# Patient Record
Sex: Female | Born: 1990 | Race: Black or African American | Hispanic: No | Marital: Single | Smoking: Never smoker
Health system: Southern US, Community
[De-identification: ages and names within clinical notes are randomized; demographics above are authoritative.]

## PROBLEM LIST (undated history)

## (undated) ENCOUNTER — Inpatient Hospital Stay (HOSPITAL_COMMUNITY): Payer: Self-pay

## (undated) DIAGNOSIS — I10 Essential (primary) hypertension: Secondary | ICD-10-CM

## (undated) DIAGNOSIS — N186 End stage renal disease: Secondary | ICD-10-CM

## (undated) DIAGNOSIS — I5020 Unspecified systolic (congestive) heart failure: Secondary | ICD-10-CM

## (undated) DIAGNOSIS — F32A Depression, unspecified: Secondary | ICD-10-CM

## (undated) DIAGNOSIS — I428 Other cardiomyopathies: Secondary | ICD-10-CM

## (undated) DIAGNOSIS — A549 Gonococcal infection, unspecified: Secondary | ICD-10-CM

## (undated) DIAGNOSIS — E1110 Type 2 diabetes mellitus with ketoacidosis without coma: Secondary | ICD-10-CM

## (undated) DIAGNOSIS — R197 Diarrhea, unspecified: Secondary | ICD-10-CM

## (undated) DIAGNOSIS — R569 Unspecified convulsions: Secondary | ICD-10-CM

## (undated) DIAGNOSIS — F319 Bipolar disorder, unspecified: Secondary | ICD-10-CM

## (undated) SURGERY — Surgical Case
Anesthesia: *Unknown

---
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
## 2012-02-07 ENCOUNTER — Encounter (HOSPITAL_COMMUNITY): Payer: Self-pay

## 2012-02-07 ENCOUNTER — Inpatient Hospital Stay (HOSPITAL_COMMUNITY)
Admission: AD | Admit: 2012-02-07 | Discharge: 2012-02-07 | Disposition: A | Discharge status: Home / Self Care | Payer: Self-pay | Source: Ambulatory Visit | Attending: Obstetrics & Gynecology | Admitting: Obstetrics & Gynecology

## 2012-02-07 DIAGNOSIS — Z3202 Encounter for pregnancy test, result negative: Secondary | ICD-10-CM | POA: Insufficient documentation

## 2012-02-07 LAB — POCT PREGNANCY, URINE: Preg Test, Ur: NEGATIVE

## 2012-02-07 NOTE — MAU Note (Signed)
Patient presents for pregnancy test LMP 12/19/11.

## 2012-02-07 NOTE — MAU Provider Note (Signed)
S:  21 y.o. G1P0010 presents to MAU for pregnancy test.  Pt LMP was at the end of August but she did not have a period in September.  She denies any vaginal bleeding, pain, n/v, or fever chills.    O: BP 128/75  Pulse 121  Temp 98.1 F (36.7 C)  Resp 18  Ht 5\' 2"  (1.575 m)  Wt 56.065 kg (123 lb 9.6 oz)  BMI 22.61 kg/m2  Breastfeeding? Unknown  A: Negative pregnancy test  P: Reviewed negative pregnancy test with pt D/C home Return to MAU if abdominal pain, n/v, or fever/chills.   Recommend gyn follow up for irregular menses, discussed gyn clinic with pt today Recommend f/u with primary care for diabetes management, other health care needs  Fatima Blank Certified Nurse-Midwife

## 2012-02-12 NOTE — MAU Provider Note (Signed)
Attestation of Attending Supervision of Advanced Practitioner (CNM/NP): Evaluation and management procedures were performed by the Advanced Practitioner under my supervision and collaboration.  I have reviewed the Advanced Practitioner's note and chart, and I agree with the management and plan.  Airanna Partin, MD, FACOG Attending Obstetrician & Gynecologist Faculty Practice, [facility]  

## 2012-02-18 ENCOUNTER — Emergency Department (HOSPITAL_COMMUNITY): Payer: Self-pay

## 2012-02-18 ENCOUNTER — Inpatient Hospital Stay (HOSPITAL_COMMUNITY)
Admission: EM | Admit: 2012-02-18 | Discharge: 2012-02-20 | DRG: 638 | Disposition: A | Discharge status: Home / Self Care | Payer: MEDICAID | Attending: Internal Medicine | Admitting: Internal Medicine

## 2012-02-18 ENCOUNTER — Encounter (HOSPITAL_COMMUNITY): Payer: Self-pay | Admitting: *Deleted

## 2012-02-18 DIAGNOSIS — Z91199 Patient's noncompliance with other medical treatment and regimen due to unspecified reason: Secondary | ICD-10-CM

## 2012-02-18 DIAGNOSIS — D649 Anemia, unspecified: Secondary | ICD-10-CM | POA: Diagnosis present

## 2012-02-18 DIAGNOSIS — R739 Hyperglycemia, unspecified: Secondary | ICD-10-CM

## 2012-02-18 DIAGNOSIS — N289 Disorder of kidney and ureter, unspecified: Secondary | ICD-10-CM

## 2012-02-18 DIAGNOSIS — E108 Type 1 diabetes mellitus with unspecified complications: Secondary | ICD-10-CM | POA: Diagnosis present

## 2012-02-18 DIAGNOSIS — E872 Acidosis, unspecified: Secondary | ICD-10-CM | POA: Diagnosis present

## 2012-02-18 DIAGNOSIS — E109 Type 1 diabetes mellitus without complications: Secondary | ICD-10-CM

## 2012-02-18 DIAGNOSIS — Z9119 Patient's noncompliance with other medical treatment and regimen: Secondary | ICD-10-CM

## 2012-02-18 DIAGNOSIS — D72829 Elevated white blood cell count, unspecified: Secondary | ICD-10-CM | POA: Diagnosis present

## 2012-02-18 DIAGNOSIS — E1110 Type 2 diabetes mellitus with ketoacidosis without coma: Secondary | ICD-10-CM | POA: Diagnosis present

## 2012-02-18 DIAGNOSIS — E1010 Type 1 diabetes mellitus with ketoacidosis without coma: Principal | ICD-10-CM | POA: Diagnosis present

## 2012-02-18 DIAGNOSIS — E875 Hyperkalemia: Secondary | ICD-10-CM | POA: Diagnosis present

## 2012-02-18 LAB — BASIC METABOLIC PANEL
BUN: 22 mg/dL (ref 6–23)
BUN: 21 mg/dL (ref 6–23)
CO2: <7 mEq/L — CL (ref 19–32)
CO2: <7 mEq/L — CL (ref 19–32)
Calcium: 10 mg/dL (ref 8.4–10.5)
Creatinine, Ser: 0.78 mg/dL (ref 0.50–1.10)
Creatinine, Ser: 0.83 mg/dL (ref 0.50–1.10)
GFR calc Af Amer: >90 mL/min (ref >90)
GFR calc non Af Amer: >90 mL/min (ref >90)
Glucose, Bld: 821 mg/dL (ref 70–99)
Glucose, Bld: 484 mg/dL — ABNORMAL HIGH (ref 70–99)
Potassium: 6.2 mEq/L — ABNORMAL HIGH (ref 3.5–5.1)

## 2012-02-18 LAB — URINALYSIS, ROUTINE W REFLEX MICROSCOPIC
Bilirubin Urine: NEGATIVE
Glucose, UA: >1000 mg/dL — AB
Ketones, ur: >80 mg/dL — AB
Leukocytes, UA: NEGATIVE
Nitrite: NEGATIVE
Specific Gravity, Urine: 1.029 (ref 1.005–1.030)
Urobilinogen, UA: 0.2 mg/dL (ref 0.0–1.0)
pH: 5.5 (ref 5.0–8.0)

## 2012-02-18 LAB — CBC WITH DIFFERENTIAL/PLATELET
Basophils Relative: 1 % (ref 0–1)
Eosinophils Absolute: 0 10*3/uL (ref 0.0–0.7)
Eosinophils Relative: 0 % (ref 0–5)
Hemoglobin: 11.2 g/dL — ABNORMAL LOW (ref 12.0–15.0)
Lymphs Abs: 2.7 10*3/uL (ref 0.7–4.0)
MCH: 25.6 pg — ABNORMAL LOW (ref 26.0–34.0)
MCHC: 33.1 g/dL (ref 30.0–36.0)
Monocytes Absolute: 1.6 10*3/uL — ABNORMAL HIGH (ref 0.1–1.0)
Neutro Abs: 18.4 10*3/uL — ABNORMAL HIGH (ref 1.7–7.7)
Neutrophils Relative %: 80 % — ABNORMAL HIGH (ref 43–77)
RDW: 14.2 % (ref 11.5–15.5)

## 2012-02-18 LAB — BLOOD GAS, ARTERIAL
Bicarbonate: 3.1 mEq/L — ABNORMAL LOW (ref 20.0–24.0)
Drawn by: 257701
O2 Saturation: 95.9 %
Patient temperature: 98.6
TCO2: 3 mmol/L (ref 0–100)
pH, Arterial: 7.071 — CL (ref 7.350–7.450)

## 2012-02-18 LAB — GLUCOSE, CAPILLARY
Glucose-Capillary: 583 mg/dL (ref 70–99)
Glucose-Capillary: 387 mg/dL — ABNORMAL HIGH (ref 70–99)
Glucose-Capillary: 320 mg/dL — ABNORMAL HIGH (ref 70–99)

## 2012-02-18 LAB — RAPID URINE DRUG SCREEN, HOSP PERFORMED
Benzodiazepines: NOT DETECTED
Cocaine: NOT DETECTED
Opiates: POSITIVE — AB

## 2012-02-18 LAB — URINE MICROSCOPIC-ADD ON

## 2012-02-18 LAB — D-DIMER, QUANTITATIVE (NOT AT ARMC): D-Dimer, Quant: <0.27 ug/mL-FEU (ref 0.00–0.48)

## 2012-02-18 MED ORDER — SODIUM CHLORIDE 0.9 % IV BOLUS (SEPSIS)
1000.0000 mL | Freq: Once | INTRAVENOUS | Status: AC
  Administered 2012-02-18: 1000 mL via INTRAVENOUS

## 2012-02-18 MED ORDER — SODIUM CHLORIDE 0.9 % IV SOLN
INTRAVENOUS | Status: DC
  Administered 2012-02-18: 8.8 [IU]/h via INTRAVENOUS
  Administered 2012-02-18: 10.5 [IU]/h via INTRAVENOUS
  Administered 2012-02-18: 5.4 [IU]/h via INTRAVENOUS
  Administered 2012-02-18: 7.5 [IU]/h via INTRAVENOUS
  Administered 2012-02-18: 6.5 [IU]/h via INTRAVENOUS
  Administered 2012-02-19: 4.3 [IU]/h via INTRAVENOUS
  Administered 2012-02-19: 0.6 [IU]/h via INTRAVENOUS
  Administered 2012-02-19: 2.6 [IU]/h via INTRAVENOUS
  Filled 2012-02-18: qty 1

## 2012-02-18 MED ORDER — SODIUM CHLORIDE 0.9 % IJ SOLN: 3.0000 mL | Freq: Two times a day (BID) | INTRAMUSCULAR | Status: DC

## 2012-02-18 MED ORDER — SODIUM CHLORIDE 0.9 % IV SOLN
1000.0000 mL | Freq: Once | INTRAVENOUS | Status: AC
  Administered 2012-02-18: 1000 mL via INTRAVENOUS

## 2012-02-18 MED ORDER — ENOXAPARIN SODIUM 40 MG/0.4ML ~~LOC~~ SOLN
40.0000 mg | Freq: Every day | SUBCUTANEOUS | Status: DC
  Administered 2012-02-19 (×2): 40 mg via SUBCUTANEOUS
  Filled 2012-02-18 (×3): qty 0.4

## 2012-02-18 MED ORDER — SODIUM CHLORIDE 0.9 % IV SOLN
1000.0000 mL | INTRAVENOUS | Status: DC
  Administered 2012-02-18: 125 mL/h via INTRAVENOUS
  Administered 2012-02-19: 1000 mL via INTRAVENOUS

## 2012-02-18 MED ORDER — ONDANSETRON HCL 4 MG/2ML IJ SOLN: 4.0000 mg | Freq: Four times a day (QID) | INTRAMUSCULAR | Status: DC | PRN

## 2012-02-18 MED ORDER — MORPHINE SULFATE 4 MG/ML IJ SOLN
4.0000 mg | Freq: Once | INTRAMUSCULAR | Status: AC
  Administered 2012-02-18: 4 mg via INTRAVENOUS
  Filled 2012-02-18: qty 1

## 2012-02-18 MED ORDER — INFLUENZA VIRUS VACC SPLIT PF IM SUSP
0.5000 mL | INTRAMUSCULAR | Status: AC
  Administered 2012-02-19: 0.5 mL via INTRAMUSCULAR
  Filled 2012-02-18: qty 0.5

## 2012-02-18 MED ORDER — STERILE WATER FOR INJECTION IV SOLN
INTRAVENOUS | Status: DC
  Administered 2012-02-19: via INTRAVENOUS
  Filled 2012-02-18: qty 9.7

## 2012-02-18 MED ORDER — ONDANSETRON HCL 4 MG PO TABS: 4.0000 mg | ORAL_TABLET | Freq: Four times a day (QID) | ORAL | Status: DC | PRN

## 2012-02-18 NOTE — ED Notes (Signed)
[id number] Expected date:<BR> Expected time:<BR> Means of arrival:<BR> Comments:<BR> hyperglycemic

## 2012-02-18 NOTE — ED Notes (Signed)
Took patient to the bathroom for urine sample she stated that she could not use the restroom because she already used it.

## 2012-02-18 NOTE — ED Provider Notes (Signed)
History     CSN: [id number] Arrival date & time 02/18/12  1444 First MD Initiated Contact with Patient 02/18/12 1547      Chief Complaint  Patient presents with  . Hyperglycemia    HPI Pt has history of diabetes.  She ran out her medications and last took her dose yesterday.  Pt started having trouble with nausea and feeling lightheaded.  She has had polyuria and has been feeling real thirsty so she started drinking ginger ale.  She is having pain in her chest now. She feels short of breath and it hurts to breathe.  The pain is sharp in nature.  No fever or cough.  No history of DVT or PE.  No history of heart problems.  She is very tearful and upset.  Pt often feels this way when her blood sugar gets high.  She denies pregnancy at this time.    Past Medical History  Diagnosis Date  . Gonorrhea 08/2011    Not treated  . Diabetes mellitus 2001    Diagnosed at age 39 ; Type I    Past Surgical History  Procedure Date  . No past surgeries     Family History  Problem Relation Age of Onset  . Anesthesia problems Neg Hx   . Asthma Mother   . Gout Father   . Diabetes Paternal Grandmother     History  Substance Use Topics  . Smoking status: Never Smoker   . Smokeless tobacco: Never Used  . Alcohol Use: No    OB History    Grav Para Term Preterm Abortions TAB SAB Ect Mult Living   1 0   1  1   0      Review of Systems  Genitourinary: Negative for vaginal bleeding.  All other systems reviewed and are negative.    Allergies  Penicillins  Home Medications   Current Outpatient Rx  Name Route Sig Dispense Refill  . ACETONE (URINE) TEST VI STRP Does not apply 1 strip by Does not apply route as needed. 25 each 2  . GLUCOSE BLOOD VI STRP  Use as instructed 100 each 12  . INSULIN ASPART 100 UNIT/ML [place] SOLN Subcutaneous Inject 3-10 Units into the skin 3 (three) times daily with meals. Sliding scale    . INSULIN ISOPHANE HUMAN 100 UNIT/ML [place] SUSP Subcutaneous Inject 20  Units into the skin 2 (two) times daily. Inject 20 units before breakfast and dinner      BP 141/98  Pulse 137  Temp 98 F (36.7 C) (Oral)  Resp 20  SpO2 100%  Breastfeeding? Unknown  Physical Exam  Nursing note and vitals reviewed. Constitutional: She appears well-developed and well-nourished. She appears distressed.       tearful  HENT:  Head: Normocephalic and atraumatic.  Right Ear: External ear normal.  Left Ear: External ear normal.  Mouth/Throat: No oropharyngeal exudate (dry mm).  Eyes: Conjunctivae normal are normal. Right eye exhibits no discharge. Left eye exhibits no discharge. No scleral icterus.  Neck: Neck supple. No tracheal deviation present.  Cardiovascular: Regular rhythm and intact distal pulses.  Tachycardia present.   Pulmonary/Chest: Effort normal and breath sounds normal. No stridor. No respiratory distress. She has no wheezes. She has no rales. She exhibits tenderness (right sided).  Abdominal: Soft. Bowel sounds are normal. She exhibits no distension. There is no tenderness. There is no rebound and no guarding.  Musculoskeletal: She exhibits no edema and no tenderness.  Neurological: She is alert.  She has normal strength. No sensory deficit. Cranial nerve deficit: no gross deficits. She exhibits normal muscle tone. She displays no seizure activity. Coordination normal.  Skin: Skin is warm and dry. No rash noted.  Psychiatric: She has a normal mood and affect.    ED Course  Procedures (including critical care time)  Rate: 124  Rhythm: Sinus tachycardia  QRS Axis: normal  Intervals: normal  ST/T Wave abnormalities: normal  Conduction Disutrbances:none  Narrative Interpretation: Rate faster but prior EKG showed a heart rate of 146  Old EKG Reviewed: Rate slower  Medications  insulin aspart (NOVOLOG) 100 UNIT/ML injection (not administered)  insulin NPH (HUMULIN N,NOVOLIN N) 100 UNIT/ML injection (not administered)  0.9 %  sodium chloride infusion (0  mL Intravenous Stopped 02/18/12 1801)    Followed by  0.9 %  sodium chloride infusion (0 mL Intravenous Stopped 02/18/12 1814)    Followed by  0.9 %  sodium chloride infusion (125 mL/hr Intravenous New Bag/Given 02/18/12 1815)  insulin regular (NOVOLIN R,HUMULIN R) 1 Units/mL in sodium chloride 0.9 % 100 mL infusion (not administered)  morphine 4 MG/ML injection 4 mg (4 mg Intravenous Given 02/18/12 1637)   CRITICAL CARE Performed by: Dorie Rank R Total critical care time: 45 Critical care time was exclusive of separately billable procedures and treating other patients. Critical care was necessary to treat or prevent imminent or life-threatening deterioration. Critical care was time spent personally by me on the following activities: development of treatment plan with patient and/or surrogate as well as nursing, discussions with consultants, evaluation of patient's response to treatment, examination of patient, obtaining history from patient or surrogate, ordering and performing treatments and interventions, ordering and review of laboratory studies, ordering and review of radiographic studies, pulse oximetry and re-evaluation of patient's condition.  Labs Reviewed  URINALYSIS, ROUTINE W REFLEX MICROSCOPIC - Abnormal; Notable for the following:    APPearance CLOUDY (*)     Glucose, UA >1000 (*)     Ketones, ur >80 (*)     Protein, ur 30 (*)     All other components within normal limits  GLUCOSE, CAPILLARY - Abnormal; Notable for the following:    Glucose-Capillary >600 (*)     All other components within normal limits  BLOOD GAS, ARTERIAL - Abnormal; Notable for the following:    pH, Arterial 7.071 (*)     pCO2 arterial 11.1 (*)     pO2, Arterial 125.0 (*)     Bicarbonate 3.1 (*)     Acid-base deficit 27.1 (*)     All other components within normal limits  BASIC METABOLIC PANEL - Abnormal; Notable for the following:    Potassium 6.2 (*)  NO VISIBLE HEMOLYSIS   Chloride 94 (*)      CO2 <7 (*)     Glucose, Bld 821 (*)     All other components within normal limits  URINE MICROSCOPIC-ADD ON - Abnormal; Notable for the following:    Squamous Epithelial / LPF MANY (*)     All other components within normal limits  D-DIMER, QUANTITATIVE  TROPONIN I  POCT PREGNANCY, URINE  CBC  COMPREHENSIVE METABOLIC PANEL  CBC WITH DIFFERENTIAL   Dg Chest 2 View  02/18/2012  *RADIOLOGY REPORT*  Clinical Data: Hyperglycemia.  Weakness.  Vomiting.  CHEST - 2 VIEW  Comparison: 10/17/2011.  Findings:  Cardiopericardial silhouette within normal limits. Mediastinal contours normal. Trachea midline.  No airspace disease or effusion.  IMPRESSION: No active cardiopulmonary disease.   Original Report  Authenticated By: Dereck Ligas, M.D.     1. DKA (diabetic ketoacidosis)     MDM  The patient appears to have recurrent diabetic ketoacidosis. Patient has not had her medications in the last day and I wonder if she has had further noncompliance. At this time there doesn't appear to be any evidence of an acute infection precipitating her illness. She has improved somewhat with IV fluids. I have ordered a insulin infusion. She is hyperkalemic and acidotic and I will treat with fluids and insulin infusion. I discussed the case with Dr. Jamal Collin regarding admission to the ICU. He will consult with the hospitalist service regarding who will be the primary admitting service.  7:27 PM and explained all these findings to the patient. She understands.  She remains tachycardic blood pressure remains normal       Kathalene Frames, MD 02/18/12 1927

## 2012-02-18 NOTE — ED Notes (Signed)
Patient reports she ran out of insulin this morning, states she is feeling anxious.

## 2012-02-18 NOTE — ED Notes (Signed)
Patient was getting her blood redrawn and then went to xray will do EKG when she returns

## 2012-02-18 NOTE — ED Notes (Signed)
Lab called to say that specimen is hemolyzed. Phlebotomy at bedsite to redraw.

## 2012-02-18 NOTE — ED Notes (Signed)
Pt in from home by ems. Pt unable to afford insulin shots, received last insulin around 4pm yesterday. Pt c/o nausea, generalized body pain. Pt did report to ems that she is [redacted]wks pregnant.

## 2012-02-19 DIAGNOSIS — E1110 Type 2 diabetes mellitus with ketoacidosis without coma: Secondary | ICD-10-CM | POA: Diagnosis present

## 2012-02-19 DIAGNOSIS — E872 Acidosis, unspecified: Secondary | ICD-10-CM | POA: Diagnosis present

## 2012-02-19 DIAGNOSIS — D72829 Elevated white blood cell count, unspecified: Secondary | ICD-10-CM | POA: Diagnosis present

## 2012-02-19 DIAGNOSIS — E875 Hyperkalemia: Secondary | ICD-10-CM | POA: Diagnosis present

## 2012-02-19 DIAGNOSIS — D649 Anemia, unspecified: Secondary | ICD-10-CM

## 2012-02-19 DIAGNOSIS — E109 Type 1 diabetes mellitus without complications: Secondary | ICD-10-CM

## 2012-02-19 DIAGNOSIS — N289 Disorder of kidney and ureter, unspecified: Secondary | ICD-10-CM

## 2012-02-19 HISTORY — DX: Anemia, unspecified: D64.9

## 2012-02-19 LAB — BASIC METABOLIC PANEL
BUN: 13 mg/dL (ref 6–23)
CO2: 17 mEq/L — ABNORMAL LOW (ref 19–32)
CO2: 16 mEq/L — ABNORMAL LOW (ref 19–32)
Calcium: 8.8 mg/dL (ref 8.4–10.5)
Calcium: 8.4 mg/dL (ref 8.4–10.5)
Chloride: 107 mEq/L (ref 96–112)
Creatinine, Ser: 0.56 mg/dL (ref 0.50–1.10)
Creatinine, Ser: 0.55 mg/dL (ref 0.50–1.10)
Creatinine, Ser: 0.48 mg/dL — ABNORMAL LOW (ref 0.50–1.10)
GFR calc Af Amer: >90 mL/min (ref >90)
GFR calc Af Amer: >90 mL/min (ref >90)
GFR calc Af Amer: >90 mL/min (ref >90)
GFR calc Af Amer: >90 mL/min (ref >90)
GFR calc non Af Amer: >90 mL/min (ref >90)
GFR calc non Af Amer: >90 mL/min (ref >90)
GFR calc non Af Amer: >90 mL/min (ref >90)
GFR calc non Af Amer: >90 mL/min (ref >90)
Glucose, Bld: 155 mg/dL — ABNORMAL HIGH (ref 70–99)
Glucose, Bld: 218 mg/dL — ABNORMAL HIGH (ref 70–99)
Glucose, Bld: 231 mg/dL — ABNORMAL HIGH (ref 70–99)
Potassium: 4.5 mEq/L (ref 3.5–5.1)
Potassium: 3.7 mEq/L (ref 3.5–5.1)
Sodium: 139 mEq/L (ref 135–145)
Sodium: 138 mEq/L (ref 135–145)
Sodium: 136 mEq/L (ref 135–145)
Sodium: 131 mEq/L — ABNORMAL LOW (ref 135–145)

## 2012-02-19 LAB — GLUCOSE, CAPILLARY
Glucose-Capillary: 136 mg/dL — ABNORMAL HIGH (ref 70–99)
Glucose-Capillary: 137 mg/dL — ABNORMAL HIGH (ref 70–99)
Glucose-Capillary: 139 mg/dL — ABNORMAL HIGH (ref 70–99)
Glucose-Capillary: 147 mg/dL — ABNORMAL HIGH (ref 70–99)
Glucose-Capillary: 171 mg/dL — ABNORMAL HIGH (ref 70–99)
Glucose-Capillary: 181 mg/dL — ABNORMAL HIGH (ref 70–99)
Glucose-Capillary: 202 mg/dL — ABNORMAL HIGH (ref 70–99)
Glucose-Capillary: 211 mg/dL — ABNORMAL HIGH (ref 70–99)
Glucose-Capillary: 218 mg/dL — ABNORMAL HIGH (ref 70–99)
Glucose-Capillary: 399 mg/dL — ABNORMAL HIGH (ref 70–99)

## 2012-02-19 MED ORDER — INSULIN NPH (HUMAN) (ISOPHANE) 100 UNIT/ML ~~LOC~~ SUSP
20.0000 [IU] | Freq: Two times a day (BID) | SUBCUTANEOUS | Status: DC
  Administered 2012-02-19 – 2012-02-20 (×3): 20 [IU] via SUBCUTANEOUS
  Filled 2012-02-19: qty 10

## 2012-02-19 MED ORDER — DEXTROSE-NACL 5-0.9 % IV SOLN: INTRAVENOUS | Status: DC

## 2012-02-19 MED ORDER — INSULIN ASPART 100 UNIT/ML ~~LOC~~ SOLN
0.0000 [IU] | Freq: Three times a day (TID) | SUBCUTANEOUS | Status: DC
  Administered 2012-02-19: 9 [IU] via SUBCUTANEOUS
  Administered 2012-02-20: 3 [IU] via SUBCUTANEOUS
  Administered 2012-02-20: 1 [IU] via SUBCUTANEOUS

## 2012-02-19 MED ORDER — SODIUM CHLORIDE 0.9 % IV SOLN
INTRAVENOUS | Status: DC
  Administered 2012-02-19 – 2012-02-20 (×3): via INTRAVENOUS

## 2012-02-19 MED ORDER — CHLORHEXIDINE GLUCONATE 0.12 % MT SOLN: 15.0000 mL | Freq: Two times a day (BID) | OROMUCOSAL | Status: DC

## 2012-02-19 MED ORDER — BIOTENE DRY MOUTH MT LIQD
15.0000 mL | Freq: Two times a day (BID) | OROMUCOSAL | Status: DC
  Administered 2012-02-19: 15 mL via OROMUCOSAL

## 2012-02-19 MED ORDER — DEXTROSE-NACL 5-0.45 % IV SOLN
INTRAVENOUS | Status: DC
  Administered 2012-02-19: 125 mL/h via INTRAVENOUS
  Administered 2012-02-19: 10:00:00 via INTRAVENOUS

## 2012-02-19 NOTE — Progress Notes (Signed)
Attempted to assess patient, but she was bathing.  Will defer until tomorrow.  Maley Venezia S. Aureliano Oshields, RN, CNS, CDE

## 2012-02-19 NOTE — H&P (Signed)
[facility] History and Physical  NOLIE KIMAK [id number] DOB: 01-24-1991 DOA: 02/18/2012  Referring physician: Dr Alva Garnet PCP: Cresenciano Genre, MD  Specialists: None  Chief Complaint: nausea, vomiting, dizziness.  HPI: Ashley Freeman is a 21 y.o. female with prior h/o Insulin Dependent Diabetes, h/o non compliance to medications, h/o repeated admissions for DKA, came in today complaining of nausea, vomiting and dizziness . On arrival to ED she was found to be in DKA, with blood sugar in 99991111, metabolic acidosis, tachycardic. She denies fever, but reports occasional short ness of breath. She denies any chest pain.  She was initially referred to critical care physician, who felt she can be on hospitalist service for management of DKA. She is a patient of IM teaching service at [facility] to Dr Obie Dredge on call for IM teaching service to transfer the patient to [facility], but patient adamantly refused to go to [facility], hence she is being admitted to step down under hospitalist service for management of DKA.    Review of Systems: The patient denies anorexia, fever, weight loss,, vision loss, decreased hearing, hoarseness, chest pain, syncope, dyspnea on exertion, peripheral edema, balance deficits, hemoptysis, abdominal pain, melena, hematochezia, severe indigestion/heartburn, hematuria, incontinence, genital sores, muscle weakness, suspicious skin lesions, transient blindness, difficulty walking, depression, unusual weight change, abnormal bleeding, enlarged lymph nodes, angioedema, and breast masses.    Past Medical History  Diagnosis Date  . Gonorrhea 08/2011    Not treated  . Diabetes mellitus 2001    Diagnosed at age 19 ; Type I   Past Surgical History  Procedure Date  . No past surgeries    Social History:  reports that she has never smoked. She has never used smokeless tobacco. She reports that she does not drink alcohol or use illicit drugs.  where does  patient live--home,  Allergies  Allergen Reactions  . Penicillins Hives    Family History  Problem Relation Age of Onset  . Anesthesia problems Neg Hx   . Asthma Mother   . Gout Father   . Diabetes Paternal Grandmother     Prior to Admission medications   Medication Sig Start Date End Date Taking? Authorizing Provider  acetone, urine, test strip 1 strip by Does not apply route as needed. 11/07/11  Yes Cresenciano Genre, MD  glucose blood (ACCU-CHEK INSTANT GLUCOSE TEST) test strip Use as instructed 11/07/11 11/06/12 Yes Cresenciano Genre, MD  insulin aspart (NOVOLOG) 100 UNIT/ML injection Inject 3-10 Units into the skin 3 (three) times daily with meals. Sliding scale 10/22/11 10/21/12 Yes Ivor Costa, MD  insulin NPH (HUMULIN N,NOVOLIN N) 100 UNIT/ML injection Inject 20 Units into the skin 2 (two) times daily. Inject 20 units before breakfast and dinner 11/06/11  Yes Bartholomew Crews, MD   Physical Exam: Filed Vitals:   02/18/12 2130 02/18/12 2215 02/18/12 2300 02/18/12 2345  BP: 138/89 130/88 125/82 125/71  Pulse: 149 148 131 130  Temp:    99 F (37.2 C)  TempSrc:    Oral  Resp: 31 30 22 28   Height:    5\' 2"  (1.575 m)  Weight:    54.9 kg (121 lb 0.5 oz)  SpO2: 100% 100% 100% 100%    Constitutional: Vital signs reviewed.  Patient is poorly nourished in mild distress and cooperative with exam. Alert and oriented x3.  Head: Normocephalic and atraumatic Mouth: no erythema or exudates, dry MM Eyes: PERRL, EOMI, conjunctivae normal, No scleral icterus.  Neck: Supple, Trachea  midline normal ROM, No JVD, mass, thyromegaly, or carotid bruit present.  Cardiovascular: tachycardic,  S1 normal, S2 normal, no MRG, pulses symmetric and intact bilaterally Pulmonary/Chest: CTAB, no wheezes, rales, or rhonchi Abdominal: Soft. Non-tender, non-distended, bowel sounds are normal, no masses, organomegaly, or guarding present.  Musculoskeletal: No joint deformities, erythema, or stiffness, ROM full and no  nontender Neurological: A&O x3, Strength is normal and symmetric bilaterally, cranial nerve II-XII are grossly intact, no focal motor deficit, sensory intact to light touch bilaterally.  Skin: Warm, dry and intact. No rash, cyanosis, or clubbing.  Psychiatric: Normal mood and affect.   Labs on Admission:  Basic Metabolic Panel:  Lab 0000000 2222 02/18/12 1752  NA 143 135  K 5.4* 6.2*  CL 104 94*  CO2 <7* <7*  GLUCOSE 484* 821*  BUN 21 22  CREATININE 0.83 0.78  CALCIUM 10.0 10.2  MG -- --  PHOS -- --   Liver Function Tests: No results found for this basename: AST:5,ALT:5,ALKPHOS:5,BILITOT:5,PROT:5,ALBUMIN:5 in the last 168 hours No results found for this basename: LIPASE:5,AMYLASE:5 in the last 168 hours No results found for this basename: AMMONIA:5 in the last 168 hours CBC:  Lab 02/18/12 1752  WBC 22.9*  NEUTROABS 18.4*  HGB 11.2*  HCT 43.5  MCV 85.8  PLT 458*   Cardiac Enzymes:  Lab 02/18/12 1752  CKTOTAL --  CKMB --  CKMBINDEX --  TROPONINI <0.30    BNP (last 3 results) No results found for this basename: PROBNP:3 in the last 8760 hours CBG:  Lab 02/18/12 2325 02/18/12 2229 02/18/12 2128 02/18/12 2025 02/18/12 1504  GLUCAP 320* 387* 498* 583* >600*    Radiological Exams on Admission: Dg Chest 2 View  02/18/2012  *RADIOLOGY REPORT*  Clinical Data: Hyperglycemia.  Weakness.  Vomiting.  CHEST - 2 VIEW  Comparison: 10/17/2011.  Findings:  Cardiopericardial silhouette within normal limits. Mediastinal contours normal. Trachea midline.  No airspace disease or effusion.  IMPRESSION: No active cardiopulmonary disease.   Original Report Authenticated By: Dereck Ligas, M.D.     EKG: sinus tachycardia.  Assessment/Plan Active Problems: 1. DKA: - admit to step down - started the patient on insulin gtt. - on IV fluids and IV sodium bicarbonate for metabolic acidosis.  -  Keep K>4. - clear liquid diet - Q8hr BMP  2. Hyperkalemia: hemolysed sample Repeat  BMP in am.   3. Leukocytosis: probably secondary to dehydration vs stress margination. UA negative for infection. CXR negative for pneumonia. No indication for antibiotics.   4. Anemia , normocytic: anemia panel ordered.   5. Thrombocytosis:  - probably reactive.   6. DVT prophylaxis   Code Status: full code Family Communication: none at bedside Disposition Plan: 2 to 3 days.   Time spent: 84 minutes.  [facility] Pager [phone]  If 7PM-7AM, please contact night-coverage [email or web address] Password [email or web address] 02/19/2012, 12:09 AM

## 2012-02-19 NOTE — Progress Notes (Signed)
Pt transferred to 1414 with tech in wheelchair.

## 2012-02-19 NOTE — Progress Notes (Signed)
Patient ID: Ashley Freeman, female   DOB: 23-Jul-1990, 21 y.o.   MRN: [id number]  [facility] PROGRESS NOTE  Chadae Marina [facility] [id number] DOB: 1991-02-01 DOA: 02/18/2012 PCP: Cresenciano Genre, MD  Brief narrative: Pt is 21 y.o. female with prior h/o Insulin Dependent Diabetes, non compliance to medications, repeated admissions for DKA, who presented to [facility] ED with main concern of nausea, vomiting and dizziness . On arrival to ED she was found to be in DKA, with blood sugar in 99991111, metabolic acidosis, tachycardic.  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 - CBG's better controlled - will be able to d/c insulin drip and transition to subq insulin - will also initiate home insulin regimen - diabetic coordinator consult to address noncompliance  Leukocytosis - unclear etiology and I can not find clear infectious etiology and this could be related to stress and demargination - pt denies any shortness of breath or cough, no urinary concerns - will repeat CBC in AM  Hyperkalemia  - in the setting of metabolic acidosis - now resolved - BMP in AM  Anemia - of chronic disease - Hg and Hct are stable and at pt baseline  Consultants:  None  Procedures/Studies: Dg Chest 2 View 02/18/2012   IMPRESSION:   No active cardiopulmonary disease.     Antibiotics:  None  Code Status: Full Family Communication: Pt at bedside Disposition Plan: Home in 1-2 days if medically stable and electrolyte panel stable  HPI/Subjective: No events overnight.   Objective: Filed Vitals:   02/19/12 0800 02/19/12 0900 02/19/12  1000 02/19/12 1100  BP: 85/43 97/53 105/71 111/71  Pulse: 105 99 103 100  Temp: 98.4 F (36.9 C)     TempSrc: Oral     Resp: 19 19 18 19   Height:      Weight:      SpO2: 100% 99% 99% 100%    Intake/Output Summary (Last 24 hours) at 02/19/12 1204 Last data filed at 02/19/12 1100  Gross per 24 hour  Intake 2122.4 ml  Output   1000 ml  Net 1122.4 ml    Exam:   General:  Pt is alert, follows commands appropriately, not in acute distress  Cardiovascular: Regular rhythm, tachycardic S1/S2, no murmurs, no rubs, no gallops  Respiratory: Clear to auscultation bilaterally, no wheezing, no crackles, no rhonchi  Abdomen: Soft, non tender, non distended, bowel sounds present, no guarding  Extremities: No edema, pulses DP and PT palpable bilaterally  Neuro: Grossly nonfocal  Data Reviewed: Basic Metabolic Panel:  Lab [id number] 02/19/12 0625 02/19/12 0215 02/18/12 2222 02/18/12 1752  NA 136 138 139 143 135  K 3.9 3.5 4.5 5.4* 6.2*  CL 104 107 107 104 94*  CO2 16* 17* 13* <7* <7*  GLUCOSE 218* 132* 155* 484* 821*  BUN 10 10 13 21 22   CREATININE 0.55 0.56 0.55 0.83 0.78  CALCIUM 8.8 8.8 8.8 10.0 10.2  MG -- -- -- -- --  PHOS -- -- -- -- --   CBC:  Lab 02/18/12 1752  WBC 22.9*  NEUTROABS 18.4*  HGB 11.2*  HCT 43.5  MCV 85.8  PLT 458*  Cardiac Enzymes:  Lab 02/18/12 1752  CKTOTAL --  CKMB --  CKMBINDEX --  TROPONINI <0.30   CBG:  Lab 02/19/12 1111 02/19/12 0954 02/19/12 0848 02/19/12 0743 02/19/12 0652  GLUCAP 181* 211* 171* 143* 124*    Recent Results (from the past 240 hour(s))  MRSA PCR SCREENING     Status: Normal   Collection Time   02/18/12 11:44 PM      Component Value Range Status Comment   MRSA by PCR NEGATIVE  NEGATIVE Final      Scheduled Meds:   . sodium chloride  1,000 mL Intravenous Once   Followed by  . sodium chloride  1,000 mL Intravenous Once  . antiseptic oral rinse  15 mL Mouth Rinse q12n4p  . enoxaparin (LOVENOX) injection   40 mg Subcutaneous QHS  . influenza  inactive virus vaccine  0.5 mL Intramuscular Tomorrow-1000  . insulin aspart  0-9 Units Subcutaneous TID WC  . insulin NPH  20 Units Subcutaneous BID AC  .  morphine injection  4 mg Intravenous Once  . sodium chloride  1,000 mL Intravenous Once  . sodium chloride  3 mL Intravenous Q12H  . DISCONTD: chlorhexidine  15 mL Mouth Rinse BID   Continuous Infusions:   . insulin (NOVOLIN-R) infusion 0.6 Units/hr (02/19/12 0654)  . DISCONTD: sodium chloride 1,000 mL (02/19/12 0005)  . DISCONTD: dextrose 5 % and 0.45% NaCl 125 mL/hr at 02/19/12 1023  . DISCONTD: dextrose 5 % and 0.9% NaCl    . DISCONTD:  sodium bicarbonate infusion 1/4 NS 1000 mL 50 mL/hr at 02/19/12 0005     Faye Ramsay, MD  [facility] Pager [phone]  If 7PM-7AM, please contact night-coverage [email or web address] Password [email or web address] 02/19/2012, 12:04 PM   LOS: 1 day

## 2012-02-20 DIAGNOSIS — R7309 Other abnormal glucose: Secondary | ICD-10-CM

## 2012-02-20 DIAGNOSIS — E872 Acidosis: Secondary | ICD-10-CM

## 2012-02-20 LAB — BASIC METABOLIC PANEL
BUN: 7 mg/dL (ref 6–23)
CO2: 21 mEq/L (ref 19–32)
Chloride: 106 mEq/L (ref 96–112)
Creatinine, Ser: 0.47 mg/dL — ABNORMAL LOW (ref 0.50–1.10)
GFR calc Af Amer: >90 mL/min (ref >90)
Glucose, Bld: 159 mg/dL — ABNORMAL HIGH (ref 70–99)
Sodium: 137 mEq/L (ref 135–145)

## 2012-02-20 LAB — CBC
HCT: 30.4 % — ABNORMAL LOW (ref 36.0–46.0)
Hemoglobin: 10.5 g/dL — ABNORMAL LOW (ref 12.0–15.0)
MCV: 81.5 fL (ref 78.0–100.0)
Platelets: 281 10*3/uL (ref 150–400)
RBC: 3.73 MIL/uL — ABNORMAL LOW (ref 3.87–5.11)
RDW: 14.1 % (ref 11.5–15.5)
WBC: 10.3 10*3/uL (ref 4.0–10.5)

## 2012-02-20 LAB — HEMOGLOBIN A1C: Hgb A1c MFr Bld: >20 % — ABNORMAL HIGH (ref <5.7)

## 2012-02-20 LAB — GLUCOSE, CAPILLARY
Glucose-Capillary: 122 mg/dL — ABNORMAL HIGH (ref 70–99)
Glucose-Capillary: 205 mg/dL — ABNORMAL HIGH (ref 70–99)

## 2012-02-20 MED ORDER — INSULIN NPH (HUMAN) (ISOPHANE) 100 UNIT/ML ~~LOC~~ SUSP: 20.0000 [IU] | Freq: Two times a day (BID) | SUBCUTANEOUS | Status: DC

## 2012-02-20 MED ORDER — POTASSIUM CHLORIDE CRYS ER 20 MEQ PO TBCR
40.0000 meq | EXTENDED_RELEASE_TABLET | Freq: Once | ORAL | Status: AC
  Administered 2012-02-20: 40 meq via ORAL
  Filled 2012-02-20: qty 2

## 2012-02-20 MED ORDER — INSULIN ASPART 100 UNIT/ML ~~LOC~~ SOLN: 3.0000 [IU] | Freq: Three times a day (TID) | SUBCUTANEOUS | Status: DC

## 2012-02-20 NOTE — Care Management Note (Signed)
    Page 1 of 1   02/20/2012     1:36:10 PM   CARE MANAGEMENT NOTE 02/20/2012  Patient:  Ashley Freeman, Ashley Freeman   Account Number:  [id number]  Date Initiated:  02/20/2012  Documentation initiated by:  Dessa Phi  Subjective/Objective Assessment:   ADMITTED W/DKA     Action/Plan:   FROM HOME   Anticipated DC Date:  02/20/2012   Anticipated DC Plan:  [place]  CM consult  Medication Assistance  [facility]      Choice offered to / List presented to:             Status of service:  Completed, signed off Medicare Important Message given?   (If response is "NO", the following Medicare IM given date fields will be blank) Date Medicare IM given:   Date Additional Medicare IM given:    Discharge Disposition:  HOME/SELF CARE  Per UR Regulation:  Reviewed for med. necessity/level of care/duration of stay  If discussed at [place] of Stay Meetings, dates discussed:    Comments:  02/20/12 Tamer Baughman RN,BSN NCM [phone] PROVIDED W/COMMUNITY RESOURCES(PCP/[facility],[employer] $4 MED LIST)ALSO PROVIDED W/DISCOUNT XRIPT COUPON,FORM FOR PATIENT ASST PROGRAMS,& DISCOUNT CARD FOR NOVOLOG,& NOVOLIN.QUALIFIES FOR INDIGENT FUNDS IF NEEDED.INFORMED HER OF PHARMACY POLICY-3DY 0000000 USE FOR 74YR/NO NARCOTICS.PATIENT STATES SHE HAS INSULIN @ HOME,& CAN PURCHASE MED ON OWN.

## 2012-02-20 NOTE — Progress Notes (Signed)
D/C instructions with scripts & F/U given to pt who verbalizes understanding. Pt denies w/c and ambulates out of dept with friend. dph

## 2012-02-20 NOTE — Discharge Summary (Signed)
Physician Discharge Summary  Ashley Freeman [id number] DOB: 17-Mar-1991 DOA: 02/18/2012  PCP: Cresenciano Genre, MD  Admit date: 02/18/2012 Discharge date: 02/20/2012  Time spent: >30 minutes  Recommendations for Outpatient Follow-up:  1. Follow with PCP in 2 weeks (Close follow up to her diabetes and medication compliance/insulin dose adjustments; will also need BMET to follow on her electrolytes)  Discharge Diagnoses:  Active Problems:  Diabetes mellitus type 1 (uncontrolled)  DKA (diabetic ketoacidoses)  Metabolic acidosis  Leukocytosis  Anemia  Hyperkalemia   Discharge Condition: stable and improved. Patient will follow with PCP in 2 weeks. Advised to be compliant with her medications and diet; also not to skipped any meals or insulin shots.  Diet recommendation: low carbohydrates diet  Filed Weights   02/18/12 2345  Weight: 54.9 kg (121 lb 0.5 oz)    History of present illness:  Ashley Freeman is a 21 y.o. female with prior h/o Insulin Dependent Diabetes, h/o non compliance to medications, h/o repeated admissions for DKA, came in today complaining of nausea, vomiting and dizziness . On arrival to ED she was found to be in DKA, with blood sugar in [id number], metabolic acidosis, tachycardic. She denies fever, but reports occasional short ness of breath. She denies any chest pain. She was initially referred to critical care physician, who felt she can be on hospitalist service for management of DKA. She is a patient of IM teaching service at [facility] to Dr Obie Dredge on call for IM teaching service to transfer the patient to [facility], but patient adamantly refused to go to cone, hence she is being admitted to step down under hospitalist service for management of DKA.    Hospital Course:  DKA (diabetic ketoacidoses)  - secondary to medication noncompliance  - electrolytes and metabolic panel (anion gap and bicarb, WNL at discharge) - Patient w/o N/V or abdominal  discomfort and tolerate full diet. -Will go home with instructions to be compliant with medication and prescription to acquire her insulin at discharge. -Advise to follow with PCP and also outpatient diabetes coordinator  Diabetes mellitus type 1 (uncontrolled)   - CBG's now better using same home regimen  - Patient encourage to be compliant with her medications and low carb diet -Instructed to follow with PCP and outpatient diabetes coordinator.   Leukocytosis  - 2/2 to stress demargination due to DKA -No sings of infection identified. -Once her DKA resolved, her WBC's return to WNL.  Hyperkalemia  - in the setting of metabolic acidosis  - now resolved   Anemia  - Hg and Hct are stable and at pt baseline -Most likely secondary to ongoing menstrual cycles -Advised to take MV with iron  * rest of her medical problems remains stable and at this point the plan is to continue same medication regimen; patient will follow with PCP in 2 weeks for further evaluation and treatment.  Consultations:  none  Discharge Exam: Filed Vitals:   02/19/12 1456 02/19/12 2110 02/20/12 0636 02/20/12 1032  BP: 99/51 100/52 101/57 97/48  Pulse: 103 63 93 43  Temp: 98.7 F (37.1 C) 98.7 F (37.1 C) 97.3 F (36.3 C) 98.7 F (37.1 C)  TempSrc:  Oral Oral Oral  Resp: 20 20 18 18   Height:      Weight:      SpO2: 100% 98% 100% 100%    General: NAD, afebrile Cardiovascular: RRR, no rubs or gallops Respiratory: CTA bilaterally Abdomen: soft, NT, ND, positive BS Extremities: no edema or  cyanosis Neuro: non focal  Discharge Instructions  Discharge Orders    Future Orders Please Complete By Expires   Discharge instructions      Comments:   -Keep yourself well hydrated -Arrange follow up with PCP in 2 weeks -Take medications as prescribed -Do not skip any meal or insulin shots. -Follow a low carbohydrates diet       Medication List     As of 02/20/2012 12:16 PM    TAKE these  medications         acetone (urine) test strip   1 strip by Does not apply route as needed.      glucose blood test strip   Use as instructed      insulin aspart 100 UNIT/ML injection   Commonly known as: novoLOG   Inject 3-10 Units into the skin 3 (three) times daily with meals. Sliding scale      insulin NPH 100 UNIT/ML injection   Commonly known as: HUMULIN N,NOVOLIN N   Inject 20 Units into the skin 2 (two) times daily. Inject 20 units before breakfast and dinner           Follow-up Information    Follow up with Cresenciano Genre, MD. In 2 weeks.   Contact information:   [address] [place] [address] [phone]           The results of significant diagnostics from this hospitalization (including imaging, microbiology, ancillary and laboratory) are listed below for reference.    Significant Diagnostic Studies: Dg Chest 2 View  02/18/2012  *RADIOLOGY REPORT*  Clinical Data: Hyperglycemia.  Weakness.  Vomiting.  CHEST - 2 VIEW  Comparison: 10/17/2011.  Findings:  Cardiopericardial silhouette within normal limits. Mediastinal contours normal. Trachea midline.  No airspace disease or effusion.  IMPRESSION: No active cardiopulmonary disease.   Original Report Authenticated By: Dereck Ligas, M.D.     Microbiology: Recent Results (from the past 240 hour(s))  MRSA PCR SCREENING     Status: Normal   Collection Time   02/18/12 11:44 PM      Component Value Range Status Comment   MRSA by PCR NEGATIVE  NEGATIVE Final      Labs: Basic Metabolic Panel:  Lab [id number] 02/19/12 1359 02/19/12 0936 02/19/12 0625 02/19/12 0215  NA 137 131* 136 138 139  K 3.0* 3.7 3.9 3.5 4.5  CL 106 100 104 107 107  CO2 21 17* 16* 17* 13*  GLUCOSE 159* 231* 218* 132* 155*  BUN 7 8 10 10 13   CREATININE 0.47* 0.48* 0.55 0.56 0.55  CALCIUM 8.5 8.4 8.8 8.8 8.8  MG -- -- -- -- --  PHOS -- -- -- -- --    CBC:  Lab 02/20/12 0431 02/18/12 1752  WBC 10.3 22.9*    NEUTROABS -- 18.4*  HGB 10.5* 11.2*  HCT 30.4* 43.5  MCV 81.5 85.8  PLT 281 458*   Cardiac Enzymes:  Lab 02/18/12 1752  CKTOTAL --  CKMB --  CKMBINDEX --  TROPONINI <0.30   CBG:  Lab 02/20/12 1137 02/20/12 0739 02/19/12 2108 02/19/12 1701 02/19/12 1403  GLUCAP 205* 122* 234* 399* 217*       Signed:  Mrytle Bento  [facility] 02/20/2012, 12:16 PM

## 2012-02-25 ENCOUNTER — Encounter (HOSPITAL_COMMUNITY): Payer: Self-pay | Admitting: Emergency Medicine

## 2012-02-25 ENCOUNTER — Emergency Department (HOSPITAL_COMMUNITY)
Admission: EM | Admit: 2012-02-25 | Discharge: 2012-02-25 | Disposition: A | Discharge status: Home / Self Care | Payer: Self-pay | Attending: Emergency Medicine | Admitting: Emergency Medicine

## 2012-02-25 DIAGNOSIS — N764 Abscess of vulva: Secondary | ICD-10-CM | POA: Insufficient documentation

## 2012-02-25 DIAGNOSIS — L0291 Cutaneous abscess, unspecified: Secondary | ICD-10-CM

## 2012-02-25 DIAGNOSIS — Z794 Long term (current) use of insulin: Secondary | ICD-10-CM | POA: Insufficient documentation

## 2012-02-25 DIAGNOSIS — Z8619 Personal history of other infectious and parasitic diseases: Secondary | ICD-10-CM | POA: Insufficient documentation

## 2012-02-25 DIAGNOSIS — E109 Type 1 diabetes mellitus without complications: Secondary | ICD-10-CM | POA: Insufficient documentation

## 2012-02-25 MED ORDER — CLINDAMYCIN HCL 150 MG PO CAPS
300.0000 mg | ORAL_CAPSULE | Freq: Once | ORAL | Status: AC
  Administered 2012-02-25: 300 mg via ORAL
  Filled 2012-02-25: qty 2

## 2012-02-25 MED ORDER — CLINDAMYCIN HCL 150 MG PO CAPS: 300.0000 mg | ORAL_CAPSULE | Freq: Three times a day (TID) | ORAL | Status: DC

## 2012-02-25 MED ORDER — TRAMADOL HCL 50 MG PO TABS: 50.0000 mg | ORAL_TABLET | Freq: Four times a day (QID) | ORAL | Status: DC | PRN

## 2012-02-25 NOTE — ED Provider Notes (Signed)
History    This chart was scribed for Carmin Muskrat, MD, MD by Rhae Lerner. The patient was seen in room [facility] and the patient's care was started at 12:53PM.   CSN: [id number]  Arrival date & time 02/25/12  1150   First MD Initiated Contact with Patient 02/25/12 1244      Chief Complaint  Patient presents with  . Abscess    (Consider location/radiation/quality/duration/timing/severity/associated sxs/prior treatment) Patient is a 21 y.o. female presenting with abscess. The history is provided by the patient. No language interpreter was used.  Abscess  Pertinent negatives include no vomiting.   Ashley Freeman is a 21 y.o. female who presents to the Emergency Department complaining of moderate boil in groin onset 2 days ago. Pt denies pus and drainage. Denies fever, vomiting, confusion and disorientation. She reports that she has had a cold for the past 3 days. Pt has hx of DM.   Past Medical History  Diagnosis Date  . Gonorrhea 08/2011    Not treated  . Diabetes mellitus 2001    Diagnosed at age 48 ; Type I    Past Surgical History  Procedure Date  . No past surgeries     Family History  Problem Relation Age of Onset  . Anesthesia problems Neg Hx   . Asthma Mother   . Gout Father   . Diabetes Paternal Grandmother     History  Substance Use Topics  . Smoking status: Never Smoker   . Smokeless tobacco: Never Used  . Alcohol Use: No    OB History    Grav Para Term Preterm Abortions TAB SAB Ect Mult Living   1 0   1  1   0      Review of Systems  Constitutional:       Per HPI, otherwise negative  HENT:       Per HPI, otherwise negative  Eyes: Negative.   Respiratory:       Per HPI, otherwise negative  Cardiovascular:       Per HPI, otherwise negative  Gastrointestinal: Negative for vomiting.  Genitourinary: Negative.   Musculoskeletal:       Per HPI, otherwise negative  Skin: Negative.   Neurological: Negative for syncope.    Allergies    Penicillins  Home Medications   Current Outpatient Rx  Name Route Sig Dispense Refill  . INSULIN ASPART 100 UNIT/ML [place] SOLN Subcutaneous Inject 3-10 Units into the skin 3 (three) times daily with meals. Sliding scale    . INSULIN ISOPHANE HUMAN 100 UNIT/ML [place] SUSP Subcutaneous Inject 20 Units into the skin 2 (two) times daily. Inject 20 units before breakfast and dinner      BP 112/69  Pulse 108  Temp 98.1 F (36.7 C) (Oral)  Resp 20  SpO2 96%  LMP 01/19/2012  Physical Exam  Nursing note and vitals reviewed. Constitutional: She is oriented to person, place, and time. She appears well-developed and well-nourished. No distress.  HENT:  Head: Normocephalic and atraumatic.  Eyes: EOM are normal.  Neck: Neck supple. No tracheal deviation present.  Cardiovascular: Normal rate, regular rhythm and normal heart sounds.   Pulmonary/Chest: Effort normal and breath sounds normal. No respiratory distress. She has no wheezes. She has no rales.  Genitourinary:       There is a raised 2x10 cm tender area left external edge of labia majora. Area of fluctuance in middle  No drainage Labia majora otherwise normal with no appreciable medial findings /  edema / discharge.    Musculoskeletal: Normal range of motion.  Neurological: She is alert and oriented to person, place, and time.  Skin: Skin is warm and dry.  Psychiatric: She has a normal mood and affect. Her behavior is normal.    ED Course  INCISION AND DRAINAGE Date/Time: 02/25/2012 1:35 PM Performed by: Carmin Muskrat Authorized by: Carmin Muskrat Consent: Verbal consent obtained. Written consent not obtained. The procedure was performed in an emergent situation. Risks and benefits: risks, benefits and alternatives were discussed Consent given by: patient Patient identity confirmed: verbally with patient Time out: Immediately prior to procedure a "time out" was called to verify the correct patient, procedure, equipment, support  staff and site/side marked as required. Type: abscess Body area: anogenital Location details: vulva Anesthesia: local infiltration Local anesthetic: lidocaine 1% with epinephrine Anesthetic total: 3 ml Patient sedated: no Scalpel size: 11 Incision type: single with marsupialization Complexity: complex Drainage: purulent Drainage amount: copious Wound treatment: wound left open Packing material: none Patient tolerance: Patient tolerated the procedure well with no immediate complications.   (including critical care time)   COORDINATION OF CARE: 12:56 PM Discussed ED treatment with pt     Labs Reviewed - No data to display No results found.   No diagnosis found.    MDM  I personally performed the services described in this documentation, which was scribed in my presence. The recorded information has been reviewed and considered.  This young female presents after a recent hospitalization for DKA now with concerns over a skin lesion near her left labia majora on exam she is in no distress.  The patient is mildly tachycardic, but afebrile.  There is an abscess on the external edge of her left labia majora.  This was incised and drained with marsupialization.  Patient tolerated the procedure well.  She was discharged after a long discussion on return precautions, wound care instructions, the need for wound check in 2 days.  Carmin Muskrat, MD 02/25/12 [phone]

## 2012-02-25 NOTE — ED Notes (Signed)
Pt stated that she has a "boil" on the left side of her groin area.

## 2012-02-25 NOTE — ED Notes (Addendum)
Swollen,red, tender lesion left groin. "I have an abscess".

## 2012-02-27 LAB — CULTURE, ROUTINE-ABSCESS: Gram Stain: NONE SEEN

## 2012-02-28 NOTE — ED Notes (Signed)
Patient treated with I/D and Clindamycin-Chart appended per protocol MD.

## 2012-03-10 ENCOUNTER — Encounter: Payer: Self-pay | Admitting: Internal Medicine

## 2012-03-18 ENCOUNTER — Inpatient Hospital Stay (HOSPITAL_COMMUNITY): Payer: Medicaid Other

## 2012-03-18 ENCOUNTER — Encounter: Payer: Self-pay | Admitting: Internal Medicine

## 2012-03-18 ENCOUNTER — Inpatient Hospital Stay (HOSPITAL_COMMUNITY)
Admission: EM | Admit: 2012-03-18 | Discharge: 2012-03-21 | DRG: 639 | Disposition: A | Discharge status: Home / Self Care | Payer: Medicaid Other | Attending: Internal Medicine | Admitting: Internal Medicine

## 2012-03-18 ENCOUNTER — Encounter (HOSPITAL_COMMUNITY): Payer: Self-pay

## 2012-03-18 DIAGNOSIS — E1110 Type 2 diabetes mellitus with ketoacidosis without coma: Secondary | ICD-10-CM | POA: Diagnosis present

## 2012-03-18 DIAGNOSIS — R739 Hyperglycemia, unspecified: Secondary | ICD-10-CM

## 2012-03-18 DIAGNOSIS — D72829 Elevated white blood cell count, unspecified: Secondary | ICD-10-CM | POA: Diagnosis present

## 2012-03-18 DIAGNOSIS — Z91199 Patient's noncompliance with other medical treatment and regimen due to unspecified reason: Secondary | ICD-10-CM

## 2012-03-18 DIAGNOSIS — E872 Acidosis: Secondary | ICD-10-CM

## 2012-03-18 DIAGNOSIS — Z880 Allergy status to penicillin: Secondary | ICD-10-CM

## 2012-03-18 DIAGNOSIS — E109 Type 1 diabetes mellitus without complications: Secondary | ICD-10-CM

## 2012-03-18 DIAGNOSIS — Z9119 Patient's noncompliance with other medical treatment and regimen: Secondary | ICD-10-CM

## 2012-03-18 DIAGNOSIS — D66 Hereditary factor VIII deficiency: Secondary | ICD-10-CM

## 2012-03-18 DIAGNOSIS — E1010 Type 1 diabetes mellitus with ketoacidosis without coma: Principal | ICD-10-CM | POA: Diagnosis present

## 2012-03-18 DIAGNOSIS — E1065 Type 1 diabetes mellitus with hyperglycemia: Secondary | ICD-10-CM | POA: Diagnosis present

## 2012-03-18 DIAGNOSIS — D649 Anemia, unspecified: Secondary | ICD-10-CM | POA: Diagnosis present

## 2012-03-18 DIAGNOSIS — E875 Hyperkalemia: Secondary | ICD-10-CM | POA: Diagnosis present

## 2012-03-18 DIAGNOSIS — Z794 Long term (current) use of insulin: Secondary | ICD-10-CM

## 2012-03-18 DIAGNOSIS — Z79899 Other long term (current) drug therapy: Secondary | ICD-10-CM

## 2012-03-18 LAB — CBC
HCT: 34.7 % — ABNORMAL LOW (ref 36.0–46.0)
Hemoglobin: 11.6 g/dL — ABNORMAL LOW (ref 12.0–15.0)
RBC: 4.06 MIL/uL (ref 3.87–5.11)
WBC: 13.7 10*3/uL — ABNORMAL HIGH (ref 4.0–10.5)

## 2012-03-18 LAB — BASIC METABOLIC PANEL
BUN: 21 mg/dL (ref 6–23)
BUN: 10 mg/dL (ref 6–23)
CO2: 8 mEq/L — CL (ref 19–32)
CO2: 12 mEq/L — ABNORMAL LOW (ref 19–32)
CO2: 16 mEq/L — ABNORMAL LOW (ref 19–32)
Calcium: 10.8 mg/dL — ABNORMAL HIGH (ref 8.4–10.5)
Calcium: 8.5 mg/dL (ref 8.4–10.5)
Chloride: 88 mEq/L — ABNORMAL LOW (ref 96–112)
Chloride: 107 mEq/L (ref 96–112)
Creatinine, Ser: 0.78 mg/dL (ref 0.50–1.10)
Creatinine, Ser: 0.52 mg/dL (ref 0.50–1.10)
GFR calc Af Amer: >90 mL/min (ref >90)
GFR calc Af Amer: >90 mL/min (ref >90)
GFR calc Af Amer: >90 mL/min (ref >90)
GFR calc non Af Amer: >90 mL/min (ref >90)
GFR calc non Af Amer: >90 mL/min (ref >90)
Glucose, Bld: 869 mg/dL (ref 70–99)
Glucose, Bld: 180 mg/dL — ABNORMAL HIGH (ref 70–99)
Potassium: 4 mEq/L (ref 3.5–5.1)
Potassium: 4 mEq/L (ref 3.5–5.1)
Potassium: 3.5 mEq/L (ref 3.5–5.1)
Sodium: 136 mEq/L (ref 135–145)
Sodium: 133 mEq/L — ABNORMAL LOW (ref 135–145)

## 2012-03-18 LAB — COMPREHENSIVE METABOLIC PANEL
Albumin: 3.5 g/dL (ref 3.5–5.2)
Alkaline Phosphatase: 90 U/L (ref 39–117)
BUN: 14 mg/dL (ref 6–23)
CO2: 11 mEq/L — ABNORMAL LOW (ref 19–32)
Chloride: 100 mEq/L (ref 96–112)
GFR calc Af Amer: >90 mL/min (ref >90)
GFR calc non Af Amer: >90 mL/min (ref >90)
Glucose, Bld: 246 mg/dL — ABNORMAL HIGH (ref 70–99)
Potassium: 4.4 mEq/L (ref 3.5–5.1)
Total Bilirubin: 0.4 mg/dL (ref 0.3–1.2)

## 2012-03-18 LAB — GLUCOSE, CAPILLARY
Glucose-Capillary: 519 mg/dL — ABNORMAL HIGH (ref 70–99)
Glucose-Capillary: 256 mg/dL — ABNORMAL HIGH (ref 70–99)
Glucose-Capillary: 183 mg/dL — ABNORMAL HIGH (ref 70–99)
Glucose-Capillary: 190 mg/dL — ABNORMAL HIGH (ref 70–99)
Glucose-Capillary: 177 mg/dL — ABNORMAL HIGH (ref 70–99)
Glucose-Capillary: 189 mg/dL — ABNORMAL HIGH (ref 70–99)

## 2012-03-18 LAB — CBC WITH DIFFERENTIAL/PLATELET
Basophils Relative: 1 % (ref 0–1)
Eosinophils Absolute: 0 10*3/uL (ref 0.0–0.7)
Lymphocytes Relative: 15 % (ref 12–46)
MCH: 28.7 pg (ref 26.0–34.0)
MCHC: 32.3 g/dL (ref 30.0–36.0)
Monocytes Relative: 3 % (ref 3–12)
Neutro Abs: 8 10*3/uL — ABNORMAL HIGH (ref 1.7–7.7)
Neutrophils Relative %: 82 % — ABNORMAL HIGH (ref 43–77)
Platelets: 350 10*3/uL (ref 150–400)
RDW: 14.2 % (ref 11.5–15.5)

## 2012-03-18 LAB — URINALYSIS, ROUTINE W REFLEX MICROSCOPIC
Bilirubin Urine: NEGATIVE
Glucose, UA: >1000 mg/dL — AB
Ketones, ur: >80 mg/dL — AB
Nitrite: NEGATIVE
Specific Gravity, Urine: 1.031 — ABNORMAL HIGH (ref 1.005–1.030)
Urobilinogen, UA: 0.2 mg/dL (ref 0.0–1.0)
pH: 5 (ref 5.0–8.0)

## 2012-03-18 LAB — KETONES, QUALITATIVE

## 2012-03-18 LAB — BLOOD GAS, VENOUS
Acid-base deficit: 21.6 mmol/L — ABNORMAL HIGH (ref 0.0–2.0)
Bicarbonate: 7.7 mEq/L — ABNORMAL LOW (ref 20.0–24.0)
O2 Saturation: 36.5 %
Patient temperature: 98.6
TCO2: 7.5 mmol/L (ref 0–100)
pO2, Ven: 29.7 mmHg — CL (ref 30.0–45.0)

## 2012-03-18 LAB — URINE MICROSCOPIC-ADD ON

## 2012-03-18 LAB — MRSA PCR SCREENING: MRSA by PCR: NEGATIVE

## 2012-03-18 LAB — PREGNANCY, URINE: Preg Test, Ur: NEGATIVE

## 2012-03-18 MED ORDER — POTASSIUM CHLORIDE CRYS ER 20 MEQ PO TBCR: 40.0000 meq | EXTENDED_RELEASE_TABLET | Freq: Once | ORAL | Status: DC

## 2012-03-18 MED ORDER — DEXTROSE-NACL 5-0.45 % IV SOLN
INTRAVENOUS | Status: DC
  Administered 2012-03-18: 1000 mL via INTRAVENOUS

## 2012-03-18 MED ORDER — DEXTROSE 50 % IV SOLN: 25.0000 mL | INTRAVENOUS | Status: DC | PRN

## 2012-03-18 MED ORDER — SODIUM CHLORIDE 0.9 % IV SOLN
INTRAVENOUS | Status: DC
  Administered 2012-03-19: via INTRAVENOUS
  Administered 2012-03-19: 1000 mL via INTRAVENOUS
  Administered 2012-03-19: 02:00:00 via INTRAVENOUS
  Administered 2012-03-20: 1000 mL via INTRAVENOUS

## 2012-03-18 MED ORDER — SODIUM CHLORIDE 0.9 % IV SOLN: 1000.0000 mL | INTRAVENOUS | Status: DC

## 2012-03-18 MED ORDER — SODIUM CHLORIDE 0.9 % IV SOLN
INTRAVENOUS | Status: AC
  Administered 2012-03-18 (×2): 1000 mL via INTRAVENOUS

## 2012-03-18 MED ORDER — POTASSIUM CHLORIDE CRYS ER 20 MEQ PO TBCR
40.0000 meq | EXTENDED_RELEASE_TABLET | Freq: Once | ORAL | Status: AC
  Administered 2012-03-18: 40 meq via ORAL
  Filled 2012-03-18: qty 2

## 2012-03-18 MED ORDER — ENOXAPARIN SODIUM 40 MG/0.4ML ~~LOC~~ SOLN
40.0000 mg | SUBCUTANEOUS | Status: DC
  Administered 2012-03-18 – 2012-03-20 (×3): 40 mg via SUBCUTANEOUS
  Filled 2012-03-18 (×4): qty 0.4

## 2012-03-18 MED ORDER — SODIUM CHLORIDE 0.9 % IV SOLN
INTRAVENOUS | Status: DC
  Administered 2012-03-18: 1.2 [IU]/h via INTRAVENOUS
  Administered 2012-03-18: 2.6 [IU]/h via INTRAVENOUS
  Administered 2012-03-18: 1.2 [IU]/h via INTRAVENOUS
  Administered 2012-03-18: 1 [IU]/h via INTRAVENOUS
  Administered 2012-03-18: 5.4 [IU]/h via INTRAVENOUS
  Administered 2012-03-18: 1.6 [IU]/h via INTRAVENOUS
  Filled 2012-03-18 (×2): qty 1

## 2012-03-18 MED ORDER — SODIUM CHLORIDE 0.9 % IV SOLN: 1000.0000 mL | Freq: Once | INTRAVENOUS | Status: DC

## 2012-03-18 MED ORDER — SODIUM CHLORIDE 0.9 % IV SOLN
1000.0000 mL | Freq: Once | INTRAVENOUS | Status: AC
  Administered 2012-03-18: 1000 mL via INTRAVENOUS

## 2012-03-18 MED ORDER — INSULIN GLARGINE 100 UNIT/ML ~~LOC~~ SOLN
10.0000 [IU] | Freq: Once | SUBCUTANEOUS | Status: AC
  Administered 2012-03-18: 10 [IU] via SUBCUTANEOUS

## 2012-03-18 MED ORDER — INSULIN ASPART 100 UNIT/ML ~~LOC~~ SOLN
0.0000 [IU] | Freq: Three times a day (TID) | SUBCUTANEOUS | Status: DC
  Administered 2012-03-19: 5 [IU] via SUBCUTANEOUS
  Administered 2012-03-19: 9 [IU] via SUBCUTANEOUS
  Administered 2012-03-19: 3 [IU] via SUBCUTANEOUS
  Administered 2012-03-20: 1 [IU] via SUBCUTANEOUS
  Administered 2012-03-20: 9 [IU] via SUBCUTANEOUS
  Administered 2012-03-21: 7 [IU] via SUBCUTANEOUS

## 2012-03-18 MED ORDER — SODIUM CHLORIDE 0.9 % IV SOLN
INTRAVENOUS | Status: AC
  Administered 2012-03-18: 2.5 [IU]/h via INTRAVENOUS
  Administered 2012-03-18: 0.6 [IU]/h via INTRAVENOUS
  Administered 2012-03-18: 1.6 [IU]/h via INTRAVENOUS
  Administered 2012-03-19: 1 [IU]/h via INTRAVENOUS
  Filled 2012-03-18: qty 1

## 2012-03-18 MED ORDER — SODIUM CHLORIDE 0.9 % IV SOLN
1000.0000 mL | INTRAVENOUS | Status: DC
  Administered 2012-03-18: 1000 mL via INTRAVENOUS

## 2012-03-18 NOTE — ED Notes (Signed)
RT called to run venous blood gas, placed on ice in mini-lab, will monitor.

## 2012-03-18 NOTE — Progress Notes (Addendum)
1410: pt arrived to unit via EMS. No IV, O2 (insulin drip cut off d/t EMS not able to transport pts with that sort of drip)  1428: Paged Annamarie Dawley, DO regarding pt arrival to unit.  Orders were received.  Loleta Dicker, RN

## 2012-03-18 NOTE — Progress Notes (Signed)
Spoke with main pharmacy regarding y-ing in D5-1/2NS with insulin drip. Ok to do so per pharmacy.  Loleta Dicker, RN

## 2012-03-18 NOTE — H&P (Signed)
Date: 03/18/2012               Patient Name:  Ashley Freeman MRN: [id number]  DOB: 12-May-1990 Age / Sex: 21 y.o., female   PCP: Cresenciano Genre              Medical Service: Internal Medicine Teaching Service              Attending Physician: Dr. Murlean Caller    First Contact: Dr. Sissy Hoff Pager: [phone]  Second Contact: Dr. Blaine Hamper Pager: [phone]            After Hours (After 5p/  First Contact Pager: [phone]  weekends / holidays): Second Contact Pager: [phone]    Chief Complaint: Hyperglycemia  History of Present Illness: Patient is a 21 y.o. female with a PMHx of very poorly controlled Type 1 diabetes mellitus (HgbA1c > 20 in 01/2012) with 3 prior DKA admissions in 2013 (last 01/2012) and significant insulin noncompliance who presented to [facility] ED, and subsequently transferred to [facility] for evaluation and treatment of hyperglycemia. The patient indicates that she has been out of her insulin for the last 2 days secondary to being locked out of her house, where the insulin is kept. She otherwise dates that she is feeling well without any nausea, vomiting, confusion, lethargy. She confirms polyuria. The patient also denies recent cough, congestion, dysuria, hematuria, vaginal lesions or discharge. Denies sick contacts. The patient states that she does not use any alcohol or illicit drugs.  During the [facility] long ER course, the patient was noted to have a presenting of greater than 800, as well she had a metabolic acidosis with an anion gap of 34. The patient was initiated on command her protocol and transferred to [facility] for continued care.  Of note, the patient has lapsed her Medicaid eligibility, and her boyfriend is currently helping with medication assistance.    Review of Systems: Constitutional:  denies fever, chills, diaphoresis, appetite change and fatigue.  HEENT: denies photophobia, eye pain, redness, hearing loss, ear pain, congestion, sore throat, rhinorrhea, sneezing, neck  pain, neck stiffness and tinnitus.  Respiratory: denies SOB, DOE, cough, chest tightness, and wheezing.  Cardiovascular: denies chest pain, palpitations and leg swelling.  Gastrointestinal: denies nausea, vomiting, abdominal pain, diarrhea, constipation, blood in stool.  Genitourinary: denies dysuria, urgency, frequency, hematuria, flank pain and difficulty urinating.  Musculoskeletal: denies  myalgias, back pain, joint swelling, arthralgias and gait problem.   Skin: denies pallor, rash and wound.  Neurological: denies dizziness, seizures, syncope, weakness, light-headedness, numbness and headaches.   Hematological: denies adenopathy, easy bruising, personal or family bleeding history.  Psychiatric/ Behavioral: denies suicidal ideation, mood changes, confusion, nervousness, sleep disturbance and agitation.    Current Outpatient Medications: Current Facility-Administered Medications Medication Dose  . [COMPLETED] 0.9 %  sodium chloride infusion  1,000 mL  . [COMPLETED] 0.9 %  sodium chloride infusion  1,000 mL  . 0.9 %  sodium chloride infusion  1,000 mL  . insulin regular (NOVOLIN R,HUMULIN R) 1 Units/mL in sodium chloride 0.9 % 100 mL infusion    . [DISCONTINUED] 0.9 %  sodium chloride infusion  1,000 mL  . [DISCONTINUED] 0.9 %  sodium chloride infusion  1,000 mL    Current Outpatient Prescriptions Medication Sig  . insulin aspart (NOVOLOG) 100 UNIT/ML injection Inject 3-10 Units into the skin 3 (three) times daily with meals. Sliding scale  . insulin NPH (HUMULIN N,NOVOLIN N) 100 UNIT/ML injection Inject 20 Units into the skin  2 (two) times daily. Inject 20 units before breakfast and dinner  . traMADol (ULTRAM) 50 MG tablet Take 1 tablet (50 mg total) by mouth every 6 (six) hours as needed for pain.    Allergies: Allergies  Allergen Reactions  . Penicillins Hives     Past Medical History: Past Medical History  Diagnosis Date  . Gonorrhea 08/2011    Treated in 09/2011  .  Diabetes mellitus 2001    Diagnosed at age 79 ; Type I    Past Surgical History: Past Surgical History  Procedure Date  . No past surgeries     Family History: Family History  Problem Relation Age of Onset  . Anesthesia problems Neg Hx   . Asthma Mother   . Gout Father   . Diabetes Paternal Grandmother     Social History: History   Social History  . Marital Status: Single    Spouse Name: N/A    Number of Children: 0  . Years of Education: 11th grade   Occupational History  . unemployed     has never worked   Social History Main Topics  . Smoking status: Never Smoker   . Smokeless tobacco: Never Used  . Alcohol Use: No  . Drug Use: No  . Sexually Active: Yes    Birth Control/ Protection: None     Comment: As of 11/06/11 1 partner (female) not using protection   Other Topics Concern  . Not on file   Social History Narrative   Patient lives in [place] with boyfriend. Mother lives in [place] and Father lives in [place]. Patient works as a Secretary/administrator. Completed 11 grade. Patient 3 brothers      Vital Signs: Blood pressure 103/71, pulse 94, temperature 98 F (36.7 C), temperature source Oral, resp. rate 18, height 5\' 3"  (1.6 m), weight 124 lb 1.9 oz (56.3 kg), last menstrual period 01/19/2012, SpO2 100.00%, not currently breastfeeding.   Physical Exam: General: Vital signs reviewed and noted. Well-developed, well-nourished, in no acute distress; alert, appropriate and cooperative throughout examination.  Head: Normocephalic, atraumatic.  Eyes: PERRL, EOMI, No signs of anemia or jaundince.  Nose: Mucous membranes moist, not inflammed, nonerythematous.  Throat: Oropharynx nonerythematous with dry mucous membranes of the mouth, no exudate appreciated.   Neck: No deformities, masses, or tenderness noted. Supple, no JVD.  Lungs:  Normal respiratory effort. Clear to auscultation BL without crackles or wheezes.  Heart: RRR. S1 and S2 normal without gallop,  murmur, or rubs.   Abdomen:  BS normoactive. Soft, Nondistended, non-tender.  No masses or organomegaly.  Extremities: No pretibial edema.  Neurologic: A&O X3, CN II - XII are grossly intact. Motor strength is 5/5 in the all 4 extremities, Sensations intact to light touch, Cerebellar signs negative.  Skin: No visible rashes, scars.    Lab results:  CURRENT LABS: CBC    Component Value Date/Time   WBC 13.7* 03/18/2012 1500   HGB 11.6* 03/18/2012 1500   HCT 34.7* 03/18/2012 1500   PLT 341 03/18/2012 1500   MCV 85.5 03/18/2012 1500   NEUTROABS 8.0* 03/18/2012 0855   LYMPHSABS 1.5 03/18/2012 0855   MONOABS 0.3 03/18/2012 0855   EOSABS 0.0 03/18/2012 0855   BASOSABS 0.1 [id number]     Metabolic Panel    Component Value Date/Time   NA 136 03/18/2012 1629   K 4.0 03/18/2012 1629   CL 106 03/18/2012 1629   CO2 12* 03/18/2012 1629   BUN 12 03/18/2012 1629   CREATININE  0.61 03/18/2012 1629   CREATININE 0.59 11/06/2011 1556   GLUCOSE 186* 03/18/2012 1629   CALCIUM 8.5 03/18/2012 1629   AST 30 03/18/2012 1500   ALT 21 03/18/2012 1500   ALKPHOS 90 03/18/2012 1500   BILITOT 0.4 03/18/2012 1500   PROT 7.2 03/18/2012 1500   ALBUMIN 3.5 03/18/2012 1500     Urinalysis  Basename 03/18/12 0714  COLORURINE YELLOW  LABSPEC 1.031*  PHURINE 5.0  GLUCOSEU >1000*  HGBUR NEGATIVE  BILIRUBINUR NEGATIVE  KETONESUR >80*  PROTEINUR NEGATIVE  UROBILINOGEN 0.2  NITRITE NEGATIVE  LEUKOCYTESUR NEGATIVE     Drugs of Abuse     Component Value Date/Time   LABOPIA POSITIVE* 02/18/2012 1732   LABOPIA NEGATIVE 12/18/2009 1453   COCAINSCRNUR NONE DETECTED 02/18/2012 1732   COCAINSCRNUR NEGATIVE 12/18/2009 1453   LABBENZ NONE DETECTED 02/18/2012 1732   LABBENZ NEGATIVE 12/18/2009 1453   AMPHETMU NONE DETECTED 02/18/2012 1732   AMPHETMU NEGATIVE 12/18/2009 1453   THCU NONE DETECTED 02/18/2012 1732   LABBARB NONE DETECTED 02/18/2012 1732     VBG    Component Value Date/Time   PHART  7.071* 02/18/2012 1732   PCO2ART 11.1* 02/18/2012 1732   PO2ART 125.0* 02/18/2012 1732   HCO3 7.7* 03/18/2012 0659   TCO2 7.5 03/18/2012 0659   ACIDBASEDEF 21.6* 03/18/2012 0659   O2SAT 36.5 03/18/2012 0659      HISTORICAL LABS: Lab Results  Component Value Date   HGBA1C >20.0* 02/19/2012     Lab Results  Component Value Date   TSH 2.931 12/23/2009   T4TOTAL 8.3 12/23/2009    Imaging results:   Dg Chest 2 View (03/18/2012) - No acute cardiopulmonary disease.   Original Report Authenticated By: Marin Olp, M.D.      Other results:  EKG (03/18/2012) - pending.   Assessment & Plan:  Pt is a 21 y.o. yo female with a PMHx of very poorly controlled DM1 (last A1c >20 in 01/2012), history of noncompliance with insulin who was admitted on 03/18/2012 with symptoms of nausea, polydipsia, which was determined to be secondary to DKA (with admission AG of 34). Interventions at this time will be focused on treatment of her DKA and better defining reason/ ways to help address frequent medication noncompliance.    1) Diabetic Ketoacidosis - Secondary to medication noncompliance (out of insulin x 2 days and did not call PCP). On admission, there is a severe metabolic acidosis with AG of 34 with delta-delta of 1.3, indicating pure AG metabolic acidosis alone. As well, serum acetone and urine ketones are (+). This is the patient's 4th admission in 2013 for DKA in setting of medication noncompliance. Patient otherwise denies drug abuse such as cocaine. Denies excessive alcohol abuse.   Plan: - Admit to SDU - Initiate glucomander protocol - will transition to subQ insulin once gap closed and CBG < 250 consistently. - Aggressive IVF (has already received 2L NS in [facility] ED), will provide at least additional 2 L. - Will request for our [facility] DM Educator Butch Penny Plyler) to see the patient again to reinforce compliance. - Check serum alcohol level and UDS  - Check TSH. - Will eventually need to have  lipid panel checked (not during admission in setting of false results to be expected during DKA). - Will need ongoing and continued effort towards diabetic education.   2) Metabolic derangements - VBG was not performed today. No fevers, change in mental status to suggest methanol or ethylene glycol toxicity. No recent new medications. No renal  failure and mental status changes to account for uremia. - Will work towards treating #1 and monitor.  - Check UDS and serum alcohol levels.   3) Leukocytosis - likely secondary to stress demargination in setting of acute DKA. UA negative for evidence of UTI.  - Will monitor, and continue to monitor for signs/ symptoms of infection.   4) Hyperkalemia - likely 2/2 #1 and insulin deficiency. As we will be providing insulin gtt, will need to very cautiously monitor K, and replete as appropriate. Expect resolution of this issue with administration of fluids and insulin. - Monitor K as per glucomander protocol.     DVT PPX - low molecular weight heparin  CODE STATUS - FULL  CONSULTS PLACED - None, will ask [place] DM Educator, Butch Penny Plyler to evaluate the patient in the hospital.  DISPO - Disposition is deferred at this time, awaiting improvement of blood sugar control, resolution of DKA.   Anticipated discharge in approximately 3-4 day(s).   The patient does have a current PCP Aundra Dubin, Nino Glow, MD), therefore will be requiring OPC follow-up after discharge.   Patient's need for transportation help to clinic appts will need to be assessed.   SERVICE NEEDED AT [place]         Y = Yes, Blank = No PT:   OT:   RN:   Equipment:   Other: 1. Diabetes educator as an outpatient 2. Outpatient SW to help with Continuation of medicaid application     Signed: Annamarie Dawley, DO  PGY-3, Internal Medicine Resident 03/18/2012, 5:42 PM

## 2012-03-18 NOTE — ED Provider Notes (Addendum)
History     CSN: [id number]  Arrival date & time 03/18/12  [id number]   First MD Initiated Contact with Patient 03/18/12 [phone]      Chief Complaint  Patient presents with  . Hyperglycemia    (Consider location/radiation/quality/duration/timing/severity/associated sxs/prior treatment) HPI Comments: Patient with a history of DKA in the past presents today with fatigue and dizziness. She states she feels like she's going to DKA. She states that she's been out of her house for the last 2 days and has been unable to take her insulin for the last 2 days. She feels a little nauseated but denies any vomiting. She has had some urinary frequency. She's had increased thirst. She denies he fevers cough congestion or recent illnesses. She states she's been feeling worse over last 2 days.   Past Medical History  Diagnosis Date  . Gonorrhea 08/2011    Not treated  . Diabetes mellitus 2001    Diagnosed at age 53 ; Type I    Past Surgical History  Procedure Date  . No past surgeries     Family History  Problem Relation Age of Onset  . Anesthesia problems Neg Hx   . Asthma Mother   . Gout Father   . Diabetes Paternal Grandmother     History  Substance Use Topics  . Smoking status: Never Smoker   . Smokeless tobacco: Never Used  . Alcohol Use: No    OB History    Grav Para Term Preterm Abortions TAB SAB Ect Mult Living   1 0   1  1   0      Review of Systems  Constitutional: Positive for fatigue. Negative for fever, chills and diaphoresis.  HENT: Negative for congestion, rhinorrhea and sneezing.   Eyes: Negative.   Respiratory: Negative for cough, chest tightness and shortness of breath.   Cardiovascular: Negative for chest pain and leg swelling.  Gastrointestinal: Positive for nausea. Negative for vomiting, abdominal pain, diarrhea and blood in stool.  Genitourinary: Negative for frequency, hematuria, flank pain and difficulty urinating.  Musculoskeletal: Negative for back pain and  arthralgias.  Skin: Negative for rash.  Neurological: Positive for light-headedness. Negative for dizziness, speech difficulty, weakness, numbness and headaches.    Allergies  Penicillins  Home Medications   Current Outpatient Rx  Name  Route  Sig  Dispense  Refill  . INSULIN ASPART 100 UNIT/ML [place] SOLN   Subcutaneous   Inject 3-10 Units into the skin 3 (three) times daily with meals. Sliding scale         . INSULIN ISOPHANE HUMAN 100 UNIT/ML [place] SUSP   Subcutaneous   Inject 20 Units into the skin 2 (two) times daily. Inject 20 units before breakfast and dinner         . TRAMADOL HCL 50 MG PO TABS   Oral   Take 1 tablet (50 mg total) by mouth every 6 (six) hours as needed for pain.   15 tablet   0     BP 128/77  Pulse 116  Temp 98.7 F (37.1 C) (Oral)  Resp 21  SpO2 100%  LMP 01/19/2012  Physical Exam  Constitutional: She is oriented to person, place, and time. She appears well-developed and well-nourished.       Smells of ketones  HENT:  Head: Normocephalic and atraumatic.       Dry mucous membranes  Eyes: Pupils are equal, round, and reactive to light.  Neck: Normal range of motion. Neck supple.  Cardiovascular: Normal rate, regular rhythm and normal heart sounds.   Pulmonary/Chest: Effort normal and breath sounds normal. No respiratory distress. She has no wheezes. She has no rales. She exhibits no tenderness.  Abdominal: Soft. Bowel sounds are normal. There is no tenderness. There is no rebound and no guarding.  Musculoskeletal: Normal range of motion. She exhibits no edema.  Lymphadenopathy:    She has no cervical adenopathy.  Neurological: She is alert and oriented to person, place, and time.  Skin: Skin is warm and dry. No rash noted.  Psychiatric: She has a normal mood and affect.    ED Course  Procedures (including critical care time)  Results for orders placed during the hospital encounter of 03/18/12  GLUCOSE, CAPILLARY      Component Value  Range   Glucose-Capillary >600 (*) 70 - 99 mg/dL   Comment 1 Notify RN    BLOOD GAS, VENOUS      Component Value Range   pH, Ven 7.085 (*) 7.250 - 7.300   pCO2, Ven 26.8 (*) 45.0 - 50.0 mmHg   pO2, Ven 29.7 (*) 30.0 - 45.0 mmHg   Bicarbonate 7.7 (*) 20.0 - 24.0 mEq/L   TCO2 7.5  0 - 100 mmol/L   Acid-base deficit 21.6 (*) 0.0 - 2.0 mmol/L   O2 Saturation 36.5     Patient temperature 98.6     Collection site VENOUS     Drawn by COLLECTED BY NURSE     Sample type VENOUS    BASIC METABOLIC PANEL      Component Value Range   Sodium 130 (*) 135 - 145 mEq/L   Potassium 5.4 (*) 3.5 - 5.1 mEq/L   Chloride 88 (*) 96 - 112 mEq/L   CO2 8 (*) 19 - 32 mEq/L   Glucose, Bld 869 (*) 70 - 99 mg/dL   BUN 21  6 - 23 mg/dL   Creatinine, Ser 0.78  0.50 - 1.10 mg/dL   Calcium 10.8 (*) 8.4 - 10.5 mg/dL   GFR calc non Af Amer >90  >90 mL/min   GFR calc Af Amer >90  >90 mL/min  KETONES, QUALITATIVE      Component Value Range   Acetone, Bld MODERATE (*) NEGATIVE  URINALYSIS, ROUTINE W REFLEX MICROSCOPIC      Component Value Range   Color, Urine YELLOW  YELLOW   APPearance CLOUDY (*) CLEAR   Specific Gravity, Urine 1.031 (*) 1.005 - 1.030   pH 5.0  5.0 - 8.0   Glucose, UA >1000 (*) NEGATIVE mg/dL   Hgb urine dipstick NEGATIVE  NEGATIVE   Bilirubin Urine NEGATIVE  NEGATIVE   Ketones, ur >80 (*) NEGATIVE mg/dL   Protein, ur NEGATIVE  NEGATIVE mg/dL   Urobilinogen, UA 0.2  0.0 - 1.0 mg/dL   Nitrite NEGATIVE  NEGATIVE   Leukocytes, UA NEGATIVE  NEGATIVE  PREGNANCY, URINE      Component Value Range   Preg Test, Ur NEGATIVE  NEGATIVE  URINE MICROSCOPIC-ADD ON      Component Value Range   Squamous Epithelial / LPF MANY (*) RARE   WBC, UA 0-2  <3 WBC/hpf   RBC / HPF 0-2  <3 RBC/hpf   Bacteria, UA RARE  RARE  CBC WITH DIFFERENTIAL      Component Value Range   WBC 9.8  4.0 - 10.5 K/uL   RBC 4.43  3.87 - 5.11 MIL/uL   Hemoglobin 12.7  12.0 - 15.0 g/dL   HCT 39.3  36.0 -  46.0 %   MCV 88.7  78.0  - 100.0 fL   MCH 28.7  26.0 - 34.0 pg   MCHC 32.3  30.0 - 36.0 g/dL   RDW 14.2  11.5 - 15.5 %   Platelets 350  150 - 400 K/uL   Neutrophils Relative 82 (*) 43 - 77 %   Neutro Abs 8.0 (*) 1.7 - 7.7 K/uL   Lymphocytes Relative 15  12 - 46 %   Lymphs Abs 1.5  0.7 - 4.0 K/uL   Monocytes Relative 3  3 - 12 %   Monocytes Absolute 0.3  0.1 - 1.0 K/uL   Eosinophils Relative 0  0 - 5 %   Eosinophils Absolute 0.0  0.0 - 0.7 K/uL   Basophils Relative 1  0 - 1 %   Basophils Absolute 0.1  0.0 - 0.1 K/uL  GLUCOSE, CAPILLARY      Component Value Range   Glucose-Capillary 519 (*) 70 - 99 mg/dL   Comment 1 Documented in Chart     Comment 2 Notify RN     Dg Chest 2 View  02/18/2012  *RADIOLOGY REPORT*  Clinical Data: Hyperglycemia.  Weakness.  Vomiting.  CHEST - 2 VIEW  Comparison: 10/17/2011.  Findings:  Cardiopericardial silhouette within normal limits. Mediastinal contours normal. Trachea midline.  No airspace disease or effusion.  IMPRESSION: No active cardiopulmonary disease.   Original Report Authenticated By: Dereck Ligas, M.D.      No results found.   1. DKA (diabetic ketoacidoses)       MDM  Pt given IVFs, start on glucostabilizer.   BP stable.  Still tachycardic.  Pt is markedly acidotic, but is alert.  No mental status changes.  Continuing fluids and insulin.  Discussed with internal medicine teaching service who has accepted pt.  Will transfer to [facility] to step down unit.  CRITICAL CARE Performed by: Chastin Garlitz   Total critical care time: 60  Critical care time was exclusive of separately billable procedures and treating other patients.  Critical care was necessary to treat or prevent imminent or life-threatening deterioration.  Critical care was time spent personally by me on the following activities: development of treatment plan with patient and/or surrogate as well as nursing, discussions with consultants, evaluation of patient's response to treatment, examination of  patient, obtaining history from patient or surrogate, ordering and performing treatments and interventions, ordering and review of laboratory studies, ordering and review of radiographic studies, pulse oximetry and re-evaluation of patient's condition.         Malvin Johns, MD 03/18/12 [place], MD 03/18/12 1029

## 2012-03-18 NOTE — ED Notes (Signed)
Attempted to call report to [phone] at [facility], RN unable to take report, will call back in a few minutes.

## 2012-03-18 NOTE — ED Notes (Signed)
Per pt, she has been out of insulin for 2 days.  Feels as if blood sugar is elevated.  Pt states she is nauseated and has been urinating frequently.  Pt also has had excess thirst.-

## 2012-03-18 NOTE — ED Notes (Signed)
[facility] called, was told that transport would be 2-3 hours, will inform Charge, RN and monitor.

## 2012-03-18 NOTE — ED Notes (Signed)
[facility] called for transport due to [facility] delay per Oneida Arenas, will monitor.

## 2012-03-18 NOTE — ED Notes (Signed)
[facility] arrived to pick pt up, condition stable at time of transfer to [facility], personal belongings given to EMS.

## 2012-03-18 NOTE — ED Notes (Signed)
Peds at [facility] called questioning bed request, request was placed correctly by Santiago Glad, [place] for SDU. [facility] called and correction made for the 3rd time, 1st bed request placed incorrectly for 2W, will monitor.

## 2012-03-19 LAB — BASIC METABOLIC PANEL
BUN: 7 mg/dL (ref 6–23)
BUN: 6 mg/dL (ref 6–23)
BUN: 8 mg/dL (ref 6–23)
CO2: 13 mEq/L — ABNORMAL LOW (ref 19–32)
CO2: 19 mEq/L (ref 19–32)
CO2: 17 mEq/L — ABNORMAL LOW (ref 19–32)
CO2: 19 mEq/L (ref 19–32)
Calcium: 8.6 mg/dL (ref 8.4–10.5)
Calcium: 8.7 mg/dL (ref 8.4–10.5)
Chloride: 102 mEq/L (ref 96–112)
Chloride: 97 mEq/L (ref 96–112)
Chloride: 101 mEq/L (ref 96–112)
Chloride: 101 mEq/L (ref 96–112)
Chloride: 102 mEq/L (ref 96–112)
Chloride: 103 mEq/L (ref 96–112)
Creatinine, Ser: 0.52 mg/dL (ref 0.50–1.10)
GFR calc Af Amer: >90 mL/min (ref >90)
GFR calc Af Amer: >90 mL/min (ref >90)
GFR calc Af Amer: >90 mL/min (ref >90)
GFR calc Af Amer: >90 mL/min (ref >90)
GFR calc Af Amer: >90 mL/min (ref >90)
GFR calc Af Amer: >90 mL/min (ref >90)
GFR calc non Af Amer: >90 mL/min (ref >90)
GFR calc non Af Amer: >90 mL/min (ref >90)
GFR calc non Af Amer: >90 mL/min (ref >90)
Glucose, Bld: 321 mg/dL — ABNORMAL HIGH (ref 70–99)
Glucose, Bld: 237 mg/dL — ABNORMAL HIGH (ref 70–99)
Potassium: 5.9 mEq/L — ABNORMAL HIGH (ref 3.5–5.1)
Potassium: 3.9 mEq/L (ref 3.5–5.1)
Potassium: 3.7 mEq/L (ref 3.5–5.1)
Potassium: 3.7 mEq/L (ref 3.5–5.1)
Potassium: 3.3 mEq/L — ABNORMAL LOW (ref 3.5–5.1)
Sodium: 128 mEq/L — ABNORMAL LOW (ref 135–145)
Sodium: 131 mEq/L — ABNORMAL LOW (ref 135–145)
Sodium: 135 mEq/L (ref 135–145)

## 2012-03-19 LAB — GLUCOSE, CAPILLARY
Glucose-Capillary: 184 mg/dL — ABNORMAL HIGH (ref 70–99)
Glucose-Capillary: 249 mg/dL — ABNORMAL HIGH (ref 70–99)
Glucose-Capillary: 381 mg/dL — ABNORMAL HIGH (ref 70–99)
Glucose-Capillary: 253 mg/dL — ABNORMAL HIGH (ref 70–99)
Glucose-Capillary: 141 mg/dL — ABNORMAL HIGH (ref 70–99)

## 2012-03-19 LAB — TSH: TSH: 0.384 u[IU]/mL (ref 0.350–4.500)

## 2012-03-19 LAB — PHOSPHORUS: Phosphorus: 2.1 mg/dL — ABNORMAL LOW (ref 2.3–4.6)

## 2012-03-19 MED ORDER — INSULIN NPH (HUMAN) (ISOPHANE) 100 UNIT/ML ~~LOC~~ SUSP
16.0000 [IU] | Freq: Two times a day (BID) | SUBCUTANEOUS | Status: DC
  Administered 2012-03-19: 16 [IU] via SUBCUTANEOUS
  Filled 2012-03-19: qty 10

## 2012-03-19 MED ORDER — INSULIN NPH (HUMAN) (ISOPHANE) 100 UNIT/ML ~~LOC~~ SUSP
20.0000 [IU] | Freq: Two times a day (BID) | SUBCUTANEOUS | Status: DC
  Administered 2012-03-19: 20 [IU] via SUBCUTANEOUS
  Filled 2012-03-19: qty 10

## 2012-03-19 MED ORDER — K PHOS MONO-SOD PHOS DI & MONO 155-852-130 MG PO TABS
500.0000 mg | ORAL_TABLET | Freq: Three times a day (TID) | ORAL | Status: AC
  Administered 2012-03-19 (×3): 500 mg via ORAL
  Filled 2012-03-19 (×3): qty 2

## 2012-03-19 NOTE — Progress Notes (Signed)
Subjective:  Ashley Freeman is feeling much better this morning. She denies nausea, vomiting, abd pain, dizziness. She is tolerating PO without difficulty, having a full lunch.  Her boyfriend states that she has not filled her insulin Rx recently and she makes it last for a 'long time'. Her pharmacy reports that the last refill for her NPH was in February of last year.   Objective: Vital signs in last 24 hours: Filed Vitals:   03/19/12 0440 03/19/12 0820 03/19/12 0909 03/19/12 1200  BP:  100/59  104/61  Pulse:  93 99 96  Temp: 97.8 F (36.6 C) 98.1 F (36.7 C)  97.8 F (36.6 C)  TempSrc: Oral Oral    Resp:   25 25  Height:      Weight:      SpO2:  100% 100% 97%   Weight change:   Intake/Output Summary (Last 24 hours) at 03/19/12 1321 Last data filed at 03/19/12 1300  Gross per 24 hour  Intake 5384.73 ml  Output      0 ml  Net 5384.73 ml    Physical Exam Blood pressure 104/61, pulse 96, temperature 97.8 F (36.6 C), temperature source Oral, resp. rate 25, height 5\' 3"  (1.6 m), weight 123 lb 7.3 oz (56 kg), last menstrual period 01/19/2012, SpO2 97.00%, not currently breastfeeding. General:  No acute distress, alert and oriented x 3, well-appearing  HEENT:  PERRL, EOMI, moist mucous membranes Cardiovascular:  Regular rate and rhythm, no murmurs, rubs or gallops Respiratory:  Clear to auscultation bilaterally, no wheezes, rales, or rhonchi Abdomen:  Soft, nondistended, nontender, bowel sounds present Extremities:  Warm and well-perfused, no edema.  Skin: Warm, dry, no rashes Neuro: flat affect  Lab Results: CBC    Component Value Date/Time   WBC 13.7* 03/18/2012 1500   RBC 4.06 03/18/2012 1500   HGB 11.6* 03/18/2012 1500   HCT 34.7* 03/18/2012 1500   PLT 341 03/18/2012 1500   MCV 85.5 03/18/2012 1500   MCH 28.6 03/18/2012 1500   MCHC 33.4 03/18/2012 1500   RDW 14.3 03/18/2012 1500   LYMPHSABS 1.5 03/18/2012 0855   MONOABS 0.3 03/18/2012 0855   EOSABS 0.0  03/18/2012 0855   BASOSABS 0.1 03/18/2012 0855    BMET    Component Value Date/Time   NA 129* 03/19/2012 0855   K 3.9 03/19/2012 0855   CL 97 03/19/2012 0855   CO2 13* 03/19/2012 0855   GLUCOSE 419* 03/19/2012 0855   BUN 7 03/19/2012 0855   CREATININE 0.53 03/19/2012 0855   CREATININE 0.59 11/06/2011 1556   CALCIUM 9.3 03/19/2012 0855   GFRNONAA >90 03/19/2012 0855   GFRAA >90 03/19/2012 0855     Micro Results: Recent Results (from the past 240 hour(s))  MRSA PCR SCREENING     Status: Normal   Collection Time   03/18/12  3:14 PM      Component Value Range Status Comment   MRSA by PCR NEGATIVE  NEGATIVE Final     Studies/Results: Dg Chest 2 View  03/18/2012  *RADIOLOGY REPORT*  Clinical Data: Shortness of breath.  CHEST - 2 VIEW  Comparison: 02/18/2012  Findings: Lungs are clear.  Cardiomediastinal silhouette and remainder of the exam is unchanged.  IMPRESSION: No acute cardiopulmonary disease.   Original Report Authenticated By: Marin Olp, M.D.     Medications: medications reviewed Scheduled Meds:   . enoxaparin  40 mg Subcutaneous Q24H  . insulin aspart  0-9 Units Subcutaneous TID WC  . [COMPLETED] insulin glargine  10 Units Subcutaneous Once  . insulin NPH  16 Units Subcutaneous BID WC  . phosphorus  500 mg Oral TID  . [COMPLETED] potassium chloride  40 mEq Oral Once  . potassium chloride  40 mEq Oral Once   Continuous Infusions:   . [EXPIRED] sodium chloride 1,000 mL (03/18/12 1549)  . sodium chloride 100 mL/hr at 03/19/12 0137  . [EXPIRED] insulin (NOVOLIN-R) infusion 5.7 Units/hr (03/19/12 0045)  . [DISCONTINUED] sodium chloride 1,000 mL (03/18/12 1003)  . [DISCONTINUED] dextrose 5 % and 0.45% NaCl 1,000 mL (03/18/12 1654)  . [DISCONTINUED] insulin (NOVOLIN-R) infusion 2 Units/hr (03/18/12 2008)   PRN Meds:.dextrose  Assessment/Plan: Patient Active Hospital Problem List: No active hospital problems.  Pt is a 21 y.o. yo female with a PMHx of very  poorly controlled DM1 (last A1c >20 in 01/2012), history of noncompliance with insulin who was admitted on 03/18/2012 with symptoms of nausea, polydipsia, which was determined to be secondary to DKA (with admission AG of 34). Interventions at this time will be focused on treatment of her DKA and better defining reason/ ways to help address frequent medication noncompliance.   1) Diabetic Ketoacidosis - resolving. Likely secondary to medication noncompliance (out of insulin x 2 days and did not call PCP). On admission, there was a severe metabolic acidosis with AG of 34 with delta-delta of 1.3, indicating pure AG metabolic acidosis. Serum acetone and urine ketones are (+). This is the patient's 4th admission in 2013 for DKA in setting of medication noncompliance. Patient otherwise denies drug abuse such as cocaine. Denies excessive alcohol abuse. No nausea or vomiting. 11/20: Patient's gap open this morning, AG of 13 then 19, however, the labs were drawn shortly before her home NPH was restarted. 1525 gap was 13. Blood glucose is trending down. Restarted on her home dose of 20 u NPH tonight with dinner. Patient appears euvolemic, eating full meals without difficulty.  -cont to follow BMP q4, restart insulin drip if AG >15 -long discussion by myself and multiple other staff members about insulin nonadherence, cont to encourage and education -will need close f/u in clinic -cont IVF at 100cc/h -follow K and phos and replete as needed  Leukocytosis - likely secondary to stress demargination in setting of acute DKA. UA negative for evidence of UTI.  - Will monitor, and continue to monitor for signs/ symptoms of infection.   Hyperkalemia - resolved. Likely 2/2 #1 and insulin deficiency. As we will be providing insulin gtt, will need to very cautiously monitor K, and replete as appropriate. Expect resolution of this issue with administration of fluids and insulin.  - Monitor K, replete as  needed  DVT -lovenox    LOS: 1 day   Santa Lighter 03/19/2012, 1:21 PM

## 2012-03-19 NOTE — Progress Notes (Signed)
Inpatient Diabetes Program Recommendations  AACE/ADA: New Consensus Statement on Inpatient Glycemic Control (2013)  Target Ranges:  Prepandial:   less than 140 mg/dL      Peak postprandial:   less than 180 mg/dL (1-2 hours)      Critically ill patients:  140 - 180 mg/dL   Reason for Visit: Patient admitted with DKA.  Of note CO2 down and Anion Gap widening this am.  Discussed with resident.  For recheck of BMET 1510.  If Anion Gap is greater than 12, recommend restarting insulin drip.    Spoke to patient at length by herself.  She states that CBG's are often "Hi" on her meter.  She reports increased stress this week with being locked out of her house.  She states she has meter and does check CBG's. Her goal is to sign up for college classes at [employer].  Last A1C was greater than 20.  Needs much follow-up.  Patient would likely qualify for medication assistance through drug manufacturers such as [employer] or [employer].  Will explore these options with patient on 03/20/12.  Spoke to patient regarding her motivation to care for herself.  She was quiet but admitted that she feels very tired when her blood sugars are high.  Discussed long term consequences of diabetes and she states she does not want to "lose limbs". Will see patient on 03/20/12.

## 2012-03-19 NOTE — Progress Notes (Signed)
Clinical Social Work Department BRIEF PSYCHOSOCIAL ASSESSMENT 03/19/2012  Patient:  Ashley Freeman, Ashley Freeman     Account Number:  [id number]     Admit date:  03/18/2012  Clinical Social Worker:  Earlie Server  Date/Time:  03/19/2012 03:30 PM  Referred by:  Physician  Date Referred:  03/19/2012 Referred for  Other - See comment   Other Referral:   Interview type:  Patient Other interview type:    PSYCHOSOCIAL DATA Living Status:  FAMILY Admitted from facility:   Level of care:   Primary support name:  Latisha Primary support relationship to patient:  PARENT Degree of support available:   Adequate    CURRENT CONCERNS Current Concerns  Other - See comment   Other Concerns:   Referral to assist with Medicaid and medication assistance    SOCIAL WORK ASSESSMENT / PLAN CSW received referral to assist with Medicaid and medication assistance. CSW reviewed chart and met with patient at bedside. Patient's significant other was at bedside. Patient agreeable to visitor involvement in assessment.    CSW introduced myself and explained role. Patient reports that she is feeling better but needs assistance with Medicaid. Patient had Medicaid in the past but has not applied lately. CSW gave patient referral to Department of Social Services. Patient is aware of facility and agreeable to follow up to complete application at dc. Patient reports no further needs at this time. CSW received inappropriate referral for medication assistance. CSW made CM aware of needs.    CSW is signing off but available if needed.   Assessment/plan status:  No Further Intervention Required Other assessment/ plan:   Information/referral to community resources:   Referral to Department of Social Services    PATIENT'S/FAMILY'S RESPONSE TO PLAN OF CARE: Patient alert and oriented. Patient agreeable to assessment. Patient engaged throughout session and agreeable to follow up with DSS at dc.

## 2012-03-19 NOTE — Progress Notes (Signed)
Visit to patient today while in hospital. Note she was admitted for DKA.  Patient finishing lunch and was not very talkative. Her female friend, Reita Cliche answered questions for her explaining that she has been making her insulin last longer by skipping it a day or so periodically because she cannot afford it since her Medicaid was stopped. Suggested trying to plan out options for what to do in the future when something like this happens. Since patient was not communicating much at visit, made arrangements to visit patient at a later time to discuss. Was also informed that we do not have her correct phone contact. Encouraged patient to leave her new contact number on CDE voicemail at her convenience.

## 2012-03-19 NOTE — H&P (Signed)
INTERNAL MEDICINE TEACHING SERVICE Attending Admission Note  Date: 03/19/2012  Patient name: Ashley Freeman  Medical record number: [id number]  Date of birth: 12-Sep-1990    I have seen and evaluated Ashley Freeman and discussed their care with the Residency Team.  64 yr. Old [name] w/ pmhx significant for IDDM type 1, DKA x3 in the past, nonadherence, presented due to polyuria and hyperglycemia. The patient presented to [facility] ED and was transferred to [facility] for further treatment. She stated she had been locked out of her home and could not obtain her insulin. I discussed this with her and her fiancee in the room (with the patient's permission) and he states that she does not take her insulin regularly and she was locked out of her house for only one day.  Her pharmacy was called and apparently she has not filled her insulin since February. Her fiancee admits she tries to "make her insulin last". She admits she is having issues with medicaid and would like some assistance. She denies CP, SOB, N/V/D, fever, chills, cough. She admits to polyuria and fatigue.  On admission she was found to have a BG of over 800 on admission and an AG of 34. She has been treated with IVF and insulin with improvement, but still noted to have an AG this morning.  Physical Exam: Blood pressure 104/61, pulse 96, temperature 97.8 F (36.6 C), temperature source Oral, resp. rate 25, height 5\' 3"  (1.6 m), weight 123 lb 7.3 oz (56 kg), last menstrual period 01/19/2012, SpO2 97.00%, not currently breastfeeding.  General: Vital signs reviewed and noted. Well-developed, well-nourished, in no acute distress; alert, appropriate and cooperative throughout examination.  Head: Normocephalic, atraumatic.  Eyes: PERRL, EOMI, No signs of anemia or jaundince. Dry mucous membranes.  Nose: Mucous membranes dry, not inflammed, nonerythematous.  Throat: Oropharynx nonerythematous, no exudate appreciated.   Neck: No deformities, masses, or  tenderness noted.Supple, No carotid Bruits, no JVD.  Lungs:  Normal respiratory effort. Clear to auscultation BL without crackles or wheezes.  Heart: RRR. S1 and S2 normal without gallop, murmur, or rubs.  Abdomen:  BS normoactive. Soft, Nondistended, non-tender.  No masses or organomegaly.  Extremities: No pretibial edema.  Neurologic: A&O X3, CN II - XII are grossly intact. Motor strength is 5/5 in the all 4 extremities, Sensations intact to light touch, Cerebellar signs negative.  Skin: No visible rashes, scars.    Lab results: Results for orders placed during the hospital encounter of 03/18/12 (from the past 24 hour(s))  GLUCOSE, CAPILLARY     Status: Abnormal   Collection Time   03/18/12  2:58 PM      Component Value Range   Glucose-Capillary 222 (*) 70 - 99 mg/dL  CBC     Status: Abnormal   Collection Time   03/18/12  3:00 PM      Component Value Range   WBC 13.7 (*) 4.0 - 10.5 K/uL   RBC 4.06  3.87 - 5.11 MIL/uL   Hemoglobin 11.6 (*) 12.0 - 15.0 g/dL   HCT 34.7 (*) 36.0 - 46.0 %   MCV 85.5  78.0 - 100.0 fL   MCH 28.6  26.0 - 34.0 pg   MCHC 33.4  30.0 - 36.0 g/dL   RDW 14.3  11.5 - 15.5 %   Platelets 341  150 - 400 K/uL  COMPREHENSIVE METABOLIC PANEL     Status: Abnormal   Collection Time   03/18/12  3:00 PM  Component Value Range   Sodium 133 (*) 135 - 145 mEq/L   Potassium 4.4  3.5 - 5.1 mEq/L   Chloride 100  96 - 112 mEq/L   CO2 11 (*) 19 - 32 mEq/L   Glucose, Bld 246 (*) 70 - 99 mg/dL   BUN 14  6 - 23 mg/dL   Creatinine, Ser 0.61  0.50 - 1.10 mg/dL   Calcium 9.3  8.4 - 10.5 mg/dL   Total Protein 7.2  6.0 - 8.3 g/dL   Albumin 3.5  3.5 - 5.2 g/dL   AST 30  0 - 37 U/L   ALT 21  0 - 35 U/L   Alkaline Phosphatase 90  39 - 117 U/L   Total Bilirubin 0.4  0.3 - 1.2 mg/dL   GFR calc non Af Amer >90  >90 mL/min   GFR calc Af Amer >90  >90 mL/min  TSH     Status: Normal   Collection Time   03/18/12  3:01 PM      Component Value Range   TSH 0.384  0.350 - 4.500  uIU/mL  MRSA PCR SCREENING     Status: Normal   Collection Time   03/18/12  3:14 PM      Component Value Range   MRSA by PCR NEGATIVE  NEGATIVE  GLUCOSE, CAPILLARY     Status: Abnormal   Collection Time   03/18/12  4:00 PM      Component Value Range   Glucose-Capillary 179 (*) 70 - 99 mg/dL  BASIC METABOLIC PANEL     Status: Abnormal   Collection Time   03/18/12  4:29 PM      Component Value Range   Sodium 136  135 - 145 mEq/L   Potassium 4.0  3.5 - 5.1 mEq/L   Chloride 106  96 - 112 mEq/L   CO2 12 (*) 19 - 32 mEq/L   Glucose, Bld 186 (*) 70 - 99 mg/dL   BUN 12  6 - 23 mg/dL   Creatinine, Ser 0.61  0.50 - 1.10 mg/dL   Calcium 8.5  8.4 - 10.5 mg/dL   GFR calc non Af Amer >90  >90 mL/min   GFR calc Af Amer >90  >90 mL/min  GLUCOSE, CAPILLARY     Status: Abnormal   Collection Time   03/18/12  5:02 PM      Component Value Range   Glucose-Capillary 162 (*) 70 - 99 mg/dL  GLUCOSE, CAPILLARY     Status: Abnormal   Collection Time   03/18/12  5:58 PM      Component Value Range   Glucose-Capillary 177 (*) 70 - 99 mg/dL  BASIC METABOLIC PANEL     Status: Abnormal   Collection Time   03/18/12  6:34 PM      Component Value Range   Sodium 133 (*) 135 - 145 mEq/L   Potassium 4.0  3.5 - 5.1 mEq/L   Chloride 106  96 - 112 mEq/L   CO2 11 (*) 19 - 32 mEq/L   Glucose, Bld 180 (*) 70 - 99 mg/dL   BUN 10  6 - 23 mg/dL   Creatinine, Ser 0.52  0.50 - 1.10 mg/dL   Calcium 8.4  8.4 - 10.5 mg/dL   GFR calc non Af Amer >90  >90 mL/min   GFR calc Af Amer >90  >90 mL/min  GLUCOSE, CAPILLARY     Status: Abnormal   Collection Time   03/18/12  6:58 PM  Component Value Range   Glucose-Capillary 189 (*) 70 - 99 mg/dL  GLUCOSE, CAPILLARY     Status: Abnormal   Collection Time   03/18/12  8:01 PM      Component Value Range   Glucose-Capillary 161 (*) 70 - 99 mg/dL  BASIC METABOLIC PANEL     Status: Abnormal   Collection Time   03/18/12  9:10 PM      Component Value Range   Sodium 134  (*) 135 - 145 mEq/L   Potassium 3.5  3.5 - 5.1 mEq/L   Chloride 107  96 - 112 mEq/L   CO2 16 (*) 19 - 32 mEq/L   Glucose, Bld 150 (*) 70 - 99 mg/dL   BUN 8  6 - 23 mg/dL   Creatinine, Ser 0.49 (*) 0.50 - 1.10 mg/dL   Calcium 8.6  8.4 - 10.5 mg/dL   GFR calc non Af Amer >90  >90 mL/min   GFR calc Af Amer >90  >90 mL/min  GLUCOSE, CAPILLARY     Status: Abnormal   Collection Time   03/18/12  9:15 PM      Component Value Range   Glucose-Capillary 140 (*) 70 - 99 mg/dL  GLUCOSE, CAPILLARY     Status: Abnormal   Collection Time   03/18/12 10:23 PM      Component Value Range   Glucose-Capillary 120 (*) 70 - 99 mg/dL  GLUCOSE, CAPILLARY     Status: Abnormal   Collection Time   03/18/12 11:34 PM      Component Value Range   Glucose-Capillary 184 (*) 70 - 99 mg/dL  GLUCOSE, CAPILLARY     Status: Abnormal   Collection Time   03/19/12 12:42 AM      Component Value Range   Glucose-Capillary 249 (*) 70 - 99 mg/dL  GLUCOSE, CAPILLARY     Status: Abnormal   Collection Time   03/19/12  1:29 AM      Component Value Range   Glucose-Capillary 214 (*) 70 - 99 mg/dL  BASIC METABOLIC PANEL     Status: Abnormal   Collection Time   03/19/12  4:25 AM      Component Value Range   Sodium 128 (*) 135 - 145 mEq/L   Potassium 5.9 (*) 3.5 - 5.1 mEq/L   Chloride 102  96 - 112 mEq/L   CO2 13 (*) 19 - 32 mEq/L   Glucose, Bld 321 (*) 70 - 99 mg/dL   BUN 8  6 - 23 mg/dL   Creatinine, Ser 0.52  0.50 - 1.10 mg/dL   Calcium 8.4  8.4 - 10.5 mg/dL   GFR calc non Af Amer >90  >90 mL/min   GFR calc Af Amer >90  >90 mL/min  GLUCOSE, CAPILLARY     Status: Abnormal   Collection Time   03/19/12  8:14 AM      Component Value Range   Glucose-Capillary 381 (*) 70 - 99 mg/dL  BASIC METABOLIC PANEL     Status: Abnormal   Collection Time   03/19/12  8:55 AM      Component Value Range   Sodium 129 (*) 135 - 145 mEq/L   Potassium 3.9  3.5 - 5.1 mEq/L   Chloride 97  96 - 112 mEq/L   CO2 13 (*) 19 - 32 mEq/L    Glucose, Bld 419 (*) 70 - 99 mg/dL   BUN 7  6 - 23 mg/dL   Creatinine, Ser 0.53  0.50 -  1.10 mg/dL   Calcium 9.3  8.4 - 10.5 mg/dL   GFR calc non Af Amer >90  >90 mL/min   GFR calc Af Amer >90  >90 mL/min  PHOSPHORUS     Status: Abnormal   Collection Time   03/19/12  8:55 AM      Component Value Range   Phosphorus 2.1 (*) 2.3 - 4.6 mg/dL  GLUCOSE, CAPILLARY     Status: Abnormal   Collection Time   03/19/12 11:38 AM      Component Value Range   Glucose-Capillary 233 (*) 70 - 99 mg/dL   Comment 1 Documented in Chart    BASIC METABOLIC PANEL     Status: Abnormal   Collection Time   03/19/12 12:28 PM      Component Value Range   Sodium 131 (*) 135 - 145 mEq/L   Potassium 3.7  3.5 - 5.1 mEq/L   Chloride 101  96 - 112 mEq/L   CO2 16 (*) 19 - 32 mEq/L   Glucose, Bld 242 (*) 70 - 99 mg/dL   BUN 7  6 - 23 mg/dL   Creatinine, Ser 0.57  0.50 - 1.10 mg/dL   Calcium 8.8  8.4 - 10.5 mg/dL   GFR calc non Af Amer >90  >90 mL/min   GFR calc Af Amer >90  >90 mL/min    Imaging results:  Dg Chest 2 View  03/18/2012  *RADIOLOGY REPORT*  Clinical Data: Shortness of breath.  CHEST - 2 VIEW  Comparison: 02/18/2012  Findings: Lungs are clear.  Cardiomediastinal silhouette and remainder of the exam is unchanged.  IMPRESSION: No acute cardiopulmonary disease.   Original Report Authenticated By: Marin Olp, M.D.      Assessment and Plan: I agree with the formulated Assessment and Plan with the following changes: 21 yr. Old [name] w/ pmhx significant for IDDM type 1, DKA x3 in the past, nonadherence, presented due to polyuria and hyperglycemia, found to be in DKA. 1) DKA: I would continue IVF at this time and follow her electrolytes closely. Pay special attention to K and Phos and replete as necessary. Agree with [place] insulin use, Lantus and Novolog for now. Consult case management to help her with affording insulin and with her medicaid application issues.  Follow her BMP, Phos frequently every 4 hours.  Her leukocytosis is likely secondary to white blood cell demargination, I don't see any clinical evidence of infection. Monitor.   Dominic Pea, DO 11/20/20132:18 PM

## 2012-03-20 LAB — BASIC METABOLIC PANEL
BUN: 7 mg/dL (ref 6–23)
BUN: 6 mg/dL (ref 6–23)
BUN: 8 mg/dL (ref 6–23)
CO2: 19 mEq/L (ref 19–32)
CO2: 22 mEq/L (ref 19–32)
Calcium: 8.3 mg/dL — ABNORMAL LOW (ref 8.4–10.5)
Calcium: 8 mg/dL — ABNORMAL LOW (ref 8.4–10.5)
Calcium: 8.6 mg/dL (ref 8.4–10.5)
Calcium: 9.6 mg/dL (ref 8.4–10.5)
Chloride: 99 mEq/L (ref 96–112)
Creatinine, Ser: 0.44 mg/dL — ABNORMAL LOW (ref 0.50–1.10)
Creatinine, Ser: 0.54 mg/dL (ref 0.50–1.10)
Creatinine, Ser: 0.4 mg/dL — ABNORMAL LOW (ref 0.50–1.10)
GFR calc Af Amer: >90 mL/min (ref >90)
GFR calc Af Amer: >90 mL/min (ref >90)
GFR calc non Af Amer: >90 mL/min (ref >90)
GFR calc non Af Amer: >90 mL/min (ref >90)
GFR calc non Af Amer: >90 mL/min (ref >90)
Glucose, Bld: 99 mg/dL (ref 70–99)
Glucose, Bld: 137 mg/dL — ABNORMAL HIGH (ref 70–99)
Glucose, Bld: 310 mg/dL — ABNORMAL HIGH (ref 70–99)
Glucose, Bld: 111 mg/dL — ABNORMAL HIGH (ref 70–99)
Potassium: 3.8 mEq/L (ref 3.5–5.1)

## 2012-03-20 LAB — GLUCOSE, CAPILLARY
Glucose-Capillary: 132 mg/dL — ABNORMAL HIGH (ref 70–99)
Glucose-Capillary: 144 mg/dL — ABNORMAL HIGH (ref 70–99)
Glucose-Capillary: 300 mg/dL — ABNORMAL HIGH (ref 70–99)
Glucose-Capillary: 64 mg/dL — ABNORMAL LOW (ref 70–99)
Glucose-Capillary: 64 mg/dL — ABNORMAL LOW (ref 70–99)
Glucose-Capillary: 93 mg/dL (ref 70–99)

## 2012-03-20 LAB — PHOSPHORUS
Phosphorus: 3.9 mg/dL (ref 2.3–4.6)
Phosphorus: 3.4 mg/dL (ref 2.3–4.6)
Phosphorus: 3.7 mg/dL (ref 2.3–4.6)

## 2012-03-20 MED ORDER — POTASSIUM CHLORIDE CRYS ER 20 MEQ PO TBCR
40.0000 meq | EXTENDED_RELEASE_TABLET | Freq: Once | ORAL | Status: AC
  Administered 2012-03-20: 40 meq via ORAL
  Filled 2012-03-20: qty 2

## 2012-03-20 MED ORDER — ACETAMINOPHEN 325 MG PO TABS
325.0000 mg | ORAL_TABLET | Freq: Four times a day (QID) | ORAL | Status: DC | PRN
  Administered 2012-03-20: 325 mg via ORAL
  Filled 2012-03-20: qty 2

## 2012-03-20 MED ORDER — INSULIN ASPART PROT & ASPART (70-30 MIX) 100 UNIT/ML ~~LOC~~ SUSP
16.0000 [IU] | Freq: Two times a day (BID) | SUBCUTANEOUS | Status: DC
  Administered 2012-03-20 – 2012-03-21 (×2): 16 [IU] via SUBCUTANEOUS
  Filled 2012-03-20 (×2): qty 3

## 2012-03-20 MED ORDER — POTASSIUM CHLORIDE CRYS ER 20 MEQ PO TBCR
40.0000 meq | EXTENDED_RELEASE_TABLET | ORAL | Status: AC
  Administered 2012-03-20 (×2): 40 meq via ORAL
  Filled 2012-03-20 (×2): qty 2

## 2012-03-20 MED ORDER — POTASSIUM CHLORIDE IN NACL 20-0.9 MEQ/L-% IV SOLN
INTRAVENOUS | Status: DC
  Filled 2012-03-20 (×5): qty 1000

## 2012-03-20 NOTE — Progress Notes (Signed)
INTERNAL MEDICINE TEACHING SERVICE Attending Note  Date: 03/20/2012  Patient name: Ashley Freeman  Medical record number: [id number]  Date of birth: August 16, 1990    This patient has been seen and discussed with the house staff. Please see their note for complete details. I concur with their findings with the following additions/corrections: Noted to have an episode of hypoglycemia in past 24 hrs with shakiness, BG 60's. Denies SOB, CP, N/V/D/C. No cough. Tolerating PO diet.  A/P: 76 yr. Old [name] w/ pmhx significant for IDDM type 1, DKA x3 in the past, nonadherence, presented due to polyuria and hyperglycemia, found to be in DKA.  1) DKA: Resolved. AG closed. Change insulin to 70/30, 16 units twice daily. She will need to discuss with CM affordability. Follow electrolytes and replete as necessary. No evidence of infection, DKA was likely due to nonadherence with therapy. -expect D/C tomorrow. She will need close follow up in clinic and to establish with an endocrinologist.   Dominic Pea, DO  03/20/2012, 1:38 PM

## 2012-03-20 NOTE — Progress Notes (Signed)
Checked pt's CBG this morning and was 64, ginger ale, crackers, and peanut butter given. MD made aware.

## 2012-03-20 NOTE — Progress Notes (Signed)
Subjective:  Ashley Freeman is doing well. She denies nausea, vomiting, abd pain, dizziness. She is tolerating PO without difficulty. She did have an episode of shakiness overnight, BS was 60s, she was given some snacks and it went up appropriately. No complaints   Objective: Vital signs in last 24 hours: Filed Vitals:   03/19/12 2015 03/19/12 2335 03/20/12 0319 03/20/12 0748  BP: 118/78 103/61 96/43 108/64  Pulse: 103 103 99 92  Temp: 97.9 F (36.6 C) 97.6 F (36.4 C) 98.3 F (36.8 C) 98 F (36.7 C)  TempSrc: Oral Oral Oral Oral  Resp: 20 26 22 21   Height:      Weight:  132 lb 4.4 oz (60 kg)    SpO2: 100% 100% 100% 99%   Weight change: 8 lb 2.5 oz (3.7 kg)  Intake/Output Summary (Last 24 hours) at 03/20/12 1147 Last data filed at 03/20/12 1000  Gross per 24 hour  Intake   3500 ml  Output      0 ml  Net   3500 ml    Physical Exam Blood pressure 108/64, pulse 92, temperature 98 F (36.7 C), temperature source Oral, resp. rate 21, height 5\' 3"  (1.6 m), weight 132 lb 4.4 oz (60 kg), last menstrual period 01/19/2012, SpO2 99.00%, not currently breastfeeding. General:  No acute distress, alert and oriented x 3, well-appearing  HEENT:  PERRL, EOMI, moist mucous membranes Cardiovascular:  Regular rate and rhythm, no murmurs, rubs or gallops Respiratory:  Clear to auscultation bilaterally, no wheezes, rales, or rhonchi Abdomen:  Soft, nondistended, nontender, bowel sounds present Extremities:  Warm and well-perfused, no edema.  Skin: Warm, dry, no rashes Neuro: flat affect  Lab Results: CBC    Component Value Date/Time   WBC 13.7* 03/18/2012 1500   RBC 4.06 03/18/2012 1500   HGB 11.6* 03/18/2012 1500   HCT 34.7* 03/18/2012 1500   PLT 341 03/18/2012 1500   MCV 85.5 03/18/2012 1500   MCH 28.6 03/18/2012 1500   MCHC 33.4 03/18/2012 1500   RDW 14.3 03/18/2012 1500   LYMPHSABS 1.5 03/18/2012 0855   MONOABS 0.3 03/18/2012 0855   EOSABS 0.0 03/18/2012 0855   BASOSABS 0.1  03/18/2012 0855    BMET    Component Value Date/Time   NA 138 03/20/2012 0630   K 3.4* 03/20/2012 0630   CL 105 03/20/2012 0630   CO2 18* 03/20/2012 0630   GLUCOSE 137* 03/20/2012 0630   BUN 7 03/20/2012 0630   CREATININE 0.48* 03/20/2012 0630   CREATININE 0.59 11/06/2011 1556   CALCIUM 8.0* 03/20/2012 0630   GFRNONAA >90 03/20/2012 0630   GFRAA >90 03/20/2012 0630     Micro Results: Recent Results (from the past 240 hour(s))  MRSA PCR SCREENING     Status: Normal   Collection Time   03/18/12  3:14 PM      Component Value Range Status Comment   MRSA by PCR NEGATIVE  NEGATIVE Final     Studies/Results: Dg Chest 2 View  03/18/2012  *RADIOLOGY REPORT*  Clinical Data: Shortness of breath.  CHEST - 2 VIEW  Comparison: 02/18/2012  Findings: Lungs are clear.  Cardiomediastinal silhouette and remainder of the exam is unchanged.  IMPRESSION: No acute cardiopulmonary disease.   Original Report Authenticated By: Marin Olp, M.D.     Medications: medications reviewed Scheduled Meds:    . enoxaparin  40 mg Subcutaneous Q24H  . insulin aspart  0-9 Units Subcutaneous TID WC  . insulin aspart protamine-insulin aspart  16 Units  Subcutaneous BID WC  . [COMPLETED] phosphorus  500 mg Oral TID  . [COMPLETED] potassium chloride  40 mEq Oral Q4H  . [DISCONTINUED] insulin NPH  16 Units Subcutaneous BID WC  . [DISCONTINUED] insulin NPH  20 Units Subcutaneous BID WC  . [DISCONTINUED] potassium chloride  40 mEq Oral Once   Continuous Infusions:    . 0.9 % NaCl with KCl 20 mEq / L    . [DISCONTINUED] sodium chloride 1,000 mL (03/20/12 0940)   PRN Meds:.dextrose  Assessment/Plan: Patient Active Hospital Problem List: No active hospital problems.  Pt is a 21 y.o. yo female with a PMHx of very poorly controlled DM1 (last A1c >20 in 01/2012), history of noncompliance with insulin who was admitted on 03/18/2012 with symptoms of nausea, polydipsia, which was determined to be secondary to  DKA (with admission AG of 34). Interventions at this time will be focused on treatment of her DKA and better defining reason/ ways to help address frequent medication noncompliance.   1) Diabetic Ketoacidosis - resolving. Likely secondary to medication noncompliance (out of insulin x 2 days and did not call PCP). On admission, there was a severe metabolic acidosis with AG of 34 with delta-delta of 1.3, indicating pure AG metabolic acidosis. Serum acetone and urine ketones are (+). This is the patient's 4th admission in 2013 for DKA in setting of medication noncompliance. Patient otherwise denies drug abuse such as cocaine. Denies excessive alcohol abuse. No nausea or vomiting. *after calling pharmacy, found out that patient has not filled her insulin Rx since February 2013. Boyfriend states that she "makes her insulin last a long time." Patient admits that finances are a limiting factor 11/20: Patient's gap open this morning, AG of 13 then 19, however, the labs were drawn shortly before her home NPH was restarted. 1525 gap was 13. Blood glucose is trending down. Restarted on her home dose of 20 u NPH tonight with dinner. Patient appears euvolemic, eating full meals without difficulty. 11/21: episode of hypoglycemia, BS ranging from 64-144 in the pas 10 hours. Patient without complaints. Anion gap is still 15 this AM, however, her blood sugars are low. K continues to be low, will replete  -discussed changing insulin regimen to novolog 70/30 16 u BID -monitor CBGs -cont to follow BMP & phos, space to q12 now -long discussion by myself and multiple other staff members about insulin nonadherence, cont to encourage and education -will need close f/u in clinic -cont IVF at 100cc/h -follow K and phos and replete as needed  Leukocytosis - likely secondary to stress demargination in setting of acute DKA. UA negative for evidence of UTI.  - Will monitor, and continue to monitor for signs/ symptoms of  infection.   Hyperkalemia - resolved. Likely 2/2 #1 and insulin deficiency. - K is 3.4 this morning, will replete PO -will add K to IVF  DVT -lovenox  Dispo -patient will need assistance affording medications -she needs to f/u closely in clinic, likely weekly at first to get better control of DM, discussed this with patient and she is willing -she does have transportation needs as she doesn't have a car and her mother works   LOS: 2 days   Santa Lighter 03/20/2012, 11:47 AM

## 2012-03-20 NOTE — Progress Notes (Signed)
Pt arrived to unit from 2600. Pt is alert and oriented. No complaints of pain. VSS. Skin intact. Oriented pt to unit. Call bell within reach. Pt sitting comfortably upright in the chair. Will continue to monitor.

## 2012-03-20 NOTE — Progress Notes (Signed)
Called patient in room 2614. She prefers Ashley Freeman visit her tomorrow rather than today. She says she does not currently have a phone number as hers is not working, but we can call her mother to get in touch with her. Current phone number listed is for her mother.  Suggest and will encourage patient to apply for orange card while waiting for decision on Medicaid to help with cost of medicine and diabetes supplies.

## 2012-03-20 NOTE — Progress Notes (Signed)
Inpatient Diabetes Program Recommendations  AACE/ADA: New Consensus Statement on Inpatient Glycemic Control (2013)  Target Ranges:  Prepandial:   less than 140 mg/dL      Peak postprandial:   less than 180 mg/dL (1-2 hours)      Critically ill patients:  140 - 180 mg/dL   Reason for Visit: Patient admitted with DKA. Note low CBG this morning. Patient transitioned to 70/30 insulin today.  Agree that this may be a simpler regimen for her.  Discussed importance of taking insulin and eating consistently with this regimen.  Also instructed her to check her CBG's 3-4 times daily.  Agree with weekly follow-up with clinic.  Patient was able to teach back information presented to patient.

## 2012-03-20 NOTE — Progress Notes (Signed)
Around 2300 pt feeling jittery and CBG was 78, juice given. Pt again feeling jittery around 0115 and CBG 64, juice, crackers, and peanut butter given. Denies nausea. Last CBG 108. Dr. Pricilla Handler notified and aware.

## 2012-03-21 DIAGNOSIS — D649 Anemia, unspecified: Secondary | ICD-10-CM

## 2012-03-21 LAB — CBC
MCH: 27.9 pg (ref 26.0–34.0)
MCV: 85.5 fL (ref 78.0–100.0)
Platelets: 273 10*3/uL (ref 150–400)
RDW: 14.3 % (ref 11.5–15.5)
WBC: 6.2 10*3/uL (ref 4.0–10.5)

## 2012-03-21 LAB — BASIC METABOLIC PANEL
CO2: 21 mEq/L (ref 19–32)
Calcium: 9.5 mg/dL (ref 8.4–10.5)
Creatinine, Ser: 0.5 mg/dL (ref 0.50–1.10)
GFR calc non Af Amer: >90 mL/min (ref >90)
Glucose, Bld: 343 mg/dL — ABNORMAL HIGH (ref 70–99)
Potassium: 3.7 mEq/L (ref 3.5–5.1)
Sodium: 135 mEq/L (ref 135–145)

## 2012-03-21 LAB — GLUCOSE, CAPILLARY: Glucose-Capillary: 302 mg/dL — ABNORMAL HIGH (ref 70–99)

## 2012-03-21 MED ORDER — INSULIN ASPART PROT & ASPART (70-30 MIX) 100 UNIT/ML ~~LOC~~ SUSP: 18.0000 [IU] | Freq: Two times a day (BID) | SUBCUTANEOUS | Status: DC

## 2012-03-21 MED ORDER — INSULIN ASPART PROT & ASPART (70-30 MIX) 100 UNIT/ML ~~LOC~~ SUSP: 16.0000 [IU] | Freq: Two times a day (BID) | SUBCUTANEOUS | Status: DC

## 2012-03-21 NOTE — Discharge Summary (Signed)
Internal [facility] Discharge Note  Name: Ashley Freeman MRN: [id number] DOB: 08-05-1990 21 y.o.  Date of Admission: 03/18/2012  6:44 AM Date of Discharge: 03/21/2012 Attending Physician: Dominic Pea, DO  Discharge Diagnosis: Principal Problem:  *Diabetes mellitus type 1 (uncontrolled) Active Problems:  DKA (diabetic ketoacidoses)  Leukocytosis  Anemia   Discharge Medications:   Medication List     As of 03/21/2012 12:46 PM    STOP taking these medications         insulin NPH 100 UNIT/ML injection   Commonly known as: HUMULIN N,NOVOLIN N      TAKE these medications         insulin aspart 100 UNIT/ML injection   Commonly known as: novoLOG   Inject 3-10 Units into the skin 3 (three) times daily with meals. Sliding scale      insulin aspart protamine-insulin aspart (70-30) 100 UNIT/ML injection   Commonly known as: NOVOLOG 70/30   Inject 18 Units into the skin 2 (two) times daily with a meal.      traMADol 50 MG tablet   Commonly known as: ULTRAM   Take 1 tablet (50 mg total) by mouth every 6 (six) hours as needed for pain.        Disposition and follow-up:   Ashley Freeman was discharged from [facility] in stable condition.  At the hospital follow up visit please address the following:  -weekly appointments in clinic for close monitoring of DM. Discussed bringing meter and all meds to each apointment -check adherence to 70/30 18u BID regimen as the simplest possible plan for now, she will need to add correction scale and adjust regimen accordingly as outpatient. This is not an ideal regimen but we need a simple routine she can do for now to get her A1c below 20 -info given for lantus assistance program, she said she will apply online for financial assistance to move toward a better regimen -she will need a referral to endocrine for their assistance in managing diabetes -application for  medicaid   Follow-up Appointments:     Follow-up Information    Call to follow up. (To apply for Medicaid)       Follow up with Ashley Genre, MD. (Next Tuesday Nov 26 at 1:15 pm)    Contact information:   [address] [place] [address] [phone]         Discharge Orders    Future Appointments: Provider: Department: Dept Phone: Center:   03/25/2012 1:15 PM Ashley Genre, MD [place] [phone] [facility]     Future Orders Please Complete By Expires   Diet Carb Modified      Scheduling Instructions:   Diabetic diet with eating a light lunch   Increase activity slowly         Consultations:  none  Procedures Performed:  Dg Chest 2 View  03/18/2012  *RADIOLOGY REPORT*  Clinical Data: Shortness of breath.  CHEST - 2 VIEW  Comparison: 02/18/2012  Findings: Lungs are clear.  Cardiomediastinal silhouette and remainder of the exam is unchanged.  IMPRESSION: No acute cardiopulmonary disease.   Original Report Authenticated By: Marin Olp, M.D.     Admission HPI: Patient is a 21 y.o. female with a PMHx of very poorly controlled Type 1 diabetes mellitus (HgbA1c > 20 in 01/2012) with 3 prior DKA admissions in 2013 (last 01/2012) and significant insulin noncompliance who presented to [facility] ED, and  subsequently transferred to [facility] for evaluation and treatment of hyperglycemia. The patient indicates that she has been out of her insulin for the last 2 days secondary to being locked out of her house, where the insulin is kept. She otherwise dates that she is feeling well without any nausea, vomiting, confusion, lethargy. She confirms polyuria. The patient also denies recent cough, congestion, dysuria, hematuria, vaginal lesions or discharge. Denies sick contacts. The patient states that she does not use any alcohol or illicit drugs.   During the [facility] long ER course, the patient was noted to have a presenting of greater than 800, as well  she had a metabolic acidosis with an anion gap of 34. The patient was initiated on command her protocol and transferred to [facility] for continued care.   Of note, the patient has lapsed her Medicaid eligibility, and her boyfriend is currently helping with medication assistance.    Hospital Course by problem list: Principal Problem:  *Diabetes mellitus type 1 (uncontrolled) Active Problems:  DKA (diabetic ketoacidoses)  Leukocytosis  Anemia   DKA in Type 1 DM Patient with type 1 DM came in with metabolic acidosis, anion gap of 34, serum acetone and urine ketones +, with blood sugar of >800. She had not taken insulin 2 days prior to admission. Patient's A1c is >20 and she has had several admissions for DKA in the past year. Patient was given IV fluids, started on gluccomander protocol with insulin drip, and transferred to our care. Insulin drip was continued until blood sugar was stable and anion gap was closed. Her home NPH was started and an hour later the insulin drop was discontinued. Patient's diet was advanced and she appeared euvolemic on exam. After much discussion with the patient, the inpatient diabetes coordinator, and the medicine team, it was decided to change her insulin regimen to 70/30. This is not an ideal regimen for a type one diabetic but it became apparent that patient compliance with a regimen was a major concern and the simplicity of using this insulin BID to initially get her A1c decreased was attractive. Upon calling the pharmacy, the patient had apparently not filled her insulin rx since February of 2013. She admitted that due to financial strain, she was unable to afford her medication. We discussed at length with patient the importance of using insulin and having regular follow up to titrate this regimen, and hopefully switch to lantus with meal coverage when medicaid restarts. She was scheduled for close f/u in [facility] clinic where she will hopefully come to weekly appointments  in order to get her diabetes under control. On the day of discharge, she voiced understanding and agreement with the above plan. She had no complaints and her DKA had resolved. No evidence of infectious trigger.   Discharge Vitals:  BP 114/77  Pulse 93  Temp 98.1 F (36.7 C) (Oral)  Resp 18  Ht 5\' 3"  (1.6 m)  Wt 134 lb 14.7 oz (61.2 kg)  BMI 23.90 kg/m2  SpO2 97%  LMP 01/19/2012  Breastfeeding? No  Discharge Labs:  Results for orders placed during the hospital encounter of 03/18/12 (from the past 24 hour(s))  GLUCOSE, CAPILLARY     Status: Normal   Collection Time   03/20/12  4:46 PM      Component Value Range   Glucose-Capillary 93  70 - 99 mg/dL   Comment 1 Notify RN     Comment 2 Documented in Chart    BASIC METABOLIC PANEL  Status: Abnormal   Collection Time   03/20/12  6:07 PM      Component Value Range   Sodium 133 (*) 135 - 145 mEq/L   Potassium 3.8  3.5 - 5.1 mEq/L   Chloride 99  96 - 112 mEq/L   CO2 22  19 - 32 mEq/L   Glucose, Bld 111 (*) 70 - 99 mg/dL   BUN 8  6 - 23 mg/dL   Creatinine, Ser 0.40 (*) 0.50 - 1.10 mg/dL   Calcium 9.6  8.4 - 10.5 mg/dL   GFR calc non Af Amer >90  >90 mL/min   GFR calc Af Amer >90  >90 mL/min  PHOSPHORUS     Status: Normal   Collection Time   03/20/12  6:07 PM      Component Value Range   Phosphorus 3.7  2.3 - 4.6 mg/dL  GLUCOSE, CAPILLARY     Status: Abnormal   Collection Time   03/20/12  9:00 PM      Component Value Range   Glucose-Capillary 300 (*) 70 - 99 mg/dL  GLUCOSE, CAPILLARY     Status: Abnormal   Collection Time   03/20/12 10:59 PM      Component Value Range   Glucose-Capillary 269 (*) 70 - 99 mg/dL  GLUCOSE, CAPILLARY     Status: Abnormal   Collection Time   03/21/12  7:31 AM      Component Value Range   Glucose-Capillary 302 (*) 70 - 99 mg/dL  BASIC METABOLIC PANEL     Status: Abnormal   Collection Time   03/21/12 10:30 AM      Component Value Range   Sodium 135  135 - 145 mEq/L   Potassium 3.7   3.5 - 5.1 mEq/L   Chloride 98  96 - 112 mEq/L   CO2 21  19 - 32 mEq/L   Glucose, Bld 343 (*) 70 - 99 mg/dL   BUN 7  6 - 23 mg/dL   Creatinine, Ser 0.50  0.50 - 1.10 mg/dL   Calcium 9.5  8.4 - 10.5 mg/dL   GFR calc non Af Amer >90  >90 mL/min   GFR calc Af Amer >90  >90 mL/min  CBC     Status: Abnormal   Collection Time   03/21/12 10:30 AM      Component Value Range   WBC 6.2  4.0 - 10.5 K/uL   RBC 4.01  3.87 - 5.11 MIL/uL   Hemoglobin 11.2 (*) 12.0 - 15.0 g/dL   HCT 34.3 (*) 36.0 - 46.0 %   MCV 85.5  78.0 - 100.0 fL   MCH 27.9  26.0 - 34.0 pg   MCHC 32.7  30.0 - 36.0 g/dL   RDW 14.3  11.5 - 15.5 %   Platelets 273  150 - 400 K/uL  GLUCOSE, CAPILLARY     Status: Normal   Collection Time   03/21/12 11:28 AM      Component Value Range   Glucose-Capillary 70  70 - 99 mg/dL    Signed: Santa Lighter 03/21/2012, 12:46 PM   Time Spent on Discharge: 30 min Services Ordered on Discharge: none Equipment Ordered on Discharge: none

## 2012-03-21 NOTE — Progress Notes (Signed)
Subjective:  Ashley Freeman is doing well this morning. States she is ready to go home. She had some brown diarrhea this morning, which she has at home sometimes intermittently. No fever, chills, abd pain, SOB, dizziness, shakiness. Tolerating PO without problems.  Objective: Vital signs in last 24 hours: Filed Vitals:   03/20/12 1443 03/20/12 1830 03/20/12 2057 03/21/12 0514  BP: 117/84 116/80 121/83 99/65  Pulse: 92 90 89 82  Temp: 98.5 F (36.9 C) 98.4 F (36.9 C) 98.3 F (36.8 C) 98.2 F (36.8 C)  TempSrc: Oral Oral Oral Oral  Resp: 18 18 18 20   Height:      Weight:   134 lb 14.7 oz (61.2 kg)   SpO2: 100% 100% 98% 97%   Weight change: 2 lb 10.3 oz (1.2 kg)  Intake/Output Summary (Last 24 hours) at 03/21/12 0804 Last data filed at 03/21/12 0515  Gross per 24 hour  Intake   2230 ml  Output   1551 ml  Net    679 ml    Physical Exam Blood pressure 99/65, pulse 82, temperature 98.2 F (36.8 C), temperature source Oral, resp. rate 20, height 5\' 3"  (1.6 m), weight 134 lb 14.7 oz (61.2 kg), last menstrual period 01/19/2012, SpO2 97.00%, not currently breastfeeding. General:  No acute distress, alert and oriented x 3, well-appearing  HEENT:  PERRL, EOMI, moist mucous membranes Cardiovascular:  Regular rate and rhythm, no murmurs, rubs or gallops Respiratory:  Clear to auscultation bilaterally, no wheezes, rales, or rhonchi Abdomen:  Soft, nondistended, nontender, bowel sounds present Extremities:  Warm and well-perfused, no edema.  Skin: Warm, dry, no rashes Neuro: wnl  Lab Results: CBC    Component Value Date/Time   WBC 13.7* 03/18/2012 1500   RBC 4.06 03/18/2012 1500   HGB 11.6* 03/18/2012 1500   HCT 34.7* 03/18/2012 1500   PLT 341 03/18/2012 1500   MCV 85.5 03/18/2012 1500   MCH 28.6 03/18/2012 1500   MCHC 33.4 03/18/2012 1500   RDW 14.3 03/18/2012 1500   LYMPHSABS 1.5 03/18/2012 0855   MONOABS 0.3 03/18/2012 0855   EOSABS 0.0 03/18/2012 0855   BASOSABS 0.1  03/18/2012 0855    BMET    Component Value Date/Time   NA 133* 03/20/2012 1807   K 3.8 03/20/2012 1807   CL 99 03/20/2012 1807   CO2 22 03/20/2012 1807   GLUCOSE 111* 03/20/2012 1807   BUN 8 03/20/2012 1807   CREATININE 0.40* 03/20/2012 1807   CREATININE 0.59 11/06/2011 1556   CALCIUM 9.6 03/20/2012 1807   GFRNONAA >90 03/20/2012 1807   GFRAA >90 03/20/2012 1807     Micro Results: Recent Results (from the past 240 hour(s))  MRSA PCR SCREENING     Status: Normal   Collection Time   03/18/12  3:14 PM      Component Value Range Status Comment   MRSA by PCR NEGATIVE  NEGATIVE Final     Studies/Results: No results found.  Medications: medications reviewed Scheduled Meds:    . enoxaparin  40 mg Subcutaneous Q24H  . insulin aspart  0-9 Units Subcutaneous TID WC  . insulin aspart protamine-insulin aspart  16 Units Subcutaneous BID WC  . [COMPLETED] potassium chloride  40 mEq Oral Once  . [DISCONTINUED] insulin NPH  20 Units Subcutaneous BID WC   Continuous Infusions:    . [DISCONTINUED] sodium chloride Stopped (03/20/12 1730)  . [DISCONTINUED] 0.9 % NaCl with KCl 20 mEq / L Stopped (03/20/12 2012)   PRN Meds:.acetaminophen, dextrose  Assessment/Plan:  Pt is a 21 y.o. yo female with a PMHx of very poorly controlled DM1 (last A1c >20 in 01/2012), history of noncompliance with insulin who was admitted on 03/18/2012 with symptoms of nausea, polydipsia, which was determined to be secondary to DKA (with admission AG of 34). Interventions at this time will be focused on treatment of her DKA and better defining reason/ ways to help address frequent medication noncompliance.   1) Diabetic Ketoacidosis - Resolved. Likely secondary to medication noncompliance (out of insulin x 2 days and did not call PCP). On admission, there was a severe metabolic acidosis with AG of 34 with delta-delta of 1.3, indicating pure AG metabolic acidosis. Serum acetone and urine ketones are (+). This is  the patient's 4th admission in 2013 for DKA in setting of medication noncompliance. Patient otherwise denies drug abuse such as cocaine. Denies excessive alcohol abuse. No nausea or vomiting. *after calling pharmacy, found out that patient has not filled her insulin Rx since February 2013. Boyfriend states that she "makes her insulin last a long time." Patient admits that finances are a limiting factor 11/20: Patient's gap open this morning, AG of 13 then 19, however, the labs were drawn shortly before her home NPH was restarted. 1525 gap was 13. Blood glucose is trending down. Restarted on her home dose of 20 u NPH tonight with dinner. Patient appears euvolemic, eating full meals without difficulty. 11/21: episode of hypoglycemia, BS ranging from 64-144 in the pas 10 hours. Patient without complaints. Anion gap is still 15 this AM, however, her blood sugars are low. K continues to be low, will replete  -cont novolog 70/30 16 u BID, see below -monitor CBGs -long discussion by myself and multiple other staff members about insulin nonadherence, cont to encourage and education -will need close f/u in clinic  DM Type 1 Poorly controlled, last A1c >20. Difficult situation as patient is noncompliant with insulin regimens and has trouble affording her medication. We have discussed the benefits of using insulin and encouraged the patient to follow up in clinic weekly to monitor progress and continue to adjust regimen. 11/21: changed regimen to novolog 70/30. 11/22: BS over night ranged from 100-300  -cont 70/30 16u BID -correction scale with lunch and bed time -will need very close f/u in clinic, hopefully patient will show for weekly appointments  Leukocytosis - likely secondary to stress demargination in setting of acute DKA. UA negative for evidence of UTI.  - Will monitor, and continue to monitor for signs/ symptoms of infection.   Hyperkalemia - resolved. Likely 2/2 #1 and insulin  deficiency.  DVT -lovenox  Dispo -patient will need assistance affording medications -she needs to f/u closely in clinic, likely weekly at first to get better control of DM, discussed this with patient and she is willing -she does have transportation needs as she doesn't have a car and her mother works   LOS: 3 days   Ashley Freeman 03/21/2012, 8:04 AM

## 2012-03-21 NOTE — Progress Notes (Signed)
Discussed discharge instructions with pt. Pt showed no barriers to discharge. IV removed. Tele removed. Pt discharged to home with mother. Assessment unchanged from morning.

## 2012-03-21 NOTE — Progress Notes (Signed)
INTERNAL MEDICINE TEACHING SERVICE Attending Note  Date: 03/21/2012  Patient name: Ashley Freeman  Medical record number: [id number]  Date of birth: 06-Jan-1991    This patient has been seen and discussed with the house staff. Please see their note for complete details. I concur with their findings with the following additions/corrections: Some diarrhea this morning, one episode. No CP, SOB, N/V, abdominal pain. She is eating without difficulty. Feels ready to go home. I stressed the importance of insulin use. I advised her to call our clinic or our CM if she has any difficulty obtaining insulin, she verbalized understanding. I explained to her the consequences of uncontrolled DM and the risk of DKA and being critically ill, she verbalized understanding. She states cost was the main issue.  A/P: 43 yr. Old [name] w/ pmhx significant for IDDM type 1, DKA x3 in the past, nonadherence, presented due to polyuria and hyperglycemia, found to be in DKA.  1) DKA: Resolved. AG closed. No evidence of infection as trigger, this was secondary to nonadherence to treatment. 2) IDDM type 1: Agree with D/C on 70/30, 18 units bid for simplicity, cost, and to increase adherence. Ideally she would need Lantus and meal coverage but cost is an issue. She will need to establish with endocrinology. She will need to have CM help her fill out paperwork for orange card and medicaid to put her on a preferred regimen that she can afford. Novolog led to some symptomatic hypoglycemia while inpatient, would avoid Novolog. She can have regular insulin started as an outpatient with close follow up. Stable for D/C home today.  Dominic Pea, DO  03/21/2012, 3:16 PM

## 2012-03-25 ENCOUNTER — Encounter: Payer: Self-pay | Admitting: Internal Medicine

## 2012-03-26 ENCOUNTER — Encounter: Payer: Self-pay | Admitting: Internal Medicine

## 2012-03-26 NOTE — Discharge Summary (Signed)
INTERNAL MEDICINE TEACHING SERVICE Attending Note  Date: 03/26/2012  Patient name: Ashley Freeman  Medical record number: [id number]  Date of birth: 1990-07-19   This patient has been seen and discussed with the house staff. Please see their note for complete details. I concur with their findings and plan.   Dominic Pea, DO  03/26/2012, 5:03 PM

## 2012-04-01 ENCOUNTER — Ambulatory Visit (INDEPENDENT_AMBULATORY_CARE_PROVIDER_SITE_OTHER): Payer: Medicaid Other | Admitting: Internal Medicine

## 2012-04-01 DIAGNOSIS — E109 Type 1 diabetes mellitus without complications: Secondary | ICD-10-CM

## 2012-04-01 NOTE — Progress Notes (Signed)
Opened on accident. Patient checked in an hour early, and then left the clinic prior to being seen at her appointment time.

## 2012-04-04 ENCOUNTER — Ambulatory Visit: Payer: Medicaid Other | Admitting: Internal Medicine

## 2012-04-11 ENCOUNTER — Encounter (INDEPENDENT_AMBULATORY_CARE_PROVIDER_SITE_OTHER): Payer: Medicaid Other | Admitting: Ophthalmology

## 2012-05-06 ENCOUNTER — Encounter (HOSPITAL_COMMUNITY): Payer: Self-pay | Admitting: Emergency Medicine

## 2012-05-06 ENCOUNTER — Emergency Department (HOSPITAL_COMMUNITY): Payer: Medicaid Other

## 2012-05-06 ENCOUNTER — Inpatient Hospital Stay (HOSPITAL_COMMUNITY)
Admission: EM | Admit: 2012-05-06 | Discharge: 2012-05-08 | DRG: 638 | Disposition: A | Discharge status: Home / Self Care | Payer: Medicaid Other | Attending: Internal Medicine | Admitting: Internal Medicine

## 2012-05-06 DIAGNOSIS — Z794 Long term (current) use of insulin: Secondary | ICD-10-CM

## 2012-05-06 DIAGNOSIS — E1110 Type 2 diabetes mellitus with ketoacidosis without coma: Secondary | ICD-10-CM

## 2012-05-06 DIAGNOSIS — R112 Nausea with vomiting, unspecified: Secondary | ICD-10-CM

## 2012-05-06 DIAGNOSIS — B9789 Other viral agents as the cause of diseases classified elsewhere: Secondary | ICD-10-CM | POA: Diagnosis present

## 2012-05-06 DIAGNOSIS — E873 Alkalosis: Secondary | ICD-10-CM | POA: Diagnosis present

## 2012-05-06 DIAGNOSIS — E109 Type 1 diabetes mellitus without complications: Secondary | ICD-10-CM

## 2012-05-06 DIAGNOSIS — E1010 Type 1 diabetes mellitus with ketoacidosis without coma: Principal | ICD-10-CM | POA: Diagnosis present

## 2012-05-06 DIAGNOSIS — E872 Acidosis, unspecified: Secondary | ICD-10-CM | POA: Diagnosis present

## 2012-05-06 DIAGNOSIS — E1065 Type 1 diabetes mellitus with hyperglycemia: Secondary | ICD-10-CM

## 2012-05-06 DIAGNOSIS — E876 Hypokalemia: Secondary | ICD-10-CM | POA: Diagnosis present

## 2012-05-06 DIAGNOSIS — J069 Acute upper respiratory infection, unspecified: Secondary | ICD-10-CM | POA: Diagnosis present

## 2012-05-06 LAB — COMPREHENSIVE METABOLIC PANEL
Alkaline Phosphatase: 110 U/L (ref 39–117)
BUN: 9 mg/dL (ref 6–23)
Calcium: 10.7 mg/dL — ABNORMAL HIGH (ref 8.4–10.5)
Chloride: 82 mEq/L — ABNORMAL LOW (ref 96–112)
Creatinine, Ser: 0.62 mg/dL (ref 0.50–1.10)
GFR calc Af Amer: >90 mL/min (ref >90)
Glucose, Bld: 746 mg/dL (ref 70–99)
Potassium: 4.5 mEq/L (ref 3.5–5.1)
Total Bilirubin: 0.3 mg/dL (ref 0.3–1.2)

## 2012-05-06 LAB — BASIC METABOLIC PANEL
Calcium: 9.2 mg/dL (ref 8.4–10.5)
GFR calc Af Amer: >90 mL/min (ref >90)
GFR calc non Af Amer: >90 mL/min (ref >90)
Glucose, Bld: 564 mg/dL (ref 70–99)
Sodium: 127 mEq/L — ABNORMAL LOW (ref 135–145)

## 2012-05-06 LAB — POCT I-STAT 3, VENOUS BLOOD GAS (G3P V)
Bicarbonate: 20.7 mEq/L (ref 20.0–24.0)
TCO2: 22 mmol/L (ref 0–100)
pH, Ven: 7.39 — ABNORMAL HIGH (ref 7.250–7.300)

## 2012-05-06 LAB — CBC WITH DIFFERENTIAL/PLATELET
Basophils Absolute: 0 10*3/uL (ref 0.0–0.1)
Eosinophils Relative: 1 % (ref 0–5)
Hemoglobin: 13.7 g/dL (ref 12.0–15.0)
Lymphs Abs: 2.2 10*3/uL (ref 0.7–4.0)
Monocytes Relative: 9 % (ref 3–12)
Neutro Abs: 6 10*3/uL (ref 1.7–7.7)
Neutrophils Relative %: 66 % (ref 43–77)
Platelets: 293 10*3/uL (ref 150–400)
RBC: 4.6 MIL/uL (ref 3.87–5.11)
RDW: 13.3 % (ref 11.5–15.5)

## 2012-05-06 LAB — GLUCOSE, CAPILLARY
Glucose-Capillary: 430 mg/dL — ABNORMAL HIGH (ref 70–99)
Glucose-Capillary: 466 mg/dL — ABNORMAL HIGH (ref 70–99)
Glucose-Capillary: >600 mg/dL (ref 70–99)

## 2012-05-06 LAB — POCT PREGNANCY, URINE: Preg Test, Ur: NEGATIVE

## 2012-05-06 MED ORDER — ONDANSETRON HCL 4 MG/2ML IJ SOLN
4.0000 mg | Freq: Once | INTRAMUSCULAR | Status: AC
  Administered 2012-05-06: 4 mg via INTRAVENOUS
  Filled 2012-05-06: qty 2

## 2012-05-06 MED ORDER — SODIUM CHLORIDE 0.9 % IV SOLN: Freq: Once | INTRAVENOUS | Status: DC

## 2012-05-06 MED ORDER — SODIUM CHLORIDE 0.9 % IV BOLUS (SEPSIS)
1500.0000 mL | Freq: Once | INTRAVENOUS | Status: AC
  Administered 2012-05-06: 1500 mL via INTRAVENOUS

## 2012-05-06 MED ORDER — SODIUM CHLORIDE 0.9 % IV SOLN: INTRAVENOUS | Status: DC

## 2012-05-06 MED ORDER — ONDANSETRON HCL 4 MG/2ML IJ SOLN: 4.0000 mg | Freq: Three times a day (TID) | INTRAMUSCULAR | Status: DC | PRN

## 2012-05-06 MED ORDER — DEXTROSE 50 % IV SOLN: 25.0000 mL | INTRAVENOUS | Status: DC | PRN

## 2012-05-06 MED ORDER — SODIUM CHLORIDE 0.9 % IV SOLN
INTRAVENOUS | Status: DC
  Administered 2012-05-06: 23:00:00 via INTRAVENOUS

## 2012-05-06 MED ORDER — SODIUM CHLORIDE 0.9 % IV SOLN
INTRAVENOUS | Status: DC
  Administered 2012-05-07: 3.7 [IU]/h via INTRAVENOUS
  Filled 2012-05-06: qty 1

## 2012-05-06 MED ORDER — SODIUM CHLORIDE 0.9 % IV BOLUS (SEPSIS)
1500.0000 mL | Freq: Once | INTRAVENOUS | Status: AC
  Administered 2012-05-07: 1500 mL via INTRAVENOUS

## 2012-05-06 MED ORDER — INSULIN REGULAR BOLUS VIA INFUSION
0.0000 [IU] | Freq: Three times a day (TID) | INTRAVENOUS | Status: DC
  Filled 2012-05-06: qty 10

## 2012-05-06 NOTE — ED Notes (Signed)
Attempted to draw blood from this patient.  i was unsuccessful with both the left and the right arms.  Sterile technique was used.  Patient tolerated well. Phlebotomy notifed.

## 2012-05-06 NOTE — ED Provider Notes (Signed)
History     CSN: [id number]  Arrival date & time 05/06/12  1738   None     Chief Complaint  Patient presents with  . Emesis  . Fever    (Consider location/radiation/quality/duration/timing/severity/associated sxs/prior treatment) HPI chief complaint: Vomiting. Onset: Yesterday. Not improved or worsened by anything. Severity: Mild. Timing: Positive. Context: Blood sugar is higher than normal at home. For signs and symptoms to review of systems. Regarding social history see nurse's notes. I have reviewed the patient's past medical, past surgical, past social history as well as medications and allergies.  Past Medical History  Diagnosis Date  . Gonorrhea 08/2011    Treated in 09/2011  . Diabetes mellitus 2001    Diagnosed at age 39 ; Type I    Past Surgical History  Procedure Date  . No past surgeries     Family History  Problem Relation Age of Onset  . Anesthesia problems Neg Hx   . Asthma Mother   . Gout Father   . Diabetes Paternal Grandmother     History  Substance Use Topics  . Smoking status: Never Smoker   . Smokeless tobacco: Never Used  . Alcohol Use: No    OB History    Grav Para Term Preterm Abortions TAB SAB Ect Mult Living   1 0   1  1   0      Review of Systems  Constitutional: Negative for fever and chills.  HENT: Positive for sore throat and rhinorrhea. Negative for hearing loss, ear pain, congestion, facial swelling, mouth sores, trouble swallowing, neck pain, neck stiffness, voice change, sinus pressure and ear discharge.   Eyes: Negative for pain, discharge and itching.  Respiratory: Negative for cough, chest tightness and shortness of breath.   Cardiovascular: Negative for chest pain, palpitations and leg swelling.  Gastrointestinal: Positive for nausea, vomiting and abdominal pain. Negative for diarrhea, constipation and blood in stool.  Genitourinary: Negative for dysuria, urgency, frequency, hematuria, flank pain, decreased urine volume,  vaginal bleeding, vaginal discharge, difficulty urinating, vaginal pain, menstrual problem and pelvic pain.  Musculoskeletal: Positive for myalgias. Negative for back pain and joint swelling.  Skin: Negative for rash and wound.  Neurological: Negative for dizziness, tremors, seizures, syncope, facial asymmetry, speech difficulty, weakness, light-headedness, numbness and headaches.  Hematological: Negative for adenopathy. Does not bruise/bleed easily.  Psychiatric/Behavioral: Negative for confusion and decreased concentration.    Allergies  Penicillins  Home Medications   Current Outpatient Rx  Name  Route  Sig  Dispense  Refill  . INSULIN ASPART 100 UNIT/ML [place] SOLN   Subcutaneous   Inject 3-10 Units into the skin 3 (three) times daily with meals. Sliding scale         . INSULIN ASPART PROT & ASPART (70-30) 100 UNIT/ML [place] SUSP   Subcutaneous   Inject 18 Units into the skin 2 (two) times daily with a meal.   10 mL   1     BP 128/86  Pulse 105  Temp 97.5 F (36.4 C) (Oral)  Resp 18  SpO2 100%  LMP 03/23/2012  Breastfeeding? No  Physical Exam  Constitutional: She is oriented to person, place, and time. She appears well-developed and well-nourished. No distress.  HENT:  Head: Normocephalic and atraumatic.  Right Ear: Hearing, tympanic membrane, external ear and ear canal normal.  Left Ear: Hearing, tympanic membrane, external ear and ear canal normal.  Nose: Nose normal.  Mouth/Throat: Uvula is midline, oropharynx is clear and moist and mucous  membranes are normal.  Eyes: Conjunctivae normal are normal. Right eye exhibits no discharge. Left eye exhibits no discharge. No scleral icterus.  Neck: Normal range of motion. Neck supple.  Cardiovascular: Normal rate, regular rhythm, normal heart sounds and intact distal pulses.   No murmur heard. Pulmonary/Chest: Effort normal and breath sounds normal. No respiratory distress. She has no wheezes. She has no rales.  Abdominal:  Soft. Bowel sounds are normal. She exhibits no distension and no mass. There is no hepatosplenomegaly. There is generalized tenderness. There is no rigidity, no rebound, no guarding, no CVA tenderness, no tenderness at McBurney's point and negative Murphy's sign. No hernia. Hernia confirmed negative in the ventral area.  Musculoskeletal: Normal range of motion. She exhibits no edema and no tenderness.  Lymphadenopathy:    She has no cervical adenopathy.  Neurological: She is alert and oriented to person, place, and time.  Skin: Skin is warm and dry. She is not diaphoretic.  Psychiatric: She has a normal mood and affect.    ED Course  Procedures (including critical care time)  Labs Reviewed  GLUCOSE, CAPILLARY - Abnormal; Notable for the following:    Glucose-Capillary >600 (*)     All other components within normal limits  COMPREHENSIVE METABOLIC PANEL - Abnormal; Notable for the following:    Sodium 124 (*)     Chloride 82 (*)     CO2 16 (*)     Glucose, Bld 746 (*)     Calcium 10.7 (*)     Total Protein 8.7 (*)     AST 38 (*)     All other components within normal limits  POCT I-STAT 3, BLOOD GAS (G3P V) - Abnormal; Notable for the following:    pH, Ven 7.390 (*)     pCO2, Ven 34.2 (*)     pO2, Ven 50.0 (*)     Acid-base deficit 4.0 (*)     All other components within normal limits  POCT PREGNANCY, URINE  CBC WITH DIFFERENTIAL  BLOOD GAS, VENOUS  URINALYSIS, ROUTINE W REFLEX MICROSCOPIC  BASIC METABOLIC PANEL  BASIC METABOLIC PANEL  BASIC METABOLIC PANEL  BASIC METABOLIC PANEL  BASIC METABOLIC PANEL  BASIC METABOLIC PANEL  BASIC METABOLIC PANEL  INFLUENZA PANEL BY PCR   Dg Chest 2 View  05/06/2012  *RADIOLOGY REPORT*  Clinical Data: Chest pain, shortness of breath  CHEST - 2 VIEW  Comparison: 03/18/2012  Findings: Lungs are clear. No pleural effusion or pneumothorax.  Cardiomediastinal silhouette is within normal limits.  Visualized osseous structures are within normal  limits.  IMPRESSION: Normal chest radiographs.   Original Report Authenticated By: Julian Hy, M.D.      1. DKA (diabetic ketoacidoses)   2. Nausea & vomiting       MDM  Patient is a well-appearing 22 year old female with a history type 1 diabetes presenting with 24 hours of nausea vomiting. Picture consistent with DKA. DKA order set implemented. Admitted to medicine teaching service.        Charlotte Sanes, MD 05/06/12 2330

## 2012-05-06 NOTE — ED Notes (Signed)
Pt c/o N/V and body aches with fever x 2 days; pt requesting pregnancy test and sts LMP was 2 months ago

## 2012-05-07 ENCOUNTER — Encounter (HOSPITAL_COMMUNITY): Payer: Self-pay | Admitting: *Deleted

## 2012-05-07 DIAGNOSIS — E876 Hypokalemia: Secondary | ICD-10-CM | POA: Diagnosis present

## 2012-05-07 DIAGNOSIS — E873 Alkalosis: Secondary | ICD-10-CM

## 2012-05-07 DIAGNOSIS — E1010 Type 1 diabetes mellitus with ketoacidosis without coma: Principal | ICD-10-CM

## 2012-05-07 DIAGNOSIS — B9789 Other viral agents as the cause of diseases classified elsewhere: Secondary | ICD-10-CM

## 2012-05-07 LAB — BASIC METABOLIC PANEL
BUN: 4 mg/dL — ABNORMAL LOW (ref 6–23)
BUN: 4 mg/dL — ABNORMAL LOW (ref 6–23)
BUN: 6 mg/dL (ref 6–23)
BUN: 6 mg/dL (ref 6–23)
BUN: 8 mg/dL (ref 6–23)
CO2: 15 mEq/L — ABNORMAL LOW (ref 19–32)
CO2: 19 mEq/L (ref 19–32)
CO2: 21 mEq/L (ref 19–32)
CO2: 20 mEq/L (ref 19–32)
CO2: 20 mEq/L (ref 19–32)
CO2: 19 mEq/L (ref 19–32)
CO2: 19 mEq/L (ref 19–32)
CO2: 22 mEq/L (ref 19–32)
Calcium: 8.2 mg/dL — ABNORMAL LOW (ref 8.4–10.5)
Calcium: 8.3 mg/dL — ABNORMAL LOW (ref 8.4–10.5)
Calcium: 8.4 mg/dL (ref 8.4–10.5)
Calcium: 8.5 mg/dL (ref 8.4–10.5)
Calcium: 9 mg/dL (ref 8.4–10.5)
Chloride: 100 mEq/L (ref 96–112)
Chloride: 102 mEq/L (ref 96–112)
Chloride: 102 mEq/L (ref 96–112)
Chloride: 102 mEq/L (ref 96–112)
Chloride: 102 mEq/L (ref 96–112)
Chloride: 95 mEq/L — ABNORMAL LOW (ref 96–112)
Chloride: 97 mEq/L (ref 96–112)
Chloride: 99 mEq/L (ref 96–112)
Creatinine, Ser: 0.52 mg/dL (ref 0.50–1.10)
Creatinine, Ser: 0.45 mg/dL — ABNORMAL LOW (ref 0.50–1.10)
Creatinine, Ser: 0.49 mg/dL — ABNORMAL LOW (ref 0.50–1.10)
GFR calc Af Amer: >90 mL/min (ref >90)
GFR calc Af Amer: >90 mL/min (ref >90)
GFR calc Af Amer: >90 mL/min (ref >90)
GFR calc Af Amer: >90 mL/min (ref >90)
GFR calc Af Amer: >90 mL/min (ref >90)
GFR calc Af Amer: >90 mL/min (ref >90)
GFR calc non Af Amer: >90 mL/min (ref >90)
GFR calc non Af Amer: >90 mL/min (ref >90)
GFR calc non Af Amer: >90 mL/min (ref >90)
GFR calc non Af Amer: >90 mL/min (ref >90)
GFR calc non Af Amer: >90 mL/min (ref >90)
Glucose, Bld: 197 mg/dL — ABNORMAL HIGH (ref 70–99)
Glucose, Bld: 115 mg/dL — ABNORMAL HIGH (ref 70–99)
Glucose, Bld: 215 mg/dL — ABNORMAL HIGH (ref 70–99)
Glucose, Bld: 133 mg/dL — ABNORMAL HIGH (ref 70–99)
Glucose, Bld: 70 mg/dL (ref 70–99)
Potassium: 3 mEq/L — ABNORMAL LOW (ref 3.5–5.1)
Potassium: 3.1 mEq/L — ABNORMAL LOW (ref 3.5–5.1)
Potassium: 3.7 mEq/L (ref 3.5–5.1)
Potassium: 3.9 mEq/L (ref 3.5–5.1)
Potassium: 3.9 mEq/L (ref 3.5–5.1)
Potassium: 3.9 mEq/L (ref 3.5–5.1)
Potassium: 3.9 mEq/L (ref 3.5–5.1)
Potassium: 4 mEq/L (ref 3.5–5.1)
Sodium: 132 mEq/L — ABNORMAL LOW (ref 135–145)
Sodium: 133 mEq/L — ABNORMAL LOW (ref 135–145)
Sodium: 134 mEq/L — ABNORMAL LOW (ref 135–145)
Sodium: 136 mEq/L (ref 135–145)
Sodium: 137 mEq/L (ref 135–145)
Sodium: 137 mEq/L (ref 135–145)
Sodium: 138 mEq/L (ref 135–145)

## 2012-05-07 LAB — CBC
Hemoglobin: 10.4 g/dL — ABNORMAL LOW (ref 12.0–15.0)
MCH: 28.6 pg (ref 26.0–34.0)
MCHC: 33.9 g/dL (ref 30.0–36.0)
MCV: 84.3 fL (ref 78.0–100.0)
Platelets: 232 10*3/uL (ref 150–400)
RBC: 3.64 MIL/uL — ABNORMAL LOW (ref 3.87–5.11)
WBC: 9 10*3/uL (ref 4.0–10.5)

## 2012-05-07 LAB — GLUCOSE, CAPILLARY
Glucose-Capillary: 271 mg/dL — ABNORMAL HIGH (ref 70–99)
Glucose-Capillary: 131 mg/dL — ABNORMAL HIGH (ref 70–99)
Glucose-Capillary: 320 mg/dL — ABNORMAL HIGH (ref 70–99)
Glucose-Capillary: 233 mg/dL — ABNORMAL HIGH (ref 70–99)
Glucose-Capillary: 136 mg/dL — ABNORMAL HIGH (ref 70–99)
Glucose-Capillary: 122 mg/dL — ABNORMAL HIGH (ref 70–99)
Glucose-Capillary: 168 mg/dL — ABNORMAL HIGH (ref 70–99)
Glucose-Capillary: 322 mg/dL — ABNORMAL HIGH (ref 70–99)
Glucose-Capillary: 212 mg/dL — ABNORMAL HIGH (ref 70–99)
Glucose-Capillary: 204 mg/dL — ABNORMAL HIGH (ref 70–99)
Glucose-Capillary: 101 mg/dL — ABNORMAL HIGH (ref 70–99)
Glucose-Capillary: 107 mg/dL — ABNORMAL HIGH (ref 70–99)
Glucose-Capillary: 131 mg/dL — ABNORMAL HIGH (ref 70–99)

## 2012-05-07 LAB — MAGNESIUM: Magnesium: 1.4 mg/dL — ABNORMAL LOW (ref 1.5–2.5)

## 2012-05-07 LAB — INFLUENZA PANEL BY PCR (TYPE A & B): Influenza A By PCR: NEGATIVE

## 2012-05-07 MED ORDER — ONDANSETRON HCL 4 MG/2ML IJ SOLN: 4.0000 mg | Freq: Four times a day (QID) | INTRAMUSCULAR | Status: DC | PRN

## 2012-05-07 MED ORDER — POTASSIUM CHLORIDE 10 MEQ/100ML IV SOLN
10.0000 meq | INTRAVENOUS | Status: AC
  Administered 2012-05-07 (×4): 10 meq via INTRAVENOUS
  Filled 2012-05-07: qty 400

## 2012-05-07 MED ORDER — DIPHENHYDRAMINE HCL 25 MG PO CAPS
25.0000 mg | ORAL_CAPSULE | Freq: Once | ORAL | Status: AC
  Administered 2012-05-08: 25 mg via ORAL
  Filled 2012-05-07: qty 1

## 2012-05-07 MED ORDER — ACETAMINOPHEN 325 MG PO TABS: 650.0000 mg | ORAL_TABLET | Freq: Four times a day (QID) | ORAL | Status: DC | PRN

## 2012-05-07 MED ORDER — GUAIFENESIN-DM 100-10 MG/5ML PO SYRP
5.0000 mL | ORAL_SOLUTION | ORAL | Status: DC | PRN
  Administered 2012-05-08: 5 mL via ORAL
  Filled 2012-05-07 (×2): qty 5

## 2012-05-07 MED ORDER — DEXTROSE 50 % IV SOLN: 25.0000 mL | INTRAVENOUS | Status: DC | PRN

## 2012-05-07 MED ORDER — INSULIN ASPART PROT & ASPART (70-30 MIX) 100 UNIT/ML ~~LOC~~ SUSP
18.0000 [IU] | Freq: Two times a day (BID) | SUBCUTANEOUS | Status: DC
  Filled 2012-05-07: qty 3

## 2012-05-07 MED ORDER — INSULIN GLARGINE 100 UNIT/ML ~~LOC~~ SOLN: 15.0000 [IU] | Freq: Every day | SUBCUTANEOUS | Status: DC

## 2012-05-07 MED ORDER — ENOXAPARIN SODIUM 40 MG/0.4ML ~~LOC~~ SOLN
40.0000 mg | SUBCUTANEOUS | Status: DC
  Filled 2012-05-07 (×2): qty 0.4

## 2012-05-07 MED ORDER — SODIUM CHLORIDE 0.9 % IV SOLN
INTRAVENOUS | Status: DC
  Administered 2012-05-07: 05:00:00 via INTRAVENOUS

## 2012-05-07 MED ORDER — INSULIN ASPART PROT & ASPART (70-30 MIX) 100 UNIT/ML ~~LOC~~ SUSP
18.0000 [IU] | Freq: Two times a day (BID) | SUBCUTANEOUS | Status: DC
  Administered 2012-05-07 – 2012-05-08 (×2): 18 [IU] via SUBCUTANEOUS
  Filled 2012-05-07: qty 3

## 2012-05-07 MED ORDER — POTASSIUM CHLORIDE 10 MEQ/100ML IV SOLN
10.0000 meq | INTRAVENOUS | Status: DC
  Administered 2012-05-07: 10 meq via INTRAVENOUS

## 2012-05-07 MED ORDER — PROMETHAZINE HCL 25 MG/ML IJ SOLN
25.0000 mg | Freq: Four times a day (QID) | INTRAMUSCULAR | Status: DC | PRN
  Filled 2012-05-07: qty 1

## 2012-05-07 MED ORDER — MORPHINE SULFATE 2 MG/ML IJ SOLN: 2.0000 mg | INTRAMUSCULAR | Status: DC | PRN

## 2012-05-07 MED ORDER — WHITE PETROLATUM GEL
Status: AC
  Administered 2012-05-07: 1
  Filled 2012-05-07: qty 5

## 2012-05-07 MED ORDER — DEXTROSE-NACL 5-0.45 % IV SOLN
INTRAVENOUS | Status: DC
  Administered 2012-05-07 – 2012-05-08 (×3): via INTRAVENOUS

## 2012-05-07 MED ORDER — INSULIN ASPART 100 UNIT/ML ~~LOC~~ SOLN: 0.0000 [IU] | Freq: Every day | SUBCUTANEOUS | Status: DC

## 2012-05-07 MED ORDER — ONDANSETRON HCL 4 MG PO TABS: 4.0000 mg | ORAL_TABLET | Freq: Four times a day (QID) | ORAL | Status: DC | PRN

## 2012-05-07 MED ORDER — INSULIN ASPART 100 UNIT/ML ~~LOC~~ SOLN
0.0000 [IU] | Freq: Three times a day (TID) | SUBCUTANEOUS | Status: DC
  Administered 2012-05-08: 5 [IU] via SUBCUTANEOUS

## 2012-05-07 MED ORDER — ACETAMINOPHEN 650 MG RE SUPP: 650.0000 mg | Freq: Four times a day (QID) | RECTAL | Status: DC | PRN

## 2012-05-07 MED ORDER — SODIUM CHLORIDE 0.9 % IV SOLN
INTRAVENOUS | Status: DC
  Administered 2012-05-07: 6.2 [IU]/h via INTRAVENOUS
  Filled 2012-05-07 (×2): qty 1

## 2012-05-07 MED ORDER — POTASSIUM CHLORIDE 10 MEQ/100ML IV SOLN
INTRAVENOUS | Status: AC
  Administered 2012-05-07: 10 meq
  Filled 2012-05-07: qty 100

## 2012-05-07 MED ORDER — MAGNESIUM SULFATE 50 % IJ SOLN
3.0000 g | Freq: Once | INTRAVENOUS | Status: AC
  Administered 2012-05-07: 3 g via INTRAVENOUS
  Filled 2012-05-07: qty 6

## 2012-05-07 MED ORDER — POTASSIUM CHLORIDE CRYS ER 20 MEQ PO TBCR
40.0000 meq | EXTENDED_RELEASE_TABLET | ORAL | Status: AC
  Administered 2012-05-07 (×2): 40 meq via ORAL
  Filled 2012-05-07 (×2): qty 2

## 2012-05-07 MED ORDER — POTASSIUM CHLORIDE 10 MEQ/100ML IV SOLN
INTRAVENOUS | Status: AC
  Filled 2012-05-07: qty 100

## 2012-05-07 MED ORDER — INSULIN GLARGINE 100 UNIT/ML ~~LOC~~ SOLN
15.0000 [IU] | Freq: Once | SUBCUTANEOUS | Status: AC
  Administered 2012-05-07: 15 [IU] via SUBCUTANEOUS

## 2012-05-07 MED ORDER — INSULIN ASPART 100 UNIT/ML ~~LOC~~ SOLN
0.0000 [IU] | SUBCUTANEOUS | Status: AC
  Administered 2012-05-08: 3 [IU] via SUBCUTANEOUS

## 2012-05-07 MED ORDER — POTASSIUM CHLORIDE 10 MEQ/100ML IV SOLN: 10.0000 meq | INTRAVENOUS | Status: DC

## 2012-05-07 MED ORDER — POTASSIUM CHLORIDE 10 MEQ/100ML IV SOLN
10.0000 meq | Freq: Once | INTRAVENOUS | Status: DC
  Administered 2012-05-07: 10 meq via INTRAVENOUS

## 2012-05-07 MED ORDER — SODIUM CHLORIDE 0.9 % IV SOLN
Freq: Once | INTRAVENOUS | Status: AC
  Administered 2012-05-07: 02:00:00 via INTRAVENOUS

## 2012-05-07 MED ORDER — MAGNESIUM OXIDE 400 (241.3 MG) MG PO TABS
400.0000 mg | ORAL_TABLET | Freq: Two times a day (BID) | ORAL | Status: DC
  Administered 2012-05-07: 400 mg via ORAL
  Filled 2012-05-07 (×2): qty 1

## 2012-05-07 NOTE — ED Provider Notes (Signed)
I saw and evaluated the patient, reviewed the resident's note and I agree with the findings and plan. Patient with hyperglycemia nausea vomiting and a mild DKA. Admitted to medicine for further treatment.  Ashley Freeman. Alvino Chapel, MD 05/07/12 1451

## 2012-05-07 NOTE — ED Notes (Signed)
Report given to stepdown rn.  Pt transported via stretcher with  Monitor to floor.  Nad.

## 2012-05-07 NOTE — Progress Notes (Signed)
Utilization review completed.  

## 2012-05-07 NOTE — ED Notes (Signed)
Report given to floor.  Pt states no longer feeling nauseated.

## 2012-05-07 NOTE — H&P (Signed)
Hospital Admission Note Date: 05/07/2012  Patient name: Ashley Freeman Medical record number: [id number] Date of birth: 02-14-1991 Age: 22 y.o. Gender: female PCP: Cresenciano Genre, MD   Service:  Internal Medicine Teaching Service   Attending Physician:  Dr. Gilles Chiquito    Chief Complaint:  Flulike symptoms, nausea, vomiting, hyperglycemia     History of Present Illness:  This is a 22 year old woman with poorly controlled type 1 diabetes mellitus (HgbA1c > 20 in 01/2012) with 4 prior DKA admissions in 2013 (last 02/2012) and significant insulin noncompliance.  She presented to [facility] ED for flulike symptoms, nausea, and vomiting. The last 2-3 days, she has been feeling sick with sore throat, dyspnea, fatigue, weakness, myalgias, headaches, and chills. Then this morning, she began feeling nauseated and vomited several times. For the last 2-3 days, her sugars have been high; running in the 200s to 300s. She reports compliance with her insulin regimen, which includes 18 units of 70/30 next twice a day and 3-10 units of insulin aspart with meals. On review of systems, she denies dizziness and diarrhea.    Review of Systems:   Constitutional: Positive for chills and malaise/fatigue.  HENT: Positive for sore throat. Negative for congestion and neck pain.   Eyes: Negative.   Respiratory: Positive for cough and shortness of breath. Negative for sputum production.   Cardiovascular: Negative.   Gastrointestinal: Positive for nausea and vomiting. Negative for abdominal pain, diarrhea, constipation and blood in stool.  Genitourinary: Negative.  Negative for dysuria and hematuria.  Musculoskeletal: Positive for myalgias. Negative for joint pain.  Skin: Negative.   Neurological: Positive for weakness and headaches. Negative for dizziness.  Endo/Heme/Allergies: Positive for polydipsia.     Medical History: Past Medical History  Diagnosis Date  . Gonorrhea 08/2011    Treated in 09/2011  .  Diabetes mellitus 2001    Diagnosed at age 87 ; Type I    Surgical History: Past Surgical History  Procedure Date  . No past surgeries     Home Medications: Current Outpatient Rx  Name  Route  Sig  Dispense  Refill  . INSULIN ASPART 100 UNIT/ML [place] SOLN   Subcutaneous   Inject 3-10 Units into the skin 3 (three) times daily with meals. Sliding scale         . INSULIN ASPART PROT & ASPART (70-30) 100 UNIT/ML [place] SUSP   Subcutaneous   Inject 18 Units into the skin 2 (two) times daily with a meal.   10 mL   1     Allergies: Allergies as of 05/06/2012 - Review Complete 05/06/2012  Allergen Reaction Noted  . Penicillins Hives 11/11/2010    Family History: Family History  Problem Relation Age of Onset  . Anesthesia problems Neg Hx   . Asthma Mother   . Gout Father   . Diabetes Paternal Grandmother     Social History: Social History  . Marital Status: Single    Spouse Name: N/A    Number of Children: 0  . Years of Education: 11th grade   Occupational History  .  housekeeper     Social History Main Topics  . Smoking status: Never Smoker   . Smokeless tobacco: Never Used  . Alcohol Use: No  . Drug Use: No  . Sexually Active: Yes    Birth Control/ Protection: None     Comment: As of 11/06/11 1 partner (female) not using protection   Social History Narrative   Patient lives in [place]  with boyfriend. Mother lives in [place] and Father lives in [place]. Patient works as a Secretary/administrator. Completed 11 grade. Patient 3 brothers     Physical exam: Filed Vitals:   05/06/12 2215  BP: 125/77  Pulse: 106  Temp: 98.2 F (36.8 C)  Resp: 18   GENERAL: well developed, well nourished; no acute distress HEAD: atraumatic, normocephalic EYES: pupils equal, round and reactive; sclera anicteric; normal conjunctiva EARS: canals patent and TMs normal bilaterally NOSE/THROAT: oropharynx clear, dry mucous membranes, pink gums, normal dentition NECK: supple, no carotid  bruits, thyroid normal in size and without palpable nodules LYMPH: no cervical or supraclavicular lymphadenopathy LUNGS: clear to auscultation bilaterally, normal work of breathing HEART: Tachycardic and regular rhythm; normal S1 and S2 without S3 or S4; no murmurs, rubs, or clicks PULSES: radial and dorsalis pedis 2+ and symmetric ABDOMEN: soft; mild, generalized tenderness; normal bowel sounds; no masses SKIN: warm, dry, intact, normal turgor, no rashes EXTREMITIES: no peripheral edema, clubbing, or cyanosis      Lab results: Basic Metabolic Panel:  Basename 05/07/12 0014 05/06/12 2239  NA 132* 127*  K 3.9 3.6  CL 95* 90*  CO2 17* 17*  GLUCOSE 453* 564*  BUN 8 9  CREATININE 0.49* 0.55  CALCIUM 9.0 9.2  MG -- --  PHOS -- --    Liver Function Tests:  [facility] 05/06/12 2021  AST 38*  ALT 24  ALKPHOS 110  BILITOT 0.3  PROT 8.7*  ALBUMIN 4.1    CBC:  Basename 05/06/12 2021  WBC 9.1  NEUTROABS 6.0  HGB 13.7  HCT 39.7  MCV 86.3  PLT 293    CBG:  Basename 05/07/12 0126 05/06/12 2357 05/06/12 2313 05/06/12 1754  GLUCAP 271* 430* 466* >600*    Urinalysis:  Pending    Imaging results: Dg Chest 2 View 05/06/2012   FINDINGS: Lungs are clear. No pleural effusion or pneumothorax.  Cardiomediastinal silhouette is within normal limits.  Visualized osseous structures are within normal limits.   IMPRESSION: Normal chest radiographs.    Assessment and Plan:  1.   Type 1 diabetes mellitus with DKA:  Precipitated by an acute viral illness rather than medication noncompliance, it seems.  Notably, she does have a history of medication noncompliance secondary to financial difficulties. At presentation, her anion gap was 26 with a bicarbonate of 16. Urinalysis is pending. DKA protocol was initiated with an insulin infusion and aggressive fluid hydration. Delta delta is between 1 and 2, suggesting mixed metabolic acidosis and alkalosis; this is likely from a contraction  alkalosis superimposed on her diabetic ketoacidosis. - Admit to SDU  - Initiate glucomander protocol - will transition to subQ insulin once gap closed and CBG < 250 consistently.  - Aggressive IVF (has already received 3L NS in ED), will provide at least additional 2 L.  - Will request for our [facility] DM Educator Butch Penny Plyler) to see the patient again to reinforce compliance. .  - Will need ongoing and continued effort towards diabetic education.  - 6 total runs of IV potassium chloride, 10 mEq each - 3 g IV magnesium sulfate infusing - Checking phosphorus level  2.   Viral illness:  Concerning for influenza, but could represent any number of viruses with a predilection for the upper respiratory tract. - Influenza PCR panel pending   3.   Hypokalemia:  Secondary to DKA. Treating as above.  4.   Hypomagnesemia:  Secondary to DKA. Treating as above. Checking phosphorus.  5.   Metabolic  alkalosis:  Initial delta-delta was 1.75, indicating a mixed metabolic acidosis and alkalosis. This is likely a contraction metabolic alkalosis from her vomiting and diuresis. After aggressive hydration, her delta-delta began to appropriately decreased.  6.   Prophylaxis:  [place] enoxaparin 40 mg daily   7.   Disposition:  Patient's PCP is Dr. Aundra Dubin and will need [facility] followup. Expected length of stay is greater than 2 days. She has some financial difficulties and will likely need social work's assistance.     Signed by:  Shann Medal. Juleen China, MD PGY-I, Internal Medicine  05/07/2012, 1:56 AM

## 2012-05-07 NOTE — H&P (Signed)
Internal Medicine Teaching Service Attending Note Date: 05/07/2012  Patient name: Ashley Freeman  Medical record number: [id number]  Date of birth: May 31, 1990   I have seen and evaluated [name] and discussed their care with the Residency Team.    [name] is a 22yo woman with Type 1 DM who presented to the hospital with a 2 day history of flu-like symptoms including cough, congestion, fatigue, sore throat, myalgias, headache, chills and nausea.  She reports that during this time she had a decreased appetite, but continued taking her insulin as prescribed.  She subsequently began having increase sugars on the day of admission and was found to be in DKA.  She has also had vomiting and inability to take in food in the last day.  She denies chest pain, change in urinary habits, diarrhea, abdominal pain, dizziness or passing out.  She takes 18 Units 70/30 BID and 3-10 units of insulin aspart with meals.   For further history, meds, allergies and ros, please see resident note.   Physical Exam: Blood pressure 115/75, pulse 93, temperature 97.3 F (36.3 C), temperature source Oral, resp. rate 18, height 5\' 4"  (1.626 m), weight 127 lb 10.3 oz (57.9 kg), last menstrual period 03/23/2012, SpO2 99.00%, not currently breastfeeding. General appearance: alert, cooperative and appears stated age Head: Normocephalic, without obvious abnormality, atraumatic Eyes: EOMI, anicteric scleara Throat: Dry MM improved Lungs: clear to auscultation bilaterally and no wheezing, normal work of breathing Heart: RR, NR, no murmur Abdomen: soft, NT, +BS Extremities: no edema Skin: warm, well perfused, no rash, no wound Neurologic: Grossly normal  Lab results: Results for orders placed during the hospital encounter of 05/06/12 (from the past 24 hour(s))  GLUCOSE, CAPILLARY     Status: Abnormal   Collection Time   05/06/12  5:54 PM      Component Value Range   Glucose-Capillary >600 (*) 70 - 99  mg/dL  POCT PREGNANCY, URINE     Status: Normal   Collection Time   05/06/12  6:17 PM      Component Value Range   Preg Test, Ur NEGATIVE  NEGATIVE  CBC WITH DIFFERENTIAL     Status: Normal   Collection Time   05/06/12  8:21 PM      Component Value Range   WBC 9.1  4.0 - 10.5 K/uL   RBC 4.60  3.87 - 5.11 MIL/uL   Hemoglobin 13.7  12.0 - 15.0 g/dL   HCT 39.7  36.0 - 46.0 %   MCV 86.3  78.0 - 100.0 fL   MCH 29.8  26.0 - 34.0 pg   MCHC 34.5  30.0 - 36.0 g/dL   RDW 13.3  11.5 - 15.5 %   Platelets 293  150 - 400 K/uL   Neutrophils Relative 66  43 - 77 %   Neutro Abs 6.0  1.7 - 7.7 K/uL   Lymphocytes Relative 24  12 - 46 %   Lymphs Abs 2.2  0.7 - 4.0 K/uL   Monocytes Relative 9  3 - 12 %   Monocytes Absolute 0.8  0.1 - 1.0 K/uL   Eosinophils Relative 1  0 - 5 %   Eosinophils Absolute 0.1  0.0 - 0.7 K/uL   Basophils Relative 0  0 - 1 %   Basophils Absolute 0.0  0.0 - 0.1 K/uL  COMPREHENSIVE METABOLIC PANEL     Status: Abnormal   Collection Time   05/06/12  8:21 PM  Component Value Range   Sodium 124 (*) 135 - 145 mEq/L   Potassium 4.5  3.5 - 5.1 mEq/L   Chloride 82 (*) 96 - 112 mEq/L   CO2 16 (*) 19 - 32 mEq/L   Glucose, Bld 746 (*) 70 - 99 mg/dL   BUN 9  6 - 23 mg/dL   Creatinine, Ser 0.62  0.50 - 1.10 mg/dL   Calcium 10.7 (*) 8.4 - 10.5 mg/dL   Total Protein 8.7 (*) 6.0 - 8.3 g/dL   Albumin 4.1  3.5 - 5.2 g/dL   AST 38 (*) 0 - 37 U/L   ALT 24  0 - 35 U/L   Alkaline Phosphatase 110  39 - 117 U/L   Total Bilirubin 0.3  0.3 - 1.2 mg/dL   GFR calc non Af Amer >90  >90 mL/min   GFR calc Af Amer >90  >90 mL/min  BASIC METABOLIC PANEL     Status: Abnormal   Collection Time   05/06/12 10:39 PM      Component Value Range   Sodium 127 (*) 135 - 145 mEq/L   Potassium 3.6  3.5 - 5.1 mEq/L   Chloride 90 (*) 96 - 112 mEq/L   CO2 17 (*) 19 - 32 mEq/L   Glucose, Bld 564 (*) 70 - 99 mg/dL   BUN 9  6 - 23 mg/dL   Creatinine, Ser 0.55  0.50 - 1.10 mg/dL   Calcium 9.2  8.4 - 10.5 mg/dL    GFR calc non Af Amer >90  >90 mL/min   GFR calc Af Amer >90  >90 mL/min  INFLUENZA PANEL BY PCR     Status: Normal   Collection Time   05/06/12 10:46 PM      Component Value Range   Influenza A By PCR NEGATIVE  NEGATIVE   Influenza B By PCR NEGATIVE  NEGATIVE   H1N1 flu by pcr NOT DETECTED  NOT DETECTED  POCT I-STAT 3, BLOOD GAS (G3P V)     Status: Abnormal   Collection Time   05/06/12 10:55 PM      Component Value Range   pH, Ven 7.390 (*) 7.250 - 7.300   pCO2, Ven 34.2 (*) 45.0 - 50.0 mmHg   pO2, Ven 50.0 (*) 30.0 - 45.0 mmHg   Bicarbonate 20.7  20.0 - 24.0 mEq/L   TCO2 22  0 - 100 mmol/L   O2 Saturation 85.0     Acid-base deficit 4.0 (*) 0.0 - 2.0 mmol/L   Sample type VENOUS    GLUCOSE, CAPILLARY     Status: Abnormal   Collection Time   05/06/12 11:13 PM      Component Value Range   Glucose-Capillary 466 (*) 70 - 99 mg/dL  GLUCOSE, CAPILLARY     Status: Abnormal   Collection Time   05/06/12 11:57 PM      Component Value Range   Glucose-Capillary 430 (*) 70 - 99 mg/dL  BASIC METABOLIC PANEL     Status: Abnormal   Collection Time   05/07/12 12:14 AM      Component Value Range   Sodium 132 (*) 135 - 145 mEq/L   Potassium 3.9  3.5 - 5.1 mEq/L   Chloride 95 (*) 96 - 112 mEq/L   CO2 17 (*) 19 - 32 mEq/L   Glucose, Bld 453 (*) 70 - 99 mg/dL   BUN 8  6 - 23 mg/dL   Creatinine, Ser 0.49 (*) 0.50 - 1.10 mg/dL  Calcium 9.0  8.4 - 10.5 mg/dL   GFR calc non Af Amer >90  >90 mL/min   GFR calc Af Amer >90  >90 mL/min  GLUCOSE, CAPILLARY     Status: Abnormal   Collection Time   05/07/12  1:26 AM      Component Value Range   Glucose-Capillary 271 (*) 70 - 99 mg/dL  BASIC METABOLIC PANEL     Status: Abnormal   Collection Time   05/07/12  2:17 AM      Component Value Range   Sodium 137  135 - 145 mEq/L   Potassium 3.1 (*) 3.5 - 5.1 mEq/L   Chloride 99  96 - 112 mEq/L   CO2 15 (*) 19 - 32 mEq/L   Glucose, Bld 197 (*) 70 - 99 mg/dL   BUN 6  6 - 23 mg/dL   Creatinine, Ser 0.52  0.50 -  1.10 mg/dL   Calcium 8.5  8.4 - 10.5 mg/dL   GFR calc non Af Amer >90  >90 mL/min   GFR calc Af Amer >90  >90 mL/min  GLUCOSE, CAPILLARY     Status: Abnormal   Collection Time   05/07/12  2:59 AM      Component Value Range   Glucose-Capillary 131 (*) 70 - 99 mg/dL   Comment 1 Notify RN     Comment 2 Documented in Chart    BASIC METABOLIC PANEL     Status: Abnormal   Collection Time   05/07/12  3:14 AM      Component Value Range   Sodium 138  135 - 145 mEq/L   Potassium 3.0 (*) 3.5 - 5.1 mEq/L   Chloride 102  96 - 112 mEq/L   CO2 19  19 - 32 mEq/L   Glucose, Bld 115 (*) 70 - 99 mg/dL   BUN 6  6 - 23 mg/dL   Creatinine, Ser 0.51  0.50 - 1.10 mg/dL   Calcium 8.4  8.4 - 10.5 mg/dL   GFR calc non Af Amer >90  >90 mL/min   GFR calc Af Amer >90  >90 mL/min  CBC     Status: Abnormal   Collection Time   05/07/12  3:14 AM      Component Value Range   WBC 9.0  4.0 - 10.5 K/uL   RBC 3.64 (*) 3.87 - 5.11 MIL/uL   Hemoglobin 10.4 (*) 12.0 - 15.0 g/dL   HCT 30.7 (*) 36.0 - 46.0 %   MCV 84.3  78.0 - 100.0 fL   MCH 28.6  26.0 - 34.0 pg   MCHC 33.9  30.0 - 36.0 g/dL   RDW 13.1  11.5 - 15.5 %   Platelets 232  150 - 400 K/uL  MAGNESIUM     Status: Abnormal   Collection Time   05/07/12  3:14 AM      Component Value Range   Magnesium 1.4 (*) 1.5 - 2.5 mg/dL  MRSA PCR SCREENING     Status: Normal   Collection Time   05/07/12  3:29 AM      Component Value Range   MRSA by PCR NEGATIVE  NEGATIVE  GLUCOSE, CAPILLARY     Status: Abnormal   Collection Time   05/07/12  4:09 AM      Component Value Range   Glucose-Capillary 162 (*) 70 - 99 mg/dL  BASIC METABOLIC PANEL     Status: Abnormal   Collection Time   05/07/12  5:00 AM  Component Value Range   Sodium 137  135 - 145 mEq/L   Potassium 3.7  3.5 - 5.1 mEq/L   Chloride 102  96 - 112 mEq/L   CO2 21  19 - 32 mEq/L   Glucose, Bld 215 (*) 70 - 99 mg/dL   BUN 6  6 - 23 mg/dL   Creatinine, Ser 0.49 (*) 0.50 - 1.10 mg/dL   Calcium 8.4  8.4 - 10.5  mg/dL   GFR calc non Af Amer >90  >90 mL/min   GFR calc Af Amer >90  >90 mL/min  GLUCOSE, CAPILLARY     Status: Abnormal   Collection Time   05/07/12  5:11 AM      Component Value Range   Glucose-Capillary 320 (*) 70 - 99 mg/dL  GLUCOSE, CAPILLARY     Status: Abnormal   Collection Time   05/07/12  6:14 AM      Component Value Range   Glucose-Capillary 233 (*) 70 - 99 mg/dL  GLUCOSE, CAPILLARY     Status: Abnormal   Collection Time   05/07/12  7:02 AM      Component Value Range   Glucose-Capillary 202 (*) 70 - 99 mg/dL   Comment 1 Notify RN    GLUCOSE, CAPILLARY     Status: Abnormal   Collection Time   05/07/12  7:58 AM      Component Value Range   Glucose-Capillary 156 (*) 70 - 99 mg/dL   Comment 1 Documented in Chart     Comment 2 Notify RN    PHOSPHORUS     Status: Abnormal   Collection Time   05/07/12  8:47 AM      Component Value Range   Phosphorus 1.8 (*) 2.3 - 4.6 mg/dL  BASIC METABOLIC PANEL     Status: Abnormal   Collection Time   05/07/12  8:49 AM      Component Value Range   Sodium 136  135 - 145 mEq/L   Potassium 4.0  3.5 - 5.1 mEq/L   Chloride 102  96 - 112 mEq/L   CO2 20  19 - 32 mEq/L   Glucose, Bld 147 (*) 70 - 99 mg/dL   BUN 5 (*) 6 - 23 mg/dL   Creatinine, Ser 0.46 (*) 0.50 - 1.10 mg/dL   Calcium 8.2 (*) 8.4 - 10.5 mg/dL   GFR calc non Af Amer >90  >90 mL/min   GFR calc Af Amer >90  >90 mL/min  GLUCOSE, CAPILLARY     Status: Abnormal   Collection Time   05/07/12  9:03 AM      Component Value Range   Glucose-Capillary 136 (*) 70 - 99 mg/dL   Comment 1 Notify RN     Comment 2 Documented in Chart    GLUCOSE, CAPILLARY     Status: Abnormal   Collection Time   05/07/12 10:12 AM      Component Value Range   Glucose-Capillary 122 (*) 70 - 99 mg/dL   Comment 1 Documented in Chart     Comment 2 Notify RN    BASIC METABOLIC PANEL     Status: Abnormal   Collection Time   05/07/12 10:48 AM      Component Value Range   Sodium 133 (*) 135 - 145 mEq/L   Potassium 3.9   3.5 - 5.1 mEq/L   Chloride 101  96 - 112 mEq/L   CO2 20  19 - 32 mEq/L   Glucose, Bld 133 (*)  70 - 99 mg/dL   BUN 4 (*) 6 - 23 mg/dL   Creatinine, Ser 0.45 (*) 0.50 - 1.10 mg/dL   Calcium 8.1 (*) 8.4 - 10.5 mg/dL   GFR calc non Af Amer >90  >90 mL/min   GFR calc Af Amer >90  >90 mL/min  GLUCOSE, CAPILLARY     Status: Abnormal   Collection Time   05/07/12 11:21 AM      Component Value Range   Glucose-Capillary 168 (*) 70 - 99 mg/dL   Comment 1 Documented in Chart     Comment 2 Notify RN    GLUCOSE, CAPILLARY     Status: Abnormal   Collection Time   05/07/12 12:27 PM      Component Value Range   Glucose-Capillary 324 (*) 70 - 99 mg/dL   Comment 1 Documented in Chart     Comment 2 Notify RN    GLUCOSE, CAPILLARY     Status: Abnormal   Collection Time   05/07/12  1:30 PM      Component Value Range   Glucose-Capillary 322 (*) 70 - 99 mg/dL   Comment 1 Documented in Chart     Comment 2 Notify RN      Imaging results:  Dg Chest 2 View  05/06/2012  *RADIOLOGY REPORT*  Clinical Data: Chest pain, shortness of breath  CHEST - 2 VIEW  Comparison: 03/18/2012  Findings: Lungs are clear. No pleural effusion or pneumothorax.  Cardiomediastinal silhouette is within normal limits.  Visualized osseous structures are within normal limits.  IMPRESSION: Normal chest radiographs.   Original Report Authenticated By: Julian Hy, M.D.     Assessment and Plan: I agree with the formulated Assessment and Plan with the following changes:   1. DKA, with mixed metabolic acidosis and metabolic alkalosis - AG was initially > 20, improving - IVF with D5 NS, transition to D5 1/2 normal when patient volume even - Insulin drip until gap closes - q2 BMP - q1 CBG - Transition to SubQ insulin when glucose < 200 and gap closed - Diabetes Education - replace K aggressively - low Na likely pseudohyponatremia due to hyperglycemia  2. Viral illness - Check flu panel - Droplet precaution until panel  back  Further issues per resident note.   Sid Falcon, MD 1/8/20141:43 PM

## 2012-05-07 NOTE — Progress Notes (Signed)
Visit to patient while in hospital. Will call after she is discharged to assist with transition of care. 

## 2012-05-07 NOTE — Progress Notes (Signed)
Received referral for this patient.  Patient well known to the Inpatient Diabetes Program.  This DM Coordinator and another DM Coordinator have spoken extensively with this patient in the past about the importance of controlling her glucoses levels to prevent future chronic complications as well as acute complications like DKA (which patient has had several admissions for in the past) (see notes from 10/19/11, 10/22/11, and 03/20/12 from prior admits).  Patient sitting in chair.  Currently on IV insulin drip. Planning to transition off IV insulin drip today.  Asked patient about the events of the last few days.  Patient told me her CBGs have been running "High" for several weeks.  Patient told me she gives the max dose of SSI per her home scale when she gets a "High" reading.  Do not know if she follows up with a repeat reading.  Patient stated she has Medicaid but does not know what her co-pays are.  Can't remember the last time she went to the pharmacy.  Uses vial and syringe at home.   Patient stated she would prefer to use the insulin pens at home.  Would like a Rx for insulin pens and CBG meter strips for d/c.  Patient stated she has no more CBG meter strips at home.  Has used insulin pens in the past.  Noted patient also sees Barry Brunner, CDE for the Internal Medicine clinic as an outpatient.  Will follow. Wyn Quaker RN, MSN, CDE Diabetes Coordinator Inpatient Diabetes Program [phone]

## 2012-05-08 ENCOUNTER — Other Ambulatory Visit: Payer: Self-pay | Admitting: Internal Medicine

## 2012-05-08 LAB — GLUCOSE, CAPILLARY
Glucose-Capillary: 247 mg/dL — ABNORMAL HIGH (ref 70–99)
Glucose-Capillary: 236 mg/dL — ABNORMAL HIGH (ref 70–99)
Glucose-Capillary: 112 mg/dL — ABNORMAL HIGH (ref 70–99)
Glucose-Capillary: 234 mg/dL — ABNORMAL HIGH (ref 70–99)

## 2012-05-08 LAB — BASIC METABOLIC PANEL WITH GFR
BUN: 4 mg/dL — ABNORMAL LOW (ref 6–23)
CO2: 20 meq/L (ref 19–32)
Calcium: 8.5 mg/dL (ref 8.4–10.5)
Chloride: 103 meq/L (ref 96–112)
Creatinine, Ser: 0.41 mg/dL — ABNORMAL LOW (ref 0.50–1.10)
GFR calc Af Amer: >90 mL/min
GFR calc non Af Amer: >90 mL/min
Glucose, Bld: 109 mg/dL — ABNORMAL HIGH (ref 70–99)
Potassium: 4.3 meq/L (ref 3.5–5.1)
Sodium: 132 meq/L — ABNORMAL LOW (ref 135–145)

## 2012-05-08 LAB — BASIC METABOLIC PANEL
CO2: 20 mEq/L (ref 19–32)
Creatinine, Ser: 0.43 mg/dL — ABNORMAL LOW (ref 0.50–1.10)
GFR calc Af Amer: >90 mL/min (ref >90)
Glucose, Bld: 271 mg/dL — ABNORMAL HIGH (ref 70–99)
Potassium: 4 mEq/L (ref 3.5–5.1)
Sodium: 131 mEq/L — ABNORMAL LOW (ref 135–145)

## 2012-05-08 MED ORDER — INSULIN PEN STARTER KIT
1.0000 | Freq: Once | Status: AC
  Administered 2012-05-08: 1
  Filled 2012-05-08 (×2): qty 1

## 2012-05-08 MED ORDER — "PEN NEEDLES 1/2"" 29G X 12MM MISC": 1.0000 | Status: DC | PRN

## 2012-05-08 MED ORDER — BLOOD GLUCOSE TEST VI STRP: 1.0000 | ORAL_STRIP | Freq: Three times a day (TID) | Status: DC

## 2012-05-08 MED ORDER — INSULIN ASPART 100 UNIT/ML ~~LOC~~ SOLN: 2.0000 [IU] | Freq: Three times a day (TID) | SUBCUTANEOUS | Status: DC

## 2012-05-08 MED ORDER — INSULIN ASPART PROT & ASPART (70-30 MIX) 100 UNIT/ML ~~LOC~~ SUSP: 18.0000 [IU] | Freq: Two times a day (BID) | SUBCUTANEOUS | Status: DC

## 2012-05-08 MED ORDER — INSULIN PEN STARTER KIT: 1.0000 | Freq: Once | Status: DC

## 2012-05-08 NOTE — Progress Notes (Signed)
Resident Addendum to Medical Student Note   I have seen and examined the patient, and agree with the the medical student assessment and plan outlined in separate note. Please see my brief note below for additional details.  S: No acute events overnight, denies nausea, vomiting or abdominal pain.  Continues to have cough which is improving.   OBJECTIVE: VS: Reviewed  Meds: Reviewed  Labs: Reviewed  Imaging: Reviewed   Physical Exam: General: Well-developed, well-nourished, in no acute distress; sleeping in bed with  boyfriend at bedside Lungs: Normal respiratory effort.  Abdomen: Nondistended Extremities: No pretibial edema Neurologic: grossly non-focal, alert and oriented x3     ASSESSMENT/ PLAN: Pt is a 22 y.o. yo female with a PMHx of Type 1 Diabetes Mellitus who was admitted on 05/06/2012 with symptoms of nausea, vomiting, and abdominal pain, which was determined to be secondary to Diabetic Ketoacidosis.    DKA: resolved, AG 9 this morning, electrolytes adequately supplemented and currently within normal limits  CBG (last 3)   Basename 05/08/12 0724 05/08/12 0437 05/08/12 0215  GLUCAP 234* 112* 236*    Lab 05/08/12 0500 05/08/12 0027 05/07/12 2022 05/07/12 1621 05/07/12 1316  K 4.3 4.0 3.9 3.9 3.2*    DM Type 1: poorly controlled with HgbA1c >20, both inpatient and outpatient Diabetic Coordinator consulted, pt requesting to convert back to insulin pens  -will switch to Novolog 70/30 Flex Pens and continue at prior dosage 18 Units bid with meals  -instructed on sliding scale dosing    Disposition: pt with Medicaid, states that she can afford the co-pay for her medications  -discharge home today  -f/u [facility] 1/15/ 8:45am  Length of Stay: 2   Dorian Heckle, MD PGY2, Internal Medicine Resident 05/08/2012, 11:26 AM

## 2012-05-08 NOTE — Progress Notes (Signed)
Medical Student Daily Progress Note  Subjective: Patient complains of cough overnight.  Reports cough syrup only helped mildly.  Otherwise, reports doing well.  Feels overall better compared to time of admission and wants to go home.  Denies chest pain, SOB, N/V and fatigue.  Pt also discussed interests in switching to insulin pens.    Objective: Vital signs in last 24 hours: Filed Vitals:   05/07/12 2000 05/08/12 0000 05/08/12 0400 05/08/12 0721  BP: 110/72 114/79 105/60   Pulse: 105 103 98   Temp: 98.2 F (36.8 C) 98.8 F (37.1 C) 97.4 F (36.3 C) 98.1 F (36.7 C)  TempSrc: Oral Oral Oral Oral  Resp: 17 16 20    Height:      Weight:      SpO2: 100% 100% 100%    Weight change:   Intake/Output Summary (Last 24 hours) at 05/08/12 0756 Last data filed at 05/08/12 0600  Gross per 24 hour  Intake 5014.43 ml  Output   2100 ml  Net 2914.43 ml   Physical Exam: Vitals reviewed. General: resting in bed, NAD HEENT: PERRL, EOMI, no scleral icterus Cardiac: RRR, no rubs, murmurs or gallops Pulm: clear to auscultation bilaterally, no wheezes, rales, or rhonchi Abd: soft, nontender, nondistended, BS present Ext: warm and well perfused, no pedal edema Neuro: alert and oriented X3, cranial nerves II-XII grossly intact Lab Results:    01/08 2022  01/09 0027  01/09 0500   Glucose, Bld 70  271  132    Hemoglobin          HCT          Platelets          Sodium  133   131   132     Potassium  3.9   4.0   4.3     Chloride  100   98   103     CO2  22   20   20      BUN  6   5   4      Creatinine  0.50   0.43   0.41     Calcium  8.7   8.4   8.5          01/07 2021    01/08 0314          WBC  9.1   9.0         RBC  4.60   3.64         Hemoglobin  13.7   10.4         HCT  39.7   30.7         Platelets  293   232          Micro Results: Recent Results (from the past 240 hour(s))  MRSA PCR SCREENING     Status: Normal   Collection Time   05/07/12  3:29 AM      Component Value  Range Status Comment   MRSA by PCR NEGATIVE  NEGATIVE Final    Studies/Results: Dg Chest 2 View  05/06/2012  *RADIOLOGY REPORT*  Clinical Data: Chest pain, shortness of breath  CHEST - 2 VIEW  Comparison: 03/18/2012  Findings: Lungs are clear. No pleural effusion or pneumothorax.  Cardiomediastinal silhouette is within normal limits.  Visualized osseous structures are within normal limits.  IMPRESSION: Normal chest radiographs.   Original Report Authenticated By: Julian Hy, M.D.    Medications: I have reviewed the patient's current  medications. Scheduled Meds:   . enoxaparin (LOVENOX) injection  40 mg Subcutaneous Q24H  . insulin aspart  0-15 Units Subcutaneous TID WC  . insulin aspart  0-5 Units Subcutaneous QHS  . insulin aspart protamine-insulin aspart  18 Units Subcutaneous BID WC  . magnesium oxide  400 mg Oral BID   Continuous Infusions:   . sodium chloride 150 mL/hr at 05/07/12 0518  . dextrose 5 % and 0.45% NaCl 50 mL/hr at 05/08/12 0600   PRN Meds:.acetaminophen, acetaminophen, dextrose, guaiFENesin-dextromethorphan, morphine injection, ondansetron (ZOFRAN) IV, ondansetron, promethazine  Assessment/Plan:  Ashley Freeman is a 22 yo woman with poorly-controlled type I DM with four DKA admissions in 2013 who presented on 1/7 with flu-like symptoms, nausea, vomiting, elevated sugars in the 500's, found to have DKA  Type 1 DM with DKA: BG ranging from 70-289 over last 24 hours, gap now closed -insulin drip discontinued O/N, now receiving SQ insulin, qAC BG checks -[facility] DM Educator saw pt yesterday and reinforced compliance  -currently in SDU, will move to floor -given pt's interest in insulin pens, will discuss options with DM Educator and counsel pt accordingly  Viral Illness: likely precipitant of DKA -influenza PCR panel negative -URI symptoms improving, will continue to monitor  Hypokalemia: 2/2 DKA -resolved, will continue to monitor and replete prn  DVT PPX:  lovenox      LOS: 2 days   This is a Careers information officer Note.  The care of the patient was discussed with Dr. Michail Sermon and the assessment and plan formulated with their assistance.  Please see their attached note for official documentation of the daily encounter.  Loni Beckwith T 05/08/2012, 7:56 AM   Dispo: Anticipated discharge is today.  Pt's PCP is Dr. Aundra Dubin and will need [facility] follow-up.  Appointment scheduled with Dr. Sissy Hoff on 05/14/12 at 8:45 AM.      .Services Needed at time of discharge: Y = Yes, Blank = No PT:   OT:   RN:   Equipment:   Other:

## 2012-05-08 NOTE — Progress Notes (Addendum)
Pt to be TX to [id number], VSS, called report

## 2012-05-08 NOTE — Progress Notes (Signed)
Educated patient on insulin pen use at home.  Reviewed contents of insulin flexpen starter kit.  Reviewed all steps of insulin pen including attachment of needle, 2-unit air shot, dialing up dose, giving injection, removing needle, disposal of sharps, storage of unused insulin, disposal of insulin etc.  Patient able to provide successful return demonstration.  Also reviewed troubleshooting with insulin pen.  MD to give patient Rxs for insulin pens and insulin pen needles.  Patient has used insulin pens before.  Per patient, patient last used insulin pens several months ago.  Will follow. Wyn Quaker RN, MSN, CDE Diabetes Coordinator Inpatient Diabetes Program [phone]

## 2012-05-08 NOTE — Care Management Note (Signed)
    Page 1 of 1   05/08/2012     1:59:35 PM   CARE MANAGEMENT NOTE 05/08/2012  Patient:  Ashley Freeman, Ashley Freeman   Account Number:  [id number]  Date Initiated:  05/07/2012  Documentation initiated by:  Marvetta Gibbons  Subjective/Objective Assessment:   Pt admitted with DKA     Action/Plan:   PTA pt lived at home with parents- NCM to follow for d/c needs   Anticipated DC Date:  05/08/2012   Anticipated DC Plan:  [place]  CM consult      Choice offered to / List presented to:             Status of service:  Completed, signed off Medicare Important Message given?   (If response is "NO", the following Medicare IM given date fields will be blank) Date Medicare IM given:   Date Additional Medicare IM given:    Discharge Disposition:  HOME/SELF CARE  Per UR Regulation:  Reviewed for med. necessity/level of care/duration of stay  If discussed at [place] of Stay Meetings, dates discussed:    Comments:  05/08/12 13:56 Tomi Bamberger RN, BSN [phone] patient dc to home.

## 2012-05-08 NOTE — Progress Notes (Signed)
NURSING PROGRESS NOTE  Ashley Freeman [id number] Discharge Data: 05/08/2012 12:53 PM Attending Provider: Sid Falcon, MD [id number], Nino Glow, MD     [name] to be D/C'd Home per MD order.  Discussed with the patient the After Visit Summary and all questions fully answered. All IV's discontinued with no bleeding noted. All belongings returned to patient for patient to take home.   Last Vital Signs:  Blood pressure 111/74, pulse 109, temperature 98 F (36.7 C), temperature source Oral, resp. rate 18, height 5\' 4"  (1.626 m), weight 57.9 kg (127 lb 10.3 oz), last menstrual period 03/23/2012, SpO2 99.00%, not currently breastfeeding.  Discharge Medication List   Medication List     As of 05/08/2012 12:53 PM    TAKE these medications         Flexpen Starter Kit Misc   1 kit by Other route once.      insulin aspart 100 UNIT/ML injection   Commonly known as: novoLOG   Inject 2-10 Units into the skin 3 (three) times daily with meals. Sliding scale      insulin aspart protamine-insulin aspart (70-30) 100 UNIT/ML injection   Commonly known as: NOVOLOG 70/30   Inject 18 Units into the skin 2 (two) times daily with a meal.        [name], RN

## 2012-05-08 NOTE — Discharge Summary (Signed)
[facility] Discharge Note  Name: Ashley Freeman MRN: [id number] DOB: 08-17-1990 22 y.o.  Date of Admission: 05/06/2012  9:37 PM Date of Discharge: 05/08/2012 Attending Physician: Gilles Chiquito, MD  Discharge Diagnosis: Principal Problem:  *DKA (diabetic ketoacidoses) Active Problems:  Diabetes mellitus type 1 (uncontrolled)  Metabolic acidosis  Metabolic alkalosis  Hypokalemia  Hypomagnesemia   Discharge Medications:   Medication List     As of 05/08/2012  4:28 PM    TAKE these medications         BLOOD GLUCOSE TEST STRIPS Strp   1 each by In Vitro route 3 (three) times daily after meals.      Flexpen Starter Kit Misc   1 kit by Other route once.      insulin aspart 100 UNIT/ML injection   Commonly known as: novoLOG   Inject 2-10 Units into the skin 3 (three) times daily with meals. Sliding scale      insulin aspart protamine-insulin aspart (70-30) 100 UNIT/ML injection   Commonly known as: NOVOLOG 70/30   Inject 18 Units into the skin 2 (two) times daily with a meal.      PEN NEEDLES 29GX1/2" 29G X 12MM Misc   1 each by Does not apply route as needed.        Disposition and follow-up:   Ms.Ashley Freeman was discharged from [facility] in stable condition.  At the hospital follow up visit, please address insulin regimen compliance.   Follow-up Appointments:     Follow-up Information    Follow up with Santa Lighter, MD. On 05/14/2012. (8:45)    Contact information:   [facility] [address] [place] [address] [phone]         Discharge Orders    Future Appointments: Provider: Department: Dept Phone: Center:   05/14/2012 8:45 AM Neta Ehlers, MD [facility] [phone] [facility]     Future Orders Please Complete By Expires   Discharge instructions      Comments:   We have changed the insulin to the pens. You should continue to take 18  Units of the 70/30 insulin from the pen twice a day with breakfast and dinner. Check your blood sugar 30 minutes after each meal and take the sliding scale insulin as instructed. Keep your appointment with the clinic May 14, 2012 at 8:45am.  Sugar less than 70-120 --> No (zero) Units Sugar 121-150 --> 2 Units Sugar 151-200 --> 3 Units Sugar 201-250 --> 5 Units Sugar 251-300 --> 8 Units Sugar 301-350 --> 11 Units Sugar 351-400 --> 15 Units Sugar > 400--> call clinic at [phone]      Consultations:   Diabetic Coordinator  Procedures Performed:  Dg Chest 2 View  05/06/2012  *RADIOLOGY REPORT*  Clinical Data: Chest pain, shortness of breath  CHEST - 2 VIEW  Comparison: 03/18/2012  Findings: Lungs are clear. No pleural effusion or pneumothorax.  Cardiomediastinal silhouette is within normal limits.  Visualized osseous structures are within normal limits.  IMPRESSION: Normal chest radiographs.   Original Report Authenticated By: Julian Hy, M.D.     Admission HPI: This is a 22 year old woman with poorly controlled type 1 diabetes mellitus (HgbA1c > 20 in 01/2012) with 4 prior DKA admissions in 2013 (last 02/2012) and significant insulin noncompliance. She presented to [facility] ED for flulike symptoms, nausea, and vomiting. The last 2-3 days, she has been feeling sick with sore throat, dyspnea, fatigue,  weakness, myalgias, headaches, and chills. Then this morning, she began feeling nauseated and vomited several times. For the last 2-3 days, her sugars have been high; running in the 200s to 300s. She reports compliance with her insulin regimen, which includes 18 units of 70/30 next twice a day and 3-10 units of insulin aspart with meals. On review of systems, she denies dizziness and diarrhea.   Hospital Course by problem list:   1. Diabetic Ketoacidosis in Type I Diabetes Mellitus: Patient with poorly-controlled Type 1 DM (HbA1c > 20 on admission and in 01/2012) was admitted for DKA with anion gap  of 26, bicarb of 16 and CBG > 600.  Patient's DKA was likely triggered by an acute viral illness as patient reported flu-like symptoms and nausea/vomiting beginning 2-3 days prior to admission and endorsed compliance with her insulin regimen.  Patient was initially treated with IVF and Glucomander protocol in the Step Down Unit. Developed mild  hypokalemia and hypomagnesemia secondary to DKA and subsequent insulin infusion.  Potassium and magnesium was repleted with IV supplementation. Once the patient's gap closed, she was transitioned to subcutaneous insulin.  A diabetic educator was consulted in hospital and stressed the importance of compliance with her insulin regimen.  At that time, patient voiced interest in using insulin pens, with which she felt she would have more success.  On the day of discharge, her DKA had resolved, and she had no complaints other than a mild cough which had improved since time of admission.  Given patient's interest, she was switched to Novolog 70/30 Flex Pens and continued at her prior dosage of 18 units BID with SSI meal coverage.  Patient was educated on appropriate use of her Flex Pens and instructed on sliding scale dosing. She voiced understanding and agreement with her treatment plan.  She is to follow up in the Internal Medicine Clinic on 05/13/12 at 8:45 am.     2. Upper Respiratory Infection: likely viral, Patient initially presented with sore throat, fatigue, myalgias and chills concerning for viral illness specifically influenza.  She remained afebrile without leukocytosis and received supportive therapy with IV fluids. Influenza panel returned negative.  By day of discharge, the above symptoms had resolved with the exception of mild cough which had improved since time of admission.  Discharge Vitals:  BP 111/74  Pulse 109  Temp 98 F (36.7 C) (Oral)  Resp 18  Ht 5\' 4"  (1.626 m)  Wt 127 lb 10.3 oz (57.9 kg)  BMI 21.91 kg/m2  SpO2 99%  LMP 03/23/2012   Breastfeeding? No  Discharge Labs:  Results for orders placed during the hospital encounter of 05/06/12 (from the past 24 hour(s))  GLUCOSE, CAPILLARY     Status: Abnormal   Collection Time   05/07/12  4:52 PM      Component Value Range   Glucose-Capillary 113 (*) 70 - 99 mg/dL   Comment 1 Documented in Chart     Comment 2 Notify RN    GLUCOSE, CAPILLARY     Status: Abnormal   Collection Time   05/07/12  5:55 PM      Component Value Range   Glucose-Capillary 167 (*) 70 - 99 mg/dL   Comment 1 Notify RN     Comment 2 Documented in Chart    GLUCOSE, CAPILLARY     Status: Abnormal   Collection Time   05/07/12  6:59 PM      Component Value Range   Glucose-Capillary 204 (*) 70 - 99 mg/dL  Comment 1 Documented in Chart     Comment 2 Notify RN    GLUCOSE, CAPILLARY     Status: Abnormal   Collection Time   05/07/12  8:13 PM      Component Value Range   Glucose-Capillary 101 (*) 70 - 99 mg/dL   Comment 1 Notify RN    BASIC METABOLIC PANEL     Status: Abnormal   Collection Time   05/07/12  8:22 PM      Component Value Range   Sodium 133 (*) 135 - 145 mEq/L   Potassium 3.9  3.5 - 5.1 mEq/L   Chloride 100  96 - 112 mEq/L   CO2 22  19 - 32 mEq/L   Glucose, Bld 70  70 - 99 mg/dL   BUN 6  6 - 23 mg/dL   Creatinine, Ser 0.50  0.50 - 1.10 mg/dL   Calcium 8.7  8.4 - 10.5 mg/dL   GFR calc non Af Amer >90  >90 mL/min   GFR calc Af Amer >90  >90 mL/min  GLUCOSE, CAPILLARY     Status: Normal   Collection Time   05/07/12  9:17 PM      Component Value Range   Glucose-Capillary 98  70 - 99 mg/dL   Comment 1 Notify RN    GLUCOSE, CAPILLARY     Status: Abnormal   Collection Time   05/07/12 10:01 PM      Component Value Range   Glucose-Capillary 107 (*) 70 - 99 mg/dL   Comment 1 Notify RN    GLUCOSE, CAPILLARY     Status: Abnormal   Collection Time   05/07/12 11:01 PM      Component Value Range   Glucose-Capillary 131 (*) 70 - 99 mg/dL   Comment 1 Notify RN    GLUCOSE, CAPILLARY     Status:  Abnormal   Collection Time   05/08/12 12:03 AM      Component Value Range   Glucose-Capillary 211 (*) 70 - 99 mg/dL   Comment 1 Notify RN    BASIC METABOLIC PANEL     Status: Abnormal   Collection Time   05/08/12 12:27 AM      Component Value Range   Sodium 131 (*) 135 - 145 mEq/L   Potassium 4.0  3.5 - 5.1 mEq/L   Chloride 98  96 - 112 mEq/L   CO2 20  19 - 32 mEq/L   Glucose, Bld 271 (*) 70 - 99 mg/dL   BUN 5 (*) 6 - 23 mg/dL   Creatinine, Ser 0.43 (*) 0.50 - 1.10 mg/dL   Calcium 8.4  8.4 - 10.5 mg/dL   GFR calc non Af Amer >90  >90 mL/min   GFR calc Af Amer >90  >90 mL/min  GLUCOSE, CAPILLARY     Status: Abnormal   Collection Time   05/08/12  1:02 AM      Component Value Range   Glucose-Capillary 247 (*) 70 - 99 mg/dL   Comment 1 Notify RN    GLUCOSE, CAPILLARY     Status: Abnormal   Collection Time   05/08/12  2:15 AM      Component Value Range   Glucose-Capillary 236 (*) 70 - 99 mg/dL   Comment 1 Notify RN    GLUCOSE, CAPILLARY     Status: Abnormal   Collection Time   05/08/12  4:37 AM      Component Value Range   Glucose-Capillary 112 (*) 70 -  99 mg/dL   Comment 1 Notify RN    BASIC METABOLIC PANEL     Status: Abnormal   Collection Time   05/08/12  5:00 AM      Component Value Range   Sodium 132 (*) 135 - 145 mEq/L   Potassium 4.3  3.5 - 5.1 mEq/L   Chloride 103  96 - 112 mEq/L   CO2 20  19 - 32 mEq/L   Glucose, Bld 109 (*) 70 - 99 mg/dL   BUN 4 (*) 6 - 23 mg/dL   Creatinine, Ser 0.41 (*) 0.50 - 1.10 mg/dL   Calcium 8.5  8.4 - 10.5 mg/dL   GFR calc non Af Amer >90  >90 mL/min   GFR calc Af Amer >90  >90 mL/min  GLUCOSE, CAPILLARY     Status: Abnormal   Collection Time   05/08/12  7:24 AM      Component Value Range   Glucose-Capillary 234 (*) 70 - 99 mg/dL   Comment 1 Notify RN     Comment 2 Documented in Chart      Signed: Drexel Ivey 05/08/2012, 4:28 PM   Time Spent on Discharge: 35 min Services Ordered on Discharge: Outpt diabetic coordinator Equipment  Ordered on Discharge: None

## 2012-05-09 ENCOUNTER — Telehealth: Payer: Self-pay | Admitting: Dietician

## 2012-05-09 NOTE — Telephone Encounter (Signed)
Discharge date:05-08-12 Call date: 05-09-12- left message on 1st attempt to contact pt. Hospital follow up appointment date:   Calling to assist with transition of care from hospital to home.  Discharge medications reviewed:  Able to fill all prescriptions?  Patient aware of hospital follow up appointments.   No problems with transportation.  Other problems/concerns:

## 2012-05-10 NOTE — Progress Notes (Signed)
Resident Addendum to Medical Student Note   I have seen and examined the patient, and agree with the the medical student assessment and plan outlined above. Please see my separate brief note for additional details.   Dorian Heckle, MD

## 2012-05-14 ENCOUNTER — Ambulatory Visit: Payer: Medicaid Other | Admitting: Internal Medicine

## 2012-05-16 NOTE — Telephone Encounter (Signed)
Have left 3 messages with no return call. Note patient missed her hospital follow up

## 2012-08-04 ENCOUNTER — Inpatient Hospital Stay (HOSPITAL_COMMUNITY)
Admission: EM | Admit: 2012-08-04 | Discharge: 2012-08-06 | DRG: 638 | Disposition: A | Discharge status: Home / Self Care | Payer: Medicaid Other | Attending: Internal Medicine | Admitting: Internal Medicine

## 2012-08-04 ENCOUNTER — Encounter (HOSPITAL_COMMUNITY): Payer: Self-pay | Admitting: Emergency Medicine

## 2012-08-04 DIAGNOSIS — R7989 Other specified abnormal findings of blood chemistry: Secondary | ICD-10-CM | POA: Diagnosis present

## 2012-08-04 DIAGNOSIS — IMO0002 Reserved for concepts with insufficient information to code with codable children: Secondary | ICD-10-CM

## 2012-08-04 DIAGNOSIS — R000 Tachycardia, unspecified: Secondary | ICD-10-CM

## 2012-08-04 DIAGNOSIS — R739 Hyperglycemia, unspecified: Secondary | ICD-10-CM

## 2012-08-04 DIAGNOSIS — Z794 Long term (current) use of insulin: Secondary | ICD-10-CM

## 2012-08-04 DIAGNOSIS — E109 Type 1 diabetes mellitus without complications: Secondary | ICD-10-CM

## 2012-08-04 DIAGNOSIS — E1110 Type 2 diabetes mellitus with ketoacidosis without coma: Secondary | ICD-10-CM

## 2012-08-04 DIAGNOSIS — E1065 Type 1 diabetes mellitus with hyperglycemia: Secondary | ICD-10-CM

## 2012-08-04 DIAGNOSIS — E1010 Type 1 diabetes mellitus with ketoacidosis without coma: Principal | ICD-10-CM | POA: Diagnosis present

## 2012-08-04 DIAGNOSIS — Z9119 Patient's noncompliance with other medical treatment and regimen: Secondary | ICD-10-CM

## 2012-08-04 DIAGNOSIS — E872 Acidosis, unspecified: Secondary | ICD-10-CM

## 2012-08-04 DIAGNOSIS — E108 Type 1 diabetes mellitus with unspecified complications: Secondary | ICD-10-CM | POA: Diagnosis present

## 2012-08-04 DIAGNOSIS — E871 Hypo-osmolality and hyponatremia: Secondary | ICD-10-CM

## 2012-08-04 DIAGNOSIS — E876 Hypokalemia: Secondary | ICD-10-CM

## 2012-08-04 DIAGNOSIS — Z91199 Patient's noncompliance with other medical treatment and regimen due to unspecified reason: Secondary | ICD-10-CM

## 2012-08-04 LAB — CBC
HCT: 43 % (ref 36.0–46.0)
HCT: 38.8 % (ref 36.0–46.0)
Hemoglobin: 13.6 g/dL (ref 12.0–15.0)
MCHC: 35.1 g/dL (ref 30.0–36.0)
MCV: 91.3 fL (ref 78.0–100.0)
MCV: 84.3 fL (ref 78.0–100.0)
RBC: 4.71 MIL/uL (ref 3.87–5.11)
RDW: 13 % (ref 11.5–15.5)
WBC: 8.1 10*3/uL (ref 4.0–10.5)
WBC: 9.3 10*3/uL (ref 4.0–10.5)

## 2012-08-04 LAB — URINALYSIS, ROUTINE W REFLEX MICROSCOPIC
Bilirubin Urine: NEGATIVE
Glucose, UA: >1000 mg/dL — AB
Ketones, ur: >80 mg/dL — AB
Leukocytes, UA: NEGATIVE
Protein, ur: NEGATIVE mg/dL
pH: 5 (ref 5.0–8.0)

## 2012-08-04 LAB — URINE MICROSCOPIC-ADD ON

## 2012-08-04 LAB — BASIC METABOLIC PANEL
BUN: 19 mg/dL (ref 6–23)
CO2: 9 mEq/L — CL (ref 19–32)
CO2: 11 mEq/L — ABNORMAL LOW (ref 19–32)
Chloride: 73 mEq/L — ABNORMAL LOW (ref 96–112)
Chloride: 97 mEq/L (ref 96–112)
Creatinine, Ser: 0.59 mg/dL (ref 0.50–1.10)
Creatinine, Ser: 0.58 mg/dL (ref 0.50–1.10)
GFR calc Af Amer: >90 mL/min (ref >90)
GFR calc non Af Amer: >90 mL/min (ref >90)
GFR calc non Af Amer: >90 mL/min (ref >90)
Glucose, Bld: 935 mg/dL (ref 70–99)
Glucose, Bld: 258 mg/dL — ABNORMAL HIGH (ref 70–99)
Potassium: 3.8 mEq/L (ref 3.5–5.1)
Sodium: 119 mEq/L — CL (ref 135–145)
Sodium: 133 mEq/L — ABNORMAL LOW (ref 135–145)

## 2012-08-04 LAB — MRSA PCR SCREENING: MRSA by PCR: NEGATIVE

## 2012-08-04 LAB — GLUCOSE, CAPILLARY
Glucose-Capillary: >600 mg/dL (ref 70–99)
Glucose-Capillary: >600 mg/dL (ref 70–99)
Glucose-Capillary: 561 mg/dL (ref 70–99)
Glucose-Capillary: 317 mg/dL — ABNORMAL HIGH (ref 70–99)
Glucose-Capillary: 232 mg/dL — ABNORMAL HIGH (ref 70–99)

## 2012-08-04 MED ORDER — SODIUM CHLORIDE 0.9 % IV SOLN
INTRAVENOUS | Status: DC
  Administered 2012-08-04: 5.4 [IU]/h via INTRAVENOUS
  Administered 2012-08-04: 10 [IU]/h via INTRAVENOUS
  Filled 2012-08-04: qty 1

## 2012-08-04 MED ORDER — SODIUM CHLORIDE 0.9 % IV SOLN: INTRAVENOUS | Status: DC

## 2012-08-04 MED ORDER — DEXTROSE-NACL 5-0.45 % IV SOLN
INTRAVENOUS | Status: DC
  Administered 2012-08-04: 23:00:00 via INTRAVENOUS

## 2012-08-04 MED ORDER — DEXTROSE-NACL 5-0.45 % IV SOLN: INTRAVENOUS | Status: DC

## 2012-08-04 MED ORDER — ONDANSETRON HCL 4 MG/2ML IJ SOLN: 4.0000 mg | Freq: Three times a day (TID) | INTRAMUSCULAR | Status: DC | PRN

## 2012-08-04 MED ORDER — POTASSIUM CHLORIDE 10 MEQ/100ML IV SOLN: 10.0000 meq | INTRAVENOUS | Status: DC

## 2012-08-04 MED ORDER — SODIUM CHLORIDE 0.9 % IV SOLN
1000.0000 mL | Freq: Once | INTRAVENOUS | Status: AC
  Administered 2012-08-04: 1000 mL via INTRAVENOUS

## 2012-08-04 MED ORDER — INSULIN ASPART 100 UNIT/ML ~~LOC~~ SOLN: 0.0000 [IU] | Freq: Three times a day (TID) | SUBCUTANEOUS | Status: DC

## 2012-08-04 MED ORDER — HEPARIN SODIUM (PORCINE) 5000 UNIT/ML IJ SOLN
5000.0000 [IU] | Freq: Three times a day (TID) | INTRAMUSCULAR | Status: DC
  Filled 2012-08-04 (×7): qty 1

## 2012-08-04 MED ORDER — DEXTROSE 50 % IV SOLN: 25.0000 mL | INTRAVENOUS | Status: DC | PRN

## 2012-08-04 MED ORDER — SODIUM CHLORIDE 0.9 % IV SOLN
1000.0000 mL | INTRAVENOUS | Status: DC
  Administered 2012-08-04: 1000 mL via INTRAVENOUS

## 2012-08-04 MED ORDER — SODIUM CHLORIDE 0.9 % IV SOLN: 1000.0000 mL | INTRAVENOUS | Status: DC

## 2012-08-04 MED ORDER — INSULIN ASPART 100 UNIT/ML ~~LOC~~ SOLN
5.0000 [IU] | Freq: Once | SUBCUTANEOUS | Status: AC
  Administered 2012-08-04: 5 [IU] via INTRAVENOUS
  Filled 2012-08-04: qty 1

## 2012-08-04 NOTE — ED Notes (Signed)
Lab called with critical results: Glucose 935, potassium 4.8; CO2 9 sodium 119. Dr Venora Maples notified

## 2012-08-04 NOTE — ED Notes (Signed)
Pt reports dizziness, light headiness, urinary frequency, and increased thirst. Pt reports she hasn't checked her blood sugar in two days because she ran out of strips. Blood sugar check on arrival exceeds 600.

## 2012-08-04 NOTE — ED Provider Notes (Signed)
History     CSN: [id number]  Arrival date & time 08/04/12  1609   First MD Initiated Contact with Patient 08/04/12 1614      Chief Complaint  Patient presents with  . Hyperglycemia     The history is provided by the patient.   patient reports that she has not taken any insulin in 2 days and ran out of strips.  Today she checked her blood sugar is greater than 600.  She denies nausea and vomiting.  No fevers and chills.  No chest pain or abdominal pain.  No other complaints besides polyuria and polydipsia with some lightheadedness and dizziness.  Her heart rate on arrival was 102.  Her symptoms are moderate in severity.  Past Medical History  Diagnosis Date  . Gonorrhea 08/2011    Treated in 09/2011  . Diabetes mellitus 2001    Diagnosed at age 73 ; Type I    Past Surgical History  Procedure Laterality Date  . No past surgeries      Family History  Problem Relation Age of Onset  . Anesthesia problems Neg Hx   . Asthma Mother   . Gout Father   . Diabetes Paternal Grandmother     History  Substance Use Topics  . Smoking status: Never Smoker   . Smokeless tobacco: Never Used  . Alcohol Use: No    OB History   Grav Para Term Preterm Abortions TAB SAB Ect Mult Living   1 0   1  1   0      Review of Systems  All other systems reviewed and are negative.    Allergies  Penicillins  Home Medications   Current Outpatient Rx  Name  Route  Sig  Dispense  Refill  . insulin aspart (NOVOLOG) 100 UNIT/ML injection   Subcutaneous   Inject 2-10 Units into the skin 3 (three) times daily with meals. Sliding scale   1 pen   6   . insulin aspart protamine-insulin aspart (NOVOLOG 70/30) (70-30) 100 UNIT/ML injection   Subcutaneous   Inject 18 Units into the skin 2 (two) times daily with a meal.   3 mL   6   . Flexpen Starter Kit MISC   Other   1 kit by Other route once.   1 kit   0   . glucose blood (ACCU-CHEK AVIVA PLUS) test strip   Other   100 each by Other  route 4 (four) times daily.   100 strip   11   . Insulin Pen Needle (PEN NEEDLES 29GX1/2") 29G X 12MM MISC   Does not apply   1 each by Does not apply route as needed.   100 each   6     Please provide appropriate size for FlexPen     BP 123/79  Pulse 102  Temp(Src) 98 F (36.7 C) (Oral)  Resp 16  SpO2 100%  LMP 07/22/2012  Physical Exam  Nursing note and vitals reviewed. Constitutional: She is oriented to person, place, and time. She appears well-developed and well-nourished. No distress.  HENT:  Head: Normocephalic and atraumatic.  Eyes: EOM are normal.  Neck: Normal range of motion.  Cardiovascular: Normal rate, regular rhythm and normal heart sounds.   Pulmonary/Chest: Effort normal and breath sounds normal.  Abdominal: Soft. She exhibits no distension. There is no tenderness.  Musculoskeletal: Normal range of motion.  Neurological: She is alert and oriented to person, place, and time.  Skin: Skin is warm  and dry.  Psychiatric: She has a normal mood and affect. Judgment normal.    ED Course  Procedures (including critical care time)  Labs Reviewed  GLUCOSE, CAPILLARY - Abnormal; Notable for the following:    Glucose-Capillary >600 (*)    All other components within normal limits  BASIC METABOLIC PANEL - Abnormal; Notable for the following:    Sodium 119 (*)    Chloride 73 (*)    CO2 9 (*)    Glucose, Bld 935 (*)    All other components within normal limits  GLUCOSE, CAPILLARY - Abnormal; Notable for the following:    Glucose-Capillary 557 (*)    All other components within normal limits  CBC  PREGNANCY, URINE  URINALYSIS, ROUTINE W REFLEX MICROSCOPIC   No results found.   1. DKA (diabetic ketoacidoses)       MDM  Patient appears to be in diabetic ketoacidosis with anion gap of 37 and a bicarbonate of 9.  IV insulin drip now.  2 L fluid bolus.  Admit to hospital.        Hoy Morn, MD 08/04/12 [phone]

## 2012-08-04 NOTE — H&P (Signed)
Hospital Admission Note Date: 08/04/2012  Patient name: Ashley Freeman Medical record number: [id number] Date of birth: 03/12/1991 Age: 22 y.o. Gender: female PCP: Cresenciano Genre, MD  Medical Service: Internal Medicine   Attending physician: Dr. Cathren Laine     1st Contact: Dawna Part [phone] 2nd Contact: Dr. Doug Sou [phone] After 5 pm or weekends: 1st Contact: Pager: [phone] 2nd Contact: Pager: [phone]  Chief Complaint: Hyperglycemia, dizziness, shortness of breath  History of Present Illness: 22 y.o with uncontrolled type 1 DM she presented to [facility] ED with hyperglycemia (fsbs 935), dizziness, and shortness of breath with exertion and rest.  She has been noncompliant with insulin x 2 days.  She states she is taking 70/30 18 units bid but has missed doses due to being stressed out with school.  She also is out of glucose strips. She has been eating salad, spaggetti and drinking diet and regular drinks.  Symptoms that brought her into the ED include dizziness, sob with standing,  Walking, getting in and out of bed, which she states these symptoms happen and she knows she is in DKA.  Upon seeing the patient at [facility] these symptoms had resolved (i.e sob and dizziness).  She did state she has been having increased thirst, increased urination, though she denies polyphagia.    Meds: Medications Prior to Admission  Medication Sig Dispense Refill  . insulin aspart (NOVOLOG) 100 UNIT/ML injection Inject 2-10 Units into the skin 3 (three) times daily with meals. Sliding scale  1 pen  6  . insulin aspart protamine-insulin aspart (NOVOLOG 70/30) (70-30) 100 UNIT/ML injection Inject 18 Units into the skin 2 (two) times daily with a meal.  3 mL  6  . Flexpen Starter Kit MISC 1 kit by Other route once.  1 kit  0  . glucose blood (ACCU-CHEK AVIVA PLUS) test strip 100 each by Other route 4 (four) times daily.  100 strip  11  . Insulin Pen Needle (PEN NEEDLES 29GX1/2") 29G X 12MM MISC 1 each by  Does not apply route as needed.  100 each  6   Allergies: Allergies as of 08/04/2012 - Review Complete 08/04/2012  Allergen Reaction Noted  . Penicillins Hives 11/11/2010   Past Medical History  Diagnosis Date  . Gonorrhea 08/2011    Treated in 09/2011  . Diabetes mellitus 2001    Diagnosed at age 23 ; Type I   Past Surgical History  Procedure Laterality Date  . No past surgeries     Family History  Problem Relation Age of Onset  . Anesthesia problems Neg Hx   . Asthma Mother   . Gout Father   . Diabetes Paternal Grandmother    History   Social History  . Marital Status: Single    Spouse Name: N/A    Number of Children: 0  . Years of Education: 11th grade   Occupational History  . unemployed     has never worked   Social History Main Topics  . Smoking status: Never Smoker   . Smokeless tobacco: Never Used  . Alcohol Use: No  . Drug Use: No  . Sexually Active: Yes    Birth Control/ Protection: None     Comment: As of 11/06/11 1 partner (female) not using protection   Other Topics Concern  . Not on file   Social History Narrative   Patient lives in [place] mother lives in [place].  Unemployed.  Previously worked for a IT consultant.  Completed 11 grade  working on [employer]. Patient 3 brothers     Review of Systems: General: denies fever/chills, +decreased appetite, denies sick contacts  HEENT: denies sore throat or dental problems CV: denies chest pain Lungs: resolved sob at rest and with exertion, denies cough Abdomen/GU: denies abdominal pain, nausea/vomiting, denies dysuria, denies blood in urine or stool  Extremities: denies lower extremity sweeling  Neuro: resolved dizziness  Endocrine: increased thirst, increased urination, denies polyphagia   Physical Exam: HR 107, RA 100%, RR 17 Blood pressure 114/75, pulse 108, temperature 98.2 F (36.8 C), temperature source Oral, resp. rate 15, height 5\' 4"  (1.626 m), weight 123 lb 7.3 oz (56 kg), last  menstrual period 07/22/2012, SpO2 100.00%. General: lying in bed, nad, alert and oriented x 3  HEENT: [place]/at, perrl b/l  CV: slightly tachycardic, no murmurs  Lungs: ctab Abdomen: obese, normal bs, ntnd Extremities: warm, no cyanosis or edema, 2+ pulses b/l  Neuro: alert and oriented x 3, moving all 4 extremities   Lab results: Basic Metabolic Panel:  Recent Labs  08/04/12 1640 08/04/12 2212  NA 119* 133*  K 4.8 3.8  CL 73* 97  CO2 9* 11*  GLUCOSE 935* 258*  BUN 19 12  CREATININE 0.59 0.58  CALCIUM 10.1 9.3   Liver Function Tests: No results found for this basename: AST, ALT, ALKPHOS, BILITOT, PROT, ALBUMIN,  in the last 72 hours No results found for this basename: LIPASE, AMYLASE,  in the last 72 hours No results found for this basename: AMMONIA,  in the last 72 hours CBC:  Recent Labs  08/04/12 1640 08/04/12 2212  WBC 8.1 9.3  HGB 14.0 13.6  HCT 43.0 38.8  MCV 91.3 84.3  PLT 350 358   CBG:  Recent Labs  08/04/12 1623 08/04/12 1843 08/04/12 1949 08/04/12 2040 08/04/12 2144 08/04/12 2243  GLUCAP >600* 557* >600* 561* 317* 232*    Urine Drug Screen: Drugs of Abuse     Component Value Date/Time   LABOPIA POSITIVE* 02/18/2012 1732   LABOPIA NEGATIVE 12/18/2009 1453   COCAINSCRNUR NONE DETECTED 02/18/2012 1732   COCAINSCRNUR NEGATIVE 12/18/2009 1453   LABBENZ NONE DETECTED 02/18/2012 1732   LABBENZ NEGATIVE 12/18/2009 1453   AMPHETMU NONE DETECTED 02/18/2012 1732   AMPHETMU NEGATIVE 12/18/2009 1453   THCU NONE DETECTED 02/18/2012 1732   LABBARB NONE DETECTED 02/18/2012 1732    Urinalysis:  Recent Labs  08/04/12 1805  COLORURINE YELLOW  LABSPEC 1.032*  PHURINE 5.0  GLUCOSEU >1000*  HGBUR NEGATIVE  BILIRUBINUR NEGATIVE  KETONESUR >80*  PROTEINUR NEGATIVE  UROBILINOGEN 0.2  NITRITE NEGATIVE  LEUKOCYTESUR NEGATIVE   Misc. Labs: Magnesium LFTs  Imaging results: none  Other results: EKG: none   Assessment & Plan by Problem: 22 y.o with  multiple admissions for DKA due to uncontrolled DM 1 due to noncompliance with medications.   1. DKA with poorly controlled type 1 diabetes due to medical noncompliance (HA1C >20.0% in 04/2012)  -Glucose 935.  AG 37-->25 Bicarbonate 9-->11.  UA with 1.032 SG, >1000 glucose, >80 ketones -Insulin drip started in the ED at [facility].  Given 2 L IVF  -Will continue insulin gtt with protocol.  Will transition to sq insulin when AG resolves  -BMET q2 -SSI. Monitor cbg  -Consult to DM education and coordinator -Will admit to SDU  2. Sinus tachycardia (HR 102 on presentation to [facility]) -Likely due to volume depletion with polyuria due to hyperglycemia  -Monitor VS   3. F/E/N -D51/2 NS at 125 cc/hr  -  Will monitor and replace electrolytes  -Hyponatremia improving likely pseudo due to hyperglycemia -NPO  4. DVT prophylaxis  -Heparin sq   Dispo: Disposition is deferred at this time, awaiting improvement of current medical problems. Anticipated discharge in approximately 2-3 day(s).   The patient does have a current PCP Aundra Dubin, Nino Glow, MD), therefore will be requiring OPC follow-up after discharge.   The patient does have transportation limitations that hinder transportation to clinic appointments.  SignedCresenciano Genre [id number] 08/04/2012, 11:43 PM

## 2012-08-05 LAB — BASIC METABOLIC PANEL
BUN: 10 mg/dL (ref 6–23)
BUN: 10 mg/dL (ref 6–23)
BUN: 10 mg/dL (ref 6–23)
BUN: 8 mg/dL (ref 6–23)
CO2: 19 mEq/L (ref 19–32)
CO2: 18 mEq/L — ABNORMAL LOW (ref 19–32)
CO2: 21 mEq/L (ref 19–32)
Chloride: 99 mEq/L (ref 96–112)
Chloride: 101 mEq/L (ref 96–112)
Chloride: 102 mEq/L (ref 96–112)
Chloride: 103 mEq/L (ref 96–112)
Creatinine, Ser: 0.53 mg/dL (ref 0.50–1.10)
Creatinine, Ser: 0.5 mg/dL (ref 0.50–1.10)
GFR calc Af Amer: >90 mL/min (ref >90)
GFR calc Af Amer: >90 mL/min (ref >90)
GFR calc non Af Amer: >90 mL/min (ref >90)
GFR calc non Af Amer: >90 mL/min (ref >90)
GFR calc non Af Amer: >90 mL/min (ref >90)
Glucose, Bld: 209 mg/dL — ABNORMAL HIGH (ref 70–99)
Glucose, Bld: 162 mg/dL — ABNORMAL HIGH (ref 70–99)
Potassium: 3.6 mEq/L (ref 3.5–5.1)
Potassium: 3.7 mEq/L (ref 3.5–5.1)
Potassium: 3.5 mEq/L (ref 3.5–5.1)
Potassium: 3.7 mEq/L (ref 3.5–5.1)
Potassium: 4.1 mEq/L (ref 3.5–5.1)
Sodium: 136 mEq/L (ref 135–145)
Sodium: 136 mEq/L (ref 135–145)
Sodium: 130 mEq/L — ABNORMAL LOW (ref 135–145)

## 2012-08-05 LAB — HEPATIC FUNCTION PANEL
AST: 35 U/L (ref 0–37)
Albumin: 3 g/dL — ABNORMAL LOW (ref 3.5–5.2)
Alkaline Phosphatase: 83 U/L (ref 39–117)
Bilirubin, Direct: <0.1 mg/dL (ref 0.0–0.3)
Total Bilirubin: 0.2 mg/dL — ABNORMAL LOW (ref 0.3–1.2)

## 2012-08-05 LAB — GLUCOSE, CAPILLARY
Glucose-Capillary: 137 mg/dL — ABNORMAL HIGH (ref 70–99)
Glucose-Capillary: 162 mg/dL — ABNORMAL HIGH (ref 70–99)
Glucose-Capillary: 105 mg/dL — ABNORMAL HIGH (ref 70–99)
Glucose-Capillary: 194 mg/dL — ABNORMAL HIGH (ref 70–99)
Glucose-Capillary: 154 mg/dL — ABNORMAL HIGH (ref 70–99)
Glucose-Capillary: 121 mg/dL — ABNORMAL HIGH (ref 70–99)
Glucose-Capillary: 167 mg/dL — ABNORMAL HIGH (ref 70–99)
Glucose-Capillary: 303 mg/dL — ABNORMAL HIGH (ref 70–99)

## 2012-08-05 LAB — MAGNESIUM: Magnesium: 1.8 mg/dL (ref 1.5–2.5)

## 2012-08-05 MED ORDER — INSULIN ASPART 100 UNIT/ML ~~LOC~~ SOLN: 0.0000 [IU] | SUBCUTANEOUS | Status: DC | PRN

## 2012-08-05 MED ORDER — INSULIN ASPART PROT & ASPART (70-30 MIX) 100 UNIT/ML ~~LOC~~ SUSP
18.0000 [IU] | Freq: Two times a day (BID) | SUBCUTANEOUS | Status: DC
  Filled 2012-08-05: qty 10

## 2012-08-05 MED ORDER — INSULIN ASPART 100 UNIT/ML ~~LOC~~ SOLN
0.0000 [IU] | Freq: Three times a day (TID) | SUBCUTANEOUS | Status: AC
  Administered 2012-08-05: 3 [IU] via SUBCUTANEOUS

## 2012-08-05 MED ORDER — INSULIN ASPART 100 UNIT/ML ~~LOC~~ SOLN
0.0000 [IU] | SUBCUTANEOUS | Status: DC | PRN
  Administered 2012-08-06: 3 [IU] via SUBCUTANEOUS

## 2012-08-05 MED ORDER — POTASSIUM CHLORIDE 10 MEQ/100ML IV SOLN
10.0000 meq | INTRAVENOUS | Status: DC
  Administered 2012-08-05 (×2): 10 meq via INTRAVENOUS
  Filled 2012-08-05: qty 300

## 2012-08-05 MED ORDER — INSULIN ASPART 100 UNIT/ML ~~LOC~~ SOLN
0.0000 [IU] | Freq: Three times a day (TID) | SUBCUTANEOUS | Status: DC
  Filled 2012-08-05: qty 0.09

## 2012-08-05 MED ORDER — INSULIN ASPART PROT & ASPART (70-30 MIX) 100 UNIT/ML ~~LOC~~ SUSP: 18.0000 [IU] | Freq: Two times a day (BID) | SUBCUTANEOUS | Status: DC

## 2012-08-05 MED ORDER — INSULIN ASPART PROT & ASPART (70-30 MIX) 100 UNIT/ML ~~LOC~~ SUSP
18.0000 [IU] | Freq: Two times a day (BID) | SUBCUTANEOUS | Status: DC
  Administered 2012-08-06: 18 [IU] via SUBCUTANEOUS
  Filled 2012-08-05: qty 10

## 2012-08-05 MED ORDER — INSULIN GLARGINE 100 UNIT/ML ~~LOC~~ SOLN
24.0000 [IU] | Freq: Once | SUBCUTANEOUS | Status: AC
  Administered 2012-08-05: 24 [IU] via SUBCUTANEOUS
  Filled 2012-08-05: qty 0.24

## 2012-08-05 MED ORDER — INSULIN ASPART PROT & ASPART (70-30 MIX) 100 UNIT/ML ~~LOC~~ SUSP
9.0000 [IU] | Freq: Every day | SUBCUTANEOUS | Status: AC
  Administered 2012-08-05: 9 [IU] via SUBCUTANEOUS
  Filled 2012-08-05: qty 10

## 2012-08-05 NOTE — Progress Notes (Signed)
Ashley Freeman [id number] Code Status: full  Admission Data: 08/05/2012 6:24 PM Attending Provider:  garg [id number], Ashley Glow, MD Consults/ Treatment Team:    Ashley Freeman is a 22 y.o. female patient admitted from ED awake, alert - oriented  X 3 - no acute distress noted.  VSS - Blood pressure 108/72, pulse 99, temperature 98.2 F (36.8 C), temperature source Oral, resp. rate 18, height 5\' 4"  (1.626 m), weight 58.423 kg (128 lb 12.8 oz), last menstrual period 07/22/2012, SpO2 97.00%.  no c/o shortness of breath, no c/o chest pain. IV Fluids:  IV in place, occlusive dsg intact without redness, IV cath hand right, condition patent and no redness none.  Allergies:   Allergies  Allergen Reactions  . Penicillins Hives     Past Medical History  Diagnosis Date  . Gonorrhea 08/2011    Treated in 09/2011  . Diabetes mellitus 2001    Diagnosed at age 68 ; Type I   Medications Prior to Admission  Medication Sig Dispense Refill  . insulin aspart (NOVOLOG) 100 UNIT/ML injection Inject 2-10 Units into the skin 3 (three) times daily with meals. Sliding scale  1 pen  6  . insulin aspart protamine-insulin aspart (NOVOLOG 70/30) (70-30) 100 UNIT/ML injection Inject 18 Units into the skin 2 (two) times daily with a meal.  3 mL  6  . Flexpen Starter Kit MISC 1 kit by Other route once.  1 kit  0  . glucose blood (ACCU-CHEK AVIVA PLUS) test strip 100 each by Other route 4 (four) times daily.  100 strip  11  . Insulin Pen Needle (PEN NEEDLES 29GX1/2") 29G X 12MM MISC 1 each by Does not apply route as needed.  100 each  6   History:  obtained from the patient. Tobacco/alcohol: denied social drinker  Orientation to room, and floor completed with information packet given to patient/family.  Patient declined safety video at this time.  Admission INP armband ID verified with patient/family, and in place.   SR up x 2, fall assessment complete, with patient and family able to verbalize understanding  of risk associated with falls, and verbalized understanding to call nsg before up out of bed.  Call light within reach, patient able to voice, and demonstrate understanding.  Skin, clean-dry- intact without evidence of bruising, or skin tears.   No evidence of skin break down noted on exam.     Will cont to eval and treat per MD orders.  Gracy Bruins, RN 08/05/2012 6:24 PM

## 2012-08-05 NOTE — Progress Notes (Signed)
Resident Co-sign Daily Note: I have seen the patient and reviewed the daily progress note by Dawna Part MS 4 and discussed the care of the patient with them.  See below for documentation of my findings, assessment, and plans.  Subjective: The patient is feeling good this morning. She is hungry and would like to eat. She is not having belly pain and is not feeling dizzy or weak. She is not nauseous. She has no complaints. She is stating that her insulin is still at home but her stress level is high and she is trying to get her GED. She is not interested in discussing how to improve her care long term. She has a flat affect and is not moved by talk of possible complications or risks of uncontrolled diabetes long term.   Objective: Vital signs in last 24 hours: Filed Vitals:   08/05/12 0400 08/05/12 0410 08/05/12 0736 08/05/12 1248  BP:  106/61 98/51 116/70  Pulse:  85    Temp: 98.4 F (36.9 C)  97.7 F (36.5 C) 97.8 F (36.6 C)  TempSrc: Oral  Oral Oral  Resp:  17    Height:      Weight:      SpO2:  99%     Physical Exam: General: resting in bed, flat affect HEENT: PERRL, EOMI, no scleral icterus Cardiac: RRR, no rubs, murmurs or gallops Pulm: clear to auscultation bilaterally, moving normal volumes of air Abd: soft, nontender, nondistended, BS present Ext: warm and well perfused, no pedal edema Neuro: alert and oriented X3, cranial nerves II-XII grossly intact  Lab Results: Reviewed and documented in Electronic Record Micro Results: Reviewed and documented in Electronic Record Studies/Results: Reviewed and documented in Electronic Record Medications: I have reviewed the patient's current medications. Scheduled Meds: . heparin  5,000 Units Subcutaneous Q8H  . [START ON 08/06/2012] insulin aspart protamine-insulin aspart  18 Units Subcutaneous BID WC  . insulin aspart protamine-insulin aspart  9 Units Subcutaneous Q supper   Continuous Infusions: . dextrose 5 % and 0.45% NaCl 125  mL/hr at 08/04/12 2249  . insulin (NOVOLIN-R) infusion 1.4 Units/hr (08/05/12 0700)   PRN Meds:.dextrose Assessment/Plan:    DKA (diabetic ketoacidoses) - Resolving, AG 12 and closed this morning. Will transition to [facility] long acting with lantus 24 units this morning and half dose of her regimen tonight. She will get 9 units of novolog 70/30 tonight and resume 18 units BID tomorrow morning. Will follow BMP tonight to check on AG and again in the morning. Will offer diabetic education and counseling. See below for details.  -[facility] insulin as above -Transfer to med-surg bed -counseling and support    Diabetes mellitus type 1 (uncontrolled) - Getting HgA1c and diabetic educator. Tried to communicate with her about possible sources of improvement of her control and she was uninterested. Will continue to offer support and guidance. She is aware of her regimen and is not faithful about taking it.  Flat affect - Likely she has some underlying depression and may benefit from SSRI therapy in terms of her compliance with her DM I regimen. Will discuss with the patient if she is interested in therapy to help her mood.     Hyponatremia - Was pseudo due to high sugars and has resolved.  DVT ppx - heparin 5000 units [place] q 8 h   LOS: 1 day   Vertell Novak 08/05/2012, 2:51 PM

## 2012-08-05 NOTE — Progress Notes (Signed)
Pt had an elevated cbg of 344. Paged MD on call [place]. Pt to get sliding scale novolog ordered.

## 2012-08-05 NOTE — Progress Notes (Signed)
Inpatient Diabetes Program Recommendations  AACE/ADA: New Consensus Statement on Inpatient Glycemic Control (2013)  Target Ranges:  Prepandial:   less than 140 mg/dL      Peak postprandial:   less than 180 mg/dL (1-2 hours)      Critically ill patients:  140 - 180 mg/dL   Reason for Visit: Spoke to patient regarding diabetes management.   Her affect was flat.   She said she had insulin at home but was stressed with school so she did not take it.  She has had diabetes since age 22.  I asked who cared for her diabetes at age 10 and she said "I did".  I asked if she felt overwhelmed by her diabetes and she answered "yes".  She admits to rarely monitoring and when she does the meter reads "Hi".  She is currently out of strips.  She uses a 70/30 insulin pen at home (and she prefers the pen).  A1C was greater than 20.0 on her previous admission.    I asked her what motivated her to care for herself and she answered "nothing really".  Attempted to offer support to patient and asked how we could support her in caring for herself.  She was fairly quiet and did not acknowledge the long term consequences of not caring for herself.  Discussed patient with MD and med student.  Recommend "Depression screening" due to patients flat affect and recent 4 admissions in the past 6 months. She likely will require re-education regarding her diagnosis, long-term complications, and the day to day management of this disease as an outpatient.   Will see patient on 08/06/12.

## 2012-08-05 NOTE — Progress Notes (Signed)
Medical Student Daily Progress Note  Subjective: Patient is doing better this morning. She feels hungry and denies dizziness, shortness of breath, and abdominal pain. Patient was not receptive to diabetic counseling this morning.   Objective: Vital signs in last 24 hours: Filed Vitals:   08/04/12 2308 08/05/12 0400 08/05/12 0410 08/05/12 0736  BP: 114/75  106/61 98/51  Pulse: 108  85   Temp: 98.2 F (36.8 C) 98.4 F (36.9 C)  97.7 F (36.5 C)  TempSrc: Oral Oral  Oral  Resp: 15  17   Height:      Weight:      SpO2: 100%  99%    Weight change:   Intake/Output Summary (Last 24 hours) at 08/05/12 1021 Last data filed at 08/05/12 0737  Gross per 24 hour  Intake 1511.75 ml  Output      0 ml  Net 1511.75 ml   Physical Exam: General: alert, quiet but cooperative, in no apparent distress Lungs: clear to ascultation bilaterally, normal work of respiration, no wheezes, rales, ronchi Heart: tachycardic, regular rate and rhythm, no murmurs, gallops, or rubs Abdomen: soft, non-tender, non-distended Extremities: no cyanosis, clubbing, or edema Neurologic: alert & oriented X3, cranial nerves grossly II-XII intact  Lab Results: Basic Metabolic Panel:  Recent Labs Lab 08/05/12 0152 08/05/12 0455  NA 135 134*  K 3.7 3.5  CL 101 102  CO2 19 18*  GLUCOSE 162* 139*  BUN 10 10  CREATININE 0.56 0.50  CALCIUM 9.3 9.1  MG  --  1.8   Liver Function Tests:  Recent Labs Lab 08/05/12 0455  AST 35  ALT 26  ALKPHOS 83  BILITOT 0.2*  PROT 6.8  ALBUMIN 3.0*   No results found for this basename: LIPASE, AMYLASE,  in the last 168 hours No results found for this basename: AMMONIA,  in the last 168 hours CBC:  Recent Labs Lab 08/04/12 1640 08/04/12 2212  WBC 8.1 9.3  HGB 14.0 13.6  HCT 43.0 38.8  MCV 91.3 84.3  PLT 350 358   CBG:  Recent Labs Lab 08/05/12 0346 08/05/12 0455 08/05/12 0559 08/05/12 0656 08/05/12 0803 08/05/12 0904  GLUCAP 162* 139* 105* 194* 247*  154*   Hemoglobin A1C: 05/07/2012: >20.0 02/18/2013: >20.0 10/14/2011: 15.7  Urine Drug Screen: Drugs of Abuse     Component Value Date/Time   LABOPIA POSITIVE* 02/18/2012 1732   LABOPIA NEGATIVE 12/18/2009 [place] DETECTED 02/18/2012 [place] 12/18/2009 1453   LABBENZ NONE DETECTED 02/18/2012 1732   LABBENZ NEGATIVE 12/18/2009 1453   AMPHETMU NONE DETECTED 02/18/2012 1732   AMPHETMU NEGATIVE 12/18/2009 1453   THCU NONE DETECTED 02/18/2012 1732   LABBARB NONE DETECTED 02/18/2012 1732    Alcohol Level: No results found for this basename: ETH,  in the last 168 hours  Urinalysis:  Recent Labs Lab 08/04/12 1805  COLORURINE YELLOW  LABSPEC 1.032*  PHURINE 5.0  GLUCOSEU >1000*  HGBUR NEGATIVE  BILIRUBINUR NEGATIVE  KETONESUR >80*  PROTEINUR NEGATIVE  UROBILINOGEN 0.2  NITRITE NEGATIVE  LEUKOCYTESUR NEGATIVE   Micro Results: Recent Results (from the past 240 hour(s))  MRSA PCR SCREENING     Status: None   Collection Time    08/04/12  9:21 PM      Result Value Range Status   MRSA by PCR NEGATIVE  NEGATIVE Final   Comment:            The GeneXpert MRSA Assay (FDA     approved  for NASAL specimens     only), is one component of a     comprehensive MRSA colonization     surveillance program. It is not     intended to diagnose MRSA     infection nor to guide or     monitor treatment for     MRSA infections.   Studies/Results: No results found.  Medications: Medication have been reviewed. Scheduled Meds: . heparin  5,000 Units Subcutaneous Q8H   Continuous Infusions: . dextrose 5 % and 0.45% NaCl 125 mL/hr at 08/04/12 2249  . insulin (NOVOLIN-R) infusion 1.4 Units/hr (08/05/12 0700)   PRN Meds:.dextrose  Assessment/Plan: Patient is a 22 yo with multiple admissions (4 in past 6 months) for DKA due to uncontrolled DM 1 due to noncompliance with medications.   1. DKA with poorly controlled DM type 1 due to medical  noncompliance She has been noncompliant with insulin x 2 days, likely leading to this DKA. She states she is taking 70/30 18 units bid at home but has missed doses due to being stressed out with school. Last HA1c >20.0% in 04/2012. -Glucose 935. AG 37-->25-->14 -->12. UA with 1.032 SG, >1000 glucose, >80 ketones  -Lantus 24u SQ with SSI at lunch only -Novalog 18u at dinner only -AG resolved, d/c insulin drip 2hrs after lantus administered -Monitor cbg -BMETs pm & am -Order HgbA1C -Consult to DM education and coordinator    2. Sinus tachycardia, resolved likely due to volume depletion with polyuria due to hyperglycemia  -Monitor VS   3. F/E/N  -d/c IVF as patient is taking po -Will monitor and replace electrolytes, K stable  -Hyponatremia, resolved likely pseudo due to hyperglycemia  -Normal diet  4. DVT prophylaxis  -Heparin sq   Dispo: Disposition is deferred at this time, awaiting improvement of current medical problems. Anticipated discharge in approximately 2-3 day(s).   The patient does have a current PCP Aundra Dubin, Nino Glow, MD), therefore will be requiring OPC follow-up after discharge. Needs follow-up with ophthalmology for diabetic eye exam.  The patient does have transportation limitations that hinder transportation to clinic appointments.   LOS: 1 day   This is a Careers information officer Note.  The care of the patient was discussed with Dr. Doug Sou and the assessment and plan formulated with their assistance.  Please see their attached note for official documentation of the daily encounter.  Dawna Part 08/05/2012, 10:21 AM

## 2012-08-05 NOTE — H&P (Signed)
Internal Medicine Teaching Service Attending Note Date: 08/05/2012  Patient name: Ashley Freeman  Medical record number: [id number]  Date of birth: [birth year]    This patient has been seen and discussed with the house staff. Please see their note for complete details. I concur with their findings with the following additions/corrections:  History of present illness: Patient is a 22 year old female with past medical history most significant for uncontrolled type 1 diabetes who presented to [facility] long ER with hyperglycemia, dizziness and shortness of breath. Patient has been noncompliant with her insulin for past 2 days because of being stressed out at school. She also has been overeating and drinking a lot of sodas. Patient began to feel dizziness which was worse with walking. Patient usually has similar symptoms when she is in DKA and ended up coming to the hospital. The patient has been having increased urination, polyphagia and increased thirst.  Patient denies any chest pain, shortness of breath, cough, abdominal pain, change in the color of urine.  Past medical history, past surgical history, medications, family history and social history was reviewed and is as per resident's note.  BP 116/70  Pulse 85  Temp(Src) 97.8 F (36.6 C) (Oral)  Resp 17  Ht 5\' 4"  (1.626 m)  Wt 123 lb 7.3 oz (56 kg)  BMI 21.18 kg/m2  SpO2 99%  LMP 07/22/2012 Physical Exam: General: Vital signs reviewed and noted. Well-developed, well-nourished, in no acute distress; alert, appropriate and cooperative throughout examination.  Head: Normocephalic, atraumatic.  Eyes: PERRL, EOMI, No signs of anemia or jaundince.  Nose: Mucous membranes moist, not inflammed, nonerythematous.  Throat: Oropharynx nonerythematous, no exudate appreciated.   Neck: No deformities, masses, or tenderness noted.Supple, No carotid Bruits, no JVD.  Lungs:  Normal respiratory effort. Clear to auscultation BL without crackles or  wheezes.  Heart: RRR. S1 and S2 normal without gallop, murmur, or rubs.  Abdomen:  BS normoactive. Soft, Nondistended, non-tender.  No masses or organomegaly.  Extremities: No pretibial edema.  Neurologic: A&O X3, CN II - XII are grossly intact. Motor strength is 5/5 in the all 4 extremities, Sensations intact to light touch, Cerebellar signs negative.  Skin: No visible rashes, scars.   Labs and imaging studies were reviewed.  Remarkable for an initial anion gap of 37. Anion gap at this time is 12.  Assessment and plan: Patient is a 22 year old female with past medical history most significant for uncontrolled type 1 diabetes currently being admitted for DKA most likely secondary to medication noncompliance. Patient's anion gap is already close to 12. Patient has no nausea or vomiting at this time. We will inject 10 units of long-acting insulin and discontinue IV insulin after one hour. I also believe that patient is ready to eat at this time. Decreased the frequency of basic metabolic profile to every 6 hours. Continue IV fluids at 125 cc an hour normal saline. Continue to check CBG every 2 hours. Follow up in the morning. I spent about 20 minutes in counseling the patient and her friend about the importance of not missing any doses of long-acting insulin. Diabetes education regarding the importance of insulin.  Janell Quiet MD Faculty-Internal Medicine Residency Program   [place], Thelma Comp 08/05/2012, 2:32 PM

## 2012-08-05 NOTE — Progress Notes (Signed)
Utilization review completed.  

## 2012-08-05 NOTE — Progress Notes (Signed)
Paged MD for order-clarification regarding IV potassium order. Per MD on-call, K can be help until it drops below 3.5. Will continue to monitor and assess.

## 2012-08-06 LAB — GLUCOSE, CAPILLARY
Glucose-Capillary: 177 mg/dL — ABNORMAL HIGH (ref 70–99)
Glucose-Capillary: 202 mg/dL — ABNORMAL HIGH (ref 70–99)
Glucose-Capillary: 128 mg/dL — ABNORMAL HIGH (ref 70–99)

## 2012-08-06 LAB — BASIC METABOLIC PANEL
Calcium: 9 mg/dL (ref 8.4–10.5)
Creatinine, Ser: 0.47 mg/dL — ABNORMAL LOW (ref 0.50–1.10)
GFR calc Af Amer: >90 mL/min (ref >90)
GFR calc non Af Amer: >90 mL/min (ref >90)
Glucose, Bld: 184 mg/dL — ABNORMAL HIGH (ref 70–99)
Sodium: 135 mEq/L (ref 135–145)

## 2012-08-06 LAB — HEMOGLOBIN A1C: Mean Plasma Glucose: 458 mg/dL — ABNORMAL HIGH (ref <117)

## 2012-08-06 MED ORDER — SERTRALINE HCL 50 MG PO TABS: 50.0000 mg | ORAL_TABLET | Freq: Every day | ORAL | Status: DC

## 2012-08-06 MED ORDER — LIVING WELL WITH DIABETES BOOK
Freq: Once | Status: AC
  Administered 2012-08-06: 13:00:00
  Filled 2012-08-06 (×2): qty 1

## 2012-08-06 MED ORDER — GLUCOSE BLOOD VI STRP: 100.0000 | ORAL_STRIP | Freq: Four times a day (QID) | Status: DC

## 2012-08-06 MED ORDER — "PEN NEEDLES 1/2"" 29G X 12MM MISC": 1.0000 | Status: DC | PRN

## 2012-08-06 NOTE — Progress Notes (Addendum)
Inpatient Diabetes Program Recommendations  AACE/ADA: New Consensus Statement on Inpatient Glycemic Control (2013)  Target Ranges:  Prepandial:   less than 140 mg/dL      Peak postprandial:   less than 180 mg/dL (1-2 hours)      Critically ill patients:  140 - 180 mg/dL   Elevated HgbA1C of 17.6% Pt's weight is 56 kg.  At a minimum, pt's total daily dose should be 0.8 units/kg, for a total of 45 units. Please consider increase to 22 units 70/30 bid and use a sensitive correction scale tidwc and HS scale. (if patient will agree to check cbg's) Will talk with patient today.  Thank you, Rosita Kea, RN, CNS, Diabetes Coordinator [phone])  Ad: Spoke with patient, attempted to engage in a conversation with her regarding what might be of help to this patient to assist her with taking her insulin and avoiding DKA.  When asked if she checks her blood sugars, she stated that she didn't know of anything but that she felt she would "just have to start checking them, I guess."  Asked her if she had ever used a 'correction' or 'sliding scale of insulin', and she replied that 'maybe, she thought might have'.  Finally I asked her why she thought she developed DKA this time and in the past. She stated that she just doesn't get out of bed in time to take her insulin in the morning.  It was terribly difficult to get the patient to talk at all about her diabetes in any way. Asked herif she would be willing to take a correction scale, and she answered "I would if I had to, I guess."  Emphasized how important it was that she take her 70/30 and correction if ordered.  She shook her head okay, but nothing further said. I think this patient would benefit from a psych consult for depression as she is not open in conversation about much of anything. Her eyes remained fixed on her lunch plate, and she continued to add salt to her chicken salad in excess.  She never ate while I was there, but kept her head down and away from  eye contact. Would like to be helpful to this patient, but I could not seem to get through any barriers. Thank you, Rosita Kea, RN, CNS, Diabetes Coordinator [phone])

## 2012-08-06 NOTE — Care Management Note (Signed)
    Page 1 of 1   08/06/2012     12:28:34 PM   CARE MANAGEMENT NOTE 08/06/2012  Patient:  Ashley Freeman, Ashley Freeman   Account Number:  [id number]  Date Initiated:  08/05/2012  Documentation initiated by:  Marvetta Gibbons  Subjective/Objective Assessment:   PT admitted with DKA     Action/Plan:   PTA pt lived at home with mom, NCM to follow for d/c needs   Anticipated DC Date:  07/30/2012   Anticipated DC Plan:  [place]  CM consult      Choice offered to / List presented to:             Status of service:  Completed, signed off Medicare Important Message given?   (If response is "NO", the following Medicare IM given date fields will be blank) Date Medicare IM given:   Date Additional Medicare IM given:    Discharge Disposition:  HOME/SELF CARE  Per UR Regulation:  Reviewed for med. necessity/level of care/duration of stay  If discussed at [place] of Stay Meetings, dates discussed:    Comments:  08/06/12 12:25 Tomi Bamberger RN, BSN [phone] patient for dc today, patient has medication coverage and she has money for the bus, patient is indep.  No needs anticipated.

## 2012-08-06 NOTE — Discharge Summary (Signed)
[facility] Discharge Note  Name: Ashley Freeman MRN: [id number] DOB: 1991-02-11 22 y.o.  Date of Admission: 08/04/2012  4:09 PM Date of Discharge: 08/06/2012 Attending Physician: Janell Quiet, MD  Discharge Diagnosis: Principal Problem:   DKA (diabetic ketoacidoses) Active Problems:   Diabetes mellitus type 1 (uncontrolled)   Hyponatremia Flat Affect  Discharge Medications:   Medication List    STOP taking these medications       insulin aspart 100 UNIT/ML injection  Commonly known as:  novoLOG      TAKE these medications       Flexpen Starter Kit Misc  1 kit by Other route once.     glucose blood test strip  Commonly known as:  ACCU-CHEK AVIVA PLUS  100 each by Other route 4 (four) times daily.     insulin aspart protamine-insulin aspart (70-30) 100 UNIT/ML injection  Commonly known as:  NOVOLOG 70/30  Inject 18 Units into the skin 2 (two) times daily with a meal.     PEN NEEDLES 29GX1/2" 29G X 12MM Misc  1 each by Does not apply route as needed.     sertraline 50 MG tablet  Commonly known as:  ZOLOFT  Take 1 tablet (50 mg total) by mouth daily.        Disposition and follow-up:   Ashley Freeman was discharged from [facility] in [place] condition.  At the hospital follow up visit please address diabetic compliance and if she is taking zoloft for mood.  Follow-up Appointments: Follow-up Information   Follow up with Gae Gallop, MD On 08/12/2012. (10:15AM)    Contact information:   [address] [place] [address] [phone]      Discharge Orders   Future Appointments Provider Department Dept Phone   08/12/2012 10:15 AM Gae Gallop, MD [place] [phone]   Future Orders Complete By Expires     Activity as tolerated - No restrictions  As directed     Ambulatory Referral to DSME/T  As directed     Questions:      Check all special needs  that apply to patient requiring 1 on 1 DSME/T:  Additional training    DSME/T Content:  Monitoring Diabetes    Psychological adjustment    Nutritional management    Medications    Diabetes as disease process    Goal setting, problem solving    Prevent, detect and treat acute complications    Prevent, detect and treat chronic complications    Complications/Comorbidities:      Choose type of training services and number of hours requested:  Follow-up DSME/T:  enter hours in comments    Call MD for:  persistant dizziness or light-headedness  As directed     Call MD for:  persistant nausea and vomiting  As directed     Call MD for:  As directed     Scheduling Instructions:      Blood sugar greater than 400 or if meter reads high.    Diet Carb Modified  As directed     Discharge instructions  As directed     Comments:      Please try to check sugars 2 times per day and be sure to take your insulin.       Consultations:  Diabetic educator  Admission HPI:  22 y.o with uncontrolled type 1 DM she presented to [facility] ED with hyperglycemia (fsbs 935),  dizziness, and shortness of breath with exertion and rest. She has been noncompliant with insulin x 2 days. She states she is taking 70/30 18 units bid but has missed doses due to being stressed out with school. She also is out of glucose strips. She has been eating salad, spaggetti and drinking diet and regular drinks. Symptoms that brought her into the ED include dizziness, sob with standing, Walking, getting in and out of bed, which she states these symptoms happen and she knows she is in DKA. Upon seeing the patient at [facility] these symptoms had resolved (i.e sob and dizziness). She did state she has been having increased thirst, increased urination, though she denies polyphagia.   Hospital Course by problem list:    DKA (diabetic ketoacidoses) - Patient came into the hospital with CBG of 900s and AG 37 with ketones and glucose in urine. She was  put on DKA protocol with insulin drip, potassium repletion, fluids. She was transitioned back to her novolog 70/30 the following day and was maintained on her home regimen without recurrence of anion gap. She was able to tolerate full diet and was discharged home in stable condition. During hospital stay multiple discussions were had with her trying to elucidate the etiology of her non-compliance. She maintained that she was stressed and did not take her insulin and did not have strips to check her sugars. She was educated extensively on the potential complications of her diabetes and seems to be in understanding of these. She did come up the goal of checking her sugars 2 times per day and taking her insulin as prescribed. No signs or source of infectious etiology precipitating this event.   Flat affect- The patient was fairly unaffected by her current health situation and we counseled her about her mood. She was agreeable to starting some medicine to help with her mood. It is the hope of this team that if her mood is better she will be more caring about her own health.   Diabetes mellitus type 1 (uncontrolled) - See above for full details. HgA1c 17.6 during this hospitalization.  Hyponatremia - Was pseudohyponatremia and resolved with lowering of glucose levels.     Discharge Vitals:  BP 124/77  Pulse 86  Temp(Src) 97.8 F (36.6 C) (Oral)  Resp 16  Ht 5\' 4"  (1.626 m)  Wt 128 lb 12.8 oz (58.423 kg)  BMI 22.1 kg/m2  SpO2 94%  LMP 07/22/2012  Discharge Labs:  Results for orders placed during the hospital encounter of 08/04/12 (from the past 24 hour(s))  BASIC METABOLIC PANEL     Status: Abnormal   Collection Time    08/05/12  4:42 PM      Result Value Range   Sodium 130 (*) 135 - 145 mEq/L   Potassium 4.1  3.5 - 5.1 mEq/L   Chloride 97  96 - 112 mEq/L   CO2 24  19 - 32 mEq/L   Glucose, Bld 307 (*) 70 - 99 mg/dL   BUN 7  6 - 23 mg/dL   Creatinine, Ser 0.51  0.50 - 1.10 mg/dL   Calcium  8.9  8.4 - 10.5 mg/dL   GFR calc non Af Amer >90  >90 mL/min   GFR calc Af Amer >90  >90 mL/min  HEMOGLOBIN A1C     Status: Abnormal   Collection Time    08/05/12  4:42 PM      Result Value Range   Hemoglobin A1C 17.6 (*) <5.7 %  Mean Plasma Glucose 458 (*) <117 mg/dL  GLUCOSE, CAPILLARY     Status: Abnormal   Collection Time    08/05/12  4:53 PM      Result Value Range   Glucose-Capillary 303 (*) 70 - 99 mg/dL   Comment 1 Notify RN     Comment 2 Documented in Chart    GLUCOSE, CAPILLARY     Status: Abnormal   Collection Time    08/05/12  9:25 PM      Result Value Range   Glucose-Capillary 344 (*) 70 - 99 mg/dL   Comment 1 Documented in Chart     Comment 2 Notify RN    GLUCOSE, CAPILLARY     Status: Abnormal   Collection Time    08/06/12  2:58 AM      Result Value Range   Glucose-Capillary 177 (*) 70 - 99 mg/dL   Comment 1 Documented in Chart     Comment 2 Notify RN    BASIC METABOLIC PANEL     Status: Abnormal   Collection Time    08/06/12  5:25 AM      Result Value Range   Sodium 135  135 - 145 mEq/L   Potassium 3.9  3.5 - 5.1 mEq/L   Chloride 101  96 - 112 mEq/L   CO2 23  19 - 32 mEq/L   Glucose, Bld 184 (*) 70 - 99 mg/dL   BUN 11  6 - 23 mg/dL   Creatinine, Ser 0.47 (*) 0.50 - 1.10 mg/dL   Calcium 9.0  8.4 - 10.5 mg/dL   GFR calc non Af Amer >90  >90 mL/min   GFR calc Af Amer >90  >90 mL/min  GLUCOSE, CAPILLARY     Status: Abnormal   Collection Time    08/06/12  7:49 AM      Result Value Range   Glucose-Capillary 202 (*) 70 - 99 mg/dL  GLUCOSE, CAPILLARY     Status: Abnormal   Collection Time    08/06/12 11:08 AM      Result Value Range   Glucose-Capillary 189 (*) 70 - 99 mg/dL  GLUCOSE, CAPILLARY     Status: Abnormal   Collection Time    08/06/12 11:45 AM      Result Value Range   Glucose-Capillary 128 (*) 70 - 99 mg/dL    Signed: Vertell Novak 08/06/2012, 1:35 PM   Time Spent on Discharge: 25 minutes Services Ordered on Discharge:  none Equipment Ordered on Discharge: none

## 2012-08-06 NOTE — Progress Notes (Signed)
Pt's CBG  Was rechecked and was down to 177 without any coverage.

## 2012-08-06 NOTE — Progress Notes (Signed)
NURSING PROGRESS NOTE  MAYRIN [place]ASK[place] [id number] Discharge Data: 08/06/2012 1:49 PM Attending Provider: No att. providers found [id number], Nino Glow, MD     [place] to be D/C'd Home per MD order.  Discussed with the patient the After Visit Summary and all questions fully answered. All IV's discontinued with no bleeding noted. All belongings returned to patient for patient to take home.   Last Vital Signs:  Blood pressure 124/77, pulse 86, temperature 97.8 F (36.6 C), temperature source Oral, resp. rate 16, height 5\' 4"  (1.626 m), weight 58.423 kg (128 lb 12.8 oz), last menstrual period 07/22/2012, SpO2 94.00%.  Discharge Medication List   Medication List    STOP taking these medications       insulin aspart 100 UNIT/ML injection  Commonly known as:  novoLOG      TAKE these medications       Flexpen Starter Kit Misc  1 kit by Other route once.     glucose blood test strip  Commonly known as:  ACCU-CHEK AVIVA PLUS  100 each by Other route 4 (four) times daily.     insulin aspart protamine-insulin aspart (70-30) 100 UNIT/ML injection  Commonly known as:  NOVOLOG 70/30  Inject 18 Units into the skin 2 (two) times daily with a meal.     PEN NEEDLES 29GX1/2" 29G X 12MM Misc  1 each by Does not apply route as needed.     sertraline 50 MG tablet  Commonly known as:  ZOLOFT  Take 1 tablet (50 mg total) by mouth daily.

## 2012-08-12 ENCOUNTER — Encounter: Payer: Self-pay | Admitting: Internal Medicine

## 2012-08-12 ENCOUNTER — Ambulatory Visit: Payer: Medicaid Other | Admitting: Radiation Oncology

## 2012-08-21 ENCOUNTER — Emergency Department (HOSPITAL_COMMUNITY)
Admission: EM | Admit: 2012-08-21 | Discharge: 2012-08-21 | Disposition: A | Discharge status: Home / Self Care | Payer: Medicaid Other | Attending: Emergency Medicine | Admitting: Emergency Medicine

## 2012-08-21 ENCOUNTER — Encounter (HOSPITAL_COMMUNITY): Payer: Self-pay | Admitting: Emergency Medicine

## 2012-08-21 DIAGNOSIS — E109 Type 1 diabetes mellitus without complications: Secondary | ICD-10-CM | POA: Insufficient documentation

## 2012-08-21 DIAGNOSIS — N764 Abscess of vulva: Secondary | ICD-10-CM

## 2012-08-21 DIAGNOSIS — Z8619 Personal history of other infectious and parasitic diseases: Secondary | ICD-10-CM | POA: Insufficient documentation

## 2012-08-21 DIAGNOSIS — R1084 Generalized abdominal pain: Secondary | ICD-10-CM | POA: Insufficient documentation

## 2012-08-21 DIAGNOSIS — Z794 Long term (current) use of insulin: Secondary | ICD-10-CM | POA: Insufficient documentation

## 2012-08-21 DIAGNOSIS — Z880 Allergy status to penicillin: Secondary | ICD-10-CM | POA: Insufficient documentation

## 2012-08-21 LAB — PREGNANCY, URINE: Preg Test, Ur: NEGATIVE

## 2012-08-21 MED ORDER — HYDROCODONE-ACETAMINOPHEN 5-325 MG PO TABS: ORAL_TABLET | ORAL | Status: DC

## 2012-08-21 MED ORDER — LIDOCAINE-EPINEPHRINE 2 %-1:100000 IJ SOLN
20.0000 mL | Freq: Once | INTRAMUSCULAR | Status: AC
  Administered 2012-08-21: 20 mL via INTRADERMAL
  Filled 2012-08-21: qty 20

## 2012-08-21 NOTE — ED Provider Notes (Signed)
History     CSN: [id number]  Arrival date & time 08/21/12  1051   First MD Initiated Contact with Patient 08/21/12 1120      Chief Complaint  Patient presents with  . Abscess    (Consider location/radiation/quality/duration/timing/severity/associated sxs/prior treatment) Patient is a 22 y.o. female presenting with abscess. The history is provided by the patient. No language interpreter was used.  Abscess Associated symptoms: no fever, no nausea and no vomiting   Pt is a [place] female with a hx of genital boils.  Pt c/o boil on labia that increased in pain, swelling, and redness for the past 5 days.  Reports boil in same area that required I&D before.  Pt states boils come up after shaving.  Has not tried any pain medications but has tried warm compresses w/o relief.  Does go to urgent care for f/u but does not recall provider's name.  Denies fever, n/v/d.  Pt does report diffuse abdominal pain and requests pregnancy test.    Past Medical History  Diagnosis Date  . Gonorrhea 08/2011    Treated in 09/2011  . Diabetes mellitus 2001    Diagnosed at age 15 ; Type I    Past Surgical History  Procedure Laterality Date  . No past surgeries      Family History  Problem Relation Age of Onset  . Anesthesia problems Neg Hx   . Asthma Mother   . Gout Father   . Diabetes Paternal Grandmother     History  Substance Use Topics  . Smoking status: Never Smoker   . Smokeless tobacco: Never Used  . Alcohol Use: No    OB History   Grav Para Term Preterm Abortions TAB SAB Ect Mult Living   1 0   1  1   0      Review of Systems  Constitutional: Negative for fever and chills.  Gastrointestinal: Positive for abdominal pain ( mild, diffuse). Negative for nausea, vomiting and diarrhea.  Skin: Positive for wound ( boil).    Allergies  Penicillins  Home Medications   Current Outpatient Rx  Name  Route  Sig  Dispense  Refill  . Flexpen Starter Kit MISC   Other   1 kit by Other  route once.   1 kit   0   . glucose blood (ACCU-CHEK AVIVA PLUS) test strip   Other   100 each by Other route 4 (four) times daily.   100 each   3   . insulin aspart protamine- aspart (NOVOLOG MIX 70/30 FLEXPEN) (70-30) 100 UNIT/ML injection   Subcutaneous   Inject 18 Units into the skin 2 (two) times daily with a meal.         . Insulin Pen Needle (PEN NEEDLES 29GX1/2") 29G X 12MM MISC   Does not apply   1 each by Does not apply route as needed.   100 each   6     Please provide appropriate size for FlexPen   . sertraline (ZOLOFT) 50 MG tablet   Oral   Take 1 tablet (50 mg total) by mouth daily.   30 tablet   1     LMP 07/22/2012  Physical Exam  Nursing note and vitals reviewed. Constitutional: She appears well-developed and well-nourished. No distress.  Thin female, laying on exam bed, NAD.   HENT:  Head: Normocephalic and atraumatic.  Eyes: Conjunctivae are normal. No scleral icterus.  Neck: Normal range of motion. Neck supple.  Cardiovascular: Normal rate,  regular rhythm and normal heart sounds.   Pulmonary/Chest: Effort normal and breath sounds normal. No respiratory distress. She has no wheezes. She has no rales. She exhibits no tenderness.  Abdominal: Soft. Bowel sounds are normal. She exhibits no distension. There is tenderness ( diffuse).  Genitourinary: Vagina normal.    There is tenderness and lesion ( 2cm indurated, erythemic lesion) on the left labia. No vaginal discharge found.  Chaperone present.   Musculoskeletal: Normal range of motion.  Neurological: She is alert.  Skin: Skin is warm and dry. She is not diaphoretic. There is erythema ( mild, left labia).    ED Course  INCISION AND DRAINAGE Date/Time: 08/21/2012 4:15 PM Performed by: Noland Fordyce Authorized by: Noland Fordyce Consent: Verbal consent obtained. written consent not obtained. Risks and benefits: risks, benefits and alternatives were discussed Consent given by:  patient Patient understanding: patient states understanding of the procedure being performed Patient consent: the patient's understanding of the procedure matches consent given Procedure consent: procedure consent matches procedure scheduled Patient identity confirmed: verbally with patient Time out: Immediately prior to procedure a "time out" was called to verify the correct patient, procedure, equipment, support staff and site/side marked as required. Type: abscess Body area: anogenital (left labia majora) Anesthesia: local infiltration Local anesthetic: lidocaine 2% with epinephrine Anesthetic total: 2 ml Patient sedated: no Scalpel size: 11 Incision type: single straight Complexity: simple Drainage: serosanguinous and purulent Drainage amount: moderate Wound treatment: wound left open Patient tolerance: Patient tolerated the procedure well with no immediate complications.   (including critical care time)  Labs Reviewed - No data to display No results found.   No diagnosis found.    MDM  Pt c/o boil on labia that started Sunday and has progressively worsened.  Hx of similar problem.  Occurs every time she shaves.  Has had I&D before.  PE: 2-3cm abscess on left labia. No inguinal adenopathy or red streaking.   I&D preformed.  See procedure note.   Pt also requesting pregnancy test.    Urine Preg: neg  Rx: norco.  Pt advised to f/u with PCP or urgent care if signs of infection or abscess is not healing.  May need antibiotics later on.  Advised pt to use clean razors when shaving and use warm soapy water for exfoliating skin to help prevent ingrown hairs and boils from forming.  Vitals: unremarkable. Discharged in stable condition.    Discussed pt with attending during ED encounter.        Noland Fordyce, PA-C 08/21/12 1622

## 2012-08-21 NOTE — ED Notes (Signed)
Pt reports boil to labia that began Sunday after shaving. Pt reports reddness, swelling and pain to area. Reports hx of abscess to same area prior.

## 2012-08-23 NOTE — ED Provider Notes (Signed)
Medical screening examination/treatment/procedure(s) were performed by non-physician practitioner and as supervising physician I was immediately available for consultation/collaboration.   Hoy Morn, MD 08/23/12 1124

## 2012-09-15 ENCOUNTER — Encounter: Payer: Medicaid Other | Admitting: Internal Medicine

## 2012-09-25 ENCOUNTER — Encounter (HOSPITAL_COMMUNITY): Payer: Self-pay | Admitting: Emergency Medicine

## 2012-09-25 ENCOUNTER — Emergency Department (HOSPITAL_COMMUNITY): Payer: Medicaid Other

## 2012-09-25 ENCOUNTER — Emergency Department (HOSPITAL_COMMUNITY)
Admission: EM | Admit: 2012-09-25 | Discharge: 2012-09-25 | Disposition: A | Discharge status: Home / Self Care | Payer: Medicaid Other | Source: Home / Self Care

## 2012-09-25 ENCOUNTER — Inpatient Hospital Stay (HOSPITAL_COMMUNITY)
Admission: EM | Admit: 2012-09-25 | Discharge: 2012-09-26 | DRG: 637 | Discharge status: Against Medical Advice | Payer: Medicaid Other | Attending: Pulmonary Disease | Admitting: Pulmonary Disease

## 2012-09-25 ENCOUNTER — Encounter (HOSPITAL_COMMUNITY): Payer: Self-pay | Admitting: *Deleted

## 2012-09-25 DIAGNOSIS — Z794 Long term (current) use of insulin: Secondary | ICD-10-CM

## 2012-09-25 DIAGNOSIS — Z791 Long term (current) use of non-steroidal anti-inflammatories (NSAID): Secondary | ICD-10-CM

## 2012-09-25 DIAGNOSIS — E1110 Type 2 diabetes mellitus with ketoacidosis without coma: Secondary | ICD-10-CM

## 2012-09-25 DIAGNOSIS — R0609 Other forms of dyspnea: Secondary | ICD-10-CM | POA: Diagnosis present

## 2012-09-25 DIAGNOSIS — E109 Type 1 diabetes mellitus without complications: Secondary | ICD-10-CM

## 2012-09-25 DIAGNOSIS — E875 Hyperkalemia: Secondary | ICD-10-CM

## 2012-09-25 DIAGNOSIS — E878 Other disorders of electrolyte and fluid balance, not elsewhere classified: Secondary | ICD-10-CM | POA: Diagnosis present

## 2012-09-25 DIAGNOSIS — E1010 Type 1 diabetes mellitus with ketoacidosis without coma: Principal | ICD-10-CM | POA: Diagnosis present

## 2012-09-25 DIAGNOSIS — E872 Acidosis, unspecified: Secondary | ICD-10-CM

## 2012-09-25 DIAGNOSIS — R000 Tachycardia, unspecified: Secondary | ICD-10-CM | POA: Diagnosis present

## 2012-09-25 DIAGNOSIS — G9340 Encephalopathy, unspecified: Secondary | ICD-10-CM | POA: Diagnosis present

## 2012-09-25 DIAGNOSIS — D72829 Elevated white blood cell count, unspecified: Secondary | ICD-10-CM

## 2012-09-25 DIAGNOSIS — E876 Hypokalemia: Secondary | ICD-10-CM | POA: Diagnosis present

## 2012-09-25 DIAGNOSIS — R0989 Other specified symptoms and signs involving the circulatory and respiratory systems: Secondary | ICD-10-CM | POA: Diagnosis present

## 2012-09-25 DIAGNOSIS — D649 Anemia, unspecified: Secondary | ICD-10-CM | POA: Diagnosis present

## 2012-09-25 LAB — GLUCOSE, CAPILLARY
Glucose-Capillary: >600 mg/dL (ref 70–99)
Glucose-Capillary: >600 mg/dL (ref 70–99)
Glucose-Capillary: >600 mg/dL (ref 70–99)
Glucose-Capillary: 585 mg/dL (ref 70–99)

## 2012-09-25 LAB — BASIC METABOLIC PANEL
BUN: 19 mg/dL (ref 6–23)
CO2: <7 mEq/L — CL (ref 19–32)
Chloride: 94 mEq/L — ABNORMAL LOW (ref 96–112)
Creatinine, Ser: 0.71 mg/dL (ref 0.50–1.10)
Glucose, Bld: 688 mg/dL (ref 70–99)
Sodium: 135 mEq/L (ref 135–145)

## 2012-09-25 LAB — URINALYSIS, ROUTINE W REFLEX MICROSCOPIC
Bilirubin Urine: NEGATIVE
Ketones, ur: >80 mg/dL — AB
Nitrite: NEGATIVE
Specific Gravity, Urine: 1.032 — ABNORMAL HIGH (ref 1.005–1.030)
pH: 5 (ref 5.0–8.0)

## 2012-09-25 LAB — BLOOD GAS, VENOUS
Bicarbonate: 4.6 mEq/L — ABNORMAL LOW (ref 20.0–24.0)
O2 Saturation: 80.2 %
Patient temperature: 98.6
TCO2: 4.8 mmol/L (ref 0–100)

## 2012-09-25 LAB — CBC
MCH: 29 pg (ref 26.0–34.0)
MCV: 88.7 fL (ref 78.0–100.0)
Platelets: 392 10*3/uL (ref 150–400)
RBC: 5.49 MIL/uL — ABNORMAL HIGH (ref 3.87–5.11)

## 2012-09-25 MED ORDER — SODIUM CHLORIDE 0.9 % IV SOLN: 1000.0000 mL | INTRAVENOUS | Status: DC

## 2012-09-25 MED ORDER — LORAZEPAM 2 MG/ML IJ SOLN
0.5000 mg | Freq: Once | INTRAMUSCULAR | Status: AC
  Administered 2012-09-25: 0.5 mg via INTRAVENOUS
  Filled 2012-09-25: qty 1

## 2012-09-25 MED ORDER — SODIUM CHLORIDE 0.9 % IV SOLN
1000.0000 mL | Freq: Once | INTRAVENOUS | Status: AC
  Administered 2012-09-25: 1000 mL via INTRAVENOUS

## 2012-09-25 MED ORDER — SODIUM BICARBONATE 8.4 % IV SOLN
50.0000 meq | Freq: Once | INTRAVENOUS | Status: AC
  Administered 2012-09-25: 50 meq via INTRAVENOUS
  Filled 2012-09-25: qty 50

## 2012-09-25 MED ORDER — ONDANSETRON HCL 4 MG/2ML IJ SOLN
4.0000 mg | Freq: Once | INTRAMUSCULAR | Status: DC
  Filled 2012-09-25: qty 2

## 2012-09-25 MED ORDER — SODIUM CHLORIDE 0.9 % IV SOLN
INTRAVENOUS | Status: DC
  Administered 2012-09-25: 20:00:00 via INTRAVENOUS
  Filled 2012-09-25 (×2): qty 1

## 2012-09-25 MED ORDER — MORPHINE SULFATE 2 MG/ML IJ SOLN
2.0000 mg | Freq: Once | INTRAMUSCULAR | Status: AC
  Administered 2012-09-25: 2 mg via INTRAVENOUS
  Filled 2012-09-25: qty 1

## 2012-09-25 MED ORDER — ONDANSETRON HCL 4 MG/2ML IJ SOLN
4.0000 mg | Freq: Once | INTRAMUSCULAR | Status: AC
  Administered 2012-09-25: 4 mg via INTRAVENOUS
  Filled 2012-09-25: qty 2

## 2012-09-25 NOTE — ED Notes (Signed)
Pt reports DKA.  Hx of same.  States that she has not taken insulin today because she was nauseated and didn't feel like getting up to take it.  No vomiting noted.  Pt noted to be on cell phone in triage.

## 2012-09-25 NOTE — ED Provider Notes (Addendum)
Medical screening examination/treatment/procedure(s) were conducted as a shared visit with non-physician practitioner(s) and myself.  I personally evaluated the patient during the encounter  Pt seen and examined--labs c/w dka, iv fluids ordered, will admit to medicine  11:39 PM Patient rechecked and is more somnolent at this time but does follow commands. Will require admission to the ICU.  Leota Jacobsen, MD 09/25/12 2230  Leota Jacobsen, MD 09/25/12 [phone]

## 2012-09-25 NOTE — ED Notes (Signed)
IV attempt unsuccessful x3.

## 2012-09-25 NOTE — ED Notes (Signed)
Per patient, here for "DKA", went to [facility] for same symptoms and did not stay because she didn't want to wait

## 2012-09-25 NOTE — ED Provider Notes (Signed)
History     CSN: [id number]  Arrival date & time 09/25/12  1846   First MD Initiated Contact with Patient 09/25/12 1915      Chief Complaint  Patient presents with  . Hyperglycemia    (Consider location/radiation/quality/duration/timing/severity/associated sxs/prior treatment) Patient is a 22 y.o. female presenting with hyperglycemia. The history is provided by the patient and medical records. No language interpreter was used.  Hyperglycemia Blood sugar level PTA:  Unknown as pt does not check it at home Severity:  Moderate Onset quality:  Gradual Duration:  1 day Timing:  Constant Progression:  Worsening Chronicity:  Recurrent Diabetes status:  Controlled with insulin Current diabetic therapy:  Novolog mix 70/30 Time since last antidiabetic medication:  1 day Context: not change in medication, not insulin pump use, not new diabetes diagnosis, not recent change in diet and not recent illness   Relieved by:  None tried Ineffective treatments:  None tried Associated symptoms: dehydration, fatigue, increased thirst, nausea, polyuria, vomiting and weakness   Associated symptoms: no abdominal pain, no altered mental status, no blurred vision, no chest pain, no confusion, no diaphoresis, no dizziness, no dysuria, no fever, no increased appetite, no malaise, no shortness of breath, no syncope and no weight change   Risk factors: hx of DKA   Risk factors: no obesity, no pancreatic disease and no recent steroid use     Ashley Freeman is a 22 y.o. female  with a hx of IDDM, DKA presents to the Emergency Department complaining of gradual, persistent, progressively worsening weakness and nausea with several episodes of vomiting beginning this morning. Patient states that she did not take her insulin today because she felt too tired. She states she sees Dr. Aura Fey as her primary care but missed her last appointment because she forgot.  Patient states she normally takes her  insulin as directed however she does not check her blood sugar every day.  Nothing makes symptoms better or worse. She denies fever, chills, headache, neck pain, chest pain, shortness of breath, abdominal pain, diarrhea, dizziness, syncope, dysuria, hematuria. Patient does endorse bilateral breast pain for several days which is currently her main complaint. She states she is sexually active and her last menstrual period is occurring currently but admits she could be pregnant as she is not using any birth control.  Patient takes NovoLog 70/30 at home for blood sugar control.    Past Medical History  Diagnosis Date  . Gonorrhea 08/2011    Treated in 09/2011  . Diabetes mellitus 2001    Diagnosed at age 77 ; Type I    Past Surgical History  Procedure Laterality Date  . No past surgeries      Family History  Problem Relation Age of Onset  . Anesthesia problems Neg Hx   . Asthma Mother   . Gout Father   . Diabetes Paternal Grandmother     History  Substance Use Topics  . Smoking status: Never Smoker   . Smokeless tobacco: Never Used  . Alcohol Use: No    OB History   Grav Para Term Preterm Abortions TAB SAB Ect Mult Living   1 0   1  1   0      Review of Systems  Constitutional: Positive for fatigue. Negative for fever, diaphoresis, appetite change and unexpected weight change.  HENT: Negative for mouth sores and neck stiffness.   Eyes: Negative for blurred vision and visual disturbance.  Respiratory: Negative for cough, chest tightness,  shortness of breath and wheezing.   Cardiovascular: Negative for chest pain and syncope.  Gastrointestinal: Positive for nausea and vomiting. Negative for abdominal pain, diarrhea and constipation.  Endocrine: Positive for polydipsia and polyuria. Negative for polyphagia.  Genitourinary: Negative for dysuria, urgency, frequency and hematuria.  Musculoskeletal: Negative for back pain.  Skin: Negative for rash.  Allergic/Immunologic: Negative  for immunocompromised state.  Neurological: Negative for dizziness, syncope, light-headedness and headaches.  Hematological: Does not bruise/bleed easily.  Psychiatric/Behavioral: Negative for confusion, sleep disturbance and altered mental status. The patient is not nervous/anxious.     Allergies  Penicillins  Home Medications   Current Outpatient Rx  Name  Route  Sig  Dispense  Refill  . ibuprofen (ADVIL,MOTRIN) 200 MG tablet   Oral   Take 600 mg by mouth every 8 (eight) hours as needed for pain.         [name] insulin aspart protamine- aspart (NOVOLOG MIX 70/30 FLEXPEN) (70-30) 100 UNIT/ML injection   Subcutaneous   Inject 18 Units into the skin 2 (two) times daily with a meal.           BP 130/89  Pulse 140  Temp(Src) 97.8 F (36.6 C) (Oral)  Resp 37  SpO2 100%  LMP 09/25/2012  Physical Exam  Nursing note and vitals reviewed. Constitutional: She is oriented to person, place, and time. She appears well-developed and well-nourished. No distress.  Pt actively vomiting on exam  HENT:  Head: Normocephalic and atraumatic.  Mouth/Throat: Uvula is midline. Mucous membranes are dry. No edematous. No oropharyngeal exudate, posterior oropharyngeal edema, posterior oropharyngeal erythema or tonsillar abscesses.  Eyes: Conjunctivae are normal. Pupils are equal, round, and reactive to light. No scleral icterus.  Neck: Normal range of motion. Neck supple.  Cardiovascular: Regular rhythm, normal heart sounds and intact distal pulses.  Tachycardia present.   No murmur heard. Pulses:      Radial pulses are 2+ on the right side, and 2+ on the left side.       Dorsalis pedis pulses are 2+ on the right side, and 2+ on the left side.       Posterior tibial pulses are 2+ on the right side, and 2+ on the left side.  Pulmonary/Chest: Effort normal and breath sounds normal. No respiratory distress. She has no decreased breath sounds. She has no wheezes. She has no rhonchi. She has no rales.   Abdominal: Soft. Normal appearance and bowel sounds are normal. She exhibits no mass. There is no tenderness. There is no rigidity, no rebound, no guarding and no CVA tenderness.  Genitourinary: There is breast tenderness (bilateral nipple tenderness, no induation, erythema or discharge). No breast swelling, discharge or bleeding.  Musculoskeletal: Normal range of motion. She exhibits no edema and no tenderness.  Lymphadenopathy:    She has no cervical adenopathy.  Neurological: She is alert and oriented to person, place, and time. She exhibits normal muscle tone. Coordination normal.  Speech is clear and goal oriented Moves extremities without ataxia  Skin: Skin is warm and dry. No rash noted. She is not diaphoretic. No erythema.  Psychiatric: She has a normal mood and affect.    ED Course  Procedures (including critical care time)  Labs Reviewed  GLUCOSE, CAPILLARY - Abnormal; Notable for the following:    Glucose-Capillary >600 (*)    All other components within normal limits  CBC - Abnormal; Notable for the following:    WBC 22.8 (*)    RBC 5.49 (*)  Hemoglobin 15.9 (*)    HCT 48.7 (*)    All other components within normal limits  URINALYSIS, ROUTINE W REFLEX MICROSCOPIC - Abnormal; Notable for the following:    Specific Gravity, Urine 1.032 (*)    Glucose, UA >1000 (*)    Hgb urine dipstick SMALL (*)    Ketones, ur >80 (*)    Protein, ur 30 (*)    All other components within normal limits  BASIC METABOLIC PANEL - Abnormal; Notable for the following:    Potassium 5.5 (*)    Chloride 94 (*)    CO2 <7 (*)    Glucose, Bld 688 (*)    Calcium 10.6 (*)    All other components within normal limits  BLOOD GAS, VENOUS - Abnormal; Notable for the following:    pH, Ven 6.864 (*)    pCO2, Ven 26.9 (*)    pO2, Ven 68.8 (*)    Bicarbonate 4.6 (*)    All other components within normal limits  GLUCOSE, CAPILLARY - Abnormal; Notable for the following:    Glucose-Capillary >600  (*)    All other components within normal limits  GLUCOSE, CAPILLARY - Abnormal; Notable for the following:    Glucose-Capillary 585 (*)    All other components within normal limits  BLOOD GAS, ARTERIAL - Abnormal; Notable for the following:    pH, Arterial 6.931 (*)    pCO2 arterial 7.9 (*)    pO2, Arterial 145.0 (*)    Bicarbonate 1.6 (*)    All other components within normal limits  GLUCOSE, CAPILLARY - Abnormal; Notable for the following:    Glucose-Capillary 366 (*)    All other components within normal limits  URINE MICROSCOPIC-ADD ON  URINE RAPID DRUG SCREEN (HOSP PERFORMED)  BASIC METABOLIC PANEL  ETHANOL  POCT PREGNANCY, URINE   Dg Chest Port 1 View  09/25/2012   *RADIOLOGY REPORT*  Clinical Data: Severe shortness of breath.  PORTABLE CHEST - 1 VIEW  Comparison: Chest radiograph performed 05/06/2012  Findings: The lungs are relatively well-aerated and clear.  There is no evidence of focal opacification, pleural effusion or pneumothorax.  The cardiomediastinal silhouette is within normal limits.  No acute osseous abnormalities are seen.  IMPRESSION: No acute cardiopulmonary process seen.   Original Report Authenticated By: Santa Lighter, M.D.    ECG:  Date: 09/25/2012  Rate: 145  Rhythm: sinus tachycardia  QRS Axis: normal  Intervals: normal  ST/T Wave abnormalities: normal  Conduction Disutrbances:none  Narrative Interpretation: nonischemic ECG, tachycardia; compared to Oct 2013  Old EKG Reviewed: changes noted    1. DKA (diabetic ketoacidoses)   2. Hyperkalemia   3. Diabetes mellitus type 1 (uncontrolled)     CRITICAL CARE Performed by: Abigail Butts Total critical care time: 1 hour Critical care time was exclusive of separately billable procedures and treating other patients. Critical care was necessary to treat or prevent imminent or life-threatening deterioration. Critical care was time spent personally by me on the following activities: development  of treatment plan with patient and/or surrogate as well as nursing, discussions with consultants, evaluation of patient's response to treatment, examination of patient, obtaining history from patient or surrogate, ordering and performing treatments and interventions, ordering and review of laboratory studies, ordering and review of radiographic studies, pulse oximetry and re-evaluation of patient's condition.     MDM  [place] presents with elevated glucose and breast pain.  CBG >600 and concern for DKA.  Pt alert, oriented and vomiting on exam.  Hx of DKA and noncompliance of her diabetes medications.    10:56 PM Pt now with decreased mental status, tachypnea and increasing tachycardia.  Obtaining a 2nd IV line. Pt remains normotensive, but pt presentation is worsening.  VBG pending.  Will proceed with admission to critical care.    BP 116/81  Pulse 140  Temp(Src) 97.8 F (36.6 C) (Oral)  Resp 41  SpO2 100%  LMP 09/25/2012   11:09 PM Venous blood gas with pH of 6.864; pCO2 26.9; pO2 68.8; bicarb 4.6; TCO2 4.8  12:37 AM Repeat arterial gas with persistent concerning results and significant uncompensated metabolic acidosis.  Discussed with critical care who recommends repeat lab work and bicarb 65mEq.  Pt with some improved mental status at this time.  She continues to protect her airway and I do not believe she needs to be intubated.    Dr. Zenia Resides was consulted, evaluated this patient with me and agrees with the plan.    1:19 AM Pt resting comfortably with improved mental status.  HR decrease into 130s, normotensive.  Pt repeat labs pending. Critical care will evaluate in the department and admit.    Dr Linton Flemings is aware of the patient and will follow as needed.                 Jarrett Soho Carline Dura, PA-C 09/26/12 0122

## 2012-09-25 NOTE — ED Notes (Signed)
Per EMT Marye Round), patient got up and left because she did not want to wait any longer.  Called patient's cell phone and advised her that she needs medical care for her blood sugar is high.  Patient states that she does not want to wait and will go to [facility] for help.  Again, explained to patient the danger her blood sugar being high.  Patient hung up the phone.

## 2012-09-25 NOTE — ED Notes (Signed)
Pt upset and crying on phone stating "I can't sit here and they won't give me a room" Informed pt we would get her to an exam room as soon as possible.

## 2012-09-25 NOTE — ED Notes (Signed)
Pt seen walking out the ER entrance doors leaving with visitor.

## 2012-09-26 DIAGNOSIS — E1110 Type 2 diabetes mellitus with ketoacidosis without coma: Secondary | ICD-10-CM

## 2012-09-26 DIAGNOSIS — E875 Hyperkalemia: Secondary | ICD-10-CM

## 2012-09-26 DIAGNOSIS — D72829 Elevated white blood cell count, unspecified: Secondary | ICD-10-CM

## 2012-09-26 DIAGNOSIS — E109 Type 1 diabetes mellitus without complications: Secondary | ICD-10-CM

## 2012-09-26 DIAGNOSIS — E872 Acidosis: Secondary | ICD-10-CM

## 2012-09-26 LAB — BASIC METABOLIC PANEL
BUN: 11 mg/dL (ref 6–23)
BUN: 11 mg/dL (ref 6–23)
BUN: 9 mg/dL (ref 6–23)
CO2: 11 mEq/L — ABNORMAL LOW (ref 19–32)
CO2: 15 mEq/L — ABNORMAL LOW (ref 19–32)
CO2: 19 mEq/L (ref 19–32)
Calcium: 7.6 mg/dL — ABNORMAL LOW (ref 8.4–10.5)
Calcium: 7.6 mg/dL — ABNORMAL LOW (ref 8.4–10.5)
Calcium: 8.4 mg/dL (ref 8.4–10.5)
Calcium: 8.2 mg/dL — ABNORMAL LOW (ref 8.4–10.5)
Chloride: 109 mEq/L (ref 96–112)
Chloride: 114 mEq/L — ABNORMAL HIGH (ref 96–112)
Chloride: 105 mEq/L (ref 96–112)
Creatinine, Ser: 0.56 mg/dL (ref 0.50–1.10)
Creatinine, Ser: 0.56 mg/dL (ref 0.50–1.10)
GFR calc Af Amer: >90 mL/min (ref >90)
GFR calc Af Amer: >90 mL/min (ref >90)
GFR calc non Af Amer: >90 mL/min (ref >90)
Glucose, Bld: 115 mg/dL — ABNORMAL HIGH (ref 70–99)
Glucose, Bld: 187 mg/dL — ABNORMAL HIGH (ref 70–99)
Glucose, Bld: 199 mg/dL — ABNORMAL HIGH (ref 70–99)
Potassium: 3.3 mEq/L — ABNORMAL LOW (ref 3.5–5.1)
Potassium: 3 mEq/L — ABNORMAL LOW (ref 3.5–5.1)
Sodium: 135 mEq/L (ref 135–145)

## 2012-09-26 LAB — CBC
HCT: 28.9 % — ABNORMAL LOW (ref 36.0–46.0)
Hemoglobin: 9.6 g/dL — ABNORMAL LOW (ref 12.0–15.0)
MCHC: 33.2 g/dL (ref 30.0–36.0)
Platelets: 262 10*3/uL (ref 150–400)
RDW: 13.1 % (ref 11.5–15.5)
WBC: 20.9 10*3/uL — ABNORMAL HIGH (ref 4.0–10.5)

## 2012-09-26 LAB — RAPID URINE DRUG SCREEN, HOSP PERFORMED
Amphetamines: NOT DETECTED
Benzodiazepines: NOT DETECTED
Opiates: NOT DETECTED
Tetrahydrocannabinol: NOT DETECTED

## 2012-09-26 LAB — MRSA PCR SCREENING: MRSA by PCR: NEGATIVE

## 2012-09-26 LAB — GLUCOSE, CAPILLARY
Glucose-Capillary: 106 mg/dL — ABNORMAL HIGH (ref 70–99)
Glucose-Capillary: 108 mg/dL — ABNORMAL HIGH (ref 70–99)
Glucose-Capillary: 113 mg/dL — ABNORMAL HIGH (ref 70–99)
Glucose-Capillary: 182 mg/dL — ABNORMAL HIGH (ref 70–99)
Glucose-Capillary: 184 mg/dL — ABNORMAL HIGH (ref 70–99)
Glucose-Capillary: 186 mg/dL — ABNORMAL HIGH (ref 70–99)
Glucose-Capillary: 207 mg/dL — ABNORMAL HIGH (ref 70–99)
Glucose-Capillary: 231 mg/dL — ABNORMAL HIGH (ref 70–99)
Glucose-Capillary: 259 mg/dL — ABNORMAL HIGH (ref 70–99)
Glucose-Capillary: 325 mg/dL — ABNORMAL HIGH (ref 70–99)
Glucose-Capillary: 366 mg/dL — ABNORMAL HIGH (ref 70–99)
Glucose-Capillary: 99 mg/dL (ref 70–99)

## 2012-09-26 LAB — BLOOD GAS, ARTERIAL
Acid-Base Excess: 0 mmol/L (ref 0.0–2.0)
Bicarbonate: 1.6 mEq/L — ABNORMAL LOW (ref 20.0–24.0)
O2 Saturation: 96.1 %
TCO2: 1.6 mmol/L (ref 0–100)
pO2, Arterial: 145 mmHg — ABNORMAL HIGH (ref 80.0–100.0)

## 2012-09-26 MED ORDER — POTASSIUM CHLORIDE CRYS ER 20 MEQ PO TBCR
EXTENDED_RELEASE_TABLET | ORAL | Status: AC
  Filled 2012-09-26: qty 2

## 2012-09-26 MED ORDER — SODIUM CHLORIDE 0.9 % IV SOLN: INTRAVENOUS | Status: DC

## 2012-09-26 MED ORDER — PNEUMOCOCCAL VAC POLYVALENT 25 MCG/0.5ML IJ INJ: 0.5000 mL | INJECTION | INTRAMUSCULAR | Status: DC

## 2012-09-26 MED ORDER — DEXTROSE-NACL 5-0.45 % IV SOLN
INTRAVENOUS | Status: DC
  Administered 2012-09-26: 75 mL/h via INTRAVENOUS

## 2012-09-26 MED ORDER — SODIUM CHLORIDE 0.9 % IV SOLN
INTRAVENOUS | Status: AC
  Administered 2012-09-26: 999 mL/h via INTRAVENOUS

## 2012-09-26 MED ORDER — POTASSIUM CHLORIDE CRYS ER 20 MEQ PO TBCR: 40.0000 meq | EXTENDED_RELEASE_TABLET | Freq: Once | ORAL | Status: DC

## 2012-09-26 MED ORDER — HEPARIN SODIUM (PORCINE) 5000 UNIT/ML IJ SOLN
5000.0000 [IU] | Freq: Three times a day (TID) | INTRAMUSCULAR | Status: DC
  Administered 2012-09-26 (×2): 5000 [IU] via SUBCUTANEOUS
  Filled 2012-09-26 (×4): qty 1

## 2012-09-26 MED ORDER — SODIUM BICARBONATE 8.4 % IV SOLN
INTRAVENOUS | Status: DC
  Administered 2012-09-26: 12:00:00 via INTRAVENOUS
  Filled 2012-09-26 (×3): qty 150

## 2012-09-26 MED ORDER — DEXTROSE 50 % IV SOLN: 25.0000 mL | INTRAVENOUS | Status: DC | PRN

## 2012-09-26 MED ORDER — INSULIN GLARGINE 100 UNIT/ML ~~LOC~~ SOLN
15.0000 [IU] | SUBCUTANEOUS | Status: DC
  Filled 2012-09-26: qty 0.15

## 2012-09-26 MED ORDER — POTASSIUM CHLORIDE 10 MEQ/100ML IV SOLN
10.0000 meq | INTRAVENOUS | Status: AC
  Administered 2012-09-26 (×2): 10 meq via INTRAVENOUS
  Filled 2012-09-26: qty 400

## 2012-09-26 MED ORDER — SODIUM CHLORIDE 0.9 % IV SOLN
INTRAVENOUS | Status: DC
  Administered 2012-09-26: 3.3 [IU]/h via INTRAVENOUS
  Filled 2012-09-26: qty 1

## 2012-09-26 MED ORDER — POTASSIUM CHLORIDE 20 MEQ/15ML (10%) PO LIQD
20.0000 meq | Freq: Once | ORAL | Status: AC
  Administered 2012-09-26: 20 meq via ORAL
  Filled 2012-09-26: qty 15

## 2012-09-26 NOTE — ED Notes (Signed)
Attempt to draw labs off of the peripheral line unsuccessfully.  Venipuncture attempted without success.

## 2012-09-26 NOTE — H&P (Addendum)
PULMONARY  / CRITICAL CARE MEDICINE  Name: Ashley Freeman MRN: [id number] DOB: July 16, 1990    ADMISSION DATE:  09/25/2012 CONSULTATION DATE:  09/25/2012  REFERRING MD :  Odette Horns PRIMARY SERVICE: PCCM  CHIEF COMPLAINT:  DKA  BRIEF PATIENT DESCRIPTION: 22 y/o female with DKA, confusion, nausea and vomiting was admitted from the [facility] ED on 09/26/2012.    SIGNIFICANT EVENTS / STUDIES:  5/30 Admission >>  LINES / TUBES:   CULTURES: 5/30 blood >>  ANTIBIOTICS:   HISTORY OF PRESENT ILLNESS:  22 y/o female with DKA, confusion, nausea and vomiting was admitted from the [facility] ED on 09/26/2012.  She stated that she had been feeling well until the morning of 5/29 when she developed nausea, vomiting, and abdominal cramping.  Later in the evening she became short of breath and then came to the [facility] ED.  There she was found to be confused.  She notes that an aunt had been sick with a viral gastroenteritis recently.  She was given IVF and insulin and PCCM was asked to admit.  PAST MEDICAL HISTORY :  Past Medical History  Diagnosis Date  . Gonorrhea 08/2011    Treated in 09/2011  . Diabetes mellitus 2001    Diagnosed at age 56 ; Type I   Past Surgical History  Procedure Laterality Date  . No past surgeries     Prior to Admission medications   Medication Sig Start Date End Date Taking? Authorizing Provider  ibuprofen (ADVIL,MOTRIN) 200 MG tablet Take 600 mg by mouth every 8 (eight) hours as needed for pain.   Yes Historical Provider, MD  insulin aspart protamine- aspart (NOVOLOG MIX 70/30 FLEXPEN) (70-30) 100 UNIT/ML injection Inject 18 Units into the skin 2 (two) times daily with a meal.   Yes Historical Provider, MD   Allergies  Allergen Reactions  . Penicillins Hives    FAMILY HISTORY:  Family History  Problem Relation Age of Onset  . Anesthesia problems Neg Hx   . Asthma Mother   . Gout Father   . Diabetes Paternal Grandmother    SOCIAL HISTORY:  reports that she has never  smoked. She has never used smokeless tobacco. She reports that she does not drink alcohol or use illicit drugs.  REVIEW OF SYSTEMS:   Gen: Denies fever, + chills, weight change, fatigue, night sweats HEENT: Denies blurred vision, double vision, hearing loss, tinnitus, sinus congestion, rhinorrhea, sore throat, neck stiffness, dysphagia PULM: Denies shortness of breath, cough, sputum production, hemoptysis, wheezing CV: Denies chest pain, edema, orthopnea, paroxysmal nocturnal dyspnea, palpitations GI:  Per HPI GU: Denies dysuria, hematuria, polyuria, oliguria, urethral discharge Endocrine: Denies hot or cold intolerance, polyuria, polyphagia or appetite change Derm: Denies rash, dry skin, scaling or peeling skin change Heme: Denies easy bruising, bleeding, bleeding gums Neuro: Denies headache, numbness, weakness, slurred speech, loss of memory or consciousness   SUBJECTIVE:   VITAL SIGNS: Temp:  [97.8 F (36.6 C)] 97.8 F (36.6 C) (05/29 1902) Pulse Rate:  [114-144] 140 (05/29 2234) Resp:  [20-41] 37 (05/29 2330) BP: (116-138)/(70-94) 130/89 mmHg (05/29 2330) SpO2:  [100 %] 100 % (05/29 2330) HEMODYNAMICS:   VENTILATOR SETTINGS:   INTAKE / OUTPUT: Intake/Output     05/29 0701 - 05/30 0700   Urine 850   Total Output 850   Net -850         PHYSICAL EXAMINATION:  General:  Resting comfortably Neuro:  Awake, alert, CN II-XII; strength 5/5 all over HEENT:  NCAT, PERRL  EOMi, MMM Cardiovascular:  Tachy, regular, no mgr Lungs:  CTA B Abdomen:  BS+, soft, nontender Musculoskeletal:  Normal bulk and tone Skin:  Dry, no rash  LABS:  Recent Labs Lab 09/25/12 2135 09/25/12 2320  HGB 15.9*  --   WBC 22.8*  --   PLT 392  --   NA 135  --   K 5.5*  --   CL 94*  --   CO2 <7*  --   GLUCOSE 688*  --   BUN 19  --   CREATININE 0.71  --   CALCIUM 10.6*  --   PHART  --  6.931*  PCO2ART  --  7.9*  PO2ART  --  145.0*    Recent Labs Lab 09/25/12 1750 09/25/12 1901  09/25/12 2131 09/25/12 2242 09/26/12 0003  GLUCAP >600* >600* >600* 585* 366*    CXR: CTA B  ASSESSMENT / PLAN:  PULMONARY A: Respiratory distress from metabolic acidosis > resolved P: -monitor O2 saturation  CARDIOVASCULAR A: Tachycardia from profound volume depletion from DKA > improving P:  -continue IVF -tele  RENAL A:  Hyperkalemia from metabolic acidosis from DKA Anion Gap Metabolic acidosis from DKA P:   -no K in IVF for now -repeat BMET  GASTROINTESTINAL A:  Nausea/vomiting P:   -prn zofran -water OK, advance to clears  HEMATOLOGIC A:  No acute issues P:    INFECTIOUS A:  Leukocytosis, but no clear evidence of infection P:   -check blood cultures -monitor for fever -hold antibiotics unless clear source of infection  ENDOCRINE A:  DKA > trigger for gastroenteritis?   Glucose, HR, RR all improving with IVF and insulin P:   -continue DKA protocol -repeat BMET now -no need for repeat ABG as clinically improving -resume diet later today -diabetic educator later today  NEUROLOGIC A:  Acute encephalopathy in setting of DKA > resolved P:   -continue IVF, supportive care for DKA  TODAY'S SUMMARY:  DKA, improving with usual care; monitor for fever, sign of infection.   Lemmie Evens [facility] and [place] Pager: [phone]  09/26/2012, 3:12 AM

## 2012-09-26 NOTE — Progress Notes (Signed)
Pt tearful, states mom is coming to pick her up and she is leaving the hospital AMA.  Refuses VS checks and pt removed insulin from IV and demanded IV be removed.  Pt refuses care. Pt has been told that she is sick and will be much better if she can stay the night in the hospital.  She doesn't care wants to leave.  Dr. Elsworth Soho is aware and has spoke with pt.

## 2012-09-26 NOTE — Progress Notes (Signed)
AMA form signed and in pt chart 2045. Risk of leaving against medical advice explained at length to patient by myself and Dr Tonye Becket is still sure about leaving.  Pt exited the unit, stating "My ride is outside" after signing [place] form 2045.

## 2012-09-26 NOTE — Progress Notes (Signed)
eLink Physician-Brief Progress Note Patient Name: Ashley Freeman DOB: [birth year] MRN: [id number]  Date of Service  09/26/2012   HPI/Events of Note  Ag resolved   eICU Interventions  Dc bicarb gtt Lantus 15 U, ensure 2h overlap of insulin gtt Ok to resume diet replace K   Intervention Category Intermediate Interventions: Hyperglycemia - evaluation and treatment  Bryelle Spiewak V. 09/26/2012, 7:58 PM

## 2012-09-26 NOTE — Progress Notes (Signed)
[id number] Rosana Hoes, RN, BSN, CCM:  CHART REVIEWED AND UPDATED.  Next chart review due on [id number]. NO DISCHARGE NEEDS PRESENT AT THIS TIME. CASE MANAGEMENT [phone]

## 2012-09-26 NOTE — Progress Notes (Signed)
eLink Physician-Brief Progress Note Patient Name: Ashley Freeman DOB: 04-Oct-1990 MRN: [id number]  Date of Service  09/26/2012   HPI/Events of Note   Pt wants to go home  eICU Interventions  I spoke to her over the camera & explained to her the risks of going home AMA including that of worsening sugars, acidosis & even death. Pt appears withdrawn & unwilling to engage. Have asked Rn to call when mother arrives.   Intervention Category Intermediate Interventions: Communication with other healthcare providers and/or family  ALVA,RAKESH V. 09/26/2012, 8:50 PM

## 2012-09-26 NOTE — Progress Notes (Signed)
PULMONARY  / CRITICAL CARE MEDICINE  Name: Ashley Freeman MRN: [id number] DOB: 03/23/1991    ADMISSION DATE:  09/25/2012 CONSULTATION DATE:  09/25/2012  REFERRING MD :  Odette Horns PRIMARY SERVICE: PCCM  CHIEF COMPLAINT:  DKA  BRIEF PATIENT DESCRIPTION: 22 y/o female with DKA, confusion, nausea and vomiting was admitted from the [facility] ED on 09/26/2012.    SIGNIFICANT EVENTS / STUDIES:  5/30 Admission >>  LINES / TUBES:   CULTURES: 5/30 blood >>  ANTIBIOTICS:  SUBJECTIVE:  Sleepy but stable   VITAL SIGNS: Temp:  [97.8 F (36.6 C)-98.2 F (36.8 C)] 98.2 F (36.8 C) (05/30 0800) Pulse Rate:  [104-144] 104 (05/30 0935) Resp:  [19-41] 20 (05/30 0935) BP: (90-138)/(46-94) 90/50 mmHg (05/30 0935) SpO2:  [99 %-100 %] 99 % (05/30 0935) HEMODYNAMICS: room air   VENTILATOR SETTINGS:   INTAKE / OUTPUT: Intake/Output     05/29 0701 - 05/30 0700 05/30 0701 - 05/31 0700   I.V. 4154.2    Total Intake 4154.2     Urine 2740    Emesis/NG output 2    Total Output 2742     Net +1412.2            PHYSICAL EXAMINATION:  General:  Resting comfortably Neuro:  Awake, alert, CN II-XII; strength 5/5 all over HEENT:  NCAT, PERRL EOMi, MMM Cardiovascular:  Tachy, regular, no mgr Lungs:  CTA B Abdomen:  BS+, soft, nontender Musculoskeletal:  Normal bulk and tone Skin:  Dry, no rash  LABS:  Recent Labs Lab 09/25/12 2135 09/25/12 2320 09/26/12 0640 09/26/12 0912  HGB 15.9*  --  9.6*  --   WBC 22.8*  --  20.9*  --   PLT 392  --  262  --   NA 135  --  135 138  K 5.5*  --  3.3* 3.9  CL 94*  --  109 114*  CO2 <7*  --  11* 10*  GLUCOSE 688*  --  115* 113*  BUN 19  --  11 11  CREATININE 0.71  --  0.53 0.56  CALCIUM 10.6*  --  7.6* 7.6*  PHART  --  6.931*  --   --   PCO2ART  --  7.9*  --   --   PO2ART  --  145.0*  --   --     Recent Labs Lab 09/26/12 0634 09/26/12 0732 09/26/12 0825 09/26/12 0935 09/26/12 1019  GLUCAP 108* 99 134* 106* 113*    CXR: CTA  B  ASSESSMENT / PLAN:  PULMONARY A: Respiratory distress from metabolic acidosis > resolved P: -monitor O2 saturation  CARDIOVASCULAR A: Tachycardia from profound volume depletion from DKA > improving P:  -continue IVF -tele  RENAL A:   Hypokalemia -initially had hyperkalemia, resolved.  Anion Gap Metabolic acidosis from DKA; now has NAG metabolic acid-->suspect d/t Hyperchloremia  P:   -replace K -cont IV insulin -repeat BMET -add bicarb to to IVFs  GASTROINTESTINAL A:  Nausea/vomiting P:   -prn zofran -water OK, advance to clears  HEMATOLOGIC A:   Hemodilution w/ Normocytic/normochromic pseudo-anemia.  Initial reading represented hemoconcentration, hgb drift from 15.9-->9.6 d/t volume resuscitation and doubt bleeding.  P:  Check retic count  Trend cbc  INFECTIOUS A:  Leukocytosis, but no clear evidence of infection P:   -f/u blood cultures -monitor for fever -hold antibiotics unless clear source of infection  ENDOCRINE A:  DKA > trigger for gastroenteritis?   Glucose, HR, RR all improving with  IVF and insulin P:   -continue DKA protocol, d/c gtt after acidosis resolved.  -resume diet later today -diabetic educator later today  NEUROLOGIC A:  Acute encephalopathy in setting of DKA > resolved P:   -continue IVF, supportive care for DKA   TODAY'S SUMMARY:  DKA, improving with usual care; monitor for fever, sign of infection. Now w/ NAG acidosis in setting of hyperchloremia. Will replace bicarb.      STaff note: Saw patient. Agree with NP   Dr. Brand Males, M.D., [facility].C.P Pulmonary and Critical Care Medicine Staff Physician [place] [facility] Pager: [phone], If no answer or between  15:00h - 7:00h: call [phone]  09/26/2012 10:39 AM

## 2012-09-27 ENCOUNTER — Inpatient Hospital Stay (HOSPITAL_COMMUNITY)
Admission: EM | Admit: 2012-09-27 | Discharge: 2012-09-29 | DRG: 638 | Disposition: A | Discharge status: Home / Self Care | Payer: Medicaid Other | Attending: Internal Medicine | Admitting: Internal Medicine

## 2012-09-27 ENCOUNTER — Emergency Department (HOSPITAL_COMMUNITY): Payer: Medicaid Other

## 2012-09-27 ENCOUNTER — Encounter (HOSPITAL_COMMUNITY): Payer: Self-pay | Admitting: *Deleted

## 2012-09-27 DIAGNOSIS — E1110 Type 2 diabetes mellitus with ketoacidosis without coma: Secondary | ICD-10-CM

## 2012-09-27 DIAGNOSIS — Z9119 Patient's noncompliance with other medical treatment and regimen: Secondary | ICD-10-CM

## 2012-09-27 DIAGNOSIS — R739 Hyperglycemia, unspecified: Secondary | ICD-10-CM

## 2012-09-27 DIAGNOSIS — E1010 Type 1 diabetes mellitus with ketoacidosis without coma: Principal | ICD-10-CM | POA: Diagnosis present

## 2012-09-27 DIAGNOSIS — Z91199 Patient's noncompliance with other medical treatment and regimen due to unspecified reason: Secondary | ICD-10-CM

## 2012-09-27 DIAGNOSIS — D72829 Elevated white blood cell count, unspecified: Secondary | ICD-10-CM

## 2012-09-27 DIAGNOSIS — Z794 Long term (current) use of insulin: Secondary | ICD-10-CM

## 2012-09-27 DIAGNOSIS — N289 Disorder of kidney and ureter, unspecified: Secondary | ICD-10-CM

## 2012-09-27 DIAGNOSIS — E871 Hypo-osmolality and hyponatremia: Secondary | ICD-10-CM | POA: Diagnosis present

## 2012-09-27 DIAGNOSIS — E875 Hyperkalemia: Secondary | ICD-10-CM

## 2012-09-27 DIAGNOSIS — E109 Type 1 diabetes mellitus without complications: Secondary | ICD-10-CM

## 2012-09-27 DIAGNOSIS — D638 Anemia in other chronic diseases classified elsewhere: Secondary | ICD-10-CM | POA: Diagnosis present

## 2012-09-27 DIAGNOSIS — E872 Acidosis: Secondary | ICD-10-CM

## 2012-09-27 DIAGNOSIS — E873 Alkalosis: Secondary | ICD-10-CM

## 2012-09-27 DIAGNOSIS — E876 Hypokalemia: Secondary | ICD-10-CM

## 2012-09-27 DIAGNOSIS — S80861S Insect bite (nonvenomous), right lower leg, sequela: Secondary | ICD-10-CM

## 2012-09-27 DIAGNOSIS — D649 Anemia, unspecified: Secondary | ICD-10-CM

## 2012-09-27 LAB — COMPREHENSIVE METABOLIC PANEL
AST: 54 U/L — ABNORMAL HIGH (ref 0–37)
BUN: 13 mg/dL (ref 6–23)
CO2: <7 mEq/L — CL (ref 19–32)
Calcium: 9.9 mg/dL (ref 8.4–10.5)
Chloride: 96 mEq/L (ref 96–112)
Creatinine, Ser: 0.84 mg/dL (ref 0.50–1.10)
GFR calc Af Amer: >90 mL/min (ref >90)
GFR calc non Af Amer: >90 mL/min (ref >90)
Glucose, Bld: 470 mg/dL — ABNORMAL HIGH (ref 70–99)
Total Bilirubin: 0.2 mg/dL — ABNORMAL LOW (ref 0.3–1.2)
Total Protein: 8.5 g/dL — ABNORMAL HIGH (ref 6.0–8.3)

## 2012-09-27 LAB — GLUCOSE, CAPILLARY
Glucose-Capillary: 315 mg/dL — ABNORMAL HIGH (ref 70–99)
Glucose-Capillary: 179 mg/dL — ABNORMAL HIGH (ref 70–99)
Glucose-Capillary: 127 mg/dL — ABNORMAL HIGH (ref 70–99)
Glucose-Capillary: 123 mg/dL — ABNORMAL HIGH (ref 70–99)
Glucose-Capillary: 135 mg/dL — ABNORMAL HIGH (ref 70–99)
Glucose-Capillary: 158 mg/dL — ABNORMAL HIGH (ref 70–99)
Glucose-Capillary: 205 mg/dL — ABNORMAL HIGH (ref 70–99)

## 2012-09-27 LAB — URINALYSIS, ROUTINE W REFLEX MICROSCOPIC
Glucose, UA: >1000 mg/dL — AB
Nitrite: NEGATIVE
Protein, ur: NEGATIVE mg/dL
Specific Gravity, Urine: 1.026 (ref 1.005–1.030)
Urobilinogen, UA: 0.2 mg/dL (ref 0.0–1.0)

## 2012-09-27 LAB — CBC WITH DIFFERENTIAL/PLATELET
Basophils Absolute: 0 10*3/uL (ref 0.0–0.1)
Eosinophils Absolute: 0 10*3/uL (ref 0.0–0.7)
Eosinophils Relative: 0 % (ref 0–5)
HCT: 40.8 % (ref 36.0–46.0)
Hemoglobin: 13.7 g/dL (ref 12.0–15.0)
Lymphocytes Relative: 17 % (ref 12–46)
Lymphs Abs: 2.1 10*3/uL (ref 0.7–4.0)
MCV: 85.4 fL (ref 78.0–100.0)
Monocytes Absolute: 0.4 10*3/uL (ref 0.1–1.0)
Monocytes Relative: 3 % (ref 3–12)
Neutro Abs: 9.3 10*3/uL — ABNORMAL HIGH (ref 1.7–7.7)
RBC: 4.78 MIL/uL (ref 3.87–5.11)
RDW: 13.7 % (ref 11.5–15.5)
WBC: 11.8 10*3/uL — ABNORMAL HIGH (ref 4.0–10.5)

## 2012-09-27 LAB — URINE MICROSCOPIC-ADD ON

## 2012-09-27 LAB — RAPID URINE DRUG SCREEN, HOSP PERFORMED
Amphetamines: NOT DETECTED
Barbiturates: NOT DETECTED
Benzodiazepines: NOT DETECTED
Opiates: NOT DETECTED

## 2012-09-27 LAB — BASIC METABOLIC PANEL
BUN: 8 mg/dL (ref 6–23)
BUN: 7 mg/dL (ref 6–23)
BUN: 5 mg/dL — ABNORMAL LOW (ref 6–23)
BUN: 4 mg/dL — ABNORMAL LOW (ref 6–23)
CO2: 7 mEq/L — CL (ref 19–32)
CO2: 12 mEq/L — ABNORMAL LOW (ref 19–32)
CO2: 9 mEq/L — CL (ref 19–32)
CO2: 11 mEq/L — ABNORMAL LOW (ref 19–32)
CO2: 10 mEq/L — CL (ref 19–32)
Calcium: 7.8 mg/dL — ABNORMAL LOW (ref 8.4–10.5)
Calcium: 8.2 mg/dL — ABNORMAL LOW (ref 8.4–10.5)
Calcium: 9.1 mg/dL (ref 8.4–10.5)
Chloride: 107 mEq/L (ref 96–112)
Chloride: 108 mEq/L (ref 96–112)
Chloride: 104 mEq/L (ref 96–112)
Chloride: 101 mEq/L (ref 96–112)
Creatinine, Ser: 0.58 mg/dL (ref 0.50–1.10)
Creatinine, Ser: 0.54 mg/dL (ref 0.50–1.10)
Creatinine, Ser: 0.49 mg/dL — ABNORMAL LOW (ref 0.50–1.10)
GFR calc Af Amer: >90 mL/min (ref >90)
GFR calc Af Amer: >90 mL/min (ref >90)
GFR calc Af Amer: >90 mL/min (ref >90)
GFR calc non Af Amer: >90 mL/min (ref >90)
Glucose, Bld: 175 mg/dL — ABNORMAL HIGH (ref 70–99)
Glucose, Bld: 164 mg/dL — ABNORMAL HIGH (ref 70–99)
Glucose, Bld: 166 mg/dL — ABNORMAL HIGH (ref 70–99)
Glucose, Bld: 167 mg/dL — ABNORMAL HIGH (ref 70–99)
Glucose, Bld: 198 mg/dL — ABNORMAL HIGH (ref 70–99)
Potassium: 3.9 mEq/L (ref 3.5–5.1)
Potassium: 5.2 mEq/L — ABNORMAL HIGH (ref 3.5–5.1)
Potassium: 3.9 mEq/L (ref 3.5–5.1)
Sodium: 135 mEq/L (ref 135–145)
Sodium: 132 mEq/L — ABNORMAL LOW (ref 135–145)

## 2012-09-27 LAB — CBC
MCH: 28 pg (ref 26.0–34.0)
MCHC: 32.9 g/dL (ref 30.0–36.0)
MCV: 85.1 fL (ref 78.0–100.0)
Platelets: 283 10*3/uL (ref 150–400)
RBC: 3.89 MIL/uL (ref 3.87–5.11)
RDW: 13.7 % (ref 11.5–15.5)

## 2012-09-27 LAB — BLOOD GAS, VENOUS
Bicarbonate: 7.4 mEq/L — ABNORMAL LOW (ref 20.0–24.0)
FIO2: 0.21 %
O2 Saturation: 17.5 %
Patient temperature: 98.6
TCO2: 7.2 mmol/L (ref 0–100)
pH, Ven: 7.19 — CL (ref 7.250–7.300)

## 2012-09-27 LAB — POCT PREGNANCY, URINE: Preg Test, Ur: NEGATIVE

## 2012-09-27 LAB — LACTIC ACID, PLASMA: Lactic Acid, Venous: 1.3 mmol/L (ref 0.5–2.2)

## 2012-09-27 MED ORDER — DEXTROSE 50 % IV SOLN: 25.0000 mL | INTRAVENOUS | Status: DC | PRN

## 2012-09-27 MED ORDER — DEXTROSE-NACL 5-0.45 % IV SOLN
INTRAVENOUS | Status: DC
  Administered 2012-09-27: 23:00:00 via INTRAVENOUS
  Administered 2012-09-28: 1000 mL via INTRAVENOUS
  Administered 2012-09-28: 19:00:00 via INTRAVENOUS

## 2012-09-27 MED ORDER — SODIUM CHLORIDE 0.9 % IV SOLN: INTRAVENOUS | Status: DC

## 2012-09-27 MED ORDER — POTASSIUM CHLORIDE 10 MEQ/100ML IV SOLN
10.0000 meq | INTRAVENOUS | Status: AC
  Administered 2012-09-27 (×4): 10 meq via INTRAVENOUS

## 2012-09-27 MED ORDER — POTASSIUM CHLORIDE 10 MEQ/100ML IV SOLN
INTRAVENOUS | Status: AC
  Filled 2012-09-27: qty 400

## 2012-09-27 MED ORDER — HYDROMORPHONE HCL PF 1 MG/ML IJ SOLN
1.0000 mg | INTRAMUSCULAR | Status: DC | PRN
  Administered 2012-09-27: 1 mg via INTRAVENOUS
  Filled 2012-09-27: qty 1

## 2012-09-27 MED ORDER — SODIUM CHLORIDE 0.9 % IV BOLUS (SEPSIS)
1000.0000 mL | Freq: Once | INTRAVENOUS | Status: AC
  Administered 2012-09-27: 1000 mL via INTRAVENOUS

## 2012-09-27 MED ORDER — DIPHENHYDRAMINE HCL 50 MG/ML IJ SOLN
25.0000 mg | INTRAMUSCULAR | Status: DC | PRN
  Administered 2012-09-27: 25 mg via INTRAVENOUS
  Filled 2012-09-27: qty 1

## 2012-09-27 MED ORDER — SODIUM CHLORIDE 0.9 % IV SOLN
INTRAVENOUS | Status: DC
  Administered 2012-09-27: 100 mL via INTRAVENOUS
  Administered 2012-09-27: 1000 mL via INTRAVENOUS

## 2012-09-27 MED ORDER — HYDROMORPHONE HCL PF 1 MG/ML IJ SOLN
1.0000 mg | INTRAMUSCULAR | Status: DC | PRN
  Administered 2012-09-27 – 2012-09-28 (×5): 1 mg via INTRAVENOUS
  Filled 2012-09-27 (×5): qty 1

## 2012-09-27 MED ORDER — INSULIN REGULAR BOLUS VIA INFUSION
0.0000 [IU] | Freq: Three times a day (TID) | INTRAVENOUS | Status: DC
  Filled 2012-09-27: qty 10

## 2012-09-27 MED ORDER — ONDANSETRON HCL 4 MG/2ML IJ SOLN
4.0000 mg | Freq: Four times a day (QID) | INTRAMUSCULAR | Status: DC | PRN
  Administered 2012-09-27: 4 mg via INTRAVENOUS
  Filled 2012-09-27: qty 2

## 2012-09-27 MED ORDER — DEXTROSE-NACL 5-0.45 % IV SOLN: INTRAVENOUS | Status: DC

## 2012-09-27 MED ORDER — ENOXAPARIN SODIUM 30 MG/0.3ML ~~LOC~~ SOLN
30.0000 mg | SUBCUTANEOUS | Status: DC
  Administered 2012-09-27 – 2012-09-29 (×3): 30 mg via SUBCUTANEOUS
  Filled 2012-09-27 (×3): qty 0.3

## 2012-09-27 MED ORDER — SODIUM CHLORIDE 0.9 % IV SOLN
INTRAVENOUS | Status: DC
  Administered 2012-09-27: 11:00:00 via INTRAVENOUS

## 2012-09-27 MED ORDER — SODIUM CHLORIDE 0.9 % IV SOLN
INTRAVENOUS | Status: DC
  Administered 2012-09-27: 12:00:00 via INTRAVENOUS
  Filled 2012-09-27: qty 1

## 2012-09-27 NOTE — ED Notes (Addendum)
Pt seen yesterday and told she was in DKA, left AMA, today feels worse, hurting in chest area CBG 445, pt with labored rest. Requesting to use bathroom during triage, stable with ambulation.

## 2012-09-27 NOTE — H&P (Signed)
[facility] History and Physical  Ashley Freeman [id number] DOB: 09-20-1990 DOA: 09/27/2012  Referring physician: ED physician PCP: Cresenciano Genre, MD   Chief Complaint: Generalized weakness, nausea  HPI:  Pt is 22 yo female with history of uncontrolled diabetes, insulin dependent, who left against medical advice one day prior to this admission and now comes back with main concern of progressively worsening weakness, malaise, nausea and poor oral intake. She explains she has not taken any insulin once she left the hospital and has not checked her sugar levels. She is rather poor historian and does not offer lots of details. She denies fevers, chills, no specific abdominal or urinary concerns.   In ED, pt with severe DKA, Bicarb 7, TR[facility] asked to admit to SDU for further evaluation.   Assessment and Plan:  DKA  - secondary to medical non compliance - will need to admit to step down unit and start on glucomander protocol, insulin drop  - will provided additional 2 L boluses of NS, obtain BMET Q2 hours to follow up on anion gap - check UDS - follow up on ABG - will need extensive education and counseling of compliance  - supplement potassium via IV - keep NPO  Code Status: Full Family Communication: Pt at bedside Disposition Plan: Admit to stepdown unit    Review of Systems:  Unable to provide, says she is too tired     Past Medical History  Diagnosis Date  . Gonorrhea 08/2011    Treated in 09/2011  . Diabetes mellitus 2001    Diagnosed at age 53 ; Type I    Past Surgical History  Procedure Laterality Date  . No past surgeries      Social History:  reports that she has never smoked. She has never used smokeless tobacco. She reports that she does not drink alcohol or use illicit drugs.  Allergies  Allergen Reactions  . Penicillins Hives    Family History  Problem Relation Age of Onset  . Anesthesia problems Neg Hx   . Asthma Mother   . Gout Father    . Diabetes Paternal Grandmother     Prior to Admission medications   Medication Sig Start Date End Date Taking? Authorizing Provider  insulin aspart protamine- aspart (NOVOLOG MIX 70/30 FLEXPEN) (70-30) 100 UNIT/ML injection Inject 18 Units into the skin 2 (two) times daily with a meal.   Yes Historical Provider, MD    Physical Exam: Filed Vitals:   09/27/12 1012  BP: 131/92  Pulse: 106  Temp: 97.8 F (36.6 C)  TempSrc: Oral  Resp: 31  SpO2: 100%    Physical Exam  Constitutional: Appears somnolent but not in acute distress HENT: Normocephalic. External right and left ear normal. Dry MM Eyes: Conjunctivae and EOM are normal. PERRLA, no scleral icterus.  Neck: Normal ROM. Neck supple. No JVD. No tracheal deviation. No thyromegaly.  CVS: Regular rhythm, tachycardic, S1/S2 +, no murmurs, no gallops, no carotid bruit.  Pulmonary: Effort and breath sounds normal, no stridor, rhonchi, wheezes, rales.  Abdominal: Soft. BS +,  no distension, tenderness, rebound or guarding.  Musculoskeletal: Normal range of motion. No edema and no tenderness.  Lymphadenopathy: No lymphadenopathy noted, cervical, inguinal. Neuro: Alert. Normal reflexes, muscle tone coordination. No cranial nerve deficit. Skin: Skin is warm and dry. No rash noted. Not diaphoretic. No erythema. No pallor.  Psychiatric: Normal mood and affect. Behavior, judgment, thought content normal.   Labs on Admission:  Basic Metabolic Panel:  Recent  Labs Lab 09/25/12 2135 09/26/12 0640 09/26/12 0912 09/26/12 1430 09/26/12 1830  NA 135 135 138 134* 135  K 5.5* 3.3* 3.9 3.4* 3.0*  CL 94* 109 114* 105 104  CO2 <7* 11* 10* 15* 19  GLUCOSE 688* 115* 113* 187* 199*  BUN 19 11 11 10 9   CREATININE 0.71 0.53 0.56 0.56 0.57  CALCIUM 10.6* 7.6* 7.6* 8.4 8.2*   Liver Function Tests: No results found for this basename: AST, ALT, ALKPHOS, BILITOT, PROT, ALBUMIN,  in the last 168 hours No results found for this basename: LIPASE,  AMYLASE,  in the last 168 hours No results found for this basename: AMMONIA,  in the last 168 hours CBC:  Recent Labs Lab 09/25/12 2135 09/26/12 0640 09/27/12 1027  WBC 22.8* 20.9* 11.8*  NEUTROABS  --   --  9.3*  HGB 15.9* 9.6* 13.7  HCT 48.7* 28.9* 40.8  MCV 88.7 83.0 85.4  PLT 392 262 323   CBG:  Recent Labs Lab 09/26/12 1525 09/26/12 1627 09/26/12 1724 09/26/12 1829 09/27/12 0949  GLUCAP 122* 129* 182* 184* 445*    Radiological Exams on Admission: Dg Chest 2 View  09/27/2012   *RADIOLOGY REPORT*  Clinical Data: Chest pain.  Weakness.  CHEST - 2 VIEW  Comparison: 09/25/2012 to  Findings: The heart, mediastinum and hila are normal.  The lungs are clear.  No pleural effusion or pneumothorax.  Mild pectus excavatum deformity.  The bony thorax is otherwise unremarkable.  IMPRESSION: No active disease of the chest.   Original Report Authenticated By: Lajean Manes, M.D.   Dg Chest Port 1 View  09/25/2012   *RADIOLOGY REPORT*  Clinical Data: Severe shortness of breath.  PORTABLE CHEST - 1 VIEW  Comparison: Chest radiograph performed 05/06/2012  Findings: The lungs are relatively well-aerated and clear.  There is no evidence of focal opacification, pleural effusion or pneumothorax.  The cardiomediastinal silhouette is within normal limits.  No acute osseous abnormalities are seen.  IMPRESSION: No acute cardiopulmonary process seen.   Original Report Authenticated By: Santa Lighter, M.D.    EKG: Normal sinus rhythm, no ST/T wave changes  Faye Ramsay, MD  [facility] Pager [phone]  If 7PM-7AM, please contact night-coverage [email or web address] Password [facility] 09/27/2012, 11:49 AM

## 2012-09-27 NOTE — Progress Notes (Signed)
Gershon Mussel, NP called d/t pt lab values.  Pt potassium infusion stopped per orders. Will recheck BMET at scheduled time, and F/U. Dyann Ruddle, RN

## 2012-09-27 NOTE — ED Provider Notes (Signed)
History     CSN: [id number]  Arrival date & time 09/27/12  [id number]   First MD Initiated Contact with Patient 09/27/12 1002      Chief Complaint  Patient presents with  . Hyperglycemia    (Consider location/radiation/quality/duration/timing/severity/associated sxs/prior treatment) Patient is a 22 y.o. female presenting with hyperglycemia. The history is provided by the patient.  Hyperglycemia  patient here after becoming short of breath after finding about the death of a friend. She also notes polyuria and polydipsia. Seen by myself 2 days ago for severe DKA in hospitalized to ICU. According to the old records, patient left AMA last night about 9:00. Seek continued to feel weak and lightheaded. No vomiting or diarrhea. No cough or anginal type chest pain. Symptoms persisted and nothing makes them better worse. No insulin use prior to arrival  Past Medical History  Diagnosis Date  . Gonorrhea 08/2011    Treated in 09/2011  . Diabetes mellitus 2001    Diagnosed at age 19 ; Type I    Past Surgical History  Procedure Laterality Date  . No past surgeries      Family History  Problem Relation Age of Onset  . Anesthesia problems Neg Hx   . Asthma Mother   . Gout Father   . Diabetes Paternal Grandmother     History  Substance Use Topics  . Smoking status: Never Smoker   . Smokeless tobacco: Never Used  . Alcohol Use: No    OB History   Grav Para Term Preterm Abortions TAB SAB Ect Mult Living   1 0   1  1   0      Review of Systems  All other systems reviewed and are negative.    Allergies  Penicillins  Home Medications   Current Outpatient Rx  Name  Route  Sig  Dispense  Refill  . ibuprofen (ADVIL,MOTRIN) 200 MG tablet   Oral   Take 600 mg by mouth every 8 (eight) hours as needed for pain.         [name] insulin aspart protamine- aspart (NOVOLOG MIX 70/30 FLEXPEN) (70-30) 100 UNIT/ML injection   Subcutaneous   Inject 18 Units into the skin 2 (two) times daily  with a meal.           LMP 09/25/2012  Physical Exam  Nursing note and vitals reviewed. Constitutional: She is oriented to person, place, and time. She appears well-developed and well-nourished.  Non-toxic appearance. No distress.  HENT:  Head: Normocephalic and atraumatic.  Eyes: Conjunctivae, EOM and lids are normal. Pupils are equal, round, and reactive to light.  Neck: Normal range of motion. Neck supple. No tracheal deviation present. No mass present.  Cardiovascular: Regular rhythm and normal heart sounds.  Bradycardia present.  Exam reveals no gallop.   No murmur heard. Pulmonary/Chest: Effort normal and breath sounds normal. No stridor. No respiratory distress. She has no decreased breath sounds. She has no wheezes. She has no rhonchi. She has no rales.  Abdominal: Soft. Normal appearance and bowel sounds are normal. She exhibits no distension. There is no tenderness. There is no rebound and no CVA tenderness.  Musculoskeletal: Normal range of motion. She exhibits no edema and no tenderness.  Neurological: She is alert and oriented to person, place, and time. She has normal strength. No cranial nerve deficit or sensory deficit. GCS eye subscore is 4. GCS verbal subscore is 5. GCS motor subscore is 6.  Skin: Skin is warm and dry. No  abrasion and no rash noted.  Psychiatric: She has a normal mood and affect. Her speech is normal and behavior is normal.    ED Course  Procedures (including critical care time)  Labs Reviewed  GLUCOSE, CAPILLARY - Abnormal; Notable for the following:    Glucose-Capillary 445 (*)    All other components within normal limits  CBC WITH DIFFERENTIAL  COMPREHENSIVE METABOLIC PANEL  URINALYSIS, ROUTINE W REFLEX MICROSCOPIC  BLOOD GAS, VENOUS  BLOOD GAS, VENOUS   Dg Chest Port 1 View  09/25/2012   *RADIOLOGY REPORT*  Clinical Data: Severe shortness of breath.  PORTABLE CHEST - 1 VIEW  Comparison: Chest radiograph performed 05/06/2012  Findings: The  lungs are relatively well-aerated and clear.  There is no evidence of focal opacification, pleural effusion or pneumothorax.  The cardiomediastinal silhouette is within normal limits.  No acute osseous abnormalities are seen.  IMPRESSION: No acute cardiopulmonary process seen.   Original Report Authenticated By: Santa Lighter, M.D.     No diagnosis found.    MDM   Date: 09/27/2012  Rate: 105  Rhythm: sinus tachycardia  QRS Axis: normal  Intervals: normal  ST/T Wave abnormalities: normal  Conduction Disutrbances:none  Narrative Interpretation:   Old EKG Reviewed: none available  11:11 AM Patient given iv fluids and started on an insulin drip--old records reviewed from prior admission--pt rechecked multiple times, will be admitted  CRITICAL CARE Performed by: Leota Jacobsen Total critical care time: 40 Critical care time was exclusive of separately billable procedures and treating other patients. Critical care was necessary to treat or prevent imminent or life-threatening deterioration. Critical care was time spent personally by me on the following activities: development of treatment plan with patient and/or surrogate as well as nursing, discussions with consultants, evaluation of patient's response to treatment, examination of patient, obtaining history from patient or surrogate, ordering and performing treatments and interventions, ordering and review of laboratory studies, ordering and review of radiographic studies, pulse oximetry and re-evaluation of patient's condition.         Leota Jacobsen, MD 09/27/12 1113

## 2012-09-28 DIAGNOSIS — D649 Anemia, unspecified: Secondary | ICD-10-CM

## 2012-09-28 DIAGNOSIS — E109 Type 1 diabetes mellitus without complications: Secondary | ICD-10-CM

## 2012-09-28 LAB — BASIC METABOLIC PANEL
BUN: 3 mg/dL — ABNORMAL LOW (ref 6–23)
BUN: 3 mg/dL — ABNORMAL LOW (ref 6–23)
BUN: <3 mg/dL — ABNORMAL LOW (ref 6–23)
BUN: <3 mg/dL — ABNORMAL LOW (ref 6–23)
BUN: <3 mg/dL — ABNORMAL LOW (ref 6–23)
BUN: <3 mg/dL — ABNORMAL LOW (ref 6–23)
BUN: <3 mg/dL — ABNORMAL LOW (ref 6–23)
BUN: <3 mg/dL — ABNORMAL LOW (ref 6–23)
CO2: 12 mEq/L — ABNORMAL LOW (ref 19–32)
CO2: 15 mEq/L — ABNORMAL LOW (ref 19–32)
CO2: 11 mEq/L — ABNORMAL LOW (ref 19–32)
CO2: 14 mEq/L — ABNORMAL LOW (ref 19–32)
CO2: 14 mEq/L — ABNORMAL LOW (ref 19–32)
CO2: 14 mEq/L — ABNORMAL LOW (ref 19–32)
CO2: 14 mEq/L — ABNORMAL LOW (ref 19–32)
CO2: 19 mEq/L (ref 19–32)
CO2: 14 mEq/L — ABNORMAL LOW (ref 19–32)
CO2: 17 mEq/L — ABNORMAL LOW (ref 19–32)
Calcium: 8.5 mg/dL (ref 8.4–10.5)
Calcium: 8.6 mg/dL (ref 8.4–10.5)
Calcium: 8.6 mg/dL (ref 8.4–10.5)
Calcium: 8.7 mg/dL (ref 8.4–10.5)
Calcium: 8.7 mg/dL (ref 8.4–10.5)
Calcium: 9.3 mg/dL (ref 8.4–10.5)
Calcium: 9.1 mg/dL (ref 8.4–10.5)
Chloride: 104 mEq/L (ref 96–112)
Chloride: 104 mEq/L (ref 96–112)
Chloride: 106 mEq/L (ref 96–112)
Chloride: 101 mEq/L (ref 96–112)
Chloride: 101 mEq/L (ref 96–112)
Chloride: 100 mEq/L (ref 96–112)
Chloride: 104 mEq/L (ref 96–112)
Creatinine, Ser: 0.5 mg/dL (ref 0.50–1.10)
Creatinine, Ser: 0.5 mg/dL (ref 0.50–1.10)
Creatinine, Ser: 0.51 mg/dL (ref 0.50–1.10)
Creatinine, Ser: 0.47 mg/dL — ABNORMAL LOW (ref 0.50–1.10)
Creatinine, Ser: 0.45 mg/dL — ABNORMAL LOW (ref 0.50–1.10)
Creatinine, Ser: 0.41 mg/dL — ABNORMAL LOW (ref 0.50–1.10)
Creatinine, Ser: 0.44 mg/dL — ABNORMAL LOW (ref 0.50–1.10)
GFR calc Af Amer: >90 mL/min (ref >90)
GFR calc Af Amer: >90 mL/min (ref >90)
GFR calc Af Amer: >90 mL/min (ref >90)
GFR calc Af Amer: >90 mL/min (ref >90)
GFR calc non Af Amer: >90 mL/min (ref >90)
GFR calc non Af Amer: >90 mL/min (ref >90)
GFR calc non Af Amer: >90 mL/min (ref >90)
GFR calc non Af Amer: >90 mL/min (ref >90)
GFR calc non Af Amer: >90 mL/min (ref >90)
GFR calc non Af Amer: >90 mL/min (ref >90)
Glucose, Bld: 188 mg/dL — ABNORMAL HIGH (ref 70–99)
Glucose, Bld: 96 mg/dL (ref 70–99)
Glucose, Bld: 162 mg/dL — ABNORMAL HIGH (ref 70–99)
Glucose, Bld: 220 mg/dL — ABNORMAL HIGH (ref 70–99)
Glucose, Bld: 146 mg/dL — ABNORMAL HIGH (ref 70–99)
Glucose, Bld: 122 mg/dL — ABNORMAL HIGH (ref 70–99)
Glucose, Bld: 187 mg/dL — ABNORMAL HIGH (ref 70–99)
Glucose, Bld: 146 mg/dL — ABNORMAL HIGH (ref 70–99)
Glucose, Bld: 244 mg/dL — ABNORMAL HIGH (ref 70–99)
Potassium: 3.4 mEq/L — ABNORMAL LOW (ref 3.5–5.1)
Potassium: 3.4 mEq/L — ABNORMAL LOW (ref 3.5–5.1)
Potassium: 3.8 mEq/L (ref 3.5–5.1)
Potassium: 3.9 mEq/L (ref 3.5–5.1)
Potassium: 3.8 mEq/L (ref 3.5–5.1)
Sodium: 133 mEq/L — ABNORMAL LOW (ref 135–145)
Sodium: 133 mEq/L — ABNORMAL LOW (ref 135–145)
Sodium: 130 mEq/L — ABNORMAL LOW (ref 135–145)
Sodium: 130 mEq/L — ABNORMAL LOW (ref 135–145)

## 2012-09-28 LAB — GLUCOSE, CAPILLARY
Glucose-Capillary: 111 mg/dL — ABNORMAL HIGH (ref 70–99)
Glucose-Capillary: 114 mg/dL — ABNORMAL HIGH (ref 70–99)
Glucose-Capillary: 115 mg/dL — ABNORMAL HIGH (ref 70–99)
Glucose-Capillary: 130 mg/dL — ABNORMAL HIGH (ref 70–99)
Glucose-Capillary: 141 mg/dL — ABNORMAL HIGH (ref 70–99)
Glucose-Capillary: 155 mg/dL — ABNORMAL HIGH (ref 70–99)
Glucose-Capillary: 175 mg/dL — ABNORMAL HIGH (ref 70–99)
Glucose-Capillary: 175 mg/dL — ABNORMAL HIGH (ref 70–99)
Glucose-Capillary: 189 mg/dL — ABNORMAL HIGH (ref 70–99)
Glucose-Capillary: 190 mg/dL — ABNORMAL HIGH (ref 70–99)
Glucose-Capillary: 208 mg/dL — ABNORMAL HIGH (ref 70–99)
Glucose-Capillary: 211 mg/dL — ABNORMAL HIGH (ref 70–99)
Glucose-Capillary: 97 mg/dL (ref 70–99)

## 2012-09-28 LAB — TROPONIN I: Troponin I: <0.3 ng/mL (ref <0.30)

## 2012-09-28 MED ORDER — POTASSIUM CHLORIDE 10 MEQ/100ML IV SOLN
10.0000 meq | INTRAVENOUS | Status: AC
  Administered 2012-09-28 (×2): 10 meq via INTRAVENOUS
  Filled 2012-09-28: qty 100

## 2012-09-28 MED ORDER — DEXTROSE-NACL 5-0.45 % IV SOLN: INTRAVENOUS | Status: DC

## 2012-09-28 MED ORDER — BIOTENE DRY MOUTH MT LIQD
15.0000 mL | Freq: Two times a day (BID) | OROMUCOSAL | Status: DC
  Administered 2012-09-28 – 2012-09-29 (×3): 15 mL via OROMUCOSAL

## 2012-09-28 MED ORDER — SODIUM BICARBONATE 8.4 % IV SOLN
INTRAVENOUS | Status: DC
  Administered 2012-09-28: 18:00:00 via INTRAVENOUS
  Filled 2012-09-28: qty 150

## 2012-09-28 MED ORDER — CHLORHEXIDINE GLUCONATE 0.12 % MT SOLN
15.0000 mL | Freq: Two times a day (BID) | OROMUCOSAL | Status: DC
  Administered 2012-09-28 – 2012-09-29 (×3): 15 mL via OROMUCOSAL
  Filled 2012-09-28 (×5): qty 15

## 2012-09-28 MED ORDER — POTASSIUM CHLORIDE 10 MEQ/100ML IV SOLN: 10.0000 meq | INTRAVENOUS | Status: DC

## 2012-09-28 MED ORDER — POTASSIUM CHLORIDE 10 MEQ/100ML IV SOLN
10.0000 meq | INTRAVENOUS | Status: AC
  Administered 2012-09-28 (×4): 10 meq via INTRAVENOUS
  Filled 2012-09-28: qty 400

## 2012-09-28 MED ORDER — HYDROMORPHONE HCL PF 1 MG/ML IJ SOLN
0.5000 mg | INTRAMUSCULAR | Status: DC | PRN
  Administered 2012-09-29: 1 mg via INTRAVENOUS
  Administered 2012-09-29: 0.5 mg via INTRAVENOUS
  Administered 2012-09-29: 1 mg via INTRAVENOUS
  Filled 2012-09-28 (×5): qty 1

## 2012-09-28 MED ORDER — POTASSIUM CHLORIDE 10 MEQ/100ML IV SOLN
INTRAVENOUS | Status: AC
  Filled 2012-09-28: qty 100

## 2012-09-28 MED ORDER — POTASSIUM CHLORIDE 10 MEQ/100ML IV SOLN
10.0000 meq | Freq: Once | INTRAVENOUS | Status: AC
  Administered 2012-09-28: 10 meq via INTRAVENOUS

## 2012-09-28 MED ORDER — POTASSIUM CHLORIDE 10 MEQ/100ML IV SOLN
10.0000 meq | INTRAVENOUS | Status: AC
  Administered 2012-09-28 (×2): 10 meq via INTRAVENOUS
  Filled 2012-09-28 (×2): qty 100

## 2012-09-28 NOTE — Progress Notes (Signed)
Patient ID: Ashley Freeman, female   DOB: 02-02-1991, 22 y.o.   MRN: [id number]  [facility] PROGRESS NOTE  [place] Penick [facility] [id number] DOB: 16-Apr-1991 DOA: 09/27/2012 PCP: Cresenciano Genre, MD  Brief narrative: Pt is 22 yo female with history of uncontrolled diabetes, insulin dependent, who left against medical advice one day prior to this admission and now comes back with main concern of progressively worsening weakness, malaise, nausea and poor oral intake. She explains she has not taken any insulin once she left the hospital and has not checked her sugar levels. She is rather poor historian and does not offer lots of details. She denies fevers, chills, no specific abdominal or urinary concerns.  In ED, pt with severe DKA, Bicarb 7, [facility] asked to admit to SDU for further evaluation.   Assessment and Plan:   DKA  - secondary to medical non compliance  - pt is clinically improving, will continue to monitor in SDU - continue to keep on insulin drip for now, IVF, keep NPO - will check BMP Q2 hours until anion gap closes   - will need extensive education and counseling of compliance  - supplement potassium via IV  Anemia of chronic disease - HG and Hct stable at pt's baseline, CBC in AM Leukocytosis - likely reactive, no signs of infectious etiology evident Hyponatremia - mild and pre renal secondary to dehydration, continue IVF - repeat BMP in AM   Consultants:  None  Procedures/Studies: Dg Chest 2 View 09/27/2012  No active disease of the chest.    Antibiotics:  None  Code Status: Full Family Communication: Pt at bedside Disposition Plan: Home when medically stable  HPI/Subjective: No events overnight.   Objective: Filed Vitals:   09/28/12 0400 09/28/12 0700 09/28/12 0800 09/28/12 1150  BP: 125/73 144/91  121/73  Pulse: 100  101   Temp: 99 F (37.2 C)  99.6 F (37.6 C) 98.2 F (36.8 C)  TempSrc: Oral  Oral Oral  Resp: 23  22   Height:       Weight:      SpO2: 99%  99%     Intake/Output Summary (Last 24 hours) at 09/28/12 1221 Last data filed at 09/28/12 1147  Gross per 24 hour  Intake 6050.24 ml  Output   2450 ml  Net 3600.24 ml    Exam:   General:  Pt is alert, follows commands appropriately, not in acute distress  Cardiovascular: Regular rate and rhythm, S1/S2, no murmurs, no rubs, no gallops  Respiratory: Clear to auscultation bilaterally, no wheezing, no crackles, no rhonchi  Abdomen: Soft, non tender, non distended, bowel sounds present, no guarding  Extremities: No edema, pulses DP and PT palpable bilaterally  Neuro: Grossly nonfocal  Data Reviewed: Basic Metabolic Panel:  Recent Labs Lab 09/28/12 0115 09/28/12 0335 09/28/12 0520 09/28/12 0909 09/28/12 1111  NA 133* 134* 133* 130* 131*  K 3.5 3.4* 3.4* 3.8 3.9  CL 105 107 104 102 104  CO2 12* 15* 11* 14* 14*  GLUCOSE 188* 96 162* 220* 146*  BUN 3* 3* 3* <3* <3*  CREATININE 0.50 0.50 0.49* 0.51 0.49*  CALCIUM 8.5 8.6 8.6 8.7 8.6   Liver Function Tests:  Recent Labs Lab 09/27/12 1027  AST 54*  ALT 32  ALKPHOS 105  BILITOT 0.2*  PROT 8.5*  ALBUMIN 4.0   CBC:  Recent Labs Lab 09/25/12 2135 09/26/12 0640 09/27/12 1027 09/27/12 1354  WBC 22.8* 20.9* 11.8* 12.8*  NEUTROABS  --   --  9.3*  --   HGB 15.9* 9.6* 13.7 10.9*  HCT 48.7* 28.9* 40.8 33.1*  MCV 88.7 83.0 85.4 85.1  PLT 392 262 323 283   Cardiac Enzymes:  Recent Labs Lab 09/27/12 1355 09/27/12 1925 09/28/12 0115  TROPONINI <0.30 <0.30 <0.30   CBG:  Recent Labs Lab 09/28/12 0017 09/28/12 0116 09/28/12 0216 09/28/12 0316 09/28/12 0726  GLUCAP 208* 175* 132* 101* 173*    Recent Results (from the past 240 hour(s))  MRSA PCR SCREENING     Status: None   Collection Time    09/26/12  5:38 AM      Result Value Range Status   MRSA by PCR NEGATIVE  NEGATIVE Final   Comment:            The GeneXpert MRSA Assay (FDA     approved for NASAL specimens      only), is one component of a     comprehensive MRSA colonization     surveillance program. It is not     intended to diagnose MRSA     infection nor to guide or     monitor treatment for     MRSA infections.  CULTURE, BLOOD (ROUTINE X 2)     Status: None   Collection Time    09/26/12  6:15 AM      Result Value Range Status   Specimen Description BLOOD RIGHT HAND   Final   Special Requests BOTTLES DRAWN AEROBIC ONLY 1CC   Final   Culture  Setup Time 09/26/2012 08:15   Final   Culture     Final   Value:        BLOOD CULTURE RECEIVED NO GROWTH TO DATE CULTURE WILL BE HELD FOR 5 DAYS BEFORE ISSUING A FINAL NEGATIVE REPORT   Report Status PENDING   Incomplete  CULTURE, BLOOD (ROUTINE X 2)     Status: None   Collection Time    09/26/12  6:40 AM      Result Value Range Status   Specimen Description BLOOD RIGHT ARM   Final   Special Requests     Final   Value: BOTTLES DRAWN AEROBIC AND ANAEROBIC 2CC ANA 3CC AER   Culture  Setup Time 09/26/2012 08:15   Final   Culture     Final   Value:        BLOOD CULTURE RECEIVED NO GROWTH TO DATE CULTURE WILL BE HELD FOR 5 DAYS BEFORE ISSUING A FINAL NEGATIVE REPORT   Report Status PENDING   Incomplete     Scheduled Meds: . antiseptic oral rinse  15 mL Mouth Rinse q12n4p  . chlorhexidine  15 mL Mouth Rinse BID  . enoxaparin (LOVENOX) injection  30 mg Subcutaneous Q24H  . potassium chloride  10 mEq Intravenous Q1 Hr x 4   Continuous Infusions: . sodium chloride 1,000 mL (09/27/12 1420)  . dextrose 5 % and 0.45% NaCl 1,000 mL (09/28/12 1015)  . insulin (NOVOLIN-R) infusion 1.3 Units/hr (09/28/12 0840)    Faye Ramsay, MD  [facility] Pager [phone]  If 7PM-7AM, please contact night-coverage [email or web address] Password [email or web address] 09/28/2012, 12:21 PM   LOS: 1 day

## 2012-09-28 NOTE — Progress Notes (Signed)
Multiple conversations with Gershon Mussel, NP r/t p/t care during the night.  Pt C02 remains abnormal, MD to F/U.  Dyann Ruddle, RN

## 2012-09-28 NOTE — Progress Notes (Signed)
Spoke with diabetes coordinator about sodium bicarb being added to IVF for DKA.  Coordinator spoke to recommended no bicarb in IVF and continue with D5 1/2 NS at 100 cc/hr and insulin drip.  Paged Dr. Doyle Askew about recommendation at 1825.  To cover carbs when patient eating.  Shaden Higley Cheryln Manly

## 2012-09-28 NOTE — ED Provider Notes (Signed)
Medical screening examination/treatment/procedure(s) were performed by non-physician practitioner and as supervising physician I was immediately available for consultation/collaboration.  Leota Jacobsen, MD 09/28/12 [phone]

## 2012-09-29 LAB — BASIC METABOLIC PANEL
BUN: <3 mg/dL — ABNORMAL LOW (ref 6–23)
BUN: <3 mg/dL — ABNORMAL LOW (ref 6–23)
CO2: 18 mEq/L — ABNORMAL LOW (ref 19–32)
CO2: 17 mEq/L — ABNORMAL LOW (ref 19–32)
CO2: 21 mEq/L (ref 19–32)
Calcium: 9.3 mg/dL (ref 8.4–10.5)
Calcium: 9.1 mg/dL (ref 8.4–10.5)
Chloride: 103 mEq/L (ref 96–112)
Chloride: 101 mEq/L (ref 96–112)
Chloride: 105 mEq/L (ref 96–112)
Chloride: 103 mEq/L (ref 96–112)
Creatinine, Ser: 0.47 mg/dL — ABNORMAL LOW (ref 0.50–1.10)
Creatinine, Ser: 0.43 mg/dL — ABNORMAL LOW (ref 0.50–1.10)
Creatinine, Ser: 0.44 mg/dL — ABNORMAL LOW (ref 0.50–1.10)
GFR calc Af Amer: >90 mL/min (ref >90)
GFR calc Af Amer: >90 mL/min (ref >90)
GFR calc non Af Amer: >90 mL/min (ref >90)
Glucose, Bld: 91 mg/dL (ref 70–99)
Glucose, Bld: 210 mg/dL — ABNORMAL HIGH (ref 70–99)
Glucose, Bld: 270 mg/dL — ABNORMAL HIGH (ref 70–99)
Glucose, Bld: 107 mg/dL — ABNORMAL HIGH (ref 70–99)
Potassium: 3.7 mEq/L (ref 3.5–5.1)
Potassium: 3.5 mEq/L (ref 3.5–5.1)
Potassium: 3.5 mEq/L (ref 3.5–5.1)
Potassium: 3.6 mEq/L (ref 3.5–5.1)
Sodium: 136 mEq/L (ref 135–145)
Sodium: 132 mEq/L — ABNORMAL LOW (ref 135–145)
Sodium: 133 mEq/L — ABNORMAL LOW (ref 135–145)
Sodium: 137 mEq/L (ref 135–145)

## 2012-09-29 LAB — GLUCOSE, CAPILLARY
Glucose-Capillary: 129 mg/dL — ABNORMAL HIGH (ref 70–99)
Glucose-Capillary: 219 mg/dL — ABNORMAL HIGH (ref 70–99)
Glucose-Capillary: 120 mg/dL — ABNORMAL HIGH (ref 70–99)
Glucose-Capillary: 108 mg/dL — ABNORMAL HIGH (ref 70–99)
Glucose-Capillary: 162 mg/dL — ABNORMAL HIGH (ref 70–99)
Glucose-Capillary: 109 mg/dL — ABNORMAL HIGH (ref 70–99)
Glucose-Capillary: 97 mg/dL (ref 70–99)
Glucose-Capillary: 235 mg/dL — ABNORMAL HIGH (ref 70–99)
Glucose-Capillary: 271 mg/dL — ABNORMAL HIGH (ref 70–99)

## 2012-09-29 LAB — CBC
MCH: 27.2 pg (ref 26.0–34.0)
MCHC: 32.6 g/dL (ref 30.0–36.0)
MCV: 83.4 fL (ref 78.0–100.0)
Platelets: 243 10*3/uL (ref 150–400)
RBC: 3.79 MIL/uL — ABNORMAL LOW (ref 3.87–5.11)
RDW: 13.1 % (ref 11.5–15.5)

## 2012-09-29 MED ORDER — INSULIN ASPART 100 UNIT/ML ~~LOC~~ SOLN
0.0000 [IU] | Freq: Three times a day (TID) | SUBCUTANEOUS | Status: DC
  Administered 2012-09-29: 2 [IU] via SUBCUTANEOUS

## 2012-09-29 MED ORDER — INSULIN ASPART 100 UNIT/ML ~~LOC~~ SOLN: 0.0000 [IU] | Freq: Three times a day (TID) | SUBCUTANEOUS | Status: DC

## 2012-09-29 MED ORDER — INSULIN ASPART 100 UNIT/ML ~~LOC~~ SOLN: 0.0000 [IU] | Freq: Every day | SUBCUTANEOUS | Status: DC

## 2012-09-29 MED ORDER — INSULIN GLARGINE 100 UNIT/ML ~~LOC~~ SOLN: 20.0000 [IU] | Freq: Every day | SUBCUTANEOUS | Status: DC

## 2012-09-29 MED ORDER — INSULIN ASPART 100 UNIT/ML ~~LOC~~ SOLN
0.0000 [IU] | Freq: Three times a day (TID) | SUBCUTANEOUS | Status: DC
  Administered 2012-09-29: 5 [IU] via SUBCUTANEOUS

## 2012-09-29 MED ORDER — INSULIN GLARGINE 100 UNIT/ML ~~LOC~~ SOLN
20.0000 [IU] | Freq: Every day | SUBCUTANEOUS | Status: DC
  Administered 2012-09-29: 20 [IU] via SUBCUTANEOUS
  Filled 2012-09-29 (×2): qty 0.2

## 2012-09-29 NOTE — Progress Notes (Signed)
Inpatient Diabetes Program Recommendations  AACE/ADA: New Consensus Statement on Inpatient Glycemic Control (2013)  Target Ranges:  Prepandial:   less than 140 mg/dL      Peak postprandial:   less than 180 mg/dL (1-2 hours)      Critically ill patients:  140 - 180 mg/dL    Results for KASSIA, STAVES (MRN [id number]) as of 09/29/2012 11:46  Ref. Range 09/29/2012 04:23 09/29/2012 05:33 09/29/2012 09:06 09/29/2012 09:50 09/29/2012 11:32  Glucose-Capillary Latest Range: 70-99 mg/dL 281 (H) 165 (H) 235 (H) 271 (H) 143 (H)    Note: Patient was admitted with DKA and was placed on the insulin drip.  Patient has now been transitioned to SQ insulin and received Lantus 20 units at 10:40am.  Noted that currently patient is ordered to receive Lantus 20 units daily, Novolog 0-15 units AC, and Novolog 0-5 units HS for inpatient diabetes management.  May want to consider decreasing Novolog to sensitive correction since the patient has a history of type 1 diabetes and is likely to be more sensitive to insulin.  Will continue to follow.  Thanks, Barnie Alderman, RN, MSN, CCRN Diabetes Coordinator Inpatient Diabetes Program [phone]

## 2012-09-29 NOTE — Progress Notes (Signed)
Patient ID: Ashley Freeman, female   DOB: [birth year], 22 y.o.   MRN: [id number]  [facility] PROGRESS NOTE  Ashley Freeman [facility] [id number] DOB: Feb 15, 1991 DOA: 09/27/2012 PCP: Ashley Genre, MD  Brief narrative:  Pt is 22 yo female with history of uncontrolled diabetes, insulin dependent, who left against medical advice one day prior to this admission and now comes back with main concern of progressively worsening weakness, malaise, nausea and poor oral intake. She explains she has not taken any insulin once she left the hospital and has not checked her sugar levels. She is rather poor historian and does not offer lots of details. She denies fevers, chills, no specific abdominal or urinary concerns.  In ED, pt with severe DKA, Bicarb 7, TRH asked to admit to SDU for further evaluation.   Assessment and Plan:  DKA  - secondary to medical non compliance  - pt is clinically improving, anion gap closed and electrolytes stable and within normal limits  - off insulin drip, will start SSI and Lantus - discussed with pt in detail compliance and she explains she is not responding well to insulin 70/30 but is better on Lantus - will start on Lantus and will adjust the dose as indicated  - provided education and counseling of compliance  Anemia of chronic disease  - Hg and Hct stable at pt's baseline, CBC in AM  Leukocytosis  - likely reactive, no signs of infectious etiology evident  - WBC is within normal limits this AM Hyponatremia  - mild and pre renal secondary to dehydration, transition to regular diet and d/c IVF - repeat BMP in AM  Consultants:  None Procedures/Studies:  Dg Chest 2 View 09/27/2012 No active disease of the chest.  Antibiotics:  None  Code Status: Full  Family Communication: Pt at bedside  Disposition Plan: Home when medically stable  HPI/Subjective: No events overnight.   Objective: Filed Vitals:   09/29/12 0400 09/29/12 0500 09/29/12 0600  09/29/12 0800  BP:    128/85  Pulse:    90  Temp: 98.7 F (37.1 C)   98.1 F (36.7 C)  TempSrc: Oral   Oral  Resp: 19 21 26 22   Height:      Weight:      SpO2:        Intake/Output Summary (Last 24 hours) at 09/29/12 0934 Last data filed at 09/29/12 0900  Gross per 24 hour  Intake 3469.71 ml  Output   4400 ml  Net -930.29 ml    Exam:   General:  Pt is alert, follows commands appropriately, not in acute distress  Cardiovascular: Regular rate and rhythm, S1/S2, no murmurs, no rubs, no gallops  Respiratory: Clear to auscultation bilaterally, no wheezing, no crackles, no rhonchi  Abdomen: Soft, non tender, non distended, bowel sounds present, no guarding  Extremities: No edema, pulses DP and PT palpable bilaterally  Neuro: Grossly nonfocal  Data Reviewed: Basic Metabolic Panel:  Recent Labs Lab 09/29/12 0040 09/29/12 0240 09/29/12 0436 09/29/12 0640 09/29/12 0835  NA 136 132* 133* 137 136  K 3.5 3.7 3.5 3.5 3.6  CL 103 101 101 105 103  CO2 19 18* 17* 21 21  GLUCOSE 91 210* 270* 107* 122*  BUN <3* <3* <3* <3* <3*  CREATININE 0.47* 0.43* 0.45* 0.43* 0.44*  CALCIUM 9.3 8.5 8.8 9.1 9.4   Liver Function Tests:  Recent Labs Lab 09/27/12 1027  AST 54*  ALT 32  ALKPHOS 105  BILITOT 0.2*  PROT 8.5*  ALBUMIN 4.0   CBC:  Recent Labs Lab 09/25/12 2135 09/26/12 0640 09/27/12 1027 09/27/12 1354 09/29/12 0436  WBC 22.8* 20.9* 11.8* 12.8* 7.1  NEUTROABS  --   --  9.3*  --   --   HGB 15.9* 9.6* 13.7 10.9* 10.3*  HCT 48.7* 28.9* 40.8 33.1* 31.6*  MCV 88.7 83.0 85.4 85.1 83.4  PLT 392 262 323 283 243   Cardiac Enzymes:  Recent Labs Lab 09/27/12 1355 09/27/12 1925 09/28/12 0115  TROPONINI <0.30 <0.30 <0.30   CBG:  Recent Labs Lab 09/29/12 0108 09/29/12 0212 09/29/12 0318 09/29/12 0423 09/29/12 0533  GLUCAP 108* 162* 238* 281* 165*    Recent Results (from the past 240 hour(s))  MRSA PCR SCREENING     Status: None   Collection Time     09/26/12  5:38 AM      Result Value Range Status   MRSA by PCR NEGATIVE  NEGATIVE Final   Comment:            The GeneXpert MRSA Assay (FDA     approved for NASAL specimens     only), is one component of a     comprehensive MRSA colonization     surveillance program. It is not     intended to diagnose MRSA     infection nor to guide or     monitor treatment for     MRSA infections.  CULTURE, BLOOD (ROUTINE X 2)     Status: None   Collection Time    09/26/12  6:15 AM      Result Value Range Status   Specimen Description BLOOD RIGHT HAND   Final   Special Requests BOTTLES DRAWN AEROBIC ONLY 1CC   Final   Culture  Setup Time 09/26/2012 08:15   Final   Culture     Final   Value:        BLOOD CULTURE RECEIVED NO GROWTH TO DATE CULTURE WILL BE HELD FOR 5 DAYS BEFORE ISSUING A FINAL NEGATIVE REPORT   Report Status PENDING   Incomplete  CULTURE, BLOOD (ROUTINE X 2)     Status: None   Collection Time    09/26/12  6:40 AM      Result Value Range Status   Specimen Description BLOOD RIGHT ARM   Final   Special Requests     Final   Value: BOTTLES DRAWN AEROBIC AND ANAEROBIC 2CC ANA 3CC AER   Culture  Setup Time 09/26/2012 08:15   Final   Culture     Final   Value:        BLOOD CULTURE RECEIVED NO GROWTH TO DATE CULTURE WILL BE HELD FOR 5 DAYS BEFORE ISSUING A FINAL NEGATIVE REPORT   Report Status PENDING   Incomplete     Scheduled Meds: . antiseptic oral rinse  15 mL Mouth Rinse q12n4p  . chlorhexidine  15 mL Mouth Rinse BID  . enoxaparin (LOVENOX) injection  30 mg Subcutaneous Q24H   Continuous Infusions: . dextrose 5 % and 0.45% NaCl 100 mL/hr at 09/28/12 1845  . dextrose 5 % and 0.45% NaCl    . insulin (NOVOLIN-R) infusion 1.8 Units/hr (09/29/12 0900)    Ashley Ramsay, MD  [facility] Pager [phone]  If 7PM-7AM, please contact night-coverage [email or web address] Password [facility] 09/29/2012, 9:34 AM   LOS: 2 days

## 2012-09-29 NOTE — Progress Notes (Signed)
Patient transferring to room 1315.  Report called to [place], Therapist, sports.  Patient to travel by wheelchair.  Will continue to monitor.

## 2012-09-29 NOTE — Discharge Summary (Signed)
Physician Discharge Summary  DANALI GIGGEY [id number] DOB: Oct 23, 1990 DOA: 09/27/2012  PCP: Cresenciano Genre, MD  Admit date: 09/27/2012 Discharge date: 09/29/2012  Recommendations for Outpatient Follow-up:  1. Pt will need to follow up with PCP in 2-3 weeks post discharge 2. Please obtain BMP to evaluate electrolytes and kidney function 3. Please also check CBC to evaluate Hg and Hct levels 4. Please note that pt explained she is not responding to Insulin 70/30 despite medical compliance and we have discontinued this 5. I have started pt on Lantus long acting insulin and added novolog sliding scale, pt advised to follow up in clinic with myself in next week so that we can readjust the regimen as indicated  Discharge Diagnoses: DKA, secondary to medical non compliance   Discharge Condition: Stable  Diet recommendation: Heart healthy diet discussed in details   Brief narrative:  Pt is 22 yo female with history of uncontrolled diabetes, insulin dependent, who left against medical advice one day prior to this admission and now comes back with main concern of progressively worsening weakness, malaise, nausea and poor oral intake. She explains she has not taken any insulin once she left the hospital and has not checked her sugar levels. She is rather poor historian and does not offer lots of details. She denies fevers, chills, no specific abdominal or urinary concerns.   In ED, pt with severe DKA, Bicarb 7, TRH asked to admit to SDU for further evaluation.   Assessment and Plan:  DKA  - secondary to medical non compliance  - pt is clinically improving, anion gap closed and electrolytes stable and within normal limits  - off insulin drip, started Lantus and Novolog sliding scale  - discussed with pt in detail compliance and she explains she is not responding well to insulin 70/30 but is better on Lantus  - will start on Lantus and will adjust the dose as indicated once she follows up  with me in the clinic  - provided education and counseling of compliance  Anemia of chronic disease  - Hg and Hct stable at pt's baseline Leukocytosis  - likely reactive, no signs of infectious etiology evident  - WBC is within normal limits this AM  Hyponatremia  - mild and pre renal secondary to dehydration, transition to regular diet and d/c IVF   Consultants:  None Procedures/Studies:  Dg Chest 2 View 09/27/2012 No active disease of the chest.  Antibiotics:  None  Code Status: Full  Family Communication: Pt at bedside   Discharge Exam: Filed Vitals:   09/29/12 1500  BP: 110/68  Pulse: 94  Temp: 98.4 F (36.9 C)  Resp: 18   Filed Vitals:   09/29/12 0600 09/29/12 0800 09/29/12 1145 09/29/12 1500  BP:  128/85 131/80 110/68  Pulse:  90  94  Temp:  98.1 F (36.7 C)  98.4 F (36.9 C)  TempSrc:  Oral  Oral  Resp: 26 22 16 18   Height:      Weight:      SpO2:    99%    General: Pt is alert, follows commands appropriately, not in acute distress Cardiovascular: Regular rate and rhythm, S1/S2 +, no murmurs, no rubs, no gallops Respiratory: Clear to auscultation bilaterally, no wheezing, no crackles, no rhonchi Abdominal: Soft, non tender, non distended, bowel sounds +, no guarding Extremities: no edema, no cyanosis, pulses palpable bilaterally DP and PT Neuro: Grossly nonfocal  Discharge Instructions  Discharge Orders   Future Orders Complete By  Expires     Diet - low sodium heart healthy  As directed     Increase activity slowly  As directed         Medication List    STOP taking these medications       NOVOLOG MIX 70/30 FLEXPEN (70-30) 100 UNIT/ML injection  Generic drug:  insulin aspart protamine- aspart      TAKE these medications       insulin aspart 100 UNIT/ML injection  Commonly known as:  novoLOG  Inject 0-9 Units into the skin 3 (three) times daily with meals.     insulin glargine 100 UNIT/ML injection  Commonly known as:  LANTUS  Inject 0.2  mLs (20 Units total) into the skin daily.           Follow-up Information   Follow up with Cresenciano Genre, MD.   Contact information:   [address] [place] [address] [phone]       Follow up with Faye Ramsay, MD In 1 week.   Contact information:   [address] [place] [address] [phone]       The results of significant diagnostics from this hospitalization (including imaging, microbiology, ancillary and laboratory) are listed below for reference.     Microbiology: Recent Results (from the past 240 hour(s))  MRSA PCR SCREENING     Status: None   Collection Time    09/26/12  5:38 AM      Result Value Range Status   MRSA by PCR NEGATIVE  NEGATIVE Final   Comment:            The GeneXpert MRSA Assay (FDA     approved for NASAL specimens     only), is one component of a     comprehensive MRSA colonization     surveillance program. It is not     intended to diagnose MRSA     infection nor to guide or     monitor treatment for     MRSA infections.  CULTURE, BLOOD (ROUTINE X 2)     Status: None   Collection Time    09/26/12  6:15 AM      Result Value Range Status   Specimen Description BLOOD RIGHT HAND   Final   Special Requests BOTTLES DRAWN AEROBIC ONLY 1CC   Final   Culture  Setup Time 09/26/2012 08:15   Final   Culture     Final   Value:        BLOOD CULTURE RECEIVED NO GROWTH TO DATE CULTURE WILL BE HELD FOR 5 DAYS BEFORE ISSUING A FINAL NEGATIVE REPORT   Report Status PENDING   Incomplete  CULTURE, BLOOD (ROUTINE X 2)     Status: None   Collection Time    09/26/12  6:40 AM      Result Value Range Status   Specimen Description BLOOD RIGHT ARM   Final   Special Requests     Final   Value: BOTTLES DRAWN AEROBIC AND ANAEROBIC 2CC ANA 3CC AER   Culture  Setup Time 09/26/2012 08:15   Final   Culture     Final   Value:        BLOOD CULTURE RECEIVED NO GROWTH TO DATE CULTURE WILL BE HELD FOR 5 DAYS  BEFORE ISSUING A FINAL NEGATIVE REPORT   Report Status PENDING   Incomplete     Labs: Basic Metabolic Panel:  Recent Labs Lab 09/29/12 0040  09/29/12 0240 09/29/12 0436 09/29/12 0640 09/29/12 0835  NA 136 132* 133* 137 136  K 3.5 3.7 3.5 3.5 3.6  CL 103 101 101 105 103  CO2 19 18* 17* 21 21  GLUCOSE 91 210* 270* 107* 122*  BUN <3* <3* <3* <3* <3*  CREATININE 0.47* 0.43* 0.45* 0.43* 0.44*  CALCIUM 9.3 8.5 8.8 9.1 9.4   Liver Function Tests:  Recent Labs Lab 09/27/12 1027  AST 54*  ALT 32  ALKPHOS 105  BILITOT 0.2*  PROT 8.5*  ALBUMIN 4.0   No results found for this basename: LIPASE, AMYLASE,  in the last 168 hours No results found for this basename: AMMONIA,  in the last 168 hours CBC:  Recent Labs Lab 09/25/12 2135 09/26/12 0640 09/27/12 1027 09/27/12 1354 09/29/12 0436  WBC 22.8* 20.9* 11.8* 12.8* 7.1  NEUTROABS  --   --  9.3*  --   --   HGB 15.9* 9.6* 13.7 10.9* 10.3*  HCT 48.7* 28.9* 40.8 33.1* 31.6*  MCV 88.7 83.0 85.4 85.1 83.4  PLT 392 262 323 283 243   Cardiac Enzymes:  Recent Labs Lab 09/27/12 1355 09/27/12 1925 09/28/12 0115  TROPONINI <0.30 <0.30 <0.30   BNP: BNP (last 3 results) No results found for this basename: PROBNP,  in the last 8760 hours CBG:  Recent Labs Lab 09/29/12 0639 09/29/12 0801 09/29/12 0906 09/29/12 0950 09/29/12 1132  GLUCAP 109* 97 235* 271* 143*     SIGNED: Time coordinating discharge: Over 30 minutes  Faye Ramsay, MD  [facility] 09/29/2012, 6:58 PM Pager [phone]  If 7PM-7AM, please contact night-coverage [email or web address] Password [email or web address]

## 2012-09-29 NOTE — Progress Notes (Signed)
Nutrition Brief Note  Patient identified on the Malnutrition Screening Tool (MST) Report  Body mass index is 21.03 kg/(m^2). Patient meets criteria for normal weight based on current BMI. Pt reports that her usual weight is 131 lbs.  Diet earlier today was clear liquids and pt did not eat anything because she did not like the food. Pt reports that she did not eat for 1 day PTA but, she was eating well before then and has a good appetite today. Pt states that DKA was caused by a recent change in her medications. Pt reports that she has had diabetes for several years and knows what to eat; pt did request sample menus for diabetes. RD provided two sample menus and encouraged pt to eat a variety of foods and to eat consistent carbohydrates throughout the day. Encouraged pt to decrease intake of regular soda and juice. RD name and contact information provided. Labs and medications reviewed.   No further nutrition interventions warranted at this time. If nutrition issues arise, please consult RD.   Pryor Ochoa RD, LDN Inpatient Clinical Dietitian Pager: [phone] After Hours Pager: [phone]

## 2012-09-29 NOTE — Progress Notes (Signed)
Patient received discharge instructions and verbalized understanding without further questions. Patient prescriptions called into [facility] and follow up appointments discussed. Patient belongings packed. Patient taken downstairs via wheelchair to be discharged home.

## 2012-10-01 ENCOUNTER — Emergency Department (HOSPITAL_COMMUNITY)
Admission: EM | Admit: 2012-10-01 | Discharge: 2012-10-01 | Disposition: A | Discharge status: Home / Self Care | Payer: Medicaid Other | Attending: Emergency Medicine | Admitting: Emergency Medicine

## 2012-10-01 ENCOUNTER — Encounter (HOSPITAL_COMMUNITY): Payer: Self-pay | Admitting: Emergency Medicine

## 2012-10-01 ENCOUNTER — Emergency Department (HOSPITAL_COMMUNITY): Payer: Medicaid Other

## 2012-10-01 DIAGNOSIS — E1069 Type 1 diabetes mellitus with other specified complication: Secondary | ICD-10-CM | POA: Insufficient documentation

## 2012-10-01 DIAGNOSIS — R358 Other polyuria: Secondary | ICD-10-CM | POA: Insufficient documentation

## 2012-10-01 DIAGNOSIS — R3589 Other polyuria: Secondary | ICD-10-CM | POA: Insufficient documentation

## 2012-10-01 DIAGNOSIS — Z8619 Personal history of other infectious and parasitic diseases: Secondary | ICD-10-CM | POA: Insufficient documentation

## 2012-10-01 DIAGNOSIS — R739 Hyperglycemia, unspecified: Secondary | ICD-10-CM

## 2012-10-01 DIAGNOSIS — R079 Chest pain, unspecified: Secondary | ICD-10-CM

## 2012-10-01 DIAGNOSIS — Z880 Allergy status to penicillin: Secondary | ICD-10-CM | POA: Insufficient documentation

## 2012-10-01 DIAGNOSIS — Z794 Long term (current) use of insulin: Secondary | ICD-10-CM | POA: Insufficient documentation

## 2012-10-01 DIAGNOSIS — R631 Polydipsia: Secondary | ICD-10-CM | POA: Insufficient documentation

## 2012-10-01 DIAGNOSIS — R0789 Other chest pain: Secondary | ICD-10-CM | POA: Insufficient documentation

## 2012-10-01 LAB — CBC WITH DIFFERENTIAL/PLATELET
Eosinophils Absolute: 0.1 10*3/uL (ref 0.0–0.7)
HCT: 38 % (ref 36.0–46.0)
Hemoglobin: 12.8 g/dL (ref 12.0–15.0)
Lymphocytes Relative: 37 % (ref 12–46)
Monocytes Absolute: 0.5 10*3/uL (ref 0.1–1.0)
Monocytes Relative: 11 % (ref 3–12)
Neutro Abs: 2.1 10*3/uL (ref 1.7–7.7)
Platelets: 312 10*3/uL (ref 150–400)
WBC: 4.3 10*3/uL (ref 4.0–10.5)

## 2012-10-01 LAB — COMPREHENSIVE METABOLIC PANEL
BUN: 6 mg/dL (ref 6–23)
CO2: 25 mEq/L (ref 19–32)
Chloride: 86 mEq/L — ABNORMAL LOW (ref 96–112)
Creatinine, Ser: 0.49 mg/dL — ABNORMAL LOW (ref 0.50–1.10)
GFR calc non Af Amer: >90 mL/min (ref >90)
Total Bilirubin: 0.4 mg/dL (ref 0.3–1.2)
Total Protein: 7.8 g/dL (ref 6.0–8.3)

## 2012-10-01 LAB — GLUCOSE, CAPILLARY
Glucose-Capillary: 299 mg/dL — ABNORMAL HIGH (ref 70–99)
Glucose-Capillary: >600 mg/dL (ref 70–99)
Glucose-Capillary: 284 mg/dL — ABNORMAL HIGH (ref 70–99)

## 2012-10-01 LAB — URINALYSIS, ROUTINE W REFLEX MICROSCOPIC
Hgb urine dipstick: NEGATIVE
Nitrite: NEGATIVE
Protein, ur: NEGATIVE mg/dL
Urobilinogen, UA: 0.2 mg/dL (ref 0.0–1.0)

## 2012-10-01 LAB — URINE MICROSCOPIC-ADD ON

## 2012-10-01 MED ORDER — SODIUM CHLORIDE 0.9 % IV SOLN: 1000.0000 mL | Freq: Once | INTRAVENOUS | Status: DC

## 2012-10-01 MED ORDER — SODIUM CHLORIDE 0.9 % IV SOLN: 1000.0000 mL | INTRAVENOUS | Status: DC

## 2012-10-01 MED ORDER — INSULIN ASPART 100 UNIT/ML ~~LOC~~ SOLN
10.0000 [IU] | Freq: Once | SUBCUTANEOUS | Status: AC
  Administered 2012-10-01: 10 [IU] via INTRAVENOUS
  Filled 2012-10-01: qty 1

## 2012-10-01 MED ORDER — SODIUM CHLORIDE 0.9 % IV SOLN
INTRAVENOUS | Status: DC
  Filled 2012-10-01: qty 1

## 2012-10-01 MED ORDER — SODIUM CHLORIDE 0.9 % IV BOLUS (SEPSIS)
1500.0000 mL | Freq: Once | INTRAVENOUS | Status: AC
  Administered 2012-10-01: 1500 mL via INTRAVENOUS

## 2012-10-01 MED ORDER — FREESTYLE SYSTEM KIT: 1.0000 | PACK | Status: DC | PRN

## 2012-10-01 MED ORDER — SODIUM CHLORIDE 0.9 % IV BOLUS (SEPSIS): 1000.0000 mL | Freq: Once | INTRAVENOUS | Status: DC

## 2012-10-01 NOTE — ED Notes (Signed)
Patient transported to X-ray 

## 2012-10-01 NOTE — ED Notes (Signed)
Pt states entire chest has been hurting 4-5 days.

## 2012-10-01 NOTE — Progress Notes (Signed)
   CARE MANAGEMENT ED NOTE 10/01/2012  Patient:  Ashley Freeman, Ashley Freeman   Account Number:  [id number]  Date Initiated:  10/01/2012  Documentation initiated by:    Subjective/Objective Assessment:   Patient presented to ED with blood sugar of 783 and chest pain for 4-5 days.     Subjective/Objective Assessment Detail:     Action/Plan:   Action/Plan Detail:   Anticipated DC Date:       Status Recommendation to Physician:   Result of Recommendation:    Other ED Services  Consult Working Plan      Choice offered to / List presented to:            Status of service:    ED Comments:   ED Comments Detail:  Patient was just discharged from [facility] on September 29, 2012.  Patient was admitted then with DKA at that time and presents with similar symptoms this ED visit. EDCM saw that the disbetic coordinator saw her on June 2. EDCM paged diabetic coordinaor and spoke to Gwen Pounds to let her know that this patient was back in the ED.  As per Diabetic coordinator will follow her if admitted.

## 2012-10-01 NOTE — ED Provider Notes (Signed)
History     CSN: [id number]  Arrival date & time 10/01/12  1235   First MD Initiated Contact with Patient 10/01/12 1244      Chief Complaint  Patient presents with  . Chest Pain    (Consider location/radiation/quality/duration/timing/severity/associated sxs/prior treatment) HPI Comments: Pt presents to the ED for generalized chest pain x 4 days.  Described pain as a generalized pressure over her entire chest that is causing her to be SOB at times.  Pt was recently hospitalized for DKA.  States she has been taking her insulin as directed but has not been checking her blood sugar because she needs a new glucometer.  Endorses polyuria and polydipsia.  Denies any palpitations, dizziness, weakness, nausea, vomiting, diarrhea, or dysuria.  No recent sick contacts, cough, fevers, sweats, or chills.  Pt has not taken any meds for her sx.    The history is provided by the patient.    Past Medical History  Diagnosis Date  . Gonorrhea 08/2011    Treated in 09/2011  . Diabetes mellitus 2001    Diagnosed at age 70 ; Type I    Past Surgical History  Procedure Laterality Date  . No past surgeries      Family History  Problem Relation Age of Onset  . Anesthesia problems Neg Hx   . Asthma Mother   . Gout Father   . Diabetes Paternal Grandmother     History  Substance Use Topics  . Smoking status: Never Smoker   . Smokeless tobacco: Never Used  . Alcohol Use: No    OB History   Grav Para Term Preterm Abortions TAB SAB Ect Mult Living   1 0   1  1   0      Review of Systems  Cardiovascular: Positive for chest pain.  All other systems reviewed and are negative.    Allergies  Penicillins  Home Medications   Current Outpatient Rx  Name  Route  Sig  Dispense  Refill  . insulin aspart (NOVOLOG) 100 UNIT/ML injection   Subcutaneous   Inject 0-9 Units into the skin 3 (three) times daily with meals.   1 vial   12   . insulin glargine (LANTUS) 100 UNIT/ML injection  Subcutaneous   Inject 0.2 mLs (20 Units total) into the skin daily.   10 mL   12     BP 142/97  Pulse 97  Temp(Src) 98.1 F (36.7 C) (Oral)  Resp 15  SpO2 99%  LMP 09/25/2012  Physical Exam  Nursing note and vitals reviewed. Constitutional: She is oriented to person, place, and time. She appears well-developed and well-nourished. No distress.  HENT:  Head: Normocephalic and atraumatic.  Mouth/Throat: Oropharynx is clear and moist.  Eyes: Conjunctivae and EOM are normal. Pupils are equal, round, and reactive to light.  Neck: Normal range of motion. Neck supple.  Cardiovascular: Normal rate, regular rhythm and normal heart sounds.   Pulmonary/Chest: Effort normal and breath sounds normal. No respiratory distress. She has no wheezes.  No TTP of chest wall  Abdominal: Soft. Bowel sounds are normal. There is no tenderness. There is no guarding.  Musculoskeletal: Normal range of motion. She exhibits no edema.  Neurological: She is alert and oriented to person, place, and time.  Skin: Skin is warm and dry. She is not diaphoretic.  Psychiatric: She has a normal mood and affect.    ED Course  Procedures (including critical care time)   Date: 10/01/2012  Rate: 86  Rhythm: normal sinus rhythm  QRS Axis: normal  Intervals: normal  ST/T Wave abnormalities: normal  Conduction Disutrbances:none  Narrative Interpretation: NSR, no STEMI  Old EKG Reviewed: unchanged    Labs Reviewed  GLUCOSE, CAPILLARY - Abnormal; Notable for the following:    Glucose-Capillary >600 (*)    All other components within normal limits  COMPREHENSIVE METABOLIC PANEL - Abnormal; Notable for the following:    Sodium 127 (*)    Chloride 86 (*)    Glucose, Bld 783 (*)    Creatinine, Ser 0.49 (*)    All other components within normal limits  URINALYSIS, ROUTINE W REFLEX MICROSCOPIC - Abnormal; Notable for the following:    Specific Gravity, Urine 1.033 (*)    Glucose, UA >1000 (*)    Ketones, ur 15  (*)    All other components within normal limits  GLUCOSE, CAPILLARY - Abnormal; Notable for the following:    Glucose-Capillary >600 (*)    All other components within normal limits  URINE MICROSCOPIC-ADD ON - Abnormal; Notable for the following:    Squamous Epithelial / LPF FEW (*)    All other components within normal limits  GLUCOSE, CAPILLARY - Abnormal; Notable for the following:    Glucose-Capillary 284 (*)    All other components within normal limits  CBC WITH DIFFERENTIAL   Dg Chest 2 View  10/01/2012   *RADIOLOGY REPORT*  Clinical Data: Chest pain, shortness of breath  CHEST - 2 VIEW  Comparison: Chest x-ray of 09/27/2012  Findings: The lungs are clear.  Mediastinal contours appear normal. The heart is within normal limits in size.  No bony abnormality is seen.  IMPRESSION: No active lung disease   Original Report Authenticated By: Ivar Drape, M.D.     1. Chest pain   2. Hyperglycemia       MDM   EKG NSR, no acute ischemic changes.  CXR clear.  CBG >600 but pt does not appear in DKA- electrolyte abnormalities are minimal with anion gap of 16, CO2 WNL, and urine with minimal ketones.  Pt afebrile, non-toxic appearing, NAD, VS stable- ok for d/c.  Pt has previously scheduled FU with her PCP tomorrow.  Given rx for new glucometer- instructed to continue insulin as directed and adjust dosing according to her sliding scale.  Discussed plan with pt, she agreed.  Return precautions advised.        Larene Pickett, PA-C 10/02/12 1325

## 2012-10-02 ENCOUNTER — Ambulatory Visit: Payer: Medicaid Other | Admitting: Internal Medicine

## 2012-10-02 LAB — CULTURE, BLOOD (ROUTINE X 2)
Culture: NO GROWTH
Culture: NO GROWTH

## 2012-10-02 NOTE — ED Provider Notes (Signed)
Medical screening examination/treatment/procedure(s) were performed by non-physician practitioner and as supervising physician I was immediately available for consultation/collaboration.   Hoy Morn, MD 10/02/12 2141

## 2012-10-26 ENCOUNTER — Inpatient Hospital Stay (HOSPITAL_COMMUNITY)
Admission: EM | Admit: 2012-10-26 | Discharge: 2012-10-29 | DRG: 638 | Disposition: A | Discharge status: Home / Self Care | Payer: Medicaid Other | Attending: Pulmonary Disease | Admitting: Pulmonary Disease

## 2012-10-26 ENCOUNTER — Inpatient Hospital Stay (HOSPITAL_COMMUNITY): Payer: Medicaid Other

## 2012-10-26 DIAGNOSIS — E109 Type 1 diabetes mellitus without complications: Secondary | ICD-10-CM

## 2012-10-26 DIAGNOSIS — E1010 Type 1 diabetes mellitus with ketoacidosis without coma: Principal | ICD-10-CM | POA: Diagnosis present

## 2012-10-26 DIAGNOSIS — Z794 Long term (current) use of insulin: Secondary | ICD-10-CM

## 2012-10-26 DIAGNOSIS — R7309 Other abnormal glucose: Secondary | ICD-10-CM

## 2012-10-26 DIAGNOSIS — Z880 Allergy status to penicillin: Secondary | ICD-10-CM

## 2012-10-26 DIAGNOSIS — I498 Other specified cardiac arrhythmias: Secondary | ICD-10-CM | POA: Diagnosis present

## 2012-10-26 DIAGNOSIS — E872 Acidosis, unspecified: Secondary | ICD-10-CM

## 2012-10-26 DIAGNOSIS — E876 Hypokalemia: Secondary | ICD-10-CM

## 2012-10-26 DIAGNOSIS — E1110 Type 2 diabetes mellitus with ketoacidosis without coma: Secondary | ICD-10-CM

## 2012-10-26 DIAGNOSIS — N390 Urinary tract infection, site not specified: Secondary | ICD-10-CM | POA: Diagnosis present

## 2012-10-26 DIAGNOSIS — E871 Hypo-osmolality and hyponatremia: Secondary | ICD-10-CM

## 2012-10-26 DIAGNOSIS — D649 Anemia, unspecified: Secondary | ICD-10-CM

## 2012-10-26 DIAGNOSIS — R739 Hyperglycemia, unspecified: Secondary | ICD-10-CM

## 2012-10-26 DIAGNOSIS — E875 Hyperkalemia: Secondary | ICD-10-CM

## 2012-10-26 DIAGNOSIS — R5381 Other malaise: Secondary | ICD-10-CM | POA: Diagnosis present

## 2012-10-26 DIAGNOSIS — D72829 Elevated white blood cell count, unspecified: Secondary | ICD-10-CM

## 2012-10-26 DIAGNOSIS — B951 Streptococcus, group B, as the cause of diseases classified elsewhere: Secondary | ICD-10-CM | POA: Diagnosis present

## 2012-10-26 DIAGNOSIS — N289 Disorder of kidney and ureter, unspecified: Secondary | ICD-10-CM

## 2012-10-26 DIAGNOSIS — E873 Alkalosis: Secondary | ICD-10-CM

## 2012-10-26 DIAGNOSIS — R0602 Shortness of breath: Secondary | ICD-10-CM | POA: Diagnosis present

## 2012-10-26 LAB — BASIC METABOLIC PANEL
BUN: 21 mg/dL (ref 6–23)
BUN: 20 mg/dL (ref 6–23)
BUN: 18 mg/dL (ref 6–23)
CO2: <7 mEq/L — CL (ref 19–32)
CO2: <7 mEq/L — CL (ref 19–32)
CO2: 9 mEq/L — CL (ref 19–32)
Calcium: 9.8 mg/dL (ref 8.4–10.5)
Calcium: 10 mg/dL (ref 8.4–10.5)
Calcium: 9.9 mg/dL (ref 8.4–10.5)
Chloride: 110 mEq/L (ref 96–112)
Creatinine, Ser: 0.94 mg/dL (ref 0.50–1.10)
Creatinine, Ser: 0.92 mg/dL (ref 0.50–1.10)
Creatinine, Ser: 0.8 mg/dL (ref 0.50–1.10)
GFR calc Af Amer: >90 mL/min (ref >90)
GFR calc non Af Amer: 89 mL/min — ABNORMAL LOW (ref >90)
GFR calc non Af Amer: >90 mL/min (ref >90)
Glucose, Bld: 476 mg/dL — ABNORMAL HIGH (ref 70–99)
Glucose, Bld: 329 mg/dL — ABNORMAL HIGH (ref 70–99)
Glucose, Bld: 250 mg/dL — ABNORMAL HIGH (ref 70–99)
Potassium: 4.2 mEq/L (ref 3.5–5.1)
Sodium: 146 mEq/L — ABNORMAL HIGH (ref 135–145)

## 2012-10-26 LAB — URINALYSIS, ROUTINE W REFLEX MICROSCOPIC
Glucose, UA: >1000 mg/dL — AB
Leukocytes, UA: NEGATIVE
Nitrite: NEGATIVE
Protein, ur: 30 mg/dL — AB
Specific Gravity, Urine: 1.031 — ABNORMAL HIGH (ref 1.005–1.030)
pH: 5 (ref 5.0–8.0)

## 2012-10-26 LAB — CBC WITH DIFFERENTIAL/PLATELET
Basophils Absolute: 0.4 10*3/uL — ABNORMAL HIGH (ref 0.0–0.1)
Basophils Relative: 1 % (ref 0–1)
Eosinophils Absolute: 0.4 10*3/uL (ref 0.0–0.7)
HCT: 43.4 % (ref 36.0–46.0)
Lymphocytes Relative: 18 % (ref 12–46)
Lymphs Abs: 7.1 10*3/uL — ABNORMAL HIGH (ref 0.7–4.0)
MCH: 28.3 pg (ref 26.0–34.0)
MCHC: 31.8 g/dL (ref 30.0–36.0)
MCV: 89.1 fL (ref 78.0–100.0)
Monocytes Absolute: 3.1 10*3/uL — ABNORMAL HIGH (ref 0.1–1.0)
Monocytes Relative: 8 % (ref 3–12)
Neutro Abs: 28.2 10*3/uL — ABNORMAL HIGH (ref 1.7–7.7)
RDW: 13.6 % (ref 11.5–15.5)
WBC: 39.2 10*3/uL — ABNORMAL HIGH (ref 4.0–10.5)

## 2012-10-26 LAB — PREGNANCY, URINE: Preg Test, Ur: NEGATIVE

## 2012-10-26 LAB — URINE MICROSCOPIC-ADD ON

## 2012-10-26 LAB — GLUCOSE, CAPILLARY
Glucose-Capillary: >600 mg/dL (ref 70–99)
Glucose-Capillary: 490 mg/dL — ABNORMAL HIGH (ref 70–99)

## 2012-10-26 LAB — COMPREHENSIVE METABOLIC PANEL
Albumin: 4.3 g/dL (ref 3.5–5.2)
BUN: 25 mg/dL — ABNORMAL HIGH (ref 6–23)
Calcium: 10.9 mg/dL — ABNORMAL HIGH (ref 8.4–10.5)
Chloride: 86 mEq/L — ABNORMAL LOW (ref 96–112)
Creatinine, Ser: 0.99 mg/dL (ref 0.50–1.10)
GFR calc Af Amer: >90 mL/min (ref >90)
Glucose, Bld: 1018 mg/dL (ref 70–99)
Total Protein: 8.6 g/dL — ABNORMAL HIGH (ref 6.0–8.3)

## 2012-10-26 LAB — MRSA PCR SCREENING: MRSA by PCR: NEGATIVE

## 2012-10-26 MED ORDER — INSULIN REGULAR BOLUS VIA INFUSION
0.0000 [IU] | Freq: Three times a day (TID) | INTRAVENOUS | Status: DC
  Filled 2012-10-26: qty 10

## 2012-10-26 MED ORDER — SODIUM CHLORIDE 0.9 % IV SOLN: Freq: Once | INTRAVENOUS | Status: DC

## 2012-10-26 MED ORDER — SODIUM CHLORIDE 0.9 % IV SOLN
1000.0000 mL | INTRAVENOUS | Status: DC
  Administered 2012-10-26: 1000 mL via INTRAVENOUS

## 2012-10-26 MED ORDER — PANTOPRAZOLE SODIUM 40 MG IV SOLR
40.0000 mg | INTRAVENOUS | Status: DC
  Administered 2012-10-26 – 2012-10-28 (×3): 40 mg via INTRAVENOUS
  Filled 2012-10-26 (×3): qty 40

## 2012-10-26 MED ORDER — SODIUM CHLORIDE 0.9 % IV SOLN
INTRAVENOUS | Status: DC
  Administered 2012-10-26: 5.4 [IU]/h via INTRAVENOUS
  Filled 2012-10-26: qty 1

## 2012-10-26 MED ORDER — DEXTROSE-NACL 5-0.45 % IV SOLN
INTRAVENOUS | Status: DC
  Administered 2012-10-26: 75 mL/h via INTRAVENOUS
  Administered 2012-10-26: 75 mL via INTRAVENOUS

## 2012-10-26 MED ORDER — SODIUM CHLORIDE 0.9 % IV SOLN
1000.0000 mL | Freq: Once | INTRAVENOUS | Status: AC
  Administered 2012-10-26: 1000 mL via INTRAVENOUS

## 2012-10-26 MED ORDER — SODIUM CHLORIDE 0.9 % IV SOLN
INTRAVENOUS | Status: DC
  Administered 2012-10-26: 11:00:00 via INTRAVENOUS

## 2012-10-26 MED ORDER — SODIUM CHLORIDE 0.9 % IV SOLN
INTRAVENOUS | Status: DC
  Administered 2012-10-26: 5.6 [IU]/h via INTRAVENOUS
  Administered 2012-10-26: 10.8 [IU]/h via INTRAVENOUS
  Filled 2012-10-26 (×2): qty 1

## 2012-10-26 MED ORDER — SODIUM CHLORIDE 0.9 % IV SOLN: INTRAVENOUS | Status: AC

## 2012-10-26 MED ORDER — SODIUM CHLORIDE 0.9 % IV SOLN: INTRAVENOUS | Status: DC

## 2012-10-26 MED ORDER — DEXTROSE-NACL 5-0.45 % IV SOLN
INTRAVENOUS | Status: DC
  Administered 2012-10-27: 16:00:00 via INTRAVENOUS
  Administered 2012-10-27: 10 mL via INTRAVENOUS
  Administered 2012-10-28: 09:00:00 via INTRAVENOUS
  Administered 2012-10-28: 125 mL via INTRAVENOUS

## 2012-10-26 MED ORDER — DEXTROSE 50 % IV SOLN: 25.0000 mL | INTRAVENOUS | Status: DC | PRN

## 2012-10-26 MED ORDER — HEPARIN SODIUM (PORCINE) 5000 UNIT/ML IJ SOLN
5000.0000 [IU] | Freq: Three times a day (TID) | INTRAMUSCULAR | Status: DC
  Administered 2012-10-26 – 2012-10-29 (×9): 5000 [IU] via SUBCUTANEOUS
  Filled 2012-10-26 (×13): qty 1

## 2012-10-26 NOTE — ED Notes (Addendum)
Dr Rolland Porter notified of abnormal glucose and TC02 lab values.

## 2012-10-26 NOTE — H&P (Signed)
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  No fever, chills, N/V, recent sexual activity, currently on her period, no burning upon urination or rashes to indicate infection.  Patient is unwilling to talk much to was unable to get much history beyond above.  SIGNIFICANT EVENTS / STUDIES:  DKA 6/29>>>  LINES / TUBES: PIV  CULTURES: Blood 6/29>>> Urine 6/29>>>  ANTIBIOTICS: None  PAST MEDICAL HISTORY :  Past Medical History  Diagnosis Date  . Gonorrhea 08/2011    Treated in 09/2011  . Diabetes mellitus 2001    Diagnosed at age 59 ; Type I   Past Surgical History  Procedure Laterality Date  . No past surgeries     Prior to Admission medications   Medication Sig Start Date End Date Taking? Authorizing Provider  insulin aspart (NOVOLOG) 100 UNIT/ML injection Inject 0-10 Units into the skin 2 (two) times daily. SSI: pt bases dose on amount of carbohydrates 09/29/12  Yes Theodis Blaze, MD  insulin glargine (LANTUS) 100 UNIT/ML injection Inject 10 Units into the skin 2 (two) times daily. 09/29/12  Yes Theodis Blaze, MD  glucose monitoring kit (FREESTYLE) monitoring kit 1 each by Does not apply route as needed for other. 10/01/12   Larene Pickett, PA-C   Allergies  Allergen Reactions  . Penicillins Hives   FAMILY HISTORY:  Family History  Problem Relation Age of Onset  . Anesthesia problems Neg Hx   . Asthma Mother   . Gout Father   . Diabetes Paternal Grandmother    SOCIAL HISTORY:  reports that she has never smoked. She has never used smokeless tobacco. She reports that she does not drink alcohol or use illicit drugs.  REVIEW OF SYSTEMS:   Patient not willing to speak much after questions above and was unable to get further history.  SUBJECTIVE: Not talking, feels poorly.  VITAL SIGNS: Temp:  [98.6 F (37 C)] 98.6 F (37 C) (06/29 0703) Pulse Rate:  [137] 137 (06/29 0703) Resp:  [28-40] 40 (06/29 0945) BP: (104-118)/(35-71) 104/65 mmHg (06/29 0945) SpO2:  [97 %-99 %] 99 % (06/29 0758) HEMODYNAMICS:   VENTILATOR SETTINGS:   INTAKE / OUTPUT: Intake/Output   None     PHYSICAL EXAMINATION: General:  Thin female, in clear respiratory distress with high respiratory rate. Neuro:  Awake but not answering questions.  Moving all ext to command. HEENT:  [place]/AT, PERRL, EOM-I and DMM. Cardiovascular:  Tachy, regular, Nl S1/S2, -M/R/G. Lungs:  CTA bilaterally. Abdomen:  Soft, NT, ND and +BS. Musculoskeletal:  -edema and -tenderness. Skin:  Intact, no rashes.  LABS:  Recent Labs Lab 10/26/12 0809 10/26/12 0820  HGB  --  13.8  WBC  --  39.2*  PLT  --  503*  NA  --  133*  K  --  5.5*  CL  --  86*  CO2  --  <7*  GLUCOSE  --  1018*  BUN  --  25*  CREATININE  --  0.99  CALCIUM  --  10.9*  AST  --  22  ALT  --  14  ALKPHOS  --  131*  BILITOT  --  0.2*  PROT  --  8.6*  ALBUMIN  --  4.3  PHART 6.993*  --   PCO2ART 8.0*  --   PO2ART 137.0*  --     Recent Labs Lab 10/26/12 0706  GLUCAP >600*    CXR: Pending  ASSESSMENT / PLAN:  PULMONARY A: No active issues. P:   - Sat monitor. - O2 if needed.  CARDIOVASCULAR A: Sinus tach due to DKA and hypovolemia. P:  - Fluid resuscitate.  RENAL A:  Hyperkalemia. P:   - Hydrate. - Recheck BMET per DKA protocol.  GASTROINTESTINAL A:  No active issues. P:   - NPO til DKA resolves.  HEMATOLOGIC A:  Leukocytosis likely hemoconcentration since Hg and Plat are also elevated as well as stress response. P:  - Hydrate. - Monitor.  INFECTIOUS A:  No evidence of infection. P:   - Pan culture. - Hold off abx for now since no signs of active infection,  if U/A or CXR point to the contrary will treat as indicated.  ENDOCRINE A:  IDDM   P:   - DKA protocol - Will need diabetic educator.  NEUROLOGIC A:  No active issues. P:   - Monitor.  TODAY'S SUMMARY: DKA protocol.  I have personally obtained a history, examined the patient, evaluated laboratory and imaging results, formulated the assessment and plan and placed orders.  Rush Farmer, M.D. Pulmonary and [place] Pager: [phone]  10/26/2012, 10:44 AM

## 2012-10-26 NOTE — ED Notes (Signed)
Dr Eliane Decree aware of pt VS

## 2012-10-26 NOTE — ED Notes (Signed)
Ashley Freeman made aware of pt vital signs and critical high blood glucose.

## 2012-10-26 NOTE — ED Notes (Signed)
[id number] Expected date:<BR> Expected time:<BR> Means of arrival:<BR> Comments:<BR> EMS

## 2012-10-26 NOTE — ED Notes (Signed)
I notify Dr. Tomi Bamberger of critical lab results, including glucose (1018) and total CO2 (3).

## 2012-10-26 NOTE — ED Notes (Signed)
Per EMS, pt was called to home by patient ambulating towards EMS upon arrival. She collapsed when they arrived. Sitting on the ground, stating she is in DKA and she needed to lye down.  Pt refused to follow commands, But conscious A & O CBG- over 600.  VS- BP 136/97 P: 145 RR: 16

## 2012-10-26 NOTE — ED Notes (Signed)
Pt requesting ice. Gave pt ice per Dr Meda Coffee Tomi Bamberger

## 2012-10-26 NOTE — ED Provider Notes (Signed)
History    CSN: [id number] Arrival date & time 10/26/12  0703  First MD Initiated Contact with Patient 10/26/12 0730     Chief Complaint  Patient presents with  . Hyperglycemia  . Abdominal Pain   Level 5 Caveat for altered mental status  (Consider location/radiation/quality/duration/timing/severity/associated sxs/prior Treatment) HPI  Patient does not answer any questions or follow any commands. She appears to be awake and appears to be in DKA. Per EMS they were called to her home and she collapsed walking to the embolus. They report her CBG was over 600. Patient will not answer me if I ask her if she has run out of her insulin. She cries out in pain intermittently however she cannot answer any questions about where her pain is located in what is going on.  PCP Dr Aundra Dubin  Past Medical History  Diagnosis Date  . Gonorrhea 08/2011    Treated in 09/2011  . Diabetes mellitus 2001    Diagnosed at age 29 ; Type I   Past Surgical History  Procedure Laterality Date  . No past surgeries     Family History  Problem Relation Age of Onset  . Anesthesia problems Neg Hx   . Asthma Mother   . Gout Father   . Diabetes Paternal Grandmother    History  Substance Use Topics  . Smoking status: Never Smoker   . Smokeless tobacco: Never Used  . Alcohol Use: No  lives at home   OB History   Grav Para Term Preterm Abortions TAB SAB Ect Mult Living   1 0   1  1   0     Review of Systems  Unable to perform ROS: Mental status change    Allergies  Penicillins  Home Medications   Current Outpatient Rx  Name  Route  Sig  Dispense  Refill  . glucose monitoring kit (FREESTYLE) monitoring kit   Does not apply   1 each by Does not apply route as needed for other.   1 each   0   . insulin aspart (NOVOLOG) 100 UNIT/ML injection   Subcutaneous   Inject 0-10 Units into the skin 2 (two) times daily. SSI: pt bases dose on amount of carbohydrates         . insulin glargine (LANTUS)  100 UNIT/ML injection   Subcutaneous   Inject 10 Units into the skin 2 (two) times daily.          BP 104/71  Pulse 137  Temp(Src) 98.6 F (37 C) (Oral)  Resp 38  SpO2 97%  Vital signs normal   Physical Exam  Nursing note and vitals reviewed. Constitutional: She appears well-developed and well-nourished.  Non-toxic appearance. She does not appear ill. No distress.  Patient appears to be awake however she does not answer any questions, she intermittently throws her arms around and groans.  HENT:  Head: Normocephalic and atraumatic.  Right Ear: External ear normal.  Left Ear: External ear normal.  Nose: Nose normal. No mucosal edema or rhinorrhea.  Mouth/Throat: Mucous membranes are normal. No dental abscesses or edematous.  Dry tongue  Eyes: Conjunctivae and EOM are normal. Pupils are equal, round, and reactive to light.  Neck: Normal range of motion and full passive range of motion without pain. Neck supple.  Cardiovascular: Regular rhythm and normal heart sounds.  Tachycardia present.  Exam reveals no gallop and no friction rub.   No murmur heard. Pulmonary/Chest: Breath sounds normal. Accessory muscle usage present.  Tachypnea noted. She is in respiratory distress. She has no wheezes. She has no rhonchi. She has no rales. She exhibits no tenderness and no crepitus.  Abdominal: Soft. Normal appearance and bowel sounds are normal. She exhibits no distension. There is no tenderness. There is no rebound and no guarding.  Musculoskeletal: Normal range of motion. She exhibits no edema and no tenderness.  Moves all extremities well.   Neurological: She is alert. She has normal strength. No cranial nerve deficit.  Skin: Skin is warm, dry and intact. No rash noted. No erythema. No pallor.  Psychiatric: Her mood appears anxious. She is agitated.    ED Course  Procedures (including critical care time)  Medications  0.9 %  sodium chloride infusion (0 mLs Intravenous Stopped 10/26/12  0855)    Followed by  0.9 %  sodium chloride infusion (0 mLs Intravenous Stopped 10/26/12 0954)    Followed by  0.9 %  sodium chloride infusion (1,000 mLs Intravenous New Bag/Given 10/26/12 0954)  dextrose 5 %-0.45 % sodium chloride infusion (not administered)  insulin regular bolus via infusion 0-10 Units (not administered)  insulin regular (NOVOLIN R,HUMULIN R) 1 Units/mL in sodium chloride 0.9 % 100 mL infusion (5.4 Units/hr Intravenous New Bag/Given 10/26/12 0949)  dextrose 50 % solution 25 mL (not administered)  0.9 %  sodium chloride infusion (not administered)  0.9 %  sodium chloride infusion (not administered)   09:30 pt has received her first 2 bolus of NS and is now on NS drip, her insulin drip is being started.  09:52 Dr Alva Garnet, critical care, will have Dr Nelda Marseille come admit patient.   Results for orders placed during the hospital encounter of 10/26/12  GLUCOSE, CAPILLARY      Result Value Range   Glucose-Capillary >600 (*) 70 - 99 mg/dL   Comment 1 Documented in Chart     Comment 2 Notify RN    CBC WITH DIFFERENTIAL      Result Value Range   WBC 39.2 (*) 4.0 - 10.5 K/uL   RBC 4.87  3.87 - 5.11 MIL/uL   Hemoglobin 13.8  12.0 - 15.0 g/dL   HCT 43.4  36.0 - 46.0 %   MCV 89.1  78.0 - 100.0 fL   MCH 28.3  26.0 - 34.0 pg   MCHC 31.8  30.0 - 36.0 g/dL   RDW 13.6  11.5 - 15.5 %   Platelets 503 (*) 150 - 400 K/uL   Neutrophils Relative % 72  43 - 77 %   Lymphocytes Relative 18  12 - 46 %   Monocytes Relative 8  3 - 12 %   Eosinophils Relative 1  0 - 5 %   Basophils Relative 1  0 - 1 %   Neutro Abs 28.2 (*) 1.7 - 7.7 K/uL   Lymphs Abs 7.1 (*) 0.7 - 4.0 K/uL   Monocytes Absolute 3.1 (*) 0.1 - 1.0 K/uL   Eosinophils Absolute 0.4  0.0 - 0.7 K/uL   Basophils Absolute 0.4 (*) 0.0 - 0.1 K/uL   WBC Morphology       Value: MODERATE LEFT SHIFT (>5% METAS AND MYELOS,OCC PRO NOTED)  COMPREHENSIVE METABOLIC PANEL      Result Value Range   Sodium 133 (*) 135 - 145 mEq/L   Potassium  5.5 (*) 3.5 - 5.1 mEq/L   Chloride 86 (*) 96 - 112 mEq/L   CO2 <7 (*) 19 - 32 mEq/L   Glucose, Bld 1018 (*) 70 - 99 mg/dL  BUN 25 (*) 6 - 23 mg/dL   Creatinine, Ser 0.99  0.50 - 1.10 mg/dL   Calcium 10.9 (*) 8.4 - 10.5 mg/dL   Total Protein 8.6 (*) 6.0 - 8.3 g/dL   Albumin 4.3  3.5 - 5.2 g/dL   AST 22  0 - 37 U/L   ALT 14  0 - 35 U/L   Alkaline Phosphatase 131 (*) 39 - 117 U/L   Total Bilirubin 0.2 (*) 0.3 - 1.2 mg/dL   GFR calc non Af Amer 81 (*) >90 mL/min   GFR calc Af Amer >90  >90 mL/min  BLOOD GAS, ARTERIAL      Result Value Range   O2 Content ROOM AIR     pH, Arterial 6.993 (*) 7.350 - 7.450   pCO2 arterial 8.0 (*) 35.0 - 45.0 mmHg   pO2, Arterial 137.0 (*) 80.0 - 100.0 mmHg   Bicarbonate 1.8 (*) 20.0 - 24.0 mEq/L   TCO2 1.8  0 - 100 mmol/L   Acid-Base Excess PENDING  0.0 - 2.0 mmol/L   O2 Saturation 95.7     Patient temperature 98.6     Collection site RIGHT RADIAL     Drawn by [id number]     Sample type ARTERIAL DRAW     Allens test (pass/fail) PASS  PASS   Laboratory interpretation all normal except leukocytosis, metabolic acidosis, diabetic ketoacidosis  I stat 8 done initially, did not cross over sodium 135, potassium 5.6, chloride 109, bicarbonate less than 5, glucose greater than 700, BUN 26, creatinine 1.2, hemoglobin 16.3, hematocrit 48    Dg Chest 2 View  10/01/2012 .  IMPRESSION: No active lung disease   Original Report Authenticated By: Ivar Drape, M.D.   Dg Chest 2 View  09/27/2012   * IMPRESSION: No active disease of the chest.   Original Report Authenticated By: Lajean Manes, M.D.     Date: 10/26/2012  Rate: 141  Rhythm: sinus tachycardia  QRS Axis: right  Intervals: QT prolonged  ST/T Wave abnormalities: nonspecific ST/T changes  Conduction Disutrbances:none  Narrative Interpretation:   Old EKG Reviewed: none available     1. DKA (diabetic ketoacidoses)      Plan admission   Rolland Porter, MD, FACEP   CRITICAL CARE Performed by:  Rolland Porter L Total critical care time: 36 min  Critical care time was exclusive of separately billable procedures and treating other patients. Critical care was necessary to treat or prevent imminent or life-threatening deterioration. Critical care was time spent personally by me on the following activities: development of treatment plan with patient and/or surrogate as well as nursing, discussions with consultants, evaluation of patient's response to treatment, examination of patient, obtaining history from patient or surrogate, ordering and performing treatments and interventions, ordering and review of laboratory studies, ordering and review of radiographic studies, pulse oximetry and re-evaluation of patient's condition.    MDM    Janice Norrie, MD 10/26/12 [phone]

## 2012-10-26 NOTE — ED Notes (Signed)
Istat values not crossing over. Pt critical results given to Dr. Eliane Decree per Tracey Harries, NT

## 2012-10-26 NOTE — ED Notes (Signed)
Insulin verified with [employer]

## 2012-10-27 ENCOUNTER — Encounter (HOSPITAL_COMMUNITY): Payer: Self-pay

## 2012-10-27 DIAGNOSIS — E872 Acidosis: Secondary | ICD-10-CM

## 2012-10-27 DIAGNOSIS — N289 Disorder of kidney and ureter, unspecified: Secondary | ICD-10-CM

## 2012-10-27 LAB — GLUCOSE, CAPILLARY
Glucose-Capillary: 90 mg/dL (ref 70–99)
Glucose-Capillary: 108 mg/dL — ABNORMAL HIGH (ref 70–99)
Glucose-Capillary: 209 mg/dL — ABNORMAL HIGH (ref 70–99)
Glucose-Capillary: 217 mg/dL — ABNORMAL HIGH (ref 70–99)
Glucose-Capillary: 221 mg/dL — ABNORMAL HIGH (ref 70–99)
Glucose-Capillary: 245 mg/dL — ABNORMAL HIGH (ref 70–99)
Glucose-Capillary: 255 mg/dL — ABNORMAL HIGH (ref 70–99)
Glucose-Capillary: 321 mg/dL — ABNORMAL HIGH (ref 70–99)
Glucose-Capillary: 433 mg/dL — ABNORMAL HIGH (ref 70–99)
Glucose-Capillary: 89 mg/dL (ref 70–99)
Glucose-Capillary: 105 mg/dL — ABNORMAL HIGH (ref 70–99)
Glucose-Capillary: 133 mg/dL — ABNORMAL HIGH (ref 70–99)
Glucose-Capillary: 134 mg/dL — ABNORMAL HIGH (ref 70–99)
Glucose-Capillary: 206 mg/dL — ABNORMAL HIGH (ref 70–99)
Glucose-Capillary: 147 mg/dL — ABNORMAL HIGH (ref 70–99)
Glucose-Capillary: 147 mg/dL — ABNORMAL HIGH (ref 70–99)

## 2012-10-27 LAB — BASIC METABOLIC PANEL
BUN: 14 mg/dL (ref 6–23)
BUN: 10 mg/dL (ref 6–23)
CO2: 14 mEq/L — ABNORMAL LOW (ref 19–32)
Calcium: 10.2 mg/dL (ref 8.4–10.5)
Calcium: 9.6 mg/dL (ref 8.4–10.5)
Calcium: 9.8 mg/dL (ref 8.4–10.5)
Chloride: 114 mEq/L — ABNORMAL HIGH (ref 96–112)
Chloride: 104 mEq/L (ref 96–112)
Creatinine, Ser: 0.55 mg/dL (ref 0.50–1.10)
GFR calc Af Amer: >90 mL/min (ref >90)
GFR calc Af Amer: >90 mL/min (ref >90)
GFR calc Af Amer: >90 mL/min (ref >90)
GFR calc Af Amer: >90 mL/min (ref >90)
GFR calc non Af Amer: >90 mL/min (ref >90)
GFR calc non Af Amer: >90 mL/min (ref >90)
GFR calc non Af Amer: >90 mL/min (ref >90)
GFR calc non Af Amer: >90 mL/min (ref >90)
GFR calc non Af Amer: >90 mL/min (ref >90)
Potassium: 2.9 mEq/L — ABNORMAL LOW (ref 3.5–5.1)
Potassium: 3.6 mEq/L (ref 3.5–5.1)
Potassium: 3.8 mEq/L (ref 3.5–5.1)
Potassium: 3.9 mEq/L (ref 3.5–5.1)
Sodium: 136 mEq/L (ref 135–145)
Sodium: 140 mEq/L (ref 135–145)
Sodium: 141 mEq/L (ref 135–145)
Sodium: 141 mEq/L (ref 135–145)
Sodium: 143 mEq/L (ref 135–145)

## 2012-10-27 LAB — BLOOD GAS, ARTERIAL
Acid-Base Excess: 0 mmol/L (ref 0.0–2.0)
Drawn by: 331471
O2 Saturation: 95.7 %
Patient temperature: 98.6
pO2, Arterial: 137 mmHg — ABNORMAL HIGH (ref 80.0–100.0)

## 2012-10-27 LAB — CBC
HCT: 34.2 % — ABNORMAL LOW (ref 36.0–46.0)
Platelets: 343 10*3/uL (ref 150–400)
RDW: 13.1 % (ref 11.5–15.5)
WBC: 21.7 10*3/uL — ABNORMAL HIGH (ref 4.0–10.5)

## 2012-10-27 LAB — MAGNESIUM: Magnesium: 2 mg/dL (ref 1.5–2.5)

## 2012-10-27 MED ORDER — INSULIN GLARGINE 100 UNIT/ML ~~LOC~~ SOLN
10.0000 [IU] | Freq: Every day | SUBCUTANEOUS | Status: DC
  Administered 2012-10-27: 10 [IU] via SUBCUTANEOUS
  Filled 2012-10-27: qty 0.1

## 2012-10-27 MED ORDER — SODIUM PHOSPHATE 3 MMOLE/ML IV SOLN
20.0000 mmol | Freq: Once | INTRAVENOUS | Status: AC
  Administered 2012-10-27: 20 mmol via INTRAVENOUS
  Filled 2012-10-27: qty 6.67

## 2012-10-27 MED ORDER — POTASSIUM CHLORIDE 10 MEQ/100ML IV SOLN
10.0000 meq | INTRAVENOUS | Status: AC
  Administered 2012-10-27 (×3): 10 meq via INTRAVENOUS
  Filled 2012-10-27: qty 100
  Filled 2012-10-27: qty 200

## 2012-10-27 MED ORDER — SODIUM CHLORIDE 0.9 % IV SOLN
INTRAVENOUS | Status: DC
  Administered 2012-10-27: 1.5 [IU]/h via INTRAVENOUS
  Filled 2012-10-27 (×2): qty 1

## 2012-10-27 MED ORDER — INSULIN ASPART 100 UNIT/ML ~~LOC~~ SOLN
2.0000 [IU] | SUBCUTANEOUS | Status: DC
  Administered 2012-10-27: 2 [IU] via SUBCUTANEOUS

## 2012-10-27 MED ORDER — POTASSIUM CHLORIDE CRYS ER 20 MEQ PO TBCR
40.0000 meq | EXTENDED_RELEASE_TABLET | Freq: Once | ORAL | Status: AC
  Administered 2012-10-27: 40 meq via ORAL
  Filled 2012-10-27: qty 2

## 2012-10-27 NOTE — Progress Notes (Signed)
eLink Physician-Brief Progress Note Patient Name: RYLIEGH SEVCIK DOB: 10/02/1990 MRN: [id number]  Date of Service  10/27/2012   HPI/Events of Note     eICU Interventions  Hypokalemia -repleted    Intervention Category Intermediate Interventions: Electrolyte abnormality - evaluation and management  ALVA,RAKESH V. 10/27/2012, 7:09 PM

## 2012-10-27 NOTE — Progress Notes (Signed)
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 No fever, chills, N/V, recent sexual activity, currently on her period, no burning upon urination or rashes to indicate infection. Patient is unwilling to talk, no eye contact.  RN states pt wants to eat. Inpatient Diabetes team very familiar with pt from multiple admissions. Transitioned off of GlucoStabilizer to SQ Lantus and Novolog.  Results for PRECIOUS, BOCCIO (MRN [id number]) as of 10/27/2012 14:58  Ref. Range 10/27/2012 05:16 10/27/2012 06:05 10/27/2012 07:02 10/27/2012 08:07 10/27/2012 11:35  Glucose-Capillary Latest Range: 70-99 mg/dL 140 (H) 147 (H) 133 (H) 134 (H) 120 (H)  Results for ENSLEY, HENNEBERGER (MRN [id number]) as of 10/27/2012 14:58  Ref. Range 08/05/2012 16:42  Hemoglobin A1C Latest Range: <5.7 % 17.6 (H)  Results for ZELDA, DENK (MRN [id number]) as of 10/27/2012 14:58  Ref. Range 10/27/2012 10:50  Sodium Latest Range: 135-145 mEq/L 140  Potassium Latest Range: 3.5-5.1 mEq/L 3.5  Chloride Latest Range: 96-112 mEq/L 108  CO2 Latest Range: 19-32 mEq/L 17 (L)  BUN Latest Range: 6-23 mg/dL 13  Creatinine Latest Range: 0.50-1.10 mg/dL 0.69  Calcium Latest Range: 8.4-10.5 mg/dL 10.2  GFR calc non Af Amer Latest Range: >90 mL/min >90  GFR calc Af Amer Latest Range: >90 mL/min >90  Glucose Latest Range: 70-99 mg/dL 109 (H)   AG is 15.   Consider restarting GlucoStabilizer until [employer] closed.  Thank you. Lorenda Peck, RD, LDN, CDE Inpatient Diabetes Coordinator [phone]

## 2012-10-27 NOTE — Progress Notes (Signed)
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  No fever, chills, N/V, recent sexual activity, currently on her period, no burning upon urination or rashes to indicate infection.  Patient is unwilling to talk much to was unable to get much history beyond above.  SIGNIFICANT EVENTS / STUDIES:  DKA 6/29   LINES / TUBES: PIV  CULTURES: Blood 6/29>>> Urine 6/29>>>  ANTIBIOTICS: None  SUBJECTIVE:  Transitioned off insulin gtt this am Sleepy, lethargic does wake to voice.   VITAL SIGNS: Temp:  [96.9 F (36.1 C)-98.9 F (37.2 C)] 98.6 F (37 C) (06/30 0800) Pulse Rate:  [101-147] 111 (06/30 0800) Resp:  [19-43] 24 (06/30 0800) BP: (91-133)/(45-89) 105/67 mmHg (06/30 0800) SpO2:  [96 %-100 %] 98 % (06/30 0800) Weight:  [51.3 kg (113 lb 1.5 oz)] 51.3 kg (113 lb 1.5 oz) (06/30 0300) HEMODYNAMICS:   VENTILATOR SETTINGS:   INTAKE / OUTPUT: Intake/Output     06/29 0701 - 06/30 0700 06/30 0701 - 07/01 0700   I.V. (mL/kg) 1447 (28.2)    Total Intake(mL/kg) 1447 (28.2)    Urine (mL/kg/hr) 1525    Total Output 1525     Net -78          Stool Occurrence       PHYSICAL EXAMINATION: General:  Thin female, very sleepy Neuro:  Wakes but not answering questions.  Moving all ext to command. HEENT:  [place]/AT, PERRL, EOM-I and DMM. Cardiovascular:  Tachy, regular, Nl S1/S2, -M/R/G. Lungs:  CTA bilaterally. Abdomen:  Soft, NT, ND and +BS. Musculoskeletal:  -edema and -tenderness. Skin:  Intact, no rashes.  LABS  Recent Labs Lab 10/26/12 1308 10/26/12 1444 10/26/12 1705 10/26/12 2350 10/27/12 0345  NA 144 146* 144 143 141  K 5.0 4.8 4.2 3.8 3.9   CL 110 112 113* 114* 114*  CO2 <7* <7* 9* 17* 14*  GLUCOSE 476* 329* 250* 100* 117*  BUN 21 20 18 14 14   CREATININE 0.94 0.92 0.80 0.61 0.63  CALCIUM 9.8 10.0 9.9 9.8 9.5  MG  --   --   --   --  2.0  PHOS  --   --   --   --  1.8*    Recent Labs Lab 10/26/12 0820 10/27/12 0345  HGB 13.8 11.9*  HCT 43.4 34.2*  WBC 39.2* 21.7*  PLT 503* 343    Recent Labs Lab 10/26/12 0809  PHART 6.993*  PCO2ART 8.0*  PO2ART 137.0*  HCO3 1.8*  TCO2 1.8  O2SAT 95.7     Recent Labs Lab 10/27/12 0306 10/27/12 0410 10/27/12 0516 10/27/12 0605 10/27/12 0702  GLUCAP 105* 103* 140* 147* 133*    CXR: no infiltrates  ASSESSMENT / PLAN:  PULMONARY A: No active issues. P:   - Sat monitor. - O2 if needed.  CARDIOVASCULAR A: Sinus tach due to DKA and hypovolemia, resolved P:  - Fluid resuscitate.  RENAL A:  Hyperkalemia, improved P:   - Hydrate. - follow BMET q6h for now  GASTROINTESTINAL A:  No active issues. P:   - NPO until DKA resolves.  HEMATOLOGIC A:  Leukocytosis, likely hemoconcentration since Hg  and Plat are also elevated as well as stress response. Improving P:  - Hydrate. - follow for signs infxn  INFECTIOUS A:  No evidence of infection. P:   - Pan culture pending - Hold off abx for now since no signs of active infection  ENDOCRINE A:  IDDM with DKA (ran out of insulin) P:   - insulin gtt stopped but concerned that it may need to be restarted with AG 13 and dropping CO2; recheck BMP now and restart gtt if trend continues.  - Will need diabetic educator when stable.   NEUROLOGIC A:  No active issues. P:   - Monitor.  TODAY'S SUMMARY: DKA protocol.  I have personally obtained a history, examined the patient, evaluated laboratory and imaging results, formulated the assessment and plan and placed orders.  Baltazar Apo, MD, PhD 10/27/2012, 9:52 AM [place] [facility] [phone] or if no answer [phone]

## 2012-10-27 NOTE — Progress Notes (Signed)
INITIAL NUTRITION ASSESSMENT  DOCUMENTATION CODES Per approved criteria  -Not Applicable   INTERVENTION: - Will continue to monitor   NUTRITION DIAGNOSIS: Altered nutrient related lab values related to DKA as evidenced by H&P.   Goal: 1. CBGs WNL 2. Pt to consume >90% of meals  Monitor:  Weights, labs, intake, CBGs  Reason for Assessment: Nutrition risk   22 y.o. female  Admitting Dx: DKA  ASSESSMENT:  Pt with insulin dependent diabetes, admitted with DKA. Pt asleep during RD visit, did not speak but just shook head "yes" or "no" to respond to questions. Pt reports not eating well PTA, no response when asked if she has been losing weight. Pt seen by Diabetes Coordinator who noted pt with HbA1c 17.6% in April 2014. Pt with elevated Alk phos, CBGs elevated but trending down.   Height: Ht Readings from Last 1 Encounters:  09/27/12 5\' 4"  (1.626 m)    Weight: Wt Readings from Last 1 Encounters:  10/27/12 113 lb 1.5 oz (51.3 kg)    Ideal Body Weight: 120 lb  % Ideal Body Weight: 94%  Wt Readings from Last 10 Encounters:  10/27/12 113 lb 1.5 oz (51.3 kg)  09/27/12 122 lb 9.2 oz (55.6 kg)  09/26/12 118 lb 13.3 oz (53.9 kg)  08/05/12 128 lb 12.8 oz (58.423 kg)  05/07/12 127 lb 10.3 oz (57.9 kg) (49%*, Z = -0.03)  03/20/12 134 lb 14.7 oz (61.2 kg) (62%*, Z = 0.30)  02/18/12 121 lb 0.5 oz (54.9 kg) (36%*, Z = -0.35)  02/07/12 123 lb 9.6 oz (56.065 kg) (42%*, Z = -0.21)  11/23/11 123 lb (55.792 kg) (41%*, Z = -0.22)  11/06/11 122 lb (55.339 kg) (39%*, Z = -0.27)   * Growth percentiles are based on CDC 2-20 Years data.    Usual Body Weight: 118-134 lb  % Usual Body Weight: 84-96%  BMI:  Body mass index is 19.4 kg/(m^2).  Estimated Nutritional Needs: Kcal: 1550-1750 Protein: 50-60g Fluid: 1.5-1.7L/day  Skin: Intact  Diet Order: Carb Control  EDUCATION NEEDS: -No education needs identified at this time   Intake/Output Summary (Last 24 hours) at 10/27/12  1632 Last data filed at 10/27/12 1500  Gross per 24 hour  Intake 1305.47 ml  Output   1525 ml  Net -219.53 ml    Last BM: PTA  Labs:   Recent Labs Lab 10/26/12 2350 10/27/12 0345 10/27/12 1050  NA 143 141 140  K 3.8 3.9 3.5  CL 114* 114* 108  CO2 17* 14* 17*  BUN 14 14 13   CREATININE 0.61 0.63 0.69  CALCIUM 9.8 9.5 10.2  MG  --  2.0  --   PHOS  --  1.8*  --   GLUCOSE 100* 117* 109*    CBG (last 3)   Recent Labs  10/27/12 1402 10/27/12 1510 10/27/12 1615  GLUCAP 238* 224* 147*    Scheduled Meds: . heparin  5,000 Units Subcutaneous Q8H  . pantoprazole (PROTONIX) IV  40 mg Intravenous Q24H  . sodium phosphate  Dextrose 5% IVPB  20 mmol Intravenous Once    Continuous Infusions: . dextrose 5 % and 0.45% NaCl 125 mL/hr at 10/27/12 1550  . insulin (NOVOLIN-R) infusion      Past Medical History  Diagnosis Date  . Gonorrhea 08/2011    Treated in 09/2011  . Diabetes mellitus 2001    Diagnosed at age 32 ; Type I    Past Surgical History  Procedure Laterality Date  . No past  surgeries       Mikey College MS, [place], [place] Pager [phone] After Hours Pager

## 2012-10-28 ENCOUNTER — Other Ambulatory Visit: Payer: Self-pay

## 2012-10-28 LAB — CBC
MCH: 28.5 pg (ref 26.0–34.0)
MCHC: 34.2 g/dL (ref 30.0–36.0)
Platelets: 278 10*3/uL (ref 150–400)
RBC: 3.62 MIL/uL — ABNORMAL LOW (ref 3.87–5.11)
RDW: 13.7 % (ref 11.5–15.5)
WBC: 12 10*3/uL — ABNORMAL HIGH (ref 4.0–10.5)

## 2012-10-28 LAB — TROPONIN I: Troponin I: <0.3 ng/mL (ref <0.30)

## 2012-10-28 LAB — GLUCOSE, CAPILLARY
Glucose-Capillary: 234 mg/dL — ABNORMAL HIGH (ref 70–99)
Glucose-Capillary: 100 mg/dL — ABNORMAL HIGH (ref 70–99)
Glucose-Capillary: 78 mg/dL (ref 70–99)
Glucose-Capillary: 156 mg/dL — ABNORMAL HIGH (ref 70–99)
Glucose-Capillary: 147 mg/dL — ABNORMAL HIGH (ref 70–99)
Glucose-Capillary: 126 mg/dL — ABNORMAL HIGH (ref 70–99)
Glucose-Capillary: 185 mg/dL — ABNORMAL HIGH (ref 70–99)
Glucose-Capillary: 254 mg/dL — ABNORMAL HIGH (ref 70–99)
Glucose-Capillary: 301 mg/dL — ABNORMAL HIGH (ref 70–99)

## 2012-10-28 LAB — BASIC METABOLIC PANEL
CO2: 18 mEq/L — ABNORMAL LOW (ref 19–32)
Calcium: 8.6 mg/dL (ref 8.4–10.5)
Chloride: 109 mEq/L (ref 96–112)
Creatinine, Ser: 0.64 mg/dL (ref 0.50–1.10)
GFR calc Af Amer: >90 mL/min (ref >90)
Glucose, Bld: 136 mg/dL — ABNORMAL HIGH (ref 70–99)
Sodium: 137 mEq/L (ref 135–145)

## 2012-10-28 LAB — URINE CULTURE

## 2012-10-28 LAB — PATHOLOGIST SMEAR REVIEW

## 2012-10-28 MED ORDER — GI COCKTAIL ~~LOC~~
30.0000 mL | Freq: Two times a day (BID) | ORAL | Status: DC | PRN
  Administered 2012-10-28: 30 mL via ORAL
  Filled 2012-10-28 (×2): qty 30

## 2012-10-28 MED ORDER — PANTOPRAZOLE SODIUM 40 MG PO TBEC
40.0000 mg | DELAYED_RELEASE_TABLET | Freq: Every day | ORAL | Status: DC
  Administered 2012-10-29: 40 mg via ORAL
  Filled 2012-10-28: qty 1

## 2012-10-28 MED ORDER — INSULIN ASPART 100 UNIT/ML ~~LOC~~ SOLN
0.0000 [IU] | Freq: Three times a day (TID) | SUBCUTANEOUS | Status: DC
  Administered 2012-10-28: 5 [IU] via SUBCUTANEOUS
  Administered 2012-10-28: 7 [IU] via SUBCUTANEOUS
  Administered 2012-10-29: 9 [IU] via SUBCUTANEOUS
  Administered 2012-10-29: 3 [IU] via SUBCUTANEOUS

## 2012-10-28 MED ORDER — INSULIN GLARGINE 100 UNIT/ML ~~LOC~~ SOLN
15.0000 [IU] | Freq: Two times a day (BID) | SUBCUTANEOUS | Status: DC
  Administered 2012-10-28 – 2012-10-29 (×3): 15 [IU] via SUBCUTANEOUS
  Filled 2012-10-28 (×4): qty 0.15

## 2012-10-28 MED ORDER — POTASSIUM CHLORIDE CRYS ER 20 MEQ PO TBCR
20.0000 meq | EXTENDED_RELEASE_TABLET | ORAL | Status: AC
  Administered 2012-10-28 (×2): 20 meq via ORAL
  Filled 2012-10-28 (×2): qty 1

## 2012-10-28 MED ORDER — CEPHALEXIN 500 MG PO CAPS
500.0000 mg | ORAL_CAPSULE | Freq: Two times a day (BID) | ORAL | Status: DC
  Administered 2012-10-28 – 2012-10-29 (×3): 500 mg via ORAL
  Filled 2012-10-28 (×4): qty 1

## 2012-10-28 NOTE — Progress Notes (Signed)
No further c/o chest pain after GI cocktail given. Noreene Larsson RN

## 2012-10-28 NOTE — Significant Event (Signed)
Called by staff nurse re: chest pain. Low prob this is cardiac related given age but w/ her DM will cycle one set of CEs and check 12-lead. 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   Marni Griffon ACNP-BC [place] Pager # [phone] OR # [phone] if no answer

## 2012-10-28 NOTE — Progress Notes (Signed)
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  No fever, chills, N/V, recent sexual activity, currently on her period, no burning upon urination or rashes to indicate infection.  Patient is unwilling to talk much to was unable to get much history beyond above.  SIGNIFICANT EVENTS / STUDIES:  DKA 6/29   LINES / TUBES: PIV  CULTURES: Blood 6/29>>> Urine 6/29>>>Group B strep  ANTIBIOTICS: Keflex 7/1>>>  SUBJECTIVE:  Transitioned off insulin gtt this am Sleepy, lethargic does wake to voice.   VITAL SIGNS: Temp:  [98 F (36.7 C)-98.6 F (37 C)] 98.1 F (36.7 C) (07/01 0800) Pulse Rate:  [97-116] 97 (07/01 0400) Resp:  [12-26] 26 (07/01 0400) BP: (98-132)/(59-104) 98/59 mmHg (07/01 0400) SpO2:  [99 %-100 %] 99 % (07/01 0400) Weight:  [55.8 kg (123 lb 0.3 oz)] 55.8 kg (123 lb 0.3 oz) (07/01 0400)  INTAKE / OUTPUT: Intake/Output     06/30 0701 - 07/01 0700 07/01 0701 - 07/02 0700   P.O. 240    I.V. (mL/kg) 2225.3 (39.9)    IV Piggyback 556.7    Total Intake(mL/kg) 3021.9 (54.2)    Urine (mL/kg/hr) 1000 (0.7)    Total Output 1000     Net +2021.9            PHYSICAL EXAMINATION: General:  Thin female, very sleepy Neuro:  Wakes but not answering questions.  Moving all ext to command. HEENT:  [place]/AT, PERRL, EOM-I and DMM. Cardiovascular:  Tachy, regular, Nl S1/S2, -M/R/G. Lungs:  CTA bilaterally. Abdomen:  Soft, NT, ND and +BS. Musculoskeletal:  -edema and -tenderness. Skin:  Intact, no rashes.  LABS  Recent Labs Lab 10/27/12 0345 10/27/12 1050 10/27/12 1615 10/27/12 2231 10/28/12 0405  NA 141 140 141 136 137  K 3.9 3.5 2.9*  3.6 3.3*  CL 114* 108 108 104 109  CO2 14* 17* 18* 18* 18*  GLUCOSE 117* 109* 133* 147* 136*  BUN 14 13 10 9 8   CREATININE 0.63 0.69 0.64 0.55 0.64  CALCIUM 9.5 10.2 9.6 9.2 8.6  MG 2.0  --   --   --   --   PHOS 1.8*  --   --   --   --     Recent Labs Lab 10/26/12 0820 10/27/12 0345 10/28/12 0405  HGB 13.8 11.9* 10.3*  HCT 43.4 34.2* 30.1*  WBC 39.2* 21.7* 12.0*  PLT 503* 343 278    Recent Labs Lab 10/26/12 0809  PHART 6.993*  PCO2ART 8.0*  PO2ART 137.0*  HCO3 1.8*  TCO2 1.8  O2SAT 95.7     Recent Labs Lab 10/28/12 0412 10/28/12 0522 10/28/12 0626 10/28/12 0737 10/28/12 0837  GLUCAP 135* 156* 147* 126* 126*    CXR: no infiltrates  ASSESSMENT / PLAN:  PULMONARY A: No active issues. P:   - Sat monitor. - O2 if needed.  CARDIOVASCULAR A: Sinus tach due to DKA and hypovolemia, resolved P:  - Continue telemetry  RENAL A:  Hyperkalemia, resolved      Hypokalemia P:   - daily BMP - replete potassium as needed   GASTROINTESTINAL A:  No active issues. P:   -  Advance diet to carb controlled  HEMATOLOGIC A:  Leukocytosis, likely hemoconcentration since Hg and Plat are also elevated as well as stress response. Improving P:  - Trend CBC  INFECTIOUS A:  UTI-group B strep P:   - Start 5 day course of Keflex 7/1  ENDOCRINE A:  IDDM with DKA (ran out of insulin) P:   - Transition to home dose glargine - Novolog SSI - Diabetic educator   NEUROLOGIC A:  No active issues. P:   - Monitor.  TODAY'S SUMMARY: [name] has closed her anion gap and is ready to transition from IV to SQ insulin. Has Group B strep in her urine, will cover with a short course of oral antibiotics. Can go to med surg today.   [facility] S-ACNP  Baltazar Apo, MD, PhD 10/28/2012, 11:15 AM [facility] [phone] or if no answer [phone]

## 2012-10-28 NOTE — Progress Notes (Signed)
Pt c/o chest pain. Vitals had just been taken by NT, WNL. This RN auscultated pts Heart Sounds, WNL. Salvadore Dom NP notified, orders received. This RN to continue to monitor. Noreene Larsson RN

## 2012-10-28 NOTE — Progress Notes (Signed)
[facility] ADULT ICU REPLACEMENT PROTOCOL FOR AM LAB REPLACEMENT ONLY  The patient does apply for the [facility] Adult ICU Electrolyte Replacment Protocol based on the criteria listed below:   1. Is GFR >/= 40 ml/min? yes  Patient's GFR today is >90 2. Is urine output >/= 0.5 ml/kg/hr for the last 6 hours? yes Patient's UOP is 3.0 ml/kg/hr 3. Is BUN < 60 mg/dL? yes  Patient's BUN today is 8 4. Abnormal electrolyte(s): K 3.3 5. Ordered repletion with: per protocol 6. If a panic level lab has been reported, has the CCM MD in charge been notified? yes.   Physician:  Dr Jimmy Footman  Orvil Feil, Taejon Irani A 10/28/2012 5:07 AM

## 2012-10-28 NOTE — Progress Notes (Signed)
Inpatient Diabetes Program Recommendations  AACE/ADA: New Consensus Statement on Inpatient Glycemic Control (2013)  Target Ranges:  Prepandial:   less than 140 mg/dL      Peak postprandial:   less than 180 mg/dL (1-2 hours)      Critically ill patients:  140 - 180 mg/dL   Reason for Visit: DKA  Results for [name], [name] (MRN [id number]) as of 10/28/2012 17:02  Ref. Range 10/28/2012 06:26 10/28/2012 07:37 10/28/2012 08:37 10/28/2012 09:32 10/28/2012 11:29  Glucose-Capillary Latest Range: 70-99 mg/dL 147 (H) 126 (H) 126 (H) 185 (H) 254 (H)     Pt did not want to discuss diabetes, insulin, etc.  When asked what we could do to keep her out of the hospital, she stated "I'll keep myself out." Ignored questions from this Probation officer.   GlucoStabilizer discontinued and basal/bolus insulin is ordered.    Will likely need meal coverage insulin to cover CHOs in meal.  Recommend: Novolog 4 units tidwc for meal coverage insulin. Would benefit from psych consult.  Thank you. Lorenda Peck, RD, LDN, CDE Inpatient Diabetes Coordinator [phone]

## 2012-10-28 NOTE — Progress Notes (Signed)
Patient transferring to room 1301.  Report called to Judson Roch, RN.  Patient to travel by wheelchair.  Will continue to monitor.

## 2012-10-29 LAB — CBC
MCH: 28.6 pg (ref 26.0–34.0)
MCV: 83.1 fL (ref 78.0–100.0)
Platelets: 260 10*3/uL (ref 150–400)
RBC: 3.85 MIL/uL — ABNORMAL LOW (ref 3.87–5.11)
RDW: 13.3 % (ref 11.5–15.5)
WBC: 6.3 10*3/uL (ref 4.0–10.5)

## 2012-10-29 LAB — GLUCOSE, CAPILLARY: Glucose-Capillary: 388 mg/dL — ABNORMAL HIGH (ref 70–99)

## 2012-10-29 LAB — BASIC METABOLIC PANEL
BUN: 9 mg/dL (ref 6–23)
Chloride: 105 mEq/L (ref 96–112)
GFR calc Af Amer: >90 mL/min (ref >90)
GFR calc non Af Amer: >90 mL/min (ref >90)
Glucose, Bld: 414 mg/dL — ABNORMAL HIGH (ref 70–99)
Potassium: 3.8 mEq/L (ref 3.5–5.1)

## 2012-10-29 LAB — PHOSPHORUS: Phosphorus: 3.3 mg/dL (ref 2.3–4.6)

## 2012-10-29 MED ORDER — INSULIN GLARGINE 100 UNIT/ML SOLOSTAR PEN: 18.0000 [IU] | PEN_INJECTOR | Freq: Two times a day (BID) | SUBCUTANEOUS | Status: DC

## 2012-10-29 MED ORDER — INSULIN ASPART 100 UNIT/ML FLEXPEN: PEN_INJECTOR | SUBCUTANEOUS | Status: DC

## 2012-10-29 MED ORDER — PANTOPRAZOLE SODIUM 40 MG PO TBEC: 40.0000 mg | DELAYED_RELEASE_TABLET | Freq: Every day | ORAL | Status: DC

## 2012-10-29 MED ORDER — CEPHALEXIN 500 MG PO CAPS: 500.0000 mg | ORAL_CAPSULE | Freq: Two times a day (BID) | ORAL | Status: DC

## 2012-10-29 NOTE — Discharge Summary (Signed)
Physician Discharge Summary     Patient ID: Ashley Freeman MRN: [id number] DOB/AGE: May 17, 1990 22 y.o.  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 No fever, chills, N/V, recent sexual activity, currently on her period, no burning upon urination or rashes to indicate infection. DX eval was constant w/ DKA. She was admitted to ICU, blood and urine cultures sent, IVF resuscitation initiated as well as Insulin gtt. Her anion gap was closed as of 7/1, and she was transitioned to lantus and sliding scale insulin. UC returned demonstrating Group B strep. She was placed of Keflex for this (she has a PCN allergy: Hives), but has tolerated this without difficulty. Since transition off insulin gtt her blood glucoses have risen  CBG (last 3)   Recent Labs  10/28/12 2125 10/29/12 0732 10/29/12 1107  GLUCAP 251* 388* 229*  However her anion gap remained closed and these findings are consistent her A1C which last checked was 17.6. On further discussion she tells me that at discharge she was taking lantus 18 units bid and then sliding scale based on her carb intake. Based on this she at times does not take any of her short acting insulin and this is further complicated by the fact that she does not have a working accucheck machine to monitor her glucose.  At discharge we have provider her with a prescription to provider her with a 20$ machine and have now instructed her to  Change from a carb counting sliding scale to sliding scale based on blood glucoses.     Discharge Plan by diagnoses  Type I DM w/ DKA See discussion above.  Discharge Plan:  -continue CHO mod diet -home on lantus 18 units q 12 -instructed to start sliding scale based on Glucose NOT CHO intake  -will take novolog  as follows: 150-200: 2 units, 201-250: 3 units, 251-300: 5 units, 301-350: 7 units, 351- >/= 400: 9 units.  If glucose remains > 400 units on 2 consecutive events she has been instructed to call Dr Marigene Ehlers  -f/u w/ Dr Marigene Ehlers on 7/10 Group B strep UTI Discharge Plan: Keflex for 4 more days  Significant Hospital tests/ studies/ interventions and procedures  Consults CULTURES:  Blood 6/29>>>  Urine 6/29>>>Group B strep  ANTIBIOTICS:  Keflex 7/1>>>  Discharge Exam: BP 123/90  Pulse 96  Temp(Src) 98.1 F (36.7 C) (Oral)  Resp 16  Ht 5\' 3"  (1.6 m)  Wt 54.477 kg (120 lb 1.6 oz)  BMI 21.28 kg/m2  SpO2 100% PHYSICAL EXAMINATION:  General: Thin female, very sleepy  Neuro: Wakes but not answering questions. Moving all ext to command.  HEENT: [place]/AT, PERRL, EOM-I and DMM.  Cardiovascular: Tachy, regular, Nl S1/S2, -M/R/G.  Lungs: CTA bilaterally.  Abdomen: Soft, NT, ND and +BS.  Musculoskeletal: -edema and -tenderness.  Skin: Intact, no rashes.   Labs at discharge Lab Results  Component Value Date   CREATININE 0.50 10/29/2012   BUN 9 10/29/2012   NA 137 10/29/2012   K 3.8 10/29/2012   CL 105 10/29/2012   CO2 22 10/29/2012   Lab Results  Component Value Date   WBC 6.3 10/29/2012   HGB 11.0* 10/29/2012   HCT 32.0* 10/29/2012   MCV 83.1 10/29/2012   PLT 260 10/29/2012  Lab Results  Component Value Date   ALT 14 10/26/2012   AST 22 10/26/2012   ALKPHOS 131* 10/26/2012   BILITOT 0.2* 10/26/2012   No results found for this basename: INR,  PROTIME    Current radiology studies No results found.  Disposition:  01-Home or Self Care      Discharge Orders   Future Appointments Provider Department Dept Phone   11/06/2012 10:30 AM Cresenciano Genre, MD [place] [phone]   11/06/2012 11:30 AM [place], [place] [phone]   Future Orders Complete By Expires     Diet - low sodium heart healthy  As directed     Comments:       Carb modified    Discharge instructions  As directed     Comments:      Check your glucose three times a day before meals: for Glucose: 150-200: 2 units, 201-250: 3 units, 251-300: 5 units, 301-350: 7 units, 351- >/= 400: 9 units. If glucose remains > 400 units on 2 consecutive events Call Dr Marigene Ehlers    Increase activity slowly  As directed         Medication List    STOP taking these medications       insulin aspart 100 UNIT/ML injection  Commonly known as:  novoLOG  Replaced by:  insulin aspart 100 UNIT/ML Sopn FlexPen     insulin glargine 100 UNIT/ML injection  Commonly known as:  LANTUS  Replaced by:  Insulin Glargine 100 UNIT/ML Sopn      TAKE these medications       cephALEXin 500 MG capsule  Commonly known as:  KEFLEX  Take 1 capsule (500 mg total) by mouth every 12 (twelve) hours.     glucose monitoring kit monitoring kit  1 each by Does not apply route as needed for other.     insulin aspart 100 UNIT/ML Sopn FlexPen  Commonly known as:  NOVOLOG FLEXPEN  Check glucose three times a day before meals: 150-200: 2 units, 201-250: 3 units, 251-300: 5 units, 301-350: 7 units, 351- >/= 400: 9 units. If glucose remains > 400 units on 2 consecutive events she has been     Insulin Glargine 100 UNIT/ML Sopn  Commonly known as:  LANTUS SOLOSTAR  Inject 18 Units into the skin every 12 (twelve) hours.     pantoprazole 40 MG tablet  Commonly known as:  PROTONIX  Take 1 tablet (40 mg total) by mouth daily.       Follow-up Information   Follow up with Cresenciano Genre, MD On 11/06/2012. (1030 am )    Contact information:   [address] [place] [address] [phone]       Discharged Condition: good  Physician Statement:   The Patient was personally examined, the discharge assessment and plan has been personally reviewed and I agree with ACNP Babcock's assessment and plan. > 30 minutes of time have been dedicated to discharge assessment, planning  and discharge instructions.   SignedMarni Griffon 10/29/2012, 11:17 AM  Baltazar Apo, MD, PhD 10/29/2012, 11:45 AM [facility] [phone] or if no answer [phone]

## 2012-10-29 NOTE — Care Management Note (Signed)
Cm spoke with patient concerning discharge planning. MD concerned of patient's compliance with Dm management and need of glucometer. Cm spoke with patient [facility] for glucometer with cost of 19.98 at [facility] outpatient pharmacy. Pt states that cost is the same at [facility], she is able to afford cost at her pharmacy and does not require new rx for glucometer. Patient states able to afford co-pay of insulin and test strips. Pt lives home with parents. No other needs required.   Ashley Lick Kashonda Sarkisyan,RN,BSN [phone]

## 2012-10-30 ENCOUNTER — Encounter: Payer: Medicaid Other | Admitting: Internal Medicine

## 2012-11-02 LAB — CULTURE, BLOOD (ROUTINE X 2)

## 2012-11-06 ENCOUNTER — Other Ambulatory Visit: Payer: Self-pay

## 2012-11-06 ENCOUNTER — Encounter: Payer: Medicaid Other | Admitting: Internal Medicine

## 2012-11-06 ENCOUNTER — Encounter: Payer: Medicaid Other | Admitting: Dietician

## 2012-11-13 ENCOUNTER — Encounter: Payer: Medicaid Other | Admitting: Internal Medicine

## 2012-11-14 ENCOUNTER — Encounter: Payer: Self-pay | Admitting: Dietician

## 2012-11-14 ENCOUNTER — Ambulatory Visit (INDEPENDENT_AMBULATORY_CARE_PROVIDER_SITE_OTHER): Payer: Medicaid Other | Admitting: Internal Medicine

## 2012-11-14 ENCOUNTER — Encounter: Payer: Self-pay | Admitting: Internal Medicine

## 2012-11-14 ENCOUNTER — Ambulatory Visit: Payer: Medicaid Other | Admitting: Dietician

## 2012-11-14 VITALS — BP 115/69 | HR 102 | Temp 97.9°F | Ht 62.0 in | Wt 117.2 lb

## 2012-11-14 DIAGNOSIS — E109 Type 1 diabetes mellitus without complications: Secondary | ICD-10-CM

## 2012-11-14 DIAGNOSIS — E108 Type 1 diabetes mellitus with unspecified complications: Secondary | ICD-10-CM

## 2012-11-14 DIAGNOSIS — R739 Hyperglycemia, unspecified: Secondary | ICD-10-CM

## 2012-11-14 DIAGNOSIS — E1065 Type 1 diabetes mellitus with hyperglycemia: Secondary | ICD-10-CM

## 2012-11-14 DIAGNOSIS — R7309 Other abnormal glucose: Secondary | ICD-10-CM

## 2012-11-14 LAB — POCT GLYCOSYLATED HEMOGLOBIN (HGB A1C): Hemoglobin A1C: >14

## 2012-11-14 MED ORDER — INSULIN ASPART 100 UNIT/ML FLEXPEN: PEN_INJECTOR | SUBCUTANEOUS | Status: DC

## 2012-11-14 NOTE — Patient Instructions (Addendum)
General Instructions: Please follow up in 1 month for your diabetes  Ashley Freeman (diabetes educator will call you) and Ashley Freeman will call you (social worker) Please take medications as instructed  Keep appt with eye MD  Type 1 Diabetes Mellitus, Adult Type 1 diabetes mellitus, often simply referred to as diabetes, is a long-term (chronic) disease. It occurs when the islet cells in the pancreas that make insulin (a hormone) are destroyed and can no longer make insulin. Insulin is needed to move sugars from food into the tissue cells. The tissue cells use the sugars for energy. In people with type 1 diabetes, the sugars build up in the blood instead of going into the tissue cells. As a result, high blood sugar (hyperglycemia) develops. Without insulin, the body breaks down fat cells for the needed energy. This breakdown of fat cells produces acid chemicals (ketones), which increases the acid levels in the body. The effect of either high ketone or sugar (glucose) levels can be life-threatening.  Type 1 diabetes was also previously called juvenile diabetes. It most often occurs before the age of 26, but it can occur at any age. RISK FACTORS A person is predisposed to developing type 1 diabetes if someone in his or her family has the disease and is exposed to certain additional environmental triggers.  SYMPTOMS  Symptoms of type 1 diabetes may develop gradually over days to weeks or suddenly. The symptoms occur due to hyperglycemia. The symptoms can include:   Increased thirst (polydipsia).  Increased urination (polyuria).  Increased urination during the night (nocturia).  Weight loss. This weight loss may be rapid.  Frequent, recurring infections.  Tiredness (fatigue).  Weakness.  Vision changes, such as blurred vision.  Fruity smell to your breath.  Abdominal pain.  Nausea or vomiting. DIAGNOSIS  Type 1 diabetes is diagnosed when symptoms of diabetes are present and when blood glucose  levels are increased. Your blood glucose level may be checked by one or more of the following blood tests:  A fasting blood glucose test. You will not be allowed to eat for at least 8 hours before a blood sample is taken.  A random blood glucose test. Your blood glucose is checked at any time of the day regardless of when you ate.  A hemoglobin A1c blood glucose test. A hemoglobin A1c test provides information about blood glucose control over the previous 3 months. TREATMENT  Although type 1 diabetes cannot be prevented, it can be managed with insulin, diet, and exercise.  You will need to take insulin daily to keep blood glucose in the desired range.  You will need to match insulin dosing with exercise and healthy food choices. The treatment goal is to maintain the before-meal blood sugar (preprandial glucose) level at 70 130 mg/dL.  HOME CARE INSTRUCTIONS   Have your hemoglobin A1c level checked twice a year.  Perform daily blood glucose monitoring as directed by your caregiver.  Monitor urine ketones when you are ill and as directed by your caregiver.  Take your insulin as directed by your caregiver to maintain your blood glucose level in the desired range.  Never run out of insulin. It is needed every day.  Adjust insulin based on your intake of carbohydrates. Carbohydrates can raise blood glucose levels but need to be included in your diet. Carbohydrates provide vitamins, minerals, and fiber, which are an essential part of a healthy diet. Carbohydrates are found in fruits, vegetables, whole grains, dairy products, legumes, and foods containing added sugars.  Eat healthy foods. Alternate 3 meals with 3 snacks.  Maintain a healthy weight.  Carry a medical alert card or wear your medical alert jewelry.  Carry a 15 gram carbohydrate snack with you at all times to treat low blood glucose (hypoglycemia). Some examples of 15 gram carbohydrate snacks include:  Glucose tablets,  3 or 4.   Glucose gel, 15 gram tube.  Raisins, 2 tablespoons (24 grams).  Jelly beans, 6.  Animal crackers, 8.  Fruit juice, regular soda, or low-fat milk, 4 ounces (120 mL).  Gummy treats, 9.    Recognize hypoglycemia. Hypoglycemia occurs with blood glucose levels of 70 mg/dL and below. The risk for hypoglycemia increases when fasting or skipping meals, during or after intense exercise, and during sleep. Hypoglycemia symptoms can include:  Tremors or shakes.  Decreased ability to concentrate.  Sweating.  Increased heart rate.  Headache.  Dry mouth.  Hunger.  Irritability.  Anxiety.  Restless sleep.  Altered speech or coordination.  Confusion.  Treat hypoglycemia promptly. If you are alert and able to safely swallow, follow the 15:15 rule:  Take 15 20 grams of rapid-acting glucose or carbohydrate. Rapid-acting options include glucose gel, glucose tablets, or 4 ounces (120 mL) of fruit juice, regular soda, or low-fat milk.  Check your blood glucose level 15 minutes after taking the glucose.   Take 15 20 grams more of glucose if the repeat blood glucose level is still 70 mg/dL or below.  Eat a meal or snack within 1 hour once blood glucose levels return to normal.  Be alert to polyuria and polydipsia, which are early signs of hyperglycemia. An early awareness of hyperglycemia allows for prompt treatment. Treat hyperglycemia as directed by your caregiver.  Engage in at least 150 minutes of moderate-intensity physical activity a week, spread over at least 3 days of the week or as directed by your caregiver.  Adjust your insulin dosing and food intake as needed if you start a new exercise or sport.  Follow your sick day plan at any time you are unable to eat or drink as usual.   Avoid tobacco use.  Limit alcohol intake to no more than 1 drink per day for nonpregnant women and 2 drinks per day for men. You should drink alcohol only when you are also eating  food. Talk with your caregiver about whether alcohol is safe for you. Tell your caregiver if you drink alcohol several times a week.  Follow up with your caregiver regularly.  Schedule an eye exam within 5 years of diagnosis and then annually.  Perform daily skin and foot care. Examine your skin and feet daily for cuts, bruises, redness, nail problems, bleeding, blisters, or sores. A foot exam by a caregiver should be done annually.  Brush your teeth and gums at least twice a day and floss at least once a day. Follow up with your dentist regularly.  Share your diabetes management plan with your workplace or school.  Stay up-to-date with immunizations.  Learn to manage stress.  Obtain ongoing diabetes education and support as needed.  Participate or seek rehabilitation as needed to maintain or improve independence and quality of life. Request a physical or occupational therapy referral if you are having foot or hand numbness or difficulties with grooming, dressing, eating, or physical activity. SEEK MEDICAL CARE IF:   You are unable to eat food or drink fluids for more than 6 hours.  You have nausea and vomiting for more than 6 hours.  Your  blood glucose level is over 240 mg/dL.  There is a change in mental status.  You develop an additional serious illness.  You have diarrhea for more than 6 hours.  You have been sick or have had a fever for a couple of days and are not getting better.  You have pain during any physical activity. SEEK IMMEDIATE MEDICAL CARE IF:  You have difficulty breathing.  You have moderate to large ketone levels. MAKE SURE YOU:  Understand these instructions.  Will watch your condition.  Will get help right away if you are not doing well or get worse. Document Released: 04/13/2000 Document Revised: 01/09/2012 Document Reviewed: 11/13/2011 [facility] Patient Information 2014 [employer].     Treatment Goals:  Goals (1 Years of Data) as of  11/14/12         As of Today 10/29/12 10/29/12 10/29/12 10/28/12     Blood Pressure    . Blood Pressure < 140/90  115/69 123/90 116/73 113/73 120/86    . Blood Pressure < 140/90  115/69 123/90 116/73 113/73 120/86     Result Component    . HEMOGLOBIN A1C < 7.0          . HEMOGLOBIN A1C < 7.0          . LDL CALC < 100          . LDL CALC < 100            Progress Toward Treatment Goals:  Treatment Goal 11/14/2012  Hemoglobin A1C unable to assess    Self Care Goals & Plans:  Self Care Goal 11/14/2012  Manage my medications take my medicines as prescribed; bring my medications to every visit; refill my medications on time  Monitor my health bring my glucose meter and log to each visit; keep track of my blood glucose  Eat healthy foods drink diet soda or water instead of juice or soda; eat more vegetables; eat foods that are low in salt; eat baked foods instead of fried foods; eat fruit for snacks and desserts; eat smaller portions  Meeting treatment goals maintain the current self-care plan    Home Blood Glucose Monitoring 11/14/2012  Check my blood sugar (No Data)     Care Management & Community Referrals:  Referral 11/14/2012  Referrals made for care management support diabetes educator  Referrals made to community resources (No Data)

## 2012-11-14 NOTE — Progress Notes (Unsigned)
Patient ID: Ashley Freeman, female   DOB: 1990/06/17, 22 y.o.   MRN: [id number] Met with patient briefly at physician visit and provided her with an accu chek nano meter.

## 2012-11-16 ENCOUNTER — Encounter: Payer: Self-pay | Admitting: Internal Medicine

## 2012-11-16 MED ORDER — GLUCOSE BLOOD VI STRP: ORAL_STRIP | Status: DC

## 2012-11-16 NOTE — Progress Notes (Signed)
  Subjective:    Patient ID: Ashley Freeman, female    DOB: December 31, 1990, 22 y.o.   MRN: [id number]  HPI Comments: Patient seen 7/18.  22 y.o Type 1 diabetes uncontrolled (HA1C 17.6-->>14.0 today cbg 381).  She has multiple no shows and multiple admissions for DKA.  She requests a meter today and an appt for the eye doctor.  She also brings in paperwork to try to apply for disability.  She reports taking Lantus 18 units bid and variable compliance with insulin aspart.  BP 115/69, HR 102, 97.9 F 99% room air wt 117 lbs,5'2".    SH: now with Medicaid.  Will start a job at [employer].       Review of Systems  Respiratory: Negative for shortness of breath.   Cardiovascular: Negative for chest pain.  Gastrointestinal: Negative for abdominal pain.       Objective:   Physical Exam  Nursing note and vitals reviewed. Constitutional: She is oriented to person, place, and time. Vital signs are normal. She appears well-developed and well-nourished. She is cooperative. No distress.  HENT:  Head: Normocephalic and atraumatic.  Mouth/Throat: No oropharyngeal exudate.  Eyes: Conjunctivae are normal. Right eye exhibits no discharge. Left eye exhibits no discharge. No scleral icterus.  Cardiovascular: Regular rhythm, S1 normal, S2 normal and normal heart sounds.  Tachycardia present.   No murmur heard. Pulmonary/Chest: Effort normal and breath sounds normal. No respiratory distress. She has no wheezes.  Abdominal: Soft. Bowel sounds are normal. There is no tenderness.  Neurological: She is alert and oriented to person, place, and time. Gait normal.  Skin: Skin is warm, dry and intact. No rash noted. She is not diaphoretic.  Psychiatric: She has a normal mood and affect. Her speech is normal and behavior is normal. Judgment and thought content normal. Cognition and memory are normal.          Assessment & Plan:

## 2012-11-16 NOTE — Assessment & Plan Note (Addendum)
Lab Results  Component Value Date   HGBA1C >14.0 11/14/2012   HGBA1C 17.6* 08/05/2012   HGBA1C >20.0* 05/07/2012     Assessment: Diabetes control: poor control (HgbA1C >9%) Progress toward A1C goal:  unable to assess Comments: unable to assess due to multiple no shows to appts.  but HA1C seems to be down trending  Plan: Medications:  continue current medications; changed aspart range  Home glucose monitoring: Frequency:  (needs a meter rec 3 -4 x per day) Timing: before meals  Instruction/counseling given: reminded to bring blood glucose meter & log to each visitEducational resources provided: brochure Self management tools provided: home glucose logbook  Other plans: referred for eye MD appt; patient met with DM educator today but needs another visit in the future.  DM educatior gave the patient a accucheck nano meter and MD ordered glucose test strips.   f/u 1 month with MD and Butch Penny (DM educator)    Patient brought in disability paperwork but was given information to  see social services

## 2012-11-16 NOTE — Assessment & Plan Note (Signed)
cbg 381 today likely due to medication noncompliance. Patient chronic rns hyperglycemic.    Plan Encouraged compliance

## 2012-11-18 NOTE — Progress Notes (Signed)
Case discussed with Dr. McLean at the time of the visit.  We reviewed the resident's history and exam and pertinent patient test results.  I agree with the assessment, diagnosis, and plan of care documented in the resident's note.     

## 2012-12-12 ENCOUNTER — Encounter (HOSPITAL_COMMUNITY): Payer: Self-pay | Admitting: Emergency Medicine

## 2012-12-12 ENCOUNTER — Emergency Department (INDEPENDENT_AMBULATORY_CARE_PROVIDER_SITE_OTHER)
Admission: EM | Admit: 2012-12-12 | Discharge: 2012-12-12 | Disposition: A | Discharge status: Home / Self Care | Payer: Medicaid Other | Source: Home / Self Care

## 2012-12-12 DIAGNOSIS — E1065 Type 1 diabetes mellitus with hyperglycemia: Secondary | ICD-10-CM

## 2012-12-12 DIAGNOSIS — L0291 Cutaneous abscess, unspecified: Secondary | ICD-10-CM

## 2012-12-12 LAB — GLUCOSE, CAPILLARY: Glucose-Capillary: >600 mg/dL (ref 70–99)

## 2012-12-12 MED ORDER — DOXYCYCLINE HYCLATE 100 MG PO CAPS: 100.0000 mg | ORAL_CAPSULE | Freq: Two times a day (BID) | ORAL | Status: DC

## 2012-12-12 NOTE — ED Notes (Signed)
Patient thinks it was a bug bite.  Painful area to left buttocks, draining per patient.  Patient is a diabetic, sugars have been running high, quotes sugar of "3 hundred something this am"

## 2012-12-12 NOTE — ED Provider Notes (Addendum)
Ashley Freeman is a 22 y.o. female who presents to Urgent Care today for abscess of her left buttock worsening over the last several days. It is quite painful. Patient denies any injury. She's tried some over-the-counter medications with limited somewhat helpful. She denies any change in her stool. Her past medical history significant for type 1 diabetes currently out of control with a hemoglobin A1c greater than 14 one month ago. No nausea vomiting diarrhea fever or chills currently.    PMH reviewed. Type 1 diabetes History  Substance Use Topics  . Smoking status: Never Smoker   . Smokeless tobacco: Never Used  . Alcohol Use: No   ROS as above Medications reviewed. No current facility-administered medications for this encounter.   Current Outpatient Prescriptions  Medication Sig Dispense Refill  . Blood Glucose Monitoring Suppl (ACCU-CHEK NANO SMARTVIEW) W/DEVICE KIT 1 each by Does not apply route 3 (three) times daily. Bring meter to all visits 250.03 insulin requiring      . doxycycline (VIBRAMYCIN) 100 MG capsule Take 1 capsule (100 mg total) by mouth 2 (two) times daily.  20 capsule  0  . glucose blood test strip Use as instructed  100 each  12  . insulin aspart (NOVOLOG FLEXPEN) 100 UNIT/ML SOPN FlexPen Check glucose three times a day before meals: 120-160: 2 units, 161-200: 3 units, 201-240: 4 units, 241-280: 5 units, 281- >/= 320: 7 units 321-360: 9 units 361-400: 11 units. If glucose remains > 400 units on 2 times you check it call the office  5 pen  6  . Insulin Glargine (LANTUS SOLOSTAR) 100 UNIT/ML SOPN Inject 18 Units into the skin every 12 (twelve) hours.  5 pen  6  . pantoprazole (PROTONIX) 40 MG tablet Take 1 tablet (40 mg total) by mouth daily.  30 tablet  6    Exam:  BP 124/80  Pulse 138  Temp(Src) 98.7 F (37.1 C) (Oral)  Resp 18  SpO2 98%  LMP 11/30/2012 Gen: Well NAD SKIN: Left buttocks in the perirectal area about 3 cm from the anus large area of erythema  with induration approximately 4 cm in diameter with area of fluctuance about 2 cm in diameter at the Center. Quite tender to touch. No tracking towards the anus.   Procedure note incision and drainage of abscess: Consent obtained and timeout performed. Skin overlying the abscess was cleaned with alcohol and 4 mL of lidocaine were used to provide anesthesia.  The area of fluctuance was incised and a moderate amount of pus was removed and cultured. A wooden Q-tip was used to break up further loculations and more pus was expressed.  About 6 inches of packing were inserted into the abscess cavity. The wound was dressed.  Patient tolerated procedure well  No results found for this or any previous visit (from the past 24 hour(s)). No results found. Lab Results  Component Value Date   HGBA1C >14.0 11/14/2012     Assessment and Plan: 22 y.o. female with left buttocks abscess incision and drainage complicated by uncontrolled type 1 diabetes.  The abscess was drained and packed and a culture was obtained.  Plan to treat empirically with doxycycline because of patient's relative immunocompromise status with poorly controlled diabetes.  Followup with primary care provider Discussed warning signs or symptoms. Please see discharge instructions. Patient expresses understanding.      Gregor Hams, MD 12/12/12 1554  Addendum: CBG greater than 600 just as patient was being discharged. This is likely a  result of patient's underlying poorly controlled diabetes. Additionally it is probably elevated because of her abscess.  she has switched to a new sliding scale insulin about a month ago.  Patient will followup with her primary care provider for this.  If her sugars remain elevated she will followup-pack-year the emergency room.  We discussed these prior to discharge.   Gregor Hams, MD 12/16/12 [phone]   I called the patient today. Her blood sugars are better controlled in the 200s.  Her wound  culture results came back positive for group B strep.  I called and clindamycin and canceled doxycycline.    Gregor Hams, MD 12/16/12 1110

## 2012-12-12 NOTE — ED Notes (Signed)
Assisted with i/d procedure.

## 2012-12-13 ENCOUNTER — Telehealth (HOSPITAL_COMMUNITY): Payer: Self-pay | Admitting: *Deleted

## 2012-12-13 NOTE — ED Notes (Signed)
Abnormal CBG of >600 shown to Dr. Juventino Slovak.  He said to call pt. and tell her to go to the ED if sugar still elevated.  I called pt. She said she is "good."  I asked her if she checked her sugar today and she said no. I asked her if she is supposed to check it everyday. She said "yes, but she is just waking up now." I asked her to check it now.  Held for 15 min. and pt. did not come back to the phone.  I called back and her "ex" answered the phone. He said she just left. She told him she had to handle some business. I told him I was waiting for her to check her sugar and she never came back to the phone. I told him I was concerned because her sugar was high yesterday. If it is still that high, she needs to go to the ED right away for fluids and insulin.  He said he can get in touch with her. I asked him to call me back when he contacts her. Roselyn Meier 12/13/2012

## 2012-12-14 ENCOUNTER — Telehealth (HOSPITAL_COMMUNITY): Payer: Self-pay | Admitting: *Deleted

## 2012-12-14 NOTE — ED Notes (Signed)
I called pt.'s mother and asked her to contact her about getting her sugar rechecked. If it is still high, she needs to go to the ED for fluids and insulin. Mom voiced understanding and will try to contact her. Roselyn Meier 12/14/2012

## 2012-12-15 LAB — WOUND CULTURE

## 2012-12-16 ENCOUNTER — Telehealth (HOSPITAL_COMMUNITY): Payer: Self-pay | Admitting: Family Medicine

## 2012-12-16 ENCOUNTER — Telehealth (HOSPITAL_COMMUNITY): Payer: Self-pay | Admitting: *Deleted

## 2012-12-16 MED ORDER — CLINDAMYCIN HCL 150 MG PO CAPS: 150.0000 mg | ORAL_CAPSULE | Freq: Four times a day (QID) | ORAL | Status: DC

## 2012-12-16 NOTE — ED Notes (Signed)
Called patient about her wound culture results.  I left a message.  I called in Clindamycin (she is allergic to Penicillin and doxycycline is not effective against strep) Will call patient back.   Gregor Hams, MD 12/16/12 0800

## 2012-12-16 NOTE — ED Notes (Signed)
I called pt. Pt. verified x 2 and given results.  Pt. told to stop Doxycycline and take all of Cleocin.  Pt. said she got Dr. Clovis Riley message and has picked up the medication.  I asked her what her sugar is running. She said it is in the 2's. I said the 200's and she said yes. I told her it was over 600 on 8/15.  I instructed her to check her sugar every day and take her insulin as scheduled, so she does not go into DKA again.  Pt. voiced understanding. Ashley Freeman 12/16/2012

## 2012-12-18 ENCOUNTER — Encounter: Payer: Self-pay | Admitting: Internal Medicine

## 2012-12-18 ENCOUNTER — Encounter: Payer: Medicaid Other | Admitting: Internal Medicine

## 2012-12-18 ENCOUNTER — Ambulatory Visit: Payer: Medicaid Other | Admitting: Dietician

## 2013-04-17 ENCOUNTER — Telehealth: Payer: Self-pay | Admitting: Dietician

## 2013-04-17 NOTE — Telephone Encounter (Signed)
Dr. Aundra Dubin called and left message for patient to call office to schedule an appointment.

## 2013-04-24 ENCOUNTER — Telehealth: Payer: Self-pay | Admitting: Internal Medicine

## 2013-04-24 NOTE — Telephone Encounter (Signed)
  INTERNAL MEDICINE RESIDENCY PROGRAM After-Hours Telephone Call    Reason for call:  I received a call from [name] at 11:11 AM, 04/24/2013 indicating that her glucose meter is reading "high" and she ran out of her insulin yesterday. She can not afford her insulin since her Medicaid ran out too recently. She is requesting for some free samples of Lantus as she has been assisted before from our clinic. Patient current symptoms include polyuria but no dizziness or other symptoms.     Pertinent Data:   She take Lantus insulin 20 units bid.     Assessment / Plan / Recommendations:   I advised the patient to go to the ED since our clinic is closed today. From the ED she can get some labs drawn for acute electrolyte abnormalities. I advised her to contact our clinic on Monday when we are open to see if there are any free lantus samples. She verbalized understanding.   As always, pt is advised that if symptoms worsen or new symptoms arise, they should go to an urgent care facility or to to ER for further evaluation.    Jessee Avers, MD   04/24/2013, 11:11 AM

## 2013-05-16 ENCOUNTER — Inpatient Hospital Stay (HOSPITAL_COMMUNITY)
Admission: EM | Admit: 2013-05-16 | Discharge: 2013-05-19 | DRG: 639 | Disposition: A | Discharge status: Home / Self Care | Payer: Medicaid Other | Attending: Internal Medicine | Admitting: Internal Medicine

## 2013-05-16 ENCOUNTER — Encounter (HOSPITAL_COMMUNITY): Payer: Self-pay | Admitting: Emergency Medicine

## 2013-05-16 DIAGNOSIS — Z833 Family history of diabetes mellitus: Secondary | ICD-10-CM

## 2013-05-16 DIAGNOSIS — E1110 Type 2 diabetes mellitus with ketoacidosis without coma: Secondary | ICD-10-CM | POA: Diagnosis present

## 2013-05-16 DIAGNOSIS — L089 Local infection of the skin and subcutaneous tissue, unspecified: Secondary | ICD-10-CM | POA: Diagnosis not present

## 2013-05-16 DIAGNOSIS — Z880 Allergy status to penicillin: Secondary | ICD-10-CM

## 2013-05-16 DIAGNOSIS — E108 Type 1 diabetes mellitus with unspecified complications: Secondary | ICD-10-CM

## 2013-05-16 DIAGNOSIS — H60392 Other infective otitis externa, left ear: Secondary | ICD-10-CM | POA: Diagnosis present

## 2013-05-16 DIAGNOSIS — E876 Hypokalemia: Secondary | ICD-10-CM | POA: Diagnosis present

## 2013-05-16 DIAGNOSIS — Z23 Encounter for immunization: Secondary | ICD-10-CM

## 2013-05-16 DIAGNOSIS — E860 Dehydration: Secondary | ICD-10-CM | POA: Diagnosis present

## 2013-05-16 DIAGNOSIS — R739 Hyperglycemia, unspecified: Secondary | ICD-10-CM

## 2013-05-16 DIAGNOSIS — E109 Type 1 diabetes mellitus without complications: Secondary | ICD-10-CM

## 2013-05-16 DIAGNOSIS — E1010 Type 1 diabetes mellitus with ketoacidosis without coma: Principal | ICD-10-CM | POA: Diagnosis present

## 2013-05-16 DIAGNOSIS — Z825 Family history of asthma and other chronic lower respiratory diseases: Secondary | ICD-10-CM

## 2013-05-16 DIAGNOSIS — E1065 Type 1 diabetes mellitus with hyperglycemia: Secondary | ICD-10-CM | POA: Diagnosis present

## 2013-05-16 LAB — COMPREHENSIVE METABOLIC PANEL
ALBUMIN: 4.1 g/dL (ref 3.5–5.2)
ALK PHOS: 74 U/L (ref 39–117)
ALT: 9 U/L (ref 0–35)
AST: 12 U/L (ref 0–37)
BUN: 11 mg/dL (ref 6–23)
CHLORIDE: 78 meq/L — AB (ref 96–112)
CO2: 22 meq/L (ref 19–32)
Calcium: 9.7 mg/dL (ref 8.4–10.5)
Creatinine, Ser: 0.66 mg/dL (ref 0.50–1.10)
GFR calc Af Amer: >90 mL/min (ref >90)
Glucose, Bld: 869 mg/dL (ref 70–99)
POTASSIUM: 5 meq/L (ref 3.7–5.3)
Sodium: 120 mEq/L — CL (ref 137–147)
Total Bilirubin: 0.7 mg/dL (ref 0.3–1.2)
Total Protein: 7.6 g/dL (ref 6.0–8.3)

## 2013-05-16 LAB — BLOOD GAS, VENOUS
Acid-base deficit: 1.5 mmol/L (ref 0.0–2.0)
Bicarbonate: 24.1 mEq/L — ABNORMAL HIGH (ref 20.0–24.0)
FIO2: 0.21 %
O2 SAT: 29.7 %
PATIENT TEMPERATURE: 37
TCO2: 21.9 mmol/L (ref 0–100)
pCO2, Ven: 46.3 mmHg (ref 45.0–50.0)
pH, Ven: 7.337 — ABNORMAL HIGH (ref 7.250–7.300)

## 2013-05-16 LAB — BASIC METABOLIC PANEL
BUN: 9 mg/dL (ref 6–23)
BUN: 7 mg/dL (ref 6–23)
CHLORIDE: 98 meq/L (ref 96–112)
CO2: 18 meq/L — AB (ref 19–32)
CO2: 23 meq/L (ref 19–32)
CREATININE: 0.5 mg/dL (ref 0.50–1.10)
Calcium: 8.9 mg/dL (ref 8.4–10.5)
Calcium: 8.5 mg/dL (ref 8.4–10.5)
Chloride: 93 mEq/L — ABNORMAL LOW (ref 96–112)
Creatinine, Ser: 0.52 mg/dL (ref 0.50–1.10)
GFR calc Af Amer: >90 mL/min (ref >90)
GFR calc non Af Amer: >90 mL/min (ref >90)
GFR calc non Af Amer: >90 mL/min (ref >90)
Glucose, Bld: 379 mg/dL — ABNORMAL HIGH (ref 70–99)
Glucose, Bld: 306 mg/dL — ABNORMAL HIGH (ref 70–99)
Potassium: 3.5 mEq/L — ABNORMAL LOW (ref 3.7–5.3)
Potassium: 3.2 mEq/L — ABNORMAL LOW (ref 3.7–5.3)
Sodium: 130 mEq/L — ABNORMAL LOW (ref 137–147)
Sodium: 136 mEq/L — ABNORMAL LOW (ref 137–147)

## 2013-05-16 LAB — URINALYSIS, ROUTINE W REFLEX MICROSCOPIC
Bilirubin Urine: NEGATIVE
Glucose, UA: >1000 mg/dL — AB
HGB URINE DIPSTICK: NEGATIVE
KETONES UR: 15 mg/dL — AB
Leukocytes, UA: NEGATIVE
Nitrite: NEGATIVE
PROTEIN: NEGATIVE mg/dL
Specific Gravity, Urine: 1.031 — ABNORMAL HIGH (ref 1.005–1.030)
UROBILINOGEN UA: 0.2 mg/dL (ref 0.0–1.0)
pH: 6 (ref 5.0–8.0)

## 2013-05-16 LAB — URINE MICROSCOPIC-ADD ON

## 2013-05-16 LAB — GLUCOSE, CAPILLARY
GLUCOSE-CAPILLARY: 239 mg/dL — AB (ref 70–99)
GLUCOSE-CAPILLARY: 283 mg/dL — AB (ref 70–99)
GLUCOSE-CAPILLARY: 298 mg/dL — AB (ref 70–99)
Glucose-Capillary: 497 mg/dL — ABNORMAL HIGH (ref 70–99)
Glucose-Capillary: 368 mg/dL — ABNORMAL HIGH (ref 70–99)
Glucose-Capillary: 266 mg/dL — ABNORMAL HIGH (ref 70–99)

## 2013-05-16 LAB — CBC
HCT: 38.7 % (ref 36.0–46.0)
Hemoglobin: 13.9 g/dL (ref 12.0–15.0)
MCH: 28.2 pg (ref 26.0–34.0)
MCHC: 35.9 g/dL (ref 30.0–36.0)
MCV: 78.5 fL (ref 78.0–100.0)
Platelets: 332 10*3/uL (ref 150–400)
RBC: 4.93 MIL/uL (ref 3.87–5.11)
RDW: 12.5 % (ref 11.5–15.5)
WBC: 6 10*3/uL (ref 4.0–10.5)

## 2013-05-16 LAB — POCT PREGNANCY, URINE: PREG TEST UR: NEGATIVE

## 2013-05-16 MED ORDER — SODIUM CHLORIDE 0.9 % IV BOLUS (SEPSIS)
1000.0000 mL | Freq: Once | INTRAVENOUS | Status: AC
  Administered 2013-05-16: 1000 mL via INTRAVENOUS

## 2013-05-16 MED ORDER — DEXTROSE-NACL 5-0.45 % IV SOLN: INTRAVENOUS | Status: DC

## 2013-05-16 MED ORDER — SODIUM CHLORIDE 0.9 % IV SOLN
INTRAVENOUS | Status: DC
  Administered 2013-05-16: 4.4 [IU]/h via INTRAVENOUS
  Filled 2013-05-16: qty 1

## 2013-05-16 MED ORDER — SODIUM CHLORIDE 0.9 % IV SOLN: INTRAVENOUS | Status: AC

## 2013-05-16 MED ORDER — POTASSIUM CHLORIDE IN NACL 20-0.9 MEQ/L-% IV SOLN
Freq: Once | INTRAVENOUS | Status: DC
  Filled 2013-05-16: qty 1000

## 2013-05-16 MED ORDER — SODIUM CHLORIDE 0.9 % IV SOLN
INTRAVENOUS | Status: DC
  Administered 2013-05-16: 23:00:00 via INTRAVENOUS

## 2013-05-16 MED ORDER — ENOXAPARIN SODIUM 40 MG/0.4ML ~~LOC~~ SOLN
40.0000 mg | SUBCUTANEOUS | Status: DC
  Administered 2013-05-17 (×2): 40 mg via SUBCUTANEOUS
  Filled 2013-05-16 (×4): qty 0.4

## 2013-05-16 MED ORDER — DEXTROSE 50 % IV SOLN: 25.0000 mL | INTRAVENOUS | Status: DC | PRN

## 2013-05-16 MED ORDER — DEXTROSE-NACL 5-0.45 % IV SOLN
INTRAVENOUS | Status: AC
  Administered 2013-05-16: via INTRAVENOUS

## 2013-05-16 MED ORDER — POTASSIUM CHLORIDE 10 MEQ/100ML IV SOLN
10.0000 meq | INTRAVENOUS | Status: AC
  Administered 2013-05-16 – 2013-05-17 (×2): 10 meq via INTRAVENOUS
  Filled 2013-05-16: qty 100

## 2013-05-16 MED ORDER — SODIUM CHLORIDE 0.9 % IV SOLN
INTRAVENOUS | Status: DC
  Filled 2013-05-16: qty 1

## 2013-05-16 NOTE — ED Notes (Signed)
MD would like 2nd blous to infuse before initiation of insulin

## 2013-05-16 NOTE — ED Notes (Signed)
Pt unsure how long her blood sugar has been high. She is out of her long acting insulin and can't afford her medication.

## 2013-05-16 NOTE — ED Notes (Signed)
Patient states that she has been off her period for 3 days. Wokeup this am dizzy. Denies any head trauma. Reports in the past that she has had a similar episode.

## 2013-05-16 NOTE — ED Notes (Signed)
CBG registered [id number]

## 2013-05-16 NOTE — H&P (Signed)
Date: 05/16/2013               Patient Name:  Ashley Freeman MRN: [id number]  DOB: 05/09/1990 Age / Sex: 23 y.o., female   PCP: Cresenciano Genre, MD         Medical Service: Internal Medicine Teaching Service         Attending Physician: Dr. Bartholomew Crews, MD    First Contact: Dr. Ivin Poot Pager: [phone]  Second Contact: Dr. Karlyn Agee Pager: [phone]       After Hours (After 5p/  First Contact Pager: [phone]  weekends / holidays): Second Contact Pager: [phone]   Chief Complaint: dizziness and increased thirst   History of Present Illness:  [name] is a 23 year old woman with DM type 1 (diagnosed at age 84; HgbA1c > 14.0 July 2014) who presented to the [facility] ED with 1 day complaint of dizziness, increased thirst and dry mouth.  These symptoms began last night.  She also has nausea but has not vomited.  She denies abdominal pain, recent URI, chest pain, dyspnea or dysuria.  She had a cold a few weeks ago.  Her insulin regimen is 20 units of Lantus q12h and Novolog SSI TID.  She ran out of Lantus two days ago.  Her last insulin dose was 10 units of Novolog last night. She has not been checking her BG at home.    In the [facility] ED:  Initial vitals:  T 98.66F, RR 16, SpO2 100%, HR 103, BP 111/77; BG 869, AG 20, +ketonuria, venous pH 7.337; she received 3L NSS, started on insulin drip and transferred to [facility].  Meds: Current Facility-Administered Medications  Medication Dose Route Frequency Provider Last Rate Last Dose  . 0.9 %  sodium chloride infusion   Intravenous Continuous Cresenciano Genre, MD      . 0.9 %  sodium chloride infusion   Intravenous Continuous Cresenciano Genre, MD      . 0.9 % NaCl with KCl 20 mEq/ L  infusion   Intravenous Once Ephraim Hamburger, MD      . dextrose 5 %-0.45 % sodium chloride infusion   Intravenous Continuous Cresenciano Genre, MD      . dextrose 50 % solution 25 mL  25 mL Intravenous PRN Cresenciano Genre, MD      . enoxaparin (LOVENOX)  injection 40 mg  40 mg Subcutaneous Q24H Cresenciano Genre, MD      . insulin regular (NOVOLIN R,HUMULIN R) 1 Units/mL in sodium chloride 0.9 % 100 mL infusion   Intravenous Continuous Cresenciano Genre, MD      . potassium chloride 10 mEq in 100 mL IVPB  10 mEq Intravenous Q1H Cresenciano Genre, MD        Allergies: Allergies as of 05/16/2013 - Review Complete 05/16/2013  Allergen Reaction Noted  . Penicillins Hives 11/11/2010   Past Medical History  Diagnosis Date  . Gonorrhea 08/2011    Treated in 09/2011  . Diabetes mellitus 2001    Diagnosed at age 62 ; Type I   Past Surgical History  Procedure Laterality Date  . No past surgeries     Family History  Problem Relation Age of Onset  . Anesthesia problems Neg Hx   . Asthma Mother   . Gout Father   . Diabetes Paternal Grandmother    History   Social History  . Marital Status: Single    Spouse Name:  N/A    Number of Children: 0  . Years of Education: 11th grade   Occupational History  . unemployed     has never worked   Social History Main Topics  . Smoking status: Never Smoker   . Smokeless tobacco: Never Used  . Alcohol Use: No  . Drug Use: No  . Sexual Activity: Yes    Birth Control/ Protection: None     Comment: As of 11/06/11 1 partner (female) not using protection   Other Topics Concern  . Not on file   Social History Narrative   Patient lives in [place] mother lives in [place].  Unemployed.  Previously worked for a IT consultant.  Completed 11 grade working on [employer]. Patient 3 brothers    [facility] May 09, 2013.  Review of Systems: Per HPI.   Denies recent cold symptoms, chest pain, dyspnea, abdominal pain, change in appetite, diarrhea, dysuria.  Physical Exam: Blood pressure 117/74, pulse 100, temperature 98.3 F (36.8 C), temperature source Oral, resp. rate 18, height 5\' 3"  (1.6 m), weight 52.209 kg (115 lb 1.6 oz), SpO2 100.00%. General: resting in bed in NAD HEENT: PERRL, oropharynx clear and  moist Cardiac: RRR, no rubs, murmurs or gallops Pulm: clear to auscultation bilaterally, moving normal volumes of air Abd: soft, nontender, nondistended, BS present Ext: warm and well perfused, no pedal edema Neuro: alert and oriented X3 Skin:  No wounds or ulcers noted  Lab results: Basic Metabolic Panel:  Recent Labs  05/16/13 1530 05/16/13 2000  NA 120* 130*  K 5.0 3.5*  CL 78* 93*  CO2 22 18*  GLUCOSE 869* 379*  BUN 11 9  CREATININE 0.66 0.52  CALCIUM 9.7 8.9   Liver Function Tests:  Recent Labs  05/16/13 1530  AST 12  ALT 9  ALKPHOS 74  BILITOT 0.7  PROT 7.6  ALBUMIN 4.1   CBC:  Recent Labs  05/16/13 1530  WBC 6.0  HGB 13.9  HCT 38.7  MCV 78.5  PLT 332   CBG:  Recent Labs  05/16/13 1447 05/16/13 1815 05/16/13 1935 05/16/13 2056 05/16/13 2157 05/16/13 2259  GLUCAP >600* 497* 368* 283* 266* 298*   Urine Drug Screen: Drugs of Abuse     Component Value Date/Time   LABOPIA NONE DETECTED 09/27/2012 1228   LABOPIA NEGATIVE 12/18/2009 1453   COCAINSCRNUR NONE DETECTED 09/27/2012 1228   COCAINSCRNUR NEGATIVE 12/18/2009 1453   LABBENZ NONE DETECTED 09/27/2012 1228   LABBENZ NEGATIVE 12/18/2009 1453   AMPHETMU NONE DETECTED 09/27/2012 1228   AMPHETMU NEGATIVE 12/18/2009 1453   THCU NONE DETECTED 09/27/2012 1228   LABBARB NONE DETECTED 09/27/2012 1228    Urinalysis:  Recent Labs  05/16/13 1456  COLORURINE YELLOW  LABSPEC 1.031*  PHURINE 6.0  GLUCOSEU >1000*  HGBUR NEGATIVE  BILIRUBINUR NEGATIVE  KETONESUR 15*  PROTEINUR NEGATIVE  UROBILINOGEN 0.2  NITRITE NEGATIVE  LEUKOCYTESUR NEGATIVE   Assessment & Plan by Problem: 23 year old with DM type 1 (diagnosed at age 77; Hgb A1c > 14.0 July 2014) presenting in DKA.  DKA:  BG 869, + ketonuria and AG 20.  No evidence of infection.  DKA was likely precipitated by lack of basal insulin for the past two days.  Her symptoms have resolved after fluid and insulin treatment.  BG 266 on arrival to [facility] - admit to IMTS on telemetry - continue insulin drip and IVF  - NSS --> D5-0.5NSS once BG < 250 - monitor K and replete as necessary -  BMETs q2h x 4 - start [place] insulin once AG closed x 2 BMETs - continue insulin drip for 2 hours after [place] insulin started - EKG - orthostatics - patient had complaint of dizziness  Hypokalemia:  K was 3.50 and K 61mEq IV x1 already given.  K now 3.2. - K Dur 56mEq q4h x 2 doses - BMETs q2h x 4  Inability to afford medication:  Patient will need assistance paying for medication.   - Consult care management prior to discharge.  VTE ppx:  Lovenox  Dispo: Disposition is deferred at this time, awaiting improvement of current medical problems. Anticipated discharge in approximately 2-3 day(s).   The patient does have a current PCP Cresenciano Genre, MD) and does need an [facility] hospital follow-up appointment after discharge.  The patient does not know have transportation limitations that hinder transportation to clinic appointments.  Signed: Duwaine Maxin, DO 05/16/2013, 11:31 PM

## 2013-05-16 NOTE — ED Provider Notes (Signed)
CSN: [id number]     Arrival date & time 05/16/13  1417 History   First MD Initiated Contact with Patient 05/16/13 1502     Chief Complaint  Patient presents with  . Hyperglycemia  . Dizziness   (Consider location/radiation/quality/duration/timing/severity/associated sxs/prior Treatment) HPI Comments: 23 year old female presents with dizziness and nausea similar to when her glucose is benign. She states she's been out of her Lantus for the past 2 days. She still has NovoLog which is trying to take but has no long-acting insulin. She has type 1 diabetes. She has also learned to this when she had high glucoses in the past. She's tried calling her doctor usually can calm for problems closed. She states that she wanted to come in before she was in DKA. Denies headaches, chest pain, shortness of breath, palpitations, abdominal pain or urinary symptoms. She recently got off her period. States her lightheadedness is worse with standing. She has not checked her glucose in several days to she's not know of his running.   Past Medical History  Diagnosis Date  . Gonorrhea 08/2011    Treated in 09/2011  . Diabetes mellitus 2001    Diagnosed at age 32 ; Type I   Past Surgical History  Procedure Laterality Date  . No past surgeries     Family History  Problem Relation Age of Onset  . Anesthesia problems Neg Hx   . Asthma Mother   . Gout Father   . Diabetes Paternal Grandmother    History  Substance Use Topics  . Smoking status: Never Smoker   . Smokeless tobacco: Never Used  . Alcohol Use: No   OB History   Grav Para Term Preterm Abortions TAB SAB Ect Mult Living   1 0   1  1   0     Review of Systems  Constitutional: Negative for fever and chills.  HENT: Negative for congestion.   Eyes: Negative for visual disturbance.  Respiratory: Negative for cough and shortness of breath.   Cardiovascular: Negative for chest pain.  Gastrointestinal: Positive for nausea. Negative for vomiting,  abdominal pain, diarrhea and constipation.  Genitourinary: Negative for dysuria and vaginal bleeding (Just finished her period).  Neurological: Positive for dizziness and light-headedness. Negative for weakness and headaches.  All other systems reviewed and are negative.    Allergies  Penicillins  Home Medications   Current Outpatient Rx  Name  Route  Sig  Dispense  Refill  . Blood Glucose Monitoring Suppl (ACCU-CHEK NANO SMARTVIEW) W/DEVICE KIT   Does not apply   1 each by Does not apply route 3 (three) times daily. Bring meter to all visits 250.03 insulin requiring         . clindamycin (CLEOCIN) 150 MG capsule   Oral   Take 1 capsule (150 mg total) by mouth every 6 (six) hours.   28 capsule   0   . doxycycline (VIBRAMYCIN) 100 MG capsule   Oral   Take 1 capsule (100 mg total) by mouth 2 (two) times daily.   20 capsule   0   . glucose blood test strip      Use as instructed   100 each   12     250.0 accucheck nano strips   . insulin aspart (NOVOLOG FLEXPEN) 100 UNIT/ML SOPN FlexPen      Check glucose three times a day before meals: 120-160: 2 units, 161-200: 3 units, 201-240: 4 units, 241-280: 5 units, 281- >/= 320: 7 units 321-360:  9 units 361-400: 11 units. If glucose remains > 400 units on 2 times you check it call the office   5 pen   6   . Insulin Glargine (LANTUS SOLOSTAR) 100 UNIT/ML SOPN   Subcutaneous   Inject 18 Units into the skin every 12 (twelve) hours.   5 pen   6   . pantoprazole (PROTONIX) 40 MG tablet   Oral   Take 1 tablet (40 mg total) by mouth daily.   30 tablet   6    BP 111/77  Pulse 103  Temp(Src) 98.1 F (36.7 C) (Oral)  Resp 16  SpO2 100% Physical Exam  Nursing note and vitals reviewed. Constitutional: She is oriented to person, place, and time. She appears well-developed and well-nourished.  HENT:  Head: Normocephalic and atraumatic.  Right Ear: External ear normal.  Left Ear: External ear normal.  Nose: Nose normal.   Eyes: EOM are normal. Pupils are equal, round, and reactive to light. Right eye exhibits no discharge. Left eye exhibits no discharge.  Cardiovascular: Regular rhythm and normal heart sounds.  Tachycardia present.   Mild tachycardia just over 100  Pulmonary/Chest: Effort normal and breath sounds normal.  Abdominal: Soft. She exhibits no distension. There is no tenderness.  Neurological: She is alert and oriented to person, place, and time. She has normal strength. No cranial nerve deficit or sensory deficit. GCS eye subscore is 4. GCS verbal subscore is 5. GCS motor subscore is 6.  Skin: Skin is warm and dry.    ED Course  Procedures (including critical care time) Labs Review Labs Reviewed  GLUCOSE, CAPILLARY - Abnormal; Notable for the following:    Glucose-Capillary >600 (*)    All other components within normal limits  COMPREHENSIVE METABOLIC PANEL - Abnormal; Notable for the following:    Sodium 120 (*)    Chloride 78 (*)    Glucose, Bld 869 (*)    All other components within normal limits  URINALYSIS, ROUTINE W REFLEX MICROSCOPIC - Abnormal; Notable for the following:    APPearance CLOUDY (*)    Specific Gravity, Urine 1.031 (*)    Glucose, UA >1000 (*)    Ketones, ur 15 (*)    All other components within normal limits  BLOOD GAS, VENOUS - Abnormal; Notable for the following:    pH, Ven 7.337 (*)    Bicarbonate 24.1 (*)    All other components within normal limits  URINE MICROSCOPIC-ADD ON - Abnormal; Notable for the following:    Squamous Epithelial / LPF FEW (*)    All other components within normal limits  CBC   Imaging Review No results found.  EKG Interpretation   None       MDM   1. DKA (diabetic ketoacidoses)    Patient has stable vitals. Given fluids in ED. I feel her dizziness is related to dehydration from severe hyperglycemia. Her pH is borderline at 7.33, but with her significant hyperglycemia and AG of 20 with ketones in urine, I feel she meets  criteria for DKA and will benefit from rehydration and insulin drip. [facility] to [facility] as she is a patient of Outpatient Clinics. Dr. Harl Bowie is the accepting physician.    Ephraim Hamburger, MD 05/16/13 [phone]

## 2013-05-17 LAB — GLUCOSE, CAPILLARY
GLUCOSE-CAPILLARY: 100 mg/dL — AB (ref 70–99)
GLUCOSE-CAPILLARY: 111 mg/dL — AB (ref 70–99)
GLUCOSE-CAPILLARY: 117 mg/dL — AB (ref 70–99)
GLUCOSE-CAPILLARY: 148 mg/dL — AB (ref 70–99)
GLUCOSE-CAPILLARY: 173 mg/dL — AB (ref 70–99)
GLUCOSE-CAPILLARY: 248 mg/dL — AB (ref 70–99)
Glucose-Capillary: 134 mg/dL — ABNORMAL HIGH (ref 70–99)
Glucose-Capillary: 181 mg/dL — ABNORMAL HIGH (ref 70–99)
Glucose-Capillary: 198 mg/dL — ABNORMAL HIGH (ref 70–99)
Glucose-Capillary: 214 mg/dL — ABNORMAL HIGH (ref 70–99)
Glucose-Capillary: 160 mg/dL — ABNORMAL HIGH (ref 70–99)
Glucose-Capillary: 117 mg/dL — ABNORMAL HIGH (ref 70–99)
Glucose-Capillary: 293 mg/dL — ABNORMAL HIGH (ref 70–99)
Glucose-Capillary: 325 mg/dL — ABNORMAL HIGH (ref 70–99)

## 2013-05-17 LAB — BASIC METABOLIC PANEL
BUN: 7 mg/dL (ref 6–23)
BUN: 7 mg/dL (ref 6–23)
BUN: 6 mg/dL (ref 6–23)
CALCIUM: 7.9 mg/dL — AB (ref 8.4–10.5)
CHLORIDE: 101 meq/L (ref 96–112)
CHLORIDE: 103 meq/L (ref 96–112)
CO2: 20 meq/L (ref 19–32)
CO2: 20 meq/L (ref 19–32)
CO2: 22 meq/L (ref 19–32)
Calcium: 8.3 mg/dL — ABNORMAL LOW (ref 8.4–10.5)
Calcium: 7.9 mg/dL — ABNORMAL LOW (ref 8.4–10.5)
Chloride: 104 mEq/L (ref 96–112)
Creatinine, Ser: 0.48 mg/dL — ABNORMAL LOW (ref 0.50–1.10)
Creatinine, Ser: 0.5 mg/dL (ref 0.50–1.10)
Creatinine, Ser: 0.51 mg/dL (ref 0.50–1.10)
GFR calc Af Amer: >90 mL/min (ref >90)
GFR calc Af Amer: >90 mL/min (ref >90)
GFR calc Af Amer: >90 mL/min (ref >90)
GFR calc non Af Amer: >90 mL/min (ref >90)
GFR calc non Af Amer: >90 mL/min (ref >90)
GFR calc non Af Amer: >90 mL/min (ref >90)
GLUCOSE: 133 mg/dL — AB (ref 70–99)
GLUCOSE: 193 mg/dL — AB (ref 70–99)
Glucose, Bld: 138 mg/dL — ABNORMAL HIGH (ref 70–99)
POTASSIUM: 4 meq/L (ref 3.7–5.3)
Potassium: 3.3 mEq/L — ABNORMAL LOW (ref 3.7–5.3)
Potassium: 3.8 mEq/L (ref 3.7–5.3)
SODIUM: 136 meq/L — AB (ref 137–147)
SODIUM: 138 meq/L (ref 137–147)
Sodium: 136 mEq/L — ABNORMAL LOW (ref 137–147)

## 2013-05-17 LAB — HEMOGLOBIN A1C
HEMOGLOBIN A1C: 17 % — AB (ref <5.7)
MEAN PLASMA GLUCOSE: 441 mg/dL — AB (ref <117)

## 2013-05-17 MED ORDER — INSULIN GLARGINE 100 UNIT/ML ~~LOC~~ SOLN
10.0000 [IU] | Freq: Once | SUBCUTANEOUS | Status: AC
  Administered 2013-05-17: 10 [IU] via SUBCUTANEOUS
  Filled 2013-05-17: qty 0.1

## 2013-05-17 MED ORDER — INFLUENZA VAC SPLIT QUAD 0.5 ML IM SUSP
0.5000 mL | INTRAMUSCULAR | Status: AC
  Administered 2013-05-18: 0.5 mL via INTRAMUSCULAR
  Filled 2013-05-17: qty 0.5

## 2013-05-17 MED ORDER — ACETAMINOPHEN 325 MG PO TABS
650.0000 mg | ORAL_TABLET | Freq: Four times a day (QID) | ORAL | Status: DC | PRN
  Administered 2013-05-17 – 2013-05-19 (×2): 650 mg via ORAL
  Filled 2013-05-17 (×2): qty 2

## 2013-05-17 MED ORDER — SODIUM CHLORIDE 0.9 % IV SOLN
INTRAVENOUS | Status: DC
  Administered 2013-05-17: 12:00:00 via INTRAVENOUS

## 2013-05-17 MED ORDER — POTASSIUM CHLORIDE CRYS ER 20 MEQ PO TBCR
40.0000 meq | EXTENDED_RELEASE_TABLET | ORAL | Status: AC
  Administered 2013-05-17 (×2): 40 meq via ORAL
  Filled 2013-05-17 (×2): qty 2

## 2013-05-17 MED ORDER — INSULIN ASPART 100 UNIT/ML ~~LOC~~ SOLN
0.0000 [IU] | Freq: Three times a day (TID) | SUBCUTANEOUS | Status: DC
  Administered 2013-05-17: 7 [IU] via SUBCUTANEOUS
  Administered 2013-05-17: 11 [IU] via SUBCUTANEOUS
  Administered 2013-05-18: 20 [IU] via SUBCUTANEOUS

## 2013-05-17 MED ORDER — INSULIN GLARGINE 100 UNIT/ML ~~LOC~~ SOLN
10.0000 [IU] | Freq: Every day | SUBCUTANEOUS | Status: DC
  Administered 2013-05-17: 10 [IU] via SUBCUTANEOUS
  Filled 2013-05-17 (×2): qty 0.1

## 2013-05-17 NOTE — Progress Notes (Signed)
Utilization review completed.  

## 2013-05-17 NOTE — H&P (Signed)
  Date: 05/17/2013  Patient name: Ashley Freeman  Medical record number: [id number]  Date of birth: July 07, 1990   I have seen and evaluated Ashley Freeman and discussed their care with the Residency Team. [name] was admitted for DKA after running out of her Lantus. She was Type I DM poorly controlled.   Assessment and Plan: I have seen and evaluated the patient as outlined above. I agree with the formulated Assessment and Plan as detailed in the residents' admission note, with the following changes:   1. DKA - no infectious or other etiology found except for poor compliance with her insulin regimen. She had reported to be out of Lantus on 12/26 and was rec to come to the ED for eval since [facility] was closed but never came. She stated today, she later found some Lantus in her fridge and only ran out 2 days ago although she was cont to use her Novoolg. She was tx with the standard DKA protocol and her gap has closed, her bicarb is nl. She is currently on D5 1/2 NS an insulin gtt pending a repeat BMP.  2. Uncontrolled Type I DM - Her most recent A1C in July 2014 was > 14. She states that she is too busy with work and sleep to manage her DM. She admits that she has testing supplies but rarely checks her CBG. Dr Ashley Freeman has had extensive discussions with her regarding the implications of her decisions.   Bartholomew Crews, MD 1/18/20159:20 AM

## 2013-05-17 NOTE — Progress Notes (Addendum)
Subjective: [name] is doing well this morning, no complaints other than not being able to sleep due to blood draws.  No longer feeling dizzy or nauseous, hungry now.   Objective: Vital signs in last 24 hours: Filed Vitals:   05/17/13 0106 05/17/13 0108 05/17/13 0110 05/17/13 0500  BP: 106/64 114/77 121/75 99/60  Pulse: 95 94 98 88  Temp: 98.4 F (36.9 C) 98.3 F (36.8 C) 97.9 F (36.6 C) 97.6 F (36.4 C)  TempSrc: Oral Oral Oral Oral  Resp: 18 18 18 18   Height:      Weight:      SpO2: 100% 100% 99% 99%   Weight change:  No intake or output data in the 24 hours ending 05/17/13 0747 PEX General: alert, cooperative, NAD HEENT: NCAT, vision grossly intact, oropharynx clear and non-erythematous  Neck: supple, no lymphadenopathy Lungs: clear to ascultation bilaterally, normal work of respiration, no wheezes, rales, ronchi Heart: regular rate and rhythm, no murmurs, gallops, or rubs Abdomen: soft, non-tender, non-distended, normal bowel sounds Extremities: 2+ DP/PT pulses bilaterally, no cyanosis, clubbing, or edema Neurologic: alert & oriented X3, cranial nerves II-XII intact, strength grossly intact, sensation intact to light touch  Lab Results: Basic Metabolic Panel:  Recent Labs Lab 05/17/13 0025 05/17/13 0400  NA 138 136*  K 3.3* 4.0  CL 101 103  CO2 22 20  GLUCOSE 138* 133*  BUN 7 7  CREATININE 0.48* 0.50  CALCIUM 8.3* 7.9*   Liver Function Tests:  Recent Labs Lab 05/16/13 1530  AST 12  ALT 9  ALKPHOS 74  BILITOT 0.7  PROT 7.6  ALBUMIN 4.1   CBC:  Recent Labs Lab 05/16/13 1530  WBC 6.0  HGB 13.9  HCT 38.7  MCV 78.5  PLT 332   CBG:  Recent Labs Lab 05/17/13 0158 05/17/13 0259 05/17/13 0359 05/17/13 0459 05/17/13 0605 05/17/13 0702  GLUCAP 117* 100* 111* 134* 181* 198*   Urinalysis:  Recent Labs Lab 05/16/13 1456  COLORURINE YELLOW  LABSPEC 1.031*  PHURINE 6.0  GLUCOSEU >1000*  HGBUR NEGATIVE  BILIRUBINUR NEGATIVE    KETONESUR 15*  PROTEINUR NEGATIVE  UROBILINOGEN 0.2  NITRITE NEGATIVE  LEUKOCYTESUR NEGATIVE   Medications: I have reviewed the patient's current medications. Scheduled Meds: . 0.9 % NaCl with KCl 20 mEq / L   Intravenous Once  . enoxaparin (LOVENOX) injection  40 mg Subcutaneous Q24H  . [START ON 05/18/2013] influenza vac split quadrivalent PF  0.5 mL Intramuscular Tomorrow-1000   Continuous Infusions: . sodium chloride 125 mL/hr at 05/16/13 2300  . dextrose 5 % and 0.45% NaCl 125 mL/hr at 05/16/13 2358  . insulin (NOVOLIN-R) infusion 2.8 Units/hr (05/17/13 0706)   PRN Meds:.dextrose Assessment/Plan: #DKA in setting of DM1 (A1C >14% in 10/2012): On admission, BG 869 with AG 20 (HCO3 22), + ketonuria. No evidence of infection. DKA was likely precipitated by lack of medications since at least 04/24/13 per chart. Her symptoms of dizziness and nausea have resolved after insulin gtt and IVFs.  AG 19 --> 19 --> 15 --> 13 overnight.  BGs now high 100s. Orthostatics negative (though after fluid resuscitation).    - continue insulin drip until AG closed x 2 (BMP pending, if normal AG again will convert to Lantus 10u subq and order diet with insulin gtt to run for additional 1-2 hours after) - continue D51/2NS at 125 cc/hr for now - BMPs q2h for now - replete K as necessary  - case management consult for help with  medication needs; call Cassandra at [phone] tomorrow - close outpatient follow-up with retinal scan at next visit  Dispo: Disposition is deferred at this time, awaiting improvement of current medical problems.  Anticipated discharge in approximately 1-2 day(s).   The patient does have a current PCP Cresenciano Genre, MD) and does need an [facility] hospital follow-up appointment after discharge. [name]  .Services Needed at time of discharge: Y = Yes, Blank = No PT:   OT:   RN:   Equipment:   Other:     LOS: 1 day   Ivin Poot, MD 05/17/2013, 7:47 AM

## 2013-05-18 LAB — CBC
HEMATOCRIT: 35.7 % — AB (ref 36.0–46.0)
Hemoglobin: 12.4 g/dL (ref 12.0–15.0)
MCH: 27.8 pg (ref 26.0–34.0)
MCHC: 34.7 g/dL (ref 30.0–36.0)
MCV: 80 fL (ref 78.0–100.0)
Platelets: 286 10*3/uL (ref 150–400)
RBC: 4.46 MIL/uL (ref 3.87–5.11)
RDW: 13.2 % (ref 11.5–15.5)
WBC: 5.1 10*3/uL (ref 4.0–10.5)

## 2013-05-18 LAB — GLUCOSE, CAPILLARY
GLUCOSE-CAPILLARY: 110 mg/dL — AB (ref 70–99)
GLUCOSE-CAPILLARY: 121 mg/dL — AB (ref 70–99)
GLUCOSE-CAPILLARY: 179 mg/dL — AB (ref 70–99)
GLUCOSE-CAPILLARY: 239 mg/dL — AB (ref 70–99)
Glucose-Capillary: 359 mg/dL — ABNORMAL HIGH (ref 70–99)
Glucose-Capillary: 120 mg/dL — ABNORMAL HIGH (ref 70–99)
Glucose-Capillary: 176 mg/dL — ABNORMAL HIGH (ref 70–99)
Glucose-Capillary: 235 mg/dL — ABNORMAL HIGH (ref 70–99)

## 2013-05-18 LAB — BASIC METABOLIC PANEL
BUN: 9 mg/dL (ref 6–23)
BUN: 7 mg/dL (ref 6–23)
BUN: 5 mg/dL — AB (ref 6–23)
BUN: 6 mg/dL (ref 6–23)
BUN: 6 mg/dL (ref 6–23)
CALCIUM: 8.5 mg/dL (ref 8.4–10.5)
CHLORIDE: 106 meq/L (ref 96–112)
CHLORIDE: 107 meq/L (ref 96–112)
CHLORIDE: 109 meq/L (ref 96–112)
CHLORIDE: 103 meq/L (ref 96–112)
CO2: 15 mEq/L — ABNORMAL LOW (ref 19–32)
CO2: 20 meq/L (ref 19–32)
CO2: 22 meq/L (ref 19–32)
CO2: 19 mEq/L (ref 19–32)
CO2: 20 meq/L (ref 19–32)
CREATININE: 0.48 mg/dL — AB (ref 0.50–1.10)
CREATININE: 0.46 mg/dL — AB (ref 0.50–1.10)
CREATININE: 0.42 mg/dL — AB (ref 0.50–1.10)
Calcium: 8.5 mg/dL (ref 8.4–10.5)
Calcium: 8.8 mg/dL (ref 8.4–10.5)
Calcium: 8.2 mg/dL — ABNORMAL LOW (ref 8.4–10.5)
Calcium: 8.3 mg/dL — ABNORMAL LOW (ref 8.4–10.5)
Chloride: 103 mEq/L (ref 96–112)
Creatinine, Ser: 0.52 mg/dL (ref 0.50–1.10)
Creatinine, Ser: 0.49 mg/dL — ABNORMAL LOW (ref 0.50–1.10)
GFR calc Af Amer: >90 mL/min (ref >90)
GFR calc Af Amer: >90 mL/min (ref >90)
GFR calc Af Amer: >90 mL/min (ref >90)
GFR calc Af Amer: >90 mL/min (ref >90)
GFR calc non Af Amer: >90 mL/min (ref >90)
GFR calc non Af Amer: >90 mL/min (ref >90)
GFR calc non Af Amer: >90 mL/min (ref >90)
GFR calc non Af Amer: >90 mL/min (ref >90)
GFR calc non Af Amer: >90 mL/min (ref >90)
GLUCOSE: 105 mg/dL — AB (ref 70–99)
GLUCOSE: 114 mg/dL — AB (ref 70–99)
GLUCOSE: 117 mg/dL — AB (ref 70–99)
Glucose, Bld: 276 mg/dL — ABNORMAL HIGH (ref 70–99)
Glucose, Bld: 359 mg/dL — ABNORMAL HIGH (ref 70–99)
POTASSIUM: 4.5 meq/L (ref 3.7–5.3)
POTASSIUM: 4.1 meq/L (ref 3.7–5.3)
Potassium: 4.2 mEq/L (ref 3.7–5.3)
Potassium: 4.6 mEq/L (ref 3.7–5.3)
Potassium: 4.1 mEq/L (ref 3.7–5.3)
SODIUM: 140 meq/L (ref 137–147)
SODIUM: 136 meq/L — AB (ref 137–147)
Sodium: 135 mEq/L — ABNORMAL LOW (ref 137–147)
Sodium: 140 mEq/L (ref 137–147)
Sodium: 138 mEq/L (ref 137–147)

## 2013-05-18 LAB — URINALYSIS W MICROSCOPIC + REFLEX CULTURE
Bilirubin Urine: NEGATIVE
Glucose, UA: >1000 mg/dL — AB
HGB URINE DIPSTICK: NEGATIVE
KETONES UR: 15 mg/dL — AB
Leukocytes, UA: NEGATIVE
Nitrite: NEGATIVE
Protein, ur: NEGATIVE mg/dL
Specific Gravity, Urine: 1.027 (ref 1.005–1.030)
UROBILINOGEN UA: 0.2 mg/dL (ref 0.0–1.0)
pH: 6 (ref 5.0–8.0)

## 2013-05-18 MED ORDER — SODIUM CHLORIDE 0.9 % IV SOLN: INTRAVENOUS | Status: DC

## 2013-05-18 MED ORDER — SODIUM CHLORIDE 0.9 % IV SOLN
INTRAVENOUS | Status: AC
  Administered 2013-05-18: 13:00:00 via INTRAVENOUS

## 2013-05-18 MED ORDER — DEXTROSE 50 % IV SOLN: 25.0000 mL | INTRAVENOUS | Status: DC | PRN

## 2013-05-18 MED ORDER — DEXTROSE-NACL 5-0.45 % IV SOLN
INTRAVENOUS | Status: DC
  Administered 2013-05-18 (×3): via INTRAVENOUS

## 2013-05-18 MED ORDER — INSULIN ASPART 100 UNIT/ML ~~LOC~~ SOLN: 0.0000 [IU] | Freq: Three times a day (TID) | SUBCUTANEOUS | Status: DC

## 2013-05-18 MED ORDER — SODIUM CHLORIDE 0.9 % IV BOLUS (SEPSIS)
1000.0000 mL | Freq: Once | INTRAVENOUS | Status: AC
  Administered 2013-05-18: 1000 mL via INTRAVENOUS

## 2013-05-18 MED ORDER — INSULIN ASPART 100 UNIT/ML ~~LOC~~ SOLN
0.0000 [IU] | Freq: Three times a day (TID) | SUBCUTANEOUS | Status: DC
  Administered 2013-05-19: 2 [IU] via SUBCUTANEOUS
  Administered 2013-05-19: 5 [IU] via SUBCUTANEOUS

## 2013-05-18 MED ORDER — INSULIN GLARGINE 100 UNIT/ML ~~LOC~~ SOLN
10.0000 [IU] | Freq: Once | SUBCUTANEOUS | Status: AC
Start: 2013-05-18 — End: 2013-05-18
  Administered 2013-05-18: 10 [IU] via SUBCUTANEOUS
  Filled 2013-05-18: qty 0.1

## 2013-05-18 MED ORDER — INSULIN GLARGINE 100 UNIT/ML ~~LOC~~ SOLN
10.0000 [IU] | Freq: Every day | SUBCUTANEOUS | Status: DC
  Administered 2013-05-18: 10 [IU] via SUBCUTANEOUS
  Filled 2013-05-18 (×2): qty 0.1

## 2013-05-18 MED ORDER — SODIUM CHLORIDE 0.9 % IV SOLN
INTRAVENOUS | Status: DC
  Administered 2013-05-18: 0.6 [IU]/h via INTRAVENOUS
  Filled 2013-05-18: qty 1

## 2013-05-18 MED ORDER — POTASSIUM CHLORIDE 10 MEQ/100ML IV SOLN
10.0000 meq | INTRAVENOUS | Status: AC
  Administered 2013-05-18 (×2): 10 meq via INTRAVENOUS
  Filled 2013-05-18: qty 100

## 2013-05-18 MED ORDER — SODIUM CHLORIDE 0.9 % IV SOLN
INTRAVENOUS | Status: DC
  Administered 2013-05-18: 16:00:00 via INTRAVENOUS

## 2013-05-18 MED ORDER — INSULIN GLARGINE 100 UNIT/ML ~~LOC~~ SOLN: 12.0000 [IU] | Freq: Once | SUBCUTANEOUS | Status: DC

## 2013-05-18 MED ORDER — INSULIN ASPART 100 UNIT/ML ~~LOC~~ SOLN
0.0000 [IU] | Freq: Every day | SUBCUTANEOUS | Status: DC
  Administered 2013-05-18: 2 [IU] via SUBCUTANEOUS

## 2013-05-18 MED ORDER — ENOXAPARIN SODIUM 40 MG/0.4ML ~~LOC~~ SOLN
40.0000 mg | SUBCUTANEOUS | Status: DC
  Administered 2013-05-18: 40 mg via SUBCUTANEOUS
  Filled 2013-05-18: qty 0.4

## 2013-05-18 NOTE — Care Management Note (Signed)
    Page 1 of 1   05/18/2013     11:00:50 AM   CARE MANAGEMENT NOTE 05/18/2013  Patient:  Ashley Freeman, Ashley Freeman   Account Number:  [id number]  Date Initiated:  05/18/2013  Documentation initiated by:  Magdalen Spatz  Subjective/Objective Assessment:     Action/Plan:   Anticipated DC Date:  05/19/2013   Anticipated DC Plan:  HOME/SELF CARE  In-house referral  [facility]  [facility] Program      Choice offered to / List presented to:             Status of service:   Medicare Important Message given?   (If response is "NO", the following Medicare IM given date fields will be blank) Date Medicare IM given:   Date Additional Medicare IM given:    Discharge Disposition:    Per UR Regulation:    If discussed at Long Length of Stay Meetings, dates discussed:    Comments:  05-18-13 Explained [place] letter to patient voiced understanding and has $3 co pay per prescription. Magdalen Spatz RN BSN

## 2013-05-18 NOTE — Progress Notes (Addendum)
Subjective: [name] is doing well this morning.  Negative ROS.   Patient states when since she started working at [employer] in December, she hasn't had time to renew her Medicaid and thus has not been able to afford her medications.  She will go to renew on day of discharge.   Objective: Vital signs in last 24 hours: Filed Vitals:   05/17/13 1825 05/17/13 2135 05/18/13 0126 05/18/13 0640  BP: 103/65 108/71 104/47 102/64  Pulse: 97 98 93 96  Temp: 98.4 F (36.9 C) 98.4 F (36.9 C) 98.7 F (37.1 C) 97.5 F (36.4 C)  TempSrc: Oral Oral Oral Oral  Resp: 18 18 18 18   Height:      Weight:      SpO2: 100% 100% 100% 100%   Weight change:   Intake/Output Summary (Last 24 hours) at 05/18/13 [id number] Last data filed at 05/17/13 1826  Gross per 24 hour  Intake 2189.28 ml  Output      0 ml  Net 2189.28 ml   PEX General: alert, cooperative, NAD HEENT: NCAT, vision grossly intact, oropharynx clear and non-erythematous  Neck: supple, no lymphadenopathy Lungs: clear to ascultation bilaterally, normal work of respiration, no wheezes, rales, ronchi Heart: regular rate and rhythm, no murmurs, gallops, or rubs Abdomen: soft, non-tender, non-distended, normal bowel sounds Extremities: 2+ DP/PT pulses bilaterally, no cyanosis, clubbing, or edema Neurologic: alert & oriented X3, cranial nerves II-XII intact, strength grossly intact, sensation intact to light touch  Lab Results: Basic Metabolic Panel:  Recent Labs Lab 05/18/13 0015 05/18/13 0550  NA 136* 135*  K 4.2 4.6  CL 103 103  CO2 20 15*  GLUCOSE 276* 359*  BUN 9 7  CREATININE 0.42* 0.46*  CALCIUM 8.5 8.5   Liver Function Tests:  Recent Labs Lab 05/16/13 1530  AST 12  ALT 9  ALKPHOS 74  BILITOT 0.7  PROT 7.6  ALBUMIN 4.1   CBC:  Recent Labs Lab 05/16/13 1530  WBC 6.0  HGB 13.9  HCT 38.7  MCV 78.5  PLT 332   CBG:  Recent Labs Lab 05/17/13 0902 05/17/13 1005 05/17/13 1104 05/17/13 1203  05/17/13 1713 05/17/13 2134  GLUCAP 160* 117* 148* 248* 293* 325*   Urinalysis:  Recent Labs Lab 05/16/13 1456  COLORURINE YELLOW  LABSPEC 1.031*  PHURINE 6.0  GLUCOSEU >1000*  HGBUR NEGATIVE  BILIRUBINUR NEGATIVE  KETONESUR 15*  PROTEINUR NEGATIVE  UROBILINOGEN 0.2  NITRITE NEGATIVE  LEUKOCYTESUR NEGATIVE   Medications: I have reviewed the patient's current medications. Scheduled Meds: . enoxaparin (LOVENOX) injection  40 mg Subcutaneous Q24H  . influenza vac split quadrivalent PF  0.5 mL Intramuscular Tomorrow-1000  . insulin aspart  0-20 Units Subcutaneous TID WC  . insulin glargine  10 Units Subcutaneous QHS   Continuous Infusions: . sodium chloride 75 mL/hr at 05/17/13 1213  . insulin (NOVOLIN-R) infusion Stopped (05/17/13 1222)   PRN Meds:.acetaminophen, dextrose Assessment/Plan: #DKA in setting of DM1 (A1C 17%): On admission, BG 869 with AG 20 (HCO3 22), + ketonuria. No evidence of infection. DKA was likely precipitated by lack of medications since at least 04/24/13 per chart.  Her symptoms of dizziness and nausea on presentation resolved after insulin gtt and IVFs.  AG 19 --> 19 --> 15 --> 13 --> 12 yesterday thus insulin gtt was discontinued, and patient was converted to subq Lantus.  However, AG 17 this morning with bicarb 15, glucose 359.  Repeat UA negative for infection, but glucose >1000 and ketones 15.  Will reinitiate DKA protocol at this time.  Total fluids since admission = 6.1 L (NS 1L bolus x3 = 3L, D5-1/2NS at 125 cc/hr x 12 hr = 1.5L, NS at 75 cc/hr x 22 hr = 1.6L) - DKA protocol with insulin gtt, BMPs q2h  - NS 1L bolus now followed by NS at 250 cc/hr x 4 hours; at that time, will reassess: if CBG still >250, will continue NS at 125 cc/hr or if CBG < 250, will start D5-1/2NS at 100 cc/hr per DKA protocol - monitor K and replete as necessary  - start subq insulin (Lantus 10-12 units) once AG closed x 2; continue insulin gtt x 2 hours after subq insulin  given  - continue telemetry until stabilized - case management consult for help with medication needs; spoke to [place] at [phone] who will deliver Match letter today  - patient will go to [facility] at discharge for retinal scan at next visit - work note at discharge  Dispo: Disposition is deferred at this time, awaiting improvement of current medical problems.  Anticipated discharge tomorrow.   The patient does have a current PCP Cresenciano Genre, MD) and does need an [facility] hospital follow-up appointment after discharge.  Will schedule follow-up appt tomorrow.  [name]  .Services Needed at time of discharge: Y = Yes, Blank = No PT:   OT:   RN:   Equipment:   Other:     LOS: 2 days   Ivin Poot, MD 05/18/2013, 8:21 AM

## 2013-05-18 NOTE — Progress Notes (Signed)
Inpatient Diabetes Program Recommendations  AACE/ADA: New Consensus Statement on Inpatient Glycemic Control (2013)  Target Ranges:  Prepandial:   less than 140 mg/dL      Peak postprandial:   less than 180 mg/dL (1-2 hours)      Critically ill patients:  140 - 180 mg/dL     Results for Ashley Freeman, Ashley Freeman (MRN [id number]) as of 05/18/2013 12:05  Ref. Range 05/18/2013 05:50  Sodium Latest Range: 137-147 mEq/L 135 (L)  Potassium Latest Range: 3.7-5.3 mEq/L 4.6  Chloride Latest Range: 96-112 mEq/L 103  CO2 Latest Range: 19-32 mEq/L 15 (L)  BUN Latest Range: 6-23 mg/dL 7  Creatinine Latest Range: 0.50-1.10 mg/dL 0.46 (L)  Calcium Latest Range: 8.4-10.5 mg/dL 8.5  GFR calc non Af Amer Latest Range: >90 mL/min >90  GFR calc Af Amer Latest Range: >90 mL/min >90  Glucose Latest Range: 70-99 mg/dL 359 (H)    **Noted CO2 only 15 this morning on BMET.  Glucose 359 mg/dl.    **12pm, patient being restarted on IV insulin drip and also being given IVF per orders from IMTS team.  **Attempted to speak with this patient about her home DM regimen.  Patient visibly upset and telling me that the doctors "screwed her up" by not giving her Lantus and Novolog when "they should have".  Per patient, she takes her Lantus and Novolog together at the same time in the morning and together at the same time in the evening.  Patient told me we have one hour and then she is leaving.  Attempted to convince patient to stay to get appropriate medical treatment.  Explained to patient that if she leaves the hospital AMA and does not get her CBGs under control that she will wind up back in the ED with the same problems.  Patient began crying.  I attempted to ascertain why she was so upset, however, patient would not tell me anything further.  **Dr. Stann Mainland in to see patient.  Dr. Stann Mainland convinced patient to stay.  RNs to begin IV insulin drip again at 12 noon.  Per Dr. Stann Mainland, plan is to d/c patient tomorrow and send her  immediately to the IMTS clinic for an eye exam and for an appointment at the [facility] renewal office.  Patient will be given 1 month supply of insulins at d/c through the [facility] program to help her get through until her Medicaid is reinstated.   Will follow. Wyn Quaker RN, MSN, CDE Diabetes Coordinator Inpatient Diabetes Program Team Pager: [phone] (8a-10p)

## 2013-05-19 ENCOUNTER — Other Ambulatory Visit (HOSPITAL_COMMUNITY): Payer: Self-pay | Admitting: Internal Medicine

## 2013-05-19 DIAGNOSIS — H60392 Other infective otitis externa, left ear: Secondary | ICD-10-CM | POA: Diagnosis present

## 2013-05-19 DIAGNOSIS — L089 Local infection of the skin and subcutaneous tissue, unspecified: Secondary | ICD-10-CM

## 2013-05-19 LAB — GLUCOSE, CAPILLARY
GLUCOSE-CAPILLARY: 173 mg/dL — AB (ref 70–99)
Glucose-Capillary: 208 mg/dL — ABNORMAL HIGH (ref 70–99)
Glucose-Capillary: 296 mg/dL — ABNORMAL HIGH (ref 70–99)

## 2013-05-19 LAB — BASIC METABOLIC PANEL
BUN: 5 mg/dL — ABNORMAL LOW (ref 6–23)
BUN: 5 mg/dL — AB (ref 6–23)
CALCIUM: 8.7 mg/dL (ref 8.4–10.5)
CALCIUM: 8.9 mg/dL (ref 8.4–10.5)
CO2: 21 mEq/L (ref 19–32)
CO2: 21 mEq/L (ref 19–32)
CREATININE: 0.37 mg/dL — AB (ref 0.50–1.10)
CREATININE: 0.42 mg/dL — AB (ref 0.50–1.10)
Chloride: 105 mEq/L (ref 96–112)
Chloride: 105 mEq/L (ref 96–112)
GFR calc Af Amer: >90 mL/min (ref >90)
GFR calc non Af Amer: >90 mL/min (ref >90)
Glucose, Bld: 269 mg/dL — ABNORMAL HIGH (ref 70–99)
Glucose, Bld: 255 mg/dL — ABNORMAL HIGH (ref 70–99)
Potassium: 3.8 mEq/L (ref 3.7–5.3)
Potassium: 4.1 mEq/L (ref 3.7–5.3)
SODIUM: 137 meq/L (ref 137–147)
Sodium: 138 mEq/L (ref 137–147)

## 2013-05-19 MED ORDER — INSULIN GLARGINE 100 UNIT/ML ~~LOC~~ SOLN
10.0000 [IU] | Freq: Once | SUBCUTANEOUS | Status: AC
  Administered 2013-05-19: 10 [IU] via SUBCUTANEOUS
  Filled 2013-05-19: qty 0.1

## 2013-05-19 MED ORDER — INSULIN GLARGINE 100 UNIT/ML ~~LOC~~ SOLN: 20.0000 [IU] | Freq: Two times a day (BID) | SUBCUTANEOUS | Status: DC

## 2013-05-19 MED ORDER — CIPROFLOXACIN HCL 500 MG PO TABS: 500.0000 mg | ORAL_TABLET | Freq: Two times a day (BID) | ORAL | Status: DC

## 2013-05-19 NOTE — Discharge Summary (Signed)
Name: Ashley Freeman MRN: [id number] DOB: 02-Apr-1991 23 y.o. PCP: Cresenciano Genre, MD  Date of Admission: 05/16/2013  2:20 PM Date of Discharge: 05/19/2013 Attending Physician: Bartholomew Crews, MD  Discharge Diagnosis: Principal Problem:   DKA (diabetic ketoacidoses) Active Problems:   Diabetes mellitus type 1 (uncontrolled)   Skin of left earlobe with infection  Discharge Medications:   Medication List         ciprofloxacin 500 MG tablet  Commonly known as:  CIPRO  Take 1 tablet (500 mg total) by mouth 2 (two) times daily.     insulin aspart 100 UNIT/ML FlexPen  Commonly known as:  NOVOLOG FLEXPEN  Check glucose three times a day before meals: 120-160: 2 units, 161-200: 3 units, 201-240: 4 units, 241-280: 5 units, 281- >/= 320: 7 units 321-360: 9 units 361-400: 11 units. If glucose remains > 400 units on 2 times you check it call the office     insulin glargine 100 UNIT/ML injection  Commonly known as:  LANTUS  Inject 0.2 mLs (20 Units total) into the skin every 12 (twelve) hours.        Disposition and follow-up:   Ms.Ashley Freeman was discharged from [facility] in Stable condition.  At the hospital follow up visit please address:  1.  Medication compliance with both insulin and antibiotics, diabetes control  2.  Resolution of left outer ear infection   3.  Medicaid renewal status   4.  Retinal scan   5.  Labs / imaging needed at time of follow-up: none  6.  Pending labs/ test needing follow-up: none  Follow-up Appointments: Follow-up Information   Follow up with Clinton Gallant, MD On 05/26/2013. (at 8:45am)    Specialty:  Internal Medicine   Contact information:   [address] [place] [address] [phone]       Discharge Instructions: Discharge Orders   Future Appointments Provider Department Dept Phone   05/26/2013 8:45 AM Clinton Gallant, MD [place] [phone]   Future Orders  Complete By Expires   Call MD for:  persistant nausea and vomiting  As directed    Call MD for:  temperature >100.4  As directed    Diet - low sodium heart healthy  As directed    Increase activity slowly  As directed      Consultations:  diabetes educator  Admission HPI:  [name] is a 23 year old woman with DM type 1 (diagnosed at age 84; HgbA1c > 14.0 July 2014) who presented to the [facility] ED with 1 day complaint of dizziness, increased thirst and dry mouth. These symptoms began last night. She also has nausea but has not vomited. She denies abdominal pain, recent URI, chest pain, dyspnea or dysuria. She had a cold a few weeks ago. Her insulin regimen is 20 units of Lantus q12h and Novolog SSI TID. She ran out of Lantus two days ago. Her last insulin dose was 10 units of Novolog last night. She has not been checking her BG at home.  In the [facility] ED: Initial vitals: T 98.78F, RR 16, SpO2 100%, HR 103, BP 111/77; BG 869, AG 20, +ketonuria, venous pH 7.337; she received 3L NSS, started on insulin drip and transferred to [facility].   Hospital Course by problem list: 1. DKA, now resolved, in setting of uncontrolled DM1 (A1C 17%): On admission, BG 869 with AG 20 (HCO3 22), + ketonuria. No evidence of infection.  DKA was likely precipitated by lack of medications since at least 04/24/13 per chart.  Her symptoms of dizziness and nausea on presentation resolved after insulin gtt and IVFs. AG 19 --> 19 --> 15 --> 13 --> 12 on 1/18 thus insulin gtt was discontinued, and patient was converted to subq Lantus 10 units.  However, AG reopened to 17 with bicarb 15, glucose 359 morning of 1/19.  Repeat UA negative for infection, but + ketonuria. Thus DKA protocol was reinitiated, AG closed x 2 shortly thereafter, patient again converted to subq Lantus 10 units.  AG 12 overnight, 11 morning of discharge with bicarb 21, glucose 255.  Gave additional Lantus 10 units on morning of discharge and instructed patient to  resume home insulin regimen after discharge.  Patient was enrolled in Match program and given prescription for Lantus.  She plans to go directly from the hospital to [facility] office to renew her insurance.  She has a follow-up appointment [facility] next week and will have a retinal scan at that time.   2. Skin infection of left ear crura- On day of discharge, patient mentioned that skin on her upper left ear had been draining yellow discharge for past several days.  On exam, small area on crura of left ear with purulent drainage but no surrounding erythema; no tenderness of ear or posterior to ear.  Patient has been afebrile, no leucocytosis throughout admission.  Ordered wound culture and started ciprofloxacin 500 mg BID x 7 days at discharge (pseudomonal coverage).  Follow-up outpatient next week.        Discharge Vitals:   BP 112/61  Pulse 92  Temp(Src) 97.5 F (36.4 C) (Oral)  Resp 16  Ht 5\' 3"  (1.6 m)  Wt 115 lb 1.6 oz (52.209 kg)  BMI 20.39 kg/m2  SpO2 100%  Discharge Labs:  Results for orders placed during the hospital encounter of 05/16/13 (from the past 24 hour(s))  BASIC METABOLIC PANEL     Status: Abnormal   Collection Time    05/18/13  1:20 PM      Result Value Range   Sodium 140  137 - 147 mEq/L   Potassium 4.1  3.7 - 5.3 mEq/L   Chloride 109  96 - 112 mEq/L   CO2 20  19 - 32 mEq/L   Glucose, Bld 114 (*) 70 - 99 mg/dL   BUN 6  6 - 23 mg/dL   Creatinine, Ser 0.49 (*) 0.50 - 1.10 mg/dL   Calcium 8.2 (*) 8.4 - 10.5 mg/dL   GFR calc non Af Amer >90  >90 mL/min   GFR calc Af Amer >90  >90 mL/min  GLUCOSE, CAPILLARY     Status: Abnormal   Collection Time    05/18/13  1:30 PM      Result Value Range   Glucose-Capillary 110 (*) 70 - 99 mg/dL  BASIC METABOLIC PANEL     Status: Abnormal   Collection Time    05/18/13  2:15 PM      Result Value Range   Sodium 138  137 - 147 mEq/L   Potassium 4.1  3.7 - 5.3 mEq/L   Chloride 107  96 - 112 mEq/L   CO2 19  19 - 32 mEq/L   Glucose,  Bld 117 (*) 70 - 99 mg/dL   BUN 5 (*) 6 - 23 mg/dL   Creatinine, Ser 0.48 (*) 0.50 - 1.10 mg/dL   Calcium 8.3 (*) 8.4 - 10.5 mg/dL   GFR calc non  Af Amer >90  >90 mL/min   GFR calc Af Amer >90  >90 mL/min  GLUCOSE, CAPILLARY     Status: Abnormal   Collection Time    05/18/13  2:36 PM      Result Value Range   Glucose-Capillary 121 (*) 70 - 99 mg/dL  GLUCOSE, CAPILLARY     Status: Abnormal   Collection Time    05/18/13  3:41 PM      Result Value Range   Glucose-Capillary 176 (*) 70 - 99 mg/dL  GLUCOSE, CAPILLARY     Status: Abnormal   Collection Time    05/18/13  4:53 PM      Result Value Range   Glucose-Capillary 179 (*) 70 - 99 mg/dL  GLUCOSE, CAPILLARY     Status: Abnormal   Collection Time    05/18/13  6:31 PM      Result Value Range   Glucose-Capillary 235 (*) 70 - 99 mg/dL  GLUCOSE, CAPILLARY     Status: Abnormal   Collection Time    05/18/13 10:35 PM      Result Value Range   Glucose-Capillary 239 (*) 70 - 99 mg/dL   Comment 1 Notify RN    BASIC METABOLIC PANEL     Status: Abnormal   Collection Time    05/19/13 12:55 AM      Result Value Range   Sodium 138  137 - 147 mEq/L   Potassium 3.8  3.7 - 5.3 mEq/L   Chloride 105  96 - 112 mEq/L   CO2 21  19 - 32 mEq/L   Glucose, Bld 269 (*) 70 - 99 mg/dL   BUN 5 (*) 6 - 23 mg/dL   Creatinine, Ser 0.37 (*) 0.50 - 1.10 mg/dL   Calcium 8.9  8.4 - 10.5 mg/dL   GFR calc non Af Amer >90  >90 mL/min   GFR calc Af Amer >90  >90 mL/min  BASIC METABOLIC PANEL     Status: Abnormal   Collection Time    05/19/13  3:00 AM      Result Value Range   Sodium 137  137 - 147 mEq/L   Potassium 4.1  3.7 - 5.3 mEq/L   Chloride 105  96 - 112 mEq/L   CO2 21  19 - 32 mEq/L   Glucose, Bld 255 (*) 70 - 99 mg/dL   BUN 5 (*) 6 - 23 mg/dL   Creatinine, Ser 0.42 (*) 0.50 - 1.10 mg/dL   Calcium 8.7  8.4 - 10.5 mg/dL   GFR calc non Af Amer >90  >90 mL/min   GFR calc Af Amer >90  >90 mL/min  GLUCOSE, CAPILLARY     Status: Abnormal   Collection  Time    05/19/13  3:13 AM      Result Value Range   Glucose-Capillary 208 (*) 70 - 99 mg/dL   Comment 1 Notify RN    GLUCOSE, CAPILLARY     Status: Abnormal   Collection Time    05/19/13  7:37 AM      Result Value Range   Glucose-Capillary 296 (*) 70 - 99 mg/dL  GLUCOSE, CAPILLARY     Status: Abnormal   Collection Time    05/19/13 11:58 AM      Result Value Range   Glucose-Capillary 173 (*) 70 - 99 mg/dL    Signed: Ivin Poot, MD 05/19/2013, 1:03 PM   Time Spent on Discharge: 40 minutes Services Ordered on Discharge: none Equipment Ordered on  Discharge: none

## 2013-05-19 NOTE — Progress Notes (Addendum)
Subjective: [name] is doing well this morning, ate her whole breakfast.  Now complaining of some yellow discharge from skin on left ear.  Ready to go home.    Objective: Vital signs in last 24 hours: Filed Vitals:   05/18/13 1822 05/18/13 2103 05/19/13 0131 05/19/13 0604  BP: 123/84 109/73 106/56 100/64  Pulse: 88 89 78 97  Temp: 98.3 F (36.8 C) 98.3 F (36.8 C) 98.1 F (36.7 C) 97.7 F (36.5 C)  TempSrc: Oral Oral Oral Oral  Resp: 18 18 18 18   Height:      Weight:      SpO2: 100% 100% 100% 100%   Weight change:   Intake/Output Summary (Last 24 hours) at 05/19/13 [id number] Last data filed at 05/18/13 1739  Gross per 24 hour  Intake 1012.92 ml  Output    400 ml  Net 612.92 ml   PEX General: alert, cooperative, NAD HEENT: small area on crura of left ear with purulent drainage but no surrounding erythema; NCAT, vision grossly intact, oropharynx clear and non-erythematous  Neck: supple, no lymphadenopathy Lungs: clear to ascultation bilaterally, normal work of respiration, no wheezes, rales, ronchi Heart: regular rate and rhythm, no murmurs, gallops, or rubs Abdomen: soft, non-tender, non-distended, normal bowel sounds Extremities: 2+ DP/PT pulses bilaterally, no cyanosis, clubbing, or edema Neurologic: alert & oriented X3, cranial nerves II-XII intact, strength grossly intact, sensation intact to light touch  Lab Results: Basic Metabolic Panel:  Recent Labs Lab 05/19/13 0055 05/19/13 0300  NA 138 137  K 3.8 4.1  CL 105 105  CO2 21 21  GLUCOSE 269* 255*  BUN 5* 5*  CREATININE 0.37* 0.42*  CALCIUM 8.9 8.7   Liver Function Tests:  Recent Labs Lab 05/16/13 1530  AST 12  ALT 9  ALKPHOS 74  BILITOT 0.7  PROT 7.6  ALBUMIN 4.1   CBC:  Recent Labs Lab 05/16/13 1530 05/18/13 1150  WBC 6.0 5.1  HGB 13.9 12.4  HCT 38.7 35.7*  MCV 78.5 80.0  PLT 332 286   CBG:  Recent Labs Lab 05/18/13 1436 05/18/13 1541 05/18/13 1653 05/18/13 1831  05/18/13 2235 05/19/13 0313  GLUCAP 121* 176* 179* 235* 239* 208*   Urinalysis:  Recent Labs Lab 05/16/13 1456 05/18/13 0939  COLORURINE YELLOW YELLOW  LABSPEC 1.031* 1.027  PHURINE 6.0 6.0  GLUCOSEU >1000* >1000*  HGBUR NEGATIVE NEGATIVE  BILIRUBINUR NEGATIVE NEGATIVE  KETONESUR 15* 15*  PROTEINUR NEGATIVE NEGATIVE  UROBILINOGEN 0.2 0.2  NITRITE NEGATIVE NEGATIVE  LEUKOCYTESUR NEGATIVE NEGATIVE   Medications: I have reviewed the patient's current medications. Scheduled Meds: . enoxaparin (LOVENOX) injection  40 mg Subcutaneous Q24H  . insulin aspart  0-5 Units Subcutaneous QHS  . insulin aspart  0-9 Units Subcutaneous TID WC  . insulin glargine  10 Units Subcutaneous QHS   Continuous Infusions: . sodium chloride 125 mL/hr at 05/18/13 1542  . dextrose 5 % and 0.45% NaCl 125 mL/hr at 05/18/13 2305   PRN Meds:.acetaminophen, dextrose Assessment/Plan: #DKA, now resolved, in setting of uncontrolled DM1 (A1C 17%): On admission, BG 869 with AG 20 (HCO3 22), + ketonuria. No evidence of infection. DKA was likely precipitated by lack of medications since at least 04/24/13 per chart.  Her symptoms of dizziness and nausea on presentation resolved after insulin gtt and IVFs.  AG 19 --> 19 --> 15 --> 13 --> 12 on 1/18 thus insulin gtt was discontinued, and patient was converted to subq Lantus.  However, AG reopened to 17 with bicarb  15, glucose 359 yesterday morning.  Repeat UA negative for infection, but glucose >1000 and ketones 15.  Thus DKA protocol was reinitiated, AG closed x 2 by 3pm, again converted to subq Lantus.  Patient had received 6.1L prior to AG reopening, gave additional 2L then IVFs per DKA protocol, NS 125 cc/hr since.  AG 12 overnight, 11 this morning with bicarb 21, glucose 255; CBG 208 this morning.  - Lantus 10 units this AM, instructed patient to resume home regimen at discharge - continue NS at 125 cc/hr through discharge, eating well - wound culture left ear  (crura), start cipro 500 mg BID x 7 days at discharge - work note at discharge, patient to go to [facility] office for renewal upon leaving hospital  - patient to follow-up in [facility] next week and have retinal scan at that time  Dispo: Disposition is deferred at this time, awaiting improvement of current medical problems.  Anticipated discharge today.   The patient does have a current PCP Cresenciano Genre, MD) and does need an [facility] hospital follow-up appointment after discharge.    [name]  .Services Needed at time of discharge: Y = Yes, Blank = No PT:   OT:   RN:   Equipment:   Other:     LOS: 3 days   Ivin Poot, MD 05/19/2013, 7:12 AM

## 2013-05-19 NOTE — Discharge Summary (Signed)
  Date: 05/19/2013  Patient name: Ashley Freeman  Medical record number: [id number]  Date of birth: 1991-03-31   This patient has been seen and the plan of care was discussed with the house staff. Please see their note for complete details. I concur with their findings and plan.  Dominic Pea, DO, [facility] 05/19/2013, 4:06 PM

## 2013-05-19 NOTE — Progress Notes (Signed)
Patient discharged to home with instructions, verbalized understanding. 

## 2013-05-19 NOTE — Discharge Instructions (Signed)
Please take your medications as prescribed.  We are giving you prescriptions for both Lantus and antibiotics (called CIPROFLOXACIN) that you will need to take for one week to treat the infection on your ear.   You should go straight to the Medicaid office when you leave the hospital.  We are giving you a work excuse through the end of the day today so you have time to do this.   Don't forget your follow-up appointment in clinic next week.  You will have your eye scans done at that time after you have renewed your Medicaid.    Diabetic Ketoacidosis Diabetic ketoacidosis (DKA) is a life-threatening complication of type 1 diabetes. It must be quickly recognized and treated. Treatment requires hospitalization. CAUSES  When there is no insulin in the body, glucose (sugar) cannot be used and the body breaks down fat for energy. When fat breaks down, acids (ketones) build up in the blood. Very high levels of glucose and high levels of acids lead to severe loss of body fluids (dehydration) and other dangerous chemical changes. This stresses your vital organs and can cause coma or death. SYMPTOMS   Tiredness (fatigue).  Weight loss.  Excessive thirst.  Ketones in the urine.  Lightheadedness.  Fruity or sweet smell on your breath.  Excessive urination.  Visual changes.  Confusion or irritability.  Feeling sick to your stomach (nauseous) or vomiting.  Rapid breathing.  Stomachache or belly (abdominal) pain. DIAGNOSIS  Your caregiver will diagnose DKA based on your history, physical exam, and blood tests. Your caregiver will check if there is another illness present which caused you to go into DKA. Most of this will be done quickly in an emergency room. TREATMENT   Fluid replacement to correct dehydration.  Insulin.  Correction of electrolytes, such as potassium and sodium.  Medicines (antibiotics) that kill germs for infections. PREVENTION  Always take your insulin. Do not skip  your insulin injections.  If you are ill, treat yourself quickly. Your body often needs more insulin to fight the illness.  Check your blood glucose regularly.  Check urine ketones if your blood glucose is greater than 240 milligrams per deciliter (mg/dl).  Do not used expired or outdated insulin.  If your blood glucose is high, drink plenty of fluids. This helps flush out ketones. HOME CARE INSTRUCTIONS   If you are ill, follow the advice of your caregiver.  To prevent loss of body fluids (dehydration), drink enough water and fluids to keep your urine clear or pale yellow.  If you cannot eat, alternate between drinking fluids with sugar (soda, juices, flavored gelatin) and salty fluids (broth, bouillon).  If you can eat, follow your usual diet and drink sugar-free liquids (water, diet drinks).  Always take your usual dose of insulin. If you cannot eat, or your glucose is getting too low, call your caregiver for further instructions.  Continue to monitor your blood or urine ketones every 3 to 4 hours around the clock. Set your alarm clock or have someone wake you up. If you are too sick, have someone test it for you.  Rest and avoid exercise. SEEK MEDICAL CARE IF:   You have ketones in your urine or your blood glucose is higher than a level your caregiver suggests. You may need extra insulin. Call your caregiver if you need advice on adjusting your insulin.  You cannot drink at least a tablespoon of fluid every 15 to 20 minutes.  You have been throwing up for more than  2 hours.  You have symptoms of DKA:  Fruity smelling breath.  Breathing faster or slower.  Becoming very sleepy. SEEK IMMEDIATE MEDICAL CARE IF:   You have signs of dehydration:  Decreased urination.  Increased thirst.  Dry skin and mouth.  Lightheadedness.  Your blood glucose is very high (as advised by your caregiver) twice in a row.  You or your child has an oral temperature above 102 F (38.9  C), not controlled by medicine.  You pass out.  You have chest pain and/or trouble breathing.  You have a sudden, severe headache.  You have sudden weakness in one arm and/or one leg.  You have sudden difficulty speaking and/or swallowing.  You develop vomiting and/or diarrhea that is getting worse after 3 to 4 hours.  You have abdominal pain. MAKE SURE YOU:   Understand these instructions.  Will watch your condition.  Will get help right away if you are not doing well or get worse. Document Released: 04/13/2000 Document Revised: 07/09/2011 Document Reviewed: 10/20/2008 [facility] Patient Information 2014 [place], [place].

## 2013-05-20 ENCOUNTER — Other Ambulatory Visit: Payer: Self-pay | Admitting: Internal Medicine

## 2013-05-20 DIAGNOSIS — E108 Type 1 diabetes mellitus with unspecified complications: Principal | ICD-10-CM

## 2013-05-20 DIAGNOSIS — E1065 Type 1 diabetes mellitus with hyperglycemia: Secondary | ICD-10-CM

## 2013-05-20 DIAGNOSIS — R739 Hyperglycemia, unspecified: Secondary | ICD-10-CM

## 2013-05-20 DIAGNOSIS — E109 Type 1 diabetes mellitus without complications: Secondary | ICD-10-CM

## 2013-05-20 DIAGNOSIS — IMO0002 Reserved for concepts with insufficient information to code with codable children: Secondary | ICD-10-CM

## 2013-05-20 MED ORDER — INSULIN ASPART 100 UNIT/ML FLEXPEN: PEN_INJECTOR | SUBCUTANEOUS | Status: DC

## 2013-05-20 MED ORDER — DOXYCYCLINE HYCLATE 100 MG PO CAPS: 100.0000 mg | ORAL_CAPSULE | Freq: Two times a day (BID) | ORAL | Status: DC

## 2013-05-21 LAB — WOUND CULTURE

## 2013-05-26 ENCOUNTER — Encounter: Payer: Self-pay | Admitting: Internal Medicine

## 2013-05-26 ENCOUNTER — Ambulatory Visit: Payer: Medicaid Other | Admitting: Internal Medicine

## 2013-06-08 ENCOUNTER — Emergency Department: Payer: Self-pay | Admitting: Emergency Medicine

## 2013-07-05 ENCOUNTER — Inpatient Hospital Stay (HOSPITAL_COMMUNITY)
Admission: EM | Admit: 2013-07-05 | Discharge: 2013-07-07 | DRG: 639 | Disposition: A | Discharge status: Home / Self Care | Payer: Medicaid Other | Attending: Internal Medicine | Admitting: Internal Medicine

## 2013-07-05 ENCOUNTER — Encounter (HOSPITAL_COMMUNITY): Payer: Self-pay | Admitting: Emergency Medicine

## 2013-07-05 DIAGNOSIS — Z794 Long term (current) use of insulin: Secondary | ICD-10-CM

## 2013-07-05 DIAGNOSIS — E1110 Type 2 diabetes mellitus with ketoacidosis without coma: Secondary | ICD-10-CM

## 2013-07-05 DIAGNOSIS — Z825 Family history of asthma and other chronic lower respiratory diseases: Secondary | ICD-10-CM

## 2013-07-05 DIAGNOSIS — Z8349 Family history of other endocrine, nutritional and metabolic diseases: Secondary | ICD-10-CM

## 2013-07-05 DIAGNOSIS — Z833 Family history of diabetes mellitus: Secondary | ICD-10-CM

## 2013-07-05 DIAGNOSIS — R000 Tachycardia, unspecified: Secondary | ICD-10-CM | POA: Diagnosis present

## 2013-07-05 DIAGNOSIS — Z880 Allergy status to penicillin: Secondary | ICD-10-CM

## 2013-07-05 DIAGNOSIS — E1010 Type 1 diabetes mellitus with ketoacidosis without coma: Principal | ICD-10-CM | POA: Diagnosis present

## 2013-07-05 LAB — BASIC METABOLIC PANEL
BUN: 5 mg/dL — AB (ref 6–23)
BUN: 5 mg/dL — ABNORMAL LOW (ref 6–23)
BUN: 6 mg/dL (ref 6–23)
CALCIUM: 8.1 mg/dL — AB (ref 8.4–10.5)
CALCIUM: 8.7 mg/dL (ref 8.4–10.5)
CALCIUM: 8.3 mg/dL — AB (ref 8.4–10.5)
CO2: 23 mEq/L (ref 19–32)
CO2: 21 mEq/L (ref 19–32)
CO2: 23 mEq/L (ref 19–32)
CREATININE: 0.45 mg/dL — AB (ref 0.50–1.10)
CREATININE: 0.46 mg/dL — AB (ref 0.50–1.10)
Chloride: 106 mEq/L (ref 96–112)
Chloride: 103 mEq/L (ref 96–112)
Chloride: 103 mEq/L (ref 96–112)
Creatinine, Ser: 0.51 mg/dL (ref 0.50–1.10)
GFR calc Af Amer: >90 mL/min (ref >90)
GLUCOSE: 250 mg/dL — AB (ref 70–99)
Glucose, Bld: 82 mg/dL (ref 70–99)
Glucose, Bld: 153 mg/dL — ABNORMAL HIGH (ref 70–99)
Potassium: 3.1 mEq/L — ABNORMAL LOW (ref 3.7–5.3)
Potassium: 3.5 mEq/L — ABNORMAL LOW (ref 3.7–5.3)
Potassium: 3.6 mEq/L — ABNORMAL LOW (ref 3.7–5.3)
SODIUM: 139 meq/L (ref 137–147)
SODIUM: 139 meq/L (ref 137–147)
Sodium: 141 mEq/L (ref 137–147)

## 2013-07-05 LAB — URINALYSIS, ROUTINE W REFLEX MICROSCOPIC
BILIRUBIN URINE: NEGATIVE
Glucose, UA: >1000 mg/dL — AB
Hgb urine dipstick: NEGATIVE
Ketones, ur: 40 mg/dL — AB
Leukocytes, UA: NEGATIVE
Nitrite: NEGATIVE
Protein, ur: NEGATIVE mg/dL
Specific Gravity, Urine: 1.038 — ABNORMAL HIGH (ref 1.005–1.030)
Urobilinogen, UA: 0.2 mg/dL (ref 0.0–1.0)
pH: 6 (ref 5.0–8.0)

## 2013-07-05 LAB — I-STAT VENOUS BLOOD GAS, ED
Acid-base deficit: 2 mmol/L (ref 0.0–2.0)
BICARBONATE: 24.1 meq/L — AB (ref 20.0–24.0)
O2 Saturation: 99 %
TCO2: 25 mmol/L (ref 0–100)
pCO2, Ven: 43.3 mmHg — ABNORMAL LOW (ref 45.0–50.0)
pH, Ven: 7.355 — ABNORMAL HIGH (ref 7.250–7.300)
pO2, Ven: 128 mmHg — ABNORMAL HIGH (ref 30.0–45.0)

## 2013-07-05 LAB — CBC WITH DIFFERENTIAL/PLATELET
BASOS ABS: 0 10*3/uL (ref 0.0–0.1)
Basophils Relative: 0 % (ref 0–1)
EOS ABS: 0.2 10*3/uL (ref 0.0–0.7)
Eosinophils Relative: 4 % (ref 0–5)
HCT: 36.6 % (ref 36.0–46.0)
Hemoglobin: 13.2 g/dL (ref 12.0–15.0)
LYMPHS PCT: 40 % (ref 12–46)
Lymphs Abs: 2.4 10*3/uL (ref 0.7–4.0)
MCH: 28.2 pg (ref 26.0–34.0)
MCHC: 36.1 g/dL — AB (ref 30.0–36.0)
MCV: 78.2 fL (ref 78.0–100.0)
Monocytes Absolute: 0.5 10*3/uL (ref 0.1–1.0)
Monocytes Relative: 8 % (ref 3–12)
NEUTROS PCT: 48 % (ref 43–77)
Neutro Abs: 3 10*3/uL (ref 1.7–7.7)
PLATELETS: 173 10*3/uL (ref 150–400)
RBC: 4.68 MIL/uL (ref 3.87–5.11)
RDW: 12.7 % (ref 11.5–15.5)
WBC: 6.1 10*3/uL (ref 4.0–10.5)

## 2013-07-05 LAB — URINE MICROSCOPIC-ADD ON

## 2013-07-05 LAB — CBC
HCT: 33.4 % — ABNORMAL LOW (ref 36.0–46.0)
Hemoglobin: 12.1 g/dL (ref 12.0–15.0)
MCH: 28.4 pg (ref 26.0–34.0)
MCHC: 36.2 g/dL — ABNORMAL HIGH (ref 30.0–36.0)
MCV: 78.4 fL (ref 78.0–100.0)
PLATELETS: 267 10*3/uL (ref 150–400)
RBC: 4.26 MIL/uL (ref 3.87–5.11)
RDW: 12.5 % (ref 11.5–15.5)
WBC: 8.4 10*3/uL (ref 4.0–10.5)

## 2013-07-05 LAB — COMPREHENSIVE METABOLIC PANEL
ALK PHOS: 74 U/L (ref 39–117)
ALT: 14 U/L (ref 0–35)
AST: 18 U/L (ref 0–37)
Albumin: 3.6 g/dL (ref 3.5–5.2)
BILIRUBIN TOTAL: 0.3 mg/dL (ref 0.3–1.2)
BUN: 8 mg/dL (ref 6–23)
CHLORIDE: 83 meq/L — AB (ref 96–112)
CO2: 19 mEq/L (ref 19–32)
CREATININE: 0.48 mg/dL — AB (ref 0.50–1.10)
Calcium: 9.3 mg/dL (ref 8.4–10.5)
GFR calc Af Amer: >90 mL/min (ref >90)
GFR calc non Af Amer: >90 mL/min (ref >90)
Glucose, Bld: 802 mg/dL (ref 70–99)
POTASSIUM: 4 meq/L (ref 3.7–5.3)
Sodium: 124 mEq/L — ABNORMAL LOW (ref 137–147)
Total Protein: 7.1 g/dL (ref 6.0–8.3)

## 2013-07-05 LAB — GLUCOSE, CAPILLARY
GLUCOSE-CAPILLARY: 219 mg/dL — AB (ref 70–99)
GLUCOSE-CAPILLARY: 116 mg/dL — AB (ref 70–99)
GLUCOSE-CAPILLARY: 121 mg/dL — AB (ref 70–99)
GLUCOSE-CAPILLARY: 254 mg/dL — AB (ref 70–99)
Glucose-Capillary: 109 mg/dL — ABNORMAL HIGH (ref 70–99)
Glucose-Capillary: 159 mg/dL — ABNORMAL HIGH (ref 70–99)
Glucose-Capillary: 177 mg/dL — ABNORMAL HIGH (ref 70–99)
Glucose-Capillary: 242 mg/dL — ABNORMAL HIGH (ref 70–99)

## 2013-07-05 LAB — I-STAT CHEM 8, ED
BUN: 7 mg/dL (ref 6–23)
CALCIUM ION: 1.15 mmol/L (ref 1.12–1.23)
Chloride: 90 mEq/L — ABNORMAL LOW (ref 96–112)
Creatinine, Ser: 0.6 mg/dL (ref 0.50–1.10)
Glucose, Bld: >700 mg/dL (ref 70–99)
HCT: 43 % (ref 36.0–46.0)
Hemoglobin: 14.6 g/dL (ref 12.0–15.0)
POTASSIUM: 3.8 meq/L (ref 3.7–5.3)
SODIUM: 126 meq/L — AB (ref 137–147)
TCO2: 23 mmol/L (ref 0–100)

## 2013-07-05 LAB — POC URINE PREG, ED: PREG TEST UR: NEGATIVE

## 2013-07-05 LAB — CBG MONITORING, ED
GLUCOSE-CAPILLARY: 564 mg/dL — AB (ref 70–99)
GLUCOSE-CAPILLARY: 386 mg/dL — AB (ref 70–99)
GLUCOSE-CAPILLARY: 268 mg/dL — AB (ref 70–99)
Glucose-Capillary: >600 mg/dL (ref 70–99)

## 2013-07-05 LAB — MRSA PCR SCREENING: MRSA BY PCR: NEGATIVE

## 2013-07-05 LAB — I-STAT CG4 LACTIC ACID, ED: Lactic Acid, Venous: 1.2 mmol/L (ref 0.5–2.2)

## 2013-07-05 LAB — LIPASE, BLOOD: Lipase: 28 U/L (ref 11–59)

## 2013-07-05 MED ORDER — SODIUM CHLORIDE 0.9 % IV BOLUS (SEPSIS)
1000.0000 mL | Freq: Once | INTRAVENOUS | Status: AC
  Administered 2013-07-05: 1000 mL via INTRAVENOUS

## 2013-07-05 MED ORDER — DEXTROSE-NACL 5-0.45 % IV SOLN
INTRAVENOUS | Status: DC
  Administered 2013-07-05 – 2013-07-06 (×3): via INTRAVENOUS

## 2013-07-05 MED ORDER — POTASSIUM CHLORIDE 10 MEQ/100ML IV SOLN
10.0000 meq | INTRAVENOUS | Status: AC
  Administered 2013-07-05: 10 meq via INTRAVENOUS
  Filled 2013-07-05: qty 100

## 2013-07-05 MED ORDER — ENOXAPARIN SODIUM 40 MG/0.4ML ~~LOC~~ SOLN
40.0000 mg | SUBCUTANEOUS | Status: DC
  Administered 2013-07-06: 40 mg via SUBCUTANEOUS
  Filled 2013-07-05 (×3): qty 0.4

## 2013-07-05 MED ORDER — SODIUM CHLORIDE 0.9 % IV SOLN
INTRAVENOUS | Status: AC
  Administered 2013-07-05: 15:00:00 via INTRAVENOUS

## 2013-07-05 MED ORDER — SODIUM CHLORIDE 0.9 % IV SOLN
INTRAVENOUS | Status: DC
  Administered 2013-07-05: 17:00:00 via INTRAVENOUS

## 2013-07-05 MED ORDER — POTASSIUM CHLORIDE 10 MEQ/100ML IV SOLN
10.0000 meq | INTRAVENOUS | Status: AC
  Administered 2013-07-05 (×2): 10 meq via INTRAVENOUS
  Filled 2013-07-05: qty 100

## 2013-07-05 MED ORDER — ONDANSETRON HCL 4 MG/2ML IJ SOLN
4.0000 mg | Freq: Once | INTRAMUSCULAR | Status: AC
  Administered 2013-07-05: 4 mg via INTRAVENOUS
  Filled 2013-07-05: qty 2

## 2013-07-05 MED ORDER — DEXTROSE 50 % IV SOLN: 25.0000 mL | INTRAVENOUS | Status: DC | PRN

## 2013-07-05 MED ORDER — SODIUM CHLORIDE 0.9 % IV SOLN
INTRAVENOUS | Status: DC
  Administered 2013-07-06: 1 [IU]/h via INTRAVENOUS
  Filled 2013-07-05: qty 1

## 2013-07-05 MED ORDER — ONDANSETRON HCL 4 MG/2ML IJ SOLN: 4.0000 mg | Freq: Four times a day (QID) | INTRAMUSCULAR | Status: DC | PRN

## 2013-07-05 MED ORDER — SODIUM CHLORIDE 0.9 % IV SOLN
INTRAVENOUS | Status: DC
  Administered 2013-07-05: 5 [IU]/h via INTRAVENOUS
  Administered 2013-07-05: 2.1 [IU]/h via INTRAVENOUS
  Filled 2013-07-05: qty 1

## 2013-07-05 NOTE — Progress Notes (Signed)
Pt refused lovenox injection, would like to have a diet, notified md on call and also notified pt due to her dka need to be npo at this moment.

## 2013-07-05 NOTE — ED Notes (Signed)
Phlebotomy at bedside.

## 2013-07-05 NOTE — ED Notes (Signed)
IV team paged.  

## 2013-07-05 NOTE — ED Notes (Signed)
This RN tried twice for IV with no success.  2nd RN to try stick.

## 2013-07-05 NOTE — ED Provider Notes (Signed)
CSN: [id number]     Arrival date & time 07/05/13  1127 History   First MD Initiated Contact with Patient 07/05/13 1144     Chief Complaint  Patient presents with  . Abdominal Pain  . Nausea     (Consider location/radiation/quality/duration/timing/severity/associated sxs/prior Treatment) HPI Comments: Patient is a 23 year old female with a past medical history of diabetes type 1 who presents to the emergency department complaining of nausea x3 days with associated generalized abdominal pain beginning yesterday. Pain described as cramping. No aggravating or alleviating factors. States she forgot to take her insulin with her yesterday and did not take her evening dose. Admits she has not checked her blood sugar and while, and just takes her insulin when she is supposed to. Denies fever, chills, vomiting or diarrhea. States she has been very thirsty and tired, laying around more so than normal.  The history is provided by the patient.    Past Medical History  Diagnosis Date  . Gonorrhea 08/2011    Treated in 09/2011  . Diabetes mellitus 2001    Diagnosed at age 21 ; Type I   Past Surgical History  Procedure Laterality Date  . No past surgeries     Family History  Problem Relation Age of Onset  . Anesthesia problems Neg Hx   . Asthma Mother   . Gout Father   . Diabetes Paternal Grandmother    History  Substance Use Topics  . Smoking status: Never Smoker   . Smokeless tobacco: Never Used  . Alcohol Use: No   OB History   Grav Para Term Preterm Abortions TAB SAB Ect Mult Living   1 0   1  1   0     Review of Systems  Constitutional: Positive for fatigue.  Gastrointestinal: Positive for nausea and abdominal pain.  Endocrine: Positive for polydipsia.  All other systems reviewed and are negative.      Allergies  Penicillins  Home Medications   Current Outpatient Rx  Name  Route  Sig  Dispense  Refill  . insulin aspart (NOVOLOG FLEXPEN) 100 UNIT/ML FlexPen      Check  glucose three times a day before meals: 120-160: 2 units, 161-200: 3 units, 201-240: 4 units, 241-280: 5 units, 281- >/= 320: 7 units 321-360: 9 units 361-400: 11 units. If glucose remains > 400 units on 2 times you check it call the office   5 pen   6    BP 100/64  Pulse 84  Temp(Src) 98.3 F (36.8 C) (Oral)  Resp 18  SpO2 97%  LMP 06/18/2013 Physical Exam  Nursing note and vitals reviewed. Constitutional: She is oriented to person, place, and time. She appears well-developed and well-nourished. No distress.  HENT:  Head: Normocephalic and atraumatic.  Mouth/Throat: Oropharynx is clear and moist. Mucous membranes are dry.  Eyes: Conjunctivae are normal.  Neck: Normal range of motion. Neck supple.  Cardiovascular: Regular rhythm and normal heart sounds.  Tachycardia present.   Pulmonary/Chest: Effort normal and breath sounds normal.  Abdominal: Soft. Normal appearance and bowel sounds are normal. She exhibits no distension. There is generalized tenderness. There is no rigidity, no rebound and no guarding.  No peritoneal signs.  Musculoskeletal: Normal range of motion. She exhibits no edema.  Neurological: She is alert and oriented to person, place, and time.  Skin: Skin is warm and dry. She is not diaphoretic.  Psychiatric: She has a normal mood and affect. Her behavior is normal.    ED  Course  Procedures (including critical care time) Labs Review Labs Reviewed  CBC WITH DIFFERENTIAL - Abnormal; Notable for the following:    MCHC 36.1 (*)    All other components within normal limits  COMPREHENSIVE METABOLIC PANEL - Abnormal; Notable for the following:    Sodium 124 (*)    Chloride 83 (*)    Glucose, Bld 802 (*)    Creatinine, Ser 0.48 (*)    All other components within normal limits  CBG MONITORING, ED - Abnormal; Notable for the following:    Glucose-Capillary >600 (*)    All other components within normal limits  I-STAT VENOUS BLOOD GAS, ED - Abnormal; Notable for the  following:    pH, Ven 7.355 (*)    pCO2, Ven 43.3 (*)    pO2, Ven 128.0 (*)    Bicarbonate 24.1 (*)    All other components within normal limits  I-STAT CHEM 8, ED - Abnormal; Notable for the following:    Sodium 126 (*)    Chloride 90 (*)    Glucose, Bld >700 (*)    All other components within normal limits  CBG MONITORING, ED - Abnormal; Notable for the following:    Glucose-Capillary 564 (*)    All other components within normal limits  LIPASE, BLOOD  URINALYSIS, ROUTINE W REFLEX MICROSCOPIC  POC URINE PREG, ED  I-STAT CG4 LACTIC ACID, ED   Imaging Review No results found.   EKG Interpretation None      MDM   Final diagnoses:  DKA (diabetic ketoacidoses)   Patient presenting with abdominal pain, nausea and hyperglycemia. CBG on arrival greater than 600. Mild tachycardia on exam. Patient possibly and DKA. Labs pending. Will give fluid bolus. 1:49 PM Glucose on CMP 802, potassium 4.0. Insulin drip started. Anion gap 22. Pt stable, remains in NAD, VSS. Patient will be admitted for DKA. Admission accepted by IMTS.  Case discussed with attending Dr. Mingo Amber who also evaluated patient and agrees with plan of care.   Illene Labrador, PA-C 07/05/13 1350

## 2013-07-05 NOTE — ED Notes (Signed)
Critical lab of Glucose = 802 per Donavan Foil

## 2013-07-05 NOTE — ED Notes (Signed)
Pt reports nausea for several days, no vomiting or diarrhea. Having generalized abd pain since yesterday.

## 2013-07-05 NOTE — ED Notes (Signed)
I Stat Lactic Acid and I Stat Chem 8 results shown to [employer] PA

## 2013-07-05 NOTE — Progress Notes (Signed)
MD notified of 1903 blood work. Stated that they wanted two consecutive chem profiles with the anion gap closed. Will continue to monitor.   Lum Babe, RN

## 2013-07-05 NOTE — H&P (Signed)
Date: 07/05/2013               Patient Name:  Ashley Freeman MRN: [id number]  DOB: [birth year] Age / Sex: 23 y.o., female   PCP: Cresenciano Genre, MD         Medical Service: Internal Medicine Teaching Service         Attending Physician: Dr. Axel Filler, MD    First Contact: Dr. Stann Mainland Pager: [phone]  Second Contact: Dr. Algis Liming Pager: [phone]       After Hours (After 5p/  First Contact Pager: [phone]  weekends / holidays): Second Contact Pager: [phone]   Chief Complaint: nausea  History of Present Illness:  [name] is a 23 year old woman with uncontrolled DM1 (diagnosed at age 43; A1C 17% in 04/2013) who presents with nausea x 3 days.  She has not had any episodes of emesis and is no longer feeling nauseated since receiving anti-emetics.  She does have associated crampy abdominal pain, not improved or worsened by anything.  She has also been feeling very thirsty and reports polydipsia with decreased appetite.  Denies fever, chest pain, dyspnea, diarrhea, polyuria, dysuria.  She did not take her insulin yesterday, and she has not been checking her blood sugar at home.  She states her outpatient insulin regimen is currently 70/30 insulin BID according to sliding scale (did not bring SS with her), but she does not think this is working because she feels like her sugars are still running high.  She cannot afford Lantus right now because she has not heard back about whether her Medicaid was renewed.  Last insulin regimen on record was Lantus 20 units q12h and Novolog SSI TID (discharged on 05/19/13 and has not seen PCP since).    In ED, labs consistent with DKA: BMP showed glucose 802, HCO3 19, anion gap 22.  Venous pH 7.35, lactic acid within normal limits. + ketonuria, urine pregnancy test negative. CBC showed WBC 6.1. Patient was given two NS 1 L boluses, started on insulin gtt, and IMTS called for admission.  Most recent CBG 564.    Meds: Current Facility-Administered  Medications  Medication Dose Route Frequency Provider Last Rate Last Dose  . insulin regular (NOVOLIN R,HUMULIN R) 1 Units/mL in sodium chloride 0.9 % 100 mL infusion   Intravenous Continuous Illene Labrador, PA-C 5 mL/hr at 07/05/13 1328 5 Units/hr at 07/05/13 1328   Current Outpatient Prescriptions  Medication Sig Dispense Refill  . insulin aspart (NOVOLOG FLEXPEN) 100 UNIT/ML FlexPen Check glucose three times a day before meals: 120-160: 2 units, 161-200: 3 units, 201-240: 4 units, 241-280: 5 units, 281- >/= 320: 7 units 321-360: 9 units 361-400: 11 units. If glucose remains > 400 units on 2 times you check it call the office  5 pen  6    Allergies: Allergies as of 07/05/2013 - Review Complete 07/05/2013  Allergen Reaction Noted  . Penicillins Hives 11/11/2010   Past Medical History  Diagnosis Date  . Gonorrhea 08/2011    Treated in 09/2011  . Diabetes mellitus 2001    Diagnosed at age 64 ; Type I   Past Surgical History  Procedure Laterality Date  . No past surgeries     Family History  Problem Relation Age of Onset  . Anesthesia problems Neg Hx   . Asthma Mother   . Gout Father   . Diabetes Paternal Grandmother    History   Social History  . Marital Status: Single  Spouse Name: N/A    Number of Children: 0  . Years of Education: 11th grade   Occupational History  . unemployed     has never worked   Social History Main Topics  . Smoking status: Never Smoker   . Smokeless tobacco: Never Used  . Alcohol Use: No  . Drug Use: No  . Sexual Activity: Yes    Birth Control/ Protection: None     Comment: As of 11/06/11 1 partner (female) not using protection   Other Topics Concern  . Not on file   Social History Narrative   Patient lives in [place] mother lives in [place].  Unemployed.  Previously worked for a IT consultant.  Completed 11 grade working on [employer]. Patient 3 brothers   Patient currently lives with her aunt.  She is now working at [employer].    Review of Systems: Review of Systems  Constitutional: Positive for malaise/fatigue. Negative for fever.  Eyes: Negative for blurred vision.  Respiratory: Negative for cough and shortness of breath.   Cardiovascular: Negative for chest pain and leg swelling.  Gastrointestinal: Positive for nausea and abdominal pain. Negative for vomiting.  Genitourinary: Negative for dysuria, frequency and hematuria.  Musculoskeletal: Negative for falls.  Neurological: Negative for dizziness, loss of consciousness, weakness and headaches.    Physical Exam: Blood pressure 100/64, pulse 84, temperature 98.3 F (36.8 C), temperature source Oral, resp. rate 18, last menstrual period 06/18/2013, SpO2 97.00%. General: alert, cooperative, and in no apparent distress HEENT: NCAT, vision grossly intact, oropharynx clear and non-erythematous  Neck: supple, no lymphadenopathy Lungs: clear to ascultation bilaterally, normal work of respiration, no wheezes, rales, ronchi Heart: regular rate and rhythm, no murmurs, gallops, or rubs Abdomen: minimal diffuse TTP, soft, non-distended, normal bowel sounds Extremities: 2+ DP/PT pulses bilaterally, no cyanosis, clubbing, or edema Neurologic: alert & oriented X3, cranial nerves II-XII intact, strength grossly intact, sensation intact to light touch   Lab results: Basic Metabolic Panel:  Recent Labs  07/05/13 1218 07/05/13 1236  NA 124* 126*  K 4.0 3.8  CL 83* 90*  CO2 19  --   GLUCOSE 802* >700*  BUN 8 7  CREATININE 0.48* 0.60  CALCIUM 9.3  --    Liver Function Tests:  Recent Labs  07/05/13 1218  AST 18  ALT 14  ALKPHOS 74  BILITOT 0.3  PROT 7.1  ALBUMIN 3.6    Recent Labs  07/05/13 1218  LIPASE 28   CBC:  Recent Labs  07/05/13 1218 07/05/13 1236  WBC 6.1  --   NEUTROABS 3.0  --   HGB 13.2 14.6  HCT 36.6 43.0  MCV 78.2  --   PLT 173  --    CBG:  Recent Labs  07/05/13 1142 07/05/13 1314  GLUCAP >600* 564*    Other  results: EKG: pending  Assessment & Plan by Problem: 23 year old with uncontrolled DM1 (A1C 17% in 04/2013) presenting in DKA.  #DKA- On admission, glucose 802, HCO3 19, AG 22, + ketonuria.  No evidence of infection, afebrile, no tachycardia or tachypnea, no leucocytosis.  DKA was likely precipitated by lack of insulin for the past two days (or longer).  She is feeling much better since fluids and insulin treatment.  -admit to IMTS step down on telemetry  -DKA protocol (continue insulin gtt, IVFs, NPO)  -NSS --> D5-0.5NSS once BG < 250  -BMETs q2h until AG closed x 2 -monitor K and replete as necessary  -start [place] insulin once  AG closed x 2   -continue insulin drip for 2 hours after  insulin started  -EKG pending -consult to social work, diabetes educator   #DVT PPX- lovenox   Dispo: Disposition is deferred at this time, awaiting improvement of current medical problems. Anticipated discharge in approximately 1-2 day(s).   The patient does have a current PCP Cresenciano Genre, MD) and does need an [facility] hospital follow-up appointment after discharge.   Signed: Ivin Poot, MD 07/05/2013, 1:46 PM

## 2013-07-05 NOTE — ED Provider Notes (Signed)
Medical screening examination/treatment/procedure(s) were conducted as a shared visit with non-physician practitioner(s) and myself.  I personally evaluated the patient during the encounter.   EKG Interpretation None       Patient here with hyperglycemia. Missed her Lantus last night. Mild tachycardia here. No kussmaul respirations, no abdominal pain. Anion gap of 22. Admitted.   Osvaldo Shipper, MD 07/05/13 [phone]

## 2013-07-06 LAB — BASIC METABOLIC PANEL
BUN: 7 mg/dL (ref 6–23)
BUN: 8 mg/dL (ref 6–23)
BUN: 8 mg/dL (ref 6–23)
BUN: 7 mg/dL (ref 6–23)
BUN: 7 mg/dL (ref 6–23)
BUN: 5 mg/dL — AB (ref 6–23)
BUN: 5 mg/dL — ABNORMAL LOW (ref 6–23)
BUN: 6 mg/dL (ref 6–23)
CALCIUM: 8.3 mg/dL — AB (ref 8.4–10.5)
CALCIUM: 8.7 mg/dL (ref 8.4–10.5)
CALCIUM: 8.8 mg/dL (ref 8.4–10.5)
CHLORIDE: 104 meq/L (ref 96–112)
CHLORIDE: 105 meq/L (ref 96–112)
CHLORIDE: 105 meq/L (ref 96–112)
CHLORIDE: 98 meq/L (ref 96–112)
CO2: 23 meq/L (ref 19–32)
CO2: 21 mEq/L (ref 19–32)
CO2: 20 mEq/L (ref 19–32)
CO2: 22 mEq/L (ref 19–32)
CO2: 19 meq/L (ref 19–32)
CO2: 17 meq/L — AB (ref 19–32)
CO2: 24 mEq/L (ref 19–32)
CO2: 24 mEq/L (ref 19–32)
CREATININE: 0.47 mg/dL — AB (ref 0.50–1.10)
CREATININE: 0.43 mg/dL — AB (ref 0.50–1.10)
CREATININE: 0.43 mg/dL — AB (ref 0.50–1.10)
CREATININE: 0.42 mg/dL — AB (ref 0.50–1.10)
CREATININE: 0.42 mg/dL — AB (ref 0.50–1.10)
CREATININE: 0.42 mg/dL — AB (ref 0.50–1.10)
Calcium: 8.5 mg/dL (ref 8.4–10.5)
Calcium: 8.2 mg/dL — ABNORMAL LOW (ref 8.4–10.5)
Calcium: 8.2 mg/dL — ABNORMAL LOW (ref 8.4–10.5)
Calcium: 8.3 mg/dL — ABNORMAL LOW (ref 8.4–10.5)
Calcium: 8.4 mg/dL (ref 8.4–10.5)
Chloride: 105 mEq/L (ref 96–112)
Chloride: 104 mEq/L (ref 96–112)
Chloride: 101 mEq/L (ref 96–112)
Chloride: 99 mEq/L (ref 96–112)
Creatinine, Ser: 0.41 mg/dL — ABNORMAL LOW (ref 0.50–1.10)
Creatinine, Ser: 0.51 mg/dL (ref 0.50–1.10)
GFR calc Af Amer: >90 mL/min (ref >90)
GFR calc Af Amer: >90 mL/min (ref >90)
GFR calc Af Amer: >90 mL/min (ref >90)
GFR calc Af Amer: >90 mL/min (ref >90)
GFR calc Af Amer: >90 mL/min (ref >90)
GFR calc Af Amer: >90 mL/min (ref >90)
GFR calc non Af Amer: >90 mL/min (ref >90)
GFR calc non Af Amer: >90 mL/min (ref >90)
GFR calc non Af Amer: >90 mL/min (ref >90)
GFR calc non Af Amer: >90 mL/min (ref >90)
GFR calc non Af Amer: >90 mL/min (ref >90)
GLUCOSE: 148 mg/dL — AB (ref 70–99)
GLUCOSE: 205 mg/dL — AB (ref 70–99)
GLUCOSE: 186 mg/dL — AB (ref 70–99)
GLUCOSE: 97 mg/dL (ref 70–99)
Glucose, Bld: 122 mg/dL — ABNORMAL HIGH (ref 70–99)
Glucose, Bld: 261 mg/dL — ABNORMAL HIGH (ref 70–99)
Glucose, Bld: 121 mg/dL — ABNORMAL HIGH (ref 70–99)
Glucose, Bld: 204 mg/dL — ABNORMAL HIGH (ref 70–99)
POTASSIUM: 3.3 meq/L — AB (ref 3.7–5.3)
Potassium: 3.2 mEq/L — ABNORMAL LOW (ref 3.7–5.3)
Potassium: 3.7 mEq/L (ref 3.7–5.3)
Potassium: 3.3 mEq/L — ABNORMAL LOW (ref 3.7–5.3)
Potassium: 3.4 mEq/L — ABNORMAL LOW (ref 3.7–5.3)
Potassium: 4.3 mEq/L (ref 3.7–5.3)
Potassium: 3.6 mEq/L — ABNORMAL LOW (ref 3.7–5.3)
Potassium: 3.9 mEq/L (ref 3.7–5.3)
SODIUM: 137 meq/L (ref 137–147)
Sodium: 141 mEq/L (ref 137–147)
Sodium: 138 mEq/L (ref 137–147)
Sodium: 139 mEq/L (ref 137–147)
Sodium: 138 mEq/L (ref 137–147)
Sodium: 133 mEq/L — ABNORMAL LOW (ref 137–147)
Sodium: 135 mEq/L — ABNORMAL LOW (ref 137–147)
Sodium: 133 mEq/L — ABNORMAL LOW (ref 137–147)

## 2013-07-06 LAB — GLUCOSE, CAPILLARY
GLUCOSE-CAPILLARY: 96 mg/dL (ref 70–99)
GLUCOSE-CAPILLARY: 210 mg/dL — AB (ref 70–99)
GLUCOSE-CAPILLARY: 112 mg/dL — AB (ref 70–99)
GLUCOSE-CAPILLARY: 91 mg/dL (ref 70–99)
GLUCOSE-CAPILLARY: 317 mg/dL — AB (ref 70–99)
GLUCOSE-CAPILLARY: 300 mg/dL — AB (ref 70–99)
GLUCOSE-CAPILLARY: 166 mg/dL — AB (ref 70–99)
Glucose-Capillary: 108 mg/dL — ABNORMAL HIGH (ref 70–99)
Glucose-Capillary: 118 mg/dL — ABNORMAL HIGH (ref 70–99)
Glucose-Capillary: 121 mg/dL — ABNORMAL HIGH (ref 70–99)
Glucose-Capillary: 153 mg/dL — ABNORMAL HIGH (ref 70–99)
Glucose-Capillary: 154 mg/dL — ABNORMAL HIGH (ref 70–99)
Glucose-Capillary: 163 mg/dL — ABNORMAL HIGH (ref 70–99)
Glucose-Capillary: 166 mg/dL — ABNORMAL HIGH (ref 70–99)
Glucose-Capillary: 167 mg/dL — ABNORMAL HIGH (ref 70–99)
Glucose-Capillary: 167 mg/dL — ABNORMAL HIGH (ref 70–99)
Glucose-Capillary: 184 mg/dL — ABNORMAL HIGH (ref 70–99)
Glucose-Capillary: 185 mg/dL — ABNORMAL HIGH (ref 70–99)
Glucose-Capillary: 186 mg/dL — ABNORMAL HIGH (ref 70–99)
Glucose-Capillary: 208 mg/dL — ABNORMAL HIGH (ref 70–99)
Glucose-Capillary: 264 mg/dL — ABNORMAL HIGH (ref 70–99)

## 2013-07-06 MED ORDER — INSULIN GLARGINE 100 UNIT/ML ~~LOC~~ SOLN: 10.0000 [IU] | Freq: Every day | SUBCUTANEOUS | Status: DC

## 2013-07-06 MED ORDER — INSULIN GLARGINE 100 UNIT/ML ~~LOC~~ SOLN
5.0000 [IU] | Freq: Every day | SUBCUTANEOUS | Status: DC
  Filled 2013-07-06: qty 0.05

## 2013-07-06 MED ORDER — ACETAMINOPHEN 325 MG PO TABS: 325.0000 mg | ORAL_TABLET | Freq: Four times a day (QID) | ORAL | Status: DC | PRN

## 2013-07-06 MED ORDER — POTASSIUM CHLORIDE 10 MEQ/100ML IV SOLN
10.0000 meq | INTRAVENOUS | Status: AC
  Administered 2013-07-06: 10 meq via INTRAVENOUS
  Filled 2013-07-06: qty 100

## 2013-07-06 MED ORDER — POTASSIUM CHLORIDE 10 MEQ/100ML IV SOLN
10.0000 meq | INTRAVENOUS | Status: AC
  Administered 2013-07-06 (×3): 10 meq via INTRAVENOUS
  Filled 2013-07-06 (×2): qty 100

## 2013-07-06 NOTE — Progress Notes (Signed)
Pt states desire to go home and refuses further care/treatment. Educated and informed of risks of leaving. MD made aware. MD in to speak with patient and agrees to stay if placed on a diet. Continue to monitor. Eustace Moore

## 2013-07-06 NOTE — Progress Notes (Signed)
Inpatient Diabetes Program Recommendations  AACE/ADA: New Consensus Statement on Inpatient Glycemic Control (2013)  Target Ranges:  Prepandial:   less than 140 mg/dL      Peak postprandial:   less than 180 mg/dL (1-2 hours)      Critically ill patients:  140 - 180 mg/dL   Reason for Consult:  Further insulin education  Diabetes history: Type 1 diabetes since age 23 Outpatient Diabetes medications: Unclear.  Will discuss with patient Current orders for Inpatient glycemic control: Insulin drip via GlucoStabilizer  Note:  Diabetes Coordinator consult received.  Will visit patient this morning to complete assessment.  Note that Anion Gap per am labs is up to 14-- patient not ready to transition to subcutaneous insulin yet.  Thank you.  Royal Vandevoort S. Marcelline Mates, RN, CNS, CDE Inpatient Diabetes Program, team pager [phone]

## 2013-07-06 NOTE — Progress Notes (Addendum)
Inpatient Diabetes Program Recommendations  AACE/ADA: New Consensus Statement on Inpatient Glycemic Control (2013)  Target Ranges:  Prepandial:   less than 140 mg/dL      Peak postprandial:   less than 180 mg/dL (1-2 hours)      Critically ill patients:  140 - 180 mg/dL   Reason for Visit: MD Consult-- "further insulin education"  Diabetes history: Type 1 diabetes.  Last hospital admission was January 2015 Outpatient Diabetes medications: Novolog 70/30 Mix "When I eat-- twice a day"  Typically takes 7 or 8 units each time Current orders for Inpatient glycemic control: Insulin drip via GlucoStabilizer   Note:  Patient initially agreeable to my visit.  As my visit progressed she became less verbal.  States she takes 70/30 Mix by pens.  Supposed to take per sliding scale, but doesn't check her blood sugar.  Typically takes 7 to 8 units twice a day at breakfast and supper because "I know my sugar is always high".  Has 3 full pens remaining at home.  Doesn't know her Medicaid status.  Says she calls to check, but never gets an answer.  Not sure if she got her meter through Medicaid or not.  Doesn't know if she has any strips.  When asked what kind of meter she has, she replied "I don't know and I don't care".  Wanting to eat.  Said several times "I'm not doing this.  I'm going home."  States that she hasn't eaten in a day and a half and she is not going to go hungry.  When asked if she has ever seen a counselor about having diabetes, she replied "No" and further stated that "she doesn't like to talk and doesn't want to talk to a counselor".  Asked her directly if she was tired of talking to me.  She said again that she wanted to go home.  Attempted to explain that if she goes home before treatment for DKA is completed, she will have to come back to the hospital.  She said she would not come back.  Assessment: Patient obviously is angry regarding having diabetes.  Doubt she understands full  impact of diabetes on her body-- just knows she doesn't want to check blood sugars, etc to care for it.  States she doesn't miss taking insulin-- but at most takes only 16 units of 70/30 daily which would be about 0.3 units/kg.  Would recommend referral to an OP counselor regarding having chronic disease for better coping skills-- if patient were receptive.  Would also recommend an increase in 70/30 dosage at discharge, but patient may leave AMA.  Thank you.  Brandy Zuba S. Marcelline Mates, RN, CNS, CDE Inpatient Diabetes Program, team pager [phone]   Addendum:  If MD determines that patient can eat while on the insulin gtt, nurse can enter CHO's eaten into GlucoStabilzer and bolus insulin to cover her food so as not to negatively impact DKA treatment. Thank you.  Candance Bohlman S. Marcelline Mates, RN, CNS, CDE Inpatient Diabetes Program, team pager [phone]

## 2013-07-06 NOTE — Progress Notes (Signed)
Pt mother in room with outside food. Pt and family educated on carb modified and mother states food is for her. Mother states will notify if pt eats. Will continue to monitor. Eustace Moore

## 2013-07-06 NOTE — Progress Notes (Signed)
Was called that patient wanting to leave AMA. Pt very aggressive and exhibiting rude behavior (using profanity and pulling at tele and IVs) with RNs, Diabetes coordinator, and some physicians. Pt upset after encounter with diabetes coordinator when counseling was being provided. When interviewing staff that have interacted with the patient there are no immediate threats or concerns that patient's actions are a reflection of self-harming behaviors, pt has not exhibited any signs of delirium or AMS, pt not on any psychotropic drugs or opiates that could be etiologies.    When interviewing the patient she states "I just don't care just want to go home, you can't keep me here. No one can keep me against my will especially when I know what I want to do. " The patient appears to be fully competent able to identify and repeat the consequences and risks of leaving AMA given her condition including death. The pt was very tearful during interview and said that she has insulin at home, would be able to identify if she would clinically worsen and would know to present for direct immediate medical attention, that she had CBG meter and supplies at home for intense home monitoring, and would be in the care of her Aunt.   The pt was willing to try and stay if she was allowed to eat. This was discussed with the nursing staff, diabetes educator and attending physician. It was decided to allow a carb modified diet with ability to cover for number of carbohydrates using glucostabilizer. This is concerning given that the pt's AG still at 81 and this issue was presented to both the patient and staff.   If the patient has decided to leave against medical advice it was determined during this interview that the patient has a normal mental status, is not exhibiting any suicidal or homicidal ideation, and understands the risks of leaving, including permanent disability and/or death, and has had an opportunity to ask questions about her  medical condition. The patient has been informed that she may return for care at any time, and follow up has been arranged.  Clinton Gallant, MD PGY-2 Pgr: [phone]

## 2013-07-06 NOTE — H&P (Signed)
Internal Medicine Attending Admission Note Date: 07/06/2013  Patient name: Ashley Freeman Medical record number: [id number] Date of birth: 08/12/1990 Age: 23 y.o. Gender: female  I saw and evaluated the patient. I reviewed the resident's note and I agree with the resident's findings and plan as documented in the resident's note, with the following additional comments.  Chief Complaint(s): Nausea; crampy abdominal pain   History - key components related to admission: Patient is a 23 year old woman with history of type 1 diabetes mellitus, diabetic ketoacidosis, and other problems as outlined in the medical history admitted with complaint of nausea and crampy abdominal pain.  Patient states that she has been taking 70/30 insulin on a sliding scale at home; it is unclear how much insulin she has taken recently.  She was discharged home in January from the internal medicine service on Lantus insulin and NovoLog Flex pen, but apparently had difficulty affording those medications.   Physical Exam - key components related to admission:  Filed Vitals:   07/06/13 0257 07/06/13 0302 07/06/13 0700 07/06/13 0816  BP: 98/57 104/62  105/69  Pulse: 92 90    Temp: 98.4 F (36.9 C)  98.2 F (36.8 C) 97.9 F (36.6 C)  TempSrc: Oral  Oral Oral  Resp: 20 19    Height:      Weight:      SpO2: 99% 99%      General: Alert, no distress Lungs: Clear Heart: Regular; no extra sounds or murmurs Abdomen: Bowel sounds present, soft, nontender Extremities: No edema   Lab results:   Basic Metabolic Panel:  Recent Labs  07/06/13 0535 07/06/13 0750  NA 137 138  K 3.7 3.3*  CL 105 104  CO2 21 20  GLUCOSE 148* 205*  BUN 8 8  CREATININE 0.43* 0.41*  CALCIUM 8.2* 8.2*    Liver Function Tests:  Recent Labs  07/05/13 1218  AST 18  ALT 14  ALKPHOS 74  BILITOT 0.3  PROT 7.1  ALBUMIN 3.6    Recent Labs  07/05/13 1218  LIPASE 28     CBC:  Recent Labs  07/05/13 1218  07/05/13 1236 07/05/13 1903  WBC 6.1  --  8.4  HGB 13.2 14.6 12.1  HCT 36.6 43.0 33.4*  MCV 78.2  --  78.4  PLT 173  --  267    Recent Labs  07/05/13 1218  NEUTROABS 3.0  LYMPHSABS 2.4  MONOABS 0.5  EOSABS 0.2  BASOSABS 0.0    CBG:  Recent Labs  07/06/13 0356 07/06/13 0459 07/06/13 0556 07/06/13 0655 07/06/13 0811 07/06/13 0918  GLUCAP 96 108* 167* 210* 184* 153*    Hemoglobin A1C  Date Value Ref Range Status  05/16/2013 17.0* <5.7 % Final      Urinalysis    Component Value Date/Time   COLORURINE YELLOW 07/05/2013 1342   APPEARANCEUR CLEAR 07/05/2013 1342   LABSPEC 1.038* 07/05/2013 1342   PHURINE 6.0 07/05/2013 1342   GLUCOSEU >1000* 07/05/2013 1342   HGBUR NEGATIVE 07/05/2013 1342   BILIRUBINUR NEGATIVE 07/05/2013 1342   KETONESUR 40* 07/05/2013 1342   PROTEINUR NEGATIVE 07/05/2013 1342   UROBILINOGEN 0.2 07/05/2013 1342   NITRITE NEGATIVE 07/05/2013 1342   LEUKOCYTESUR NEGATIVE 07/05/2013 1342    Urine microscopic:  Recent Labs  07/05/13 1342  EPIU FEW*  WBCU 0-2  RBCU 0-2  BACTERIA RARE    Lab Results  Component Value Date   PREGTESTUR NEGATIVE 07/05/2013     Assessment & Plan by Problem:  1.  Diabetic ketoacidosis.  Patient has poorly controlled type 1 diabetes mellitus; DKA was likely precipitated by nonadherence to insulin.  Difficulty affording her medications is apparently a contributing factor.  Plan is IV volume replacement; DKA protocol using insulin drip and IV fluid with monitoring of labs and transition to subcutaneous insulin and diet once DKA has corrected.  Diabetes education and care management consult for assistance with medications are needed.  2.  Other problems and plans as per the resident physician's note.

## 2013-07-06 NOTE — Progress Notes (Signed)
Subjective: [name] is doing well this morning.  Hungry, no nausea, resting in bed.   Objective: Vital signs in last 24 hours: Filed Vitals:   07/06/13 0257 07/06/13 0302 07/06/13 0700 07/06/13 0816  BP: 98/57 104/62  105/69  Pulse: 92 90    Temp: 98.4 F (36.9 C)  98.2 F (36.8 C) 97.9 F (36.6 C)  TempSrc: Oral  Oral Oral  Resp: 20 19    Height:      Weight:      SpO2: 99% 99%     Weight change:   Intake/Output Summary (Last 24 hours) at 07/06/13 0858 Last data filed at 07/06/13 0816  Gross per 24 hour  Intake 3039.58 ml  Output      0 ml  Net 3039.58 ml   PEX General: alert, cooperative, and in no apparent distress HEENT: NCAT, vision grossly intact, oropharynx clear and non-erythematous  Neck: supple, no lymphadenopathy Lungs: clear to ascultation bilaterally, normal work of respiration, no wheezes, rales, ronchi Heart: regular rate and rhythm, no murmurs, gallops, or rubs Abdomen: soft, non-tender, non-distended, normal bowel sounds Extremities: 2+ DP/PT pulses bilaterally, no cyanosis, clubbing, or edema Neurologic: alert & oriented X3, cranial nerves II-XII intact, strength grossly intact, sensation intact to light touch  Lab Results: Basic Metabolic Panel:  Recent Labs Lab 07/06/13 0535 07/06/13 0750  NA 137 138  K 3.7 3.3*  CL 105 104  CO2 21 20  GLUCOSE 148* 205*  BUN 8 8  CREATININE 0.43* 0.41*  CALCIUM 8.2* 8.2*   Liver Function Tests:  Recent Labs Lab 07/05/13 1218  AST 18  ALT 14  ALKPHOS 74  BILITOT 0.3  PROT 7.1  ALBUMIN 3.6    Recent Labs Lab 07/05/13 1218  LIPASE 28   CBC:  Recent Labs Lab 07/05/13 1218 07/05/13 1236 07/05/13 1903  WBC 6.1  --  8.4  NEUTROABS 3.0  --   --   HGB 13.2 14.6 12.1  HCT 36.6 43.0 33.4*  MCV 78.2  --  78.4  PLT 173  --  267   CBG:  Recent Labs Lab 07/06/13 0255 07/06/13 0356 07/06/13 0459 07/06/13 0556 07/06/13 0655 07/06/13 0811  GLUCAP 154* 96 108* 167* 210* 184*     Urinalysis:  Recent Labs Lab 07/05/13 1342  COLORURINE YELLOW  LABSPEC 1.038*  PHURINE 6.0  GLUCOSEU >1000*  HGBUR NEGATIVE  BILIRUBINUR NEGATIVE  KETONESUR 40*  PROTEINUR NEGATIVE  UROBILINOGEN 0.2  NITRITE NEGATIVE  LEUKOCYTESUR NEGATIVE    Micro Results: Recent Results (from the past 240 hour(s))  MRSA PCR SCREENING     Status: None   Collection Time    07/05/13  5:30 PM      Result Value Ref Range Status   MRSA by PCR NEGATIVE  NEGATIVE Final   Comment:            The GeneXpert MRSA Assay (FDA     approved for NASAL specimens     only), is one component of a     comprehensive MRSA colonization     surveillance program. It is not     intended to diagnose MRSA     infection nor to guide or     monitor treatment for     MRSA infections.   Medications: I have reviewed the patient's current medications. Scheduled Meds: . enoxaparin (LOVENOX) injection  40 mg Subcutaneous Q24H  . insulin glargine  5 Units Subcutaneous Daily   Continuous Infusions: . sodium chloride  Stopped (07/05/13 1751)  . dextrose 5 % and 0.45% NaCl 125 mL/hr at 07/05/13 1751  . insulin (NOVOLIN-R) infusion 3.7 mL/hr at 07/06/13 0816   PRN Meds:.dextrose, ondansetron (ZOFRAN) IV Assessment/Plan: #DKA- On admission, glucose 802, HCO3 19, AG 22, + ketonuria.  No evidence of infection- afebrile, no tachycardia or tachypnea, no leucocytosis.  DKA likely precipitated by lack of insulin for the past two days (or longer).  Patient is feeling much better since fluids and insulin treatment, asking to eat.  Overnight, AG 12 --> 15 --> 13 --> 14 --> 11 --> 14 (HCO3 20).  K 3.3 this morning, supplemented with potassium IV 10 meq x 3.   -continue DKA protocol (continue insulin gtt, IVFs, NPO)  -D5-0.5NSS given BG < 250  -BMETs q2h until AG closed x 2  -monitor K and replete as necessary  -start [place] insulin (Lantus 10 units) once AG closed x 2  -continue insulin drip for 2 hours after [place] insulin started   -consult to diabetes educator for further insulin education   -consult to social work for [employer] issues  Dispo: Disposition is deferred at this time, awaiting improvement of current medical problems.  Anticipated discharge tomorrow.    The patient does have a current PCP Cresenciano Genre, MD) and does need an [facility] hospital follow-up appointment after discharge.   .Services Needed at time of discharge: Y = Yes, Blank = No PT:   OT:   RN:   Equipment:   Other:     LOS: 1 day   Ivin Poot, MD 07/06/2013, 8:58 AM.c

## 2013-07-07 DIAGNOSIS — E1110 Type 2 diabetes mellitus with ketoacidosis without coma: Secondary | ICD-10-CM

## 2013-07-07 LAB — TSH: TSH: 1.896 u[IU]/mL (ref 0.350–4.500)

## 2013-07-07 LAB — BASIC METABOLIC PANEL
BUN: 4 mg/dL — ABNORMAL LOW (ref 6–23)
BUN: 5 mg/dL — AB (ref 6–23)
BUN: 6 mg/dL (ref 6–23)
CALCIUM: 8.5 mg/dL (ref 8.4–10.5)
CALCIUM: 8.5 mg/dL (ref 8.4–10.5)
CHLORIDE: 104 meq/L (ref 96–112)
CO2: 20 mEq/L (ref 19–32)
CO2: 21 mEq/L (ref 19–32)
CO2: 23 mEq/L (ref 19–32)
CREATININE: 0.41 mg/dL — AB (ref 0.50–1.10)
CREATININE: 0.41 mg/dL — AB (ref 0.50–1.10)
Calcium: 8.4 mg/dL (ref 8.4–10.5)
Chloride: 100 mEq/L (ref 96–112)
Chloride: 103 mEq/L (ref 96–112)
Creatinine, Ser: 0.37 mg/dL — ABNORMAL LOW (ref 0.50–1.10)
GFR calc Af Amer: >90 mL/min (ref >90)
GFR calc Af Amer: >90 mL/min (ref >90)
GLUCOSE: 154 mg/dL — AB (ref 70–99)
Glucose, Bld: 112 mg/dL — ABNORMAL HIGH (ref 70–99)
Glucose, Bld: 287 mg/dL — ABNORMAL HIGH (ref 70–99)
POTASSIUM: 3.8 meq/L (ref 3.7–5.3)
Potassium: 3.6 mEq/L — ABNORMAL LOW (ref 3.7–5.3)
Potassium: 4.3 mEq/L (ref 3.7–5.3)
SODIUM: 135 meq/L — AB (ref 137–147)
SODIUM: 133 meq/L — AB (ref 137–147)
Sodium: 137 mEq/L (ref 137–147)

## 2013-07-07 LAB — GLUCOSE, CAPILLARY
GLUCOSE-CAPILLARY: 135 mg/dL — AB (ref 70–99)
GLUCOSE-CAPILLARY: 150 mg/dL — AB (ref 70–99)
GLUCOSE-CAPILLARY: 276 mg/dL — AB (ref 70–99)
GLUCOSE-CAPILLARY: 94 mg/dL (ref 70–99)
Glucose-Capillary: 233 mg/dL — ABNORMAL HIGH (ref 70–99)

## 2013-07-07 MED ORDER — INSULIN NPH ISOPHANE & REGULAR (70-30) 100 UNIT/ML ~~LOC~~ SUSP: SUBCUTANEOUS | Status: DC

## 2013-07-07 MED ORDER — POTASSIUM CHLORIDE 10 MEQ/100ML IV SOLN
10.0000 meq | Freq: Once | INTRAVENOUS | Status: AC
  Administered 2013-07-07: 10 meq via INTRAVENOUS
  Filled 2013-07-07: qty 100

## 2013-07-07 MED ORDER — INSULIN GLARGINE 100 UNIT/ML ~~LOC~~ SOLN
8.0000 [IU] | Freq: Once | SUBCUTANEOUS | Status: AC
  Administered 2013-07-07: 8 [IU] via SUBCUTANEOUS
  Filled 2013-07-07: qty 0.08

## 2013-07-07 MED ORDER — SODIUM CHLORIDE 0.9 % IV SOLN
INTRAVENOUS | Status: DC
  Administered 2013-07-07: 01:00:00 via INTRAVENOUS

## 2013-07-07 MED ORDER — INSULIN ASPART 100 UNIT/ML ~~LOC~~ SOLN: 0.0000 [IU] | Freq: Three times a day (TID) | SUBCUTANEOUS | Status: DC

## 2013-07-07 MED ORDER — INSULIN ASPART 100 UNIT/ML ~~LOC~~ SOLN: 3.0000 [IU] | Freq: Once | SUBCUTANEOUS | Status: DC

## 2013-07-07 MED ORDER — INSULIN ASPART 100 UNIT/ML ~~LOC~~ SOLN
0.0000 [IU] | Freq: Three times a day (TID) | SUBCUTANEOUS | Status: DC
  Administered 2013-07-07: 5 [IU] via SUBCUTANEOUS
  Administered 2013-07-07: 3 [IU] via SUBCUTANEOUS

## 2013-07-07 NOTE — Progress Notes (Signed)
Subjective: [name] is doing well this morning, minimally conversant, ready to go home.   Overnight, patient converted to subq Lantus 8 units after AG closed x 2.  AM BMPs again show closed AGs of 10, 12.   Objective: Vital signs in last 24 hours: Filed Vitals:   07/06/13 1947 07/06/13 2254 07/07/13 0505 07/07/13 0700  BP: 110/70 106/69 103/58   Pulse: 96 91 75   Temp: 98.3 F (36.8 C) 98.3 F (36.8 C) 98.5 F (36.9 C) 97.8 F (36.6 C)  TempSrc: Oral Oral Oral Oral  Resp: 27 22 18    Height:      Weight:      SpO2: 99% 100% 99%    Weight change:   Intake/Output Summary (Last 24 hours) at 07/07/13 0827 Last data filed at 07/07/13 0600  Gross per 24 hour  Intake 3893.19 ml  Output      0 ml  Net 3893.19 ml   PEX General: alert, cooperative, and in no apparent distress HEENT: NCAT, vision grossly intact, oropharynx clear and non-erythematous  Neck: supple, no lymphadenopathy Lungs: clear to ascultation bilaterally, normal work of respiration, no wheezes, rales, ronchi Heart: regular rate and rhythm, no murmurs, gallops, or rubs Abdomen: soft, non-tender, non-distended, normal bowel sounds Extremities: 2+ DP/PT pulses bilaterally, no cyanosis, clubbing, or edema Neurologic: alert & oriented X3, cranial nerves II-XII intact, strength grossly intact, sensation intact to light touch  Lab Results: Basic Metabolic Panel:  Recent Labs Lab 07/07/13 07/07/13 0334  NA 133* 137  K 3.6* 3.8  CL 100 104  CO2 21 23  GLUCOSE 154* 112*  BUN 6 5*  CREATININE 0.37* 0.41*  CALCIUM 8.5 8.4   Liver Function Tests:  Recent Labs Lab 07/05/13 1218  AST 18  ALT 14  ALKPHOS 74  BILITOT 0.3  PROT 7.1  ALBUMIN 3.6    Recent Labs Lab 07/05/13 1218  LIPASE 28   CBC:  Recent Labs Lab 07/05/13 1218 07/05/13 1236 07/05/13 1903  WBC 6.1  --  8.4  NEUTROABS 3.0  --   --   HGB 13.2 14.6 12.1  HCT 36.6 43.0 33.4*  MCV 78.2  --  78.4  PLT 173  --  267    CBG:  Recent Labs Lab 07/06/13 2058 07/06/13 2152 07/06/13 2252 07/07/13 07/07/13 0103 07/07/13 0205  GLUCAP 186* 264* 166* 150* 135* 94   Urinalysis:  Recent Labs Lab 07/05/13 1342  COLORURINE YELLOW  LABSPEC 1.038*  PHURINE 6.0  GLUCOSEU >1000*  HGBUR NEGATIVE  BILIRUBINUR NEGATIVE  KETONESUR 40*  PROTEINUR NEGATIVE  UROBILINOGEN 0.2  NITRITE NEGATIVE  LEUKOCYTESUR NEGATIVE    Micro Results: Recent Results (from the past 240 hour(s))  MRSA PCR SCREENING     Status: None   Collection Time    07/05/13  5:30 PM      Result Value Ref Range Status   MRSA by PCR NEGATIVE  NEGATIVE Final   Comment:            The GeneXpert MRSA Assay (FDA     approved for NASAL specimens     only), is one component of a     comprehensive MRSA colonization     surveillance program. It is not     intended to diagnose MRSA     infection nor to guide or     monitor treatment for     MRSA infections.   Medications: I have reviewed the patient's current medications. Scheduled Meds: .  enoxaparin (LOVENOX) injection  40 mg Subcutaneous Q24H  . insulin aspart  0-9 Units Subcutaneous TID WC   Continuous Infusions: . sodium chloride 100 mL/hr at 07/07/13 0043   PRN Meds:.dextrose, ondansetron (ZOFRAN) IV Assessment/Plan: #DKA- On admission, glucose 802, HCO3 19, AG 22, + ketonuria.  No evidence of infection- afebrile, no tachycardia or tachypnea, no leucocytosis.  DKA likely precipitated by lack of insulin for the past two days (or longer).  Overnight, AG 12 --> 11 thus patient converted to subq Lantus, insulin gtt discontinued 1 hour later.  Of note, patient required 71 units of insulin in 24 hours prior to conversion to subq.  AM BMPs today with AGs 10, 12. -CBGs AC, qhs; SSI-sensitive -consult to diabetes educator, appreciate recs; will discuss insulin regimen at discharge -consult to social work for Medicaid issues -close outpatient follow-up arranged  #Tachycardia- mild  tachycardia to 100s on monitor during interview and exam this morning -NS at 100 cc/hr through discharge -RN to check orthostatics -TSH, follow-up outpatient  Dispo:  Anticipated discharge today.    The patient does have a current PCP Ashley Genre, MD) and does need an [facility] hospital follow-up appointment after discharge.   .Services Needed at time of discharge: Y = Yes, Blank = No PT:   OT:   RN:   Equipment:   Other:     LOS: 2 days   Ivin Poot, MD 07/07/2013, 8:27 AM.c

## 2013-07-07 NOTE — Progress Notes (Addendum)
Inpatient Diabetes Program Recommendations  AACE/ADA: New Consensus Statement on Inpatient Glycemic Control (2013)  Target Ranges:  Prepandial:   less than 140 mg/dL      Peak postprandial:   less than 180 mg/dL (1-2 hours)      Critically ill patients:  140 - 180 mg/dL   Reason for Visit: Diabetes Coordinator Referral  Diabetes history: Type 1 diabetes since age 23 Outpatient Diabetes medications: Novolog Mix 70/30 via pen-- dosage unknown (Has said typically 5 units twice a day, 7 to 8 units twice a day and 10 units when she feels CBG is "High" Current orders for Inpatient glycemic control: Received Lantus 8 units when transitioned off insulin drip early in AM, Sensitive correction  Note:  Talked with patient at bedside.  Minimally conversant.  Encouraged her to keep the clinic appointment being made for her after discharge.  She replied, "If I don't have my Medicaid they won't see me.  That's what happened last time."  Told her that the physicians probably were unaware of her attempt to be seen and that even if she doesn't have Medicaid, there is another program for which she could apply called the "[employer]".  Encouraged her to see the Barnabas Harries, RD, CDE in the Clinic.  Suggested that she see her on a regular basis to try and improve her diabetes control.  (Patient needs someone constant to develop rapport and trust.  Would also suggest she see a mental health counselor familiar with stressors of chronic disease.)  Tried to discuss hypoglycemia with her.  She says that she has had low blood sugar before.  When I asked how she felt when her blood sugar was low, she said she "felt nothing".  Patient's home 70/30 regimen is unknown.  Whatever her regimen may be, it is not working and likely very inconsistent.  Of concern, as well, is patient's inconsistency with eating and lack of willingness to check her blood sugars at home.  She said that she "eats when she has time".  With pre-mixed  insulin, she will have a high risk for hypoglycemia if she takes her insulin without eating.  Typical post-pubertal Type 1 patients typically require under about 1 unit/kg.  The following are some examples of 70/30 dosages based on weight:  0.8 units/hr = 21 units 70/30 BID  0.7 units/hr = 18 units 70/30 BID  0.6 units/hr = 15 units 70/30 BID Given limitations of pre-mixed insulin, patient's unwillingness to eat consistently, and unwillingness to check CBG's, would recommend either 0.6 or 0.7 unit/kg at discharge with hope that patient will follow-up and establish rapport in clinic and receive some psychosocial support in the form of counseling in the future.  (Question if patient will consider taking as much as 15 or 18 units given her report of taking 10 units when she thinks she is "high".)  Thank you.  Abdulrahman Bracey S. Marcelline Mates, RN, CNS, CDE Inpatient Diabetes Program, team pager [phone]  Addendum: Patient has no meal coverage ordered.  TC to Dr. Stann Mainland and obtained order for Novolog 3 units with lunch.  Will likely start 70/30 regimen at supper.  Thank you.  Marchelle Rinella S. Marcelline Mates, RN, CNS, CDE Inpatient Diabetes Program, team pager [phone]

## 2013-07-07 NOTE — Progress Notes (Signed)
CBG this am 276- no SSI coverage ordered over night when gtt d/c'd. Notified resident-about change to SSI from gtt and the protocol with starting SSI once gtt d/c'd.  SSI with meal coverage now ordered. Will speak with diabetes coordinator also.   Pt VSS, no complains of pain, will continue to monitor.

## 2013-07-07 NOTE — Progress Notes (Signed)
Night Float Progress Note  Patient with AG 12 or less x 2.  Went to check on patient.  She was resting comfortably and w/o complaint.  Says tolerated po that was started earlier in the day.  Denies N/V, abdominal pain or diarrhea.  When asked about her insulin regimen at home she says she uses 70/30 insulin at home.  She does not check her BG and takes, on average, 5 units twice daily.  She takes 10 units if she feels like her BG is higher.    Vitals reviewed. General: resting in bed in NAD Cardiac:  RRR Lungs:  CTA B/L Abdomen:  +BS, soft, NT, ND Neuro:  AAO x 3, responding appropriately, able to move all four extremities voluntarily  23 year old woman with DM type 1 admitted with DKA.  Her AG has remained closed and patient without N/V or abdominal pain. - 8 units of Lantus ordered; primary team to adjust future doses - continue insulin drip for 2 hours after Lantus given - D5-0.5NSS --> NSS - BMP q2h --> q4h - will continue to monitor  Duwaine Maxin DO, PGY1

## 2013-07-07 NOTE — Discharge Instructions (Signed)
You should take 70/30 insulin 15 units twice daily.  Please check your blood sugars with meals and at bedtime.  Do not forget to bring your meter with you to your follow-up visit.  Please call us if your blood sugars are high or low, below is more information on symptoms of low blood sugar.  You should also try to eat meals regularly to avoid low blood sugars.   Don't forget your follow-up appointment with Dr. Aundra Dubin on Thursday morning at 8:15am.    Hypoglycemia (Low Blood Sugar) Hypoglycemia is when the glucose (sugar) in your blood is too low. Hypoglycemia can happen for many reasons. It can happen to people with or without diabetes. Hypoglycemia can develop quickly and can be a medical emergency.  CAUSES  Having hypoglycemia does not mean that you will develop diabetes. Different causes include:  Missed or delayed meals or not enough carbohydrates eaten.  Medication overdose. This could be by accident or deliberate. If by accident, your medication may need to be adjusted or changed.  Exercise or increased activity without adjustments in carbohydrates or medications.  A nerve disorder that affects body functions like your heart rate, blood pressure and digestion (autonomic neuropathy).  A condition where the stomach muscles do not function properly (gastroparesis). Therefore, medications may not absorb properly.  The inability to recognize the signs of hypoglycemia (hypoglycemic unawareness).  Absorption of insulin  may be altered.  Alcohol consumption.  Pregnancy/menstrual cycles/postpartum. This may be due to hormones.  Certain kinds of tumors. This is very rare. SYMPTOMS   Sweating.  Hunger.  Dizziness.  Blurred vision.  Drowsiness.  Weakness.  Headache.  Rapid heart beat.  Shakiness.  Nervousness. DIAGNOSIS  Diagnosis is made by monitoring blood glucose in one or all of the following ways:  Fingerstick blood glucose monitoring.  Laboratory  results. TREATMENT  If you think your blood glucose is low:  Check your blood glucose, if possible. If it is less than 70 mg/dl, take one of the following:  3-4 glucose tablets.   cup juice (prefer clear like apple).   cup "regular" soda pop.  1 cup milk.  -1 tube of glucose gel.  5-6 hard candies.  Do not over treat because your blood glucose (sugar) will only go too high.  Wait 15 minutes and recheck your blood glucose. If it is still less than 70 mg/dl (or below your target range), repeat treatment.  Eat a snack if it is more than one hour until your next meal. Sometimes, your blood glucose may go so low that you are unable to treat yourself. You may need someone to help you. You may even pass out or be unable to swallow. This may require you to get an injection of glucagon, which raises the blood glucose. HOME CARE INSTRUCTIONS  Check blood glucose as recommended by your caregiver.  Take medication as prescribed by your caregiver.  Follow your meal plan. Do not skip meals. Eat on time.  If you are going to drink alcohol, drink it only with meals.  Check your blood glucose before driving.  Check your blood glucose before and after exercise. If you exercise longer or different than usual, be sure to check blood glucose more frequently.  Always carry treatment with you. Glucose tablets are the easiest to carry.  Always wear medical alert jewelry or carry some form of identification that states that you have diabetes. This will alert people that you have diabetes. If you have hypoglycemia, they will have  a better idea on what to do. SEEK MEDICAL CARE IF:   You are having problems keeping your blood sugar at target range.  You are having frequent episodes of hypoglycemia.  You feel you might be having side effects from your medicines.  You have symptoms of an illness that is not improving after 3-4 days.  You notice a change in vision or a new problem with your  vision. SEEK IMMEDIATE MEDICAL CARE IF:   You are a family member or friend of a person whose blood glucose goes below 70 mg/dl and is accompanied by:  Confusion.  A change in mental status.  The inability to swallow.  Passing out. Document Released: 04/16/2005 Document Revised: 07/09/2011 Document Reviewed: 08/13/2011 [facility] Patient Information 2014 [place], [place].   Diabetic Ketoacidosis Diabetic ketoacidosis (DKA) is a life-threatening complication of type 1 diabetes. It must be quickly recognized and treated. Treatment requires hospitalization. CAUSES  When there is no insulin in the body, glucose (sugar) cannot be used and the body breaks down fat for energy. When fat breaks down, acids (ketones) build up in the blood. Very high levels of glucose and high levels of acids lead to severe loss of body fluids (dehydration) and other dangerous chemical changes. This stresses your vital organs and can cause coma or death. SYMPTOMS   Tiredness (fatigue).  Weight loss.  Excessive thirst.  Ketones in the urine.  Lightheadedness.  Fruity or sweet smell on your breath.  Excessive urination.  Visual changes.  Confusion or irritability.  Feeling sick to your stomach (nauseous) or vomiting.  Rapid breathing.  Stomachache or belly (abdominal) pain. DIAGNOSIS  Your caregiver will diagnose DKA based on your history, physical exam, and blood tests. Your caregiver will check if there is another illness present which caused you to go into DKA. Most of this will be done quickly in an emergency room. TREATMENT   Fluid replacement to correct dehydration.  Insulin.  Correction of electrolytes, such as potassium and sodium.  Medicines (antibiotics) that kill germs for infections. PREVENTION  Always take your insulin. Do not skip your insulin injections.  If you are ill, treat yourself quickly. Your body often needs more insulin to fight the illness.  Check your blood  glucose regularly.  Check urine ketones if your blood glucose is greater than 240 milligrams per deciliter (mg/dl).  Do not used expired or outdated insulin.  If your blood glucose is high, drink plenty of fluids. This helps flush out ketones. HOME CARE INSTRUCTIONS   If you are ill, follow the advice of your caregiver.  To prevent loss of body fluids (dehydration), drink enough water and fluids to keep your urine clear or pale yellow.  If you cannot eat, alternate between drinking fluids with sugar (soda, juices, flavored gelatin) and salty fluids (broth, bouillon).  If you can eat, follow your usual diet and drink sugar-free liquids (water, diet drinks).  Always take your usual dose of insulin. If you cannot eat, or your glucose is getting too low, call your caregiver for further instructions.  Continue to monitor your blood or urine ketones every 3 to 4 hours around the clock. Set your alarm clock or have someone wake you up. If you are too sick, have someone test it for you.  Rest and avoid exercise. SEEK MEDICAL CARE IF:   You have ketones in your urine or your blood glucose is higher than a level your caregiver suggests. You may need extra insulin. Call your caregiver if  you need advice on adjusting your insulin.  You cannot drink at least a tablespoon of fluid every 15 to 20 minutes.  You have been throwing up for more than 2 hours.  You have symptoms of DKA:  Fruity smelling breath.  Breathing faster or slower.  Becoming very sleepy. SEEK IMMEDIATE MEDICAL CARE IF:   You have signs of dehydration:  Decreased urination.  Increased thirst.  Dry skin and mouth.  Lightheadedness.  Your blood glucose is very high (as advised by your caregiver) twice in a row.  You or your child has an oral temperature above 102 F (38.9 C), not controlled by medicine.  You pass out.  You have chest pain and/or trouble breathing.  You have a sudden, severe headache.  You  have sudden weakness in one arm and/or one leg.  You have sudden difficulty speaking and/or swallowing.  You develop vomiting and/or diarrhea that is getting worse after 3 to 4 hours.  You have abdominal pain. MAKE SURE YOU:   Understand these instructions.  Will watch your condition.  Will get help right away if you are not doing well or get worse. Document Released: 04/13/2000 Document Revised: 07/09/2011 Document Reviewed: 10/20/2008 [facility] Patient Information 2014 [place], [place].

## 2013-07-07 NOTE — Discharge Summary (Signed)
Name: Ashley Freeman MRN: [id number] DOB: 1991-01-24 23 y.o. PCP: Cresenciano Genre, MD  Date of Admission: 07/05/2013 11:43 AM Date of Discharge: 07/07/2013 Attending Physician: Dr. Marinda Elk  Discharge Diagnosis: Active Problems:   DKA, type 1  Discharge Medications:   Medication List    STOP taking these medications       insulin aspart 100 UNIT/ML FlexPen  Commonly known as:  NOVOLOG FLEXPEN      TAKE these medications       insulin NPH-regular Human (70-30) 100 UNIT/ML injection  Commonly known as:  NOVOLIN 70/30  15 units BID        Disposition and follow-up:   Ashley Freeman was discharged from [facility] in Stable condition.  At the hospital follow up visit please address:  1.  Compliance with insulin   2.  Please schedule Ashley Freeman with Butch Penny Plyler  3.  Ask Ashley Freeman to follow-up with Marlana Latus re: orange card  4.  Labs / imaging needed at time of follow-up: none  5.  Pending labs/ test needing follow-up: TSH  Follow-up Appointments: Follow-up Information   Follow up with Cresenciano Genre, MD On 07/09/2013. (8:15am)    Specialty:  Internal Medicine   Contact information:   [address] [place] [address] [phone]       Discharge Instructions: Discharge Orders   Future Appointments Provider Department Dept Phone   07/09/2013 8:15 AM Cresenciano Genre, MD [place] [phone]   Future Orders Complete By Expires   Call MD for:  persistant dizziness or light-headedness  As directed    Diet - low sodium heart healthy  As directed    Increase activity slowly  As directed       Consultations:  diabetes educator   Admission HPI:  Ashley Freeman is a 23 year old woman with uncontrolled DM1 (diagnosed at age 23; A1C 17% in 04/2013) who presents with nausea x 3 days. She has not had any episodes of emesis and is no longer feeling nauseated since receiving anti-emetics. She does have associated  crampy abdominal pain, not improved or worsened by anything. She has also been feeling very thirsty and reports polydipsia with decreased appetite. Denies fever, chest pain, dyspnea, diarrhea, polyuria, dysuria. She did not take her insulin yesterday, and she has not been checking her blood sugar at home. She states her outpatient insulin regimen is currently 70/30 insulin BID according to sliding scale (did not bring SS with her), but she does not think this is working because she feels like her sugars are still running high. She cannot afford Lantus right now because she has not heard back about whether her Medicaid was renewed. Last insulin regimen on record was Lantus 20 units q12h and Novolog SSI TID (discharged on 05/19/13 and has not seen PCP since).  In ED, labs consistent with DKA: BMP showed glucose 802, HCO3 19, anion gap 22. Venous pH 7.35, lactic acid within normal limits. + ketonuria, urine pregnancy test negative. CBC showed WBC 6.1. Ashley Freeman was given two NS 1 L boluses, started on insulin gtt, and IMTS called for admission. Most recent CBG 564.    Hospital Course by problem list: 1. DKA- Ashley Freeman presented with nausea x 3 days.  On admission, glucose 802, HCO3 19, anion gap 22, + ketonuria, consistent with DKA.  No evidence of infection as Ashley Freeman afebrile, no tachycardia or tachypnea, no leucocytosis.  DKA likely precipitated by lack of  insulin for two days (or longer) prior to admission.  Unclear exactly what her current outpatient insulin regimen is (no longer taking Lantus BID with Novolog as she was prescribed at hospital discharge in 04/2013); Ashley Freeman reports that she is now taking 70/30 insulin anywhere from 5-20 units twice daily based on how high she feels her blood sugar is, refuses to check her blood sugar at home though she has supplies.  While inpatient, Ashley Freeman was placed on DKA protocol, anion gap closed x 2 by evening of 3/9, and Ashley Freeman converted to Lantus subq 8 units.  Of note,  she required 71 units of insulin in 24 hours prior to conversion to subq.  Anion gap remained closed day of discharge.  After discussion with inpatient diabetes educator, decided safest insulin regimen to discharge Ashley Freeman on would be 70/30 insulin 15 units BID (0.6 units/kg).  Also spoke with Debera Lat in [facility] about this case, she will continue to follow and provide assistance.  Case management was looking into Ashley Freeman's Medicaid status though seems unlikely it will be renewed given Ashley Freeman's age, no children, and current employment; will send any updates to PCP via Epic message.  Close PCP follow-up arranged.     Discharge Vitals:   BP 119/90  Pulse 81  Temp(Src) 98.4 F (36.9 C) (Oral)  Resp 18  Ht 5\' 3"  (1.6 m)  Wt 114 lb 10.2 oz (52 kg)  BMI 20.31 kg/m2  SpO2 99%  LMP 06/18/2013  Discharge Labs:  Results for orders placed during the hospital encounter of 07/05/13 (from the past 24 hour(s))  GLUCOSE, CAPILLARY     Status: Abnormal   Collection Time    07/06/13  6:52 PM      Result Value Ref Range   Glucose-Capillary 185 (*) 70 - 99 mg/dL  BASIC METABOLIC PANEL     Status: Abnormal   Collection Time    07/06/13  7:20 PM      Result Value Ref Range   Sodium 135 (*) 137 - 147 mEq/L   Potassium 3.6 (*) 3.7 - 5.3 mEq/L   Chloride 99  96 - 112 mEq/L   CO2 24  19 - 32 mEq/L   Glucose, Bld 121 (*) 70 - 99 mg/dL   BUN 5 (*) 6 - 23 mg/dL   Creatinine, Ser 0.42 (*) 0.50 - 1.10 mg/dL   Calcium 8.7  8.4 - 10.5 mg/dL   GFR calc non Af Amer >90  >90 mL/min   GFR calc Af Amer >90  >90 mL/min  GLUCOSE, CAPILLARY     Status: Abnormal   Collection Time    07/06/13  7:53 PM      Result Value Ref Range   Glucose-Capillary 118 (*) 70 - 99 mg/dL   Comment 1 Documented in Chart     Comment 2 Notify RN    GLUCOSE, CAPILLARY     Status: Abnormal   Collection Time    07/06/13  8:58 PM      Result Value Ref Range   Glucose-Capillary 186 (*) 70 - 99 mg/dL   Comment 1 Documented in Chart      Comment 2 Notify RN    BASIC METABOLIC PANEL     Status: Abnormal   Collection Time    07/06/13  9:25 PM      Result Value Ref Range   Sodium 133 (*) 137 - 147 mEq/L   Potassium 3.9  3.7 - 5.3 mEq/L   Chloride 98  96 -  112 mEq/L   CO2 24  19 - 32 mEq/L   Glucose, Bld 204 (*) 70 - 99 mg/dL   BUN 6  6 - 23 mg/dL   Creatinine, Ser 0.42 (*) 0.50 - 1.10 mg/dL   Calcium 8.8  8.4 - 10.5 mg/dL   GFR calc non Af Amer >90  >90 mL/min   GFR calc Af Amer >90  >90 mL/min  GLUCOSE, CAPILLARY     Status: Abnormal   Collection Time    07/06/13  9:52 PM      Result Value Ref Range   Glucose-Capillary 264 (*) 70 - 99 mg/dL   Comment 1 Documented in Chart     Comment 2 Notify RN    GLUCOSE, CAPILLARY     Status: Abnormal   Collection Time    07/06/13 10:52 PM      Result Value Ref Range   Glucose-Capillary 166 (*) 70 - 99 mg/dL   Comment 1 Documented in Chart     Comment 2 Notify RN    BASIC METABOLIC PANEL     Status: Abnormal   Collection Time    07/07/13 12:00 AM      Result Value Ref Range   Sodium 133 (*) 137 - 147 mEq/L   Potassium 3.6 (*) 3.7 - 5.3 mEq/L   Chloride 100  96 - 112 mEq/L   CO2 21  19 - 32 mEq/L   Glucose, Bld 154 (*) 70 - 99 mg/dL   BUN 6  6 - 23 mg/dL   Creatinine, Ser 0.37 (*) 0.50 - 1.10 mg/dL   Calcium 8.5  8.4 - 10.5 mg/dL   GFR calc non Af Amer >90  >90 mL/min   GFR calc Af Amer >90  >90 mL/min  GLUCOSE, CAPILLARY     Status: Abnormal   Collection Time    07/07/13 12:00 AM      Result Value Ref Range   Glucose-Capillary 150 (*) 70 - 99 mg/dL  GLUCOSE, CAPILLARY     Status: Abnormal   Collection Time    07/07/13  1:03 AM      Result Value Ref Range   Glucose-Capillary 135 (*) 70 - 99 mg/dL  GLUCOSE, CAPILLARY     Status: None   Collection Time    07/07/13  2:05 AM      Result Value Ref Range   Glucose-Capillary 94  70 - 99 mg/dL   Comment 1 Documented in Chart     Comment 2 Notify RN    BASIC METABOLIC PANEL     Status: Abnormal   Collection Time     07/07/13  3:34 AM      Result Value Ref Range   Sodium 137  137 - 147 mEq/L   Potassium 3.8  3.7 - 5.3 mEq/L   Chloride 104  96 - 112 mEq/L   CO2 23  19 - 32 mEq/L   Glucose, Bld 112 (*) 70 - 99 mg/dL   BUN 5 (*) 6 - 23 mg/dL   Creatinine, Ser 0.41 (*) 0.50 - 1.10 mg/dL   Calcium 8.4  8.4 - 10.5 mg/dL   GFR calc non Af Amer >90  >90 mL/min   GFR calc Af Amer >90  >90 mL/min  GLUCOSE, CAPILLARY     Status: Abnormal   Collection Time    07/07/13  7:39 AM      Result Value Ref Range   Glucose-Capillary 276 (*) 70 - 99 mg/dL  BASIC METABOLIC  PANEL     Status: Abnormal   Collection Time    07/07/13  8:25 AM      Result Value Ref Range   Sodium 135 (*) 137 - 147 mEq/L   Potassium 4.3  3.7 - 5.3 mEq/L   Chloride 103  96 - 112 mEq/L   CO2 20  19 - 32 mEq/L   Glucose, Bld 287 (*) 70 - 99 mg/dL   BUN 4 (*) 6 - 23 mg/dL   Creatinine, Ser 0.41 (*) 0.50 - 1.10 mg/dL   Calcium 8.5  8.4 - 10.5 mg/dL   GFR calc non Af Amer >90  >90 mL/min   GFR calc Af Amer >90  >90 mL/min  GLUCOSE, CAPILLARY     Status: Abnormal   Collection Time    07/07/13 12:12 PM      Result Value Ref Range   Glucose-Capillary 233 (*) 70 - 99 mg/dL    Signed: Ivin Poot, MD 07/07/2013, 5:50 PM   Time Spent on Discharge: 40 minutes Services Ordered on Discharge: none Equipment Ordered on Discharge: none

## 2013-07-07 NOTE — Progress Notes (Signed)
Went to give patient provided Dr's note and patient had left the unit without telling anyone.  Unable to provide the note. Will place in the paper chart for medical records to collect.  Discharge instructions were provided prior to request of a dr's note  and 70/30 insulin was called into [facility] on [address] per  Patient request. PIV was removed and VSS stable at time when discharge instructions were being provided.

## 2013-07-07 NOTE — Progress Notes (Signed)
Internal Medicine Attending  Date: 07/07/2013  Patient name: Ashley Freeman Medical record number: [id number] Date of birth: 1991-02-15 Age: 23 y.o. Gender: female  I saw and evaluated the patient, and discussed her care on A.M rounds with housestaff.  I reviewed the resident's note by Dr. Stann Mainland and I agree with the resident's findings and plans as documented in her note.  Patient is unable to afford Lantus and NovoLog insulin, but has been able to afford 70/30 insulin.  There is some uncertainty regarding how much 70/30 insulin she has been taking at home; she initially reported 10 units twice a day, and today said that she had been taking 20 units twice a day.  Based on discussion with diabetes coordinator, the plan is to use 70/30 insulin 15 units twice a day and advise close monitoring of  blood sugars and not skipping meals.  Patient has follow-up appointment scheduled in 2 days in our clinic, and we emphasized the importance of close follow-up.

## 2013-07-09 ENCOUNTER — Ambulatory Visit: Payer: Self-pay | Admitting: Dietician

## 2013-07-09 ENCOUNTER — Encounter: Payer: Medicaid Other | Admitting: Internal Medicine

## 2013-08-07 ENCOUNTER — Encounter (HOSPITAL_COMMUNITY): Payer: Self-pay | Admitting: Emergency Medicine

## 2013-08-07 ENCOUNTER — Inpatient Hospital Stay (HOSPITAL_COMMUNITY)
Admission: EM | Admit: 2013-08-07 | Discharge: 2013-08-11 | DRG: 638 | Disposition: A | Discharge status: Home / Self Care | Payer: Medicaid Other | Attending: Internal Medicine | Admitting: Internal Medicine

## 2013-08-07 ENCOUNTER — Observation Stay (HOSPITAL_COMMUNITY): Payer: Medicaid Other

## 2013-08-07 DIAGNOSIS — Z9119 Patient's noncompliance with other medical treatment and regimen: Secondary | ICD-10-CM

## 2013-08-07 DIAGNOSIS — E1010 Type 1 diabetes mellitus with ketoacidosis without coma: Principal | ICD-10-CM | POA: Diagnosis present

## 2013-08-07 DIAGNOSIS — I472 Ventricular tachycardia, unspecified: Secondary | ICD-10-CM | POA: Diagnosis not present

## 2013-08-07 DIAGNOSIS — Z794 Long term (current) use of insulin: Secondary | ICD-10-CM

## 2013-08-07 DIAGNOSIS — E875 Hyperkalemia: Secondary | ICD-10-CM

## 2013-08-07 DIAGNOSIS — E1065 Type 1 diabetes mellitus with hyperglycemia: Secondary | ICD-10-CM | POA: Diagnosis present

## 2013-08-07 DIAGNOSIS — E876 Hypokalemia: Secondary | ICD-10-CM

## 2013-08-07 DIAGNOSIS — D72829 Elevated white blood cell count, unspecified: Secondary | ICD-10-CM | POA: Diagnosis present

## 2013-08-07 DIAGNOSIS — E109 Type 1 diabetes mellitus without complications: Secondary | ICD-10-CM

## 2013-08-07 DIAGNOSIS — K219 Gastro-esophageal reflux disease without esophagitis: Secondary | ICD-10-CM | POA: Diagnosis present

## 2013-08-07 DIAGNOSIS — Z91199 Patient's noncompliance with other medical treatment and regimen due to unspecified reason: Secondary | ICD-10-CM

## 2013-08-07 DIAGNOSIS — Z880 Allergy status to penicillin: Secondary | ICD-10-CM

## 2013-08-07 DIAGNOSIS — Z833 Family history of diabetes mellitus: Secondary | ICD-10-CM

## 2013-08-07 DIAGNOSIS — E1110 Type 2 diabetes mellitus with ketoacidosis without coma: Secondary | ICD-10-CM | POA: Diagnosis present

## 2013-08-07 DIAGNOSIS — R739 Hyperglycemia, unspecified: Secondary | ICD-10-CM | POA: Diagnosis present

## 2013-08-07 DIAGNOSIS — E108 Type 1 diabetes mellitus with unspecified complications: Secondary | ICD-10-CM

## 2013-08-07 DIAGNOSIS — I4729 Other ventricular tachycardia: Secondary | ICD-10-CM | POA: Diagnosis not present

## 2013-08-07 LAB — GLUCOSE, CAPILLARY
GLUCOSE-CAPILLARY: 580 mg/dL — AB (ref 70–99)
Glucose-Capillary: 504 mg/dL — ABNORMAL HIGH (ref 70–99)
Glucose-Capillary: 368 mg/dL — ABNORMAL HIGH (ref 70–99)
Glucose-Capillary: 253 mg/dL — ABNORMAL HIGH (ref 70–99)

## 2013-08-07 LAB — BASIC METABOLIC PANEL
BUN: 9 mg/dL (ref 6–23)
BUN: 9 mg/dL (ref 6–23)
BUN: 8 mg/dL (ref 6–23)
CHLORIDE: 103 meq/L (ref 96–112)
CHLORIDE: 105 meq/L (ref 96–112)
CO2: 7 meq/L — AB (ref 19–32)
Calcium: 9.8 mg/dL (ref 8.4–10.5)
Calcium: 9.6 mg/dL (ref 8.4–10.5)
Calcium: 9.5 mg/dL (ref 8.4–10.5)
Chloride: 89 mEq/L — ABNORMAL LOW (ref 96–112)
Creatinine, Ser: 0.61 mg/dL (ref 0.50–1.10)
Creatinine, Ser: 0.56 mg/dL (ref 0.50–1.10)
Creatinine, Ser: 0.61 mg/dL (ref 0.50–1.10)
GFR calc Af Amer: >90 mL/min (ref >90)
GFR calc Af Amer: >90 mL/min (ref >90)
GFR calc Af Amer: >90 mL/min (ref >90)
GFR calc non Af Amer: >90 mL/min (ref >90)
GFR calc non Af Amer: >90 mL/min (ref >90)
GLUCOSE: 242 mg/dL — AB (ref 70–99)
Glucose, Bld: 687 mg/dL (ref 70–99)
Glucose, Bld: 408 mg/dL — ABNORMAL HIGH (ref 70–99)
POTASSIUM: 5.5 meq/L — AB (ref 3.7–5.3)
POTASSIUM: 4.5 meq/L (ref 3.7–5.3)
Potassium: 5.4 mEq/L — ABNORMAL HIGH (ref 3.7–5.3)
SODIUM: 140 meq/L (ref 137–147)
Sodium: 131 mEq/L — ABNORMAL LOW (ref 137–147)
Sodium: 143 mEq/L (ref 137–147)

## 2013-08-07 LAB — URINALYSIS, ROUTINE W REFLEX MICROSCOPIC
Bilirubin Urine: NEGATIVE
Glucose, UA: >1000 mg/dL — AB
Hgb urine dipstick: NEGATIVE
Ketones, ur: >80 mg/dL — AB
LEUKOCYTES UA: NEGATIVE
Nitrite: NEGATIVE
PROTEIN: NEGATIVE mg/dL
SPECIFIC GRAVITY, URINE: 1.034 — AB (ref 1.005–1.030)
UROBILINOGEN UA: 0.2 mg/dL (ref 0.0–1.0)
pH: 5 (ref 5.0–8.0)

## 2013-08-07 LAB — CBC
HCT: 41.4 % (ref 36.0–46.0)
HEMATOCRIT: 44.4 % (ref 36.0–46.0)
HEMOGLOBIN: 15.8 g/dL — AB (ref 12.0–15.0)
Hemoglobin: 14.4 g/dL (ref 12.0–15.0)
MCH: 29.7 pg (ref 26.0–34.0)
MCH: 29 pg (ref 26.0–34.0)
MCHC: 35.6 g/dL (ref 30.0–36.0)
MCHC: 34.8 g/dL (ref 30.0–36.0)
MCV: 83.5 fL (ref 78.0–100.0)
MCV: 83.5 fL (ref 78.0–100.0)
Platelets: 369 10*3/uL (ref 150–400)
Platelets: 397 10*3/uL (ref 150–400)
RBC: 5.32 MIL/uL — ABNORMAL HIGH (ref 3.87–5.11)
RBC: 4.96 MIL/uL (ref 3.87–5.11)
RDW: 13.4 % (ref 11.5–15.5)
RDW: 13.6 % (ref 11.5–15.5)
WBC: 15.7 10*3/uL — AB (ref 4.0–10.5)
WBC: 25.3 10*3/uL — ABNORMAL HIGH (ref 4.0–10.5)

## 2013-08-07 LAB — I-STAT VENOUS BLOOD GAS, ED
Acid-base deficit: 15 mmol/L — ABNORMAL HIGH (ref 0.0–2.0)
Bicarbonate: 10.6 mEq/L — ABNORMAL LOW (ref 20.0–24.0)
O2 Saturation: 39 %
PCO2 VEN: 26.7 mmHg — AB (ref 45.0–50.0)
TCO2: 11 mmol/L (ref 0–100)
pH, Ven: 7.206 — ABNORMAL LOW (ref 7.250–7.300)
pO2, Ven: 27 mmHg — CL (ref 30.0–45.0)

## 2013-08-07 LAB — URINE MICROSCOPIC-ADD ON

## 2013-08-07 LAB — COMPREHENSIVE METABOLIC PANEL
ALT: 12 U/L (ref 0–35)
AST: 19 U/L (ref 0–37)
Albumin: 4.5 g/dL (ref 3.5–5.2)
Alkaline Phosphatase: 111 U/L (ref 39–117)
BILIRUBIN TOTAL: 0.2 mg/dL — AB (ref 0.3–1.2)
BUN: 9 mg/dL (ref 6–23)
CHLORIDE: 84 meq/L — AB (ref 96–112)
CO2: 8 mEq/L — CL (ref 19–32)
Calcium: 10.2 mg/dL (ref 8.4–10.5)
Creatinine, Ser: 0.64 mg/dL (ref 0.50–1.10)
GLUCOSE: 773 mg/dL — AB (ref 70–99)
Potassium: 4.8 mEq/L (ref 3.7–5.3)
Sodium: 128 mEq/L — ABNORMAL LOW (ref 137–147)
Total Protein: 9.2 g/dL — ABNORMAL HIGH (ref 6.0–8.3)

## 2013-08-07 LAB — CBG MONITORING, ED: Glucose-Capillary: >600 mg/dL (ref 70–99)

## 2013-08-07 LAB — MRSA PCR SCREENING: MRSA by PCR: NEGATIVE

## 2013-08-07 LAB — TROPONIN I

## 2013-08-07 LAB — POTASSIUM: POTASSIUM: 5.5 meq/L — AB (ref 3.7–5.3)

## 2013-08-07 LAB — POC URINE PREG, ED: PREG TEST UR: NEGATIVE

## 2013-08-07 LAB — I-STAT CG4 LACTIC ACID, ED: Lactic Acid, Venous: 1.88 mmol/L (ref 0.5–2.2)

## 2013-08-07 MED ORDER — SODIUM CHLORIDE 0.9 % IV SOLN
INTRAVENOUS | Status: DC
  Administered 2013-08-07: 21:00:00 via INTRAVENOUS
  Filled 2013-08-07 (×6): qty 1000

## 2013-08-07 MED ORDER — ONDANSETRON HCL 4 MG/2ML IJ SOLN: 4.0000 mg | Freq: Once | INTRAMUSCULAR | Status: DC

## 2013-08-07 MED ORDER — SODIUM CHLORIDE 0.9 % IV SOLN: INTRAVENOUS | Status: AC

## 2013-08-07 MED ORDER — SODIUM CHLORIDE 0.9 % IV BOLUS (SEPSIS)
1000.0000 mL | Freq: Once | INTRAVENOUS | Status: AC
  Administered 2013-08-07: 1000 mL via INTRAVENOUS

## 2013-08-07 MED ORDER — PANTOPRAZOLE SODIUM 40 MG PO TBEC
40.0000 mg | DELAYED_RELEASE_TABLET | Freq: Once | ORAL | Status: DC
Start: 2013-08-07 — End: 2013-08-07

## 2013-08-07 MED ORDER — SODIUM CHLORIDE 0.9 % IV SOLN
INTRAVENOUS | Status: DC
  Administered 2013-08-08: 4.5 [IU]/h via INTRAVENOUS
  Administered 2013-08-08: via INTRAVENOUS
  Administered 2013-08-09: 1.6 [IU]/h via INTRAVENOUS
  Administered 2013-08-09: 5.7 [IU]/h via INTRAVENOUS
  Administered 2013-08-09: 4.2 [IU]/h via INTRAVENOUS
  Administered 2013-08-09: 2.8 [IU]/h via INTRAVENOUS
  Administered 2013-08-09: 4 [IU]/h via INTRAVENOUS
  Administered 2013-08-09: 9 [IU]/h via INTRAVENOUS
  Administered 2013-08-10: 5.2 [IU]/h via INTRAVENOUS
  Administered 2013-08-10: 3.6 [IU]/h via INTRAVENOUS
  Administered 2013-08-10: 1.9 [IU]/h via INTRAVENOUS
  Filled 2013-08-07 (×2): qty 1

## 2013-08-07 MED ORDER — DEXTROSE 50 % IV SOLN: 25.0000 mL | INTRAVENOUS | Status: DC | PRN

## 2013-08-07 MED ORDER — SODIUM BICARBONATE 8.4 % IV SOLN: 50.0000 meq | Freq: Once | INTRAVENOUS | Status: DC

## 2013-08-07 MED ORDER — SODIUM CHLORIDE 0.9 % IV SOLN: 1000.0000 mL | INTRAVENOUS | Status: DC

## 2013-08-07 MED ORDER — SODIUM CHLORIDE 0.9 % IV SOLN: INTRAVENOUS | Status: DC

## 2013-08-07 MED ORDER — INSULIN REGULAR HUMAN 100 UNIT/ML IJ SOLN
INTRAMUSCULAR | Status: DC
  Filled 2013-08-07: qty 1

## 2013-08-07 MED ORDER — HEPARIN SODIUM (PORCINE) 5000 UNIT/ML IJ SOLN
5000.0000 [IU] | Freq: Three times a day (TID) | INTRAMUSCULAR | Status: DC
  Administered 2013-08-08 – 2013-08-11 (×10): 5000 [IU] via SUBCUTANEOUS
  Filled 2013-08-07 (×14): qty 1

## 2013-08-07 MED ORDER — DEXTROSE-NACL 5-0.45 % IV SOLN: INTRAVENOUS | Status: DC

## 2013-08-07 MED ORDER — DEXTROSE-NACL 5-0.45 % IV SOLN
INTRAVENOUS | Status: DC
  Administered 2013-08-07 – 2013-08-08 (×2): via INTRAVENOUS

## 2013-08-07 NOTE — H&P (Signed)
Date: 08/07/2013               Patient Name:  Ashley Freeman MRN: [id number]  DOB: [birth year] Age / Sex: 23 y.o., female   PCP: Annett Gula, MD         Medical Service: Internal Medicine Teaching Service         Attending Physician: Dr. Farley Ly, MD    First Contact: Dr. Vivi Barrack Pager: [phone]  Second Contact: Dr. Charlsie Merles Pager: [phone]       After Hours (After 5p/  First Contact Pager: [phone]  weekends / holidays): Second Contact Pager: [phone]   Chief Complaint: Fatigue  History of Present Illness:  Ashley Freeman is a 23 year-old woman with uncontrolled DM1 (diagnosed at age 5; A1c 17 in 04/2013) who presents with shortness of breath, nausea, and malaise.   She reports that 1 day prior to admission she began feeling short of breath and increasingly thirsty and fatigued. She states that her outpatient insulin regimen is currently Novolin 70/30 15 units BID and she's been taking it as scheduled, but she "doesn't feel like it's working anymore" and is causing her symptoms. She presented in January with a similar presentation due to not being able to afford her medication. She denies AMS, fevers/chills, N/V, chest pain, abdominal pain, or leg swelling.   In the ED labs were consistent with DKA; Glucose - 687, HCO3 - <7, anion gap of 35, venous pH 7.20, lactic acid within normal limits, UA positive for glucosuria and ketonuria. She was started on IV hydration, insulin, and provided zofran once for nausea.    Meds: Current Facility-Administered Medications  Medication Dose Route Frequency Provider Last Rate Last Dose  . 0.9 %  sodium chloride infusion  1,000 mL Intravenous Continuous Shanna Cisco, MD      . dextrose 5 %-0.45 % sodium chloride infusion   Intravenous Continuous Shanna Cisco, MD      . insulin regular (NOVOLIN R,HUMULIN R) 1 Units/mL in sodium chloride 0.9 % 100 mL infusion   Intravenous Continuous Shanna Cisco, MD      .  ondansetron (ZOFRAN) injection 4 mg  4 mg Intravenous Once Shanna Cisco, MD      . pantoprazole (PROTONIX) EC tablet 40 mg  40 mg Oral Once Shanna Cisco, MD      . sodium bicarbonate injection 50 mEq  50 mEq Intravenous Once Genelle Gather, MD        Allergies: Allergies as of 08/07/2013 - Review Complete 08/07/2013  Allergen Reaction Noted  . Penicillins Hives 11/11/2010   Past Medical History  Diagnosis Date  . Gonorrhea 08/2011    Treated in 09/2011  . Diabetes mellitus 2001    Diagnosed at age 65 ; Type I   Past Surgical History  Procedure Laterality Date  . No past surgeries     Family History  Problem Relation Age of Onset  . Anesthesia problems Neg Hx   . Asthma Mother   . Gout Father   . Diabetes Paternal Grandmother    History   Social History  . Marital Status: Single    Spouse Name: N/A    Number of Children: 0  . Years of Education: 11th grade   Occupational History  . unemployed     has never worked   Social History Main Topics  . Smoking status: Never Smoker   . Smokeless tobacco: Never Used  .  Alcohol Use: No  . Drug Use: No  . Sexual Activity: Yes    Birth Control/ Protection: None     Comment: As of 11/06/11 1 partner (female) not using protection   Other Topics Concern  . Not on file   Social History Narrative   Patient lives in [place] mother lives in [place].  Unemployed.  Previously worked for a Customer service manager.  Completed 11 grade working on [employer]. Patient 3 brothers     Review of Systems: Pertinent items are noted in HPI.  Physical Exam: Blood pressure 127/86, pulse 109, temperature 98.2 F (36.8 C), temperature source Oral, resp. rate 25, SpO2 100.00%. Physical Exam  Constitutional: She is oriented to person, place, and time. Vital signs are normal.  In mild distress  HENT:  Head: Normocephalic and atraumatic.  Eyes: Conjunctivae and EOM are normal. Pupils are equal, round, and reactive to light.  Neck: Normal range  of motion. Neck supple.  Cardiovascular: Regular rhythm, normal heart sounds and intact distal pulses.  Tachycardia present.  Exam reveals no gallop and no friction rub.   No murmur heard. Pulmonary/Chest: Breath sounds normal. She has no wheezes. She has no rales. She exhibits no tenderness.  Tachypnea to 25, she does endorse shortness of breath when moving from lying to sitting in bed  Abdominal: Soft. Bowel sounds are normal. She exhibits no distension and no mass. There is no tenderness. There is no rebound and no guarding.  Musculoskeletal: Normal range of motion. She exhibits no edema and no tenderness.  Neurological: She is alert and oriented to person, place, and time. No cranial nerve deficit. GCS score is 15.  Skin: Skin is warm and dry. No rash noted. No erythema. No pallor.  Psychiatric: Mood and affect normal.    Lab results: Basic Metabolic Panel:  Recent Labs  62/13/08 1255 08/07/13 1538  NA 128* 131*  K 4.8 5.5*  5.5*  CL 84* 89*  CO2 8* <7*  GLUCOSE 773* 687*  BUN 9 9  CREATININE 0.64 0.61  CALCIUM 10.2 9.8   Liver Function Tests:  Recent Labs  08/07/13 1255  AST 19  ALT 12  ALKPHOS 111  BILITOT 0.2*  PROT 9.2*  ALBUMIN 4.5   CBC:  Recent Labs  08/07/13 1255  WBC 15.7*  HGB 15.8*  HCT 44.4  MCV 83.5  PLT 369   CBG:  Recent Labs  08/07/13 1253 08/07/13 1516  GLUCAP >600* >600*   Urine Drug Screen: Drugs of Abuse     Component Value Date/Time   LABOPIA NONE DETECTED 09/27/2012 1228   LABOPIA NEGATIVE 12/18/2009 1453   COCAINSCRNUR NONE DETECTED 09/27/2012 1228   COCAINSCRNUR NEGATIVE 12/18/2009 1453   LABBENZ NONE DETECTED 09/27/2012 1228   LABBENZ NEGATIVE 12/18/2009 1453   AMPHETMU NONE DETECTED 09/27/2012 1228   AMPHETMU NEGATIVE 12/18/2009 1453   THCU NONE DETECTED 09/27/2012 1228   LABBARB NONE DETECTED 09/27/2012 1228    Urinalysis:  Recent Labs  08/07/13 1518  COLORURINE YELLOW  LABSPEC 1.034*  PHURINE 5.0  GLUCOSEU >1000*   HGBUR NEGATIVE  BILIRUBINUR NEGATIVE  KETONESUR >80*  PROTEINUR NEGATIVE  UROBILINOGEN 0.2  NITRITE NEGATIVE  LEUKOCYTESUR NEGATIVE    Other results: EKG: Pending.  Assessment & Plan by Problem: Ashley Freeman is a 23 year old with uncontrolled DM1 (A1C 17% in 04/2013) presenting in DKA.   #DKA - Multiple admissions for DKA in the past. On admission, glucose 773, HCO3 8, AG 36, >80 ketonuria. ABG below  shows anion gap metabolic acidosis. She denies infectious symptoms including cough, fever, chills, dysuria, abdominal pain, vomiting, diarrhea. Only complaint is heartburn. She does have a leukocytosis to 15.7. She is afebrile, but tachycardic to the 110s. UA is unremarkable. Lactate wnl. Pregnancy test negative. She claims she has been compliant with her insulin, but prior episodes of DKA (3/8 - 3/10 and 1/17-1/20) were triggered by running out of her insulin. We will do an infectious work up. - Admit to IMTS step down on telemetry  - DKA protocol (continue insulin gtt, IVFs, NPO)  - IVF NS @125cc /hr to be converted to D5-0.5NS once BG < 250  - BMETs q2h until AG closed x 2  - Monitor K and replete as necessary  - Giving 1 amp of bicarbonate IV - Start [place] insulin once AG closed x 2  - Continue insulin drip for 2 hours after [place] insulin started  - EKG now - Trend troponins - Chest x-ray  - Blood cultures x2 - UDS - Hemoglobin A1C - Consult to social work, diabetes educator   ABG    Component Value Date/Time   PHART 6.993* 10/26/2012 0809   PCO2ART 8.0* 10/26/2012 0809   PO2ART 137.0* 10/26/2012 0809   HCO3 10.6* 08/07/2013 1354   TCO2 11 08/07/2013 1354   ACIDBASEDEF 15.0* 08/07/2013 1354   O2SAT 39.0 08/07/2013 1354   #GERD - PPI  #DVT PPX- Heparin subq   Dispo: Disposition is deferred at this time, awaiting improvement of current medical problems. Anticipated discharge in approximately 1-3 day(s).   The patient does have a current PCP Annett Gula, MD) and does  need an [facility] hospital follow-up appointment after discharge.  The patient does not have transportation limitations that hinder transportation to clinic appointments.  Signed: Vivi Barrack, MD 08/07/2013, 5:58 PM   Vivi Barrack, MD  [email or web address] Pager # [phone] After hours and weekends # [phone] Office # [phone]

## 2013-08-07 NOTE — ED Notes (Addendum)
Pt c/o hyperglycemia.  Pt reports feeling thirsty and fatigued.  Denies other symptoms.  Pt states she uses insulin regularly with a sliding scale.  Pt reports loss of appetite over the past few weeks.  Pt also reports she has difficulty falling and staying asleep and requested medication for this.  No acute distress, respirations equal and unlabored, skin warm and dry.

## 2013-08-07 NOTE — ED Provider Notes (Addendum)
CSN: [id number]     Arrival date & time 08/07/13  1236 History   First MD Initiated Contact with Patient 08/07/13 1326     Chief Complaint  Patient presents with  . Blood Sugar Problem     (Consider location/radiation/quality/duration/timing/severity/associated sxs/prior Treatment) Patient is a 23 y.o. female presenting with hyperglycemia. The history is provided by the patient. No language interpreter was used.  Hyperglycemia Blood sugar level PTA:  >600 Severity:  Severe Onset quality:  Gradual Duration:  3 days Timing:  Constant Progression:  Worsening Chronicity:  Recurrent Diabetes status:  Controlled with insulin Current diabetic therapy:  Novolin 70/30 Context: not change in medication, not insulin pump use, not new diabetes diagnosis, not noncompliance, not recent change in diet and not recent illness   Relieved by:  Nothing Ineffective treatments:  Insulin Associated symptoms: fatigue, increased thirst, polyuria and shortness of breath   Associated symptoms: no abdominal pain, no altered mental status, no blurred vision, no chest pain, no confusion, no dehydration, no diaphoresis, no dysuria, no fever, no nausea and no vomiting   Fatigue:    Severity:  Moderate   Duration:  3 days   Timing:  Constant   Progression:  Worsening Shortness of breath:    Severity:  Mild   Onset quality:  Unable to specify   Duration:  3 days   Timing:  Unable to specify   Progression:  Unable to specify Risk factors: hx of DKA     Past Medical History  Diagnosis Date  . Gonorrhea 08/2011    Treated in 09/2011  . Diabetes mellitus 2001    Diagnosed at age 21 ; Type I   Past Surgical History  Procedure Laterality Date  . No past surgeries     Family History  Problem Relation Age of Onset  . Anesthesia problems Neg Hx   . Asthma Mother   . Gout Father   . Diabetes Paternal Grandmother    History  Substance Use Topics  . Smoking status: Never Smoker   . Smokeless tobacco:  Never Used  . Alcohol Use: No   OB History   Grav Para Term Preterm Abortions TAB SAB Ect Mult Living   1 0   1  1   0     Review of Systems  Constitutional: Positive for fatigue. Negative for fever, chills, diaphoresis, activity change and appetite change.  HENT: Negative for congestion, facial swelling, rhinorrhea and sore throat.   Eyes: Negative for blurred vision, photophobia and discharge.  Respiratory: Positive for shortness of breath. Negative for cough and chest tightness.   Cardiovascular: Negative for chest pain, palpitations and leg swelling.  Gastrointestinal: Negative for nausea, vomiting, abdominal pain and diarrhea.  Endocrine: Positive for polydipsia and polyuria.  Genitourinary: Negative for dysuria, frequency, difficulty urinating and pelvic pain.  Musculoskeletal: Negative for arthralgias, back pain, neck pain and neck stiffness.  Skin: Negative for color change and wound.  Allergic/Immunologic: Negative for immunocompromised state.  Neurological: Negative for facial asymmetry, weakness, numbness and headaches.  Hematological: Does not bruise/bleed easily.  Psychiatric/Behavioral: Negative for confusion and agitation.      Allergies  Penicillins  Home Medications   No current outpatient prescriptions on file. BP 108/71  Pulse 86  Temp(Src) 98 F (36.7 C) (Oral)  Resp 17  SpO2 100%  LMP 07/17/2013 Physical Exam  Constitutional: She is oriented to person, place, and time. She appears well-developed and well-nourished. No distress.  HENT:  Head: Normocephalic and atraumatic.  Mouth/Throat: Mucous membranes are dry. No oropharyngeal exudate.  Eyes: Pupils are equal, round, and reactive to light.  Neck: Normal range of motion. Neck supple.  Cardiovascular: Regular rhythm and normal heart sounds.  Tachycardia present.  Exam reveals no gallop and no friction rub.   No murmur heard. Pulmonary/Chest: Breath sounds normal. Tachypnea noted. No respiratory  distress. She has no wheezes. She has no rales.  Abdominal: Soft. Bowel sounds are normal. She exhibits no distension and no mass. There is no tenderness. There is no rebound and no guarding.  Musculoskeletal: Normal range of motion. She exhibits no edema and no tenderness.  Neurological: She is alert and oriented to person, place, and time.  Skin: Skin is warm and dry.  Psychiatric: She has a normal mood and affect.    ED Course  Procedures (including critical care time) Labs Review Labs Reviewed  CBC - Abnormal; Notable for the following:    WBC 15.7 (*)    RBC 5.32 (*)    Hemoglobin 15.8 (*)    All other components within normal limits  COMPREHENSIVE METABOLIC PANEL - Abnormal; Notable for the following:    Sodium 128 (*)    Chloride 84 (*)    CO2 8 (*)    Glucose, Bld 773 (*)    Total Protein 9.2 (*)    Total Bilirubin 0.2 (*)    All other components within normal limits  URINALYSIS, ROUTINE W REFLEX MICROSCOPIC - Abnormal; Notable for the following:    Specific Gravity, Urine 1.034 (*)    Glucose, UA >1000 (*)    Ketones, ur >80 (*)    All other components within normal limits  POTASSIUM - Abnormal; Notable for the following:    Potassium 5.5 (*)    All other components within normal limits  BASIC METABOLIC PANEL - Abnormal; Notable for the following:    Sodium 131 (*)    Potassium 5.5 (*)    Chloride 89 (*)    CO2 <7 (*)    Glucose, Bld 687 (*)    All other components within normal limits  URINE MICROSCOPIC-ADD ON - Abnormal; Notable for the following:    Squamous Epithelial / LPF FEW (*)    All other components within normal limits  BASIC METABOLIC PANEL - Abnormal; Notable for the following:    Potassium 5.4 (*)    CO2 <7 (*)    Glucose, Bld 408 (*)    All other components within normal limits  BASIC METABOLIC PANEL - Abnormal; Notable for the following:    CO2 7 (*)    Glucose, Bld 242 (*)    All other components within normal limits  BASIC METABOLIC PANEL  - Abnormal; Notable for the following:    CO2 10 (*)    Glucose, Bld 180 (*)    Creatinine, Ser 0.47 (*)    All other components within normal limits  CBC - Abnormal; Notable for the following:    WBC 25.3 (*)    All other components within normal limits  HEMOGLOBIN A1C - Abnormal; Notable for the following:    Hemoglobin A1C 19.3 (*)    Mean Plasma Glucose 507 (*)    All other components within normal limits  GLUCOSE, CAPILLARY - Abnormal; Notable for the following:    Glucose-Capillary 580 (*)    All other components within normal limits  GLUCOSE, CAPILLARY - Abnormal; Notable for the following:    Glucose-Capillary 504 (*)    All other components within normal  limits  GLUCOSE, CAPILLARY - Abnormal; Notable for the following:    Glucose-Capillary 368 (*)    All other components within normal limits  PHOSPHORUS - Abnormal; Notable for the following:    Phosphorus 1.1 (*)    All other components within normal limits  GLUCOSE, CAPILLARY - Abnormal; Notable for the following:    Glucose-Capillary 253 (*)    All other components within normal limits  GLUCOSE, CAPILLARY - Abnormal; Notable for the following:    Glucose-Capillary 264 (*)    All other components within normal limits  GLUCOSE, CAPILLARY - Abnormal; Notable for the following:    Glucose-Capillary 192 (*)    All other components within normal limits  GLUCOSE, CAPILLARY - Abnormal; Notable for the following:    Glucose-Capillary 167 (*)    All other components within normal limits  GLUCOSE, CAPILLARY - Abnormal; Notable for the following:    Glucose-Capillary 161 (*)    All other components within normal limits  GLUCOSE, CAPILLARY - Abnormal; Notable for the following:    Glucose-Capillary 105 (*)    All other components within normal limits  BASIC METABOLIC PANEL - Abnormal; Notable for the following:    Potassium 3.6 (*)    CO2 13 (*)    Glucose, Bld 108 (*)    Creatinine, Ser 0.49 (*)    All other components  within normal limits  BASIC METABOLIC PANEL - Abnormal; Notable for the following:    Potassium 3.1 (*)    CO2 13 (*)    Glucose, Bld 202 (*)    Calcium 8.1 (*)    All other components within normal limits  BASIC METABOLIC PANEL - Abnormal; Notable for the following:    Potassium 3.4 (*)    CO2 14 (*)    Glucose, Bld 102 (*)    Creatinine, Ser 0.49 (*)    Calcium 8.2 (*)    All other components within normal limits  BASIC METABOLIC PANEL - Abnormal; Notable for the following:    Sodium 134 (*)    CO2 12 (*)    Glucose, Bld 242 (*)    BUN 4 (*)    Creatinine, Ser 0.40 (*)    All other components within normal limits  BASIC METABOLIC PANEL - Abnormal; Notable for the following:    Sodium 136 (*)    Potassium 3.3 (*)    CO2 13 (*)    Glucose, Bld 235 (*)    BUN 4 (*)    Creatinine, Ser 0.42 (*)    All other components within normal limits  GLUCOSE, CAPILLARY - Abnormal; Notable for the following:    Glucose-Capillary 206 (*)    All other components within normal limits  GLUCOSE, CAPILLARY - Abnormal; Notable for the following:    Glucose-Capillary 230 (*)    All other components within normal limits  GLUCOSE, CAPILLARY - Abnormal; Notable for the following:    Glucose-Capillary 108 (*)    All other components within normal limits  GLUCOSE, CAPILLARY - Abnormal; Notable for the following:    Glucose-Capillary 151 (*)    All other components within normal limits  CBC - Abnormal; Notable for the following:    WBC 13.4 (*)    All other components within normal limits  GLUCOSE, CAPILLARY - Abnormal; Notable for the following:    Glucose-Capillary 114 (*)    All other components within normal limits  GLUCOSE, CAPILLARY - Abnormal; Notable for the following:    Glucose-Capillary 145 (*)  All other components within normal limits  GLUCOSE, CAPILLARY - Abnormal; Notable for the following:    Glucose-Capillary 257 (*)    All other components within normal limits  GLUCOSE,  CAPILLARY - Abnormal; Notable for the following:    Glucose-Capillary 121 (*)    All other components within normal limits  GLUCOSE, CAPILLARY - Abnormal; Notable for the following:    Glucose-Capillary 140 (*)    All other components within normal limits  GLUCOSE, CAPILLARY - Abnormal; Notable for the following:    Glucose-Capillary 271 (*)    All other components within normal limits  GLUCOSE, CAPILLARY - Abnormal; Notable for the following:    Glucose-Capillary 315 (*)    All other components within normal limits  GLUCOSE, CAPILLARY - Abnormal; Notable for the following:    Glucose-Capillary 277 (*)    All other components within normal limits  GLUCOSE, CAPILLARY - Abnormal; Notable for the following:    Glucose-Capillary 213 (*)    All other components within normal limits  GLUCOSE, CAPILLARY - Abnormal; Notable for the following:    Glucose-Capillary 192 (*)    All other components within normal limits  GLUCOSE, CAPILLARY - Abnormal; Notable for the following:    Glucose-Capillary 159 (*)    All other components within normal limits  GLUCOSE, CAPILLARY - Abnormal; Notable for the following:    Glucose-Capillary 118 (*)    All other components within normal limits  BASIC METABOLIC PANEL - Abnormal; Notable for the following:    Sodium 136 (*)    Potassium 2.9 (*)    CO2 16 (*)    Glucose, Bld 112 (*)    BUN 4 (*)    Creatinine, Ser 0.43 (*)    All other components within normal limits  BASIC METABOLIC PANEL - Abnormal; Notable for the following:    Sodium 135 (*)    Potassium 3.0 (*)    CO2 13 (*)    Glucose, Bld 172 (*)    BUN 3 (*)    Creatinine, Ser 0.42 (*)    All other components within normal limits  BASIC METABOLIC PANEL - Abnormal; Notable for the following:    Sodium 134 (*)    CO2 15 (*)    Glucose, Bld 213 (*)    BUN 3 (*)    Creatinine, Ser 0.38 (*)    Calcium 7.8 (*)    All other components within normal limits  BASIC METABOLIC PANEL - Abnormal;  Notable for the following:    Sodium 136 (*)    Potassium 3.4 (*)    CO2 15 (*)    Glucose, Bld 155 (*)    BUN <3 (*)    Creatinine, Ser 0.42 (*)    Calcium 8.3 (*)    All other components within normal limits  GLUCOSE, CAPILLARY - Abnormal; Notable for the following:    Glucose-Capillary 113 (*)    All other components within normal limits  GLUCOSE, CAPILLARY - Abnormal; Notable for the following:    Glucose-Capillary 133 (*)    All other components within normal limits  GLUCOSE, CAPILLARY - Abnormal; Notable for the following:    Glucose-Capillary 166 (*)    All other components within normal limits  GLUCOSE, CAPILLARY - Abnormal; Notable for the following:    Glucose-Capillary 175 (*)    All other components within normal limits  GLUCOSE, CAPILLARY - Abnormal; Notable for the following:    Glucose-Capillary 192 (*)    All other components within  normal limits  GLUCOSE, CAPILLARY - Abnormal; Notable for the following:    Glucose-Capillary 174 (*)    All other components within normal limits  GLUCOSE, CAPILLARY - Abnormal; Notable for the following:    Glucose-Capillary 171 (*)    All other components within normal limits  CBG MONITORING, ED - Abnormal; Notable for the following:    Glucose-Capillary >600 (*)    All other components within normal limits  I-STAT VENOUS BLOOD GAS, ED - Abnormal; Notable for the following:    pH, Ven 7.206 (*)    pCO2, Ven 26.7 (*)    pO2, Ven 27.0 (*)    Bicarbonate 10.6 (*)    Acid-base deficit 15.0 (*)    All other components within normal limits  CBG MONITORING, ED - Abnormal; Notable for the following:    Glucose-Capillary >600 (*)    All other components within normal limits  URINE CULTURE  MRSA PCR SCREENING  CULTURE, BLOOD (ROUTINE X 2)  CULTURE, BLOOD (ROUTINE X 2)  TROPONIN I  TROPONIN I  TROPONIN I  URINE RAPID DRUG SCREEN (HOSP PERFORMED)  GLUCOSE, CAPILLARY  GLUCOSE, CAPILLARY  GLUCOSE, CAPILLARY  BASIC METABOLIC PANEL   POC URINE PREG, ED  I-STAT CG4 LACTIC ACID, ED   Imaging Review Dg Chest Port 1 View  08/07/2013   CLINICAL DATA:  Shortness of breath, respiratory difficulty.  EXAM: PORTABLE CHEST - 1 VIEW  COMPARISON:  DG CHEST 1V PORT dated 10/26/2012  FINDINGS: The heart size and mediastinal contours are within normal limits. Both lungs are clear. The visualized skeletal structures are unremarkable. Gas distended stomach.  IMPRESSION: No acute cardiopulmonary process.   Electronically Signed   By: Elon Alas   On: 08/07/2013 20:54     EKG Interpretation None      CRITICAL CARE Performed by: Neta Ehlers Total critical care time: 30 Critical care time was exclusive of separately billable procedures and treating other patients. Critical care was necessary to treat or prevent imminent or life-threatening deterioration. Critical care was time spent personally by me on the following activities: development of treatment plan with patient and/or surrogate as well as nursing, discussions with consultants, evaluation of patient's response to treatment, examination of patient, obtaining history from patient or surrogate, ordering and performing treatments and interventions, ordering and review of laboratory studies, ordering and review of radiographic studies, pulse oximetry and re-evaluation of patient's condition.   MDM   Final diagnoses:  DKA (diabetic ketoacidoses)    Pt is a 23 y.o. female with Pmhx as above who presents with hyperglycemia for 3 days. She also reports fatigue, polydypsia, polyuria. FSBG >600 at home. On PE, pt is tachypneic, tachycardic, but mentating well.  Lips dry. Abdominal exam benign.  W/U shows DKA with AB 36, Na 128, CO2 8, Cl 84.  WBC 15.7. LA 1.88. 2 L NS given and pt started on insulin gtt. She remains tachcardic 110-120's after IVF, but still mentating well w/ nml BP. Internal medicine service consulted for admission, requested CCM be contacted. CCM feels pt safe  for stepdown. IM service will admit to stepdown.         Neta Ehlers, MD 08/09/13 [place], MD 08/09/13 1039

## 2013-08-07 NOTE — ED Notes (Signed)
Pt 2nd bag placed on IV pump not dripping well.

## 2013-08-07 NOTE — ED Notes (Signed)
Lanelle Bal, RN made aware of abnormal lab test results

## 2013-08-07 NOTE — ED Notes (Addendum)
She states her blood sugar has been high at home this morning and she can not stop vomiting

## 2013-08-07 NOTE — Progress Notes (Signed)
Pt arrived from ED, glucomander/stabilizer initiated with insulin gtt. VSS, Will continue to monitor.

## 2013-08-07 NOTE — ED Notes (Signed)
Admitting MDs at bedside.

## 2013-08-08 LAB — HEMOGLOBIN A1C
Hgb A1c MFr Bld: 19.3 % — ABNORMAL HIGH (ref <5.7)
Mean Plasma Glucose: 507 mg/dL — ABNORMAL HIGH (ref <117)

## 2013-08-08 LAB — BASIC METABOLIC PANEL
BUN: 4 mg/dL — AB (ref 6–23)
BUN: 4 mg/dL — AB (ref 6–23)
BUN: 7 mg/dL (ref 6–23)
BUN: 7 mg/dL (ref 6–23)
BUN: 6 mg/dL (ref 6–23)
BUN: 7 mg/dL (ref 6–23)
CHLORIDE: 112 meq/L (ref 96–112)
CHLORIDE: 103 meq/L (ref 96–112)
CHLORIDE: 103 meq/L (ref 96–112)
CO2: 13 meq/L — AB (ref 19–32)
CO2: 13 meq/L — AB (ref 19–32)
CO2: 13 mEq/L — ABNORMAL LOW (ref 19–32)
CO2: 14 mEq/L — ABNORMAL LOW (ref 19–32)
CO2: 10 meq/L — AB (ref 19–32)
CO2: 12 mEq/L — ABNORMAL LOW (ref 19–32)
Calcium: 8.6 mg/dL (ref 8.4–10.5)
Calcium: 8.4 mg/dL (ref 8.4–10.5)
Calcium: 8.1 mg/dL — ABNORMAL LOW (ref 8.4–10.5)
Calcium: 8.2 mg/dL — ABNORMAL LOW (ref 8.4–10.5)
Calcium: 8.4 mg/dL (ref 8.4–10.5)
Calcium: 8.7 mg/dL (ref 8.4–10.5)
Chloride: 110 mEq/L (ref 96–112)
Chloride: 108 mEq/L (ref 96–112)
Chloride: 108 mEq/L (ref 96–112)
Creatinine, Ser: 0.49 mg/dL — ABNORMAL LOW (ref 0.50–1.10)
Creatinine, Ser: 0.47 mg/dL — ABNORMAL LOW (ref 0.50–1.10)
Creatinine, Ser: 0.56 mg/dL (ref 0.50–1.10)
Creatinine, Ser: 0.49 mg/dL — ABNORMAL LOW (ref 0.50–1.10)
Creatinine, Ser: 0.4 mg/dL — ABNORMAL LOW (ref 0.50–1.10)
Creatinine, Ser: 0.42 mg/dL — ABNORMAL LOW (ref 0.50–1.10)
GFR calc Af Amer: >90 mL/min (ref >90)
GFR calc Af Amer: >90 mL/min (ref >90)
GFR calc Af Amer: >90 mL/min (ref >90)
GFR calc Af Amer: >90 mL/min (ref >90)
GFR calc non Af Amer: >90 mL/min (ref >90)
GFR calc non Af Amer: >90 mL/min (ref >90)
GFR calc non Af Amer: >90 mL/min (ref >90)
GFR calc non Af Amer: >90 mL/min (ref >90)
GFR calc non Af Amer: >90 mL/min (ref >90)
GFR calc non Af Amer: >90 mL/min (ref >90)
GLUCOSE: 102 mg/dL — AB (ref 70–99)
GLUCOSE: 242 mg/dL — AB (ref 70–99)
GLUCOSE: 180 mg/dL — AB (ref 70–99)
Glucose, Bld: 108 mg/dL — ABNORMAL HIGH (ref 70–99)
Glucose, Bld: 202 mg/dL — ABNORMAL HIGH (ref 70–99)
Glucose, Bld: 235 mg/dL — ABNORMAL HIGH (ref 70–99)
POTASSIUM: 3.1 meq/L — AB (ref 3.7–5.3)
POTASSIUM: 3.4 meq/L — AB (ref 3.7–5.3)
POTASSIUM: 3.6 meq/L — AB (ref 3.7–5.3)
POTASSIUM: 3.8 meq/L (ref 3.7–5.3)
POTASSIUM: 3.7 meq/L (ref 3.7–5.3)
Potassium: 3.3 mEq/L — ABNORMAL LOW (ref 3.7–5.3)
SODIUM: 141 meq/L (ref 137–147)
Sodium: 145 mEq/L (ref 137–147)
Sodium: 143 mEq/L (ref 137–147)
Sodium: 140 mEq/L (ref 137–147)
Sodium: 134 mEq/L — ABNORMAL LOW (ref 137–147)
Sodium: 136 mEq/L — ABNORMAL LOW (ref 137–147)

## 2013-08-08 LAB — GLUCOSE, CAPILLARY
GLUCOSE-CAPILLARY: 105 mg/dL — AB (ref 70–99)
GLUCOSE-CAPILLARY: 206 mg/dL — AB (ref 70–99)
GLUCOSE-CAPILLARY: 230 mg/dL — AB (ref 70–99)
GLUCOSE-CAPILLARY: 145 mg/dL — AB (ref 70–99)
GLUCOSE-CAPILLARY: 140 mg/dL — AB (ref 70–99)
GLUCOSE-CAPILLARY: 277 mg/dL — AB (ref 70–99)
Glucose-Capillary: 108 mg/dL — ABNORMAL HIGH (ref 70–99)
Glucose-Capillary: 114 mg/dL — ABNORMAL HIGH (ref 70–99)
Glucose-Capillary: 121 mg/dL — ABNORMAL HIGH (ref 70–99)
Glucose-Capillary: 151 mg/dL — ABNORMAL HIGH (ref 70–99)
Glucose-Capillary: 159 mg/dL — ABNORMAL HIGH (ref 70–99)
Glucose-Capillary: 161 mg/dL — ABNORMAL HIGH (ref 70–99)
Glucose-Capillary: 167 mg/dL — ABNORMAL HIGH (ref 70–99)
Glucose-Capillary: 192 mg/dL — ABNORMAL HIGH (ref 70–99)
Glucose-Capillary: 192 mg/dL — ABNORMAL HIGH (ref 70–99)
Glucose-Capillary: 213 mg/dL — ABNORMAL HIGH (ref 70–99)
Glucose-Capillary: 257 mg/dL — ABNORMAL HIGH (ref 70–99)
Glucose-Capillary: 264 mg/dL — ABNORMAL HIGH (ref 70–99)
Glucose-Capillary: 271 mg/dL — ABNORMAL HIGH (ref 70–99)
Glucose-Capillary: 315 mg/dL — ABNORMAL HIGH (ref 70–99)
Glucose-Capillary: 84 mg/dL (ref 70–99)
Glucose-Capillary: 89 mg/dL (ref 70–99)
Glucose-Capillary: 97 mg/dL (ref 70–99)

## 2013-08-08 LAB — URINE CULTURE

## 2013-08-08 LAB — PHOSPHORUS: PHOSPHORUS: 1.1 mg/dL — AB (ref 2.3–4.6)

## 2013-08-08 LAB — TROPONIN I: Troponin I: <0.3 ng/mL (ref <0.30)

## 2013-08-08 MED ORDER — SODIUM CHLORIDE 0.9 % IV SOLN
INTRAVENOUS | Status: DC
  Filled 2013-08-08 (×2): qty 1000

## 2013-08-08 MED ORDER — SODIUM BICARBONATE 650 MG PO TABS
650.0000 mg | ORAL_TABLET | Freq: Three times a day (TID) | ORAL | Status: DC
  Administered 2013-08-08 – 2013-08-09 (×2): 650 mg via ORAL
  Filled 2013-08-08 (×3): qty 1

## 2013-08-08 MED ORDER — K PHOS MONO-SOD PHOS DI & MONO 155-852-130 MG PO TABS
500.0000 mg | ORAL_TABLET | Freq: Three times a day (TID) | ORAL | Status: AC
  Administered 2013-08-08 – 2013-08-09 (×4): 500 mg via ORAL
  Filled 2013-08-08 (×6): qty 2

## 2013-08-08 MED ORDER — POTASSIUM CHLORIDE 10 MEQ/100ML IV SOLN
10.0000 meq | INTRAVENOUS | Status: AC
  Administered 2013-08-08 (×3): 10 meq via INTRAVENOUS
  Filled 2013-08-08: qty 100

## 2013-08-08 MED ORDER — DEXTROSE-NACL 5-0.9 % IV SOLN
INTRAVENOUS | Status: DC
  Administered 2013-08-08 – 2013-08-10 (×4): via INTRAVENOUS

## 2013-08-08 MED ORDER — SODIUM CHLORIDE 0.9 % IV BOLUS (SEPSIS)
1000.0000 mL | Freq: Once | INTRAVENOUS | Status: AC
  Administered 2013-08-08: 1000 mL via INTRAVENOUS

## 2013-08-08 NOTE — Progress Notes (Signed)
Utilization Review Completed.  

## 2013-08-08 NOTE — Progress Notes (Signed)
Patient stating that she is hungry and will leave AMA if unable to eat. MD notified and at bedside. MD agreed for patient to eat dinner tonight and RN will cover carbs per [employer]. Patient agreeable to plan. Will continue to monitor.

## 2013-08-08 NOTE — Progress Notes (Signed)
Subjective: Patient states that she's feeling much better this morning. She no longer has shortness of breath, nausea, or malaise.  Her glucose levels have also begun stabilizing. She has no complaints this morning and denies headaches, chest pain or abdominal pain.   Objective: Vital signs in last 24 hours: Filed Vitals:   08/07/13 2318 08/08/13 0000 08/08/13 0325 08/08/13 0700  BP: 103/71  106/60 109/63  Pulse: 107  106   Temp:  97.5 F (36.4 C) 98.7 F (37.1 C) 98.1 F (36.7 C)  TempSrc:  Oral Oral Oral  Resp: 21  14   SpO2: 100%  100%    Weight change:   Intake/Output Summary (Last 24 hours) at 08/08/13 0913 Last data filed at 08/08/13 0900  Gross per 24 hour  Intake 1507.5 ml  Output    252 ml  Net 1255.5 ml    Physical Exam:  General appearance: Alert and oriented x3. Patient is lying comfortably and in good spirits.  HEENT: Pupils are equal, round and reactive. Extra occular movements are intact. Conjunctivae clear. Mucous membranes are moist.  Cardiovascular: Tachycardic, normal S1S2, no murmurs/rubs/gallops appreciated.  Pulmonary: Normal work of breathing. Clear to auscultation bilaterally.  Abdomen: Normoactive bowel sounds. Soft, non-tender/non-distended. No organomegaly.  Extremities: Warm and well-perfused. No peripheral edema noted. Pulses 2+ bilaterally.  Skin: Skin color, texture, turgor normal. No rashes or lesions.   Lab Results: BMET    Component Value Date/Time   NA 141 08/08/2013 0700   K 3.1* 08/08/2013 0700   CL 108 08/08/2013 0700   CO2 13* 08/08/2013 0700   GLUCOSE 202* 08/08/2013 0700   BUN 7 08/08/2013 0700   CREATININE 0.56 08/08/2013 0700   CREATININE 0.59 11/06/2011 1556   CALCIUM 8.1* 08/08/2013 0700   GFRNONAA >90 08/08/2013 0700   GFRNONAA >89 11/06/2011 1556   GFRAA >90 08/08/2013 0700   GFRAA >89 11/06/2011 1556   CBC    Component Value Date/Time   WBC 25.3* 08/07/2013 1959   RBC 4.96 08/07/2013 1959   HGB 14.4 08/07/2013 1959   HCT  41.4 08/07/2013 1959   PLT 397 08/07/2013 1959   MCV 83.5 08/07/2013 1959   MCH 29.0 08/07/2013 1959   MCHC 34.8 08/07/2013 1959   RDW 13.6 08/07/2013 1959   LYMPHSABS 2.4 07/05/2013 1218   MONOABS 0.5 07/05/2013 1218   EOSABS 0.2 07/05/2013 1218   BASOSABS 0.0 07/05/2013 1218   CBG (last 3)   Recent Labs  08/08/13 0507 08/08/13 0606 08/08/13 0657  GLUCAP 151* 206* 230*   Troponin (Point of Care Test) No results found for this basename: TROPIPOC,  in the last 72 hours  Hemoglobin A1c - 19.3 Urine Pregnancy - Negative  Micro Results: Recent Results (from the past 240 hour(s))  MRSA PCR SCREENING     Status: None   Collection Time    08/07/13  6:11 PM      Result Value Ref Range Status   MRSA by PCR NEGATIVE  NEGATIVE Final   Comment:            The GeneXpert MRSA Assay (FDA     approved for NASAL specimens     only), is one component of a     comprehensive MRSA colonization     surveillance program. It is not     intended to diagnose MRSA     infection nor to guide or     monitor treatment for     MRSA infections.   Studies/Results:  Dg Chest Port 1 View  08/07/2013   CLINICAL DATA:  Shortness of breath, respiratory difficulty.  EXAM: PORTABLE CHEST - 1 VIEW  COMPARISON:  DG CHEST 1V PORT dated 10/26/2012  FINDINGS: The heart size and mediastinal contours are within normal limits. Both lungs are clear. The visualized skeletal structures are unremarkable. Gas distended stomach.  IMPRESSION: No acute cardiopulmonary process.   Electronically Signed   By: Elon Alas   On: 08/07/2013 20:54   Medications: I have reviewed the patient's current medications. Scheduled Meds: . heparin  5,000 Units Subcutaneous 3 times per day  . phosphorus  500 mg Oral TID  . potassium chloride  10 mEq Intravenous Q1 Hr x 3   Continuous Infusions: . sodium chloride    . dextrose 5 % and 0.45% NaCl 150 mL/hr at 08/08/13 0738  . insulin (NOVOLIN-R) infusion    . sodium chloride 0.9 % 1,000 mL  with sodium bicarbonate 100 mEq infusion 150 mL/hr at 08/07/13 2300   PRN Meds:.dextrose  Assessment/Plan: #DKA - Patient meets criteria for DKA with glucose of 773, pH of 7.20, Bicarb <7, with ketonuria and an AG of 35 on admission.  She's had previous admissions of DKA in the past (March 8, January 17th) because of not taking (or running out of) her insulin, so medical non-compliance is the likely cause.  We're also pursuing an infectious work-up (Blood & Urine Cxs) to rule out infection as the precipitating factor given her white count of 15.7 on admission (however she has been afebrile and denies fever/chills, cough, or dysuria).  Troponins x3 to rule out unlikely MI given epigastric discomfort on presentation.  - Observing on tele - DKA protocol (continue insulin gtt, IVFs, NPO)  - IVF: D5-1/2NS given BG < 250  - Monitor K and replete as necessary - Given 1 amp of bicarbonate IV  - BMETs q2h until AG closed x 2   - Start [place] insulin once AG closed x 2. Continue insulin drip for 2 hours after  [place] insulin started   - Blood cultures x2 & Urine culture - Consult to social work, diabetes educator   #DM1 (uncontrolled) - History of DM1 (diagnosed at age 45; A1c 17 in 04/2013).  Past hospitalizations have been due to medication non-compliance. On Novolin 70/30 15 units BID. Need strict outpatient monitoring.  - On Insulin Gtt   This is a Careers information officer Note.  The care of the patient was discussed with Dr. Lucila Maine and the assessment and plan formulated with their assistance.  Please see their attached note for official documentation of the daily encounter.   LOS: 1 day   Isac Sarna, Med Student 08/08/2013, 9:13 AM

## 2013-08-08 NOTE — Progress Notes (Signed)
I have seen the patient and reviewed the daily progress note by Bess Harvest MS 4 and discussed the care of the patient with them.  See below for documentation of my findings, assessment, and plans.  Subjective: Patient seen and examined at the bedside this morning. She feels better. Denies pain, SOB, fever. She is thirsty.  Objective: Vital signs in last 24 hours: Filed Vitals:   08/07/13 2318 08/08/13 0000 08/08/13 0325 08/08/13 0700  BP: 103/71  106/60 109/63  Pulse: 107  106   Temp:  97.5 F (36.4 C) 98.7 F (37.1 C) 98.1 F (36.7 C)  TempSrc:  Oral Oral Oral  Resp: 21  14   SpO2: 100%  100%    Weight change:   Intake/Output Summary (Last 24 hours) at 08/08/13 1043 Last data filed at 08/08/13 0900  Gross per 24 hour  Intake 1507.5 ml  Output    252 ml  Net 1255.5 ml   Physical Exam  Constitutional: She is oriented to person, place, and time. Vital signs are normal. No distress. HENT:  Head: Normocephalic and atraumatic.  Eyes: Conjunctivae and EOM are normal. Pupils are equal, round, and reactive to light.  Neck: Normal range of motion. Neck supple.  Cardiovascular: Regular rhythm, normal heart sounds and intact distal pulses. Tachycardia present. Exam reveals no gallop and no friction rub.  No murmur heard.  Pulmonary/Chest: Breath sounds normal. She has no wheezes. She has no rales. She exhibits no tenderness.  Abdominal: Soft. Bowel sounds are normal. She exhibits no distension and no mass. There is no tenderness. There is no rebound and no guarding.  Musculoskeletal: Normal range of motion. She exhibits no edema and no tenderness.  Neurological: She is alert and oriented to person, place, and time. No cranial nerve deficit. GCS score is 15.  Skin: Skin is warm and dry. No rash noted. No erythema. No pallor.  Psychiatric: Mood and affect normal.   Lab Results: Reviewed and documented in Electronic Record Micro Results: Reviewed and documented in Electronic  Record Studies/Results: Reviewed and documented in Electronic Record Medications: I have reviewed the patient's current medications. Scheduled Meds: . heparin  5,000 Units Subcutaneous 3 times per day  . phosphorus  500 mg Oral TID   Continuous Infusions: . sodium chloride    . dextrose 5 % and 0.45% NaCl 150 mL/hr at 08/08/13 0738  . insulin (NOVOLIN-R) infusion    . sodium chloride 0.9 % 1,000 mL with sodium bicarbonate 100 mEq infusion 150 mL/hr at 08/07/13 2300   PRN Meds:.dextrose Assessment/Plan: KENAN VANDALE is a 23 year old with uncontrolled DM1 (A1C 17% in 04/2013) presenting in DKA.   #DKA - Multiple admissions for DKA in the past. A1C 19.3. Infectious work up negative so far. CXR unremarkable. AVSS, no fever, she was no longer tachycardic on my assessment. Troponins negative x3. CBGs have improved, but gap remains elevated at 20. - DKA protocol (continue insulin gtt, IVFs, NPO)  - IVF D5-0.5NS given BG < 250  - BMETs q2h until AG closed x 2  - Monitor K and replete as necessary  - Supplementing phos (1.1) - Start [place] insulin once AG closed x 2  - Continue insulin drip for 2 hours after [place] insulin started  - Blood cultures 4/10 > Pending  - UDS pending - Consult to diabetes educator   Glucose-Capillary  Date Value Ref Range Status  08/08/2013 230* 70 - 99 mg/dL Final  08/08/2013 206* 70 - 99 mg/dL  Final  08/08/2013 151* 70 - 99 mg/dL Final  08/08/2013 108* 70 - 99 mg/dL Final  08/08/2013 105* 70 - 99 mg/dL Final  08/08/2013 161* 70 - 99 mg/dL Final  08/08/2013 167* 70 - 99 mg/dL Final  08/07/2013 192* 70 - 99 mg/dL Final  08/07/2013 253* 70 - 99 mg/dL Final  08/07/2013 264* 70 - 99 mg/dL Final    #GERD - PPI.  #DVT PPX- Heparin subq.   Dispo: Disposition is deferred at this time, awaiting improvement of current medical problems.  Anticipated discharge in approximately 1-3 day(s).   The patient does have a current PCP Cresenciano Genre, MD) and does need an [facility]  hospital follow-up appointment after discharge.  The patient does not have transportation limitations that hinder transportation to clinic appointments.  .Services Needed at time of discharge: Y = Yes, Blank = No PT:   OT:   RN:   Equipment:   Other:     LOS: 1 day   Lesly Dukes, MD 08/08/2013, 10:43 AM  Lesly Dukes, MD  [email or web address] Pager # [phone] After hours and weekends # [phone] Office # [phone]

## 2013-08-08 NOTE — H&P (Signed)
Internal Medicine Attending Admission Note Date: 08/08/2013  Patient name: Ashley Freeman Medical record number: [id number] Date of birth: 07/05/1990 Age: 23 y.o. Gender: female  I saw and evaluated the patient. I reviewed the resident's note and I agree with the resident's findings and plan as documented in the resident's note, with the following additional comments.  Chief Complaint(s): Fatigability; malaise; polyuria  History - key components related to admission: Patient is a 23 year old woman with poorly controlled type 1 diabetes mellitus admitted with complaint of fatigability/malaise and polyuria.  She denies chest pain, abdominal pain, vomiting, fever, chills.  She says that she has been compliant with her insulin regimen.   Physical Exam - key components related to admission:  Filed Vitals:   08/07/13 2318 08/08/13 0000 08/08/13 0325 08/08/13 0700  BP: 103/71  106/60 109/63  Pulse: 107  106   Temp:  97.5 F (36.4 C) 98.7 F (37.1 C) 98.1 F (36.7 C)  TempSrc:  Oral Oral Oral  Resp: 21  14   SpO2: 100%  100%    General: Alert, no distress Lungs: Clear Heart: Regular; no extra sounds or murmurs Abdomen: Bowel sounds present, soft, nontender Extremities: No edema   Lab results:   Basic Metabolic Panel:  Recent Labs  08/07/13 0015  08/07/13 2210 08/08/13 0400  NA 143  < > 143 145  K 3.7  < > 4.5 3.6*  CL 110  < > 105 112  CO2 10*  < > 7* 13*  GLUCOSE 180*  < > 242* 108*  BUN 7  < > 8 7  CREATININE 0.47*  < > 0.61 0.49*  CALCIUM 8.4  < > 9.5 8.6  PHOS 1.1*  --   --   --   < > = values in this interval not displayed.   Liver Function Tests:  Recent Labs  08/07/13 1255  AST 19  ALT 12  ALKPHOS 111  BILITOT 0.2*  PROT 9.2*  ALBUMIN 4.5     CBC:  Recent Labs  08/07/13 1255 08/07/13 1959  WBC 15.7* 25.3*  HGB 15.8* 14.4  HCT 44.4 41.4  MCV 83.5 83.5  PLT 369 397     Cardiac Enzymes:  Recent Labs  08/07/13 0015 08/07/13 1959  08/08/13 0400  TROPONINI <0.30 <0.30 <0.30     CBG:  Recent Labs  08/08/13 0141 08/08/13 0248 08/08/13 0358 08/08/13 0507 08/08/13 0606 08/08/13 0657  GLUCAP 161* 105* 108* 151* 206* 230*    Hemoglobin A1C:  Recent Labs  08/07/13 1959  HGBA1C 19.3*      Urinalysis    Component Value Date/Time   COLORURINE YELLOW 08/07/2013 1518   APPEARANCEUR CLEAR 08/07/2013 1518   LABSPEC 1.034* 08/07/2013 1518   PHURINE 5.0 08/07/2013 1518   GLUCOSEU >1000* 08/07/2013 1518   HGBUR NEGATIVE 08/07/2013 1518   BILIRUBINUR NEGATIVE 08/07/2013 1518   KETONESUR >80* 08/07/2013 1518   PROTEINUR NEGATIVE 08/07/2013 1518   UROBILINOGEN 0.2 08/07/2013 1518   NITRITE NEGATIVE 08/07/2013 1518   LEUKOCYTESUR NEGATIVE 08/07/2013 1518    Urine microscopic:  Recent Labs  08/07/13 1518  EPIU FEW*  BACTERIA RARE  OTHERU RARE YEAST    Lab Results  Component Value Date   PREGTESTUR NEGATIVE 08/07/2013    Imaging results:  Dg Chest Port 1 View  08/07/2013   CLINICAL DATA:  Shortness of breath, respiratory difficulty.  EXAM: PORTABLE CHEST - 1 VIEW  COMPARISON:  DG CHEST 1V PORT dated 10/26/2012  FINDINGS: The heart  size and mediastinal contours are within normal limits. Both lungs are clear. The visualized skeletal structures are unremarkable. Gas distended stomach.  IMPRESSION: No acute cardiopulmonary process.   Electronically Signed   By: Elon Alas   On: 08/07/2013 20:54     Assessment & Plan by Problem:  1.  Diabetic ketoacidosis.  Patient has poorly controlled type 1 diabetes mellitus, and presented with DKA.  The precipitating event is not clear; she says that she has been taking her insulin without missing any doses.   Her last 2 episodes of DKA apparently occurred when she ran out of insulin.  She has no signs or symptoms of an acute illness that may have triggered this. The plan is IV insulin per protocol; normal saline volume replacement; follow CBGs, electrolytes, and renal  function; transition to PO diet and subcutaneous insulin once the DKA has corrected.  2.  Poorly controlled type 1 diabetes mellitus.  Once DKA has corrected, plan is to follow CBGs and adjust insulin regimen as indicated; diabetes education.  3.  Leukocytosis.  Likely due to #1; plan is follow for resolution; monitor for any signs of underlying infection.  4.  Other problems and plans as per the resident physician's note.

## 2013-08-09 DIAGNOSIS — D72829 Elevated white blood cell count, unspecified: Secondary | ICD-10-CM

## 2013-08-09 LAB — BASIC METABOLIC PANEL
BUN: 4 mg/dL — AB (ref 6–23)
BUN: 3 mg/dL — ABNORMAL LOW (ref 6–23)
BUN: 3 mg/dL — ABNORMAL LOW (ref 6–23)
BUN: <3 mg/dL — ABNORMAL LOW (ref 6–23)
CALCIUM: 8.6 mg/dL (ref 8.4–10.5)
CHLORIDE: 102 meq/L (ref 96–112)
CHLORIDE: 103 meq/L (ref 96–112)
CO2: 16 meq/L — AB (ref 19–32)
CO2: 13 meq/L — AB (ref 19–32)
CO2: 15 mEq/L — ABNORMAL LOW (ref 19–32)
CO2: 15 mEq/L — ABNORMAL LOW (ref 19–32)
CREATININE: 0.43 mg/dL — AB (ref 0.50–1.10)
CREATININE: 0.42 mg/dL — AB (ref 0.50–1.10)
CREATININE: 0.42 mg/dL — AB (ref 0.50–1.10)
Calcium: 9.1 mg/dL (ref 8.4–10.5)
Calcium: 7.8 mg/dL — ABNORMAL LOW (ref 8.4–10.5)
Calcium: 8.3 mg/dL — ABNORMAL LOW (ref 8.4–10.5)
Chloride: 101 mEq/L (ref 96–112)
Chloride: 103 mEq/L (ref 96–112)
Creatinine, Ser: 0.38 mg/dL — ABNORMAL LOW (ref 0.50–1.10)
GFR calc Af Amer: >90 mL/min (ref >90)
GFR calc Af Amer: >90 mL/min (ref >90)
GFR calc Af Amer: >90 mL/min (ref >90)
GFR calc non Af Amer: >90 mL/min (ref >90)
GFR calc non Af Amer: >90 mL/min (ref >90)
GFR calc non Af Amer: >90 mL/min (ref >90)
GFR calc non Af Amer: >90 mL/min (ref >90)
GLUCOSE: 172 mg/dL — AB (ref 70–99)
Glucose, Bld: 112 mg/dL — ABNORMAL HIGH (ref 70–99)
Glucose, Bld: 213 mg/dL — ABNORMAL HIGH (ref 70–99)
Glucose, Bld: 155 mg/dL — ABNORMAL HIGH (ref 70–99)
Potassium: 2.9 mEq/L — CL (ref 3.7–5.3)
Potassium: 3 mEq/L — ABNORMAL LOW (ref 3.7–5.3)
Potassium: 3.7 mEq/L (ref 3.7–5.3)
Potassium: 3.4 mEq/L — ABNORMAL LOW (ref 3.7–5.3)
Sodium: 136 mEq/L — ABNORMAL LOW (ref 137–147)
Sodium: 135 mEq/L — ABNORMAL LOW (ref 137–147)
Sodium: 134 mEq/L — ABNORMAL LOW (ref 137–147)
Sodium: 136 mEq/L — ABNORMAL LOW (ref 137–147)

## 2013-08-09 LAB — GLUCOSE, CAPILLARY
GLUCOSE-CAPILLARY: 113 mg/dL — AB (ref 70–99)
GLUCOSE-CAPILLARY: 130 mg/dL — AB (ref 70–99)
GLUCOSE-CAPILLARY: 133 mg/dL — AB (ref 70–99)
GLUCOSE-CAPILLARY: 136 mg/dL — AB (ref 70–99)
GLUCOSE-CAPILLARY: 139 mg/dL — AB (ref 70–99)
GLUCOSE-CAPILLARY: 157 mg/dL — AB (ref 70–99)
GLUCOSE-CAPILLARY: 175 mg/dL — AB (ref 70–99)
GLUCOSE-CAPILLARY: 177 mg/dL — AB (ref 70–99)
GLUCOSE-CAPILLARY: 192 mg/dL — AB (ref 70–99)
GLUCOSE-CAPILLARY: 199 mg/dL — AB (ref 70–99)
GLUCOSE-CAPILLARY: 199 mg/dL — AB (ref 70–99)
GLUCOSE-CAPILLARY: 229 mg/dL — AB (ref 70–99)
GLUCOSE-CAPILLARY: 234 mg/dL — AB (ref 70–99)
Glucose-Capillary: 118 mg/dL — ABNORMAL HIGH (ref 70–99)
Glucose-Capillary: 152 mg/dL — ABNORMAL HIGH (ref 70–99)
Glucose-Capillary: 166 mg/dL — ABNORMAL HIGH (ref 70–99)
Glucose-Capillary: 171 mg/dL — ABNORMAL HIGH (ref 70–99)
Glucose-Capillary: 171 mg/dL — ABNORMAL HIGH (ref 70–99)
Glucose-Capillary: 174 mg/dL — ABNORMAL HIGH (ref 70–99)
Glucose-Capillary: 211 mg/dL — ABNORMAL HIGH (ref 70–99)

## 2013-08-09 LAB — CBC
HCT: 36.7 % (ref 36.0–46.0)
Hemoglobin: 13.2 g/dL (ref 12.0–15.0)
MCH: 29.1 pg (ref 26.0–34.0)
MCHC: 36 g/dL (ref 30.0–36.0)
MCV: 81 fL (ref 78.0–100.0)
PLATELETS: 319 10*3/uL (ref 150–400)
RBC: 4.53 MIL/uL (ref 3.87–5.11)
RDW: 13.6 % (ref 11.5–15.5)
WBC: 13.4 10*3/uL — AB (ref 4.0–10.5)

## 2013-08-09 LAB — RAPID URINE DRUG SCREEN, HOSP PERFORMED
Amphetamines: NOT DETECTED
Barbiturates: NOT DETECTED
Benzodiazepines: NOT DETECTED
Cocaine: NOT DETECTED
Opiates: NOT DETECTED
Tetrahydrocannabinol: NOT DETECTED

## 2013-08-09 MED ORDER — POTASSIUM CHLORIDE CRYS ER 20 MEQ PO TBCR
40.0000 meq | EXTENDED_RELEASE_TABLET | Freq: Once | ORAL | Status: AC
  Administered 2013-08-09: 40 meq via ORAL
  Filled 2013-08-09: qty 2

## 2013-08-09 MED ORDER — INSULIN REGULAR BOLUS VIA INFUSION
0.0000 [IU] | Freq: Three times a day (TID) | INTRAVENOUS | Status: DC
  Administered 2013-08-09: 5.7 [IU] via INTRAVENOUS
  Administered 2013-08-10: 3.2 [IU] via INTRAVENOUS
  Filled 2013-08-09 (×2): qty 10

## 2013-08-09 MED ORDER — WHITE PETROLATUM GEL
Status: AC
  Administered 2013-08-09: 0.2
  Filled 2013-08-09: qty 5

## 2013-08-09 MED ORDER — SODIUM BICARBONATE 650 MG PO TABS
650.0000 mg | ORAL_TABLET | Freq: Three times a day (TID) | ORAL | Status: AC
  Administered 2013-08-09 (×2): 650 mg via ORAL
  Filled 2013-08-09 (×2): qty 1

## 2013-08-09 NOTE — Progress Notes (Signed)
Subjective: Pt threatened to leave AMA last night unless she could eat dinner. She promised to stay in the hospital if we would let her eat. She was provided dinner last night and the carbohydrates were covered with the insulin drip by nursing. Her AG continues to close and her bicarb has improved. She is feeling good this morning and denies any SOB, chest pain, N/V, or abdominal pain.   Objective: Vital signs in last 24 hours: Filed Vitals:   08/08/13 2040 08/08/13 2330 08/09/13 0350 08/09/13 0700  BP: 114/76 120/73 110/63 108/71  Pulse: 102 98 90 86  Temp: 97.9 F (36.6 C) 98 F (36.7 C) 97.9 F (36.6 C) 98 F (36.7 C)  TempSrc: Oral Oral Oral Oral  Resp: 24 20 22 17   SpO2: 97% 100% 100%    Weight change:   Intake/Output Summary (Last 24 hours) at 08/09/13 1027 Last data filed at 08/09/13 0700  Gross per 24 hour  Intake 1557.5 ml  Output      0 ml  Net 1557.5 ml   Vitals reviewed. General: Resting in bed, NAD HEENT: PERRL, EOMI Cardiac: RRR, no rubs, murmurs or gallops Pulm: Clear to auscultation bilaterally, no wheezes, rales, or rhonchi Abd: Soft, nontender, nondistended, BS present Ext: Warm and well perfused, no pedal edema Neuro: Alert and oriented X3, cranial nerves II-XII grossly intact, strength and sensation equal in bilateral upper and lower extremities  Lab Results: Basic Metabolic Panel:  Recent Labs Lab 08/07/13 0015  08/09/13 0406 08/09/13 0610  NA 143  < > 135* 134*  K 3.7  < > 3.0* 3.7  CL 110  < > 101 103  CO2 10*  < > 13* 15*  GLUCOSE 180*  < > 172* 213*  BUN 7  < > 3* 3*  CREATININE 0.47*  < > 0.42* 0.38*  CALCIUM 8.4  < > 8.6 7.8*  PHOS 1.1*  --   --   --   < > = values in this interval not displayed. Liver Function Tests:  Recent Labs Lab 08/07/13 1255  AST 19  ALT 12  ALKPHOS 111  BILITOT 0.2*  PROT 9.2*  ALBUMIN 4.5   CBC:  Recent Labs Lab 08/07/13 1959 08/09/13 0205  WBC 25.3* 13.4*  HGB 14.4 13.2  HCT 41.4 36.7    MCV 83.5 81.0  PLT 397 319   Cardiac Enzymes:  Recent Labs Lab 08/07/13 0015 08/07/13 1959 08/08/13 0400  TROPONINI <0.30 <0.30 <0.30   CBG:  Recent Labs Lab 08/09/13 0237 08/09/13 0345 08/09/13 0449 08/09/13 0723 08/09/13 0826 08/09/13 0945  GLUCAP 133* 166* 175* 192* 174* 171*   Hemoglobin A1C:  Recent Labs Lab 08/07/13 1959  HGBA1C 19.3*   Urine Drug Screen: Drugs of Abuse     Component Value Date/Time   LABOPIA NONE DETECTED 08/09/2013 0005   LABOPIA NEGATIVE 12/18/2009 1453   COCAINSCRNUR NONE DETECTED 08/09/2013 0005   COCAINSCRNUR NEGATIVE 12/18/2009 1453   LABBENZ NONE DETECTED 08/09/2013 0005   LABBENZ NEGATIVE 12/18/2009 1453   AMPHETMU NONE DETECTED 08/09/2013 0005   AMPHETMU NEGATIVE 12/18/2009 1453   THCU NONE DETECTED 08/09/2013 0005   LABBARB NONE DETECTED 08/09/2013 0005   Urinalysis:  Recent Labs Lab 08/07/13 1518  COLORURINE YELLOW  LABSPEC 1.034*  PHURINE 5.0  GLUCOSEU >1000*  HGBUR NEGATIVE  BILIRUBINUR NEGATIVE  KETONESUR >80*  PROTEINUR NEGATIVE  UROBILINOGEN 0.2  NITRITE NEGATIVE  LEUKOCYTESUR NEGATIVE    Micro Results: Recent Results (from the past 240  hour(s))  URINE CULTURE     Status: None   Collection Time    08/07/13  3:18 PM      Result Value Ref Range Status   Specimen Description URINE, CLEAN CATCH   Final   Special Requests NONE   Final   Culture  Setup Time     Final   Value: 08/07/2013 16:30     Performed at [facility] Count     Final   Value: 30,000 COLONIES/ML     Performed at [facility]   Culture     Final   Value: YEAST     Performed at [facility]   Report Status 08/08/2013 FINAL   Final  MRSA PCR SCREENING     Status: None   Collection Time    08/07/13  6:11 PM      Result Value Ref Range Status   MRSA by PCR NEGATIVE  NEGATIVE Final   Comment:            The GeneXpert MRSA Assay (FDA     approved for NASAL specimens     only), is one component of a      comprehensive MRSA colonization     surveillance program. It is not     intended to diagnose MRSA     infection nor to guide or     monitor treatment for     MRSA infections.   Studies/Results: Dg Chest Port 1 View  08/07/2013   CLINICAL DATA:  Shortness of breath, respiratory difficulty.  EXAM: PORTABLE CHEST - 1 VIEW  COMPARISON:  DG CHEST 1V PORT dated 10/26/2012  FINDINGS: The heart size and mediastinal contours are within normal limits. Both lungs are clear. The visualized skeletal structures are unremarkable. Gas distended stomach.  IMPRESSION: No acute cardiopulmonary process.   Electronically Signed   By: Elon Alas   On: 08/07/2013 20:54   Medications: I have reviewed the patient's current medications. Scheduled Meds: . heparin  5,000 Units Subcutaneous 3 times per day  . insulin regular  0-10 Units Intravenous TID WC  . phosphorus  500 mg Oral TID  . sodium bicarbonate  650 mg Oral TID   Continuous Infusions: . dextrose 5 % and 0.9% NaCl 150 mL/hr at 08/08/13 2137  . insulin (NOVOLIN-R) infusion 2.2 Units/hr (08/09/13 0947)   PRN Meds:.dextrose  Assessment/Plan: Ashley Freeman is a 23 year old with uncontrolled DM1 (A1C 17% in 04/2013) presenting in DKA.   #DKA: Multiple admissions for DKA in the past. A1C 19.3. Infectious work up negative so far. CXR unremarkable. VSS, afebrile with improving leukocytosis. Troponins negative x3. CBGs have improved, anion gap improved to 16 this morning.  - DKA protocol (continue insulin gtt, IVFs, NPO)  - IVF D5-0.5NS given BG < 250  - BMETs q2h until AG closed x 2  - Monitor K and replete as necessary  - Start [place] insulin once AG closed x 2  - Continue insulin drip for 2 hours after [place] insulin started  - Blood cultures 4/10 > Pending  - UDS pending  - Consult to diabetes educator   #Leukocytosis: White count up to 23.3 this admission, likely reactive over infectious. Improved to 13.4 today.  Afebrile, CXR normal, lungs are  clear, urine culture with some yeast, no bacteria, blood cultures are NGTD. Will continue to monitor. - am CBC  #GERD: PPI.   #DVT PPX: [place] heparin   Dispo: Disposition is  deferred at this time, awaiting improvement of current medical problems.  Anticipated discharge in approximately 1-2 day(s).   The patient does have a current PCP Cresenciano Genre, MD) and does need an [facility] hospital follow-up appointment after discharge.  The patient does have transportation limitations that hinder transportation to clinic appointments.  .Services Needed at time of discharge: Y = Yes, Blank = No PT:   OT:   RN:   Equipment:   Other:     LOS: 2 days   Otho Bellows, MD 08/09/2013, 10:27 AM

## 2013-08-09 NOTE — Progress Notes (Signed)
Inpatient Diabetes Program Recommendations  AACE/ADA: New Consensus Statement on Inpatient Glycemic Control (2013)  Target Ranges:  Prepandial:   less than 140 mg/dL      Peak postprandial:   less than 180 mg/dL (1-2 hours)      Critically ill patients:  140 - 180 mg/dL  Results for NEOSHA, SWITALSKI (MRN [id number]) as of 08/09/2013 09:53  Ref. Range 08/08/2013 21:36 08/08/2013 22:39 08/08/2013 23:39 08/09/2013 00:45 08/09/2013 01:32 08/09/2013 02:37 08/09/2013 03:45 08/09/2013 04:49  Glucose-Capillary Latest Range: 70-99 mg/dL 213 (H) 192 (H) 159 (H) 118 (H) 113 (H) 133 (H) 166 (H) 175 (H)    Diabetes history: DM1 Outpatient Diabetes medications: 70/30 15 units BID Current orders for Inpatient glycemic control: DKA: Novolin R insulin drip via Glucostabilizer  Inpatient Diabetes Program Recommendations Insulin - IV drip/GlucoStabilizer: Currently on Novolin R insulin drip and according to labs patient remains acidotic and is not ready to transition from IV to SQ insulin at this time.  Note: Diabetes Coordinator consult received. Patient has been seen multiple times by Diabetes Coordinator due to frequent DKA admissions. Garnette Czech, RN, CDE, Inpatient Diabetes Coordinator last talked face to face with patient on  07/07/13.  Patient was again admitted with DKA and according to labs at 6:10 am, CO2 is 15 and AG is 16.  Therefore, patient is NOT ready to transition to subcutaneous insulin at this time.  Please continue insulin drip until DKA criteria to transition are met.  IV insulin/GlucoStabilizer must be continued until acidosis is resolved (venous CO2 > 20, normal anion gap) and glucose levels within range for at least 4 consecutive hours.  Will continue to follow.  Thanks, Barnie Alderman, RN, MSN, CCRN Diabetes Coordinator Inpatient Diabetes Program [phone] (Team Pager) [phone] ([facility] office) [phone] [facility] office)

## 2013-08-09 NOTE — Progress Notes (Signed)
Internal Medicine Attending  Date: 08/09/2013  Patient name: Ashley Freeman Medical record number: [id number] Date of birth: Apr 30, 1991 Age: 23 y.o. Gender: female  I saw and evaluated the patient. I reviewed the resident's note by Dr. Eulas Post and I agree with the resident's findings and plans as documented in her note.

## 2013-08-09 NOTE — Progress Notes (Signed)
CRITICAL VALUE ALERT  Critical value received:  K 2.9  Date of notification:  08/09/13  Time of notification:  0256  Critical value read back:yes  Nurse who received alert:  D. Aleene Davidson, RN  MD notified (1st page):  Dr. Stann Mainland  Time of first page:  0320  MD notified (2nd page):  Time of second page:  Responding MD:  Dr. Stann Mainland  Time MD responded:  [phone]  Notified Dr. Stann Mainland that patient's blood sample was hemolyzed per Lab. Continue present orders

## 2013-08-10 LAB — BASIC METABOLIC PANEL
BUN: <3 mg/dL — ABNORMAL LOW (ref 6–23)
BUN: 4 mg/dL — ABNORMAL LOW (ref 6–23)
BUN: 3 mg/dL — ABNORMAL LOW (ref 6–23)
BUN: 3 mg/dL — ABNORMAL LOW (ref 6–23)
CO2: 16 mEq/L — ABNORMAL LOW (ref 19–32)
CO2: 15 meq/L — AB (ref 19–32)
CO2: 19 meq/L (ref 19–32)
CO2: 23 meq/L (ref 19–32)
Calcium: 8.6 mg/dL (ref 8.4–10.5)
Calcium: 8.3 mg/dL — ABNORMAL LOW (ref 8.4–10.5)
Calcium: 8 mg/dL — ABNORMAL LOW (ref 8.4–10.5)
Calcium: 8.3 mg/dL — ABNORMAL LOW (ref 8.4–10.5)
Chloride: 103 mEq/L (ref 96–112)
Chloride: 105 mEq/L (ref 96–112)
Chloride: 103 mEq/L (ref 96–112)
Chloride: 104 mEq/L (ref 96–112)
Creatinine, Ser: 0.4 mg/dL — ABNORMAL LOW (ref 0.50–1.10)
Creatinine, Ser: 0.32 mg/dL — ABNORMAL LOW (ref 0.50–1.10)
Creatinine, Ser: 0.49 mg/dL — ABNORMAL LOW (ref 0.50–1.10)
Creatinine, Ser: 0.45 mg/dL — ABNORMAL LOW (ref 0.50–1.10)
GFR calc Af Amer: >90 mL/min (ref >90)
GFR calc Af Amer: >90 mL/min (ref >90)
GFR calc Af Amer: >90 mL/min (ref >90)
GFR calc Af Amer: >90 mL/min (ref >90)
GFR calc non Af Amer: >90 mL/min (ref >90)
GFR calc non Af Amer: >90 mL/min (ref >90)
GFR calc non Af Amer: >90 mL/min (ref >90)
GLUCOSE: 168 mg/dL — AB (ref 70–99)
Glucose, Bld: 124 mg/dL — ABNORMAL HIGH (ref 70–99)
Glucose, Bld: 158 mg/dL — ABNORMAL HIGH (ref 70–99)
Glucose, Bld: 302 mg/dL — ABNORMAL HIGH (ref 70–99)
POTASSIUM: 3.5 meq/L — AB (ref 3.7–5.3)
POTASSIUM: 4.1 meq/L (ref 3.7–5.3)
Potassium: 4 mEq/L (ref 3.7–5.3)
Potassium: 3.7 mEq/L (ref 3.7–5.3)
SODIUM: 139 meq/L (ref 137–147)
Sodium: 137 mEq/L (ref 137–147)
Sodium: 136 mEq/L — ABNORMAL LOW (ref 137–147)
Sodium: 142 mEq/L (ref 137–147)

## 2013-08-10 LAB — GLUCOSE, CAPILLARY
GLUCOSE-CAPILLARY: 131 mg/dL — AB (ref 70–99)
GLUCOSE-CAPILLARY: 253 mg/dL — AB (ref 70–99)
GLUCOSE-CAPILLARY: 319 mg/dL — AB (ref 70–99)
GLUCOSE-CAPILLARY: 185 mg/dL — AB (ref 70–99)
GLUCOSE-CAPILLARY: 152 mg/dL — AB (ref 70–99)
GLUCOSE-CAPILLARY: 218 mg/dL — AB (ref 70–99)
Glucose-Capillary: 196 mg/dL — ABNORMAL HIGH (ref 70–99)
Glucose-Capillary: 165 mg/dL — ABNORMAL HIGH (ref 70–99)
Glucose-Capillary: 157 mg/dL — ABNORMAL HIGH (ref 70–99)
Glucose-Capillary: 163 mg/dL — ABNORMAL HIGH (ref 70–99)
Glucose-Capillary: 158 mg/dL — ABNORMAL HIGH (ref 70–99)
Glucose-Capillary: 136 mg/dL — ABNORMAL HIGH (ref 70–99)
Glucose-Capillary: 169 mg/dL — ABNORMAL HIGH (ref 70–99)
Glucose-Capillary: 240 mg/dL — ABNORMAL HIGH (ref 70–99)
Glucose-Capillary: 161 mg/dL — ABNORMAL HIGH (ref 70–99)

## 2013-08-10 LAB — CBC
HCT: 33.8 % — ABNORMAL LOW (ref 36.0–46.0)
HEMOGLOBIN: 12.3 g/dL (ref 12.0–15.0)
MCH: 28.9 pg (ref 26.0–34.0)
MCHC: 36.4 g/dL — ABNORMAL HIGH (ref 30.0–36.0)
MCV: 79.5 fL (ref 78.0–100.0)
Platelets: UNDETERMINED 10*3/uL (ref 150–400)
RBC: 4.25 MIL/uL (ref 3.87–5.11)
RDW: 13.7 % (ref 11.5–15.5)
WBC: 9 10*3/uL (ref 4.0–10.5)

## 2013-08-10 MED ORDER — INSULIN GLARGINE 100 UNIT/ML ~~LOC~~ SOLN
20.0000 [IU] | Freq: Every morning | SUBCUTANEOUS | Status: DC
  Administered 2013-08-11: 20 [IU] via SUBCUTANEOUS
  Filled 2013-08-10: qty 0.2

## 2013-08-10 MED ORDER — INSULIN ASPART 100 UNIT/ML ~~LOC~~ SOLN
6.0000 [IU] | Freq: Three times a day (TID) | SUBCUTANEOUS | Status: DC
  Administered 2013-08-10 – 2013-08-11 (×3): 6 [IU] via SUBCUTANEOUS

## 2013-08-10 MED ORDER — INSULIN ASPART PROT & ASPART (70-30 MIX) 100 UNIT/ML ~~LOC~~ SUSP
15.0000 [IU] | Freq: Two times a day (BID) | SUBCUTANEOUS | Status: DC
Start: 2013-08-10 — End: 2013-08-10
  Administered 2013-08-10: 15 [IU] via SUBCUTANEOUS
  Filled 2013-08-10: qty 10

## 2013-08-10 MED ORDER — INSULIN ASPART PROT & ASPART (70-30 MIX) 100 UNIT/ML ~~LOC~~ SUSP: 15.0000 [IU] | Freq: Two times a day (BID) | SUBCUTANEOUS | Status: DC

## 2013-08-10 MED ORDER — SODIUM CHLORIDE 0.45 % IV SOLN
INTRAVENOUS | Status: DC
  Administered 2013-08-10: 21:00:00 via INTRAVENOUS
  Administered 2013-08-10: 100 mL/h via INTRAVENOUS

## 2013-08-10 NOTE — Progress Notes (Signed)
Subjective: Patient is feeling well this morning. She states that she's ready to go home and back to work once we decide that she's clinically stable. She no longer has shortness of breath, nausea, or malaise. Her glucose levels have stabilized. She has no complaints this morning and denies headaches, chest pain or abdominal pain.  Objective: Vital signs in last 24 hours: Filed Vitals:   08/09/13 1935 08/09/13 2350 08/10/13 0315 08/10/13 0747  BP: 130/83 111/71 100/57 108/71  Pulse: 97 104 96   Temp: 98.4 F (36.9 C) 98.5 F (36.9 C) 98.2 F (36.8 C) 97.9 F (36.6 C)  TempSrc: Oral Oral Oral Oral  Resp: 16 29 11 17   SpO2: 100% 99% 99%    Weight change:   Intake/Output Summary (Last 24 hours) at 08/10/13 0813 Last data filed at 08/10/13 0600  Gross per 24 hour  Intake   3360 ml  Output      0 ml  Net   3360 ml    Physical Exam:  General appearance: Alert and oriented x3. Patient is lying comfortably and in good spirits.  HEENT: Pupils are equal, round and reactive. Extra occular movements are intact. Conjunctivae clear. Mucous membranes are moist.  Cardiovascular: RRR, normal S1S2, no murmurs/rubs/gallops appreciated.  Pulmonary: Normal work of breathing. Clear to auscultation bilaterally.  Abdomen: Normoactive bowel sounds. Soft, non-tender/non-distended. No organomegaly.  Extremities: Warm and well-perfused. No peripheral edema noted. Pulses 2+ bilaterally.  Skin: Skin color, texture, turgor normal. No rashes or lesions.   Lab Results: BMET    Component Value Date/Time   NA 136* 08/10/2013 0401   K 3.7 08/10/2013 0401   CL 105 08/10/2013 0401   CO2 15* 08/10/2013 0401   GLUCOSE 158* 08/10/2013 0401   BUN 4* 08/10/2013 0401   CREATININE 0.32* 08/10/2013 0401   CREATININE 0.59 11/06/2011 1556   CALCIUM 8.3* 08/10/2013 0401   GFRNONAA >90 08/10/2013 0401   GFRNONAA >89 11/06/2011 1556   GFRAA >90 08/10/2013 0401   GFRAA >89 11/06/2011 1556   CBC    Component Value Date/Time     WBC 9.0 08/10/2013 0401   RBC 4.25 08/10/2013 0401   HGB 12.3 08/10/2013 0401   HCT 33.8* 08/10/2013 0401   PLT PLATELET CLUMPS NOTED ON SMEAR, UNABLE TO ESTIMATE 08/10/2013 0401   MCV 79.5 08/10/2013 0401   MCH 28.9 08/10/2013 0401   MCHC 36.4* 08/10/2013 0401   RDW 13.7 08/10/2013 0401   LYMPHSABS 2.4 07/05/2013 1218   MONOABS 0.5 07/05/2013 1218   EOSABS 0.2 07/05/2013 1218   BASOSABS 0.0 07/05/2013 1218   CBG (last 3)   Recent Labs  08/10/13 0511 08/10/13 0614 08/10/13 0720  GLUCAP 131* 136* 169*    Anion Gap (Last 2): 16 (4/13 - 04:01) and 18 (4/12 - 19:15)   Micro Results: Recent Results (from the past 240 hour(s))  URINE CULTURE     Status: None   Collection Time    08/07/13  3:18 PM      Result Value Ref Range Status   Specimen Description URINE, CLEAN CATCH   Final   Special Requests NONE   Final   Culture  Setup Time     Final   Value: 08/07/2013 16:30     Performed at [facility] Count     Final   Value: 30,000 COLONIES/ML     Performed at [facility]   Culture     Final   Value: YEAST  Performed at [facility]   Report Status 08/08/2013 FINAL   Final  MRSA PCR SCREENING     Status: None   Collection Time    08/07/13  6:11 PM      Result Value Ref Range Status   MRSA by PCR NEGATIVE  NEGATIVE Final   Comment:            The GeneXpert MRSA Assay (FDA     approved for NASAL specimens     only), is one component of a     comprehensive MRSA colonization     surveillance program. It is not     intended to diagnose MRSA     infection nor to guide or     monitor treatment for     MRSA infections.  CULTURE, BLOOD (ROUTINE X 2)     Status: None   Collection Time    08/07/13  7:59 PM      Result Value Ref Range Status   Specimen Description BLOOD LEFT ARM   Final   Special Requests BOTTLES DRAWN AEROBIC ONLY 10CC   Final   Culture  Setup Time     Final   Value: 08/08/2013 01:01     Performed at [facility]    Culture     Final   Value:        BLOOD CULTURE RECEIVED NO GROWTH TO DATE CULTURE WILL BE HELD FOR 5 DAYS BEFORE ISSUING A FINAL NEGATIVE REPORT     Performed at [facility]   Report Status PENDING   Incomplete  CULTURE, BLOOD (ROUTINE X 2)     Status: None   Collection Time    08/07/13  8:32 PM      Result Value Ref Range Status   Specimen Description BLOOD RIGHT HAND   Final   Special Requests BOTTLES DRAWN AEROBIC ONLY 2CC   Final   Culture  Setup Time     Final   Value: 08/08/2013 01:01     Performed at [facility]   Culture     Final   Value:        BLOOD CULTURE RECEIVED NO GROWTH TO DATE CULTURE WILL BE HELD FOR 5 DAYS BEFORE ISSUING A FINAL NEGATIVE REPORT     Performed at [facility]   Report Status PENDING   Incomplete    Medications: I have reviewed the patient's current medications. Scheduled Meds: . heparin  5,000 Units Subcutaneous 3 times per day  . insulin regular  0-10 Units Intravenous TID WC   Continuous Infusions: . dextrose 5 % and 0.9% NaCl 150 mL/hr at 08/10/13 0719  . insulin (NOVOLIN-R) infusion Stopped (08/10/13 0616)   PRN Meds:.dextrose  Assessment/Plan: #DKA - Patient meets criteria for DKA on admission with glucose of 773, pH of 7.20, Bicarb <7, with ketonuria and an AG of 35. She's had previous admissions of DKA in the past (March 8, January 17th) due to not taking (or running out of) her insulin, so medical non-compliance is the likely cause. We also pursued an infectious work-up (CXR, Blood & Urine Cxs negative) to rule out infection as the precipitating factor given her white count of 15.7 on admission. Troponins x3 negative to rule out unlikely MI given epigastric discomfort on presentation.  - Observing on tele  - DKA protocol (continue insulin gtt, IVFs)  - IVF: D5-1/2NS given BG < 250  - Monitor K and replete as necessary  - BMETs q2h  until AG closed x 2  - Start [place] insulin once AG closed x 2 (continue insulin drip  for 2 hours after [place] insulin started)  - Blood cultures x2 & Urine cultures negative - Consult to social work, diabetes educator    #DM1 (uncontrolled) - History of DM1 (diagnosed at age 23; A1c 17 in 04/2013). Past hospitalizations have been due to medication non-compliance. On Novolin 70/30 15 units BID. Need strict outpatient monitoring.  - On Insulin Gtt   This is a Careers information officer Note.  The care of the patient was discussed with Dr. Doug Sou and the assessment and plan formulated with their assistance.  Please see their attached note for official documentation of the daily encounter.   LOS: 3 days   Isac Sarna, Med Student 08/10/2013, 8:13 AM

## 2013-08-10 NOTE — Discharge Summary (Signed)
Name: Ashley Freeman MRN: [id number] DOB: 1991-02-01 23 y.o. PCP: Cresenciano Genre, MD  Date of Admission: 08/07/2013  1:23 PM Date of Discharge: 08/11/2013 Attending Physician: Axel Filler, MD  Discharge Diagnosis: Active Problems:   Diabetes mellitus type 1 (uncontrolled)   Hyperglycemia   DKA (diabetic ketoacidoses)  Discharge Medications:   Medication List    STOP taking these medications       insulin NPH-regular Human (70-30) 100 UNIT/ML injection  Commonly known as:  NOVOLIN 70/30      TAKE these medications       insulin aspart 100 UNIT/ML FlexPen  Commonly known as:  NOVOLOG  Inject 6 Units into the skin 3 (three) times daily with meals.     insulin glargine 100 unit/mL Sopn  Commonly known as:  LANTUS  Inject 0.2 mLs (20 Units total) into the skin every morning.     Insulin Pen Needle 32G X 4 MM Misc  For use with Lantus and Novolog insulin pens 4 times daily, in the morning and with meals        Disposition and follow-up:   Ashley Freeman was discharged from [facility] in Stable condition.  At the hospital follow up visit please address:  1.  Insulin compliance. CBG control. Living situation.  2.  Labs / imaging needed at time of follow-up: BMP  3.  Pending labs/ test needing follow-up: Blood cultures (NGTD)  Follow-up Appointments: Follow-up Information   Follow up with Cresenciano Genre, MD On 08/24/2013. ('@8' :45am. This is your primary care clinic.)    Specialty:  Internal Medicine   Contact information:   [address] [place] [id number] [phone]       Follow up with Plyler, Butch Penny, RD On 08/24/2013. ('@9' :30am. This is your diabetes educator.)    Specialty:  Financial controller information:   [place] [id number] [phone]       Discharge Instructions: Discharge Orders   Future Appointments Provider Department Dept Phone   08/24/2013 8:45 AM Cresenciano Genre, MD [place] [phone]   08/24/2013 9:30 AM Resa Miner, [name] [facility] [place] [phone]   Future Orders Complete By Expires   Call MD for:  persistant dizziness or light-headedness  As directed    Call MD for:  temperature >100.4  As directed    Diet Carb Modified  As directed    Increase activity slowly  As directed       Consultations:  None  Procedures Performed:  Dg Chest Port 1 View  08/07/2013   CLINICAL DATA:  Shortness of breath, respiratory difficulty.  EXAM: PORTABLE CHEST - 1 VIEW  COMPARISON:  DG CHEST 1V PORT dated 10/26/2012  FINDINGS: The heart size and mediastinal contours are within normal limits. Both lungs are clear. The visualized skeletal structures are unremarkable. Gas distended stomach.  IMPRESSION: No acute cardiopulmonary process.   Electronically Signed   By: Elon Alas   On: 08/07/2013 20:54    Admission HPI:  Ashley Freeman is a 23 year-old woman with uncontrolled DM1 (diagnosed at age 21; A1c 17 in 04/2013) who presents with shortness of breath, nausea, and malaise.   She reports that 1 day prior to admission she began feeling short of breath and increasingly thirsty and fatigued. She states that her outpatient insulin regimen is currently Novolin 70/30 15 units BID and she's been taking it as scheduled, but  she "doesn't feel like it's working anymore" and is causing her symptoms. She presented in January with a similar presentation due to not being able to afford her medication. She denies AMS, fevers/chills, N/V, chest pain, abdominal pain, or leg swelling.   In the ED labs were consistent with DKA; Glucose - 687, HCO3 - <7, anion gap of 35, venous pH 7.20, lactic acid within normal limits, UA positive for glucosuria and ketonuria. She was started on IV hydration, insulin, and provided zofran once for nausea.   Physical Exam:  Blood pressure 127/86, pulse 109, temperature 98.2 F (36.8 C), temperature source  Oral, resp. rate 25, SpO2 100.00%.  Physical Exam  Constitutional: She is oriented to person, place, and time. Vital signs are normal.  In mild distress  HENT:  Head: Normocephalic and atraumatic.  Eyes: Conjunctivae and EOM are normal. Pupils are equal, round, and reactive to light.  Neck: Normal range of motion. Neck supple.  Cardiovascular: Regular rhythm, normal heart sounds and intact distal pulses. Tachycardia present. Exam reveals no gallop and no friction rub.  No murmur heard.  Pulmonary/Chest: Breath sounds normal. She has no wheezes. She has no rales. She exhibits no tenderness.  Tachypnea to 25, she does endorse shortness of breath when moving from lying to sitting in bed  Abdominal: Soft. Bowel sounds are normal. She exhibits no distension and no mass. There is no tenderness. There is no rebound and no guarding.  Musculoskeletal: Normal range of motion. She exhibits no edema and no tenderness.  Neurological: She is alert and oriented to person, place, and time. No cranial nerve deficit. GCS score is 15.  Skin: Skin is warm and dry. No rash noted. No erythema. No pallor.  Psychiatric: Mood and affect normal.    Hospital Course by problem list: Ashley Freeman is a 23 year old with uncontrolled DM1 (A1C 17% in 04/2013) presenting in DKA.   1. DKA - Patient met criteria for DKA on admission with glucose of 773, pH of 7.20, Bicarb <7, with ketonuria and an AG of 35.  She's had previous admissions of DKA in the past (March 8, January 17th) due to not taking (or running out of) her insulin, so medical non-compliance is the likely cause.  We pursued an infectious work-up to rule out infectiongiven WBC of 15.7 on admission and mild tachycardia. CXR, blood cultures x2, and urine culture were all negative. Troponins x3 were also obtained to rule out MI (all normal).  Tachycardia resolved.  During hospitalization, she was observed on telemetry and received IV hydration, bicarb, insulin  gtt, and potassium as needed.  We closely monitored her anion gap and bicarb until they normalized. Once anion gap normalized, she was started on [place] insulin with Lantus 20 units qam and Novolog 6 units TID AC. Diabetes educator was consulted for further patient support. As the patient now has Medicaid, pens are an option for her and she told us she prefers insulin delivery this way. She was discharged with a prescription for Lantus Solostar pens and Novolog pens as well as pen needles. She was encouraged to check her blood sugars 4 times per day. She assured [place] she has a meter and supplies at home. She was told to bring this to her follow up appointment in the [facility] with Dr. Aundra Dubin and Butch Penny.  2. DM1 (uncontrolled) - A1C 19.3 here. History of DM1 (diagnosed at age 29; A1c 17 in 04/2013).  Past hospitalizations have been due to medication non-compliance. On  Novolin 70/30 15 units BID at home but felt this "was not working as well as the pens". Glucose levels stable at discharge. Patient needs strict outpatient monitoring. She was counseled very frankly about what the consequences of poor diabetes control will be for her going forward.  3. Leukocytosis - White count up to 23.3 this admission, likely reactive over infectious. Improved to 9.0 on HOD3. AVSS, patient afebrile, CXR normal, lungs are clear, urine culture with some yeast, no bacteria, blood cultures are NGTD. Will continue to monitor.   4. GERD: Provided PPI.   5. Social issues: On discharge patient revealed she was living in a house with 16 people. Social work was consulted to discuss this with her. She reassured the social worker she will be moving to a new apartment with her mother soon. Psychosocial issues are likely playing a large role in her noncompliance. She does have a job at [employer]. She has a support system in her mother.  6. 19 beat run of VT - Seen on telemetry overnight prior to discharge. K 4.2. Mag 1.9. Asymptomatic. We supplemented  magnesium 2g IV. 12-lead EKG showed no evidence of structural heart disease. She denies a family history of early death. Per UpToDate, no therapy needed.    Discharge Vitals:   BP 122/72  Pulse 95  Temp(Src) 98.6 F (37 C) (Oral)  Resp 19  SpO2 93%  LMP 07/17/2013  Discharge Labs:  Results for orders placed during the hospital encounter of 08/07/13 (from the past 24 hour(s))  GLUCOSE, CAPILLARY     Status: Abnormal   Collection Time    08/10/13 12:47 PM      Result Value Ref Range   Glucose-Capillary 185 (*) 70 - 99 mg/dL  BASIC METABOLIC PANEL     Status: Abnormal   Collection Time    08/10/13  3:55 PM      Result Value Ref Range   Sodium 142  137 - 147 mEq/L   Potassium 4.1  3.7 - 5.3 mEq/L   Chloride 104  96 - 112 mEq/L   CO2 23  19 - 32 mEq/L   Glucose, Bld 168 (*) 70 - 99 mg/dL   BUN 3 (*) 6 - 23 mg/dL   Creatinine, Ser 0.45 (*) 0.50 - 1.10 mg/dL   Calcium 8.3 (*) 8.4 - 10.5 mg/dL   GFR calc non Af Amer >90  >90 mL/min   GFR calc Af Amer >90  >90 mL/min  GLUCOSE, CAPILLARY     Status: Abnormal   Collection Time    08/10/13  4:39 PM      Result Value Ref Range   Glucose-Capillary 152 (*) 70 - 99 mg/dL   Comment 1 Notify RN     Comment 2 Documented in Chart    GLUCOSE, CAPILLARY     Status: Abnormal   Collection Time    08/10/13  9:24 PM      Result Value Ref Range   Glucose-Capillary 218 (*) 70 - 99 mg/dL  BASIC METABOLIC PANEL     Status: Abnormal   Collection Time    08/11/13  5:20 AM      Result Value Ref Range   Sodium 137  137 - 147 mEq/L   Potassium 4.2  3.7 - 5.3 mEq/L   Chloride 100  96 - 112 mEq/L   CO2 20  19 - 32 mEq/L   Glucose, Bld 443 (*) 70 - 99 mg/dL   BUN 9  6 - 23  mg/dL   Creatinine, Ser 0.40 (*) 0.50 - 1.10 mg/dL   Calcium 8.7  8.4 - 10.5 mg/dL   GFR calc non Af Amer >90  >90 mL/min   GFR calc Af Amer >90  >90 mL/min  MAGNESIUM     Status: None   Collection Time    08/11/13  5:20 AM      Result Value Ref Range   Magnesium 1.9  1.5  - 2.5 mg/dL  GLUCOSE, CAPILLARY     Status: Abnormal   Collection Time    08/11/13  7:48 AM      Result Value Ref Range   Glucose-Capillary 447 (*) 70 - 99 mg/dL   Comment 1 Documented in Chart     Comment 2 Notify RN      Signed: Lesly Dukes, MD 08/11/2013, 12:10 PM   Time Spent on Discharge: 35 minutes Services Ordered on Discharge: None Equipment Ordered on Discharge: None

## 2013-08-10 NOTE — Progress Notes (Signed)
  I have seen and examined the patient, and reviewed the daily progress note by Bess Harvest, MS 4 and discussed the care of the patient with them. Please see my progress note from 08/10/2013 for further details regarding assessment and plan.    Signed:  Lesly Dukes, MD 08/10/2013, 9:18 AM

## 2013-08-10 NOTE — Progress Notes (Signed)
Inpatient Diabetes Program Recommendations  AACE/ADA: New Consensus Statement on Inpatient Glycemic Control (2013)  Target Ranges:  Prepandial:   less than 140 mg/dL      Peak postprandial:   less than 180 mg/dL (1-2 hours)      Critically ill patients:  140 - 180 mg/dL   Reason for Visit: Hyperglycemia  Continue insulin drip until DKA criteria until acidosis is resolved (venous CO2 > 20, normal anion gap) and glucose levels within range for at least 4 consecutive hours.  Long discussion with pt regarding her diabetes. Pt states she does not like 70/30 insulin (although she said she did not miss any doses) because it doesn't control her blood sugars as well as Lantus and Novolog. States she has Medicaid now and does not have problems getting her insulin and supplies. Requests to be discharged on Lantus and Novolog pens. States she hasn't been checking blood sugars because "she's tired and doesn't have time to."   Recommend pt to be discharged on Lantus 20 units QAM and Novolog 6 units tidwc for meal coverage insulin, along with sensitive correction.  Check blood sugars at least 4 times/day and take logbook to PCP for adjustments. May benefit from psych consult for continued inability to cope with having Type 1 diabetes.  Thank you. Lorenda Peck, RD, LDN, CDE Inpatient Diabetes Coordinator [phone]

## 2013-08-10 NOTE — Progress Notes (Signed)
Subjective: Patient seen and examined at the bedside this morning. She feels well, back to baseline. Denies shortness of breath, chest pain, abdominal pain, nausea. We had a frank discussion about the problems uncontrolled diabetes will cause her in the future if she doesn't work to get it under control. She is amenable to following up with a PCP regularly.  She threatened to leave AMA over the weekend unless she could eat dinner, so a carb-modified diet was started early.  Objective: Vital signs in last 24 hours: Filed Vitals:   08/09/13 1935 08/09/13 2350 08/10/13 0315 08/10/13 0747  BP: 130/83 111/71 100/57 108/71  Pulse: 97 104 96   Temp: 98.4 F (36.9 C) 98.5 F (36.9 C) 98.2 F (36.8 C) 97.9 F (36.6 C)  TempSrc: Oral Oral Oral Oral  Resp: 16 29 11 17   SpO2: 100% 99% 99%    Weight change:   Intake/Output Summary (Last 24 hours) at 08/10/13 0915 Last data filed at 08/10/13 0600  Gross per 24 hour  Intake   3210 ml  Output      0 ml  Net   3210 ml   Vitals reviewed. General: Resting in bed, NAD HEENT: PERRL, EOMI Cardiac: RRR, no rubs, murmurs or gallops Pulm: Clear to auscultation bilaterally, no wheezes, rales, or rhonchi Abd: Soft, nontender, nondistended, BS present Ext: Warm and well perfused, no pedal edema Neuro: Alert and oriented X3, cranial nerves II-XII grossly intact, strength and sensation equal in bilateral upper and lower extremities  Lab Results: Basic Metabolic Panel:  Recent Labs Lab 08/07/13 0015  08/09/13 1915 08/10/13 0401  NA 143  < > 137 136*  K 3.7  < > 4.0 3.7  CL 110  < > 103 105  CO2 10*  < > 16* 15*  GLUCOSE 180*  < > 124* 158*  BUN 7  < > <3* 4*  CREATININE 0.47*  < > 0.40* 0.32*  CALCIUM 8.4  < > 8.6 8.3*  PHOS 1.1*  --   --   --   < > = values in this interval not displayed. Liver Function Tests:  Recent Labs Lab 08/07/13 1255  AST 19  ALT 12  ALKPHOS 111  BILITOT 0.2*  PROT 9.2*  ALBUMIN 4.5   CBC:  Recent  Labs Lab 08/09/13 0205 08/10/13 0401  WBC 13.4* 9.0  HGB 13.2 12.3  HCT 36.7 33.8*  MCV 81.0 79.5  PLT 319 PLATELET CLUMPS NOTED ON SMEAR, UNABLE TO ESTIMATE   Cardiac Enzymes:  Recent Labs Lab 08/07/13 0015 08/07/13 1959 08/08/13 0400  TROPONINI <0.30 <0.30 <0.30   CBG:  Recent Labs Lab 08/10/13 0211 08/10/13 0315 08/10/13 0402 08/10/13 0511 08/10/13 0614 08/10/13 0720  GLUCAP 157* 163* 158* 131* 136* 169*   Hemoglobin A1C:  Recent Labs Lab 08/07/13 1959  HGBA1C 19.3*   Urine Drug Screen: Drugs of Abuse     Component Value Date/Time   LABOPIA NONE DETECTED 08/09/2013 0005   LABOPIA NEGATIVE 12/18/2009 1453   COCAINSCRNUR NONE DETECTED 08/09/2013 0005   COCAINSCRNUR NEGATIVE 12/18/2009 1453   LABBENZ NONE DETECTED 08/09/2013 0005   LABBENZ NEGATIVE 12/18/2009 1453   AMPHETMU NONE DETECTED 08/09/2013 0005   AMPHETMU NEGATIVE 12/18/2009 1453   THCU NONE DETECTED 08/09/2013 0005   LABBARB NONE DETECTED 08/09/2013 0005   Urinalysis:  Recent Labs Lab 08/07/13 1518  COLORURINE YELLOW  LABSPEC 1.034*  PHURINE 5.0  GLUCOSEU >1000*  HGBUR NEGATIVE  BILIRUBINUR NEGATIVE  KETONESUR >80*  PROTEINUR  NEGATIVE  UROBILINOGEN 0.2  NITRITE NEGATIVE  LEUKOCYTESUR NEGATIVE    Micro Results: Recent Results (from the past 240 hour(s))  URINE CULTURE     Status: None   Collection Time    08/07/13  3:18 PM      Result Value Ref Range Status   Specimen Description URINE, CLEAN CATCH   Final   Special Requests NONE   Final   Culture  Setup Time     Final   Value: 08/07/2013 16:30     Performed at [facility]     Final   Value: 30,000 COLONIES/ML     Performed at [facility]   Culture     Final   Value: YEAST     Performed at [facility]   Report Status 08/08/2013 FINAL   Final  MRSA PCR SCREENING     Status: None   Collection Time    08/07/13  6:11 PM      Result Value Ref Range Status   MRSA by PCR NEGATIVE  NEGATIVE  Final   Comment:            The GeneXpert MRSA Assay (FDA     approved for NASAL specimens     only), is one component of a     comprehensive MRSA colonization     surveillance program. It is not     intended to diagnose MRSA     infection nor to guide or     monitor treatment for     MRSA infections.  CULTURE, BLOOD (ROUTINE X 2)     Status: None   Collection Time    08/07/13  7:59 PM      Result Value Ref Range Status   Specimen Description BLOOD LEFT ARM   Final   Special Requests BOTTLES DRAWN AEROBIC ONLY 10CC   Final   Culture  Setup Time     Final   Value: 08/08/2013 01:01     Performed at [facility]   Culture     Final   Value:        BLOOD CULTURE RECEIVED NO GROWTH TO DATE CULTURE WILL BE HELD FOR 5 DAYS BEFORE ISSUING A FINAL NEGATIVE REPORT     Performed at [facility]   Report Status PENDING   Incomplete  CULTURE, BLOOD (ROUTINE X 2)     Status: None   Collection Time    08/07/13  8:32 PM      Result Value Ref Range Status   Specimen Description BLOOD RIGHT HAND   Final   Special Requests BOTTLES DRAWN AEROBIC ONLY 2CC   Final   Culture  Setup Time     Final   Value: 08/08/2013 01:01     Performed at [facility]   Culture     Final   Value:        BLOOD CULTURE RECEIVED NO GROWTH TO DATE CULTURE WILL BE HELD FOR 5 DAYS BEFORE ISSUING A FINAL NEGATIVE REPORT     Performed at [facility]   Report Status PENDING   Incomplete   Studies/Results: No results found. Medications: I have reviewed the patient's current medications. Scheduled Meds: . heparin  5,000 Units Subcutaneous 3 times per day  . insulin regular  0-10 Units Intravenous TID WC   Continuous Infusions: . dextrose 5 % and 0.9% NaCl 150 mL/hr at 08/10/13 0719  . insulin (NOVOLIN-R) infusion 1.9  Units/hr (08/10/13 [id number])   PRN Meds:.dextrose  Assessment/Plan: Ashley Freeman is a 23 year old with uncontrolled DM1 (A1C 17% in 04/2013) presenting in DKA.    #DKA: Multiple admissions for DKA in the past 2/2 insulin noncompliance. A1C 19.3. Infectious work up negative. CXR unremarkable. VSS, afebrile without leukocytosis. Blood cultures 4/10 are NGTD. Troponins negative x3. UDS negative. CBGs have improved to wnl, anion gap improved to 16 this morning. We are close to converting her. Outpatient insulin regimen was 70/30 insulin 15 units BID (0.6 units/kg). - DKA protocol (continue insulin gtt, IVFs, NPO)  - IVF D5-0.5NS given BG < 250  - BMETs q4h until AG closed x 2  - Monitor K and replete as necessary  - Start [place] insulin once AG closed x 2  - Continue insulin drip for 2 hours after [place] insulin started  - Carb-modified diet (patient threatened to leave AMA unless we started diet), meal coverage with insulin bolus 0-10 units - Appreciate diabetes educator recs  Glucose-Capillary  Date Value Ref Range Status  08/10/2013 169* 70 - 99 mg/dL Final  08/10/2013 136* 70 - 99 mg/dL Final  08/10/2013 131* 70 - 99 mg/dL Final  08/10/2013 158* 70 - 99 mg/dL Final  08/10/2013 163* 70 - 99 mg/dL Final  08/10/2013 157* 70 - 99 mg/dL Final  08/10/2013 165* 70 - 99 mg/dL Final  08/09/2013 139* 70 - 99 mg/dL Final  08/09/2013 211* 70 - 99 mg/dL Final  08/09/2013 229* 70 - 99 mg/dL Final    #Leukocytosis: White count up to 23.3 this admission, likely reactive over infectious. Improved to 9.0 today. AVSS, patient afebrile, CXR normal, lungs are clear, urine culture with some yeast, no bacteria, blood cultures are NGTD. Will continue to monitor.  #GERD: PPI.   #DVT PPX: [place] heparin   Dispo: Disposition is deferred at this time, awaiting improvement of current medical problems.  Anticipated discharge in approximately 1-2 day(s).   The patient does have a current PCP Cresenciano Genre, MD) and does need an [facility] hospital follow-up appointment after discharge.  The patient does have transportation limitations that hinder transportation to clinic  appointments.  .Services Needed at time of discharge: Y = Yes, Blank = No PT:   OT:   RN:   Equipment:   Other:     LOS: 3 days   Lesly Dukes, MD 08/10/2013, 9:15 AM  Lesly Dukes, MD  [email or web address] Pager # [phone] After hours and weekends # [phone] Office # [phone]

## 2013-08-10 NOTE — Progress Notes (Signed)
Internal Medicine Attending  Date: 08/10/2013  Patient name: Ashley Freeman Medical record number: [id number] Date of birth: Mar 04, 1991 Age: 23 y.o. Gender: female  I saw and evaluated the patient. I reviewed the resident's note by Dr. Lucila Maine and I agree with the resident's findings and plans as documented in her note.

## 2013-08-10 NOTE — Clinical Documentation Improvement (Signed)
Possible Clinical Conditions?   hyponatremia       Other Condition  Supporting Information Na+: 134-136 4/12 to 4/13  Normal range: 137-147 Monitor/evaluate  Thank You, Joya Salm ,RN Clinical Documentation Specialist:  [place] [facility]

## 2013-08-11 LAB — BASIC METABOLIC PANEL
BUN: 9 mg/dL (ref 6–23)
CHLORIDE: 100 meq/L (ref 96–112)
CO2: 20 meq/L (ref 19–32)
CREATININE: 0.4 mg/dL — AB (ref 0.50–1.10)
Calcium: 8.7 mg/dL (ref 8.4–10.5)
GFR calc Af Amer: >90 mL/min (ref >90)
GFR calc non Af Amer: >90 mL/min (ref >90)
Glucose, Bld: 443 mg/dL — ABNORMAL HIGH (ref 70–99)
POTASSIUM: 4.2 meq/L (ref 3.7–5.3)
Sodium: 137 mEq/L (ref 137–147)

## 2013-08-11 LAB — GLUCOSE, CAPILLARY
Glucose-Capillary: 447 mg/dL — ABNORMAL HIGH (ref 70–99)
Glucose-Capillary: 310 mg/dL — ABNORMAL HIGH (ref 70–99)

## 2013-08-11 LAB — MAGNESIUM: Magnesium: 1.9 mg/dL (ref 1.5–2.5)

## 2013-08-11 MED ORDER — INSULIN GLARGINE 100 UNITS/ML SOLOSTAR PEN: 20.0000 [IU] | PEN_INJECTOR | Freq: Every morning | SUBCUTANEOUS | Status: DC

## 2013-08-11 MED ORDER — MAGNESIUM SULFATE 40 MG/ML IJ SOLN
2.0000 g | Freq: Once | INTRAMUSCULAR | Status: AC
  Administered 2013-08-11: 2 g via INTRAVENOUS
  Filled 2013-08-11: qty 50

## 2013-08-11 MED ORDER — INSULIN ASPART 100 UNIT/ML FLEXPEN: 6.0000 [IU] | PEN_INJECTOR | Freq: Three times a day (TID) | SUBCUTANEOUS | Status: DC

## 2013-08-11 MED ORDER — INSULIN PEN NEEDLE 32G X 4 MM MISC: Status: DC

## 2013-08-11 NOTE — Progress Notes (Addendum)
DC instructions given to patient and mother. Explained importance of checking BG and taking insulin. Rx called in by physician to pharmacy. Went over patient education, patient seemed uninterested in learning. No questions. VSS. All belongings sent home with patient and family. eICU and CCMT notified of DC. Work note sent home with patient. Insulin given to patient, pt will eat lunch then DC home with mother by bus. PIV DC by Everlena Cooper, RN. Guest services escorted pt by wheelchair to main entrance, mother at bedside.

## 2013-08-11 NOTE — Progress Notes (Signed)
  I have seen and examined the patient, and reviewed the daily progress note by Bess Harvest, MS 4 and discussed the care of the patient with them. Please see my progress note from 08/11/2013 for further details regarding assessment and plan.    Signed:  Lesly Dukes, MD 08/11/2013, 9:17 AM

## 2013-08-11 NOTE — Progress Notes (Signed)
Subjective: Patient continues to feel well this morning.  She has no complaints and denies headaches, chest pain, shortness of breath, or abdominal pain.  Per nursing, she did have a 19 beat run of VT overnight displayed on her telemetry but was asymptomatic and it remitted spontaneously.    She also spoke with a diabetes educator yesterday to further stress the importance of her insulin treatments and diabetes management.  She would prefer to use insulin pens on discharge, and we stressed the importance of regular follow-up with her PCP and strict maintenance of her glucose levels.    Objective: Vital signs in last 24 hours: Filed Vitals:   08/10/13 1920 08/11/13 0005 08/11/13 0350 08/11/13 0700  BP: 114/78 134/83 126/77   Pulse: 108 98 94   Temp: 98.4 F (36.9 C) 98.2 F (36.8 C) 98.6 F (37 C) 98.5 F (36.9 C)  TempSrc: Oral Oral Oral Oral  Resp: 24 18 20    SpO2: 94% 99% 93%    Weight change:   Intake/Output Summary (Last 24 hours) at 08/11/13 [id number] Last data filed at 08/11/13 0600  Gross per 24 hour  Intake 5315.8 ml  Output      0 ml  Net 5315.8 ml    Physical Exam:  General appearance: Alert and oriented x3. Patient is lying comfortably and in good spirits.  HEENT: Pupils are equal, round and reactive. Extra occular movements are intact. Conjunctivae clear. Mucous membranes are moist.  Cardiovascular: RRR, normal S1S2, no murmurs/rubs/gallops appreciated.  Pulmonary: Normal work of breathing. Clear to auscultation bilaterally.  Abdomen: Normoactive bowel sounds. Soft, non-tender/non-distended. No organomegaly.  Extremities: Warm and well-perfused. No peripheral edema noted. Pulses 2+ bilaterally.  Skin: Skin color, texture, turgor normal. No rashes or lesions.   Lab Results: BMET    Component Value Date/Time   NA 137 08/11/2013 0520   K 4.2 08/11/2013 0520   CL 100 08/11/2013 0520   CO2 20 08/11/2013 0520   GLUCOSE 443* 08/11/2013 0520   BUN 9 08/11/2013 0520   CREATININE 0.40* 08/11/2013 0520   CREATININE 0.59 11/06/2011 1556   CALCIUM 8.7 08/11/2013 0520   GFRNONAA >90 08/11/2013 0520   GFRNONAA >89 11/06/2011 1556   GFRAA >90 08/11/2013 0520   GFRAA >89 11/06/2011 1556   CBG (last 3)   Recent Labs  08/10/13 1639 08/10/13 2124 08/11/13 0748  GLUCAP 152* 218* 447*    Anion Gap (Last 2): 17 (4/14 - 05:20) and 15 (4/13 - 15:55)   Micro Results: Recent Results (from the past 240 hour(s))  URINE CULTURE     Status: None   Collection Time    08/07/13  3:18 PM      Result Value Ref Range Status   Specimen Description URINE, CLEAN CATCH   Final   Special Requests NONE   Final   Culture  Setup Time     Final   Value: 08/07/2013 16:30     Performed at [facility]     Final   Value: 30,000 COLONIES/ML     Performed at [facility]   Culture     Final   Value: YEAST     Performed at [facility]   Report Status 08/08/2013 FINAL   Final  MRSA PCR SCREENING     Status: None   Collection Time    08/07/13  6:11 PM      Result Value Ref Range Status   MRSA by PCR NEGATIVE  NEGATIVE Final   Comment:            The GeneXpert MRSA Assay (FDA     approved for NASAL specimens     only), is one component of a     comprehensive MRSA colonization     surveillance program. It is not     intended to diagnose MRSA     infection nor to guide or     monitor treatment for     MRSA infections.  CULTURE, BLOOD (ROUTINE X 2)     Status: None   Collection Time    08/07/13  7:59 PM      Result Value Ref Range Status   Specimen Description BLOOD LEFT ARM   Final   Special Requests BOTTLES DRAWN AEROBIC ONLY 10CC   Final   Culture  Setup Time     Final   Value: 08/08/2013 01:01     Performed at [facility]   Culture     Final   Value:        BLOOD CULTURE RECEIVED NO GROWTH TO DATE CULTURE WILL BE HELD FOR 5 DAYS BEFORE ISSUING A FINAL NEGATIVE REPORT     Performed at [facility]   Report Status  PENDING   Incomplete  CULTURE, BLOOD (ROUTINE X 2)     Status: None   Collection Time    08/07/13  8:32 PM      Result Value Ref Range Status   Specimen Description BLOOD RIGHT HAND   Final   Special Requests BOTTLES DRAWN AEROBIC ONLY 2CC   Final   Culture  Setup Time     Final   Value: 08/08/2013 01:01     Performed at [facility]   Culture     Final   Value:        BLOOD CULTURE RECEIVED NO GROWTH TO DATE CULTURE WILL BE HELD FOR 5 DAYS BEFORE ISSUING A FINAL NEGATIVE REPORT     Performed at [facility]   Report Status PENDING   Incomplete    Medications: I have reviewed the patient's current medications. Scheduled Meds: . heparin  5,000 Units Subcutaneous 3 times per day  . insulin aspart  6 Units Subcutaneous TID WC  . insulin glargine  20 Units Subcutaneous q morning - 10a  . magnesium sulfate 1 - 4 g bolus IVPB  2 g Intravenous Once   Continuous Infusions: . sodium chloride 100 mL/hr at 08/10/13 2123   PRN Meds:.dextrose  Assessment/Plan: #DKA - Patient meets criteria for DKA on admission with glucose of 773, pH of 7.20, Bicarb <7, with ketonuria and an AG of 35. She's had previous admissions of DKA in the past (March 8, January 17th) due to not taking (or running out of) her insulin, so medical non-compliance is the likely cause. We pursued an infectious work-up to rule out infection as the cause (given WBC of 15.7 on admission) and CXR, blood cultures x2, and urine culture were all negative. Troponins x3 were also obtained to rule out MI (all normal).  During hospitalization, she was observed on telemetry and received IV hydration, bicarb, insulin gtt, and potassium as needed.  We closely monitored her anion gap and bicarb until they normalized. Once anion gap normalized, she was started on [place] insulin.  Social work and a diabetes educator were consulted for further patient support and recommendations on future diabetes management. - DKA protocol - IVF: 1/2NS  @ 100cc/her - Lantus  20 units qam, Novolog 6 units TID w/meals - Monitor K and replete as necessary  - BMETs q12h - Diabetes educator: Thank you for recommendations  #DM1 (uncontrolled) - History of DM1 (diagnosed at age 23; A1c 17 in 04/2013). Past hospitalizations have been due to medication non-compliance. On Novolin 70/30 15 units BID at home. Need strict outpatient monitoring. At discharge, she will likely go home with Novolog and Lantus pens with close follow-up with her PCP.   #V.Tach - 19 beat run of V.Tach on telemetry (4/14), patient remained asymptomatic and it resolved spontaneously. K 4.2, Mag 1.9. Will continue to monitor. - Supplementing magnesium 2g IV  - Repeat BMP as outpatient  Dispo: Anticipated discharge in approximately 0-1 day(s).   The patient does have a current PCP Cresenciano Genre, MD) and does need an [facility] hospital follow-up appointment after discharge.   The patient does have transportation limitations that hinder transportation to clinic appointments.   This is a Careers information officer Note.  The care of the patient was discussed with Dr. Lucila Maine and the assessment and plan formulated with their assistance.  Please see their attached note for official documentation of the daily encounter.   LOS: 4 days   Isac Sarna, Med Student 08/11/2013, 8:32 AM

## 2013-08-11 NOTE — Clinical Social Work Note (Signed)
CSW met with patient at bedside to discuss "living situation" as treatment team seems to be concerned about this. CSW inquired about patient's living situation and if she would have a safe place to return at discharge today. Patient stated, "I'm not sure where all the confusion is coming from, I've told everyone that I have a place to go and that my mom and I will be moving into our own place on Friday." Patient states that she plans to return to current home today and states that she is safe there. CSW signing off at this time.  Liz Beach MSW, [place], [place], [phone]

## 2013-08-11 NOTE — Progress Notes (Addendum)
Subjective: Patient seen and examined at the bedside this morning. She feels back to normal. She denies chest pain or shortness of breath or palpitations or abdominal pain. She needs a work note. She would like to talk to a Education officer, museum about her living situation, apparently there are 16 people in the house and she may be moving out to live with friends. She knows how to use insulin pens and would like to be prescribed Lantus and Novolog pens upon discharge, she understands this means sticking herself 4 times per day. We discussed the importance of follow up in the clinic and she is amenable.  Objective: Vital signs in last 24 hours: Filed Vitals:   08/10/13 1920 08/11/13 0005 08/11/13 0350 08/11/13 0700  BP: 114/78 134/83 126/77   Pulse: 108 98 94   Temp: 98.4 F (36.9 C) 98.2 F (36.8 C) 98.6 F (37 C) 98.5 F (36.9 C)  TempSrc: Oral Oral Oral Oral  Resp: '24 18 20   ' SpO2: 94% 99% 93%    Weight change:   Intake/Output Summary (Last 24 hours) at 08/11/13 0748 Last data filed at 08/11/13 0600  Gross per 24 hour  Intake 5465.8 ml  Output      0 ml  Net 5465.8 ml   Vitals reviewed. General: Resting in bed, NAD HEENT: PERRL, EOMI Cardiac: RRR, no rubs, murmurs or gallops Pulm: Clear to auscultation bilaterally, no wheezes, rales, or rhonchi Abd: Soft, nontender, nondistended, BS present Ext: Warm and well perfused, no pedal edema Neuro: Alert and oriented X3, cranial nerves II-XII grossly intact, strength and sensation equal in bilateral upper and lower extremities  Lab Results: Basic Metabolic Panel:  Recent Labs Lab 08/07/13 0015  08/10/13 1555 08/11/13 0520  NA 143  < > 142 137  K 3.7  < > 4.1 4.2  CL 110  < > 104 100  CO2 10*  < > 23 20  GLUCOSE 180*  < > 168* 443*  BUN 7  < > 3* 9  CREATININE 0.47*  < > 0.45* 0.40*  CALCIUM 8.4  < > 8.3* 8.7  MG  --   --   --  1.9  PHOS 1.1*  --   --   --   < > = values in this interval not displayed. Liver Function  Tests:  Recent Labs Lab 08/07/13 1255  AST 19  ALT 12  ALKPHOS 111  BILITOT 0.2*  PROT 9.2*  ALBUMIN 4.5   CBC:  Recent Labs Lab 08/09/13 0205 08/10/13 0401  WBC 13.4* 9.0  HGB 13.2 12.3  HCT 36.7 33.8*  MCV 81.0 79.5  PLT 319 PLATELET CLUMPS NOTED ON SMEAR, UNABLE TO ESTIMATE   Cardiac Enzymes:  Recent Labs Lab 08/07/13 0015 08/07/13 1959 08/08/13 0400  TROPONINI <0.30 <0.30 <0.30   CBG:  Recent Labs Lab 08/10/13 0931 08/10/13 1034 08/10/13 1139 08/10/13 1247 08/10/13 1639 08/10/13 2124  GLUCAP 319* 240* 161* 185* 152* 218*   Hemoglobin A1C:  Recent Labs Lab 08/07/13 1959  HGBA1C 19.3*   Urine Drug Screen: Drugs of Abuse     Component Value Date/Time   LABOPIA NONE DETECTED 08/09/2013 0005   LABOPIA NEGATIVE 12/18/2009 1453   COCAINSCRNUR NONE DETECTED 08/09/2013 0005   COCAINSCRNUR NEGATIVE 12/18/2009 1453   LABBENZ NONE DETECTED 08/09/2013 0005   LABBENZ NEGATIVE 12/18/2009 1453   AMPHETMU NONE DETECTED 08/09/2013 0005   AMPHETMU NEGATIVE 12/18/2009 1453   THCU NONE DETECTED 08/09/2013 0005   LABBARB NONE DETECTED  08/09/2013 0005   Urinalysis:  Recent Labs Lab 08/07/13 1518  COLORURINE YELLOW  LABSPEC 1.034*  PHURINE 5.0  GLUCOSEU >1000*  HGBUR NEGATIVE  BILIRUBINUR NEGATIVE  KETONESUR >80*  PROTEINUR NEGATIVE  UROBILINOGEN 0.2  NITRITE NEGATIVE  LEUKOCYTESUR NEGATIVE    Micro Results: Recent Results (from the past 240 hour(s))  URINE CULTURE     Status: None   Collection Time    08/07/13  3:18 PM      Result Value Ref Range Status   Specimen Description URINE, CLEAN CATCH   Final   Special Requests NONE   Final   Culture  Setup Time     Final   Value: 08/07/2013 16:30     Performed at [facility]     Final   Value: 30,000 COLONIES/ML     Performed at [facility]   Culture     Final   Value: YEAST     Performed at [facility]   Report Status 08/08/2013 FINAL   Final  MRSA PCR  SCREENING     Status: None   Collection Time    08/07/13  6:11 PM      Result Value Ref Range Status   MRSA by PCR NEGATIVE  NEGATIVE Final   Comment:            The GeneXpert MRSA Assay (FDA     approved for NASAL specimens     only), is one component of a     comprehensive MRSA colonization     surveillance program. It is not     intended to diagnose MRSA     infection nor to guide or     monitor treatment for     MRSA infections.  CULTURE, BLOOD (ROUTINE X 2)     Status: None   Collection Time    08/07/13  7:59 PM      Result Value Ref Range Status   Specimen Description BLOOD LEFT ARM   Final   Special Requests BOTTLES DRAWN AEROBIC ONLY 10CC   Final   Culture  Setup Time     Final   Value: 08/08/2013 01:01     Performed at [facility]   Culture     Final   Value:        BLOOD CULTURE RECEIVED NO GROWTH TO DATE CULTURE WILL BE HELD FOR 5 DAYS BEFORE ISSUING A FINAL NEGATIVE REPORT     Performed at [facility]   Report Status PENDING   Incomplete  CULTURE, BLOOD (ROUTINE X 2)     Status: None   Collection Time    08/07/13  8:32 PM      Result Value Ref Range Status   Specimen Description BLOOD RIGHT HAND   Final   Special Requests BOTTLES DRAWN AEROBIC ONLY 2CC   Final   Culture  Setup Time     Final   Value: 08/08/2013 01:01     Performed at [facility]   Culture     Final   Value:        BLOOD CULTURE RECEIVED NO GROWTH TO DATE CULTURE WILL BE HELD FOR 5 DAYS BEFORE ISSUING A FINAL NEGATIVE REPORT     Performed at [facility]   Report Status PENDING   Incomplete   Studies/Results: No results found. Medications: I have reviewed the patient's current medications. Scheduled Meds: . heparin  5,000 Units Subcutaneous 3 times  per day  . insulin aspart  6 Units Subcutaneous TID WC  . insulin glargine  20 Units Subcutaneous q morning - 10a   Continuous Infusions: . sodium chloride 100 mL/hr at 08/10/13 2123   PRN  Meds:.dextrose  Assessment/Plan: ZANAI MALLARI is a 23 year old with uncontrolled DM1 (A1C 17% in 04/2013) presenting in DKA.   #DKA: Multiple admissions for DKA in the past 2/2 insulin noncompliance. A1C 19.3. Infectious work up negative. CXR unremarkable. VSS, afebrile without leukocytosis. Blood cultures 4/10 are NGTD. Troponins negative x3. UDS negative. CBGs have improved, bicarb up to 20. She was converted to subq insulin yesterday. Outpatient regimen was 70/30 insulin 15 units BID (0.6 units/kg). Diabetes coordinator met with her yesterday, and she now has Medicaid and would prefer Lantus and Novolog pens, will order these on discharge. - DKA protocol - IVF 1/2NS '@100cc' /hr - BMETs q12h - Monitor K and replete as necessary  - Lantus 20 units subq qam - Novolog 6 units TID AC - Carb-modified diet - Appreciate diabetes educator recs - Medically stable for discharge home with close PCP and diabetes coordinator follow up  Glucose-Capillary  Date Value Ref Range Status  08/10/2013 218* 70 - 99 mg/dL Final  08/10/2013 152* 70 - 99 mg/dL Final  08/10/2013 185* 70 - 99 mg/dL Final  08/10/2013 161* 70 - 99 mg/dL Final  08/10/2013 240* 70 - 99 mg/dL Final  08/10/2013 319* 70 - 99 mg/dL Final  08/10/2013 253* 70 - 99 mg/dL Final  08/10/2013 169* 70 - 99 mg/dL Final  08/10/2013 136* 70 - 99 mg/dL Final  08/10/2013 131* 70 - 99 mg/dL Final    #Leukocytosis: White count up to 23.3 this admission, likely reactive over infectious. Improved to 9.0 today. AVSS, patient afebrile, CXR normal, lungs are clear, urine culture with some yeast, no bacteria, blood cultures are NGTD. Will continue to monitor.  #19 beat run of VT - Reviewed on telemetry. K 4.2. Mag 1.9. Asymptomatic. - Supplementing magnesium 2g IV - 12-lead EKG - Repeat BMP as outpatient  #Social issues - Patient has multiple admissions for insulin noncompliance and tells me she lives in a house with 16 people.  - Care management  consult for medication needs (make sure Medicaid coverage is official) - Social work consult to discuss her living situation - Will provide a work note for her job at [employer]  #GERD: PPI.   #DVT PPX: [place] heparin.   Dispo: Anticipated discharge in approximately 0-1 day(s).   The patient does have a current PCP Cresenciano Genre, MD) and does need an [facility] hospital follow-up appointment after discharge.  The patient does have transportation limitations that hinder transportation to clinic appointments.  .Services Needed at time of discharge: Y = Yes, Blank = No PT:   OT:   RN:   Equipment:   Other:     LOS: 4 days   Lesly Dukes, MD 08/11/2013, 7:48 AM  Lesly Dukes, MD  [email or web address] Pager # [phone] After hours and weekends # [phone] Office # [phone]

## 2013-08-11 NOTE — Progress Notes (Signed)
Physician notified: Cater At: [id number]  Regarding: Does pt need Rx for insulin pens?  Awaiting return response.   Returned Response at: 1155  Order(s): Send electronically to pharmacy

## 2013-08-11 NOTE — Progress Notes (Signed)
Patient had 19 beat run of VT. Patient without c/o CP or SOB. Dr. Denton Brick notified. Cardiac strip printed and placed in chart. Will continue to monitor.

## 2013-08-11 NOTE — Discharge Instructions (Signed)
It was a pleasure taking care of you. - Please follow up with Dr. Aundra Dubin and Butch Penny (our diabetes educator) as above. They can support you and help you with your diabetes. - We have provided you with a work note. - Please take your insulin as prescribed. We have given you Lantus and Novolog pens as you requested. If you have trouble affording these, please call the clinic at [phone] immediately so we can change the prescription. We are here to help you. - Please check your blood sugars 4 times a day, before meals and before bedtime. Please bring your meter to your hospital follow up appointment. - If you develop fever, chills, shortness of breath, abdominal pain, confusion, please call the clinic or return to the ED.

## 2013-08-11 NOTE — Progress Notes (Addendum)
   CARE MANAGEMENT NOTE 08/11/2013  Patient:  Ashley Freeman, Ashley Freeman   Account Number:  [id number]  Date Initiated:  08/10/2013  Documentation initiated by:  [facility]  Subjective/Objective Assessment:   DKA     Action/Plan:   lives at home with mother   Anticipated DC Date:  08/11/2013   Anticipated DC Plan:  HOME/SELF CARE  In-house referral  Clinical Social Worker      DC Planning Services  CM consult      Choice offered to / List presented to:             Status of service:  Completed, signed off Medicare Important Message given?   (If response is "NO", the following Medicare IM given date fields will be blank) Date Medicare IM given:   Date Additional Medicare IM given:    Discharge Disposition:  HOME/SELF CARE  Per UR Regulation:    If discussed at Long Length of Stay Meetings, dates discussed:    Comments:  08/11/2013 0940 NCM spoke to pt and she does have Medicaid to cover her medications. NCM explained the importance of taking medications as prescribed. Pt states she is having issues with living arrangements. CSW consult.  Jonnie Finner RN CCM Case Mgmt phone [phone]

## 2013-08-12 ENCOUNTER — Encounter: Payer: Self-pay | Admitting: Internal Medicine

## 2013-08-12 DIAGNOSIS — Z9114 Patient's other noncompliance with medication regimen: Secondary | ICD-10-CM | POA: Insufficient documentation

## 2013-08-14 LAB — CULTURE, BLOOD (ROUTINE X 2)
Culture: NO GROWTH
Culture: NO GROWTH

## 2013-08-16 ENCOUNTER — Encounter (HOSPITAL_COMMUNITY): Payer: Self-pay | Admitting: Emergency Medicine

## 2013-08-16 ENCOUNTER — Emergency Department (HOSPITAL_COMMUNITY): Payer: Medicaid Other

## 2013-08-16 ENCOUNTER — Inpatient Hospital Stay (HOSPITAL_COMMUNITY)
Admission: EM | Admit: 2013-08-16 | Discharge: 2013-08-21 | DRG: 871 | Disposition: A | Discharge status: Home Health | Payer: Medicaid Other | Attending: Internal Medicine | Admitting: Internal Medicine

## 2013-08-16 DIAGNOSIS — A419 Sepsis, unspecified organism: Principal | ICD-10-CM | POA: Diagnosis present

## 2013-08-16 DIAGNOSIS — F4320 Adjustment disorder, unspecified: Secondary | ICD-10-CM

## 2013-08-16 DIAGNOSIS — E1010 Type 1 diabetes mellitus with ketoacidosis without coma: Secondary | ICD-10-CM | POA: Diagnosis present

## 2013-08-16 DIAGNOSIS — E1065 Type 1 diabetes mellitus with hyperglycemia: Secondary | ICD-10-CM | POA: Diagnosis present

## 2013-08-16 DIAGNOSIS — E1110 Type 2 diabetes mellitus with ketoacidosis without coma: Secondary | ICD-10-CM

## 2013-08-16 DIAGNOSIS — R739 Hyperglycemia, unspecified: Secondary | ICD-10-CM | POA: Diagnosis present

## 2013-08-16 DIAGNOSIS — R7989 Other specified abnormal findings of blood chemistry: Secondary | ICD-10-CM | POA: Diagnosis present

## 2013-08-16 DIAGNOSIS — Z5987 Material hardship due to limited financial resources, not elsewhere classified: Secondary | ICD-10-CM

## 2013-08-16 DIAGNOSIS — L03317 Cellulitis of buttock: Secondary | ICD-10-CM

## 2013-08-16 DIAGNOSIS — D649 Anemia, unspecified: Secondary | ICD-10-CM | POA: Diagnosis present

## 2013-08-16 DIAGNOSIS — Z91199 Patient's noncompliance with other medical treatment and regimen due to unspecified reason: Secondary | ICD-10-CM

## 2013-08-16 DIAGNOSIS — Z91148 Patient's other noncompliance with medication regimen for other reason: Secondary | ICD-10-CM

## 2013-08-16 DIAGNOSIS — Z8349 Family history of other endocrine, nutritional and metabolic diseases: Secondary | ICD-10-CM

## 2013-08-16 DIAGNOSIS — E108 Type 1 diabetes mellitus with unspecified complications: Secondary | ICD-10-CM

## 2013-08-16 DIAGNOSIS — Z880 Allergy status to penicillin: Secondary | ICD-10-CM

## 2013-08-16 DIAGNOSIS — E109 Type 1 diabetes mellitus without complications: Secondary | ICD-10-CM

## 2013-08-16 DIAGNOSIS — Z9114 Patient's other noncompliance with medication regimen: Secondary | ICD-10-CM

## 2013-08-16 DIAGNOSIS — F4322 Adjustment disorder with anxiety: Secondary | ICD-10-CM | POA: Diagnosis present

## 2013-08-16 DIAGNOSIS — E871 Hypo-osmolality and hyponatremia: Secondary | ICD-10-CM

## 2013-08-16 DIAGNOSIS — D72829 Elevated white blood cell count, unspecified: Secondary | ICD-10-CM | POA: Diagnosis present

## 2013-08-16 DIAGNOSIS — L0231 Cutaneous abscess of buttock: Secondary | ICD-10-CM | POA: Diagnosis present

## 2013-08-16 DIAGNOSIS — Z598 Other problems related to housing and economic circumstances: Secondary | ICD-10-CM

## 2013-08-16 DIAGNOSIS — Z9119 Patient's noncompliance with other medical treatment and regimen: Secondary | ICD-10-CM

## 2013-08-16 DIAGNOSIS — R509 Fever, unspecified: Secondary | ICD-10-CM | POA: Diagnosis not present

## 2013-08-16 DIAGNOSIS — Z825 Family history of asthma and other chronic lower respiratory diseases: Secondary | ICD-10-CM

## 2013-08-16 DIAGNOSIS — Z833 Family history of diabetes mellitus: Secondary | ICD-10-CM

## 2013-08-16 HISTORY — DX: Type 2 diabetes mellitus with ketoacidosis without coma: E11.10

## 2013-08-16 LAB — PREGNANCY, URINE: PREG TEST UR: NEGATIVE

## 2013-08-16 LAB — CBC WITH DIFFERENTIAL/PLATELET
BASOS ABS: 0 10*3/uL (ref 0.0–0.1)
BASOS PCT: 0 % (ref 0–1)
EOS ABS: 0 10*3/uL (ref 0.0–0.7)
Eosinophils Relative: 0 % (ref 0–5)
HCT: 39.1 % (ref 36.0–46.0)
HEMOGLOBIN: 13.2 g/dL (ref 12.0–15.0)
LYMPHS ABS: 2.8 10*3/uL (ref 0.7–4.0)
Lymphocytes Relative: 6 % — ABNORMAL LOW (ref 12–46)
MCH: 27.9 pg (ref 26.0–34.0)
MCHC: 33.8 g/dL (ref 30.0–36.0)
MCV: 82.7 fL (ref 78.0–100.0)
Monocytes Absolute: 4.2 10*3/uL — ABNORMAL HIGH (ref 0.1–1.0)
Monocytes Relative: 9 % (ref 3–12)
NEUTROS ABS: 39.3 10*3/uL — AB (ref 1.7–7.7)
Neutrophils Relative %: 85 % — ABNORMAL HIGH (ref 43–77)
Platelets: 482 10*3/uL — ABNORMAL HIGH (ref 150–400)
RBC: 4.73 MIL/uL (ref 3.87–5.11)
RDW: 13.1 % (ref 11.5–15.5)
WBC: 46.3 10*3/uL — ABNORMAL HIGH (ref 4.0–10.5)

## 2013-08-16 LAB — URINALYSIS, ROUTINE W REFLEX MICROSCOPIC
BILIRUBIN URINE: NEGATIVE
Glucose, UA: >1000 mg/dL — AB
Hgb urine dipstick: NEGATIVE
Ketones, ur: >80 mg/dL — AB
LEUKOCYTES UA: NEGATIVE
Nitrite: NEGATIVE
Protein, ur: 30 mg/dL — AB
SPECIFIC GRAVITY, URINE: 1.029 (ref 1.005–1.030)
UROBILINOGEN UA: 0.2 mg/dL (ref 0.0–1.0)
pH: 5 (ref 5.0–8.0)

## 2013-08-16 LAB — BASIC METABOLIC PANEL
BUN: 18 mg/dL (ref 6–23)
CALCIUM: 11.1 mg/dL — AB (ref 8.4–10.5)
CO2: <7 mEq/L — CL (ref 19–32)
Chloride: 75 mEq/L — ABNORMAL LOW (ref 96–112)
Creatinine, Ser: 0.78 mg/dL (ref 0.50–1.10)
GFR calc Af Amer: >90 mL/min (ref >90)
Glucose, Bld: 961 mg/dL (ref 70–99)
Potassium: 5.8 mEq/L — ABNORMAL HIGH (ref 3.7–5.3)
SODIUM: 121 meq/L — AB (ref 137–147)

## 2013-08-16 LAB — BLOOD GAS, ARTERIAL
ACID-BASE DEFICIT: 26.2 mmol/L — AB (ref 0.0–2.0)
BICARBONATE: 2.5 meq/L — AB (ref 20.0–24.0)
DRAWN BY: 232811
FIO2: 0.21 %
O2 Saturation: 97 %
PO2 ART: 131 mmHg — AB (ref 80.0–100.0)
Patient temperature: 98.6
TCO2: 2.4 mmol/L (ref 0–100)
pCO2 arterial: 7.3 mmHg — CL (ref 35.0–45.0)
pH, Arterial: 7.162 — CL (ref 7.350–7.450)

## 2013-08-16 LAB — I-STAT CG4 LACTIC ACID, ED: Lactic Acid, Venous: 4.2 mmol/L — ABNORMAL HIGH (ref 0.5–2.2)

## 2013-08-16 LAB — URINE MICROSCOPIC-ADD ON

## 2013-08-16 LAB — TROPONIN I: Troponin I: <0.3 ng/mL (ref <0.30)

## 2013-08-16 LAB — CBG MONITORING, ED: Glucose-Capillary: >600 mg/dL (ref 70–99)

## 2013-08-16 MED ORDER — SODIUM CHLORIDE 0.9 % IV SOLN
1000.0000 mL | Freq: Once | INTRAVENOUS | Status: DC
  Administered 2013-08-17: 1000 mL via INTRAVENOUS

## 2013-08-16 MED ORDER — SODIUM CHLORIDE 0.9 % IV SOLN
INTRAVENOUS | Status: DC
  Administered 2013-08-17: 11 [IU]/h via INTRAVENOUS
  Administered 2013-08-17: 5.9 [IU]/h via INTRAVENOUS
  Filled 2013-08-16 (×4): qty 1

## 2013-08-16 MED ORDER — SODIUM CHLORIDE 0.9 % IV BOLUS (SEPSIS)
1000.0000 mL | Freq: Once | INTRAVENOUS | Status: AC
  Administered 2013-08-17: 1000 mL via INTRAVENOUS

## 2013-08-16 MED ORDER — ONDANSETRON HCL 4 MG/2ML IJ SOLN
4.0000 mg | INTRAMUSCULAR | Status: AC
  Administered 2013-08-16: 4 mg via INTRAVENOUS

## 2013-08-16 MED ORDER — SODIUM CHLORIDE 0.9 % IV BOLUS (SEPSIS)
1000.0000 mL | Freq: Once | INTRAVENOUS | Status: AC
  Administered 2013-08-16: 1000 mL via INTRAVENOUS

## 2013-08-16 MED ORDER — SODIUM CHLORIDE 0.9 % IV SOLN
1000.0000 mL | INTRAVENOUS | Status: DC
Start: 2013-08-16 — End: 2013-08-17
  Administered 2013-08-16: 1000 mL via INTRAVENOUS

## 2013-08-16 NOTE — ED Notes (Signed)
Critical Values: Sodium 121, CO2 <7, Glucose 961

## 2013-08-16 NOTE — ED Notes (Signed)
Per EMS pt was combative in the field refusing to come to hospital.  Pt is type 1 DM and refusing to take insulin properly.  Glucose in the field too high to register.

## 2013-08-16 NOTE — ED Notes (Signed)
Bed: WA17 Expected date:  Expected time:  Means of arrival:  Comments: EMS/23 yo non-compliant with Diabetes meds/combative-blood sugar high

## 2013-08-16 NOTE — ED Notes (Signed)
Ashley Freeman, [place] notified of critical Na, CO2 and glucose.

## 2013-08-16 NOTE — ED Provider Notes (Signed)
CSN: [id number]     Arrival date & time 08/16/13  2217 History   First MD Initiated Contact with Patient 08/16/13 2223     Chief Complaint  Patient presents with  . Hyperglycemia    (Consider location/radiation/quality/duration/timing/severity/associated sxs/prior Treatment) HPI Comments: Patient is a 23 year old female with a history of diabetes mellitus who presents to the emergency department for hyperglycemia. Per EMS, patient was combative in the field and refusing to come to the hospital. Patient states her only symptoms have been "feeling sleepy". She states she has been noncompliant with her diabetes regimen and she cannot expand on the reason for her noncompliance. Patient states that she did administer 10 units of NovoLog at lunchtime today. She denies taking her Lantus. Patient denies associated fever, cough, chest pain, shortness of breath, nausea, vomiting, diarrhea, polyuria, polydipsia, dysuria, hematuria, numbness/tingling, weakness, and syncope. Patient was discharged from the hospital on 08/12/2013 after admission for DKA. CBG on arrival >600.  PCP - Dr. Karlyn Agee  Patient is a 23 y.o. female presenting with hyperglycemia. The history is provided by the patient. No language interpreter was used.  Hyperglycemia Associated symptoms: fatigue     Past Medical History  Diagnosis Date  . Gonorrhea 08/2011    Treated in 09/2011  . Diabetes mellitus 2001    Diagnosed at age 53 ; Type I   Past Surgical History  Procedure Laterality Date  . No past surgeries     Family History  Problem Relation Age of Onset  . Anesthesia problems Neg Hx   . Asthma Mother   . Gout Father   . Diabetes Paternal Grandmother    History  Substance Use Topics  . Smoking status: Never Smoker   . Smokeless tobacco: Never Used  . Alcohol Use: No   OB History   Grav Para Term Preterm Abortions TAB SAB Ect Mult Living   1 0   1  1   0      Review of Systems  Constitutional: Positive for  fatigue.       "feeling sleepy"  Neurological: Negative for syncope.  All other systems reviewed and are negative.    Allergies  Penicillins  Home Medications   Prior to Admission medications   Medication Sig Start Date End Date Taking? Authorizing Provider  insulin aspart (NOVOLOG) 100 UNIT/ML FlexPen Inject 6 Units into the skin 3 (three) times daily with meals. 08/11/13   Lesly Dukes, MD  insulin glargine (LANTUS) 100 unit/mL SOPN Inject 0.2 mLs (20 Units total) into the skin every morning. 08/11/13   Lesly Dukes, MD  Insulin Pen Needle 32G X 4 MM MISC For use with Lantus and Novolog insulin pens 4 times daily, in the morning and with meals 08/11/13   Lesly Dukes, MD   BP 140/90  Pulse 137  Temp(Src) 99.3 F (37.4 C) (Rectal)  Resp 32  SpO2 100%  LMP 07/17/2013  Physical Exam  Nursing note and vitals reviewed. Constitutional: She is oriented to person, place, and time. She appears well-developed and well-nourished. No distress.  Patient pale and mildly septic appearing  HENT:  Head: Normocephalic and atraumatic.  Mouth/Throat: No oropharyngeal exudate.  Mucous membranes dry  Eyes: Conjunctivae and EOM are normal. Pupils are equal, round, and reactive to light. No scleral icterus.  Neck: Normal range of motion.  Cardiovascular: Regular rhythm, normal heart sounds and normal pulses.  Tachycardia present.   Pulmonary/Chest: No respiratory distress. She has no wheezes. She has no rales.  Tachypnea  with mild retractions. No respiratory distress. Lungs clear bilaterally.  Abdominal: Soft. She exhibits no distension and no mass. There is no tenderness. There is no rebound and no guarding.  Musculoskeletal: Normal range of motion.  Neurological: She is alert and oriented to person, place, and time. No cranial nerve deficit. Coordination normal. GCS eye subscore is 4. GCS verbal subscore is 5. GCS motor subscore is 6.  GCS 15. Speech is goal oriented. Patient moves extremities  without ataxia. No focal neurologic deficits appreciated.  Skin: Skin is warm and dry. No rash noted. She is not diaphoretic. No erythema. There is pallor.  Psychiatric: She has a normal mood and affect. Her behavior is normal.    ED Course  Procedures (including critical care time) Labs Review Labs Reviewed  URINALYSIS, ROUTINE W REFLEX MICROSCOPIC - Abnormal; Notable for the following:    Glucose, UA >1000 (*)    Ketones, ur >80 (*)    Protein, ur 30 (*)    All other components within normal limits  CBC WITH DIFFERENTIAL - Abnormal; Notable for the following:    WBC 46.3 (*)    Platelets 482 (*)    Neutrophils Relative % 85 (*)    Lymphocytes Relative 6 (*)    Neutro Abs 39.3 (*)    Monocytes Absolute 4.2 (*)    All other components within normal limits  BLOOD GAS, ARTERIAL - Abnormal; Notable for the following:    pH, Arterial 7.162 (*)    pCO2 arterial 7.3 (*)    pO2, Arterial 131.0 (*)    Bicarbonate 2.5 (*)    Acid-base deficit 26.2 (*)    All other components within normal limits  BASIC METABOLIC PANEL - Abnormal; Notable for the following:    Sodium 121 (*)    Potassium 5.8 (*)    Chloride 75 (*)    CO2 <7 (*)    Glucose, Bld 961 (*)    Calcium 11.1 (*)    All other components within normal limits  CBG MONITORING, ED - Abnormal; Notable for the following:    Glucose-Capillary >600 (*)    All other components within normal limits  I-STAT CG4 LACTIC ACID, ED - Abnormal; Notable for the following:    Lactic Acid, Venous 4.20 (*)    All other components within normal limits  CBG MONITORING, ED - Abnormal; Notable for the following:    Glucose-Capillary >600 (*)    All other components within normal limits  CULTURE, BLOOD (ROUTINE X 2)  CULTURE, BLOOD (ROUTINE X 2)  URINE CULTURE  PREGNANCY, URINE  TROPONIN I  URINE MICROSCOPIC-ADD ON  CBG MONITORING, ED  I-STAT CHEM 8, ED   Imaging Review Dg Chest 2 View  08/17/2013   CLINICAL DATA:  Hyperglycemia diabetes  shortness of breath.  EXAM: CHEST  2 VIEW  COMPARISON:  08/07/2013  FINDINGS: The heart size and mediastinal contours are within normal limits. Both lungs are clear. The visualized skeletal structures are unremarkable.  IMPRESSION: No active cardiopulmonary disease.   Electronically Signed   By: Lucienne Capers M.D.   On: 08/17/2013 00:05     EKG Interpretation   Date/Time:  Sunday August 16 2013 22:41:14 EDT Ventricular Rate:  134 PR Interval:  123 QRS Duration: 77 QT Interval:  291 QTC Calculation: 434 R Axis:   68 Text Interpretation:  Sinus tachycardia Probable left atrial enlargement  Confirmed by Kathrynn Humble, MD, Thelma Comp [phone]) on 08/16/2013 11:29:36 PM      CRITICAL CARE Performed  by: Antonietta Breach   Total critical care time: 78  Critical care time was exclusive of separately billable procedures and treating other patients.  Critical care was necessary to treat or prevent imminent or life-threatening deterioration.  Critical care was time spent personally by me on the following activities: development of treatment plan with patient and/or surrogate as well as nursing, discussions with consultants, evaluation of patient's response to treatment, examination of patient, obtaining history from patient or surrogate, ordering and performing treatments and interventions, ordering and review of laboratory studies, ordering and review of radiographic studies, pulse oximetry and re-evaluation of patient's condition.   MDM   Final diagnoses:  DKA (diabetic ketoacidoses)  Sepsis    23 year old female with a history of diabetes mellitus and medication noncompliance presents to the emergency Department in DKA. Blood sugar on arrival 961. Patient acidotic with arterial pH of 7.162. Anion gap 39. Lactate 4.20. Patient tachypneic and tachycardic, but mentating well. Per labs and vitals, patient does meet sepsis criteria. Have consulted with critical care who believe patient can be managed by her  primary care admitting team. He does not see indication to start IV abx; blood cultures are pending. No evidence of infectious etiology on work up.   Patient to be transferred to [facility] for admission to [facility] ICU. EMTALA completed for transfer. Admitting physician Dr. Kandra Nicolas. Patient stable for transfer at this time. 3rd L IVF infusing as well as Glucose stabilizer. Central line in place by Dr. Kathrynn Humble.   Filed Vitals:   08/16/13 2220 08/16/13 2343  BP: 182/98 140/90  Pulse: 153 137  Temp: 98.1 F (36.7 C) 99.3 F (37.4 C)  TempSrc: Oral Rectal  Resp: 26 32  SpO2: 98% 100%       Antonietta Breach, PA-C 08/17/13 0119

## 2013-08-17 DIAGNOSIS — R509 Fever, unspecified: Secondary | ICD-10-CM | POA: Diagnosis not present

## 2013-08-17 DIAGNOSIS — D72829 Elevated white blood cell count, unspecified: Secondary | ICD-10-CM | POA: Diagnosis present

## 2013-08-17 LAB — GLUCOSE, CAPILLARY
GLUCOSE-CAPILLARY: 421 mg/dL — AB (ref 70–99)
GLUCOSE-CAPILLARY: 223 mg/dL — AB (ref 70–99)
GLUCOSE-CAPILLARY: 183 mg/dL — AB (ref 70–99)
GLUCOSE-CAPILLARY: 168 mg/dL — AB (ref 70–99)
GLUCOSE-CAPILLARY: 116 mg/dL — AB (ref 70–99)
GLUCOSE-CAPILLARY: 114 mg/dL — AB (ref 70–99)
GLUCOSE-CAPILLARY: 204 mg/dL — AB (ref 70–99)
GLUCOSE-CAPILLARY: 334 mg/dL — AB (ref 70–99)
GLUCOSE-CAPILLARY: 323 mg/dL — AB (ref 70–99)
GLUCOSE-CAPILLARY: 208 mg/dL — AB (ref 70–99)
Glucose-Capillary: 286 mg/dL — ABNORMAL HIGH (ref 70–99)
Glucose-Capillary: 215 mg/dL — ABNORMAL HIGH (ref 70–99)
Glucose-Capillary: 198 mg/dL — ABNORMAL HIGH (ref 70–99)
Glucose-Capillary: 161 mg/dL — ABNORMAL HIGH (ref 70–99)
Glucose-Capillary: 167 mg/dL — ABNORMAL HIGH (ref 70–99)
Glucose-Capillary: 144 mg/dL — ABNORMAL HIGH (ref 70–99)
Glucose-Capillary: 206 mg/dL — ABNORMAL HIGH (ref 70–99)
Glucose-Capillary: 281 mg/dL — ABNORMAL HIGH (ref 70–99)
Glucose-Capillary: 160 mg/dL — ABNORMAL HIGH (ref 70–99)

## 2013-08-17 LAB — BASIC METABOLIC PANEL
BUN: 14 mg/dL (ref 6–23)
BUN: 11 mg/dL (ref 6–23)
BUN: 9 mg/dL (ref 6–23)
BUN: 7 mg/dL (ref 6–23)
BUN: 7 mg/dL (ref 6–23)
BUN: 6 mg/dL (ref 6–23)
BUN: 6 mg/dL (ref 6–23)
BUN: 5 mg/dL — ABNORMAL LOW (ref 6–23)
BUN: 6 mg/dL (ref 6–23)
CALCIUM: 8.8 mg/dL (ref 8.4–10.5)
CALCIUM: 8.4 mg/dL (ref 8.4–10.5)
CALCIUM: 8.1 mg/dL — AB (ref 8.4–10.5)
CALCIUM: 8.4 mg/dL (ref 8.4–10.5)
CHLORIDE: 106 meq/L (ref 96–112)
CHLORIDE: 100 meq/L (ref 96–112)
CO2: <7 mEq/L — CL (ref 19–32)
CO2: <7 mEq/L — CL (ref 19–32)
CO2: 11 mEq/L — ABNORMAL LOW (ref 19–32)
CO2: 15 mEq/L — ABNORMAL LOW (ref 19–32)
CO2: 19 mEq/L (ref 19–32)
CO2: 18 mEq/L — ABNORMAL LOW (ref 19–32)
CO2: 15 mEq/L — ABNORMAL LOW (ref 19–32)
CO2: 13 mEq/L — ABNORMAL LOW (ref 19–32)
CO2: 14 mEq/L — ABNORMAL LOW (ref 19–32)
CREATININE: 0.59 mg/dL (ref 0.50–1.10)
CREATININE: 0.46 mg/dL — AB (ref 0.50–1.10)
Calcium: 8.2 mg/dL — ABNORMAL LOW (ref 8.4–10.5)
Calcium: 8.3 mg/dL — ABNORMAL LOW (ref 8.4–10.5)
Calcium: 8.3 mg/dL — ABNORMAL LOW (ref 8.4–10.5)
Calcium: 8.1 mg/dL — ABNORMAL LOW (ref 8.4–10.5)
Calcium: 8.2 mg/dL — ABNORMAL LOW (ref 8.4–10.5)
Chloride: 102 mEq/L (ref 96–112)
Chloride: 105 mEq/L (ref 96–112)
Chloride: 107 mEq/L (ref 96–112)
Chloride: 103 mEq/L (ref 96–112)
Chloride: 103 mEq/L (ref 96–112)
Chloride: 100 mEq/L (ref 96–112)
Chloride: 99 mEq/L (ref 96–112)
Creatinine, Ser: 0.49 mg/dL — ABNORMAL LOW (ref 0.50–1.10)
Creatinine, Ser: 0.48 mg/dL — ABNORMAL LOW (ref 0.50–1.10)
Creatinine, Ser: 0.44 mg/dL — ABNORMAL LOW (ref 0.50–1.10)
Creatinine, Ser: 0.42 mg/dL — ABNORMAL LOW (ref 0.50–1.10)
Creatinine, Ser: 0.45 mg/dL — ABNORMAL LOW (ref 0.50–1.10)
Creatinine, Ser: 0.47 mg/dL — ABNORMAL LOW (ref 0.50–1.10)
Creatinine, Ser: 0.5 mg/dL (ref 0.50–1.10)
GFR calc Af Amer: >90 mL/min (ref >90)
GFR calc non Af Amer: >90 mL/min (ref >90)
GFR calc non Af Amer: >90 mL/min (ref >90)
GFR calc non Af Amer: >90 mL/min (ref >90)
GFR calc non Af Amer: >90 mL/min (ref >90)
GFR calc non Af Amer: >90 mL/min (ref >90)
Glucose, Bld: 364 mg/dL — ABNORMAL HIGH (ref 70–99)
Glucose, Bld: 246 mg/dL — ABNORMAL HIGH (ref 70–99)
Glucose, Bld: 211 mg/dL — ABNORMAL HIGH (ref 70–99)
Glucose, Bld: 181 mg/dL — ABNORMAL HIGH (ref 70–99)
Glucose, Bld: 126 mg/dL — ABNORMAL HIGH (ref 70–99)
Glucose, Bld: 133 mg/dL — ABNORMAL HIGH (ref 70–99)
Glucose, Bld: 247 mg/dL — ABNORMAL HIGH (ref 70–99)
Glucose, Bld: 222 mg/dL — ABNORMAL HIGH (ref 70–99)
Glucose, Bld: 240 mg/dL — ABNORMAL HIGH (ref 70–99)
POTASSIUM: 3.7 meq/L (ref 3.7–5.3)
POTASSIUM: 3.1 meq/L — AB (ref 3.7–5.3)
Potassium: 4.3 mEq/L (ref 3.7–5.3)
Potassium: 3.8 mEq/L (ref 3.7–5.3)
Potassium: 3.5 mEq/L — ABNORMAL LOW (ref 3.7–5.3)
Potassium: 3 mEq/L — ABNORMAL LOW (ref 3.7–5.3)
Potassium: 3.6 mEq/L — ABNORMAL LOW (ref 3.7–5.3)
Potassium: 3.3 mEq/L — ABNORMAL LOW (ref 3.7–5.3)
Potassium: 3.6 mEq/L — ABNORMAL LOW (ref 3.7–5.3)
SODIUM: 140 meq/L (ref 137–147)
SODIUM: 139 meq/L (ref 137–147)
SODIUM: 138 meq/L (ref 137–147)
SODIUM: 137 meq/L (ref 137–147)
SODIUM: 133 meq/L — AB (ref 137–147)
SODIUM: 135 meq/L — AB (ref 137–147)
Sodium: 134 mEq/L — ABNORMAL LOW (ref 137–147)
Sodium: 138 mEq/L (ref 137–147)
Sodium: 134 mEq/L — ABNORMAL LOW (ref 137–147)

## 2013-08-17 LAB — URINALYSIS, ROUTINE W REFLEX MICROSCOPIC
Bilirubin Urine: NEGATIVE
Glucose, UA: >1000 mg/dL — AB
Ketones, ur: >80 mg/dL — AB
NITRITE: NEGATIVE
PH: 6 (ref 5.0–8.0)
Protein, ur: 100 mg/dL — AB
SPECIFIC GRAVITY, URINE: 1.025 (ref 1.005–1.030)
UROBILINOGEN UA: 0.2 mg/dL (ref 0.0–1.0)

## 2013-08-17 LAB — CBC WITH DIFFERENTIAL/PLATELET
BASOS ABS: 0 10*3/uL (ref 0.0–0.1)
BASOS ABS: 0 10*3/uL (ref 0.0–0.1)
Basophils Relative: 0 % (ref 0–1)
Basophils Relative: 0 % (ref 0–1)
EOS PCT: 0 % (ref 0–5)
Eosinophils Absolute: 0 10*3/uL (ref 0.0–0.7)
Eosinophils Absolute: 0 10*3/uL (ref 0.0–0.7)
Eosinophils Relative: 0 % (ref 0–5)
HCT: 27.6 % — ABNORMAL LOW (ref 36.0–46.0)
HCT: 26.1 % — ABNORMAL LOW (ref 36.0–46.0)
Hemoglobin: 9.3 g/dL — ABNORMAL LOW (ref 12.0–15.0)
Hemoglobin: 9 g/dL — ABNORMAL LOW (ref 12.0–15.0)
LYMPHS PCT: 11 % — AB (ref 12–46)
LYMPHS PCT: 14 % (ref 12–46)
Lymphs Abs: 3.5 10*3/uL (ref 0.7–4.0)
Lymphs Abs: 3.7 10*3/uL (ref 0.7–4.0)
MCH: 27.5 pg (ref 26.0–34.0)
MCH: 27.7 pg (ref 26.0–34.0)
MCHC: 33.7 g/dL (ref 30.0–36.0)
MCHC: 34.5 g/dL (ref 30.0–36.0)
MCV: 81.7 fL (ref 78.0–100.0)
MCV: 80.3 fL (ref 78.0–100.0)
Monocytes Absolute: 2.9 10*3/uL — ABNORMAL HIGH (ref 0.1–1.0)
Monocytes Absolute: 2.1 10*3/uL — ABNORMAL HIGH (ref 0.1–1.0)
Monocytes Relative: 9 % (ref 3–12)
Monocytes Relative: 8 % (ref 3–12)
NEUTROS PCT: 78 % — AB (ref 43–77)
Neutro Abs: 25.7 10*3/uL — ABNORMAL HIGH (ref 1.7–7.7)
Neutro Abs: 20.5 10*3/uL — ABNORMAL HIGH (ref 1.7–7.7)
Neutrophils Relative %: 80 % — ABNORMAL HIGH (ref 43–77)
Platelets: 331 10*3/uL (ref 150–400)
Platelets: 327 10*3/uL (ref 150–400)
RBC: 3.38 MIL/uL — AB (ref 3.87–5.11)
RBC: 3.25 MIL/uL — ABNORMAL LOW (ref 3.87–5.11)
RDW: 13.2 % (ref 11.5–15.5)
RDW: 12.7 % (ref 11.5–15.5)
WBC: 32.1 10*3/uL — ABNORMAL HIGH (ref 4.0–10.5)
WBC: 26.3 10*3/uL — AB (ref 4.0–10.5)

## 2013-08-17 LAB — COMPREHENSIVE METABOLIC PANEL
ALK PHOS: 142 U/L — AB (ref 39–117)
ALT: 6 U/L (ref 0–35)
AST: 13 U/L (ref 0–37)
Albumin: 1.9 g/dL — ABNORMAL LOW (ref 3.5–5.2)
BUN: 7 mg/dL (ref 6–23)
CHLORIDE: 96 meq/L (ref 96–112)
CO2: 9 meq/L — AB (ref 19–32)
Calcium: 7.9 mg/dL — ABNORMAL LOW (ref 8.4–10.5)
Creatinine, Ser: 0.48 mg/dL — ABNORMAL LOW (ref 0.50–1.10)
GLUCOSE: 353 mg/dL — AB (ref 70–99)
POTASSIUM: 3.2 meq/L — AB (ref 3.7–5.3)
Sodium: 131 mEq/L — ABNORMAL LOW (ref 137–147)
Total Bilirubin: <0.2 mg/dL — ABNORMAL LOW (ref 0.3–1.2)
Total Protein: 6.1 g/dL (ref 6.0–8.3)

## 2013-08-17 LAB — CBC
HCT: 30.5 % — ABNORMAL LOW (ref 36.0–46.0)
HEMOGLOBIN: 10.3 g/dL — AB (ref 12.0–15.0)
MCH: 28.2 pg (ref 26.0–34.0)
MCHC: 33.8 g/dL (ref 30.0–36.0)
MCV: 83.6 fL (ref 78.0–100.0)
Platelets: 391 10*3/uL (ref 150–400)
RBC: 3.65 MIL/uL — AB (ref 3.87–5.11)
RDW: 13.2 % (ref 11.5–15.5)
WBC: 45 10*3/uL — ABNORMAL HIGH (ref 4.0–10.5)

## 2013-08-17 LAB — LACTIC ACID, PLASMA
LACTIC ACID, VENOUS: 1.3 mmol/L (ref 0.5–2.2)
Lactic Acid, Venous: 1.1 mmol/L (ref 0.5–2.2)

## 2013-08-17 LAB — MAGNESIUM
MAGNESIUM: 2.8 mg/dL — AB (ref 1.5–2.5)
Magnesium: 1.5 mg/dL (ref 1.5–2.5)

## 2013-08-17 LAB — I-STAT CHEM 8, ED
BUN: 16 mg/dL (ref 6–23)
Calcium, Ion: 1.25 mmol/L — ABNORMAL HIGH (ref 1.12–1.23)
Chloride: 104 mEq/L (ref 96–112)
Creatinine, Ser: 0.8 mg/dL (ref 0.50–1.10)
Glucose, Bld: >700 mg/dL (ref 70–99)
HEMATOCRIT: 37 % (ref 36.0–46.0)
Hemoglobin: 12.6 g/dL (ref 12.0–15.0)
POTASSIUM: 5.1 meq/L (ref 3.7–5.3)
SODIUM: 131 meq/L — AB (ref 137–147)
TCO2: 6 mmol/L (ref 0–100)

## 2013-08-17 LAB — CBG MONITORING, ED
GLUCOSE-CAPILLARY: 568 mg/dL — AB (ref 70–99)
GLUCOSE-CAPILLARY: 477 mg/dL — AB (ref 70–99)
Glucose-Capillary: >600 mg/dL (ref 70–99)

## 2013-08-17 LAB — URINE MICROSCOPIC-ADD ON

## 2013-08-17 LAB — PHOSPHORUS: PHOSPHORUS: 1.2 mg/dL — AB (ref 2.3–4.6)

## 2013-08-17 MED ORDER — POTASSIUM CHLORIDE 10 MEQ/50ML IV SOLN: 10.0000 meq | INTRAVENOUS | Status: AC

## 2013-08-17 MED ORDER — SODIUM CHLORIDE 0.9 % IV BOLUS (SEPSIS)
2000.0000 mL | Freq: Once | INTRAVENOUS | Status: AC
  Administered 2013-08-17: 2000 mL via INTRAVENOUS

## 2013-08-17 MED ORDER — SODIUM CHLORIDE 0.9 % IV SOLN
INTRAVENOUS | Status: DC
  Administered 2013-08-17: 06:00:00 via INTRAVENOUS

## 2013-08-17 MED ORDER — MAGNESIUM SULFATE 4000MG/100ML IJ SOLN
4.0000 g | Freq: Once | INTRAMUSCULAR | Status: AC
  Administered 2013-08-17: 4 g via INTRAVENOUS
  Filled 2013-08-17: qty 100

## 2013-08-17 MED ORDER — POTASSIUM CHLORIDE 10 MEQ/50ML IV SOLN
10.0000 meq | INTRAVENOUS | Status: DC
  Administered 2013-08-17 (×2): 10 meq via INTRAVENOUS
  Filled 2013-08-17: qty 50

## 2013-08-17 MED ORDER — POTASSIUM CHLORIDE 10 MEQ/50ML IV SOLN
10.0000 meq | INTRAVENOUS | Status: DC
  Administered 2013-08-17: 10 meq via INTRAVENOUS
  Filled 2013-08-17: qty 50

## 2013-08-17 MED ORDER — ACETAMINOPHEN 650 MG RE SUPP
650.0000 mg | Freq: Four times a day (QID) | RECTAL | Status: DC | PRN
  Filled 2013-08-17 (×2): qty 1

## 2013-08-17 MED ORDER — ACETAMINOPHEN 325 MG PO TABS: 650.0000 mg | ORAL_TABLET | Freq: Four times a day (QID) | ORAL | Status: DC | PRN

## 2013-08-17 MED ORDER — SODIUM CHLORIDE 0.9 % IV SOLN
Freq: Once | INTRAVENOUS | Status: AC
  Administered 2013-08-17: 01:00:00 via INTRAVENOUS

## 2013-08-17 MED ORDER — STERILE WATER FOR INJECTION IV SOLN
INTRAVENOUS | Status: DC
  Administered 2013-08-17: 05:00:00 via INTRAVENOUS
  Filled 2013-08-17 (×5): qty 850

## 2013-08-17 MED ORDER — ENOXAPARIN SODIUM 40 MG/0.4ML ~~LOC~~ SOLN
40.0000 mg | SUBCUTANEOUS | Status: DC
  Administered 2013-08-17 – 2013-08-21 (×5): 40 mg via SUBCUTANEOUS
  Filled 2013-08-17 (×5): qty 0.4

## 2013-08-17 MED ORDER — DEXTROSE-NACL 5-0.45 % IV SOLN
INTRAVENOUS | Status: DC
  Administered 2013-08-17: 04:00:00 via INTRAVENOUS

## 2013-08-17 MED ORDER — SODIUM CHLORIDE 0.9 % IV SOLN: INTRAVENOUS | Status: DC

## 2013-08-17 MED ORDER — POTASSIUM PHOSPHATE DIBASIC 3 MMOLE/ML IV SOLN
30.0000 mmol | Freq: Once | INTRAVENOUS | Status: AC
  Administered 2013-08-17: 30 mmol via INTRAVENOUS
  Filled 2013-08-17: qty 10

## 2013-08-17 MED ORDER — POTASSIUM CHLORIDE 10 MEQ/50ML IV SOLN
10.0000 meq | INTRAVENOUS | Status: AC
  Administered 2013-08-17 – 2013-08-18 (×3): 10 meq via INTRAVENOUS
  Filled 2013-08-17: qty 50

## 2013-08-17 MED ORDER — SODIUM CHLORIDE 0.9 % IV SOLN
Freq: Once | INTRAVENOUS | Status: AC
  Administered 2013-08-17: 21:00:00 via INTRAVENOUS

## 2013-08-17 MED ORDER — POTASSIUM CHLORIDE 10 MEQ/100ML IV SOLN: 10.0000 meq | INTRAVENOUS | Status: DC

## 2013-08-17 MED ORDER — VANCOMYCIN HCL 500 MG IV SOLR
500.0000 mg | Freq: Three times a day (TID) | INTRAVENOUS | Status: DC
  Administered 2013-08-17 – 2013-08-19 (×6): 500 mg via INTRAVENOUS
  Filled 2013-08-17 (×8): qty 500

## 2013-08-17 MED ORDER — SODIUM CHLORIDE 0.9 % IV BOLUS (SEPSIS)
1000.0000 mL | Freq: Once | INTRAVENOUS | Status: DC
  Administered 2013-08-17: 1000 mL via INTRAVENOUS

## 2013-08-17 MED ORDER — DEXTROSE 5 % IV SOLN
1.0000 g | Freq: Three times a day (TID) | INTRAVENOUS | Status: DC
  Administered 2013-08-17 – 2013-08-18 (×4): 1 g via INTRAVENOUS
  Filled 2013-08-17 (×6): qty 1

## 2013-08-17 MED ORDER — DEXTROSE 50 % IV SOLN: 25.0000 mL | INTRAVENOUS | Status: DC | PRN

## 2013-08-17 MED ORDER — HEPARIN SODIUM (PORCINE) 5000 UNIT/ML IJ SOLN: 5000.0000 [IU] | Freq: Three times a day (TID) | INTRAMUSCULAR | Status: DC

## 2013-08-17 MED ORDER — SODIUM BICARBONATE 8.4 % IV SOLN
INTRAVENOUS | Status: DC
  Filled 2013-08-17 (×2): qty 1000

## 2013-08-17 NOTE — H&P (Signed)
Date: 08/17/2013               Patient Name:  Ashley Freeman MRN: [id number]  DOB: 10-12-1990 Age / Sex: 22 y.o., female   PCP: Cresenciano Genre, MD         Medical Service: Internal Medicine Teaching Service         Attending Physician: Dr. Dominic Pea, DO    First Contact: Dr. Ronnald Ramp Pager: [phone]  Second Contact: Dr. Eula Fried Pager: [phone]       After Hours (After 5p/  First Contact Pager: [phone]  weekends / holidays): Second Contact Pager: [phone]   Chief Complaint: hyperglycemia  History of Present Illness:  [name] is a 23 year old woman with history of DM1 (A1C 19.3%) and medication noncompliance who presents with DKA.   Per EMS, patient was combative and refusing to come to the hospital. Patient states her only symptoms have been "feeling sleepy." She has been noncompliant with her diabetes regimen stating she doesn't have any insulin at home "because it doesn't work."  She has not taken any Lantus or Novolog since discharge on 08/12/13 (admitted for DKA).  Denies fever, chest pain, shortness of breath, cough, abdominal pain, nausea/vomiting, diarrhea, polyuria, polydipsia, dysuria, hematuria, numbness/tingling, weakness, or syncope.   In ED, labs consistent with DKA: glucose 961, HCO3 <7, AG 39, pH 7.16, lactic acid 4.2, UA positive for glucosuria and ketonuria.  She was started on IV hydration, insulin, admitted to IMTS for further management.   Meds: Current Facility-Administered Medications  Medication Dose Route Frequency Provider Last Rate Last Dose  . 0.9 %  sodium chloride infusion   Intravenous Continuous Jessee Avers, MD      . dextrose 5 %-0.45 % sodium chloride infusion   Intravenous Continuous Jessee Avers, MD      . dextrose 50 % solution 25 mL  25 mL Intravenous PRN Jessee Avers, MD      . enoxaparin (LOVENOX) injection 40 mg  40 mg Subcutaneous Q24H Jessee Avers, MD      . insulin regular (NOVOLIN R,HUMULIN R) 1 Units/mL in  sodium chloride 0.9 % 100 mL infusion   Intravenous Continuous Antonietta Breach, PA-C 3.6 mL/hr at 08/17/13 0300 3.6 Units/hr at 08/17/13 0300  . ondansetron (ZOFRAN) injection 4 mg  4 mg Intravenous STAT Antonietta Breach, PA-C      . sodium bicarbonate 150 mEq in sterile water 1,000 mL infusion   Intravenous Continuous Jessee Avers, MD      . sodium chloride 0.9 % bolus 2,000 mL  2,000 mL Intravenous Once Jessee Avers, MD        Allergies: Allergies as of 08/16/2013 - Review Complete 08/16/2013  Allergen Reaction Noted  . Penicillins Hives 11/11/2010   Past Medical History  Diagnosis Date  . Gonorrhea 08/2011    Treated in 09/2011  . Diabetes mellitus 2001    Diagnosed at age 65 ; Type I   Past Surgical History  Procedure Laterality Date  . No past surgeries     Family History  Problem Relation Age of Onset  . Anesthesia problems Neg Hx   . Asthma Mother   . Gout Father   . Diabetes Paternal Grandmother    History   Social History  . Marital Status: Single    Spouse Name: N/A    Number of Children: 0  . Years of Education: 11th grade   Occupational History  . unemployed     has never worked  Social History Main Topics  . Smoking status: Never Smoker   . Smokeless tobacco: Never Used  . Alcohol Use: No  . Drug Use: No  . Sexual Activity: Yes    Birth Control/ Protection: None     Comment: As of 11/06/11 1 partner (female) not using protection   Other Topics Concern  . Not on file   Social History Narrative   Patient lives in [place] mother lives in [place].  Unemployed.  Previously worked for a IT consultant.  Completed 11 grade working on [employer]. Patient 3 brothers     Review of Systems: Review of Systems  Constitutional: Positive for malaise/fatigue. Negative for fever.  Respiratory: Negative for shortness of breath.   Cardiovascular: Negative for chest pain and leg swelling.  Gastrointestinal: Negative for nausea, vomiting, abdominal pain, diarrhea and  constipation.  Genitourinary: Negative for dysuria and frequency.  Musculoskeletal: Negative for falls.  Neurological: Negative for dizziness, loss of consciousness and headaches.    Physical Exam: Blood pressure 143/91, pulse 140, temperature 99.3 F (37.4 C), temperature source Rectal, resp. rate 30, height 5\' 5"  (1.651 m), weight 120 lb (54.432 kg), last menstrual period 07/17/2013, SpO2 100.00%. HR 110s, BP 130s/90s during exam General: somewhat alert, cooperative, lethargic HEENT: NCAT, vision grossly intact, oropharynx clear and non-erythematous, dry mm Neck: supple, no lymphadenopathy Lungs: tachypneic, clear to ascultation bilaterally, no wheezes, rales, ronchi Heart: tachycardic, regular, no murmurs, gallops, or rubs Abdomen: soft, non-tender, non-distended, normal bowel sounds Extremities: 2+ DP/PT pulses bilaterally, no cyanosis, clubbing, or edema Neurologic: alert & oriented X3, cranial nerves II-XII intact, strength grossly intact, sensation intact to light touch   Lab results: Basic Metabolic Panel:  Recent Labs  08/16/13 2235 08/17/13 0141  NA 121* 131*  K 5.8* 5.1  CL 75* 104  CO2 <7*  --   GLUCOSE 961* >700*  BUN 18 16  CREATININE 0.78 0.80  CALCIUM 11.1*  --    CBC:  Recent Labs  08/16/13 2235 08/17/13 0141  WBC 46.3*  --   NEUTROABS 39.3*  --   HGB 13.2 12.6  HCT 39.1 37.0  MCV 82.7  --   PLT 482*  --    Cardiac Enzymes:  Recent Labs  08/16/13 2235  TROPONINI <0.30   CBG:  Recent Labs  08/16/13 2228 08/17/13 0017 08/17/13 0121 08/17/13 0243  GLUCAP >600* >600* 568* 477*   Urinalysis:  Recent Labs  08/16/13 2339  COLORURINE YELLOW  LABSPEC 1.029  PHURINE 5.0  GLUCOSEU >1000*  HGBUR NEGATIVE  BILIRUBINUR NEGATIVE  KETONESUR >80*  PROTEINUR 30*  UROBILINOGEN 0.2  NITRITE NEGATIVE  LEUKOCYTESUR NEGATIVE    Imaging results:  Dg Chest 2 View  08/17/2013   CLINICAL DATA:  Hyperglycemia diabetes shortness of breath.   EXAM: CHEST  2 VIEW  COMPARISON:  08/07/2013  FINDINGS: The heart size and mediastinal contours are within normal limits. Both lungs are clear. The visualized skeletal structures are unremarkable.  IMPRESSION: No active cardiopulmonary disease.   Electronically Signed   By: Lucienne Capers M.D.   On: 08/17/2013 00:05    Other results: EKG: sinus tachycardia with rate in 130s  Assessment & Plan by Problem: #DKA in setting of uncontrolled DM1- A1C 19.3%. On admission, glucose 961, HCO3 <7, AG 39, + ketonuria.  ABG showed pH 7.16, pCO2 7.3, pO2 131, bicarb 2.5. Lactic acid 4.2.  Troponin x 1 negative.  Leucocytosis to 46.3, likely reactive.  No evidence of infection thus far.  CXR negative, UA  pending.  DKA very likely precipitated by lack of insulin as patient admits to taking no insulin since hospital discharge on 08/12/13 and has long history of noncompliance.  She is s/p NS 4L. She has a right femoral line in place.  Will attempt Foley insertion for fluid management as patient reportedly incontinent to urine in ED. -admit to IMTS step down on telemetry  -DKA protocol (continue insulin gtt, IVFs, NPO)  -additional NS 2L bolus then NS at 250 cc/hr  -bicarb IV 1.5 amps for now -NSS --> D5-0.5NSS once BG < 250  -BMPs q2h until AG closed x 2  -monitor K and replete as necessary  -start [place] insulin once AG closed x 2  -continue insulin drip for 2 hours after [place] insulin started  -UA pending -blood culture x 1 pending -consult to social work, diabetes educator   #DVT PPX- lovenox    Dispo: Disposition is deferred at this time, awaiting improvement of current medical problems. Anticipated discharge in approximately 2-3 day(s).   The patient does have a current PCP Cresenciano Genre, MD) and does need an [facility] hospital follow-up appointment after discharge.   Signed: Ivin Poot, MD 08/17/2013, 3:57 AM

## 2013-08-17 NOTE — Progress Notes (Signed)
Subjective: Patient seen at bedside this AM. Patient very tearful, unwilling to open eyes, generally uncooperative with exam. No fever, chills, nausea or vomiting.   Improved WBC count today, AG decreased to 28, HCO3 15. Lactic acid decreased to 1.3.  Objective: Vital signs in last 24 hours: Filed Vitals:   08/17/13 0900 08/17/13 0906 08/17/13 1000 08/17/13 1100  BP: 119/68  106/68 116/64  Pulse: 118  116 118  Temp:  100 F (37.8 C)    TempSrc:  Oral    Resp: 26  26 25   Height:      Weight:      SpO2: 100%  99% 99%   Weight change:   Intake/Output Summary (Last 24 hours) at 08/17/13 1150 Last data filed at 08/17/13 1100  Gross per 24 hour  Intake 2195.23 ml  Output   2300 ml  Net -104.77 ml   Physical Exam: General: Alert, uncooperative, tearful on exam.  HEENT: Unwilling to open eyes. Moist mucus membranes Neck: Full range of motion without pain, supple, no lymphadenopathy or carotid bruits Lungs: Clear to ascultation bilaterally, normal work of respiration, no wheezes, rales, rhonchi Heart: Tachycardic, regular rhythm, no murmurs, gallops, or rubs Abdomen: Soft, non-tender, non-distended, BS + GU: No active discharge, ulceration or abscess. Patient refused vaginal exam.  Extremities: No cyanosis, clubbing, or edema Neurologic: Alert & oriented X3, cranial nerves II-XII intact, strength grossly intact, sensation intact to light touch  Lab Results: Basic Metabolic Panel:  Recent Labs Lab 08/11/13 0520  08/17/13 0800 08/17/13 1020  NA 137  < > 140 139  K 4.2  < > 3.7 3.5*  CL 100  < > 107 106  CO2 20  < > 11* 15*  GLUCOSE 443*  < > 211* 181*  BUN 9  < > 9 7  CREATININE 0.40*  < > 0.48* 0.44*  CALCIUM 8.7  < > 8.2* 8.3*  MG 1.9  --   --   --   < > = values in this interval not displayed.  CBC:  Recent Labs Lab 08/16/13 2235  08/17/13 0455 08/17/13 0748  WBC 46.3*  --  45.0* 32.1*  NEUTROABS 39.3*  --   --  25.7*  HGB 13.2  < > 10.3* 9.3*  HCT 39.1   < > 30.5* 27.6*  MCV 82.7  --  83.6 81.7  PLT 482*  --  391 331  < > = values in this interval not displayed.  Cardiac Enzymes:  Recent Labs Lab 08/16/13 2235  TROPONINI <0.30   CBG:  Recent Labs Lab 08/17/13 0121 08/17/13 0243 08/17/13 0320 08/17/13 0435 08/17/13 0533 08/17/13 0636  GLUCAP 568* 477* 421* 286* 223* 215*   Urinalysis:  Recent Labs Lab 08/16/13 2339  COLORURINE YELLOW  LABSPEC 1.029  PHURINE 5.0  GLUCOSEU >1000*  HGBUR NEGATIVE  BILIRUBINUR NEGATIVE  KETONESUR >80*  PROTEINUR 30*  UROBILINOGEN 0.2  NITRITE NEGATIVE  LEUKOCYTESUR NEGATIVE    Micro Results: Recent Results (from the past 240 hour(s))  URINE CULTURE     Status: None   Collection Time    08/07/13  3:18 PM      Result Value Ref Range Status   Specimen Description URINE, CLEAN CATCH   Final   Special Requests NONE   Final   Culture  Setup Time     Final   Value: 08/07/2013 16:30     Performed at [place]     Final  Value: 30,000 COLONIES/ML     Performed at [facility]   Culture     Final   Value: YEAST     Performed at [facility]   Report Status 08/08/2013 FINAL   Final  MRSA PCR SCREENING     Status: None   Collection Time    08/07/13  6:11 PM      Result Value Ref Range Status   MRSA by PCR NEGATIVE  NEGATIVE Final   Comment:            The GeneXpert MRSA Assay (FDA     approved for NASAL specimens     only), is one component of a     comprehensive MRSA colonization     surveillance program. It is not     intended to diagnose MRSA     infection nor to guide or     monitor treatment for     MRSA infections.  CULTURE, BLOOD (ROUTINE X 2)     Status: None   Collection Time    08/07/13  7:59 PM      Result Value Ref Range Status   Specimen Description BLOOD LEFT ARM   Final   Special Requests BOTTLES DRAWN AEROBIC ONLY 10CC   Final   Culture  Setup Time     Final   Value: 08/08/2013 01:01     Performed at [facility]   Culture     Final   Value: NO GROWTH 5 DAYS     Performed at [facility]   Report Status 08/14/2013 FINAL   Final  CULTURE, BLOOD (ROUTINE X 2)     Status: None   Collection Time    08/07/13  8:32 PM      Result Value Ref Range Status   Specimen Description BLOOD RIGHT HAND   Final   Special Requests BOTTLES DRAWN AEROBIC ONLY 2CC   Final   Culture  Setup Time     Final   Value: 08/08/2013 01:01     Performed at [facility]   Culture     Final   Value: NO GROWTH 5 DAYS     Performed at [facility]   Report Status 08/14/2013 FINAL   Final  CULTURE, BLOOD (ROUTINE X 2)     Status: None   Collection Time    08/17/13  9:00 AM      Result Value Ref Range Status   Specimen Description BLOOD LEFT HAND   Final   Special Requests BOTTLES DRAWN AEROBIC ONLY [facility]   Final   Culture PENDING   Incomplete   Report Status PENDING   Incomplete   Studies/Results: Dg Chest 2 View  08/17/2013   CLINICAL DATA:  Hyperglycemia diabetes shortness of breath.  EXAM: CHEST  2 VIEW  COMPARISON:  08/07/2013  FINDINGS: The heart size and mediastinal contours are within normal limits. Both lungs are clear. The visualized skeletal structures are unremarkable.  IMPRESSION: No active cardiopulmonary disease.   Electronically Signed   By: Lucienne Capers M.D.   On: 08/17/2013 00:05   Medications: I have reviewed the patient's current medications. Scheduled Meds: . ceFEPime (MAXIPIME) IV  1 g Intravenous Q8H  . enoxaparin (LOVENOX) injection  40 mg Subcutaneous Q24H  . vancomycin  500 mg Intravenous Q8H   Continuous Infusions: . dextrose 5 % and 0.45% NaCl 125 mL/hr at 08/17/13 0345  . insulin (NOVOLIN-R) infusion 5.1 Units/hr (08/17/13 1056)  .  sodium bicarbonate 150 mEq in sterile water 1000 mL infusion 125 mL/hr at 08/17/13 0452   PRN Meds:.dextrose  Assessment/Plan: Ms. SHRESTA KOVACEVIC is a 23 y.o. female w/ PMHx of Type I DM w/ multiple admissions for  severe DKA, admitted for DKA.  Diabetic Ketoacidosis- Severely uncontrolled Type I DM, most recent HbA1c 19.3%. Patient generally unwell at home, brought to the hospital by mother. Mother claims she has not taken ANY insulin since her last admission (discharged on 08/10/13). On Admission, ABG showed pH 7.16, pCO2 7.3, pO2 131, HCO3 2.5. Also found to have a lactic acidosis initially, now resolved. Most recent BMP shows AG of 18, HCO3 of 15, blood glucose of 181. Still tearful on exam, uncooperative. Patient A&O x3.  -Continue Insulin gtt -D/c HCO3  -When appropriate, transition to Lantus 10 units + ISS-S -Continue D5 1/2NS @ 125 cc/hr for now (s/p ~7L) -Continue BMP q2h -Replete K -Zofran prn  Leukocytosis- Significant concern for initial leukocytosis of 46 on admission, now 32 on repeat. Most likely leukemoid reaction in the setting of DKA, however, some concern for infection. Started Vancomycin + Cefepime. Clinically, no sign of infection. -Continue Vanc/Cefepime for now -Repeat CBC in PM, if significant decrease, d/c ABx -GC/Chlamydia probe -Remove femoral line as soon as q2h labs are not needed -Blood cultures pending  DVT/PE PPx- Lovenox [place]  Dispo: Disposition is deferred at this time, awaiting improvement of current medical problems.  Anticipated discharge in approximately 2-3 day(s).   The patient does have a current PCP Cresenciano Genre, MD) and does need an [facility] hospital follow-up appointment after discharge.  The patient does not have transportation limitations that hinder transportation to clinic appointments.  .Services Needed at time of discharge: Y = Yes, Blank = No PT:   OT:   RN:   Equipment:   Other:     LOS: 1 day   Corky Sox, MD 08/17/2013, 11:50 AM

## 2013-08-17 NOTE — Progress Notes (Signed)
Inpatient Diabetes Program Recommendations  AACE/ADA: New Consensus Statement on Inpatient Glycemic Control (2013)  Target Ranges:  Prepandial:   less than 140 mg/dL      Peak postprandial:   less than 180 mg/dL (1-2 hours)      Critically ill patients:  140 - 180 mg/dL   Reason for Assessment:  Note patient has had 4 admissions in the past 4 months.  She was just here last week and per notes took no insulin after she left.  In the past when Diabetes Coordinator's have talked to patient she seems very resistant and seems to have poor coping mechanisms. A1C=19.3%.  Will follow.  Unsure how to help patient.  Concerned that she does not fully understand long term consequences of not properly caring for herself.  Diabetes history: Type 1 diabetes Outpatient Diabetes medications: At discharge last week patient was supposed to be taking Lantus 20 units daily and Novolog 10 units tid with meals.  Current orders for Inpatient glycemic control:  IV insulin/DKA order set    Will follow. Adah Perl, RN, BC-ADM Inpatient Diabetes Coordinator Pager [phone]

## 2013-08-17 NOTE — H&P (Signed)
INTERNAL MEDICINE TEACHING SERVICE Attending Admission Note  Date: 08/17/2013  Patient name: Ashley Freeman  Medical record number: [id number]  Date of birth: 1990/06/06    I have seen and evaluated Ashley Freeman and discussed their care with the Residency Team.  23 yr old woman with hx Type 1 DM presented with hyperglycemia and feeling sleepy. She admits to nonadherence with insulin. Denies fever, chills, CP, SOB, abdominal pain, N/V/D/C, dysuria. She continuously asks to be left alone with questioning. In the ED, she was noted to have significant evidence of DKA (high anion gap, metabolic acidosis). She was started on aggressive IVF resuscitation as well as IV insulin. Of note, she had a WBC of 45k on admission, but no evidence on exam of systemic infection.  Filed Vitals:   08/17/13 1100  BP: 116/64  Pulse: 118  Temp:   Resp: 25  T 100.0 F  GEN: AAOx3, no distress, continuously asking for her mom. HEENT: dry mucosa, EOMI. CV: S1S2, no m/r/g, tachy. PULM: CTA bilat ABD/GI: Soft, NT, +BS, no guarding. LE/UE: 2+ pulses, no c/c/e. NEURO: no focal deficits.  -DKA: Repeat BMP and CBC are encouraging. Her AG is improving (18 this morning) and her WBC is decreasing. BC were obtained. No evidence of infection on exam, but initial WBC was concerning. Of course, this could be a leukemoid reaction. I asked team to give initial broad spectrum coverage. Given quickly decreasing WBC to 32, this is much more encouraging. Would hold off on further IV abx and follow her clinically. Continue volume resuscitation and insulin gtt.  Dominic Pea, DO, [facility] 08/17/2013, 11:29 AM

## 2013-08-17 NOTE — Progress Notes (Signed)
ANTIBIOTIC CONSULT NOTE - INITIAL  Pharmacy Consult for vancomycin + cefepime Indication: empiric for leukocytosis  Allergies  Allergen Reactions  . Penicillins Hives    Patient Measurements: Height: 5\' 5"  (165.1 cm) Weight: 120 lb (54.432 kg) IBW/kg (Calculated) : 57 Adjusted Body Weight:   Vital Signs: Temp: 100 F (37.8 C) (04/20 0906) Temp src: Oral (04/20 0906) BP: 116/70 mmHg (04/20 0600) Pulse Rate: 110 (04/20 0600) Intake/Output from previous day: 04/19 0701 - 04/20 0700 In: 845.2 [I.V.:845.2] Out: 2300 [Urine:2300] Intake/Output from this shift:    Labs:  Recent Labs  08/16/13 2235 08/17/13 0141 08/17/13 0412 08/17/13 0455 08/17/13 0530 08/17/13 0800  WBC 46.3*  --   --  45.0*  --   --   HGB 13.2 12.6  --  10.3*  --   --   PLT 482*  --   --  391  --   --   CREATININE 0.78 0.80 0.59  --  0.49* 0.48*   Estimated Creatinine Clearance: 95.5 ml/min (by C-G formula based on Cr of 0.48). No results found for this basename: Letta Median, VANCORANDOM, GENTTROUGH, GENTPEAK, GENTRANDOM, TOBRATROUGH, TOBRAPEAK, TOBRARND, AMIKACINPEAK, AMIKACINTROU, AMIKACIN,  in the last 72 hours   Microbiology: Recent Results (from the past 720 hour(s))  URINE CULTURE     Status: None   Collection Time    08/07/13  3:18 PM      Result Value Ref Range Status   Specimen Description URINE, CLEAN CATCH   Final   Special Requests NONE   Final   Culture  Setup Time     Final   Value: 08/07/2013 16:30     Performed at [place]     Final   Value: 30,000 COLONIES/ML     Performed at [facility]   Culture     Final   Value: YEAST     Performed at [facility]   Report Status 08/08/2013 FINAL   Final  MRSA PCR SCREENING     Status: None   Collection Time    08/07/13  6:11 PM      Result Value Ref Range Status   MRSA by PCR NEGATIVE  NEGATIVE Final   Comment:            The GeneXpert MRSA Assay (FDA     approved for NASAL  specimens     only), is one component of a     comprehensive MRSA colonization     surveillance program. It is not     intended to diagnose MRSA     infection nor to guide or     monitor treatment for     MRSA infections.  CULTURE, BLOOD (ROUTINE X 2)     Status: None   Collection Time    08/07/13  7:59 PM      Result Value Ref Range Status   Specimen Description BLOOD LEFT ARM   Final   Special Requests BOTTLES DRAWN AEROBIC ONLY 10CC   Final   Culture  Setup Time     Final   Value: 08/08/2013 01:01     Performed at [facility]   Culture     Final   Value: NO GROWTH 5 DAYS     Performed at [facility]   Report Status 08/14/2013 FINAL   Final  CULTURE, BLOOD (ROUTINE X 2)     Status: None   Collection Time    08/07/13  8:32 PM      Result Value Ref Range Status   Specimen Description BLOOD RIGHT HAND   Final   Special Requests BOTTLES DRAWN AEROBIC ONLY 2CC   Final   Culture  Setup Time     Final   Value: 08/08/2013 01:01     Performed at [facility]   Culture     Final   Value: NO GROWTH 5 DAYS     Performed at [facility]   Report Status 08/14/2013 FINAL   Final    Medical History: Past Medical History  Diagnosis Date  . Gonorrhea 08/2011    Treated in 09/2011  . Diabetes mellitus 2001    Diagnosed at age 23 ; Type I    Medications:  Anti-infectives   Start     Dose/Rate Route Frequency Ordered Stop   08/17/13 1000  ceFEPIme (MAXIPIME) 1 g in dextrose 5 % 50 mL IVPB     1 g 100 mL/hr over 30 Minutes Intravenous Every 8 hours 08/17/13 0925     08/17/13 0900  vancomycin (VANCOCIN) 500 mg in sodium chloride 0.9 % 100 mL IVPB     500 mg 100 mL/hr over 60 Minutes Intravenous Every 8 hours 08/17/13 0803       Assessment: 23 yof presented to the hospital with DKA. Pt with impressive leukocytosis, 45. Pt is afebrile. To start empiric vancomycin + cefepime (PCN allergy but has tolerated keflex in the past).   Vanc 4/20>> Cefepime  4/20>>  Goal of Therapy:  Vancomycin trough level 15-20 mcg/ml  Plan:  1. Vancomycin 500mg  IV Q8H 2. Cefepime 1gm IV Q8H 3. F/u renal fxn, C&S, clinical status and trough at [place] 08/17/2013,9:26 AM

## 2013-08-17 NOTE — Progress Notes (Signed)
UR Completed.  Ashley Freeman Ashley Freeman [phone] 08/17/2013  

## 2013-08-17 NOTE — Progress Notes (Signed)
Called admitting MD to ensure NS@200ml /hr was correct. MD verbal order to change NS to @125  along with bicarbonate@125  and D51/2NS@125 .

## 2013-08-17 NOTE — Progress Notes (Signed)
Urinary catheter ordered- patient refused. MD made aware.

## 2013-08-17 NOTE — Discharge Summary (Signed)
Internal Medicine Attending  Date: 08/17/2013  Patient name: Ashley Freeman Medical record number: [id number] Date of birth: April 02, 1991 Age: 23 y.o. Gender: female  Additional Comment:  Given the single run of asymptomatic NSVT, further workup should include a 2D echocardiogram to rule out structural heart disease, and consideration of a stress study.

## 2013-08-17 NOTE — Progress Notes (Signed)
Pt's mom expressed concern that the pt was too incompetent to properly care for herself and was interested in a method of her having a "guardian". The message was passed along to teaching services and the possibility of a psych consult was discussed.

## 2013-08-17 NOTE — ED Provider Notes (Addendum)
Shared service with midlevel provider. I have personally seen and examined the patient, providing direct face to face care, presenting with the chief complaint of elevated blood sugar. Physical exam findings include, no abd pain on exam, tachycardia and tachypnea, but normal cardiopulmonary exam, no rash. Pt's lab shows metabolic acidosis, with profound anion gap. Pt also has leukocytosis with bandemia - which we suspect is due to DKA, and not infection. Blood cultures sent. Pt has hx of non compliance, and this appears to be DKA due to non compliance. Pt only had 1 cline - 22 gauge, so a central line was placed. Because patient is slightly combative, we decided to put a femoral line, so that we can restrain her whilst putting in the central line. Pt tolerated the procedure well. Plan will be initiating DKA protocol, with ivf and insulin and admit to step down icu. I have reviewed the nursing documentation on past medical history, family history, and social history.   CENTRAL LINE Performed by: Vikkie Goeden Consent: The procedure was performed in an emergent situation. Required items: required blood products, implants, devices, and special equipment available Patient identity confirmed: arm band and provided demographic data Time out: Immediately prior to procedure a "time out" was called to verify the correct patient, procedure, equipment, support staff and site/side marked as required. Indications: vascular access Anesthesia: local infiltration Local anesthetic: lidocaine 1% with epinephrine Anesthetic total: 3 ml Patient sedated: no Preparation: skin prepped with 2% chlorhexidine Skin prep agent dried: skin prep agent completely dried prior to procedure Sterile barriers: all five maximum sterile barriers used - cap, mask, sterile gown, sterile gloves, and large sterile sheet Hand hygiene: hand hygiene performed prior to central venous catheter insertion  Location details: left femoral  central line  Catheter type: triple lumen Catheter size: 8 Fr Pre-procedure: landmarks identified Ultrasound guidance: No Successful placement: yes Post-procedure: line sutured and dressing applied Assessment: blood return through all parts, free fluid flow, placement verified by x-ray and no pneumothorax on x-ray Patient tolerance: Patient tolerated the procedure. She had a small hematoma, from accidental arterial entry. Pressure applied for 3 minutes prior to 2nd attempt, which was successful.   CRITICAL CARE Performed by: Pearly Apachito   Total critical care time: 40 minutes  Critical care time was exclusive of separately billable procedures and treating other patients.  Critical care was necessary to treat or prevent imminent or life-threatening deterioration.  Critical care was time spent personally by me on the following activities: development of treatment plan with patient and/or surrogate as well as nursing, discussions with consultants, evaluation of patient's response to treatment, examination of patient, obtaining history from patient or surrogate, ordering and performing treatments and interventions, ordering and review of laboratory studies, ordering and review of radiographic studies, pulse oximetry and re-evaluation of patient's condition.   Varney Biles, MD 08/17/13 [id number]  Varney Biles, MD 08/17/13 [id number]

## 2013-08-18 ENCOUNTER — Encounter (HOSPITAL_COMMUNITY)
Admission: EM | Disposition: A | Discharge status: Home Health | Payer: Self-pay | Source: Home / Self Care | Attending: Internal Medicine

## 2013-08-18 ENCOUNTER — Inpatient Hospital Stay (HOSPITAL_COMMUNITY): Payer: Medicaid Other | Admitting: Anesthesiology

## 2013-08-18 ENCOUNTER — Encounter (HOSPITAL_COMMUNITY): Payer: Self-pay | Admitting: Anesthesiology

## 2013-08-18 ENCOUNTER — Encounter (HOSPITAL_COMMUNITY): Payer: Medicaid Other | Admitting: Anesthesiology

## 2013-08-18 DIAGNOSIS — D72829 Elevated white blood cell count, unspecified: Secondary | ICD-10-CM

## 2013-08-18 DIAGNOSIS — E109 Type 1 diabetes mellitus without complications: Secondary | ICD-10-CM

## 2013-08-18 DIAGNOSIS — F4322 Adjustment disorder with anxiety: Secondary | ICD-10-CM

## 2013-08-18 DIAGNOSIS — L0231 Cutaneous abscess of buttock: Secondary | ICD-10-CM

## 2013-08-18 DIAGNOSIS — E1010 Type 1 diabetes mellitus with ketoacidosis without coma: Secondary | ICD-10-CM

## 2013-08-18 DIAGNOSIS — L03317 Cellulitis of buttock: Secondary | ICD-10-CM

## 2013-08-18 HISTORY — PX: INCISION AND DRAINAGE PERIRECTAL ABSCESS: SHX1804

## 2013-08-18 LAB — GLUCOSE, CAPILLARY
GLUCOSE-CAPILLARY: 111 mg/dL — AB (ref 70–99)
GLUCOSE-CAPILLARY: 169 mg/dL — AB (ref 70–99)
GLUCOSE-CAPILLARY: 93 mg/dL (ref 70–99)
Glucose-Capillary: 113 mg/dL — ABNORMAL HIGH (ref 70–99)
Glucose-Capillary: 72 mg/dL (ref 70–99)
Glucose-Capillary: 128 mg/dL — ABNORMAL HIGH (ref 70–99)
Glucose-Capillary: 162 mg/dL — ABNORMAL HIGH (ref 70–99)
Glucose-Capillary: 164 mg/dL — ABNORMAL HIGH (ref 70–99)
Glucose-Capillary: 148 mg/dL — ABNORMAL HIGH (ref 70–99)
Glucose-Capillary: 173 mg/dL — ABNORMAL HIGH (ref 70–99)
Glucose-Capillary: 159 mg/dL — ABNORMAL HIGH (ref 70–99)
Glucose-Capillary: 175 mg/dL — ABNORMAL HIGH (ref 70–99)
Glucose-Capillary: 176 mg/dL — ABNORMAL HIGH (ref 70–99)
Glucose-Capillary: 156 mg/dL — ABNORMAL HIGH (ref 70–99)
Glucose-Capillary: 129 mg/dL — ABNORMAL HIGH (ref 70–99)
Glucose-Capillary: 255 mg/dL — ABNORMAL HIGH (ref 70–99)
Glucose-Capillary: 400 mg/dL — ABNORMAL HIGH (ref 70–99)
Glucose-Capillary: 320 mg/dL — ABNORMAL HIGH (ref 70–99)
Glucose-Capillary: 242 mg/dL — ABNORMAL HIGH (ref 70–99)
Glucose-Capillary: 127 mg/dL — ABNORMAL HIGH (ref 70–99)

## 2013-08-18 LAB — BASIC METABOLIC PANEL WITH GFR
BUN: 3 mg/dL — ABNORMAL LOW (ref 6–23)
BUN: 5 mg/dL — ABNORMAL LOW (ref 6–23)
BUN: 4 mg/dL — ABNORMAL LOW (ref 6–23)
CO2: 19 meq/L (ref 19–32)
CO2: 14 meq/L — ABNORMAL LOW (ref 19–32)
CO2: 15 meq/L — ABNORMAL LOW (ref 19–32)
Calcium: 8.2 mg/dL — ABNORMAL LOW (ref 8.4–10.5)
Calcium: 8.3 mg/dL — ABNORMAL LOW (ref 8.4–10.5)
Calcium: 8.6 mg/dL (ref 8.4–10.5)
Chloride: 101 meq/L (ref 96–112)
Chloride: 96 meq/L (ref 96–112)
Chloride: 99 meq/L (ref 96–112)
Creatinine, Ser: 0.43 mg/dL — ABNORMAL LOW (ref 0.50–1.10)
Creatinine, Ser: 0.43 mg/dL — ABNORMAL LOW (ref 0.50–1.10)
Creatinine, Ser: 0.46 mg/dL — ABNORMAL LOW (ref 0.50–1.10)
GFR calc Af Amer: >90 mL/min
GFR calc Af Amer: >90 mL/min
GFR calc Af Amer: >90 mL/min
GFR calc non Af Amer: >90 mL/min
GFR calc non Af Amer: >90 mL/min
GFR calc non Af Amer: >90 mL/min
Glucose, Bld: 152 mg/dL — ABNORMAL HIGH (ref 70–99)
Glucose, Bld: 421 mg/dL — ABNORMAL HIGH (ref 70–99)
Glucose, Bld: 255 mg/dL — ABNORMAL HIGH (ref 70–99)
Potassium: 3.3 meq/L — ABNORMAL LOW (ref 3.7–5.3)
Potassium: 4.3 meq/L (ref 3.7–5.3)
Potassium: 3.6 meq/L — ABNORMAL LOW (ref 3.7–5.3)
Sodium: 134 meq/L — ABNORMAL LOW (ref 137–147)
Sodium: 130 meq/L — ABNORMAL LOW (ref 137–147)
Sodium: 134 meq/L — ABNORMAL LOW (ref 137–147)

## 2013-08-18 LAB — BASIC METABOLIC PANEL
BUN: 3 mg/dL — ABNORMAL LOW (ref 6–23)
BUN: 3 mg/dL — ABNORMAL LOW (ref 6–23)
BUN: 4 mg/dL — ABNORMAL LOW (ref 6–23)
BUN: 4 mg/dL — ABNORMAL LOW (ref 6–23)
BUN: 4 mg/dL — ABNORMAL LOW (ref 6–23)
BUN: 4 mg/dL — ABNORMAL LOW (ref 6–23)
BUN: 5 mg/dL — AB (ref 6–23)
BUN: 5 mg/dL — ABNORMAL LOW (ref 6–23)
CALCIUM: 8.1 mg/dL — AB (ref 8.4–10.5)
CALCIUM: 8.2 mg/dL — AB (ref 8.4–10.5)
CALCIUM: 8.3 mg/dL — AB (ref 8.4–10.5)
CALCIUM: 8.1 mg/dL — AB (ref 8.4–10.5)
CHLORIDE: 102 meq/L (ref 96–112)
CO2: 15 mEq/L — ABNORMAL LOW (ref 19–32)
CO2: 18 mEq/L — ABNORMAL LOW (ref 19–32)
CO2: 18 mEq/L — ABNORMAL LOW (ref 19–32)
CO2: 18 mEq/L — ABNORMAL LOW (ref 19–32)
CO2: 18 mEq/L — ABNORMAL LOW (ref 19–32)
CO2: 19 mEq/L (ref 19–32)
CO2: 19 mEq/L (ref 19–32)
CO2: 19 mEq/L (ref 19–32)
CREATININE: 0.44 mg/dL — AB (ref 0.50–1.10)
Calcium: 8.2 mg/dL — ABNORMAL LOW (ref 8.4–10.5)
Calcium: 8.3 mg/dL — ABNORMAL LOW (ref 8.4–10.5)
Calcium: 8.5 mg/dL (ref 8.4–10.5)
Calcium: 8.6 mg/dL (ref 8.4–10.5)
Chloride: 101 mEq/L (ref 96–112)
Chloride: 102 mEq/L (ref 96–112)
Chloride: 102 mEq/L (ref 96–112)
Chloride: 103 mEq/L (ref 96–112)
Chloride: 103 mEq/L (ref 96–112)
Chloride: 103 mEq/L (ref 96–112)
Chloride: 104 mEq/L (ref 96–112)
Creatinine, Ser: 0.37 mg/dL — ABNORMAL LOW (ref 0.50–1.10)
Creatinine, Ser: 0.37 mg/dL — ABNORMAL LOW (ref 0.50–1.10)
Creatinine, Ser: 0.37 mg/dL — ABNORMAL LOW (ref 0.50–1.10)
Creatinine, Ser: 0.44 mg/dL — ABNORMAL LOW (ref 0.50–1.10)
Creatinine, Ser: 0.45 mg/dL — ABNORMAL LOW (ref 0.50–1.10)
Creatinine, Ser: 0.46 mg/dL — ABNORMAL LOW (ref 0.50–1.10)
Creatinine, Ser: 0.48 mg/dL — ABNORMAL LOW (ref 0.50–1.10)
GFR calc Af Amer: >90 mL/min (ref >90)
GFR calc Af Amer: >90 mL/min (ref >90)
GFR calc Af Amer: >90 mL/min (ref >90)
GFR calc non Af Amer: >90 mL/min (ref >90)
GFR calc non Af Amer: >90 mL/min (ref >90)
GFR calc non Af Amer: >90 mL/min (ref >90)
GLUCOSE: 174 mg/dL — AB (ref 70–99)
GLUCOSE: 145 mg/dL — AB (ref 70–99)
Glucose, Bld: 104 mg/dL — ABNORMAL HIGH (ref 70–99)
Glucose, Bld: 151 mg/dL — ABNORMAL HIGH (ref 70–99)
Glucose, Bld: 160 mg/dL — ABNORMAL HIGH (ref 70–99)
Glucose, Bld: 160 mg/dL — ABNORMAL HIGH (ref 70–99)
Glucose, Bld: 170 mg/dL — ABNORMAL HIGH (ref 70–99)
Glucose, Bld: 71 mg/dL (ref 70–99)
POTASSIUM: 3.7 meq/L (ref 3.7–5.3)
POTASSIUM: 3.7 meq/L (ref 3.7–5.3)
POTASSIUM: 3.3 meq/L — AB (ref 3.7–5.3)
POTASSIUM: 3.6 meq/L — AB (ref 3.7–5.3)
Potassium: 3.4 mEq/L — ABNORMAL LOW (ref 3.7–5.3)
Potassium: 3.8 mEq/L (ref 3.7–5.3)
Potassium: 3.3 mEq/L — ABNORMAL LOW (ref 3.7–5.3)
Potassium: 4.1 mEq/L (ref 3.7–5.3)
SODIUM: 136 meq/L — AB (ref 137–147)
SODIUM: 134 meq/L — AB (ref 137–147)
SODIUM: 135 meq/L — AB (ref 137–147)
Sodium: 133 mEq/L — ABNORMAL LOW (ref 137–147)
Sodium: 134 mEq/L — ABNORMAL LOW (ref 137–147)
Sodium: 134 mEq/L — ABNORMAL LOW (ref 137–147)
Sodium: 135 mEq/L — ABNORMAL LOW (ref 137–147)
Sodium: 135 mEq/L — ABNORMAL LOW (ref 137–147)

## 2013-08-18 LAB — URINE CULTURE
COLONY COUNT: NO GROWTH
Culture: NO GROWTH

## 2013-08-18 LAB — MAGNESIUM
Magnesium: 2.5 mg/dL (ref 1.5–2.5)
Magnesium: 2.1 mg/dL (ref 1.5–2.5)

## 2013-08-18 LAB — CBC
HCT: 26 % — ABNORMAL LOW (ref 36.0–46.0)
Hemoglobin: 9.1 g/dL — ABNORMAL LOW (ref 12.0–15.0)
MCH: 27.8 pg (ref 26.0–34.0)
MCHC: 35 g/dL (ref 30.0–36.0)
MCV: 79.5 fL (ref 78.0–100.0)
Platelets: 326 10*3/uL (ref 150–400)
RBC: 3.27 MIL/uL — ABNORMAL LOW (ref 3.87–5.11)
RDW: 12.8 % (ref 11.5–15.5)
WBC: 25.3 10*3/uL — AB (ref 4.0–10.5)

## 2013-08-18 LAB — PHOSPHORUS
Phosphorus: <0.5 mg/dL — CL (ref 2.3–4.6)
Phosphorus: 0.6 mg/dL — CL (ref 2.3–4.6)

## 2013-08-18 LAB — GC/CHLAMYDIA PROBE AMP
CT Probe RNA: NEGATIVE
GC PROBE AMP APTIMA: NEGATIVE

## 2013-08-18 SURGERY — INCISION AND DRAINAGE, ABSCESS, PERIRECTAL
Anesthesia: General | Site: Buttocks | Laterality: Right

## 2013-08-18 MED ORDER — KCL IN DEXTROSE-NACL 20-5-0.45 MEQ/L-%-% IV SOLN
INTRAVENOUS | Status: DC
  Administered 2013-08-18: 13:00:00 via INTRAVENOUS
  Filled 2013-08-18 (×4): qty 1000

## 2013-08-18 MED ORDER — POTASSIUM CHLORIDE 10 MEQ/50ML IV SOLN
10.0000 meq | INTRAVENOUS | Status: AC
  Administered 2013-08-18 (×4): 10 meq via INTRAVENOUS
  Filled 2013-08-18: qty 50

## 2013-08-18 MED ORDER — METRONIDAZOLE IN NACL 5-0.79 MG/ML-% IV SOLN
500.0000 mg | Freq: Three times a day (TID) | INTRAVENOUS | Status: DC
  Administered 2013-08-18 – 2013-08-20 (×5): 500 mg via INTRAVENOUS
  Filled 2013-08-18 (×9): qty 100

## 2013-08-18 MED ORDER — MIDAZOLAM HCL 2 MG/2ML IJ SOLN
INTRAMUSCULAR | Status: AC
  Filled 2013-08-18: qty 2

## 2013-08-18 MED ORDER — FENTANYL CITRATE 0.05 MG/ML IJ SOLN
INTRAMUSCULAR | Status: DC | PRN
  Administered 2013-08-18 (×5): 50 ug via INTRAVENOUS

## 2013-08-18 MED ORDER — OXYCODONE HCL 5 MG PO TABS: 5.0000 mg | ORAL_TABLET | Freq: Once | ORAL | Status: AC | PRN

## 2013-08-18 MED ORDER — PROPOFOL 10 MG/ML IV BOLUS
INTRAVENOUS | Status: AC
  Filled 2013-08-18: qty 20

## 2013-08-18 MED ORDER — POTASSIUM CHLORIDE IN NACL 20-0.9 MEQ/L-% IV SOLN
INTRAVENOUS | Status: DC | PRN
  Administered 2013-08-18: 125 mL/h via INTRAVENOUS

## 2013-08-18 MED ORDER — ONDANSETRON HCL 4 MG/2ML IJ SOLN
INTRAMUSCULAR | Status: DC | PRN
  Administered 2013-08-18: 4 mg via INTRAVENOUS

## 2013-08-18 MED ORDER — PROPOFOL 10 MG/ML IV BOLUS
INTRAVENOUS | Status: DC | PRN
  Administered 2013-08-18: 20 mg via INTRAVENOUS

## 2013-08-18 MED ORDER — POTASSIUM CHLORIDE CRYS ER 20 MEQ PO TBCR
40.0000 meq | EXTENDED_RELEASE_TABLET | Freq: Once | ORAL | Status: AC
  Administered 2013-08-18: 40 meq via ORAL
  Filled 2013-08-18: qty 2

## 2013-08-18 MED ORDER — POTASSIUM CHLORIDE CRYS ER 20 MEQ PO TBCR: 40.0000 meq | EXTENDED_RELEASE_TABLET | Freq: Once | ORAL | Status: DC

## 2013-08-18 MED ORDER — HYDROMORPHONE HCL PF 1 MG/ML IJ SOLN
0.2500 mg | INTRAMUSCULAR | Status: DC | PRN
  Administered 2013-08-18 – 2013-08-19 (×3): 0.5 mg via INTRAVENOUS
  Filled 2013-08-18 (×3): qty 1

## 2013-08-18 MED ORDER — SODIUM CHLORIDE 0.9 % IV SOLN
INTRAVENOUS | Status: DC
  Filled 2013-08-18: qty 1

## 2013-08-18 MED ORDER — CIPROFLOXACIN IN D5W 400 MG/200ML IV SOLN
400.0000 mg | Freq: Two times a day (BID) | INTRAVENOUS | Status: DC
  Administered 2013-08-18 – 2013-08-20 (×4): 400 mg via INTRAVENOUS
  Filled 2013-08-18 (×6): qty 200

## 2013-08-18 MED ORDER — LACTATED RINGERS IV SOLN
INTRAVENOUS | Status: DC | PRN
  Administered 2013-08-18: 19:00:00 via INTRAVENOUS

## 2013-08-18 MED ORDER — DIPHENHYDRAMINE HCL 50 MG/ML IJ SOLN
INTRAMUSCULAR | Status: AC
  Administered 2013-08-18: 12.5 mg via INTRAVENOUS
  Filled 2013-08-18: qty 1

## 2013-08-18 MED ORDER — ARTIFICIAL TEARS OP OINT
TOPICAL_OINTMENT | OPHTHALMIC | Status: DC | PRN
Start: 2013-08-18 — End: 2013-08-18
  Administered 2013-08-18: 1 via OPHTHALMIC

## 2013-08-18 MED ORDER — OXYCODONE HCL 5 MG/5ML PO SOLN: 5.0000 mg | Freq: Once | ORAL | Status: AC | PRN

## 2013-08-18 MED ORDER — INSULIN REGULAR HUMAN 100 UNIT/ML IJ SOLN
100.0000 [IU] | INTRAMUSCULAR | Status: DC | PRN
  Administered 2013-08-18: 5.5 [IU]/h via INTRAVENOUS

## 2013-08-18 MED ORDER — DIPHENHYDRAMINE HCL 50 MG/ML IJ SOLN
12.5000 mg | Freq: Once | INTRAMUSCULAR | Status: AC
  Administered 2013-08-18: 12.5 mg via INTRAVENOUS

## 2013-08-18 MED ORDER — VANCOMYCIN HCL 1000 MG IV SOLR
1000.0000 mg | INTRAVENOUS | Status: DC | PRN
  Administered 2013-08-18: 500 mg via INTRAVENOUS

## 2013-08-18 MED ORDER — 0.9 % SODIUM CHLORIDE (POUR BTL) OPTIME
TOPICAL | Status: DC | PRN
  Administered 2013-08-18: 1000 mL

## 2013-08-18 MED ORDER — PHENYLEPHRINE HCL 10 MG/ML IJ SOLN
INTRAMUSCULAR | Status: DC | PRN
  Administered 2013-08-18: 40 ug via INTRAVENOUS

## 2013-08-18 MED ORDER — FENTANYL CITRATE 0.05 MG/ML IJ SOLN
INTRAMUSCULAR | Status: AC
  Filled 2013-08-18: qty 5

## 2013-08-18 MED ORDER — DEXTROSE 5 % IV SOLN
30.0000 mmol | Freq: Once | INTRAVENOUS | Status: AC
  Administered 2013-08-18: 30 mmol via INTRAVENOUS
  Filled 2013-08-18: qty 10

## 2013-08-18 SURGICAL SUPPLY — 34 items
BLADE 15 SAFETY STRL DISP (BLADE) ×3 IMPLANT
CANISTER SUCTION 2500CC (MISCELLANEOUS) ×3 IMPLANT
COVER MAYO STAND STRL (DRAPES) ×3 IMPLANT
COVER SURGICAL LIGHT HANDLE (MISCELLANEOUS) ×3 IMPLANT
DRAPE UTILITY 15X26 W/TAPE STR (DRAPE) ×12 IMPLANT
DRSG PAD ABDOMINAL 8X10 ST (GAUZE/BANDAGES/DRESSINGS) ×3 IMPLANT
ELECT CAUTERY BLADE 6.4 (BLADE) IMPLANT
ELECT REM PT RETURN 9FT ADLT (ELECTROSURGICAL) ×3
ELECTRODE REM PT RTRN 9FT ADLT (ELECTROSURGICAL) ×1 IMPLANT
GAUZE PACKING IODOFORM 1 (PACKING) ×3 IMPLANT
GLOVE BIO SURGEON STRL SZ7.5 (GLOVE) ×3 IMPLANT
GLOVE BIOGEL PI IND STRL 8 (GLOVE) ×1 IMPLANT
GLOVE BIOGEL PI INDICATOR 8 (GLOVE) ×2
GOWN STRL REUS W/ TWL LRG LVL3 (GOWN DISPOSABLE) ×1 IMPLANT
GOWN STRL REUS W/ TWL XL LVL3 (GOWN DISPOSABLE) ×1 IMPLANT
GOWN STRL REUS W/TWL LRG LVL3 (GOWN DISPOSABLE) ×2
GOWN STRL REUS W/TWL XL LVL3 (GOWN DISPOSABLE) ×2
KIT BASIN OR (CUSTOM PROCEDURE TRAY) ×3 IMPLANT
KIT ROOM TURNOVER OR (KITS) ×3 IMPLANT
NS IRRIG 1000ML POUR BTL (IV SOLUTION) ×3 IMPLANT
PACK LITHOTOMY IV (CUSTOM PROCEDURE TRAY) ×3 IMPLANT
PAD ARMBOARD 7.5X6 YLW CONV (MISCELLANEOUS) ×3 IMPLANT
PENCIL BUTTON HOLSTER BLD 10FT (ELECTRODE) ×3 IMPLANT
SPONGE GAUZE 4X4 12PLY (GAUZE/BANDAGES/DRESSINGS) IMPLANT
SPONGE LAP 18X18 X RAY DECT (DISPOSABLE) ×3 IMPLANT
SWAB COLLECTION DEVICE MRSA (MISCELLANEOUS) IMPLANT
SYR BULB IRRIGATION 50ML (SYRINGE) ×3 IMPLANT
TOWEL OR 17X24 6PK STRL BLUE (TOWEL DISPOSABLE) ×3 IMPLANT
TOWEL OR 17X26 10 PK STRL BLUE (TOWEL DISPOSABLE) ×3 IMPLANT
TUBE ANAEROBIC SPECIMEN COL (MISCELLANEOUS) IMPLANT
TUBE CONNECTING 12'X1/4 (SUCTIONS) ×1
TUBE CONNECTING 12X1/4 (SUCTIONS) ×2 IMPLANT
UNDERPAD 30X30 INCONTINENT (UNDERPADS AND DIAPERS) ×3 IMPLANT
YANKAUER SUCT BULB TIP NO VENT (SUCTIONS) ×3 IMPLANT

## 2013-08-18 NOTE — Op Note (Signed)
08/16/2013 - 08/18/2013  7:43 PM  PATIENT:  Ashley Freeman  23 y.o. female  PRE-OPERATIVE DIAGNOSIS:  Gluteal Abscess  POST-OPERATIVE DIAGNOSIS:  Right Gluteal abscess  PROCEDURE:  Procedure(s): Incision and drainage of GLUTEAL ABSCESS (Right)  SURGEON:  Surgeon(s) and Role:    * Ralene Ok, MD - Primary  PHYSICIAN ASSISTANT:   ASSISTANTS: none   ANESTHESIA:   general  EBL:   5 cc  BLOOD ADMINISTERED:none  DRAINS: none   LOCAL MEDICATIONS USED:  NONE  SPECIMEN:  Source of Specimen:  Microbiology: Right renal abscess  DISPOSITION OF SPECIMEN:  microbiology  COUNTS:  YES  TOURNIQUET:  * No tourniquets in log *  DICTATION: .Dragon Dictation The patient was taken back to the operating room and placed in the lithotomy position bilateral SCDs in place. After appropriate by confirming Timeout was called and all facts were verified.  A 2 x 3 cm elliptical incision was made over the area of greatest fluctuance. A large amount of purulence drained from the right gluteal abscess. Cultures were taken both aerobic and anaerobic. The area was completely drained all loculations were broken up bluntly. The area was then irrigated out with sterile saline. The area was then packed with one-inch iodoform gauze. The wound was then dressed with 4 x 4's, ABDs pads, and mesh panties. The patient was awakened from general anesthesia and was taken to the recovery room in stable condition. The patient tolerated the procedure well.  PLAN OF CARE: already an inpatient  PATIENT DISPOSITION:  PACU - hemodynamically stable.   Delay start of Pharmacological VTE agent (>24hrs) due to surgical blood loss or risk of bleeding: not applicable

## 2013-08-18 NOTE — Progress Notes (Signed)
Inpatient Diabetes Program Recommendations  AACE/ADA: New Consensus Statement on Inpatient Glycemic Control (2013)  Target Ranges:  Prepandial:   less than 140 mg/dL      Peak postprandial:   less than 180 mg/dL (1-2 hours)      Critically ill patients:  140 - 180 mg/dL   Reason for Visit: DKA/Type 1 diabetes. Note patient just discharged on 08/11/13.  Went in to talk to patient.  Asked patient about her diabetes and she began to cry stating "I want to go home", "I'm hungry" and "I want my Mom".  She states that "I do take my insulin".  After discussing with patient further she admits that she did not get her insulin at the last discharge stating that her "Medicaid would not cover it".  Case manager also present in room.  Briefly attempted to explain to patient that when her CBG's are running in the 400-500 range, she does not have energy or feel well.  Patient's behavior childlike and she was unable to discuss any specifics regarding her diabetes care.  Will be glad to speak with Mom when she comes today along with case manger.  Case manager states that she will also have social worker speak with patient.   Adah Perl, RN, BC-ADM Inpatient Diabetes Coordinator Pager [phone]

## 2013-08-18 NOTE — Progress Notes (Signed)
CBG 127. Insulin drip decreased to 2 units/hr per glucostabilizer

## 2013-08-18 NOTE — Consult Note (Signed)
[facility] Face-to-Face Psychiatry Consult   Reason for Consult:  Capacity Referring Physician:  Dr Zoila Shutter is an 23 y.o. female. Total Time spent with patient: 20 minutes  Assessment: AXIS I:  Adjustment Disorder with Anxiety AXIS II:  Deferred AXIS III:   Past Medical History  Diagnosis Date  . Gonorrhea 08/2011    Treated in 09/2011  . Diabetes mellitus 2001    Diagnosed at age 89 ; Type I   AXIS IV:  problems with access to health care services and problems with primary support group AXIS V:  61-70 mild symptoms  Plan:  No evidence of imminent risk to self or others at present.   Patient does not meet criteria for psychiatric inpatient admission. Supportive therapy provided about ongoing stressors. Discussed crisis plan, support from social network, calling 911, coming to the Emergency Department, and calling Suicide Hotline.  Subjective:   Ashley Freeman is a 23 y.o. female patient admitted with hyperglycemia.  HPI:  Patient seen chart reviewed.  Patient was admitted on the medical floor because of noncompliance with her medication and presented in DKA.  Consult was called because patient has been refusing her medication and needed a capacity.  The patient was cooperative but appears tired.  Patient did not agree that she has not taking her medication.  Patient although she does not have insulin at home this was the reason that her sugar went up.  Patient denies any history of depression, mania, psychosis or any hallucination.  Patient denies any depressive thoughts but admitted sometime anxious because of diabetes and chronic health issues.  Patient was relevant in her conversation.  She denies any suicidal thoughts or homicidal thoughts.  She appears anxious but cooperative.  Her thought process is slow but logical and goal-directed.  She admitted there has been a time when she had missed insulin but she wants to get better and want to continue treatment to  control her hyperglycemia.  Patient is currently not on any psychotropic medication.  Patient lives with her mother.  Past Psychiatric History: Past Medical History  Diagnosis Date  . Gonorrhea 08/2011    Treated in 09/2011  . Diabetes mellitus 2001    Diagnosed at age 56 ; Type I    reports that she has never smoked. She has never used smokeless tobacco. She reports that she does not drink alcohol or use illicit drugs. Family History  Problem Relation Age of Onset  . Anesthesia problems Neg Hx   . Asthma Mother   . Gout Father   . Diabetes Paternal Grandmother            Allergies:   Allergies  Allergen Reactions  . Penicillins Hives    ACT Assessment Complete:  No:   Past Psychiatric History: Patient has no past psychiatric history.   Place of Residence:  Lives with her mother Marital Status:  Single Employed/Unemployed:  Unemployed Family Supports:  Yes Objective: Blood pressure 109/72, pulse 108, temperature 99 F (37.2 C), temperature source Oral, resp. rate 26, height _0  (1.651 m), weight 120 lb (54.432 kg), last menstrual period 07/17/2013, SpO2 100.00%.Body mass index is 19.97 kg/(m^2). Results for orders placed during the hospital encounter of 08/16/13 (from the past 72 hour(s))  CBG MONITORING, ED     Status: Abnormal   Collection Time    08/16/13 10:28 PM      Result Value Ref Range   Glucose-Capillary >600 (*) 70 - 99 mg/dL  CBC  WITH DIFFERENTIAL     Status: Abnormal   Collection Time    08/16/13 10:35 PM      Result Value Ref Range   WBC 46.3 (*) 4.0 - 10.5 K/uL   Comment: REPEATED TO VERIFY   RBC 4.73  3.87 - 5.11 MIL/uL   Hemoglobin 13.2  12.0 - 15.0 g/dL   HCT 39.1  36.0 - 46.0 %   MCV 82.7  78.0 - 100.0 fL   MCH 27.9  26.0 - 34.0 pg   MCHC 33.8  30.0 - 36.0 g/dL   Comment: CORRECTED FOR INTERFERING SUBSTANCE   RDW 13.1  11.5 - 15.5 %   Platelets 482 (*) 150 - 400 K/uL   Neutrophils Relative % 85 (*) 43 - 77 %   Lymphocytes Relative 6 (*)  12 - 46 %   Monocytes Relative 9  3 - 12 %   Eosinophils Relative 0  0 - 5 %   Basophils Relative 0  0 - 1 %   Neutro Abs 39.3 (*) 1.7 - 7.7 K/uL   Lymphs Abs 2.8  0.7 - 4.0 K/uL   Monocytes Absolute 4.2 (*) 0.1 - 1.0 K/uL   Eosinophils Absolute 0.0  0.0 - 0.7 K/uL   Basophils Absolute 0.0  0.0 - 0.1 K/uL   RBC Morphology LARGE PLATELETS PRESENT     WBC Morphology MILD LEFT SHIFT (1-5% METAS, OCC MYELO, OCC BANDS)     Comment: TOXIC GRANULATION     VACUOLATED NEUTROPHILS     DOHLE BODIES  BASIC METABOLIC PANEL     Status: Abnormal   Collection Time    08/16/13 10:35 PM      Result Value Ref Range   Sodium 121 (*) 137 - 147 mEq/L   Comment: CRITICAL RESULT CALLED TO, READ BACK BY AND VERIFIED WITH:     MAYS,L RN $RemoveB'@2326'[id number]$  ON 04.19.2015 BY MCREYNOLDS,B   Potassium 5.8 (*) 3.7 - 5.3 mEq/L   Chloride 75 (*) 96 - 112 mEq/L   CO2 <7 (*) 19 - 32 mEq/L   Comment: CRITICAL RESULT CALLED TO, READ BACK BY AND VERIFIED WITH:     MAYS,L RN $RemoveB'@2326'[id number]$  ON 04.19.2015 BY MCREYNOLDS,B   Glucose, Bld 961 (*) 70 - 99 mg/dL   Comment: REPEATED TO VERIFY     CRITICAL RESULT CALLED TO, READ BACK BY AND VERIFIED WITH:     MAYS,L RN $RemoveB'@2326'[id number]$  ON 04.19.2015 BY MCREYNOLDS,B   BUN 18  6 - 23 mg/dL   Creatinine, Ser 0.78  0.50 - 1.10 mg/dL   Calcium 11.1 (*) 8.4 - 10.5 mg/dL   GFR calc non Af Amer >90  >90 mL/min   GFR calc Af Amer >90  >90 mL/min   Comment: (NOTE)     The eGFR has been calculated using the CKD EPI equation.     This calculation has not been validated in all clinical situations.     eGFR's persistently <90 mL/min signify possible Chronic Kidney     Disease.  TROPONIN I     Status: None   Collection Time    08/16/13 10:35 PM      Result Value Ref Range   Troponin I <0.30  <0.30 ng/mL   Comment:            Due to the release kinetics of cTnI,     a negative result within the first hours     of the onset of symptoms does not rule out  myocardial infarction with certainty.     If myocardial  infarction is still suspected,     repeat the test at appropriate intervals.  BLOOD GAS, ARTERIAL     Status: Abnormal   Collection Time    08/16/13 10:45 PM      Result Value Ref Range   FIO2 0.21     Delivery systems ROOM AIR     pH, Arterial 7.162 (*) 7.350 - 7.450   Comment: CRITICAL RESULT CALLED TO, READ BACK BY AND VERIFIED WITH:     KELLY HUMES, PA AT 2252 BY ANNALISSA BAYLOR,RRT,RCP ON 4.     CRITICAL RESULT CALLED TO, READ BACK BY AND VERIFIED WITH:     KELLY HUMES, PA AT 2252 BY ANNALISSA BAYLOR,RRT,RCP ON 08/16/13   pCO2 arterial 7.3 (*) 35.0 - 45.0 mmHg   Comment: CRITICAL RESULT CALLED TO, READ BACK BY AND VERIFIED WITH:     KELLY HUMES, PA AT 2252 BY ANNALISSA BAYLOR,RRT,RCP ON 08/16/13   pO2, Arterial 131.0 (*) 80.0 - 100.0 mmHg   Bicarbonate 2.5 (*) 20.0 - 24.0 mEq/L   TCO2 2.4  0 - 100 mmol/L   Acid-base deficit 26.2 (*) 0.0 - 2.0 mmol/L   O2 Saturation 97.0     Patient temperature 98.6     Collection site RIGHT RADIAL     Drawn by [id number]     Sample type ARTERIAL     Allens test (pass/fail) PASS  PASS  I-STAT CG4 LACTIC ACID, ED     Status: Abnormal   Collection Time    08/16/13 11:10 PM      Result Value Ref Range   Lactic Acid, Venous 4.20 (*) 0.5 - 2.2 mmol/L  URINALYSIS, ROUTINE W REFLEX MICROSCOPIC     Status: Abnormal   Collection Time    08/16/13 11:39 PM      Result Value Ref Range   Color, Urine YELLOW  YELLOW   APPearance CLEAR  CLEAR   Specific Gravity, Urine 1.029  1.005 - 1.030   pH 5.0  5.0 - 8.0   Glucose, UA >1000 (*) NEGATIVE mg/dL   Hgb urine dipstick NEGATIVE  NEGATIVE   Bilirubin Urine NEGATIVE  NEGATIVE   Ketones, ur >80 (*) NEGATIVE mg/dL   Protein, ur 30 (*) NEGATIVE mg/dL   Urobilinogen, UA 0.2  0.0 - 1.0 mg/dL   Nitrite NEGATIVE  NEGATIVE   Leukocytes, UA NEGATIVE  NEGATIVE  PREGNANCY, URINE     Status: None   Collection Time    08/16/13 11:39 PM      Result Value Ref Range   Preg Test, Ur NEGATIVE  NEGATIVE   Comment:             THE SENSITIVITY OF THIS     METHODOLOGY IS >20 mIU/mL.  URINE CULTURE     Status: None   Collection Time    08/16/13 11:39 PM      Result Value Ref Range   Specimen Description URINE, CATHETERIZED     Special Requests NONE     Culture  Setup Time       Value: 08/17/2013 10:14     Performed at [facility]       Value: NO GROWTH     Performed at [facility]   Culture       Value: NO GROWTH     Performed at [facility]   Report Status 08/18/2013 FINAL    URINE  MICROSCOPIC-ADD ON     Status: None   Collection Time    08/16/13 11:39 PM      Result Value Ref Range   Urine-Other AMORPHOUS URATES/PHOSPHATES     Comment: MUCOUS PRESENT  CBG MONITORING, ED     Status: Abnormal   Collection Time    08/17/13 12:17 AM      Result Value Ref Range   Glucose-Capillary >600 (*) 70 - 99 mg/dL  CULTURE, BLOOD (ROUTINE X 2)     Status: None   Collection Time    08/17/13  1:00 AM      Result Value Ref Range   Specimen Description BLOOD LEFT FEMORAL VEIN     Special Requests BOTTLES DRAWN AEROBIC ONLY 6CC     Culture  Setup Time       Value: 08/17/2013 10:09     Performed at [facility]   Culture       Value:        BLOOD CULTURE RECEIVED NO GROWTH TO DATE CULTURE WILL BE HELD FOR 5 DAYS BEFORE ISSUING A FINAL NEGATIVE REPORT     Performed at [facility]   Report Status PENDING    CULTURE, BLOOD (ROUTINE X 2)     Status: None   Collection Time    08/17/13  1:00 AM      Result Value Ref Range   Specimen Description BLOOD LEFT FEMORAL ARTERY     Special Requests BOTTLES DRAWN AEROBIC ONLY 5CC     Culture  Setup Time       Value: 08/17/2013 10:09     Performed at [facility]   Culture       Value:        BLOOD CULTURE RECEIVED NO GROWTH TO DATE CULTURE WILL BE HELD FOR 5 DAYS BEFORE ISSUING A FINAL NEGATIVE REPORT     Performed at [facility]   Report Status PENDING    CBG MONITORING, ED     Status:  Abnormal   Collection Time    08/17/13  1:21 AM      Result Value Ref Range   Glucose-Capillary 568 (*) 70 - 99 mg/dL  I-STAT CHEM 8, ED     Status: Abnormal   Collection Time    08/17/13  1:41 AM      Result Value Ref Range   Sodium 131 (*) 137 - 147 mEq/L   Potassium 5.1  3.7 - 5.3 mEq/L   Chloride 104  96 - 112 mEq/L   BUN 16  6 - 23 mg/dL   Creatinine, Ser 0.80  0.50 - 1.10 mg/dL   Glucose, Bld >700 (*) 70 - 99 mg/dL   Calcium, Ion 1.25 (*) 1.12 - 1.23 mmol/L   TCO2 6  0 - 100 mmol/L   Hemoglobin 12.6  12.0 - 15.0 g/dL   HCT 37.0  36.0 - 46.0 %   Comment NOTIFIED PHYSICIAN    CBG MONITORING, ED     Status: Abnormal   Collection Time    08/17/13  2:43 AM      Result Value Ref Range   Glucose-Capillary 477 (*) 70 - 99 mg/dL  GLUCOSE, CAPILLARY     Status: Abnormal   Collection Time    08/17/13  3:20 AM      Result Value Ref Range   Glucose-Capillary 421 (*) 70 - 99 mg/dL  BASIC METABOLIC PANEL     Status: Abnormal   Collection Time  08/17/13  4:12 AM      Result Value Ref Range   Sodium 134 (*) 137 - 147 mEq/L   Potassium 4.3  3.7 - 5.3 mEq/L   Chloride 102  96 - 112 mEq/L   CO2 <7 (*) 19 - 32 mEq/L   Comment: CRITICAL RESULT CALLED TO, READ BACK BY AND VERIFIED WITH:     HAYES C ,RN 08/17/13 0446 WAYK   Glucose, Bld 364 (*) 70 - 99 mg/dL   BUN 14  6 - 23 mg/dL   Creatinine, Ser 0.59  0.50 - 1.10 mg/dL   Calcium 8.8  8.4 - 10.5 mg/dL   GFR calc non Af Amer >90  >90 mL/min   GFR calc Af Amer >90  >90 mL/min   Comment: (NOTE)     The eGFR has been calculated using the CKD EPI equation.     This calculation has not been validated in all clinical situations.     eGFR's persistently <90 mL/min signify possible Chronic Kidney     Disease.  GLUCOSE, CAPILLARY     Status: Abnormal   Collection Time    08/17/13  4:35 AM      Result Value Ref Range   Glucose-Capillary 286 (*) 70 - 99 mg/dL  CBC     Status: Abnormal   Collection Time    08/17/13  4:55 AM       Result Value Ref Range   WBC 45.0 (*) 4.0 - 10.5 K/uL   RBC 3.65 (*) 3.87 - 5.11 MIL/uL   Hemoglobin 10.3 (*) 12.0 - 15.0 g/dL   HCT 30.5 (*) 36.0 - 46.0 %   MCV 83.6  78.0 - 100.0 fL   MCH 28.2  26.0 - 34.0 pg   MCHC 33.8  30.0 - 36.0 g/dL   RDW 13.2  11.5 - 15.5 %   Platelets 391  150 - 400 K/uL  BASIC METABOLIC PANEL     Status: Abnormal   Collection Time    08/17/13  5:30 AM      Result Value Ref Range   Sodium 138  137 - 147 mEq/L   Potassium 3.8  3.7 - 5.3 mEq/L   Chloride 105  96 - 112 mEq/L   CO2 <7 (*) 19 - 32 mEq/L   Comment: CRITICAL RESULT CALLED TO, READ BACK BY AND VERIFIED WITH:     VARNER L,RN 08/17/13 0707 WAYK   Glucose, Bld 246 (*) 70 - 99 mg/dL   BUN 11  6 - 23 mg/dL   Creatinine, Ser 0.49 (*) 0.50 - 1.10 mg/dL   Calcium 8.4  8.4 - 10.5 mg/dL   GFR calc non Af Amer >90  >90 mL/min   GFR calc Af Amer >90  >90 mL/min   Comment: (NOTE)     The eGFR has been calculated using the CKD EPI equation.     This calculation has not been validated in all clinical situations.     eGFR's persistently <90 mL/min signify possible Chronic Kidney     Disease.  GLUCOSE, CAPILLARY     Status: Abnormal   Collection Time    08/17/13  5:33 AM      Result Value Ref Range   Glucose-Capillary 223 (*) 70 - 99 mg/dL  GLUCOSE, CAPILLARY     Status: Abnormal   Collection Time    08/17/13  6:36 AM      Result Value Ref Range   Glucose-Capillary 215 (*) 70 - 99 mg/dL  CBC WITH DIFFERENTIAL     Status: Abnormal   Collection Time    08/17/13  7:48 AM      Result Value Ref Range   WBC 32.1 (*) 4.0 - 10.5 K/uL   RBC 3.38 (*) 3.87 - 5.11 MIL/uL   Hemoglobin 9.3 (*) 12.0 - 15.0 g/dL   HCT 27.6 (*) 36.0 - 46.0 %   MCV 81.7  78.0 - 100.0 fL   MCH 27.5  26.0 - 34.0 pg   MCHC 33.7  30.0 - 36.0 g/dL   RDW 13.2  11.5 - 15.5 %   Platelets 331  150 - 400 K/uL   Neutrophils Relative % 80 (*) 43 - 77 %   Lymphocytes Relative 11 (*) 12 - 46 %   Monocytes Relative 9  3 - 12 %   Eosinophils  Relative 0  0 - 5 %   Basophils Relative 0  0 - 1 %   Neutro Abs 25.7 (*) 1.7 - 7.7 K/uL   Lymphs Abs 3.5  0.7 - 4.0 K/uL   Monocytes Absolute 2.9 (*) 0.1 - 1.0 K/uL   Eosinophils Absolute 0.0  0.0 - 0.7 K/uL   Basophils Absolute 0.0  0.0 - 0.1 K/uL   WBC Morphology TOXIC GRANULATION     Comment: INCREASED BANDS (>20% BANDS)     MILD LEFT SHIFT (1-5% METAS, OCC MYELO, OCC BANDS)  GLUCOSE, CAPILLARY     Status: Abnormal   Collection Time    08/17/13  7:48 AM      Result Value Ref Range   Glucose-Capillary 198 (*) 70 - 99 mg/dL  BASIC METABOLIC PANEL     Status: Abnormal   Collection Time    08/17/13  8:00 AM      Result Value Ref Range   Sodium 140  137 - 147 mEq/L   Potassium 3.7  3.7 - 5.3 mEq/L   Chloride 107  96 - 112 mEq/L   CO2 11 (*) 19 - 32 mEq/L   Glucose, Bld 211 (*) 70 - 99 mg/dL   BUN 9  6 - 23 mg/dL   Creatinine, Ser 0.48 (*) 0.50 - 1.10 mg/dL   Calcium 8.2 (*) 8.4 - 10.5 mg/dL   GFR calc non Af Amer >90  >90 mL/min   GFR calc Af Amer >90  >90 mL/min   Comment: (NOTE)     The eGFR has been calculated using the CKD EPI equation.     This calculation has not been validated in all clinical situations.     eGFR's persistently <90 mL/min signify possible Chronic Kidney     Disease.  LACTIC ACID, PLASMA     Status: None   Collection Time    08/17/13  8:00 AM      Result Value Ref Range   Lactic Acid, Venous 1.3  0.5 - 2.2 mmol/L  CULTURE, BLOOD (ROUTINE X 2)     Status: None   Collection Time    08/17/13  8:52 AM      Result Value Ref Range   Specimen Description BLOOD LEFT ANTECUBITAL     Special Requests BOTTLES DRAWN AEROBIC ONLY [place]     Culture  Setup Time       Value: 08/17/2013 13:30     Performed at [facility]   Culture       Value:        BLOOD CULTURE RECEIVED NO GROWTH TO DATE CULTURE WILL BE HELD FOR 5 DAYS  BEFORE ISSUING A FINAL NEGATIVE REPORT     Performed at [facility]   Report Status PENDING    GLUCOSE, CAPILLARY      Status: Abnormal   Collection Time    08/17/13  8:52 AM      Result Value Ref Range   Glucose-Capillary 183 (*) 70 - 99 mg/dL  CULTURE, BLOOD (ROUTINE X 2)     Status: None   Collection Time    08/17/13  9:00 AM      Result Value Ref Range   Specimen Description BLOOD LEFT HAND     Special Requests BOTTLES DRAWN AEROBIC ONLY [place]     Culture  Setup Time       Value: 08/17/2013 13:31     Performed at [facility]   Culture       Value:        BLOOD CULTURE RECEIVED NO GROWTH TO DATE CULTURE WILL BE HELD FOR 5 DAYS BEFORE ISSUING A FINAL NEGATIVE REPORT     Performed at [facility]   Report Status PENDING    GLUCOSE, CAPILLARY     Status: Abnormal   Collection Time    08/17/13 10:01 AM      Result Value Ref Range   Glucose-Capillary 168 (*) 70 - 99 mg/dL  BASIC METABOLIC PANEL     Status: Abnormal   Collection Time    08/17/13 10:20 AM      Result Value Ref Range   Sodium 139  137 - 147 mEq/L   Potassium 3.5 (*) 3.7 - 5.3 mEq/L   Chloride 106  96 - 112 mEq/L   CO2 15 (*) 19 - 32 mEq/L   Glucose, Bld 181 (*) 70 - 99 mg/dL   BUN 7  6 - 23 mg/dL   Creatinine, Ser 0.44 (*) 0.50 - 1.10 mg/dL   Calcium 8.3 (*) 8.4 - 10.5 mg/dL   GFR calc non Af Amer >90  >90 mL/min   GFR calc Af Amer >90  >90 mL/min   Comment: (NOTE)     The eGFR has been calculated using the CKD EPI equation.     This calculation has not been validated in all clinical situations.     eGFR's persistently <90 mL/min signify possible Chronic Kidney     Disease.  GLUCOSE, CAPILLARY     Status: Abnormal   Collection Time    08/17/13 10:54 AM      Result Value Ref Range   Glucose-Capillary 161 (*) 70 - 99 mg/dL  GLUCOSE, CAPILLARY     Status: Abnormal   Collection Time    08/17/13 12:01 PM      Result Value Ref Range   Glucose-Capillary 167 (*) 70 - 99 mg/dL  GLUCOSE, CAPILLARY     Status: Abnormal   Collection Time    08/17/13 12:57 PM      Result Value Ref Range   Glucose-Capillary 144 (*)  70 - 99 mg/dL  BASIC METABOLIC PANEL     Status: Abnormal   Collection Time    08/17/13  1:55 PM      Result Value Ref Range   Sodium 138  137 - 147 mEq/L   Potassium 3.0 (*) 3.7 - 5.3 mEq/L   Chloride 103  96 - 112 mEq/L   CO2 19  19 - 32 mEq/L   Glucose, Bld 126 (*) 70 - 99 mg/dL   BUN 7  6 - 23 mg/dL   Creatinine,  Ser 0.42 (*) 0.50 - 1.10 mg/dL   Calcium 8.1 (*) 8.4 - 10.5 mg/dL   GFR calc non Af Amer >90  >90 mL/min   GFR calc Af Amer >90  >90 mL/min   Comment: (NOTE)     The eGFR has been calculated using the CKD EPI equation.     This calculation has not been validated in all clinical situations.     eGFR's persistently <90 mL/min signify possible Chronic Kidney     Disease.  MAGNESIUM     Status: None   Collection Time    08/17/13  1:55 PM      Result Value Ref Range   Magnesium 1.5  1.5 - 2.5 mg/dL  PHOSPHORUS     Status: Abnormal   Collection Time    08/17/13  1:55 PM      Result Value Ref Range   Phosphorus <0.5 (*) 2.3 - 4.6 mg/dL   Comment: RESULT REPEATED AND VERIFIED     CRITICAL RESULT CALLED TO, READ BACK BY AND VERIFIED WITH:     T.HARVEY,RN 1457 08/17/13 CLARK,S  GLUCOSE, CAPILLARY     Status: Abnormal   Collection Time    08/17/13  2:05 PM      Result Value Ref Range   Glucose-Capillary 116 (*) 70 - 99 mg/dL  BASIC METABOLIC PANEL     Status: Abnormal   Collection Time    08/17/13  3:00 PM      Result Value Ref Range   Sodium 137  137 - 147 mEq/L   Potassium 3.1 (*) 3.7 - 5.3 mEq/L   Chloride 103  96 - 112 mEq/L   CO2 18 (*) 19 - 32 mEq/L   Glucose, Bld 133 (*) 70 - 99 mg/dL   BUN 6  6 - 23 mg/dL   Creatinine, Ser 0.45 (*) 0.50 - 1.10 mg/dL   Calcium 8.3 (*) 8.4 - 10.5 mg/dL   GFR calc non Af Amer >90  >90 mL/min   GFR calc Af Amer >90  >90 mL/min   Comment: (NOTE)     The eGFR has been calculated using the CKD EPI equation.     This calculation has not been validated in all clinical situations.     eGFR's persistently <90 mL/min signify  possible Chronic Kidney     Disease.  GLUCOSE, CAPILLARY     Status: Abnormal   Collection Time    08/17/13  3:10 PM      Result Value Ref Range   Glucose-Capillary 206 (*) 70 - 99 mg/dL  GLUCOSE, CAPILLARY     Status: Abnormal   Collection Time    08/17/13  3:55 PM      Result Value Ref Range   Glucose-Capillary 114 (*) 70 - 99 mg/dL  URINALYSIS, ROUTINE W REFLEX MICROSCOPIC     Status: Abnormal   Collection Time    08/17/13  4:05 PM      Result Value Ref Range   Color, Urine YELLOW  YELLOW   APPearance CLOUDY (*) CLEAR   Specific Gravity, Urine 1.025  1.005 - 1.030   pH 6.0  5.0 - 8.0   Glucose, UA >1000 (*) NEGATIVE mg/dL   Hgb urine dipstick TRACE (*) NEGATIVE   Bilirubin Urine NEGATIVE  NEGATIVE   Ketones, ur >80 (*) NEGATIVE mg/dL   Protein, ur 100 (*) NEGATIVE mg/dL   Urobilinogen, UA 0.2  0.0 - 1.0 mg/dL   Nitrite NEGATIVE  NEGATIVE   Leukocytes, UA  SMALL (*) NEGATIVE  GC/CHLAMYDIA PROBE AMP     Status: None   Collection Time    08/17/13  4:05 PM      Result Value Ref Range   CT Probe RNA NEGATIVE  NEGATIVE   GC Probe RNA NEGATIVE  NEGATIVE   Comment: (NOTE)                                                                                               **Normal Reference Range: Negative**          Assay performed using the Gen-Probe APTIMA COMBO2 (R) Assay.     Acceptable specimen types for this assay include APTIMA Swabs (Unisex,     endocervical, urethral, or vaginal), first void urine, and ThinPrep     liquid based cytology samples.     Performed at [facility]     Status: Abnormal   Collection Time    08/17/13  4:05 PM      Result Value Ref Range   Squamous Epithelial / LPF FEW (*) RARE   WBC, UA 7-10  <3 WBC/hpf   RBC / HPF 0-2  <3 RBC/hpf   Bacteria, UA FEW (*) RARE   Casts HYALINE CASTS (*) NEGATIVE   Comment: GRANULAR CAST   Urine-Other AMORPHOUS URATES/PHOSPHATES     Comment: TRICHOMONAS PRESENT  LACTIC ACID,  PLASMA     Status: None   Collection Time    08/17/13  5:00 PM      Result Value Ref Range   Lactic Acid, Venous 1.1  0.5 - 2.2 mmol/L  GLUCOSE, CAPILLARY     Status: Abnormal   Collection Time    08/17/13  5:15 PM      Result Value Ref Range   Glucose-Capillary 204 (*) 70 - 99 mg/dL  CBC WITH DIFFERENTIAL     Status: Abnormal   Collection Time    08/17/13  5:52 PM      Result Value Ref Range   WBC 26.3 (*) 4.0 - 10.5 K/uL   RBC 3.25 (*) 3.87 - 5.11 MIL/uL   Hemoglobin 9.0 (*) 12.0 - 15.0 g/dL   HCT 26.1 (*) 36.0 - 46.0 %   MCV 80.3  78.0 - 100.0 fL   MCH 27.7  26.0 - 34.0 pg   MCHC 34.5  30.0 - 36.0 g/dL   RDW 12.7  11.5 - 15.5 %   Platelets 327  150 - 400 K/uL   Neutrophils Relative % 78 (*) 43 - 77 %   Lymphocytes Relative 14  12 - 46 %   Monocytes Relative 8  3 - 12 %   Eosinophils Relative 0  0 - 5 %   Basophils Relative 0  0 - 1 %   Neutro Abs 20.5 (*) 1.7 - 7.7 K/uL   Lymphs Abs 3.7  0.7 - 4.0 K/uL   Monocytes Absolute 2.1 (*) 0.1 - 1.0 K/uL   Eosinophils Absolute 0.0  0.0 - 0.7 K/uL   Basophils Absolute 0.0  0.0 - 0.1 K/uL   RBC Morphology BURR CELLS  WBC Morphology TOXIC GRANULATION     Comment: MILD LEFT SHIFT (1-5% METAS, OCC MYELO, OCC BANDS)  BASIC METABOLIC PANEL     Status: Abnormal   Collection Time    08/17/13  5:52 PM      Result Value Ref Range   Sodium 134 (*) 137 - 147 mEq/L   Potassium 3.6 (*) 3.7 - 5.3 mEq/L   Chloride 100  96 - 112 mEq/L   CO2 15 (*) 19 - 32 mEq/L   Glucose, Bld 247 (*) 70 - 99 mg/dL   BUN 6  6 - 23 mg/dL   Creatinine, Ser 0.46 (*) 0.50 - 1.10 mg/dL   Calcium 8.1 (*) 8.4 - 10.5 mg/dL   GFR calc non Af Amer >90  >90 mL/min   GFR calc Af Amer >90  >90 mL/min   Comment: (NOTE)     The eGFR has been calculated using the CKD EPI equation.     This calculation has not been validated in all clinical situations.     eGFR's persistently <90 mL/min signify possible Chronic Kidney     Disease.  GLUCOSE, CAPILLARY     Status:  Abnormal   Collection Time    08/17/13  6:54 PM      Result Value Ref Range   Glucose-Capillary 334 (*) 70 - 99 mg/dL  COMPREHENSIVE METABOLIC PANEL     Status: Abnormal   Collection Time    08/17/13  7:55 PM      Result Value Ref Range   Sodium 131 (*) 137 - 147 mEq/L   Potassium 3.2 (*) 3.7 - 5.3 mEq/L   Chloride 96  96 - 112 mEq/L   CO2 9 (*) 19 - 32 mEq/L   Comment: CRITICAL RESULT CALLED TO, READ BACK BY AND VERIFIED WITH:     L VARNER,RN 2030 08/17/13 D BRADLEY   Glucose, Bld 353 (*) 70 - 99 mg/dL   BUN 7  6 - 23 mg/dL   Creatinine, Ser 0.48 (*) 0.50 - 1.10 mg/dL   Calcium 7.9 (*) 8.4 - 10.5 mg/dL   Total Protein 6.1  6.0 - 8.3 g/dL   Albumin 1.9 (*) 3.5 - 5.2 g/dL   AST 13  0 - 37 U/L   ALT 6  0 - 35 U/L   Alkaline Phosphatase 142 (*) 39 - 117 U/L   Total Bilirubin <0.2 (*) 0.3 - 1.2 mg/dL   GFR calc non Af Amer >90  >90 mL/min   GFR calc Af Amer >90  >90 mL/min   Comment: (NOTE)     The eGFR has been calculated using the CKD EPI equation.     This calculation has not been validated in all clinical situations.     eGFR's persistently <90 mL/min signify possible Chronic Kidney     Disease.  GLUCOSE, CAPILLARY     Status: Abnormal   Collection Time    08/17/13  7:55 PM      Result Value Ref Range   Glucose-Capillary 323 (*) 70 - 99 mg/dL  GLUCOSE, CAPILLARY     Status: Abnormal   Collection Time    08/17/13  8:58 PM      Result Value Ref Range   Glucose-Capillary 281 (*) 70 - 99 mg/dL  BASIC METABOLIC PANEL     Status: Abnormal   Collection Time    08/17/13  9:31 PM      Result Value Ref Range   Sodium 135 (*) 137 - 147 mEq/L  Potassium 3.3 (*) 3.7 - 5.3 mEq/L   Chloride 100  96 - 112 mEq/L   CO2 13 (*) 19 - 32 mEq/L   Glucose, Bld 240 (*) 70 - 99 mg/dL   BUN 6  6 - 23 mg/dL   Creatinine, Ser 0.47 (*) 0.50 - 1.10 mg/dL   Calcium 8.4  8.4 - 10.5 mg/dL   GFR calc non Af Amer >90  >90 mL/min   GFR calc Af Amer >90  >90 mL/min   Comment: (NOTE)     The eGFR  has been calculated using the CKD EPI equation.     This calculation has not been validated in all clinical situations.     eGFR's persistently <90 mL/min signify possible Chronic Kidney     Disease.  GLUCOSE, CAPILLARY     Status: Abnormal   Collection Time    08/17/13  9:47 PM      Result Value Ref Range   Glucose-Capillary 208 (*) 70 - 99 mg/dL  BASIC METABOLIC PANEL     Status: Abnormal   Collection Time    08/17/13  9:51 PM      Result Value Ref Range   Sodium 133 (*) 137 - 147 mEq/L   Potassium 3.6 (*) 3.7 - 5.3 mEq/L   Chloride 99  96 - 112 mEq/L   CO2 14 (*) 19 - 32 mEq/L   Glucose, Bld 222 (*) 70 - 99 mg/dL   BUN 5 (*) 6 - 23 mg/dL   Creatinine, Ser 0.50  0.50 - 1.10 mg/dL   Calcium 8.2 (*) 8.4 - 10.5 mg/dL   GFR calc non Af Amer >90  >90 mL/min   GFR calc Af Amer >90  >90 mL/min   Comment: (NOTE)     The eGFR has been calculated using the CKD EPI equation.     This calculation has not been validated in all clinical situations.     eGFR's persistently <90 mL/min signify possible Chronic Kidney     Disease.  MAGNESIUM     Status: Abnormal   Collection Time    08/17/13  9:51 PM      Result Value Ref Range   Magnesium 2.8 (*) 1.5 - 2.5 mg/dL  PHOSPHORUS     Status: Abnormal   Collection Time    08/17/13  9:51 PM      Result Value Ref Range   Phosphorus 1.2 (*) 2.3 - 4.6 mg/dL  GLUCOSE, CAPILLARY     Status: Abnormal   Collection Time    08/17/13 10:53 PM      Result Value Ref Range   Glucose-Capillary 160 (*) 70 - 99 mg/dL  BASIC METABOLIC PANEL     Status: Abnormal   Collection Time    08/18/13 12:00 AM      Result Value Ref Range   Sodium 134 (*) 137 - 147 mEq/L   Potassium 3.7  3.7 - 5.3 mEq/L   Chloride 103  96 - 112 mEq/L   CO2 18 (*) 19 - 32 mEq/L   Glucose, Bld 104 (*) 70 - 99 mg/dL   BUN 4 (*) 6 - 23 mg/dL   Creatinine, Ser 0.44 (*) 0.50 - 1.10 mg/dL   Calcium 8.3 (*) 8.4 - 10.5 mg/dL   GFR calc non Af Amer >90  >90 mL/min   GFR calc Af Amer >90   >90 mL/min   Comment: (NOTE)     The eGFR has been calculated using the CKD EPI equation.  This calculation has not been validated in all clinical situations.     eGFR's persistently <90 mL/min signify possible Chronic Kidney     Disease.  GLUCOSE, CAPILLARY     Status: Abnormal   Collection Time    08/18/13 12:01 AM      Result Value Ref Range   Glucose-Capillary 111 (*) 70 - 99 mg/dL  GLUCOSE, CAPILLARY     Status: Abnormal   Collection Time    08/18/13  1:03 AM      Result Value Ref Range   Glucose-Capillary 113 (*) 70 - 99 mg/dL  GLUCOSE, CAPILLARY     Status: None   Collection Time    08/18/13  1:59 AM      Result Value Ref Range   Glucose-Capillary 72  70 - 99 mg/dL  BASIC METABOLIC PANEL     Status: Abnormal   Collection Time    08/18/13  2:09 AM      Result Value Ref Range   Sodium 135 (*) 137 - 147 mEq/L   Potassium 3.7  3.7 - 5.3 mEq/L   Chloride 103  96 - 112 mEq/L   CO2 18 (*) 19 - 32 mEq/L   Glucose, Bld 71  70 - 99 mg/dL   BUN 5 (*) 6 - 23 mg/dL   Creatinine, Ser 0.45 (*) 0.50 - 1.10 mg/dL   Calcium 8.2 (*) 8.4 - 10.5 mg/dL   GFR calc non Af Amer >90  >90 mL/min   GFR calc Af Amer >90  >90 mL/min   Comment: (NOTE)     The eGFR has been calculated using the CKD EPI equation.     This calculation has not been validated in all clinical situations.     eGFR's persistently <90 mL/min signify possible Chronic Kidney     Disease.  GLUCOSE, CAPILLARY     Status: Abnormal   Collection Time    08/18/13  2:55 AM      Result Value Ref Range   Glucose-Capillary 128 (*) 70 - 99 mg/dL  BASIC METABOLIC PANEL     Status: Abnormal   Collection Time    08/18/13  3:21 AM      Result Value Ref Range   Sodium 135 (*) 137 - 147 mEq/L   Potassium 3.4 (*) 3.7 - 5.3 mEq/L   Chloride 103  96 - 112 mEq/L   CO2 15 (*) 19 - 32 mEq/L   Glucose, Bld 160 (*) 70 - 99 mg/dL   BUN 5 (*) 6 - 23 mg/dL   Creatinine, Ser 0.37 (*) 0.50 - 1.10 mg/dL   Calcium 8.5  8.4 - 10.5 mg/dL    GFR calc non Af Amer >90  >90 mL/min   GFR calc Af Amer >90  >90 mL/min   Comment: (NOTE)     The eGFR has been calculated using the CKD EPI equation.     This calculation has not been validated in all clinical situations.     eGFR's persistently <90 mL/min signify possible Chronic Kidney     Disease.  CBC     Status: Abnormal   Collection Time    08/18/13  3:21 AM      Result Value Ref Range   WBC 25.3 (*) 4.0 - 10.5 K/uL   RBC 3.27 (*) 3.87 - 5.11 MIL/uL   Hemoglobin 9.1 (*) 12.0 - 15.0 g/dL   HCT 26.0 (*) 36.0 - 46.0 %   MCV 79.5  78.0 - 100.0 fL  MCH 27.8  26.0 - 34.0 pg   MCHC 35.0  30.0 - 36.0 g/dL   RDW 12.8  11.5 - 15.5 %   Platelets 326  150 - 400 K/uL  MAGNESIUM     Status: None   Collection Time    08/18/13  3:21 AM      Result Value Ref Range   Magnesium 2.5  1.5 - 2.5 mg/dL  GLUCOSE, CAPILLARY     Status: Abnormal   Collection Time    08/18/13  4:01 AM      Result Value Ref Range   Glucose-Capillary 162 (*) 70 - 99 mg/dL  GLUCOSE, CAPILLARY     Status: Abnormal   Collection Time    08/18/13  5:04 AM      Result Value Ref Range   Glucose-Capillary 164 (*) 70 - 99 mg/dL  BASIC METABOLIC PANEL     Status: Abnormal   Collection Time    08/18/13  5:55 AM      Result Value Ref Range   Sodium 136 (*) 137 - 147 mEq/L   Potassium 3.3 (*) 3.7 - 5.3 mEq/L   Chloride 102  96 - 112 mEq/L   CO2 18 (*) 19 - 32 mEq/L   Glucose, Bld 160 (*) 70 - 99 mg/dL   BUN 4 (*) 6 - 23 mg/dL   Creatinine, Ser 0.48 (*) 0.50 - 1.10 mg/dL   Calcium 8.1 (*) 8.4 - 10.5 mg/dL   GFR calc non Af Amer >90  >90 mL/min   GFR calc Af Amer >90  >90 mL/min   Comment: (NOTE)     The eGFR has been calculated using the CKD EPI equation.     This calculation has not been validated in all clinical situations.     eGFR's persistently <90 mL/min signify possible Chronic Kidney     Disease.  GLUCOSE, CAPILLARY     Status: Abnormal   Collection Time    08/18/13  6:04 AM      Result Value Ref Range    Glucose-Capillary 148 (*) 70 - 99 mg/dL  GLUCOSE, CAPILLARY     Status: Abnormal   Collection Time    08/18/13  7:02 AM      Result Value Ref Range   Glucose-Capillary 169 (*) 70 - 99 mg/dL  GLUCOSE, CAPILLARY     Status: Abnormal   Collection Time    08/18/13  7:37 AM      Result Value Ref Range   Glucose-Capillary 173 (*) 70 - 99 mg/dL  BASIC METABOLIC PANEL     Status: Abnormal   Collection Time    08/18/13  7:55 AM      Result Value Ref Range   Sodium 134 (*) 137 - 147 mEq/L   Potassium 3.8  3.7 - 5.3 mEq/L   Chloride 102  96 - 112 mEq/L   CO2 19  19 - 32 mEq/L   Glucose, Bld 170 (*) 70 - 99 mg/dL   BUN 4 (*) 6 - 23 mg/dL   Creatinine, Ser 0.44 (*) 0.50 - 1.10 mg/dL   Calcium 8.2 (*) 8.4 - 10.5 mg/dL   GFR calc non Af Amer >90  >90 mL/min   GFR calc Af Amer >90  >90 mL/min   Comment: (NOTE)     The eGFR has been calculated using the CKD EPI equation.     This calculation has not been validated in all clinical situations.     eGFR's persistently <90  mL/min signify possible Chronic Kidney     Disease.  GLUCOSE, CAPILLARY     Status: Abnormal   Collection Time    08/18/13  8:55 AM      Result Value Ref Range   Glucose-Capillary 159 (*) 70 - 99 mg/dL  BASIC METABOLIC PANEL     Status: Abnormal   Collection Time    08/18/13  9:55 AM      Result Value Ref Range   Sodium 134 (*) 137 - 147 mEq/L   Potassium 3.6 (*) 3.7 - 5.3 mEq/L   Chloride 101  96 - 112 mEq/L   CO2 18 (*) 19 - 32 mEq/L   Glucose, Bld 174 (*) 70 - 99 mg/dL   BUN 4 (*) 6 - 23 mg/dL   Creatinine, Ser 0.46 (*) 0.50 - 1.10 mg/dL   Calcium 8.3 (*) 8.4 - 10.5 mg/dL   GFR calc non Af Amer >90  >90 mL/min   GFR calc Af Amer >90  >90 mL/min   Comment: (NOTE)     The eGFR has been calculated using the CKD EPI equation.     This calculation has not been validated in all clinical situations.     eGFR's persistently <90 mL/min signify possible Chronic Kidney     Disease.  PHOSPHORUS     Status: Abnormal    Collection Time    08/18/13  9:55 AM      Result Value Ref Range   Phosphorus <0.5 (*) 2.3 - 4.6 mg/dL   Comment: CRITICAL RESULT CALLED TO, READ BACK BY AND VERIFIED WITH:     MOYER,P RN @ 1200 08/18/13 LEONARD,A  GLUCOSE, CAPILLARY     Status: Abnormal   Collection Time    08/18/13 10:05 AM      Result Value Ref Range   Glucose-Capillary 175 (*) 70 - 99 mg/dL  GLUCOSE, CAPILLARY     Status: Abnormal   Collection Time    08/18/13 11:09 AM      Result Value Ref Range   Glucose-Capillary 176 (*) 70 - 99 mg/dL  BASIC METABOLIC PANEL     Status: Abnormal   Collection Time    08/18/13 11:55 AM      Result Value Ref Range   Sodium 134 (*) 137 - 147 mEq/L   Potassium 3.3 (*) 3.7 - 5.3 mEq/L   Chloride 101  96 - 112 mEq/L   CO2 19  19 - 32 mEq/L   Glucose, Bld 152 (*) 70 - 99 mg/dL   BUN 3 (*) 6 - 23 mg/dL   Creatinine, Ser 0.43 (*) 0.50 - 1.10 mg/dL   Calcium 8.2 (*) 8.4 - 10.5 mg/dL   GFR calc non Af Amer >90  >90 mL/min   GFR calc Af Amer >90  >90 mL/min   Comment: (NOTE)     The eGFR has been calculated using the CKD EPI equation.     This calculation has not been validated in all clinical situations.     eGFR's persistently <90 mL/min signify possible Chronic Kidney     Disease.  GLUCOSE, CAPILLARY     Status: Abnormal   Collection Time    08/18/13 12:21 PM      Result Value Ref Range   Glucose-Capillary 156 (*) 70 - 99 mg/dL  BASIC METABOLIC PANEL     Status: Abnormal   Collection Time    08/18/13  1:20 PM      Result Value Ref Range   Sodium  133 (*) 137 - 147 mEq/L   Potassium 3.3 (*) 3.7 - 5.3 mEq/L   Chloride 102  96 - 112 mEq/L   CO2 19  19 - 32 mEq/L   Glucose, Bld 151 (*) 70 - 99 mg/dL   BUN 3 (*) 6 - 23 mg/dL   Creatinine, Ser 0.37 (*) 0.50 - 1.10 mg/dL   Calcium 8.6  8.4 - 10.5 mg/dL   GFR calc non Af Amer >90  >90 mL/min   GFR calc Af Amer >90  >90 mL/min   Comment: (NOTE)     The eGFR has been calculated using the CKD EPI equation.     This calculation  has not been validated in all clinical situations.     eGFR's persistently <90 mL/min signify possible Chronic Kidney     Disease.  MAGNESIUM     Status: None   Collection Time    08/18/13  1:20 PM      Result Value Ref Range   Magnesium 2.1  1.5 - 2.5 mg/dL  PHOSPHORUS     Status: Abnormal   Collection Time    08/18/13  1:20 PM      Result Value Ref Range   Phosphorus 0.6 (*) 2.3 - 4.6 mg/dL   Comment: CRITICAL RESULT CALLED TO, READ BACK BY AND VERIFIED WITH:     Priscella Mann RN AT [phone] 08/18/13 BY ZBEECH.  GLUCOSE, CAPILLARY     Status: Abnormal   Collection Time    08/18/13  1:24 PM      Result Value Ref Range   Glucose-Capillary 129 (*) 70 - 99 mg/dL  GLUCOSE, CAPILLARY     Status: Abnormal   Collection Time    08/18/13  2:29 PM      Result Value Ref Range   Glucose-Capillary 255 (*) 70 - 99 mg/dL  GLUCOSE, CAPILLARY     Status: Abnormal   Collection Time    08/18/13  3:58 PM      Result Value Ref Range   Glucose-Capillary 400 (*) 70 - 99 mg/dL   Labs are reviewed.  Current Facility-Administered Medications  Medication Dose Route Frequency Provider Last Rate Last Dose  . acetaminophen (TYLENOL) suppository 650 mg  650 mg Rectal Q6H PRN Jerene Pitch, MD      . ceFEPIme (MAXIPIME) 1 g in dextrose 5 % 50 mL IVPB  1 g Intravenous Q8H Rande Lawman Rumbarger, RPH   1 g at 08/18/13 1100  . dextrose 5 % and 0.45 % NaCl with KCl 20 mEq/L infusion   Intravenous Continuous Corky Sox, MD 125 mL/hr at 08/18/13 1328    . dextrose 50 % solution 25 mL  25 mL Intravenous PRN Jessee Avers, MD      . enoxaparin (LOVENOX) injection 40 mg  40 mg Subcutaneous Q24H Jessee Avers, MD   40 mg at 08/18/13 1100  . insulin regular (NOVOLIN R,HUMULIN R) 1 Units/mL in sodium chloride 0.9 % 100 mL infusion   Intravenous Continuous Antonietta Breach, PA-C 0.7 mL/hr at 08/18/13 1327    . potassium chloride SA (K-DUR,KLOR-CON) CR tablet 40 mEq  40 mEq Oral Once Corky Sox, MD      . potassium phosphate  30 mmol in dextrose 5 % 500 mL infusion  30 mmol Intravenous Once Corky Sox, MD   30 mmol at 08/18/13 1328  . vancomycin (VANCOCIN) 500 mg in sodium chloride 0.9 % 100 mL IVPB  500 mg Intravenous Q8H Rande Lawman Rumbarger,  RPH   500 mg at 08/18/13 0844    Psychiatric Specialty Exam:     Blood pressure 109/72, pulse 108, temperature 99 F (37.2 C), temperature source Oral, resp. rate 26, height _0  (1.651 m), weight 120 lb (54.432 kg), last menstrual period 07/17/2013, SpO2 100.00%.Body mass index is 19.97 kg/(m^2).  General Appearance: Casual  Eye Contact::  Fair  Speech:  Slow  Volume:  Normal  Mood:  Anxious  Affect:  Congruent  Thought Process:  Intact and Logical  Orientation:  Full (Time, Place, and Person)  Thought Content:  WDL  Suicidal Thoughts:  No  Homicidal Thoughts:  No  Memory:  Immediate;   Fair Recent;   Fair Remote;   Fair  Judgement:  Intact  Insight:  Fair  Psychomotor Activity:  Decreased  Concentration:  Fair  Recall:  [employer] of Knowledge:Fair  Language: Fair  Akathisia:  No  Handed:  Right  AIMS (if indicated):     Assets:  Communication Skills Desire for Improvement Housing Social Support  Sleep:      Musculoskeletal: Strength & Muscle Tone: within normal limits Gait & Station: normal Patient leans: N/A  Treatment Plan Summary: Patient does have capacity to participate in her treatment plan. .  Patient does not need any psychiatric treatment at this time.  We will sign off however if there is any question then please call [phone].  Arlyce Harman Eastin Swing 08/18/2013 4:18 PM

## 2013-08-18 NOTE — Progress Notes (Signed)
  Date: 08/18/2013  Patient name: Ashley Freeman  Medical record number: [id number]  Date of birth: [birth year]   This patient has been seen and the plan of care was discussed with the house staff. Please see their note for complete details. I concur with their findings with the following additions/corrections: Agree with psychiatric consultation to evaluate mental capacity. She is not adequately taking care of herself at home. I'm not sure she has insight into the gravity of her chronic illness. If she does not administer insulin as prescribed to herself due to a lack of understanding of her condition, she will need skilled care since it is not clear her mom or family can help her. She continues to improve. Electrolytes improving. AG normalizing slowly. No clear evidence of infection, but she was noted to have a fever at least twice. BC without growth. It is not unreasonable to continue broad spectrum coverage for now.  Dominic Pea, DO, [facility] 08/18/2013, 11:53 AM

## 2013-08-18 NOTE — Progress Notes (Signed)
Notified Dr. Ronnald Ramp of patient having a red sore located on her right buttocks which is very tender to touch at 1538. Patient states that she did not tell the MD even though her mother told her to inform the doctor of her sore before she arrived to the hospital.Patient stated that she had the sore for a few days. Informed by the mother that her daughter had the sore on her right buttocks for two weeks @ 1615  D. Augustin Coupe, RN

## 2013-08-18 NOTE — Transfer of Care (Signed)
Immediate Anesthesia Transfer of Care Note  Patient: Ashley Freeman  Procedure(s) Performed: Procedure(s): IRRIGATION AND DEBRIDEMENT GLUTEAL ABSCESS (Right)  Patient Location: PACU  Anesthesia Type:General  Level of Consciousness: awake, alert  and oriented  Airway & Oxygen Therapy: Patient Spontanous Breathing and Patient connected to nasal cannula oxygen  Post-op Assessment: Report given to PACU RN and Post -op Vital signs reviewed and stable  Post vital signs: Reviewed and stable  Complications: No apparent anesthesia complications

## 2013-08-18 NOTE — Progress Notes (Signed)
Pt denies pain, only c/o is of itching over her legs. No visible rash or hives, Dr Ola Spurr here and aware. IV Benadryl given with good relief.

## 2013-08-18 NOTE — Progress Notes (Signed)
Per Dr. Ronnald Ramp, insulin dripped held for 2 hours due to potassium being low for 1400-1600, then resume insulin drip D. Augustin Coupe, RN

## 2013-08-18 NOTE — Consult Note (Signed)
Reason for Consult:gluteal abscess Referring Physician: [name] is an 23 y.o. female.  HPI: Pt is a 23 yo/ F who is admitted to the ICU for DKA with poorly controlled DM 1.  Pt states that the gluteal abscess has been bothersome for the last 2 weeks.  She has con't tx for DKA and currently on insulin gtt.  Past Medical History  Diagnosis Date  . Gonorrhea 08/2011    Treated in 09/2011  . Diabetes mellitus 2001    Diagnosed at age 48 ; Type I    Past Surgical History  Procedure Laterality Date  . No past surgeries      Family History  Problem Relation Age of Onset  . Anesthesia problems Neg Hx   . Asthma Mother   . Gout Father   . Diabetes Paternal Grandmother     Social History:  reports that she has never smoked. She has never used smokeless tobacco. She reports that she does not drink alcohol or use illicit drugs.  Allergies:  Allergies  Allergen Reactions  . Penicillins Hives    Medications: I have reviewed the patient's current medications.  Results for orders placed during the hospital encounter of 08/16/13 (from the past 48 hour(s))  CBG MONITORING, ED     Status: Abnormal   Collection Time    08/16/13 10:28 PM      Result Value Ref Range   Glucose-Capillary >600 (*) 70 - 99 mg/dL  CBC WITH DIFFERENTIAL     Status: Abnormal   Collection Time    08/16/13 10:35 PM      Result Value Ref Range   WBC 46.3 (*) 4.0 - 10.5 K/uL   Comment: REPEATED TO VERIFY   RBC 4.73  3.87 - 5.11 MIL/uL   Hemoglobin 13.2  12.0 - 15.0 g/dL   HCT 39.1  36.0 - 46.0 %   MCV 82.7  78.0 - 100.0 fL   MCH 27.9  26.0 - 34.0 pg   MCHC 33.8  30.0 - 36.0 g/dL   Comment: CORRECTED FOR INTERFERING SUBSTANCE   RDW 13.1  11.5 - 15.5 %   Platelets 482 (*) 150 - 400 K/uL   Neutrophils Relative % 85 (*) 43 - 77 %   Lymphocytes Relative 6 (*) 12 - 46 %   Monocytes Relative 9  3 - 12 %   Eosinophils Relative 0  0 - 5 %   Basophils Relative 0  0 - 1 %   Neutro Abs 39.3 (*)  1.7 - 7.7 K/uL   Lymphs Abs 2.8  0.7 - 4.0 K/uL   Monocytes Absolute 4.2 (*) 0.1 - 1.0 K/uL   Eosinophils Absolute 0.0  0.0 - 0.7 K/uL   Basophils Absolute 0.0  0.0 - 0.1 K/uL   RBC Morphology LARGE PLATELETS PRESENT     WBC Morphology MILD LEFT SHIFT (1-5% METAS, OCC MYELO, OCC BANDS)     Comment: TOXIC GRANULATION     VACUOLATED NEUTROPHILS     DOHLE BODIES  BASIC METABOLIC PANEL     Status: Abnormal   Collection Time    08/16/13 10:35 PM      Result Value Ref Range   Sodium 121 (*) 137 - 147 mEq/L   Comment: CRITICAL RESULT CALLED TO, READ BACK BY AND VERIFIED WITH:     MAYS,L RN '@[id number]'  ON 04.19.2015 BY MCREYNOLDS,B   Potassium 5.8 (*) 3.7 - 5.3 mEq/L   Chloride 75 (*) 96 - 112 mEq/L  CO2 <7 (*) 19 - 32 mEq/L   Comment: CRITICAL RESULT CALLED TO, READ BACK BY AND VERIFIED WITH:     MAYS,L RN '@[id number]'  ON 04.19.2015 BY MCREYNOLDS,B   Glucose, Bld 961 (*) 70 - 99 mg/dL   Comment: REPEATED TO VERIFY     CRITICAL RESULT CALLED TO, READ BACK BY AND VERIFIED WITH:     MAYS,L RN '@[id number]'  ON 04.19.2015 BY MCREYNOLDS,B   BUN 18  6 - 23 mg/dL   Creatinine, Ser 0.78  0.50 - 1.10 mg/dL   Calcium 11.1 (*) 8.4 - 10.5 mg/dL   GFR calc non Af Amer >90  >90 mL/min   GFR calc Af Amer >90  >90 mL/min   Comment: (NOTE)     The eGFR has been calculated using the CKD EPI equation.     This calculation has not been validated in all clinical situations.     eGFR's persistently <90 mL/min signify possible Chronic Kidney     Disease.  TROPONIN I     Status: None   Collection Time    08/16/13 10:35 PM      Result Value Ref Range   Troponin I <0.30  <0.30 ng/mL   Comment:            Due to the release kinetics of cTnI,     a negative result within the first hours     of the onset of symptoms does not rule out     myocardial infarction with certainty.     If myocardial infarction is still suspected,     repeat the test at appropriate intervals.  BLOOD GAS, ARTERIAL     Status: Abnormal   Collection  Time    08/16/13 10:45 PM      Result Value Ref Range   FIO2 0.21     Delivery systems ROOM AIR     pH, Arterial 7.162 (*) 7.350 - 7.450   Comment: CRITICAL RESULT CALLED TO, READ BACK BY AND VERIFIED WITH:     KELLY HUMES, PA AT 2252 BY ANNALISSA BAYLOR,RRT,RCP ON 4.     CRITICAL RESULT CALLED TO, READ BACK BY AND VERIFIED WITH:     KELLY HUMES, PA AT 2252 BY ANNALISSA BAYLOR,RRT,RCP ON 08/16/13   pCO2 arterial 7.3 (*) 35.0 - 45.0 mmHg   Comment: CRITICAL RESULT CALLED TO, READ BACK BY AND VERIFIED WITH:     KELLY HUMES, PA AT 2252 BY ANNALISSA BAYLOR,RRT,RCP ON 08/16/13   pO2, Arterial 131.0 (*) 80.0 - 100.0 mmHg   Bicarbonate 2.5 (*) 20.0 - 24.0 mEq/L   TCO2 2.4  0 - 100 mmol/L   Acid-base deficit 26.2 (*) 0.0 - 2.0 mmol/L   O2 Saturation 97.0     Patient temperature 98.6     Collection site RIGHT RADIAL     Drawn by [id number]     Sample type ARTERIAL     Allens test (pass/fail) PASS  PASS  I-STAT CG4 LACTIC ACID, ED     Status: Abnormal   Collection Time    08/16/13 11:10 PM      Result Value Ref Range   Lactic Acid, Venous 4.20 (*) 0.5 - 2.2 mmol/L  URINALYSIS, ROUTINE W REFLEX MICROSCOPIC     Status: Abnormal   Collection Time    08/16/13 11:39 PM      Result Value Ref Range   Color, Urine YELLOW  YELLOW   APPearance CLEAR  CLEAR   Specific Gravity, Urine 1.029  1.005 -  1.030   pH 5.0  5.0 - 8.0   Glucose, UA >1000 (*) NEGATIVE mg/dL   Hgb urine dipstick NEGATIVE  NEGATIVE   Bilirubin Urine NEGATIVE  NEGATIVE   Ketones, ur >80 (*) NEGATIVE mg/dL   Protein, ur 30 (*) NEGATIVE mg/dL   Urobilinogen, UA 0.2  0.0 - 1.0 mg/dL   Nitrite NEGATIVE  NEGATIVE   Leukocytes, UA NEGATIVE  NEGATIVE  PREGNANCY, URINE     Status: None   Collection Time    08/16/13 11:39 PM      Result Value Ref Range   Preg Test, Ur NEGATIVE  NEGATIVE   Comment:            THE SENSITIVITY OF THIS     METHODOLOGY IS >20 mIU/mL.  URINE CULTURE     Status: None   Collection Time    08/16/13 11:39  PM      Result Value Ref Range   Specimen Description URINE, CATHETERIZED     Special Requests NONE     Culture  Setup Time       Value: 08/17/2013 10:14     Performed at [facility]       Value: NO GROWTH     Performed at [facility]   Culture       Value: NO GROWTH     Performed at [facility]   Report Status 08/18/2013 FINAL    URINE MICROSCOPIC-ADD ON     Status: None   Collection Time    08/16/13 11:39 PM      Result Value Ref Range   Urine-Other AMORPHOUS URATES/PHOSPHATES     Comment: MUCOUS PRESENT  CBG MONITORING, ED     Status: Abnormal   Collection Time    08/17/13 12:17 AM      Result Value Ref Range   Glucose-Capillary >600 (*) 70 - 99 mg/dL  CULTURE, BLOOD (ROUTINE X 2)     Status: None   Collection Time    08/17/13  1:00 AM      Result Value Ref Range   Specimen Description BLOOD LEFT FEMORAL VEIN     Special Requests BOTTLES DRAWN AEROBIC ONLY 6CC     Culture  Setup Time       Value: 08/17/2013 10:09     Performed at [facility]   Culture       Value:        BLOOD CULTURE RECEIVED NO GROWTH TO DATE CULTURE WILL BE HELD FOR 5 DAYS BEFORE ISSUING A FINAL NEGATIVE REPORT     Performed at [facility]   Report Status PENDING    CULTURE, BLOOD (ROUTINE X 2)     Status: None   Collection Time    08/17/13  1:00 AM      Result Value Ref Range   Specimen Description BLOOD LEFT FEMORAL ARTERY     Special Requests BOTTLES DRAWN AEROBIC ONLY 5CC     Culture  Setup Time       Value: 08/17/2013 10:09     Performed at [facility]   Culture       Value:        BLOOD CULTURE RECEIVED NO GROWTH TO DATE CULTURE WILL BE HELD FOR 5 DAYS BEFORE ISSUING A FINAL NEGATIVE REPORT     Performed at [facility]   Report Status PENDING    CBG MONITORING, ED     Status:  Abnormal   Collection Time    08/17/13  1:21 AM      Result Value Ref Range   Glucose-Capillary 568 (*) 70 - 99 mg/dL  I-STAT CHEM  8, ED     Status: Abnormal   Collection Time    08/17/13  1:41 AM      Result Value Ref Range   Sodium 131 (*) 137 - 147 mEq/L   Potassium 5.1  3.7 - 5.3 mEq/L   Chloride 104  96 - 112 mEq/L   BUN 16  6 - 23 mg/dL   Creatinine, Ser 0.80  0.50 - 1.10 mg/dL   Glucose, Bld >700 (*) 70 - 99 mg/dL   Calcium, Ion 1.25 (*) 1.12 - 1.23 mmol/L   TCO2 6  0 - 100 mmol/L   Hemoglobin 12.6  12.0 - 15.0 g/dL   HCT 37.0  36.0 - 46.0 %   Comment NOTIFIED PHYSICIAN    CBG MONITORING, ED     Status: Abnormal   Collection Time    08/17/13  2:43 AM      Result Value Ref Range   Glucose-Capillary 477 (*) 70 - 99 mg/dL  GLUCOSE, CAPILLARY     Status: Abnormal   Collection Time    08/17/13  3:20 AM      Result Value Ref Range   Glucose-Capillary 421 (*) 70 - 99 mg/dL  BASIC METABOLIC PANEL     Status: Abnormal   Collection Time    08/17/13  4:12 AM      Result Value Ref Range   Sodium 134 (*) 137 - 147 mEq/L   Potassium 4.3  3.7 - 5.3 mEq/L   Chloride 102  96 - 112 mEq/L   CO2 <7 (*) 19 - 32 mEq/L   Comment: CRITICAL RESULT CALLED TO, READ BACK BY AND VERIFIED WITH:     HAYES C ,RN 08/17/13 0446 WAYK   Glucose, Bld 364 (*) 70 - 99 mg/dL   BUN 14  6 - 23 mg/dL   Creatinine, Ser 0.59  0.50 - 1.10 mg/dL   Calcium 8.8  8.4 - 10.5 mg/dL   GFR calc non Af Amer >90  >90 mL/min   GFR calc Af Amer >90  >90 mL/min   Comment: (NOTE)     The eGFR has been calculated using the CKD EPI equation.     This calculation has not been validated in all clinical situations.     eGFR's persistently <90 mL/min signify possible Chronic Kidney     Disease.  GLUCOSE, CAPILLARY     Status: Abnormal   Collection Time    08/17/13  4:35 AM      Result Value Ref Range   Glucose-Capillary 286 (*) 70 - 99 mg/dL  CBC     Status: Abnormal   Collection Time    08/17/13  4:55 AM      Result Value Ref Range   WBC 45.0 (*) 4.0 - 10.5 K/uL   RBC 3.65 (*) 3.87 - 5.11 MIL/uL   Hemoglobin 10.3 (*) 12.0 - 15.0 g/dL   HCT 30.5  (*) 36.0 - 46.0 %   MCV 83.6  78.0 - 100.0 fL   MCH 28.2  26.0 - 34.0 pg   MCHC 33.8  30.0 - 36.0 g/dL   RDW 13.2  11.5 - 15.5 %   Platelets 391  150 - 400 K/uL  BASIC METABOLIC PANEL     Status: Abnormal   Collection Time  08/17/13  5:30 AM      Result Value Ref Range   Sodium 138  137 - 147 mEq/L   Potassium 3.8  3.7 - 5.3 mEq/L   Chloride 105  96 - 112 mEq/L   CO2 <7 (*) 19 - 32 mEq/L   Comment: CRITICAL RESULT CALLED TO, READ BACK BY AND VERIFIED WITH:     VARNER L,RN 08/17/13 0707 WAYK   Glucose, Bld 246 (*) 70 - 99 mg/dL   BUN 11  6 - 23 mg/dL   Creatinine, Ser 0.49 (*) 0.50 - 1.10 mg/dL   Calcium 8.4  8.4 - 10.5 mg/dL   GFR calc non Af Amer >90  >90 mL/min   GFR calc Af Amer >90  >90 mL/min   Comment: (NOTE)     The eGFR has been calculated using the CKD EPI equation.     This calculation has not been validated in all clinical situations.     eGFR's persistently <90 mL/min signify possible Chronic Kidney     Disease.  GLUCOSE, CAPILLARY     Status: Abnormal   Collection Time    08/17/13  5:33 AM      Result Value Ref Range   Glucose-Capillary 223 (*) 70 - 99 mg/dL  GLUCOSE, CAPILLARY     Status: Abnormal   Collection Time    08/17/13  6:36 AM      Result Value Ref Range   Glucose-Capillary 215 (*) 70 - 99 mg/dL  CBC WITH DIFFERENTIAL     Status: Abnormal   Collection Time    08/17/13  7:48 AM      Result Value Ref Range   WBC 32.1 (*) 4.0 - 10.5 K/uL   RBC 3.38 (*) 3.87 - 5.11 MIL/uL   Hemoglobin 9.3 (*) 12.0 - 15.0 g/dL   HCT 27.6 (*) 36.0 - 46.0 %   MCV 81.7  78.0 - 100.0 fL   MCH 27.5  26.0 - 34.0 pg   MCHC 33.7  30.0 - 36.0 g/dL   RDW 13.2  11.5 - 15.5 %   Platelets 331  150 - 400 K/uL   Neutrophils Relative % 80 (*) 43 - 77 %   Lymphocytes Relative 11 (*) 12 - 46 %   Monocytes Relative 9  3 - 12 %   Eosinophils Relative 0  0 - 5 %   Basophils Relative 0  0 - 1 %   Neutro Abs 25.7 (*) 1.7 - 7.7 K/uL   Lymphs Abs 3.5  0.7 - 4.0 K/uL   Monocytes  Absolute 2.9 (*) 0.1 - 1.0 K/uL   Eosinophils Absolute 0.0  0.0 - 0.7 K/uL   Basophils Absolute 0.0  0.0 - 0.1 K/uL   WBC Morphology TOXIC GRANULATION     Comment: INCREASED BANDS (>20% BANDS)     MILD LEFT SHIFT (1-5% METAS, OCC MYELO, OCC BANDS)  GLUCOSE, CAPILLARY     Status: Abnormal   Collection Time    08/17/13  7:48 AM      Result Value Ref Range   Glucose-Capillary 198 (*) 70 - 99 mg/dL  BASIC METABOLIC PANEL     Status: Abnormal   Collection Time    08/17/13  8:00 AM      Result Value Ref Range   Sodium 140  137 - 147 mEq/L   Potassium 3.7  3.7 - 5.3 mEq/L   Chloride 107  96 - 112 mEq/L   CO2 11 (*) 19 - 32 mEq/L  Glucose, Bld 211 (*) 70 - 99 mg/dL   BUN 9  6 - 23 mg/dL   Creatinine, Ser 0.48 (*) 0.50 - 1.10 mg/dL   Calcium 8.2 (*) 8.4 - 10.5 mg/dL   GFR calc non Af Amer >90  >90 mL/min   GFR calc Af Amer >90  >90 mL/min   Comment: (NOTE)     The eGFR has been calculated using the CKD EPI equation.     This calculation has not been validated in all clinical situations.     eGFR's persistently <90 mL/min signify possible Chronic Kidney     Disease.  LACTIC ACID, PLASMA     Status: None   Collection Time    08/17/13  8:00 AM      Result Value Ref Range   Lactic Acid, Venous 1.3  0.5 - 2.2 mmol/L  CULTURE, BLOOD (ROUTINE X 2)     Status: None   Collection Time    08/17/13  8:52 AM      Result Value Ref Range   Specimen Description BLOOD LEFT ANTECUBITAL     Special Requests BOTTLES DRAWN AEROBIC ONLY [place]     Culture  Setup Time       Value: 08/17/2013 13:30     Performed at [facility]   Culture       Value:        BLOOD CULTURE RECEIVED NO GROWTH TO DATE CULTURE WILL BE HELD FOR 5 DAYS BEFORE ISSUING A FINAL NEGATIVE REPORT     Performed at [facility]   Report Status PENDING    GLUCOSE, CAPILLARY     Status: Abnormal   Collection Time    08/17/13  8:52 AM      Result Value Ref Range   Glucose-Capillary 183 (*) 70 - 99 mg/dL  CULTURE,  BLOOD (ROUTINE X 2)     Status: None   Collection Time    08/17/13  9:00 AM      Result Value Ref Range   Specimen Description BLOOD LEFT HAND     Special Requests BOTTLES DRAWN AEROBIC ONLY [place]     Culture  Setup Time       Value: 08/17/2013 13:31     Performed at [facility]   Culture       Value:        BLOOD CULTURE RECEIVED NO GROWTH TO DATE CULTURE WILL BE HELD FOR 5 DAYS BEFORE ISSUING A FINAL NEGATIVE REPORT     Performed at [facility]   Report Status PENDING    GLUCOSE, CAPILLARY     Status: Abnormal   Collection Time    08/17/13 10:01 AM      Result Value Ref Range   Glucose-Capillary 168 (*) 70 - 99 mg/dL  BASIC METABOLIC PANEL     Status: Abnormal   Collection Time    08/17/13 10:20 AM      Result Value Ref Range   Sodium 139  137 - 147 mEq/L   Potassium 3.5 (*) 3.7 - 5.3 mEq/L   Chloride 106  96 - 112 mEq/L   CO2 15 (*) 19 - 32 mEq/L   Glucose, Bld 181 (*) 70 - 99 mg/dL   BUN 7  6 - 23 mg/dL   Creatinine, Ser 0.44 (*) 0.50 - 1.10 mg/dL   Calcium 8.3 (*) 8.4 - 10.5 mg/dL   GFR calc non Af Amer >90  >90 mL/min   GFR calc Af  Amer >90  >90 mL/min   Comment: (NOTE)     The eGFR has been calculated using the CKD EPI equation.     This calculation has not been validated in all clinical situations.     eGFR's persistently <90 mL/min signify possible Chronic Kidney     Disease.  GLUCOSE, CAPILLARY     Status: Abnormal   Collection Time    08/17/13 10:54 AM      Result Value Ref Range   Glucose-Capillary 161 (*) 70 - 99 mg/dL  GLUCOSE, CAPILLARY     Status: Abnormal   Collection Time    08/17/13 12:01 PM      Result Value Ref Range   Glucose-Capillary 167 (*) 70 - 99 mg/dL  GLUCOSE, CAPILLARY     Status: Abnormal   Collection Time    08/17/13 12:57 PM      Result Value Ref Range   Glucose-Capillary 144 (*) 70 - 99 mg/dL  BASIC METABOLIC PANEL     Status: Abnormal   Collection Time    08/17/13  1:55 PM      Result Value Ref Range   Sodium  138  137 - 147 mEq/L   Potassium 3.0 (*) 3.7 - 5.3 mEq/L   Chloride 103  96 - 112 mEq/L   CO2 19  19 - 32 mEq/L   Glucose, Bld 126 (*) 70 - 99 mg/dL   BUN 7  6 - 23 mg/dL   Creatinine, Ser 0.42 (*) 0.50 - 1.10 mg/dL   Calcium 8.1 (*) 8.4 - 10.5 mg/dL   GFR calc non Af Amer >90  >90 mL/min   GFR calc Af Amer >90  >90 mL/min   Comment: (NOTE)     The eGFR has been calculated using the CKD EPI equation.     This calculation has not been validated in all clinical situations.     eGFR's persistently <90 mL/min signify possible Chronic Kidney     Disease.  MAGNESIUM     Status: None   Collection Time    08/17/13  1:55 PM      Result Value Ref Range   Magnesium 1.5  1.5 - 2.5 mg/dL  PHOSPHORUS     Status: Abnormal   Collection Time    08/17/13  1:55 PM      Result Value Ref Range   Phosphorus <0.5 (*) 2.3 - 4.6 mg/dL   Comment: RESULT REPEATED AND VERIFIED     CRITICAL RESULT CALLED TO, READ BACK BY AND VERIFIED WITH:     T.HARVEY,RN 1457 08/17/13 CLARK,S  GLUCOSE, CAPILLARY     Status: Abnormal   Collection Time    08/17/13  2:05 PM      Result Value Ref Range   Glucose-Capillary 116 (*) 70 - 99 mg/dL  BASIC METABOLIC PANEL     Status: Abnormal   Collection Time    08/17/13  3:00 PM      Result Value Ref Range   Sodium 137  137 - 147 mEq/L   Potassium 3.1 (*) 3.7 - 5.3 mEq/L   Chloride 103  96 - 112 mEq/L   CO2 18 (*) 19 - 32 mEq/L   Glucose, Bld 133 (*) 70 - 99 mg/dL   BUN 6  6 - 23 mg/dL   Creatinine, Ser 0.45 (*) 0.50 - 1.10 mg/dL   Calcium 8.3 (*) 8.4 - 10.5 mg/dL   GFR calc non Af Amer >90  >90 mL/min   GFR calc  Af Amer >90  >90 mL/min   Comment: (NOTE)     The eGFR has been calculated using the CKD EPI equation.     This calculation has not been validated in all clinical situations.     eGFR's persistently <90 mL/min signify possible Chronic Kidney     Disease.  GLUCOSE, CAPILLARY     Status: Abnormal   Collection Time    08/17/13  3:10 PM      Result Value Ref  Range   Glucose-Capillary 206 (*) 70 - 99 mg/dL  GLUCOSE, CAPILLARY     Status: Abnormal   Collection Time    08/17/13  3:55 PM      Result Value Ref Range   Glucose-Capillary 114 (*) 70 - 99 mg/dL  URINALYSIS, ROUTINE W REFLEX MICROSCOPIC     Status: Abnormal   Collection Time    08/17/13  4:05 PM      Result Value Ref Range   Color, Urine YELLOW  YELLOW   APPearance CLOUDY (*) CLEAR   Specific Gravity, Urine 1.025  1.005 - 1.030   pH 6.0  5.0 - 8.0   Glucose, UA >1000 (*) NEGATIVE mg/dL   Hgb urine dipstick TRACE (*) NEGATIVE   Bilirubin Urine NEGATIVE  NEGATIVE   Ketones, ur >80 (*) NEGATIVE mg/dL   Protein, ur 100 (*) NEGATIVE mg/dL   Urobilinogen, UA 0.2  0.0 - 1.0 mg/dL   Nitrite NEGATIVE  NEGATIVE   Leukocytes, UA SMALL (*) NEGATIVE  GC/CHLAMYDIA PROBE AMP     Status: None   Collection Time    08/17/13  4:05 PM      Result Value Ref Range   CT Probe RNA NEGATIVE  NEGATIVE   GC Probe RNA NEGATIVE  NEGATIVE   Comment: (NOTE)                                                                                               **Normal Reference Range: Negative**          Assay performed using the Gen-Probe APTIMA COMBO2 (R) Assay.     Acceptable specimen types for this assay include APTIMA Swabs (Unisex,     endocervical, urethral, or vaginal), first void urine, and ThinPrep     liquid based cytology samples.     Performed at [facility]     Status: Abnormal   Collection Time    08/17/13  4:05 PM      Result Value Ref Range   Squamous Epithelial / LPF FEW (*) RARE   WBC, UA 7-10  <3 WBC/hpf   RBC / HPF 0-2  <3 RBC/hpf   Bacteria, UA FEW (*) RARE   Casts HYALINE CASTS (*) NEGATIVE   Comment: GRANULAR CAST   Urine-Other AMORPHOUS URATES/PHOSPHATES     Comment: TRICHOMONAS PRESENT  LACTIC ACID, PLASMA     Status: None   Collection Time    08/17/13  5:00 PM      Result Value Ref Range   Lactic Acid, Venous 1.1  0.5 - 2.2 mmol/L    GLUCOSE, CAPILLARY  Status: Abnormal   Collection Time    08/17/13  5:15 PM      Result Value Ref Range   Glucose-Capillary 204 (*) 70 - 99 mg/dL  CBC WITH DIFFERENTIAL     Status: Abnormal   Collection Time    08/17/13  5:52 PM      Result Value Ref Range   WBC 26.3 (*) 4.0 - 10.5 K/uL   RBC 3.25 (*) 3.87 - 5.11 MIL/uL   Hemoglobin 9.0 (*) 12.0 - 15.0 g/dL   HCT 26.1 (*) 36.0 - 46.0 %   MCV 80.3  78.0 - 100.0 fL   MCH 27.7  26.0 - 34.0 pg   MCHC 34.5  30.0 - 36.0 g/dL   RDW 12.7  11.5 - 15.5 %   Platelets 327  150 - 400 K/uL   Neutrophils Relative % 78 (*) 43 - 77 %   Lymphocytes Relative 14  12 - 46 %   Monocytes Relative 8  3 - 12 %   Eosinophils Relative 0  0 - 5 %   Basophils Relative 0  0 - 1 %   Neutro Abs 20.5 (*) 1.7 - 7.7 K/uL   Lymphs Abs 3.7  0.7 - 4.0 K/uL   Monocytes Absolute 2.1 (*) 0.1 - 1.0 K/uL   Eosinophils Absolute 0.0  0.0 - 0.7 K/uL   Basophils Absolute 0.0  0.0 - 0.1 K/uL   RBC Morphology BURR CELLS     WBC Morphology TOXIC GRANULATION     Comment: MILD LEFT SHIFT (1-5% METAS, OCC MYELO, OCC BANDS)  BASIC METABOLIC PANEL     Status: Abnormal   Collection Time    08/17/13  5:52 PM      Result Value Ref Range   Sodium 134 (*) 137 - 147 mEq/L   Potassium 3.6 (*) 3.7 - 5.3 mEq/L   Chloride 100  96 - 112 mEq/L   CO2 15 (*) 19 - 32 mEq/L   Glucose, Bld 247 (*) 70 - 99 mg/dL   BUN 6  6 - 23 mg/dL   Creatinine, Ser 0.46 (*) 0.50 - 1.10 mg/dL   Calcium 8.1 (*) 8.4 - 10.5 mg/dL   GFR calc non Af Amer >90  >90 mL/min   GFR calc Af Amer >90  >90 mL/min   Comment: (NOTE)     The eGFR has been calculated using the CKD EPI equation.     This calculation has not been validated in all clinical situations.     eGFR's persistently <90 mL/min signify possible Chronic Kidney     Disease.  GLUCOSE, CAPILLARY     Status: Abnormal   Collection Time    08/17/13  6:54 PM      Result Value Ref Range   Glucose-Capillary 334 (*) 70 - 99 mg/dL  COMPREHENSIVE  METABOLIC PANEL     Status: Abnormal   Collection Time    08/17/13  7:55 PM      Result Value Ref Range   Sodium 131 (*) 137 - 147 mEq/L   Potassium 3.2 (*) 3.7 - 5.3 mEq/L   Chloride 96  96 - 112 mEq/L   CO2 9 (*) 19 - 32 mEq/L   Comment: CRITICAL RESULT CALLED TO, READ BACK BY AND VERIFIED WITH:     L VARNER,RN 2030 08/17/13 D BRADLEY   Glucose, Bld 353 (*) 70 - 99 mg/dL   BUN 7  6 - 23 mg/dL   Creatinine, Ser 0.48 (*) 0.50 -  1.10 mg/dL   Calcium 7.9 (*) 8.4 - 10.5 mg/dL   Total Protein 6.1  6.0 - 8.3 g/dL   Albumin 1.9 (*) 3.5 - 5.2 g/dL   AST 13  0 - 37 U/L   ALT 6  0 - 35 U/L   Alkaline Phosphatase 142 (*) 39 - 117 U/L   Total Bilirubin <0.2 (*) 0.3 - 1.2 mg/dL   GFR calc non Af Amer >90  >90 mL/min   GFR calc Af Amer >90  >90 mL/min   Comment: (NOTE)     The eGFR has been calculated using the CKD EPI equation.     This calculation has not been validated in all clinical situations.     eGFR's persistently <90 mL/min signify possible Chronic Kidney     Disease.  GLUCOSE, CAPILLARY     Status: Abnormal   Collection Time    08/17/13  7:55 PM      Result Value Ref Range   Glucose-Capillary 323 (*) 70 - 99 mg/dL  GLUCOSE, CAPILLARY     Status: Abnormal   Collection Time    08/17/13  8:58 PM      Result Value Ref Range   Glucose-Capillary 281 (*) 70 - 99 mg/dL  BASIC METABOLIC PANEL     Status: Abnormal   Collection Time    08/17/13  9:31 PM      Result Value Ref Range   Sodium 135 (*) 137 - 147 mEq/L   Potassium 3.3 (*) 3.7 - 5.3 mEq/L   Chloride 100  96 - 112 mEq/L   CO2 13 (*) 19 - 32 mEq/L   Glucose, Bld 240 (*) 70 - 99 mg/dL   BUN 6  6 - 23 mg/dL   Creatinine, Ser 0.47 (*) 0.50 - 1.10 mg/dL   Calcium 8.4  8.4 - 10.5 mg/dL   GFR calc non Af Amer >90  >90 mL/min   GFR calc Af Amer >90  >90 mL/min   Comment: (NOTE)     The eGFR has been calculated using the CKD EPI equation.     This calculation has not been validated in all clinical situations.     eGFR's  persistently <90 mL/min signify possible Chronic Kidney     Disease.  GLUCOSE, CAPILLARY     Status: Abnormal   Collection Time    08/17/13  9:47 PM      Result Value Ref Range   Glucose-Capillary 208 (*) 70 - 99 mg/dL  BASIC METABOLIC PANEL     Status: Abnormal   Collection Time    08/17/13  9:51 PM      Result Value Ref Range   Sodium 133 (*) 137 - 147 mEq/L   Potassium 3.6 (*) 3.7 - 5.3 mEq/L   Chloride 99  96 - 112 mEq/L   CO2 14 (*) 19 - 32 mEq/L   Glucose, Bld 222 (*) 70 - 99 mg/dL   BUN 5 (*) 6 - 23 mg/dL   Creatinine, Ser 0.50  0.50 - 1.10 mg/dL   Calcium 8.2 (*) 8.4 - 10.5 mg/dL   GFR calc non Af Amer >90  >90 mL/min   GFR calc Af Amer >90  >90 mL/min   Comment: (NOTE)     The eGFR has been calculated using the CKD EPI equation.     This calculation has not been validated in all clinical situations.     eGFR's persistently <90 mL/min signify possible Chronic Kidney  Disease.  MAGNESIUM     Status: Abnormal   Collection Time    08/17/13  9:51 PM      Result Value Ref Range   Magnesium 2.8 (*) 1.5 - 2.5 mg/dL  PHOSPHORUS     Status: Abnormal   Collection Time    08/17/13  9:51 PM      Result Value Ref Range   Phosphorus 1.2 (*) 2.3 - 4.6 mg/dL  GLUCOSE, CAPILLARY     Status: Abnormal   Collection Time    08/17/13 10:53 PM      Result Value Ref Range   Glucose-Capillary 160 (*) 70 - 99 mg/dL  BASIC METABOLIC PANEL     Status: Abnormal   Collection Time    08/18/13 12:00 AM      Result Value Ref Range   Sodium 134 (*) 137 - 147 mEq/L   Potassium 3.7  3.7 - 5.3 mEq/L   Chloride 103  96 - 112 mEq/L   CO2 18 (*) 19 - 32 mEq/L   Glucose, Bld 104 (*) 70 - 99 mg/dL   BUN 4 (*) 6 - 23 mg/dL   Creatinine, Ser 0.44 (*) 0.50 - 1.10 mg/dL   Calcium 8.3 (*) 8.4 - 10.5 mg/dL   GFR calc non Af Amer >90  >90 mL/min   GFR calc Af Amer >90  >90 mL/min   Comment: (NOTE)     The eGFR has been calculated using the CKD EPI equation.     This calculation has not been  validated in all clinical situations.     eGFR's persistently <90 mL/min signify possible Chronic Kidney     Disease.  GLUCOSE, CAPILLARY     Status: Abnormal   Collection Time    08/18/13 12:01 AM      Result Value Ref Range   Glucose-Capillary 111 (*) 70 - 99 mg/dL  GLUCOSE, CAPILLARY     Status: Abnormal   Collection Time    08/18/13  1:03 AM      Result Value Ref Range   Glucose-Capillary 113 (*) 70 - 99 mg/dL  GLUCOSE, CAPILLARY     Status: None   Collection Time    08/18/13  1:59 AM      Result Value Ref Range   Glucose-Capillary 72  70 - 99 mg/dL  BASIC METABOLIC PANEL     Status: Abnormal   Collection Time    08/18/13  2:09 AM      Result Value Ref Range   Sodium 135 (*) 137 - 147 mEq/L   Potassium 3.7  3.7 - 5.3 mEq/L   Chloride 103  96 - 112 mEq/L   CO2 18 (*) 19 - 32 mEq/L   Glucose, Bld 71  70 - 99 mg/dL   BUN 5 (*) 6 - 23 mg/dL   Creatinine, Ser 0.45 (*) 0.50 - 1.10 mg/dL   Calcium 8.2 (*) 8.4 - 10.5 mg/dL   GFR calc non Af Amer >90  >90 mL/min   GFR calc Af Amer >90  >90 mL/min   Comment: (NOTE)     The eGFR has been calculated using the CKD EPI equation.     This calculation has not been validated in all clinical situations.     eGFR's persistently <90 mL/min signify possible Chronic Kidney     Disease.  GLUCOSE, CAPILLARY     Status: Abnormal   Collection Time    08/18/13  2:55 AM      Result Value  Ref Range   Glucose-Capillary 128 (*) 70 - 99 mg/dL  BASIC METABOLIC PANEL     Status: Abnormal   Collection Time    08/18/13  3:21 AM      Result Value Ref Range   Sodium 135 (*) 137 - 147 mEq/L   Potassium 3.4 (*) 3.7 - 5.3 mEq/L   Chloride 103  96 - 112 mEq/L   CO2 15 (*) 19 - 32 mEq/L   Glucose, Bld 160 (*) 70 - 99 mg/dL   BUN 5 (*) 6 - 23 mg/dL   Creatinine, Ser 0.37 (*) 0.50 - 1.10 mg/dL   Calcium 8.5  8.4 - 10.5 mg/dL   GFR calc non Af Amer >90  >90 mL/min   GFR calc Af Amer >90  >90 mL/min   Comment: (NOTE)     The eGFR has been calculated  using the CKD EPI equation.     This calculation has not been validated in all clinical situations.     eGFR's persistently <90 mL/min signify possible Chronic Kidney     Disease.  CBC     Status: Abnormal   Collection Time    08/18/13  3:21 AM      Result Value Ref Range   WBC 25.3 (*) 4.0 - 10.5 K/uL   RBC 3.27 (*) 3.87 - 5.11 MIL/uL   Hemoglobin 9.1 (*) 12.0 - 15.0 g/dL   HCT 26.0 (*) 36.0 - 46.0 %   MCV 79.5  78.0 - 100.0 fL   MCH 27.8  26.0 - 34.0 pg   MCHC 35.0  30.0 - 36.0 g/dL   RDW 12.8  11.5 - 15.5 %   Platelets 326  150 - 400 K/uL  MAGNESIUM     Status: None   Collection Time    08/18/13  3:21 AM      Result Value Ref Range   Magnesium 2.5  1.5 - 2.5 mg/dL  GLUCOSE, CAPILLARY     Status: Abnormal   Collection Time    08/18/13  4:01 AM      Result Value Ref Range   Glucose-Capillary 162 (*) 70 - 99 mg/dL  GLUCOSE, CAPILLARY     Status: Abnormal   Collection Time    08/18/13  5:04 AM      Result Value Ref Range   Glucose-Capillary 164 (*) 70 - 99 mg/dL  BASIC METABOLIC PANEL     Status: Abnormal   Collection Time    08/18/13  5:55 AM      Result Value Ref Range   Sodium 136 (*) 137 - 147 mEq/L   Potassium 3.3 (*) 3.7 - 5.3 mEq/L   Chloride 102  96 - 112 mEq/L   CO2 18 (*) 19 - 32 mEq/L   Glucose, Bld 160 (*) 70 - 99 mg/dL   BUN 4 (*) 6 - 23 mg/dL   Creatinine, Ser 0.48 (*) 0.50 - 1.10 mg/dL   Calcium 8.1 (*) 8.4 - 10.5 mg/dL   GFR calc non Af Amer >90  >90 mL/min   GFR calc Af Amer >90  >90 mL/min   Comment: (NOTE)     The eGFR has been calculated using the CKD EPI equation.     This calculation has not been validated in all clinical situations.     eGFR's persistently <90 mL/min signify possible Chronic Kidney     Disease.  GLUCOSE, CAPILLARY     Status: Abnormal   Collection Time    08/18/13  6:04 AM      Result Value Ref Range   Glucose-Capillary 148 (*) 70 - 99 mg/dL  GLUCOSE, CAPILLARY     Status: Abnormal   Collection Time    08/18/13  7:02 AM        Result Value Ref Range   Glucose-Capillary 169 (*) 70 - 99 mg/dL  GLUCOSE, CAPILLARY     Status: Abnormal   Collection Time    08/18/13  7:37 AM      Result Value Ref Range   Glucose-Capillary 173 (*) 70 - 99 mg/dL  BASIC METABOLIC PANEL     Status: Abnormal   Collection Time    08/18/13  7:55 AM      Result Value Ref Range   Sodium 134 (*) 137 - 147 mEq/L   Potassium 3.8  3.7 - 5.3 mEq/L   Chloride 102  96 - 112 mEq/L   CO2 19  19 - 32 mEq/L   Glucose, Bld 170 (*) 70 - 99 mg/dL   BUN 4 (*) 6 - 23 mg/dL   Creatinine, Ser 0.44 (*) 0.50 - 1.10 mg/dL   Calcium 8.2 (*) 8.4 - 10.5 mg/dL   GFR calc non Af Amer >90  >90 mL/min   GFR calc Af Amer >90  >90 mL/min   Comment: (NOTE)     The eGFR has been calculated using the CKD EPI equation.     This calculation has not been validated in all clinical situations.     eGFR's persistently <90 mL/min signify possible Chronic Kidney     Disease.  GLUCOSE, CAPILLARY     Status: Abnormal   Collection Time    08/18/13  8:55 AM      Result Value Ref Range   Glucose-Capillary 159 (*) 70 - 99 mg/dL  BASIC METABOLIC PANEL     Status: Abnormal   Collection Time    08/18/13  9:55 AM      Result Value Ref Range   Sodium 134 (*) 137 - 147 mEq/L   Potassium 3.6 (*) 3.7 - 5.3 mEq/L   Chloride 101  96 - 112 mEq/L   CO2 18 (*) 19 - 32 mEq/L   Glucose, Bld 174 (*) 70 - 99 mg/dL   BUN 4 (*) 6 - 23 mg/dL   Creatinine, Ser 0.46 (*) 0.50 - 1.10 mg/dL   Calcium 8.3 (*) 8.4 - 10.5 mg/dL   GFR calc non Af Amer >90  >90 mL/min   GFR calc Af Amer >90  >90 mL/min   Comment: (NOTE)     The eGFR has been calculated using the CKD EPI equation.     This calculation has not been validated in all clinical situations.     eGFR's persistently <90 mL/min signify possible Chronic Kidney     Disease.  PHOSPHORUS     Status: Abnormal   Collection Time    08/18/13  9:55 AM      Result Value Ref Range   Phosphorus <0.5 (*) 2.3 - 4.6 mg/dL   Comment: CRITICAL  RESULT CALLED TO, READ BACK BY AND VERIFIED WITH:     MOYER,P RN @ 1200 08/18/13 LEONARD,A  GLUCOSE, CAPILLARY     Status: Abnormal   Collection Time    08/18/13 10:05 AM      Result Value Ref Range   Glucose-Capillary 175 (*) 70 - 99 mg/dL  GLUCOSE, CAPILLARY     Status: Abnormal   Collection Time    08/18/13 11:09  AM      Result Value Ref Range   Glucose-Capillary 176 (*) 70 - 99 mg/dL  BASIC METABOLIC PANEL     Status: Abnormal   Collection Time    08/18/13 11:55 AM      Result Value Ref Range   Sodium 134 (*) 137 - 147 mEq/L   Potassium 3.3 (*) 3.7 - 5.3 mEq/L   Chloride 101  96 - 112 mEq/L   CO2 19  19 - 32 mEq/L   Glucose, Bld 152 (*) 70 - 99 mg/dL   BUN 3 (*) 6 - 23 mg/dL   Creatinine, Ser 0.43 (*) 0.50 - 1.10 mg/dL   Calcium 8.2 (*) 8.4 - 10.5 mg/dL   GFR calc non Af Amer >90  >90 mL/min   GFR calc Af Amer >90  >90 mL/min   Comment: (NOTE)     The eGFR has been calculated using the CKD EPI equation.     This calculation has not been validated in all clinical situations.     eGFR's persistently <90 mL/min signify possible Chronic Kidney     Disease.  GLUCOSE, CAPILLARY     Status: Abnormal   Collection Time    08/18/13 12:21 PM      Result Value Ref Range   Glucose-Capillary 156 (*) 70 - 99 mg/dL  BASIC METABOLIC PANEL     Status: Abnormal   Collection Time    08/18/13  1:20 PM      Result Value Ref Range   Sodium 133 (*) 137 - 147 mEq/L   Potassium 3.3 (*) 3.7 - 5.3 mEq/L   Chloride 102  96 - 112 mEq/L   CO2 19  19 - 32 mEq/L   Glucose, Bld 151 (*) 70 - 99 mg/dL   BUN 3 (*) 6 - 23 mg/dL   Creatinine, Ser 0.37 (*) 0.50 - 1.10 mg/dL   Calcium 8.6  8.4 - 10.5 mg/dL   GFR calc non Af Amer >90  >90 mL/min   GFR calc Af Amer >90  >90 mL/min   Comment: (NOTE)     The eGFR has been calculated using the CKD EPI equation.     This calculation has not been validated in all clinical situations.     eGFR's persistently <90 mL/min signify possible Chronic Kidney      Disease.  MAGNESIUM     Status: None   Collection Time    08/18/13  1:20 PM      Result Value Ref Range   Magnesium 2.1  1.5 - 2.5 mg/dL  PHOSPHORUS     Status: Abnormal   Collection Time    08/18/13  1:20 PM      Result Value Ref Range   Phosphorus 0.6 (*) 2.3 - 4.6 mg/dL   Comment: CRITICAL RESULT CALLED TO, READ BACK BY AND VERIFIED WITH:     Priscella Mann RN AT [phone] 08/18/13 BY ZBEECH.  GLUCOSE, CAPILLARY     Status: Abnormal   Collection Time    08/18/13  1:24 PM      Result Value Ref Range   Glucose-Capillary 129 (*) 70 - 99 mg/dL  GLUCOSE, CAPILLARY     Status: Abnormal   Collection Time    08/18/13  2:29 PM      Result Value Ref Range   Glucose-Capillary 255 (*) 70 - 99 mg/dL  BASIC METABOLIC PANEL     Status: Abnormal   Collection Time    08/18/13  3:55 PM  Result Value Ref Range   Sodium 130 (*) 137 - 147 mEq/L   Potassium 4.3  3.7 - 5.3 mEq/L   Chloride 96  96 - 112 mEq/L   CO2 14 (*) 19 - 32 mEq/L   Glucose, Bld 421 (*) 70 - 99 mg/dL   BUN 5 (*) 6 - 23 mg/dL   Creatinine, Ser 0.43 (*) 0.50 - 1.10 mg/dL   Calcium 8.3 (*) 8.4 - 10.5 mg/dL   GFR calc non Af Amer >90  >90 mL/min   GFR calc Af Amer >90  >90 mL/min   Comment: (NOTE)     The eGFR has been calculated using the CKD EPI equation.     This calculation has not been validated in all clinical situations.     eGFR's persistently <90 mL/min signify possible Chronic Kidney     Disease.  GLUCOSE, CAPILLARY     Status: Abnormal   Collection Time    08/18/13  3:58 PM      Result Value Ref Range   Glucose-Capillary 400 (*) 70 - 99 mg/dL    Dg Chest 2 View  08/17/2013   CLINICAL DATA:  Hyperglycemia diabetes shortness of breath.  EXAM: CHEST  2 VIEW  COMPARISON:  08/07/2013  FINDINGS: The heart size and mediastinal contours are within normal limits. Both lungs are clear. The visualized skeletal structures are unremarkable.  IMPRESSION: No active cardiopulmonary disease.   Electronically Signed   By: Lucienne Capers M.D.   On: 08/17/2013 00:05    Review of Systems  Constitutional: Positive for fever. Negative for weight loss.  HENT: Negative for ear discharge, ear pain, hearing loss and tinnitus.   Eyes: Negative for blurred vision, double vision, photophobia and pain.  Respiratory: Negative for cough, sputum production and shortness of breath.   Cardiovascular: Negative for chest pain.  Gastrointestinal: Negative.  Negative for nausea, vomiting and abdominal pain.  Genitourinary: Negative for dysuria, urgency, frequency and flank pain.  Musculoskeletal: Negative for back pain, falls, joint pain, myalgias and neck pain.  Neurological: Negative for dizziness, tingling, sensory change, focal weakness, loss of consciousness and headaches.  Endo/Heme/Allergies: Does not bruise/bleed easily.  Psychiatric/Behavioral: Negative for depression, memory loss and substance abuse. The patient is not nervous/anxious.    Blood pressure 107/71, pulse 109, temperature 99 F (37.2 C), temperature source Oral, resp. rate 16, height '5\' 5"'  (1.651 m), weight 120 lb (54.432 kg), last menstrual period 07/17/2013, SpO2 100.00%. Physical Exam  Vitals reviewed. Constitutional: She is oriented to person, place, and time. She appears well-developed and well-nourished. She is cooperative. No distress. Cervical collar and nasal cannula in place.  HENT:  Head: Normocephalic and atraumatic. Head is without raccoon's eyes, without Battle's sign, without abrasion, without contusion and without laceration.  Right Ear: Hearing, tympanic membrane, external ear and ear canal normal. No lacerations. No drainage or tenderness. No foreign bodies. Tympanic membrane is not perforated. No hemotympanum.  Left Ear: Hearing, tympanic membrane, external ear and ear canal normal. No lacerations. No drainage or tenderness. No foreign bodies. Tympanic membrane is not perforated. No hemotympanum.  Nose: Nose normal. No nose lacerations, sinus  tenderness, nasal deformity or nasal septal hematoma. No epistaxis.  Mouth/Throat: Uvula is midline, oropharynx is clear and moist and mucous membranes are normal. No lacerations.  Eyes: Conjunctivae, EOM and lids are normal. Pupils are equal, round, and reactive to light. No scleral icterus.  Neck: Trachea normal. No JVD present. No spinous process tenderness and no muscular tenderness  present. Carotid bruit is not present. No thyromegaly present.  Cardiovascular: Normal rate, regular rhythm, normal heart sounds, intact distal pulses and normal pulses.   Respiratory: Effort normal and breath sounds normal. No respiratory distress. She exhibits no tenderness, no bony tenderness, no laceration and no crepitus.  GI: Normal appearance and bowel sounds are normal. She exhibits no distension. There is no tenderness. There is no rigidity, no rebound, no guarding and no CVA tenderness.  Genitourinary:     Musculoskeletal: Normal range of motion. She exhibits no edema and no tenderness.  Lymphadenopathy:    She has no cervical adenopathy.  Neurological: She is alert and oriented to person, place, and time. She has normal strength. No cranial nerve deficit or sensory deficit. GCS eye subscore is 4. GCS verbal subscore is 5. GCS motor subscore is 6.  Skin: Skin is warm, dry and intact. She is not diaphoretic.  Psychiatric: She has a normal mood and affect. Her speech is normal and behavior is normal.    Assessment/Plan: 23 y/o F with DM1, DKA, and right gluteal abscess.   Abscess is likely worsening her DKA at this time. Will proceed to OR for I&D of gluteal abscess.  Ashley Freeman 08/18/2013, 6:10 PM

## 2013-08-18 NOTE — Anesthesia Procedure Notes (Addendum)
Procedure Name: LMA Insertion Date/Time: 08/18/2013 7:22 PM Performed by: Storm Frisk E Pre-anesthesia Checklist: Patient identified, Timeout performed, Emergency Drugs available, Suction available and Patient being monitored Patient Re-evaluated:Patient Re-evaluated prior to inductionOxygen Delivery Method: Circle system utilized Preoxygenation: Pre-oxygenation with 100% oxygen Intubation Type: IV induction LMA: LMA inserted LMA Size: 4.0 Number of attempts: 1 Tube secured with: Tape Dental Injury: Teeth and Oropharynx as per pre-operative assessment

## 2013-08-18 NOTE — Anesthesia Preprocedure Evaluation (Addendum)
Anesthesia Evaluation  Patient identified by MRN, date of birth, ID band Patient awake    Reviewed: Allergy & Precautions, H&P , NPO status , Patient's Chart, lab work & pertinent test results  Airway Mallampati: I TM Distance: >3 FB Neck ROM: Full    Dental no notable dental hx. (+) Teeth Intact, Dental Advisory Given   Pulmonary neg pulmonary ROS,    Pulmonary exam normal       Cardiovascular negative cardio ROS  Rhythm:Regular Rate:Normal     Neuro/Psych negative neurological ROS  negative psych ROS   GI/Hepatic negative GI ROS, Neg liver ROS,   Endo/Other  diabetes, Poorly Controlled, Insulin Dependent  Renal/GU negative Renal ROS  negative genitourinary   Musculoskeletal   Abdominal   Peds  Hematology negative hematology ROS (+)   Anesthesia Other Findings   Reproductive/Obstetrics negative OB ROS                          Anesthesia Physical Anesthesia Plan  ASA: III  Anesthesia Plan: General   Post-op Pain Management:    Induction: Intravenous, Rapid sequence and Cricoid pressure planned  Airway Management Planned: Oral ETT  Additional Equipment:   Intra-op Plan:   Post-operative Plan: Extubation in OR  Informed Consent: I have reviewed the patients History and Physical, chart, labs and discussed the procedure including the risks, benefits and alternatives for the proposed anesthesia with the patient or authorized representative who has indicated his/her understanding and acceptance.   Dental advisory given  Plan Discussed with: CRNA  Anesthesia Plan Comments:         Anesthesia Quick Evaluation

## 2013-08-18 NOTE — Progress Notes (Signed)
cCRITICAL VALUE ALERT  Critical value received:  Phosphate less than 0.5  Date of notification:  08/18/2013  Time of notification: 1201  Critical value read back:Yes  Nurse who received alert: D. Augustin Coupe  MD notified (1st page):  Dr. Ronnald Ramp  Time of first page:  1205  MD notified (2nd page):  Time of second page:  Responding MD:  Dr. Ronnald Ramp  Time MD responded:  [phone]

## 2013-08-18 NOTE — Progress Notes (Signed)
CRITICAL VALUE ALERT  Critical value received:  Phosphate 0.6  Date of notification:  08/18/2013  Time of notification:  [id number]  Critical value read back:yes  Nurse who received alert:  D. Augustin Coupe, RN  MD notified (1st page):  Dr. Ronnald Ramp  Time of first page:  37  MD notified (2nd page):  Time of second page:  Responding MD:  Dr. Ronnald Ramp  Time MD responded:  1440, md already ordered medication

## 2013-08-18 NOTE — Anesthesia Postprocedure Evaluation (Signed)
  Anesthesia Post-op Note  Patient: Ashley Freeman  Procedure(s) Performed: Procedure(s): IRRIGATION AND DEBRIDEMENT GLUTEAL ABSCESS (Right)  Patient Location: PACU  Anesthesia Type:General  Level of Consciousness: awake and alert   Airway and Oxygen Therapy: Patient Spontanous Breathing  Post-op Pain: none  Post-op Assessment: Post-op Vital signs reviewed, Patient's Cardiovascular Status Stable and Respiratory Function Stable  Post-op Vital Signs: Reviewed  Filed Vitals:   08/18/13 2028  BP:   Pulse: 96  Temp: 36.9 C  Resp: 21    Complications: No apparent anesthesia complications

## 2013-08-18 NOTE — Progress Notes (Signed)
Subjective: Patient seen at bedside this AM. Clinical exam generally unchanged. Patient does not have any complaints. Says she is hungry today. Still somewhat tearful. Denies dysuria, diarrhea, cough, headaches, neck pain, or photophobia.  Objective: Vital signs in last 24 hours: Filed Vitals:   08/18/13 0345 08/18/13 0400 08/18/13 0500 08/18/13 0600  BP:  111/72 127/75 112/68  Pulse:  104 107 103  Temp: 98.4 F (36.9 C)     TempSrc: Oral     Resp:  12 19 32  Height:      Weight:      SpO2:  99% 100% 99%   Weight change:   Intake/Output Summary (Last 24 hours) at 08/18/13 0730 Last data filed at 08/18/13 0400  Gross per 24 hour  Intake 5065.14 ml  Output   1900 ml  Net 3165.14 ml   Physical Exam: General: Alert, cooperative, tearful on exam.  HEENT: Unwilling to open eyes. Moist mucus membranes Neck: Full range of motion without pain, supple, no lymphadenopathy or carotid bruits Lungs: Clear to ascultation bilaterally, normal work of respiration, no wheezes, rales, rhonchi Heart: Tachycardic, regular rhythm, no murmurs, gallops, or rubs Abdomen: Soft, non-tender, non-distended, BS + Extremities: No cyanosis, clubbing, or edema Neurologic: Alert & oriented X3, cranial nerves II-XII intact, strength grossly intact, sensation intact to light touch  Lab Results: Basic Metabolic Panel:  Recent Labs Lab 08/17/13 1355  08/17/13 2151  08/18/13 0321 08/18/13 0555  NA 138  < > 133*  < > 135* 136*  K 3.0*  < > 3.6*  < > 3.4* 3.3*  CL 103  < > 99  < > 103 102  CO2 19  < > 14*  < > 15* 18*  GLUCOSE 126*  < > 222*  < > 160* 160*  BUN 7  < > 5*  < > 5* 4*  CREATININE 0.42*  < > 0.50  < > 0.37* 0.48*  CALCIUM 8.1*  < > 8.2*  < > 8.5 8.1*  MG 1.5  --  2.8*  --  2.5  --   PHOS <0.5*  --  1.2*  --   --   --   < > = values in this interval not displayed.  CBC:  Recent Labs Lab 08/17/13 0748 08/17/13 1752 08/18/13 0321  WBC 32.1* 26.3* 25.3*  NEUTROABS 25.7* 20.5*  --    HGB 9.3* 9.0* 9.1*  HCT 27.6* 26.1* 26.0*  MCV 81.7 80.3 79.5  PLT 331 327 326    Cardiac Enzymes:  Recent Labs Lab 08/16/13 2235  TROPONINI <0.30   CBG:  Recent Labs Lab 08/18/13 0159 08/18/13 0255 08/18/13 0401 08/18/13 0504 08/18/13 0604 08/18/13 0702  GLUCAP 72 128* 162* 164* 148* 169*   Urinalysis:  Recent Labs Lab 08/16/13 2339 08/17/13 1605  COLORURINE YELLOW YELLOW  LABSPEC 1.029 1.025  PHURINE 5.0 6.0  GLUCOSEU >1000* >1000*  HGBUR NEGATIVE TRACE*  BILIRUBINUR NEGATIVE NEGATIVE  KETONESUR >80* >80*  PROTEINUR 30* 100*  UROBILINOGEN 0.2 0.2  NITRITE NEGATIVE NEGATIVE  LEUKOCYTESUR NEGATIVE SMALL*    Micro Results: Recent Results (from the past 240 hour(s))  CULTURE, BLOOD (ROUTINE X 2)     Status: None   Collection Time    08/17/13  9:00 AM      Result Value Ref Range Status   Specimen Description BLOOD LEFT HAND   Final   Special Requests BOTTLES DRAWN AEROBIC ONLY [facility]   Final   Culture PENDING   Incomplete  Report Status PENDING   Incomplete   Studies/Results: Dg Chest 2 View  08/17/2013   CLINICAL DATA:  Hyperglycemia diabetes shortness of breath.  EXAM: CHEST  2 VIEW  COMPARISON:  08/07/2013  FINDINGS: The heart size and mediastinal contours are within normal limits. Both lungs are clear. The visualized skeletal structures are unremarkable.  IMPRESSION: No active cardiopulmonary disease.   Electronically Signed   By: Lucienne Capers M.D.   On: 08/17/2013 00:05   Medications: I have reviewed the patient's current medications. Scheduled Meds: . ceFEPime (MAXIPIME) IV  1 g Intravenous Q8H  . enoxaparin (LOVENOX) injection  40 mg Subcutaneous Q24H  . potassium chloride  10 mEq Intravenous Q1 Hr x 4  . vancomycin  500 mg Intravenous Q8H   Continuous Infusions: . dextrose 5 % and 0.45% NaCl 125 mL/hr at 08/17/13 2150  . insulin (NOVOLIN-R) infusion 2.2 Units/hr (08/18/13 0702)   PRN Meds:.acetaminophen, dextrose  Assessment/Plan: Ms.  Ashley Freeman is a 23 y.o. female w/ PMHx of Type I DM w/ multiple admissions for severe DKA, admitted for DKA.  Diabetic Ketoacidosis- Severely uncontrolled Type I DM, most recent HbA1c 19.3%. Admission, ABG showed pH 7.16, pCO2 7.3, pO2 131, HCO3 2.5. Exam generally unchanged. Patient w/ labile AG overnight 2/2 alterations in Insulin gtt rate d/t potassium and k-phos administration, as well as hypoglycemia despite being on D5 1/2 NS. Most recent BMP shows AG of 14, HCO3 of 19. Discussed non-compliance w/ mother today, patient is completely unwilling to regularly use insulin and does not seem to understand the gravity of her illness despite her mother and her family continuously talking to her about this. It was made very clear to the patient that her non-compliance with insulin could become fatal.  -Continue Insulin gtt -When appropriate, transition to Lantus 10 units + ISS-S -Continue D5 1/2NS @ 125 cc/hr for now (s/p ~7L) -Continue BMP q2h -Replete K as necessary -Zofran prn  Poor insight/judgment- Patient w/ multiple admissions for severe DKA, extremely non-compliant w/ insulin as well as doctor visits. Patient does not seem to understand the severity of her illness and the possible consequence of death if this pattern continues. Discussed at length with her mother today, patient does not seem to have the ability to take care of herself with regards to her DM, despite her being a legal adult. On discharge from the hospital, patient is at continuous risk to herself as she is completely unwilling to use insulin, a situation that could one day lead to her death. Her mother also understands this fact.  -Psych consulted for assessment of capacity -Patient may need discharge to skilled nursing if unable to find other resources to make sure patient receives insulin at regular intervals.    Leukocytosis- WBC's on admission 46, now decreased to 25.3. Most likely 2/2 leukemoid reaction in the setting  of DKA, however, patient w/ fever yesterday evening. No signs of infection, denies symptoms of headache, neck pain or stiffness, dysuria, flank pain, nausea, vomiting, diarrhea, cough, sore throat, or vaginal discomfort/discharge.  -Continue Vanc/Cefepime for now -Repeat CBC in AM -GC/Chlamydia probe pending -Remove femoral line as soon as q2h labs are not needed -Blood/urine cultures pending  DVT/PE PPx- Lovenox [place]  Dispo: Disposition is deferred at this time, awaiting improvement of current medical problems.  Anticipated discharge in approximately 2-3 day(s).   The patient does have a current PCP Cresenciano Genre, MD) and does need an [facility] hospital follow-up appointment after discharge.  The patient  does not have transportation limitations that hinder transportation to clinic appointments.  .Services Needed at time of discharge: Y = Yes, Blank = No PT:   OT:   RN:   Equipment:   Other:     LOS: 2 days   Corky Sox, MD 08/18/2013, 7:30 AM

## 2013-08-19 ENCOUNTER — Encounter (HOSPITAL_COMMUNITY): Payer: Self-pay | Admitting: General Practice

## 2013-08-19 DIAGNOSIS — E1110 Type 2 diabetes mellitus with ketoacidosis without coma: Secondary | ICD-10-CM

## 2013-08-19 HISTORY — DX: Type 2 diabetes mellitus with ketoacidosis without coma: E11.10

## 2013-08-19 LAB — BASIC METABOLIC PANEL
BUN: 5 mg/dL — AB (ref 6–23)
BUN: 3 mg/dL — ABNORMAL LOW (ref 6–23)
BUN: 3 mg/dL — AB (ref 6–23)
CALCIUM: 8.3 mg/dL — AB (ref 8.4–10.5)
CHLORIDE: 97 meq/L (ref 96–112)
CO2: 20 mEq/L (ref 19–32)
CO2: 20 mEq/L (ref 19–32)
CO2: 20 meq/L (ref 19–32)
CO2: 21 mEq/L (ref 19–32)
CO2: 21 meq/L (ref 19–32)
CREATININE: 0.44 mg/dL — AB (ref 0.50–1.10)
CREATININE: 0.5 mg/dL (ref 0.50–1.10)
CREATININE: 0.51 mg/dL (ref 0.50–1.10)
Calcium: 8 mg/dL — ABNORMAL LOW (ref 8.4–10.5)
Calcium: 8.2 mg/dL — ABNORMAL LOW (ref 8.4–10.5)
Calcium: 8.2 mg/dL — ABNORMAL LOW (ref 8.4–10.5)
Calcium: 8.5 mg/dL (ref 8.4–10.5)
Chloride: 100 mEq/L (ref 96–112)
Chloride: 102 mEq/L (ref 96–112)
Chloride: 103 mEq/L (ref 96–112)
Chloride: 103 mEq/L (ref 96–112)
Creatinine, Ser: 0.46 mg/dL — ABNORMAL LOW (ref 0.50–1.10)
Creatinine, Ser: 0.46 mg/dL — ABNORMAL LOW (ref 0.50–1.10)
GFR calc Af Amer: >90 mL/min (ref >90)
GFR calc Af Amer: >90 mL/min (ref >90)
GFR calc Af Amer: >90 mL/min (ref >90)
GFR calc non Af Amer: >90 mL/min (ref >90)
GFR calc non Af Amer: >90 mL/min (ref >90)
GFR calc non Af Amer: >90 mL/min (ref >90)
GFR calc non Af Amer: >90 mL/min (ref >90)
GFR calc non Af Amer: >90 mL/min (ref >90)
GLUCOSE: 162 mg/dL — AB (ref 70–99)
Glucose, Bld: 151 mg/dL — ABNORMAL HIGH (ref 70–99)
Glucose, Bld: 213 mg/dL — ABNORMAL HIGH (ref 70–99)
Glucose, Bld: 241 mg/dL — ABNORMAL HIGH (ref 70–99)
Glucose, Bld: 278 mg/dL — ABNORMAL HIGH (ref 70–99)
POTASSIUM: 4.4 meq/L (ref 3.7–5.3)
POTASSIUM: 4.1 meq/L (ref 3.7–5.3)
Potassium: 3.6 mEq/L — ABNORMAL LOW (ref 3.7–5.3)
Potassium: 3.7 mEq/L (ref 3.7–5.3)
Potassium: 4 mEq/L (ref 3.7–5.3)
SODIUM: 135 meq/L — AB (ref 137–147)
Sodium: 133 mEq/L — ABNORMAL LOW (ref 137–147)
Sodium: 135 mEq/L — ABNORMAL LOW (ref 137–147)
Sodium: 136 mEq/L — ABNORMAL LOW (ref 137–147)
Sodium: 136 mEq/L — ABNORMAL LOW (ref 137–147)

## 2013-08-19 LAB — GLUCOSE, CAPILLARY
GLUCOSE-CAPILLARY: 186 mg/dL — AB (ref 70–99)
GLUCOSE-CAPILLARY: 256 mg/dL — AB (ref 70–99)
Glucose-Capillary: 143 mg/dL — ABNORMAL HIGH (ref 70–99)
Glucose-Capillary: 151 mg/dL — ABNORMAL HIGH (ref 70–99)
Glucose-Capillary: 153 mg/dL — ABNORMAL HIGH (ref 70–99)
Glucose-Capillary: 177 mg/dL — ABNORMAL HIGH (ref 70–99)
Glucose-Capillary: 187 mg/dL — ABNORMAL HIGH (ref 70–99)
Glucose-Capillary: 207 mg/dL — ABNORMAL HIGH (ref 70–99)
Glucose-Capillary: 236 mg/dL — ABNORMAL HIGH (ref 70–99)
Glucose-Capillary: 313 mg/dL — ABNORMAL HIGH (ref 70–99)
Glucose-Capillary: 359 mg/dL — ABNORMAL HIGH (ref 70–99)

## 2013-08-19 LAB — CBC
HCT: 25.1 % — ABNORMAL LOW (ref 36.0–46.0)
HEMOGLOBIN: 8.6 g/dL — AB (ref 12.0–15.0)
MCH: 27.9 pg (ref 26.0–34.0)
MCHC: 34.3 g/dL (ref 30.0–36.0)
MCV: 81.5 fL (ref 78.0–100.0)
PLATELETS: 292 10*3/uL (ref 150–400)
RBC: 3.08 MIL/uL — AB (ref 3.87–5.11)
RDW: 13.4 % (ref 11.5–15.5)
WBC: 19.1 10*3/uL — ABNORMAL HIGH (ref 4.0–10.5)

## 2013-08-19 LAB — PHOSPHORUS
Phosphorus: 1.2 mg/dL — ABNORMAL LOW (ref 2.3–4.6)
Phosphorus: 1.3 mg/dL — ABNORMAL LOW (ref 2.3–4.6)

## 2013-08-19 LAB — MAGNESIUM
Magnesium: 1.8 mg/dL (ref 1.5–2.5)
Magnesium: 1.8 mg/dL (ref 1.5–2.5)

## 2013-08-19 LAB — VANCOMYCIN, TROUGH: Vancomycin Tr: 12.5 ug/mL (ref 10.0–20.0)

## 2013-08-19 MED ORDER — INSULIN ASPART 100 UNIT/ML ~~LOC~~ SOLN
5.0000 [IU] | Freq: Once | SUBCUTANEOUS | Status: AC
  Administered 2013-08-19: 5 [IU] via SUBCUTANEOUS

## 2013-08-19 MED ORDER — INSULIN GLARGINE 100 UNIT/ML ~~LOC~~ SOLN
20.0000 [IU] | Freq: Once | SUBCUTANEOUS | Status: AC
  Administered 2013-08-19: 20 [IU] via SUBCUTANEOUS
  Filled 2013-08-19: qty 0.2

## 2013-08-19 MED ORDER — SODIUM CHLORIDE 0.9 % IV SOLN
INTRAVENOUS | Status: AC
  Administered 2013-08-19: 18:00:00 via INTRAVENOUS

## 2013-08-19 MED ORDER — ACETAMINOPHEN 325 MG PO TABS
650.0000 mg | ORAL_TABLET | ORAL | Status: DC | PRN
  Administered 2013-08-20 (×2): 650 mg via ORAL
  Filled 2013-08-19 (×2): qty 2

## 2013-08-19 MED ORDER — POTASSIUM CHLORIDE CRYS ER 20 MEQ PO TBCR
40.0000 meq | EXTENDED_RELEASE_TABLET | Freq: Once | ORAL | Status: AC
  Administered 2013-08-19: 40 meq via ORAL
  Filled 2013-08-19: qty 2

## 2013-08-19 MED ORDER — INSULIN ASPART 100 UNIT/ML ~~LOC~~ SOLN
0.0000 [IU] | Freq: Three times a day (TID) | SUBCUTANEOUS | Status: DC
  Administered 2013-08-19: 15 [IU] via SUBCUTANEOUS
  Administered 2013-08-19: 11 [IU] via SUBCUTANEOUS
  Administered 2013-08-20 (×3): 5 [IU] via SUBCUTANEOUS

## 2013-08-19 MED ORDER — INSULIN GLARGINE 100 UNIT/ML ~~LOC~~ SOLN: 20.0000 [IU] | Freq: Every day | SUBCUTANEOUS | Status: DC

## 2013-08-19 MED ORDER — IBUPROFEN 600 MG PO TABS
600.0000 mg | ORAL_TABLET | Freq: Three times a day (TID) | ORAL | Status: DC | PRN
  Administered 2013-08-19: 600 mg via ORAL
  Filled 2013-08-19: qty 1

## 2013-08-19 MED ORDER — SODIUM CHLORIDE 0.9 % IV SOLN: INTRAVENOUS | Status: DC

## 2013-08-19 MED ORDER — INSULIN GLARGINE 100 UNIT/ML ~~LOC~~ SOLN
20.0000 [IU] | Freq: Every day | SUBCUTANEOUS | Status: DC
  Filled 2013-08-19: qty 0.2

## 2013-08-19 MED ORDER — INSULIN GLARGINE 100 UNIT/ML ~~LOC~~ SOLN
10.0000 [IU] | Freq: Every day | SUBCUTANEOUS | Status: DC
  Administered 2013-08-19: 10 [IU] via SUBCUTANEOUS
  Filled 2013-08-19: qty 0.1

## 2013-08-19 MED ORDER — INSULIN ASPART 100 UNIT/ML ~~LOC~~ SOLN: 0.0000 [IU] | Freq: Every day | SUBCUTANEOUS | Status: DC

## 2013-08-19 MED ORDER — VANCOMYCIN HCL IN DEXTROSE 750-5 MG/150ML-% IV SOLN
750.0000 mg | Freq: Three times a day (TID) | INTRAVENOUS | Status: DC
  Administered 2013-08-19 – 2013-08-20 (×4): 750 mg via INTRAVENOUS
  Filled 2013-08-19 (×7): qty 150

## 2013-08-19 NOTE — Progress Notes (Signed)
Inpatient Diabetes Program Recommendations  AACE/ADA: New Consensus Statement on Inpatient Glycemic Control (2013)  Target Ranges:  Prepandial:   less than 140 mg/dL      Peak postprandial:   less than 180 mg/dL (1-2 hours)      Critically ill patients:  140 - 180 mg/dL   Reason for Visit:  Patient transitioned off insulin drip this AM.  Note events from yesterday. Talked to patient today.  She seems more alert and reasonable.  Asked her if she has any needs regarding her diabetes.  I explained that her medicaid should cover the Novolog and Lantus.  She needs lots of support.  Discussed with case Freight forwarder.  She states she will talk to mother and also has asked social worker to speak with patient.   Will follow.   Thanks,  Adah Perl, RN, BC-ADM Inpatient Diabetes Coordinator Pager [phone]

## 2013-08-19 NOTE — Progress Notes (Signed)
ANTIBIOTIC CONSULT NOTE - INITIAL  Pharmacy Consult for vancomycin Indication: empiric for leukocytosis/gluteal abscess  Allergies  Allergen Reactions  . Penicillins Hives    Patient Measurements: Height: 5\' 5"  (165.1 cm) Weight: 120 lb (54.432 kg) IBW/kg (Calculated) : 57   Vital Signs: Temp: 100.4 F (38 C) (04/22 0807) Temp src: Oral (04/22 0807) BP: 114/75 mmHg (04/21 2300) Intake/Output from previous day: 04/21 0701 - 04/22 0700 In: 1127.6 [I.V.:627.6; IV Piggyback:500] Out: 3350 [Urine:3350] Intake/Output from this shift:    Labs:  Recent Labs  08/17/13 1752  08/18/13 0321  08/19/13 0252 08/19/13 0355 08/19/13 0835  WBC 26.3*  --  25.3*  --   --  19.1*  --   HGB 9.0*  --  9.1*  --   --  8.6*  --   PLT 327  --  326  --   --  292  --   CREATININE 0.46*  < > 0.37*  < > 0.50 0.51 0.46*  < > = values in this interval not displayed. Estimated Creatinine Clearance: 95.5 ml/min (by C-G formula based on Cr of 0.46).  Recent Labs  08/19/13 0800  VANCOTROUGH 12.5     Microbiology: Recent Results (from the past 720 hour(s))  URINE CULTURE     Status: None   Collection Time    08/07/13  3:18 PM      Result Value Ref Range Status   Specimen Description URINE, CLEAN CATCH   Final   Special Requests NONE   Final   Culture  Setup Time     Final   Value: 08/07/2013 16:30     Performed at [facility]     Final   Value: 30,000 COLONIES/ML     Performed at [facility]   Culture     Final   Value: YEAST     Performed at [facility]   Report Status 08/08/2013 FINAL   Final  MRSA PCR SCREENING     Status: None   Collection Time    08/07/13  6:11 PM      Result Value Ref Range Status   MRSA by PCR NEGATIVE  NEGATIVE Final   Comment:            The GeneXpert MRSA Assay (FDA     approved for NASAL specimens     only), is one component of a     comprehensive MRSA colonization     surveillance program. It is not   intended to diagnose MRSA     infection nor to guide or     monitor treatment for     MRSA infections.  CULTURE, BLOOD (ROUTINE X 2)     Status: None   Collection Time    08/07/13  7:59 PM      Result Value Ref Range Status   Specimen Description BLOOD LEFT ARM   Final   Special Requests BOTTLES DRAWN AEROBIC ONLY 10CC   Final   Culture  Setup Time     Final   Value: 08/08/2013 01:01     Performed at [facility]   Culture     Final   Value: NO GROWTH 5 DAYS     Performed at [facility]   Report Status 08/14/2013 FINAL   Final  CULTURE, BLOOD (ROUTINE X 2)     Status: None   Collection Time    08/07/13  8:32 PM  Result Value Ref Range Status   Specimen Description BLOOD RIGHT HAND   Final   Special Requests BOTTLES DRAWN AEROBIC ONLY 2CC   Final   Culture  Setup Time     Final   Value: 08/08/2013 01:01     Performed at [facility]   Culture     Final   Value: NO GROWTH 5 DAYS     Performed at [facility]   Report Status 08/14/2013 FINAL   Final  URINE CULTURE     Status: None   Collection Time    08/16/13 11:39 PM      Result Value Ref Range Status   Specimen Description URINE, CATHETERIZED   Final   Special Requests NONE   Final   Culture  Setup Time     Final   Value: 08/17/2013 10:14     Performed at [facility]     Final   Value: NO GROWTH     Performed at [facility]   Culture     Final   Value: NO GROWTH     Performed at [facility]   Report Status 08/18/2013 FINAL   Final  CULTURE, BLOOD (ROUTINE X 2)     Status: None   Collection Time    08/17/13  1:00 AM      Result Value Ref Range Status   Specimen Description BLOOD LEFT FEMORAL VEIN   Final   Special Requests BOTTLES DRAWN AEROBIC ONLY 6CC   Final   Culture  Setup Time     Final   Value: 08/17/2013 10:09     Performed at [facility]   Culture     Final   Value:        BLOOD CULTURE RECEIVED NO GROWTH TO DATE  CULTURE WILL BE HELD FOR 5 DAYS BEFORE ISSUING A FINAL NEGATIVE REPORT     Performed at [facility]   Report Status PENDING   Incomplete  CULTURE, BLOOD (ROUTINE X 2)     Status: None   Collection Time    08/17/13  1:00 AM      Result Value Ref Range Status   Specimen Description BLOOD LEFT FEMORAL ARTERY   Final   Special Requests BOTTLES DRAWN AEROBIC ONLY 5CC   Final   Culture  Setup Time     Final   Value: 08/17/2013 10:09     Performed at [facility]   Culture     Final   Value:        BLOOD CULTURE RECEIVED NO GROWTH TO DATE CULTURE WILL BE HELD FOR 5 DAYS BEFORE ISSUING A FINAL NEGATIVE REPORT     Performed at [facility]   Report Status PENDING   Incomplete  CULTURE, BLOOD (ROUTINE X 2)     Status: None   Collection Time    08/17/13  8:52 AM      Result Value Ref Range Status   Specimen Description BLOOD LEFT ANTECUBITAL   Final   Special Requests BOTTLES DRAWN AEROBIC ONLY [facility]   Final   Culture  Setup Time     Final   Value: 08/17/2013 13:30     Performed at [facility]   Culture     Final   Value:        BLOOD CULTURE RECEIVED NO GROWTH TO DATE CULTURE WILL BE HELD FOR 5 DAYS BEFORE ISSUING A FINAL NEGATIVE  REPORT     Performed at [facility]   Report Status PENDING   Incomplete  CULTURE, BLOOD (ROUTINE X 2)     Status: None   Collection Time    08/17/13  9:00 AM      Result Value Ref Range Status   Specimen Description BLOOD LEFT HAND   Final   Special Requests BOTTLES DRAWN AEROBIC ONLY [facility]   Final   Culture  Setup Time     Final   Value: 08/17/2013 13:31     Performed at [facility]   Culture     Final   Value:        BLOOD CULTURE RECEIVED NO GROWTH TO DATE CULTURE WILL BE HELD FOR 5 DAYS BEFORE ISSUING A FINAL NEGATIVE REPORT     Performed at [facility]   Report Status PENDING   Incomplete  GC/CHLAMYDIA PROBE AMP     Status: None   Collection Time    08/17/13  4:05 PM      Result Value Ref  Range Status   CT Probe RNA NEGATIVE  NEGATIVE Final   GC Probe RNA NEGATIVE  NEGATIVE Final   Comment: (NOTE)                                                                                               **Normal Reference Range: Negative**          Assay performed using the Gen-Probe APTIMA COMBO2 (R) Assay.     Acceptable specimen types for this assay include APTIMA Swabs (Unisex,     endocervical, urethral, or vaginal), first void urine, and ThinPrep     liquid based cytology samples.     Performed at [facility]     Status: None   Collection Time    08/18/13  7:35 PM      Result Value Ref Range Status   Specimen Description ABSCESS BUTTOCKS   Final   Special Requests     Final   Value: PATIENT ON FOLLOWING VANCOMYCIN RIGHT GLUTEAL ABSCESS   Gram Stain     Final   Value: MODERATE WBC PRESENT, PREDOMINANTLY PMN     NO SQUAMOUS EPITHELIAL CELLS SEEN     ABUNDANT GRAM POSITIVE COCCI IN PAIRS     ABUNDANT GRAM POSITIVE RODS     ABUNDANT GRAM NEGATIVE RODS   Culture     Final   Value: NO ANAEROBES ISOLATED; CULTURE IN PROGRESS FOR 5 DAYS     Performed at [facility]   Report Status PENDING   Incomplete  CULTURE, ROUTINE-ABSCESS     Status: None   Collection Time    08/18/13  7:35 PM      Result Value Ref Range Status   Specimen Description ABSCESS BUTTOCKS   Final   Special Requests     Final   Value: PATIENT ON FOLLOWING VANCOMYCIN RIGHT GLUTEAL ABSCESS   Gram Stain     Final   Value: MODERATE WBC PRESENT, PREDOMINANTLY PMN     NO SQUAMOUS EPITHELIAL CELLS SEEN  ABUNDANT GRAM POSITIVE COCCI IN PAIRS     ABUNDANT GRAM POSITIVE RODS     ABUNDANT GRAM NEGATIVE RODS   Culture     Final   Value: NO GROWTH     Performed at [facility]   Report Status PENDING   Incomplete    Medical History: Past Medical History  Diagnosis Date  . Gonorrhea 08/2011    Treated in 09/2011  . Diabetes mellitus 2001    Diagnosed at age 78 ; Type  I    Medications:  Anti-infectives   Start     Dose/Rate Route Frequency Ordered Stop   08/19/13 0930  vancomycin (VANCOCIN) IVPB 750 mg/150 ml premix     750 mg 150 mL/hr over 60 Minutes Intravenous Every 8 hours 08/19/13 0908     08/18/13 2000  ciprofloxacin (CIPRO) IVPB 400 mg     400 mg 200 mL/hr over 60 Minutes Intravenous Every 12 hours 08/18/13 1832     08/18/13 2000  metroNIDAZOLE (FLAGYL) IVPB 500 mg     500 mg 100 mL/hr over 60 Minutes Intravenous Every 8 hours 08/18/13 1832     08/17/13 1000  ceFEPIme (MAXIPIME) 1 g in dextrose 5 % 50 mL IVPB  Status:  Discontinued     1 g 100 mL/hr over 30 Minutes Intravenous Every 8 hours 08/17/13 0925 08/18/13 1832   08/17/13 0900  vancomycin (VANCOCIN) 500 mg in sodium chloride 0.9 % 100 mL IVPB  Status:  Discontinued     500 mg 100 mL/hr over 60 Minutes Intravenous Every 8 hours 08/17/13 0803 08/19/13 0908     Assessment: 15 yof presented to the hospital with DKA. Pt with impressive leukocytosis, 47 but afebrile on admit. To start empiric IV abx (PCN allergy but has tolerated keflex in the past). On 4/21, noted gluteal abscess which was I&D. To continue IV abx. Tmax 100, WBC trending down to 19.1, SCr stable. VT today SUBtherapeutic at 12.5, will increase dose today.  Cefepime 4/20>>4/21 Vanc 4/20>> Cipro 4/21>> Flagyl 4/21>>  4/21 gluteal abscess>> 4/20 BCx2>>NGTD 4/20 GC/Chlamydia>>NEG 4/19 UCx>>NEG   Goal of Therapy:  Vancomycin trough level 15-20 mcg/ml  Plan:  1. Increase vancomycin to 750mg  IV Q8H 2. Continue Flagyl and ciprofloxacin per MD 3. F/u renal fxn, C&S, clinical status and trough at [place], PharmD Clinical Pharmacist - Resident Pager: [phone] Pharmacy: [phone] 08/19/2013 10:42 AM

## 2013-08-19 NOTE — Progress Notes (Signed)
1 Day Post-Op  Subjective: Complains of incisional pain  Objective: Vital signs in last 24 hours: Temp:  [98.4 F (36.9 C)-100.4 F (38 C)] 100.4 F (38 C) (04/22 0807) Pulse Rate:  [96-127] 121 (04/21 2100) Resp:  [15-36] 22 (04/21 2300) BP: (92-135)/(64-95) 114/75 mmHg (04/21 2300) SpO2:  [98 %-100 %] 98 % (04/21 2100)    Intake/Output from previous day: 04/21 0701 - 04/22 0700 In: 1127.6 [I.V.:627.6; IV Piggyback:500] Out: 3350 [Urine:3350] Intake/Output this shift:   Packing removed from right buttock abscess.  Wound clean.  repacked  Lab Results:   Recent Labs  08/18/13 0321 08/19/13 0355  WBC 25.3* 19.1*  HGB 9.1* 8.6*  HCT 26.0* 25.1*  PLT 326 292   BMET  Recent Labs  08/19/13 0252 08/19/13 0355  NA 136* 136*  K 3.6* 4.0  CL 103 103  CO2 21 20  GLUCOSE 151* 162*  BUN <3* <3*  CREATININE 0.50 0.51  CALCIUM 8.0* 8.3*   PT/INR No results found for this basename: LABPROT, INR,  in the last 72 hours ABG  Recent Labs  08/16/13 2245  PHART 7.162*  HCO3 2.5*    Studies/Results: No results found.  Anti-infectives: Anti-infectives   Start     Dose/Rate Route Frequency Ordered Stop   08/18/13 2000  ciprofloxacin (CIPRO) IVPB 400 mg     400 mg 200 mL/hr over 60 Minutes Intravenous Every 12 hours 08/18/13 1832     08/18/13 2000  metroNIDAZOLE (FLAGYL) IVPB 500 mg     500 mg 100 mL/hr over 60 Minutes Intravenous Every 8 hours 08/18/13 1832     08/17/13 1000  ceFEPIme (MAXIPIME) 1 g in dextrose 5 % 50 mL IVPB  Status:  Discontinued     1 g 100 mL/hr over 30 Minutes Intravenous Every 8 hours 08/17/13 0925 08/18/13 1832   08/17/13 0900  vancomycin (VANCOCIN) 500 mg in sodium chloride 0.9 % 100 mL IVPB     500 mg 100 mL/hr over 60 Minutes Intravenous Every 8 hours 08/17/13 0803        Assessment/Plan: s/p Procedure(s): IRRIGATION AND DEBRIDEMENT GLUTEAL ABSCESS (Right)  Continue wound care/packing and antibiotics  LOS: 3 days    Harl Bowie 08/19/2013

## 2013-08-19 NOTE — Progress Notes (Signed)
Subjective: Patient seen at bedside this AM. Appears much improved clinically. No longer tearful, energy level increased since yesterday.  Yesterday afternoon, RN discovered redness and small ulcer on right gluteal fold. On exam, significant erythema, tenderness to palpation and significant area of induration and fluctuance. Seen by Dr. Rosendo Gros, performed I&D in PM of 08/18/13, drained large amount of purulent fluid from area.    Says she is feeling significantly better s/p gluteal abscess drainage. Leukocytosis continues to decline, still with mild fever of 100.4 this AM.   Transitioned from Insulin gtt to Lantus 20 units this AM.   Objective: Vital signs in last 24 hours: Filed Vitals:   08/18/13 2300 08/19/13 0023 08/19/13 0439 08/19/13 0807  BP: 114/75     Pulse:      Temp:  99.5 F (37.5 C) 100 F (37.8 C) 100.4 F (38 C)  TempSrc:  Oral Oral Oral  Resp: 22     Height:      Weight:      SpO2:       Weight change:   Intake/Output Summary (Last 24 hours) at 08/19/13 0943 Last data filed at 08/19/13 0427  Gross per 24 hour  Intake  727.6 ml  Output   3350 ml  Net -2622.4 ml   Physical Exam: General: Alert, cooperative, no acute distress.  HEENT: PERRL, EOMI. Moist mucus membranes Neck: Full range of motion without pain, supple, no lymphadenopathy or carotid bruits Lungs: Clear to ascultation bilaterally, normal work of respiration, no wheezes, rales, rhonchi Heart: Tachycardic, regular rhythm, no murmurs, gallops, or rubs Abdomen: Soft, non-tender, non-distended, BS +.  Extremities: No cyanosis, clubbing, or edema. Right gluteal abscess appears clean, dry, and intact, no drainage. Packed w/ gauze, covered w/ abdominal pad and mesh undergarments. Less tender to palpation today. Neurologic: Alert & oriented X3, cranial nerves II-XII intact, strength grossly intact, sensation intact to light touch  Lab Results: Basic Metabolic Panel:  Recent Labs Lab 08/18/13 1320   08/19/13 0355 08/19/13 0835  NA 133*  < > 136* 135*  K 3.3*  < > 4.0 4.4  CL 102  < > 103 102  CO2 19  < > 20 20  GLUCOSE 151*  < > 162* 213*  BUN 3*  < > <3* 3*  CREATININE 0.37*  < > 0.51 0.46*  CALCIUM 8.6  < > 8.3* 8.2*  MG 2.1  --  1.8  --   PHOS 0.6*  --  1.2*  --   < > = values in this interval not displayed.  CBC:  Recent Labs Lab 08/17/13 0748 08/17/13 1752 08/18/13 0321 08/19/13 0355  WBC 32.1* 26.3* 25.3* 19.1*  NEUTROABS 25.7* 20.5*  --   --   HGB 9.3* 9.0* 9.1* 8.6*  HCT 27.6* 26.1* 26.0* 25.1*  MCV 81.7 80.3 79.5 81.5  PLT 331 327 326 292    Cardiac Enzymes:  Recent Labs Lab 08/16/13 2235  TROPONINI <0.30   CBG:  Recent Labs Lab 08/18/13 2348 08/19/13 0051 08/19/13 0200 08/19/13 0259 08/19/13 0413 08/19/13 0739  GLUCAP 236* 207* 151* 143* 177* 186*   Urinalysis:  Recent Labs Lab 08/16/13 2339 08/17/13 1605  COLORURINE YELLOW YELLOW  LABSPEC 1.029 1.025  PHURINE 5.0 6.0  GLUCOSEU >1000* >1000*  HGBUR NEGATIVE TRACE*  BILIRUBINUR NEGATIVE NEGATIVE  KETONESUR >80* >80*  PROTEINUR 30* 100*  UROBILINOGEN 0.2 0.2  NITRITE NEGATIVE NEGATIVE  LEUKOCYTESUR NEGATIVE SMALL*    Micro Results: Recent Results (from the past 240  hour(s))  URINE CULTURE     Status: None   Collection Time    08/16/13 11:39 PM      Result Value Ref Range Status   Specimen Description URINE, CATHETERIZED   Final   Special Requests NONE   Final   Culture  Setup Time     Final   Value: 08/17/2013 10:14     Performed at [facility] Count     Final   Value: NO GROWTH     Performed at [facility]   Culture     Final   Value: NO GROWTH     Performed at [facility]   Report Status 08/18/2013 FINAL   Final  CULTURE, BLOOD (ROUTINE X 2)     Status: None   Collection Time    08/17/13  1:00 AM      Result Value Ref Range Status   Specimen Description BLOOD LEFT FEMORAL VEIN   Final   Special Requests BOTTLES DRAWN AEROBIC  ONLY 6CC   Final   Culture  Setup Time     Final   Value: 08/17/2013 10:09     Performed at [facility]   Culture     Final   Value:        BLOOD CULTURE RECEIVED NO GROWTH TO DATE CULTURE WILL BE HELD FOR 5 DAYS BEFORE ISSUING A FINAL NEGATIVE REPORT     Performed at [facility]   Report Status PENDING   Incomplete  CULTURE, BLOOD (ROUTINE X 2)     Status: None   Collection Time    08/17/13  1:00 AM      Result Value Ref Range Status   Specimen Description BLOOD LEFT FEMORAL ARTERY   Final   Special Requests BOTTLES DRAWN AEROBIC ONLY 5CC   Final   Culture  Setup Time     Final   Value: 08/17/2013 10:09     Performed at [facility]   Culture     Final   Value:        BLOOD CULTURE RECEIVED NO GROWTH TO DATE CULTURE WILL BE HELD FOR 5 DAYS BEFORE ISSUING A FINAL NEGATIVE REPORT     Performed at [facility]   Report Status PENDING   Incomplete  CULTURE, BLOOD (ROUTINE X 2)     Status: None   Collection Time    08/17/13  8:52 AM      Result Value Ref Range Status   Specimen Description BLOOD LEFT ANTECUBITAL   Final   Special Requests BOTTLES DRAWN AEROBIC ONLY [facility]   Final   Culture  Setup Time     Final   Value: 08/17/2013 13:30     Performed at [facility]   Culture     Final   Value:        BLOOD CULTURE RECEIVED NO GROWTH TO DATE CULTURE WILL BE HELD FOR 5 DAYS BEFORE ISSUING A FINAL NEGATIVE REPORT     Performed at [facility]   Report Status PENDING   Incomplete  CULTURE, BLOOD (ROUTINE X 2)     Status: None   Collection Time    08/17/13  9:00 AM      Result Value Ref Range Status   Specimen Description BLOOD LEFT HAND   Final   Special Requests BOTTLES DRAWN AEROBIC ONLY [facility]   Final   Culture  Setup Time     Final  Value: 08/17/2013 13:31     Performed at [facility]   Culture     Final   Value:        BLOOD CULTURE RECEIVED NO GROWTH TO DATE CULTURE WILL BE HELD FOR 5 DAYS BEFORE ISSUING A FINAL NEGATIVE  REPORT     Performed at [facility]   Report Status PENDING   Incomplete  GC/CHLAMYDIA PROBE AMP     Status: None   Collection Time    08/17/13  4:05 PM      Result Value Ref Range Status   CT Probe RNA NEGATIVE  NEGATIVE Final   GC Probe RNA NEGATIVE  NEGATIVE Final   Comment: (NOTE)                                                                                               **Normal Reference Range: Negative**          Assay performed using the Gen-Probe APTIMA COMBO2 (R) Assay.     Acceptable specimen types for this assay include APTIMA Swabs (Unisex,     endocervical, urethral, or vaginal), first void urine, and ThinPrep     liquid based cytology samples.     Performed at [facility]     Status: None   Collection Time    08/18/13  7:35 PM      Result Value Ref Range Status   Specimen Description ABSCESS BUTTOCKS   Final   Special Requests     Final   Value: PATIENT ON FOLLOWING VANCOMYCIN RIGHT GLUTEAL ABSCESS   Gram Stain     Final   Value: MODERATE WBC PRESENT, PREDOMINANTLY PMN     NO SQUAMOUS EPITHELIAL CELLS SEEN     ABUNDANT GRAM POSITIVE COCCI IN PAIRS     ABUNDANT GRAM POSITIVE RODS     ABUNDANT GRAM NEGATIVE RODS   Culture     Final   Value: NO ANAEROBES ISOLATED; CULTURE IN PROGRESS FOR 5 DAYS     Performed at [facility]   Report Status PENDING   Incomplete  CULTURE, ROUTINE-ABSCESS     Status: None   Collection Time    08/18/13  7:35 PM      Result Value Ref Range Status   Specimen Description ABSCESS BUTTOCKS   Final   Special Requests     Final   Value: PATIENT ON FOLLOWING VANCOMYCIN RIGHT GLUTEAL ABSCESS   Gram Stain     Final   Value: MODERATE WBC PRESENT, PREDOMINANTLY PMN     NO SQUAMOUS EPITHELIAL CELLS SEEN     ABUNDANT GRAM POSITIVE COCCI IN PAIRS     ABUNDANT GRAM POSITIVE RODS     ABUNDANT GRAM NEGATIVE RODS   Culture     Final   Value: NO GROWTH     Performed at [facility]   Report  Status PENDING   Incomplete    Medications: I have reviewed the patient's current medications. Scheduled Meds: . ciprofloxacin  400 mg Intravenous Q12H  . enoxaparin (LOVENOX) injection  40 mg Subcutaneous Q24H  .  insulin aspart  0-15 Units Subcutaneous TID WC  . insulin aspart  0-5 Units Subcutaneous QHS  . metronidazole  500 mg Intravenous Q8H  . vancomycin  750 mg Intravenous Q8H   Continuous Infusions: . sodium chloride     PRN Meds:.acetaminophen, dextrose, HYDROmorphone (DILAUDID) injection  Assessment/Plan: [name] is a 23 y.o. female w/ PMHx of Type I DM w/ multiple admissions for severe DKA, admitted for DKA.  Diabetic Ketoacidosis- Severely uncontrolled Type I DM, most recent HbA1c 19.3%. Admission, ABG showed pH 7.16, pCO2 7.3, pO2 131, HCO3 2.5. Most recent AG 13, transitioned to Lantus 20 units this AM. K stable at 4.4. Mildly increasing CBG's, will continue to monitor.  -Continue IVF's; NS @ 125 cc/hr -Continue Lantus 20 units daily  -ISS-M + CBG's AC/HS -Repeat BMP + Mag + Phos in PM -Replete K as necessary -Zofran prn -Transfer to med-surg  Right Gluteal Abscess- Patient w/ newly discovered gluteal abscess. Previously, patient refused full skin and GU exam and denied ulcers or sores when asked on several occasions. RN helped patient to bathroom on afternoon of 08/18/13, noticed area of tenderness and erythema on the right gluteal fold. Patient claimed it had been present for 3-4 days, mother says it had been present for 2 weeks. Significant tenderness, erythema, induration and fluctuance on exam (8 x 10 cm). Seen by Dr. Rosendo Gros yesterday (08/19/13), performed I&D in PM of 08/18/13, drained large amount of purulent fluid from abscess. Patient w/ significant improvement in symptoms. On Vancomycin + Cipro + Flagyl (started by surgery). Leukocytosis improving, still with mild fever this AM. Wound gram stain w/ gram positive cocci, rods, and gram negative rods.    -Discussed w/ ID today, can cover w/ Augmentin alone, s/p I&D. Will continue Vancomycin/Cipro/Flagyl until tomorrow, then switch to Augmentin 875-125 mf po bid.  -Continue to monitor -Repeat CBC in AM -Wound culture pending.  Poor insight/judgment- Patient w/ multiple admissions for severe DKA, extremely non-compliant w/ insulin as well as doctor visits. Patient does not seem to understand the severity of her illness and the possible consequence of death if this pattern continues. Psych consulted for assessment of capacity on 08/18/13, patient does in fact have capacity at this time. -Patient will likely need discharge to skilled nursing,especially given new gluteal abscess requiring close wound care.   DVT/PE PPx- Lovenox [place]  Dispo: Disposition is deferred at this time, awaiting improvement of current medical problems.  Anticipated discharge in approximately 2-3 day(s).   The patient does have a current PCP Cresenciano Genre, MD) and does need an [facility] hospital follow-up appointment after discharge.  The patient does not have transportation limitations that hinder transportation to clinic appointments.  .Services Needed at time of discharge: Y = Yes, Blank = No PT:   OT:   RN:   Equipment:   Other:     LOS: 3 days   Corky Sox, MD 08/19/2013, 9:43 AM

## 2013-08-19 NOTE — Progress Notes (Signed)
  Date: 08/19/2013  Patient name: Ashley Freeman  Medical record number: [id number]  Date of birth: Feb 28, 1991   This patient has been seen and the plan of care was discussed with the house staff. Please see their note for complete details. I concur with their findings with the following additions/corrections: Appreciate GS involvement with drainage of gluteal abscess. Her AG is closed. BG better controlled. Agree with [facility] insulin regimen per resident note. Her WBC is decreasing. For now, given her initial acuity and size of abscess, would continue current IV abx tx until she is afebrile for at least 24 hours. Then, depending on culture data, convert to PO abx (Augmentin would likely be ok).  Dominic Pea, DO, [facility] 08/19/2013, 11:50 AM

## 2013-08-19 NOTE — Progress Notes (Signed)
Patient's dressing to right buttock was removed for changing after becoming soiled with urine. Packing removed. Patient refused to let RN replace packing, and would only allow RN to cover the site. Will continue to monitor.

## 2013-08-19 NOTE — Care Management Note (Signed)
Page 1 of 2   08/21/2013     2:42:43 PM CARE MANAGEMENT NOTE 08/21/2013  Patient:  Ashley Freeman, Ashley Freeman   Account Number:  [id number]  Date Initiated:  08/17/2013  Documentation initiated by:  Ashley Freeman  Subjective/Objective Assessment:   Readmitted with DKA - on insulin drip.     Action/Plan:   patient refuses SNF   Anticipated DC Date:  08/21/2013   Anticipated DC Plan:  [place]  CM consult  [facility]      [facility]  HOME HEALTH   Choice offered to / List presented to:  C-1 Patient        Ashley Freeman arranged  HH-1 RN      [place].   Status of service:  Completed, signed off Medicare Important Message given?   (If response is "NO", the following Medicare IM given date fields will be blank) Date Medicare IM given:   Date Additional Medicare IM given:    Discharge Disposition:  Ashley Freeman  Per UR Regulation:  Reviewed for med. necessity/level of care/duration of stay  If discussed at [place] of Stay Meetings, dates discussed:    Comments:  Contact:  Ashley Freeman,Ashley Freeman [phone]                 Ashley Freeman Relative [phone]  08/21/13 [place], BSN [phone] patient is for dc today, NCM ast patient with levaquin 500mg  4 tabs on [employer] [facility] and the diabetic educator was able to give patient some vouchers for her lantus and novolog insulin.  NCM gave patient reli-on information about meters and strips.  Patient also has an abscess on gluteal area and needs hh services, she chose [facility], referral made to Ashley Freeman for hhrn, Ashley Freeman nontified.  Soc will begin 24-48 hrs post discharge.   08/20/13 [place], BSN [phone] NCM and CSW spoke with patient, patient refused to go to SNF, she states she can go home with [facility] services and her Freeman can help with her wound care, she does not want to go to a SNF for 30 days, which is the only way  medicaid will pay the facility.  NCM informed Resident of this information, he states they will have to look at some other options.  Patient has  Family plan medicaid which does not cover her medications.  NCM spoke with Ashley Freeman with medicine clinic and she states patient has apt on 4/27 and she will need to see [facility] on this day to apply for orange card. Patient has had the Match program 04/2013  and which she does not qualify to receive at this time.  08-19-13 3:50pm Ashley Freeman, RNBSN - [id number] Talked with Mom, Ashley Freeman. Mom starts work at [employer] and usually doesn't get home till 7pm at night.  Wants POA - legal of daughter.  Informed daughter would have to consent - States that will be a TEFL teacher.  This process would have to happen with lawyer - states does not have money - encouraged to call her SW at [employer].  neither daughter or Freeman know name of case worker - states changes all the time. Financial counselor has been working with Ashley Freeman at [employer].  Gave name of worker to mom to contact.  She has number.  Financial counselor also states patient only has Medicaid - family planning which  only covers BCP - Gyn visits, etc.  ?? how is she getting insulin?   Mom also states that she says she has a meter but she has never seen her use it or has she seen one in her belongings.  Informed can get meter at [facility] for 10.00.   Discussed need for dressing changes at home.  Mentioned a SNF if no one able to assist, states daughter wont agree to this would leave. Mom talking that maybe her aunt would be able to care for here or know someone that could assist.  She will make this contact. Discussed with with Ashley Freeman when notified that Mom was here.  08-18-13 [place]  [id number] Pateint very sleepy.  Not opening eyes to answer questions. Mostly just nods head.

## 2013-08-20 ENCOUNTER — Encounter (HOSPITAL_COMMUNITY): Payer: Self-pay | Admitting: General Surgery

## 2013-08-20 DIAGNOSIS — L0231 Cutaneous abscess of buttock: Secondary | ICD-10-CM

## 2013-08-20 DIAGNOSIS — L03317 Cellulitis of buttock: Secondary | ICD-10-CM

## 2013-08-20 LAB — GLUCOSE, CAPILLARY
GLUCOSE-CAPILLARY: 239 mg/dL — AB (ref 70–99)
GLUCOSE-CAPILLARY: 212 mg/dL — AB (ref 70–99)
Glucose-Capillary: 283 mg/dL — ABNORMAL HIGH (ref 70–99)
Glucose-Capillary: 250 mg/dL — ABNORMAL HIGH (ref 70–99)
Glucose-Capillary: 155 mg/dL — ABNORMAL HIGH (ref 70–99)

## 2013-08-20 LAB — CBC WITH DIFFERENTIAL/PLATELET
BASOS ABS: 0.1 10*3/uL (ref 0.0–0.1)
Basophils Relative: 1 % (ref 0–1)
EOS ABS: 0.1 10*3/uL (ref 0.0–0.7)
Eosinophils Relative: 1 % (ref 0–5)
HCT: 25.2 % — ABNORMAL LOW (ref 36.0–46.0)
Hemoglobin: 8.4 g/dL — ABNORMAL LOW (ref 12.0–15.0)
Lymphocytes Relative: 23 % (ref 12–46)
Lymphs Abs: 3.3 10*3/uL (ref 0.7–4.0)
MCH: 27.5 pg (ref 26.0–34.0)
MCHC: 33.3 g/dL (ref 30.0–36.0)
MCV: 82.6 fL (ref 78.0–100.0)
MONO ABS: 1 10*3/uL (ref 0.1–1.0)
Monocytes Relative: 7 % (ref 3–12)
NEUTROS ABS: 9.7 10*3/uL — AB (ref 1.7–7.7)
Neutrophils Relative %: 68 % (ref 43–77)
Platelets: 305 10*3/uL (ref 150–400)
RBC: 3.05 MIL/uL — ABNORMAL LOW (ref 3.87–5.11)
RDW: 13.5 % (ref 11.5–15.5)
Smear Review: ADEQUATE
WBC: 14.2 10*3/uL — ABNORMAL HIGH (ref 4.0–10.5)

## 2013-08-20 LAB — BASIC METABOLIC PANEL
BUN: 5 mg/dL — ABNORMAL LOW (ref 6–23)
BUN: 5 mg/dL — ABNORMAL LOW (ref 6–23)
CALCIUM: 8.3 mg/dL — AB (ref 8.4–10.5)
CHLORIDE: 103 meq/L (ref 96–112)
CO2: 21 mEq/L (ref 19–32)
CO2: 22 meq/L (ref 19–32)
CREATININE: 0.42 mg/dL — AB (ref 0.50–1.10)
Calcium: 8.5 mg/dL (ref 8.4–10.5)
Chloride: 98 mEq/L (ref 96–112)
Creatinine, Ser: 0.4 mg/dL — ABNORMAL LOW (ref 0.50–1.10)
GFR calc Af Amer: >90 mL/min (ref >90)
GFR calc Af Amer: >90 mL/min (ref >90)
GFR calc non Af Amer: >90 mL/min (ref >90)
GLUCOSE: 205 mg/dL — AB (ref 70–99)
Glucose, Bld: 282 mg/dL — ABNORMAL HIGH (ref 70–99)
POTASSIUM: 4.1 meq/L (ref 3.7–5.3)
Potassium: 4 mEq/L (ref 3.7–5.3)
SODIUM: 135 meq/L — AB (ref 137–147)
SODIUM: 136 meq/L — AB (ref 137–147)

## 2013-08-20 LAB — MAGNESIUM: Magnesium: 1.8 mg/dL (ref 1.5–2.5)

## 2013-08-20 LAB — PHOSPHORUS: Phosphorus: 2.7 mg/dL (ref 2.3–4.6)

## 2013-08-20 MED ORDER — MORPHINE SULFATE 2 MG/ML IJ SOLN
INTRAMUSCULAR | Status: AC
  Filled 2013-08-20: qty 1

## 2013-08-20 MED ORDER — INSULIN ASPART 100 UNIT/ML ~~LOC~~ SOLN
0.0000 [IU] | Freq: Three times a day (TID) | SUBCUTANEOUS | Status: DC
  Administered 2013-08-21 (×2): 3 [IU] via SUBCUTANEOUS

## 2013-08-20 MED ORDER — MORPHINE SULFATE 2 MG/ML IJ SOLN
2.0000 mg | Freq: Once | INTRAMUSCULAR | Status: AC
  Administered 2013-08-20: 2 mg via INTRAVENOUS

## 2013-08-20 MED ORDER — INSULIN ASPART 100 UNIT/ML ~~LOC~~ SOLN: 0.0000 [IU] | Freq: Every day | SUBCUTANEOUS | Status: DC

## 2013-08-20 MED ORDER — LEVOFLOXACIN 500 MG PO TABS
500.0000 mg | ORAL_TABLET | Freq: Every day | ORAL | Status: DC
  Administered 2013-08-20 – 2013-08-21 (×2): 500 mg via ORAL
  Filled 2013-08-20 (×2): qty 1

## 2013-08-20 MED ORDER — INSULIN GLARGINE 100 UNIT/ML ~~LOC~~ SOLN
20.0000 [IU] | Freq: Every day | SUBCUTANEOUS | Status: DC
  Administered 2013-08-20 – 2013-08-21 (×2): 20 [IU] via SUBCUTANEOUS
  Filled 2013-08-20 (×2): qty 0.2

## 2013-08-20 MED ORDER — SODIUM CHLORIDE 0.9 % IV SOLN
INTRAVENOUS | Status: AC
  Administered 2013-08-20: 18:00:00 via INTRAVENOUS

## 2013-08-20 MED ORDER — OXYCODONE-ACETAMINOPHEN 5-325 MG PO TABS
1.0000 | ORAL_TABLET | Freq: Two times a day (BID) | ORAL | Status: DC | PRN
  Administered 2013-08-20 – 2013-08-21 (×2): 2 via ORAL
  Filled 2013-08-20 (×2): qty 2

## 2013-08-20 NOTE — Progress Notes (Signed)
2 Days Post-Op  Subjective: Pain with dressing changes, ibuprofen for pain  Objective: Vital signs in last 24 hours: Temp:  [98 F (36.7 C)-99.6 F (37.6 C)] 98 F (36.7 C) (04/23 0600) Pulse Rate:  [95-128] 95 (04/23 0600) Resp:  [17-37] 18 (04/23 0600) BP: (49-119)/(13-86) 104/68 mmHg (04/23 0600) SpO2:  [97 %-100 %] 98 % (04/23 0600) Last BM Date: 08/14/13  Intake/Output from previous day: 04/22 0701 - 04/23 0700 In: 2770 [P.O.:720; I.V.:1000; IV Piggyback:1050] Out: 2150 [Urine:2150] Intake/Output this shift:   PE Incision/Wound: right buttocks, purulent drainage, packing changed.    Lab Results:   Recent Labs  08/19/13 0355 08/20/13 0743  WBC 19.1* 14.2*  HGB 8.6* 8.4*  HCT 25.1* 25.2*  PLT 292 305   BMET  Recent Labs  08/19/13 2231 08/20/13 0743  NA 135* 136*  K 4.0 4.1  CL 98 103  CO2 21 22  GLUCOSE 205* 282*  BUN 5* 5*  CREATININE 0.42* 0.40*  CALCIUM 8.5 8.3*   PT/INR No results found for this basename: LABPROT, INR,  in the last 72 hours ABG No results found for this basename: PHART, PCO2, PO2, HCO3,  in the last 72 hours  Studies/Results: No results found.  Anti-infectives: Anti-infectives   Start     Dose/Rate Route Frequency Ordered Stop   08/19/13 0930  vancomycin (VANCOCIN) IVPB 750 mg/150 ml premix     750 mg 150 mL/hr over 60 Minutes Intravenous Every 8 hours 08/19/13 0908     08/18/13 2000  ciprofloxacin (CIPRO) IVPB 400 mg     400 mg 200 mL/hr over 60 Minutes Intravenous Every 12 hours 08/18/13 1832     08/18/13 2000  metroNIDAZOLE (FLAGYL) IVPB 500 mg     500 mg 100 mL/hr over 60 Minutes Intravenous Every 8 hours 08/18/13 1832     08/17/13 1000  ceFEPIme (MAXIPIME) 1 g in dextrose 5 % 50 mL IVPB  Status:  Discontinued     1 g 100 mL/hr over 30 Minutes Intravenous Every 8 hours 08/17/13 0925 08/18/13 1832   08/17/13 0900  vancomycin (VANCOCIN) 500 mg in sodium chloride 0.9 % 100 mL IVPB  Status:  Discontinued     500  mg 100 mL/hr over 60 Minutes Intravenous Every 8 hours 08/17/13 0803 08/19/13 0908      Assessment/Plan: Gluteal abscess s/p I&D POD#2 -continue with BID wet ot dry dressing changes -needs to be pre-medicated before dressing changes -continue with antibiotics  -will follow   LOS: 4 days    Laiken Nohr ANP-BC 08/20/2013 8:59 AM

## 2013-08-20 NOTE — Progress Notes (Signed)
I have seen and examined the patient and agree with the assessment and plans.  Zaylin Pistilli A. Pollyanna Levay  MD, FACS  

## 2013-08-20 NOTE — Progress Notes (Addendum)
Subjective: Patient seen at bedside this AM. No fever since 8 AM yesterday (100.4 F). Overnight, soaked dressing w/ urine, refused nurse re-dressing appropriately. This AM, wound is not packed, some discharge from wound present. Patient uncooperative this morning, originally refused wound inspection. No other issues overnight. Denies nausea, vomiting, abdominal pain, fever, chills.   Objective: Vital signs in last 24 hours: Filed Vitals:   08/19/13 1500 08/19/13 1608 08/19/13 2123 08/20/13 0600  BP:  93/60 104/70 104/68  Pulse: 115 128 115 95  Temp: 98.7 F (37.1 C) 99.6 F (37.6 C) 99 F (37.2 C) 98 F (36.7 C)  TempSrc: Oral Oral Oral Oral  Resp: 28 18 20 18   Height:      Weight:      SpO2: 100% 98% 98% 98%   Weight change:   Intake/Output Summary (Last 24 hours) at 08/20/13 0719 Last data filed at 08/20/13 0430  Gross per 24 hour  Intake   2770 ml  Output   2150 ml  Net    620 ml   Physical Exam: General: Alert, uncooperative, no acute distress.  HEENT: PERRL, EOMI. Moist mucus membranes Neck: Full range of motion without pain, supple, no lymphadenopathy or carotid bruits Lungs: Clear to ascultation bilaterally, normal work of respiration, no wheezes, rales, rhonchi Heart: RRR, no murmurs, gallops, or rubs Abdomen: Soft, non-tender, non-distended, BS +.  Extremities: No cyanosis, clubbing, or edema. Right gluteal abscess w/dry dressing, unpacked, w/ some mild purulent discharge. Seen after dressing change, appeared clean, dry, and intact.  Neurologic: Alert & oriented X3, cranial nerves II-XII intact, strength grossly intact, sensation intact to light touch  Lab Results: Basic Metabolic Panel:  Recent Labs Lab 08/19/13 0355  08/19/13 1906 08/19/13 2231  NA 136*  < > 135* 135*  K 4.0  < > 4.1 4.0  CL 103  < > 97 98  CO2 20  < > 21 21  GLUCOSE 162*  < > 278* 205*  BUN <3*  < > 5* 5*  CREATININE 0.51  < > 0.46* 0.42*  CALCIUM 8.3*  < > 8.5 8.5  MG 1.8  --   1.8  --   PHOS 1.2*  --  1.3*  --   < > = values in this interval not displayed.  CBC:  Recent Labs Lab 08/17/13 0748 08/17/13 1752 08/18/13 0321 08/19/13 0355  WBC 32.1* 26.3* 25.3* 19.1*  NEUTROABS 25.7* 20.5*  --   --   HGB 9.3* 9.0* 9.1* 8.6*  HCT 27.6* 26.1* 26.0* 25.1*  MCV 81.7 80.3 79.5 81.5  PLT 331 327 326 292    Cardiac Enzymes:  Recent Labs Lab 08/16/13 2235  TROPONINI <0.30   CBG:  Recent Labs Lab 08/19/13 0739 08/19/13 1303 08/19/13 1529 08/19/13 1655 08/19/13 2118 08/20/13 0250  GLUCAP 186* 359* 256* 313* 187* 283*   Urinalysis:  Recent Labs Lab 08/16/13 2339 08/17/13 1605  COLORURINE YELLOW YELLOW  LABSPEC 1.029 1.025  PHURINE 5.0 6.0  GLUCOSEU >1000* >1000*  HGBUR NEGATIVE TRACE*  BILIRUBINUR NEGATIVE NEGATIVE  KETONESUR >80* >80*  PROTEINUR 30* 100*  UROBILINOGEN 0.2 0.2  NITRITE NEGATIVE NEGATIVE  LEUKOCYTESUR NEGATIVE SMALL*    Micro Results: Recent Results (from the past 240 hour(s))  URINE CULTURE     Status: None   Collection Time    08/16/13 11:39 PM      Result Value Ref Range Status   Specimen Description URINE, CATHETERIZED   Final   Special Requests NONE  Final   Culture  Setup Time     Final   Value: 08/17/2013 10:14     Performed at [facility] Count     Final   Value: NO GROWTH     Performed at [facility]   Culture     Final   Value: NO GROWTH     Performed at [facility]   Report Status 08/18/2013 FINAL   Final  CULTURE, BLOOD (ROUTINE X 2)     Status: None   Collection Time    08/17/13  1:00 AM      Result Value Ref Range Status   Specimen Description BLOOD LEFT FEMORAL VEIN   Final   Special Requests BOTTLES DRAWN AEROBIC ONLY 6CC   Final   Culture  Setup Time     Final   Value: 08/17/2013 10:09     Performed at [facility]   Culture     Final   Value:        BLOOD CULTURE RECEIVED NO GROWTH TO DATE CULTURE WILL BE HELD FOR 5 DAYS BEFORE ISSUING A  FINAL NEGATIVE REPORT     Performed at [facility]   Report Status PENDING   Incomplete  CULTURE, BLOOD (ROUTINE X 2)     Status: None   Collection Time    08/17/13  1:00 AM      Result Value Ref Range Status   Specimen Description BLOOD LEFT FEMORAL ARTERY   Final   Special Requests BOTTLES DRAWN AEROBIC ONLY 5CC   Final   Culture  Setup Time     Final   Value: 08/17/2013 10:09     Performed at [facility]   Culture     Final   Value:        BLOOD CULTURE RECEIVED NO GROWTH TO DATE CULTURE WILL BE HELD FOR 5 DAYS BEFORE ISSUING A FINAL NEGATIVE REPORT     Performed at [facility]   Report Status PENDING   Incomplete  CULTURE, BLOOD (ROUTINE X 2)     Status: None   Collection Time    08/17/13  8:52 AM      Result Value Ref Range Status   Specimen Description BLOOD LEFT ANTECUBITAL   Final   Special Requests BOTTLES DRAWN AEROBIC ONLY [facility]   Final   Culture  Setup Time     Final   Value: 08/17/2013 13:30     Performed at [facility]   Culture     Final   Value:        BLOOD CULTURE RECEIVED NO GROWTH TO DATE CULTURE WILL BE HELD FOR 5 DAYS BEFORE ISSUING A FINAL NEGATIVE REPORT     Performed at [facility]   Report Status PENDING   Incomplete  CULTURE, BLOOD (ROUTINE X 2)     Status: None   Collection Time    08/17/13  9:00 AM      Result Value Ref Range Status   Specimen Description BLOOD LEFT HAND   Final   Special Requests BOTTLES DRAWN AEROBIC ONLY [facility]   Final   Culture  Setup Time     Final   Value: 08/17/2013 13:31     Performed at [facility]   Culture     Final   Value:        BLOOD CULTURE RECEIVED NO GROWTH TO DATE CULTURE WILL BE HELD FOR 5 DAYS  BEFORE ISSUING A FINAL NEGATIVE REPORT     Performed at [facility]   Report Status PENDING   Incomplete  GC/CHLAMYDIA PROBE AMP     Status: None   Collection Time    08/17/13  4:05 PM      Result Value Ref Range Status   CT Probe RNA NEGATIVE  NEGATIVE Final    GC Probe RNA NEGATIVE  NEGATIVE Final   Comment: (NOTE)                                                                                               **Normal Reference Range: Negative**          Assay performed using the Gen-Probe APTIMA COMBO2 (R) Assay.     Acceptable specimen types for this assay include APTIMA Swabs (Unisex,     endocervical, urethral, or vaginal), first void urine, and ThinPrep     liquid based cytology samples.     Performed at [place]     Status: None   Collection Time    08/18/13  7:35 PM      Result Value Ref Range Status   Specimen Description ABSCESS BUTTOCKS   Final   Special Requests     Final   Value: PATIENT ON FOLLOWING VANCOMYCIN RIGHT GLUTEAL ABSCESS   Gram Stain     Final   Value: MODERATE WBC PRESENT, PREDOMINANTLY PMN     NO SQUAMOUS EPITHELIAL CELLS SEEN     ABUNDANT GRAM POSITIVE COCCI IN PAIRS     ABUNDANT GRAM POSITIVE RODS     ABUNDANT GRAM NEGATIVE RODS   Culture     Final   Value: NO ANAEROBES ISOLATED; CULTURE IN PROGRESS FOR 5 DAYS     Performed at [facility]   Report Status PENDING   Incomplete  CULTURE, ROUTINE-ABSCESS     Status: None   Collection Time    08/18/13  7:35 PM      Result Value Ref Range Status   Specimen Description ABSCESS BUTTOCKS   Final   Special Requests     Final   Value: PATIENT ON FOLLOWING VANCOMYCIN RIGHT GLUTEAL ABSCESS   Gram Stain     Final   Value: MODERATE WBC PRESENT, PREDOMINANTLY PMN     NO SQUAMOUS EPITHELIAL CELLS SEEN     ABUNDANT GRAM POSITIVE COCCI IN PAIRS     ABUNDANT GRAM POSITIVE RODS     ABUNDANT GRAM NEGATIVE RODS   Culture     Final   Value: NO GROWTH     Performed at [facility]   Report Status PENDING   Incomplete    Medications: I have reviewed the patient's current medications. Scheduled Meds: . ciprofloxacin  400 mg Intravenous Q12H  . enoxaparin (LOVENOX) injection  40 mg Subcutaneous Q24H  . insulin aspart  0-15  Units Subcutaneous TID WC  . insulin glargine  20 Units Subcutaneous Daily  . metronidazole  500 mg Intravenous Q8H  . vancomycin  750 mg Intravenous Q8H   Continuous Infusions: . sodium chloride  PRN Meds:.acetaminophen, dextrose, ibuprofen  Assessment/Plan: [name] is a 23 y.o. female w/ PMHx of Type I DM w/ multiple admissions for severe DKA, admitted for DKA, also found to have right gluteal abscess.  Diabetic Ketoacidosis- Transitioned to Lantus 20 units yesterday. Still w/ elevated CBG's, AG increased to 16 overnight w/ HCO3 of 21. Given extra Lantus 10 units @ ~12 AM. IVF's increased to 200 cc/hr overnight. Denies nausea, vomiting, abdominal pain. This AM, AG of 11, HCO3 22, glucose 282. -Continue IVF's; Decrease NS to 100 cc/hr -Continue Lantus 20 units daily  -Continue ISS-M + CBG's AC/HS. -Repeat BMP + Mag + Phos in AM -Zofran prn  Right Gluteal Abscess- POD #2. Soaked bandage w/ urine overnight, patient refused re-packing by night nursing staff. Wound covered w/ dry dressing, but appeared open, w/ some purulent appearing drainage. Currently on Cipro + Flagyl + Vancomycin. WBC's 14.2 today, decreased from 19.1 yesterday. Discussed w/ ID and microbiology lab, plates growing fecal flora, unable to differentiate at this time. Mostly non-pathogenic gram positive cocci and gram negative rods.  -Change Abx to Levaquin 500 mg po qd for total of 10 days (day 5/10). -Recheck CBC in AM  Poor insight/judgment- Patient w/ extensive history of Insulin non-compliance.  -Discussed SNF placement today, patient agreed.  DVT/PE PPx- Lovenox [place]  Dispo: Disposition is deferred at this time, awaiting improvement of current medical problems. Anticipated discharge in approximately 1-2 day(s). Pending SNF placement.  The patient does have a current PCP Cresenciano Genre, MD) and does need an [facility] hospital follow-up appointment after discharge.  The patient does not have  transportation limitations that hinder transportation to clinic appointments.  .Services Needed at time of discharge: Y = Yes, Blank = No PT:   OT:   RN:   Equipment:   Other:     LOS: 4 days   Corky Sox, MD 08/20/2013, 7:19 AM

## 2013-08-20 NOTE — Progress Notes (Signed)
  Date: 08/20/2013  Patient name: Ashley Freeman  Medical record number: [id number]  Date of birth: 1991-02-18   This patient has been seen and the plan of care was discussed with the house staff. Please see their note for complete details. I concur with their findings with the following additions/corrections: Clinically improved. Appreciate GS help and change of dressing today. She will need bid wet to dry changes.  Agree with stopping IV abx. Start Levaquin 500 mg daily for 6 more days. Will need to follow culture data. Agree with current Lantus dosing. I agree she needs SNF care due to lack of support at home and need for aggressive wound care.   Dominic Pea, DO, [facility] 08/20/2013, 1:36 PM

## 2013-08-20 NOTE — Progress Notes (Signed)
Inpatient Diabetes Program Recommendations  AACE/ADA: New Consensus Statement on Inpatient Glycemic Control (2013)  Target Ranges:  Prepandial:   less than 140 mg/dL      Peak postprandial:   less than 180 mg/dL (1-2 hours)      Critically ill patients:  140 - 180 mg/dL   Reason for Assessment:  Results for REISE, HURLOCK (MRN [id number]) as of 08/20/2013 15:21  Ref. Range 08/19/2013 16:55 08/19/2013 21:18 08/20/2013 02:50 08/20/2013 07:58 08/20/2013 11:50  Glucose-Capillary Latest Range: 70-99 mg/dL 313 (H) 187 (H) 283 (H) 239 (H) 250 (H)    Diabetes history: Type 1 diabetes Outpatient Diabetes medications: Note that patient had Rx. For Lantus/Novolog however she did not get Rx. Filled after last discharge.  She states she was however taking 70/30 15 units bid prior to returning back to hospital 4 days later. Current orders for Inpatient glycemic control: Lantus 20 units daily, Novolog moderate tid with meals.  Consider reducing Novolog to sensitive and adding Novolog meal coverage 3 units tid with meals (to cover CHO intake).  Note plans for patient to discharge to SNF for wound care and insulin administration.  Needs plan also for follow-up counseling regarding diabetes and self-management.  Thanks, Adah Perl, RN, BC-ADM Inpatient Diabetes Coordinator Pager [phone]

## 2013-08-21 LAB — CULTURE, ROUTINE-ABSCESS

## 2013-08-21 LAB — BASIC METABOLIC PANEL
BUN: 3 mg/dL — ABNORMAL LOW (ref 6–23)
CALCIUM: 8.8 mg/dL (ref 8.4–10.5)
CO2: 25 mEq/L (ref 19–32)
Chloride: 101 mEq/L (ref 96–112)
Creatinine, Ser: 0.38 mg/dL — ABNORMAL LOW (ref 0.50–1.10)
GFR calc Af Amer: >90 mL/min (ref >90)
GLUCOSE: 163 mg/dL — AB (ref 70–99)
Potassium: 3.7 mEq/L (ref 3.7–5.3)
SODIUM: 139 meq/L (ref 137–147)

## 2013-08-21 LAB — CBC
HCT: 26 % — ABNORMAL LOW (ref 36.0–46.0)
HEMOGLOBIN: 8.7 g/dL — AB (ref 12.0–15.0)
MCH: 27.6 pg (ref 26.0–34.0)
MCHC: 33.5 g/dL (ref 30.0–36.0)
MCV: 82.5 fL (ref 78.0–100.0)
Platelets: 374 10*3/uL (ref 150–400)
RBC: 3.15 MIL/uL — ABNORMAL LOW (ref 3.87–5.11)
RDW: 13.5 % (ref 11.5–15.5)
WBC: 16 10*3/uL — ABNORMAL HIGH (ref 4.0–10.5)

## 2013-08-21 LAB — GLUCOSE, CAPILLARY
GLUCOSE-CAPILLARY: 151 mg/dL — AB (ref 70–99)
Glucose-Capillary: 173 mg/dL — ABNORMAL HIGH (ref 70–99)

## 2013-08-21 LAB — MAGNESIUM: Magnesium: 1.9 mg/dL (ref 1.5–2.5)

## 2013-08-21 MED ORDER — GLUCOSE BLOOD VI STRP: ORAL_STRIP | Status: DC

## 2013-08-21 MED ORDER — INSULIN LISPRO 100 UNIT/ML (KWIKPEN): 3.0000 [IU] | PEN_INJECTOR | Freq: Three times a day (TID) | SUBCUTANEOUS | Status: DC

## 2013-08-21 MED ORDER — OXYCODONE-ACETAMINOPHEN 5-325 MG PO TABS: 1.0000 | ORAL_TABLET | Freq: Two times a day (BID) | ORAL | Status: DC | PRN

## 2013-08-21 MED ORDER — ACCU-CHEK NANO SMARTVIEW W/DEVICE KIT: 1.0000 | PACK | Freq: Three times a day (TID) | Status: DC

## 2013-08-21 MED ORDER — ACCU-CHEK FASTCLIX LANCETS MISC: 1.0000 | Freq: Three times a day (TID) | Status: DC

## 2013-08-21 MED ORDER — LEVOFLOXACIN 500 MG PO TABS: 500.0000 mg | ORAL_TABLET | Freq: Every day | ORAL | Status: DC

## 2013-08-21 MED ORDER — INSULIN PEN NEEDLE 32G X 4 MM MISC: Status: DC

## 2013-08-21 MED ORDER — INSULIN GLARGINE 100 UNITS/ML SOLOSTAR PEN: 20.0000 [IU] | PEN_INJECTOR | Freq: Every morning | SUBCUTANEOUS | Status: DC

## 2013-08-21 NOTE — Progress Notes (Signed)
  Date: 08/21/2013  Patient name: Ashley Freeman  Medical record number: [id number]  Date of birth: [birth year]   This patient has been seen and the plan of care was discussed with the house staff. Please see their note for complete details. I concur with their findings with the following additions/corrections: Feels well today. Appreciate GS assistance. She will need to continue wound care with BID wet to dry dressing changes. Continue current tx with levaquin to complete 10 days of therapy. Follow cultures.  DKA is resolved. The main issue is making sure she can afford her insulin (need CM assistance) and make sure her mom can help her with dressing changes and insulin administration. If this can be arranged, then she can be D/C home.  Dominic Pea, DO, [facility] 08/21/2013, 11:49 AM

## 2013-08-21 NOTE — Progress Notes (Signed)
3 Days Post-Op  Subjective: Floor has been doing dressing changes dry and not wet  Objective: Vital signs in last 24 hours: Temp:  [98 F (36.7 C)-99 F (37.2 C)] 98 F (36.7 C) (04/24 0502) Pulse Rate:  [92-98] 93 (04/24 0502) Resp:  [16-18] 18 (04/24 0502) BP: (96-110)/(60-71) 96/60 mmHg (04/24 0502) SpO2:  [98 %-99 %] 98 % (04/24 0502) Last BM Date: 08/20/13  Intake/Output from previous day: 04/23 0701 - 04/24 0700 In: 2422 [P.O.:1422; I.V.:1000] Out: 1800 [Urine:1800] Intake/Output this shift:    Wound stable  Lab Results:   Recent Labs  08/20/13 0743 08/21/13 0656  WBC 14.2* 16.0*  HGB 8.4* 8.7*  HCT 25.2* 26.0*  PLT 305 374   BMET  Recent Labs  08/20/13 0743 08/21/13 0656  NA 136* 139  K 4.1 3.7  CL 103 101  CO2 22 25  GLUCOSE 282* 163*  BUN 5* 3*  CREATININE 0.40* 0.38*  CALCIUM 8.3* 8.8   PT/INR No results found for this basename: LABPROT, INR,  in the last 72 hours ABG No results found for this basename: PHART, PCO2, PO2, HCO3,  in the last 72 hours  Studies/Results: No results found.  Anti-infectives: Anti-infectives   Start     Dose/Rate Route Frequency Ordered Stop   08/20/13 1400  levofloxacin (LEVAQUIN) tablet 500 mg     500 mg Oral Daily 08/20/13 1305     08/19/13 0930  vancomycin (VANCOCIN) IVPB 750 mg/150 ml premix  Status:  Discontinued     750 mg 150 mL/hr over 60 Minutes Intravenous Every 8 hours 08/19/13 0908 08/20/13 1305   08/18/13 2000  ciprofloxacin (CIPRO) IVPB 400 mg  Status:  Discontinued     400 mg 200 mL/hr over 60 Minutes Intravenous Every 12 hours 08/18/13 1832 08/20/13 1305   08/18/13 2000  metroNIDAZOLE (FLAGYL) IVPB 500 mg  Status:  Discontinued     500 mg 100 mL/hr over 60 Minutes Intravenous Every 8 hours 08/18/13 1832 08/20/13 1121   08/17/13 1000  ceFEPIme (MAXIPIME) 1 g in dextrose 5 % 50 mL IVPB  Status:  Discontinued     1 g 100 mL/hr over 30 Minutes Intravenous Every 8 hours 08/17/13 0925 08/18/13  1832   08/17/13 0900  vancomycin (VANCOCIN) 500 mg in sodium chloride 0.9 % 100 mL IVPB  Status:  Discontinued     500 mg 100 mL/hr over 60 Minutes Intravenous Every 8 hours 08/17/13 0803 08/19/13 0908      Assessment/Plan: s/p Procedure(s): IRRIGATION AND DEBRIDEMENT GLUTEAL ABSCESS (Right)  Continue wound care, BID wet to dry dressing changes  LOS: 5 days    Ashley Freeman 08/21/2013

## 2013-08-21 NOTE — Progress Notes (Addendum)
Spoke to MD and Case manger regarding insulin vouchers for patient.  She can get a free box of both Lantus and Humalog.  MD will send Rx's for insulin to outpatient pharmacy.  Will also need insulin pen needles (may be able to get samples in office).  Patient will need to get new glucose meter.  Meter at outpatient pharmacy is 10$ however it only comes with 10 strips.  May be better off getting meter from [facility] or [facility] b/c the strips are less expensive.  Left coupons for Lantus/Humalog on shadow chart. Insulin will last patient 2-3 months however will need sustainable way to obtain insulin after these Rx.'s run out.  She has appt at clinic regarding orange card next week (according to case manager).  Also would benefit from counseling regarding chronic disease management and compliance.  Will discuss with patient.    Adah Perl, RN, BC-ADM Inpatient Diabetes Coordinator Pager [phone]  Had long conversation with mother and patient.  Explained consequences of not taking insulin.  Discussed importance of taking insulin.  She states that she "wants to do better" by taking her medicine, checking her blood sugars and going to her dr's appointments.  Discussed possibility of the need for counseling.  Mother states "you need to go and get help like me".  Discussed with Dr. Ronnald Ramp.  Also told Mom to get meter at "[employer]".  Patient's affect very flat.  No further recs. At this time.

## 2013-08-21 NOTE — Discharge Summary (Signed)
Name: Ashley Freeman MRN: [id number] DOB: 1990/05/26 23 y.o. PCP: Cresenciano Genre, MD  Date of Admission: 08/16/2013 10:21 PM Date of Discharge: 08/21/2013 Attending Physician: Murlean Caller  Discharge Diagnosis: 1. DKA 2. Right Gluteal Abscess 3. Poor Insight & Judgment  Discharge Medications:   Medication List    STOP taking these medications       insulin aspart 100 UNIT/ML FlexPen  Commonly known as:  NOVOLOG      TAKE these medications       insulin glargine 100 unit/mL Sopn  Commonly known as:  LANTUS  Inject 0.2 mLs (20 Units total) into the skin every morning.     insulin lispro 100 UNIT/ML KiwkPen  Commonly known as:  HUMALOG KWIKPEN  Inject 0.03 mLs (3 Units total) into the skin 3 (three) times daily.     Insulin Pen Needle 32G X 4 MM Misc  For use with Lantus and Novolog insulin pens 4 times daily, in the morning and with meals     levofloxacin 500 MG tablet  Commonly known as:  LEVAQUIN  Take 1 tablet (500 mg total) by mouth daily.     oxyCODONE-acetaminophen 5-325 MG per tablet  Commonly known as:  PERCOCET/ROXICET  Take 1-2 tablets by mouth every 12 (twelve) hours as needed for severe pain.        Disposition and follow-up:   Ashley Freeman was discharged from [facility] in Good condition.  At the hospital follow up visit please address:  1.  DKA; Patient w/ extensive h/o non-compliance w/ insulin, even though not taking insulin could be a significant threat to her life. Sent home w/ full box Lantus Solostar pens + Humalog short acting pens + pen needles and coupon for new meter, strips, and lancets. Please assess patient for symptoms of DKA (nausea, vomiting, abdominal pain). Need to make it clear that poor sugar control could result in poor healing of gluteal abscess as well.  -Discharged on Lantus 20 units + Short acting insulin 3 units tid. MAY NEED TO INCREASE TO 5 UNITS TID.  Right Gluteal Abscess; Given HHRN to assist w/  dressing changes AND to teach mother to change dressing when [facility] is not able to come. Wound needs to be packed and a wet to dry dressing needs to be applied. Follow up w/ Dr Therese Sarah below. Please inspect wound. Clean? Dry? Drainage? Is patient compliant w/ ABx? Fever or chills?  Poor Insight; Psych consulted during admission, patient found to have capacity, however, still non-compliant w/ life-sustaining medication. Patient needs significant assistance w/ Insulin administration because she does not do it herself. HHRN in place to assist somewhat w/ medication management as well as wound care (as above).  -Discussed psychology therapy sessions w/ mother and patient during admission, are willing to try this. Please discuss w/ SW to schedule outpatient psychology visits.  2.  Labs / imaging needed at time of follow-up: BMP, magnesium, CBC, CBG. HBA1c in 3 months.  3.  Pending labs/ test needing follow-up: none  Follow-up Appointments:     Follow-up Information   Follow up with Cresenciano Genre, MD On 08/24/2013. (8:45 AM)    Specialty:  Internal Medicine   Contact information:   [place] [address] [phone]       Follow up with Plyler, Butch Penny, RD On 08/24/2013. (9:30 AM)    Specialty:  [place] information:   [place] [address]  [phone]       Follow up with Reyes Ivan, MD On 09/16/2013. (arrive by 4:45PM for a 5PM appt For wound re-check)    Specialty:  General Surgery   Contact information:   [id number] [place] [address] [phone]       Follow up with [place]. [facility] for wound management and medication management)    Contact information:   [address] [place] [address] [phone]       Discharge Instructions: Discharge Orders   Future Appointments Provider Department Dept Phone   08/24/2013 8:45 AM Cresenciano Genre, MD [place]  [phone]   08/24/2013 9:30 AM Resa Miner, [place] [facility] [place] [phone]   09/16/2013 5:00 PM Ralene Ok, MD [facility], [place] [phone]   Future Orders Complete By Expires   Call MD for:  extreme fatigue  As directed    Call MD for:  persistant dizziness or light-headedness  As directed    Call MD for:  persistant nausea and vomiting  As directed    Call MD for:  severe uncontrolled pain  As directed       Consultations: Treatment Team:  Md Edison Pace, MD  Procedures Performed:  Dg Chest 2 View  08/17/2013   CLINICAL DATA:  Hyperglycemia diabetes shortness of breath.  EXAM: CHEST  2 VIEW  COMPARISON:  08/07/2013  FINDINGS: The heart size and mediastinal contours are within normal limits. Both lungs are clear. The visualized skeletal structures are unremarkable.  IMPRESSION: No active cardiopulmonary disease.   Electronically Signed   By: Lucienne Capers M.D.   On: 08/17/2013 00:05   Dg Chest Port 1 View  08/07/2013   CLINICAL DATA:  Shortness of breath, respiratory difficulty.  EXAM: PORTABLE CHEST - 1 VIEW  COMPARISON:  DG CHEST 1V PORT dated 10/26/2012  FINDINGS: The heart size and mediastinal contours are within normal limits. Both lungs are clear. The visualized skeletal structures are unremarkable. Gas distended stomach.  IMPRESSION: No acute cardiopulmonary process.   Electronically Signed   By: Elon Alas   On: 08/07/2013 20:54   Admission HPI: Ashley Freeman is a 23 year old woman with history of DM1 (A1C 19.3%) and medication noncompliance who presents with DKA.  Per EMS, patient was combative and refusing to come to the hospital. Patient states her only symptoms have been "feeling sleepy." She has been noncompliant with her diabetes regimen stating she doesn't have any insulin at home "because it doesn't work." She has not taken any Lantus or Novolog since discharge on 08/12/13 (admitted for DKA). Denies fever, chest pain, shortness of  breath, cough, abdominal pain, nausea/vomiting, diarrhea, polyuria, polydipsia, dysuria, hematuria, numbness/tingling, weakness, or syncope. In ED, labs consistent with DKA: glucose 961, HCO3 <7, AG 39, pH 7.16, lactic acid 4.2, UA positive for glucosuria and ketonuria. She was started on IV hydration, insulin, admitted to IMTS for further management.  Hospital Course by problem list:   1. DKA- A1C 19.3%. On admission, glucose 961, HCO3 <7, AG 39, + ketonuria. ABG showed pH 7.16, pCO2 7.3, pO2 131, HCO3 2.5. Lactic acid 4.2, decreased to normal w/in 1 day. Troponin x 1 negative. Leukocytosis of 46.3, thought to be 2/2 leukemoid reaction initially, but likely 2/2 large gluteal abscess (as below). DKA very likely precipitated by lack of insulin as patient admits to taking no insulin since hospital discharge on 08/12/13 and has long history of noncompliance. Further  into admission, patient claimed she was taking insulin, however mother says this was not the case. Right femoral line placed in ED. Started on DKA protocol + HCO3 initially for severely low levels. Patient w/ significant electrolyte abnormalities while on Insulin gtt, most significantly hypokalemia and hypophosphatemia. Insulin gtt had to be stopped intermittently to allow for K supplementation (K < 3.3 on 2-3 occasions). Also supplemented phosphate x2 (Phos <1). Changed to Lantus 20 units daily (08/19/13) w/ ISS-M + HS coverage w/ good control of CBG's. Discharged on Lantus 20 units + short acting insulin 3 units tid wc for compliance (patient not able to properly use a SS at this time). Given box of Lantus + Humalog pens + pen needles on discharge.  2. Right Gluteal Abscess- Admitted w/ significant leukocytosis of 46.3, originally thought to be 2/2 leukemoid reaction, started on Vancomycin + Cefepime on admission. Patient found to have a right gluteal abscess, discovered by nurse. Previously, patient refused full skin and GU exam and denied ulcers or  sores when asked on several occasions. RN helped patient to bathroom on afternoon of 08/18/13, noticed area of tenderness and erythema on the right gluteal fold. Patient claimed it had been present for 3-4 days, mother said it had been present for 2 weeks. Significant tenderness, erythema, induration and fluctuance on exam (8 x 10 cm). Seen by Dr. Rosendo Gros (general surgery), performed I&D in PM of 08/18/13, drained large amount of purulent fluid from abscess. Patient w/ significant improvement in symptoms s/p I&D. Continued on Vancomycin + Cipro + Flagyl (started by surgery). Leukocytosis significantly improved over time. Wound gram stain w/ gram positive cocci, rods, and gram negative rods, but culture found to have multiple organisms, however no staph aureus or group A strep. Changed ABx to Levaquin for total of 10 days. Discharged home w/ [facility] to assist w/ dressing changes.   3. Poor Insight & Judgment- Patient w/ multiple admissions for severe DKA, extremely non-compliant w/ insulin as well as doctor visits. Patient does not seem to understand the severity of her illness and the possible consequence of death if this pattern continues. Discussed at length with patient and her mother during multiple occasions during admission, patient does not seem to have the ability to take care of herself with regards to her DM, despite her being a legal adult. On discharge from the hospital, patient is at continuous risk to herself as she is completely unwilling to use insulin, a situation that could one day lead to her death. Her mother also understands this fact. Psych consulted, patient found to have capacity. Given insulin on discharge (described above) w/ HHRN to assist w/ medication management. Discussed psychology as an outpatient, patient and mother willing to try this.  Discharge Vitals:   BP 98/68  Pulse 138  Temp(Src) 98.6 F (37 C) (Oral)  Resp 18  Ht 5\' 5"  (1.651 m)  Wt 120 lb (54.432 kg)  BMI 19.97 kg/m2   SpO2 99%  LMP 07/17/2013  Discharge Labs:  Results for orders placed during the hospital encounter of 08/16/13 (from the past 24 hour(s))  GLUCOSE, CAPILLARY     Status: Abnormal   Collection Time    08/20/13  5:11 PM      Result Value Ref Range   Glucose-Capillary 212 (*) 70 - 99 mg/dL  GLUCOSE, CAPILLARY     Status: Abnormal   Collection Time    08/20/13  8:42 PM      Result Value Ref Range   Glucose-Capillary  155 (*) 70 - 99 mg/dL   Comment 1 Documented in Chart     Comment 2 Notify RN    CBC     Status: Abnormal   Collection Time    08/21/13  6:56 AM      Result Value Ref Range   WBC 16.0 (*) 4.0 - 10.5 K/uL   RBC 3.15 (*) 3.87 - 5.11 MIL/uL   Hemoglobin 8.7 (*) 12.0 - 15.0 g/dL   HCT 26.0 (*) 36.0 - 46.0 %   MCV 82.5  78.0 - 100.0 fL   MCH 27.6  26.0 - 34.0 pg   MCHC 33.5  30.0 - 36.0 g/dL   RDW 13.5  11.5 - 15.5 %   Platelets 374  150 - 400 K/uL  BASIC METABOLIC PANEL     Status: Abnormal   Collection Time    08/21/13  6:56 AM      Result Value Ref Range   Sodium 139  137 - 147 mEq/L   Potassium 3.7  3.7 - 5.3 mEq/L   Chloride 101  96 - 112 mEq/L   CO2 25  19 - 32 mEq/L   Glucose, Bld 163 (*) 70 - 99 mg/dL   BUN 3 (*) 6 - 23 mg/dL   Creatinine, Ser 0.38 (*) 0.50 - 1.10 mg/dL   Calcium 8.8  8.4 - 10.5 mg/dL   GFR calc non Af Amer >90  >90 mL/min   GFR calc Af Amer >90  >90 mL/min  MAGNESIUM     Status: None   Collection Time    08/21/13  6:56 AM      Result Value Ref Range   Magnesium 1.9  1.5 - 2.5 mg/dL  GLUCOSE, CAPILLARY     Status: Abnormal   Collection Time    08/21/13  7:41 AM      Result Value Ref Range   Glucose-Capillary 151 (*) 70 - 99 mg/dL  GLUCOSE, CAPILLARY     Status: Abnormal   Collection Time    08/21/13 11:52 AM      Result Value Ref Range   Glucose-Capillary 173 (*) 70 - 99 mg/dL    Signed: Corky Sox, MD 08/21/2013, 2:38 PM   Time Spent on Discharge: 60 minutes Services Ordered on Discharge: HHRN Equipment Ordered on  Discharge: none

## 2013-08-21 NOTE — Progress Notes (Signed)
DC home with mom. Both verbally understood DC instructions, [place] set up by case manager and meds supplies giaven to pt.[name]

## 2013-08-21 NOTE — Discharge Instructions (Signed)
1. You have the following appointments scheduled:  Cresenciano Genre  On 08/24/2013 8:45 AM  [place] [address] [phone]  Donna M Plyler, RD  On 08/24/2013 9:30 AM  [address] [place] [address] [phone]  2. Please take all medications as prescribed. Please take Lantus 20 units Daily + Humalog 3 units three units daily with meals. CHECK YOUR BLOOD SUGAR. If blood sugar is <150, do not inject Humalog.  NEVER MISS A DOSE OF LANTUS.  3. If you have worsening of your symptoms or new symptoms arise, please call the clinic [phone]), or go to the ER immediately if symptoms are severe.  You have done a great job in taking all your medications. I appreciate it very much. Please continue doing that.  Diabetic Ketoacidosis Diabetic ketoacidosis (DKA) is a life-threatening complication of type 1 diabetes. It must be quickly recognized and treated. Treatment requires hospitalization. CAUSES  When there is no insulin in the body, glucose (sugar) cannot be used and the body breaks down fat for energy. When fat breaks down, acids (ketones) build up in the blood. Very high levels of glucose and high levels of acids lead to severe loss of body fluids (dehydration) and other dangerous chemical changes. This stresses your vital organs and can cause coma or death. SYMPTOMS   Tiredness (fatigue).  Weight loss.  Excessive thirst.  Ketones in the urine.  Lightheadedness.  Fruity or sweet smell on your breath.  Excessive urination.  Visual changes.  Confusion or irritability.  Feeling sick to your stomach (nauseous) or vomiting.  Rapid breathing.  Stomachache or belly (abdominal) pain. DIAGNOSIS  Your caregiver will diagnose DKA based on your history, physical exam, and blood tests. Your caregiver will check if there is another illness present which caused you to go into DKA. Most of this will be done quickly in an emergency room. TREATMENT   Fluid  replacement to correct dehydration.  Insulin.  Correction of electrolytes, such as potassium and sodium.  Medicines (antibiotics) that kill germs for infections. PREVENTION  Always take your insulin. Do not skip your insulin injections.  If you are ill, treat yourself quickly. Your body often needs more insulin to fight the illness.  Check your blood glucose regularly.  Check urine ketones if your blood glucose is greater than 240 milligrams per deciliter (mg/dl).  Do not used expired or outdated insulin.  If your blood glucose is high, drink plenty of fluids. This helps flush out ketones. HOME CARE INSTRUCTIONS   If you are ill, follow the advice of your caregiver.  To prevent loss of body fluids (dehydration), drink enough water and fluids to keep your urine clear or pale yellow.  If you cannot eat, alternate between drinking fluids with sugar (soda, juices, flavored gelatin) and salty fluids (broth, bouillon).  If you can eat, follow your usual diet and drink sugar-free liquids (water, diet drinks).  Always take your usual dose of insulin. If you cannot eat, or your glucose is getting too low, call your caregiver for further instructions.  Continue to monitor your blood or urine ketones every 3 to 4 hours around the clock. Set your alarm clock or have someone wake you up. If you are too sick, have someone test it for you.  Rest and avoid exercise. SEEK MEDICAL CARE IF:   You have ketones in your urine or your blood glucose is higher than a level your caregiver suggests. You may need extra insulin. Call  your caregiver if you need advice on adjusting your insulin.  You cannot drink at least a tablespoon of fluid every 15 to 20 minutes.  You have been throwing up for more than 2 hours.  You have symptoms of DKA:  Fruity smelling breath.  Breathing faster or slower.  Becoming very sleepy. SEEK IMMEDIATE MEDICAL CARE IF:   You have signs of dehydration:  Decreased  urination.  Increased thirst.  Dry skin and mouth.  Lightheadedness.  Your blood glucose is very high (as advised by your caregiver) twice in a row.  You or your child has an oral temperature above 102 F (38.9 C), not controlled by medicine.  You pass out.  You have chest pain and/or trouble breathing.  You have a sudden, severe headache.  You have sudden weakness in one arm and/or one leg.  You have sudden difficulty speaking and/or swallowing.  You develop vomiting and/or diarrhea that is getting worse after 3 to 4 hours.  You have abdominal pain. MAKE SURE YOU:   Understand these instructions.  Will watch your condition.  Will get help right away if you are not doing well or get worse. Document Released: 04/13/2000 Document Revised: 07/09/2011 Document Reviewed: 10/20/2008 ExitCare Patient Information 2014 [place]  It is important that the wound be kept open.   -Keeping the skin edges apart will allow the wound to gradually heal from the base upwards.   - If the skin edges of the wound close too early, a new fluid pocket can form and infection can occur. -This is the reason to pack deeper wounds with gauze or ribbon -This is why drained wounds cannot be sewed closed right away  A healthy wound should form a lining of bright red "beefy" granulating tissue that will help shrink the wound and help the edges grow new skin into it.   -A little mucus / yellow discharge is normal (the body's natural way to try and form a scab) and should be gently washed off with soap and water with daily dressing changes.  -Green or foul smelling drainage implies bacterial colonization and can slow wound healing - a short course of antibiotic ointment (3-5 days) can help it clear up.  Call the doctor if it does not improve or worsens  -Avoid use of antibiotic ointments for more than a week as they can slow wound healing over time.    -Sometimes other wound care  products will be used to reduce need for dressing changes and/or help clean up dirty wounds -Sometimes the surgeon needs to debride the wound in the office to remove dead or infected tissue out of the wound so it can heal more quickly and safely.    Change the dressing at least once a day -Wash the wound with mild soap and water gently every day.  It is good to shower or bathe the wound to help it clean out. -Use clean 4x4 gauze for medium/large wounds or ribbon plain NU-gauze for smaller wounds (it does not need to be sterile, just clean) -Keep the raw wound moist with a little saline or KY (saline) gel on the gauze.  -A dry wound will take longer to heal.  -Keep the skin dry around the wound to prevent breakdown and irritation. -Pack the wound down to the base -The goal is to keep the skin apart, not overpack the wound -Use a Q-tip or blunt-tipped kabob stick toothpick to push the gauze down to the  base in narrow or deep wounds   -Cover with a clean gauze and tape -paper or Medipore tape tend to be gentle on the skin -rotate the orientation of the tape to avoid repeated stress/trauma on the skin -using an ACE or Coban wrap on wounds on arms or legs can be used instead.  Complete all antibiotics through the entire prescription to help the infection heal and prevent new places of infection   Returning the see the surgeon is helpful to follow the healing process and help the wound close as fast as possible.

## 2013-08-21 NOTE — Progress Notes (Signed)
Subjective: Patient seen at bedside this AM. Seems clinically improved today. No complaints, says she is eating well, only has pain w/ wound care. No fever, chills, nausea, or vomiting.   Objective: Vital signs in last 24 hours: Filed Vitals:   08/20/13 1045 08/20/13 1351 08/20/13 2045 08/21/13 0502  BP: 105/70 110/71 105/70 96/60  Pulse: 92 98 98 93  Temp: 98.2 F (36.8 C) 98.2 F (36.8 C) 99 F (37.2 C) 98 F (36.7 C)  TempSrc: Oral Oral Oral Oral  Resp: 16 16 18 18   Height:      Weight:      SpO2: 99% 99% 99% 98%   Weight change:   Intake/Output Summary (Last 24 hours) at 08/21/13 0931 Last data filed at 08/20/13 2348  Gross per 24 hour  Intake   2182 ml  Output   1800 ml  Net    382 ml   Physical Exam: General: Alert, cooperative, NAD HEENT: PERRL, EOMI. Moist mucus membranes Neck: Full range of motion without pain, supple, no lymphadenopathy or carotid bruits Lungs: Clear to ascultation bilaterally, normal work of respiration, no wheezes, rales, rhonchi Heart: RRR, no murmurs, gallops, or rubs Abdomen: Soft, non-tender, non-distended, BS + Extremities: No cyanosis, clubbing, or edema. Right gluteal abscess w/dry dressing, clean, dry, intact. Neurologic: Alert & oriented X3, cranial nerves II-XII intact, strength grossly intact, sensation intact to light touch  Lab Results: Basic Metabolic Panel:  Recent Labs Lab 08/19/13 1906  08/20/13 0743 08/21/13 0656  NA 135*  < > 136* 139  K 4.1  < > 4.1 3.7  CL 97  < > 103 101  CO2 21  < > 22 25  GLUCOSE 278*  < > 282* 163*  BUN 5*  < > 5* 3*  CREATININE 0.46*  < > 0.40* 0.38*  CALCIUM 8.5  < > 8.3* 8.8  MG 1.8  --  1.8 1.9  PHOS 1.3*  --  2.7  --   < > = values in this interval not displayed.  CBC:  Recent Labs Lab 08/17/13 1752  08/20/13 0743 08/21/13 0656  WBC 26.3*  < > 14.2* 16.0*  NEUTROABS 20.5*  --  9.7*  --   HGB 9.0*  < > 8.4* 8.7*  HCT 26.1*  < > 25.2* 26.0*  MCV 80.3  < > 82.6 82.5  PLT  327  < > 305 374  < > = values in this interval not displayed.  Cardiac Enzymes:  Recent Labs Lab 08/16/13 2235  TROPONINI <0.30   CBG:  Recent Labs Lab 08/20/13 0250 08/20/13 0758 08/20/13 1150 08/20/13 1711 08/20/13 2042 08/21/13 0741  GLUCAP 283* 239* 250* 212* 155* 151*   Urinalysis:  Recent Labs Lab 08/16/13 2339 08/17/13 1605  COLORURINE YELLOW YELLOW  LABSPEC 1.029 1.025  PHURINE 5.0 6.0  GLUCOSEU >1000* >1000*  HGBUR NEGATIVE TRACE*  BILIRUBINUR NEGATIVE NEGATIVE  KETONESUR >80* >80*  PROTEINUR 30* 100*  UROBILINOGEN 0.2 0.2  NITRITE NEGATIVE NEGATIVE  LEUKOCYTESUR NEGATIVE SMALL*    Micro Results: Recent Results (from the past 240 hour(s))  URINE CULTURE     Status: None   Collection Time    08/16/13 11:39 PM      Result Value Ref Range Status   Specimen Description URINE, CATHETERIZED   Final   Special Requests NONE   Final   Culture  Setup Time     Final   Value: 08/17/2013 10:14     Performed at [facility]  Colony Count     Final   Value: NO GROWTH     Performed at [facility]   Culture     Final   Value: NO GROWTH     Performed at [facility]   Report Status 08/18/2013 FINAL   Final  CULTURE, BLOOD (ROUTINE X 2)     Status: None   Collection Time    08/17/13  1:00 AM      Result Value Ref Range Status   Specimen Description BLOOD LEFT FEMORAL VEIN   Final   Special Requests BOTTLES DRAWN AEROBIC ONLY 6CC   Final   Culture  Setup Time     Final   Value: 08/17/2013 10:09     Performed at [facility]   Culture     Final   Value:        BLOOD CULTURE RECEIVED NO GROWTH TO DATE CULTURE WILL BE HELD FOR 5 DAYS BEFORE ISSUING A FINAL NEGATIVE REPORT     Performed at [facility]   Report Status PENDING   Incomplete  CULTURE, BLOOD (ROUTINE X 2)     Status: None   Collection Time    08/17/13  1:00 AM      Result Value Ref Range Status   Specimen Description BLOOD LEFT FEMORAL ARTERY   Final     Special Requests BOTTLES DRAWN AEROBIC ONLY 5CC   Final   Culture  Setup Time     Final   Value: 08/17/2013 10:09     Performed at [facility]   Culture     Final   Value:        BLOOD CULTURE RECEIVED NO GROWTH TO DATE CULTURE WILL BE HELD FOR 5 DAYS BEFORE ISSUING A FINAL NEGATIVE REPORT     Performed at [facility]   Report Status PENDING   Incomplete  CULTURE, BLOOD (ROUTINE X 2)     Status: None   Collection Time    08/17/13  8:52 AM      Result Value Ref Range Status   Specimen Description BLOOD LEFT ANTECUBITAL   Final   Special Requests BOTTLES DRAWN AEROBIC ONLY [facility]   Final   Culture  Setup Time     Final   Value: 08/17/2013 13:30     Performed at [facility]   Culture     Final   Value:        BLOOD CULTURE RECEIVED NO GROWTH TO DATE CULTURE WILL BE HELD FOR 5 DAYS BEFORE ISSUING A FINAL NEGATIVE REPORT     Performed at [facility]   Report Status PENDING   Incomplete  CULTURE, BLOOD (ROUTINE X 2)     Status: None   Collection Time    08/17/13  9:00 AM      Result Value Ref Range Status   Specimen Description BLOOD LEFT HAND   Final   Special Requests BOTTLES DRAWN AEROBIC ONLY [facility]   Final   Culture  Setup Time     Final   Value: 08/17/2013 13:31     Performed at [facility]   Culture     Final   Value:        BLOOD CULTURE RECEIVED NO GROWTH TO DATE CULTURE WILL BE HELD FOR 5 DAYS BEFORE ISSUING A FINAL NEGATIVE REPORT     Performed at [facility]   Report Status PENDING   Incomplete  GC/CHLAMYDIA PROBE AMP  Status: None   Collection Time    08/17/13  4:05 PM      Result Value Ref Range Status   CT Probe RNA NEGATIVE  NEGATIVE Final   GC Probe RNA NEGATIVE  NEGATIVE Final   Comment: (NOTE)                                                                                               **Normal Reference Range: Negative**          Assay performed using the Gen-Probe APTIMA COMBO2 (R) Assay.     Acceptable  specimen types for this assay include APTIMA Swabs (Unisex,     endocervical, urethral, or vaginal), first void urine, and ThinPrep     liquid based cytology samples.     Performed at [facility]     Status: None   Collection Time    08/18/13  7:35 PM      Result Value Ref Range Status   Specimen Description ABSCESS BUTTOCKS   Final   Special Requests     Final   Value: PATIENT ON FOLLOWING VANCOMYCIN RIGHT GLUTEAL ABSCESS   Gram Stain     Final   Value: MODERATE WBC PRESENT, PREDOMINANTLY PMN     NO SQUAMOUS EPITHELIAL CELLS SEEN     ABUNDANT GRAM POSITIVE COCCI IN PAIRS     ABUNDANT GRAM POSITIVE RODS     ABUNDANT GRAM NEGATIVE RODS   Culture     Final   Value: NO ANAEROBES ISOLATED; CULTURE IN PROGRESS FOR 5 DAYS     Performed at [facility]   Report Status PENDING   Incomplete  CULTURE, ROUTINE-ABSCESS     Status: None   Collection Time    08/18/13  7:35 PM      Result Value Ref Range Status   Specimen Description ABSCESS BUTTOCKS   Final   Special Requests     Final   Value: PATIENT ON FOLLOWING VANCOMYCIN RIGHT GLUTEAL ABSCESS   Gram Stain     Final   Value: MODERATE WBC PRESENT, PREDOMINANTLY PMN     NO SQUAMOUS EPITHELIAL CELLS SEEN     ABUNDANT GRAM POSITIVE COCCI IN PAIRS     ABUNDANT GRAM POSITIVE RODS     ABUNDANT GRAM NEGATIVE RODS   Culture     Final   Value: MULTIPLE ORGANISMS PRESENT, NONE PREDOMINANT     Note: NO STAPHYLOCOCCUS AUREUS ISOLATED NO GROUP A STREP (S.PYOGENES) ISOLATED     Performed at [facility]   Report Status 08/21/2013 FINAL   Final    Medications: I have reviewed the patient's current medications. Scheduled Meds: . enoxaparin (LOVENOX) injection  40 mg Subcutaneous Q24H  . insulin aspart  0-15 Units Subcutaneous TID WC  . insulin aspart  0-5 Units Subcutaneous QHS  . insulin glargine  20 Units Subcutaneous Daily  . levofloxacin  500 mg Oral Daily   Continuous Infusions:   PRN  Meds:.acetaminophen, dextrose, ibuprofen, oxyCODONE-acetaminophen  Assessment/Plan: Ms. TAZHANE DIETRICH is a 23 y.o. female w/ PMHx of Type I DM w/  multiple admissions for severe DKA, admitted for DKA, also found to have right gluteal abscess.  Diabetic Ketoacidosis- Blood sugars well controlled, BMP wnl. No complaints per patient.  -Continue Lantus 20 units daily  -Continue ISS-M + CBG's AC/HS. -Zofran prn -Given recent Novolog requirements, will discharge patient w/ Lantus 20 units + Humalog 3 units tid w/ meals. Patient has a follow up appointment on 08/24/13 in clinic where adjustments can be made. May need to increase Humalog to 5 units tid. Diabetes coordinator has coordinated free Lantus Solostar + Humalog Kwikpens for patient to use at home. -Will also arrange for patient to receive new meter, test strips, and lancets.   Right Gluteal Abscess- POD #3. Healing well, only pain w/ dressing changes. Changed to Levaquin 500 mg po qd yesterday. WBC's slightly increased from yesterday.  -Continue Levaquin 500 mg po qd (day 6/10).  -Patient stable for discharge as per surgery w/ HHRN to assist w/ dressing changes.   Poor insight/judgment-  Patient w/ extensive history of Insulin non-compliance. D/w CM yesterday, patient would require 30 days SNF placement w/ Medicaid, patient refused. Discussed situation at length w/ team, patient is stable to discharge home w/ appropriate care involving wound management + Insulin. Even though patient is a harm to herself in that she is non-compliant w/ her life-sustaining medication (insulin), she is legally an adult and has been deemed to have capacity by psychiatry during this admission.  -Arranged for insulin, meter + supplies (specifics above) to give to patient on discharge as during last admission, patient was unable to obtain insulin and supplies 2/2 cost issues and medicaid complications. Arranging for home health nurse to see patient at home, assist  w/ wound care, and medication management.  -Patient w/ close follow up in the [facility] clinic on 08/24/13.  DVT/PE PPx- Lovenox [place]  Dispo: Discharge home today.   The patient does have a current PCP Cresenciano Genre, MD) and does need an [facility] hospital follow-up appointment after discharge.  The patient does not have transportation limitations that hinder transportation to clinic appointments.  .Services Needed at time of discharge: Y = Yes, Blank = No PT:   OT:   RN:   Equipment:   Other:     LOS: 5 days   Corky Sox, MD 08/21/2013, 9:31 AM

## 2013-08-23 LAB — CULTURE, BLOOD (ROUTINE X 2)
Culture: NO GROWTH
Culture: NO GROWTH
Culture: NO GROWTH
Culture: NO GROWTH

## 2013-08-23 LAB — ANAEROBIC CULTURE

## 2013-08-24 ENCOUNTER — Ambulatory Visit (INDEPENDENT_AMBULATORY_CARE_PROVIDER_SITE_OTHER): Payer: Medicaid Other | Admitting: Internal Medicine

## 2013-08-24 ENCOUNTER — Ambulatory Visit (INDEPENDENT_AMBULATORY_CARE_PROVIDER_SITE_OTHER): Payer: Medicaid Other | Admitting: Dietician

## 2013-08-24 ENCOUNTER — Encounter: Payer: Self-pay | Admitting: Dietician

## 2013-08-24 ENCOUNTER — Encounter: Payer: Self-pay | Admitting: Internal Medicine

## 2013-08-24 VITALS — BP 100/68 | HR 117 | Temp 98.2°F | Ht 63.0 in | Wt 114.2 lb

## 2013-08-24 DIAGNOSIS — E109 Type 1 diabetes mellitus without complications: Secondary | ICD-10-CM

## 2013-08-24 DIAGNOSIS — L0231 Cutaneous abscess of buttock: Secondary | ICD-10-CM

## 2013-08-24 DIAGNOSIS — Z0000 Encounter for general adult medical examination without abnormal findings: Secondary | ICD-10-CM

## 2013-08-24 DIAGNOSIS — L03317 Cellulitis of buttock: Secondary | ICD-10-CM

## 2013-08-24 DIAGNOSIS — Z01810 Encounter for preprocedural cardiovascular examination: Secondary | ICD-10-CM | POA: Insufficient documentation

## 2013-08-24 HISTORY — DX: Cutaneous abscess of buttock: L02.31

## 2013-08-24 LAB — PHOSPHORUS: PHOSPHORUS: 3.9 mg/dL (ref 2.3–4.6)

## 2013-08-24 LAB — GLUCOSE, CAPILLARY: GLUCOSE-CAPILLARY: 306 mg/dL — AB (ref 70–99)

## 2013-08-24 LAB — BASIC METABOLIC PANEL WITH GFR
BUN: 9 mg/dL (ref 6–23)
CO2: 26 meq/L (ref 19–32)
Calcium: 9.1 mg/dL (ref 8.4–10.5)
Chloride: 100 mEq/L (ref 96–112)
Creat: 0.51 mg/dL (ref 0.50–1.10)
GFR, Est African American: >89 mL/min
GFR, Est Non African American: >89 mL/min
GLUCOSE: 340 mg/dL — AB (ref 70–99)
POTASSIUM: 4.7 meq/L (ref 3.5–5.3)
SODIUM: 135 meq/L (ref 135–145)

## 2013-08-24 LAB — MAGNESIUM: Magnesium: 1.8 mg/dL (ref 1.5–2.5)

## 2013-08-24 MED ORDER — OXYCODONE-ACETAMINOPHEN 5-325 MG PO TABS: 1.0000 | ORAL_TABLET | Freq: Two times a day (BID) | ORAL | Status: DC | PRN

## 2013-08-24 NOTE — Patient Instructions (Addendum)
General Instructions: Please follow up in 3 months (10/2013) Please take the last 2 pills of your antibiotics Take 20 units of Lantus in the morning and take 3 units of Humalog three times a day (or based on your blood glucose or meals) Thanks for coming today    Treatment Goals:  Goals (1 Years of Data) as of 08/24/13         As of Today 08/21/13 08/21/13 08/21/13 08/20/13     Blood Pressure    . Blood Pressure < 140/90  100/68 98/68 99/66  96/60 105/70    . Blood Pressure < 140/90  100/68 98/68 99/66  96/60 105/70     Result Component    . HEMOGLOBIN A1C < 7.0          . HEMOGLOBIN A1C < 7.0          . LDL CALC < 100          . LDL CALC < 100            Progress Toward Treatment Goals:  Treatment Goal 08/24/2013  Hemoglobin A1C deteriorated    Self Care Goals & Plans:  Self Care Goal 08/24/2013  Manage my medications take my medicines as prescribed; bring my medications to every visit; refill my medications on time  Monitor my health keep track of my blood glucose; bring my glucose meter and log to each visit; check my feet daily; keep track of my blood pressure; bring my blood pressure log to each visit; keep track of my weight  Eat healthy foods drink diet soda or water instead of juice or soda; eat baked foods instead of fried foods; eat fruit for snacks and desserts; eat more vegetables; eat foods that are low in salt; eat smaller portions  Be physically active take a walk every day; find an activity I enjoy  Meeting treatment goals maintain the current self-care plan    Home Blood Glucose Monitoring 08/24/2013  Check my blood sugar 3 times a day  When to check my blood sugar before meals     Care Management & Community Referrals:  Referral 08/24/2013  Referrals made for care management support diabetes educator  Referrals made to community resources none     Type 1 Diabetes Mellitus, Adult Type 1 diabetes mellitus, often simply referred to as diabetes, is a long-term  (chronic) disease. It occurs when the islet cells in the pancreas that make insulin (a hormone) are destroyed and can no longer make insulin. Insulin is needed to move sugars from food into the tissue cells. The tissue cells use the sugars for energy. In people with type 1 diabetes, the sugars build up in the blood instead of going into the tissue cells. As a result, high blood sugar (hyperglycemia) develops. Without insulin, the body breaks down fat cells for the needed energy. This breakdown of fat cells produces acid chemicals (ketones), which increases the acid levels in the body. The effect of either high ketone or sugar (glucose) levels can be life-threatening.  Type 1 diabetes was also previously called juvenile diabetes. It most often occurs before the age of 35, but it can occur at any age. RISK FACTORS A person is predisposed to developing type 1 diabetes if someone in his or her family has the disease and is exposed to certain additional environmental triggers.  SYMPTOMS  Symptoms of type 1 diabetes may develop gradually over days to weeks or suddenly. The symptoms occur due to hyperglycemia. The symptoms can  include:   Increased thirst (polydipsia).  Increased urination (polyuria).  Increased urination during the night (nocturia).  Weight loss. This weight loss may be rapid.  Frequent, recurring infections.  Tiredness (fatigue).  Weakness.  Vision changes, such as blurred vision.  Fruity smell to your breath.  Abdominal pain.  Nausea or vomiting. DIAGNOSIS  Type 1 diabetes is diagnosed when symptoms of diabetes are present and when blood glucose levels are increased. Your blood glucose level may be checked by one or more of the following blood tests:  A fasting blood glucose test. You will not be allowed to eat for at least 8 hours before a blood sample is taken.  A random blood glucose test. Your blood glucose is checked at any time of the day regardless of when you  ate.  A hemoglobin A1c blood glucose test. A hemoglobin A1c test provides information about blood glucose control over the previous 3 months. TREATMENT  Although type 1 diabetes cannot be prevented, it can be managed with insulin, diet, and exercise.  You will need to take insulin daily to keep blood glucose in the desired range.  You will need to match insulin dosing with exercise and healthy food choices. The treatment goal is to maintain the before-meal blood sugar (preprandial glucose) level at 70 130 mg/dL.  HOME CARE INSTRUCTIONS   Have your hemoglobin A1c level checked twice a year.  Perform daily blood glucose monitoring as directed by your caregiver.  Monitor urine ketones when you are ill and as directed by your caregiver.  Take your insulin as directed by your caregiver to maintain your blood glucose level in the desired range.  Never run out of insulin. It is needed every day.  Adjust insulin based on your intake of carbohydrates. Carbohydrates can raise blood glucose levels but need to be included in your diet. Carbohydrates provide vitamins, minerals, and fiber, which are an essential part of a healthy diet. Carbohydrates are found in fruits, vegetables, whole grains, dairy products, legumes, and foods containing added sugars.    Eat healthy foods. Alternate 3 meals with 3 snacks.  Maintain a healthy weight.  Carry a medical alert card or wear your medical alert jewelry.  Carry a 15 gram carbohydrate snack with you at all times to treat low blood glucose (hypoglycemia). Some examples of 15 gram carbohydrate snacks include:  Glucose tablets, 3 or 4.   Glucose gel, 15 gram tube.  Raisins, 2 tablespoons (24 grams).  Jelly beans, 6.  Animal crackers, 8.  Fruit juice, regular soda, or low-fat milk, 4 ounces (120 mL).  Gummy treats, 9.    Recognize hypoglycemia. Hypoglycemia occurs with blood glucose levels of 70 mg/dL and below. The risk for hypoglycemia  increases when fasting or skipping meals, during or after intense exercise, and during sleep. Hypoglycemia symptoms can include:  Tremors or shakes.  Decreased ability to concentrate.  Sweating.  Increased heart rate.  Headache.  Dry mouth.  Hunger.  Irritability.  Anxiety.  Restless sleep.  Altered speech or coordination.  Confusion.  Treat hypoglycemia promptly. If you are alert and able to safely swallow, follow the 15:15 rule:  Take 15 20 grams of rapid-acting glucose or carbohydrate. Rapid-acting options include glucose gel, glucose tablets, or 4 ounces (120 mL) of fruit juice, regular soda, or low-fat milk.  Check your blood glucose level 15 minutes after taking the glucose.   Take 15 20 grams more of glucose if the repeat blood glucose level is still 70 mg/dL  or below.  Eat a meal or snack within 1 hour once blood glucose levels return to normal.  Be alert to polyuria and polydipsia, which are early signs of hyperglycemia. An early awareness of hyperglycemia allows for prompt treatment. Treat hyperglycemia as directed by your caregiver.  Engage in at least 150 minutes of moderate-intensity physical activity a week, spread over at least 3 days of the week or as directed by your caregiver.  Adjust your insulin dosing and food intake as needed if you start a new exercise or sport.  Follow your sick day plan at any time you are unable to eat or drink as usual.   Avoid tobacco use.  Limit alcohol intake to no more than 1 drink per day for nonpregnant women and 2 drinks per day for men. You should drink alcohol only when you are also eating food. Talk with your caregiver about whether alcohol is safe for you. Tell your caregiver if you drink alcohol several times a week.  Follow up with your caregiver regularly.  Schedule an eye exam within 5 years of diagnosis and then annually.  Perform daily skin and foot care. Examine your skin and feet daily for cuts,  bruises, redness, nail problems, bleeding, blisters, or sores. A foot exam by a caregiver should be done annually.  Brush your teeth and gums at least twice a day and floss at least once a day. Follow up with your dentist regularly.  Share your diabetes management plan with your workplace or school.  Stay up-to-date with immunizations.  Learn to manage stress.  Obtain ongoing diabetes education and support as needed.  Participate or seek rehabilitation as needed to maintain or improve independence and quality of life. Request a physical or occupational therapy referral if you are having foot or hand numbness or difficulties with grooming, dressing, eating, or physical activity. SEEK MEDICAL CARE IF:   You are unable to eat food or drink fluids for more than 6 hours.  You have nausea and vomiting for more than 6 hours.  Your blood glucose level is over 240 mg/dL.  There is a change in mental status.  You develop an additional serious illness.  You have diarrhea for more than 6 hours.  You have been sick or have had a fever for a couple of days and are not getting better.  You have pain during any physical activity. SEEK IMMEDIATE MEDICAL CARE IF:  You have difficulty breathing.  You have moderate to large ketone levels. MAKE SURE YOU:  Understand these instructions.  Will watch your condition.  Will get help right away if you are not doing well or get worse. Document Released: 04/13/2000 Document Revised: 01/09/2012 Document Reviewed: 11/13/2011 [facility] Patient Information 2014 [place].   Acetaminophen; Oxycodone tablets What is this medicine? ACETAMINOPHEN; OXYCODONE (a set a MEE noe fen; ox i KOE done) is a pain reliever. It is used to treat mild to moderate pain. This medicine may be used for other purposes; ask your health care provider or pharmacist if you have questions. COMMON BRAND NAME(S): Endocet, Magnacet, Narvox, Percocet, Perloxx, Primalev,  Primlev, Roxicet, Xolox What should I tell my health care provider before I take this medicine? They need to know if you have any of these conditions: -brain tumor -Crohn's disease, inflammatory bowel disease, or ulcerative colitis -drug abuse or addiction -head injury -heart or circulation problems -if you often drink alcohol -kidney disease or problems going to the bathroom -liver disease -lung disease, asthma, or  breathing problems -an unusual or allergic reaction to acetaminophen, oxycodone, other opioid analgesics, other medicines, foods, dyes, or preservatives -pregnant or trying to get pregnant -breast-feeding How should I use this medicine? Take this medicine by mouth with a full glass of water. Follow the directions on the prescription label. Take your medicine at regular intervals. Do not take your medicine more often than directed. Talk to your pediatrician regarding the use of this medicine in children. Special care may be needed. Patients over 52 years old may have a stronger reaction and need a smaller dose. Overdosage: If you think you have taken too much of this medicine contact a poison control center or emergency room at once. NOTE: This medicine is only for you. Do not share this medicine with others. What if I miss a dose? If you miss a dose, take it as soon as you can. If it is almost time for your next dose, take only that dose. Do not take double or extra doses. What may interact with this medicine? -alcohol -antihistamines -barbiturates like amobarbital, butalbital, butabarbital, methohexital, pentobarbital, phenobarbital, thiopental, and secobarbital -benztropine -drugs for bladder problems like solifenacin, trospium, oxybutynin, tolterodine, hyoscyamine, and methscopolamine -drugs for breathing problems like ipratropium and tiotropium -drugs for certain stomach or intestine problems like propantheline, homatropine methylbromide, glycopyrrolate, atropine,  belladonna, and dicyclomine -general anesthetics like etomidate, ketamine, nitrous oxide, propofol, desflurane, enflurane, halothane, isoflurane, and sevoflurane -medicines for depression, anxiety, or psychotic disturbances -medicines for sleep -muscle relaxants -naltrexone -narcotic medicines (opiates) for pain -phenothiazines like perphenazine, thioridazine, chlorpromazine, mesoridazine, fluphenazine, prochlorperazine, promazine, and trifluoperazine -scopolamine -tramadol -trihexyphenidyl This list may not describe all possible interactions. Give your health care provider a list of all the medicines, herbs, non-prescription drugs, or dietary supplements you use. Also tell them if you smoke, drink alcohol, or use illegal drugs. Some items may interact with your medicine. What should I watch for while using this medicine? Tell your doctor or health care professional if your pain does not go away, if it gets worse, or if you have new or a different type of pain. You may develop tolerance to the medicine. Tolerance means that you will need a higher dose of the medication for pain relief. Tolerance is normal and is expected if you take this medicine for a long time. Do not suddenly stop taking your medicine because you may develop a severe reaction. Your body becomes used to the medicine. This does NOT mean you are addicted. Addiction is a behavior related to getting and using a drug for a non-medical reason. If you have pain, you have a medical reason to take pain medicine. Your doctor will tell you how much medicine to take. If your doctor wants you to stop the medicine, the dose will be slowly lowered over time to avoid any side effects. You may get drowsy or dizzy. Do not drive, use machinery, or do anything that needs mental alertness until you know how this medicine affects you. Do not stand or sit up quickly, especially if you are an older patient. This reduces the risk of dizzy or fainting  spells. Alcohol may interfere with the effect of this medicine. Avoid alcoholic drinks. There are different types of narcotic medicines (opiates) for pain. If you take more than one type at the same time, you may have more side effects. Give your health care provider a list of all medicines you use. Your doctor will tell you how much medicine to take. Do not take more medicine than  directed. Call emergency for help if you have problems breathing. The medicine will cause constipation. Try to have a bowel movement at least every 2 to 3 days. If you do not have a bowel movement for 3 days, call your doctor or health care professional. Do not take Tylenol (acetaminophen) or medicines that have acetaminophen with this medicine. Too much acetaminophen can be very dangerous. Many nonprescription medicines contain acetaminophen. Always read the labels carefully to avoid taking more acetaminophen. What side effects may I notice from receiving this medicine? Side effects that you should report to your doctor or health care professional as soon as possible: -allergic reactions like skin rash, itching or hives, swelling of the face, lips, or tongue -breathing difficulties, wheezing -confusion -light headedness or fainting spells -severe stomach pain -unusually weak or tired -yellowing of the skin or the whites of the eyes  Side effects that usually do not require medical attention (report to your doctor or health care professional if they continue or are bothersome): -dizziness -drowsiness -nausea -vomiting This list may not describe all possible side effects. Call your doctor for medical advice about side effects. You may report side effects to FDA at 1-800-FDA-1088. Where should I keep my medicine? Keep out of the reach of children. This medicine can be abused. Keep your medicine in a safe place to protect it from theft. Do not share this medicine with anyone. Selling or giving away this medicine is  dangerous and against the law. Store at room temperature between 20 and 25 degrees C (68 and 77 degrees F). Keep container tightly closed. Protect from light. This medicine may cause accidental overdose and death if it is taken by other adults, children, or pets. Flush any unused medicine down the toilet to reduce the chance of harm. Do not use the medicine after the expiration date. NOTE: This sheet is a summary. It may not cover all possible information. If you have questions about this medicine, talk to your doctor, pharmacist, or health care provider.  2014, [employer] Standard. (2012-12-08 13:17:35)

## 2013-08-24 NOTE — Progress Notes (Signed)
Diabetes Self-Management Education  Visit Number: last visit July 2013. saw patient in room with physician today. She was not very talkative  08/24/2013 Ms. [facility], identified by name and date of birth, is a 23 y.o. female with Type 1 diabetes  .  Other people present during visit: mother      ASSESSMENT  Patient Concerns:     Last menstrual period 07/17/2013. There is no weight on file to calculate BMI.  Lab Results: Hemoglobin A1C  Date Value Ref Range Status  08/07/2013 19.3* <5.7 % Final     (NOTE)                                                                               Family History  Problem Relation Age of Onset  . Anesthesia problems Neg Hx   . Asthma Mother   . Gout Father   . Diabetes Paternal Grandmother    History  Substance Use Topics  . Smoking status: Never Smoker   . Smokeless tobacco: Never Used  . Alcohol Use: No    Support Systems:   mother Special Needs:    no Prior DM Education:  yes- has had since age 81 Daily Foot Exams:  did not ask Patient Belief / Attitude about Diabetes:   did not ask  Assessment comments: agree with counseling. Patient currently using Lantus and Humalog, will need to find long term source of insulin. Patient hesitant to communicate with CDE today. Is keeping pen needles on insulin pen, injects in arm- could be  an IM injection    Individualized Plan for Diabetes Self-Management Training:  Patient not willing to talk today so inappropriate to discuss Self management training plan. Encouraged patient to contact peers with Type 1 diabetes such as diabetes camp.   Education Topics Reviewed with Patient Today:  Topic Points Discussed  Disease State    Nutrition Management    Physical Activity and Exercise    Medications  removing pen needle after injection and not reusing.   Monitoring    Acute Complications    Chronic Complications    Psychosocial Adjustment  agree with counseling to assist her in  coping with chronic disease and supporting herselt  Goal Setting    Preconception Care (if applicable)      PATIENTS GOALS   Learning Objective(s):   past goal from 2013 was to carry meter, insulin and carbs with her at all times. Plan to follow up as appropriate with this goal  Goal The patient agrees to:  Nutrition    Physical Activity    Medications take my medication as prescribed  Monitoring test my blood glucose as discussed 2-4 times a day is reasonable for Type 1 diabetes  Problem Solving    Reducing Risk    Health Coping     Patient Self-Evaluation of Goals (Subsequent Visits)  Goal The patient rates self as meeting goals (% of time)  Nutrition    Physical Activity    Medications    Monitoring    Problem Solving    Reducing Risk    Health Coping       PERSONALIZED PLAN / SUPPORT  Self-Management Support:  [place] office;Family;CDE  visits ______________________________________________________________________  Outcomes  Expected Outcomes:  Demonstrated interest in learning. Expect positive outcomes Self-Care Barriers:  Lack of transportation;Lack of material resources Education material provided: no If problems or questions, patient to contact team via:  Phone and Email Time in: 1000     Time out: 1030  Future DSME appointment: - 2 wks   Chauncey Reading Kelon Easom 08/24/2013 10:00 AM

## 2013-08-24 NOTE — Progress Notes (Signed)
Case discussed with Dr. McLean soon after the resident saw the patient.  We reviewed the resident's history and exam and pertinent patient test results.  I agree with the assessment, diagnosis, and plan of care documented in the resident's note. 

## 2013-08-24 NOTE — Assessment & Plan Note (Addendum)
Lab Results  Component Value Date   HGBA1C 19.3* 08/07/2013   HGBA1C 17.0* 05/16/2013   HGBA1C >14.0 11/14/2012     Assessment: Diabetes control: poor control (HgbA1C >9%) Progress toward A1C goal:  deteriorated Comments: pt is noncompliant with meds but has been more compliant since discharge   Plan: Medications:  continue current medications (lantus 20 units qam), Humalog at least 3 units tid  Home glucose monitoring: Frequency: 3 times a day Timing: before meals Instruction/counseling given: provided printed educational material Educational resources provided: other (see comments) Self management tools provided: other (see comments) Other plans: f/u in 3 months, recently had DKA will check BMET, Mag, phos, CBC today  cbg was low at home this am ed patient doses of medications to take she was taking Lantus 20 units bid instead of qd, Barry Brunner met with patient  Checked urine microalb/cre today  Gave patient information about [facility]-counseling for pt's is chronic illness since patient has poor insight and judgement at times with DM 1 compliance

## 2013-08-24 NOTE — Progress Notes (Signed)
   Subjective:    Patient ID: Ashley Freeman, female    DOB: 04/03/1991, 23 y.o.   MRN: [id number]  HPI Comments: 23 y.o Past Medical History Gonorrhea, Diabetes mellitus Type I (dx'ed age 52 y.o), DKA (diabetic ketoacidoses), right gluteal abscess, anemia, h/o med noncompliance  She presents for: BP 100/68 HR 117  1) HFU for DKA likely 2/2 med noncompliance and right gluteal abscess I&D'ed by Dr. Rosendo Gros.  She c/o pain at gluteal abscess site 7/10 throbbing/stabbing pain which started last night.  She has not taken Percocet b/c she was not given Rx at d/c.  She has taken Tylenol which did not help.   2)DM 1-HA1C 19.3 08/07/13.  She is taking Lantus 20 units bid, Humalog 3-6 units tid.  Her cbg was 72 this am and she felt shaky so she ate 4 graham cracker squares.  cbg currently 306.    HM: will defer pap smear to next visit       Review of Systems  Constitutional: Negative for fever and chills.       Night sweats   Respiratory: Negative for shortness of breath.   Cardiovascular: Negative for palpitations.  Gastrointestinal: Negative for nausea, vomiting and abdominal pain.  Skin: Positive for wound.       Objective:   Physical Exam  Nursing note and vitals reviewed. Constitutional: She is oriented to person, place, and time. She appears well-developed and well-nourished. She is cooperative. No distress.  HENT:  Head: Normocephalic and atraumatic.  Mouth/Throat: Oropharynx is clear and moist and mucous membranes are normal. No oropharyngeal exudate.  Eyes: Conjunctivae are normal. Pupils are equal, round, and reactive to light. Right eye exhibits no discharge. Left eye exhibits no discharge. No scleral icterus.  Cardiovascular: Regular rhythm, S1 normal, S2 normal and normal heart sounds.  Tachycardia present.   No murmur heard. No lower ext edema   Pulmonary/Chest: Effort normal and breath sounds normal. No respiratory distress. She has no wheezes.  Abdominal: Soft. Bowel  sounds are normal. There is no tenderness.  Genitourinary:     Neurological: She is alert and oriented to person, place, and time.  Skin: Skin is warm and dry. No rash noted. She is not diaphoretic.  Psychiatric: She has a normal mood and affect. Her speech is normal and behavior is normal. Judgment and thought content normal. Cognition and memory are normal.          Assessment & Plan:  F/u in 3 months

## 2013-08-24 NOTE — Assessment & Plan Note (Signed)
Still painful but well healing Rx Percocet 5-325 mg q12 hours prn #20 no refills can alternate with Tylenol  Finish course Levaquin  F/u with Dr. Rosendo Gros

## 2013-08-24 NOTE — Discharge Summary (Signed)
  Date: 08/24/2013  Patient name: Ashley Freeman  Medical record number: [id number]  Date of birth: [birth year]   This patient has been seen and the plan of care was discussed with the house staff. Please see their note for complete details. I concur with their findings and plan. Patient met criteria for sepsis on admission, treated with broad spectrum antibiotic therapy and underwent I&D of gluteal abscess.  Dominic Pea, DO, [facility] 08/24/2013, 11:23 AM

## 2013-08-24 NOTE — Assessment & Plan Note (Signed)
Will check pap smear at f/u

## 2013-08-25 LAB — CBC WITH DIFFERENTIAL/PLATELET
Basophils Absolute: 0 10*3/uL (ref 0.0–0.1)
Basophils Relative: 0 % (ref 0–1)
EOS PCT: 1 % (ref 0–5)
Eosinophils Absolute: 0.2 10*3/uL (ref 0.0–0.7)
HEMATOCRIT: 29.3 % — AB (ref 36.0–46.0)
HEMOGLOBIN: 9.2 g/dL — AB (ref 12.0–15.0)
LYMPHS ABS: 2.8 10*3/uL (ref 0.7–4.0)
LYMPHS PCT: 15 % (ref 12–46)
MCH: 27.5 pg (ref 26.0–34.0)
MCHC: 31.4 g/dL (ref 30.0–36.0)
MCV: 87.7 fL (ref 78.0–100.0)
MONOS PCT: 9 % (ref 3–12)
Monocytes Absolute: 1.7 10*3/uL — ABNORMAL HIGH (ref 0.1–1.0)
Neutro Abs: 13.9 10*3/uL — ABNORMAL HIGH (ref 1.7–7.7)
Neutrophils Relative %: 75 % (ref 43–77)
Platelets: 682 10*3/uL — ABNORMAL HIGH (ref 150–400)
RBC: 3.34 MIL/uL — AB (ref 3.87–5.11)
RDW: 14.5 % (ref 11.5–15.5)
WBC: 18.5 10*3/uL — AB (ref 4.0–10.5)

## 2013-08-25 LAB — MICROALBUMIN / CREATININE URINE RATIO
CREATININE, URINE: 107.5 mg/dL
MICROALB/CREAT RATIO: 7.2 mg/g (ref 0.0–30.0)
Microalb, Ur: 0.77 mg/dL (ref 0.00–1.89)

## 2013-08-26 ENCOUNTER — Telehealth: Payer: Self-pay | Admitting: Licensed Clinical Social Worker

## 2013-08-26 ENCOUNTER — Encounter: Payer: Self-pay | Admitting: Internal Medicine

## 2013-08-26 NOTE — Telephone Encounter (Signed)
[name] was referred to CSW for psychology referral in addition, pt will need long term plan for insulin.  Currently, pt has family planning medicaid which does not cover diabetic needs.  Pt will be referred to [facility], [facility].

## 2013-08-31 ENCOUNTER — Telehealth: Payer: Self-pay | Admitting: *Deleted

## 2013-08-31 ENCOUNTER — Telehealth (INDEPENDENT_AMBULATORY_CARE_PROVIDER_SITE_OTHER): Payer: Self-pay

## 2013-08-31 ENCOUNTER — Other Ambulatory Visit: Payer: Self-pay | Admitting: *Deleted

## 2013-08-31 ENCOUNTER — Other Ambulatory Visit: Payer: Self-pay | Admitting: Internal Medicine

## 2013-08-31 DIAGNOSIS — L0231 Cutaneous abscess of buttock: Secondary | ICD-10-CM

## 2013-08-31 MED ORDER — OXYCODONE-ACETAMINOPHEN 5-325 MG PO TABS: 1.0000 | ORAL_TABLET | Freq: Two times a day (BID) | ORAL | Status: DC | PRN

## 2013-08-31 NOTE — Telephone Encounter (Signed)
Ashley Freeman w/[facility] calling in b/c concerned with pt's wound care and being non compliant with her diabetes. The pt is doing daily dressing changes BID to the open wound but pt states there has been green/yellow drainage. The home health nurse states the pt's sugars all over the place with today fasting at 401. The pt is still not taking her insulin on schedule and eating fast food all the time. Today is the last day for home health visit with the pt really being non compliant there is no other service home health can really offer the pt. I did speak to Dr Rosendo Gros and he advised to move the pt's appt up earlier to [facility] but they had nothing earlier. I r/s the appt with Dr Rosendo Gros from 5/20 to 5/15. The home health nurse will notify the pt.

## 2013-08-31 NOTE — Telephone Encounter (Signed)
Call from [facility] with [facility] -  # [phone] Nurse reports this is the first time pt seen.  Fasting BS today was 401, pt checks sugars on and off ranging from 301 - 430.  Nurse states pt is noncompliant with diet and checking sugars.  This is the last visit with pt as she is not interested in there teaching and there services. Wound is draining and HHN called Dr Rosendo Gros and her appointment was moved  from 5/20 to 5/15.  Pt last seen in office for HFU for DKA likely 2/2 med noncompliance and right gluteal abscess.   I&D'ed by Dr. Rosendo Gros

## 2013-09-01 ENCOUNTER — Encounter: Payer: Self-pay | Admitting: Licensed Clinical Social Worker

## 2013-09-01 ENCOUNTER — Telehealth: Payer: Self-pay | Admitting: *Deleted

## 2013-09-01 NOTE — Telephone Encounter (Signed)
Mobile not working, called home ph left message for rtc

## 2013-09-01 NOTE — Telephone Encounter (Signed)
Pt scheduled in clinic for f/u next week.

## 2013-09-01 NOTE — Telephone Encounter (Signed)
Message copied by Gaylyn Rong on Tue Sep 01, 2013  8:44 AM ------      Message from: Cresenciano Genre      Created: Mon Aug 31, 2013  4:51 PM       Please call patient she needs follow up in 1 week            Thanks      TM ------

## 2013-09-01 NOTE — Telephone Encounter (Signed)
CSW placed all to [name] to discuss current needs and resources.  Pt is aware she has family planning medicaid which only covers limited care.  [name] states her plan is to apply for Adult Medicaid.  Pt is currently working with no plans of applying for disability.  CSW informed [name], most likely would not qualify for adult medicaid.  CSW discussed MAP program to help with medication assistance and local community mental health agencies that offer sliding fee scale.  Pt states her next appt at [facility] is not until June.  CSW will mail [name] information, address confirmed.

## 2013-09-03 ENCOUNTER — Telehealth (INDEPENDENT_AMBULATORY_CARE_PROVIDER_SITE_OTHER): Payer: Self-pay

## 2013-09-03 NOTE — Telephone Encounter (Signed)
Juliann Pulse from [facility] called stating they have been waiting on a outstanding physician order signature from Dr Ninfa Linden on pt. Advised her pt surgeon is Dr Rosendo Gros and that Dr Ninfa Linden only saw her while she was in hospital. Advised her to refax  The order and put Dr Rosendo Gros name as MD since he actually did surgery on pt.

## 2013-09-10 ENCOUNTER — Ambulatory Visit: Payer: Self-pay | Admitting: Internal Medicine

## 2013-09-11 ENCOUNTER — Encounter (INDEPENDENT_AMBULATORY_CARE_PROVIDER_SITE_OTHER): Payer: Medicaid Other | Admitting: General Surgery

## 2013-09-15 ENCOUNTER — Emergency Department (HOSPITAL_COMMUNITY)
Admission: EM | Admit: 2013-09-15 | Discharge: 2013-09-15 | Disposition: A | Discharge status: Home / Self Care | Payer: Medicaid Other | Attending: Emergency Medicine | Admitting: Emergency Medicine

## 2013-09-15 ENCOUNTER — Encounter (HOSPITAL_COMMUNITY): Payer: Self-pay | Admitting: Emergency Medicine

## 2013-09-15 DIAGNOSIS — Z794 Long term (current) use of insulin: Secondary | ICD-10-CM | POA: Insufficient documentation

## 2013-09-15 DIAGNOSIS — Z880 Allergy status to penicillin: Secondary | ICD-10-CM | POA: Insufficient documentation

## 2013-09-15 DIAGNOSIS — A599 Trichomoniasis, unspecified: Secondary | ICD-10-CM

## 2013-09-15 DIAGNOSIS — Z9889 Other specified postprocedural states: Secondary | ICD-10-CM | POA: Insufficient documentation

## 2013-09-15 DIAGNOSIS — N739 Female pelvic inflammatory disease, unspecified: Secondary | ICD-10-CM | POA: Insufficient documentation

## 2013-09-15 DIAGNOSIS — E1065 Type 1 diabetes mellitus with hyperglycemia: Secondary | ICD-10-CM | POA: Insufficient documentation

## 2013-09-15 DIAGNOSIS — IMO0002 Reserved for concepts with insufficient information to code with codable children: Secondary | ICD-10-CM | POA: Insufficient documentation

## 2013-09-15 DIAGNOSIS — N760 Acute vaginitis: Secondary | ICD-10-CM

## 2013-09-15 DIAGNOSIS — Z792 Long term (current) use of antibiotics: Secondary | ICD-10-CM | POA: Insufficient documentation

## 2013-09-15 DIAGNOSIS — A5901 Trichomonal vulvovaginitis: Secondary | ICD-10-CM | POA: Insufficient documentation

## 2013-09-15 LAB — WET PREP, GENITAL: Yeast Wet Prep HPF POC: NONE SEEN

## 2013-09-15 MED ORDER — HYDROCODONE-ACETAMINOPHEN 5-325 MG PO TABS: 2.0000 | ORAL_TABLET | ORAL | Status: DC | PRN

## 2013-09-15 MED ORDER — HYDROCODONE-ACETAMINOPHEN 5-325 MG PO TABS
2.0000 | ORAL_TABLET | Freq: Once | ORAL | Status: AC
  Administered 2013-09-15: 2 via ORAL
  Filled 2013-09-15: qty 2

## 2013-09-15 MED ORDER — METRONIDAZOLE 500 MG PO TABS
2000.0000 mg | ORAL_TABLET | Freq: Once | ORAL | Status: AC
  Administered 2013-09-15: 2000 mg via ORAL
  Filled 2013-09-15: qty 4

## 2013-09-15 MED ORDER — ONDANSETRON 4 MG PO TBDP
4.0000 mg | ORAL_TABLET | Freq: Once | ORAL | Status: AC
  Administered 2013-09-15: 4 mg via ORAL
  Filled 2013-09-15: qty 1

## 2013-09-15 NOTE — Discharge Instructions (Signed)
Bacterial Vaginosis Bacterial vaginosis is an infection of the vagina. It happens when too many of certain germs (bacteria) grow in the vagina. HOME CARE  Take your medicine as told by your doctor.  Finish your medicine even if you start to feel better.  Do not have sex until you finish your medicine and are better.  Tell your sex partner that you have an infection. They should see their doctor for treatment.  Practice safe sex. Use condoms. Have only one sex partner. GET HELP IF:  You are not getting better after 3 days of treatment.  You have more grey fluid (discharge) coming from your vagina than before.  You have more pain than before.  You have a fever. MAKE SURE YOU:   Understand these instructions.  Will watch your condition.  Will get help right away if you are not doing well or get worse. Document Released: 01/24/2008 Document Revised: 02/04/2013 Document Reviewed: 11/26/2012 ExitCare Patient Information 2014 [employer].  

## 2013-09-15 NOTE — ED Provider Notes (Signed)
CSN: [id number]     Arrival date & time 09/15/13  1347 History   First MD Initiated Contact with Patient 09/15/13 1506     Chief Complaint  Patient presents with  . Abscess     (Consider location/radiation/quality/duration/timing/severity/associated sxs/prior Treatment) HPI Pt is a 23yo female with hx of type 1 diabetes (uncontrolled) presenting today c/o right sided vaginal abscess that started 2 days ago after pt shaved. Pain is constant, sharp and burning worse with touch, 8/10. States she had surgery for a rectal abscess on 4/21 by Dr. Rosendo Gros, general surgery. Pt has f/u appointment tomorrow at [facility] she was initially on antibiotics after surgery but completed them about 4 days after being discharged from the hospital on 4/24.  Pt states that seems to be healing well. She states it no longer hurts, she continues to change the bandages twice daily and states it is now so small it does not require packing, as packing falls out as soon as it is placed.  Denies fever, n/v/d.   Past Medical History  Diagnosis Date  . Gonorrhea 08/2011    Treated in 09/2011  . Diabetes mellitus 2001    Diagnosed at age 57 ; Type I  . DKA (diabetic ketoacidoses) 08/19/2013   Past Surgical History  Procedure Laterality Date  . No past surgeries    . Incision and drainage perirectal abscess Right 08/18/2013    Procedure: IRRIGATION AND DEBRIDEMENT GLUTEAL ABSCESS;  Surgeon: Ralene Ok, MD;  Location: [place];  Service: General;  Laterality: Right;   Family History  Problem Relation Age of Onset  . Anesthesia problems Neg Hx   . Asthma Mother   . Gout Father   . Diabetes Paternal Grandmother    History  Substance Use Topics  . Smoking status: Never Smoker   . Smokeless tobacco: Never Used  . Alcohol Use: No   OB History   Grav Para Term Preterm Abortions TAB SAB Ect Mult Living   1 0   1  1   0     Review of Systems  Constitutional: Negative for fever and chills.  Genitourinary:  Positive for vaginal bleeding, vaginal discharge, genital sores (right labial abscess) and vaginal pain. Negative for dysuria, hematuria, flank pain, decreased urine volume, menstrual problem and pelvic pain.  Skin: Positive for wound.  All other systems reviewed and are negative.     Allergies  Penicillins  Home Medications   Prior to Admission medications   Medication Sig Start Date End Date Taking? Authorizing Provider  insulin glargine (LANTUS) 100 unit/mL SOPN Inject 0.2 mLs (20 Units total) into the skin every morning. 08/21/13   Corky Sox, MD  insulin lispro (HUMALOG KWIKPEN) 100 UNIT/ML KiwkPen Inject 0.03 mLs (3 Units total) into the skin 3 (three) times daily. 08/21/13   Corky Sox, MD  Insulin Pen Needle 32G X 4 MM MISC For use with Lantus and Novolog insulin pens 4 times daily, in the morning and with meals 08/21/13   Corky Sox, MD  levofloxacin (LEVAQUIN) 500 MG tablet Take 1 tablet (500 mg total) by mouth daily. 08/21/13   Corky Sox, MD  oxyCODONE-acetaminophen (PERCOCET/ROXICET) 5-325 MG per tablet Take 1-2 tablets by mouth every 12 (twelve) hours as needed for severe pain. 08/31/13   Cresenciano Genre, MD   BP 129/89  Pulse 98  Temp(Src) 98.6 F (37 C) (Oral)  Resp 16  Ht 5\' 4"  (1.626 m)  Wt 116 lb (52.617  kg)  BMI 19.90 kg/m2  SpO2 100%  LMP 08/24/2013 Physical Exam  Nursing note and vitals reviewed. Constitutional: She is oriented to person, place, and time. She appears well-developed and well-nourished. No distress.  Pt appears well, non-toxic. NAD  HENT:  Head: Normocephalic and atraumatic.  Eyes: Conjunctivae are normal. No scleral icterus.  Neck: Normal range of motion.  Cardiovascular: Normal rate, regular rhythm and normal heart sounds.   Pulmonary/Chest: Effort normal and breath sounds normal. No respiratory distress. She has no wheezes. She has no rales. She exhibits no tenderness.  Abdominal: Soft. Bowel sounds are normal. She exhibits no  distension and no mass. There is no tenderness. There is no rebound and no guarding.  Soft, non-distended. Non-tender.  Genitourinary:  Chaperoned exam. External-abscess on right labia majora, erythema, edema, and tenderness with induration. No discharge from abscess.  Vaginal discharge-moderate mount, white and thick.     Right buttock: well healing surgical site with granulation tissue present. Small amount of purulent drainage still present.  Not deep enough for packing at this time.  Musculoskeletal: Normal range of motion.  Neurological: She is alert and oriented to person, place, and time.  Skin: Skin is warm and dry. She is not diaphoretic.    ED Course  Procedures   INCISION AND DRAINAGE Performed by: Noland Fordyce Consent: Verbal consent obtained. Risks and benefits: risks, benefits and alternatives were discussed Type: abscess  Body area: right labia majora  Anesthesia: local infiltration  Incision was made with a scalpel.  Local anesthetic: lidocaine 2% without epinephrine  Anesthetic total: 69ml  Complexity: complex Blunt dissection to break up loculations  Drainage: bloody  Drainage amount: scant  Packing material: covered with guaze  Patient tolerance: Patient tolerated the procedure well with no immediate complications.     Labs Review Labs Reviewed  WET PREP, GENITAL - Abnormal; Notable for the following:    Trich, Wet Prep MODERATE (*)    Clue Cells Wet Prep HPF POC FEW (*)    WBC, Wet Prep HPF POC MODERATE (*)    All other components within normal limits  GC/CHLAMYDIA PROBE AMP    Imaging Review No results found.   EKG Interpretation None      MDM   Final diagnoses:  Abscess, vagina  Trichomoniasis    pt presenting with abscess to right labia majora.  I&D performed, see procedure note above.  Wound from rectal abscess surgery on 4/21 appears to be healing well. Bandage changed.  Pt also found to have trichomoniasis.  WIll tx with  flagyl. GC/chlamydia swabs sent.  Advised to f/u as scheduled with her general surgeon tomorrow, 5/20 at [facility] for wound recheck.  Also advised to f/u in 1 week for recheck of trichomoniasis with PCP.  Return precautions provided. Pt verbalized understanding and agreement with tx plan.     Noland Fordyce, PA-C 09/16/13 [phone]

## 2013-09-15 NOTE — ED Notes (Signed)
Reports having vaginal abscess that is causing severe pain. Recently had surgery for rectal abscess.

## 2013-09-16 ENCOUNTER — Encounter (INDEPENDENT_AMBULATORY_CARE_PROVIDER_SITE_OTHER): Payer: Medicaid Other | Admitting: General Surgery

## 2013-09-16 LAB — GC/CHLAMYDIA PROBE AMP
CT Probe RNA: NEGATIVE
GC Probe RNA: NEGATIVE

## 2013-09-16 NOTE — ED Provider Notes (Signed)
Medical screening examination/treatment/procedure(s) were conducted as a shared visit with non-physician practitioner(s) and myself.  I personally evaluated the patient during the encounter.   EKG Interpretation None      I interviewed and examined the patient. Lungs are CTAB. Cardiac exam wnl. Abdomen soft.  Mild erythema and swelling of right labia majora on exam. Right buttock abscess shows continued purulent drainage. Pt has f/u w/ surgeon tomorrow. Will recommend repeat eval by her surgeon.   Blanchard Kelch, MD 09/16/13 2124

## 2013-09-17 ENCOUNTER — Encounter (INDEPENDENT_AMBULATORY_CARE_PROVIDER_SITE_OTHER): Payer: Self-pay | Admitting: General Surgery

## 2013-09-19 ENCOUNTER — Encounter (HOSPITAL_COMMUNITY)
Admission: EM | Disposition: A | Discharge status: Home / Self Care | Payer: Self-pay | Source: Home / Self Care | Attending: Oncology

## 2013-09-19 ENCOUNTER — Encounter (HOSPITAL_COMMUNITY): Payer: Self-pay | Admitting: Emergency Medicine

## 2013-09-19 ENCOUNTER — Inpatient Hospital Stay (HOSPITAL_COMMUNITY)
Admission: EM | Admit: 2013-09-19 | Discharge: 2013-09-29 | DRG: 853 | Disposition: A | Discharge status: Home Health | Payer: Medicaid Other | Attending: Internal Medicine | Admitting: Internal Medicine

## 2013-09-19 DIAGNOSIS — N751 Abscess of Bartholin's gland: Secondary | ICD-10-CM

## 2013-09-19 DIAGNOSIS — A419 Sepsis, unspecified organism: Secondary | ICD-10-CM

## 2013-09-19 DIAGNOSIS — E1010 Type 1 diabetes mellitus with ketoacidosis without coma: Secondary | ICD-10-CM | POA: Diagnosis present

## 2013-09-19 DIAGNOSIS — E1110 Type 2 diabetes mellitus with ketoacidosis without coma: Secondary | ICD-10-CM

## 2013-09-19 DIAGNOSIS — N764 Abscess of vulva: Secondary | ICD-10-CM

## 2013-09-19 DIAGNOSIS — E1065 Type 1 diabetes mellitus with hyperglycemia: Secondary | ICD-10-CM | POA: Diagnosis present

## 2013-09-19 DIAGNOSIS — L02219 Cutaneous abscess of trunk, unspecified: Secondary | ICD-10-CM

## 2013-09-19 DIAGNOSIS — Z9119 Patient's noncompliance with other medical treatment and regimen: Secondary | ICD-10-CM

## 2013-09-19 DIAGNOSIS — Z794 Long term (current) use of insulin: Secondary | ICD-10-CM

## 2013-09-19 DIAGNOSIS — R739 Hyperglycemia, unspecified: Secondary | ICD-10-CM | POA: Diagnosis present

## 2013-09-19 DIAGNOSIS — N750 Cyst of Bartholin's gland: Secondary | ICD-10-CM

## 2013-09-19 DIAGNOSIS — Z880 Allergy status to penicillin: Secondary | ICD-10-CM

## 2013-09-19 DIAGNOSIS — D649 Anemia, unspecified: Secondary | ICD-10-CM

## 2013-09-19 DIAGNOSIS — K612 Anorectal abscess: Secondary | ICD-10-CM | POA: Diagnosis present

## 2013-09-19 DIAGNOSIS — Z833 Family history of diabetes mellitus: Secondary | ICD-10-CM

## 2013-09-19 DIAGNOSIS — N390 Urinary tract infection, site not specified: Secondary | ICD-10-CM | POA: Diagnosis present

## 2013-09-19 DIAGNOSIS — L0231 Cutaneous abscess of buttock: Secondary | ICD-10-CM

## 2013-09-19 DIAGNOSIS — Z91199 Patient's noncompliance with other medical treatment and regimen due to unspecified reason: Secondary | ICD-10-CM

## 2013-09-19 DIAGNOSIS — L03319 Cellulitis of trunk, unspecified: Secondary | ICD-10-CM

## 2013-09-19 DIAGNOSIS — Z91148 Patient's other noncompliance with medication regimen for other reason: Secondary | ICD-10-CM

## 2013-09-19 DIAGNOSIS — E109 Type 1 diabetes mellitus without complications: Secondary | ICD-10-CM

## 2013-09-19 DIAGNOSIS — Z9114 Patient's other noncompliance with medication regimen: Secondary | ICD-10-CM

## 2013-09-19 DIAGNOSIS — E108 Type 1 diabetes mellitus with unspecified complications: Secondary | ICD-10-CM

## 2013-09-19 HISTORY — DX: Abscess of Bartholin's gland: N75.1

## 2013-09-19 HISTORY — DX: Sepsis, unspecified organism: A41.9

## 2013-09-19 HISTORY — PX: INCISION AND DRAINAGE PERIRECTAL ABSCESS: SHX1804

## 2013-09-19 LAB — BASIC METABOLIC PANEL
BUN: 9 mg/dL (ref 6–23)
BUN: 6 mg/dL (ref 6–23)
CO2: 19 mEq/L (ref 19–32)
CO2: 21 meq/L (ref 19–32)
Calcium: 8.5 mg/dL (ref 8.4–10.5)
Calcium: 8.5 mg/dL (ref 8.4–10.5)
Chloride: 90 mEq/L — ABNORMAL LOW (ref 96–112)
Chloride: 97 mEq/L (ref 96–112)
Creatinine, Ser: 0.58 mg/dL (ref 0.50–1.10)
Creatinine, Ser: 0.57 mg/dL (ref 0.50–1.10)
GFR calc Af Amer: >90 mL/min (ref >90)
GFR calc non Af Amer: >90 mL/min (ref >90)
GLUCOSE: 705 mg/dL — AB (ref 70–99)
Glucose, Bld: 324 mg/dL — ABNORMAL HIGH (ref 70–99)
POTASSIUM: 3.7 meq/L (ref 3.7–5.3)
Potassium: 3.9 mEq/L (ref 3.7–5.3)
SODIUM: 132 meq/L — AB (ref 137–147)
Sodium: 128 mEq/L — ABNORMAL LOW (ref 137–147)

## 2013-09-19 LAB — HEPATIC FUNCTION PANEL
ALBUMIN: 2.7 g/dL — AB (ref 3.5–5.2)
ALT: <5 U/L (ref 0–35)
AST: 10 U/L (ref 0–37)
Alkaline Phosphatase: 138 U/L — ABNORMAL HIGH (ref 39–117)
BILIRUBIN TOTAL: 0.2 mg/dL — AB (ref 0.3–1.2)
Bilirubin, Direct: <0.2 mg/dL (ref 0.0–0.3)
Total Protein: 6.6 g/dL (ref 6.0–8.3)

## 2013-09-19 LAB — CBC WITH DIFFERENTIAL/PLATELET
Basophils Absolute: 0 10*3/uL (ref 0.0–0.1)
Basophils Relative: 0 % (ref 0–1)
EOS ABS: 0.1 10*3/uL (ref 0.0–0.7)
EOS PCT: 0 % (ref 0–5)
HCT: 26.6 % — ABNORMAL LOW (ref 36.0–46.0)
HEMOGLOBIN: 9 g/dL — AB (ref 12.0–15.0)
Lymphocytes Relative: 9 % — ABNORMAL LOW (ref 12–46)
Lymphs Abs: 1.8 10*3/uL (ref 0.7–4.0)
MCH: 26.5 pg (ref 26.0–34.0)
MCHC: 33.8 g/dL (ref 30.0–36.0)
MCV: 78.5 fL (ref 78.0–100.0)
MONOS PCT: 6 % (ref 3–12)
Monocytes Absolute: 1.2 10*3/uL — ABNORMAL HIGH (ref 0.1–1.0)
Neutro Abs: 16.2 10*3/uL — ABNORMAL HIGH (ref 1.7–7.7)
Neutrophils Relative %: 85 % — ABNORMAL HIGH (ref 43–77)
Platelets: 488 10*3/uL — ABNORMAL HIGH (ref 150–400)
RBC: 3.39 MIL/uL — ABNORMAL LOW (ref 3.87–5.11)
RDW: 13.6 % (ref 11.5–15.5)
WBC: 19.3 10*3/uL — ABNORMAL HIGH (ref 4.0–10.5)

## 2013-09-19 LAB — URINALYSIS, ROUTINE W REFLEX MICROSCOPIC
Bilirubin Urine: NEGATIVE
Glucose, UA: >1000 mg/dL — AB
Ketones, ur: 15 mg/dL — AB
Nitrite: NEGATIVE
PROTEIN: NEGATIVE mg/dL
Specific Gravity, Urine: 1.036 — ABNORMAL HIGH (ref 1.005–1.030)
Urobilinogen, UA: 0.2 mg/dL (ref 0.0–1.0)
pH: 6 (ref 5.0–8.0)

## 2013-09-19 LAB — URINE MICROSCOPIC-ADD ON

## 2013-09-19 LAB — HCG, SERUM, QUALITATIVE: Preg, Serum: NEGATIVE

## 2013-09-19 LAB — MAGNESIUM: MAGNESIUM: 1.7 mg/dL (ref 1.5–2.5)

## 2013-09-19 LAB — LACTIC ACID, PLASMA: LACTIC ACID, VENOUS: 2.9 mmol/L — AB (ref 0.5–2.2)

## 2013-09-19 LAB — CBG MONITORING, ED
GLUCOSE-CAPILLARY: 414 mg/dL — AB (ref 70–99)
Glucose-Capillary: >600 mg/dL (ref 70–99)
Glucose-Capillary: >600 mg/dL (ref 70–99)

## 2013-09-19 LAB — PHOSPHORUS: Phosphorus: 2.2 mg/dL — ABNORMAL LOW (ref 2.3–4.6)

## 2013-09-19 LAB — MRSA PCR SCREENING: MRSA by PCR: NEGATIVE

## 2013-09-19 SURGERY — INCISION AND DRAINAGE, ABSCESS, PERIRECTAL
Anesthesia: General | Site: Buttocks | Laterality: Right

## 2013-09-19 MED ORDER — ROCURONIUM BROMIDE 50 MG/5ML IV SOLN
INTRAVENOUS | Status: AC
  Filled 2013-09-19: qty 1

## 2013-09-19 MED ORDER — CLINDAMYCIN PHOSPHATE 600 MG/50ML IV SOLN
600.0000 mg | Freq: Once | INTRAVENOUS | Status: AC
  Administered 2013-09-19: 600 mg via INTRAVENOUS
  Filled 2013-09-19: qty 50

## 2013-09-19 MED ORDER — DEXTROSE 5 % IV SOLN
1.0000 g | Freq: Two times a day (BID) | INTRAVENOUS | Status: DC
  Administered 2013-09-19 – 2013-09-28 (×18): 1 g via INTRAVENOUS
  Filled 2013-09-19 (×19): qty 1

## 2013-09-19 MED ORDER — SODIUM CHLORIDE 0.9 % IV BOLUS (SEPSIS)
1000.0000 mL | Freq: Once | INTRAVENOUS | Status: AC
  Administered 2013-09-19: 1000 mL via INTRAVENOUS

## 2013-09-19 MED ORDER — SODIUM CHLORIDE 0.9 % IV SOLN
1000.0000 mL | Freq: Once | INTRAVENOUS | Status: AC
  Administered 2013-09-19: 1000 mL via INTRAVENOUS

## 2013-09-19 MED ORDER — OXYCODONE-ACETAMINOPHEN 5-325 MG PO TABS: 2.0000 | ORAL_TABLET | ORAL | Status: DC | PRN

## 2013-09-19 MED ORDER — DEXTROSE-NACL 5-0.45 % IV SOLN
INTRAVENOUS | Status: DC
  Administered 2013-09-20: 02:00:00 via INTRAVENOUS

## 2013-09-19 MED ORDER — DIPHENHYDRAMINE HCL 50 MG/ML IJ SOLN
25.0000 mg | Freq: Once | INTRAMUSCULAR | Status: AC
  Administered 2013-09-19: 25 mg via INTRAVENOUS
  Filled 2013-09-19: qty 1

## 2013-09-19 MED ORDER — ONDANSETRON HCL 4 MG/2ML IJ SOLN
4.0000 mg | Freq: Four times a day (QID) | INTRAMUSCULAR | Status: DC | PRN
  Filled 2013-09-19: qty 2

## 2013-09-19 MED ORDER — SODIUM CHLORIDE 0.9 % IV BOLUS (SEPSIS)
1000.0000 mL | Freq: Once | INTRAVENOUS | Status: AC
  Administered 2013-09-20: 1000 mL via INTRAVENOUS

## 2013-09-19 MED ORDER — INSULIN ASPART 100 UNIT/ML IV SOLN
10.0000 [IU] | Freq: Once | INTRAVENOUS | Status: AC
  Administered 2013-09-19: 10 [IU] via INTRAVENOUS
  Filled 2013-09-19: qty 0.1

## 2013-09-19 MED ORDER — SODIUM CHLORIDE 0.9 % IJ SOLN: 3.0000 mL | Freq: Two times a day (BID) | INTRAMUSCULAR | Status: DC

## 2013-09-19 MED ORDER — SODIUM CHLORIDE 0.9 % IV SOLN: 250.0000 mL | INTRAVENOUS | Status: DC | PRN

## 2013-09-19 MED ORDER — CLINDAMYCIN HCL 150 MG PO CAPS
150.0000 mg | ORAL_CAPSULE | Freq: Four times a day (QID) | ORAL | Status: DC
Start: 2013-09-19 — End: 2013-12-26

## 2013-09-19 MED ORDER — POTASSIUM CHLORIDE 10 MEQ/100ML IV SOLN
10.0000 meq | INTRAVENOUS | Status: AC
  Administered 2013-09-19: 10 meq via INTRAVENOUS
  Filled 2013-09-19: qty 100

## 2013-09-19 MED ORDER — ONDANSETRON HCL 4 MG/2ML IJ SOLN
4.0000 mg | Freq: Once | INTRAMUSCULAR | Status: AC
  Administered 2013-09-19: 4 mg via INTRAVENOUS
  Filled 2013-09-19: qty 2

## 2013-09-19 MED ORDER — DEXTROSE-NACL 5-0.45 % IV SOLN: INTRAVENOUS | Status: DC

## 2013-09-19 MED ORDER — SODIUM CHLORIDE 0.9 % IV SOLN
INTRAVENOUS | Status: DC
  Administered 2013-09-19: 5.4 [IU]/h via INTRAVENOUS
  Filled 2013-09-19 (×2): qty 1

## 2013-09-19 MED ORDER — SODIUM CHLORIDE 0.9 % IJ SOLN: 3.0000 mL | INTRAMUSCULAR | Status: DC | PRN

## 2013-09-19 MED ORDER — MIDAZOLAM HCL 2 MG/2ML IJ SOLN
INTRAMUSCULAR | Status: AC
  Filled 2013-09-19: qty 2

## 2013-09-19 MED ORDER — MORPHINE SULFATE 4 MG/ML IJ SOLN
4.0000 mg | Freq: Once | INTRAMUSCULAR | Status: AC
  Administered 2013-09-19: 4 mg via INTRAVENOUS
  Filled 2013-09-19: qty 1

## 2013-09-19 MED ORDER — SODIUM CHLORIDE 0.9 % IV SOLN
INTRAVENOUS | Status: AC
  Administered 2013-09-19: 4.5 [IU] via INTRAVENOUS
  Filled 2013-09-19: qty 1

## 2013-09-19 MED ORDER — MORPHINE SULFATE 2 MG/ML IJ SOLN
2.0000 mg | INTRAMUSCULAR | Status: DC | PRN
  Administered 2013-09-21 – 2013-09-28 (×13): 2 mg via INTRAVENOUS
  Filled 2013-09-19 (×14): qty 1

## 2013-09-19 MED ORDER — LIDOCAINE HCL (CARDIAC) 20 MG/ML IV SOLN
INTRAVENOUS | Status: AC
  Filled 2013-09-19: qty 5

## 2013-09-19 MED ORDER — OXYCODONE-ACETAMINOPHEN 5-325 MG PO TABS
1.0000 | ORAL_TABLET | Freq: Once | ORAL | Status: AC
  Administered 2013-09-19: 1 via ORAL
  Filled 2013-09-19: qty 1

## 2013-09-19 MED ORDER — SUCCINYLCHOLINE CHLORIDE 20 MG/ML IJ SOLN
INTRAMUSCULAR | Status: AC
  Filled 2013-09-19: qty 1

## 2013-09-19 MED ORDER — INSULIN GLARGINE 100 UNIT/ML ~~LOC~~ SOLN
20.0000 [IU] | Freq: Once | SUBCUTANEOUS | Status: DC
  Filled 2013-09-19: qty 0.2

## 2013-09-19 MED ORDER — DEXTROSE 50 % IV SOLN: 25.0000 mL | INTRAVENOUS | Status: DC | PRN

## 2013-09-19 MED ORDER — SODIUM CHLORIDE 0.9 % IV BOLUS (SEPSIS): 1000.0000 mL | Freq: Once | INTRAVENOUS | Status: DC

## 2013-09-19 MED ORDER — SODIUM CHLORIDE 0.9 % IV SOLN
1000.0000 mL | INTRAVENOUS | Status: DC
  Administered 2013-09-19: via INTRAVENOUS
  Administered 2013-09-20: 1000 mL via INTRAVENOUS
  Administered 2013-09-20: 01:00:00 via INTRAVENOUS
  Administered 2013-09-20 – 2013-09-24 (×7): 1000 mL via INTRAVENOUS

## 2013-09-19 MED ORDER — PROPOFOL 10 MG/ML IV BOLUS
INTRAVENOUS | Status: AC
  Filled 2013-09-19: qty 20

## 2013-09-19 MED ORDER — SODIUM CHLORIDE 0.9 % IV SOLN
INTRAVENOUS | Status: DC
  Administered 2013-09-19: via INTRAVENOUS

## 2013-09-19 MED ORDER — FENTANYL CITRATE 0.05 MG/ML IJ SOLN
INTRAMUSCULAR | Status: AC
  Filled 2013-09-19: qty 5

## 2013-09-19 MED ORDER — ARTIFICIAL TEARS OP OINT
TOPICAL_OINTMENT | OPHTHALMIC | Status: AC
  Filled 2013-09-19: qty 3.5

## 2013-09-19 MED ORDER — VANCOMYCIN HCL 500 MG IV SOLR
500.0000 mg | Freq: Three times a day (TID) | INTRAVENOUS | Status: DC
  Administered 2013-09-19 – 2013-09-21 (×5): 500 mg via INTRAVENOUS
  Filled 2013-09-19 (×8): qty 500

## 2013-09-19 SURGICAL SUPPLY — 38 items
CANISTER SUCTION 2500CC (MISCELLANEOUS) ×3 IMPLANT
COVER SURGICAL LIGHT HANDLE (MISCELLANEOUS) ×3 IMPLANT
DRAPE UTILITY 15X26 W/TAPE STR (DRAPE) ×6 IMPLANT
DRSG PAD ABDOMINAL 8X10 ST (GAUZE/BANDAGES/DRESSINGS) ×6 IMPLANT
ELECT CAUTERY BLADE 6.4 (BLADE) IMPLANT
ELECT REM PT RETURN 9FT ADLT (ELECTROSURGICAL) ×3
ELECTRODE REM PT RTRN 9FT ADLT (ELECTROSURGICAL) ×1 IMPLANT
GAUZE PACKING IODOFORM 1/2 (PACKING) ×3 IMPLANT
GLOVE BIO SURGEON STRL SZ7.5 (GLOVE) ×3 IMPLANT
GLOVE BIOGEL PI IND STRL 7.5 (GLOVE) ×1 IMPLANT
GLOVE BIOGEL PI IND STRL 8 (GLOVE) ×1 IMPLANT
GLOVE BIOGEL PI INDICATOR 7.5 (GLOVE) ×2
GLOVE BIOGEL PI INDICATOR 8 (GLOVE) ×2
GOWN STRL REUS W/ TWL LRG LVL3 (GOWN DISPOSABLE) ×1 IMPLANT
GOWN STRL REUS W/ TWL XL LVL3 (GOWN DISPOSABLE) ×1 IMPLANT
GOWN STRL REUS W/TWL LRG LVL3 (GOWN DISPOSABLE) ×2
GOWN STRL REUS W/TWL XL LVL3 (GOWN DISPOSABLE) ×2
KIT BASIN OR (CUSTOM PROCEDURE TRAY) ×3 IMPLANT
KIT ROOM TURNOVER OR (KITS) ×3 IMPLANT
NEEDLE HYPO 25GX1X1/2 BEV (NEEDLE) ×3 IMPLANT
NS IRRIG 1000ML POUR BTL (IV SOLUTION) ×3 IMPLANT
PACK GENERAL/GYN (CUSTOM PROCEDURE TRAY) ×3 IMPLANT
PACK LITHOTOMY IV (CUSTOM PROCEDURE TRAY) IMPLANT
PAD ABD 8X10 STRL (GAUZE/BANDAGES/DRESSINGS) ×3 IMPLANT
PAD ARMBOARD 7.5X6 YLW CONV (MISCELLANEOUS) ×3 IMPLANT
PENCIL BUTTON HOLSTER BLD 10FT (ELECTRODE) IMPLANT
SPONGE GAUZE 4X4 12PLY (GAUZE/BANDAGES/DRESSINGS) ×3 IMPLANT
SPONGE GAUZE 4X4 12PLY STER LF (GAUZE/BANDAGES/DRESSINGS) ×3 IMPLANT
SPONGE LAP 18X18 X RAY DECT (DISPOSABLE) IMPLANT
SWAB COLLECTION DEVICE MRSA (MISCELLANEOUS) ×3 IMPLANT
SYR CONTROL 10ML LL (SYRINGE) ×3 IMPLANT
TOWEL OR 17X24 6PK STRL BLUE (TOWEL DISPOSABLE) ×3 IMPLANT
TOWEL OR 17X26 10 PK STRL BLUE (TOWEL DISPOSABLE) ×3 IMPLANT
TUBE ANAEROBIC SPECIMEN COL (MISCELLANEOUS) ×3 IMPLANT
TUBE CONNECTING 12'X1/4 (SUCTIONS)
TUBE CONNECTING 12X1/4 (SUCTIONS) IMPLANT
UNDERPAD 30X30 INCONTINENT (UNDERPADS AND DIAPERS) ×3 IMPLANT
YANKAUER SUCT BULB TIP NO VENT (SUCTIONS) IMPLANT

## 2013-09-19 NOTE — ED Notes (Signed)
healinng perirectal abscess that is draining. Sm. Amts. Of green drainage. There is drainage catheter in place. Also, has developed another abscess on rt. Side of vagina. -

## 2013-09-19 NOTE — H&P (Signed)
Date: 09/19/2013               Patient Name:  Ashley Freeman MRN: [id number]  DOB: [birth year] Age / Sex: 23 y.o., female   PCP: Cresenciano Genre, MD         Medical Service: Internal Medicine Teaching Service         Attending Physician: Dr. Axel Filler, MD    First Contact: Dr. Redmond Pulling Pager: [phone]  Second Contact: Dr. Algis Liming Pager: [phone]       After Hours (After 5p/  First Contact Pager: [phone]  weekends / holidays): Second Contact Pager: [phone]   Chief Complaint: vaginal abscess  History of Present Illness:  [name] is 23 year old woman with history of DM1 (A1C 19.3% in 07/2013) and medication noncompliance who presents with extensive perineal abscesses, DKA.  Patient states she noticed a large bump in her vaginal area 2 days ago after shaving the area and came to ED at that time to have it drained.  However, the bump did not resolve after and has been increasing in size since so she returned to the ED to have it drained again.  The area is quite painful but has not been draining at home.  She has been taking Aleve without relief.  Of note, she is also s/p I&D (multiple organisms, none predominant) by Dr. Rosendo Gros of a right gluteal abscess during 07/2012 admission but feels that this area is healed.  Denies fever.    In terms of her diabetes, patient states she has been much more compliant with her insulin regimen since last hospital discharge.  She has been taking Lantus 20 units BID (though it is only prescribed daily), Humalog 6 units TID with meals (though it is prescribed 3 units daily).  Denies chest pain, abdominal pain, nausea/vomiting, diarrhea, polyuria, polydipsia, dysuria, weakness, or syncope.  She has moved in with her mother which is a much better living arrangement for her than previously; she continues to work at [employer].   In ED, patient afebrile but with leucocytosis to 19 and large right Bartholin gland abscess on exam; now s/p I&D in ED with  placement of Wert catheter.  She was also found to be in DKA with glucose 705, AG 19, bicarb 19, K 3.7, small ketonuria; lactic acid also mildly elevated to 2.9.  She was started on IV clindamycin, DKA protocol, and admitted to IMTS for further management.    Meds: Current Facility-Administered Medications  Medication Dose Route Frequency Provider Last Rate Last Dose  . 0.9 %  sodium chloride infusion  1,000 mL Intravenous Once [employer], PA-C       Followed by  . 0.9 %  sodium chloride infusion  1,000 mL Intravenous Continuous Nicole Pisciotta, PA-C      . dextrose 5 %-0.45 % sodium chloride infusion   Intravenous Continuous Nicole Pisciotta, PA-C      . diphenhydrAMINE (BENADRYL) injection 25 mg  25 mg Intravenous Once Neta Ehlers, MD      . insulin regular (NOVOLIN R,HUMULIN R) 1 Units/mL in sodium chloride 0.9 % 100 mL infusion   Intravenous Continuous Monico Blitz, PA-C       Current Outpatient Prescriptions  Medication Sig Dispense Refill  . insulin glargine (LANTUS) 100 unit/mL SOPN Inject 0.2 mLs (20 Units total) into the skin every morning.  15 mL  11  . insulin lispro (HUMALOG KWIKPEN) 100 UNIT/ML KiwkPen Inject 0.03 mLs (3 Units total) into the  skin 3 (three) times daily.  15 mL  11  . clindamycin (CLEOCIN) 150 MG capsule Take 1 capsule (150 mg total) by mouth every 6 (six) hours.  28 capsule  0  . oxyCODONE-acetaminophen (PERCOCET/ROXICET) 5-325 MG per tablet Take 2 tablets by mouth every 4 (four) hours as needed for severe pain.  15 tablet  0    Allergies: Allergies as of 09/19/2013 - Review Complete 09/19/2013  Allergen Reaction Noted  . Penicillins Hives 11/11/2010   Past Medical History  Diagnosis Date  . Gonorrhea 08/2011    Treated in 09/2011  . Diabetes mellitus 2001    Diagnosed at age 75 ; Type I  . DKA (diabetic ketoacidoses) 08/19/2013   Past Surgical History  Procedure Laterality Date  . No past surgeries    . Incision and drainage perirectal  abscess Right 08/18/2013    Procedure: IRRIGATION AND DEBRIDEMENT GLUTEAL ABSCESS;  Surgeon: Ralene Ok, MD;  Location: [place];  Service: General;  Laterality: Right;   Family History  Problem Relation Age of Onset  . Anesthesia problems Neg Hx   . Asthma Mother   . Gout Father   . Diabetes Paternal Grandmother    History   Social History  . Marital Status: Single    Spouse Name: N/A    Number of Children: 0  . Years of Education: 11th grade   Occupational History  . unemployed     has never worked   Social History Main Topics  . Smoking status: Never Smoker   . Smokeless tobacco: Never Used  . Alcohol Use: No  . Drug Use: No  . Sexual Activity: Yes    Birth Control/ Protection: None     Comment: As of 11/06/11 1 partner (female) not using protection   Other Topics Concern  . Not on file   Social History Narrative   Patient lives in [place] mother lives in [place].  Unemployed.  Previously worked for a IT consultant.  Completed 11 grade working on [employer]. Patient 3 brothers   No smoking, EtOH, illicits.  Review of Systems: Review of Systems  Constitutional: Negative for fever, chills and malaise/fatigue.  Eyes: Negative for blurred vision.  Respiratory: Negative for cough and shortness of breath.   Cardiovascular: Negative for chest pain and leg swelling.  Gastrointestinal: Negative for nausea, vomiting, abdominal pain, diarrhea and constipation.  Genitourinary: Negative for dysuria, frequency and hematuria.  Musculoskeletal: Negative for falls.  Neurological: Negative for dizziness, loss of consciousness, weakness and headaches.    Physical Exam: Blood pressure 126/84, pulse 131, temperature 98.6 F (37 C), temperature source Oral, resp. rate 20, height 5\' 4"  (1.626 m), weight 110 lb (49.896 kg), last menstrual period 08/24/2013, SpO2 100.00%. General: alert, cooperative, and in no apparent distress HEENT: NCAT, vision grossly intact, oropharynx clear and  non-erythematous  Neck: supple, no lymphadenopathy Lungs: clear to ascultation bilaterally, normal work of respiration, no wheezes, rales, ronchi Heart: regular rate and rhythm, no murmurs, gallops, or rubs Abdomen: soft, non-tender, non-distended, normal bowel sounds Extremities: 2+ DP/PT pulses bilaterally, no cyanosis, clubbing, or edema Neurologic: alert & oriented X3, cranial nerves II-XII intact, strength grossly intact, sensation intact to light touch GU: Right Bartholin gland abscess with 14 x 5 cm area of induration; erythematous, Wert catheter in place  Right gluteal/perirectal abscess with18 x 7 cm area of induration, actively draining purulent material   Lab results: Basic Metabolic Panel:  Recent Labs  09/19/13 1830  NA 128*  K 3.7  CL 90*  CO2 19  GLUCOSE 705*  BUN 9  CREATININE 0.58  CALCIUM 8.5   Liver Function Tests:  Recent Labs  09/19/13 1830  AST 10  ALT PENDING  ALKPHOS 138*  BILITOT 0.2*  PROT 6.6  ALBUMIN 2.7*   CBC:  Recent Labs  09/19/13 1830  WBC 19.3*  NEUTROABS 16.2*  HGB 9.0*  HCT 26.6*  MCV 78.5  PLT 488*   CBG:  Recent Labs  09/19/13 1708 09/19/13 1831  GLUCAP >600* >600*   Urinalysis:  Recent Labs  09/19/13 1846  COLORURINE STRAW*  LABSPEC 1.036*  PHURINE 6.0  GLUCOSEU >1000*  HGBUR LARGE*  BILIRUBINUR NEGATIVE  KETONESUR 15*  PROTEINUR NEGATIVE  UROBILINOGEN 0.2  NITRITE NEGATIVE  LEUKOCYTESUR MODERATE*    Other results: EKG: pending  Assessment & Plan by Problem: #Sepsis 2/2 Bartholin gland abscess, persistent perirectal abscess- see HPI, s/p I&D in ED with placement of Wert catheter.  Patient meets 2/4 SIRS criteria with leucocytosis to 19, tachycardia to 120s; afebrile, no tachypnea.  Lactic acid elevated to 2.9 on admission. Patient received one dose of clindamycin in ED.  Of note, blood cultures drawn after initiation of antibiotics.  -admit to IMTS step down  -consult to surgery, Dr. Rosendo Gros -->  patient to OR tonight, no imaging necessary -vancomycin, cefepime per pharmacy (patient has penicillin allergy) -morphine IV 2 mg q4h prn  -wound culture pending -trend lactic acid -CBC in AM   #DKA in setting of uncontrolled DM1- A1C 19.3%. On admission, glucose 705, AG 19, bicarb 19, + ketonuria.  Lactic acid 2.9.  Leucocytosis to 19, DKA very likely precipitated by infection per above. Patient is s/p NS 3L in ED. -DKA protocol (continue insulin gtt, IVFs, NPO)  -additional NS 2L bolus then NS at 150 cc/hr  -K 10 meq x 2 now -NSS --> D5-0.5NSS once BG < 250  -BMPs q2h until AG closed x 2  -monitor K and replete as necessary  -start [place] insulin once AG closed x 2  -continue insulin drip for 2 hours after [place] insulin started  -urine culture pending  -CXR in AM  -Mg, Phos in AM   #DVT PPX- SCDs given surgery    Dispo: Disposition is deferred at this time, awaiting improvement of current medical problems. Anticipated discharge in approximately 2-3 day(s).   The patient does have a current PCP Cresenciano Genre, MD) and does need an [facility] hospital follow-up appointment after discharge.   Signed: Ivin Poot, MD 09/19/2013, 8:34 PM

## 2013-09-19 NOTE — ED Provider Notes (Signed)
Medical screening examination/treatment/procedure(s) were conducted as a shared visit with non-physician practitioner(s) and myself.  I personally evaluated the patient during the encounter.   EKG Interpretation None        Neta Ehlers, MD 09/19/13 [phone]

## 2013-09-19 NOTE — ED Provider Notes (Signed)
CSN: [id number]     Arrival date & time 09/19/13  1423 History  This chart was scribed for Domenic Moras, PA-C, working with Dot Lanes, MD by Maree Erie, ED Scribe. This patient was seen in room TR11C/TR11C and the patient's care was started at 2:32 PM.      Chief Complaint  Patient presents with  . Abscess    Patient is a 23 y.o. female presenting with abscess.  Abscess Associated symptoms: no fever     HPI Comments: BELYNDA Freeman is a 23 y.o. female with a history of DM type I who presents to the Emergency Department complaining of a constant, worsening vaginal abscess that appeared seven days ago after shaving. She was seen for it in the ED on 5/19 and had an I&D performed at that time, but states the pain and swelling have worsened over the past two days. She rates the pain as an 8/10 in severity currently. She has been taking Aleve without relief. She denies vomiting.  She states that she had an abscess surgically removed from her buttocks on 4/21 by Dr. Rosendo Gros. She is up to date on her tetanus vaccination.   Past Medical History  Diagnosis Date  . Gonorrhea 08/2011    Treated in 09/2011  . Diabetes mellitus 2001    Diagnosed at age 57 ; Type I  . DKA (diabetic ketoacidoses) 08/19/2013   Past Surgical History  Procedure Laterality Date  . No past surgeries    . Incision and drainage perirectal abscess Right 08/18/2013    Procedure: IRRIGATION AND DEBRIDEMENT GLUTEAL ABSCESS;  Surgeon: Ralene Ok, MD;  Location: [facility];  Service: General;  Laterality: Right;   Family History  Problem Relation Age of Onset  . Anesthesia problems Neg Hx   . Asthma Mother   . Gout Father   . Diabetes Paternal Grandmother    History  Substance Use Topics  . Smoking status: Never Smoker   . Smokeless tobacco: Never Used  . Alcohol Use: No   OB History   Grav Para Term Preterm Abortions TAB SAB Ect Mult Living   1 0   1  1   0     Review of Systems  Constitutional:  Negative for fever and chills.  Genitourinary: Positive for vaginal pain.  Skin: Positive for wound.      Allergies  Penicillins  Home Medications   Prior to Admission medications   Medication Sig Start Date End Date Taking? Authorizing Provider  HYDROcodone-acetaminophen (NORCO/VICODIN) 5-325 MG per tablet Take 2 tablets by mouth every 4 (four) hours as needed. 09/15/13   Noland Fordyce, PA-C  insulin glargine (LANTUS) 100 unit/mL SOPN Inject 0.2 mLs (20 Units total) into the skin every morning. 08/21/13   Corky Sox, MD  insulin lispro (HUMALOG KWIKPEN) 100 UNIT/ML KiwkPen Inject 0.03 mLs (3 Units total) into the skin 3 (three) times daily. 08/21/13   Corky Sox, MD  Insulin Pen Needle 32G X 4 MM MISC For use with Lantus and Novolog insulin pens 4 times daily, in the morning and with meals 08/21/13   Corky Sox, MD   Triage Vitals: BP 137/82  Pulse 125  Temp(Src) 97.9 F (36.6 C) (Oral)  SpO2 100%  LMP 08/24/2013  Physical Exam  Nursing note and vitals reviewed. Constitutional: She is oriented to person, place, and time. She appears well-developed and well-nourished. No distress.  HENT:  Head: Normocephalic and atraumatic.  Eyes: EOM are normal.  Neck:  Neck supple. No tracheal deviation present.  Cardiovascular: Tachycardia present.   Pulmonary/Chest: Effort normal. No respiratory distress.  Genitourinary:  Right labia majora is markedly edematous, erythematous with exquisite tenderness to palpation and fluctuance. Between labia majora and labia minora is evidence of candida infection.   Musculoskeletal: Normal range of motion.  Neurological: She is alert and oriented to person, place, and time.  Skin: Skin is warm and dry.  Psychiatric: She has a normal mood and affect. Her behavior is normal.    ED Course  Procedures (including critical care time)  DIAGNOSTIC STUDIES: Oxygen Saturation is 100% on room air, normal by my interpretation.    COORDINATION OF  CARE: 2:39 PM -Will perform I&D. Patient verbalizes understanding and agrees with treatment plan.  2:55 PM - Incision and drainage chaperoned by Delbert Phenix.  INCISION AND DRAINAGE PROCEDURE NOTE: Patient identification was confirmed and verbal consent was obtained. This procedure was performed by Domenic Moras, PA-C at 2:56 PM. Site: right labia majora Sterile procedures observed Anesthetic used (type and amt): 5 mL of 2% lidocaine without epi Blade size: 11 Drainage: large amount of purulent drainage Complexity: Complex 3:24 PM- Inserted 4 mL water into word bartholin gland catheter. Site anesthetized, incision made over site, wound drained and explored loculations, rinsed with copious amounts of normal saline, wound packed with sterile gauze, covered with dry, sterile dressing.  Pt tolerated procedure well without complications.  Instructions for care discussed verbally and pt provided with additional written instructions for homecare and f/u.   Labs Review Labs Reviewed - No data to display  Imaging Review No results found.   EKG Interpretation None      MDM   Final diagnoses:  Infected cyst of Bartholin's gland duct    BP 137/82  Pulse 125  Temp(Src) 97.9 F (36.6 C) (Oral)  SpO2 100%  LMP 08/24/2013 Tachycardia 2/2 to pain, however on recheck still tachy, will give IVF and discussed with oncoming provider who will d/c pt once pt feel better and no longer tachycardic.   I personally performed the services described in this documentation, which was scribed in my presence. The recorded information has been reviewed and is accurate.     Domenic Moras, PA-C 09/19/13 1631

## 2013-09-19 NOTE — ED Provider Notes (Signed)
Pt seen for abscess, found to have FSBG >600. Pt refusing labs draw and further w/u. She wants to go home after I&D. I had discussion w/ pt about the seriousness of her glucose and concern for DKA which could lead to coma/death if untreated. She states she doesn't think she is in DKA, wants to take her meds at home & return if she feels worse. I have advised against this, explained she could already be in DKA now and needs IVF, IV insulin and admission. She has declined. She will leave AMA. She has the capacity to make this decision, understands leaving puts her at risk for coma, death.   1. Infected cyst of Bartholin's gland duct   2. Hyperglycemia   3. Noncompliance with diabetes treatment   4. Left against medical advice      Neta Ehlers, MD 09/20/13 1144

## 2013-09-19 NOTE — ED Notes (Signed)
CBG >600 °

## 2013-09-19 NOTE — ED Provider Notes (Signed)
PROGRESS NOTE                                                                                                                 This is a sign-out from PA [place] at shift change: Ashley Freeman is a 23 y.o. female presenting with abscess which has been I&D. Patient remains tachycardic. Plan is to give a liter bolus and rechecked vitals. Please refer to  previous note for full HPI, ROS, PMH and PE.   Patient seen and examined at the bedside, she is resting comfortably, IVF bolusing. Patient says that she missed her morning dose of insulin. States she feels fine and would like to go home. I have advised her that her heart rate is dangerously high this is likely secondary to elevated blood sugar and that we need to both check her blood sugar and hydrate her. Heart rate is tachycardic at 110, lung sounds are clear to auscultation bilaterally, abdominal exam is benign with no tenderness to palpation, guarding or rebound.  5:13 PM patient's fingerstick found to be over 600. I have advised the patient that she will need further blood testing, which patient declines. States she must go pick up her brother because her mother is out of town. I have explained to her that she could be in DKA and this could be a life-threatening her. Patient refuses any testing. Asked to have the IV removed. Attending physician notified.  Monico Blitz, PA-C 09/19/13 1714  5:26 PM attending physician is trying to convince the patient to stay.   5:38 Asked Pt's guest to try to convince her to stay. Guest has been in the room for all prior discussions.   5:47 PM asked RN Rolene Arbour to try to convince Pt to stay.   5:57 patient is to agreed to stay in the ED as long as she can eat. Blood work, clindamycin, glucose stabilizer ordered. Patient will be transferred to pod A for monitored bed.  6:42 Bloodwork pending, UA needs to be collected.   Patient with significant leukocytosis of 19. Patient has anion gap of 19,  urinalysis looks infected however it is contaminated. Urine culture pending. Lactic acid elevated in bicarbonate is normal. Patient with small amount of ketones in her urine. Will need admission for DKA. Patient is admitted to family practice teaching service attending physician is Dr.Joines, d/w resident Dr. Nicoletta Dress.  08:20 patient seen and examined at the bedside, she is resting comfortably, declines pain medication. She is still tachycardic, lung sounds are clear to auscultation, abdominal exam is benign with no tenderness to palpation, guarding or rebound. Patient has Ward catheter in place to right labia. She has right gluteal abscess which is actively draining purulent material.  Pt amenable to admission.   CRITICAL CARE Performed by: Monico Blitz   Total critical care time: 50  Critical care time was exclusive of separately billable procedures and treating other patients.  Critical care was necessary to treat or prevent imminent or life-threatening deterioration.  Critical care was time  spent personally by me on the following activities: development of treatment plan with patient and/or surrogate as well as nursing, discussions with consultants, evaluation of patient's response to treatment, examination of patient, obtaining history from patient or surrogate, ordering and performing treatments and interventions, ordering and review of laboratory studies, ordering and review of radiographic studies, pulse oximetry and re-evaluation of patient's condition.   Filed Vitals:   09/19/13 1611 09/19/13 1856 09/19/13 1927 09/19/13 1931  BP: 134/83 131/81 126/84   Pulse: 118 130 131   Temp: 98.6 F (37 C)  98.6 F (37 C)   TempSrc:   Oral   Resp: 18 14 20    Height:    5\' 4"  (1.626 m)  Weight:    110 lb (49.896 kg)  SpO2: 100% 100% 100%     Medications  dextrose 5 %-0.45 % sodium chloride infusion (not administered)  insulin regular (NOVOLIN R,HUMULIN R) 1 Units/mL in sodium chloride 0.9  % 100 mL infusion (not administered)  0.9 %  sodium chloride infusion (not administered)    Followed by  0.9 %  sodium chloride infusion (1,000 mLs Intravenous New Bag/Given 09/19/13 1802)    Followed by  0.9 %  sodium chloride infusion (not administered)  diphenhydrAMINE (BENADRYL) injection 25 mg (not administered)  oxyCODONE-acetaminophen (PERCOCET/ROXICET) 5-325 MG per tablet 1 tablet (1 tablet Oral Given 09/19/13 1447)  sodium chloride 0.9 % bolus 1,000 mL (0 mLs Intravenous Stopped 09/19/13 1754)  insulin aspart (novoLOG) injection 10 Units (10 Units Intravenous Given 09/19/13 1750)  morphine 4 MG/ML injection 4 mg (4 mg Intravenous Given 09/19/13 1900)  ondansetron (ZOFRAN) injection 4 mg (4 mg Intravenous Given 09/19/13 1900)  clindamycin (CLEOCIN) IVPB 600 mg (600 mg Intravenous New Bag/Given 09/19/13 1906)     Note: Portions of this report may have been transcribed using voice recognition software. Every effort was made to ensure accuracy; however, inadvertent computerized transcription errors may be present   Monico Blitz, PA-C 09/19/13 2034  Monico Blitz, PA-C 09/19/13 2036

## 2013-09-19 NOTE — Consult Note (Addendum)
Reason for Consult:gluteal and labial abscess Referring Physician: Dr. Karren Cobble is an 23 y.o. female.  HPI: The patient is a 23 year old female with a history of uncompliant diabetes. Patient was recently underwent I&D of the right gluteal abscess on 4/21. Patient comes in today with a history of seven-day history of labral abscess as well as purulence coming from her previous gluteal drainage. Patient in again comes in DKA with elevated blood glucose levels.   Past Medical History  Diagnosis Date  . Gonorrhea 08/2011    Treated in 09/2011  . Diabetes mellitus 2001    Diagnosed at age 30 ; Type I  . DKA (diabetic ketoacidoses) 08/19/2013    Past Surgical History  Procedure Laterality Date  . No past surgeries    . Incision and drainage perirectal abscess Right 08/18/2013    Procedure: IRRIGATION AND DEBRIDEMENT GLUTEAL ABSCESS;  Surgeon: Ralene Ok, MD;  Location: [facility];  Service: General;  Laterality: Right;    Family History  Problem Relation Age of Onset  . Anesthesia problems Neg Hx   . Asthma Mother   . Gout Father   . Diabetes Paternal Grandmother     Social History:  reports that she has never smoked. She has never used smokeless tobacco. She reports that she does not drink alcohol or use illicit drugs.  Allergies:  Allergies  Allergen Reactions  . Penicillins Hives    Medications: I have reviewed the patient's current medications.  Results for orders placed during the hospital encounter of 09/19/13 (from the past 48 hour(s))  CBG MONITORING, ED     Status: Abnormal   Collection Time    09/19/13  5:08 PM      Result Value Ref Range   Glucose-Capillary >600 (*) 70 - 99 mg/dL  BASIC METABOLIC PANEL     Status: Abnormal   Collection Time    09/19/13  6:30 PM      Result Value Ref Range   Sodium 128 (*) 137 - 147 mEq/L   Potassium 3.7  3.7 - 5.3 mEq/L   Chloride 90 (*) 96 - 112 mEq/L   CO2 19  19 - 32 mEq/L   Glucose, Bld 705 (*) 70 - 99  mg/dL   Comment: CRITICAL RESULT CALLED TO, READ BACK BY AND VERIFIED WITH:     RN K. COBB 1911 09/19/13 Jonita Albee M.   BUN 9  6 - 23 mg/dL   Creatinine, Ser 0.58  0.50 - 1.10 mg/dL   Calcium 8.5  8.4 - 10.5 mg/dL   GFR calc non Af Amer >90  >90 mL/min   GFR calc Af Amer >90  >90 mL/min   Comment: (NOTE)     The eGFR has been calculated using the CKD EPI equation.     This calculation has not been validated in all clinical situations.     eGFR's persistently <90 mL/min signify possible Chronic Kidney     Disease.  HEPATIC FUNCTION PANEL     Status: Abnormal   Collection Time    09/19/13  6:30 PM      Result Value Ref Range   Total Protein 6.6  6.0 - 8.3 g/dL   Albumin 2.7 (*) 3.5 - 5.2 g/dL   AST 10  0 - 37 U/L   ALT <5  0 - 35 U/L   Alkaline Phosphatase 138 (*) 39 - 117 U/L   Total Bilirubin 0.2 (*) 0.3 - 1.2 mg/dL   Bilirubin, Direct <0.2  0.0 - 0.3 mg/dL   Indirect Bilirubin NOT CALCULATED  0.3 - 0.9 mg/dL  CBC WITH DIFFERENTIAL     Status: Abnormal   Collection Time    09/19/13  6:30 PM      Result Value Ref Range   WBC 19.3 (*) 4.0 - 10.5 K/uL   RBC 3.39 (*) 3.87 - 5.11 MIL/uL   Hemoglobin 9.0 (*) 12.0 - 15.0 g/dL   HCT 26.6 (*) 36.0 - 46.0 %   MCV 78.5  78.0 - 100.0 fL   MCH 26.5  26.0 - 34.0 pg   MCHC 33.8  30.0 - 36.0 g/dL   RDW 13.6  11.5 - 15.5 %   Platelets 488 (*) 150 - 400 K/uL   Neutrophils Relative % 85 (*) 43 - 77 %   Neutro Abs 16.2 (*) 1.7 - 7.7 K/uL   Lymphocytes Relative 9 (*) 12 - 46 %   Lymphs Abs 1.8  0.7 - 4.0 K/uL   Monocytes Relative 6  3 - 12 %   Monocytes Absolute 1.2 (*) 0.1 - 1.0 K/uL   Eosinophils Relative 0  0 - 5 %   Eosinophils Absolute 0.1  0.0 - 0.7 K/uL   Basophils Relative 0  0 - 1 %   Basophils Absolute 0.0  0.0 - 0.1 K/uL  HCG, SERUM, QUALITATIVE     Status: None   Collection Time    09/19/13  6:30 PM      Result Value Ref Range   Preg, Serum NEGATIVE  NEGATIVE   Comment:            THE SENSITIVITY OF THIS     METHODOLOGY IS  >10 mIU/mL.  LACTIC ACID, PLASMA     Status: Abnormal   Collection Time    09/19/13  6:30 PM      Result Value Ref Range   Lactic Acid, Venous 2.9 (*) 0.5 - 2.2 mmol/L  CBG MONITORING, ED     Status: Abnormal   Collection Time    09/19/13  6:31 PM      Result Value Ref Range   Glucose-Capillary >600 (*) 70 - 99 mg/dL  URINALYSIS, ROUTINE W REFLEX MICROSCOPIC     Status: Abnormal   Collection Time    09/19/13  6:46 PM      Result Value Ref Range   Color, Urine STRAW (*) YELLOW   APPearance CLOUDY (*) CLEAR   Specific Gravity, Urine 1.036 (*) 1.005 - 1.030   pH 6.0  5.0 - 8.0   Glucose, UA >1000 (*) NEGATIVE mg/dL   Hgb urine dipstick LARGE (*) NEGATIVE   Bilirubin Urine NEGATIVE  NEGATIVE   Ketones, ur 15 (*) NEGATIVE mg/dL   Protein, ur NEGATIVE  NEGATIVE mg/dL   Urobilinogen, UA 0.2  0.0 - 1.0 mg/dL   Nitrite NEGATIVE  NEGATIVE   Leukocytes, UA MODERATE (*) NEGATIVE  URINE MICROSCOPIC-ADD ON     Status: Abnormal   Collection Time    09/19/13  6:46 PM      Result Value Ref Range   Squamous Epithelial / LPF MANY (*) RARE   WBC, UA 21-50  <3 WBC/hpf   RBC / HPF 7-10  <3 RBC/hpf   Bacteria, UA MANY (*) RARE   Urine-Other FEW YEAST    CBG MONITORING, ED     Status: Abnormal   Collection Time    09/19/13  9:11 PM      Result Value Ref Range   Glucose-Capillary 414 (*)  70 - 99 mg/dL    No results found.  Review of Systems  Constitutional: Negative for weight loss.  HENT: Negative for ear discharge, ear pain, hearing loss and tinnitus.   Eyes: Negative for blurred vision, double vision, photophobia and pain.  Respiratory: Negative for cough, sputum production and shortness of breath.   Cardiovascular: Negative for chest pain.  Gastrointestinal: Negative for nausea, vomiting and abdominal pain.  Genitourinary: Negative for dysuria, urgency, frequency and flank pain.  Musculoskeletal: Negative for back pain, falls, joint pain, myalgias and neck pain.  Neurological:  Negative for dizziness, tingling, sensory change, focal weakness, loss of consciousness and headaches.  Endo/Heme/Allergies: Does not bruise/bleed easily.  Psychiatric/Behavioral: Negative for depression, memory loss and substance abuse. The patient is not nervous/anxious.    Blood pressure 111/64, pulse 124, temperature 98.5 F (36.9 C), temperature source Oral, resp. rate 18, height '5\' 3"'  (1.6 m), weight 114 lb 6.7 oz (51.9 kg), last menstrual period 08/24/2013, SpO2 100.00%. Physical Exam  Vitals reviewed. Constitutional: She is oriented to person, place, and time. She appears well-developed and well-nourished. She is cooperative. No distress. Cervical collar and nasal cannula in place.  HENT:  Head: Normocephalic and atraumatic. Head is without raccoon's eyes, without Battle's sign, without abrasion, without contusion and without laceration.  Right Ear: Hearing, tympanic membrane, external ear and ear canal normal. No lacerations. No drainage or tenderness. No foreign bodies. Tympanic membrane is not perforated. No hemotympanum.  Left Ear: Hearing, tympanic membrane, external ear and ear canal normal. No lacerations. No drainage or tenderness. No foreign bodies. Tympanic membrane is not perforated. No hemotympanum.  Nose: Nose normal. No nose lacerations, sinus tenderness, nasal deformity or nasal septal hematoma. No epistaxis.  Mouth/Throat: Uvula is midline, oropharynx is clear and moist and mucous membranes are normal. No lacerations.  Eyes: Conjunctivae, EOM and lids are normal. Pupils are equal, round, and reactive to light. No scleral icterus.  Neck: Trachea normal. No JVD present. No spinous process tenderness and no muscular tenderness present. Carotid bruit is not present. No thyromegaly present.  Cardiovascular: Normal rate, regular rhythm, normal heart sounds, intact distal pulses and normal pulses.   Respiratory: Effort normal and breath sounds normal. No respiratory distress. She  exhibits no tenderness, no bony tenderness, no laceration and no crepitus.  GI: Soft. Normal appearance. She exhibits no distension. Bowel sounds are decreased. There is no tenderness. There is no rigidity, no rebound, no guarding and no CVA tenderness.  Genitourinary:     Musculoskeletal: Normal range of motion. She exhibits no edema and no tenderness.  Lymphadenopathy:    She has no cervical adenopathy.  Neurological: She is alert and oriented to person, place, and time. She has normal strength. No cranial nerve deficit or sensory deficit. GCS eye subscore is 4. GCS verbal subscore is 5. GCS motor subscore is 6.  Skin: Skin is warm, dry and intact. She is not diaphoretic.  Psychiatric: She has a normal mood and affect. Her speech is normal and behavior is normal.    Assessment/Plan: 23 year old female in DKA, with uncontrolled diabetes, and a right gluteal abscess as well as a right labial abscess. 1. We'll keep n.p.o. 2. Agree with antibiotic therapy.  3. We'll consent the patient and proceed the operating room for formal incision and drainage of her labial and gluteal abscess.   Ralene Ok 09/19/2013, 11:27 PM

## 2013-09-19 NOTE — ED Notes (Signed)
Pt agree to stay after informed the risk for leaving with DKA.

## 2013-09-19 NOTE — Progress Notes (Signed)
ANTIBIOTIC CONSULT NOTE - INITIAL  Pharmacy Consult for vancomycin, cefepime Indication: rule out sepsis  Allergies  Allergen Reactions  . Penicillins Hives    Patient Measurements: Height: 5\' 3"  (160 cm) Weight: 114 lb 6.7 oz (51.9 kg) IBW/kg (Calculated) : 52.4   Vital Signs: Temp: 98.5 F (36.9 C) (05/23 2137) Temp src: Oral (05/23 2137) BP: 111/64 mmHg (05/23 2137) Pulse Rate: 124 (05/23 2137) Intake/Output from previous day:   Intake/Output from this shift:    Labs:  Recent Labs  09/19/13 1830  WBC 19.3*  HGB 9.0*  PLT 488*  CREATININE 0.58   Estimated Creatinine Clearance: 91.1 ml/min (by C-G formula based on Cr of 0.58). No results found for this basename: VANCOTROUGH, VANCOPEAK, VANCORANDOM, GENTTROUGH, GENTPEAK, GENTRANDOM, TOBRATROUGH, TOBRAPEAK, TOBRARND, AMIKACINPEAK, AMIKACINTROU, AMIKACIN,  in the last 72 hours   Microbiology: Recent Results (from the past 720 hour(s))  WET PREP, GENITAL     Status: Abnormal   Collection Time    09/15/13  3:39 PM      Result Value Ref Range Status   Yeast Wet Prep HPF POC NONE SEEN  NONE SEEN Final   Trich, Wet Prep MODERATE (*) NONE SEEN Final   Clue Cells Wet Prep HPF POC FEW (*) NONE SEEN Final   WBC, Wet Prep HPF POC MODERATE (*) NONE SEEN Final  GC/CHLAMYDIA PROBE AMP     Status: None   Collection Time    09/15/13  3:39 PM      Result Value Ref Range Status   CT Probe RNA NEGATIVE  NEGATIVE Final   GC Probe RNA NEGATIVE  NEGATIVE Final   Comment: (NOTE)                                                                                               **Normal Reference Range: Negative**          Assay performed using the Gen-Probe APTIMA COMBO2 (R) Assay.     Acceptable specimen types for this assay include APTIMA Swabs (Unisex,     endocervical, urethral, or vaginal), first void urine, and ThinPrep     liquid based cytology samples.     Performed at [facility] History: Past Medical  History  Diagnosis Date  . Gonorrhea 08/2011    Treated in 09/2011  . Diabetes mellitus 2001    Diagnosed at age 5 ; Type I  . DKA (diabetic ketoacidoses) 08/19/2013    Medications:  Prescriptions prior to admission  Medication Sig Dispense Refill  . insulin glargine (LANTUS) 100 unit/mL SOPN Inject 0.2 mLs (20 Units total) into the skin every morning.  15 mL  11  . insulin lispro (HUMALOG KWIKPEN) 100 UNIT/ML KiwkPen Inject 0.03 mLs (3 Units total) into the skin 3 (three) times daily.  15 mL  11   Assessment: 23 year old female in DKA on insulin gtt, initiating antibiotics for sepsis likely due to perirectal abscess. Pt has leukocytosis to 19, is afebrile but is tachycardic to 120s.    Goal of Therapy:  Vancomycin trough level 15-20 mcg/ml  Plan:  Cefepime 1g/12h Vancomycin 500 mg/8h Check VT at steady state F/u culture, sensitivities Follow fever curve, clinical progression   [name], PharmD, BCPS Clinical Pharmacist Pager: [phone] 09/19/2013 10:27 PM

## 2013-09-19 NOTE — ED Notes (Signed)
Abscess in labia. Recently here for same.

## 2013-09-19 NOTE — ED Notes (Addendum)
Pt expressed that she wants to go home and she doses not care if CBG >600 or in DKA. Pisciotta PA notified.

## 2013-09-20 ENCOUNTER — Inpatient Hospital Stay (HOSPITAL_COMMUNITY): Payer: Self-pay | Admitting: Certified Registered Nurse Anesthetist

## 2013-09-20 ENCOUNTER — Encounter (HOSPITAL_COMMUNITY): Payer: Self-pay | Admitting: Certified Registered Nurse Anesthetist

## 2013-09-20 LAB — BASIC METABOLIC PANEL
BUN: 4 mg/dL — ABNORMAL LOW (ref 6–23)
BUN: 4 mg/dL — ABNORMAL LOW (ref 6–23)
BUN: 3 mg/dL — ABNORMAL LOW (ref 6–23)
BUN: 5 mg/dL — AB (ref 6–23)
CALCIUM: 8.1 mg/dL — AB (ref 8.4–10.5)
CHLORIDE: 102 meq/L (ref 96–112)
CO2: 20 meq/L (ref 19–32)
CO2: 21 meq/L (ref 19–32)
CO2: 19 meq/L (ref 19–32)
CO2: 22 mEq/L (ref 19–32)
CREATININE: 0.57 mg/dL (ref 0.50–1.10)
Calcium: 8 mg/dL — ABNORMAL LOW (ref 8.4–10.5)
Calcium: 8.1 mg/dL — ABNORMAL LOW (ref 8.4–10.5)
Calcium: 8.1 mg/dL — ABNORMAL LOW (ref 8.4–10.5)
Chloride: 103 mEq/L (ref 96–112)
Chloride: 101 mEq/L (ref 96–112)
Chloride: 102 mEq/L (ref 96–112)
Creatinine, Ser: 0.5 mg/dL (ref 0.50–1.10)
Creatinine, Ser: 0.5 mg/dL (ref 0.50–1.10)
Creatinine, Ser: 0.61 mg/dL (ref 0.50–1.10)
GFR calc Af Amer: >90 mL/min (ref >90)
GFR calc Af Amer: >90 mL/min (ref >90)
GFR calc Af Amer: >90 mL/min (ref >90)
GFR calc Af Amer: >90 mL/min (ref >90)
GFR calc non Af Amer: >90 mL/min (ref >90)
GFR calc non Af Amer: >90 mL/min (ref >90)
GFR calc non Af Amer: >90 mL/min (ref >90)
GLUCOSE: 163 mg/dL — AB (ref 70–99)
GLUCOSE: 163 mg/dL — AB (ref 70–99)
GLUCOSE: 298 mg/dL — AB (ref 70–99)
Glucose, Bld: 217 mg/dL — ABNORMAL HIGH (ref 70–99)
POTASSIUM: 4 meq/L (ref 3.7–5.3)
POTASSIUM: 4 meq/L (ref 3.7–5.3)
Potassium: 4 mEq/L (ref 3.7–5.3)
Potassium: 3.6 mEq/L — ABNORMAL LOW (ref 3.7–5.3)
SODIUM: 134 meq/L — AB (ref 137–147)
Sodium: 135 mEq/L — ABNORMAL LOW (ref 137–147)
Sodium: 136 mEq/L — ABNORMAL LOW (ref 137–147)
Sodium: 135 mEq/L — ABNORMAL LOW (ref 137–147)

## 2013-09-20 LAB — CBC
HEMATOCRIT: 30.6 % — AB (ref 36.0–46.0)
HEMOGLOBIN: 10 g/dL — AB (ref 12.0–15.0)
MCH: 26.2 pg (ref 26.0–34.0)
MCHC: 32.7 g/dL (ref 30.0–36.0)
MCV: 80.3 fL (ref 78.0–100.0)
Platelets: 486 10*3/uL — ABNORMAL HIGH (ref 150–400)
RBC: 3.81 MIL/uL — ABNORMAL LOW (ref 3.87–5.11)
RDW: 13.8 % (ref 11.5–15.5)
WBC: 20.4 10*3/uL — ABNORMAL HIGH (ref 4.0–10.5)

## 2013-09-20 LAB — GLUCOSE, CAPILLARY
GLUCOSE-CAPILLARY: 320 mg/dL — AB (ref 70–99)
GLUCOSE-CAPILLARY: 286 mg/dL — AB (ref 70–99)
GLUCOSE-CAPILLARY: 172 mg/dL — AB (ref 70–99)
GLUCOSE-CAPILLARY: 141 mg/dL — AB (ref 70–99)
GLUCOSE-CAPILLARY: 125 mg/dL — AB (ref 70–99)
Glucose-Capillary: 162 mg/dL — ABNORMAL HIGH (ref 70–99)
Glucose-Capillary: 146 mg/dL — ABNORMAL HIGH (ref 70–99)
Glucose-Capillary: 187 mg/dL — ABNORMAL HIGH (ref 70–99)
Glucose-Capillary: 179 mg/dL — ABNORMAL HIGH (ref 70–99)
Glucose-Capillary: 110 mg/dL — ABNORMAL HIGH (ref 70–99)
Glucose-Capillary: 105 mg/dL — ABNORMAL HIGH (ref 70–99)
Glucose-Capillary: 149 mg/dL — ABNORMAL HIGH (ref 70–99)
Glucose-Capillary: 171 mg/dL — ABNORMAL HIGH (ref 70–99)
Glucose-Capillary: 229 mg/dL — ABNORMAL HIGH (ref 70–99)
Glucose-Capillary: 298 mg/dL — ABNORMAL HIGH (ref 70–99)

## 2013-09-20 LAB — URINALYSIS, ROUTINE W REFLEX MICROSCOPIC
Bilirubin Urine: NEGATIVE
Glucose, UA: >1000 mg/dL — AB
Hgb urine dipstick: NEGATIVE
Ketones, ur: NEGATIVE mg/dL
Leukocytes, UA: NEGATIVE
NITRITE: NEGATIVE
Protein, ur: NEGATIVE mg/dL
SPECIFIC GRAVITY, URINE: 1.02 (ref 1.005–1.030)
UROBILINOGEN UA: 0.2 mg/dL (ref 0.0–1.0)
pH: 6 (ref 5.0–8.0)

## 2013-09-20 LAB — URINE MICROSCOPIC-ADD ON

## 2013-09-20 LAB — PHOSPHORUS: PHOSPHORUS: 2.3 mg/dL (ref 2.3–4.6)

## 2013-09-20 LAB — LACTIC ACID, PLASMA: Lactic Acid, Venous: 1.6 mmol/L (ref 0.5–2.2)

## 2013-09-20 MED ORDER — INSULIN ASPART 100 UNIT/ML ~~LOC~~ SOLN
0.0000 [IU] | Freq: Three times a day (TID) | SUBCUTANEOUS | Status: DC
  Administered 2013-09-21: 15 [IU] via SUBCUTANEOUS
  Administered 2013-09-21: 3 [IU] via SUBCUTANEOUS
  Administered 2013-09-21: 5 [IU] via SUBCUTANEOUS
  Administered 2013-09-22: 2 [IU] via SUBCUTANEOUS
  Administered 2013-09-22: 5 [IU] via SUBCUTANEOUS
  Administered 2013-09-22: 8 [IU] via SUBCUTANEOUS
  Administered 2013-09-23: 15 [IU] via SUBCUTANEOUS
  Administered 2013-09-23: 3 [IU] via SUBCUTANEOUS
  Administered 2013-09-23: 5 [IU] via SUBCUTANEOUS
  Administered 2013-09-24: 15 [IU] via SUBCUTANEOUS
  Administered 2013-09-24: 10 [IU] via SUBCUTANEOUS
  Administered 2013-09-25 (×2): 11 [IU] via SUBCUTANEOUS
  Administered 2013-09-25: 5 [IU] via SUBCUTANEOUS
  Administered 2013-09-26: 3 [IU] via SUBCUTANEOUS
  Administered 2013-09-26: 11 [IU] via SUBCUTANEOUS
  Administered 2013-09-26: 3 [IU] via SUBCUTANEOUS
  Administered 2013-09-27: 2 [IU] via SUBCUTANEOUS
  Administered 2013-09-27: 11 [IU] via SUBCUTANEOUS
  Administered 2013-09-27: 5 [IU] via SUBCUTANEOUS
  Administered 2013-09-28 (×2): 8 [IU] via SUBCUTANEOUS
  Administered 2013-09-28: 5 [IU] via SUBCUTANEOUS
  Administered 2013-09-29: 3 [IU] via SUBCUTANEOUS
  Administered 2013-09-29: 15 [IU] via SUBCUTANEOUS

## 2013-09-20 MED ORDER — PROPOFOL 10 MG/ML IV BOLUS
INTRAVENOUS | Status: DC | PRN
  Administered 2013-09-20: 120 mg via INTRAVENOUS

## 2013-09-20 MED ORDER — HYDROMORPHONE HCL PF 1 MG/ML IJ SOLN: 0.2500 mg | INTRAMUSCULAR | Status: DC | PRN

## 2013-09-20 MED ORDER — LIDOCAINE-EPINEPHRINE (PF) 1 %-1:200000 IJ SOLN: INTRAMUSCULAR | Status: DC | PRN

## 2013-09-20 MED ORDER — MORPHINE SULFATE 2 MG/ML IJ SOLN
2.0000 mg | Freq: Once | INTRAMUSCULAR | Status: AC
  Administered 2013-09-20: 2 mg via INTRAVENOUS
  Filled 2013-09-20: qty 1

## 2013-09-20 MED ORDER — INSULIN GLARGINE 100 UNIT/ML ~~LOC~~ SOLN
20.0000 [IU] | Freq: Once | SUBCUTANEOUS | Status: AC
  Administered 2013-09-20: 20 [IU] via SUBCUTANEOUS
  Filled 2013-09-20: qty 0.2

## 2013-09-20 MED ORDER — LIDOCAINE HCL 2 % EX GEL
Freq: Once | CUTANEOUS | Status: AC
  Administered 2013-09-20: 5 via URETHRAL
  Filled 2013-09-20 (×2): qty 5

## 2013-09-20 MED ORDER — FENTANYL CITRATE 0.05 MG/ML IJ SOLN
INTRAMUSCULAR | Status: DC | PRN
  Administered 2013-09-20 (×3): 50 ug via INTRAVENOUS
  Administered 2013-09-20: 100 ug via INTRAVENOUS

## 2013-09-20 MED ORDER — DIPHENHYDRAMINE HCL 25 MG PO CAPS
25.0000 mg | ORAL_CAPSULE | Freq: Four times a day (QID) | ORAL | Status: DC | PRN
  Administered 2013-09-20 – 2013-09-26 (×3): 25 mg via ORAL
  Filled 2013-09-20 (×3): qty 1

## 2013-09-20 MED ORDER — OXYCODONE HCL 5 MG/5ML PO SOLN: 5.0000 mg | Freq: Once | ORAL | Status: DC | PRN

## 2013-09-20 MED ORDER — OXYCODONE-ACETAMINOPHEN 5-325 MG PO TABS
1.0000 | ORAL_TABLET | ORAL | Status: DC | PRN
  Administered 2013-09-20: 2 via ORAL
  Administered 2013-09-20: 1 via ORAL
  Administered 2013-09-21: 2 via ORAL
  Administered 2013-09-21: 1 via ORAL
  Administered 2013-09-21 – 2013-09-26 (×15): 2 via ORAL
  Filled 2013-09-20: qty 1
  Filled 2013-09-20 (×12): qty 2
  Filled 2013-09-20: qty 1
  Filled 2013-09-20 (×5): qty 2

## 2013-09-20 MED ORDER — OXYCODONE HCL 5 MG PO TABS: 5.0000 mg | ORAL_TABLET | Freq: Once | ORAL | Status: DC | PRN

## 2013-09-20 MED ORDER — HYDROMORPHONE HCL PF 1 MG/ML IJ SOLN
1.0000 mg | INTRAMUSCULAR | Status: DC | PRN
  Administered 2013-09-20: 1 mg via INTRAVENOUS
  Filled 2013-09-20: qty 1

## 2013-09-20 MED ORDER — ARTIFICIAL TEARS OP OINT
TOPICAL_OINTMENT | OPHTHALMIC | Status: DC | PRN
  Administered 2013-09-20: 1 via OPHTHALMIC

## 2013-09-20 MED ORDER — INSULIN GLARGINE 100 UNIT/ML ~~LOC~~ SOLN
20.0000 [IU] | Freq: Every day | SUBCUTANEOUS | Status: DC
  Administered 2013-09-21 – 2013-09-22 (×2): 20 [IU] via SUBCUTANEOUS
  Filled 2013-09-20 (×2): qty 0.2

## 2013-09-20 MED ORDER — LIDOCAINE HCL (CARDIAC) 20 MG/ML IV SOLN
INTRAVENOUS | Status: DC | PRN
  Administered 2013-09-20: 40 mg via INTRAVENOUS

## 2013-09-20 MED ORDER — INSULIN ASPART 100 UNIT/ML ~~LOC~~ SOLN
0.0000 [IU] | Freq: Every day | SUBCUTANEOUS | Status: DC
  Administered 2013-09-20 – 2013-09-21 (×2): 3 [IU] via SUBCUTANEOUS
  Administered 2013-09-22 – 2013-09-25 (×2): 2 [IU] via SUBCUTANEOUS
  Administered 2013-09-26: 3 [IU] via SUBCUTANEOUS
  Administered 2013-09-27: 7 [IU] via SUBCUTANEOUS

## 2013-09-20 MED ORDER — 0.9 % SODIUM CHLORIDE (POUR BTL) OPTIME
TOPICAL | Status: DC | PRN
  Administered 2013-09-20: 1000 mL

## 2013-09-20 MED ORDER — ACETAMINOPHEN 500 MG PO TABS
500.0000 mg | ORAL_TABLET | Freq: Four times a day (QID) | ORAL | Status: DC | PRN
  Administered 2013-09-20: 500 mg via ORAL
  Filled 2013-09-20: qty 1

## 2013-09-20 MED ORDER — SUCCINYLCHOLINE CHLORIDE 20 MG/ML IJ SOLN
INTRAMUSCULAR | Status: DC | PRN
  Administered 2013-09-20: 80 mg via INTRAVENOUS

## 2013-09-20 MED ORDER — BUPIVACAINE-EPINEPHRINE (PF) 0.25% -1:200000 IJ SOLN
INTRAMUSCULAR | Status: DC | PRN
  Administered 2013-09-20: 10 mL

## 2013-09-20 MED ORDER — SODIUM CHLORIDE 0.9 % IV SOLN
100.0000 [IU] | INTRAVENOUS | Status: DC | PRN
  Administered 2013-09-19: 4.5 [IU]/h via INTRAVENOUS

## 2013-09-20 MED ORDER — INSULIN ASPART 100 UNIT/ML ~~LOC~~ SOLN
0.0000 [IU] | Freq: Three times a day (TID) | SUBCUTANEOUS | Status: DC
  Administered 2013-09-20: 5 [IU] via SUBCUTANEOUS
  Administered 2013-09-20: 2 [IU] via SUBCUTANEOUS
  Administered 2013-09-20: 3 [IU] via SUBCUTANEOUS

## 2013-09-20 MED ORDER — ONDANSETRON HCL 4 MG/2ML IJ SOLN: 4.0000 mg | Freq: Once | INTRAMUSCULAR | Status: DC | PRN

## 2013-09-20 MED ORDER — BUPIVACAINE-EPINEPHRINE (PF) 0.25% -1:200000 IJ SOLN
INTRAMUSCULAR | Status: AC
  Filled 2013-09-20: qty 30

## 2013-09-20 NOTE — Transfer of Care (Signed)
Immediate Anesthesia Transfer of Care Note  Patient: Ashley Freeman  Procedure(s) Performed: Procedure(s): IRRIGATION AND DEBRIDEMENT RIGHT GLUTEAL AND LABIAL ABSCESSES (Right)  Patient Location: PACU  Anesthesia Type:General  Level of Consciousness: awake and patient cooperative  Airway & Oxygen Therapy: Patient Spontanous Breathing and Patient connected to face mask oxygen  Post-op Assessment: Report given to PACU RN and Post -op Vital signs reviewed and stable  Post vital signs: Reviewed and stable  Complications: No apparent anesthesia complications

## 2013-09-20 NOTE — Progress Notes (Signed)
Subjective: No acute events overnight.  Transitions to [place] Lantus early this AM.   Hungry this AM, planning to eat breakfast.  Gluteal/vaginal pain is well controlled.    Objective: Vital signs in last 24 hours: Filed Vitals:   09/20/13 0136 09/20/13 0154 09/20/13 0455 09/20/13 0730  BP: 113/70 104/76 107/70 107/69  Pulse: 120 130 114 107  Temp: 99.7 F (37.6 C) 99.1 F (37.3 C) 98.5 F (36.9 C) 100.6 F (38.1 C)  TempSrc:  Oral Oral Oral  Resp: 26 12 20 20   Height:      Weight:   51.6 kg (113 lb 12.1 oz)   SpO2: 97% 99% 100% 100%   Weight change:   Intake/Output Summary (Last 24 hours) at 09/20/13 1018 Last data filed at 09/20/13 0912  Gross per 24 hour  Intake   3950 ml  Output    650 ml  Net   3300 ml   General: resting in bed in NAD HEENT: no gross abnormality Cardiac: RRR, no rubs, murmurs or gallops Pulm: decreased BS RLL, otherwise CTA, moving normal volumes of air Abd: soft, nontender, nondistended, BS present Ext: warm and well perfused, no pedal edema Neuro: alert and oriented X3, responding appropriately GU:  ABD pad covering gluteal/labial surgical sites  Lab Results: Basic Metabolic Panel:  Recent Labs Lab 09/19/13 2247 09/20/13 0320  NA 132* 135*  136*  K 3.9 4.0  4.0  CL 97 103  102  CO2 21 20  21   GLUCOSE 324* 163*  163*  BUN 6 4*  4*  CREATININE 0.57 0.50  0.50  CALCIUM 8.5 8.1*  8.0*  MG 1.7  --   PHOS 2.2* 2.3   Liver Function Tests:  Recent Labs Lab 09/19/13 1830  AST 10  ALT <5  ALKPHOS 138*  BILITOT 0.2*  PROT 6.6  ALBUMIN 2.7*   CBC:  Recent Labs Lab 09/19/13 1830 09/20/13 0320  WBC 19.3* 20.4*  NEUTROABS 16.2*  --   HGB 9.0* 10.0*  HCT 26.6* 30.6*  MCV 78.5 80.3  PLT 488* 486*   CBG:  Recent Labs Lab 09/20/13 0228 09/20/13 0350 09/20/13 0454 09/20/13 0554 09/20/13 0702 09/20/13 0806  GLUCAP 146* 187* 179* 141* 110* 105*   Urine Drug Screen: Drugs of Abuse     Component Value Date/Time   LABOPIA NONE DETECTED 08/09/2013 0005   LABOPIA NEGATIVE 12/18/2009 1453   COCAINSCRNUR NONE DETECTED 08/09/2013 0005   COCAINSCRNUR NEGATIVE 12/18/2009 1453   LABBENZ NONE DETECTED 08/09/2013 0005   LABBENZ NEGATIVE 12/18/2009 1453   AMPHETMU NONE DETECTED 08/09/2013 0005   AMPHETMU NEGATIVE 12/18/2009 1453   THCU NONE DETECTED 08/09/2013 0005   LABBARB NONE DETECTED 08/09/2013 0005    Urinalysis:  Recent Labs Lab 09/19/13 1846  COLORURINE STRAW*  LABSPEC 1.036*  PHURINE 6.0  GLUCOSEU >1000*  HGBUR LARGE*  BILIRUBINUR NEGATIVE  KETONESUR 15*  PROTEINUR NEGATIVE  UROBILINOGEN 0.2  NITRITE NEGATIVE  LEUKOCYTESUR MODERATE*    Micro Results: Recent Results (from the past 240 hour(s))  WET PREP, GENITAL     Status: Abnormal   Collection Time    09/15/13  3:39 PM      Result Value Ref Range Status   Yeast Wet Prep HPF POC NONE SEEN  NONE SEEN Final   Trich, Wet Prep MODERATE (*) NONE SEEN Final   Clue Cells Wet Prep HPF POC FEW (*) NONE SEEN Final   WBC, Wet Prep HPF POC MODERATE (*) NONE SEEN Final  GC/CHLAMYDIA PROBE AMP     Status: None   Collection Time    09/15/13  3:39 PM      Result Value Ref Range Status   CT Probe RNA NEGATIVE  NEGATIVE Final   GC Probe RNA NEGATIVE  NEGATIVE Final   Comment: (NOTE)                                                                                               **Normal Reference Range: Negative**          Assay performed using the Gen-Probe APTIMA COMBO2 (R) Assay.     Acceptable specimen types for this assay include APTIMA Swabs (Unisex,     endocervical, urethral, or vaginal), first void urine, and ThinPrep     liquid based cytology samples.     Performed at [facility] PCR SCREENING     Status: None   Collection Time    09/19/13 10:01 PM      Result Value Ref Range Status   MRSA by PCR NEGATIVE  NEGATIVE Final   Comment:            The GeneXpert MRSA Assay (FDA     approved for NASAL specimens     only), is one  component of a     comprehensive MRSA colonization     surveillance program. It is not     intended to diagnose MRSA     infection nor to guide or     monitor treatment for     MRSA infections.   Medications: I have reviewed the patient's current medications. Scheduled Meds: . ceFEPime (MAXIPIME) IV  1 g Intravenous Q12H  . insulin aspart  0-15 Units Subcutaneous TID WC  . vancomycin  500 mg Intravenous Q8H   Continuous Infusions: . sodium chloride 1,000 mL (09/20/13 0912)   PRN Meds:.acetaminophen, dextrose, HYDROmorphone (DILAUDID) injection, morphine injection, ondansetron (ZOFRAN) IV, oxyCODONE-acetaminophen Assessment/Plan: Sepsis 2/2 right labial abscess and persistent gluteal abscess: Patient meets 2/4 SIRS criteria with leucocytosis to 19, tachycardia to 120s; afebrile, no tachypnea. Lactic acid elevated to 2.9, now 1.6.  Surgery consulted and appreciate surgical intervention.  Patient is s/p I&D of both abscesses in OR last night.  Pain is well controlled. - continue vancomycin, cefepime per pharmacy (patient has penicillin allergy)  - morphine IV 2 mg q4h prn  - BID wet-to-dry gauze changes to each wound  - wound culture pending  - monitor CBC and for s&s of infection  DKA in setting of uncontrolled DM1, resolved: A1C 19.3%. On admission, glucose 705-->163, AG 19-->12, bicarb 19, + ketonuria. Lactic acid 2.9-->1.6. Leucocytosis to 19, DKA very likely precipitated by infection per above. Patient is s/p NS 3L in ED.  Off insulin gtt this AM and transitioned to [facility]. - continue Lantus 20 daily and SSI moderate - continue D5-0.5NSS  - monitor CBG - urine culture pending   Diet:  Carb mod VTE ppx: Heparin Martin Code:  Full  Dispo: Disposition is deferred at this time, awaiting improvement of current medical problems.  Anticipated discharge in approximately 2-3 day(s).  The patient does have a current PCP Cresenciano Genre, MD) and does need an [facility] hospital follow-up appointment  after discharge.  The patient does not know have transportation limitations that hinder transportation to clinic appointments.  .Services Needed at time of discharge: Y = Yes, Blank = No PT:   OT:   RN:   Equipment:   Other:     LOS: 1 day   Duwaine Maxin, DO 09/20/2013, 10:18 AM

## 2013-09-20 NOTE — Progress Notes (Signed)
Pt transferred to short stay with RN and NT on monitor. Pt cbg taken before transport. VSS, pt signed consent and receiving RN aware and at bedside.

## 2013-09-20 NOTE — Anesthesia Preprocedure Evaluation (Addendum)
Anesthesia Evaluation  Patient identified by MRN, date of birth, ID band Patient awake    Reviewed: Allergy & Precautions, H&P , NPO status , Patient's Chart, lab work & pertinent test results  Airway Mallampati: II TM Distance: >3 FB Neck ROM: Full    Dental  (+) Teeth Intact, Dental Advisory Given   Pulmonary  breath sounds clear to auscultation        Cardiovascular Rhythm:Regular Rate:Normal     Neuro/Psych    GI/Hepatic   Endo/Other  diabetes  Renal/GU      Musculoskeletal   Abdominal   Peds  Hematology   Anesthesia Other Findings   Reproductive/Obstetrics                          Anesthesia Physical Anesthesia Plan  ASA: III and emergent  Anesthesia Plan: General   Post-op Pain Management:    Induction: Intravenous  Airway Management Planned: Oral ETT  Additional Equipment:   Intra-op Plan:   Post-operative Plan: Extubation in OR  Informed Consent: I have reviewed the patients History and Physical, chart, labs and discussed the procedure including the risks, benefits and alternatives for the proposed anesthesia with the patient or authorized representative who has indicated his/her understanding and acceptance.   Dental advisory given  Plan Discussed with: CRNA and Anesthesiologist  Anesthesia Plan Comments: (Gluteal Abscess Type 1 DM with DKA on insulin drip, last glucose 324, treated with IV hydration and insulin drip  Plan GA with oral ETT and RSI  Roberts Gaudy, MD)      Anesthesia Quick Evaluation

## 2013-09-20 NOTE — Op Note (Signed)
09/19/2013 - 09/20/2013  12:53 AM  PATIENT:  Ashley Freeman  23 y.o. female  PRE-OPERATIVE DIAGNOSIS:  right gluteal and labial abscesses  POST-OPERATIVE DIAGNOSIS:  right gluteal and labial abscesses  PROCEDURE:  Procedure(s): IRRIGATION AND DEBRIDEMENT RIGHT GLUTEAL AND LABIAL ABSCESSES (Right)  SURGEON:  Surgeon(s) and Role:    * Ralene Ok, MD - Primary   ASSISTANTS: none   ANESTHESIA:   local and general  EBL:  Total I/O In: 1750 [I.V.:500; IV Piggyback:1250] Out: -   BLOOD ADMINISTERED:none  DRAINS: Half-inch iodoform drain each abscess cavity   LOCAL MEDICATIONS USED:  BUPIVICAINE   SPECIMEN:  Source of Specimen:  Right labial abscess  DISPOSITION OF SPECIMEN:  Microbiology  COUNTS:  YES  TOURNIQUET:  * No tourniquets in log *  DICTATION: .Dragon Dictation The patient was brought to the operating room and placed in the prone position bilateral SCDs in place. The patient was then prepped and draped in the usual sterile fashion. A timeout was called all fact and antibiotics were verified.  The previous right gluteal abscess was bluntly opened up. There was a large amount of purulence that was expressed from this wound. This wound did track perianally and posteriorly. The area and all pockets were broken up bluntly. This was area that was sterile saline.  The right labial drain that was placed in the ER was removed in the area where was placed was incised. There was minimal amount of purulence. However just lateral to this there was an area drainage of purulence this was incised and a small pocket of pus which drained. Microbiology cultures were taken from this wound.   There is no connection to the right gluteal abscess. This area was also irrigated out with sterile saline. Each abscess cavity incision site was then packed with half-inch iodoform gauze. The area was injected with quarter percent bupivacaine. The area was then dressed with 4 x 4's, ABDs, and  mesh panties.  The patient tolerated the procedure well and was taken to the recovery was stable condition.  PLAN OF CARE: Admit to inpatient   PATIENT DISPOSITION:  PACU - hemodynamically stable.   Delay start of Pharmacological VTE agent (>24hrs) due to surgical blood loss or risk of bleeding: not applicable

## 2013-09-20 NOTE — Anesthesia Postprocedure Evaluation (Signed)
  Anesthesia Post-op Note  Patient: Ashley Freeman  Procedure(s) Performed: Procedure(s): IRRIGATION AND DEBRIDEMENT RIGHT GLUTEAL AND LABIAL ABSCESSES (Right)  Patient Location: PACU  Anesthesia Type:General  Level of Consciousness: awake, oriented and sedated  Airway and Oxygen Therapy: Patient Spontanous Breathing and Patient connected to nasal cannula oxygen  Post-op Pain: mild  Post-op Assessment: Post-op Vital signs reviewed, Patient's Cardiovascular Status Stable, Respiratory Function Stable, Patent Airway and Pain level controlled  Post-op Vital Signs: stable  Last Vitals:  Filed Vitals:   09/20/13 0455  BP: 107/70  Pulse: 114  Temp: 36.9 C  Resp: 20    Complications: No apparent anesthesia complications

## 2013-09-20 NOTE — Progress Notes (Signed)
Pt transferred from PACU with RN. Pt sleepy but arousable, VSS, will continue to monitor.

## 2013-09-20 NOTE — ED Provider Notes (Signed)
Medical screening examination/treatment/procedure(s) were performed by non-physician practitioner and as supervising physician I was immediately available for consultation/collaboration.   Dot Lanes, MD 09/20/13 [phone]

## 2013-09-20 NOTE — H&P (Signed)
Internal Medicine On-Call Attending Admission Note Date: 09/20/2013  Patient name: Ashley Freeman Medical record number: [id number] Date of birth: 1990/09/24 Age: 23 y.o. Gender: female  I saw and evaluated the patient. I reviewed the resident's note and I agree with the resident's findings and plan as documented in the resident's note, with the following additional comments.  Chief Complaint(s): Abscesses in right gluteal and right labial area  History - key components related to admission: Patient is a 23 year old woman with poorly controlled Type 1 diabetes mellitus, history of DKA, status post incision and drainage of right gluteal abscess 08/18/2013, admitted with right labial and right gluteal abscesses and DKA.  She underwent irrigation and debridement of the abscesses last night by Dr. Rosendo Gros.   Physical Exam - key components related to admission:  Filed Vitals:   09/20/13 0136 09/20/13 0154 09/20/13 0455 09/20/13 0730  BP: 113/70 104/76 107/70 107/69  Pulse: 120 130 114 107  Temp: 99.7 F (37.6 C) 99.1 F (37.3 C) 98.5 F (36.9 C) 100.6 F (38.1 C)  TempSrc:  Oral Oral Oral  Resp: 26 12 20 20   Height:      Weight:   113 lb 12.1 oz (51.6 kg)   SpO2: 97% 99% 100% 100%    General: Alert, no distress Lungs: Clear Heart: Regular; no extra sounds or murmurs Abdomen: Bowel sounds present, soft, nontender Extremities: No edema  Lab results:   Basic Metabolic Panel:  Recent Labs  09/19/13 2247 09/20/13 0320  NA 132* 135*  136*  K 3.9 4.0  4.0  CL 97 103  102  CO2 21 20  21   GLUCOSE 324* 163*  163*  BUN 6 4*  4*  CREATININE 0.57 0.50  0.50  CALCIUM 8.5 8.1*  8.0*  MG 1.7  --   PHOS 2.2* 2.3    Liver Function Tests:  Recent Labs  09/19/13 1830  AST 10  ALT <5  ALKPHOS 138*  BILITOT 0.2*  PROT 6.6  ALBUMIN 2.7*    CBC:  Recent Labs  09/19/13 1830 09/20/13 0320  WBC 19.3* 20.4*  HGB 9.0* 10.0*  HCT 26.6* 30.6*  MCV 78.5 80.3   PLT 488* 486*    Recent Labs  09/19/13 1830  NEUTROABS 16.2*  LYMPHSABS 1.8  MONOABS 1.2*  EOSABS 0.1  BASOSABS 0.0      CBG:  Recent Labs  09/20/13 0228 09/20/13 0350 09/20/13 0454 09/20/13 0554 09/20/13 0702 09/20/13 0806  GLUCAP 146* 187* 179* 141* 110* 105*       Urinalysis    Component Value Date/Time   COLORURINE STRAW* 09/19/2013 1846   APPEARANCEUR CLOUDY* 09/19/2013 1846   LABSPEC 1.036* 09/19/2013 1846   PHURINE 6.0 09/19/2013 1846   GLUCOSEU >1000* 09/19/2013 1846   HGBUR LARGE* 09/19/2013 1846   BILIRUBINUR NEGATIVE 09/19/2013 1846   KETONESUR 15* 09/19/2013 1846   PROTEINUR NEGATIVE 09/19/2013 1846   UROBILINOGEN 0.2 09/19/2013 1846   NITRITE NEGATIVE 09/19/2013 1846   LEUKOCYTESUR MODERATE* 09/19/2013 1846    Urine microscopic:  Recent Labs  09/19/13 1846  EPIU MANY*  WBCU 21-50  RBCU 7-10  BACTERIA MANY*  OTHERU FEW YEAST     Assessment & Plan by Problem:  1.  Right gluteal and right labial abscess, status post irrigation and debridement by Dr. Rosendo Gros.  Plan is continue broad-spectrum IV antibiotics pending culture results; wound care as per Dr. Rosendo Gros  2.  DKA.  Patient was initially treated with IV insulin per protocol and normal  saline IV volume replacement; her DKA corrected and she was transitioned to subcutaneous insulin.  She is taking PO without problems.  Plan is followed blood sugars and adjust insulin regimen as indicated.  3.  Other problems and plans as per the resident physician's note.

## 2013-09-20 NOTE — Progress Notes (Signed)
1 Day Post-Op  Subjective: Pt with no acute events overnight  Objective: Vital signs in last 24 hours: Temp:  [97.9 F (36.6 C)-100.7 F (38.2 C)] 98.5 F (36.9 C) (05/24 0455) Pulse Rate:  [114-137] 114 (05/24 0455) Resp:  [12-28] 20 (05/24 0455) BP: (104-137)/(61-89) 107/70 mmHg (05/24 0455) SpO2:  [97 %-100 %] 100 % (05/24 0455) Weight:  [110 lb (49.896 kg)-114 lb 6.7 oz (51.9 kg)] 113 lb 12.1 oz (51.6 kg) (05/24 0455) Last BM Date: 09/18/13  Intake/Output from previous day: 05/23 0701 - 05/24 0700 In: 3775 [I.V.:1425; IV Piggyback:2350] Out: 650 [Urine:600; Blood:50] Intake/Output this shift: Total I/O In: 3775 [I.V.:1425; IV Piggyback:2350] Out: 650 [Urine:600; Blood:50]  General appearance: alert and cooperative Resp: clear to auscultation bilaterally Cardio: regular rate and rhythm, S1, S2 normal, no murmur, click, rub or gallop GI: soft, non-tender; bowel sounds normal; no masses,  no organomegaly gluteal and labial wounds packed  Lab Results:   Recent Labs  09/19/13 1830 09/20/13 0320  WBC 19.3* 20.4*  HGB 9.0* 10.0*  HCT 26.6* 30.6*  PLT 488* 486*   BMET  Recent Labs  09/19/13 2247 09/20/13 0320  NA 132* 135*  136*  K 3.9 4.0  4.0  CL 97 103  102  CO2 21 20  21   GLUCOSE 324* 163*  163*  BUN 6 4*  4*  CREATININE 0.57 0.50  0.50  CALCIUM 8.5 8.1*  8.0*   PT/INR No results found for this basename: LABPROT, INR,  in the last 72 hours ABG No results found for this basename: PHART, PCO2, PO2, HCO3,  in the last 72 hours  Studies/Results: No results found.  Anti-infectives: Anti-infectives   Start     Dose/Rate Route Frequency Ordered Stop   09/19/13 2230  ceFEPIme (MAXIPIME) 1 g in dextrose 5 % 50 mL IVPB     1 g 100 mL/hr over 30 Minutes Intravenous Every 12 hours 09/19/13 2225     09/19/13 2230  vancomycin (VANCOCIN) 500 mg in sodium chloride 0.9 % 100 mL IVPB     500 mg 100 mL/hr over 60 Minutes Intravenous Every 8 hours  09/19/13 2226     09/19/13 1800  clindamycin (CLEOCIN) IVPB 600 mg     600 mg 100 mL/hr over 30 Minutes Intravenous  Once 09/19/13 1755 09/19/13 2054   09/19/13 0000  clindamycin (CLEOCIN) 150 MG capsule     150 mg Oral Every 6 hours 09/19/13 1558        Assessment/Plan: s/p Procedure(s): IRRIGATION AND DEBRIDEMENT RIGHT GLUTEAL AND LABIAL ABSCESSES (Right)  Began BID dressing changes with WTD guaze to each wound con't abx per primary team   LOS: 1 day    Ralene Ok 09/20/2013

## 2013-09-21 DIAGNOSIS — E1110 Type 2 diabetes mellitus with ketoacidosis without coma: Secondary | ICD-10-CM

## 2013-09-21 LAB — GLUCOSE, CAPILLARY
GLUCOSE-CAPILLARY: 351 mg/dL — AB (ref 70–99)
GLUCOSE-CAPILLARY: 182 mg/dL — AB (ref 70–99)
Glucose-Capillary: 252 mg/dL — ABNORMAL HIGH (ref 70–99)
Glucose-Capillary: 206 mg/dL — ABNORMAL HIGH (ref 70–99)
Glucose-Capillary: 270 mg/dL — ABNORMAL HIGH (ref 70–99)

## 2013-09-21 LAB — BASIC METABOLIC PANEL
BUN: 4 mg/dL — ABNORMAL LOW (ref 6–23)
CHLORIDE: 100 meq/L (ref 96–112)
CO2: 20 meq/L (ref 19–32)
CREATININE: 0.57 mg/dL (ref 0.50–1.10)
Calcium: 8.6 mg/dL (ref 8.4–10.5)
GFR calc Af Amer: >90 mL/min (ref >90)
GFR calc non Af Amer: >90 mL/min (ref >90)
Glucose, Bld: 378 mg/dL — ABNORMAL HIGH (ref 70–99)
Potassium: 4.3 mEq/L (ref 3.7–5.3)
Sodium: 133 mEq/L — ABNORMAL LOW (ref 137–147)

## 2013-09-21 LAB — CBC WITH DIFFERENTIAL/PLATELET
Basophils Absolute: 0.1 10*3/uL (ref 0.0–0.1)
Basophils Relative: 0 % (ref 0–1)
Eosinophils Absolute: 0.2 10*3/uL (ref 0.0–0.7)
Eosinophils Relative: 1 % (ref 0–5)
HEMATOCRIT: 30.2 % — AB (ref 36.0–46.0)
HEMOGLOBIN: 10.1 g/dL — AB (ref 12.0–15.0)
LYMPHS ABS: 3.2 10*3/uL (ref 0.7–4.0)
LYMPHS PCT: 14 % (ref 12–46)
MCH: 27.2 pg (ref 26.0–34.0)
MCHC: 33.4 g/dL (ref 30.0–36.0)
MCV: 81.4 fL (ref 78.0–100.0)
MONO ABS: 1.9 10*3/uL — AB (ref 0.1–1.0)
Monocytes Relative: 9 % (ref 3–12)
Neutro Abs: 17.4 10*3/uL — ABNORMAL HIGH (ref 1.7–7.7)
Neutrophils Relative %: 76 % (ref 43–77)
Platelets: 506 10*3/uL — ABNORMAL HIGH (ref 150–400)
RBC: 3.71 MIL/uL — AB (ref 3.87–5.11)
RDW: 13.8 % (ref 11.5–15.5)
WBC: 22.8 10*3/uL — AB (ref 4.0–10.5)

## 2013-09-21 LAB — URINE CULTURE: Colony Count: >=100000

## 2013-09-21 LAB — VANCOMYCIN, TROUGH

## 2013-09-21 MED ORDER — MORPHINE SULFATE 2 MG/ML IJ SOLN
2.0000 mg | Freq: Once | INTRAMUSCULAR | Status: AC
  Administered 2013-09-21: 2 mg via INTRAVENOUS

## 2013-09-21 MED ORDER — VANCOMYCIN HCL IN DEXTROSE 1-5 GM/200ML-% IV SOLN
1000.0000 mg | Freq: Once | INTRAVENOUS | Status: AC
  Administered 2013-09-21: 1000 mg via INTRAVENOUS
  Filled 2013-09-21: qty 200

## 2013-09-21 MED ORDER — VANCOMYCIN HCL IN DEXTROSE 750-5 MG/150ML-% IV SOLN
750.0000 mg | Freq: Three times a day (TID) | INTRAVENOUS | Status: DC
Start: 2013-09-21 — End: 2013-09-22
  Administered 2013-09-21 – 2013-09-22 (×3): 750 mg via INTRAVENOUS
  Filled 2013-09-21 (×5): qty 150

## 2013-09-21 MED ORDER — VANCOMYCIN HCL IN DEXTROSE 750-5 MG/150ML-% IV SOLN
750.0000 mg | Freq: Three times a day (TID) | INTRAVENOUS | Status: DC
  Filled 2013-09-21 (×3): qty 150

## 2013-09-21 NOTE — Progress Notes (Addendum)
Internal Medicine On-Call Attending  Date: 09/21/2013  Patient name: Ashley Freeman Medical record number: [id number] Date of birth: 04-03-1991 Age: 23 y.o. Gender: female  I saw and evaluated the patient. I discussed patient and reviewed the resident's note by Dr. Redmond Pulling, and I agree with the resident's findings and plans as documented in her note.   Dr. Beryle Beams will take over as attending physician tomorrow 09/23/2011.

## 2013-09-21 NOTE — Progress Notes (Signed)
Inpatient Diabetes Program Recommendations  AACE/ADA: New Consensus Statement on Inpatient Glycemic Control (2013)  Target Ranges:  Prepandial:   less than 140 mg/dL      Peak postprandial:   less than 180 mg/dL (1-2 hours)      Critically ill patients:  140 - 180 mg/dL   Reason for Visit: Hyperglycemia  Diabetes history: DM1 Outpatient Diabetes medications: Lantus 20 units QAM and Humalog 3 units tidwc Current orders for Inpatient glycemic control: Lantus 20 units QAM and Novolog moderate tidwc and hs  Results for PARA, ASTI (MRN [id number]) as of 09/21/2013 11:15  Ref. Range 09/20/2013 16:41 09/20/2013 21:50 09/21/2013 00:50 09/21/2013 07:29  Glucose-Capillary Latest Range: 70-99 mg/dL 229 (H) 298 (H) 252 (H) 351 (H)  Results for EZARIA, MELTZER (MRN [id number]) as of 09/21/2013 11:15  Ref. Range 09/21/2013 04:30  Sodium Latest Range: 137-147 mEq/L 133 (L)  Potassium Latest Range: 3.7-5.3 mEq/L 4.3  Chloride Latest Range: 96-112 mEq/L 100  CO2 Latest Range: 19-32 mEq/L 20  BUN Latest Range: 6-23 mg/dL 4 (L)  Creatinine Latest Range: 0.50-1.10 mg/dL 0.57  Calcium Latest Range: 8.4-10.5 mg/dL 8.6  GFR calc non Af Amer Latest Range: >90 mL/min >90  GFR calc Af Amer Latest Range: >90 mL/min >90  Glucose Latest Range: 70-99 mg/dL 378 (H)    Inpatient Diabetes Program Recommendations Insulin - Basal: Increase Lantus to 25 units QAM Insulin - Meal Coverage: Add Novolog 4 units tidwc for meal coverage insulin if pt eats >50% meal HgbA1C: 19.3% - uncontrolled.  Thank you. Lorenda Peck, RD, LDN, CDE Inpatient Diabetes Coordinator [phone]

## 2013-09-21 NOTE — Progress Notes (Addendum)
ANTIBIOTIC CONSULT NOTE - Follow Up  Pharmacy Consult for vancomycin Indication: rule out sepsis  Allergies  Allergen Reactions  . Penicillins Hives    Patient Measurements: Height: 5\' 3"  (160 cm) Weight: 114 lb 10.2 oz (52 kg) IBW/kg (Calculated) : 52.4   Vital Signs: Temp: 99.6 F (37.6 C) (05/25 1200) Temp src: Oral (05/25 1200) BP: 106/64 mmHg (05/25 0733) Pulse Rate: 104 (05/25 0733) Intake/Output from previous day: 05/24 0701 - 05/25 0700 In: 2470 [P.O.:945; I.V.:1375; IV Piggyback:150] Out: 2125 [Urine:2125] Intake/Output from this shift: Total I/O In: 125 [I.V.:125] Out: 3450 [Urine:3450]  Labs:  Recent Labs  09/19/13 1830  09/20/13 0320 09/20/13 1333 09/20/13 2023 09/21/13 0430  WBC 19.3*  --  20.4*  --   --  22.8*  HGB 9.0*  --  10.0*  --   --  10.1*  PLT 488*  --  486*  --   --  506*  CREATININE 0.58  < > 0.50  0.50 0.57 0.61 0.57  < > = values in this interval not displayed. Estimated Creatinine Clearance: 91.3 ml/min (by C-G formula based on Cr of 0.57).  Recent Labs  09/21/13 1400  VANCOTROUGH <5.0*     Microbiology: Recent Results (from the past 720 hour(s))  WET PREP, GENITAL     Status: Abnormal   Collection Time    09/15/13  3:39 PM      Result Value Ref Range Status   Yeast Wet Prep HPF POC NONE SEEN  NONE SEEN Final   Trich, Wet Prep MODERATE (*) NONE SEEN Final   Clue Cells Wet Prep HPF POC FEW (*) NONE SEEN Final   WBC, Wet Prep HPF POC MODERATE (*) NONE SEEN Final  GC/CHLAMYDIA PROBE AMP     Status: None   Collection Time    09/15/13  3:39 PM      Result Value Ref Range Status   CT Probe RNA NEGATIVE  NEGATIVE Final   GC Probe RNA NEGATIVE  NEGATIVE Final   Comment: (NOTE)                                                                                               **Normal Reference Range: Negative**          Assay performed using the Gen-Probe APTIMA COMBO2 (R) Assay.     Acceptable specimen types for this assay  include APTIMA Swabs (Unisex,     endocervical, urethral, or vaginal), first void urine, and ThinPrep     liquid based cytology samples.     Performed at [facility]     Status: None   Collection Time    09/19/13  6:46 PM      Result Value Ref Range Status   Specimen Description URINE, CLEAN CATCH   Final   Special Requests NONE   Final   Culture  Setup Time     Final   Value: 09/20/2013 03:48     Performed at [place]     Final   Value: >=100,000 COLONIES/ML  Performed at [facility]     Final   Value: LACTOBACILLUS SPECIES     Note: Standardized susceptibility testing for this organism is not available.     Performed at [facility]   Report Status 09/21/2013 FINAL   Final  WOUND CULTURE     Status: None   Collection Time    09/19/13  8:50 PM      Result Value Ref Range Status   Specimen Description PERIRECTAL   Final   Special Requests NONE   Final   Gram Stain     Final   Value: RARE WBC PRESENT,BOTH PMN AND MONONUCLEAR     NO SQUAMOUS EPITHELIAL CELLS SEEN     RARE GRAM POSITIVE COCCI     IN CHAINS RARE GRAM NEGATIVE RODS     Performed at [facility]   Culture PENDING   Incomplete   Report Status PENDING   Incomplete  MRSA PCR SCREENING     Status: None   Collection Time    09/19/13 10:01 PM      Result Value Ref Range Status   MRSA by PCR NEGATIVE  NEGATIVE Final   Comment:            The GeneXpert MRSA Assay (FDA     approved for NASAL specimens     only), is one component of a     comprehensive MRSA colonization     surveillance program. It is not     intended to diagnose MRSA     infection nor to guide or     monitor treatment for     MRSA infections.  CULTURE, BLOOD (ROUTINE X 2)     Status: None   Collection Time    09/19/13 10:47 PM      Result Value Ref Range Status   Specimen Description BLOOD RIGHT ANTECUBITAL   Final   Special Requests BOTTLES DRAWN AEROBIC  ONLY 5CC   Final   Culture  Setup Time     Final   Value: 09/20/2013 03:14     Performed at [facility]   Culture     Final   Value:        BLOOD CULTURE RECEIVED NO GROWTH TO DATE CULTURE WILL BE HELD FOR 5 DAYS BEFORE ISSUING A FINAL NEGATIVE REPORT     Performed at [facility]   Report Status PENDING   Incomplete  CULTURE, BLOOD (ROUTINE X 2)     Status: None   Collection Time    09/19/13 10:57 PM      Result Value Ref Range Status   Specimen Description BLOOD RIGHT HAND   Final   Special Requests BOTTLES DRAWN AEROBIC ONLY 5CC   Final   Culture  Setup Time     Final   Value: 09/20/2013 03:14     Performed at [facility]   Culture     Final   Value:        BLOOD CULTURE RECEIVED NO GROWTH TO DATE CULTURE WILL BE HELD FOR 5 DAYS BEFORE ISSUING A FINAL NEGATIVE REPORT     Performed at [facility]   Report Status PENDING   Incomplete  ANAEROBIC CULTURE     Status: None   Collection Time    09/20/13  1:22 AM      Result Value Ref Range Status   Specimen Description ABSCESS RIGHT LABIA   Final   Special Requests  PATIENT ON FOLLOWING VANCOMYCIN,CEFAPIME   Final   Gram Stain     Final   Value: NO WBC SEEN     NO SQUAMOUS EPITHELIAL CELLS SEEN     RARE GRAM POSITIVE COCCI     IN PAIRS     Performed at [facility]   Culture     Final   Value: NO ANAEROBES ISOLATED; CULTURE IN PROGRESS FOR 5 DAYS     Performed at [facility]   Report Status PENDING   Incomplete  CULTURE, ROUTINE-ABSCESS     Status: None   Collection Time    09/20/13  1:22 AM      Result Value Ref Range Status   Specimen Description ABSCESS RIGHT LABIA   Final   Special Requests PATIENT ON FOLLOWING VANCOMYCIN,CEFAPIME   Final   Gram Stain     Final   Value: NO WBC SEEN     NO SQUAMOUS EPITHELIAL CELLS SEEN     RARE GRAM POSITIVE COCCI     IN PAIRS     Performed at [facility]   Culture     Final   Value: Culture reincubated for better growth      Performed at [facility]   Report Status PENDING   Incomplete    Medical History: Past Medical History  Diagnosis Date  . Gonorrhea 08/2011    Treated in 09/2011  . Diabetes mellitus 2001    Diagnosed at age 3 ; Type I  . DKA (diabetic ketoacidoses) 08/19/2013    Medications:  Prescriptions prior to admission  Medication Sig Dispense Refill  . insulin glargine (LANTUS) 100 unit/mL SOPN Inject 0.2 mLs (20 Units total) into the skin every morning.  15 mL  11  . insulin lispro (HUMALOG KWIKPEN) 100 UNIT/ML KiwkPen Inject 0.03 mLs (3 Units total) into the skin 3 (three) times daily.  15 mL  11   Assessment: 23 year old female who continues on vancomycin and cefepime for sepsis likely due to perirectal abscess, s/p I&D. Leukocytosis continues (WBC 22.8), and patient remains afebrile, but tachycardic in the 100s.  Vancomycin trough this afternoon, seemingly drawn appropriately, is subtherapeutic at < 5.   Renal function is stable: SCr 0.57, UOP 1.7, CrCl ~90 mL/min.   5/23 Blood >> ngtd 5/23 wound >> re-incubated 5/23 urine >> lactobacillus  Goal of Therapy:  Vancomycin trough level 15-20 mcg/ml  Plan:  Give vancomycin 1000 mg IV x 1 dose now, followed by 750 mg q8h Check VT at steady state F/u culture, sensitivities Follow fever curve, clinical progression  Bhavesh Vazquez C. Ginevra Tacker, PharmD Clinical Pharmacist-Resident Pager: [phone] Pharmacy: [phone] 09/21/2013 3:46 PM

## 2013-09-21 NOTE — Progress Notes (Signed)
Subjective: No acute events overnight.  Gluteal/vaginal pain is well controlled.  No fever/chills.  Appetite is good.  Objective: Vital signs in last 24 hours: Filed Vitals:   09/20/13 1910 09/21/13 0052 09/21/13 0301 09/21/13 0733  BP: 116/77 104/65 115/76   Pulse: 123 121 104   Temp: 98.2 F (36.8 C) 98.3 F (36.8 C) 98.2 F (36.8 C) 98.6 F (37 C)  TempSrc: Axillary Oral Oral Oral  Resp: 19 22 29    Height:      Weight:   52 kg (114 lb 10.2 oz)   SpO2: 100% 97% 99%    Weight change: 2.104 kg (4 lb 10.2 oz)  Intake/Output Summary (Last 24 hours) at 09/21/13 0808 Last data filed at 09/21/13 [id number]  Gross per 24 hour  Intake   2345 ml  Output   3925 ml  Net  -1580 ml   General: resting in bed in NAD HEENT: no gross abnormality Cardiac: RRR, no rubs, murmurs or gallops Pulm:  CTA B/L, moving normal volumes of air Abd: soft, nontender, nondistended, BS present Ext: warm and well perfused, no pedal edema Neuro: alert and oriented X3, responding appropriately GU:  ABD pad covering gluteal/labial surgical sites  Lab Results: Basic Metabolic Panel:  Recent Labs Lab 09/19/13 2247 09/20/13 0320  09/20/13 2023 09/21/13 0430  NA 132* 135*  136*  < > 135* 133*  K 3.9 4.0  4.0  < > 4.0 4.3  CL 97 103  102  < > 102 100  CO2 21 20  21   < > 22 20  GLUCOSE 324* 163*  163*  < > 298* 378*  BUN 6 4*  4*  < > 5* 4*  CREATININE 0.57 0.50  0.50  < > 0.61 0.57  CALCIUM 8.5 8.1*  8.0*  < > 8.1* 8.6  MG 1.7  --   --   --   --   PHOS 2.2* 2.3  --   --   --   < > = values in this interval not displayed. Liver Function Tests:  Recent Labs Lab 09/19/13 1830  AST 10  ALT <5  ALKPHOS 138*  BILITOT 0.2*  PROT 6.6  ALBUMIN 2.7*   CBC:  Recent Labs Lab 09/19/13 1830 09/20/13 0320 09/21/13 0430  WBC 19.3* 20.4* 22.8*  NEUTROABS 16.2*  --  17.4*  HGB 9.0* 10.0* 10.1*  HCT 26.6* 30.6* 30.2*  MCV 78.5 80.3 81.4  PLT 488* 486* 506*   CBG:  Recent Labs Lab  09/20/13 1019 09/20/13 1141 09/20/13 1641 09/20/13 2150 09/21/13 0050 09/21/13 0729  GLUCAP 149* 171* 229* 298* 252* 351*   Urine Drug Screen: Drugs of Abuse     Component Value Date/Time   LABOPIA NONE DETECTED 08/09/2013 0005   LABOPIA NEGATIVE 12/18/2009 1453   COCAINSCRNUR NONE DETECTED 08/09/2013 0005   COCAINSCRNUR NEGATIVE 12/18/2009 1453   LABBENZ NONE DETECTED 08/09/2013 0005   LABBENZ NEGATIVE 12/18/2009 1453   AMPHETMU NONE DETECTED 08/09/2013 0005   AMPHETMU NEGATIVE 12/18/2009 1453   THCU NONE DETECTED 08/09/2013 0005   LABBARB NONE DETECTED 08/09/2013 0005    Urinalysis:  Recent Labs Lab 09/19/13 1846 09/20/13 1130  COLORURINE STRAW* YELLOW  LABSPEC 1.036* 1.020  PHURINE 6.0 6.0  GLUCOSEU >1000* >1000*  HGBUR LARGE* NEGATIVE  BILIRUBINUR NEGATIVE NEGATIVE  KETONESUR 15* NEGATIVE  PROTEINUR NEGATIVE NEGATIVE  UROBILINOGEN 0.2 0.2  NITRITE NEGATIVE NEGATIVE  LEUKOCYTESUR MODERATE* NEGATIVE    Micro Results: Recent Results (from the past  240 hour(s))  WET PREP, GENITAL     Status: Abnormal   Collection Time    09/15/13  3:39 PM      Result Value Ref Range Status   Yeast Wet Prep HPF POC NONE SEEN  NONE SEEN Final   Trich, Wet Prep MODERATE (*) NONE SEEN Final   Clue Cells Wet Prep HPF POC FEW (*) NONE SEEN Final   WBC, Wet Prep HPF POC MODERATE (*) NONE SEEN Final  GC/CHLAMYDIA PROBE AMP     Status: None   Collection Time    09/15/13  3:39 PM      Result Value Ref Range Status   CT Probe RNA NEGATIVE  NEGATIVE Final   GC Probe RNA NEGATIVE  NEGATIVE Final   Comment: (NOTE)                                                                                               **Normal Reference Range: Negative**          Assay performed using the Gen-Probe APTIMA COMBO2 (R) Assay.     Acceptable specimen types for this assay include APTIMA Swabs (Unisex,     endocervical, urethral, or vaginal), first void urine, and ThinPrep     liquid based cytology samples.      Performed at [facility] PCR SCREENING     Status: None   Collection Time    09/19/13 10:01 PM      Result Value Ref Range Status   MRSA by PCR NEGATIVE  NEGATIVE Final   Comment:            The GeneXpert MRSA Assay (FDA     approved for NASAL specimens     only), is one component of a     comprehensive MRSA colonization     surveillance program. It is not     intended to diagnose MRSA     infection nor to guide or     monitor treatment for     MRSA infections.  CULTURE, BLOOD (ROUTINE X 2)     Status: None   Collection Time    09/19/13 10:47 PM      Result Value Ref Range Status   Specimen Description BLOOD RIGHT ANTECUBITAL   Final   Special Requests BOTTLES DRAWN AEROBIC ONLY 5CC   Final   Culture  Setup Time     Final   Value: 09/20/2013 03:14     Performed at [facility]   Culture     Final   Value:        BLOOD CULTURE RECEIVED NO GROWTH TO DATE CULTURE WILL BE HELD FOR 5 DAYS BEFORE ISSUING A FINAL NEGATIVE REPORT     Performed at [facility]   Report Status PENDING   Incomplete  CULTURE, BLOOD (ROUTINE X 2)     Status: None   Collection Time    09/19/13 10:57 PM      Result Value Ref Range Status   Specimen Description BLOOD RIGHT HAND   Final   Special Requests BOTTLES DRAWN AEROBIC  ONLY 5CC   Final   Culture  Setup Time     Final   Value: 09/20/2013 03:14     Performed at [facility]   Culture     Final   Value:        BLOOD CULTURE RECEIVED NO GROWTH TO DATE CULTURE WILL BE HELD FOR 5 DAYS BEFORE ISSUING A FINAL NEGATIVE REPORT     Performed at [facility]   Report Status PENDING   Incomplete   Medications: I have reviewed the patient's current medications. Scheduled Meds: . ceFEPime (MAXIPIME) IV  1 g Intravenous Q12H  . insulin aspart  0-15 Units Subcutaneous TID WC  . insulin aspart  0-5 Units Subcutaneous QHS  . insulin glargine  20 Units Subcutaneous Daily  . vancomycin  500 mg Intravenous Q8H    Continuous Infusions: . sodium chloride 1,000 mL (09/21/13 0600)   PRN Meds:.acetaminophen, dextrose, diphenhydrAMINE, morphine injection, ondansetron (ZOFRAN) IV, oxyCODONE-acetaminophen Assessment/Plan: Sepsis 2/2 right labial abscess and persistent gluteal abscess: Patient meets 2/4 SIRS criteria with leucocytosis to 19, tachycardia to 120s; afebrile, no tachypnea. Lactic acid elevated to 2.9, now 1.6.  Surgery consulted and appreciate surgical intervention.  Patient is s/p I&D of both abscesses in OR.  Pain is well controlled.  Urine culture + lactobacillus. - continue vancomycin, cefepime per pharmacy (patient has penicillin allergy)  - morphine IV 2 mg q4h prn  - BID wet-to-dry gauze changes to each wound  - wound culture pending - currently with rare gram + cocci and gram - rods - blood culture pending - NGTD - monitor CBC and for s&s of infection  DKA in setting of uncontrolled DM1, resolved: A1C 19.3%. On admission, glucose 705-->163, AG 19-->12, bicarb 19, + ketonuria. Lactic acid 2.9-->1.6. Leucocytosis to 19, DKA very likely precipitated by infection per above. Patient is s/p NS 3L in ED.  Off insulin gtt and transitioned to [facility]. - continue Lantus 20 daily and SSI moderate - continue D5-0.5NSS  - monitor CBG  Diet:  Carb mod VTE ppx: Heparin [place] Code:  Full  Dispo: Disposition is deferred at this time, awaiting improvement of current medical problems.  Anticipated discharge in approximately 2-3 day(s).   The patient does have a current PCP Cresenciano Genre, MD) and does need an [facility] hospital follow-up appointment after discharge.  The patient does not know have transportation limitations that hinder transportation to clinic appointments.  .Services Needed at time of discharge: Y = Yes, Blank = No PT:   OT:   RN:   Equipment:   Other:     LOS: 2 days   Duwaine Maxin, DO 09/21/2013, 8:08 AM

## 2013-09-21 NOTE — Progress Notes (Signed)
2 Days Post-Op  Subjective: Pt doing well. BG still elevated tol dressing changes  Objective: Vital signs in last 24 hours: Temp:  [98.2 F (36.8 C)-98.9 F (37.2 C)] 98.6 F (37 C) (05/25 0733) Pulse Rate:  [103-123] 104 (05/25 0733) Resp:  [19-29] 23 (05/25 0733) BP: (100-116)/(61-77) 106/64 mmHg (05/25 0733) SpO2:  [95 %-100 %] 95 % (05/25 0733) Weight:  [114 lb 10.2 oz (52 kg)] 114 lb 10.2 oz (52 kg) (05/25 0301) Last BM Date: 09/18/13  Intake/Output from previous day: 05/24 0701 - 05/25 0700 In: 2470 [P.O.:945; I.V.:1375; IV Piggyback:150] Out: 2125 [Urine:2125] Intake/Output this shift: Total I/O In: 125 [I.V.:125] Out: 1800 [Urine:1800]  General appearance: alert and cooperative GI: soft, non-tender; bowel sounds normal; no masses,  no organomegaly Incision/Wound: wounds packed  Lab Results:   Recent Labs  09/20/13 0320 09/21/13 0430  WBC 20.4* 22.8*  HGB 10.0* 10.1*  HCT 30.6* 30.2*  PLT 486* 506*   BMET  Recent Labs  09/20/13 2023 09/21/13 0430  NA 135* 133*  K 4.0 4.3  CL 102 100  CO2 22 20  GLUCOSE 298* 378*  BUN 5* 4*  CREATININE 0.61 0.57  CALCIUM 8.1* 8.6    Anti-infectives: Anti-infectives   Start     Dose/Rate Route Frequency Ordered Stop   09/19/13 2230  ceFEPIme (MAXIPIME) 1 g in dextrose 5 % 50 mL IVPB     1 g 100 mL/hr over 30 Minutes Intravenous Every 12 hours 09/19/13 2225     09/19/13 2230  vancomycin (VANCOCIN) 500 mg in sodium chloride 0.9 % 100 mL IVPB     500 mg 100 mL/hr over 60 Minutes Intravenous Every 8 hours 09/19/13 2226     09/19/13 1800  clindamycin (CLEOCIN) IVPB 600 mg     600 mg 100 mL/hr over 30 Minutes Intravenous  Once 09/19/13 1755 09/19/13 2054   09/19/13 0000  clindamycin (CLEOCIN) 150 MG capsule     150 mg Oral Every 6 hours 09/19/13 1558        Assessment/Plan: s/p Procedure(s): IRRIGATION AND DEBRIDEMENT RIGHT GLUTEAL AND LABIAL ABSCESSES (Right) Micro pending- con't abx BID dressing  changes Mobilize   LOS: 2 days    Ralene Ok 09/21/2013

## 2013-09-22 ENCOUNTER — Encounter (HOSPITAL_COMMUNITY): Payer: Self-pay | Admitting: General Surgery

## 2013-09-22 DIAGNOSIS — E1010 Type 1 diabetes mellitus with ketoacidosis without coma: Secondary | ICD-10-CM

## 2013-09-22 DIAGNOSIS — N764 Abscess of vulva: Secondary | ICD-10-CM

## 2013-09-22 DIAGNOSIS — A419 Sepsis, unspecified organism: Secondary | ICD-10-CM

## 2013-09-22 LAB — BASIC METABOLIC PANEL
BUN: 3 mg/dL — ABNORMAL LOW (ref 6–23)
CALCIUM: 8.6 mg/dL (ref 8.4–10.5)
CO2: 24 mEq/L (ref 19–32)
CREATININE: 0.46 mg/dL — AB (ref 0.50–1.10)
Chloride: 102 mEq/L (ref 96–112)
GFR calc Af Amer: >90 mL/min (ref >90)
GFR calc non Af Amer: >90 mL/min (ref >90)
GLUCOSE: 287 mg/dL — AB (ref 70–99)
Potassium: 4 mEq/L (ref 3.7–5.3)
SODIUM: 136 meq/L — AB (ref 137–147)

## 2013-09-22 LAB — CBC WITH DIFFERENTIAL/PLATELET
BASOS ABS: 0 10*3/uL (ref 0.0–0.1)
Basophils Relative: 0 % (ref 0–1)
EOS PCT: 2 % (ref 0–5)
Eosinophils Absolute: 0.5 10*3/uL (ref 0.0–0.7)
HEMATOCRIT: 27.2 % — AB (ref 36.0–46.0)
Hemoglobin: 8.9 g/dL — ABNORMAL LOW (ref 12.0–15.0)
LYMPHS ABS: 3.6 10*3/uL (ref 0.7–4.0)
Lymphocytes Relative: 16 % (ref 12–46)
MCH: 26.4 pg (ref 26.0–34.0)
MCHC: 32.7 g/dL (ref 30.0–36.0)
MCV: 80.7 fL (ref 78.0–100.0)
Monocytes Absolute: 1.6 10*3/uL — ABNORMAL HIGH (ref 0.1–1.0)
Monocytes Relative: 7 % (ref 3–12)
NEUTROS ABS: 17 10*3/uL — AB (ref 1.7–7.7)
Neutrophils Relative %: 75 % (ref 43–77)
PLATELETS: 415 10*3/uL — AB (ref 150–400)
RBC: 3.37 MIL/uL — ABNORMAL LOW (ref 3.87–5.11)
RDW: 13.8 % (ref 11.5–15.5)
WBC: 22.7 10*3/uL — ABNORMAL HIGH (ref 4.0–10.5)

## 2013-09-22 LAB — GLUCOSE, CAPILLARY
GLUCOSE-CAPILLARY: 201 mg/dL — AB (ref 70–99)
Glucose-Capillary: 253 mg/dL — ABNORMAL HIGH (ref 70–99)
Glucose-Capillary: 127 mg/dL — ABNORMAL HIGH (ref 70–99)
Glucose-Capillary: 208 mg/dL — ABNORMAL HIGH (ref 70–99)

## 2013-09-22 LAB — WOUND CULTURE: Culture: NORMAL

## 2013-09-22 LAB — VANCOMYCIN, TROUGH: Vancomycin Tr: 7.7 ug/mL — ABNORMAL LOW (ref 10.0–20.0)

## 2013-09-22 MED ORDER — VANCOMYCIN HCL IN DEXTROSE 1-5 GM/200ML-% IV SOLN
1000.0000 mg | Freq: Four times a day (QID) | INTRAVENOUS | Status: DC
  Administered 2013-09-22 – 2013-09-24 (×7): 1000 mg via INTRAVENOUS
  Filled 2013-09-22 (×11): qty 200

## 2013-09-22 MED ORDER — INSULIN GLARGINE 100 UNIT/ML ~~LOC~~ SOLN
25.0000 [IU] | Freq: Every day | SUBCUTANEOUS | Status: DC
  Administered 2013-09-23 – 2013-09-25 (×3): 25 [IU] via SUBCUTANEOUS
  Filled 2013-09-22 (×6): qty 0.25

## 2013-09-22 NOTE — Progress Notes (Signed)
Subjective: No acute events overnight.  She says she is aggravated but will not give me details.  Gluteal/vaginal pain is well controlled.  Appetite is good.  Objective: Vital signs in last 24 hours: Filed Vitals:   09/21/13 2323 09/22/13 0303 09/22/13 0307 09/22/13 0707  BP: 112/61  116/70 118/69  Pulse: 109  103 119  Temp: 98.7 F (37.1 C)  99.2 F (37.3 C) 98.1 F (36.7 C)  TempSrc: Oral  Oral Oral  Resp: 30  26 23   Height:      Weight:  55.4 kg (122 lb 2.2 oz)    SpO2: 95%  92% 99%   Weight change: 3.4 kg (7 lb 7.9 oz)  Intake/Output Summary (Last 24 hours) at 09/22/13 0943 Last data filed at 09/22/13 0800  Gross per 24 hour  Intake   3205 ml  Output   5100 ml  Net  -1895 ml   General: resting in bed in NAD, eating lunch HEENT: no gross abnormality Cardiac: RRR, no rubs, murmurs or gallops Pulm:  CTA B/L, moving normal volumes of air Abd: soft, nontender, nondistended, BS present Neuro: alert and oriented X3, responding appropriately  Lab Results: Basic Metabolic Panel:  Recent Labs Lab 09/19/13 2247 09/20/13 0320  09/21/13 0430 09/22/13 0333  NA 132* 135*  136*  < > 133* 136*  K 3.9 4.0  4.0  < > 4.3 4.0  CL 97 103  102  < > 100 102  CO2 21 20  21   < > 20 24  GLUCOSE 324* 163*  163*  < > 378* 287*  BUN 6 4*  4*  < > 4* 3*  CREATININE 0.57 0.50  0.50  < > 0.57 0.46*  CALCIUM 8.5 8.1*  8.0*  < > 8.6 8.6  MG 1.7  --   --   --   --   PHOS 2.2* 2.3  --   --   --   < > = values in this interval not displayed. Liver Function Tests:  Recent Labs Lab 09/19/13 1830  AST 10  ALT <5  ALKPHOS 138*  BILITOT 0.2*  PROT 6.6  ALBUMIN 2.7*   CBC:  Recent Labs Lab 09/21/13 0430 09/22/13 0333  WBC 22.8* 22.7*  NEUTROABS 17.4* 17.0*  HGB 10.1* 8.9*  HCT 30.2* 27.2*  MCV 81.4 80.7  PLT 506* 415*   CBG:  Recent Labs Lab 09/21/13 0050 09/21/13 0729 09/21/13 1249 09/21/13 1631 09/21/13 2126 09/22/13 0817  GLUCAP 252* 351* 182* 206*  270* 253*   Urine Drug Screen: Drugs of Abuse     Component Value Date/Time   LABOPIA NONE DETECTED 08/09/2013 0005   LABOPIA NEGATIVE 12/18/2009 1453   COCAINSCRNUR NONE DETECTED 08/09/2013 0005   COCAINSCRNUR NEGATIVE 12/18/2009 1453   LABBENZ NONE DETECTED 08/09/2013 0005   LABBENZ NEGATIVE 12/18/2009 1453   AMPHETMU NONE DETECTED 08/09/2013 0005   AMPHETMU NEGATIVE 12/18/2009 1453   THCU NONE DETECTED 08/09/2013 0005   LABBARB NONE DETECTED 08/09/2013 0005    Urinalysis:  Recent Labs Lab 09/19/13 1846 09/20/13 1130  COLORURINE STRAW* YELLOW  LABSPEC 1.036* 1.020  PHURINE 6.0 6.0  GLUCOSEU >1000* >1000*  HGBUR LARGE* NEGATIVE  BILIRUBINUR NEGATIVE NEGATIVE  KETONESUR 15* NEGATIVE  PROTEINUR NEGATIVE NEGATIVE  UROBILINOGEN 0.2 0.2  NITRITE NEGATIVE NEGATIVE  LEUKOCYTESUR MODERATE* NEGATIVE    Micro Results: Recent Results (from the past 240 hour(s))  WET PREP, GENITAL     Status: Abnormal   Collection Time  09/15/13  3:39 PM      Result Value Ref Range Status   Yeast Wet Prep HPF POC NONE SEEN  NONE SEEN Final   Trich, Wet Prep MODERATE (*) NONE SEEN Final   Clue Cells Wet Prep HPF POC FEW (*) NONE SEEN Final   WBC, Wet Prep HPF POC MODERATE (*) NONE SEEN Final  GC/CHLAMYDIA PROBE AMP     Status: None   Collection Time    09/15/13  3:39 PM      Result Value Ref Range Status   CT Probe RNA NEGATIVE  NEGATIVE Final   GC Probe RNA NEGATIVE  NEGATIVE Final   Comment: (NOTE)                                                                                               **Normal Reference Range: Negative**          Assay performed using the Gen-Probe APTIMA COMBO2 (R) Assay.     Acceptable specimen types for this assay include APTIMA Swabs (Unisex,     endocervical, urethral, or vaginal), first void urine, and ThinPrep     liquid based cytology samples.     Performed at [place]     Status: None   Collection Time    09/19/13  6:46 PM       Result Value Ref Range Status   Specimen Description URINE, CLEAN CATCH   Final   Special Requests NONE   Final   Culture  Setup Time     Final   Value: 09/20/2013 03:48     Performed at [place]     Final   Value: >=100,000 COLONIES/ML     Performed at [facility]   Culture     Final   Value: LACTOBACILLUS SPECIES     Note: Standardized susceptibility testing for this organism is not available.     Performed at [facility]   Report Status 09/21/2013 FINAL   Final  WOUND CULTURE     Status: None   Collection Time    09/19/13  8:50 PM      Result Value Ref Range Status   Specimen Description PERIRECTAL   Final   Special Requests NONE   Final   Gram Stain     Final   Value: RARE WBC PRESENT,BOTH PMN AND MONONUCLEAR     NO SQUAMOUS EPITHELIAL CELLS SEEN     RARE GRAM POSITIVE COCCI     IN CHAINS RARE GRAM NEGATIVE RODS     Performed at [facility]   Culture PENDING   Incomplete   Report Status PENDING   Incomplete  MRSA PCR SCREENING     Status: None   Collection Time    09/19/13 10:01 PM      Result Value Ref Range Status   MRSA by PCR NEGATIVE  NEGATIVE Final   Comment:            The GeneXpert MRSA Assay (FDA     approved for NASAL specimens  only), is one component of a     comprehensive MRSA colonization     surveillance program. It is not     intended to diagnose MRSA     infection nor to guide or     monitor treatment for     MRSA infections.  CULTURE, BLOOD (ROUTINE X 2)     Status: None   Collection Time    09/19/13 10:47 PM      Result Value Ref Range Status   Specimen Description BLOOD RIGHT ANTECUBITAL   Final   Special Requests BOTTLES DRAWN AEROBIC ONLY 5CC   Final   Culture  Setup Time     Final   Value: 09/20/2013 03:14     Performed at [facility]   Culture     Final   Value:        BLOOD CULTURE RECEIVED NO GROWTH TO DATE CULTURE WILL BE HELD FOR 5 DAYS BEFORE ISSUING A FINAL NEGATIVE  REPORT     Performed at [facility]   Report Status PENDING   Incomplete  CULTURE, BLOOD (ROUTINE X 2)     Status: None   Collection Time    09/19/13 10:57 PM      Result Value Ref Range Status   Specimen Description BLOOD RIGHT HAND   Final   Special Requests BOTTLES DRAWN AEROBIC ONLY 5CC   Final   Culture  Setup Time     Final   Value: 09/20/2013 03:14     Performed at [facility]   Culture     Final   Value:        BLOOD CULTURE RECEIVED NO GROWTH TO DATE CULTURE WILL BE HELD FOR 5 DAYS BEFORE ISSUING A FINAL NEGATIVE REPORT     Performed at [facility]   Report Status PENDING   Incomplete  ANAEROBIC CULTURE     Status: None   Collection Time    09/20/13  1:22 AM      Result Value Ref Range Status   Specimen Description ABSCESS RIGHT LABIA   Final   Special Requests PATIENT ON FOLLOWING VANCOMYCIN,CEFAPIME   Final   Gram Stain     Final   Value: NO WBC SEEN     NO SQUAMOUS EPITHELIAL CELLS SEEN     RARE GRAM POSITIVE COCCI     IN PAIRS     Performed at [facility]   Culture     Final   Value: NO ANAEROBES ISOLATED; CULTURE IN PROGRESS FOR 5 DAYS     Performed at [facility]   Report Status PENDING   Incomplete  CULTURE, ROUTINE-ABSCESS     Status: None   Collection Time    09/20/13  1:22 AM      Result Value Ref Range Status   Specimen Description ABSCESS RIGHT LABIA   Final   Special Requests PATIENT ON FOLLOWING VANCOMYCIN,CEFAPIME   Final   Gram Stain     Final   Value: NO WBC SEEN     NO SQUAMOUS EPITHELIAL CELLS SEEN     RARE GRAM POSITIVE COCCI     IN PAIRS     Performed at [facility]   Culture     Final   Value: Culture reincubated for better growth     Performed at [facility]   Report Status PENDING   Incomplete   Medications: I have reviewed the patient's current medications. Scheduled Meds: .  ceFEPime (MAXIPIME) IV  1 g Intravenous Q12H  . insulin aspart  0-15 Units Subcutaneous TID WC    . insulin aspart  0-5 Units Subcutaneous QHS  . insulin glargine  20 Units Subcutaneous Daily  . vancomycin  750 mg Intravenous Q8H   Continuous Infusions: . sodium chloride 1,000 mL (09/22/13 0518)   PRN Meds:.acetaminophen, dextrose, diphenhydrAMINE, morphine injection, ondansetron (ZOFRAN) IV, oxyCODONE-acetaminophen Assessment/Plan: Sepsis 2/2 right labial abscess and persistent gluteal abscess: Patient meets 2/4 SIRS criteria with leucocytosis to 19, tachycardia to 120s; afebrile, no tachypnea. Lactic acid elevated to 2.9, now 1.6.  Surgery consulted and appreciate surgical intervention.  Patient is s/p I&D of both abscesses in OR.  Pain is well controlled.  Urine culture + lactobacillus. - continue vancomycin, cefepime per pharmacy (patient has penicillin allergy)  - morphine IV 2 mg q4h prn  - BID wet-to-dry gauze changes to each wound  - wound culture pending - currently with rare gram + cocci and gram - rods - blood culture pending - NGTD - monitor CBC and for s&s of infection  DKA in setting of uncontrolled DM1, resolved: A1C 19.3%. On admission, glucose 705-->163, AG 19-->12, bicarb 19, + ketonuria. Lactic acid 2.9-->1.6. Leucocytosis to 19, DKA very likely precipitated by infection per above. Patient is s/p NS 3L in ED.  Off insulin gtt and transitioned to [facility]. - transfer SDU --> med-surg - increase Lantus 20 units daily --> 25 units daily; continue SSI moderate - monitor CBG  Diet:  Carb mod VTE ppx: Heparin Superior Code:  Full  Dispo: Disposition is deferred at this time, awaiting improvement of current medical problems.  Anticipated discharge in approximately 2-3 day(s).   The patient does have a current PCP Cresenciano Genre, MD) and does need an [facility] hospital follow-up appointment after discharge.  The patient does not know have transportation limitations that hinder transportation to clinic appointments.  .Services Needed at time of discharge: Y = Yes, Blank = No PT:    OT:   RN:   Equipment:   Other:     LOS: 3 days   Duwaine Maxin, DO 09/22/2013, 9:43 AM

## 2013-09-22 NOTE — Progress Notes (Signed)
ANTIBIOTIC CONSULT NOTE - Follow Up  Pharmacy Consult for vancomycin and cefepime Indication: rule out sepsis  Allergies  Allergen Reactions  . Penicillins Hives    Patient Measurements: Height: 5\' 3"  (160 cm) Weight: 122 lb 2.2 oz (55.4 kg) IBW/kg (Calculated) : 52.4   Vital Signs: Temp: 98.1 F (36.7 C) (05/26 1132) Temp src: Oral (05/26 1132) BP: 120/77 mmHg (05/26 1135) Pulse Rate: 105 (05/26 1135) Intake/Output from previous day: 05/25 0701 - 05/26 0700 In: 3205 [P.O.:480; I.V.:2375; IV Piggyback:350] Out: 6150 [Urine:6150] Intake/Output from this shift: Total I/O In: 1325 [I.V.:1125; IV Piggyback:200] Out: 2650 [Urine:2650]  Labs:  Recent Labs  09/20/13 0320  09/20/13 2023 09/21/13 0430 09/22/13 0333  WBC 20.4*  --   --  22.8* 22.7*  HGB 10.0*  --   --  10.1* 8.9*  PLT 486*  --   --  506* 415*  CREATININE 0.50  0.50  < > 0.61 0.57 0.46*  < > = values in this interval not displayed. Estimated Creatinine Clearance: 92 ml/min (by C-G formula based on Cr of 0.46).  Recent Labs  09/21/13 1400 09/22/13 1330  VANCOTROUGH <5.0* 7.7*     Microbiology: Recent Results (from the past 720 hour(s))  WET PREP, GENITAL     Status: Abnormal   Collection Time    09/15/13  3:39 PM      Result Value Ref Range Status   Yeast Wet Prep HPF POC NONE SEEN  NONE SEEN Final   Trich, Wet Prep MODERATE (*) NONE SEEN Final   Clue Cells Wet Prep HPF POC FEW (*) NONE SEEN Final   WBC, Wet Prep HPF POC MODERATE (*) NONE SEEN Final  GC/CHLAMYDIA PROBE AMP     Status: None   Collection Time    09/15/13  3:39 PM      Result Value Ref Range Status   CT Probe RNA NEGATIVE  NEGATIVE Final   GC Probe RNA NEGATIVE  NEGATIVE Final   Comment: (NOTE)                                                                                               **Normal Reference Range: Negative**          Assay performed using the Gen-Probe APTIMA COMBO2 (R) Assay.     Acceptable specimen types for  this assay include APTIMA Swabs (Unisex,     endocervical, urethral, or vaginal), first void urine, and ThinPrep     liquid based cytology samples.     Performed at [place]     Status: None   Collection Time    09/19/13  6:46 PM      Result Value Ref Range Status   Specimen Description URINE, CLEAN CATCH   Final   Special Requests NONE   Final   Culture  Setup Time     Final   Value: 09/20/2013 03:48     Performed at [place]     Final   Value: >=100,000 COLONIES/ML     Performed at [facility]  Culture     Final   Value: LACTOBACILLUS SPECIES     Note: Standardized susceptibility testing for this organism is not available.     Performed at [facility]   Report Status 09/21/2013 FINAL   Final  WOUND CULTURE     Status: None   Collection Time    09/19/13  8:50 PM      Result Value Ref Range Status   Specimen Description PERIRECTAL   Final   Special Requests NONE   Final   Gram Stain     Final   Value: RARE WBC PRESENT,BOTH PMN AND MONONUCLEAR     NO SQUAMOUS EPITHELIAL CELLS SEEN     RARE GRAM POSITIVE COCCI     IN CHAINS RARE GRAM NEGATIVE RODS     Performed at [facility]   Culture     Final   Value: Multiple Species Consistent With Normal Fecal Flora     Performed at [facility]   Report Status 09/22/2013 FINAL   Final  MRSA PCR SCREENING     Status: None   Collection Time    09/19/13 10:01 PM      Result Value Ref Range Status   MRSA by PCR NEGATIVE  NEGATIVE Final   Comment:            The GeneXpert MRSA Assay (FDA     approved for NASAL specimens     only), is one component of a     comprehensive MRSA colonization     surveillance program. It is not     intended to diagnose MRSA     infection nor to guide or     monitor treatment for     MRSA infections.  CULTURE, BLOOD (ROUTINE X 2)     Status: None   Collection Time    09/19/13 10:47 PM      Result Value Ref Range  Status   Specimen Description BLOOD RIGHT ANTECUBITAL   Final   Special Requests BOTTLES DRAWN AEROBIC ONLY 5CC   Final   Culture  Setup Time     Final   Value: 09/20/2013 03:14     Performed at [facility]   Culture     Final   Value:        BLOOD CULTURE RECEIVED NO GROWTH TO DATE CULTURE WILL BE HELD FOR 5 DAYS BEFORE ISSUING A FINAL NEGATIVE REPORT     Performed at [facility]   Report Status PENDING   Incomplete  CULTURE, BLOOD (ROUTINE X 2)     Status: None   Collection Time    09/19/13 10:57 PM      Result Value Ref Range Status   Specimen Description BLOOD RIGHT HAND   Final   Special Requests BOTTLES DRAWN AEROBIC ONLY 5CC   Final   Culture  Setup Time     Final   Value: 09/20/2013 03:14     Performed at [facility]   Culture     Final   Value:        BLOOD CULTURE RECEIVED NO GROWTH TO DATE CULTURE WILL BE HELD FOR 5 DAYS BEFORE ISSUING A FINAL NEGATIVE REPORT     Performed at [facility]   Report Status PENDING   Incomplete  ANAEROBIC CULTURE     Status: None   Collection Time    09/20/13  1:22 AM      Result Value Ref Range Status  Specimen Description ABSCESS RIGHT LABIA   Final   Special Requests PATIENT ON FOLLOWING VANCOMYCIN,CEFAPIME   Final   Gram Stain     Final   Value: NO WBC SEEN     NO SQUAMOUS EPITHELIAL CELLS SEEN     RARE GRAM POSITIVE COCCI     IN PAIRS     Performed at [facility]   Culture     Final   Value: NO ANAEROBES ISOLATED; CULTURE IN PROGRESS FOR 5 DAYS     Performed at [facility]   Report Status PENDING   Incomplete  CULTURE, ROUTINE-ABSCESS     Status: None   Collection Time    09/20/13  1:22 AM      Result Value Ref Range Status   Specimen Description ABSCESS RIGHT LABIA   Final   Special Requests PATIENT ON FOLLOWING VANCOMYCIN,CEFAPIME   Final   Gram Stain     Final   Value: NO WBC SEEN     NO SQUAMOUS EPITHELIAL CELLS SEEN     RARE GRAM POSITIVE COCCI     IN PAIRS      Performed at [facility]   Culture     Final   Value: RARE CANDIDA ALBICANS     Performed at [facility]   Report Status PENDING   Incomplete    Medical History: Past Medical History  Diagnosis Date  . Gonorrhea 08/2011    Treated in 09/2011  . Diabetes mellitus 2001    Diagnosed at age 7 ; Type I  . DKA (diabetic ketoacidoses) 08/19/2013    Medications:  Prescriptions prior to admission  Medication Sig Dispense Refill  . insulin glargine (LANTUS) 100 unit/mL SOPN Inject 0.2 mLs (20 Units total) into the skin every morning.  15 mL  11  . insulin lispro (HUMALOG KWIKPEN) 100 UNIT/ML KiwkPen Inject 0.03 mLs (3 Units total) into the skin 3 (three) times daily.  15 mL  11   Assessment: 23 year old female who continues on day #3 vancomycin and cefepime for sepsis likely due to bartholin gland abscess/perirectal abscess now s/p I&D on 5/23. Leukocytosis continues (WBC 22.7), and patient remains afebrile, but tachycardic in the 100s.  Vancomycin trough this afternoon, seemingly drawn appropriately, remains SUBtherapeutic at 7.7 after dose increase.   Renal function is stable: SCr 0.46, UOP 4.6, CrCl ~90 mL/min.   5/24 labia abscess>>rare CA, rare GPC in pairs 5/23 Blood >> ngtd 5/23 perirectal wound >> normal fecal flora 5/23 urine >> lactobacillus   Goal of Therapy:  Vancomycin trough level 15-20 mcg/ml  Plan:  - Increase vancomycin to 1000 mg IV q6h - Check VT at steady state - Continue cefepime 1 gm IV q12h - F/u culture, sensitivities - Follow fever curve, clinical progression  Harolyn Rutherford, PharmD Clinical Pharmacist - Resident Pager: [phone] Pharmacy: [phone] 09/22/2013 4:37 PM

## 2013-09-22 NOTE — Progress Notes (Addendum)
[facility]/Ashley Freeman Progress Note 3 Days Post-Op  Subjective: Patient asleep.  Dressing have recently been changed according to the patient.    Objective: Vital signs in last 24 hours: Temp:  [98.1 F (36.7 C)-99.6 F (37.6 C)] 98.1 F (36.7 C) (05/26 0707) Pulse Rate:  [103-119] 119 (05/26 0707) Resp:  [23-34] 23 (05/26 0707) BP: (112-131)/(61-86) 118/69 mmHg (05/26 0707) SpO2:  [92 %-99 %] 99 % (05/26 0707) Weight:  [55.4 kg (122 lb 2.2 oz)] 55.4 kg (122 lb 2.2 oz) (05/26 0303) Last BM Date: 09/18/13  Intake/Output from previous day: 05/25 0701 - 05/26 0700 In: 3205 [P.O.:480; I.V.:2375; IV Piggyback:350] Out: [id number] [Urine:6150] Intake/Output this shift: Total I/O In: -  Out: 750 [Urine:750]  General: No acute distress.  Lungs: Clear  Abd: Soft, benign  Extremities: No changes  Neuro: Intact  Lab Results:  @LABLAST2 (wbc:2,hgb:2,hct:2,plt:2) BMET  Recent Labs  09/21/13 0430 09/22/13 0333  NA 133* 136*  K 4.3 4.0  CL 100 102  CO2 20 24  GLUCOSE 378* 287*  BUN 4* 3*  CREATININE 0.57 0.46*  CALCIUM 8.6 8.6   PT/INR No results found for this basename: LABPROT, INR,  in the last 72 hours ABG No results found for this basename: PHART, PCO2, PO2, HCO3,  in the last 72 hours  Studies/Results: No results found.  Anti-infectives: Anti-infectives   Start     Dose/Rate Route Frequency Ordered Stop   09/21/13 2200  vancomycin (VANCOCIN) IVPB 750 mg/150 ml premix     750 mg 150 mL/hr over 60 Minutes Intravenous Every 8 hours 09/21/13 1553     09/21/13 1600  vancomycin (VANCOCIN) IVPB 750 mg/150 ml premix  Status:  Discontinued     750 mg 150 mL/hr over 60 Minutes Intravenous Every 8 hours 09/21/13 1548 09/21/13 1553   09/21/13 1600  vancomycin (VANCOCIN) IVPB 1000 mg/200 mL premix     1,000 mg 200 mL/hr over 60 Minutes Intravenous  Once 09/21/13 1553 09/21/13 1700   09/19/13 2230  ceFEPIme (MAXIPIME) 1 g in dextrose 5 % 50 mL IVPB     1 g 100 mL/hr over 30 Minutes  Intravenous Every 12 hours 09/19/13 2225     09/19/13 2230  vancomycin (VANCOCIN) 500 mg in sodium chloride 0.9 % 100 mL IVPB  Status:  Discontinued     500 mg 100 mL/hr over 60 Minutes Intravenous Every 8 hours 09/19/13 2226 09/21/13 1548   09/19/13 1800  clindamycin (CLEOCIN) IVPB 600 mg     600 mg 100 mL/hr over 30 Minutes Intravenous  Once 09/19/13 1755 09/19/13 2054   09/19/13 0000  clindamycin (CLEOCIN) 150 MG capsule     150 mg Oral Every 6 hours 09/19/13 1558        Assessment/Plan: s/p Procedure(s): IRRIGATION AND DEBRIDEMENT RIGHT GLUTEAL AND LABIAL ABSCESSES Continue ABX therapy due to Post-op infection Cultures have not grown any specific bacteria yet.   Patient is on contact precautions, but nurse currently is not present to tell me why.  MRSA screen this admission is negative. May be a candidate for hydrotherapy. Glucose is till very high.  LOS: 3 days   Kathryne Eriksson. Dahlia Bailiff, MD, FACS [phone] [facility] 09/22/2013

## 2013-09-22 NOTE — Progress Notes (Signed)
2030 report called to RN on 6E. 2100 pt transferred to 6E04 via bed accomepied by NT. All belongings went with pt.

## 2013-09-22 NOTE — Progress Notes (Signed)
09/22/2013 1600 NCM spoke to pt and gave permission to speak to mother, Mikki Santee # [id number]. Pt states she had [facility] in the past. Requesting [facility] for [facility]. Waiting final recommendations for dc. Notified [facility] of [place] needed. Jonnie Finner RN CCM Case Mgmt phone [phone]

## 2013-09-22 NOTE — Progress Notes (Signed)
Patient ID: Ashley Freeman, female   DOB: 1990/10/27, 23 y.o.   MRN: [id number] Attending Physician Note: Patient examined.  Physicall findings and management plan accurate as recorded in progress note by resident physician Dr Duwaine Maxin. Discussed status with General Surgeon, Dr Hulen Skains. Known type 1 diabetic admitted with uncontrolled diabetes secondary to a right gluteal and labial abscess requiring drainage and debridement on 5/24. She was on an insulin IV infusion. Currently afebrile, on Cefepime & Vancomycin  blood sugars 182-351; anion gap 10;  transitioned back to subcutaneous insulin; will continue to monitor closely.

## 2013-09-22 NOTE — Progress Notes (Signed)
Physical Therapy Wound Treatment Patient Details  Name: Ashley Freeman MRN: [id number] Date of Birth: 1991-02-18  Today's Date: 09/22/2013 Time: 1400-1502 Time Calculation (min): 62 min  Subjective  Subjective: I'm afraid this is going to hurt (tearful). Later, that actually felt OK Patient and Family Stated Goals: to get wounds better Date of Onset: 08/18/13 Prior Treatments: I&D x 2; wet to dry dressing  Pain Score: Pain Score: 2/10 at end of session  Wound Assessment  Wound / Incision (Open or Dehisced) 09/19/13 Perineum labia (Active)  Dressing Type ABD;Gauze (Comment);Mesh briefs 09/22/2013  3:11 PM  Dressing Changed Changed 09/22/2013  3:11 PM  Dressing Status Clean;Dry;Intact 09/22/2013  3:11 PM  Dressing Change Frequency Twice a day 09/22/2013  3:11 PM  Site / Wound Assessment Dusky;Granulation tissue;Yellow 09/22/2013  3:11 PM  % Wound base Red or Granulating 75% 09/22/2013  3:11 PM  % Wound base Yellow 25% 09/22/2013  3:11 PM  % Wound base Black 0% 09/22/2013  3:11 PM  % Wound base Other (Comment) 0% 09/22/2013  3:11 PM  Peri-wound Assessment Maceration;Induration;Edema 09/22/2013  3:11 PM  Wound Length (cm) 1.5 cm 09/22/2013  3:11 PM  Wound Width (cm) 0.1 cm 09/22/2013  3:11 PM  Wound Depth (cm) 0.9 cm 09/22/2013  3:11 PM  Margins Unattached edges (unapproximated) 09/22/2013  3:11 PM  Closure None 09/22/2013  3:11 PM  Drainage Amount Moderate 09/22/2013  3:11 PM  Drainage Description Purulent;Green 09/22/2013  3:11 PM  Non-staged Wound Description Full thickness 09/22/2013  3:11 PM  Treatment Hydrotherapy (Pulse lavage);Packing (Impregnated strip) 09/22/2013  3:11 PM     Wound / Incision (Open or Dehisced) 09/19/13 Buttocks Right (Active)  Dressing Type ABD;Gauze (Comment);Moist to moist;Mesh briefs 09/22/2013  3:11 PM  Dressing Changed Changed 09/22/2013  3:11 PM  Dressing Status Clean;Dry;Intact 09/22/2013  3:11 PM  Dressing Change Frequency Twice a day 09/22/2013  3:11 PM  Site /  Wound Assessment Red;Bleeding 09/22/2013  3:11 PM  % Wound base Red or Granulating 100% 09/22/2013  3:11 PM  % Wound base Yellow 0% 09/22/2013  3:11 PM  % Wound base Black 0% 09/22/2013  3:11 PM  Peri-wound Assessment Edema;Induration 09/22/2013  3:11 PM  Wound Length (cm) 2 cm 09/22/2013  3:11 PM  Wound Width (cm) 2.4 cm 09/22/2013  3:11 PM  Wound Depth (cm) 5.5 cm 09/22/2013  3:11 PM  Tunneling (cm) tunnels to connect to opening in Rt perineum (medial to labia) 09/22/2013  3:11 PM  Margins Unattached edges (unapproximated) 09/22/2013  3:11 PM  Closure None 09/22/2013  3:11 PM  Drainage Amount Moderate 09/22/2013  3:11 PM  Drainage Description Sanguineous 09/22/2013  3:11 PM  Non-staged Wound Description Full thickness 09/22/2013  3:11 PM  Treatment Hydrotherapy (Pulse lavage);Packing (Impregnated strip) 09/22/2013  3:11 PM      Hydrotherapy Pulsed lavage therapy - wound location: Rt buttock (connects to perineum); Rt major labia (lateral) Pulsed Lavage with Suction (psi): 4 psi Pulsed Lavage with Suction - Normal Saline Used: 1000 mL Pulsed Lavage Tip: Tip with splash shield   Wound Assessment and Plan  Wound Therapy - Assess/Plan/Recommendations Wound Therapy - Clinical Statement: Pt s/p I&D x 2 with purulent drainage continuing from Rt major labia wound. Difficult to visualize if necrotic tissue present in buttock to perineum wound due to bloody drainage. Agree will benefit from hydrotherapy to decr bacterial burden, remove necrotic tissue, and promote wound healing. Wound Therapy - Functional Problem List: limited mobility due to pain Factors Delaying/Impairing Wound Healing:  Diabetes Mellitus;Infection - systemic/local;Immobility Hydrotherapy Plan: Debridement;Dressing change;Patient/family education;Pulsatile lavage with suction Wound Therapy - Frequency: 6X / week Wound Therapy - Current Recommendations: Diabetic teaching Wound Therapy - Follow Up Recommendations: Home health RN Wound Plan:  see above  Wound Therapy Goals- Improve the function of patient's integumentary system by progressing the wound(s) through the phases of wound healing (inflammation - proliferation - remodeling) by: Decrease Necrotic Tissue to: <10% Decrease Necrotic Tissue - Progress: Goal set today Increase Granulation Tissue to: >90% Increase Granulation Tissue - Progress: Goal set today Improve Drainage Characteristics: Min;Serous Improve Drainage Characteristics - Progress: Goal set today Goals/treatment plan/discharge plan were made with and agreed upon by patient/family: Yes Time For Goal Achievement: 7 days Wound Therapy - Potential for Goals: Good  Goals will be updated until maximal potential achieved or discharge criteria met.  Discharge criteria: when goals achieved, discharge from hospital, MD decision/surgical intervention, no progress towards goals, refusal/missing three consecutive treatments without notification or medical reason.  GP     [name] Jozef Eisenbeis 09/22/2013, 3:27 PM Pager [phone]

## 2013-09-23 ENCOUNTER — Inpatient Hospital Stay (HOSPITAL_COMMUNITY): Payer: Self-pay

## 2013-09-23 LAB — BASIC METABOLIC PANEL WITH GFR
BUN: 4 mg/dL — ABNORMAL LOW (ref 6–23)
CO2: 24 meq/L (ref 19–32)
Calcium: 9.2 mg/dL (ref 8.4–10.5)
Chloride: 100 meq/L (ref 96–112)
Creatinine, Ser: 0.47 mg/dL — ABNORMAL LOW (ref 0.50–1.10)
GFR calc Af Amer: >90 mL/min
GFR calc non Af Amer: >90 mL/min
Glucose, Bld: 346 mg/dL — ABNORMAL HIGH (ref 70–99)
Potassium: 3.7 meq/L (ref 3.7–5.3)
Sodium: 137 meq/L (ref 137–147)

## 2013-09-23 LAB — CBC WITH DIFFERENTIAL/PLATELET
Basophils Absolute: 0.2 K/uL — ABNORMAL HIGH (ref 0.0–0.1)
Basophils Relative: 1 % (ref 0–1)
Eosinophils Absolute: 0.3 K/uL (ref 0.0–0.7)
Eosinophils Relative: 2 % (ref 0–5)
HCT: 31.6 % — ABNORMAL LOW (ref 36.0–46.0)
Hemoglobin: 10.3 g/dL — ABNORMAL LOW (ref 12.0–15.0)
Lymphocytes Relative: 20 % (ref 12–46)
Lymphs Abs: 3 K/uL (ref 0.7–4.0)
MCH: 26.5 pg (ref 26.0–34.0)
MCHC: 32.6 g/dL (ref 30.0–36.0)
MCV: 81.2 fL (ref 78.0–100.0)
Monocytes Absolute: 1.1 K/uL — ABNORMAL HIGH (ref 0.1–1.0)
Monocytes Relative: 7 % (ref 3–12)
Neutro Abs: 10.5 K/uL — ABNORMAL HIGH (ref 1.7–7.7)
Neutrophils Relative %: 70 % (ref 43–77)
Platelets: 512 K/uL — ABNORMAL HIGH (ref 150–400)
RBC: 3.89 MIL/uL (ref 3.87–5.11)
RDW: 14.1 % (ref 11.5–15.5)
Smear Review: INCREASED
WBC: 15.1 K/uL — ABNORMAL HIGH (ref 4.0–10.5)

## 2013-09-23 LAB — CULTURE, ROUTINE-ABSCESS: Gram Stain: NONE SEEN

## 2013-09-23 LAB — GLUCOSE, CAPILLARY
Glucose-Capillary: 407 mg/dL — ABNORMAL HIGH (ref 70–99)
Glucose-Capillary: 227 mg/dL — ABNORMAL HIGH (ref 70–99)
Glucose-Capillary: 178 mg/dL — ABNORMAL HIGH (ref 70–99)
Glucose-Capillary: 193 mg/dL — ABNORMAL HIGH (ref 70–99)

## 2013-09-23 LAB — TROPONIN I

## 2013-09-23 MED ORDER — DOCUSATE SODIUM 100 MG PO CAPS
100.0000 mg | ORAL_CAPSULE | Freq: Every day | ORAL | Status: DC
  Administered 2013-09-23 – 2013-09-24 (×2): 100 mg via ORAL
  Filled 2013-09-23 (×3): qty 1

## 2013-09-23 MED ORDER — POLYETHYLENE GLYCOL 3350 17 G PO PACK
17.0000 g | PACK | Freq: Every day | ORAL | Status: DC | PRN
  Filled 2013-09-23: qty 1

## 2013-09-23 MED ORDER — ENOXAPARIN SODIUM 40 MG/0.4ML ~~LOC~~ SOLN
40.0000 mg | SUBCUTANEOUS | Status: DC
  Administered 2013-09-23 – 2013-09-26 (×4): 40 mg via SUBCUTANEOUS
  Filled 2013-09-23 (×7): qty 0.4

## 2013-09-23 NOTE — Progress Notes (Signed)
Inpatient Diabetes Program Recommendations  AACE/ADA: New Consensus Statement on Inpatient Glycemic Control (2013)  Target Ranges:  Prepandial:   less than 140 mg/dL      Peak postprandial:   less than 180 mg/dL (1-2 hours)      Critically ill patients:  140 - 180 mg/dL   Reason for Visit: Hyperglycemia  Results for Ashley Freeman, Ashley Freeman (MRN [id number]) as of 09/23/2013 11:00  Ref. Range 09/22/2013 08:17 09/22/2013 11:38 09/22/2013 16:20 09/22/2013 22:14 09/23/2013 07:42  Glucose-Capillary Latest Range: 70-99 mg/dL 253 (H) 201 (H) 127 (H) 208 (H) 407 (H)   Continues with high blood sugars. To OR tomorrow am and will be NPO after MN.  Would benefit from addition of meal coverage insulin. Continue to titrate Lantus until FBS < 180 mg/dL.  Please consider addition of Novolog 4 units tidwc for meal coverage insulin.  When NPO, change Novolog to Q4H. Continue titration of Lantus.  Will continue to follow. Thank you. Lorenda Peck, RD, LDN, CDE Inpatient Diabetes Coordinator [phone]

## 2013-09-23 NOTE — Progress Notes (Signed)
Patient c/o chest pain,rated 8/10,States " hurts when I breathe",VS stable.Dr. Ronnald Ramp notified.Order received.Will continue to monitor. Jeovani Weisenburger Lurline Hare, RN

## 2013-09-23 NOTE — Progress Notes (Signed)
Patient ID: Ashley Freeman, female   DOB: 1990/10/19, 23 y.o.   MRN: [id number]  Subjective: Febrile last night.  WBC pending today.    Objective:  Vital signs:  Filed Vitals:   09/22/13 1610 09/22/13 1907 09/22/13 2157 09/23/13 0506  BP: 124/83 125/78 141/95 131/87  Pulse: 108 110 102 110  Temp: 98.3 F (36.8 C) 98.4 F (36.9 C) 100.5 F (38.1 C) 98.4 F (36.9 C)  TempSrc: Oral Oral Oral Oral  Resp:  '25 20 18  ' Height:      Weight:   123 lb 3.8 oz (55.9 kg)   SpO2: 94% 94% 94% 92%    Last BM Date: 09/20/13  Intake/Output   Yesterday:  05/26 0701 - 05/27 0700 In: 3925.4 [P.O.:540; I.V.:2985.4; IV Piggyback:400] Out: 5250 [Urine:5250] This shift:    I/O last 3 completed shifts: In: 5880.4 [P.O.:1020; I.V.:4110.4; IV Piggyback:750] Out: 2706 [Urine:7950]   Physical Exam: General: Pt awake/alert/oriented x4 in no acute distress Skin: right gluteal abscess with induration, moderate amount of pus, not adequately draining    Problem List:   Principal Problem:   Bartholin's gland abscess Active Problems:   Diabetes mellitus type 1 (uncontrolled)   Hyperglycemia   H/O medication noncompliance   Abscess, gluteal, right   DKA (diabetic ketoacidoses)   Infected cyst of Bartholin's gland duct   Sepsis    Results:   Labs: Results for orders placed during the hospital encounter of 09/19/13 (from the past 48 hour(s))  GLUCOSE, CAPILLARY     Status: Abnormal   Collection Time    09/21/13 12:49 PM      Result Value Ref Range   Glucose-Capillary 182 (*) 70 - 99 mg/dL   Comment 1 Notify RN     Comment 2 Documented in Chart    VANCOMYCIN, TROUGH     Status: Abnormal   Collection Time    09/21/13  2:00 PM      Result Value Ref Range   Vancomycin Tr <5.0 (*) 10.0 - 20.0 ug/mL  GLUCOSE, CAPILLARY     Status: Abnormal   Collection Time    09/21/13  4:31 PM      Result Value Ref Range   Glucose-Capillary 206 (*) 70 - 99 mg/dL   Comment 1 Notify RN     Comment  2 Documented in Chart    GLUCOSE, CAPILLARY     Status: Abnormal   Collection Time    09/21/13  9:26 PM      Result Value Ref Range   Glucose-Capillary 270 (*) 70 - 99 mg/dL   Comment 1 Documented in Chart     Comment 2 Notify RN    CBC WITH DIFFERENTIAL     Status: Abnormal   Collection Time    09/22/13  3:33 AM      Result Value Ref Range   WBC 22.7 (*) 4.0 - 10.5 K/uL   RBC 3.37 (*) 3.87 - 5.11 MIL/uL   Hemoglobin 8.9 (*) 12.0 - 15.0 g/dL   HCT 27.2 (*) 36.0 - 46.0 %   MCV 80.7  78.0 - 100.0 fL   MCH 26.4  26.0 - 34.0 pg   MCHC 32.7  30.0 - 36.0 g/dL   RDW 13.8  11.5 - 15.5 %   Platelets 415 (*) 150 - 400 K/uL   Neutrophils Relative % 75  43 - 77 %   Lymphocytes Relative 16  12 - 46 %   Monocytes Relative 7  3 - 12 %  Eosinophils Relative 2  0 - 5 %   Basophils Relative 0  0 - 1 %   Neutro Abs 17.0 (*) 1.7 - 7.7 K/uL   Lymphs Abs 3.6  0.7 - 4.0 K/uL   Monocytes Absolute 1.6 (*) 0.1 - 1.0 K/uL   Eosinophils Absolute 0.5  0.0 - 0.7 K/uL   Basophils Absolute 0.0  0.0 - 0.1 K/uL  BASIC METABOLIC PANEL     Status: Abnormal   Collection Time    09/22/13  3:33 AM      Result Value Ref Range   Sodium 136 (*) 137 - 147 mEq/L   Potassium 4.0  3.7 - 5.3 mEq/L   Chloride 102  96 - 112 mEq/L   CO2 24  19 - 32 mEq/L   Glucose, Bld 287 (*) 70 - 99 mg/dL   BUN 3 (*) 6 - 23 mg/dL   Creatinine, Ser 0.46 (*) 0.50 - 1.10 mg/dL   Calcium 8.6  8.4 - 10.5 mg/dL   GFR calc non Af Amer >90  >90 mL/min   GFR calc Af Amer >90  >90 mL/min   Comment: (NOTE)     The eGFR has been calculated using the CKD EPI equation.     This calculation has not been validated in all clinical situations.     eGFR's persistently <90 mL/min signify possible Chronic Kidney     Disease.  GLUCOSE, CAPILLARY     Status: Abnormal   Collection Time    09/22/13  8:17 AM      Result Value Ref Range   Glucose-Capillary 253 (*) 70 - 99 mg/dL   Comment 1 Documented in Chart     Comment 2 Notify RN    GLUCOSE,  CAPILLARY     Status: Abnormal   Collection Time    09/22/13 11:38 AM      Result Value Ref Range   Glucose-Capillary 201 (*) 70 - 99 mg/dL  VANCOMYCIN, TROUGH     Status: Abnormal   Collection Time    09/22/13  1:30 PM      Result Value Ref Range   Vancomycin Tr 7.7 (*) 10.0 - 20.0 ug/mL  GLUCOSE, CAPILLARY     Status: Abnormal   Collection Time    09/22/13  4:20 PM      Result Value Ref Range   Glucose-Capillary 127 (*) 70 - 99 mg/dL  GLUCOSE, CAPILLARY     Status: Abnormal   Collection Time    09/22/13 10:14 PM      Result Value Ref Range   Glucose-Capillary 208 (*) 70 - 99 mg/dL  GLUCOSE, CAPILLARY     Status: Abnormal   Collection Time    09/23/13  7:42 AM      Result Value Ref Range   Glucose-Capillary 407 (*) 70 - 99 mg/dL  CBC WITH DIFFERENTIAL     Status: Abnormal (Preliminary result)   Collection Time    09/23/13  9:05 AM      Result Value Ref Range   WBC PENDING  4.0 - 10.5 K/uL   RBC 3.89  3.87 - 5.11 MIL/uL   Hemoglobin 10.3 (*) 12.0 - 15.0 g/dL   Comment: REPEATED TO VERIFY   HCT 31.6 (*) 36.0 - 46.0 %   MCV 81.2  78.0 - 100.0 fL   MCH 26.5  26.0 - 34.0 pg   MCHC 32.6  30.0 - 36.0 g/dL   RDW 14.1  11.5 - 15.5 %   Platelets  512 (*) 150 - 400 K/uL   Comment: REPEATED TO VERIFY   Neutrophils Relative % PENDING  43 - 77 %   Neutro Abs PENDING  1.7 - 7.7 K/uL   Band Neutrophils PENDING  0 - 10 %   Lymphocytes Relative PENDING  12 - 46 %   Lymphs Abs PENDING  0.7 - 4.0 K/uL   Monocytes Relative PENDING  3 - 12 %   Monocytes Absolute PENDING  0.1 - 1.0 K/uL   Eosinophils Relative PENDING  0 - 5 %   Eosinophils Absolute PENDING  0.0 - 0.7 K/uL   Basophils Relative PENDING  0 - 1 %   Basophils Absolute PENDING  0.0 - 0.1 K/uL   WBC Morphology PENDING     RBC Morphology PENDING     Smear Review PENDING     nRBC PENDING  0 /100 WBC   Metamyelocytes Relative PENDING     Myelocytes PENDING     Promyelocytes Absolute PENDING     Blasts PENDING    BASIC  METABOLIC PANEL     Status: Abnormal   Collection Time    09/23/13  9:05 AM      Result Value Ref Range   Sodium 137  137 - 147 mEq/L   Potassium 3.7  3.7 - 5.3 mEq/L   Chloride 100  96 - 112 mEq/L   CO2 24  19 - 32 mEq/L   Glucose, Bld 346 (*) 70 - 99 mg/dL   BUN 4 (*) 6 - 23 mg/dL   Creatinine, Ser 0.47 (*) 0.50 - 1.10 mg/dL   Calcium 9.2  8.4 - 10.5 mg/dL   GFR calc non Af Amer >90  >90 mL/min   GFR calc Af Amer >90  >90 mL/min   Comment: (NOTE)     The eGFR has been calculated using the CKD EPI equation.     This calculation has not been validated in all clinical situations.     eGFR's persistently <90 mL/min signify possible Chronic Kidney     Disease.    Imaging / Studies: No results found.  Scheduled Meds: . ceFEPime (MAXIPIME) IV  1 g Intravenous Q12H  . docusate sodium  100 mg Oral Daily  . enoxaparin (LOVENOX) injection  40 mg Subcutaneous Q24H  . insulin aspart  0-15 Units Subcutaneous TID WC  . insulin aspart  0-5 Units Subcutaneous QHS  . insulin glargine  25 Units Subcutaneous Daily  . vancomycin  1,000 mg Intravenous Q6H   Continuous Infusions: . sodium chloride 1,000 mL (09/23/13 0143)   PRN Meds:.acetaminophen, dextrose, diphenhydrAMINE, morphine injection, ondansetron (ZOFRAN) IV, oxyCODONE-acetaminophen, polyethylene glycol   Antibiotics: Anti-infectives   Start     Dose/Rate Route Frequency Ordered Stop   09/22/13 2100  vancomycin (VANCOCIN) IVPB 1000 mg/200 mL premix     1,000 mg 200 mL/hr over 60 Minutes Intravenous Every 6 hours 09/22/13 1616     09/21/13 2200  vancomycin (VANCOCIN) IVPB 750 mg/150 ml premix  Status:  Discontinued     750 mg 150 mL/hr over 60 Minutes Intravenous Every 8 hours 09/21/13 1553 09/22/13 1616   09/21/13 1600  vancomycin (VANCOCIN) IVPB 750 mg/150 ml premix  Status:  Discontinued     750 mg 150 mL/hr over 60 Minutes Intravenous Every 8 hours 09/21/13 1548 09/21/13 1553   09/21/13 1600  vancomycin (VANCOCIN) IVPB 1000  mg/200 mL premix     1,000 mg 200 mL/hr over 60 Minutes Intravenous  Once 09/21/13 1553  09/21/13 1700   09/19/13 2230  ceFEPIme (MAXIPIME) 1 g in dextrose 5 % 50 mL IVPB     1 g 100 mL/hr over 30 Minutes Intravenous Every 12 hours 09/19/13 2225     09/19/13 2230  vancomycin (VANCOCIN) 500 mg in sodium chloride 0.9 % 100 mL IVPB  Status:  Discontinued     500 mg 100 mL/hr over 60 Minutes Intravenous Every 8 hours 09/19/13 2226 09/21/13 1548   09/19/13 1800  clindamycin (CLEOCIN) IVPB 600 mg     600 mg 100 mL/hr over 30 Minutes Intravenous  Once 09/19/13 1755 09/19/13 2054   09/19/13 0000  clindamycin (CLEOCIN) 150 MG capsule     150 mg Oral Every 6 hours 09/19/13 1558        Assessment/Plan Type I diabetes mellitus, uncontrolled Right gluteal abscess and labial abscesses  -c/w hydrotherapy for the labial abscesses -OR in AM for debridement of gluteal abscess -NPO after midnight -follow cultures -CBGs remain high, needs tighter control  Erby Pian, [employer] [facility] Pager [phone] Office [phone]  09/23/2013 10:35 AM

## 2013-09-23 NOTE — Progress Notes (Addendum)
Subjective: No acute events overnight.  Denies fever/chills.  Pain well controlled.  Appetite is good.  Feels like she has to urinate a lot despite foley in place.  Objective: Vital signs in last 24 hours: Filed Vitals:   09/22/13 1610 09/22/13 1907 09/22/13 2157 09/23/13 0506  BP: 124/83 125/78 141/95 131/87  Pulse: 108 110 102 110  Temp: 98.3 F (36.8 C) 98.4 F (36.9 C) 100.5 F (38.1 C) 98.4 F (36.9 C)  TempSrc: Oral Oral Oral Oral  Resp:  25 20 18   Height:      Weight:   55.9 kg (123 lb 3.8 oz)   SpO2: 94% 94% 94% 92%   Weight change: 0.5 kg (1 lb 1.6 oz)  Intake/Output Summary (Last 24 hours) at 09/23/13 0805 Last data filed at 09/23/13 0653  Gross per 24 hour  Intake 3800.42 ml  Output   4500 ml  Net -699.58 ml   General: resting in bed in NAD HEENT: no gross abnormality Cardiac: tachycardic, regular rhythm, no rubs, murmurs or gallops Pulm:  CTA B/L, moving normal volumes of air Abd: soft, nontender, mild distention, BS present GU:  Foley in place draining clear urine LE:  Trace edema feet Neuro: alert and oriented X3, responding appropriately  Lab Results: Basic Metabolic Panel:  Recent Labs Lab 09/19/13 2247 09/20/13 0320  09/21/13 0430 09/22/13 0333  NA 132* 135*  136*  < > 133* 136*  K 3.9 4.0  4.0  < > 4.3 4.0  CL 97 103  102  < > 100 102  CO2 21 20  21   < > 20 24  GLUCOSE 324* 163*  163*  < > 378* 287*  BUN 6 4*  4*  < > 4* 3*  CREATININE 0.57 0.50  0.50  < > 0.57 0.46*  CALCIUM 8.5 8.1*  8.0*  < > 8.6 8.6  MG 1.7  --   --   --   --   PHOS 2.2* 2.3  --   --   --   < > = values in this interval not displayed. Liver Function Tests:  Recent Labs Lab 09/19/13 1830  AST 10  ALT <5  ALKPHOS 138*  BILITOT 0.2*  PROT 6.6  ALBUMIN 2.7*   CBC:  Recent Labs Lab 09/21/13 0430 09/22/13 0333  WBC 22.8* 22.7*  NEUTROABS 17.4* 17.0*  HGB 10.1* 8.9*  HCT 30.2* 27.2*  MCV 81.4 80.7  PLT 506* 415*   CBG:  Recent Labs Lab  09/21/13 1631 09/21/13 2126 09/22/13 0817 09/22/13 1138 09/22/13 1620 09/22/13 2214  GLUCAP 206* 270* 253* 201* 127* 208*    Urinalysis:  Recent Labs Lab 09/19/13 1846 09/20/13 1130  COLORURINE STRAW* YELLOW  LABSPEC 1.036* 1.020  PHURINE 6.0 6.0  GLUCOSEU >1000* >1000*  HGBUR LARGE* NEGATIVE  BILIRUBINUR NEGATIVE NEGATIVE  KETONESUR 15* NEGATIVE  PROTEINUR NEGATIVE NEGATIVE  UROBILINOGEN 0.2 0.2  NITRITE NEGATIVE NEGATIVE  LEUKOCYTESUR MODERATE* NEGATIVE    Micro Results: Recent Results (from the past 240 hour(s))  WET PREP, GENITAL     Status: Abnormal   Collection Time    09/15/13  3:39 PM      Result Value Ref Range Status   Yeast Wet Prep HPF POC NONE SEEN  NONE SEEN Final   Trich, Wet Prep MODERATE (*) NONE SEEN Final   Clue Cells Wet Prep HPF POC FEW (*) NONE SEEN Final   WBC, Wet Prep HPF POC MODERATE (*) NONE SEEN Final  GC/CHLAMYDIA  PROBE AMP     Status: None   Collection Time    09/15/13  3:39 PM      Result Value Ref Range Status   CT Probe RNA NEGATIVE  NEGATIVE Final   GC Probe RNA NEGATIVE  NEGATIVE Final   Comment: (NOTE)                                                                                               **Normal Reference Range: Negative**          Assay performed using the Gen-Probe APTIMA COMBO2 (R) Assay.     Acceptable specimen types for this assay include APTIMA Swabs (Unisex,     endocervical, urethral, or vaginal), first void urine, and ThinPrep     liquid based cytology samples.     Performed at [place]     Status: None   Collection Time    09/19/13  6:46 PM      Result Value Ref Range Status   Specimen Description URINE, CLEAN CATCH   Final   Special Requests NONE   Final   Culture  Setup Time     Final   Value: 09/20/2013 03:48     Performed at [place]     Final   Value: >=100,000 COLONIES/ML     Performed at [facility]   Culture     Final   Value:  LACTOBACILLUS SPECIES     Note: Standardized susceptibility testing for this organism is not available.     Performed at [facility]   Report Status 09/21/2013 FINAL   Final  WOUND CULTURE     Status: None   Collection Time    09/19/13  8:50 PM      Result Value Ref Range Status   Specimen Description PERIRECTAL   Final   Special Requests NONE   Final   Gram Stain     Final   Value: RARE WBC PRESENT,BOTH PMN AND MONONUCLEAR     NO SQUAMOUS EPITHELIAL CELLS SEEN     RARE GRAM POSITIVE COCCI     IN CHAINS RARE GRAM NEGATIVE RODS     Performed at [facility]   Culture     Final   Value: Multiple Species Consistent With Normal Fecal Flora     Performed at [facility]   Report Status 09/22/2013 FINAL   Final  MRSA PCR SCREENING     Status: None   Collection Time    09/19/13 10:01 PM      Result Value Ref Range Status   MRSA by PCR NEGATIVE  NEGATIVE Final   Comment:            The GeneXpert MRSA Assay (FDA     approved for NASAL specimens     only), is one component of a     comprehensive MRSA colonization     surveillance program. It is not     intended to diagnose MRSA     infection nor to guide or  monitor treatment for     MRSA infections.  CULTURE, BLOOD (ROUTINE X 2)     Status: None   Collection Time    09/19/13 10:47 PM      Result Value Ref Range Status   Specimen Description BLOOD RIGHT ANTECUBITAL   Final   Special Requests BOTTLES DRAWN AEROBIC ONLY 5CC   Final   Culture  Setup Time     Final   Value: 09/20/2013 03:14     Performed at [facility]   Culture     Final   Value:        BLOOD CULTURE RECEIVED NO GROWTH TO DATE CULTURE WILL BE HELD FOR 5 DAYS BEFORE ISSUING A FINAL NEGATIVE REPORT     Performed at [facility]   Report Status PENDING   Incomplete  CULTURE, BLOOD (ROUTINE X 2)     Status: None   Collection Time    09/19/13 10:57 PM      Result Value Ref Range Status   Specimen Description BLOOD RIGHT  HAND   Final   Special Requests BOTTLES DRAWN AEROBIC ONLY 5CC   Final   Culture  Setup Time     Final   Value: 09/20/2013 03:14     Performed at [facility]   Culture     Final   Value:        BLOOD CULTURE RECEIVED NO GROWTH TO DATE CULTURE WILL BE HELD FOR 5 DAYS BEFORE ISSUING A FINAL NEGATIVE REPORT     Performed at [facility]   Report Status PENDING   Incomplete  ANAEROBIC CULTURE     Status: None   Collection Time    09/20/13  1:22 AM      Result Value Ref Range Status   Specimen Description ABSCESS RIGHT LABIA   Final   Special Requests PATIENT ON FOLLOWING VANCOMYCIN,CEFAPIME   Final   Gram Stain     Final   Value: NO WBC SEEN     NO SQUAMOUS EPITHELIAL CELLS SEEN     RARE GRAM POSITIVE COCCI     IN PAIRS     Performed at [facility]   Culture     Final   Value: NO ANAEROBES ISOLATED; CULTURE IN PROGRESS FOR 5 DAYS     Performed at [facility]   Report Status PENDING   Incomplete  CULTURE, ROUTINE-ABSCESS     Status: None   Collection Time    09/20/13  1:22 AM      Result Value Ref Range Status   Specimen Description ABSCESS RIGHT LABIA   Final   Special Requests PATIENT ON FOLLOWING VANCOMYCIN,CEFAPIME   Final   Gram Stain     Final   Value: NO WBC SEEN     NO SQUAMOUS EPITHELIAL CELLS SEEN     RARE GRAM POSITIVE COCCI     IN PAIRS     Performed at [facility]   Culture     Final   Value: RARE CANDIDA ALBICANS     Performed at [facility]   Report Status 09/23/2013 FINAL   Final   Medications: I have reviewed the patient's current medications. Scheduled Meds: . ceFEPime (MAXIPIME) IV  1 g Intravenous Q12H  . insulin aspart  0-15 Units Subcutaneous TID WC  . insulin aspart  0-5 Units Subcutaneous QHS  . insulin glargine  25 Units Subcutaneous Daily  . vancomycin  1,000 mg Intravenous Q6H  Continuous Infusions: . sodium chloride 1,000 mL (09/23/13 0143)   PRN Meds:.acetaminophen, dextrose,  diphenhydrAMINE, morphine injection, ondansetron (ZOFRAN) IV, oxyCODONE-acetaminophen  Assessment/Plan: Sepsis 2/2 right labial abscess and persistent gluteal abscess: Patient meets 2/4 SIRS criteria with leucocytosis to 19, tachycardia to 120s; afebrile, no tachypnea. Lactic acid elevated to 2.9, now 1.6.  Surgery consulted and appreciate surgical intervention.  Patient is s/p I&D of both abscesses in OR.  Pain is well controlled.  She is tachycardic at baseline (looking back at old records). Urine culture + lactobacillus (likely contaminant).  Wound cultures - rare candida (labia abscess), normal flora (perirectal).  WBC stable.  - continue vancomycin, cefepime per pharmacy (patient has penicillin allergy); f/u surgery recomendations - morphine IV 2 mg q4h prn  - BID wet-to-dry gauze changes to each wound  - continue foley to keep wounds clean - blood culture pending - NGTD - monitor CBC and for s&s of infection  DKA in setting of uncontrolled DM1, resolved: A1C 19.3%. On admission, glucose 705-->163, AG 19-->12, bicarb 19, + ketonuria. Lactic acid 2.9-->1.6. Leucocytosis to 19, DKA very likely precipitated by infection per above. Patient is s/p NS 3L in ED.  Off insulin gtt and transitioned to [facility]. - continue Lantus 25 units daily and SSI moderate - monitor CBG  Diet:  Carb mod VTE ppx: Lovenox Code:  Full  Dispo: Disposition is deferred at this time, awaiting improvement of current medical problems.  Anticipated discharge in approximately 2-3 day(s).   The patient does have a current PCP Cresenciano Genre, MD) and does need an [facility] hospital follow-up appointment after discharge.  The patient does not know have transportation limitations that hinder transportation to clinic appointments.  .Services Needed at time of discharge: Y = Yes, Blank = No PT:   OT:   RN:   Equipment:   Other:     LOS: 4 days   Duwaine Maxin, DO 09/23/2013, 8:05 AM Attending physician note: I personally  examined this patient. Physical findings, problem list, and status, accurate as recorded by resident physician Dr. Duwaine Maxin. Insulin dose is being adjusted. She remains afebrile on antibiotics status post incision, drainage, and debridement of labial and gluteal abscess.  Murriel Hopper, MD, [place]  Hematology-Oncology/Internal Medicine

## 2013-09-23 NOTE — Progress Notes (Signed)
Infection is not well controlled.  Needs further I&D for better drainage.  Spoke with the patient's mother who understand, and she will talk with her daughter.  Will schedule for I&D tomorrow.  Kathryne Eriksson. Dahlia Bailiff, MD, [place] [phone] [facility]

## 2013-09-23 NOTE — Progress Notes (Signed)
Hydrotherapy Cancellation Note  Patient Details Name: Ashley Freeman MRN: [id number] DOB: 11-19-1990   Cancelled Treatment:    Reason Treat Not Completed: Spoke with [name], ANP-BC after she inspected the patient's wounds and she stated not to do hydrotherapy today (as pt will be going back to surgery) and for wound to be re-dressed. RN made aware that dressing needs to be replaced and agreed to do so (as hydrotherapy not to be done today).  Will plan to continue hydrotherapy 5/28 unless otherwise indicated.   [name] 09/23/2013, 10:40 AM Pager [phone]

## 2013-09-24 ENCOUNTER — Encounter (HOSPITAL_COMMUNITY): Payer: Self-pay | Admitting: Certified Registered"

## 2013-09-24 ENCOUNTER — Inpatient Hospital Stay (HOSPITAL_COMMUNITY): Payer: Self-pay

## 2013-09-24 ENCOUNTER — Encounter (HOSPITAL_COMMUNITY): Payer: Self-pay | Admitting: Certified Registered Nurse Anesthetist

## 2013-09-24 ENCOUNTER — Inpatient Hospital Stay (HOSPITAL_COMMUNITY): Payer: Medicaid Other | Admitting: Certified Registered"

## 2013-09-24 ENCOUNTER — Encounter (HOSPITAL_COMMUNITY)
Admission: EM | Disposition: A | Discharge status: Home / Self Care | Payer: Self-pay | Source: Home / Self Care | Attending: Oncology

## 2013-09-24 DIAGNOSIS — N926 Irregular menstruation, unspecified: Secondary | ICD-10-CM

## 2013-09-24 DIAGNOSIS — L03317 Cellulitis of buttock: Secondary | ICD-10-CM

## 2013-09-24 DIAGNOSIS — E1065 Type 1 diabetes mellitus with hyperglycemia: Secondary | ICD-10-CM

## 2013-09-24 DIAGNOSIS — IMO0002 Reserved for concepts with insufficient information to code with codable children: Secondary | ICD-10-CM

## 2013-09-24 DIAGNOSIS — K612 Anorectal abscess: Secondary | ICD-10-CM

## 2013-09-24 DIAGNOSIS — L0231 Cutaneous abscess of buttock: Secondary | ICD-10-CM

## 2013-09-24 HISTORY — PX: INCISION AND DRAINAGE PERIRECTAL ABSCESS: SHX1804

## 2013-09-24 LAB — GLUCOSE, CAPILLARY
GLUCOSE-CAPILLARY: 444 mg/dL — AB (ref 70–99)
GLUCOSE-CAPILLARY: 468 mg/dL — AB (ref 70–99)
GLUCOSE-CAPILLARY: 131 mg/dL — AB (ref 70–99)
GLUCOSE-CAPILLARY: 159 mg/dL — AB (ref 70–99)
GLUCOSE-CAPILLARY: 174 mg/dL — AB (ref 70–99)
Glucose-Capillary: 237 mg/dL — ABNORMAL HIGH (ref 70–99)
Glucose-Capillary: 76 mg/dL (ref 70–99)

## 2013-09-24 LAB — CBC WITH DIFFERENTIAL/PLATELET
Basophils Absolute: 0.1 10*3/uL (ref 0.0–0.1)
Basophils Relative: 1 % (ref 0–1)
EOS ABS: 0.4 10*3/uL (ref 0.0–0.7)
EOS PCT: 3 % (ref 0–5)
HCT: 28.5 % — ABNORMAL LOW (ref 36.0–46.0)
Hemoglobin: 9.3 g/dL — ABNORMAL LOW (ref 12.0–15.0)
Lymphocytes Relative: 22 % (ref 12–46)
Lymphs Abs: 2.8 10*3/uL (ref 0.7–4.0)
MCH: 26.4 pg (ref 26.0–34.0)
MCHC: 32.6 g/dL (ref 30.0–36.0)
MCV: 81 fL (ref 78.0–100.0)
MONO ABS: 1.4 10*3/uL — AB (ref 0.1–1.0)
Monocytes Relative: 11 % (ref 3–12)
Neutro Abs: 7.8 10*3/uL — ABNORMAL HIGH (ref 1.7–7.7)
Neutrophils Relative %: 63 % (ref 43–77)
RBC: 3.52 MIL/uL — AB (ref 3.87–5.11)
RDW: 14.7 % (ref 11.5–15.5)
WBC: 12.5 10*3/uL — ABNORMAL HIGH (ref 4.0–10.5)

## 2013-09-24 LAB — BASIC METABOLIC PANEL
BUN: 6 mg/dL (ref 6–23)
CO2: 21 meq/L (ref 19–32)
Calcium: 8.9 mg/dL (ref 8.4–10.5)
Chloride: 99 mEq/L (ref 96–112)
Creatinine, Ser: 0.42 mg/dL — ABNORMAL LOW (ref 0.50–1.10)
GFR calc Af Amer: >90 mL/min (ref >90)
GFR calc non Af Amer: >90 mL/min (ref >90)
GLUCOSE: 478 mg/dL — AB (ref 70–99)
POTASSIUM: 4.8 meq/L (ref 3.7–5.3)
SODIUM: 134 meq/L — AB (ref 137–147)

## 2013-09-24 LAB — VANCOMYCIN, TROUGH: Vancomycin Tr: 21.3 ug/mL — ABNORMAL HIGH (ref 10.0–20.0)

## 2013-09-24 SURGERY — INCISION AND DRAINAGE, ABSCESS, PERIRECTAL
Anesthesia: General | Site: Buttocks | Laterality: Right

## 2013-09-24 MED ORDER — PROPOFOL 10 MG/ML IV BOLUS
INTRAVENOUS | Status: DC | PRN
  Administered 2013-09-24: 30 mg via INTRAVENOUS
  Administered 2013-09-24: 120 mg via INTRAVENOUS

## 2013-09-24 MED ORDER — FENTANYL CITRATE 0.05 MG/ML IJ SOLN
INTRAMUSCULAR | Status: AC
  Filled 2013-09-24: qty 5

## 2013-09-24 MED ORDER — ONDANSETRON HCL 4 MG/2ML IJ SOLN: 4.0000 mg | Freq: Once | INTRAMUSCULAR | Status: DC | PRN

## 2013-09-24 MED ORDER — GLYCOPYRROLATE 0.2 MG/ML IJ SOLN
INTRAMUSCULAR | Status: DC | PRN
  Administered 2013-09-24: 0.4 mg via INTRAVENOUS

## 2013-09-24 MED ORDER — ONDANSETRON HCL 4 MG/2ML IJ SOLN
INTRAMUSCULAR | Status: AC
  Filled 2013-09-24: qty 2

## 2013-09-24 MED ORDER — NEOSTIGMINE METHYLSULFATE 10 MG/10ML IV SOLN
INTRAVENOUS | Status: DC | PRN
  Administered 2013-09-24: 3 mg via INTRAVENOUS

## 2013-09-24 MED ORDER — LIDOCAINE HCL (CARDIAC) 20 MG/ML IV SOLN
INTRAVENOUS | Status: AC
  Filled 2013-09-24: qty 5

## 2013-09-24 MED ORDER — SURGILUBE EX GEL
CUTANEOUS | Status: DC | PRN
  Administered 2013-09-24: 1 via TOPICAL

## 2013-09-24 MED ORDER — GLYCOPYRROLATE 0.2 MG/ML IJ SOLN
INTRAMUSCULAR | Status: AC
  Filled 2013-09-24: qty 2

## 2013-09-24 MED ORDER — OXYCODONE HCL 5 MG/5ML PO SOLN: 5.0000 mg | Freq: Once | ORAL | Status: DC | PRN

## 2013-09-24 MED ORDER — MIDAZOLAM HCL 5 MG/5ML IJ SOLN
INTRAMUSCULAR | Status: DC | PRN
  Administered 2013-09-24: 2 mg via INTRAVENOUS

## 2013-09-24 MED ORDER — HYDROMORPHONE HCL PF 1 MG/ML IJ SOLN
INTRAMUSCULAR | Status: AC
  Filled 2013-09-24: qty 1

## 2013-09-24 MED ORDER — FENTANYL CITRATE 0.05 MG/ML IJ SOLN
INTRAMUSCULAR | Status: DC | PRN
Start: 2013-09-24 — End: 2013-09-24
  Administered 2013-09-24: 50 ug via INTRAVENOUS
  Administered 2013-09-24 (×2): 100 ug via INTRAVENOUS

## 2013-09-24 MED ORDER — HYDROMORPHONE HCL PF 1 MG/ML IJ SOLN
0.2500 mg | INTRAMUSCULAR | Status: DC | PRN
  Administered 2013-09-24: 0.5 mg via INTRAVENOUS
  Administered 2013-09-24 (×2): 0.25 mg via INTRAVENOUS

## 2013-09-24 MED ORDER — 0.9 % SODIUM CHLORIDE (POUR BTL) OPTIME
TOPICAL | Status: DC | PRN
  Administered 2013-09-24: 1000 mL

## 2013-09-24 MED ORDER — ROCURONIUM BROMIDE 100 MG/10ML IV SOLN
INTRAVENOUS | Status: DC | PRN
  Administered 2013-09-24: 25 mg via INTRAVENOUS

## 2013-09-24 MED ORDER — OXYCODONE HCL 5 MG PO TABS: 5.0000 mg | ORAL_TABLET | Freq: Once | ORAL | Status: DC | PRN

## 2013-09-24 MED ORDER — NEOSTIGMINE METHYLSULFATE 10 MG/10ML IV SOLN
INTRAVENOUS | Status: AC
  Filled 2013-09-24: qty 1

## 2013-09-24 MED ORDER — LIDOCAINE HCL (CARDIAC) 20 MG/ML IV SOLN
INTRAVENOUS | Status: DC | PRN
  Administered 2013-09-24: 30 mg via INTRAVENOUS

## 2013-09-24 MED ORDER — VANCOMYCIN HCL IN DEXTROSE 1-5 GM/200ML-% IV SOLN
1000.0000 mg | Freq: Three times a day (TID) | INTRAVENOUS | Status: DC
  Administered 2013-09-24 – 2013-09-25 (×4): 1000 mg via INTRAVENOUS
  Filled 2013-09-24 (×5): qty 200

## 2013-09-24 MED ORDER — ONDANSETRON HCL 4 MG/2ML IJ SOLN
INTRAMUSCULAR | Status: DC | PRN
  Administered 2013-09-24: 4 mg via INTRAVENOUS

## 2013-09-24 MED ORDER — ROCURONIUM BROMIDE 50 MG/5ML IV SOLN
INTRAVENOUS | Status: AC
  Filled 2013-09-24: qty 1

## 2013-09-24 MED ORDER — SUCCINYLCHOLINE CHLORIDE 20 MG/ML IJ SOLN
INTRAMUSCULAR | Status: AC
  Filled 2013-09-24: qty 1

## 2013-09-24 MED ORDER — LACTATED RINGERS IV SOLN
INTRAVENOUS | Status: DC
  Administered 2013-09-24: 13:00:00 via INTRAVENOUS

## 2013-09-24 MED ORDER — PROPOFOL 10 MG/ML IV BOLUS
INTRAVENOUS | Status: AC
  Filled 2013-09-24: qty 20

## 2013-09-24 MED ORDER — MIDAZOLAM HCL 2 MG/2ML IJ SOLN
INTRAMUSCULAR | Status: AC
  Filled 2013-09-24: qty 2

## 2013-09-24 SURGICAL SUPPLY — 44 items
BLADE SURG 10 STRL SS (BLADE) ×2 IMPLANT
BLADE SURG 15 STRL LF DISP TIS (BLADE) ×1 IMPLANT
BLADE SURG 15 STRL SS (BLADE) ×1
CANISTER SUCTION 2500CC (MISCELLANEOUS) ×2 IMPLANT
CLEANER TIP ELECTROSURG 2X2 (MISCELLANEOUS) ×2 IMPLANT
COVER SURGICAL LIGHT HANDLE (MISCELLANEOUS) ×2 IMPLANT
DRAPE PED LAPAROTOMY (DRAPES) ×2 IMPLANT
DRSG PAD ABDOMINAL 8X10 ST (GAUZE/BANDAGES/DRESSINGS) ×2 IMPLANT
ELECT REM PT RETURN 9FT ADLT (ELECTROSURGICAL) ×2
ELECTRODE REM PT RTRN 9FT ADLT (ELECTROSURGICAL) ×1 IMPLANT
GAUZE PACKING IODOFORM 1 (PACKING) IMPLANT
GAUZE PACKING IODOFORM 1/2 (PACKING) ×4 IMPLANT
GAUZE SPONGE 4X4 16PLY XRAY LF (GAUZE/BANDAGES/DRESSINGS) ×4 IMPLANT
GLOVE BIO SURGEON STRL SZ7.5 (GLOVE) ×2 IMPLANT
GLOVE BIOGEL PI IND STRL 6.5 (GLOVE) ×1 IMPLANT
GLOVE BIOGEL PI IND STRL 7.5 (GLOVE) ×1 IMPLANT
GLOVE BIOGEL PI IND STRL 8 (GLOVE) ×1 IMPLANT
GLOVE BIOGEL PI INDICATOR 6.5 (GLOVE) ×1
GLOVE BIOGEL PI INDICATOR 7.5 (GLOVE) ×1
GLOVE BIOGEL PI INDICATOR 8 (GLOVE) ×1
GLOVE ECLIPSE 7.5 STRL STRAW (GLOVE) ×2 IMPLANT
GLOVE SURG SS PI 6.5 STRL IVOR (GLOVE) ×2 IMPLANT
GOWN STRL REUS W/ TWL LRG LVL3 (GOWN DISPOSABLE) ×2 IMPLANT
GOWN STRL REUS W/TWL LRG LVL3 (GOWN DISPOSABLE) ×2
KIT BASIN OR (CUSTOM PROCEDURE TRAY) ×2 IMPLANT
KIT ROOM TURNOVER OR (KITS) ×2 IMPLANT
NS IRRIG 1000ML POUR BTL (IV SOLUTION) ×2 IMPLANT
PACK GENERAL/GYN (CUSTOM PROCEDURE TRAY) ×2 IMPLANT
PACK LITHOTOMY IV (CUSTOM PROCEDURE TRAY) IMPLANT
PAD ARMBOARD 7.5X6 YLW CONV (MISCELLANEOUS) ×2 IMPLANT
PENCIL BUTTON HOLSTER BLD 10FT (ELECTRODE) ×2 IMPLANT
SPONGE GAUZE 4X4 12PLY (GAUZE/BANDAGES/DRESSINGS) ×2 IMPLANT
SPONGE GAUZE 4X4 12PLY STER LF (GAUZE/BANDAGES/DRESSINGS) ×2 IMPLANT
SPONGE LAP 4X18 X RAY DECT (DISPOSABLE) ×2 IMPLANT
SURGILUBE 2OZ TUBE FLIPTOP (MISCELLANEOUS) ×2 IMPLANT
SWAB COLLECTION DEVICE MRSA (MISCELLANEOUS) ×2 IMPLANT
SYR BULB IRRIGATION 50ML (SYRINGE) ×2 IMPLANT
TOWEL OR 17X24 6PK STRL BLUE (TOWEL DISPOSABLE) ×2 IMPLANT
TOWEL OR 17X26 10 PK STRL BLUE (TOWEL DISPOSABLE) ×2 IMPLANT
TUBE ANAEROBIC SPECIMEN COL (MISCELLANEOUS) ×2 IMPLANT
TUBE CONNECTING 12X1/4 (SUCTIONS) ×2 IMPLANT
UNDERPAD 30X30 INCONTINENT (UNDERPADS AND DIAPERS) ×2 IMPLANT
WATER STERILE IRR 1000ML POUR (IV SOLUTION) IMPLANT
YANKAUER SUCT BULB TIP NO VENT (SUCTIONS) ×2 IMPLANT

## 2013-09-24 NOTE — Anesthesia Postprocedure Evaluation (Signed)
  Anesthesia Post-op Note  Patient: Ashley Freeman  Procedure(s) Performed: Procedure(s) (LRB): IRRIGATION AND DEBRIDEMENT PERIRECTAL ABSCESS (Right)  Patient Location: PACU  Anesthesia Type: General  Level of Consciousness: awake and alert   Airway and Oxygen Therapy: Patient Spontanous Breathing  Post-op Pain: mild  Post-op Assessment: Post-op Vital signs reviewed, Patient's Cardiovascular Status Stable, Respiratory Function Stable, Patent Airway and No signs of Nausea or vomiting  Last Vitals:  Filed Vitals:   09/24/13 1451  BP: 134/84  Pulse: 127  Temp:   Resp: 19    Post-op Vital Signs: stable   Complications: No apparent anesthesia complications

## 2013-09-24 NOTE — Progress Notes (Signed)
Hydrotherapy Cancellation Note  Patient Details Name: Ashley Freeman MRN: [id number] DOB: 1990/08/18   Cancelled Treatment:    Reason Eval/Treat Not Completed: Pt to go to surgery today. Please clarify if hydrotherapy to resume 5/29.    [name] 09/24/2013, 8:13 AM Pager [phone]

## 2013-09-24 NOTE — Progress Notes (Signed)
IV LR started at 1205 in right wrist IV and NS infusing into right hand DC'd. Sites unremarkable. LR not scanned due to it not crossing over to [facility] when first attempted to scan.

## 2013-09-24 NOTE — Progress Notes (Signed)
Visit to patient while in hospital. She was sleeping soundly, did not awaken. Will call after patient is discharged to assist with transition of care.

## 2013-09-24 NOTE — Progress Notes (Signed)
Hydrotherapy Discharge Patient Details Name: Ashley Freeman MRN: [id number] DOB: 1990-10-18 Today's Date: 09/24/2013 Time:  -     Patient discharged from hydrotherapy services secondary to surgery. Dr. Hulen Skains discontinued hydrotherapy orders. Will need to re-order PT for hydrotherapy to resume therapy services.  Please see latest hydrotherapy progress note for current status and progress toward goals.    Progress and discharge plan discussed with patient and/or caregiver: MD discontinued hydrotherapy after surgery 5/28  GP     [name] Pager [phone]  09/24/2013, 4:19 PM

## 2013-09-24 NOTE — Transfer of Care (Signed)
Immediate Anesthesia Transfer of Care Note  Patient: Ashley Freeman  Procedure(s) Performed: Procedure(s): IRRIGATION AND DEBRIDEMENT PERIRECTAL ABSCESS (Right)  Patient Location: PACU  Anesthesia Type:General  Level of Consciousness: awake, alert , oriented and patient cooperative  Airway & Oxygen Therapy: Patient Spontanous Breathing and Patient connected to nasal cannula oxygen  Post-op Assessment: Report given to PACU RN, Post -op Vital signs reviewed and stable and Patient moving all extremities X 4  Post vital signs: Reviewed and stable  Complications: No apparent anesthesia complications

## 2013-09-24 NOTE — Progress Notes (Addendum)
Subjective: No acute events overnight.  [name] was seen and examined this AM.  Denies fever/chills.  Pain well controlled.  NPO past MN for OR (I&D) today.  CBG 468 this AM, says she has not eaten.  Objective: Vital signs in last 24 hours: Filed Vitals:   09/23/13 1100 09/23/13 1700 09/24/13 0542 09/24/13 0947  BP: 125/85 130/88 147/90 139/91  Pulse: 100 114 96 103  Temp: 98 F (36.7 C) 98 F (36.7 C) 98.2 F (36.8 C) 98.8 F (37.1 C)  TempSrc: Oral Oral Oral Oral  Resp: 19 21 18 18   Height:      Weight:      SpO2: 95% 99% 94% 94%   Weight change:   Intake/Output Summary (Last 24 hours) at 09/24/13 1146 Last data filed at 09/24/13 0839  Gross per 24 hour  Intake 4383.75 ml  Output   6350 ml  Net -1966.25 ml   General: asleep, easily arousable, in NAD HEENT: no gross abnormality Cardiac: tachycardic, regular rhythm, no rubs, murmurs or gallops Pulm:  CTA anteriorly, moving normal volumes of air Abd: soft, nontender, mild distention, BS present GU:  Foley in place draining clear urine Neuro: alert and oriented X3, responding appropriately  Lab Results: Basic Metabolic Panel:  Recent Labs Lab 09/19/13 2247 09/20/13 0320  09/23/13 0905 09/24/13 0638  NA 132* 135*  136*  < > 137 134*  K 3.9 4.0  4.0  < > 3.7 4.8  CL 97 103  102  < > 100 99  CO2 21 20  21   < > 24 21  GLUCOSE 324* 163*  163*  < > 346* 478*  BUN 6 4*  4*  < > 4* 6  CREATININE 0.57 0.50  0.50  < > 0.47* 0.42*  CALCIUM 8.5 8.1*  8.0*  < > 9.2 8.9  MG 1.7  --   --   --   --   PHOS 2.2* 2.3  --   --   --   < > = values in this interval not displayed. Liver Function Tests:  Recent Labs Lab 09/19/13 1830  AST 10  ALT <5  ALKPHOS 138*  BILITOT 0.2*  PROT 6.6  ALBUMIN 2.7*   CBC:  Recent Labs Lab 09/23/13 0905 09/24/13 0638  WBC 15.1* 12.5*  NEUTROABS 10.5* 7.8*  HGB 10.3* 9.3*  HCT 31.6* 28.5*  MCV 81.2 81.0  PLT 512* PLATELET CLUMPS NOTED ON SMEAR, COUNT APPEARS  INCREASED   CBG:  Recent Labs Lab 09/23/13 0742 09/23/13 1246 09/23/13 1638 09/23/13 2120 09/24/13 0732 09/24/13 0827  GLUCAP 407* 227* 178* 193* 444* 468*    Urinalysis:  Recent Labs Lab 09/19/13 1846 09/20/13 1130  COLORURINE STRAW* YELLOW  LABSPEC 1.036* 1.020  PHURINE 6.0 6.0  GLUCOSEU >1000* >1000*  HGBUR LARGE* NEGATIVE  BILIRUBINUR NEGATIVE NEGATIVE  KETONESUR 15* NEGATIVE  PROTEINUR NEGATIVE NEGATIVE  UROBILINOGEN 0.2 0.2  NITRITE NEGATIVE NEGATIVE  LEUKOCYTESUR MODERATE* NEGATIVE    Micro Results: Recent Results (from the past 240 hour(s))  WET PREP, GENITAL     Status: Abnormal   Collection Time    09/15/13  3:39 PM      Result Value Ref Range Status   Yeast Wet Prep HPF POC NONE SEEN  NONE SEEN Final   Trich, Wet Prep MODERATE (*) NONE SEEN Final   Clue Cells Wet Prep HPF POC FEW (*) NONE SEEN Final   WBC, Wet Prep HPF POC MODERATE (*) NONE SEEN Final  GC/CHLAMYDIA PROBE AMP     Status: None   Collection Time    09/15/13  3:39 PM      Result Value Ref Range Status   CT Probe RNA NEGATIVE  NEGATIVE Final   GC Probe RNA NEGATIVE  NEGATIVE Final   Comment: (NOTE)                                                                                               **Normal Reference Range: Negative**          Assay performed using the Gen-Probe APTIMA COMBO2 (R) Assay.     Acceptable specimen types for this assay include APTIMA Swabs (Unisex,     endocervical, urethral, or vaginal), first void urine, and ThinPrep     liquid based cytology samples.     Performed at [place]     Status: None   Collection Time    09/19/13  6:46 PM      Result Value Ref Range Status   Specimen Description URINE, CLEAN CATCH   Final   Special Requests NONE   Final   Culture  Setup Time     Final   Value: 09/20/2013 03:48     Performed at [place]     Final   Value: >=100,000 COLONIES/ML     Performed at [facility]   Culture     Final   Value: LACTOBACILLUS SPECIES     Note: Standardized susceptibility testing for this organism is not available.     Performed at [facility]   Report Status 09/21/2013 FINAL   Final  WOUND CULTURE     Status: None   Collection Time    09/19/13  8:50 PM      Result Value Ref Range Status   Specimen Description PERIRECTAL   Final   Special Requests NONE   Final   Gram Stain     Final   Value: RARE WBC PRESENT,BOTH PMN AND MONONUCLEAR     NO SQUAMOUS EPITHELIAL CELLS SEEN     RARE GRAM POSITIVE COCCI     IN CHAINS RARE GRAM NEGATIVE RODS     Performed at [facility]   Culture     Final   Value: Multiple Species Consistent With Normal Fecal Flora     Performed at [facility]   Report Status 09/22/2013 FINAL   Final  MRSA PCR SCREENING     Status: None   Collection Time    09/19/13 10:01 PM      Result Value Ref Range Status   MRSA by PCR NEGATIVE  NEGATIVE Final   Comment:            The GeneXpert MRSA Assay (FDA     approved for NASAL specimens     only), is one component of a     comprehensive MRSA colonization     surveillance program. It is not     intended to diagnose MRSA     infection nor to guide or  monitor treatment for     MRSA infections.  CULTURE, BLOOD (ROUTINE X 2)     Status: None   Collection Time    09/19/13 10:47 PM      Result Value Ref Range Status   Specimen Description BLOOD RIGHT ANTECUBITAL   Final   Special Requests BOTTLES DRAWN AEROBIC ONLY 5CC   Final   Culture  Setup Time     Final   Value: 09/20/2013 03:14     Performed at [facility]   Culture     Final   Value:        BLOOD CULTURE RECEIVED NO GROWTH TO DATE CULTURE WILL BE HELD FOR 5 DAYS BEFORE ISSUING A FINAL NEGATIVE REPORT     Performed at [facility]   Report Status PENDING   Incomplete  CULTURE, BLOOD (ROUTINE X 2)     Status: None   Collection Time    09/19/13 10:57 PM      Result Value Ref Range  Status   Specimen Description BLOOD RIGHT HAND   Final   Special Requests BOTTLES DRAWN AEROBIC ONLY 5CC   Final   Culture  Setup Time     Final   Value: 09/20/2013 03:14     Performed at [facility]   Culture     Final   Value:        BLOOD CULTURE RECEIVED NO GROWTH TO DATE CULTURE WILL BE HELD FOR 5 DAYS BEFORE ISSUING A FINAL NEGATIVE REPORT     Performed at [facility]   Report Status PENDING   Incomplete  ANAEROBIC CULTURE     Status: None   Collection Time    09/20/13  1:22 AM      Result Value Ref Range Status   Specimen Description ABSCESS RIGHT LABIA   Final   Special Requests PATIENT ON FOLLOWING VANCOMYCIN,CEFAPIME   Final   Gram Stain     Final   Value: NO WBC SEEN     NO SQUAMOUS EPITHELIAL CELLS SEEN     RARE GRAM POSITIVE COCCI     IN PAIRS     Performed at [facility]   Culture     Final   Value: NO ANAEROBES ISOLATED; CULTURE IN PROGRESS FOR 5 DAYS     Performed at [facility]   Report Status PENDING   Incomplete  CULTURE, ROUTINE-ABSCESS     Status: None   Collection Time    09/20/13  1:22 AM      Result Value Ref Range Status   Specimen Description ABSCESS RIGHT LABIA   Final   Special Requests PATIENT ON FOLLOWING VANCOMYCIN,CEFAPIME   Final   Gram Stain     Final   Value: NO WBC SEEN     NO SQUAMOUS EPITHELIAL CELLS SEEN     RARE GRAM POSITIVE COCCI     IN PAIRS     Performed at [facility]   Culture     Final   Value: RARE CANDIDA ALBICANS     Performed at [facility]   Report Status 09/23/2013 FINAL   Final   Medications: I have reviewed the patient's current medications. Scheduled Meds: . ceFEPime (MAXIPIME) IV  1 g Intravenous Q12H  . docusate sodium  100 mg Oral Daily  . enoxaparin (LOVENOX) injection  40 mg Subcutaneous Q24H  . insulin aspart  0-15 Units Subcutaneous TID WC  . insulin aspart  0-5 Units  Subcutaneous QHS  . insulin glargine  25 Units Subcutaneous Daily  . vancomycin   1,000 mg Intravenous Q8H   Continuous Infusions: . sodium chloride 1,000 mL (09/23/13 2353)   PRN Meds:.acetaminophen, dextrose, diphenhydrAMINE, morphine injection, ondansetron (ZOFRAN) IV, oxyCODONE-acetaminophen, polyethylene glycol  Assessment/Plan: 23 year old woman with DM type 1 and hx of insulin non-compliance who presents in DKA with labial and gluteal abscesses.  Resolved sepsis 2/2 right labial abscess and persistent gluteal abscess: Surgery consulted and appreciate surgical intervention.  Patient is POD#5 s/p I&D of both abscesses in OR.  She is tachycardic at baseline (looking back at old records). Urine culture + lactobacillus (likely contaminant).  Wound cultures - rare candida (labia abscess), normal fecal flora (perirectal abscess).  Surgery feels infection not well controlled --> to OR today for further I&D for better drainage.  WBC improving, 12.5 today.  She is afebrile and pain is well controlled. - continue vancomycin, cefepime per pharmacy (patient has penicillin allergy); f/u surgery recomendations - morphine IV 2 mg q4h prn  - BID wet-to-dry gauze changes to each wound  - continue foley to keep wounds clean - blood culture pending - NGTD - monitor CBC and for s&s of infection  DKA in setting of uncontrolled DM1, resolved: A1C 19.3%. On admission, glucose 705-->163, AG 19-->12, bicarb 19, + ketonuria. Lactic acid 2.9-->1.6. Leucocytosis to 19, DKA very likely precipitated by infection per above.  Off insulin gtt and transitioned to [facility].  CBGs in the 400s this AM. - continue Lantus 25 units daily and SSI moderate --> will likely need to increase once she is eating again after OR - monitor CBG  Diet:  Carb mod VTE ppx: Lovenox Code:  Full  Dispo: Disposition is deferred at this time, awaiting improvement of current medical problems.  Anticipated discharge in approximately 2-3 day(s).   The patient does have a current PCP Cresenciano Genre, MD) and does need an [facility]  hospital follow-up appointment after discharge.  The patient does not know have transportation limitations that hinder transportation to clinic appointments.  .Services Needed at time of discharge: Y = Yes, Blank = No PT:   OT:   RN:   Equipment:   Other:     LOS: 5 days   Duwaine Maxin, DO 09/24/2013, 11:46 AM  Attending physician note: I personally examined this patient today along with resident physician Dr. Duwaine Maxin and I concur with the evaluation and management plan recorded above. She will go back to the operating room this morning for a second debridement of a right gluteal abscess. Blood sugars remain poorly controlled in this type I diabetic and we will continue to make ongoing adjustments in her insulin regimen.  Murriel Hopper, MD, [place]  Hematology-Oncology/Internal Medicine

## 2013-09-24 NOTE — Progress Notes (Signed)
OR today.  Ashley Freeman. Dahlia Bailiff, MD, [place] [phone] [facility] Surgery

## 2013-09-24 NOTE — Progress Notes (Signed)
ANTIBIOTIC CONSULT NOTE - Follow Up  Pharmacy Consult for vancomycin and cefepime Indication: rule out sepsis/labial and gluteal abscess  Allergies  Allergen Reactions  . Penicillins Hives    Patient Measurements: Height: 5\' 3"  (160 cm) Weight: 123 lb 3.8 oz (55.9 kg) IBW/kg (Calculated) : 52.4   Vital Signs: Temp: 98.8 F (37.1 C) (05/28 0947) Temp src: Oral (05/28 0947) BP: 139/91 mmHg (05/28 0947) Pulse Rate: 103 (05/28 0947) Intake/Output from previous day: 05/27 0701 - 05/28 0700 In: 4863.8 [P.O.:1320; I.V.:2993.8; IV Piggyback:550] Out: 6350 [Urine:6350] Intake/Output from this shift: Total I/O In: -  Out: 2500 [Urine:2500]  Labs:  Recent Labs  09/22/13 0333 09/23/13 0905 09/24/13 0638  WBC 22.7* 15.1* 12.5*  HGB 8.9* 10.3* 9.3*  PLT 415* 512* PLATELET CLUMPS NOTED ON SMEAR, COUNT APPEARS INCREASED  CREATININE 0.46* 0.47* 0.42*   Estimated Creatinine Clearance: 92 ml/min (by C-G formula based on Cr of 0.42).  Recent Labs  09/22/13 1330 09/24/13 0845  VANCOTROUGH 7.7* 21.3*     Microbiology: Recent Results (from the past 720 hour(s))  WET PREP, GENITAL     Status: Abnormal   Collection Time    09/15/13  3:39 PM      Result Value Ref Range Status   Yeast Wet Prep HPF POC NONE SEEN  NONE SEEN Final   Trich, Wet Prep MODERATE (*) NONE SEEN Final   Clue Cells Wet Prep HPF POC FEW (*) NONE SEEN Final   WBC, Wet Prep HPF POC MODERATE (*) NONE SEEN Final  GC/CHLAMYDIA PROBE AMP     Status: None   Collection Time    09/15/13  3:39 PM      Result Value Ref Range Status   CT Probe RNA NEGATIVE  NEGATIVE Final   GC Probe RNA NEGATIVE  NEGATIVE Final   Comment: (NOTE)                                                                                               **Normal Reference Range: Negative**          Assay performed using the Gen-Probe APTIMA COMBO2 (R) Assay.     Acceptable specimen types for this assay include APTIMA Swabs (Unisex,   endocervical, urethral, or vaginal), first void urine, and ThinPrep     liquid based cytology samples.     Performed at [place]     Status: None   Collection Time    09/19/13  6:46 PM      Result Value Ref Range Status   Specimen Description URINE, CLEAN CATCH   Final   Special Requests NONE   Final   Culture  Setup Time     Final   Value: 09/20/2013 03:48     Performed at [place]     Final   Value: >=100,000 COLONIES/ML     Performed at [facility]   Culture     Final   Value: LACTOBACILLUS SPECIES     Note: Standardized susceptibility testing for this organism is not available.  Performed at [facility]   Report Status 09/21/2013 FINAL   Final  WOUND CULTURE     Status: None   Collection Time    09/19/13  8:50 PM      Result Value Ref Range Status   Specimen Description PERIRECTAL   Final   Special Requests NONE   Final   Gram Stain     Final   Value: RARE WBC PRESENT,BOTH PMN AND MONONUCLEAR     NO SQUAMOUS EPITHELIAL CELLS SEEN     RARE GRAM POSITIVE COCCI     IN CHAINS RARE GRAM NEGATIVE RODS     Performed at [facility]   Culture     Final   Value: Multiple Species Consistent With Normal Fecal Flora     Performed at [facility]   Report Status 09/22/2013 FINAL   Final  MRSA PCR SCREENING     Status: None   Collection Time    09/19/13 10:01 PM      Result Value Ref Range Status   MRSA by PCR NEGATIVE  NEGATIVE Final   Comment:            The GeneXpert MRSA Assay (FDA     approved for NASAL specimens     only), is one component of a     comprehensive MRSA colonization     surveillance program. It is not     intended to diagnose MRSA     infection nor to guide or     monitor treatment for     MRSA infections.  CULTURE, BLOOD (ROUTINE X 2)     Status: None   Collection Time    09/19/13 10:47 PM      Result Value Ref Range Status   Specimen Description BLOOD RIGHT  ANTECUBITAL   Final   Special Requests BOTTLES DRAWN AEROBIC ONLY 5CC   Final   Culture  Setup Time     Final   Value: 09/20/2013 03:14     Performed at [facility]   Culture     Final   Value:        BLOOD CULTURE RECEIVED NO GROWTH TO DATE CULTURE WILL BE HELD FOR 5 DAYS BEFORE ISSUING A FINAL NEGATIVE REPORT     Performed at [facility]   Report Status PENDING   Incomplete  CULTURE, BLOOD (ROUTINE X 2)     Status: None   Collection Time    09/19/13 10:57 PM      Result Value Ref Range Status   Specimen Description BLOOD RIGHT HAND   Final   Special Requests BOTTLES DRAWN AEROBIC ONLY 5CC   Final   Culture  Setup Time     Final   Value: 09/20/2013 03:14     Performed at [facility]   Culture     Final   Value:        BLOOD CULTURE RECEIVED NO GROWTH TO DATE CULTURE WILL BE HELD FOR 5 DAYS BEFORE ISSUING A FINAL NEGATIVE REPORT     Performed at [facility]   Report Status PENDING   Incomplete  ANAEROBIC CULTURE     Status: None   Collection Time    09/20/13  1:22 AM      Result Value Ref Range Status   Specimen Description ABSCESS RIGHT LABIA   Final   Special Requests PATIENT ON FOLLOWING VANCOMYCIN,CEFAPIME   Final   Gram Stain  Final   Value: NO WBC SEEN     NO SQUAMOUS EPITHELIAL CELLS SEEN     RARE GRAM POSITIVE COCCI     IN PAIRS     Performed at [facility]   Culture     Final   Value: NO ANAEROBES ISOLATED; CULTURE IN PROGRESS FOR 5 DAYS     Performed at [facility]   Report Status PENDING   Incomplete  CULTURE, ROUTINE-ABSCESS     Status: None   Collection Time    09/20/13  1:22 AM      Result Value Ref Range Status   Specimen Description ABSCESS RIGHT LABIA   Final   Special Requests PATIENT ON FOLLOWING VANCOMYCIN,CEFAPIME   Final   Gram Stain     Final   Value: NO WBC SEEN     NO SQUAMOUS EPITHELIAL CELLS SEEN     RARE GRAM POSITIVE COCCI     IN PAIRS     Performed at [facility]   Culture      Final   Value: RARE CANDIDA ALBICANS     Performed at [facility]   Report Status 09/23/2013 FINAL   Final    Medical History: Past Medical History  Diagnosis Date  . Gonorrhea 08/2011    Treated in 09/2011  . Diabetes mellitus 2001    Diagnosed at age 13 ; Type I  . DKA (diabetic ketoacidoses) 08/19/2013    Medications:  Prescriptions prior to admission  Medication Sig Dispense Refill  . insulin glargine (LANTUS) 100 unit/mL SOPN Inject 0.2 mLs (20 Units total) into the skin every morning.  15 mL  11  . insulin lispro (HUMALOG KWIKPEN) 100 UNIT/ML KiwkPen Inject 0.03 mLs (3 Units total) into the skin 3 (three) times daily.  15 mL  11   Assessment: 23 year old female who continues on day #5 vancomycin and cefepime for sepsis likely due to bartholin gland abscess/perirectal abscess s/p I&D on 5/23 and for I&D of gluteal abscess today. Leukocytosis improved (WBC 22.7>>12.5), and patient remains afebrile, but tachycardic in the 100s.  Vancomycin trough this afternoon, seemingly drawn appropriately, now slightly SUPRAtherapeutic at 21.3 after dose increase.   Renal function is stable: SCr 0.42, UOP 4.7, CrCl ~90 mL/min.   5/24 labia abscess>>rare CA 5/23 Blood >> ngtd 5/23 perirectal wound >> normal fecal flora 5/23 urine >> lactobacillus   Goal of Therapy:  Vancomycin trough level 15-20 mcg/ml  Plan:  - Decrease vancomycin to 1000 mg IV q8h - Check VT at steady state - Continue cefepime 1 gm IV q12h - F/u culture, sensitivities - Follow fever curve, clinical progression and LOT after I&D  Harolyn Rutherford, PharmD Clinical Pharmacist - Resident Pager: [phone] Pharmacy: [phone] 09/24/2013 10:43 AM

## 2013-09-24 NOTE — Progress Notes (Signed)
5 Days Post-Op  Subjective: Sleepy and unhappy about not eating till surgery.  I told her they could not put her to sleep if she ate or drank anything.  Objective: Vital signs in last 24 hours: Temp:  [98 F (36.7 C)-98.2 F (36.8 C)] 98.2 F (36.8 C) (05/28 0542) Pulse Rate:  [96-114] 96 (05/28 0542) Resp:  [18-21] 18 (05/28 0542) BP: (125-147)/(85-90) 147/90 mmHg (05/28 0542) SpO2:  [94 %-99 %] 94 % (05/28 0542) Last BM Date: 09/20/13 1320 PO recorded,  NPO now Afebrile yesterday WBC still up 12.5, but trending down CXR yesterday small left pleural effusion, possible pneumonia Intake/Output from previous day: 05/27 0701 - 05/28 0700 In: 4863.8 [P.O.:1320; I.V.:2993.8; IV Piggyback:550] Out: [id number] [Urine:6350] Intake/Output this shift:    General appearance: sleepy and unhappy about being NPO for surgery.  Lab Results:   Recent Labs  09/23/13 0905 09/24/13 0638  WBC 15.1* 12.5*  HGB 10.3* 9.3*  HCT 31.6* 28.5*  PLT 512* PENDING    BMET  Recent Labs  09/22/13 0333 09/23/13 0905  NA 136* 137  K 4.0 3.7  CL 102 100  CO2 24 24  GLUCOSE 287* 346*  BUN 3* 4*  CREATININE 0.46* 0.47*  CALCIUM 8.6 9.2   PT/INR No results found for this basename: LABPROT, INR,  in the last 72 hours   Recent Labs Lab 09/19/13 1830  AST 10  ALT <5  ALKPHOS 138*  BILITOT 0.2*  PROT 6.6  ALBUMIN 2.7*     Lipase     Component Value Date/Time   LIPASE 28 07/05/2013 1218     Studies/Results: Dg Chest Port 1 View  09/24/2013   CLINICAL DATA:  Sudden onset of mid chest pain.  EXAM: PORTABLE CHEST - 1 VIEW  COMPARISON:  Chest radiograph performed 08/16/2013  FINDINGS: The lungs are hypoexpanded. Left basilar airspace opacification raises concern for pneumonia. Vascular congestion is noted; asymmetric interstitial edema might have a similar appearance. A small left pleural effusion is suspected. No pneumothorax is seen.  The cardiomediastinal silhouette is borderline normal in  size. No acute osseous abnormalities are seen.  IMPRESSION: Lungs hypoexpanded. Left basilar airspace opacification raises concern for pneumonia. Suspect small left pleural effusion. Vascular congestion noted; asymmetric interstitial edema might have a similar appearance.   Electronically Signed   By: Garald Balding M.D.   On: 09/24/2013 03:24    Medications: . ceFEPime (MAXIPIME) IV  1 g Intravenous Q12H  . docusate sodium  100 mg Oral Daily  . enoxaparin (LOVENOX) injection  40 mg Subcutaneous Q24H  . insulin aspart  0-15 Units Subcutaneous TID WC  . insulin aspart  0-5 Units Subcutaneous QHS  . insulin glargine  25 Units Subcutaneous Daily  . vancomycin  1,000 mg Intravenous Q6H    Assessment/Plan Right gluteal abscess and labial abscesses  I&D right gluteal abscess 08/18/13 Dr. Ralene Ok Type I diabetes mellitus, uncontrolled (diagnosed age 23)   Plan:  To OR later today for debridement.     LOS: 5 days    Ashley Freeman 09/24/2013

## 2013-09-24 NOTE — Op Note (Signed)
OPERATIVE REPORT  DATE OF OPERATION: 09/19/2013 - 09/24/2013  PATIENT:  Ashley Freeman  23 y.o. female  PRE-OPERATIVE DIAGNOSIS:  Poorly drained perirectal abscess  POST-OPERATIVE DIAGNOSIS:  Poorly drained perirectal abscess  PROCEDURE:  Procedure(s): IRRIGATION AND DEBRIDEMENT PERIRECTAL ABSCESS with packing of two bottle of 1/2 inch iodinated ribbon gauze  SURGEON:  Surgeon(s): Gwenyth Ober, MD  ASSISTANT: None  ANESTHESIA:   general  EBL: <20 ml  BLOOD ADMINISTERED: none  DRAINS: none   SPECIMEN:  Source of Specimen:  Perirectal abscess for microbiology, aerobic and anaerobic  COUNTS CORRECT:  YES  PROCEDURE DETAILS: The patient was taken to the operating room and placed on the table initially in the supine position. After an adequate general endotracheal anesthetic was administered she was flipped into the prone position and prepped and draped in the usual sterile manner.  After a proper timeout was performed identifying the patient and the procedure to be performed, a culture was taken of the drainage from the perirectal area. The patient had marked induration along the gluteal ridge on the right side. Upon pressing in that area pus exuded from the soft tissue and subcutaneous tissue.  There were necrotic pockets superiorly and inferiorly. We opened up this area while in order to provide drainage of the purulent area. After opening up the wound is mildly to the tune of approximately 5 cm we will able to pack 2 bottles of half-inch iodoform ribbon gauze.  Hemostasis was obtained with packing and electrocautery. A sterile dressing was applied. All needle counts, sponge counts, and instrument counts were correct.  PATIENT DISPOSITION:  PACU - hemodynamically stable.   Gwenyth Ober 5/28/20152:09 PM

## 2013-09-24 NOTE — Anesthesia Preprocedure Evaluation (Signed)
Anesthesia Evaluation  Patient identified by MRN, date of birth, ID band Patient awake    Reviewed: Allergy & Precautions, H&P , NPO status , Patient's Chart, lab work & pertinent test results  Airway Mallampati: II TM Distance: >3 FB Neck ROM: Full    Dental  (+) Teeth Intact, Dental Advisory Given   Pulmonary  breath sounds clear to auscultation        Cardiovascular Rhythm:Regular Rate:Normal     Neuro/Psych    GI/Hepatic   Endo/Other  diabetes  Renal/GU      Musculoskeletal   Abdominal   Peds  Hematology   Anesthesia Other Findings   Reproductive/Obstetrics                           Anesthesia Physical Anesthesia Plan  ASA: III  Anesthesia Plan: General   Post-op Pain Management:    Induction: Intravenous  Airway Management Planned: Oral ETT  Additional Equipment:   Intra-op Plan:   Post-operative Plan: Extubation in OR  Informed Consent: I have reviewed the patients History and Physical, chart, labs and discussed the procedure including the risks, benefits and alternatives for the proposed anesthesia with the patient or authorized representative who has indicated his/her understanding and acceptance.   Dental advisory given  Plan Discussed with: CRNA and Anesthesiologist  Anesthesia Plan Comments: ( R. Perirectal and labial abscesses S/P I and D 5/24 Type 1 DM poorly controlled glucose 273 no evidence of DKA at present  Plan GA with oral ETT  Roberts Gaudy )        Anesthesia Quick Evaluation

## 2013-09-24 NOTE — Progress Notes (Signed)
Pt returned from the OR via bed. Pt VS stable. Complained of pain 8 out 10. Dressing dry and intact. Pt tearful complaining of itching. She was given some benadryl and percocet. Will cont to monitor.

## 2013-09-24 NOTE — Anesthesia Procedure Notes (Signed)
Procedure Name: Intubation Date/Time: 09/24/2013 1:29 PM Performed by: Melina Copa, Anyiah Coverdale R Pre-anesthesia Checklist: Patient identified, Emergency Drugs available, Suction available, Patient being monitored and Timeout performed Patient Re-evaluated:Patient Re-evaluated prior to inductionOxygen Delivery Method: Circle system utilized Preoxygenation: Pre-oxygenation with 100% oxygen Intubation Type: IV induction Ventilation: Mask ventilation without difficulty Laryngoscope Size: Mac and 3 Grade View: Grade I Tube type: Oral Tube size: 7.5 mm Number of attempts: 2 Airway Equipment and Method: Stylet Placement Confirmation: ETT inserted through vocal cords under direct vision,  positive ETCO2 and breath sounds checked- equal and bilateral Secured at: 20 cm Tube secured with: Tape Dental Injury: Teeth and Oropharynx as per pre-operative assessment

## 2013-09-25 LAB — ANAEROBIC CULTURE: Gram Stain: NONE SEEN

## 2013-09-25 LAB — CBC WITH DIFFERENTIAL/PLATELET
BASOS ABS: 0.1 10*3/uL (ref 0.0–0.1)
BASOS PCT: 0 % (ref 0–1)
Eosinophils Absolute: 0.2 10*3/uL (ref 0.0–0.7)
Eosinophils Relative: 1 % (ref 0–5)
HCT: 29.9 % — ABNORMAL LOW (ref 36.0–46.0)
Hemoglobin: 9.3 g/dL — ABNORMAL LOW (ref 12.0–15.0)
Lymphocytes Relative: 19 % (ref 12–46)
Lymphs Abs: 3.2 10*3/uL (ref 0.7–4.0)
MCH: 25.8 pg — ABNORMAL LOW (ref 26.0–34.0)
MCHC: 31.1 g/dL (ref 30.0–36.0)
MCV: 83.1 fL (ref 78.0–100.0)
MONOS PCT: 9 % (ref 3–12)
Monocytes Absolute: 1.6 10*3/uL — ABNORMAL HIGH (ref 0.1–1.0)
NEUTROS ABS: 11.7 10*3/uL — AB (ref 1.7–7.7)
NEUTROS PCT: 71 % (ref 43–77)
Platelets: 551 10*3/uL — ABNORMAL HIGH (ref 150–400)
RBC: 3.6 MIL/uL — ABNORMAL LOW (ref 3.87–5.11)
RDW: 14.7 % (ref 11.5–15.5)
WBC: 16.8 10*3/uL — ABNORMAL HIGH (ref 4.0–10.5)

## 2013-09-25 LAB — GLUCOSE, CAPILLARY
Glucose-Capillary: 312 mg/dL — ABNORMAL HIGH (ref 70–99)
Glucose-Capillary: 231 mg/dL — ABNORMAL HIGH (ref 70–99)
Glucose-Capillary: 330 mg/dL — ABNORMAL HIGH (ref 70–99)
Glucose-Capillary: 220 mg/dL — ABNORMAL HIGH (ref 70–99)

## 2013-09-25 LAB — VANCOMYCIN, TROUGH: Vancomycin Tr: 10.7 ug/mL (ref 10.0–20.0)

## 2013-09-25 MED ORDER — INSULIN GLARGINE 100 UNIT/ML ~~LOC~~ SOLN
30.0000 [IU] | Freq: Every day | SUBCUTANEOUS | Status: DC
  Administered 2013-09-26 – 2013-09-27 (×2): 30 [IU] via SUBCUTANEOUS
  Filled 2013-09-25 (×4): qty 0.3

## 2013-09-25 MED ORDER — VANCOMYCIN HCL 10 G IV SOLR
1250.0000 mg | Freq: Three times a day (TID) | INTRAVENOUS | Status: DC
  Administered 2013-09-25 – 2013-09-27 (×5): 1250 mg via INTRAVENOUS
  Filled 2013-09-25 (×6): qty 1250

## 2013-09-25 MED ORDER — SODIUM CHLORIDE 0.9 % IV SOLN
1000.0000 mL | INTRAVENOUS | Status: DC
  Administered 2013-09-25 – 2013-09-28 (×3): 1000 mL via INTRAVENOUS

## 2013-09-25 MED ORDER — DOCUSATE SODIUM 100 MG PO CAPS
100.0000 mg | ORAL_CAPSULE | Freq: Two times a day (BID) | ORAL | Status: DC
  Administered 2013-09-25 – 2013-09-27 (×5): 100 mg via ORAL
  Filled 2013-09-25 (×6): qty 1

## 2013-09-25 NOTE — Progress Notes (Addendum)
ANTIBIOTIC CONSULT NOTE - Follow Up  Pharmacy Consult for vancomycin and cefepime Indication: rule out sepsis/labial and gluteal abscess  Allergies  Allergen Reactions  . Penicillins Hives    Patient Measurements: Height: 5\' 3"  (160 cm) Weight: 123 lb 3.8 oz (55.9 kg) IBW/kg (Calculated) : 52.4   Vital Signs: Temp: 98.4 F (36.9 C) (05/29 0900) Temp src: Oral (05/29 0900) BP: 120/62 mmHg (05/29 0900) Pulse Rate: 90 (05/29 0900) Intake/Output from previous day: 05/28 0701 - 05/29 0700 In: 2734.6 [P.O.:520; I.V.:2014.6; IV Piggyback:200] Out: 5750 [Urine:5750] Intake/Output from this shift: Total I/O In: 240 [P.O.:240] Out: -   Labs:  Recent Labs  09/23/13 0905 09/24/13 0638 09/25/13 0810  WBC 15.1* 12.5* 16.8*  HGB 10.3* 9.3* 9.3*  PLT 512* PLATELET CLUMPS NOTED ON SMEAR, COUNT APPEARS INCREASED 551*  CREATININE 0.47* 0.42*  --    Estimated Creatinine Clearance: 92 ml/min (by C-G formula based on Cr of 0.42).  Recent Labs  09/24/13 0845 09/25/13 1530  VANCOTROUGH 21.3* 10.7     Microbiology: Recent Results (from the past 720 hour(s))  WET PREP, GENITAL     Status: Abnormal   Collection Time    09/15/13  3:39 PM      Result Value Ref Range Status   Yeast Wet Prep HPF POC NONE SEEN  NONE SEEN Final   Trich, Wet Prep MODERATE (*) NONE SEEN Final   Clue Cells Wet Prep HPF POC FEW (*) NONE SEEN Final   WBC, Wet Prep HPF POC MODERATE (*) NONE SEEN Final  GC/CHLAMYDIA PROBE AMP     Status: None   Collection Time    09/15/13  3:39 PM      Result Value Ref Range Status   CT Probe RNA NEGATIVE  NEGATIVE Final   GC Probe RNA NEGATIVE  NEGATIVE Final   Comment: (NOTE)                                                                                               **Normal Reference Range: Negative**          Assay performed using the Gen-Probe APTIMA COMBO2 (R) Assay.     Acceptable specimen types for this assay include APTIMA Swabs (Unisex,     endocervical,  urethral, or vaginal), first void urine, and ThinPrep     liquid based cytology samples.     Performed at [place]     Status: None   Collection Time    09/19/13  6:46 PM      Result Value Ref Range Status   Specimen Description URINE, CLEAN CATCH   Final   Special Requests NONE   Final   Culture  Setup Time     Final   Value: 09/20/2013 03:48     Performed at [place]     Final   Value: >=100,000 COLONIES/ML     Performed at [facility]   Culture     Final   Value: LACTOBACILLUS SPECIES     Note: Standardized susceptibility testing for this organism is  not available.     Performed at [facility]   Report Status 09/21/2013 FINAL   Final  WOUND CULTURE     Status: None   Collection Time    09/19/13  8:50 PM      Result Value Ref Range Status   Specimen Description PERIRECTAL   Final   Special Requests NONE   Final   Gram Stain     Final   Value: RARE WBC PRESENT,BOTH PMN AND MONONUCLEAR     NO SQUAMOUS EPITHELIAL CELLS SEEN     RARE GRAM POSITIVE COCCI     IN CHAINS RARE GRAM NEGATIVE RODS     Performed at [facility]   Culture     Final   Value: Multiple Species Consistent With Normal Fecal Flora     Performed at [facility]   Report Status 09/22/2013 FINAL   Final  MRSA PCR SCREENING     Status: None   Collection Time    09/19/13 10:01 PM      Result Value Ref Range Status   MRSA by PCR NEGATIVE  NEGATIVE Final   Comment:            The GeneXpert MRSA Assay (FDA     approved for NASAL specimens     only), is one component of a     comprehensive MRSA colonization     surveillance program. It is not     intended to diagnose MRSA     infection nor to guide or     monitor treatment for     MRSA infections.  CULTURE, BLOOD (ROUTINE X 2)     Status: None   Collection Time    09/19/13 10:47 PM      Result Value Ref Range Status   Specimen Description BLOOD RIGHT ANTECUBITAL    Final   Special Requests BOTTLES DRAWN AEROBIC ONLY 5CC   Final   Culture  Setup Time     Final   Value: 09/20/2013 03:14     Performed at [facility]   Culture     Final   Value:        BLOOD CULTURE RECEIVED NO GROWTH TO DATE CULTURE WILL BE HELD FOR 5 DAYS BEFORE ISSUING A FINAL NEGATIVE REPORT     Performed at [facility]   Report Status PENDING   Incomplete  CULTURE, BLOOD (ROUTINE X 2)     Status: None   Collection Time    09/19/13 10:57 PM      Result Value Ref Range Status   Specimen Description BLOOD RIGHT HAND   Final   Special Requests BOTTLES DRAWN AEROBIC ONLY 5CC   Final   Culture  Setup Time     Final   Value: 09/20/2013 03:14     Performed at [facility]   Culture     Final   Value:        BLOOD CULTURE RECEIVED NO GROWTH TO DATE CULTURE WILL BE HELD FOR 5 DAYS BEFORE ISSUING A FINAL NEGATIVE REPORT     Performed at [facility]   Report Status PENDING   Incomplete  ANAEROBIC CULTURE     Status: None   Collection Time    09/20/13  1:22 AM      Result Value Ref Range Status   Specimen Description ABSCESS RIGHT LABIA   Final   Special Requests PATIENT ON FOLLOWING VANCOMYCIN,CEFAPIME   Final  Gram Stain     Final   Value: NO WBC SEEN     NO SQUAMOUS EPITHELIAL CELLS SEEN     RARE GRAM POSITIVE COCCI     IN PAIRS     Performed at [facility]   Culture     Final   Value: NO ANAEROBES ISOLATED     Performed at [facility]   Report Status 09/25/2013 FINAL   Final  CULTURE, ROUTINE-ABSCESS     Status: None   Collection Time    09/20/13  1:22 AM      Result Value Ref Range Status   Specimen Description ABSCESS RIGHT LABIA   Final   Special Requests PATIENT ON FOLLOWING VANCOMYCIN,CEFAPIME   Final   Gram Stain     Final   Value: NO WBC SEEN     NO SQUAMOUS EPITHELIAL CELLS SEEN     RARE GRAM POSITIVE COCCI     IN PAIRS     Performed at [facility]   Culture     Final   Value: RARE CANDIDA ALBICANS      Performed at [facility]   Report Status 09/23/2013 FINAL   Final  CULTURE, ROUTINE-ABSCESS     Status: None   Collection Time    09/24/13  1:44 PM      Result Value Ref Range Status   Specimen Description ABSCESS PERIRECTAL   Final   Special Requests PATIENT ON FOLLOWING VANCOMYCIN   Final   Gram Stain     Final   Value: ABUNDANT WBC PRESENT, PREDOMINANTLY PMN     NO SQUAMOUS EPITHELIAL CELLS SEEN     NO ORGANISMS SEEN     Performed at [facility]   Culture     Final   Value: NO GROWTH 1 DAY     Performed at [facility]   Report Status PENDING   Incomplete  ANAEROBIC CULTURE     Status: None   Collection Time    09/24/13  1:44 PM      Result Value Ref Range Status   Specimen Description ABSCESS PERIRECTAL   Final   Special Requests PATIENT ON FOLLOWING VANCOMYCIN   Final   Gram Stain     Final   Value: ABUNDANT WBC PRESENT, PREDOMINANTLY PMN     NO SQUAMOUS EPITHELIAL CELLS SEEN     NO ORGANISMS SEEN     Performed at [facility]   Culture     Final   Value: NO ANAEROBES ISOLATED; CULTURE IN PROGRESS FOR 5 DAYS     Performed at [facility]   Report Status PENDING   Incomplete    Medical History: Past Medical History  Diagnosis Date  . Gonorrhea 08/2011    Treated in 09/2011  . Diabetes mellitus 2001    Diagnosed at age 25 ; Type I  . DKA (diabetic ketoacidoses) 08/19/2013    Medications:  Prescriptions prior to admission  Medication Sig Dispense Refill  . insulin glargine (LANTUS) 100 unit/mL SOPN Inject 0.2 mLs (20 Units total) into the skin every morning.  15 mL  11  . insulin lispro (HUMALOG KWIKPEN) 100 UNIT/ML KiwkPen Inject 0.03 mLs (3 Units total) into the skin 3 (three) times daily.  15 mL  11   Assessment: 23 year old female who continues on day #6 vancomycin and cefepime for sepsis likely due to bartholin gland abscess/perirectal abscess s/p I&D on 5/23 and I&D of gluteal  abscess on 5/28. WBC trend back up  (WBC 12.5>16.8) and patient remains afebrile, but tachycardic in the 100s.  Vancomycin trough this afternoon, seemingly drawn appropriately, now SUBtherapeutic at 10.7 after dose decrease.   Renal function is stable: SCr 0.42, UOP 4.7, CrCl ~90 mL/min.   5/28 perirectal abscess>>NGTD 5/24 labia abscess>>rare CA 5/23 Blood >> ngtd 5/23 perirectal wound >> normal fecal flora 5/23 urine >> lactobacillus   Goal of Therapy:  Vancomycin trough level 15-20 mcg/ml  Plan:  - Increase vancomycin to 1250 mg IV q8h - Check VT at steady state - Continue cefepime 1 gm IV q12h - F/u culture, sensitivities - Follow WBC, fever curve, clinical progression and LOT   Harolyn Rutherford, PharmD Clinical Pharmacist - Resident Pager: [phone] Pharmacy: [phone] 09/25/2013 5:18 PM

## 2013-09-25 NOTE — Progress Notes (Addendum)
Subjective: No acute events overnight.  Patient seen and examined this AM.  Appetite is good and she has eaten breakfast.  Objective: Vital signs in last 24 hours: Filed Vitals:   09/24/13 1600 09/24/13 1958 09/25/13 0524 09/25/13 0900  BP: 118/62 129/82 115/63 120/62  Pulse: 118 109 106 90  Temp: 97.8 F (36.6 C) 99 F (37.2 C) 99 F (37.2 C) 98.4 F (36.9 C)  TempSrc: Oral   Oral  Resp: 16 18 17 18   Height:      Weight:      SpO2: 96% 98% 93%    Weight change:   Intake/Output Summary (Last 24 hours) at 09/25/13 1206 Last data filed at 09/25/13 1035  Gross per 24 hour  Intake 2974.58 ml  Output   3250 ml  Net -275.42 ml   General: in bed in NAD HEENT: no gross abnormality Cardiac: tachycardic, regular rhythm, no rubs, murmurs or gallops Pulm:  CTA B/L, moving normal volumes of air Abd: soft, nontender, nondistended, BS present GU:  Foley in place draining clear urine Neuro: alert and oriented X3, responding appropriately, able to move all extremities  Lab Results: Basic Metabolic Panel:  Recent Labs Lab 09/19/13 2247 09/20/13 0320  09/23/13 0905 09/24/13 0638  NA 132* 135*  136*  < > 137 134*  K 3.9 4.0  4.0  < > 3.7 4.8  CL 97 103  102  < > 100 99  CO2 21 20  21   < > 24 21  GLUCOSE 324* 163*  163*  < > 346* 478*  BUN 6 4*  4*  < > 4* 6  CREATININE 0.57 0.50  0.50  < > 0.47* 0.42*  CALCIUM 8.5 8.1*  8.0*  < > 9.2 8.9  MG 1.7  --   --   --   --   PHOS 2.2* 2.3  --   --   --   < > = values in this interval not displayed. Liver Function Tests:  Recent Labs Lab 09/19/13 1830  AST 10  ALT <5  ALKPHOS 138*  BILITOT 0.2*  PROT 6.6  ALBUMIN 2.7*   CBC:  Recent Labs Lab 09/24/13 0638 09/25/13 0810  WBC 12.5* 16.8*  NEUTROABS 7.8* 11.7*  HGB 9.3* 9.3*  HCT 28.5* 29.9*  MCV 81.0 83.1  PLT PLATELET CLUMPS NOTED ON SMEAR, COUNT APPEARS INCREASED 551*   CBG:  Recent Labs Lab 09/24/13 1205 09/24/13 1427 09/24/13 1558 09/24/13 1822  09/24/13 1955 09/25/13 0745  GLUCAP 237* 131* 76 159* 174* 312*    Urinalysis:  Recent Labs Lab 09/19/13 1846 09/20/13 1130  COLORURINE STRAW* YELLOW  LABSPEC 1.036* 1.020  PHURINE 6.0 6.0  GLUCOSEU >1000* >1000*  HGBUR LARGE* NEGATIVE  BILIRUBINUR NEGATIVE NEGATIVE  KETONESUR 15* NEGATIVE  PROTEINUR NEGATIVE NEGATIVE  UROBILINOGEN 0.2 0.2  NITRITE NEGATIVE NEGATIVE  LEUKOCYTESUR MODERATE* NEGATIVE    Micro Results: Recent Results (from the past 240 hour(s))  WET PREP, GENITAL     Status: Abnormal   Collection Time    09/15/13  3:39 PM      Result Value Ref Range Status   Yeast Wet Prep HPF POC NONE SEEN  NONE SEEN Final   Trich, Wet Prep MODERATE (*) NONE SEEN Final   Clue Cells Wet Prep HPF POC FEW (*) NONE SEEN Final   WBC, Wet Prep HPF POC MODERATE (*) NONE SEEN Final  GC/CHLAMYDIA PROBE AMP     Status: None   Collection Time  09/15/13  3:39 PM      Result Value Ref Range Status   CT Probe RNA NEGATIVE  NEGATIVE Final   GC Probe RNA NEGATIVE  NEGATIVE Final   Comment: (NOTE)                                                                                               **Normal Reference Range: Negative**          Assay performed using the Gen-Probe APTIMA COMBO2 (R) Assay.     Acceptable specimen types for this assay include APTIMA Swabs (Unisex,     endocervical, urethral, or vaginal), first void urine, and ThinPrep     liquid based cytology samples.     Performed at [place]     Status: None   Collection Time    09/19/13  6:46 PM      Result Value Ref Range Status   Specimen Description URINE, CLEAN CATCH   Final   Special Requests NONE   Final   Culture  Setup Time     Final   Value: 09/20/2013 03:48     Performed at [place]     Final   Value: >=100,000 COLONIES/ML     Performed at [facility]   Culture     Final   Value: LACTOBACILLUS SPECIES     Note: Standardized susceptibility  testing for this organism is not available.     Performed at [facility]   Report Status 09/21/2013 FINAL   Final  WOUND CULTURE     Status: None   Collection Time    09/19/13  8:50 PM      Result Value Ref Range Status   Specimen Description PERIRECTAL   Final   Special Requests NONE   Final   Gram Stain     Final   Value: RARE WBC PRESENT,BOTH PMN AND MONONUCLEAR     NO SQUAMOUS EPITHELIAL CELLS SEEN     RARE GRAM POSITIVE COCCI     IN CHAINS RARE GRAM NEGATIVE RODS     Performed at [facility]   Culture     Final   Value: Multiple Species Consistent With Normal Fecal Flora     Performed at [facility]   Report Status 09/22/2013 FINAL   Final  MRSA PCR SCREENING     Status: None   Collection Time    09/19/13 10:01 PM      Result Value Ref Range Status   MRSA by PCR NEGATIVE  NEGATIVE Final   Comment:            The GeneXpert MRSA Assay (FDA     approved for NASAL specimens     only), is one component of a     comprehensive MRSA colonization     surveillance program. It is not     intended to diagnose MRSA     infection nor to guide or     monitor treatment for     MRSA infections.  CULTURE, BLOOD (ROUTINE  X 2)     Status: None   Collection Time    09/19/13 10:47 PM      Result Value Ref Range Status   Specimen Description BLOOD RIGHT ANTECUBITAL   Final   Special Requests BOTTLES DRAWN AEROBIC ONLY 5CC   Final   Culture  Setup Time     Final   Value: 09/20/2013 03:14     Performed at [facility]   Culture     Final   Value:        BLOOD CULTURE RECEIVED NO GROWTH TO DATE CULTURE WILL BE HELD FOR 5 DAYS BEFORE ISSUING A FINAL NEGATIVE REPORT     Performed at [facility]   Report Status PENDING   Incomplete  CULTURE, BLOOD (ROUTINE X 2)     Status: None   Collection Time    09/19/13 10:57 PM      Result Value Ref Range Status   Specimen Description BLOOD RIGHT HAND   Final   Special Requests BOTTLES DRAWN AEROBIC ONLY  5CC   Final   Culture  Setup Time     Final   Value: 09/20/2013 03:14     Performed at [facility]   Culture     Final   Value:        BLOOD CULTURE RECEIVED NO GROWTH TO DATE CULTURE WILL BE HELD FOR 5 DAYS BEFORE ISSUING A FINAL NEGATIVE REPORT     Performed at [facility]   Report Status PENDING   Incomplete  ANAEROBIC CULTURE     Status: None   Collection Time    09/20/13  1:22 AM      Result Value Ref Range Status   Specimen Description ABSCESS RIGHT LABIA   Final   Special Requests PATIENT ON FOLLOWING VANCOMYCIN,CEFAPIME   Final   Gram Stain     Final   Value: NO WBC SEEN     NO SQUAMOUS EPITHELIAL CELLS SEEN     RARE GRAM POSITIVE COCCI     IN PAIRS     Performed at [facility]   Culture     Final   Value: NO ANAEROBES ISOLATED     Performed at [facility]   Report Status 09/25/2013 FINAL   Final  CULTURE, ROUTINE-ABSCESS     Status: None   Collection Time    09/20/13  1:22 AM      Result Value Ref Range Status   Specimen Description ABSCESS RIGHT LABIA   Final   Special Requests PATIENT ON FOLLOWING VANCOMYCIN,CEFAPIME   Final   Gram Stain     Final   Value: NO WBC SEEN     NO SQUAMOUS EPITHELIAL CELLS SEEN     RARE GRAM POSITIVE COCCI     IN PAIRS     Performed at [facility]   Culture     Final   Value: RARE CANDIDA ALBICANS     Performed at [facility]   Report Status 09/23/2013 FINAL   Final  CULTURE, ROUTINE-ABSCESS     Status: None   Collection Time    09/24/13  1:44 PM      Result Value Ref Range Status   Specimen Description ABSCESS PERIRECTAL   Final   Special Requests PATIENT ON FOLLOWING VANCOMYCIN   Final   Gram Stain     Final   Value: ABUNDANT WBC PRESENT, PREDOMINANTLY PMN     NO SQUAMOUS EPITHELIAL CELLS  SEEN     NO ORGANISMS SEEN     Performed at [facility]   Culture     Final   Value: NO GROWTH 1 DAY     Performed at [facility]   Report Status PENDING   Incomplete   ANAEROBIC CULTURE     Status: None   Collection Time    09/24/13  1:44 PM      Result Value Ref Range Status   Specimen Description ABSCESS PERIRECTAL   Final   Special Requests PATIENT ON FOLLOWING VANCOMYCIN   Final   Gram Stain     Final   Value: ABUNDANT WBC PRESENT, PREDOMINANTLY PMN     NO SQUAMOUS EPITHELIAL CELLS SEEN     NO ORGANISMS SEEN     Performed at [facility]   Culture     Final   Value: NO ANAEROBES ISOLATED; CULTURE IN PROGRESS FOR 5 DAYS     Performed at [facility]   Report Status PENDING   Incomplete   Medications: I have reviewed the patient's current medications. Scheduled Meds: . ceFEPime (MAXIPIME) IV  1 g Intravenous Q12H  . docusate sodium  100 mg Oral BID  . enoxaparin (LOVENOX) injection  40 mg Subcutaneous Q24H  . insulin aspart  0-15 Units Subcutaneous TID WC  . insulin aspart  0-5 Units Subcutaneous QHS  . insulin glargine  25 Units Subcutaneous Daily  . vancomycin  1,000 mg Intravenous Q8H   Continuous Infusions: . sodium chloride 1,000 mL (09/24/13 2347)   PRN Meds:.acetaminophen, dextrose, diphenhydrAMINE, morphine injection, ondansetron (ZOFRAN) IV, oxyCODONE-acetaminophen, polyethylene glycol  Assessment/Plan: 23 year old woman with DM type 1 and hx of insulin non-compliance who presents in DKA with labial and gluteal abscesses.  Resolved sepsis 2/2 right labial abscess and persistent gluteal abscess: Surgery consulted and appreciate surgical intervention.  Patient had I&D of both abscesses in OR on 05/24.  She is tachycardic at baseline (looking back at old records).  Urine culture + lactobacillus (likely contaminant).  05/24 wound cultures - rare candida (labia abscess), normal fecal flora (perirectal abscess).  She went back to OR for further debridement on 05/28 and is POD #1.  WBC 16.8.  She is afebrile and getting medication for pain control.  05/28 wound cultures pending - NGTD. - continue vancomycin, cefepime per  pharmacy (patient has penicillin allergy); f/u surgery recomendations - morphine IV 2 mg q4h prn  - change dressing tomorrow morning (05/30), per Surgery - continue foley to keep wounds clean - blood culture pending - NGTD - monitor CBC and for s&s of infection  DKA in setting of uncontrolled DM1, resolved: A1C 19.3%. On admission, glucose 705-->163, AG 19-->12, bicarb 19, + ketonuria. Lactic acid 2.9-->1.6. Leucocytosis to 19, DKA very likely precipitated by infection per above.  Off insulin gtt and transitioned to [facility]  CBGs 70s-300 in the past 24 hours.   - continue Lantus 25 units daily and SSI moderate. - monitor CBG  Diet:  Carb mod VTE ppx: Lovenox Code:  Full  Dispo: Disposition is deferred at this time, awaiting improvement of current medical problems.  Anticipated discharge in approximately 2-3 day(s).   The patient does have a current PCP Cresenciano Genre, MD) and does need an [facility] hospital follow-up appointment after discharge.  The patient does not know have transportation limitations that hinder transportation to clinic appointments.  .Services Needed at time of discharge: Y = Yes, Blank = No PT:  OT:   RN:   Equipment:   Other:     LOS: 6 days   Duwaine Maxin, DO 09/25/2013, 12:06 PM Attending physician note: I personally examined this patient along with resident physician Dr. Duwaine Maxin and I concur with her evaluation and management plan outlined above. The patient had a second abscess evacuation and debridement procedure yesterday and tolerated this well. Murriel Hopper, MD, [place]  Hematology-Oncology/Internal Medicine

## 2013-09-25 NOTE — Progress Notes (Signed)
1 Day Post-Op  Subjective: She has been able to eat, hasn't walked since surgery.  Dressing is intact, some drainage seen thru Abd, but not much.  Objective: Vital signs in last 24 hours: Temp:  [97.8 F (36.6 C)-99 F (37.2 C)] 99 F (37.2 C) (05/29 0524) Pulse Rate:  [90-137] 106 (05/29 0524) Resp:  [16-25] 17 (05/29 0524) BP: (115-146)/(62-91) 115/63 mmHg (05/29 0524) SpO2:  [93 %-99 %] 93 % (05/29 0524) Last BM Date: 09/20/13 520 PO recorded No Bm Afebrile, HR up some but BP stable No labs Glucose 76-312 Intake/Output from previous day: 05/28 0701 - 05/29 0700 In: 2734.6 [P.O.:520; I.V.:2014.6; IV Piggyback:200] Out: 5750 [Urine:5750] Intake/Output this shift:    General appearance: alert, cooperative and no distress dressing intact  Lab Results:   Recent Labs  09/23/13 0905 09/24/13 0638  WBC 15.1* 12.5*  HGB 10.3* 9.3*  HCT 31.6* 28.5*  PLT 512* PLATELET CLUMPS NOTED ON SMEAR, COUNT APPEARS INCREASED    BMET  Recent Labs  09/23/13 0905 09/24/13 0638  NA 137 134*  K 3.7 4.8  CL 100 99  CO2 24 21  GLUCOSE 346* 478*  BUN 4* 6  CREATININE 0.47* 0.42*  CALCIUM 9.2 8.9   PT/INR No results found for this basename: LABPROT, INR,  in the last 72 hours   Recent Labs Lab 09/19/13 1830  AST 10  ALT <5  ALKPHOS 138*  BILITOT 0.2*  PROT 6.6  ALBUMIN 2.7*     Lipase     Component Value Date/Time   LIPASE 28 07/05/2013 1218     Studies/Results: Dg Chest 2 View  09/24/2013   CLINICAL DATA:  Chest pain and cough  EXAM: CHEST  2 VIEW  COMPARISON:  Sep 23, 2013  FINDINGS: There has been significant interval clearing of consolidation from the left lung. There does remain patchy infiltrate in the left mid and lower lung zones, however. The right lung is clear. Heart size and pulmonary vascularity are normal. No adenopathy. No bone lesions.  IMPRESSION: Partial clearing of infiltrate from the left lung. There is still patchy infiltrate in the left mid and  lower lung zone regions. No new opacity. Right lung is clear.   Electronically Signed   By: Lowella Grip M.D.   On: 09/24/2013 08:47   Dg Chest Port 1 View  09/24/2013   CLINICAL DATA:  Sudden onset of mid chest pain.  EXAM: PORTABLE CHEST - 1 VIEW  COMPARISON:  Chest radiograph performed 08/16/2013  FINDINGS: The lungs are hypoexpanded. Left basilar airspace opacification raises concern for pneumonia. Vascular congestion is noted; asymmetric interstitial edema might have a similar appearance. A small left pleural effusion is suspected. No pneumothorax is seen.  The cardiomediastinal silhouette is borderline normal in size. No acute osseous abnormalities are seen.  IMPRESSION: Lungs hypoexpanded. Left basilar airspace opacification raises concern for pneumonia. Suspect small left pleural effusion. Vascular congestion noted; asymmetric interstitial edema might have a similar appearance.   Electronically Signed   By: Garald Balding M.D.   On: 09/24/2013 03:24    Medications: . ceFEPime (MAXIPIME) IV  1 g Intravenous Q12H  . docusate sodium  100 mg Oral Daily  . enoxaparin (LOVENOX) injection  40 mg Subcutaneous Q24H  . insulin aspart  0-15 Units Subcutaneous TID WC  . insulin aspart  0-5 Units Subcutaneous QHS  . insulin glargine  25 Units Subcutaneous Daily  . vancomycin  1,000 mg Intravenous Q8H    Assessment/Plan Poorly drained perirectal  abscess; IRRIGATION AND DEBRIDEMENT PERIRECTAL ABSCESS with packing of two bottle of 1/2 inch iodinated ribbon gauze, 09/24/13, Dr. Judeth Horn Right gluteal abscess and labial abscesses  I&D right gluteal abscess 08/18/13 Dr. Ralene Ok  Type I diabetes mellitus, uncontrolled (diagnosed age 4) SCD/Lovenox for DVT  Plan:  I would get her up and walking some.  Dr. Hulen Skains wants to change the dressing in the Am.     LOS: 6 days    [name] 09/25/2013

## 2013-09-25 NOTE — Progress Notes (Signed)
Inpatient Diabetes Program Recommendations  AACE/ADA: New Consensus Statement on Inpatient Glycemic Control (2013)  Target Ranges:  Prepandial:   less than 140 mg/dL      Peak postprandial:   less than 180 mg/dL (1-2 hours)      Critically ill patients:  140 - 180 mg/dL   Reason for Visit: Hyperglycemia  Results for SHAKARRA, DUNKLE (MRN [id number]) as of 09/25/2013 15:51  Ref. Range 09/25/2013 07:45 09/25/2013 12:24  Glucose-Capillary Latest Range: 70-99 mg/dL 312 (H) 231 (H)   Needs meal coverage insulin. Consider addition of Novolog 4 units tidwc for meal coverage insulin. If CBGs consistently >180 mg/dL, need titration of Lantus.  Will continue to follow for needs. Thank you. Lorenda Peck, RD, LDN, CDE Inpatient Diabetes Coordinator [phone]

## 2013-09-26 LAB — CULTURE, BLOOD (ROUTINE X 2)
CULTURE: NO GROWTH
Culture: NO GROWTH

## 2013-09-26 LAB — BASIC METABOLIC PANEL
BUN: 5 mg/dL — AB (ref 6–23)
CHLORIDE: 100 meq/L (ref 96–112)
CO2: 23 mEq/L (ref 19–32)
Calcium: 9.2 mg/dL (ref 8.4–10.5)
Creatinine, Ser: 0.48 mg/dL — ABNORMAL LOW (ref 0.50–1.10)
Glucose, Bld: 288 mg/dL — ABNORMAL HIGH (ref 70–99)
Potassium: 4 mEq/L (ref 3.7–5.3)
Sodium: 135 mEq/L — ABNORMAL LOW (ref 137–147)

## 2013-09-26 LAB — GLUCOSE, CAPILLARY
GLUCOSE-CAPILLARY: 187 mg/dL — AB (ref 70–99)
Glucose-Capillary: 309 mg/dL — ABNORMAL HIGH (ref 70–99)
Glucose-Capillary: 165 mg/dL — ABNORMAL HIGH (ref 70–99)
Glucose-Capillary: 293 mg/dL — ABNORMAL HIGH (ref 70–99)

## 2013-09-26 LAB — CBC WITH DIFFERENTIAL/PLATELET
BASOS PCT: 1 % (ref 0–1)
Basophils Absolute: 0.1 10*3/uL (ref 0.0–0.1)
EOS ABS: 0.4 10*3/uL (ref 0.0–0.7)
Eosinophils Relative: 3 % (ref 0–5)
HEMATOCRIT: 28.9 % — AB (ref 36.0–46.0)
HEMOGLOBIN: 9.2 g/dL — AB (ref 12.0–15.0)
Lymphocytes Relative: 26 % (ref 12–46)
Lymphs Abs: 2.8 10*3/uL (ref 0.7–4.0)
MCH: 25.9 pg — AB (ref 26.0–34.0)
MCHC: 31.8 g/dL (ref 30.0–36.0)
MCV: 81.4 fL (ref 78.0–100.0)
MONO ABS: 1.3 10*3/uL — AB (ref 0.1–1.0)
MONOS PCT: 12 % (ref 3–12)
Neutro Abs: 6.6 10*3/uL (ref 1.7–7.7)
Neutrophils Relative %: 58 % (ref 43–77)
Platelets: 507 10*3/uL — ABNORMAL HIGH (ref 150–400)
RBC: 3.55 MIL/uL — ABNORMAL LOW (ref 3.87–5.11)
RDW: 14.6 % (ref 11.5–15.5)
WBC: 11.1 10*3/uL — ABNORMAL HIGH (ref 4.0–10.5)

## 2013-09-26 MED ORDER — MORPHINE SULFATE 2 MG/ML IJ SOLN
2.0000 mg | Freq: Once | INTRAMUSCULAR | Status: AC
  Administered 2013-09-26: 2 mg via INTRAVENOUS
  Filled 2013-09-26: qty 1

## 2013-09-26 NOTE — Progress Notes (Signed)
2 Days Post-Op  Subjective: We got the packing out with 4 mg of IV Morphine.  We also cleaned the right labial fold with soap and water.  I had them take the foley out.  We talked about keeping the area clean, and keeping her sugars in a good safe range.  I told her to shower and will let her do it after the morphine effects have resolved.  We don't plan to repack, just clean with shower and dry dressing over the open rectal site.  Objective: Vital signs in last 24 hours: Temp:  [98 F (36.7 C)-98.6 F (37 C)] 98 F (36.7 C) (05/30 0535) Pulse Rate:  [86-96] 90 (05/30 0535) Resp:  [18-19] 19 (05/30 0535) BP: (113-126)/(54-85) 126/85 mmHg (05/30 0535) SpO2:  [92 %-96 %] 96 % (05/30 0535) Weight:  [57.56 kg (126 lb 14.4 oz)] 57.56 kg (126 lb 14.4 oz) (05/29 2308) Last BM Date: 09/20/13  Intake/Output from previous day: 05/29 0701 - 05/30 0700 In: 840 [P.O.:840] Out: 4000 [Urine:4000] Intake/Output this shift:    General appearance: alert, cooperative and no distress Skin: We took out 2 bottles of iodoform gauze, and the site looks OK.  Labail fold on right needs to be cleaned but OK.  Lab Results:   Recent Labs  09/25/13 0810 09/26/13 0540  WBC 16.8* 11.1*  HGB 9.3* 9.2*  HCT 29.9* 28.9*  PLT 551* 507*    BMET  Recent Labs  09/24/13 0638 09/26/13 0540  NA 134* 135*  K 4.8 4.0  CL 99 100  CO2 21 23  GLUCOSE 478* 288*  BUN 6 5*  CREATININE 0.42* 0.48*  CALCIUM 8.9 9.2   PT/INR No results found for this basename: LABPROT, INR,  in the last 72 hours   Recent Labs Lab 09/19/13 1830  AST 10  ALT <5  ALKPHOS 138*  BILITOT 0.2*  PROT 6.6  ALBUMIN 2.7*     Lipase     Component Value Date/Time   LIPASE 28 07/05/2013 1218     Studies/Results: No results found.  Medications: . ceFEPime (MAXIPIME) IV  1 g Intravenous Q12H  . docusate sodium  100 mg Oral BID  . enoxaparin (LOVENOX) injection  40 mg Subcutaneous Q24H  . insulin aspart  0-15 Units  Subcutaneous TID WC  . insulin aspart  0-5 Units Subcutaneous QHS  . insulin glargine  30 Units Subcutaneous Daily  .  morphine injection  2 mg Intravenous Once  . vancomycin  1,250 mg Intravenous Q8H    Assessment/Plan Poorly drained perirectal abscess; IRRIGATION AND DEBRIDEMENT PERIRECTAL ABSCESS with packing of two bottle of 1/2 inch iodinated ribbon gauze, 09/24/13, Dr. Judeth Horn  Right gluteal abscess and labial abscesses  I&D right gluteal abscess 08/18/13 Dr. Ralene Ok  Type I diabetes mellitus, uncontrolled (diagnosed age 82)  SCD/Lovenox for DVT    Plan:  We have taken out the iodoform, start showers, dry dressing for the open buttocks wound.  Remove the foley and start to mobilize  Trichomonas on wet prep. No growth on either culture so far. Urine culture >100K  LACTOBACILLUS SPECIES She has had 7 days of Cefepime 8 days of Vancomycin.  Will need to Discuss antibiotics length and  Choice with everyone.    LOS: 7 days    Ashley Freeman 09/26/2013

## 2013-09-26 NOTE — Progress Notes (Signed)
General Surgery [facility]  Patient seen and examined.  Discussed with family at bedside.  [name], MD, [facility] Office: [phone]

## 2013-09-26 NOTE — Progress Notes (Addendum)
Subjective: No acute events overnight.  Pt POD 2 for re debridement of gluteal abscess. Pt having no pain and tolerating regular diet. Pt tired and wanting to sleep and therefore choosing not to be that interactive during interview.   Objective: Vital signs in last 24 hours: Filed Vitals:   09/25/13 1802 09/25/13 2308 09/26/13 0535 09/26/13 1000  BP: 116/54 113/73 126/85 133/87  Pulse: 96 86 90 84  Temp: 98.6 F (37 C) 98.4 F (36.9 C) 98 F (36.7 C) 98.6 F (37 C)  TempSrc: Oral Oral Oral Oral  Resp: 18 18 19 20   Height:  5\' 3"  (1.6 m)    Weight:  126 lb 14.4 oz (57.56 kg)    SpO2:  92% 96%    Weight change:   Intake/Output Summary (Last 24 hours) at 09/26/13 1159 Last data filed at 09/26/13 0900  Gross per 24 hour  Intake    840 ml  Output   5100 ml  Net  -4260 ml   General: in bed in NAD HEENT: no gross abnormality Cardiac: tachycardic, regular rhythm, no rubs, murmurs or gallops Pulm:  CTA B/L, moving normal volumes of air Abd: soft, nontender, nondistended, BS present GU:  Foley in place draining clear urine, wound covered with gauze no visible pus ro drainage Neuro: alert and oriented X3, responding appropriately, able to move all extremities  Lab Results: Basic Metabolic Panel:  Recent Labs Lab 09/19/13 2247 09/20/13 0320  09/24/13 0638 09/26/13 0540  NA 132* 135*  136*  < > 134* 135*  K 3.9 4.0  4.0  < > 4.8 4.0  CL 97 103  102  < > 99 100  CO2 21 20  21   < > 21 23  GLUCOSE 324* 163*  163*  < > 478* 288*  BUN 6 4*  4*  < > 6 5*  CREATININE 0.57 0.50  0.50  < > 0.42* 0.48*  CALCIUM 8.5 8.1*  8.0*  < > 8.9 9.2  MG 1.7  --   --   --   --   PHOS 2.2* 2.3  --   --   --   < > = values in this interval not displayed. Liver Function Tests:  Recent Labs Lab 09/19/13 1830  AST 10  ALT <5  ALKPHOS 138*  BILITOT 0.2*  PROT 6.6  ALBUMIN 2.7*   CBC:  Recent Labs Lab 09/25/13 0810 09/26/13 0540  WBC 16.8* 11.1*  NEUTROABS 11.7* 6.6  HGB  9.3* 9.2*  HCT 29.9* 28.9*  MCV 83.1 81.4  PLT 551* 507*   CBG:  Recent Labs Lab 09/24/13 1955 09/25/13 0745 09/25/13 1224 09/25/13 1652 09/25/13 2323 09/26/13 0802  GLUCAP 174* 312* 231* 330* 220* 309*    Urinalysis:  Recent Labs Lab 09/19/13 1846 09/20/13 1130  COLORURINE STRAW* YELLOW  LABSPEC 1.036* 1.020  PHURINE 6.0 6.0  GLUCOSEU >1000* >1000*  HGBUR LARGE* NEGATIVE  BILIRUBINUR NEGATIVE NEGATIVE  KETONESUR 15* NEGATIVE  PROTEINUR NEGATIVE NEGATIVE  UROBILINOGEN 0.2 0.2  NITRITE NEGATIVE NEGATIVE  LEUKOCYTESUR MODERATE* NEGATIVE    Micro Results: Recent Results (from the past 240 hour(s))  URINE CULTURE     Status: None   Collection Time    09/19/13  6:46 PM      Result Value Ref Range Status   Specimen Description URINE, CLEAN CATCH   Final   Special Requests NONE   Final   Culture  Setup Time     Final   Value:  09/20/2013 03:48     Performed at [facility]     Final   Value: >=100,000 COLONIES/ML     Performed at [facility]   Culture     Final   Value: LACTOBACILLUS SPECIES     Note: Standardized susceptibility testing for this organism is not available.     Performed at [facility]   Report Status 09/21/2013 FINAL   Final  WOUND CULTURE     Status: None   Collection Time    09/19/13  8:50 PM      Result Value Ref Range Status   Specimen Description PERIRECTAL   Final   Special Requests NONE   Final   Gram Stain     Final   Value: RARE WBC PRESENT,BOTH PMN AND MONONUCLEAR     NO SQUAMOUS EPITHELIAL CELLS SEEN     RARE GRAM POSITIVE COCCI     IN CHAINS RARE GRAM NEGATIVE RODS     Performed at [facility]   Culture     Final   Value: Multiple Species Consistent With Normal Fecal Flora     Performed at [facility]   Report Status 09/22/2013 FINAL   Final  MRSA PCR SCREENING     Status: None   Collection Time    09/19/13 10:01 PM      Result Value Ref Range Status   MRSA by PCR  NEGATIVE  NEGATIVE Final   Comment:            The GeneXpert MRSA Assay (FDA     approved for NASAL specimens     only), is one component of a     comprehensive MRSA colonization     surveillance program. It is not     intended to diagnose MRSA     infection nor to guide or     monitor treatment for     MRSA infections.  CULTURE, BLOOD (ROUTINE X 2)     Status: None   Collection Time    09/19/13 10:47 PM      Result Value Ref Range Status   Specimen Description BLOOD RIGHT ANTECUBITAL   Final   Special Requests BOTTLES DRAWN AEROBIC ONLY 5CC   Final   Culture  Setup Time     Final   Value: 09/20/2013 03:14     Performed at [facility]   Culture     Final   Value:        BLOOD CULTURE RECEIVED NO GROWTH TO DATE CULTURE WILL BE HELD FOR 5 DAYS BEFORE ISSUING A FINAL NEGATIVE REPORT     Performed at [facility]   Report Status PENDING   Incomplete  CULTURE, BLOOD (ROUTINE X 2)     Status: None   Collection Time    09/19/13 10:57 PM      Result Value Ref Range Status   Specimen Description BLOOD RIGHT HAND   Final   Special Requests BOTTLES DRAWN AEROBIC ONLY 5CC   Final   Culture  Setup Time     Final   Value: 09/20/2013 03:14     Performed at [facility]   Culture     Final   Value:        BLOOD CULTURE RECEIVED NO GROWTH TO DATE CULTURE WILL BE HELD FOR 5 DAYS BEFORE ISSUING A FINAL NEGATIVE REPORT     Performed at [facility]  Report Status PENDING   Incomplete  ANAEROBIC CULTURE     Status: None   Collection Time    09/20/13  1:22 AM      Result Value Ref Range Status   Specimen Description ABSCESS RIGHT LABIA   Final   Special Requests PATIENT ON FOLLOWING VANCOMYCIN,CEFAPIME   Final   Gram Stain     Final   Value: NO WBC SEEN     NO SQUAMOUS EPITHELIAL CELLS SEEN     RARE GRAM POSITIVE COCCI     IN PAIRS     Performed at [facility]   Culture     Final   Value: NO ANAEROBES ISOLATED     Performed at [facility]   Report Status 09/25/2013 FINAL   Final  CULTURE, ROUTINE-ABSCESS     Status: None   Collection Time    09/20/13  1:22 AM      Result Value Ref Range Status   Specimen Description ABSCESS RIGHT LABIA   Final   Special Requests PATIENT ON FOLLOWING VANCOMYCIN,CEFAPIME   Final   Gram Stain     Final   Value: NO WBC SEEN     NO SQUAMOUS EPITHELIAL CELLS SEEN     RARE GRAM POSITIVE COCCI     IN PAIRS     Performed at [facility]   Culture     Final   Value: RARE CANDIDA ALBICANS     Performed at [facility]   Report Status 09/23/2013 FINAL   Final  CULTURE, ROUTINE-ABSCESS     Status: None   Collection Time    09/24/13  1:44 PM      Result Value Ref Range Status   Specimen Description ABSCESS PERIRECTAL   Final   Special Requests PATIENT ON FOLLOWING VANCOMYCIN   Final   Gram Stain     Final   Value: ABUNDANT WBC PRESENT, PREDOMINANTLY PMN     NO SQUAMOUS EPITHELIAL CELLS SEEN     NO ORGANISMS SEEN     Performed at [facility]   Culture     Final   Value: NO GROWTH 1 DAY     Performed at [facility]   Report Status PENDING   Incomplete  ANAEROBIC CULTURE     Status: None   Collection Time    09/24/13  1:44 PM      Result Value Ref Range Status   Specimen Description ABSCESS PERIRECTAL   Final   Special Requests PATIENT ON FOLLOWING VANCOMYCIN   Final   Gram Stain     Final   Value: ABUNDANT WBC PRESENT, PREDOMINANTLY PMN     NO SQUAMOUS EPITHELIAL CELLS SEEN     NO ORGANISMS SEEN     Performed at [facility]   Culture     Final   Value: NO ANAEROBES ISOLATED; CULTURE IN PROGRESS FOR 5 DAYS     Performed at [facility]   Report Status PENDING   Incomplete   Medications: I have reviewed the patient's current medications. Scheduled Meds: . ceFEPime (MAXIPIME) IV  1 g Intravenous Q12H  . docusate sodium  100 mg Oral BID  . enoxaparin (LOVENOX) injection  40 mg Subcutaneous Q24H  . insulin aspart  0-15 Units  Subcutaneous TID WC  . insulin aspart  0-5 Units Subcutaneous QHS  . insulin glargine  30 Units Subcutaneous Daily  . vancomycin  1,250 mg Intravenous Q8H   Continuous  Infusions: . sodium chloride 1,000 mL (09/25/13 1552)   PRN Meds:.acetaminophen, dextrose, diphenhydrAMINE, morphine injection, ondansetron (ZOFRAN) IV, oxyCODONE-acetaminophen, polyethylene glycol  Assessment/Plan: 23 year old woman with DM type 1 and hx of insulin non-compliance who presents in DKA with labial and gluteal abscesses.  Resolved sepsis 2/2 right labial abscess and persistent gluteal abscess: Surgery consulted and appreciate surgical intervention.  Patient had I&D of both abscesses in OR on 05/24.  She is tachycardic at baseline (looking back at old records).  Urine culture + lactobacillus (likely contaminant).  05/24 wound cultures - rare candida (labia abscess), normal fecal flora (perirectal abscess).  She went back to OR for further debridement on 05/28 and is POD #1.  WBC 16.8>>11.  She is afebrile and getting medication for pain control.  05/28 wound cultures pending - NGTD. - continue vancomycin, cefepime per pharmacy (patient has penicillin allergy); f/u surgery recomendations - morphine IV 2 mg q4h prn  - change dressing tomorrow morning (05/30), per Surgery -sitz bath and d/c foley per surgery  - blood culture pending - NGTD - monitor CBC and for s&s of infection  DKA in setting of uncontrolled DM1, resolved: A1C 19.3%. On admission, glucose 705-->163, AG 19-->12, bicarb 19, + ketonuria. Lactic acid 2.9-->1.6. Leucocytosis to 19, DKA very likely precipitated by infection per above.  Off insulin gtt and transitioned to [facility].  CBGs 70s-300 in the past 24 hours.   - continue Lantus 25 units daily and SSI moderate. - monitor CBG  Diet:  Carb mod VTE ppx: Lovenox Code:  Full  Dispo: Disposition is deferred at this time, awaiting improvement of current medical problems.  Anticipated discharge in  approximately 2-3 day(s).   The patient does have a current PCP Cresenciano Genre, MD) and does need an [facility] hospital follow-up appointment after discharge.  The patient does not know have transportation limitations that hinder transportation to clinic appointments.  .Services Needed at time of discharge: Y = Yes, Blank = No PT:   OT:   RN:   Equipment:   Other:     LOS: 7 days   Clinton Gallant, MD 09/26/2013, 11:59 AM  attending physician note: I examined this patient together with resident physician Iris a deck and I concur with evaluation and management plans outlined above. We greatly appreciate ongoing surgical attention. When packing has been removed. Foley catheter removed. Blood sugars remain above optimal range and ongoing adjustments in insulin are in progress. Murriel Hopper, MD, [place]  Hematology-Oncology/Internal Medicine

## 2013-09-27 LAB — GLUCOSE, CAPILLARY
GLUCOSE-CAPILLARY: 330 mg/dL — AB (ref 70–99)
GLUCOSE-CAPILLARY: 147 mg/dL — AB (ref 70–99)
Glucose-Capillary: 203 mg/dL — ABNORMAL HIGH (ref 70–99)
Glucose-Capillary: 320 mg/dL — ABNORMAL HIGH (ref 70–99)
Glucose-Capillary: 414 mg/dL — ABNORMAL HIGH (ref 70–99)

## 2013-09-27 LAB — CBC WITH DIFFERENTIAL/PLATELET
BASOS ABS: 0 10*3/uL (ref 0.0–0.1)
BASOS PCT: 0 % (ref 0–1)
EOS ABS: 0.3 10*3/uL (ref 0.0–0.7)
EOS PCT: 3 % (ref 0–5)
HCT: 30.1 % — ABNORMAL LOW (ref 36.0–46.0)
Hemoglobin: 9.4 g/dL — ABNORMAL LOW (ref 12.0–15.0)
LYMPHS PCT: 32 % (ref 12–46)
Lymphs Abs: 3.3 10*3/uL (ref 0.7–4.0)
MCH: 25.7 pg — ABNORMAL LOW (ref 26.0–34.0)
MCHC: 31.2 g/dL (ref 30.0–36.0)
MCV: 82.2 fL (ref 78.0–100.0)
Monocytes Absolute: 1.2 10*3/uL — ABNORMAL HIGH (ref 0.1–1.0)
Monocytes Relative: 11 % (ref 3–12)
Neutro Abs: 5.6 10*3/uL (ref 1.7–7.7)
Neutrophils Relative %: 54 % (ref 43–77)
PLATELETS: 513 10*3/uL — AB (ref 150–400)
RBC: 3.66 MIL/uL — ABNORMAL LOW (ref 3.87–5.11)
RDW: 14.9 % (ref 11.5–15.5)
WBC: 10.5 10*3/uL (ref 4.0–10.5)

## 2013-09-27 LAB — CULTURE, ROUTINE-ABSCESS: CULTURE: NO GROWTH

## 2013-09-27 LAB — VANCOMYCIN, TROUGH: VANCOMYCIN TR: 7.1 ug/mL — AB (ref 10.0–20.0)

## 2013-09-27 MED ORDER — POLYETHYLENE GLYCOL 3350 17 G PO PACK
17.0000 g | PACK | Freq: Two times a day (BID) | ORAL | Status: DC
  Administered 2013-09-27: 17 g via ORAL
  Filled 2013-09-27 (×6): qty 1

## 2013-09-27 MED ORDER — VANCOMYCIN HCL IN DEXTROSE 1-5 GM/200ML-% IV SOLN
1000.0000 mg | Freq: Four times a day (QID) | INTRAVENOUS | Status: DC
  Administered 2013-09-27 – 2013-09-28 (×4): 1000 mg via INTRAVENOUS
  Filled 2013-09-27 (×6): qty 200

## 2013-09-27 MED ORDER — BLISTEX MEDICATED EX OINT
TOPICAL_OINTMENT | Freq: Two times a day (BID) | CUTANEOUS | Status: DC
  Administered 2013-09-27 – 2013-09-29 (×5): via TOPICAL
  Filled 2013-09-27: qty 10

## 2013-09-27 MED ORDER — ACETAMINOPHEN 500 MG PO TABS
1000.0000 mg | ORAL_TABLET | Freq: Three times a day (TID) | ORAL | Status: DC
  Administered 2013-09-27 – 2013-09-29 (×7): 1000 mg via ORAL
  Filled 2013-09-27 (×9): qty 2

## 2013-09-27 MED ORDER — OXYCODONE HCL 5 MG PO TABS
5.0000 mg | ORAL_TABLET | ORAL | Status: DC | PRN
  Administered 2013-09-27 – 2013-09-28 (×3): 10 mg via ORAL
  Administered 2013-09-28: 5 mg via ORAL
  Administered 2013-09-29: 10 mg via ORAL
  Filled 2013-09-27: qty 2
  Filled 2013-09-27: qty 1
  Filled 2013-09-27 (×3): qty 2

## 2013-09-27 MED ORDER — MAGIC MOUTHWASH
15.0000 mL | Freq: Four times a day (QID) | ORAL | Status: DC | PRN
  Filled 2013-09-27: qty 15

## 2013-09-27 MED ORDER — ALUM & MAG HYDROXIDE-SIMETH 200-200-20 MG/5ML PO SUSP: 30.0000 mL | Freq: Four times a day (QID) | ORAL | Status: DC | PRN

## 2013-09-27 MED ORDER — LIP MEDEX EX OINT: 1.0000 "application " | TOPICAL_OINTMENT | Freq: Two times a day (BID) | CUTANEOUS | Status: DC

## 2013-09-27 MED ORDER — MAGNESIUM HYDROXIDE 400 MG/5ML PO SUSP: 30.0000 mL | Freq: Two times a day (BID) | ORAL | Status: DC | PRN

## 2013-09-27 MED ORDER — LACTATED RINGERS IV BOLUS (SEPSIS): 1000.0000 mL | Freq: Three times a day (TID) | INTRAVENOUS | Status: AC | PRN

## 2013-09-27 MED ORDER — INSULIN GLARGINE 100 UNIT/ML ~~LOC~~ SOLN
35.0000 [IU] | Freq: Every day | SUBCUTANEOUS | Status: DC
  Administered 2013-09-28 – 2013-09-29 (×2): 35 [IU] via SUBCUTANEOUS
  Filled 2013-09-27 (×3): qty 0.35

## 2013-09-27 NOTE — Progress Notes (Addendum)
Subjective: No acute events overnight.  POD#3 and pain well controlled.  She is sleeping but easily arousable.  She denies pain and says she plans to eat breakfast when it arrives.  Objective: Vital signs in last 24 hours: Filed Vitals:   09/26/13 1000 09/26/13 1749 09/26/13 2150 09/27/13 0415  BP: 133/87 109/63 127/83 126/86  Pulse: 84 87 93 90  Temp: 98.6 F (37 C) 98.7 F (37.1 C) 98.7 F (37.1 C) 97.9 F (36.6 C)  TempSrc: Oral Oral Oral Oral  Resp: 20 20 20 21   Height:   5\' 3"  (1.6 m)   Weight:   58.59 kg (129 lb 2.7 oz)   SpO2:   97% 96%   Weight change: 1.03 kg (2 lb 4.3 oz)  Intake/Output Summary (Last 24 hours) at 09/27/13 0836 Last data filed at 09/27/13 0830  Gross per 24 hour  Intake   2230 ml  Output   3300 ml  Net  -1070 ml   General: sleeping, arousable, in NAD HEENT: no gross abnormality Cardiac: RRR, no rubs, murmurs or gallops Pulm:  CTA B/L, moving normal volumes of air Abd: soft, nontender, nondistended, BS present GU:  New clean ABD has just been placed on rectal wound Neuro: alert and oriented X3, responding appropriately  Lab Results: Basic Metabolic Panel:  Recent Labs Lab 09/24/13 0638 09/26/13 0540  NA 134* 135*  K 4.8 4.0  CL 99 100  CO2 21 23  GLUCOSE 478* 288*  BUN 6 5*  CREATININE 0.42* 0.48*  CALCIUM 8.9 9.2   CBC:  Recent Labs Lab 09/26/13 0540 09/27/13 0458  WBC 11.1* 10.5  NEUTROABS 6.6 5.6  HGB 9.2* 9.4*  HCT 28.9* 30.1*  MCV 81.4 82.2  PLT 507* 513*   CBG:  Recent Labs Lab 09/25/13 1652 09/25/13 2323 09/26/13 0802 09/26/13 1145 09/26/13 1631 09/26/13 2149  GLUCAP 330* 220* 309* 187* 165* 293*    Micro Results: Recent Results (from the past 240 hour(s))  URINE CULTURE     Status: None   Collection Time    09/19/13  6:46 PM      Result Value Ref Range Status   Specimen Description URINE, CLEAN CATCH   Final   Special Requests NONE   Final   Culture  Setup Time     Final   Value: 09/20/2013 03:48      Performed at [facility]     Final   Value: >=100,000 COLONIES/ML     Performed at [facility]   Culture     Final   Value: LACTOBACILLUS SPECIES     Note: Standardized susceptibility testing for this organism is not available.     Performed at [facility]   Report Status 09/21/2013 FINAL   Final  WOUND CULTURE     Status: None   Collection Time    09/19/13  8:50 PM      Result Value Ref Range Status   Specimen Description PERIRECTAL   Final   Special Requests NONE   Final   Gram Stain     Final   Value: RARE WBC PRESENT,BOTH PMN AND MONONUCLEAR     NO SQUAMOUS EPITHELIAL CELLS SEEN     RARE GRAM POSITIVE COCCI     IN CHAINS RARE GRAM NEGATIVE RODS     Performed at [facility]   Culture     Final   Value: Multiple Species Consistent With Normal Fecal Flora  Performed at [facility]   Report Status 09/22/2013 FINAL   Final  MRSA PCR SCREENING     Status: None   Collection Time    09/19/13 10:01 PM      Result Value Ref Range Status   MRSA by PCR NEGATIVE  NEGATIVE Final   Comment:            The GeneXpert MRSA Assay (FDA     approved for NASAL specimens     only), is one component of a     comprehensive MRSA colonization     surveillance program. It is not     intended to diagnose MRSA     infection nor to guide or     monitor treatment for     MRSA infections.  CULTURE, BLOOD (ROUTINE X 2)     Status: None   Collection Time    09/19/13 10:47 PM      Result Value Ref Range Status   Specimen Description BLOOD RIGHT ANTECUBITAL   Final   Special Requests BOTTLES DRAWN AEROBIC ONLY 5CC   Final   Culture  Setup Time     Final   Value: 09/20/2013 03:14     Performed at [facility]   Culture     Final   Value: NO GROWTH 5 DAYS     Performed at [facility]   Report Status 09/26/2013 FINAL   Final  CULTURE, BLOOD (ROUTINE X 2)     Status: None   Collection Time    09/19/13 10:57 PM       Result Value Ref Range Status   Specimen Description BLOOD RIGHT HAND   Final   Special Requests BOTTLES DRAWN AEROBIC ONLY 5CC   Final   Culture  Setup Time     Final   Value: 09/20/2013 03:14     Performed at [facility]   Culture     Final   Value: NO GROWTH 5 DAYS     Performed at [facility]   Report Status 09/26/2013 FINAL   Final  ANAEROBIC CULTURE     Status: None   Collection Time    09/20/13  1:22 AM      Result Value Ref Range Status   Specimen Description ABSCESS RIGHT LABIA   Final   Special Requests PATIENT ON FOLLOWING VANCOMYCIN,CEFAPIME   Final   Gram Stain     Final   Value: NO WBC SEEN     NO SQUAMOUS EPITHELIAL CELLS SEEN     RARE GRAM POSITIVE COCCI     IN PAIRS     Performed at [facility]   Culture     Final   Value: NO ANAEROBES ISOLATED     Performed at [facility]   Report Status 09/25/2013 FINAL   Final  CULTURE, ROUTINE-ABSCESS     Status: None   Collection Time    09/20/13  1:22 AM      Result Value Ref Range Status   Specimen Description ABSCESS RIGHT LABIA   Final   Special Requests PATIENT ON FOLLOWING VANCOMYCIN,CEFAPIME   Final   Gram Stain     Final   Value: NO WBC SEEN     NO SQUAMOUS EPITHELIAL CELLS SEEN     RARE GRAM POSITIVE COCCI     IN PAIRS     Performed at [facility]   Culture     Final   Value:  RARE CANDIDA ALBICANS     Performed at [facility]   Report Status 09/23/2013 FINAL   Final  CULTURE, ROUTINE-ABSCESS     Status: None   Collection Time    09/24/13  1:44 PM      Result Value Ref Range Status   Specimen Description ABSCESS PERIRECTAL   Final   Special Requests PATIENT ON FOLLOWING VANCOMYCIN   Final   Gram Stain     Final   Value: ABUNDANT WBC PRESENT, PREDOMINANTLY PMN     NO SQUAMOUS EPITHELIAL CELLS SEEN     NO ORGANISMS SEEN     Performed at [facility]   Culture     Final   Value: NO GROWTH 3 DAYS     Performed at [facility]    Report Status 09/27/2013 FINAL   Final  ANAEROBIC CULTURE     Status: None   Collection Time    09/24/13  1:44 PM      Result Value Ref Range Status   Specimen Description ABSCESS PERIRECTAL   Final   Special Requests PATIENT ON FOLLOWING VANCOMYCIN   Final   Gram Stain     Final   Value: ABUNDANT WBC PRESENT, PREDOMINANTLY PMN     NO SQUAMOUS EPITHELIAL CELLS SEEN     NO ORGANISMS SEEN     Performed at [facility]   Culture     Final   Value: NO ANAEROBES ISOLATED; CULTURE IN PROGRESS FOR 5 DAYS     Performed at [facility]   Report Status PENDING   Incomplete   Medications: I have reviewed the patient's current medications. Scheduled Meds: . ceFEPime (MAXIPIME) IV  1 g Intravenous Q12H  . docusate sodium  100 mg Oral BID  . enoxaparin (LOVENOX) injection  40 mg Subcutaneous Q24H  . insulin aspart  0-15 Units Subcutaneous TID WC  . insulin aspart  0-5 Units Subcutaneous QHS  . insulin glargine  30 Units Subcutaneous Daily  . vancomycin  1,250 mg Intravenous Q8H   Continuous Infusions: . sodium chloride 1,000 mL (09/25/13 1552)   PRN Meds:.acetaminophen, dextrose, diphenhydrAMINE, morphine injection, ondansetron (ZOFRAN) IV, oxyCODONE-acetaminophen, polyethylene glycol  Assessment/Plan: 23 year old woman with DM type 1 and hx of insulin non-compliance who presents in DKA with labial and gluteal abscesses.  Resolved sepsis 2/2 right labial abscess and persistent gluteal abscess: Surgery consulted and appreciate surgical intervention.  Patient had I&D of both abscesses in OR on 05/24.  She is tachycardic at baseline (looking back at old records).  Urine culture + lactobacillus (likely contaminant).  05/24 wound cultures - rare candida (labia abscess), normal fecal flora (perirectal abscess).  She went back to OR for further debridement on 05/28 and is POD #3.  WBC 16.8>>10.5.  She is afebrile and getting medication for pain control.  05/28 wound cultures - NGTD. -  continue vancomycin and cefepime (patient has penicillin allergy); f/u surgery recomendations - continue morphine IV 2 mg q4h prn  - continue dry dressing changes, change TID and prn - blood culture pending - NGTD - monitor CBC and for s&s of infection  DKA in setting of uncontrolled DM1, resolved: A1C 19.3%. On admission, glucose 705-->163, AG 19-->12, bicarb 19, + ketonuria. Lactic acid 2.9-->1.6. Leucocytosis to 19, DKA very likely precipitated by infection per above.  Off insulin gtt and transitioned to [facility].  CBGs slightly improved in past 24 hours. - continue Lantus 30 units daily and SSI moderate -  monitor CBG; increase basal as needed for tighter control and improved wound healing potential  Diet:  Carb mod VTE ppx: Lovenox Code:  Full  Dispo: Disposition is deferred at this time, awaiting improvement of current medical problems.  Anticipated discharge in approximately 2-3 day(s).   The patient does have a current PCP Cresenciano Genre, MD) and does need an [facility] hospital follow-up appointment after discharge.  The patient does not know have transportation limitations that hinder transportation to clinic appointments.  .Services Needed at time of discharge: Y = Yes, Blank = No PT:   OT:   RN:   Equipment:   Other:     LOS: 8 days   Duwaine Maxin, DO 09/27/2013, 8:36 AM  Attending physician note: Patient examined. Status and management plan accurate as recorded above by resident physician Dr. Duwaine Maxin Blood sugars remain poorly controlled and additional adjustments in insulin are in progress.. I discussed her situation with general surgery, Dr. Johney Maine, although an antibiotic with better anaerobic coverage might be optimal, she is making progress and her white blood count is steadily falling. She has now received 8 days of parenteral antibiotics. We might be able to transition to an oral agent soon.  Murriel Hopper, MD, [place]  Hematology-Oncology/Internal Medicine

## 2013-09-27 NOTE — Progress Notes (Signed)
[place], [place], [address] [place], [place] [address] Phone: [phone] FAX: Marvin [phone] 03-22-1991  CARE TEAM:  PCP: Cresenciano Genre, MD  Outpatient Care Team: Patient Care Team: Cresenciano Genre, MD as PCP - General (Internal Medicine)  Inpatient Treatment Team: Treatment Team: Attending Provider: Annia Belt, MD; Rounding Team: Imts - Orene Desanctis (Rounding), MD; Consulting Physician: Md Edison Pace, MD; Registered Nurse: Manya Silvas, RN; Respiratory Therapist: Gonzella Lex, RRT   Subjective:  Sore Lying in bed Minimally interactive  Objective:  Vital signs:  Filed Vitals:   09/26/13 1749 09/26/13 2150 09/27/13 0415 09/27/13 0912  BP: 109/63 127/83 126/86 118/75  Pulse: 87 93 90 90  Temp: 98.7 F (37.1 C) 98.7 F (37.1 C) 97.9 F (36.6 C) 98.4 F (36.9 C)  TempSrc: Oral Oral Oral Oral  Resp: '20 20 21 20  ' Height:  '5\' 3"'  (1.6 m)    Weight:  129 lb 2.7 oz (58.59 kg)    SpO2:  97% 96% 97%    Last BM Date: 09/20/13  Intake/Output   Yesterday:  05/30 0701 - 05/31 0700 In: 5462 [P.O.:840; I.V.:900] Out: 3300 [Urine:3300] This shift:  Total I/O In: 490 [P.O.:240; IV Piggyback:250] Out: -   Bowel function:  Flatus: y  BM: ?  Drain: n/a  Physical Exam:  General: Pt awakens groggy at first but eventually alert & oriented x4 in no acute distress Eyes: PERRL, normal EOM.  Sclera clear.  No icterus Neuro: CN II-XII intact w/o focal sensory/motor deficits. Lymph: No head/neck/groin lymphadenopathy Psych:  No delerium/psychosis/paranoia.  Seems quite depressed HENT: Normocephalic, Mucus membranes moist.  No thrush Neck: Supple, No tracheal deviation Chest: No chest wall pain w good excursion CV:  Pulses intact.  Regular rhythm MS: Normal AROM mjr joints.  No obvious deformity Abdomen: Soft.  Nondistended.  No evidence of peritonitis.  No  incarcerated hernias.  GU:  Moderate induration bilateral buttocks with open wound.  No purulence  Ext:  SCDs BLE.  No mjr edema.  No cyanosis Skin: No petechiae / purpura   Problem List:   Principal Problem:   Bartholin's gland abscess Active Problems:   Diabetes mellitus type 1 (uncontrolled)   Hyperglycemia   H/O medication noncompliance   Abscess, gluteal, right   DKA (diabetic ketoacidoses)   Infected cyst of Bartholin's gland duct   Sepsis   Assessment  Ashley Freeman  23 y.o. female  3 Days Post-Op  Procedure(s): IRRIGATION AND DEBRIDEMENT PERIRECTAL ABSCESS 5/24 & 5/28  Significant induration but WBC improving a hopeful sign  Plan:  -continue ABx.  Usually Zosyn for DM soft tissue infections.  GNR on initial GS.  Awaiting Cx's  -aggressive DM control - infection will not improve until glc <180.  Big challenge in this pt  -if induration worsening may need to resume packing - hold off w lower WBC & less pain but follow closely  -VTE prophylaxis- SCDs, etc -mobilize as tolerated to help recovery  Adin Hector, M.D., F.A.C.S. Gastrointestinal and Minimally Invasive Surgery [facility] [address] [place], [place] [address] [phone] Main / Paging   09/27/2013   Results:   Labs: Results for orders placed during the hospital encounter of 09/19/13 (from the past 48 hour(s))  GLUCOSE, CAPILLARY     Status: Abnormal   Collection Time    09/25/13 12:24  PM      Result Value Ref Range   Glucose-Capillary 231 (*) 70 - 99 mg/dL   Comment 1 Notify RN     Comment 2 Documented in Chart    VANCOMYCIN, TROUGH     Status: None   Collection Time    09/25/13  3:30 PM      Result Value Ref Range   Vancomycin Tr 10.7  10.0 - 20.0 ug/mL  GLUCOSE, CAPILLARY     Status: Abnormal   Collection Time    09/25/13  4:52 PM      Result Value Ref Range   Glucose-Capillary 330 (*) 70 - 99 mg/dL   Comment 1 Notify RN      Comment 2 Documented in Chart    GLUCOSE, CAPILLARY     Status: Abnormal   Collection Time    09/25/13 11:23 PM      Result Value Ref Range   Glucose-Capillary 220 (*) 70 - 99 mg/dL  CBC WITH DIFFERENTIAL     Status: Abnormal   Collection Time    09/26/13  5:40 AM      Result Value Ref Range   WBC 11.1 (*) 4.0 - 10.5 K/uL   RBC 3.55 (*) 3.87 - 5.11 MIL/uL   Hemoglobin 9.2 (*) 12.0 - 15.0 g/dL   HCT 28.9 (*) 36.0 - 46.0 %   MCV 81.4  78.0 - 100.0 fL   MCH 25.9 (*) 26.0 - 34.0 pg   MCHC 31.8  30.0 - 36.0 g/dL   RDW 14.6  11.5 - 15.5 %   Platelets 507 (*) 150 - 400 K/uL   Neutrophils Relative % 58  43 - 77 %   Neutro Abs 6.6  1.7 - 7.7 K/uL   Lymphocytes Relative 26  12 - 46 %   Lymphs Abs 2.8  0.7 - 4.0 K/uL   Monocytes Relative 12  3 - 12 %   Monocytes Absolute 1.3 (*) 0.1 - 1.0 K/uL   Eosinophils Relative 3  0 - 5 %   Eosinophils Absolute 0.4  0.0 - 0.7 K/uL   Basophils Relative 1  0 - 1 %   Basophils Absolute 0.1  0.0 - 0.1 K/uL  BASIC METABOLIC PANEL     Status: Abnormal   Collection Time    09/26/13  5:40 AM      Result Value Ref Range   Sodium 135 (*) 137 - 147 mEq/L   Potassium 4.0  3.7 - 5.3 mEq/L   Chloride 100  96 - 112 mEq/L   CO2 23  19 - 32 mEq/L   Glucose, Bld 288 (*) 70 - 99 mg/dL   BUN 5 (*) 6 - 23 mg/dL   Creatinine, Ser 0.48 (*) 0.50 - 1.10 mg/dL   Calcium 9.2  8.4 - 10.5 mg/dL   GFR calc non Af Amer >90  >90 mL/min   GFR calc Af Amer >90  >90 mL/min   Comment: (NOTE)     The eGFR has been calculated using the CKD EPI equation.     This calculation has not been validated in all clinical situations.     eGFR's persistently <90 mL/min signify possible Chronic Kidney     Disease.  GLUCOSE, CAPILLARY     Status: Abnormal   Collection Time    09/26/13  8:02 AM      Result Value Ref Range   Glucose-Capillary 309 (*) 70 - 99 mg/dL   Comment 1 Notify RN  Comment 2 Documented in Chart    GLUCOSE, CAPILLARY     Status: Abnormal   Collection Time     09/26/13 11:45 AM      Result Value Ref Range   Glucose-Capillary 187 (*) 70 - 99 mg/dL   Comment 1 Notify RN     Comment 2 Documented in Chart    GLUCOSE, CAPILLARY     Status: Abnormal   Collection Time    09/26/13  4:31 PM      Result Value Ref Range   Glucose-Capillary 165 (*) 70 - 99 mg/dL   Comment 1 Notify RN     Comment 2 Documented in Chart    GLUCOSE, CAPILLARY     Status: Abnormal   Collection Time    09/26/13  9:49 PM      Result Value Ref Range   Glucose-Capillary 293 (*) 70 - 99 mg/dL  CBC WITH DIFFERENTIAL     Status: Abnormal   Collection Time    09/27/13  4:58 AM      Result Value Ref Range   WBC 10.5  4.0 - 10.5 K/uL   RBC 3.66 (*) 3.87 - 5.11 MIL/uL   Hemoglobin 9.4 (*) 12.0 - 15.0 g/dL   HCT 30.1 (*) 36.0 - 46.0 %   MCV 82.2  78.0 - 100.0 fL   MCH 25.7 (*) 26.0 - 34.0 pg   MCHC 31.2  30.0 - 36.0 g/dL   RDW 14.9  11.5 - 15.5 %   Platelets 513 (*) 150 - 400 K/uL   Neutrophils Relative % 54  43 - 77 %   Neutro Abs 5.6  1.7 - 7.7 K/uL   Lymphocytes Relative 32  12 - 46 %   Lymphs Abs 3.3  0.7 - 4.0 K/uL   Monocytes Relative 11  3 - 12 %   Monocytes Absolute 1.2 (*) 0.1 - 1.0 K/uL   Eosinophils Relative 3  0 - 5 %   Eosinophils Absolute 0.3  0.0 - 0.7 K/uL   Basophils Relative 0  0 - 1 %   Basophils Absolute 0.0  0.0 - 0.1 K/uL  VANCOMYCIN, TROUGH     Status: Abnormal   Collection Time    09/27/13  8:25 AM      Result Value Ref Range   Vancomycin Tr 7.1 (*) 10.0 - 20.0 ug/mL    Imaging / Studies: No results found.  Medications / Allergies: per chart  Antibiotics: Anti-infectives   Start     Dose/Rate Route Frequency Ordered Stop   09/26/13 0030  vancomycin (VANCOCIN) 1,250 mg in sodium chloride 0.9 % 250 mL IVPB     1,250 mg 166.7 mL/hr over 90 Minutes Intravenous Every 8 hours 09/25/13 1718     09/24/13 1630  vancomycin (VANCOCIN) IVPB 1000 mg/200 mL premix  Status:  Discontinued     1,000 mg 200 mL/hr over 60 Minutes Intravenous Every 8  hours 09/24/13 1031 09/25/13 1718   09/22/13 2100  vancomycin (VANCOCIN) IVPB 1000 mg/200 mL premix  Status:  Discontinued     1,000 mg 200 mL/hr over 60 Minutes Intravenous Every 6 hours 09/22/13 1616 09/24/13 1031   09/21/13 2200  vancomycin (VANCOCIN) IVPB 750 mg/150 ml premix  Status:  Discontinued     750 mg 150 mL/hr over 60 Minutes Intravenous Every 8 hours 09/21/13 1553 09/22/13 1616   09/21/13 1600  vancomycin (VANCOCIN) IVPB 750 mg/150 ml premix  Status:  Discontinued     750 mg  150 mL/hr over 60 Minutes Intravenous Every 8 hours 09/21/13 1548 09/21/13 1553   09/21/13 1600  vancomycin (VANCOCIN) IVPB 1000 mg/200 mL premix     1,000 mg 200 mL/hr over 60 Minutes Intravenous  Once 09/21/13 1553 09/21/13 1700   09/19/13 2230  ceFEPIme (MAXIPIME) 1 g in dextrose 5 % 50 mL IVPB     1 g 100 mL/hr over 30 Minutes Intravenous Every 12 hours 09/19/13 2225     09/19/13 2230  vancomycin (VANCOCIN) 500 mg in sodium chloride 0.9 % 100 mL IVPB  Status:  Discontinued     500 mg 100 mL/hr over 60 Minutes Intravenous Every 8 hours 09/19/13 2226 09/21/13 1548   09/19/13 1800  clindamycin (CLEOCIN) IVPB 600 mg     600 mg 100 mL/hr over 30 Minutes Intravenous  Once 09/19/13 1755 09/19/13 2054   09/19/13 0000  clindamycin (CLEOCIN) 150 MG capsule     150 mg Oral Every 6 hours 09/19/13 1558         Note: This dictation was prepared with Dragon/digital dictation along with [employer]. Any transcriptional errors that result from this process are unintentional.

## 2013-09-27 NOTE — Progress Notes (Signed)
ANTIBIOTIC CONSULT NOTE - Follow Up  Pharmacy Consult for vancomycin and cefepime Indication: rule out sepsis/labial and gluteal abscess  Allergies  Allergen Reactions  . Penicillins Hives    Patient Measurements: Height: 5\' 3"  (160 cm) Weight: 129 lb 2.7 oz (58.59 kg) IBW/kg (Calculated) : 52.4   Vital Signs: Temp: 98.4 F (36.9 C) (05/31 0912) Temp src: Oral (05/31 0912) BP: 118/75 mmHg (05/31 0912) Pulse Rate: 90 (05/31 0912) Intake/Output from previous day: 05/30 0701 - 05/31 0700 In: 1740 [P.O.:840; I.V.:900] Out: 3300 [Urine:3300] Intake/Output from this shift: Total I/O In: 780 [P.O.:480; IV Piggyback:300] Out: -   Labs:  Recent Labs  09/25/13 0810 09/26/13 0540 09/27/13 0458  WBC 16.8* 11.1* 10.5  HGB 9.3* 9.2* 9.4*  PLT 551* 507* 513*  CREATININE  --  0.48*  --    Estimated Creatinine Clearance: 92 ml/min (by C-G formula based on Cr of 0.48).  Recent Labs  09/25/13 1530 09/27/13 0825  VANCOTROUGH 10.7 7.1*     Microbiology: Recent Results (from the past 720 hour(s))  WET PREP, GENITAL     Status: Abnormal   Collection Time    09/15/13  3:39 PM      Result Value Ref Range Status   Yeast Wet Prep HPF POC NONE SEEN  NONE SEEN Final   Trich, Wet Prep MODERATE (*) NONE SEEN Final   Clue Cells Wet Prep HPF POC FEW (*) NONE SEEN Final   WBC, Wet Prep HPF POC MODERATE (*) NONE SEEN Final  GC/CHLAMYDIA PROBE AMP     Status: None   Collection Time    09/15/13  3:39 PM      Result Value Ref Range Status   CT Probe RNA NEGATIVE  NEGATIVE Final   GC Probe RNA NEGATIVE  NEGATIVE Final   Comment: (NOTE)                                                                                               **Normal Reference Range: Negative**          Assay performed using the Gen-Probe APTIMA COMBO2 (R) Assay.     Acceptable specimen types for this assay include APTIMA Swabs (Unisex,     endocervical, urethral, or vaginal), first void urine, and ThinPrep      liquid based cytology samples.     Performed at [facility]     Status: None   Collection Time    09/19/13  6:46 PM      Result Value Ref Range Status   Specimen Description URINE, CLEAN CATCH   Final   Special Requests NONE   Final   Culture  Setup Time     Final   Value: 09/20/2013 03:48     Performed at [facility]     Final   Value: >=100,000 COLONIES/ML     Performed at [facility]   Culture     Final   Value: LACTOBACILLUS SPECIES     Note: Standardized susceptibility testing for this organism is not available.  Performed at [facility]   Report Status 09/21/2013 FINAL   Final  WOUND CULTURE     Status: None   Collection Time    09/19/13  8:50 PM      Result Value Ref Range Status   Specimen Description PERIRECTAL   Final   Special Requests NONE   Final   Gram Stain     Final   Value: RARE WBC PRESENT,BOTH PMN AND MONONUCLEAR     NO SQUAMOUS EPITHELIAL CELLS SEEN     RARE GRAM POSITIVE COCCI     IN CHAINS RARE GRAM NEGATIVE RODS     Performed at [facility]   Culture     Final   Value: Multiple Species Consistent With Normal Fecal Flora     Performed at [facility]   Report Status 09/22/2013 FINAL   Final  MRSA PCR SCREENING     Status: None   Collection Time    09/19/13 10:01 PM      Result Value Ref Range Status   MRSA by PCR NEGATIVE  NEGATIVE Final   Comment:            The GeneXpert MRSA Assay (FDA     approved for NASAL specimens     only), is one component of a     comprehensive MRSA colonization     surveillance program. It is not     intended to diagnose MRSA     infection nor to guide or     monitor treatment for     MRSA infections.  CULTURE, BLOOD (ROUTINE X 2)     Status: None   Collection Time    09/19/13 10:47 PM      Result Value Ref Range Status   Specimen Description BLOOD RIGHT ANTECUBITAL   Final   Special Requests BOTTLES DRAWN AEROBIC ONLY 5CC    Final   Culture  Setup Time     Final   Value: 09/20/2013 03:14     Performed at [facility]   Culture     Final   Value: NO GROWTH 5 DAYS     Performed at [facility]   Report Status 09/26/2013 FINAL   Final  CULTURE, BLOOD (ROUTINE X 2)     Status: None   Collection Time    09/19/13 10:57 PM      Result Value Ref Range Status   Specimen Description BLOOD RIGHT HAND   Final   Special Requests BOTTLES DRAWN AEROBIC ONLY 5CC   Final   Culture  Setup Time     Final   Value: 09/20/2013 03:14     Performed at [facility]   Culture     Final   Value: NO GROWTH 5 DAYS     Performed at [facility]   Report Status 09/26/2013 FINAL   Final  ANAEROBIC CULTURE     Status: None   Collection Time    09/20/13  1:22 AM      Result Value Ref Range Status   Specimen Description ABSCESS RIGHT LABIA   Final   Special Requests PATIENT ON FOLLOWING VANCOMYCIN,CEFAPIME   Final   Gram Stain     Final   Value: NO WBC SEEN     NO SQUAMOUS EPITHELIAL CELLS SEEN     RARE GRAM POSITIVE COCCI     IN PAIRS     Performed at [facility]  Final   Value: NO ANAEROBES ISOLATED     Performed at [facility]   Report Status 09/25/2013 FINAL   Final  CULTURE, ROUTINE-ABSCESS     Status: None   Collection Time    09/20/13  1:22 AM      Result Value Ref Range Status   Specimen Description ABSCESS RIGHT LABIA   Final   Special Requests PATIENT ON FOLLOWING VANCOMYCIN,CEFAPIME   Final   Gram Stain     Final   Value: NO WBC SEEN     NO SQUAMOUS EPITHELIAL CELLS SEEN     RARE GRAM POSITIVE COCCI     IN PAIRS     Performed at [facility]   Culture     Final   Value: RARE CANDIDA ALBICANS     Performed at [facility]   Report Status 09/23/2013 FINAL   Final  CULTURE, ROUTINE-ABSCESS     Status: None   Collection Time    09/24/13  1:44 PM      Result Value Ref Range Status   Specimen Description ABSCESS PERIRECTAL   Final    Special Requests PATIENT ON FOLLOWING VANCOMYCIN   Final   Gram Stain     Final   Value: ABUNDANT WBC PRESENT, PREDOMINANTLY PMN     NO SQUAMOUS EPITHELIAL CELLS SEEN     NO ORGANISMS SEEN     Performed at [facility]   Culture     Final   Value: NO GROWTH 3 DAYS     Performed at [facility]   Report Status 09/27/2013 FINAL   Final  ANAEROBIC CULTURE     Status: None   Collection Time    09/24/13  1:44 PM      Result Value Ref Range Status   Specimen Description ABSCESS PERIRECTAL   Final   Special Requests PATIENT ON FOLLOWING VANCOMYCIN   Final   Gram Stain     Final   Value: ABUNDANT WBC PRESENT, PREDOMINANTLY PMN     NO SQUAMOUS EPITHELIAL CELLS SEEN     NO ORGANISMS SEEN     Performed at [facility]   Culture     Final   Value: NO ANAEROBES ISOLATED; CULTURE IN PROGRESS FOR 5 DAYS     Performed at [facility]   Report Status PENDING   Incomplete    Medical History: Past Medical History  Diagnosis Date  . Gonorrhea 08/2011    Treated in 09/2011  . Diabetes mellitus 2001    Diagnosed at age 23 ; Type I  . DKA (diabetic ketoacidoses) 08/19/2013    Medications:  Prescriptions prior to admission  Medication Sig Dispense Refill  . insulin glargine (LANTUS) 100 unit/mL SOPN Inject 0.2 mLs (20 Units total) into the skin every morning.  15 mL  11  . insulin lispro (HUMALOG KWIKPEN) 100 UNIT/ML KiwkPen Inject 0.03 mLs (3 Units total) into the skin 3 (three) times daily.  15 mL  11   Assessment: 23 year old female who continues on vancomycin and cefepime for sepsis likely due to bartholin gland abscess/perirectal abscess s/p I&D on 5/23 and I&D of gluteal abscess on 5/28. WBC trending down again and patient remains afebrile.  Renal function remains stable.   Vancomycin trough this morning, drawn appropriately per nurse, remains SUBtherapeutic at 7.1 after dose increase.  Patient previously slightly supratherapeutic (21.3) on vanc 1gm q6hr,  but dose just prior to level was given late (  level may have been appropriate?).  Regardless, at this time, a slightly supratherapeutic trough is more appropriate than under-dosing the patient.  Will redose at 1 gram Q6hr and check a level after 6 doses to ensure at steady state.    Goal of Therapy:  Vancomycin trough level 15-20 mcg/ml  Plan:  - Increase vancomycin to 1000 mg IV q6h - Check VT Tuesday - Continue cefepime 1 gm IV q12h - F/u culture, sensitivities - Follow WBC, fever curve, clinical progression and LOT    Thank you, Vivia Ewing, PharmD Clinical Pharmacist - Resident Pager: [phone] Pharmacy: [phone] 09/27/2013 11:58 AM

## 2013-09-28 ENCOUNTER — Encounter (HOSPITAL_COMMUNITY): Payer: Self-pay | Admitting: General Surgery

## 2013-09-28 LAB — CBC WITH DIFFERENTIAL/PLATELET
Basophils Absolute: 0 K/uL (ref 0.0–0.1)
Basophils Relative: 0 % (ref 0–1)
Eosinophils Absolute: 0.4 K/uL (ref 0.0–0.7)
Eosinophils Relative: 4 % (ref 0–5)
HCT: 26.8 % — ABNORMAL LOW (ref 36.0–46.0)
Hemoglobin: 8.4 g/dL — ABNORMAL LOW (ref 12.0–15.0)
Lymphocytes Relative: 30 % (ref 12–46)
Lymphs Abs: 2.7 K/uL (ref 0.7–4.0)
MCH: 25.8 pg — ABNORMAL LOW (ref 26.0–34.0)
MCHC: 31.3 g/dL (ref 30.0–36.0)
MCV: 82.5 fL (ref 78.0–100.0)
Monocytes Absolute: 0.8 K/uL (ref 0.1–1.0)
Monocytes Relative: 9 % (ref 3–12)
Neutro Abs: 5.1 K/uL (ref 1.7–7.7)
Neutrophils Relative %: 57 % (ref 43–77)
Platelets: 498 K/uL — ABNORMAL HIGH (ref 150–400)
RBC: 3.25 MIL/uL — ABNORMAL LOW (ref 3.87–5.11)
RDW: 14.9 % (ref 11.5–15.5)
WBC: 9 K/uL (ref 4.0–10.5)

## 2013-09-28 LAB — BASIC METABOLIC PANEL
BUN: 7 mg/dL (ref 6–23)
CO2: 23 mEq/L (ref 19–32)
CREATININE: 0.41 mg/dL — AB (ref 0.50–1.10)
Calcium: 9.2 mg/dL (ref 8.4–10.5)
Chloride: 101 mEq/L (ref 96–112)
GLUCOSE: 265 mg/dL — AB (ref 70–99)
Potassium: 4 mEq/L (ref 3.7–5.3)
Sodium: 137 mEq/L (ref 137–147)

## 2013-09-28 LAB — GLUCOSE, CAPILLARY
GLUCOSE-CAPILLARY: 238 mg/dL — AB (ref 70–99)
GLUCOSE-CAPILLARY: 166 mg/dL — AB (ref 70–99)
Glucose-Capillary: 298 mg/dL — ABNORMAL HIGH (ref 70–99)
Glucose-Capillary: 281 mg/dL — ABNORMAL HIGH (ref 70–99)

## 2013-09-28 MED ORDER — INSULIN ASPART 100 UNIT/ML ~~LOC~~ SOLN
4.0000 [IU] | Freq: Three times a day (TID) | SUBCUTANEOUS | Status: DC
  Administered 2013-09-28 – 2013-09-29 (×2): 4 [IU] via SUBCUTANEOUS

## 2013-09-28 MED ORDER — DOXYCYCLINE HYCLATE 100 MG PO TABS
100.0000 mg | ORAL_TABLET | Freq: Two times a day (BID) | ORAL | Status: DC
  Filled 2013-09-28 (×2): qty 1

## 2013-09-28 MED ORDER — SULFAMETHOXAZOLE-TMP DS 800-160 MG PO TABS
1.0000 | ORAL_TABLET | Freq: Two times a day (BID) | ORAL | Status: DC
  Administered 2013-09-28 – 2013-09-29 (×3): 1 via ORAL
  Filled 2013-09-28 (×4): qty 1

## 2013-09-28 MED ORDER — SENNOSIDES-DOCUSATE SODIUM 8.6-50 MG PO TABS: 1.0000 | ORAL_TABLET | Freq: Once | ORAL | Status: DC

## 2013-09-28 MED ORDER — LEVOFLOXACIN 750 MG PO TABS
750.0000 mg | ORAL_TABLET | Freq: Every day | ORAL | Status: DC
  Filled 2013-09-28: qty 1

## 2013-09-28 NOTE — Progress Notes (Signed)
I have seen and examined the patient and agree with the assessment and plans.  Ashley Freeman A. Ashley Henshaw  MD, FACS  

## 2013-09-28 NOTE — Progress Notes (Addendum)
Subjective: Patient without specific complaints today other than that she wants to go home. Patient notes the labial abscess has significantly improved. She has some residual pain to her R buttock abscess. No chills, N/V. She had a bowel movement yesterday per the nurse.  Objective: Vital signs in last 24 hours: Filed Vitals:   09/27/13 1726 09/27/13 2213 09/28/13 0504 09/28/13 1016  BP: 122/64 131/86 120/75 129/85  Pulse: 86 86 90   Temp: 98.1 F (36.7 C) 98.6 F (37 C) 97.5 F (36.4 C) 98.2 F (36.8 C)  TempSrc: Oral Oral Oral Oral  Resp: 20 20 18 18   Height:  5\' 3"  (1.6 m)    Weight:  124 lb 6.4 oz (56.427 kg)    SpO2: 98% 96% 100% 100%   Weight change: -4 lb 12.3 oz (-2.163 kg)  Intake/Output Summary (Last 24 hours) at 09/28/13 1220 Last data filed at 09/27/13 1900  Gross per 24 hour  Intake    120 ml  Output   1000 ml  Net   -880 ml   Physical Exam: General: lying on her side in bed, mostly cooperative with exam HEENT: [place]/AT, MMM Cardiac: RRR, no rubs, murmurs or gallops Pulm:  CTAB, normal WOB Abd: soft, nontender, nondistended, BS normoactive GU:  Some induration surrounding the open area on R buttock, no redness, minimal yellow/clear drainage Neuro: alert and oriented X3, responding appropriately  Lab Results: Basic Metabolic Panel:  Recent Labs Lab 09/26/13 0540 09/28/13 0620  NA 135* 137  K 4.0 4.0  CL 100 101  CO2 23 23  GLUCOSE 288* 265*  BUN 5* 7  CREATININE 0.48* 0.41*  CALCIUM 9.2 9.2   CBC:  Recent Labs Lab 09/27/13 0458 09/28/13 0620  WBC 10.5 9.0  NEUTROABS 5.6 5.1  HGB 9.4* 8.4*  HCT 30.1* 26.8*  MCV 82.2 82.5  PLT 513* 498*   CBG:  Recent Labs Lab 09/27/13 0803 09/27/13 1155 09/27/13 1621 09/27/13 2212 09/27/13 2341 09/28/13 0750  GLUCAP 330* 147* 203* 414* 320* 298*    Micro Results: Recent Results (from the past 240 hour(s))  URINE CULTURE     Status: None   Collection Time    09/19/13  6:46 PM      Result  Value Ref Range Status   Specimen Description URINE, CLEAN CATCH   Final   Special Requests NONE   Final   Culture  Setup Time     Final   Value: 09/20/2013 03:48     Performed at [facility]     Final   Value: >=100,000 COLONIES/ML     Performed at [facility]   Culture     Final   Value: LACTOBACILLUS SPECIES     Note: Standardized susceptibility testing for this organism is not available.     Performed at [facility]   Report Status 09/21/2013 FINAL   Final  WOUND CULTURE     Status: None   Collection Time    09/19/13  8:50 PM      Result Value Ref Range Status   Specimen Description PERIRECTAL   Final   Special Requests NONE   Final   Gram Stain     Final   Value: RARE WBC PRESENT,BOTH PMN AND MONONUCLEAR     NO SQUAMOUS EPITHELIAL CELLS SEEN     RARE GRAM POSITIVE COCCI     IN CHAINS RARE GRAM NEGATIVE RODS     Performed at [facility]   Culture     Final   Value: Multiple Species Consistent With Normal Fecal Flora     Performed at [facility]   Report Status 09/22/2013 FINAL   Final  MRSA PCR SCREENING     Status: None   Collection Time    09/19/13 10:01 PM      Result Value Ref Range Status   MRSA by PCR NEGATIVE  NEGATIVE Final   Comment:            The GeneXpert MRSA Assay (FDA     approved for NASAL specimens     only), is one component of a     comprehensive MRSA colonization     surveillance program. It is not     intended to diagnose MRSA     infection nor to guide or     monitor treatment for     MRSA infections.  CULTURE, BLOOD (ROUTINE X 2)     Status: None   Collection Time    09/19/13 10:47 PM      Result Value Ref Range Status   Specimen Description BLOOD RIGHT ANTECUBITAL   Final   Special Requests BOTTLES DRAWN AEROBIC ONLY 5CC   Final   Culture  Setup Time     Final   Value: 09/20/2013 03:14     Performed at [facility]   Culture     Final   Value: NO GROWTH 5 DAYS      Performed at [facility]   Report Status 09/26/2013 FINAL   Final  CULTURE, BLOOD (ROUTINE X 2)     Status: None   Collection Time    09/19/13 10:57 PM      Result Value Ref Range Status   Specimen Description BLOOD RIGHT HAND   Final   Special Requests BOTTLES DRAWN AEROBIC ONLY 5CC   Final   Culture  Setup Time     Final   Value: 09/20/2013 03:14     Performed at [facility]   Culture     Final   Value: NO GROWTH 5 DAYS     Performed at [facility]   Report Status 09/26/2013 FINAL   Final  ANAEROBIC CULTURE     Status: None   Collection Time    09/20/13  1:22 AM      Result Value Ref Range Status   Specimen Description ABSCESS RIGHT LABIA   Final   Special Requests PATIENT ON FOLLOWING VANCOMYCIN,CEFAPIME   Final   Gram Stain     Final   Value: NO WBC SEEN     NO SQUAMOUS EPITHELIAL CELLS SEEN     RARE GRAM POSITIVE COCCI     IN PAIRS     Performed at [facility]   Culture     Final   Value: NO ANAEROBES ISOLATED     Performed at [facility]   Report Status 09/25/2013 FINAL   Final  CULTURE, ROUTINE-ABSCESS     Status: None   Collection Time    09/20/13  1:22 AM      Result Value Ref Range Status   Specimen Description ABSCESS RIGHT LABIA   Final   Special Requests PATIENT ON FOLLOWING VANCOMYCIN,CEFAPIME   Final   Gram Stain     Final   Value: NO WBC SEEN     NO SQUAMOUS EPITHELIAL CELLS SEEN     RARE GRAM POSITIVE COCCI  IN PAIRS     Performed at [facility]   Culture     Final   Value: RARE CANDIDA ALBICANS     Performed at [facility]   Report Status 09/23/2013 FINAL   Final  CULTURE, ROUTINE-ABSCESS     Status: None   Collection Time    09/24/13  1:44 PM      Result Value Ref Range Status   Specimen Description ABSCESS PERIRECTAL   Final   Special Requests PATIENT ON FOLLOWING VANCOMYCIN   Final   Gram Stain     Final   Value: ABUNDANT WBC PRESENT, PREDOMINANTLY PMN     NO SQUAMOUS  EPITHELIAL CELLS SEEN     NO ORGANISMS SEEN     Performed at [facility]   Culture     Final   Value: NO GROWTH 3 DAYS     Performed at [facility]   Report Status 09/27/2013 FINAL   Final  ANAEROBIC CULTURE     Status: None   Collection Time    09/24/13  1:44 PM      Result Value Ref Range Status   Specimen Description ABSCESS PERIRECTAL   Final   Special Requests PATIENT ON FOLLOWING VANCOMYCIN   Final   Gram Stain     Final   Value: ABUNDANT WBC PRESENT, PREDOMINANTLY PMN     NO SQUAMOUS EPITHELIAL CELLS SEEN     NO ORGANISMS SEEN     Performed at [facility]   Culture     Final   Value: NO ANAEROBES ISOLATED; CULTURE IN PROGRESS FOR 5 DAYS     Performed at [facility]   Report Status PENDING   Incomplete   Medications: I have reviewed the patient's current medications. Scheduled Meds: . acetaminophen  1,000 mg Oral TID  . ceFEPime (MAXIPIME) IV  1 g Intravenous Q12H  . enoxaparin (LOVENOX) injection  40 mg Subcutaneous Q24H  . insulin aspart  0-15 Units Subcutaneous TID WC  . insulin aspart  0-5 Units Subcutaneous QHS  . insulin glargine  35 Units Subcutaneous Daily  . lip balm   Topical BID  . polyethylene glycol  17 g Oral BID  . vancomycin  1,000 mg Intravenous Q6H   Continuous Infusions: . sodium chloride 1,000 mL (09/28/13 0841)   PRN Meds:.acetaminophen, alum & mag hydroxide-simeth, dextrose, diphenhydrAMINE, lactated ringers, magic mouthwash, morphine injection, ondansetron (ZOFRAN) IV, oxyCODONE  Assessment/Plan:  Sepsis 2/2 right labial abscess and persistent gluteal abscess: Sepsis resolved. Pt s/p I&D x 2 by general surgery, POD4 since last surgical intervention (5/24, 5/28). Still with some induration to area around the opened R buttock abscess, though no erythema. VSS and WBC count continues to improve (22-->10-->9 today). As this was my first day evaluating patient, it is hard to know whether this is improved from  yesterday. However, surgery seems to note some improement. Pt to go for hydrotherapy today. I spoke with surgical PA today and they agree with plan to transition to oral abx (moxifloxacin and doxycycline) today with plan to discharge to home tomorrow. Surgery notes the main issue his keeping the area clean and to continue sitz baths even at home. Wound culture unrevealing so far. - stop vancomycin and cefepime (received for total of 9 days) and switch to oral abx 6/1 (per ID, will give bactrim DS 800-160mg  BID for 1 more week--END on 6/7) - likely d/c home tomorrow - will need to follow up as  outpatient with surgery - continue morphine IV 2 mg q4h prn  - sitz baths - continue dry dressing changes, change TID and prn - blood cultures x 2- NG (final)  Uncontrolled DM1: Pt admitted with DKA, though this is now resolved. Last A1C 19.3% on 07/2013. She is on lantus 20U qHS and lispro 3U TID with meals at home. Uncontrolled diabetes definitely a contributor to recurrent infection. Need better control of blood sugar, though this will be difficult given patient's hx of noncompliance. CBGs up to 414 last night. Lantus increased 6/1 from 30U to 35U.  - continue Lantus 35 units daily and SSI moderate--will increase as needed to obtain better CBG control. - monitor CBG (goal is <180)  Diet:  Carb mod VTE ppx: Lovenox Code:  Full  Dispo: Disposition is deferred at this time, awaiting improvement of current medical problems.  Anticipated discharge in approximately 1-2 day(s).   The patient does have a current PCP Cresenciano Genre, MD) and does need an [facility] hospital follow-up appointment after discharge.  The patient does not know have transportation limitations that hinder transportation to clinic appointments.  .Services Needed at time of discharge: Y = Yes, Blank = No PT:   OT:   RN:   Equipment:   Other:     LOS: 9 days   Rebecca Eaton, MD 09/28/2013, 12:20 PM

## 2013-09-28 NOTE — Progress Notes (Signed)
Patient seen and examiend with Dr. Mechele Claude. States she wants to go home. Also complains of mild pain over gluteal abscess site. No fevers  Physical exam; Cardio- RRR, normal heart sounds Lungs- CTA b/l Ext - no pedal edema Abd- soft, non tender, non distended, BS + Gen- AAO*3 GU - mild induration around gluteal abscess with minimal drainage  Assessment and Plan:  Sepsis secondary to labial/gluteal abscesses - s/p 9 days of vanco and cefepime. Now on PO bactrim for 1 more week to complete abx course - Cultures negative till date - c/w pain control prn - surgery recommendations appreciated - c/w sitz baths and dressings  Uncontrolled DM - Lantus increased to 35 units today - Will consider premeal novolog after assessing sugars today  Case d/w patient and residents in detail

## 2013-09-28 NOTE — Progress Notes (Signed)
4 Days Post-Op  Subjective: She is tearful, and wants to go home.  She is going to get hydrotherapy this AM.  She says she did get up in the shower with this.  She also reports the labial area is better.  Objective: Vital signs in last 24 hours: Temp:  [97.5 F (36.4 C)-98.6 F (37 C)] 97.5 F (36.4 C) (06/01 0504) Pulse Rate:  [86-90] 90 (06/01 0504) Resp:  [18-20] 18 (06/01 0504) BP: (120-131)/(64-86) 120/75 mmHg (06/01 0504) SpO2:  [96 %-100 %] 100 % (06/01 0504) Weight:  [56.427 kg (124 lb 6.4 oz)] 56.427 kg (124 lb 6.4 oz) (05/31 2213) Last BM Date: 09/27/13 Afebrile, VSS Glucose 200-400 range, WBC is 9.0, H/H is down Intake/Output from previous day: 05/31 0701 - 06/01 0700 In: 900 [P.O.:600; IV Piggyback:300] Out: 1000 [Urine:1000] Intake/Output this shift:    General appearance: alert, cooperative, no distress and tearful Skin:  Labial swelling is much better.  Rectal swelling is better.  I can probe to 4 cm in the rectal wound.  It's clean, somewhat indurated.  Hydrotherapy is going to work on it now.  Lab Results:   Recent Labs  09/27/13 0458 09/28/13 0620  WBC 10.5 9.0  HGB 9.4* 8.4*  HCT 30.1* 26.8*  PLT 513* 498*    BMET  Recent Labs  09/26/13 0540 09/28/13 0620  NA 135* 137  K 4.0 4.0  CL 100 101  CO2 23 23  GLUCOSE 288* 265*  BUN 5* 7  CREATININE 0.48* 0.41*  CALCIUM 9.2 9.2   PT/INR No results found for this basename: LABPROT, INR,  in the last 72 hours  No results found for this basename: AST, ALT, ALKPHOS, BILITOT, PROT, ALBUMIN,  in the last 168 hours   Lipase     Component Value Date/Time   LIPASE 28 07/05/2013 1218     Studies/Results: No results found.  Medications: . acetaminophen  1,000 mg Oral TID  . ceFEPime (MAXIPIME) IV  1 g Intravenous Q12H  . enoxaparin (LOVENOX) injection  40 mg Subcutaneous Q24H  . insulin aspart  0-15 Units Subcutaneous TID WC  . insulin aspart  0-5 Units Subcutaneous QHS  . insulin glargine  35  Units Subcutaneous Daily  . lip balm   Topical BID  . polyethylene glycol  17 g Oral BID  . vancomycin  1,000 mg Intravenous Q6H    Assessment/Plan Poorly drained perirectal abscess; IRRIGATION AND DEBRIDEMENT PERIRECTAL ABSCESS with packing of two bottle of 1/2 inch iodinated ribbon gauze, 09/24/13, Dr. Judeth Horn  Right gluteal abscess and labial abscesses  I&D right gluteal abscess 08/18/13 Dr. Ralene Ok  Type I diabetes mellitus, uncontrolled (diagnosed age 89)  SCD/Lovenox for DVT UTI   Plan: Hydrotherapy, I encouraged her to shower or use Sitz bath 3-4 times per day.  No growth from wound culture after 3 days. She has had 9 days of Cefepime.  9 days of vancomycin. Will defer to Medicine.  LOS: 9 days    [name] 09/28/2013

## 2013-09-28 NOTE — Progress Notes (Signed)
Physical Therapy Wound Treatment Patient Details  Name: Ashley Freeman MRN: [id number] Date of Birth: 04/12/1991  Today's Date: 09/28/2013 Time: 4917-9150 Time Calculation (min): 33 min  Subjective  Subjective: Asking about going home. Patient and Family Stated Goals: to get wounds better Date of Onset: 08/18/13 Prior Treatments: I&D x 3; wet to dry dressing; recently shower and dry dressing  Pain Score: Pain Score: Pre-medicated for pain prior to session; only painful with depth measurement  Wound Assessment  Wound / Incision (Open or Dehisced) 09/19/13 Buttocks Right (Active)  Dressing Type ABD;Gauze (Comment);Moist to moist;Mesh briefs 09/28/2013 10:30 AM  Dressing Changed Changed 09/28/2013 10:30 AM  Dressing Status Clean;Dry;Intact 09/28/2013 10:30 AM  Dressing Change Frequency Other (Comment) 09/28/2013 10:30 AM  Site / Wound Assessment Bleeding;Pink 09/28/2013 10:30 AM  % Wound base Red or Granulating 100% 09/28/2013 10:30 AM  % Wound base Yellow 0% 09/28/2013 10:30 AM  % Wound base Black 0% 09/28/2013 10:30 AM  Peri-wound Assessment Edema;Induration 09/28/2013 10:30 AM  Wound Length (cm) 3 cm 09/28/2013 10:30 AM  Wound Width (cm) 3.2 cm 09/28/2013 10:30 AM  Wound Depth (cm) 4.8 cm 09/28/2013 10:30 AM  Tunneling (cm) tunnels to connect to opening in Rt perineum (medial to labia) 09/22/2013  3:11 PM  Margins Epibole (rolled edges) 09/28/2013 10:30 AM  Closure None 09/28/2013 10:30 AM  Drainage Amount Minimal 09/28/2013 10:30 AM  Drainage Description Serosanguineous 09/28/2013 10:30 AM  Non-staged Wound Description Full thickness 09/28/2013 10:30 AM  Treatment Hydrotherapy (Pulse lavage);Packing (Saline gauze) 09/28/2013 10:30 AM   Hydrotherapy Pulsed lavage therapy - wound location: Rt buttock (connects to perineum); Rt major labia (lateral) Pulsed Lavage with Suction (psi): 4 psi Pulsed Lavage with Suction - Normal Saline Used: 1000 mL Pulsed Lavage Tip: Tip with splash shield   Wound Assessment and  Plan  Wound Therapy - Assess/Plan/Recommendations Wound Therapy - Clinical Statement: Pt s/p I&D x 3 with wound currently 100% pink and no signs of necrotic tissue. Noted pt continues with elevated blood sugars and very signicant induration (although improved from prior to last I&D). Feel pt may benefit from ultrasonic hydrotherapy for it's bactericidal effects. Will initiate 09/29/13. Wound Therapy - Functional Problem List: limited mobility due to pain Factors Delaying/Impairing Wound Healing: Diabetes Mellitus;Infection - systemic/local;Immobility Hydrotherapy Plan: Dressing change;Patient/family education;Pulsatile lavage with suction;Ultrasonic wound therapy '@35'  KHz (+/- 3 KHz) Wound Therapy - Frequency: 6X / week Wound Therapy - Current Recommendations: Diabetic teaching Wound Therapy - Follow Up Recommendations: Home health RN Wound Plan: see above  Wound Therapy Goals- Improve the function of patient's integumentary system by progressing the wound(s) through the phases of wound healing (inflammation - proliferation - remodeling) by: Decrease Length/Width/Depth by (cm): .2/.2/.5 Decrease Length/Width/Depth - Progress: Goal set today Improve Drainage Characteristics: Min;Serous Improve Drainage Characteristics - Progress: Goal set today Patient/Family will be able to : verbalize "massage" techniques to areas of induration while showering (to encourage drainage) Patient/Family Instruction Goal - Progress: Goal set today Goals/treatment plan/discharge plan were made with and agreed upon by patient/family: Yes Time For Goal Achievement: 7 days Wound Therapy - Potential for Goals: Good  Goals will be updated until maximal potential achieved or discharge criteria met.  Discharge criteria: when goals achieved, discharge from hospital, MD decision/surgical intervention, no progress towards goals, refusal/missing three consecutive treatments without notification or medical reason.  GP      Jeanie Cooks Corky Blumstein 09/28/2013, 10:48 AM Pager [phone]

## 2013-09-28 NOTE — Discharge Instructions (Signed)
Please continue to apply warm compress to affected area twice daily.  Your word catheter can be in place for 2 additional week and then please follow up with your doctor to have it remove.  Take antibiotic and pain medication as prescribed.  Return if you have any concerns.   Bartholin's Cyst or Abscess Bartholin's glands are small glands located within the folds of skin (labia) along the sides of the lower opening of the vagina (birth canal). A cyst may develop when the duct of the gland becomes blocked. When this happens, fluid that accumulates within the cyst can become infected. This is known as an abscess. The Bartholin gland produces a mucous fluid to lubricate the outside of the vagina during sexual intercourse. SYMPTOMS   Patients with a small cyst may not have any symptoms.  Mild discomfort to severe pain depending on the size of the cyst and if it is infected (abscess).  Pain, redness, and swelling around the lower opening of the vagina.  Painful intercourse.  Pressure in the perineal area.  Swelling of the lips of the vagina (labia).  The cyst or abscess can be on one side or both sides of the vagina. DIAGNOSIS   A large swelling is seen in the lower vagina area by your caregiver.  Painful to touch.  Redness and pain, if it is an abscess. TREATMENT   Sometimes the cyst will go away on its own.  Apply warm wet compresses to the area or take hot sitz baths several times a day.  An incision to drain the cyst or abscess with local anesthesia.  Culture the pus, if it is an abscess.  Antibiotic treatment, if it is an abscess.  Cut open the gland and suture the edges to make the opening of the gland bigger (marsupialization).  Remove the whole gland if the cyst or abscess returns. PREVENTION   Practice good hygiene.  Clean the vaginal area with a mild soap and soft cloth when bathing.  Do not rub hard in the vaginal area when bathing.  Protect the crotch area with  a padded cushion if you take long bike rides or ride horses.  Be sure you are well lubricated when you have sexual intercourse. HOME CARE INSTRUCTIONS   If your cyst or abscess was opened, a small piece of gauze, or a drain, may have been placed in the wound to allow drainage. Do not remove this gauze or drain unless directed by your caregiver.  Wear feminine pads, not tampons, as needed for any drainage or bleeding.  If antibiotics were prescribed, take them exactly as directed. Finish the entire course.  Only take over-the-counter or prescription medicines for pain, discomfort, or fever as directed by your caregiver. SEEK IMMEDIATE MEDICAL CARE IF:   You have an increase in pain, redness, swelling, or drainage.  You have bleeding from the wound which results in the use of more than the number of pads suggested by your caregiver in 24 hours.  You have chills.  You have a fever.  You develop any new problems (symptoms) or aggravation of your existing condition. MAKE SURE YOU:   Understand these instructions.  Will watch your condition.  Will get help right away if you are not doing well or get worse. Document Released: 04/16/2005 Document Revised: 07/09/2011 Document Reviewed: 12/03/2007 [facility] Patient Information 2014 [place].  Peri-Rectal Abscess Your caregiver has diagnosed you as having a peri-rectal abscess. This is an infected area near the rectum that is  filled with pus. If the abscess is near the surface of the skin, your caregiver may open (incise) the area and drain the pus. HOME CARE INSTRUCTIONS   If your abscess was opened up and drained. A small piece of gauze may be placed in the opening so that it can drain. Do not remove the gauze unless directed by your caregiver.  A loose dressing may be placed over the abscess site. Change the dressing as often as necessary to keep it clean and dry.  After the drain is removed, the area may be washed with a  gentle antiseptic (soap) four times per day.  A warm sitz bath, warm packs or heating pad may be used for pain relief, taking care not to burn yourself.  Return for a wound check in 1 day or as directed.  An "inflatable doughnut" may be used for sitting with added comfort. These can be purchased at a drugstore or medical supply house.  To reduce pain and straining with bowel movements, eat a high fiber diet with plenty of fruits and vegetables. Use stool softeners as recommended by your caregiver. This is especially important if narcotic type pain medications were prescribed as these may cause marked constipation.  Only take over-the-counter or prescription medicines for pain, discomfort, or fever as directed by your caregiver. SEEK IMMEDIATE MEDICAL CARE IF:   You have increasing pain that is not controlled by medication.  There is increased inflammation (redness), swelling, bleeding, or drainage from the area.  An oral temperature above 102 F (38.9 C) develops.  You develop chills or generalized malaise (feel lethargic or feel "washed out").  You develop any new symptoms (problems) you feel may be related to your present problem. Document Released: 04/13/2000 Document Revised: 07/09/2011 Document Reviewed: 04/13/2008 [facility] Patient Information 2014 [place].  You can shower and use a hand held shower to clean and irrigate this area.  Do it 2-3 times per day and after each Bowel movement to be sure area is clean.  Keep a dry dressing over it to protect your clothing.

## 2013-09-28 NOTE — Progress Notes (Addendum)
Inpatient Diabetes Program Recommendations  AACE/ADA: New Consensus Statement on Inpatient Glycemic Control (2013)  Target Ranges:  Prepandial:   less than 140 mg/dL      Peak postprandial:   less than 180 mg/dL (1-2 hours)      Critically ill patients:  140 - 180 mg/dL   Results for Ashley Freeman, Ashley Freeman (MRN [id number]) as of 09/28/2013 11:06  Ref. Range 09/27/2013 08:03 09/27/2013 11:55 09/27/2013 16:21 09/27/2013 22:12 09/27/2013 23:41 09/28/2013 07:50  Glucose-Capillary Latest Range: 70-99 mg/dL 330 (H) 147 (H) 203 (H) 414 (H) 320 (H) 298 (H)   Diabetes history: DM1 Outpatient Diabetes medications: Lantus 20 units QAM, Humalog 3 units TID with meals Current orders for Inpatient glycemic control: Lantus 35 units daily (increased this morning from 30 units daily), Novolog 0-15 units AC, Novolog 0-5 units HS  Inpatient Diabetes Program Recommendations Insulin - Meal Coverage: Noted post prandial glucose consistently elevated. Please consider adding Novolog 4 units tidwc for meal coverage insulin if pt eats >50% meal  Thanks, Barnie Alderman, RN, MSN, CCRN Diabetes Coordinator Inpatient Diabetes Program [phone] (Team Pager) [phone] ([facility] office) [phone] [facility] office)

## 2013-09-29 LAB — GLUCOSE, CAPILLARY
GLUCOSE-CAPILLARY: 152 mg/dL — AB (ref 70–99)
Glucose-Capillary: 363 mg/dL — ABNORMAL HIGH (ref 70–99)

## 2013-09-29 LAB — ANAEROBIC CULTURE

## 2013-09-29 LAB — HIV ANTIBODY (ROUTINE TESTING W REFLEX): HIV 1&2 Ab, 4th Generation: NONREACTIVE

## 2013-09-29 MED ORDER — MORPHINE SULFATE 2 MG/ML IJ SOLN
2.0000 mg | Freq: Once | INTRAMUSCULAR | Status: AC
  Administered 2013-09-29: 2 mg via INTRAVENOUS

## 2013-09-29 MED ORDER — INSULIN ASPART 100 UNIT/ML ~~LOC~~ SOLN: 6.0000 [IU] | Freq: Three times a day (TID) | SUBCUTANEOUS | Status: DC

## 2013-09-29 MED ORDER — INSULIN GLARGINE 100 UNIT/ML ~~LOC~~ SOLN: 35.0000 [IU] | Freq: Every day | SUBCUTANEOUS | Status: DC

## 2013-09-29 MED ORDER — MORPHINE SULFATE 2 MG/ML IJ SOLN
INTRAMUSCULAR | Status: AC
  Administered 2013-09-29: 2 mg via INTRAVENOUS
  Filled 2013-09-29: qty 1

## 2013-09-29 MED ORDER — SULFAMETHOXAZOLE-TMP DS 800-160 MG PO TABS: 1.0000 | ORAL_TABLET | Freq: Two times a day (BID) | ORAL | Status: DC

## 2013-09-29 MED ORDER — POLYETHYLENE GLYCOL 3350 17 G PO PACK: 17.0000 g | PACK | Freq: Two times a day (BID) | ORAL | Status: DC

## 2013-09-29 NOTE — Progress Notes (Signed)
Patient seen and examiend with Dr. Mechele Claude. She feels well today and wants to go home. No fevers, no discharge  Physical exam;  Cardio- RRR, normal heart sounds  Lungs- CTA b/l  Ext - no pedal edema  Abd- soft, non tender, non distended, BS +  Gen- AAO*3  GU - wound healing well. Minimal discharge with surrounding induration  Assessment and Plan:   Sepsis secondary to labial/gluteal abscesses  - s/p 9 days of vanco and cefepime. Now on PO bactrim day 2/7 to complete abx course  - Cultures negative till date  - c/w pain control prn  - surgery recommendations appreciated - c/w sitz baths and dressings. Stable for d/c home per surgery   Uncontrolled DM  - Follow up in clinic. May need titration of lantus - Diabetic Coordinator recommendations appreciated - Will increase premeal novolog to 6 units today  Pt stable for d/c home today Case d/w patient and residents in detail

## 2013-09-29 NOTE — Progress Notes (Signed)
Physical Therapy Wound Treatment Patient Details  Name: Ashley Freeman MRN: [id number] Date of Birth: Nov 26, 1990  Today's Date: 09/29/2013 Time: 2919-1660 Time Calculation (min): 25 min  Subjective  Subjective: "Maybe I didn't need that," pt stated after insisting on IV pain meds prior to hydrotherapy.  Pain Score: Pain Score: Pt reports not too bad after premedicated.  Wound Assessment  Wound / Incision (Open or Dehisced) 09/19/13 Buttocks Right (Active)  Dressing Type ABD;Gauze (Comment);Moist to moist;Mesh briefs 09/29/2013 12:33 PM  Dressing Changed Changed 09/29/2013 12:33 PM  Dressing Status Clean;Dry;Intact 09/29/2013 12:33 PM  Dressing Change Frequency Other (Comment) 09/28/2013 10:30 AM  Site / Wound Assessment Red 09/29/2013 12:33 PM  % Wound base Red or Granulating 100% 09/29/2013 12:33 PM  % Wound base Yellow 0% 09/29/2013 12:33 PM  % Wound base Black 0% 09/29/2013 12:33 PM  % Wound base Other (Comment) 0% 09/29/2013 12:33 PM  Peri-wound Assessment Edema;Induration 09/29/2013 12:33 PM  Wound Length (cm) 3 cm 09/28/2013 10:30 AM  Wound Width (cm) 3.2 cm 09/28/2013 10:30 AM  Wound Depth (cm) 4.8 cm 09/28/2013 10:30 AM  Tunneling (cm) tunnels to connect to opening in Rt perineum (medial to labia) 09/22/2013  3:11 PM  Margins Epibole (rolled edges) 09/29/2013 12:33 PM  Closure None 09/29/2013 12:33 PM  Drainage Amount Minimal 09/29/2013 12:33 PM  Drainage Description Serosanguineous 09/29/2013 12:33 PM  Non-staged Wound Description Full thickness 09/28/2013 10:30 AM  Treatment Hydrotherapy (Pulse lavage);Packing (Saline gauze) 09/29/2013 12:33 PM   Hydrotherapy Pulsed lavage therapy - wound location: Rt buttock (connects to perineum); Rt major labia (lateral) Pulsed Lavage with Suction (psi): 4 psi Pulsed Lavage with Suction - Normal Saline Used: 1000 mL Pulsed Lavage Tip: Tip with splash shield   Wound Assessment and Plan  Wound Therapy - Assess/Plan/Recommendations Wound Therapy - Clinical  Statement: Wound bed remains clean. Pt for dc home today. Hydrotherapy Plan: Dressing change;Patient/family education;Pulsatile lavage with suction Wound Therapy - Frequency: 6X / week Wound Therapy - Follow Up Recommendations: Home health RN Wound Plan: see above  Wound Therapy Goals- Improve the function of patient's integumentary system by progressing the wound(s) through the phases of wound healing (inflammation - proliferation - remodeling) by: Decrease Length/Width/Depth by (cm): .2/.2/.5 Decrease Length/Width/Depth - Progress: Progressing toward goal Improve Drainage Characteristics: Min;Serous Improve Drainage Characteristics - Progress: Progressing toward goal Patient/Family will be able to : verbalize "massage" techniques to areas of induration while showering (to encourage drainage) Patient/Family Instruction Goal - Progress: Progressing toward goal  Goals will be updated until maximal potential achieved or discharge criteria met.  Discharge criteria: when goals achieved, discharge from hospital, MD decision/surgical intervention, no progress towards goals, refusal/missing three consecutive treatments without notification or medical reason.  GP     Shary Decamp Kewon Statler 09/29/2013, 12:38 PM  [employer] PT [phone]

## 2013-09-29 NOTE — Care Management Note (Signed)
CARE MANAGEMENT NOTE 09/29/2013  Patient:  TALER, BOUTELL   Account Number:  [id number]  Date Initiated:  09/22/2013  Documentation initiated by:  [facility]  Subjective/Objective Assessment:   sepsis, DKA     Action/Plan:   lives with mother  09/23/2013 [facility] notified of order for [facility], however wound care plan not clear at this time.  09/29/13 D/c to home with sitz bath and HHRN for wound management   Anticipated DC Date:  09/29/2013   Anticipated DC Plan:  [place]  CM consult      [facility] Choice  HOME HEALTH   Choice offered to / List presented to:  C-1 Patient        [place] arranged  HH-1 RN      [place].   Status of service:  Completed, signed off Medicare Important Message given?   (If response is "NO", the following Medicare IM given date fields will be blank) Date Medicare IM given:   Date Additional Medicare IM given:    Discharge Disposition:  [place]  Per UR Regulation:    If discussed at Long Length of Stay Meetings, dates discussed:    Comments:  09/29/2013 [place], [place] CM referral: [place] / Butch Penny callled with referral, plan for discharge today  09/22/2013 1600 NCM spoke to pt and gave permission to speak to mother, [place] # [id number]. Pt states she had [facility] in the past. Requesting [facility] for [facility]. Waiting final recommendations for dc. Notified [facility] of [place] needed. Jonnie Finner RN CCM Case Mgmt phone [phone]

## 2013-09-29 NOTE — Progress Notes (Signed)
Inpatient Diabetes Program Recommendations  AACE/ADA: New Consensus Statement on Inpatient Glycemic Control (2013)  Target Ranges:  Prepandial:   less than 140 mg/dL      Peak postprandial:   less than 180 mg/dL (1-2 hours)      Critically ill patients:  140 - 180 mg/dL   Results for NIGEL, YONG (MRN [id number]) as of 09/29/2013 09:05  Ref. Range 09/28/2013 07:50 09/28/2013 12:32 09/28/2013 17:21 09/28/2013 21:10 09/29/2013 07:32  Glucose-Capillary Latest Range: 70-99 mg/dL 298 (H) 238 (H) 281 (H) 166 (H) 363 (H)   Diabetes history: DM1  Outpatient Diabetes medications: Lantus 20 units QAM, Humalog 3 units TID with meals  Current orders for Inpatient glycemic control: Lantus 35 units daily, Novolog 0-15 units AC, Novolog 0-5 units HS, Novolog 4 units TID with meals (meal coverage)  Inpatient Diabetes Program Recommendations Insulin - Basal: Please consider increasing Lantus to 38 units daily. Insulin - Meal Coverage: Please consider increasing Novlog meal coverage to 8 units TID with meals.  Thanks, Barnie Alderman, RN, MSN, CCRN Diabetes Coordinator Inpatient Diabetes Program [phone] (Team Pager) [phone] ([facility] office) [phone] [facility] office)

## 2013-09-29 NOTE — Progress Notes (Signed)
Pt discharged home per MD. NSL out with catheter intact. Discharge instructions provided. Prescriptions provided for clindamyacin and oxyconone/apap. Other prescriptions called in to [facility] on [address]. Pt escorted out via wheelchair and nurse tech. Manya Silvas, RN

## 2013-09-29 NOTE — Progress Notes (Signed)
Subjective: Patient feels as though her pain is improving. No chills. Overall no specific complaints. She has been doing hydrotherapy daily and she thinks this is helping. Pt wishes to go home today, surgery agrees with discharge as well.   Objective: Vital signs in last 24 hours: Filed Vitals:   09/28/13 1016 09/28/13 2103 09/29/13 0445 09/29/13 0946  BP: 129/85 124/83 116/76 114/76  Pulse:  95 90 105  Temp: 98.2 F (36.8 C) 98.3 F (36.8 C) 98.1 F (36.7 C) 98.4 F (36.9 C)  TempSrc: Oral   Oral  Resp: 18 18 18 18   Height:      Weight:      SpO2: 100% 96% 99% 98%   Weight change:   Intake/Output Summary (Last 24 hours) at 09/29/13 1212 Last data filed at 09/29/13 0857  Gross per 24 hour  Intake    480 ml  Output      2 ml  Net    478 ml   Physical Exam: General: lying on her side in bed, not very talkative HEENT: [place]/AT, MMM Cardiac: RRR Pulm:  CTAB, normal WOB Abd: soft, nontender, nondistended, BS normoactive GU:  Some induration surrounding the open area on R buttock, no surrounding erythema, minimal yellow/clear drainage Neuro: alert and oriented X3, responding appropriately  Lab Results: Basic Metabolic Panel:  Recent Labs Lab 09/26/13 0540 09/28/13 0620  NA 135* 137  K 4.0 4.0  CL 100 101  CO2 23 23  GLUCOSE 288* 265*  BUN 5* 7  CREATININE 0.48* 0.41*  CALCIUM 9.2 9.2   CBC:  Recent Labs Lab 09/27/13 0458 09/28/13 0620  WBC 10.5 9.0  NEUTROABS 5.6 5.1  HGB 9.4* 8.4*  HCT 30.1* 26.8*  MCV 82.2 82.5  PLT 513* 498*   CBG:  Recent Labs Lab 09/27/13 2341 09/28/13 0750 09/28/13 1232 09/28/13 1721 09/28/13 2110 09/29/13 0732  GLUCAP 320* 298* 238* 281* 166* 363*    Micro Results: Recent Results (from the past 240 hour(s))  URINE CULTURE     Status: None   Collection Time    09/19/13  6:46 PM      Result Value Ref Range Status   Specimen Description URINE, CLEAN CATCH   Final   Special Requests NONE   Final   Culture  Setup Time      Final   Value: 09/20/2013 03:48     Performed at [facility]     Final   Value: >=100,000 COLONIES/ML     Performed at [facility]   Culture     Final   Value: LACTOBACILLUS SPECIES     Note: Standardized susceptibility testing for this organism is not available.     Performed at [facility]   Report Status 09/21/2013 FINAL   Final  WOUND CULTURE     Status: None   Collection Time    09/19/13  8:50 PM      Result Value Ref Range Status   Specimen Description PERIRECTAL   Final   Special Requests NONE   Final   Gram Stain     Final   Value: RARE WBC PRESENT,BOTH PMN AND MONONUCLEAR     NO SQUAMOUS EPITHELIAL CELLS SEEN     RARE GRAM POSITIVE COCCI     IN CHAINS RARE GRAM NEGATIVE RODS     Performed at [facility]   Culture     Final   Value: Multiple Species Consistent With Normal  Fecal Flora     Performed at [facility]   Report Status 09/22/2013 FINAL   Final  MRSA PCR SCREENING     Status: None   Collection Time    09/19/13 10:01 PM      Result Value Ref Range Status   MRSA by PCR NEGATIVE  NEGATIVE Final   Comment:            The GeneXpert MRSA Assay (FDA     approved for NASAL specimens     only), is one component of a     comprehensive MRSA colonization     surveillance program. It is not     intended to diagnose MRSA     infection nor to guide or     monitor treatment for     MRSA infections.  CULTURE, BLOOD (ROUTINE X 2)     Status: None   Collection Time    09/19/13 10:47 PM      Result Value Ref Range Status   Specimen Description BLOOD RIGHT ANTECUBITAL   Final   Special Requests BOTTLES DRAWN AEROBIC ONLY 5CC   Final   Culture  Setup Time     Final   Value: 09/20/2013 03:14     Performed at [facility]   Culture     Final   Value: NO GROWTH 5 DAYS     Performed at [facility]   Report Status 09/26/2013 FINAL   Final  CULTURE, BLOOD (ROUTINE X 2)     Status: None    Collection Time    09/19/13 10:57 PM      Result Value Ref Range Status   Specimen Description BLOOD RIGHT HAND   Final   Special Requests BOTTLES DRAWN AEROBIC ONLY 5CC   Final   Culture  Setup Time     Final   Value: 09/20/2013 03:14     Performed at [facility]   Culture     Final   Value: NO GROWTH 5 DAYS     Performed at [facility]   Report Status 09/26/2013 FINAL   Final  ANAEROBIC CULTURE     Status: None   Collection Time    09/20/13  1:22 AM      Result Value Ref Range Status   Specimen Description ABSCESS RIGHT LABIA   Final   Special Requests PATIENT ON FOLLOWING VANCOMYCIN,CEFAPIME   Final   Gram Stain     Final   Value: NO WBC SEEN     NO SQUAMOUS EPITHELIAL CELLS SEEN     RARE GRAM POSITIVE COCCI     IN PAIRS     Performed at [facility]   Culture     Final   Value: NO ANAEROBES ISOLATED     Performed at [facility]   Report Status 09/25/2013 FINAL   Final  CULTURE, ROUTINE-ABSCESS     Status: None   Collection Time    09/20/13  1:22 AM      Result Value Ref Range Status   Specimen Description ABSCESS RIGHT LABIA   Final   Special Requests PATIENT ON FOLLOWING VANCOMYCIN,CEFAPIME   Final   Gram Stain     Final   Value: NO WBC SEEN     NO SQUAMOUS EPITHELIAL CELLS SEEN     RARE GRAM POSITIVE COCCI     IN PAIRS     Performed at [facility]  Final   Value: RARE CANDIDA ALBICANS     Performed at [facility]   Report Status 09/23/2013 FINAL   Final  CULTURE, ROUTINE-ABSCESS     Status: None   Collection Time    09/24/13  1:44 PM      Result Value Ref Range Status   Specimen Description ABSCESS PERIRECTAL   Final   Special Requests PATIENT ON FOLLOWING VANCOMYCIN   Final   Gram Stain     Final   Value: ABUNDANT WBC PRESENT, PREDOMINANTLY PMN     NO SQUAMOUS EPITHELIAL CELLS SEEN     NO ORGANISMS SEEN     Performed at [facility]   Culture     Final   Value: NO GROWTH 3 DAYS      Performed at [facility]   Report Status 09/27/2013 FINAL   Final  ANAEROBIC CULTURE     Status: None   Collection Time    09/24/13  1:44 PM      Result Value Ref Range Status   Specimen Description ABSCESS PERIRECTAL   Final   Special Requests PATIENT ON FOLLOWING VANCOMYCIN   Final   Gram Stain     Final   Value: ABUNDANT WBC PRESENT, PREDOMINANTLY PMN     NO SQUAMOUS EPITHELIAL CELLS SEEN     NO ORGANISMS SEEN     Performed at [facility]   Culture     Final   Value: NO ANAEROBES ISOLATED; CULTURE IN PROGRESS FOR 5 DAYS     Performed at [facility]   Report Status PENDING   Incomplete   Medications: I have reviewed the patient's current medications. Scheduled Meds: . acetaminophen  1,000 mg Oral TID  . enoxaparin (LOVENOX) injection  40 mg Subcutaneous Q24H  . insulin aspart  0-15 Units Subcutaneous TID WC  . insulin aspart  0-5 Units Subcutaneous QHS  . insulin aspart  4 Units Subcutaneous TID WC  . insulin glargine  35 Units Subcutaneous Daily  . lip balm   Topical BID  . polyethylene glycol  17 g Oral BID  . sulfamethoxazole-trimethoprim  1 tablet Oral Q12H   Continuous Infusions:   PRN Meds:.acetaminophen, alum & mag hydroxide-simeth, dextrose, diphenhydrAMINE, magic mouthwash, ondansetron (ZOFRAN) IV, oxyCODONE  Assessment/Plan:  Sepsis 2/2 right labial abscess and persistent gluteal abscess: VS remain stable, pt clinically continues to improve. [facility] RN for wound care has been set up. Stress to patient the importance of insulin and antibiotic compliance as well as frequent sitz baths/cleaning of her wounds at home to insure the infection resolves. She was given follow up in our clinic later this week to assess compliance and continued clinical improvement. -discharge home with bactrim DS 800-160mg  BID x 6 more days -Wound CX NGTD -see insulin therapy below -sitz baths 3-4 times daily -HH RN (wound care) -pt instructed to call surgery to  schedule herself a follow up appointment   Uncontrolled DM1: Pt admitted with DKA, though this is now resolved. Last A1C 19.3% on 07/2013. She is on lantus 20U qHS and lispro 3U TID with meals at home. Uncontrolled diabetes definitely a contributor to recurrent infection. Need better control of blood sugar, though this will be difficult given patient's hx of noncompliance. Better control of CBGs overnight with increased dose of lantus and addition of premeal novolog yesterday. - continue Lantus 35 units daily at discharge - increase novolog to 6U TID WC at discharge - monitor CBG (goal is <180)  Diet:  Carb mod VTE ppx: Lovenox Code:  Full  Dispo: Discharge today.  The patient does have a current PCP Cresenciano Genre, MD) and does need an [facility] hospital follow-up appointment after discharge.  The patient does not know have transportation limitations that hinder transportation to clinic appointments.  .Services Needed at time of discharge: Y = Yes, Blank = No PT:   OT:   RN:   Equipment:   Other:     LOS: 10 days   Rebecca Eaton, MD 09/29/2013, 12:12 PM

## 2013-09-29 NOTE — Progress Notes (Signed)
I have seen and examined the patient and agree with the assessment and plans.  Mirissa Lopresti A. Taran Hable  MD, FACS  

## 2013-09-29 NOTE — Progress Notes (Signed)
Patient ID: Ashley Freeman, female   DOB: [birth year], 23 y.o.   MRN: [id number] 5 Days Post-Op  Subjective: Pt feels ok.  No c/o  Objective: Vital signs in last 24 hours: Temp:  [98.1 F (36.7 C)-98.3 F (36.8 C)] 98.1 F (36.7 C) (06/02 0445) Pulse Rate:  [90-95] 90 (06/02 0445) Resp:  [18] 18 (06/02 0445) BP: (116-129)/(76-85) 116/76 mmHg (06/02 0445) SpO2:  [96 %-100 %] 99 % (06/02 0445) Last BM Date: 09/28/13  Intake/Output from previous day: 06/01 0701 - 06/02 0700 In: 840 [P.O.:840] Out: 452 [Urine:452] Intake/Output this shift:    PE: Skin: buttock wound is clean and packed.  Some surrounding induration noted, but no further erythema or purulent drainage  Lab Results:   Recent Labs  09/27/13 0458 09/28/13 0620  WBC 10.5 9.0  HGB 9.4* 8.4*  HCT 30.1* 26.8*  PLT 513* 498*   BMET  Recent Labs  09/28/13 0620  NA 137  K 4.0  CL 101  CO2 23  GLUCOSE 265*  BUN 7  CREATININE 0.41*  CALCIUM 9.2   PT/INR No results found for this basename: LABPROT, INR,  in the last 72 hours CMP     Component Value Date/Time   NA 137 09/28/2013 0620   K 4.0 09/28/2013 0620   CL 101 09/28/2013 0620   CO2 23 09/28/2013 0620   GLUCOSE 265* 09/28/2013 0620   BUN 7 09/28/2013 0620   CREATININE 0.41* 09/28/2013 0620   CREATININE 0.51 08/24/2013 0957   CALCIUM 9.2 09/28/2013 0620   PROT 6.6 09/19/2013 1830   ALBUMIN 2.7* 09/19/2013 1830   AST 10 09/19/2013 1830   ALT <5 09/19/2013 1830   ALKPHOS 138* 09/19/2013 1830   BILITOT 0.2* 09/19/2013 1830   GFRNONAA >90 09/28/2013 0620   GFRNONAA >89 08/24/2013 0957   GFRAA >90 09/28/2013 0620   GFRAA >89 08/24/2013 0957   Lipase     Component Value Date/Time   LIPASE 28 07/05/2013 1218       Studies/Results: No results found.  Anti-infectives: Anti-infectives   Start     Dose/Rate Route Frequency Ordered Stop   09/28/13 1400  levofloxacin (LEVAQUIN) tablet 750 mg  Status:  Discontinued     750 mg Oral Daily 09/28/13 1247 09/28/13 1258    09/28/13 1400  doxycycline (VIBRA-TABS) tablet 100 mg  Status:  Discontinued     100 mg Oral Every 12 hours 09/28/13 1247 09/28/13 1258   09/28/13 1400  sulfamethoxazole-trimethoprim (BACTRIM DS) 800-160 MG per tablet 1 tablet     1 tablet Oral Every 12 hours 09/28/13 1258     09/27/13 1419  vancomycin (VANCOCIN) IVPB 1000 mg/200 mL premix  Status:  Discontinued     1,000 mg 200 mL/hr over 60 Minutes Intravenous Every 6 hours 09/27/13 1200 09/28/13 1244   09/26/13 0030  vancomycin (VANCOCIN) 1,250 mg in sodium chloride 0.9 % 250 mL IVPB  Status:  Discontinued     1,250 mg 166.7 mL/hr over 90 Minutes Intravenous Every 8 hours 09/25/13 1718 09/27/13 1200   09/24/13 1630  vancomycin (VANCOCIN) IVPB 1000 mg/200 mL premix  Status:  Discontinued     1,000 mg 200 mL/hr over 60 Minutes Intravenous Every 8 hours 09/24/13 1031 09/25/13 1718   09/22/13 2100  vancomycin (VANCOCIN) IVPB 1000 mg/200 mL premix  Status:  Discontinued     1,000 mg 200 mL/hr over 60 Minutes Intravenous Every 6 hours 09/22/13 1616 09/24/13 1031   09/21/13 2200  vancomycin (VANCOCIN)  IVPB 750 mg/150 ml premix  Status:  Discontinued     750 mg 150 mL/hr over 60 Minutes Intravenous Every 8 hours 09/21/13 1553 09/22/13 1616   09/21/13 1600  vancomycin (VANCOCIN) IVPB 750 mg/150 ml premix  Status:  Discontinued     750 mg 150 mL/hr over 60 Minutes Intravenous Every 8 hours 09/21/13 1548 09/21/13 1553   09/21/13 1600  vancomycin (VANCOCIN) IVPB 1000 mg/200 mL premix     1,000 mg 200 mL/hr over 60 Minutes Intravenous  Once 09/21/13 1553 09/21/13 1700   09/19/13 2230  ceFEPIme (MAXIPIME) 1 g in dextrose 5 % 50 mL IVPB  Status:  Discontinued     1 g 100 mL/hr over 30 Minutes Intravenous Every 12 hours 09/19/13 2225 09/28/13 1244   09/19/13 2230  vancomycin (VANCOCIN) 500 mg in sodium chloride 0.9 % 100 mL IVPB  Status:  Discontinued     500 mg 100 mL/hr over 60 Minutes Intravenous Every 8 hours 09/19/13 2226 09/21/13 1548    09/19/13 1800  clindamycin (CLEOCIN) IVPB 600 mg     600 mg 100 mL/hr over 30 Minutes Intravenous  Once 09/19/13 1755 09/19/13 2054   09/19/13 0000  clindamycin (CLEOCIN) 150 MG capsule     150 mg Oral Every 6 hours 09/19/13 1558         Assessment/Plan  1.  POD 5, s/p I&D of perirectal abscess  Plan: 1. Patient stable for dc home from our standpoint. 2. Would give a total of 14 days of abx therapy.  She has had 10 so far.  Her original culture reveal some yeast.  Will defer to medicine if they would like to give anything to help cover that.  Otherwise some augmentin is likely fine at home to complete her full course.  LOS: 10 days    Henreitta Cea 09/29/2013, 8:32 AM Pager: [phone]

## 2013-09-30 NOTE — Discharge Summary (Signed)
Name: Ashley Freeman MRN: [id number] DOB: 12-02-1990 23 y.o. PCP: Cresenciano Genre, MD  Date of Admission: 09/19/2013  2:25 PM Date of Discharge: 09/29/2013 Attending Physician: Aldine Contes, MD  Discharge Diagnosis:   Sepsis secondary to right gluteal and labial abscesses   Diabetic ketoacidosis   Diabetes mellitus type 1, uncontrolled  Discharge Medications:   Medication List    STOP taking these medications       insulin glargine 100 unit/mL Sopn  Commonly known as:  LANTUS  Replaced by:  insulin glargine 100 UNIT/ML injection     insulin lispro 100 UNIT/ML KiwkPen  Commonly known as:  HUMALOG KWIKPEN      TAKE these medications                insulin aspart 100 UNIT/ML injection  Commonly known as:  NOVOLOG  Inject 6 Units into the skin 3 (three) times daily before meals.     insulin glargine 100 UNIT/ML injection  Commonly known as:  LANTUS  Inject 0.35 mLs (35 Units total) into the skin daily.     oxyCODONE-acetaminophen 5-325 MG per tablet  Commonly known as:  PERCOCET/ROXICET  Take 2 tablets by mouth every 4 (four) hours as needed for severe pain.     polyethylene glycol packet  Commonly known as:  MIRALAX / GLYCOLAX  Take 17 g by mouth 2 (two) times daily.     sulfamethoxazole-trimethoprim 800-160 MG per tablet  Commonly known as:  BACTRIM DS  Take 1 tablet by mouth every 12 (twelve) hours.        Disposition and follow-up:   23 was discharged from [facility] in Stable condition.  At the hospital follow up visit please address:  1.  Blood glucose control on current regimen.  This may need to be increased. Please emphasize importance of good blood sugar control for wound healing.  2. Please ensure that the patient is taking Bactrim DS and NOT clindamycin.  It appears that clindamycin was initially prescribed from the ED and it inadvertently crossed over onto her hospital discharge AVS.    3. Please  ensure that she has scheduled follow-up with [facility]?  4.  Has an RN been coming to her home to change dressing?  5.  Labs / imaging needed at time of follow-up: CBG  6.  Pending labs/ test needing follow-up: none  Follow-up Appointments: Follow-up Information   Follow up with Karlyn Agee In 1 week. (for further management)    Contact information:   [phone]      Follow up with WYATT, Kathryne Eriksson, MD. Schedule an appointment as soon as possible for a visit in 2 weeks. (Call for a folow up appointment in 2-3 weeks. Sooner if you have a problem.)    Specialty:  General Surgery   Contact information:   [place] [place], [place] [address] [phone]       Follow up with Juluis Mire, MD On 10/01/2013. (3:15pm)    Specialty:  Internal Medicine   Contact information:   [place] [address] [phone]       Follow up with [facility] (home health RN visits)    Contact information:   [place] [address] [phone]       Discharge Instructions: Discharge Instructions   Call MD for:  persistant nausea and vomiting    Complete  by:  As directed      Call MD for:  redness, tenderness, or signs of infection (pain, swelling, redness, odor or green/yellow discharge around incision site)    Complete by:  As directed      Call MD for:  severe uncontrolled pain    Complete by:  As directed      Call MD for:  temperature >100.4    Complete by:  As directed      Diet - low sodium heart healthy    Complete by:  As directed      Increase activity slowly    Complete by:  As directed            Consultations:  General Surgery  Procedures Performed:  Dg Chest 2 View  09/24/2013   CLINICAL DATA:  Chest pain and cough  EXAM: CHEST  2 VIEW  COMPARISON:  Sep 23, 2013  FINDINGS: There has been significant interval clearing of consolidation from the left lung. There  does remain patchy infiltrate in the left mid and lower lung zones, however. The right lung is clear. Heart size and pulmonary vascularity are normal. No adenopathy. No bone lesions.  IMPRESSION: Partial clearing of infiltrate from the left lung. There is still patchy infiltrate in the left mid and lower lung zone regions. No new opacity. Right lung is clear.   Electronically Signed   By: Lowella Grip M.D.   On: 09/24/2013 08:47   Dg Chest Port 1 View  09/24/2013   CLINICAL DATA:  Sudden onset of mid chest pain.  EXAM: PORTABLE CHEST - 1 VIEW  COMPARISON:  Chest radiograph performed 08/16/2013  FINDINGS: The lungs are hypoexpanded. Left basilar airspace opacification raises concern for pneumonia. Vascular congestion is noted; asymmetric interstitial edema might have a similar appearance. A small left pleural effusion is suspected. No pneumothorax is seen.  The cardiomediastinal silhouette is borderline normal in size. No acute osseous abnormalities are seen.  IMPRESSION: Lungs hypoexpanded. Left basilar airspace opacification raises concern for pneumonia. Suspect small left pleural effusion. Vascular congestion noted; asymmetric interstitial edema might have a similar appearance.   Electronically Signed   By: Garald Balding M.D.   On: 09/24/2013 03:24   Admission HPI:  Ashley Freeman is 23 year old woman with history of DM1 (A1C 19.3% in 07/2013) and medication noncompliance who presents with extensive perineal abscesses, DKA.  Patient states she noticed a large bump in her vaginal area 2 days ago after shaving the area and came to ED at that time to have it drained. However, the bump did not resolve after and has been increasing in size since so she returned to the ED to have it drained again. The area is quite painful but has not been draining at home. She has been taking Aleve without relief. Of note, she is also s/p I&D (multiple organisms, none predominant) by Dr. Rosendo Gros of a right gluteal abscess  during 07/2012 admission but feels that this area is healed. Denies fever.  In terms of her diabetes, patient states she has been much more compliant with her insulin regimen since last hospital discharge. She has been taking Lantus 20 units BID (though it is only prescribed daily), Humalog 6 units TID with meals (though it is prescribed 3 units daily). Denies chest pain, abdominal pain, nausea/vomiting, diarrhea, polyuria, polydipsia, dysuria, weakness, or syncope. She has moved in with her mother which is a much better living arrangement for her than previously; she continues to work  at [place].  In ED, patient afebrile but with leucocytosis to 19 and large right Bartholin gland abscess on exam; now s/p I&D in ED with placement of Wert catheter. She was also found to be in DKA with glucose 705, AG 19, bicarb 19, K 3.7, small ketonuria; lactic acid also mildly elevated to 2.9. She was started on IV clindamycin, DKA protocol, and admitted to IMTS for further management.   Hospital Course by problem list:  Sepsis secondary to right gluteal and labial abscess: The patient presented in DKA which was most likely precipitated by a persistent right gluteal abscess and a new labial abscess.  She met sepsis criteria at admission and was given fluids and started on broad spectrum IV antibiotics.  She was taken to the OR for I&D on the night of admission.  DKA was treated as below.  Antibiotics were continued, the patient received frequent dressing changes and pain was controlled.  Surgery felt she needed further debridement so she was taken to the OR again on 09/24/13.  She continued to improve and was switched to oral Bactrim. Wound cultures x 2 both negative. BCx negative. She took sitz baths and dressings were changed per surgery recommendations.  She was discharged in stable condition on Bactrim DS for another 5 days of treatment to complete the course.  She was instructed to continue sitz baths at home and schedule  Surgery follow-up with [facility].  Emphasized importance of antibiotic compliance and good diabetic control.      Diabetic ketoacidosis in the setting of uncontrolled DM type 1: The patient presented with glucose 705, AG 19 and ketonuria consistent with DKA.  She was started on an insulin drip and electrolytes and AG monitored every two hours until resolution of DKA.  She was then started on a diet and transitioned to subcutaneous insulin 20 units and SSI moderate.  Insulin was titrated up to Lantus 35 units daily and Novolog 6 units TID by day of discharge.  Hospital follow-up has been arranged in [facility] on 10/01/2013.  She may require further up-titration of her insulin at that visit.     Discharge Vitals:   BP 114/76  Pulse 105  Temp(Src) 98.4 F (36.9 C) (Oral)  Resp 18  Ht '5\' 3"'  (1.6 m)  Wt 124 lb 6.4 oz (56.427 kg)  BMI 22.04 kg/m2  SpO2 98%  LMP 08/24/2013  Discharge Labs:  Results for orders placed during the hospital encounter of 09/19/13 (from the past 24 hour(s))  GLUCOSE, CAPILLARY     Status: Abnormal   Collection Time    09/29/13 11:57 AM      Result Value Ref Range   Glucose-Capillary 152 (*) 70 - 99 mg/dL    Signed: Rebecca Eaton, MD 09/30/2013, 10:33 AM   Time Spent on Discharge: 35 minutes Services Ordered on Discharge: home health RN Equipment Ordered on Discharge: none

## 2013-10-01 ENCOUNTER — Telehealth: Payer: Self-pay | Admitting: Internal Medicine

## 2013-10-01 ENCOUNTER — Ambulatory Visit: Payer: Self-pay | Admitting: Internal Medicine

## 2013-10-01 NOTE — Telephone Encounter (Signed)
Pt did not show for her hospital follow-up appointment today. I called [facility] to schedule a hospital follow-up appointment. Due to missing her previous appointment and no current insurance, she will have to pay a $81 co-pay or possibly waived if surgeon approves. Pt will be called by [facility] if fee is to be waived on 6/5. I called pt and updated her regarding this. She reports doing well after her hospital discharge and is taking Bactrim DS which she was instructed to take for 5 days. Pt also stated she is having frequent dressing changes per her mother and taking sitz baths. She has also been taking Lantus 35 U daily and Novolog 6 U TID with meals. She was instructed to make another clinic visit appointment as soon as possible which she stated she would do.

## 2013-10-01 NOTE — Discharge Summary (Signed)
INTERNAL MEDICINE ATTENDING DISCHARGE COSIGN   I evaluated the patient on the day of discharge and discussed the discharge plan with my resident team. I agree with the discharge documentation and disposition.   Ashley Freeman 10/01/2013, 3:02 PM

## 2013-11-10 ENCOUNTER — Inpatient Hospital Stay: Payer: Self-pay | Admitting: Internal Medicine

## 2013-11-10 LAB — CBC WITH DIFFERENTIAL/PLATELET
BASOS ABS: 0.1 10*3/uL (ref 0.0–0.1)
Basophil %: 1.1 %
EOS ABS: 0.1 10*3/uL (ref 0.0–0.7)
EOS PCT: 0.8 %
HCT: 43.9 % (ref 35.0–47.0)
HGB: 13.8 g/dL (ref 12.0–16.0)
LYMPHS PCT: 36.8 %
Lymphocyte #: 3.3 10*3/uL (ref 1.0–3.6)
MCH: 25.3 pg — ABNORMAL LOW (ref 26.0–34.0)
MCHC: 31.3 g/dL — AB (ref 32.0–36.0)
MCV: 81 fL (ref 80–100)
MONOS PCT: 4.5 %
Monocyte #: 0.4 x10 3/mm (ref 0.2–0.9)
NEUTROS PCT: 56.8 %
Neutrophil #: 5 10*3/uL (ref 1.4–6.5)
Platelet: 287 10*3/uL (ref 150–440)
RBC: 5.43 10*6/uL — ABNORMAL HIGH (ref 3.80–5.20)
RDW: 15.1 % — ABNORMAL HIGH (ref 11.5–14.5)
WBC: 8.9 10*3/uL (ref 3.6–11.0)

## 2013-11-10 LAB — COMPREHENSIVE METABOLIC PANEL
Albumin: 3.9 g/dL (ref 3.4–5.0)
Alkaline Phosphatase: 77 U/L
Anion Gap: 15 (ref 7–16)
BUN: 13 mg/dL (ref 7–18)
Bilirubin,Total: 0.5 mg/dL (ref 0.2–1.0)
CHLORIDE: 95 mmol/L — AB (ref 98–107)
Calcium, Total: 9.4 mg/dL (ref 8.5–10.1)
Co2: 20 mmol/L — ABNORMAL LOW (ref 21–32)
Creatinine: 0.71 mg/dL (ref 0.60–1.30)
EGFR (African American): >60
EGFR (Non-African Amer.): >60
Glucose: 604 mg/dL (ref 65–99)
OSMOLALITY: 289 (ref 275–301)
Potassium: 4.1 mmol/L (ref 3.5–5.1)
SGOT(AST): 12 U/L — ABNORMAL LOW (ref 15–37)
SGPT (ALT): 10 U/L — ABNORMAL LOW (ref 12–78)
SODIUM: 130 mmol/L — AB (ref 136–145)
Total Protein: 8 g/dL (ref 6.4–8.2)

## 2013-11-10 LAB — URINALYSIS, COMPLETE
BILIRUBIN, UR: NEGATIVE
BLOOD: NEGATIVE
Glucose,UR: >=500 mg/dL (ref 0–75)
Nitrite: NEGATIVE
PROTEIN: NEGATIVE
Ph: 5 (ref 4.5–8.0)
RBC,UR: 9 /HPF (ref 0–5)
SPECIFIC GRAVITY: 1.031 (ref 1.003–1.030)

## 2013-11-10 LAB — BETA-HYDROXYBUTYRIC ACID

## 2013-11-11 LAB — BASIC METABOLIC PANEL
Anion Gap: 9 (ref 7–16)
BUN: 10 mg/dL (ref 7–18)
CALCIUM: 8.2 mg/dL — AB (ref 8.5–10.1)
CHLORIDE: 109 mmol/L — AB (ref 98–107)
CO2: 20 mmol/L — AB (ref 21–32)
CREATININE: 0.7 mg/dL (ref 0.60–1.30)
EGFR (African American): >60
Glucose: 97 mg/dL (ref 65–99)
Osmolality: 275 (ref 275–301)
POTASSIUM: 3.5 mmol/L (ref 3.5–5.1)
Sodium: 138 mmol/L (ref 136–145)

## 2013-11-11 LAB — CBC WITH DIFFERENTIAL/PLATELET
BASOS ABS: 0.1 10*3/uL (ref 0.0–0.1)
Basophil %: 1.2 %
Eosinophil #: 0.1 10*3/uL (ref 0.0–0.7)
Eosinophil %: 1.9 %
HCT: 40.2 % (ref 35.0–47.0)
HGB: 12.8 g/dL (ref 12.0–16.0)
Lymphocyte #: 3.7 10*3/uL — ABNORMAL HIGH (ref 1.0–3.6)
Lymphocyte %: 51.5 %
MCH: 25.4 pg — ABNORMAL LOW (ref 26.0–34.0)
MCHC: 31.9 g/dL — ABNORMAL LOW (ref 32.0–36.0)
MCV: 80 fL (ref 80–100)
MONO ABS: 0.5 x10 3/mm (ref 0.2–0.9)
Monocyte %: 6.8 %
Neutrophil #: 2.8 10*3/uL (ref 1.4–6.5)
Neutrophil %: 38.6 %
Platelet: 272 10*3/uL (ref 150–440)
RBC: 5.04 10*6/uL (ref 3.80–5.20)
RDW: 14.8 % — ABNORMAL HIGH (ref 11.5–14.5)
WBC: 7.2 10*3/uL (ref 3.6–11.0)

## 2013-11-11 LAB — HEMOGLOBIN A1C: Hemoglobin A1C: 14.6 % — ABNORMAL HIGH (ref 4.2–6.3)

## 2013-11-11 LAB — MAGNESIUM: MAGNESIUM: 1.5 mg/dL — AB

## 2013-11-12 ENCOUNTER — Encounter: Payer: Self-pay | Admitting: Internal Medicine

## 2013-11-12 LAB — BASIC METABOLIC PANEL
ANION GAP: 7 (ref 7–16)
BUN: 7 mg/dL (ref 7–18)
CALCIUM: 8.2 mg/dL — AB (ref 8.5–10.1)
CO2: 23 mmol/L (ref 21–32)
CREATININE: 0.59 mg/dL — AB (ref 0.60–1.30)
Chloride: 107 mmol/L (ref 98–107)
EGFR (African American): >60
EGFR (Non-African Amer.): >60
Glucose: 339 mg/dL — ABNORMAL HIGH (ref 65–99)
Osmolality: 285 (ref 275–301)
Potassium: 4.2 mmol/L (ref 3.5–5.1)
SODIUM: 137 mmol/L (ref 136–145)

## 2013-11-12 LAB — URINE CULTURE

## 2013-11-18 ENCOUNTER — Encounter (HOSPITAL_COMMUNITY): Payer: Self-pay | Admitting: Emergency Medicine

## 2013-11-18 ENCOUNTER — Emergency Department (HOSPITAL_COMMUNITY)
Admission: EM | Admit: 2013-11-18 | Discharge: 2013-11-19 | Discharge status: Against Medical Advice | Payer: Medicaid Other | Attending: Emergency Medicine | Admitting: Emergency Medicine

## 2013-11-18 DIAGNOSIS — Y838 Other surgical procedures as the cause of abnormal reaction of the patient, or of later complication, without mention of misadventure at the time of the procedure: Secondary | ICD-10-CM | POA: Insufficient documentation

## 2013-11-18 DIAGNOSIS — T819XXA Unspecified complication of procedure, initial encounter: Secondary | ICD-10-CM | POA: Insufficient documentation

## 2013-11-18 DIAGNOSIS — IMO0002 Reserved for concepts with insufficient information to code with codable children: Secondary | ICD-10-CM | POA: Insufficient documentation

## 2013-11-18 DIAGNOSIS — E119 Type 2 diabetes mellitus without complications: Secondary | ICD-10-CM | POA: Insufficient documentation

## 2013-11-18 NOTE — ED Notes (Signed)
No answer x2 

## 2013-11-18 NOTE — ED Notes (Signed)
No answer; pager and BP cuff found in waiting room.

## 2013-11-18 NOTE — ED Notes (Signed)
Pt in stating she had a rectal abscess drained during surgery two months ago, today she noted bleeding from the area, denies drainage or pain- states the blood was noted on a tissue, denies fever.

## 2013-11-20 NOTE — ED Provider Notes (Signed)
Patient left before being seen I was not able to examine this patient or perform any history or physical  Claudean Severance, MD 11/20/13 0249  PT not seen by MD.  Varney Biles, MD 11/20/13 [phone]

## 2013-11-21 ENCOUNTER — Emergency Department (HOSPITAL_COMMUNITY)
Admission: EM | Admit: 2013-11-21 | Discharge: 2013-11-21 | Discharge status: Against Medical Advice | Payer: Medicaid Other | Attending: Emergency Medicine | Admitting: Emergency Medicine

## 2013-11-21 ENCOUNTER — Emergency Department (HOSPITAL_COMMUNITY): Payer: Medicaid Other

## 2013-11-21 ENCOUNTER — Encounter (HOSPITAL_COMMUNITY): Payer: Self-pay | Admitting: Emergency Medicine

## 2013-11-21 DIAGNOSIS — Z8619 Personal history of other infectious and parasitic diseases: Secondary | ICD-10-CM | POA: Insufficient documentation

## 2013-11-21 DIAGNOSIS — Z880 Allergy status to penicillin: Secondary | ICD-10-CM | POA: Insufficient documentation

## 2013-11-21 DIAGNOSIS — Z792 Long term (current) use of antibiotics: Secondary | ICD-10-CM | POA: Insufficient documentation

## 2013-11-21 DIAGNOSIS — Z3202 Encounter for pregnancy test, result negative: Secondary | ICD-10-CM | POA: Insufficient documentation

## 2013-11-21 DIAGNOSIS — M5430 Sciatica, unspecified side: Secondary | ICD-10-CM | POA: Insufficient documentation

## 2013-11-21 DIAGNOSIS — Z79899 Other long term (current) drug therapy: Secondary | ICD-10-CM | POA: Insufficient documentation

## 2013-11-21 DIAGNOSIS — M545 Low back pain, unspecified: Secondary | ICD-10-CM | POA: Insufficient documentation

## 2013-11-21 DIAGNOSIS — M5441 Lumbago with sciatica, right side: Secondary | ICD-10-CM

## 2013-11-21 DIAGNOSIS — E1110 Type 2 diabetes mellitus with ketoacidosis without coma: Secondary | ICD-10-CM | POA: Insufficient documentation

## 2013-11-21 DIAGNOSIS — Z794 Long term (current) use of insulin: Secondary | ICD-10-CM | POA: Insufficient documentation

## 2013-11-21 LAB — URINALYSIS, ROUTINE W REFLEX MICROSCOPIC
Bilirubin Urine: NEGATIVE
Hgb urine dipstick: NEGATIVE
Ketones, ur: NEGATIVE mg/dL
LEUKOCYTES UA: NEGATIVE
NITRITE: NEGATIVE
Protein, ur: NEGATIVE mg/dL
SPECIFIC GRAVITY, URINE: 1.04 — AB (ref 1.005–1.030)
Urobilinogen, UA: 0.2 mg/dL (ref 0.0–1.0)
pH: 6 (ref 5.0–8.0)

## 2013-11-21 LAB — POC URINE PREG, ED: Preg Test, Ur: NEGATIVE

## 2013-11-21 LAB — URINE MICROSCOPIC-ADD ON

## 2013-11-21 MED ORDER — NAPROXEN 500 MG PO TABS: 500.0000 mg | ORAL_TABLET | Freq: Two times a day (BID) | ORAL | Status: DC

## 2013-11-21 MED ORDER — TRAMADOL HCL 50 MG PO TABS: 50.0000 mg | ORAL_TABLET | Freq: Four times a day (QID) | ORAL | Status: DC | PRN

## 2013-11-21 MED ORDER — SODIUM CHLORIDE 0.9 % IV BOLUS (SEPSIS): 1000.0000 mL | Freq: Once | INTRAVENOUS | Status: DC

## 2013-11-21 NOTE — ED Provider Notes (Signed)
Medical screening examination/treatment/procedure(s) were performed by non-physician practitioner and as supervising physician I was immediately available for consultation/collaboration.   EKG Interpretation None        Malvin Johns, MD 11/21/13 2345

## 2013-11-21 NOTE — ED Notes (Signed)
PT to BR to collect urine sample.

## 2013-11-21 NOTE — Discharge Instructions (Signed)
Take naprosyn for pain and inflammation. Take ultram for severe pain. Try heating pad. Try stretching and exercises. Follow up with your primary care doctor for recheck. Return if fever, numbness or weakness in your leg or foot, if have no control of your bladder or bowels.    Back Pain, Adult Back pain is very common. The pain often gets better over time. The cause of back pain is usually not dangerous. Most people can learn to manage their back pain on their own.  HOME CARE   Stay active. Start with short walks on flat ground if you can. Try to walk farther each day.  Do not sit, drive, or stand in one place for more than 30 minutes. Do not stay in bed.  Do not avoid exercise or work. Activity can help your back heal faster.  Be careful when you bend or lift an object. Bend at your knees, keep the object close to you, and do not twist.  Sleep on a firm mattress. Lie on your side, and bend your knees. If you lie on your back, put a pillow under your knees.  Only take medicines as told by your doctor.  Put ice on the injured area.  Put ice in a plastic bag.  Place a towel between your skin and the bag.  Leave the ice on for 15-20 minutes, 03-04 times a day for the first 2 to 3 days. After that, you can switch between ice and heat packs.  Ask your doctor about back exercises or massage.  Avoid feeling anxious or stressed. Find good ways to deal with stress, such as exercise. GET HELP RIGHT AWAY IF:   Your pain does not go away with rest or medicine.  Your pain does not go away in 1 week.  You have new problems.  You do not feel well.  The pain spreads into your legs.  You cannot control when you poop (bowel movement) or pee (urinate).  Your arms or legs feel weak or lose feeling (numbness).  You feel sick to your stomach (nauseous) or throw up (vomit).  You have belly (abdominal) pain.  You feel like you may pass out (faint). MAKE SURE YOU:   Understand these  instructions.  Will watch your condition.  Will get help right away if you are not doing well or get worse. Document Released: 10/03/2007 Document Revised: 07/09/2011 Document Reviewed: 08/18/2013 [facility] Patient Information 2015 [place], [place]. This information is not intended to replace advice given to you by your health care provider. Make sure you discuss any questions you have with your health care provider.   Back Exercises Back exercises help treat and prevent back injuries. The goal of back exercises is to increase the strength of your abdominal and back muscles and the flexibility of your back. These exercises should be started when you no longer have back pain. Back exercises include:  Pelvic Tilt. Lie on your back with your knees bent. Tilt your pelvis until the lower part of your back is against the floor. Hold this position 5 to 10 sec and repeat 5 to 10 times.  Knee to Chest. Pull first 1 knee up against your chest and hold for 20 to 30 seconds, repeat this with the other knee, and then both knees. This may be done with the other leg straight or bent, whichever feels better.  Sit-Ups or Curl-Ups. Bend your knees 90 degrees. Start with tilting your pelvis, and do a partial, slow sit-up, lifting your  trunk only 30 to 45 degrees off the floor. Take at least 2 to 3 seconds for each sit-up. Do not do sit-ups with your knees out straight. If partial sit-ups are difficult, simply do the above but with only tightening your abdominal muscles and holding it as directed.  Hip-Lift. Lie on your back with your knees flexed 90 degrees. Push down with your feet and shoulders as you raise your hips a couple inches off the floor; hold for 10 seconds, repeat 5 to 10 times.  Back arches. Lie on your stomach, propping yourself up on bent elbows. Slowly press on your hands, causing an arch in your low back. Repeat 3 to 5 times. Any initial stiffness and discomfort should lessen with repetition over  time.  Shoulder-Lifts. Lie face down with arms beside your body. Keep hips and torso pressed to floor as you slowly lift your head and shoulders off the floor. Do not overdo your exercises, especially in the beginning. Exercises may cause you some mild back discomfort which lasts for a few minutes; however, if the pain is more severe, or lasts for more than 15 minutes, do not continue exercises until you see your caregiver. Improvement with exercise therapy for back problems is slow.  See your caregivers for assistance with developing a proper back exercise program. Document Released: 05/24/2004 Document Revised: 07/09/2011 Document Reviewed: 02/15/2011 [facility] Patient Information 2015 [place], [place]. This information is not intended to replace advice given to you by your health care provider. Make sure you discuss any questions you have with your health care provider.

## 2013-11-21 NOTE — ED Notes (Signed)
Pt reports lower back pain x 2 days that radiates down right leg. Denies injury to back or urinary symptoms. Ambulatory at triage.

## 2013-11-21 NOTE — ED Provider Notes (Signed)
CSN: [id number]     Arrival date & time 11/21/13  1617 History  This chart was scribed for non-physician practitioner working with Malvin Johns, MD by Mercy Moore, ED Scribe. This patient was seen in room TR11C/TR11C and the patient's care was started at 5:16 PM.   Chief Complaint  Patient presents with  . Back Pain  . Leg Pain     The history is provided by the patient. No language interpreter was used.   HPI Comments: Ashley Freeman is a 23 y.o. female who presents to the Emergency Department complaining of constant lower back pain with radiation down her right leg, ongoing for three days. Patient reports that her pain initially presented two weeks ago, but resolved naturally. She reports recurrence of the pain for the last few days. Patient reports exacerbated pain with extended sitting and states that her pain is its worst when laying down. She reports intermittent tingling in her right leg, but denies weakness, bladder or bowel incontinence, or urinary symptoms. Patient denies known causative trauma or injury. Patient is ambulatory.  Patient denies possible pregnancy. Patient shares recent bladder infection for which just finished a course of antibiotics.   Patient is diabetic. States that her glucose level this morning was 78. She has not checked it since.   Past Medical History  Diagnosis Date  . Gonorrhea 08/2011    Treated in 09/2011  . Diabetes mellitus 2001    Diagnosed at age 15 ; Type I  . DKA (diabetic ketoacidoses) 08/19/2013   Past Surgical History  Procedure Laterality Date  . No past surgeries    . Incision and drainage perirectal abscess Right 08/18/2013    Procedure: IRRIGATION AND DEBRIDEMENT GLUTEAL ABSCESS;  Surgeon: Ralene Ok, MD;  Location: [place];  Service: General;  Laterality: Right;  . Incision and drainage perirectal abscess Right 09/19/2013    Procedure: IRRIGATION AND DEBRIDEMENT RIGHT GLUTEAL AND LABIAL ABSCESSES;  Surgeon: Ralene Ok,  MD;  Location: [place] [facility];  Service: General;  Laterality: Right;  . Incision and drainage perirectal abscess Right 09/24/2013    Procedure: IRRIGATION AND DEBRIDEMENT PERIRECTAL ABSCESS;  Surgeon: Gwenyth Ober, MD;  Location: [place];  Service: General;  Laterality: Right;   Family History  Problem Relation Age of Onset  . Anesthesia problems Neg Hx   . Asthma Mother   . Gout Father   . Diabetes Paternal Grandmother    History  Substance Use Topics  . Smoking status: Never Smoker   . Smokeless tobacco: Never Used  . Alcohol Use: No   OB History   Grav Para Term Preterm Abortions TAB SAB Ect Mult Living   1 0   1  1   0     Review of Systems  Constitutional: Negative for fever and chills.  Gastrointestinal: Negative for nausea, vomiting and abdominal pain.  Genitourinary: Negative for dysuria.  Musculoskeletal: Positive for arthralgias and back pain.  Neurological: Negative for weakness and numbness.      Allergies  Penicillins  Home Medications   Prior to Admission medications   Medication Sig Start Date End Date Taking? Authorizing Provider  clindamycin (CLEOCIN) 150 MG capsule Take 1 capsule (150 mg total) by mouth every 6 (six) hours. 09/19/13   Domenic Moras, PA-C  insulin aspart (NOVOLOG) 100 UNIT/ML injection Inject 6 Units into the skin 3 (three) times daily before meals. 09/29/13   Rebecca Eaton, MD  insulin glargine (LANTUS) 100 UNIT/ML injection Inject 0.35 mLs (35 Units total)  into the skin daily. 09/29/13   Rebecca Eaton, MD  oxyCODONE-acetaminophen (PERCOCET/ROXICET) 5-325 MG per tablet Take 2 tablets by mouth every 4 (four) hours as needed for severe pain. 09/19/13   Domenic Moras, PA-C  polyethylene glycol (MIRALAX / GLYCOLAX) packet Take 17 g by mouth 2 (two) times daily. 09/29/13   Rebecca Eaton, MD  sulfamethoxazole-trimethoprim (BACTRIM DS) 800-160 MG per tablet Take 1 tablet by mouth every 12 (twelve) hours. 09/29/13   Rebecca Eaton, MD   Triage Vitals: BP  122/78  Pulse 63  Temp(Src) 98 F (36.7 C) (Oral)  Resp 18  SpO2 98%  LMP 11/01/2013 Physical Exam  Nursing note and vitals reviewed. Constitutional: She is oriented to person, place, and time. She appears well-developed and well-nourished. No distress.  HENT:  Head: Normocephalic and atraumatic.  Eyes: EOM are normal.  Neck: Neck supple.  Cardiovascular: Normal rate.   Pulmonary/Chest: Effort normal. No respiratory distress.  Abdominal: There is no tenderness.  Lower right CVA tenderness.   Musculoskeletal: Normal range of motion.  No midline lumbar spine tenderness. Tender over right paraspinal muscles of the lower back. Pain with right straight leg raise.  Neurological: She is alert and oriented to person, place, and time.  5/5 and equal lower extremity strength. 2+ and equal patellar reflexes bilaterally. Pt able to dorsiflex bilateral toes and feet with good strength against resistance. Equal sensation bilaterally over thighs and lower legs.    Skin: Skin is warm and dry.  Psychiatric: She has a normal mood and affect. Her behavior is normal.    ED Course  Procedures (including critical care time) COORDINATION OF CARE: 5:16 PM- Discussed treatment plan with patient at bedside and patient agreed to plan.   Labs Review Labs Reviewed  URINALYSIS, ROUTINE W REFLEX MICROSCOPIC - Abnormal; Notable for the following:    APPearance CLOUDY (*)    Specific Gravity, Urine 1.040 (*)    Glucose, UA >1000 (*)    All other components within normal limits  URINE MICROSCOPIC-ADD ON - Abnormal; Notable for the following:    Squamous Epithelial / LPF FEW (*)    All other components within normal limits  URINE CULTURE  CBC WITH DIFFERENTIAL  BASIC METABOLIC PANEL  POC URINE PREG, ED  CBG MONITORING, ED    Imaging Review Dg Lumbar Spine Complete  11/21/2013   CLINICAL DATA:  Low back pain with radiation into the right leg for the past 2 weeks.  EXAM: LUMBAR SPINE - COMPLETE 4+  VIEW  COMPARISON:  No priors.  FINDINGS: There is no evidence of lumbar spine fracture. Alignment is normal. Intervertebral disc spaces are maintained.  IMPRESSION: Negative.   Electronically Signed   By: Vinnie Langton M.D.   On: 11/21/2013 18:01     EKG Interpretation None      MDM   Final diagnoses:  Right-sided low back pain with right-sided sciatica    Patient with right-sided back pain that intermittently radiates down right leg. No signs of cauda equina on today's exam. She is neurovascularly intact. She is ambulatory. She did have some CVA tenderness on the right side, recent UTI. Will check urine. Also will do a lumbar spine films given radicular symptoms.   Urine analysis unremarkable other than elevated glucose which appears to be a baseline for this patient given her poorly controlled diabetes. At this time today she is does not appear to be in DKA, she has normal vital signs, no ketones in urine. No other complaints other  than back pain. She's afebrile. Urinalysis does not show any signs of infection. X-rays negative. Will discharge home on Ultram and ibuprofen, with close followup with primary care Dr. Discussed symptoms that should bring her back to emergency department  Filed Vitals:   11/21/13 1636  BP: 122/78  Pulse: 63  Temp: 98 F (36.7 C)  Resp: 18    7:02 PM Pt's VS repeated. HR now in 120s. Pt stats she still feels "fine." States she is "ready to go home." Pt states she is due for her insulin now. Pt's VS are normal otherwise, she is afebrile. Ordered fluids and labs on PT, however, pt refused. Her urine did not show ketones, dont think she is in DKA. Pt stated that "I got mad at someone on the phone that's why my heart rate is high." I spent several minutes trying to talk her into labs however she refused understanding that her condition may worsen. She left ama.   Filed Vitals:   11/21/13 1636 11/21/13 1840 11/21/13 1854  BP: 122/78 120/89   Pulse: 63 127  123  Temp: 98 F (36.7 C) 98.8 F (37.1 C)   TempSrc: Oral Oral   Resp: 18 16   SpO2: 98% 99%     I personally performed the services described in this documentation, which was scribed in my presence. The recorded information has been reviewed and is accurate.    Renold Genta, PA-C 11/21/13 1829  Renold Genta, PA-C 11/21/13 1904

## 2013-11-21 NOTE — ED Notes (Signed)
Pt refusing IV and blood work.  CBG 425.  PA in room, pt wanting to go home.  Pt to sign AMA and verbalizes understanding to go home take meds and if worse come back or call 911.

## 2013-11-23 LAB — URINE CULTURE

## 2013-11-23 LAB — CBG MONITORING, ED: Glucose-Capillary: 425 mg/dL — ABNORMAL HIGH (ref 70–99)

## 2013-11-25 ENCOUNTER — Ambulatory Visit: Payer: Self-pay | Admitting: Internal Medicine

## 2013-12-26 ENCOUNTER — Encounter (HOSPITAL_COMMUNITY): Payer: Self-pay | Admitting: Emergency Medicine

## 2013-12-26 ENCOUNTER — Inpatient Hospital Stay (HOSPITAL_COMMUNITY)
Admission: EM | Admit: 2013-12-26 | Discharge: 2013-12-28 | DRG: 639 | Disposition: A | Discharge status: Home / Self Care | Payer: Medicaid Other | Attending: Internal Medicine | Admitting: Internal Medicine

## 2013-12-26 ENCOUNTER — Emergency Department (HOSPITAL_COMMUNITY): Payer: Medicaid Other

## 2013-12-26 DIAGNOSIS — E108 Type 1 diabetes mellitus with unspecified complications: Secondary | ICD-10-CM

## 2013-12-26 DIAGNOSIS — Z9114 Patient's other noncompliance with medication regimen: Secondary | ICD-10-CM

## 2013-12-26 DIAGNOSIS — E1065 Type 1 diabetes mellitus with hyperglycemia: Secondary | ICD-10-CM | POA: Diagnosis present

## 2013-12-26 DIAGNOSIS — R05 Cough: Secondary | ICD-10-CM

## 2013-12-26 DIAGNOSIS — R739 Hyperglycemia, unspecified: Secondary | ICD-10-CM

## 2013-12-26 DIAGNOSIS — Z794 Long term (current) use of insulin: Secondary | ICD-10-CM

## 2013-12-26 DIAGNOSIS — E1010 Type 1 diabetes mellitus with ketoacidosis without coma: Principal | ICD-10-CM | POA: Diagnosis present

## 2013-12-26 DIAGNOSIS — R059 Cough, unspecified: Secondary | ICD-10-CM

## 2013-12-26 DIAGNOSIS — E860 Dehydration: Secondary | ICD-10-CM | POA: Diagnosis present

## 2013-12-26 DIAGNOSIS — N390 Urinary tract infection, site not specified: Secondary | ICD-10-CM

## 2013-12-26 DIAGNOSIS — R197 Diarrhea, unspecified: Secondary | ICD-10-CM | POA: Diagnosis present

## 2013-12-26 DIAGNOSIS — J069 Acute upper respiratory infection, unspecified: Secondary | ICD-10-CM | POA: Diagnosis present

## 2013-12-26 LAB — I-STAT VENOUS BLOOD GAS, ED
Acid-base deficit: 2 mmol/L (ref 0.0–2.0)
Bicarbonate: 23.9 mEq/L (ref 20.0–24.0)
O2 Saturation: 35 %
PCO2 VEN: 45.6 mmHg (ref 45.0–50.0)
PO2 VEN: 23 mmHg — AB (ref 30.0–45.0)
Patient temperature: 98.2
TCO2: 25 mmol/L (ref 0–100)
pH, Ven: 7.326 — ABNORMAL HIGH (ref 7.250–7.300)

## 2013-12-26 LAB — CBC WITH DIFFERENTIAL/PLATELET
Basophils Absolute: 0 10*3/uL (ref 0.0–0.1)
Basophils Relative: 1 % (ref 0–1)
EOS PCT: 2 % (ref 0–5)
Eosinophils Absolute: 0.1 10*3/uL (ref 0.0–0.7)
HCT: 38.3 % (ref 36.0–46.0)
Hemoglobin: 13.6 g/dL (ref 12.0–15.0)
LYMPHS ABS: 2.8 10*3/uL (ref 0.7–4.0)
LYMPHS PCT: 44 % (ref 12–46)
MCH: 26.1 pg (ref 26.0–34.0)
MCHC: 35.5 g/dL (ref 30.0–36.0)
MCV: 73.5 fL — AB (ref 78.0–100.0)
MONO ABS: 0.5 10*3/uL (ref 0.1–1.0)
Monocytes Relative: 8 % (ref 3–12)
Neutro Abs: 2.8 10*3/uL (ref 1.7–7.7)
Neutrophils Relative %: 45 % (ref 43–77)
Platelets: 337 10*3/uL (ref 150–400)
RBC: 5.21 MIL/uL — AB (ref 3.87–5.11)
RDW: 15.2 % (ref 11.5–15.5)
WBC: 6.2 10*3/uL (ref 4.0–10.5)

## 2013-12-26 LAB — BASIC METABOLIC PANEL
ANION GAP: 12 (ref 5–15)
Anion gap: 18 — ABNORMAL HIGH (ref 5–15)
Anion gap: 13 (ref 5–15)
BUN: 13 mg/dL (ref 6–23)
BUN: 11 mg/dL (ref 6–23)
BUN: 9 mg/dL (ref 6–23)
CALCIUM: 9.6 mg/dL (ref 8.4–10.5)
CALCIUM: 8.5 mg/dL (ref 8.4–10.5)
CALCIUM: 8.1 mg/dL — AB (ref 8.4–10.5)
CHLORIDE: 102 meq/L (ref 96–112)
CHLORIDE: 103 meq/L (ref 96–112)
CO2: 18 meq/L — AB (ref 19–32)
CO2: 21 mEq/L (ref 19–32)
CO2: 20 meq/L (ref 19–32)
Chloride: 94 mEq/L — ABNORMAL LOW (ref 96–112)
Creatinine, Ser: 0.44 mg/dL — ABNORMAL LOW (ref 0.50–1.10)
Creatinine, Ser: 0.44 mg/dL — ABNORMAL LOW (ref 0.50–1.10)
Creatinine, Ser: 0.39 mg/dL — ABNORMAL LOW (ref 0.50–1.10)
GFR calc Af Amer: >90 mL/min (ref >90)
GFR calc non Af Amer: >90 mL/min (ref >90)
GFR calc non Af Amer: >90 mL/min (ref >90)
GLUCOSE: 317 mg/dL — AB (ref 70–99)
Glucose, Bld: 180 mg/dL — ABNORMAL HIGH (ref 70–99)
Glucose, Bld: 188 mg/dL — ABNORMAL HIGH (ref 70–99)
Potassium: 4.2 mEq/L (ref 3.7–5.3)
Potassium: 3.6 mEq/L — ABNORMAL LOW (ref 3.7–5.3)
Potassium: 3.7 mEq/L (ref 3.7–5.3)
SODIUM: 136 meq/L — AB (ref 137–147)
Sodium: 130 mEq/L — ABNORMAL LOW (ref 137–147)
Sodium: 135 mEq/L — ABNORMAL LOW (ref 137–147)

## 2013-12-26 LAB — URINALYSIS, ROUTINE W REFLEX MICROSCOPIC
BILIRUBIN URINE: NEGATIVE
HGB URINE DIPSTICK: NEGATIVE
KETONES UR: 15 mg/dL — AB
Leukocytes, UA: NEGATIVE
Nitrite: NEGATIVE
Protein, ur: 30 mg/dL — AB
Specific Gravity, Urine: 1.045 — ABNORMAL HIGH (ref 1.005–1.030)
Urobilinogen, UA: 0.2 mg/dL (ref 0.0–1.0)
pH: 6 (ref 5.0–8.0)

## 2013-12-26 LAB — MRSA PCR SCREENING: MRSA by PCR: NEGATIVE

## 2013-12-26 LAB — URINE MICROSCOPIC-ADD ON

## 2013-12-26 LAB — CBG MONITORING, ED
Glucose-Capillary: 363 mg/dL — ABNORMAL HIGH (ref 70–99)
Glucose-Capillary: 226 mg/dL — ABNORMAL HIGH (ref 70–99)

## 2013-12-26 LAB — PREGNANCY, URINE: Preg Test, Ur: NEGATIVE

## 2013-12-26 LAB — GLUCOSE, CAPILLARY
GLUCOSE-CAPILLARY: 190 mg/dL — AB (ref 70–99)
Glucose-Capillary: 133 mg/dL — ABNORMAL HIGH (ref 70–99)
Glucose-Capillary: 92 mg/dL (ref 70–99)
Glucose-Capillary: 242 mg/dL — ABNORMAL HIGH (ref 70–99)

## 2013-12-26 MED ORDER — DEXTROSE-NACL 5-0.45 % IV SOLN
INTRAVENOUS | Status: DC
  Administered 2013-12-26: 17:00:00 via INTRAVENOUS

## 2013-12-26 MED ORDER — INSULIN ASPART 100 UNIT/ML ~~LOC~~ SOLN: 10.0000 [IU] | Freq: Once | SUBCUTANEOUS | Status: DC

## 2013-12-26 MED ORDER — SODIUM CHLORIDE 0.9 % IV SOLN: INTRAVENOUS | Status: DC

## 2013-12-26 MED ORDER — DEXTROSE 50 % IV SOLN: 25.0000 mL | INTRAVENOUS | Status: DC | PRN

## 2013-12-26 MED ORDER — SODIUM CHLORIDE 0.9 % IV SOLN
INTRAVENOUS | Status: AC
  Administered 2013-12-26: 1000 mL via INTRAVENOUS
  Administered 2013-12-27: 07:00:00 via INTRAVENOUS

## 2013-12-26 MED ORDER — SODIUM CHLORIDE 0.9 % IV SOLN
INTRAVENOUS | Status: AC
  Administered 2013-12-26: 18:00:00 via INTRAVENOUS

## 2013-12-26 MED ORDER — POTASSIUM CHLORIDE CRYS ER 20 MEQ PO TBCR
40.0000 meq | EXTENDED_RELEASE_TABLET | Freq: Two times a day (BID) | ORAL | Status: DC
  Administered 2013-12-26 – 2013-12-28 (×4): 40 meq via ORAL
  Filled 2013-12-26 (×7): qty 2

## 2013-12-26 MED ORDER — INSULIN ASPART 100 UNIT/ML ~~LOC~~ SOLN: 0.0000 [IU] | Freq: Three times a day (TID) | SUBCUTANEOUS | Status: DC

## 2013-12-26 MED ORDER — INSULIN DETEMIR 100 UNIT/ML ~~LOC~~ SOLN
20.0000 [IU] | SUBCUTANEOUS | Status: DC
  Administered 2013-12-26 – 2013-12-27 (×2): 20 [IU] via SUBCUTANEOUS
  Filled 2013-12-26 (×4): qty 0.2

## 2013-12-26 MED ORDER — INSULIN REGULAR HUMAN 100 UNIT/ML IJ SOLN
INTRAMUSCULAR | Status: DC
  Administered 2013-12-26: 1.7 [IU]/h via INTRAVENOUS
  Filled 2013-12-26: qty 2.5

## 2013-12-26 MED ORDER — DEXTROSE-NACL 5-0.45 % IV SOLN
INTRAVENOUS | Status: DC
  Administered 2013-12-26: 999 mL via INTRAVENOUS

## 2013-12-26 MED ORDER — POTASSIUM CHLORIDE 10 MEQ/100ML IV SOLN
10.0000 meq | INTRAVENOUS | Status: AC
  Administered 2013-12-26: 10 meq via INTRAVENOUS
  Filled 2013-12-26: qty 100

## 2013-12-26 MED ORDER — HEPARIN SODIUM (PORCINE) 5000 UNIT/ML IJ SOLN
5000.0000 [IU] | Freq: Three times a day (TID) | INTRAMUSCULAR | Status: DC
  Administered 2013-12-27 – 2013-12-28 (×3): 5000 [IU] via SUBCUTANEOUS
  Filled 2013-12-26 (×9): qty 1

## 2013-12-26 MED ORDER — SODIUM CHLORIDE 0.9 % IV BOLUS (SEPSIS)
1000.0000 mL | Freq: Once | INTRAVENOUS | Status: AC
  Administered 2013-12-26: 1000 mL via INTRAVENOUS

## 2013-12-26 MED ORDER — SODIUM CHLORIDE 0.9 % IV SOLN
INTRAVENOUS | Status: DC
  Filled 2013-12-26: qty 2.5

## 2013-12-26 MED ORDER — DEXTROSE 5 % IV SOLN
1.0000 g | Freq: Once | INTRAVENOUS | Status: AC
  Administered 2013-12-26: 1 g via INTRAVENOUS
  Filled 2013-12-26: qty 10

## 2013-12-26 MED ORDER — INSULIN ASPART 100 UNIT/ML ~~LOC~~ SOLN
0.0000 [IU] | SUBCUTANEOUS | Status: DC
  Administered 2013-12-26 – 2013-12-27 (×2): 2 [IU] via SUBCUTANEOUS
  Administered 2013-12-27: 1 [IU] via SUBCUTANEOUS
  Administered 2013-12-27: 3 [IU] via SUBCUTANEOUS
  Administered 2013-12-28: 5 [IU] via SUBCUTANEOUS
  Administered 2013-12-28: 3 [IU] via SUBCUTANEOUS

## 2013-12-26 NOTE — H&P (Signed)
Date: 12/26/2013               Patient Name:  Ashley Freeman MRN: [id number]  DOB: Sep 02, 1990 Age / Sex: 23 y.o., female   PCP: Cresenciano Genre, MD              Medical Service: Internal Medicine Teaching Service              Attending Physician: Dr. Bartholomew Crews, MD    First Contact: Darcus Austin, [place] [address] Pager: [phone]  Second Contact: Dr. Karle Starch Moding Pager: [phone]  Third Contact Dr. Jessee Avers Pager: [phone]       After Hours (After 5p/  First Contact Pager: [phone]  weekends / holidays): Second Contact Pager: [phone]   Chief Complaint: "I'm not feeling well and had diarrhea yesterday."  History of Present Illness: Ashley Freeman is a 23 YO woman with a PMH of DM1 who presents to the ED with 2 days of dry cough and fever. She had 1 episode of diarrhea yesterday and has been unable to eat anything all day. She denies any nausea, vomiting, fatigue, or lightheadedness. She denies rhinorrhea, chills, headache, abdominal pain, vaginal discharge, hematuria, dysuria, and hematochezia. This is her 5th presentation to the ED for DKA in the past 6 months. She states that she doesn't feel as bad today as she did with her previous episodes of DKA.  Patient was diagnosed with DKA at age 71 and sees Dr. Aundra Dubin as her PCP. Her home insulin regimen is levemir 35 units in the morning and aspart 6 U approx 2x daily when she eats or "feels like she's having a sugar high." Patient reports that she checks her glucose level at home.  Meds: Current Facility-Administered Medications  Medication Dose Route Frequency Provider Last Rate Last Dose  . dextrose 5 %-0.45 % sodium chloride infusion   Intravenous Continuous Jennifer L Piepenbrink, PA-C      . insulin regular (NOVOLIN R,HUMULIN R) 250 Units in sodium chloride 0.9 % 250 mL (1 Units/mL) infusion   Intravenous Continuous Stephani Police Piepenbrink, PA-C       Current Outpatient Prescriptions  Medication Sig Dispense Refill  .  insulin aspart (NOVOLOG FLEXPEN) 100 UNIT/ML FlexPen Inject 6 Units into the skin 2 (two) times daily.      . insulin detemir (LEVEMIR) 100 UNIT/ML injection Inject 35 Units into the skin every morning.        Allergies: Allergies as of 12/26/2013 - Review Complete 12/26/2013  Allergen Reaction Noted  . Penicillins Hives 11/11/2010   Past Medical History  Diagnosis Date  . Gonorrhea 08/2011    Treated in 09/2011  . Diabetes mellitus 2001    Diagnosed at age 64 ; Type I  . DKA (diabetic ketoacidoses) 08/19/2013   Past Surgical History  Procedure Laterality Date  . No past surgeries    . Incision and drainage perirectal abscess Right 08/18/2013    Procedure: IRRIGATION AND DEBRIDEMENT GLUTEAL ABSCESS;  Surgeon: Ralene Ok, MD;  Location: [facility];  Service: General;  Laterality: Right;  . Incision and drainage perirectal abscess Right 09/19/2013    Procedure: IRRIGATION AND DEBRIDEMENT RIGHT GLUTEAL AND LABIAL ABSCESSES;  Surgeon: Ralene Ok, MD;  Location: [facility];  Service: General;  Laterality: Right;  . Incision and drainage perirectal abscess Right 09/24/2013    Procedure: IRRIGATION AND DEBRIDEMENT PERIRECTAL ABSCESS;  Surgeon: Gwenyth Ober, MD;  Location: [facility];  Service: General;  Laterality: Right;   Family History  Problem Relation Age of Onset  . Anesthesia problems Neg Hx   . Asthma Mother   . Gout Father   . Diabetes Paternal Grandmother    History   Social History  . Marital Status: Single    Spouse Name: N/A    Number of Children: 0  . Years of Education: 11th grade   Occupational History  . unemployed     has never worked   Social History Main Topics  . Smoking status: Never Smoker   . Smokeless tobacco: Never Used  . Alcohol Use: No  . Drug Use: No  . Sexual Activity: Yes    Birth Control/ Protection: None     Comment: As of 11/06/11 1 partner (female) not using protection   Other Topics Concern  . Not on file   Social History Narrative   Patient  lives in [place] mother lives in [place].  Unemployed.  Previously worked for a IT consultant.  Completed 11 grade working on [employer]. Patient 3 brothers     Review of Systems: Constitutional: positive for fevers and malaise, negative for chills and fatigue Eyes: negative for visual changes Respiratory: negative for dyspnea on exertion, pleurisy/chest pain and sputum Cardiovascular: negative for chest pain, dyspnea, fatigue and syncope Gastrointestinal: positive for 1 episode of diarrhea, negative for abdominal pain, melena, nausea and vomiting Genitourinary: negative for vaginal discharge, dysuria, frequency and hematuria Musculoskeletal: negative for muscle weakness and myalgias  Physical Exam: Blood pressure 119/82, pulse 95, temperature 98.2 F (36.8 C), temperature source Oral, resp. rate 18, height 5\' 4"  (1.626 m), weight 56.246 kg (124 lb), last menstrual period 11/30/2013, SpO2 100.00%. General appearance: alert, cooperative, appears stated age and no distress Head: Normocephalic, without obvious abnormality, atraumatic Back: symmetric, no curvature. ROM normal. No CVA tenderness. Lungs: clear to auscultation bilaterally Heart: regular rate and rhythm, S1, S2 normal, no murmur, click, rub or gallop Abdomen: soft, non-tender; bowel sounds normal; no masses,  no organomegaly Extremities: extremities normal, atraumatic, no cyanosis or edema Pulses: 2+ and symmetric Neurologic: Grossly normal  Lab results: Basic Metabolic Panel:  Recent Labs  12/26/13 1346  NA 130*  K 4.2  CL 94*  CO2 18*  GLUCOSE 317*  BUN 13  CREATININE 0.44*  CALCIUM 9.6   CBC:  Recent Labs  12/26/13 1346  WBC 6.2  NEUTROABS 2.8  HGB 13.6  HCT 38.3  MCV 73.5*  PLT 337   CBG:  Recent Labs  12/26/13 1340 12/26/13 1558  GLUCAP 363* 226*   Urine Drug Screen: Drugs of Abuse     Component Value Date/Time   LABOPIA NONE DETECTED 08/09/2013 0005   LABOPIA NEGATIVE 12/18/2009 1453    COCAINSCRNUR NONE DETECTED 08/09/2013 0005   COCAINSCRNUR NEGATIVE 12/18/2009 1453   LABBENZ NONE DETECTED 08/09/2013 0005   LABBENZ NEGATIVE 12/18/2009 1453   AMPHETMU NONE DETECTED 08/09/2013 0005   AMPHETMU NEGATIVE 12/18/2009 1453   THCU NONE DETECTED 08/09/2013 0005   LABBARB NONE DETECTED 08/09/2013 0005    Urinalysis:  Recent Labs  12/26/13 1547  COLORURINE YELLOW  LABSPEC 1.045*  PHURINE 6.0  GLUCOSEU >1000*  HGBUR NEGATIVE  BILIRUBINUR NEGATIVE  KETONESUR 15*  PROTEINUR 30*  UROBILINOGEN 0.2  NITRITE NEGATIVE  LEUKOCYTESUR NEGATIVE   Misc. Labs: UPT negative  Imaging results:  Dg Chest 2 View  12/26/2013   CLINICAL DATA:  Cough  EXAM: CHEST  2 VIEW  COMPARISON:  09/24/2013  FINDINGS: The heart size and mediastinal contours are within normal limits. Both  lungs are clear. The visualized skeletal structures are unremarkable.  IMPRESSION: No active cardiopulmonary disease.   Electronically Signed   By: Inez Catalina M.D.   On: 12/26/2013 14:49    Assessment & Plan by Problem: Active Problems:   Diabetes mellitus type 1 (uncontrolled)   DKA (diabetic ketoacidoses)  DKA in uncontrolled type 1 DM --AG 18 on admission with bicarb of 18. ABG at 1642 with pH 7.33, pCO2 45.6, pO2 23.0, Bicarb 23.9. Reports that she hasn't been feeling well for the past 2 days and her appetite has greatly decreased, affecting her use of insulin. She states that she uses her aspart occasionally when she eats or when she feels "a sugar high." Last HbA1C was 19.3 in April 2015. HbA1c levels have been greater than 15 for the past 2 years. She denies peripheral neuropathy or vision changes. Patient reports that her PCP is Dr. Aundra Dubin - needs better outpatient follow up.  -admit to SDU  -Received 3L NS bolus -D5 at 125/hr continuous infusion once CBG <250 -bmet's q2h, replace electrolytes as needed  -CBG monitoring  -Insulin regular in NaCl 0.9% 250 mL IV infusion -Dextrose 50% 25 mL IV prn for CBG  <70 -transition to [place] insulin when AG closed  -KCl 10 mEq in 100 mL IV per hour for 2 hours -NPO, look to advance diet once AG closed -Consult diabetic educator to speak with patient before discharge  UA results: Patient's urine culture was positive for ketones and glucose. No dysuria, urinary frequency, hematuria or vaginal discharge reported. -Patient was given ceftriaxone 1 g in D 5% 50 mL IV in the ED -Discontinued after 1 dose   History of incision and drainage of perirectal abscess: I&D of perirectal abscess in April and May of 2015. Patient reports that abscess healed well and has no acute complaints.  This is a Careers information officer Note.  The care of the patient was discussed with Dr. Trudee Kuster and the assessment and plan was formulated with their assistance.  Please see their note for official documentation of the patient encounter.   Signed: Darcus Austin, Med Student 12/26/2013, 5:10 PM

## 2013-12-26 NOTE — ED Notes (Signed)
Reports 2 days of dry cough and fever, diarrhea starting last night, glucometer at home read "high" last night, not checked today. Feeling weak and lightheaded. Denies N/V.

## 2013-12-26 NOTE — Progress Notes (Deleted)
Date: 12/26/2013               Patient Name:  Ashley Freeman MRN: [id number]  DOB: Jan 26, 1991 Age / Sex: 23 y.o., female   PCP: Annett Gula, MD              Medical Service: Internal Medicine Teaching Service              Attending Physician: Dr. Burns Spain, MD    First Contact: Dr. Mitzie Na Drina Jobst Pager: [phone]  Second Contact: Dr. Dow Adolph Pager: [phone]            After Hours (After 5p/  First Contact Pager: [phone]  weekends / holidays): Second Contact Pager: [phone]   Chief Complaint:  Hyperglycemia  History of Present Illness: Ashley Freeman is a 23 year old woman with PMH of type 1 diabetes mellitus presenting with hyperglycemia.  She reports that her glucose levels have been rising over the last three days to the 400s to not readable on her monitor despite taking her insulin.  She has had a non-productive cough for the last two days, and reports one episode of non-bloody diarrhea yesterday with increased fatigue and lightheadedness.  She has been unable to eat anything all day.  She was last admitted for DKA on 09/19/13.  She denies fevers, chills, nausea, or emesis.  Review of Systems: Review of Systems  Constitutional: Positive for malaise/fatigue. Negative for fever, chills and diaphoresis.  HENT: Negative for congestion.   Eyes: Negative for blurred vision.  Respiratory: Positive for cough. Negative for hemoptysis, sputum production, shortness of breath and wheezing.   Cardiovascular: Negative for chest pain, palpitations and leg swelling.  Gastrointestinal: Positive for diarrhea. Negative for vomiting, abdominal pain, constipation and blood in stool.  Genitourinary: Negative for dysuria, urgency, frequency and flank pain.  Musculoskeletal: Negative for joint pain and myalgias.  Neurological: Negative for dizziness, sensory change, focal weakness and headaches.  Psychiatric/Behavioral: Negative for depression and suicidal ideas.     Meds: Medications Prior to Admission  Medication Sig Dispense Refill  . insulin aspart (NOVOLOG FLEXPEN) 100 UNIT/ML FlexPen Inject 6 Units into the skin 2 (two) times daily.      . insulin detemir (LEVEMIR) 100 UNIT/ML injection Inject 35 Units into the skin every morning.       Current Facility-Administered Medications  Medication Dose Route Frequency Provider Last Rate Last Dose  . 0.9 %  sodium chloride infusion   Intravenous Continuous Dow Adolph, MD 999 mL/hr at 12/26/13 1822    . 0.9 %  sodium chloride infusion   Intravenous Continuous Dow Adolph, MD      . dextrose 5 %-0.45 % sodium chloride infusion   Intravenous Continuous Dow Adolph, MD 125 mL/hr at 12/26/13 1754 999 mL at 12/26/13 1754  . dextrose 50 % solution 25 mL  25 mL Intravenous PRN Dow Adolph, MD      . heparin injection 5,000 Units  5,000 Units Subcutaneous 3 times per day Dow Adolph, MD      . insulin regular (NOVOLIN R,HUMULIN R) 250 Units in sodium chloride 0.9 % 250 mL (1 Units/mL) infusion   Intravenous Continuous Dow Adolph, MD 1.7 mL/hr at 12/26/13 1805 1.7 Units/hr at 12/26/13 1805  . potassium chloride 10 mEq in 100 mL IVPB  10 mEq Intravenous Q1H Dow Adolph, MD   10 mEq at 12/26/13 1828    Allergies: Allergies as of 12/26/2013 - Review Complete 12/26/2013  Allergen Reaction  Noted  . Penicillins Hives 11/11/2010   Past Medical History  Diagnosis Date  . Gonorrhea 08/2011    Treated in 09/2011  . Diabetes mellitus 2001    Diagnosed at age 68 ; Type I  . DKA (diabetic ketoacidoses) 08/19/2013   Past Surgical History  Procedure Laterality Date  . No past surgeries    . Incision and drainage perirectal abscess Right 08/18/2013    Procedure: IRRIGATION AND DEBRIDEMENT GLUTEAL ABSCESS;  Surgeon: Axel Filler, MD;  Location: [facility] OR;  Service: General;  Laterality: Right;  . Incision and drainage perirectal abscess Right 09/19/2013    Procedure: IRRIGATION AND DEBRIDEMENT  RIGHT GLUTEAL AND LABIAL ABSCESSES;  Surgeon: Axel Filler, MD;  Location: [facility] OR;  Service: General;  Laterality: Right;  . Incision and drainage perirectal abscess Right 09/24/2013    Procedure: IRRIGATION AND DEBRIDEMENT PERIRECTAL ABSCESS;  Surgeon: Cherylynn Ridges, MD;  Location: [facility] OR;  Service: General;  Laterality: Right;   Family History  Problem Relation Age of Onset  . Anesthesia problems Neg Hx   . Asthma Mother   . Gout Father   . Diabetes Paternal Grandmother    History   Social History  . Marital Status: Single    Spouse Name: N/A    Number of Children: 0  . Years of Education: 11th grade   Occupational History  . unemployed     has never worked   Social History Main Topics  . Smoking status: Never Smoker   . Smokeless tobacco: Never Used  . Alcohol Use: No  . Drug Use: No  . Sexual Activity: Yes    Birth Control/ Protection: None     Comment: As of 11/06/11 1 partner (female) not using protection   Other Topics Concern  . Not on file   Social History Narrative   Patient lives in [place] mother lives in [place].  Unemployed.  Previously worked for a Customer service manager.  Completed 11 grade working on [employer]. Patient 3 brothers     Physical Exam: Filed Vitals:   12/26/13 1800  BP: 139/95  Pulse: 99  Temp:   Resp: 23   Physical Exam  Constitutional: She is oriented to person, place, and time and well-developed, well-nourished, and in no distress. No distress.  HENT:  Head: Normocephalic and atraumatic.  Eyes: Conjunctivae and EOM are normal. Pupils are equal, round, and reactive to light. No scleral icterus.  Neck: Normal range of motion.  Cardiovascular: Normal rate and regular rhythm.   Pulmonary/Chest: Effort normal and breath sounds normal.  Abdominal: Soft. Bowel sounds are normal. She exhibits no distension. There is no tenderness.  Musculoskeletal: Normal range of motion. She exhibits no edema.  Neurological: She is alert and oriented to  person, place, and time. No cranial nerve deficit.  Skin: Skin is warm and dry. She is not diaphoretic.  Psychiatric: Affect normal.    Lab results: Basic Metabolic Panel:  Recent Labs  16/10/96 1346 12/26/13 1703  NA 130* 136*  K 4.2 3.6*  CL 94* 102  CO2 18* 21  GLUCOSE 317* 180*  BUN 13 11  CREATININE 0.44* 0.44*  CALCIUM 9.6 8.5   CBC:  Recent Labs  12/26/13 1346  WBC 6.2  NEUTROABS 2.8  HGB 13.6  HCT 38.3  MCV 73.5*  PLT 337   CBG:  Recent Labs  12/26/13 1340 12/26/13 1558 12/26/13 1804  GLUCAP 363* 226* 133*   Urine Drug Screen: Drugs of Abuse     Component  Value Date/Time   LABOPIA NONE DETECTED 08/09/2013 0005   LABOPIA NEGATIVE 12/18/2009 1453   COCAINSCRNUR NONE DETECTED 08/09/2013 0005   COCAINSCRNUR NEGATIVE 12/18/2009 1453   LABBENZ NONE DETECTED 08/09/2013 0005   LABBENZ NEGATIVE 12/18/2009 1453   AMPHETMU NONE DETECTED 08/09/2013 0005   AMPHETMU NEGATIVE 12/18/2009 1453   THCU NONE DETECTED 08/09/2013 0005   LABBARB NONE DETECTED 08/09/2013 0005    Urinalysis:  Recent Labs  12/26/13 1547  COLORURINE YELLOW  LABSPEC 1.045*  PHURINE 6.0  GLUCOSEU >1000*  HGBUR NEGATIVE  BILIRUBINUR NEGATIVE  KETONESUR 15*  PROTEINUR 30*  UROBILINOGEN 0.2  NITRITE NEGATIVE  LEUKOCYTESUR NEGATIVE   ABG    Component Value Date/Time   PHART 7.162* 08/16/2013 2245   PCO2ART 7.3* 08/16/2013 2245   PO2ART 131.0* 08/16/2013 2245   HCO3 23.9 12/26/2013 1642   TCO2 25 12/26/2013 1642   ACIDBASEDEF 2.0 12/26/2013 1642   O2SAT 35.0 12/26/2013 1642    Imaging results:  Dg Chest 2 View  12/26/2013   CLINICAL DATA:  Cough  EXAM: CHEST  2 VIEW  COMPARISON:  09/24/2013  FINDINGS: The heart size and mediastinal contours are within normal limits. Both lungs are clear. The visualized skeletal structures are unremarkable.  IMPRESSION: No active cardiopulmonary disease.   Electronically Signed   By: Alcide Clever M.D.   On: 12/26/2013 14:49   Assessment & Plan by  Problem: Principal Problem:   DKA (diabetic ketoacidoses) Active Problems:   Diabetes mellitus type 1 (uncontrolled)   #Type 1 diabetes mellitus with DKA Anion gap of 18 on admission with bicarb of 19, pH 7.33.  Symptoms consistent with URI, possible viral diarrhea though only one episode.  Reports good compliance, but does not check her sugars and takes Novolog when she thinks her sugar is high, which she reports is approximately two times per day.  Last A1c 19.3 in 07/2013.  This episode is likely due to noncompliance with a possible contribution of viral infection.  She was given ceftriaxone for UTI in the ED, but she has not symptoms and her urinalysis is not consistent with UTI.  Received 2 L NS in ER.  Last CBG 133. -Admit to step down unit. -Insulin gtt. -Repeat BMP now the q2h. -Switch to D5 1/2 NS at 125 ml/hr. -10 mEq KCl in 100 ml x2 -NPO until anion gap closed. -Diabetes coordinator consult.  #Cough Most consistent with URI.  Chest x-ray unremarkable. -Supportive care.  #Diarrhea One episode yesterday.  Previously received antibiotics in 08/2012 for perirectal abscess that is now healed. Possibly viral.  Only one episode, so unlikely to be cdiff.  Will have low threshold to order testing due to antibiotic use and hyperglycemia. -Rehydration as above.  #DVT prophylaxis -Heparin.  Dispo: Disposition is deferred at this time, awaiting improvement of current medical problems. Anticipated discharge in approximately 1-2 day(s).   The patient does have a current PCP Annett Gula, MD), therefore is require OPC follow-up after discharge.   The patient does not have transportation limitations that hinder transportation to clinic appointments.   Signed:  Luisa Dago, MD, PhD PGY-1 Internal Medicine Teaching Service Pager: [phone] 12/26/2013, 6:54 PM

## 2013-12-26 NOTE — ED Notes (Signed)
Brought pt back to room; pt undressed, in gown, on continuous pulse oximetry and blood pressure cuff; warm blankets given and Sharyn Lull, RN aware of pt

## 2013-12-26 NOTE — ED Notes (Signed)
Patient returned from X-ray 

## 2013-12-26 NOTE — ED Notes (Signed)
Patient transported to X-ray 

## 2013-12-26 NOTE — ED Provider Notes (Signed)
CSN: [id number]     Arrival date & time 12/26/13  1325 History   First MD Initiated Contact with Patient 12/26/13 1329     Chief Complaint  Patient presents with  . Hyperglycemia     (Consider location/radiation/quality/duration/timing/severity/associated sxs/prior Treatment) HPI Comments: Patient is a 23 yo F PMHx significant for type 1 DM presenting to the ED for hyperglycemia. Patient states the last three days her glucose levels have been rising from 400s to too high on her monitor despite taking her insulin as prescribed. She states she has had non-productive cough for two days and last evening developed multiple episodes of non-bloody diarrhea with increased fatigue and lightheadedness. Denies any fevers, chills, emesis. Last admission for DKA May 2015.   Patient is a 23 y.o. female presenting with hyperglycemia.  Hyperglycemia Associated symptoms: chest pain and fatigue   Associated symptoms: no abdominal pain, no nausea, no shortness of breath and no vomiting     Past Medical History  Diagnosis Date  . Gonorrhea 08/2011    Treated in 09/2011  . Diabetes mellitus 2001    Diagnosed at age 72 ; Type I  . DKA (diabetic ketoacidoses) 08/19/2013   Past Surgical History  Procedure Laterality Date  . No past surgeries    . Incision and drainage perirectal abscess Right 08/18/2013    Procedure: IRRIGATION AND DEBRIDEMENT GLUTEAL ABSCESS;  Surgeon: Ralene Ok, MD;  Location: [facility];  Service: General;  Laterality: Right;  . Incision and drainage perirectal abscess Right 09/19/2013    Procedure: IRRIGATION AND DEBRIDEMENT RIGHT GLUTEAL AND LABIAL ABSCESSES;  Surgeon: Ralene Ok, MD;  Location: [facility];  Service: General;  Laterality: Right;  . Incision and drainage perirectal abscess Right 09/24/2013    Procedure: IRRIGATION AND DEBRIDEMENT PERIRECTAL ABSCESS;  Surgeon: Gwenyth Ober, MD;  Location: [place] [facility];  Service: General;  Laterality: Right;   Family History  Problem Relation  Age of Onset  . Anesthesia problems Neg Hx   . Asthma Mother   . Gout Father   . Diabetes Paternal Grandmother    History  Substance Use Topics  . Smoking status: Never Smoker   . Smokeless tobacco: Never Used  . Alcohol Use: No   OB History   Grav Para Term Preterm Abortions TAB SAB Ect Mult Living   1 0   1  1   0     Review of Systems  Constitutional: Positive for fatigue.  Respiratory: Positive for cough. Negative for shortness of breath.   Cardiovascular: Positive for chest pain.  Gastrointestinal: Positive for diarrhea. Negative for nausea, vomiting and abdominal pain.  Neurological: Positive for light-headedness. Negative for syncope.  All other systems reviewed and are negative.     Allergies  Penicillins  Home Medications   Prior to Admission medications   Medication Sig Start Date End Date Taking? Authorizing Provider  insulin aspart (NOVOLOG FLEXPEN) 100 UNIT/ML FlexPen Inject 6 Units into the skin 2 (two) times daily.   Yes Historical Provider, MD  insulin detemir (LEVEMIR) 100 UNIT/ML injection Inject 35 Units into the skin every morning.   Yes Historical Provider, MD   BP 119/82  Pulse 95  Temp(Src) 98.2 F (36.8 C) (Oral)  Resp 18  Ht 5\' 4"  (1.626 m)  Wt 124 lb (56.246 kg)  BMI 21.27 kg/m2  SpO2 100%  LMP 11/30/2013 Physical Exam  Nursing note and vitals reviewed. Constitutional: She is oriented to person, place, and time. She appears well-developed and well-nourished. No distress.  HENT:  Head: Normocephalic and atraumatic.  Right Ear: External ear normal.  Left Ear: External ear normal.  Nose: Nose normal.  Mouth/Throat: Oropharynx is clear and moist. No oropharyngeal exudate.  Eyes: Conjunctivae are normal.  Neck: Normal range of motion. Neck supple.  Cardiovascular: Normal rate, regular rhythm and normal heart sounds.   Pulmonary/Chest: Effort normal and breath sounds normal.  Abdominal: Soft. Normal appearance and bowel sounds are  normal. There is no tenderness. There is no rigidity, no rebound, no guarding and no CVA tenderness.  Musculoskeletal: Normal range of motion.  Neurological: She is alert and oriented to person, place, and time.  Skin: Skin is warm and dry. She is not diaphoretic.  Psychiatric: She has a normal mood and affect.    ED Course  Procedures (including critical care time) Medications  insulin regular (NOVOLIN R,HUMULIN R) 250 Units in sodium chloride 0.9 % 250 mL (1 Units/mL) infusion (not administered)  dextrose 5 %-0.45 % sodium chloride infusion (not administered)  sodium chloride 0.9 % bolus 1,000 mL (0 mLs Intravenous Stopped 12/26/13 1550)  sodium chloride 0.9 % bolus 1,000 mL (1,000 mLs Intravenous New Bag/Given 12/26/13 1603)    Labs Review Labs Reviewed  CBC WITH DIFFERENTIAL - Abnormal; Notable for the following:    RBC 5.21 (*)    MCV 73.5 (*)    All other components within normal limits  BASIC METABOLIC PANEL - Abnormal; Notable for the following:    Sodium 130 (*)    Chloride 94 (*)    CO2 18 (*)    Glucose, Bld 317 (*)    Creatinine, Ser 0.44 (*)    Anion gap 18 (*)    All other components within normal limits  URINALYSIS, ROUTINE W REFLEX MICROSCOPIC - Abnormal; Notable for the following:    APPearance CLOUDY (*)    Specific Gravity, Urine 1.045 (*)    Glucose, UA >1000 (*)    Ketones, ur 15 (*)    Protein, ur 30 (*)    All other components within normal limits  URINE MICROSCOPIC-ADD ON - Abnormal; Notable for the following:    Squamous Epithelial / LPF FEW (*)    Bacteria, UA MANY (*)    All other components within normal limits  CBG MONITORING, ED - Abnormal; Notable for the following:    Glucose-Capillary 363 (*)    All other components within normal limits  CBG MONITORING, ED - Abnormal; Notable for the following:    Glucose-Capillary 226 (*)    All other components within normal limits  PREGNANCY, URINE  BLOOD GAS, VENOUS    Imaging Review Dg Chest 2  View  12/26/2013   CLINICAL DATA:  Cough  EXAM: CHEST  2 VIEW  COMPARISON:  09/24/2013  FINDINGS: The heart size and mediastinal contours are within normal limits. Both lungs are clear. The visualized skeletal structures are unremarkable.  IMPRESSION: No active cardiopulmonary disease.   Electronically Signed   By: Inez Catalina M.D.   On: 12/26/2013 14:49     EKG Interpretation None      MDM   Final diagnoses:  Diabetic ketoacidosis without coma associated with type 1 diabetes mellitus    Filed Vitals:   12/26/13 1518  BP: 119/82  Pulse: 95  Temp:   Resp: 18   Afebrile, NAD, non-toxic appearing, AAOx4.   Patient initially tachycardic on arrival, improved with IVF. Glucose level elevated. Anion gap 18 with low bicarbonate levels and ketones in urine. Will start glucose stabilizer  order set for DKA. Pt has been diagnosed with a UTI. Pt is afebrile, no CVA tenderness, normotensive. Pt to be dc home with antibiotics and instructions to follow up with PCP if symptoms persist. Internal medicine residents consult is for admission. Patient d/w with Dr. Colin Rhein, agrees with plan.      Harlow Mares, PA-C 12/26/13 1637

## 2013-12-26 NOTE — H&P (Signed)
Date: 12/26/2013               Patient Name:  Ashley Freeman MRN: [id number]  DOB: Jan 26, 1991 Age / Sex: 23 y.o., female   PCP: Annett Gula, MD              Medical Service: Internal Medicine Teaching Service              Attending Physician: Dr. Burns Spain, MD    First Contact: Dr. Mitzie Na Drina Jobst Pager: [phone]  Second Contact: Dr. Dow Adolph Pager: [phone]            After Hours (After 5p/  First Contact Pager: [phone]  weekends / holidays): Second Contact Pager: [phone]   Chief Complaint:  Hyperglycemia  History of Present Illness: Ashley Freeman is a 23 year old woman with PMH of type 1 diabetes mellitus presenting with hyperglycemia.  She reports that her glucose levels have been rising over the last three days to the 400s to not readable on her monitor despite taking her insulin.  She has had a non-productive cough for the last two days, and reports one episode of non-bloody diarrhea yesterday with increased fatigue and lightheadedness.  She has been unable to eat anything all day.  She was last admitted for DKA on 09/19/13.  She denies fevers, chills, nausea, or emesis.  Review of Systems: Review of Systems  Constitutional: Positive for malaise/fatigue. Negative for fever, chills and diaphoresis.  HENT: Negative for congestion.   Eyes: Negative for blurred vision.  Respiratory: Positive for cough. Negative for hemoptysis, sputum production, shortness of breath and wheezing.   Cardiovascular: Negative for chest pain, palpitations and leg swelling.  Gastrointestinal: Positive for diarrhea. Negative for vomiting, abdominal pain, constipation and blood in stool.  Genitourinary: Negative for dysuria, urgency, frequency and flank pain.  Musculoskeletal: Negative for joint pain and myalgias.  Neurological: Negative for dizziness, sensory change, focal weakness and headaches.  Psychiatric/Behavioral: Negative for depression and suicidal ideas.     Meds: Medications Prior to Admission  Medication Sig Dispense Refill  . insulin aspart (NOVOLOG FLEXPEN) 100 UNIT/ML FlexPen Inject 6 Units into the skin 2 (two) times daily.      . insulin detemir (LEVEMIR) 100 UNIT/ML injection Inject 35 Units into the skin every morning.       Current Facility-Administered Medications  Medication Dose Route Frequency Provider Last Rate Last Dose  . 0.9 %  sodium chloride infusion   Intravenous Continuous Dow Adolph, MD 999 mL/hr at 12/26/13 1822    . 0.9 %  sodium chloride infusion   Intravenous Continuous Dow Adolph, MD      . dextrose 5 %-0.45 % sodium chloride infusion   Intravenous Continuous Dow Adolph, MD 125 mL/hr at 12/26/13 1754 999 mL at 12/26/13 1754  . dextrose 50 % solution 25 mL  25 mL Intravenous PRN Dow Adolph, MD      . heparin injection 5,000 Units  5,000 Units Subcutaneous 3 times per day Dow Adolph, MD      . insulin regular (NOVOLIN R,HUMULIN R) 250 Units in sodium chloride 0.9 % 250 mL (1 Units/mL) infusion   Intravenous Continuous Dow Adolph, MD 1.7 mL/hr at 12/26/13 1805 1.7 Units/hr at 12/26/13 1805  . potassium chloride 10 mEq in 100 mL IVPB  10 mEq Intravenous Q1H Dow Adolph, MD   10 mEq at 12/26/13 1828    Allergies: Allergies as of 12/26/2013 - Review Complete 12/26/2013  Allergen Reaction  Noted  . Penicillins Hives 11/11/2010   Past Medical History  Diagnosis Date  . Gonorrhea 08/2011    Treated in 09/2011  . Diabetes mellitus 2001    Diagnosed at age 68 ; Type I  . DKA (diabetic ketoacidoses) 08/19/2013   Past Surgical History  Procedure Laterality Date  . No past surgeries    . Incision and drainage perirectal abscess Right 08/18/2013    Procedure: IRRIGATION AND DEBRIDEMENT GLUTEAL ABSCESS;  Surgeon: Axel Filler, MD;  Location: [facility] OR;  Service: General;  Laterality: Right;  . Incision and drainage perirectal abscess Right 09/19/2013    Procedure: IRRIGATION AND DEBRIDEMENT  RIGHT GLUTEAL AND LABIAL ABSCESSES;  Surgeon: Axel Filler, MD;  Location: [facility] OR;  Service: General;  Laterality: Right;  . Incision and drainage perirectal abscess Right 09/24/2013    Procedure: IRRIGATION AND DEBRIDEMENT PERIRECTAL ABSCESS;  Surgeon: Cherylynn Ridges, MD;  Location: [facility] OR;  Service: General;  Laterality: Right;   Family History  Problem Relation Age of Onset  . Anesthesia problems Neg Hx   . Asthma Mother   . Gout Father   . Diabetes Paternal Grandmother    History   Social History  . Marital Status: Single    Spouse Name: N/A    Number of Children: 0  . Years of Education: 11th grade   Occupational History  . unemployed     has never worked   Social History Main Topics  . Smoking status: Never Smoker   . Smokeless tobacco: Never Used  . Alcohol Use: No  . Drug Use: No  . Sexual Activity: Yes    Birth Control/ Protection: None     Comment: As of 11/06/11 1 partner (female) not using protection   Other Topics Concern  . Not on file   Social History Narrative   Patient lives in [place] mother lives in [place].  Unemployed.  Previously worked for a Customer service manager.  Completed 11 grade working on [employer]. Patient 3 brothers     Physical Exam: Filed Vitals:   12/26/13 1800  BP: 139/95  Pulse: 99  Temp:   Resp: 23   Physical Exam  Constitutional: She is oriented to person, place, and time and well-developed, well-nourished, and in no distress. No distress.  HENT:  Head: Normocephalic and atraumatic.  Eyes: Conjunctivae and EOM are normal. Pupils are equal, round, and reactive to light. No scleral icterus.  Neck: Normal range of motion.  Cardiovascular: Normal rate and regular rhythm.   Pulmonary/Chest: Effort normal and breath sounds normal.  Abdominal: Soft. Bowel sounds are normal. She exhibits no distension. There is no tenderness.  Musculoskeletal: Normal range of motion. She exhibits no edema.  Neurological: She is alert and oriented to  person, place, and time. No cranial nerve deficit.  Skin: Skin is warm and dry. She is not diaphoretic.  Psychiatric: Affect normal.    Lab results: Basic Metabolic Panel:  Recent Labs  16/10/96 1346 12/26/13 1703  NA 130* 136*  K 4.2 3.6*  CL 94* 102  CO2 18* 21  GLUCOSE 317* 180*  BUN 13 11  CREATININE 0.44* 0.44*  CALCIUM 9.6 8.5   CBC:  Recent Labs  12/26/13 1346  WBC 6.2  NEUTROABS 2.8  HGB 13.6  HCT 38.3  MCV 73.5*  PLT 337   CBG:  Recent Labs  12/26/13 1340 12/26/13 1558 12/26/13 1804  GLUCAP 363* 226* 133*   Urine Drug Screen: Drugs of Abuse     Component  Value Date/Time   LABOPIA NONE DETECTED 08/09/2013 0005   LABOPIA NEGATIVE 12/18/2009 1453   COCAINSCRNUR NONE DETECTED 08/09/2013 0005   COCAINSCRNUR NEGATIVE 12/18/2009 1453   LABBENZ NONE DETECTED 08/09/2013 0005   LABBENZ NEGATIVE 12/18/2009 1453   AMPHETMU NONE DETECTED 08/09/2013 0005   AMPHETMU NEGATIVE 12/18/2009 1453   THCU NONE DETECTED 08/09/2013 0005   LABBARB NONE DETECTED 08/09/2013 0005    Urinalysis:  Recent Labs  12/26/13 1547  COLORURINE YELLOW  LABSPEC 1.045*  PHURINE 6.0  GLUCOSEU >1000*  HGBUR NEGATIVE  BILIRUBINUR NEGATIVE  KETONESUR 15*  PROTEINUR 30*  UROBILINOGEN 0.2  NITRITE NEGATIVE  LEUKOCYTESUR NEGATIVE   ABG    Component Value Date/Time   PHART 7.162* 08/16/2013 2245   PCO2ART 7.3* 08/16/2013 2245   PO2ART 131.0* 08/16/2013 2245   HCO3 23.9 12/26/2013 1642   TCO2 25 12/26/2013 1642   ACIDBASEDEF 2.0 12/26/2013 1642   O2SAT 35.0 12/26/2013 1642    Imaging results:  Dg Chest 2 View  12/26/2013   CLINICAL DATA:  Cough  EXAM: CHEST  2 VIEW  COMPARISON:  09/24/2013  FINDINGS: The heart size and mediastinal contours are within normal limits. Both lungs are clear. The visualized skeletal structures are unremarkable.  IMPRESSION: No active cardiopulmonary disease.   Electronically Signed   By: Alcide Clever M.D.   On: 12/26/2013 14:49   Assessment & Plan by  Problem: Principal Problem:   DKA (diabetic ketoacidoses) Active Problems:   Diabetes mellitus type 1 (uncontrolled)   #Type 1 diabetes mellitus with DKA Anion gap of 18 on admission with bicarb of 19, pH 7.33.  Symptoms consistent with URI, possible viral diarrhea though only one episode.  Reports good compliance, but does not check her sugars and takes Novolog when she thinks her sugar is high, which she reports is approximately two times per day.  Last A1c 19.3 in 07/2013.  This episode is likely due to noncompliance with a possible contribution of viral infection.  She was given ceftriaxone for UTI in the ED, but she has not symptoms and her urinalysis is not consistent with UTI.  Received 2 L NS in ER.  Last CBG 133. -Admit to step down unit. -Insulin gtt. -Repeat BMP now the q2h. -Switch to D5 1/2 NS at 125 ml/hr. -10 mEq KCl in 100 ml x2 -NPO until anion gap closed. -Diabetes coordinator consult.  #Cough Most consistent with URI.  Chest x-ray unremarkable. -Supportive care.  #Diarrhea One episode yesterday.  Previously received antibiotics in 08/2012 for perirectal abscess that is now healed. Possibly viral.  Only one episode, so unlikely to be cdiff.  Will have low threshold to order testing due to antibiotic use and hyperglycemia. -Rehydration as above.  #DVT prophylaxis -Heparin.  Dispo: Disposition is deferred at this time, awaiting improvement of current medical problems. Anticipated discharge in approximately 1-2 day(s).   The patient does have a current PCP Annett Gula, MD), therefore is require OPC follow-up after discharge.   The patient does not have transportation limitations that hinder transportation to clinic appointments.   Signed:  Luisa Dago, MD, PhD PGY-1 Internal Medicine Teaching Service Pager: [phone] 12/26/2013, 6:54 PM  is likely due to noncompliance with a possible contribution of viral infection. She was given ceftriaxone for UTI in the ED, but she has not symptoms and her urinalysis is not consistent with UTI. Received 2 L NS in ER. Last CBG 133.  -Admit to step down unit.  -Insulin gtt.  -Repeat BMP now the q2h.  -Switch to D5 1/2 NS at 125 ml/hr.  -10 mEq KCl in 100 ml x2  -NPO until anion gap closed.  -Diabetes coordinator consult.   #Cough  Most consistent with URI. Chest x-ray unremarkable.  -Supportive care.   #Diarrhea  One episode yesterday. Previously received antibiotics in 08/2012 for perirectal abscess that is now healed. Possibly viral. Only one episode, so unlikely to be cdiff. Will have low threshold to order testing due to antibiotic use and hyperglycemia.  -Rehydration as above.   #DVT prophylaxis  -Heparin.   Dispo: Disposition is deferred at this time, awaiting improvement of current medical problems. Anticipated discharge in approximately 1-2 day(s).  The patient does have a current PCP Annett Gula, MD), therefore is require OPC follow-up after discharge.  The patient does not have transportation limitations that hinder transportation to  clinic appointments.   Signed:  Luisa Dago, MD, PhD  PGY-1 Internal Medicine Teaching Service  Pager: [phone]  12/26/2013, 6:54 PM

## 2013-12-26 NOTE — H&P (Signed)
I have reviewed and discussed the plan with Darcus Austin, MS3.  Please see my separate note.  Arman Filter, MD, PhD Internal Medicine Intern Pager: [phone] 12/26/2013,8:07 PM

## 2013-12-27 LAB — BASIC METABOLIC PANEL
ANION GAP: 15 (ref 5–15)
ANION GAP: 13 (ref 5–15)
Anion gap: 15 (ref 5–15)
BUN: 6 mg/dL (ref 6–23)
BUN: 5 mg/dL — ABNORMAL LOW (ref 6–23)
BUN: 4 mg/dL — ABNORMAL LOW (ref 6–23)
CALCIUM: 8.8 mg/dL (ref 8.4–10.5)
CALCIUM: 8.1 mg/dL — AB (ref 8.4–10.5)
CHLORIDE: 103 meq/L (ref 96–112)
CHLORIDE: 103 meq/L (ref 96–112)
CHLORIDE: 106 meq/L (ref 96–112)
CO2: 19 meq/L (ref 19–32)
CO2: 19 meq/L (ref 19–32)
CO2: 17 mEq/L — ABNORMAL LOW (ref 19–32)
Calcium: 8.7 mg/dL (ref 8.4–10.5)
Creatinine, Ser: 0.44 mg/dL — ABNORMAL LOW (ref 0.50–1.10)
Creatinine, Ser: 0.4 mg/dL — ABNORMAL LOW (ref 0.50–1.10)
Creatinine, Ser: 0.39 mg/dL — ABNORMAL LOW (ref 0.50–1.10)
GFR calc Af Amer: >90 mL/min (ref >90)
GFR calc Af Amer: >90 mL/min (ref >90)
GFR calc Af Amer: >90 mL/min (ref >90)
GFR calc non Af Amer: >90 mL/min (ref >90)
GFR calc non Af Amer: >90 mL/min (ref >90)
GLUCOSE: 157 mg/dL — AB (ref 70–99)
Glucose, Bld: 85 mg/dL (ref 70–99)
Glucose, Bld: 100 mg/dL — ABNORMAL HIGH (ref 70–99)
POTASSIUM: 4.3 meq/L (ref 3.7–5.3)
POTASSIUM: 4.5 meq/L (ref 3.7–5.3)
Potassium: 3.7 mEq/L (ref 3.7–5.3)
SODIUM: 137 meq/L (ref 137–147)
SODIUM: 136 meq/L — AB (ref 137–147)
Sodium: 137 mEq/L (ref 137–147)

## 2013-12-27 LAB — GLUCOSE, CAPILLARY
GLUCOSE-CAPILLARY: 55 mg/dL — AB (ref 70–99)
GLUCOSE-CAPILLARY: 98 mg/dL (ref 70–99)
GLUCOSE-CAPILLARY: 85 mg/dL (ref 70–99)
GLUCOSE-CAPILLARY: 192 mg/dL — AB (ref 70–99)
GLUCOSE-CAPILLARY: 134 mg/dL — AB (ref 70–99)
GLUCOSE-CAPILLARY: 139 mg/dL — AB (ref 70–99)
GLUCOSE-CAPILLARY: 187 mg/dL — AB (ref 70–99)
Glucose-Capillary: 219 mg/dL — ABNORMAL HIGH (ref 70–99)
Glucose-Capillary: 241 mg/dL — ABNORMAL HIGH (ref 70–99)
Glucose-Capillary: 73 mg/dL (ref 70–99)

## 2013-12-27 MED ORDER — SODIUM CHLORIDE 0.9 % IV SOLN
INTRAVENOUS | Status: AC
  Administered 2013-12-27: 23:00:00 via INTRAVENOUS
  Administered 2013-12-27: 999 mL via INTRAVENOUS

## 2013-12-27 MED ORDER — SODIUM CHLORIDE 0.9 % IV BOLUS (SEPSIS)
1000.0000 mL | Freq: Once | INTRAVENOUS | Status: AC
  Administered 2013-12-27: 1000 mL via INTRAVENOUS

## 2013-12-27 NOTE — ED Provider Notes (Signed)
Medical screening examination/treatment/procedure(s) were conducted as a shared visit with non-physician practitioner(s) and myself.  I personally evaluated the patient during the encounter.   EKG Interpretation None      I performed an examination on the patient including cardiac, pulmonary, and gi systems which were unremarkable  Briefly, the pt is a 23 y.o. female who presented with hyperglycemia.  This is likely due to dietary noncompliance as when I examined the pt she had a sugary drink in the room that she had been drinking.  Labs with DKA.  Consulted for admission.   Debby Freiberg, MD 12/27/13 [phone]

## 2013-12-27 NOTE — Progress Notes (Signed)
Patient resting in bed. A&Ox4. Needs met. Call light within reach. NS at 150cc/hour. Telemetry leads on. BS checked.

## 2013-12-27 NOTE — Progress Notes (Addendum)
Subjective:    Currently, the patient reports feeling better this morning.  She was able to eat dinner last night and breakfast this morning without issues.  She denies any further episodes of diarrhea and cough has resolved.  She denies light-headedness, SOB, chest tightness, and nausea.  Interval Events: -Transitioned to subcutaneous insulin (Levemir 20 units) and carb modified diet last night. -AG 15 with bicarb of 19 this morning and stable on recheck. -CBG dropped to 55 last night and resolved with orange juice and crackers, otherwise CBG stable. -VS stable.   Objective:    Vital Signs:   Temp:  [97.7 F (36.5 C)-98.3 F (36.8 C)] 97.7 F (36.5 C) (08/30 0700) Pulse Rate:  [88-114] 91 (08/30 0630) Resp:  [15-23] 16 (08/30 0630) BP: (105-139)/(66-95) 122/76 mmHg (08/30 0630) SpO2:  [97 %-100 %] 99 % (08/30 0630) Weight:  [103 lb 9.9 oz (47 kg)-124 lb (56.246 kg)] 103 lb 9.9 oz (47 kg) (08/29 1755)    24-hour weight change: Weight change:   Intake/Output:   Intake/Output Summary (Last 24 hours) at 12/27/13 1101 Last data filed at 12/27/13 1000  Gross per 24 hour  Intake 2236.25 ml  Output      0 ml  Net 2236.25 ml      Physical Exam: General: Vital signs reviewed and noted. Well-developed, well-nourished, in no acute distress; alert, appropriate and cooperative throughout examination.  Lungs:  Normal respiratory effort. Clear to auscultation BL without crackles or wheezes.  Heart: RRR. S1 and S2 normal without gallop, murmur, or rubs.  Abdomen:  BS normoactive. Soft, Nondistended, non-tender.  No masses or organomegaly.  Extremities: No pretibial edema.     Labs:  Basic Metabolic Panel:  Recent Labs Lab 12/26/13 1346 12/26/13 1703 12/26/13 2037 12/27/13 0322 12/27/13 0945  NA 130* 136* 135* 137 137  K 4.2 3.6* 3.7 3.7 4.3  CL 94* 102 103 103 103  CO2 18* 21 20 19 19   GLUCOSE 317* 180* 188* 85 157*  BUN 13 11 9 6  5*  CREATININE 0.44* 0.44* 0.39*  0.44* 0.40*  CALCIUM 9.6 8.5 8.1* 8.8 8.1*   CBC:  Recent Labs Lab 12/26/13 1346  WBC 6.2  NEUTROABS 2.8  HGB 13.6  HCT 38.3  MCV 73.5*  PLT 337   CBG:  Recent Labs Lab 12/27/13 0207 12/27/13 0354 12/27/13 0647 12/27/13 0824 12/27/13 1010  GLUCAP 55* 98 73 71 192*    Microbiology: Results for orders placed during the hospital encounter of 12/26/13  MRSA PCR SCREENING     Status: None   Collection Time    12/26/13  6:12 PM      Result Value Ref Range Status   MRSA by PCR NEGATIVE  NEGATIVE Final   Comment:            The GeneXpert MRSA Assay (FDA     approved for NASAL specimens     only), is one component of a     comprehensive MRSA colonization     surveillance program. It is not     intended to diagnose MRSA     infection nor to guide or     monitor treatment for     MRSA infections.    Coagulation Studies: No results found for this basename: LABPROT, INR,  in the last 72 hours   Imaging: Dg Chest 2 View  12/26/2013   CLINICAL DATA:  Cough  EXAM: CHEST  2 VIEW  COMPARISON:  09/24/2013  FINDINGS: The  heart size and mediastinal contours are within normal limits. Both lungs are clear. The visualized skeletal structures are unremarkable.  IMPRESSION: No active cardiopulmonary disease.   Electronically Signed   By: Inez Catalina M.D.   On: 12/26/2013 14:49       Medications:    Infusions: . sodium chloride 150 mL/hr at 12/27/13 0701    Scheduled Medications: . heparin  5,000 Units Subcutaneous 3 times per day  . insulin aspart  0-9 Units Subcutaneous Q4H  . insulin detemir  20 Units Subcutaneous Q24H  . potassium chloride  40 mEq Oral BID    PRN Medications: dextrose   Assessment/ Plan:    Principal Problem:   DKA (diabetic ketoacidoses) Active Problems:   Diabetes mellitus type 1 (uncontrolled)  #Type 1 diabetes mellitus with DKA  Anion gap and acidosis resolved and stable this morning.  She tolerating PO and feeling better. Compliance  will be an issue for her, and she may benefit from switching her PCP to the [facility] if they are able to provide her with insulin.  She says that she has testing supplies and insulin at home that can hold her over for the next two weeks.  She will follow up in the [facility] this week, they have been sent a message to schedule her an appointment tomorrow. -Likely discharge today. -Will plan to discharge on Levemir 25 units in the mornings with Novolog 6 units with meals. -Continue NS at 150 ml/h this morning and discontinue prior to discharge. -Carb modified diet. -Diabetes coordinator not available on weekend.  #Cough  Resolved, likely a URI.  #Diarrhea  No further episodes.  Etiology unclear, but nor further treatment warranted at this time.   DVT PPX - heparin  CODE STATUS - Full code.  CONSULTS PLACED - Diabetes coordinator.  DISPO - Probable discharge today.    The patient does have a current PCP Aundra Dubin, Nino Glow, MD) and does need an [facility] hospital follow-up appointment after discharge.    Is the [facility] hospital follow-up appointment a one-time only appointment? no.  Does the patient have transportation limitations that hinder transportation to clinic appointments? no   SERVICE NEEDED AT [place]         Y = Yes, Blank = No PT:   OT:   RN:   Equipment:   Other:      Length of Stay: 1 day(s)   Signed: Arman Filter, MD  PGY-1, Internal Medicine Resident Pager: [phone] (7AM-5PM) 12/27/2013, 11:01 AM     --Addendum-- Arman Filter, MD, PhD Internal Medicine Intern Pager: [phone] 12/27/2013,2:49 PM  AG improved to 13 this afternoon, but bicarb low at 58. -Will keep overnight for continued hydration and monitoring. -Transfer to med-surg. -Diabetic coordinator can see tomorrow.

## 2013-12-27 NOTE — Progress Notes (Signed)
Subjective: [name] is a 23 YO woman with a PMH of uncontrolled DM1 who presented to the ED yesterday with DKA after 2 days of malaise and 1 episode of diarrhea. This morning, the patient reports that she slept well and is tolerating a normal diet. She has no acute complaints. She denies headache, nausea, vomiting, abdominal pain, or additional episodes of diarrhea. Patient reports that she had Medicaid, but she recently became uninsured and has had trouble affording her insulin. Patient is willing to speak with diabetic counselor and was also open to establishing care with [facility].  Objective: Vital signs in last 24 hours: Filed Vitals:   12/27/13 0200 12/27/13 0355 12/27/13 0630 12/27/13 0700  BP: 119/66 107/76 122/76   Pulse: 96 91 91   Temp: 97.9 F (36.6 C) 98 F (36.7 C)  97.7 F (36.5 C)  TempSrc: Oral Oral  Axillary  Resp: 19 20 16    Height:      Weight:      SpO2: 98% 99% 99%    Weight change:   Intake/Output Summary (Last 24 hours) at 12/27/13 0858 Last data filed at 12/27/13 0640  Gross per 24 hour  Intake 1479.58 ml  Output      0 ml  Net 1479.58 ml   BP 122/76  Pulse 91  Temp(Src) 97.7 F (36.5 C) (Axillary)  Resp 16  Ht 5\' 4"  (1.626 m)  Wt 47 kg (103 lb 9.9 oz)  BMI 17.78 kg/m2  SpO2 99%  LMP 11/30/2013 General appearance: alert, cooperative and no distress Head: Normocephalic, without obvious abnormality, atraumatic Lungs: clear to auscultation bilaterally Heart: regular rate and rhythm, S1, S2 normal, no murmur, click, rub or gallop Abdomen: soft, non-tender; bowel sounds normal; no masses,  no organomegaly Extremities: extremities normal, atraumatic, no cyanosis or edema Lab Results: Basic Metabolic Panel:  Recent Labs Lab 12/26/13 2037 12/27/13 0322  NA 135* 137  K 3.7 3.7  CL 103 103  CO2 20 19  GLUCOSE 188* 85  BUN 9 6  CREATININE 0.39* 0.44*  CALCIUM 8.1* 8.8   CBC:  Recent Labs Lab  12/26/13 1346  WBC 6.2  NEUTROABS 2.8  HGB 13.6  HCT 38.3  MCV 73.5*  PLT 337   CBG:  Recent Labs Lab 12/26/13 2153 12/26/13 2358 12/27/13 0207 12/27/13 0354 12/27/13 0647 12/27/13 0824  GLUCAP 242* 190* 55* 98 73 85    Micro Results: Recent Results (from the past 240 hour(s))  MRSA PCR SCREENING     Status: None   Collection Time    12/26/13  6:12 PM      Result Value Ref Range Status   MRSA by PCR NEGATIVE  NEGATIVE Final   Comment:            The GeneXpert MRSA Assay (FDA     approved for NASAL specimens     only), is one component of a     comprehensive MRSA colonization     surveillance program. It is not     intended to diagnose MRSA     infection nor to guide or     monitor treatment for     MRSA infections.   Studies/Results: Dg Chest 2 View  12/26/2013   CLINICAL DATA:  Cough  EXAM: CHEST  2 VIEW  COMPARISON:  09/24/2013  FINDINGS: The heart size and mediastinal contours are within normal limits. Both lungs are clear. The visualized skeletal structures are unremarkable.  IMPRESSION:  No active cardiopulmonary disease.   Electronically Signed   By: Inez Catalina M.D.   On: 12/26/2013 14:49   Medications: I have reviewed the patient's current medications. Scheduled Meds: . heparin  5,000 Units Subcutaneous 3 times per day  . insulin aspart  0-9 Units Subcutaneous Q4H  . insulin detemir  20 Units Subcutaneous Q24H  . potassium chloride  40 mEq Oral BID   Continuous Infusions: . sodium chloride 150 mL/hr at 12/27/13 0701   PRN Meds:.dextrose Assessment/Plan: Principal Problem:   DKA (diabetic ketoacidoses) Active Problems:   Diabetes mellitus type 1 (uncontrolled)  DKA:  Most recent AG 15, bicarb of 19 at [place]. Currently, patient is on NS at 150 mL/hour. Most recent CBG was 85 at 0715. In the past 12 hours, CBG levels ranged from 55-242. Patient had asymptomatic hypoglycemic episode to 55 overnight, was given orange juice and crackers and glucose  increased to 98 at next check. Patient received 20 units levemir at 2030 and is on sliding scale aspart for meals.   AG has been closed x3, but last BMET at [facility] was 25. Next BMET is scheduled for 1000 - will assess to ensure that gap stays closed. Bump in gap likely due to dehydration. If BMET shows AG is improving and patient is still tolerating a normal diet, plan to discharge this afternoon.  Uncontrolled DM1: Last HbA1C was 19.3 in 07/2013.Patient states that she has trouble getting her medications at home because she does not currently have insurance. Will consult with diabetes educator while inpatient and look to schedule follow up with [facility] to better control her average glucose levels.   DVT Ppx: Heparin 5000 U subcut q8h   Dispo: Plan to discharge today, pending BMET results.  The patient does have a current PCP (Dr. Aundra Dubin) and does need an [facility] hospital follow-up appointment after discharge.   The patient does not have transportation limitations that hinder transportation to clinic appointments.   This is a Careers information officer Note.  The care of the patient was discussed with Dr. Trudee Kuster and the assessment and plan formulated with their assistance.  Please see their attached note for official documentation of the daily encounter.   LOS: 1 day   Darcus Austin, Med Student 12/27/2013, 8:58 AM

## 2013-12-27 NOTE — Progress Notes (Signed)
I have reviewed and discussed the plan with Darcus Austin, MS3.  Please see my separate note.  Arman Filter, MD, PhD Internal Medicine Intern Pager: [phone] 12/27/2013,12:43 PM

## 2013-12-27 NOTE — Progress Notes (Signed)
CBG 55 Orange juice and graham crackers with peanut butter provided. Pt asymptomatic. Will recheck CBG in 30 minutes.

## 2013-12-27 NOTE — Discharge Instructions (Addendum)
·   Thank you for allowing [place] to be involved in your healthcare while you were hospitalized at [facility].   Please note that there have been changes to your home medications.  --> PLEASE LOOK AT YOUR DISCHARGE MEDICATION LIST FOR DETAILS.   Please call your PCP if you have any questions or concerns, or any difficulty getting any of your medications.  Please return to the ER if you have worsening of your symptoms or new severe symptoms arise.  Decrease your dose of Levemir to 25 units in the morning.  Take your next dose tomorrow (Tuesday) morning.  You are scheduled to see Dr. Marigene Ehlers in clinic on Thursday afternoon.  [facility] will contact you if they have openings in their clinic.  This may be a good place for you to follow because they can give you cheaper insulin.

## 2013-12-27 NOTE — Care Management Note (Unsigned)
    Page 1 of 1   12/27/2013     10:45:36 AM CARE MANAGEMENT NOTE 12/27/2013  Patient:  Ashley Freeman, Ashley Freeman   Account Number:  [id number]  Date Initiated:  12/27/2013  Documentation initiated by:  [facility]  Subjective/Objective Assessment:   adm: DKA after 2 days of malaise and 1 episode of diarrhea     Action/Plan:   discharge planning   Anticipated DC Date:  12/28/2013   Anticipated DC Plan:        Reform  CM consult  Medication [facility]      Choice offered to / List presented to:             Status of service:  In process, will continue to follow Medicare Important Message given?   (If response is "NO", the following Medicare IM given date fields will be blank) Date Medicare IM given:   Medicare IM given by:   Date Additional Medicare IM given:   Additional Medicare IM given by:    Discharge Disposition:    Per UR Regulation:    If discussed at Long Length of Stay Meetings, dates discussed:    Comments:  12/27/13 10:30 CM met with pt in room to explain to pt MATCH was not available as a rewource as pt has already used it 4/24-5/1 and did not follow up at the [facility] to secure a PCP or insurance with navigator.  CM gave pt another [facility] pamphlet  and explained to her CM will follow tomorrow and hopefully secure an appt and med assistance with the [facility]. CM also spoke with the Diabetic Counselor who was very familiar with pt and stated pt has  been counseled several times but pt remains reluctant to manage her medical issues.  Diabetic Counselor states she will follow her tomorrow (8/31) prior to discharge.  CM made RN aware of aforementioned.  Mariane Masters, BSN, CM [phone].

## 2013-12-27 NOTE — Progress Notes (Signed)
Utilization Review Completed.   Sherre Wooton, RN, BSN Nurse Case Manager  

## 2013-12-27 NOTE — H&P (Signed)
  Date: 12/27/2013  Patient name: Ashley Freeman  Medical record number: [id number]  Date of birth: 1991-02-27   I have seen and evaluated Ashley Freeman and discussed their care with the Residency Team. [name] is a 23 yo Type I poorly controlled diabetic who was admitted for DKA. EPIC lists her as having medicaid but per pt, she lost it a long time ago. She has reapplied but doesn't know the outcome. She has had to buy her insulin (levemir and novolog) out of pocket and doesn't have the resources to do so. She states she has insulin at home but has been taking less to try to stretch it out. She is a pt at [facility]. She has had a few days of not feeling well, a non productive cough, and one episode of D. This is her 5th admission in the past 6 months and she has had three other ER visits. She denies any sign / sxs infxn.   She was tx with standard DKA protocol. Her bicarb was 18 at admit, now 19 down from 21. Her gap was 18 on admit, now 15 up from 12. A1C April 2015 was 19.  Assessment and Plan: I have seen and evaluated the patient as outlined above. I agree with the formulated Assessment and Plan as detailed in the residents' admission note, with the following changes:   1. DKA - Only known trigger is decreased (? No) insulin use. concerning that her gap is increasing and her bicarb falling. Dr Alice Rieger has increased her IVF rate. She is now on lower dose levemir with SSI.   2. Poorly controlled type I Dm - She needs an affordable DM regimen - meds and testing. [facility] is unable to provide freq / monthly samples and we have no testing supplies. Will ask care mgmt to provide advice. She may need to change care to [facility].   Repeat labs today and if bicarb and gap normalize and pt has way to get insulin and testing supplies, may D/C home.   Bartholomew Crews, MD 8/30/20159:13 AM

## 2013-12-28 LAB — BASIC METABOLIC PANEL
ANION GAP: 11 (ref 5–15)
BUN: 7 mg/dL (ref 6–23)
CALCIUM: 9 mg/dL (ref 8.4–10.5)
CO2: 23 mEq/L (ref 19–32)
CREATININE: 0.42 mg/dL — AB (ref 0.50–1.10)
Chloride: 104 mEq/L (ref 96–112)
GFR calc non Af Amer: >90 mL/min (ref >90)
Glucose, Bld: 87 mg/dL (ref 70–99)
Potassium: 4.1 mEq/L (ref 3.7–5.3)
Sodium: 138 mEq/L (ref 137–147)

## 2013-12-28 LAB — GLUCOSE, CAPILLARY
Glucose-Capillary: 102 mg/dL — ABNORMAL HIGH (ref 70–99)
Glucose-Capillary: 103 mg/dL — ABNORMAL HIGH (ref 70–99)
Glucose-Capillary: 275 mg/dL — ABNORMAL HIGH (ref 70–99)

## 2013-12-28 MED ORDER — INSULIN DETEMIR 100 UNIT/ML ~~LOC~~ SOLN: 25.0000 [IU] | SUBCUTANEOUS | Status: DC

## 2013-12-28 MED ORDER — INSULIN ASPART 100 UNIT/ML FLEXPEN: 6.0000 [IU] | PEN_INJECTOR | Freq: Three times a day (TID) | SUBCUTANEOUS | Status: DC

## 2013-12-28 MED ORDER — INSULIN DETEMIR 100 UNIT/ML ~~LOC~~ SOLN
5.0000 [IU] | Freq: Once | SUBCUTANEOUS | Status: AC
  Administered 2013-12-28: 5 [IU] via SUBCUTANEOUS
  Filled 2013-12-28: qty 0.05

## 2013-12-28 MED ORDER — INSULIN DETEMIR 100 UNIT/ML ~~LOC~~ SOLN: 25.0000 [IU] | Freq: Every morning | SUBCUTANEOUS | Status: DC

## 2013-12-28 NOTE — Progress Notes (Signed)
I have reviewed and discussed the plan with Darcus Austin, MS3.  Please see my separate note.  Arman Filter, MD, PhD Internal Medicine Intern Pager: [phone] 12/28/2013,3:17 PM

## 2013-12-28 NOTE — Progress Notes (Addendum)
Inpatient Diabetes Program Recommendations  AACE/ADA: New Consensus Statement on Inpatient Glycemic Control (2013)  Target Ranges:  Prepandial:   less than 140 mg/dL      Peak postprandial:   less than 180 mg/dL (1-2 hours)      Critically ill patients:  140 - 180 mg/dL   Consider changing insulin regimen to a more affordable insulin such as ReliOn 70/30 which can be purchased out of pocket for approximately $25 a vial at [employer].   ADDENDUM:  This coordinator spoke with patient at bedside concerning admission with DKA.  When patient was interviewed and asked what may have led to this admission she said  "I just didn't take my medicine".  When asked why she replied "because I am lazy".  Pt reports that she does have Levemir and Novolog at home and it is not expired. She also says she has a meter and strips.  When asked if she has trouble getting her insulin she said "no".  This coordinator offered to recommend a more affordable regimen with Novolin 70/30 and she replied she would just keep what she has now and follow-up at the [facility].  Reminded patient that she did not show up at her last appointment at the [facility] and she should not do that again.  She reports that she did not have transportation that day.  Encouraged patient to notify the [facility] if she sees that she will not have transportation to her next appointment instead of just not showing up.  Patient has a personal cell phone that is currently at her bedside.  Patient has a flat affect during interview so she was questioned about being depressed or sad.  She states that she is not depressed.  When asked what she does when she is not at work she states she watches TV or plays with her cell phone.  When asked what her long-term goals are she said she does not have any.  Encouraged patient to set at least one goal and steps to get there.  Encouraged patient to manage her diabetes and offered outpatient referral for classes at the [facility] but patient said she is not ready to go.  No further questions/concerns at the end of our visit.  Thank you  Raoul Pitch BSN, RN,CDE Inpatient Diabetes Coordinator [phone] (team pager)

## 2013-12-28 NOTE — Progress Notes (Signed)
Subjective:    Ashley Freeman continues to feel well this morning.  She ate all of her meals yesterday without issue.  She denies cough, diarrhea, light-headedness, SOB, chest tightness, or nausea.  Interval Events: -Unable to be seen by diabetes coordinator yesterday because it was the weekend. -Gap closed to 13 yesterday afternoon, but bicarb down to 17, so kept overnight. -AG 11 this morning with CO2 of 23. -VS stable.   Objective:    Vital Signs:   Temp:  [97.9 F (36.6 C)-98.5 F (36.9 C)] 97.9 F (36.6 C) (08/31 0355) Pulse Rate:  [90-101] 101 (08/31 0355) Resp:  [18-26] 18 (08/31 0355) BP: (116-119)/(72-85) 119/76 mmHg (08/31 0355) SpO2:  [97 %-100 %] 97 % (08/31 0355) Weight:  [113 lb (51.256 kg)] 113 lb (51.256 kg) (08/30 2038) Last BM Date: 12/25/13  24-hour weight change: Weight change: -11 lb (-4.99 kg)  Intake/Output:   Intake/Output Summary (Last 24 hours) at 12/28/13 0834 Last data filed at 12/28/13 0615  Gross per 24 hour  Intake 2864.17 ml  Output      0 ml  Net 2864.17 ml      Physical Exam: General: Vital signs reviewed and noted. Well-developed, well-nourished, in no acute distress; alert, appropriate and cooperative throughout examination.  Lungs:  Normal respiratory effort. Clear to auscultation BL without crackles or wheezes.  Heart: RRR. S1 and S2 normal without gallop, murmur, or rubs.  Abdomen:  BS normoactive. Soft, Nondistended, non-tender.  No masses or organomegaly.  Extremities: No pretibial edema.     Labs:  Basic Metabolic Panel:  Recent Labs Lab 12/26/13 2037 12/27/13 0322 12/27/13 0945 12/27/13 1350 12/28/13 0430  NA 135* 137 137 136* 138  K 3.7 3.7 4.3 4.5 4.1  CL 103 103 103 106 104  CO2 20 19 19  17* 23  GLUCOSE 188* 85 157* 100* 87  BUN 9 6 5* 4* 7  CREATININE 0.39* 0.44* 0.40* 0.39* 0.42*  CALCIUM 8.1* 8.8 8.1* 8.7 9.0   CBC:  Recent Labs Lab 12/26/13 1346  WBC 6.2  NEUTROABS 2.8  HGB 13.6  HCT 38.3   MCV 73.5*  PLT 337   CBG:  Recent Labs Lab 12/27/13 1633 12/27/13 2034 12/27/13 2353 12/28/13 0354 12/28/13 0738  GLUCAP 187* 241* 219* 102* 103*    Microbiology: Results for orders placed during the hospital encounter of 12/26/13  MRSA PCR SCREENING     Status: None   Collection Time    12/26/13  6:12 PM      Result Value Ref Range Status   MRSA by PCR NEGATIVE  NEGATIVE Final   Comment:            The GeneXpert MRSA Assay (FDA     approved for NASAL specimens     only), is one component of a     comprehensive MRSA colonization     surveillance program. It is not     intended to diagnose MRSA     infection nor to guide or     monitor treatment for     MRSA infections.    Coagulation Studies: No results found for this basename: LABPROT, INR,  in the last 72 hours   Imaging: Dg Chest 2 View  12/26/2013   CLINICAL DATA:  Cough  EXAM: CHEST  2 VIEW  COMPARISON:  09/24/2013  FINDINGS: The heart size and mediastinal contours are within normal limits. Both lungs are clear. The visualized skeletal structures are unremarkable.  IMPRESSION: No active  cardiopulmonary disease.   Electronically Signed   By: Inez Catalina M.D.   On: 12/26/2013 14:49       Medications:    Infusions:    Scheduled Medications: . heparin  5,000 Units Subcutaneous 3 times per day  . insulin aspart  0-9 Units Subcutaneous Q4H  . insulin detemir  20 Units Subcutaneous Q24H  . potassium chloride  40 mEq Oral BID    PRN Medications: dextrose   Assessment/ Plan:    Principal Problem:   DKA (diabetic ketoacidoses) Active Problems:   Diabetes mellitus type 1 (uncontrolled)  #Type 1 diabetes mellitus with DKA  Anion gap and acidosis now completely resolved, and she is feeling well.  Decided to keep overnight yesterday so she could see the diabetes coordinator, and we can address her medication needs.  Will work on setting her up to get insulin today and arrange an appointment at the  [facility] and [facility] [facility].  Received Detemir 20 units and Novolog 3 units via sliding scale yesterday despite eating, so will need to reduce home regimen on discharge (reports 35 units in mornings with 6 units with meals). -Discharge today. -Will plan to discharge on Levemir 25 units in the mornings with Novolog 6 units with meals. -Stop IV fluids this morning. -Carb modified diet. -Meet with diabetes coordinator. -Arrange appointment with [facility] and [facility].   DVT PPX - heparin  CODE STATUS - Full code.  CONSULTS PLACED - Diabetes coordinator.  DISPO - Probable discharge today.    The patient does have a current PCP Aundra Dubin, Nino Glow, MD) and does need an [facility] hospital follow-up appointment after discharge.    Is the [facility] hospital follow-up appointment a one-time only appointment? no.  Does the patient have transportation limitations that hinder transportation to clinic appointments? no   SERVICE NEEDED AT [place]         Y = Yes, Blank = No PT:   OT:   RN:   Equipment:   Other:      Length of Stay: 2 day(s)   Signed: Arman Filter, MD  PGY-1, Internal Medicine Resident Pager: [phone] (7AM-5PM) 12/28/2013, 8:34 AM

## 2013-12-28 NOTE — Progress Notes (Signed)
Subjective: [name] is a 23 YO woman with a PMH of uncontrolled DM1 who presented to the ED two days ago with DKA after 2 days of malaise and 1 episode of diarrhea. This morning, the patient is sleepy and states that she didn't sleep well last night. She has no acute complaints. She denies headache, nausea, vomiting, abdominal pain, or additional episodes of diarrhea. I told the patient that the diabetes educator would stop by today before she is discharged.  Objective: Vital signs in last 24 hours: Filed Vitals:   12/27/13 1500 12/27/13 1726 12/27/13 2038 12/28/13 0355  BP:  118/85 116/72 119/76  Pulse: 92 90 100 101  Temp: 98.5 F (36.9 C) 98.3 F (36.8 C) 98.3 F (36.8 C) 97.9 F (36.6 C)  TempSrc: Oral Oral    Resp: 26 23 19 18   Height:      Weight:   51.256 kg (113 lb)   SpO2: 100% 100% 100% 97%   Weight change: -4.99 kg (-11 lb)  Intake/Output Summary (Last 24 hours) at 12/28/13 0852 Last data filed at 12/28/13 0615  Gross per 24 hour  Intake 2864.17 ml  Output      0 ml  Net 2864.17 ml   BP 119/76  Pulse 101  Temp(Src) 97.9 F (36.6 C) (Oral)  Resp 18  Ht 5\' 4"  (1.626 m)  Wt 51.256 kg (113 lb)  BMI 19.39 kg/m2  SpO2 97%  LMP 11/30/2013 General appearance: cooperative, no distress and sleepy Lungs: clear to auscultation bilaterally Heart: regular rate and rhythm, S1, S2 normal, no murmur, click, rub or gallop Abdomen: soft, non-tender; bowel sounds normal; no masses,  no organomegaly Extremities: extremities normal, atraumatic, no cyanosis or edema  Lab Results: Basic Metabolic Panel:  Recent Labs Lab 12/27/13 1350 12/28/13 0430  NA 136* 138  K 4.5 4.1  CL 106 104  CO2 17* 23  GLUCOSE 100* 87  BUN 4* 7  CREATININE 0.39* 0.42*  CALCIUM 8.7 9.0   CBC:  Recent Labs Lab 12/26/13 1346  WBC 6.2  NEUTROABS 2.8  HGB 13.6  HCT 38.3  MCV 73.5*  PLT 337   CBG:  Recent Labs Lab 12/27/13 1417 12/27/13 1633 12/27/13 2034 12/27/13 2353  12/28/13 0354 12/28/13 0738  GLUCAP 139* 187* 241* 219* 102* 103*   Studies/Results: Dg Chest 2 View  12/26/2013   CLINICAL DATA:  Cough  EXAM: CHEST  2 VIEW  COMPARISON:  09/24/2013  FINDINGS: The heart size and mediastinal contours are within normal limits. Both lungs are clear. The visualized skeletal structures are unremarkable.  IMPRESSION: No active cardiopulmonary disease.   Electronically Signed   By: Inez Catalina M.D.   On: 12/26/2013 14:49   Medications: I have reviewed the patient's current medications. Scheduled Meds: . heparin  5,000 Units Subcutaneous 3 times per day  . insulin aspart  0-9 Units Subcutaneous Q4H  . insulin detemir  20 Units Subcutaneous Q24H  . potassium chloride  40 mEq Oral BID   Continuous Infusions:  PRN Meds:.dextrose Assessment/Plan: Principal Problem:   DKA (diabetic ketoacidoses) Active Problems:   Diabetes mellitus type 1 (uncontrolled)  DKA:  Most recent AG 11, bicarb of 23 at 0430. Currently, patient is not receiving fluids. Most recent CBG was 103 at 0738. In the past 12 hours, CBG levels ranged from 102-241. Patient received 20 units levemir at 2041 and is on sliding scale aspart for meals.  AG has been closed x6, but bicarb has ranged from 17-20 since  the gap was closed. This morning, the bicarb had improved to 23. Plan to discharge today after speaking with diabetic counselor.  Uncontrolled DM1:  Last HbA1C was 19.3 in 07/2013.Patient states that she has trouble getting her medications at home because she does not currently have insurance. Diabetic educator will consult with Ashley Freeman before discharge today. Plan to schedule follow up with [place] to better control her average glucose levels.   DVT Ppx: Heparin 5000 U subcut q8h   Dispo: Plan to discharge today.  The patient does have a current PCP (Dr. Aundra Dubin) and does need an [facility] hospital follow-up appointment after discharge.   The  patient does not have transportation limitations that hinder transportation to clinic appointments.  This is a Careers information officer Note.  The care of the patient was discussed with Dr. Trudee Kuster and the assessment and plan formulated with their assistance.  Please see their attached note for official documentation of the daily encounter.   LOS: 2 days   Darcus Austin, Med Student 12/28/2013, 8:52 AM

## 2013-12-28 NOTE — Discharge Summary (Signed)
Name: Ashley Freeman MRN: [id number] DOB: 11/29/1990 23 y.o. PCP: Cresenciano Genre, MD  Date of Admission: 12/26/2013  1:26 PM Date of Discharge: 12/28/2013 Attending Physician: Bartholomew Crews, MD  Discharge Diagnosis:  Principal Problem:   DKA (diabetic ketoacidoses) Active Problems:   Diabetes mellitus type 1 (uncontrolled)   Discharge Medications:   Medication List         insulin aspart 100 UNIT/ML FlexPen  Commonly known as:  NOVOLOG FLEXPEN  Inject 6 Units into the skin 3 (three) times daily with meals. Do not use if blood sugar <100 or you eat <50% of your meal.     insulin detemir 100 UNIT/ML injection  Commonly known as:  LEVEMIR  Inject 0.25 mLs (25 Units total) into the skin every morning.        Disposition and follow-up:   Ashley Freeman was discharged from [facility] in Good condition.  At the hospital follow up visit please address:  1.  Access to medications, assess insulin regimen, discuss establishing at the [facility] and [facility] for primary care because they can provide insulin at decreased cost.  2.  Labs / imaging needed at time of follow-up: CBG, BMP.  3.  Pending labs/ test needing follow-up: None.  Follow-up Appointments:     Follow-up Information   Follow up with Cresenciano Genre, MD In 3 days. (2:45 pm)    Specialty:  Internal Medicine   Contact information:   [address] [place] [address] [phone]       Follow up with [facility]    . Schedule an appointment as soon as possible for a visit in 2 weeks. (They will call you tomorrow with available appointments - try and schedule an appointment with them within the next 2 weeks.)    Contact information:   [place]  [id number] [phone]      Discharge Instructions:  Thank you for allowing [place] to be involved in your healthcare while you were hospitalized at [facility].   Please note that there have been changes to your home medications.  --> PLEASE LOOK AT YOUR DISCHARGE MEDICATION LIST FOR DETAILS.   Please call your PCP if you have any questions or concerns, or any difficulty getting any of your medications.  Please return to the ER if you have worsening of your symptoms or new severe symptoms arise.  Decrease your dose of Levemir to 25 units in the morning.  Take your next dose tomorrow (Tuesday) morning.  You are scheduled to see Dr. Marigene Ehlers in clinic on Thursday afternoon.  [facility] will contact you if they have openings in their clinic.  This may be a good place for you to follow because they can give you cheaper insulin. Discharge Instructions   Call MD for:  difficulty breathing, headache or visual disturbances    Complete by:  As directed      Call MD for:  extreme fatigue    Complete by:  As directed      Call MD for:  persistant dizziness or light-headedness    Complete by:  As directed      Call MD for:  persistant nausea and vomiting    Complete by:  As directed      Diet - low sodium heart healthy    Complete by:  As directed      Increase  activity slowly    Complete by:  As directed            Consultations: Diabetes coordinator.  Procedures Performed:  Dg Chest 2 View  12/26/2013   CLINICAL DATA:  Cough  EXAM: CHEST  2 VIEW  COMPARISON:  09/24/2013  FINDINGS: The heart size and mediastinal contours are within normal limits. Both lungs are clear. The visualized skeletal structures are unremarkable.  IMPRESSION: No active cardiopulmonary disease.   Electronically Signed   By: Inez Catalina M.D.   On: 12/26/2013 14:49   Admission HPI:  Ashley Freeman is a 23 year old woman with PMH of type 1 diabetes mellitus presenting with hyperglycemia.   She reports that her glucose levels have been rising over the last three days to the 400s to not readable on her monitor despite taking  her insulin. She has had a non-productive cough for the last two days, and reports one episode of non-bloody diarrhea yesterday with increased fatigue and lightheadedness. She has been unable to eat anything all day. She was last admitted for DKA on 09/19/13. She denies fevers, chills, nausea, or emesis.   Hospital Course by problem list: Principal Problem:   DKA (diabetic ketoacidoses) Active Problems:   Diabetes mellitus type 1 (uncontrolled)   #Uncontrolled type 1 diabetes with diabetic ketoacidosis Ashley Freeman presented after 2 days of malaise and 1 episode of diarrhea. Her appetite was greatly reduced and she wasn't using her insulin regularly. Anion gap of 18 on admission with bicarb of 18. She was given normal saline boluses and started on an insulin drip. When her blood sugar improved to <250, she was transitioned to D5 1/2 normal saline with potassium supplementation as needed. Her gap was slowly closed over 12 hours, and she was transitioned to subcutaneous insulin. Her diet was advanced as tolerated, and she was discharged home on Levemir 25 units in the morning (reduced from 35 units preadmission) with Novolog 6 units meal time coverage.  The diabetes coordinator was consulted and discussed insulin compliance with Ashley Freeman.  Ashley Freeman reported medication cost as a barrier to her compliance, so she may benefit from establishing at [facility] where they can help her get access to insulin at a reduced price.  Discharge Vitals:   BP 123/88  Pulse 102  Temp(Src) 98.5 F (36.9 C) (Oral)  Resp 20  Ht 5\' 4"  (1.626 m)  Wt 113 lb (51.256 kg)  BMI 19.39 kg/m2  SpO2 100%  LMP 11/30/2013  Discharge Labs:  Results for orders placed during the hospital encounter of 12/26/13 (from the past 24 hour(s))  GLUCOSE, CAPILLARY     Status: Abnormal   Collection Time    12/27/13 11:51 AM      Result Value Ref Range   Glucose-Capillary 134 (*) 70 - 99 mg/dL    BASIC METABOLIC PANEL     Status: Abnormal   Collection Time    12/27/13  1:50 PM      Result Value Ref Range   Sodium 136 (*) 137 - 147 mEq/L   Potassium 4.5  3.7 - 5.3 mEq/L   Chloride 106  96 - 112 mEq/L   CO2 17 (*) 19 - 32 mEq/L   Glucose, Bld 100 (*) 70 - 99 mg/dL   BUN 4 (*) 6 - 23 mg/dL   Creatinine, Ser 0.39 (*) 0.50 - 1.10 mg/dL   Calcium 8.7  8.4 - 10.5 mg/dL   GFR calc non Af  Amer >90  >90 mL/min   GFR calc Af Amer >90  >90 mL/min   Anion gap 13  5 - 15  GLUCOSE, CAPILLARY     Status: Abnormal   Collection Time    12/27/13  2:17 PM      Result Value Ref Range   Glucose-Capillary 139 (*) 70 - 99 mg/dL  GLUCOSE, CAPILLARY     Status: Abnormal   Collection Time    12/27/13  4:33 PM      Result Value Ref Range   Glucose-Capillary 187 (*) 70 - 99 mg/dL  GLUCOSE, CAPILLARY     Status: Abnormal   Collection Time    12/27/13  8:34 PM      Result Value Ref Range   Glucose-Capillary 241 (*) 70 - 99 mg/dL  GLUCOSE, CAPILLARY     Status: Abnormal   Collection Time    12/27/13 11:53 PM      Result Value Ref Range   Glucose-Capillary 219 (*) 70 - 99 mg/dL  GLUCOSE, CAPILLARY     Status: Abnormal   Collection Time    12/28/13  3:54 AM      Result Value Ref Range   Glucose-Capillary 102 (*) 70 - 99 mg/dL  BASIC METABOLIC PANEL     Status: Abnormal   Collection Time    12/28/13  4:30 AM      Result Value Ref Range   Sodium 138  137 - 147 mEq/L   Potassium 4.1  3.7 - 5.3 mEq/L   Chloride 104  96 - 112 mEq/L   CO2 23  19 - 32 mEq/L   Glucose, Bld 87  70 - 99 mg/dL   BUN 7  6 - 23 mg/dL   Creatinine, Ser 0.42 (*) 0.50 - 1.10 mg/dL   Calcium 9.0  8.4 - 10.5 mg/dL   GFR calc non Af Amer >90  >90 mL/min   GFR calc Af Amer >90  >90 mL/min   Anion gap 11  5 - 15  GLUCOSE, CAPILLARY     Status: Abnormal   Collection Time    12/28/13  7:38 AM      Result Value Ref Range   Glucose-Capillary 103 (*) 70 - 99 mg/dL  GLUCOSE, CAPILLARY     Status: Abnormal   Collection Time     12/28/13 11:38 AM      Result Value Ref Range   Glucose-Capillary 275 (*) 70 - 99 mg/dL   Signed: Arman Filter, MD 12/28/2013, 11:46 AM   Services Ordered on Discharge: None. Equipment Ordered on Discharge: None.

## 2013-12-28 NOTE — Progress Notes (Signed)
Info given  For follow up appt and importance of seeing MD  And keeping  appts   in both clinics.Patient verbalized  Understanding.

## 2013-12-31 ENCOUNTER — Encounter: Payer: Self-pay | Admitting: Internal Medicine

## 2014-02-20 ENCOUNTER — Emergency Department (HOSPITAL_COMMUNITY)
Admission: EM | Admit: 2014-02-20 | Discharge: 2014-02-20 | Disposition: A | Discharge status: Home / Self Care | Payer: Medicaid Other | Attending: Emergency Medicine | Admitting: Emergency Medicine

## 2014-02-20 ENCOUNTER — Encounter (HOSPITAL_COMMUNITY): Payer: Self-pay | Admitting: Emergency Medicine

## 2014-02-20 DIAGNOSIS — Y929 Unspecified place or not applicable: Secondary | ICD-10-CM | POA: Insufficient documentation

## 2014-02-20 DIAGNOSIS — L0231 Cutaneous abscess of buttock: Secondary | ICD-10-CM | POA: Insufficient documentation

## 2014-02-20 DIAGNOSIS — Z794 Long term (current) use of insulin: Secondary | ICD-10-CM | POA: Insufficient documentation

## 2014-02-20 DIAGNOSIS — L0291 Cutaneous abscess, unspecified: Secondary | ICD-10-CM

## 2014-02-20 DIAGNOSIS — Y939 Activity, unspecified: Secondary | ICD-10-CM | POA: Insufficient documentation

## 2014-02-20 DIAGNOSIS — Z880 Allergy status to penicillin: Secondary | ICD-10-CM | POA: Insufficient documentation

## 2014-02-20 DIAGNOSIS — W57XXXA Bitten or stung by nonvenomous insect and other nonvenomous arthropods, initial encounter: Secondary | ICD-10-CM | POA: Insufficient documentation

## 2014-02-20 DIAGNOSIS — E119 Type 2 diabetes mellitus without complications: Secondary | ICD-10-CM | POA: Insufficient documentation

## 2014-02-20 DIAGNOSIS — Z8619 Personal history of other infectious and parasitic diseases: Secondary | ICD-10-CM | POA: Insufficient documentation

## 2014-02-20 NOTE — ED Notes (Signed)
She states 'it looks like theres a spider bite on my butt." c/o painful sore to L buttock onset yesterday.

## 2014-02-20 NOTE — ED Notes (Addendum)
She refuses CBG, states that is not why she is here and does not want to be here any longer than she needs to

## 2014-02-20 NOTE — ED Provider Notes (Signed)
CSN: [id number]     Arrival date & time 02/20/14  1334 History   First MD Initiated Contact with Patient 02/20/14 1607     Chief Complaint  Patient presents with  . Wound Infection     (Consider location/radiation/quality/duration/timing/severity/associated sxs/prior Treatment) HPI Prt is a 23yo female presenting to ED with c/o "spide bite" on her left buttock that she noticed yesterday.  States area is itching and sore, 8/10, constant, no medication PTA. Denies fever, n/v/d. No hx of same. No other lesions.   Past Medical History  Diagnosis Date  . Gonorrhea 08/2011    Treated in 09/2011  . Diabetes mellitus 2001    Diagnosed at age 66 ; Type I  . DKA (diabetic ketoacidoses) 08/19/2013   Past Surgical History  Procedure Laterality Date  . No past surgeries    . Incision and drainage perirectal abscess Right 08/18/2013    Procedure: IRRIGATION AND DEBRIDEMENT GLUTEAL ABSCESS;  Surgeon: Ralene Ok, MD;  Location: [place];  Service: General;  Laterality: Right;  . Incision and drainage perirectal abscess Right 09/19/2013    Procedure: IRRIGATION AND DEBRIDEMENT RIGHT GLUTEAL AND LABIAL ABSCESSES;  Surgeon: Ralene Ok, MD;  Location: [place];  Service: General;  Laterality: Right;  . Incision and drainage perirectal abscess Right 09/24/2013    Procedure: IRRIGATION AND DEBRIDEMENT PERIRECTAL ABSCESS;  Surgeon: Gwenyth Ober, MD;  Location: [place];  Service: General;  Laterality: Right;   Family History  Problem Relation Age of Onset  . Anesthesia problems Neg Hx   . Asthma Mother   . Gout Father   . Diabetes Paternal Grandmother    History  Substance Use Topics  . Smoking status: Never Smoker   . Smokeless tobacco: Never Used  . Alcohol Use: No   OB History   Grav Para Term Preterm Abortions TAB SAB Ect Mult Living   1 0   1  1   0     Review of Systems  Constitutional: Negative for fever and chills.  Gastrointestinal: Negative for nausea, vomiting and abdominal pain.   Skin: Positive for wound. Negative for color change and rash.  All other systems reviewed and are negative.     Allergies  Penicillins  Home Medications   Prior to Admission medications   Medication Sig Start Date End Date Taking? Authorizing Provider  insulin aspart (NOVOLOG) 100 UNIT/ML injection Inject 2-10 Units into the skin 2 (two) times daily as needed for high blood sugar (CBG over 100). Per sliding scale   Yes Historical Provider, MD  Insulin Glargine (LANTUS SOLOSTAR) 100 UNIT/ML Solostar Pen Inject 20 Units into the skin 2 (two) times daily. Breakfast and dinner   Yes Historical Provider, MD   BP 129/89  Pulse 115  Temp(Src) 97.4 F (36.3 C) (Oral)  Resp 16  Ht 5\' 3"  (1.6 m)  Wt 114 lb (51.71 kg)  BMI 20.20 kg/m2  SpO2 99% Physical Exam  Nursing note and vitals reviewed. Constitutional: She is oriented to person, place, and time. She appears well-developed and well-nourished.  HENT:  Head: Normocephalic and atraumatic.  Eyes: EOM are normal.  Neck: Normal range of motion.  Cardiovascular: Normal rate.   Pulmonary/Chest: Effort normal.  Musculoskeletal: Normal range of motion.  Neurological: She is alert and oriented to person, place, and time.  Skin: Skin is warm and dry. There is erythema.  Left buttock: 0.5cm circular bullae minimal fluctuance mild erythema. Tender to touch. No induration. No active discharge  Psychiatric: She has a normal mood and affect. Her behavior is normal.    ED Course  Procedures   INCISION AND DRAINAGE Performed by: Noland Fordyce A. Consent: Verbal consent obtained. Risks and benefits: risks, benefits and alternatives were discussed Type: abscess  Body area: left buttock  Anesthesia: none  Incision was made with 18gtt needle  Local anesthetic: none  Complexity:simple  Drainage: purulent  Drainage amount:  scant  Packing material: none  Patient tolerance: Patient tolerated the procedure well with no  immediate complications.     Labs Review Labs Reviewed - No data to display  Imaging Review No results found.   EKG Interpretation None      MDM   Final diagnoses:  Abscess   Small simple abscess on left buttock, area I&D while in ED w/o immediate complication. Home care instructions provided. Return precautions provided. Pt verbalized understanding and agreement with tx plan.    Noland Fordyce, PA-C 02/20/14 [phone]

## 2014-02-21 NOTE — ED Provider Notes (Signed)
Medical screening examination/treatment/procedure(s) were performed by non-physician practitioner and as supervising physician I was immediately available for consultation/collaboration.   EKG Interpretation None        Pamella Pert, MD 02/21/14 1131

## 2014-02-23 ENCOUNTER — Encounter (HOSPITAL_COMMUNITY): Payer: Self-pay | Admitting: Emergency Medicine

## 2014-02-23 ENCOUNTER — Emergency Department (HOSPITAL_COMMUNITY)
Admission: EM | Admit: 2014-02-23 | Discharge: 2014-02-24 | Disposition: A | Discharge status: Home / Self Care | Payer: Medicaid Other | Attending: Emergency Medicine | Admitting: Emergency Medicine

## 2014-02-23 DIAGNOSIS — E1010 Type 1 diabetes mellitus with ketoacidosis without coma: Secondary | ICD-10-CM | POA: Insufficient documentation

## 2014-02-23 DIAGNOSIS — R739 Hyperglycemia, unspecified: Secondary | ICD-10-CM

## 2014-02-23 DIAGNOSIS — Z8619 Personal history of other infectious and parasitic diseases: Secondary | ICD-10-CM | POA: Insufficient documentation

## 2014-02-23 DIAGNOSIS — Z794 Long term (current) use of insulin: Secondary | ICD-10-CM | POA: Insufficient documentation

## 2014-02-23 DIAGNOSIS — Z880 Allergy status to penicillin: Secondary | ICD-10-CM | POA: Insufficient documentation

## 2014-02-23 DIAGNOSIS — L0231 Cutaneous abscess of buttock: Secondary | ICD-10-CM

## 2014-02-23 LAB — COMPREHENSIVE METABOLIC PANEL
ALT: 13 U/L (ref 0–35)
AST: 18 U/L (ref 0–37)
Albumin: 3.5 g/dL (ref 3.5–5.2)
Alkaline Phosphatase: 72 U/L (ref 39–117)
Anion gap: 21 — ABNORMAL HIGH (ref 5–15)
BUN: 17 mg/dL (ref 6–23)
CHLORIDE: 88 meq/L — AB (ref 96–112)
CO2: 20 meq/L (ref 19–32)
CREATININE: 0.7 mg/dL (ref 0.50–1.10)
Calcium: 9.2 mg/dL (ref 8.4–10.5)
GLUCOSE: 555 mg/dL — AB (ref 70–99)
Potassium: 4 mEq/L (ref 3.7–5.3)
Sodium: 129 mEq/L — ABNORMAL LOW (ref 137–147)
Total Bilirubin: 0.2 mg/dL — ABNORMAL LOW (ref 0.3–1.2)
Total Protein: 6.9 g/dL (ref 6.0–8.3)

## 2014-02-23 LAB — CBC
HCT: 35.6 % — ABNORMAL LOW (ref 36.0–46.0)
HEMOGLOBIN: 12.5 g/dL (ref 12.0–15.0)
MCH: 27.7 pg (ref 26.0–34.0)
MCHC: 35.1 g/dL (ref 30.0–36.0)
MCV: 78.9 fL (ref 78.0–100.0)
PLATELETS: 307 10*3/uL (ref 150–400)
RBC: 4.51 MIL/uL (ref 3.87–5.11)
RDW: 14.3 % (ref 11.5–15.5)
WBC: 9.7 10*3/uL (ref 4.0–10.5)

## 2014-02-23 LAB — CBG MONITORING, ED
GLUCOSE-CAPILLARY: 432 mg/dL — AB (ref 70–99)
Glucose-Capillary: 569 mg/dL (ref 70–99)

## 2014-02-23 MED ORDER — INSULIN ASPART 100 UNIT/ML ~~LOC~~ SOLN
10.0000 [IU] | Freq: Once | SUBCUTANEOUS | Status: AC
  Administered 2014-02-23: 10 [IU] via INTRAVENOUS
  Filled 2014-02-23: qty 1

## 2014-02-23 MED ORDER — OXYCODONE-ACETAMINOPHEN 5-325 MG PO TABS
1.0000 | ORAL_TABLET | Freq: Once | ORAL | Status: AC
  Administered 2014-02-23: 1 via ORAL
  Filled 2014-02-23: qty 1

## 2014-02-23 MED ORDER — LIDOCAINE HCL (PF) 1 % IJ SOLN
5.0000 mL | Freq: Once | INTRAMUSCULAR | Status: AC
  Administered 2014-02-23: 5 mL
  Filled 2014-02-23: qty 5

## 2014-02-23 MED ORDER — SODIUM CHLORIDE 0.9 % IV BOLUS (SEPSIS)
1000.0000 mL | Freq: Once | INTRAVENOUS | Status: AC
  Administered 2014-02-23: 1000 mL via INTRAVENOUS

## 2014-02-23 MED ORDER — SULFAMETHOXAZOLE-TRIMETHOPRIM 800-160 MG PO TABS: 1.0000 | ORAL_TABLET | Freq: Two times a day (BID) | ORAL | Status: AC

## 2014-02-23 NOTE — ED Provider Notes (Signed)
CSN: [id number]     Arrival date & time 02/23/14  1825 History  This chart was scribed for non-physician practitioner Etta Quill, NP working with Dot Lanes, MD by Lora Havens, ED Scribe. This patient was seen in TR11C/TR11C and the patient's care was started at 8:11 PM.     Chief Complaint  Patient presents with  . Abscess   Patient is a 23 y.o. female presenting with abscess. The history is provided by the patient. No language interpreter was used.  Abscess Location:  Ano-genital Ano-genital abscess location:  L buttock Size:  1 by 1 cm Abscess quality: painful and redness   Duration:  4 days Progression:  Worsening Pain details:    Quality:  Sharp   Severity:  Mild   Duration:  4 days   Progression:  Worsening Chronicity:  New Context: diabetes   Relieved by:  Nothing Worsened by:  Nothing tried Ineffective treatments:  None tried  HPI Comments: Ashley Freeman is a 23 y.o. female with a history of DM who presents to the Emergency Department complaining of of painful bump on her left buttock. She notes coming in 4 days ago for what she thought was a spider bite but she was told it was a hair bump. Pt says the area has grown larger and increased with pain. She is unsure if there is any drainage. She takes Novolog twice a dayfor her DM. Her PCP is Dr. Algernon Huxley. Past Medical History  Diagnosis Date  . Gonorrhea 08/2011    Treated in 09/2011  . Diabetes mellitus 2001    Diagnosed at age 53 ; Type I  . DKA (diabetic ketoacidoses) 08/19/2013   Past Surgical History  Procedure Laterality Date  . No past surgeries    . Incision and drainage perirectal abscess Right 08/18/2013    Procedure: IRRIGATION AND DEBRIDEMENT GLUTEAL ABSCESS;  Surgeon: Ralene Ok, MD;  Location: [employer];  Service: General;  Laterality: Right;  . Incision and drainage perirectal abscess Right 09/19/2013    Procedure: IRRIGATION AND DEBRIDEMENT RIGHT GLUTEAL AND LABIAL ABSCESSES;  Surgeon:  Ralene Ok, MD;  Location: [place];  Service: General;  Laterality: Right;  . Incision and drainage perirectal abscess Right 09/24/2013    Procedure: IRRIGATION AND DEBRIDEMENT PERIRECTAL ABSCESS;  Surgeon: Gwenyth Ober, MD;  Location: [place];  Service: General;  Laterality: Right;   Family History  Problem Relation Age of Onset  . Anesthesia problems Neg Hx   . Asthma Mother   . Gout Father   . Diabetes Paternal Grandmother    History  Substance Use Topics  . Smoking status: Never Smoker   . Smokeless tobacco: Never Used  . Alcohol Use: No   OB History   Grav Para Term Preterm Abortions TAB SAB Ect Mult Living   1 0   1  1   0     Review of Systems  Skin: Positive for color change and wound.  All other systems reviewed and are negative.     Allergies  Penicillins  Home Medications   Prior to Admission medications   Medication Sig Start Date End Date Taking? Authorizing Provider  insulin aspart (NOVOLOG) 100 UNIT/ML injection Inject 2-10 Units into the skin 2 (two) times daily as needed for high blood sugar (CBG over 100). Per sliding scale    Historical Provider, MD  Insulin Glargine (LANTUS SOLOSTAR) 100 UNIT/ML Solostar Pen Inject 20 Units into the skin 2 (two) times daily. Breakfast and dinner  Historical Provider, MD   BP 131/83  Pulse 120  Temp(Src) 98.3 F (36.8 C) (Oral)  Resp 18  SpO2 100%  LMP 02/02/2014 Physical Exam  Nursing note and vitals reviewed. Constitutional: She is oriented to person, place, and time. She appears well-developed and well-nourished. No distress.  HENT:  Head: Normocephalic and atraumatic.  Eyes: EOM are normal.  Neck: Normal range of motion.  Cardiovascular: Normal rate.   Pulmonary/Chest: Effort normal.  Neurological: She is alert and oriented to person, place, and time.  Skin: Skin is warm and dry.  On the left buttock is a 1 by 1 cm erythematous lesion   Psychiatric: She has a normal mood and affect. Her behavior is  normal.    ED Course  Procedures   INCISION AND DRAINAGE Performed by: Norman Herrlich Consent: Verbal consent obtained. Risks and benefits: risks, benefits and alternatives were discussed Type: abscess  Body area: left buttock  Anesthesia: local infiltration  Incision was made with a scalpel.  Local anesthetic: lidocaine 1%  Anesthetic total: 3 ml  Complexity: complex  Blunt dissection to break up loculations  Drainage: purulent  Drainage amount: scant  Patient tolerance: Patient tolerated the procedure well with no immediate complications.   DIAGNOSTIC STUDIES: Oxygen Saturation is 100% on room air, normal by my interpretation.    COORDINATION OF CARE: 8:15 PM Discussed treatment plan with pt at bedside and pt agreed to plan.  Labs Review Labs Reviewed - No data to display  Imaging Review No results found.   EKG Interpretation None     History of poorly controlled diabetes. Checked CBG at request of patient--found to by hyperglycemic at 569, confirmed by lab testing, with anion gap of 21. Patient received two liters of NS and 10 units of insulin IV, with blood sugar improving to 211.  Will repeat BMP to reassess gap--if improved, will likely discharge home.  Patient discussed with Dr. Stevie Kern. MDM   Final diagnoses:  None  I personally performed the services described in this documentation, which was scribed in my presence. The recorded information has been reviewed and is accurate.    Abscess left buttock. Hyperglycemia    Norman Herrlich, NP 02/24/14 [phone]

## 2014-02-23 NOTE — ED Notes (Signed)
Pt reports "bump" to left upper thigh x 4 days. States she was seen here for same and told it was a "hair bump." Pt reports area has grown larger with increased pain. Unsure if she has had drainage.

## 2014-02-23 NOTE — Discharge Instructions (Signed)

## 2014-02-24 LAB — BASIC METABOLIC PANEL
Anion gap: 15 (ref 5–15)
BUN: 12 mg/dL (ref 6–23)
CHLORIDE: 102 meq/L (ref 96–112)
CO2: 20 mEq/L (ref 19–32)
CREATININE: 0.53 mg/dL (ref 0.50–1.10)
Calcium: 8.6 mg/dL (ref 8.4–10.5)
GFR calc Af Amer: >90 mL/min (ref >90)
GFR calc non Af Amer: >90 mL/min (ref >90)
Glucose, Bld: 167 mg/dL — ABNORMAL HIGH (ref 70–99)
Potassium: 3 mEq/L — ABNORMAL LOW (ref 3.7–5.3)
Sodium: 137 mEq/L (ref 137–147)

## 2014-02-24 LAB — CBG MONITORING, ED: Glucose-Capillary: 211 mg/dL — ABNORMAL HIGH (ref 70–99)

## 2014-02-24 MED ORDER — HYDROCODONE-ACETAMINOPHEN 5-325 MG PO TABS: 1.0000 | ORAL_TABLET | ORAL | Status: DC | PRN

## 2014-02-24 NOTE — ED Notes (Signed)
Dc iv left ac 22g, cath intact

## 2014-02-25 NOTE — ED Provider Notes (Signed)
Medical screening examination/treatment/procedure(s) were performed by non-physician practitioner and as supervising physician I was immediately available for consultation/collaboration.   Dot Lanes, MD 02/25/14 1350

## 2014-03-01 ENCOUNTER — Encounter (HOSPITAL_COMMUNITY): Payer: Self-pay | Admitting: Emergency Medicine

## 2014-03-09 ENCOUNTER — Encounter: Payer: Self-pay | Admitting: *Deleted

## 2014-04-06 ENCOUNTER — Inpatient Hospital Stay (HOSPITAL_COMMUNITY)
Admission: EM | Admit: 2014-04-06 | Discharge: 2014-04-08 | DRG: 758 | Disposition: A | Discharge status: Home / Self Care | Payer: Self-pay | Attending: Obstetrics and Gynecology | Admitting: Obstetrics and Gynecology

## 2014-04-06 ENCOUNTER — Encounter (HOSPITAL_COMMUNITY): Payer: Self-pay

## 2014-04-06 ENCOUNTER — Other Ambulatory Visit: Payer: Self-pay | Admitting: Obstetrics and Gynecology

## 2014-04-06 ENCOUNTER — Emergency Department (HOSPITAL_COMMUNITY): Payer: Medicaid Other

## 2014-04-06 DIAGNOSIS — R000 Tachycardia, unspecified: Secondary | ICD-10-CM

## 2014-04-06 DIAGNOSIS — E108 Type 1 diabetes mellitus with unspecified complications: Secondary | ICD-10-CM

## 2014-04-06 DIAGNOSIS — Z880 Allergy status to penicillin: Secondary | ICD-10-CM

## 2014-04-06 DIAGNOSIS — N390 Urinary tract infection, site not specified: Secondary | ICD-10-CM | POA: Diagnosis present

## 2014-04-06 DIAGNOSIS — O2341 Unspecified infection of urinary tract in pregnancy, first trimester: Secondary | ICD-10-CM | POA: Clinically undetermined

## 2014-04-06 DIAGNOSIS — E1065 Type 1 diabetes mellitus with hyperglycemia: Secondary | ICD-10-CM | POA: Diagnosis present

## 2014-04-06 DIAGNOSIS — N730 Acute parametritis and pelvic cellulitis: Principal | ICD-10-CM | POA: Diagnosis present

## 2014-04-06 DIAGNOSIS — IMO0002 Reserved for concepts with insufficient information to code with codable children: Secondary | ICD-10-CM

## 2014-04-06 DIAGNOSIS — Z794 Long term (current) use of insulin: Secondary | ICD-10-CM

## 2014-04-06 DIAGNOSIS — E109 Type 1 diabetes mellitus without complications: Secondary | ICD-10-CM | POA: Diagnosis present

## 2014-04-06 LAB — COMPREHENSIVE METABOLIC PANEL
ALT: 10 U/L (ref 0–35)
ANION GAP: 15 (ref 5–15)
AST: 15 U/L (ref 0–37)
Albumin: 3.6 g/dL (ref 3.5–5.2)
Alkaline Phosphatase: 73 U/L (ref 39–117)
BUN: 11 mg/dL (ref 6–23)
CALCIUM: 10 mg/dL (ref 8.4–10.5)
CO2: 23 mEq/L (ref 19–32)
CREATININE: 0.53 mg/dL (ref 0.50–1.10)
Chloride: 97 mEq/L (ref 96–112)
GFR calc non Af Amer: >90 mL/min (ref >90)
GLUCOSE: 304 mg/dL — AB (ref 70–99)
Potassium: 3.7 mEq/L (ref 3.7–5.3)
Sodium: 135 mEq/L — ABNORMAL LOW (ref 137–147)
TOTAL PROTEIN: 8 g/dL (ref 6.0–8.3)
Total Bilirubin: <0.2 mg/dL — ABNORMAL LOW (ref 0.3–1.2)

## 2014-04-06 LAB — CBC WITH DIFFERENTIAL/PLATELET
Basophils Absolute: 0 K/uL (ref 0.0–0.1)
Basophils Relative: 0 % (ref 0–1)
Eosinophils Absolute: 0.2 K/uL (ref 0.0–0.7)
Eosinophils Relative: 2 % (ref 0–5)
HCT: 40.6 % (ref 36.0–46.0)
Hemoglobin: 14.5 g/dL (ref 12.0–15.0)
Lymphocytes Relative: 41 % (ref 12–46)
Lymphs Abs: 2.5 K/uL (ref 0.7–4.0)
MCH: 28.8 pg (ref 26.0–34.0)
MCHC: 35.7 g/dL (ref 30.0–36.0)
MCV: 80.6 fL (ref 78.0–100.0)
Monocytes Absolute: 0.5 K/uL (ref 0.1–1.0)
Monocytes Relative: 8 % (ref 3–12)
Neutro Abs: 3 K/uL (ref 1.7–7.7)
Neutrophils Relative %: 49 % (ref 43–77)
Platelets: 332 K/uL (ref 150–400)
RBC: 5.04 MIL/uL (ref 3.87–5.11)
RDW: 12.2 % (ref 11.5–15.5)
WBC: 6.2 K/uL (ref 4.0–10.5)

## 2014-04-06 LAB — GLUCOSE, CAPILLARY
GLUCOSE-CAPILLARY: 338 mg/dL — AB (ref 70–99)
Glucose-Capillary: 374 mg/dL — ABNORMAL HIGH (ref 70–99)

## 2014-04-06 LAB — BASIC METABOLIC PANEL
Anion gap: 16 — ABNORMAL HIGH (ref 5–15)
BUN: 10 mg/dL (ref 6–23)
CO2: 22 mEq/L (ref 19–32)
Calcium: 9.7 mg/dL (ref 8.4–10.5)
Chloride: 96 mEq/L (ref 96–112)
Creatinine, Ser: 0.5 mg/dL (ref 0.50–1.10)
GFR calc non Af Amer: >90 mL/min (ref >90)
Glucose, Bld: 265 mg/dL — ABNORMAL HIGH (ref 70–99)
POTASSIUM: 3.7 meq/L (ref 3.7–5.3)
SODIUM: 134 meq/L — AB (ref 137–147)

## 2014-04-06 LAB — URINALYSIS, ROUTINE W REFLEX MICROSCOPIC
Bilirubin Urine: NEGATIVE
Glucose, UA: >1000 mg/dL — AB
Ketones, ur: NEGATIVE mg/dL
Nitrite: NEGATIVE
Protein, ur: 30 mg/dL — AB
Specific Gravity, Urine: 1.027 (ref 1.005–1.030)
Urobilinogen, UA: 0.2 mg/dL (ref 0.0–1.0)
pH: 5.5 (ref 5.0–8.0)

## 2014-04-06 LAB — POC URINE PREG, ED: Preg Test, Ur: NEGATIVE

## 2014-04-06 LAB — KETONES, QUALITATIVE: ACETONE BLD: NEGATIVE

## 2014-04-06 LAB — WET PREP, GENITAL
TRICH WET PREP: NONE SEEN
Yeast Wet Prep HPF POC: NONE SEEN

## 2014-04-06 LAB — LIPASE, BLOOD: Lipase: 12 U/L (ref 11–59)

## 2014-04-06 LAB — CBG MONITORING, ED
Glucose-Capillary: 297 mg/dL — ABNORMAL HIGH (ref 70–99)
Glucose-Capillary: 232 mg/dL — ABNORMAL HIGH (ref 70–99)

## 2014-04-06 LAB — D-DIMER, QUANTITATIVE (NOT AT ARMC)

## 2014-04-06 LAB — RPR

## 2014-04-06 LAB — URINE MICROSCOPIC-ADD ON

## 2014-04-06 LAB — TROPONIN I: Troponin I: <0.3 ng/mL (ref <0.30)

## 2014-04-06 LAB — HIV ANTIBODY (ROUTINE TESTING W REFLEX): HIV: NONREACTIVE

## 2014-04-06 MED ORDER — SODIUM CHLORIDE 0.9 % IV SOLN
INTRAVENOUS | Status: DC
Start: 2014-04-06 — End: 2014-04-08
  Administered 2014-04-06 – 2014-04-08 (×3): via INTRAVENOUS

## 2014-04-06 MED ORDER — IOHEXOL 300 MG/ML  SOLN
80.0000 mL | Freq: Once | INTRAMUSCULAR | Status: AC | PRN
  Administered 2014-04-06: 80 mL via INTRAVENOUS

## 2014-04-06 MED ORDER — ONDANSETRON HCL 4 MG/2ML IJ SOLN: 4.0000 mg | Freq: Four times a day (QID) | INTRAMUSCULAR | Status: DC | PRN

## 2014-04-06 MED ORDER — INSULIN GLARGINE 100 UNIT/ML ~~LOC~~ SOLN
20.0000 [IU] | Freq: Two times a day (BID) | SUBCUTANEOUS | Status: DC
  Administered 2014-04-06: 20 [IU] via SUBCUTANEOUS
  Filled 2014-04-06 (×2): qty 0.2

## 2014-04-06 MED ORDER — CLINDAMYCIN PHOSPHATE 900 MG/50ML IV SOLN: 900.0000 mg | Freq: Three times a day (TID) | INTRAVENOUS | Status: DC

## 2014-04-06 MED ORDER — IBUPROFEN 600 MG PO TABS
600.0000 mg | ORAL_TABLET | Freq: Four times a day (QID) | ORAL | Status: DC | PRN
  Administered 2014-04-06 – 2014-04-08 (×4): 600 mg via ORAL
  Filled 2014-04-06 (×4): qty 1

## 2014-04-06 MED ORDER — SODIUM CHLORIDE 0.9 % IV BOLUS (SEPSIS)
500.0000 mL | Freq: Once | INTRAVENOUS | Status: AC
  Administered 2014-04-06: 500 mL via INTRAVENOUS

## 2014-04-06 MED ORDER — GENTAMICIN SULFATE 40 MG/ML IJ SOLN
Freq: Three times a day (TID) | INTRAVENOUS | Status: DC
  Administered 2014-04-06 – 2014-04-08 (×5): via INTRAVENOUS
  Filled 2014-04-06 (×7): qty 2.75

## 2014-04-06 MED ORDER — OXYCODONE-ACETAMINOPHEN 5-325 MG PO TABS
1.0000 | ORAL_TABLET | ORAL | Status: DC | PRN
  Filled 2014-04-06: qty 1

## 2014-04-06 MED ORDER — INSULIN ASPART 100 UNIT/ML ~~LOC~~ SOLN
1.0000 [IU] | Freq: Three times a day (TID) | SUBCUTANEOUS | Status: DC
  Administered 2014-04-06: 9 [IU] via SUBCUTANEOUS
  Administered 2014-04-07: 5 [IU] via SUBCUTANEOUS
  Administered 2014-04-07: 9 [IU] via SUBCUTANEOUS
  Administered 2014-04-07: 15:00:00 via SUBCUTANEOUS

## 2014-04-06 MED ORDER — INSULIN ASPART 100 UNIT/ML ~~LOC~~ SOLN
10.0000 [IU] | Freq: Once | SUBCUTANEOUS | Status: AC
  Administered 2014-04-07: 10 [IU] via SUBCUTANEOUS

## 2014-04-06 MED ORDER — IOHEXOL 300 MG/ML  SOLN
50.0000 mL | Freq: Once | INTRAMUSCULAR | Status: AC | PRN
  Administered 2014-04-06: 50 mL via ORAL

## 2014-04-06 MED ORDER — PRENATAL MULTIVITAMIN CH
1.0000 | ORAL_TABLET | Freq: Every day | ORAL | Status: DC
  Administered 2014-04-07: 1 via ORAL
  Filled 2014-04-06: qty 1

## 2014-04-06 MED ORDER — CLINDAMYCIN PHOSPHATE 600 MG/50ML IV SOLN
600.0000 mg | Freq: Once | INTRAVENOUS | Status: AC
  Administered 2014-04-06: 600 mg via INTRAVENOUS
  Filled 2014-04-06: qty 50

## 2014-04-06 MED ORDER — ONDANSETRON HCL 4 MG PO TABS: 4.0000 mg | ORAL_TABLET | Freq: Four times a day (QID) | ORAL | Status: DC | PRN

## 2014-04-06 NOTE — ED Notes (Signed)
[facility] here to transport pt to [facility].

## 2014-04-06 NOTE — H&P (Signed)
Ashley Freeman is an 23 y.o. female G1P0010 presented to ED with complaints of a 1-week history of lower back pain and a 4-day history of worsening abdominal pain. Patient describes the pain as achy in nature localized to the right side of her lower abdomen. She reports some nausea and fevers as high to 101 at home.Patient is sexually active using condoms seldomly. She is currently in a same-sex relationship and denies sharing sex toys.  Pertinent Gynecological History: Menses: flow is light Bleeding: monthly Contraception: none DES exposure: denies Blood transfusions: none Sexually transmitted diseases: past history: gonorrhea Previous GYN Procedures: DNC  Last mammogram: n/a OB History: G1, P0010   Menstrual History: Patient's last menstrual period was 03/19/2014 (approximate).    Past Medical History  Diagnosis Date  . Gonorrhea 08/2011    Treated in 09/2011  . Diabetes mellitus 2001    Diagnosed at age 56 ; Type I  . DKA (diabetic ketoacidoses) 08/19/2013    Past Surgical History  Procedure Laterality Date  . No past surgeries    . Incision and drainage perirectal abscess Right 08/18/2013    Procedure: IRRIGATION AND DEBRIDEMENT GLUTEAL ABSCESS;  Surgeon: Ralene Ok, MD;  Location: [place]  Service: General;  Laterality: Right;  . Incision and drainage perirectal abscess Right 09/19/2013    Procedure: IRRIGATION AND DEBRIDEMENT RIGHT GLUTEAL AND LABIAL ABSCESSES;  Surgeon: Ralene Ok, MD;  Location: [place];  Service: General;  Laterality: Right;  . Incision and drainage perirectal abscess Right 09/24/2013    Procedure: IRRIGATION AND DEBRIDEMENT PERIRECTAL ABSCESS;  Surgeon: Gwenyth Ober, MD;  Location: [place];  Service: General;  Laterality: Right;    Family History  Problem Relation Age of Onset  . Anesthesia problems Neg Hx   . Asthma Mother   . Gout Father   . Diabetes Paternal Grandmother     Social History:  reports that she has never smoked. She has  never used smokeless tobacco. She reports that she drinks alcohol. She reports that she does not use illicit drugs.  Allergies:  Allergies  Allergen Reactions  . Penicillins Hives    Prescriptions prior to admission  Medication Sig Dispense Refill Last Dose  . insulin aspart (NOVOLOG) 100 UNIT/ML injection Inject 2-10 Units into the skin 2 (two) times daily as needed for high blood sugar (CBG over 100). Per sliding scale   04/06/2014 at Unknown time  . Insulin Glargine (LANTUS SOLOSTAR) 100 UNIT/ML Solostar Pen Inject 20 Units into the skin 2 (two) times daily. Breakfast and dinner   04/06/2014 at Unknown time  . HYDROcodone-acetaminophen (NORCO) 5-325 MG per tablet Take 1 tablet by mouth every 4 (four) hours as needed for moderate pain or severe pain. (Patient not taking: Reported on 04/06/2014) 10 tablet 0     Review of Systems  All other systems reviewed and are negative.   Blood pressure 135/92, pulse 102, temperature 98.2 F (36.8 C), temperature source Oral, resp. rate 23, height 5\' 3"  (1.6 m), weight 110 lb (49.896 kg), last menstrual period 03/19/2014, SpO2 99 %. Physical Exam  GENERAL: Well-developed, well-nourished female in no acute distress.  HEENT: Normocephalic, atraumatic. Sclerae anicteric.  NECK: Supple. Normal thyroid.  LUNGS: Clear to auscultation bilaterally.  HEART: Regular rate and rhythm. ABDOMEN: Soft, tenderness in RLQ, no rebound, no guarding PELVIC: Normal external female genitalia. Vagina is pink and rugated.  Uterus is normal in size. Positive bilateral adnexal tenderness. EXTREMITIES: No cyanosis, clubbing, or edema, 2+ distal pulses.  Results for orders placed or performed during the hospital encounter of 04/06/14 (from the past 24 hour(s))  CBG monitoring, ED     Status: Abnormal   Collection Time: 04/06/14 11:48 AM  Result Value Ref Range   Glucose-Capillary 297 (H) 70 - 99 mg/dL  CBC with Differential     Status: None   Collection Time: 04/06/14   1:09 PM  Result Value Ref Range   WBC 6.2 4.0 - 10.5 K/uL   RBC 5.04 3.87 - 5.11 MIL/uL   Hemoglobin 14.5 12.0 - 15.0 g/dL   HCT 40.6 36.0 - 46.0 %   MCV 80.6 78.0 - 100.0 fL   MCH 28.8 26.0 - 34.0 pg   MCHC 35.7 30.0 - 36.0 g/dL   RDW 12.2 11.5 - 15.5 %   Platelets 332 150 - 400 K/uL   Neutrophils Relative % 49 43 - 77 %   Neutro Abs 3.0 1.7 - 7.7 K/uL   Lymphocytes Relative 41 12 - 46 %   Lymphs Abs 2.5 0.7 - 4.0 K/uL   Monocytes Relative 8 3 - 12 %   Monocytes Absolute 0.5 0.1 - 1.0 K/uL   Eosinophils Relative 2 0 - 5 %   Eosinophils Absolute 0.2 0.0 - 0.7 K/uL   Basophils Relative 0 0 - 1 %   Basophils Absolute 0.0 0.0 - 0.1 K/uL  Comprehensive metabolic panel     Status: Abnormal   Collection Time: 04/06/14  1:09 PM  Result Value Ref Range   Sodium 135 (L) 137 - 147 mEq/L   Potassium 3.7 3.7 - 5.3 mEq/L   Chloride 97 96 - 112 mEq/L   CO2 23 19 - 32 mEq/L   Glucose, Bld 304 (H) 70 - 99 mg/dL   BUN 11 6 - 23 mg/dL   Creatinine, Ser 0.53 0.50 - 1.10 mg/dL   Calcium 10.0 8.4 - 10.5 mg/dL   Total Protein 8.0 6.0 - 8.3 g/dL   Albumin 3.6 3.5 - 5.2 g/dL   AST 15 0 - 37 U/L   ALT 10 0 - 35 U/L   Alkaline Phosphatase 73 39 - 117 U/L   Total Bilirubin <0.2 (L) 0.3 - 1.2 mg/dL   GFR calc non Af Amer >90 >90 mL/min   GFR calc Af Amer >90 >90 mL/min   Anion gap 15 5 - 15  Lipase, blood     Status: None   Collection Time: 04/06/14  1:09 PM  Result Value Ref Range   Lipase 12 11 - 59 U/L  Troponin I     Status: None   Collection Time: 04/06/14  1:09 PM  Result Value Ref Range   Troponin I <0.30 <0.30 ng/mL  Urinalysis, Routine w reflex microscopic     Status: Abnormal   Collection Time: 04/06/14  1:52 PM  Result Value Ref Range   Color, Urine YELLOW YELLOW   APPearance TURBID (A) CLEAR   Specific Gravity, Urine 1.027 1.005 - 1.030   pH 5.5 5.0 - 8.0   Glucose, UA >1000 (A) NEGATIVE mg/dL   Hgb urine dipstick TRACE (A) NEGATIVE   Bilirubin Urine NEGATIVE NEGATIVE    Ketones, ur NEGATIVE NEGATIVE mg/dL   Protein, ur 30 (A) NEGATIVE mg/dL   Urobilinogen, UA 0.2 0.0 - 1.0 mg/dL   Nitrite NEGATIVE NEGATIVE   Leukocytes, UA SMALL (A) NEGATIVE  Urine microscopic-add on     Status: Abnormal   Collection Time: 04/06/14  1:52 PM  Result Value Ref Range  Squamous Epithelial / LPF MANY (A) RARE   WBC, UA 11-20 <3 WBC/hpf   RBC / HPF 0-2 <3 RBC/hpf   Bacteria, UA MANY (A) RARE  POC urine preg, ED (not at [facility])     Status: None   Collection Time: 04/06/14  1:58 PM  Result Value Ref Range   Preg Test, Ur NEGATIVE NEGATIVE  Ketones, qualitative     Status: None   Collection Time: 04/06/14  2:40 PM  Result Value Ref Range   Acetone, Bld NEGATIVE NEGATIVE  D-dimer, quantitative     Status: None   Collection Time: 04/06/14  2:40 PM  Result Value Ref Range   D-Dimer, Quant <0.27 0.00 - 0.48 ug/mL-FEU  Basic metabolic panel     Status: Abnormal   Collection Time: 04/06/14  4:47 PM  Result Value Ref Range   Sodium 134 (L) 137 - 147 mEq/L   Potassium 3.7 3.7 - 5.3 mEq/L   Chloride 96 96 - 112 mEq/L   CO2 22 19 - 32 mEq/L   Glucose, Bld 265 (H) 70 - 99 mg/dL   BUN 10 6 - 23 mg/dL   Creatinine, Ser 0.50 0.50 - 1.10 mg/dL   Calcium 9.7 8.4 - 10.5 mg/dL   GFR calc non Af Amer >90 >90 mL/min   GFR calc Af Amer >90 >90 mL/min   Anion gap 16 (H) 5 - 15  CBG monitoring, ED     Status: Abnormal   Collection Time: 04/06/14  4:48 PM  Result Value Ref Range   Glucose-Capillary 232 (H) 70 - 99 mg/dL  Wet prep, genital     Status: Abnormal   Collection Time: 04/06/14  5:09 PM  Result Value Ref Range   Yeast Wet Prep HPF POC NONE SEEN NONE SEEN   Trich, Wet Prep NONE SEEN NONE SEEN   Clue Cells Wet Prep HPF POC MODERATE (A) NONE SEEN   WBC, Wet Prep HPF POC FEW (A) NONE SEEN  Glucose, capillary     Status: Abnormal   Collection Time: 04/06/14  8:24 PM  Result Value Ref Range   Glucose-Capillary 374 (H) 70 - 99 mg/dL   Comment 1 Documented in Chart     Ct  Abdomen Pelvis W Contrast  04/06/2014   ADDENDUM REPORT: 04/06/2014 18:27  ADDENDUM: There is a normal caliber appendix in the right lower quadrant without periappendiceal inflammatory changes.   Electronically Signed   By: Kathreen Devoid   On: 04/06/2014 18:27   04/06/2014   CLINICAL DATA:  INCREASING GENERALIZED NECK PAIN AND UPPER ABDOMINAL PAIN FOR 1 WEEK  EXAM: CT ABDOMEN AND PELVIS WITH CONTRAST  TECHNIQUE: Multidetector CT imaging of the abdomen and pelvis was performed using the standard protocol following bolus administration of intravenous contrast.  CONTRAST:  85mL OMNIPAQUE IOHEXOL 300 MG/ML SOLN, 24mL OMNIPAQUE IOHEXOL 300 MG/ML SOLN  COMPARISON:  None.  FINDINGS: The lung bases are clear.  The liver demonstrates no focal abnormality. There is no intrahepatic or extrahepatic biliary ductal dilatation. The gallbladder is normal. The spleen demonstrates no focal abnormality. The kidneys, adrenal glands and pancreas are normal. The bladder is unremarkable.  The stomach, duodenum, small intestine, and large intestine demonstrate no contrast extravasation or dilatation. There is no pneumoperitoneum, pneumatosis, or portal venous gas. There is a small amount of pelvic free fluid. There is no lymphadenopathy.  There is high-density fluid present within the endometrial cavity to the level of the cervix.  The abdominal aorta is normal in  caliber .  There are no lytic or sclerotic osseous lesions.  IMPRESSION: 1. High density fluid within the endometrial cavity extending to the level of the cervix. This may be secondary to cervical stenosis or possible obstructing mass versus scarring. Recommend correlation with physical examination.  Electronically Signed: By: Kathreen Devoid On: 04/06/2014 15:57    Assessment/Plan: 23 yo admitted with suspected PID - Will admit for IV antibiotics given her nausea - Pain management as needed - continue diabetic management as per home meds   Linetta Regner 04/06/2014, 9:08  PM

## 2014-04-06 NOTE — Progress Notes (Signed)
ANTIBIOTIC CONSULT NOTE - INITIAL  Pharmacy Consult for Gentamicin Indication: PID  Allergies  Allergen Reactions  . Penicillins Hives    Patient Measurements: Height: 5\' 3"  (160 cm) Weight: 110 lb (49.896 kg) IBW/kg (Calculated) : 52.4   Vital Signs: Temp: 98.2 F (36.8 C) (12/08 1916) Temp Source: Oral (12/08 1916) BP: 135/92 mmHg (12/08 1812) Pulse Rate: 102 (12/08 1812) Intake/Output from previous day:   Intake/Output from this shift:    Labs:  Recent Labs  04/06/14 1309 04/06/14 1647  WBC 6.2  --   HGB 14.5  --   PLT 332  --   CREATININE 0.53 0.50   Estimated Creatinine Clearance: 87.6 mL/min (by C-G formula based on Cr of 0.5).   Microbiology: Recent Results (from the past 720 hour(s))  Wet prep, genital     Status: Abnormal   Collection Time: 04/06/14  5:09 PM  Result Value Ref Range Status   Yeast Wet Prep HPF POC NONE SEEN NONE SEEN Final   Trich, Wet Prep NONE SEEN NONE SEEN Final   Clue Cells Wet Prep HPF POC MODERATE (A) NONE SEEN Final   WBC, Wet Prep HPF POC FEW (A) NONE SEEN Final    Medical History: Past Medical History  Diagnosis Date  . Gonorrhea 08/2011    Treated in 09/2011  . Diabetes mellitus 2001    Diagnosed at age 29 ; Type I  . DKA (diabetic ketoacidoses) 08/19/2013    Medications:  Clindamycin 900mg  IV q8h Assessment: 23 yo F with + h/o gonorrhea admitted with abdominal and back pain x 7 days. Transferred to [facility] for PID treatment.  Goal of Therapy:  Gentamicin peaks 6-35mcg/ml and trough < 86mcg/ml  Plan:  1. Gentamicin 110mg  IV q8h. 2. Will continue to follow and draw Gentamicin levels as clinically indicated. Thanks!  Vernie Ammons 04/06/2014,9:03 PM

## 2014-04-06 NOTE — ED Provider Notes (Signed)
CSN: [id number]     Arrival date & time 04/06/14  1130 History   First MD Initiated Contact with Patient 04/06/14 1250     Chief Complaint  Patient presents with  . Abdominal Pain  . Back Pain     (Consider location/radiation/quality/duration/timing/severity/associated sxs/prior Treatment) The history is provided by the patient. No language interpreter was used.  Ashley Freeman is a 23 year old female with past medical history of gonorrhea, type 1 diabetes with frequent DKA episodes presenting to the ED with abdominal pain and back pain. Patient reported that the back pain is localized to lower back described as an aching sensation that is constant without radiation that has been ongoing for approximately one week with worsening symptoms over the course of 4 days. Patient reported that she started to have abdominal pain approximately 2 days ago localized to the entire abdomen described as a stabbing pain that is constant without radiation. Stated that eating and drinking makes the pain worse. Stated that she has not been taking any pain medications at home. Reported that she's been taking her scheduled medications as prescribed by physicians. stated that she is sexually active and at times uses protection. Denied vomiting, diarrhea, melena, hematochezia, hematuria, dysuria, fever, chills, neck pain, neck stiffness, diaphoresis, chest pain, shortness of breath, difficulty breathing, vaginal pain, vaginal discharge, abnormal vaginal bleeding. PCP Dr. Aundra Dubin  Past Medical History  Diagnosis Date  . Gonorrhea 08/2011    Treated in 09/2011  . Diabetes mellitus 2001    Diagnosed at age 35 ; Type I  . DKA (diabetic ketoacidoses) 08/19/2013   Past Surgical History  Procedure Laterality Date  . No past surgeries    . Incision and drainage perirectal abscess Right 08/18/2013    Procedure: IRRIGATION AND DEBRIDEMENT GLUTEAL ABSCESS;  Surgeon: Ralene Ok, MD;  Location: [place];  Service:  General;  Laterality: Right;  . Incision and drainage perirectal abscess Right 09/19/2013    Procedure: IRRIGATION AND DEBRIDEMENT RIGHT GLUTEAL AND LABIAL ABSCESSES;  Surgeon: Ralene Ok, MD;  Location: [place];  Service: General;  Laterality: Right;  . Incision and drainage perirectal abscess Right 09/24/2013    Procedure: IRRIGATION AND DEBRIDEMENT PERIRECTAL ABSCESS;  Surgeon: Gwenyth Ober, MD;  Location: [place];  Service: General;  Laterality: Right;   Family History  Problem Relation Age of Onset  . Anesthesia problems Neg Hx   . Asthma Mother   . Gout Father   . Diabetes Paternal Grandmother    History  Substance Use Topics  . Smoking status: Never Smoker   . Smokeless tobacco: Never Used  . Alcohol Use: Yes     Comment: occ   OB History    Gravida Para Term Preterm AB TAB SAB Ectopic Multiple Living   1 0   1  1   0     Review of Systems  Constitutional: Negative for fever and chills.  Eyes: Negative for visual disturbance.  Respiratory: Negative for chest tightness and shortness of breath.   Cardiovascular: Negative for chest pain.  Gastrointestinal: Positive for nausea and abdominal pain. Negative for vomiting, diarrhea, constipation, blood in stool and anal bleeding.  Genitourinary: Negative for dysuria, decreased urine volume, vaginal bleeding, vaginal discharge, vaginal pain and pelvic pain.  Musculoskeletal: Positive for back pain. Negative for neck pain and neck stiffness.  Neurological: Negative for dizziness, weakness, numbness and headaches.      Allergies  Penicillins  Home Medications   Prior to Admission medications   Medication  Sig Start Date End Date Taking? Authorizing Provider  insulin aspart (NOVOLOG) 100 UNIT/ML injection Inject 2-10 Units into the skin 2 (two) times daily as needed for high blood sugar (CBG over 100). Per sliding scale   Yes Historical Provider, MD  Insulin Glargine (LANTUS SOLOSTAR) 100 UNIT/ML Solostar Pen Inject 20 Units  into the skin 2 (two) times daily. Breakfast and dinner   Yes Historical Provider, MD  HYDROcodone-acetaminophen (NORCO) 5-325 MG per tablet Take 1 tablet by mouth every 4 (four) hours as needed for moderate pain or severe pain. Patient not taking: Reported on 04/06/2014 02/24/14   Babette Relic, MD   BP 135/92 mmHg  Pulse 102  Temp(Src) 97.5 F (36.4 C) (Oral)  Resp 23  SpO2 99%  LMP 03/19/2014 (Approximate) Physical Exam  Constitutional: She is oriented to person, place, and time. She appears well-developed and well-nourished. No distress.  HENT:  Head: Normocephalic and atraumatic.  Mouth/Throat: Oropharynx is clear and moist. No oropharyngeal exudate.  Eyes: Conjunctivae and EOM are normal. Pupils are equal, round, and reactive to light. Right eye exhibits no discharge. Left eye exhibits no discharge.  Neck: Normal range of motion. Neck supple. No tracheal deviation present.  Cardiovascular: Normal rate, regular rhythm and normal heart sounds.  Exam reveals no friction rub.   No murmur heard. Pulmonary/Chest: Effort normal and breath sounds normal. No respiratory distress. She has no wheezes. She has no rales.  Abdominal: Soft. Bowel sounds are normal. She exhibits no distension. There is tenderness. There is no rebound and no guarding.  Negative abdominal distention Bowel sounds normal active in all 4 quadrants Abdomen soft upon palpation Diffuse tenderness upon palpation to the abdomen Positive guarding during exam-voluntary  Genitourinary:  Pelvic Exam: Negative swelling, erythema, inflammation, lesions, sores, deformities, abscess identified to the external region. Negative active bleeding noted - negative blood in the vaginal vault. Thick white discharge noted. Negative friability of the cervix noted. CMT positive. Negative adnexal tenderness bilaterally. Exam chaperoned with tech, Lurline Idol  Musculoskeletal: Normal range of motion.  Full ROM to upper and lower extremities  without difficulty noted, negative ataxia noted.  Lymphadenopathy:    She has no cervical adenopathy.  Neurological: She is alert and oriented to person, place, and time. No cranial nerve deficit. She exhibits normal muscle tone. Coordination normal.  Skin: Skin is warm and dry. No rash noted. She is not diaphoretic. No erythema.  Psychiatric: She has a normal mood and affect. Her behavior is normal. Thought content normal.  Nursing note and vitals reviewed.   ED Course  Procedures (including critical care time)  Results for orders placed or performed during the hospital encounter of 04/06/14  Wet prep, genital  Result Value Ref Range   Yeast Wet Prep HPF POC NONE SEEN NONE SEEN   Trich, Wet Prep NONE SEEN NONE SEEN   Clue Cells Wet Prep HPF POC MODERATE (A) NONE SEEN   WBC, Wet Prep HPF POC FEW (A) NONE SEEN  CBC with Differential  Result Value Ref Range   WBC 6.2 4.0 - 10.5 K/uL   RBC 5.04 3.87 - 5.11 MIL/uL   Hemoglobin 14.5 12.0 - 15.0 g/dL   HCT 40.6 36.0 - 46.0 %   MCV 80.6 78.0 - 100.0 fL   MCH 28.8 26.0 - 34.0 pg   MCHC 35.7 30.0 - 36.0 g/dL   RDW 12.2 11.5 - 15.5 %   Platelets 332 150 - 400 K/uL   Neutrophils Relative % 49 43 -  77 %   Neutro Abs 3.0 1.7 - 7.7 K/uL   Lymphocytes Relative 41 12 - 46 %   Lymphs Abs 2.5 0.7 - 4.0 K/uL   Monocytes Relative 8 3 - 12 %   Monocytes Absolute 0.5 0.1 - 1.0 K/uL   Eosinophils Relative 2 0 - 5 %   Eosinophils Absolute 0.2 0.0 - 0.7 K/uL   Basophils Relative 0 0 - 1 %   Basophils Absolute 0.0 0.0 - 0.1 K/uL  Comprehensive metabolic panel  Result Value Ref Range   Sodium 135 (L) 137 - 147 mEq/L   Potassium 3.7 3.7 - 5.3 mEq/L   Chloride 97 96 - 112 mEq/L   CO2 23 19 - 32 mEq/L   Glucose, Bld 304 (H) 70 - 99 mg/dL   BUN 11 6 - 23 mg/dL   Creatinine, Ser 0.53 0.50 - 1.10 mg/dL   Calcium 10.0 8.4 - 10.5 mg/dL   Total Protein 8.0 6.0 - 8.3 g/dL   Albumin 3.6 3.5 - 5.2 g/dL   AST 15 0 - 37 U/L   ALT 10 0 - 35 U/L    Alkaline Phosphatase 73 39 - 117 U/L   Total Bilirubin <0.2 (L) 0.3 - 1.2 mg/dL   GFR calc non Af Amer >90 >90 mL/min   GFR calc Af Amer >90 >90 mL/min   Anion gap 15 5 - 15  Lipase, blood  Result Value Ref Range   Lipase 12 11 - 59 U/L  Urinalysis, Routine w reflex microscopic  Result Value Ref Range   Color, Urine YELLOW YELLOW   APPearance TURBID (A) CLEAR   Specific Gravity, Urine 1.027 1.005 - 1.030   pH 5.5 5.0 - 8.0   Glucose, UA >1000 (A) NEGATIVE mg/dL   Hgb urine dipstick TRACE (A) NEGATIVE   Bilirubin Urine NEGATIVE NEGATIVE   Ketones, ur NEGATIVE NEGATIVE mg/dL   Protein, ur 30 (A) NEGATIVE mg/dL   Urobilinogen, UA 0.2 0.0 - 1.0 mg/dL   Nitrite NEGATIVE NEGATIVE   Leukocytes, UA SMALL (A) NEGATIVE  Troponin I  Result Value Ref Range   Troponin I <0.30 <0.30 ng/mL  Ketones, qualitative  Result Value Ref Range   Acetone, Bld NEGATIVE NEGATIVE  D-dimer, quantitative  Result Value Ref Range   D-Dimer, Quant <0.27 0.00 - 0.48 ug/mL-FEU  Urine microscopic-add on  Result Value Ref Range   Squamous Epithelial / LPF MANY (A) RARE   WBC, UA 11-20 <3 WBC/hpf   RBC / HPF 0-2 <3 RBC/hpf   Bacteria, UA MANY (A) RARE  Basic metabolic panel  Result Value Ref Range   Sodium 134 (L) 137 - 147 mEq/L   Potassium 3.7 3.7 - 5.3 mEq/L   Chloride 96 96 - 112 mEq/L   CO2 22 19 - 32 mEq/L   Glucose, Bld 265 (H) 70 - 99 mg/dL   BUN 10 6 - 23 mg/dL   Creatinine, Ser 0.50 0.50 - 1.10 mg/dL   Calcium 9.7 8.4 - 10.5 mg/dL   GFR calc non Af Amer >90 >90 mL/min   GFR calc Af Amer >90 >90 mL/min   Anion gap 16 (H) 5 - 15  CBG monitoring, ED  Result Value Ref Range   Glucose-Capillary 297 (H) 70 - 99 mg/dL  POC urine preg, ED (not at [facility])  Result Value Ref Range   Preg Test, Ur NEGATIVE NEGATIVE  CBG monitoring, ED  Result Value Ref Range   Glucose-Capillary 232 (H) 70 - 99 mg/dL  Labs Review Labs Reviewed  WET PREP, GENITAL - Abnormal; Notable for the following:    Clue  Cells Wet Prep HPF POC MODERATE (*)    WBC, Wet Prep HPF POC FEW (*)    All other components within normal limits  COMPREHENSIVE METABOLIC PANEL - Abnormal; Notable for the following:    Sodium 135 (*)    Glucose, Bld 304 (*)    Total Bilirubin <0.2 (*)    All other components within normal limits  URINALYSIS, ROUTINE W REFLEX MICROSCOPIC - Abnormal; Notable for the following:    APPearance TURBID (*)    Glucose, UA >1000 (*)    Hgb urine dipstick TRACE (*)    Protein, ur 30 (*)    Leukocytes, UA SMALL (*)    All other components within normal limits  URINE MICROSCOPIC-ADD ON - Abnormal; Notable for the following:    Squamous Epithelial / LPF MANY (*)    Bacteria, UA MANY (*)    All other components within normal limits  BASIC METABOLIC PANEL - Abnormal; Notable for the following:    Sodium 134 (*)    Glucose, Bld 265 (*)    Anion gap 16 (*)    All other components within normal limits  CBG MONITORING, ED - Abnormal; Notable for the following:    Glucose-Capillary 297 (*)    All other components within normal limits  CBG MONITORING, ED - Abnormal; Notable for the following:    Glucose-Capillary 232 (*)    All other components within normal limits  URINE CULTURE  GC/CHLAMYDIA PROBE AMP  CBC WITH DIFFERENTIAL  LIPASE, BLOOD  TROPONIN I  KETONES, QUALITATIVE  D-DIMER, QUANTITATIVE  RPR  HIV ANTIBODY (ROUTINE TESTING)  POC URINE PREG, ED    Imaging Review Ct Abdomen Pelvis W Contrast  04/06/2014   ADDENDUM REPORT: 04/06/2014 18:27  ADDENDUM: There is a normal caliber appendix in the right lower quadrant without periappendiceal inflammatory changes.   Electronically Signed   By: Kathreen Devoid   On: 04/06/2014 18:27   04/06/2014   CLINICAL DATA:  INCREASING GENERALIZED NECK PAIN AND UPPER ABDOMINAL PAIN FOR 1 WEEK  EXAM: CT ABDOMEN AND PELVIS WITH CONTRAST  TECHNIQUE: Multidetector CT imaging of the abdomen and pelvis was performed using the standard protocol following bolus  administration of intravenous contrast.  CONTRAST:  22mL OMNIPAQUE IOHEXOL 300 MG/ML SOLN, 82mL OMNIPAQUE IOHEXOL 300 MG/ML SOLN  COMPARISON:  None.  FINDINGS: The lung bases are clear.  The liver demonstrates no focal abnormality. There is no intrahepatic or extrahepatic biliary ductal dilatation. The gallbladder is normal. The spleen demonstrates no focal abnormality. The kidneys, adrenal glands and pancreas are normal. The bladder is unremarkable.  The stomach, duodenum, small intestine, and large intestine demonstrate no contrast extravasation or dilatation. There is no pneumoperitoneum, pneumatosis, or portal venous gas. There is a small amount of pelvic free fluid. There is no lymphadenopathy.  There is high-density fluid present within the endometrial cavity to the level of the cervix.  The abdominal aorta is normal in caliber .  There are no lytic or sclerotic osseous lesions.  IMPRESSION: 1. High density fluid within the endometrial cavity extending to the level of the cervix. This may be secondary to cervical stenosis or possible obstructing mass versus scarring. Recommend correlation with physical examination.  Electronically Signed: By: Kathreen Devoid On: 04/06/2014 15:57     EKG Interpretation   Date/Time:  Tuesday April 06 2014 13:58:37 EST Ventricular Rate:  91 PR  Interval:  121 QRS Duration: 70 QT Interval:  353 QTC Calculation: 434 R Axis:   68 Text Interpretation:  Sinus rhythm Left atrial enlargement Borderline T  abnormalities, anterior leads No significant change since last tracing  Confirmed by Canary Brim  MD, MARTHA [phone]) on 04/06/2014 2:52:39 PM       6:03 PM This provider spoke with Dr. Elly Modena, on call physician for OBGYN. Discussed case, labs, imaging, physical exam in great detail with physician. As per physician agreed to antibiotics. Recommended transfer to women's unit.   MDM   Final diagnoses:  PID (acute pelvic inflammatory disease)  Tachycardia  Diabetes  type 1, uncontrolled    Medications  sodium chloride 0.9 % bolus 500 mL (0 mLs Intravenous Stopped 04/06/14 1357)  iohexol (OMNIPAQUE) 300 MG/ML solution 50 mL (50 mLs Oral Contrast Given 04/06/14 1540)  iohexol (OMNIPAQUE) 300 MG/ML solution 80 mL (80 mLs Intravenous Contrast Given 04/06/14 1540)  sodium chloride 0.9 % bolus 500 mL (0 mLs Intravenous Stopped 04/06/14 1758)  clindamycin (CLEOCIN) IVPB 600 mg (600 mg Intravenous New Bag/Given 04/06/14 1758)    Filed Vitals:   04/06/14 1530 04/06/14 1632 04/06/14 1730 04/06/14 1812  BP:  120/78 131/92 135/92  Pulse: 92 116  102  Temp:      TempSrc:      Resp: 25 18 24 23   SpO2: 99% 100%  99%   This provider reviewed the patient's chart. Patient has been seen and assessed in ED setting numerous times. Patient has history of DKA and poor diabetic maintenance. EKG noted normal sinus rhythm with heart rate of 91 bpm with left atrial enlargement, borderline T abnormalities-no significant change since last tracing. Troponin negative elevation. D-dimer negative elevation. CBC negative elevated white blood cell count-equal to 14.5, hematocrit 40.6. CMP unremarkable. Glucose 304, bicarbonate 23, negative elevated anion gap-15.0 mg/L. Lipase negative elevation. Ketones negative elevation. Urine pregnancy negative. Urinalysis noted trace hemoglobin with negative nitrites and small amount of leukocytes with white blood cell count 11-20, many bacteria many squamous cells identified-urine culture pending. CT abdomen pelvis with contrast noted high density fluid within the endometrial cavity extending to the level of cervix - secondary to cervical stenosis or possible obstructing mass versus scarring. Doubt DKA. Doubt PE. Doubt pancreatitis. Doubt appendicitis. Urinalysis noted moderate white blood cells measuring 11-20 with many swallows cells-urine culture pending. CT noted high density fluid within the endometrial cavity, on exam positive CMT - suspicion high for  PID. Patient does have history of gonorrhea/chlamydia. Patient continues to be tachycardic while in the ED setting even after fluids given. Patient started on IV antibiotics. Discussed case with OBGYN - patient to be transferred for PID. Discussed plan with patient. Patient agreed to plan of care. Patient stable for transfer.   Jamse Mead, PA-C 04/06/14 [place], PA-C 04/06/14 [place], MD 04/08/14 [phone]

## 2014-04-06 NOTE — ED Notes (Signed)
Patient is unable to urinate at this time 

## 2014-04-06 NOTE — ED Notes (Signed)
CBG was notified to nurse.

## 2014-04-06 NOTE — ED Notes (Addendum)
Per EMS, Pt, from home, c/o increasing generalized back pain and upper abdominal pain x 1 week.  Pain score 8/10.  Denies n/v/d.  Pt is supposed to start menstrual cycle this week.  Hx of DM.  Pt has not taken anything for pain.

## 2014-04-06 NOTE — Progress Notes (Signed)
Pt seen by [facility] staff Staff  Following appointment information entered in EPIC for pt at d/c  Follow-up With Details Why Contact Info Cresenciano Genre, MD On 04/21/2014 Your appointment is Wednesday April 21 2014 at 2:30 pm with Dr Aundra Dubin at [facility] internal medicine [place] [id number] [phone]

## 2014-04-06 NOTE — ED Notes (Signed)
Bed: WA21 Expected date:  Expected time:  Means of arrival:  Comments: TR 1

## 2014-04-07 ENCOUNTER — Encounter (HOSPITAL_COMMUNITY): Payer: Self-pay | Admitting: *Deleted

## 2014-04-07 LAB — GC/CHLAMYDIA PROBE AMP
CT PROBE, AMP APTIMA: NEGATIVE
GC Probe RNA: NEGATIVE

## 2014-04-07 MED ORDER — INSULIN GLARGINE 100 UNIT/ML ~~LOC~~ SOLN
20.0000 [IU] | Freq: Two times a day (BID) | SUBCUTANEOUS | Status: DC
  Administered 2014-04-07 – 2014-04-08 (×3): 20 [IU] via SUBCUTANEOUS
  Filled 2014-04-07 (×5): qty 0.2

## 2014-04-07 MED ORDER — INFLUENZA VAC SPLIT QUAD 0.5 ML IM SUSY: 0.5000 mL | PREFILLED_SYRINGE | INTRAMUSCULAR | Status: DC

## 2014-04-07 MED ORDER — METRONIDAZOLE 500 MG PO TABS
500.0000 mg | ORAL_TABLET | Freq: Two times a day (BID) | ORAL | Status: DC
  Administered 2014-04-07 – 2014-04-08 (×3): 500 mg via ORAL
  Filled 2014-04-07 (×5): qty 1

## 2014-04-07 NOTE — Progress Notes (Signed)
Inpatient Diabetes Program Recommendations  AACE/ADA: New Consensus Statement on Inpatient Glycemic Control (2013)  Target Ranges:  Prepandial:   less than 140 mg/dL      Peak postprandial:   less than 180 mg/dL (1-2 hours)      Critically ill patients:  140 - 180 mg/dL   Results for SKYELER, ACOFF (MRN [id number]) as of 04/07/2014 08:09  Ref. Range 04/06/2014 11:48 04/06/2014 16:48 04/06/2014 20:24 04/06/2014 23:39  Glucose-Capillary Latest Range: 70-99 mg/dL 297 (H) 232 (H) 374 (H) 338 (H)  Results for MELDA, SIEBELS (MRN [id number]) as of 04/07/2014 08:09  Ref. Range 08/07/2013 19:59  Hgb A1c MFr Bld Latest Range: <5.7 % 19.3 (H)   Diabetes history: DM1 Outpatient Diabetes medications: Lantus 20 units BID, Novolog 2-10 units BID Current orders for Inpatient glycemic control: Lantus 20 units BID, Novolog 1-10 units TID with meals  Inpatient Diabetes Program Recommendations Insulin - Meal Coverage: Patient has Type 1 diabetes and will require carbohydrate coverage. Please consider ordering Novolog 4 units TID with meals (in addition to Novolog correction scale). HgbA1C: Last A1C in the chart was 19.3% on 08/07/13. Please consider ordering an A1C to evaluate glycemic control over the past 2-3 months.  Thanks, Barnie Alderman, RN, MSN, CCRN, CDE Diabetes Coordinator Inpatient Diabetes Program [phone] (Team Pager) [phone] ([facility] office) [phone] [facility] office)

## 2014-04-07 NOTE — Progress Notes (Addendum)
Pt received 9 units of Novolog @ 2129 and 20 units Lantus @ 2212 as ordered. Pt ate meal. Pt 2 hour post prandial CBG 338. Dr. Elly Modena notified. One time Novolog order 10units, no follow up CBG unless symptomatic. Discussed with patient signs of hypo/hyperglycemia.  Will continue to monitor.

## 2014-04-07 NOTE — Progress Notes (Signed)
Patient ID: Ashley Freeman, female   DOB: 03-04-1991, 23 y.o.   MRN: [id number] Patient reports improvement in her abdominal pain this morning. She tolerated a regular diet without nausea or emesis.   GENERAL: Well-developed, well-nourished female in no acute distress.  ABDOMEN: Soft, nontender, nondistended.  EXTREMITIES: No cyanosis, clubbing, or edema, 2+ distal pulses.  A/P 23 yo admitted for parenteral treatment of PID - Patient afebrile and hemodynamically stable since admission - Will continue IV antibiotics this morning - Consider discharge this evening or in the am

## 2014-04-07 NOTE — Plan of Care (Signed)
Problem: Phase II Progression Outcomes Goal: Vital signs remain stable Outcome: Completed/Met Date Met:  04/07/14 Goal: Obtain order to discontinue catheter if appropriate Outcome: Not Applicable Date Met:  01/65/80  Problem: Phase III Progression Outcomes Goal: Pain controlled on oral analgesia Outcome: Completed/Met Date Met:  04/07/14 Goal: Voiding independently Outcome: Completed/Met Date Met:  04/07/14 Goal: Foley discontinued Outcome: Not Applicable Date Met:  06/34/94  Problem: Discharge Progression Outcomes Goal: Pain controlled with appropriate interventions Outcome: Completed/Met Date Met:  04/07/14

## 2014-04-07 NOTE — Plan of Care (Signed)
Problem: Consults Goal: General Medical Patient Education See Patient Education Module for specific education.  Outcome: Completed/Met Date Met:  04/07/14 Goal: Diabetes Guidelines if Diabetic/Glucose > 140 If diabetic or lab glucose is > 140 mg/dl - Initiate Diabetes/Hyperglycemia Guidelines & Document Interventions  Outcome: Completed/Met Date Met:  04/07/14  Problem: Phase I Progression Outcomes Goal: Pain controlled with appropriate interventions Outcome: Completed/Met Date Met:  04/07/14 Goal: OOB as tolerated unless otherwise ordered Outcome: Completed/Met Date Met:  04/07/14 Goal: Initial discharge plan identified Outcome: Completed/Met Date Met:  04/07/14 Goal: Voiding-avoid urinary catheter unless indicated Outcome: Completed/Met Date Met:  04/07/14 Goal: Hemodynamically stable Outcome: Completed/Met Date Met:  04/07/14 Goal: Other Phase I Outcomes/Goals Outcome: Completed/Met Date Met:  04/07/14  Problem: Phase II Progression Outcomes Goal: Progress activity as tolerated unless otherwise ordered Outcome: Completed/Met Date Met:  04/07/14

## 2014-04-08 DIAGNOSIS — N390 Urinary tract infection, site not specified: Secondary | ICD-10-CM | POA: Clinically undetermined

## 2014-04-08 DIAGNOSIS — O2341 Unspecified infection of urinary tract in pregnancy, first trimester: Secondary | ICD-10-CM | POA: Clinically undetermined

## 2014-04-08 DIAGNOSIS — N730 Acute parametritis and pelvic cellulitis: Principal | ICD-10-CM

## 2014-04-08 DIAGNOSIS — B951 Streptococcus, group B, as the cause of diseases classified elsewhere: Secondary | ICD-10-CM | POA: Clinically undetermined

## 2014-04-08 LAB — GLUCOSE, CAPILLARY
Glucose-Capillary: 287 mg/dL — ABNORMAL HIGH (ref 70–99)
Glucose-Capillary: 297 mg/dL — ABNORMAL HIGH (ref 70–99)
Glucose-Capillary: 388 mg/dL — ABNORMAL HIGH (ref 70–99)

## 2014-04-08 MED ORDER — OXYCODONE-ACETAMINOPHEN 5-325 MG PO TABS: 1.0000 | ORAL_TABLET | ORAL | Status: DC | PRN

## 2014-04-08 MED ORDER — METRONIDAZOLE 500 MG PO TABS: 500.0000 mg | ORAL_TABLET | Freq: Two times a day (BID) | ORAL | Status: DC

## 2014-04-08 MED ORDER — IBUPROFEN 600 MG PO TABS: 600.0000 mg | ORAL_TABLET | Freq: Four times a day (QID) | ORAL | Status: DC | PRN

## 2014-04-08 MED ORDER — CIPROFLOXACIN HCL 500 MG PO TABS: 500.0000 mg | ORAL_TABLET | Freq: Two times a day (BID) | ORAL | Status: DC

## 2014-04-08 NOTE — Discharge Instructions (Signed)
Pelvic Inflammatory Disease Pelvic inflammatory disease (PID) refers to an infection in some or all of the female organs. The infection can be in the uterus, ovaries, fallopian tubes, or the surrounding tissues in the pelvis. PID can cause abdominal or pelvic pain that comes on suddenly (acute pelvic pain). PID is a serious infection because it can lead to lasting (chronic) pelvic pain or the inability to have children (infertile).  CAUSES  The infection is often caused by the normal bacteria found in the vaginal tissues. PID may also be caused by an infection that is spread during sexual contact. PID can also occur following:   The birth of a baby.   A miscarriage.   An abortion.   Major pelvic surgery.   The use of an intrauterine device (IUD).   A sexual assault.  RISK FACTORS Certain factors can put a person at higher risk for PID, such as:  Being younger than 25 years.  Being sexually active at [place] age.  Usingnonbarrier contraception.  Havingmultiple sexual partners.  Having sex with someone who has symptoms of a genital infection.  Using oral contraception. Other times, certain behaviors can increase the possibility of getting PID, such as:  Having sex during your period.  Using a vaginal douche.  Having an intrauterine device (IUD) in place. SYMPTOMS   Abdominal or pelvic pain.   Fever.   Chills.   Abnormal vaginal discharge.  Abnormal uterine bleeding.   Unusual pain shortly after finishing your period. DIAGNOSIS  Your caregiver will choose some of the following methods to make a diagnosis, such as:   Performinga physical exam and history. A pelvic exam typically reveals a very tender uterus and surrounding pelvis.   Ordering laboratory tests including a pregnancy test, blood tests, and urine test.  Orderingcultures of the vagina and cervix to check for a sexually transmitted infection (STI).  Performing an ultrasound.    Performing a laparoscopic procedure to look inside the pelvis.  TREATMENT   Antibiotic medicines may be prescribed and taken by mouth.   Sexual partners may be treated when the infection is caused by a sexually transmitted disease (STD).   Hospitalization may be needed to give antibiotics intravenously.  Surgery may be needed, but this is rare. It may take weeks until you are completely well. If you are diagnosed with PID, you should also be checked for human immunodeficiency virus (HIV). HOME CARE INSTRUCTIONS   If given, take your antibiotics as directed. Finish the medicine even if you start to feel better.   Only take over-the-counter or prescription medicines for pain, discomfort, or fever as directed by your caregiver.   Do not have sexual intercourse until treatment is completed or as directed by your caregiver. If PID is confirmed, your recent sexual partner(s) will need treatment.   Keep your follow-up appointments. SEEK MEDICAL CARE IF:   You have increased or abnormal vaginal discharge.   You need prescription medicine for your pain.   You vomit.   You cannot take your medicines.   Your partner has an STD.  SEEK IMMEDIATE MEDICAL CARE IF:   You have a fever.   You have increased abdominal or pelvic pain.   You have chills.   You have pain when you urinate.   You are not better after 72 hours following treatment.  MAKE SURE YOU:   Understand these instructions.  Will watch your condition.  Will get help right away if you are not doing well or get worse.  Document Released: 04/16/2005 Document Revised: 08/11/2012 Document Reviewed: 04/12/2011 [facility] Patient Information 2015 [place], [place]. This information is not intended to replace advice given to you by your health care provider. Make sure you discuss any questions you have with your health care provider.

## 2014-04-08 NOTE — Discharge Summary (Signed)
Physician Discharge Summary  Patient ID: Ashley Freeman MRN: [id number] DOB/AGE: 21-30-1992 21 y.o.  Admit date: 04/06/2014 Discharge date: 04/08/2014  Admission Diagnoses:  Discharge Diagnoses:  Principal Problem:   PID (acute pelvic inflammatory disease) Active Problems:   Diabetes mellitus type 1 (uncontrolled)   Infection of urinary tract  Discharged Condition: Stable  Hospital Course: Admitted for lower abdominal pain and nausea and subjective fevers; thought to have PID.  Admitted for IV antibiotics and pain improved.  On day of discharge, patient was noted to have a positive urine culture with >100 K E.coli, sensitivities pending.  Sent home on 2 week course of Ciprofloxacin and Metronidazole to cover presumptive PID and complicated UTI.  Of note, patient;s DM was still not very controlled during admission and she will follow up with her internist for further management.   Consults: None  Significant Diagnostic Studies: Results for orders placed or performed during the hospital encounter of 04/06/14 (from the past 72 hour(s))  CBG monitoring, ED     Status: Abnormal   Collection Time: 04/06/14 11:48 AM  Result Value Ref Range   Glucose-Capillary 297 (H) 70 - 99 mg/dL  CBC with Differential     Status: None   Collection Time: 04/06/14  1:09 PM  Result Value Ref Range   WBC 6.2 4.0 - 10.5 K/uL   RBC 5.04 3.87 - 5.11 MIL/uL   Hemoglobin 14.5 12.0 - 15.0 g/dL   HCT 40.6 36.0 - 46.0 %   MCV 80.6 78.0 - 100.0 fL   MCH 28.8 26.0 - 34.0 pg   MCHC 35.7 30.0 - 36.0 g/dL   RDW 12.2 11.5 - 15.5 %   Platelets 332 150 - 400 K/uL   Neutrophils Relative % 49 43 - 77 %   Neutro Abs 3.0 1.7 - 7.7 K/uL   Lymphocytes Relative 41 12 - 46 %   Lymphs Abs 2.5 0.7 - 4.0 K/uL   Monocytes Relative 8 3 - 12 %   Monocytes Absolute 0.5 0.1 - 1.0 K/uL   Eosinophils Relative 2 0 - 5 %   Eosinophils Absolute 0.2 0.0 - 0.7 K/uL   Basophils Relative 0 0 - 1 %   Basophils Absolute 0.0 0.0 -  0.1 K/uL  Comprehensive metabolic panel     Status: Abnormal   Collection Time: 04/06/14  1:09 PM  Result Value Ref Range   Sodium 135 (L) 137 - 147 mEq/L   Potassium 3.7 3.7 - 5.3 mEq/L   Chloride 97 96 - 112 mEq/L   CO2 23 19 - 32 mEq/L   Glucose, Bld 304 (H) 70 - 99 mg/dL   BUN 11 6 - 23 mg/dL   Creatinine, Ser 0.53 0.50 - 1.10 mg/dL   Calcium 10.0 8.4 - 10.5 mg/dL   Total Protein 8.0 6.0 - 8.3 g/dL   Albumin 3.6 3.5 - 5.2 g/dL   AST 15 0 - 37 U/L   ALT 10 0 - 35 U/L   Alkaline Phosphatase 73 39 - 117 U/L   Total Bilirubin <0.2 (L) 0.3 - 1.2 mg/dL   GFR calc non Af Amer >90 >90 mL/min   GFR calc Af Amer >90 >90 mL/min    Comment: (NOTE) The eGFR has been calculated using the CKD EPI equation. This calculation has not been validated in all clinical situations. eGFR's persistently <90 mL/min signify possible Chronic Kidney Disease.    Anion gap 15 5 - 15  Lipase, blood     Status: None  Collection Time: 04/06/14  1:09 PM  Result Value Ref Range   Lipase 12 11 - 59 U/L  Troponin I     Status: None   Collection Time: 04/06/14  1:09 PM  Result Value Ref Range   Troponin I <0.30 <0.30 ng/mL    Comment:        Due to the release kinetics of cTnI, a negative result within the first hours of the onset of symptoms does not rule out myocardial infarction with certainty. If myocardial infarction is still suspected, repeat the test at appropriate intervals.   Urinalysis, Routine w reflex microscopic     Status: Abnormal   Collection Time: 04/06/14  1:52 PM  Result Value Ref Range   Color, Urine YELLOW YELLOW   APPearance TURBID (A) CLEAR   Specific Gravity, Urine 1.027 1.005 - 1.030   pH 5.5 5.0 - 8.0   Glucose, UA >1000 (A) NEGATIVE mg/dL   Hgb urine dipstick TRACE (A) NEGATIVE   Bilirubin Urine NEGATIVE NEGATIVE   Ketones, ur NEGATIVE NEGATIVE mg/dL   Protein, ur 30 (A) NEGATIVE mg/dL   Urobilinogen, UA 0.2 0.0 - 1.0 mg/dL   Nitrite NEGATIVE NEGATIVE   Leukocytes, UA  SMALL (A) NEGATIVE  Urine microscopic-add on     Status: Abnormal   Collection Time: 04/06/14  1:52 PM  Result Value Ref Range   Squamous Epithelial / LPF MANY (A) RARE   WBC, UA 11-20 <3 WBC/hpf   RBC / HPF 0-2 <3 RBC/hpf   Bacteria, UA MANY (A) RARE  POC urine preg, ED (not at [facility])     Status: None   Collection Time: 04/06/14  1:58 PM  Result Value Ref Range   Preg Test, Ur NEGATIVE NEGATIVE    Comment:        THE SENSITIVITY OF THIS METHODOLOGY IS >24 mIU/mL   Ketones, qualitative     Status: None   Collection Time: 04/06/14  2:40 PM  Result Value Ref Range   Acetone, Bld NEGATIVE NEGATIVE  D-dimer, quantitative     Status: None   Collection Time: 04/06/14  2:40 PM  Result Value Ref Range   D-Dimer, Quant <0.27 0.00 - 0.48 ug/mL-FEU    Comment:        AT THE INHOUSE ESTABLISHED CUTOFF VALUE OF 0.48 ug/mL FEU, THIS ASSAY HAS BEEN DOCUMENTED IN THE LITERATURE TO HAVE A SENSITIVITY AND NEGATIVE PREDICTIVE VALUE OF AT LEAST 98 TO 99%.  THE TEST RESULT SHOULD BE CORRELATED WITH AN ASSESSMENT OF THE CLINICAL PROBABILITY OF DVT / VTE.   Urine culture     Status: None (Preliminary result)   Collection Time: 04/06/14  3:58 PM  Result Value Ref Range   Specimen Description URINE, CLEAN CATCH    Special Requests Normal    Culture  Setup Time      04/06/2014 22:52 Performed at [facility]      >=100,000 COLONIES/ML Performed at [place] Performed at [facility]    Report Status PENDING   RPR     Status: None   Collection Time: 04/06/14  4:47 PM  Result Value Ref Range   RPR NON REAC NON REAC    Comment: Performed at [facility]  HIV antibody     Status: None   Collection Time: 04/06/14  4:47 PM  Result Value Ref Range   HIV 1&2 Ab,  4th Generation NONREACTIVE NONREACTIVE    Comment: (NOTE) A NONREACTIVE HIV Ag/Ab result does not exclude HIV infection since the time frame for  seroconversion is variable. If acute HIV infection is suspected, a HIV-1 RNA Qualitative TMA test is recommended. HIV-1/2 Antibody Diff         Not indicated. HIV-1 RNA, Qual TMA           Not indicated. PLEASE NOTE: This information has been disclosed to you from records whose confidentiality may be protected by state law. If your state requires such protection, then the state law prohibits you from making any further disclosure of the information without the specific written consent of the person to whom it pertains, or as otherwise permitted by law. A general authorization for the release of medical or other information is NOT sufficient for this purpose. The performance of this assay has not been clinically validated in patients less than 51 years old. Performed at [facility] metabolic panel     Status: Abnormal   Collection Time: 04/06/14  4:47 PM  Result Value Ref Range   Sodium 134 (L) 137 - 147 mEq/L   Potassium 3.7 3.7 - 5.3 mEq/L   Chloride 96 96 - 112 mEq/L   CO2 22 19 - 32 mEq/L   Glucose, Bld 265 (H) 70 - 99 mg/dL   BUN 10 6 - 23 mg/dL   Creatinine, Ser 0.50 0.50 - 1.10 mg/dL   Calcium 9.7 8.4 - 10.5 mg/dL   GFR calc non Af Amer >90 >90 mL/min   GFR calc Af Amer >90 >90 mL/min    Comment: (NOTE) The eGFR has been calculated using the CKD EPI equation. This calculation has not been validated in all clinical situations. eGFR's persistently <90 mL/min signify possible Chronic Kidney Disease.    Anion gap 16 (H) 5 - 15  CBG monitoring, ED     Status: Abnormal   Collection Time: 04/06/14  4:48 PM  Result Value Ref Range   Glucose-Capillary 232 (H) 70 - 99 mg/dL  Wet prep, genital     Status: Abnormal   Collection Time: 04/06/14  5:09 PM  Result Value Ref Range   Yeast Wet Prep HPF POC NONE SEEN NONE SEEN   Trich, Wet Prep NONE SEEN NONE SEEN   Clue Cells Wet Prep HPF POC MODERATE (A) NONE SEEN   WBC, Wet Prep HPF POC FEW (A) NONE SEEN   GC/Chlamydia Probe Amp     Status: None   Collection Time: 04/06/14  5:09 PM  Result Value Ref Range   CT Probe RNA NEGATIVE NEGATIVE   GC Probe RNA NEGATIVE NEGATIVE    Comment: (NOTE)                                                                                       **Normal Reference Range: Negative**      Assay performed using the Gen-Probe APTIMA COMBO2 (R) Assay. Acceptable specimen types for this assay include APTIMA Swabs (Unisex, endocervical, urethral, or vaginal), first void urine, and ThinPrep liquid based cytology samples. Performed at [facility]   Glucose, capillary  Status: Abnormal   Collection Time: 04/06/14  8:24 PM  Result Value Ref Range   Glucose-Capillary 374 (H) 70 - 99 mg/dL   Comment 1 Documented in Chart   Glucose, capillary     Status: Abnormal   Collection Time: 04/06/14 11:39 PM  Result Value Ref Range   Glucose-Capillary 338 (H) 70 - 99 mg/dL  Glucose, capillary     Status: Abnormal   Collection Time: 04/07/14 11:20 AM  Result Value Ref Range   Glucose-Capillary 287 (H) 70 - 99 mg/dL  Glucose, capillary     Status: Abnormal   Collection Time: 04/07/14  2:48 PM  Result Value Ref Range   Glucose-Capillary 297 (H) 70 - 99 mg/dL  Glucose, capillary     Status: Abnormal   Collection Time: 04/07/14  7:59 PM  Result Value Ref Range   Glucose-Capillary 388 (H) 70 - 99 mg/dL   Comment 1 Notify RN     04/06/2014   CT ABDOMEN AND PELVIS WITH CONTRAST CLINICAL DATA:  INCREASING GENERALIZED NECK PAIN AND UPPER ABDOMINAL PAIN FOR 1 WEEK   TECHNIQUE: Multidetector CT imaging of the abdomen and pelvis was performed using the standard protocol following bolus administration of intravenous contrast.  CONTRAST:  22m OMNIPAQUE IOHEXOL 300 MG/ML SOLN, 575mOMNIPAQUE IOHEXOL 300 MG/ML SOLN  COMPARISON:  None.  FINDINGS: The lung bases are clear.  The liver demonstrates no focal abnormality. There is no intrahepatic or extrahepatic biliary ductal  dilatation. The gallbladder is normal. The spleen demonstrates no focal abnormality. The kidneys, adrenal glands and pancreas are normal. The bladder is unremarkable.  The stomach, duodenum, small intestine, and large intestine demonstrate no contrast extravasation or dilatation. There is no pneumoperitoneum, pneumatosis, or portal venous gas. There is a small amount of pelvic free fluid. There is no lymphadenopathy.  There is high-density fluid present within the endometrial cavity to the level of the cervix.  The abdominal aorta is normal in caliber .  There are no lytic or sclerotic osseous lesions.  IMPRESSION: 1. High density fluid within the endometrial cavity extending to the level of the cervix. This may be secondary to cervical stenosis or possible obstructing mass versus scarring. Recommend correlation with physical examination.  Electronically Signed: By: HeKathreen Devoidn: 04/06/2014 15:57   04/06/2014   ADDENDUM REPORT: 04/06/2014 18:27  ADDENDUM: There is a normal caliber appendix in the right lower quadrant without periappendiceal inflammatory changes.   Electronically Signed   By: HeKathreen Devoid On: 04/06/2014 18:27   Treatments: Antibiotics: IV gentamycin and metronidazole transitioned to oral ciprofloxacin and metronidazole at discharge  Discharge Exam: Blood pressure 110/68, pulse 97, temperature 98 F (36.7 C), temperature source Oral, resp. rate 18, height '5\' 3"'  (1.6 m), weight 110 lb (49.896 kg), last menstrual period 03/19/2014, SpO2 99 %. General appearance: alert and no distress GI: soft, mild lower abdomen tenderness; bowel sounds normal; no masses, no organomegaly Pelvic: deferred Extremities: extremities normal, atraumatic, no cyanosis or edema and Homans sign is negative, no sign of DVT  Disposition: 01-Home or Self Care     Medication List    STOP taking these medications        HYDROcodone-acetaminophen 5-325 MG per tablet  Commonly known as:  NORCO      TAKE  these medications        ciprofloxacin 500 MG tablet  Commonly known as:  CIPRO  Take 1 tablet (500 mg total) by mouth 2 (two) times daily.  ibuprofen 600 MG tablet  Commonly known as:  ADVIL,MOTRIN  Take 1 tablet (600 mg total) by mouth every 6 (six) hours as needed.     insulin aspart 100 UNIT/ML injection  Commonly known as:  novoLOG  Inject 2-10 Units into the skin 2 (two) times daily as needed for high blood sugar (CBG over 100). Per sliding scale     LANTUS SOLOSTAR 100 UNIT/ML Solostar Pen  Generic drug:  Insulin Glargine  Inject 20 Units into the skin 2 (two) times daily. Breakfast and dinner     metroNIDAZOLE 500 MG tablet  Commonly known as:  FLAGYL  Take 1 tablet (500 mg total) by mouth 2 (two) times daily.     oxyCODONE-acetaminophen 5-325 MG per tablet  Commonly known as:  PERCOCET/ROXICET  Take 1-2 tablets by mouth every 3 (three) hours as needed (moderate to severe pain (when tolerating fluids)).       Follow-up Information    Follow up with Cresenciano Genre, MD On 04/21/2014.   Specialty:  Internal Medicine   Why:  Your appointment is Wednesday April 21 2014 at 2:30 pm with Dr Aundra Dubin at [facility] internal medicine    Contact information:   [place] [address] [phone]       Follow up with [facility] On 05/10/2014.   Why:  1:15 pm for followup. Call clinic/come to [facility] for any concerning issues   Contact information:   [place] [phone]      Signed: Osborne Oman, MD 04/08/2014, 7:52 AM

## 2014-04-08 NOTE — Progress Notes (Signed)
Pt. Is discharged in the care of self. Downstairs per ambulatory to travel home by bus pass,with R.N. Judd Lien. Stable. Denies any pain or discomfort. Discharge instructions with Rx were given to pt. States she understands all instructions well.

## 2014-04-08 NOTE — Progress Notes (Signed)
Inpatient Diabetes Program Recommendations  AACE/ADA: New Consensus Statement on Inpatient Glycemic Control (2013)  Target Ranges:  Prepandial:   less than 140 mg/dL      Peak postprandial:   less than 180 mg/dL (1-2 hours)      Critically ill patients:  140 - 180 mg/dL   Results for ARLEDA, LAHIFF (MRN [id number]) as of 04/08/2014 07:37  Ref. Range 04/06/2014 20:24 04/06/2014 23:39 04/07/2014 11:20 04/07/2014 14:48 04/07/2014 19:59  Glucose-Capillary Latest Range: 70-99 mg/dL 374 (H) 338 (H) 287 (H) 297 (H) 388 (H)   Diabetes history: DM1 Outpatient Diabetes medications: Lantus 20 units BID, Novolog 2-10 units BID Current orders for Inpatient glycemic control: Lantus 20 units BID, Novolog 1-10 units TID with meals   Inpatient Diabetes Program Recommendations Insulin - Meal Coverage: Patient has Type 1 diabetes and will require carbohydrate coverage. Please consider ordering Novolog 5 units TID with meals (in addition to Novolog correction scale). HgbA1C: Last A1C in the chart was 19.3% on 08/07/13. Please consider ordering an A1C to evaluate glycemic control over the past 2-3 months. Insulin-Basal: Please consider increasing Lantus to 22 units BID. Insulin-Correction: Please consider increasing Novolog correction to moderate scale ACHS.  Thanks, Barnie Alderman, RN, MSN, CCRN, CDE Diabetes Coordinator Inpatient Diabetes Program [phone] (Team Pager) [phone] ([place] office) [phone] [facility] office)

## 2014-04-09 LAB — URINE CULTURE
Colony Count: >=100000
Special Requests: NORMAL

## 2014-04-21 ENCOUNTER — Ambulatory Visit: Payer: Self-pay | Admitting: Internal Medicine

## 2014-05-10 ENCOUNTER — Encounter: Payer: Self-pay | Admitting: Nurse Practitioner

## 2014-05-31 IMAGING — CR DG CHEST 2V
2 series · 2 of 2 positions shown · non-contrast
Comparison: 08/07/2013

CLINICAL DATA: Hyperglycemia diabetes shortness of breath.

EXAM:
CHEST  2 VIEW

[w chest lat]
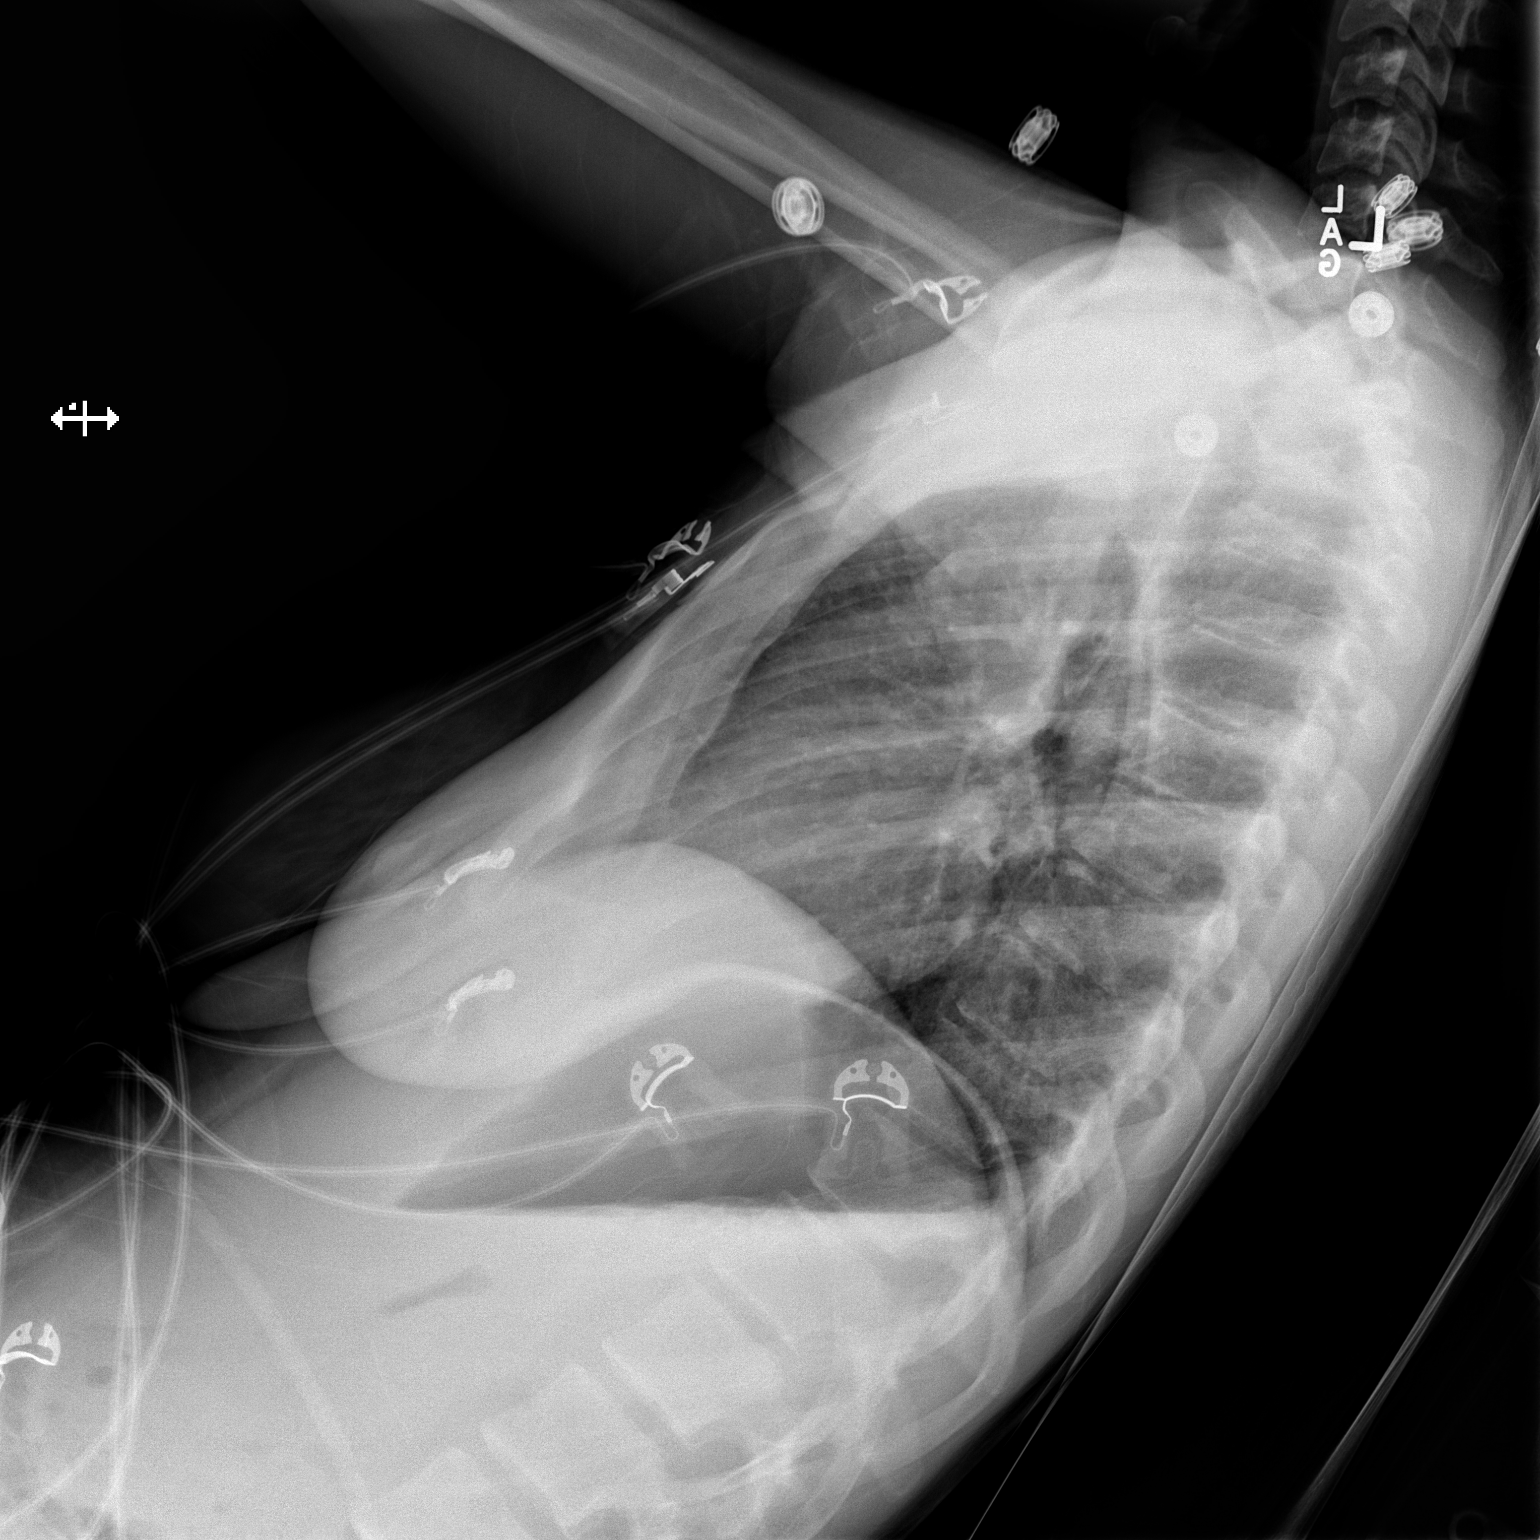

[x chest ap]
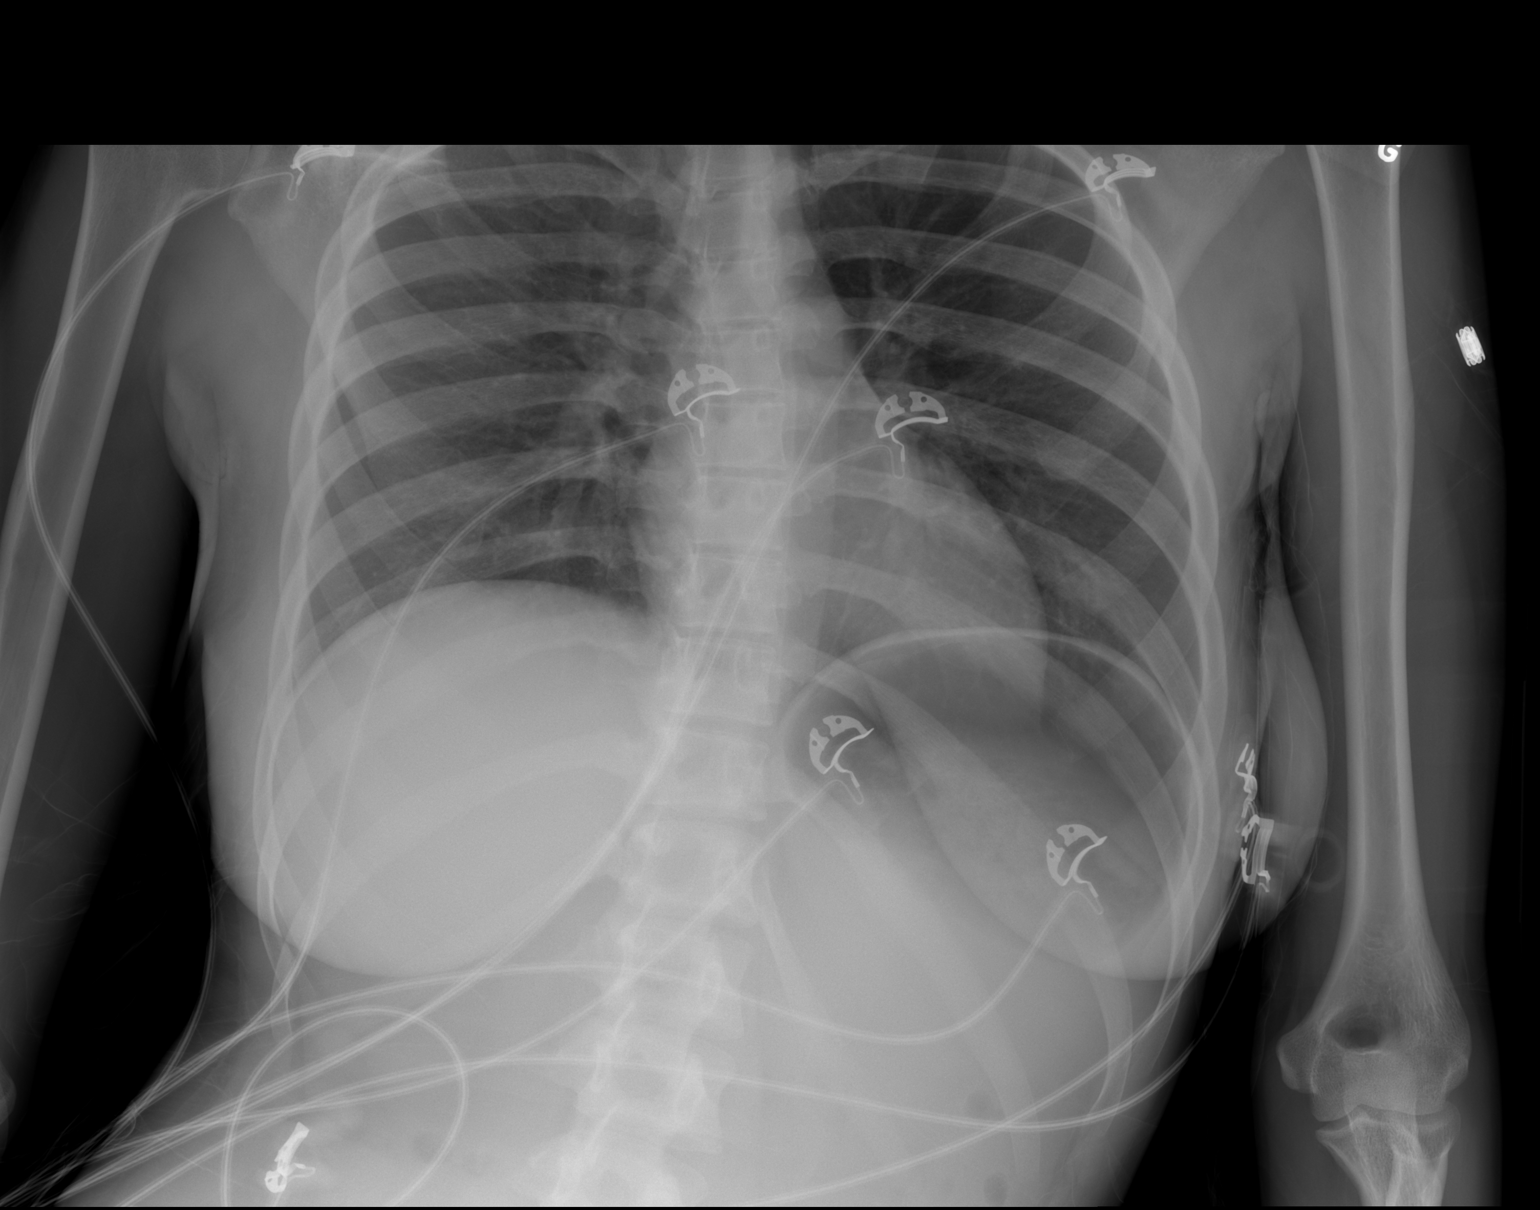

[2 of 2 positions shown; findings below may reference images not displayed]

FINDINGS: The heart size and mediastinal contours are within normal limits.
Both lungs are clear. The visualized skeletal structures are
unremarkable.
IMPRESSION: No active cardiopulmonary disease.

## 2014-07-08 ENCOUNTER — Encounter: Payer: Self-pay | Admitting: *Deleted

## 2014-07-26 ENCOUNTER — Observation Stay (HOSPITAL_COMMUNITY)
Admission: EM | Admit: 2014-07-26 | Discharge: 2014-07-27 | Disposition: A | Discharge status: Home / Self Care | Payer: Self-pay | Attending: Internal Medicine | Admitting: Internal Medicine

## 2014-07-26 ENCOUNTER — Encounter (HOSPITAL_COMMUNITY): Payer: Self-pay

## 2014-07-26 ENCOUNTER — Other Ambulatory Visit (HOSPITAL_COMMUNITY): Payer: Self-pay

## 2014-07-26 ENCOUNTER — Other Ambulatory Visit: Payer: Self-pay

## 2014-07-26 ENCOUNTER — Emergency Department (HOSPITAL_COMMUNITY): Payer: Self-pay

## 2014-07-26 DIAGNOSIS — N179 Acute kidney failure, unspecified: Secondary | ICD-10-CM | POA: Diagnosis present

## 2014-07-26 DIAGNOSIS — E1065 Type 1 diabetes mellitus with hyperglycemia: Secondary | ICD-10-CM | POA: Insufficient documentation

## 2014-07-26 DIAGNOSIS — Z794 Long term (current) use of insulin: Secondary | ICD-10-CM | POA: Insufficient documentation

## 2014-07-26 DIAGNOSIS — R079 Chest pain, unspecified: Principal | ICD-10-CM | POA: Insufficient documentation

## 2014-07-26 DIAGNOSIS — F1099 Alcohol use, unspecified with unspecified alcohol-induced disorder: Secondary | ICD-10-CM | POA: Insufficient documentation

## 2014-07-26 DIAGNOSIS — E108 Type 1 diabetes mellitus with unspecified complications: Secondary | ICD-10-CM

## 2014-07-26 DIAGNOSIS — R739 Hyperglycemia, unspecified: Secondary | ICD-10-CM | POA: Diagnosis present

## 2014-07-26 DIAGNOSIS — Z880 Allergy status to penicillin: Secondary | ICD-10-CM | POA: Insufficient documentation

## 2014-07-26 HISTORY — DX: Acute kidney failure, unspecified: N17.9

## 2014-07-26 LAB — BASIC METABOLIC PANEL
ANION GAP: 11 (ref 5–15)
BUN: 6 mg/dL (ref 6–23)
CALCIUM: 9.2 mg/dL (ref 8.4–10.5)
CHLORIDE: 94 mmol/L — AB (ref 96–112)
CO2: 22 mmol/L (ref 19–32)
CREATININE: 0.94 mg/dL (ref 0.50–1.10)
GFR calc Af Amer: >90 mL/min (ref >90)
GFR calc non Af Amer: 85 mL/min — ABNORMAL LOW (ref >90)
Glucose, Bld: 708 mg/dL (ref 70–99)
Potassium: 3.9 mmol/L (ref 3.5–5.1)
Sodium: 127 mmol/L — ABNORMAL LOW (ref 135–145)

## 2014-07-26 LAB — COMPREHENSIVE METABOLIC PANEL
ALBUMIN: 3.1 g/dL — AB (ref 3.5–5.2)
ALT: 13 U/L (ref 0–35)
ANION GAP: 7 (ref 5–15)
AST: 22 U/L (ref 0–37)
Alkaline Phosphatase: 52 U/L (ref 39–117)
BILIRUBIN TOTAL: 0.6 mg/dL (ref 0.3–1.2)
BUN: 6 mg/dL (ref 6–23)
CALCIUM: 8 mg/dL — AB (ref 8.4–10.5)
CO2: 22 mmol/L (ref 19–32)
Chloride: 107 mmol/L (ref 96–112)
Creatinine, Ser: 0.65 mg/dL (ref 0.50–1.10)
GFR calc Af Amer: >90 mL/min (ref >90)
GFR calc non Af Amer: >90 mL/min (ref >90)
Glucose, Bld: 279 mg/dL — ABNORMAL HIGH (ref 70–99)
Potassium: 3.4 mmol/L — ABNORMAL LOW (ref 3.5–5.1)
SODIUM: 136 mmol/L (ref 135–145)
Total Protein: 6.1 g/dL (ref 6.0–8.3)

## 2014-07-26 LAB — URINALYSIS, ROUTINE W REFLEX MICROSCOPIC
BILIRUBIN URINE: NEGATIVE
Glucose, UA: >1000 mg/dL — AB
Hgb urine dipstick: NEGATIVE
KETONES UR: 15 mg/dL — AB
Leukocytes, UA: NEGATIVE
Nitrite: NEGATIVE
PROTEIN: NEGATIVE mg/dL
Specific Gravity, Urine: 1.04 — ABNORMAL HIGH (ref 1.005–1.030)
UROBILINOGEN UA: 0.2 mg/dL (ref 0.0–1.0)
pH: 6 (ref 5.0–8.0)

## 2014-07-26 LAB — CBG MONITORING, ED
GLUCOSE-CAPILLARY: 317 mg/dL — AB (ref 70–99)
GLUCOSE-CAPILLARY: 203 mg/dL — AB (ref 70–99)
GLUCOSE-CAPILLARY: 196 mg/dL — AB (ref 70–99)

## 2014-07-26 LAB — CBC
HCT: 38.6 % (ref 36.0–46.0)
Hemoglobin: 13.4 g/dL (ref 12.0–15.0)
MCH: 27.7 pg (ref 26.0–34.0)
MCHC: 34.7 g/dL (ref 30.0–36.0)
MCV: 79.9 fL (ref 78.0–100.0)
PLATELETS: 296 10*3/uL (ref 150–400)
RBC: 4.83 MIL/uL (ref 3.87–5.11)
RDW: 12.4 % (ref 11.5–15.5)
WBC: 5.7 10*3/uL (ref 4.0–10.5)

## 2014-07-26 LAB — POC URINE PREG, ED: PREG TEST UR: NEGATIVE

## 2014-07-26 LAB — URINE MICROSCOPIC-ADD ON

## 2014-07-26 LAB — I-STAT TROPONIN, ED: TROPONIN I, POC: 0 ng/mL (ref 0.00–0.08)

## 2014-07-26 LAB — GLUCOSE, CAPILLARY: Glucose-Capillary: 305 mg/dL — ABNORMAL HIGH (ref 70–99)

## 2014-07-26 MED ORDER — SODIUM CHLORIDE 0.9 % IV SOLN
1000.0000 mL | INTRAVENOUS | Status: DC
  Administered 2014-07-26 – 2014-07-27 (×4): 1000 mL via INTRAVENOUS

## 2014-07-26 MED ORDER — DEXTROSE-NACL 5-0.45 % IV SOLN: INTRAVENOUS | Status: DC

## 2014-07-26 MED ORDER — INSULIN GLARGINE 100 UNIT/ML ~~LOC~~ SOLN
15.0000 [IU] | Freq: Two times a day (BID) | SUBCUTANEOUS | Status: DC
  Administered 2014-07-26 – 2014-07-27 (×2): 15 [IU] via SUBCUTANEOUS
  Filled 2014-07-26 (×5): qty 0.15

## 2014-07-26 MED ORDER — SODIUM CHLORIDE 0.9 % IV SOLN
1000.0000 mL | Freq: Once | INTRAVENOUS | Status: AC
  Administered 2014-07-26: 1000 mL via INTRAVENOUS

## 2014-07-26 MED ORDER — GI COCKTAIL ~~LOC~~
30.0000 mL | Freq: Once | ORAL | Status: AC
  Administered 2014-07-26: 30 mL via ORAL
  Filled 2014-07-26: qty 30

## 2014-07-26 MED ORDER — ONDANSETRON HCL 4 MG PO TABS: 4.0000 mg | ORAL_TABLET | Freq: Four times a day (QID) | ORAL | Status: DC | PRN

## 2014-07-26 MED ORDER — INSULIN ASPART 100 UNIT/ML ~~LOC~~ SOLN
0.0000 [IU] | Freq: Three times a day (TID) | SUBCUTANEOUS | Status: DC
  Administered 2014-07-27: 3 [IU] via SUBCUTANEOUS

## 2014-07-26 MED ORDER — ACETAMINOPHEN 650 MG RE SUPP
650.0000 mg | Freq: Four times a day (QID) | RECTAL | Status: DC | PRN
Start: 2014-07-26 — End: 2014-07-27

## 2014-07-26 MED ORDER — ONDANSETRON HCL 4 MG/2ML IJ SOLN: 4.0000 mg | Freq: Four times a day (QID) | INTRAMUSCULAR | Status: DC | PRN

## 2014-07-26 MED ORDER — INSULIN ASPART 100 UNIT/ML ~~LOC~~ SOLN
0.0000 [IU] | Freq: Every day | SUBCUTANEOUS | Status: DC
  Administered 2014-07-26: 5 [IU] via SUBCUTANEOUS

## 2014-07-26 MED ORDER — PNEUMOCOCCAL VAC POLYVALENT 25 MCG/0.5ML IJ INJ
0.5000 mL | INJECTION | INTRAMUSCULAR | Status: AC
  Administered 2014-07-27: 0.5 mL via INTRAMUSCULAR

## 2014-07-26 MED ORDER — ACETAMINOPHEN 325 MG PO TABS
650.0000 mg | ORAL_TABLET | Freq: Four times a day (QID) | ORAL | Status: DC | PRN
  Administered 2014-07-26: 650 mg via ORAL
  Filled 2014-07-26: qty 2

## 2014-07-26 MED ORDER — HEPARIN SODIUM (PORCINE) 5000 UNIT/ML IJ SOLN
5000.0000 [IU] | Freq: Three times a day (TID) | INTRAMUSCULAR | Status: DC
  Filled 2014-07-26: qty 1

## 2014-07-26 MED ORDER — POTASSIUM CHLORIDE CRYS ER 20 MEQ PO TBCR
40.0000 meq | EXTENDED_RELEASE_TABLET | Freq: Once | ORAL | Status: AC
  Administered 2014-07-26: 40 meq via ORAL
  Filled 2014-07-26: qty 2

## 2014-07-26 MED ORDER — SODIUM CHLORIDE 0.9 % IV SOLN
INTRAVENOUS | Status: DC
  Administered 2014-07-26: 6.5 [IU]/h via INTRAVENOUS
  Filled 2014-07-26: qty 2.5

## 2014-07-26 MED ORDER — INSULIN ASPART 100 UNIT/ML ~~LOC~~ SOLN
3.0000 [IU] | Freq: Three times a day (TID) | SUBCUTANEOUS | Status: DC
  Administered 2014-07-27: 3 [IU] via SUBCUTANEOUS

## 2014-07-26 NOTE — ED Notes (Signed)
Pt here for cp and sob, and abscess to left buttock

## 2014-07-26 NOTE — ED Provider Notes (Signed)
CSN: [id number]     Arrival date & time 07/26/14  1300 History   First MD Initiated Contact with Patient 07/26/14 1428     Chief Complaint  Patient presents with  . Chest Pain  . Abscess     (Consider location/radiation/quality/duration/timing/severity/associated sxs/prior Treatment) HPI Comments: Patient with past medical history of type 1 diabetes which is controlled with insulin. Patient is very poorly controlled. She states that her blood sugars have been running in the 600s for the past several weeks. She has been admitted in the past for DKA. She states that today she has had some chest pain and epigastric pain. She reports associated shortness breath. She reports associated polydipsia, but denies any polyuria. Additionally, she complains of a small bump on her buttock. She is concerned that she may have an abscess, she has had a prior abscess near this area. There are no aggravating or alleviating factors.  The history is provided by the patient. No language interpreter was used.    Past Medical History  Diagnosis Date  . Gonorrhea 08/2011    Treated in 09/2011  . Diabetes mellitus 2001    Diagnosed at age 89 ; Type I  . DKA (diabetic ketoacidoses) 08/19/2013   Past Surgical History  Procedure Laterality Date  . No past surgeries    . Incision and drainage perirectal abscess Right 08/18/2013    Procedure: IRRIGATION AND DEBRIDEMENT GLUTEAL ABSCESS;  Surgeon: Ralene Ok, MD;  Location: [place];  Service: General;  Laterality: Right;  . Incision and drainage perirectal abscess Right 09/19/2013    Procedure: IRRIGATION AND DEBRIDEMENT RIGHT GLUTEAL AND LABIAL ABSCESSES;  Surgeon: Ralene Ok, MD;  Location: [place];  Service: General;  Laterality: Right;  . Incision and drainage perirectal abscess Right 09/24/2013    Procedure: IRRIGATION AND DEBRIDEMENT PERIRECTAL ABSCESS;  Surgeon: Gwenyth Ober, MD;  Location: [place];  Service: General;  Laterality: Right;   Family History   Problem Relation Age of Onset  . Anesthesia problems Neg Hx   . Asthma Mother   . Gout Father   . Diabetes Paternal Grandmother    History  Substance Use Topics  . Smoking status: Never Smoker   . Smokeless tobacco: Never Used  . Alcohol Use: Yes     Comment: occ   OB History    Gravida Para Term Preterm AB TAB SAB Ectopic Multiple Living   1 0   1  1   0     Review of Systems  Constitutional: Negative for fever and chills.  Respiratory: Positive for shortness of breath.   Cardiovascular: Positive for chest pain.  Gastrointestinal: Negative for nausea, vomiting, diarrhea and constipation.  Endocrine: Positive for polydipsia.  Genitourinary: Negative for dysuria.  All other systems reviewed and are negative.     Allergies  Penicillins  Home Medications   Prior to Admission medications   Medication Sig Start Date End Date Taking? Authorizing Provider  insulin aspart (NOVOLOG) 100 UNIT/ML injection Inject 2-10 Units into the skin 2 (two) times daily as needed for high blood sugar (CBG over 100). Per sliding scale   Yes Historical Provider, MD  Insulin Glargine (LANTUS SOLOSTAR) 100 UNIT/ML Solostar Pen Inject 20 Units into the skin 2 (two) times daily. Breakfast and dinner   Yes Historical Provider, MD  ciprofloxacin (CIPRO) 500 MG tablet Take 1 tablet (500 mg total) by mouth 2 (two) times daily. Patient not taking: Reported on 07/26/2014 04/08/14   Osborne Oman, MD  ibuprofen (ADVIL,MOTRIN) 600 MG tablet Take 1 tablet (600 mg total) by mouth every 6 (six) hours as needed. Patient not taking: Reported on 07/26/2014 04/08/14   Osborne Oman, MD  metroNIDAZOLE (FLAGYL) 500 MG tablet Take 1 tablet (500 mg total) by mouth 2 (two) times daily. Patient not taking: Reported on 07/26/2014 04/08/14   Osborne Oman, MD  oxyCODONE-acetaminophen (PERCOCET/ROXICET) 5-325 MG per tablet Take 1-2 tablets by mouth every 3 (three) hours as needed (moderate to severe pain (when  tolerating fluids)). Patient not taking: Reported on 07/26/2014 04/08/14   Osborne Oman, MD   BP 106/70 mmHg  Pulse 120  Temp(Src) 98.3 F (36.8 C) (Oral)  Ht 5\' 4"  (1.626 m)  Wt 110 lb (49.896 kg)  BMI 18.87 kg/m2  SpO2 97%  LMP 07/13/2014 Physical Exam  Constitutional: She is oriented to person, place, and time. She appears well-developed and well-nourished.  HENT:  Head: Normocephalic and atraumatic.  Eyes: Conjunctivae and EOM are normal. Pupils are equal, round, and reactive to light.  Neck: Normal range of motion. Neck supple.  Cardiovascular: Regular rhythm.  Exam reveals no gallop and no friction rub.   No murmur heard. Tachycardia  Pulmonary/Chest: Effort normal and breath sounds normal. No respiratory distress. She has no wheezes. She has no rales. She exhibits no tenderness.  Abdominal: Soft. Bowel sounds are normal. She exhibits no distension and no mass. There is no tenderness. There is no rebound and no guarding.  No focal abdominal tenderness, no RLQ tenderness or pain at McBurney's point, no RUQ tenderness or Murphy's sign, no left-sided abdominal tenderness, no fluid wave, or signs of peritonitis   Genitourinary:  Chaperone present for exam, very small bump on left buttock, no appreciable abscess to drain at this time, no surrounding erythema or cellulitis  Musculoskeletal: Normal range of motion. She exhibits no edema or tenderness.  Neurological: She is alert and oriented to person, place, and time.  Skin: Skin is warm and dry.  Psychiatric: She has a normal mood and affect. Her behavior is normal. Judgment and thought content normal.  Nursing note and vitals reviewed.   ED Course  Procedures (including critical care time) Results for orders placed or performed during the hospital encounter of 07/26/14  CBC  Result Value Ref Range   WBC 5.7 4.0 - 10.5 K/uL   RBC 4.83 3.87 - 5.11 MIL/uL   Hemoglobin 13.4 12.0 - 15.0 g/dL   HCT 38.6 36.0 - 46.0 %   MCV  79.9 78.0 - 100.0 fL   MCH 27.7 26.0 - 34.0 pg   MCHC 34.7 30.0 - 36.0 g/dL   RDW 12.4 11.5 - 15.5 %   Platelets 296 150 - 400 K/uL  Basic metabolic panel  Result Value Ref Range   Sodium 127 (L) 135 - 145 mmol/L   Potassium 3.9 3.5 - 5.1 mmol/L   Chloride 94 (L) 96 - 112 mmol/L   CO2 22 19 - 32 mmol/L   Glucose, Bld 708 (HH) 70 - 99 mg/dL   BUN 6 6 - 23 mg/dL   Creatinine, Ser 0.94 0.50 - 1.10 mg/dL   Calcium 9.2 8.4 - 10.5 mg/dL   GFR calc non Af Amer 85 (L) >90 mL/min   GFR calc Af Amer >90 >90 mL/min   Anion gap 11 5 - 15  I-stat troponin, ED (not at [facility])  Result Value Ref Range   Troponin i, poc 0.00 0.00 - 0.08 ng/mL   Comment 3  POC Urine Pregnancy, ED (pre-menopausal females) - do not order at [facility]  Result Value Ref Range   Preg Test, Ur NEGATIVE NEGATIVE   Dg Chest 2 View  07/26/2014   CLINICAL DATA:  Cough and chest pain for 1 week  EXAM: CHEST  2 VIEW  COMPARISON:  December 26, 2013  FINDINGS: Lungs are clear. Heart size and pulmonary vascularity are normal. No adenopathy. No pneumothorax. No bone lesions.  IMPRESSION: No edema or consolidation.   Electronically Signed   By: Lowella Grip III M.D.   On: 07/26/2014 13:47     Imaging Review Dg Chest 2 View  07/26/2014   CLINICAL DATA:  Cough and chest pain for 1 week  EXAM: CHEST  2 VIEW  COMPARISON:  December 26, 2013  FINDINGS: Lungs are clear. Heart size and pulmonary vascularity are normal. No adenopathy. No pneumothorax. No bone lesions.  IMPRESSION: No edema or consolidation.   Electronically Signed   By: Lowella Grip III M.D.   On: 07/26/2014 13:47     EKG Interpretation None      MDM   Final diagnoses:  Hyperglycemia    Patient with hyperglycemia, and some chest pain and shortness breath. Suspect symptoms are related to hyperglycemia. She states that her blood sugars poorly controlled and that she normally runs in the 600s. There is a small bump on her left buttock, but I do not appreciate  anything that would need to be drained at this time. Patient discussed with Dr. Rogene Houston, who recommends admission for glucose control and possibly EKG, though anion gap is normal now. Urinalysis is still pending.  Patient discussed with internal medicine teaching service residents, who will admit the patient.    Montine Circle, PA-C 07/26/14 [place], MD 07/27/14 [phone]

## 2014-07-26 NOTE — ED Notes (Signed)
Report attempted X1 
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
## 2014-07-26 NOTE — ED Notes (Addendum)
Call received from lab that GLUCOSE was critical high of 708.  Reported to Lilia Pro, RN and sent patient straight back to room.  Acuity changed. Also gave information to [place], [place] - taking patient from triage.
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
## 2014-07-27 DIAGNOSIS — E1065 Type 1 diabetes mellitus with hyperglycemia: Secondary | ICD-10-CM

## 2014-07-27 DIAGNOSIS — R079 Chest pain, unspecified: Secondary | ICD-10-CM

## 2014-07-27 DIAGNOSIS — N179 Acute kidney failure, unspecified: Secondary | ICD-10-CM

## 2014-07-27 LAB — CBC
HCT: 33.1 % — ABNORMAL LOW (ref 36.0–46.0)
Hemoglobin: 11.3 g/dL — ABNORMAL LOW (ref 12.0–15.0)
MCH: 27.4 pg (ref 26.0–34.0)
MCHC: 34.1 g/dL (ref 30.0–36.0)
MCV: 80.1 fL (ref 78.0–100.0)
Platelets: 265 10*3/uL (ref 150–400)
RBC: 4.13 MIL/uL (ref 3.87–5.11)
RDW: 12.7 % (ref 11.5–15.5)
WBC: 6.3 10*3/uL (ref 4.0–10.5)

## 2014-07-27 LAB — BASIC METABOLIC PANEL
Anion gap: 5 (ref 5–15)
BUN: 5 mg/dL — AB (ref 6–23)
CALCIUM: 8.4 mg/dL (ref 8.4–10.5)
CO2: 22 mmol/L (ref 19–32)
Chloride: 108 mmol/L (ref 96–112)
Creatinine, Ser: 0.45 mg/dL — ABNORMAL LOW (ref 0.50–1.10)
GFR calc non Af Amer: >90 mL/min (ref >90)
Glucose, Bld: 179 mg/dL — ABNORMAL HIGH (ref 70–99)
Potassium: 3.6 mmol/L (ref 3.5–5.1)
Sodium: 135 mmol/L (ref 135–145)

## 2014-07-27 LAB — GLUCOSE, CAPILLARY
GLUCOSE-CAPILLARY: 192 mg/dL — AB (ref 70–99)
Glucose-Capillary: 224 mg/dL — ABNORMAL HIGH (ref 70–99)

## 2014-07-27 MED ORDER — OMEPRAZOLE 20 MG PO CPDR: 20.0000 mg | DELAYED_RELEASE_CAPSULE | Freq: Every day | ORAL | Status: DC

## 2014-07-27 MED ORDER — ACETAMINOPHEN 325 MG PO TABS: 325.0000 mg | ORAL_TABLET | Freq: Four times a day (QID) | ORAL | Status: DC | PRN

## 2014-07-27 NOTE — Progress Notes (Signed)
Discharge paperwork given to patient. No questions verbalized,patient is ready for discharge.

## 2014-07-27 NOTE — Progress Notes (Signed)
Inpatient Diabetes Program Recommendations  AACE/ADA: New Consensus Statement on Inpatient Glycemic Control (2013)  Target Ranges:  Prepandial:   less than 140 mg/dL      Peak postprandial:   less than 180 mg/dL (1-2 hours)      Critically ill patients:  140 - 180 mg/dL   Results for ALIEGHA, HOCKENBERRY (MRN [id number]) as of 07/27/2014 11:21  Ref. Range 07/26/2014 17:10 07/26/2014 18:10 07/26/2014 18:51 07/26/2014 21:58 07/27/2014 08:05  Glucose-Capillary Latest Range: 70-99 mg/dL 317 (H) 203 (H) 196 (H) 305 (H) 224 (H)   Diabetes history: DM1 Outpatient Diabetes medications: Lantus 20 units BID, Novolog 2-10 units BID with meals Current orders for Inpatient glycemic control: Lantus 15 units BID, Novolog 0-9 units TID with meals, Novolog 0-5 units HS, Novolog 3 units TID with meals for meal coverage  Inpatient Diabetes Program Recommendations Insulin - Basal: Please consider increasing Lantus to 17 units BID. Insulin - Meal Coverage: Please consider increasing meal coverage to Novolog 5 units TID with meals.  Thanks, Barnie Alderman, RN, MSN, CCRN, CDE Diabetes Coordinator Inpatient Diabetes Program [phone] (Team Pager from [place] to [place]) [phone] ([facility] office) [phone] [facility] office)

## 2014-07-27 NOTE — Progress Notes (Signed)
UR completed 

## 2014-07-27 NOTE — Discharge Instructions (Signed)
Please monitor daily blood glucose levels over the next week and bring results to follow-up appointment on 08/03/2014 at 10:45am. Take Prilosec one time daily for the next 28 days to help prevent return of chest pain. If pain continues despite Prilosec, follow-up with a physician at the end of your prescription.

## 2014-07-27 NOTE — Progress Notes (Signed)
Subjective:  Patient states that she has not had any recurrent episodes of chest pain, nausea, or headache. She states that the GI cocktail from yesterday had helped with some of her symptoms. She is otherwise not reporting any complaints this morning.   Objective: Vital signs in last 24 hours: Filed Vitals:   07/26/14 1715 07/26/14 1730 07/26/14 2100 07/27/14 0453  BP: 124/87 119/78 124/83 118/75  Pulse: 105 93 99 96  Temp:   97.8 F (36.6 C) 97.8 F (36.6 C)  TempSrc:   Oral Oral  Resp:   17 17  Height:   5' 4.02" (1.626 m)   Weight:   106 lb 0.7 oz (48.1 kg)   SpO2: 100% 100% 100% 100%   Weight change:   Intake/Output Summary (Last 24 hours) at 07/27/14 0907 Last data filed at 07/27/14 0610  Gross per 24 hour  Intake   1824 ml  Output      0 ml  Net   1824 ml    General: resting in bed HEENT: PERRL, EOMI, no scleral icterus Cardiac: RRR, no rubs, murmurs or gallops Pulm: clear to auscultation bilaterally, moving normal volumes of air Abd: soft, nontender, nondistended, BS present Ext: warm and well perfused, no pedal edema Neuro: alert and oriented X3, cranial nerves II-XII grossly intact Skin: no rashes or lesions noted Psych: appropriate affect  Lab Results: Basic Metabolic Panel:  Recent Labs Lab 07/26/14 1741 07/27/14 0435  NA 136 135  K 3.4* 3.6  CL 107 108  CO2 22 22  GLUCOSE 279* 179*  BUN 6 5*  CREATININE 0.65 0.45*  CALCIUM 8.0* 8.4   Liver Function Tests:  Recent Labs Lab 07/26/14 1741  AST 22  ALT 13  ALKPHOS 52  BILITOT 0.6  PROT 6.1  ALBUMIN 3.1*   No results for input(s): LIPASE, AMYLASE in the last 168 hours. No results for input(s): AMMONIA in the last 168 hours. CBC:  Recent Labs Lab 07/26/14 1312 07/27/14 0435  WBC 5.7 6.3  HGB 13.4 11.3*  HCT 38.6 33.1*  MCV 79.9 80.1  PLT 296 265   Cardiac Enzymes: No results for input(s): CKTOTAL, CKMB, CKMBINDEX, TROPONINI in the last 168 hours. BNP: No results for  input(s): PROBNP in the last 168 hours. D-Dimer: No results for input(s): DDIMER in the last 168 hours. CBG:  Recent Labs Lab 07/26/14 1710 07/26/14 1810 07/26/14 1851 07/26/14 2158 07/27/14 0805  GLUCAP 317* 203* 196* 305* 224*   Hemoglobin A1C: No results for input(s): HGBA1C in the last 168 hours. Fasting Lipid Panel: No results for input(s): CHOL, HDL, LDLCALC, TRIG, CHOLHDL, LDLDIRECT in the last 168 hours. Thyroid Function Tests: No results for input(s): TSH, T4TOTAL, FREET4, T3FREE, THYROIDAB in the last 168 hours. Coagulation: No results for input(s): LABPROT, INR in the last 168 hours. Anemia Panel: No results for input(s): VITAMINB12, FOLATE, FERRITIN, TIBC, IRON, RETICCTPCT in the last 168 hours. Urine Drug Screen: Drugs of Abuse     Component Value Date/Time   LABOPIA NONE DETECTED 08/09/2013 0005   LABOPIA NEGATIVE 12/18/2009 1453   COCAINSCRNUR NONE DETECTED 08/09/2013 0005   COCAINSCRNUR NEGATIVE 12/18/2009 1453   LABBENZ NONE DETECTED 08/09/2013 0005   LABBENZ NEGATIVE 12/18/2009 1453   AMPHETMU NONE DETECTED 08/09/2013 0005   AMPHETMU NEGATIVE 12/18/2009 1453   THCU NONE DETECTED 08/09/2013 0005   LABBARB NONE DETECTED 08/09/2013 0005    Alcohol Level: No results for input(s): ETH in the last 168 hours. Urinalysis:  Recent Labs  Lab 07/26/14 1616  COLORURINE YELLOW  LABSPEC 1.040*  PHURINE 6.0  GLUCOSEU >1000*  HGBUR NEGATIVE  BILIRUBINUR NEGATIVE  KETONESUR 15*  PROTEINUR NEGATIVE  UROBILINOGEN 0.2  NITRITE NEGATIVE  LEUKOCYTESUR NEGATIVE    Micro Results: No results found for this or any previous visit (from the past 240 hour(s)). Studies/Results: Dg Chest 2 View  07/26/2014   CLINICAL DATA:  Cough and chest pain for 1 week  EXAM: CHEST  2 VIEW  COMPARISON:  December 26, 2013  FINDINGS: Lungs are clear. Heart size and pulmonary vascularity are normal. No adenopathy. No pneumothorax. No bone lesions.  IMPRESSION: No edema or consolidation.    Electronically Signed   By: Lowella Grip III M.D.   On: 07/26/2014 13:47   Medications: I have reviewed the patient's current medications. Scheduled Meds: . heparin  5,000 Units Subcutaneous 3 times per day  . insulin aspart  0-5 Units Subcutaneous QHS  . insulin aspart  0-9 Units Subcutaneous TID WC  . insulin aspart  3 Units Subcutaneous TID WC  . insulin glargine  15 Units Subcutaneous BID  . pneumococcal 23 valent vaccine  0.5 mL Intramuscular Tomorrow-1000   Continuous Infusions: . sodium chloride 1,000 mL (07/27/14 0730)   PRN Meds:.acetaminophen **OR** acetaminophen, ondansetron **OR** ondansetron (ZOFRAN) IV Assessment/Plan: Active Problems:   Diabetes mellitus type 1 (uncontrolled)   Hyperglycemia   AKI (acute kidney injury)  Hyperosmolar hyperglycemic state in the setting of type 1 diabetes: Resolved. Patient's glucose this morning was 179 status post fluid resuscitation and insulin. -Patient stable for discharge today. -We'll resume home regimen of medications at home.  Chest pain: Patient denies any recurrent episodes of chest pain. She did report that GI cocktail may have helped her symptoms. -We will trial a PPI for discharge.  Diet: Carb modified Prophylaxis: heparin 5000 u Q8H Code: Full  Dispo: Disposition is deferred at this time, awaiting improvement of current medical problems. Anticipated discharge in approximately 0 day(s).   The patient does have a current PCP Cresenciano Genre, MD) and does need an [facility] hospital follow-up appointment after discharge.  The patient does not have transportation limitations that hinder transportation to clinic appointments.     Services Needed at time of discharge: Y = Yes, Blank = No PT:   OT:   RN:   Equipment:   Other:    Luan Moore, MD 07/27/2014, 9:07 AM

## 2014-07-27 NOTE — Progress Notes (Signed)
Subjective: Patient was alert and lying in bed. She reported that the GI cocktail helped with her pain. She denied any chest pain, headaches, nausea or vomiting since admission. There were no acute events overnight.   Objective: Vital signs in last 24 hours: Filed Vitals:   07/26/14 1715 07/26/14 1730 07/26/14 2100 07/27/14 0453  BP: 124/87 119/78 124/83 118/75  Pulse: 105 93 99 96  Temp:   97.8 F (36.6 C) 97.8 F (36.6 C)  TempSrc:   Oral Oral  Resp:   17 17  Height:   5' 4.02" (1.626 m)   Weight:   48.1 kg (106 lb 0.7 oz)   SpO2: 100% 100% 100% 100%   Weight change:   Intake/Output Summary (Last 24 hours) at 07/27/14 1025 Last data filed at 07/27/14 0610  Gross per 24 hour  Intake   1824 ml  Output      0 ml  Net   1824 ml   BP 118/75 mmHg  Pulse 96  Temp(Src) 97.8 F (36.6 C) (Oral)  Resp 17  Ht 5' 4.02" (1.626 m)  Wt 48.1 kg (106 lb 0.7 oz)  BMI 18.19 kg/m2  SpO2 100%  LMP 07/13/2014 General appearance: alert, lying in bed resting Eyes: EOMI; no scleral icterus  Chest: non-tender to palpation Lungs: CTAB; no wheezing or rales Heart: RRR; nl S1/S2; no murmurs, rubs or gallops Abdomen: NABS; soft, non-tender to palpation Neurologic: Grossly normal   Lab Results: BMP Latest Ref Rng 07/27/2014 07/26/2014 07/26/2014  Glucose 70 - 99 mg/dL 179(H) 279(H) 708(HH)  BUN 6 - 23 mg/dL 5(L) 6 6  Creatinine 0.50 - 1.10 mg/dL 0.45(L) 0.65 0.94  Sodium 135 - 145 mmol/L 135 136 127(L)  Potassium 3.5 - 5.1 mmol/L 3.6 3.4(L) 3.9  Chloride 96 - 112 mmol/L 108 107 94(L)  CO2 19 - 32 mmol/L 22 22 22   Calcium 8.4 - 10.5 mg/dL 8.4 8.0(L) 9.2   CBC Latest Ref Rng 07/27/2014 07/26/2014 04/06/2014  WBC 4.0 - 10.5 K/uL 6.3 5.7 6.2  Hemoglobin 12.0 - 15.0 g/dL 11.3(L) 13.4 14.5  Hematocrit 36.0 - 46.0 % 33.1(L) 38.6 40.6  Platelets 150 - 400 K/uL 265 296 332   Studies/Results: Dg Chest 2 View  07/26/2014   CLINICAL DATA:  Cough and chest pain for 1 week  EXAM: CHEST  2 VIEW   COMPARISON:  December 26, 2013  FINDINGS: Lungs are clear. Heart size and pulmonary vascularity are normal. No adenopathy. No pneumothorax. No bone lesions.  IMPRESSION: No edema or consolidation.   Electronically Signed   By: Lowella Grip III M.D.   On: 07/26/2014 13:47   Medications: I have reviewed the patient's current medications. Scheduled Meds: . heparin  5,000 Units Subcutaneous 3 times per day  . insulin aspart  0-5 Units Subcutaneous QHS  . insulin aspart  0-9 Units Subcutaneous TID WC  . insulin aspart  3 Units Subcutaneous TID WC  . insulin glargine  15 Units Subcutaneous BID  . pneumococcal 23 valent vaccine  0.5 mL Intramuscular Tomorrow-1000   Continuous Infusions: . sodium chloride 1,000 mL (07/27/14 0730)   PRN Meds:.acetaminophen **OR** acetaminophen, ondansetron **OR** ondansetron (ZOFRAN) IV Assessment/Plan: Active Problems:   Diabetes mellitus type 1 (uncontrolled)   Hyperglycemia   AKI (acute kidney injury) Ashley Freeman is a 24 year old female with a history of poorly controlled type I diabetes mellitus who presented with a one-week history of chest pain who is currently being treated for poor glycemic control and GERD-like symptoms.  Chest Pain: The patient's chest pain improved after the GI cocktail suggesting the pain was more consistent with a gastrointestinal source as opposed to cardiac origin. - Start PPI on discharge  Poorly controlled diabetes mellitus type I: Patient's serum glucose has decreased to 179 on morning labs from 708 on admission. The importance of monitoring her blood glucose and decreasing it in the future was stressed upon the patient. - Continue 170mL/hr continuous infusion until discharge - Start sliding scale insulin, novolog - Start Lantus 15 units BID - Resume outpatient medications on discharge  Acute Kidney Injury: AKI seems to have resolved. SCr on BMET this morning was down to 0.45 from 0.94 on admission  FEN: Patient  became hypokalemic down to 3.4 yesterday likely due to volume repletion and insulin treatments. K-dur was ordered and patient's potassium was 3.6 on morning BMP.  Diet: Carb modified Prophylaxis: Heparin  Dispo: Disposition considered for today.   This is a Careers information officer Note.  The care of the patient was discussed with Dr. Raelene Bott and the assessment and plan formulated with their assistance.  Please see their attached note for official documentation of the daily encounter.     Gery Pray, Med Student 07/27/2014, 10:25 AM

## 2014-07-28 LAB — HEMOGLOBIN A1C
HEMOGLOBIN A1C: 16.4 % — AB (ref 4.8–5.6)
Hgb A1c MFr Bld: 16.2 % — ABNORMAL HIGH (ref 4.8–5.6)
Mean Plasma Glucose: 424 mg/dL
Mean Plasma Glucose: 418 mg/dL
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
## 2014-08-02 ENCOUNTER — Telehealth: Payer: Self-pay | Admitting: Internal Medicine

## 2014-08-02 ENCOUNTER — Telehealth: Payer: Self-pay | Admitting: General Practice

## 2014-08-02 NOTE — Telephone Encounter (Signed)
error 

## 2014-08-02 NOTE — Telephone Encounter (Signed)
Call to patient to confirm appointment for 08/03/14 at 10:45 lmtcb

## 2014-08-03 ENCOUNTER — Encounter: Payer: Self-pay | Admitting: Internal Medicine

## 2014-08-03 ENCOUNTER — Ambulatory Visit: Payer: Self-pay | Admitting: Internal Medicine

## 2014-08-21 NOTE — Consult Note (Signed)
Brief Consult Note: Diagnosis: dka.   Patient was seen by consultant.   Recommend further assessment or treatment.   Discussed with Attending MD.   Comments: No obvious recurrent perirectal abscess but with pain and acidosis, may warrant EUA once electrolytes corrected. will follow.  Electronic Signatures: Florene Glen (MD)  (Signed 14-Jul-15 23:15)  Authored: Brief Consult Note   Last Updated: 14-Jul-15 23:15 by Florene Glen (MD)

## 2014-08-21 NOTE — H&P (Signed)
PATIENT NAME:  Ashley Freeman, SAFFLE MR#:  [id number] DATE OF BIRTH:  1990-12-10  DATE OF ADMISSION:  11/10/2013  REFERRING PHYSICIAN: Dr. Dorothea Glassman.  PRIMARY CARE PHYSICIAN: Dr. Fransico Michael, endocrinology in [place].   CHIEF COMPLAINT: Tenderness at previous site of perirectal abscess and hyperglycemia.   HISTORY OF PRESENT ILLNESS: This is a 24 year old female with known history of diabetes, who presents with complaints of tenderness at the site of previous perirectal abscess. The patient was operated x 2 in [facility] for drainage of her abscess site. She presents with tenderness at site. She denies any fever or any chills. She did not have any leukocytosis. The patient was found to have hyperglycemia with anion gap of 15 and a bicarbonate level of 20, and pH level of 7.25. As well, patient was found to have uncontrolled glucose at 604. The patient had tenderness at the site of perirectal abscess, which was examined. She had tenderness at site but no fluid could be appreciated beneath that. Hospitalist service requested to admit the patient for further treatment of her hyperglycemia and as well, she was noticed to have UTI. As well, for further evaluation of her tenderness at perirectal abscess site. The patient denies any nausea, vomiting, chest pain, shortness of breath, coffee-ground emesis, diarrhea, or constipation.   PAST MEDICAL HISTORY:  1. Type 1 diabetes since the age of 82.  2. History of hypertension.   ALLERGIES: AUGMENTIN, LISINOPRIL, PENICILLIN, VICODIN, BUT SHE DOES TOLERATE PERCOCET.   HOME MEDICATIONS: Lantus 35 units subcutaneous daily. NovoLog 6 units 3 times a day before meals.  PAST SURGICAL HISTORY:  1. Tonsillectomy and adenoidectomy.   2. Recent I and D for perirectal abscess x 2.   SOCIAL HISTORY: The patient lives with her mother. No smoking. No alcohol. No drug use.   FAMILY HISTORY: Significant for diabetes in her grandmother.   REVIEW OF SYSTEMS:   CONSTITUTIONAL: The patient denies fever, chills, complains of fatigue, weakness.  EYES: Denies blurry vision, double vision, inflammation. EARS, NOSE, AND THROAT: Denies tinnitus, ear pain, hearing loss, epistaxis. RESPIRATORY: Denies cough, wheezing, hemoptysis.  CARDIOVASCULAR: Denies chest pain, syncope, presyncopal palpitations.  GASTROINTESTINAL: Reports nausea, vomiting, abdominal pain.  GENITOURINARY: Denies dysuria, hematuria, renal colic.  ENDOCRINE: Denies polyuria, polydipsia, heat or cold intolerance.  HEMATOLOGY: Denies anemia, easy bruising, bleeding diathesis.  INTEGUMENT: Denies any new skin rash, itching. Reports tenderness and pain at site of previous perirectal abscess drainage.  MUSCULOSKELETAL: No gout, no arthritis, no cramps.  NEUROLOGIC: Denies any focal deficits, tingling, numbness, headache, dizziness, lightheadedness.  PSYCHIATRIC: Denies anxiety, insomnia, or depression.   PHYSICAL EXAMINATION:  VITAL SIGNS: Temperature 98.6, pulse 103, respiratory rate 19, blood pressure 137/92, saturating 100% on room air. GENERAL: A well-nourished female, looks comfortable in bed. In no apparent distress. HEENT: Head atraumatic, normocephalic. Pupils are equal and reactive to light. Pink conjunctivae. Anicteric sclerae. Dry oral mucosa.  NECK: Supple. No thyromegaly. No JVD.  CHEST: Good air entry bilaterally. No wheezing, rales, or rhonchi.  CARDIOVASCULAR: S1, S2 heard. No rubs, murmurs, gallops.  ABDOMEN: Soft, nontender, nondistended. Bowel sounds present.  EXTREMITIES: No edema. No clubbing. No cyanosis. Pedal and radial pulses felt bilaterally.  PSYCHIATRIC: Appropriate affect. Awake, alert x 3. Intact judgment and insight.  NEUROLOGIC: Cranial nerves grossly intact. Motor 5/5. No focal deficits.  SKIN: Normal skin turgor. Warm and dry. Patient had examination of the tenderness at the perirectal abscess where she had tenderness to palpation with no drainage  scarred and  no oozing at the medial gluteal inferior surface, has scar formation at previous surgical site. MUSCULOSKELETAL: No joint effusion or erythema.   PERTINENT LABORATORY DATA: Glucose 604, BUN 13, creatinine 0.71, sodium 130, potassium 4.1, chloride 95, CO2 of 20, ALT 10, AST 12, alkaline phosphatase 77, white blood cell 8.9, hemoglobin 13.8, hematocrit 43.9, platelets 287,000.   Urinalysis: +1 leukocyte esterase, 14 white blood cells. ABG showing pH of 7.25.   ASSESSMENT AND PLAN:  1. Uncontrolled diabetes with hyperglycemia and mild diabetic ketoacidosis. As patient presents with metabolic acidosis and low bicarbonate and borderline anion gap, the patient will be started on insulin drip. We will continue on aggressive IV fluid hydration. We will monitor electrolytes and replace frequently.  2. Urinary tract infection. The patient will be started on IV antibiotics.  3. History of abscess, perirectal area with tenderness at site. Surgical consult appreciated. Discussed with surgery. Will start the patient on IV Cefepime and Flagyl for coverage pending further workup.  4. Episode of hyponatremia within normal limits once corrected.  5. Deep vein thrombosis prophylaxis. Subcutaneous heparin   CODE STATUS: Full code.   TOTAL TIME SPENT ON ADMISSION AND PATIENT CARE: 55 minutes.    ____________________________ Starleen Arms, MD dse:lt D: 11/11/2013 00:32:57 ET T: 11/11/2013 01:52:16 ET JOB#: [id number]  cc: Starleen Arms, MD, <Dictator> Anae Hams Teena Irani MD ELECTRONICALLY SIGNED 11/14/2013 15:45

## 2014-08-21 NOTE — H&P (Signed)
PATIENT NAME:  Ashley Freeman, Ashley Freeman MR#:  [id number] DATE OF BIRTH:  06/05/1990  DATE OF ADMISSION:  11/10/2013  ADDENDUM:  Patient examination and history was obtained in the presence of female chaperone, a CNA, Derl Barrow.    ____________________________ Albertine Patricia, MD dse:lt D: 11/11/2013 01:35:33 ET T: 11/11/2013 04:41:19 ET JOB#: [id number]  cc: Albertine Patricia, MD, <Dictator> Adriauna Campton Graciela Husbands MD ELECTRONICALLY SIGNED 11/14/2013 15:45

## 2014-08-21 NOTE — Discharge Summary (Signed)
PATIENT NAME:  Ashley Freeman, Ashley Freeman MR#:  [id number] DATE OF BIRTH:  1990/09/08  DATE OF ADMISSION:  11/10/2013 DATE OF DISCHARGE:  11/12/2013  ADMITTING DIAGNOSIS: Diabetic ketoacidosis.   DISCHARGE DIAGNOSES:  1. Diabetic ketoacidosis, diabetes mellitus type 1, poorly controlled, with hemoglobin A1c of 14.6.  2. Urinary tract infection. 3. Dysuria. 4. Hypomagnesemia.  5. History of hypertension.   DISCHARGE CONDITION: Stable.   DISCHARGE MEDICATIONS: The patient is to continue NovoLog 6 units 3 times daily before meals, insulin Lantus 45  units subcutaneously at bedtime, hydrocortisone 25 mg rectal suppository 4 times daily as needed, phenazopyridine 100 mg three  times daily for 2 more days, Levaquin 500 mg p.o. daily for 6 more days.   HOME OXYGEN: None.   DIET: 2-gram salt, low-fat, low-cholesterol, carbohydrate-controlled diet, regular consistency.   ACTIVITY LIMITATIONS: As tolerated.    FOLLOWUP APPOINTMENTS:  Dr. Tobe Sos in 2-3 days .  CONSULTANTS: care manager, Social Work, Drs. Johnston Ebbs, Dietary.  RADIOLOGIC STUDIES: None.  HISTORY OF PRESENT ILLNESS: The patient is a 24 year old African American female with history of diabetes mellitus type 1, who presents to the hospital on 11/10/2013 with complaints of perirectal pain. Please refer to Dr. Graciela Husbands admission note on 11/10/2013.   On arrival to the hospital, the patient's temperature was about 98.6, pulse 103, respiration rate was 19, blood pressure 137/92, saturation was 100% on room air.   Physical examination was unremarkable including perirectal examination. Rectal examination revealed a scar formation with no oozing and no other abnormalities.   The patient's laboratory data done on arrival to the Emergency Room showed elevated glucose to the level of 604, sodium 130, otherwise BMP was unremarkable. The patient's bicarbonate level was low at 20. The patient's beta hydroxybutyrate level was more than 46.  Liver enzymes were normal. The patient's white blood cell count was normal at 8.9, hemoglobin was 13.8, platelet count 287,000. Absolute neutrophil count was normal. Urine culture did not show any growth, but the white blood cell count was elevated to 14 and red blood cell count was 9; 1+ leukocyte esterase was noted. The patient's pH was 7.25 and pCO2 was 42 on venous blood.   The patient was admitted to the hospital with a diagnosis of a urinary tract infection, as well as DKA. She was initiated on an insulin IV drip and her acidosis resolved. She was also given plenty of IV fluids and her hyponatremia resolved, as well. Her hemoglobin A1c was checked and was found to be markedly abnormal at 14.6.   The patient was consulted by [facility], and it appeared that, in fact, however, she is not using her insulin appropriately since she does not have a physician. The hospital will arrange [facility] followup for this patient and she is going to be referred to an endocrinologist, or if she wants to, she can return back to Dr. Margot Ables, endocrinologist at [facility].   In regards to the urinary tract infection, although the patient's urine cultures came back negative, the patient became much more comfortable with IV antibiotics. She is being discharged home with antibiotics for 6 more days. She is also to continue medications for dysuria, as well as hydrocortisone suppositories. It is unclear why she was having discomfort in the perirectal area; however, it was felt that the dysuria could have given her that discomfort. The patient was evaluated by Surgery and they felt that the patient has no recurrent  abscess.   On the day  of discharge, the patient felt satisfactory, did not complain of any significant discomfort. She is being discharged home with the above-mentioned medications and follow-up.   On the day of discharge, her vital signs, temperature was 98.7, pulse was 94, respiration  rate was 18, blood pressure 122/78, saturation was 98% on room air at rest.   TIME SPENT: 40 minutes.    ____________________________ Theodoro Grist, MD rv:jr D: 11/12/2013 18:47:27 ET T: 11/12/2013 20:01:08 ET JOB#: [id number]  cc: Theodoro Grist, MD, <Dictator> Lysle Morales, MD Theodoro Grist MD ELECTRONICALLY SIGNED 11/25/2013 14:43

## 2014-08-22 NOTE — Discharge Summary (Signed)
PATIENT NAME:  Ashley Freeman, Ashley Freeman MR#:  [id number] DATE OF BIRTH:  06/20/1990  DATE OF ADMISSION:  09/10/2011 DATE OF DISCHARGE:  09/13/2011  ADMITTING DIAGNOSIS: Not feeling well.   DISCHARGE DIAGNOSES:  1. Diabetic ketoacidosis due to medication noncompliance.  2. Diabetes type 1, very poorly controlled.  3. History of hypertension and hypotensive during hospitalization and will be off antihypertensives.  4. Acute renal failure due to dehydration, now resolved.  5. Leukocytosis, reactive in nature, likely due to diabetic ketoacidosis, now normalized. The patient has been afebrile.  6. Medication noncompliance. 7. Anemia, likely anemia of chronic disease. The patient needs outpatient followup.   PERTINENT LABS AND EVALUATIONS: Admitting glucose 772, BUN 26, creatinine 1.44, sodium 132, potassium 5.6, chloride 95, and CO2 7. Anion gap 30. Lipase 55. LFTs: Total protein 9.4, albumin 4.6, bilirubin total 0.6, alkaline phosphatase 122, AST 40, and ALT 26. WBC 29.5, hemoglobin 15.1, and platelet count 352.   Urinalysis was nitrite negative, leukocytes negative.   Pregnancy test was negative   ABG: pH 6.92.   Pregnancy test was negative. Random cortisol level was 9.5. Most recent WBC count on 09/13/2011 with WBC 7.0, hemoglobin 10.2, and platelet count 212.  Hemoglobin A1c was sent to the lab, unable to calculate.   HOSPITAL COURSE: Please see the history and physical done by the admitting physician. The patient is a 24 year old African American female with poorly controlled diabetes. Her last hemoglobin A1c was 15.5, in May 2012. The patient does not follow with her endocrinologist and has missed about five appointments. The patient had not been using her insulin for the last few days because she was in the process of moving to [place]. In the ED, she was noted to have blood sugar of 772 and anion gap of 35. We were asked to admit the patient for DKA. The patient was noted to have  leukocytosis and was very volume depleted. The patient was started on insulin drip with close monitoring of her blood sugars and electrolytes. The patient was given IV hydration as well. When her anion gap closed she was switched over to Lantus. Her sugars have improved significantly with the current regimen. She also had leukocytosis without having any fevers. Her urinalysis was negative. Her chest x-ray was negative for any acute infection. It was felt likely reactive in nature. She was also hypotensive so she was given IV fluids. At this time she is doing much better and is stable for discharge.   DISCHARGE MEDICATIONS:  1. All-Day Allegra 10 mg daily.  2. Lantus 20 units subcutaneous at bedtime.  3. NovoLog 4 units subcutaneous before meals.   DISCHARGE ACTIVITY: As tolerated.   DISCHARGE DIET: ADA 1800 calorie diet.   DISCHARGE INSTRUCTIONS/FOLLOWUP: The patient has been arranged to follow up with the local clinic here. She is encouraged to follow-up and take her medications.  TIME SPENT: 35 minutes. ____________________________ Lafonda Mosses Posey Pronto, MD shp:slb D: 09/13/2011 13:39:13 ET T: 09/13/2011 14:49:14 ET JOB#: [id number]  cc: Shaneil Yazdi H. Posey Pronto, MD, <Dictator> Alric Seton MD ELECTRONICALLY SIGNED 09/14/2011 14:45

## 2014-08-22 NOTE — H&P (Signed)
PATIENT NAME:  Ashley Freeman, Ashley Freeman MR#:  [id number] DATE OF BIRTH:  [birth year]  DATE OF ADMISSION:  09/10/2011  ADDENDUM: The patient has hyperkalemia secondary to her acidotic state at this time. It should correct with insulin and aggressive IV hydration. We are going to repeat a BMP in 5 to 6 hours on the insulin drip. She is also pseudo-hyponatremic and also in acute renal failure most likely secondary to her dehydrated state at this time.  ____________________________ Mena Pauls, MD ag:slb D: 09/10/2011 17:28:58 ET T: 09/10/2011 17:33:21 ET JOB#: [id number]  cc: Mena Pauls, MD, <Dictator> Mena Pauls MD ELECTRONICALLY SIGNED 10/06/2011 15:22

## 2014-08-22 NOTE — H&P (Signed)
PATIENT NAME:  Ashley Freeman, Ashley Freeman MR#:  [id number] DATE OF BIRTH:  05/16/1990  DATE OF ADMISSION:  09/10/2011  PRIMARY CARE PHYSICIAN: Dr. Tobe Sos, Endocrinologist in [place].   CHIEF COMPLAINT: I did not feel good today.   HISTORY OF PRESENT ILLNESS: The patient is a 24 year old female with poorly controlled diabetes. Her last hemoglobin A1c was 15.4 in May of 2012. She does not know any recent hemoglobin A1c, does not follow with an endocrinologist regularly. Today she comes in because she is not feeling good since morning. She was vomiting. She was nauseated. She said she takes Lantus 20 units twice a day along with NovoLog sliding scale. She has not been using that for the last two or three days because she is in the process of moving to [place]. In the Emergency Room, her sugar was found to be greater than 600, and her BMP shows that her sugar was 772 with a bicarbonate of 7 and a gap of 35. So the patient is in diabetic ketoacidosis. She denies any fever, any cough, any shortness of breath, any abdominal pain. She denies any dysuria; but she says she was peeing a lot, and she is very dry and thirsty, asking for fluids. She already received 2 liters of normal saline bolus in the Emergency Room; and she is getting a second liter of normal saline bolus right now, and she has been started on insulin drip.   PAST MEDICAL HISTORY:  1. Type 1 diabetes since the age of 48. Her last admission with diabetic ketoacidosis was in May of 2012 when her hemoglobin A1c was found to be 15.4.  2. History of hypertension.   ALLERGIES: She is allergic to Augmentin and lisinopril.   HOME MEDICATIONS:   1. She takes Lantus 20 units subcutaneous in the morning and 20 units at bedtime.  2. NovoLog sliding scale.  3. She says she takes lisinopril, but it also listed as an allergy.   PAST SURGICAL HISTORY: Tonsillectomy and adenoidectomy.    SOCIAL HISTORY: She lives with her mother. She denies any  smoking, alcohol or drug use.   FAMILY HISTORY: She says her great grandmother had diabetes. No other medical problems in the family.   REVIEW OF SYSTEMS: CONSTITUTIONAL: She is complaining of weakness. No fever. No acute change in vision. No dizziness. No headache. No cough. No dyspnea. CARDIOVASCULAR: No chest pain. GASTROINTESTINAL: She is complaining of nausea, vomiting, but no diarrhea. No abdominal pain. GENITOURINARY: No dysuria, frequency, but complaining of polyuria. ENDOCRINE: No thyroid problems. HEMATOLOGIC: No anemia. INTEGUMENT: No rash. MUSCULOSKELETAL: No joint pains or swelling. NEUROLOGIC: No focal numbness or weakness. No anxiety.   PHYSICAL EXAMINATION:  VITAL SIGNS: When she presented to the Emergency Room, temperature was 97, heart rate 130, respiratory rate 22, blood pressure 110/76, saturating 99% on room air.   GENERAL: This is a young [place] American female. She is very dry and thirsty at this time.   HEENT: Bilateral pupils are equal. Extraocular muscles are intact. No scleral icterus. No conjunctivitis. Oral mucosa is completely dry.   NECK: No thyroid tenderness, enlargement or nodule. Neck is supple. No masses, nontender. No adenopathy. No JVD. No carotid bruits.   CHEST: Bilateral breath sounds are clear. No wheeze. Normal effort. No respiratory distress, but she is tachypneic.    HEART: Heart sounds are regular, tachycardic. No murmur. Good peripheral pulses. No lower extremity edema.   ABDOMEN: Soft, nontender. Normal bowel sounds. No hepatomegaly. No bruit. No masses.  RECTAL: Exam is deferred.   NEUROLOGIC: She is awake, alert. She is oriented to time, place, and person. Cranial nerves are intact. Moving all extremities against gravity.   EXTREMITIES: No cyanosis. No clubbing.   SKIN: No rash. No lesions. Skin is dry.  LABORATORY, DIAGNOSTIC AND RADIOLOGICAL DATA:  White count of 29.5, hemoglobin 15.1, platelet count of 352,000. Her baseline  hemoglobin was probably around 11.2 on 09/23/2010.  BMP: Sodium 132, potassium 5.6, BUN 26, creatinine 1.44, serum bicarbonate of 7, gap of 30. LFTs are normal. AST is slightly elevated at 40. Lipase is 55. Glucose is 772.   Arterial blood gas shows pH of 6.92.   IMPRESSION:  1. Diabetic ketoacidosis with type 1 diabetes.  2. Leukocytosis most likely secondary to dehydration and diabetic ketoacidosis.  3. Acute renal failure, most likely prerenal with dehydration, also hemoconcentrated.  4. Mild hypokalemia secondary to diabetic ketoacidosis.  5. History of hypertension, noncompliant.   PLAN: A 24 year old female who has history of poorly-controlled diabetes, also history of hypertension. Her last admission for diabetic ketoacidosis was in May of 2012. The patient again presented with not feeling well. She has not  taken her insulin for the last three days. She was found to have sugars of greater than 700. She is extremely dehydrated at this time. Her bicarbonate is 7, and a gap is 30. She got 2 liters of normal saline bolus in the Emergency Room. I am going to start her on normal saline at 200 mL/hr and will start her on insulin drip per DKA protocol. We will also check an HbA1c on her. A urinalysis is pending at this time. Her leukocytosis is most likely secondary to dehydration and diabetic ketoacidosis, but we will also get a chest x-ray on her. She says she has not seen her endocrinologist for months now. I advised her to be compliant with her medications. Her urinalysis and urine pregnancy test are pending at this time. The patient will be admitted to the Intensive Care Unit for insulin drip.  TIME SPENT:   Time spent with admission and coordination of care was 45 minutes.    ____________________________ Mena Pauls, MD ag:cbb D: 09/10/2011 17:25:05 ET T: 09/10/2011 18:02:06 ET JOB#: [id number]  cc: Mena Pauls, MD, <Dictator> Dr. Tobe Sos in [place], [place]  MD ELECTRONICALLY SIGNED 10/06/2011 15:22

## 2014-09-10 ENCOUNTER — Encounter (HOSPITAL_COMMUNITY): Payer: Self-pay | Admitting: Family Medicine

## 2014-09-10 ENCOUNTER — Emergency Department (HOSPITAL_COMMUNITY)
Admission: EM | Admit: 2014-09-10 | Discharge: 2014-09-10 | Disposition: A | Discharge status: Home / Self Care | Payer: Medicaid Other | Attending: Emergency Medicine | Admitting: Emergency Medicine

## 2014-09-10 DIAGNOSIS — Z79899 Other long term (current) drug therapy: Secondary | ICD-10-CM | POA: Insufficient documentation

## 2014-09-10 DIAGNOSIS — L989 Disorder of the skin and subcutaneous tissue, unspecified: Secondary | ICD-10-CM

## 2014-09-10 DIAGNOSIS — Z794 Long term (current) use of insulin: Secondary | ICD-10-CM | POA: Insufficient documentation

## 2014-09-10 DIAGNOSIS — Z8619 Personal history of other infectious and parasitic diseases: Secondary | ICD-10-CM | POA: Insufficient documentation

## 2014-09-10 DIAGNOSIS — Z880 Allergy status to penicillin: Secondary | ICD-10-CM | POA: Insufficient documentation

## 2014-09-10 DIAGNOSIS — E119 Type 2 diabetes mellitus without complications: Secondary | ICD-10-CM | POA: Insufficient documentation

## 2014-09-10 MED ORDER — DIPHENHYDRAMINE HCL 25 MG PO TABS: 25.0000 mg | ORAL_TABLET | Freq: Four times a day (QID) | ORAL | Status: DC

## 2014-09-10 MED ORDER — PYRITHIONE ZINC 1 % EX SHAM
MEDICATED_SHAMPOO | Freq: Every day | CUTANEOUS | Status: DC | PRN
Start: 2014-09-10 — End: 2015-01-03

## 2014-09-10 NOTE — ED Notes (Signed)
Pt here for sores on her scalp x 1 week. sts they itch and painful.

## 2014-09-10 NOTE — ED Provider Notes (Signed)
CSN: [id number]     Arrival date & time 09/10/14  1220 History  This chart was scribed for non-physician practitioner, Al Corpus, working with Orpah Greek, MD by Molli Posey, ED Scribe. This patient was seen in room TR07C/TR07C and the patient's care was started at 12:56 PM.   Chief Complaint  Patient presents with  . Wound Check   The history is provided by the patient. No language interpreter was used.   HPI Comments: Ashley Freeman is a 24 y.o. female with a history of DM and DKA who presents to the Emergency Department complaining of sores on her scalp for the last week. Pt states that the areas are itchy and painful. She states that she has been scratching the areas some. Pt states that doing her hair worsens her pain.  Pt states that she had her hair braided after the sores presented and states she took her braids out last night. She reports no similar prior episodes. Pt reports no rash in any other locations. She reports no new soaps, detergents or any other new exposures. She reports that she is allergic to penicillins. She denies any discharge, fevers, chills, difficulty swallowing, CP, nausea or vomiting.    Past Medical History  Diagnosis Date  . Gonorrhea 08/2011    Treated in 09/2011  . Diabetes mellitus 2001    Diagnosed at age 7 ; Type I  . DKA (diabetic ketoacidoses) 08/19/2013   Past Surgical History  Procedure Laterality Date  . No past surgeries    . Incision and drainage perirectal abscess Right 08/18/2013    Procedure: IRRIGATION AND DEBRIDEMENT GLUTEAL ABSCESS;  Surgeon: Ralene Ok, MD;  Location: [facility];  Service: General;  Laterality: Right;  . Incision and drainage perirectal abscess Right 09/19/2013    Procedure: IRRIGATION AND DEBRIDEMENT RIGHT GLUTEAL AND LABIAL ABSCESSES;  Surgeon: Ralene Ok, MD;  Location: [facility];  Service: General;  Laterality: Right;  . Incision and drainage perirectal abscess Right 09/24/2013    Procedure:  IRRIGATION AND DEBRIDEMENT PERIRECTAL ABSCESS;  Surgeon: Gwenyth Ober, MD;  Location: [facility];  Service: General;  Laterality: Right;   Family History  Problem Relation Age of Onset  . Anesthesia problems Neg Hx   . Asthma Mother   . Gout Father   . Diabetes Paternal Grandmother    History  Substance Use Topics  . Smoking status: Never Smoker   . Smokeless tobacco: Never Used  . Alcohol Use: Yes     Comment: occ   OB History    Gravida Para Term Preterm AB TAB SAB Ectopic Multiple Living   1 0   1  1   0     Review of Systems  Constitutional: Negative for fever and chills.  Cardiovascular: Negative for chest pain.  Gastrointestinal: Negative for nausea and vomiting.  Skin: Positive for rash.   Allergies  Penicillins  Home Medications   Prior to Admission medications   Medication Sig Start Date End Date Taking? Authorizing Provider  acetaminophen (TYLENOL) 325 MG tablet Take 1 tablet (325 mg total) by mouth every 6 (six) hours as needed for mild pain or moderate pain. 07/27/14   Luan Moore, MD  insulin aspart (NOVOLOG) 100 UNIT/ML injection Inject 2-10 Units into the skin 2 (two) times daily as needed for high blood sugar (CBG over 100). Per sliding scale    Historical Provider, MD  Insulin Glargine (LANTUS SOLOSTAR) 100 UNIT/ML Solostar Pen Inject 20 Units into the skin 2 (  two) times daily. Breakfast and dinner    Historical Provider, MD  omeprazole (PRILOSEC) 20 MG capsule Take 1 capsule (20 mg total) by mouth daily. 07/27/14 08/24/14  Luan Moore, MD   BP 129/83 mmHg  Pulse 95  Temp(Src) 97.9 F (36.6 C)  Resp 18  SpO2 100%  LMP 08/19/2014 Physical Exam  Constitutional: She is oriented to person, place, and time. She appears well-developed and well-nourished. No distress.  HENT:  Head: Normocephalic and atraumatic.  Eyes: Conjunctivae are normal. Right eye exhibits no discharge. Left eye exhibits no discharge.  Neck: Neck supple. No tracheal deviation present.   Pulmonary/Chest: Effort normal. No respiratory distress.  Abdominal: She exhibits no distension.  Neurological: She is alert and oriented to person, place, and time. Coordination normal.  Skin: She is not diaphoretic.  Multiple 0.5cm scabs to right temporal scalp with diffuse dandruff. No erythema, redness, fluctuance or mass. No rash elsewhere  Psychiatric: She has a normal mood and affect. Her behavior is normal.  Nursing note and vitals reviewed.   ED Course  Procedures   DIAGNOSTIC STUDIES: Oxygen Saturation is 100% on RA, normal by my interpretation.    COORDINATION OF CARE: 1:01 PM Discussed treatment plan with pt at bedside and pt agreed to plan.   Labs Review Labs Reviewed - No data to display  Imaging Review No results found.   EKG Interpretation None      MDM   Final diagnoses:  Skin lesion of scalp   Scalp lesions do not resemble infection, exact date, abscess. Patient with diffuse dandruff and lesions likely due to excoriations. No systemic symptoms or diffuse rash. Patient to use antihistamines for the itching and start using Selsun Blue. Follow-up with PCP for persistent symptoms.  Discussed return precautions with patient. Patient verbalizes understanding and agrees with plan.  I personally performed the services described in this documentation, which was scribed in my presence. The recorded information has been reviewed and is accurate.    Al Corpus, PA-C 09/10/14 [place], MD 09/11/14 1224

## 2014-09-10 NOTE — Discharge Instructions (Signed)
Return to the emergency room with worsening of symptoms, new symptoms or with symptoms that are concerning, especially fevers, difficulty breathing, shortness of breath, nausea vomiting, rash elsewhere on body. Start taking either Allegra, Zyrtec, or Claritin. Benadryl at night. Wash hair with selsun blue. Follow up with primary doctor for persistent symptoms. Read below information and follow recommendations. Selenium Sulfide shampoo What is this medicine? SELENIUM SULFIDE (se LEE nee um suhl fahyd) shampoo is used to treat dandruff, fungus infections on the scalp and skin and seborrhea of the scalp. This medicine may be used for other purposes; ask your health care provider or pharmacist if you have questions. COMMON BRAND NAME(S): Anti-Dandruff, Dandrex, Selenos, SelRx, Selseb, Selsun, Selsun Blue What should I tell my health care provider before I take this medicine? They need to know if you have any of these conditions: -any open or inflamed areas of the skin -an unusual or allergic reaction to selenium sulfide, other medicines, foods, dyes, or preservatives -pregnant or trying to get pregnant -breast-feeding How should I use this medicine? This medicine is for external use only. Do not take by mouth. Shake well before using. Follow the directions on the prescription label. Wash your hands before and after use. Do not get this medicine in the eyes. If you do, rinse the eyes out with plenty of cool tap water. Avoid use of this medicine in the genital area. Do not use on inflamed or broken skin. Do not use your medicine more often than directed or for a longer period of time than ordered by your doctor or health care professional. To do so may increase the chance of side effects. Talk to your pediatrician regarding the use of this medicine in children. Special care may be needed. Overdosage: If you think you have taken too much of this medicine contact a poison control center or emergency room  at once. NOTE: This medicine is only for you. Do not share this medicine with others. What if I miss a dose? If you miss a dose, use it as soon as you can. If it is almost time for your next dose, use only that dose. Do not use double or extra doses. What may interact with this medicine? Interactions are not expected. Do not use any other skin products on the affected area without telling your doctor or health care professional. This list may not describe all possible interactions. Give your health care provider a list of all the medicines, herbs, non-prescription drugs, or dietary supplements you use. Also tell them if you smoke, drink alcohol, or use illegal drugs. Some items may interact with your medicine. What should I watch for while using this medicine? Tell your doctor or health care professional if your symptoms do not improve after two weeks. If used on blond, bleached, tinted, grey or permed hair, rinse for at least 5 minutes to minimize the chance for hair discoloration. This medicine should not be used within 48 hours of applying hair color or permanent wave solutions. This medicine may damage jewelry. Remove any jewelry before use. What side effects may I notice from receiving this medicine? Side effects that you should report to your doctor or health care professional as soon as possible: -lack of healing of the skin condition -severe redness or irritation of the skin after using this medicine Side effects that usually do not require medical attention (report to your doctor or health care professional if they continue or are bothersome): -minor skin irritation This list may  not describe all possible side effects. Call your doctor for medical advice about side effects. You may report side effects to FDA at 1-800-FDA-1088. Where should I keep my medicine? Keep out of the reach of children. Store at room temperature between 15 and 30 degrees C (59 and 86 degrees F). Keep tightly  closed. Throw away any unused medicine after the expiration date. NOTE: This sheet is a summary. It may not cover all possible information. If you have questions about this medicine, talk to your doctor, pharmacist, or health care provider.  2015, Elsevier/Gold Standard. (2007-12-17 15:28:51)

## 2014-09-15 ENCOUNTER — Encounter: Payer: Self-pay | Admitting: *Deleted

## 2015-01-03 ENCOUNTER — Emergency Department (HOSPITAL_COMMUNITY): Payer: Self-pay

## 2015-01-03 ENCOUNTER — Encounter (HOSPITAL_COMMUNITY): Payer: Self-pay | Admitting: Emergency Medicine

## 2015-01-03 ENCOUNTER — Emergency Department (HOSPITAL_COMMUNITY)
Admission: EM | Admit: 2015-01-03 | Discharge: 2015-01-03 | Disposition: A | Discharge status: Home / Self Care | Payer: Self-pay | Attending: Emergency Medicine | Admitting: Emergency Medicine

## 2015-01-03 DIAGNOSIS — Z794 Long term (current) use of insulin: Secondary | ICD-10-CM | POA: Insufficient documentation

## 2015-01-03 DIAGNOSIS — E119 Type 2 diabetes mellitus without complications: Secondary | ICD-10-CM | POA: Insufficient documentation

## 2015-01-03 DIAGNOSIS — Z880 Allergy status to penicillin: Secondary | ICD-10-CM | POA: Insufficient documentation

## 2015-01-03 DIAGNOSIS — R1011 Right upper quadrant pain: Secondary | ICD-10-CM | POA: Insufficient documentation

## 2015-01-03 DIAGNOSIS — Z3202 Encounter for pregnancy test, result negative: Secondary | ICD-10-CM | POA: Insufficient documentation

## 2015-01-03 DIAGNOSIS — R110 Nausea: Secondary | ICD-10-CM | POA: Insufficient documentation

## 2015-01-03 DIAGNOSIS — Z8619 Personal history of other infectious and parasitic diseases: Secondary | ICD-10-CM | POA: Insufficient documentation

## 2015-01-03 DIAGNOSIS — R109 Unspecified abdominal pain: Secondary | ICD-10-CM

## 2015-01-03 DIAGNOSIS — R300 Dysuria: Secondary | ICD-10-CM | POA: Insufficient documentation

## 2015-01-03 LAB — COMPREHENSIVE METABOLIC PANEL
ALT: 13 U/L — ABNORMAL LOW (ref 14–54)
AST: 37 U/L (ref 15–41)
Albumin: 3.6 g/dL (ref 3.5–5.0)
Alkaline Phosphatase: 56 U/L (ref 38–126)
Anion gap: 11 (ref 5–15)
BUN: 8 mg/dL (ref 6–20)
CO2: 19 mmol/L — ABNORMAL LOW (ref 22–32)
CREATININE: 0.63 mg/dL (ref 0.44–1.00)
Calcium: 9.6 mg/dL (ref 8.9–10.3)
Chloride: 104 mmol/L (ref 101–111)
GFR calc Af Amer: >60 mL/min (ref >60)
GFR calc non Af Amer: >60 mL/min (ref >60)
Glucose, Bld: 278 mg/dL — ABNORMAL HIGH (ref 65–99)
POTASSIUM: 5 mmol/L (ref 3.5–5.1)
Sodium: 134 mmol/L — ABNORMAL LOW (ref 135–145)
TOTAL PROTEIN: 6.6 g/dL (ref 6.5–8.1)
Total Bilirubin: 0.7 mg/dL (ref 0.3–1.2)

## 2015-01-03 LAB — URINALYSIS, ROUTINE W REFLEX MICROSCOPIC
BILIRUBIN URINE: NEGATIVE
Glucose, UA: >1000 mg/dL — AB
KETONES UR: 15 mg/dL — AB
NITRITE: NEGATIVE
PROTEIN: 30 mg/dL — AB
Specific Gravity, Urine: 1.043 — ABNORMAL HIGH (ref 1.005–1.030)
Urobilinogen, UA: 1 mg/dL (ref 0.0–1.0)
pH: 6 (ref 5.0–8.0)

## 2015-01-03 LAB — LIPASE, BLOOD: Lipase: 16 U/L — ABNORMAL LOW (ref 22–51)

## 2015-01-03 LAB — CBC
HEMATOCRIT: 42.1 % (ref 36.0–46.0)
Hemoglobin: 14.2 g/dL (ref 12.0–15.0)
MCH: 28.1 pg (ref 26.0–34.0)
MCHC: 33.7 g/dL (ref 30.0–36.0)
MCV: 83.4 fL (ref 78.0–100.0)
PLATELETS: ADEQUATE 10*3/uL (ref 150–400)
RBC: 5.05 MIL/uL (ref 3.87–5.11)
RDW: 13 % (ref 11.5–15.5)
WBC: 7.7 10*3/uL (ref 4.0–10.5)

## 2015-01-03 LAB — I-STAT BETA HCG BLOOD, ED (MC, WL, AP ONLY)

## 2015-01-03 LAB — URINE MICROSCOPIC-ADD ON

## 2015-01-03 MED ORDER — MORPHINE SULFATE (PF) 2 MG/ML IV SOLN
2.0000 mg | Freq: Once | INTRAVENOUS | Status: AC
  Administered 2015-01-03: 2 mg via INTRAVENOUS
  Filled 2015-01-03: qty 1

## 2015-01-03 MED ORDER — FAMOTIDINE 20 MG PO TABS: 20.0000 mg | ORAL_TABLET | Freq: Two times a day (BID) | ORAL | Status: DC

## 2015-01-03 NOTE — ED Notes (Signed)
Pt c/o abdominal pain x 3 days along with decreased appetite. Pt felt a knot in her abdomen last night.

## 2015-01-03 NOTE — ED Notes (Signed)
Attempted x2 to start IV with no success.

## 2015-01-03 NOTE — Discharge Instructions (Signed)

## 2015-01-03 NOTE — ED Notes (Signed)
Patient transported to Ultrasound 

## 2015-01-03 NOTE — ED Provider Notes (Signed)
CSN: [id number]     Arrival date & time 01/03/15  1019 History   First MD Initiated Contact with Patient 01/03/15 1022     Chief Complaint  Patient presents with  . Abdominal Pain     (Consider location/radiation/quality/duration/timing/severity/associated sxs/prior Treatment) HPI Comments: Patient with a history diabetes mellitus presents with abdominal pain. She reports a three-day history of worsening pain in her right upper abdomen. It is nonradiating. She feels like it might be associated with eating. Initially it was intermittent but since yesterday seems to be more constant. She's had some decreased appetite and some mild nausea but no vomiting. She denies any fevers. She reports little bit of burning when she Pees. She denies any lower abdominal pain. There is no vaginal bleeding or discharge. She just recently finished her period. She denies any change in bowel habits. She denies any past history of gallbladder disease. She denies any abdominal surgeries.  Patient is a 24 y.o. female presenting with abdominal pain.  Abdominal Pain Associated symptoms: dysuria and nausea   Associated symptoms: no chest pain, no chills, no cough, no diarrhea, no fatigue, no fever, no hematuria, no shortness of breath and no vomiting     Past Medical History  Diagnosis Date  . Gonorrhea 08/2011    Treated in 09/2011  . Diabetes mellitus 2001    Diagnosed at age 89 ; Type I  . DKA (diabetic ketoacidoses) 08/19/2013   Past Surgical History  Procedure Laterality Date  . No past surgeries    . Incision and drainage perirectal abscess Right 08/18/2013    Procedure: IRRIGATION AND DEBRIDEMENT GLUTEAL ABSCESS;  Surgeon: Ralene Ok, MD;  Location: [facility];  Service: General;  Laterality: Right;  . Incision and drainage perirectal abscess Right 09/19/2013    Procedure: IRRIGATION AND DEBRIDEMENT RIGHT GLUTEAL AND LABIAL ABSCESSES;  Surgeon: Ralene Ok, MD;  Location: [facility];  Service: General;   Laterality: Right;  . Incision and drainage perirectal abscess Right 09/24/2013    Procedure: IRRIGATION AND DEBRIDEMENT PERIRECTAL ABSCESS;  Surgeon: Gwenyth Ober, MD;  Location: [facility];  Service: General;  Laterality: Right;   Family History  Problem Relation Age of Onset  . Anesthesia problems Neg Hx   . Asthma Mother   . Gout Father   . Diabetes Paternal Grandmother    Social History  Substance Use Topics  . Smoking status: Never Smoker   . Smokeless tobacco: Never Used  . Alcohol Use: Yes     Comment: occ   OB History    Gravida Para Term Preterm AB TAB SAB Ectopic Multiple Living   1 0   1  1   0     Review of Systems  Constitutional: Positive for appetite change. Negative for fever, chills, diaphoresis and fatigue.  HENT: Negative for congestion, rhinorrhea and sneezing.   Eyes: Negative.   Respiratory: Negative for cough, chest tightness and shortness of breath.   Cardiovascular: Negative for chest pain and leg swelling.  Gastrointestinal: Positive for nausea and abdominal pain. Negative for vomiting, diarrhea and blood in stool.  Genitourinary: Positive for dysuria. Negative for frequency, hematuria, flank pain and difficulty urinating.  Musculoskeletal: Negative for back pain and arthralgias.  Skin: Negative for rash.  Neurological: Negative for dizziness, speech difficulty, weakness, numbness and headaches.      Allergies  Penicillins  Home Medications   Prior to Admission medications   Medication Sig Start Date End Date Taking? Authorizing Provider  acetaminophen (TYLENOL) 325 MG  tablet Take 1 tablet (325 mg total) by mouth every 6 (six) hours as needed for mild pain or moderate pain. 07/27/14  Yes Luan Moore, MD  insulin aspart (NOVOLOG) 100 UNIT/ML injection Inject 2-10 Units into the skin 2 (two) times daily as needed for high blood sugar (CBG over 100). Per sliding scale   Yes Historical Provider, MD  Insulin Glargine (LANTUS SOLOSTAR) 100 UNIT/ML  Solostar Pen Inject 20 Units into the skin 2 (two) times daily. Breakfast and dinner   Yes Historical Provider, MD  famotidine (PEPCID) 20 MG tablet Take 1 tablet (20 mg total) by mouth 2 (two) times daily. 01/03/15   Malvin Johns, MD   BP 110/69 mmHg  Pulse 92  Temp(Src) 97.8 F (36.6 C) (Oral)  Resp 18  Ht 5\' 3"  (1.6 m)  Wt 104 lb (47.174 kg)  BMI 18.43 kg/m2  SpO2 100%  LMP 01/02/2015 Physical Exam  Constitutional: She is oriented to person, place, and time. She appears well-developed and well-nourished.  HENT:  Head: Normocephalic and atraumatic.  Eyes: Pupils are equal, round, and reactive to light.  Neck: Normal range of motion. Neck supple.  Cardiovascular: Normal rate, regular rhythm and normal heart sounds.   Pulmonary/Chest: Effort normal and breath sounds normal. No respiratory distress. She has no wheezes. She has no rales. She exhibits no tenderness.  Abdominal: Soft. Bowel sounds are normal. There is tenderness (Moderate tenderness to the right upper abdomen). There is no rebound and no guarding.  Musculoskeletal: Normal range of motion. She exhibits no edema.  Lymphadenopathy:    She has no cervical adenopathy.  Neurological: She is alert and oriented to person, place, and time.  Skin: Skin is warm and dry. No rash noted.  Psychiatric: She has a normal mood and affect.    ED Course  Procedures (including critical care time) Labs Review Labs Reviewed  LIPASE, BLOOD - Abnormal; Notable for the following:    Lipase 16 (*)    All other components within normal limits  COMPREHENSIVE METABOLIC PANEL - Abnormal; Notable for the following:    Sodium 134 (*)    CO2 19 (*)    Glucose, Bld 278 (*)    ALT 13 (*)    All other components within normal limits  URINALYSIS, ROUTINE W REFLEX MICROSCOPIC (NOT AT [facility]) - Abnormal; Notable for the following:    Color, Urine RED (*)    APPearance TURBID (*)    Specific Gravity, Urine 1.043 (*)    Glucose, UA >1000 (*)    Hgb  urine dipstick LARGE (*)    Ketones, ur 15 (*)    Protein, ur 30 (*)    Leukocytes, UA SMALL (*)    All other components within normal limits  URINE MICROSCOPIC-ADD ON - Abnormal; Notable for the following:    Squamous Epithelial / LPF MANY (*)    Bacteria, UA MANY (*)    All other components within normal limits  URINE CULTURE  CBC  I-STAT BETA HCG BLOOD, ED ([facility], [facility], [facility] ONLY)    Imaging Review US Abdomen Limited Ruq  01/03/2015   CLINICAL DATA:  Abdominal pain  EXAM: US ABDOMEN LIMITED - RIGHT UPPER QUADRANT  COMPARISON:  None.  FINDINGS: Gallbladder:  No gallstones or wall thickening visualized. No sonographic Murphy sign noted.  Common bile duct:  Diameter: 2.6 mm  Liver:  No focal lesion identified. Within normal limits in parenchymal echogenicity.  IMPRESSION: Normal right upper quadrant ultrasound.   Electronically Signed  By: Kathreen Devoid   On: 01/03/2015 12:29   I have personally reviewed and evaluated these images and lab results as part of my medical decision-making.   EKG Interpretation None      MDM   Final diagnoses:  Abdominal pain    Patient presents with right upper quadrant and epigastric pain. There is no evidence of gallbladder disease. Her labs are unremarkable. I feel this is likely a gastritis. She was given instructions to use a bland diet. She was started on Pepcid. She was encouraged to follow-up with her PCP if her symptoms continue or return here as needed she has worsening symptoms. Her urine had blood in it but she is just coming off of her menstrual cycle. It did have bacteria but also a large amount of squamous cells. I feel that it's more consistent with a dirty specimen. She doesn't have any symptoms of a UTI. I will send for culture but not treated at this point.    Malvin Johns, MD 01/03/15 [phone]

## 2015-01-05 ENCOUNTER — Emergency Department (HOSPITAL_COMMUNITY)
Admission: EM | Admit: 2015-01-05 | Discharge: 2015-01-05 | Disposition: A | Discharge status: Home / Self Care | Payer: Self-pay | Attending: Emergency Medicine | Admitting: Emergency Medicine

## 2015-01-05 ENCOUNTER — Encounter (HOSPITAL_COMMUNITY): Payer: Self-pay | Admitting: Family Medicine

## 2015-01-05 DIAGNOSIS — Z3202 Encounter for pregnancy test, result negative: Secondary | ICD-10-CM | POA: Insufficient documentation

## 2015-01-05 DIAGNOSIS — Z8619 Personal history of other infectious and parasitic diseases: Secondary | ICD-10-CM | POA: Insufficient documentation

## 2015-01-05 DIAGNOSIS — Z794 Long term (current) use of insulin: Secondary | ICD-10-CM | POA: Insufficient documentation

## 2015-01-05 DIAGNOSIS — Z880 Allergy status to penicillin: Secondary | ICD-10-CM | POA: Insufficient documentation

## 2015-01-05 DIAGNOSIS — R1011 Right upper quadrant pain: Secondary | ICD-10-CM

## 2015-01-05 DIAGNOSIS — Y998 Other external cause status: Secondary | ICD-10-CM | POA: Insufficient documentation

## 2015-01-05 DIAGNOSIS — Y9389 Activity, other specified: Secondary | ICD-10-CM | POA: Insufficient documentation

## 2015-01-05 DIAGNOSIS — S3991XA Unspecified injury of abdomen, initial encounter: Secondary | ICD-10-CM | POA: Insufficient documentation

## 2015-01-05 DIAGNOSIS — X58XXXA Exposure to other specified factors, initial encounter: Secondary | ICD-10-CM | POA: Insufficient documentation

## 2015-01-05 DIAGNOSIS — Y9289 Other specified places as the place of occurrence of the external cause: Secondary | ICD-10-CM | POA: Insufficient documentation

## 2015-01-05 DIAGNOSIS — R739 Hyperglycemia, unspecified: Secondary | ICD-10-CM

## 2015-01-05 DIAGNOSIS — E1165 Type 2 diabetes mellitus with hyperglycemia: Secondary | ICD-10-CM | POA: Insufficient documentation

## 2015-01-05 DIAGNOSIS — S39012A Strain of muscle, fascia and tendon of lower back, initial encounter: Secondary | ICD-10-CM | POA: Insufficient documentation

## 2015-01-05 LAB — URINALYSIS, ROUTINE W REFLEX MICROSCOPIC
Bilirubin Urine: NEGATIVE
Hgb urine dipstick: NEGATIVE
Ketones, ur: NEGATIVE mg/dL
LEUKOCYTES UA: NEGATIVE
NITRITE: NEGATIVE
Protein, ur: NEGATIVE mg/dL
SPECIFIC GRAVITY, URINE: 1.041 — AB (ref 1.005–1.030)
Urobilinogen, UA: 0.2 mg/dL (ref 0.0–1.0)
pH: 6 (ref 5.0–8.0)

## 2015-01-05 LAB — I-STAT BETA HCG BLOOD, ED (MC, WL, AP ONLY)

## 2015-01-05 LAB — COMPREHENSIVE METABOLIC PANEL
ALBUMIN: 3.5 g/dL (ref 3.5–5.0)
ALK PHOS: 55 U/L (ref 38–126)
ALT: 13 U/L — AB (ref 14–54)
AST: 20 U/L (ref 15–41)
Anion gap: 8 (ref 5–15)
BILIRUBIN TOTAL: 0.5 mg/dL (ref 0.3–1.2)
BUN: 7 mg/dL (ref 6–20)
CALCIUM: 9.1 mg/dL (ref 8.9–10.3)
CO2: 23 mmol/L (ref 22–32)
CREATININE: 0.66 mg/dL (ref 0.44–1.00)
Chloride: 100 mmol/L — ABNORMAL LOW (ref 101–111)
GFR calc Af Amer: >60 mL/min (ref >60)
GFR calc non Af Amer: >60 mL/min (ref >60)
GLUCOSE: 567 mg/dL — AB (ref 65–99)
Potassium: 4.2 mmol/L (ref 3.5–5.1)
Sodium: 131 mmol/L — ABNORMAL LOW (ref 135–145)
TOTAL PROTEIN: 6.9 g/dL (ref 6.5–8.1)

## 2015-01-05 LAB — CBC
HCT: 38.2 % (ref 36.0–46.0)
Hemoglobin: 12.8 g/dL (ref 12.0–15.0)
MCH: 27.5 pg (ref 26.0–34.0)
MCHC: 33.5 g/dL (ref 30.0–36.0)
MCV: 82 fL (ref 78.0–100.0)
PLATELETS: 353 10*3/uL (ref 150–400)
RBC: 4.66 MIL/uL (ref 3.87–5.11)
RDW: 12.8 % (ref 11.5–15.5)
WBC: 7.2 10*3/uL (ref 4.0–10.5)

## 2015-01-05 LAB — URINE CULTURE

## 2015-01-05 LAB — URINE MICROSCOPIC-ADD ON

## 2015-01-05 LAB — CBG MONITORING, ED: Glucose-Capillary: 253 mg/dL — ABNORMAL HIGH (ref 65–99)

## 2015-01-05 MED ORDER — CYCLOBENZAPRINE HCL 10 MG PO TABS
5.0000 mg | ORAL_TABLET | Freq: Once | ORAL | Status: AC
  Administered 2015-01-05: 5 mg via ORAL
  Filled 2015-01-05: qty 1

## 2015-01-05 MED ORDER — SODIUM CHLORIDE 0.9 % IV SOLN
INTRAVENOUS | Status: DC
  Administered 2015-01-05: 19:00:00 via INTRAVENOUS

## 2015-01-05 MED ORDER — CYCLOBENZAPRINE HCL 5 MG PO TABS: 5.0000 mg | ORAL_TABLET | Freq: Three times a day (TID) | ORAL | Status: DC | PRN

## 2015-01-05 MED ORDER — SODIUM CHLORIDE 0.9 % IV BOLUS (SEPSIS)
1000.0000 mL | Freq: Once | INTRAVENOUS | Status: AC
  Administered 2015-01-05: 1000 mL via INTRAVENOUS

## 2015-01-05 MED ORDER — FAMOTIDINE 20 MG PO TABS: 20.0000 mg | ORAL_TABLET | Freq: Two times a day (BID) | ORAL | Status: DC

## 2015-01-05 MED ORDER — FAMOTIDINE IN NACL 20-0.9 MG/50ML-% IV SOLN
20.0000 mg | Freq: Once | INTRAVENOUS | Status: AC
  Administered 2015-01-05: 20 mg via INTRAVENOUS
  Filled 2015-01-05: qty 50

## 2015-01-05 NOTE — ED Notes (Signed)
Pt states its past time when shes supposed to take her insulin, states she drank a soda right before she came here and is planning on going home and taking her insulin when she gets home.

## 2015-01-05 NOTE — ED Notes (Signed)
Per pt she is having right side pain and back pain. Denies any N,V,D,

## 2015-01-05 NOTE — ED Provider Notes (Signed)
CSN: [id number]     Arrival date & time 01/05/15  1655 History  This chart was scribed for Ashley Baller, NP working with Quintella Reichert, MD by Randa Evens, ED Scribe. This patient was seen in room TR03C/TR03C and the patient's care was started at 5:25 PM.    Chief Complaint  Patient presents with  . Flank Pain  . Back Pain   Patient is a 24 y.o. female presenting with flank pain. The history is provided by the patient. No language interpreter was used.  Flank Pain This is a recurrent problem. The current episode started more than 2 days ago. The problem occurs rarely. The problem has been gradually worsening. Associated symptoms include abdominal pain. Exacerbated by: movement  Nothing relieves the symptoms. She has tried nothing for the symptoms.   HPI Comments: Ashley Freeman is a 24 y.o. female who presents to the Emergency Department complaining of right flank pain onset 6 days prior. Pt rates the severity of her pain 7.5/10. Pt states that the pain begin in her right abdomen and has now radiated around to her back. Pt reports decreased appetite due to pain. She states that she was in the ED 2 days prior for similar pain. She states that she received a prescription for Pepcid but didn't not take the medication. Pt reports having a RUQ ultrasound that showed no abnormalities. She states that movement makes the pain worse. Denies nausea, vomiting, fever, chills or dysuria.   Past Medical History  Diagnosis Date  . Gonorrhea 08/2011    Treated in 09/2011  . Diabetes mellitus 2001    Diagnosed at age 57 ; Type I  . DKA (diabetic ketoacidoses) 08/19/2013   Past Surgical History  Procedure Laterality Date  . No past surgeries    . Incision and drainage perirectal abscess Right 08/18/2013    Procedure: IRRIGATION AND DEBRIDEMENT GLUTEAL ABSCESS;  Surgeon: Ralene Ok, MD;  Location: [place];  Service: General;  Laterality: Right;  . Incision and drainage perirectal abscess Right  09/19/2013    Procedure: IRRIGATION AND DEBRIDEMENT RIGHT GLUTEAL AND LABIAL ABSCESSES;  Surgeon: Ralene Ok, MD;  Location: [place];  Service: General;  Laterality: Right;  . Incision and drainage perirectal abscess Right 09/24/2013    Procedure: IRRIGATION AND DEBRIDEMENT PERIRECTAL ABSCESS;  Surgeon: Gwenyth Ober, MD;  Location: [place];  Service: General;  Laterality: Right;   Family History  Problem Relation Age of Onset  . Anesthesia problems Neg Hx   . Asthma Mother   . Gout Father   . Diabetes Paternal Grandmother    Social History  Substance Use Topics  . Smoking status: Never Smoker   . Smokeless tobacco: Never Used  . Alcohol Use: Yes     Comment: occ   OB History    Gravida Para Term Preterm AB TAB SAB Ectopic Multiple Living   1 0   1  1   0     Review of Systems  Constitutional: Negative for fever and chills.  Gastrointestinal: Positive for abdominal pain. Negative for nausea and vomiting.  Genitourinary: Positive for flank pain. Negative for dysuria.  Musculoskeletal: Positive for back pain.  All other systems reviewed and are negative.     Allergies  Penicillins  Home Medications   Prior to Admission medications   Medication Sig Start Date End Date Taking? Authorizing Provider  acetaminophen (TYLENOL) 325 MG tablet Take 1 tablet (325 mg total) by mouth every 6 (six) hours as needed for  mild pain or moderate pain. 07/27/14   Luan Moore, MD  cyclobenzaprine (FLEXERIL) 5 MG tablet Take 1 tablet (5 mg total) by mouth 3 (three) times daily as needed for muscle spasms. 01/05/15   Ezariah Nace Bunnie Pion, NP  famotidine (PEPCID) 20 MG tablet Take 1 tablet (20 mg total) by mouth 2 (two) times daily. 01/05/15   Joaopedro Eschbach Bunnie Pion, NP  insulin aspart (NOVOLOG) 100 UNIT/ML injection Inject 2-10 Units into the skin 2 (two) times daily as needed for high blood sugar (CBG over 100). Per sliding scale    Historical Provider, MD  Insulin Glargine (LANTUS SOLOSTAR) 100 UNIT/ML Solostar Pen  Inject 20 Units into the skin 2 (two) times daily. Breakfast and dinner    Historical Provider, MD   BP 132/82 mmHg  Pulse 84  Temp(Src) 98.1 F (36.7 C) (Oral)  Resp 20  Ht 5\' 3"  (1.6 m)  Wt 110 lb (49.896 kg)  BMI 19.49 kg/m2  SpO2 100%  LMP 01/02/2015   Physical Exam  Constitutional: She is oriented to person, place, and time. She appears well-developed and well-nourished. No distress.  HENT:  Head: Normocephalic and atraumatic.  Eyes: Conjunctivae and EOM are normal.  Neck: Neck supple. No tracheal deviation present.  Cardiovascular: Normal rate, regular rhythm and normal heart sounds.   Pulmonary/Chest: Effort normal. No respiratory distress.  Abdominal: Soft. Bowel sounds are normal. There is tenderness. There is no rebound and no guarding.  RUQ tenderness with palpation.    Musculoskeletal: Normal range of motion.  Right lumbar tenderness with palpation and ROM.  Neurological: She is alert and oriented to person, place, and time.  Skin: Skin is warm and dry.  Psychiatric: She has a normal mood and affect. Her behavior is normal.  Nursing note and vitals reviewed.   ED Course  Procedures (including critical care time) DIAGNOSTIC STUDIES: Oxygen Saturation is 99% on RA, normal by my interpretation.    COORDINATION OF CARE: 5:39 PM-Discussed treatment plan with pt at bedside and pt agreed to plan.  6:37 PM- Pt states that it is passed due for her insulin. Pt glucose level is 567 but she states she just had a soda PTA and plans to give herself her insulin injection as soon as she gets home.  Discussed with the patient that we will give NSS IV and her insulin dose. Will also give Pepcid 20 mg IV and Flexeril 5 mg PO.  Patient reports feeling much better after mediations. CBG after fluids down to 253  Labs Review Results for orders placed or performed during the hospital encounter of 01/05/15 (from the past 24 hour(s))  Comprehensive metabolic panel     Status: Abnormal    Collection Time: 01/05/15  5:19 PM  Result Value Ref Range   Sodium 131 (L) 135 - 145 mmol/L   Potassium 4.2 3.5 - 5.1 mmol/L   Chloride 100 (L) 101 - 111 mmol/L   CO2 23 22 - 32 mmol/L   Glucose, Bld 567 (HH) 65 - 99 mg/dL   BUN 7 6 - 20 mg/dL   Creatinine, Ser 0.66 0.44 - 1.00 mg/dL   Calcium 9.1 8.9 - 10.3 mg/dL   Total Protein 6.9 6.5 - 8.1 g/dL   Albumin 3.5 3.5 - 5.0 g/dL   AST 20 15 - 41 U/L   ALT 13 (L) 14 - 54 U/L   Alkaline Phosphatase 55 38 - 126 U/L   Total Bilirubin 0.5 0.3 - 1.2 mg/dL   GFR calc non  Af Amer >60 >60 mL/min   GFR calc Af Amer >60 >60 mL/min   Anion gap 8 5 - 15  CBC     Status: None   Collection Time: 01/05/15  5:19 PM  Result Value Ref Range   WBC 7.2 4.0 - 10.5 K/uL   RBC 4.66 3.87 - 5.11 MIL/uL   Hemoglobin 12.8 12.0 - 15.0 g/dL   HCT 38.2 36.0 - 46.0 %   MCV 82.0 78.0 - 100.0 fL   MCH 27.5 26.0 - 34.0 pg   MCHC 33.5 30.0 - 36.0 g/dL   RDW 12.8 11.5 - 15.5 %   Platelets 353 150 - 400 K/uL  I-Stat beta hCG blood, ED ([facility], [facility], [facility] only)     Status: None   Collection Time: 01/05/15  5:31 PM  Result Value Ref Range   I-stat hCG, quantitative <5.0 <5 mIU/mL   Comment 3          Urinalysis, Routine w reflex microscopic (not at [facility])     Status: Abnormal   Collection Time: 01/05/15  5:48 PM  Result Value Ref Range   Color, Urine YELLOW YELLOW   APPearance CLEAR CLEAR   Specific Gravity, Urine 1.041 (H) 1.005 - 1.030   pH 6.0 5.0 - 8.0   Glucose, UA >1000 (A) NEGATIVE mg/dL   Hgb urine dipstick NEGATIVE NEGATIVE   Bilirubin Urine NEGATIVE NEGATIVE   Ketones, ur NEGATIVE NEGATIVE mg/dL   Protein, ur NEGATIVE NEGATIVE mg/dL   Urobilinogen, UA 0.2 0.0 - 1.0 mg/dL   Nitrite NEGATIVE NEGATIVE   Leukocytes, UA NEGATIVE NEGATIVE  Urine microscopic-add on     Status: None   Collection Time: 01/05/15  5:48 PM  Result Value Ref Range   Squamous Epithelial / LPF RARE RARE   WBC, UA 0-2 <3 WBC/hpf   Bacteria, UA RARE RARE  CBG monitoring, ED      Status: Abnormal   Collection Time: 01/05/15  8:19 PM  Result Value Ref Range   Glucose-Capillary 253 (H) 65 - 99 mg/dL     MDM  24 y.o. female with continued back and right side pain since ED visit 2 days ago. She has not filled her Rx that was given at that visit. Will give Flexeril for muscle spasm. Stable for d/c with improved symptoms after treatment. Patient agrees t fill her Rx and follow up with her doctor.   Final diagnoses:  Hyperglycemia  Lumbar strain, initial encounter  Right upper quadrant abdominal pain   I personally performed the services described in this documentation, which was scribed in my presence. The recorded information has been reviewed and is accurate.      [place], NP 01/05/15 [place], MD 01/06/15 [phone]

## 2015-01-05 NOTE — ED Notes (Signed)
Critical glucose report read to  [facility].

## 2015-01-05 NOTE — ED Notes (Signed)
Per patient, if her CBG is that high, shes supopsed to take 10units of novolog and 20 units of lantus.

## 2015-01-05 NOTE — Discharge Instructions (Signed)
Take the medication as directed. Follow up with your doctor. Check your blood sugar regularly.

## 2015-01-14 ENCOUNTER — Encounter (HOSPITAL_COMMUNITY): Payer: Self-pay | Admitting: *Deleted

## 2015-01-14 ENCOUNTER — Emergency Department (HOSPITAL_COMMUNITY)
Admission: EM | Admit: 2015-01-14 | Discharge: 2015-01-15 | Disposition: A | Discharge status: Home / Self Care | Payer: Medicaid Other | Attending: Emergency Medicine | Admitting: Emergency Medicine

## 2015-01-14 DIAGNOSIS — Z3202 Encounter for pregnancy test, result negative: Secondary | ICD-10-CM | POA: Insufficient documentation

## 2015-01-14 DIAGNOSIS — Z8619 Personal history of other infectious and parasitic diseases: Secondary | ICD-10-CM | POA: Insufficient documentation

## 2015-01-14 DIAGNOSIS — L981 Factitial dermatitis: Secondary | ICD-10-CM | POA: Insufficient documentation

## 2015-01-14 DIAGNOSIS — E1310 Other specified diabetes mellitus with ketoacidosis without coma: Secondary | ICD-10-CM | POA: Insufficient documentation

## 2015-01-14 DIAGNOSIS — R110 Nausea: Secondary | ICD-10-CM

## 2015-01-14 DIAGNOSIS — Z880 Allergy status to penicillin: Secondary | ICD-10-CM | POA: Insufficient documentation

## 2015-01-14 DIAGNOSIS — T07XXXA Unspecified multiple injuries, initial encounter: Secondary | ICD-10-CM

## 2015-01-14 DIAGNOSIS — Z794 Long term (current) use of insulin: Secondary | ICD-10-CM | POA: Insufficient documentation

## 2015-01-14 NOTE — ED Notes (Signed)
The pt was bitten by something 7 days ago and she thins she was bitten by a spider.  Lesion rt thigh.  lmp  Last month

## 2015-01-15 LAB — POC URINE PREG, ED: Preg Test, Ur: NEGATIVE

## 2015-01-15 MED ORDER — BACITRACIN ZINC 500 UNIT/GM EX OINT
1.0000 "application " | TOPICAL_OINTMENT | Freq: Two times a day (BID) | CUTANEOUS | Status: DC
  Administered 2015-01-15: 1 via TOPICAL

## 2015-01-15 MED ORDER — BACITRACIN ZINC 500 UNIT/GM EX OINT: 1.0000 "application " | TOPICAL_OINTMENT | Freq: Two times a day (BID) | CUTANEOUS | Status: DC

## 2015-01-15 NOTE — ED Provider Notes (Signed)
CSN: [id number]     Arrival date & time 01/14/15  2336 History   First MD Initiated Contact with Patient 01/14/15 2351     Chief Complaint  Patient presents with  . Insect Bite     (Consider location/radiation/quality/duration/timing/severity/associated sxs/prior Treatment) The history is provided by the patient and medical records. No language interpreter was used.    Ashley Freeman is a 24 y.o. female  with a hx of insulin-dependent diabetes presents to the Emergency Department complaining of lesion to the left inner thigh.  Patient reports she believes she was bitten by something approximately 7 days ago and has been scratching it since then. Patient reports her mother told her it might be a spider and she became afraid this came to the emergency room to have it checked. She denies fever, chills, vomiting, diarrhea.  No treatment prior to arrival. No aggravating or alleviating factors.  Patient reports normal blood sugars at home.  Patient also requests a pregnancy test. She reports she has one sexual partner and they regularly use condoms how for approximately one week ago the condom broke. Last menstrual cycle was 11/15/2014.  Patient denies vaginal bleeding, abdominal pain, vomiting.  She does endorse several days of nausea.   Past Medical History  Diagnosis Date  . Gonorrhea 08/2011    Treated in 09/2011  . Diabetes mellitus 2001    Diagnosed at age 57 ; Type I  . DKA (diabetic ketoacidoses) 08/19/2013   Past Surgical History  Procedure Laterality Date  . No past surgeries    . Incision and drainage perirectal abscess Right 08/18/2013    Procedure: IRRIGATION AND DEBRIDEMENT GLUTEAL ABSCESS;  Surgeon: Ralene Ok, MD;  Location: [place];  Service: General;  Laterality: Right;  . Incision and drainage perirectal abscess Right 09/19/2013    Procedure: IRRIGATION AND DEBRIDEMENT RIGHT GLUTEAL AND LABIAL ABSCESSES;  Surgeon: Ralene Ok, MD;  Location: [place];  Service:  General;  Laterality: Right;  . Incision and drainage perirectal abscess Right 09/24/2013    Procedure: IRRIGATION AND DEBRIDEMENT PERIRECTAL ABSCESS;  Surgeon: Gwenyth Ober, MD;  Location: [place];  Service: General;  Laterality: Right;   Family History  Problem Relation Age of Onset  . Anesthesia problems Neg Hx   . Asthma Mother   . Gout Father   . Diabetes Paternal Grandmother    Social History  Substance Use Topics  . Smoking status: Never Smoker   . Smokeless tobacco: Never Used  . Alcohol Use: Yes     Comment: occ   OB History    Gravida Para Term Preterm AB TAB SAB Ectopic Multiple Living   1 0   1  1   0     Review of Systems  Constitutional: Negative for fever and chills.  Gastrointestinal: Positive for nausea. Negative for vomiting.  Endocrine: Negative for polydipsia, polyphagia and polyuria.  Skin: Positive for wound.  Allergic/Immunologic: Negative for immunocompromised state.  Hematological: Does not bruise/bleed easily.  Psychiatric/Behavioral: The patient is not nervous/anxious.       Allergies  Penicillins  Home Medications   Prior to Admission medications   Medication Sig Start Date End Date Taking? Authorizing Provider  acetaminophen (TYLENOL) 325 MG tablet Take 1 tablet (325 mg total) by mouth every 6 (six) hours as needed for mild pain or moderate pain. 07/27/14   Luan Moore, MD  bacitracin ointment Apply 1 application topically 2 (two) times daily. 01/15/15   Sonam Wandel, PA-C  cyclobenzaprine (FLEXERIL)  5 MG tablet Take 1 tablet (5 mg total) by mouth 3 (three) times daily as needed for muscle spasms. 01/05/15   Hope Bunnie Pion, NP  famotidine (PEPCID) 20 MG tablet Take 1 tablet (20 mg total) by mouth 2 (two) times daily. 01/05/15   Hope Bunnie Pion, NP  insulin aspart (NOVOLOG) 100 UNIT/ML injection Inject 2-10 Units into the skin 2 (two) times daily as needed for high blood sugar (CBG over 100). Per sliding scale    Historical Provider, MD  Insulin  Glargine (LANTUS SOLOSTAR) 100 UNIT/ML Solostar Pen Inject 20 Units into the skin 2 (two) times daily. Breakfast and dinner    Historical Provider, MD   BP 118/90 mmHg  Pulse 113  Temp(Src) 98.1 F (36.7 C)  Resp 16  Ht 5\' 3"  (1.6 m)  Wt 110 lb 6 oz (50.066 kg)  BMI 19.56 kg/m2  SpO2 100%  LMP 01/02/2015 Physical Exam  Constitutional: She is oriented to person, place, and time. She appears well-developed and well-nourished. No distress.  HENT:  Head: Normocephalic and atraumatic.  Eyes: Conjunctivae are normal. No scleral icterus.  Neck: Normal range of motion.  Cardiovascular: Normal rate, regular rhythm, normal heart sounds and intact distal pulses.   No murmur heard. Pulmonary/Chest: Effort normal and breath sounds normal.  Abdominal: Soft. She exhibits no distension. There is no tenderness.  Lymphadenopathy:    She has no cervical adenopathy.  Neurological: She is alert and oriented to person, place, and time.  Skin: Skin is warm and dry. She is not diaphoretic. No erythema.  Small excoriated lesion to the left inner thigh without erythema, induration, increased warmth or purulent drainage  Psychiatric: She has a normal mood and affect.  Nursing note and vitals reviewed.   ED Course  Procedures (including critical care time) Labs Review Labs Reviewed  POC URINE PREG, ED    Imaging Review No results found. I have personally reviewed and evaluated these images and lab results as part of my medical decision-making.   EKG Interpretation None      MDM   Final diagnoses:  Multiple excoriations  Nausea  Negative pregnancy test   Ashley Freeman presents with "bug bite" to the left inner thigh.  Excoriations noted without evidence of infection.  Pt also wants a pregnancy test.  Patient reports normal blood sugars at home.  1:15 AM Pregnancy test negative.  Pt with be d/c with wound care and bacitracin.    BP 118/90 mmHg  Pulse 113  Temp(Src) 98.1 F (36.7  C)  Resp 16  Ht 5\' 3"  (1.6 m)  Wt 110 lb 6 oz (50.066 kg)  BMI 19.56 kg/m2  SpO2 100%  LMP 01/02/2015     Jarrett Soho Lucius Wise, PA-C 01/15/15 0121  Elnora Morrison, MD 01/20/15 2230

## 2015-01-15 NOTE — Discharge Instructions (Signed)
1. Medications: usual home medications, bacitracin 2. Treatment: rest, drink plenty of fluids,  3. Follow Up: Please followup with your primary doctor in 2-3 days for wound check and discussion of your diagnoses and further evaluation after today's visit; if you do not have a primary care doctor use the resource guide provided to find one; Please return to the ER for worsening symptoms    Wound Care Wound care helps prevent pain and infection.  You may need a tetanus shot if:  You cannot remember when you had your last tetanus shot.  You have never had a tetanus shot.  The injury broke your skin. If you need a tetanus shot and you choose not to have one, you may get tetanus. Sickness from tetanus can be serious. HOME CARE   Only take medicine as told by your doctor.  Clean the wound daily with mild soap and water.  Change any bandages (dressings) as told by your doctor.  Put medicated cream and a bandage on the wound as told by your doctor.  Change the bandage if it gets wet, dirty, or starts to smell.  Take showers. Do not take baths, swim, or do anything that puts your wound under water.  Rest and raise (elevate) the wound until the pain and puffiness (swelling) are better.  Keep all doctor visits as told. GET HELP RIGHT AWAY IF:   Yellowish-white fluid (pus) comes from the wound.  Medicine does not lessen your pain.  There is a red streak going away from the wound.  You have a fever. MAKE SURE YOU:   Understand these instructions.  Will watch your condition.  Will get help right away if you are not doing well or get worse. Document Released: 01/24/2008 Document Revised: 07/09/2011 Document Reviewed: 08/20/2010 [facility] Patient Information 2015 [place], [place]. This information is not intended to replace advice given to you by your health care provider. Make sure you discuss any questions you have with your health care provider.

## 2015-01-31 ENCOUNTER — Ambulatory Visit: Payer: Self-pay | Admitting: Internal Medicine

## 2015-02-01 ENCOUNTER — Encounter: Payer: Self-pay | Admitting: Internal Medicine

## 2015-03-12 ENCOUNTER — Encounter (HOSPITAL_COMMUNITY): Payer: Self-pay

## 2015-03-12 ENCOUNTER — Emergency Department (HOSPITAL_COMMUNITY)
Admission: EM | Admit: 2015-03-12 | Discharge: 2015-03-13 | Disposition: A | Discharge status: Home / Self Care | Payer: Medicaid Other | Attending: Emergency Medicine | Admitting: Emergency Medicine

## 2015-03-12 DIAGNOSIS — E1010 Type 1 diabetes mellitus with ketoacidosis without coma: Secondary | ICD-10-CM

## 2015-03-12 DIAGNOSIS — Z3202 Encounter for pregnancy test, result negative: Secondary | ICD-10-CM | POA: Insufficient documentation

## 2015-03-12 DIAGNOSIS — B379 Candidiasis, unspecified: Secondary | ICD-10-CM

## 2015-03-12 DIAGNOSIS — L02214 Cutaneous abscess of groin: Secondary | ICD-10-CM | POA: Insufficient documentation

## 2015-03-12 DIAGNOSIS — Z8619 Personal history of other infectious and parasitic diseases: Secondary | ICD-10-CM | POA: Insufficient documentation

## 2015-03-12 DIAGNOSIS — Z880 Allergy status to penicillin: Secondary | ICD-10-CM | POA: Insufficient documentation

## 2015-03-12 DIAGNOSIS — Z794 Long term (current) use of insulin: Secondary | ICD-10-CM | POA: Insufficient documentation

## 2015-03-12 DIAGNOSIS — L0291 Cutaneous abscess, unspecified: Secondary | ICD-10-CM

## 2015-03-12 LAB — URINE MICROSCOPIC-ADD ON

## 2015-03-12 LAB — BASIC METABOLIC PANEL
Anion gap: 9 (ref 5–15)
BUN: 8 mg/dL (ref 6–20)
CHLORIDE: 92 mmol/L — AB (ref 101–111)
CO2: 25 mmol/L (ref 22–32)
Calcium: 8.8 mg/dL — ABNORMAL LOW (ref 8.9–10.3)
Creatinine, Ser: 0.83 mg/dL (ref 0.44–1.00)
GFR calc Af Amer: >60 mL/min (ref >60)
GFR calc non Af Amer: >60 mL/min (ref >60)
Glucose, Bld: 822 mg/dL (ref 65–99)
POTASSIUM: 3.8 mmol/L (ref 3.5–5.1)
SODIUM: 126 mmol/L — AB (ref 135–145)

## 2015-03-12 LAB — CBC
HCT: 37.4 % (ref 36.0–46.0)
Hemoglobin: 12.6 g/dL (ref 12.0–15.0)
MCH: 27.9 pg (ref 26.0–34.0)
MCHC: 33.7 g/dL (ref 30.0–36.0)
MCV: 82.9 fL (ref 78.0–100.0)
Platelets: 315 10*3/uL (ref 150–400)
RBC: 4.51 MIL/uL (ref 3.87–5.11)
RDW: 12.6 % (ref 11.5–15.5)
WBC: 7.5 10*3/uL (ref 4.0–10.5)

## 2015-03-12 LAB — CBG MONITORING, ED
Glucose-Capillary: 330 mg/dL — ABNORMAL HIGH (ref 65–99)
Glucose-Capillary: 352 mg/dL — ABNORMAL HIGH (ref 65–99)
Glucose-Capillary: >600 mg/dL (ref 65–99)

## 2015-03-12 LAB — URINALYSIS, ROUTINE W REFLEX MICROSCOPIC
Bilirubin Urine: NEGATIVE
HGB URINE DIPSTICK: NEGATIVE
Ketones, ur: 15 mg/dL — AB
Leukocytes, UA: NEGATIVE
NITRITE: NEGATIVE
PH: 6 (ref 5.0–8.0)
Protein, ur: NEGATIVE mg/dL
SPECIFIC GRAVITY, URINE: 1.038 — AB (ref 1.005–1.030)
Urobilinogen, UA: 1 mg/dL (ref 0.0–1.0)

## 2015-03-12 LAB — POC URINE PREG, ED: PREG TEST UR: NEGATIVE

## 2015-03-12 MED ORDER — INSULIN ASPART 100 UNIT/ML ~~LOC~~ SOLN
10.0000 [IU] | Freq: Once | SUBCUTANEOUS | Status: AC
  Administered 2015-03-12: 10 [IU] via INTRAVENOUS
  Filled 2015-03-12: qty 1

## 2015-03-12 MED ORDER — SODIUM CHLORIDE 0.9 % IV BOLUS (SEPSIS)
1000.0000 mL | Freq: Once | INTRAVENOUS | Status: AC
  Administered 2015-03-12: 1000 mL via INTRAVENOUS

## 2015-03-12 MED ORDER — ONDANSETRON 4 MG PO TBDP
4.0000 mg | ORAL_TABLET | Freq: Once | ORAL | Status: AC
  Administered 2015-03-12: 4 mg via ORAL
  Filled 2015-03-12: qty 1

## 2015-03-12 MED ORDER — OXYCODONE-ACETAMINOPHEN 5-325 MG PO TABS
1.0000 | ORAL_TABLET | Freq: Once | ORAL | Status: AC
  Administered 2015-03-12: 1 via ORAL
  Filled 2015-03-12: qty 1

## 2015-03-12 MED ORDER — FLUCONAZOLE 100 MG PO TABS
200.0000 mg | ORAL_TABLET | Freq: Once | ORAL | Status: AC
  Administered 2015-03-12: 200 mg via ORAL
  Filled 2015-03-12: qty 2

## 2015-03-12 MED ORDER — LIDOCAINE-EPINEPHRINE (PF) 2 %-1:200000 IJ SOLN
10.0000 mL | Freq: Once | INTRAMUSCULAR | Status: AC
  Administered 2015-03-12: 10 mL
  Filled 2015-03-12: qty 20

## 2015-03-12 MED ORDER — SULFAMETHOXAZOLE-TRIMETHOPRIM 800-160 MG PO TABS: 1.0000 | ORAL_TABLET | Freq: Two times a day (BID) | ORAL | Status: AC

## 2015-03-12 MED ORDER — FLUCONAZOLE 200 MG PO TABS: 200.0000 mg | ORAL_TABLET | Freq: Every day | ORAL | Status: AC

## 2015-03-12 NOTE — ED Provider Notes (Signed)
CSN: Ashley Freeman     Arrival date & time 03/12/15  2024 History   None    Chief Complaint  Patient presents with  . Abscess  . Hyperglycemia     (Consider location/radiation/quality/duration/timing/severity/associated sxs/prior Treatment) HPI   PCP: Jule Ser, DO PMH: gonorrhea, diabetes (insulin dependent and uncontrolled/poor compliance), hx of DKA,  Ashley Freeman is a 24 y.o.  female  CHIEF COMPLAINT: abscess to right inguinal region  Pt reports having surgery in the past for abscesses and being told not to shave her inguinal regions. She did 7 days ago and a few days after developed abscess to her right inguinal region. It decreased in size and then today started to get bigger and more painful again.  In triage she was noted to have a glucose of greater than 600. She reports just "taking her insulins but not checking her glucose hardly ever". She also reports drinking alcohol last night and not taking any of her medications today. She denies having any symptoms of  diaphoresis, fever, headache, weakness (general or focal), confusion, change of vision,  dysphagia, aphagia, shortness of breath,  abdominal pains, nausea, vomiting, diarrhea, lower extremity swelling, rash, neck pain, chest pain   Past Medical History  Diagnosis Date  . Gonorrhea 08/2011    Treated in 09/2011  . Diabetes mellitus 2001    Diagnosed at age 65 ; Type I  . DKA (diabetic ketoacidoses) ([place]) 08/19/2013   Past Surgical History  Procedure Laterality Date  . No past surgeries    . Incision and drainage perirectal abscess Right 08/18/2013    Procedure: IRRIGATION AND DEBRIDEMENT GLUTEAL ABSCESS;  Surgeon: Ralene Ok, MD;  Location: [place];  Service: General;  Laterality: Right;  . Incision and drainage perirectal abscess Right 09/19/2013    Procedure: IRRIGATION AND DEBRIDEMENT RIGHT GLUTEAL AND LABIAL ABSCESSES;  Surgeon: Ralene Ok, MD;  Location: [facility];  Service: General;  Laterality:  Right;  . Incision and drainage perirectal abscess Right 09/24/2013    Procedure: IRRIGATION AND DEBRIDEMENT PERIRECTAL ABSCESS;  Surgeon: Gwenyth Ober, MD;  Location: [facility];  Service: General;  Laterality: Right;   Family History  Problem Relation Age of Onset  . Anesthesia problems Neg Hx   . Asthma Mother   . Gout Father   . Diabetes Paternal Grandmother    Social History  Substance Use Topics  . Smoking status: Never Smoker   . Smokeless tobacco: Never Used  . Alcohol Use: Yes     Comment: occ   OB History    Gravida Para Term Preterm AB TAB SAB Ectopic Multiple Living   1 0   1  1   0     Review of Systems  ROS: See HPI Constitutional: no fever  Eyes: no drainage  ENT: no runny nose  Cardiovascular: no chest pain  Resp: no SOB  GI: no vomiting GU: no dysuria Integumentary: no rash  Allergy: no hives  Musculoskeletal: no leg swelling  Neurological: no slurred speech ROS otherwise negative  Allergies  Penicillins  Home Medications   Prior to Admission medications   Medication Sig Start Date End Date Taking? Authorizing Provider  insulin aspart (NOVOLOG) 100 UNIT/ML injection Inject 5-10 Units into the skin 2 (two) times daily. Per sliding scale   Yes Historical Provider, MD  insulin glargine (LANTUS) 100 UNIT/ML injection Inject 20 Units into the skin 2 (two) times daily with a meal.   Yes Historical Provider, MD  acetaminophen (TYLENOL) 325  MG tablet Take 1 tablet (325 mg total) by mouth every 6 (six) hours as needed for mild pain or moderate pain. Patient not taking: Reported on 03/12/2015 07/27/14   Luan Moore, MD  bacitracin ointment Apply 1 application topically 2 (two) times daily. Patient not taking: Reported on 03/12/2015 01/15/15   Jarrett Soho Muthersbaugh, PA-C  cyclobenzaprine (FLEXERIL) 5 MG tablet Take 1 tablet (5 mg total) by mouth 3 (three) times daily as needed for muscle spasms. Patient not taking: Reported on 03/12/2015 01/05/15   Ashley Murrain, NP  famotidine (PEPCID) 20 MG tablet Take 1 tablet (20 mg total) by mouth 2 (two) times daily. Patient not taking: Reported on 03/12/2015 01/05/15   Ashley Murrain, NP   BP 99/82 mmHg  Pulse 107  Temp(Src) 98 F (36.7 C) (Oral)  Resp 16  Ht 5\' 3"  (1.6 m)  Wt 110 lb (49.896 kg)  BMI 19.49 kg/m2  SpO2 99% Physical Exam  Constitutional: She appears well-developed and well-nourished. No distress.  HENT:  Head: Normocephalic and atraumatic.  Eyes: Pupils are equal, round, and reactive to light.  Neck: Normal range of motion. Neck supple.  Cardiovascular: Normal rate and regular rhythm.   Pulmonary/Chest: Effort normal.  Abdominal: Soft.  Genitourinary:  Small 1 x 4 cm area of fluctuance, tender to touch to right inguinal canal. Does not involve the labia, gluteal fold or rectum.   Neurological: She is alert.  Skin: Skin is warm and dry.  Nursing note and vitals reviewed.   ED Course  Procedures (including critical care time) Labs Review Labs Reviewed  BASIC METABOLIC PANEL - Abnormal; Notable for the following:    Sodium 126 (*)    Chloride 92 (*)    Glucose, Bld 822 (*)    Calcium 8.8 (*)    All other components within normal limits  URINALYSIS, ROUTINE W REFLEX MICROSCOPIC (NOT AT [facility]) - Abnormal; Notable for the following:    Color, Urine STRAW (*)    Specific Gravity, Urine 1.038 (*)    Glucose, UA >1000 (*)    Ketones, ur 15 (*)    All other components within normal limits  URINE MICROSCOPIC-ADD ON - Abnormal; Notable for the following:    Squamous Epithelial / LPF MANY (*)    Bacteria, UA FEW (*)    All other components within normal limits  CBG MONITORING, ED - Abnormal; Notable for the following:    Glucose-Capillary >600 (*)    All other components within normal limits  CBG MONITORING, ED - Abnormal; Notable for the following:    Glucose-Capillary 330 (*)    All other components within normal limits  CBC  POC URINE PREG, ED    Imaging Review No  results found. I have personally reviewed and evaluated these images and lab results as part of my medical decision-making.   EKG Interpretation None      MDM   Final diagnoses:  Type 1 diabetes mellitus with ketoacidosis without coma (HCC)  Abscess  Yeast infection    2L NS and 10 units insulin given.  Labs back and pt currently does not have an anion gap but glucose is at 822, with normal potassium and CO2.    INCISION AND DRAINAGE Performed by: Linus Mako Consent: Verbal consent obtained. Risks and benefits: risks, benefits and alternatives were discussed Type: abscess  Body area: right inguinal region  Anesthesia: local infiltration  Incision was made with a scalpel.  Local anesthetic: lidocaine 2% with epinephrine  Anesthetic total: 2 ml  Complexity: complex Blunt dissection to break up loculations  Drainage: purulent  Drainage amount: large  Packing material: None  Patient tolerance: Patient tolerated the procedure well with no immediate complications.   10: 58 pm - glucose is 3:30 after 1.5 liters and 10 units regular insulin. Will monitor closely for signs of hypoglycemia. Recheck glucose in 30 minutes.  + yeast in urine, given 200 mg of Oral Diflucan, will also need abx for abscess with mild cellulitis.   At end of shift, patient sign out to Charlann Lange, PA-C    Delos Haring, PA-C 03/12/15 [place], MD 03/12/15 [id number]

## 2015-03-12 NOTE — ED Notes (Signed)
PA at bedside.

## 2015-03-12 NOTE — ED Notes (Signed)
Pt here for right inner groin abscess, onset yesterday and states it is getting bigger and nothing she has done has helped decrease it.

## 2015-03-12 NOTE — Discharge Instructions (Signed)
Abscess An abscess is an infected area that contains a collection of pus and debris.It can occur in almost any part of the body. An abscess is also known as a furuncle or boil. CAUSES  An abscess occurs when tissue gets infected. This can occur from blockage of oil or sweat glands, infection of hair follicles, or a minor injury to the skin. As the body tries to fight the infection, pus collects in the area and creates pressure under the skin. This pressure causes pain. People with weakened immune systems have difficulty fighting infections and get certain abscesses more often.  SYMPTOMS Usually an abscess develops on the skin and becomes a painful mass that is red, warm, and tender. If the abscess forms under the skin, you may feel a moveable soft area under the skin. Some abscesses break open (rupture) on their own, but most will continue to get worse without care. The infection can spread deeper into the body and eventually into the bloodstream, causing you to feel ill.  DIAGNOSIS  Your caregiver will take your medical history and perform a physical exam. A sample of fluid may also be taken from the abscess to determine what is causing your infection. TREATMENT  Your caregiver may prescribe antibiotic medicines to fight the infection. However, taking antibiotics alone usually does not cure an abscess. Your caregiver may need to make a small cut (incision) in the abscess to drain the pus. In some cases, gauze is packed into the abscess to reduce pain and to continue draining the area. HOME CARE INSTRUCTIONS   Only take over-the-counter or prescription medicines for pain, discomfort, or fever as directed by your caregiver.  If you were prescribed antibiotics, take them as directed. Finish them even if you start to feel better.  If gauze is used, follow your caregiver's directions for changing the gauze.  To avoid spreading the infection:  Keep your draining abscess covered with a  bandage.  Wash your hands well.  Do not share personal care items, towels, or whirlpools with others.  Avoid skin contact with others.  Keep your skin and clothes clean around the abscess.  Keep all follow-up appointments as directed by your caregiver. SEEK MEDICAL CARE IF:   You have increased pain, swelling, redness, fluid drainage, or bleeding.  You have muscle aches, chills, or a general ill feeling.  You have a fever. MAKE SURE YOU:   Understand these instructions.  Will watch your condition.  Will get help right away if you are not doing well or get worse.   This information is not intended to replace advice given to you by your health care provider. Make sure you discuss any questions you have with your health care provider.   Document Released: 01/24/2005 Document Revised: 10/16/2011 Document Reviewed: 06/29/2011 Elsevier Interactive Patient Education 2016 [employer].  Your health care provider has decided you need to take insulin regularly. You have been given a correction scale (also called a sliding scale) in case you need extra insulin when your blood sugar is too high (hyperglycemia). The following instructions will assist you in how to use that correction scale.  WHAT IS A CORRECTION SCALE?  When you check your blood sugar, sometimes it will be higher than your health care provider has told you it should be. You may need an extra dose of insulin to bring your blood sugar to the recommended level (also known as your goal, target, or normal level). The correction scale is prescribed by your health  care provider based on your specific needs.  Your correction scale has two parts:   The first shows you a blood sugar range.   The second part tells you how much extra insulin to give yourself if your blood sugar falls within this range. You will not need an extra dose of insulin if your blood glucose is in the desired range. You should simply give yourself the  normal amount of insulin that your health care provider has ordered for you.  WHY IS IT IMPORTANT TO KEEP YOUR BLOOD SUGAR LEVELS AT YOUR DESIRED LEVEL?  Keeping your blood sugar at the desired level helps to prevent long-term complications of diabetes, such as eye disease, kidney failure, nerve damage, and other serious complications. WHAT TYPE OF INSULIN WILL YOU USE?  To help bring down blood sugar levels that are too high, your health care provider will prescribe a short-acting or a rapid-acting insulin. An example of a short-acting insulin would be regular insulin. Remember, you may also have a longer-acting insulin prescribed for you.  WHAT DO YOU NEED TO DO?   Check your blood sugar with your home blood glucose meter as recommended by your health care provider.   Using your correction scale, find the range that your blood sugar lies in.   Look for the units of insulin that match that blood sugar range. Give yourself the dose of correction insulin your health care provider has prescribed. Always make sure you are using the right type of insulin.   Prior to the injection, make sure you have food available that you can eat in the next 15-30 minutes.   If your correction insulin is rapid acting, start eating your meal within 15 minutes after you have given yourself the insulin injection. If you wait longer than 15 minutes to eat, your blood sugar might get too low.   If your correction insulin is short acting(regular), start eating your meal within 30 minutes after you have given yourself the insulin injection. If you wait longer than 30 minutes to eat, your blood sugar might get too low. Symptoms of low blood sugar (hypoglycemia) may include feeling shaky or weak, sweating, feeling confused, difficulty seeing, agitation, crankiness, or numbness of the lips or tongue. Check your blood sugar immediately and treat your results as directed by your health care provider.   Keep a log of  your blood sugar results with the time you took the test and the amount of insulin that you injected. This information will help your health care provider manage your medicines.   Note on your log anything that may affect your blood sugar level, such as:   Changes in normal exercise or activity.   Changes in your normal schedule, such as staying up late, going on vacation, changing your diet, or holidays.   New medicines. This includes prescription and over-the-counter medicines. Some medicines may cause high blood sugar.   Sickness, stress, or anxiety.   Changes in the time you took your medicine.   Changes in your meals, such as skipping a meal, having a late meal, or dining out.   Eating things that may affect blood glucose, such as snacks, meal portions that are larger than normal, drinks with sugar, or eating less than usual.   Ask your health care provider any questions you have.  Be aware of "stacking" your insulin doses. This happens when you correct a high blood sugar level by giving yourself extra insulin too soon after a previous correction  dose or mealtime dose. You may then have too much insulin still active in your body and may be at risk for hypoglycemia. WHY DO YOU NEED A CORRECTION SCALE IF YOU HAVE NEVER BEEN DIAGNOSED WITH DIABETES?   Keeping your blood glucose in the target range is important for your overall health.   You may have been prescribed medicines that cause your blood glucose to be higher than normal. WHEN SHOULD YOU SEEK MEDICAL CARE? Contact your health care provider if:   You have experienced hypoglycemia that you are unable to treat with your usual routine.   You have a high blood sugar level that is not coming down with the correction dose.  Your blood sugar is often too low or does not come up even if you eat a fast-acting carbohydrate. Someone who lives with you should seek immediate medical care if you become unresponsive.   This  information is not intended to replace advice given to you by your health care provider. Make sure you discuss any questions you have with your health care provider.   Document Released: 09/07/2010 Document Revised: 12/17/2012 Document Reviewed: 09/26/2012 Elsevier Interactive Patient Education 2016 Elsevier Inc[employer] Monilial Vaginitis Vaginitis in a soreness, swelling and redness (inflammation) of the vagina and vulva. Monilial vaginitis is not a sexually transmitted infection. CAUSES  Yeast vaginitis is caused by yeast (candida) that is normally found in your vagina. With a yeast infection, the candida has overgrown in number to a point that upsets the chemical balance. SYMPTOMS   White, thick vaginal discharge.  Swelling, itching, redness and irritation of the vagina and possibly the lips of the vagina (vulva).  Burning or painful urination.  Painful intercourse. DIAGNOSIS  Things that may contribute to monilial vaginitis are:  Postmenopausal and virginal states.  Pregnancy.  Infections.  Being tired, sick or stressed, especially if you had monilial vaginitis in the past.  Diabetes. Good control will help lower the chance.  Birth control pills.  Tight fitting garments.  Using bubble bath, feminine sprays, douches or deodorant tampons.  Taking certain medications that kill germs (antibiotics).  Sporadic recurrence can occur if you become ill. TREATMENT  Your caregiver will give you medication.  There are several kinds of anti monilial vaginal creams and suppositories specific for monilial vaginitis. For recurrent yeast infections, use a suppository or cream in the vagina 2 times a week, or as directed.  Anti-monilial or steroid cream for the itching or irritation of the vulva may also be used. Get your caregiver's permission.  Painting the vagina with methylene blue solution may help if the monilial cream does not work.  Eating yogurt may help prevent monilial  vaginitis. HOME CARE INSTRUCTIONS   Finish all medication as prescribed.  Do not have sex until treatment is completed or after your caregiver tells you it is okay.  Take warm sitz baths.  Do not douche.  Do not use tampons, especially scented ones.  Wear cotton underwear.  Avoid tight pants and panty hose.  Tell your sexual partner that you have a yeast infection. They should go to their caregiver if they have symptoms such as mild rash or itching.  Your sexual partner should be treated as well if your infection is difficult to eliminate.  Practice safer sex. Use condoms.  Some vaginal medications cause latex condoms to fail. Vaginal medications that harm condoms are:  Cleocin cream.  Butoconazole (Femstat).  Terconazole (Terazol) vaginal suppository.  Miconazole (Monistat) (may be purchased over the counter). SEEK  MEDICAL CARE IF:   You have a temperature by mouth above 102 F (38.9 C).  The infection is getting worse after 2 days of treatment.  The infection is not getting better after 3 days of treatment.  You develop blisters in or around your vagina.  You develop vaginal bleeding, and it is not your menstrual period.  You have pain when you urinate.  You develop intestinal problems.  You have pain with sexual intercourse.   This information is not intended to replace advice given to you by your health care provider. Make sure you discuss any questions you have with your health care provider.   Document Released: 01/24/2005 Document Revised: 07/09/2011 Document Reviewed: 10/18/2014 Elsevier Interactive Patient Education [employer].

## 2015-03-12 NOTE — ED Notes (Signed)
CBG read "HI" on CBG machine.

## 2015-03-12 NOTE — ED Notes (Signed)
PA notified of CBG

## 2015-03-28 ENCOUNTER — Emergency Department (HOSPITAL_COMMUNITY)
Admission: EM | Admit: 2015-03-28 | Discharge: 2015-03-28 | Disposition: A | Discharge status: Home / Self Care | Payer: Medicaid Other | Attending: Emergency Medicine | Admitting: Emergency Medicine

## 2015-03-28 ENCOUNTER — Encounter (HOSPITAL_COMMUNITY): Payer: Self-pay | Admitting: Family Medicine

## 2015-03-28 DIAGNOSIS — Z9119 Patient's noncompliance with other medical treatment and regimen: Secondary | ICD-10-CM | POA: Insufficient documentation

## 2015-03-28 DIAGNOSIS — Z880 Allergy status to penicillin: Secondary | ICD-10-CM | POA: Insufficient documentation

## 2015-03-28 DIAGNOSIS — E1065 Type 1 diabetes mellitus with hyperglycemia: Secondary | ICD-10-CM | POA: Insufficient documentation

## 2015-03-28 DIAGNOSIS — Z3202 Encounter for pregnancy test, result negative: Secondary | ICD-10-CM | POA: Insufficient documentation

## 2015-03-28 DIAGNOSIS — R3589 Other polyuria: Secondary | ICD-10-CM

## 2015-03-28 DIAGNOSIS — R52 Pain, unspecified: Secondary | ICD-10-CM | POA: Insufficient documentation

## 2015-03-28 DIAGNOSIS — E860 Dehydration: Secondary | ICD-10-CM | POA: Insufficient documentation

## 2015-03-28 DIAGNOSIS — Z8619 Personal history of other infectious and parasitic diseases: Secondary | ICD-10-CM | POA: Insufficient documentation

## 2015-03-28 DIAGNOSIS — R000 Tachycardia, unspecified: Secondary | ICD-10-CM | POA: Insufficient documentation

## 2015-03-28 DIAGNOSIS — R109 Unspecified abdominal pain: Secondary | ICD-10-CM

## 2015-03-28 DIAGNOSIS — R42 Dizziness and giddiness: Secondary | ICD-10-CM

## 2015-03-28 DIAGNOSIS — R631 Polydipsia: Secondary | ICD-10-CM

## 2015-03-28 DIAGNOSIS — Z91199 Patient's noncompliance with other medical treatment and regimen due to unspecified reason: Secondary | ICD-10-CM

## 2015-03-28 DIAGNOSIS — R739 Hyperglycemia, unspecified: Secondary | ICD-10-CM

## 2015-03-28 DIAGNOSIS — Z794 Long term (current) use of insulin: Secondary | ICD-10-CM | POA: Insufficient documentation

## 2015-03-28 DIAGNOSIS — D849 Immunodeficiency, unspecified: Secondary | ICD-10-CM | POA: Insufficient documentation

## 2015-03-28 DIAGNOSIS — R358 Other polyuria: Secondary | ICD-10-CM

## 2015-03-28 LAB — CBG MONITORING, ED
GLUCOSE-CAPILLARY: 327 mg/dL — AB (ref 65–99)
Glucose-Capillary: 289 mg/dL — ABNORMAL HIGH (ref 65–99)

## 2015-03-28 LAB — I-STAT VENOUS BLOOD GAS, ED
Acid-base deficit: 6 mmol/L — ABNORMAL HIGH (ref 0.0–2.0)
Bicarbonate: 17.9 mEq/L — ABNORMAL LOW (ref 20.0–24.0)
O2 SAT: 67 %
TCO2: 19 mmol/L (ref 0–100)
pCO2, Ven: 30.6 mmHg — ABNORMAL LOW (ref 45.0–50.0)
pH, Ven: 7.375 — ABNORMAL HIGH (ref 7.250–7.300)
pO2, Ven: 35 mmHg (ref 30.0–45.0)

## 2015-03-28 LAB — CBC
HEMATOCRIT: 45.5 % (ref 36.0–46.0)
HEMOGLOBIN: 16.1 g/dL — AB (ref 12.0–15.0)
MCH: 28.4 pg (ref 26.0–34.0)
MCHC: 35.4 g/dL (ref 30.0–36.0)
MCV: 80.4 fL (ref 78.0–100.0)
Platelets: 327 10*3/uL (ref 150–400)
RBC: 5.66 MIL/uL — ABNORMAL HIGH (ref 3.87–5.11)
RDW: 12.7 % (ref 11.5–15.5)
WBC: 6.7 10*3/uL (ref 4.0–10.5)

## 2015-03-28 LAB — URINE MICROSCOPIC-ADD ON

## 2015-03-28 LAB — URINALYSIS, ROUTINE W REFLEX MICROSCOPIC
Bilirubin Urine: NEGATIVE
Glucose, UA: >1000 mg/dL — AB
Hgb urine dipstick: NEGATIVE
Ketones, ur: 40 mg/dL — AB
LEUKOCYTES UA: NEGATIVE
Nitrite: NEGATIVE
PH: 6 (ref 5.0–8.0)
Protein, ur: NEGATIVE mg/dL
SPECIFIC GRAVITY, URINE: 1.041 — AB (ref 1.005–1.030)

## 2015-03-28 LAB — POC URINE PREG, ED: Preg Test, Ur: NEGATIVE

## 2015-03-28 LAB — BASIC METABOLIC PANEL
ANION GAP: 13 (ref 5–15)
BUN: 9 mg/dL (ref 6–20)
CALCIUM: 9.7 mg/dL (ref 8.9–10.3)
CO2: 20 mmol/L — ABNORMAL LOW (ref 22–32)
Chloride: 89 mmol/L — ABNORMAL LOW (ref 101–111)
Creatinine, Ser: 0.91 mg/dL (ref 0.44–1.00)
GFR calc Af Amer: >60 mL/min (ref >60)
Glucose, Bld: 763 mg/dL (ref 65–99)
POTASSIUM: 4.3 mmol/L (ref 3.5–5.1)
SODIUM: 122 mmol/L — AB (ref 135–145)

## 2015-03-28 LAB — MAGNESIUM: MAGNESIUM: 2.1 mg/dL (ref 1.7–2.4)

## 2015-03-28 LAB — PHOSPHORUS: Phosphorus: 4.2 mg/dL (ref 2.5–4.6)

## 2015-03-28 MED ORDER — INSULIN ASPART 100 UNIT/ML ~~LOC~~ SOLN
10.0000 [IU] | Freq: Once | SUBCUTANEOUS | Status: AC
  Administered 2015-03-28: 10 [IU] via SUBCUTANEOUS
  Filled 2015-03-28: qty 1

## 2015-03-28 MED ORDER — INSULIN ASPART 100 UNIT/ML ~~LOC~~ SOLN: 5.0000 [IU] | Freq: Two times a day (BID) | SUBCUTANEOUS | Status: DC

## 2015-03-28 MED ORDER — SODIUM CHLORIDE 0.9 % IV BOLUS (SEPSIS)
2000.0000 mL | Freq: Once | INTRAVENOUS | Status: AC
  Administered 2015-03-28: 2000 mL via INTRAVENOUS

## 2015-03-28 NOTE — ED Provider Notes (Signed)
CSN: [id number]     Arrival date & time 03/28/15  1050 History   First MD Initiated Contact with Patient 03/28/15 1120     Chief Complaint  Patient presents with  . Hyperglycemia     (Consider location/radiation/quality/duration/timing/severity/associated sxs/prior Treatment) HPI Comments: Ashley Freeman is a 24 y.o. female with a PMHx of type 1-IDDM (poorly controlled, noncompliant) and prior DKA, who presents to the ED with complaints of hyperglycemia. Patient states that she ran out of her NovoLog insulin 4 days ago and has had multiple "high" CBG readings at home. She continues to take her Lantus 20 units twice a day, but she has not taken NovoLog in 4 days. Symptoms include polyuria, polydipsia, generalized fatigue, and lightheadedness with standing. She also reports 4 days of bilateral side pain which she denies is abdominal pain but points to the lateral aspect of her abdomen/abdominal wall. She describes as pain is 7/10 constant sore pain which is nonradiating, worse with position changes, and with no treatments tried prior to arrival.  She denies any fevers, chills, URI symptoms, chest pain, shortness breath, nausea, vomiting, diarrhea, constipation, melena, hematochezia, dysuria, hematuria, vaginal bleeding or discharge, numbness, tingling, weakness, headache, or vision changes. She does not have a PCP according to her, but her PCP on file is Jule Ser, DO.  Patient is a 24 y.o. female presenting with hyperglycemia. The history is provided by the patient and medical records. No language interpreter was used.  Hyperglycemia Blood sugar level PTA:  "high" Severity:  Severe Onset quality:  Gradual Duration:  4 days Timing:  Constant Progression:  Unchanged Chronicity:  Recurrent Diabetes status:  Controlled with insulin Current diabetic therapy:  Lantus 20U BID, novolog 5-10U BID sliding scale (ran out of Novolog 4 days ago) Time since last antidiabetic medication:  1  day Context: noncompliance   Relieved by:  None tried Ineffective treatments:  None tried Associated symptoms: abdominal pain ("side pain"), dehydration, fatigue, increased thirst and polyuria   Associated symptoms: no blurred vision, no chest pain, no confusion, no dysuria, no fever, no nausea, no shortness of breath, no vomiting and no weakness   Risk factors: hx of DKA     Past Medical History  Diagnosis Date  . Gonorrhea 08/2011    Treated in 09/2011  . Diabetes mellitus 2001    Diagnosed at age 47 ; Type I  . DKA (diabetic ketoacidoses) ([place]) 08/19/2013   Past Surgical History  Procedure Laterality Date  . No past surgeries    . Incision and drainage perirectal abscess Right 08/18/2013    Procedure: IRRIGATION AND DEBRIDEMENT GLUTEAL ABSCESS;  Surgeon: Ralene Ok, MD;  Location: [facility];  Service: General;  Laterality: Right;  . Incision and drainage perirectal abscess Right 09/19/2013    Procedure: IRRIGATION AND DEBRIDEMENT RIGHT GLUTEAL AND LABIAL ABSCESSES;  Surgeon: Ralene Ok, MD;  Location: [facility];  Service: General;  Laterality: Right;  . Incision and drainage perirectal abscess Right 09/24/2013    Procedure: IRRIGATION AND DEBRIDEMENT PERIRECTAL ABSCESS;  Surgeon: Gwenyth Ober, MD;  Location: [facility];  Service: General;  Laterality: Right;   Family History  Problem Relation Age of Onset  . Anesthesia problems Neg Hx   . Asthma Mother   . Gout Father   . Diabetes Paternal Grandmother    Social History  Substance Use Topics  . Smoking status: Never Smoker   . Smokeless tobacco: Never Used  . Alcohol Use: Yes     Comment: occ  OB History    Gravida Para Term Preterm AB TAB SAB Ectopic Multiple Living   1 0   1  1   0     Review of Systems  Constitutional: Positive for fatigue. Negative for fever and chills.  Eyes: Negative for blurred vision and visual disturbance.  Respiratory: Negative for shortness of breath.   Cardiovascular: Negative for chest pain.   Gastrointestinal: Positive for abdominal pain ("side pain"). Negative for nausea, vomiting, diarrhea, constipation and blood in stool.  Endocrine: Positive for polydipsia and polyuria.  Genitourinary: Negative for dysuria, hematuria, vaginal bleeding and vaginal discharge.  Musculoskeletal: Negative for myalgias and arthralgias.  Skin: Negative for color change.  Allergic/Immunologic: Positive for immunocompromised state (diabetic).  Neurological: Positive for light-headedness (with standing). Negative for weakness, numbness and headaches.  Psychiatric/Behavioral: Negative for confusion.   10 Systems reviewed and are negative for acute change except as noted in the HPI.    Allergies  Penicillins  Home Medications   Prior to Admission medications   Medication Sig Start Date End Date Taking? Authorizing Provider  acetaminophen (TYLENOL) 325 MG tablet Take 1 tablet (325 mg total) by mouth every 6 (six) hours as needed for mild pain or moderate pain. Patient not taking: Reported on 03/12/2015 07/27/14   Luan Moore, MD  bacitracin ointment Apply 1 application topically 2 (two) times daily. Patient not taking: Reported on 03/12/2015 01/15/15   Jarrett Soho Muthersbaugh, PA-C  cyclobenzaprine (FLEXERIL) 5 MG tablet Take 1 tablet (5 mg total) by mouth 3 (three) times daily as needed for muscle spasms. Patient not taking: Reported on 03/12/2015 01/05/15   Ashley Murrain, NP  famotidine (PEPCID) 20 MG tablet Take 1 tablet (20 mg total) by mouth 2 (two) times daily. Patient not taking: Reported on 03/12/2015 01/05/15   Ashley Murrain, NP  insulin aspart (NOVOLOG) 100 UNIT/ML injection Inject 5-10 Units into the skin 2 (two) times daily. Per sliding scale    Historical Provider, MD  insulin glargine (LANTUS) 100 UNIT/ML injection Inject 20 Units into the skin 2 (two) times daily with a meal.    Historical Provider, MD   BP 108/83 mmHg  Pulse 121  Temp(Src) 97.8 F (36.6 C) (Oral)  Resp 18  SpO2  99% Physical Exam  Constitutional: She is oriented to person, place, and time. Vital signs are normal. She appears well-developed and well-nourished.  Non-toxic appearance. No distress.  Afebrile, nontoxic, NAD  HENT:  Head: Normocephalic and atraumatic.  Mouth/Throat: Mucous membranes are dry.  Dry mucous membranes  Eyes: Conjunctivae and EOM are normal. Pupils are equal, round, and reactive to light. Right eye exhibits no discharge. Left eye exhibits no discharge.  Neck: Normal range of motion. Neck supple.  Cardiovascular: Regular rhythm, normal heart sounds and intact distal pulses.  Tachycardia present.  Exam reveals no gallop and no friction rub.   No murmur heard. Mildly tachycardic, likely related to dehydration  Pulmonary/Chest: Effort normal and breath sounds normal. No respiratory distress. She has no decreased breath sounds. She has no wheezes. She has no rhonchi. She has no rales.  Abdominal: Soft. Normal appearance and bowel sounds are normal. She exhibits no distension. There is no tenderness. There is no rigidity, no rebound, no guarding, no CVA tenderness, no tenderness at McBurney's point and negative Murphy's sign.  Soft, NTND, +BS throughout, no r/g/r, neg murphy's, neg mcburney's, no CVA TTP. Unable to reproduce any of her "side pain"  Musculoskeletal: Normal range of motion.  Ashley x4 Strength  and sensation grossly intact Distal pulses intact  Neurological: She is alert and oriented to person, place, and time. She has normal strength. No sensory deficit.  Skin: Skin is warm, dry and intact. No rash noted.  Psychiatric: She has a normal mood and affect.  Nursing note and vitals reviewed.   ED Course  Procedures (including critical care time) Labs Review Labs Reviewed  BASIC METABOLIC PANEL - Abnormal; Notable for the following:    Sodium 122 (*)    Chloride 89 (*)    CO2 20 (*)    Glucose, Bld 763 (*)    All other components within normal limits  CBC - Abnormal;  Notable for the following:    RBC 5.66 (*)    Hemoglobin 16.1 (*)    All other components within normal limits  URINALYSIS, ROUTINE W REFLEX MICROSCOPIC (NOT AT [facility]) - Abnormal; Notable for the following:    APPearance CLOUDY (*)    Specific Gravity, Urine 1.041 (*)    Glucose, UA >1000 (*)    Ketones, ur 40 (*)    All other components within normal limits  URINE MICROSCOPIC-ADD ON - Abnormal; Notable for the following:    Squamous Epithelial / LPF 6-30 (*)    Bacteria, UA FEW (*)    All other components within normal limits  CBG MONITORING, ED - Abnormal; Notable for the following:    Glucose-Capillary >600 (*)    All other components within normal limits  CBG MONITORING, ED - Abnormal; Notable for the following:    Glucose-Capillary 327 (*)    All other components within normal limits  I-STAT VENOUS BLOOD GAS, ED - Abnormal; Notable for the following:    pH, Ven 7.375 (*)    pCO2, Ven 30.6 (*)    Bicarbonate 17.9 (*)    Acid-base deficit 6.0 (*)    All other components within normal limits  CBG MONITORING, ED - Abnormal; Notable for the following:    Glucose-Capillary 289 (*)    All other components within normal limits  MAGNESIUM  PHOSPHORUS  BLOOD GAS, VENOUS  POC URINE PREG, ED    Imaging Review No results found. I have personally reviewed and evaluated these images and lab results as part of my medical decision-making.   EKG Interpretation   Date/Time:  Monday March 28 2015 11:54:31 EST Ventricular Rate:  100 PR Interval:  118 QRS Duration: 74 QT Interval:  335 QTC Calculation: 432 R Axis:   56 Text Interpretation:  Sinus tachycardia Probable left atrial enlargement  RSR' in V1 or V2, probably normal variant Confirmed by Jeneen Rinks  MD, [place]  [phone]) on 03/28/2015 1:00:04 PM      MDM   Final diagnoses:  Hyperglycemia  Noncompliance with diabetes treatment  Polyuria  Polydipsia  Orthostatic lightheadedness  Side pain  Tachycardia  Dehydration     24 y.o. female here with hyperglycemia. She ran out of her NovoLog 4 days ago, has been taking her Lantus as prescribed, but her CBGs have been running "high" at home. She complains of polydipsia and polyuria, lightheadedness with standing, and fatigue. Also complains of bilateral side pain but denies that this is abdominal pain. On exam, dry mucous membranes, patient appears dehydrated, no abdominal tenderness. CBG in triage >600. Will obtain labs including VBG, Mg, Phos, and get U/A and Upreg. Will get EKG due to her lightheadedness complaint, but doubt need for troponin or other work up, likely related to dehydration/hyperglycemia. Will give fluids 2L bolus and 10U  subQ novolog (home dose), will hold off on glucostabilizer unless she is in fact in DKA. Will reassess shortly.   12:59 PM VBG with no acidosis, slightly low bicarb but BMP with no anion gap and bicarb 20 therefore doubt DKA at this time. BMP with NA 122 which corrects to 138, potassium WNL at 4.3, gluc 763, no anion gap. CBC with clear hemoconcentration, no concerning findings. Mg and Phos WNL. EKG with sinus tachy at 100 but no ischemic changes, HR improving with fluids, still has another 1L bolus to go. Still awaiting U/A and Upreg. Once these result, and all fluids finished, will recheck CBG. Doubt need for admission at this time given her lab findings not being concerning for DKA, but will monitor closely.   2:32 PM Upreg neg. U/A with few ketones but this has been present nearly every time she's seen here, no evidence of UTI, appears somewhat contaminated. CBG after 1.5L boluses and 10U subQ novolog down to 289, and symptoms/VS improving with fluids. Given no acidosis, no anion gap, doubt DKA or need for admission. Discussed that she needs to take novolog, pt was seen by care management and instructed on how to get these [facility]. Also instructed on f/up with PCP on 12/9. Discussed diet modifications to help avoid hyperglycemia.  I explained the diagnosis and have given explicit precautions to return to the ER including for any other new or worsening symptoms. The patient understands and accepts the medical plan as it's been dictated and I have answered their questions. Discharge instructions concerning home care and prescriptions have been given. The patient is STABLE and is discharged to home in good condition.   BP 104/72 mmHg  Pulse 93  Temp(Src) 97.8 F (36.6 C) (Oral)  Resp 15  SpO2 99%  Meds ordered this encounter  Medications  . sodium chloride 0.9 % bolus 2,000 mL    Sig:   . insulin aspart (novoLOG) injection 10 Units    Sig:   . insulin aspart (NOVOLOG) 100 UNIT/ML injection    Sig: Inject 5-10 Units into the skin 2 (two) times daily with a meal. Per sliding scale    Dispense:  10 mL    Refill:  2    Order Specific Question:  Supervising Provider    Answer:  Jenny Reichmann Camprubi-Soms, PA-C 03/28/15 1437  Tanna Furry, MD 04/06/15 [phone]

## 2015-03-28 NOTE — ED Notes (Signed)
EDP [facility] notified of critical lab value (Blood glucose 763).

## 2015-03-28 NOTE — Discharge Instructions (Signed)
Your blood sugar is going up to a dangerously high level, you must take your insulin Lantus and Novolog as directed. Stay well hydrated with plenty of water. Avoid high carbohydrate foods, avoid sugary foods/drinks. Use tylenol or motrin as needed for pain. Follow up with [facility] for pharmacy needs and the [facility] listed above for primary care needs, as instructed by the case manager you spoke with today. Return to the ER for changes or worsening symptoms.    Hyperglycemia High blood sugar (hyperglycemia) means that the level of sugar in your blood is higher than it should be. Signs of high blood sugar include:  Feeling thirsty.  Frequent peeing (urinating).  Feeling tired or sleepy.  Dry mouth.  Vision changes.  Feeling weak.  Feeling hungry but losing weight.  Numbness and tingling in your hands or feet.  Headache. When you ignore these signs, your blood sugar may keep going up. These problems may get worse, and other problems may begin. HOME CARE  Check your blood sugars as told by your doctor. Write down the numbers with the date and time.  Take the right amount of insulin or diabetes pills at the right time. Write down the dose with date and time.  Refill your insulin or diabetes pills before running out.  Watch what you eat. Follow your meal plan.  Drink liquids without sugar, such as water. Check with your doctor if you have kidney or heart disease.  Follow your doctor's orders for exercise. Exercise at the same time of day.  Keep your doctor's appointments. GET HELP RIGHT AWAY IF:   You have trouble thinking or are confused.  You have fast breathing with fruity smelling breath.  You pass out (faint).  You have 2 to 3 days of high blood sugars and you do not know why.  You have chest pain.  You are feeling sick to your stomach (nauseous) or throwing up (vomiting).  You have sudden vision changes. MAKE SURE YOU:   Understand  these instructions.  Will watch your condition.  Will get help right away if you are not doing well or get worse.   This information is not intended to replace advice given to you by your health care provider. Make sure you discuss any questions you have with your health care provider.   Document Released: 02/11/2009 Document Revised: 05/07/2014 Document Reviewed: 12/21/2014 Elsevier Interactive Patient Education [employer].

## 2015-03-28 NOTE — Care Management Note (Signed)
Case Management Note  Patient Details  Name: KHANDI KERNES MRN: [id number] Date of Birth: [birth year]  Subjective/Objective:                  24 yo female in ER with hyperglycemia r/t not being able to afford insulin.//Home alone.  Action/Plan: Follow for disposition needs.   Expected Discharge Date:       03/28/15           Expected Discharge Plan:  Home/Self Care  In-House Referral:  PCP / [facility]  Discharge planning Services  CM Consult, Follow-up appt scheduled  Post Acute Care Choice:  NA Choice offered to:  Patient  DME Arranged:  N/A DME Agency:  NA  HH Arranged:  NA HH Agency:  NA  Status of Service:  Completed, signed off  Medicare Important Message Given:    Date Medicare IM Given:    Medicare IM give by:    Date Additional Medicare IM Given:    Additional Medicare Important Message give by:     If discussed at [place] of Stay Meetings, dates discussed:    Additional Comments: Hermena Swint J. Clydene Laming, RN, BSN, [place] [phone] ED CM consulted regarding PCP establishment and insurance enrollment. Pt presented to [facility] ED today with hypergylcemia. NCM met with pt at bedside; pt confirms not having access to f/u care with PCP or insurance coverage. Discussed with patient importance and benefits of establishing PCP, and not utilizing the ED for primary care needs. Pt verbalized understanding and is in agreement. Discussed other options, provided list of local  affordable PCPs.  Pt voiced interest in the [facility] and [employer].  NCM advised that [facility]  Internal Medicine providers are seeing pts at [facility]. Pt verbalized understanding. NCM set up appointment with Cammie Sickle, NP 12/9 at 0900.  Fuller Mandril, RN 03/28/2015, 1:49 PM

## 2015-03-28 NOTE — ED Notes (Signed)
Pt here for hyperglycemia. sts that she hasn't had any of her meds since Thanksgiving. sts sweet taste in mouth, weakness, fatigue.

## 2015-04-08 ENCOUNTER — Ambulatory Visit: Payer: Medicaid Other | Admitting: Family Medicine

## 2015-04-27 ENCOUNTER — Encounter (HOSPITAL_COMMUNITY): Payer: Self-pay | Admitting: Emergency Medicine

## 2015-04-27 ENCOUNTER — Emergency Department (HOSPITAL_COMMUNITY)
Admission: EM | Admit: 2015-04-27 | Discharge: 2015-04-28 | Discharge status: Against Medical Advice | Payer: Medicaid Other | Attending: Emergency Medicine | Admitting: Emergency Medicine

## 2015-04-27 DIAGNOSIS — E871 Hypo-osmolality and hyponatremia: Secondary | ICD-10-CM | POA: Insufficient documentation

## 2015-04-27 DIAGNOSIS — B9689 Other specified bacterial agents as the cause of diseases classified elsewhere: Secondary | ICD-10-CM

## 2015-04-27 DIAGNOSIS — N898 Other specified noninflammatory disorders of vagina: Secondary | ICD-10-CM

## 2015-04-27 DIAGNOSIS — E875 Hyperkalemia: Secondary | ICD-10-CM | POA: Insufficient documentation

## 2015-04-27 DIAGNOSIS — A5901 Trichomonal vulvovaginitis: Secondary | ICD-10-CM

## 2015-04-27 DIAGNOSIS — Z794 Long term (current) use of insulin: Secondary | ICD-10-CM | POA: Insufficient documentation

## 2015-04-27 DIAGNOSIS — Z9114 Patient's other noncompliance with medication regimen: Secondary | ICD-10-CM | POA: Insufficient documentation

## 2015-04-27 DIAGNOSIS — B373 Candidiasis of vulva and vagina: Secondary | ICD-10-CM

## 2015-04-27 DIAGNOSIS — Z880 Allergy status to penicillin: Secondary | ICD-10-CM | POA: Insufficient documentation

## 2015-04-27 DIAGNOSIS — Z91199 Patient's noncompliance with other medical treatment and regimen due to unspecified reason: Secondary | ICD-10-CM

## 2015-04-27 DIAGNOSIS — R739 Hyperglycemia, unspecified: Secondary | ICD-10-CM

## 2015-04-27 DIAGNOSIS — B379 Candidiasis, unspecified: Secondary | ICD-10-CM | POA: Insufficient documentation

## 2015-04-27 DIAGNOSIS — B3731 Acute candidiasis of vulva and vagina: Secondary | ICD-10-CM

## 2015-04-27 DIAGNOSIS — N760 Acute vaginitis: Secondary | ICD-10-CM | POA: Insufficient documentation

## 2015-04-27 DIAGNOSIS — E1065 Type 1 diabetes mellitus with hyperglycemia: Secondary | ICD-10-CM | POA: Insufficient documentation

## 2015-04-27 DIAGNOSIS — R000 Tachycardia, unspecified: Secondary | ICD-10-CM | POA: Insufficient documentation

## 2015-04-27 DIAGNOSIS — Z9119 Patient's noncompliance with other medical treatment and regimen: Secondary | ICD-10-CM

## 2015-04-27 DIAGNOSIS — Z8619 Personal history of other infectious and parasitic diseases: Secondary | ICD-10-CM | POA: Insufficient documentation

## 2015-04-27 LAB — URINE MICROSCOPIC-ADD ON

## 2015-04-27 LAB — URINALYSIS, ROUTINE W REFLEX MICROSCOPIC
Bilirubin Urine: NEGATIVE
Glucose, UA: >1000 mg/dL — AB
Hgb urine dipstick: NEGATIVE
Ketones, ur: NEGATIVE mg/dL
NITRITE: NEGATIVE
Protein, ur: NEGATIVE mg/dL
Specific Gravity, Urine: 1.005 (ref 1.005–1.030)
pH: 6 (ref 5.0–8.0)

## 2015-04-27 LAB — CBC WITH DIFFERENTIAL/PLATELET
BASOS ABS: 0 10*3/uL (ref 0.0–0.1)
BASOS PCT: 0 %
EOS ABS: 0.6 10*3/uL (ref 0.0–0.7)
Eosinophils Relative: 6 %
HCT: 40.9 % (ref 36.0–46.0)
Hemoglobin: 13.4 g/dL (ref 12.0–15.0)
Lymphocytes Relative: 35 %
Lymphs Abs: 3.1 10*3/uL (ref 0.7–4.0)
MCH: 27.7 pg (ref 26.0–34.0)
MCHC: 32.8 g/dL (ref 30.0–36.0)
MCV: 84.7 fL (ref 78.0–100.0)
MONO ABS: 0.5 10*3/uL (ref 0.1–1.0)
Monocytes Relative: 5 %
NEUTROS ABS: 4.8 10*3/uL (ref 1.7–7.7)
Neutrophils Relative %: 54 %
PLATELETS: 369 10*3/uL (ref 150–400)
RBC: 4.83 MIL/uL (ref 3.87–5.11)
RDW: 12.9 % (ref 11.5–15.5)
WBC: 9 10*3/uL (ref 4.0–10.5)

## 2015-04-27 LAB — BASIC METABOLIC PANEL
ANION GAP: 10 (ref 5–15)
BUN: 10 mg/dL (ref 6–20)
CALCIUM: 9.1 mg/dL (ref 8.9–10.3)
CO2: 23 mmol/L (ref 22–32)
Chloride: 90 mmol/L — ABNORMAL LOW (ref 101–111)
Creatinine, Ser: 0.81 mg/dL (ref 0.44–1.00)
Glucose, Bld: 949 mg/dL (ref 65–99)
Potassium: 5.3 mmol/L — ABNORMAL HIGH (ref 3.5–5.1)
SODIUM: 123 mmol/L — AB (ref 135–145)

## 2015-04-27 LAB — WET PREP, GENITAL
SPERM: NONE SEEN
Yeast Wet Prep HPF POC: NONE SEEN

## 2015-04-27 LAB — POC URINE PREG, ED: PREG TEST UR: NEGATIVE

## 2015-04-27 MED ORDER — CEFTRIAXONE SODIUM 250 MG IJ SOLR
250.0000 mg | Freq: Once | INTRAMUSCULAR | Status: AC
  Administered 2015-04-28: 250 mg via INTRAMUSCULAR
  Filled 2015-04-27: qty 250

## 2015-04-27 MED ORDER — FLUCONAZOLE 100 MG PO TABS
150.0000 mg | ORAL_TABLET | Freq: Once | ORAL | Status: AC
  Administered 2015-04-28: 150 mg via ORAL
  Filled 2015-04-27: qty 2

## 2015-04-27 MED ORDER — METRONIDAZOLE 500 MG PO TABS
2000.0000 mg | ORAL_TABLET | Freq: Once | ORAL | Status: AC
  Administered 2015-04-28: 2000 mg via ORAL
  Filled 2015-04-27: qty 4

## 2015-04-27 MED ORDER — METRONIDAZOLE 500 MG PO TABS: 500.0000 mg | ORAL_TABLET | Freq: Two times a day (BID) | ORAL | Status: DC

## 2015-04-27 MED ORDER — AZITHROMYCIN 250 MG PO TABS
1000.0000 mg | ORAL_TABLET | Freq: Once | ORAL | Status: AC
  Administered 2015-04-28: 1000 mg via ORAL
  Filled 2015-04-27: qty 4

## 2015-04-27 NOTE — ED Notes (Signed)
This RN explained importance of getting an IV and initiating IV fluids to decrease her glucose level. Patient refuses IV at this time, stating that it is not why she came in and would take her medicine when she gets home. MD made aware.

## 2015-04-27 NOTE — ED Notes (Signed)
Patient ambulatory to restroom and back with steady gait. Appears to be in NAD at this time.

## 2015-04-27 NOTE — ED Notes (Signed)
Pt. reports vaginal itching , low abdominal cramping and malodorous vaginal discharge onset this week , denies fever or dysuria .

## 2015-04-27 NOTE — ED Notes (Signed)
PA at bedside.

## 2015-04-27 NOTE — Discharge Instructions (Signed)
DO NOT ENGAGE IN UNPROTECTED SEXUAL INTERCOURSE UNTIL YOUR PARTNERS HAVE BEEN TESTED AND TREATED! You were found to have trichomonas which is a sexually transmitted infection, you were treated for this here but if you have sex again before your partners are treated then you could catch it again from them. Follow up with [facility] STD clinic for future STD concerns or screenings. This is the recommendation by the CDC for people with multiple sexual partners or hx of STDs. You have been treated for gonorrhea and chlamydia in the ER but the hospital will call you if lab is positive. You were tested for HIV and Syphilis, and the hospital will call you if the lab is positive. You were treated for a yeast infection today. You also have bacterial vaginosis, take flagyl as directed. You must take your diabetes medications appropriately in order to help lower your blood sugar, you have refused treatment for your elevated blood sugar here but it is very important that you take your home medications. If you do not lower your blood sugar, you will likely continue getting vaginal yeast infections. You must call your regular doctor in the morning for ongoing monitoring of your medical conditions and re-evaluation of your high blood sugar. Return to the ER for changes or worsening symptoms.   Bacterial Vaginosis Bacterial vaginosis is a vaginal infection that occurs when the normal balance of bacteria in the vagina is disrupted. It results from an overgrowth of certain bacteria. This is the most common vaginal infection in women of childbearing age. Treatment is important to prevent complications, especially in pregnant women, as it can cause a premature delivery. CAUSES  Bacterial vaginosis is caused by an increase in harmful bacteria that are normally present in smaller amounts in the vagina. Several different kinds of bacteria can cause bacterial vaginosis. However, the reason that the condition  develops is not fully understood. RISK FACTORS Certain activities or behaviors can put you at an increased risk of developing bacterial vaginosis, including:  Having a new sex partner or multiple sex partners.  Douching.  Using an intrauterine device (IUD) for contraception. Women do not get bacterial vaginosis from toilet seats, bedding, swimming pools, or contact with objects around them. SIGNS AND SYMPTOMS  Some women with bacterial vaginosis have no signs or symptoms. Common symptoms include:  Grey vaginal discharge.  A fishlike odor with discharge, especially after sexual intercourse.  Itching or burning of the vagina and vulva.  Burning or pain with urination. DIAGNOSIS  Your health care provider will take a medical history and examine the vagina for signs of bacterial vaginosis. A sample of vaginal fluid may be taken. Your health care provider will look at this sample under a microscope to check for bacteria and abnormal cells. A vaginal pH test may also be done.  TREATMENT  Bacterial vaginosis may be treated with antibiotic medicines. These may be given in the form of a pill or a vaginal cream. A second round of antibiotics may be prescribed if the condition comes back after treatment. Because bacterial vaginosis increases your risk for sexually transmitted diseases, getting treated can help reduce your risk for chlamydia, gonorrhea, HIV, and herpes. HOME CARE INSTRUCTIONS   Only take over-the-counter or prescription medicines as directed by your health care provider.  If antibiotic medicine was prescribed, take it as directed. Make sure you finish it even if you start to feel better.  Tell all sexual partners that you have a vaginal infection. They should  see their health care provider and be treated if they have problems, such as a mild rash or itching.  During treatment, it is important that you follow these instructions:  Avoid sexual activity or use condoms  correctly.  Do not douche.  Avoid alcohol as directed by your health care provider.  Avoid breastfeeding as directed by your health care provider. SEEK MEDICAL CARE IF:   Your symptoms are not improving after 3 days of treatment.  You have increased discharge or pain.  You have a fever. MAKE SURE YOU:   Understand these instructions.  Will watch your condition.  Will get help right away if you are not doing well or get worse. FOR MORE INFORMATION  Centers for Disease Control and Prevention, Division of STD Prevention: [employer] American Sexual Health Association (ASHA): www.ashastd.org    This information is not intended to replace advice given to you by your health care provider. Make sure you discuss any questions you have with your health care provider.   Document Released: 04/16/2005 Document Revised: 05/07/2014 Document Reviewed: 11/26/2012 Elsevier Interactive Patient Education 2016 [employer].  Sexually Transmitted Disease A sexually transmitted disease (STD) is a disease or infection that may be passed (transmitted) from person to person, usually during sexual activity. This may happen by way of saliva, semen, blood, vaginal mucus, or urine. Common STDs include:  Gonorrhea.  Chlamydia.  Syphilis.  HIV and AIDS.  Genital herpes.  Hepatitis B and C.  Trichomonas.  Human papillomavirus (HPV).  Pubic lice.  Scabies.  Mites.  Bacterial vaginosis. WHAT ARE CAUSES OF STDs? An STD may be caused by bacteria, a virus, or parasites. STDs are often transmitted during sexual activity if one person is infected. However, they may also be transmitted through nonsexual means. STDs may be transmitted after:   Sexual intercourse with an infected person.  Sharing sex toys with an infected person.  Sharing needles with an infected person or using unclean piercing or tattoo needles.  Having intimate contact with the genitals, mouth, or rectal areas of an  infected person.  Exposure to infected fluids during birth. WHAT ARE THE SIGNS AND SYMPTOMS OF STDs? Different STDs have different symptoms. Some people may not have any symptoms. If symptoms are present, they may include:  Painful or bloody urination.  Pain in the pelvis, abdomen, vagina, anus, throat, or eyes.  A skin rash, itching, or irritation.  Growths, ulcerations, blisters, or sores in the genital and anal areas.  Abnormal vaginal discharge with or without bad odor.  Penile discharge in men.  Fever.  Pain or bleeding during sexual intercourse.  Swollen glands in the groin area.  Yellow skin and eyes (jaundice). This is seen with hepatitis.  Swollen testicles.  Infertility.  Sores and blisters in the mouth. HOW ARE STDs DIAGNOSED? To make a diagnosis, your health care provider may:  Take a medical history.  Perform a physical exam.  Take a sample of any discharge to examine.  Swab the throat, cervix, opening to the penis, rectum, or vagina for testing.  Test a sample of your first morning urine.  Perform blood tests.  Perform a Pap test, if this applies.  Perform a colposcopy.  Perform a laparoscopy. HOW ARE STDs TREATED? Treatment depends on the STD. Some STDs may be treated but not cured.  Chlamydia, gonorrhea, trichomonas, and syphilis can be cured with antibiotic medicine.  Genital herpes, hepatitis, and HIV can be treated, but not cured, with prescribed medicines. The medicines lessen symptoms.  Genital warts from HPV can be treated with medicine or by freezing, burning (electrocautery), or surgery. Warts may come back.  HPV cannot be cured with medicine or surgery. However, abnormal areas may be removed from the cervix, vagina, or vulva.  If your diagnosis is confirmed, your recent sexual partners need treatment. This is true even if they are symptom-free or have a negative culture or evaluation. They should not have sex until their health  care providers say it is okay.  Your health care provider may test you for infection again 3 months after treatment. HOW CAN I REDUCE MY RISK OF GETTING AN STD? Take these steps to reduce your risk of getting an STD:  Use latex condoms, dental dams, and water-soluble lubricants during sexual activity. Do not use petroleum jelly or oils.  Avoid having multiple sex partners.  Do not have sex with someone who has other sex partners  Do not have sex with anyone you do not know or who is at high risk for an STD.  Avoid risky sex practices that can break your skin.  Do not have sex if you have open sores on your mouth or skin.  Avoid drinking too much alcohol or taking illegal drugs. Alcohol and drugs can affect your judgment and put you in a vulnerable position.  Avoid engaging in oral and anal sex acts.  Get vaccinated for HPV and hepatitis. If you have not received these vaccines in the past, talk to your health care provider about whether one or both might be right for you.  If you are at risk of being infected with HIV, it is recommended that you take a prescription medicine daily to prevent HIV infection. This is called pre-exposure prophylaxis (PrEP). You are considered at risk if:  You are a man who has sex with other men (MSM).  You are a heterosexual man or woman and are sexually active with more than one partner.  You take drugs by injection.  You are sexually active with a partner who has HIV.  Talk with your health care provider about whether you are at high risk of being infected with HIV. If you choose to begin PrEP, you should first be tested for HIV. You should then be tested every 3 months for as long as you are taking PrEP. WHAT SHOULD I DO IF I THINK I HAVE AN STD?  See your health care provider.  Tell your sexual partner(s). They should be tested and treated for any STDs.  Do not have sex until your health care provider says it is okay. WHEN SHOULD I GET  IMMEDIATE MEDICAL CARE? Contact your health care provider right away if:   You have severe abdominal pain.  You are a man and notice swelling or pain in your testicles.  You are a woman and notice swelling or pain in your vagina.   This information is not intended to replace advice given to you by your health care provider. Make sure you discuss any questions you have with your health care provider.   Document Released: 07/07/2002 Document Revised: 05/07/2014 Document Reviewed: 11/04/2012 Elsevier Interactive Patient Education 2016 [employer].  Trichomoniasis Trichomoniasis is an infection caused by an organism called Trichomonas. The infection can affect both women and men. In women, the outer female genitalia and the vagina are affected. In men, the penis is mainly affected, but the prostate and other reproductive organs can also be involved. Trichomoniasis is a sexually transmitted infection (STI) and is most  often passed to another person through sexual contact.  RISK FACTORS  Having unprotected sexual intercourse.  Having sexual intercourse with an infected partner. SIGNS AND SYMPTOMS  Symptoms of trichomoniasis in women include:  Abnormal gray-green frothy vaginal discharge.  Itching and irritation of the vagina.  Itching and irritation of the area outside the vagina. Symptoms of trichomoniasis in men include:   Penile discharge with or without pain.  Pain during urination. This results from inflammation of the urethra. DIAGNOSIS  Trichomoniasis may be found during a Pap test or physical exam. Your health care provider may use one of the following methods to help diagnose this infection:  Testing the pH of the vagina with a test tape.  Using a vaginal swab test that checks for the Trichomonas organism. A test is available that provides results within a few minutes.  Examining a urine sample.  Testing vaginal secretions. Your health care provider may test you for  other STIs, including HIV. TREATMENT   You may be given medicine to fight the infection. Women should inform their health care provider if they could be or are pregnant. Some medicines used to treat the infection should not be taken during pregnancy.  Your health care provider may recommend over-the-counter medicines or creams to decrease itching or irritation.  Your sexual partner will need to be treated if infected.  Your health care provider may test you for infection again 3 months after treatment. HOME CARE INSTRUCTIONS   Take medicines only as directed by your health care provider.  Take over-the-counter medicine for itching or irritation as directed by your health care provider.  Do not have sexual intercourse while you have the infection.  Women should not douche or wear tampons while they have the infection.  Discuss your infection with your partner. Your partner may have gotten the infection from you, or you may have gotten it from your partner.  Have your sex partner get examined and treated if necessary.  Practice safe, informed, and protected sex.  See your health care provider for other STI testing. SEEK MEDICAL CARE IF:   You still have symptoms after you finish your medicine.  You develop abdominal pain.  You have pain when you urinate.  You have bleeding after sexual intercourse.  You develop a rash.  Your medicine makes you sick or makes you throw up (vomit). MAKE SURE YOU:  Understand these instructions.  Will watch your condition.  Will get help right away if you are not doing well or get worse.   This information is not intended to replace advice given to you by your health care provider. Make sure you discuss any questions you have with your health care provider.   Document Released: 10/10/2000 Document Revised: 05/07/2014 Document Reviewed: 01/26/2013 Elsevier Interactive Patient Education 2016 Else[employer] Inc.  Monilial Vaginitis Vaginitis  in a soreness, swelling and redness (inflammation) of the vagina and vulva. Monilial vaginitis is not a sexually transmitted infection. CAUSES  Yeast vaginitis is caused by yeast (candida) that is normally found in your vagina. With a yeast infection, the candida has overgrown in number to a point that upsets the chemical balance. SYMPTOMS   White, thick vaginal discharge.  Swelling, itching, redness and irritation of the vagina and possibly the lips of the vagina (vulva).  Burning or painful urination.  Painful intercourse. DIAGNOSIS  Things that may contribute to monilial vaginitis are:  Postmenopausal and virginal states.  Pregnancy.  Infections.  Being tired, sick or stressed, especially if  you had monilial vaginitis in the past.  Diabetes. Good control will help lower the chance.  Birth control pills.  Tight fitting garments.  Using bubble bath, feminine sprays, douches or deodorant tampons.  Taking certain medications that kill germs (antibiotics).  Sporadic recurrence can occur if you become ill. TREATMENT  Your caregiver will give you medication.  There are several kinds of anti monilial vaginal creams and suppositories specific for monilial vaginitis. For recurrent yeast infections, use a suppository or cream in the vagina 2 times a week, or as directed.  Anti-monilial or steroid cream for the itching or irritation of the vulva may also be used. Get your caregiver's permission.  Painting the vagina with methylene blue solution may help if the monilial cream does not work.  Eating yogurt may help prevent monilial vaginitis. HOME CARE INSTRUCTIONS   Finish all medication as prescribed.  Do not have sex until treatment is completed or after your caregiver tells you it is okay.  Take warm sitz baths.  Do not douche.  Do not use tampons, especially scented ones.  Wear cotton underwear.  Avoid tight pants and panty hose.  Tell your sexual partner that  you have a yeast infection. They should go to their caregiver if they have symptoms such as mild rash or itching.  Your sexual partner should be treated as well if your infection is difficult to eliminate.  Practice safer sex. Use condoms.  Some vaginal medications cause latex condoms to fail. Vaginal medications that harm condoms are:  Cleocin cream.  Butoconazole (Femstat).  Terconazole (Terazol) vaginal suppository.  Miconazole (Monistat) (may be purchased over the counter). SEEK MEDICAL CARE IF:   You have a temperature by mouth above 102 F (38.9 C).  The infection is getting worse after 2 days of treatment.  The infection is not getting better after 3 days of treatment.  You develop blisters in or around your vagina.  You develop vaginal bleeding, and it is not your menstrual period.  You have pain when you urinate.  You develop intestinal problems.  You have pain with sexual intercourse.   This information is not intended to replace advice given to you by your health care provider. Make sure you discuss any questions you have with your health care provider.   Document Released: 01/24/2005 Document Revised: 07/09/2011 Document Reviewed: 10/18/2014 Elsevier Interactive Patient Education 2016 [employer].  Vaginitis Vaginitis is an inflammation of the vagina. It can happen when the normal bacteria and yeast in the vagina grow too much. There are different types. Treatment will depend on the type you have. HOME CARE  Take all medicines as told by your doctor.  Keep your vagina area clean and dry. Avoid soap. Rinse the area with water.  Avoid washing and cleaning out the vagina (douching).  Do not use tampons or have sex (intercourse) until your treatment is done.  Wipe from front to back after going to the restroom.  Wear cotton underwear.  Avoid wearing underwear while you sleep until your vaginitis is gone.  Avoid tight pants. Avoid underwear or  nylons without a cotton panel.  Take off wet clothing (such as a bathing suit) as soon as you can.  Use mild, unscented products. Avoid fabric softeners and scented:  Feminine sprays.  Laundry detergents.  Tampons.  Soaps or bubble baths.  Practice safe sex and use condoms. GET HELP RIGHT AWAY IF:   You have belly (abdominal) pain.  You have a fever or lasting symptoms  for more than 2-3 days.  You have a fever and your symptoms suddenly get worse. MAKE SURE YOU:   Understand these instructions.  Will watch this condition.  Will get help right away if you are not doing well or get worse.   This information is not intended to replace advice given to you by your health care provider. Make sure you discuss any questions you have with your health care provider.   Document Released: 07/13/2008 Document Revised: 01/09/2012 Document Reviewed: 09/27/2011 Elsevier Interactive Patient Education 2016 [employer].  Hyperglycemia High blood sugar (hyperglycemia) means that the level of sugar in your blood is higher than it should be. Signs of high blood sugar include:  Feeling thirsty.  Frequent peeing (urinating).  Feeling tired or sleepy.  Dry mouth.  Vision changes.  Feeling weak.  Feeling hungry but losing weight.  Numbness and tingling in your hands or feet.  Headache. When you ignore these signs, your blood sugar may keep going up. These problems may get worse, and other problems may begin. HOME CARE  Check your blood sugars as told by your doctor. Write down the numbers with the date and time.  Take the right amount of insulin or diabetes pills at the right time. Write down the dose with date and time.  Refill your insulin or diabetes pills before running out.  Watch what you eat. Follow your meal plan.  Drink liquids without sugar, such as water. Check with your doctor if you have kidney or heart disease.  Follow your doctor's orders for exercise.  Exercise at the same time of day.  Keep your doctor's appointments. GET HELP RIGHT AWAY IF:   You have trouble thinking or are confused.  You have fast breathing with fruity smelling breath.  You pass out (faint).  You have 2 to 3 days of high blood sugars and you do not know why.  You have chest pain.  You are feeling sick to your stomach (nauseous) or throwing up (vomiting).  You have sudden vision changes. MAKE SURE YOU:   Understand these instructions.  Will watch your condition.  Will get help right away if you are not doing well or get worse.   This information is not intended to replace advice given to you by your health care provider. Make sure you discuss any questions you have with your health care provider.   Document Released: 02/11/2009 Document Revised: 05/07/2014 Document Reviewed: 12/21/2014 Elsevier Interactive Patient Education [employer].

## 2015-04-27 NOTE — ED Provider Notes (Signed)
CSN: [id number]     Arrival date & time 04/27/15  1952 History   First MD Initiated Contact with Patient 04/27/15 2319     Chief Complaint  Patient presents with  . Vaginal Itching  . Vaginal Discharge     (Consider location/radiation/quality/duration/timing/severity/associated sxs/prior Treatment) HPI Comments: Ashley Freeman is a 24 y.o. female with a PMHx of type 1-IDDM (poorly controlled, noncompliant) and prior DKA, who presents to the ED with complaints of 2 days of vaginal itching and white thick discharge. Additionally she reports approximately 1 week of intermittent lower abdominal pain which has completely resolved at this time, but was previously described as 8/10 aching in the lower abdomen, intermittent, nonradiating, worse with "holding my urine" and improved with Advil. She reiterates that this pain is completely resolved at this time. She is sexually active with one female partner, unprotected. LMP 11/20. She denies any fevers, chills, chest pain, shortness breath, ongoing abdominal pain, n/v/d/c, melena, hematochezia, dysuria, hematuria, urinary frequency or urgency, genital lesions, vaginal bleeding, numbness, tingling, or weakness.  Of note, patient states that she took her Lantus 20 units this morning as well as NovoLog 10 units but has not taken any other diabetes medications since early this morning. Upon arrival she was found to have a blood sugar of 949, but is refusing to have any IVs placed or to have any treatment for her hyperglycemia. She states she will take her NovoLog when she gets home.  Patient is a 24 y.o. female presenting with vaginal itching and vaginal discharge. The history is provided by the patient and medical records. No language interpreter was used.  Vaginal Itching This is a new problem. The current episode started in the past 7 days. The problem occurs constantly. The problem has been unchanged. Associated symptoms include abdominal pain  (intermittent, currently resolved). Pertinent negatives include no arthralgias, chest pain, chills, fever, myalgias, nausea, numbness, urinary symptoms, vomiting or weakness. Nothing aggravates the symptoms. She has tried nothing for the symptoms. The treatment provided no relief.  Vaginal Discharge Associated symptoms: abdominal pain (intermittent, currently resolved) and vaginal itching   Associated symptoms: no dysuria, no fever, no nausea and no vomiting     Past Medical History  Diagnosis Date  . Gonorrhea 08/2011    Treated in 09/2011  . Diabetes mellitus 2001    Diagnosed at age 59 ; Type I  . DKA (diabetic ketoacidoses) ([place]) 08/19/2013   Past Surgical History  Procedure Laterality Date  . No past surgeries    . Incision and drainage perirectal abscess Right 08/18/2013    Procedure: IRRIGATION AND DEBRIDEMENT GLUTEAL ABSCESS;  Surgeon: Ralene Ok, MD;  Location: [place];  Service: General;  Laterality: Right;  . Incision and drainage perirectal abscess Right 09/19/2013    Procedure: IRRIGATION AND DEBRIDEMENT RIGHT GLUTEAL AND LABIAL ABSCESSES;  Surgeon: Ralene Ok, MD;  Location: [place];  Service: General;  Laterality: Right;  . Incision and drainage perirectal abscess Right 09/24/2013    Procedure: IRRIGATION AND DEBRIDEMENT PERIRECTAL ABSCESS;  Surgeon: Gwenyth Ober, MD;  Location: [place];  Service: General;  Laterality: Right;   Family History  Problem Relation Age of Onset  . Anesthesia problems Neg Hx   . Asthma Mother   . Gout Father   . Diabetes Paternal Grandmother    Social History  Substance Use Topics  . Smoking status: Never Smoker   . Smokeless tobacco: Never Used  . Alcohol Use: Yes     Comment: occ  OB History    Gravida Para Term Preterm AB TAB SAB Ectopic Multiple Living   1 0   1  1   0     Review of Systems  Constitutional: Negative for fever and chills.  Respiratory: Negative for shortness of breath.   Cardiovascular: Negative for chest  pain.  Gastrointestinal: Positive for abdominal pain (intermittent, currently resolved). Negative for nausea, vomiting, diarrhea, constipation and blood in stool.  Genitourinary: Positive for vaginal discharge and vaginal pain (itching). Negative for dysuria, frequency, hematuria, vaginal bleeding and genital sores.  Musculoskeletal: Negative for myalgias and arthralgias.  Skin: Negative for color change.  Allergic/Immunologic: Positive for immunocompromised state (diabetic).  Neurological: Negative for weakness and numbness.  Psychiatric/Behavioral: Negative for confusion.   10 Systems reviewed and are negative for acute change except as noted in the HPI.    Allergies  Penicillins  Home Medications   Prior to Admission medications   Medication Sig Start Date End Date Taking? Authorizing Provider  acetaminophen (TYLENOL) 325 MG tablet Take 1 tablet (325 mg total) by mouth every 6 (six) hours as needed for mild pain or moderate pain. Patient not taking: Reported on 03/12/2015 07/27/14   Luan Moore, MD  cyclobenzaprine (FLEXERIL) 5 MG tablet Take 1 tablet (5 mg total) by mouth 3 (three) times daily as needed for muscle spasms. Patient not taking: Reported on 03/12/2015 01/05/15   Ashley Murrain, NP  famotidine (PEPCID) 20 MG tablet Take 1 tablet (20 mg total) by mouth 2 (two) times daily. Patient not taking: Reported on 03/12/2015 01/05/15   Ashley Murrain, NP  insulin aspart (NOVOLOG) 100 UNIT/ML injection Inject 5-10 Units into the skin 2 (two) times daily. Per sliding scale    Historical Provider, MD  insulin aspart (NOVOLOG) 100 UNIT/ML injection Inject 5-10 Units into the skin 2 (two) times daily with a meal. Per sliding scale 03/28/15   Elesha Thedford Camprubi-Soms, PA-C  insulin glargine (LANTUS) 100 UNIT/ML injection Inject 20 Units into the skin 2 (two) times daily with a meal.    Historical Provider, MD   BP 123/71 mmHg  Pulse 106  Temp(Src) 98.2 F (36.8 C) (Oral)  Resp 22  Ht 5\' 3"   (1.6 m)  Wt 50.803 kg  BMI 19.84 kg/m2  SpO2 100%  LMP 03/20/2015 (Approximate) Physical Exam  Constitutional: She is oriented to person, place, and time. Vital signs are normal. She appears well-developed and well-nourished.  Non-toxic appearance. No distress.  Afebrile, nontoxic, NAD  HENT:  Head: Normocephalic and atraumatic.  Mouth/Throat: Oropharynx is clear and moist. Mucous membranes are dry.  Mildly dry mucous membranes  Eyes: Conjunctivae and EOM are normal. Right eye exhibits no discharge. Left eye exhibits no discharge.  Neck: Normal range of motion. Neck supple.  Cardiovascular: Regular rhythm, normal heart sounds and intact distal pulses.  Tachycardia present.  Exam reveals no gallop and no friction rub.   No murmur heard. Mildly tachycardic in the low 100s  Pulmonary/Chest: Effort normal and breath sounds normal. No respiratory distress. She has no decreased breath sounds. She has no wheezes. She has no rhonchi. She has no rales.  Abdominal: Soft. Normal appearance and bowel sounds are normal. She exhibits no distension. There is no tenderness. There is no rigidity, no rebound, no guarding, no CVA tenderness, no tenderness at McBurney's point and negative Murphy's sign.  Soft, NTND, +BS throughout, no r/g/r, neg murphy's, neg mcburney's, no CVA TTP   Genitourinary: Uterus normal. Pelvic exam was performed with patient  supine. There is no rash, tenderness or lesion on the right labia. There is no rash, tenderness or lesion on the left labia. Cervix exhibits discharge and friability. Cervix exhibits no motion tenderness. Right adnexum displays no mass, no tenderness and no fullness. Left adnexum displays no mass, no tenderness and no fullness. There is erythema and tenderness in the vagina. Vaginal discharge found.  Chaperone present for exam. No rashes, lesions, or tenderness to external genitalia. Minimal erythema and tenderness to vaginal mucosa without injury to vaginal mucosa.  +White thick vaginal discharge without bleeding within vaginal vault. No adnexal masses, tenderness, or fullness. No CMT, +cervical friability and thick white discharge from cervical os. Uterus non-deviated, mobile, nonTTP, and without enlargement.    Musculoskeletal: Normal range of motion.  Neurological: She is alert and oriented to person, place, and time. She has normal strength. No sensory deficit.  A&O x4  Skin: Skin is warm, dry and intact. No rash noted.  Psychiatric: She has a normal mood and affect.  Nursing note and vitals reviewed.   ED Course  Procedures (including critical care time) Labs Review Labs Reviewed  WET PREP, GENITAL - Abnormal; Notable for the following:    Trich, Wet Prep PRESENT (*)    Clue Cells Wet Prep HPF POC PRESENT (*)    WBC, Wet Prep HPF POC MANY (*)    All other components within normal limits  BASIC METABOLIC PANEL - Abnormal; Notable for the following:    Sodium 123 (*)    Potassium 5.3 (*)    Chloride 90 (*)    Glucose, Bld 949 (*)    All other components within normal limits  URINALYSIS, ROUTINE W REFLEX MICROSCOPIC (NOT AT [facility]) - Abnormal; Notable for the following:    Color, Urine STRAW (*)    Glucose, UA >1000 (*)    Leukocytes, UA SMALL (*)    All other components within normal limits  URINE MICROSCOPIC-ADD ON - Abnormal; Notable for the following:    Squamous Epithelial / LPF 6-30 (*)    Bacteria, UA RARE (*)    All other components within normal limits  CBC WITH DIFFERENTIAL/PLATELET  RPR  HIV ANTIBODY (ROUTINE TESTING)  POC URINE PREG, ED  GC/CHLAMYDIA PROBE AMP ([facility]) NOT AT [facility]    Imaging Review No results found. I have personally reviewed and evaluated these images and lab results as part of my medical decision-making.   EKG Interpretation None      MDM   Final diagnoses:  Vaginal discharge  Yeast vaginitis  Trichomonas vaginitis  Hyperglycemia  Noncompliance with diabetes treatment  Hyponatremia   Hyperkalemia  Bacterial vaginosis    24 y.o. female here with vaginal itching and discharge x2 days, intermittent abd pain for 1 week which is currently resolved. No abd tenderness. Pelvic exam reveals thick white discharge and erythema of vaginal vault which is likely due to candidiasis. No CMT or pelvic tenderness. +Cervical friability and discharge. U/A with +trich. Will treat for GC/CT/trich and empirically for yeast. CBC w/diff unremarkable. BMP showing pseudohyponatremia down to 123 which corrects due to hyperglycemia. Also with pseudohyperkalemia due to hyperglycemia, K 5.3. Gluc 949. Pt mentating appropriately and refusing to be treated for hyperglycemia, states she'll take her novolog when she gets home. Had length discussion that this glucose is dangerously high and could cause multiple problems, she states she understands this but refuses treatment for this. No anion gap, no change in bicarb, no ketones in urine, doubt DKA or HHS.  Upreg neg. U/A without evidence of infection aside from trichomonas. Will await wet prep, will get remaining STD tests, and then likely d/c home with instructions to f/up with PCP in the morning.   12:00 AM Wet prep reveals +clue cells, will treat for BV. +Trich again. No yeast but given clinical symptoms, will treat here with one dose of diflucan as previously discussed. Will send home with flagyl 500mg  BID x7 days for BV. Discussed f/up in the morning with PCP for ongoing management of her symptoms and recheck of hyperglycemia/labs. Pt understands risks of untreated hyperglycemia, and wants no treatment. Pt stable at this time and will be leaving AMA since she is refusing tx for her other conditions. Pt stable at discharge.  BP 123/71 mmHg  Pulse 106  Temp(Src) 98.2 F (36.8 C) (Oral)  Resp 22  Ht 5\' 3"  (1.6 m)  Wt 50.803 kg  BMI 19.84 kg/m2  SpO2 100%  LMP 03/20/2015 (Approximate)  Meds ordered this encounter  Medications  . azithromycin (ZITHROMAX)  tablet 1,000 mg    Sig:    And  . cefTRIAXone (ROCEPHIN) injection 250 mg    Sig:     Order Specific Question:  Antibiotic Indication:    Answer:  STD   And  . metroNIDAZOLE (FLAGYL) tablet 2,000 mg    Sig:   . fluconazole (DIFLUCAN) tablet 150 mg    Sig:   . metroNIDAZOLE (FLAGYL) 500 MG tablet    Sig: Take 1 tablet (500 mg total) by mouth 2 (two) times daily. One po bid x 7 days    Dispense:  14 tablet    Refill:  0    Order Specific Question:  Supervising Provider    Answer:  Noemi Chapel [[id number]]      Ashden Sonnenberg Camprubi-Soms, PA-C 04/28/15 0003  Veryl Speak, MD 04/28/15 [phone]

## 2015-04-28 LAB — RPR: RPR Ser Ql: NONREACTIVE

## 2015-04-28 LAB — HIV ANTIBODY (ROUTINE TESTING W REFLEX): HIV Screen 4th Generation wRfx: NONREACTIVE

## 2015-04-28 MED ORDER — LIDOCAINE HCL (PF) 1 % IJ SOLN
INTRAMUSCULAR | Status: AC
  Administered 2015-04-28: 5 mL
  Filled 2015-04-28: qty 5

## 2015-04-28 NOTE — ED Notes (Addendum)
Patient verbalized understanding of discharge instructions and leaving against medical advice; patient aware of risks of her high blood sugar and states she will take her medicine when she gets home. Patient denies any further needs or questions at this time. VS stable. Patient ambulatory with steady gait.

## 2015-04-30 LAB — GC/CHLAMYDIA PROBE AMP (~~LOC~~) NOT AT ARMC
Chlamydia: NEGATIVE
Neisseria Gonorrhea: NEGATIVE

## 2015-05-03 ENCOUNTER — Encounter: Payer: Self-pay | Admitting: Internal Medicine

## 2015-05-22 ENCOUNTER — Encounter (HOSPITAL_COMMUNITY): Payer: Self-pay | Admitting: Emergency Medicine

## 2015-05-22 ENCOUNTER — Emergency Department (HOSPITAL_COMMUNITY)
Admission: EM | Admit: 2015-05-22 | Discharge: 2015-05-22 | Discharge status: Against Medical Advice | Payer: Medicaid Other | Attending: Emergency Medicine | Admitting: Emergency Medicine

## 2015-05-22 DIAGNOSIS — E109 Type 1 diabetes mellitus without complications: Secondary | ICD-10-CM | POA: Insufficient documentation

## 2015-05-22 DIAGNOSIS — Z8619 Personal history of other infectious and parasitic diseases: Secondary | ICD-10-CM | POA: Insufficient documentation

## 2015-05-22 DIAGNOSIS — Z880 Allergy status to penicillin: Secondary | ICD-10-CM | POA: Insufficient documentation

## 2015-05-22 DIAGNOSIS — Z794 Long term (current) use of insulin: Secondary | ICD-10-CM | POA: Insufficient documentation

## 2015-05-22 DIAGNOSIS — H1012 Acute atopic conjunctivitis, left eye: Secondary | ICD-10-CM | POA: Insufficient documentation

## 2015-05-22 MED ORDER — FLUORESCEIN SODIUM 1 MG OP STRP
1.0000 | ORAL_STRIP | Freq: Once | OPHTHALMIC | Status: AC
  Administered 2015-05-22: 1 via OPHTHALMIC
  Filled 2015-05-22: qty 1

## 2015-05-22 MED ORDER — TETRACAINE HCL 0.5 % OP SOLN
2.0000 [drp] | Freq: Once | OPHTHALMIC | Status: AC
  Administered 2015-05-22: 2 [drp] via OPHTHALMIC
  Filled 2015-05-22: qty 4

## 2015-05-22 MED ORDER — NAPHAZOLINE-PHENIRAMINE 0.025-0.3 % OP SOLN: 2.0000 [drp] | Freq: Four times a day (QID) | OPHTHALMIC | Status: DC | PRN

## 2015-05-22 NOTE — ED Notes (Addendum)
Lt eye pain since yesterday, red, painful, minimal amount of blurred vision. Denies any fevers, no drainage. No one else at home has this. Has not taken anything. Light makes it worse, nothing makes it better. Believes that it started after lying on someones pillow

## 2015-05-22 NOTE — ED Provider Notes (Signed)
CSN: [id number]     Arrival date & time 05/22/15  1146 History  By signing my name below, I, Ashley Freeman, attest that this documentation has been prepared under the direction and in the presence of Ashley Freeman, [place]. Electronically Signed: Hansel Freeman, ED Scribe. 05/22/2015. 12:18 PM.    Chief Complaint  Patient presents with  . Eye Pain   The history is provided by the patient. No language interpreter was used.    HPI Comments: Ashley Freeman is a 25 y.o. female with h/o DM who presents to the Emergency Department complaining of moderate left eye pain and itching onset yesterday with associated photophobia, slightly increased blurry vision from baseline. She also describes a slight foreign body sensation in the corner of her eye. Pt notes that her symptoms began after visiting a friends house and lying on a pillow. No other new soaps, lotions, detergents, foods, animals, plants, medications. Pt states she is supposed to have glasses, but has not gotten them due to a lapse in [place]. No contact use. States that she thinks her left eye vision is only minimally worse than baseline. She denies discharge, increased tearing, eyelid swelling, rash.   Past Medical History  Diagnosis Date  . Gonorrhea 08/2011    Treated in 09/2011  . Diabetes mellitus 2001    Diagnosed at age 18 ; Type I  . DKA (diabetic ketoacidoses) ([place]) 08/19/2013   Past Surgical History  Procedure Laterality Date  . No past surgeries    . Incision and drainage perirectal abscess Right 08/18/2013    Procedure: IRRIGATION AND DEBRIDEMENT GLUTEAL ABSCESS;  Surgeon: Ralene Ok, MD;  Location: [place];  Service: General;  Laterality: Right;  . Incision and drainage perirectal abscess Right 09/19/2013    Procedure: IRRIGATION AND DEBRIDEMENT RIGHT GLUTEAL AND LABIAL ABSCESSES;  Surgeon: Ralene Ok, MD;  Location: [place];  Service: General;  Laterality: Right;  . Incision and drainage perirectal abscess Right 09/24/2013    Procedure: IRRIGATION AND DEBRIDEMENT PERIRECTAL ABSCESS;  Surgeon: Gwenyth Ober, MD;  Location: [place];  Service: General;  Laterality: Right;   Family History  Problem Relation Age of Onset  . Anesthesia problems Neg Hx   . Asthma Mother   . Gout Father   . Diabetes Paternal Grandmother    Social History  Substance Use Topics  . Smoking status: Never Smoker   . Smokeless tobacco: Never Used  . Alcohol Use: Yes     Comment: occ   OB History    Gravida Para Term Preterm AB TAB SAB Ectopic Multiple Living   1 0   1  1   0     Review of Systems  Eyes: Positive for photophobia, pain, itching and visual disturbance. Negative for discharge.  Skin: Negative for rash.  All other systems reviewed and are negative.  Allergies  Penicillins  Home Medications   Prior to Admission medications   Medication Sig Start Date End Date Taking? Authorizing Provider  acetaminophen (TYLENOL) 325 MG tablet Take 1 tablet (325 mg total) by mouth every 6 (six) hours as needed for mild pain or moderate pain. Patient not taking: Reported on 03/12/2015 07/27/14   Ashley Moore, MD  cyclobenzaprine (FLEXERIL) 5 MG tablet Take 1 tablet (5 mg total) by mouth 3 (three) times daily as needed for muscle spasms. Patient not taking: Reported on 03/12/2015 01/05/15   Ashley Murrain, NP  famotidine (PEPCID) 20 MG tablet Take 1 tablet (20 mg total) by  mouth 2 (two) times daily. Patient not taking: Reported on 03/12/2015 01/05/15   Ashley Murrain, NP  insulin aspart (NOVOLOG) 100 UNIT/ML injection Inject 5-10 Units into the skin 2 (two) times daily. Per sliding scale    Historical Provider, MD  insulin aspart (NOVOLOG) 100 UNIT/ML injection Inject 5-10 Units into the skin 2 (two) times daily with a meal. Per sliding scale 03/28/15   Ashley Camprubi-Soms, PA-C  insulin glargine (LANTUS) 100 UNIT/ML injection Inject 20 Units into the skin 2 (two) times daily with a meal.    Historical Provider, MD  metroNIDAZOLE (FLAGYL) 500  MG tablet Take 1 tablet (500 mg total) by mouth 2 (two) times daily. One po bid x 7 days 04/27/15   Ashley Camprubi-Soms, PA-C   BP 103/69 mmHg  Pulse 79  Temp(Src) 98.4 F (36.9 C) (Oral)  Resp 20  SpO2 99%  LMP 03/20/2015 (Approximate) Physical Exam  Constitutional: She is oriented to person, place, and time. She appears well-developed and well-nourished.  HENT:  Head: Normocephalic and atraumatic.  Eyes: EOM are normal. Pupils are equal, round, and reactive to light. Right eye exhibits no discharge. Left eye exhibits no discharge.  Left eye conjunctiva mildly injected with mild tearing. No foreign bodies visualized. EOM intact. External eyelid mildly edematous. No fluorescein uptake on exam. No dendritic lesions, corneal abrasion, or corneal ulcer. No hyphema or hypopyon. No periorbital edema or erythema.    Visual Acuity  Right Eye Distance: 20/200 Left Eye Distance: 20/200 Bilateral Distance: 20/100  Neck: Normal range of motion. Neck supple.  Cardiovascular: Normal rate.   Pulmonary/Chest: Effort normal. No respiratory distress.  Abdominal: She exhibits no distension.  Musculoskeletal: Normal range of motion.  Neurological: She is alert and oriented to person, place, and time.  Skin: Skin is warm and dry.  Psychiatric: She has a normal mood and affect. Her behavior is normal.  Nursing note and vitals reviewed.   ED Course  Procedures (including critical care time) DIAGNOSTIC STUDIES: Oxygen Saturation is 99% on RA, normal by my interpretation.    COORDINATION OF CARE: 12:07 PM Discussed treatment plan with pt at bedside and pt agreed to plan.   MDM   Final diagnoses:  Allergic conjunctivitis, left    Pt with left eye pain, itching, and conjunctival injection since yesterday after sleeping at a friend's house. She has no periorbital erythema or edema, no pain with EOM to suggest periorbital cellulitis. PERRL with no marked change in visual acuity from baseline,  no lesions on wood's lamp exam. Low suspicion for optic neuritis, infectious keratitis, or iritis. Rx given for naphcon-A as I suspect allergic/irritant etiology. Ophtho info given for f/u. ER return precautions given. Pt verbalized understanding.   I personally performed the services described in this documentation, which was scribed in my presence. The recorded information has been reviewed and is accurate.   Anne Ng, PA-C 05/22/15 1249  As pt being discharged, found to have HR of 130 on vitals re-check. Pt states her heart rate jumps up when she is anxious and she had coca cola this AM. States she is also irritated at this time. She denies chest pain, SOB, feeling faint or dizzy. She states she wants to go home. RN and I discussed with pt that if she leaves at this time I will now consider it AMA as I cannot safely discharge her home with such degree of tachycardia. I discussed risks of leaving now including deterioration or death. Offered fluids and re-assessment.  Pt declines. She wants to leave AMA.   Anne Ng, PA-C 05/22/15 [place], MD 05/22/15 [phone]

## 2015-05-22 NOTE — Discharge Instructions (Signed)
Your eye symptoms are likely due to an allergic reaction. I will give you a prescription for eye drops that should help your symptoms. If your symptoms do not improve or get worse return to the ER or follow up with opthalamology.    Allergic Conjunctivitis Allergic conjunctivitis is inflammation of the clear membrane that covers the white part of your eye and the inner surface of your eyelid (conjunctiva), and it is caused by allergies. The blood vessels in the conjunctiva become inflamed, and this causes the eye to become red or pink, and it often causes itchiness in the eye. Allergic conjunctivitis cannot be spread by one person to another person (noncontagious). CAUSES This condition is caused by an allergic reaction. Common causes of an allergic reaction (allergens) include:  Dust.  Pollen.  Mold.  Animal dander or secretions. RISK FACTORS This condition is more likely to develop if you are exposed to high levels of allergens that cause the allergic reaction. This might include being outdoors when air pollen levels are high or being around animals that you are allergic to. SYMPTOMS Symptoms of this condition may include:  Eye redness.  Tearing of the eyes.  Watery eyes.  Itchy eyes.  Burning feeling in the eyes.  Clear drainage from the eyes.  Swollen eyelids. DIAGNOSIS This condition may be diagnosed by medical history and physical exam. If you have drainage from your eyes, it may be tested to rule out other causes of conjunctivitis. TREATMENT Treatment for this condition often includes medicines. These may be eye drops, ointments, or oral medicines. They may be prescription medicines or over-the-counter medicines. HOME CARE INSTRUCTIONS  Take or apply medicines only as directed by your health care provider.  Do not touch or rub your eyes.  Do not wear contact lenses until the inflammation is gone. Wear glasses instead.  Do not wear eye makeup until the inflammation  is gone.  Apply a cool, clean washcloth to your eye for 10-20 minutes, 3-4 times a day.  Try to avoid whatever allergen is causing the allergic reaction. SEEK MEDICAL CARE IF:  Your symptoms get worse.  You have pus draining from your eye.  You have new symptoms.  You have a fever.   This information is not intended to replace advice given to you by your health care provider. Make sure you discuss any questions you have with your health care provider.   Document Released: 07/07/2002 Document Revised: 05/07/2014 Document Reviewed: 01/26/2014 Elsevier Interactive Patient Education [employer].

## 2015-06-20 ENCOUNTER — Emergency Department (HOSPITAL_COMMUNITY)
Admission: EM | Admit: 2015-06-20 | Discharge: 2015-06-21 | Discharge status: Against Medical Advice | Payer: Medicaid Other | Attending: Emergency Medicine | Admitting: Emergency Medicine

## 2015-06-20 ENCOUNTER — Encounter (HOSPITAL_COMMUNITY): Payer: Self-pay | Admitting: *Deleted

## 2015-06-20 DIAGNOSIS — E1065 Type 1 diabetes mellitus with hyperglycemia: Secondary | ICD-10-CM | POA: Insufficient documentation

## 2015-06-20 DIAGNOSIS — Z794 Long term (current) use of insulin: Secondary | ICD-10-CM | POA: Insufficient documentation

## 2015-06-20 DIAGNOSIS — R739 Hyperglycemia, unspecified: Secondary | ICD-10-CM

## 2015-06-20 DIAGNOSIS — Z8619 Personal history of other infectious and parasitic diseases: Secondary | ICD-10-CM | POA: Insufficient documentation

## 2015-06-20 DIAGNOSIS — Z880 Allergy status to penicillin: Secondary | ICD-10-CM | POA: Insufficient documentation

## 2015-06-20 DIAGNOSIS — Z792 Long term (current) use of antibiotics: Secondary | ICD-10-CM | POA: Insufficient documentation

## 2015-06-20 DIAGNOSIS — D849 Immunodeficiency, unspecified: Secondary | ICD-10-CM | POA: Insufficient documentation

## 2015-06-20 DIAGNOSIS — L02214 Cutaneous abscess of groin: Secondary | ICD-10-CM | POA: Insufficient documentation

## 2015-06-20 LAB — BASIC METABOLIC PANEL
Anion gap: 12 (ref 5–15)
BUN: 12 mg/dL (ref 6–20)
CALCIUM: 9.5 mg/dL (ref 8.9–10.3)
CO2: 21 mmol/L — ABNORMAL LOW (ref 22–32)
CREATININE: 0.95 mg/dL (ref 0.44–1.00)
Chloride: 96 mmol/L — ABNORMAL LOW (ref 101–111)
GFR calc Af Amer: >60 mL/min (ref >60)
GFR calc non Af Amer: >60 mL/min (ref >60)
GLUCOSE: 650 mg/dL — AB (ref 65–99)
Potassium: 5.3 mmol/L — ABNORMAL HIGH (ref 3.5–5.1)
Sodium: 129 mmol/L — ABNORMAL LOW (ref 135–145)

## 2015-06-20 LAB — I-STAT CHEM 8, ED
BUN: 15 mg/dL (ref 6–20)
CALCIUM ION: 1.17 mmol/L (ref 1.12–1.23)
CHLORIDE: 90 mmol/L — AB (ref 101–111)
CREATININE: 0.8 mg/dL (ref 0.44–1.00)
Glucose, Bld: >700 mg/dL (ref 65–99)
HCT: 45 % (ref 36.0–46.0)
Hemoglobin: 15.3 g/dL — ABNORMAL HIGH (ref 12.0–15.0)
Potassium: 4.2 mmol/L (ref 3.5–5.1)
Sodium: 130 mmol/L — ABNORMAL LOW (ref 135–145)
TCO2: 27 mmol/L (ref 0–100)

## 2015-06-20 LAB — CBG MONITORING, ED: Glucose-Capillary: 416 mg/dL — ABNORMAL HIGH (ref 65–99)

## 2015-06-20 LAB — CBC WITH DIFFERENTIAL/PLATELET
BASOS PCT: 0 %
Basophils Absolute: 0 10*3/uL (ref 0.0–0.1)
Eosinophils Absolute: 0.3 10*3/uL (ref 0.0–0.7)
Eosinophils Relative: 2 %
HEMATOCRIT: 39.6 % (ref 36.0–46.0)
HEMOGLOBIN: 13.6 g/dL (ref 12.0–15.0)
Lymphocytes Relative: 25 %
Lymphs Abs: 2.9 10*3/uL (ref 0.7–4.0)
MCH: 28.3 pg (ref 26.0–34.0)
MCHC: 34.3 g/dL (ref 30.0–36.0)
MCV: 82.5 fL (ref 78.0–100.0)
MONO ABS: 0.9 10*3/uL (ref 0.1–1.0)
MONOS PCT: 8 %
NEUTROS ABS: 7.5 10*3/uL (ref 1.7–7.7)
Neutrophils Relative %: 65 %
Platelets: 211 10*3/uL (ref 150–400)
RBC: 4.8 MIL/uL (ref 3.87–5.11)
RDW: 12.8 % (ref 11.5–15.5)
WBC: 11.5 10*3/uL — ABNORMAL HIGH (ref 4.0–10.5)

## 2015-06-20 MED ORDER — SODIUM CHLORIDE 0.9 % IV BOLUS (SEPSIS)
1000.0000 mL | Freq: Once | INTRAVENOUS | Status: AC
  Administered 2015-06-20: 1000 mL via INTRAVENOUS

## 2015-06-20 MED ORDER — INSULIN GLARGINE 100 UNIT/ML ~~LOC~~ SOLN: 20.0000 [IU] | Freq: Two times a day (BID) | SUBCUTANEOUS | Status: DC

## 2015-06-20 MED ORDER — LIDOCAINE-EPINEPHRINE (PF) 2 %-1:200000 IJ SOLN
10.0000 mL | Freq: Once | INTRAMUSCULAR | Status: AC
  Administered 2015-06-20: 10 mL

## 2015-06-20 MED ORDER — INSULIN ASPART 100 UNIT/ML ~~LOC~~ SOLN
10.0000 [IU] | Freq: Once | SUBCUTANEOUS | Status: AC
  Administered 2015-06-20: 10 [IU] via SUBCUTANEOUS
  Filled 2015-06-20: qty 1

## 2015-06-20 MED ORDER — SULFAMETHOXAZOLE-TRIMETHOPRIM 800-160 MG PO TABS: 1.0000 | ORAL_TABLET | Freq: Two times a day (BID) | ORAL | Status: AC

## 2015-06-20 MED ORDER — INSULIN ASPART 100 UNIT/ML ~~LOC~~ SOLN: 5.0000 [IU] | Freq: Two times a day (BID) | SUBCUTANEOUS | Status: DC

## 2015-06-20 MED ORDER — INSULIN SYRINGES (DISPOSABLE) U-100 0.3 ML MISC: 1.0000 | Freq: Three times a day (TID) | Status: DC

## 2015-06-20 MED ORDER — SODIUM CHLORIDE 0.9 % IV BOLUS (SEPSIS): 1000.0000 mL | Freq: Once | INTRAVENOUS | Status: DC

## 2015-06-20 NOTE — Discharge Instructions (Signed)
Read the information below.  Use the prescribed medication as directed.  Please discuss all new medications with your pharmacist.  You may return to the Emergency Department at any time for worsening condition or any new symptoms that concern you.  If you develop increased redness, swelling, pus draining from the wound, or fevers greater than 100.4, return to the ER immediately for a recheck.      Abscess An abscess is an infected area that contains a collection of pus and debris.It can occur in almost any part of the body. An abscess is also known as a furuncle or boil. CAUSES  An abscess occurs when tissue gets infected. This can occur from blockage of oil or sweat glands, infection of hair follicles, or a minor injury to the skin. As the body tries to fight the infection, pus collects in the area and creates pressure under the skin. This pressure causes pain. People with weakened immune systems have difficulty fighting infections and get certain abscesses more often.  SYMPTOMS Usually an abscess develops on the skin and becomes a painful mass that is red, warm, and tender. If the abscess forms under the skin, you may feel a moveable soft area under the skin. Some abscesses break open (rupture) on their own, but most will continue to get worse without care. The infection can spread deeper into the body and eventually into the bloodstream, causing you to feel ill.  DIAGNOSIS  Your caregiver will take your medical history and perform a physical exam. A sample of fluid may also be taken from the abscess to determine what is causing your infection. TREATMENT  Your caregiver may prescribe antibiotic medicines to fight the infection. However, taking antibiotics alone usually does not cure an abscess. Your caregiver may need to make a small cut (incision) in the abscess to drain the pus. In some cases, gauze is packed into the abscess to reduce pain and to continue draining the area. HOME CARE INSTRUCTIONS    Only take over-the-counter or prescription medicines for pain, discomfort, or fever as directed by your caregiver.  If you were prescribed antibiotics, take them as directed. Finish them even if you start to feel better.  If gauze is used, follow your caregiver's directions for changing the gauze.  To avoid spreading the infection:  Keep your draining abscess covered with a bandage.  Wash your hands well.  Do not share personal care items, towels, or whirlpools with others.  Avoid skin contact with others.  Keep your skin and clothes clean around the abscess.  Keep all follow-up appointments as directed by your caregiver. SEEK MEDICAL CARE IF:   You have increased pain, swelling, redness, fluid drainage, or bleeding.  You have muscle aches, chills, or a general ill feeling.  You have a fever. MAKE SURE YOU:   Understand these instructions.  Will watch your condition.  Will get help right away if you are not doing well or get worse.   This information is not intended to replace advice given to you by your health care provider. Make sure you discuss any questions you have with your health care provider.   Document Released: 01/24/2005 Document Revised: 10/16/2011 Document Reviewed: 06/29/2011 Elsevier Interactive Patient Education 2016 [place].  Hyperglycemia Hyperglycemia occurs when the glucose (sugar) in your blood is too high. Hyperglycemia can happen for many reasons, but it most often happens to people who do not know they have diabetes or are not managing their diabetes properly.  CAUSES  Whether you have diabetes or not, there are other causes of hyperglycemia. Hyperglycemia can occur when you have diabetes, but it can also occur in other situations that you might not be as aware of, such as: Diabetes  If you have diabetes and are having problems controlling your blood glucose, hyperglycemia could occur because of some of the following reasons:  Not  following your meal plan.  Not taking your diabetes medications or not taking it properly.  Exercising less or doing less activity than you normally do.  Being sick. Pre-diabetes  This cannot be ignored. Before people develop Type 2 diabetes, they almost always have "pre-diabetes." This is when your blood glucose levels are higher than normal, but not yet high enough to be diagnosed as diabetes. Research has shown that some long-term damage to the body, especially the heart and circulatory system, may already be occurring during pre-diabetes. If you take action to manage your blood glucose when you have pre-diabetes, you may delay or prevent Type 2 diabetes from developing. Stress  If you have diabetes, you may be "diet" controlled or on oral medications or insulin to control your diabetes. However, you may find that your blood glucose is higher than usual in the hospital whether you have diabetes or not. This is often referred to as "stress hyperglycemia." Stress can elevate your blood glucose. This happens because of hormones put out by the body during times of stress. If stress has been the cause of your high blood glucose, it can be followed regularly by your caregiver. That way he/she can make sure your hyperglycemia does not continue to get worse or progress to diabetes. Steroids  Steroids are medications that act on the infection fighting system (immune system) to block inflammation or infection. One side effect can be a rise in blood glucose. Most people can produce enough extra insulin to allow for this rise, but for those who cannot, steroids make blood glucose levels go even higher. It is not unusual for steroid treatments to "uncover" diabetes that is developing. It is not always possible to determine if the hyperglycemia will go away after the steroids are stopped. A special blood test called an A1c is sometimes done to determine if your blood glucose was elevated before the steroids were  started. SYMPTOMS  Thirsty.  Frequent urination.  Dry mouth.  Blurred vision.  Tired or fatigue.  Weakness.  Sleepy.  Tingling in feet or leg. DIAGNOSIS  Diagnosis is made by monitoring blood glucose in one or all of the following ways:  A1c test. This is a chemical found in your blood.  Fingerstick blood glucose monitoring.  Laboratory results. TREATMENT  First, knowing the cause of the hyperglycemia is important before the hyperglycemia can be treated. Treatment may include, but is not be limited to:  Education.  Change or adjustment in medications.  Change or adjustment in meal plan.  Treatment for an illness, infection, etc.  More frequent blood glucose monitoring.  Change in exercise plan.  Decreasing or stopping steroids.  Lifestyle changes. HOME CARE INSTRUCTIONS   Test your blood glucose as directed.  Exercise regularly. Your caregiver will give you instructions about exercise. Pre-diabetes or diabetes which comes on with stress is helped by exercising.  Eat wholesome, balanced meals. Eat often and at regular, fixed times. Your caregiver or nutritionist will give you a meal plan to guide your sugar intake.  Being at an ideal weight is important. If needed, losing as little as 10 to 15 pounds  may help improve blood glucose levels. SEEK MEDICAL CARE IF:   You have questions about medicine, activity, or diet.  You continue to have symptoms (problems such as increased thirst, urination, or weight gain). SEEK IMMEDIATE MEDICAL CARE IF:   You are vomiting or have diarrhea.  Your breath smells fruity.  You are breathing faster or slower.  You are very sleepy or incoherent.  You have numbness, tingling, or pain in your feet or hands.  You have chest pain.  Your symptoms get worse even though you have been following your caregiver's orders.  If you have any other questions or concerns.   This information is not intended to replace advice  given to you by your health care provider. Make sure you discuss any questions you have with your health care provider.   Document Released: 10/10/2000 Document Revised: 07/09/2011 Document Reviewed: 12/21/2014 Elsevier Interactive Patient Education [employer].

## 2015-06-20 NOTE — Progress Notes (Signed)
EDCM spoke to [place] regarding patient need for follow up and medication cost assistance.  Patient noted to have been seen in the ED 8 times within the last six months.  Patient listed without insurance or pcp.  EDPA reports patient is strtching her insulin.  Patient noted to be on novalog insulin and Lantus.  Patient to be discharged with rx for Bactrim.  EDCM will be able to place patient in [facility] program for novalog insulin.  EDPA to write patient rx for syringes.  EDCM will send message to [facility] CM in attempts to obtain an appointment.  Patient would also benefit from speaking to a financial counselor at the clinic.  [facility] faxed [place] letter, community resources and contact information to [facility] to [facility] ED [phone] with confirmation of receipt.  No further EDCM needs at this time.

## 2015-06-20 NOTE — ED Notes (Signed)
Pt in c/o abscess to her right thigh, states it opened up today and started draining, history of same

## 2015-06-20 NOTE — ED Notes (Signed)
Called for triage, no answer in lobby.

## 2015-06-20 NOTE — ED Provider Notes (Signed)
CSN: [id number]     Arrival date & time 06/20/15  1829 History  By signing my name below, I, [name], attest that this documentation has been prepared under the direction and in the presence of non-physician practitioner, Clayton Bibles, PA-C. Electronically Signed: [name], Scribe. 06/20/2015. 7:29 PM.    Chief Complaint  Patient presents with  . Abscess    The history is provided by the patient. No language interpreter was used.     HPI Comments: Ashley Freeman is a 25 y.o. female with h/o uncontrolled type 1 DM who presents to the Emergency Department complaining of right inner thigh abscess that she noticed last night. Pt reports that she went to the bathroom last night and noticed a large abscess on her right inner thigh; she reports associated bleeding and pus-like drainage. Pt states that she had an incision and drainage performed last year for an abscess in the same spot.  She denies fever, chills, body aches, abdominal pain, vomiting, weakness/numbness in her right leg, or any other associated symptoms at this time. Pt states her blood sugar today was in the 200s.  Denies any polydipsia, polyuria.     Past Medical History  Diagnosis Date  . Gonorrhea 08/2011    Treated in 09/2011  . Diabetes mellitus 2001    Diagnosed at age 51 ; Type I  . DKA (diabetic ketoacidoses) ([place]) 08/19/2013   Past Surgical History  Procedure Laterality Date  . No past surgeries    . Incision and drainage perirectal abscess Right 08/18/2013    Procedure: IRRIGATION AND DEBRIDEMENT GLUTEAL ABSCESS;  Surgeon: Ralene Ok, MD;  Location: [place];  Service: General;  Laterality: Right;  . Incision and drainage perirectal abscess Right 09/19/2013    Procedure: IRRIGATION AND DEBRIDEMENT RIGHT GLUTEAL AND LABIAL ABSCESSES;  Surgeon: Ralene Ok, MD;  Location: [place];  Service: General;  Laterality: Right;  . Incision and drainage perirectal abscess Right 09/24/2013    Procedure: IRRIGATION  AND DEBRIDEMENT PERIRECTAL ABSCESS;  Surgeon: Gwenyth Ober, MD;  Location: [place];  Service: General;  Laterality: Right;   Family History  Problem Relation Age of Onset  . Anesthesia problems Neg Hx   . Asthma Mother   . Gout Father   . Diabetes Paternal Grandmother    Social History  Substance Use Topics  . Smoking status: Never Smoker   . Smokeless tobacco: Never Used  . Alcohol Use: Yes     Comment: occ   OB History    Gravida Para Term Preterm AB TAB SAB Ectopic Multiple Living   1 0   1  1   0     Review of Systems  Constitutional: Negative for fever and chills.  Gastrointestinal: Negative for vomiting.  Musculoskeletal: Negative for myalgias.  Skin: Positive for color change.  Allergic/Immunologic: Positive for immunocompromised state.  Neurological: Negative for weakness and numbness.  All other systems reviewed and are negative.     Allergies  Penicillins  Home Medications   Prior to Admission medications   Medication Sig Start Date End Date Taking? Authorizing Provider  acetaminophen (TYLENOL) 325 MG tablet Take 1 tablet (325 mg total) by mouth every 6 (six) hours as needed for mild pain or moderate pain. Patient not taking: Reported on 03/12/2015 07/27/14   Luan Moore, MD  cyclobenzaprine (FLEXERIL) 5 MG tablet Take 1 tablet (5 mg total) by mouth 3 (three) times daily as needed for muscle spasms. Patient not taking: Reported on 03/12/2015  01/05/15   Hope Bunnie Pion, NP  famotidine (PEPCID) 20 MG tablet Take 1 tablet (20 mg total) by mouth 2 (two) times daily. Patient not taking: Reported on 03/12/2015 01/05/15   Ashley Murrain, NP  insulin aspart (NOVOLOG) 100 UNIT/ML injection Inject 5-10 Units into the skin 2 (two) times daily. Per sliding scale    Historical Provider, MD  insulin aspart (NOVOLOG) 100 UNIT/ML injection Inject 5-10 Units into the skin 2 (two) times daily with a meal. Per sliding scale 03/28/15   Mercedes Camprubi-Soms, PA-C  insulin glargine  (LANTUS) 100 UNIT/ML injection Inject 20 Units into the skin 2 (two) times daily with a meal.    Historical Provider, MD  metroNIDAZOLE (FLAGYL) 500 MG tablet Take 1 tablet (500 mg total) by mouth 2 (two) times daily. One po bid x 7 days 04/27/15   Mercedes Camprubi-Soms, PA-C  naphazoline-pheniramine (NAPHCON-A) 0.025-0.3 % ophthalmic solution Place 2 drops into the left eye 4 (four) times daily as needed for irritation. 05/22/15   Olivia Canter Sam, PA-C   BP 124/81 mmHg  Pulse 104  Temp(Src) 98.3 F (36.8 C) (Oral)  Resp 20  SpO2 100%  LMP 06/09/2015 Physical Exam  Constitutional: She appears well-developed and well-nourished. No distress.  HENT:  Head: Normocephalic and atraumatic.  Neck: Neck supple.  Cardiovascular: Normal rate and regular rhythm.   Pulmonary/Chest: Effort normal and breath sounds normal. No respiratory distress. She has no wheezes. She has no rales.  Abdominal: Soft. She exhibits no distension. There is no tenderness. There is no rebound and no guarding.  Genitourinary:  Right inguinal crease with area of fluctuance Overlying erythema Small opening with active drainage of thin cloudy fluid Tender to palpation  Neurological: She is alert. She exhibits normal muscle tone.  Skin: She is not diaphoretic.  Nursing note and vitals reviewed.   ED Course  Procedures (including critical care time)  DIAGNOSTIC STUDIES: Oxygen Saturation is 100% on RA, normal by my interpretation.    COORDINATION OF CARE: 7:29 PM Will re-check vital signs. Discussed treatment plan with pt at bedside and pt agreed to plan.   Labs Review Labs Reviewed - No data to display  Imaging Review No results found. I have personally reviewed and evaluated these images and lab results as part of my medical decision-making.   EKG Interpretation None       INCISION AND DRAINAGE   Performed by: Clayton Bibles Consent: Verbal consent obtained. Risks and benefits: risks, benefits and  alternatives were discussed Type: abscess  Body area: right inguinal crease  No incision made.  Abscess readily draining from opening approximately 1cm in length.  Local anesthetic: NONE  Drainage: purulent  Drainage amount: large  Packing material: none  Flushed with normal saline.   Patient tolerance: Patient tolerated the procedure well with no immediate complications.     8:43 PM Patient admits she has been stretching out her insulin as she is running out.     MDM   Final diagnoses:  Hyperglycemia  Abscess of right groin    Afebrile nontoxic patient with hx type 1 diabetes that has historically been poorly controlled with typical extreme elevation in the ED (>600).  Does have hx DKA.  Has recurrent inguinal abscesses and presents with draining right inguinal abscess today.  Slight erythema overlying.  Given small opening of drainage, I recommended extending the opening to fully drain the fluctuant abscess.  She was initially very resistant to the idea of blood work, IVF, our  checking her blood sugar at all but after discussion agreed to plan.  Chem 8 reveals glucose >700 with gap 17.   IVF given with improvement.  Pt agreed to further blood work and IVF, case manager called for medication needs.  Upon setting up for I&D patient requested that I not place anesthetic nor incise the are but rather get out what I could through the opening that was already draining.  I was able to fully drain the area through the opening and flushed it well.  Labs demonstrated mild leukocytosis, likely due to inguinal abscess. Pt denies other sick symptoms.  Hyperglycemia, glucose 650, but anion gap of 12.  No DKA.  Pt reports feeling well.  She declined admission to the hospital for treatment of hyperglycemia but agreed to stay in ED for further treatment.  Pt signed out at change of shift to [place], PA-C, pending continued monitoring and treatment of hyperglycemia.  Anticipate d/c home with  refill of her home insulin ([employer] letter and resources provided to patient via fax by case manager Amy Christen Bame) and bactrim.  Recheck in ED or with PCP in two days, return for worsening symptom.     I personally performed the services described in this documentation, which was scribed in my presence. The recorded information has been reviewed and is accurate.      Clayton Bibles, PA-C 06/20/15 [place], PA-C 06/20/15 2313  Daleen Bo, MD 06/21/15 [phone]

## 2015-06-20 NOTE — Care Management Note (Signed)
Case Management Note  Patient Details  Name: Ashley Freeman MRN: [id number] Date of Birth: 07-Jan-1991  Subjective/Objective:   Patient presents to the Ed with abscess.  Multiple ED visits for elevated blood sugar and abscesses.                 Action/Plan: Place referral to [facility] to obtain appointment.  [facility] for assistance in cost of novolog insulin.   Expected Discharge Date:   06/20/2015               Expected Discharge Plan:  Home/Self Care  In-House Referral:     Discharge planning Services  CM Consult, Medication Assistance, [place], [facility]  Post Acute Care Choice:    Choice offered to:     DME Arranged:    DME Agency:     HH Arranged:    HH Agency:     Status of Service:  Completed, signed off  Medicare Important Message Given:    Date Medicare IM Given:    Medicare IM give by:    Date Additional Medicare IM Given:    Additional Medicare Important Message give by:     If discussed at [place] of Stay Meetings, dates discussed:    Additional CommentsLivia Snellen, RN 06/20/2015, 9:45 PM

## 2015-06-21 ENCOUNTER — Telehealth: Payer: Self-pay

## 2015-06-21 NOTE — ED Provider Notes (Signed)
Pt signed out to me at shift change. Initial blood sugar was >700. Pt given IV fluids and insulin. No sign of DKA. Pt refusing admission. I rechecked blood sugar, now 416. Plan to give additional liter of IV fluids and recheck BS.   Pt refusing to stay for additional treatment. Pt signed out AMA. Pt given prescription for home insulin and antibiotic for abscess. Pt expresses understanding of risks of discharge and elevated blood sugar.   Dondra Spry [place], PA-C [id number]  Delora Fuel, MD [id number]

## 2015-06-21 NOTE — ED Notes (Signed)
Upon entering room pt refusing to have further IV fluids, states she needs to leave now or she wont have a ride home, encouraged to stay and receive more fluids, pt refused and requested IVs to be removed.

## 2015-06-21 NOTE — Telephone Encounter (Signed)
Message received from Livia Snellen, RN CM requesting a hospital follow up appointment for the patient. This CM spoke to the patient earlier in the day and she said that she was " feeling better" and was  Interested in scheduling a hospital follow up appointment.  As per, [facility] scheduler there were no appointments available. Informed the patient that this CM would call her back if an appointment opened up later in the day. The patient was appreciative of the call.  She said that she still needed to have her prescriptions filled.   An appointment became available and the patient has been  scheduled to see Dr Jarold Song at [facility] on 07/06/15 @ 0930. Attempted to contact the patient to inform her of the appointment. Call placed to [phone] (H) and a HIPAA compliant voice mail message was left requesting a call back to # [phone] or [phone].  Update provided to A. Christen Bame, RN CM

## 2015-06-22 ENCOUNTER — Telehealth: Payer: Self-pay

## 2015-06-22 NOTE — Telephone Encounter (Signed)
Attempted to contact the patient to inform her of the scheduled appointment, 07/06/15 @ 0930. Call placed to [phone] (H) and a HIPAA compliant voice mail message was left requesting a call back to # [phone] or [phone].

## 2015-06-23 ENCOUNTER — Telehealth: Payer: Self-pay

## 2015-06-23 NOTE — Telephone Encounter (Signed)
This Case Manager placed call to patient to inform her of scheduled appointment on 07/06/15 at 0930 with Dr. Jarold Song. Patient appreciative of call and she indicated she would be at appointment. No additional needs identified.

## 2015-07-06 ENCOUNTER — Inpatient Hospital Stay: Payer: Medicaid Other | Admitting: Family Medicine

## 2015-07-21 ENCOUNTER — Inpatient Hospital Stay: Payer: Medicaid Other

## 2015-07-30 ENCOUNTER — Emergency Department (HOSPITAL_COMMUNITY)
Admission: EM | Admit: 2015-07-30 | Discharge: 2015-07-30 | Disposition: A | Discharge status: Home / Self Care | Payer: Medicaid Other | Attending: Emergency Medicine | Admitting: Emergency Medicine

## 2015-07-30 ENCOUNTER — Encounter (HOSPITAL_COMMUNITY): Payer: Self-pay | Admitting: Physical Medicine and Rehabilitation

## 2015-07-30 DIAGNOSIS — E119 Type 2 diabetes mellitus without complications: Secondary | ICD-10-CM | POA: Insufficient documentation

## 2015-07-30 DIAGNOSIS — Z8619 Personal history of other infectious and parasitic diseases: Secondary | ICD-10-CM | POA: Insufficient documentation

## 2015-07-30 DIAGNOSIS — R21 Rash and other nonspecific skin eruption: Secondary | ICD-10-CM

## 2015-07-30 DIAGNOSIS — Z794 Long term (current) use of insulin: Secondary | ICD-10-CM | POA: Insufficient documentation

## 2015-07-30 DIAGNOSIS — Z880 Allergy status to penicillin: Secondary | ICD-10-CM | POA: Insufficient documentation

## 2015-07-30 MED ORDER — HYDROXYZINE HCL 25 MG PO TABS: 25.0000 mg | ORAL_TABLET | Freq: Four times a day (QID) | ORAL | Status: DC

## 2015-07-30 MED ORDER — HYDROCORTISONE 1 % EX CREA: TOPICAL_CREAM | CUTANEOUS | Status: DC

## 2015-07-30 NOTE — ED Provider Notes (Signed)
CSN: [id number]     Arrival date & time 07/30/15  0808 History  By signing my name below, I, Rohini Rajnarayanan, attest that this documentation has been prepared under the direction and in the presence of Donnald Garre PA-C Electronically Signed: Evonnie Dawes, ED Scribe 07/30/2015 at 9:12 AM.  Chief Complaint  Patient presents with  . Rash   The history is provided by the patient. No language interpreter was used.    HPI Comments: Ashley Freeman is a 25 y.o. female with a pmhx of  DM, who presents to the Emergency Department complaining of a rash, appearing as three black dots as per pt's mom, to the upper back with associated itching and discomfort onset "a few days" ago. She did not notice the rash until onset of itchiness at that time. She states that pressure and rubbing to the area alleviates the sx. She states no exacerbating factors. Pt states that the rash may be caused by a spider bite or bed bugs. However, she denies seeing anything bite her. Pt denies having taken any medications PTA. Pt denies using any new lotions, soaps, or detergents.   Pt has hx of uncontrolled DM.  Pt checked her own blood sugar this morning for a reading of 98. She endorses compliance with DM medications.    Past Medical History  Diagnosis Date  . Gonorrhea 08/2011    Treated in 09/2011  . Diabetes mellitus 2001    Diagnosed at age 70 ; Type I  . DKA (diabetic ketoacidoses) ([place]) 08/19/2013   Past Surgical History  Procedure Laterality Date  . No past surgeries    . Incision and drainage perirectal abscess Right 08/18/2013    Procedure: IRRIGATION AND DEBRIDEMENT GLUTEAL ABSCESS;  Surgeon: Ralene Ok, MD;  Location: [facility];  Service: General;  Laterality: Right;  . Incision and drainage perirectal abscess Right 09/19/2013    Procedure: IRRIGATION AND DEBRIDEMENT RIGHT GLUTEAL AND LABIAL ABSCESSES;  Surgeon: Ralene Ok, MD;  Location: [facility];  Service: General;  Laterality: Right;  .  Incision and drainage perirectal abscess Right 09/24/2013    Procedure: IRRIGATION AND DEBRIDEMENT PERIRECTAL ABSCESS;  Surgeon: Gwenyth Ober, MD;  Location: [facility];  Service: General;  Laterality: Right;   Family History  Problem Relation Age of Onset  . Anesthesia problems Neg Hx   . Asthma Mother   . Gout Father   . Diabetes Paternal Grandmother    Social History  Substance Use Topics  . Smoking status: Never Smoker   . Smokeless tobacco: Never Used  . Alcohol Use: Yes     Comment: occ   OB History    Gravida Para Term Preterm AB TAB SAB Ectopic Multiple Living   1 0   1  1   0     Review of Systems  All other systems reviewed and are negative. 10 Systems reviewed and all are negative for acute change except as noted in the HPI.  Allergies  Penicillins  Home Medications   Prior to Admission medications   Medication Sig Start Date End Date Taking? Authorizing Provider  acetaminophen (TYLENOL) 325 MG tablet Take 1 tablet (325 mg total) by mouth every 6 (six) hours as needed for mild pain or moderate pain. Patient not taking: Reported on 03/12/2015 07/27/14   Luan Moore, MD  cyclobenzaprine (FLEXERIL) 5 MG tablet Take 1 tablet (5 mg total) by mouth 3 (three) times daily as needed for muscle spasms. Patient not taking: Reported on 03/12/2015  01/05/15   Hope Bunnie Pion, NP  famotidine (PEPCID) 20 MG tablet Take 1 tablet (20 mg total) by mouth 2 (two) times daily. Patient not taking: Reported on 03/12/2015 01/05/15   Ashley Murrain, NP  insulin aspart (NOVOLOG) 100 UNIT/ML injection Inject 5-10 Units into the skin 2 (two) times daily. Per sliding scale    Historical Provider, MD  insulin aspart (NOVOLOG) 100 UNIT/ML injection Inject 5-10 Units into the skin 2 (two) times daily with a meal. Per sliding scale 06/20/15   Clayton Bibles, PA-C  insulin glargine (LANTUS) 100 UNIT/ML injection Inject 0.2 mLs (20 Units total) into the skin 2 (two) times daily with a meal. 06/20/15   Clayton Bibles, PA-C   Insulin Syringes, Disposable, U-100 0.3 ML MISC 1 Syringe by Does not apply route 3 (three) times daily. 06/20/15   Clayton Bibles, PA-C  metroNIDAZOLE (FLAGYL) 500 MG tablet Take 1 tablet (500 mg total) by mouth 2 (two) times daily. One po bid x 7 days 04/27/15   Mercedes Camprubi-Soms, PA-C  naphazoline-pheniramine (NAPHCON-A) 0.025-0.3 % ophthalmic solution Place 2 drops into the left eye 4 (four) times daily as needed for irritation. 05/22/15   Olivia Canter Sam, PA-C   BP 115/91 mmHg  Pulse 100  Temp(Src) 97.9 F (36.6 C) (Oral)  Resp 18  Ht 5\' 3"  (1.6 m)  Wt 114 lb (51.71 kg)  BMI 20.20 kg/m2  SpO2 99% Physical Exam  Constitutional: She is oriented to person, place, and time. She appears well-developed and well-nourished. No distress.  HENT:  Head: Normocephalic and atraumatic.  Eyes: Conjunctivae are normal. Right eye exhibits no discharge. Left eye exhibits no discharge. No scleral icterus.  Cardiovascular: Normal rate.   Pulmonary/Chest: Effort normal.  Neurological: She is alert and oriented to person, place, and time. Coordination normal.  Skin: Skin is warm and dry. Rash noted. She is not diaphoretic. No erythema. No pallor.  Small area of excoriations in middle upper back. No erythema, no drainage, non-vesicular. No surrounding cellulitis.   Psychiatric: She has a normal mood and affect. Her behavior is normal.  Nursing note and vitals reviewed.   ED Course  Procedures  DIAGNOSTIC STUDIES: Oxygen Saturation is 99% on RA, normal by my interpretation.    COORDINATION OF CARE: 9:03 AM-Discussed treatment plan which includes hydrocortisone cream and hydrOXYzine tablet  with pt at bedside and pt agreed to plan.   Labs Review Labs Reviewed - No data to display  Imaging Review No results found. I have personally reviewed and evaluated these images and lab results as part of my medical decision-making.   EKG Interpretation None      MDM   Final diagnoses:  Rash of back    Patient presents with rash to upper back. There are 3-4 excoriated lesions that have scabbed over. No surrounding cellulitis. The lesions are in the same stage of healing and are nonvesicular. Doubt zoster. Nondermatomal pattern. Recommend Atarax and hydrocortisone cream to relieve the itch. Patient may follow up with her primary care doctor is symptoms do not improve.   I personally performed the services described in this documentation, which was scribed in my presence. The recorded information has been reviewed and is accurate.       Dondra Spry [place], PA-C 07/30/15 1030  Leonard Schwartz, MD 08/04/15 [phone]

## 2015-07-30 NOTE — ED Notes (Signed)
Pt reports rash to upper back. States "I think this is a spider bite or bed bugs." reports itching and discomfort.

## 2015-07-30 NOTE — Discharge Instructions (Signed)
Apply cortisone cream to rash daily to relieve the itch. Avoid scratching this area to prevent potential infection. Take Atarax as needed for itch. Return to the ER if you experience severe worsening of her symptoms, spreading of this rash, redness or swelling around the wound, fever, chills. Otherwise follow-up with your primary care doctor for further evaluation.

## 2015-07-30 NOTE — ED Notes (Signed)
Declined W/C at D/C and was escorted to lobby by RN. 

## 2015-09-06 ENCOUNTER — Inpatient Hospital Stay: Payer: Medicaid Other | Admitting: Internal Medicine

## 2015-09-09 ENCOUNTER — Inpatient Hospital Stay: Payer: Medicaid Other

## 2015-11-13 ENCOUNTER — Emergency Department (HOSPITAL_COMMUNITY)
Admission: EM | Admit: 2015-11-13 | Discharge: 2015-11-13 | Disposition: A | Discharge status: Home / Self Care | Payer: Medicaid Other | Attending: Emergency Medicine | Admitting: Emergency Medicine

## 2015-11-13 ENCOUNTER — Encounter (HOSPITAL_COMMUNITY): Payer: Self-pay

## 2015-11-13 DIAGNOSIS — E1010 Type 1 diabetes mellitus with ketoacidosis without coma: Secondary | ICD-10-CM | POA: Insufficient documentation

## 2015-11-13 DIAGNOSIS — E1065 Type 1 diabetes mellitus with hyperglycemia: Secondary | ICD-10-CM | POA: Insufficient documentation

## 2015-11-13 LAB — BASIC METABOLIC PANEL
Anion gap: 11 (ref 5–15)
BUN: 9 mg/dL (ref 6–20)
CO2: 22 mmol/L (ref 22–32)
Calcium: 8.7 mg/dL — ABNORMAL LOW (ref 8.9–10.3)
Chloride: 89 mmol/L — ABNORMAL LOW (ref 101–111)
Creatinine, Ser: 0.75 mg/dL (ref 0.44–1.00)
GFR calc Af Amer: >60 mL/min (ref >60)
GFR calc non Af Amer: >60 mL/min (ref >60)
Glucose, Bld: 893 mg/dL (ref 65–99)
Potassium: 5 mmol/L (ref 3.5–5.1)
Sodium: 122 mmol/L — ABNORMAL LOW (ref 135–145)

## 2015-11-13 LAB — CBC
Hemoglobin: 12.9 g/dL (ref 12.0–15.0)
MCH: 27.6 pg (ref 26.0–34.0)
PLATELETS: 253 10*3/uL (ref 150–400)
RBC: 4.67 MIL/uL (ref 3.87–5.11)
WBC: 6.2 10*3/uL (ref 4.0–10.5)

## 2015-11-13 LAB — I-STAT BETA HCG BLOOD, ED (MC, WL, AP ONLY): I-stat hCG, quantitative: <5 m[IU]/mL (ref <5)

## 2015-11-13 LAB — CBG MONITORING, ED
GLUCOSE-CAPILLARY: 417 mg/dL — AB (ref 65–99)
Glucose-Capillary: >600 mg/dL (ref 65–99)

## 2015-11-13 MED ORDER — SODIUM CHLORIDE 0.9 % IV BOLUS (SEPSIS)
500.0000 mL | Freq: Once | INTRAVENOUS | Status: AC
  Administered 2015-11-13: 500 mL via INTRAVENOUS

## 2015-11-13 MED ORDER — INSULIN ASPART 100 UNIT/ML ~~LOC~~ SOLN
10.0000 [IU] | Freq: Once | SUBCUTANEOUS | Status: AC
  Administered 2015-11-13: 10 [IU] via INTRAVENOUS
  Filled 2015-11-13: qty 1

## 2015-11-13 MED ORDER — INSULIN GLARGINE 100 UNIT/ML ~~LOC~~ SOLN: 20.0000 [IU] | Freq: Two times a day (BID) | SUBCUTANEOUS | Status: DC

## 2015-11-13 NOTE — Discharge Instructions (Signed)
As we discussed, though your blood sugar is running high, it is not dangerous at this time.  Making adjustments in the Emergency Department (ED) and possibly causing your glucose level to drop too low is more dangerous than continuing your cu Blood Glucose Monitoring, Adult Monitoring your blood glucose (also know as blood sugar) helps you to manage your diabetes. It also helps you and your health care provider monitor your diabetes and determine how well your treatment plan is working. WHY SHOULD YOU MONITOR YOUR BLOOD GLUCOSE?  It can help you understand how food, exercise, and medicine affect your blood glucose.  It allows you to know what your blood glucose is at any given moment. You can quickly tell if you are having low blood glucose (hypoglycemia) or high blood glucose (hyperglycemia).  It can help you and your health care provider know how to adjust your medicines.  It can help you understand how to manage an illness or adjust medicine for exercise. WHEN SHOULD YOU TEST? Your health care provider will help you decide how often you should check your blood glucose. This may depend on the type of diabetes you have, your diabetes control, or the types of medicines you are taking. Be sure to write down all of your blood glucose readings so that this information can be reviewed with your health care provider. See below for examples of testing times that your health care provider may suggest. Type 1 Diabetes  Test at least 2 times per day if your diabetes is well controlled, if you are using an insulin pump, or if you perform multiple daily injections.  If your diabetes is not well controlled or if you are sick, you may need to test more often.  It is a good idea to also test:  Before every insulin injection.  Before and after exercise.  Between meals and 2 hours after a meal.  Occasionally between 2:00 a.m. and 3:00 a.m. Type 2 Diabetes  If you are taking insulin, test at least 2  times per day. However, it is best to test before every insulin injection.  If you take medicines by mouth (orally), test 2 times a day.  If you are on a controlled diet, test once a day.  If your diabetes is not well controlled or if you are sick, you may need to monitor more often. HOW TO MONITOR YOUR BLOOD GLUCOSE Supplies Needed  Blood glucose meter.  Test strips for your meter. Each meter has its own strips. You must use the strips that go with your own meter.  A pricking needle (lancet).  A device that holds the lancet (lancing device).  A journal or log book to write down your results. Procedure  Wash your hands with soap and water. Alcohol is not preferred.  Prick the side of your finger (not the tip) with the lancet.  Gently milk the finger until a small drop of blood appears.  Follow the instructions that come with your meter for inserting the test strip, applying blood to the strip, and using your blood glucose meter. Other Areas to Get Blood for Testing Some meters allow you to use other areas of your body (other than your finger) to test your blood. These areas are called alternative sites. The most common alternative sites are:  The forearm.  The thigh.  The back area of the lower leg.  The palm of the hand. The blood flow in these areas is slower. Therefore, the blood glucose values you  get may be delayed, and the numbers are different from what you would get from your fingers. Do not use alternative sites if you think you are having hypoglycemia. Your reading will not be accurate. Always use a finger if you are having hypoglycemia. Also, if you cannot feel your lows (hypoglycemia unawareness), always use your fingers for your blood glucose checks. ADDITIONAL TIPS FOR GLUCOSE MONITORING  Do not reuse lancets.  Always carry your supplies with you.  All blood glucose meters have a 24-hour "hotline" number to call if you have questions or need help.  Adjust  (calibrate) your blood glucose meter with a control solution after finishing a few boxes of strips. BLOOD GLUCOSE RECORD KEEPING It is a good idea to keep a daily record or log of your blood glucose readings. Most glucose meters, if not all, keep your glucose records stored in the meter. Some meters come with the ability to download your records to your home computer. Keeping a record of your blood glucose readings is especially helpful if you are wanting to look for patterns. Make notes to go along with the blood glucose readings because you might forget what happened at that exact time. Keeping good records helps you and your health care provider to work together to achieve good diabetes management.    This information is not intended to replace advice given to you by your health care provider. Make sure you discuss any questions you have with your health care provider.   Document Released: 04/19/2003 Document Revised: 05/07/2014 Document Reviewed: 09/08/2012 Elsevier Interactive Patient Education [employer]. rrent medications at this time until you can follow up with your clinic doctor.  Please continue your medications and follow up with your regular doctor as recommended in these documents.  If you develop new or worsening symptoms that concern you, please return to the Emergency Department.

## 2015-11-13 NOTE — ED Provider Notes (Signed)
Emergency Department Provider Note  Time seen: Approximately 3:52 PM  I have reviewed the triage vital signs and the nursing notes.   HISTORY  Chief Complaint Hyperglycemia   HPI Ashley Freeman is a 25 y.o. female with PMH of IDDM and prior episodes of DKA presents to the emergency department for evaluation of persistently elevated blood sugars at home. She reports that she believes her blood sugars are elevated because her "medication is old." She typically takes Lantus 20 U BID and Novolog sliding scale with reported good control. She notes that her NovoLog is new but her Lantus may be outdated. She has not checked the expiration date but feels this may be the case. She does have an endocrinologist but has not seen them because of scheduling conflicts with working third shift. She reports a follow-up appointment in late July or early August.   Over the past several days she's been feeling more fatigued mild headache and polyuria. She denies any associated abdominal pain, nausea, vomiting. She reports this does not feel like prior episodes of DKA. She denies any associated fevers or shaking chills. No dysuria.    Past Medical History  Diagnosis Date  . Gonorrhea 08/2011    Treated in 09/2011  . Diabetes mellitus 2001    Diagnosed at age 37 ; Type I  . DKA (diabetic ketoacidoses) ([facility]) 08/19/2013    Patient Active Problem List   Diagnosis Date Noted  . AKI (acute kidney injury) ([place]) 07/26/2014  . Infection of urinary tract 04/08/2014  . PID (acute pelvic inflammatory disease) 04/06/2014  . DKA (diabetic ketoacidoses) ([place]) 09/19/2013  . Infected cyst of Bartholin's gland duct 09/19/2013  . Bartholin's gland abscess 09/19/2013  . Sepsis ([place]) 09/19/2013  . Abscess, gluteal, right 08/24/2013  . Health care maintenance 08/24/2013  . Hypophosphatemia 08/18/2013  . Hypomagnesemia 08/18/2013  . Leukocytosis, unspecified 08/17/2013  . Fever, unspecified 08/17/2013  . H/O  medication noncompliance 08/12/2013  . Hyponatremia 08/04/2012  . Anemia 02/19/2012  . Hyperglycemia 11/06/2011  . Diabetes mellitus type 1 (uncontrolled) 10/13/1997    Past Surgical History  Procedure Laterality Date  . No past surgeries    . Incision and drainage perirectal abscess Right 08/18/2013    Procedure: IRRIGATION AND DEBRIDEMENT GLUTEAL ABSCESS;  Surgeon: Ralene Ok, MD;  Location: [facility];  Service: General;  Laterality: Right;  . Incision and drainage perirectal abscess Right 09/19/2013    Procedure: IRRIGATION AND DEBRIDEMENT RIGHT GLUTEAL AND LABIAL ABSCESSES;  Surgeon: Ralene Ok, MD;  Location: [facility];  Service: General;  Laterality: Right;  . Incision and drainage perirectal abscess Right 09/24/2013    Procedure: IRRIGATION AND DEBRIDEMENT PERIRECTAL ABSCESS;  Surgeon: Gwenyth Ober, MD;  Location: [facility];  Service: General;  Laterality: Right;    Current Outpatient Rx  Name  Route  Sig  Dispense  Refill  . acetaminophen (TYLENOL) 325 MG tablet   Oral   Take 1 tablet (325 mg total) by mouth every 6 (six) hours as needed for mild pain or moderate pain. Patient not taking: Reported on 03/12/2015   20 tablet   0   . cyclobenzaprine (FLEXERIL) 5 MG tablet   Oral   Take 1 tablet (5 mg total) by mouth 3 (three) times daily as needed for muscle spasms. Patient not taking: Reported on 03/12/2015   30 tablet   0   . famotidine (PEPCID) 20 MG tablet   Oral   Take 1 tablet (20 mg total) by mouth  2 (two) times daily. Patient not taking: Reported on 03/12/2015   30 tablet   0   . hydrocortisone cream 1 %      Apply to affected area 2 times daily   15 g   0   . hydrOXYzine (ATARAX/VISTARIL) 25 MG tablet   Oral   Take 1 tablet (25 mg total) by mouth every 6 (six) hours.   12 tablet   0   . insulin aspart (NOVOLOG) 100 UNIT/ML injection   Subcutaneous   Inject 5-10 Units into the skin 2 (two) times daily. Per sliding scale         . insulin aspart  (NOVOLOG) 100 UNIT/ML injection   Subcutaneous   Inject 5-10 Units into the skin 2 (two) times daily with a meal. Per sliding scale   10 mL   2   . insulin glargine (LANTUS) 100 UNIT/ML injection   Subcutaneous   Inject 0.2 mLs (20 Units total) into the skin 2 (two) times daily with a meal.   10 mL   0   . Insulin Syringes, Disposable, U-100 0.3 ML MISC   Does not apply   1 Syringe by Does not apply route 3 (three) times daily.   90 each   0   . metroNIDAZOLE (FLAGYL) 500 MG tablet   Oral   Take 1 tablet (500 mg total) by mouth 2 (two) times daily. One po bid x 7 days   14 tablet   0   . naphazoline-pheniramine (NAPHCON-A) 0.025-0.3 % ophthalmic solution   Left Eye   Place 2 drops into the left eye 4 (four) times daily as needed for irritation.   5 mL   0     Allergies Penicillins  Family History  Problem Relation Age of Onset  . Anesthesia problems Neg Hx   . Asthma Mother   . Gout Father   . Diabetes Paternal Grandmother     Social History Social History  Substance Use Topics  . Smoking status: Never Smoker   . Smokeless tobacco: Never Used  . Alcohol Use: Yes     Comment: occ    Review of Systems  Constitutional: No fever/chills. Positive fatigue and elevated blood glucose.  Eyes: No visual changes. ENT: No sore throat. Cardiovascular: Denies chest pain. Respiratory: Denies shortness of breath. Gastrointestinal: No abdominal pain.  No nausea, no vomiting.  No diarrhea.  No constipation. Genitourinary: Negative for dysuria. Positive polyuria.  Musculoskeletal: Negative for back pain. Skin: Negative for rash. Neurological: Negative for focal weakness or numbness. Positive mild HA.   10-point ROS otherwise negative.  ____________________________________________   PHYSICAL EXAM:  VITAL SIGNS: ED Triage Vitals  Enc Vitals Group     BP 11/13/15 1506 116/85 mmHg     Pulse Rate 11/13/15 1506 103     Resp 11/13/15 1506 20     Temp 11/13/15 1506  98.3 F (36.8 C)     Temp Source 11/13/15 1506 Oral     SpO2 11/13/15 1506 98 %     Weight 11/13/15 1506 114 lb (51.71 kg)     Height 11/13/15 1506 5\' 3"  (1.6 m)   Constitutional: Alert and oriented. Well appearing and in no acute distress. Thin build.  Eyes: Conjunctivae are normal. PERRL. EOMI. Head: Atraumatic. Nose: No congestion/rhinnorhea. Mouth/Throat: Mucous membranes are moist.  Oropharynx non-erythematous. Neck: No stridor.  No meningeal signs.   Cardiovascular: Tachycardia. Good peripheral circulation. Grossly normal heart sounds.   Respiratory: Normal respiratory effort.  No retractions. Lungs CTAB. Gastrointestinal: Soft and nontender. No distention.  Musculoskeletal: No lower extremity tenderness nor edema. No gross deformities of extremities. Neurologic:  Normal speech and language. No gross focal neurologic deficits are appreciated.  Skin:  Skin is warm, dry and intact. No rash noted. Psychiatric: Mood and affect are normal. Speech and behavior are normal.  ____________________________________________   LABS (all labs ordered are listed, but only abnormal results are displayed)  Labs Reviewed  BASIC METABOLIC PANEL - Abnormal; Notable for the following:    Sodium 122 (*)    Chloride 89 (*)    Glucose, Bld 893 (*)    Calcium 8.7 (*)    All other components within normal limits  CBG MONITORING, ED - Abnormal; Notable for the following:    Glucose-Capillary >600 (*)    All other components within normal limits  CBG MONITORING, ED - Abnormal; Notable for the following:    Glucose-Capillary 417 (*)    All other components within normal limits  CBC  URINALYSIS, ROUTINE W REFLEX MICROSCOPIC (NOT AT [facility])  I-STAT BETA HCG BLOOD, ED ([facility], [facility], [facility] ONLY)    PROCEDURES  Procedure(s) performed:   Procedures  None ____________________________________________   INITIAL IMPRESSION / ASSESSMENT AND PLAN / ED COURSE  Pertinent labs & imaging results that were  available during my care of the patient were reviewed by me and considered in my medical decision making (see chart for details).  Patient resents to the emergency department for evaluation of hyperglycemia. On my exam she is overall well-appearing with mild tachycardia. Dry mucous membranes. Blood glucose in triage is greater than 600. Chemistry panel pending to evaluate for underlying DKA. Will give oral rehydration at this time pending chemistry results.  04:32 PM Blood glucose 893 on the BMP with pseudohyponatremia. Potassium normal. Plan for IV fluids and IV short-acting insulin. Patient has a CO2 of 22 and no anion gap. DKA very unlikely in this situation. Patient is overall well-appearing and tolerating PO. Plan for treatment of hyperglycemia and re-check blood glucose. If down-trending will refill patient's home insulin Rx. She is following with her endocrinologist in the [place] 1[place]2 weeks.   05:37 PM Repeat blood sugar 417. Will discharge home with Lantus Rx and instructions to check blood sugars every 2-3 hours at home. Patient denies needing testing supplies or additional assistance to manage hyperglycemia at home. Discussed return precautions in detail.  ____________________________________________  FINAL CLINICAL IMPRESSION(S) / ED DIAGNOSES  Final diagnoses:  Hyperglycemia due to type 1 diabetes mellitus (HCC)     MEDICATIONS GIVEN DURING THIS VISIT:  Medications  insulin aspart (novoLOG) injection 10 Units (10 Units Intravenous Given 11/13/15 1617)  sodium chloride 0.9 % bolus 500 mL (0 mLs Intravenous Stopped 11/13/15 1740)     NEW OUTPATIENT MEDICATIONS STARTED DURING THIS VISIT:  Medication refill.    Note:  This document was prepared using Dragon voice recognition software and may include unintentional dictation errors.  Nanda Quinton, MD Emergency Medicine  Margette Fast, MD 11/13/15 [phone]

## 2015-11-13 NOTE — ED Notes (Signed)
Patient here with fatigue, hyperglycemia and urinary frequency x 2 days. denies pain. Alert and oriented

## 2015-11-20 ENCOUNTER — Emergency Department (HOSPITAL_COMMUNITY): Payer: Self-pay

## 2015-11-20 ENCOUNTER — Emergency Department (HOSPITAL_COMMUNITY)
Admission: EM | Admit: 2015-11-20 | Discharge: 2015-11-20 | Disposition: A | Discharge status: Home / Self Care | Payer: Self-pay | Attending: Physician Assistant | Admitting: Physician Assistant

## 2015-11-20 DIAGNOSIS — R51 Headache: Secondary | ICD-10-CM | POA: Insufficient documentation

## 2015-11-20 DIAGNOSIS — W19XXXA Unspecified fall, initial encounter: Secondary | ICD-10-CM

## 2015-11-20 DIAGNOSIS — Y999 Unspecified external cause status: Secondary | ICD-10-CM | POA: Insufficient documentation

## 2015-11-20 DIAGNOSIS — N760 Acute vaginitis: Secondary | ICD-10-CM | POA: Insufficient documentation

## 2015-11-20 DIAGNOSIS — Y92009 Unspecified place in unspecified non-institutional (private) residence as the place of occurrence of the external cause: Secondary | ICD-10-CM

## 2015-11-20 DIAGNOSIS — W228XXA Striking against or struck by other objects, initial encounter: Secondary | ICD-10-CM | POA: Insufficient documentation

## 2015-11-20 DIAGNOSIS — E1010 Type 1 diabetes mellitus with ketoacidosis without coma: Secondary | ICD-10-CM | POA: Insufficient documentation

## 2015-11-20 DIAGNOSIS — Y929 Unspecified place or not applicable: Secondary | ICD-10-CM | POA: Insufficient documentation

## 2015-11-20 DIAGNOSIS — B9689 Other specified bacterial agents as the cause of diseases classified elsewhere: Secondary | ICD-10-CM | POA: Insufficient documentation

## 2015-11-20 DIAGNOSIS — Y939 Activity, unspecified: Secondary | ICD-10-CM | POA: Insufficient documentation

## 2015-11-20 LAB — URINALYSIS, ROUTINE W REFLEX MICROSCOPIC
Bilirubin Urine: NEGATIVE
Glucose, UA: >1000 mg/dL — AB
HGB URINE DIPSTICK: NEGATIVE
Ketones, ur: 40 mg/dL — AB
LEUKOCYTES UA: NEGATIVE
Nitrite: NEGATIVE
PH: 5.5 (ref 5.0–8.0)
PROTEIN: NEGATIVE mg/dL
SPECIFIC GRAVITY, URINE: 1.038 — AB (ref 1.005–1.030)

## 2015-11-20 LAB — CBG MONITORING, ED: Glucose-Capillary: 487 mg/dL — ABNORMAL HIGH (ref 65–99)

## 2015-11-20 LAB — WET PREP, GENITAL
Sperm: NONE SEEN
Trich, Wet Prep: NONE SEEN
Yeast Wet Prep HPF POC: NONE SEEN

## 2015-11-20 LAB — URINE MICROSCOPIC-ADD ON
BACTERIA UA: NONE SEEN
RBC / HPF: NONE SEEN RBC/hpf (ref 0–5)

## 2015-11-20 LAB — PREGNANCY, URINE: Preg Test, Ur: NEGATIVE

## 2015-11-20 MED ORDER — ACETAMINOPHEN 325 MG PO TABS
650.0000 mg | ORAL_TABLET | Freq: Once | ORAL | Status: AC
  Administered 2015-11-20: 650 mg via ORAL
  Filled 2015-11-20: qty 2

## 2015-11-20 MED ORDER — METRONIDAZOLE 500 MG PO TABS: 500.0000 mg | ORAL_TABLET | Freq: Two times a day (BID) | ORAL | 0 refills | Status: DC

## 2015-11-20 MED ORDER — INSULIN GLARGINE 100 UNIT/ML ~~LOC~~ SOLN
20.0000 [IU] | Freq: Once | SUBCUTANEOUS | Status: AC
  Administered 2015-11-20: 20 [IU] via SUBCUTANEOUS
  Filled 2015-11-20: qty 0.2

## 2015-11-20 MED ORDER — METRONIDAZOLE 500 MG PO TABS
500.0000 mg | ORAL_TABLET | Freq: Once | ORAL | Status: AC
  Administered 2015-11-20: 500 mg via ORAL
  Filled 2015-11-20: qty 1

## 2015-11-20 NOTE — ED Notes (Signed)
Pt came into ED during [facility] downtime-- see attached paper chart.

## 2015-11-20 NOTE — ED Notes (Signed)
[place] sandwich and water given to patient.

## 2015-11-20 NOTE — ED Provider Notes (Signed)
[facility] DEPT Provider Note   CSN: [id number] Arrival date & time: 11/20/15  0140  First Provider Contact:  None       History   Chief Complaint Chief Complaint  Patient presents with  . Fall    HPI Ashley Freeman is a 25 y.o. female.    HPI   Patient is a 25 year old female presenting after fall. Patient fell at 10 PM again out of the shower. She says she struck her right side and head. Patient has had dizziness and increasing headache at home so came to the emergency department.  Patient has had mild nausea. No vomiting. No neurologic changes otherwise. Patient presents also with concern over urinary tract infection she is burning while she urinates. She also STD check at this time. Patient has no vaginal discharge. Last menstrual cycle was 2 months ago.  Past Medical History:  Diagnosis Date  . Diabetes mellitus 2001   Diagnosed at age 31 ; Type I  . DKA (diabetic ketoacidoses) ([place]) 08/19/2013  . Gonorrhea 08/2011   Treated in 09/2011    Patient Active Problem List   Diagnosis Date Noted  . AKI (acute kidney injury) ([place]) 07/26/2014  . Infection of urinary tract 04/08/2014  . PID (acute pelvic inflammatory disease) 04/06/2014  . DKA (diabetic ketoacidoses) ([place]) 09/19/2013  . Infected cyst of Bartholin's gland duct 09/19/2013  . Bartholin's gland abscess 09/19/2013  . Sepsis ([place]) 09/19/2013  . Abscess, gluteal, right 08/24/2013  . Health care maintenance 08/24/2013  . Hypophosphatemia 08/18/2013  . Hypomagnesemia 08/18/2013  . Leukocytosis, unspecified 08/17/2013  . Fever, unspecified 08/17/2013  . H/O medication noncompliance 08/12/2013  . Hyponatremia 08/04/2012  . Anemia 02/19/2012  . Hyperglycemia 11/06/2011  . Diabetes mellitus type 1 (uncontrolled) 10/13/1997    Past Surgical History:  Procedure Laterality Date  . INCISION AND DRAINAGE PERIRECTAL ABSCESS Right 08/18/2013   Procedure: IRRIGATION AND DEBRIDEMENT GLUTEAL ABSCESS;  Surgeon:  Ralene Ok, MD;  Location: [place];  Service: General;  Laterality: Right;  . INCISION AND DRAINAGE PERIRECTAL ABSCESS Right 09/19/2013   Procedure: IRRIGATION AND DEBRIDEMENT RIGHT GLUTEAL AND LABIAL ABSCESSES;  Surgeon: Ralene Ok, MD;  Location: [facility];  Service: General;  Laterality: Right;  . INCISION AND DRAINAGE PERIRECTAL ABSCESS Right 09/24/2013   Procedure: IRRIGATION AND DEBRIDEMENT PERIRECTAL ABSCESS;  Surgeon: Gwenyth Ober, MD;  Location: [facility];  Service: General;  Laterality: Right;  . NO PAST SURGERIES      OB History    Gravida Para Term Preterm AB Living   1 0     1 0   SAB TAB Ectopic Multiple Live Births   1               Home Medications    Prior to Admission medications   Medication Sig Start Date End Date Taking? Authorizing Provider  acetaminophen (TYLENOL) 325 MG tablet Take 1 tablet (325 mg total) by mouth every 6 (six) hours as needed for mild pain or moderate pain. Patient not taking: Reported on 03/12/2015 07/27/14   Luan Moore, MD  cyclobenzaprine (FLEXERIL) 5 MG tablet Take 1 tablet (5 mg total) by mouth 3 (three) times daily as needed for muscle spasms. Patient not taking: Reported on 03/12/2015 01/05/15   Ashley Murrain, NP  famotidine (PEPCID) 20 MG tablet Take 1 tablet (20 mg total) by mouth 2 (two) times daily. Patient not taking: Reported on 03/12/2015 01/05/15   Ashley Murrain, NP  hydrocortisone cream 1 % Apply  to affected area 2 times daily 07/30/15   Samantha Tripp Dowless, PA-C  hydrOXYzine (ATARAX/VISTARIL) 25 MG tablet Take 1 tablet (25 mg total) by mouth every 6 (six) hours. 07/30/15   Samantha Tripp Dowless, PA-C  insulin aspart (NOVOLOG) 100 UNIT/ML injection Inject 5-10 Units into the skin 2 (two) times daily. Per sliding scale    Historical Provider, MD  insulin aspart (NOVOLOG) 100 UNIT/ML injection Inject 5-10 Units into the skin 2 (two) times daily with a meal. Per sliding scale 06/20/15   Clayton Bibles, PA-C  insulin glargine (LANTUS) 100  UNIT/ML injection Inject 0.2 mLs (20 Units total) into the skin 2 (two) times daily with a meal. 11/13/15   Margette Fast, MD  Insulin Syringes, Disposable, U-100 0.3 ML MISC 1 Syringe by Does not apply route 3 (three) times daily. 06/20/15   Clayton Bibles, PA-C  metroNIDAZOLE (FLAGYL) 500 MG tablet Take 1 tablet (500 mg total) by mouth 2 (two) times daily. One po bid x 7 days 04/27/15   Mercedes Camprubi-Soms, PA-C  naphazoline-pheniramine (NAPHCON-A) 0.025-0.3 % ophthalmic solution Place 2 drops into the left eye 4 (four) times daily as needed for irritation. 05/22/15   Anne Ng, PA-C    Family History Family History  Problem Relation Age of Onset  . Anesthesia problems Neg Hx   . Asthma Mother   . Gout Father   . Diabetes Paternal Grandmother     Social History Social History  Substance Use Topics  . Smoking status: Never Smoker  . Smokeless tobacco: Never Used  . Alcohol use Yes     Comment: occ     Allergies   Penicillins   Review of Systems Review of Systems  Constitutional: Negative for chills and fever.  HENT: Negative for ear pain and sore throat.   Eyes: Negative for pain and visual disturbance.  Respiratory: Negative for cough and shortness of breath.   Cardiovascular: Negative for chest pain and palpitations.  Gastrointestinal: Negative for abdominal pain and vomiting.  Genitourinary: Positive for dysuria. Negative for hematuria.  Musculoskeletal: Negative for arthralgias and back pain.  Skin: Negative for color change and rash.  Neurological: Positive for headaches. Negative for seizures and syncope.  All other systems reviewed and are negative.    Physical Exam Updated Vital Signs BP 114/81 (BP Location: Right Arm)   Pulse 94   SpO2 100%   Physical Exam  Constitutional: She appears well-developed and well-nourished. No distress.  HENT:  Head: Normocephalic and atraumatic.  Eyes: Conjunctivae are normal.  Neck: Neck supple.  Cardiovascular: Normal  rate and regular rhythm.   No murmur heard. Pulmonary/Chest: Effort normal and breath sounds normal. No respiratory distress.  Abdominal: Soft. There is no tenderness.  Genitourinary: Vagina normal.  Genitourinary Comments: White discharge  Musculoskeletal: She exhibits no edema.  Neurological: She is alert.  Equal strength bilaterally upper and lower extremities negative pronator drift. Normal sensation bilaterally. Speech comprehensible, no slurring. Facial nerve tested and appears grossly normal. Alert and oriented 3.   Skin: Skin is warm and dry.  Psychiatric: She has a normal mood and affect.  Nursing note and vitals reviewed.    ED Treatments / Results  Labs (all labs ordered are listed, but only abnormal results are displayed) Labs Reviewed  URINE CULTURE  URINALYSIS, ROUTINE W REFLEX MICROSCOPIC (NOT AT [facility])  PREGNANCY, URINE  GC/CHLAMYDIA PROBE AMP ([place]) NOT AT [facility]    EKG  EKG Interpretation None  Radiology No results found.  Procedures Procedures (including critical care time)  Medications Ordered in ED Medications  acetaminophen (TYLENOL) tablet 650 mg (not administered)     Initial Impression / Assessment and Plan / ED Course  I have reviewed the triage vital signs and the nursing notes.  Pertinent labs & imaging results that were available during my care of the patient were reviewed by me and considered in my medical decision making (see chart for details).  Clinical Course  Comment By Time  9:42 AM CBG elevated, no symptoms.  Will encourage fluids and ask patietn if she took home meds Rider Ermis Julio Alm, MD 07/23 0942  10:53 AM Patietn taking PO. Elevated sugar, gave home insulin.  Gave patietn option of IV vs PO fluids, she would like PO.    Feleica Fulmore Julio Alm, MD 07/23 1053    Patient is a 25 year old female presenting after a fall. Patient struck her head and right side along her flank. Patient has no trouble urinating.  Patient has no hematuria. Patient has mild dizziness and residual headache. We had patient centered discussion about CT versus not. She is concerned about injuring her head. We will get CT. Patient also concerned about STD check at this time. We'll do vaginal exam, send cultures, get UA for urinary tract infection.  Ct neg. Vag exam shows white discharge, + clue cells. Will treat for BV.    Final Clinical Impressions(s) / ED Diagnoses   Final diagnoses:  None    New Prescriptions New Prescriptions   No medications on file     Myrian Botello Julio Alm, MD 11/20/15 1523

## 2015-11-20 NOTE — ED Notes (Signed)
Pt c/o pain on right side-- states she fell out getting out of shower last night at 10pm

## 2015-11-20 NOTE — Discharge Instructions (Signed)
Take metronidazole to help.  Please rest and drink plenty of fluids.  Check your sugars at home!

## 2015-11-20 NOTE — ED Notes (Signed)
Pt also c/o UTI symptoms-- "hurts a little with urination, no vag discharge"

## 2015-11-21 LAB — URINE CULTURE

## 2015-11-21 LAB — CERVICOVAGINAL ANCILLARY ONLY
CHLAMYDIA, DNA PROBE: NEGATIVE
Neisseria Gonorrhea: NEGATIVE

## 2015-11-21 LAB — GC/CHLAMYDIA PROBE AMP (~~LOC~~) NOT AT ARMC
Chlamydia: NEGATIVE
Neisseria Gonorrhea: NEGATIVE

## 2015-11-22 NOTE — Progress Notes (Signed)
ED Antimicrobial Stewardship Positive Culture Follow Up   Ashley Freeman is an 25 y.o. female who presented to [facility] on 11/20/2015 with a chief complaint of  Chief Complaint  Patient presents with  . Fall    Recent Results (from the past 44 hour(s))  Urine culture     Status: Abnormal   Collection Time: 11/20/15  9:05 AM  Result Value Ref Range Status   Specimen Description URINE, RANDOM  Final   Special Requests NONE  Final   Culture (A)  Final    80,000 COLONIES/mL GROUP B STREP(S.AGALACTIAE)ISOLATED TESTING AGAINST S. AGALACTIAE NOT ROUTINELY PERFORMED DUE TO PREDICTABILITY OF AMP/PEN/VAN SUSCEPTIBILITY.    Report Status 11/21/2015 FINAL  Final  Wet prep, genital     Status: Abnormal   Collection Time: 11/20/15 10:20 AM  Result Value Ref Range Status   Yeast Wet Prep HPF POC NONE SEEN NONE SEEN Final   Trich, Wet Prep NONE SEEN NONE SEEN Final   Clue Cells Wet Prep HPF POC PRESENT (A) NONE SEEN Final   WBC, Wet Prep HPF POC FEW (A) NONE SEEN Final   Sperm NONE SEEN  Final    Patient is being treated for BV with metronidazole.  80K colonies of Group B Strep is considered a colonizer and no treatment is recommended for this organism.  Norva Riffle 11/22/2015, 8:39 AM Infectious Diseases Pharmacist Phone# [phone]

## 2015-11-24 ENCOUNTER — Encounter (HOSPITAL_COMMUNITY): Payer: Self-pay | Admitting: *Deleted

## 2015-11-24 ENCOUNTER — Ambulatory Visit: Payer: Self-pay | Attending: Physician Assistant | Admitting: Physician Assistant

## 2015-11-24 ENCOUNTER — Inpatient Hospital Stay (HOSPITAL_COMMUNITY)
Admission: EM | Admit: 2015-11-24 | Discharge: 2015-11-27 | DRG: 638 | Disposition: A | Discharge status: Home / Self Care | Payer: Medicaid Other | Attending: Internal Medicine | Admitting: Internal Medicine

## 2015-11-24 DIAGNOSIS — R739 Hyperglycemia, unspecified: Secondary | ICD-10-CM | POA: Diagnosis present

## 2015-11-24 DIAGNOSIS — E860 Dehydration: Secondary | ICD-10-CM | POA: Diagnosis present

## 2015-11-24 DIAGNOSIS — E1065 Type 1 diabetes mellitus with hyperglycemia: Secondary | ICD-10-CM | POA: Diagnosis present

## 2015-11-24 DIAGNOSIS — R000 Tachycardia, unspecified: Secondary | ICD-10-CM | POA: Diagnosis present

## 2015-11-24 DIAGNOSIS — E108 Type 1 diabetes mellitus with unspecified complications: Secondary | ICD-10-CM

## 2015-11-24 DIAGNOSIS — B9689 Other specified bacterial agents as the cause of diseases classified elsewhere: Secondary | ICD-10-CM | POA: Diagnosis present

## 2015-11-24 DIAGNOSIS — Z833 Family history of diabetes mellitus: Secondary | ICD-10-CM

## 2015-11-24 DIAGNOSIS — Z794 Long term (current) use of insulin: Secondary | ICD-10-CM

## 2015-11-24 DIAGNOSIS — Z825 Family history of asthma and other chronic lower respiratory diseases: Secondary | ICD-10-CM

## 2015-11-24 DIAGNOSIS — Z9114 Patient's other noncompliance with medication regimen: Secondary | ICD-10-CM

## 2015-11-24 DIAGNOSIS — N760 Acute vaginitis: Secondary | ICD-10-CM

## 2015-11-24 DIAGNOSIS — T383X6A Underdosing of insulin and oral hypoglycemic [antidiabetic] drugs, initial encounter: Secondary | ICD-10-CM | POA: Diagnosis present

## 2015-11-24 DIAGNOSIS — N179 Acute kidney failure, unspecified: Secondary | ICD-10-CM | POA: Diagnosis present

## 2015-11-24 DIAGNOSIS — E1010 Type 1 diabetes mellitus with ketoacidosis without coma: Secondary | ICD-10-CM

## 2015-11-24 DIAGNOSIS — R0789 Other chest pain: Secondary | ICD-10-CM | POA: Diagnosis present

## 2015-11-24 DIAGNOSIS — E871 Hypo-osmolality and hyponatremia: Secondary | ICD-10-CM | POA: Diagnosis present

## 2015-11-24 DIAGNOSIS — Z91120 Patient's intentional underdosing of medication regimen due to financial hardship: Secondary | ICD-10-CM

## 2015-11-24 DIAGNOSIS — R079 Chest pain, unspecified: Secondary | ICD-10-CM | POA: Diagnosis present

## 2015-11-24 DIAGNOSIS — R0782 Intercostal pain: Secondary | ICD-10-CM

## 2015-11-24 DIAGNOSIS — R7989 Other specified abnormal findings of blood chemistry: Secondary | ICD-10-CM | POA: Diagnosis present

## 2015-11-24 HISTORY — DX: Other specified bacterial agents as the cause of diseases classified elsewhere: B96.89

## 2015-11-24 HISTORY — DX: Acute vaginitis: N76.0

## 2015-11-24 LAB — MRSA PCR SCREENING: MRSA by PCR: NEGATIVE

## 2015-11-24 LAB — I-STAT VENOUS BLOOD GAS, ED
Acid-base deficit: 14 mmol/L — ABNORMAL HIGH (ref 0.0–2.0)
Bicarbonate: 12.5 mEq/L — ABNORMAL LOW (ref 20.0–24.0)
O2 Saturation: 77 %
PCO2 VEN: 31.2 mmHg — AB (ref 45.0–50.0)
PH VEN: 7.212 — AB (ref 7.250–7.300)
TCO2: 13 mmol/L (ref 0–100)
pO2, Ven: 50 mmHg — ABNORMAL HIGH (ref 31.0–45.0)

## 2015-11-24 LAB — CBC
HEMATOCRIT: 39.2 % (ref 36.0–46.0)
Hemoglobin: 12.2 g/dL (ref 12.0–15.0)
MCH: 28 pg (ref 26.0–34.0)
MCHC: 31.1 g/dL (ref 30.0–36.0)
MCV: 90.1 fL (ref 78.0–100.0)
Platelets: 281 10*3/uL (ref 150–400)
RBC: 4.35 MIL/uL (ref 3.87–5.11)
RDW: 13.6 % (ref 11.5–15.5)
WBC: 6 10*3/uL (ref 4.0–10.5)

## 2015-11-24 LAB — BASIC METABOLIC PANEL
Anion gap: 19 — ABNORMAL HIGH (ref 5–15)
Anion gap: 5 (ref 5–15)
BUN: 10 mg/dL (ref 6–20)
BUN: 6 mg/dL (ref 6–20)
CALCIUM: 8.5 mg/dL — AB (ref 8.9–10.3)
CHLORIDE: 88 mmol/L — AB (ref 101–111)
CO2: 12 mmol/L — ABNORMAL LOW (ref 22–32)
CO2: 19 mmol/L — ABNORMAL LOW (ref 22–32)
Calcium: 8.5 mg/dL — ABNORMAL LOW (ref 8.9–10.3)
Chloride: 112 mmol/L — ABNORMAL HIGH (ref 101–111)
Creatinine, Ser: 1.14 mg/dL — ABNORMAL HIGH (ref 0.44–1.00)
Creatinine, Ser: 0.66 mg/dL (ref 0.44–1.00)
GFR calc Af Amer: >60 mL/min (ref >60)
GFR calc non Af Amer: >60 mL/min (ref >60)
Glucose, Bld: 1062 mg/dL (ref 65–99)
Glucose, Bld: 213 mg/dL — ABNORMAL HIGH (ref 65–99)
Potassium: 4.4 mmol/L (ref 3.5–5.1)
Potassium: 3.7 mmol/L (ref 3.5–5.1)
Sodium: 119 mmol/L — CL (ref 135–145)
Sodium: 136 mmol/L (ref 135–145)

## 2015-11-24 LAB — TROPONIN I: Troponin I: <0.03 ng/mL (ref <0.03)

## 2015-11-24 LAB — URINE MICROSCOPIC-ADD ON: RBC / HPF: NONE SEEN RBC/hpf (ref 0–5)

## 2015-11-24 LAB — GLUCOSE, CAPILLARY
GLUCOSE-CAPILLARY: 457 mg/dL — AB (ref 65–99)
Glucose-Capillary: 277 mg/dL — ABNORMAL HIGH (ref 65–99)
Glucose-Capillary: 343 mg/dL — ABNORMAL HIGH (ref 65–99)
Glucose-Capillary: 190 mg/dL — ABNORMAL HIGH (ref 65–99)
Glucose-Capillary: 184 mg/dL — ABNORMAL HIGH (ref 65–99)

## 2015-11-24 LAB — POCT URINALYSIS DIPSTICK
Bilirubin, UA: NEGATIVE
Glucose, UA: 500
Ketones, UA: 80
Leukocytes, UA: NEGATIVE
Nitrite, UA: NEGATIVE
PH UA: 5.5
Protein, UA: NEGATIVE
Spec Grav, UA: <=1.005
Urobilinogen, UA: 0.2

## 2015-11-24 LAB — URINALYSIS, ROUTINE W REFLEX MICROSCOPIC
Bilirubin Urine: NEGATIVE
Hgb urine dipstick: NEGATIVE
Ketones, ur: >80 mg/dL — AB
LEUKOCYTES UA: NEGATIVE
Nitrite: NEGATIVE
PH: 5.5 (ref 5.0–8.0)
Protein, ur: NEGATIVE mg/dL
SPECIFIC GRAVITY, URINE: 1.033 — AB (ref 1.005–1.030)

## 2015-11-24 LAB — GLUCOSE, POCT (MANUAL RESULT ENTRY)

## 2015-11-24 LAB — CBG MONITORING, ED: Glucose-Capillary: >600 mg/dL (ref 65–99)

## 2015-11-24 LAB — I-STAT BETA HCG BLOOD, ED (MC, WL, AP ONLY)

## 2015-11-24 LAB — POCT GLYCOSYLATED HEMOGLOBIN (HGB A1C)

## 2015-11-24 MED ORDER — DEXTROSE-NACL 5-0.45 % IV SOLN: INTRAVENOUS | Status: DC

## 2015-11-24 MED ORDER — DEXTROSE-NACL 5-0.45 % IV SOLN
INTRAVENOUS | Status: DC
  Administered 2015-11-24: 1000 mL via INTRAVENOUS

## 2015-11-24 MED ORDER — SODIUM CHLORIDE 0.9 % IV BOLUS (SEPSIS): 1000.0000 mL | Freq: Once | INTRAVENOUS | Status: DC

## 2015-11-24 MED ORDER — SODIUM CHLORIDE 0.9 % IV SOLN: INTRAVENOUS | Status: DC

## 2015-11-24 MED ORDER — SODIUM CHLORIDE 0.9 % IV SOLN
INTRAVENOUS | Status: DC
  Administered 2015-11-24: 5.4 [IU]/h via INTRAVENOUS
  Filled 2015-11-24: qty 2.5

## 2015-11-24 MED ORDER — SODIUM CHLORIDE 0.9 % IV SOLN
INTRAVENOUS | Status: DC
  Administered 2015-11-24: 15:00:00 via INTRAVENOUS

## 2015-11-24 MED ORDER — SODIUM CHLORIDE 0.9 % IV BOLUS (SEPSIS)
1000.0000 mL | Freq: Once | INTRAVENOUS | Status: AC
  Administered 2015-11-24: 1000 mL via INTRAVENOUS

## 2015-11-24 MED ORDER — ACETAMINOPHEN 325 MG PO TABS: 650.0000 mg | ORAL_TABLET | Freq: Four times a day (QID) | ORAL | Status: DC | PRN

## 2015-11-24 MED ORDER — SODIUM CHLORIDE 0.9 % IV SOLN
INTRAVENOUS | Status: DC
  Filled 2015-11-24: qty 2.5

## 2015-11-24 MED ORDER — POTASSIUM CHLORIDE 10 MEQ/100ML IV SOLN
10.0000 meq | INTRAVENOUS | Status: AC
  Administered 2015-11-24: 10 meq via INTRAVENOUS
  Filled 2015-11-24: qty 100

## 2015-11-24 MED ORDER — TRAMADOL HCL 50 MG PO TABS
50.0000 mg | ORAL_TABLET | Freq: Four times a day (QID) | ORAL | Status: DC | PRN
  Administered 2015-11-24: 50 mg via ORAL
  Filled 2015-11-24: qty 1

## 2015-11-24 MED ORDER — METRONIDAZOLE IN NACL 5-0.79 MG/ML-% IV SOLN
500.0000 mg | Freq: Three times a day (TID) | INTRAVENOUS | Status: DC
  Administered 2015-11-24 – 2015-11-26 (×4): 500 mg via INTRAVENOUS
  Filled 2015-11-24 (×5): qty 100

## 2015-11-24 MED ORDER — POTASSIUM CHLORIDE CRYS ER 20 MEQ PO TBCR
20.0000 meq | EXTENDED_RELEASE_TABLET | Freq: Once | ORAL | Status: AC
Start: 2015-11-24 — End: 2015-11-24
  Administered 2015-11-24: 20 meq via ORAL
  Filled 2015-11-24: qty 1

## 2015-11-24 MED ORDER — ENOXAPARIN SODIUM 40 MG/0.4ML ~~LOC~~ SOLN
40.0000 mg | SUBCUTANEOUS | Status: DC
  Filled 2015-11-24 (×2): qty 0.4

## 2015-11-24 NOTE — ED Notes (Signed)
Lab called for critical lab, Sodium 119 glucose 1062, dr Regenia Skeeter notified

## 2015-11-24 NOTE — H&P (Signed)
History and Physical    Ashley Freeman [id number] DOB: 22-Sep-1990 DOA: 11/24/2015  PCP: No primary care provider on file. Patient coming from: home/clinic  Chief Complaint: hyperglycemia  HPI: Ashley Freeman is a pleasant 25 y.o. female with medical history significant for type 1 diabetes presents to the emergency Department chief complaint of hyperglycemia. Initial evaluation reveals DKA  Information is obtained from the patient and chart. She went to [facility] for her scheduled appointment was sent to the ED due to hyperglycemia as well as ketones in her urine and hyponatremia. She was in the emergency department 4 days ago for a fall and diagnosed with BV at that time. She reports not having the money to fill metronidazole. She reports feeling very thirsty and frequent urination over the last several days. She also reports a continued itch and vaginal discharge. She denies dysuria hematuria. She denies any abdominal pain nausea vomiting. She denies headache visual disturbances syncope or near-syncope. She reports she has tried increased oral intake. She reports running out of her long acting insulin as she was trying to "make it last". He states she's been taken her short acting as prescribed. She denies chest pain palpitation shortness of breath.    ED Course: In the emergency department she is provided with 2 L of normal saline. She is afebrile hemodynamically stable and not hypoxic.  Review of Systems: As per HPI otherwise 10 point review of systems negative.   Ambulatory Status: She amylase independently with a steady gait is independent with ADLs.  Past Medical History:  Diagnosis Date  . Diabetes mellitus 2001   Diagnosed at age 16 ; Type I  . DKA (diabetic ketoacidoses) ([place]) 08/19/2013  . Gonorrhea 08/2011   Treated in 09/2011    Past Surgical History:  Procedure Laterality Date  . INCISION AND DRAINAGE PERIRECTAL ABSCESS Right 08/18/2013   Procedure: IRRIGATION AND DEBRIDEMENT GLUTEAL ABSCESS;  Surgeon: Ralene Ok, MD;  Location: [facility];  Service: General;  Laterality: Right;  . INCISION AND DRAINAGE PERIRECTAL ABSCESS Right 09/19/2013   Procedure: IRRIGATION AND DEBRIDEMENT RIGHT GLUTEAL AND LABIAL ABSCESSES;  Surgeon: Ralene Ok, MD;  Location: [facility];  Service: General;  Laterality: Right;  . INCISION AND DRAINAGE PERIRECTAL ABSCESS Right 09/24/2013   Procedure: IRRIGATION AND DEBRIDEMENT PERIRECTAL ABSCESS;  Surgeon: Gwenyth Ober, MD;  Location: [facility];  Service: General;  Laterality: Right;  . NO PAST SURGERIES      Social History   Social History  . Marital status: Single    Spouse name: N/A  . Number of children: 0  . Years of education: 11th grade   Occupational History  . unemployed     has never worked   Social History Main Topics  . Smoking status: Never Smoker  . Smokeless tobacco: Never Used  . Alcohol use Yes     Comment: occ  . Drug use: No  . Sexual activity: Yes    Birth control/ protection: None     Comment: As of 11/06/11 1 partner (female) not using protection   Other Topics Concern  . Not on file   Social History Narrative   Patient lives in [place] mother lives in [place].  Unemployed.  Previously worked for a IT consultant.  Completed 11 grade working on [employer]. Patient 3 brothers     Allergies  Allergen Reactions  . Penicillins Hives    Childhood allergic reaction Has patient had a PCN reaction causing immediate rash, facial/tongue/throat  swelling, SOB or lightheadedness with hypotension: Yes Has patient had a PCN reaction causing severe rash involving mucus membranes or skin necrosis: No Has patient had a PCN reaction that required hospitalization: unknown Has patient had a PCN reaction occurring within the last 10 years: No If all of the above answers are "NO", then may proceed with Cephalosporin use.    Family History  Problem Relation Age of Onset  . Asthma Mother     . Gout Father   . Diabetes Paternal Grandmother   . Anesthesia problems Neg Hx     Prior to Admission medications   Medication Sig Start Date End Date Taking? Authorizing Provider  ibuprofen (ADVIL,MOTRIN) 600 MG tablet Take 600 mg by mouth every 6 (six) hours as needed (pain).   Yes Historical Provider, MD  insulin aspart (NOVOLOG) 100 UNIT/ML injection Inject 5-10 Units into the skin 2 (two) times daily with a meal. Per sliding scale 06/20/15  Yes Clayton Bibles, PA-C  insulin glargine (LANTUS) 100 UNIT/ML injection Inject 0.2 mLs (20 Units total) into the skin 2 (two) times daily with a meal. 11/13/15  Yes Margette Fast, MD  Insulin Syringes, Disposable, U-100 0.3 ML MISC 1 Syringe by Does not apply route 3 (three) times daily. 06/20/15  Yes Clayton Bibles, PA-C  metroNIDAZOLE (FLAGYL) 500 MG tablet Take 1 tablet (500 mg total) by mouth 2 (two) times daily. Patient not taking: Reported on 11/24/2015 11/20/15   Courteney Lyn Mackuen, MD    Physical Exam: Vitals:   11/24/15 1253 11/24/15 1302 11/24/15 1348 11/24/15 1419  BP: 105/73 113/74 113/77 118/80  Pulse: 120 112 102 107  Resp: 18 16 16 16   Temp: 98.1 F (36.7 C) 98.2 F (36.8 C)    TempSrc: Oral Oral    SpO2: 100%  100% 100%     General:  Appears calm and comfortable, no acute distress Eyes:  PERRL, EOMI, normal lids, iris ENT:  grossly normal hearing, lips & tongue, mucous membranes of her mouth are pink but dry Neck:  no LAD, masses or thyromegaly Cardiovascular:  Tachycardic but regular no m/r/g. No LE edema.  Respiratory:  CTA bilaterally, no w/r/r. Normal respiratory effort. Abdomen:  soft, ntnd, bowel sounds very sluggish no guarding or rebounding Skin:  no rash or induration seen on limited exam Musculoskeletal:  grossly normal tone BUE/BLE, good ROM, no bony abnormality Psychiatric:  grossly normal mood and affect, speech fluent and appropriate, AOx3 Neurologic:  CN 2-12 grossly intact, moves all extremities in coordinated  fashion, sensation intact  Labs on Admission: I have personally reviewed following labs and imaging studies  CBC:  Recent Labs Lab 11/24/15 1250  WBC 6.0  HGB 12.2  HCT 39.2  MCV 90.1  PLT [id number]   Basic Metabolic Panel:  Recent Labs Lab 11/24/15 1250  NA 119*  K 4.4  CL 88*  CO2 12*  GLUCOSE 1,062*  BUN 10  CREATININE 1.14*  CALCIUM 8.5*   GFR: Estimated Creatinine Clearance: 62.6 mL/min (by C-G formula based on SCr of 1.14 mg/dL). Liver Function Tests: No results for input(s): AST, ALT, ALKPHOS, BILITOT, PROT, ALBUMIN in the last 168 hours. No results for input(s): LIPASE, AMYLASE in the last 168 hours. No results for input(s): AMMONIA in the last 168 hours. Coagulation Profile: No results for input(s): INR, PROTIME in the last 168 hours. Cardiac Enzymes: No results for input(s): CKTOTAL, CKMB, CKMBINDEX, TROPONINI in the last 168 hours. BNP (last 3 results) No results for input(s): PROBNP in  the last 8760 hours. HbA1C: No results for input(s): HGBA1C in the last 72 hours. CBG:  Recent Labs Lab 11/20/15 0905 11/24/15 1253  GLUCAP 487* >600*   Lipid Profile: No results for input(s): CHOL, HDL, LDLCALC, TRIG, CHOLHDL, LDLDIRECT in the last 72 hours. Thyroid Function Tests: No results for input(s): TSH, T4TOTAL, FREET4, T3FREE, THYROIDAB in the last 72 hours. Anemia Panel: No results for input(s): VITAMINB12, FOLATE, FERRITIN, TIBC, IRON, RETICCTPCT in the last 72 hours. Urine analysis:    Component Value Date/Time   COLORURINE YELLOW 11/24/2015 1415   APPEARANCEUR CLOUDY (A) 11/24/2015 1415   APPEARANCEUR Hazy 11/10/2013 2043   LABSPEC 1.033 (H) 11/24/2015 1415   LABSPEC 1.031 11/10/2013 2043   PHURINE 5.5 11/24/2015 1415   GLUCOSEU >1000 (A) 11/24/2015 1415   GLUCOSEU >=500 11/10/2013 2043   HGBUR NEGATIVE 11/24/2015 1415   BILIRUBINUR Negative 11/24/2015 1443   BILIRUBINUR Negative 11/10/2013 2043   KETONESUR >80 (A) 11/24/2015 1415   PROTEINUR  Negative 11/24/2015 1443   PROTEINUR NEGATIVE 11/24/2015 1415   UROBILINOGEN 0.2 11/24/2015 1443   UROBILINOGEN 1.0 03/12/2015 2039   NITRITE Negative 11/24/2015 1443   NITRITE NEGATIVE 11/24/2015 1415   LEUKOCYTESUR Negative 11/24/2015 1443   LEUKOCYTESUR 1+ 11/10/2013 2043    Creatinine Clearance: Estimated Creatinine Clearance: 62.6 mL/min (by C-G formula based on SCr of 1.14 mg/dL).  Sepsis Labs: @LABRCNTIP (procalcitonin:4,lacticidven:4) ) Recent Results (from the past 240 hour(s))  Urine culture     Status: Abnormal   Collection Time: 11/20/15  9:05 AM  Result Value Ref Range Status   Specimen Description URINE, RANDOM  Final   Special Requests NONE  Final   Culture (A)  Final    80,000 COLONIES/mL GROUP B STREP(S.AGALACTIAE)ISOLATED TESTING AGAINST S. AGALACTIAE NOT ROUTINELY PERFORMED DUE TO PREDICTABILITY OF AMP/PEN/VAN SUSCEPTIBILITY.    Report Status 11/21/2015 FINAL  Final  Wet prep, genital     Status: Abnormal   Collection Time: 11/20/15 10:20 AM  Result Value Ref Range Status   Yeast Wet Prep HPF POC NONE SEEN NONE SEEN Final   Trich, Wet Prep NONE SEEN NONE SEEN Final   Clue Cells Wet Prep HPF POC PRESENT (A) NONE SEEN Final   WBC, Wet Prep HPF POC FEW (A) NONE SEEN Final   Sperm NONE SEEN  Final     Radiological Exams on Admission: No results found.  EKG:   Assessment/Plan Principal Problem:   DKA, type 1 (HCC) Active Problems:   Diabetes mellitus type 1 (uncontrolled)   Sinus tachycardia (HCC)   Hyperglycemia   Hyponatremia   DKA (diabetic ketoacidoses) (H[facility])   AKI (acute kidney injury) ([place])   BV (bacterial vaginosis)   Chest pain   #1. DKA. Likely related to noncompliance secondary to financial constraints swell as recently diagnosed BV. Serum glucose 1062 on admission. Gap 19. Sodium 119, chloride 88 CO2 12. She is provided with 2 L normal saline. Home regimen includes Lantus 20 units twice a day swell as sliding scale twice a day with  meals. -Admit to step down -bolus 1 more liter NS -Insulin drip per glucomander protocol -10 mEq of IV potassium x2 per protocol -BMET q 4 hours x5 -npo for now -Advance diet once serum glucose controlled -transition fluids as indicated -transition to Lantus per protocol -diabetes coordinator  #2. Acute kidney injury. Mild. Likely related to dehydration in setting of #1. Creatinine 1.14 on admission -Vigorous IV fluids -Hold nephrotoxins -Monitor urine output -Recheck in the morning  #3. Hyponatremia. Related  to #1. Will likely correct itself with hydration and correction of hyperglycemia -Bemet as noted above  #4. BV. Diagnosed in the emergency department 4 days ago. Unable to fill prescription. Symptoms continue -flagyl IV for now given nothing by mouth status secondary to #1  5. Sinus tachycardia. Likely related to dehydration in setting to #1. Fluids as noted above.  Improving at the point of admission -Monitor -EKG  #6. Chest pain. Developed  late in her stay in the emergency department. Reproducible. Likely musculoskeletal -Obtain an EKG -Cycle troponins    DVT prophylaxis: scd Code Status: full  Family Communication: none  Disposition Plan: home  Consults called: none  Admission status: inpatient    Radene Gunning MD [facility]  If 7PM-7AM, please contact night-coverage [email or web address] Password [email or web address]  11/24/2015, 3:02 PM

## 2015-11-24 NOTE — ED Provider Notes (Signed)
[facility] DEPT Provider Note   CSN: [id number] Arrival date & time: 11/24/15  1245  First Provider Contact:  First MD Initiated Contact with Patient 11/24/15 1319        History   Chief Complaint Chief Complaint  Patient presents with  . Hyperglycemia    HPI Ashley Freeman is a 25 y.o. female presenting with hyperglycemia. Patient is a type I diabetic. She went to the [facility] for an appointment today and was sent here because her glucose was high and there were ketones in her urine and her sodium was low. Patient was here 4 days ago for a fall and diagnosed with BV, but has not had the money to fill her metronidazole. Since then she has continued to have itching and discharge in her vagina but no dysuria. Mild lower abdominal pain. Over the last 2 days has been feeling thirsty, increased oral intake, and increased urination. Does not feel as bad as she has been when she was in DKA in the past. Has been taking her short-acting insulin, most recently at 2 AM, but has been out of her long-acting insulin for at least 2 days.  HPI  Past Medical History:  Diagnosis Date  . Diabetes mellitus 2001   Diagnosed at age 65 ; Type I  . DKA (diabetic ketoacidoses) ([place]) 08/19/2013  . Gonorrhea 08/2011   Treated in 09/2011    Patient Active Problem List   Diagnosis Date Noted  . DKA, type 1 ([place]) 11/24/2015  . AKI (acute kidney injury) ([place]) 07/26/2014  . Infection of urinary tract 04/08/2014  . PID (acute pelvic inflammatory disease) 04/06/2014  . DKA (diabetic ketoacidoses) ([place]) 09/19/2013  . Infected cyst of Bartholin's gland duct 09/19/2013  . Bartholin's gland abscess 09/19/2013  . Sepsis ([place]) 09/19/2013  . Abscess, gluteal, right 08/24/2013  . Health care maintenance 08/24/2013  . Hypophosphatemia 08/18/2013  . Hypomagnesemia 08/18/2013  . Leukocytosis, unspecified 08/17/2013  . Fever, unspecified 08/17/2013  . H/O medication noncompliance  08/12/2013  . Hyponatremia 08/04/2012  . Anemia 02/19/2012  . Hyperglycemia 11/06/2011  . Diabetes mellitus type 1 (uncontrolled) 10/13/1997    Past Surgical History:  Procedure Laterality Date  . INCISION AND DRAINAGE PERIRECTAL ABSCESS Right 08/18/2013   Procedure: IRRIGATION AND DEBRIDEMENT GLUTEAL ABSCESS;  Surgeon: Ralene Ok, MD;  Location: [facility];  Service: General;  Laterality: Right;  . INCISION AND DRAINAGE PERIRECTAL ABSCESS Right 09/19/2013   Procedure: IRRIGATION AND DEBRIDEMENT RIGHT GLUTEAL AND LABIAL ABSCESSES;  Surgeon: Ralene Ok, MD;  Location: [place] [facility];  Service: General;  Laterality: Right;  . INCISION AND DRAINAGE PERIRECTAL ABSCESS Right 09/24/2013   Procedure: IRRIGATION AND DEBRIDEMENT PERIRECTAL ABSCESS;  Surgeon: Gwenyth Ober, MD;  Location: [facility];  Service: General;  Laterality: Right;  . NO PAST SURGERIES      OB History    Gravida Para Term Preterm AB Living   1 0     1 0   SAB TAB Ectopic Multiple Live Births   1               Home Medications    Prior to Admission medications   Medication Sig Start Date End Date Taking? Authorizing Provider  ibuprofen (ADVIL,MOTRIN) 600 MG tablet Take 600 mg by mouth every 6 (six) hours as needed (pain).   Yes Historical Provider, MD  insulin aspart (NOVOLOG) 100 UNIT/ML injection Inject 5-10 Units into the skin 2 (two) times daily with a meal. Per sliding  scale 06/20/15  Yes Clayton Bibles, PA-C  insulin glargine (LANTUS) 100 UNIT/ML injection Inject 0.2 mLs (20 Units total) into the skin 2 (two) times daily with a meal. 11/13/15  Yes Margette Fast, MD  Insulin Syringes, Disposable, U-100 0.3 ML MISC 1 Syringe by Does not apply route 3 (three) times daily. 06/20/15  Yes Clayton Bibles, PA-C  metroNIDAZOLE (FLAGYL) 500 MG tablet Take 1 tablet (500 mg total) by mouth 2 (two) times daily. Patient not taking: Reported on 11/24/2015 11/20/15   Courteney Lyn Mackuen, MD    Family History Family History  Problem Relation Age  of Onset  . Asthma Mother   . Gout Father   . Diabetes Paternal Grandmother   . Anesthesia problems Neg Hx     Social History Social History  Substance Use Topics  . Smoking status: Never Smoker  . Smokeless tobacco: Never Used  . Alcohol use Yes     Comment: occ     Allergies   Penicillins   Review of Systems Review of Systems  Gastrointestinal: Positive for abdominal pain. Negative for vomiting.  Endocrine: Positive for polydipsia and polyuria.  Genitourinary: Positive for vaginal discharge. Negative for dysuria.  All other systems reviewed and are negative.    Physical Exam Updated Vital Signs BP 118/80   Pulse 107   Temp 98.2 F (36.8 C) (Oral)   Resp 16   SpO2 100%   Physical Exam  Constitutional: She is oriented to person, place, and time. She appears well-developed and well-nourished. No distress.  HENT:  Head: Normocephalic and atraumatic.  Right Ear: External ear normal.  Left Ear: External ear normal.  Nose: Nose normal.  Mouth/Throat: Mucous membranes are dry.  Eyes: Right eye exhibits no discharge. Left eye exhibits no discharge.  Cardiovascular: Regular rhythm and normal heart sounds.  Tachycardia present.   Pulmonary/Chest: Effort normal and breath sounds normal.  Abdominal: Soft. She exhibits no distension. There is no tenderness.  Neurological: She is alert and oriented to person, place, and time.  Skin: Skin is warm and dry. She is not diaphoretic.  Nursing note and vitals reviewed.    ED Treatments / Results  Labs (all labs ordered are listed, but only abnormal results are displayed) Labs Reviewed  BASIC METABOLIC PANEL - Abnormal; Notable for the following:       Result Value   Sodium 119 (*)    Chloride 88 (*)    CO2 12 (*)    Glucose, Bld 1,062 (*)    Creatinine, Ser 1.14 (*)    Calcium 8.5 (*)    Anion gap 19 (*)    All other components within normal limits  CBG MONITORING, ED - Abnormal; Notable for the following:     Glucose-Capillary >600 (*)    All other components within normal limits  I-STAT VENOUS BLOOD GAS, ED - Abnormal; Notable for the following:    pH, Ven 7.212 (*)    pCO2, Ven 31.2 (*)    pO2, Ven 50.0 (*)    Bicarbonate 12.5 (*)    Acid-base deficit 14.0 (*)    All other components within normal limits  CBC  URINALYSIS, ROUTINE W REFLEX MICROSCOPIC (NOT AT [facility])  BASIC METABOLIC PANEL  BASIC METABOLIC PANEL  BASIC METABOLIC PANEL  BASIC METABOLIC PANEL  CBC  URINE RAPID DRUG SCREEN, HOSP PERFORMED  I-STAT BETA HCG BLOOD, ED ([facility], [facility], [facility] ONLY)    EKG  EKG Interpretation None       Radiology  No results found.  Procedures Procedures (including critical care time)  Medications Ordered in ED Medications  sodium chloride 0.9 % bolus 1,000 mL (1,000 mLs Intravenous New Bag/Given 11/24/15 1349)    And  sodium chloride 0.9 % bolus 1,000 mL (not administered)    And  0.9 %  sodium chloride infusion (not administered)  dextrose 5 %-0.45 % sodium chloride infusion (not administered)  insulin regular (NOVOLIN R,HUMULIN R) 250 Units in sodium chloride 0.9 % 250 mL (1 Units/mL) infusion (not administered)  0.9 %  sodium chloride infusion (not administered)  dextrose 5 %-0.45 % sodium chloride infusion (not administered)  enoxaparin (LOVENOX) injection 40 mg (not administered)  potassium chloride 10 mEq in 100 mL IVPB (not administered)  metroNIDAZOLE (FLAGYL) IVPB 500 mg (not administered)  sodium chloride 0.9 % bolus 1,000 mL (not administered)     Initial Impression / Assessment and Plan / ED Course  I have reviewed the triage vital signs and the nursing notes.  Pertinent labs & imaging results that were available during my care of the patient were reviewed by me and considered in my medical decision making (see chart for details).  Clinical Course  Comment By Time  CBG over 600, VBG shows metabolic acidosis. Will give IV fluids, appears to be in DKA but not ill appearing or  distressed. Will wait on BMP before starting insulin. Sherwood Gambler, MD 07/27 1319  Labs c/w DKA. D/w Dyanne Carrel, will admit to stepdown. Will order insulin and give after fluids. Sherwood Gambler, MD 07/27 1411    Final Clinical Impressions(s) / ED Diagnoses   Final diagnoses:  Diabetic ketoacidosis without coma associated with type 1 diabetes mellitus ([place])    New Prescriptions New Prescriptions   No medications on file     Sherwood Gambler, MD 11/24/15 1446

## 2015-11-24 NOTE — ED Triage Notes (Signed)
[facility]. Reports hyperglycemia and possible DKA, has been out of her long acting insulin for several days. Pt reports fatigue and freq urination.

## 2015-11-24 NOTE — Addendum Note (Signed)
Addended by: Jackelyn Knife on: 11/24/2015 02:49 PM   Modules accepted: Orders

## 2015-11-24 NOTE — Progress Notes (Signed)
Patient ID: Ashley Freeman, female   DOB: 1990/06/01, 25 y.o.   MRN: [id number] S/O:Recently seen in ED for a fall.  Type 1 diabetes.  Sent here for f/up.  Pre-assessment-CBG too high to read on glucometer.  A1C also unreadable. 80 ketones in urine  C/o fatigue, polydipsia/polyuria and fatigue. NAD.  Not toxic-appearing. Pulse 96 R 16   A/P:  Type 1 Diabetes-uncontrolled-electrolyte imbalance on recent labs. Likely DKA.  To ED now for E& for DKA.

## 2015-11-24 NOTE — Patient Instructions (Signed)
To ED now 

## 2015-11-25 DIAGNOSIS — N179 Acute kidney failure, unspecified: Secondary | ICD-10-CM

## 2015-11-25 DIAGNOSIS — N760 Acute vaginitis: Secondary | ICD-10-CM

## 2015-11-25 DIAGNOSIS — R000 Tachycardia, unspecified: Secondary | ICD-10-CM

## 2015-11-25 DIAGNOSIS — E871 Hypo-osmolality and hyponatremia: Secondary | ICD-10-CM

## 2015-11-25 DIAGNOSIS — A499 Bacterial infection, unspecified: Secondary | ICD-10-CM

## 2015-11-25 DIAGNOSIS — E108 Type 1 diabetes mellitus with unspecified complications: Secondary | ICD-10-CM

## 2015-11-25 DIAGNOSIS — E1065 Type 1 diabetes mellitus with hyperglycemia: Secondary | ICD-10-CM

## 2015-11-25 DIAGNOSIS — E1010 Type 1 diabetes mellitus with ketoacidosis without coma: Principal | ICD-10-CM

## 2015-11-25 LAB — BASIC METABOLIC PANEL
Anion gap: 9 (ref 5–15)
Anion gap: 4 — ABNORMAL LOW (ref 5–15)
Anion gap: 4 — ABNORMAL LOW (ref 5–15)
Anion gap: 9 (ref 5–15)
Anion gap: 5 (ref 5–15)
Anion gap: 7 (ref 5–15)
BUN: <5 mg/dL — ABNORMAL LOW (ref 6–20)
BUN: <5 mg/dL — ABNORMAL LOW (ref 6–20)
BUN: <5 mg/dL — ABNORMAL LOW (ref 6–20)
CALCIUM: 8 mg/dL — AB (ref 8.9–10.3)
CALCIUM: 8.2 mg/dL — AB (ref 8.9–10.3)
CALCIUM: 8.7 mg/dL — AB (ref 8.9–10.3)
CALCIUM: 8.3 mg/dL — AB (ref 8.9–10.3)
CALCIUM: 8 mg/dL — AB (ref 8.9–10.3)
CHLORIDE: 105 mmol/L (ref 101–111)
CHLORIDE: 112 mmol/L — AB (ref 101–111)
CHLORIDE: 106 mmol/L (ref 101–111)
CHLORIDE: 109 mmol/L (ref 101–111)
CO2: 17 mmol/L — ABNORMAL LOW (ref 22–32)
CO2: 19 mmol/L — AB (ref 22–32)
CO2: 19 mmol/L — AB (ref 22–32)
CO2: 18 mmol/L — AB (ref 22–32)
CO2: 19 mmol/L — AB (ref 22–32)
CO2: 18 mmol/L — AB (ref 22–32)
CREATININE: 0.43 mg/dL — AB (ref 0.44–1.00)
CREATININE: 0.61 mg/dL (ref 0.44–1.00)
CREATININE: 0.54 mg/dL (ref 0.44–1.00)
CREATININE: 0.68 mg/dL (ref 0.44–1.00)
Calcium: 8.2 mg/dL — ABNORMAL LOW (ref 8.9–10.3)
Chloride: 112 mmol/L — ABNORMAL HIGH (ref 101–111)
Chloride: 106 mmol/L (ref 101–111)
Creatinine, Ser: 0.56 mg/dL (ref 0.44–1.00)
Creatinine, Ser: 0.45 mg/dL (ref 0.44–1.00)
GFR calc Af Amer: >60 mL/min (ref >60)
GFR calc Af Amer: >60 mL/min (ref >60)
GFR calc Af Amer: >60 mL/min (ref >60)
GFR calc Af Amer: >60 mL/min (ref >60)
GFR calc non Af Amer: >60 mL/min (ref >60)
GFR calc non Af Amer: >60 mL/min (ref >60)
GFR calc non Af Amer: >60 mL/min (ref >60)
GFR calc non Af Amer: >60 mL/min (ref >60)
GFR calc non Af Amer: >60 mL/min (ref >60)
GFR calc non Af Amer: >60 mL/min (ref >60)
GLUCOSE: 77 mg/dL (ref 65–99)
GLUCOSE: 199 mg/dL — AB (ref 65–99)
GLUCOSE: 192 mg/dL — AB (ref 65–99)
GLUCOSE: 305 mg/dL — AB (ref 65–99)
Glucose, Bld: 240 mg/dL — ABNORMAL HIGH (ref 65–99)
Glucose, Bld: 110 mg/dL — ABNORMAL HIGH (ref 65–99)
POTASSIUM: 4.1 mmol/L (ref 3.5–5.1)
Potassium: 3.9 mmol/L (ref 3.5–5.1)
Potassium: 3.6 mmol/L (ref 3.5–5.1)
Potassium: 3.8 mmol/L (ref 3.5–5.1)
Potassium: 3.4 mmol/L — ABNORMAL LOW (ref 3.5–5.1)
Potassium: 3.6 mmol/L (ref 3.5–5.1)
Sodium: 131 mmol/L — ABNORMAL LOW (ref 135–145)
Sodium: 135 mmol/L (ref 135–145)
Sodium: 135 mmol/L (ref 135–145)
Sodium: 133 mmol/L — ABNORMAL LOW (ref 135–145)
Sodium: 133 mmol/L — ABNORMAL LOW (ref 135–145)
Sodium: 131 mmol/L — ABNORMAL LOW (ref 135–145)

## 2015-11-25 LAB — GLUCOSE, CAPILLARY
GLUCOSE-CAPILLARY: 205 mg/dL — AB (ref 65–99)
GLUCOSE-CAPILLARY: 222 mg/dL — AB (ref 65–99)
GLUCOSE-CAPILLARY: 202 mg/dL — AB (ref 65–99)
GLUCOSE-CAPILLARY: 117 mg/dL — AB (ref 65–99)
GLUCOSE-CAPILLARY: 104 mg/dL — AB (ref 65–99)
GLUCOSE-CAPILLARY: 220 mg/dL — AB (ref 65–99)
GLUCOSE-CAPILLARY: 264 mg/dL — AB (ref 65–99)
Glucose-Capillary: 106 mg/dL — ABNORMAL HIGH (ref 65–99)
Glucose-Capillary: 132 mg/dL — ABNORMAL HIGH (ref 65–99)
Glucose-Capillary: 135 mg/dL — ABNORMAL HIGH (ref 65–99)
Glucose-Capillary: 75 mg/dL (ref 65–99)
Glucose-Capillary: 135 mg/dL — ABNORMAL HIGH (ref 65–99)
Glucose-Capillary: 185 mg/dL — ABNORMAL HIGH (ref 65–99)
Glucose-Capillary: 214 mg/dL — ABNORMAL HIGH (ref 65–99)
Glucose-Capillary: 190 mg/dL — ABNORMAL HIGH (ref 65–99)
Glucose-Capillary: 128 mg/dL — ABNORMAL HIGH (ref 65–99)
Glucose-Capillary: 103 mg/dL — ABNORMAL HIGH (ref 65–99)
Glucose-Capillary: 199 mg/dL — ABNORMAL HIGH (ref 65–99)
Glucose-Capillary: 206 mg/dL — ABNORMAL HIGH (ref 65–99)

## 2015-11-25 LAB — RAPID URINE DRUG SCREEN, HOSP PERFORMED
Amphetamines: NOT DETECTED
Barbiturates: NOT DETECTED
Benzodiazepines: NOT DETECTED
Cocaine: NOT DETECTED
Opiates: NOT DETECTED
Tetrahydrocannabinol: NOT DETECTED

## 2015-11-25 LAB — CBC
HEMATOCRIT: 33.6 % — AB (ref 36.0–46.0)
Hemoglobin: 11 g/dL — ABNORMAL LOW (ref 12.0–15.0)
MCH: 26.8 pg (ref 26.0–34.0)
MCHC: 32.7 g/dL (ref 30.0–36.0)
MCV: 82 fL (ref 78.0–100.0)
Platelets: 278 10*3/uL (ref 150–400)
RBC: 4.1 MIL/uL (ref 3.87–5.11)
RDW: 13.2 % (ref 11.5–15.5)
WBC: 8.3 10*3/uL (ref 4.0–10.5)

## 2015-11-25 LAB — TROPONIN I: Troponin I: <0.03 ng/mL (ref <0.03)

## 2015-11-25 MED ORDER — SODIUM CHLORIDE 0.9 % IV BOLUS (SEPSIS): 500.0000 mL | Freq: Once | INTRAVENOUS | Status: DC

## 2015-11-25 MED ORDER — INSULIN ASPART 100 UNIT/ML FLEXPEN: 5.0000 [IU] | PEN_INJECTOR | Freq: Three times a day (TID) | SUBCUTANEOUS | 11 refills | Status: DC

## 2015-11-25 MED ORDER — DEXTROSE 50 % IV SOLN
25.0000 mL | Freq: Once | INTRAVENOUS | Status: AC
  Administered 2015-11-25: 25 mL via INTRAVENOUS
  Filled 2015-11-25: qty 50

## 2015-11-25 MED ORDER — INSULIN ASPART 100 UNIT/ML ~~LOC~~ SOLN
0.0000 [IU] | SUBCUTANEOUS | Status: DC
  Administered 2015-11-25: 11 [IU] via SUBCUTANEOUS
  Administered 2015-11-26: 7 [IU] via SUBCUTANEOUS
  Administered 2015-11-26: 11 [IU] via SUBCUTANEOUS
  Administered 2015-11-26: 7 [IU] via SUBCUTANEOUS
  Administered 2015-11-26 (×2): 3 [IU] via SUBCUTANEOUS
  Administered 2015-11-27: 11 [IU] via SUBCUTANEOUS
  Administered 2015-11-27: 3 [IU] via SUBCUTANEOUS

## 2015-11-25 MED ORDER — POTASSIUM CHLORIDE CRYS ER 20 MEQ PO TBCR
20.0000 meq | EXTENDED_RELEASE_TABLET | Freq: Once | ORAL | Status: AC
  Administered 2015-11-25: 20 meq via ORAL
  Filled 2015-11-25: qty 1

## 2015-11-25 MED ORDER — INSULIN PEN NEEDLE 30G X 8 MM MISC: 20.0000 | Status: DC | PRN

## 2015-11-25 MED ORDER — INSULIN GLARGINE 100 UNIT/ML SOLOSTAR PEN: 20.0000 [IU] | PEN_INJECTOR | Freq: Two times a day (BID) | SUBCUTANEOUS | 11 refills | Status: DC

## 2015-11-25 MED ORDER — INSULIN GLARGINE 100 UNIT/ML ~~LOC~~ SOLN
10.0000 [IU] | Freq: Every day | SUBCUTANEOUS | Status: DC
  Administered 2015-11-25 – 2015-11-26 (×2): 10 [IU] via SUBCUTANEOUS
  Filled 2015-11-25 (×2): qty 0.1

## 2015-11-25 MED ORDER — INSULIN ASPART 100 UNIT/ML ~~LOC~~ SOLN: 0.0000 [IU] | Freq: Three times a day (TID) | SUBCUTANEOUS | Status: DC

## 2015-11-25 MED ORDER — SODIUM CHLORIDE 0.9 % IV BOLUS (SEPSIS)
1000.0000 mL | Freq: Once | INTRAVENOUS | Status: AC
  Administered 2015-11-25: 1000 mL via INTRAVENOUS

## 2015-11-25 MED ORDER — INSULIN GLARGINE 100 UNIT/ML ~~LOC~~ SOLN
5.0000 [IU] | Freq: Once | SUBCUTANEOUS | Status: DC
  Filled 2015-11-25: qty 0.05

## 2015-11-25 NOTE — Progress Notes (Addendum)
PROGRESS NOTE    Ashley Freeman  ZOX:[id number] DOB: May 31, 1990 DOA: 11/24/2015 PCP: No primary care provider on file.   Brief Narrative:  Ashley Freeman is a pleasant 25 y.o. BF PMHX Type 1 DM uncontrolled with complications, Noncompliance with medication presents to the emergency Department chief complaint of hyperglycemia. Initial evaluation reveals DKA  Information is obtained from the patient and chart. [facility] was sent to the ED due to hyperglycemia as well as ketones in her urine and hyponatremia. She was in the emergency department 4 days ago for a fall and diagnosed with BV at that time. She reports not having the money to fill metronidazole. She reports feeling very thirsty and frequent urination over the last several days. She also reports a continued itch and vaginal discharge. She denies dysuria hematuria. She denies any abdominal pain nausea vomiting. She denies headache visual disturbances syncope or near-syncope. She reports she has tried increased oral intake. She reports running out of her long acting insulin as she was trying to "make it last". He states she's been taken her short acting as prescribed. She denies chest pain palpitation shortness of breath.   Subjective: 7/28 A/O 4, CLUELESS to the severity of her disease process.     Assessment & Plan:   Principal Problem:   DKA, type 1 (HCC) Active Problems:   Diabetes mellitus type 1, uncontrolled, with complications (HCC)   Sinus tachycardia (HCC)   Hyperglycemia   Hyponatremia   Diabetic ketoacidosis without coma associated with type 1 diabetes mellitus (HCC)   AKI (acute kidney injury) (HCC)   BV (bacterial vaginosis)   Chest pain   DKA/diabetes type 1 uncontrolled with comfort complications -07/26/2014 Hemoglobin A1c= 16.2:   7/27 A1c pending -. Likely related to noncompliance secondary to financial constraints swell as recently  diagnosed BV. Serum glucose 1062 on admission. Gap 19. Sodium 119, chloride 88 CO2 12. She is provided with 2 L normal saline.  -Home regimen includes Lantus 20 units BID, but per patient ran out -Lantus 10 units -Resistant SSI --Diabetes coordinator: Intimately familiar with patient and her noncompliance. Spoke at length and agreed upon plan of care. Patient be seen at [facility]. NCM Deborah obtain Lantus and NovoLog in anticipation that patient will be discharged in next 48 hours. -Diabetic Nutrition consult pending  Acute kidney injury. - Creatinine 1.14 on admission Lab Results  Component Value Date   CREATININE 0.68 11/25/2015   CREATININE 0.54 11/25/2015   CREATININE 0.61 11/25/2015  -Resolved with resolution of DKA   Hyponatremia.  -Improved  BV.  -Diagnosed in the emergency department 4 days ago. Unable to fill prescription.  -flagyl IV for now given nothing by mouth status secondary to #1  Sinus tachycardia. -Resolved  Chest pain. -Developed  late in her stay in the emergency department. Reproducible. Likely musculoskeletal -Obtain an EKG -Troponin 2 negative    DVT prophylaxis: SCD Code Status: Full Family Communication: None Disposition Plan: Resolution DKA   Consultants:    Procedures/Significant Events:    Cultures   Antimicrobials: Metronidazole 7/27>>   Devices    LINES / TUBES:      Continuous Infusions: . sodium chloride    . sodium chloride 125 mL/hr at 11/24/15 1430  . dextrose 5 % and 0.45% NaCl 150 mL (11/25/15 0637)  . insulin (NOVOLIN-R) infusion 0.2 Units/hr (11/25/15 0552)     Objective: Vitals:   11/25/15 2007 11/25/15 2328  11/26/15 0330 11/26/15 0807  BP: 120/84 116/83 111/79 109/81  Pulse:  95 (!) 102 100  Resp: 18 19 20 20   Temp: 98.3 F (36.8 C) 98.1 F (36.7 C) 98 F (36.7 C) 98.2 F (36.8 C)  TempSrc: Oral Oral Oral Oral  SpO2: 100% 100% 100% 98%  Weight:      Height:         Intake/Output Summary (Last 24 hours) at 11/26/15 0944 Last data filed at 11/26/15 0500  Gross per 24 hour  Intake          2917.92 ml  Output                0 ml  Net          2917.92 ml   Filed Weights   11/24/15 1545  Weight: 53.8 kg (118 lb 9.6 oz)    Examination:  General: A/O 4, NAD, No acute respiratory distress Eyes: negative scleral hemorrhage, negative anisocoria, negative icterus ENT: Negative Runny nose, negative gingival bleeding, Neck:  Negative scars, masses, torticollis, lymphadenopathy, JVD Lungs: Clear to auscultation bilaterally without wheezes or crackles Cardiovascular: Regular rate and rhythm without murmur gallop or rub normal S1 and S2 Abdomen: negative abdominal pain, nondistended, positive soft, bowel sounds, no rebound, no ascites, no appreciable mass Extremities: No significant cyanosis, clubbing, or edema bilateral lower extremities Skin: Negative rashes, lesions, ulcers Psychiatric:  Negative depression, negative anxiety, negative fatigue, negative mania  Central nervous system:  Cranial nerves II through XII intact, tongue/uvula midline, all extremities muscle strength 5/5,  negative dysarthria, negative expressive aphasia, negative receptive aphasia.  .     Data Reviewed: Care during the described time interval was provided by me .  I have reviewed this patient's available data, including medical history, events of note, physical examination, and all test results as part of my evaluation. I have personally reviewed and interpreted all radiology studies.  CBC:  Recent Labs Lab 11/24/15 1250 11/25/15 0213  WBC 6.0 8.3  HGB 12.2 11.0*  HCT 39.2 33.6*  MCV 90.1 82.0  PLT 281 278   Basic Metabolic Panel:  Recent Labs Lab 11/25/15 0539 11/25/15 1137 11/25/15 1538 11/25/15 1917 11/25/15 2309 11/26/15 0425  NA 135 135 133* 133* 131*  --   K 3.9 3.6 3.8 3.4* 3.6  --   CL 112* 112* 106 109 106  --   CO2 19* 19* 18* 19* 18*  --    GLUCOSE 77 110* 199* 192* 305*  --   BUN <5* <5* <5* <5* <5*  --   CREATININE 0.45 0.43* 0.61 0.54 0.68  --   CALCIUM 8.0* 8.2* 8.7* 8.3* 8.0*  --   MG  --   --   --   --   --  1.8   GFR: Estimated Creatinine Clearance: 90.5 mL/min (by C-G formula based on SCr of 0.8 mg/dL). Liver Function Tests: No results for input(s): AST, ALT, ALKPHOS, BILITOT, PROT, ALBUMIN in the last 168 hours. No results for input(s): LIPASE, AMYLASE in the last 168 hours. No results for input(s): AMMONIA in the last 168 hours. Coagulation Profile: No results for input(s): INR, PROTIME in the last 168 hours. Cardiac Enzymes:  Recent Labs Lab 11/24/15 2045 11/25/15 0213  TROPONINI <0.03 <0.03   BNP (last 3 results) No results for input(s): PROBNP in the last 8760 hours. HbA1C: No results for input(s): HGBA1C in the last 72 hours. CBG:  Recent Labs Lab 11/25/15 2035 11/25/15 2326 11/26/15 0049  11/26/15 0328 11/26/15 0758  GLUCAP 264* 304* 233* 109* 280*   Lipid Profile: No results for input(s): CHOL, HDL, LDLCALC, TRIG, CHOLHDL, LDLDIRECT in the last 72 hours. Thyroid Function Tests: No results for input(s): TSH, T4TOTAL, FREET4, T3FREE, THYROIDAB in the last 72 hours. Anemia Panel: No results for input(s): VITAMINB12, FOLATE, FERRITIN, TIBC, IRON, RETICCTPCT in the last 72 hours. Urine analysis:    Component Value Date/Time   COLORURINE YELLOW 11/24/2015 1415   APPEARANCEUR CLOUDY (A) 11/24/2015 1415   APPEARANCEUR Hazy 11/10/2013 2043   LABSPEC 1.033 (H) 11/24/2015 1415   LABSPEC 1.031 11/10/2013 2043   PHURINE 5.5 11/24/2015 1415   GLUCOSEU >1000 (A) 11/24/2015 1415   GLUCOSEU >=500 11/10/2013 2043   HGBUR NEGATIVE 11/24/2015 1415   BILIRUBINUR Negative 11/24/2015 1443   BILIRUBINUR Negative 11/10/2013 2043   KETONESUR >80 (A) 11/24/2015 1415   PROTEINUR Negative 11/24/2015 1443   PROTEINUR NEGATIVE 11/24/2015 1415   UROBILINOGEN 0.2 11/24/2015 1443   UROBILINOGEN 1.0 03/12/2015  2039   NITRITE Negative 11/24/2015 1443   NITRITE NEGATIVE 11/24/2015 1415   LEUKOCYTESUR Negative 11/24/2015 1443   LEUKOCYTESUR 1+ 11/10/2013 2043   Sepsis Labs: @LABRCNTIP (procalcitonin:4,lacticidven:4)  ) Recent Results (from the past 240 hour(s))  Urine culture     Status: Abnormal   Collection Time: 11/20/15  9:05 AM  Result Value Ref Range Status   Specimen Description URINE, RANDOM  Final   Special Requests NONE  Final   Culture (A)  Final    80,000 COLONIES/mL GROUP B STREP(S.AGALACTIAE)ISOLATED TESTING AGAINST S. AGALACTIAE NOT ROUTINELY PERFORMED DUE TO PREDICTABILITY OF AMP/PEN/VAN SUSCEPTIBILITY.    Report Status 11/21/2015 FINAL  Final  Wet prep, genital     Status: Abnormal   Collection Time: 11/20/15 10:20 AM  Result Value Ref Range Status   Yeast Wet Prep HPF POC NONE SEEN NONE SEEN Final   Trich, Wet Prep NONE SEEN NONE SEEN Final   Clue Cells Wet Prep HPF POC PRESENT (A) NONE SEEN Final   WBC, Wet Prep HPF POC FEW (A) NONE SEEN Final   Sperm NONE SEEN  Final  MRSA PCR Screening     Status: None   Collection Time: 11/24/15  4:50 PM  Result Value Ref Range Status   MRSA by PCR NEGATIVE NEGATIVE Final    Comment:        The GeneXpert MRSA Assay (FDA approved for NASAL specimens only), is one component of a comprehensive MRSA colonization surveillance program. It is not intended to diagnose MRSA infection nor to guide or monitor treatment for MRSA infections.          Radiology Studies: No results found.      Scheduled Meds: . enoxaparin (LOVENOX) injection  40 mg Subcutaneous Q24H  . insulin aspart  0-20 Units Subcutaneous Q4H  . insulin glargine  10 Units Subcutaneous Daily  . metronidazole  500 mg Intravenous Q8H   Continuous Infusions: . sodium chloride    . sodium chloride 125 mL/hr at 11/24/15 1430  . dextrose 5 % and 0.45% NaCl 150 mL (11/25/15 0637)  . insulin (NOVOLIN-R) infusion 0.2 Units/hr (11/25/15 0552)     LOS: 2 days     Time spent: 40 minutes    Ashley Freeman, Roselind Messier, MD [facility] Pager [phone]   If 7PM-7AM, please contact night-coverage [email or web address] Password [facility] 11/26/2015, 9:44 AM

## 2015-11-25 NOTE — Care Management Note (Signed)
Case Management Note  Patient Details  Name: Ashley Freeman MRN: [id number] Date of Birth: 05/01/1990  Subjective/Objective:   Patient  Lives with parents, she does not have insurance, or PCP, NCM scheduled apt with [facility] on Monday, NCM informed patient that it is very important for her to make those apts .  Patient states she does not have funds to get her meds for insulin, [facility] will fill for patient and put on her bill.  Nurse Tech will pick up meds from [facility] clinic for patient, so she will be able to dc over the weekend.                 Action/Plan:   Expected Discharge Date:  11/26/15               Expected Discharge Plan:  Home/Self Care  In-House Referral:     Discharge planning Services  CM Consult, Follow-up appt scheduled, [facility], Medication Assistance  Post Acute Care Choice:    Choice offered to:     DME Arranged:    DME Agency:     HH Arranged:    HH Agency:     Status of Service:  Completed, signed off  If discussed at [employer], dates discussed:    Additional Comments:  Zenon Mayo, RN 11/25/2015, 4:27 PM

## 2015-11-25 NOTE — Clinical Social Work Note (Signed)
CSW acknowledges consult for "Type 1 DM with no insurance unable to afford medication." RNCM aware.  CSW signing off. Consult again if any social work needs arise.  Dayton Scrape, [place]

## 2015-11-25 NOTE — Progress Notes (Signed)
Inpatient Diabetes Program Recommendations  AACE/ADA: New Consensus Statement on Inpatient Glycemic Control (2015)  Target Ranges:  Prepandial:   less than 140 mg/dL      Peak postprandial:   less than 180 mg/dL (1-2 hours)      Critically ill patients:  140 - 180 mg/dL   Lab Results  Component Value Date   GLUCAP 104 (H) 11/25/2015   HGBA1C  11/24/2015     Comment:     Pt sugar was to high to read     Review of Glycemic Control  Diabetes history: DM 1 since age 25 years old Outpatient Diabetes medications: Lantus 20 units bid and 5-10 units bid Current orders for Inpatient glycemic control: IV insulin per glucostabilizer.  Transitioning to lantus 10 units and sensitive correction tidwc.  Inpatient Diabetes Program Recommendations:    Consult received requesting evaluating management of diabetes per patient.  Spoke with Dr. Sherral Hammers after talking with patient. Patient states that she has been unable to get her insulin prescriptions due to not having a PCP. Patient however did go to her appointment  At [facility] yesterday and was sent to the ED at [facility].  Patient has had multiple ED visits with hyperglycemia and DKA status. Last HgbA1C was in March of 2016 with value of 16.2%. Patient has had another drawn but is pending. May not have results before Monday as they are not tested over the weekend.   Present orders are for lantus 10 units and sensitive correction tidwc.  For discharge orders, according to patient's weight of approximately 119 lbs, (54 kg), her basal needs (lantus) are approximately at 27 units and her bolus needs (novolog) would average at least 3 units tidwc meal coverage in addition to sensitive correction tidwc.(Pt states she has been taking lantus 20 units bid but appears that she may be trying to cover more than her basal insulin needs)  Although patient will again follow up with [facility] per appoiontment made by care manager, patient will need her insulins at discharge over the  weekend before her next appt at [facility]. Care manager Mathis Bud has asked MD to print or fax the prescriptions for both novolog and lantus to the [facility] pharmacy. She states she will go to the OP pharmacy and pick them up herself. Patient is aware that she cannot live without taking insulin.  Thank you Rosita Kea, RN, MSN, CDE  Diabetes Inpatient Program Office: [phone] Pager: [phone] 8:00 am to 5:00 pm

## 2015-11-26 LAB — GLUCOSE, CAPILLARY
GLUCOSE-CAPILLARY: 130 mg/dL — AB (ref 65–99)
GLUCOSE-CAPILLARY: 233 mg/dL — AB (ref 65–99)
GLUCOSE-CAPILLARY: 280 mg/dL — AB (ref 65–99)
Glucose-Capillary: 304 mg/dL — ABNORMAL HIGH (ref 65–99)
Glucose-Capillary: 109 mg/dL — ABNORMAL HIGH (ref 65–99)
Glucose-Capillary: 205 mg/dL — ABNORMAL HIGH (ref 65–99)
Glucose-Capillary: 125 mg/dL — ABNORMAL HIGH (ref 65–99)

## 2015-11-26 LAB — MAGNESIUM: Magnesium: 1.8 mg/dL (ref 1.7–2.4)

## 2015-11-26 MED ORDER — SODIUM CHLORIDE 0.9 % IV SOLN
INTRAVENOUS | Status: DC
  Administered 2015-11-26 (×2): via INTRAVENOUS
  Filled 2015-11-26 (×5): qty 150

## 2015-11-26 MED ORDER — INSULIN GLARGINE 100 UNIT/ML ~~LOC~~ SOLN
20.0000 [IU] | Freq: Every day | SUBCUTANEOUS | Status: DC
Start: 2015-11-27 — End: 2015-11-27
  Administered 2015-11-27: 20 [IU] via SUBCUTANEOUS
  Filled 2015-11-26: qty 0.2

## 2015-11-26 MED ORDER — METRONIDAZOLE 500 MG PO TABS
500.0000 mg | ORAL_TABLET | Freq: Three times a day (TID) | ORAL | Status: DC
  Administered 2015-11-26 – 2015-11-27 (×4): 500 mg via ORAL
  Filled 2015-11-26 (×4): qty 1

## 2015-11-26 MED ORDER — INSULIN GLARGINE 100 UNIT/ML ~~LOC~~ SOLN
10.0000 [IU] | Freq: Once | SUBCUTANEOUS | Status: AC
Start: 2015-11-26 — End: 2015-11-26
  Administered 2015-11-26: 10 [IU] via SUBCUTANEOUS
  Filled 2015-11-26: qty 0.1

## 2015-11-26 NOTE — Progress Notes (Signed)
PROGRESS NOTE    Ashley Freeman  UUV:[id number] DOB: 25-Jan-1991 DOA: 11/24/2015 PCP: No primary care provider on file.   Brief Narrative:  Ashley Freeman is a pleasant 25 y.o. BF PMHX Type 1 DM uncontrolled with complications, Noncompliance with medication presents to the emergency Department chief complaint of hyperglycemia. Initial evaluation reveals DKA  Information is obtained from the patient and chart. She went to [facility] for her scheduled appointment was sent to the ED due to hyperglycemia as well as ketones in her urine and hyponatremia. She was in the emergency department 4 days ago for a fall and diagnosed with BV at that time. She reports not having the money to fill metronidazole. She reports feeling very thirsty and frequent urination over the last several days. She also reports a continued itch and vaginal discharge. She denies dysuria hematuria. She denies any abdominal pain nausea vomiting. She denies headache visual disturbances syncope or near-syncope. She reports she has tried increased oral intake. She reports running out of her long acting insulin as she was trying to "make it last". He states she's been taken her short acting as prescribed. She denies chest pain palpitation shortness of breath.   Subjective: 7/29 A/O 4, CLUELESS to the severity of her disease process. States mother has taken the insulin home provided by the [facility] wellness clinic yesterday. Would like her mother as her HCPOA    Assessment & Plan:   Principal Problem:   DKA, type 1 (HCC) Active Problems:   Diabetes mellitus type 1, uncontrolled, with complications (HCC)   Sinus tachycardia (HCC)   Hyperglycemia   Hyponatremia   Diabetic ketoacidosis without coma associated with type 1 diabetes mellitus (HCC)   AKI (acute kidney injury) (HCC)   BV (bacterial vaginosis)   Chest pain   DKA/diabetes type 1 uncontrolled with comfort  complications/Hyperglycemia@@@ -07/26/2014 Hemoglobin A1c= 16.2:   7/27 A1c pending -. Likely related to noncompliance secondary to financial constraints swell as recently diagnosed BV. Serum glucose 1062 on admission. Gap 19. Sodium 119, chloride 88 CO2 12. She is provided with 2 L normal saline.  -Home regimen includes Lantus 20 units BID, but per patient ran out -Increase Lantus 20 units daily -Resistant SSI --Diabetes coordinator: Intimately familiar with patient and her noncompliance. Spoke at length and agreed upon plan of care. Patient be seen at [facility]. NCM Deborah obtain Lantus and NovoLog in anticipation that patient will be discharged in next 48 hours. -Diabetic Nutrition consult pending  Acute kidney injury. - Creatinine 1.14 on admission Lab Results  Component Value Date   CREATININE 0.68 11/25/2015   CREATININE 0.54 11/25/2015   CREATININE 0.61 11/25/2015  -Resolved with resolution of DKA  Hyponatremia. -Sodium bicarbonate 106ml/hr  BV.  -Diagnosed in the emergency department 4 days ago. Unable to fill prescription.  -Continue Metronidazole  Sinus tachycardia. -Resolved  Chest pain. -Developed  late in her stay in the emergency department. Reproducible. Likely musculoskeletal -Troponin 2 negative    DVT prophylaxis: SCD Code Status: Full Family Communication: None Disposition Plan: Resolution DKA   Consultants:    Procedures/Significant Events:    Cultures   Antimicrobials: Metronidazole 7/27>>   Devices    LINES / TUBES:      Continuous Infusions: . sodium chloride    . sodium chloride 125 mL/hr at 11/24/15 1430  . dextrose 5 % and 0.45% NaCl 150 mL (11/25/15 0637)  . insulin (NOVOLIN-R) infusion 0.2 Units/hr (11/25/15 0552)  Objective: Vitals:   11/25/15 2007 11/25/15 2328 11/26/15 0330 11/26/15 0807  BP: 120/84 116/83 111/79 109/81  Pulse:  95 (!) 102 100  Resp: 18 19 20 20   Temp: 98.3 F (36.8 C) 98.1 F  (36.7 C) 98 F (36.7 C) 98.2 F (36.8 C)  TempSrc: Oral Oral Oral Oral  SpO2: 100% 100% 100% 98%  Weight:      Height:        Intake/Output Summary (Last 24 hours) at 11/26/15 0945 Last data filed at 11/26/15 0500  Gross per 24 hour  Intake          2917.92 ml  Output                0 ml  Net          2917.92 ml   Filed Weights   11/24/15 1545  Weight: 53.8 kg (118 lb 9.6 oz)    Examination:  General: A/O 4, NAD, No acute respiratory distress Eyes: negative scleral hemorrhage, negative anisocoria, negative icterus ENT: Negative Runny nose, negative gingival bleeding, Neck:  Negative scars, masses, torticollis, lymphadenopathy, JVD Lungs: Clear to auscultation bilaterally without wheezes or crackles Cardiovascular: Regular rate and rhythm without murmur gallop or rub normal S1 and S2 Abdomen: negative abdominal pain, nondistended, positive soft, bowel sounds, no rebound, no ascites, no appreciable mass Extremities: No significant cyanosis, clubbing, or edema bilateral lower extremities Skin: Negative rashes, lesions, ulcers Psychiatric:  Negative depression, negative anxiety, negative fatigue, negative mania  Central nervous system:  Cranial nerves II through XII intact, tongue/uvula midline, all extremities muscle strength 5/5,  negative dysarthria, negative expressive aphasia, negative receptive aphasia.  .     Data Reviewed: Care during the described time interval was provided by me .  I have reviewed this patient's available data, including medical history, events of note, physical examination, and all test results as part of my evaluation. I have personally reviewed and interpreted all radiology studies.  CBC:  Recent Labs Lab 11/24/15 1250 11/25/15 0213  WBC 6.0 8.3  HGB 12.2 11.0*  HCT 39.2 33.6*  MCV 90.1 82.0  PLT 281 278   Basic Metabolic Panel:  Recent Labs Lab 11/25/15 0539 11/25/15 1137 11/25/15 1538 11/25/15 1917 11/25/15 2309 11/26/15 0425   NA 135 135 133* 133* 131*  --   K 3.9 3.6 3.8 3.4* 3.6  --   CL 112* 112* 106 109 106  --   CO2 19* 19* 18* 19* 18*  --   GLUCOSE 77 110* 199* 192* 305*  --   BUN <5* <5* <5* <5* <5*  --   CREATININE 0.45 0.43* 0.61 0.54 0.68  --   CALCIUM 8.0* 8.2* 8.7* 8.3* 8.0*  --   MG  --   --   --   --   --  1.8   GFR: Estimated Creatinine Clearance: 90.5 mL/min (by C-G formula based on SCr of 0.8 mg/dL). Liver Function Tests: No results for input(s): AST, ALT, ALKPHOS, BILITOT, PROT, ALBUMIN in the last 168 hours. No results for input(s): LIPASE, AMYLASE in the last 168 hours. No results for input(s): AMMONIA in the last 168 hours. Coagulation Profile: No results for input(s): INR, PROTIME in the last 168 hours. Cardiac Enzymes:  Recent Labs Lab 11/24/15 2045 11/25/15 0213  TROPONINI <0.03 <0.03   BNP (last 3 results) No results for input(s): PROBNP in the last 8760 hours. HbA1C: No results for input(s): HGBA1C in the last 72 hours. CBG:  Recent  Labs Lab 11/25/15 2035 11/25/15 2326 11/26/15 0049 11/26/15 0328 11/26/15 0758  GLUCAP 264* 304* 233* 109* 280*   Lipid Profile: No results for input(s): CHOL, HDL, LDLCALC, TRIG, CHOLHDL, LDLDIRECT in the last 72 hours. Thyroid Function Tests: No results for input(s): TSH, T4TOTAL, FREET4, T3FREE, THYROIDAB in the last 72 hours. Anemia Panel: No results for input(s): VITAMINB12, FOLATE, FERRITIN, TIBC, IRON, RETICCTPCT in the last 72 hours. Urine analysis:    Component Value Date/Time   COLORURINE YELLOW 11/24/2015 1415   APPEARANCEUR CLOUDY (A) 11/24/2015 1415   APPEARANCEUR Hazy 11/10/2013 2043   LABSPEC 1.033 (H) 11/24/2015 1415   LABSPEC 1.031 11/10/2013 2043   PHURINE 5.5 11/24/2015 1415   GLUCOSEU >1000 (A) 11/24/2015 1415   GLUCOSEU >=500 11/10/2013 2043   HGBUR NEGATIVE 11/24/2015 1415   BILIRUBINUR Negative 11/24/2015 1443   BILIRUBINUR Negative 11/10/2013 2043   KETONESUR >80 (A) 11/24/2015 1415   PROTEINUR  Negative 11/24/2015 1443   PROTEINUR NEGATIVE 11/24/2015 1415   UROBILINOGEN 0.2 11/24/2015 1443   UROBILINOGEN 1.0 03/12/2015 2039   NITRITE Negative 11/24/2015 1443   NITRITE NEGATIVE 11/24/2015 1415   LEUKOCYTESUR Negative 11/24/2015 1443   LEUKOCYTESUR 1+ 11/10/2013 2043   Sepsis Labs: @LABRCNTIP (procalcitonin:4,lacticidven:4)  ) Recent Results (from the past 240 hour(s))  Urine culture     Status: Abnormal   Collection Time: 11/20/15  9:05 AM  Result Value Ref Range Status   Specimen Description URINE, RANDOM  Final   Special Requests NONE  Final   Culture (A)  Final    80,000 COLONIES/mL GROUP B STREP(S.AGALACTIAE)ISOLATED TESTING AGAINST S. AGALACTIAE NOT ROUTINELY PERFORMED DUE TO PREDICTABILITY OF AMP/PEN/VAN SUSCEPTIBILITY.    Report Status 11/21/2015 FINAL  Final  Wet prep, genital     Status: Abnormal   Collection Time: 11/20/15 10:20 AM  Result Value Ref Range Status   Yeast Wet Prep HPF POC NONE SEEN NONE SEEN Final   Trich, Wet Prep NONE SEEN NONE SEEN Final   Clue Cells Wet Prep HPF POC PRESENT (A) NONE SEEN Final   WBC, Wet Prep HPF POC FEW (A) NONE SEEN Final   Sperm NONE SEEN  Final  MRSA PCR Screening     Status: None   Collection Time: 11/24/15  4:50 PM  Result Value Ref Range Status   MRSA by PCR NEGATIVE NEGATIVE Final    Comment:        The GeneXpert MRSA Assay (FDA approved for NASAL specimens only), is one component of a comprehensive MRSA colonization surveillance program. It is not intended to diagnose MRSA infection nor to guide or monitor treatment for MRSA infections.          Radiology Studies: No results found.      Scheduled Meds: . enoxaparin (LOVENOX) injection  40 mg Subcutaneous Q24H  . insulin aspart  0-20 Units Subcutaneous Q4H  . insulin glargine  10 Units Subcutaneous Daily  . metronidazole  500 mg Intravenous Q8H   Continuous Infusions: . sodium chloride    . sodium chloride 125 mL/hr at 11/24/15 1430  .  dextrose 5 % and 0.45% NaCl 150 mL (11/25/15 0637)  . insulin (NOVOLIN-R) infusion 0.2 Units/hr (11/25/15 0552)     LOS: 2 days    Time spent: 40 minutes    Najia Hurlbutt, Roselind Messier, MD [facility] Pager [phone]   If 7PM-7AM, please contact night-coverage [email or web address] Password [facility] 11/26/2015, 9:45 AM

## 2015-11-26 NOTE — Plan of Care (Signed)
Problem: Food- and Nutrition-Related Knowledge Deficit (NB-1.1) Goal: Nutrition education Formal process to instruct or train a patient/client in a skill or to impart knowledge to help patients/clients voluntarily manage or modify food choices and eating behavior to maintain or improve health. Outcome: Completed/Met Date Met: 11/26/15  RD consulted for nutrition education regarding diabetes.  Pt is a type 1 diabetic who has been noncompliant with her insulin due to financial issues.  Reviewed the DM disease process and how high blood sugars affect the body/organs.  Discussed importance of taking insulin. Gave information and handouts on carb counting and electronic databases/mobile apps to help with carb counting. Pt receptive but with no questions.  Teach back method used.  Lab Results  Component Value Date   HGBA1C  11/24/2015     Comment:     Pt sugar was to high to read     Expect fair compliance.  Body mass index is 21.01 kg/m.   Labs and medications reviewed. No further nutrition interventions warranted at this time. RD contact information provided. If additional nutrition issues arise, please re-consult RD.  [place], [place], [place] Pager [phone] After Hours Pager

## 2015-11-27 LAB — GLUCOSE, CAPILLARY
GLUCOSE-CAPILLARY: 126 mg/dL — AB (ref 65–99)
GLUCOSE-CAPILLARY: 54 mg/dL — AB (ref 65–99)
GLUCOSE-CAPILLARY: 282 mg/dL — AB (ref 65–99)
GLUCOSE-CAPILLARY: 97 mg/dL (ref 65–99)
Glucose-Capillary: 101 mg/dL — ABNORMAL HIGH (ref 65–99)
Glucose-Capillary: 121 mg/dL — ABNORMAL HIGH (ref 65–99)

## 2015-11-27 LAB — BASIC METABOLIC PANEL
Anion gap: 7 (ref 5–15)
CALCIUM: 8.6 mg/dL — AB (ref 8.9–10.3)
CO2: 22 mmol/L (ref 22–32)
CREATININE: 0.49 mg/dL (ref 0.44–1.00)
Chloride: 107 mmol/L (ref 101–111)
GFR calc Af Amer: >60 mL/min (ref >60)
Glucose, Bld: 256 mg/dL — ABNORMAL HIGH (ref 65–99)
Potassium: 3.7 mmol/L (ref 3.5–5.1)
SODIUM: 136 mmol/L (ref 135–145)

## 2015-11-27 LAB — MAGNESIUM: Magnesium: 1.7 mg/dL (ref 1.7–2.4)

## 2015-11-27 MED ORDER — MAGNESIUM OXIDE 400 (241.3 MG) MG PO TABS
400.0000 mg | ORAL_TABLET | Freq: Once | ORAL | Status: AC
  Administered 2015-11-27: 400 mg via ORAL
  Filled 2015-11-27: qty 1

## 2015-11-27 MED ORDER — METRONIDAZOLE 500 MG PO TABS: 500.0000 mg | ORAL_TABLET | Freq: Two times a day (BID) | ORAL | 0 refills | Status: DC

## 2015-11-27 MED ORDER — INSULIN ASPART 100 UNIT/ML FLEXPEN: 6.0000 [IU] | PEN_INJECTOR | Freq: Three times a day (TID) | SUBCUTANEOUS | 11 refills | Status: DC

## 2015-11-27 MED ORDER — INSULIN ASPART 100 UNIT/ML ~~LOC~~ SOLN: 6.0000 [IU] | Freq: Three times a day (TID) | SUBCUTANEOUS | Status: DC

## 2015-11-27 MED ORDER — METRONIDAZOLE 500 MG PO TABS: 500.0000 mg | ORAL_TABLET | Freq: Two times a day (BID) | ORAL | Status: DC

## 2015-11-27 MED ORDER — INSULIN GLARGINE 100 UNIT/ML SOLOSTAR PEN: 20.0000 [IU] | PEN_INJECTOR | Freq: Every day | SUBCUTANEOUS | 11 refills | Status: DC

## 2015-11-27 NOTE — Progress Notes (Signed)
CBG 52 this am. Pt fully A&O. Sprite and graham crackers given.  0820- Re-check CBG 126. Pt resting comfortably in bed, and denies any complaints at this time. Will continue to monitor pt. Call bell/phone is within reach.

## 2015-11-27 NOTE — Discharge Summary (Signed)
Physician Discharge Summary  Ashley Freeman [id number] DOB: 03-03-1991 DOA: 11/24/2015  PCP: No primary care provider on file.  Admit date: 11/24/2015 Discharge date: 11/27/2015  Time spent:35 minutes  Recommendations for Outpatient Follow-up:  DKA/diabetes type 1 uncontrolled with complications/Hyperglycemia -07/26/2014 Hemoglobin A1c= 16.2:   7/28 Hemoglobin A1c= 11 -Noncompliance with medication  -Prior to admission Home regimen includes Lantus 20 units BID, but per patient ran out --Diabetes coordinator: Intimately familiar with patient and her noncompliance. Spoke at length and agreed upon plan of care. Patient be seen at [facility] on Monday 7/31@0  900.  -Diabetic Nutrition: Patient received education. -Discharge on Lantus 20 units daily -NovoLog 6 units QAC   Acute kidney injury. - Creatinine 1.14 on admission Recent Labs       Lab Results  Component Value Date   CREATININE 0.68 11/25/2015   CREATININE 0.54 11/25/2015   CREATININE 0.61 11/25/2015    -Resolved with resolution of DKA  Hyponatremia. -Resolved  BV.  -Diagnosed in the emergency department 4 days ago. Unable to fill prescription.  -Metronidazole 500 mg BID x 7 days   Sinus tachycardia. -Resolved  Chest pain. -Developed late in her stay in the emergency department. Reproducible. Likely musculoskeletal -Troponin 2 negative  -Resolved       Discharge Diagnoses:  Principal Problem:   DKA, type 1 ([place]) Active Problems:   Diabetes mellitus type 1, uncontrolled, with complications (HCC)   Sinus tachycardia (HCC)   Hyperglycemia   Hyponatremia   Diabetic ketoacidosis without coma associated with type 1 diabetes mellitus (HCC)   AKI (acute kidney injury) ([place])   BV (bacterial vaginosis)   Chest pain   Discharge Condition: Stable  Diet recommendation: American diabetic Association  Filed Weights   11/24/15 1545  Weight: 53.8 kg (118 lb 9.6 oz)    History of  present illness:  Ashley Freeman a pleasant 25 y.o.BF PMHX Type 1 DM uncontrolled with complications, Noncompliance with medication presents to the emergency Department chief complaint of hyperglycemia. Initial evaluation reveals DKA  Information is obtained from the patient and chart. She went to [place] for her scheduled appointment was sent to the ED due to hyperglycemia as well as ketones in her urine and hyponatremia. She was in the emergency department 4 days ago for a fall and diagnosed with BV at that time. She reports not having the money to fill metronidazole. She reports feeling very thirsty and frequent urination over the last several days. She also reports a continued itch and vaginal discharge. She denies dysuria hematuria. She denies any abdominal pain nausea vomiting. She denies headache visual disturbances syncope or near-syncope. She reports she has tried increased oral intake. She reports running out of her long acting insulin as she was trying to "make it last". She states she's been taken her short acting as prescribed. She denies chest pain palpitation shortness of breath. During his hospitalization patient was treated for DKA/DM type I uncontrolled with noncompliance with medication. Patient received counseling by diabetic coordinator and myself on the absolute necessity of obtaining control of her diabetes. Patient was provided with follow-up appointment at [facility], and medication to cover her until she is seen appointment. Patient's insulin medication was taken home by her mother on Friday 11/25/2015 and consisted of Lantus insulin pen and NovoLog insulin pen.   Antimicrobials: Metronidazole 7/27>>     Discharge Exam: Vitals:   11/26/15 1501 11/26/15 2214 11/27/15 0500 11/27/15 1404  BP: 122/82 115/79  115/77 120/80  Pulse: (!) 104 100 94 (!) 101  Resp:  16 16 16   Temp: 98.1 F (36.7 C) 98.3 F (36.8 C) 98.4 F (36.9 C) 98  F (36.7 C)  TempSrc: Oral Oral Oral Oral  SpO2: 100% 99% 99% 100%  Weight:      Height:        General: A/O 4, NAD, No acute respiratory distress Eyes: negative scleral hemorrhage, negative anisocoria, negative icterus ENT: Negative Runny nose, negative gingival bleeding, Neck:  Negative scars, masses, torticollis, lymphadenopathy, JVD Lungs: Clear to auscultation bilaterally without wheezes or crackles Cardiovascular: Regular rate and rhythm without murmur gallop or rub normal S1 and S2 Abdomen: negative abdominal pain, nondistended, positive soft, bowel sounds, no rebound, no ascites, no appreciable mass   Discharge Instructions     Medication List    STOP taking these medications   ibuprofen 600 MG tablet Commonly known as:  ADVIL,MOTRIN   insulin aspart 100 UNIT/ML injection Commonly known as:  NOVOLOG Replaced by:  insulin aspart 100 UNIT/ML FlexPen   insulin glargine 100 UNIT/ML injection Commonly known as:  LANTUS Replaced by:  Insulin Glargine 100 UNIT/ML Solostar Pen   Insulin Syringes (Disposable) U-100 0.3 ML Misc     TAKE these medications   insulin aspart 100 UNIT/ML FlexPen Commonly known as:  NOVOLOG FLEXPEN Inject 6 Units into the skin 3 (three) times daily with meals. Replaces:  insulin aspart 100 UNIT/ML injection   Insulin Glargine 100 UNIT/ML Solostar Pen Commonly known as:  LANTUS Inject 20 Units into the skin at bedtime. Replaces:  insulin glargine 100 UNIT/ML injection   metroNIDAZOLE 500 MG tablet Commonly known as:  FLAGYL Take 1 tablet (500 mg total) by mouth every 12 (twelve) hours. What changed:  when to take this      Allergies  Allergen Reactions  . Penicillins Hives    Childhood allergic reaction Has patient had a PCN reaction causing immediate rash, facial/tongue/throat swelling, SOB or lightheadedness with hypotension: Yes Has patient had a PCN reaction causing severe rash involving mucus membranes or skin necrosis:  No Has patient had a PCN reaction that required hospitalization: unknown Has patient had a PCN reaction occurring within the last 10 years: No If all of the above answers are "NO", then may proceed with Cephalosporin use.   Follow-up Information    Ashley. Go on 11/28/2015.   Why:  9 am for hospital follow up Contact information: [address] [place] [phone]           The results of significant diagnostics from this hospitalization (including imaging, microbiology, ancillary and laboratory) are listed below for reference.    Significant Diagnostic Studies: Ct Head Wo Contrast  Result Date: 11/20/2015 CLINICAL DATA:  Status post fall.  Headache since fall.  Nausea. EXAM: CT HEAD WITHOUT CONTRAST TECHNIQUE: Contiguous axial images were obtained from the base of the skull through the vertex without intravenous contrast. COMPARISON:  March 30, 2010 FINDINGS: Brain: No evidence of acute infarction, hemorrhage, extra-axial collection, ventriculomegaly, or mass effect. Vascular: No hyperdense vessel or unexpected calcification. Skull: Negative for fracture or focal lesion. Sinuses/Orbits: No acute findings. Other: None. IMPRESSION: Normal Electronically Signed   By: Dorise Bullion III M.D   On: 11/20/2015 09:56   Microbiology: Recent Results (from the past 240 hour(s))  Urine culture     Status: Abnormal   Collection Time: 11/20/15  9:05 AM  Result Value Ref Range  Status   Specimen Description URINE, RANDOM  Final   Special Requests NONE  Final   Culture (A)  Final    80,000 COLONIES/mL GROUP B STREP(S.AGALACTIAE)ISOLATED TESTING AGAINST S. AGALACTIAE NOT ROUTINELY PERFORMED DUE TO PREDICTABILITY OF AMP/PEN/VAN SUSCEPTIBILITY.    Report Status 11/21/2015 FINAL  Final  Wet prep, genital     Status: Abnormal   Collection Time: 11/20/15 10:20 AM  Result Value Ref Range Status   Yeast Wet Prep HPF POC NONE  SEEN NONE SEEN Final   Trich, Wet Prep NONE SEEN NONE SEEN Final   Clue Cells Wet Prep HPF POC PRESENT (A) NONE SEEN Final   WBC, Wet Prep HPF POC FEW (A) NONE SEEN Final   Sperm NONE SEEN  Final  MRSA PCR Screening     Status: None   Collection Time: 11/24/15  4:50 PM  Result Value Ref Range Status   MRSA by PCR NEGATIVE NEGATIVE Final    Comment:        The GeneXpert MRSA Assay (FDA approved for NASAL specimens only), is one component of a comprehensive MRSA colonization surveillance program. It is not intended to diagnose MRSA infection nor to guide or monitor treatment for MRSA infections.      Labs: Basic Metabolic Panel:  Recent Labs Lab 11/25/15 1137 11/25/15 1538 11/25/15 1917 11/25/15 2309 11/26/15 0425 11/27/15 0549 11/27/15 1327  NA 135 133* 133* 131*  --   --  136  K 3.6 3.8 3.4* 3.6  --   --  3.7  CL 112* 106 109 106  --   --  107  CO2 19* 18* 19* 18*  --   --  22  GLUCOSE 110* 199* 192* 305*  --   --  256*  BUN <5* <5* <5* <5*  --   --  <5*  CREATININE 0.43* 0.61 0.54 0.68  --   --  0.49  CALCIUM 8.2* 8.7* 8.3* 8.0*  --   --  8.6*  MG  --   --   --   --  1.8 1.7  --    Liver Function Tests: No results for input(s): AST, ALT, ALKPHOS, BILITOT, PROT, ALBUMIN in the last 168 hours. No results for input(s): LIPASE, AMYLASE in the last 168 hours. No results for input(s): AMMONIA in the last 168 hours. CBC:  Recent Labs Lab 11/24/15 1250 11/25/15 0213  WBC 6.0 8.3  HGB 12.2 11.0*  HCT 39.2 33.6*  MCV 90.1 82.0  PLT 281 278   Cardiac Enzymes:  Recent Labs Lab 11/24/15 2045 11/25/15 0213  TROPONINI <0.03 <0.03   BNP: BNP (last 3 results) No results for input(s): BNP in the last 8760 hours.  ProBNP (last 3 results) No results for input(s): PROBNP in the last 8760 hours.  CBG:  Recent Labs Lab 11/27/15 0010 11/27/15 0457 11/27/15 0758 11/27/15 0829 11/27/15 1212  GLUCAP 101* 121* 54* 126* 282*       Signed:  Dia Crawford,  MD [facility] [phone] pager

## 2015-11-28 ENCOUNTER — Ambulatory Visit: Payer: Self-pay | Attending: Internal Medicine | Admitting: Physician Assistant

## 2015-11-28 VITALS — BP 128/90 | HR 114 | Temp 98.3°F | Resp 16 | Ht 63.0 in | Wt 127.4 lb

## 2015-11-28 DIAGNOSIS — E108 Type 1 diabetes mellitus with unspecified complications: Secondary | ICD-10-CM

## 2015-11-28 DIAGNOSIS — IMO0002 Reserved for concepts with insufficient information to code with codable children: Secondary | ICD-10-CM

## 2015-11-28 DIAGNOSIS — E1065 Type 1 diabetes mellitus with hyperglycemia: Secondary | ICD-10-CM

## 2015-11-28 LAB — GLUCOSE, POCT (MANUAL RESULT ENTRY): POC GLUCOSE: 318 mg/dL — AB (ref 70–99)

## 2015-11-28 NOTE — Patient Instructions (Signed)
Please keep a log of your blood sugars 3 times per day and bring to next appointment Drink plenty of water

## 2015-11-28 NOTE — Progress Notes (Signed)
Pt here for hospital F/U for diabetes. Pt denies pain today. Pt has not taken medications today. Pt has eaten today. CBG is 318.

## 2015-11-28 NOTE — Addendum Note (Signed)
Addended by: Trecia Rogers on: 11/28/2015 10:27 AM   Modules accepted: Orders

## 2015-11-28 NOTE — Progress Notes (Addendum)
[place]  [id number]  [id number]  DOB - 28-Apr-1991  Chief Complaint  Patient presents with  . Hospitalization Follow-up       Subjective:   Ashley Freeman is a 25 y.o. female here today for hospitalization followup. She was hospitalized [facility] 11/27/2015 after we sent her there when noting hyperglycemia with ketones in her urine. Her hemoglobin A1c was 11%. She states she had been out of medications for quite sometimes. Her hospital course was complicated by acute kidney injury and tachycardia. Both of these issues resolved with hydration. She also was diagnosed with bacterial vaginosis and treated with metronidazole.She was sent home with Lantus 20 units daily. She also received NovoLog 6 units before meals per a sliding scale. She states that she did get the medications filled. However, this morning she did not take her NovoLog prior to coming to this appointment. She has had breakfast. Her blood glucose is 318 presently. Sometimes she has a little blurry vision. Sometimes her heart races a little. No chest pain. No shortness of breath. No dizziness. No polydipsia. No polyuria. She states that overall she feels better.   ROS: GEN: denies fever or chills, denies change in weight Skin: denies lesions or rashes HEENT: denies headache, earache, epistaxis, sore throat, or neck pain +blurred vision LUNGS: denies SHOB, dyspnea, PND, orthopnea CV: denies CP or palpitations ABD: denies abd pain, N or V EXT: denies muscle spasms or swelling; no pain in lower ext, no weakness NEURO: denies numbness or tingling, denies sz, stroke or TIA  ALLERGIES: Allergies  Allergen Reactions  . Penicillins Hives    Childhood allergic reaction Has patient had a PCN reaction causing immediate rash, facial/tongue/throat swelling, SOB or lightheadedness with hypotension: Yes Has patient had a PCN reaction causing severe rash involving mucus membranes or skin necrosis: No Has patient had  a PCN reaction that required hospitalization: unknown Has patient had a PCN reaction occurring within the last 10 years: No If all of the above answers are "NO", then may proceed with Cephalosporin use.    PAST MEDICAL HISTORY: Past Medical History:  Diagnosis Date  . Diabetes mellitus 2001   Diagnosed at age 54 ; Type I  . DKA (diabetic ketoacidoses) ([place]) 08/19/2013  . Gonorrhea 08/2011   Treated in 09/2011    PAST SURGICAL HISTORY: Past Surgical History:  Procedure Laterality Date  . INCISION AND DRAINAGE PERIRECTAL ABSCESS Right 08/18/2013   Procedure: IRRIGATION AND DEBRIDEMENT GLUTEAL ABSCESS;  Surgeon: Ralene Ok, MD;  Location: [facility];  Service: General;  Laterality: Right;  . INCISION AND DRAINAGE PERIRECTAL ABSCESS Right 09/19/2013   Procedure: IRRIGATION AND DEBRIDEMENT RIGHT GLUTEAL AND LABIAL ABSCESSES;  Surgeon: Ralene Ok, MD;  Location: [place] [facility];  Service: General;  Laterality: Right;  . INCISION AND DRAINAGE PERIRECTAL ABSCESS Right 09/24/2013   Procedure: IRRIGATION AND DEBRIDEMENT PERIRECTAL ABSCESS;  Surgeon: Gwenyth Ober, MD;  Location: [facility];  Service: General;  Laterality: Right;  . NO PAST SURGERIES      MEDICATIONS AT HOME: Prior to Admission medications   Medication Sig Start Date End Date Taking? Authorizing Provider  insulin aspart (NOVOLOG FLEXPEN) 100 UNIT/ML FlexPen Inject 6 Units into the skin 3 (three) times daily with meals. 11/27/15  Yes Allie Bossier, MD  Insulin Glargine (LANTUS) 100 UNIT/ML Solostar Pen Inject 20 Units into the skin at bedtime. 11/27/15  Yes Allie Bossier, MD  metroNIDAZOLE (FLAGYL) 500 MG tablet Take 1 tablet (500 mg total) by mouth every 12 (  twelve) hours. 11/27/15  Yes Allie Bossier, MD     Objective:   Vitals:   11/28/15 0914  BP: 128/90  Pulse: (!) 114  Resp: 16  Temp: 98.3 F (36.8 C)  TempSrc: Oral  SpO2: 98%  Weight: 127 lb 6.4 oz (57.8 kg)  Height: 5\' 3"  (1.6 m)    Exam General appearance : Awake,  alert, not in any distress. Speech Clear. Not toxic looking HEENT: Atraumatic and Normocephalic, pupils equally reactive to light and accomodation Neck: supple, no JVD. No cervical lymphadenopathy.  Chest:Good air entry bilaterally, no added sounds  CVS: S1 S2 regular, no murmurs.  Abdomen: Bowel sounds present, Non tender and not distended with no gaurding, rigidity or rebound. Extremities: B/L Lower Ext shows no edema, both legs are warm to touch Neurology: Awake alert, and oriented X 3, CN II-XII intact, Non focal Skin:No Rash Wounds:N/A  Data Review Lab Results  Component Value Date   HGBA1C  11/24/2015     Comment:     Pt sugar was to high to read    HGBA1C 16.2 (H) 07/26/2014   HGBA1C 16.4 (H) 07/26/2014    Assessment & Plan  1. Type 1 DM with hyperglycemia  -log and bring to next appt  -encouraged better compliance  -appt with Stacy for diabetes education  -I did not change her regimen, she was just d/c'd  Yesterday  ophthalmology referral 2. Tachycardia  -adequate hydration   -better BS control 3. BV  -continue Flagyl until complete    Return in about 1 week (around 12/05/2015).  The patient was given clear instructions to go to ER or return to medical center if symptoms don't improve, worsen or new problems develop. The patient verbalized understanding. The patient was told to call to get lab results if they haven't heard anything in the next week.   This note has been created with Surveyor, quantity. Any transcriptional errors are unintentional.    Zettie Pho, PA-C [facility] [place], [place]   11/28/2015, 9:38 AM

## 2015-11-30 ENCOUNTER — Ambulatory Visit: Payer: Self-pay | Admitting: Pharmacist

## 2015-12-12 ENCOUNTER — Ambulatory Visit: Payer: Self-pay | Admitting: Internal Medicine

## 2015-12-17 ENCOUNTER — Encounter (HOSPITAL_COMMUNITY): Payer: Self-pay

## 2015-12-17 ENCOUNTER — Emergency Department (HOSPITAL_COMMUNITY)
Admission: EM | Admit: 2015-12-17 | Discharge: 2015-12-17 | Disposition: A | Discharge status: Home / Self Care | Payer: Medicaid Other | Attending: Emergency Medicine | Admitting: Emergency Medicine

## 2015-12-17 ENCOUNTER — Emergency Department (HOSPITAL_COMMUNITY): Payer: Medicaid Other

## 2015-12-17 DIAGNOSIS — Z794 Long term (current) use of insulin: Secondary | ICD-10-CM | POA: Insufficient documentation

## 2015-12-17 DIAGNOSIS — E109 Type 1 diabetes mellitus without complications: Secondary | ICD-10-CM | POA: Insufficient documentation

## 2015-12-17 DIAGNOSIS — J069 Acute upper respiratory infection, unspecified: Secondary | ICD-10-CM | POA: Insufficient documentation

## 2015-12-17 DIAGNOSIS — B349 Viral infection, unspecified: Secondary | ICD-10-CM | POA: Insufficient documentation

## 2015-12-17 LAB — URINALYSIS, ROUTINE W REFLEX MICROSCOPIC
BILIRUBIN URINE: NEGATIVE
Glucose, UA: >1000 mg/dL — AB
Hgb urine dipstick: NEGATIVE
Ketones, ur: NEGATIVE mg/dL
NITRITE: NEGATIVE
PH: 6 (ref 5.0–8.0)
Protein, ur: NEGATIVE mg/dL
SPECIFIC GRAVITY, URINE: 1.028 (ref 1.005–1.030)

## 2015-12-17 LAB — URINE MICROSCOPIC-ADD ON: RBC / HPF: NONE SEEN RBC/hpf (ref 0–5)

## 2015-12-17 LAB — RAPID STREP SCREEN (MED CTR MEBANE ONLY): Streptococcus, Group A Screen (Direct): NEGATIVE

## 2015-12-17 LAB — CBG MONITORING, ED: Glucose-Capillary: 213 mg/dL — ABNORMAL HIGH (ref 65–99)

## 2015-12-17 NOTE — ED Provider Notes (Signed)
PROGRESS NOTE                                                                                                                 This is a sign-out from [place] at shift change: Ashley Freeman is a 25 y.o. female presenting with vague URI-like symptoms, patient is type I diabetic with poor compliance, chest x-ray negative, patient is afebrile overall well-appearing, blood sugar is 200. Patient with no urinary symptoms however will check a urinalysis. lease refer to previous note for full HPI, ROS, PMH and PE.   Urinalysis without signs of infection. Patient given work note and advised to rest and push fluids.     Monico Blitz, PA-C 12/17/15 [place], DO 12/18/15 0001

## 2015-12-17 NOTE — ED Triage Notes (Signed)
Onset today body aches and sweating.  Yesterday had sore throat, none today.

## 2015-12-17 NOTE — Discharge Instructions (Signed)
Return to the emergency room for any worsening or concerning symptoms including fast breathing, heart racing, confusion, vomiting.  Rest, cover your mouth when you cough and wash your hands frequently.   Push fluids: Drink water or Gatorade, do not drink any soda, juice or caffeinated beverages.  For fever and pain control you can take Motrin (ibuprofen) as follows: 400 mg (this is normally 2 over the counter pills) every 4 hours with food.  Do not return to work until a day after your fever breaks.

## 2015-12-17 NOTE — ED Provider Notes (Signed)
[facility] DEPT Provider Note   CSN: [id number] Arrival date & time: 12/17/15  1251  By signing my name below, I, Ashley Freeman, attest that this documentation has been prepared under the direction and in the presence of  Delsa Grana, PA-C. Electronically Signed: Royce Freeman, ED Scribe. 12/17/15. 3:01 PM.   History   Chief Complaint Chief Complaint  Patient presents with  . Generalized Body Aches   The history is provided by the patient. No language interpreter was used.    HPI Comments:  Ashley Freeman is a 25 y.o. female who presents to the Emergency Department complaining of generalized body aches and sweating beginning a few days ago.  She states has been feeling hot then breaks into sweats.  She also notes associated nausea, loss of appetite, fatigue, and headache. She had a sore throat yesterday, but reports that it was gone today.  She has a h/o type 1 diabetes.  She checked her sugar this morning and it was 75, which is low for her.  Her friend was sick and had similar symptoms.  She deneies congestion and rhinorrhea.  No alleviating factors noted.  She denies SOB, congestion, rhinorrhea, ear pain, hematuria, dysuria, and back pain.  Past Medical History:  Diagnosis Date  . Diabetes mellitus 2001   Diagnosed at age 36 ; Type I  . DKA (diabetic ketoacidoses) ([place]) 08/19/2013  . Gonorrhea 08/2011   Treated in 09/2011    Patient Active Problem List   Diagnosis Date Noted  . DKA, type 1 ([place]) 11/24/2015  . BV (bacterial vaginosis) 11/24/2015  . Chest pain 11/24/2015  . AKI (acute kidney injury) ([place]) 07/26/2014  . Infection of urinary tract 04/08/2014  . PID (acute pelvic inflammatory disease) 04/06/2014  . Diabetic ketoacidosis without coma associated with type 1 diabetes mellitus ([place]) 09/19/2013  . Infected cyst of Bartholin's gland duct 09/19/2013  . Bartholin's gland abscess 09/19/2013  . Sepsis ([place]) 09/19/2013  . Abscess, gluteal, right  08/24/2013  . Health care maintenance 08/24/2013  . Hypophosphatemia 08/18/2013  . Hypomagnesemia 08/18/2013  . Leukocytosis, unspecified 08/17/2013  . Fever, unspecified 08/17/2013  . H/O medication noncompliance 08/12/2013  . Hyponatremia 08/04/2012  . Anemia 02/19/2012  . Hyperglycemia 11/06/2011  . Sinus tachycardia ([place]) 10/17/2011  . Diabetes mellitus type 1, uncontrolled, with complications ([facility]) 123XX123    Past Surgical History:  Procedure Laterality Date  . INCISION AND DRAINAGE PERIRECTAL ABSCESS Right 08/18/2013   Procedure: IRRIGATION AND DEBRIDEMENT GLUTEAL ABSCESS;  Surgeon: Ralene Ok, MD;  Location: [place];  Service: General;  Laterality: Right;  . INCISION AND DRAINAGE PERIRECTAL ABSCESS Right 09/19/2013   Procedure: IRRIGATION AND DEBRIDEMENT RIGHT GLUTEAL AND LABIAL ABSCESSES;  Surgeon: Ralene Ok, MD;  Location: [facility];  Service: General;  Laterality: Right;  . INCISION AND DRAINAGE PERIRECTAL ABSCESS Right 09/24/2013   Procedure: IRRIGATION AND DEBRIDEMENT PERIRECTAL ABSCESS;  Surgeon: Gwenyth Ober, MD;  Location: [place] [facility];  Service: General;  Laterality: Right;  . NO PAST SURGERIES      OB History    Gravida Para Term Preterm AB Living   1 0     1 0   SAB TAB Ectopic Multiple Live Births   1               Home Medications    Prior to Admission medications   Medication Sig Start Date End Date Taking? Authorizing Provider  insulin aspart (NOVOLOG FLEXPEN) 100 UNIT/ML FlexPen Inject 6 Units into the skin 3 (  three) times daily with meals. 11/27/15   Allie Bossier, MD  Insulin Glargine (LANTUS) 100 UNIT/ML Solostar Pen Inject 20 Units into the skin at bedtime. 11/27/15   Allie Bossier, MD  metroNIDAZOLE (FLAGYL) 500 MG tablet Take 1 tablet (500 mg total) by mouth every 12 (twelve) hours. 11/27/15   Allie Bossier, MD    Family History Family History  Problem Relation Age of Onset  . Asthma Mother   . Gout Father   . Diabetes Paternal Grandmother     . Anesthesia problems Neg Hx     Social History Social History  Substance Use Topics  . Smoking status: Never Smoker  . Smokeless tobacco: Never Used  . Alcohol use No     Comment: occ     Allergies   Penicillins   Review of Systems Review of Systems  All other systems reviewed and are negative.    Physical Exam Updated Vital Signs BP 124/85 (BP Location: Left Arm)   Pulse 88   Temp 98.8 F (37.1 C) (Oral)   Resp 18   Ht 5\' 3"  (1.6 m)   Wt 117 lb 14.4 oz (53.5 kg)   LMP 12/13/2015   SpO2 99%   BMI 20.89 kg/m   Physical Exam  Constitutional: She appears well-developed and well-nourished.  HENT:  Head: Normocephalic.  Eyes: Conjunctivae are normal.  Cardiovascular: Normal rate.   Pulmonary/Chest: Effort normal. No respiratory distress.  Abdominal: She exhibits no distension.  Musculoskeletal: Normal range of motion.  Neurological: She is alert.  Skin: Skin is warm and dry.  Psychiatric: She has a normal mood and affect. Her behavior is normal.  Nursing note and vitals reviewed.    ED Treatments / Results   DIAGNOSTIC STUDIES:  Oxygen Saturation is 99% on RA, nml by my interpretation.    COORDINATION OF CARE:  3:01 PM Discussed treatment plan with pt at bedside and pt agreed to plan.   Labs (all labs ordered are listed, but only abnormal results are displayed) Labs Reviewed  CBG MONITORING, ED - Abnormal; Notable for the following:       Result Value   Glucose-Capillary 213 (*)    All other components within normal limits  RAPID STREP SCREEN (NOT AT [facility])    EKG  EKG Interpretation None       Radiology No results found.  Procedures Procedures (including critical care time)  Medications Ordered in ED Medications - No data to display   Initial Impression / Assessment and Plan / ED Course  I have reviewed the triage vital signs and the nursing notes.  Pertinent labs & imaging results that were available during my care of the  patient were reviewed by me and considered in my medical decision making (see chart for details).  Clinical Course    PT with T1DM, young female, complains of generalized body aches and sweats, no focal sx, + sick contacts.  cbg monitoring, cxr, ua and rapid strep ordered.  All negative, likely viral syndrome.  VSS.  Final Clinical Impressions(s) / ED Diagnoses   Final diagnoses:  Viral syndrome  URI (upper respiratory infection)    New Prescriptions Discharge Medication List as of 12/17/2015  5:27 PM     I personally performed the services described in this documentation, which was scribed in my presence. The recorded information has been reviewed and is accurate.       Delsa Grana, PA-C 01/05/16 [place], MD 01/11/16 [phone]

## 2015-12-17 NOTE — ED Notes (Signed)
Declined W/C at D/C and was escorted to lobby by RN. 

## 2015-12-19 LAB — CULTURE, GROUP A STREP (THRC)

## 2015-12-23 ENCOUNTER — Emergency Department (HOSPITAL_COMMUNITY)
Admission: EM | Admit: 2015-12-23 | Discharge: 2015-12-23 | Disposition: A | Discharge status: Home / Self Care | Payer: Medicaid Other | Attending: Emergency Medicine | Admitting: Emergency Medicine

## 2015-12-23 ENCOUNTER — Encounter (HOSPITAL_COMMUNITY): Payer: Self-pay | Admitting: Emergency Medicine

## 2015-12-23 DIAGNOSIS — E1065 Type 1 diabetes mellitus with hyperglycemia: Secondary | ICD-10-CM | POA: Insufficient documentation

## 2015-12-23 DIAGNOSIS — B373 Candidiasis of vulva and vagina: Secondary | ICD-10-CM | POA: Insufficient documentation

## 2015-12-23 DIAGNOSIS — B3731 Acute candidiasis of vulva and vagina: Secondary | ICD-10-CM

## 2015-12-23 DIAGNOSIS — R102 Pelvic and perineal pain: Secondary | ICD-10-CM | POA: Insufficient documentation

## 2015-12-23 DIAGNOSIS — R739 Hyperglycemia, unspecified: Secondary | ICD-10-CM

## 2015-12-23 LAB — CBC
HCT: 40.1 % (ref 36.0–46.0)
Hemoglobin: 13.3 g/dL (ref 12.0–15.0)
MCH: 27.7 pg (ref 26.0–34.0)
MCHC: 33.2 g/dL (ref 30.0–36.0)
MCV: 83.4 fL (ref 78.0–100.0)
Platelets: 308 10*3/uL (ref 150–400)
RBC: 4.81 MIL/uL (ref 3.87–5.11)
RDW: 13 % (ref 11.5–15.5)
WBC: 5.1 10*3/uL (ref 4.0–10.5)

## 2015-12-23 LAB — URINE MICROSCOPIC-ADD ON: RBC / HPF: NONE SEEN RBC/hpf (ref 0–5)

## 2015-12-23 LAB — URINALYSIS, ROUTINE W REFLEX MICROSCOPIC
BILIRUBIN URINE: NEGATIVE
Glucose, UA: >1000 mg/dL — AB
Hgb urine dipstick: NEGATIVE
KETONES UR: NEGATIVE mg/dL
Leukocytes, UA: NEGATIVE
Nitrite: NEGATIVE
Protein, ur: NEGATIVE mg/dL
Specific Gravity, Urine: 1.037 — ABNORMAL HIGH (ref 1.005–1.030)
pH: 6.5 (ref 5.0–8.0)

## 2015-12-23 LAB — COMPREHENSIVE METABOLIC PANEL
ALK PHOS: 63 U/L (ref 38–126)
ALT: 12 U/L — ABNORMAL LOW (ref 14–54)
ANION GAP: 7 (ref 5–15)
AST: 21 U/L (ref 15–41)
Albumin: 3.8 g/dL (ref 3.5–5.0)
BILIRUBIN TOTAL: 0.5 mg/dL (ref 0.3–1.2)
BUN: 9 mg/dL (ref 6–20)
CALCIUM: 9.4 mg/dL (ref 8.9–10.3)
CO2: 23 mmol/L (ref 22–32)
Chloride: 104 mmol/L (ref 101–111)
Creatinine, Ser: 0.57 mg/dL (ref 0.44–1.00)
GFR calc Af Amer: >60 mL/min (ref >60)
GLUCOSE: 400 mg/dL — AB (ref 65–99)
Potassium: 4.2 mmol/L (ref 3.5–5.1)
Sodium: 134 mmol/L — ABNORMAL LOW (ref 135–145)
TOTAL PROTEIN: 7.1 g/dL (ref 6.5–8.1)

## 2015-12-23 LAB — WET PREP, GENITAL
Sperm: NONE SEEN
Trich, Wet Prep: NONE SEEN
Yeast Wet Prep HPF POC: NONE SEEN

## 2015-12-23 LAB — CBG MONITORING, ED: GLUCOSE-CAPILLARY: 261 mg/dL — AB (ref 65–99)

## 2015-12-23 LAB — PREGNANCY, URINE: PREG TEST UR: NEGATIVE

## 2015-12-23 LAB — LIPASE, BLOOD: Lipase: 19 U/L (ref 11–51)

## 2015-12-23 MED ORDER — HYDROCODONE-ACETAMINOPHEN 5-325 MG PO TABS
1.0000 | ORAL_TABLET | Freq: Once | ORAL | Status: AC
  Administered 2015-12-23: 1 via ORAL
  Filled 2015-12-23: qty 1

## 2015-12-23 MED ORDER — TRAMADOL HCL 50 MG PO TABS: 50.0000 mg | ORAL_TABLET | Freq: Four times a day (QID) | ORAL | 0 refills | Status: DC | PRN

## 2015-12-23 MED ORDER — FLUCONAZOLE 100 MG PO TABS
150.0000 mg | ORAL_TABLET | Freq: Once | ORAL | Status: AC
  Administered 2015-12-23: 150 mg via ORAL
  Filled 2015-12-23: qty 2

## 2015-12-23 MED ORDER — FLUCONAZOLE 150 MG PO TABS: 150.0000 mg | ORAL_TABLET | Freq: Every day | ORAL | 0 refills | Status: AC

## 2015-12-23 MED ORDER — IBUPROFEN 400 MG PO TABS: 400.0000 mg | ORAL_TABLET | Freq: Four times a day (QID) | ORAL | 0 refills | Status: DC | PRN

## 2015-12-23 MED ORDER — INSULIN ASPART 100 UNIT/ML ~~LOC~~ SOLN
8.0000 [IU] | Freq: Once | SUBCUTANEOUS | Status: AC
  Administered 2015-12-23: 8 [IU] via SUBCUTANEOUS
  Filled 2015-12-23: qty 1

## 2015-12-23 NOTE — ED Provider Notes (Signed)
[facility] DEPT Provider Note   CSN: [id number] Arrival date & time: 12/23/15  1324     History   Chief Complaint Chief Complaint  Patient presents with  . Abdominal Pain  . Back Pain  . Vaginal Itching    HPI Ashley Freeman is a 25 y.o. female.  HPI Ashley Freeman is a 25 y.o. female with type 1 diabetes, history of STDs, presents to emergency department complaining of lower abdominal pain and lower back pain. Patient states she has had pain in her abdomen and back for approximately a week. It has worsened in the last few days. Denies any nausea or vomiting. Denies any fever or chills. States she has been taking ibuprofen but it has not been helping her. She states that she believes that she may have STDs, states that her partner told her that he cheated on her. She is unsure of her pregnancy status. Last menstrual cycle was 10 days ago and was normal. Reports some vaginal itching, denies any discharge or bleeding at this time. She has no other complaints.  Past Medical History:  Diagnosis Date  . Diabetes mellitus 2001   Diagnosed at age 79 ; Type I  . DKA (diabetic ketoacidoses) ([place]) 08/19/2013  . Gonorrhea 08/2011   Treated in 09/2011    Patient Active Problem List   Diagnosis Date Noted  . DKA, type 1 ([place]) 11/24/2015  . BV (bacterial vaginosis) 11/24/2015  . Chest pain 11/24/2015  . AKI (acute kidney injury) ([place]) 07/26/2014  . Infection of urinary tract 04/08/2014  . PID (acute pelvic inflammatory disease) 04/06/2014  . Diabetic ketoacidosis without coma associated with type 1 diabetes mellitus ([place]) 09/19/2013  . Infected cyst of Bartholin's gland duct 09/19/2013  . Bartholin's gland abscess 09/19/2013  . Sepsis ([place]) 09/19/2013  . Abscess, gluteal, right 08/24/2013  . Health care maintenance 08/24/2013  . Hypophosphatemia 08/18/2013  . Hypomagnesemia 08/18/2013  . Leukocytosis, unspecified 08/17/2013  . Fever, unspecified 08/17/2013  . H/O  medication noncompliance 08/12/2013  . Hyponatremia 08/04/2012  . Anemia 02/19/2012  . Hyperglycemia 11/06/2011  . Sinus tachycardia ([place]) 10/17/2011  . Diabetes mellitus type 1, uncontrolled, with complications ([place]) 123XX123    Past Surgical History:  Procedure Laterality Date  . INCISION AND DRAINAGE PERIRECTAL ABSCESS Right 08/18/2013   Procedure: IRRIGATION AND DEBRIDEMENT GLUTEAL ABSCESS;  Surgeon: Ralene Ok, MD;  Location: [place];  Service: General;  Laterality: Right;  . INCISION AND DRAINAGE PERIRECTAL ABSCESS Right 09/19/2013   Procedure: IRRIGATION AND DEBRIDEMENT RIGHT GLUTEAL AND LABIAL ABSCESSES;  Surgeon: Ralene Ok, MD;  Location: [place];  Service: General;  Laterality: Right;  . INCISION AND DRAINAGE PERIRECTAL ABSCESS Right 09/24/2013   Procedure: IRRIGATION AND DEBRIDEMENT PERIRECTAL ABSCESS;  Surgeon: Gwenyth Ober, MD;  Location: [facility] [place];  Service: General;  Laterality: Right;  . NO PAST SURGERIES      OB History    Gravida Para Term Preterm AB Living   1 0     1 0   SAB TAB Ectopic Multiple Live Births   1               Home Medications    Prior to Admission medications   Medication Sig Start Date End Date Taking? Authorizing Provider  insulin aspart (NOVOLOG FLEXPEN) 100 UNIT/ML FlexPen Inject 6 Units into the skin 3 (three) times daily with meals. 11/27/15  Yes Allie Bossier, MD  Insulin Glargine (LANTUS) 100 UNIT/ML Solostar Pen Inject 20 Units into  the skin at bedtime. Patient taking differently: Inject 20 Units into the skin every morning.  11/27/15  Yes Allie Bossier, MD  metroNIDAZOLE (FLAGYL) 500 MG tablet Take 1 tablet (500 mg total) by mouth every 12 (twelve) hours. Patient not taking: Reported on 12/23/2015 11/27/15   Allie Bossier, MD    Family History Family History  Problem Relation Age of Onset  . Asthma Mother   . Gout Father   . Diabetes Paternal Grandmother   . Anesthesia problems Neg Hx     Social History Social History    Substance Use Topics  . Smoking status: Never Smoker  . Smokeless tobacco: Never Used  . Alcohol use No     Comment: occ     Allergies   Penicillins   Review of Systems Review of Systems  Constitutional: Negative for chills and fever.  Respiratory: Negative for cough, chest tightness and shortness of breath.   Cardiovascular: Negative for chest pain, palpitations and leg swelling.  Gastrointestinal: Positive for abdominal pain. Negative for diarrhea, nausea and vomiting.  Genitourinary: Positive for pelvic pain. Negative for dysuria, flank pain, vaginal bleeding, vaginal discharge and vaginal pain.  Musculoskeletal: Positive for back pain. Negative for arthralgias, myalgias, neck pain and neck stiffness.  Skin: Negative for rash.  Neurological: Negative for dizziness, weakness and headaches.  All other systems reviewed and are negative.    Physical Exam Updated Vital Signs BP 136/91 (BP Location: Right Arm)   Pulse 93   Temp 98.1 F (36.7 C) (Oral)   Resp 16   LMP 12/13/2015 (Exact Date)   SpO2 100%   Physical Exam  Constitutional: She appears well-developed and well-nourished. No distress.  HENT:  Head: Normocephalic.  Eyes: Conjunctivae are normal.  Neck: Neck supple.  Cardiovascular: Normal rate, regular rhythm and normal heart sounds.   Pulmonary/Chest: Effort normal and breath sounds normal. No respiratory distress. She has no wheezes. She has no rales.  Abdominal: Soft. Bowel sounds are normal. She exhibits no distension. There is tenderness. There is no rebound.  Suprapubic tenderness. No bilateral CVA tenderness  Genitourinary:  Genitourinary Comments: Normal external genitalia. Mild vaginal irritation noted. [place] cheese like discharge present. No cervical motion tenderness. No uterine or adnexal tenderness.  Musculoskeletal: She exhibits no edema.  Neurological: She is alert.  Skin: Skin is warm and dry. No pallor.  Psychiatric: She has a normal  mood and affect. Her behavior is normal.  Nursing note and vitals reviewed.    ED Treatments / Results  Labs (all labs ordered are listed, but only abnormal results are displayed) Labs Reviewed  COMPREHENSIVE METABOLIC PANEL - Abnormal; Notable for the following:       Result Value   Sodium 134 (*)    Glucose, Bld 400 (*)    ALT 12 (*)    All other components within normal limits  URINALYSIS, ROUTINE W REFLEX MICROSCOPIC (NOT AT [facility]) - Abnormal; Notable for the following:    APPearance CLOUDY (*)    Specific Gravity, Urine 1.037 (*)    Glucose, UA >1000 (*)    All other components within normal limits  URINE MICROSCOPIC-ADD ON - Abnormal; Notable for the following:    Squamous Epithelial / LPF 6-30 (*)    Bacteria, UA FEW (*)    All other components within normal limits  WET PREP, GENITAL  LIPASE, BLOOD  CBC  I-STAT BETA HCG BLOOD, ED ([facility], [facility], [facility] ONLY)  GC/CHLAMYDIA PROBE AMP ([place]) NOT AT  [facility]    EKG  EKG Interpretation None       Radiology No results found.  Procedures Procedures (including critical care time)  Medications Ordered in ED Medications  insulin aspart (novoLOG) injection 8 Units (not administered)  HYDROcodone-acetaminophen (NORCO/VICODIN) 5-325 MG per tablet 1 tablet (not administered)     Initial Impression / Assessment and Plan / ED Course  I have reviewed the triage vital signs and the nursing notes.  Pertinent labs & imaging results that were available during my care of the patient were reviewed by me and considered in my medical decision making (see chart for details).  Clinical Course  Comment By Time  Patient seen and examined. Patient has been in the waiting room for 7 hours up until now. Here with lower abdominal pain and lower back pain. She does not appear to be in any distress on exam. Vital signs are normal. She does have lower abdominal tenderness. Will perform pelvic exam. Her blood sugars 400, insulin ordered. Does not  appear to be DKA based on labs, no ketones in urine, anion gap of 7. Patient has no pregnancy test ordered. Will add that as well. Jeannett Senior, PA-C 08/25 2031  Pelvic exam not suggestive of PID or cervicitis. With no cervical motion tenderness. Her exam is consistent with high yeast infection. Will treat with Diflucan. He's noted in urine as well. Pregnancy test is negative. Blood sugar is down to 261 after 8 units of insulin. Patient is requesting to be discharged home. We'll treat her pain with ibuprofen and tramadol. Will follow-up with primary care doctor. Jeannett Senior, PA-C 08/25 2213      Final Clinical Impressions(s) / ED Diagnoses   Final diagnoses:  Hyperglycemia  Vagina, candidiasis  Pelvic pain in female    New Prescriptions New Prescriptions   FLUCONAZOLE (DIFLUCAN) 150 MG TABLET    Take 1 tablet (150 mg total) by mouth daily.   IBUPROFEN (ADVIL,MOTRIN) 400 MG TABLET    Take 1 tablet (400 mg total) by mouth every 6 (six) hours as needed.   TRAMADOL (ULTRAM) 50 MG TABLET    Take 1 tablet (50 mg total) by mouth every 6 (six) hours as needed.     Jeannett Senior, PA-C 12/23/15 2218    Milton Ferguson, MD 12/24/15 1228

## 2015-12-23 NOTE — ED Notes (Signed)
K[place] PA at the bedside.

## 2015-12-23 NOTE — Discharge Instructions (Signed)
Make sure to keep close eye on your blood sugars. Follow up with your doctor for recheck. Take another diflucan in 1-2 days. Follow up with primary care doctor. Your gonorrhea and chlamydia cultures are pending and if return abnormal you will be contacted.

## 2015-12-23 NOTE — ED Triage Notes (Signed)
Pt from home with c/o generalized abdominal and bilateral back pain x 3 days.  Hx of the same with UTI.  Pt additionally reports itching with urination.  Pt in NAD, A&O, ambulatory.

## 2015-12-23 NOTE — ED Notes (Signed)
Pt c/o vaginal itching. Denies vaginal discharge or bleeding at this time.

## 2015-12-26 LAB — GC/CHLAMYDIA PROBE AMP (~~LOC~~) NOT AT ARMC
Chlamydia: NEGATIVE
NEISSERIA GONORRHEA: NEGATIVE

## 2016-01-16 ENCOUNTER — Ambulatory Visit: Payer: Self-pay | Attending: Family Medicine | Admitting: Family Medicine

## 2016-01-16 ENCOUNTER — Encounter: Payer: Self-pay | Admitting: Family Medicine

## 2016-01-16 VITALS — BP 112/76 | HR 109 | Temp 98.8°F | Resp 18 | Ht 64.0 in | Wt 116.8 lb

## 2016-01-16 DIAGNOSIS — E108 Type 1 diabetes mellitus with unspecified complications: Secondary | ICD-10-CM

## 2016-01-16 DIAGNOSIS — Z794 Long term (current) use of insulin: Secondary | ICD-10-CM | POA: Insufficient documentation

## 2016-01-16 DIAGNOSIS — Z8619 Personal history of other infectious and parasitic diseases: Secondary | ICD-10-CM | POA: Insufficient documentation

## 2016-01-16 DIAGNOSIS — Z23 Encounter for immunization: Secondary | ICD-10-CM | POA: Insufficient documentation

## 2016-01-16 DIAGNOSIS — E1010 Type 1 diabetes mellitus with ketoacidosis without coma: Secondary | ICD-10-CM | POA: Insufficient documentation

## 2016-01-16 DIAGNOSIS — E1049 Type 1 diabetes mellitus with other diabetic neurological complication: Secondary | ICD-10-CM

## 2016-01-16 DIAGNOSIS — E1140 Type 2 diabetes mellitus with diabetic neuropathy, unspecified: Secondary | ICD-10-CM | POA: Insufficient documentation

## 2016-01-16 DIAGNOSIS — IMO0002 Reserved for concepts with insufficient information to code with codable children: Secondary | ICD-10-CM

## 2016-01-16 DIAGNOSIS — E1065 Type 1 diabetes mellitus with hyperglycemia: Secondary | ICD-10-CM

## 2016-01-16 DIAGNOSIS — Z0000 Encounter for general adult medical examination without abnormal findings: Secondary | ICD-10-CM

## 2016-01-16 LAB — POCT GLYCOSYLATED HEMOGLOBIN (HGB A1C)

## 2016-01-16 LAB — GLUCOSE, POCT (MANUAL RESULT ENTRY): POC GLUCOSE: 345 mg/dL — AB (ref 70–99)

## 2016-01-16 MED ORDER — TRUEPLUS LANCETS 28G MISC: 1.0000 | Freq: Three times a day (TID) | 12 refills | Status: DC

## 2016-01-16 MED ORDER — INSULIN GLARGINE 100 UNIT/ML SOLOSTAR PEN: 20.0000 [IU] | PEN_INJECTOR | Freq: Every day | SUBCUTANEOUS | 11 refills | Status: DC

## 2016-01-16 MED ORDER — INSULIN ASPART 100 UNIT/ML FLEXPEN: 0.0000 [IU] | PEN_INJECTOR | Freq: Three times a day (TID) | SUBCUTANEOUS | 11 refills | Status: DC

## 2016-01-16 MED ORDER — GABAPENTIN 300 MG PO CAPS: 300.0000 mg | ORAL_CAPSULE | Freq: Two times a day (BID) | ORAL | 3 refills | Status: DC

## 2016-01-16 MED ORDER — TRUE METRIX METER DEVI: 1.0000 | Freq: Three times a day (TID) | 0 refills | Status: DC

## 2016-01-16 MED ORDER — GLUCOSE BLOOD VI STRP: ORAL_STRIP | 12 refills | Status: DC

## 2016-01-16 NOTE — Progress Notes (Signed)
Subjective:  Patient ID: Ashley Freeman, female    DOB: 1991-04-17  Age: 25 y.o. MRN: [id number]  CC: Diabetes   HPI Ashley Freeman is a 25 year old female with a history of type 1 diabetes mellitus and noncompliance who comes into the clinic to establish care.  She does not have her blood sugar log and informs me her glucometer is broken and she has been unable to check her sugars. Her blood sugar in the clinic is elevated at 345 and she informs me she took her 20 units of Lantus last night but this morning felt her blood sugar was low even though she did not check it and so she skipped her NovoLog dose but then went on to eat his breakfast. She tells me "I can feel my blood sugar when it is high or low".  Complains of pain and numbness in her legs and feet for several weeks but denies numbness in her hands.   Past Medical History:  Diagnosis Date  . Diabetes mellitus 2001   Diagnosed at age 55 ; Type I  . DKA (diabetic ketoacidoses) ([place]) 08/19/2013  . Gonorrhea 08/2011   Treated in 09/2011    Past Surgical History:  Procedure Laterality Date  . INCISION AND DRAINAGE PERIRECTAL ABSCESS Right 08/18/2013   Procedure: IRRIGATION AND DEBRIDEMENT GLUTEAL ABSCESS;  Surgeon: Ralene Ok, MD;  Location: [facility];  Service: General;  Laterality: Right;  . INCISION AND DRAINAGE PERIRECTAL ABSCESS Right 09/19/2013   Procedure: IRRIGATION AND DEBRIDEMENT RIGHT GLUTEAL AND LABIAL ABSCESSES;  Surgeon: Ralene Ok, MD;  Location: [facility];  Service: General;  Laterality: Right;  . INCISION AND DRAINAGE PERIRECTAL ABSCESS Right 09/24/2013   Procedure: IRRIGATION AND DEBRIDEMENT PERIRECTAL ABSCESS;  Surgeon: Gwenyth Ober, MD;  Location: [facility];  Service: General;  Laterality: Right;  . NO PAST SURGERIES       Outpatient Medications Prior to Visit  Medication Sig Dispense Refill  . ibuprofen (ADVIL,MOTRIN) 400 MG tablet Take 1 tablet (400 mg total) by mouth every 6 (six) hours as  needed. 20 tablet 0  . insulin aspart (NOVOLOG FLEXPEN) 100 UNIT/ML FlexPen Inject 6 Units into the skin 3 (three) times daily with meals. 15 mL 11  . Insulin Glargine (LANTUS) 100 UNIT/ML Solostar Pen Inject 20 Units into the skin at bedtime. (Patient taking differently: Inject 20 Units into the skin every morning. ) 15 mL 11  . metroNIDAZOLE (FLAGYL) 500 MG tablet Take 1 tablet (500 mg total) by mouth every 12 (twelve) hours. 8 tablet 0  . traMADol (ULTRAM) 50 MG tablet Take 1 tablet (50 mg total) by mouth every 6 (six) hours as needed. (Patient not taking: Reported on 01/16/2016) 15 tablet 0   Facility-Administered Medications Prior to Visit  Medication Dose Route Frequency Provider Last Rate Last Dose  . Insulin Pen Needle (NOVOFINE) 200 each  20 packet Subcutaneous PRN Allie Bossier, MD        ROS Review of Systems  Constitutional: Negative for activity change, appetite change and fatigue.  HENT: Negative for congestion, sinus pressure and sore throat.   Eyes: Negative for visual disturbance.  Respiratory: Negative for cough, chest tightness, shortness of breath and wheezing.   Cardiovascular: Negative for chest pain and palpitations.  Gastrointestinal: Negative for abdominal distention, abdominal pain and constipation.  Endocrine: Negative for polydipsia.  Genitourinary: Negative for dysuria and frequency.  Musculoskeletal: Negative for arthralgias and back pain.  Skin: Negative for rash.  Neurological: Positive for numbness.  Negative for tremors and light-headedness.  Hematological: Does not bruise/bleed easily.  Psychiatric/Behavioral: Negative for agitation and behavioral problems.    Objective:  BP 112/76 (BP Location: Left Arm, Patient Position: Sitting, Cuff Size: Normal)   Pulse (!) 109   Temp 98.8 F (37.1 C) (Oral)   Resp 18   Ht 5\' 4"  (1.626 m)   Wt 116 lb 12.8 oz (53 kg)   LMP 01/03/2016   SpO2 99%   BMI 20.05 kg/m   BP/Weight 01/16/2016 12/23/2015 4/78/2956    Systolic BP 213 086 578  Diastolic BP 76 93 88  Wt. (Lbs) 116.8 - 117.9  BMI 20.05 - 20.89      Physical Exam  Constitutional: She is oriented to person, place, and time. She appears well-developed and well-nourished.  Cardiovascular: Normal rate, normal heart sounds and intact distal pulses.   No murmur heard. Pulmonary/Chest: Effort normal and breath sounds normal. She has no wheezes. She has no rales. She exhibits no tenderness.  Abdominal: Soft. Bowel sounds are normal. She exhibits no distension and no mass. There is no tenderness.  Musculoskeletal: Normal range of motion.  Neurological: She is alert and oriented to person, place, and time.     Assessment & Plan:   1. Diabetes mellitus type 1, uncontrolled, with complications ([place]) Unable to read A1c on meter; last A1c was 16.2 in 06/2014 Switched from fixed NovoLog regimen to NovoLog sliding scale Continue Lantus and will review blood sugar log for next visit - POCT A1C - Glucose (CBG) - Insulin Glargine (LANTUS) 100 UNIT/ML Solostar Pen; Inject 20 Units into the skin at bedtime.  Dispense: 15 mL; Refill: 11 - Blood Glucose Monitoring Suppl (TRUE METRIX METER) DEVI; 1 each by Does not apply route 3 (three) times daily before meals.  Dispense: 1 Device; Refill: 0 - TRUEPLUS LANCETS 28G MISC; 1 each by Does not apply route 3 (three) times daily before meals.  Dispense: 100 each; Refill: 12 - glucose blood (TRUE METRIX BLOOD GLUCOSE TEST) test strip; Use 3 times daily before meals  Dispense: 100 each; Refill: 12 - Hemoglobin A1c  2. Healthcare maintenance - Flu Vaccine QUAD 36+ mos PF IM (Fluarix & Fluzone Quad PF)  3. 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 Insulin Glargine (LANTUS) 100 UNIT/ML Solostar Pen    Sig: Inject 20 Units into the skin at bedtime.    Dispense:  15  mL    Refill:  11  . Blood Glucose Monitoring Suppl (TRUE METRIX METER) DEVI    Sig: 1 each by Does not apply route 3 (three) times daily before meals.    Dispense:  1 Device    Refill:  0  . TRUEPLUS LANCETS 28G MISC    Sig: 1 each by Does not apply route 3 (three) times daily before meals.    Dispense:  100 each    Refill:  12  . glucose blood (TRUE METRIX BLOOD GLUCOSE TEST) test strip    Sig: Use 3 times daily before meals    Dispense:  100 each    Refill:  12  . insulin aspart (NOVOLOG FLEXPEN) 100 UNIT/ML FlexPen    Sig: Inject 0-12 Units into the skin 3 (three) times daily with meals. As per sliding scale    Dispense:  15 mL    Refill:  11  . gabapentin (NEURONTIN) 300 MG capsule  Sig: Take 1 capsule (300 mg total) by mouth 2 (two) times daily.    Dispense:  60 capsule    Refill:  3    Follow-up: Return in about 2 weeks (around 01/30/2016) for follow up on Diabetes mellitus.   This note has been created with Surveyor, quantity. Any transcriptional errors are unintentional.    Arnoldo Morale MD

## 2016-01-16 NOTE — Progress Notes (Signed)
Patient is here for DM FU  Patient denies pain at this time.  Patient ate eggs, pork sausage and V8 today.  Patient has not taken medication today.  Patient denies any suicidal ideations at this time.  Patient tolerated flu vaccine well today.

## 2016-01-16 NOTE — Patient Instructions (Signed)
Diabetic Neuropathy Diabetic neuropathy is a nerve disease or nerve damage that is caused by diabetes mellitus. About half of all people with diabetes mellitus have some form of nerve damage. Nerve damage is more common in those who have had diabetes mellitus for many years and who generally have not had good control of their blood sugar (glucose) level. Diabetic neuropathy is a common complication of diabetes mellitus. There are three common types of diabetic neuropathy and a fourth type that is less common and less understood:   Peripheral neuropathy--This is the most common type of diabetic neuropathy. It causes damage to the nerves of the feet and legs first and then eventually the hands and arms.The damage affects the ability to sense touch.  Autonomic neuropathy--This type causes damage to the autonomic nervous system, which controls the following functions:  Heartbeat.  Body temperature.  Blood pressure.  Urination.  Digestion.  Sweating.  Sexual function.  Focal neuropathy--Focal neuropathy can be painful and unpredictable and occurs most often in older adults with diabetes mellitus. It involves a specific nerve or one area and often comes on suddenly. It usually does not cause long-term problems.  Radiculoplexus neuropathy-- Sometimes called lumbosacral radiculoplexus neuropathy, radiculoplexus neuropathy affects the nerves of the thighs, hips, buttocks, or legs. It is more common in people with type 2 diabetes mellitus and in older men. It is characterized by debilitating pain, weakness, and atrophy, usually in the thigh muscles. CAUSES  The cause of peripheral, autonomic, and focal neuropathies is diabetes mellitus that is uncontrolled and high glucose levels. The cause of radiculoplexus neuropathy is unknown. However, it is thought to be caused by inflammation related to uncontrolled glucose levels. SIGNS AND SYMPTOMS  Peripheral Neuropathy Peripheral neuropathy develops  slowly over time. When the nerves of the feet and legs no longer work there may be:   Burning, stabbing, or aching pain in the legs or feet.  Inability to feel pressure or pain in your feet. This can lead to:  Thick calluses over pressure areas.  Pressure sores.  Ulcers.  Foot deformities.  Reduced ability to feel temperature changes.  Muscle weakness. Autonomic Neuropathy The symptoms of autonomic neuropathy vary depending on which nerves are affected. Symptoms may include:  Problems with digestion, such as:  Feeling sick to your stomach (nausea).  Vomiting.  Bloating.  Constipation.  Diarrhea.  Abdominal pain.  Difficulty with urination. This occurs if you lose your ability to sense when your bladder is full. Problems include:  Urine leakage (incontinence).  Inability to empty your bladder completely (retention).  Rapid or irregular heartbeat (palpitations).  Blood pressure drops when you stand up (orthostatic hypotension). When you stand up you may feel:  Dizzy.  Weak.  Faint.  In men, inability to attain and maintain an erection.  In women, vaginal dryness and problems with decreased sexual desire and arousal.  Problems with body temperature regulation.  Increased or decreased sweating. Focal Neuropathy  Abnormal eye movements or abnormal alignment of both eyes.  Weakness in the wrist.  Foot drop. This results in an inability to lift the foot properly and abnormal walking or foot movement.  Paralysis on one side of your face (Bell palsy).  Chest or abdominal pain. Radiculoplexus Neuropathy  Sudden, severe pain in your hip, thigh, or buttocks.  Weakness and wasting of thigh muscles.  Difficulty rising from a seated position.  Abdominal swelling.  Unexplained weight loss (usually more than 10 lb [4.5 kg]). DIAGNOSIS  Peripheral Neuropathy Your senses may be   tested. Sensory function testing can be done with:  A light touch using a  monofilament.  A vibration with tuning fork.  A sharp sensation with a pin prick. Other tests that can help diagnose neuropathy are:  Nerve conduction velocity. This test checks the transmission of an electrical current through a nerve.  Electromyography. This shows how muscles respond to electrical signals transmitted by nearby nerves.  Quantitative sensory testing. This is used to assess how your nerves respond to vibrations and changes in temperature. Autonomic Neuropathy Diagnosis is often based on reported symptoms. Tell your health care provider if you experience:   Dizziness.   Constipation.   Diarrhea.   Inappropriate urination or inability to urinate.   Inability to get or maintain an erection.  Tests that may be done include:   Electrocardiography or Holter monitor. These are tests that can help show problems with the heart rate or heart rhythm.   An X-ray exam may be done. Focal Neuropathy Diagnosis is made based on your symptoms and what your health care provider finds during your exam. Other tests may be done. They may include:  Nerve conduction velocities. This checks the transmission of electrical current through a nerve.  Electromyography. This shows how muscles respond to electrical signals transmitted by nearby nerves.  Quantitative sensory testing. This test is used to assess how your nerves respond to vibration and changes in temperature. Radiculoplexus Neuropathy  Often the first thing is to eliminate any other issue or problems that might be the cause, as there is no stick test for diagnosis.  X-ray exam of your spine and lumbar region.  Spinal tap to rule out cancer.  MRI to rule out other lesions. TREATMENT  Once nerve damage occurs, it cannot be reversed. The goal of treatment is to keep the disease or nerve damage from getting worse and affecting more nerve fibers. Controlling your blood glucose level is the key. Most people with  radiculoplexus neuropathy see at least a partial improvement over time. You will need to keep your blood glucose and HbA1c levels in the target range determined by your health care provider. Things that help control blood glucose levels include:   Blood glucose monitoring.   Meal planning.   Physical activity.   Diabetes medicine.  Over time, maintaining lower blood glucose levels helps lessen symptoms. Sometimes, prescription pain medicine is needed. HOME CARE INSTRUCTIONS:  Do not smoke.  Keep your blood glucose level in the range that you and your health care provider have determined acceptable for you.  Keep your blood pressure level in the range that you and your health care provider have determined acceptable for you.  Eat a well-balanced diet.  Be physically active every day. Include strength training and balance exercises.  Protect your feet.  Check your feet every day for sores, cuts, blisters, or signs of infection.  Wear padded socks and supportive shoes. Use orthotic inserts, if necessary.  Regularly check the insides of your shoes for worn spots. Make sure there are no rocks or other items inside your shoes before you put them on. SEEK MEDICAL CARE IF:   You have burning, stabbing, or aching pain in the legs or feet.  You are unable to feel pressure or pain in your feet.  You develop problems with digestion such as:  Nausea.  Vomiting.  Bloating.  Constipation.  Diarrhea.  Abdominal pain.  You have difficulty with urination, such as:  Incontinence.  Retention.  You have palpitations.  You   develop orthostatic hypotension. When you stand up you may feel:  Dizzy.  Weak.  Faint.  You cannot attain and maintain an erection (in men).  You have vaginal dryness and problems with decreased sexual desire and arousal (in women).  You have severe pain in your thighs, legs, or buttocks.  You have unexplained weight loss.   This information  is not intended to replace advice given to you by your health care provider. Make sure you discuss any questions you have with your health care provider.   Document Released: 06/25/2001 Document Revised: 05/07/2014 Document Reviewed: 09/25/2012 Elsevier Interactive Patient Education 2016 [employer]  

## 2016-01-17 LAB — HEMOGLOBIN A1C: Hgb A1c MFr Bld: >14 % — ABNORMAL HIGH (ref <5.7)

## 2016-01-18 ENCOUNTER — Telehealth: Payer: Self-pay

## 2016-01-18 NOTE — Telephone Encounter (Signed)
Patient hipaa verif per guidelines.  RN advised patient of recent HgbA1C.  Patient verbalized understanding and no further questions at this time.

## 2016-01-18 NOTE — Telephone Encounter (Signed)
-----   Message from Arnoldo Morale, MD sent at 01/17/2016  2:30 PM EDT ----- Hba1c is 14.0

## 2016-02-13 ENCOUNTER — Emergency Department (HOSPITAL_COMMUNITY): Payer: Medicaid Other

## 2016-02-13 ENCOUNTER — Emergency Department (HOSPITAL_COMMUNITY)
Admission: EM | Admit: 2016-02-13 | Discharge: 2016-02-13 | Discharge status: Against Medical Advice | Payer: Medicaid Other | Attending: Emergency Medicine | Admitting: Emergency Medicine

## 2016-02-13 ENCOUNTER — Encounter (HOSPITAL_COMMUNITY): Payer: Self-pay | Admitting: *Deleted

## 2016-02-13 DIAGNOSIS — N760 Acute vaginitis: Secondary | ICD-10-CM

## 2016-02-13 DIAGNOSIS — R1033 Periumbilical pain: Secondary | ICD-10-CM

## 2016-02-13 DIAGNOSIS — Z794 Long term (current) use of insulin: Secondary | ICD-10-CM | POA: Insufficient documentation

## 2016-02-13 DIAGNOSIS — E1040 Type 1 diabetes mellitus with diabetic neuropathy, unspecified: Secondary | ICD-10-CM | POA: Insufficient documentation

## 2016-02-13 DIAGNOSIS — Z79899 Other long term (current) drug therapy: Secondary | ICD-10-CM | POA: Insufficient documentation

## 2016-02-13 DIAGNOSIS — R1030 Lower abdominal pain, unspecified: Secondary | ICD-10-CM | POA: Insufficient documentation

## 2016-02-13 DIAGNOSIS — N939 Abnormal uterine and vaginal bleeding, unspecified: Secondary | ICD-10-CM

## 2016-02-13 DIAGNOSIS — R739 Hyperglycemia, unspecified: Secondary | ICD-10-CM

## 2016-02-13 DIAGNOSIS — E1065 Type 1 diabetes mellitus with hyperglycemia: Secondary | ICD-10-CM | POA: Insufficient documentation

## 2016-02-13 DIAGNOSIS — B9689 Other specified bacterial agents as the cause of diseases classified elsewhere: Secondary | ICD-10-CM | POA: Insufficient documentation

## 2016-02-13 LAB — CBC
HCT: 41.4 % (ref 36.0–46.0)
Hemoglobin: 14.5 g/dL (ref 12.0–15.0)
MCH: 27.9 pg (ref 26.0–34.0)
MCHC: 35 g/dL (ref 30.0–36.0)
MCV: 79.6 fL (ref 78.0–100.0)
Platelets: 314 10*3/uL (ref 150–400)
RBC: 5.2 MIL/uL — AB (ref 3.87–5.11)
RDW: 12.2 % (ref 11.5–15.5)
WBC: 6 10*3/uL (ref 4.0–10.5)

## 2016-02-13 LAB — HEPATIC FUNCTION PANEL
ALBUMIN: 3.6 g/dL (ref 3.5–5.0)
ALT: 13 U/L — AB (ref 14–54)
AST: 17 U/L (ref 15–41)
Alkaline Phosphatase: 48 U/L (ref 38–126)
Bilirubin, Direct: 0.1 mg/dL (ref 0.1–0.5)
Indirect Bilirubin: 0.3 mg/dL (ref 0.3–0.9)
Total Bilirubin: 0.4 mg/dL (ref 0.3–1.2)
Total Protein: 6.4 g/dL — ABNORMAL LOW (ref 6.5–8.1)

## 2016-02-13 LAB — URINALYSIS, ROUTINE W REFLEX MICROSCOPIC
Bilirubin Urine: NEGATIVE
Ketones, ur: NEGATIVE mg/dL
Leukocytes, UA: NEGATIVE
Nitrite: NEGATIVE
PH: 6.5 (ref 5.0–8.0)
Protein, ur: NEGATIVE mg/dL
SPECIFIC GRAVITY, URINE: 1.043 — AB (ref 1.005–1.030)

## 2016-02-13 LAB — I-STAT VENOUS BLOOD GAS, ED
Acid-base deficit: 1 mmol/L (ref 0.0–2.0)
Bicarbonate: 26 mmol/L (ref 20.0–28.0)
O2 Saturation: 41 %
PCO2 VEN: 48.9 mmHg (ref 44.0–60.0)
PH VEN: 7.333 (ref 7.250–7.430)
TCO2: 27 mmol/L (ref 0–100)
pO2, Ven: 25 mmHg — CL (ref 32.0–45.0)

## 2016-02-13 LAB — WET PREP, GENITAL
SPERM: NONE SEEN
Trich, Wet Prep: NONE SEEN
YEAST WET PREP: NONE SEEN

## 2016-02-13 LAB — I-STAT CHEM 8, ED
BUN: 7 mg/dL (ref 6–20)
Calcium, Ion: 1.1 mmol/L — ABNORMAL LOW (ref 1.15–1.40)
Chloride: 100 mmol/L — ABNORMAL LOW (ref 101–111)
Creatinine, Ser: 0.5 mg/dL (ref 0.44–1.00)
Glucose, Bld: 393 mg/dL — ABNORMAL HIGH (ref 65–99)
HCT: 42 % (ref 36.0–46.0)
Hemoglobin: 14.3 g/dL (ref 12.0–15.0)
POTASSIUM: 4 mmol/L (ref 3.5–5.1)
Sodium: 134 mmol/L — ABNORMAL LOW (ref 135–145)
TCO2: 26 mmol/L (ref 0–100)

## 2016-02-13 LAB — LACTIC ACID, PLASMA
LACTIC ACID, VENOUS: 1.6 mmol/L (ref 0.5–1.9)
Lactic Acid, Venous: 1.5 mmol/L (ref 0.5–1.9)

## 2016-02-13 LAB — BASIC METABOLIC PANEL
Anion gap: 11 (ref 5–15)
BUN: 8 mg/dL (ref 6–20)
CHLORIDE: 101 mmol/L (ref 101–111)
CO2: 20 mmol/L — AB (ref 22–32)
CREATININE: 0.72 mg/dL (ref 0.44–1.00)
Calcium: 9.5 mg/dL (ref 8.9–10.3)
GFR calc Af Amer: >60 mL/min (ref >60)
GFR calc non Af Amer: >60 mL/min (ref >60)
Glucose, Bld: 488 mg/dL — ABNORMAL HIGH (ref 65–99)
Potassium: 4.4 mmol/L (ref 3.5–5.1)
Sodium: 132 mmol/L — ABNORMAL LOW (ref 135–145)

## 2016-02-13 LAB — CBG MONITORING, ED
GLUCOSE-CAPILLARY: 511 mg/dL — AB (ref 65–99)
GLUCOSE-CAPILLARY: 392 mg/dL — AB (ref 65–99)
Glucose-Capillary: 445 mg/dL — ABNORMAL HIGH (ref 65–99)
Glucose-Capillary: 246 mg/dL — ABNORMAL HIGH (ref 65–99)
Glucose-Capillary: 194 mg/dL — ABNORMAL HIGH (ref 65–99)

## 2016-02-13 LAB — I-STAT BETA HCG BLOOD, ED (MC, WL, AP ONLY): I-stat hCG, quantitative: <5 m[IU]/mL (ref <5)

## 2016-02-13 LAB — URINE MICROSCOPIC-ADD ON

## 2016-02-13 LAB — BETA-HYDROXYBUTYRIC ACID: BETA-HYDROXYBUTYRIC ACID: 0.15 mmol/L (ref 0.05–0.27)

## 2016-02-13 MED ORDER — IOPAMIDOL (ISOVUE-300) INJECTION 61%
INTRAVENOUS | Status: AC
  Filled 2016-02-13: qty 30

## 2016-02-13 MED ORDER — METRONIDAZOLE 500 MG PO TABS: 500.0000 mg | ORAL_TABLET | Freq: Two times a day (BID) | ORAL | 0 refills | Status: DC

## 2016-02-13 MED ORDER — FENTANYL CITRATE (PF) 100 MCG/2ML IJ SOLN
100.0000 ug | Freq: Once | INTRAMUSCULAR | Status: AC
  Administered 2016-02-13: 100 ug via INTRAVENOUS
  Filled 2016-02-13: qty 2

## 2016-02-13 MED ORDER — SODIUM CHLORIDE 0.9 % IV BOLUS (SEPSIS)
1000.0000 mL | Freq: Once | INTRAVENOUS | Status: AC
  Administered 2016-02-13: 1000 mL via INTRAVENOUS

## 2016-02-13 MED ORDER — INSULIN REGULAR HUMAN 100 UNIT/ML IJ SOLN
INTRAMUSCULAR | Status: DC
  Administered 2016-02-13: 3.3 [IU]/h via INTRAVENOUS
  Filled 2016-02-13: qty 2.5

## 2016-02-13 MED ORDER — INSULIN REGULAR BOLUS VIA INFUSION
0.0000 [IU] | Freq: Three times a day (TID) | INTRAVENOUS | Status: DC | PRN
  Filled 2016-02-13: qty 10

## 2016-02-13 MED ORDER — ONDANSETRON HCL 4 MG/2ML IJ SOLN
4.0000 mg | Freq: Once | INTRAMUSCULAR | Status: AC
  Administered 2016-02-13: 4 mg via INTRAVENOUS
  Filled 2016-02-13: qty 2

## 2016-02-13 MED ORDER — INSULIN REGULAR BOLUS VIA INFUSION
0.0000 [IU] | Freq: Three times a day (TID) | INTRAVENOUS | Status: DC
  Filled 2016-02-13: qty 10

## 2016-02-13 MED ORDER — SODIUM CHLORIDE 0.9 % IV SOLN: INTRAVENOUS | Status: DC

## 2016-02-13 MED ORDER — SODIUM CHLORIDE 0.9 % IV SOLN
INTRAVENOUS | Status: DC | PRN
  Filled 2016-02-13: qty 2.5

## 2016-02-13 MED ORDER — DEXTROSE 50 % IV SOLN: 25.0000 mL | INTRAVENOUS | Status: DC | PRN

## 2016-02-13 MED ORDER — IOPAMIDOL (ISOVUE-300) INJECTION 61%
INTRAVENOUS | Status: AC
Start: 2016-02-13 — End: 2016-02-13
  Administered 2016-02-13: 100 mL
  Filled 2016-02-13: qty 100

## 2016-02-13 MED ORDER — DEXTROSE-NACL 5-0.45 % IV SOLN
INTRAVENOUS | Status: DC
  Administered 2016-02-13: 18:00:00 via INTRAVENOUS

## 2016-02-13 NOTE — ED Notes (Signed)
Dr. Freda Munro notified of abnormal VBG results

## 2016-02-13 NOTE — ED Notes (Signed)
Pt transported to CT ?

## 2016-02-13 NOTE — ED Notes (Signed)
cbg was 392

## 2016-02-13 NOTE — ED Provider Notes (Signed)
[facility] DEPT Provider Note  CSN: [id number] Arrival Date & Time: 02/13/16 @ 51  History    Chief Complaint Chief Complaint  Patient presents with  . Vaginal Bleeding    NOT PREGNANT    HPI Ashley Freeman is a 25 y.o. female.  Patient presents to the ED via private vehicle for assessment of vaginal bleeding and abdominal pain. Duration has been since this AM around 3 AM. Quality described as vaginal spotting that developed into clots this AM, has only gone through 2 pads since last night. Modifying factors were worse w/ palpation and movement and better w/ rest. Abdominal pain is endorsed in the umbilical region and does not radiate.  Context of last initial period in early August and per the patient she has not had period for 2 months. Patient had last unprotected sex 2 weeks prior. Has been treated for sexual transmitted infection in the past however has received full courses of the appropriate medications. Patient endorses she has insulin-dependent diabetes and takes NovoLog sliding scale, which she is unable to recall the exact dosage due to recent change in dose correction factor and 20 units of Lantus twice a day. When asked how traumatized patient has had DKA patient states she is unable to recall the exact number as "I cannot even begin to count the number of times."  Associated symptoms of chest pain back pain fevers chills near-syncope syncope abnormal sensation or weakness headache neck pain vaginal discharge and urinary symptoms are absent.   Patient endorses that she has had previous miscarriage in 2010 and states this was due to infection that she incurred after stepping on nail. Patient dorsal is that she has not had any recent positive pregnancy test however has not taken any pregnancy tests recently.  Past Medical & Surgical History    Past Medical History:  Diagnosis Date  . Diabetes mellitus 2001   Diagnosed at age 66 ; Type I  . DKA (diabetic  ketoacidoses) ([place]) 08/19/2013  . Gonorrhea 08/2011   Treated in 09/2011   Patient Active Problem List   Diagnosis Date Noted  . Diabetic neuropathy ([place]) 01/16/2016  . DKA, type 1 ([facility]) 11/24/2015  . BV (bacterial vaginosis) 11/24/2015  . Chest pain 11/24/2015  . AKI (acute kidney injury) ([place]) 07/26/2014  . Infection of urinary tract 04/08/2014  . PID (acute pelvic inflammatory disease) 04/06/2014  . Diabetic ketoacidosis without coma associated with type 1 diabetes mellitus ([place]) 09/19/2013  . Infected cyst of Bartholin's gland duct 09/19/2013  . Bartholin's gland abscess 09/19/2013  . Sepsis ([place]) 09/19/2013  . Abscess, gluteal, right 08/24/2013  . Health care maintenance 08/24/2013  . Hypophosphatemia 08/18/2013  . Hypomagnesemia 08/18/2013  . Leukocytosis, unspecified 08/17/2013  . Fever, unspecified 08/17/2013  . H/O medication noncompliance 08/12/2013  . Hyponatremia 08/04/2012  . Anemia 02/19/2012  . Hyperglycemia 11/06/2011  . Sinus tachycardia ([place]) 10/17/2011  . Diabetes mellitus type 1, uncontrolled, with complications ([place]) 96/22/2979   Past Surgical History:  Procedure Laterality Date  . INCISION AND DRAINAGE PERIRECTAL ABSCESS Right 08/18/2013   Procedure: IRRIGATION AND DEBRIDEMENT GLUTEAL ABSCESS;  Surgeon: Ralene Ok, MD;  Location: [facility];  Service: General;  Laterality: Right;  . INCISION AND DRAINAGE PERIRECTAL ABSCESS Right 09/19/2013   Procedure: IRRIGATION AND DEBRIDEMENT RIGHT GLUTEAL AND LABIAL ABSCESSES;  Surgeon: Ralene Ok, MD;  Location: [facility];  Service: General;  Laterality: Right;  . INCISION AND DRAINAGE PERIRECTAL ABSCESS Right 09/24/2013   Procedure: IRRIGATION AND DEBRIDEMENT PERIRECTAL ABSCESS;  Surgeon: Gwenyth Ober, MD;  Location: [facility];  Service: General;  Laterality: Right;  . NO PAST SURGERIES      Family & Social History    Family History  Problem Relation Age of Onset  . Asthma Mother   . Gout Father   . Diabetes  Paternal Grandmother   . Anesthesia problems Neg Hx    Social History  Substance Use Topics  . Smoking status: Never Smoker  . Smokeless tobacco: Never Used  . Alcohol use No     Comment: occ    Home Medications    Prior to Admission medications   Medication Sig Start Date End Date Taking? Authorizing Provider  Blood Glucose Monitoring Suppl (TRUE METRIX METER) DEVI 1 each by Does not apply route 3 (three) times daily before meals. 01/16/16   Arnoldo Morale, MD  gabapentin (NEURONTIN) 300 MG capsule Take 1 capsule (300 mg total) by mouth 2 (two) times daily. 01/16/16   Arnoldo Morale, MD  glucose blood (TRUE METRIX BLOOD GLUCOSE TEST) test strip Use 3 times daily before meals 01/16/16   Arnoldo Morale, MD  ibuprofen (ADVIL,MOTRIN) 400 MG tablet Take 1 tablet (400 mg total) by mouth every 6 (six) hours as needed. 12/23/15   Tatyana Kirichenko, PA-C  insulin aspart (NOVOLOG FLEXPEN) 100 UNIT/ML FlexPen Inject 0-12 Units into the skin 3 (three) times daily with meals. As per sliding scale 01/16/16   Arnoldo Morale, MD  Insulin Glargine (LANTUS) 100 UNIT/ML Solostar Pen Inject 20 Units into the skin at bedtime. 01/16/16   Arnoldo Morale, MD  metroNIDAZOLE (FLAGYL) 500 MG tablet Take 1 tablet (500 mg total) by mouth 2 (two) times daily. Do not take with any alcohol ever. 02/13/16   Voncille Lo, MD  traMADol (ULTRAM) 50 MG tablet Take 1 tablet (50 mg total) by mouth every 6 (six) hours as needed. Patient not taking: Reported on 01/16/2016 12/23/15   Jeannett Senior, PA-C  TRUEPLUS LANCETS 28G MISC 1 each by Does not apply route 3 (three) times daily before meals. 01/16/16   Arnoldo Morale, MD    Allergies    Penicillins  I reviewed & agree with nursing's documentation on the patient's past medical, surgical, social & family histories as well as their allergies.  Review of Systems  Complete ROS obtained, and is negative except as stated in HPI.  Physical Exam  Updated Vital Signs BP 103/74   Pulse  90   Temp 97.7 F (36.5 C) (Oral)   Resp 12   LMP 12/03/2015   SpO2 100%  I have reviewed the triage vital signs and the nursing notes. Physical Exam CONST: Patient oriented to person, place and time, dehydrated, in mild to moderate distress.  EYES: PERRLA. EOMI. Conjunctiva w/o d/c. Lids AT w/o swelling.  ENMT: External Nares & Ears AT w/o swelling. Oropharynx patent. MM dry.  NECK: ROM full w/o rigidity. Trachea midline. JVD absent.  CVS: +S1/S2 w/o obvious murmur. Lower extremities w/o pitting edema.  RESP: Respiratory effort unlabored w/o retractions & accessory muscle use. BS clear bilaterally.  GI: Soft & ND. +BS x 4. TTP present per umbilicus. Hernia absent. Guarding present & Rebound absent. Pelvic & GU Exam: (Nursing supervision present) Labia & External Vagina w/o lesion or rash. Vaginal vault w/ blood & clots. POC absent. Discharge bloody. Cervix appears normal. Os closed. CMT absent. Adnexa w/o TTP. Masses absent bilaterally.   BACK: CVA TTP absent bilaterally.  SKIN: Skin warm & dry. Turgor good. No rash.  PSYCH: Alert. Oriented. Affect and mood appropriate.  NEURO: CN II-XII grossly intact. Motor exam symmetric w/ upper & lower extremities 5/5 bilaterally. Sensation grossly intact.  MSK: Joints located & stable, w/o obvious dislocation & obvious deformity or crepitus absent w/ Cap refill < 2 sec. Peripheral pulses 2+ & equal in all extremities.   ED Treatments & Results   Labs (only abnormal results are displayed) Labs Reviewed  WET PREP, GENITAL - Abnormal; Notable for the following:       Result Value   Clue Cells Wet Prep HPF POC PRESENT (*)    WBC, Wet Prep HPF POC MODERATE (*)    All other components within normal limits  BASIC METABOLIC PANEL - Abnormal; Notable for the following:    Sodium 132 (*)    CO2 20 (*)    Glucose, Bld 488 (*)    All other components within normal limits  CBC - Abnormal; Notable for the following:    RBC 5.20 (*)    All other  components within normal limits  URINALYSIS, ROUTINE W REFLEX MICROSCOPIC (NOT AT [facility]) - Abnormal; Notable for the following:    Specific Gravity, Urine 1.043 (*)    Glucose, UA >1000 (*)    Hgb urine dipstick LARGE (*)    All other components within normal limits  HEPATIC FUNCTION PANEL - Abnormal; Notable for the following:    Total Protein 6.4 (*)    ALT 13 (*)    All other components within normal limits  URINE MICROSCOPIC-ADD ON - Abnormal; Notable for the following:    Squamous Epithelial / LPF 6-30 (*)    Bacteria, UA FEW (*)    All other components within normal limits  CBG MONITORING, ED - Abnormal; Notable for the following:    Glucose-Capillary 511 (*)    All other components within normal limits  CBG MONITORING, ED - Abnormal; Notable for the following:    Glucose-Capillary 445 (*)    All other components within normal limits  I-STAT CHEM 8, ED - Abnormal; Notable for the following:    Sodium 134 (*)    Chloride 100 (*)    Glucose, Bld 393 (*)    Calcium, Ion 1.10 (*)    All other components within normal limits  CBG MONITORING, ED - Abnormal; Notable for the following:    Glucose-Capillary 392 (*)    All other components within normal limits  I-STAT VENOUS BLOOD GAS, ED - Abnormal; Notable for the following:    pO2, Ven 25.0 (*)    All other components within normal limits  CBG MONITORING, ED - Abnormal; Notable for the following:    Glucose-Capillary 246 (*)    All other components within normal limits  CBG MONITORING, ED - Abnormal; Notable for the following:    Glucose-Capillary 194 (*)    All other components within normal limits  URINE CULTURE  LACTIC ACID, PLASMA  BETA-HYDROXYBUTYRIC ACID  LACTIC ACID, PLASMA  BLOOD GAS, VENOUS  I-STAT BETA HCG BLOOD, ED ([facility], [facility], [facility] ONLY)  GC/CHLAMYDIA PROBE AMP ([place]) NOT AT [facility]    EKG    EKG Interpretation  Date/Time:    Ventricular Rate:    PR Interval:    QRS Duration:   QT Interval:    QTC  Calculation:   R Axis:     Text Interpretation:         Radiology Ct Abdomen Pelvis W Contrast  Result Date: 02/13/2016 CLINICAL DATA:  Acute onset of  generalized abdominal pain. Initial encounter. EXAM: CT ABDOMEN AND PELVIS WITH CONTRAST TECHNIQUE: Multidetector CT imaging of the abdomen and pelvis was performed using the standard protocol following bolus administration of intravenous contrast. CONTRAST:  154mL ISOVUE-300 IOPAMIDOL (ISOVUE-300) INJECTION 61% COMPARISON:  CT of the abdomen and pelvis from 04/06/2014, and right upper quadrant ultrasound performed 01/03/2015 FINDINGS: Lower chest: The visualized lung bases are grossly clear. The visualized portions of the mediastinum are unremarkable. Hepatobiliary: The liver is unremarkable in appearance. The gallbladder is unremarkable in appearance. The common bile duct remains normal in caliber. Pancreas: The pancreas is within normal limits. Spleen: The spleen is unremarkable in appearance. Adrenals/Urinary Tract: The adrenal glands are unremarkable in appearance. The kidneys are within normal limits. There is no evidence of hydronephrosis. No renal or ureteral stones are identified. No perinephric stranding is seen. Stomach/Bowel: The stomach is unremarkable in appearance. The small bowel is within normal limits. The appendix is difficult to fully characterize, but there is no evidence for appendicitis. The colon is unremarkable in appearance. Vascular/Lymphatic: The abdominal aorta is unremarkable in appearance. The inferior vena cava is grossly unremarkable. No retroperitoneal lymphadenopathy is seen. No pelvic sidewall lymphadenopathy is identified. Reproductive: The bladder is mildly distended and within normal limits. The uterus is grossly unremarkable in appearance. The ovaries are relatively symmetric. No suspicious adnexal masses are seen. Other: No additional soft tissue abnormalities are seen. Musculoskeletal: No acute osseous abnormalities  are identified. The visualized musculature is unremarkable in appearance. IMPRESSION: Unremarkable contrast-enhanced CT of the abdomen and pelvis. Appendix difficult to fully characterize, but no evidence of appendicitis. Electronically Signed   By: Garald Balding M.D.   On: 02/13/2016 19:33    Pertinent labs & imaging results that were available during my care of the patient were independently visualized by me and considered in my medical decision making, please see chart for details. Formal interpretation provided by Radiology.  Procedures (including critical care time) Procedures  Procedure note: Ultrasound Guided Peripheral IV Ultrasound guided peripheral 1.88 inch angiocath IV placement performed by me.   Indications: Nursing unable to place IV.   Details: The antecubital fossa and upper arm were evaluated with a multifrequency linear probe. Patent brachial veins were noted.   1 attempt was made to cannulate a vein under realtime US guidance with successful cannulation of the vein and catheter placement. There is return of non-pulsatile dark red blood. The patient tolerated the procedure well without complications.   Images archived electronically. CPT codes: [phone] and [phone]  Medications Ordered in ED Medications  iopamidol (ISOVUE-300) 61 % injection (not administered)  dextrose 5 %-0.45 % sodium chloride infusion ( Intravenous New Bag/Given 02/13/16 1822)  insulin regular bolus via infusion 0-10 Units (not administered)  insulin regular (NOVOLIN R,HUMULIN R) 250 Units in sodium chloride 0.9 % 250 mL (1 Units/mL) infusion (1.9 Units/hr Intravenous Rate/Dose Change 02/13/16 1822)  dextrose 50 % solution 25 mL (not administered)  0.9 %  sodium chloride infusion (not administered)  fentaNYL (SUBLIMAZE) injection 100 mcg (100 mcg Intravenous Given 02/13/16 1505)  ondansetron (ZOFRAN) injection 4 mg (4 mg Intravenous Given 02/13/16 1505)  sodium chloride 0.9 % bolus 1,000 mL (0 mLs  Intravenous Stopped 02/13/16 1820)  iopamidol (ISOVUE-300) 61 % injection (100 mLs  Contrast Given 02/13/16 1846)    Initial Impression & Plan / ED Course & Results / Final Disposition   Initial Impression & Plan Due to patient's endorsement's consideration for pregnancy had this time therefore beta hCG serum sent off  and upon review I appreciate patient is not pregnant at this time. Given this do not have concern for ectopic pregnancy however consideration still includes other bleeding diathesis, dysfunctional uterine bleeding, anovulatory cycle and adenomyosis.  Due to patient's abdominal pain and physical exam findings patient was given intravenous fentanyl and intravenous Zofran for symptoms. Patient versus moderate symptomatic relief however still does have pain therefore we will obtain CT abdomen pelvis with contrast to further evaluate for intra-abdominal process and I considered appendicitis or perforated abdominal viscus. Upon review of CTA and pelvis with IV and by mouth contrast appreciate no acute intraabdominal findings.  Due to patient's significantly elevated blood glucose at 992 metabolic labs were obtained and concern at this time given patient's CO2 of 20 the patient is presenting early prior to the onset of DKA. I obtained venous blood gas and appreciate patient not severely acidotic.  Pelvic exam obtained and I sent wet prep and GC chlamydia and did not appreciate any concerns of cervical motion tenderness or focal masses. Wet prep consistent with clue cells without trichomonads. GC chlamydia has not resulted at this time. Patient will receive treatment for one week for bacterial vaginosis with Flagyl. Medication precautions and side effects discussed with patient and she states understanding that she is not to have with alcohol.  Patient's hematologic labs reveal no concerning coagulopathy and thrombocytopenia or degree of anemia.  ED Course & Results Patient's pain and nausea  markedly improved following intravenous antiemetics and pain control. Per review patient's laboratory work clinical picture and the patient being able to ambulate without CP or near syncopal symptoms do not have concern for hemodynamically significant bleed at this time.  Final Disposition Reassessment of the patient reveals patient still with significantly elevated BG however patient notified myself & the ED staff, along w/ the attending physician, that they have decided to leave without further evaluation or treatment of their hyperglycemia.  I therefore immediately went to bedside & attempted to understand the patient's concerns & reasoning for their desire to leave at this time. 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  All resulted Lab & Imaging studies at this time were reviewed with the patient & I took time to explain the potential implications. Discussion was free of medical terminology & was appropriate for the patient's level of medical exposure & education. I explicitly stated to the patient that I am concerned they may be actively be experiencing a life-threatening surgical or medical emergency now, including but not limited to DKA and other diabetic complications.  We discussed the nature & purpose, risks & benefits, as well as, the alternatives of staying for treatment of hyperglycemia and observation of vaginal bleeding. I also explicitly stated that refusal of this could lead to, but was not limited to, death, permanent disability, severe uncontrollable pain or lifelong impairment.  Prior to refusing, I determined the patient has the capacity to make their decision & understood the consequences of that decision, & they were able to state back to me what we discussed. Time was given to the patient to allow the opportunity to ask questions & consider their options & after our discussion. The patient states they have no questions or concerns regarding our  discussion.  As the patient continues to refuse the offerred treatment & evaluation, I asked them to emergently & immediately return to the ED as soon as possible to complete their workup. I stated they would be welcomed back should  they return or change their mind at [facility]. They also stated understanding of this.  Prior to their leaving the ED, both myself & then again, the nursing staff took every reasonable opportunity to attempt to treat them to the best of our ability during the time & methods they allowed.  Prior to leaving, I provided the patient with a prescription of twice a day Flagyl for one week for bacterial vaginosis.. They were agreeable to this. They did take their discharge paperwork.  Final Clinical Impression & ED Diagnoses   1. Vaginal bleeding   2. Abnormal uterine bleeding   3. Hyperglycemia   4. Umbilical pain   5. Bacterial vaginosis    Patient care discussed with the attending physician, Dr. Laverta Baltimore, who oversaw their evaluation & treatment & voiced agreement.  Note: This document was prepared using Dragon voice recognition software and may include unintentional dictation errors.  House Officer: Voncille Lo, MD, Emergency Medicine Resident.   Voncille Lo, MD 02/13/16 2016    Margette Fast, MD 02/14/16 [phone]

## 2016-02-13 NOTE — ED Notes (Signed)
Failed IV attempt x2 RN

## 2016-02-13 NOTE — ED Notes (Signed)
Pt advising she does not want admitted tonight but will come back tomorrow after work- Long MD notified of same

## 2016-02-13 NOTE — ED Notes (Signed)
cbg was 445

## 2016-02-13 NOTE — ED Triage Notes (Signed)
Pt states LMP was in August and she missed September .  Pt states vaginal bleeding when she wipes and complains of lower abdominal pain and lower back pain.  Pt appears uncomfortable. IDDM.

## 2016-02-14 ENCOUNTER — Encounter (HOSPITAL_COMMUNITY): Payer: Self-pay

## 2016-02-14 ENCOUNTER — Emergency Department (HOSPITAL_COMMUNITY)
Admission: EM | Admit: 2016-02-14 | Discharge: 2016-02-14 | Disposition: A | Discharge status: Home / Self Care | Payer: Medicaid Other | Attending: Emergency Medicine | Admitting: Emergency Medicine

## 2016-02-14 DIAGNOSIS — R10817 Generalized abdominal tenderness: Secondary | ICD-10-CM | POA: Insufficient documentation

## 2016-02-14 DIAGNOSIS — R739 Hyperglycemia, unspecified: Secondary | ICD-10-CM

## 2016-02-14 DIAGNOSIS — E1010 Type 1 diabetes mellitus with ketoacidosis without coma: Secondary | ICD-10-CM | POA: Insufficient documentation

## 2016-02-14 DIAGNOSIS — E1040 Type 1 diabetes mellitus with diabetic neuropathy, unspecified: Secondary | ICD-10-CM | POA: Insufficient documentation

## 2016-02-14 DIAGNOSIS — E1065 Type 1 diabetes mellitus with hyperglycemia: Secondary | ICD-10-CM | POA: Insufficient documentation

## 2016-02-14 LAB — CBC
HEMATOCRIT: 39.3 % (ref 36.0–46.0)
Hemoglobin: 13.2 g/dL (ref 12.0–15.0)
MCH: 27.1 pg (ref 26.0–34.0)
MCHC: 33.6 g/dL (ref 30.0–36.0)
MCV: 80.7 fL (ref 78.0–100.0)
Platelets: 284 10*3/uL (ref 150–400)
RBC: 4.87 MIL/uL (ref 3.87–5.11)
RDW: 12.4 % (ref 11.5–15.5)
WBC: 4.5 10*3/uL (ref 4.0–10.5)

## 2016-02-14 LAB — BASIC METABOLIC PANEL
ANION GAP: 8 (ref 5–15)
BUN: <5 mg/dL — ABNORMAL LOW (ref 6–20)
CO2: 24 mmol/L (ref 22–32)
Calcium: 9 mg/dL (ref 8.9–10.3)
Chloride: 100 mmol/L — ABNORMAL LOW (ref 101–111)
Creatinine, Ser: 0.69 mg/dL (ref 0.44–1.00)
GFR calc Af Amer: >60 mL/min (ref >60)
GLUCOSE: 541 mg/dL — AB (ref 65–99)
POTASSIUM: 4.5 mmol/L (ref 3.5–5.1)
SODIUM: 132 mmol/L — AB (ref 135–145)

## 2016-02-14 LAB — BETA-HYDROXYBUTYRIC ACID: BETA-HYDROXYBUTYRIC ACID: 0.11 mmol/L (ref 0.05–0.27)

## 2016-02-14 LAB — GC/CHLAMYDIA PROBE AMP (~~LOC~~) NOT AT ARMC
CHLAMYDIA, DNA PROBE: NEGATIVE
Neisseria Gonorrhea: NEGATIVE

## 2016-02-14 LAB — CBG MONITORING, ED: Glucose-Capillary: 522 mg/dL (ref 65–99)

## 2016-02-14 MED ORDER — SODIUM CHLORIDE 0.9 % IV SOLN
INTRAVENOUS | Status: DC
  Filled 2016-02-14: qty 2.5

## 2016-02-14 MED ORDER — SODIUM CHLORIDE 0.9 % IV SOLN: INTRAVENOUS | Status: DC

## 2016-02-14 MED ORDER — POTASSIUM CHLORIDE CRYS ER 20 MEQ PO TBCR
40.0000 meq | EXTENDED_RELEASE_TABLET | Freq: Once | ORAL | Status: AC
  Administered 2016-02-14: 40 meq via ORAL
  Filled 2016-02-14: qty 2

## 2016-02-14 MED ORDER — INSULIN ASPART 100 UNIT/ML ~~LOC~~ SOLN
9.0000 [IU] | Freq: Once | SUBCUTANEOUS | Status: AC
  Administered 2016-02-14: 9 [IU] via SUBCUTANEOUS
  Filled 2016-02-14: qty 1

## 2016-02-14 MED ORDER — SODIUM CHLORIDE 0.9 % IV BOLUS (SEPSIS): 1000.0000 mL | Freq: Once | INTRAVENOUS | Status: DC

## 2016-02-14 NOTE — ED Triage Notes (Signed)
Patient here yesterday with vaginal bleeding and blood sugar running high on her meter. Complains of increased fatigue. States that she was running high yesterday. Alert and oriented, denies pain

## 2016-02-14 NOTE — Discharge Instructions (Signed)
Please make sure to monitor blood sugar closely. Follow-up with your primary care doctor to have that rechecked this week

## 2016-02-14 NOTE — ED Notes (Signed)
Attempted IV x2 without success  

## 2016-02-14 NOTE — ED Provider Notes (Signed)
[facility] DEPT Provider Note   CSN: [id number] Arrival date & time: 02/14/16  1517     History   Chief Complaint Chief Complaint  Patient presents with  . Fatigue/hyperglycemia    HPI Ashley Freeman is a 25 y.o. female.  HPI Pt presents to the ED for re evaluation of hyperglycemia.  She was seen in the ED yesterday for vaginal bleeding and abdominal pain.  Had a negative pregnancy test and CT scan. Noted to have high blood sugars with borderline acid base.  Admission suggested but pt wanted to go home and go to work.  She comes back today to get rechecked.  No nausea and vomiting today.  No fevers.  Sugars have still been running high.  Pt took her medicines this am, novolog  5 units and lantus 20 units.  No excess sugar intake.  She was able to eat normally today.  Her abdomen is still sore.  Cramping periumbilical.  No vomiting.  No diarrhea.  No urinary issues.  Irregular menses Past Medical History:  Diagnosis Date  . Diabetes mellitus 2001   Diagnosed at age 69 ; Type I  . DKA (diabetic ketoacidoses) ([place]) 08/19/2013  . Gonorrhea 08/2011   Treated in 09/2011    Patient Active Problem List   Diagnosis Date Noted  . Diabetic neuropathy ([place]) 01/16/2016  . DKA, type 1 ([place]) 11/24/2015  . BV (bacterial vaginosis) 11/24/2015  . Chest pain 11/24/2015  . AKI (acute kidney injury) ([place]) 07/26/2014  . Infection of urinary tract 04/08/2014  . PID (acute pelvic inflammatory disease) 04/06/2014  . Diabetic ketoacidosis without coma associated with type 1 diabetes mellitus ([place]) 09/19/2013  . Infected cyst of Bartholin's gland duct 09/19/2013  . Bartholin's gland abscess 09/19/2013  . Sepsis ([place]) 09/19/2013  . Abscess, gluteal, right 08/24/2013  . Health care maintenance 08/24/2013  . Hypophosphatemia 08/18/2013  . Hypomagnesemia 08/18/2013  . Leukocytosis, unspecified 08/17/2013  . Fever, unspecified 08/17/2013  . H/O medication noncompliance 08/12/2013  .  Hyponatremia 08/04/2012  . Anemia 02/19/2012  . Hyperglycemia 11/06/2011  . Sinus tachycardia ([place]) 10/17/2011  . Diabetes mellitus type 1, uncontrolled, with complications ([place]) 56/43/3295    Past Surgical History:  Procedure Laterality Date  . INCISION AND DRAINAGE PERIRECTAL ABSCESS Right 08/18/2013   Procedure: IRRIGATION AND DEBRIDEMENT GLUTEAL ABSCESS;  Surgeon: Ralene Ok, MD;  Location: [facility];  Service: General;  Laterality: Right;  . INCISION AND DRAINAGE PERIRECTAL ABSCESS Right 09/19/2013   Procedure: IRRIGATION AND DEBRIDEMENT RIGHT GLUTEAL AND LABIAL ABSCESSES;  Surgeon: Ralene Ok, MD;  Location: [facility];  Service: General;  Laterality: Right;  . INCISION AND DRAINAGE PERIRECTAL ABSCESS Right 09/24/2013   Procedure: IRRIGATION AND DEBRIDEMENT PERIRECTAL ABSCESS;  Surgeon: Gwenyth Ober, MD;  Location: [facility];  Service: General;  Laterality: Right;  . NO PAST SURGERIES      OB History    Gravida Para Term Preterm AB Living   1 0     1 0   SAB TAB Ectopic Multiple Live Births   1               Home Medications    Prior to Admission medications   Medication Sig Start Date End Date Taking? Authorizing Provider  gabapentin (NEURONTIN) 300 MG capsule Take 1 capsule (300 mg total) by mouth 2 (two) times daily. 01/16/16  Yes Arnoldo Morale, MD  insulin aspart (NOVOLOG FLEXPEN) 100 UNIT/ML FlexPen Inject 0-12 Units into the skin 3 (three) times daily  with meals. As per sliding scale 01/16/16  Yes Arnoldo Morale, MD  Insulin Glargine (LANTUS) 100 UNIT/ML Solostar Pen Inject 20 Units into the skin at bedtime. 01/16/16  Yes Arnoldo Morale, MD  glucose blood (TRUE METRIX BLOOD GLUCOSE TEST) test strip Use 3 times daily before meals 01/16/16   Arnoldo Morale, MD  ibuprofen (ADVIL,MOTRIN) 400 MG tablet Take 1 tablet (400 mg total) by mouth every 6 (six) hours as needed. Patient not taking: Reported on 02/14/2016 12/23/15   Tatyana Kirichenko, PA-C  metroNIDAZOLE (FLAGYL) 500 MG tablet Take  1 tablet (500 mg total) by mouth 2 (two) times daily. Do not take with any alcohol ever. 02/13/16   Voncille Lo, MD  traMADol (ULTRAM) 50 MG tablet Take 1 tablet (50 mg total) by mouth every 6 (six) hours as needed. Patient not taking: Reported on 02/14/2016 12/23/15   Jeannett Senior, PA-C    Family History Family History  Problem Relation Age of Onset  . Asthma Mother   . Gout Father   . Diabetes Paternal Grandmother   . Anesthesia problems Neg Hx     Social History Social History  Substance Use Topics  . Smoking status: Never Smoker  . Smokeless tobacco: Never Used  . Alcohol use No     Comment: occ     Allergies   Penicillins   Review of Systems Review of Systems  All other systems reviewed and are negative.    Physical Exam Updated Vital Signs BP 116/87 (BP Location: Right Arm)   Pulse 83   Temp 97.9 F (36.6 C)   Resp 20   LMP 12/03/2015   SpO2 100%   Physical Exam  Constitutional: She appears well-developed and well-nourished. No distress.  HENT:  Head: Normocephalic and atraumatic.  Right Ear: External ear normal.  Left Ear: External ear normal.  Eyes: Conjunctivae are normal. Right eye exhibits no discharge. Left eye exhibits no discharge. No scleral icterus.  Neck: Neck supple. No tracheal deviation present.  Cardiovascular: Normal rate, regular rhythm and intact distal pulses.   Pulmonary/Chest: Effort normal and breath sounds normal. No stridor. No respiratory distress. She has no wheezes. She has no rales.  Abdominal: Soft. Bowel sounds are normal. She exhibits no distension. There is tenderness (generalized). There is no rebound and no guarding.  Musculoskeletal: She exhibits no edema or tenderness.  Neurological: She is alert. She has normal strength. No cranial nerve deficit (no facial droop, extraocular movements intact, no slurred speech) or sensory deficit. She exhibits normal muscle tone. She displays no seizure activity. Coordination  normal.  Skin: Skin is warm and dry. No rash noted.  Psychiatric: She has a normal mood and affect.  Nursing note and vitals reviewed.    ED Treatments / Results  Labs (all labs ordered are listed, but only abnormal results are displayed) Labs Reviewed  BASIC METABOLIC PANEL - Abnormal; Notable for the following:       Result Value   Sodium 132 (*)    Chloride 100 (*)    Glucose, Bld 541 (*)    BUN <5 (*)    All other components within normal limits  CBG MONITORING, ED - Abnormal; Notable for the following:    Glucose-Capillary 522 (*)    All other components within normal limits  CBC  BETA-HYDROXYBUTYRIC ACID  BLOOD GAS, VENOUS  beta hcg test negative yesterday    Radiology Ct Abdomen Pelvis W Contrast  Result Date: 02/13/2016 CLINICAL DATA:  Acute onset of generalized abdominal  pain. Initial encounter. EXAM: CT ABDOMEN AND PELVIS WITH CONTRAST TECHNIQUE: Multidetector CT imaging of the abdomen and pelvis was performed using the standard protocol following bolus administration of intravenous contrast. CONTRAST:  136mL ISOVUE-300 IOPAMIDOL (ISOVUE-300) INJECTION 61% COMPARISON:  CT of the abdomen and pelvis from 04/06/2014, and right upper quadrant ultrasound performed 01/03/2015 FINDINGS: Lower chest: The visualized lung bases are grossly clear. The visualized portions of the mediastinum are unremarkable. Hepatobiliary: The liver is unremarkable in appearance. The gallbladder is unremarkable in appearance. The common bile duct remains normal in caliber. Pancreas: The pancreas is within normal limits. Spleen: The spleen is unremarkable in appearance. Adrenals/Urinary Tract: The adrenal glands are unremarkable in appearance. The kidneys are within normal limits. There is no evidence of hydronephrosis. No renal or ureteral stones are identified. No perinephric stranding is seen. Stomach/Bowel: The stomach is unremarkable in appearance. The small bowel is within normal limits. The  appendix is difficult to fully characterize, but there is no evidence for appendicitis. The colon is unremarkable in appearance. Vascular/Lymphatic: The abdominal aorta is unremarkable in appearance. The inferior vena cava is grossly unremarkable. No retroperitoneal lymphadenopathy is seen. No pelvic sidewall lymphadenopathy is identified. Reproductive: The bladder is mildly distended and within normal limits. The uterus is grossly unremarkable in appearance. The ovaries are relatively symmetric. No suspicious adnexal masses are seen. Other: No additional soft tissue abnormalities are seen. Musculoskeletal: No acute osseous abnormalities are identified. The visualized musculature is unremarkable in appearance. IMPRESSION: Unremarkable contrast-enhanced CT of the abdomen and pelvis. Appendix difficult to fully characterize, but no evidence of appendicitis. Electronically Signed   By: Garald Balding M.D.   On: 02/13/2016 19:33    Procedures Procedures (including critical care time)  Medications Ordered in ED Medications  sodium chloride 0.9 % bolus 1,000 mL (0 mLs Intravenous Hold 02/14/16 1901)    And  sodium chloride 0.9 % bolus 1,000 mL (0 mLs Intravenous Hold 02/14/16 1901)    And  0.9 %  sodium chloride infusion ( Intravenous Hold 02/14/16 1901)  insulin regular (NOVOLIN R,HUMULIN R) 250 Units in sodium chloride 0.9 % 250 mL (1 Units/mL) infusion (0 Units/hr Intravenous Hold 02/14/16 1901)  potassium chloride SA (K-DUR,KLOR-CON) CR tablet 40 mEq (40 mEq Oral Given 02/14/16 1910)  insulin aspart (novoLOG) injection 9 Units (9 Units Subcutaneous Given 02/14/16 1909)     Initial Impression / Assessment and Plan / ED Course  I have reviewed the triage vital signs and the nursing notes.  Pertinent labs & imaging results that were available during my care of the patient were reviewed by me and considered in my medical decision making (see chart for details).  Clinical Course  Comment By Time    CBG is elevated.  Normal bicarb.  VBG pending but doubt dka.  WIll give dose of potassium and start insulin for her blood sugar. Dorie Rank, MD 10/17 [phone]  I updated the patient on her status.  Her IV has not been started.  VBG was not drawn yet. Dorie Rank, MD 10/17 1827  Pt wants to go home and manage her sugar.  She has a history of high blood sugars.  Not currently in DKA.  She is tolerating PO and feels well.  Will give dose of subq insulin Dorie Rank, MD 10/17 [phone]  The patient states she is ready to go home right now. She does not want to wait until her blood sugars lower. The patient is not in DKA. She does not appear ill.  She did have an extensive workup yesterday for her abdominal pain.   Dorie Rank, MD 10/17 [place]     Final Clinical Impressions(s) / ED Diagnoses   Final diagnoses:  Hyperglycemia    New Prescriptions New Prescriptions   No medications on file     Dorie Rank, MD 02/14/16 1924

## 2016-02-14 NOTE — ED Notes (Signed)
Spoke to Dr. Ashok Cordia - holding all IV meds. Patient wishes not to have IV. Patient is difficult stick. Will hold all IV now and do 9 units SQ insulin.

## 2016-02-15 LAB — URINE CULTURE: Culture: >=100000 — AB

## 2016-02-16 ENCOUNTER — Telehealth (HOSPITAL_BASED_OUTPATIENT_CLINIC_OR_DEPARTMENT_OTHER): Payer: Self-pay | Admitting: Emergency Medicine

## 2016-02-16 NOTE — Telephone Encounter (Signed)
Post ED Visit - Positive Culture Follow-up  Culture report reviewed by antimicrobial stewardship pharmacist:  []  [name], Pharm.D. []  [name], Pharm.D., BCPS []  [name], Pharm.D. []  [name], Pharm.D., BCPS []  [place], Pharm.D., BCPS, AAHIVP []  [name], Pharm.D., BCPS, AAHIVP []  Milus Glazier, Pharm.D. []  Stephens November, [place].D. Gwenlyn Perking PharmD  Positive urine culture Treated with metronidazole,likely contaminant,no further patient follow-up is required at this time.  Hazle Nordmann 02/16/2016, 10:02 AM

## 2016-02-21 ENCOUNTER — Encounter (HOSPITAL_COMMUNITY): Payer: Self-pay | Admitting: Emergency Medicine

## 2016-02-21 ENCOUNTER — Emergency Department (HOSPITAL_COMMUNITY)
Admission: EM | Admit: 2016-02-21 | Discharge: 2016-02-21 | Disposition: A | Discharge status: Home / Self Care | Payer: Medicaid Other | Attending: Emergency Medicine | Admitting: Emergency Medicine

## 2016-02-21 DIAGNOSIS — E1040 Type 1 diabetes mellitus with diabetic neuropathy, unspecified: Secondary | ICD-10-CM | POA: Insufficient documentation

## 2016-02-21 DIAGNOSIS — J029 Acute pharyngitis, unspecified: Secondary | ICD-10-CM

## 2016-02-21 DIAGNOSIS — R739 Hyperglycemia, unspecified: Secondary | ICD-10-CM

## 2016-02-21 DIAGNOSIS — E1065 Type 1 diabetes mellitus with hyperglycemia: Secondary | ICD-10-CM | POA: Insufficient documentation

## 2016-02-21 LAB — CBC WITH DIFFERENTIAL/PLATELET
Basophils Absolute: 0 10*3/uL (ref 0.0–0.1)
Basophils Relative: 0 %
Eosinophils Absolute: 0.5 10*3/uL (ref 0.0–0.7)
Eosinophils Relative: 8 %
HEMATOCRIT: 40.4 % (ref 36.0–46.0)
HEMOGLOBIN: 14.2 g/dL (ref 12.0–15.0)
Lymphocytes Relative: 33 %
Lymphs Abs: 2.1 10*3/uL (ref 0.7–4.0)
MCH: 28.2 pg (ref 26.0–34.0)
MCHC: 35.1 g/dL (ref 30.0–36.0)
MCV: 80.3 fL (ref 78.0–100.0)
MONOS PCT: 7 %
Monocytes Absolute: 0.5 10*3/uL (ref 0.1–1.0)
NEUTROS ABS: 3.3 10*3/uL (ref 1.7–7.7)
NEUTROS PCT: 52 %
Platelets: 282 10*3/uL (ref 150–400)
RBC: 5.03 MIL/uL (ref 3.87–5.11)
RDW: 12.3 % (ref 11.5–15.5)
WBC: 6.3 10*3/uL (ref 4.0–10.5)

## 2016-02-21 LAB — COMPREHENSIVE METABOLIC PANEL
ALK PHOS: 60 U/L (ref 38–126)
ALT: 20 U/L (ref 14–54)
AST: 18 U/L (ref 15–41)
Albumin: 3.9 g/dL (ref 3.5–5.0)
Anion gap: 12 (ref 5–15)
BILIRUBIN TOTAL: 0.8 mg/dL (ref 0.3–1.2)
BUN: 6 mg/dL (ref 6–20)
CALCIUM: 9.7 mg/dL (ref 8.9–10.3)
CO2: 21 mmol/L — ABNORMAL LOW (ref 22–32)
Chloride: 91 mmol/L — ABNORMAL LOW (ref 101–111)
Creatinine, Ser: 0.94 mg/dL (ref 0.44–1.00)
Glucose, Bld: 921 mg/dL (ref 65–99)
POTASSIUM: 4.5 mmol/L (ref 3.5–5.1)
Sodium: 124 mmol/L — ABNORMAL LOW (ref 135–145)
TOTAL PROTEIN: 7.3 g/dL (ref 6.5–8.1)

## 2016-02-21 LAB — RAPID STREP SCREEN (MED CTR MEBANE ONLY): Streptococcus, Group A Screen (Direct): NEGATIVE

## 2016-02-21 LAB — URINE MICROSCOPIC-ADD ON

## 2016-02-21 LAB — CBG MONITORING, ED
GLUCOSE-CAPILLARY: 97 mg/dL (ref 65–99)
Glucose-Capillary: >600 mg/dL (ref 65–99)
Glucose-Capillary: >600 mg/dL (ref 65–99)
Glucose-Capillary: 137 mg/dL — ABNORMAL HIGH (ref 65–99)
Glucose-Capillary: 85 mg/dL (ref 65–99)
Glucose-Capillary: 175 mg/dL — ABNORMAL HIGH (ref 65–99)

## 2016-02-21 LAB — URINALYSIS, ROUTINE W REFLEX MICROSCOPIC
Bilirubin Urine: NEGATIVE
Hgb urine dipstick: NEGATIVE
KETONES UR: NEGATIVE mg/dL
LEUKOCYTES UA: NEGATIVE
NITRITE: NEGATIVE
PROTEIN: NEGATIVE mg/dL
Specific Gravity, Urine: 1.041 — ABNORMAL HIGH (ref 1.005–1.030)
pH: 6 (ref 5.0–8.0)

## 2016-02-21 LAB — I-STAT CG4 LACTIC ACID, ED
LACTIC ACID, VENOUS: 2.77 mmol/L — AB (ref 0.5–1.9)
LACTIC ACID, VENOUS: 2.43 mmol/L — AB (ref 0.5–1.9)

## 2016-02-21 LAB — I-STAT BETA HCG BLOOD, ED (MC, WL, AP ONLY)

## 2016-02-21 MED ORDER — INSULIN ASPART 100 UNIT/ML ~~LOC~~ SOLN
15.0000 [IU] | Freq: Once | SUBCUTANEOUS | Status: AC
  Administered 2016-02-21: 15 [IU] via SUBCUTANEOUS
  Filled 2016-02-21: qty 1

## 2016-02-21 MED ORDER — SODIUM CHLORIDE 0.9 % IV BOLUS (SEPSIS)
1000.0000 mL | Freq: Once | INTRAVENOUS | Status: AC
  Administered 2016-02-21: 1000 mL via INTRAVENOUS

## 2016-02-21 MED ORDER — INSULIN REGULAR HUMAN 100 UNIT/ML IJ SOLN
INTRAMUSCULAR | Status: DC
  Administered 2016-02-21: 5.4 [IU]/h via INTRAVENOUS
  Filled 2016-02-21: qty 2.5

## 2016-02-21 NOTE — ED Provider Notes (Signed)
The patient is a 25 year old female well-known to the emergency department for recurrent hyperglycemia. The patient readily states that she does not take her insulin as she should stating that sometime she does not take it, sometimes she takes a half dose, it does not appear that she truly understands exactly how to take her medication or at least doesn't care about how to take it. She presents with hyperglycemia and a sore throat. On exam she is not tachycardic and does not have any signs of erythema exudate asymmetry or hypertrophy of the throat. Abdomen is soft, she does appear dehydrated clinically with dry mucous members. I personally placed an IV in her left forearm on the volar surface, 22-gauge. She will get hydration, she will get antinausea medications as needed. She does not appear to have strep throat. She is not in DKA based on labs  Angiocath insertion Performed by: Johnna Acosta  Consent: Verbal consent obtained. Risks and benefits: risks, benefits and alternatives were discussed Time out: Immediately prior to procedure a "time out" was called to verify the correct patient, procedure, equipment, support staff and site/side marked as required.  Preparation: Patient was prepped and draped in the usual sterile fashion.  Vein Location: L forearm   Not Ultrasound Guided  Gauge: 22  Normal blood return and flush without difficulty Patient tolerance: Patient tolerated the procedure well with no immediate complications.     Medical screening examination/treatment/procedure(s) were conducted as a shared visit with non-physician practitioner(s) and myself.  I personally evaluated the patient during the encounter.  Clinical Impression:   Final diagnoses:  Viral pharyngitis  Hyperglycemia         Noemi Chapel, MD 02/21/16 2228

## 2016-02-21 NOTE — ED Notes (Signed)
cbg rechecked, CBG 97

## 2016-02-21 NOTE — ED Provider Notes (Signed)
[facility] DEPT Provider Note   CSN: [id number] Arrival date & time: 02/21/16  1215     History   Chief Complaint Chief Complaint  Patient presents with  . Sore Throat    HPI Ashley Freeman is a 25 y.o. female the past like a history of diabetes, noncompliance who presents the ED today complaining of sore throat. Patient states that her brother was recently diagnosed with strep and she is concerned she may have the same. She woke up yesterday morning with a sore throat and painful swallowing. No associated fevers, chills, cough, otalgia, vomiting. Pt is still able to tolerate PO.   Of note, pt states that she has not been checking her sugars at home. Last dose of insulin was yesterday.  HPI  Past Medical History:  Diagnosis Date  . Diabetes mellitus 2001   Diagnosed at age 64 ; Type I  . DKA (diabetic ketoacidoses) ([place]) 08/19/2013  . Gonorrhea 08/2011   Treated in 09/2011    Patient Active Problem List   Diagnosis Date Noted  . Diabetic neuropathy ([place]) 01/16/2016  . DKA, type 1 ([place]) 11/24/2015  . BV (bacterial vaginosis) 11/24/2015  . Chest pain 11/24/2015  . AKI (acute kidney injury) ([place]) 07/26/2014  . Infection of urinary tract 04/08/2014  . PID (acute pelvic inflammatory disease) 04/06/2014  . Diabetic ketoacidosis without coma associated with type 1 diabetes mellitus ([place]) 09/19/2013  . Infected cyst of Bartholin's gland duct 09/19/2013  . Bartholin's gland abscess 09/19/2013  . Sepsis ([place]) 09/19/2013  . Abscess, gluteal, right 08/24/2013  . Health care maintenance 08/24/2013  . Hypophosphatemia 08/18/2013  . Hypomagnesemia 08/18/2013  . Leukocytosis, unspecified 08/17/2013  . Fever, unspecified 08/17/2013  . H/O medication noncompliance 08/12/2013  . Hyponatremia 08/04/2012  . Anemia 02/19/2012  . Hyperglycemia 11/06/2011  . Sinus tachycardia ([place]) 10/17/2011  . Diabetes mellitus type 1, uncontrolled, with complications ([place]) 15/40/0867     Past Surgical History:  Procedure Laterality Date  . INCISION AND DRAINAGE PERIRECTAL ABSCESS Right 08/18/2013   Procedure: IRRIGATION AND DEBRIDEMENT GLUTEAL ABSCESS;  Surgeon: Ralene Ok, MD;  Location: [facility];  Service: General;  Laterality: Right;  . INCISION AND DRAINAGE PERIRECTAL ABSCESS Right 09/19/2013   Procedure: IRRIGATION AND DEBRIDEMENT RIGHT GLUTEAL AND LABIAL ABSCESSES;  Surgeon: Ralene Ok, MD;  Location: [facility];  Service: General;  Laterality: Right;  . INCISION AND DRAINAGE PERIRECTAL ABSCESS Right 09/24/2013   Procedure: IRRIGATION AND DEBRIDEMENT PERIRECTAL ABSCESS;  Surgeon: Gwenyth Ober, MD;  Location: [facility];  Service: General;  Laterality: Right;  . NO PAST SURGERIES      OB History    Gravida Para Term Preterm AB Living   1 0     1 0   SAB TAB Ectopic Multiple Live Births   1               Home Medications    Prior to Admission medications   Medication Sig Start Date End Date Taking? Authorizing Provider  gabapentin (NEURONTIN) 300 MG capsule Take 1 capsule (300 mg total) by mouth 2 (two) times daily. 01/16/16  Yes Arnoldo Morale, MD  glucose blood (TRUE METRIX BLOOD GLUCOSE TEST) test strip Use 3 times daily before meals 01/16/16  Yes Enobong Amao, MD  insulin aspart (NOVOLOG FLEXPEN) 100 UNIT/ML FlexPen Inject 0-12 Units into the skin 3 (three) times daily with meals. As per sliding scale 01/16/16  Yes Arnoldo Morale, MD  Insulin Glargine (LANTUS) 100 UNIT/ML Solostar Pen Inject 20  Units into the skin at bedtime. 01/16/16  Yes Arnoldo Morale, MD  ibuprofen (ADVIL,MOTRIN) 400 MG tablet Take 1 tablet (400 mg total) by mouth every 6 (six) hours as needed. Patient not taking: Reported on 02/21/2016 12/23/15   Tatyana Kirichenko, PA-C  metroNIDAZOLE (FLAGYL) 500 MG tablet Take 1 tablet (500 mg total) by mouth 2 (two) times daily. Do not take with any alcohol ever. Patient not taking: Reported on 02/21/2016 02/13/16   Voncille Lo, MD  traMADol (ULTRAM) 50 MG  tablet Take 1 tablet (50 mg total) by mouth every 6 (six) hours as needed. Patient not taking: Reported on 02/21/2016 12/23/15   Jeannett Senior, PA-C    Family History Family History  Problem Relation Age of Onset  . Asthma Mother   . Gout Father   . Diabetes Paternal Grandmother   . Anesthesia problems Neg Hx     Social History Social History  Substance Use Topics  . Smoking status: Never Smoker  . Smokeless tobacco: Never Used  . Alcohol use No     Comment: occ     Allergies   Penicillins   Review of Systems Review of Systems  All other systems reviewed and are negative.    Physical Exam Updated Vital Signs BP 111/84 (BP Location: Right Arm)   Pulse 120   Temp 97.8 F (36.6 C) (Oral)   Resp 18   LMP 12/03/2015   SpO2 98%   Physical Exam  Constitutional: She is oriented to person, place, and time. She appears well-developed and well-nourished. No distress.  HENT:  Head: Normocephalic and atraumatic.  Mouth/Throat: Oropharynx is clear and moist. No oropharyngeal exudate.  Eyes: Conjunctivae and EOM are normal. Pupils are equal, round, and reactive to light. Right eye exhibits no discharge. Left eye exhibits no discharge. No scleral icterus.  Neck: Neck supple.  Cardiovascular: Normal rate, regular rhythm, normal heart sounds and intact distal pulses.  Exam reveals no gallop and no friction rub.   No murmur heard. Pulmonary/Chest: Effort normal and breath sounds normal. No respiratory distress. She has no wheezes. She has no rales. She exhibits no tenderness.  Abdominal: Soft. She exhibits no distension. There is no tenderness. There is no guarding.  Musculoskeletal: Normal range of motion. She exhibits no edema.  Lymphadenopathy:    She has no cervical adenopathy.  Neurological: She is alert and oriented to person, place, and time.  Skin: Skin is warm and dry. No rash noted. She is not diaphoretic. No erythema. No pallor.  Psychiatric: She has a normal  mood and affect. Her behavior is normal.  Nursing note and vitals reviewed.    ED Treatments / Results  Labs (all labs ordered are listed, but only abnormal results are displayed) Labs Reviewed  COMPREHENSIVE METABOLIC PANEL - Abnormal; Notable for the following:       Result Value   Sodium 124 (*)    Chloride 91 (*)    CO2 21 (*)    Glucose, Bld 921 (*)    All other components within normal limits  URINALYSIS, ROUTINE W REFLEX MICROSCOPIC (NOT AT [facility]) - Abnormal; Notable for the following:    Specific Gravity, Urine 1.041 (*)    Glucose, UA >1000 (*)    All other components within normal limits  URINE MICROSCOPIC-ADD ON - Abnormal; Notable for the following:    Squamous Epithelial / LPF 6-30 (*)    Bacteria, UA FEW (*)    All other components within normal limits  CBG MONITORING,  ED - Abnormal; Notable for the following:    Glucose-Capillary >600 (*)    All other components within normal limits  I-STAT CG4 LACTIC ACID, ED - Abnormal; Notable for the following:    Lactic Acid, Venous 2.77 (*)    All other components within normal limits  CBG MONITORING, ED - Abnormal; Notable for the following:    Glucose-Capillary >600 (*)    All other components within normal limits  RAPID STREP SCREEN (NOT AT [facility])  CULTURE, GROUP A STREP ([place])  CBC WITH DIFFERENTIAL/PLATELET  I-STAT BETA HCG BLOOD, ED ([facility], [facility], [facility] ONLY)    EKG  EKG Interpretation None       Radiology No results found.  Procedures Procedures (including critical care time)  Medications Ordered in ED Medications  insulin regular (NOVOLIN R,HUMULIN R) 250 Units in sodium chloride 0.9 % 250 mL (1 Units/mL) infusion (not administered)  sodium chloride 0.9 % bolus 1,000 mL (1,000 mLs Intravenous New Bag/Given 02/21/16 1407)  insulin aspart (novoLOG) injection 15 Units (15 Units Subcutaneous Given 02/21/16 1328)     Initial Impression / Assessment and Plan / ED Course  I have reviewed the triage vital signs  and the nursing notes.  Pertinent labs & imaging results that were available during my care of the patient were reviewed by me and considered in my medical decision making (see chart for details).  Clinical Course    25 year old female noncompliant diabetic presents the ED complaining of sore throat onset yesterday. Patient is afebrile. There is nonerythematous, no tonsillar exudate. Rapid strep negative. Likely viral pharyngitis. On presentation to ED, patient's CBG was too high to read. Labs obtained which revealed blood glucose at 921. No sign of DKA. No anion gap or ketonuria. Patient initially given 15 units of insulin subcutaneous as IV access was difficult to obtain. However, as once IV was placed patient was placed on glucose stabilizing and given 2 boluses of fluid. Patient blood sugar dropped quickly to 137. Glucose stabilizer discontinued. Will monitor CBG for next 1-2 hours to ensure no hypoglycemia occurs then plan to d/c home. Pt signed out to [employer] PA-C at shift change.  Patient was discussed with and seen by Dr. Sabra Heck who agrees with the treatment plan.    Final Clinical Impressions(s) / ED Diagnoses   Final diagnoses:  Viral pharyngitis  Hyperglycemia    New Prescriptions New Prescriptions   No medications on file     [name] Levering, PA-C 02/21/16 1629    Noemi Chapel, MD 02/21/16 2228

## 2016-02-21 NOTE — ED Triage Notes (Signed)
Pt here for sore throat and body aches x 2 days; pt sts brother recently had strep

## 2016-02-21 NOTE — Discharge Instructions (Signed)
You do not have strep throat. You may take tylenol or ibuprofen as needed. Please take your diabetic medications as prescribed. Follow up with PCP.

## 2016-02-21 NOTE — ED Notes (Signed)
Patient transported back from CT 

## 2016-02-21 NOTE — ED Notes (Signed)
PA Gekas made aware of patient's heart rate of 110, PA advised this RN it is still okay to discharge patient at this time.

## 2016-02-21 NOTE — ED Notes (Signed)
CBG 85

## 2016-02-21 NOTE — ED Provider Notes (Signed)
Patient signed out to me by S Dowless PA-C. Patient presents with sore throat and hyperglycemia. Strep test negative. Glucose is 921 however anion gap is 12. CBC unremarkable. 2L IVF given with insulin. CBG checked and has decreased to 137. D/c pending stabilization of glucose.  4:15 PM: CBG 97.  5:00 PM: CBG 85. Patient feels shaky. Sandwich and OJ given.  6:00 PM: CBG 175. D/c with PCP follow up and return precautions.   Recardo Evangelist, PA-C 02/21/16 [place], DO 02/21/16 [phone]

## 2016-02-21 NOTE — ED Notes (Signed)
Patient reports feeling "shaky" patient given meal after verifying it was okay with MD.

## 2016-02-21 NOTE — ED Notes (Signed)
Pt's CBG is "HI:  Exceeds 600."  Informed Annabelle Harman, RN.

## 2016-02-21 NOTE — ED Notes (Signed)
CRITICAL VALUE ALERT  Critical value received:  Glucose  Date of notification:  10/24  Time of notification:  7183  Critical value read back:Yes.    Nurse who received alert:  Jake Bathe, RN  MD notified (1st page):  Sabra Heck  Time of first page:  1323    Responding MD: Sabra Heck  Time MD responded:  [id number]

## 2016-02-23 LAB — CULTURE, GROUP A STREP (THRC)

## 2016-03-20 ENCOUNTER — Ambulatory Visit: Payer: Self-pay | Attending: Family Medicine | Admitting: Family Medicine

## 2016-03-20 ENCOUNTER — Encounter: Payer: Self-pay | Admitting: Family Medicine

## 2016-03-20 VITALS — BP 121/80 | HR 116 | Temp 97.7°F | Ht 63.0 in | Wt 120.0 lb

## 2016-03-20 DIAGNOSIS — E1040 Type 1 diabetes mellitus with diabetic neuropathy, unspecified: Secondary | ICD-10-CM | POA: Insufficient documentation

## 2016-03-20 DIAGNOSIS — R634 Abnormal weight loss: Secondary | ICD-10-CM

## 2016-03-20 DIAGNOSIS — Z880 Allergy status to penicillin: Secondary | ICD-10-CM | POA: Insufficient documentation

## 2016-03-20 DIAGNOSIS — Z794 Long term (current) use of insulin: Secondary | ICD-10-CM | POA: Insufficient documentation

## 2016-03-20 DIAGNOSIS — E1065 Type 1 diabetes mellitus with hyperglycemia: Secondary | ICD-10-CM

## 2016-03-20 DIAGNOSIS — IMO0002 Reserved for concepts with insufficient information to code with codable children: Secondary | ICD-10-CM

## 2016-03-20 DIAGNOSIS — E108 Type 1 diabetes mellitus with unspecified complications: Secondary | ICD-10-CM

## 2016-03-20 DIAGNOSIS — Z6821 Body mass index (BMI) 21.0-21.9, adult: Secondary | ICD-10-CM | POA: Insufficient documentation

## 2016-03-20 LAB — GLUCOSE, POCT (MANUAL RESULT ENTRY): POC GLUCOSE: 476 mg/dL — AB (ref 70–99)

## 2016-03-20 MED ORDER — INSULIN ASPART 100 UNIT/ML ~~LOC~~ SOLN
10.0000 [IU] | Freq: Once | SUBCUTANEOUS | Status: AC
  Administered 2016-03-20: 10 [IU] via SUBCUTANEOUS

## 2016-03-20 MED ORDER — INSULIN GLARGINE 100 UNIT/ML SOLOSTAR PEN: 25.0000 [IU] | PEN_INJECTOR | Freq: Every day | SUBCUTANEOUS | 11 refills | Status: DC

## 2016-03-20 NOTE — Progress Notes (Signed)
Subjective:  Patient ID: Ashley Freeman, female    DOB: 1990-08-19  Age: 25 y.o. MRN: [id number]  CC: Diabetes   HPI ANESHIA JACQUET is a 25 year old female with a history of type 1 diabetes mellitus (A1c>14.0), diabetic neuropathy who presents today for follow-up of diabetes and her blood sugar is 476. She just had a meal 1 hour ago. She endorses compliance with 20 units of Lantus and NovoLog sliding scale; she took 6 units of NovoLog this afternoon and informs me that she ate a Taco salad. Fasting sugars have been in the upper 200s and she denies hypoglycemia. Denies abdominal pain, visual symptoms or urinary symptoms.  She was commenced on gabapentin at her last office visit for neuropathy but she complained she felt like a zombie on waking up early in the morning and so she discontinued it.  Also complains of inability to take a normal weight; she has steadily lost weight over the last 4 years but also endorses that her diabetes has been uncontrolled in that timeframe as well.    Past Medical History:  Diagnosis Date  . Diabetes mellitus 2001   Diagnosed at age 25 ; Type I  . DKA (diabetic ketoacidoses) ([place]) 08/19/2013  . Gonorrhea 08/2011   Treated in 09/2011    Past Surgical History:  Procedure Laterality Date  . INCISION AND DRAINAGE PERIRECTAL ABSCESS Right 08/18/2013   Procedure: IRRIGATION AND DEBRIDEMENT GLUTEAL ABSCESS;  Surgeon: Ralene Ok, MD;  Location: [facility];  Service: General;  Laterality: Right;  . INCISION AND DRAINAGE PERIRECTAL ABSCESS Right 09/19/2013   Procedure: IRRIGATION AND DEBRIDEMENT RIGHT GLUTEAL AND LABIAL ABSCESSES;  Surgeon: Ralene Ok, MD;  Location: [facility];  Service: General;  Laterality: Right;  . INCISION AND DRAINAGE PERIRECTAL ABSCESS Right 09/24/2013   Procedure: IRRIGATION AND DEBRIDEMENT PERIRECTAL ABSCESS;  Surgeon: Gwenyth Ober, MD;  Location: [facility];  Service: General;  Laterality: Right;  . NO PAST SURGERIES       Allergies  Allergen Reactions  . Penicillins Hives    Childhood allergic reaction Has patient had a PCN reaction causing immediate rash, facial/tongue/throat swelling, SOB or lightheadedness with hypotension: Yes Has patient had a PCN reaction causing severe rash involving mucus membranes or skin necrosis: No Has patient had a PCN reaction that required hospitalization: unknown Has patient had a PCN reaction occurring within the last 10 years: No If all of the above answers are "NO", then may proceed with Cephalosporin use.     Outpatient Medications Prior to Visit  Medication Sig Dispense Refill  . glucose blood (TRUE METRIX BLOOD GLUCOSE TEST) test strip Use 3 times daily before meals 100 each 12  . insulin aspart (NOVOLOG FLEXPEN) 100 UNIT/ML FlexPen Inject 0-12 Units into the skin 3 (three) times daily with meals. As per sliding scale 15 mL 11  . Insulin Glargine (LANTUS) 100 UNIT/ML Solostar Pen Inject 20 Units into the skin at bedtime. 15 mL 11  . gabapentin (NEURONTIN) 300 MG capsule Take 1 capsule (300 mg total) by mouth 2 (two) times daily. (Patient not taking: Reported on 03/20/2016) 60 capsule 3   Facility-Administered Medications Prior to Visit  Medication Dose Route Frequency Provider Last Rate Last Dose  . Insulin Pen Needle (NOVOFINE) 200 each  20 packet Subcutaneous PRN Allie Bossier, MD        ROS Review of Systems  Constitutional: Negative for activity change, appetite change and fatigue.  HENT: Negative for congestion, sinus pressure and  sore throat.   Eyes: Negative for visual disturbance.  Respiratory: Negative for cough, chest tightness, shortness of breath and wheezing.   Cardiovascular: Negative for chest pain and palpitations.  Gastrointestinal: Negative for abdominal distention, abdominal pain and constipation.  Endocrine: Negative for polydipsia.  Genitourinary: Negative for dysuria and frequency.  Musculoskeletal: Negative for arthralgias and back  pain.  Skin: Negative for rash.  Neurological: Negative for tremors, light-headedness and numbness.  Hematological: Does not bruise/bleed easily.  Psychiatric/Behavioral: Negative for agitation and behavioral problems.    Objective:  BP 121/80 (BP Location: Right Arm, Patient Position: Sitting, Cuff Size: Small)   Pulse (!) 116   Temp 97.7 F (36.5 C) (Oral)   Ht 5\' 3"  (1.6 m)   Wt 120 lb (54.4 kg)   LMP 03/19/2016 (Exact Date)   SpO2 99%   BMI 21.26 kg/m   BP/Weight 03/20/2016 02/21/2016 70/62/3762  Systolic BP 831 517 616  Diastolic BP 80 68 89  Wt. (Lbs) 120 - -  BMI 21.26 - -      Physical Exam  Constitutional: She is oriented to person, place, and time. She appears well-developed and well-nourished.  Cardiovascular: Normal rate, normal heart sounds and intact distal pulses.   No murmur heard. Pulmonary/Chest: Effort normal and breath sounds normal. She has no wheezes. She has no rales. She exhibits no tenderness.  Abdominal: Soft. Bowel sounds are normal. She exhibits no distension and no mass. There is no tenderness.  Musculoskeletal: Normal range of motion.  Neurological: She is alert and oriented to person, place, and time.  Skin: Skin is warm and dry.  Psychiatric: She has a normal mood and affect.    Lab Results  Component Value Date   HGBA1C >14.0 (H) 01/16/2016    Assessment & Plan:   1. Diabetes mellitus type 1, uncontrolled, with complications ([place]) Uncontrolled with A1c of greater than 14.0 NovoLog 10 units administered due to CBG of 476 (she is 1 hour postprandial) Increase Lantus from 20 units to 25 units at bedtime Review blood sugar log at next visit Keep blood sugar logs with fasting goals of 80-120 mg/dl, random of less than 180 and in the event of sugars less than 60 mg/dl or greater than 400 mg/dl please notify the clinic ASAP. It is recommended that you undergo annual eye exams and annual foot exams. Pneumovax is recommended every 5 years  before the age of 12 and once for a lifetime at or after the age of 66. - Glucose (CBG) - Lipid panel; Future - Microalbumin / creatinine urine ratio; Future - Insulin Glargine (LANTUS) 100 UNIT/ML Solostar Pen; Inject 25 Units into the skin at bedtime.  Dispense: 15 mL; Refill: 11  2. Loss of weight Could be due to catabolic effects of uncontrolled diabetes mellitus - TSH; Future  3. Diabetic neuropathy Improved Patient stopped gabapentin due to intolerance  Meds ordered this encounter  Medications  . Insulin Glargine (LANTUS) 100 UNIT/ML Solostar Pen    Sig: Inject 25 Units into the skin at bedtime.    Dispense:  15 mL    Refill:  11    Discontinue previous dose    Total time spent in encounter: 45 minutes and greater than half of the time spent monitoring patient after administration of insulin, discussing the diagnosis, lifestyle modifications and counseling about Diabetes. Follow-up: Return in about 1 month (around 04/19/2016) for Follow-up on diabetes mellitus.   Arnoldo Morale MD

## 2016-03-20 NOTE — Patient Instructions (Signed)
Diabetes Mellitus and Food It is important for you to manage your blood sugar (glucose) level. Your blood glucose level can be greatly affected by what you eat. Eating healthier foods in the appropriate amounts throughout the day at about the same time each day will help you control your blood glucose level. It can also help slow or prevent worsening of your diabetes mellitus. Healthy eating may even help you improve the level of your blood pressure and reach or maintain a healthy weight. General recommendations for healthful eating and cooking habits include:  Eating meals and snacks regularly. Avoid going long periods of time without eating to lose weight.  Eating a diet that consists mainly of plant-based foods, such as fruits, vegetables, nuts, legumes, and whole grains.  Using low-heat cooking methods, such as baking, instead of high-heat cooking methods, such as deep frying.  Work with your dietitian to make sure you understand how to use the Nutrition Facts information on food labels. How can food affect me? Carbohydrates Carbohydrates affect your blood glucose level more than any other type of food. Your dietitian will help you determine how many carbohydrates to eat at each meal and teach you how to count carbohydrates. Counting carbohydrates is important to keep your blood glucose at a healthy level, especially if you are using insulin or taking certain medicines for diabetes mellitus. Alcohol Alcohol can cause sudden decreases in blood glucose (hypoglycemia), especially if you use insulin or take certain medicines for diabetes mellitus. Hypoglycemia can be a life-threatening condition. Symptoms of hypoglycemia (sleepiness, dizziness, and disorientation) are similar to symptoms of having too much alcohol. If your health care provider has given you approval to drink alcohol, do so in moderation and use the following guidelines:  Women should not have more than one drink per day, and men  should not have more than two drinks per day. One drink is equal to: ? 12 oz of beer. ? 5 oz of wine. ? 1 oz of hard liquor.  Do not drink on an empty stomach.  Keep yourself hydrated. Have water, diet soda, or unsweetened iced tea.  Regular soda, juice, and other mixers might contain a lot of carbohydrates and should be counted.  What foods are not recommended? As you make food choices, it is important to remember that all foods are not the same. Some foods have fewer nutrients per serving than other foods, even though they might have the same number of calories or carbohydrates. It is difficult to get your body what it needs when you eat foods with fewer nutrients. Examples of foods that you should avoid that are high in calories and carbohydrates but low in nutrients include:  Trans fats (most processed foods list trans fats on the Nutrition Facts label).  Regular soda.  Juice.  Candy.  Sweets, such as cake, pie, doughnuts, and cookies.  Fried foods.  What foods can I eat? Eat nutrient-rich foods, which will nourish your body and keep you healthy. The food you should eat also will depend on several factors, including:  The calories you need.  The medicines you take.  Your weight.  Your blood glucose level.  Your blood pressure level.  Your cholesterol level.  You should eat a variety of foods, including:  Protein. ? Lean cuts of meat. ? Proteins low in saturated fats, such as fish, egg whites, and beans. Avoid processed meats.  Fruits and vegetables. ? Fruits and vegetables that may help control blood glucose levels, such as apples,   mangoes, and yams.  Dairy products. ? Choose fat-free or low-fat dairy products, such as milk, yogurt, and cheese.  Grains, bread, pasta, and rice. ? Choose whole grain products, such as multigrain bread, whole oats, and brown rice. These foods may help control blood pressure.  Fats. ? Foods containing healthful fats, such as  nuts, avocado, olive oil, canola oil, and fish.  Does everyone with diabetes mellitus have the same meal plan? Because every person with diabetes mellitus is different, there is not one meal plan that works for everyone. It is very important that you meet with a dietitian who will help you create a meal plan that is just right for you. This information is not intended to replace advice given to you by your health care provider. Make sure you discuss any questions you have with your health care provider. Document Released: 01/11/2005 Document Revised: 09/22/2015 Document Reviewed: 03/13/2013 Elsevier Interactive Patient Education  2017 Elsevier Inc.  

## 2016-03-20 NOTE — Progress Notes (Signed)
DM f/u

## 2016-03-21 ENCOUNTER — Encounter: Payer: Self-pay | Admitting: Family Medicine

## 2016-03-21 LAB — GLUCOSE, POCT (MANUAL RESULT ENTRY)
POC GLUCOSE: 390 mg/dL — AB (ref 70–99)
POC Glucose: 510 mg/dl — AB (ref 70–99)

## 2016-03-27 ENCOUNTER — Ambulatory Visit: Payer: Medicaid Other | Attending: Family Medicine

## 2016-03-27 DIAGNOSIS — E1065 Type 1 diabetes mellitus with hyperglycemia: Secondary | ICD-10-CM | POA: Insufficient documentation

## 2016-03-27 DIAGNOSIS — IMO0002 Reserved for concepts with insufficient information to code with codable children: Secondary | ICD-10-CM

## 2016-03-27 DIAGNOSIS — R634 Abnormal weight loss: Secondary | ICD-10-CM

## 2016-03-27 DIAGNOSIS — E108 Type 1 diabetes mellitus with unspecified complications: Secondary | ICD-10-CM

## 2016-03-27 LAB — LIPID PANEL
CHOL/HDL RATIO: 5.8 ratio — AB (ref <5.0)
CHOLESTEROL: 272 mg/dL — AB (ref <200)
HDL: 47 mg/dL — AB (ref >50)
LDL Cholesterol: 192 mg/dL — ABNORMAL HIGH (ref <100)
Triglycerides: 163 mg/dL — ABNORMAL HIGH (ref <150)
VLDL: 33 mg/dL — ABNORMAL HIGH (ref <30)

## 2016-03-27 LAB — TSH: TSH: 2.93 mIU/L

## 2016-03-27 NOTE — Progress Notes (Signed)
Patient here for lab visit only 

## 2016-03-28 ENCOUNTER — Other Ambulatory Visit: Payer: Self-pay | Admitting: Family Medicine

## 2016-03-28 DIAGNOSIS — E7800 Pure hypercholesterolemia, unspecified: Secondary | ICD-10-CM

## 2016-03-28 DIAGNOSIS — E785 Hyperlipidemia, unspecified: Secondary | ICD-10-CM

## 2016-03-28 HISTORY — DX: Hyperlipidemia, unspecified: E78.5

## 2016-03-28 LAB — MICROALBUMIN / CREATININE URINE RATIO
Creatinine, Urine: 150 mg/dL (ref 20–320)
Microalb Creat Ratio: 48 mcg/mg creat — ABNORMAL HIGH (ref <30)
Microalb, Ur: 7.2 mg/dL

## 2016-03-28 MED ORDER — LISINOPRIL 2.5 MG PO TABS: 2.5000 mg | ORAL_TABLET | Freq: Every day | ORAL | 2 refills | Status: DC

## 2016-03-28 MED ORDER — ATORVASTATIN CALCIUM 20 MG PO TABS
20.0000 mg | ORAL_TABLET | Freq: Every day | ORAL | 3 refills | Status: DC
Start: 2016-03-28 — End: 2016-06-25

## 2016-04-03 ENCOUNTER — Telehealth: Payer: Self-pay

## 2016-04-03 NOTE — Telephone Encounter (Signed)
Writer LVM requesting patient to call back to discuss lab results.

## 2016-04-03 NOTE — Telephone Encounter (Signed)
-----   Message from Arnoldo Morale, MD sent at 03/28/2016  1:50 PM EST ----- Cholesterol is elevated and I have sent a prescription for atorvastatin to the pharmacy. She does have some microalbuminuria which is a reflection of uncontrolled diabetes on her kidneys. I have placed her on low-dose lisinopril.

## 2016-04-04 ENCOUNTER — Telehealth: Payer: Self-pay | Admitting: Family Medicine

## 2016-04-04 NOTE — Telephone Encounter (Signed)
Patient called and is aware of lab results. Patient understood.

## 2016-04-04 NOTE — Telephone Encounter (Signed)
Patient aware of lab results.

## 2016-04-07 ENCOUNTER — Emergency Department (HOSPITAL_COMMUNITY)
Admission: EM | Admit: 2016-04-07 | Discharge: 2016-04-07 | Disposition: A | Discharge status: Home / Self Care | Payer: Medicaid Other | Attending: Emergency Medicine | Admitting: Emergency Medicine

## 2016-04-07 ENCOUNTER — Encounter (HOSPITAL_COMMUNITY): Payer: Self-pay | Admitting: Emergency Medicine

## 2016-04-07 DIAGNOSIS — E1040 Type 1 diabetes mellitus with diabetic neuropathy, unspecified: Secondary | ICD-10-CM | POA: Insufficient documentation

## 2016-04-07 DIAGNOSIS — M436 Torticollis: Secondary | ICD-10-CM | POA: Insufficient documentation

## 2016-04-07 MED ORDER — DIAZEPAM 5 MG PO TABS: 5.0000 mg | ORAL_TABLET | Freq: Four times a day (QID) | ORAL | 0 refills | Status: DC | PRN

## 2016-04-07 MED ORDER — DIAZEPAM 5 MG PO TABS
5.0000 mg | ORAL_TABLET | Freq: Once | ORAL | Status: AC
  Administered 2016-04-07: 5 mg via ORAL
  Filled 2016-04-07: qty 1

## 2016-04-07 MED ORDER — TRAMADOL HCL 50 MG PO TABS: 50.0000 mg | ORAL_TABLET | Freq: Four times a day (QID) | ORAL | 0 refills | Status: DC | PRN

## 2016-04-07 MED ORDER — KETOROLAC TROMETHAMINE 60 MG/2ML IM SOLN
30.0000 mg | Freq: Once | INTRAMUSCULAR | Status: AC
  Administered 2016-04-07: 30 mg via INTRAMUSCULAR
  Filled 2016-04-07: qty 2

## 2016-04-07 NOTE — ED Triage Notes (Addendum)
Pt states she started to have pains in her neck that progressively got worse. Pt advised she has a knot on the right side of her neck as well. Pt advised she took ibuprofen but it only helped for an hour. Pt denies any accident or injury.

## 2016-04-07 NOTE — ED Provider Notes (Signed)
[facility] DEPT Provider Note   CSN: [id number] Arrival date & time: 04/07/16  4098     History   Chief Complaint Chief Complaint  Patient presents with  . Neck Pain    HPI Ashley Freeman is a 25 y.o. female.  Patient presents with complaints of neck pain. Patient reports that she has had progressively worsening neck pain for 2 days. Pain is on the right side of her neck. Pain is now become severe, causing her to be unable to turn her head to the left. She denies any injury. She took ibuprofen earlier today with some relief, but now pain is returning and worsening.      Past Medical History:  Diagnosis Date  . Diabetes mellitus 2001   Diagnosed at age 72 ; Type I  . DKA (diabetic ketoacidoses) ([facility]) 08/19/2013  . Gonorrhea 08/2011   Treated in 09/2011    Patient Active Problem List   Diagnosis Date Noted  . Hyperlipidemia 03/28/2016  . Diabetic neuropathy ([place]) 01/16/2016  . DKA, type 1 ([place]) 11/24/2015  . BV (bacterial vaginosis) 11/24/2015  . Chest pain 11/24/2015  . AKI (acute kidney injury) ([place]) 07/26/2014  . Infection of urinary tract 04/08/2014  . PID (acute pelvic inflammatory disease) 04/06/2014  . Diabetic ketoacidosis without coma associated with type 1 diabetes mellitus ([place]) 09/19/2013  . Infected cyst of Bartholin's gland duct 09/19/2013  . Bartholin's gland abscess 09/19/2013  . Sepsis ([place]) 09/19/2013  . Abscess, gluteal, right 08/24/2013  . Health care maintenance 08/24/2013  . Hypophosphatemia 08/18/2013  . Hypomagnesemia 08/18/2013  . Leukocytosis, unspecified 08/17/2013  . Fever, unspecified 08/17/2013  . H/O medication noncompliance 08/12/2013  . Hyponatremia 08/04/2012  . Anemia 02/19/2012  . Hyperglycemia 11/06/2011  . Sinus tachycardia ([place]) 10/17/2011  . Diabetes mellitus type 1, uncontrolled, with complications ([place]) 11/91/4782    Past Surgical History:  Procedure Laterality Date  . INCISION AND DRAINAGE PERIRECTAL  ABSCESS Right 08/18/2013   Procedure: IRRIGATION AND DEBRIDEMENT GLUTEAL ABSCESS;  Surgeon: Ralene Ok, MD;  Location: [facility];  Service: General;  Laterality: Right;  . INCISION AND DRAINAGE PERIRECTAL ABSCESS Right 09/19/2013   Procedure: IRRIGATION AND DEBRIDEMENT RIGHT GLUTEAL AND LABIAL ABSCESSES;  Surgeon: Ralene Ok, MD;  Location: [facility];  Service: General;  Laterality: Right;  . INCISION AND DRAINAGE PERIRECTAL ABSCESS Right 09/24/2013   Procedure: IRRIGATION AND DEBRIDEMENT PERIRECTAL ABSCESS;  Surgeon: Gwenyth Ober, MD;  Location: [facility];  Service: General;  Laterality: Right;  . NO PAST SURGERIES      OB History    Gravida Para Term Preterm AB Living   1 0     1 0   SAB TAB Ectopic Multiple Live Births   1               Home Medications    Prior to Admission medications   Medication Sig Start Date End Date Taking? Authorizing Provider  atorvastatin (LIPITOR) 20 MG tablet Take 1 tablet (20 mg total) by mouth daily. 03/28/16   Arnoldo Morale, MD  diazepam (VALIUM) 5 MG tablet Take 1 tablet (5 mg total) by mouth every 6 (six) hours as needed for muscle spasms. 04/07/16   Orpah Greek, MD  gabapentin (NEURONTIN) 300 MG capsule Take 1 capsule (300 mg total) by mouth 2 (two) times daily. Patient not taking: Reported on 03/20/2016 01/16/16   Arnoldo Morale, MD  glucose blood (TRUE METRIX BLOOD GLUCOSE TEST) test strip Use 3 times daily before meals 01/16/16  Arnoldo Morale, MD  insulin aspart (NOVOLOG FLEXPEN) 100 UNIT/ML FlexPen Inject 0-12 Units into the skin 3 (three) times daily with meals. As per sliding scale 01/16/16   Arnoldo Morale, MD  Insulin Glargine (LANTUS) 100 UNIT/ML Solostar Pen Inject 25 Units into the skin at bedtime. 03/20/16   Arnoldo Morale, MD  lisinopril (PRINIVIL,ZESTRIL) 2.5 MG tablet Take 1 tablet (2.5 mg total) by mouth daily. 03/28/16   Arnoldo Morale, MD  traMADol (ULTRAM) 50 MG tablet Take 1 tablet (50 mg total) by mouth every 6 (six) hours as needed.  04/07/16   Orpah Greek, MD    Family History Family History  Problem Relation Age of Onset  . Asthma Mother   . Gout Father   . Diabetes Paternal Grandmother   . Anesthesia problems Neg Hx     Social History Social History  Substance Use Topics  . Smoking status: Never Smoker  . Smokeless tobacco: Never Used  . Alcohol use No     Comment: occ     Allergies   Penicillins   Review of Systems Review of Systems  Musculoskeletal: Positive for neck pain.  All other systems reviewed and are negative.    Physical Exam Updated Vital Signs BP 128/96 Comment: Simultaneous filing. User may not have seen previous data.  Pulse 111 Comment: Simultaneous filing. User may not have seen previous data.  Temp 98.6 F (37 C) (Oral)   Resp 16   Ht 5\' 3"  (1.6 m)   Wt 120 lb (54.4 kg)   LMP 03/19/2016 (Exact Date)   SpO2 100% Comment: Simultaneous filing. User may not have seen previous data.  BMI 21.26 kg/m   Physical Exam  Constitutional: She is oriented to person, place, and time. She appears well-developed and well-nourished. No distress.  HENT:  Head: Normocephalic and atraumatic.  Right Ear: Hearing normal.  Left Ear: Hearing normal.  Nose: Nose normal.  Mouth/Throat: Oropharynx is clear and moist and mucous membranes are normal.  Eyes: Conjunctivae and EOM are normal. Pupils are equal, round, and reactive to light.  Neck: Normal range of motion. Neck supple. Spinous process tenderness (right side with spasm - head held turned to the right) present.  Cardiovascular: Regular rhythm, S1 normal and S2 normal.  Exam reveals no gallop and no friction rub.   No murmur heard. Pulmonary/Chest: Effort normal and breath sounds normal. No respiratory distress. She exhibits no tenderness.  Abdominal: Soft. Normal appearance and bowel sounds are normal. There is no hepatosplenomegaly. There is no tenderness. There is no rebound, no guarding, no tenderness at McBurney's point and  negative Murphy's sign. No hernia.  Musculoskeletal: Normal range of motion.  Neurological: She is alert and oriented to person, place, and time. She has normal strength. No cranial nerve deficit or sensory deficit. Coordination normal. GCS eye subscore is 4. GCS verbal subscore is 5. GCS motor subscore is 6.  Skin: Skin is warm, dry and intact. No rash noted. No cyanosis.  Psychiatric: She has a normal mood and affect. Her speech is normal and behavior is normal. Thought content normal.  Nursing note and vitals reviewed.    ED Treatments / Results  Labs (all labs ordered are listed, but only abnormal results are displayed) Labs Reviewed - No data to display  EKG  EKG Interpretation None       Radiology No results found.  Procedures Procedures (including critical care time)  Medications Ordered in ED Medications  ketorolac (TORADOL) injection 30 mg (30 mg  Intramuscular Given 04/07/16 0412)  diazepam (VALIUM) tablet 5 mg (5 mg Oral Given 04/07/16 0410)     Initial Impression / Assessment and Plan / ED Course  I have reviewed the triage vital signs and the nursing notes.  Pertinent labs & imaging results that were available during my care of the patient were reviewed by me and considered in my medical decision making (see chart for details).  Clinical Course    Patient presents with progressively worsening right-sided neck pain with acute spasm of the paraspinal muscles. Presentation and examination is consistent with torticollis. No infectious symptoms.  Final Clinical Impressions(s) / ED Diagnoses   Final diagnoses:  Torticollis, acute    New Prescriptions New Prescriptions   DIAZEPAM (VALIUM) 5 MG TABLET    Take 1 tablet (5 mg total) by mouth every 6 (six) hours as needed for muscle spasms.   TRAMADOL (ULTRAM) 50 MG TABLET    Take 1 tablet (50 mg total) by mouth every 6 (six) hours as needed.     Orpah Greek, MD 04/07/16 [phone]

## 2016-04-29 ENCOUNTER — Encounter (HOSPITAL_COMMUNITY): Payer: Self-pay

## 2016-04-29 ENCOUNTER — Emergency Department (HOSPITAL_COMMUNITY)
Admission: EM | Admit: 2016-04-29 | Discharge: 2016-04-29 | Disposition: A | Discharge status: Home / Self Care | Payer: Medicaid Other | Attending: Emergency Medicine | Admitting: Emergency Medicine

## 2016-04-29 DIAGNOSIS — Z9114 Patient's other noncompliance with medication regimen: Secondary | ICD-10-CM

## 2016-04-29 DIAGNOSIS — Z9119 Patient's noncompliance with other medical treatment and regimen: Secondary | ICD-10-CM | POA: Insufficient documentation

## 2016-04-29 DIAGNOSIS — E1065 Type 1 diabetes mellitus with hyperglycemia: Secondary | ICD-10-CM | POA: Insufficient documentation

## 2016-04-29 DIAGNOSIS — Z79899 Other long term (current) drug therapy: Secondary | ICD-10-CM | POA: Insufficient documentation

## 2016-04-29 DIAGNOSIS — E1040 Type 1 diabetes mellitus with diabetic neuropathy, unspecified: Secondary | ICD-10-CM | POA: Insufficient documentation

## 2016-04-29 DIAGNOSIS — R739 Hyperglycemia, unspecified: Secondary | ICD-10-CM

## 2016-04-29 LAB — CBG MONITORING, ED
Glucose-Capillary: >600 mg/dL (ref 65–99)
Glucose-Capillary: 432 mg/dL — ABNORMAL HIGH (ref 65–99)
Glucose-Capillary: 282 mg/dL — ABNORMAL HIGH (ref 65–99)

## 2016-04-29 LAB — URINALYSIS, ROUTINE W REFLEX MICROSCOPIC
BILIRUBIN URINE: NEGATIVE
Hgb urine dipstick: NEGATIVE
KETONES UR: 5 mg/dL — AB
LEUKOCYTES UA: NEGATIVE
NITRITE: NEGATIVE
PROTEIN: NEGATIVE mg/dL
Specific Gravity, Urine: 1.03 (ref 1.005–1.030)
pH: 6 (ref 5.0–8.0)

## 2016-04-29 LAB — BASIC METABOLIC PANEL
Anion gap: 13 (ref 5–15)
BUN: 6 mg/dL (ref 6–20)
CO2: 27 mmol/L (ref 22–32)
CREATININE: 0.84 mg/dL (ref 0.44–1.00)
Calcium: 10 mg/dL (ref 8.9–10.3)
Chloride: 83 mmol/L — ABNORMAL LOW (ref 101–111)
GFR calc non Af Amer: >60 mL/min (ref >60)
Glucose, Bld: 777 mg/dL (ref 65–99)
POTASSIUM: 4 mmol/L (ref 3.5–5.1)
SODIUM: 123 mmol/L — AB (ref 135–145)

## 2016-04-29 LAB — I-STAT VENOUS BLOOD GAS, ED
ACID-BASE EXCESS: 3 mmol/L — AB (ref 0.0–2.0)
BICARBONATE: 28.8 mmol/L — AB (ref 20.0–28.0)
O2 SAT: 44 %
PCO2 VEN: 45.3 mmHg (ref 44.0–60.0)
PO2 VEN: 24 mmHg — AB (ref 32.0–45.0)
TCO2: 30 mmol/L (ref 0–100)
pH, Ven: 7.412 (ref 7.250–7.430)

## 2016-04-29 LAB — I-STAT CHEM 8, ED
BUN: 7 mg/dL (ref 6–20)
Calcium, Ion: 1.14 mmol/L — ABNORMAL LOW (ref 1.15–1.40)
Chloride: 84 mmol/L — ABNORMAL LOW (ref 101–111)
Creatinine, Ser: 0.6 mg/dL (ref 0.44–1.00)
HCT: 48 % — ABNORMAL HIGH (ref 36.0–46.0)
Hemoglobin: 16.3 g/dL — ABNORMAL HIGH (ref 12.0–15.0)
POTASSIUM: 4 mmol/L (ref 3.5–5.1)
Sodium: 124 mmol/L — ABNORMAL LOW (ref 135–145)
TCO2: 27 mmol/L (ref 0–100)

## 2016-04-29 LAB — CBC
HCT: 42.1 % (ref 36.0–46.0)
Hemoglobin: 14.5 g/dL (ref 12.0–15.0)
MCH: 27.7 pg (ref 26.0–34.0)
MCHC: 34.4 g/dL (ref 30.0–36.0)
MCV: 80.5 fL (ref 78.0–100.0)
PLATELETS: 339 10*3/uL (ref 150–400)
RBC: 5.23 MIL/uL — AB (ref 3.87–5.11)
RDW: 12.5 % (ref 11.5–15.5)
WBC: 7.2 10*3/uL (ref 4.0–10.5)

## 2016-04-29 LAB — I-STAT BETA HCG BLOOD, ED (MC, WL, AP ONLY): I-stat hCG, quantitative: <5 m[IU]/mL (ref <5)

## 2016-04-29 MED ORDER — SODIUM CHLORIDE 0.9 % IV BOLUS (SEPSIS)
2000.0000 mL | Freq: Once | INTRAVENOUS | Status: AC
  Administered 2016-04-29: 2000 mL via INTRAVENOUS

## 2016-04-29 MED ORDER — INSULIN ASPART 100 UNIT/ML ~~LOC~~ SOLN
10.0000 [IU] | Freq: Once | SUBCUTANEOUS | Status: AC
  Administered 2016-04-29: 10 [IU] via INTRAVENOUS
  Filled 2016-04-29: qty 1

## 2016-04-29 NOTE — ED Triage Notes (Addendum)
BS has been "high".  Pt just wanting to sleep, no other s/s noted.  No recent illness.

## 2016-04-29 NOTE — ED Provider Notes (Signed)
[facility] DEPT Provider Note   CSN: [id number] Arrival date & time: 04/29/16  1114     History   Chief Complaint Chief Complaint  Patient presents with  . Hyperglycemia   HPI   Blood pressure 97/81, pulse (!) 129, temperature 97.7 F (36.5 C), temperature source Oral, resp. rate 16, height 5\' 3"  (1.6 m), weight 49.3 kg, last menstrual period 03/14/2016, SpO2 100 %.  Ashley Freeman is a 25 y.o. female complaining of low sugars reading high over the last couple of days, she endorses generalized fatigue with polyuria and polydipsia. Been compliant with her insulin over the last 2 days but states she was not using it regularly over Christmas. She been in her regular state of health with no fever, chills, cough, pain. She notes that her last period was November 15, this is not abnormal for her as her periods are very irregular. No other complaints  Past Medical History:  Diagnosis Date  . Diabetes mellitus 2001   Diagnosed at age 89 ; Type I  . DKA (diabetic ketoacidoses) ([place]) 08/19/2013  . Gonorrhea 08/2011   Treated in 09/2011    Patient Active Problem List   Diagnosis Date Noted  . Hyperlipidemia 03/28/2016  . Diabetic neuropathy ([place]) 01/16/2016  . DKA, type 1 ([place]) 11/24/2015  . BV (bacterial vaginosis) 11/24/2015  . Chest pain 11/24/2015  . AKI (acute kidney injury) ([place]) 07/26/2014  . Infection of urinary tract 04/08/2014  . PID (acute pelvic inflammatory disease) 04/06/2014  . Diabetic ketoacidosis without coma associated with type 1 diabetes mellitus ([place]) 09/19/2013  . Infected cyst of Bartholin's gland duct 09/19/2013  . Bartholin's gland abscess 09/19/2013  . Sepsis ([place]) 09/19/2013  . Abscess, gluteal, right 08/24/2013  . Health care maintenance 08/24/2013  . Hypophosphatemia 08/18/2013  . Hypomagnesemia 08/18/2013  . Leukocytosis, unspecified 08/17/2013  . Fever, unspecified 08/17/2013  . H/O medication noncompliance 08/12/2013  . Hyponatremia  08/04/2012  . Anemia 02/19/2012  . Hyperglycemia 11/06/2011  . Sinus tachycardia ([place]) 10/17/2011  . Diabetes mellitus type 1, uncontrolled, with complications ([place]) 99/83/3825    Past Surgical History:  Procedure Laterality Date  . INCISION AND DRAINAGE PERIRECTAL ABSCESS Right 08/18/2013   Procedure: IRRIGATION AND DEBRIDEMENT GLUTEAL ABSCESS;  Surgeon: Ralene Ok, MD;  Location: [facility];  Service: General;  Laterality: Right;  . INCISION AND DRAINAGE PERIRECTAL ABSCESS Right 09/19/2013   Procedure: IRRIGATION AND DEBRIDEMENT RIGHT GLUTEAL AND LABIAL ABSCESSES;  Surgeon: Ralene Ok, MD;  Location: [facility];  Service: General;  Laterality: Right;  . INCISION AND DRAINAGE PERIRECTAL ABSCESS Right 09/24/2013   Procedure: IRRIGATION AND DEBRIDEMENT PERIRECTAL ABSCESS;  Surgeon: Gwenyth Ober, MD;  Location: [facility];  Service: General;  Laterality: Right;  . NO PAST SURGERIES      OB History    Gravida Para Term Preterm AB Living   1 0     1 0   SAB TAB Ectopic Multiple Live Births   1               Home Medications    Prior to Admission medications   Medication Sig Start Date End Date Taking? Authorizing Provider  atorvastatin (LIPITOR) 20 MG tablet Take 1 tablet (20 mg total) by mouth daily. 03/28/16   Arnoldo Morale, MD  diazepam (VALIUM) 5 MG tablet Take 1 tablet (5 mg total) by mouth every 6 (six) hours as needed for muscle spasms. 04/07/16   Orpah Greek, MD  gabapentin (NEURONTIN) 300 MG capsule  Take 1 capsule (300 mg total) by mouth 2 (two) times daily. Patient not taking: Reported on 03/20/2016 01/16/16   Arnoldo Morale, MD  glucose blood (TRUE METRIX BLOOD GLUCOSE TEST) test strip Use 3 times daily before meals 01/16/16   Arnoldo Morale, MD  insulin aspart (NOVOLOG FLEXPEN) 100 UNIT/ML FlexPen Inject 0-12 Units into the skin 3 (three) times daily with meals. As per sliding scale 01/16/16   Arnoldo Morale, MD  Insulin Glargine (LANTUS) 100 UNIT/ML Solostar Pen Inject 25 Units  into the skin at bedtime. 03/20/16   Arnoldo Morale, MD  lisinopril (PRINIVIL,ZESTRIL) 2.5 MG tablet Take 1 tablet (2.5 mg total) by mouth daily. 03/28/16   Arnoldo Morale, MD  traMADol (ULTRAM) 50 MG tablet Take 1 tablet (50 mg total) by mouth every 6 (six) hours as needed. 04/07/16   Orpah Greek, MD    Family History Family History  Problem Relation Age of Onset  . Asthma Mother   . Gout Father   . Diabetes Paternal Grandmother   . Anesthesia problems Neg Hx     Social History Social History  Substance Use Topics  . Smoking status: Never Smoker  . Smokeless tobacco: Never Used  . Alcohol use No     Comment: occ     Allergies   Penicillins   Review of Systems Review of Systems  10 systems reviewed and found to be negative, except as noted in the HPI.   Physical Exam Updated Vital Signs BP 110/70   Pulse 100   Temp 97.7 F (36.5 C) (Oral)   Resp 17   Ht 5\' 3"  (1.6 m)   Wt 49.3 kg   LMP 03/14/2016   SpO2 100%   BMI 19.24 kg/m   Physical Exam  Constitutional: She is oriented to person, place, and time. She appears well-developed and well-nourished. No distress.  Nontoxic appearing, alert  HENT:  Head: Normocephalic and atraumatic.  Mouth/Throat: Oropharynx is clear and moist.  Eyes: Conjunctivae and EOM are normal. Pupils are equal, round, and reactive to light.  Neck: Normal range of motion.  Cardiovascular: Regular rhythm and intact distal pulses.   Tachycardic in the 120s regular  Pulmonary/Chest: Effort normal and breath sounds normal. No respiratory distress. She has no wheezes. She has no rales. She exhibits no tenderness.  Abdominal: Soft. She exhibits no distension and no mass. There is no tenderness. There is no rebound and no guarding. No hernia.  Musculoskeletal: Normal range of motion.  Neurological: She is alert and oriented to person, place, and time.  Skin: She is not diaphoretic.  Psychiatric: She has a normal mood and affect.    Nursing note and vitals reviewed.    ED Treatments / Results  Labs (all labs ordered are listed, but only abnormal results are displayed) Labs Reviewed  BASIC METABOLIC PANEL - Abnormal; Notable for the following:       Result Value   Sodium 123 (*)    Chloride 83 (*)    Glucose, Bld 777 (*)    All other components within normal limits  CBC - Abnormal; Notable for the following:    RBC 5.23 (*)    All other components within normal limits  URINALYSIS, ROUTINE W REFLEX MICROSCOPIC - Abnormal; Notable for the following:    Color, Urine STRAW (*)    Glucose, UA >=500 (*)    Ketones, ur 5 (*)    Bacteria, UA RARE (*)    Squamous Epithelial / LPF 0-5 (*)  All other components within normal limits  CBG MONITORING, ED - Abnormal; Notable for the following:    Glucose-Capillary >600 (*)    All other components within normal limits  I-STAT CHEM 8, ED - Abnormal; Notable for the following:    Sodium 124 (*)    Chloride 84 (*)    Glucose, Bld >700 (*)    Calcium, Ion 1.14 (*)    Hemoglobin 16.3 (*)    HCT 48.0 (*)    All other components within normal limits  I-STAT VENOUS BLOOD GAS, ED - Abnormal; Notable for the following:    pO2, Ven 24.0 (*)    Bicarbonate 28.8 (*)    Acid-Base Excess 3.0 (*)    All other components within normal limits  CBG MONITORING, ED - Abnormal; Notable for the following:    Glucose-Capillary 432 (*)    All other components within normal limits  CBG MONITORING, ED - Abnormal; Notable for the following:    Glucose-Capillary 282 (*)    All other components within normal limits  BLOOD GAS, VENOUS  I-STAT BETA HCG BLOOD, ED ([facility], [facility], [facility] ONLY)    EKG  EKG Interpretation None       Radiology No results found.  Procedures Procedures (including critical care time)  Medications Ordered in ED Medications  sodium chloride 0.9 % bolus 2,000 mL (0 mLs Intravenous Stopped 04/29/16 1347)  insulin aspart (novoLOG) injection 10 Units (10 Units  Intravenous Given 04/29/16 1347)  sodium chloride 0.9 % bolus 2,000 mL (2,000 mLs Intravenous New Bag/Given 04/29/16 1432)     Initial Impression / Assessment and Plan / ED Course  I have reviewed the triage vital signs and the nursing notes.  Pertinent labs & imaging results that were available during my care of the patient were reviewed by me and considered in my medical decision making (see chart for details).  Clinical Course    Vitals:   04/29/16 1130 04/29/16 1134 04/29/16 1415 04/29/16 1500  BP: 97/81  108/73 110/70  Pulse: (!) 129  109 100  Resp: 16  18 17   Temp: 97.7 F (36.5 C)     TempSrc: Oral     SpO2: 100%  100% 100%  Weight:  49.3 kg    Height:  5\' 3"  (1.6 m)      Medications  sodium chloride 0.9 % bolus 2,000 mL (0 mLs Intravenous Stopped 04/29/16 1347)  insulin aspart (novoLOG) injection 10 Units (10 Units Intravenous Given 04/29/16 1347)  sodium chloride 0.9 % bolus 2,000 mL (2,000 mLs Intravenous New Bag/Given 04/29/16 1432)    Ashley Freeman is 25 y.o. female presenting with elevated blood sugar over the last several days. No inciting incidents other than noncompliance with her medication over the holidays. She states that she is tired but she is not lethargic, overall well appearing, mild tachycardia. CBG over 600, will ensure she is not in DKA, aggressive fluid hydration pending further blood work results.  Patient has 5 ketones in the urine but normal anion gap and normal pH, this is not DKA. Patient's blood sugars normalized after aggressive hydration and some IV insulin. I have had an extensive discussion with her on the importance of medication compliance. She verbalized her understanding, she states she will follow-up with her primary care doctor at the [facility].  Evaluation does not show pathology that would require ongoing emergent intervention or inpatient treatment. Pt is hemodynamically stable and mentating appropriately. Discussed  findings and plan with patient/guardian, who  agrees with care plan. All questions answered. Return precautions discussed and outpatient follow up given.      Final Clinical Impressions(s) / ED Diagnoses   Final diagnoses:  Hyperglycemia without ketosis  Non compliance w medication regimen    New Prescriptions New Prescriptions   No medications on file     Monico Blitz, PA-C 04/29/16 [place], MD 04/29/16 [phone]

## 2016-04-29 NOTE — ED Notes (Signed)
Pt sts she cannot urinate at this time 

## 2016-04-29 NOTE — Discharge Instructions (Signed)
It is critically important that you take your insulin as directed.  Please follow with your primary care doctor for recheck in the next week, do not hesitate to return to the emergency department for any new or worsening symptoms.

## 2016-04-29 NOTE — ED Notes (Signed)
Attempted lab draw x 1, unsuccessful.

## 2016-04-29 NOTE — ED Notes (Signed)
Patient verbalized understanding of discharge instructions and denies any further needs or questions at this time. VS stable. Patient ambulatory with steady gait.  

## 2016-04-29 NOTE — ED Notes (Signed)
Pt. Ambulated unassisted to restroom at this time.

## 2016-05-14 ENCOUNTER — Emergency Department (HOSPITAL_COMMUNITY)
Admission: EM | Admit: 2016-05-14 | Discharge: 2016-05-14 | Disposition: A | Discharge status: Home / Self Care | Payer: Medicaid Other | Attending: Emergency Medicine | Admitting: Emergency Medicine

## 2016-05-14 DIAGNOSIS — E1040 Type 1 diabetes mellitus with diabetic neuropathy, unspecified: Secondary | ICD-10-CM | POA: Insufficient documentation

## 2016-05-14 DIAGNOSIS — A599 Trichomoniasis, unspecified: Secondary | ICD-10-CM

## 2016-05-14 DIAGNOSIS — Z79899 Other long term (current) drug therapy: Secondary | ICD-10-CM | POA: Insufficient documentation

## 2016-05-14 LAB — URINALYSIS, ROUTINE W REFLEX MICROSCOPIC
BILIRUBIN URINE: NEGATIVE
Bacteria, UA: NONE SEEN
Glucose, UA: >=500 mg/dL — AB
HGB URINE DIPSTICK: NEGATIVE
KETONES UR: NEGATIVE mg/dL
NITRITE: NEGATIVE
Protein, ur: NEGATIVE mg/dL
Specific Gravity, Urine: 1.023 (ref 1.005–1.030)
pH: 7 (ref 5.0–8.0)

## 2016-05-14 LAB — WET PREP, GENITAL
Sperm: NONE SEEN
YEAST WET PREP: NONE SEEN

## 2016-05-14 MED ORDER — AZITHROMYCIN 1 G PO PACK
1.0000 g | PACK | Freq: Once | ORAL | Status: AC
  Administered 2016-05-14: 1 g via ORAL
  Filled 2016-05-14: qty 1

## 2016-05-14 MED ORDER — METRONIDAZOLE 500 MG PO TABS
500.0000 mg | ORAL_TABLET | Freq: Once | ORAL | Status: AC
  Administered 2016-05-14: 500 mg via ORAL
  Filled 2016-05-14: qty 1

## 2016-05-14 MED ORDER — METHOCARBAMOL 500 MG PO TABS: 500.0000 mg | ORAL_TABLET | Freq: Three times a day (TID) | ORAL | 0 refills | Status: DC | PRN

## 2016-05-14 MED ORDER — METHOCARBAMOL 500 MG PO TABS
500.0000 mg | ORAL_TABLET | Freq: Once | ORAL | Status: AC
Start: 2016-05-14 — End: 2016-05-14
  Administered 2016-05-14: 500 mg via ORAL
  Filled 2016-05-14: qty 1

## 2016-05-14 MED ORDER — METRONIDAZOLE 500 MG PO TABS: 500.0000 mg | ORAL_TABLET | Freq: Two times a day (BID) | ORAL | 0 refills | Status: AC

## 2016-05-14 MED ORDER — CEFTRIAXONE SODIUM 250 MG IJ SOLR
250.0000 mg | Freq: Once | INTRAMUSCULAR | Status: AC
  Administered 2016-05-14: 250 mg via INTRAMUSCULAR
  Filled 2016-05-14: qty 250

## 2016-05-14 NOTE — ED Triage Notes (Signed)
Hurts to void x 2 days  Has a vag d/c, lmp  Was12/14 also has a spot on her neck she wants looked at

## 2016-05-14 NOTE — ED Notes (Addendum)
Pt refused second Stick for blood draw and states she will have it done at her MD office. Phlebotomist tried X1.
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
## 2016-05-15 LAB — GC/CHLAMYDIA PROBE AMP (~~LOC~~) NOT AT ARMC
Chlamydia: NEGATIVE
NEISSERIA GONORRHEA: NEGATIVE

## 2016-05-16 LAB — URINE CULTURE

## 2016-05-17 ENCOUNTER — Telehealth (HOSPITAL_BASED_OUTPATIENT_CLINIC_OR_DEPARTMENT_OTHER): Payer: Self-pay

## 2016-05-17 NOTE — Telephone Encounter (Signed)
Post ED Visit - Positive Culture Follow-up  Culture report reviewed by antimicrobial stewardship pharmacist:  []  [name], Pharm.D. []  [name], Pharm.D., BCPS []  [name], Pharm.D. []  [name], Pharm.D., BCPS []  [place], [place].D., BCPS, AAHIVP []  [name], Pharm.D., BCPS, AAHIVP []  Milus Glazier, Pharm.D. []  [place], [place].DWandalee Ferdinand Arminger, Pharm.D.  Positive urine culture, >/= 100,000 colonies ->staph. epidermidis  Chart reviewed by Clayton Bibles PA-C Contaminant culture "no further treatment needed"  Dortha Kern 05/17/2016, 11:38 AM

## 2016-05-30 ENCOUNTER — Emergency Department (HOSPITAL_COMMUNITY): Payer: Self-pay

## 2016-05-30 ENCOUNTER — Inpatient Hospital Stay (HOSPITAL_COMMUNITY)
Admission: EM | Admit: 2016-05-30 | Discharge: 2016-06-03 | DRG: 639 | Disposition: A | Discharge status: Home / Self Care | Payer: Self-pay | Attending: Family Medicine | Admitting: Family Medicine

## 2016-05-30 ENCOUNTER — Encounter (HOSPITAL_COMMUNITY): Payer: Self-pay | Admitting: Emergency Medicine

## 2016-05-30 DIAGNOSIS — E108 Type 1 diabetes mellitus with unspecified complications: Secondary | ICD-10-CM

## 2016-05-30 DIAGNOSIS — E1110 Type 2 diabetes mellitus with ketoacidosis without coma: Secondary | ICD-10-CM | POA: Diagnosis present

## 2016-05-30 DIAGNOSIS — R197 Diarrhea, unspecified: Secondary | ICD-10-CM | POA: Diagnosis present

## 2016-05-30 DIAGNOSIS — Z825 Family history of asthma and other chronic lower respiratory diseases: Secondary | ICD-10-CM

## 2016-05-30 DIAGNOSIS — E1065 Type 1 diabetes mellitus with hyperglycemia: Secondary | ICD-10-CM

## 2016-05-30 DIAGNOSIS — Z9889 Other specified postprocedural states: Secondary | ICD-10-CM

## 2016-05-30 DIAGNOSIS — E876 Hypokalemia: Secondary | ICD-10-CM | POA: Diagnosis present

## 2016-05-30 DIAGNOSIS — Z880 Allergy status to penicillin: Secondary | ICD-10-CM

## 2016-05-30 DIAGNOSIS — Z794 Long term (current) use of insulin: Secondary | ICD-10-CM

## 2016-05-30 DIAGNOSIS — E1010 Type 1 diabetes mellitus with ketoacidosis without coma: Principal | ICD-10-CM | POA: Diagnosis present

## 2016-05-30 DIAGNOSIS — Z833 Family history of diabetes mellitus: Secondary | ICD-10-CM

## 2016-05-30 DIAGNOSIS — Z79899 Other long term (current) drug therapy: Secondary | ICD-10-CM

## 2016-05-30 DIAGNOSIS — Z3200 Encounter for pregnancy test, result unknown: Secondary | ICD-10-CM

## 2016-05-30 DIAGNOSIS — Z8619 Personal history of other infectious and parasitic diseases: Secondary | ICD-10-CM | POA: Diagnosis not present

## 2016-05-30 DIAGNOSIS — IMO0002 Reserved for concepts with insufficient information to code with codable children: Secondary | ICD-10-CM

## 2016-05-30 HISTORY — DX: Diarrhea, unspecified: R19.7

## 2016-05-30 LAB — URINALYSIS, ROUTINE W REFLEX MICROSCOPIC
Bacteria, UA: NONE SEEN
Bilirubin Urine: NEGATIVE
HGB URINE DIPSTICK: NEGATIVE
KETONES UR: 20 mg/dL — AB
Leukocytes, UA: NEGATIVE
Nitrite: NEGATIVE
Protein, ur: NEGATIVE mg/dL
Specific Gravity, Urine: 1.026 (ref 1.005–1.030)
pH: 5 (ref 5.0–8.0)

## 2016-05-30 LAB — I-STAT VENOUS BLOOD GAS, ED
Acid-base deficit: 12 mmol/L — ABNORMAL HIGH (ref 0.0–2.0)
Bicarbonate: 13.9 mmol/L — ABNORMAL LOW (ref 20.0–28.0)
O2 Saturation: 65 %
PCO2 VEN: 32.2 mmHg — AB (ref 44.0–60.0)
TCO2: 15 mmol/L (ref 0–100)
pH, Ven: 7.244 — ABNORMAL LOW (ref 7.250–7.430)
pO2, Ven: 39 mmHg (ref 32.0–45.0)

## 2016-05-30 LAB — I-STAT BETA HCG BLOOD, ED (MC, WL, AP ONLY): I-stat hCG, quantitative: 641.2 m[IU]/mL — ABNORMAL HIGH (ref <5)

## 2016-05-30 LAB — CBC
HCT: 37.2 % (ref 36.0–46.0)
HEMOGLOBIN: 12.3 g/dL (ref 12.0–15.0)
MCH: 27.3 pg (ref 26.0–34.0)
MCHC: 33.1 g/dL (ref 30.0–36.0)
MCV: 82.5 fL (ref 78.0–100.0)
Platelets: 293 10*3/uL (ref 150–400)
RBC: 4.51 MIL/uL (ref 3.87–5.11)
RDW: 12.7 % (ref 11.5–15.5)
WBC: 9.5 10*3/uL (ref 4.0–10.5)

## 2016-05-30 LAB — COMPREHENSIVE METABOLIC PANEL
ALT: 17 U/L (ref 14–54)
AST: 19 U/L (ref 15–41)
Albumin: 3.8 g/dL (ref 3.5–5.0)
Alkaline Phosphatase: 50 U/L (ref 38–126)
Anion gap: 16 — ABNORMAL HIGH (ref 5–15)
BUN: 15 mg/dL (ref 6–20)
CALCIUM: 9.3 mg/dL (ref 8.9–10.3)
CO2: 14 mmol/L — AB (ref 22–32)
CREATININE: 0.97 mg/dL (ref 0.44–1.00)
Chloride: 91 mmol/L — ABNORMAL LOW (ref 101–111)
GFR calc non Af Amer: >60 mL/min (ref >60)
Glucose, Bld: 781 mg/dL (ref 65–99)
Potassium: 4.3 mmol/L (ref 3.5–5.1)
SODIUM: 121 mmol/L — AB (ref 135–145)
Total Bilirubin: 1.5 mg/dL — ABNORMAL HIGH (ref 0.3–1.2)
Total Protein: 7.1 g/dL (ref 6.5–8.1)

## 2016-05-30 LAB — LIPASE, BLOOD: Lipase: 24 U/L (ref 11–51)

## 2016-05-30 LAB — WET PREP, GENITAL
Sperm: NONE SEEN
Trich, Wet Prep: NONE SEEN
Yeast Wet Prep HPF POC: NONE SEEN

## 2016-05-30 LAB — CBG MONITORING, ED
GLUCOSE-CAPILLARY: 452 mg/dL — AB (ref 65–99)
GLUCOSE-CAPILLARY: 477 mg/dL — AB (ref 65–99)
Glucose-Capillary: 469 mg/dL — ABNORMAL HIGH (ref 65–99)

## 2016-05-30 MED ORDER — INSULIN REGULAR BOLUS VIA INFUSION
0.0000 [IU] | Freq: Three times a day (TID) | INTRAVENOUS | Status: DC
  Administered 2016-05-31: 6 [IU] via INTRAVENOUS
  Administered 2016-06-01: 3 [IU] via INTRAVENOUS
  Filled 2016-05-30: qty 10

## 2016-05-30 MED ORDER — ENOXAPARIN SODIUM 40 MG/0.4ML ~~LOC~~ SOLN: 40.0000 mg | SUBCUTANEOUS | Status: DC

## 2016-05-30 MED ORDER — SODIUM CHLORIDE 0.9 % IV SOLN
INTRAVENOUS | Status: DC
  Administered 2016-05-30: 4.1 [IU]/h via INTRAVENOUS
  Filled 2016-05-30: qty 2.5

## 2016-05-30 MED ORDER — SODIUM CHLORIDE 0.9 % IV SOLN
INTRAVENOUS | Status: DC
  Administered 2016-05-31: 01:00:00 via INTRAVENOUS

## 2016-05-30 MED ORDER — SODIUM CHLORIDE 0.9 % IV BOLUS (SEPSIS)
1000.0000 mL | Freq: Once | INTRAVENOUS | Status: AC
  Administered 2016-05-30: 1000 mL via INTRAVENOUS

## 2016-05-30 MED ORDER — SODIUM CHLORIDE 0.9 % IV SOLN
INTRAVENOUS | Status: DC
  Filled 2016-05-30: qty 2.5

## 2016-05-30 MED ORDER — ONDANSETRON HCL 4 MG PO TABS: 4.0000 mg | ORAL_TABLET | Freq: Four times a day (QID) | ORAL | Status: DC | PRN

## 2016-05-30 MED ORDER — DEXTROSE-NACL 5-0.45 % IV SOLN
INTRAVENOUS | Status: DC
  Administered 2016-05-31 – 2016-06-01 (×5): via INTRAVENOUS

## 2016-05-30 MED ORDER — ALBUTEROL SULFATE (2.5 MG/3ML) 0.083% IN NEBU: 2.5000 mg | INHALATION_SOLUTION | RESPIRATORY_TRACT | Status: DC | PRN

## 2016-05-30 MED ORDER — SODIUM CHLORIDE 0.9 % IV SOLN
INTRAVENOUS | Status: DC
  Administered 2016-05-30: 23:00:00 via INTRAVENOUS

## 2016-05-30 MED ORDER — ACETAMINOPHEN 325 MG PO TABS
650.0000 mg | ORAL_TABLET | Freq: Four times a day (QID) | ORAL | Status: DC | PRN
  Administered 2016-05-31 (×2): 650 mg via ORAL
  Filled 2016-05-30 (×2): qty 2

## 2016-05-30 MED ORDER — SODIUM CHLORIDE 0.9% FLUSH
3.0000 mL | Freq: Two times a day (BID) | INTRAVENOUS | Status: DC
  Administered 2016-05-31 – 2016-06-02 (×5): 3 mL via INTRAVENOUS

## 2016-05-30 MED ORDER — ONDANSETRON HCL 4 MG/2ML IJ SOLN: 4.0000 mg | Freq: Four times a day (QID) | INTRAMUSCULAR | Status: DC | PRN

## 2016-05-30 MED ORDER — DEXTROSE 50 % IV SOLN: 25.0000 mL | INTRAVENOUS | Status: DC | PRN

## 2016-05-30 MED ORDER — ACETAMINOPHEN 650 MG RE SUPP: 650.0000 mg | Freq: Four times a day (QID) | RECTAL | Status: DC | PRN

## 2016-05-30 MED ORDER — DEXTROSE-NACL 5-0.45 % IV SOLN: INTRAVENOUS | Status: DC

## 2016-05-30 MED ORDER — HEPARIN SODIUM (PORCINE) 5000 UNIT/ML IJ SOLN
5000.0000 [IU] | Freq: Three times a day (TID) | INTRAMUSCULAR | Status: DC
  Administered 2016-06-01 – 2016-06-03 (×5): 5000 [IU] via SUBCUTANEOUS
  Filled 2016-05-30 (×5): qty 1

## 2016-05-30 NOTE — ED Notes (Signed)
To us

## 2016-05-30 NOTE — ED Provider Notes (Signed)
[facility] DEPT Provider Note   CSN: [id number] Arrival date & time: 05/30/16  1557     History   Chief Complaint Chief Complaint  Patient presents with  . Abdominal Pain  . Diarrhea    HPI Ashley Freeman is a 26 y.o. female.  The history is provided by the patient. No language interpreter was used.   Ashley Freeman is a 26 y.o. female who presents to the Emergency Department complaining of abdominal pain, diarrhea.  She reports 1 week of generalized abdominal pain described as cramping pain with associated profuse diarrhea. She has diarrhea with every meal. Diarrhea also wakes her at night. She has associated nausea but no fevers or vomiting. Her mother has been sick with similar symptoms. She was recently treated with Flagyl for Trichomonas. LMP was in November. Past Medical History:  Diagnosis Date  . Diabetes mellitus 2001   Diagnosed at age 77 ; Type I  . DKA (diabetic ketoacidoses) ([place]) 08/19/2013  . Gonorrhea 08/2011   Treated in 09/2011    Patient Active Problem List   Diagnosis Date Noted  . DKA (diabetic ketoacidoses) ([place]) 05/30/2016  . Hyperlipidemia 03/28/2016  . Diabetic neuropathy ([place]) 01/16/2016  . DKA, type 1 ([place]) 11/24/2015  . BV (bacterial vaginosis) 11/24/2015  . Chest pain 11/24/2015  . AKI (acute kidney injury) ([place]) 07/26/2014  . Infection of urinary tract 04/08/2014  . PID (acute pelvic inflammatory disease) 04/06/2014  . Diabetic ketoacidosis without coma associated with type 1 diabetes mellitus ([place]) 09/19/2013  . Infected cyst of Bartholin's gland duct 09/19/2013  . Bartholin's gland abscess 09/19/2013  . Sepsis ([place]) 09/19/2013  . Abscess, gluteal, right 08/24/2013  . Health care maintenance 08/24/2013  . Hypophosphatemia 08/18/2013  . Hypomagnesemia 08/18/2013  . Leukocytosis, unspecified 08/17/2013  . Fever, unspecified 08/17/2013  . H/O medication noncompliance 08/12/2013  . Hyponatremia 08/04/2012  . Anemia  02/19/2012  . Hyperglycemia 11/06/2011  . Sinus tachycardia ([place]) 10/17/2011  . Diabetes mellitus type 1, uncontrolled, with complications ([place]) 39/76/7341    Past Surgical History:  Procedure Laterality Date  . INCISION AND DRAINAGE PERIRECTAL ABSCESS Right 08/18/2013   Procedure: IRRIGATION AND DEBRIDEMENT GLUTEAL ABSCESS;  Surgeon: Ralene Ok, MD;  Location: [facility];  Service: General;  Laterality: Right;  . INCISION AND DRAINAGE PERIRECTAL ABSCESS Right 09/19/2013   Procedure: IRRIGATION AND DEBRIDEMENT RIGHT GLUTEAL AND LABIAL ABSCESSES;  Surgeon: Ralene Ok, MD;  Location: [facility];  Service: General;  Laterality: Right;  . INCISION AND DRAINAGE PERIRECTAL ABSCESS Right 09/24/2013   Procedure: IRRIGATION AND DEBRIDEMENT PERIRECTAL ABSCESS;  Surgeon: Gwenyth Ober, MD;  Location: [facility];  Service: General;  Laterality: Right;  . NO PAST SURGERIES      OB History    Gravida Para Term Preterm AB Living   1 0     1 0   SAB TAB Ectopic Multiple Live Births   1               Home Medications    Prior to Admission medications   Medication Sig Start Date End Date Taking? Authorizing Provider  gabapentin (NEURONTIN) 300 MG capsule Take 1 capsule (300 mg total) by mouth 2 (two) times daily. 01/16/16  Yes Arnoldo Morale, MD  glucose blood (TRUE METRIX BLOOD GLUCOSE TEST) test strip Use 3 times daily before meals 01/16/16  Yes Enobong Amao, MD  insulin aspart (NOVOLOG FLEXPEN) 100 UNIT/ML FlexPen Inject 0-12 Units into the skin 3 (three) times daily with meals. As  per sliding scale 01/16/16  Yes Arnoldo Morale, MD  Insulin Glargine (LANTUS) 100 UNIT/ML Solostar Pen Inject 25 Units into the skin at bedtime. Patient taking differently: Inject 25 Units into the skin 2 (two) times daily before a meal.  03/20/16  Yes Arnoldo Morale, MD  traMADol (ULTRAM) 50 MG tablet Take 1 tablet (50 mg total) by mouth every 6 (six) hours as needed. Patient taking differently: Take 50 mg by mouth every 6 (six)  hours as needed (for pain).  04/07/16  Yes Orpah Greek, MD  atorvastatin (LIPITOR) 20 MG tablet Take 1 tablet (20 mg total) by mouth daily. Patient not taking: Reported on 05/30/2016 03/28/16   Arnoldo Morale, MD  diazepam (VALIUM) 5 MG tablet Take 1 tablet (5 mg total) by mouth every 6 (six) hours as needed for muscle spasms. Patient not taking: Reported on 05/30/2016 04/07/16   Orpah Greek, MD  lisinopril (PRINIVIL,ZESTRIL) 2.5 MG tablet Take 1 tablet (2.5 mg total) by mouth daily. Patient not taking: Reported on 05/30/2016 03/28/16   Arnoldo Morale, MD  methocarbamol (ROBAXIN) 500 MG tablet Take 1 tablet (500 mg total) by mouth every 8 (eight) hours as needed for muscle spasms. Patient not taking: Reported on 05/30/2016 05/14/16   Merrily Pew, MD    Family History Family History  Problem Relation Age of Onset  . Asthma Mother   . Gout Father   . Diabetes Paternal Grandmother   . Anesthesia problems Neg Hx     Social History Social History  Substance Use Topics  . Smoking status: Never Smoker  . Smokeless tobacco: Never Used  . Alcohol use No     Comment: occ     Allergies   Penicillins and Benadryl [diphenhydramine]   Review of Systems Review of Systems  All other systems reviewed and are negative.    Physical Exam Updated Vital Signs BP 112/72 (BP Location: Left Arm)   Pulse (!) 123   Temp 99.8 F (37.7 C) (Oral)   Resp 16   Ht 5\' 3"  (1.6 m)   Wt 117 lb 11.6 oz (53.4 kg)   LMP 03/14/2016   SpO2 98%   BMI 20.85 kg/m   Physical Exam  Constitutional: She is oriented to person, place, and time. She appears well-developed and well-nourished.  HENT:  Head: Normocephalic and atraumatic.  Cardiovascular: Regular rhythm.   No murmur heard. tachycardic  Pulmonary/Chest: Effort normal and breath sounds normal. No respiratory distress.  Abdominal: Soft. There is no rebound and no guarding.  Mild diffuse abdominal tenderness  Genitourinary:    Genitourinary Comments: Scant vaginal discharge, no CMT  Musculoskeletal: She exhibits no edema or tenderness.  Neurological: She is alert and oriented to person, place, and time.  Skin: Skin is warm and dry.  Psychiatric: She has a normal mood and affect. Her behavior is normal.  Nursing note and vitals reviewed.    ED Treatments / Results  Labs (all labs ordered are listed, but only abnormal results are displayed) Labs Reviewed  WET PREP, GENITAL - Abnormal; Notable for the following:       Result Value   Clue Cells Wet Prep HPF POC PRESENT (*)    WBC, Wet Prep HPF POC FEW (*)    All other components within normal limits  COMPREHENSIVE METABOLIC PANEL - Abnormal; Notable for the following:    Sodium 121 (*)    Chloride 91 (*)    CO2 14 (*)    Glucose, Bld 781 (*)  Total Bilirubin 1.5 (*)    Anion gap 16 (*)    All other components within normal limits  URINALYSIS, ROUTINE W REFLEX MICROSCOPIC - Abnormal; Notable for the following:    Color, Urine STRAW (*)    Glucose, UA >=500 (*)    Ketones, ur 20 (*)    Squamous Epithelial / LPF 0-5 (*)    All other components within normal limits  BASIC METABOLIC PANEL - Abnormal; Notable for the following:    Sodium 130 (*)    CO2 11 (*)    Glucose, Bld 507 (*)    Calcium 8.6 (*)    Anion gap 16 (*)    All other components within normal limits  GLUCOSE, CAPILLARY - Abnormal; Notable for the following:    Glucose-Capillary 360 (*)    All other components within normal limits  GLUCOSE, CAPILLARY - Abnormal; Notable for the following:    Glucose-Capillary 233 (*)    All other components within normal limits  I-STAT BETA HCG BLOOD, ED ([facility], [facility], [facility] ONLY) - Abnormal; Notable for the following:    I-stat hCG, quantitative 641.2 (*)    All other components within normal limits  CBG MONITORING, ED - Abnormal; Notable for the following:    Glucose-Capillary 477 (*)    All other components within normal limits  I-STAT VENOUS BLOOD  GAS, ED - Abnormal; Notable for the following:    pH, Ven 7.244 (*)    pCO2, Ven 32.2 (*)    Bicarbonate 13.9 (*)    Acid-base deficit 12.0 (*)    All other components within normal limits  CBG MONITORING, ED - Abnormal; Notable for the following:    Glucose-Capillary 469 (*)    All other components within normal limits  CBG MONITORING, ED - Abnormal; Notable for the following:    Glucose-Capillary 452 (*)    All other components within normal limits  C DIFFICILE QUICK SCREEN W PCR REFLEX  GASTROINTESTINAL PANEL BY PCR, STOOL (REPLACES STOOL CULTURE)  MRSA PCR SCREENING  LIPASE, BLOOD  CBC  RPR  HIV ANTIBODY (ROUTINE TESTING)  CBC  BASIC METABOLIC PANEL  HCG, QUANTITATIVE, PREGNANCY  ABO/RH  GC/CHLAMYDIA PROBE AMP ([place]) NOT AT [facility]    EKG  EKG Interpretation None       Radiology US Ob Comp < 14 Wks  Result Date: 05/30/2016 CLINICAL DATA:  26 year old pregnant female with pelvic pain. Estimated gestational age of [redacted] weeks by LMP. Quantitative beta HCG of 641. EXAM: OBSTETRIC <14 WK [place] AND TRANSVAGINAL OB US TECHNIQUE: Both transabdominal and transvaginal ultrasound examinations were performed for complete evaluation of the gestation as well as the maternal uterus, adnexal regions, and pelvic cul-de-sac. Transvaginal technique was performed to assess early pregnancy. COMPARISON:  None. FINDINGS: Intrauterine gestational sac: None Yolk sac:  Not Visualized. Embryo:  Not Visualized. Subchorionic hemorrhage:  None visualized. Maternal uterus/adnexae: No suspicious ovarian abnormalities noted. No adnexal masses. A small amount of free pelvic fluid is noted. IMPRESSION: No intrauterine gestational sac, yolk sac, fetal pole, or cardiac activity visualized. Differential considerations include intrauterine gestation too early to be sonographically visualized, spontaneous abortion, or ectopic pregnancy. Consider follow-up ultrasound in 10 days and serial quantitative beta HCG  follow-up. Small amount of free pelvic fluid which is nonspecific. Electronically Signed   By: Margarette Canada M.D.   On: 05/30/2016 21:13   US Ob Transvaginal  Result Date: 05/30/2016 CLINICAL DATA:  26 year old pregnant female with pelvic pain. Estimated gestational age of [redacted]  weeks by LMP. Quantitative beta HCG of 641. EXAM: OBSTETRIC <14 WK [place] AND TRANSVAGINAL OB US TECHNIQUE: Both transabdominal and transvaginal ultrasound examinations were performed for complete evaluation of the gestation as well as the maternal uterus, adnexal regions, and pelvic cul-de-sac. Transvaginal technique was performed to assess early pregnancy. COMPARISON:  None. FINDINGS: Intrauterine gestational sac: None Yolk sac:  Not Visualized. Embryo:  Not Visualized. Subchorionic hemorrhage:  None visualized. Maternal uterus/adnexae: No suspicious ovarian abnormalities noted. No adnexal masses. A small amount of free pelvic fluid is noted. IMPRESSION: No intrauterine gestational sac, yolk sac, fetal pole, or cardiac activity visualized. Differential considerations include intrauterine gestation too early to be sonographically visualized, spontaneous abortion, or ectopic pregnancy. Consider follow-up ultrasound in 10 days and serial quantitative beta HCG follow-up. Small amount of free pelvic fluid which is nonspecific. Electronically Signed   By: Margarette Canada M.D.   On: 05/30/2016 21:13    Procedures Procedures (including critical care time)  Medications Ordered in ED Medications  dextrose 5 %-0.45 % sodium chloride infusion ( Intravenous New Bag/Given 05/31/16 0156)  insulin regular bolus via infusion 0-10 Units (not administered)  insulin regular (NOVOLIN R,HUMULIN R) 250 Units in sodium chloride 0.9 % 250 mL (1 Units/mL) infusion (3.5 Units/hr Intravenous Rate/Dose Change 05/31/16 0155)  dextrose 50 % solution 25 mL (not administered)  sodium chloride flush (NS) 0.9 % injection 3 mL (not administered)  ondansetron (ZOFRAN) tablet  4 mg (not administered)    Or  ondansetron (ZOFRAN) injection 4 mg (not administered)  acetaminophen (TYLENOL) tablet 650 mg (not administered)    Or  acetaminophen (TYLENOL) suppository 650 mg (not administered)  albuterol (PROVENTIL) (2.5 MG/3ML) 0.083% nebulizer solution 2.5 mg (not administered)  0.9 %  sodium chloride infusion (not administered)  heparin injection 5,000 Units (5,000 Units Subcutaneous Not Given 05/30/16 2345)  sodium chloride 0.9 % bolus 1,000 mL (0 mLs Intravenous Stopped 05/30/16 1946)  sodium chloride 0.9 % bolus 1,000 mL (0 mLs Intravenous Stopped 05/30/16 2159)     Initial Impression / Assessment and Plan / ED Course  I have reviewed the triage vital signs and the nursing notes.  Pertinent labs & imaging results that were available during my care of the patient were reviewed by me and considered in my medical decision making (see chart for details).     Patient with history of diabetes here with generalized abdominal pain, diarrhea. Labs are consistent with DKA. Her hCG is positive and she is sexually active. Pelvic examination is benign and not consistent with PID. Pelvic ultrasound with no definite pregnancy, question if it is too early to identify IUP given her recent negative pregnancy test. Plan to admit to the hospitalist service for DKA given her symptoms. Patient updated findings studies recommendation of admission for further treatment. Also discuss positive pregnancy test and indeterminate pelvic ultrasound.  Final Clinical Impressions(s) / ED Diagnoses   Final diagnoses:  Diabetic ketoacidosis without coma associated with type 1 diabetes mellitus [facility])    New Prescriptions Current Discharge Medication List       Quintella Reichert, MD 05/31/16 0201

## 2016-05-30 NOTE — ED Notes (Signed)
Patient transported to Ultrasound 

## 2016-05-30 NOTE — ED Notes (Signed)
The pt has had  1000 ml of nss 2nd liter started.  The edp wants the pt to get the second  Liter of nss before we start the glucose stabilizer

## 2016-05-30 NOTE — ED Notes (Signed)
Returned from  US  Pelvic performed by the edp  Pt requesting food given

## 2016-05-30 NOTE — ED Triage Notes (Signed)
Pt c/o watery diarrhea and abd cramping for a week-- has been around others that have had the same thing--pt has not taken any OTC meds, is able to eat/drink, but has diarrhea after.

## 2016-05-30 NOTE — ED Notes (Signed)
One unsuccessful attempt to start iv  Iv team  called

## 2016-05-30 NOTE — H&P (Signed)
History and Physical    Ashley Freeman GYK:[id number] DOB: [birth year] DOA: 05/30/2016  Referring MD/NP/PA: Dr. Ayesha Rumpf PCP: Arnoldo Morale, MD  Patient coming from: Home   Chief Complaint: Abdominal pain    HPI: Ashley Freeman is a  26 y.o. female with medical history significant of type 1 diabetes, abdominal pain and diarrhea over the last 1 week. Patient reports generalized stabbing abdominal pain. Patient reports mild improvement of symptoms after having bowel movement. Patient reports having greater than 5 loose bowel movements per day. Associated symptoms include generalized fatigue, cold chills, nausea. Denies any urinary frequency, vaginal discharge, vomiting, or headache. She reports that her blood sugars have been elevated. Last menstrual cycle was sometime in November. Patient reports being sexually active without use of condoms. Just recently completed a week long course of metronidazole for Trichomonas infection.   ED Course: Upon admission patient was seen to be afebrile, pulse up to 117, and all vitals within normal limits. Laboratory revealed sodium 121, potassium 4.3, chloride 91, CO2 14, BUN 15, creatinine 0.97, glucose 781,AG 16, and beta-hCG 641.2. Urinalysis was negative for signs of infection. Wet prep showed few clue cells. Ultrasound did not show any signs of gestational sac.   Review of Systems: As per HPI otherwise 10 point review of systems negative.   Past Medical History:  Diagnosis Date  . Diabetes mellitus 2001   Diagnosed at age 56 ; Type I  . DKA (diabetic ketoacidoses) ([place]) 08/19/2013  . Gonorrhea 08/2011   Treated in 09/2011    Past Surgical History:  Procedure Laterality Date  . INCISION AND DRAINAGE PERIRECTAL ABSCESS Right 08/18/2013   Procedure: IRRIGATION AND DEBRIDEMENT GLUTEAL ABSCESS;  Surgeon: Ralene Ok, MD;  Location: [facility];  Service: General;  Laterality: Right;  . INCISION AND DRAINAGE PERIRECTAL ABSCESS Right 09/19/2013   Procedure: IRRIGATION AND DEBRIDEMENT RIGHT GLUTEAL AND LABIAL ABSCESSES;  Surgeon: Ralene Ok, MD;  Location: [facility];  Service: General;  Laterality: Right;  . INCISION AND DRAINAGE PERIRECTAL ABSCESS Right 09/24/2013   Procedure: IRRIGATION AND DEBRIDEMENT PERIRECTAL ABSCESS;  Surgeon: Gwenyth Ober, MD;  Location: [facility];  Service: General;  Laterality: Right;  . NO PAST SURGERIES       reports that she has never smoked. She has never used smokeless tobacco. She reports that she does not drink alcohol or use drugs.  Allergies  Allergen Reactions  . Penicillins Hives and Rash    Has patient had a PCN reaction causing immediate rash, facial/tongue/throat swelling, SOB or lightheadedness with hypotension: Yes Has patient had a PCN reaction causing severe rash involving mucus membranes or skin necrosis: No Has patient had a PCN reaction that required hospitalization: Yes Has patient had a PCN reaction occurring within the last 10 years: No If all of the above answers are "NO", then may proceed with Cephalosporin use.  . Benadryl [Diphenhydramine] Itching    Family History  Problem Relation Age of Onset  . Asthma Mother   . Gout Father   . Diabetes Paternal Grandmother   . Anesthesia problems Neg Hx     Prior to Admission medications   Medication Sig Start Date End Date Taking? Authorizing Provider  gabapentin (NEURONTIN) 300 MG capsule Take 1 capsule (300 mg total) by mouth 2 (two) times daily. 01/16/16  Yes Arnoldo Morale, MD  glucose blood (TRUE METRIX BLOOD GLUCOSE TEST) test strip Use 3 times daily before meals 01/16/16  Yes Enobong Amao, MD  insulin aspart (NOVOLOG FLEXPEN) 100 UNIT/ML  FlexPen Inject 0-12 Units into the skin 3 (three) times daily with meals. As per sliding scale 01/16/16  Yes Arnoldo Morale, MD  Insulin Glargine (LANTUS) 100 UNIT/ML Solostar Pen Inject 25 Units into the skin at bedtime. Patient taking differently: Inject 25 Units into the skin 2 (two) times daily  before a meal.  03/20/16  Yes Arnoldo Morale, MD  traMADol (ULTRAM) 50 MG tablet Take 1 tablet (50 mg total) by mouth every 6 (six) hours as needed. Patient taking differently: Take 50 mg by mouth every 6 (six) hours as needed (for pain).  04/07/16  Yes Orpah Greek, MD  atorvastatin (LIPITOR) 20 MG tablet Take 1 tablet (20 mg total) by mouth daily. Patient not taking: Reported on 05/30/2016 03/28/16   Arnoldo Morale, MD  diazepam (VALIUM) 5 MG tablet Take 1 tablet (5 mg total) by mouth every 6 (six) hours as needed for muscle spasms. Patient not taking: Reported on 05/30/2016 04/07/16   Orpah Greek, MD  lisinopril (PRINIVIL,ZESTRIL) 2.5 MG tablet Take 1 tablet (2.5 mg total) by mouth daily. Patient not taking: Reported on 05/30/2016 03/28/16   Arnoldo Morale, MD  methocarbamol (ROBAXIN) 500 MG tablet Take 1 tablet (500 mg total) by mouth every 8 (eight) hours as needed for muscle spasms. Patient not taking: Reported on 05/30/2016 05/14/16   Merrily Pew, MD    Physical Exam:   Constitutional: Thin female who appears acutely sick, but nontoxic.  Vitals:   05/30/16 1646 05/30/16 1755 05/30/16 1800 05/30/16 2000  BP:  108/62 121/85 109/73  Pulse:  105 106 115  Resp:  18    Temp:  98.6 F (37 C)    TempSrc:  Oral    SpO2:  100% 99% 100%  Weight: 49 kg (108 lb)      Eyes: PERRL, lids and conjunctivae normal ENMT: Mucous membranes are moist. Posterior pharynx clear of any exudate or lesions.Normal dentition.  Neck: normal, supple, no masses, no thyromegaly Respiratory: clear to auscultation bilaterally, no wheezing, no crackles. Normal respiratory effort. No accessory muscle use.  Cardiovascular: Regular rate and rhythm, no murmurs / rubs / gallops. No extremity edema. 2+ pedal pulses. No carotid bruits.  Abdomen: Generalized tenderness to palpation, no masses palpated. No hepatosplenomegaly. Bowel sounds positive.  Musculoskeletal: no clubbing / cyanosis. No joint deformity upper  and lower extremities. Good ROM, no contractures. Normal muscle tone.  Skin: no rashes, lesions, ulcers. No induration Neurologic: CN 2-12 grossly intact. Sensation intact, DTR normal. Strength 5/5 in all 4.  Psychiatric: Normal judgment and insight. Alert and oriented x 3. Normal mood.     Labs on Admission: I have personally reviewed following labs and imaging studies  CBC:  Recent Labs Lab 05/30/16 1717  WBC 9.5  HGB 12.3  HCT 37.2  MCV 82.5  PLT 258   Basic Metabolic Panel:  Recent Labs Lab 05/30/16 1717  NA 121*  K 4.3  CL 91*  CO2 14*  GLUCOSE 781*  BUN 15  CREATININE 0.97  CALCIUM 9.3   GFR: Estimated Creatinine Clearance: 69.8 mL/min (by C-G formula based on SCr of 0.97 mg/dL). Liver Function Tests:  Recent Labs Lab 05/30/16 1717  AST 19  ALT 17  ALKPHOS 50  BILITOT 1.5*  PROT 7.1  ALBUMIN 3.8    Recent Labs Lab 05/30/16 1717  LIPASE 24   No results for input(s): AMMONIA in the last 168 hours. Coagulation Profile: No results for input(s): INR, PROTIME in the last 168 hours. Cardiac  Enzymes: No results for input(s): CKTOTAL, CKMB, CKMBINDEX, TROPONINI in the last 168 hours. BNP (last 3 results) No results for input(s): PROBNP in the last 8760 hours. HbA1C: No results for input(s): HGBA1C in the last 72 hours. CBG:  Recent Labs Lab 05/30/16 2009 05/30/16 2141  GLUCAP 477* 469*   Lipid Profile: No results for input(s): CHOL, HDL, LDLCALC, TRIG, CHOLHDL, LDLDIRECT in the last 72 hours. Thyroid Function Tests: No results for input(s): TSH, T4TOTAL, FREET4, T3FREE, THYROIDAB in the last 72 hours. Anemia Panel: No results for input(s): VITAMINB12, FOLATE, FERRITIN, TIBC, IRON, RETICCTPCT in the last 72 hours. Urine analysis:    Component Value Date/Time   COLORURINE STRAW (A) 05/30/2016 1654   APPEARANCEUR CLEAR 05/30/2016 1654   APPEARANCEUR Hazy 11/10/2013 2043   LABSPEC 1.026 05/30/2016 1654   LABSPEC 1.031 11/10/2013 2043    PHURINE 5.0 05/30/2016 1654   GLUCOSEU >=500 (A) 05/30/2016 1654   GLUCOSEU >=500 11/10/2013 2043   HGBUR NEGATIVE 05/30/2016 1654   BILIRUBINUR NEGATIVE 05/30/2016 1654   BILIRUBINUR Negative 11/24/2015 1443   BILIRUBINUR Negative 11/10/2013 2043   KETONESUR 20 (A) 05/30/2016 1654   PROTEINUR NEGATIVE 05/30/2016 1654   UROBILINOGEN 0.2 11/24/2015 1443   UROBILINOGEN 1.0 03/12/2015 2039   NITRITE NEGATIVE 05/30/2016 1654   LEUKOCYTESUR NEGATIVE 05/30/2016 1654   LEUKOCYTESUR 1+ 11/10/2013 2043   Sepsis Labs: Recent Results (from the past 240 hour(s))  Wet prep, genital     Status: Abnormal   Collection Time: 05/30/16 10:02 PM  Result Value Ref Range Status   Yeast Wet Prep HPF POC NONE SEEN NONE SEEN Final   Trich, Wet Prep NONE SEEN NONE SEEN Final   Clue Cells Wet Prep HPF POC PRESENT (A) NONE SEEN Final   WBC, Wet Prep HPF POC FEW (A) NONE SEEN Final   Sperm NONE SEEN  Final     Radiological Exams on Admission: US Ob Comp < 14 Wks  Result Date: 05/30/2016 CLINICAL DATA:  26 year old pregnant female with pelvic pain. Estimated gestational age of [redacted] weeks by LMP. Quantitative beta HCG of 641. EXAM: OBSTETRIC <14 WK [place] AND TRANSVAGINAL OB US TECHNIQUE: Both transabdominal and transvaginal ultrasound examinations were performed for complete evaluation of the gestation as well as the maternal uterus, adnexal regions, and pelvic cul-de-sac. Transvaginal technique was performed to assess early pregnancy. COMPARISON:  None. FINDINGS: Intrauterine gestational sac: None Yolk sac:  Not Visualized. Embryo:  Not Visualized. Subchorionic hemorrhage:  None visualized. Maternal uterus/adnexae: No suspicious ovarian abnormalities noted. No adnexal masses. A small amount of free pelvic fluid is noted. IMPRESSION: No intrauterine gestational sac, yolk sac, fetal pole, or cardiac activity visualized. Differential considerations include intrauterine gestation too early to be sonographically visualized,  spontaneous abortion, or ectopic pregnancy. Consider follow-up ultrasound in 10 days and serial quantitative beta HCG follow-up. Small amount of free pelvic fluid which is nonspecific. Electronically Signed   By: Margarette Canada M.D.   On: 05/30/2016 21:13   US Ob Transvaginal  Result Date: 05/30/2016 CLINICAL DATA:  26 year old pregnant female with pelvic pain. Estimated gestational age of [redacted] weeks by LMP. Quantitative beta HCG of 641. EXAM: OBSTETRIC <14 WK [place] AND TRANSVAGINAL OB US TECHNIQUE: Both transabdominal and transvaginal ultrasound examinations were performed for complete evaluation of the gestation as well as the maternal uterus, adnexal regions, and pelvic cul-de-sac. Transvaginal technique was performed to assess early pregnancy. COMPARISON:  None. FINDINGS: Intrauterine gestational sac: None Yolk sac:  Not Visualized. Embryo:  Not Visualized. Subchorionic  hemorrhage:  None visualized. Maternal uterus/adnexae: No suspicious ovarian abnormalities noted. No adnexal masses. A small amount of free pelvic fluid is noted. IMPRESSION: No intrauterine gestational sac, yolk sac, fetal pole, or cardiac activity visualized. Differential considerations include intrauterine gestation too early to be sonographically visualized, spontaneous abortion, or ectopic pregnancy. Consider follow-up ultrasound in 10 days and serial quantitative beta HCG follow-up. Small amount of free pelvic fluid which is nonspecific. Electronically Signed   By: Margarette Canada M.D.   On: 05/30/2016 21:13      Assessment/Plan DKA, type I : Acute. Patient found have a blood sugar 781 on admission with an anion gap of 16. UA with positive ketones. - Admit to stepdown unit  - Glucose stabilizer protocol initiated  - Serial BMPs, hemoglobin A1c in a.m.  - Correct electrolytes as needed - Monitoring for AG closure and will transition to subcutaneous insulin once able - Diabetes education   Diarrhea: Persistent diarrhea over the last 1  week.  - Check C. difficile and GI panel  - Monitor ins and outs - May warrant further imaging studies, but held off due to possible pregnancy   Elevated beta hCG : Acute. Patient notes last menstrual cycle was sometime in November. Presents with elevated beta-hCG level of 600, but transvaginal ultrasound negative for signs of  pregnancy.  - Recheck beta-hCG in a.m. - Consider need of consultation show a week on an inpatient setting/follow-up with OB/GYN in outpatient setting  History of trichomonas infection: Patient just recently finished course of metronidazole within the last week. - Question need of continuation metronidazole  DVT prophylaxis xis: Heparin  Code Status: Full  Family Communication: No family present at bedside  Disposition Plan: Likely discharge home in 2 days Consults called:  None Admission status: Inpatient  Norval Morton MD [facility] Pager [phone]  If 7PM-7AM, please contact night-coverage [email or web address] Password [facility]  05/30/2016, 10:58 PM

## 2016-05-30 NOTE — ED Notes (Addendum)
No diarrhea since 0400 am today

## 2016-05-30 NOTE — ED Notes (Signed)
The pt has been ill for one week with generalized abd pain  And diarrhea.  She has been waking up at night for the past few with sweating  And she has had chills in the past.  lmp was November  She reports that she has irregular periods

## 2016-05-30 NOTE — ED Notes (Signed)
Patient transported to CT 

## 2016-05-31 ENCOUNTER — Encounter (HOSPITAL_COMMUNITY): Payer: Self-pay | Admitting: General Practice

## 2016-05-31 DIAGNOSIS — Z8619 Personal history of other infectious and parasitic diseases: Secondary | ICD-10-CM

## 2016-05-31 DIAGNOSIS — Z3200 Encounter for pregnancy test, result unknown: Secondary | ICD-10-CM

## 2016-05-31 DIAGNOSIS — R197 Diarrhea, unspecified: Secondary | ICD-10-CM | POA: Diagnosis present

## 2016-05-31 DIAGNOSIS — E1010 Type 1 diabetes mellitus with ketoacidosis without coma: Principal | ICD-10-CM

## 2016-05-31 HISTORY — DX: Personal history of other infectious and parasitic diseases: Z86.19

## 2016-05-31 LAB — BASIC METABOLIC PANEL
ANION GAP: 10 (ref 5–15)
Anion gap: 10 (ref 5–15)
Anion gap: 12 (ref 5–15)
Anion gap: 16 — ABNORMAL HIGH (ref 5–15)
Anion gap: 6 (ref 5–15)
Anion gap: 8 (ref 5–15)
Anion gap: 9 (ref 5–15)
BUN: 9 mg/dL (ref 6–20)
BUN: 9 mg/dL (ref 6–20)
BUN: 6 mg/dL (ref 6–20)
BUN: 7 mg/dL (ref 6–20)
BUN: 8 mg/dL (ref 6–20)
BUN: 11 mg/dL (ref 6–20)
BUN: 8 mg/dL (ref 6–20)
CALCIUM: 8.4 mg/dL — AB (ref 8.9–10.3)
CALCIUM: 8.4 mg/dL — AB (ref 8.9–10.3)
CALCIUM: 8.2 mg/dL — AB (ref 8.9–10.3)
CHLORIDE: 109 mmol/L (ref 101–111)
CHLORIDE: 104 mmol/L (ref 101–111)
CHLORIDE: 107 mmol/L (ref 101–111)
CHLORIDE: 109 mmol/L (ref 101–111)
CHLORIDE: 103 mmol/L (ref 101–111)
CO2: 17 mmol/L — AB (ref 22–32)
CO2: 17 mmol/L — ABNORMAL LOW (ref 22–32)
CO2: 17 mmol/L — AB (ref 22–32)
CO2: 19 mmol/L — AB (ref 22–32)
CO2: 19 mmol/L — ABNORMAL LOW (ref 22–32)
CO2: 19 mmol/L — ABNORMAL LOW (ref 22–32)
CO2: 11 mmol/L — AB (ref 22–32)
CREATININE: 0.52 mg/dL (ref 0.44–1.00)
CREATININE: 0.46 mg/dL (ref 0.44–1.00)
CREATININE: 0.46 mg/dL (ref 0.44–1.00)
CREATININE: 0.59 mg/dL (ref 0.44–1.00)
Calcium: 8.3 mg/dL — ABNORMAL LOW (ref 8.9–10.3)
Calcium: 8.4 mg/dL — ABNORMAL LOW (ref 8.9–10.3)
Calcium: 8.6 mg/dL — ABNORMAL LOW (ref 8.9–10.3)
Calcium: 8.8 mg/dL — ABNORMAL LOW (ref 8.9–10.3)
Chloride: 109 mmol/L (ref 101–111)
Chloride: 106 mmol/L (ref 101–111)
Creatinine, Ser: 0.58 mg/dL (ref 0.44–1.00)
Creatinine, Ser: 0.67 mg/dL (ref 0.44–1.00)
Creatinine, Ser: 0.95 mg/dL (ref 0.44–1.00)
GFR calc Af Amer: >60 mL/min (ref >60)
GFR calc Af Amer: >60 mL/min (ref >60)
GFR calc Af Amer: >60 mL/min (ref >60)
GFR calc non Af Amer: >60 mL/min (ref >60)
GFR calc non Af Amer: >60 mL/min (ref >60)
GFR calc non Af Amer: >60 mL/min (ref >60)
GFR calc non Af Amer: >60 mL/min (ref >60)
GFR calc non Af Amer: >60 mL/min (ref >60)
GFR calc non Af Amer: >60 mL/min (ref >60)
GFR calc non Af Amer: >60 mL/min (ref >60)
Glucose, Bld: 123 mg/dL — ABNORMAL HIGH (ref 65–99)
Glucose, Bld: 153 mg/dL — ABNORMAL HIGH (ref 65–99)
Glucose, Bld: 177 mg/dL — ABNORMAL HIGH (ref 65–99)
Glucose, Bld: 190 mg/dL — ABNORMAL HIGH (ref 65–99)
Glucose, Bld: 200 mg/dL — ABNORMAL HIGH (ref 65–99)
Glucose, Bld: 241 mg/dL — ABNORMAL HIGH (ref 65–99)
Glucose, Bld: 507 mg/dL (ref 65–99)
Potassium: 3.5 mmol/L (ref 3.5–5.1)
Potassium: 3.6 mmol/L (ref 3.5–5.1)
Potassium: 3.6 mmol/L (ref 3.5–5.1)
Potassium: 3.7 mmol/L (ref 3.5–5.1)
Potassium: 3.8 mmol/L (ref 3.5–5.1)
Potassium: 3.8 mmol/L (ref 3.5–5.1)
Potassium: 3.9 mmol/L (ref 3.5–5.1)
Sodium: 130 mmol/L — ABNORMAL LOW (ref 135–145)
Sodium: 133 mmol/L — ABNORMAL LOW (ref 135–145)
Sodium: 134 mmol/L — ABNORMAL LOW (ref 135–145)
Sodium: 134 mmol/L — ABNORMAL LOW (ref 135–145)
Sodium: 135 mmol/L (ref 135–145)
Sodium: 135 mmol/L (ref 135–145)
Sodium: 136 mmol/L (ref 135–145)

## 2016-05-31 LAB — CBC
HEMATOCRIT: 34.8 % — AB (ref 36.0–46.0)
HEMOGLOBIN: 11.7 g/dL — AB (ref 12.0–15.0)
MCH: 27.1 pg (ref 26.0–34.0)
MCHC: 33.6 g/dL (ref 30.0–36.0)
MCV: 80.6 fL (ref 78.0–100.0)
Platelets: 280 10*3/uL (ref 150–400)
RBC: 4.32 MIL/uL (ref 3.87–5.11)
RDW: 12.8 % (ref 11.5–15.5)
WBC: 10.2 10*3/uL (ref 4.0–10.5)

## 2016-05-31 LAB — ABO/RH: ABO/RH(D): A POS

## 2016-05-31 LAB — GLUCOSE, CAPILLARY
GLUCOSE-CAPILLARY: 213 mg/dL — AB (ref 65–99)
GLUCOSE-CAPILLARY: 84 mg/dL (ref 65–99)
GLUCOSE-CAPILLARY: 190 mg/dL — AB (ref 65–99)
GLUCOSE-CAPILLARY: 198 mg/dL — AB (ref 65–99)
GLUCOSE-CAPILLARY: 142 mg/dL — AB (ref 65–99)
GLUCOSE-CAPILLARY: 194 mg/dL — AB (ref 65–99)
GLUCOSE-CAPILLARY: 241 mg/dL — AB (ref 65–99)
Glucose-Capillary: 130 mg/dL — ABNORMAL HIGH (ref 65–99)
Glucose-Capillary: 139 mg/dL — ABNORMAL HIGH (ref 65–99)
Glucose-Capillary: 147 mg/dL — ABNORMAL HIGH (ref 65–99)
Glucose-Capillary: 166 mg/dL — ABNORMAL HIGH (ref 65–99)
Glucose-Capillary: 167 mg/dL — ABNORMAL HIGH (ref 65–99)
Glucose-Capillary: 167 mg/dL — ABNORMAL HIGH (ref 65–99)
Glucose-Capillary: 168 mg/dL — ABNORMAL HIGH (ref 65–99)
Glucose-Capillary: 226 mg/dL — ABNORMAL HIGH (ref 65–99)
Glucose-Capillary: 233 mg/dL — ABNORMAL HIGH (ref 65–99)
Glucose-Capillary: 233 mg/dL — ABNORMAL HIGH (ref 65–99)
Glucose-Capillary: 360 mg/dL — ABNORMAL HIGH (ref 65–99)
Glucose-Capillary: 91 mg/dL (ref 65–99)

## 2016-05-31 LAB — HCG, QUANTITATIVE, PREGNANCY: hCG, Beta Chain, Quant, S: 658 m[IU]/mL — ABNORMAL HIGH (ref <5)

## 2016-05-31 LAB — GC/CHLAMYDIA PROBE AMP (~~LOC~~) NOT AT ARMC
Chlamydia: NEGATIVE
NEISSERIA GONORRHEA: NEGATIVE

## 2016-05-31 LAB — RPR: RPR: NONREACTIVE

## 2016-05-31 LAB — MRSA PCR SCREENING: MRSA by PCR: NEGATIVE

## 2016-05-31 LAB — HIV ANTIBODY (ROUTINE TESTING W REFLEX): HIV Screen 4th Generation wRfx: NONREACTIVE

## 2016-05-31 NOTE — Progress Notes (Signed)
CBG 130 stabilizer advices 0.0 cc/hr on insulin drip.  Insulin infusion left to infuse at current rate  0.3cc/hr

## 2016-05-31 NOTE — Progress Notes (Signed)
Inpatient Diabetes Program Recommendations  AACE/ADA: New Consensus Statement on Inpatient Glycemic Control (2015)  Target Ranges:  Prepandial:   less than 140 mg/dL      Peak postprandial:   less than 180 mg/dL (1-2 hours)      Critically ill patients:  140 - 180 mg/dL   Lab Results  Component Value Date   GLUCAP 226 (H) 05/31/2016   HGBA1C >14.0 (H) 01/16/2016    Review of Glycemic Control Results for Ashley Freeman, Ashley Freeman (MRN [id number]) as of 05/31/2016 11:37  Ref. Range 05/31/2016 04:03 05/31/2016 05:08 05/31/2016 06:12 05/31/2016 07:27 05/31/2016 09:37  Glucose-Capillary Latest Ref Range: 65 - 99 mg/dL 167 (H) 84 91 130 (H) 226 (H)   Diabetes history: DM1 Outpatient Diabetes medications: Lantus 25 units bid + Novolog 0-12 units tid Current orders for Inpatient glycemic control: IV insulin drip per glucostabilizer  Inpatient Diabetes Program Recommendations:  Spoke with patient @ bedside. Patient states she missed her appt @ the [facility] in December and ran out of insulin 2 days ago. When ran out of insulin, called for prescription and waited 48 hrs for prescription to be filled. Reviewed with patient importance of keeping appts and rescheduling appts. If she happens to miss and call office ahead of time prior to needing insulin. Patient states she now has Lantus and Novolog insulin @ home to use @ discharge home. Reviewed with patient concerning insulin regimen. Patient states she has tried to learn carbohydrate counting and take her insulin according. Patient states "I don't want to count carbs". Reviewed insulin needs based on nutrition but patient does not show interest in learning @ this time.  When patient meets criteria for D/C of insulin drip, please give Lantus 2 hrs prior to drip discontinued and cover CBG @ time of discontinuation of drip. Last seen by Diabetes Coordinator 11/25/15. For transition from IV insulin, please consider: Lantus 20 units bid Novolog 2-4 units  tid meal coverage if eats 50% Novolog correction 0-9 units tid + 0-5 units hs  Thank you, Bethena Roys E. Jaxtyn Linville, RN, MSN, CDE Inpatient Glycemic Control Team Team Pager [phone] (8am-5pm) 05/31/2016 11:52 AM

## 2016-05-31 NOTE — ED Notes (Signed)
Report given to rn [email or web address] 

## 2016-05-31 NOTE — Progress Notes (Signed)
PROGRESS NOTE    HALEY FUERSTENBERG  CBU:[id number] DOB: [birth year] DOA: 05/30/2016 PCP: Arnoldo Morale, MD   Brief Narrative:  26 y.o. female with medical history significant of type 1 diabetes who presented in DKA. Was complaining of abdominal discomfort.  Assessment & Plan:   Principal Problem:   DKA (diabetic ketoacidoses) ([place]) - Once acidosis resolves will place on home SQ insulin regimen with SSI and night coverage  Active Problems:   Possible pregnancy - HCG up but transvaginal U/S reports: No intrauterine gestational sac, yolk sac, fetal pole, or cardiac activity visualized    History of trichomoniasis - treated per h and p prior to admission    Diarrhea - resolved  DVT prophylaxis: Heparin Code Status: Full Family Communication: d/c patient directly Disposition Plan: stepdown while on the drip   Consultants:   None   Procedures: None   Antimicrobials: None   Subjective: Pt has no new complaints.  Objective: Vitals:   05/31/16 0500 05/31/16 0700 05/31/16 0800 05/31/16 1154  BP: 99/68 95/63 105/75 109/75  Pulse: (!) 102 (!) 101 98 (!) 115  Resp: 16 14 18 13   Temp: 98.6 F (37 C)   98.3 F (36.8 C)  TempSrc: Oral   Oral  SpO2: 100% 99% 100% 99%  Weight:      Height:        Intake/Output Summary (Last 24 hours) at 05/31/16 1446 Last data filed at 05/31/16 0800  Gross per 24 hour  Intake          3027.14 ml  Output             1950 ml  Net          1077.14 ml   Filed Weights   05/30/16 1646 05/31/16 0050  Weight: 49 kg (108 lb) 53.4 kg (117 lb 11.6 oz)    Examination:  General exam: Appears calm and comfortable, in nad. Respiratory system: Clear to auscultation. Respiratory effort normal. Equal chest rise. Cardiovascular system: S1 & S2 heard, RRR. No JVD, murmurs, rubs, gallops or clicks. No pedal edema. Gastrointestinal system: Abdomen is nondistended, soft and nontender. No organomegaly or masses felt. Normal bowel sounds  heard. Central nervous system: Alert and oriented. No focal neurological deficits. Extremities: Symmetric 5 x 5 power,  Skin: No rashes, lesions or ulcers, on limited exam. Psychiatry: Judgement and insight appear normal. Mood & affect appropriate.   Data Reviewed: I have personally reviewed following labs and imaging studies  CBC:  Recent Labs Lab 05/30/16 1717 05/31/16 0319  WBC 9.5 10.2  HGB 12.3 11.7*  HCT 37.2 34.8*  MCV 82.5 80.6  PLT 293 032   Basic Metabolic Panel:  Recent Labs Lab 05/30/16 2352 05/31/16 0319 05/31/16 0708 05/31/16 1131 05/31/16 1327  NA 130* 136 135 134* 133*  K 3.9 3.6 3.8 3.7 3.8  CL 103 109 109 107 104  CO2 11* 17* 17* 19* 19*  GLUCOSE 507* 190* 123* 153* 177*  BUN 11 9 9 8 7   CREATININE 0.95 0.67 0.58 0.52 0.46  CALCIUM 8.6* 8.8* 8.4* 8.4* 8.4*   GFR: Estimated Creatinine Clearance: 90.5 mL/min (by C-G formula based on SCr of 0.46 mg/dL). Liver Function Tests:  Recent Labs Lab 05/30/16 1717  AST 19  ALT 17  ALKPHOS 50  BILITOT 1.5*  PROT 7.1  ALBUMIN 3.8    Recent Labs Lab 05/30/16 1717  LIPASE 24   No results for input(s): AMMONIA in the last 168 hours. Coagulation Profile: No  results for input(s): INR, PROTIME in the last 168 hours. Cardiac Enzymes: No results for input(s): CKTOTAL, CKMB, CKMBINDEX, TROPONINI in the last 168 hours. BNP (last 3 results) No results for input(s): PROBNP in the last 8760 hours. HbA1C: No results for input(s): HGBA1C in the last 72 hours. CBG:  Recent Labs Lab 05/31/16 0937 05/31/16 1044 05/31/16 1152 05/31/16 1312 05/31/16 1418  GLUCAP 226* 190* 166* 168* 198*   Lipid Profile: No results for input(s): CHOL, HDL, LDLCALC, TRIG, CHOLHDL, LDLDIRECT in the last 72 hours. Thyroid Function Tests: No results for input(s): TSH, T4TOTAL, FREET4, T3FREE, THYROIDAB in the last 72 hours. Anemia Panel: No results for input(s): VITAMINB12, FOLATE, FERRITIN, TIBC, IRON, RETICCTPCT in the  last 72 hours. Sepsis Labs: No results for input(s): PROCALCITON, LATICACIDVEN in the last 168 hours.  Recent Results (from the past 240 hour(s))  Wet prep, genital     Status: Abnormal   Collection Time: 05/30/16 10:02 PM  Result Value Ref Range Status   Yeast Wet Prep HPF POC NONE SEEN NONE SEEN Final   Trich, Wet Prep NONE SEEN NONE SEEN Final   Clue Cells Wet Prep HPF POC PRESENT (A) NONE SEEN Final   WBC, Wet Prep HPF POC FEW (A) NONE SEEN Final   Sperm NONE SEEN  Final  MRSA PCR Screening     Status: None   Collection Time: 05/31/16 12:51 AM  Result Value Ref Range Status   MRSA by PCR NEGATIVE NEGATIVE Final    Comment:        The GeneXpert MRSA Assay (FDA approved for NASAL specimens only), is one component of a comprehensive MRSA colonization surveillance program. It is not intended to diagnose MRSA infection nor to guide or monitor treatment for MRSA infections.      Radiology Studies: US Ob Comp < 14 Wks  Result Date: 05/30/2016 CLINICAL DATA:  26 year old pregnant female with pelvic pain. Estimated gestational age of [redacted] weeks by LMP. Quantitative beta HCG of 641. EXAM: OBSTETRIC <14 WK [place] AND TRANSVAGINAL OB US TECHNIQUE: Both transabdominal and transvaginal ultrasound examinations were performed for complete evaluation of the gestation as well as the maternal uterus, adnexal regions, and pelvic cul-de-sac. Transvaginal technique was performed to assess early pregnancy. COMPARISON:  None. FINDINGS: Intrauterine gestational sac: None Yolk sac:  Not Visualized. Embryo:  Not Visualized. Subchorionic hemorrhage:  None visualized. Maternal uterus/adnexae: No suspicious ovarian abnormalities noted. No adnexal masses. A small amount of free pelvic fluid is noted. IMPRESSION: No intrauterine gestational sac, yolk sac, fetal pole, or cardiac activity visualized. Differential considerations include intrauterine gestation too early to be sonographically visualized, spontaneous  abortion, or ectopic pregnancy. Consider follow-up ultrasound in 10 days and serial quantitative beta HCG follow-up. Small amount of free pelvic fluid which is nonspecific. Electronically Signed   By: Margarette Canada M.D.   On: 05/30/2016 21:13   US Ob Transvaginal  Result Date: 05/30/2016 CLINICAL DATA:  26 year old pregnant female with pelvic pain. Estimated gestational age of [redacted] weeks by LMP. Quantitative beta HCG of 641. EXAM: OBSTETRIC <14 WK [place] AND TRANSVAGINAL OB US TECHNIQUE: Both transabdominal and transvaginal ultrasound examinations were performed for complete evaluation of the gestation as well as the maternal uterus, adnexal regions, and pelvic cul-de-sac. Transvaginal technique was performed to assess early pregnancy. COMPARISON:  None. FINDINGS: Intrauterine gestational sac: None Yolk sac:  Not Visualized. Embryo:  Not Visualized. Subchorionic hemorrhage:  None visualized. Maternal uterus/adnexae: No suspicious ovarian abnormalities noted. No adnexal masses. A small amount  of free pelvic fluid is noted. IMPRESSION: No intrauterine gestational sac, yolk sac, fetal pole, or cardiac activity visualized. Differential considerations include intrauterine gestation too early to be sonographically visualized, spontaneous abortion, or ectopic pregnancy. Consider follow-up ultrasound in 10 days and serial quantitative beta HCG follow-up. Small amount of free pelvic fluid which is nonspecific. Electronically Signed   By: Margarette Canada M.D.   On: 05/30/2016 21:13    Scheduled Meds: . heparin  5,000 Units Subcutaneous Q8H  . insulin regular  0-10 Units Intravenous TID WC  . sodium chloride flush  3 mL Intravenous Q12H   Continuous Infusions: . sodium chloride Stopped (05/31/16 0152)  . dextrose 5 % and 0.45% NaCl 125 mL/hr at 05/31/16 0939  . insulin (NOVOLIN-R) infusion 4.1 Units/hr (05/31/16 1427)     LOS: 1 day    Time spent: > 35 minutes  Velvet Bathe, MD [facility] Pager [phone]  If 7PM-7AM, please contact night-coverage [email or web address] Password [email or web address] 05/31/2016, 2:46 PM

## 2016-05-31 NOTE — Progress Notes (Signed)
Lamar Blinks, NP notified of anion Gap of 10 and CBG's within target range. Per NP will await normalization of CO2 before discontinuing insulin gtt. Orders placed for another BMET. Will continue to monitor.

## 2016-06-01 DIAGNOSIS — E876 Hypokalemia: Secondary | ICD-10-CM

## 2016-06-01 DIAGNOSIS — Z8619 Personal history of other infectious and parasitic diseases: Secondary | ICD-10-CM

## 2016-06-01 LAB — GASTROINTESTINAL PANEL BY PCR, STOOL (REPLACES STOOL CULTURE)
ADENOVIRUS F40/41: NOT DETECTED
Astrovirus: NOT DETECTED
CRYPTOSPORIDIUM: NOT DETECTED
CYCLOSPORA CAYETANENSIS: NOT DETECTED
Campylobacter species: NOT DETECTED
Entamoeba histolytica: NOT DETECTED
Enteroaggregative E coli (EAEC): NOT DETECTED
Enteropathogenic E coli (EPEC): NOT DETECTED
Enterotoxigenic E coli (ETEC): NOT DETECTED
Giardia lamblia: NOT DETECTED
Norovirus GI/GII: NOT DETECTED
Plesimonas shigelloides: NOT DETECTED
Rotavirus A: NOT DETECTED
SAPOVIRUS (I, II, IV, AND V): NOT DETECTED
SHIGA LIKE TOXIN PRODUCING E COLI (STEC): NOT DETECTED
Salmonella species: NOT DETECTED
Shigella/Enteroinvasive E coli (EIEC): NOT DETECTED
VIBRIO CHOLERAE: NOT DETECTED
VIBRIO SPECIES: NOT DETECTED
YERSINIA ENTEROCOLITICA: NOT DETECTED

## 2016-06-01 LAB — BASIC METABOLIC PANEL
ANION GAP: 4 — AB (ref 5–15)
ANION GAP: 11 (ref 5–15)
ANION GAP: 11 (ref 5–15)
ANION GAP: 8 (ref 5–15)
Anion gap: 8 (ref 5–15)
BUN: 7 mg/dL (ref 6–20)
BUN: <5 mg/dL — ABNORMAL LOW (ref 6–20)
BUN: 6 mg/dL (ref 6–20)
BUN: 7 mg/dL (ref 6–20)
CALCIUM: 8.2 mg/dL — AB (ref 8.9–10.3)
CALCIUM: 8.1 mg/dL — AB (ref 8.9–10.3)
CHLORIDE: 103 mmol/L (ref 101–111)
CO2: 18 mmol/L — AB (ref 22–32)
CO2: 20 mmol/L — ABNORMAL LOW (ref 22–32)
CO2: 19 mmol/L — ABNORMAL LOW (ref 22–32)
CO2: 18 mmol/L — ABNORMAL LOW (ref 22–32)
CO2: 21 mmol/L — ABNORMAL LOW (ref 22–32)
CREATININE: 0.56 mg/dL (ref 0.44–1.00)
Calcium: 8.6 mg/dL — ABNORMAL LOW (ref 8.9–10.3)
Calcium: 8.7 mg/dL — ABNORMAL LOW (ref 8.9–10.3)
Calcium: 8.9 mg/dL (ref 8.9–10.3)
Chloride: 109 mmol/L (ref 101–111)
Chloride: 110 mmol/L (ref 101–111)
Chloride: 106 mmol/L (ref 101–111)
Chloride: 105 mmol/L (ref 101–111)
Creatinine, Ser: 0.56 mg/dL (ref 0.44–1.00)
Creatinine, Ser: 0.5 mg/dL (ref 0.44–1.00)
Creatinine, Ser: 0.71 mg/dL (ref 0.44–1.00)
Creatinine, Ser: 0.67 mg/dL (ref 0.44–1.00)
GFR calc Af Amer: >60 mL/min (ref >60)
GFR calc Af Amer: >60 mL/min (ref >60)
GFR calc Af Amer: >60 mL/min (ref >60)
GFR calc Af Amer: >60 mL/min (ref >60)
GFR calc Af Amer: >60 mL/min (ref >60)
GFR calc non Af Amer: >60 mL/min (ref >60)
GFR calc non Af Amer: >60 mL/min (ref >60)
GFR calc non Af Amer: >60 mL/min (ref >60)
GFR calc non Af Amer: >60 mL/min (ref >60)
GFR calc non Af Amer: >60 mL/min (ref >60)
GLUCOSE: 240 mg/dL — AB (ref 65–99)
GLUCOSE: 164 mg/dL — AB (ref 65–99)
GLUCOSE: 240 mg/dL — AB (ref 65–99)
GLUCOSE: 364 mg/dL — AB (ref 65–99)
Glucose, Bld: 146 mg/dL — ABNORMAL HIGH (ref 65–99)
POTASSIUM: 3.7 mmol/L (ref 3.5–5.1)
POTASSIUM: 4 mmol/L (ref 3.5–5.1)
POTASSIUM: 4.1 mmol/L (ref 3.5–5.1)
POTASSIUM: 4.7 mmol/L (ref 3.5–5.1)
Potassium: 3.4 mmol/L — ABNORMAL LOW (ref 3.5–5.1)
Sodium: 135 mmol/L (ref 135–145)
Sodium: 134 mmol/L — ABNORMAL LOW (ref 135–145)
Sodium: 136 mmol/L (ref 135–145)
Sodium: 134 mmol/L — ABNORMAL LOW (ref 135–145)
Sodium: 132 mmol/L — ABNORMAL LOW (ref 135–145)

## 2016-06-01 LAB — GLUCOSE, CAPILLARY
GLUCOSE-CAPILLARY: 136 mg/dL — AB (ref 65–99)
GLUCOSE-CAPILLARY: 138 mg/dL — AB (ref 65–99)
GLUCOSE-CAPILLARY: 162 mg/dL — AB (ref 65–99)
GLUCOSE-CAPILLARY: 170 mg/dL — AB (ref 65–99)
GLUCOSE-CAPILLARY: 213 mg/dL — AB (ref 65–99)
GLUCOSE-CAPILLARY: 235 mg/dL — AB (ref 65–99)
GLUCOSE-CAPILLARY: 245 mg/dL — AB (ref 65–99)
Glucose-Capillary: 107 mg/dL — ABNORMAL HIGH (ref 65–99)
Glucose-Capillary: 139 mg/dL — ABNORMAL HIGH (ref 65–99)
Glucose-Capillary: 148 mg/dL — ABNORMAL HIGH (ref 65–99)
Glucose-Capillary: 174 mg/dL — ABNORMAL HIGH (ref 65–99)
Glucose-Capillary: 214 mg/dL — ABNORMAL HIGH (ref 65–99)
Glucose-Capillary: 400 mg/dL — ABNORMAL HIGH (ref 65–99)

## 2016-06-01 LAB — C DIFFICILE QUICK SCREEN W PCR REFLEX
C DIFFICILE (CDIFF) INTERP: NOT DETECTED
C DIFFICILE (CDIFF) TOXIN: NEGATIVE
C DIFFICLE (CDIFF) ANTIGEN: NEGATIVE

## 2016-06-01 MED ORDER — SODIUM CHLORIDE 0.9 % IV SOLN
30.0000 meq | Freq: Once | INTRAVENOUS | Status: AC
  Administered 2016-06-01: 30 meq via INTRAVENOUS
  Filled 2016-06-01: qty 15

## 2016-06-01 MED ORDER — INSULIN ASPART 100 UNIT/ML ~~LOC~~ SOLN
0.0000 [IU] | Freq: Three times a day (TID) | SUBCUTANEOUS | Status: DC
  Administered 2016-06-01 (×2): 3 [IU] via SUBCUTANEOUS
  Administered 2016-06-02: 5 [IU] via SUBCUTANEOUS
  Administered 2016-06-02: 9 [IU] via SUBCUTANEOUS
  Administered 2016-06-02: 3 [IU] via SUBCUTANEOUS
  Administered 2016-06-03 (×2): 2 [IU] via SUBCUTANEOUS

## 2016-06-01 MED ORDER — INSULIN ASPART 100 UNIT/ML ~~LOC~~ SOLN
0.0000 [IU] | Freq: Every day | SUBCUTANEOUS | Status: DC
  Administered 2016-06-01: 5 [IU] via SUBCUTANEOUS

## 2016-06-01 MED ORDER — INSULIN GLARGINE 100 UNIT/ML ~~LOC~~ SOLN
25.0000 [IU] | Freq: Every day | SUBCUTANEOUS | Status: DC
  Administered 2016-06-01 – 2016-06-02 (×2): 25 [IU] via SUBCUTANEOUS
  Filled 2016-06-01 (×2): qty 0.25

## 2016-06-01 NOTE — Progress Notes (Signed)
PROGRESS NOTE    Ashley Freeman  OJJ:[id number] DOB: 03-01-1991 DOA: 05/30/2016 PCP: Arnoldo Morale, MD   Brief Narrative:  26 y.o. female with medical history significant of type 1 diabetes who presented in DKA. Was complaining of abdominal discomfort.  Assessment & Plan:   Principal Problem:   DKA (diabetic ketoacidoses) ([place]) - Will transition to SQ insulin. Patient denies any nausea as such will try to transition to SQ insulin. Patient agreeable   - diabetic diet - transfer to floor with improvement - continue BMP for 2 more draws - Once patient is off insulin gtt discontinue d5 infusion (discussed with nursing)  Active Problems:   Possible pregnancy - HCG up but transvaginal U/S reports: No intrauterine gestational sac, yolk sac, fetal pole, or cardiac activity visualized    History of trichomoniasis - treated per h and p prior to admission    Diarrhea - resolved  Hypokalemia - Replace IV  DVT prophylaxis: Heparin Code Status: Full Family Communication: d/c patient directly Disposition Plan: stepdown while on the drip but if successfully transitioned off will transfer to floor   Consultants:   None   Procedures: None   Antimicrobials: None   Subjective: Pt denies any nausea  Objective: Vitals:   05/31/16 2013 05/31/16 2332 06/01/16 0433 06/01/16 0809  BP: 121/77 116/83 113/76 120/84  Pulse: (!) 115 (!) 111 (!) 109 (!) 112  Resp: (!) 24 19 15  (!) 22  Temp: 99.2 F (37.3 C) 99 F (37.2 C) 98.9 F (37.2 C) 98.2 F (36.8 C)  TempSrc: Oral Oral Oral Oral  SpO2: 99% 98% 99% 100%  Weight:      Height:        Intake/Output Summary (Last 24 hours) at 06/01/16 1014 Last data filed at 06/01/16 9371  Gross per 24 hour  Intake             2485 ml  Output              400 ml  Net             2085 ml   Filed Weights   05/30/16 1646 05/31/16 0050  Weight: 49 kg (108 lb) 53.4 kg (117 lb 11.6 oz)    Examination:  General exam: Appears calm  and comfortable, in nad. Respiratory system: Clear to auscultation. Respiratory effort normal. Equal chest rise. Cardiovascular system: S1 & S2 heard, RRR. No JVD, murmurs, rubs, gallops or clicks. No pedal edema. Gastrointestinal system: Abdomen is nondistended, soft and nontender. No organomegaly or masses felt. Normal bowel sounds heard. Central nervous system: Alert and oriented. No focal neurological deficits. Extremities: Symmetric 5 x 5 power,  Skin: No rashes, lesions or ulcers, on limited exam. Psychiatry: Judgement and insight appear normal. Mood & affect appropriate.   Data Reviewed: I have personally reviewed following labs and imaging studies  CBC:  Recent Labs Lab 05/30/16 1717 05/31/16 0319  WBC 9.5 10.2  HGB 12.3 11.7*  HCT 37.2 34.8*  MCV 82.5 80.6  PLT 293 696   Basic Metabolic Panel:  Recent Labs Lab 05/31/16 1131 05/31/16 1327 05/31/16 1814 05/31/16 2214 06/01/16 0230  NA 134* 133* 134* 135 135  K 3.7 3.8 3.6 3.5 3.4*  CL 107 104 109 106 109  CO2 19* 19* 19* 17* 18*  GLUCOSE 153* 177* 200* 241* 146*  BUN 8 7 6 8 7   CREATININE 0.52 0.46 0.46 0.59 0.56  CALCIUM 8.4* 8.4* 8.2* 8.3* 8.2*   GFR: Estimated Creatinine  Clearance: 90.5 mL/min (by C-G formula based on SCr of 0.56 mg/dL). Liver Function Tests:  Recent Labs Lab 05/30/16 1717  AST 19  ALT 17  ALKPHOS 50  BILITOT 1.5*  PROT 7.1  ALBUMIN 3.8    Recent Labs Lab 05/30/16 1717  LIPASE 24   No results for input(s): AMMONIA in the last 168 hours. Coagulation Profile: No results for input(s): INR, PROTIME in the last 168 hours. Cardiac Enzymes: No results for input(s): CKTOTAL, CKMB, CKMBINDEX, TROPONINI in the last 168 hours. BNP (last 3 results) No results for input(s): PROBNP in the last 8760 hours. HbA1C: No results for input(s): HGBA1C in the last 72 hours. CBG:  Recent Labs Lab 06/01/16 0440 06/01/16 0549 06/01/16 0658 06/01/16 0806 06/01/16 0918  GLUCAP 174* 214*  138* 162* 170*   Lipid Profile: No results for input(s): CHOL, HDL, LDLCALC, TRIG, CHOLHDL, LDLDIRECT in the last 72 hours. Thyroid Function Tests: No results for input(s): TSH, T4TOTAL, FREET4, T3FREE, THYROIDAB in the last 72 hours. Anemia Panel: No results for input(s): VITAMINB12, FOLATE, FERRITIN, TIBC, IRON, RETICCTPCT in the last 72 hours. Sepsis Labs: No results for input(s): PROCALCITON, LATICACIDVEN in the last 168 hours.  Recent Results (from the past 240 hour(s))  Wet prep, genital     Status: Abnormal   Collection Time: 05/30/16 10:02 PM  Result Value Ref Range Status   Yeast Wet Prep HPF POC NONE SEEN NONE SEEN Final   Trich, Wet Prep NONE SEEN NONE SEEN Final   Clue Cells Wet Prep HPF POC PRESENT (A) NONE SEEN Final   WBC, Wet Prep HPF POC FEW (A) NONE SEEN Final   Sperm NONE SEEN  Final  MRSA PCR Screening     Status: None   Collection Time: 05/31/16 12:51 AM  Result Value Ref Range Status   MRSA by PCR NEGATIVE NEGATIVE Final    Comment:        The GeneXpert MRSA Assay (FDA approved for NASAL specimens only), is one component of a comprehensive MRSA colonization surveillance program. It is not intended to diagnose MRSA infection nor to guide or monitor treatment for MRSA infections.   C difficile quick scan w PCR reflex     Status: None   Collection Time: 06/01/16  6:07 AM  Result Value Ref Range Status   C Diff antigen NEGATIVE NEGATIVE Final   C Diff toxin NEGATIVE NEGATIVE Final   C Diff interpretation No C. difficile detected.  Final     Radiology Studies: US Ob Comp < 14 Wks  Result Date: 05/30/2016 CLINICAL DATA:  26 year old pregnant female with pelvic pain. Estimated gestational age of [redacted] weeks by LMP. Quantitative beta HCG of 641. EXAM: OBSTETRIC <14 WK [place] AND TRANSVAGINAL OB US TECHNIQUE: Both transabdominal and transvaginal ultrasound examinations were performed for complete evaluation of the gestation as well as the maternal uterus, adnexal  regions, and pelvic cul-de-sac. Transvaginal technique was performed to assess early pregnancy. COMPARISON:  None. FINDINGS: Intrauterine gestational sac: None Yolk sac:  Not Visualized. Embryo:  Not Visualized. Subchorionic hemorrhage:  None visualized. Maternal uterus/adnexae: No suspicious ovarian abnormalities noted. No adnexal masses. A small amount of free pelvic fluid is noted. IMPRESSION: No intrauterine gestational sac, yolk sac, fetal pole, or cardiac activity visualized. Differential considerations include intrauterine gestation too early to be sonographically visualized, spontaneous abortion, or ectopic pregnancy. Consider follow-up ultrasound in 10 days and serial quantitative beta HCG follow-up. Small amount of free pelvic fluid which is nonspecific. Electronically Signed  By: Margarette Canada M.D.   On: 05/30/2016 21:13   US Ob Transvaginal  Result Date: 05/30/2016 CLINICAL DATA:  26 year old pregnant female with pelvic pain. Estimated gestational age of [redacted] weeks by LMP. Quantitative beta HCG of 641. EXAM: OBSTETRIC <14 WK [place] AND TRANSVAGINAL OB US TECHNIQUE: Both transabdominal and transvaginal ultrasound examinations were performed for complete evaluation of the gestation as well as the maternal uterus, adnexal regions, and pelvic cul-de-sac. Transvaginal technique was performed to assess early pregnancy. COMPARISON:  None. FINDINGS: Intrauterine gestational sac: None Yolk sac:  Not Visualized. Embryo:  Not Visualized. Subchorionic hemorrhage:  None visualized. Maternal uterus/adnexae: No suspicious ovarian abnormalities noted. No adnexal masses. A small amount of free pelvic fluid is noted. IMPRESSION: No intrauterine gestational sac, yolk sac, fetal pole, or cardiac activity visualized. Differential considerations include intrauterine gestation too early to be sonographically visualized, spontaneous abortion, or ectopic pregnancy. Consider follow-up ultrasound in 10 days and serial quantitative  beta HCG follow-up. Small amount of free pelvic fluid which is nonspecific. Electronically Signed   By: Margarette Canada M.D.   On: 05/30/2016 21:13    Scheduled Meds: . heparin  5,000 Units Subcutaneous Q8H  . insulin aspart  0-5 Units Subcutaneous QHS  . insulin aspart  0-9 Units Subcutaneous TID WC  . insulin glargine  25 Units Subcutaneous Daily  . insulin regular  0-10 Units Intravenous TID WC  . potassium chloride (KCL MULTIRUN) 30 mEq in 265 mL IVPB  30 mEq Intravenous Once  . sodium chloride flush  3 mL Intravenous Q12H   Continuous Infusions: . sodium chloride Stopped (05/31/16 0152)  . dextrose 5 % and 0.45% NaCl 125 mL/hr at 06/01/16 0921     LOS: 2 days    Time spent: > 35 minutes  Velvet Bathe, MD [facility] Pager [phone]  If 7PM-7AM, please contact night-coverage [email or web address] Password [email or web address] 06/01/2016, 10:14 AM

## 2016-06-02 LAB — BASIC METABOLIC PANEL
Anion gap: 8 (ref 5–15)
BUN: 7 mg/dL (ref 6–20)
CO2: 21 mmol/L — ABNORMAL LOW (ref 22–32)
Calcium: 9 mg/dL (ref 8.9–10.3)
Chloride: 105 mmol/L (ref 101–111)
Creatinine, Ser: 0.58 mg/dL (ref 0.44–1.00)
GFR calc Af Amer: >60 mL/min (ref >60)
GFR calc non Af Amer: >60 mL/min (ref >60)
Glucose, Bld: 245 mg/dL — ABNORMAL HIGH (ref 65–99)
Potassium: 3.7 mmol/L (ref 3.5–5.1)
Sodium: 134 mmol/L — ABNORMAL LOW (ref 135–145)

## 2016-06-02 LAB — GLUCOSE, CAPILLARY
GLUCOSE-CAPILLARY: 203 mg/dL — AB (ref 65–99)
Glucose-Capillary: 394 mg/dL — ABNORMAL HIGH (ref 65–99)
Glucose-Capillary: 273 mg/dL — ABNORMAL HIGH (ref 65–99)
Glucose-Capillary: 154 mg/dL — ABNORMAL HIGH (ref 65–99)

## 2016-06-02 MED ORDER — SODIUM CHLORIDE 0.9 % IV BOLUS (SEPSIS)
1000.0000 mL | Freq: Once | INTRAVENOUS | Status: AC
  Administered 2016-06-02: 1000 mL via INTRAVENOUS

## 2016-06-02 MED ORDER — INSULIN GLARGINE 100 UNIT/ML ~~LOC~~ SOLN
7.0000 [IU] | Freq: Once | SUBCUTANEOUS | Status: AC
  Administered 2016-06-02: 7 [IU] via SUBCUTANEOUS
  Filled 2016-06-02 (×2): qty 0.07

## 2016-06-02 MED ORDER — INSULIN GLARGINE 100 UNIT/ML ~~LOC~~ SOLN
32.0000 [IU] | Freq: Every day | SUBCUTANEOUS | Status: DC
  Administered 2016-06-03: 32 [IU] via SUBCUTANEOUS
  Filled 2016-06-02: qty 0.32

## 2016-06-02 NOTE — Progress Notes (Signed)
PROGRESS NOTE    Ashley Freeman  FTD:[id number] DOB: 08-21-1990 DOA: 05/30/2016 PCP: Arnoldo Morale, MD   Brief Narrative:  26 y.o. female with medical history significant of type 1 diabetes who presented in DKA. Was complaining of abdominal discomfort.  Assessment & Plan:   Principal Problem:   DKA (diabetic ketoacidoses) ([place]) - Transfer to floor. Patient ate [employer] baconator fries last night. Will place dietitian consult for diet education. - increase lantus to 32 units sq daily (have requested that nurse administer another 7 units today)  Tachycardia - Will continue to monitor on floor. I suspect this is secondary to decreased intravascular volume. Will administer 1 liter fluid bolus.  Active Problems:   Possible pregnancy - HCG up but transvaginal U/S reports: No intrauterine gestational sac, yolk sac, fetal pole, or cardiac activity visualized    History of trichomoniasis - treated per h and p prior to admission    Diarrhea - resolved  Hypokalemia - Replace IV  DVT prophylaxis: Heparin Code Status: Full Family Communication: d/c patient directly Disposition Plan: transfer to floor. Blood sugar still not well controlled and patient tachycardic with HR 120-140 while I was in room.   Consultants:   None   Procedures: None   Antimicrobials: None   Subjective: Pt has no new complaints. Reports eating baconator fries last night.  Objective: Vitals:   06/01/16 2317 06/02/16 0400 06/02/16 0825 06/02/16 0845  BP: 121/80 114/72 114/78   Pulse: (!) 109 97 (!) 121   Resp: (!) 22 18 15    Temp: 98.8 F (37.1 C) 98.4 F (36.9 C) 98.8 F (37.1 C)   TempSrc: Oral Oral Oral   SpO2: 100% 100% 98% 100%  Weight:      Height:        Intake/Output Summary (Last 24 hours) at 06/02/16 1215 Last data filed at 06/01/16 2223  Gross per 24 hour  Intake              243 ml  Output              600 ml  Net             -357 ml   Filed Weights   05/30/16 1646  05/31/16 0050  Weight: 49 kg (108 lb) 53.4 kg (117 lb 11.6 oz)    Examination:  General exam: Appears calm and comfortable, in nad. Dry mucous membranes Respiratory system: Clear to auscultation. Respiratory effort normal. Equal chest rise. Cardiovascular system: S1 & S2 heard, RRR. No JVD, murmurs, rubs, gallops or clicks. No pedal edema. Gastrointestinal system: Abdomen is nondistended, soft and nontender. No organomegaly or masses felt. Normal bowel sounds heard. Central nervous system: Alert and oriented. No focal neurological deficits. Extremities: Symmetric 5 x 5 power,  Skin: No rashes, lesions or ulcers, on limited exam. Psychiatry: Judgement and insight appear normal. Mood & affect appropriate.   Data Reviewed: I have personally reviewed following labs and imaging studies  CBC:  Recent Labs Lab 05/30/16 1717 05/31/16 0319  WBC 9.5 10.2  HGB 12.3 11.7*  HCT 37.2 34.8*  MCV 82.5 80.6  PLT 293 062   Basic Metabolic Panel:  Recent Labs Lab 06/01/16 1015 06/01/16 1424 06/01/16 1818 06/01/16 2103 06/02/16 0125  NA 134* 136 134* 132* 134*  K 3.7 4.0 4.1 4.7 3.7  CL 110 106 105 103 105  CO2 20* 19* 18* 21* 21*  GLUCOSE 240* 164* 240* 364* 245*  BUN <5* <5* 6 7 7  CREATININE 0.56 0.50 0.71 0.67 0.58  CALCIUM 8.1* 8.6* 8.7* 8.9 9.0   GFR: Estimated Creatinine Clearance: 90.5 mL/min (by C-G formula based on SCr of 0.58 mg/dL). Liver Function Tests:  Recent Labs Lab 05/30/16 1717  AST 19  ALT 17  ALKPHOS 50  BILITOT 1.5*  PROT 7.1  ALBUMIN 3.8    Recent Labs Lab 05/30/16 1717  LIPASE 24   No results for input(s): AMMONIA in the last 168 hours. Coagulation Profile: No results for input(s): INR, PROTIME in the last 168 hours. Cardiac Enzymes: No results for input(s): CKTOTAL, CKMB, CKMBINDEX, TROPONINI in the last 168 hours. BNP (last 3 results) No results for input(s): PROBNP in the last 8760 hours. HbA1C: No results for input(s): HGBA1C in the  last 72 hours. CBG:  Recent Labs Lab 06/01/16 1033 06/01/16 1244 06/01/16 1644 06/01/16 2156 06/02/16 0824  GLUCAP 213* 235* 245* 400* 394*   Lipid Profile: No results for input(s): CHOL, HDL, LDLCALC, TRIG, CHOLHDL, LDLDIRECT in the last 72 hours. Thyroid Function Tests: No results for input(s): TSH, T4TOTAL, FREET4, T3FREE, THYROIDAB in the last 72 hours. Anemia Panel: No results for input(s): VITAMINB12, FOLATE, FERRITIN, TIBC, IRON, RETICCTPCT in the last 72 hours. Sepsis Labs: No results for input(s): PROCALCITON, LATICACIDVEN in the last 168 hours.  Recent Results (from the past 240 hour(s))  Wet prep, genital     Status: Abnormal   Collection Time: 05/30/16 10:02 PM  Result Value Ref Range Status   Yeast Wet Prep HPF POC NONE SEEN NONE SEEN Final   Trich, Wet Prep NONE SEEN NONE SEEN Final   Clue Cells Wet Prep HPF POC PRESENT (A) NONE SEEN Final   WBC, Wet Prep HPF POC FEW (A) NONE SEEN Final   Sperm NONE SEEN  Final  MRSA PCR Screening     Status: None   Collection Time: 05/31/16 12:51 AM  Result Value Ref Range Status   MRSA by PCR NEGATIVE NEGATIVE Final    Comment:        The GeneXpert MRSA Assay (FDA approved for NASAL specimens only), is one component of a comprehensive MRSA colonization surveillance program. It is not intended to diagnose MRSA infection nor to guide or monitor treatment for MRSA infections.   C difficile quick scan w PCR reflex     Status: None   Collection Time: 06/01/16  6:07 AM  Result Value Ref Range Status   C Diff antigen NEGATIVE NEGATIVE Final   C Diff toxin NEGATIVE NEGATIVE Final   C Diff interpretation No C. difficile detected.  Final  Gastrointestinal Panel by PCR , Stool     Status: None   Collection Time: 06/01/16  6:07 AM  Result Value Ref Range Status   Campylobacter species NOT DETECTED NOT DETECTED Final   Plesimonas shigelloides NOT DETECTED NOT DETECTED Final   Salmonella species NOT DETECTED NOT DETECTED  Final   Yersinia enterocolitica NOT DETECTED NOT DETECTED Final   Vibrio species NOT DETECTED NOT DETECTED Final   Vibrio cholerae NOT DETECTED NOT DETECTED Final   Enteroaggregative E coli (EAEC) NOT DETECTED NOT DETECTED Final   Enteropathogenic E coli (EPEC) NOT DETECTED NOT DETECTED Final   Enterotoxigenic E coli (ETEC) NOT DETECTED NOT DETECTED Final   Shiga like toxin producing E coli (STEC) NOT DETECTED NOT DETECTED Final   Shigella/Enteroinvasive E coli (EIEC) NOT DETECTED NOT DETECTED Final   Cryptosporidium NOT DETECTED NOT DETECTED Final   Cyclospora cayetanensis NOT DETECTED NOT DETECTED Final  Entamoeba histolytica NOT DETECTED NOT DETECTED Final   Giardia lamblia NOT DETECTED NOT DETECTED Final   Adenovirus F40/41 NOT DETECTED NOT DETECTED Final   Astrovirus NOT DETECTED NOT DETECTED Final   Norovirus GI/GII NOT DETECTED NOT DETECTED Final   Rotavirus A NOT DETECTED NOT DETECTED Final   Sapovirus (I, II, IV, and V) NOT DETECTED NOT DETECTED Final     Radiology Studies: No results found.  Scheduled Meds: . heparin  5,000 Units Subcutaneous Q8H  . insulin aspart  0-5 Units Subcutaneous QHS  . insulin aspart  0-9 Units Subcutaneous TID WC  . [START ON 06/03/2016] insulin glargine  32 Units Subcutaneous Daily  . sodium chloride  1,000 mL Intravenous Once  . sodium chloride flush  3 mL Intravenous Q12H   Continuous Infusions:    LOS: 3 days    Time spent: > 35 minutes  Velvet Bathe, MD [facility] Pager [phone]  If 7PM-7AM, please contact night-coverage [email or web address] Password [email or web address] 06/02/2016, 12:15 PM

## 2016-06-02 NOTE — Progress Notes (Signed)
Report called to [place] on [address].

## 2016-06-03 LAB — GLUCOSE, CAPILLARY
Glucose-Capillary: 175 mg/dL — ABNORMAL HIGH (ref 65–99)
Glucose-Capillary: 235 mg/dL — ABNORMAL HIGH (ref 65–99)

## 2016-06-03 MED ORDER — INSULIN GLARGINE 100 UNIT/ML SOLOSTAR PEN: 32.0000 [IU] | PEN_INJECTOR | Freq: Every day | SUBCUTANEOUS | 0 refills | Status: DC

## 2016-06-03 MED ORDER — INSULIN GLARGINE 100 UNIT/ML ~~LOC~~ SOLN: 32.0000 [IU] | Freq: Every day | SUBCUTANEOUS | 11 refills | Status: DC

## 2016-06-03 NOTE — Progress Notes (Signed)
[name] to be D/C'd home per MD order.  Discussed with the patient and all questions fully answered.  VSS, Skin clean, dry and intact without evidence of skin break down, no evidence of skin tears noted. IV catheter discontinued intact. Site without signs and symptoms of complications. Dressing and pressure applied.  An After Visit Summary was printed and given to the patient. Patient received prescription.  D/c education completed with patient/family including follow up instructions, medication list, d/c activities limitations if indicated, with other d/c instructions including diabetic diet, as indicated by MD - patient able to verbalize understanding, all questions fully answered.   Patient instructed to return to ED, call 911, or call MD for any changes in condition.   Patient escorted via [place], and D/C home via private auto.  Milas Hock 06/03/2016 2:48 PM

## 2016-06-03 NOTE — Discharge Summary (Addendum)
Physician Discharge Summary  Ashley Freeman OMV:[id number] DOB: May 25, 1990 DOA: 05/30/2016  PCP: Arnoldo Morale, MD  Admit date: 05/30/2016 Discharge date: 06/03/2016  Time spent: > 35 minutes  Recommendations for Outpatient Follow-up:  1. Monitor blood sugars and adjust hypoglycemic agents accordingly   Discharge Diagnoses:  Principal Problem:   DKA (diabetic ketoacidoses) ([place]) Active Problems:   Possible pregnancy   History of trichomoniasis   Diarrhea   Discharge Condition: stable  Diet recommendation: carb modified diet  Filed Weights   05/30/16 1646 05/31/16 0050  Weight: 49 kg (108 lb) 53.4 kg (117 lb 11.6 oz)    History of present illness:  26 y/o with history of type 1 DM who presented in [place] Hospital Course:  DKA - suspect lack of adherence to diabetic diet and possibly hypoglycemic agents - improved control of blood sugars with Lantus 32 units SQ daily  Otherwise for known medical conditions listed above patient to continue medication regimen listed below.  Procedures:  None  Consultations:  none  Discharge Exam: Vitals:   06/02/16 2127 06/03/16 0448  BP: 117/65 120/69  Pulse: (!) 105 100  Resp: 19 20  Temp: 98.6 F (37 C) 98.1 F (36.7 C)    General: Pt in nad, alert and awake Cardiovascular: rrr, no rubs Respiratory: no increased wob. No wheezes  Discharge Instructions   Discharge Instructions    Call MD for:  severe uncontrolled pain    Complete by:  As directed    Diet - low sodium heart healthy    Complete by:  As directed    Discharge instructions    Complete by:  As directed    Please adhere to a diabetic diet. Follow up with your primary care physician in 1-2 weeks or sooner.   Increase activity slowly    Complete by:  As directed      Current Discharge Medication List    CONTINUE these medications which have CHANGED   Details  Insulin Glargine (LANTUS) 100 UNIT/ML Solostar Pen Inject 32 Units into the skin at  bedtime. Qty: 15 mL, Refills: 0   Associated Diagnoses: Diabetes mellitus type 1, uncontrolled, with complications ([place])      CONTINUE these medications which have NOT CHANGED   Details  gabapentin (NEURONTIN) 300 MG capsule Take 1 capsule (300 mg total) by mouth 2 (two) times daily. Qty: 60 capsule, Refills: 3    glucose blood (TRUE METRIX BLOOD GLUCOSE TEST) test strip Use 3 times daily before meals Qty: 100 each, Refills: 12   Associated Diagnoses: Diabetes mellitus type 1, uncontrolled, with complications ([facility])    insulin aspart (NOVOLOG FLEXPEN) 100 UNIT/ML FlexPen Inject 0-12 Units into the skin 3 (three) times daily with meals. As per sliding scale Qty: 15 mL, Refills: 11    traMADol (ULTRAM) 50 MG tablet Take 1 tablet (50 mg total) by mouth every 6 (six) hours as needed. Qty: 15 tablet, Refills: 0    atorvastatin (LIPITOR) 20 MG tablet Take 1 tablet (20 mg total) by mouth daily. Qty: 30 tablet, Refills: 3   Associated Diagnoses: Pure hypercholesterolemia    lisinopril (PRINIVIL,ZESTRIL) 2.5 MG tablet Take 1 tablet (2.5 mg total) by mouth daily. Qty: 30 tablet, Refills: 2      STOP taking these medications     diazepam (VALIUM) 5 MG tablet      methocarbamol (ROBAXIN) 500 MG tablet        Allergies  Allergen Reactions  . Penicillins Hives and Rash  Has patient had a PCN reaction causing immediate rash, facial/tongue/throat swelling, SOB or lightheadedness with hypotension: Yes Has patient had a PCN reaction causing severe rash involving mucus membranes or skin necrosis: No Has patient had a PCN reaction that required hospitalization: Yes Has patient had a PCN reaction occurring within the last 10 years: No If all of the above answers are "NO", then may proceed with Cephalosporin use.  . Benadryl [Diphenhydramine] Itching      The results of significant diagnostics from this hospitalization (including imaging, microbiology, ancillary and laboratory) are listed  below for reference.    Significant Diagnostic Studies: US Ob Comp < 14 Wks  Result Date: 05/30/2016 CLINICAL DATA:  26 year old pregnant female with pelvic pain. Estimated gestational age of [redacted] weeks by LMP. Quantitative beta HCG of 641. EXAM: OBSTETRIC <14 WK [place] AND TRANSVAGINAL OB US TECHNIQUE: Both transabdominal and transvaginal ultrasound examinations were performed for complete evaluation of the gestation as well as the maternal uterus, adnexal regions, and pelvic cul-de-sac. Transvaginal technique was performed to assess early pregnancy. COMPARISON:  None. FINDINGS: Intrauterine gestational sac: None Yolk sac:  Not Visualized. Embryo:  Not Visualized. Subchorionic hemorrhage:  None visualized. Maternal uterus/adnexae: No suspicious ovarian abnormalities noted. No adnexal masses. A small amount of free pelvic fluid is noted. IMPRESSION: No intrauterine gestational sac, yolk sac, fetal pole, or cardiac activity visualized. Differential considerations include intrauterine gestation too early to be sonographically visualized, spontaneous abortion, or ectopic pregnancy. Consider follow-up ultrasound in 10 days and serial quantitative beta HCG follow-up. Small amount of free pelvic fluid which is nonspecific. Electronically Signed   By: Margarette Canada M.D.   On: 05/30/2016 21:13   US Ob Transvaginal  Result Date: 05/30/2016 CLINICAL DATA:  26 year old pregnant female with pelvic pain. Estimated gestational age of [redacted] weeks by LMP. Quantitative beta HCG of 641. EXAM: OBSTETRIC <14 WK [place] AND TRANSVAGINAL OB US TECHNIQUE: Both transabdominal and transvaginal ultrasound examinations were performed for complete evaluation of the gestation as well as the maternal uterus, adnexal regions, and pelvic cul-de-sac. Transvaginal technique was performed to assess early pregnancy. COMPARISON:  None. FINDINGS: Intrauterine gestational sac: None Yolk sac:  Not Visualized. Embryo:  Not Visualized. Subchorionic hemorrhage:   None visualized. Maternal uterus/adnexae: No suspicious ovarian abnormalities noted. No adnexal masses. A small amount of free pelvic fluid is noted. IMPRESSION: No intrauterine gestational sac, yolk sac, fetal pole, or cardiac activity visualized. Differential considerations include intrauterine gestation too early to be sonographically visualized, spontaneous abortion, or ectopic pregnancy. Consider follow-up ultrasound in 10 days and serial quantitative beta HCG follow-up. Small amount of free pelvic fluid which is nonspecific. Electronically Signed   By: Margarette Canada M.D.   On: 05/30/2016 21:13    Microbiology: Recent Results (from the past 240 hour(s))  Wet prep, genital     Status: Abnormal   Collection Time: 05/30/16 10:02 PM  Result Value Ref Range Status   Yeast Wet Prep HPF POC NONE SEEN NONE SEEN Final   Trich, Wet Prep NONE SEEN NONE SEEN Final   Clue Cells Wet Prep HPF POC PRESENT (A) NONE SEEN Final   WBC, Wet Prep HPF POC FEW (A) NONE SEEN Final   Sperm NONE SEEN  Final  MRSA PCR Screening     Status: None   Collection Time: 05/31/16 12:51 AM  Result Value Ref Range Status   MRSA by PCR NEGATIVE NEGATIVE Final    Comment:        The GeneXpert MRSA Assay (  FDA approved for NASAL specimens only), is one component of a comprehensive MRSA colonization surveillance program. It is not intended to diagnose MRSA infection nor to guide or monitor treatment for MRSA infections.   C difficile quick scan w PCR reflex     Status: None   Collection Time: 06/01/16  6:07 AM  Result Value Ref Range Status   C Diff antigen NEGATIVE NEGATIVE Final   C Diff toxin NEGATIVE NEGATIVE Final   C Diff interpretation No C. difficile detected.  Final  Gastrointestinal Panel by PCR , Stool     Status: None   Collection Time: 06/01/16  6:07 AM  Result Value Ref Range Status   Campylobacter species NOT DETECTED NOT DETECTED Final   Plesimonas shigelloides NOT DETECTED NOT DETECTED Final    Salmonella species NOT DETECTED NOT DETECTED Final   Yersinia enterocolitica NOT DETECTED NOT DETECTED Final   Vibrio species NOT DETECTED NOT DETECTED Final   Vibrio cholerae NOT DETECTED NOT DETECTED Final   Enteroaggregative E coli (EAEC) NOT DETECTED NOT DETECTED Final   Enteropathogenic E coli (EPEC) NOT DETECTED NOT DETECTED Final   Enterotoxigenic E coli (ETEC) NOT DETECTED NOT DETECTED Final   Shiga like toxin producing E coli (STEC) NOT DETECTED NOT DETECTED Final   Shigella/Enteroinvasive E coli (EIEC) NOT DETECTED NOT DETECTED Final   Cryptosporidium NOT DETECTED NOT DETECTED Final   Cyclospora cayetanensis NOT DETECTED NOT DETECTED Final   Entamoeba histolytica NOT DETECTED NOT DETECTED Final   Giardia lamblia NOT DETECTED NOT DETECTED Final   Adenovirus F40/41 NOT DETECTED NOT DETECTED Final   Astrovirus NOT DETECTED NOT DETECTED Final   Norovirus GI/GII NOT DETECTED NOT DETECTED Final   Rotavirus A NOT DETECTED NOT DETECTED Final   Sapovirus (I, II, IV, and V) NOT DETECTED NOT DETECTED Final     Labs: Basic Metabolic Panel:  Recent Labs Lab 06/01/16 1015 06/01/16 1424 06/01/16 1818 06/01/16 2103 06/02/16 0125  NA 134* 136 134* 132* 134*  K 3.7 4.0 4.1 4.7 3.7  CL 110 106 105 103 105  CO2 20* 19* 18* 21* 21*  GLUCOSE 240* 164* 240* 364* 245*  BUN <5* <5* 6 7 7   CREATININE 0.56 0.50 0.71 0.67 0.58  CALCIUM 8.1* 8.6* 8.7* 8.9 9.0   Liver Function Tests:  Recent Labs Lab 05/30/16 1717  AST 19  ALT 17  ALKPHOS 50  BILITOT 1.5*  PROT 7.1  ALBUMIN 3.8    Recent Labs Lab 05/30/16 1717  LIPASE 24   No results for input(s): AMMONIA in the last 168 hours. CBC:  Recent Labs Lab 05/30/16 1717 05/31/16 0319  WBC 9.5 10.2  HGB 12.3 11.7*  HCT 37.2 34.8*  MCV 82.5 80.6  PLT 293 280   Cardiac Enzymes: No results for input(s): CKTOTAL, CKMB, CKMBINDEX, TROPONINI in the last 168 hours. BNP: BNP (last 3 results) No results for input(s): BNP in the  last 8760 hours.  ProBNP (last 3 results) No results for input(s): PROBNP in the last 8760 hours.  CBG:  Recent Labs Lab 06/02/16 1239 06/02/16 1638 06/02/16 2035 06/03/16 0809 06/03/16 1148  GLUCAP 203* 273* 154* 175* 235*    Signed:  Velvet Bathe MD.  [facility] 06/03/2016, 12:56 PM  ]

## 2016-06-25 ENCOUNTER — Inpatient Hospital Stay (HOSPITAL_COMMUNITY): Payer: Self-pay

## 2016-06-25 ENCOUNTER — Inpatient Hospital Stay (HOSPITAL_COMMUNITY)
Admission: AD | Admit: 2016-06-25 | Discharge: 2016-06-25 | Disposition: A | Discharge status: Home / Self Care | Payer: Self-pay | Source: Ambulatory Visit | Attending: Obstetrics and Gynecology | Admitting: Obstetrics and Gynecology

## 2016-06-25 ENCOUNTER — Encounter (HOSPITAL_COMMUNITY): Payer: Self-pay | Admitting: *Deleted

## 2016-06-25 DIAGNOSIS — O3680X0 Pregnancy with inconclusive fetal viability, not applicable or unspecified: Secondary | ICD-10-CM

## 2016-06-25 DIAGNOSIS — Z880 Allergy status to penicillin: Secondary | ICD-10-CM | POA: Insufficient documentation

## 2016-06-25 DIAGNOSIS — O24012 Pre-existing diabetes mellitus, type 1, in pregnancy, second trimester: Secondary | ICD-10-CM | POA: Insufficient documentation

## 2016-06-25 DIAGNOSIS — Z3200 Encounter for pregnancy test, result unknown: Secondary | ICD-10-CM

## 2016-06-25 DIAGNOSIS — R109 Unspecified abdominal pain: Secondary | ICD-10-CM | POA: Insufficient documentation

## 2016-06-25 DIAGNOSIS — Z3A14 14 weeks gestation of pregnancy: Secondary | ICD-10-CM | POA: Insufficient documentation

## 2016-06-25 DIAGNOSIS — Z794 Long term (current) use of insulin: Secondary | ICD-10-CM | POA: Insufficient documentation

## 2016-06-25 DIAGNOSIS — O200 Threatened abortion: Secondary | ICD-10-CM

## 2016-06-25 DIAGNOSIS — E109 Type 1 diabetes mellitus without complications: Secondary | ICD-10-CM | POA: Insufficient documentation

## 2016-06-25 DIAGNOSIS — O26899 Other specified pregnancy related conditions, unspecified trimester: Secondary | ICD-10-CM | POA: Diagnosis present

## 2016-06-25 DIAGNOSIS — O26891 Other specified pregnancy related conditions, first trimester: Secondary | ICD-10-CM | POA: Insufficient documentation

## 2016-06-25 LAB — POCT PREGNANCY, URINE: Preg Test, Ur: POSITIVE — AB

## 2016-06-25 NOTE — Discharge Instructions (Signed)
Abdominal Pain During Pregnancy Belly (abdominal) pain is common during pregnancy. Most of the time, it is not a serious problem. Other times, it can be a sign that something is wrong with the pregnancy. Always tell your doctor if you have belly pain. Follow these instructions at home: Monitor your belly pain for any changes. The following actions may help you feel better:  Do not have sex (intercourse) or put anything in your vagina until you feel better.  Rest until your pain stops.  Drink clear fluids if you feel sick to your stomach (nauseous). Do not eat solid food until you feel better.  Only take medicine as told by your doctor.  Keep all doctor visits as told. Get help right away if:  You are bleeding, leaking fluid, or pieces of tissue come out of your vagina.  You have more pain or cramping.  You keep throwing up (vomiting).  You have pain when you pee (urinate) or have blood in your pee.  You have a fever.  You do not feel your baby moving as much.  You feel very weak or feel like passing out.  You have trouble breathing, with or without belly pain.  You have a very bad headache and belly pain.  You have fluid leaking from your vagina and belly pain.  You keep having watery poop (diarrhea).  Your belly pain does not go away after resting, or the pain gets worse. This information is not intended to replace advice given to you by your health care provider. Make sure you discuss any questions you have with your health care provider. Document Released: 04/04/2009 Document Revised: 11/23/2015 Document Reviewed: 11/13/2012 Elsevier Interactive Patient Education  2017 [employer].

## 2016-06-25 NOTE — MAU Note (Signed)
Urine in lab 

## 2016-06-25 NOTE — MAU Note (Signed)
Was told she was preg on 2/1. Works and was unable to get to followup.  Wants to know if she still is.  Has had some cramping in lower abd. Had spotting once last week.

## 2016-06-25 NOTE — MAU Provider Note (Signed)
History     CSN: [id number]  Arrival date and time: 06/25/16 1301   First Provider Initiated Contact with Patient 06/25/16 1737      Chief Complaint  Patient presents with  . Possible Pregnancy   HPI   Ms.Ashley Freeman is a 26 y.o. female G2P0010 @ [redacted]w[redacted]d here in MAU for pregnancy confirmation. She has a history of Diabetes Mellitus diagnosed at age 53. She was admitted to the hospital this month with DKA and found out she was pregnant. She was told she needed to follow up with OB and the patient was not able to. She has had off and on abdominal pain. No bleeding. She rates her pain 0/10, however did have some pain while waiting in the waiting room. The pain is located on both sides of her abdomen when it comes. The pain comes and goes and when it comes it is very minor.    OB History    Gravida Para Term Preterm AB Living   2 0     1 0   SAB TAB Ectopic Multiple Live Births   1              Past Medical History:  Diagnosis Date  . Diabetes mellitus 2001   Diagnosed at age 58 ; Type I  . Diarrhea 05/30/2016  . DKA (diabetic ketoacidoses) ([place]) 08/19/2013  . DKA (diabetic ketoacidoses) ([place]) 05/30/2016  . Gonorrhea 08/2011   Treated in 09/2011    Past Surgical History:  Procedure Laterality Date  . INCISION AND DRAINAGE PERIRECTAL ABSCESS Right 08/18/2013   Procedure: IRRIGATION AND DEBRIDEMENT GLUTEAL ABSCESS;  Surgeon: Ralene Ok, MD;  Location: [place];  Service: General;  Laterality: Right;  . INCISION AND DRAINAGE PERIRECTAL ABSCESS Right 09/19/2013   Procedure: IRRIGATION AND DEBRIDEMENT RIGHT GLUTEAL AND LABIAL ABSCESSES;  Surgeon: Ralene Ok, MD;  Location: [facility];  Service: General;  Laterality: Right;  . INCISION AND DRAINAGE PERIRECTAL ABSCESS Right 09/24/2013   Procedure: IRRIGATION AND DEBRIDEMENT PERIRECTAL ABSCESS;  Surgeon: Gwenyth Ober, MD;  Location: [place];  Service: General;  Laterality: Right;  . NO PAST SURGERIES      Family History  Problem  Relation Age of Onset  . Asthma Mother   . Gout Father   . Diabetes Paternal Grandmother   . Anesthesia problems Neg Hx     Social History  Substance Use Topics  . Smoking status: Never Smoker  . Smokeless tobacco: Never Used  . Alcohol use No     Comment: occ    Allergies:  Allergies  Allergen Reactions  . Penicillins Hives and Rash    Has patient had a PCN reaction causing immediate rash, facial/tongue/throat swelling, SOB or lightheadedness with hypotension: Yes Has patient had a PCN reaction causing severe rash involving mucus membranes or skin necrosis: No Has patient had a PCN reaction that required hospitalization: Yes Has patient had a PCN reaction occurring within the last 10 years: No If all of the above answers are "NO", then may proceed with Cephalosporin use.  . Benadryl [Diphenhydramine] Itching    Facility-Administered Medications Prior to Admission  Medication Dose Route Frequency Provider Last Rate Last Dose  . Insulin Pen Needle (NOVOFINE) 200 each  20 packet Subcutaneous PRN Allie Bossier, MD       Prescriptions Prior to Admission  Medication Sig Dispense Refill Last Dose  . insulin aspart (NOVOLOG FLEXPEN) 100 UNIT/ML FlexPen Inject 0-12 Units into the skin 3 (three) times daily  with meals. As per sliding scale 15 mL 11 06/25/2016 at Unknown time  . Insulin Glargine (LANTUS) 100 UNIT/ML Solostar Pen Inject 32 Units into the skin at bedtime. 15 mL 0 06/25/2016 at Unknown time  . atorvastatin (LIPITOR) 20 MG tablet Take 1 tablet (20 mg total) by mouth daily. (Patient not taking: Reported on 05/30/2016) 30 tablet 3 Not Taking at Unknown time  . gabapentin (NEURONTIN) 300 MG capsule Take 1 capsule (300 mg total) by mouth 2 (two) times daily. (Patient not taking: Reported on 06/25/2016) 60 capsule 3 Not Taking at Unknown time  . glucose blood (TRUE METRIX BLOOD GLUCOSE TEST) test strip Use 3 times daily before meals 100 each 12 Taking  . lisinopril (PRINIVIL,ZESTRIL)  2.5 MG tablet Take 1 tablet (2.5 mg total) by mouth daily. (Patient not taking: Reported on 05/30/2016) 30 tablet 2 Not Taking at Unknown time  . traMADol (ULTRAM) 50 MG tablet Take 1 tablet (50 mg total) by mouth every 6 (six) hours as needed. (Patient not taking: Reported on 06/25/2016) 15 tablet 0 Not Taking at Unknown time   Results for orders placed or performed during the hospital encounter of 06/25/16 (from the past 48 hour(s))  Pregnancy, urine POC     Status: Abnormal   Collection Time: 06/25/16  1:33 PM  Result Value Ref Range   Preg Test, Ur POSITIVE (A) NEGATIVE    Comment:        THE SENSITIVITY OF THIS METHODOLOGY IS >24 mIU/mL    US Ob Comp Less 14 Wks  Result Date: 06/25/2016 CLINICAL DATA:  Cramping and pain with known pregnancy EXAM: OBSTETRIC <14 WK [place] AND TRANSVAGINAL OB US TECHNIQUE: Both transabdominal and transvaginal ultrasound examinations were performed for complete evaluation of the gestation as well as the maternal uterus, adnexal regions, and pelvic cul-de-sac. Transvaginal technique was performed to assess early pregnancy. COMPARISON:  None. FINDINGS: Intrauterine gestational sac: Single. Yolk sac:  Visualized. Embryo:  Visualized. Cardiac Activity: Visualized. Heart Rate: 72  bpm CRL:  5  mm   6 w   1 d                  [place] EDC: 02/17/2017 Subchorionic hemorrhage:  None visualized. Maternal uterus/adnexae: Maternal ovaries unremarkable. The sonographer reports trace intraperitoneal free fluid although this is not well demonstrated on static 2D images. IMPRESSION: 1. Single living intrauterine gestation at estimated 6 week 1 day gestational age by crown-rump length. Electronically Signed   By: Misty Stanley M.D.   On: 06/25/2016 18:56   Review of Systems  Constitutional: Negative for fever.  Genitourinary: Negative for dysuria.   Physical Exam   Blood pressure 130/88, pulse 97, temperature 98.6 F (37 C), temperature source Oral, resp. rate 18, weight 123 lb 12 oz  (56.1 kg), last menstrual period 03/14/2016, SpO2 100 %.  Physical Exam  Constitutional: She is oriented to person, place, and time. She appears well-developed and well-nourished. No distress.  HENT:  Head: Normocephalic.  Eyes: Pupils are equal, round, and reactive to light.  Respiratory: Effort normal.  GI: Soft. She exhibits no distension and no mass. There is no tenderness. There is no rebound and no guarding.  Musculoskeletal: Normal range of motion.  Neurological: She is alert and oriented to person, place, and time.  Skin: Skin is warm. She is not diaphoretic.  Psychiatric: Her behavior is normal.    MAU Course  Procedures  None  MDM  Pregnancy test positive  Unable to doppler fetal  heart tones.  Quant on 1/31: 641, US transvaginal ordered today Discussed [place] in detail with the patient. Size < dates. Fetal heart rate low (70's), although could be normal for 6 week fetus. Recommended repeat US in one week, patient agreeable to plan of care.   Assessment and Plan   A:  1. Possible pregnancy   2. Abdominal pain in pregnancy, antepartum     P:  Discharge home in stable condition  IUP confirmed  [place] ordered placed for follow up US in 1 week.  Strict return precautions Patient to follow up in the [place] following [place]   Jennifer I Rasch, NP 06/25/2016 8:01 PM

## 2016-06-26 ENCOUNTER — Ambulatory Visit (HOSPITAL_COMMUNITY): Admit: 2016-06-26 | Payer: Self-pay

## 2016-06-27 ENCOUNTER — Ambulatory Visit: Payer: Self-pay | Admitting: Family Medicine

## 2016-06-27 ENCOUNTER — Inpatient Hospital Stay (HOSPITAL_COMMUNITY)
Admission: AD | Admit: 2016-06-27 | Discharge: 2016-06-27 | Disposition: A | Discharge status: Home / Self Care | Payer: Self-pay | Source: Ambulatory Visit | Attending: Obstetrics & Gynecology | Admitting: Obstetrics & Gynecology

## 2016-06-27 ENCOUNTER — Inpatient Hospital Stay (HOSPITAL_COMMUNITY): Payer: Self-pay

## 2016-06-27 DIAGNOSIS — Z794 Long term (current) use of insulin: Secondary | ICD-10-CM | POA: Insufficient documentation

## 2016-06-27 DIAGNOSIS — O24011 Pre-existing diabetes mellitus, type 1, in pregnancy, first trimester: Secondary | ICD-10-CM | POA: Insufficient documentation

## 2016-06-27 DIAGNOSIS — O200 Threatened abortion: Secondary | ICD-10-CM | POA: Insufficient documentation

## 2016-06-27 DIAGNOSIS — E1010 Type 1 diabetes mellitus with ketoacidosis without coma: Secondary | ICD-10-CM | POA: Insufficient documentation

## 2016-06-27 DIAGNOSIS — O209 Hemorrhage in early pregnancy, unspecified: Secondary | ICD-10-CM

## 2016-06-27 DIAGNOSIS — Z880 Allergy status to penicillin: Secondary | ICD-10-CM | POA: Insufficient documentation

## 2016-06-27 DIAGNOSIS — Z3A01 Less than 8 weeks gestation of pregnancy: Secondary | ICD-10-CM | POA: Insufficient documentation

## 2016-06-27 LAB — CBC
HCT: 36.2 % (ref 36.0–46.0)
HEMOGLOBIN: 12.7 g/dL (ref 12.0–15.0)
MCH: 28.3 pg (ref 26.0–34.0)
MCHC: 35.1 g/dL (ref 30.0–36.0)
MCV: 80.6 fL (ref 78.0–100.0)
Platelets: 315 10*3/uL (ref 150–400)
RBC: 4.49 MIL/uL (ref 3.87–5.11)
RDW: 13.2 % (ref 11.5–15.5)
WBC: 8.8 10*3/uL (ref 4.0–10.5)

## 2016-06-27 LAB — URINALYSIS, ROUTINE W REFLEX MICROSCOPIC
Bilirubin Urine: NEGATIVE
Ketones, ur: NEGATIVE mg/dL
Leukocytes, UA: NEGATIVE
Nitrite: NEGATIVE
PROTEIN: NEGATIVE mg/dL
Specific Gravity, Urine: <1.005 — ABNORMAL LOW (ref 1.005–1.030)
pH: 6 (ref 5.0–8.0)

## 2016-06-27 LAB — URINALYSIS, MICROSCOPIC (REFLEX)

## 2016-06-27 LAB — WET PREP, GENITAL
SPERM: NONE SEEN
TRICH WET PREP: NONE SEEN
Yeast Wet Prep HPF POC: NONE SEEN

## 2016-06-27 NOTE — MAU Provider Note (Signed)
History     CSN: [id number]  Arrival date and time: 06/27/16 0258   First Provider Initiated Contact with Patient 06/27/16 2227      Chief Complaint  Patient presents with  . Vaginal Bleeding   Vaginal Bleeding  The patient's primary symptoms include pelvic pain and vaginal bleeding. This is a new problem. The current episode started today. The problem occurs constantly. The problem has been gradually worsening. Pain severity now: 6/10. The problem affects both sides. She is pregnant. Associated symptoms include constipation. Pertinent negatives include no diarrhea, dysuria, fever, frequency, nausea, urgency or vomiting. The vaginal bleeding is typical of menses. She has been passing clots. She has been passing tissue. Nothing aggravates the symptoms. She has tried NSAIDs for the symptoms. The treatment provided moderate relief. Menstrual history: LMP 03/14/16     Past Medical History:  Diagnosis Date  . Diabetes mellitus 2001   Diagnosed at age 52 ; Type I  . Diarrhea 05/30/2016  . DKA (diabetic ketoacidoses) ([place]) 08/19/2013  . DKA (diabetic ketoacidoses) ([place]) 05/30/2016  . Gonorrhea 08/2011   Treated in 09/2011    Past Surgical History:  Procedure Laterality Date  . INCISION AND DRAINAGE PERIRECTAL ABSCESS Right 08/18/2013   Procedure: IRRIGATION AND DEBRIDEMENT GLUTEAL ABSCESS;  Surgeon: Ralene Ok, MD;  Location: [facility];  Service: General;  Laterality: Right;  . INCISION AND DRAINAGE PERIRECTAL ABSCESS Right 09/19/2013   Procedure: IRRIGATION AND DEBRIDEMENT RIGHT GLUTEAL AND LABIAL ABSCESSES;  Surgeon: Ralene Ok, MD;  Location: [facility];  Service: General;  Laterality: Right;  . INCISION AND DRAINAGE PERIRECTAL ABSCESS Right 09/24/2013   Procedure: IRRIGATION AND DEBRIDEMENT PERIRECTAL ABSCESS;  Surgeon: Gwenyth Ober, MD;  Location: [facility];  Service: General;  Laterality: Right;  . NO PAST SURGERIES      Family History  Problem Relation Age of Onset  . Asthma Mother    . Gout Father   . Diabetes Paternal Grandmother   . Anesthesia problems Neg Hx     Social History  Substance Use Topics  . Smoking status: Never Smoker  . Smokeless tobacco: Never Used  . Alcohol use No     Comment: occ    Allergies:  Allergies  Allergen Reactions  . Penicillins Hives and Rash    Has patient had a PCN reaction causing immediate rash, facial/tongue/throat swelling, SOB or lightheadedness with hypotension: Yes Has patient had a PCN reaction causing severe rash involving mucus membranes or skin necrosis: No Has patient had a PCN reaction that required hospitalization: Yes Has patient had a PCN reaction occurring within the last 10 years: No If all of the above answers are "NO", then may proceed with Cephalosporin use.  . Benadryl [Diphenhydramine] Itching    Facility-Administered Medications Prior to Admission  Medication Dose Route Frequency Provider Last Rate Last Dose  . Insulin Pen Needle (NOVOFINE) 200 each  20 packet Subcutaneous PRN Allie Bossier, MD       Prescriptions Prior to Admission  Medication Sig Dispense Refill Last Dose  . insulin aspart (NOVOLOG FLEXPEN) 100 UNIT/ML FlexPen Inject 0-12 Units into the skin 3 (three) times daily with meals. As per sliding scale 15 mL 11 06/27/2016 at Unknown time  . Insulin Glargine (LANTUS) 100 UNIT/ML Solostar Pen Inject 32 Units into the skin at bedtime. 15 mL 0 06/27/2016 at Unknown time  . gabapentin (NEURONTIN) 300 MG capsule Take 1 capsule (300 mg total) by mouth 2 (two) times daily. (Patient not taking: Reported on 06/25/2016)  60 capsule 3 Not Taking at Unknown time  . glucose blood (TRUE METRIX BLOOD GLUCOSE TEST) test strip Use 3 times daily before meals 100 each 12 Taking    Review of Systems  Constitutional: Negative for fever.  Gastrointestinal: Positive for constipation. Negative for diarrhea, nausea and vomiting.  Genitourinary: Positive for pelvic pain and vaginal bleeding. Negative for dysuria,  frequency and urgency.   Physical Exam   Blood pressure 120/82, pulse 101, temperature 98.5 F (36.9 C), temperature source Oral, resp. rate 18, height 5\' 3"  (1.6 m), weight 126 lb (57.2 kg), last menstrual period 03/14/2016, SpO2 100 %.  Physical Exam  Nursing note and vitals reviewed. Constitutional: She is oriented to person, place, and time. She appears well-developed and well-nourished. No distress.  HENT:  Head: Normocephalic.  Cardiovascular: Normal rate.   Respiratory: Effort normal.  GI: Soft. There is no tenderness. There is no rebound.  Neurological: She is alert and oriented to person, place, and time.  Skin: Skin is warm and dry.  Psychiatric: She has a normal mood and affect.   Results for orders placed or performed during the hospital encounter of 06/27/16 (from the past 24 hour(s))  Urinalysis, Routine w reflex microscopic     Status: Abnormal   Collection Time: 06/27/16  8:16 PM  Result Value Ref Range   Color, Urine YELLOW YELLOW   APPearance CLEAR CLEAR   Specific Gravity, Urine <1.005 (L) 1.005 - 1.030   pH 6.0 5.0 - 8.0   Glucose, UA >=500 (A) NEGATIVE mg/dL   Hgb urine dipstick LARGE (A) NEGATIVE   Bilirubin Urine NEGATIVE NEGATIVE   Ketones, ur NEGATIVE NEGATIVE mg/dL   Protein, ur NEGATIVE NEGATIVE mg/dL   Nitrite NEGATIVE NEGATIVE   Leukocytes, UA NEGATIVE NEGATIVE  Urinalysis, Microscopic (reflex)     Status: Abnormal   Collection Time: 06/27/16  8:16 PM  Result Value Ref Range   RBC / HPF TOO NUMEROUS TO COUNT 0 - 5 RBC/hpf   WBC, UA 0-5 0 - 5 WBC/hpf   Bacteria, UA FEW (A) NONE SEEN   Squamous Epithelial / LPF 0-5 (A) NONE SEEN  Wet prep, genital     Status: Abnormal   Collection Time: 06/27/16 10:31 PM  Result Value Ref Range   Yeast Wet Prep HPF POC NONE SEEN NONE SEEN   Trich, Wet Prep NONE SEEN NONE SEEN   Clue Cells Wet Prep HPF POC PRESENT (A) NONE SEEN   WBC, Wet Prep HPF POC FEW (A) NONE SEEN   Sperm NONE SEEN   CBC     Status: None    Collection Time: 06/27/16 10:39 PM  Result Value Ref Range   WBC 8.8 4.0 - 10.5 K/uL   RBC 4.49 3.87 - 5.11 MIL/uL   Hemoglobin 12.7 12.0 - 15.0 g/dL   HCT 36.2 36.0 - 46.0 %   MCV 80.6 78.0 - 100.0 fL   MCH 28.3 26.0 - 34.0 pg   MCHC 35.1 30.0 - 36.0 g/dL   RDW 13.2 11.5 - 15.5 %   Platelets 315 150 - 400 K/uL   US Ob Transvaginal  Result Date: 06/27/2016 CLINICAL DATA:  Bleeding in the first trimester pregnancy EXAM: TRANSVAGINAL OB ULTRASOUND TECHNIQUE: Transvaginal ultrasound was performed for complete evaluation of the gestation as well as the maternal uterus, adnexal regions, and pelvic cul-de-sac. COMPARISON:  06/25/2016 ultrasound FINDINGS: Intrauterine gestational sac: Single Yolk sac:  Visualized. Embryo:  Visualized. Cardiac Activity: Visualized. Heart Rate: 38-45 bpm CRL: 3.4 mm  5 w 6 d [place] EDC: 02/17/2017 based on prior ultrasound and 02/21/2017 based on this study. Subchorionic hemorrhage:  None visualized. Maternal uterus/adnexae: Unremarkable IMPRESSION: Single intrauterine gestation without significant progression in size. Low gestational heart rate of 38 to 45 beats per minute, concerning for future viability of the gestation. Electronically Signed   By: Ashley Royalty M.D.   On: 06/27/2016 23:13    MAU Course  Procedures  MDM This is a very desired pregnancy. Discussed ultrasound results with the patient at length. She desires to continue waiting and watching at this time. Discussed high likelihood for a miscarriage, and reviewed bleeding precautions at length. Patient has FU appointment scheduled for 07/02/16. Encouraged patient to keep this appointment so that we could follow up at this time.   Assessment and Plan   1. Threatened abortion in first trimester   2. Vaginal bleeding in pregnancy, first trimester    DC home Comfort measures reviewed  Bleeding precautions RX: none  Return to MAU as needed FU with OB as planned  [place] for  [facility] Follow up.   Specialty:  Obstetrics and Gynecology Contact information: [place] [phone]           Mathis Bud 06/27/2016, 10:28 PM

## 2016-06-27 NOTE — Discharge Instructions (Signed)
Miscarriage °A miscarriage is the loss of an unborn baby (fetus) before the 20th week of pregnancy. The cause is often unknown. °Follow these instructions at home: °· You may need to stay in bed (bed rest), or you may be able to do light activity. Go about activity as told by your doctor. °· Have help at home. °· Write down how many pads you use each day. Write down how soaked they are. °· Do not use tampons. Do not wash out your vagina (douche) or have sex (intercourse) until your doctor approves. °· Only take medicine as told by your doctor. °· Do not take aspirin. °· Keep all doctor visits as told. °· If you or your partner have problems with grieving, talk to your doctor. You can also try counseling. Give yourself time to grieve before trying to get pregnant again. °Get help right away if: °· You have bad cramps or pain in your back or belly (abdomen). °· You have a fever. °· You pass large clumps of blood (clots) from your vagina that are walnut-sized or larger. Save the clumps for your doctor to see. °· You pass large amounts of tissue from your vagina. Save the tissue for your doctor to see. °· You have more bleeding. °· You have thick, bad-smelling fluid (discharge) coming from the vagina. °· You get lightheaded, weak, or you pass out (faint). °· You have chills. °This information is not intended to replace advice given to you by your health care provider. Make sure you discuss any questions you have with your health care provider. °Document Released: 07/09/2011 Document Revised: 09/22/2015 Document Reviewed: 05/17/2011 °Elsevier Interactive Patient Education © 2017 [employer]vier Inc[employer] ° °

## 2016-06-27 NOTE — MAU Note (Addendum)
Pt states that she started having some vaginal spotting around 5pm. At 730pm pt states that it was in her underwear. States this is the first time this has happened. Having some lower abdominal cramping-rates 6/10. Has not taken anything. States this has been an ongoing problem. Pt denies intercourse or anything in vagina recently.  Denies urinary s/s.

## 2016-07-02 ENCOUNTER — Ambulatory Visit (HOSPITAL_COMMUNITY): Payer: Self-pay | Attending: Obstetrics and Gynecology

## 2016-07-03 ENCOUNTER — Ambulatory Visit: Payer: Self-pay

## 2016-09-05 ENCOUNTER — Encounter: Payer: Self-pay | Admitting: Emergency Medicine

## 2016-09-05 ENCOUNTER — Emergency Department (HOSPITAL_COMMUNITY): Payer: Self-pay

## 2016-09-05 ENCOUNTER — Emergency Department (HOSPITAL_COMMUNITY)
Admission: EM | Admit: 2016-09-05 | Discharge: 2016-09-05 | Disposition: A | Discharge status: Home / Self Care | Payer: Self-pay | Attending: Physician Assistant | Admitting: Physician Assistant

## 2016-09-05 DIAGNOSIS — J069 Acute upper respiratory infection, unspecified: Secondary | ICD-10-CM | POA: Insufficient documentation

## 2016-09-05 DIAGNOSIS — R739 Hyperglycemia, unspecified: Secondary | ICD-10-CM

## 2016-09-05 DIAGNOSIS — E1065 Type 1 diabetes mellitus with hyperglycemia: Secondary | ICD-10-CM | POA: Insufficient documentation

## 2016-09-05 DIAGNOSIS — E1040 Type 1 diabetes mellitus with diabetic neuropathy, unspecified: Secondary | ICD-10-CM | POA: Insufficient documentation

## 2016-09-05 LAB — URINALYSIS, ROUTINE W REFLEX MICROSCOPIC
Bilirubin Urine: NEGATIVE
Glucose, UA: >=500 mg/dL — AB
KETONES UR: 20 mg/dL — AB
Nitrite: NEGATIVE
Protein, ur: NEGATIVE mg/dL
Specific Gravity, Urine: 1.031 — ABNORMAL HIGH (ref 1.005–1.030)
pH: 6 (ref 5.0–8.0)

## 2016-09-05 LAB — I-STAT CHEM 8, ED
BUN: 8 mg/dL (ref 6–20)
CALCIUM ION: 1.12 mmol/L — AB (ref 1.15–1.40)
CREATININE: 0.5 mg/dL (ref 0.44–1.00)
Chloride: 94 mmol/L — ABNORMAL LOW (ref 101–111)
Glucose, Bld: >700 mg/dL (ref 65–99)
HCT: 40 % (ref 36.0–46.0)
Hemoglobin: 13.6 g/dL (ref 12.0–15.0)
Potassium: 4.3 mmol/L (ref 3.5–5.1)
Sodium: 129 mmol/L — ABNORMAL LOW (ref 135–145)
TCO2: 24 mmol/L (ref 0–100)

## 2016-09-05 LAB — CBC WITH DIFFERENTIAL/PLATELET
BASOS PCT: 0 %
Basophils Absolute: 0 10*3/uL (ref 0.0–0.1)
Eosinophils Absolute: 0.4 10*3/uL (ref 0.0–0.7)
Eosinophils Relative: 8 %
HCT: 38 % (ref 36.0–46.0)
Hemoglobin: 13.1 g/dL (ref 12.0–15.0)
LYMPHS PCT: 42 %
Lymphs Abs: 2.3 10*3/uL (ref 0.7–4.0)
MCH: 27.8 pg (ref 26.0–34.0)
MCHC: 34.5 g/dL (ref 30.0–36.0)
MCV: 80.7 fL (ref 78.0–100.0)
MONO ABS: 0.4 10*3/uL (ref 0.1–1.0)
Monocytes Relative: 7 %
Neutro Abs: 2.4 10*3/uL (ref 1.7–7.7)
Neutrophils Relative %: 43 %
Platelets: 256 10*3/uL (ref 150–400)
RBC: 4.71 MIL/uL (ref 3.87–5.11)
RDW: 12.6 % (ref 11.5–15.5)
WBC: 5.5 10*3/uL (ref 4.0–10.5)

## 2016-09-05 LAB — CBG MONITORING, ED: GLUCOSE-CAPILLARY: 381 mg/dL — AB (ref 65–99)

## 2016-09-05 LAB — BASIC METABOLIC PANEL
Anion gap: 10 (ref 5–15)
BUN: 9 mg/dL (ref 6–20)
CALCIUM: 9.4 mg/dL (ref 8.9–10.3)
CO2: 27 mmol/L (ref 22–32)
Chloride: 93 mmol/L — ABNORMAL LOW (ref 101–111)
Creatinine, Ser: 0.71 mg/dL (ref 0.44–1.00)
GFR calc Af Amer: >60 mL/min (ref >60)
Glucose, Bld: 577 mg/dL (ref 65–99)
POTASSIUM: 4.6 mmol/L (ref 3.5–5.1)
Sodium: 130 mmol/L — ABNORMAL LOW (ref 135–145)

## 2016-09-05 LAB — BLOOD GAS, VENOUS
Acid-Base Excess: 1.9 mmol/L (ref 0.0–2.0)
Bicarbonate: 28.7 mmol/L — ABNORMAL HIGH (ref 20.0–28.0)
O2 Saturation: 31.2 %
PATIENT TEMPERATURE: 98.6
PCO2 VEN: 56.2 mmHg (ref 44.0–60.0)
PH VEN: 7.328 (ref 7.250–7.430)

## 2016-09-05 MED ORDER — INSULIN ASPART PROT & ASPART (70-30 MIX) 100 UNIT/ML ~~LOC~~ SUSP: 10.0000 [IU] | Freq: Once | SUBCUTANEOUS | Status: DC

## 2016-09-05 MED ORDER — SODIUM CHLORIDE 0.9 % IV BOLUS (SEPSIS): 1000.0000 mL | Freq: Once | INTRAVENOUS | Status: DC

## 2016-09-05 MED ORDER — INSULIN GLARGINE 100 UNIT/ML ~~LOC~~ SOLN
20.0000 [IU] | Freq: Once | SUBCUTANEOUS | Status: AC
  Administered 2016-09-05: 20 [IU] via SUBCUTANEOUS
  Filled 2016-09-05: qty 0.2

## 2016-09-05 MED ORDER — SODIUM CHLORIDE 0.9 % IV BOLUS (SEPSIS)
1000.0000 mL | Freq: Once | INTRAVENOUS | Status: AC
  Administered 2016-09-05: 1000 mL via INTRAVENOUS

## 2016-09-05 MED ORDER — INSULIN ASPART 100 UNIT/ML ~~LOC~~ SOLN
10.0000 [IU] | Freq: Once | SUBCUTANEOUS | Status: AC
  Administered 2016-09-05: 10 [IU] via SUBCUTANEOUS
  Filled 2016-09-05: qty 1

## 2016-09-05 NOTE — ED Notes (Signed)
Patient unable to give a urine sample at this time.

## 2016-09-05 NOTE — ED Notes (Signed)
During blood draw, pt reported "If my sugar is high I don't want to have to stay. I know it will be high I had a gingerale this morning. I just want to be able to go home."

## 2016-09-05 NOTE — ED Notes (Signed)
PA made aware of abnormal blood sugar

## 2016-09-05 NOTE — ED Provider Notes (Signed)
[facility] DEPT Provider Note   CSN: [id number] Arrival date & time: 09/05/16  0818  By signing my name below, I, Reola Mosher, attest that this documentation has been prepared under the direction and in the presence of [facility], PA-C.  Electronically Signed: Reola Mosher, ED Scribe. 09/05/16. 11:33 AM.  History   Chief Complaint Chief Complaint  Patient presents with  . Cough   The history is provided by the patient. No language interpreter was used.   HPI Comments: Ashley Freeman is a 26 y.o. female with a h/o DM1 w/ prior DKA, who presents to the Emergency Department complaining of persistent non-productive cough beginning four days ago. She notes associated sore throat, sneezing, and increased fatigue, chills, rhinorrhea, congestion. She also reports some polydipsia and has been attempting to remedy this by drinking increased orange juice. Pt has been taking OTC Cold/Flu medications at home w/o significant relief of her symptoms. Her CBG has been running in the 300s to 400s at home. She has been compliant w/ her insulin injections recently and she denies her symptoms feeling similar to her prior bouts of DKA. Pt does have a h/o seasonal allergies, but she is not currently medicated for this. Pt has recently been around her cousin with similar URI-like symptoms. Pt did receive her yearly influenza vaccination this year. She denies fever, shortness of breath, ear pain, diarrhea, sinus pressure/pain, or any other associated symptoms. LNMP: 08/28/16.   Past Medical History:  Diagnosis Date  . Diabetes mellitus 2001   Diagnosed at age 9 ; Type I  . Diarrhea 05/30/2016  . DKA (diabetic ketoacidoses) ([place] [facility]) 08/19/2013  . DKA (diabetic ketoacidoses) ([place]) 05/30/2016  . Gonorrhea 08/2011   Treated in 09/2011   Patient Active Problem List   Diagnosis Date Noted  . Pregnancy of unknown anatomic location 06/25/2016  . Abdominal pain in pregnancy, antepartum 06/25/2016   . Possible pregnancy 05/31/2016  . History of trichomoniasis 05/31/2016  . Diarrhea 05/31/2016  . DKA (diabetic ketoacidoses) ([place]) 05/30/2016  . Hyperlipidemia 03/28/2016  . Diabetic neuropathy ([place]) 01/16/2016  . DKA, type 1 ([facility]) 11/24/2015  . BV (bacterial vaginosis) 11/24/2015  . Chest pain 11/24/2015  . AKI (acute kidney injury) ([facility]) 07/26/2014  . Infection of urinary tract 04/08/2014  . PID (acute pelvic inflammatory disease) 04/06/2014  . Diabetic ketoacidosis without coma associated with type 1 diabetes mellitus ([facility]) 09/19/2013  . Infected cyst of Bartholin's gland duct 09/19/2013  . Bartholin's gland abscess 09/19/2013  . Sepsis ([place]) 09/19/2013  . Abscess, gluteal, right 08/24/2013  . Health care maintenance 08/24/2013  . Hypophosphatemia 08/18/2013  . Hypomagnesemia 08/18/2013  . Leukocytosis, unspecified 08/17/2013  . Fever, unspecified 08/17/2013  . H/O medication noncompliance 08/12/2013  . Hyponatremia 08/04/2012  . Anemia 02/19/2012  . Hyperglycemia 11/06/2011  . Sinus tachycardia ([place]) 10/17/2011  . Diabetes mellitus type 1, uncontrolled, with complications ([place]) 43/32/9518   Past Surgical History:  Procedure Laterality Date  . INCISION AND DRAINAGE PERIRECTAL ABSCESS Right 08/18/2013   Procedure: IRRIGATION AND DEBRIDEMENT GLUTEAL ABSCESS;  Surgeon: Ralene Ok, MD;  Location: [facility];  Service: General;  Laterality: Right;  . INCISION AND DRAINAGE PERIRECTAL ABSCESS Right 09/19/2013   Procedure: IRRIGATION AND DEBRIDEMENT RIGHT GLUTEAL AND LABIAL ABSCESSES;  Surgeon: Ralene Ok, MD;  Location: [facility];  Service: General;  Laterality: Right;  . INCISION AND DRAINAGE PERIRECTAL ABSCESS Right 09/24/2013   Procedure: IRRIGATION AND DEBRIDEMENT PERIRECTAL ABSCESS;  Surgeon: Gwenyth Ober, MD;  Location: [facility];  Service: General;  Laterality: Right;  . NO PAST SURGERIES     OB History    Gravida Para Term Preterm AB Living   2 0     1 0   SAB TAB Ectopic  Multiple Live Births   1             Home Medications    Prior to Admission medications   Medication Sig Start Date End Date Taking? Authorizing Provider  glucose blood (TRUE METRIX BLOOD GLUCOSE TEST) test strip Use 3 times daily before meals 01/16/16   Arnoldo Morale, MD  insulin aspart (NOVOLOG FLEXPEN) 100 UNIT/ML FlexPen Inject 0-12 Units into the skin 3 (three) times daily with meals. As per sliding scale 01/16/16   Arnoldo Morale, MD  Insulin Glargine (LANTUS) 100 UNIT/ML Solostar Pen Inject 32 Units into the skin at bedtime. 06/03/16   Velvet Bathe, MD   Family History Family History  Problem Relation Age of Onset  . Asthma Mother   . Gout Father   . Diabetes Paternal Grandmother   . Anesthesia problems Neg Hx    Social History Social History  Substance Use Topics  . Smoking status: Never Smoker  . Smokeless tobacco: Never Used  . Alcohol use No     Comment: occ   Allergies   Penicillins and Benadryl [diphenhydramine]  Review of Systems Review of Systems  Constitutional: Positive for chills and fatigue. Negative for fever.  HENT: Positive for congestion, rhinorrhea, sneezing and sore throat. Negative for ear pain, sinus pain and sinus pressure.   Respiratory: Positive for cough.   Cardiovascular: Negative for leg swelling.  Gastrointestinal: Negative for diarrhea.  Endocrine: Positive for polydipsia.   Physical Exam Updated Vital Signs BP 107/83 (BP Location: Left Arm)   Pulse (!) 110   Temp 98.1 F (36.7 C) (Oral)   Resp 16   Ht 5\' 3"  (1.6 m)   Wt 53.1 kg   LMP 08/28/2016   SpO2 94%   Breastfeeding? No   BMI 20.73 kg/m   Physical Exam  Constitutional: She appears well-developed and well-nourished. No distress.  HENT:  Head: Normocephalic and atraumatic.  Mouth/Throat: Uvula is midline, oropharynx is clear and moist and mucous membranes are normal. No oropharyngeal exudate, posterior oropharyngeal edema, posterior oropharyngeal erythema or tonsillar  abscesses.  Eyes: Conjunctivae are normal.  Neck: Normal range of motion. Neck supple.  Cardiovascular: Normal rate and regular rhythm.   Pulmonary/Chest: Effort normal and breath sounds normal. No stridor. No respiratory distress. She has no wheezes. She has no rales.  Frequent cough on exam.   Abdominal: Soft. She exhibits no distension. There is no tenderness.  Lymphadenopathy:    She has no cervical adenopathy.  Neurological: She is alert.  Skin: She is not diaphoretic.  Nursing note and vitals reviewed.  ED Treatments / Results  DIAGNOSTIC STUDIES: Oxygen Saturation is 94% on RA, adequate by my interpretation.   COORDINATION OF CARE: 10:42 AM-Discussed next steps with pt. Pt verbalized understanding and is agreeable with the plan.   Labs (all labs ordered are listed, but only abnormal results are displayed) Labs Reviewed  I-STAT CHEM 8, ED - Abnormal; Notable for the following:       Result Value   Sodium 129 (*)    Chloride 94 (*)    Glucose, Bld >700 (*)    Calcium, Ion 1.12 (*)    All other components within normal limits  BASIC METABOLIC PANEL  CBC WITH DIFFERENTIAL/PLATELET  URINALYSIS, ROUTINE  W REFLEX MICROSCOPIC  BLOOD GAS, VENOUS    EKG  EKG Interpretation None      Radiology Dg Chest 2 View  Result Date: 09/05/2016 CLINICAL DATA:  Worsening cough.  Congestion of the last 3-4 days. EXAM: CHEST  2 VIEW COMPARISON:  12/17/2015 FINDINGS: Normal heart size and mediastinal contours. No acute infiltrate or edema. No effusion or pneumothorax. No acute osseous findings. IMPRESSION: Negative chest. Electronically Signed   By: Monte Fantasia M.D.   On: 09/05/2016 10:56    Procedures Procedures   Medications Ordered in ED Medications  sodium chloride 0.9 % bolus 1,000 mL (not administered)  sodium chloride 0.9 % bolus 1,000 mL (not administered)    Initial Impression / Assessment and Plan / ED Course  I have reviewed the triage vital signs and the  nursing notes.  Pertinent labs & imaging results that were available during my care of the patient were reviewed by me and considered in my medical decision making (see chart for details).     Pt with type 1 diabetes with 4 days of cough/cold symptoms.  Has been drinking orange juice, did not take insulin today.  Blood sugar >700.  Pt agrees to move to main ED for further evaluation and treatment.  IVF, labs ordered.    Final Clinical Impressions(s) / ED Diagnoses   Final diagnoses:  Hyperglycemia  Upper respiratory tract infection, unspecified type   New Prescriptions New Prescriptions   No medications on file   ,I personally performed the services described in this documentation, which was scribed in my presence. The recorded information has been reviewed and is accurate.     Emergency Ultrasound:  US Guidance for needle guidance  Performed by Dr. Thomasene Lot Indication: IV access Linear probe used in real-time to visualize location of needle entry through skin. Interpretation: IV inserted into peripheral vein. Images not archived electronically.    Well-appearing 26 year old female with history of diabetes presenting with head cold. Patient reports that she's not been drinking a lot of fluid, she wishes. She otherwise feels well the nausea vomiting or diarrhea. We'll treat her with fluids, labs. Make sure the patient's not in DKA and is able to take by mouth.  2:26 PM Patient able to take by mouth. Patient not in DKA no anion gap. PH normal. Patient is given her home insulin. Patient wished to leave before finishing her fluids. I believe that this is safe. Patient's current glucose is 350.  Clayton Bibles, PA-C 09/05/16 1135    Macarthur Critchley, MD 09/05/16 1427

## 2016-09-05 NOTE — ED Notes (Signed)
ED Provider at bedside. 

## 2016-09-05 NOTE — ED Triage Notes (Signed)
Pt c/o gradual onset non-productive cough, clear rhinorrhea, tiredness, intermittent sore throat with hoarse voice. Some diarrhea last night, none today. No abdominal pain.

## 2016-09-05 NOTE — ED Notes (Signed)
IV infiltrated.  Minimal amount.  Per MD, stop fluids.  Will treat with SQ insulin instead.

## 2016-09-10 ENCOUNTER — Encounter (HOSPITAL_COMMUNITY): Payer: Self-pay | Admitting: Nurse Practitioner

## 2016-09-10 ENCOUNTER — Emergency Department (HOSPITAL_COMMUNITY)
Admission: EM | Admit: 2016-09-10 | Discharge: 2016-09-10 | Disposition: A | Discharge status: Home / Self Care | Payer: Self-pay | Attending: Emergency Medicine | Admitting: Emergency Medicine

## 2016-09-10 DIAGNOSIS — Z794 Long term (current) use of insulin: Secondary | ICD-10-CM | POA: Insufficient documentation

## 2016-09-10 DIAGNOSIS — J069 Acute upper respiratory infection, unspecified: Secondary | ICD-10-CM | POA: Insufficient documentation

## 2016-09-10 DIAGNOSIS — B9789 Other viral agents as the cause of diseases classified elsewhere: Secondary | ICD-10-CM

## 2016-09-10 DIAGNOSIS — E119 Type 2 diabetes mellitus without complications: Secondary | ICD-10-CM | POA: Insufficient documentation

## 2016-09-10 DIAGNOSIS — Z79899 Other long term (current) drug therapy: Secondary | ICD-10-CM | POA: Insufficient documentation

## 2016-09-10 MED ORDER — BENZONATATE 100 MG PO CAPS: 100.0000 mg | ORAL_CAPSULE | Freq: Three times a day (TID) | ORAL | 0 refills | Status: DC | PRN

## 2016-09-10 NOTE — Discharge Instructions (Signed)
It was my pleasure taking care of you today!   Your symptoms are likely due to a viral upper respiratory infection. Fortunately, we did not see evidence of serious infection and can treat your symptoms. Zyrtec and flonase nasal spray (over-the-counter) for nasal congestion, tessalon as needed for cough. Alternate between Tylenol and ibuprofen as needed for body aches / fevers.   Rest, drink plenty of fluids to be sure you are staying hydrated.   Please follow up with your primary doctor for discussion of your diagnoses and further evaluation after today's visit if symptoms have persisted longer than 14 days; Return to the ER for high fevers, difficulty breathing or other concerning symptoms

## 2016-09-10 NOTE — ED Triage Notes (Signed)
Pt presents with c/o URI. Her symptoms began about a week and half ago. She reports fevers, chills, headaches, dry cough, feeling ill. She was seen at [facility] last week and diagnosed with virus but says that she is not feeling better. shes tried to increase hydration, liquid cold and flu medicine with no relief

## 2016-09-10 NOTE — ED Provider Notes (Signed)
[facility] DEPT Provider Note   CSN: [id number] Arrival date & time: 09/10/16  1546  By signing my name below, I, [name] and Alexia Perkins-Maupin, attest that this documentation has been prepared under the direction and in the presence of  [facility], PA-C. Electronically Signed: Catarina Hartshorn, ED Scribe. 09/10/16. 6:12 PM.    History   Chief Complaint Chief Complaint  Patient presents with  . URI    HPI Ashley Freeman is a 26 y.o. female with h/o DM who presents to the Emergency Department complaining of persistent dry cough with associated post-tussive emesis, fatigue, sore throat, congestion, rhinorrhea, and chills that began a week ago. Pt was seen at [facility] 5 days ago for the same complaints and had negative CXR at that time. Pt states she took cold and flu medication and cough drops that did not alleviate the symptoms. She is tolerating food and fluids without difficulty. Pt admits to consuming orange juice, but states her CBGs have been stable in the 100s-200s.  She denies fever, nausea or abdominal pain.    The history is provided by the patient. No language interpreter was used.    Past Medical History:  Diagnosis Date  . Diabetes mellitus 2001   Diagnosed at age 70 ; Type I  . Diarrhea 05/30/2016  . DKA (diabetic ketoacidoses) ([place]) 08/19/2013  . DKA (diabetic ketoacidoses) ([place]) 05/30/2016  . Gonorrhea 08/2011   Treated in 09/2011    Patient Active Problem List   Diagnosis Date Noted  . Pregnancy of unknown anatomic location 06/25/2016  . Abdominal pain in pregnancy, antepartum 06/25/2016  . Possible pregnancy 05/31/2016  . History of trichomoniasis 05/31/2016  . Diarrhea 05/31/2016  . DKA (diabetic ketoacidoses) ([place]) 05/30/2016  . Hyperlipidemia 03/28/2016  . Diabetic neuropathy ([place]) 01/16/2016  . DKA, type 1 ([place]) 11/24/2015  . BV (bacterial vaginosis) 11/24/2015  . Chest pain 11/24/2015  . AKI (acute kidney injury)  ([place]) 07/26/2014  . Infection of urinary tract 04/08/2014  . PID (acute pelvic inflammatory disease) 04/06/2014  . Diabetic ketoacidosis without coma associated with type 1 diabetes mellitus ([place]) 09/19/2013  . Infected cyst of Bartholin's gland duct 09/19/2013  . Bartholin's gland abscess 09/19/2013  . Sepsis ([place]) 09/19/2013  . Abscess, gluteal, right 08/24/2013  . Health care maintenance 08/24/2013  . Hypophosphatemia 08/18/2013  . Hypomagnesemia 08/18/2013  . Leukocytosis, unspecified 08/17/2013  . Fever, unspecified 08/17/2013  . H/O medication noncompliance 08/12/2013  . Hyponatremia 08/04/2012  . Anemia 02/19/2012  . Hyperglycemia 11/06/2011  . Sinus tachycardia ([place]) 10/17/2011  . Diabetes mellitus type 1, uncontrolled, with complications ([place]) 35/36/1443    Past Surgical History:  Procedure Laterality Date  . INCISION AND DRAINAGE PERIRECTAL ABSCESS Right 08/18/2013   Procedure: IRRIGATION AND DEBRIDEMENT GLUTEAL ABSCESS;  Surgeon: Ralene Ok, MD;  Location: [facility];  Service: General;  Laterality: Right;  . INCISION AND DRAINAGE PERIRECTAL ABSCESS Right 09/19/2013   Procedure: IRRIGATION AND DEBRIDEMENT RIGHT GLUTEAL AND LABIAL ABSCESSES;  Surgeon: Ralene Ok, MD;  Location: [facility];  Service: General;  Laterality: Right;  . INCISION AND DRAINAGE PERIRECTAL ABSCESS Right 09/24/2013   Procedure: IRRIGATION AND DEBRIDEMENT PERIRECTAL ABSCESS;  Surgeon: Gwenyth Ober, MD;  Location: [facility];  Service: General;  Laterality: Right;  . NO PAST SURGERIES      OB History    Gravida Para Term Preterm AB Living   2 0     1 0   SAB TAB Ectopic Multiple Live Births  1               Home Medications    Prior to Admission medications   Medication Sig Start Date End Date Taking? Authorizing Provider  benzonatate (TESSALON) 100 MG capsule Take 1 capsule (100 mg total) by mouth 3 (three) times daily as needed for cough. 09/10/16   Ward, Ozella Almond, PA-C    DM-Doxylamine-Acetaminophen (NIGHTTIME COLD/FLU RELIEF) 15-6.25-325 MG/15ML LIQD Take by mouth 2 (two) times daily as needed (for cold).    [provider]  glucose blood (TRUE METRIX BLOOD GLUCOSE TEST) test strip Use 3 times daily before meals 01/16/16   Arnoldo Morale, MD  insulin aspart (NOVOLOG FLEXPEN) 100 UNIT/ML FlexPen Inject 0-12 Units into the skin 3 (three) times daily with meals. As per sliding scale 01/16/16   Arnoldo Morale, MD  Insulin Glargine (LANTUS) 100 UNIT/ML Solostar Pen Inject 32 Units into the skin at bedtime. Patient taking differently: Inject 20 Units into the skin at bedtime.  06/03/16   Velvet Bathe, MD    Family History Family History  Problem Relation Age of Onset  . Asthma Mother   . Gout Father   . Diabetes Paternal Grandmother   . Anesthesia problems Neg Hx     Social History Social History  Substance Use Topics  . Smoking status: Never Smoker  . Smokeless tobacco: Never Used  . Alcohol use No     Comment: occ     Allergies   Penicillins and Benadryl [diphenhydramine]   Review of Systems Review of Systems  Constitutional: Positive for chills and fatigue. Negative for fever.  HENT: Positive for congestion and rhinorrhea.   Respiratory: Positive for cough.   Gastrointestinal: Positive for vomiting (post-tussive only). Negative for nausea.     Physical Exam Updated Vital Signs BP 108/73 (BP Location: Right Arm)   Pulse (!) 108   Temp 98.9 F (37.2 C) (Oral)   Resp 18   LMP 08/28/2016   SpO2 100%   Physical Exam  Constitutional: She appears well-developed and well-nourished. No distress.  HENT:  Head: Normocephalic and atraumatic.  OP with no erythema, exudates or tonsillar hypertrophy. + PND. No focal areas of sinus tenderness.  Neck: Neck supple.  Cardiovascular: Normal rate, regular rhythm and normal heart sounds.   No murmur heard. Pulmonary/Chest: Effort normal and breath sounds normal. No respiratory distress.  Lungs  clear to auscultation bilaterally. No w/r/r.  Musculoskeletal: Normal range of motion.  Neurological: She is alert.  Skin: Skin is warm and dry.  Nursing note and vitals reviewed.    ED Treatments / Results  DIAGNOSTIC STUDIES: Oxygen Saturation is 100% on RA, normal by my interpretation.   COORDINATION OF CARE: 6:09 PM-Discussed next steps with pt. Pt verbalized understanding and is agreeable with the plan. Will rx Tessalon.   Labs (all labs ordered are listed, but only abnormal results are displayed) Labs Reviewed - No data to display  EKG  EKG Interpretation None       Radiology No results found.  Procedures Procedures (including critical care time)  Medications Ordered in ED Medications - No data to display   Initial Impression / Assessment and Plan / ED Course  I have reviewed the triage vital signs and the nursing notes.  Pertinent labs & imaging results that were available during my care of the patient were reviewed by me and considered in my medical decision making (see chart for details).    Zonie Crutcher [place] is a 26 y.o.  female who presents to ED for persistent cough and sore throat x 1 week.  On exam, patient is afebrile, non-toxic appearing with a clear lung exam. OP with no erythema, exudates or tonsillar hypertrophy.   Sxs today likely due to viral URI.Symptomatic home care instructions discussed. Rx for Tessalon given. Encouraged flonase and zyrtec. PCP follow up strongly encouraged if symptoms persist. Reasons to return to ER discussed. All questions answered.   Blood pressure 110/78, pulse 74, temperature 98.9 F (37.2 C), temperature source Oral, resp. rate 17, last menstrual period 08/28/2016, SpO2 98 %.   Final Clinical Impressions(s) / ED Diagnoses   Final diagnoses:  Viral URI with cough    New Prescriptions New Prescriptions   BENZONATATE (TESSALON) 100 MG CAPSULE    Take 1 capsule (100 mg total) by mouth 3 (three) times daily as  needed for cough.    I personally performed the services described in this documentation, which was scribed in my presence. The recorded information has been reviewed and is accurate.     Ward, Ozella Almond, PA-C 09/10/16 1901    Varney Biles, MD 09/10/16 2134

## 2016-10-14 ENCOUNTER — Encounter (HOSPITAL_COMMUNITY): Payer: Self-pay | Admitting: Emergency Medicine

## 2016-10-14 ENCOUNTER — Inpatient Hospital Stay (HOSPITAL_COMMUNITY)
Admission: EM | Admit: 2016-10-14 | Discharge: 2016-10-16 | DRG: 638 | Disposition: A | Discharge status: Home / Self Care | Payer: Self-pay | Attending: Internal Medicine | Admitting: Internal Medicine

## 2016-10-14 ENCOUNTER — Observation Stay (HOSPITAL_COMMUNITY): Payer: Self-pay

## 2016-10-14 DIAGNOSIS — T383X6A Underdosing of insulin and oral hypoglycemic [antidiabetic] drugs, initial encounter: Secondary | ICD-10-CM | POA: Diagnosis present

## 2016-10-14 DIAGNOSIS — E871 Hypo-osmolality and hyponatremia: Secondary | ICD-10-CM

## 2016-10-14 DIAGNOSIS — Z9114 Patient's other noncompliance with medication regimen: Secondary | ICD-10-CM

## 2016-10-14 DIAGNOSIS — E1065 Type 1 diabetes mellitus with hyperglycemia: Secondary | ICD-10-CM | POA: Diagnosis present

## 2016-10-14 DIAGNOSIS — Z833 Family history of diabetes mellitus: Secondary | ICD-10-CM

## 2016-10-14 DIAGNOSIS — E861 Hypovolemia: Secondary | ICD-10-CM | POA: Diagnosis present

## 2016-10-14 DIAGNOSIS — E1010 Type 1 diabetes mellitus with ketoacidosis without coma: Principal | ICD-10-CM | POA: Diagnosis present

## 2016-10-14 DIAGNOSIS — IMO0002 Reserved for concepts with insufficient information to code with codable children: Secondary | ICD-10-CM

## 2016-10-14 DIAGNOSIS — R059 Cough, unspecified: Secondary | ICD-10-CM

## 2016-10-14 DIAGNOSIS — R197 Diarrhea, unspecified: Secondary | ICD-10-CM | POA: Diagnosis present

## 2016-10-14 DIAGNOSIS — K529 Noninfective gastroenteritis and colitis, unspecified: Secondary | ICD-10-CM | POA: Diagnosis present

## 2016-10-14 DIAGNOSIS — E860 Dehydration: Secondary | ICD-10-CM | POA: Diagnosis present

## 2016-10-14 DIAGNOSIS — R739 Hyperglycemia, unspecified: Secondary | ICD-10-CM | POA: Diagnosis present

## 2016-10-14 DIAGNOSIS — Z880 Allergy status to penicillin: Secondary | ICD-10-CM

## 2016-10-14 DIAGNOSIS — Z888 Allergy status to other drugs, medicaments and biological substances status: Secondary | ICD-10-CM

## 2016-10-14 DIAGNOSIS — Y92009 Unspecified place in unspecified non-institutional (private) residence as the place of occurrence of the external cause: Secondary | ICD-10-CM

## 2016-10-14 DIAGNOSIS — Z794 Long term (current) use of insulin: Secondary | ICD-10-CM

## 2016-10-14 DIAGNOSIS — E108 Type 1 diabetes mellitus with unspecified complications: Secondary | ICD-10-CM

## 2016-10-14 DIAGNOSIS — R05 Cough: Secondary | ICD-10-CM

## 2016-10-14 DIAGNOSIS — E1110 Type 2 diabetes mellitus with ketoacidosis without coma: Secondary | ICD-10-CM | POA: Diagnosis present

## 2016-10-14 DIAGNOSIS — N179 Acute kidney failure, unspecified: Secondary | ICD-10-CM | POA: Diagnosis present

## 2016-10-14 LAB — GLUCOSE, CAPILLARY
GLUCOSE-CAPILLARY: 133 mg/dL — AB (ref 65–99)
GLUCOSE-CAPILLARY: 206 mg/dL — AB (ref 65–99)
Glucose-Capillary: 101 mg/dL — ABNORMAL HIGH (ref 65–99)
Glucose-Capillary: 122 mg/dL — ABNORMAL HIGH (ref 65–99)
Glucose-Capillary: 137 mg/dL — ABNORMAL HIGH (ref 65–99)
Glucose-Capillary: 223 mg/dL — ABNORMAL HIGH (ref 65–99)

## 2016-10-14 LAB — CBC WITH DIFFERENTIAL/PLATELET
Basophils Absolute: 0 10*3/uL (ref 0.0–0.1)
Basophils Relative: 0 %
Eosinophils Absolute: 0.1 10*3/uL (ref 0.0–0.7)
Eosinophils Relative: 1 %
HCT: 40 % (ref 36.0–46.0)
Hemoglobin: 13.1 g/dL (ref 12.0–15.0)
LYMPHS ABS: 3 10*3/uL (ref 0.7–4.0)
LYMPHS PCT: 19 %
MCH: 27.6 pg (ref 26.0–34.0)
MCHC: 32.8 g/dL (ref 30.0–36.0)
MCV: 84.2 fL (ref 78.0–100.0)
Monocytes Absolute: 1.1 10*3/uL — ABNORMAL HIGH (ref 0.1–1.0)
Monocytes Relative: 7 %
Neutro Abs: 11.5 10*3/uL — ABNORMAL HIGH (ref 1.7–7.7)
Neutrophils Relative %: 73 %
Platelets: 345 10*3/uL (ref 150–400)
RBC: 4.75 MIL/uL (ref 3.87–5.11)
RDW: 13.2 % (ref 11.5–15.5)
WBC: 15.9 10*3/uL — AB (ref 4.0–10.5)

## 2016-10-14 LAB — CBG MONITORING, ED
GLUCOSE-CAPILLARY: 279 mg/dL — AB (ref 65–99)
Glucose-Capillary: >600 mg/dL (ref 65–99)
Glucose-Capillary: 497 mg/dL — ABNORMAL HIGH (ref 65–99)
Glucose-Capillary: 308 mg/dL — ABNORMAL HIGH (ref 65–99)
Glucose-Capillary: 179 mg/dL — ABNORMAL HIGH (ref 65–99)
Glucose-Capillary: 209 mg/dL — ABNORMAL HIGH (ref 65–99)
Glucose-Capillary: 170 mg/dL — ABNORMAL HIGH (ref 65–99)

## 2016-10-14 LAB — I-STAT VENOUS BLOOD GAS, ED
ACID-BASE DEFICIT: 20 mmol/L — AB (ref 0.0–2.0)
Bicarbonate: 6.2 mmol/L — ABNORMAL LOW (ref 20.0–28.0)
O2 Saturation: 92 %
PO2 VEN: 78 mmHg — AB (ref 32.0–45.0)
TCO2: 7 mmol/L (ref 0–100)
pCO2, Ven: 17.6 mmHg — CL (ref 44.0–60.0)
pH, Ven: 7.157 — CL (ref 7.250–7.430)

## 2016-10-14 LAB — BASIC METABOLIC PANEL
ANION GAP: 5 (ref 5–15)
ANION GAP: 9 (ref 5–15)
Anion gap: 16 — ABNORMAL HIGH (ref 5–15)
BUN: 19 mg/dL (ref 6–20)
BUN: 18 mg/dL (ref 6–20)
BUN: 13 mg/dL (ref 6–20)
BUN: 12 mg/dL (ref 6–20)
CALCIUM: 8.3 mg/dL — AB (ref 8.9–10.3)
CHLORIDE: 95 mmol/L — AB (ref 101–111)
CHLORIDE: 111 mmol/L (ref 101–111)
CHLORIDE: 114 mmol/L — AB (ref 101–111)
CO2: 10 mmol/L — ABNORMAL LOW (ref 22–32)
CO2: 17 mmol/L — ABNORMAL LOW (ref 22–32)
CO2: 14 mmol/L — ABNORMAL LOW (ref 22–32)
CREATININE: 1.62 mg/dL — AB (ref 0.44–1.00)
CREATININE: 1.37 mg/dL — AB (ref 0.44–1.00)
CREATININE: 0.89 mg/dL (ref 0.44–1.00)
CREATININE: 0.88 mg/dL (ref 0.44–1.00)
Calcium: 10 mg/dL (ref 8.9–10.3)
Calcium: 9 mg/dL (ref 8.9–10.3)
Calcium: 8.7 mg/dL — ABNORMAL LOW (ref 8.9–10.3)
Chloride: 110 mmol/L (ref 101–111)
GFR calc Af Amer: 51 mL/min — ABNORMAL LOW (ref >60)
GFR calc Af Amer: >60 mL/min (ref >60)
GFR calc non Af Amer: 44 mL/min — ABNORMAL LOW (ref >60)
GFR calc non Af Amer: 53 mL/min — ABNORMAL LOW (ref >60)
GFR calc non Af Amer: >60 mL/min (ref >60)
GFR calc non Af Amer: >60 mL/min (ref >60)
Glucose, Bld: 781 mg/dL (ref 65–99)
Glucose, Bld: 312 mg/dL — ABNORMAL HIGH (ref 65–99)
Glucose, Bld: 131 mg/dL — ABNORMAL HIGH (ref 65–99)
Glucose, Bld: 206 mg/dL — ABNORMAL HIGH (ref 65–99)
POTASSIUM: 4.4 mmol/L (ref 3.5–5.1)
Potassium: 5 mmol/L (ref 3.5–5.1)
Potassium: 3.7 mmol/L (ref 3.5–5.1)
Potassium: 4.1 mmol/L (ref 3.5–5.1)
SODIUM: 136 mmol/L (ref 135–145)
SODIUM: 133 mmol/L — AB (ref 135–145)
Sodium: 128 mmol/L — ABNORMAL LOW (ref 135–145)
Sodium: 137 mmol/L (ref 135–145)

## 2016-10-14 LAB — HEPATIC FUNCTION PANEL
ALT: 28 U/L (ref 14–54)
AST: 28 U/L (ref 15–41)
Albumin: 4.2 g/dL (ref 3.5–5.0)
Alkaline Phosphatase: 77 U/L (ref 38–126)
BILIRUBIN DIRECT: 0.1 mg/dL (ref 0.1–0.5)
Indirect Bilirubin: 1.6 mg/dL — ABNORMAL HIGH (ref 0.3–0.9)
TOTAL PROTEIN: 8.1 g/dL (ref 6.5–8.1)
Total Bilirubin: 1.7 mg/dL — ABNORMAL HIGH (ref 0.3–1.2)

## 2016-10-14 LAB — HCG, QUANTITATIVE, PREGNANCY: hCG, Beta Chain, Quant, S: <1 m[IU]/mL (ref <5)

## 2016-10-14 LAB — MRSA PCR SCREENING: MRSA by PCR: NEGATIVE

## 2016-10-14 LAB — I-STAT BETA HCG BLOOD, ED (MC, WL, AP ONLY): I-stat hCG, quantitative: 7.8 m[IU]/mL — ABNORMAL HIGH (ref <5)

## 2016-10-14 MED ORDER — SODIUM CHLORIDE 0.9% FLUSH
3.0000 mL | Freq: Two times a day (BID) | INTRAVENOUS | Status: DC
  Administered 2016-10-14 – 2016-10-16 (×3): 3 mL via INTRAVENOUS

## 2016-10-14 MED ORDER — ONDANSETRON HCL 4 MG/2ML IJ SOLN
4.0000 mg | Freq: Three times a day (TID) | INTRAMUSCULAR | Status: DC | PRN
  Administered 2016-10-14 – 2016-10-15 (×2): 4 mg via INTRAVENOUS
  Filled 2016-10-14 (×2): qty 2

## 2016-10-14 MED ORDER — ONDANSETRON HCL 4 MG/2ML IJ SOLN
4.0000 mg | Freq: Once | INTRAMUSCULAR | Status: AC
  Administered 2016-10-14: 4 mg via INTRAVENOUS
  Filled 2016-10-14: qty 2

## 2016-10-14 MED ORDER — MORPHINE SULFATE (PF) 4 MG/ML IV SOLN
4.0000 mg | Freq: Once | INTRAVENOUS | Status: AC
  Administered 2016-10-14: 4 mg via INTRAVENOUS
  Filled 2016-10-14: qty 1

## 2016-10-14 MED ORDER — SODIUM CHLORIDE 0.9 % IV SOLN: INTRAVENOUS | Status: DC

## 2016-10-14 MED ORDER — PNEUMOCOCCAL VAC POLYVALENT 25 MCG/0.5ML IJ INJ: 0.5000 mL | INJECTION | INTRAMUSCULAR | Status: DC

## 2016-10-14 MED ORDER — SODIUM CHLORIDE 0.9 % IV BOLUS (SEPSIS)
2000.0000 mL | Freq: Once | INTRAVENOUS | Status: AC
  Administered 2016-10-14: 2000 mL via INTRAVENOUS

## 2016-10-14 MED ORDER — INSULIN REGULAR BOLUS VIA INFUSION
0.0000 [IU] | Freq: Three times a day (TID) | INTRAVENOUS | Status: DC
  Filled 2016-10-14: qty 10

## 2016-10-14 MED ORDER — DEXTROSE 50 % IV SOLN: 25.0000 mL | INTRAVENOUS | Status: DC | PRN

## 2016-10-14 MED ORDER — ENOXAPARIN SODIUM 30 MG/0.3ML ~~LOC~~ SOLN
30.0000 mg | SUBCUTANEOUS | Status: DC
  Filled 2016-10-14 (×2): qty 0.3

## 2016-10-14 MED ORDER — DEXTROSE-NACL 5-0.45 % IV SOLN
INTRAVENOUS | Status: DC
  Administered 2016-10-14: 15:00:00 via INTRAVENOUS

## 2016-10-14 MED ORDER — SODIUM CHLORIDE 0.9 % IV SOLN
INTRAVENOUS | Status: DC
  Administered 2016-10-14: 08:00:00 via INTRAVENOUS
  Filled 2016-10-14: qty 1

## 2016-10-14 MED ORDER — ACETAMINOPHEN 325 MG PO TABS
650.0000 mg | ORAL_TABLET | Freq: Four times a day (QID) | ORAL | Status: DC | PRN
  Administered 2016-10-14: 650 mg via ORAL
  Filled 2016-10-14: qty 2

## 2016-10-14 MED ORDER — SODIUM CHLORIDE 0.9 % IV BOLUS (SEPSIS)
1000.0000 mL | Freq: Once | INTRAVENOUS | Status: AC
  Administered 2016-10-14: 1000 mL via INTRAVENOUS

## 2016-10-14 NOTE — ED Triage Notes (Signed)
Per EMS, pt is a type 1 diabetic who believes she is in DKA.  Her CBG is over 600.  Pt complains of weakness and nausea for three days.  Is not compliant w/ medications.

## 2016-10-14 NOTE — ED Provider Notes (Signed)
[facility] DEPT Provider Note   CSN: [id number] Arrival date & time: 10/14/16  7262     History   Chief Complaint Chief Complaint  Patient presents with  . Hyperglycemia    HPI    Blood pressure (!) 146/97, pulse (!) 131, temperature 98 F (36.7 C), temperature source Oral, resp. rate 18, last menstrual period 03/14/2016, SpO2 100 %.  Ashley Freeman is a 26 y.o. female type I diabetic brought in by EMS for possible DKA. States that she has been nauseous and started vomiting this morning sugars have been reading high since yesterday. She states that she is compliant with her Lantus and NovoLog. States she's been in her normal state of health eating and drinking normally with no fever, cough, chills or any other inciting incident. She states that she aches all over her body. She states that she's been sleeping for the last 24 hours. Reduced urinary output. Feels like prior episodes of DKA. She has no focal abdominal or chest pain.   Past Medical History:  Diagnosis Date  . Diabetes mellitus 2001   Diagnosed at age 65 ; Type I  . Diarrhea 05/30/2016  . DKA (diabetic ketoacidoses) ([place]) 08/19/2013  . DKA (diabetic ketoacidoses) ([place]) 05/30/2016  . Gonorrhea 08/2011   Treated in 09/2011    Patient Active Problem List   Diagnosis Date Noted  . Pregnancy of unknown anatomic location 06/25/2016  . Abdominal pain in pregnancy, antepartum 06/25/2016  . Possible pregnancy 05/31/2016  . History of trichomoniasis 05/31/2016  . Diarrhea 05/31/2016  . DKA (diabetic ketoacidoses) ([place]) 05/30/2016  . Hyperlipidemia 03/28/2016  . Diabetic neuropathy ([place]) 01/16/2016  . DKA, type 1 ([place]) 11/24/2015  . BV (bacterial vaginosis) 11/24/2015  . Chest pain 11/24/2015  . AKI (acute kidney injury) ([place]) 07/26/2014  . Infection of urinary tract 04/08/2014  . PID (acute pelvic inflammatory disease) 04/06/2014  . Diabetic ketoacidosis without coma associated with type 1 diabetes mellitus  ([place]) 09/19/2013  . Infected cyst of Bartholin's gland duct 09/19/2013  . Bartholin's gland abscess 09/19/2013  . Sepsis ([place]) 09/19/2013  . Abscess, gluteal, right 08/24/2013  . Health care maintenance 08/24/2013  . Hypophosphatemia 08/18/2013  . Hypomagnesemia 08/18/2013  . Leukocytosis, unspecified 08/17/2013  . Fever, unspecified 08/17/2013  . H/O medication noncompliance 08/12/2013  . Hyponatremia 08/04/2012  . Anemia 02/19/2012  . Hyperglycemia 11/06/2011  . Sinus tachycardia ([place]) 10/17/2011  . Diabetes mellitus type 1, uncontrolled, with complications ([place]) 03/55/9741    Past Surgical History:  Procedure Laterality Date  . INCISION AND DRAINAGE PERIRECTAL ABSCESS Right 08/18/2013   Procedure: IRRIGATION AND DEBRIDEMENT GLUTEAL ABSCESS;  Surgeon: Ralene Ok, MD;  Location: [facility];  Service: General;  Laterality: Right;  . INCISION AND DRAINAGE PERIRECTAL ABSCESS Right 09/19/2013   Procedure: IRRIGATION AND DEBRIDEMENT RIGHT GLUTEAL AND LABIAL ABSCESSES;  Surgeon: Ralene Ok, MD;  Location: [facility];  Service: General;  Laterality: Right;  . INCISION AND DRAINAGE PERIRECTAL ABSCESS Right 09/24/2013   Procedure: IRRIGATION AND DEBRIDEMENT PERIRECTAL ABSCESS;  Surgeon: Gwenyth Ober, MD;  Location: [facility];  Service: General;  Laterality: Right;  . NO PAST SURGERIES      OB History    Gravida Para Term Preterm AB Living   2 0     1 0   SAB TAB Ectopic Multiple Live Births   1               Home Medications    Prior to Admission medications  Medication Sig Start Date End Date Taking? Authorizing Provider  insulin aspart (NOVOLOG FLEXPEN) 100 UNIT/ML FlexPen Inject 0-12 Units into the skin 3 (three) times daily with meals. As per sliding scale 01/16/16  Yes Amao, Charlane Ferretti, MD  Insulin Glargine (LANTUS) 100 UNIT/ML Solostar Pen Inject 32 Units into the skin at bedtime. Patient taking differently: Inject 20 Units into the skin at bedtime.  06/03/16  Yes Velvet Bathe, MD    benzonatate (TESSALON) 100 MG capsule Take 1 capsule (100 mg total) by mouth 3 (three) times daily as needed for cough. Patient not taking: Reported on 10/14/2016 09/10/16   Ward, Ozella Almond, PA-C  glucose blood (TRUE METRIX BLOOD GLUCOSE TEST) test strip Use 3 times daily before meals 01/16/16   Arnoldo Morale, MD    Family History Family History  Problem Relation Age of Onset  . Asthma Mother   . Gout Father   . Diabetes Paternal Grandmother   . Anesthesia problems Neg Hx     Social History Social History  Substance Use Topics  . Smoking status: Never Smoker  . Smokeless tobacco: Never Used  . Alcohol use No     Comment: occ     Allergies   Penicillins and Benadryl [diphenhydramine]   Review of Systems Review of Systems  A complete review of systems was obtained and all systems are negative except as noted in the HPI and PMH.    Physical Exam Updated Vital Signs BP 103/65   Pulse (!) 118   Temp 98 F (36.7 C) (Oral)   Resp 18   LMP 09/28/2016 (Approximate)   SpO2 99%   Physical Exam  Constitutional: She is oriented to person, place, and time. She appears well-developed and well-nourished. No distress.  HENT:  Head: Normocephalic and atraumatic.  Mouth/Throat: Oropharynx is clear and moist.  Eyes: Conjunctivae and EOM are normal. Pupils are equal, round, and reactive to light.  Neck: Normal range of motion.  Cardiovascular: Normal rate, regular rhythm and intact distal pulses.   Tachycardic, regular  Pulmonary/Chest: Breath sounds normal.  Positive Kussmaul breathing  Abdominal: Soft. There is no tenderness.  No focal tenderness  Musculoskeletal: Normal range of motion.  Neurological: She is alert and oriented to person, place, and time.  Skin: She is not diaphoretic.  Psychiatric: She has a normal mood and affect.  Nursing note and vitals reviewed.    ED Treatments / Results  Labs (all labs ordered are listed, but only abnormal results are  displayed) Labs Reviewed  BASIC METABOLIC PANEL - Abnormal; Notable for the following:       Result Value   Sodium 128 (*)    Chloride 95 (*)    CO2 <7 (*)    Glucose, Bld 781 (*)    Creatinine, Ser 1.62 (*)    GFR calc non Af Amer 44 (*)    GFR calc Af Amer 51 (*)    All other components within normal limits  CBC WITH DIFFERENTIAL/PLATELET - Abnormal; Notable for the following:    WBC 15.9 (*)    Neutro Abs 11.5 (*)    Monocytes Absolute 1.1 (*)    All other components within normal limits  HEPATIC FUNCTION PANEL - Abnormal; Notable for the following:    Total Bilirubin 1.7 (*)    Indirect Bilirubin 1.6 (*)    All other components within normal limits  CBG MONITORING, ED - Abnormal; Notable for the following:    Glucose-Capillary >600 (*)    All other  components within normal limits  I-STAT BETA HCG BLOOD, ED ([facility], [facility], [facility] ONLY) - Abnormal; Notable for the following:    I-stat hCG, quantitative 7.8 (*)    All other components within normal limits  I-STAT VENOUS BLOOD GAS, ED - Abnormal; Notable for the following:    pH, Ven 7.157 (*)    pCO2, Ven 17.6 (*)    pO2, Ven 78.0 (*)    Bicarbonate 6.2 (*)    Acid-base deficit 20.0 (*)    All other components within normal limits  CBG MONITORING, ED - Abnormal; Notable for the following:    Glucose-Capillary 497 (*)    All other components within normal limits  HCG, QUANTITATIVE, PREGNANCY  URINALYSIS, ROUTINE W REFLEX MICROSCOPIC  BLOOD GAS, VENOUS  BASIC METABOLIC PANEL  BASIC METABOLIC PANEL  BASIC METABOLIC PANEL  BASIC METABOLIC PANEL  HEMOGLOBIN A1C    EKG  EKG Interpretation None       Radiology No results found.  Procedures Procedures (including critical care time)  Medications Ordered in ED Medications  sodium chloride 0.9 % bolus 1,000 mL (not administered)  dextrose 5 %-0.45 % sodium chloride infusion (not administered)  insulin regular bolus via infusion 0-10 Units (not administered)  insulin  regular (NOVOLIN R,HUMULIN R) 100 Units in sodium chloride 0.9 % 100 mL (1 Units/mL) infusion (not administered)  dextrose 50 % solution 25 mL (not administered)  enoxaparin (LOVENOX) injection 30 mg (not administered)  sodium chloride flush (NS) 0.9 % injection 3 mL (not administered)  0.9 %  sodium chloride infusion (not administered)  ondansetron (ZOFRAN) injection 4 mg (not administered)  sodium chloride 0.9 % bolus 2,000 mL (2,000 mLs Intravenous New Bag/Given 10/14/16 0754)  ondansetron (ZOFRAN) injection 4 mg (4 mg Intravenous Given 10/14/16 0812)  morphine 4 MG/ML injection 4 mg (4 mg Intravenous Given 10/14/16 9629)     Initial Impression / Assessment and Plan / ED Course  I have reviewed the triage vital signs and the nursing notes.  Pertinent labs & imaging results that were available during my care of the patient were reviewed by me and considered in my medical decision making (see chart for details).     Vitals:   10/14/16 0900 10/14/16 0915 10/14/16 1000 10/14/16 1015  BP: (!) 118/56 115/60 105/64 103/65  Pulse: (!) 122 (!) 121 (!) 116 (!) 118  Resp:      Temp:      TempSrc:      SpO2: 99% 99% 99% 99%    Medications  sodium chloride 0.9 % bolus 1,000 mL (not administered)  dextrose 5 %-0.45 % sodium chloride infusion (not administered)  insulin regular bolus via infusion 0-10 Units (not administered)  insulin regular (NOVOLIN R,HUMULIN R) 100 Units in sodium chloride 0.9 % 100 mL (1 Units/mL) infusion (not administered)  dextrose 50 % solution 25 mL (not administered)  enoxaparin (LOVENOX) injection 30 mg (not administered)  sodium chloride flush (NS) 0.9 % injection 3 mL (not administered)  0.9 %  sodium chloride infusion (not administered)  ondansetron (ZOFRAN) injection 4 mg (not administered)  sodium chloride 0.9 % bolus 2,000 mL (2,000 mLs Intravenous New Bag/Given 10/14/16 0754)  ondansetron (ZOFRAN) injection 4 mg (4 mg Intravenous Given 10/14/16 0812)    morphine 4 MG/ML injection 4 mg (4 mg Intravenous Given 10/14/16 0812)    Claudette Laws Freeman is 26 y.o. female presenting with Elevated blood sugar, significantly tachycardic with 2 small bleeding breathing. Patient likely a. She states that she's  been compliant with her insulin, there was no inciting illness. VBG with a pH of 7.1. Glucose stabilizer initiated.  This case with Dr. Daleen Bo, this is an unassigned admission to [facility] hospitalist    Final Clinical Impressions(s) / ED Diagnoses   Final diagnoses:  Diabetic ketoacidosis without coma associated with type 1 diabetes mellitus [facility])    New Prescriptions New Prescriptions   No medications on file     Waynetta Pean 10/14/16 [place], MD 10/14/16 2058

## 2016-10-14 NOTE — ED Notes (Signed)
Attempted to call report x 1  

## 2016-10-14 NOTE — H&P (Signed)
[facility] History and Physical  Ashley Freeman GNF:[id number] DOB: [birth year] DOA: 10/14/2016  Referring physician:  PCP: Arnoldo Morale, MD  Specialists:   Chief Complaint: diffuse muscle pain, fatigue   HPI: Ashley Freeman is a 26 y.o. female with PMH of DM, h/o DKAs, ? compliance presented with generalized fatigue, weakness, poor appetites associated with mild nausea, vomiting, abdominal discomfort. She denies acute abdominal pains, no hematemesis. She states that she has chronic diarrhea with 2-3 bowel movement per day. No blood in stool. She also reports mild non productive cough, no fever, no wheezing, no acute chest pains or SOB. She reports recent h/o upper respiratory infection.  -ED: patient is found to be in DKA.   Review of Systems: The patient denies anorexia, fever, weight loss,, vision loss, decreased hearing, hoarseness, chest pain, syncope, dyspnea on exertion, peripheral edema, balance deficits, hemoptysis, abdominal pain, melena, hematochezia, severe indigestion/heartburn, hematuria, incontinence, genital sores, muscle weakness, suspicious skin lesions, transient blindness, difficulty walking, depression, unusual weight change, abnormal bleeding, enlarged lymph nodes, angioedema, and breast masses.    Past Medical History:  Diagnosis Date  . Diabetes mellitus 2001   Diagnosed at age 57 ; Type I  . Diarrhea 05/30/2016  . DKA (diabetic ketoacidoses) ([place]) 08/19/2013  . DKA (diabetic ketoacidoses) ([place]) 05/30/2016  . Gonorrhea 08/2011   Treated in 09/2011   Past Surgical History:  Procedure Laterality Date  . INCISION AND DRAINAGE PERIRECTAL ABSCESS Right 08/18/2013   Procedure: IRRIGATION AND DEBRIDEMENT GLUTEAL ABSCESS;  Surgeon: Ralene Ok, MD;  Location: [place];  Service: General;  Laterality: Right;  . INCISION AND DRAINAGE PERIRECTAL ABSCESS Right 09/19/2013   Procedure: IRRIGATION AND DEBRIDEMENT RIGHT GLUTEAL AND LABIAL ABSCESSES;  Surgeon:  Ralene Ok, MD;  Location: [place];  Service: General;  Laterality: Right;  . INCISION AND DRAINAGE PERIRECTAL ABSCESS Right 09/24/2013   Procedure: IRRIGATION AND DEBRIDEMENT PERIRECTAL ABSCESS;  Surgeon: Gwenyth Ober, MD;  Location: [place];  Service: General;  Laterality: Right;  . NO PAST SURGERIES     Social History:  reports that she has never smoked. She has never used smokeless tobacco. She reports that she does not drink alcohol or use drugs. Home;  where does patient live--home, ALF, SNF? and with whom if at home? Yes;  Can patient participate in ADLs?  Allergies  Allergen Reactions  . Penicillins Hives and Rash    Has patient had a PCN reaction causing immediate rash, facial/tongue/throat swelling, SOB or lightheadedness with hypotension: Yes Has patient had a PCN reaction causing severe rash involving mucus membranes or skin necrosis: No Has patient had a PCN reaction that required hospitalization: Yes Has patient had a PCN reaction occurring within the last 10 years: No If all of the above answers are "NO", then may proceed with Cephalosporin use.  . Benadryl [Diphenhydramine] Itching    Family History  Problem Relation Age of Onset  . Asthma Mother   . Gout Father   . Diabetes Paternal Grandmother   . Anesthesia problems Neg Hx     (be sure to complete)  Prior to Admission medications   Medication Sig Start Date End Date Taking? Authorizing Provider  insulin aspart (NOVOLOG FLEXPEN) 100 UNIT/ML FlexPen Inject 0-12 Units into the skin 3 (three) times daily with meals. As per sliding scale 01/16/16  Yes Amao, Charlane Ferretti, MD  Insulin Glargine (LANTUS) 100 UNIT/ML Solostar Pen Inject 32 Units into the skin at bedtime. Patient taking differently: Inject 20 Units into the skin at  bedtime.  06/03/16  Yes Velvet Bathe, MD  benzonatate (TESSALON) 100 MG capsule Take 1 capsule (100 mg total) by mouth 3 (three) times daily as needed for cough. Patient not taking: Reported on  10/14/2016 09/10/16   Ward, Ozella Almond, PA-C  glucose blood (TRUE METRIX BLOOD GLUCOSE TEST) test strip Use 3 times daily before meals 01/16/16   Arnoldo Morale, MD   Physical Exam: Vitals:   10/14/16 1000 10/14/16 1015  BP: 105/64 103/65  Pulse: (!) 116 (!) 118  Resp:    Temp:       General:  Alert, oriented. Mild tachycardia   Eyes: eom-I, perrla   ENT: no oral ulcers   Neck: supple   Cardiovascular: s1,s2 tachycardia   Respiratory: CTA BL  Abdomen: soft, nt, nd   Skin: no rash   Musculoskeletal: no leg edema   Psychiatric: no hallucinations   Neurologic: CN 2-12 intact. Motor 5/5 BL symmetric   Labs on Admission:  Basic Metabolic Panel:  Recent Labs Lab 10/14/16 0723  NA 128*  K 5.0  CL 95*  CO2 <7*  GLUCOSE 781*  BUN 19  CREATININE 1.62*  CALCIUM 10.0   Liver Function Tests:  Recent Labs Lab 10/14/16 0723  AST 28  ALT 28  ALKPHOS 77  BILITOT 1.7*  PROT 8.1  ALBUMIN 4.2   No results for input(s): LIPASE, AMYLASE in the last 168 hours. No results for input(s): AMMONIA in the last 168 hours. CBC:  Recent Labs Lab 10/14/16 0723  WBC 15.9*  NEUTROABS 11.5*  HGB 13.1  HCT 40.0  MCV 84.2  PLT 345   Cardiac Enzymes: No results for input(s): CKTOTAL, CKMB, CKMBINDEX, TROPONINI in the last 168 hours.  BNP (last 3 results) No results for input(s): BNP in the last 8760 hours.  ProBNP (last 3 results) No results for input(s): PROBNP in the last 8760 hours.  CBG:  Recent Labs Lab 10/14/16 0702 10/14/16 0946  GLUCAP >600* 497*    Radiological Exams on Admission: No results found.  EKG: Independently reviewed.   Assessment/Plan Active Problems:   Diabetes mellitus type 1, uncontrolled, with complications (HCC)   Hyperglycemia   Diarrhea  26 y.o. female with PMH of DM, h/o DKAs, ? compliance presented with generalized fatigue, tiredness with gi symptoms. Found to have in DKA.   DKA. Probable non adherence to DM management.  Tachycardia, dehydration, electrolytes abnormalities, anion gap met acidosis, hyponatremia, hypovolemia on admission  -cont iv fluid resuscitation, started on iv insulin protocol, monitor urine output, labs closely  DM. Uncontrolled with DKA. As above, check ha1c. Will transition to sq regimen per protocol   AKI likely due to dehydration, hypovolemia. Cont iv fluids, monitor renal function, I/o Leukocytosis. Likely reactive. Afebrile. No s/s of systemic infection. will check UA. CXR due to recent URI.   Consult CM. Med assistance  None;  if consultant consulted, please document name and whether formally or informally consulted  Code Status: full (must indicate code status--if unknown or must be presumed, indicate so) Family Communication: d/w patient, ED (indicate person spoken with, if applicable, with phone number if by telephone) Disposition Plan: home 1-2 days (indicate anticipated LOS)  Time spent: >45 minutes   Kinnie Feil [facility] Pager [phone]  If 7PM-7AM, please contact night-coverage [email or web address] Password [facility] 10/14/2016, 10:37 AM

## 2016-10-14 NOTE — ED Notes (Addendum)
Pt CBG reads High on the meter. RN Anderson Malta, RN Vicente Males informed. NP Pisciotta in room while CBG being taken.

## 2016-10-15 DIAGNOSIS — E1010 Type 1 diabetes mellitus with ketoacidosis without coma: Principal | ICD-10-CM

## 2016-10-15 DIAGNOSIS — E108 Type 1 diabetes mellitus with unspecified complications: Secondary | ICD-10-CM

## 2016-10-15 DIAGNOSIS — E1065 Type 1 diabetes mellitus with hyperglycemia: Secondary | ICD-10-CM

## 2016-10-15 LAB — BASIC METABOLIC PANEL
ANION GAP: 7 (ref 5–15)
Anion gap: 6 (ref 5–15)
Anion gap: 9 (ref 5–15)
Anion gap: 8 (ref 5–15)
BUN: 11 mg/dL (ref 6–20)
BUN: 11 mg/dL (ref 6–20)
BUN: 6 mg/dL (ref 6–20)
BUN: 7 mg/dL (ref 6–20)
CALCIUM: 8 mg/dL — AB (ref 8.9–10.3)
CHLORIDE: 109 mmol/L (ref 101–111)
CHLORIDE: 105 mmol/L (ref 101–111)
CO2: 20 mmol/L — ABNORMAL LOW (ref 22–32)
CO2: 16 mmol/L — ABNORMAL LOW (ref 22–32)
CO2: 17 mmol/L — ABNORMAL LOW (ref 22–32)
CO2: 18 mmol/L — ABNORMAL LOW (ref 22–32)
CREATININE: 0.75 mg/dL (ref 0.44–1.00)
Calcium: 8.6 mg/dL — ABNORMAL LOW (ref 8.9–10.3)
Calcium: 8.2 mg/dL — ABNORMAL LOW (ref 8.9–10.3)
Calcium: 8.6 mg/dL — ABNORMAL LOW (ref 8.9–10.3)
Chloride: 112 mmol/L — ABNORMAL HIGH (ref 101–111)
Chloride: 111 mmol/L (ref 101–111)
Creatinine, Ser: 0.83 mg/dL (ref 0.44–1.00)
Creatinine, Ser: 0.74 mg/dL (ref 0.44–1.00)
Creatinine, Ser: 0.66 mg/dL (ref 0.44–1.00)
GFR calc Af Amer: >60 mL/min (ref >60)
GFR calc Af Amer: >60 mL/min (ref >60)
GFR calc non Af Amer: >60 mL/min (ref >60)
GFR calc non Af Amer: >60 mL/min (ref >60)
Glucose, Bld: 110 mg/dL — ABNORMAL HIGH (ref 65–99)
Glucose, Bld: 193 mg/dL — ABNORMAL HIGH (ref 65–99)
Glucose, Bld: 173 mg/dL — ABNORMAL HIGH (ref 65–99)
Glucose, Bld: 438 mg/dL — ABNORMAL HIGH (ref 65–99)
POTASSIUM: 3.1 mmol/L — AB (ref 3.5–5.1)
POTASSIUM: 3.6 mmol/L (ref 3.5–5.1)
Potassium: 3.6 mmol/L (ref 3.5–5.1)
Potassium: 4.1 mmol/L (ref 3.5–5.1)
SODIUM: 134 mmol/L — AB (ref 135–145)
SODIUM: 135 mmol/L (ref 135–145)
SODIUM: 131 mmol/L — AB (ref 135–145)
Sodium: 138 mmol/L (ref 135–145)

## 2016-10-15 LAB — CBC
HCT: 34 % — ABNORMAL LOW (ref 36.0–46.0)
Hemoglobin: 11.2 g/dL — ABNORMAL LOW (ref 12.0–15.0)
MCH: 26.9 pg (ref 26.0–34.0)
MCHC: 32.9 g/dL (ref 30.0–36.0)
MCV: 81.5 fL (ref 78.0–100.0)
Platelets: 317 10*3/uL (ref 150–400)
RBC: 4.17 MIL/uL (ref 3.87–5.11)
RDW: 13.4 % (ref 11.5–15.5)
WBC: 14.7 10*3/uL — ABNORMAL HIGH (ref 4.0–10.5)

## 2016-10-15 LAB — GLUCOSE, CAPILLARY
GLUCOSE-CAPILLARY: 140 mg/dL — AB (ref 65–99)
GLUCOSE-CAPILLARY: 99 mg/dL (ref 65–99)
GLUCOSE-CAPILLARY: 102 mg/dL — AB (ref 65–99)
GLUCOSE-CAPILLARY: 164 mg/dL — AB (ref 65–99)
Glucose-Capillary: 102 mg/dL — ABNORMAL HIGH (ref 65–99)
Glucose-Capillary: 123 mg/dL — ABNORMAL HIGH (ref 65–99)
Glucose-Capillary: 180 mg/dL — ABNORMAL HIGH (ref 65–99)
Glucose-Capillary: 187 mg/dL — ABNORMAL HIGH (ref 65–99)
Glucose-Capillary: 189 mg/dL — ABNORMAL HIGH (ref 65–99)
Glucose-Capillary: 191 mg/dL — ABNORMAL HIGH (ref 65–99)
Glucose-Capillary: 197 mg/dL — ABNORMAL HIGH (ref 65–99)
Glucose-Capillary: 426 mg/dL — ABNORMAL HIGH (ref 65–99)

## 2016-10-15 LAB — URINALYSIS, ROUTINE W REFLEX MICROSCOPIC
Bilirubin Urine: NEGATIVE
Glucose, UA: >=500 mg/dL — AB
HGB URINE DIPSTICK: NEGATIVE
Ketones, ur: 80 mg/dL — AB
Nitrite: NEGATIVE
Protein, ur: NEGATIVE mg/dL
Specific Gravity, Urine: 1.018 (ref 1.005–1.030)
pH: 5 (ref 5.0–8.0)

## 2016-10-15 LAB — HEMOGLOBIN A1C: Mean Plasma Glucose: >398 mg/dL

## 2016-10-15 LAB — PHOSPHORUS: Phosphorus: 1.9 mg/dL — ABNORMAL LOW (ref 2.5–4.6)

## 2016-10-15 LAB — MAGNESIUM: Magnesium: 1.6 mg/dL — ABNORMAL LOW (ref 1.7–2.4)

## 2016-10-15 MED ORDER — INSULIN ASPART 100 UNIT/ML ~~LOC~~ SOLN
0.0000 [IU] | Freq: Three times a day (TID) | SUBCUTANEOUS | Status: DC
  Administered 2016-10-15 – 2016-10-16 (×2): 3 [IU] via SUBCUTANEOUS
  Administered 2016-10-16: 8 [IU] via SUBCUTANEOUS

## 2016-10-15 MED ORDER — POTASSIUM CHLORIDE 10 MEQ/100ML IV SOLN
10.0000 meq | INTRAVENOUS | Status: AC
  Administered 2016-10-15 (×4): 10 meq via INTRAVENOUS
  Filled 2016-10-15 (×2): qty 100

## 2016-10-15 MED ORDER — SODIUM CHLORIDE 0.9 % IV BOLUS (SEPSIS)
1000.0000 mL | Freq: Once | INTRAVENOUS | Status: AC
  Administered 2016-10-15: 1000 mL via INTRAVENOUS

## 2016-10-15 MED ORDER — SODIUM CHLORIDE 0.9 % IV SOLN
INTRAVENOUS | Status: AC
  Administered 2016-10-15: 12:00:00 via INTRAVENOUS

## 2016-10-15 MED ORDER — INSULIN ASPART 100 UNIT/ML ~~LOC~~ SOLN
5.0000 [IU] | SUBCUTANEOUS | Status: AC
  Administered 2016-10-15: 5 [IU] via SUBCUTANEOUS

## 2016-10-15 MED ORDER — INSULIN GLARGINE 100 UNIT/ML ~~LOC~~ SOLN
25.0000 [IU] | Freq: Every day | SUBCUTANEOUS | Status: DC
  Administered 2016-10-15: 25 [IU] via SUBCUTANEOUS
  Filled 2016-10-15 (×2): qty 0.25

## 2016-10-15 NOTE — Progress Notes (Signed)
Inpatient Diabetes Program Recommendations  AACE/ADA: New Consensus Statement on Inpatient Glycemic Control (2015)  Target Ranges:  Prepandial:   less than 140 mg/dL      Peak postprandial:   less than 180 mg/dL (1-2 hours)      Critically ill patients:  140 - 180 mg/dL   Lab Results  Component Value Date   GLUCAP 197 (H) 10/15/2016   HGBA1C >14.0 (H) 01/16/2016    Review of Glycemic Control  Inpatient Diabetes Program Recommendations:    Please consider an order for HgbA1C-last result >14 in 2017  Thank you Rosita Kea, RN, MSN, CDE  Diabetes Inpatient Program Office: [phone] Pager: [phone] 8:00 am to 5:00 pm

## 2016-10-15 NOTE — Progress Notes (Signed)
Inpatient Diabetes Program Recommendations  AACE/ADA: New Consensus Statement on Inpatient Glycemic Control (2015)  Target Ranges:  Prepandial:   less than 140 mg/dL      Peak postprandial:   less than 180 mg/dL (1-2 hours)      Critically ill patients:  140 - 180 mg/dL   Lab Results  Component Value Date   GLUCAP 197 (H) 10/15/2016   HGBA1C >15.5 (H) 10/14/2016    Review of Glycemic Control  Spoke with patient as to potential cause(s) of DKA. Patient states she was not aware she was sick until it finally hit her that she couldn't do anything but call the EMS. She states she has not problem with obtaining her insulins and is in the process of getting her pay stubs to [facility] to obtain her meds there. Noted care management has seen and entered as a medicaid potential. Discussed her regimen at home. She states she gives her lantus daily and her novolog taking (she thinks) 1 unit per 10 grams carbohydrate and a correction dose that begins with 1 unit at 150 mg/dl and increases by 1 unit for every 50 increase in cbg. She states she just doesn't like to check her cbg's but she tries. She states her blood sugars are always high and makes her feel poorly and has had hypoglycemia only occasionally. I emphasized to patient the absolute need to check cbg's and correct. She states she does know how to carbohydrate count.  For home regimen, I would recommend a change in her home correction scale to the sensitive correction tidwc as used here and the HS scale rather than the 1 unit at 150 mg/dL.  She could continue her meal coverage of 1 unit per 10 grams carbohydrate. Lantus at 25 units as presently ordered.  Thank you Rosita Kea, RN, MSN, CDE  Diabetes Inpatient Program Office: [phone] Pager: [phone] 8:00 am to 5:00 pm

## 2016-10-15 NOTE — Progress Notes (Signed)
PROGRESS NOTE    [name]  KGM:[id number] DOB: May 22, 1990 DOA: 10/14/2016 PCP: Arnoldo Morale, MD  Brief Narrative:Ashley Freeman is a 26 y.o. female with PMH of DM, h/o DKAs, ? compliance presented with generalized fatigue, weakness, poor appetites associated with mild nausea, vomiting, abdominal discomfort. Admitted with DKA and AKI  Assessment & Plan: DKA -suspect poor compliance related -improved with Insulin gtt, IVF -bicarb still low but anion gap closed -will start lantus and SSI -start diet -FU Hba1c  AKI -due to dehydration and hypovolemia -resolved, Stop IVF today  Hypomagnesemia -replace  Code Status: full Family Communication: None at bedside Disposition Plan: Tx to floor, home tomorrow    Subjective: Feels better, denies missing meds  Objective: Vitals:   10/15/16 0200 10/15/16 0407 10/15/16 0830 10/15/16 0900  BP: (!) 97/59 (!) 98/52 104/62 115/72  Pulse: (!) 101 (!) 107 (!) 101 (!) 102  Resp: 14 14 15 18   Temp:  98.1 F (36.7 C) 98.2 F (36.8 C)   TempSrc:  Oral Oral   SpO2: 98% 96% 98% 99%  Weight:      Height:        Intake/Output Summary (Last 24 hours) at 10/15/16 1131 Last data filed at 10/15/16 0900  Gross per 24 hour  Intake          2991.96 ml  Output              500 ml  Net          2491.96 ml   Filed Weights   10/14/16 1821  Weight: 54.2 kg (119 lb 7.8 oz)    Examination:  General exam: Appears calm and comfortable  Respiratory system: Clear to auscultation. Respiratory effort normal. Cardiovascular system: S1 & S2 heard, RRR. No JVD, murmurs, rubs, gallops or clicks. No pedal edema. Gastrointestinal system: Abdomen is nondistended, soft and nontender.  Normal bowel sounds heard. Central nervous system: Alert and oriented. No focal neurological deficits. Extremities: Symmetric 5 x 5 power. Skin: No rashes, lesions or ulcers Psychiatry: Judgement and insight appear normal. Mood & affect appropriate.      Data Reviewed:   CBC:  Recent Labs Lab 10/14/16 0723 10/15/16 0246  WBC 15.9* 14.7*  NEUTROABS 11.5*  --   HGB 13.1 11.2*  HCT 40.0 34.0*  MCV 84.2 81.5  PLT 345 644   Basic Metabolic Panel:  Recent Labs Lab 10/14/16 1259 10/14/16 1828 10/14/16 2246 10/15/16 0246 10/15/16 0739  NA 137 136 133* 138 134*  K 4.4 3.7 4.1 3.1* 3.6  CL 111 114* 110 112* 111  CO2 10* 17* 14* 20* 16*  GLUCOSE 312* 131* 206* 110* 193*  BUN 18 13 12 11 11   CREATININE 1.37* 0.89 0.88 0.83 0.74  CALCIUM 9.0 8.7* 8.3* 8.6* 8.0*  MG  --   --   --   --  1.6*  PHOS  --   --   --   --  1.9*   GFR: Estimated Creatinine Clearance: 89.7 mL/min (by C-G formula based on SCr of 0.74 mg/dL). Liver Function Tests:  Recent Labs Lab 10/14/16 0723  AST 28  ALT 28  ALKPHOS 77  BILITOT 1.7*  PROT 8.1  ALBUMIN 4.2   No results for input(s): LIPASE, AMYLASE in the last 168 hours. No results for input(s): AMMONIA in the last 168 hours. Coagulation Profile: No results for input(s): INR, PROTIME in the last 168 hours. Cardiac Enzymes: No results for input(s): CKTOTAL, CKMB, CKMBINDEX, TROPONINI in  the last 168 hours. BNP (last 3 results) No results for input(s): PROBNP in the last 8760 hours. HbA1C:  Recent Labs  10/14/16 1259  HGBA1C >15.5*   CBG:  Recent Labs Lab 10/15/16 0512 10/15/16 0617 10/15/16 0724 10/15/16 0836 10/15/16 0914  GLUCAP 123* 191* 180* 187* 197*   Lipid Profile: No results for input(s): CHOL, HDL, LDLCALC, TRIG, CHOLHDL, LDLDIRECT in the last 72 hours. Thyroid Function Tests: No results for input(s): TSH, T4TOTAL, FREET4, T3FREE, THYROIDAB in the last 72 hours. Anemia Panel: No results for input(s): VITAMINB12, FOLATE, FERRITIN, TIBC, IRON, RETICCTPCT in the last 72 hours. Urine analysis:    Component Value Date/Time   COLORURINE YELLOW 10/15/2016 0220   APPEARANCEUR HAZY (A) 10/15/2016 0220   APPEARANCEUR Hazy 11/10/2013 2043   LABSPEC 1.018 10/15/2016  0220   LABSPEC 1.031 11/10/2013 2043   PHURINE 5.0 10/15/2016 0220   GLUCOSEU >=500 (A) 10/15/2016 0220   GLUCOSEU >=500 11/10/2013 2043   HGBUR NEGATIVE 10/15/2016 0220   BILIRUBINUR NEGATIVE 10/15/2016 0220   BILIRUBINUR Negative 11/24/2015 1443   BILIRUBINUR Negative 11/10/2013 2043   KETONESUR 80 (A) 10/15/2016 0220   PROTEINUR NEGATIVE 10/15/2016 0220   UROBILINOGEN 0.2 11/24/2015 1443   UROBILINOGEN 1.0 03/12/2015 2039   NITRITE NEGATIVE 10/15/2016 0220   LEUKOCYTESUR TRACE (A) 10/15/2016 0220   LEUKOCYTESUR 1+ 11/10/2013 2043   Sepsis Labs: @LABRCNTIP (procalcitonin:4,lacticidven:4)  ) Recent Results (from the past 240 hour(s))  MRSA PCR Screening     Status: None   Collection Time: 10/14/16  7:45 PM  Result Value Ref Range Status   MRSA by PCR NEGATIVE NEGATIVE Final    Comment:        The GeneXpert MRSA Assay (FDA approved for NASAL specimens only), is one component of a comprehensive MRSA colonization surveillance program. It is not intended to diagnose MRSA infection nor to guide or monitor treatment for MRSA infections.          Radiology Studies: Dg Chest Port 1 View  Result Date: 10/14/2016 CLINICAL DATA:  Chest pain. EXAM: PORTABLE CHEST 1 VIEW COMPARISON:  Sep 05, 2016 FINDINGS: The heart size and mediastinal contours are within normal limits. Both lungs are clear. The visualized skeletal structures are unremarkable. IMPRESSION: No active disease. Electronically Signed   By: Dorise Bullion III M.D   On: 10/14/2016 11:50        Scheduled Meds: . enoxaparin (LOVENOX) injection  30 mg Subcutaneous Q24H  . insulin aspart  0-15 Units Subcutaneous TID WC  . insulin glargine  25 Units Subcutaneous Daily  . insulin regular  0-10 Units Intravenous TID WC  . pneumococcal 23 valent vaccine  0.5 mL Intramuscular Tomorrow-1000  . sodium chloride flush  3 mL Intravenous Q12H   Continuous Infusions: . sodium chloride Stopped (10/14/16 1454)  . dextrose 5  % and 0.45% NaCl 125 mL/hr at 10/15/16 0700  . insulin (NOVOLIN-R) infusion 1.3 Units/hr (10/15/16 0631)     LOS: 0 days    Time spent: 67min    Domenic Polite, MD [facility] Pager [phone]  If 7PM-7AM, please contact night-coverage [email or web address] Password [facility] 10/15/2016, 11:31 AM

## 2016-10-16 LAB — BASIC METABOLIC PANEL
Anion gap: 8 (ref 5–15)
BUN: 5 mg/dL — AB (ref 6–20)
CHLORIDE: 109 mmol/L (ref 101–111)
CO2: 19 mmol/L — ABNORMAL LOW (ref 22–32)
CREATININE: 0.67 mg/dL (ref 0.44–1.00)
Calcium: 8.4 mg/dL — ABNORMAL LOW (ref 8.9–10.3)
GFR calc Af Amer: >60 mL/min (ref >60)
GFR calc non Af Amer: >60 mL/min (ref >60)
GLUCOSE: 272 mg/dL — AB (ref 65–99)
Potassium: 3.5 mmol/L (ref 3.5–5.1)
Sodium: 136 mmol/L (ref 135–145)

## 2016-10-16 LAB — CBC
HEMATOCRIT: 33.4 % — AB (ref 36.0–46.0)
HEMOGLOBIN: 10.9 g/dL — AB (ref 12.0–15.0)
MCH: 26.6 pg (ref 26.0–34.0)
MCHC: 32.6 g/dL (ref 30.0–36.0)
MCV: 81.5 fL (ref 78.0–100.0)
Platelets: 288 10*3/uL (ref 150–400)
RBC: 4.1 MIL/uL (ref 3.87–5.11)
RDW: 13.5 % (ref 11.5–15.5)
WBC: 6.5 10*3/uL (ref 4.0–10.5)

## 2016-10-16 LAB — GLUCOSE, CAPILLARY
Glucose-Capillary: 171 mg/dL — ABNORMAL HIGH (ref 65–99)
Glucose-Capillary: 264 mg/dL — ABNORMAL HIGH (ref 65–99)
Glucose-Capillary: 181 mg/dL — ABNORMAL HIGH (ref 65–99)

## 2016-10-16 MED ORDER — INSULIN GLARGINE 100 UNIT/ML ~~LOC~~ SOLN
30.0000 [IU] | Freq: Every day | SUBCUTANEOUS | Status: DC
  Administered 2016-10-16: 30 [IU] via SUBCUTANEOUS
  Filled 2016-10-16: qty 0.3

## 2016-10-16 MED ORDER — INSULIN GLARGINE 100 UNIT/ML SOLOSTAR PEN: 30.0000 [IU] | PEN_INJECTOR | Freq: Every morning | SUBCUTANEOUS | 0 refills | Status: DC

## 2016-10-16 NOTE — Care Management Note (Addendum)
Case Management Note  Patient Details  Name: Ashley Freeman MRN: [id number] Date of Birth: June 13, 1990  Subjective/Objective:   Pt admitted with DKA                  Action/Plan:   PTA from home - pt states she no longer stays with her parents however wouldn't elaborate on living arrangements - denied barriers to returning home.  Pt is already active with [facility] and obtains prescriptions from clinic pharmacy (pharmacy contacted and verified that all AVS prescriptions are available for pick up).  Pt has post discharge follow up appt with [facility] for 6/20 at 2pm.     Expected Discharge Date:  10/16/16               Expected Discharge Plan:  Home/Self Care  In-House Referral:     Discharge planning Services  CM Consult, [facility]  Post Acute Care Choice:    Choice offered to:     DME Arranged:    DME Agency:     HH Arranged:    HH Agency:     Status of Service:     If discussed at [employer], dates discussed:    Additional Comments: Pt alert and oriented during assessement.  Pt states she doesn't have barriers to getting medications at [facility] - when asked why she felt like she is missing her medications, pt simply said "I just get behind sometimes".  CM provided education to pt regarding importance of DM management Maryclare Labrador, RN 10/16/2016, 8:54 AM

## 2016-10-16 NOTE — Progress Notes (Addendum)
Inpatient Diabetes Program Recommendations  AACE/ADA: New Consensus Statement on Inpatient Glycemic Control (2015)  Target Ranges:  Prepandial:   less than 140 mg/dL      Peak postprandial:   less than 180 mg/dL (1-2 hours)      Critically ill patients:  140 - 180 mg/dL   Results for MIZANI, DILDAY (MRN [id number]) as of 10/16/2016 09:35  Ref. Range 10/15/2016 08:36 10/15/2016 09:14 10/15/2016 11:43 10/15/2016 16:49 10/15/2016 22:17 10/16/2016 02:24 10/16/2016 07:41  Glucose-Capillary Latest Ref Range: 65 - 99 mg/dL 187 (H) 197 (H) 102 (H) 164 (H) 426 (H) 171 (H) 264 (H)   Review of Glycemic Control  Diabetes history: DM1 Outpatient Diabetes medications: Lantus 20 units QHS, Novolog 0-12 units TID with meals Current orders for Inpatient glycemic control: Lantus 30 units daily, Novolog 0-15 units TID with meals  Inpatient Diabetes Program Recommendations: Correction (SSI): Please consider decreasing Novolog correction to sensitive scale (0-9 units) TID with meals and adding Novolog 0-5 units QHS for bedtime correction. Insulin - Meal Coverage: Patient has DM1 and makes no insulin at all. Patient will require meal coverage insulin. Please consider ordering Novolog 5 units TID with meals for meal coverage if patient eats at least 50% of meals.  A1C:  A1C > 15.5% on 10/14/16. Patient will need to follow up with MD at Clinic for DM control.  Thanks, Barnie Alderman, RN, MSN, CDE Diabetes Coordinator Inpatient Diabetes Program [phone] (Team Pager from 8am to 5pm)

## 2016-10-17 ENCOUNTER — Inpatient Hospital Stay: Payer: Self-pay

## 2016-10-23 ENCOUNTER — Ambulatory Visit: Payer: Self-pay | Attending: Family Medicine | Admitting: Physician Assistant

## 2016-10-23 ENCOUNTER — Encounter: Payer: Self-pay | Admitting: Physician Assistant

## 2016-10-23 VITALS — BP 104/77 | HR 117 | Temp 98.3°F | Resp 18 | Ht 63.0 in | Wt 119.0 lb

## 2016-10-23 DIAGNOSIS — E109 Type 1 diabetes mellitus without complications: Secondary | ICD-10-CM | POA: Insufficient documentation

## 2016-10-23 DIAGNOSIS — E1065 Type 1 diabetes mellitus with hyperglycemia: Secondary | ICD-10-CM

## 2016-10-23 DIAGNOSIS — Z888 Allergy status to other drugs, medicaments and biological substances status: Secondary | ICD-10-CM | POA: Insufficient documentation

## 2016-10-23 DIAGNOSIS — IMO0002 Reserved for concepts with insufficient information to code with codable children: Secondary | ICD-10-CM

## 2016-10-23 DIAGNOSIS — Z880 Allergy status to penicillin: Secondary | ICD-10-CM | POA: Insufficient documentation

## 2016-10-23 DIAGNOSIS — Z794 Long term (current) use of insulin: Secondary | ICD-10-CM | POA: Insufficient documentation

## 2016-10-23 DIAGNOSIS — E108 Type 1 diabetes mellitus with unspecified complications: Secondary | ICD-10-CM

## 2016-10-23 LAB — GLUCOSE, POCT (MANUAL RESULT ENTRY): POC Glucose: 398 mg/dl — AB (ref 70–99)

## 2016-10-23 MED ORDER — INSULIN ASPART 100 UNIT/ML ~~LOC~~ SOLN
20.0000 [IU] | Freq: Once | SUBCUTANEOUS | Status: AC
  Administered 2016-10-23: 20 [IU] via SUBCUTANEOUS

## 2016-10-23 MED ORDER — INSULIN GLARGINE 100 UNIT/ML SOLOSTAR PEN: 40.0000 [IU] | PEN_INJECTOR | Freq: Every morning | SUBCUTANEOUS | 0 refills | Status: DC

## 2016-10-23 NOTE — Patient Instructions (Signed)
Check blood sugars fasting and at meal times and record and bring to next visit.

## 2016-10-23 NOTE — Progress Notes (Signed)
Patient ID: Ashley Freeman, female   DOB: 1991/01/19, 26 y.o.   MRN: [id number]    Ashley Freeman, is a 26 y.o. female  GYF:[id number]  FFM:[id number]  DOB - 26-Jan-1991  Subjective:  Chief Complaint and HPI: Ashley Freeman is a 26 y.o. female here for a follow up visit  After recent hospital admission 10/14/2016-10/16/2016 for DKA. No discharge summary is available at the time I am seeing the patient.  She says she is compliant with her meds.  She does not have a blood sugar log and A1C.15.5.  Quant Hcg consistently going down and almost at zero(7.8) since spontaneous Ab.  Today she presents feeling tired but otherwise ok.  She took the last of her insulin last night and is picking up her prescriptions at our pharmacy today.  Patient denies being pregnant but admits recent miscarriage.  Denies abdominal pain  ED/Hospital notes reviewed.    ROS:   Constitutional:  No f/c, No night sweats, No unexplained weight loss. EENT:  No vision changes, No blurry vision, No hearing changes. No mouth, throat, or ear problems.  Respiratory: No cough, No SOB Cardiac: No CP, no palpitations GI:  No abd pain, No N/V/D. GU: No Urinary s/sx Musculoskeletal: No joint pain Neuro: No headache, no dizziness, no motor weakness.  Skin: No rash Endocrine:  No polydipsia. No polyuria.  Psych: Denies SI/HI  Problem  Pregnancy of Unknown Anatomic Location (Resolved)  Abdominal Pain in Pregnancy, Antepartum (Resolved)  Possible Pregnancy (Resolved)    ALLERGIES: Allergies  Allergen Reactions  . Penicillins Hives and Rash    Has patient had a PCN reaction causing immediate rash, facial/tongue/throat swelling, SOB or lightheadedness with hypotension: Yes Has patient had a PCN reaction causing severe rash involving mucus membranes or skin necrosis: No Has patient had a PCN reaction that required hospitalization: Yes Has patient had a PCN reaction occurring within the last 10 years: No If all of  the above answers are "NO", then may proceed with Cephalosporin use.  . Benadryl [Diphenhydramine] Itching    PAST MEDICAL HISTORY: Past Medical History:  Diagnosis Date  . Diabetes mellitus 2001   Diagnosed at age 75 ; Type I  . Diarrhea 05/30/2016  . DKA (diabetic ketoacidoses) ([place]) 08/19/2013  . DKA (diabetic ketoacidoses) ([place]) 05/30/2016  . Gonorrhea 08/2011   Treated in 09/2011    MEDICATIONS AT HOME: Prior to Admission medications   Medication Sig Start Date End Date Taking? Authorizing Provider  glucose blood (TRUE METRIX BLOOD GLUCOSE TEST) test strip Use 3 times daily before meals 01/16/16  Yes Amao, Enobong, MD  insulin aspart (NOVOLOG FLEXPEN) 100 UNIT/ML FlexPen Inject 0-12 Units into the skin 3 (three) times daily with meals. As per sliding scale 01/16/16  Yes Arnoldo Morale, MD  Insulin Glargine (LANTUS) 100 UNIT/ML Solostar Pen Inject 40 Units into the skin every morning. 10/23/16  Yes McClung, Dionne Bucy, PA-C     Objective:  EXAM:   Vitals:   10/23/16 1418  BP: 104/77  Pulse: (!) 117  Resp: 18  Temp: 98.3 F (36.8 C)  TempSrc: Oral  SpO2: 98%  Weight: 119 lb (54 kg)  Height: 5\' 3"  (1.6 m)    General appearance : A&OX3. NAD. Non-toxic-appearing HEENT: Atraumatic and Normocephalic.  PERRLA. EOM intact.   Neck: supple, no JVD. No cervical lymphadenopathy. No thyromegaly Chest/Lungs:  Breathing-non-labored, Good air entry bilaterally, breath sounds normal without rales, rhonchi, or wheezing  CVS: S1 S2 regular, no murmurs, gallops, rubs  Extremities:  Bilateral Lower Ext shows no edema, both legs are warm to touch with = pulse throughout Neurology:  CN II-XII grossly intact, Non focal.   Psych:  TP linear. J/I WNL. Normal speech. Appropriate eye contact and affect.  Skin:  No Rash  Data Review Lab Results  Component Value Date   HGBA1C >15.5 (H) 10/14/2016   HGBA1C >14.0 (H) 01/16/2016   HGBA1C #106 01/16/2016     Assessment & Plan   1. Diabetes  mellitus type 1, uncontrolled, with complications ([place]) Check blood sugars fasting and AC and record and bring to next visit.   - POCT glucose (manual entry) - Comprehensive metabolic panel - insulin aspart (novoLOG) injection 20 Units; Inject 0.2 mLs (20 Units total) into the skin once. - Insulin Glargine (LANTUS) 100 UNIT/ML Solostar Pen; Inject 40 Units into the skin every morning.  Dispense: 15 mL; Refill: 0  Patient have been counseled extensively about nutrition and exercise  Return in about 2 weeks (around 11/06/2016) for Dr Calvert Cantor f/up.To ED if any s/sx DKA recur or blood sugars >400.   The patient was given clear instructions to go to ER or return to medical center if symptoms don't improve, worsen or new problems develop. The patient verbalized understanding. The patient was told to call to get lab results if they haven't heard anything in the next week.     [name], PA-C [facility] and [place], [place]   10/23/2016, 4:18 PM

## 2016-10-29 ENCOUNTER — Other Ambulatory Visit: Payer: Self-pay | Admitting: *Deleted

## 2016-10-29 DIAGNOSIS — E1065 Type 1 diabetes mellitus with hyperglycemia: Secondary | ICD-10-CM

## 2016-10-29 DIAGNOSIS — IMO0002 Reserved for concepts with insufficient information to code with codable children: Secondary | ICD-10-CM

## 2016-10-29 DIAGNOSIS — E108 Type 1 diabetes mellitus with unspecified complications: Principal | ICD-10-CM

## 2016-10-29 MED ORDER — INSULIN LISPRO 100 UNIT/ML ~~LOC~~ SOLN: 0.0000 [IU] | Freq: Three times a day (TID) | SUBCUTANEOUS | 3 refills | Status: DC

## 2016-10-29 MED ORDER — INSULIN GLARGINE 100 UNIT/ML SOLOSTAR PEN: 32.0000 [IU] | PEN_INJECTOR | Freq: Every morning | SUBCUTANEOUS | 3 refills | Status: DC

## 2016-10-29 NOTE — Telephone Encounter (Signed)
PRINTED FOR PASS PROGRAM 

## 2016-11-09 NOTE — Discharge Summary (Signed)
Physician Discharge Summary  Ashley Freeman BZJ:[id number] DOB: Nov 10, 1990 DOA: 10/14/2016  PCP: Arnoldo Morale, MD  Admit date: 10/14/2016 Discharge date: 10/16/2016  Time spent: 35 minutes  Recommendations for Outpatient Follow-up:  1. PCP in 1 week 2. Endocrinology in 71month   Discharge Diagnoses:  Active Problems:   Diabetes mellitus type 1, uncontrolled, with complications ([place])   Hyperglycemia   DKA (diabetic ketoacidoses) ([place])   Diarrhea   Discharge Condition:stable  Diet recommendation: Diabetic  Filed Weights   10/14/16 1821  Weight: 54.2 kg (119 lb 7.8 oz)    History of present illness:  Ashley S [place] a 26 y.o.femalewith PMH of DM, h/o DKAs, ? compliance presented with generalized fatigue, weakness, poor appetites associated with mild nausea, vomiting, abdominal discomfort. Admitted with DKA and AKI  Hospital Course:  DKA -suspect poor compliance related -improved quickly with Insulin gtt, IVF -resumed lantus and novolog,  -Hba1c was 15.5 indicating very poor long term control -advised close FU with PCP and Endocrine Referral  AKI -due to dehydration and hypovolemia -resolved with hydration  Hypomagnesemia -replaced  Discharge Exam: Vitals:   10/16/16 0737 10/16/16 1153  BP: 119/84 (!) 128/92  Pulse: (!) 106 (!) 109  Resp: (!) 24 18  Temp: 98.6 F (37 C) 98.2 F (36.8 C)    General: AAOx3 Cardiovascular: S1S2/RRR Respiratory: CTAB  Discharge Instructions   Discharge Instructions    Diet Carb Modified    Complete by:  As directed    Increase activity slowly    Complete by:  As directed      Discharge Medication List as of 10/16/2016  1:48 PM    CONTINUE these medications which have CHANGED   Details  Insulin Glargine (LANTUS) 100 UNIT/ML Solostar Pen Inject 30 Units into the skin every morning., Starting Tue 10/16/2016, No Print      CONTINUE these medications which have NOT CHANGED   Details  insulin aspart  (NOVOLOG FLEXPEN) 100 UNIT/ML FlexPen Inject 0-12 Units into the skin 3 (three) times daily with meals. As per sliding scale, Starting Mon 01/16/2016, No Print    glucose blood (TRUE METRIX BLOOD GLUCOSE TEST) test strip Use 3 times daily before meals, Normal      STOP taking these medications     benzonatate (TESSALON) 100 MG capsule        Allergies  Allergen Reactions  . Penicillins Hives and Rash    Has patient had a PCN reaction causing immediate rash, facial/tongue/throat swelling, SOB or lightheadedness with hypotension: Yes Has patient had a PCN reaction causing severe rash involving mucus membranes or skin necrosis: No Has patient had a PCN reaction that required hospitalization: Yes Has patient had a PCN reaction occurring within the last 10 years: No If all of the above answers are "NO", then may proceed with Cephalosporin use.  . Benadryl [Diphenhydramine] Itching   Follow-up Information    Arnoldo Morale, MD. Schedule an appointment as soon as possible for a visit in 24 week(s).   Specialty:  Family Medicine Contact information: [address] [place] [address] [phone]            The results of significant diagnostics from this hospitalization (including imaging, microbiology, ancillary and laboratory) are listed below for reference.    Significant Diagnostic Studies: Dg Chest Port 1 View  Result Date: 10/14/2016 CLINICAL DATA:  Chest pain. EXAM: PORTABLE CHEST 1 VIEW COMPARISON:  Sep 05, 2016 FINDINGS: The heart size and mediastinal contours are within normal limits.  Both lungs are clear. The visualized skeletal structures are unremarkable. IMPRESSION: No active disease. Electronically Signed   By: Dorise Bullion III M.D   On: 10/14/2016 11:50    Microbiology: No results found for this or any previous visit (from the past 240 hour(s)).   Labs: Basic Metabolic Panel: No results for input(s): NA, K, CL, CO2, GLUCOSE, BUN, CREATININE, CALCIUM,  MG, PHOS in the last 168 hours. Liver Function Tests: No results for input(s): AST, ALT, ALKPHOS, BILITOT, PROT, ALBUMIN in the last 168 hours. No results for input(s): LIPASE, AMYLASE in the last 168 hours. No results for input(s): AMMONIA in the last 168 hours. CBC: No results for input(s): WBC, NEUTROABS, HGB, HCT, MCV, PLT in the last 168 hours. Cardiac Enzymes: No results for input(s): CKTOTAL, CKMB, CKMBINDEX, TROPONINI in the last 168 hours. BNP: BNP (last 3 results) No results for input(s): BNP in the last 8760 hours.  ProBNP (last 3 results) No results for input(s): PROBNP in the last 8760 hours.  CBG: No results for input(s): GLUCAP in the last 168 hours.     SignedDomenic Polite MD.  [facility] 11/09/2016, 2:28 PM

## 2016-11-12 ENCOUNTER — Ambulatory Visit: Payer: Self-pay | Admitting: Family Medicine

## 2016-12-08 ENCOUNTER — Emergency Department (HOSPITAL_COMMUNITY)
Admission: EM | Admit: 2016-12-08 | Discharge: 2016-12-08 | Disposition: A | Discharge status: Home / Self Care | Payer: 59 | Attending: Emergency Medicine | Admitting: Emergency Medicine

## 2016-12-08 ENCOUNTER — Encounter (HOSPITAL_COMMUNITY): Payer: Self-pay

## 2016-12-08 DIAGNOSIS — E1065 Type 1 diabetes mellitus with hyperglycemia: Secondary | ICD-10-CM | POA: Insufficient documentation

## 2016-12-08 DIAGNOSIS — M79605 Pain in left leg: Secondary | ICD-10-CM | POA: Insufficient documentation

## 2016-12-08 DIAGNOSIS — R739 Hyperglycemia, unspecified: Secondary | ICD-10-CM

## 2016-12-08 DIAGNOSIS — M79604 Pain in right leg: Secondary | ICD-10-CM | POA: Diagnosis not present

## 2016-12-08 DIAGNOSIS — Z794 Long term (current) use of insulin: Secondary | ICD-10-CM | POA: Diagnosis not present

## 2016-12-08 LAB — URINALYSIS, ROUTINE W REFLEX MICROSCOPIC
Bacteria, UA: NONE SEEN
Bilirubin Urine: NEGATIVE
Glucose, UA: >=500 mg/dL — AB
Hgb urine dipstick: NEGATIVE
KETONES UR: NEGATIVE mg/dL
Nitrite: NEGATIVE
PROTEIN: NEGATIVE mg/dL
Specific Gravity, Urine: 1.031 — ABNORMAL HIGH (ref 1.005–1.030)
pH: 6 (ref 5.0–8.0)

## 2016-12-08 LAB — COMPREHENSIVE METABOLIC PANEL
ALK PHOS: 67 U/L (ref 38–126)
ALT: 15 U/L (ref 14–54)
AST: 20 U/L (ref 15–41)
Albumin: 3.5 g/dL (ref 3.5–5.0)
Anion gap: 10 (ref 5–15)
BILIRUBIN TOTAL: 0.5 mg/dL (ref 0.3–1.2)
BUN: 14 mg/dL (ref 6–20)
CALCIUM: 9 mg/dL (ref 8.9–10.3)
CO2: 21 mmol/L — AB (ref 22–32)
Chloride: 98 mmol/L — ABNORMAL LOW (ref 101–111)
Creatinine, Ser: 0.72 mg/dL (ref 0.44–1.00)
GFR calc non Af Amer: >60 mL/min (ref >60)
Glucose, Bld: 683 mg/dL (ref 65–99)
Potassium: 4.1 mmol/L (ref 3.5–5.1)
SODIUM: 129 mmol/L — AB (ref 135–145)
Total Protein: 6.8 g/dL (ref 6.5–8.1)

## 2016-12-08 LAB — CBC
HCT: 39.4 % (ref 36.0–46.0)
Hemoglobin: 12.9 g/dL (ref 12.0–15.0)
MCH: 27.3 pg (ref 26.0–34.0)
MCHC: 32.7 g/dL (ref 30.0–36.0)
MCV: 83.5 fL (ref 78.0–100.0)
Platelets: 293 10*3/uL (ref 150–400)
RBC: 4.72 MIL/uL (ref 3.87–5.11)
RDW: 13.1 % (ref 11.5–15.5)
WBC: 9.2 10*3/uL (ref 4.0–10.5)

## 2016-12-08 LAB — I-STAT BETA HCG BLOOD, ED (MC, WL, AP ONLY): I-stat hCG, quantitative: <5 m[IU]/mL (ref <5)

## 2016-12-08 LAB — LIPASE, BLOOD: Lipase: 33 U/L (ref 11–51)

## 2016-12-08 LAB — CBG MONITORING, ED: Glucose-Capillary: 157 mg/dL — ABNORMAL HIGH (ref 65–99)

## 2016-12-08 MED ORDER — SODIUM CHLORIDE 0.9 % IV BOLUS (SEPSIS)
1000.0000 mL | Freq: Once | INTRAVENOUS | Status: AC
  Administered 2016-12-08: 1000 mL via INTRAVENOUS

## 2016-12-08 MED ORDER — CEPHALEXIN 250 MG PO CAPS
500.0000 mg | ORAL_CAPSULE | Freq: Once | ORAL | Status: AC
  Administered 2016-12-08: 500 mg via ORAL
  Filled 2016-12-08: qty 2

## 2016-12-08 MED ORDER — TRAMADOL HCL 50 MG PO TABS
50.0000 mg | ORAL_TABLET | Freq: Once | ORAL | Status: AC
  Administered 2016-12-08: 50 mg via ORAL
  Filled 2016-12-08: qty 1

## 2016-12-08 MED ORDER — CEPHALEXIN 500 MG PO CAPS: 500.0000 mg | ORAL_CAPSULE | Freq: Three times a day (TID) | ORAL | 0 refills | Status: DC

## 2016-12-08 MED ORDER — INSULIN ASPART 100 UNIT/ML ~~LOC~~ SOLN
16.0000 [IU] | Freq: Once | SUBCUTANEOUS | Status: AC
  Administered 2016-12-08: 16 [IU] via SUBCUTANEOUS
  Filled 2016-12-08: qty 1

## 2016-12-08 NOTE — ED Provider Notes (Signed)
[facility] DEPT Provider Note   CSN: [id number] Arrival date & time: 12/08/16  1440     History   Chief Complaint Chief Complaint  Patient presents with  . Abdominal Pain  . Leg Pain    HPI Ashley Freeman is a 26 y.o. female.  Patient w hx iddm, c/o bilateral leg/foot pain for past week. Denies injury or strain. No swelling. No hx ddd. Denies back pain or radicular pain. Pain constant, dull, moderate. States ibuprofen didn't help much. Also thinks sugars may be high. States is compliant w meds, and has adequate at home. No vomiting. +polyuria. Mild suprapubic pain. No back/flank pain. No fever or chills. Normal appetite. Denies cough, sob, sore throat or uri c/o. No chest pain.    The history is provided by the patient.  Abdominal Pain   Pertinent negatives include fever, dysuria and headaches.  Leg Pain      Past Medical History:  Diagnosis Date  . Diabetes mellitus 2001   Diagnosed at age 77 ; Type I  . Diarrhea 05/30/2016  . DKA (diabetic ketoacidoses) ([place]) 08/19/2013  . DKA (diabetic ketoacidoses) ([place]) 05/30/2016  . Gonorrhea 08/2011   Treated in 09/2011    Patient Active Problem List   Diagnosis Date Noted  . History of trichomoniasis 05/31/2016  . Diarrhea 05/31/2016  . DKA (diabetic ketoacidoses) ([place]) 05/30/2016  . Hyperlipidemia 03/28/2016  . Diabetic neuropathy ([place]) 01/16/2016  . DKA, type 1 ([place]) 11/24/2015  . BV (bacterial vaginosis) 11/24/2015  . Chest pain 11/24/2015  . AKI (acute kidney injury) ([place]) 07/26/2014  . Infection of urinary tract 04/08/2014  . PID (acute pelvic inflammatory disease) 04/06/2014  . Diabetic ketoacidosis without coma associated with type 1 diabetes mellitus ([place]) 09/19/2013  . Infected cyst of Bartholin's gland duct 09/19/2013  . Bartholin's gland abscess 09/19/2013  . Sepsis ([place]) 09/19/2013  . Abscess, gluteal, right 08/24/2013  . Health care maintenance 08/24/2013  . Hypophosphatemia 08/18/2013  .  Hypomagnesemia 08/18/2013  . Leukocytosis, unspecified 08/17/2013  . Fever, unspecified 08/17/2013  . H/O medication noncompliance 08/12/2013  . Hyponatremia 08/04/2012  . Anemia 02/19/2012  . Hyperglycemia 11/06/2011  . Sinus tachycardia ([place]) 10/17/2011  . Diabetes mellitus type 1, uncontrolled, with complications ([place]) 71/09/2692    Past Surgical History:  Procedure Laterality Date  . INCISION AND DRAINAGE PERIRECTAL ABSCESS Right 08/18/2013   Procedure: IRRIGATION AND DEBRIDEMENT GLUTEAL ABSCESS;  Surgeon: Ralene Ok, MD;  Location: [facility];  Service: General;  Laterality: Right;  . INCISION AND DRAINAGE PERIRECTAL ABSCESS Right 09/19/2013   Procedure: IRRIGATION AND DEBRIDEMENT RIGHT GLUTEAL AND LABIAL ABSCESSES;  Surgeon: Ralene Ok, MD;  Location: [facility];  Service: General;  Laterality: Right;  . INCISION AND DRAINAGE PERIRECTAL ABSCESS Right 09/24/2013   Procedure: IRRIGATION AND DEBRIDEMENT PERIRECTAL ABSCESS;  Surgeon: Gwenyth Ober, MD;  Location: [facility];  Service: General;  Laterality: Right;  . NO PAST SURGERIES      OB History    Gravida Para Term Preterm AB Living   2 0     1 0   SAB TAB Ectopic Multiple Live Births   1               Home Medications    Prior to Admission medications   Medication Sig Start Date End Date Taking? Authorizing Provider  glucose blood (TRUE METRIX BLOOD GLUCOSE TEST) test strip Use 3 times daily before meals 01/16/16   Arnoldo Morale, MD  insulin aspart (NOVOLOG FLEXPEN) 100 UNIT/ML  FlexPen Inject 0-12 Units into the skin 3 (three) times daily with meals. As per sliding scale 01/16/16   Arnoldo Morale, MD  Insulin Glargine (LANTUS) 100 UNIT/ML Solostar Pen Inject 32 Units into the skin every morning. 10/29/16   Arnoldo Morale, MD  insulin lispro (HUMALOG) 100 UNIT/ML injection Inject 0-0.12 mLs (0-12 Units total) into the skin 3 (three) times daily with meals. 10/29/16   Arnoldo Morale, MD    Family History Family History  Problem  Relation Age of Onset  . Asthma Mother   . Gout Father   . Diabetes Paternal Grandmother   . Anesthesia problems Neg Hx     Social History Social History  Substance Use Topics  . Smoking status: Never Smoker  . Smokeless tobacco: Never Used  . Alcohol use No     Comment: occ     Allergies   Penicillins and Benadryl [diphenhydramine]   Review of Systems Review of Systems  Constitutional: Negative for chills and fever.  HENT: Negative for sore throat.   Eyes: Negative for redness.  Respiratory: Negative for shortness of breath.   Cardiovascular: Negative for chest pain.  Gastrointestinal: Positive for abdominal pain.  Endocrine: Positive for polyuria.  Genitourinary: Negative for dysuria and flank pain.  Musculoskeletal: Negative for back pain.  Skin: Negative for rash.  Neurological: Negative for headaches.  Hematological: Does not bruise/bleed easily.  Psychiatric/Behavioral: Negative for confusion.     Physical Exam Updated Vital Signs BP 129/87 (BP Location: Right Arm)   Pulse (!) 112   Temp 97.7 F (36.5 C) (Oral)   Resp 18   Wt 57.7 kg (127 lb 4 oz)   LMP 11/16/2016   SpO2 100%   BMI 22.54 kg/m   Physical Exam  Constitutional: She appears well-developed and well-nourished. No distress.  HENT:  Mouth/Throat: Oropharynx is clear and moist.  Eyes: Conjunctivae are normal. No scleral icterus.  Neck: Neck supple. No tracheal deviation present.  Cardiovascular: Normal rate, regular rhythm, normal heart sounds and intact distal pulses.  Exam reveals no gallop and no friction rub.   No murmur heard. Pulmonary/Chest: Effort normal and breath sounds normal. No respiratory distress.  Abdominal: Soft. Normal appearance and bowel sounds are normal. She exhibits no distension and no mass. There is no tenderness. There is no rebound and no guarding. No hernia.  Genitourinary:  Genitourinary Comments: No cva tenderness  Musculoskeletal: She exhibits no edema.  TLS  spine non tender, aligned. No leg swelling or tenderness. Distal pulses palp bil.   Neurological: She is alert.  Motor intact bil legs, stre 5/5. sens grossly intact.   Skin: Skin is warm and dry. No rash noted. She is not diaphoretic.  Psychiatric: She has a normal mood and affect.  Nursing note and vitals reviewed.    ED Treatments / Results  Labs (all labs ordered are listed, but only abnormal results are displayed) Results for orders placed or performed during the hospital encounter of 12/08/16  Lipase, blood  Result Value Ref Range   Lipase 33 11 - 51 U/L  Comprehensive metabolic panel  Result Value Ref Range   Sodium 129 (L) 135 - 145 mmol/L   Potassium 4.1 3.5 - 5.1 mmol/L   Chloride 98 (L) 101 - 111 mmol/L   CO2 21 (L) 22 - 32 mmol/L   Glucose, Bld 683 (HH) 65 - 99 mg/dL   BUN 14 6 - 20 mg/dL   Creatinine, Ser 0.72 0.44 - 1.00 mg/dL   Calcium 9.0  8.9 - 10.3 mg/dL   Total Protein 6.8 6.5 - 8.1 g/dL   Albumin 3.5 3.5 - 5.0 g/dL   AST 20 15 - 41 U/L   ALT 15 14 - 54 U/L   Alkaline Phosphatase 67 38 - 126 U/L   Total Bilirubin 0.5 0.3 - 1.2 mg/dL   GFR calc non Af Amer >60 >60 mL/min   GFR calc Af Amer >60 >60 mL/min   Anion gap 10 5 - 15  CBC  Result Value Ref Range   WBC 9.2 4.0 - 10.5 K/uL   RBC 4.72 3.87 - 5.11 MIL/uL   Hemoglobin 12.9 12.0 - 15.0 g/dL   HCT 39.4 36.0 - 46.0 %   MCV 83.5 78.0 - 100.0 fL   MCH 27.3 26.0 - 34.0 pg   MCHC 32.7 30.0 - 36.0 g/dL   RDW 13.1 11.5 - 15.5 %   Platelets 293 150 - 400 K/uL  Urinalysis, Routine w reflex microscopic  Result Value Ref Range   Color, Urine STRAW (A) YELLOW   APPearance HAZY (A) CLEAR   Specific Gravity, Urine 1.031 (H) 1.005 - 1.030   pH 6.0 5.0 - 8.0   Glucose, UA >=500 (A) NEGATIVE mg/dL   Hgb urine dipstick NEGATIVE NEGATIVE   Bilirubin Urine NEGATIVE NEGATIVE   Ketones, ur NEGATIVE NEGATIVE mg/dL   Protein, ur NEGATIVE NEGATIVE mg/dL   Nitrite NEGATIVE NEGATIVE   Leukocytes, UA MODERATE (A)  NEGATIVE   RBC / HPF 0-5 0 - 5 RBC/hpf   WBC, UA TOO NUMEROUS TO COUNT 0 - 5 WBC/hpf   Bacteria, UA NONE SEEN NONE SEEN   Squamous Epithelial / LPF 6-30 (A) NONE SEEN  I-Stat beta hCG blood, ED  Result Value Ref Range   I-stat hCG, quantitative <5.0 <5 mIU/mL   Comment 3          CBG monitoring, ED  Result Value Ref Range   Glucose-Capillary 157 (H) 65 - 99 mg/dL   EKG  EKG Interpretation None       Radiology No results found.  Procedures Procedures (including critical care time)  Medications Ordered in ED Medications  insulin aspart (novoLOG) injection 16 Units (not administered)  sodium chloride 0.9 % bolus 1,000 mL (not administered)  traMADol (ULTRAM) tablet 50 mg (not administered)     Initial Impression / Assessment and Plan / ED Course  I have reviewed the triage vital signs and the nursing notes.  Pertinent labs & imaging results that were available during my care of the patient were reviewed by me and considered in my medical decision making (see chart for details).  Labs sent.  Reviewed nursing notes and prior charts for additional history.   Glucose very high. Iv ns bolus. novolog sq.  Additional ivf.   Possible uti on labs. Keflex po.   Glucose improved.  Pt given po fluids, snack.  Pt currently appears stable for d/c.     Final Clinical Impressions(s) / ED Diagnoses   Final diagnoses:  None    New Prescriptions New Prescriptions   No medications on file     Lajean Saver, MD 12/08/16 1901

## 2016-12-08 NOTE — ED Notes (Signed)
Pt c/o pain in her hand rt  It dies not look swollen  But she reports that it is painful  Wants it  Removed  Removed  Iv team called

## 2016-12-08 NOTE — ED Notes (Signed)
Patient left at this time with all belongings. 

## 2016-12-08 NOTE — ED Notes (Signed)
ED Provider at bedside. 

## 2016-12-08 NOTE — Discharge Instructions (Addendum)
It was our pleasure to provide your ER care today - we hope that you feel better.  Drink adequate fluids.  Eat meal tonight, and recheck your sugars every 2 hours tonight.   Check your blood sugars 4x/day and record values.  Take your medication as prescribed.    For pain, you may take acetaminophen and/or ibuprofen as need.  The lab tests show a possible urine infection - take antibiotic as prescribed.   Follow up with your doctor this Monday for recheck.  Return to ER if worse, new symptoms, fevers, persistent vomiting, leg numbness/weakness, other concern.

## 2016-12-08 NOTE — ED Notes (Signed)
Iv catheter is not completely threaded  Pt does not want me to stick her again  Iv nss running in  Slowly very

## 2016-12-08 NOTE — ED Triage Notes (Signed)
Onset 1 week bilateral thighs down to feet painful, and worsening.  Onset several days lower abd pain.  No N/V/D.

## 2016-12-08 NOTE — ED Notes (Signed)
The pt is c/o bi-lateral leg pain for 2 weeks  No known iinjury  She is a diabetic  And she reports that she did not take her insulin at all yesterday  She took her insulin at 1200n today last time    She does not want the iv ordered by dr Ashok Cordia  He is aware

## 2016-12-08 NOTE — ED Notes (Signed)
She walked to the br without difficulty

## 2016-12-25 ENCOUNTER — Ambulatory Visit: Payer: 59 | Admitting: Family Medicine

## 2016-12-29 ENCOUNTER — Encounter (HOSPITAL_COMMUNITY): Payer: Self-pay | Admitting: *Deleted

## 2016-12-29 ENCOUNTER — Inpatient Hospital Stay (HOSPITAL_COMMUNITY)
Admission: EM | Admit: 2016-12-29 | Discharge: 2016-12-31 | DRG: 757 | Disposition: A | Discharge status: Home / Self Care | Payer: 59 | Attending: Family Medicine | Admitting: Family Medicine

## 2016-12-29 DIAGNOSIS — R000 Tachycardia, unspecified: Secondary | ICD-10-CM | POA: Diagnosis not present

## 2016-12-29 DIAGNOSIS — N179 Acute kidney failure, unspecified: Secondary | ICD-10-CM | POA: Diagnosis present

## 2016-12-29 DIAGNOSIS — L02214 Cutaneous abscess of groin: Secondary | ICD-10-CM | POA: Diagnosis present

## 2016-12-29 DIAGNOSIS — E1010 Type 1 diabetes mellitus with ketoacidosis without coma: Secondary | ICD-10-CM | POA: Diagnosis present

## 2016-12-29 DIAGNOSIS — E1110 Type 2 diabetes mellitus with ketoacidosis without coma: Secondary | ICD-10-CM | POA: Diagnosis present

## 2016-12-29 DIAGNOSIS — E1040 Type 1 diabetes mellitus with diabetic neuropathy, unspecified: Secondary | ICD-10-CM | POA: Diagnosis present

## 2016-12-29 DIAGNOSIS — E108 Type 1 diabetes mellitus with unspecified complications: Secondary | ICD-10-CM

## 2016-12-29 DIAGNOSIS — N764 Abscess of vulva: Secondary | ICD-10-CM | POA: Diagnosis present

## 2016-12-29 DIAGNOSIS — IMO0002 Reserved for concepts with insufficient information to code with codable children: Secondary | ICD-10-CM

## 2016-12-29 DIAGNOSIS — E1065 Type 1 diabetes mellitus with hyperglycemia: Secondary | ICD-10-CM

## 2016-12-29 LAB — URINALYSIS, ROUTINE W REFLEX MICROSCOPIC
Bilirubin Urine: NEGATIVE
Glucose, UA: >=500 mg/dL — AB
Ketones, ur: 80 mg/dL — AB
Nitrite: NEGATIVE
PROTEIN: NEGATIVE mg/dL
Specific Gravity, Urine: 1.027 (ref 1.005–1.030)
pH: 5 (ref 5.0–8.0)

## 2016-12-29 LAB — I-STAT VENOUS BLOOD GAS, ED
Acid-base deficit: 13 mmol/L — ABNORMAL HIGH (ref 0.0–2.0)
BICARBONATE: 13.6 mmol/L — AB (ref 20.0–28.0)
O2 SAT: 34 %
TCO2: 15 mmol/L — ABNORMAL LOW (ref 22–32)
pCO2, Ven: 33.3 mmHg — ABNORMAL LOW (ref 44.0–60.0)
pH, Ven: 7.217 — ABNORMAL LOW (ref 7.250–7.430)
pO2, Ven: 25 mmHg — CL (ref 32.0–45.0)

## 2016-12-29 LAB — GLUCOSE, CAPILLARY
GLUCOSE-CAPILLARY: 342 mg/dL — AB (ref 65–99)
Glucose-Capillary: 300 mg/dL — ABNORMAL HIGH (ref 65–99)
Glucose-Capillary: 208 mg/dL — ABNORMAL HIGH (ref 65–99)

## 2016-12-29 LAB — BASIC METABOLIC PANEL
Anion gap: 17 — ABNORMAL HIGH (ref 5–15)
Anion gap: 11 (ref 5–15)
BUN: 14 mg/dL (ref 6–20)
BUN: 9 mg/dL (ref 6–20)
CO2: 12 mmol/L — AB (ref 22–32)
CO2: 18 mmol/L — ABNORMAL LOW (ref 22–32)
Calcium: 9.2 mg/dL (ref 8.9–10.3)
Calcium: 9 mg/dL (ref 8.9–10.3)
Chloride: 96 mmol/L — ABNORMAL LOW (ref 101–111)
Chloride: 101 mmol/L (ref 101–111)
Creatinine, Ser: 1.34 mg/dL — ABNORMAL HIGH (ref 0.44–1.00)
Creatinine, Ser: 0.88 mg/dL (ref 0.44–1.00)
GFR calc Af Amer: >60 mL/min (ref >60)
GFR calc Af Amer: >60 mL/min (ref >60)
GFR calc non Af Amer: 55 mL/min — ABNORMAL LOW (ref >60)
GLUCOSE: 748 mg/dL — AB (ref 65–99)
GLUCOSE: 322 mg/dL — AB (ref 65–99)
POTASSIUM: 3.6 mmol/L (ref 3.5–5.1)
Potassium: 4.4 mmol/L (ref 3.5–5.1)
Sodium: 125 mmol/L — ABNORMAL LOW (ref 135–145)
Sodium: 130 mmol/L — ABNORMAL LOW (ref 135–145)

## 2016-12-29 LAB — I-STAT CHEM 8, ED
BUN: 14 mg/dL (ref 6–20)
CALCIUM ION: 1.18 mmol/L (ref 1.15–1.40)
CHLORIDE: 96 mmol/L — AB (ref 101–111)
CREATININE: 0.7 mg/dL (ref 0.44–1.00)
HCT: 43 % (ref 36.0–46.0)
Hemoglobin: 14.6 g/dL (ref 12.0–15.0)
Potassium: 4.7 mmol/L (ref 3.5–5.1)
Sodium: 125 mmol/L — ABNORMAL LOW (ref 135–145)
TCO2: 13 mmol/L — AB (ref 22–32)

## 2016-12-29 LAB — I-STAT BETA HCG BLOOD, ED (MC, WL, AP ONLY)

## 2016-12-29 LAB — CBG MONITORING, ED
Glucose-Capillary: 480 mg/dL — ABNORMAL HIGH (ref 65–99)
Glucose-Capillary: 360 mg/dL — ABNORMAL HIGH (ref 65–99)

## 2016-12-29 LAB — CBC
HEMATOCRIT: 37.9 % (ref 36.0–46.0)
Hemoglobin: 12.9 g/dL (ref 12.0–15.0)
MCH: 27.7 pg (ref 26.0–34.0)
MCHC: 34 g/dL (ref 30.0–36.0)
MCV: 81.5 fL (ref 78.0–100.0)
Platelets: 344 10*3/uL (ref 150–400)
RBC: 4.65 MIL/uL (ref 3.87–5.11)
RDW: 12.8 % (ref 11.5–15.5)
WBC: 15.2 10*3/uL — ABNORMAL HIGH (ref 4.0–10.5)

## 2016-12-29 LAB — MRSA PCR SCREENING: MRSA by PCR: NEGATIVE

## 2016-12-29 MED ORDER — INSULIN REGULAR HUMAN 100 UNIT/ML IJ SOLN
INTRAMUSCULAR | Status: DC
  Administered 2016-12-29: 5.4 [IU]/h via INTRAVENOUS
  Filled 2016-12-29: qty 1

## 2016-12-29 MED ORDER — SODIUM CHLORIDE 0.9 % IV BOLUS (SEPSIS): 1000.0000 mL | Freq: Once | INTRAVENOUS | Status: DC

## 2016-12-29 MED ORDER — SODIUM CHLORIDE 0.9 % IV SOLN
INTRAVENOUS | Status: DC
  Administered 2016-12-29 – 2016-12-31 (×4): via INTRAVENOUS
  Administered 2016-12-31: 1000 mL via INTRAVENOUS

## 2016-12-29 MED ORDER — ENOXAPARIN SODIUM 40 MG/0.4ML ~~LOC~~ SOLN
40.0000 mg | SUBCUTANEOUS | Status: DC
Start: 2016-12-29 — End: 2016-12-31

## 2016-12-29 MED ORDER — DEXTROSE-NACL 5-0.45 % IV SOLN: INTRAVENOUS | Status: DC

## 2016-12-29 MED ORDER — ACETAMINOPHEN 325 MG PO TABS
650.0000 mg | ORAL_TABLET | Freq: Four times a day (QID) | ORAL | Status: DC | PRN
  Administered 2016-12-29: 650 mg via ORAL
  Filled 2016-12-29: qty 2

## 2016-12-29 MED ORDER — CLINDAMYCIN PHOSPHATE 900 MG/50ML IV SOLN
900.0000 mg | Freq: Three times a day (TID) | INTRAVENOUS | Status: DC
  Administered 2016-12-30 (×2): 900 mg via INTRAVENOUS
  Filled 2016-12-29 (×3): qty 50

## 2016-12-29 MED ORDER — LACTATED RINGERS IV BOLUS (SEPSIS)
2000.0000 mL | Freq: Once | INTRAVENOUS | Status: AC
  Administered 2016-12-29: 2000 mL via INTRAVENOUS

## 2016-12-29 MED ORDER — POTASSIUM CHLORIDE 10 MEQ/100ML IV SOLN
10.0000 meq | INTRAVENOUS | Status: AC
  Administered 2016-12-29 (×2): 10 meq via INTRAVENOUS
  Filled 2016-12-29 (×2): qty 100

## 2016-12-29 MED ORDER — CLINDAMYCIN PHOSPHATE 900 MG/50ML IV SOLN
900.0000 mg | Freq: Once | INTRAVENOUS | Status: AC
  Administered 2016-12-29: 900 mg via INTRAVENOUS
  Filled 2016-12-29: qty 50

## 2016-12-29 MED ORDER — DEXTROSE-NACL 5-0.45 % IV SOLN
INTRAVENOUS | Status: DC
  Administered 2016-12-30: 125 mL via INTRAVENOUS

## 2016-12-29 NOTE — ED Notes (Signed)
Pt ambulatory to restroom

## 2016-12-29 NOTE — ED Notes (Signed)
Attempted report x1. 

## 2016-12-29 NOTE — ED Notes (Signed)
CBG >600; RN aware

## 2016-12-29 NOTE — ED Provider Notes (Signed)
[facility] DEPT Provider Note   CSN: [id number] Arrival date & time: 12/29/16  1432     History   Chief Complaint Chief Complaint  Patient presents with  . Abscess  . Hyperglycemia    HPI Ashley Freeman is a 26 y.o. female.  26 yo F with a cc of abscess going on for past couple of days.  Getting mildly worsening.  Some subjective fevers and chills. Has been out of her novalog for past couple half week.  Denies n/v.  Feeling generally unwell.    The history is provided by the patient.  Abscess  Location:  Pelvis Pelvic abscess location:  Groin Size:  2 Abscess quality: draining, fluctuance and induration   Red streaking: no   Duration:  2 days Progression:  Worsening Chronicity:  New Context: diabetes   Relieved by:  Nothing Worsened by:  Nothing Ineffective treatments:  None tried Associated symptoms: fever (subjective)   Associated symptoms: no headaches, no nausea and no vomiting   Hyperglycemia  Associated symptoms: fever (subjective)   Associated symptoms: no chest pain, no dizziness, no dysuria, no nausea, no shortness of breath and no vomiting     Past Medical History:  Diagnosis Date  . Diabetes mellitus 2001   Diagnosed at age 89 ; Type I  . Diarrhea 05/30/2016  . DKA (diabetic ketoacidoses) ([place]) 08/19/2013  . DKA (diabetic ketoacidoses) ([place]) 05/30/2016  . Gonorrhea 08/2011   Treated in 09/2011    Patient Active Problem List   Diagnosis Date Noted  . History of trichomoniasis 05/31/2016  . Diarrhea 05/31/2016  . DKA (diabetic ketoacidoses) ([place]) 05/30/2016  . Hyperlipidemia 03/28/2016  . Diabetic neuropathy ([place]) 01/16/2016  . DKA, type 1 ([place]) 11/24/2015  . BV (bacterial vaginosis) 11/24/2015  . Chest pain 11/24/2015  . AKI (acute kidney injury) ([place]) 07/26/2014  . Infection of urinary tract 04/08/2014  . PID (acute pelvic inflammatory disease) 04/06/2014  . Diabetic ketoacidosis without coma associated with type 1 diabetes mellitus  ([place]) 09/19/2013  . Infected cyst of Bartholin's gland duct 09/19/2013  . Bartholin's gland abscess 09/19/2013  . Sepsis ([place]) 09/19/2013  . Abscess, gluteal, right 08/24/2013  . Health care maintenance 08/24/2013  . Hypophosphatemia 08/18/2013  . Hypomagnesemia 08/18/2013  . Leukocytosis, unspecified 08/17/2013  . Fever, unspecified 08/17/2013  . H/O medication noncompliance 08/12/2013  . Hyponatremia 08/04/2012  . Anemia 02/19/2012  . Hyperglycemia 11/06/2011  . Sinus tachycardia ([place]) 10/17/2011  . Diabetes mellitus type 1, uncontrolled, with complications ([place]) 29/93/7169    Past Surgical History:  Procedure Laterality Date  . INCISION AND DRAINAGE PERIRECTAL ABSCESS Right 08/18/2013   Procedure: IRRIGATION AND DEBRIDEMENT GLUTEAL ABSCESS;  Surgeon: Ralene Ok, MD;  Location: [facility];  Service: General;  Laterality: Right;  . INCISION AND DRAINAGE PERIRECTAL ABSCESS Right 09/19/2013   Procedure: IRRIGATION AND DEBRIDEMENT RIGHT GLUTEAL AND LABIAL ABSCESSES;  Surgeon: Ralene Ok, MD;  Location: [facility];  Service: General;  Laterality: Right;  . INCISION AND DRAINAGE PERIRECTAL ABSCESS Right 09/24/2013   Procedure: IRRIGATION AND DEBRIDEMENT PERIRECTAL ABSCESS;  Surgeon: Gwenyth Ober, MD;  Location: [facility];  Service: General;  Laterality: Right;  . NO PAST SURGERIES      OB History    Gravida Para Term Preterm AB Living   2 0     1 0   SAB TAB Ectopic Multiple Live Births   1               Home Medications  Prior to Admission medications   Medication Sig Start Date End Date Taking? Authorizing Provider  cephALEXin (KEFLEX) 500 MG capsule Take 1 capsule (500 mg total) by mouth 3 (three) times daily. 12/08/16   Lajean Saver, MD  glucose blood (TRUE METRIX BLOOD GLUCOSE TEST) test strip Use 3 times daily before meals 01/16/16   Arnoldo Morale, MD  insulin aspart (NOVOLOG FLEXPEN) 100 UNIT/ML FlexPen Inject 0-12 Units into the skin 3 (three) times daily with meals. As per  sliding scale 01/16/16   Arnoldo Morale, MD  Insulin Glargine (LANTUS) 100 UNIT/ML Solostar Pen Inject 32 Units into the skin every morning. 10/29/16   Arnoldo Morale, MD  insulin lispro (HUMALOG) 100 UNIT/ML injection Inject 0-0.12 mLs (0-12 Units total) into the skin 3 (three) times daily with meals. 10/29/16   Arnoldo Morale, MD    Family History Family History  Problem Relation Age of Onset  . Asthma Mother   . Gout Father   . Diabetes Paternal Grandmother   . Anesthesia problems Neg Hx     Social History Social History  Substance Use Topics  . Smoking status: Never Smoker  . Smokeless tobacco: Never Used  . Alcohol use No     Comment: occ     Allergies   Penicillins and Benadryl [diphenhydramine]   Review of Systems Review of Systems  Constitutional: Positive for chills and fever (subjective).  HENT: Negative for congestion and rhinorrhea.   Eyes: Negative for redness and visual disturbance.  Respiratory: Negative for shortness of breath and wheezing.   Cardiovascular: Negative for chest pain and palpitations.  Gastrointestinal: Negative for nausea and vomiting.  Genitourinary: Negative for dysuria and urgency.  Musculoskeletal: Negative for arthralgias and myalgias.  Skin: Positive for color change and wound. Negative for pallor.  Neurological: Negative for dizziness and headaches.     Physical Exam Updated Vital Signs BP 130/85 (BP Location: Right Arm)   Pulse (!) 117   Temp 98.1 F (36.7 C) (Oral)   Resp 18   Ht 5\' 3"  (1.6 m)   Wt 57.6 kg (127 lb)   LMP 03/14/2016   SpO2 100%   BMI 22.50 kg/m   Physical Exam  Constitutional: She is oriented to person, place, and time. She appears well-developed and well-nourished. No distress.  HENT:  Head: Normocephalic and atraumatic.  Eyes: Pupils are equal, round, and reactive to light. EOM are normal.  Neck: Normal range of motion. Neck supple.  Cardiovascular: Regular rhythm.  Tachycardia present.  Exam reveals no  gallop and no friction rub.   No murmur heard. Pulmonary/Chest: Effort normal. She has no wheezes. She has no rales.  Abdominal: Soft. She exhibits no distension and no mass. There is no tenderness. There is no guarding.  Genitourinary:     Musculoskeletal: She exhibits no edema or tenderness.  Neurological: She is alert and oriented to person, place, and time.  Skin: Skin is warm and dry. She is not diaphoretic.  Psychiatric: She has a normal mood and affect. Her behavior is normal.  Nursing note and vitals reviewed.    ED Treatments / Results  Labs (all labs ordered are listed, but only abnormal results are displayed) Labs Reviewed  BASIC METABOLIC PANEL - Abnormal; Notable for the following:       Result Value   Sodium 125 (*)    Chloride 96 (*)    CO2 12 (*)    Glucose, Bld 748 (*)    Creatinine, Ser 1.34 (*)  GFR calc non Af Amer 55 (*)    Anion gap 17 (*)    All other components within normal limits  CBC - Abnormal; Notable for the following:    WBC 15.2 (*)    All other components within normal limits  CBG MONITORING, ED - Abnormal; Notable for the following:    Glucose-Capillary >600 (*)    All other components within normal limits  CBG MONITORING, ED - Abnormal; Notable for the following:    Glucose-Capillary >600 (*)    All other components within normal limits  I-STAT CHEM 8, ED - Abnormal; Notable for the following:    Sodium 125 (*)    Chloride 96 (*)    Glucose, Bld >700 (*)    TCO2 13 (*)    All other components within normal limits  I-STAT VENOUS BLOOD GAS, ED - Abnormal; Notable for the following:    pH, Ven 7.217 (*)    pCO2, Ven 33.3 (*)    pO2, Ven 25.0 (*)    Bicarbonate 13.6 (*)    TCO2 15 (*)    Acid-base deficit 13.0 (*)    All other components within normal limits  URINALYSIS, ROUTINE W REFLEX MICROSCOPIC  I-STAT BETA HCG BLOOD, ED ([facility], [facility], [facility] ONLY)    EKG  EKG Interpretation None       Radiology No results  found.  Procedures Procedures (including critical care time)  Medications Ordered in ED Medications  lactated ringers bolus 2,000 mL (2,000 mLs Intravenous New Bag/Given 12/29/16 1617)  dextrose 5 %-0.45 % sodium chloride infusion (not administered)  insulin regular (NOVOLIN R,HUMULIN R) 100 Units in sodium chloride 0.9 % 100 mL (1 Units/mL) infusion (not administered)  potassium chloride 10 mEq in 100 mL IVPB (not administered)  clindamycin (CLEOCIN) IVPB 900 mg (900 mg Intravenous New Bag/Given 12/29/16 1634)     Initial Impression / Assessment and Plan / ED Course  I have reviewed the triage vital signs and the nursing notes.  Pertinent labs & imaging results that were available during my care of the patient were reviewed by me and considered in my medical decision making (see chart for details).     26 yo F with a cc of an abscess.  Also has been off her home medications.  Found to be in DKA, started on insulin gtt.   CRITICAL CARE Performed by: Cecilio Asper   Total critical care time: 35 minutes  Critical care time was exclusive of separately billable procedures and treating other patients.  Critical care was necessary to treat or prevent imminent or life-threatening deterioration.  Critical care was time spent personally by me on the following activities: development of treatment plan with patient and/or surrogate as well as nursing, discussions with consultants, evaluation of patient's response to treatment, examination of patient, obtaining history from patient or surrogate, ordering and performing treatments and interventions, ordering and review of laboratory studies, ordering and review of radiographic studies, pulse oximetry and re-evaluation of patient's condition.  The patients results and plan were reviewed and discussed.   Any x-rays performed were independently reviewed by myself.   Differential diagnosis were considered with the presenting  HPI.  Medications  lactated ringers bolus 2,000 mL (2,000 mLs Intravenous New Bag/Given 12/29/16 1617)  dextrose 5 %-0.45 % sodium chloride infusion (not administered)  insulin regular (NOVOLIN R,HUMULIN R) 100 Units in sodium chloride 0.9 % 100 mL (1 Units/mL) infusion (not administered)  potassium chloride 10 mEq in 100 mL IVPB (  not administered)  clindamycin (CLEOCIN) IVPB 900 mg (900 mg Intravenous New Bag/Given 12/29/16 1634)    Vitals:   12/29/16 1436 12/29/16 1539  BP: 112/70 130/85  Pulse: (!) 124 (!) 117  Resp: 16 18  Temp: 98.1 F (36.7 C)   TempSrc: Oral   SpO2: 100% 100%  Weight: 57.6 kg (127 lb)   Height: 5\' 3"  (1.6 m)     Final diagnoses:  Diabetic ketoacidosis without coma associated with type 1 diabetes mellitus ([place])  Inguinal abscess    Admission/ observation were discussed with the admitting physician, patient and/or family and they are comfortable with the plan.   Final Clinical Impressions(s) / ED Diagnoses   Final diagnoses:  Diabetic ketoacidosis without coma associated with type 1 diabetes mellitus ([place])  Inguinal abscess    New Prescriptions New Prescriptions   No medications on file     Deno Etienne, DO 12/29/16 1640

## 2016-12-29 NOTE — ED Notes (Signed)
Pt states unable to obtain urine sample at triage.

## 2016-12-29 NOTE — ED Triage Notes (Signed)
Pt reports recent abscess to her groin area. Reports feeling like her sugar is high but has not checked it. cbg HIGH at triage. Airway intact, no resp distress is noted.

## 2016-12-29 NOTE — H&P (Signed)
[facility] Admission History and Physical Service Pager: [phone]  Patient name: Ashley Freeman Medical record number: [id number] Date of birth: 1990/11/06 Age: 26 y.o. Gender: female  Primary Care Provider: Arnoldo Morale, MD Consultants: none Code Status:  FULL  Chief Complaint: abscess, hyperglycemia  Assessment and Plan: Ashley Freeman is a 26 y.o. female presenting with L inguinal crease abscess, hyperglycemia, found to be in DKA. PMH is significant for T1DM, tachycardia, recurrent abscesses around genital region.   DKA: patient presents with CBG >600, states she has been out of Novolog for 2-3 days. She has been compliant with her Lantus 40U BID. She also developed a L inguinal crease abscess, which is spontaneously draining. Infection likely etiology for DKA. AG 17, VBG with pH 7.2, pCO2 33, pO2 25, bicarb 13. UA with ketones and large glucose. No respiratory or mental status compromise.  -admit to stepdown, Dr. Gwendlyn Deutscher attending -IV insulin until gap closed x 2 -s/p NS bolus, NS fluids until CBG <250, then start D51/2NS -65meQ K IV run every hour -BMP q4hrs  -CBGs q1hr  Left inguinal crease abscess: patient with recurrent genital abscesses, this appeared 2 days ago. Spontaneously draining on my exam purulent fluid.  -clindamycin 900mg  q8h for cellulitis with MRSA coverage -warm compresses -tylenol PRN pain  AKI: Cr 1.34, baseline 0.72. Likely due to osmotic stress with DKA.  -IVF as above with DKA Protocol -consider FeNa if not resolving  Need for birth control: patient with negative beta Hcg on admission. [id number], with most recent SAB 05/2016. Patient not on birth control, states she wants to be and has a follow up appointment in 1 week to discuss this.  -keep outpatient appt  FEN/GI: NPO on insulin drip Prophylaxis: lovenox  Disposition: admit to stepdown  History of Present Illness:  Ashley Freeman is a 26 y.o. female  presenting with hyperglycemia found to be in DKA and L inguinal crease abscess. Patient reports her home insulin is 40U lantus BID and a new sliding scale with Novolog that she can't recall. States she ran out of novolog 2 days ago. She also developed a new L inguinal crease abscess 2 days ago. She has an extensive history of similar boils per her report. She is unfamiliar with the term hydradenitis. Does report needing surgical treatment of a buttock abscess last year. No fevers at home, does endorse chills last night. States that her hyperglycemia was unintentional, does not want to hurt herself, understands the importance of her insulin. States that she normally gets sleep when in DKA, if severe she vomits. Denies N/V/D today.   In the ED, VBG as above, IV insulin and 2 bag method started. Clindamycin started.   Review Of Systems: Per HPI with the following additions: denies CP, SOB, abd pain, dysuria, vaginal discharge, changes in BMs, rash, weakness.   ROS  Patient Active Problem List   Diagnosis Date Noted  . History of trichomoniasis 05/31/2016  . Diarrhea 05/31/2016  . DKA (diabetic ketoacidoses) ([place]) 05/30/2016  . Hyperlipidemia 03/28/2016  . Diabetic neuropathy ([place]) 01/16/2016  . DKA, type 1 ([place]) 11/24/2015  . BV (bacterial vaginosis) 11/24/2015  . Chest pain 11/24/2015  . AKI (acute kidney injury) ([place]) 07/26/2014  . Infection of urinary tract 04/08/2014  . PID (acute pelvic inflammatory disease) 04/06/2014  . Diabetic ketoacidosis without coma associated with type 1 diabetes mellitus ([place]) 09/19/2013  . Infected cyst of Bartholin's gland duct 09/19/2013  . Bartholin's gland abscess 09/19/2013  .  Sepsis ([place]) 09/19/2013  . Abscess, gluteal, right 08/24/2013  . Health care maintenance 08/24/2013  . Hypophosphatemia 08/18/2013  . Hypomagnesemia 08/18/2013  . Leukocytosis, unspecified 08/17/2013  . Fever, unspecified 08/17/2013  . H/O medication noncompliance 08/12/2013  .  Hyponatremia 08/04/2012  . Anemia 02/19/2012  . Hyperglycemia 11/06/2011  . Sinus tachycardia ([place]) 10/17/2011  . Diabetes mellitus type 1, uncontrolled, with complications ([place]) 38/18/2993    Past Medical History: Past Medical History:  Diagnosis Date  . Diabetes mellitus 2001   Diagnosed at age 9 ; Type I  . Diarrhea 05/30/2016  . DKA (diabetic ketoacidoses) ([place]) 08/19/2013  . DKA (diabetic ketoacidoses) ([place]) 05/30/2016  . Gonorrhea 08/2011   Treated in 09/2011    Past Surgical History: Past Surgical History:  Procedure Laterality Date  . INCISION AND DRAINAGE PERIRECTAL ABSCESS Right 08/18/2013   Procedure: IRRIGATION AND DEBRIDEMENT GLUTEAL ABSCESS;  Surgeon: Ralene Ok, MD;  Location: [facility];  Service: General;  Laterality: Right;  . INCISION AND DRAINAGE PERIRECTAL ABSCESS Right 09/19/2013   Procedure: IRRIGATION AND DEBRIDEMENT RIGHT GLUTEAL AND LABIAL ABSCESSES;  Surgeon: Ralene Ok, MD;  Location: [facility];  Service: General;  Laterality: Right;  . INCISION AND DRAINAGE PERIRECTAL ABSCESS Right 09/24/2013   Procedure: IRRIGATION AND DEBRIDEMENT PERIRECTAL ABSCESS;  Surgeon: Gwenyth Ober, MD;  Location: [facility];  Service: General;  Laterality: Right;  . NO PAST SURGERIES      Social History: Social History  Substance Use Topics  . Smoking status: Never Smoker  . Smokeless tobacco: Never Used  . Alcohol use No     Comment: occ   Additional social history: none Please also refer to relevant sections of EMR.  Family History: Family History  Problem Relation Age of Onset  . Asthma Mother   . Gout Father   . Diabetes Paternal Grandmother   . Anesthesia problems Neg Hx     Allergies and Medications: Allergies  Allergen Reactions  . Penicillins Hives and Rash    Has patient had a PCN reaction causing immediate rash, facial/tongue/throat swelling, SOB or lightheadedness with hypotension: Yes Has patient had a PCN reaction causing severe rash involving mucus  membranes or skin necrosis: No Has patient had a PCN reaction that required hospitalization: Yes Has patient had a PCN reaction occurring within the last 10 years: No If all of the above answers are "NO", then may proceed with Cephalosporin use.  . Benadryl [Diphenhydramine] Itching   Current Facility-Administered Medications on File Prior to Encounter  Medication Dose Route Frequency Provider Last Rate Last Dose  . Insulin Pen Needle (NOVOFINE) 200 each  20 packet Subcutaneous PRN Allie Bossier, MD       Current Outpatient Prescriptions on File Prior to Encounter  Medication Sig Dispense Refill  . cephALEXin (KEFLEX) 500 MG capsule Take 1 capsule (500 mg total) by mouth 3 (three) times daily. 15 capsule 0  . glucose blood (TRUE METRIX BLOOD GLUCOSE TEST) test strip Use 3 times daily before meals 100 each 12  . insulin aspart (NOVOLOG FLEXPEN) 100 UNIT/ML FlexPen Inject 0-12 Units into the skin 3 (three) times daily with meals. As per sliding scale 15 mL 11  . Insulin Glargine (LANTUS) 100 UNIT/ML Solostar Pen Inject 32 Units into the skin every morning. 30 mL 3  . insulin lispro (HUMALOG) 100 UNIT/ML injection Inject 0-0.12 mLs (0-12 Units total) into the skin 3 (three) times daily with meals. 30 mL 3    Objective: BP 130/85 (BP Location: Right  Arm)   Pulse (!) 117   Temp 98.1 F (36.7 C) (Oral)   Resp 18   Ht 5\' 3"  (1.6 m)   Wt 127 lb (57.6 kg)   LMP 03/14/2016   SpO2 100%   BMI 22.50 kg/m  Exam: General: thin female resting in bed in NAD Eyes: EOMI, PERRLA ENTM: MMM, normal dentition Neck: supple, no JVD Cardiovascular: tachycardic, regular rhythm Respiratory: CTAB, easy WOB, no wheezing or crackles Gastrointestinal: SNTND, +BS MSK: normal muscle tone and bulk Derm: 3 cm indurated and erythematous linear abscess around L inguinal crease, spontaneously draining purulent fluid Neuro: CN II-XII intact, strength and sensation normal in all extremities Psych: mood and affect  appropriate  Labs and Imaging: CBC BMET   Recent Labs Lab 12/29/16 1457 12/29/16 1613  WBC 15.2*  --   HGB 12.9 14.6  HCT 37.9 43.0  PLT 344  --     Recent Labs Lab 12/29/16 1457 12/29/16 1613  NA 125* 125*  K 4.4 4.7  CL 96* 96*  CO2 12*  --   BUN 14 14  CREATININE 1.34* 0.70  GLUCOSE 748* >700*  CALCIUM 9.2  --        Sela Hilding, MD 12/29/2016, 4:37 PM PGY-2, [place] Intern pager: [phone], text pages welcome

## 2016-12-30 LAB — BASIC METABOLIC PANEL
Anion gap: 6 (ref 5–15)
Anion gap: 8 (ref 5–15)
Anion gap: 9 (ref 5–15)
BUN: 7 mg/dL (ref 6–20)
BUN: 7 mg/dL (ref 6–20)
BUN: 8 mg/dL (ref 6–20)
CALCIUM: 8.6 mg/dL — AB (ref 8.9–10.3)
CALCIUM: 8.3 mg/dL — AB (ref 8.9–10.3)
CHLORIDE: 105 mmol/L (ref 101–111)
CO2: 20 mmol/L — ABNORMAL LOW (ref 22–32)
CO2: 20 mmol/L — AB (ref 22–32)
CO2: 20 mmol/L — AB (ref 22–32)
CREATININE: 0.7 mg/dL (ref 0.44–1.00)
CREATININE: 0.59 mg/dL (ref 0.44–1.00)
Calcium: 8.8 mg/dL — ABNORMAL LOW (ref 8.9–10.3)
Chloride: 107 mmol/L (ref 101–111)
Chloride: 107 mmol/L (ref 101–111)
Creatinine, Ser: 0.7 mg/dL (ref 0.44–1.00)
GFR calc Af Amer: >60 mL/min (ref >60)
GFR calc Af Amer: >60 mL/min (ref >60)
GFR calc Af Amer: >60 mL/min (ref >60)
GLUCOSE: 155 mg/dL — AB (ref 65–99)
GLUCOSE: 190 mg/dL — AB (ref 65–99)
GLUCOSE: 195 mg/dL — AB (ref 65–99)
POTASSIUM: 3.6 mmol/L (ref 3.5–5.1)
Potassium: 3.5 mmol/L (ref 3.5–5.1)
Potassium: 3.3 mmol/L — ABNORMAL LOW (ref 3.5–5.1)
Sodium: 133 mmol/L — ABNORMAL LOW (ref 135–145)
Sodium: 133 mmol/L — ABNORMAL LOW (ref 135–145)
Sodium: 136 mmol/L (ref 135–145)

## 2016-12-30 LAB — GLUCOSE, CAPILLARY
GLUCOSE-CAPILLARY: 134 mg/dL — AB (ref 65–99)
GLUCOSE-CAPILLARY: 136 mg/dL — AB (ref 65–99)
GLUCOSE-CAPILLARY: 167 mg/dL — AB (ref 65–99)
GLUCOSE-CAPILLARY: 253 mg/dL — AB (ref 65–99)
Glucose-Capillary: 137 mg/dL — ABNORMAL HIGH (ref 65–99)
Glucose-Capillary: 145 mg/dL — ABNORMAL HIGH (ref 65–99)
Glucose-Capillary: 154 mg/dL — ABNORMAL HIGH (ref 65–99)
Glucose-Capillary: 159 mg/dL — ABNORMAL HIGH (ref 65–99)
Glucose-Capillary: 176 mg/dL — ABNORMAL HIGH (ref 65–99)
Glucose-Capillary: 186 mg/dL — ABNORMAL HIGH (ref 65–99)
Glucose-Capillary: 193 mg/dL — ABNORMAL HIGH (ref 65–99)
Glucose-Capillary: 288 mg/dL — ABNORMAL HIGH (ref 65–99)
Glucose-Capillary: 449 mg/dL — ABNORMAL HIGH (ref 65–99)

## 2016-12-30 MED ORDER — INSULIN GLARGINE 100 UNIT/ML ~~LOC~~ SOLN
20.0000 [IU] | Freq: Every day | SUBCUTANEOUS | Status: DC
  Administered 2016-12-30: 20 [IU] via SUBCUTANEOUS
  Filled 2016-12-30 (×2): qty 0.2

## 2016-12-30 MED ORDER — INSULIN ASPART 100 UNIT/ML ~~LOC~~ SOLN
15.0000 [IU] | Freq: Once | SUBCUTANEOUS | Status: AC
  Administered 2016-12-30: 15 [IU] via SUBCUTANEOUS

## 2016-12-30 MED ORDER — INSULIN GLARGINE 100 UNIT/ML ~~LOC~~ SOLN
40.0000 [IU] | Freq: Every day | SUBCUTANEOUS | Status: DC
  Administered 2016-12-30 – 2016-12-31 (×2): 40 [IU] via SUBCUTANEOUS
  Filled 2016-12-30 (×2): qty 0.4

## 2016-12-30 MED ORDER — INSULIN ASPART 100 UNIT/ML ~~LOC~~ SOLN
0.0000 [IU] | Freq: Three times a day (TID) | SUBCUTANEOUS | Status: DC
  Administered 2016-12-30: 5 [IU] via SUBCUTANEOUS
  Administered 2016-12-30 (×2): 2 [IU] via SUBCUTANEOUS

## 2016-12-30 MED ORDER — INSULIN ASPART 100 UNIT/ML ~~LOC~~ SOLN: 0.0000 [IU] | Freq: Three times a day (TID) | SUBCUTANEOUS | Status: DC

## 2016-12-30 MED ORDER — CLINDAMYCIN HCL 300 MG PO CAPS
300.0000 mg | ORAL_CAPSULE | Freq: Four times a day (QID) | ORAL | Status: DC
  Administered 2016-12-30 – 2016-12-31 (×5): 300 mg via ORAL
  Filled 2016-12-30 (×6): qty 1

## 2016-12-30 NOTE — Progress Notes (Signed)
Family Medicine Teaching Service Daily Progress Note Intern Pager: [phone]  Patient name: Ashley Freeman [facility] Medical record number: [id number] Date of birth: 05/13/1990 Age: 26 y.o. Gender: female  Primary Care Provider: Arnoldo Morale, MD Consultants: none Code Status: FULL  Pt Overview and Major Events to Date:  9/1 admitted with DKA and L inguinal crease abscess 9/2 gap closed, transitioned to subq insulin  Assessment and Plan: MYCALA WARSHAWSKY is a 26 y.o. female presenting with L inguinal crease abscess, hyperglycemia, found to be in DKA. PMH is significant for T1DM, tachycardia, recurrent abscesses around genital region.   DKA, resolved: Infection likely etiology for DKA. AG 17, VBG with pH 7.2, pCO2 33, pO2 25, bicarb 13. BMP with anion gap closed x 2, will transition to subq insulin this morning.  -Lantus 40units qAM, 20units nightly -stop insulin drip at 0900 -carb modified diet -sensitive sliding scale -BMP daily -transfer to floor  Left inguinal crease abscess: improving. patient with recurrent genital abscesses, this appeared 2 days prior to admission. Spontaneously draining on my exam purulent fluid.  -clindamycin 300mg  PO q6h for cellulitis with MRSA coverage -warm compresses -tylenol PRN pain  AKI, resolved: Cr 1.34, baseline 0.72. Likely due to osmotic stress with DKA. CR normalized with aggressive IV hydration overnight. -BMP daily  Need for birth control: patient with negative beta Hcg on admission. [id number], with most recent SAB 05/2016. Patient not on birth control, states she wants to be and has a follow up appointment in 1 week to discuss this.  -keep outpatient appt  FEN/GI: NPO on insulin drip Prophylaxis: lovenox  Disposition: continued inpatient management of DKA  Subjective:  Patient feels well, is hungry. Denies any recent sexual activity, feels she doesn't need STI testing. Reports again that her home lantus is 40u BID. Cannot recall her  sliding scale.   Objective: Temp:  [98.1 F (36.7 C)] 98.1 F (36.7 C) (09/02 0030) Pulse Rate:  [106-124] 119 (09/02 0030) Resp:  [11-27] 27 (09/02 0030) BP: (112-147)/(70-96) 117/75 (09/02 0030) SpO2:  [100 %] 100 % (09/02 0030) Weight:  [127 lb (57.6 kg)] 127 lb (57.6 kg) (09/01 1436) Physical Exam: General: well appearing female thin lying in bed in NAD Cardiovascular: tachycardic, regular rhythm, no murmur Respiratory: CTAB, easy WOB Abdomen: SNTND, +BS Extremities: no edema, warm  Laboratory:  Recent Labs Lab 12/29/16 1457 12/29/16 1613  WBC 15.2*  --   HGB 12.9 14.6  HCT 37.9 43.0  PLT 344  --     Recent Labs Lab 12/29/16 2109 12/30/16 0010 12/30/16 0353  NA 130* 133* 136  K 3.6 3.6 3.5  CL 101 105 107  CO2 18* 20* 20*  BUN 9 8 7   CREATININE 0.88 0.70 0.70  CALCIUM 9.0 8.8* 8.6*  GLUCOSE 322* 190* 195*    Imaging/Diagnostic Tests: No new imaging studies.  Sela Hilding, MD 12/30/2016, 6:48 AM PGY-2, [place] Intern pager: [phone], text pages welcome

## 2016-12-31 LAB — GLUCOSE, CAPILLARY
GLUCOSE-CAPILLARY: 108 mg/dL — AB (ref 65–99)
GLUCOSE-CAPILLARY: 95 mg/dL (ref 65–99)
GLUCOSE-CAPILLARY: 175 mg/dL — AB (ref 65–99)
GLUCOSE-CAPILLARY: 189 mg/dL — AB (ref 65–99)
GLUCOSE-CAPILLARY: 174 mg/dL — AB (ref 65–99)
Glucose-Capillary: 125 mg/dL — ABNORMAL HIGH (ref 65–99)

## 2016-12-31 LAB — CBC
HCT: 31.3 % — ABNORMAL LOW (ref 36.0–46.0)
HEMOGLOBIN: 10.5 g/dL — AB (ref 12.0–15.0)
MCH: 26.7 pg (ref 26.0–34.0)
MCHC: 33.5 g/dL (ref 30.0–36.0)
MCV: 79.6 fL (ref 78.0–100.0)
Platelets: 306 10*3/uL (ref 150–400)
RBC: 3.93 MIL/uL (ref 3.87–5.11)
RDW: 12.5 % (ref 11.5–15.5)
WBC: 6.9 10*3/uL (ref 4.0–10.5)

## 2016-12-31 LAB — BASIC METABOLIC PANEL
Anion gap: 6 (ref 5–15)
BUN: 6 mg/dL (ref 6–20)
CALCIUM: 8.4 mg/dL — AB (ref 8.9–10.3)
CO2: 23 mmol/L (ref 22–32)
Chloride: 110 mmol/L (ref 101–111)
Creatinine, Ser: 0.5 mg/dL (ref 0.44–1.00)
GFR calc Af Amer: >60 mL/min (ref >60)
Glucose, Bld: 119 mg/dL — ABNORMAL HIGH (ref 65–99)
Potassium: 3.3 mmol/L — ABNORMAL LOW (ref 3.5–5.1)
Sodium: 139 mmol/L (ref 135–145)

## 2016-12-31 MED ORDER — INSULIN ASPART 100 UNIT/ML FLEXPEN: PEN_INJECTOR | SUBCUTANEOUS | 11 refills | Status: DC

## 2016-12-31 MED ORDER — INSULIN GLARGINE 100 UNIT/ML ~~LOC~~ SOLN: 20.0000 [IU] | Freq: Every day | SUBCUTANEOUS | 11 refills | Status: DC

## 2016-12-31 MED ORDER — POTASSIUM CHLORIDE CRYS ER 20 MEQ PO TBCR
40.0000 meq | EXTENDED_RELEASE_TABLET | Freq: Once | ORAL | Status: AC
  Administered 2016-12-31: 40 meq via ORAL
  Filled 2016-12-31: qty 2

## 2016-12-31 MED ORDER — INSULIN GLARGINE 100 UNIT/ML ~~LOC~~ SOLN: 40.0000 [IU] | Freq: Every day | SUBCUTANEOUS | 11 refills | Status: DC

## 2016-12-31 MED ORDER — CLINDAMYCIN HCL 300 MG PO CAPS: 300.0000 mg | ORAL_CAPSULE | Freq: Four times a day (QID) | ORAL | 0 refills | Status: AC

## 2016-12-31 MED ORDER — INSULIN ASPART 100 UNIT/ML ~~LOC~~ SOLN
0.0000 [IU] | Freq: Three times a day (TID) | SUBCUTANEOUS | Status: DC
  Administered 2016-12-31 (×2): 2 [IU] via SUBCUTANEOUS

## 2016-12-31 MED ORDER — CLINDAMYCIN HCL 300 MG PO CAPS: 300.0000 mg | ORAL_CAPSULE | Freq: Four times a day (QID) | ORAL | 0 refills | Status: DC

## 2016-12-31 NOTE — Progress Notes (Signed)
Pt blood sugar 449, no orders for HS coverage. MD paged. MD ordered novolog 15 units once and recheck blood sugar in 1 hr. Will continue to monitor.

## 2016-12-31 NOTE — Discharge Summary (Signed)
[facility] Discharge Summary  Patient name: Ashley Freeman Medical record number: [id number] Date of birth: [birth year] Age: 26 y.o. Gender: female Date of Admission: 12/29/2016  Date of Discharge: 12/31/2016 Admitting Physician: Kinnie Feil, MD  Primary Care Provider: Arnoldo Morale, MD Consultants: None  Indication for Hospitalization: DKA, L inguinal abscess  Discharge Diagnoses/Problem List:  DKA, resolved L inguinal crease abscess, improving AKI, resolved Need for birth control  Disposition: Home  Discharge Condition: Improved  Discharge Exam:  General: well appearing female thin lying in bed in NAD Cardiovascular: regular rate and rhythm, no murmur Respiratory: CTAB, normal WOB Abdomen: SNTND, +BS Extremities: no edema, warm Genitalia: no purulence/drainage/bleeding when palpating L inguinal wound.  Tender to palpation.  Brief Hospital Course:  26yo F with PMH of DM1, recurrent genital abscesses presented to the hospital with high blood glucose unrecordable on home glucose meter, and L vulva swelling with spontaneously purulent drainage.  Per patient, had been compliant with insulin regimen but recently ran out of Novolog.  Infection coupled with need for medication refill likely precipitating event for DKA. VBG on admission notable for pH 7.2, AG 17, pCO2 33, pO2 25, bicarb 13. UA with ketones and large glucose.  She received NS bolus in the ED and remained on insulin drip until gap was closed x2. Once AG closed and ketones clear, she transitioned to subcutaneous insulin and is being discharged home with Lantus 40u in the morning, 20u at night, and Novolog with previous sliding scale regimen.  Patient also being discharged with Clindamycin for 7 day total course.  Issues for Follow Up:  1. Ensure toleration and compliance of antibiotic regimen. 2. Ensure compliance of insulin regimen.  Encourage carb counting and carb coverage with sliding  scale insulin as patient reports she has not being doing this at home. 3. Assess need for contraception. - Patient with negative Hcg on admission. [id number] with most recent SAB 05/2016. Patient expressed interest in contraceptive management while admitted and encouraged to follow up for this at her outpatient appointment.  Significant Procedures: None  Significant Labs and Imaging:   Recent Labs Lab 12/29/16 1457 12/29/16 1613 12/31/16 0429  WBC 15.2*  --  6.9  HGB 12.9 14.6 10.5*  HCT 37.9 43.0 31.3*  PLT 344  --  306    Recent Labs Lab 12/29/16 2109 12/30/16 0010 12/30/16 0353 12/30/16 0817 12/31/16 0429  NA 130* 133* 136 133* 139  K 3.6 3.6 3.5 3.3* 3.3*  CL 101 105 107 107 110  CO2 18* 20* 20* 20* 23  GLUCOSE 322* 190* 195* 155* 119*  BUN 9 8 7 7 6   CREATININE 0.88 0.70 0.70 0.59 0.50  CALCIUM 9.0 8.8* 8.6* 8.3* 8.4*    Results/Tests Pending at Time of Discharge: None  Discharge Medications:  Allergies as of 12/31/2016      Reactions   Penicillins Hives, Rash   Has patient had a PCN reaction causing immediate rash, facial/tongue/throat swelling, SOB or lightheadedness with hypotension: Yes Has patient had a PCN reaction causing severe rash involving mucus membranes or skin necrosis: No Has patient had a PCN reaction that required hospitalization: Yes Has patient had a PCN reaction occurring within the last 10 years: No If all of the above answers are "NO", then may proceed with Cephalosporin use.   Benadryl [diphenhydramine] Itching      Medication List    STOP taking these medications   cephALEXin 500 MG capsule Commonly known as:  KEFLEX  Insulin Glargine 100 UNIT/ML Solostar Pen Commonly known as:  LANTUS Replaced by:  insulin glargine 100 UNIT/ML injection   insulin lispro 100 UNIT/ML injection Commonly known as:  HUMALOG     TAKE these medications   clindamycin 300 MG capsule Commonly known as:  CLEOCIN Take 1 capsule (300 mg total) by mouth  every 6 (six) hours.   insulin aspart 100 UNIT/ML FlexPen Commonly known as:  NOVOLOG FLEXPEN Inject 0-12 Units into the skin 3 (three) times daily with meals. As per sliding scale What changed:  Another medication with the same name was added. Make sure you understand how and when to take each.   insulin aspart 100 UNIT/ML FlexPen Commonly known as:  NOVOLOG FLEXPEN Follow previous sliding scale at home. What changed:  You were already taking a medication with the same name, and this prescription was added. Make sure you understand how and when to take each.   insulin glargine 100 UNIT/ML injection Commonly known as:  LANTUS Inject 0.2 mLs (20 Units total) into the skin at bedtime.   insulin glargine 100 UNIT/ML injection Commonly known as:  LANTUS Inject 0.4 mLs (40 Units total) into the skin daily. Replaces:  Insulin Glargine 100 UNIT/ML Solostar Pen            Discharge Care Instructions        Start     Ordered   01/01/17 0000  insulin glargine (LANTUS) 100 UNIT/ML injection  Daily     12/31/16 1457   12/31/16 0000  insulin glargine (LANTUS) 100 UNIT/ML injection  Daily at bedtime     12/31/16 1457   12/31/16 0000  Increase activity slowly     12/31/16 1457   12/31/16 0000  Diet - low sodium heart healthy     12/31/16 1457   12/31/16 0000  clindamycin (CLEOCIN) 300 MG capsule  Every 6 hours     12/31/16 1520   12/31/16 0000  insulin aspart (NOVOLOG FLEXPEN) 100 UNIT/ML FlexPen     12/31/16 1520      Discharge Instructions: Please refer to Patient Instructions section of EMR for full details.  Patient was counseled important signs and symptoms that should prompt return to medical care, changes in medications, dietary instructions, activity restrictions, and follow up appointments.   Follow-Up Appointments: Follow up with PCP in 1-2 days.    01/09/2017 with PCP at 2:45pm.  Rory Percy, DO 12/31/2016, 3:44 PM PGY-1, [place]

## 2016-12-31 NOTE — Progress Notes (Signed)
Family Medicine Teaching Service Daily Progress Note Intern Pager: [phone]  Patient name: Ashley Freeman [facility] Medical record number: [id number] Date of birth: 08/14/1990 Age: 26 y.o. Gender: female  Primary Care Provider: Arnoldo Morale, MD Consultants: none Code Status: FULL  Pt Overview and Major Events to Date:  9/1 admitted with DKA and L inguinal crease abscess 9/2 gap closed, transitioned to subq insulin  Assessment and Plan: Ashley Freeman is a 26 y.o. female presenting with L inguinal crease abscess, hyperglycemia, found to be in DKA. PMH is significant for T1DM, tachycardia, recurrent abscesses around genital region.   DKA, resolved: Infection likely etiology for DKA. AG 17, VBG with pH 7.2, pCO2 33, pO2 25, bicarb 13. BMP with anion gap closed x 2. S/p transition to subq insulin yesterday. CBG 95-253 overnight. -Lantus 40units qAM, 20units nightly -insulin drip d/c'ed 12/30/16 -carb modified diet -sensitive sliding scale -BMP daily  Left inguinal crease abscess: improving. patient with recurrent genital abscesses, this appeared 2 days prior to admission.   -clindamycin 300mg  PO q6h for cellulitis with MRSA coverage -warm compresses -tylenol PRN pain  AKI, resolved: Cr 1.34, baseline 0.72. Likely due to osmotic stress with DKA. CR normalized with aggressive IV hydration. Most recent Cr 0.5. -BMP daily  Need for birth control: patient with negative beta Hcg on admission. [id number], with most recent SAB 05/2016. Patient not on birth control, states she wants to be and has a follow up appointment in 1 week to discuss this.  -keep outpatient appt  FEN/GI: Carb modified diet with SS insulin regimen and Lantus Prophylaxis: lovenox  Disposition: continued inpatient management of DKA  Subjective:  Patient feels well. Reports again that her home lantus is 40u BID. Cannot recall her sliding scale but states she doesn't take more than 10 units of Novolog at a time. She  does not count carbs nor provide any carb coverage.  She only gives herself Novolog based on her CBG which she checks 3 times daily at meals.  She states she only sometimes eats lunch. Her CBG usually runs high in the 200s.   She is not bothered by her wound this morning but states her leg is still sensitive.  Objective: Temp:  [97.8 F (36.6 C)-98.5 F (36.9 C)] 98.1 F (36.7 C) (09/03 0315) Pulse Rate:  [95-96] 95 (09/03 0315) Resp:  [18-20] 20 (09/02 1716) BP: (107-133)/(74-95) 112/80 (09/03 0315) SpO2:  [95 %-100 %] 100 % (09/03 0315) Physical Exam: General: well appearing female thin lying in bed in NAD Cardiovascular: regular rate and rhythm, no murmur Respiratory: CTAB, normal WOB Abdomen: SNTND, +BS Extremities: no edema, warm Genitalia: no purulence/drainage/bleeding when palpating L inguinal wound.  Tender to palpation.  Laboratory:  Recent Labs Lab 12/29/16 1457 12/29/16 1613 12/31/16 0429  WBC 15.2*  --  6.9  HGB 12.9 14.6 10.5*  HCT 37.9 43.0 31.3*  PLT 344  --  306    Recent Labs Lab 12/30/16 0353 12/30/16 0817 12/31/16 0429  NA 136 133* 139  K 3.5 3.3* 3.3*  CL 107 107 110  CO2 20* 20* 23  BUN 7 7 6   CREATININE 0.70 0.59 0.50  CALCIUM 8.6* 8.3* 8.4*  GLUCOSE 195* 155* 119*    Imaging/Diagnostic Tests: No new imaging studies.  Rory Percy, DO 12/31/2016, 6:52 AM PGY-1, [place]

## 2016-12-31 NOTE — Care Management Note (Signed)
Case Management Note  Patient Details  Name: Ashley Freeman MRN: [id number] Date of Birth: 17-Aug-1990  Subjective/Objective:                    Action/Plan: Pt discharging home with self care. Pt gets medications filled at [facility]. The pharmacy is closed today. Pt states she has some Lantus at home but does not have any Novolog or Clindamycin. MD asked to print prescriptions so she can get them filled today.  CM provided her with coupon and medication card to assist with the cost of the Novolog.  Pt states she has transportation home.   Expected Discharge Date:  12/31/16               Expected Discharge Plan:  Home/Self Care  In-House Referral:     Discharge planning Services  CM Consult, Medication Assistance  Post Acute Care Choice:    Choice offered to:     DME Arranged:    DME Agency:     HH Arranged:    HH Agency:     Status of Service:  Completed, signed off  If discussed at [employer] of Stay Meetings, dates discussed:    Additional Comments:  Pollie Friar, RN 12/31/2016, 3:31 PM

## 2016-12-31 NOTE — Progress Notes (Signed)
Explained and discussed discharge instructions RX and follow up visits given to patient.  Patient going home with friends via wheelchair with belongings.

## 2017-01-09 ENCOUNTER — Ambulatory Visit: Payer: 59 | Admitting: Family Medicine

## 2017-01-21 ENCOUNTER — Other Ambulatory Visit: Payer: Self-pay | Admitting: Family Medicine

## 2017-02-24 ENCOUNTER — Encounter (HOSPITAL_COMMUNITY): Payer: Self-pay | Admitting: Emergency Medicine

## 2017-02-24 ENCOUNTER — Emergency Department (HOSPITAL_COMMUNITY)
Admission: EM | Admit: 2017-02-24 | Discharge: 2017-02-24 | Disposition: A | Discharge status: Home / Self Care | Payer: 59 | Attending: Emergency Medicine | Admitting: Emergency Medicine

## 2017-02-24 DIAGNOSIS — E109 Type 1 diabetes mellitus without complications: Secondary | ICD-10-CM | POA: Insufficient documentation

## 2017-02-24 DIAGNOSIS — R591 Generalized enlarged lymph nodes: Secondary | ICD-10-CM | POA: Insufficient documentation

## 2017-02-24 DIAGNOSIS — Y929 Unspecified place or not applicable: Secondary | ICD-10-CM | POA: Insufficient documentation

## 2017-02-24 DIAGNOSIS — Y999 Unspecified external cause status: Secondary | ICD-10-CM | POA: Insufficient documentation

## 2017-02-24 DIAGNOSIS — X58XXXA Exposure to other specified factors, initial encounter: Secondary | ICD-10-CM | POA: Insufficient documentation

## 2017-02-24 DIAGNOSIS — Y939 Activity, unspecified: Secondary | ICD-10-CM | POA: Insufficient documentation

## 2017-02-24 DIAGNOSIS — Z794 Long term (current) use of insulin: Secondary | ICD-10-CM | POA: Insufficient documentation

## 2017-02-24 DIAGNOSIS — S0100XA Unspecified open wound of scalp, initial encounter: Secondary | ICD-10-CM | POA: Insufficient documentation

## 2017-02-24 MED ORDER — DOXYCYCLINE HYCLATE 100 MG PO CAPS: 100.0000 mg | ORAL_CAPSULE | Freq: Two times a day (BID) | ORAL | 0 refills | Status: DC

## 2017-02-24 NOTE — ED Provider Notes (Signed)
[facility] DEPT Provider Note   CSN: [id number] Arrival date & time: 02/24/17  1412     History   Chief Complaint Chief Complaint  Patient presents with  . knots on head and neck    HPI Ashley Freeman is a 26 y.o. female with a PMHx of DM1, HLD, tachycardia, recurrent abscesses, and other conditions listed below, who presents to the ED with complaints of 2 kn on her head that have been present for about 2 weeks. She states that she had braids put in several weeks ago, she took them out 2 weeks ago and noticed 2 separate knots in her scalp. One is painful, one is not. She scrubs the pain is 4/10 intermittent sore nonradiating scalp pain in the area of the knot on the crown of her head, worse with brushing her hair or putting her hair up in a ponytail, and mildly improved with ibuprofen. She reports that the areas have been decreasing in size, but are intermittently itchy as well. She has not changed any of her hair products except for using a new brand of braids, and thinks that this could have caused a problem, or it was just placed too tight. She denies any increasing swelling, warmth, drainage, red streaking, rashes elsewhere, fevers, chills, CP, SOB, abd pain, N/V/D/C, hematuria, dysuria, myalgias, arthralgias, numbness, tingling, focal weakness, or any other complaints at this time. She has not been having any difficulty controlling her blood sugars at home. Denies possibility of pregnancy, she is not sexually active with males.    The history is provided by the patient and medical records. No language interpreter was used.    Past Medical History:  Diagnosis Date  . Diabetes mellitus 2001   Diagnosed at age 76 ; Type I  . Diarrhea 05/30/2016  . DKA (diabetic ketoacidoses) ([place]) 08/19/2013  . DKA (diabetic ketoacidoses) ([place]) 05/30/2016  . Gonorrhea 08/2011   Treated in 09/2011    Patient Active Problem List   Diagnosis Date Noted  . DKA  (diabetic ketoacidosis) ([place]) 12/29/2016  . Inguinal abscess   . History of trichomoniasis 05/31/2016  . Diarrhea 05/31/2016  . DKA (diabetic ketoacidoses) ([place]) 05/30/2016  . Hyperlipidemia 03/28/2016  . Diabetic neuropathy ([place]) 01/16/2016  . DKA, type 1 ([place]) 11/24/2015  . BV (bacterial vaginosis) 11/24/2015  . Chest pain 11/24/2015  . AKI (acute kidney injury) ([place]) 07/26/2014  . Infection of urinary tract 04/08/2014  . PID (acute pelvic inflammatory disease) 04/06/2014  . Diabetic ketoacidosis without coma associated with type 1 diabetes mellitus ([place]) 09/19/2013  . Infected cyst of Bartholin's gland duct 09/19/2013  . Bartholin's gland abscess 09/19/2013  . Sepsis ([place]) 09/19/2013  . Abscess, gluteal, right 08/24/2013  . Health care maintenance 08/24/2013  . Hypophosphatemia 08/18/2013  . Hypomagnesemia 08/18/2013  . Leukocytosis, unspecified 08/17/2013  . Fever, unspecified 08/17/2013  . H/O medication noncompliance 08/12/2013  . Hyponatremia 08/04/2012  . Anemia 02/19/2012  . Hyperglycemia 11/06/2011  . Tachycardia 10/17/2011  . Diabetes mellitus type 1, uncontrolled, with complications ([place]) 51/88/4166    Past Surgical History:  Procedure Laterality Date  . INCISION AND DRAINAGE PERIRECTAL ABSCESS Right 08/18/2013   Procedure: IRRIGATION AND DEBRIDEMENT GLUTEAL ABSCESS;  Surgeon: Ralene Ok, MD;  Location: [facility];  Service: General;  Laterality: Right;  . INCISION AND DRAINAGE PERIRECTAL ABSCESS Right 09/19/2013   Procedure: IRRIGATION AND DEBRIDEMENT RIGHT GLUTEAL AND LABIAL ABSCESSES;  Surgeon: Ralene Ok, MD;  Location: [facility];  Service: General;  Laterality:  Right;  [name] INCISION AND DRAINAGE PERIRECTAL ABSCESS Right 09/24/2013   Procedure: IRRIGATION AND DEBRIDEMENT PERIRECTAL ABSCESS;  Surgeon: Gwenyth Ober, MD;  Location: [facility];  Service: General;  Laterality: Right;  . NO PAST SURGERIES      OB History    Gravida Para Term Preterm AB Living   2 0     1  0   SAB TAB Ectopic Multiple Live Births   1               Home Medications    Prior to Admission medications   Medication Sig Start Date End Date Taking? Authorizing Provider  insulin aspart (NOVOLOG FLEXPEN) 100 UNIT/ML FlexPen Inject 0-12 Units into the skin 3 (three) times daily with meals. As per sliding scale 01/16/16   Arnoldo Morale, MD  insulin aspart (NOVOLOG FLEXPEN) 100 UNIT/ML FlexPen Follow previous sliding scale at home. 12/31/16   Rory Percy, DO  insulin glargine (LANTUS) 100 UNIT/ML injection Inject 0.4 mLs (40 Units total) into the skin daily. 01/01/17   Rory Percy, DO  insulin glargine (LANTUS) 100 UNIT/ML injection Inject 0.2 mLs (20 Units total) into the skin at bedtime. 12/31/16   Rory Percy, DO    Family History Family History  Problem Relation Age of Onset  . Asthma Mother   . Gout Father   . Diabetes Paternal Grandmother   . Anesthesia problems Neg Hx     Social History Social History  Substance Use Topics  . Smoking status: Never Smoker  . Smokeless tobacco: Never Used  . Alcohol use No     Comment: occ     Allergies   Penicillins and Benadryl [diphenhydramine]   Review of Systems Review of Systems  Constitutional: Negative for chills and fever.  Respiratory: Negative for shortness of breath.   Cardiovascular: Negative for chest pain.  Gastrointestinal: Negative for abdominal pain, constipation, diarrhea, nausea and vomiting.  Genitourinary: Negative for dysuria and hematuria.  Musculoskeletal: Negative for arthralgias and myalgias.  Skin: Positive for wound. Negative for color change.  Allergic/Immunologic: Positive for immunocompromised state (DM1).  Neurological: Negative for weakness and numbness.  Psychiatric/Behavioral: Negative for confusion.   All other systems reviewed and are negative for acute change except as noted in the HPI.    Physical Exam Updated Vital Signs BP 108/84 (BP Location: Right Arm)   Pulse (!) 113    Temp 97.7 F (36.5 C) (Oral)   Resp 13   Ht 5\' 3"  (1.6 m)   Wt 56.4 kg (124 lb 6 oz)   LMP 01/18/2017   SpO2 100%   BMI 22.03 kg/m  REPEAT VS: BP (!) 128/94 (BP Location: Right Arm)   Pulse 98   Temp 97.7 F (36.5 C) (Oral)   Resp 16   Ht 5\' 3"  (1.6 m)   Wt 56.4 kg (124 lb 6 oz)   LMP 01/18/2017   SpO2 100%   BMI 22.03 kg/m    Physical Exam  Constitutional: She is oriented to person, place, and time. Vital signs are normal. She appears well-developed and well-nourished.  Non-toxic appearance. No distress.  Afebrile, nontoxic, NAD  HENT:  Head: Normocephalic. Head is with laceration (scab).    Mouth/Throat: Mucous membranes are normal.  Small scab to left crown area of the scalp, mildly tender, overlying an enlarged lymph node vs just skin induration, no fluctuance or drainage, no crusting around the scab, no macerated skin, no erythema or warmth, mildly TTP.  Separate enlarged lymph node  to the posterior right scalp, which has no overlying skin changes, and is nontender.   Eyes: Conjunctivae and EOM are normal. Right eye exhibits no discharge. Left eye exhibits no discharge.  Neck: Normal range of motion. Neck supple.  Cardiovascular: Intact distal pulses.  Tachycardia present.   Initially tachycardic in the 100-110s, similar to prior visits, which resolved on exam (HR 90s during exam)  Pulmonary/Chest: Effort normal. No respiratory distress.  Abdominal: Normal appearance. She exhibits no distension.  Musculoskeletal: Normal range of motion.  Neurological: She is alert and oriented to person, place, and time. She has normal strength. No sensory deficit.  Skin: Skin is warm, dry and intact. Lesion noted. No rash noted.  See HENT exam  Psychiatric: She has a normal mood and affect. Her behavior is normal.  Nursing note and vitals reviewed.    ED Treatments / Results  Labs (all labs ordered are listed, but only abnormal results are displayed) Labs Reviewed - No data to  display  EKG  EKG Interpretation None       Radiology No results found.  Procedures Procedures (including critical care time)  Medications Ordered in ED Medications - No data to display   Initial Impression / Assessment and Plan / ED Course  I have reviewed the triage vital signs and the nursing notes.  Pertinent labs & imaging results that were available during my care of the patient were reviewed by me and considered in my medical decision making (see chart for details).     26 y.o. female here with a knot on her scalp that is tender, and she noticed it after she took her braids out two weeks ago. Has another knot that's not painful, further down her scalp. On exam, the painful knot appears to be a small scab with underlying induration which could just be a lymph node, no erythema or warmth, no drainage or weeping, no fluctuance. The other knot on the right posterior scalp is just a swollen lymph node, no overlying skin changes. Likely just from scalp irritation from the braids being tight, however given pt has hx of DM1 and is predisposed to infection, will empirically cover for bacterial infection. Doesn't look like tinea, doubt need for antifungals. Advised letting her hair be free of braids and just leaving it loose, keep scalp clean, use tylenol/motrin for pain, and f/up with PCP in 5-7 days for recheck. Of note, HR elevated, however this seems to be her normal state; doubt need for further emergent work up of that at this time. I explained the diagnosis and have given explicit precautions to return to the ER including for any other new or worsening symptoms. The patient understands and accepts the medical plan as it's been dictated and I have answered their questions. Discharge instructions concerning home care and prescriptions have been given. The patient is STABLE and is discharged to home in good condition.    Final Clinical Impressions(s) / ED Diagnoses   Final diagnoses:   Open wound of scalp, unspecified open wound type, initial encounter  Lymphadenopathy of head and neck    New Prescriptions New Prescriptions   DOXYCYCLINE (VIBRAMYCIN) 100 MG CAPSULE    Take 1 capsule (100 mg total) by mouth 2 (two) times daily. One po bid x 7 days     [address] [place], [place] 02/24/17 1649    Julianne Rice, MD 02/24/17 [phone]

## 2017-02-24 NOTE — ED Triage Notes (Signed)
Patient reports knot on head and neck that been there about 2 weeks. dont know if from having braids so tight. Patient reports scabbing and painful. Patient reports that when she combs her hair it will bleed.

## 2017-02-24 NOTE — Discharge Instructions (Signed)
Keep hair clean and dry, do not use any new hair products, do not braid your hair until the area resolves. Apply warm compresses to affected area throughout the day. Take antibiotic until it is finished. Alternate between tylenol and motrin, as needed for pain. Follow-up with your Primary Care doctor in 5-7 days for wound recheck. Monitor area for signs of infection to include, but not limited to: increasing pain, spreading redness, drainage/pus, worsening swelling, or fevers. Return to emergency department for emergent changing or worsening symptoms.

## 2017-05-25 ENCOUNTER — Encounter (HOSPITAL_COMMUNITY): Payer: Self-pay | Admitting: Emergency Medicine

## 2017-05-25 ENCOUNTER — Emergency Department (HOSPITAL_COMMUNITY)
Admission: EM | Admit: 2017-05-25 | Discharge: 2017-05-25 | Disposition: A | Discharge status: Home / Self Care | Payer: 59 | Attending: Emergency Medicine | Admitting: Emergency Medicine

## 2017-05-25 DIAGNOSIS — E1065 Type 1 diabetes mellitus with hyperglycemia: Secondary | ICD-10-CM

## 2017-05-25 DIAGNOSIS — E109 Type 1 diabetes mellitus without complications: Secondary | ICD-10-CM | POA: Insufficient documentation

## 2017-05-25 DIAGNOSIS — N764 Abscess of vulva: Secondary | ICD-10-CM | POA: Insufficient documentation

## 2017-05-25 DIAGNOSIS — L0291 Cutaneous abscess, unspecified: Secondary | ICD-10-CM

## 2017-05-25 LAB — BASIC METABOLIC PANEL
ANION GAP: 13 (ref 5–15)
BUN: 17 mg/dL (ref 6–20)
CO2: 19 mmol/L — AB (ref 22–32)
Calcium: 9.3 mg/dL (ref 8.9–10.3)
Chloride: 91 mmol/L — ABNORMAL LOW (ref 101–111)
Creatinine, Ser: 0.92 mg/dL (ref 0.44–1.00)
GFR calc non Af Amer: >60 mL/min (ref >60)
Glucose, Bld: 806 mg/dL (ref 65–99)
Potassium: 4.3 mmol/L (ref 3.5–5.1)
Sodium: 123 mmol/L — ABNORMAL LOW (ref 135–145)

## 2017-05-25 LAB — CBG MONITORING, ED
Glucose-Capillary: >600 mg/dL (ref 65–99)
Glucose-Capillary: 542 mg/dL (ref 65–99)
Glucose-Capillary: 349 mg/dL — ABNORMAL HIGH (ref 65–99)
Glucose-Capillary: 381 mg/dL — ABNORMAL HIGH (ref 65–99)

## 2017-05-25 LAB — CBC
HCT: 36.8 % (ref 36.0–46.0)
HEMOGLOBIN: 12.6 g/dL (ref 12.0–15.0)
MCH: 27.7 pg (ref 26.0–34.0)
MCHC: 34.2 g/dL (ref 30.0–36.0)
MCV: 80.9 fL (ref 78.0–100.0)
Platelets: 280 10*3/uL (ref 150–400)
RBC: 4.55 MIL/uL (ref 3.87–5.11)
RDW: 13 % (ref 11.5–15.5)
WBC: 12.4 10*3/uL — ABNORMAL HIGH (ref 4.0–10.5)

## 2017-05-25 LAB — URINALYSIS, ROUTINE W REFLEX MICROSCOPIC
BILIRUBIN URINE: NEGATIVE
KETONES UR: NEGATIVE mg/dL
Nitrite: NEGATIVE
Protein, ur: NEGATIVE mg/dL
Specific Gravity, Urine: 1.027 (ref 1.005–1.030)
pH: 6 (ref 5.0–8.0)

## 2017-05-25 LAB — I-STAT BETA HCG BLOOD, ED (MC, WL, AP ONLY): I-stat hCG, quantitative: <5 m[IU]/mL (ref <5)

## 2017-05-25 MED ORDER — SODIUM CHLORIDE 0.9 % IV BOLUS (SEPSIS)
1000.0000 mL | Freq: Once | INTRAVENOUS | Status: AC
  Administered 2017-05-25: 1000 mL via INTRAVENOUS

## 2017-05-25 MED ORDER — SODIUM CHLORIDE 0.9 % IV SOLN
INTRAVENOUS | Status: DC
  Administered 2017-05-25: 4.8 [IU]/h via INTRAVENOUS
  Filled 2017-05-25: qty 1

## 2017-05-25 MED ORDER — BLOOD GLUCOSE MONITOR KIT: PACK | 0 refills | Status: DC

## 2017-05-25 MED ORDER — ONDANSETRON HCL 4 MG/2ML IJ SOLN: 4.0000 mg | Freq: Once | INTRAMUSCULAR | Status: DC

## 2017-05-25 MED ORDER — POTASSIUM CHLORIDE 10 MEQ/100ML IV SOLN
10.0000 meq | INTRAVENOUS | Status: AC
  Administered 2017-05-25 (×2): 10 meq via INTRAVENOUS
  Filled 2017-05-25: qty 100

## 2017-05-25 MED ORDER — SULFAMETHOXAZOLE-TRIMETHOPRIM 800-160 MG PO TABS
1.0000 | ORAL_TABLET | Freq: Once | ORAL | Status: AC
  Administered 2017-05-25: 1 via ORAL
  Filled 2017-05-25: qty 1

## 2017-05-25 MED ORDER — DEXTROSE-NACL 5-0.45 % IV SOLN: INTRAVENOUS | Status: DC

## 2017-05-25 MED ORDER — HYDROCODONE-ACETAMINOPHEN 5-325 MG PO TABS: 1.0000 | ORAL_TABLET | ORAL | 0 refills | Status: DC | PRN

## 2017-05-25 MED ORDER — MORPHINE SULFATE (PF) 4 MG/ML IV SOLN: 4.0000 mg | Freq: Once | INTRAVENOUS | Status: DC

## 2017-05-25 MED ORDER — LIDOCAINE-EPINEPHRINE (PF) 2 %-1:200000 IJ SOLN
10.0000 mL | Freq: Once | INTRAMUSCULAR | Status: AC
  Administered 2017-05-25: 10 mL
  Filled 2017-05-25: qty 20

## 2017-05-25 MED ORDER — HYDROCODONE-ACETAMINOPHEN 5-325 MG PO TABS
1.0000 | ORAL_TABLET | Freq: Once | ORAL | Status: AC
  Administered 2017-05-25: 1 via ORAL
  Filled 2017-05-25: qty 1

## 2017-05-25 MED ORDER — SULFAMETHOXAZOLE-TRIMETHOPRIM 800-160 MG PO TABS: 1.0000 | ORAL_TABLET | Freq: Two times a day (BID) | ORAL | 0 refills | Status: AC

## 2017-05-25 NOTE — ED Provider Notes (Signed)
[facility] DEPT Provider Note   CSN: [id number] Arrival date & time: 05/25/17  1418     History   Chief Complaint Chief Complaint  Patient presents with  . Hypergylcemia  . Abscess    HPI Ashley Freeman is a 27 y.o. female.  Pt presents to the ED today with an abscess to her left groin area.  She is a diabetic and has not checked her blood sugar in a few months b/c her glucometer is broken.  The pt did take her normal lantus today.  The pt said she's been urinating a lot.  Pt does admit to not taking her insulin as directed.      Past Medical History:  Diagnosis Date  . Diabetes mellitus 2001   Diagnosed at age 46 ; Type I  . Diarrhea 05/30/2016  . DKA (diabetic ketoacidoses) ([place]) 08/19/2013  . DKA (diabetic ketoacidoses) ([place]) 05/30/2016  . Gonorrhea 08/2011   Treated in 09/2011    Patient Active Problem List   Diagnosis Date Noted  . DKA (diabetic ketoacidosis) ([place]) 12/29/2016  . Inguinal abscess   . History of trichomoniasis 05/31/2016  . Diarrhea 05/31/2016  . DKA (diabetic ketoacidoses) ([place]) 05/30/2016  . Hyperlipidemia 03/28/2016  . Diabetic neuropathy ([place]) 01/16/2016  . DKA, type 1 ([place]) 11/24/2015  . BV (bacterial vaginosis) 11/24/2015  . Chest pain 11/24/2015  . AKI (acute kidney injury) ([place]) 07/26/2014  . Infection of urinary tract 04/08/2014  . PID (acute pelvic inflammatory disease) 04/06/2014  . Diabetic ketoacidosis without coma associated with type 1 diabetes mellitus ([place]) 09/19/2013  . Infected cyst of Bartholin's gland duct 09/19/2013  . Bartholin's gland abscess 09/19/2013  . Sepsis ([place]) 09/19/2013  . Abscess, gluteal, right 08/24/2013  . Health care maintenance 08/24/2013  . Hypophosphatemia 08/18/2013  . Hypomagnesemia 08/18/2013  . Leukocytosis, unspecified 08/17/2013  . Fever, unspecified 08/17/2013  . H/O medication noncompliance 08/12/2013  . Hyponatremia 08/04/2012  . Anemia 02/19/2012    . Hyperglycemia 11/06/2011  . Tachycardia 10/17/2011  . Diabetes mellitus type 1, uncontrolled, with complications ([place]) 31/43/8887    Past Surgical History:  Procedure Laterality Date  . INCISION AND DRAINAGE PERIRECTAL ABSCESS Right 08/18/2013   Procedure: IRRIGATION AND DEBRIDEMENT GLUTEAL ABSCESS;  Surgeon: Ralene Ok, MD;  Location: [facility];  Service: General;  Laterality: Right;  . INCISION AND DRAINAGE PERIRECTAL ABSCESS Right 09/19/2013   Procedure: IRRIGATION AND DEBRIDEMENT RIGHT GLUTEAL AND LABIAL ABSCESSES;  Surgeon: Ralene Ok, MD;  Location: [facility];  Service: General;  Laterality: Right;  . INCISION AND DRAINAGE PERIRECTAL ABSCESS Right 09/24/2013   Procedure: IRRIGATION AND DEBRIDEMENT PERIRECTAL ABSCESS;  Surgeon: Gwenyth Ober, MD;  Location: [facility];  Service: General;  Laterality: Right;  . NO PAST SURGERIES      OB History    Gravida Para Term Preterm AB Living   2 0     1 0   SAB TAB Ectopic Multiple Live Births   1               Home Medications    Prior to Admission medications   Medication Sig Start Date End Date Taking? Authorizing Provider  insulin aspart (NOVOLOG FLEXPEN) 100 UNIT/ML FlexPen Follow previous sliding scale at home. 12/31/16  Yes Rory Percy, DO  insulin glargine (LANTUS) 100 UNIT/ML injection Inject 0.4 mLs (40 Units total) into the skin daily. 01/01/17  Yes Rory Percy, DO  blood glucose meter kit and supplies KIT Dispense based on patient  and insurance preference. Use up to four times daily as directed. (FOR ICD-9 250.00, 250.01). 05/25/17   Isla Pence, MD  doxycycline (VIBRAMYCIN) 100 MG capsule Take 1 capsule (100 mg total) by mouth 2 (two) times daily. One po bid x 7 days Patient not taking: Reported on 05/25/2017 02/24/17   Street, [place], PA-C  HYDROcodone-acetaminophen (NORCO/VICODIN) 5-325 MG tablet Take 1 tablet by mouth every 4 (four) hours as needed. 05/25/17   Isla Pence, MD  insulin aspart (NOVOLOG FLEXPEN)  100 UNIT/ML FlexPen Inject 0-12 Units into the skin 3 (three) times daily with meals. As per sliding scale Patient not taking: Reported on 05/25/2017 01/16/16   Charlott Rakes, MD  sulfamethoxazole-trimethoprim (BACTRIM DS,SEPTRA DS) 800-160 MG tablet Take 1 tablet by mouth 2 (two) times daily for 7 days. 05/25/17 06/01/17  Isla Pence, MD    Family History Family History  Problem Relation Age of Onset  . Asthma Mother   . Gout Father   . Diabetes Paternal Grandmother   . Anesthesia problems Neg Hx     Social History Social History   Tobacco Use  . Smoking status: Never Smoker  . Smokeless tobacco: Never Used  Substance Use Topics  . Alcohol use: No    Comment: occ  . Drug use: No     Allergies   Penicillins and Benadryl [diphenhydramine]   Review of Systems Review of Systems  Endocrine: Positive for polyuria.  Skin:       Abscess left groin  All other systems reviewed and are negative.    Physical Exam Updated Vital Signs BP (!) 150/105 (BP Location: Right Arm)   Pulse 89   Temp 98 F (36.7 C) (Oral)   Resp 10   Ht _0  (1.626 m)   Wt 54.4 kg (120 lb)   SpO2 99%   BMI 20.60 kg/m   Physical Exam  Constitutional: She is oriented to person, place, and time. She appears well-developed and well-nourished.  HENT:  Head: Normocephalic and atraumatic.  Right Ear: External ear normal.  Left Ear: External ear normal.  Nose: Nose normal.  Mouth/Throat: Mucous membranes are dry.  Eyes: Conjunctivae and EOM are normal. Pupils are equal, round, and reactive to light.  Neck: Normal range of motion. Neck supple.  Cardiovascular: Regular rhythm, normal heart sounds and intact distal pulses. Tachycardia present.  Pulmonary/Chest: Effort normal and breath sounds normal.  Abdominal: Soft. Bowel sounds are normal.  Genitourinary:  Genitourinary Comments: Small abscess left groin crease  Musculoskeletal: Normal range of motion.  Neurological: She is alert and oriented  to person, place, and time.  Skin: Skin is warm. Capillary refill takes less than 2 seconds.  Psychiatric: She has a normal mood and affect. Her behavior is normal. Judgment and thought content normal.  Nursing note and vitals reviewed.    ED Treatments / Results  Labs (all labs ordered are listed, but only abnormal results are displayed) Labs Reviewed  BASIC METABOLIC PANEL - Abnormal; Notable for the following components:      Result Value   Sodium 123 (*)    Chloride 91 (*)    CO2 19 (*)    Glucose, Bld 806 (*)    All other components within normal limits  CBC - Abnormal; Notable for the following components:   WBC 12.4 (*)    All other components within normal limits  URINALYSIS, ROUTINE W REFLEX MICROSCOPIC - Abnormal; Notable for the following components:   APPearance HAZY (*)    Glucose,  UA >=500 (*)    Hgb urine dipstick LARGE (*)    Leukocytes, UA SMALL (*)    Bacteria, UA RARE (*)    Squamous Epithelial / LPF 6-30 (*)    All other components within normal limits  BLOOD GAS, VENOUS - Abnormal; Notable for the following components:   pCO2, Ven 42.3 (*)    Acid-base deficit 2.5 (*)    All other components within normal limits  CBG MONITORING, ED - Abnormal; Notable for the following components:   Glucose-Capillary >600 (*)    All other components within normal limits  CBG MONITORING, ED - Abnormal; Notable for the following components:   Glucose-Capillary 542 (*)    All other components within normal limits  CBG MONITORING, ED - Abnormal; Notable for the following components:   Glucose-Capillary 381 (*)    All other components within normal limits  CBG MONITORING, ED - Abnormal; Notable for the following components:   Glucose-Capillary 349 (*)    All other components within normal limits  I-STAT BETA HCG BLOOD, ED ([facility], [facility], [facility] ONLY)  CBG MONITORING, ED  CBG MONITORING, ED  CBG MONITORING, ED    EKG  EKG Interpretation None       Radiology No results  found.  Procedures .[place] KitchenIncision and Drainage Date/Time: 05/25/2017 4:25 PM Performed by: Isla Pence, MD Authorized by: Isla Pence, MD   Consent:    Consent obtained:  Verbal   Consent given by:  Patient   Risks discussed:  Bleeding, incomplete drainage and pain   Alternatives discussed:  No treatment Location:    Type:  Abscess   Location:  Anogenital   Anogenital location:  [place] Pre-procedure details:    Skin preparation:  Betadine Anesthesia (see MAR for exact dosages):    Anesthesia method:  Local infiltration   Local anesthetic:  Lidocaine 2% WITH epi Procedure type:    Complexity:  Simple Procedure details:    Incision types:  Cruciate   Scalpel blade:  11   Wound management:  Probed and deloculated   Drainage:  Purulent   Drainage amount:  Moderate   Wound treatment:  Wound left open   Packing materials:  None Post-procedure details:    Patient tolerance of procedure:  Tolerated well, no immediate complications   (including critical care time)  Medications Ordered in ED Medications  insulin regular (NOVOLIN R,HUMULIN R) 100 Units in sodium chloride 0.9 % 100 mL (1 Units/mL) infusion (3.1 Units/hr Intravenous Rate/Dose Change 05/25/17 1842)  potassium chloride 10 mEq in 100 mL IVPB (10 mEq Intravenous New Bag/Given 05/25/17 1842)  dextrose 5 %-0.45 % sodium chloride infusion ( Intravenous Hold 05/25/17 1640)  sodium chloride 0.9 % bolus 1,000 mL (0 mLs Intravenous Stopped 05/25/17 1641)  lidocaine-EPINEPHrine (XYLOCAINE W/EPI) 2 %-1:200000 (PF) injection 10 mL (10 mLs Infiltration Given 05/25/17 1541)  sulfamethoxazole-trimethoprim (BACTRIM DS,SEPTRA DS) 800-160 MG per tablet 1 tablet (1 tablet Oral Given 05/25/17 1713)  HYDROcodone-acetaminophen (NORCO/VICODIN) 5-325 MG per tablet 1 tablet (1 tablet Oral Given 05/25/17 1755)  sodium chloride 0.9 % bolus 1,000 mL (1,000 mLs Intravenous New Bag/Given 05/25/17 1841)     Initial Impression / Assessment and Plan /  ED Course  I have reviewed the triage vital signs and the nursing notes.  Pertinent labs & imaging results that were available during my care of the patient were reviewed by me and considered in my medical decision making (see chart for details).    CRITICAL CARE Performed by: Isla Pence  Total critical care time: 30 minutes  Critical care time was exclusive of separately billable procedures and treating other patients.  Critical care was necessary to treat or prevent imminent or life-threatening deterioration.  Critical care was time spent personally by me on the following activities: development of treatment plan with patient and/or surrogate as well as nursing, discussions with consultants, evaluation of patient's response to treatment, examination of patient, obtaining history from patient or surrogate, ordering and performing treatments and interventions, ordering and review of laboratory studies, ordering and review of radiographic studies, pulse oximetry and re-evaluation of patient's condition.  Pt has cleared ketones and is no longer acidotic after IVFs and insulin.  Pt is feeling much better.  She is given rx for glucometer and strips as well.  She is encouraged to take her insulin as her doctor rx'd.  She knows to return if worse. Final Clinical Impressions(s) / ED Diagnoses   Final diagnoses:  Abscess  Poorly controlled type 1 diabetes mellitus [facility])    ED Discharge Orders        Ordered    sulfamethoxazole-trimethoprim (BACTRIM DS,SEPTRA DS) 800-160 MG tablet  2 times daily     05/25/17 1935    HYDROcodone-acetaminophen (NORCO/VICODIN) 5-325 MG tablet  Every 4 hours PRN     05/25/17 1935    blood glucose meter kit and supplies KIT     05/25/17 Roanna Banning, MD 05/25/17 [phone]

## 2017-05-25 NOTE — ED Triage Notes (Signed)
Pt comes in with complaints of an abscess on her left panty line that she first noticed yesterday.  Pt also states her glucometer has been broken so she has been unable to take her sugar.  Reads "high" on meter in ED.  Took her normal 40 of Lantus and 10 U of novolog for meal coverage.  Pt does have a primary care doctor.

## 2017-05-25 NOTE — Discharge Instructions (Signed)
Take your insulin as directed ?

## 2017-05-30 LAB — BLOOD GAS, VENOUS
ACID-BASE DEFICIT: 2.5 mmol/L — AB (ref 0.0–2.0)
Bicarbonate: 22.5 mmol/L (ref 20.0–28.0)
O2 Saturation: 52.9 %
Patient temperature: 98.6
pCO2, Ven: 42.3 mmHg — ABNORMAL LOW (ref 44.0–60.0)
pH, Ven: 7.347 (ref 7.250–7.430)

## 2017-06-19 ENCOUNTER — Ambulatory Visit: Payer: Self-pay | Attending: Family Medicine | Admitting: Family Medicine

## 2017-06-19 ENCOUNTER — Encounter: Payer: Self-pay | Admitting: Family Medicine

## 2017-06-19 VITALS — BP 118/78 | HR 104 | Temp 98.1°F | Ht 63.0 in | Wt 133.4 lb

## 2017-06-19 DIAGNOSIS — IMO0002 Reserved for concepts with insufficient information to code with codable children: Secondary | ICD-10-CM

## 2017-06-19 DIAGNOSIS — E108 Type 1 diabetes mellitus with unspecified complications: Secondary | ICD-10-CM

## 2017-06-19 DIAGNOSIS — Z888 Allergy status to other drugs, medicaments and biological substances status: Secondary | ICD-10-CM | POA: Insufficient documentation

## 2017-06-19 DIAGNOSIS — Z880 Allergy status to penicillin: Secondary | ICD-10-CM | POA: Insufficient documentation

## 2017-06-19 DIAGNOSIS — E1065 Type 1 diabetes mellitus with hyperglycemia: Secondary | ICD-10-CM | POA: Insufficient documentation

## 2017-06-19 DIAGNOSIS — Z794 Long term (current) use of insulin: Secondary | ICD-10-CM | POA: Insufficient documentation

## 2017-06-19 DIAGNOSIS — E1040 Type 1 diabetes mellitus with diabetic neuropathy, unspecified: Secondary | ICD-10-CM | POA: Insufficient documentation

## 2017-06-19 LAB — GLUCOSE, POCT (MANUAL RESULT ENTRY): POC Glucose: 213 mg/dl — AB (ref 70–99)

## 2017-06-19 MED ORDER — HUMALOG KWIKPEN 100 UNIT/ML ~~LOC~~ SOPN: PEN_INJECTOR | SUBCUTANEOUS | 3 refills | Status: DC

## 2017-06-19 MED ORDER — INSULIN GLARGINE 100 UNIT/ML ~~LOC~~ SOLN: 48.0000 [IU] | Freq: Every day | SUBCUTANEOUS | 11 refills | Status: DC

## 2017-06-19 NOTE — Patient Instructions (Signed)
Diabetes Mellitus and Nutrition When you have diabetes (diabetes mellitus), it is very important to have healthy eating habits because your blood sugar (glucose) levels are greatly affected by what you eat and drink. Eating healthy foods in the appropriate amounts, at about the same times every day, can help you:  Control your blood glucose.  Lower your risk of heart disease.  Improve your blood pressure.  Reach or maintain a healthy weight.  Every person with diabetes is different, and each person has different needs for a meal plan. Your health care provider may recommend that you work with a diet and nutrition specialist (dietitian) to make a meal plan that is best for you. Your meal plan may vary depending on factors such as:  The calories you need.  The medicines you take.  Your weight.  Your blood glucose, blood pressure, and cholesterol levels.  Your activity level.  Other health conditions you have, such as heart or kidney disease.  How do carbohydrates affect me? Carbohydrates affect your blood glucose level more than any other type of food. Eating carbohydrates naturally increases the amount of glucose in your blood. Carbohydrate counting is a method for keeping track of how many carbohydrates you eat. Counting carbohydrates is important to keep your blood glucose at a healthy level, especially if you use insulin or take certain oral diabetes medicines. It is important to know how many carbohydrates you can safely have in each meal. This is different for every person. Your dietitian can help you calculate how many carbohydrates you should have at each meal and for snack. Foods that contain carbohydrates include:  Bread, cereal, rice, pasta, and crackers.  Potatoes and corn.  Peas, beans, and lentils.  Milk and yogurt.  Fruit and juice.  Desserts, such as cakes, cookies, ice cream, and candy.  How does alcohol affect me? Alcohol can cause a sudden decrease in blood  glucose (hypoglycemia), especially if you use insulin or take certain oral diabetes medicines. Hypoglycemia can be a life-threatening condition. Symptoms of hypoglycemia (sleepiness, dizziness, and confusion) are similar to symptoms of having too much alcohol. If your health care provider says that alcohol is safe for you, follow these guidelines:  Limit alcohol intake to no more than 1 drink per day for nonpregnant women and 2 drinks per day for men. One drink equals 12 oz of beer, 5 oz of wine, or 1 oz of hard liquor.  Do not drink on an empty stomach.  Keep yourself hydrated with water, diet soda, or unsweetened iced tea.  Keep in mind that regular soda, juice, and other mixers may contain a lot of sugar and must be counted as carbohydrates.  What are tips for following this plan? Reading food labels  Start by checking the serving size on the label. The amount of calories, carbohydrates, fats, and other nutrients listed on the label are based on one serving of the food. Many foods contain more than one serving per package.  Check the total grams (g) of carbohydrates in one serving. You can calculate the number of servings of carbohydrates in one serving by dividing the total carbohydrates by 15. For example, if a food has 30 g of total carbohydrates, it would be equal to 2 servings of carbohydrates.  Check the number of grams (g) of saturated and trans fats in one serving. Choose foods that have low or no amount of these fats.  Check the number of milligrams (mg) of sodium in one serving. Most people   should limit total sodium intake to less than 2,300 mg per day.  Always check the nutrition information of foods labeled as "low-fat" or "nonfat". These foods may be higher in added sugar or refined carbohydrates and should be avoided.  Talk to your dietitian to identify your daily goals for nutrients listed on the label. Shopping  Avoid buying canned, premade, or processed foods. These  foods tend to be high in fat, sodium, and added sugar.  Shop around the outside edge of the grocery store. This includes fresh fruits and vegetables, bulk grains, fresh meats, and fresh dairy. Cooking  Use low-heat cooking methods, such as baking, instead of high-heat cooking methods like deep frying.  Cook using healthy oils, such as olive, canola, or sunflower oil.  Avoid cooking with butter, cream, or high-fat meats. Meal planning  Eat meals and snacks regularly, preferably at the same times every day. Avoid going long periods of time without eating.  Eat foods high in fiber, such as fresh fruits, vegetables, beans, and whole grains. Talk to your dietitian about how many servings of carbohydrates you can eat at each meal.  Eat 4-6 ounces of lean protein each day, such as lean meat, chicken, fish, eggs, or tofu. 1 ounce is equal to 1 ounce of meat, chicken, or fish, 1 egg, or 1/4 cup of tofu.  Eat some foods each day that contain healthy fats, such as avocado, nuts, seeds, and fish. Lifestyle   Check your blood glucose regularly.  Exercise at least 30 minutes 5 or more days each week, or as told by your health care provider.  Take medicines as told by your health care provider.  Do not use any products that contain nicotine or tobacco, such as cigarettes and e-cigarettes. If you need help quitting, ask your health care provider.  Work with a counselor or diabetes educator to identify strategies to manage stress and any emotional and social challenges. What are some questions to ask my health care provider?  Do I need to meet with a diabetes educator?  Do I need to meet with a dietitian?  What number can I call if I have questions?  When are the best times to check my blood glucose? Where to find more information:  American Diabetes Association: diabetes.org/food-and-fitness/food  Academy of Nutrition and Dietetics:  www.eatright.org/resources/health/diseases-and-conditions/diabetes  National Institute of Diabetes and Digestive and Kidney Diseases (NIH): www.niddk.nih.gov/health-information/diabetes/overview/diet-eating-physical-activity Summary  A healthy meal plan will help you control your blood glucose and maintain a healthy lifestyle.  Working with a diet and nutrition specialist (dietitian) can help you make a meal plan that is best for you.  Keep in mind that carbohydrates and alcohol have immediate effects on your blood glucose levels. It is important to count carbohydrates and to use alcohol carefully. This information is not intended to replace advice given to you by your health care provider. Make sure you discuss any questions you have with your health care provider. Document Released: 01/11/2005 Document Revised: 05/21/2016 Document Reviewed: 05/21/2016 Elsevier Interactive Patient Education  2018 [employer]  

## 2017-06-20 LAB — HEMOGLOBIN A1C
ESTIMATED AVERAGE GLUCOSE: 398 mg/dL
HEMOGLOBIN A1C: 15.5 % — AB (ref 4.8–5.6)

## 2017-06-20 NOTE — Progress Notes (Signed)
Subjective:  Patient ID: Ashley Freeman, female    DOB: 07-May-1990  Age: 27 y.o. MRN: [id number]  CC: Diabetes   HPI Ashley Freeman s a 27 year old female with a history of type 1 diabetes mellitus (A1c>15.5), diabetic neuropathy presents today for follow-up visit. She had a visit to the ED at [facility] 3 weeks ago for management of a vulvar abscess at which time she underwent I&D and was placed on Bactrim.  She also received IV fluids and NovoLog for hyperglycemia.  Today she reports resolution of the abscess. Her blood sugar log reveals labile sugars in 300-570 range and on one occasion a blood sugar of 71 at 6 PM.  She endorses compliance with 40 units of Lantus and Humalog 5 units 3 times daily.  She has not been too compliant with a diabetic diet. She has no additional concerns today.  Past Medical History:  Diagnosis Date  . Diabetes mellitus 2001   Diagnosed at age 39 ; Type I  . Diarrhea 05/30/2016  . DKA (diabetic ketoacidoses) ([place]) 08/19/2013  . DKA (diabetic ketoacidoses) ([place]) 05/30/2016  . Gonorrhea 08/2011   Treated in 09/2011    Past Surgical History:  Procedure Laterality Date  . INCISION AND DRAINAGE PERIRECTAL ABSCESS Right 08/18/2013   Procedure: IRRIGATION AND DEBRIDEMENT GLUTEAL ABSCESS;  Surgeon: Ralene Ok, MD;  Location: [place];  Service: General;  Laterality: Right;  . INCISION AND DRAINAGE PERIRECTAL ABSCESS Right 09/19/2013   Procedure: IRRIGATION AND DEBRIDEMENT RIGHT GLUTEAL AND LABIAL ABSCESSES;  Surgeon: Ralene Ok, MD;  Location: [place];  Service: General;  Laterality: Right;  . INCISION AND DRAINAGE PERIRECTAL ABSCESS Right 09/24/2013   Procedure: IRRIGATION AND DEBRIDEMENT PERIRECTAL ABSCESS;  Surgeon: Gwenyth Ober, MD;  Location: [place];  Service: General;  Laterality: Right;  . NO PAST SURGERIES      Allergies  Allergen Reactions  . Penicillins Hives and Rash    Has patient had a PCN reaction causing immediate rash,  facial/tongue/throat swelling, SOB or lightheadedness with hypotension: Yes Has patient had a PCN reaction causing severe rash involving mucus membranes or skin necrosis: No Has patient had a PCN reaction that required hospitalization: Yes Has patient had a PCN reaction occurring within the last 10 years: No If all of the above answers are "NO", then may proceed with Cephalosporin use.  . Benadryl [Diphenhydramine] Itching      Outpatient Medications Prior to Visit  Medication Sig Dispense Refill  . HUMALOG KWIKPEN 100 UNIT/ML KiwkPen Inject 12 Units into the skin 3 (three) times daily.  3  . insulin glargine (LANTUS) 100 UNIT/ML injection Inject 0.4 mLs (40 Units total) into the skin daily. 10 mL 11  . blood glucose meter kit and supplies KIT Dispense based on patient and insurance preference. Use up to four times daily as directed. (FOR ICD-9 250.00, 250.01). (Patient not taking: Reported on 06/19/2017) 1 each 0  . doxycycline (VIBRAMYCIN) 100 MG capsule Take 1 capsule (100 mg total) by mouth 2 (two) times daily. One po bid x 7 days (Patient not taking: Reported on 05/25/2017) 14 capsule 0  . HYDROcodone-acetaminophen (NORCO/VICODIN) 5-325 MG tablet Take 1 tablet by mouth every 4 (four) hours as needed. (Patient not taking: Reported on 06/19/2017) 10 tablet 0  . insulin aspart (NOVOLOG FLEXPEN) 100 UNIT/ML FlexPen Inject 0-12 Units into the skin 3 (three) times daily with meals. As per sliding scale (Patient not taking: Reported on 05/25/2017) 15 mL 11  .  insulin aspart (NOVOLOG FLEXPEN) 100 UNIT/ML FlexPen Follow previous sliding scale at home. (Patient not taking: Reported on 06/19/2017) 15 mL 11   Facility-Administered Medications Prior to Visit  Medication Dose Route Frequency Provider Last Rate Last Dose  . Insulin Pen Needle (NOVOFINE) 200 each  20 packet Subcutaneous PRN Allie Bossier, MD        ROS Review of Systems  Constitutional: Negative for activity change, appetite change and  fatigue.  HENT: Negative for congestion, sinus pressure and sore throat.   Eyes: Negative for visual disturbance.  Respiratory: Negative for cough, chest tightness, shortness of breath and wheezing.   Cardiovascular: Negative for chest pain and palpitations.  Gastrointestinal: Negative for abdominal distention, abdominal pain and constipation.  Endocrine: Negative for polydipsia.  Genitourinary: Negative for dysuria and frequency.  Musculoskeletal: Negative for arthralgias and back pain.  Skin: Negative for rash.  Neurological: Negative for tremors, light-headedness and numbness.  Hematological: Does not bruise/bleed easily.  Psychiatric/Behavioral: Negative for agitation and behavioral problems.    Objective:  BP 118/78   Pulse (!) 104   Temp 98.1 F (36.7 C) (Oral)   Ht '5\' 3"'  (1.6 m)   Wt 133 lb 6.4 oz (60.5 kg)   SpO2 100%   BMI 23.63 kg/m   BP/Weight 06/19/2017 05/25/2017 19/50/9326  Systolic BP 712 458 099  Diastolic BP 78 84 94  Wt. (Lbs) 133.4 120 124.38  BMI 23.63 20.6 22.03     Physical Exam  Constitutional: She is oriented to person, place, and time. She appears well-developed and well-nourished.  Cardiovascular: Normal rate, normal heart sounds and intact distal pulses.  No murmur heard. Pulmonary/Chest: Effort normal and breath sounds normal. She has no wheezes. She has no rales. She exhibits no tenderness.  Abdominal: Soft. Bowel sounds are normal. She exhibits no distension and no mass. There is no tenderness.  Musculoskeletal: Normal range of motion.  Neurological: She is alert and oriented to person, place, and time.  Skin: Skin is warm and dry.  Psychiatric: She has a normal mood and affect.     Lab Results  Component Value Date   HGBA1C >15.5 (H) 10/14/2016    Assessment & Plan:   1. Diabetes mellitus type 1, uncontrolled, with complications (HCC) Uncontrolled with A1c of greater than 15.5 Blood sugar log reveals hypoglycemia with one  hypoglycemic episode Increase Lantus to 48 units and switch from fixed regimen of short acting insulin to sliding scale Counseled on Diabetic diet, my plate method, 833 minutes of moderate intensity exercise/week Keep blood sugar logs with fasting goals of 80-120 mg/dl, random of less than 180 and in the event of sugars less than 60 mg/dl or greater than 400 mg/dl please notify the clinic ASAP. It is recommended that you undergo annual eye exams and annual foot exams. Pneumonia vaccine is recommended. - POCT glucose (manual entry) - insulin glargine (LANTUS) 100 UNIT/ML injection; Inject 0.48 mLs (48 Units total) into the skin daily.  Dispense: 30 mL; Refill: 11 - HUMALOG KWIKPEN 100 UNIT/ML KiwkPen; 0-12 units subcutaneously 3 times daily before meals as per sliding scale  Dispense: 15 mL; Refill: 3 - Hemoglobin A1c   Meds ordered this encounter  Medications  . insulin glargine (LANTUS) 100 UNIT/ML injection    Sig: Inject 0.48 mLs (48 Units total) into the skin daily.    Dispense:  30 mL    Refill:  11    Discontinue previous dose  . HUMALOG KWIKPEN 100 UNIT/ML KiwkPen  Sig: 0-12 units subcutaneously 3 times daily before meals as per sliding scale    Dispense:  15 mL    Refill:  3    Follow-up: Return in about 3 months (around 09/16/2017) for follow up on diabetes mellitus.   Charlott Rakes MD

## 2017-06-28 ENCOUNTER — Telehealth: Payer: Self-pay

## 2017-06-28 NOTE — Telephone Encounter (Signed)
Patient was called and informed of lab results. 

## 2017-07-27 ENCOUNTER — Encounter (HOSPITAL_COMMUNITY): Payer: Self-pay | Admitting: Emergency Medicine

## 2017-07-27 ENCOUNTER — Emergency Department (HOSPITAL_COMMUNITY)
Admission: EM | Admit: 2017-07-27 | Discharge: 2017-07-28 | Disposition: A | Discharge status: Home / Self Care | Payer: Self-pay | Attending: Emergency Medicine | Admitting: Emergency Medicine

## 2017-07-27 DIAGNOSIS — R739 Hyperglycemia, unspecified: Secondary | ICD-10-CM

## 2017-07-27 DIAGNOSIS — E1065 Type 1 diabetes mellitus with hyperglycemia: Secondary | ICD-10-CM | POA: Insufficient documentation

## 2017-07-27 DIAGNOSIS — Z794 Long term (current) use of insulin: Secondary | ICD-10-CM | POA: Insufficient documentation

## 2017-07-27 LAB — BASIC METABOLIC PANEL
ANION GAP: 13 (ref 5–15)
BUN: 16 mg/dL (ref 6–20)
CO2: 20 mmol/L — AB (ref 22–32)
Calcium: 9.1 mg/dL (ref 8.9–10.3)
Chloride: 92 mmol/L — ABNORMAL LOW (ref 101–111)
Creatinine, Ser: 0.93 mg/dL (ref 0.44–1.00)
GFR calc Af Amer: >60 mL/min (ref >60)
GFR calc non Af Amer: >60 mL/min (ref >60)
GLUCOSE: 769 mg/dL — AB (ref 65–99)
POTASSIUM: 4.3 mmol/L (ref 3.5–5.1)
Sodium: 125 mmol/L — ABNORMAL LOW (ref 135–145)

## 2017-07-27 LAB — CBG MONITORING, ED: Glucose-Capillary: >600 mg/dL (ref 65–99)

## 2017-07-27 LAB — I-STAT BETA HCG BLOOD, ED (MC, WL, AP ONLY): I-stat hCG, quantitative: <5 m[IU]/mL (ref <5)

## 2017-07-27 MED ORDER — SODIUM CHLORIDE 0.9 % IV SOLN
INTRAVENOUS | Status: DC
  Administered 2017-07-27: 5.4 [IU]/h via INTRAVENOUS
  Filled 2017-07-27: qty 1

## 2017-07-27 MED ORDER — DEXTROSE-NACL 5-0.45 % IV SOLN
INTRAVENOUS | Status: DC
  Administered 2017-07-28: 03:00:00 via INTRAVENOUS

## 2017-07-27 NOTE — ED Notes (Signed)
Patient very difficult stick, stuck patient twice. Lab called and said the lavender tube was not enough. RN notified.

## 2017-07-27 NOTE — ED Notes (Signed)
Pt has critical glucose of 769

## 2017-07-27 NOTE — ED Provider Notes (Signed)
[facility] DEPT Provider Note   CSN: [id number] Arrival date & time: 07/27/17  1704     History   Chief Complaint Chief Complaint  Patient presents with  . Dizziness  . Hyperglycemia    HPI Ashley Freeman is a 27 y.o. female with a history of type 1 diabetes.  Patient is here for hyperglycemia.  She states that she had a really busy day and forgot to take her insulin.  She noticed tonight when she got home that her sugars were unreadable on her monitor.  She complains of weakness, dizziness, polyuria, polydipsia.  Patient states that she is also menstruating and feels irritable and is having some mild cramping.  She denies fevers, chills, urinary symptoms other than the polyuria.  HPI  Past Medical History:  Diagnosis Date  . Diabetes mellitus 2001   Diagnosed at age 70 ; Type I  . Diarrhea 05/30/2016  . DKA (diabetic ketoacidoses) ([place]) 08/19/2013  . DKA (diabetic ketoacidoses) ([place]) 05/30/2016  . Gonorrhea 08/2011   Treated in 09/2011    Patient Active Problem List   Diagnosis Date Noted  . DKA (diabetic ketoacidosis) ([place]) 12/29/2016  . Inguinal abscess   . History of trichomoniasis 05/31/2016  . Diarrhea 05/31/2016  . DKA (diabetic ketoacidoses) ([place]) 05/30/2016  . Hyperlipidemia 03/28/2016  . Diabetic neuropathy ([employer]) 01/16/2016  . DKA, type 1 ([place]) 11/24/2015  . BV (bacterial vaginosis) 11/24/2015  . Chest pain 11/24/2015  . AKI (acute kidney injury) ([place]) 07/26/2014  . Infection of urinary tract 04/08/2014  . PID (acute pelvic inflammatory disease) 04/06/2014  . Diabetic ketoacidosis without coma associated with type 1 diabetes mellitus ([place]) 09/19/2013  . Infected cyst of Bartholin's gland duct 09/19/2013  . Bartholin's gland abscess 09/19/2013  . Sepsis ([place]) 09/19/2013  . Abscess, gluteal, right 08/24/2013  . Health care maintenance 08/24/2013  . Hypophosphatemia 08/18/2013  . Hypomagnesemia 08/18/2013  .  Leukocytosis, unspecified 08/17/2013  . Fever, unspecified 08/17/2013  . H/O medication noncompliance 08/12/2013  . Hyponatremia 08/04/2012  . Anemia 02/19/2012  . Hyperglycemia 11/06/2011  . Tachycardia 10/17/2011  . Diabetes mellitus type 1, uncontrolled, with complications ([place]) 99/83/3825    Past Surgical History:  Procedure Laterality Date  . INCISION AND DRAINAGE PERIRECTAL ABSCESS Right 08/18/2013   Procedure: IRRIGATION AND DEBRIDEMENT GLUTEAL ABSCESS;  Surgeon: Ralene Ok, MD;  Location: [place];  Service: General;  Laterality: Right;  . INCISION AND DRAINAGE PERIRECTAL ABSCESS Right 09/19/2013   Procedure: IRRIGATION AND DEBRIDEMENT RIGHT GLUTEAL AND LABIAL ABSCESSES;  Surgeon: Ralene Ok, MD;  Location: [place];  Service: General;  Laterality: Right;  . INCISION AND DRAINAGE PERIRECTAL ABSCESS Right 09/24/2013   Procedure: IRRIGATION AND DEBRIDEMENT PERIRECTAL ABSCESS;  Surgeon: Gwenyth Ober, MD;  Location: [place];  Service: General;  Laterality: Right;  . NO PAST SURGERIES       OB History    Gravida  2   Para  0   Term      Preterm      AB  1   Living  0     SAB  1   TAB      Ectopic      Multiple      Live Births               Home Medications    Prior to Admission medications   Medication Sig Start Date End Date Taking? Authorizing Provider  blood glucose meter kit and supplies KIT Dispense  based on patient and insurance preference. Use up to four times daily as directed. (FOR ICD-9 250.00, 250.01). Patient not taking: Reported on 06/19/2017 05/25/17   Isla Pence, MD  doxycycline (VIBRAMYCIN) 100 MG capsule Take 1 capsule (100 mg total) by mouth 2 (two) times daily. One po bid x 7 days Patient not taking: Reported on 05/25/2017 02/24/17   Street, [place], PA-C  HUMALOG KWIKPEN 100 UNIT/ML KiwkPen 0-12 units subcutaneously 3 times daily before meals as per sliding scale 06/19/17   Charlott Rakes, MD  HYDROcodone-acetaminophen  (NORCO/VICODIN) 5-325 MG tablet Take 1 tablet by mouth every 4 (four) hours as needed. Patient not taking: Reported on 06/19/2017 05/25/17   Isla Pence, MD  insulin glargine (LANTUS) 100 UNIT/ML injection Inject 0.48 mLs (48 Units total) into the skin daily. 06/19/17   Charlott Rakes, MD    Family History Family History  Problem Relation Age of Onset  . Asthma Mother   . Gout Father   . Diabetes Paternal Grandmother   . Anesthesia problems Neg Hx     Social History Social History   Tobacco Use  . Smoking status: Never Smoker  . Smokeless tobacco: Never Used  Substance Use Topics  . Alcohol use: No    Comment: occ  . Drug use: No     Allergies   Penicillins and Benadryl [diphenhydramine]   Review of Systems Review of Systems Ten systems reviewed and are negative for acute change, except as noted in the HPI.    Physical Exam Updated Vital Signs BP 124/85 (BP Location: Right Arm)   Pulse (!) 117   Temp 98 F (36.7 C) (Oral)   Resp 18   Ht _0  (1.626 m)   LMP 07/27/2017   SpO2 97%   BMI 22.90 kg/m   Physical Exam  Constitutional: She is oriented to person, place, and time. She appears well-developed and well-nourished. No distress.  HENT:  Head: Normocephalic and atraumatic.  Eyes: Conjunctivae are normal. No scleral icterus.  Neck: Normal range of motion.  Cardiovascular: Normal rate, regular rhythm and normal heart sounds. Exam reveals no gallop and no friction rub.  No murmur heard. Pulmonary/Chest: Effort normal and breath sounds normal. No respiratory distress.  Abdominal: Soft. Bowel sounds are normal. She exhibits no distension and no mass. There is no tenderness. There is no guarding.  Neurological: She is alert and oriented to person, place, and time.  Skin: Skin is warm and dry. She is not diaphoretic.  Psychiatric: Her behavior is normal.  Nursing note and vitals reviewed.    ED Treatments / Results  Labs (all labs ordered are listed,  but only abnormal results are displayed) Labs Reviewed  BASIC METABOLIC PANEL - Abnormal; Notable for the following components:      Result Value   Sodium 125 (*)    Chloride 92 (*)    CO2 20 (*)    Glucose, Bld 769 (*)    All other components within normal limits  CBG MONITORING, ED - Abnormal; Notable for the following components:   Glucose-Capillary >600 (*)    All other components within normal limits  URINALYSIS, ROUTINE W REFLEX MICROSCOPIC  CBC  I-STAT BETA HCG BLOOD, ED ([facility], [facility], [facility] ONLY)    EKG None  Radiology No results found.  Procedures Procedures (including critical care time)  Medications Ordered in ED Medications  dextrose 5 %-0.45 % sodium chloride infusion (has no administration in time range)  insulin regular (NOVOLIN R,HUMULIN R) 100 Units in  sodium chloride 0.9 % 100 mL (1 Units/mL) infusion (has no administration in time range)     Initial Impression / Assessment and Plan / ED Course  I have reviewed the triage vital signs and the nursing notes.  Pertinent labs & imaging results that were available during my care of the patient were reviewed by me and considered in my medical decision making (see chart for details).     Patient is receiving insulin drip. Will monitor here in the emergency department. Likely secondary to the fact that she did not take her medication today.  I doubt infectious etiology. Patient given in sign out to PA Sanders at shift change with expectant discharge.  Final Clinical Impressions(s) / ED Diagnoses   Final diagnoses:  None    ED Discharge Orders    None       Margarita Mail, PA-C 07/27/17 2358    Pixie Casino, MD 07/28/17 [phone]

## 2017-07-27 NOTE — ED Notes (Signed)
Bed: WA01 Expected date:  Expected time:  Means of arrival:  Comments: 

## 2017-07-27 NOTE — ED Triage Notes (Signed)
Pt reports that her diabetes has been 'acting up" and gap was 16 back in Feb when at PCP. Reports CBGs been reading high even taking her insulin. Reports dizziness, sob when ambulating and laying down.

## 2017-07-27 NOTE — ED Notes (Signed)
Pt. Still in the shower.

## 2017-07-28 LAB — BLOOD GAS, VENOUS
Acid-base deficit: 4.4 mmol/L — ABNORMAL HIGH (ref 0.0–2.0)
BICARBONATE: 20 mmol/L (ref 20.0–28.0)
FIO2: 21
O2 SAT: 93.1 %
PCO2 VEN: 36.4 mmHg — AB (ref 44.0–60.0)
PH VEN: 7.359 (ref 7.250–7.430)
Patient temperature: 98.6
pO2, Ven: 72.5 mmHg — ABNORMAL HIGH (ref 32.0–45.0)

## 2017-07-28 LAB — CBG MONITORING, ED
GLUCOSE-CAPILLARY: 594 mg/dL — AB (ref 65–99)
Glucose-Capillary: 330 mg/dL — ABNORMAL HIGH (ref 65–99)
Glucose-Capillary: 212 mg/dL — ABNORMAL HIGH (ref 65–99)

## 2017-07-28 NOTE — ED Provider Notes (Signed)
Assumed care from PA Harris at shift change.  See prior notes for full H&P.  Briefly, 27 year old female here with hyperglycemia.  Apparently did not take her medication [place] blood sugar in the 700s here with normal anion gap.  Plan: Patient started on glucose stabilizer and given IV fluids.  We will plan for glucose control and can likely discharge.  Results for orders placed or performed during the hospital encounter of 24/46/28  Basic metabolic panel  Result Value Ref Range   Sodium 125 (L) 135 - 145 mmol/L   Potassium 4.3 3.5 - 5.1 mmol/L   Chloride 92 (L) 101 - 111 mmol/L   CO2 20 (L) 22 - 32 mmol/L   Glucose, Bld 769 (HH) 65 - 99 mg/dL   BUN 16 6 - 20 mg/dL   Creatinine, Ser 0.93 0.44 - 1.00 mg/dL   Calcium 9.1 8.9 - 10.3 mg/dL   GFR calc non Af Amer >60 >60 mL/min   GFR calc Af Amer >60 >60 mL/min   Anion gap 13 5 - 15  Blood gas, venous  Result Value Ref Range   FIO2 21.00    pH, Ven 7.359 7.250 - 7.430   pCO2, Ven 36.4 (L) 44.0 - 60.0 mmHg   pO2, Ven 72.5 (H) 32.0 - 45.0 mmHg   Bicarbonate 20.0 20.0 - 28.0 mmol/L   Acid-base deficit 4.4 (H) 0.0 - 2.0 mmol/L   O2 Saturation 93.1 %   Patient temperature 98.6    Collection site VEIN    Drawn by COLLECTED BY NURSE    Sample type VENOUS   CBG monitoring, ED  Result Value Ref Range   Glucose-Capillary >600 (HH) 65 - 99 mg/dL  I-Stat beta hCG blood, ED  Result Value Ref Range   I-stat hCG, quantitative <5.0 <5 mIU/mL   Comment 3          POC CBG, ED  Result Value Ref Range   Glucose-Capillary 594 (HH) 65 - 99 mg/dL   Comment 1 Notify RN   POC CBG, ED  Result Value Ref Range   Glucose-Capillary 330 (H) 65 - 99 mg/dL  CBG monitoring, ED  Result Value Ref Range   Glucose-Capillary 212 (H) 65 - 99 mg/dL   No results found.  2:39 AM Patient has been on glucostabilizer for a few hours, CBG Improved to 212.  Suspect hyperglycemia secondary to not taking her meds today.  Labs  without elevated anion gap, pH is normal.  Feel she is stable for d/c home.  Discussed imprortance of medication compliance.  She understands to return here for any new/acute changes.   Larene Pickett, PA-C 07/28/17 0343    Shanon Rosser, MD 07/28/17 [phone]

## 2017-07-28 NOTE — Discharge Instructions (Signed)
Make sure to take your medications regularly.  Watch your diet. Follow-up with your primary care doctor. Return to the ED for new or worsening symptoms.

## 2017-07-29 IMAGING — DX DG CHEST 1V PORT
1 series · 1 of 1 positions shown · non-contrast
Comparison: September 05, 2016

CLINICAL DATA: Chest pain.

EXAM:
PORTABLE CHEST 1 VIEW

[chest ap]
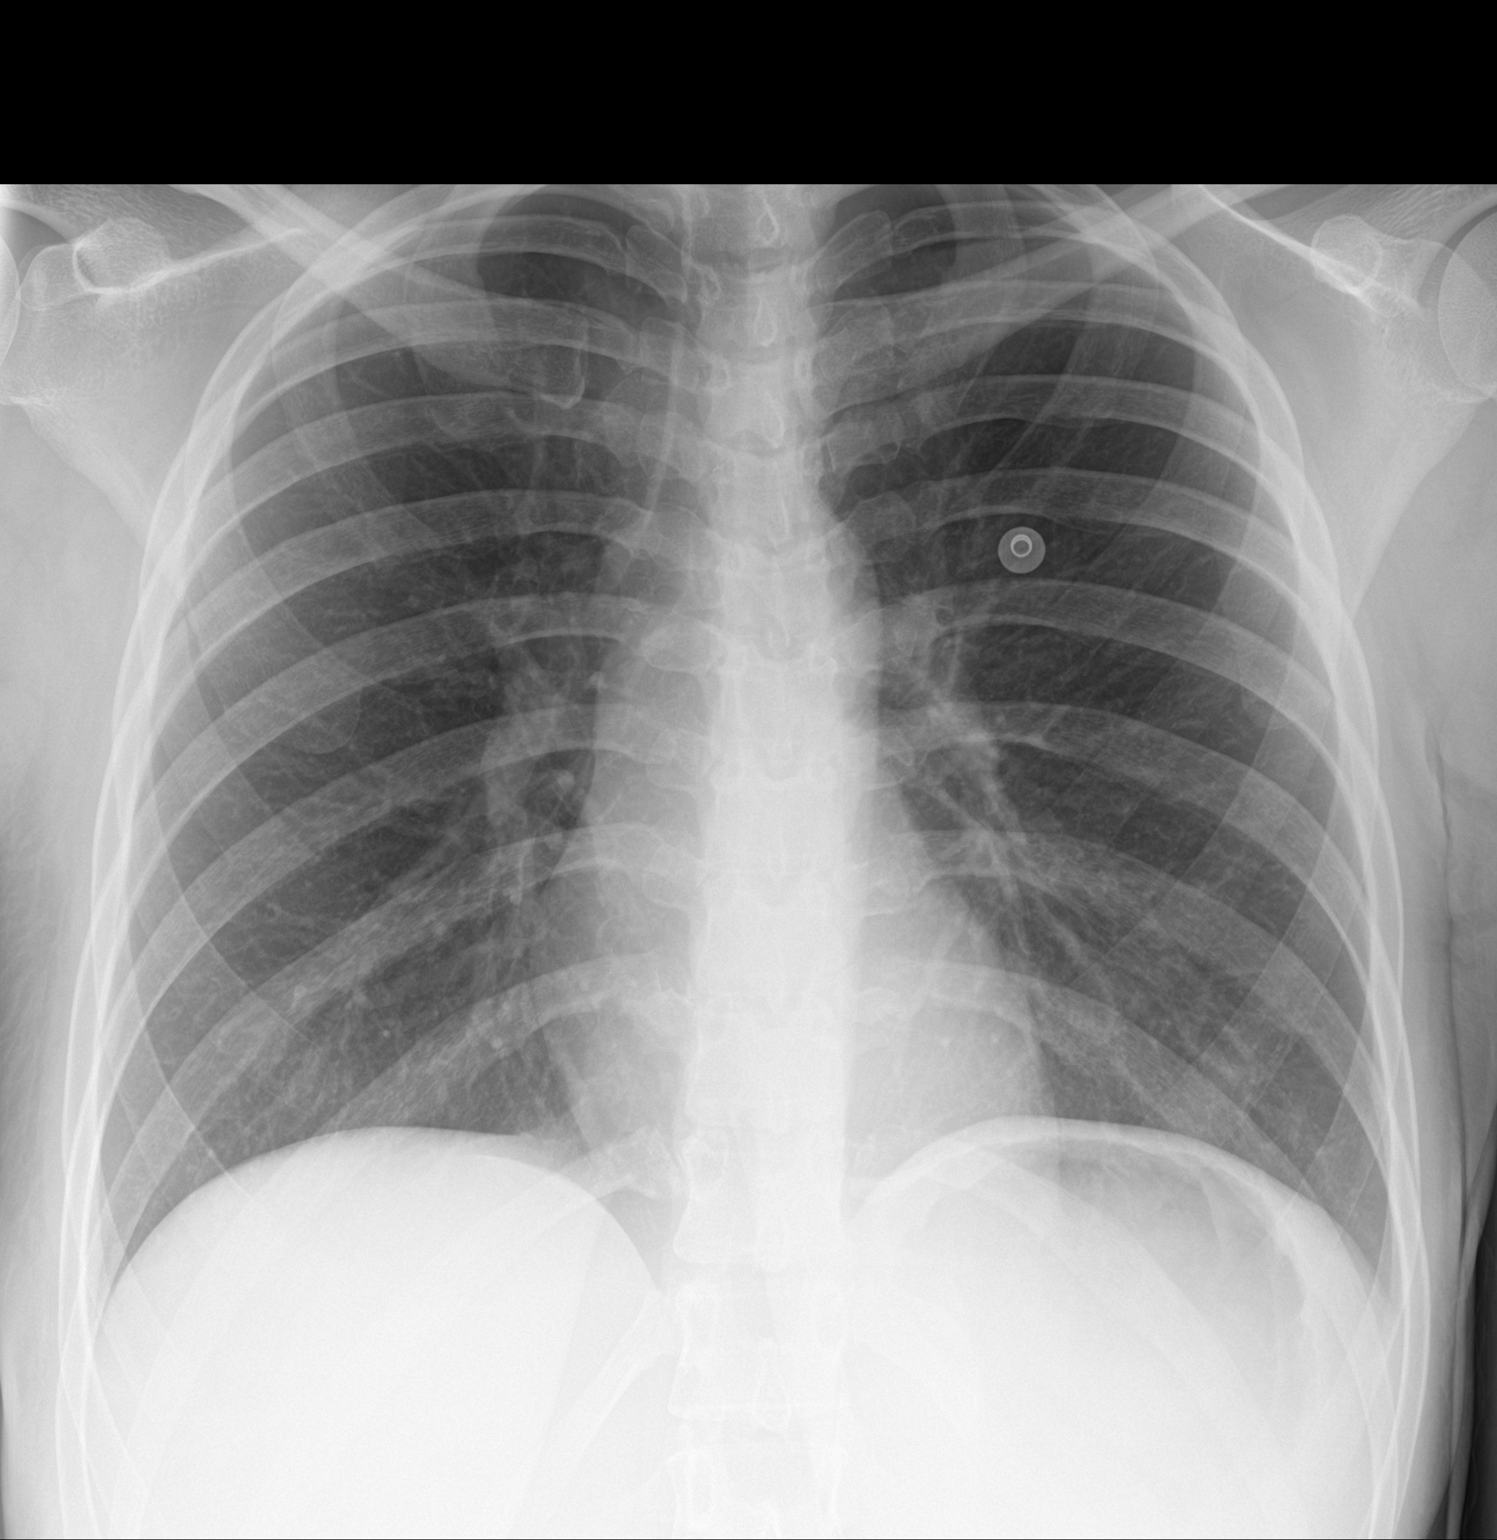

[1 of 1 positions shown; findings below may reference images not displayed]

FINDINGS: The heart size and mediastinal contours are within normal limits.
Both lungs are clear. The visualized skeletal structures are
unremarkable.
IMPRESSION: No active disease.

## 2017-08-12 ENCOUNTER — Other Ambulatory Visit: Payer: Self-pay

## 2017-08-12 MED ORDER — INSULIN GLARGINE 100 UNIT/ML SOLOSTAR PEN: 48.0000 [IU] | PEN_INJECTOR | Freq: Every day | SUBCUTANEOUS | 11 refills | Status: DC

## 2017-08-16 ENCOUNTER — Encounter (HOSPITAL_COMMUNITY): Payer: Self-pay | Admitting: Emergency Medicine

## 2017-08-16 ENCOUNTER — Emergency Department (HOSPITAL_COMMUNITY)
Admission: EM | Admit: 2017-08-16 | Discharge: 2017-08-16 | Disposition: A | Discharge status: Home / Self Care | Payer: Self-pay | Attending: Emergency Medicine | Admitting: Emergency Medicine

## 2017-08-16 DIAGNOSIS — Z794 Long term (current) use of insulin: Secondary | ICD-10-CM | POA: Insufficient documentation

## 2017-08-16 DIAGNOSIS — R197 Diarrhea, unspecified: Secondary | ICD-10-CM | POA: Insufficient documentation

## 2017-08-16 DIAGNOSIS — E108 Type 1 diabetes mellitus with unspecified complications: Secondary | ICD-10-CM

## 2017-08-16 DIAGNOSIS — E1065 Type 1 diabetes mellitus with hyperglycemia: Secondary | ICD-10-CM

## 2017-08-16 DIAGNOSIS — E109 Type 1 diabetes mellitus without complications: Secondary | ICD-10-CM | POA: Insufficient documentation

## 2017-08-16 DIAGNOSIS — IMO0002 Reserved for concepts with insufficient information to code with codable children: Secondary | ICD-10-CM

## 2017-08-16 DIAGNOSIS — R112 Nausea with vomiting, unspecified: Secondary | ICD-10-CM | POA: Insufficient documentation

## 2017-08-16 LAB — CBC
HCT: 40.2 % (ref 36.0–46.0)
Hemoglobin: 13.7 g/dL (ref 12.0–15.0)
MCH: 27.8 pg (ref 26.0–34.0)
MCHC: 34.1 g/dL (ref 30.0–36.0)
MCV: 81.5 fL (ref 78.0–100.0)
Platelets: 372 10*3/uL (ref 150–400)
RBC: 4.93 MIL/uL (ref 3.87–5.11)
RDW: 12.7 % (ref 11.5–15.5)
WBC: 7 10*3/uL (ref 4.0–10.5)

## 2017-08-16 LAB — COMPREHENSIVE METABOLIC PANEL
ALK PHOS: 73 U/L (ref 38–126)
ALT: 15 U/L (ref 14–54)
ANION GAP: 12 (ref 5–15)
AST: 17 U/L (ref 15–41)
Albumin: 4 g/dL (ref 3.5–5.0)
BILIRUBIN TOTAL: 0.7 mg/dL (ref 0.3–1.2)
BUN: 9 mg/dL (ref 6–20)
CALCIUM: 10.3 mg/dL (ref 8.9–10.3)
CO2: 26 mmol/L (ref 22–32)
Chloride: 101 mmol/L (ref 101–111)
Creatinine, Ser: 0.66 mg/dL (ref 0.44–1.00)
GFR calc Af Amer: >60 mL/min (ref >60)
GFR calc non Af Amer: >60 mL/min (ref >60)
Glucose, Bld: 124 mg/dL — ABNORMAL HIGH (ref 65–99)
Potassium: 3.5 mmol/L (ref 3.5–5.1)
Sodium: 139 mmol/L (ref 135–145)
TOTAL PROTEIN: 8.7 g/dL — AB (ref 6.5–8.1)

## 2017-08-16 LAB — URINALYSIS, ROUTINE W REFLEX MICROSCOPIC
Bilirubin Urine: NEGATIVE
Glucose, UA: >=500 mg/dL — AB
Hgb urine dipstick: NEGATIVE
Ketones, ur: NEGATIVE mg/dL
NITRITE: NEGATIVE
Protein, ur: NEGATIVE mg/dL
SPECIFIC GRAVITY, URINE: 1.028 (ref 1.005–1.030)
pH: 6 (ref 5.0–8.0)

## 2017-08-16 LAB — I-STAT CG4 LACTIC ACID, ED
LACTIC ACID, VENOUS: 2.4 mmol/L — AB (ref 0.5–1.9)
Lactic Acid, Venous: 0.89 mmol/L (ref 0.5–1.9)
Lactic Acid, Venous: 2.24 mmol/L (ref 0.5–1.9)

## 2017-08-16 LAB — LIPASE, BLOOD: Lipase: 36 U/L (ref 11–51)

## 2017-08-16 LAB — CBG MONITORING, ED
Glucose-Capillary: 108 mg/dL — ABNORMAL HIGH (ref 65–99)
Glucose-Capillary: 232 mg/dL — ABNORMAL HIGH (ref 65–99)

## 2017-08-16 LAB — I-STAT BETA HCG BLOOD, ED (MC, WL, AP ONLY): I-stat hCG, quantitative: <5 m[IU]/mL (ref <5)

## 2017-08-16 MED ORDER — SODIUM CHLORIDE 0.9 % IV BOLUS
1000.0000 mL | Freq: Once | INTRAVENOUS | Status: AC
  Administered 2017-08-16: 1000 mL via INTRAVENOUS

## 2017-08-16 MED ORDER — ONDANSETRON 4 MG PO TBDP: 4.0000 mg | ORAL_TABLET | Freq: Three times a day (TID) | ORAL | 0 refills | Status: DC | PRN

## 2017-08-16 NOTE — ED Notes (Signed)
Labs shown to [employer]. PCA

## 2017-08-16 NOTE — ED Provider Notes (Signed)
[facility] DEPT Provider Note   CSN: [id number] Arrival date & time: 08/16/17  1611     History   Chief Complaint Chief Complaint  Patient presents with  . Emesis  . Diarrhea    HPI Ashley Freeman is a 27 y.o. female with a history of DM type I, noncompliance, who presents today for evaluation of nausea vomiting and diarrhea.  She reports that she ate at [employer] on Sunday and felt like the meat tasted funny.  She reports the next day that she again having stomach pains which was quickly followed by vomiting and diarrhea.  She reports that she has not checked her sugar in approximately a month and a half, stating that she lost her glucometer during the move.  She reports that she is still taking her insulin.  Patient reports she has not been able to eat or drink anything in the past 24 hours.    Patient is here with her girl friend, when she left the room patient tells me that she is concerned she may be pregnant   HPI  Past Medical History:  Diagnosis Date  . Diabetes mellitus 2001   Diagnosed at age 27 ; Type I  . Diarrhea 05/30/2016  . DKA (diabetic ketoacidoses) ([place]) 08/19/2013  . DKA (diabetic ketoacidoses) ([place]) 05/30/2016  . Gonorrhea 08/2011   Treated in 09/2011    Patient Active Problem List   Diagnosis Date Noted  . DKA (diabetic ketoacidosis) ([place]) 12/29/2016  . Inguinal abscess   . History of trichomoniasis 05/31/2016  . Diarrhea 05/31/2016  . DKA (diabetic ketoacidoses) ([place]) 05/30/2016  . Hyperlipidemia 03/28/2016  . Diabetic neuropathy ([place]) 01/16/2016  . DKA, type 1 ([place]) 11/24/2015  . BV (bacterial vaginosis) 11/24/2015  . Chest pain 11/24/2015  . AKI (acute kidney injury) ([place]) 07/26/2014  . Infection of urinary tract 04/08/2014  . PID (acute pelvic inflammatory disease) 04/06/2014  . Diabetic ketoacidosis without coma associated with type 1 diabetes mellitus ([place]) 09/19/2013  . Infected cyst of Bartholin's  gland duct 09/19/2013  . Bartholin's gland abscess 09/19/2013  . Sepsis ([place]) 09/19/2013  . Abscess, gluteal, right 08/24/2013  . Health care maintenance 08/24/2013  . Hypophosphatemia 08/18/2013  . Hypomagnesemia 08/18/2013  . Leukocytosis, unspecified 08/17/2013  . Fever, unspecified 08/17/2013  . H/O medication noncompliance 08/12/2013  . Hyponatremia 08/04/2012  . Anemia 02/19/2012  . Hyperglycemia 11/06/2011  . Tachycardia 10/17/2011  . Diabetes mellitus type 1, uncontrolled, with complications ([place]) 93/71/6967    Past Surgical History:  Procedure Laterality Date  . INCISION AND DRAINAGE PERIRECTAL ABSCESS Right 08/18/2013   Procedure: IRRIGATION AND DEBRIDEMENT GLUTEAL ABSCESS;  Surgeon: Ralene Ok, MD;  Location: [facility];  Service: General;  Laterality: Right;  . INCISION AND DRAINAGE PERIRECTAL ABSCESS Right 09/19/2013   Procedure: IRRIGATION AND DEBRIDEMENT RIGHT GLUTEAL AND LABIAL ABSCESSES;  Surgeon: Ralene Ok, MD;  Location: [facility];  Service: General;  Laterality: Right;  . INCISION AND DRAINAGE PERIRECTAL ABSCESS Right 09/24/2013   Procedure: IRRIGATION AND DEBRIDEMENT PERIRECTAL ABSCESS;  Surgeon: Gwenyth Ober, MD;  Location: [facility];  Service: General;  Laterality: Right;  . NO PAST SURGERIES       OB History    Gravida  2   Para  0   Term      Preterm      AB  1   Living  0     SAB  1   TAB      Ectopic  Multiple      Live Births               Home Medications    Prior to Admission medications   Medication Sig Start Date End Date Taking? Authorizing Provider  HUMALOG KWIKPEN 100 UNIT/ML KiwkPen 0-12 units subcutaneously 3 times daily before meals as per sliding scale 06/19/17  Yes Newlin, Enobong, MD  Insulin Glargine (LANTUS) 100 UNIT/ML Solostar Pen Inject 48 Units into the skin daily at 10 pm. 08/12/17  Yes Charlott Rakes, MD  blood glucose meter kit and supplies KIT Dispense based on patient and insurance preference. Use up to  four times daily as directed. (FOR ICD-9 250.00, 250.01). 05/25/17   Isla Pence, MD  doxycycline (VIBRAMYCIN) 100 MG capsule Take 1 capsule (100 mg total) by mouth 2 (two) times daily. One po bid x 7 days Patient not taking: Reported on 05/25/2017 02/24/17   Street, [place], PA-C  HYDROcodone-acetaminophen (NORCO/VICODIN) 5-325 MG tablet Take 1 tablet by mouth every 4 (four) hours as needed. Patient not taking: Reported on 06/19/2017 05/25/17   Isla Pence, MD  ondansetron (ZOFRAN ODT) 4 MG disintegrating tablet Take 1 tablet (4 mg total) by mouth every 8 (eight) hours as needed for nausea or vomiting. 08/16/17   Lorin Glass, PA-C    Family History Family History  Problem Relation Age of Onset  . Asthma Mother   . Gout Father   . Diabetes Paternal Grandmother   . Anesthesia problems Neg Hx     Social History Social History   Tobacco Use  . Smoking status: Never Smoker  . Smokeless tobacco: Never Used  Substance Use Topics  . Alcohol use: No    Comment: occ  . Drug use: No     Allergies   Penicillins and Benadryl [diphenhydramine]   Review of Systems Review of Systems  Constitutional: Positive for appetite change and fatigue. Negative for chills and fever.  HENT: Negative for congestion.   Respiratory: Negative for cough and shortness of breath.   Gastrointestinal: Positive for diarrhea, nausea and vomiting. Negative for abdominal pain (Has resolved).  Endocrine: Negative for polydipsia and polyuria.  All other systems reviewed and are negative.    Physical Exam Updated Vital Signs BP (!) 128/96   Pulse 87   Temp 97.7 F (36.5 C) (Oral)   Resp 16   LMP 07/27/2017   SpO2 100%   Physical Exam  Constitutional: She is oriented to person, place, and time. She appears well-developed and well-nourished. No distress.  HENT:  Head: Normocephalic and atraumatic.  Mouth/Throat: Oropharynx is clear and moist.  Eyes: Conjunctivae are normal.  Neck: Normal  range of motion. Neck supple.  Cardiovascular: Regular rhythm, normal heart sounds and intact distal pulses. Tachycardia present.  No murmur heard. Pulmonary/Chest: Effort normal and breath sounds normal. No respiratory distress.  Abdominal: Soft. Bowel sounds are normal. She exhibits no distension and no mass. There is no tenderness. There is no guarding.  Musculoskeletal: She exhibits no edema.  Neurological: She is alert and oriented to person, place, and time.  Skin: Skin is warm and dry.  Psychiatric: She has a normal mood and affect.  Nursing note and vitals reviewed.    ED Treatments / Results  Labs (all labs ordered are listed, but only abnormal results are displayed) Labs Reviewed  COMPREHENSIVE METABOLIC PANEL - Abnormal; Notable for the following components:      Result Value   Glucose, Bld 124 (*)    Total  Protein 8.7 (*)    All other components within normal limits  URINALYSIS, ROUTINE W REFLEX MICROSCOPIC - Abnormal; Notable for the following components:   APPearance CLOUDY (*)    Glucose, UA >=500 (*)    Leukocytes, UA MODERATE (*)    Bacteria, UA FEW (*)    Squamous Epithelial / LPF TOO NUMEROUS TO COUNT (*)    All other components within normal limits  I-STAT CG4 LACTIC ACID, ED - Abnormal; Notable for the following components:   Lactic Acid, Venous 2.40 (*)    All other components within normal limits  CBG MONITORING, ED - Abnormal; Notable for the following components:   Glucose-Capillary 108 (*)    All other components within normal limits  I-STAT CG4 LACTIC ACID, ED - Abnormal; Notable for the following components:   Lactic Acid, Venous 2.24 (*)    All other components within normal limits  CBG MONITORING, ED - Abnormal; Notable for the following components:   Glucose-Capillary 232 (*)    All other components within normal limits  LIPASE, BLOOD  CBC  I-STAT BETA HCG BLOOD, ED ([facility], [facility], [facility] ONLY)  I-STAT CG4 LACTIC ACID, ED     EKG None  Radiology No results found.  Procedures Procedures (including critical care time)  Medications Ordered in ED Medications  sodium chloride 0.9 % bolus 1,000 mL (0 mLs Intravenous Stopped 08/16/17 2112)  sodium chloride 0.9 % bolus 1,000 mL (0 mLs Intravenous Stopped 08/16/17 1954)  sodium chloride 0.9 % bolus 1,000 mL (0 mLs Intravenous Stopped 08/16/17 2300)     Initial Impression / Assessment and Plan / ED Course  I have reviewed the triage vital signs and the nursing notes.  Pertinent labs & imaging results that were available during my care of the patient were reviewed by me and considered in my medical decision making (see chart for details).  Clinical Course as of Aug 16 2352  Fri Aug 16, 2017  1710 Blood sugar 108   [EH]  1734 500 glucose, no ketones, too many to count squams consistent with heavy contamination.   Urinalysis, Routine w reflex microscopic(!) [EH]  [id number] Patient reevaluated, she has  one 22-gauge in her left AC, is flowing very very slow, she has not had more than 100 cc of fluid from this IV.  RN will attempt ultrasound IV to obtain larger IV as patient needs at least 2 L of fluids.   [EH]  2149 Patient remains tachycardic, will order third liter of fluid and repeat lactic acid after third liter    [EH]    Clinical Course User Index [EH] Lorin Glass, PA-C   Patient is a 27 year old woman with a history of type 1 diabetes, noncompliant, who presents today for evaluation of tachycardia, nausea, vomiting, and diarrhea.  Initial glucose was not elevated.  Urine with no ketones, over 500 glucose, negative nitrite with moderate leukocytes and too many to count squamous epithelial cells, along with putting yeast, patient does not have urinary symptoms, so this is most likely representative of contamination.  Initial lactic acid was mildly elevated.  CBC without significant leukocytosis or anemia, lipase not elevated.  LFTs normal.  Patient was  treated with 2 L of IV fluid, after which her tachycardia had improved and her lactic acid decreased, however heart rate and lactic acid were both elevated.  Patient was given a third L of IV fluid which decreased her lactic acid to normal and resolved her tachycardia.  She did not  have any episodes of vomiting while in the department.  Compliance with diabetes treatment was discussed with the patient.  She was given a prescription for Zofran to help with oral rehydration.  Her abdomen was soft, nontender, nondistended and she reports no pain at this time.  CT scan not indicated.  Patient discharged home, PCP follow-up.  Final Clinical Impressions(s) / ED Diagnoses   Final diagnoses:  Nausea vomiting and diarrhea  Diabetes mellitus type 1, uncontrolled, with complications [facility])    ED Discharge Orders        Ordered    ondansetron (ZOFRAN ODT) 4 MG disintegrating tablet  Every 8 hours PRN     08/16/17 2343       Lorin Glass, PA-C 08/16/17 2354    Drenda Freeze, MD 08/17/17 [phone]

## 2017-08-16 NOTE — ED Notes (Signed)
Patient unable to give urine sample at this time

## 2017-08-16 NOTE — ED Notes (Signed)
Patient has a someone with her and does not want her results giving back with them in the room

## 2017-08-16 NOTE — ED Triage Notes (Signed)
Pt reports she ate at [facility] on Sunday and had v/d ever since.

## 2017-08-16 NOTE — Discharge Instructions (Addendum)
Please get a new glucometer.  You need to be checking your sugar multiple times a day.  You should be able to get a glucometer for lower cost at [employer].  According to the website you may be able to get a glucometer and 50 strips for under 20$.

## 2017-08-16 NOTE — ED Notes (Signed)
Lactic 2.40

## 2017-09-17 ENCOUNTER — Encounter (HOSPITAL_COMMUNITY): Payer: Self-pay | Admitting: Student

## 2017-09-17 ENCOUNTER — Ambulatory Visit: Payer: Self-pay | Admitting: Family Medicine

## 2017-09-17 ENCOUNTER — Other Ambulatory Visit: Payer: Self-pay

## 2017-09-17 ENCOUNTER — Inpatient Hospital Stay (HOSPITAL_COMMUNITY)
Admission: AD | Admit: 2017-09-17 | Discharge: 2017-09-17 | Disposition: A | Discharge status: Home / Self Care | Payer: Medicaid Other | Source: Ambulatory Visit | Attending: Obstetrics and Gynecology | Admitting: Obstetrics and Gynecology

## 2017-09-17 DIAGNOSIS — R739 Hyperglycemia, unspecified: Secondary | ICD-10-CM

## 2017-09-17 DIAGNOSIS — R109 Unspecified abdominal pain: Secondary | ICD-10-CM | POA: Insufficient documentation

## 2017-09-17 DIAGNOSIS — E1065 Type 1 diabetes mellitus with hyperglycemia: Secondary | ICD-10-CM | POA: Insufficient documentation

## 2017-09-17 DIAGNOSIS — R197 Diarrhea, unspecified: Secondary | ICD-10-CM | POA: Insufficient documentation

## 2017-09-17 DIAGNOSIS — A549 Gonococcal infection, unspecified: Secondary | ICD-10-CM | POA: Insufficient documentation

## 2017-09-17 DIAGNOSIS — E108 Type 1 diabetes mellitus with unspecified complications: Secondary | ICD-10-CM

## 2017-09-17 DIAGNOSIS — Z3202 Encounter for pregnancy test, result negative: Secondary | ICD-10-CM | POA: Insufficient documentation

## 2017-09-17 DIAGNOSIS — E1010 Type 1 diabetes mellitus with ketoacidosis without coma: Secondary | ICD-10-CM | POA: Insufficient documentation

## 2017-09-17 DIAGNOSIS — IMO0002 Reserved for concepts with insufficient information to code with codable children: Secondary | ICD-10-CM

## 2017-09-17 DIAGNOSIS — Z9119 Patient's noncompliance with other medical treatment and regimen: Secondary | ICD-10-CM | POA: Insufficient documentation

## 2017-09-17 DIAGNOSIS — Z794 Long term (current) use of insulin: Secondary | ICD-10-CM | POA: Insufficient documentation

## 2017-09-17 DIAGNOSIS — Z91199 Patient's noncompliance with other medical treatment and regimen due to unspecified reason: Secondary | ICD-10-CM

## 2017-09-17 LAB — COMPREHENSIVE METABOLIC PANEL
ALT: 14 U/L (ref 14–54)
AST: 17 U/L (ref 15–41)
Albumin: 3.7 g/dL (ref 3.5–5.0)
Alkaline Phosphatase: 61 U/L (ref 38–126)
Anion gap: 9 (ref 5–15)
BUN: 14 mg/dL (ref 6–20)
CHLORIDE: 97 mmol/L — AB (ref 101–111)
CO2: 23 mmol/L (ref 22–32)
Calcium: 9.4 mg/dL (ref 8.9–10.3)
Creatinine, Ser: 0.61 mg/dL (ref 0.44–1.00)
GFR calc Af Amer: >60 mL/min (ref >60)
GFR calc non Af Amer: >60 mL/min (ref >60)
GLUCOSE: 405 mg/dL — AB (ref 65–99)
Potassium: 4.5 mmol/L (ref 3.5–5.1)
SODIUM: 129 mmol/L — AB (ref 135–145)
Total Bilirubin: 0.5 mg/dL (ref 0.3–1.2)
Total Protein: 7.3 g/dL (ref 6.5–8.1)

## 2017-09-17 LAB — HCG, QUANTITATIVE, PREGNANCY: hCG, Beta Chain, Quant, S: <1 m[IU]/mL (ref <5)

## 2017-09-17 LAB — CBC
HCT: 35.5 % — ABNORMAL LOW (ref 36.0–46.0)
Hemoglobin: 11.7 g/dL — ABNORMAL LOW (ref 12.0–15.0)
MCH: 27.1 pg (ref 26.0–34.0)
MCHC: 33 g/dL (ref 30.0–36.0)
MCV: 82.4 fL (ref 78.0–100.0)
PLATELETS: 350 10*3/uL (ref 150–400)
RBC: 4.31 MIL/uL (ref 3.87–5.11)
RDW: 12.8 % (ref 11.5–15.5)
WBC: 6.4 10*3/uL (ref 4.0–10.5)

## 2017-09-17 LAB — URINALYSIS, ROUTINE W REFLEX MICROSCOPIC
BILIRUBIN URINE: NEGATIVE
Bacteria, UA: NONE SEEN
HGB URINE DIPSTICK: NEGATIVE
Ketones, ur: NEGATIVE mg/dL
Leukocytes, UA: NEGATIVE
NITRITE: NEGATIVE
Protein, ur: NEGATIVE mg/dL
Specific Gravity, Urine: 1.028 (ref 1.005–1.030)
pH: 6 (ref 5.0–8.0)

## 2017-09-17 LAB — GLUCOSE, CAPILLARY
GLUCOSE-CAPILLARY: 442 mg/dL — AB (ref 65–99)
Glucose-Capillary: 280 mg/dL — ABNORMAL HIGH (ref 65–99)
Glucose-Capillary: 328 mg/dL — ABNORMAL HIGH (ref 65–99)

## 2017-09-17 LAB — POCT PREGNANCY, URINE: Preg Test, Ur: NEGATIVE

## 2017-09-17 MED ORDER — INSULIN ASPART PROT & ASPART (70-30 MIX) 100 UNIT/ML ~~LOC~~ SUSP: 17.0000 [IU] | Freq: Once | SUBCUTANEOUS | Status: DC

## 2017-09-17 MED ORDER — INSULIN ASPART 100 UNIT/ML ~~LOC~~ SOLN: 17.0000 [IU] | Freq: Once | SUBCUTANEOUS | Status: DC

## 2017-09-17 NOTE — MAU Provider Note (Signed)
History     CSN: [id number]  Arrival date and time: 09/17/17 1120   First Provider Initiated Contact with Patient 09/17/17 1216      Chief Complaint  Patient presents with  . Abdominal Pain  . Diarrhea   HPI  Ashley Freeman is a 27 y.o. [id number] female patient who presents to MAU with abdominal pain, diarrhea, and possible pregnancy. LMP was 08/28/17. States she thinks she is pregnant because they've been "trying to get pregnant" but has not had a positive pregnancy test at home.  Patient is type 1 DM with history of multiple admissions for DKA per patient. She goes to [facility]. States she was last seen a few months ago and that she had an appt this morning but cancelled due to court. States she doesn't know her last A1C. Has not checked her BS today but when she checked it a few days ago it was "ok" and in the 200s. Has not taken any insulin this morning because she was "running late". Has not eaten today but has had soda this morning.  Reports generalized abdominal pain for the last week. Pain is intermittent. Currently rates pain 1/10. Endorses diarrhea. Reports 4-5 episodes of brown watery stool for the last week; small amounts at a time. Vomited twice the first day but no n/v since then.  Denies fever/chills, or vaginal bleeding.    Past Medical History:  Diagnosis Date  . Diabetes mellitus 2001   Diagnosed at age 72 ; Type I  . Diarrhea 05/30/2016  . DKA (diabetic ketoacidoses) ([place]) 08/19/2013  . DKA (diabetic ketoacidoses) ([place]) 05/30/2016  . Gonorrhea 08/2011   Treated in 09/2011    Past Surgical History:  Procedure Laterality Date  . INCISION AND DRAINAGE PERIRECTAL ABSCESS Right 08/18/2013   Procedure: IRRIGATION AND DEBRIDEMENT GLUTEAL ABSCESS;  Surgeon: Ralene Ok, MD;  Location: [facility];  Service: General;  Laterality: Right;  . INCISION AND DRAINAGE PERIRECTAL ABSCESS Right 09/19/2013   Procedure: IRRIGATION AND DEBRIDEMENT RIGHT  GLUTEAL AND LABIAL ABSCESSES;  Surgeon: Ralene Ok, MD;  Location: [facility];  Service: General;  Laterality: Right;  . INCISION AND DRAINAGE PERIRECTAL ABSCESS Right 09/24/2013   Procedure: IRRIGATION AND DEBRIDEMENT PERIRECTAL ABSCESS;  Surgeon: Gwenyth Ober, MD;  Location: [facility];  Service: General;  Laterality: Right;    Family History  Problem Relation Age of Onset  . Asthma Mother   . Gout Father   . Diabetes Paternal Grandmother   . Anesthesia problems Neg Hx     Social History   Tobacco Use  . Smoking status: Never Smoker  . Smokeless tobacco: Never Used  Substance Use Topics  . Alcohol use: No    Comment: occ  . Drug use: No    Allergies:  Allergies  Allergen Reactions  . Penicillins Hives and Rash    Has patient had a PCN reaction causing immediate rash, facial/tongue/throat swelling, SOB or lightheadedness with hypotension: Yes Has patient had a PCN reaction causing severe rash involving mucus membranes or skin necrosis: No Has patient had a PCN reaction that required hospitalization: Yes Has patient had a PCN reaction occurring within the last 10 years: No If all of the above answers are "NO", then may proceed with Cephalosporin use.  . Benadryl [Diphenhydramine] Itching    Facility-Administered Medications Prior to Admission  Medication Dose Route Frequency Provider Last Rate Last Dose  . Insulin Pen Needle (NOVOFINE) 200 each  20 packet Subcutaneous PRN Sherral Hammers,  Geraldo Docker, MD       Medications Prior to Admission  Medication Sig Dispense Refill Last Dose  . ibuprofen (ADVIL,MOTRIN) 800 MG tablet Take 800 mg by mouth every 8 (eight) hours as needed for cramping.   09/16/2017 at Unknown time  . blood glucose meter kit and supplies KIT Dispense based on patient and insurance preference. Use up to four times daily as directed. (FOR ICD-9 250.00, 250.01). 1 each 0 Not Taking  . doxycycline (VIBRAMYCIN) 100 MG capsule Take 1 capsule (100 mg total) by mouth 2 (two) times  daily. One po bid x 7 days (Patient not taking: Reported on 05/25/2017) 14 capsule 0 Completed Course at Unknown time  . HUMALOG KWIKPEN 100 UNIT/ML KiwkPen 0-12 units subcutaneously 3 times daily before meals as per sliding scale 15 mL 3 08/16/2017 at Unknown time  . HYDROcodone-acetaminophen (NORCO/VICODIN) 5-325 MG tablet Take 1 tablet by mouth every 4 (four) hours as needed. (Patient not taking: Reported on 06/19/2017) 10 tablet 0 Completed Course at Unknown time  . Insulin Glargine (LANTUS) 100 UNIT/ML Solostar Pen Inject 48 Units into the skin daily at 10 pm. 15 mL 11 08/15/2017 at Unknown time  . ondansetron (ZOFRAN ODT) 4 MG disintegrating tablet Take 1 tablet (4 mg total) by mouth every 8 (eight) hours as needed for nausea or vomiting. 10 tablet 0     Review of Systems  Constitutional: Negative.   Gastrointestinal: Positive for abdominal pain and diarrhea. Negative for anal bleeding, blood in stool, constipation, nausea and vomiting.  Genitourinary: Negative.    Physical Exam   Blood pressure 112/81, pulse 103, temperature 98.5 F (36.9 C), temperature source Oral, resp. rate 18, weight 127 lb 4 oz (57.7 kg), last menstrual period 08/28/2017, SpO2 100 %.  Physical Exam  Nursing note and vitals reviewed. Constitutional: She is oriented to person, place, and time. She appears well-developed and well-nourished. No distress.  HENT:  Head: Normocephalic and atraumatic.  Eyes: Conjunctivae are normal. Right eye exhibits no discharge. Left eye exhibits no discharge. No scleral icterus.  Neck: Normal range of motion.  Cardiovascular: Normal rate, regular rhythm and normal heart sounds.  No murmur heard. Respiratory: Effort normal and breath sounds normal. No respiratory distress. She has no wheezes.  GI: Soft. Bowel sounds are normal. She exhibits no distension. There is no tenderness. There is no rebound and no guarding.  Neurological: She is alert and oriented to person, place, and time.   Skin: Skin is warm and dry. She is not diaphoretic.  Psychiatric: She has a normal mood and affect. Her behavior is normal. Judgment and thought content normal.    MAU Course  Procedures Results for orders placed or performed during the hospital encounter of 09/17/17 (from the past 24 hour(s))  Urinalysis, Routine w reflex microscopic     Status: Abnormal   Collection Time: 09/17/17 11:37 AM  Result Value Ref Range   Color, Urine YELLOW YELLOW   APPearance CLOUDY (A) CLEAR   Specific Gravity, Urine 1.028 1.005 - 1.030   pH 6.0 5.0 - 8.0   Glucose, UA >=500 (A) NEGATIVE mg/dL   Hgb urine dipstick NEGATIVE NEGATIVE   Bilirubin Urine NEGATIVE NEGATIVE   Ketones, ur NEGATIVE NEGATIVE mg/dL   Protein, ur NEGATIVE NEGATIVE mg/dL   Nitrite NEGATIVE NEGATIVE   Leukocytes, UA NEGATIVE NEGATIVE   RBC / HPF 0-5 0 - 5 RBC/hpf   WBC, UA 11-20 0 - 5 WBC/hpf   Bacteria, UA NONE SEEN NONE SEEN  Squamous Epithelial / LPF 21-50 0 - 5  Pregnancy, urine POC     Status: None   Collection Time: 09/17/17 11:48 AM  Result Value Ref Range   Preg Test, Ur NEGATIVE NEGATIVE  Glucose, capillary     Status: Abnormal   Collection Time: 09/17/17 12:11 PM  Result Value Ref Range   Glucose-Capillary 442 (H) 65 - 99 mg/dL  CBC     Status: Abnormal   Collection Time: 09/17/17  1:05 PM  Result Value Ref Range   WBC 6.4 4.0 - 10.5 K/uL   RBC 4.31 3.87 - 5.11 MIL/uL   Hemoglobin 11.7 (L) 12.0 - 15.0 g/dL   HCT 35.5 (L) 36.0 - 46.0 %   MCV 82.4 78.0 - 100.0 fL   MCH 27.1 26.0 - 34.0 pg   MCHC 33.0 30.0 - 36.0 g/dL   RDW 12.8 11.5 - 15.5 %   Platelets 350 150 - 400 K/uL  Comprehensive metabolic panel     Status: Abnormal   Collection Time: 09/17/17  1:05 PM  Result Value Ref Range   Sodium 129 (L) 135 - 145 mmol/L   Potassium 4.5 3.5 - 5.1 mmol/L   Chloride 97 (L) 101 - 111 mmol/L   CO2 23 22 - 32 mmol/L   Glucose, Bld 405 (H) 65 - 99 mg/dL   BUN 14 6 - 20 mg/dL   Creatinine, Ser 0.61 0.44 - 1.00  mg/dL   Calcium 9.4 8.9 - 10.3 mg/dL   Total Protein 7.3 6.5 - 8.1 g/dL   Albumin 3.7 3.5 - 5.0 g/dL   AST 17 15 - 41 U/L   ALT 14 14 - 54 U/L   Alkaline Phosphatase 61 38 - 126 U/L   Total Bilirubin 0.5 0.3 - 1.2 mg/dL   GFR calc non Af Amer >60 >60 mL/min   GFR calc Af Amer >60 >60 mL/min   Anion gap 9 5 - 15  hCG, quantitative, pregnancy     Status: None   Collection Time: 09/17/17  1:05 PM  Result Value Ref Range   hCG, Beta Chain, Quant, S <1 <5 mIU/mL  Glucose, capillary     Status: Abnormal   Collection Time: 09/17/17  1:34 PM  Result Value Ref Range   Glucose-Capillary 328 (H) 65 - 99 mg/dL  Glucose, capillary     Status: Abnormal   Collection Time: 09/17/17  2:06 PM  Result Value Ref Range   Glucose-Capillary 280 (H) 65 - 99 mg/dL    MDM UPT negative  CBG 442. Novolog insulin 17 units ordered (pt given 17 units of regular insulin in error -- SZP filled out per RN). F/u CBG 328.   CBC, CMP, & HCG ordered. Anion gap 9.   CBG down to 280 prior to discharge. States BS at home range 200-500.   Assessment and Plan  A: 1. Hyperglycemia   2. Pregnancy examination or test, negative result   3. Noncompliance with diabetes treatment   4. Diabetes mellitus type 1, uncontrolled, with complications ([place])    P: Discharge home Stressed importance of BS control especially when considering future pregnancy If symptoms worsen pt to go to ED Recheck BS later this afternoon and treat accordingly Take insulin as prescribed Reschedule f/u with PCP  Jorje Guild 09/17/2017, 12:24 PM

## 2017-09-17 NOTE — Discharge Instructions (Signed)
How to Avoid Diabetes Mellitus Problems You can take action to prevent or slow down problems that are caused by diabetes (diabetes mellitus). Following your diabetes plan and taking care of yourself can reduce your risk of serious or life-threatening complications. Manage your diabetes  Follow instructions from your health care providers about managing your diabetes. Your diabetes may be managed by a team of health care providers who can teach you how to care for yourself and can answer questions that you have.  Educate yourself about your condition so you can make healthy choices about eating and physical activity.  Check your blood sugar (glucose) levels as often as directed. Your health care provider will help you decide how often to check your blood glucose level depending on your treatment goals and how well you are meeting them.  Ask your health care provider if you should take low-dose aspirin daily and what dose is recommended for you. Taking low-dose aspirin daily is recommended to help prevent cardiovascular disease. Do not use nicotine or tobacco Do not use any products that contain nicotine or tobacco, such as cigarettes and e-cigarettes. If you need help quitting, ask your health care provider. Nicotine raises your risk for diabetes problems. If you quit using nicotine:  You will lower your risk for heart attack, stroke, nerve disease, and kidney disease.  Your cholesterol and blood pressure may improve.  Your blood circulation will improve.  Keep your blood pressure under control To control your blood pressure:  Follow instructions from your health care provider about meal planning, exercise, and medicines.  Make sure your health care provider checks your blood pressure at every medical visit.  A blood pressure reading consists of two numbers. Generally, the goal is to keep your top number (systolic pressure) at or below 130, and your bottom number (diastolic pressure) at or  below 80. Your health care provider may recommend a lower target blood pressure. Your individualized target blood pressure is determined based on:  Your age.  Your medicines.  How long you have had diabetes.  Any other medical conditions you have.  Keep your cholesterol under control To control your cholesterol:  Follow instructions from your health care provider about meal planning, exercise, and medicines.  Have your cholesterol checked at least once a year.  You may be prescribed medicine to lower cholesterol (statin). If you are not taking a statin, ask your health care provider if you should be.  Controlling your cholesterol may:  Help prevent heart disease and stroke. These are the most common health problems for people with diabetes.  Improve your blood flow.  Schedule and keep yearly physical exams and eye exams Your health care provider will tell you how often you need medical visits depending on your diabetes management plan. Keep all follow-up visits as directed. This is important so possible problems can be identified early and complications can be avoided or treated.  Every visit with your health care provider should include measuring your: ? Weight. ? Blood pressure. ? Blood glucose control.  Your A1c (hemoglobin A1c) level should be checked: ? At least 2 times a year, if you are meeting your treatment goals. ? 4 times a year, if you are not meeting treatment goals or if your treatment goals have changed.  Your blood lipids (lipid profile) should be checked yearly. You should also be checked yearly for protein in your urine (urine microalbumin).  If you have type 1 diabetes, get an eye exam 3-5 years after you  are diagnosed, and then once a year after your first exam.  If you have type 2 diabetes, get an eye exam as soon as you are diagnosed, and then once a year after your first exam.  Keep your vaccines current It is recommended that you receive:  A flu  (influenza) vaccine every year.  A pneumonia (pneumococcal) vaccine and a hepatitis B vaccine. If you are age 30 or older, you may get the pneumonia vaccine as a series of two separate shots.  Ask your health care provider which other vaccines may be recommended. Take care of your feet Diabetes may cause you to have poor blood circulation to your legs and feet. Because of this, taking care of your feet is very important. Diabetes can cause:  The skin on the feet to get thinner, break more easily, and heal more slowly.  Nerve damage in your legs and feet, which results in decreased feeling. You may not notice minor injuries that could lead to serious problems.  To avoid foot problems:  Check your skin and feet every day for cuts, bruises, redness, blisters, or sores.  Schedule a foot exam with your health care provider once every year. This exam includes: ? Inspecting of the structure and skin of your feet. ? Checking the pulses and sensation in your feet.  Make sure that your health care provider performs a visual foot exam at every medical visit.  Take care of your teeth People with poorly controlled diabetes are more likely to have gum (periodontal) disease. Diabetes can make periodontal diseases harder to control. If not treated, periodontal diseases can lead to tooth loss. To prevent this:  Brush your teeth twice a day.  Floss at least once a day.  Visit your dentist 2 times a year.  Drink responsibly Limit alcohol intake to no more than 1 drink a day for nonpregnant women and 2 drinks a day for men. One drink equals 12 oz of beer, 5 oz of wine, or 1 oz of hard liquor. It is important to eat food when you drink alcohol to avoid low blood glucose (hypoglycemia). Avoid alcohol if you:  Have a history of alcohol abuse or dependence.  Are pregnant.  Have liver disease, pancreatitis, advanced neuropathy, or severe hypertriglyceridemia.  Lessen stress Living with diabetes can  be stressful. When you are experiencing stress, your blood glucose may be affected in two ways:  Stress hormones may cause your blood glucose to rise.  You may be distracted from taking good care of yourself.  Be aware of your stress level and make changes to help you manage challenging situations. To lower your stress levels:  Consider joining a support group.  Do planned relaxation or meditation.  Do a hobby that you enjoy.  Maintain healthy relationships.  Exercise regularly.  Work with your health care provider or a mental health professional.  Summary  You can take action to prevent or slow down problems that are caused by diabetes (diabetes mellitus). Following your diabetes plan and taking care of yourself can reduce your risk of serious or life-threatening complications.  Follow instructions from your health care providers about managing your diabetes. Your diabetes may be managed by a team of health care providers who can teach you how to care for yourself and can answer questions that you have.  Your health care provider will tell you how often you need medical visits depending on your diabetes management plan. Keep all follow-up visits as directed. This is important  so possible problems can be identified early and complications can be avoided or treated. This information is not intended to replace advice given to you by your health care provider. Make sure you discuss any questions you have with your health care provider. Document Released: 01/02/2011 Document Revised: 01/14/2016 Document Reviewed: 01/14/2016 Elsevier Interactive Patient Education  [employer].

## 2017-09-17 NOTE — MAU Note (Addendum)
Cramping from below ribs to pelvis.  Off and on for a wk.  Also been having diarrhea, started this same abt of time.  Watery~ 3/a day. Last period  Was lighter than usual, stopped for a day then had some spotting.  Neg HPT on Sat.  With last preg, her home test was neg also.

## 2017-09-17 NOTE — Progress Notes (Signed)
Regular insulin 17u given subq left arm.

## 2017-09-17 NOTE — MAU Note (Signed)
Pt here for abdominal pain x 1 week. Concerned about pregnancy. LMP 08/29/17.

## 2017-10-02 ENCOUNTER — Emergency Department (HOSPITAL_COMMUNITY): Payer: Self-pay

## 2017-10-02 ENCOUNTER — Emergency Department (HOSPITAL_COMMUNITY)
Admission: EM | Admit: 2017-10-02 | Discharge: 2017-10-02 | Disposition: A | Discharge status: Home / Self Care | Payer: Self-pay | Attending: Emergency Medicine | Admitting: Emergency Medicine

## 2017-10-02 ENCOUNTER — Encounter (HOSPITAL_COMMUNITY): Payer: Self-pay | Admitting: Emergency Medicine

## 2017-10-02 DIAGNOSIS — Z5321 Procedure and treatment not carried out due to patient leaving prior to being seen by health care provider: Secondary | ICD-10-CM | POA: Insufficient documentation

## 2017-10-02 DIAGNOSIS — R079 Chest pain, unspecified: Secondary | ICD-10-CM | POA: Insufficient documentation

## 2017-10-02 NOTE — ED Triage Notes (Signed)
Patient here from home with complaints of chest pain and SOB that started 3 days ago. Diabetic.

## 2017-10-02 NOTE — ED Notes (Signed)
Called for triage x1.

## 2017-10-03 NOTE — ED Notes (Signed)
Follow up call made  States is returning to ed after work   10/03/17 1455  s Gola Bribiesca rn

## 2017-10-21 ENCOUNTER — Ambulatory Visit: Payer: Self-pay | Admitting: Family Medicine

## 2017-10-27 ENCOUNTER — Encounter (HOSPITAL_COMMUNITY): Payer: Self-pay

## 2017-10-27 ENCOUNTER — Emergency Department (HOSPITAL_COMMUNITY): Payer: Self-pay

## 2017-10-27 ENCOUNTER — Emergency Department (HOSPITAL_COMMUNITY)
Admission: EM | Admit: 2017-10-27 | Discharge: 2017-10-27 | Disposition: A | Discharge status: Home / Self Care | Payer: Self-pay | Attending: Emergency Medicine | Admitting: Emergency Medicine

## 2017-10-27 DIAGNOSIS — Z79899 Other long term (current) drug therapy: Secondary | ICD-10-CM | POA: Insufficient documentation

## 2017-10-27 DIAGNOSIS — Z794 Long term (current) use of insulin: Secondary | ICD-10-CM | POA: Insufficient documentation

## 2017-10-27 DIAGNOSIS — J069 Acute upper respiratory infection, unspecified: Secondary | ICD-10-CM | POA: Insufficient documentation

## 2017-10-27 DIAGNOSIS — E109 Type 1 diabetes mellitus without complications: Secondary | ICD-10-CM | POA: Insufficient documentation

## 2017-10-27 LAB — URINALYSIS, ROUTINE W REFLEX MICROSCOPIC
BILIRUBIN URINE: NEGATIVE
Ketones, ur: NEGATIVE mg/dL
Nitrite: NEGATIVE
Protein, ur: NEGATIVE mg/dL
SPECIFIC GRAVITY, URINE: 1.012 (ref 1.005–1.030)
pH: 5 (ref 5.0–8.0)

## 2017-10-27 LAB — CBC
HCT: 39 % (ref 36.0–46.0)
Hemoglobin: 12.5 g/dL (ref 12.0–15.0)
MCH: 26.4 pg (ref 26.0–34.0)
MCHC: 32.1 g/dL (ref 30.0–36.0)
MCV: 82.5 fL (ref 78.0–100.0)
Platelets: 305 10*3/uL (ref 150–400)
RBC: 4.73 MIL/uL (ref 3.87–5.11)
RDW: 12.6 % (ref 11.5–15.5)
WBC: 6.8 10*3/uL (ref 4.0–10.5)

## 2017-10-27 LAB — BASIC METABOLIC PANEL
Anion gap: 11 (ref 5–15)
BUN: 14 mg/dL (ref 6–20)
CHLORIDE: 101 mmol/L (ref 98–111)
CO2: 21 mmol/L — ABNORMAL LOW (ref 22–32)
Calcium: 9.1 mg/dL (ref 8.9–10.3)
Creatinine, Ser: 0.77 mg/dL (ref 0.44–1.00)
GFR calc Af Amer: >60 mL/min (ref >60)
GFR calc non Af Amer: >60 mL/min (ref >60)
Glucose, Bld: 307 mg/dL — ABNORMAL HIGH (ref 70–99)
POTASSIUM: 3.9 mmol/L (ref 3.5–5.1)
SODIUM: 133 mmol/L — AB (ref 135–145)

## 2017-10-27 LAB — I-STAT BETA HCG BLOOD, ED (MC, WL, AP ONLY): I-stat hCG, quantitative: <5 m[IU]/mL

## 2017-10-27 LAB — CBG MONITORING, ED: Glucose-Capillary: 276 mg/dL — ABNORMAL HIGH (ref 70–99)

## 2017-10-27 MED ORDER — ALBUTEROL SULFATE HFA 108 (90 BASE) MCG/ACT IN AERS
2.0000 | INHALATION_SPRAY | Freq: Once | RESPIRATORY_TRACT | Status: AC
  Administered 2017-10-27: 2 via RESPIRATORY_TRACT
  Filled 2017-10-27: qty 6.7

## 2017-10-27 MED ORDER — DOXYCYCLINE HYCLATE 100 MG PO CAPS: 100.0000 mg | ORAL_CAPSULE | Freq: Two times a day (BID) | ORAL | 0 refills | Status: AC

## 2017-10-27 MED ORDER — SODIUM CHLORIDE 0.9 % IV BOLUS
1000.0000 mL | Freq: Once | INTRAVENOUS | Status: AC
  Administered 2017-10-27: 1000 mL via INTRAVENOUS

## 2017-10-27 MED ORDER — ONDANSETRON 4 MG PO TBDP: 4.0000 mg | ORAL_TABLET | Freq: Three times a day (TID) | ORAL | 0 refills | Status: DC | PRN

## 2017-10-27 NOTE — ED Notes (Signed)
ED Provider at bedside. 

## 2017-10-27 NOTE — ED Provider Notes (Signed)
[facility] EMERGENCY DEPARTMENT Provider Note   CSN: [id number] Arrival date & time: 10/27/17  9628     History   Chief Complaint Chief Complaint  Patient presents with  . Hyperglycemia    HPI Ashley Freeman is a 27 y.o. female.  HPI 27 year old female with history of type 1 diabetes here with cough, general fatigue, and headache.  The patient states that for the last week, she is had a mild, dry cough and runny nose, general fatigue.  She states her blood sugar has been progressively increasing and has been reading as high.  She has not had any significant shortness of breath, only when she is coughing.  She said no sputum production.  No fevers.  She said some mild nausea, but no vomiting.  She states she has not had abdominal cramping like she normally does when she is in DKA.  She is been taking her insulin as prescribed.  Denies any vaginal bleeding or discharge.  She had a mild headache which is not unusual for her.  No neck stiffness.  No photophobia.  Past Medical History:  Diagnosis Date  . Diabetes mellitus 2001   Diagnosed at age 69 ; Type I  . Diarrhea 05/30/2016  . DKA (diabetic ketoacidoses) ([place]) 08/19/2013  . DKA (diabetic ketoacidoses) ([place]) 05/30/2016  . Gonorrhea 08/2011   Treated in 09/2011    Patient Active Problem List   Diagnosis Date Noted  . DKA (diabetic ketoacidosis) ([place]) 12/29/2016  . Inguinal abscess   . History of trichomoniasis 05/31/2016  . Diarrhea 05/31/2016  . DKA (diabetic ketoacidoses) ([place]) 05/30/2016  . Hyperlipidemia 03/28/2016  . Diabetic neuropathy ([place]) 01/16/2016  . DKA, type 1 ([place]) 11/24/2015  . BV (bacterial vaginosis) 11/24/2015  . Chest pain 11/24/2015  . AKI (acute kidney injury) ([place]) 07/26/2014  . Infection of urinary tract 04/08/2014  . PID (acute pelvic inflammatory disease) 04/06/2014  . Diabetic ketoacidosis without coma associated with type 1 diabetes mellitus ([place]) 09/19/2013  . Infected  cyst of Bartholin's gland duct 09/19/2013  . Bartholin's gland abscess 09/19/2013  . Sepsis ([place]) 09/19/2013  . Abscess, gluteal, right 08/24/2013  . Health care maintenance 08/24/2013  . Hypophosphatemia 08/18/2013  . Hypomagnesemia 08/18/2013  . Leukocytosis, unspecified 08/17/2013  . Fever, unspecified 08/17/2013  . H/O medication noncompliance 08/12/2013  . Hyponatremia 08/04/2012  . Anemia 02/19/2012  . Hyperglycemia 11/06/2011  . Tachycardia 10/17/2011  . Diabetes mellitus type 1, uncontrolled, with complications ([place]) 36/62/9476    Past Surgical History:  Procedure Laterality Date  . INCISION AND DRAINAGE PERIRECTAL ABSCESS Right 08/18/2013   Procedure: IRRIGATION AND DEBRIDEMENT GLUTEAL ABSCESS;  Surgeon: Ralene Ok, MD;  Location: [facility];  Service: General;  Laterality: Right;  . INCISION AND DRAINAGE PERIRECTAL ABSCESS Right 09/19/2013   Procedure: IRRIGATION AND DEBRIDEMENT RIGHT GLUTEAL AND LABIAL ABSCESSES;  Surgeon: Ralene Ok, MD;  Location: [facility];  Service: General;  Laterality: Right;  . INCISION AND DRAINAGE PERIRECTAL ABSCESS Right 09/24/2013   Procedure: IRRIGATION AND DEBRIDEMENT PERIRECTAL ABSCESS;  Surgeon: Gwenyth Ober, MD;  Location: [facility];  Service: General;  Laterality: Right;     OB History    Gravida  1   Para  0   Term      Preterm      AB  1   Living  0     SAB  1   TAB      Ectopic      Multiple  Live Births               Home Medications    Prior to Admission medications   Medication Sig Start Date End Date Taking? Authorizing Provider  blood glucose meter kit and supplies KIT Dispense based on patient and insurance preference. Use up to four times daily as directed. (FOR ICD-9 250.00, 250.01). 05/25/17   Isla Pence, MD  doxycycline (VIBRAMYCIN) 100 MG capsule Take 1 capsule (100 mg total) by mouth 2 (two) times daily for 7 days. 10/27/17 11/03/17  Duffy Bruce, MD  HUMALOG KWIKPEN 100 UNIT/ML KiwkPen 0-12  units subcutaneously 3 times daily before meals as per sliding scale 06/19/17   Charlott Rakes, MD  ibuprofen (ADVIL,MOTRIN) 800 MG tablet Take 800 mg by mouth every 8 (eight) hours as needed for cramping.    [provider]  Insulin Glargine (LANTUS) 100 UNIT/ML Solostar Pen Inject 48 Units into the skin daily at 10 pm. 08/12/17   Charlott Rakes, MD  ondansetron (ZOFRAN ODT) 4 MG disintegrating tablet Take 1 tablet (4 mg total) by mouth every 8 (eight) hours as needed for nausea or vomiting. 10/27/17   Duffy Bruce, MD    Family History Family History  Problem Relation Age of Onset  . Asthma Mother   . Gout Father   . Diabetes Paternal Grandmother   . Anesthesia problems Neg Hx     Social History Social History   Tobacco Use  . Smoking status: Never Smoker  . Smokeless tobacco: Never Used  Substance Use Topics  . Alcohol use: No    Comment: occ  . Drug use: No     Allergies   Penicillins and Benadryl [diphenhydramine]   Review of Systems Review of Systems  Constitutional: Positive for fatigue. Negative for chills and fever.  HENT: Negative for congestion and rhinorrhea.   Eyes: Negative for visual disturbance.  Respiratory: Positive for cough and wheezing. Negative for shortness of breath.   Cardiovascular: Negative for chest pain and leg swelling.  Gastrointestinal: Negative for abdominal pain, diarrhea, nausea and vomiting.  Genitourinary: Negative for dysuria and flank pain.  Musculoskeletal: Negative for neck pain and neck stiffness.  Skin: Negative for rash and wound.  Allergic/Immunologic: Negative for immunocompromised state.  Neurological: Positive for weakness. Negative for syncope and headaches.  All other systems reviewed and are negative.    Physical Exam Updated Vital Signs BP (!) 122/91 (BP Location: Right Arm)   Pulse 99   Temp 98.3 F (36.8 C) (Oral)   Resp 16   LMP 10/20/2017 (Approximate)   SpO2 99%   Physical Exam    Constitutional: She is oriented to person, place, and time. She appears well-developed and well-nourished. No distress.  HENT:  Head: Normocephalic and atraumatic.  Moderate nasal congestion and posterior pharyngeal erythema.  No tonsillar exudate.  Eyes: Conjunctivae are normal.  Neck: Neck supple.  Cardiovascular: Normal rate, regular rhythm and normal heart sounds. Exam reveals no friction rub.  No murmur heard. Pulmonary/Chest: Effort normal. No respiratory distress. She has wheezes. She has no rales.  Abdominal: She exhibits no distension.  Musculoskeletal: She exhibits no edema.  Neurological: She is alert and oriented to person, place, and time. She exhibits normal muscle tone.  Skin: Skin is warm. Capillary refill takes less than 2 seconds.  Psychiatric: She has a normal mood and affect.  Nursing note and vitals reviewed.    ED Treatments / Results  Labs (all labs ordered are listed, but only abnormal results  are displayed) Labs Reviewed  BASIC METABOLIC PANEL - Abnormal; Notable for the following components:      Result Value   Sodium 133 (*)    CO2 21 (*)    Glucose, Bld 307 (*)    All other components within normal limits  URINALYSIS, ROUTINE W REFLEX MICROSCOPIC - Abnormal; Notable for the following components:   APPearance HAZY (*)    Glucose, UA >=500 (*)    Hgb urine dipstick SMALL (*)    Leukocytes, UA MODERATE (*)    Bacteria, UA RARE (*)    All other components within normal limits  CBG MONITORING, ED - Abnormal; Notable for the following components:   Glucose-Capillary 276 (*)    All other components within normal limits  URINE CULTURE  CBC  I-STAT BETA HCG BLOOD, ED ([facility], [facility], [facility] ONLY)    EKG EKG Interpretation  Date/Time:  'Sunday October 27 2017 14:59:08 EDT Ventricular Rate:  99 PR Interval:  116 QRS Duration: 72 QT Interval:  354 QTC Calculation: 454 R Axis:   69 Text Interpretation:  Normal sinus rhythm Normal ECG No significant change since  last tracing Confirmed by ,  ([id number]) on 10/27/2017 3:18:39 PM   Radiology Dg Chest 2 View  Result Date: 10/27/2017 CLINICAL DATA:  Chest pain and shortness of breath EXAM: CHEST - 2 VIEW COMPARISON:  Chest radiograph 10/02/2017 FINDINGS: Stable cardiac and mediastinal contours. No consolidative pulmonary opacities. No pleural effusion or pneumothorax. Regional skeleton is unremarkable. IMPRESSION: No acute cardiopulmonary process. Electronically Signed   By: Drew  Davis M.D.   On: 10/27/2017 12:11    Procedures Procedures (including critical care time)  Medications Ordered in ED Medications  sodium chloride 0.9 % bolus 1,000 mL (0 mLs Intravenous Stopped 10/27/17 1230)  sodium chloride 0.9 % bolus 1,000 mL (0 mLs Intravenous Stopped 10/27/17 1230)  albuterol (PROVENTIL HFA;VENTOLIN HFA) 108 (90 Base) MCG/ACT inhaler 2 puff (2 puffs Inhalation Given 10/27/17 1129)     Initial Impression / Assessment and Plan / ED Course  I have reviewed the triage vital signs and the nursing notes.  Pertinent labs & imaging results that were available during my care of the patient were reviewed by me and considered in my medical decision making (see chart for details).     27'  year old female here with cough and shortness of breath as well as hyperglycemia.  Lab work shows no evidence of DKA with a normal anion gap and normal bicarb.  I suspect patient likely has viral URI and does have known sick contacts.  Chest x-ray without pneumonia.  Urinalysis does show some pyuria, but this appears to be chronic and she strongly denies any urinary symptoms.  No vaginal bleeding or other symptoms.  Abdomen is soft.  Will treat for likely viral URI with bronchitis.  She does have a history of requiring breathing treatments in the past and responded well to albuterol here.  Given her sputum production and comorbidities, will cover with doxycycline, start albuterol, and refer for outpatient follow-up.  Good  return precautions given.  She was given IV fluids with significant improvement in her symptoms and she is been amatory without difficulty.  She will monitor her blood sugars closely.  Final Clinical Impressions(s) / ED Diagnoses   Final diagnoses:  Upper respiratory tract infection, unspecified type    ED Discharge Orders        Ordered    doxycycline (VIBRAMYCIN) 100 MG capsule  2 times daily  10/27/17 1432    ondansetron (ZOFRAN ODT) 4 MG disintegrating tablet  Every 8 hours PRN     10/27/17 1432       Duffy Bruce, MD 10/27/17 1526

## 2017-10-27 NOTE — ED Notes (Signed)
Patient transported to X-ray 

## 2017-10-27 NOTE — ED Triage Notes (Signed)
Pt presents for evaluation of hyperglycemia x 1 week. States BG meter reads HI, has been taking insulin. Pt reports some ongoing sob, seen for the same in early June with negative work up.

## 2017-10-27 NOTE — ED Notes (Signed)
Patient is resting with family at bedside

## 2017-10-27 NOTE — ED Notes (Signed)
Patient verbalizes understanding of discharge instructions. Opportunity for questioning and answers were provided. Armband removed by staff, pt discharged from ED ambulatory with female companion.

## 2017-10-28 LAB — URINE CULTURE

## 2017-10-29 ENCOUNTER — Telehealth: Payer: Self-pay | Admitting: Emergency Medicine

## 2017-10-29 NOTE — Telephone Encounter (Signed)
Post ED Visit - Positive Culture Follow-up  Culture report reviewed by antimicrobial stewardship pharmacist:  []  [name], Pharm.D. []  [name], Pharm.D., BCPS AQ-ID []  [name], Pharm.D., BCPS []  [name], Pharm.D., BCPS []  [name], Pharm.D., BCPS, AAHIVP []  [name], Pharm.D., BCPS, AAHIVP [x]  [name], PharmD, BCPS []  [name], PharmD, BCPS []  [name], PharmD, BCPS []  [name], PharmD  Positive urine culture Treated with doxycycline, organism sensitive to the same and no further patient follow-up is required at this time.  Ashley Freeman 10/29/2017, 9:55 AM

## 2017-11-04 ENCOUNTER — Ambulatory Visit: Payer: Self-pay | Admitting: Family Medicine

## 2017-12-10 ENCOUNTER — Other Ambulatory Visit: Payer: Self-pay

## 2017-12-10 ENCOUNTER — Emergency Department (HOSPITAL_COMMUNITY)
Admission: EM | Admit: 2017-12-10 | Discharge: 2017-12-10 | Disposition: A | Discharge status: Home / Self Care | Payer: Medicaid Other | Attending: Emergency Medicine | Admitting: Emergency Medicine

## 2017-12-10 ENCOUNTER — Encounter (HOSPITAL_COMMUNITY): Payer: Self-pay | Admitting: *Deleted

## 2017-12-10 DIAGNOSIS — E1040 Type 1 diabetes mellitus with diabetic neuropathy, unspecified: Secondary | ICD-10-CM | POA: Insufficient documentation

## 2017-12-10 DIAGNOSIS — E1065 Type 1 diabetes mellitus with hyperglycemia: Secondary | ICD-10-CM | POA: Insufficient documentation

## 2017-12-10 DIAGNOSIS — R739 Hyperglycemia, unspecified: Secondary | ICD-10-CM

## 2017-12-10 DIAGNOSIS — N390 Urinary tract infection, site not specified: Secondary | ICD-10-CM | POA: Insufficient documentation

## 2017-12-10 LAB — URINALYSIS, ROUTINE W REFLEX MICROSCOPIC
Bilirubin Urine: NEGATIVE
Glucose, UA: >=500 mg/dL — AB
Ketones, ur: 5 mg/dL — AB
Nitrite: POSITIVE — AB
PH: 6 (ref 5.0–8.0)
Protein, ur: NEGATIVE mg/dL
SPECIFIC GRAVITY, URINE: 1.026 (ref 1.005–1.030)
WBC, UA: >50 WBC/hpf — ABNORMAL HIGH (ref 0–5)

## 2017-12-10 LAB — CBG MONITORING, ED
GLUCOSE-CAPILLARY: 362 mg/dL — AB (ref 70–99)
GLUCOSE-CAPILLARY: 365 mg/dL — AB (ref 70–99)
GLUCOSE-CAPILLARY: 301 mg/dL — AB (ref 70–99)

## 2017-12-10 LAB — BASIC METABOLIC PANEL
Anion gap: 11 (ref 5–15)
BUN: 15 mg/dL (ref 6–20)
CO2: 23 mmol/L (ref 22–32)
CREATININE: 0.98 mg/dL (ref 0.44–1.00)
Calcium: 9.3 mg/dL (ref 8.9–10.3)
Chloride: 98 mmol/L (ref 98–111)
GFR calc Af Amer: >60 mL/min (ref >60)
GFR calc non Af Amer: >60 mL/min (ref >60)
GLUCOSE: 384 mg/dL — AB (ref 70–99)
Potassium: 3.9 mmol/L (ref 3.5–5.1)
SODIUM: 132 mmol/L — AB (ref 135–145)

## 2017-12-10 LAB — CBC
HCT: 39.4 % (ref 36.0–46.0)
Hemoglobin: 12.9 g/dL (ref 12.0–15.0)
MCH: 26.3 pg (ref 26.0–34.0)
MCHC: 32.7 g/dL (ref 30.0–36.0)
MCV: 80.2 fL (ref 78.0–100.0)
PLATELETS: 330 10*3/uL (ref 150–400)
RBC: 4.91 MIL/uL (ref 3.87–5.11)
RDW: 12.9 % (ref 11.5–15.5)
WBC: 7.2 10*3/uL (ref 4.0–10.5)

## 2017-12-10 LAB — I-STAT BETA HCG BLOOD, ED (MC, WL, AP ONLY)

## 2017-12-10 MED ORDER — SULFAMETHOXAZOLE-TRIMETHOPRIM 800-160 MG PO TABS
1.0000 | ORAL_TABLET | Freq: Once | ORAL | Status: AC
  Administered 2017-12-10: 1 via ORAL
  Filled 2017-12-10: qty 1

## 2017-12-10 MED ORDER — INSULIN ASPART 100 UNIT/ML ~~LOC~~ SOLN
10.0000 [IU] | Freq: Once | SUBCUTANEOUS | Status: AC
  Administered 2017-12-10: 10 [IU] via SUBCUTANEOUS
  Filled 2017-12-10: qty 1

## 2017-12-10 MED ORDER — SULFAMETHOXAZOLE-TRIMETHOPRIM 800-160 MG PO TABS: 1.0000 | ORAL_TABLET | Freq: Two times a day (BID) | ORAL | 0 refills | Status: AC

## 2017-12-10 MED ORDER — SODIUM CHLORIDE 0.9 % IV BOLUS
1000.0000 mL | Freq: Once | INTRAVENOUS | Status: AC
  Administered 2017-12-10: 1000 mL via INTRAVENOUS

## 2017-12-10 NOTE — ED Notes (Signed)
Unsuccessful IV attempt x2 by this RN, IV team consulted.

## 2017-12-10 NOTE — ED Provider Notes (Signed)
[facility] EMERGENCY DEPARTMENT Provider Note   CSN: [id number] Arrival date & time: 12/10/17  0725     History   Chief Complaint Chief Complaint  Patient presents with  . Hyperglycemia    HPI Ashley Freeman is a 27 y.o. female.  HPI  The patient is a 27 year old type I diabetic, currently takes insulin both long-acting and short-acting, states there is been a death in the family and that she has not been taking any of her medications for a day or so however yesterday because her blood sugar readings kept showing that it was too high to read or in the 400 range she did take her insulin yesterday morning and again took her short acting insulin last night.  That being said she wakes up today with a blood sugar over 400 and was concerned because of this.  She denies any other symptoms including abdominal pain coughing fever swelling dysuria diarrhea rectal bleeding rashes or any other complaints.  She states that she is usually compliant with her medications, that being said it does appear that she has had multiple emergency department evaluations and admissions to the hospital in the past for diabetic ketoacidosis.   Past Medical History:  Diagnosis Date  . Diabetes mellitus 2001   Diagnosed at age 27 ; Type I  . Diarrhea 05/30/2016  . DKA (diabetic ketoacidoses) ([place]) 08/19/2013  . DKA (diabetic ketoacidoses) ([place]) 05/30/2016  . Gonorrhea 08/2011   Treated in 09/2011    Patient Active Problem List   Diagnosis Date Noted  . DKA (diabetic ketoacidosis) ([place]) 12/29/2016  . Inguinal abscess   . History of trichomoniasis 05/31/2016  . Diarrhea 05/31/2016  . DKA (diabetic ketoacidoses) ([place]) 05/30/2016  . Hyperlipidemia 03/28/2016  . Diabetic neuropathy ([place]) 01/16/2016  . DKA, type 1 ([place]) 11/24/2015  . BV (bacterial vaginosis) 11/24/2015  . Chest pain 11/24/2015  . AKI (acute kidney injury) ([place]) 07/26/2014  . Infection of urinary tract 04/08/2014  .  PID (acute pelvic inflammatory disease) 04/06/2014  . Diabetic ketoacidosis without coma associated with type 1 diabetes mellitus ([place]) 09/19/2013  . Infected cyst of Bartholin's gland duct 09/19/2013  . Bartholin's gland abscess 09/19/2013  . Sepsis ([place]) 09/19/2013  . Abscess, gluteal, right 08/24/2013  . Health care maintenance 08/24/2013  . Hypophosphatemia 08/18/2013  . Hypomagnesemia 08/18/2013  . Leukocytosis, unspecified 08/17/2013  . Fever, unspecified 08/17/2013  . H/O medication noncompliance 08/12/2013  . Hyponatremia 08/04/2012  . Anemia 02/19/2012  . Hyperglycemia 11/06/2011  . Tachycardia 10/17/2011  . Diabetes mellitus type 1, uncontrolled, with complications ([place]) 97/05/6376    Past Surgical History:  Procedure Laterality Date  . INCISION AND DRAINAGE PERIRECTAL ABSCESS Right 08/18/2013   Procedure: IRRIGATION AND DEBRIDEMENT GLUTEAL ABSCESS;  Surgeon: Ralene Ok, MD;  Location: [facility];  Service: General;  Laterality: Right;  . INCISION AND DRAINAGE PERIRECTAL ABSCESS Right 09/19/2013   Procedure: IRRIGATION AND DEBRIDEMENT RIGHT GLUTEAL AND LABIAL ABSCESSES;  Surgeon: Ralene Ok, MD;  Location: [facility];  Service: General;  Laterality: Right;  . INCISION AND DRAINAGE PERIRECTAL ABSCESS Right 09/24/2013   Procedure: IRRIGATION AND DEBRIDEMENT PERIRECTAL ABSCESS;  Surgeon: Gwenyth Ober, MD;  Location: [facility];  Service: General;  Laterality: Right;     OB History    Gravida  1   Para  0   Term      Preterm      AB  1   Living  0     SAB  1   TAB      Ectopic      Multiple      Live Births               Home Medications    Prior to Admission medications   Medication Sig Start Date End Date Taking? Authorizing Provider  HUMALOG KWIKPEN 100 UNIT/ML KiwkPen 0-12 units subcutaneously 3 times daily before meals as per sliding scale Patient taking differently: Inject 0-10 Units into the skin 3 (three) times daily with meals. per sliding  scale. 06/19/17  Yes Charlott Rakes, MD  ibuprofen (ADVIL,MOTRIN) 800 MG tablet Take 800 mg by mouth every 8 (eight) hours as needed for cramping.   Yes [provider]  Insulin Glargine (LANTUS) 100 UNIT/ML Solostar Pen Inject 48 Units into the skin daily at 10 pm. Patient taking differently: Inject 48 Units into the skin 2 (two) times daily.  08/12/17  Yes Charlott Rakes, MD  blood glucose meter kit and supplies KIT Dispense based on patient and insurance preference. Use up to four times daily as directed. (FOR ICD-9 250.00, 250.01). 05/25/17   Isla Pence, MD  sulfamethoxazole-trimethoprim (BACTRIM DS,SEPTRA DS) 800-160 MG tablet Take 1 tablet by mouth 2 (two) times daily for 7 days. 12/10/17 12/17/17  Noemi Chapel, MD    Family History Family History  Problem Relation Age of Onset  . Asthma Mother   . Gout Father   . Diabetes Paternal Grandmother   . Anesthesia problems Neg Hx     Social History Social History   Tobacco Use  . Smoking status: Never Smoker  . Smokeless tobacco: Never Used  Substance Use Topics  . Alcohol use: No    Comment: occ  . Drug use: No     Allergies   Penicillins; Benadryl [diphenhydramine]; and Doxycycline   Review of Systems Review of Systems  All other systems reviewed and are negative.    Physical Exam Updated Vital Signs BP 112/72   Pulse (!) 107   Temp 98.7 F (37.1 C) (Oral)   Resp 18   Ht '5\' 2"'  (1.575 m)   SpO2 100%   BMI 23.27 kg/m   Physical Exam  Constitutional: She appears well-developed and well-nourished. No distress.  HENT:  Head: Normocephalic and atraumatic.  Mouth/Throat: No oropharyngeal exudate.  Mucous membranes slightly dehydrated  Eyes: Pupils are equal, round, and reactive to light. Conjunctivae and EOM are normal. Right eye exhibits no discharge. Left eye exhibits no discharge. No scleral icterus.  Neck: Normal range of motion. Neck supple. No JVD present. No thyromegaly present.    Cardiovascular: Regular rhythm, normal heart sounds and intact distal pulses. Exam reveals no gallop and no friction rub.  No murmur heard. Heart rate of 105  Pulmonary/Chest: Effort normal and breath sounds normal. No respiratory distress. She has no wheezes. She has no rales.  Peaks in full sentences, no respiratory distress, no hypoxia, no tachypnea, normal lung sounds.  Abdominal: Soft. Bowel sounds are normal. She exhibits no distension and no mass. There is no tenderness.  Soft and nontender abdomen  Musculoskeletal: Normal range of motion. She exhibits no edema or tenderness.  Lymphadenopathy:    She has no cervical adenopathy.  Neurological: She is alert. Coordination normal.  Skin: Skin is warm and dry. No rash noted. No erythema.  Psychiatric: She has a normal mood and affect. Her behavior is normal.  Nursing note and vitals reviewed.    ED Treatments / Results  Labs (all labs  ordered are listed, but only abnormal results are displayed) Labs Reviewed  BASIC METABOLIC PANEL - Abnormal; Notable for the following components:      Result Value   Sodium 132 (*)    Glucose, Bld 384 (*)    All other components within normal limits  URINALYSIS, ROUTINE W REFLEX MICROSCOPIC - Abnormal; Notable for the following components:   APPearance CLOUDY (*)    Glucose, UA >=500 (*)    Hgb urine dipstick SMALL (*)    Ketones, ur 5 (*)    Nitrite POSITIVE (*)    Leukocytes, UA MODERATE (*)    WBC, UA >50 (*)    Bacteria, UA MANY (*)    All other components within normal limits  CBG MONITORING, ED - Abnormal; Notable for the following components:   Glucose-Capillary 362 (*)    All other components within normal limits  CBG MONITORING, ED - Abnormal; Notable for the following components:   Glucose-Capillary 365 (*)    All other components within normal limits  CBG MONITORING, ED - Abnormal; Notable for the following components:   Glucose-Capillary 301 (*)    All other components  within normal limits  URINE CULTURE  CBC  I-STAT BETA HCG BLOOD, ED ([facility], [facility], [facility] ONLY)    EKG None  Radiology No results found.  Procedures Procedures (including critical care time)  Medications Ordered in ED Medications  sodium chloride 0.9 % bolus 1,000 mL (1,000 mLs Intravenous New Bag/Given 12/10/17 1023)  insulin aspart (novoLOG) injection 10 Units (10 Units Subcutaneous Given 12/10/17 1024)  sulfamethoxazole-trimethoprim (BACTRIM DS,SEPTRA DS) 800-160 MG per tablet 1 tablet (1 tablet Oral Given 12/10/17 1028)     Initial Impression / Assessment and Plan / ED Course  I have reviewed the triage vital signs and the nursing notes.  Pertinent labs & imaging results that were available during my care of the patient were reviewed by me and considered in my medical decision making (see chart for details).  Clinical Course as of Dec 10 1121  Tue Dec 10, 2017  1003 Urinary tract infection shows positive nitrite, greater than 50 white blood cells and many bacteria.  A urinary culture will be ordered and Bactriom will be given - PCN allergy   [BM]    Clinical Course User Index [BM] Noemi Chapel, MD    Blood sugar just under 400, will need to have IV fluids, some insulin, metabolic panel shows a sodium of 132, no increased anion gap, CO2 of 23.  This is not consistent with diabetic ketoacidosis.  The patient was reexamined after IV fluids, she feels better, her blood sugar is down to 300, she is willing to continue her treatment at home.  She has been given Bactrim for her urinary tract infection and a culture was ordered and added on.  She is in agreement to return should her symptoms worsen, her heart rate is 100 on my exam, she has strong pulses and no abdominal tenderness and no nausea.  Final Clinical Impressions(s) / ED Diagnoses   Final diagnoses:  Lower urinary tract infectious disease  Hyperglycemia    ED Discharge Orders         Ordered     sulfamethoxazole-trimethoprim (BACTRIM DS,SEPTRA DS) 800-160 MG tablet  2 times daily     12/10/17 1122           Noemi Chapel, MD 12/10/17 1123

## 2017-12-10 NOTE — ED Triage Notes (Addendum)
Pt in c/o hyperglycemia with BS running in the 400s, pt reports last taking 10 units of short acting insulin last night, denies taking insulin this morning, denies pain, denies n/v/d, A&O x4

## 2017-12-10 NOTE — Discharge Instructions (Signed)
Please take Bactrim, 1 tablet by mouth twice a day for 7 days.  This will treat your urinary infection.  Additionally please make sure that you are taking your insulin exactly as prescribed, eat a diabetic healthy diet low in carbohydrates or sugars.  See your doctor within 2 or 3 days for a recheck to make sure that things are improving however you should return to the emergency department for severe or worsening pain, vomiting, fever or difficulty breathing.

## 2017-12-12 LAB — URINE CULTURE: Culture: >=100000 — AB

## 2017-12-13 ENCOUNTER — Telehealth: Payer: Self-pay | Admitting: *Deleted

## 2017-12-13 NOTE — Telephone Encounter (Signed)
Post ED Visit - Positive Culture Follow-up  Culture report reviewed by antimicrobial stewardship pharmacist:  []  [name], Pharm.D. []  [name], Pharm.D., BCPS AQ-ID []  [name], Pharm.D., BCPS []  [name], Pharm.D., BCPS []  [place], Pharm.D., BCPS, AAHIVP []  [name], Pharm.D., BCPS, AAHIVP []  [name], PharmD, BCPS []  [name], PharmD, BCPS []  [name], PharmD, BCPS []  [name], PharmD Jackson Latino, PharmD  Positive urine culture Treated with Sulfamethoxazole-Trimethoprim, organism sensitive to the same and no further patient follow-up is required at this time.  Harlon Flor [name] 12/13/2017, 9:32 AM

## 2018-01-09 ENCOUNTER — Other Ambulatory Visit: Payer: Self-pay

## 2018-01-09 DIAGNOSIS — IMO0002 Reserved for concepts with insufficient information to code with codable children: Secondary | ICD-10-CM

## 2018-01-09 DIAGNOSIS — E1065 Type 1 diabetes mellitus with hyperglycemia: Secondary | ICD-10-CM

## 2018-01-09 DIAGNOSIS — E108 Type 1 diabetes mellitus with unspecified complications: Principal | ICD-10-CM

## 2018-01-09 MED ORDER — HUMALOG KWIKPEN 100 UNIT/ML ~~LOC~~ SOPN: PEN_INJECTOR | SUBCUTANEOUS | 0 refills | Status: DC

## 2018-02-04 ENCOUNTER — Encounter (HOSPITAL_COMMUNITY): Payer: Self-pay

## 2018-02-04 ENCOUNTER — Emergency Department (HOSPITAL_COMMUNITY)
Admission: EM | Admit: 2018-02-04 | Discharge: 2018-02-04 | Disposition: A | Discharge status: Home / Self Care | Payer: Self-pay | Attending: Emergency Medicine | Admitting: Emergency Medicine

## 2018-02-04 ENCOUNTER — Emergency Department (HOSPITAL_COMMUNITY): Payer: Self-pay

## 2018-02-04 DIAGNOSIS — Z794 Long term (current) use of insulin: Secondary | ICD-10-CM | POA: Insufficient documentation

## 2018-02-04 DIAGNOSIS — E1040 Type 1 diabetes mellitus with diabetic neuropathy, unspecified: Secondary | ICD-10-CM | POA: Insufficient documentation

## 2018-02-04 DIAGNOSIS — B9689 Other specified bacterial agents as the cause of diseases classified elsewhere: Secondary | ICD-10-CM | POA: Insufficient documentation

## 2018-02-04 DIAGNOSIS — R1013 Epigastric pain: Secondary | ICD-10-CM | POA: Insufficient documentation

## 2018-02-04 DIAGNOSIS — A599 Trichomoniasis, unspecified: Secondary | ICD-10-CM | POA: Insufficient documentation

## 2018-02-04 DIAGNOSIS — R109 Unspecified abdominal pain: Secondary | ICD-10-CM

## 2018-02-04 DIAGNOSIS — R1011 Right upper quadrant pain: Secondary | ICD-10-CM | POA: Insufficient documentation

## 2018-02-04 DIAGNOSIS — N760 Acute vaginitis: Secondary | ICD-10-CM | POA: Insufficient documentation

## 2018-02-04 LAB — LIPASE, BLOOD: Lipase: 32 U/L (ref 11–51)

## 2018-02-04 LAB — URINALYSIS, ROUTINE W REFLEX MICROSCOPIC
BILIRUBIN URINE: NEGATIVE
Glucose, UA: >=500 mg/dL — AB
HGB URINE DIPSTICK: NEGATIVE
Ketones, ur: NEGATIVE mg/dL
LEUKOCYTES UA: NEGATIVE
Nitrite: NEGATIVE
PH: 6 (ref 5.0–8.0)
Protein, ur: NEGATIVE mg/dL
SPECIFIC GRAVITY, URINE: 1.024 (ref 1.005–1.030)

## 2018-02-04 LAB — CBG MONITORING, ED
GLUCOSE-CAPILLARY: 230 mg/dL — AB (ref 70–99)
Glucose-Capillary: >600 mg/dL (ref 70–99)

## 2018-02-04 LAB — COMPREHENSIVE METABOLIC PANEL
ALBUMIN: 3.5 g/dL (ref 3.5–5.0)
ALK PHOS: 56 U/L (ref 38–126)
ALT: 18 U/L (ref 0–44)
AST: 19 U/L (ref 15–41)
Anion gap: 11 (ref 5–15)
BUN: 16 mg/dL (ref 6–20)
CALCIUM: 9.3 mg/dL (ref 8.9–10.3)
CO2: 19 mmol/L — AB (ref 22–32)
Chloride: 101 mmol/L (ref 98–111)
Creatinine, Ser: 0.72 mg/dL (ref 0.44–1.00)
GFR calc Af Amer: >60 mL/min (ref >60)
GFR calc non Af Amer: >60 mL/min (ref >60)
GLUCOSE: 610 mg/dL — AB (ref 70–99)
Potassium: 4.6 mmol/L (ref 3.5–5.1)
Sodium: 131 mmol/L — ABNORMAL LOW (ref 135–145)
Total Bilirubin: 0.6 mg/dL (ref 0.3–1.2)
Total Protein: 6.8 g/dL (ref 6.5–8.1)

## 2018-02-04 LAB — CBC
HCT: 38.3 % (ref 36.0–46.0)
Hemoglobin: 11.8 g/dL — ABNORMAL LOW (ref 12.0–15.0)
MCH: 25.8 pg — AB (ref 26.0–34.0)
MCHC: 30.8 g/dL (ref 30.0–36.0)
MCV: 83.8 fL (ref 80.0–100.0)
NRBC: 0 % (ref 0.0–0.2)
PLATELETS: 316 10*3/uL (ref 150–400)
RBC: 4.57 MIL/uL (ref 3.87–5.11)
RDW: 13.1 % (ref 11.5–15.5)
WBC: 5.9 10*3/uL (ref 4.0–10.5)

## 2018-02-04 LAB — WET PREP, GENITAL
Sperm: NONE SEEN
Yeast Wet Prep HPF POC: NONE SEEN

## 2018-02-04 LAB — I-STAT VENOUS BLOOD GAS, ED
Acid-base deficit: 2 mmol/L (ref 0.0–2.0)
Bicarbonate: 23.7 mmol/L (ref 20.0–28.0)
O2 Saturation: 80 %
PCO2 VEN: 42.4 mmHg — AB (ref 44.0–60.0)
TCO2: 25 mmol/L (ref 22–32)
pH, Ven: 7.355 (ref 7.250–7.430)
pO2, Ven: 46 mmHg — ABNORMAL HIGH (ref 32.0–45.0)

## 2018-02-04 LAB — I-STAT BETA HCG BLOOD, ED (MC, WL, AP ONLY): I-stat hCG, quantitative: <5 m[IU]/mL (ref <5)

## 2018-02-04 MED ORDER — GI COCKTAIL ~~LOC~~
30.0000 mL | Freq: Once | ORAL | Status: AC
  Administered 2018-02-04: 30 mL via ORAL
  Filled 2018-02-04: qty 30

## 2018-02-04 MED ORDER — LACTATED RINGERS IV BOLUS
2000.0000 mL | Freq: Once | INTRAVENOUS | Status: AC
  Administered 2018-02-04: 2000 mL via INTRAVENOUS

## 2018-02-04 MED ORDER — METOCLOPRAMIDE HCL 10 MG PO TABS: 10.0000 mg | ORAL_TABLET | Freq: Four times a day (QID) | ORAL | 0 refills | Status: DC

## 2018-02-04 MED ORDER — INSULIN ASPART 100 UNIT/ML ~~LOC~~ SOLN
6.0000 [IU] | Freq: Once | SUBCUTANEOUS | Status: DC
  Filled 2018-02-04: qty 1

## 2018-02-04 MED ORDER — INSULIN ASPART 100 UNIT/ML ~~LOC~~ SOLN
10.0000 [IU] | Freq: Once | SUBCUTANEOUS | Status: AC
  Administered 2018-02-04: 10 [IU] via SUBCUTANEOUS
  Filled 2018-02-04: qty 1

## 2018-02-04 MED ORDER — LACTATED RINGERS IV BOLUS: 1000.0000 mL | Freq: Once | INTRAVENOUS | Status: DC

## 2018-02-04 MED ORDER — METRONIDAZOLE 500 MG PO TABS: 500.0000 mg | ORAL_TABLET | Freq: Two times a day (BID) | ORAL | 0 refills | Status: AC

## 2018-02-04 NOTE — ED Triage Notes (Signed)
Patient complains of cold sympto ms and knots in hr scalp for several days. Complains of generalized abdominal pain with nausea

## 2018-02-04 NOTE — ED Provider Notes (Signed)
[facility] EMERGENCY DEPARTMENT Provider Note   CSN: [id number] Arrival date & time: 02/04/18  1343     History   Chief Complaint Chief Complaint  Patient presents with  . cough/ abd. pain    HPI Ashley Freeman is a 27 y.o. female.  HPI  27 year old female [place] for T1DM who presents with chief complaint of abdominal pain.  Patient states for last to 3 days she is epigastric/right upper quadrant abdominal pain, colicky, made worse when she has to urinate, nothing makes it better, 8/10 severity, unclear if associated with meals.  Patient endorses mild anorexia, denies fevers.  Patient states she had similar feeling abdominal pain with prior UTI.  Patient denies burning on urination or urinary frequency or foul odor to urine.  Endorses intermittent back pain.  Denies chest pain shortness of breath.  Endorses intermittent bolus sensation described as feeling like food is "stuck" in her midesophagus.  Patient able to tolerate liquids and solids without difficulty.  Patient without choking episode.  Patient denies history of similar feeling in the past.  Denies nausea or emesis.  Denies vaginal symptoms.  Last BM was yesterday and normal.  Patient has not been checking her blood sugars for several days due to not having her diabetic supplies.  Denies fevers  Past Medical History:  Diagnosis Date  . Diabetes mellitus 2001   Diagnosed at age 81 ; Type I  . Diarrhea 05/30/2016  . DKA (diabetic ketoacidoses) ([place]) 08/19/2013  . DKA (diabetic ketoacidoses) ([name]) 05/30/2016  . Gonorrhea 08/2011   Treated in 09/2011    Patient Active Problem List   Diagnosis Date Noted  . DKA (diabetic ketoacidosis) ([place]) 12/29/2016  . Inguinal abscess   . History of trichomoniasis 05/31/2016  . Diarrhea 05/31/2016  . DKA (diabetic ketoacidoses) ([place]) 05/30/2016  . Hyperlipidemia 03/28/2016  . Diabetic neuropathy ([place]) 01/16/2016  . DKA, type 1 ([place]) 11/24/2015  . BV  (bacterial vaginosis) 11/24/2015  . Chest pain 11/24/2015  . AKI (acute kidney injury) ([place]) 07/26/2014  . Infection of urinary tract 04/08/2014  . PID (acute pelvic inflammatory disease) 04/06/2014  . Diabetic ketoacidosis without coma associated with type 1 diabetes mellitus ([place]) 09/19/2013  . Infected cyst of Bartholin's gland duct 09/19/2013  . Bartholin's gland abscess 09/19/2013  . Sepsis ([place]) 09/19/2013  . Abscess, gluteal, right 08/24/2013  . Health care maintenance 08/24/2013  . Hypophosphatemia 08/18/2013  . Hypomagnesemia 08/18/2013  . Leukocytosis, unspecified 08/17/2013  . Fever, unspecified 08/17/2013  . H/O medication noncompliance 08/12/2013  . Hyponatremia 08/04/2012  . Anemia 02/19/2012  . Hyperglycemia 11/06/2011  . Tachycardia 10/17/2011  . Diabetes mellitus type 1, uncontrolled, with complications ([place]) 35/00/9381    Past Surgical History:  Procedure Laterality Date  . INCISION AND DRAINAGE PERIRECTAL ABSCESS Right 08/18/2013   Procedure: IRRIGATION AND DEBRIDEMENT GLUTEAL ABSCESS;  Surgeon: Ralene Ok, MD;  Location: [facility];  Service: General;  Laterality: Right;  . INCISION AND DRAINAGE PERIRECTAL ABSCESS Right 09/19/2013   Procedure: IRRIGATION AND DEBRIDEMENT RIGHT GLUTEAL AND LABIAL ABSCESSES;  Surgeon: Ralene Ok, MD;  Location: [facility];  Service: General;  Laterality: Right;  . INCISION AND DRAINAGE PERIRECTAL ABSCESS Right 09/24/2013   Procedure: IRRIGATION AND DEBRIDEMENT PERIRECTAL ABSCESS;  Surgeon: Gwenyth Ober, MD;  Location: [facility];  Service: General;  Laterality: Right;     OB History    Gravida  1   Para  0   Term      Preterm  AB  1   Living  0     SAB  1   TAB      Ectopic      Multiple      Live Births               Home Medications    Prior to Admission medications   Medication Sig Start Date End Date Taking? Authorizing Provider  insulin detemir (LEVEMIR) 100 UNIT/ML injection Inject 1-10 Units into  the skin as needed. Sliding scale depends on what she eats   Yes [provider]  Insulin Glargine (LANTUS) 100 UNIT/ML Solostar Pen Inject 48 Units into the skin daily at 10 pm. Patient taking differently: Inject 40 Units into the skin 2 (two) times daily.  08/12/17  Yes Charlott Rakes, MD  blood glucose meter kit and supplies KIT Dispense based on patient and insurance preference. Use up to four times daily as directed. (FOR ICD-9 250.00, 250.01). 05/25/17   Isla Pence, MD  HUMALOG KWIKPEN 100 UNIT/ML KiwkPen 0-12 units subcutaneously 3 times daily before meals as per sliding scale. MUST MAKE APPT FOR FURTHER REFILLS Patient not taking: Reported on 02/04/2018 01/09/18   Charlott Rakes, MD  metroNIDAZOLE (FLAGYL) 500 MG tablet Take 1 tablet (500 mg total) by mouth 2 (two) times daily for 7 days. 02/04/18 02/11/18  Keenan Bachelor, MD    Family History Family History  Problem Relation Age of Onset  . Asthma Mother   . Gout Father   . Diabetes Paternal Grandmother   . Anesthesia problems Neg Hx     Social History Social History   Tobacco Use  . Smoking status: Never Smoker  . Smokeless tobacco: Never Used  Substance Use Topics  . Alcohol use: No    Comment: occ  . Drug use: No     Allergies   Penicillins; Benadryl [diphenhydramine]; and Doxycycline   Review of Systems Review of Systems  Constitutional: Negative for chills and fever.  HENT: Negative for ear pain and sore throat.   Eyes: Negative for pain and visual disturbance.  Respiratory: Negative for cough and shortness of breath.   Cardiovascular: Negative for chest pain and palpitations.  Gastrointestinal: Positive for abdominal pain. Negative for vomiting.  Genitourinary: Positive for flank pain. Negative for decreased urine volume, dysuria, frequency, hematuria, menstrual problem, urgency, vaginal bleeding and vaginal discharge.  Musculoskeletal: Negative for arthralgias and back pain.  Skin: Negative for  color change and rash.  Neurological: Negative for seizures and syncope.  All other systems reviewed and are negative.    Physical Exam Updated Vital Signs BP (!) 143/99   Pulse 87   Temp 98 F (36.7 C) (Oral)   Resp 18   SpO2 96%   Physical Exam  Constitutional: She appears well-developed and well-nourished. No distress.  HENT:  Head: Normocephalic and atraumatic.  Eyes: Conjunctivae are normal.  Neck: Neck supple.  Cardiovascular: Normal rate and regular rhythm.  No murmur heard. Pulmonary/Chest: Effort normal and breath sounds normal. No respiratory distress.  Abdominal: Soft. There is tenderness in the right upper quadrant and suprapubic area. There is CVA tenderness (Right) and positive Murphy's sign. There is no rigidity, no rebound, no guarding and no tenderness at McBurney's point.  Musculoskeletal: She exhibits no edema.  Neurological: She is alert.  Skin: Skin is warm and dry.  Psychiatric: She has a normal mood and affect.  Nursing note and vitals reviewed.    ED Treatments / Results  Labs (all labs  ordered are listed, but only abnormal results are displayed) Labs Reviewed  WET PREP, GENITAL - Abnormal; Notable for the following components:      Result Value   Trich, Wet Prep PRESENT (*)    Clue Cells Wet Prep HPF POC PRESENT (*)    WBC, Wet Prep HPF POC MANY (*)    All other components within normal limits  COMPREHENSIVE METABOLIC PANEL - Abnormal; Notable for the following components:   Sodium 131 (*)    CO2 19 (*)    Glucose, Bld 610 (*)    All other components within normal limits  CBC - Abnormal; Notable for the following components:   Hemoglobin 11.8 (*)    MCH 25.8 (*)    All other components within normal limits  URINALYSIS, ROUTINE W REFLEX MICROSCOPIC - Abnormal; Notable for the following components:   Color, Urine STRAW (*)    APPearance HAZY (*)    Glucose, UA >=500 (*)    Bacteria, UA RARE (*)    Trichomonas, UA PRESENT (*)    All  other components within normal limits  CBG MONITORING, ED - Abnormal; Notable for the following components:   Glucose-Capillary >600 (*)    All other components within normal limits  I-STAT VENOUS BLOOD GAS, ED - Abnormal; Notable for the following components:   pCO2, Ven 42.4 (*)    pO2, Ven 46.0 (*)    All other components within normal limits  CBG MONITORING, ED - Abnormal; Notable for the following components:   Glucose-Capillary 230 (*)    All other components within normal limits  LIPASE, BLOOD  RPR  HIV ANTIBODY (ROUTINE TESTING W REFLEX)  I-STAT BETA HCG BLOOD, ED ([facility], [facility], [facility] ONLY)  GC/CHLAMYDIA PROBE AMP ([place]) NOT AT [facility]    EKG None  Radiology US Abdomen Limited Ruq  Result Date: 02/04/2018 CLINICAL DATA:  Abdominal pain EXAM: ULTRASOUND ABDOMEN LIMITED RIGHT UPPER QUADRANT COMPARISON:  None. FINDINGS: Gallbladder: No gallstones or wall thickening visualized. No sonographic Murphy sign noted by sonographer. Common bile duct: Diameter: 3 mm Liver: No focal lesion identified. Within normal limits in parenchymal echogenicity. Portal vein is patent on color Doppler imaging with normal direction of blood flow towards the liver. IMPRESSION: Negative right upper quadrant ultrasound. Electronically Signed   By: Monte Fantasia M.D.   On: 02/04/2018 17:43    Procedures Procedures (including critical care time)  Medications Ordered in ED Medications  lactated ringers bolus 2,000 mL (2,000 mLs Intravenous New Bag/Given 02/04/18 1657)  gi cocktail (Maalox,Lidocaine,Donnatal) (30 mLs Oral Given 02/04/18 1758)  insulin aspart (novoLOG) injection 10 Units (10 Units Subcutaneous Given 02/04/18 1807)     Initial Impression / Assessment and Plan / ED Course  I have reviewed the triage vital signs and the nursing notes.  Pertinent labs & imaging results that were available during my care of the patient were reviewed by me and considered in my medical decision making (see chart  for details).     27 year old female [place] for T1DM who presents with chief complaint of abdominal pain.  History as above.  DDX includes DKA, biliary colic, cholecystitis, cholelithiasis, UTI, pyelonephritis.  Labs and imaging performed.  hCG negative, UA negative for infection, negative for ketones, UA positive for trichomoniasis blood glucose 610, CO2 19, AG 11 sodium 131 yet corrects to 139 for glucose.  Urine and wet prep positive for trichomoniasis and BV.  STI screening sent and pending.  Will not return during today's hospitalization.  Patient will be notified of results at future date.  [place] negative for biliary pathology.  Patient given 2 L LR, 10 units of subcu insulin. Replete blood glucose 230.  Patient prescribed Flagyl for BV and trichomoniasis.  Advised outpatient follow-up with PCP for STI testing results.  Patient advised to seek out a blood glucose monitor so that she can test her sugars.  Patient states understanding and agreement plan.  Patient ready and stable for discharge.  Patient seen contact with my attending Dr. Ayesha Rumpf agrees with plan of disposition.    Final Clinical Impressions(s) / ED Diagnoses   Final diagnoses:  Abdominal pain  Trichomoniasis  Bacterial vaginosis    ED Discharge Orders         Ordered    metroNIDAZOLE (FLAGYL) 500 MG tablet  2 times daily     02/04/18 1943           Keenan Bachelor, MD 02/04/18 1945    Quintella Reichert, MD 02/05/18 1526

## 2018-02-04 NOTE — ED Notes (Signed)
PT states understanding of care given, follow up care, and medication prescribed. PT ambulated from ED to car with a steady gait. 

## 2018-02-04 NOTE — ED Notes (Signed)
Patient transported to Ultrasound 

## 2018-02-04 NOTE — ED Notes (Signed)
Pelvic setup at bedside.

## 2018-02-04 NOTE — ED Notes (Signed)
CBG collected. Result "230." RN, Minna Merritts, notified.

## 2018-02-05 LAB — HIV ANTIBODY (ROUTINE TESTING W REFLEX): HIV Screen 4th Generation wRfx: NONREACTIVE

## 2018-02-05 LAB — GC/CHLAMYDIA PROBE AMP (~~LOC~~) NOT AT ARMC
Chlamydia: NEGATIVE
NEISSERIA GONORRHEA: NEGATIVE

## 2018-02-05 LAB — RPR: RPR Ser Ql: NONREACTIVE

## 2018-02-25 ENCOUNTER — Emergency Department (HOSPITAL_COMMUNITY)
Admission: EM | Admit: 2018-02-25 | Discharge: 2018-02-25 | Discharge status: Against Medical Advice | Payer: Medicaid Other | Attending: Emergency Medicine | Admitting: Emergency Medicine

## 2018-02-25 ENCOUNTER — Encounter (HOSPITAL_COMMUNITY): Payer: Self-pay

## 2018-02-25 DIAGNOSIS — E1065 Type 1 diabetes mellitus with hyperglycemia: Secondary | ICD-10-CM | POA: Insufficient documentation

## 2018-02-25 DIAGNOSIS — E876 Hypokalemia: Secondary | ICD-10-CM

## 2018-02-25 DIAGNOSIS — R739 Hyperglycemia, unspecified: Secondary | ICD-10-CM

## 2018-02-25 LAB — URINALYSIS, ROUTINE W REFLEX MICROSCOPIC
Bilirubin Urine: NEGATIVE
KETONES UR: NEGATIVE mg/dL
NITRITE: NEGATIVE
PROTEIN: NEGATIVE mg/dL
RBC / HPF: >50 RBC/hpf — ABNORMAL HIGH (ref 0–5)
Specific Gravity, Urine: 1.01 (ref 1.005–1.030)
pH: 6 (ref 5.0–8.0)

## 2018-02-25 LAB — CBC
HEMATOCRIT: 38 % (ref 36.0–46.0)
Hemoglobin: 12.2 g/dL (ref 12.0–15.0)
MCH: 27 pg (ref 26.0–34.0)
MCHC: 32.1 g/dL (ref 30.0–36.0)
MCV: 84.1 fL (ref 80.0–100.0)
Platelets: 343 10*3/uL (ref 150–400)
RBC: 4.52 MIL/uL (ref 3.87–5.11)
RDW: 13.1 % (ref 11.5–15.5)
WBC: 6.2 10*3/uL (ref 4.0–10.5)
nRBC: 0 % (ref 0.0–0.2)

## 2018-02-25 LAB — BASIC METABOLIC PANEL
Anion gap: 10 (ref 5–15)
BUN: 19 mg/dL (ref 6–20)
CHLORIDE: 104 mmol/L (ref 98–111)
CO2: 23 mmol/L (ref 22–32)
CREATININE: 0.93 mg/dL (ref 0.44–1.00)
Calcium: 9.1 mg/dL (ref 8.9–10.3)
GFR calc Af Amer: >60 mL/min (ref >60)
GFR calc non Af Amer: >60 mL/min (ref >60)
Glucose, Bld: 241 mg/dL — ABNORMAL HIGH (ref 70–99)
POTASSIUM: 3.1 mmol/L — AB (ref 3.5–5.1)
Sodium: 137 mmol/L (ref 135–145)

## 2018-02-25 LAB — CBG MONITORING, ED
GLUCOSE-CAPILLARY: 362 mg/dL — AB (ref 70–99)
GLUCOSE-CAPILLARY: 170 mg/dL — AB (ref 70–99)
Glucose-Capillary: 115 mg/dL — ABNORMAL HIGH (ref 70–99)

## 2018-02-25 LAB — I-STAT BETA HCG BLOOD, ED (MC, WL, AP ONLY): I-stat hCG, quantitative: <5 m[IU]/mL (ref <5)

## 2018-02-25 MED ORDER — MAGNESIUM SULFATE 2 GM/50ML IV SOLN
2.0000 g | Freq: Once | INTRAVENOUS | Status: AC
  Administered 2018-02-25: 2 g via INTRAVENOUS
  Filled 2018-02-25: qty 50

## 2018-02-25 MED ORDER — LACTATED RINGERS IV BOLUS
2000.0000 mL | Freq: Once | INTRAVENOUS | Status: AC
  Administered 2018-02-25: 2000 mL via INTRAVENOUS

## 2018-02-25 MED ORDER — INSULIN ASPART 100 UNIT/ML ~~LOC~~ SOLN
10.0000 [IU] | Freq: Once | SUBCUTANEOUS | Status: AC
  Administered 2018-02-25: 10 [IU] via INTRAVENOUS
  Filled 2018-02-25: qty 1

## 2018-02-25 MED ORDER — POTASSIUM CHLORIDE CRYS ER 20 MEQ PO TBCR
40.0000 meq | EXTENDED_RELEASE_TABLET | Freq: Once | ORAL | Status: AC
  Administered 2018-02-25: 40 meq via ORAL
  Filled 2018-02-25: qty 2

## 2018-02-25 MED ORDER — POTASSIUM CHLORIDE 10 MEQ/100ML IV SOLN
10.0000 meq | Freq: Once | INTRAVENOUS | Status: AC
  Administered 2018-02-25: 10 meq via INTRAVENOUS
  Filled 2018-02-25: qty 100

## 2018-02-25 NOTE — ED Triage Notes (Signed)
Patient arrived via POV.  -Hx. Diabetes   -c/o dizziness X2 weeks. Unable to stand for long periods of time. Patient finds herself falling over after getting up or standing for too long.   -C/o weakness and drowsiness for X2 weeks.   -Patient states she usually has this problem when her diabetes is not controlled.

## 2018-02-25 NOTE — ED Provider Notes (Signed)
  Physical Exam  BP 119/89   Pulse (!) 115   Temp 98.5 F (36.9 C) (Oral)   Resp 18   Ht 5\' 3"  (1.6 m)   LMP 02/25/2018   SpO2 100%   BMI 22.54 kg/m   Physical Exam  ED Course/Procedures     Procedures  MDM  Care assumed at 4 pm. Patient here with hyperglycemia, hypokalemia. Sign out pending IVF, potassium IV and recheck CBG.   9:11 PM Patient was a difficult stick and I placed [place] IV. Given potassium run, Glucose down to 115. No AG on CMP. Stable for discharge. Patient left before receiving DC paperwork    Drenda Freeze, MD 02/25/18 2112

## 2018-02-25 NOTE — Discharge Instructions (Signed)
Stay hydrated.   See your doctor.   Eat foods high in potassium   Return to ER if you have severe abdominal pain, glucose > 500, vomiting, fever

## 2018-02-25 NOTE — ED Notes (Signed)
Attempted IV start x2. Unsuccessful.  2nd RN attempted IV start, also unsuccessful.

## 2018-02-25 NOTE — ED Notes (Signed)
Told pt provider would be in shortly to discuss any follow up that is necessary prior to providing her with discharge papers. Pt left without receiving discharge instructions and without signing out.

## 2018-02-25 NOTE — ED Provider Notes (Signed)
Emergency Department Provider Note   I have reviewed the triage vital signs and the nursing notes.   HISTORY  Chief Complaint Dizziness and Weakness   HPI Ashley Freeman is a 27 y.o. female with history of diabetes and DKA the presents the emergency department today secondary to hyperglycemia, polyuria and polydipsia, weakness dizziness that is been progressively worsening for the last couple weeks.  She has been checking her blood sugar secondary to being out of supplies but has been taking insulin just not sure she is doing a short acting correctly.  Her blood sugars have been above 300 the last few times she has checked. No other associated symptoms.  No other associated or modifying symptoms.    Past Medical History:  Diagnosis Date  . Diabetes mellitus 2001   Diagnosed at age 61 ; Type I  . Diarrhea 05/30/2016  . DKA (diabetic ketoacidoses) ([place]) 08/19/2013  . DKA (diabetic ketoacidoses) ([place]) 05/30/2016  . Gonorrhea 08/2011   Treated in 09/2011    Patient Active Problem List   Diagnosis Date Noted  . DKA (diabetic ketoacidosis) ([place]) 12/29/2016  . Inguinal abscess   . History of trichomoniasis 05/31/2016  . Diarrhea 05/31/2016  . DKA (diabetic ketoacidoses) ([place]) 05/30/2016  . Hyperlipidemia 03/28/2016  . Diabetic neuropathy ([place]) 01/16/2016  . DKA, type 1 ([place]) 11/24/2015  . BV (bacterial vaginosis) 11/24/2015  . Chest pain 11/24/2015  . AKI (acute kidney injury) ([place]) 07/26/2014  . Infection of urinary tract 04/08/2014  . PID (acute pelvic inflammatory disease) 04/06/2014  . Diabetic ketoacidosis without coma associated with type 1 diabetes mellitus ([place]) 09/19/2013  . Infected cyst of Bartholin's gland duct 09/19/2013  . Bartholin's gland abscess 09/19/2013  . Sepsis ([place]) 09/19/2013  . Abscess, gluteal, right 08/24/2013  . Health care maintenance 08/24/2013  . Hypophosphatemia 08/18/2013  . Hypomagnesemia 08/18/2013  . Leukocytosis, unspecified  08/17/2013  . Fever, unspecified 08/17/2013  . H/O medication noncompliance 08/12/2013  . Hyponatremia 08/04/2012  . Anemia 02/19/2012  . Hyperglycemia 11/06/2011  . Tachycardia 10/17/2011  . Diabetes mellitus type 1, uncontrolled, with complications ([place]) 61/95/0932    Past Surgical History:  Procedure Laterality Date  . INCISION AND DRAINAGE PERIRECTAL ABSCESS Right 08/18/2013   Procedure: IRRIGATION AND DEBRIDEMENT GLUTEAL ABSCESS;  Surgeon: Ralene Ok, MD;  Location: [facility];  Service: General;  Laterality: Right;  . INCISION AND DRAINAGE PERIRECTAL ABSCESS Right 09/19/2013   Procedure: IRRIGATION AND DEBRIDEMENT RIGHT GLUTEAL AND LABIAL ABSCESSES;  Surgeon: Ralene Ok, MD;  Location: [facility];  Service: General;  Laterality: Right;  . INCISION AND DRAINAGE PERIRECTAL ABSCESS Right 09/24/2013   Procedure: IRRIGATION AND DEBRIDEMENT PERIRECTAL ABSCESS;  Surgeon: Gwenyth Ober, MD;  Location: [facility];  Service: General;  Laterality: Right;    Current Outpatient Rx  . Order #: [id number] Class: Normal  . Order #: [id number] Class: Historical Med  . Order #: [id number] Class: Normal  . Order #: [id number] Class: Print  . Order #: [id number] Class: Print    Allergies Penicillins; Benadryl [diphenhydramine]; and Doxycycline  Family History  Problem Relation Age of Onset  . Asthma Mother   . Gout Father   . Diabetes Paternal Grandmother   . Anesthesia problems Neg Hx     Social History Social History   Tobacco Use  . Smoking status: Never Smoker  . Smokeless tobacco: Never Used  Substance Use Topics  . Alcohol use: No    Comment: occ  . Drug use: No    Review of Systems  All other systems negative except as documented in the HPI. All pertinent positives and negatives as reviewed in the HPI. ____________________________________________   PHYSICAL EXAM:  VITAL SIGNS: ED Triage Vitals  Enc Vitals Group     BP 02/25/18 1318 97/72     Pulse Rate 02/25/18 1318 (!) 115      Resp 02/25/18 1318 13     Temp 02/25/18 1318 98.5 F (36.9 C)     Temp Source 02/25/18 1318 Oral     SpO2 02/25/18 1318 95 %     Weight --      Height 02/25/18 1318 5\' 3"  (1.6 m)     Head Circumference --      Peak Flow --      Pain Score 02/25/18 1320 0     Pain Loc --      Pain Edu? --      Excl. in [place]? --    Constitutional: Alert and oriented. Well appearing and in no acute distress. Eyes: Conjunctivae are normal. PERRL. EOMI. Head: Atraumatic. Nose: No congestion/rhinnorhea. Mouth/Throat: Mucous membranes are dry.  Oropharynx non-erythematous. Neck: No stridor.  No meningeal signs.   Cardiovascular: tachycardic rate, regular rhythm. Good peripheral circulation. Grossly normal heart sounds.   Respiratory: Normal respiratory effort.  No retractions. Lungs CTAB. Gastrointestinal: Soft and nontender. No distention.  Musculoskeletal: No lower extremity tenderness nor edema. No gross deformities of extremities. Neurologic:  Normal speech and language. No gross focal neurologic deficits are appreciated.  Skin:  Skin is warm, dry and intact. Eczematous rash on hands noted.  ____________________________________________   LABS (all labs ordered are listed, but only abnormal results are displayed)  Labs Reviewed  BASIC METABOLIC PANEL - Abnormal; Notable for the following components:      Result Value   Potassium 3.1 (*)    Glucose, Bld 241 (*)    All other components within normal limits  CBG MONITORING, ED - Abnormal; Notable for the following components:   Glucose-Capillary 362 (*)    All other components within normal limits  CBC  URINALYSIS, ROUTINE W REFLEX MICROSCOPIC  I-STAT BETA HCG BLOOD, ED ([facility], [facility], [facility] ONLY)   ____________________________________________  EKG   EKG Interpretation  Date/Time:  Tuesday February 25 2018 13:14:37 EDT Ventricular Rate:  109 PR Interval:    QRS Duration: 65 QT Interval:  376 QTC Calculation: 507 R Axis:   71 Text  Interpretation:  Sinus tachycardia Probable left atrial enlargement Borderline T abnormalities, diffuse leads Prolonged QT interval faster rate, less prominent T waves, otherwise no significant changes from june 2019 Confirmed by Merrily Pew [id number]) on 02/25/2018 2:13:40 PM       ____________________________________________  RADIOLOGY  No results found.  ____________________________________________   PROCEDURES  Procedure(s) performed:   Procedures   ____________________________________________   INITIAL IMPRESSION / ASSESSMENT AND PLAN / ED COURSE  Hyperglycemia without DKA.  Also is hypokalemic.  Will give fluids, potassium and magnesium.  Patient can be discharged after that if vital signs remain improved.     Pertinent labs & imaging results that were available during my care of the patient were reviewed by me and considered in my medical decision making (see chart for details).  ____________________________________________  FINAL CLINICAL IMPRESSION(S) / ED DIAGNOSES  Final diagnoses:  None     MEDICATIONS GIVEN DURING THIS VISIT:  Medications  lactated ringers bolus 2,000 mL (has no administration in time range)  potassium chloride 10 mEq in 100 mL IVPB (has no administration in  time range)  potassium chloride SA (K-DUR,KLOR-CON) CR tablet 40 mEq (has no administration in time range)  magnesium sulfate IVPB 2 g 50 mL (has no administration in time range)  insulin aspart (novoLOG) injection 10 Units (10 Units Intravenous Given 02/25/18 1413)     NEW OUTPATIENT MEDICATIONS STARTED DURING THIS VISIT:  New Prescriptions   No medications on file    Note:  This note was prepared with assistance of Dragon voice recognition software. Occasional wrong-word or sound-a-like substitutions may have occurred due to the inherent limitations of voice recognition software.   Merrily Pew, MD 02/25/18 [phone]

## 2018-02-25 NOTE — ED Notes (Signed)
PT INFORMED UA IS NEEDED!

## 2018-04-12 ENCOUNTER — Encounter (HOSPITAL_COMMUNITY): Payer: Self-pay

## 2018-04-12 ENCOUNTER — Other Ambulatory Visit: Payer: Self-pay

## 2018-04-12 ENCOUNTER — Emergency Department (HOSPITAL_COMMUNITY)
Admission: EM | Admit: 2018-04-12 | Discharge: 2018-04-12 | Disposition: A | Discharge status: Home / Self Care | Payer: Medicaid Other | Attending: Emergency Medicine | Admitting: Emergency Medicine

## 2018-04-12 DIAGNOSIS — R197 Diarrhea, unspecified: Secondary | ICD-10-CM | POA: Insufficient documentation

## 2018-04-12 DIAGNOSIS — N390 Urinary tract infection, site not specified: Secondary | ICD-10-CM

## 2018-04-12 DIAGNOSIS — Z794 Long term (current) use of insulin: Secondary | ICD-10-CM | POA: Insufficient documentation

## 2018-04-12 DIAGNOSIS — Z79899 Other long term (current) drug therapy: Secondary | ICD-10-CM | POA: Insufficient documentation

## 2018-04-12 DIAGNOSIS — E109 Type 1 diabetes mellitus without complications: Secondary | ICD-10-CM | POA: Insufficient documentation

## 2018-04-12 LAB — URINALYSIS, ROUTINE W REFLEX MICROSCOPIC
BACTERIA UA: NONE SEEN
BILIRUBIN URINE: NEGATIVE
Glucose, UA: >=500 mg/dL — AB
KETONES UR: NEGATIVE mg/dL
Nitrite: NEGATIVE
Protein, ur: NEGATIVE mg/dL
SPECIFIC GRAVITY, URINE: 1.022 (ref 1.005–1.030)
pH: 6 (ref 5.0–8.0)

## 2018-04-12 LAB — COMPREHENSIVE METABOLIC PANEL
ALBUMIN: 3.8 g/dL (ref 3.5–5.0)
ALT: 36 U/L (ref 0–44)
AST: 67 U/L — AB (ref 15–41)
Alkaline Phosphatase: 66 U/L (ref 38–126)
Anion gap: 10 (ref 5–15)
BUN: 11 mg/dL (ref 6–20)
CHLORIDE: 103 mmol/L (ref 98–111)
CO2: 23 mmol/L (ref 22–32)
Calcium: 9.3 mg/dL (ref 8.9–10.3)
Creatinine, Ser: 0.68 mg/dL (ref 0.44–1.00)
GFR calc Af Amer: >60 mL/min (ref >60)
GFR calc non Af Amer: >60 mL/min (ref >60)
Glucose, Bld: 169 mg/dL — ABNORMAL HIGH (ref 70–99)
Potassium: 3.5 mmol/L (ref 3.5–5.1)
Sodium: 136 mmol/L (ref 135–145)
Total Bilirubin: 0.5 mg/dL (ref 0.3–1.2)
Total Protein: 7.1 g/dL (ref 6.5–8.1)

## 2018-04-12 LAB — CBC
HEMATOCRIT: 39.2 % (ref 36.0–46.0)
Hemoglobin: 12.8 g/dL (ref 12.0–15.0)
MCH: 27.6 pg (ref 26.0–34.0)
MCHC: 32.7 g/dL (ref 30.0–36.0)
MCV: 84.5 fL (ref 80.0–100.0)
Platelets: 324 10*3/uL (ref 150–400)
RBC: 4.64 MIL/uL (ref 3.87–5.11)
RDW: 12.8 % (ref 11.5–15.5)
WBC: 8.2 10*3/uL (ref 4.0–10.5)
nRBC: 0 % (ref 0.0–0.2)

## 2018-04-12 LAB — I-STAT BETA HCG BLOOD, ED (MC, WL, AP ONLY)

## 2018-04-12 LAB — LIPASE, BLOOD: LIPASE: 22 U/L (ref 11–51)

## 2018-04-12 MED ORDER — NITROFURANTOIN MONOHYD MACRO 100 MG PO CAPS: 100.0000 mg | ORAL_CAPSULE | Freq: Two times a day (BID) | ORAL | 0 refills | Status: DC

## 2018-04-12 MED ORDER — SODIUM CHLORIDE 0.9 % IV SOLN
1.0000 g | Freq: Once | INTRAVENOUS | Status: AC
  Administered 2018-04-12: 1 g via INTRAVENOUS
  Filled 2018-04-12: qty 10

## 2018-04-12 MED ORDER — ONDANSETRON HCL 4 MG/2ML IJ SOLN
4.0000 mg | Freq: Once | INTRAMUSCULAR | Status: AC
  Administered 2018-04-12: 4 mg via INTRAVENOUS
  Filled 2018-04-12: qty 2

## 2018-04-12 MED ORDER — FLUCONAZOLE 150 MG PO TABS
150.0000 mg | ORAL_TABLET | Freq: Once | ORAL | Status: AC
  Administered 2018-04-12: 150 mg via ORAL
  Filled 2018-04-12: qty 1

## 2018-04-12 MED ORDER — SODIUM CHLORIDE 0.9 % IV BOLUS
1000.0000 mL | Freq: Once | INTRAVENOUS | Status: AC
  Administered 2018-04-12: 1000 mL via INTRAVENOUS

## 2018-04-12 MED ORDER — MORPHINE SULFATE (PF) 4 MG/ML IV SOLN
4.0000 mg | Freq: Once | INTRAVENOUS | Status: AC
  Administered 2018-04-12: 4 mg via INTRAVENOUS
  Filled 2018-04-12: qty 1

## 2018-04-12 NOTE — ED Triage Notes (Signed)
Patient c/o generalized abdominal pain, nausea x 1 week and diarrhea x 3 weeks.

## 2018-04-12 NOTE — ED Provider Notes (Signed)
[facility] DEPT Provider Note   CSN: [id number] Arrival date & time: 04/12/18  1017     History   Chief Complaint Chief Complaint  Patient presents with  . Abdominal Pain  . Nausea  . Diarrhea    HPI Ashley Freeman is a 27 y.o. female.  The history is provided by the patient. No language interpreter was used.  Abdominal Pain   Associated symptoms include diarrhea.  Diarrhea   Associated symptoms include abdominal pain.     27 year old female with type 1 insulin-dependent diabetes presenting to the ED with complaints of abdominal discomfort.  Patient report for the past 3 weeks she has had generalized abdominal discomfort.  She describes a crampy achy sensation and also endorsed having nausea and persistent diarrhea.  States recently her diarrhea has increased in frequency, multiple episodes throughout the day of nonbloody bloody non-mucousy diarrhea.  She denies any associated vomiting.  She does not normally monitor her blood sugar as she should but does take her medication.  She denies any fever or chills, no chest pain shortness of breath productive cough or URI symptoms.  She denies any dysuria.  She does recall taking an antibiotic a month ago for STI.  She took it for 1 week.  She denies any recent sick contact or recent travel.  Past Medical History:  Diagnosis Date  . Diabetes mellitus 2001   Diagnosed at age 46 ; Type I  . Diarrhea 05/30/2016  . DKA (diabetic ketoacidoses) ([place]) 08/19/2013  . DKA (diabetic ketoacidoses) ([place]) 05/30/2016  . Gonorrhea 08/2011   Treated in 09/2011    Patient Active Problem List   Diagnosis Date Noted  . DKA (diabetic ketoacidosis) ([place]) 12/29/2016  . Inguinal abscess   . History of trichomoniasis 05/31/2016  . Diarrhea 05/31/2016  . DKA (diabetic ketoacidoses) ([place]) 05/30/2016  . Hyperlipidemia 03/28/2016  . Diabetic neuropathy ([place]) 01/16/2016  . DKA, type 1 ([place]) 11/24/2015  . BV (bacterial  vaginosis) 11/24/2015  . Chest pain 11/24/2015  . AKI (acute kidney injury) ([place]) 07/26/2014  . Infection of urinary tract 04/08/2014  . PID (acute pelvic inflammatory disease) 04/06/2014  . Diabetic ketoacidosis without coma associated with type 1 diabetes mellitus ([place]) 09/19/2013  . Infected cyst of Bartholin's gland duct 09/19/2013  . Bartholin's gland abscess 09/19/2013  . Sepsis ([place]) 09/19/2013  . Abscess, gluteal, right 08/24/2013  . Health care maintenance 08/24/2013  . Hypophosphatemia 08/18/2013  . Hypomagnesemia 08/18/2013  . Leukocytosis, unspecified 08/17/2013  . Fever, unspecified 08/17/2013  . H/O medication noncompliance 08/12/2013  . Hyponatremia 08/04/2012  . Anemia 02/19/2012  . Hyperglycemia 11/06/2011  . Tachycardia 10/17/2011  . Diabetes mellitus type 1, uncontrolled, with complications ([place]) 51/05/5850    Past Surgical History:  Procedure Laterality Date  . INCISION AND DRAINAGE PERIRECTAL ABSCESS Right 08/18/2013   Procedure: IRRIGATION AND DEBRIDEMENT GLUTEAL ABSCESS;  Surgeon: Ralene Ok, MD;  Location: [facility];  Service: General;  Laterality: Right;  . INCISION AND DRAINAGE PERIRECTAL ABSCESS Right 09/19/2013   Procedure: IRRIGATION AND DEBRIDEMENT RIGHT GLUTEAL AND LABIAL ABSCESSES;  Surgeon: Ralene Ok, MD;  Location: [facility];  Service: General;  Laterality: Right;  . INCISION AND DRAINAGE PERIRECTAL ABSCESS Right 09/24/2013   Procedure: IRRIGATION AND DEBRIDEMENT PERIRECTAL ABSCESS;  Surgeon: Gwenyth Ober, MD;  Location: [facility];  Service: General;  Laterality: Right;     OB History    Gravida  1   Para  0   Term  Preterm      AB  1   Living  0     SAB  1   TAB      Ectopic      Multiple      Live Births               Home Medications    Prior to Admission medications   Medication Sig Start Date End Date Taking? Authorizing Provider  blood glucose meter kit and supplies KIT Dispense based on patient and insurance  preference. Use up to four times daily as directed. (FOR ICD-9 250.00, 250.01). 05/25/17   Isla Pence, MD  HUMALOG KWIKPEN 100 UNIT/ML KiwkPen 0-12 units subcutaneously 3 times daily before meals as per sliding scale. MUST MAKE APPT FOR FURTHER REFILLS 01/09/18   Charlott Rakes, MD  ibuprofen (ADVIL,MOTRIN) 200 MG tablet Take 600 mg by mouth every 6 (six) hours as needed for headache or mild pain.    [provider]  Insulin Glargine (LANTUS) 100 UNIT/ML Solostar Pen Inject 48 Units into the skin daily at 10 pm. Patient taking differently: Inject 40 Units into the skin 2 (two) times daily.  08/12/17   Charlott Rakes, MD  metoCLOPramide (REGLAN) 10 MG tablet Take 1 tablet (10 mg total) by mouth every 6 (six) hours. 02/04/18   Keenan Bachelor, MD    Family History Family History  Problem Relation Age of Onset  . Asthma Mother   . Gout Father   . Diabetes Paternal Grandmother   . Anesthesia problems Neg Hx     Social History Social History   Tobacco Use  . Smoking status: Never Smoker  . Smokeless tobacco: Never Used  Substance Use Topics  . Alcohol use: Not Currently  . Drug use: No     Allergies   Penicillins; Benadryl [diphenhydramine]; and Doxycycline   Review of Systems Review of Systems  Gastrointestinal: Positive for abdominal pain and diarrhea.  All other systems reviewed and are negative.    Physical Exam Updated Vital Signs BP 93/67 (BP Location: Left Arm)   Pulse (!) 108   Temp (!) 97.5 F (36.4 C) (Oral)   Resp 17   Ht _0  (1.6 m)   Wt 53.5 kg   LMP 03/25/2018   SpO2 100%   BMI 20.90 kg/m   Physical Exam Vitals signs and nursing note reviewed.  Constitutional:      General: She is not in acute distress.    Appearance: She is well-developed.  HENT:     Head: Atraumatic.     Mouth/Throat:     Mouth: Mucous membranes are moist.  Eyes:     Conjunctiva/sclera: Conjunctivae normal.  Neck:     Musculoskeletal: Neck supple.    Cardiovascular:     Rate and Rhythm: Tachycardia present.     Heart sounds: Normal heart sounds.  Pulmonary:     Effort: Pulmonary effort is normal.     Breath sounds: Normal breath sounds.  Abdominal:     General: Abdomen is flat.     Tenderness: There is generalized abdominal tenderness.     Hernia: No hernia is present.  Skin:    Findings: No rash.  Neurological:     Mental Status: She is alert and oriented to person, place, and time.  Psychiatric:        Mood and Affect: Mood normal.      ED Treatments / Results  Labs (all labs ordered are listed, but only abnormal results  are displayed) Labs Reviewed  COMPREHENSIVE METABOLIC PANEL - Abnormal; Notable for the following components:      Result Value   Glucose, Bld 169 (*)    AST 67 (*)    All other components within normal limits  URINALYSIS, ROUTINE W REFLEX MICROSCOPIC - Abnormal; Notable for the following components:   APPearance CLOUDY (*)    Glucose, UA >=500 (*)    Hgb urine dipstick SMALL (*)    Leukocytes, UA TRACE (*)    All other components within normal limits  C DIFFICILE QUICK SCREEN W PCR REFLEX  LIPASE, BLOOD  CBC  I-STAT BETA HCG BLOOD, ED ([facility], [facility], [facility] ONLY)  CBG MONITORING, ED    EKG None  Radiology No results found.  Procedures Procedures (including critical care time)  Medications Ordered in ED Medications  sodium chloride 0.9 % bolus 1,000 mL (0 mLs Intravenous Stopped 04/12/18 1402)  ondansetron (ZOFRAN) injection 4 mg (4 mg Intravenous Given 04/12/18 1132)  morphine 4 MG/ML injection 4 mg (4 mg Intravenous Given 04/12/18 1236)  cefTRIAXone (ROCEPHIN) 1 g in sodium chloride 0.9 % 100 mL IVPB (0 g Intravenous Stopped 04/12/18 1402)     Initial Impression / Assessment and Plan / ED Course  I have reviewed the triage vital signs and the nursing notes.  Pertinent labs & imaging results that were available during my care of the patient were reviewed by me and considered in my  medical decision making (see chart for details).     BP 118/75   Pulse 95   Temp (!) 97.5 F (36.4 C) (Oral)   Resp 15   Ht _0  (1.6 m)   Wt 53.5 kg   LMP 03/25/2018   SpO2 99%   BMI 20.90 kg/m    Final Clinical Impressions(s) / ED Diagnoses   Final diagnoses:  Diarrhea, unspecified type  Lower urinary tract infectious disease    ED Discharge Orders         Ordered    nitrofurantoin, macrocrystal-monohydrate, (MACROBID) 100 MG capsule  2 times daily     04/12/18 1452         10:47 AM Patient with history of type 1 diabetes here with generalized abdominal discomfort as well as having persistent diarrhea ongoing for the past 3 weeks.  She also recently was on antibiotic a month ago for a week.  She report copious amount of diarrhea per day and along with abdominal pain, will check for C. difficile.  CBG ordered, work-up initiated, IV fluid given.  Patient otherwise well-appearing.  2:45 PM Pregnancy test is negative, normal lipase, electrolyte panels are reassuring, normal WBC, normal H&H, UA shows trace leukocyte esterase as well as 11-20 WBC.  Cloudy urine.  Budding yeast.  Initial concern was for seeing different cell however during this ED visit, patient did not have any bowel movement.  At this time, I felt the patient is stable for discharge.  No evidence of DKA.  Patient should return if her condition worsen.  I will prescribe Keflex antibiotic for UTI.  She will also receive 1 Diflucan 150 mg for her budding yeast finding in UA.     Domenic Moras, PA-C 04/12/18 1457    Pattricia Boss, MD 04/24/18 [phone]

## 2018-04-12 NOTE — Discharge Instructions (Signed)
Please follow up with your doctor for further evaluation of your recurrent diarrhea. Take macrobid as prescribed for urinary tract infection.  Return if you have any concerns.

## 2018-05-13 ENCOUNTER — Emergency Department (HOSPITAL_COMMUNITY)
Admission: EM | Admit: 2018-05-13 | Discharge: 2018-05-13 | Disposition: A | Discharge status: Home / Self Care | Payer: BLUE CROSS/BLUE SHIELD | Attending: Emergency Medicine | Admitting: Emergency Medicine

## 2018-05-13 ENCOUNTER — Encounter (HOSPITAL_COMMUNITY): Payer: Self-pay

## 2018-05-13 DIAGNOSIS — Z79899 Other long term (current) drug therapy: Secondary | ICD-10-CM | POA: Diagnosis not present

## 2018-05-13 DIAGNOSIS — E1040 Type 1 diabetes mellitus with diabetic neuropathy, unspecified: Secondary | ICD-10-CM | POA: Insufficient documentation

## 2018-05-13 DIAGNOSIS — E1065 Type 1 diabetes mellitus with hyperglycemia: Secondary | ICD-10-CM | POA: Diagnosis not present

## 2018-05-13 DIAGNOSIS — N3000 Acute cystitis without hematuria: Secondary | ICD-10-CM | POA: Insufficient documentation

## 2018-05-13 DIAGNOSIS — R739 Hyperglycemia, unspecified: Secondary | ICD-10-CM

## 2018-05-13 LAB — CBG MONITORING, ED
GLUCOSE-CAPILLARY: 495 mg/dL — AB (ref 70–99)
Glucose-Capillary: 538 mg/dL (ref 70–99)
Glucose-Capillary: 315 mg/dL — ABNORMAL HIGH (ref 70–99)
Glucose-Capillary: 88 mg/dL (ref 70–99)

## 2018-05-13 LAB — CBC
HEMATOCRIT: 43.2 % (ref 36.0–46.0)
HEMOGLOBIN: 13.8 g/dL (ref 12.0–15.0)
MCH: 26.2 pg (ref 26.0–34.0)
MCHC: 31.9 g/dL (ref 30.0–36.0)
MCV: 82.1 fL (ref 80.0–100.0)
Platelets: 361 10*3/uL (ref 150–400)
RBC: 5.26 MIL/uL — ABNORMAL HIGH (ref 3.87–5.11)
RDW: 12.9 % (ref 11.5–15.5)
WBC: 7.4 10*3/uL (ref 4.0–10.5)
nRBC: 0 % (ref 0.0–0.2)

## 2018-05-13 LAB — URINALYSIS, ROUTINE W REFLEX MICROSCOPIC
Bilirubin Urine: NEGATIVE
Glucose, UA: >=500 mg/dL — AB
Hgb urine dipstick: NEGATIVE
Ketones, ur: 20 mg/dL — AB
NITRITE: NEGATIVE
Protein, ur: NEGATIVE mg/dL
SPECIFIC GRAVITY, URINE: 1.028 (ref 1.005–1.030)
pH: 6 (ref 5.0–8.0)

## 2018-05-13 LAB — BASIC METABOLIC PANEL
Anion gap: 18 — ABNORMAL HIGH (ref 5–15)
Anion gap: 7 (ref 5–15)
BUN: 17 mg/dL (ref 6–20)
BUN: 13 mg/dL (ref 6–20)
CHLORIDE: 92 mmol/L — AB (ref 98–111)
CO2: 18 mmol/L — ABNORMAL LOW (ref 22–32)
CO2: 19 mmol/L — ABNORMAL LOW (ref 22–32)
CREATININE: 1.04 mg/dL — AB (ref 0.44–1.00)
Calcium: 9.6 mg/dL (ref 8.9–10.3)
Calcium: 7.8 mg/dL — ABNORMAL LOW (ref 8.9–10.3)
Chloride: 110 mmol/L (ref 98–111)
Creatinine, Ser: 0.7 mg/dL (ref 0.44–1.00)
GFR calc Af Amer: >60 mL/min (ref >60)
GFR calc non Af Amer: >60 mL/min (ref >60)
Glucose, Bld: 564 mg/dL (ref 70–99)
Glucose, Bld: 217 mg/dL — ABNORMAL HIGH (ref 70–99)
POTASSIUM: 4.2 mmol/L (ref 3.5–5.1)
Potassium: 3.5 mmol/L (ref 3.5–5.1)
SODIUM: 128 mmol/L — AB (ref 135–145)
Sodium: 136 mmol/L (ref 135–145)

## 2018-05-13 LAB — I-STAT BETA HCG BLOOD, ED (MC, WL, AP ONLY)

## 2018-05-13 MED ORDER — SODIUM CHLORIDE 0.9 % IV BOLUS
3000.0000 mL | Freq: Once | INTRAVENOUS | Status: AC
  Administered 2018-05-13: 3000 mL via INTRAVENOUS

## 2018-05-13 MED ORDER — INSULIN GLARGINE 100 UNIT/ML ~~LOC~~ SOLN
20.0000 [IU] | Freq: Once | SUBCUTANEOUS | Status: AC
  Administered 2018-05-13: 20 [IU] via SUBCUTANEOUS
  Filled 2018-05-13: qty 0.2

## 2018-05-13 MED ORDER — INSULIN ASPART 100 UNIT/ML ~~LOC~~ SOLN
10.0000 [IU] | Freq: Once | SUBCUTANEOUS | Status: AC
  Administered 2018-05-13: 10 [IU] via SUBCUTANEOUS
  Filled 2018-05-13: qty 1

## 2018-05-13 MED ORDER — SODIUM CHLORIDE 0.9 % IV SOLN: INTRAVENOUS | Status: DC

## 2018-05-13 MED ORDER — INSULIN ASPART 100 UNIT/ML ~~LOC~~ SOLN
3.0000 [IU] | Freq: Once | SUBCUTANEOUS | Status: AC
  Administered 2018-05-13: 3 [IU] via SUBCUTANEOUS
  Filled 2018-05-13: qty 1

## 2018-05-13 MED ORDER — NITROFURANTOIN MONOHYD MACRO 100 MG PO CAPS: 100.0000 mg | ORAL_CAPSULE | Freq: Two times a day (BID) | ORAL | 0 refills | Status: AC

## 2018-05-13 NOTE — ED Provider Notes (Signed)
28 year old female here with generalized fatigue and signs of very early, mild DKA initially on lab work.  Per Dr. Zenia Resides, decision made to give fluids, IV insulin here, and recheck.  On recheck of lab work, patient's anion gap is now 7.  Her bicarb is improving.  She has a blood sugar of 217.  She had only 20 ketones on her urinalysis, and I suspect this is also partially contributed to by dehydration.  She has been given multiple bags of fluid.  She feels markedly improved.  I had a long discussion with the patient regarding further management.  She currently works and would like to attempt outpatient management.  Given that her anion gap is completely closed she is well-appearing, and otherwise stable, I think this is reasonable, although I discussed that she will need strict adherence with her insulin regimen as well as absolutely no missed doses and good adherence with diabetic diet.  Based on shared decision making, she was given a dose of Lantus here, carb coverage for a small meal to help continue metabolism of her ketones, and she will follow-up with her PCP.  Return precautions were given in detail.   Duffy Bruce, MD 05/13/18 2040

## 2018-05-13 NOTE — ED Triage Notes (Signed)
Pt presents with c/o hyperglycemia. Pt reports she has been reading "high" on her monitor for the past 3 days. Pt reports she feels sleepy.

## 2018-05-13 NOTE — ED Notes (Signed)
Date and time results received: 05/13/18 1:49 PM  (use smartphrase ".now" to insert current time)  Test: Glucose Critical Value: 564  Name of Provider Notified: Zenia Resides  Orders Received? Or Actions Taken?: awaiting orders

## 2018-05-13 NOTE — ED Provider Notes (Signed)
[facility] DEPT Provider Note   CSN: [id number] Arrival date & time: 05/13/18  1150     History   Chief Complaint Chief Complaint  Patient presents with  . Hyperglycemia    HPI Ashley Freeman is a 28 y.o. female.  28 year old female with history of type 1 diabetes presents with polyuria and polydipsia along with increased blood sugars at home.  Sugars at home have been over 500.  History of DKA and this is similar.  States that she has been noncompliant with a diabetic diet.  Has been compliant with her insulin at times but states recently she has been taking it later than she should.  Denies any recent history of illness.  No fever or chills.  Has nausea with some emesis.  The pain makes her symptoms better.     Past Medical History:  Diagnosis Date  . Diabetes mellitus 2001   Diagnosed at age 87 ; Type I  . Diarrhea 05/30/2016  . DKA (diabetic ketoacidoses) ([place]) 08/19/2013  . DKA (diabetic ketoacidoses) ([place]) 05/30/2016  . Gonorrhea 08/2011   Treated in 09/2011    Patient Active Problem List   Diagnosis Date Noted  . DKA (diabetic ketoacidosis) ([place]) 12/29/2016  . Inguinal abscess   . History of trichomoniasis 05/31/2016  . Diarrhea 05/31/2016  . DKA (diabetic ketoacidoses) ([place]) 05/30/2016  . Hyperlipidemia 03/28/2016  . Diabetic neuropathy ([place]) 01/16/2016  . DKA, type 1 ([place]) 11/24/2015  . BV (bacterial vaginosis) 11/24/2015  . Chest pain 11/24/2015  . AKI (acute kidney injury) ([place]) 07/26/2014  . Infection of urinary tract 04/08/2014  . PID (acute pelvic inflammatory disease) 04/06/2014  . Diabetic ketoacidosis without coma associated with type 1 diabetes mellitus ([place]) 09/19/2013  . Infected cyst of Bartholin's gland duct 09/19/2013  . Bartholin's gland abscess 09/19/2013  . Sepsis ([place]) 09/19/2013  . Abscess, gluteal, right 08/24/2013  . Health care maintenance 08/24/2013  . Hypophosphatemia 08/18/2013  .  Hypomagnesemia 08/18/2013  . Leukocytosis, unspecified 08/17/2013  . Fever, unspecified 08/17/2013  . H/O medication noncompliance 08/12/2013  . Hyponatremia 08/04/2012  . Anemia 02/19/2012  . Hyperglycemia 11/06/2011  . Tachycardia 10/17/2011  . Diabetes mellitus type 1, uncontrolled, with complications ([place]) 69/62/9528    Past Surgical History:  Procedure Laterality Date  . INCISION AND DRAINAGE PERIRECTAL ABSCESS Right 08/18/2013   Procedure: IRRIGATION AND DEBRIDEMENT GLUTEAL ABSCESS;  Surgeon: Ralene Ok, MD;  Location: [facility];  Service: General;  Laterality: Right;  . INCISION AND DRAINAGE PERIRECTAL ABSCESS Right 09/19/2013   Procedure: IRRIGATION AND DEBRIDEMENT RIGHT GLUTEAL AND LABIAL ABSCESSES;  Surgeon: Ralene Ok, MD;  Location: [facility];  Service: General;  Laterality: Right;  . INCISION AND DRAINAGE PERIRECTAL ABSCESS Right 09/24/2013   Procedure: IRRIGATION AND DEBRIDEMENT PERIRECTAL ABSCESS;  Surgeon: Gwenyth Ober, MD;  Location: [facility];  Service: General;  Laterality: Right;     OB History    Gravida  1   Para  0   Term      Preterm      AB  1   Living  0     SAB  1   TAB      Ectopic      Multiple      Live Births               Home Medications    Prior to Admission medications   Medication Sig Start Date End Date Taking? Authorizing Provider  HUMALOG KWIKPEN 100 UNIT/ML  KiwkPen 0-12 units subcutaneously 3 times daily before meals as per sliding scale. MUST MAKE APPT FOR FURTHER REFILLS Patient taking differently: Inject 0-12 Units into the skin 3 (three) times daily before meals. Per sliding scale 01/09/18  Yes Charlott Rakes, MD  ibuprofen (ADVIL,MOTRIN) 200 MG tablet Take 400 mg by mouth daily as needed (pain).   Yes [provider]  Insulin Glargine (LANTUS) 100 UNIT/ML Solostar Pen Inject 48 Units into the skin daily at 10 pm. Patient taking differently: Inject 40 Units into the skin 2 (two) times daily.  08/12/17  Yes  Charlott Rakes, MD  blood glucose meter kit and supplies KIT Dispense based on patient and insurance preference. Use up to four times daily as directed. (FOR ICD-9 250.00, 250.01). 05/25/17   Isla Pence, MD  metoCLOPramide (REGLAN) 10 MG tablet Take 1 tablet (10 mg total) by mouth every 6 (six) hours. Patient not taking: Reported on 04/12/2018 02/04/18   Keenan Bachelor, MD  nitrofurantoin, macrocrystal-monohydrate, (MACROBID) 100 MG capsule Take 1 capsule (100 mg total) by mouth 2 (two) times daily. X 7 days Patient not taking: Reported on 05/13/2018 04/12/18   Domenic Moras, PA-C    Family History Family History  Problem Relation Age of Onset  . Asthma Mother   . Gout Father   . Diabetes Paternal Grandmother   . Anesthesia problems Neg Hx     Social History Social History   Tobacco Use  . Smoking status: Never Smoker  . Smokeless tobacco: Never Used  Substance Use Topics  . Alcohol use: Not Currently  . Drug use: No     Allergies   Penicillins; Benadryl [diphenhydramine]; and Doxycycline   Review of Systems Review of Systems  All other systems reviewed and are negative.    Physical Exam Updated Vital Signs BP 120/81   Pulse (!) 103   Temp (!) 97.5 F (36.4 C) (Oral)   Resp 18   LMP 04/28/2018 (Approximate)   SpO2 98%   Physical Exam Vitals signs and nursing note reviewed.  Constitutional:      General: She is not in acute distress.    Appearance: Normal appearance. She is well-developed. She is not toxic-appearing.  HENT:     Head: Normocephalic and atraumatic.  Eyes:     General: Lids are normal.     Conjunctiva/sclera: Conjunctivae normal.     Pupils: Pupils are equal, round, and reactive to light.  Neck:     Musculoskeletal: Normal range of motion and neck supple.     Thyroid: No thyroid mass.     Trachea: No tracheal deviation.  Cardiovascular:     Rate and Rhythm: Regular rhythm. Tachycardia present.     Heart sounds: Normal heart sounds. No  murmur. No gallop.   Pulmonary:     Effort: Pulmonary effort is normal. No respiratory distress.     Breath sounds: Normal breath sounds. No stridor. No decreased breath sounds, wheezing, rhonchi or rales.  Abdominal:     General: Bowel sounds are normal. There is no distension.     Palpations: Abdomen is soft.     Tenderness: There is no abdominal tenderness. There is no rebound.  Musculoskeletal: Normal range of motion.        General: No tenderness.  Skin:    General: Skin is warm and dry.     Findings: No abrasion or rash.  Neurological:     Mental Status: She is alert and oriented to person, place, and time.  GCS: GCS eye subscore is 4. GCS verbal subscore is 5. GCS motor subscore is 6.     Cranial Nerves: No cranial nerve deficit.     Sensory: No sensory deficit.  Psychiatric:        Attention and Perception: Attention normal.        Speech: Speech normal.        Behavior: Behavior normal.      ED Treatments / Results  Labs (all labs ordered are listed, but only abnormal results are displayed) Labs Reviewed  CBG MONITORING, ED - Abnormal; Notable for the following components:      Result Value   Glucose-Capillary 495 (*)    All other components within normal limits  CBG MONITORING, ED - Abnormal; Notable for the following components:   Glucose-Capillary 538 (*)    All other components within normal limits  BASIC METABOLIC PANEL  CBC  URINALYSIS, ROUTINE W REFLEX MICROSCOPIC  BLOOD GAS, VENOUS  I-STAT BETA HCG BLOOD, ED ([facility], [facility], [facility] ONLY)  I-STAT BETA HCG BLOOD, ED ([facility], [facility], [facility] ONLY)    EKG EKG Interpretation  Date/Time:  Tuesday May 13 2018 12:44:51 EST Ventricular Rate:  101 PR Interval:    QRS Duration: 75 QT Interval:  347 QTC Calculation: 450 R Axis:   67 Text Interpretation:  Sinus tachycardia Probable left atrial enlargement Confirmed by Lacretia Leigh ([id number]) on 05/13/2018 12:55:59 PM   Radiology No results found.  Procedures Procedures  (including critical care time)  Medications Ordered in ED Medications  sodium chloride 0.9 % bolus 3,000 mL (has no administration in time range)  0.9 %  sodium chloride infusion (has no administration in time range)     Initial Impression / Assessment and Plan / ED Course  I have reviewed the triage vital signs and the nursing notes.  Pertinent labs & imaging results that were available during my care of the patient were reviewed by me and considered in my medical decision making (see chart for details).    Patient has blood sugar noted as well as her anion gap. Patient given 3 L of saline here and blood sugar has improved.  Patient also given insulin 2 units subcu.  Patient to have repeat bmet and if Anion gap is continue to improve will be discharged.  Final Clinical Impressions(s) / ED Diagnoses   Final diagnoses:  None    ED Discharge Orders    None       Lacretia Leigh, MD 05/13/18 1542

## 2018-06-04 ENCOUNTER — Encounter: Payer: Self-pay | Admitting: Family Medicine

## 2018-06-04 ENCOUNTER — Other Ambulatory Visit (HOSPITAL_COMMUNITY)
Admission: RE | Admit: 2018-06-04 | Discharge: 2018-06-04 | Disposition: A | Discharge status: Home / Self Care | Payer: BLUE CROSS/BLUE SHIELD | Source: Ambulatory Visit | Attending: Family Medicine | Admitting: Family Medicine

## 2018-06-04 ENCOUNTER — Ambulatory Visit (HOSPITAL_BASED_OUTPATIENT_CLINIC_OR_DEPARTMENT_OTHER): Payer: BLUE CROSS/BLUE SHIELD | Admitting: Family Medicine

## 2018-06-04 VITALS — BP 136/92 | HR 109 | Temp 98.3°F | Resp 16 | Wt 135.6 lb

## 2018-06-04 DIAGNOSIS — R197 Diarrhea, unspecified: Secondary | ICD-10-CM | POA: Diagnosis not present

## 2018-06-04 DIAGNOSIS — E108 Type 1 diabetes mellitus with unspecified complications: Secondary | ICD-10-CM | POA: Diagnosis present

## 2018-06-04 DIAGNOSIS — M545 Low back pain, unspecified: Secondary | ICD-10-CM

## 2018-06-04 DIAGNOSIS — IMO0002 Reserved for concepts with insufficient information to code with codable children: Secondary | ICD-10-CM

## 2018-06-04 DIAGNOSIS — E1065 Type 1 diabetes mellitus with hyperglycemia: Secondary | ICD-10-CM

## 2018-06-04 DIAGNOSIS — Z113 Encounter for screening for infections with a predominantly sexual mode of transmission: Secondary | ICD-10-CM

## 2018-06-04 LAB — POCT URINALYSIS DIP (CLINITEK)
Bilirubin, UA: NEGATIVE
Glucose, UA: 500 mg/dL — AB
Ketones, POC UA: NEGATIVE mg/dL
Leukocytes, UA: NEGATIVE
Nitrite, UA: NEGATIVE
POC PROTEIN,UA: 30 — AB
Spec Grav, UA: 1.025 (ref 1.010–1.025)
Urobilinogen, UA: 0.2 E.U./dL
pH, UA: 6 (ref 5.0–8.0)

## 2018-06-04 LAB — GLUCOSE, POCT (MANUAL RESULT ENTRY): POC Glucose: 240 mg/dl — AB (ref 70–99)

## 2018-06-04 LAB — POCT GLYCOSYLATED HEMOGLOBIN (HGB A1C)

## 2018-06-04 MED ORDER — MELOXICAM 7.5 MG PO TABS: 7.5000 mg | ORAL_TABLET | Freq: Every day | ORAL | 1 refills | Status: DC

## 2018-06-04 MED ORDER — INSULIN GLARGINE 100 UNIT/ML SOLOSTAR PEN: 25.0000 [IU] | PEN_INJECTOR | Freq: Two times a day (BID) | SUBCUTANEOUS | 6 refills | Status: DC

## 2018-06-04 MED ORDER — CYCLOBENZAPRINE HCL 10 MG PO TABS: 10.0000 mg | ORAL_TABLET | Freq: Three times a day (TID) | ORAL | 0 refills | Status: DC | PRN

## 2018-06-04 MED ORDER — METOCLOPRAMIDE HCL 10 MG PO TABS: 10.0000 mg | ORAL_TABLET | Freq: Three times a day (TID) | ORAL | 1 refills | Status: DC

## 2018-06-04 NOTE — Patient Instructions (Signed)
Acute Back Pain, Adult  Acute back pain is sudden and usually short-lived. It is often caused by an injury to the muscles and tissues in the back. The injury may result from:   A muscle or ligament getting overstretched or torn (strained). Ligaments are tissues that connect bones to each other. Lifting something improperly can cause a back strain.   Wear and tear (degeneration) of the spinal disks. Spinal disks are circular tissue that provides cushioning between the bones of the spine (vertebrae).   Twisting motions, such as while playing sports or doing yard work.   A hit to the back.   Arthritis.  You may have a physical exam, lab tests, and imaging tests to find the cause of your pain. Acute back pain usually goes away with rest and home care.  Follow these instructions at home:  Managing pain, stiffness, and swelling   Take over-the-counter and prescription medicines only as told by your health care provider.   Your health care provider may recommend applying ice during the first 24-48 hours after your pain starts. To do this:  ? Put ice in a plastic bag.  ? Place a towel between your skin and the bag.  ? Leave the ice on for 20 minutes, 2-3 times a day.   If directed, apply heat to the affected area as often as told by your health care provider. Use the heat source that your health care provider recommends, such as a moist heat pack or a heating pad.  ? Place a towel between your skin and the heat source.  ? Leave the heat on for 20-30 minutes.  ? Remove the heat if your skin turns bright red. This is especially important if you are unable to feel pain, heat, or cold. You have a greater risk of getting burned.  Activity     Do not stay in bed. Staying in bed for more than 1-2 days can delay your recovery.   Sit up and stand up straight. Avoid leaning forward when you sit, or hunching over when you stand.  ? If you work at a desk, sit close to it so you do not need to lean over. Keep your chin tucked  in. Keep your neck drawn back, and keep your elbows bent at a right angle. Your arms should look like the letter "L."  ? Sit high and close to the steering wheel when you drive. Add lower back (lumbar) support to your car seat, if needed.   Take short walks on even surfaces as soon as you are able. Try to increase the length of time you walk each day.   Do not sit, drive, or stand in one place for more than 30 minutes at a time. Sitting or standing for long periods of time can put stress on your back.   Do not drive or use heavy machinery while taking prescription pain medicine.   Use proper lifting techniques. When you bend and lift, use positions that put less stress on your back:  ? Bend your knees.  ? Keep the load close to your body.  ? Avoid twisting.   Exercise regularly as told by your health care provider. Exercising helps your back heal faster and helps prevent back injuries by keeping muscles strong and flexible.   Work with a physical therapist to make a safe exercise program, as recommended by your health care provider. Do any exercises as told by your physical therapist.  Lifestyle   Maintain   a healthy weight. Extra weight puts stress on your back and makes it difficult to have good posture.   Avoid activities or situations that make you feel anxious or stressed. Stress and anxiety increase muscle tension and can make back pain worse. Learn ways to manage anxiety and stress, such as through exercise.  General instructions   Sleep on a firm mattress in a comfortable position. Try lying on your side with your knees slightly bent. If you lie on your back, put a pillow under your knees.   Follow your treatment plan as told by your health care provider. This may include:  ? Cognitive or behavioral therapy.  ? Acupuncture or massage therapy.  ? Meditation or yoga.  Contact a health care provider if:   You have pain that is not relieved with rest or medicine.   You have increasing pain going down  into your legs or buttocks.   Your pain does not improve after 2 weeks.   You have pain at night.   You lose weight without trying.   You have a fever or chills.  Get help right away if:   You develop new bowel or bladder control problems.   You have unusual weakness or numbness in your arms or legs.   You develop nausea or vomiting.   You develop abdominal pain.   You feel faint.  Summary   Acute back pain is sudden and usually short-lived.   Use proper lifting techniques. When you bend and lift, use positions that put less stress on your back.   Take over-the-counter and prescription medicines and apply heat or ice as directed by your health care provider.  This information is not intended to replace advice given to you by your health care provider. Make sure you discuss any questions you have with your health care provider.  Document Released: 04/16/2005 Document Revised: 11/21/2017 Document Reviewed: 11/28/2016  Elsevier Interactive Patient Education  2019 [employer] Inc[employer]

## 2018-06-04 NOTE — Progress Notes (Signed)
Pt states she is having lower back pain

## 2018-06-05 LAB — URINE CYTOLOGY ANCILLARY ONLY
Chlamydia: POSITIVE — AB
Neisseria Gonorrhea: NEGATIVE
Trichomonas: NEGATIVE

## 2018-06-05 LAB — MICROALBUMIN / CREATININE URINE RATIO
Creatinine, Urine: 81 mg/dL
Microalb/Creat Ratio: 101 mg/g creat — ABNORMAL HIGH (ref 0–29)
Microalbumin, Urine: 81.6 ug/mL

## 2018-06-05 MED ORDER — LISINOPRIL 2.5 MG PO TABS: 2.5000 mg | ORAL_TABLET | Freq: Every day | ORAL | 3 refills | Status: DC

## 2018-06-05 NOTE — Progress Notes (Signed)
Subjective:  Patient ID: Ashley Freeman, female    DOB: 24-Aug-1990  Age: 28 y.o. MRN: [id number]  CC: Diabetes   HPI Ashley Freeman is a 28 year old female with a history of type 1 diabetes mellitus (A1c>15.5), diabetic neuropathy presents today for follow-up visit. She has been administering 22 units of Lantus twice daily rather than 48 units once daily at night due to hypoglycemia.  She does have cataracts which were discovered at her office visit with ophthalmologisy-Dr. Gershon Crane last month but was told her glycemic control will need to improve prior to surgery.  Denies numbness in extremities. Logs reveal random sugars anywhere from 159-456. She endorses diarrhea which occurs right after she eats and this has been present for a while.  Does have intermittent abdominal cramping but denies burning, reflux. She complains of low back pain for the last 2 weeks which is midline and does not radiate, described as moderate and there are no relieving or aggravating factors.  Denies paresthesias in lower extremities, falls, loss of sphincteric functions.  Past Medical History:  Diagnosis Date  . Diabetes mellitus 2001   Diagnosed at age 71 ; Type I  . Diarrhea 05/30/2016  . DKA (diabetic ketoacidoses) ([place]) 08/19/2013  . DKA (diabetic ketoacidoses) ([place]) 05/30/2016  . Gonorrhea 08/2011   Treated in 09/2011    Past Surgical History:  Procedure Laterality Date  . INCISION AND DRAINAGE PERIRECTAL ABSCESS Right 08/18/2013   Procedure: IRRIGATION AND DEBRIDEMENT GLUTEAL ABSCESS;  Surgeon: Ralene Ok, MD;  Location: [facility];  Service: General;  Laterality: Right;  . INCISION AND DRAINAGE PERIRECTAL ABSCESS Right 09/19/2013   Procedure: IRRIGATION AND DEBRIDEMENT RIGHT GLUTEAL AND LABIAL ABSCESSES;  Surgeon: Ralene Ok, MD;  Location: [facility];  Service: General;  Laterality: Right;  . INCISION AND DRAINAGE PERIRECTAL ABSCESS Right 09/24/2013   Procedure: IRRIGATION AND DEBRIDEMENT  PERIRECTAL ABSCESS;  Surgeon: Gwenyth Ober, MD;  Location: [facility];  Service: General;  Laterality: Right;    Family History  Problem Relation Age of Onset  . Asthma Mother   . Gout Father   . Diabetes Paternal Grandmother   . Anesthesia problems Neg Hx     Allergies  Allergen Reactions  . Penicillins Hives and Rash    Has patient had a PCN reaction causing immediate rash, facial/tongue/throat swelling, SOB or lightheadedness with hypotension: Yes Has patient had a PCN reaction causing severe rash involving mucus membranes or skin necrosis: No Has patient had a PCN reaction that required hospitalization: Yes Has patient had a PCN reaction occurring within the last 10 years: No If all of the above answers are "NO", then may proceed with Cephalosporin use.  . Benadryl [Diphenhydramine] Itching  . Doxycycline Itching    Outpatient Medications Prior to Visit  Medication Sig Dispense Refill  . blood glucose meter kit and supplies KIT Dispense based on patient and insurance preference. Use up to four times daily as directed. (FOR ICD-9 250.00, 250.01). 1 each 0  . HUMALOG KWIKPEN 100 UNIT/ML KiwkPen 0-12 units subcutaneously 3 times daily before meals as per sliding scale. MUST MAKE APPT FOR FURTHER REFILLS (Patient taking differently: Inject 0-12 Units into the skin 3 (three) times daily before meals. Per sliding scale) 15 mL 0  . ibuprofen (ADVIL,MOTRIN) 200 MG tablet Take 400 mg by mouth daily as needed (pain).    . Insulin Glargine (LANTUS) 100 UNIT/ML Solostar Pen Inject 48 Units into the skin daily at 10 pm. (Patient taking differently: Inject  40 Units into the skin 2 (two) times daily. ) 15 mL 11  . metoCLOPramide (REGLAN) 10 MG tablet Take 1 tablet (10 mg total) by mouth every 6 (six) hours. (Patient not taking: Reported on 04/12/2018) 30 tablet 0   Facility-Administered Medications Prior to Visit  Medication Dose Route Frequency Provider Last Rate Last Dose  . Insulin Pen Needle  (NOVOFINE) 200 each  20 packet Subcutaneous PRN Allie Bossier, MD         ROS Review of Systems General: negative for fever, weight loss, appetite change Eyes: no visual symptoms. ENT: no ear symptoms, no sinus tenderness, no nasal congestion or sore throat. Neck: no pain  Respiratory: no wheezing, shortness of breath, cough Cardiovascular: no chest pain, no dyspnea on exertion, no pedal edema, no orthopnea. Gastrointestinal: no abdominal pain, +diarrhea, no constipation Genito-Urinary: no urinary frequency, no dysuria, no polyuria. Hematologic: no bruising Endocrine: no cold or heat intolerance Neurological: no headaches, no seizures, no tremors Musculoskeletal: no joint pains, no joint swelling, + backpain Skin: no pruritus, no rash. Psychological: no depression, no anxiety,    Objective:  BP (!) 136/92   Pulse (!) 109   Temp 98.3 F (36.8 C) (Oral)   Resp 16   Wt 135 lb 9.6 oz (61.5 kg)   SpO2 98%   BMI 24.02 kg/m   BP/Weight 06/04/2018 05/13/2018 41/74/0814  Systolic BP 481 856 314  Diastolic BP 92 76 72  Wt. (Lbs) 135.6 - 118  BMI 24.02 - 20.9      Physical Exam Constitutional: normal appearing,  Eyes: PERRLA HEENT: Head is atraumatic, normal sinuses, normal oropharynx, normal appearing tonsils and palate, tympanic membrane is normal bilaterally. Neck: normal range of motion, no thyromegaly, no JVD Cardiovascular: tachycardicrate and rhythm, normal heart sounds, no murmurs, rub or gallop, no pedal edema Respiratory: Normal breath sounds, clear to auscultation bilaterally, no wheezes, no rales, no rhonchi Abdomen: soft, not tender to palpation, normal bowel sounds, no enlarged organs Musculoskeletal: Full ROM, no tenderness in joints Skin: warm and dry, no lesions. Neurological: alert, oriented x3, cranial nerves I-XII grossly intact , normal motor strength, normal sensation. Psychological: normal mood. Sensory exam of the foot is normal, tested with the  monofilament. Good pulses, no lesions or ulcers, good peripheral pulses.  CMP Latest Ref Rng & Units 05/13/2018 05/13/2018 04/12/2018  Glucose 70 - 99 mg/dL 217(H) 564(HH) 169(H)  BUN 6 - 20 mg/dL _0 Creatinine 0.44 - 1.00 mg/dL 0.70 1.04(H) 0.68  Sodium 135 - 145 mmol/L 136 128(L) 136  Potassium 3.5 - 5.1 mmol/L 3.5 4.2 3.5  Chloride 98 - 111 mmol/L 110 92(L) 103  CO2 22 - 32 mmol/L 19(L) 18(L) 23  Calcium 8.9 - 10.3 mg/dL 7.8(L) 9.6 9.3  Total Protein 6.5 - 8.1 g/dL - - 7.1  Total Bilirubin 0.3 - 1.2 mg/dL - - 0.5  Alkaline Phos 38 - 126 U/L - - 66  AST 15 - 41 U/L - - 67(H)  ALT 0 - 44 U/L - - 36    Lipid Panel     Component Value Date/Time   CHOL 272 (H) 03/27/2016 0917   TRIG 163 (H) 03/27/2016 0917   HDL 47 (L) 03/27/2016 0917   CHOLHDL 5.8 (H) 03/27/2016 0917   VLDL 33 (H) 03/27/2016 0917   LDLCALC 192 (H) 03/27/2016 0917    CBC    Component Value Date/Time   WBC 7.4 05/13/2018 1304   RBC 5.26 (H) 05/13/2018 1304  HGB 13.8 05/13/2018 1304   HGB 12.8 11/11/2013 0431   HCT 43.2 05/13/2018 1304   HCT 40.2 11/11/2013 0431   PLT 361 05/13/2018 1304   PLT 272 11/11/2013 0431   MCV 82.1 05/13/2018 1304   MCV 80 11/11/2013 0431   MCH 26.2 05/13/2018 1304   MCHC 31.9 05/13/2018 1304   RDW 12.9 05/13/2018 1304   RDW 14.8 (H) 11/11/2013 0431   LYMPHSABS 3.0 10/14/2016 0723   LYMPHSABS 3.7 (H) 11/11/2013 0431   MONOABS 1.1 (H) 10/14/2016 0723   MONOABS 0.5 11/11/2013 0431   EOSABS 0.1 10/14/2016 0723   EOSABS 0.1 11/11/2013 0431   BASOSABS 0.0 10/14/2016 0723   BASOSABS 0.1 11/11/2013 0431    Lab Results  Component Value Date   HGBA1C  06/04/2018     Comment:     >15    Assessment & Plan:   1. Diabetes mellitus type 1, uncontrolled, with complications ([place]) Uncontrolled with A1c of greater than 15 She has been self adjusting insulin due to hypoglycemia We will split Lantus into twice daily dosing Continue short acting insulin with meals Counseled  on Diabetic diet, my plate method, 117 minutes of moderate intensity exercise/week Keep blood sugar logs with fasting goals of 80-120 mg/dl, random of less than 180 and in the event of sugars less than 60 mg/dl or greater than 400 mg/dl please notify the clinic ASAP. It is recommended that you undergo annual eye exams and annual foot exams. Pneumonia vaccine is recommended. - POCT glucose (manual entry) - POCT glycosylated hemoglobin (Hb A1C) - Microalbumin/Creatinine Ratio, Urine - Insulin Glargine (LANTUS) 100 UNIT/ML Solostar Pen; Inject 25 Units into the skin 2 (two) times daily.  Dispense: 30 mL; Refill: 6 - metoCLOPramide (REGLAN) 10 MG tablet; Take 1 tablet (10 mg total) by mouth 3 (three) times daily before meals.  Dispense: 60 tablet; Refill: 1 - Ambulatory referral to Endocrinology - POCT URINALYSIS DIP (CLINITEK)  2. Acute midline low back pain without sciatica For muscle spasm Apply heat - cyclobenzaprine (FLEXERIL) 10 MG tablet; Take 1 tablet (10 mg total) by mouth 3 (three) times daily as needed for muscle spasms.  Dispense: 30 tablet; Refill: 0 - meloxicam (MOBIC) 7.5 MG tablet; Take 1 tablet (7.5 mg total) by mouth daily.  Dispense: 30 tablet; Refill: 1  3. Screening for STD (sexually transmitted disease) Safe sexual practices - Urine cytology ancillary only  4. Diarrhea, unspecified type To exclude pancreatic insufficiency - Pancreatic Elastase, Fecal; Future   Meds ordered this encounter  Medications  . Insulin Glargine (LANTUS) 100 UNIT/ML Solostar Pen    Sig: Inject 25 Units into the skin 2 (two) times daily.    Dispense:  30 mL    Refill:  6  . metoCLOPramide (REGLAN) 10 MG tablet    Sig: Take 1 tablet (10 mg total) by mouth 3 (three) times daily before meals.    Dispense:  60 tablet    Refill:  1  . cyclobenzaprine (FLEXERIL) 10 MG tablet    Sig: Take 1 tablet (10 mg total) by mouth 3 (three) times daily as needed for muscle spasms.    Dispense:  30 tablet     Refill:  0  . meloxicam (MOBIC) 7.5 MG tablet    Sig: Take 1 tablet (7.5 mg total) by mouth daily.    Dispense:  30 tablet    Refill:  1    Follow-up: Return in about 1 month (around 07/03/2018) for Complete physical exam.  Charlott Rakes MD    Charlott Rakes, MD, FAAFP. [facility] [place], [place]   06/05/2018, 11:00 AM

## 2018-06-06 ENCOUNTER — Telehealth: Payer: Self-pay

## 2018-06-06 ENCOUNTER — Other Ambulatory Visit: Payer: Self-pay | Admitting: Family Medicine

## 2018-06-06 LAB — URINE CYTOLOGY ANCILLARY ONLY: Candida vaginitis: NEGATIVE

## 2018-06-06 MED ORDER — AZITHROMYCIN 250 MG PO TABS: ORAL_TABLET | ORAL | 0 refills | Status: DC

## 2018-06-06 NOTE — Telephone Encounter (Signed)
-----   Message from Charlott Rakes, MD sent at 06/06/2018  2:59 PM EST ----- She tested positive for Chlamydia which is an std and I have sent azithromycin to her pharmacy. She will need a retest in 3 months. Her partner needs to be treated as well. Please complete reporting form for the health dept. Thanks

## 2018-06-06 NOTE — Telephone Encounter (Signed)
Patient was called and informed of lab results and medication being sent to pharmacy. Paperwork has been sent to health department.

## 2018-06-09 ENCOUNTER — Other Ambulatory Visit: Payer: Self-pay | Admitting: Family Medicine

## 2018-06-09 MED ORDER — METRONIDAZOLE 0.75 % VA GEL: 1.0000 | Freq: Every day | VAGINAL | 0 refills | Status: DC

## 2018-06-20 LAB — BLOOD GAS, VENOUS
Acid-Base Excess: 5.7 mmol/L — ABNORMAL HIGH (ref 0.0–2.0)
Bicarbonate: 20.4 mmol/L (ref 20.0–28.0)
O2 Saturation: 64.2 %
PCO2 VEN: 43.9 mmHg — AB (ref 44.0–60.0)
pH, Ven: 7.289 (ref 7.250–7.430)
pO2, Ven: 34.6 mmHg (ref 32.0–45.0)

## 2018-06-24 ENCOUNTER — Ambulatory Visit: Payer: BLUE CROSS/BLUE SHIELD | Admitting: Internal Medicine

## 2018-06-27 ENCOUNTER — Encounter: Payer: Self-pay | Admitting: Internal Medicine

## 2018-06-27 ENCOUNTER — Ambulatory Visit: Payer: BLUE CROSS/BLUE SHIELD | Admitting: Internal Medicine

## 2018-06-27 VITALS — BP 118/86 | HR 118 | Ht 63.0 in | Wt 139.0 lb

## 2018-06-27 DIAGNOSIS — R000 Tachycardia, unspecified: Secondary | ICD-10-CM | POA: Diagnosis not present

## 2018-06-27 DIAGNOSIS — E1065 Type 1 diabetes mellitus with hyperglycemia: Secondary | ICD-10-CM

## 2018-06-27 MED ORDER — HUMALOG KWIKPEN 100 UNIT/ML ~~LOC~~ SOPN: 6.0000 [IU] | PEN_INJECTOR | Freq: Three times a day (TID) | SUBCUTANEOUS | 0 refills | Status: DC

## 2018-06-27 MED ORDER — INSULIN GLARGINE 100 UNIT/ML SOLOSTAR PEN: 18.0000 [IU] | PEN_INJECTOR | Freq: Every day | SUBCUTANEOUS | 6 refills | Status: DC

## 2018-06-27 NOTE — Patient Instructions (Addendum)
-   Decrease Lantus to 18 units daily  - Start Humalog at 6 units with Each meal - If your before meal sugar is over 200 , add 2 more units of humalog to that meal    - Check sugar before you eats, give your self humalog then eat - Continue checking sugar at bedtime     Choose healthy, lower carb lower calorie snacks: toss salad, cooked vegetables, cottage cheese, peanut butter, low fat cheese / string cheese, lower sodium deli meat, tuna salad or chicken salad     HOW TO TREAT LOW BLOOD SUGARS (Blood sugar LESS THAN 70 MG/DL)  Please follow the RULE OF 15 for the treatment of hypoglycemia treatment (when your (blood sugars are less than 70 mg/dL)    STEP 1: Take 15 grams of carbohydrates when your blood sugar is low, which includes:   3-4 GLUCOSE TABS  OR  3-4 OZ OF JUICE OR REGULAR SODA OR  ONE TUBE OF GLUCOSE GEL     STEP 2: RECHECK blood sugar in 15 MINUTES STEP 3: If your blood sugar is still low at the 15 minute recheck --> then, go back to STEP 1 and treat AGAIN with another 15 grams of carbohydrates.

## 2018-06-27 NOTE — Progress Notes (Signed)
Name: Ashley Freeman  MRN/ DOB: [id number], [birth year]   Age/ Sex: 28 y.o., female    PCP: Charlott Rakes, MD   Reason for Endocrinology Evaluation: Type 1 Diabetes Mellitus     Date of Initial Endocrinology Visit: 06/27/2018     PATIENT IDENTIFIER: Ashley Freeman is a 28 y.o. female with a past medical history of T1DM, . The patient presented for initial endocrinology clinic visit on 06/27/2018 for consultative assistance with her diabetes management.    HPI: Ashley Freeman was    Diagnosed with T1DM age 88 Prior Medications tried/Intolerance: n/a Currently checking blood sugars 3-4 x / day,  Before and after you eat  Hypoglycemia episodes : yes             Symptoms: sweating, shaking               Frequency: 2-3/ week  Hemoglobin A1c has ranged from 11.3% in 2011, peaking at 19.3% in 2015. 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 Diabetes mellitus 2001   Diagnosed at age 59 ; Type I  . Diarrhea 05/30/2016  . DKA (diabetic ketoacidoses) ([place]) 08/19/2013  . DKA (diabetic ketoacidoses) ([place]) 05/30/2016  . Gonorrhea 08/2011   Treated in 09/2011   Past Surgical History:  Past Surgical History:  Procedure Laterality Date  . INCISION AND DRAINAGE PERIRECTAL ABSCESS  Right 08/18/2013   Procedure: IRRIGATION AND DEBRIDEMENT GLUTEAL ABSCESS;  Surgeon: Ralene Ok, MD;  Location: [facility];  Service: General;  Laterality: Right;  . INCISION AND DRAINAGE PERIRECTAL ABSCESS Right 09/19/2013   Procedure: IRRIGATION AND DEBRIDEMENT RIGHT GLUTEAL AND LABIAL ABSCESSES;  Surgeon: Ralene Ok, MD;  Location: [facility];  Service: General;  Laterality: Right;  . INCISION AND DRAINAGE PERIRECTAL ABSCESS Right 09/24/2013   Procedure: IRRIGATION AND DEBRIDEMENT PERIRECTAL ABSCESS;  Surgeon: Gwenyth Ober, MD;  Location: [facility];  Service: General;  Laterality: Right;      Social History:  reports that she has never smoked. She has never used smokeless tobacco. She reports previous alcohol use. She reports that she does not use drugs. Family History:  Family History  Problem Relation Age of Onset  . Asthma Mother   . Gout Father   . Diabetes Paternal Grandmother   . Anesthesia problems Neg Hx      HOME MEDICATIONS: Allergies as of 06/27/2018      Reactions   Penicillins Hives, Rash   Has patient had a PCN reaction causing immediate rash, facial/tongue/throat swelling, SOB or lightheadedness with hypotension: Yes Has patient had a PCN reaction causing severe rash involving mucus membranes or skin necrosis: No Has patient had a PCN reaction that required hospitalization: Yes Has patient had a PCN reaction occurring within the last 10 years: No  If all of the above answers are "NO", then may proceed with Cephalosporin use.   Benadryl [diphenhydramine] Itching   Doxycycline Itching      Medication List       Accurate as of June 27, 2018  8:36 AM. Always use your most recent med list.        azithromycin 250 MG tablet Commonly known as:  ZITHROMAX Take 4 tabs orally once   blood glucose meter kit and supplies Kit Dispense based on patient and insurance preference. Use up to four times daily as directed. (FOR ICD-9 250.00, 250.01).   cyclobenzaprine 10 MG  tablet Commonly known as:  FLEXERIL Take 1 tablet (10 mg total) by mouth 3 (three) times daily as needed for muscle spasms.   HUMALOG KWIKPEN 100 UNIT/ML KwikPen Generic drug:  insulin lispro 0-12 units subcutaneously 3 times daily before meals as per sliding scale. MUST MAKE APPT FOR FURTHER REFILLS   ibuprofen 200 MG tablet Commonly known as:  ADVIL,MOTRIN Take 400 mg by mouth daily as needed (pain).   Insulin Glargine 100 UNIT/ML Solostar Pen Commonly known as:  LANTUS Inject 25 Units into the skin 2 (two) times daily.   lisinopril 2.5 MG tablet Commonly known as:  PRINIVIL,ZESTRIL Take 1 tablet (2.5 mg total) by mouth daily.   meloxicam 7.5 MG tablet Commonly known as:  MOBIC Take 1 tablet (7.5 mg total) by mouth daily.   metoCLOPramide 10 MG tablet Commonly known as:  REGLAN Take 1 tablet (10 mg total) by mouth 3 (three) times daily before meals.   metroNIDAZOLE 0.75 % vaginal gel Commonly known as:  METROGEL VAGINAL Place 1 Applicatorful vaginally at bedtime.        ALLERGIES: Allergies  Allergen Reactions  . Penicillins Hives and Rash    Has patient had a PCN reaction causing immediate rash, facial/tongue/throat swelling, SOB or lightheadedness with hypotension: Yes Has patient had a PCN reaction causing severe rash involving mucus membranes or skin necrosis: No Has patient had a PCN reaction that required hospitalization: Yes Has patient had a PCN reaction occurring within the last 10 years: No If all of the above answers are "NO", then may proceed with Cephalosporin use.  . Benadryl [Diphenhydramine] Itching  . Doxycycline Itching     REVIEW OF SYSTEMS: A comprehensive ROS was conducted with the patient and is negative except as per HPI and below:  Review of Systems  Constitutional: Negative for weight loss.  HENT: Negative for congestion and sore throat.   Eyes: Negative for blurred vision and pain.  Respiratory: Negative for cough and shortness of  breath.   Cardiovascular: Negative for chest pain and palpitations.  Gastrointestinal: Negative for diarrhea and nausea.  Genitourinary: Positive for frequency.  Skin: Negative.   Neurological: Negative for tingling and tremors.  Endo/Heme/Allergies: Positive for polydipsia.  Psychiatric/Behavioral: Negative for depression. The patient is not nervous/anxious.       OBJECTIVE:   VITAL SIGNS: BP 118/86 (BP Location: Left Arm, Patient Position: Sitting, Cuff Size: Normal)   Pulse (!) 118   Ht '5\' 3"'  (1.6 m)   Wt 139 lb (63 kg)   SpO2 97%   BMI 24.62 kg/m    PHYSICAL EXAM:  General: Pt appears well and is in NAD  Hydration: Well-hydrated with moist mucous membranes and good skin turgor  HEENT: Head: Unremarkable with good dentition. Oropharynx clear without exudate.  Eyes: External eye exam normal without stare, lid lag or exophthalmos.  EOM intact.  PERRL.  Neck: General: Supple without adenopathy or carotid bruits. Thyroid: Thyroid size normal.  No goiter or nodules appreciated. No thyroid bruit.  Lungs: Clear with good BS bilat with no rales, rhonchi, or wheezes  Heart: RRR with normal S1 and S2 and no gallops; no murmurs; no rub  Abdomen: Normoactive bowel sounds, soft, nontender, without masses or organomegaly palpable  Extremities:  Lower extremities - No pretibial edema. No lesions.  Skin: Normal texture and temperature to palpation. No rash noted. No Acanthosis nigricans/skin tags. No lipohypertrophy.  Neuro: MS is good with appropriate affect, pt is alert and Ox3    DM foot exam: 06/07/2018 The skin of the feet is intact without sores or ulcerations. The pedal pulses are 2+ on right and 2+ on left. The sensation is intact to a screening 5.07, 10 gram monofilament bilaterally   DATA REVIEWED:  Lab Results  Component Value Date   HGBA1C  06/04/2018     Comment:     >15   HGBA1C 15.5 (H) 06/19/2017   HGBA1C >15.5 (H) 10/14/2016           Results for  Ashley Freeman, Ashley Freeman (MRN [id number]) as of 06/27/2018 09:22  Ref. Range 06/04/2018 11:18  Microalbumin, Urine Latest Ref Range: Not Estab. ug/mL 81.6  MICROALB/CREAT RATIO Latest Ref Range: 0 - 29 mg/g creat 101 (H)  Creatinine, Urine Latest Ref Range: Not Estab. mg/dL 81.0   ASSESSMENT / PLAN / RECOMMENDATIONS:   1) Type 1 Diabetes Mellitus, Poorly controlled, Without complications - Most recent A1c of >15.0 %. Goal A1c < 7.0 %.    Plan: GENERAL:  Her historically poorly controlled diabetes is due to suboptimal medical management, and due to dietary indiscretions, which is mainly due to the lack of education to the patient. I have discussed with the patient the pathophysiology of diabetes. We went over the natural progression of the disease. We talked about both insulin resistance and insulin deficiency. We stressed the importance of lifestyle changes including diet and exercise. I explained the complications associated with diabetes including retinopathy, nephropathy, neuropathy as well as increased risk of cardiovascular disease. We went over the benefit seen with glycemic control.   I explained to the patient that diabetic patients are at higher than normal risk for amputations. The patient was informed that diabetes is the number one cause of non-traumatic amputations in [place].   Ashley Freeman has done a pretty good job in checking her sugars at home and documented her glucose readings.  Discussed pharmacokinetics of basal/bolus insulin and the importance of taking prandial insulin with meals.   We also discussed avoiding sugar-sweetened beverages and snacks, when possible.   Discussed the impotance of avoiding pregnancy at this time and the importance of planning any future pregnancies, she was advised it is recommended if she decides to conceive in the future to have an a prepregnancy A1c of 7.0%, we did discuss A1c goal of 6 to 6.5% during pregnancy.  Patient has been noted to eat  cake icing and does drink sugar sweetened beverages, she will be referred to our CDE as the patient states she does not know what carbohydrates are.   MEDICATIONS:  Decrease Lantus to 18 units daily  Start Humalog at 6 units 3 times daily with meals  If pre-meal BG is 200 or over, she will add 2 additional units of Humalog to her meal.  EDUCATION / INSTRUCTIONS:  BG monitoring instructions: Patient is instructed to check her blood sugars 4 times a day,  before meals and bedtime.  Call [facility] if: BG persistently < 70 or > 300. . I reviewed the Rule of 15 for the treatment of hypoglycemia in detail with the patient. Literature supplied.   2) Diabetic complications:   Eye: Does not have known diabetic retinopathy.   Neuro/ Feet: Does not have known diabetic peripheral neuropathy.  Renal: Patient does not have known baseline CKD. She is not on an ACEI/ARB at present.   3) Lipids: Patient is not on a statin.  Statins are not indicated at this age.   4) Tachycardia :   -Patient states she has chronic tachycardia, given her history of type 1 diabetes, will consider checking thyroid functions. - Will check TFT's    Follow-up in 2 weeks   Signed electronically by: Mack Guise, MD  [facility]  [facility] [place],  [address] Phone: [phone] FAX: [phone]   CC: Charlott Rakes, [place] [address] Phone: [phone]  Fax: [phone]    Return to Endocrinology clinic as below: Future Appointments  Date Time Provider [place]  07/09/2018 10:10 AM Charlott Rakes, MD [id number] None

## 2018-07-09 ENCOUNTER — Encounter: Payer: BLUE CROSS/BLUE SHIELD | Admitting: Family Medicine

## 2018-07-11 ENCOUNTER — Ambulatory Visit: Payer: BLUE CROSS/BLUE SHIELD | Admitting: Internal Medicine

## 2018-07-11 NOTE — Progress Notes (Deleted)
Name: Ashley Freeman  Age/ Sex: 28 y.o., female   MRN/ DOB: [id number], [birth year]     PCP: Charlott Rakes, MD   Reason for Endocrinology Evaluation: Type 1 Diabetes Mellitus  Initial Endocrine Consultative Visit: 06/27/2018    PATIENT IDENTIFIER: Ashley Freeman is a 28 y.o. female with a past medical history of T1DM. The patient has followed with Endocrinology clinic since *** for consultative assistance with management of her diabetes.  DIABETIC HISTORY:  Ashley Freeman was diagnosed with T1DM at age 59. Her hemoglobin A1c has ranged from 11.3% in 2011, peaking at 19.3% in 2015.  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   Today (07/11/2018): Ashley Freeman is here for a 2 week follow up. She checks her blood sugars *** times daily, preprandial to breakfast and ***. The patient has *** had hypoglycemic episodes since the last clinic visit, which typically occur *** x / - most often occuring ***. The patient is *** symptomatic with these episodes, with symptoms of {symptoms; hypoglycemia:[id number]}. Otherwise, the patient has not required any recent emergency interventions for hypoglycemia and has not had recent hospitalizations secondary to hyper or hypoglycemic episodes.    ROS: As per HPI and as detailed below: ROS    HOME DIABETES REGIMEN:  Lantus 18 units daily Humalog 6 units TIDQAC Correctional insulin: add 2 units of humalog if BG >200 mg/dL      METER DOWNLOAD SUMMARY: Date range evaluated: *** Fingerstick Blood Glucose Tests = *** Average Number Tests/Day = *** Overall Mean FS Glucose = *** Standard Deviation = ***  BG Ranges: Low = *** High = ***   Hypoglycemic Events/30 Days: BG < 50 = *** Episodes of symptomatic severe  hypoglycemia = ***     HISTORY:  Past Medical History:  Past Medical History:  Diagnosis Date  . Diabetes mellitus 2001   Diagnosed at age 32 ; Type I  . Diarrhea 05/30/2016  . DKA (diabetic ketoacidoses) ([place]) 08/19/2013  . DKA (diabetic ketoacidoses) ([place]) 05/30/2016  . Gonorrhea 08/2011   Treated in 09/2011    Past Surgical History:  Past Surgical History:  Procedure Laterality Date  . INCISION AND DRAINAGE PERIRECTAL ABSCESS Right 08/18/2013   Procedure: IRRIGATION AND DEBRIDEMENT GLUTEAL ABSCESS;  Surgeon: Ralene Ok, MD;  Location: [facility];  Service: General;  Laterality: Right;  . INCISION AND DRAINAGE PERIRECTAL ABSCESS Right 09/19/2013   Procedure: IRRIGATION AND DEBRIDEMENT RIGHT GLUTEAL AND LABIAL ABSCESSES;  Surgeon: Ralene Ok, MD;  Location: [facility];  Service: General;  Laterality: Right;  . INCISION AND DRAINAGE PERIRECTAL ABSCESS Right 09/24/2013   Procedure: IRRIGATION AND DEBRIDEMENT PERIRECTAL ABSCESS;  Surgeon: Gwenyth Ober, MD;  Location: [facility];  Service: General;  Laterality: Right;     Social History:  reports that she has never smoked. She has never used smokeless tobacco. She reports previous alcohol use. She reports that she does not use drugs. Family History:  Family History  Problem Relation Age of Onset  . Asthma Mother   . Gout Father   . Diabetes Paternal Grandmother   . Anesthesia problems Neg Hx       HOME MEDICATIONS: Allergies as of 07/11/2018      Reactions   Penicillins Hives, Rash   Has patient had a PCN reaction causing immediate rash, facial/tongue/throat swelling, SOB or lightheadedness with hypotension:  Yes Has patient had a PCN reaction causing severe rash involving mucus membranes or skin necrosis: No Has patient had a PCN reaction that required hospitalization: Yes Has patient had a PCN reaction occurring within the last 10 years: No If all of the above answers are "NO", then may proceed with Cephalosporin use.   Benadryl  [diphenhydramine] Itching   Doxycycline Itching      Medication List       Accurate as of July 11, 2018  7:51 AM. Always use your most recent med list.        azithromycin 250 MG tablet Commonly known as:  ZITHROMAX Take 4 tabs orally once   blood glucose meter kit and supplies Kit Dispense based on patient and insurance preference. Use up to four times daily as directed. (FOR ICD-9 250.00, 250.01).   cyclobenzaprine 10 MG tablet Commonly known as:  FLEXERIL Take 1 tablet (10 mg total) by mouth 3 (three) times daily as needed for muscle spasms.   HumaLOG KwikPen 100 UNIT/ML KwikPen Generic drug:  insulin lispro Inject 0.06 mLs (6 Units total) into the skin 3 (three) times daily. Max daily dose 30 units daily   ibuprofen 200 MG tablet Commonly known as:  ADVIL,MOTRIN Take 400 mg by mouth daily as needed (pain).   Insulin Glargine 100 UNIT/ML Solostar Pen Commonly known as:  LANTUS Inject 18 Units into the skin daily.   lisinopril 2.5 MG tablet Commonly known as:  PRINIVIL,ZESTRIL Take 1 tablet (2.5 mg total) by mouth daily.   meloxicam 7.5 MG tablet Commonly known as:  MOBIC Take 1 tablet (7.5 mg total) by mouth daily.   metoCLOPramide 10 MG tablet Commonly known as:  REGLAN Take 1 tablet (10 mg total) by mouth 3 (three) times daily before meals.   metroNIDAZOLE 0.75 % vaginal gel Commonly known as:  METROGEL VAGINAL Place 1 Applicatorful vaginally at bedtime.        OBJECTIVE:   Vital Signs: There were no vitals taken for this visit.  Wt Readings from Last 3 Encounters:  06/27/18 139 lb (63 kg)  06/04/18 135 lb 9.6 oz (61.5 kg)  04/12/18 118 lb (53.5 kg)     Exam: General: Pt appears well and is in NAD  Hydration: Well-hydrated with moist mucous membranes and good skin turgor  HEENT: Head: Unremarkable with good dentition. Oropharynx clear without exudate.  Eyes: External eye exam normal without stare, lid lag or exophthalmos.  EOM intact.  PERRL.   Neck: General: Supple without adenopathy. Thyroid: Thyroid size normal.  No goiter or nodules appreciated. No thyroid bruit.  Lungs: Clear with good BS bilat with no rales, rhonchi, or wheezes  Heart: RRR with normal S1 and S2 and no gallops; no murmurs; no rub  Abdomen: Normoactive bowel sounds, soft, nontender, without masses or organomegaly palpable  Extremities: No pretibial edema. No tremor. Normal strength and motion throughout. See detailed diabetic foot exam below.  Skin: Normal texture and temperature to palpation. No rash noted. No Acanthosis nigricans/skin tags. No lipohypertrophy.  Neuro: MS is good with appropriate affect, pt is alert and Ox3    DM foot exam: 06/07/2018 The skin of the feet is intact without sores or ulcerations. The pedal pulses are 2+ on right and 2+ on left. The sensation is intact to a screening 5.07, 10 gram monofilament bilaterally           DATA REVIEWED:  Lab Results  Component Value Date   HGBA1C  06/04/2018  Comment:     >15   HGBA1C 15.5 (H) 06/19/2017   HGBA1C >15.5 (H) 10/14/2016   Lab Results  Component Value Date   MICROALBUR 7.2 03/27/2016   LDLCALC 192 (H) 03/27/2016   CREATININE 0.70 05/13/2018   Lab Results  Component Value Date   MICRALBCREAT 48 (H) 03/27/2016    No results found for: FRUCTOSAMINE   Lab Results  Component Value Date   CHOL 272 (H) 03/27/2016   HDL 47 (L) 03/27/2016   LDLCALC 192 (H) 03/27/2016   TRIG 163 (H) 03/27/2016   CHOLHDL 5.8 (H) 03/27/2016         ASSESSMENT / PLAN / RECOMMENDATIONS:   1) Type 1 Diabetes Mellitus, poorly controlled, With out complications - Most recent A1c of >15.0%  %. Goal A1c < 7.0 %.    Plan: MEDICATIONS:  ***  EDUCATION / INSTRUCTIONS:  BG monitoring instructions: Patient is instructed to check her blood sugars *** times a day, ***.  Call [facility] if: BG persistently < 70 or > 300. . I reviewed the Rule of 15 for the treatment of  hypoglycemia in detail with the patient. Literature supplied.    F/U in ***    Signed electronically by: Mack Guise, MD  [facility]  [facility] [address] [place],  [address] Phone: [phone] FAX: [phone]   CC: Charlott Rakes, [place] [address] Phone: [phone]  Fax: [phone]  Return to Endocrinology clinic as below: Future Appointments  Date Time Provider [place]  07/11/2018  9:30 AM Shamleffer, Melanie Crazier, MD [id number] None  07/18/2018  9:30 AM Anne Shutter, RD [place] [facility]  08/25/2018 10:10 AM Charlott Rakes, MD [id number] None

## 2018-07-18 ENCOUNTER — Ambulatory Visit: Payer: BLUE CROSS/BLUE SHIELD | Admitting: *Deleted

## 2018-07-28 ENCOUNTER — Other Ambulatory Visit: Payer: Self-pay

## 2018-07-28 ENCOUNTER — Emergency Department (HOSPITAL_COMMUNITY)
Admission: EM | Admit: 2018-07-28 | Discharge: 2018-07-28 | Disposition: A | Discharge status: Home / Self Care | Payer: BLUE CROSS/BLUE SHIELD | Attending: Emergency Medicine | Admitting: Emergency Medicine

## 2018-07-28 DIAGNOSIS — E109 Type 1 diabetes mellitus without complications: Secondary | ICD-10-CM | POA: Diagnosis not present

## 2018-07-28 DIAGNOSIS — N898 Other specified noninflammatory disorders of vagina: Secondary | ICD-10-CM | POA: Diagnosis not present

## 2018-07-28 DIAGNOSIS — Z79899 Other long term (current) drug therapy: Secondary | ICD-10-CM | POA: Diagnosis not present

## 2018-07-28 LAB — WET PREP, GENITAL
Sperm: NONE SEEN
Trich, Wet Prep: NONE SEEN
Yeast Wet Prep HPF POC: NONE SEEN

## 2018-07-28 LAB — POC URINE PREG, ED: Preg Test, Ur: NEGATIVE

## 2018-07-28 MED ORDER — METRONIDAZOLE 500 MG PO TABS: 500.0000 mg | ORAL_TABLET | Freq: Two times a day (BID) | ORAL | 0 refills | Status: DC

## 2018-07-28 NOTE — ED Provider Notes (Signed)
[facility] EMERGENCY DEPARTMENT Provider Note   CSN: [id number] Arrival date & time: 07/28/18  1510    History   Chief Complaint Chief Complaint  Patient presents with  . Vaginal Itching    HPI Ashley Freeman is a 28 y.o. female with a past medical history of DM 1, recently tested positive for chlamydia, who presents today for evaluation of vaginal itching.  She reports that she completed her azithromycin course after being diagnosed with chlamydia.  She returned to her primary care doctor she has had vaginal itching.  She was given a prescription for metronidazole gel which she says she finished last Monday.  She reports compliance with both of the antibiotics.  She denies any dysuria, increased frequency or urgency.  She does state that she switched her body soap from the Gerber powder sent to one that has more fragrance during this time.  She orts that she has not had any sexual contact since she was treated for the chlamydia.  She denies any other complaints or concerns today.  She says she is usually tachycardic at the doctors office and denies any chest pain or shortness of breath.       HPI  Past Medical History:  Diagnosis Date  . Diabetes mellitus 2001   Diagnosed at age 24 ; Type I  . Diarrhea 05/30/2016  . DKA (diabetic ketoacidoses) ([place]) 08/19/2013  . DKA (diabetic ketoacidoses) ([place]) 05/30/2016  . Gonorrhea 08/2011   Treated in 09/2011    Patient Active Problem List   Diagnosis Date Noted  . DKA (diabetic ketoacidosis) ([place]) 12/29/2016  . Inguinal abscess   . History of trichomoniasis 05/31/2016  . Diarrhea 05/31/2016  . DKA (diabetic ketoacidoses) ([place]) 05/30/2016  . Hyperlipidemia 03/28/2016  . Diabetic neuropathy ([place]) 01/16/2016  . DKA, type 1 ([place]) 11/24/2015  . BV (bacterial vaginosis) 11/24/2015  . Chest pain 11/24/2015  . AKI (acute kidney injury) ([place]) 07/26/2014  . Infection of urinary tract 04/08/2014  . PID (acute pelvic  inflammatory disease) 04/06/2014  . Diabetic ketoacidosis without coma associated with type 1 diabetes mellitus ([place]) 09/19/2013  . Infected cyst of Bartholin's gland duct 09/19/2013  . Bartholin's gland abscess 09/19/2013  . Sepsis ([place]) 09/19/2013  . Abscess, gluteal, right 08/24/2013  . Health care maintenance 08/24/2013  . Hypophosphatemia 08/18/2013  . Hypomagnesemia 08/18/2013  . Leukocytosis, unspecified 08/17/2013  . Fever, unspecified 08/17/2013  . H/O medication noncompliance 08/12/2013  . Hyponatremia 08/04/2012  . Anemia 02/19/2012  . Hyperglycemia 11/06/2011  . Tachycardia 10/17/2011  . Diabetes mellitus type 1, uncontrolled, with complications ([place]) 81/04/7508    Past Surgical History:  Procedure Laterality Date  . INCISION AND DRAINAGE PERIRECTAL ABSCESS Right 08/18/2013   Procedure: IRRIGATION AND DEBRIDEMENT GLUTEAL ABSCESS;  Surgeon: Ralene Ok, MD;  Location: [facility];  Service: General;  Laterality: Right;  . INCISION AND DRAINAGE PERIRECTAL ABSCESS Right 09/19/2013   Procedure: IRRIGATION AND DEBRIDEMENT RIGHT GLUTEAL AND LABIAL ABSCESSES;  Surgeon: Ralene Ok, MD;  Location: [facility];  Service: General;  Laterality: Right;  . INCISION AND DRAINAGE PERIRECTAL ABSCESS Right 09/24/2013   Procedure: IRRIGATION AND DEBRIDEMENT PERIRECTAL ABSCESS;  Surgeon: Gwenyth Ober, MD;  Location: [facility];  Service: General;  Laterality: Right;     OB History    Gravida  1   Para  0   Term      Preterm      AB  1   Living  0  SAB  1   TAB      Ectopic      Multiple      Live Births               Home Medications    Prior to Admission medications   Medication Sig Start Date End Date Taking? Authorizing Provider  azithromycin (ZITHROMAX) 250 MG tablet Take 4 tabs orally once Patient not taking: Reported on 06/27/2018 06/06/18   Charlott Rakes, MD  blood glucose meter kit and supplies KIT Dispense based on patient and insurance preference. Use up to  four times daily as directed. (FOR ICD-9 250.00, 250.01). 05/25/17   Isla Pence, MD  cyclobenzaprine (FLEXERIL) 10 MG tablet Take 1 tablet (10 mg total) by mouth 3 (three) times daily as needed for muscle spasms. 06/04/18   Charlott Rakes, MD  HUMALOG KWIKPEN 100 UNIT/ML KwikPen Inject 0.06 mLs (6 Units total) into the skin 3 (three) times daily. Max daily dose 30 units daily 06/27/18   Shamleffer, Melanie Crazier, MD  ibuprofen (ADVIL,MOTRIN) 200 MG tablet Take 400 mg by mouth daily as needed (pain).    [provider]  Insulin Glargine (LANTUS) 100 UNIT/ML Solostar Pen Inject 18 Units into the skin daily. 06/27/18   Shamleffer, Melanie Crazier, MD  lisinopril (PRINIVIL,ZESTRIL) 2.5 MG tablet Take 1 tablet (2.5 mg total) by mouth daily. 06/05/18   Charlott Rakes, MD  meloxicam (MOBIC) 7.5 MG tablet Take 1 tablet (7.5 mg total) by mouth daily. 06/04/18   Charlott Rakes, MD  metoCLOPramide (REGLAN) 10 MG tablet Take 1 tablet (10 mg total) by mouth 3 (three) times daily before meals. 06/04/18   Charlott Rakes, MD  metroNIDAZOLE (FLAGYL) 500 MG tablet Take 1 tablet (500 mg total) by mouth 2 (two) times daily. 07/28/18   Lorin Glass, PA-C  metroNIDAZOLE (METROGEL VAGINAL) 0.75 % vaginal gel Place 1 Applicatorful vaginally at bedtime. 06/09/18   Charlott Rakes, MD    Family History Family History  Problem Relation Age of Onset  . Asthma Mother   . Gout Father   . Diabetes Paternal Grandmother   . Anesthesia problems Neg Hx     Social History Social History   Tobacco Use  . Smoking status: Never Smoker  . Smokeless tobacco: Never Used  Substance Use Topics  . Alcohol use: Not Currently  . Drug use: No     Allergies   Penicillins; Benadryl [diphenhydramine]; and Doxycycline   Review of Systems Review of Systems  Constitutional: Negative for chills and fever.  Respiratory: Negative for chest tightness and shortness of breath.   Cardiovascular: Negative for chest pain  and palpitations.  Gastrointestinal: Negative for abdominal pain, diarrhea, nausea and vomiting.  Genitourinary: Negative for dysuria, frequency, hematuria, pelvic pain, urgency, vaginal bleeding, vaginal discharge and vaginal pain.       Vaginal itching  Musculoskeletal: Negative for back pain and neck pain.  All other systems reviewed and are negative.    Physical Exam Updated Vital Signs BP (!) 156/107 (BP Location: Right Arm)   Pulse (!) 120   Temp 98.1 F (36.7 C) (Oral)   Ht '5\' 3"'  (1.6 m)   Wt 60.8 kg   LMP 06/30/2018   SpO2 99%   BMI 23.74 kg/m   Physical Exam Vitals signs and nursing note reviewed. Exam conducted with a chaperone present (Female).  Constitutional:      General: She is not in acute distress.    Appearance: She is well-developed. She is not  diaphoretic.  HENT:     Head: Normocephalic and atraumatic.  Eyes:     General: No scleral icterus.       Right eye: No discharge.        Left eye: No discharge.     Conjunctiva/sclera: Conjunctivae normal.  Neck:     Musculoskeletal: Normal range of motion.  Cardiovascular:     Rate and Rhythm: Normal rate and regular rhythm.  Pulmonary:     Effort: Pulmonary effort is normal. No respiratory distress.     Breath sounds: No stridor.  Abdominal:     General: There is no distension.  Genitourinary:    Labia:        Right: No lesion.        Left: No lesion.      Vagina: No foreign body. Vaginal discharge present. No erythema, tenderness or bleeding.     Cervix: No cervical motion tenderness or discharge.     Uterus: Not tender.      Adnexa:        Right: No mass, tenderness or fullness.         Left: No mass, tenderness or fullness.    Musculoskeletal:        General: No deformity.  Skin:    General: Skin is warm and dry.  Neurological:     General: No focal deficit present.     Mental Status: She is alert.     Motor: No abnormal muscle tone.  Psychiatric:        Behavior: Behavior normal.       ED Treatments / Results  Labs (all labs ordered are listed, but only abnormal results are displayed) Labs Reviewed  WET PREP, GENITAL - Abnormal; Notable for the following components:      Result Value   Clue Cells Wet Prep HPF POC PRESENT (*)    WBC, Wet Prep HPF POC MANY (*)    All other components within normal limits  POC URINE PREG, ED  GC/CHLAMYDIA PROBE AMP ([place]) NOT AT [facility]    EKG None  Radiology No results found.  Procedures Procedures (including critical care time)  Medications Ordered in ED Medications - No data to display   Initial Impression / Assessment and Plan / ED Course  I have reviewed the triage vital signs and the nursing notes.  Pertinent labs & imaging results that were available during my care of the patient were reviewed by me and considered in my medical decision making (see chart for details).       Patient presents today for evaluation of vaginal itching.  She has recently tested positive for chlamydia, for which she took azithromycin, and for BV for which she used Flagyl gel.  She has clue cells on her wet prep, will treat with p.o. Flagyl.  She also has recently changed soaps, recommended changing back to normal body soap.  We discussed the importance of maintaining strict glycemic control.  She is not pregnant.  She denies any urinary symptoms therefore UA was not obtained.  She is tachycardic here however she reports that that is normal for her and chart review supports this.  She was informed that her blood pressure is elevated and needs to follow-up with primary care doctor.  She denies having any sexual contact since being treated for gonorrhea and chlamydia, and therefore with her allergies well weight for results rather than empirically treating.  She is advised not to drink alcohol while taking Flagyl.  Return precautions were discussed with patient who states their understanding.  At the time of discharge patient denied any  unaddressed complaints or concerns.  Patient is agreeable for discharge home.   Final Clinical Impressions(s) / ED Diagnoses   Final diagnoses:  Vaginal itching    ED Discharge Orders         Ordered    metroNIDAZOLE (FLAGYL) 500 MG tablet  2 times daily     07/28/18 1603           Lorin Glass, [place] 07/28/18 [place], [place], DO 07/28/18 [phone]

## 2018-07-28 NOTE — ED Triage Notes (Signed)
Pt has had vaginal itching for 2 weeks. Saw pcp 3 weeks ago but didn pick up medication until 1 week after appt. Has been using metronidazole gell for 1 week and itching has not imporoved

## 2018-07-28 NOTE — Discharge Instructions (Signed)
You have test for gonorrhea and chlamydia pending.  If these are positive you should be notified in the next 3 to 4 days.  As we discussed please switch back to the soap that you were using.    Today your diagnosed with bacterial vaginosis and received a prescription for metronidazole also known as Flagyl. It is very important that you do not consume any alcohol while taking this medication as it will cause you to become violently ill.  Do not have any sexual contact until you follow up with your primary care doctor or OB/GYN.

## 2018-07-29 LAB — GC/CHLAMYDIA PROBE AMP (~~LOC~~) NOT AT ARMC
Chlamydia: POSITIVE — AB
Neisseria Gonorrhea: NEGATIVE

## 2018-08-01 ENCOUNTER — Telehealth: Payer: Self-pay | Admitting: Family Medicine

## 2018-08-01 NOTE — Telephone Encounter (Signed)
Patient wants to be taken out of work due to her Dm and the covid 19

## 2018-08-04 NOTE — Telephone Encounter (Signed)
Letter is ready for pick-up.

## 2018-08-04 NOTE — Telephone Encounter (Signed)
Patient has been called and a voicemail has been left informing patient that letter is being mailed to address on file.

## 2018-08-05 ENCOUNTER — Ambulatory Visit: Payer: BLUE CROSS/BLUE SHIELD | Admitting: Internal Medicine

## 2018-08-05 ENCOUNTER — Other Ambulatory Visit: Payer: Self-pay

## 2018-08-05 NOTE — Progress Notes (Deleted)
Virtual Visit via Video Note  I connected with Ashley Freeman on 08/05/18 at 10:30 AM EDT by a video enabled telemedicine application and verified that I am speaking with the correct person using two identifiers.   I discussed the limitations of evaluation and management by telemedicine and the availability of in person appointments. The patient expressed understanding and agreed to proceed.   -Location of the patient : -Location of the provider : Office  -The names of all persons participating in the telemedicine service :      PATIENT IDENTIFIER: [name] is a 28 y.o. female with a past medical history of T1DM. The patient has followed with Endocrinology clinic since 06/27/2018 for consultative assistance with management of her diabetes.  DIABETIC HISTORY:  [name] was diagnosed with T1DM at age 45, and started insulin therapy at her diagnosis. Her hemoglobin A1c has ranged from 11.3% in 2011, peaking at 19.3% in 2015.  SUBJECTIVE:   During the last visit (***): ***  Today (08/05/2018): [name] is here for a virtual check-in on diabetes management, she had missed her 2 week follow up back in March. She checks her blood sugars *** times daily, preprandial to breakfast and ***. The patient has *** had hypoglycemic episodes since the last clinic visit, which typically occur *** x / - most often occuring ***. The patient is *** symptomatic with these episodes, with symptoms of {symptoms; hypoglycemia:9084048}. Otherwise, the patient {HAS/HAS NOT:522402} required any recent emergency interventions for hypoglycemia and {HAS/HAS NOT:522402} had recent hospitalizations secondary to hyper or hypoglycemic episodes.    ROS: As per HPI and as detailed below: ROS    HOME DIABETES REGIMEN:    Statin: *** ACE-I/ARB: *** Prior Diabetic Education: ***   METER DOWNLOAD SUMMARY: Date range evaluated: *** Fingerstick Blood Glucose Tests = *** Average Number  Tests/Day = *** Overall Mean FS Glucose = *** Standard Deviation = ***  BG Ranges: Low = *** High = ***   Hypoglycemic Events/30 Days: BG < 50 = *** Episodes of symptomatic severe hypoglycemia = ***    DIABETIC COMPLICATIONS: Microvascular complications:   ***  Denies:   Last Eye Exam: Completed   Macrovascular complications:   ***  Denies: CAD, CVA, PVD   HISTORY:  Past Medical History:  Past Medical History:  Diagnosis Date  . Diabetes mellitus 2001   Diagnosed at age 87 ; Type I  . Diarrhea 05/30/2016  . DKA (diabetic ketoacidoses) ([place]) 08/19/2013  . DKA (diabetic ketoacidoses) ([place]) 05/30/2016  . Gonorrhea 08/2011   Treated in 09/2011    Past Surgical History:  Past Surgical History:  Procedure Laterality Date  . INCISION AND DRAINAGE PERIRECTAL ABSCESS Right 08/18/2013   Procedure: IRRIGATION AND DEBRIDEMENT GLUTEAL ABSCESS;  Surgeon: Ralene Ok, MD;  Location: [facility];  Service: General;  Laterality: Right;  . INCISION AND DRAINAGE PERIRECTAL ABSCESS Right 09/19/2013   Procedure: IRRIGATION AND DEBRIDEMENT RIGHT GLUTEAL AND LABIAL ABSCESSES;  Surgeon: Ralene Ok, MD;  Location: [facility];  Service: General;  Laterality: Right;  . INCISION AND DRAINAGE PERIRECTAL ABSCESS Right 09/24/2013   Procedure: IRRIGATION AND DEBRIDEMENT PERIRECTAL ABSCESS;  Surgeon: Gwenyth Ober, MD;  Location: [facility];  Service: General;  Laterality: Right;     Social History:  reports that she has never smoked. She has never used smokeless tobacco. She reports previous alcohol use. She reports that she does not use drugs. Family History:  Family History  Problem Relation Age of Onset  .  Asthma Mother   . Gout Father   . Diabetes Paternal Grandmother   . Anesthesia problems Neg Hx       HOME MEDICATIONS: Allergies as of 08/05/2018      Reactions   Penicillins Hives, Rash   Has patient had a PCN reaction causing immediate rash, facial/tongue/throat swelling, SOB or  lightheadedness with hypotension: Yes Has patient had a PCN reaction causing severe rash involving mucus membranes or skin necrosis: No Has patient had a PCN reaction that required hospitalization: Yes Has patient had a PCN reaction occurring within the last 10 years: No If all of the above answers are "NO", then may proceed with Cephalosporin use.   Benadryl [diphenhydramine] Itching   Doxycycline Itching      Medication List       Accurate as of August 05, 2018  7:54 AM. Always use your most recent med list.        azithromycin 250 MG tablet Commonly known as:  ZITHROMAX Take 4 tabs orally once   blood glucose meter kit and supplies Kit Dispense based on patient and insurance preference. Use up to four times daily as directed. (FOR ICD-9 250.00, 250.01).   cyclobenzaprine 10 MG tablet Commonly known as:  FLEXERIL Take 1 tablet (10 mg total) by mouth 3 (three) times daily as needed for muscle spasms.   HumaLOG KwikPen 100 UNIT/ML KwikPen Generic drug:  insulin lispro Inject 0.06 mLs (6 Units total) into the skin 3 (three) times daily. Max daily dose 30 units daily   ibuprofen 200 MG tablet Commonly known as:  ADVIL,MOTRIN Take 400 mg by mouth daily as needed (pain).   Insulin Glargine 100 UNIT/ML Solostar Pen Commonly known as:  LANTUS Inject 18 Units into the skin daily.   lisinopril 2.5 MG tablet Commonly known as:  PRINIVIL,ZESTRIL Take 1 tablet (2.5 mg total) by mouth daily.   meloxicam 7.5 MG tablet Commonly known as:  MOBIC Take 1 tablet (7.5 mg total) by mouth daily.   metoCLOPramide 10 MG tablet Commonly known as:  REGLAN Take 1 tablet (10 mg total) by mouth 3 (three) times daily before meals.   metroNIDAZOLE 0.75 % vaginal gel Commonly known as:  METROGEL VAGINAL Place 1 Applicatorful vaginally at bedtime.   metroNIDAZOLE 500 MG tablet Commonly known as:  FLAGYL Take 1 tablet (500 mg total) by mouth 2 (two) times daily.         DATA REVIEWED:   Lab Results  Component Value Date   HGBA1C  06/04/2018     Comment:     >15   HGBA1C 15.5 (H) 06/19/2017   HGBA1C >15.5 (H) 10/14/2016   Lab Results  Component Value Date   MICROALBUR 7.2 03/27/2016   LDLCALC 192 (H) 03/27/2016   CREATININE 0.70 05/13/2018   Lab Results  Component Value Date   MICRALBCREAT 48 (H) 03/27/2016     Lab Results  Component Value Date   CHOL 272 (H) 03/27/2016   HDL 47 (L) 03/27/2016   LDLCALC 192 (H) 03/27/2016   TRIG 163 (H) 03/27/2016   CHOLHDL 5.8 (H) 03/27/2016         ASSESSMENT / PLAN / RECOMMENDATIONS:   1) Type {NUMBERS 1 OR 2:522190} Diabetes Mellitus, ***controlled, With *** complications - Most recent A1c of *** %. Goal A1c < *** %.  ***  Plan: MEDICATIONS:  ***  EDUCATION / INSTRUCTIONS:  BG monitoring instructions: Patient is instructed to check her blood sugars *** times a day, ***.  Call [facility] if: BG persistently < 70 or > 300. . I reviewed the Rule of 15 for the treatment of hypoglycemia in detail with the patient. Literature supplied.  REFERRALS:  ***.   2) Diabetic complications:   Eye: Does *** have known diabetic retinopathy.   Neuro/ Feet: Does *** have known diabetic peripheral neuropathy .   Renal: Patient does *** have known baseline CKD. She   is *** on an ACEI/ARB at present.   3) Lipids: Patient is *** on a statin.  4) Hypertension: *** at goal of < 140/90 mmHg.     I discussed the assessment and treatment plan with the patient. The patient was provided an opportunity to ask questions and all were answered. The patient agreed with the plan and demonstrated an understanding of the instructions.   The patient was advised to call back or seek an in-person evaluation if the symptoms worsen or if the condition fails to improve as anticipated.  I provided *** minutes of non-face-to-face time during this encounter.   F/U in ***    Signed electronically by: Mack Guise, MD  [facility] End[facility]  [facility] [address] [place], [place] [address] Phone: [phone] FAX: [phone]   CC: Charlott Rakes, [place] [address] Phone: [phone]  Fax: [phone]  Return to Endocrinology clinic as below: Future Appointments  Date Time Provider [place]  08/05/2018 10:30 AM Shamleffer, Melanie Crazier, MD [id number] None  08/25/2018 10:10 AM Charlott Rakes, MD [facility] None

## 2018-08-19 ENCOUNTER — Telehealth: Payer: Self-pay | Admitting: Family Medicine

## 2018-08-19 NOTE — Telephone Encounter (Signed)
Patient dropped off FMLA  Paperwork and states that she only has 10 days to turn it back in. Please follow up.

## 2018-08-21 NOTE — Telephone Encounter (Signed)
Patient was called and a voicemail was left informing patient to return phone call. 

## 2018-08-25 ENCOUNTER — Encounter: Payer: BLUE CROSS/BLUE SHIELD | Admitting: Family Medicine

## 2018-08-27 NOTE — Telephone Encounter (Signed)
Patient was called and she informed nurse of need for FMLA paperwork. Patient was informed that paperwork will be faxed over once completed.

## 2018-08-27 NOTE — Telephone Encounter (Signed)
Pt called in wanting to follow up on FMLA paper work for employer please follow up

## 2018-08-30 ENCOUNTER — Inpatient Hospital Stay (HOSPITAL_COMMUNITY)
Admission: AD | Admit: 2018-08-30 | Discharge: 2018-08-30 | Disposition: A | Discharge status: Home / Self Care | Payer: BLUE CROSS/BLUE SHIELD | Attending: Obstetrics and Gynecology | Admitting: Obstetrics and Gynecology

## 2018-08-30 ENCOUNTER — Other Ambulatory Visit: Payer: Self-pay

## 2018-08-30 DIAGNOSIS — R109 Unspecified abdominal pain: Secondary | ICD-10-CM | POA: Insufficient documentation

## 2018-08-30 DIAGNOSIS — Z3202 Encounter for pregnancy test, result negative: Secondary | ICD-10-CM | POA: Diagnosis not present

## 2018-08-30 DIAGNOSIS — N939 Abnormal uterine and vaginal bleeding, unspecified: Secondary | ICD-10-CM | POA: Diagnosis not present

## 2018-08-30 DIAGNOSIS — R5383 Other fatigue: Secondary | ICD-10-CM | POA: Diagnosis not present

## 2018-08-30 DIAGNOSIS — R110 Nausea: Secondary | ICD-10-CM | POA: Diagnosis not present

## 2018-08-30 LAB — POCT PREGNANCY, URINE: Preg Test, Ur: NEGATIVE

## 2018-08-30 NOTE — MAU Note (Signed)
Pt was discharged out of triage by [place] NP

## 2018-08-30 NOTE — MAU Provider Note (Signed)
First Provider Initiated Contact with Patient 08/30/18 1118      S [name] is a 28 y.o. G1P0010 non-pregnant female who presents to MAU today with no complaints. Pt states she is only here for a pregnancy test, though pt reported to the nurse that she was having cramping.   O BP 107/70 (BP Location: Right Arm)   Pulse (!) 128   Temp 98.5 F (36.9 C) (Oral)   Resp 18   Ht 5\' 3"  (1.6 m)   Wt 60 kg   LMP 07/18/2018   SpO2 100%   BMI 23.42 kg/m  Physical Exam  Constitutional: She is oriented to person, place, and time. She appears well-developed and well-nourished. No distress.  HENT:  Head: Normocephalic and atraumatic.  Respiratory: Effort normal.  Neurological: She is alert and oriented to person, place, and time.  Skin: Skin is warm and dry. She is not diaphoretic.  Psychiatric: She has a normal mood and affect. Her behavior is normal. Judgment and thought content normal.  Pt reports her pulse is typically elevated. Upon chart review over ED visits in the last 44mo, pt pulse ranges from 107-128.  A Non pregnant female Medical screening exam complete  P Discharge from MAU in stable condition Patient given the option of transfer to [facility] for further evaluation or seek care in outpatient facility of choice List of options for follow-up given  Warning signs for worsening condition that would warrant emergency follow-up discussed Patient may return to MAU as needed for pregnancy related complaints  Grason Brailsford, Gerrie Nordmann, NP 08/30/2018 11:18 AM

## 2018-08-30 NOTE — MAU Note (Signed)
Ashley Freeman is a 28 y.o. here in MAU reporting: states she thinks she is pregnant, states she has not done a test home because "those things never work for me". States she has been having some nausea and tiredness. Had some bleeding at the beginning of April but none since then. Reports she has been having some cramping, last time she had cramping was last night.  LMP: 07/18/18  Pain score: 0/10  Vitals:   08/30/18 1102  BP: 107/70  Pulse: (!) 128  Resp: 18  Temp: 98.5 F (36.9 C)  SpO2: 100%      Lab orders placed from triage: UPT

## 2018-09-02 ENCOUNTER — Other Ambulatory Visit: Payer: Self-pay

## 2018-09-02 ENCOUNTER — Ambulatory Visit: Payer: BLUE CROSS/BLUE SHIELD | Attending: Family Medicine | Admitting: Family Medicine

## 2018-09-02 DIAGNOSIS — E1065 Type 1 diabetes mellitus with hyperglycemia: Secondary | ICD-10-CM

## 2018-09-02 NOTE — Progress Notes (Signed)
Patient did not answer for phone visit.

## 2018-10-21 ENCOUNTER — Other Ambulatory Visit: Payer: Self-pay | Admitting: Family Medicine

## 2018-10-21 DIAGNOSIS — E1065 Type 1 diabetes mellitus with hyperglycemia: Secondary | ICD-10-CM

## 2018-10-23 ENCOUNTER — Other Ambulatory Visit: Payer: Self-pay

## 2018-10-23 ENCOUNTER — Encounter (HOSPITAL_COMMUNITY): Payer: Self-pay | Admitting: Obstetrics and Gynecology

## 2018-10-23 ENCOUNTER — Observation Stay (HOSPITAL_COMMUNITY)
Admission: EM | Admit: 2018-10-23 | Discharge: 2018-10-24 | Disposition: A | Discharge status: Home / Self Care | Payer: BLUE CROSS/BLUE SHIELD | Attending: Internal Medicine | Admitting: Internal Medicine

## 2018-10-23 DIAGNOSIS — E1010 Type 1 diabetes mellitus with ketoacidosis without coma: Principal | ICD-10-CM

## 2018-10-23 DIAGNOSIS — Z881 Allergy status to other antibiotic agents status: Secondary | ICD-10-CM | POA: Insufficient documentation

## 2018-10-23 DIAGNOSIS — N179 Acute kidney failure, unspecified: Secondary | ICD-10-CM | POA: Diagnosis not present

## 2018-10-23 DIAGNOSIS — Z880 Allergy status to penicillin: Secondary | ICD-10-CM | POA: Diagnosis not present

## 2018-10-23 DIAGNOSIS — Z9114 Patient's other noncompliance with medication regimen: Secondary | ICD-10-CM | POA: Diagnosis not present

## 2018-10-23 DIAGNOSIS — I10 Essential (primary) hypertension: Secondary | ICD-10-CM | POA: Diagnosis not present

## 2018-10-23 DIAGNOSIS — Z79899 Other long term (current) drug therapy: Secondary | ICD-10-CM | POA: Insufficient documentation

## 2018-10-23 DIAGNOSIS — N390 Urinary tract infection, site not specified: Secondary | ICD-10-CM

## 2018-10-23 DIAGNOSIS — E871 Hypo-osmolality and hyponatremia: Secondary | ICD-10-CM | POA: Diagnosis not present

## 2018-10-23 DIAGNOSIS — E1065 Type 1 diabetes mellitus with hyperglycemia: Secondary | ICD-10-CM

## 2018-10-23 DIAGNOSIS — Z1159 Encounter for screening for other viral diseases: Secondary | ICD-10-CM | POA: Insufficient documentation

## 2018-10-23 DIAGNOSIS — Z833 Family history of diabetes mellitus: Secondary | ICD-10-CM | POA: Diagnosis not present

## 2018-10-23 DIAGNOSIS — Z794 Long term (current) use of insulin: Secondary | ICD-10-CM | POA: Insufficient documentation

## 2018-10-23 DIAGNOSIS — E876 Hypokalemia: Secondary | ICD-10-CM | POA: Diagnosis not present

## 2018-10-23 DIAGNOSIS — E1040 Type 1 diabetes mellitus with diabetic neuropathy, unspecified: Secondary | ICD-10-CM | POA: Insufficient documentation

## 2018-10-23 DIAGNOSIS — E1110 Type 2 diabetes mellitus with ketoacidosis without coma: Secondary | ICD-10-CM | POA: Diagnosis present

## 2018-10-23 DIAGNOSIS — Z888 Allergy status to other drugs, medicaments and biological substances status: Secondary | ICD-10-CM | POA: Diagnosis not present

## 2018-10-23 DIAGNOSIS — R7989 Other specified abnormal findings of blood chemistry: Secondary | ICD-10-CM | POA: Diagnosis present

## 2018-10-23 LAB — COMPREHENSIVE METABOLIC PANEL
ALT: 20 U/L (ref 0–44)
AST: 22 U/L (ref 15–41)
Albumin: 3.8 g/dL (ref 3.5–5.0)
Alkaline Phosphatase: 81 U/L (ref 38–126)
Anion gap: 22 — ABNORMAL HIGH (ref 5–15)
BUN: 24 mg/dL — ABNORMAL HIGH (ref 6–20)
CO2: 11 mmol/L — ABNORMAL LOW (ref 22–32)
Calcium: 9.4 mg/dL (ref 8.9–10.3)
Chloride: 94 mmol/L — ABNORMAL LOW (ref 98–111)
Creatinine, Ser: 1.3 mg/dL — ABNORMAL HIGH (ref 0.44–1.00)
GFR calc Af Amer: >60 mL/min (ref >60)
GFR calc non Af Amer: 57 mL/min — ABNORMAL LOW (ref >60)
Glucose, Bld: 783 mg/dL (ref 70–99)
Potassium: 4.4 mmol/L (ref 3.5–5.1)
Sodium: 127 mmol/L — ABNORMAL LOW (ref 135–145)
Total Bilirubin: 1.2 mg/dL (ref 0.3–1.2)
Total Protein: 7.9 g/dL (ref 6.5–8.1)

## 2018-10-23 LAB — BASIC METABOLIC PANEL
Anion gap: 19 — ABNORMAL HIGH (ref 5–15)
Anion gap: 10 (ref 5–15)
Anion gap: 7 (ref 5–15)
BUN: 24 mg/dL — ABNORMAL HIGH (ref 6–20)
BUN: 20 mg/dL (ref 6–20)
BUN: 18 mg/dL (ref 6–20)
CO2: 12 mmol/L — ABNORMAL LOW (ref 22–32)
CO2: 19 mmol/L — ABNORMAL LOW (ref 22–32)
CO2: 22 mmol/L (ref 22–32)
Calcium: 9.3 mg/dL (ref 8.9–10.3)
Calcium: 9.1 mg/dL (ref 8.9–10.3)
Calcium: 8.8 mg/dL — ABNORMAL LOW (ref 8.9–10.3)
Chloride: 103 mmol/L (ref 98–111)
Chloride: 109 mmol/L (ref 98–111)
Chloride: 109 mmol/L (ref 98–111)
Creatinine, Ser: 1.21 mg/dL — ABNORMAL HIGH (ref 0.44–1.00)
Creatinine, Ser: 0.92 mg/dL (ref 0.44–1.00)
Creatinine, Ser: 0.85 mg/dL (ref 0.44–1.00)
GFR calc Af Amer: >60 mL/min (ref >60)
GFR calc Af Amer: >60 mL/min (ref >60)
GFR calc Af Amer: >60 mL/min (ref >60)
GFR calc non Af Amer: >60 mL/min (ref >60)
GFR calc non Af Amer: >60 mL/min (ref >60)
GFR calc non Af Amer: >60 mL/min (ref >60)
Glucose, Bld: 529 mg/dL (ref 70–99)
Glucose, Bld: 230 mg/dL — ABNORMAL HIGH (ref 70–99)
Glucose, Bld: 128 mg/dL — ABNORMAL HIGH (ref 70–99)
Potassium: 3.8 mmol/L (ref 3.5–5.1)
Potassium: 3.7 mmol/L (ref 3.5–5.1)
Potassium: 3.6 mmol/L (ref 3.5–5.1)
Sodium: 134 mmol/L — ABNORMAL LOW (ref 135–145)
Sodium: 138 mmol/L (ref 135–145)
Sodium: 138 mmol/L (ref 135–145)

## 2018-10-23 LAB — URINALYSIS, ROUTINE W REFLEX MICROSCOPIC
Bilirubin Urine: NEGATIVE
Glucose, UA: >=500 mg/dL — AB
Ketones, ur: 20 mg/dL — AB
Nitrite: NEGATIVE
Protein, ur: NEGATIVE mg/dL
Specific Gravity, Urine: 1.026 (ref 1.005–1.030)
pH: 5 (ref 5.0–8.0)

## 2018-10-23 LAB — BLOOD GAS, ARTERIAL
Acid-base deficit: 14.7 mmol/L — ABNORMAL HIGH (ref 0.0–2.0)
Bicarbonate: 10.8 mmol/L — ABNORMAL LOW (ref 20.0–28.0)
Drawn by: 295031
FIO2: 21
O2 Saturation: 97.2 %
Patient temperature: 98.6
pCO2 arterial: 25 mmHg — ABNORMAL LOW (ref 32.0–48.0)
pH, Arterial: 7.258 — ABNORMAL LOW (ref 7.350–7.450)
pO2, Arterial: 108 mmHg (ref 83.0–108.0)

## 2018-10-23 LAB — CBC WITH DIFFERENTIAL/PLATELET
Abs Immature Granulocytes: 0.08 10*3/uL — ABNORMAL HIGH (ref 0.00–0.07)
Abs Immature Granulocytes: 0.15 10*3/uL — ABNORMAL HIGH (ref 0.00–0.07)
Basophils Absolute: 0.1 10*3/uL (ref 0.0–0.1)
Basophils Absolute: 0 10*3/uL (ref 0.0–0.1)
Basophils Relative: 1 %
Basophils Relative: 0 %
Eosinophils Absolute: 0.3 10*3/uL (ref 0.0–0.5)
Eosinophils Absolute: 0.1 10*3/uL (ref 0.0–0.5)
Eosinophils Relative: 3 %
Eosinophils Relative: 1 %
HCT: 40.2 % (ref 36.0–46.0)
HCT: 35.3 % — ABNORMAL LOW (ref 36.0–46.0)
Hemoglobin: 12.6 g/dL (ref 12.0–15.0)
Hemoglobin: 11.4 g/dL — ABNORMAL LOW (ref 12.0–15.0)
Immature Granulocytes: 1 %
Immature Granulocytes: 1 %
Lymphocytes Relative: 24 %
Lymphocytes Relative: 15 %
Lymphs Abs: 2.5 10*3/uL (ref 0.7–4.0)
Lymphs Abs: 1.8 10*3/uL (ref 0.7–4.0)
MCH: 27.2 pg (ref 26.0–34.0)
MCH: 27.3 pg (ref 26.0–34.0)
MCHC: 31.3 g/dL (ref 30.0–36.0)
MCHC: 32.3 g/dL (ref 30.0–36.0)
MCV: 86.6 fL (ref 80.0–100.0)
MCV: 84.4 fL (ref 80.0–100.0)
Monocytes Absolute: 0.5 10*3/uL (ref 0.1–1.0)
Monocytes Absolute: 0.5 10*3/uL (ref 0.1–1.0)
Monocytes Relative: 4 %
Monocytes Relative: 4 %
Neutro Abs: 7 10*3/uL (ref 1.7–7.7)
Neutro Abs: 10 10*3/uL — ABNORMAL HIGH (ref 1.7–7.7)
Neutrophils Relative %: 67 %
Neutrophils Relative %: 79 %
Platelets: 326 10*3/uL (ref 150–400)
Platelets: 324 10*3/uL (ref 150–400)
RBC: 4.64 MIL/uL (ref 3.87–5.11)
RBC: 4.18 MIL/uL (ref 3.87–5.11)
RDW: 13.4 % (ref 11.5–15.5)
RDW: 13.3 % (ref 11.5–15.5)
WBC: 10.4 10*3/uL (ref 4.0–10.5)
WBC: 12.5 10*3/uL — ABNORMAL HIGH (ref 4.0–10.5)
nRBC: 0 % (ref 0.0–0.2)
nRBC: 0 % (ref 0.0–0.2)

## 2018-10-23 LAB — GLUCOSE, CAPILLARY
Glucose-Capillary: >600 mg/dL (ref 70–99)
Glucose-Capillary: >600 mg/dL (ref 70–99)
Glucose-Capillary: 492 mg/dL — ABNORMAL HIGH (ref 70–99)
Glucose-Capillary: 426 mg/dL — ABNORMAL HIGH (ref 70–99)
Glucose-Capillary: 394 mg/dL — ABNORMAL HIGH (ref 70–99)
Glucose-Capillary: 276 mg/dL — ABNORMAL HIGH (ref 70–99)
Glucose-Capillary: 213 mg/dL — ABNORMAL HIGH (ref 70–99)
Glucose-Capillary: 196 mg/dL — ABNORMAL HIGH (ref 70–99)
Glucose-Capillary: 145 mg/dL — ABNORMAL HIGH (ref 70–99)
Glucose-Capillary: 114 mg/dL — ABNORMAL HIGH (ref 70–99)
Glucose-Capillary: 93 mg/dL (ref 70–99)

## 2018-10-23 LAB — MAGNESIUM
Magnesium: 2.6 mg/dL — ABNORMAL HIGH (ref 1.7–2.4)
Magnesium: 2.2 mg/dL (ref 1.7–2.4)
Magnesium: 2 mg/dL (ref 1.7–2.4)
Magnesium: 2 mg/dL (ref 1.7–2.4)

## 2018-10-23 LAB — PREGNANCY, URINE: Preg Test, Ur: NEGATIVE

## 2018-10-23 LAB — MRSA PCR SCREENING: MRSA by PCR: NEGATIVE

## 2018-10-23 MED ORDER — SODIUM CHLORIDE 0.9 % IV SOLN: INTRAVENOUS | Status: DC

## 2018-10-23 MED ORDER — LISINOPRIL 2.5 MG PO TABS
2.5000 mg | ORAL_TABLET | Freq: Every day | ORAL | Status: DC
  Administered 2018-10-23: 2.5 mg via ORAL
  Filled 2018-10-23: qty 1

## 2018-10-23 MED ORDER — LACTATED RINGERS IV BOLUS
2000.0000 mL | Freq: Once | INTRAVENOUS | Status: AC
  Administered 2018-10-23: 10:00:00 2000 mL via INTRAVENOUS

## 2018-10-23 MED ORDER — INSULIN REGULAR(HUMAN) IN NACL 100-0.9 UT/100ML-% IV SOLN: INTRAVENOUS | Status: DC

## 2018-10-23 MED ORDER — SODIUM CHLORIDE 0.9 % IV SOLN
INTRAVENOUS | Status: AC
  Administered 2018-10-23: 13:00:00 via INTRAVENOUS

## 2018-10-23 MED ORDER — DEXTROSE-NACL 5-0.45 % IV SOLN: INTRAVENOUS | Status: DC

## 2018-10-23 MED ORDER — CYCLOBENZAPRINE HCL 10 MG PO TABS: 10.0000 mg | ORAL_TABLET | Freq: Three times a day (TID) | ORAL | Status: DC | PRN

## 2018-10-23 MED ORDER — ACETAMINOPHEN 325 MG PO TABS
650.0000 mg | ORAL_TABLET | Freq: Four times a day (QID) | ORAL | Status: DC | PRN
  Administered 2018-10-23: 650 mg via ORAL
  Filled 2018-10-23: qty 2

## 2018-10-23 MED ORDER — METOCLOPRAMIDE HCL 10 MG PO TABS
10.0000 mg | ORAL_TABLET | Freq: Three times a day (TID) | ORAL | Status: DC
  Administered 2018-10-24 (×2): 10 mg via ORAL
  Filled 2018-10-23 (×2): qty 1

## 2018-10-23 MED ORDER — POTASSIUM CHLORIDE 10 MEQ/100ML IV SOLN
10.0000 meq | INTRAVENOUS | Status: AC
  Administered 2018-10-23 (×2): 10 meq via INTRAVENOUS
  Filled 2018-10-23: qty 100

## 2018-10-23 MED ORDER — ONDANSETRON HCL 4 MG/2ML IJ SOLN
4.0000 mg | Freq: Once | INTRAMUSCULAR | Status: AC
  Administered 2018-10-23: 10:00:00 4 mg via INTRAVENOUS
  Filled 2018-10-23: qty 2

## 2018-10-23 MED ORDER — INSULIN ASPART 100 UNIT/ML ~~LOC~~ SOLN: 0.0000 [IU] | Freq: Every day | SUBCUTANEOUS | Status: DC

## 2018-10-23 MED ORDER — POTASSIUM CHLORIDE 10 MEQ/100ML IV SOLN
10.0000 meq | INTRAVENOUS | Status: AC
  Filled 2018-10-23: qty 100

## 2018-10-23 MED ORDER — CIPROFLOXACIN IN D5W 400 MG/200ML IV SOLN
400.0000 mg | Freq: Once | INTRAVENOUS | Status: AC
  Administered 2018-10-23: 400 mg via INTRAVENOUS
  Filled 2018-10-23: qty 200

## 2018-10-23 MED ORDER — INSULIN GLARGINE 100 UNIT/ML ~~LOC~~ SOLN
22.0000 [IU] | Freq: Every day | SUBCUTANEOUS | Status: DC
  Administered 2018-10-23 – 2018-10-24 (×2): 22 [IU] via SUBCUTANEOUS
  Filled 2018-10-23 (×2): qty 0.22

## 2018-10-23 MED ORDER — INSULIN REGULAR(HUMAN) IN NACL 100-0.9 UT/100ML-% IV SOLN
INTRAVENOUS | Status: DC
  Administered 2018-10-23: 5.4 [IU]/h via INTRAVENOUS
  Filled 2018-10-23: qty 100

## 2018-10-23 MED ORDER — ENOXAPARIN SODIUM 40 MG/0.4ML ~~LOC~~ SOLN
40.0000 mg | SUBCUTANEOUS | Status: DC
  Administered 2018-10-23: 40 mg via SUBCUTANEOUS
  Filled 2018-10-23: qty 0.4

## 2018-10-23 MED ORDER — DEXTROSE-NACL 5-0.45 % IV SOLN
INTRAVENOUS | Status: DC
  Administered 2018-10-23: 16:00:00 via INTRAVENOUS

## 2018-10-23 MED ORDER — INSULIN ASPART 100 UNIT/ML ~~LOC~~ SOLN
0.0000 [IU] | Freq: Three times a day (TID) | SUBCUTANEOUS | Status: DC
  Administered 2018-10-24: 2 [IU] via SUBCUTANEOUS

## 2018-10-23 MED ORDER — LACTATED RINGERS IV SOLN
INTRAVENOUS | Status: DC
  Administered 2018-10-23: 21:00:00 100 mL/h via INTRAVENOUS

## 2018-10-23 NOTE — ED Notes (Signed)
Patient was assisted to bathroom by NT. When pt returned to the room, pt reported she had to "use the bathroom again" Pt reports she feels like she constantly had to go and requested to be placed on a bedpan as she "felt she could not walk again".

## 2018-10-23 NOTE — Progress Notes (Signed)
Inpatient Diabetes Program Recommendations  AACE/ADA: New Consensus Statement on Inpatient Glycemic Control (2015)  Target Ranges:  Prepandial:   less than 140 mg/dL      Peak postprandial:   less than 180 mg/dL (1-2 hours)      Critically ill patients:  140 - 180 mg/dL   Lab Results  Component Value Date   GLUCAP 213 (H) 10/23/2018   HGBA1C  06/04/2018     Comment:     >15    Review of Glycemic Control  Diabetes history: DM1 Outpatient Diabetes medications: Lantus 22 units QHS, Humalog 0-12 units tidwc Current orders for Inpatient glycemic control: IV insulin per DKA protocol  HgbA1C > 15.5% on 06/04/18 PCP - [facility] Will see pt in am. Not ready to talk about diabetes today. AG19  CO2 12 - improved to AG10  CO2 19. Lab glucose 230 mg/dL.  Inpatient Diabetes Program Recommendations:     Continue with insulin drip until criteria met for discontinuation. Give Lantus 1-2 hours prior to discontinuation of drip.  Lantus 12 units bid Novolog 0-9 units tidwc and 0-5 units QHS Novolog 5 units tidwc for meal coverage insulin. (Pt makes no insulin since DM1. Will require meal coverage. Update HgbA1C  Will need to f/u at [facility] for glycemic control.  Thank you. Lorenda Peck, RD, LDN, CDE Inpatient Diabetes Coordinator [phone]

## 2018-10-23 NOTE — ED Provider Notes (Signed)
[facility] DEPT Provider Note   CSN: [id number] Arrival date & time: 10/23/18  0093     History   Chief Complaint Chief Complaint  Patient presents with  . Hyperglycemia    HPI Ashley Freeman is a 28 y.o. female.     HPI   28 year old female presenting with she suspects that she may be in diabetic ketoacidosis.  She says that she has generally not felt well for the past week.  Acutely worsening symptoms yesterday.  Very nauseated.  Fatigued.  Polyuria and polydipsia.  She is a type I diabetic.  She reports only intermittent compliance with her medications.  No fevers.  No dysuria.  No cough or dyspnea.  Past Medical History:  Diagnosis Date  . Diabetes mellitus 2001   Diagnosed at age 39 ; Type I  . Diarrhea 05/30/2016  . DKA (diabetic ketoacidoses) ([place]) 08/19/2013  . DKA (diabetic ketoacidoses) ([place]) 05/30/2016  . Gonorrhea 08/2011   Treated in 09/2011    Patient Active Problem List   Diagnosis Date Noted  . DKA (diabetic ketoacidosis) ([place]) 12/29/2016  . Inguinal abscess   . History of trichomoniasis 05/31/2016  . Diarrhea 05/31/2016  . DKA (diabetic ketoacidoses) ([place]) 05/30/2016  . Hyperlipidemia 03/28/2016  . Diabetic neuropathy ([place]) 01/16/2016  . DKA, type 1 ([place]) 11/24/2015  . BV (bacterial vaginosis) 11/24/2015  . Chest pain 11/24/2015  . AKI (acute kidney injury) ([place]) 07/26/2014  . Infection of urinary tract 04/08/2014  . PID (acute pelvic inflammatory disease) 04/06/2014  . Diabetic ketoacidosis without coma associated with type 1 diabetes mellitus ([place]) 09/19/2013  . Infected cyst of Bartholin's gland duct 09/19/2013  . Bartholin's gland abscess 09/19/2013  . Sepsis ([place]) 09/19/2013  . Abscess, gluteal, right 08/24/2013  . Health care maintenance 08/24/2013  . Hypophosphatemia 08/18/2013  . Hypomagnesemia 08/18/2013  . Leukocytosis, unspecified 08/17/2013  . Fever, unspecified 08/17/2013  . H/O medication  noncompliance 08/12/2013  . Hyponatremia 08/04/2012  . Anemia 02/19/2012  . Hyperglycemia 11/06/2011  . Tachycardia 10/17/2011  . Diabetes mellitus type 1, uncontrolled, with complications ([place]) 81/82/9937    Past Surgical History:  Procedure Laterality Date  . INCISION AND DRAINAGE PERIRECTAL ABSCESS Right 08/18/2013   Procedure: IRRIGATION AND DEBRIDEMENT GLUTEAL ABSCESS;  Surgeon: Ralene Ok, MD;  Location: [facility];  Service: General;  Laterality: Right;  . INCISION AND DRAINAGE PERIRECTAL ABSCESS Right 09/19/2013   Procedure: IRRIGATION AND DEBRIDEMENT RIGHT GLUTEAL AND LABIAL ABSCESSES;  Surgeon: Ralene Ok, MD;  Location: [facility];  Service: General;  Laterality: Right;  . INCISION AND DRAINAGE PERIRECTAL ABSCESS Right 09/24/2013   Procedure: IRRIGATION AND DEBRIDEMENT PERIRECTAL ABSCESS;  Surgeon: Gwenyth Ober, MD;  Location: [facility];  Service: General;  Laterality: Right;     OB History    Gravida  1   Para  0   Term      Preterm      AB  1   Living  0     SAB  1   TAB      Ectopic      Multiple      Live Births               Home Medications    Prior to Admission medications   Medication Sig Start Date End Date Taking? Authorizing Provider  azithromycin (ZITHROMAX) 250 MG tablet Take 4 tabs orally once Patient not taking: Reported on 06/27/2018 06/06/18   Charlott Rakes, MD  blood glucose meter  kit and supplies KIT Dispense based on patient and insurance preference. Use up to four times daily as directed. (FOR ICD-9 250.00, 250.01). 05/25/17   Isla Pence, MD  cyclobenzaprine (FLEXERIL) 10 MG tablet Take 1 tablet (10 mg total) by mouth 3 (three) times daily as needed for muscle spasms. 06/04/18   Charlott Rakes, MD  HUMALOG KWIKPEN 100 UNIT/ML KwikPen INJECT 0-12 UNITS SUBCUTANEOUSLY THREE TIMES DAILY BEFORE MEALS AS PER SLIDING SCALE *MUST MAKE APPT FOR FURTHER REFILLS* 10/22/18   Charlott Rakes, MD  ibuprofen (ADVIL,MOTRIN) 200 MG tablet Take 400  mg by mouth daily as needed (pain).    [provider]  Insulin Glargine (LANTUS) 100 UNIT/ML Solostar Pen Inject 18 Units into the skin daily. 06/27/18   Shamleffer, Melanie Crazier, MD  lisinopril (PRINIVIL,ZESTRIL) 2.5 MG tablet Take 1 tablet (2.5 mg total) by mouth daily. 06/05/18   Charlott Rakes, MD  meloxicam (MOBIC) 7.5 MG tablet Take 1 tablet (7.5 mg total) by mouth daily. 06/04/18   Charlott Rakes, MD  metoCLOPramide (REGLAN) 10 MG tablet Take 1 tablet (10 mg total) by mouth 3 (three) times daily before meals. 06/04/18   Charlott Rakes, MD  metroNIDAZOLE (FLAGYL) 500 MG tablet Take 1 tablet (500 mg total) by mouth 2 (two) times daily. 07/28/18   Lorin Glass, PA-C  metroNIDAZOLE (METROGEL VAGINAL) 0.75 % vaginal gel Place 1 Applicatorful vaginally at bedtime. 06/09/18   Charlott Rakes, MD    Family History Family History  Problem Relation Age of Onset  . Asthma Mother   . Gout Father   . Diabetes Paternal Grandmother   . Anesthesia problems Neg Hx     Social History Social History   Tobacco Use  . Smoking status: Never Smoker  . Smokeless tobacco: Never Used  Substance Use Topics  . Alcohol use: Not Currently  . Drug use: No     Allergies   Penicillins, Benadryl [diphenhydramine], and Doxycycline   Review of Systems Review of Systems  All systems reviewed and negative, other than as noted in HPI.  Physical Exam Updated Vital Signs BP 94/66   Pulse (!) 111   Temp 98.5 F (36.9 C) (Oral)   Resp 16   LMP 09/30/2018   SpO2 98%   Physical Exam Vitals signs and nursing note reviewed.  Constitutional:      Appearance: She is well-developed.     Comments: Appears uncomfortable, but not toxic.  HENT:     Head: Normocephalic and atraumatic.  Eyes:     General:        Right eye: No discharge.        Left eye: No discharge.     Conjunctiva/sclera: Conjunctivae normal.  Neck:     Musculoskeletal: Neck supple.  Cardiovascular:     Rate and  Rhythm: Regular rhythm. Tachycardia present.     Heart sounds: Normal heart sounds. No murmur. No friction rub. No gallop.   Pulmonary:     Effort: Pulmonary effort is normal. No respiratory distress.     Breath sounds: Normal breath sounds.  Abdominal:     General: There is no distension.     Palpations: Abdomen is soft.     Tenderness: There is no abdominal tenderness.  Musculoskeletal:        General: No tenderness.  Skin:    General: Skin is warm and dry.  Neurological:     General: No focal deficit present.     Mental Status: She is alert.  Psychiatric:  Behavior: Behavior normal.        Thought Content: Thought content normal.      ED Treatments / Results  Labs (all labs ordered are listed, but only abnormal results are displayed) Labs Reviewed  CBC WITH DIFFERENTIAL/PLATELET - Abnormal; Notable for the following components:      Result Value   Abs Immature Granulocytes 0.08 (*)    All other components within normal limits  COMPREHENSIVE METABOLIC PANEL - Abnormal; Notable for the following components:   Sodium 127 (*)    Chloride 94 (*)    CO2 11 (*)    Glucose, Bld 783 (*)    BUN 24 (*)    Creatinine, Ser 1.30 (*)    GFR calc non Af Amer 57 (*)    Anion gap 22 (*)    All other components within normal limits  URINALYSIS, ROUTINE W REFLEX MICROSCOPIC - Abnormal; Notable for the following components:   APPearance HAZY (*)    Glucose, UA >=500 (*)    Hgb urine dipstick SMALL (*)    Ketones, ur 20 (*)    Leukocytes,Ua LARGE (*)    Bacteria, UA MANY (*)    All other components within normal limits  BLOOD GAS, ARTERIAL - Abnormal; Notable for the following components:   pH, Arterial 7.258 (*)    pCO2 arterial 25.0 (*)    Bicarbonate 10.8 (*)    Acid-base deficit 14.7 (*)    All other components within normal limits  MAGNESIUM - Abnormal; Notable for the following components:   Magnesium 2.6 (*)    All other components within normal limits  NOVEL  CORONAVIRUS, NAA (HOSPITAL ORDER, SEND-OUT TO REF LAB)  URINE CULTURE  PREGNANCY, URINE    EKG EKG Interpretation  Date/Time:  Thursday October 23 2018 09:18:39 EDT Ventricular Rate:  108 PR Interval:    QRS Duration: 80 QT Interval:  337 QTC Calculation: 452 R Axis:   64 Text Interpretation:  Sinus tachycardia Confirmed by Virgel Manifold [phone]) on 10/23/2018 10:10:36 AM   Radiology No results found.  Procedures Procedures (including critical care time)  CRITICAL CARE Performed by: Virgel Manifold Total critical care time: 35 minutes Critical care time was exclusive of separately billable procedures and treating other patients. Critical care was necessary to treat or prevent imminent or life-threatening deterioration. Critical care was time spent personally by me on the following activities: development of treatment plan with patient and/or surrogate as well as nursing, discussions with consultants, evaluation of patient's response to treatment, examination of patient, obtaining history from patient or surrogate, ordering and performing treatments and interventions, ordering and review of laboratory studies, ordering and review of radiographic studies, pulse oximetry and re-evaluation of patient's condition.   Medications Ordered in ED Medications  lactated ringers bolus 2,000 mL (has no administration in time range)  ondansetron (ZOFRAN) injection 4 mg (has no administration in time range)     Initial Impression / Assessment and Plan / ED Course  I have reviewed the triage vital signs and the nursing notes.  Pertinent labs & imaging results that were available during my care of the patient were reviewed by me and considered in my medical decision making (see chart for details).       28 year old female with symptomatic hyperglycemia, possible diabetic ketoacidosis.  She reports intermittent compliance with her medications which is the most likely explanation for her  hyperglycemia.  She is afebrile.  She appears somewhat uncomfortable but not toxic.  She is not  encephalopathic.  IV established.  We will start with IV fluid boluses.  Symptomatic treatment.  Labs including ABG and urinalysis.  May start insulin drip depending on lab results.  Electrolyte management and reassessment.  Labs confirm DKA. Additional IVF. Insulin gtt. K supplementation. UA with probably UTI. Urine culture. Abx. Admit.   Final Clinical Impressions(s) / ED Diagnoses   Final diagnoses:  DKA, type 1, not at goal [facility])    ED Discharge Orders    None       Virgel Manifold, MD 10/23/18 1048

## 2018-10-23 NOTE — ED Triage Notes (Signed)
Per EMS: Pt is coming in from home with a complaint of DKA. Pt's sugar with EMS was 589. PT has a hx of DKA.  PT is alert and oriented at this time.

## 2018-10-23 NOTE — ED Notes (Signed)
RN stuck pt x2 in attempt to obtain lab work, IV established but would not pull back blood.

## 2018-10-23 NOTE — H&P (Addendum)
History and Physical    Ashley Freeman CBU:[id number] DOB: 1991/04/02 DOA: 10/23/2018  PCP: Ashley Rakes, MD  Patient coming from: Home  I have personally briefly reviewed patient's old medical records in [place]  Chief Complaint: Nausea/vomiting  HPI: Ashley Freeman is a 28 y.o. female with medical history significant of type 1 diabetes mellitus with complications and noncompliance as well as hypertension presented to emergency department with a complaint of nausea/vomiting, polyuria and polydipsia since about last 2 days.  Patient admits that she has not been as compliant with her Lantus as she should be and has been skipping doses.  She also has associated chills.  She denies any fever, sweating, chest pain, shortness of breath, cough, any problem with bowel movement or any abdominal pain.  ED Course: Upon arrival to the emergency department, she was hemodynamically stable however further lab work revealed significant hyperglycemia and a diagnosis of DKA.  She also has mild AKI.  She was given IV fluids and started on insulin drip and hospital service was consulted to admit the patient for further management.  Review of Systems: As per HPI otherwise 10 point review of systems negative.    Past Medical History:  Diagnosis Date  . Diabetes mellitus 2001   Diagnosed at age 54 ; Type I  . Diarrhea 05/30/2016  . DKA (diabetic ketoacidoses) ([place]) 08/19/2013  . DKA (diabetic ketoacidoses) ([place]) 05/30/2016  . Gonorrhea 08/2011   Treated in 09/2011    Past Surgical History:  Procedure Laterality Date  . INCISION AND DRAINAGE PERIRECTAL ABSCESS Right 08/18/2013   Procedure: IRRIGATION AND DEBRIDEMENT GLUTEAL ABSCESS;  Surgeon: Ashley Ok, MD;  Location: [facility];  Service: General;  Laterality: Right;  . INCISION AND DRAINAGE PERIRECTAL ABSCESS Right 09/19/2013   Procedure: IRRIGATION AND DEBRIDEMENT RIGHT GLUTEAL AND LABIAL ABSCESSES;  Surgeon: Ashley Ok, MD;   Location: [facility];  Service: General;  Laterality: Right;  . INCISION AND DRAINAGE PERIRECTAL ABSCESS Right 09/24/2013   Procedure: IRRIGATION AND DEBRIDEMENT PERIRECTAL ABSCESS;  Surgeon: Ashley Ober, MD;  Location: [facility];  Service: General;  Laterality: Right;     reports that she has never smoked. She has never used smokeless tobacco. She reports previous alcohol use. She reports that she does not use drugs.  Allergies  Allergen Reactions  . Penicillins Hives and Rash    Has patient had a PCN reaction causing immediate rash, facial/tongue/throat swelling, SOB or lightheadedness with hypotension: Yes Has patient had a PCN reaction causing severe rash involving mucus membranes or skin necrosis: No Has patient had a PCN reaction that required hospitalization: Yes Has patient had a PCN reaction occurring within the last 10 years: No If all of the above answers are "NO", then may proceed with Cephalosporin use.  . Benadryl [Diphenhydramine] Itching  . Doxycycline Itching    Family History  Problem Relation Age of Onset  . Asthma Mother   . Gout Father   . Diabetes Paternal Grandmother   . Anesthesia problems Neg Hx     Prior to Admission medications   Medication Sig Start Date End Date Taking? Authorizing Provider  HUMALOG KWIKPEN 100 UNIT/ML KwikPen INJECT 0-12 UNITS SUBCUTANEOUSLY THREE TIMES DAILY BEFORE MEALS AS PER SLIDING SCALE MUST MAKE APPT FOR FURTHER REFILLS Patient taking differently: Inject 0-12 Units into the skin 3 (three) times daily before meals. Per sliding scale 10/22/18  Yes [place]lin, Charlane Ferretti, MD  Insulin Glargine (LANTUS) 100 UNIT/ML Solostar Pen Inject 18 Units into the  skin daily. Patient taking differently: Inject 22 Units into the skin at bedtime.  06/27/18  Yes Ashley Freeman Crazier, MD  lisinopril (PRINIVIL,ZESTRIL) 2.5 MG tablet Take 1 tablet (2.5 mg total) by mouth daily. 06/05/18  Yes Ashley Rakes, MD  azithromycin (ZITHROMAX) 250 MG tablet Take 4 tabs  orally once Patient not taking: Reported on 06/27/2018 06/06/18   Ashley Rakes, MD  blood glucose meter kit and supplies KIT Dispense based on patient and insurance preference. Use up to four times daily as directed. (FOR ICD-9 250.00, 250.01). 05/25/17   Ashley Pence, MD  cyclobenzaprine (FLEXERIL) 10 MG tablet Take 1 tablet (10 mg total) by mouth 3 (three) times daily as needed for muscle spasms. Patient not taking: Reported on 10/23/2018 06/04/18   Ashley Rakes, MD  meloxicam (MOBIC) 7.5 MG tablet Take 1 tablet (7.5 mg total) by mouth daily. Patient not taking: Reported on 10/23/2018 06/04/18   Ashley Rakes, MD  metoCLOPramide (REGLAN) 10 MG tablet Take 1 tablet (10 mg total) by mouth 3 (three) times daily before meals. Patient not taking: Reported on 10/23/2018 06/04/18   Ashley Rakes, MD  metroNIDAZOLE (FLAGYL) 500 MG tablet Take 1 tablet (500 mg total) by mouth 2 (two) times daily. Patient not taking: Reported on 10/23/2018 07/28/18   Ashley Glass, PA-C  metroNIDAZOLE (METROGEL VAGINAL) 0.75 % vaginal gel Place 1 Applicatorful vaginally at bedtime. Patient not taking: Reported on 10/23/2018 06/09/18   Ashley Rakes, MD    Physical Exam: Vitals:   10/23/18 0912 10/23/18 1012 10/23/18 1030 10/23/18 1100  BP: 94/66 113/77 108/63 117/66  Pulse: (!) 111 (!) 110    Resp: 16 19 (!) 25   Temp: 98.5 F (36.9 C)     TempSrc: Oral     SpO2: 98% 99%      Constitutional: NAD, calm, comfortable Vitals:   10/23/18 0912 10/23/18 1012 10/23/18 1030 10/23/18 1100  BP: 94/66 113/77 108/63 117/66  Pulse: (!) 111 (!) 110    Resp: 16 19 (!) 25   Temp: 98.5 F (36.9 C)     TempSrc: Oral     SpO2: 98% 99%     Eyes: PERRL, lids and conjunctivae normal, sick appearing ENMT: Mucous membranes are dry. Posterior pharynx clear of any exudate or lesions.Normal dentition.  Neck: normal, supple, no masses, no thyromegaly Respiratory: clear to auscultation bilaterally, no wheezing, no crackles.  Normal respiratory effort. No accessory muscle use.  Cardiovascular: Regular rate and rhythm, no murmurs / rubs / gallops. No extremity edema. 2+ pedal pulses. No carotid bruits.  Abdomen: Mild generalized tenderness, no masses palpated. No hepatosplenomegaly. Bowel sounds positive.  Musculoskeletal: no clubbing / cyanosis. No joint deformity upper and lower extremities. Good ROM, no contractures. Normal muscle tone.  Skin: no rashes, lesions, ulcers. No induration Neurologic: CN 2-12 grossly intact. Sensation intact, DTR normal. Strength 5/5 in all 4.  Psychiatric: Poor judgment and insight. Alert and oriented x 3. Normal mood.    Labs on Admission: I have personally reviewed following labs and imaging studies  CBC: Recent Labs  Lab 10/23/18 0941  WBC 10.4  NEUTROABS 7.0  HGB 12.6  HCT 40.2  MCV 86.6  PLT 696   Basic Metabolic Panel: Recent Labs  Lab 10/23/18 0941  NA 127*  K 4.4  CL 94*  CO2 11*  GLUCOSE 783*  BUN 24*  CREATININE 1.30*  CALCIUM 9.4  MG 2.6*   GFR: CrCl cannot be calculated (Unknown ideal weight.). Liver Function Tests: Recent Labs  Lab 10/23/18 0941  AST 22  ALT 20  ALKPHOS 81  BILITOT 1.2  PROT 7.9  ALBUMIN 3.8   No results for input(s): LIPASE, AMYLASE in the last 168 hours. No results for input(s): AMMONIA in the last 168 hours. Coagulation Profile: No results for input(s): INR, PROTIME in the last 168 hours. Cardiac Enzymes: No results for input(s): CKTOTAL, CKMB, CKMBINDEX, TROPONINI in the last 168 hours. BNP (last 3 results) No results for input(s): PROBNP in the last 8760 hours. HbA1C: No results for input(s): HGBA1C in the last 72 hours. CBG: No results for input(s): GLUCAP in the last 168 hours. Lipid Profile: No results for input(s): CHOL, HDL, LDLCALC, TRIG, CHOLHDL, LDLDIRECT in the last 72 hours. Thyroid Function Tests: No results for input(s): TSH, T4TOTAL, FREET4, T3FREE, THYROIDAB in the last 72 hours. Anemia Panel:  No results for input(s): VITAMINB12, FOLATE, FERRITIN, TIBC, IRON, RETICCTPCT in the last 72 hours. Urine analysis:    Component Value Date/Time   COLORURINE YELLOW 10/23/2018 0940   APPEARANCEUR HAZY (A) 10/23/2018 0940   APPEARANCEUR Hazy 11/10/2013 2043   LABSPEC 1.026 10/23/2018 0940   LABSPEC 1.031 11/10/2013 2043   PHURINE 5.0 10/23/2018 0940   GLUCOSEU >=500 (A) 10/23/2018 0940   GLUCOSEU >=500 11/10/2013 2043   HGBUR SMALL (A) 10/23/2018 0940   BILIRUBINUR NEGATIVE 10/23/2018 0940   BILIRUBINUR negative 06/04/2018 1035   BILIRUBINUR Negative 11/24/2015 1443   BILIRUBINUR Negative 11/10/2013 2043   KETONESUR 20 (A) 10/23/2018 0940   PROTEINUR NEGATIVE 10/23/2018 0940   UROBILINOGEN 0.2 06/04/2018 1035   UROBILINOGEN 1.0 03/12/2015 2039   NITRITE NEGATIVE 10/23/2018 0940   LEUKOCYTESUR LARGE (A) 10/23/2018 0940   LEUKOCYTESUR 1+ 11/10/2013 2043    Radiological Exams on Admission: No results found.  EKG: Independently reviewed.  Sinus tachycardia at 108 bpm.  No acute ST-T wave changes.  Assessment/Plan Active Problems:   Hyponatremia   Diabetic ketoacidosis without coma associated with type 1 diabetes mellitus ([place])    DKA and type 1 diabetes mellitus/noncompliance: At this point in time, we are going to admit the patient to stepdown unit and continue her on normal saline at 200 cc/h until her blood sugar gets below 250 and then she will be started on dextrose IV fluids.  She will be also started on insulin drip with DKA protocol.  Continue to check her BMP and magnesium every 4 hours.  Once her gap is closed, she will be started on diet and long-acting insulin with sliding scale insulin.  Hypertension?:  She seems to be on lisinopril at home however there is no mention of hypertension anywhere in the history.  I will resume her medication monitor closely.  AKI: Very mild elevation in creatinine likely secondary to dehydration due to DKA.  Hopefully this will resolve  with hydration.  Repeat labs in the morning.  Pseudohyponatremia: Currently 127 but this is likely due to hyperglycemia.  Corrected sodium is within normal limits.  Asymptomatic bacteriuria: Patient has polyuria and polydipsia but no dysuria.  She was diagnosed with UTI in the emergency department based on her symptoms and positive leukoesterase and was given a dose of antibiotics.  She has negative nitrites.  I do not think she has real UTI and instead her symptoms seem to be secondary to hyperglycemia so I will forego any antibiotics for now.  DVT prophylaxis: Lovenox Code Status: Full code Family Communication: None present at bedside.  Discussed in length with patient. Disposition Plan: Potential discharge in next  24 to 48 hours. Consults called: None Admission status: Observation   Darliss Cheney MD [facility] Pager [phone]  If 7PM-7AM, please contact night-coverage [email or web address] Password [facility]  10/23/2018, 11:38 AM

## 2018-10-24 ENCOUNTER — Telehealth: Payer: Self-pay | Admitting: Family Medicine

## 2018-10-24 DIAGNOSIS — E1010 Type 1 diabetes mellitus with ketoacidosis without coma: Secondary | ICD-10-CM | POA: Diagnosis not present

## 2018-10-24 DIAGNOSIS — N390 Urinary tract infection, site not specified: Secondary | ICD-10-CM | POA: Diagnosis not present

## 2018-10-24 DIAGNOSIS — N179 Acute kidney failure, unspecified: Secondary | ICD-10-CM | POA: Diagnosis not present

## 2018-10-24 DIAGNOSIS — E876 Hypokalemia: Secondary | ICD-10-CM

## 2018-10-24 LAB — BASIC METABOLIC PANEL
Anion gap: 10 (ref 5–15)
Anion gap: 8 (ref 5–15)
BUN: 16 mg/dL (ref 6–20)
BUN: 19 mg/dL (ref 6–20)
CO2: 18 mmol/L — ABNORMAL LOW (ref 22–32)
CO2: 21 mmol/L — ABNORMAL LOW (ref 22–32)
Calcium: 8.5 mg/dL — ABNORMAL LOW (ref 8.9–10.3)
Calcium: 8.6 mg/dL — ABNORMAL LOW (ref 8.9–10.3)
Chloride: 104 mmol/L (ref 98–111)
Chloride: 107 mmol/L (ref 98–111)
Creatinine, Ser: 0.79 mg/dL (ref 0.44–1.00)
Creatinine, Ser: 0.96 mg/dL (ref 0.44–1.00)
GFR calc Af Amer: >60 mL/min (ref >60)
GFR calc Af Amer: >60 mL/min (ref >60)
GFR calc non Af Amer: >60 mL/min (ref >60)
GFR calc non Af Amer: >60 mL/min (ref >60)
Glucose, Bld: 410 mg/dL — ABNORMAL HIGH (ref 70–99)
Glucose, Bld: 96 mg/dL (ref 70–99)
Potassium: 3.3 mmol/L — ABNORMAL LOW (ref 3.5–5.1)
Potassium: 4.5 mmol/L (ref 3.5–5.1)
Sodium: 132 mmol/L — ABNORMAL LOW (ref 135–145)
Sodium: 136 mmol/L (ref 135–145)

## 2018-10-24 LAB — CBC WITH DIFFERENTIAL/PLATELET
Abs Immature Granulocytes: 0.06 10*3/uL (ref 0.00–0.07)
Basophils Absolute: 0 10*3/uL (ref 0.0–0.1)
Basophils Relative: 0 %
Eosinophils Absolute: 0.4 10*3/uL (ref 0.0–0.5)
Eosinophils Relative: 3 %
HCT: 33.1 % — ABNORMAL LOW (ref 36.0–46.0)
Hemoglobin: 10.9 g/dL — ABNORMAL LOW (ref 12.0–15.0)
Immature Granulocytes: 1 %
Lymphocytes Relative: 29 %
Lymphs Abs: 3.6 10*3/uL (ref 0.7–4.0)
MCH: 27.5 pg (ref 26.0–34.0)
MCHC: 32.9 g/dL (ref 30.0–36.0)
MCV: 83.6 fL (ref 80.0–100.0)
Monocytes Absolute: 0.9 10*3/uL (ref 0.1–1.0)
Monocytes Relative: 7 %
Neutro Abs: 7.5 10*3/uL (ref 1.7–7.7)
Neutrophils Relative %: 60 %
Platelets: 305 10*3/uL (ref 150–400)
RBC: 3.96 MIL/uL (ref 3.87–5.11)
RDW: 13.5 % (ref 11.5–15.5)
WBC: 12.4 10*3/uL — ABNORMAL HIGH (ref 4.0–10.5)
nRBC: 0 % (ref 0.0–0.2)

## 2018-10-24 LAB — HEMOGLOBIN A1C
Hgb A1c MFr Bld: 16.8 % — ABNORMAL HIGH (ref 4.8–5.6)
Mean Plasma Glucose: 435.46 mg/dL

## 2018-10-24 LAB — URINE CULTURE: Culture: >=100000 — AB

## 2018-10-24 LAB — GLUCOSE, CAPILLARY
Glucose-Capillary: 308 mg/dL — ABNORMAL HIGH (ref 70–99)
Glucose-Capillary: 166 mg/dL — ABNORMAL HIGH (ref 70–99)
Glucose-Capillary: 68 mg/dL — ABNORMAL LOW (ref 70–99)

## 2018-10-24 LAB — NOVEL CORONAVIRUS, NAA (HOSP ORDER, SEND-OUT TO REF LAB; TAT 18-24 HRS): SARS-CoV-2, NAA: NOT DETECTED

## 2018-10-24 LAB — MAGNESIUM
Magnesium: 2 mg/dL (ref 1.7–2.4)
Magnesium: 2 mg/dL (ref 1.7–2.4)
Magnesium: 2.2 mg/dL (ref 1.7–2.4)
Magnesium: 2.3 mg/dL (ref 1.7–2.4)

## 2018-10-24 MED ORDER — CEPHALEXIN 500 MG PO CAPS: 500.0000 mg | ORAL_CAPSULE | Freq: Two times a day (BID) | ORAL | 0 refills | Status: AC

## 2018-10-24 MED ORDER — ALUM & MAG HYDROXIDE-SIMETH 200-200-20 MG/5ML PO SUSP
30.0000 mL | ORAL | Status: DC | PRN
  Administered 2018-10-24: 30 mL via ORAL
  Filled 2018-10-24: qty 30

## 2018-10-24 MED ORDER — SODIUM CHLORIDE 0.9 % IV SOLN
INTRAVENOUS | Status: DC
  Administered 2018-10-24: 05:00:00 via INTRAVENOUS

## 2018-10-24 MED ORDER — LACTATED RINGERS IV BOLUS
1000.0000 mL | Freq: Once | INTRAVENOUS | Status: AC
  Administered 2018-10-24: 11:00:00 1000 mL via INTRAVENOUS

## 2018-10-24 MED ORDER — INSULIN GLARGINE 100 UNIT/ML SOLOSTAR PEN: 15.0000 [IU] | PEN_INJECTOR | Freq: Two times a day (BID) | SUBCUTANEOUS | 0 refills | Status: DC

## 2018-10-24 MED ORDER — CEPHALEXIN 500 MG PO CAPS
500.0000 mg | ORAL_CAPSULE | Freq: Two times a day (BID) | ORAL | Status: DC
  Administered 2018-10-24: 500 mg via ORAL
  Filled 2018-10-24: qty 1

## 2018-10-24 MED ORDER — INSULIN ASPART 100 UNIT/ML ~~LOC~~ SOLN
7.0000 [IU] | Freq: Once | SUBCUTANEOUS | Status: AC
  Administered 2018-10-24: 7 [IU] via SUBCUTANEOUS

## 2018-10-24 MED ORDER — POTASSIUM CHLORIDE CRYS ER 20 MEQ PO TBCR
40.0000 meq | EXTENDED_RELEASE_TABLET | Freq: Once | ORAL | Status: AC
  Administered 2018-10-24: 40 meq via ORAL
  Filled 2018-10-24: qty 2

## 2018-10-24 NOTE — Telephone Encounter (Signed)
Patient needs to be seen on Monday is being relised today and needs fu labs

## 2018-10-24 NOTE — Discharge Summary (Addendum)
Discharge Summary  Ashley Freeman FBX:[id number] DOB: [birth year]  PCP: Charlott Rakes, MD  Admit date: 10/23/2018 Discharge date: 10/24/2018  Time spent: 88mns  Recommendations for Outpatient Follow-up:  1. F/u with PCP  in three days for hospital discharge follow up, repeat cbc/bmp at follow up. pcp to follow up on final urine culture result. 2. F/u with endocrinology, patient is advised to check blood glucose three times a day and bring in record for her doctor to review. 3. New meds: keflex  Discharge Diagnoses:  Active Hospital Problems   Diagnosis Date Noted  . DKA (diabetic ketoacidoses) ([place] 05/30/2016  . AKI (acute kidney injury) ([facility] 07/26/2014  . Diabetic ketoacidosis without coma associated with type 1 diabetes mellitus ([facility] 09/19/2013  . Hyponatremia 08/04/2012    Resolved Hospital Problems  No resolved problems to display.    Discharge Condition: stable  Diet recommendation: carb modified  Filed Weights   10/23/18 1423  Weight: 55.6 kg    History of present illness: ( per admitting MD Dr PDoristine Bosworth PCP: NCharlott Rakes MD  Patient coming from: Home  I have personally briefly reviewed patient's old medical records in [facility] Chief Complaint: Nausea/vomiting  HPI: Ashley DOSCHERis a 28y.o. female with medical history significant of type 1 diabetes mellitus with complications and noncompliance as well as hypertension presented to emergency department with a complaint of nausea/vomiting, polyuria and polydipsia since about last 2 days.  Patient admits that she has not been as compliant with her Lantus as she should be and has been skipping doses.  She also has associated chills.  She denies any fever, sweating, chest pain, shortness of breath, cough, any problem with bowel movement or any abdominal pain.  ED Course: Upon arrival to the emergency department, she was hemodynamically stable however further lab work revealed significant  hyperglycemia and a diagnosis of DKA.  She also has mild AKI.  She was given IV fluids and started on insulin drip and hospital service was consulted to admit the patient for further management.  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, uncontrolled -noncompliant with meds -a1c16.8 -diabetes education provided -she reports she gets hypoglycemia in the early morning if she takes lantus 30units at night, so in the last three months she has been doing lantus 20unit one time at night -advise start lantus 15units bid to avoid lows and achieve better control of blood glucose, patient to follow up with pcp and endocrinology for diabetes control  -she states her endocrinology has discussed with her about continuous blood glucose monitoring -she is continued on home meds lisinopril 2.532mdaily for renal protection   Hypokalemia: -replaced.  AKI: Cr 1.3 on presentation, cr normalized with hydration  Possible UTI with urinary frequency and persistent leukocytosis after hydration Will treat empirically with keflex for three days Urine culture in process F/u with pcp  Procedures:  none  Consultations:  Diabetes RN  Discharge Exam: BP 136/87   Pulse (!) 110  Temp 97.6 F (36.4 C) (Oral)   Resp 13   Ht '5\' 2"'  (1.575 m)   Wt 55.6 kg   LMP 09/30/2018   SpO2 100%   BMI 22.42 kg/m   General: NAD Cardiovascular: RRR Respiratory: CTABL  Discharge Instructions You were cared for  by a hospitalist during your hospital stay. If you have any questions about your discharge medications or the care you received while you were in the hospital after you are discharged, you can call the unit and asked to speak with the hospitalist on call if the hospitalist that took care of you is not available. Once you are discharged, your primary care physician will handle any further medical issues. Please note that NO REFILLS for any discharge medications will be authorized once you are discharged, as it is imperative that you return to your primary care physician (or establish a relationship with a primary care physician if you do not have one) for your aftercare needs so that they can reassess your need for medications and monitor your lab values.  Discharge Instructions    Diet Carb Modified   Complete by: As directed    Please ensure adequate hydration   Increase activity slowly   Complete by: As directed      Allergies as of 10/24/2018      Reactions   Penicillins Hives, Rash   Has patient had a PCN reaction causing immediate rash, facial/tongue/throat swelling, SOB or lightheadedness with hypotension: Yes Has patient had a PCN reaction causing severe rash involving mucus membranes or skin necrosis: No Has patient had a PCN reaction that required hospitalization: Yes Has patient had a PCN reaction occurring within the last 10 years: No If all of the above answers are "NO", then may proceed with Cephalosporin use.   Benadryl [diphenhydramine] Itching   Doxycycline Itching      Medication List    STOP taking these medications   azithromycin 250 MG tablet Commonly known as: ZITHROMAX   cyclobenzaprine 10 MG tablet Commonly known as: FLEXERIL   meloxicam 7.5 MG tablet Commonly known as: MOBIC   metoCLOPramide 10 MG tablet Commonly known as: REGLAN   metroNIDAZOLE 0.75 % vaginal gel Commonly known as: METROGEL VAGINAL   metroNIDAZOLE 500 MG tablet Commonly known as:  FLAGYL     TAKE these medications   blood glucose meter kit and supplies Kit Dispense based on patient and insurance preference. Use up to four times daily as directed. (FOR ICD-9 250.00, 250.01).   cephALEXin 500 MG capsule Commonly known as: KEFLEX Take 1 capsule (500 mg total) by mouth every 12 (twelve) hours for 3 days.   HumaLOG KwikPen 100 UNIT/ML KwikPen Generic drug: insulin lispro INJECT 0-12 UNITS SUBCUTANEOUSLY THREE TIMES DAILY BEFORE MEALS AS PER SLIDING SCALE , MUST MAKE APPT FOR FURTHER REFILLS What changed: See the new instructions.   Insulin Glargine 100 UNIT/ML Solostar Pen Commonly known as: LANTUS Inject 15 Units into the skin 2 (two) times daily. What changed:   how much to take  when to take this   lisinopril 2.5 MG tablet Commonly known as: ZESTRIL Take 1 tablet (2.5 mg total) by mouth daily.      Allergies  Allergen Reactions  . Penicillins Hives and Rash    Has patient had a PCN reaction causing immediate rash, facial/tongue/throat swelling, SOB or lightheadedness with hypotension: Yes Has patient had a PCN reaction causing severe rash involving mucus membranes or skin necrosis: No Has patient had a PCN  reaction that required hospitalization: Yes Has patient had a PCN reaction occurring within the last 10 years: No If all of the above answers are "NO", then may proceed with Cephalosporin use.  . Benadryl [Diphenhydramine] Itching  . Doxycycline Itching   Follow-up Information    Charlott Rakes, MD Follow up on 10/27/2018.   Specialty: Family Medicine Why: hospital discharge follow up, repeat cbc/bmp at follow up, pcp to follow up on final urine cuture result. please check your blood glucose three times a day and bring in records for your doctor to review, goal of a1c is less than 7%. Contact information: [place] [address] [phone]        please follow up with endocrinology as well Follow up.             The results of significant diagnostics from this hospitalization (including imaging, microbiology, ancillary and laboratory) are listed below for reference.    Significant Diagnostic Studies: No results found.  Microbiology: Recent Results (from the past 240 hour(s))  Urine culture     Status: Abnormal   Collection Time: 10/23/18  9:04 AM   Specimen: Urine, Random  Result Value Ref Range Status   Specimen Description   Final    URINE, RANDOM Performed at [facility] [address], [place], [place] [address]    Special Requests   Final    NONE Performed at [facility], [place] [address] [place], [place] [address]    Culture (A)  Final    >=100,000 COLONIES/mL GROUP B STREP(S.AGALACTIAE)ISOLATED TESTING AGAINST S. AGALACTIAE NOT ROUTINELY PERFORMED DUE TO PREDICTABILITY OF AMP/PEN/VAN SUSCEPTIBILITY. Performed at [facility], [place] [address] [place], [place] [address]    Report Status 10/24/2018 FINAL  Final  Novel Coronavirus,NAA,(SEND-OUT TO REF LAB - TAT 24-48 hrs); Hosp Order     Status: None   Collection Time: 10/23/18  9:24 AM   Specimen: Nasopharyngeal Swab; Respiratory  Result Value Ref Range Status   SARS-CoV-2, NAA NOT DETECTED NOT DETECTED Final    Comment: (NOTE) This test was developed and its performance characteristics determined by [employer]. This test has not been FDA cleared or approved. This test has been authorized by FDA under an Emergency Use Authorization (EUA). This test is only authorized for the duration of time the declaration that circumstances exist justifying the authorization of the emergency use of in vitro diagnostic tests for detection of SARS-CoV-2 virus and/or diagnosis of COVID-19 infection under section 564(b)(1) of the Act, 21 U.S.C. 063KZS-0(F)(0), unless the authorization is terminated or revoked sooner. When diagnostic testing is negative, the possibility of a false negative  result should be considered in the context of a patient's recent exposures and the presence of clinical signs and symptoms consistent with COVID-19. An individual without symptoms of COVID-19 and who is not shedding SARS-CoV-2 virus would expect to have a negative (not detected) result in this assay. Performed  At: [facility] [place], [place] [id number] Rush Farmer MD KG:[id number]    [place]  Final    Comment: Performed at [facility] [address], [place], [place] [address]  MRSA PCR Screening     Status: None   Collection Time: 10/23/18 12:34 PM   Specimen: Nasal Mucosa; Nasopharyngeal  Result Value Ref Range Status   MRSA by PCR NEGATIVE NEGATIVE Final    Comment:        The GeneXpert MRSA Assay (  FDA approved for NASAL specimens only), is one component of a comprehensive MRSA colonization surveillance program. It is not intended to diagnose MRSA infection nor to guide or monitor treatment for MRSA infections. Performed at [facility], [place] [address] [place], [place] [address]      Labs: Basic Metabolic Panel: Recent Labs  Lab 10/23/18 1248 10/23/18 1630 10/23/18 1940 10/24/18 0018 10/24/18 0510 10/24/18 0900 10/24/18 1213  NA 134* 138 138 132*  --  136  --   K 3.8 3.7 3.6 4.5  --  3.3*  --   CL 103 109 109 104  --  107  --   CO2 12* 19* 22 18*  --  21*  --   GLUCOSE 529* 230* 128* 410*  --  96  --   BUN 24* '20 18 19  ' --  16  --   CREATININE 1.21* 0.92 0.85 0.96  --  0.79  --   CALCIUM 9.3 9.1 8.8* 8.5*  --  8.6*  --   MG 2.2 2.0 2.0 2.0 2.2 2.3 2.0   Liver Function Tests: Recent Labs  Lab 10/23/18 0941  AST 22  ALT 20  ALKPHOS 81  BILITOT 1.2  PROT 7.9  ALBUMIN 3.8   No results for input(s): LIPASE, AMYLASE in the last 168 hours. No results for input(s): AMMONIA in the last 168 hours. CBC: Recent Labs  Lab 10/23/18 0941 10/23/18 1248 10/24/18 0900   WBC 10.4 12.5* 12.4*  NEUTROABS 7.0 10.0* 7.5  HGB 12.6 11.4* 10.9*  HCT 40.2 35.3* 33.1*  MCV 86.6 84.4 83.6  PLT 326 324 305   Cardiac Enzymes: No results for input(s): CKTOTAL, CKMB, CKMBINDEX, TROPONINI in the last 168 hours. BNP: BNP (last 3 results) No results for input(s): BNP in the last 8760 hours.  ProBNP (last 3 results) No results for input(s): PROBNP in the last 8760 hours.  CBG: Recent Labs  Lab 10/23/18 1935 10/23/18 2041 10/24/18 0518 10/24/18 0739 10/24/18 1156  GLUCAP 114* 93 308* 166* 68*       Signed:  Florencia Reasons MD, PhD  [facility] 10/24/2018, 3:41 PM

## 2018-10-27 ENCOUNTER — Other Ambulatory Visit: Payer: Self-pay

## 2018-10-27 ENCOUNTER — Ambulatory Visit: Payer: BLUE CROSS/BLUE SHIELD | Attending: Family Medicine | Admitting: Family Medicine

## 2018-10-27 ENCOUNTER — Encounter: Payer: Self-pay | Admitting: Family Medicine

## 2018-10-27 VITALS — BP 101/68 | HR 114 | Temp 98.3°F | Ht 63.0 in | Wt 134.0 lb

## 2018-10-27 DIAGNOSIS — E876 Hypokalemia: Secondary | ICD-10-CM | POA: Diagnosis not present

## 2018-10-27 DIAGNOSIS — E1065 Type 1 diabetes mellitus with hyperglycemia: Secondary | ICD-10-CM | POA: Diagnosis not present

## 2018-10-27 DIAGNOSIS — D649 Anemia, unspecified: Secondary | ICD-10-CM

## 2018-10-27 DIAGNOSIS — R000 Tachycardia, unspecified: Secondary | ICD-10-CM | POA: Diagnosis not present

## 2018-10-27 DIAGNOSIS — IMO0002 Reserved for concepts with insufficient information to code with codable children: Secondary | ICD-10-CM

## 2018-10-27 LAB — GLUCOSE, POCT (MANUAL RESULT ENTRY): POC Glucose: 55 mg/dl — AB (ref 70–99)

## 2018-10-27 MED ORDER — LISINOPRIL 2.5 MG PO TABS: 2.5000 mg | ORAL_TABLET | Freq: Every day | ORAL | 3 refills | Status: DC

## 2018-10-27 NOTE — Progress Notes (Signed)
Subjective:  Patient ID: Ashley Freeman, female    DOB: 08-Jan-1991  Age: 28 y.o. MRN: [id number]  CC: Hospitalization Follow-up   HPI Ashley Freeman  is a 28 year old female with a history of type 1 diabetes mellitus (A1c 16.8), diabetic neuropathy presents today for follow-up visit after hospitalization at [facility] from 10/23/2018 through 10/24/2018 for DKA. She had admitted noncompliance to medications ever since her grandmother passed away and was also found to have a UTI on admission for which she was commenced on Keflex. Her Lantus regimen was switched from 20 units daily to 50 units 2 times daily.  She presents today with a blood sugar log which reveals random sugars in the 242 290 range and fasting sugars between 70 and 130 and she endorses compliance with her medications.  [facility]. She has had knots in her neck for years and is wondering what this is.  Past Medical History:  Diagnosis Date  . Diabetes mellitus 2001   Diagnosed at age 76 ; Type I  . Diarrhea 05/30/2016  . DKA (diabetic ketoacidoses) ([place]) 08/19/2013  . DKA (diabetic ketoacidoses) ([place]) 05/30/2016  . Gonorrhea 08/2011   Treated in 09/2011    Past Surgical History:  Procedure Laterality Date  . INCISION AND DRAINAGE PERIRECTAL ABSCESS Right 08/18/2013   Procedure: IRRIGATION AND DEBRIDEMENT GLUTEAL ABSCESS;  Surgeon: Ralene Ok, MD;  Location: [facility];  Service: General;  Laterality: Right;  . INCISION AND DRAINAGE PERIRECTAL ABSCESS Right 09/19/2013   Procedure: IRRIGATION AND DEBRIDEMENT RIGHT GLUTEAL AND LABIAL ABSCESSES;  Surgeon: Ralene Ok, MD;  Location: [facility];  Service: General;  Laterality: Right;  . INCISION AND DRAINAGE PERIRECTAL ABSCESS Right 09/24/2013   Procedure: IRRIGATION AND DEBRIDEMENT PERIRECTAL ABSCESS;  Surgeon: Gwenyth Ober, MD;  Location: [facility];  Service: General;  Laterality: Right;    Family History  Problem Relation Age of  Onset  . Asthma Mother   . Gout Father   . Diabetes Paternal Grandmother   . Anesthesia problems Neg Hx       ROS Review of Systems  Constitutional: Negative for activity change, appetite change and fatigue.  HENT: Negative for congestion, sinus pressure and sore throat.   Eyes: Negative for visual disturbance.  Respiratory: Negative for cough, chest tightness, shortness of breath and wheezing.   Cardiovascular: Negative for chest pain and palpitations.  Gastrointestinal: Negative for abdominal distention, abdominal pain and constipation.  Endocrine: Negative for polydipsia.  Genitourinary: Negative for dysuria and frequency.  Musculoskeletal: Negative for arthralgias and back pain.  Skin: Negative for rash.  Neurological: Negative for tremors, light-headedness and numbness.  Hematological: Does not bruise/bleed easily.  Psychiatric/Behavioral: Negative for agitation and behavioral problems.    Objective:  BP 101/68   Pulse (!) 114   Temp 98.3 F (36.8 C) (Oral)   Ht 5\' 3"  (1.6 m)   Wt 134 lb (60.8 kg)   LMP 09/30/2018   SpO2 100%   BMI 23.74 kg/m   BP/Weight 10/27/2018 10/24/2018 4/40/1027  Systolic BP 253 664 -  Diastolic BP 68 87 -  Wt. (Lbs) 134 - 122.58  BMI 23.74 - 22.42      Physical Exam Constitutional:      Appearance: She is well-developed.  Cardiovascular:     Rate and Rhythm: Tachycardia present.     Heart sounds: Normal heart sounds. No murmur.  Pulmonary:     Effort: Pulmonary effort is normal.  Breath sounds: Normal breath sounds. No wheezing or rales.  Chest:     Chest wall: No tenderness.  Abdominal:     General: Bowel sounds are normal. There is no distension.     Palpations: Abdomen is soft. There is no mass.     Tenderness: There is no abdominal tenderness.  Musculoskeletal: Normal range of motion.  Lymphadenopathy:     Cervical: Cervical adenopathy (left posterior; right post auricular) present.  Neurological:     Mental Status:  She is alert and oriented to person, place, and time.  Psychiatric:        Mood and Affect: Mood normal.     CMP Latest Ref Rng & Units 10/24/2018 10/24/2018 10/23/2018  Glucose 70 - 99 mg/dL 96 410(H) 128(H)  BUN 6 - 20 mg/dL 16 19 18   Creatinine 0.44 - 1.00 mg/dL 0.79 0.96 0.85  Sodium 135 - 145 mmol/L 136 132(L) 138  Potassium 3.5 - 5.1 mmol/L 3.3(L) 4.5 3.6  Chloride 98 - 111 mmol/L 107 104 109  CO2 22 - 32 mmol/L 21(L) 18(L) 22  Calcium 8.9 - 10.3 mg/dL 8.6(L) 8.5(L) 8.8(L)  Total Protein 6.5 - 8.1 g/dL - - -  Total Bilirubin 0.3 - 1.2 mg/dL - - -  Alkaline Phos 38 - 126 U/L - - -  AST 15 - 41 U/L - - -  ALT 0 - 44 U/L - - -    Lipid Panel     Component Value Date/Time   CHOL 272 (H) 03/27/2016 0917   TRIG 163 (H) 03/27/2016 0917   HDL 47 (L) 03/27/2016 0917   CHOLHDL 5.8 (H) 03/27/2016 0917   VLDL 33 (H) 03/27/2016 0917   LDLCALC 192 (H) 03/27/2016 0917    CBC    Component Value Date/Time   WBC 12.4 (H) 10/24/2018 0900   RBC 3.96 10/24/2018 0900   HGB 10.9 (L) 10/24/2018 0900   HGB 12.8 11/11/2013 0431   HCT 33.1 (L) 10/24/2018 0900   HCT 40.2 11/11/2013 0431   PLT 305 10/24/2018 0900   PLT 272 11/11/2013 0431   MCV 83.6 10/24/2018 0900   MCV 80 11/11/2013 0431   MCH 27.5 10/24/2018 0900   MCHC 32.9 10/24/2018 0900   RDW 13.5 10/24/2018 0900   RDW 14.8 (H) 11/11/2013 0431   LYMPHSABS 3.6 10/24/2018 0900   LYMPHSABS 3.7 (H) 11/11/2013 0431   MONOABS 0.9 10/24/2018 0900   MONOABS 0.5 11/11/2013 0431   EOSABS 0.4 10/24/2018 0900   EOSABS 0.1 11/11/2013 0431   BASOSABS 0.0 10/24/2018 0900   BASOSABS 0.1 11/11/2013 0431    Lab Results  Component Value Date   HGBA1C 16.8 (H) 10/24/2018    Assessment & Plan:   1. Diabetes mellitus type 1, uncontrolled, with complications ([place]) Uncontrolled with A1c of 16.8 Poor control due to noncompliance as a result her recent bereavement Review of blood sugars indicate improvement Followed by [facility]  and encouraged to schedule an appointment - POCT glucose (manual entry)  2. Anemia, unspecified type - Iron, TIBC and Ferritin Panel  3. Hypokalemia - Basic Metabolic Panel  4. Tachycardia She has chronic tachycardia We will need to evaluate with thyroid labs - T4, free - TSH  She does have right preauricular lymphadenopathy and left posterior cervical lymphadenopathy but there are no other significantly enlarged lymph nodes.  Counseled regarding possible etiologies and we will be monitoring this for any change in size or presence of additional lymphadenopathy.  Meds ordered this encounter  Medications  . lisinopril (ZESTRIL) 2.5 MG tablet    Sig: Take 1 tablet (2.5 mg total) by mouth daily.    Dispense:  30 tablet    Refill:  3    Follow-up: Return in about 3 weeks (around 11/17/2018) for PAP smear.       Charlott Rakes, MD, FAAFP. [facility] [place], [place]   10/27/2018, 3:22 PM

## 2018-10-28 LAB — IRON,TIBC AND FERRITIN PANEL
Ferritin: 67 ng/mL (ref 15–150)
Iron Saturation: 20 % (ref 15–55)
Iron: 51 ug/dL (ref 27–159)
Total Iron Binding Capacity: 250 ug/dL (ref 250–450)
UIBC: 199 ug/dL (ref 131–425)

## 2018-10-28 LAB — BASIC METABOLIC PANEL
BUN/Creatinine Ratio: 23 (ref 9–23)
BUN: 18 mg/dL (ref 6–20)
CO2: 19 mmol/L — ABNORMAL LOW (ref 20–29)
Calcium: 9.1 mg/dL (ref 8.7–10.2)
Chloride: 105 mmol/L (ref 96–106)
Creatinine, Ser: 0.79 mg/dL (ref 0.57–1.00)
GFR calc Af Amer: 119 mL/min/{1.73_m2} (ref >59)
GFR calc non Af Amer: 104 mL/min/{1.73_m2} (ref >59)
Glucose: 116 mg/dL — ABNORMAL HIGH (ref 65–99)
Potassium: 4.4 mmol/L (ref 3.5–5.2)
Sodium: 137 mmol/L (ref 134–144)

## 2018-10-28 LAB — T4, FREE: Free T4: 0.98 ng/dL (ref 0.82–1.77)

## 2018-10-28 LAB — TSH: TSH: 1.98 u[IU]/mL (ref 0.450–4.500)

## 2018-11-04 ENCOUNTER — Emergency Department (HOSPITAL_COMMUNITY)
Admission: EM | Admit: 2018-11-04 | Discharge: 2018-11-04 | Discharge status: Against Medical Advice | Payer: BLUE CROSS/BLUE SHIELD | Attending: Emergency Medicine | Admitting: Emergency Medicine

## 2018-11-04 ENCOUNTER — Encounter (HOSPITAL_COMMUNITY): Payer: Self-pay | Admitting: Emergency Medicine

## 2018-11-04 ENCOUNTER — Other Ambulatory Visit: Payer: Self-pay

## 2018-11-04 DIAGNOSIS — R51 Headache: Secondary | ICD-10-CM | POA: Diagnosis present

## 2018-11-04 DIAGNOSIS — Z5321 Procedure and treatment not carried out due to patient leaving prior to being seen by health care provider: Secondary | ICD-10-CM | POA: Diagnosis not present

## 2018-11-04 NOTE — ED Triage Notes (Signed)
Pt c/o headache x 2 weeks. A&O x 4, ambulatory without difficulty, no weakness noted. Hx diabetes.

## 2018-11-04 NOTE — ED Notes (Signed)
Pt came to desk and states she can not wait any longer.

## 2018-11-06 ENCOUNTER — Ambulatory Visit: Payer: BLUE CROSS/BLUE SHIELD | Admitting: Internal Medicine

## 2018-11-25 ENCOUNTER — Ambulatory Visit: Payer: BLUE CROSS/BLUE SHIELD | Admitting: Family Medicine

## 2018-11-25 ENCOUNTER — Other Ambulatory Visit: Payer: Self-pay

## 2018-11-26 NOTE — Progress Notes (Signed)
Name: Ashley Freeman  Age/ Sex: 28 y.o., female   MRN/ DOB: [id number], 1990/10/15     PCP: Hoy Register, MD   Reason for Endocrinology Evaluation: Type 2 Diabetes Mellitus  Initial Endocrine Consultative Visit: 06/27/18    PATIENT IDENTIFIER: [name] is a 28 y.o. female with a past medical history of T1DM with multiple DKA's. The patient has followed with Endocrinology clinic since 06/27/2018 for consultative assistance with management of her diabetes.  DIABETIC HISTORY:  Ashley Freeman was diagnosed with T1DM at age 7. Hx of multiple DKA's due to non-compliance. Her hemoglobin A1c has ranged from  in 11.3% in 2011, peaking at 19.3% in 2015.   Works at [employer]  and senior living    SUBJECTIVE:   During the last visit (06/27/2018): A1c > 15.5%. We gave her a standing dose of humalog and changed lantus  Dose.   Today (11/27/2018): Ashley Freeman is here for a follow up on diabetes management. She has not been here in 5 months, She was recently admitted for a DKA.  She has been doing better in the past few weeks. She checks her blood sugars 3-4 times daily, preprandial. The patient has had occasional hypoglycemic episodes since the last clinic visit, which typically occur in the morning. The patient is symptomatic with these episodes.   ROS: As per HPI and as detailed below: Review of Systems  Constitutional: Negative for chills and fever.  HENT: Negative for congestion and sore throat.   Gastrointestinal: Negative for diarrhea and nausea.  Neurological: Positive for dizziness and headaches. Negative for tingling and tremors.      HOME DIABETES REGIMEN:   Lantus to 18 units daily- takes 15 units BID  Humalog at 6 units 3 times daily with meals- used sliding scale, if BG < 150 she doesn't take any prandial      GLUCOSE LOG:  Date  Breakfast  Lunch  Supper Bedtime  11/25/18 157 125 230 151  7/27 137 133 220 109  7/26 85 124 121   7/25 127 175  106 163  7/24 183 184 163   7/23 220 156 106 175  7/22 193 180 73 111  7/21 220 89 208 198  7/20 326 72 119 176     HISTORY:  Past Medical History:  Past Medical History:  Diagnosis Date  . Diabetes mellitus 2001   Diagnosed at age 55 ; Type I  . Diarrhea 05/30/2016  . DKA (diabetic ketoacidoses) (HCC) 08/19/2013  . DKA (diabetic ketoacidoses) (HCC) 05/30/2016  . Gonorrhea 08/2011   Treated in 09/2011   Past Surgical History:  Past Surgical History:  Procedure Laterality Date  . INCISION AND DRAINAGE PERIRECTAL ABSCESS Right 08/18/2013   Procedure: IRRIGATION AND DEBRIDEMENT GLUTEAL ABSCESS;  Surgeon: Axel Filler, MD;  Location: [facility] OR;  Service: General;  Laterality: Right;  . INCISION AND DRAINAGE PERIRECTAL ABSCESS Right 09/19/2013   Procedure: IRRIGATION AND DEBRIDEMENT RIGHT GLUTEAL AND LABIAL ABSCESSES;  Surgeon: Axel Filler, MD;  Location: [facility] OR;  Service: General;  Laterality: Right;  . INCISION AND DRAINAGE PERIRECTAL ABSCESS Right 09/24/2013   Procedure: IRRIGATION AND DEBRIDEMENT PERIRECTAL ABSCESS;  Surgeon: Cherylynn Ridges, MD;  Location: [facility] OR;  Service: General;  Laterality: Right;    Social History:  reports that she has never smoked. She has never used smokeless tobacco. She reports previous alcohol use. She reports that she does not use drugs. Family History:  Family History  Problem Relation Age of  Onset  . Asthma Mother   . Gout Father   . Diabetes Paternal Grandmother   . Anesthesia problems Neg Hx      HOME MEDICATIONS: Allergies as of 11/27/2018      Reactions   Penicillins Hives, Rash   Has patient had a PCN reaction causing immediate rash, facial/tongue/throat swelling, SOB or lightheadedness with hypotension: Yes Has patient had a PCN reaction causing severe rash involving mucus membranes or skin necrosis: No Has patient had a PCN reaction that required hospitalization: Yes Has patient had a PCN reaction occurring within the last 10 years: No  If all of the above answers are "NO", then may proceed with Cephalosporin use.   Benadryl [diphenhydramine] Itching   Doxycycline Itching      Medication List       Accurate as of November 27, 2018  1:44 PM. If you have any questions, ask your nurse or doctor.        STOP taking these medications   HumaLOG KwikPen 100 UNIT/ML KwikPen Generic drug: insulin lispro Replaced by: insulin lispro 100 UNIT/ML cartridge Stopped by: Scarlette Shorts, MD     TAKE these medications   blood glucose meter kit and supplies Kit Dispense based on patient and insurance preference. Use up to four times daily as directed. (FOR ICD-9 250.00, 250.01).   Dexcom G6 Receiver Devi 1 Device by Does not apply route as directed. Started by: Scarlette Shorts, MD   Dexcom G6 Sensor Misc 1 Device by Does not apply route as directed. Started by: Scarlette Shorts, MD   Dexcom G6 Transmitter Misc 1 Device by Does not apply route as directed. Started by: Scarlette Shorts, MD   InPen 100-Pink-Lilly Devi Generic drug: injection device for insulin 1 Device by Other route once for 1 dose. Started by: Scarlette Shorts, MD   Insulin Glargine 100 UNIT/ML Solostar Pen Commonly known as: LANTUS Inject 24 Units into the skin daily. What changed:   how much to take  when to take this Changed by: Scarlette Shorts, MD   insulin lispro 100 UNIT/ML cartridge Commonly known as: HUMALOG Inject 0.06 mLs (6 Units total) into the skin 3 (three) times daily with meals. Replaces: HumaLOG KwikPen 100 UNIT/ML KwikPen Started by: Scarlette Shorts, MD   lisinopril 2.5 MG tablet Commonly known as: ZESTRIL Take 1 tablet (2.5 mg total) by mouth daily.        OBJECTIVE:   Vital Signs: BP 116/82 (BP Location: Left Arm, Patient Position: Sitting, Cuff Size: Normal)   Pulse 62   Temp 98.8 F (37.1 C) (Oral)   Ht 5\' 3"  (1.6 m)   Wt 133 lb 9.6 oz (60.6 kg)   SpO2 96%   BMI 23.67 kg/m    Wt Readings from Last 3 Encounters:  11/27/18 133 lb 9.6 oz (60.6 kg)  10/27/18 134 lb (60.8 kg)  10/23/18 122 lb 9.2 oz (55.6 kg)     Exam: General: Pt appears well and is in NAD  Neck: General: Supple without adenopathy. Thyroid: Thyroid size normal.  No goiter or nodules appreciated. No thyroid bruit.  Lungs: Clear with good BS bilat with no rales, rhonchi, or wheezes  Heart: RRR with normal S1 and S2 and no gallops; no murmurs; no rub  Abdomen: Normoactive bowel sounds, soft, nontender, without masses or organomegaly palpable  Extremities: No pretibial edema. No tremor.  Skin: Normal texture and temperature to palpation. No rash noted.  Neuro: MS is good  with appropriate affect, pt is alert and Ox3   DM foot exam: 06/07/2018 The skin of the feet is intact without sores or ulcerations. The pedal pulses are 2+ on right and 2+ on left. The sensation is intact to a screening 5.07, 10 gram monofilament bilaterally     DATA REVIEWED:  Lab Results  Component Value Date   HGBA1C 16.8 (H) 10/24/2018   HGBA1C  06/04/2018     Comment:     >15   HGBA1C 15.5 (H) 06/19/2017   Lab Results  Component Value Date   MICROALBUR 7.2 03/27/2016   LDLCALC 192 (H) 03/27/2016   CREATININE 0.79 10/27/2018   Lab Results  Component Value Date   MICRALBCREAT 48 (H) 03/27/2016     Lab Results  Component Value Date   CHOL 272 (H) 03/27/2016   HDL 47 (L) 03/27/2016   LDLCALC 192 (H) 03/27/2016   TRIG 163 (H) 03/27/2016   CHOLHDL 5.8 (H) 03/27/2016         ASSESSMENT / PLAN / RECOMMENDATIONS:   1) Type 1 Diabetes Mellitus, Poorly controlled, Without complications - Most recent A1c of 16.8 %. Goal A1c < 7.0 %.    Plan:  - Pt with continued hyperglycemia due to medication conc-compliance, she has been doing better the past few weeks.  - Her BG's in the past week have been acceptable, she does states early morning hypoglycemia - Pt is requiring minimal amount of prandial at this time  and this is due to being on way too much basal insulin, pt is riding on her basal insulin which put her at risk for hypoglycemia when she doesn;t eat as much or skips meals.  - She was advised to take Lantus once a day as its a 24-hr medicine and no need to divide the dose with small doses.  - She was provided with a prescription for Dexcom  - She is interested in the in-pen   MEDICATIONS:  Decrease Lantus to 24 units ONCE daily   Humalog 6 units with each meal ( can use 4 units for small meal and 8 units for a large meal)  EDUCATION / INSTRUCTIONS:  BG monitoring instructions: Patient is instructed to check her blood sugars 4 times a day, before meals and bedtime.  Call [facility] if: BG persistently < 70 or > 300. . I reviewed the Rule of 15 for the treatment of hypoglycemia in detail with the patient. Literature supplied.     F/U in 3 months    Signed electronically by: Lyndle Herrlich, MD  [facility]  [facility] [address] [place], [place] [address] Phone: [phone] FAX: [phone]   CC: Hoy Register, MD [address] [place] [address] Phone: [phone]  Fax: [phone]  Return to Endocrinology clinic as below: Future Appointments  Date Time Provider Department Center  12/10/2018 10:30 AM Hoy Register, MD [id number] None  03/04/2019 10:50 AM Allicia Culley, Konrad Dolores, MD [id number] None

## 2018-11-27 ENCOUNTER — Encounter: Payer: Self-pay | Admitting: Internal Medicine

## 2018-11-27 ENCOUNTER — Other Ambulatory Visit: Payer: Self-pay

## 2018-11-27 ENCOUNTER — Ambulatory Visit: Payer: BLUE CROSS/BLUE SHIELD | Admitting: Internal Medicine

## 2018-11-27 ENCOUNTER — Telehealth: Payer: Self-pay | Admitting: Internal Medicine

## 2018-11-27 DIAGNOSIS — E1065 Type 1 diabetes mellitus with hyperglycemia: Secondary | ICD-10-CM | POA: Diagnosis not present

## 2018-11-27 MED ORDER — INSULIN LISPRO 100 UNIT/ML CARTRIDGE: 6.0000 [IU] | Freq: Three times a day (TID) | SUBCUTANEOUS | 11 refills | Status: DC

## 2018-11-27 MED ORDER — INPEN 100-PINK-LILLY DEVI: 1.0000 | Freq: Once | 0 refills | Status: AC

## 2018-11-27 MED ORDER — DEXCOM G6 RECEIVER DEVI: 1.0000 | 0 refills | Status: DC

## 2018-11-27 MED ORDER — INSULIN GLARGINE 100 UNIT/ML SOLOSTAR PEN: 24.0000 [IU] | PEN_INJECTOR | Freq: Every day | SUBCUTANEOUS | 0 refills | Status: DC

## 2018-11-27 MED ORDER — DEXCOM G6 TRANSMITTER MISC: 1.0000 | 3 refills | Status: DC

## 2018-11-27 MED ORDER — INSULIN LISPRO (1 UNIT DIAL) 100 UNIT/ML (KWIKPEN): 6.0000 [IU] | PEN_INJECTOR | Freq: Three times a day (TID) | SUBCUTANEOUS | 0 refills | Status: DC

## 2018-11-27 MED ORDER — DEXCOM G6 SENSOR MISC: 1.0000 | 6 refills | Status: DC

## 2018-11-27 NOTE — Patient Instructions (Addendum)
-   Decrease Lantus to 24 units ONCE a day  - Humalog 6 units with an average meal                    4 units with a small meal                    8 units with a large meals      Choose healthy, lower carb lower calorie snacks: toss salad, cooked vegetables, cottage cheese, peanut butter, low fat cheese / string cheese, lower sodium deli meat, tuna salad or chicken salad     HOW TO TREAT LOW BLOOD SUGARS (Blood sugar LESS THAN 70 MG/DL)  Please follow the RULE OF 15 for the treatment of hypoglycemia treatment (when your (blood sugars are less than 70 mg/dL)    STEP 1: Take 15 grams of carbohydrates when your blood sugar is low, which includes:   3-4 GLUCOSE TABS  OR  3-4 OZ OF JUICE OR REGULAR SODA OR  ONE TUBE OF GLUCOSE GEL     STEP 2: RECHECK blood sugar in 15 MINUTES STEP 3: If your blood sugar is still low at the 15 minute recheck --> then, go back to STEP 1 and treat AGAIN with another 15 grams of carbohydrates.

## 2018-11-27 NOTE — Telephone Encounter (Signed)
Questions about Humalog RX  Call [facility].  Pharmacy call back number is [phone]

## 2018-11-28 ENCOUNTER — Other Ambulatory Visit: Payer: Self-pay

## 2018-11-28 ENCOUNTER — Emergency Department (HOSPITAL_COMMUNITY): Payer: BLUE CROSS/BLUE SHIELD

## 2018-11-28 ENCOUNTER — Encounter (HOSPITAL_COMMUNITY): Payer: Self-pay | Admitting: Emergency Medicine

## 2018-11-28 ENCOUNTER — Emergency Department (HOSPITAL_COMMUNITY)
Admission: EM | Admit: 2018-11-28 | Discharge: 2018-11-28 | Disposition: A | Discharge status: Home / Self Care | Payer: BLUE CROSS/BLUE SHIELD | Attending: Emergency Medicine | Admitting: Emergency Medicine

## 2018-11-28 DIAGNOSIS — Z794 Long term (current) use of insulin: Secondary | ICD-10-CM | POA: Insufficient documentation

## 2018-11-28 DIAGNOSIS — Z79899 Other long term (current) drug therapy: Secondary | ICD-10-CM | POA: Insufficient documentation

## 2018-11-28 DIAGNOSIS — E109 Type 1 diabetes mellitus without complications: Secondary | ICD-10-CM | POA: Insufficient documentation

## 2018-11-28 DIAGNOSIS — R519 Headache, unspecified: Secondary | ICD-10-CM

## 2018-11-28 DIAGNOSIS — N739 Female pelvic inflammatory disease, unspecified: Secondary | ICD-10-CM | POA: Insufficient documentation

## 2018-11-28 DIAGNOSIS — R51 Headache: Secondary | ICD-10-CM | POA: Diagnosis not present

## 2018-11-28 DIAGNOSIS — N898 Other specified noninflammatory disorders of vagina: Secondary | ICD-10-CM | POA: Diagnosis not present

## 2018-11-28 DIAGNOSIS — R10815 Periumbilic abdominal tenderness: Secondary | ICD-10-CM | POA: Diagnosis not present

## 2018-11-28 DIAGNOSIS — N730 Acute parametritis and pelvic cellulitis: Secondary | ICD-10-CM

## 2018-11-28 DIAGNOSIS — R102 Pelvic and perineal pain: Secondary | ICD-10-CM | POA: Diagnosis present

## 2018-11-28 LAB — CBC WITH DIFFERENTIAL/PLATELET
Abs Immature Granulocytes: 0.03 10*3/uL (ref 0.00–0.07)
Basophils Absolute: 0.1 10*3/uL (ref 0.0–0.1)
Basophils Relative: 1 %
Eosinophils Absolute: 0.4 10*3/uL (ref 0.0–0.5)
Eosinophils Relative: 7 %
HCT: 34.7 % — ABNORMAL LOW (ref 36.0–46.0)
Hemoglobin: 10.7 g/dL — ABNORMAL LOW (ref 12.0–15.0)
Immature Granulocytes: 1 %
Lymphocytes Relative: 37 %
Lymphs Abs: 2.1 10*3/uL (ref 0.7–4.0)
MCH: 26.6 pg (ref 26.0–34.0)
MCHC: 30.8 g/dL (ref 30.0–36.0)
MCV: 86.3 fL (ref 80.0–100.0)
Monocytes Absolute: 0.6 10*3/uL (ref 0.1–1.0)
Monocytes Relative: 10 %
Neutro Abs: 2.6 10*3/uL (ref 1.7–7.7)
Neutrophils Relative %: 44 %
Platelets: 343 10*3/uL (ref 150–400)
RBC: 4.02 MIL/uL (ref 3.87–5.11)
RDW: 13.4 % (ref 11.5–15.5)
WBC: 5.8 10*3/uL (ref 4.0–10.5)
nRBC: 0 % (ref 0.0–0.2)

## 2018-11-28 LAB — URINALYSIS, ROUTINE W REFLEX MICROSCOPIC
Bilirubin Urine: NEGATIVE
Glucose, UA: NEGATIVE mg/dL
Ketones, ur: NEGATIVE mg/dL
Nitrite: NEGATIVE
Protein, ur: 100 mg/dL — AB
Specific Gravity, Urine: 1.017 (ref 1.005–1.030)
pH: 5 (ref 5.0–8.0)

## 2018-11-28 LAB — COMPREHENSIVE METABOLIC PANEL
ALT: 22 U/L (ref 0–44)
AST: 41 U/L (ref 15–41)
Albumin: 3.3 g/dL — ABNORMAL LOW (ref 3.5–5.0)
Alkaline Phosphatase: 51 U/L (ref 38–126)
Anion gap: 5 (ref 5–15)
BUN: 17 mg/dL (ref 6–20)
CO2: 18 mmol/L — ABNORMAL LOW (ref 22–32)
Calcium: 9 mg/dL (ref 8.9–10.3)
Chloride: 112 mmol/L — ABNORMAL HIGH (ref 98–111)
Creatinine, Ser: 0.8 mg/dL (ref 0.44–1.00)
GFR calc Af Amer: >60 mL/min (ref >60)
GFR calc non Af Amer: >60 mL/min (ref >60)
Glucose, Bld: 154 mg/dL — ABNORMAL HIGH (ref 70–99)
Potassium: 4.6 mmol/L (ref 3.5–5.1)
Sodium: 135 mmol/L (ref 135–145)
Total Bilirubin: 0.4 mg/dL (ref 0.3–1.2)
Total Protein: 6.6 g/dL (ref 6.5–8.1)

## 2018-11-28 LAB — CBG MONITORING, ED: Glucose-Capillary: 201 mg/dL — ABNORMAL HIGH (ref 70–99)

## 2018-11-28 LAB — WET PREP, GENITAL
Sperm: NONE SEEN
Trich, Wet Prep: NONE SEEN
Yeast Wet Prep HPF POC: NONE SEEN

## 2018-11-28 LAB — I-STAT BETA HCG BLOOD, ED (MC, WL, AP ONLY): I-stat hCG, quantitative: <5 m[IU]/mL (ref <5)

## 2018-11-28 MED ORDER — DOXYCYCLINE HYCLATE 100 MG PO TABS: 100.0000 mg | ORAL_TABLET | Freq: Once | ORAL | Status: DC

## 2018-11-28 MED ORDER — SODIUM CHLORIDE 0.9 % IV BOLUS
1000.0000 mL | Freq: Once | INTRAVENOUS | Status: AC
  Administered 2018-11-28: 1000 mL via INTRAVENOUS

## 2018-11-28 MED ORDER — METRONIDAZOLE 500 MG PO TABS: 500.0000 mg | ORAL_TABLET | Freq: Two times a day (BID) | ORAL | 0 refills | Status: DC

## 2018-11-28 MED ORDER — NAPROXEN 500 MG PO TABS: 500.0000 mg | ORAL_TABLET | Freq: Two times a day (BID) | ORAL | 0 refills | Status: DC

## 2018-11-28 MED ORDER — CEFTRIAXONE SODIUM 250 MG IJ SOLR
250.0000 mg | Freq: Once | INTRAMUSCULAR | Status: AC
  Administered 2018-11-28: 250 mg via INTRAMUSCULAR
  Filled 2018-11-28: qty 250

## 2018-11-28 MED ORDER — KETOROLAC TROMETHAMINE 15 MG/ML IJ SOLN
15.0000 mg | Freq: Once | INTRAMUSCULAR | Status: AC
  Administered 2018-11-28: 15 mg via INTRAVENOUS
  Filled 2018-11-28: qty 1

## 2018-11-28 MED ORDER — DOXYCYCLINE HYCLATE 100 MG PO CAPS: 100.0000 mg | ORAL_CAPSULE | Freq: Two times a day (BID) | ORAL | 0 refills | Status: DC

## 2018-11-28 MED ORDER — PROCHLORPERAZINE EDISYLATE 10 MG/2ML IJ SOLN
10.0000 mg | Freq: Once | INTRAMUSCULAR | Status: AC
  Administered 2018-11-28: 10 mg via INTRAVENOUS
  Filled 2018-11-28: qty 2

## 2018-11-28 MED ORDER — INSULIN ASPART 100 UNIT/ML CARTRIDGE (PENFILL): 6.0000 [IU] | Freq: Three times a day (TID) | SUBCUTANEOUS | 11 refills | Status: DC

## 2018-11-28 NOTE — ED Notes (Signed)
Patient transported to Ultrasound 

## 2018-11-28 NOTE — Telephone Encounter (Signed)
Spoke to pharmacy and she stated that she was not sure if rx needed a PA but she would call me back.

## 2018-11-28 NOTE — ED Notes (Signed)
Patient verbalizes understanding of discharge instructions. Opportunity for questioning and answers were provided. Armband removed by staff, pt discharged from ED ambulatory to home.  

## 2018-11-28 NOTE — Discharge Instructions (Addendum)
You were seen in the emergency department today for headache and abdominal pain.  Your work-up was overall reassuring.  Your your labs showed that your blood sugar was mildly high.  Your pregnancy test was negative.  Your urine had some blood in it, this rechecked by primary care.  Your wet prep showed bacterial vaginosis which is not sexually transmitted.  I tested you for gonorrhea, chlamydia, HIV, and syphilis, we will call you if results are positive, if positive you will need to inform all sexual partners.  Ultrasound showed ovarian cyst.  Your CT of your head showed some changes of your sinuses but no acute concerning findings of your brain.  We are covering you for pelvic inflammatory disease with antibiotics.  We are sending you home with Flagyl and doxycycline.  Do not drink alcohol when taking Flagyl as it extremely dangerous.  As discussed with your previous reactions to doxycycline be sure to monitor your symptoms very closely.  Should you experience rash, trouble breathing, feeling of throat closing, facial swelling, passing out, nausea, vomiting, or any other concerns please return to the ER immediately for the symptoms.  Please abstain from intercourse until you have completed antibiotics and for 7 days following this.  Also sending her with naproxen to help with pelvic pain and your headache.  - Naproxen is a nonsteroidal anti-inflammatory medication that will help with pain and swelling. Be sure to take this medication as prescribed with food, 1 pill every 12 hours,  It should be taken with food, as it can cause stomach upset, and more seriously, stomach bleeding. Do not take other nonsteroidal anti-inflammatory medications with this such as Advil, Motrin, Aleve, Mobic, Goodie Powder, or Motrin.    You make take Tylenol per over the counter dosing with these medications.   We have prescribed you new medication(s) today. Discuss the medications prescribed today with your pharmacist as  they can have adverse effects and interactions with your other medicines including over the counter and prescribed medications. Seek medical evaluation if you start to experience new or abnormal symptoms after taking one of these medicines, seek care immediately if you start to experience difficulty breathing, feeling of your throat closing, facial swelling, or rash as these could be indications of a more serious allergic reaction     Follow-up with OB/GYN within 3 days.  Return to the ER for new or worsening symptoms including but not limited to worsening pain, fever, passing out, inability to keep fluids down, any of the allergic reaction symptoms discussed above, or any other concerns.

## 2018-11-28 NOTE — ED Provider Notes (Signed)
[facility] DEPART[facility] Provider Note   CSN: [id number] Arrival date & time: 11/28/18  1101     History   Chief Complaint Chief Complaint  Patient presents with   Headache   Abdominal Pain   Back Pain    HPI Ashley Freeman is a 28 y.o. female with a hx of T1DM, prior medication non compliance, PID, & prior abscesses of multiple locations who presents to the ED w/ complaints of headache x 1 month & R sided pelvic & back pain x 1 week.   Patient reports that she has had a fairly constant headache x 1 month, progressively worsening, waxes/wanes in severity, but she does not feel this has ever really resolved. Pain is primarily to the R side of the head but can be generalized. She gets a bit dizzy/lightheaded with this at times, but no syncope has occurred. Current pain s a 6/10, has taken OTC migraine medicine w/ some improvement at times. Denies hx of similar headaches. Denies blurry vision, double vision, numbness, weakness, syncope, fever, neck stiffness, or recent head trauma.  Patient also with complaint of fairly constant R sided pelvic & lower back pain, better if she lays on that side, no alleviating factors. Has taken ibuprofen without much change. Reports associated vaginal discharge that is white with similar onset, initially pruritic but itching has resolved. She has had some urinary urgency, but no dysuria. Denies fever, chills, nausea, vomiting, diarrhea, hematuria, or vaginal bleeding. LMP 10/21/18.  Has had 2 sexual partners in the past 6 months, has used protection some of the time, but not consistently.      HPI  Past Medical History:  Diagnosis Date   Diabetes mellitus 2001   Diagnosed at age 52 ; Type I   Diarrhea 05/30/2016   DKA (diabetic ketoacidoses) ([place]) 08/19/2013   DKA (diabetic ketoacidoses) ([place] [facility]) 05/30/2016   Gonorrhea 08/2011   Treated in 09/2011    Patient Active Problem List   Diagnosis Date Noted   Acute  lower UTI    DKA (diabetic ketoacidosis) ([place]) 12/29/2016   Inguinal abscess    History of trichomoniasis 05/31/2016   Diarrhea 05/31/2016   DKA (diabetic ketoacidoses) ([place]) 05/30/2016   Hyperlipidemia 03/28/2016   Diabetic neuropathy ([place]) 01/16/2016   DKA, type 1 ([place]) 11/24/2015   BV (bacterial vaginosis) 11/24/2015   Chest pain 11/24/2015   AKI (acute kidney injury) ([place]) 07/26/2014   Infection of urinary tract 04/08/2014   PID (acute pelvic inflammatory disease) 04/06/2014   Diabetic ketoacidosis without coma associated with type 1 diabetes mellitus ([place]) 09/19/2013   Infected cyst of Bartholin's gland duct 09/19/2013   Bartholin's gland abscess 09/19/2013   Sepsis ([place]) 09/19/2013   Abscess, gluteal, right 08/24/2013   Health care maintenance 08/24/2013   Hypophosphatemia 08/18/2013   Hypomagnesemia 08/18/2013   Leukocytosis, unspecified 08/17/2013   Fever, unspecified 08/17/2013   H/O medication noncompliance 08/12/2013   Hyponatremia 08/04/2012   Hypokalemia 05/07/2012   Anemia 02/19/2012   Hyperglycemia 11/06/2011   Tachycardia 10/17/2011   Diabetes mellitus type 1, uncontrolled, with complications ([place]) 05/39/7673    Past Surgical History:  Procedure Laterality Date   INCISION AND DRAINAGE PERIRECTAL ABSCESS Right 08/18/2013   Procedure: IRRIGATION AND DEBRIDEMENT GLUTEAL ABSCESS;  Surgeon: Ralene Ok, MD;  Location: [facility];  Service: General;  Laterality: Right;   INCISION AND DRAINAGE PERIRECTAL ABSCESS Right 09/19/2013   Procedure: IRRIGATION AND DEBRIDEMENT RIGHT GLUTEAL AND LABIAL ABSCESSES;  Surgeon: Ralene Ok, MD;  Location:  [facility];  Service: General;  Laterality: Right;   INCISION AND DRAINAGE PERIRECTAL ABSCESS Right 09/24/2013   Procedure: IRRIGATION AND DEBRIDEMENT PERIRECTAL ABSCESS;  Surgeon: Gwenyth Ober, MD;  Location: [facility];  Service: General;  Laterality: Right;     OB History    Gravida  1   Para  0    Term      Preterm      AB  1   Living  0     SAB  1   TAB      Ectopic      Multiple      Live Births               Home Medications    Prior to Admission medications   Medication Sig Start Date End Date Taking? Authorizing Provider  blood glucose meter kit and supplies KIT Dispense based on patient and insurance preference. Use up to four times daily as directed. (FOR ICD-9 250.00, 250.01). 05/25/17   Isla Pence, MD  Continuous Blood Gluc Receiver (DEXCOM G6 RECEIVER) DEVI 1 Device by Does not apply route as directed. 11/27/18   Shamleffer, Melanie Crazier, MD  Continuous Blood Gluc Sensor (DEXCOM G6 SENSOR) MISC 1 Device by Does not apply route as directed. 11/27/18   Shamleffer, Melanie Crazier, MD  Continuous Blood Gluc Transmit (DEXCOM G6 TRANSMITTER) MISC 1 Device by Does not apply route as directed. 11/27/18   Shamleffer, Melanie Crazier, MD  insulin aspart (NOVOLOG) cartridge Inject 6 Units into the skin 3 (three) times daily with meals. 11/28/18   Shamleffer, Melanie Crazier, MD  Insulin Glargine (LANTUS) 100 UNIT/ML Solostar Pen Inject 24 Units into the skin daily. 11/27/18   Shamleffer, Melanie Crazier, MD  insulin lispro (HUMALOG) 100 UNIT/ML cartridge Inject 0.06 mLs (6 Units total) into the skin 3 (three) times daily with meals. 11/27/18   Shamleffer, Melanie Crazier, MD  lisinopril (ZESTRIL) 2.5 MG tablet Take 1 tablet (2.5 mg total) by mouth daily. 10/27/18   Charlott Rakes, MD    Family History Family History  Problem Relation Age of Onset   Asthma Mother    Gout Father    Diabetes Paternal Grandmother    Anesthesia problems Neg Hx     Social History Social History   Tobacco Use   Smoking status: Never Smoker   Smokeless tobacco: Never Used  Substance Use Topics   Alcohol use: Not Currently   Drug use: No     Allergies   Penicillins, Benadryl [diphenhydramine], and Doxycycline   Review of Systems Review of Systems    Constitutional: Negative for chills and fever.  Eyes: Negative for visual disturbance.  Respiratory: Negative for shortness of breath.   Cardiovascular: Negative for chest pain.  Gastrointestinal: Positive for abdominal pain. Negative for blood in stool, constipation, diarrhea, nausea and vomiting.  Genitourinary: Positive for pelvic pain, urgency and vaginal discharge. Negative for dysuria, frequency, genital sores, hematuria and vaginal bleeding.  Musculoskeletal: Positive for back pain.  Neurological: Positive for dizziness, light-headedness and headaches. Negative for tremors, seizures, syncope, facial asymmetry, speech difficulty, weakness and numbness.       Negative for incontinence.   All other systems reviewed and are negative.    Physical Exam Updated Vital Signs BP 117/82 (BP Location: Right Arm)    Pulse (!) 112    Temp 99.3 F (37.4 C) (Oral)    Resp 16    LMP 10/21/2018    SpO2 95%   Physical Exam Vitals  signs and nursing note reviewed. Exam conducted with a chaperone present.  Constitutional:      General: She is not in acute distress.    Appearance: She is well-developed. She is not toxic-appearing.  HENT:     Head: Normocephalic and atraumatic.  Eyes:     General:        Right eye: No discharge.        Left eye: No discharge.     Conjunctiva/sclera: Conjunctivae normal.  Neck:     Musculoskeletal: Neck supple.  Cardiovascular:     Rate and Rhythm: Regular rhythm. Tachycardia present.  Pulmonary:     Effort: Pulmonary effort is normal. No respiratory distress.     Breath sounds: Normal breath sounds. No wheezing, rhonchi or rales.  Abdominal:     General: There is no distension.     Palpations: Abdomen is soft.     Tenderness: There is abdominal tenderness (right suprabuic area). There is no right CVA tenderness, left CVA tenderness, guarding or rebound. Negative signs include Murphy's sign, Rovsing's sign, McBurney's sign, psoas sign and obturator sign.   Genitourinary:    Exam position: Supine.     Labia:        Right: No lesion.        Left: No lesion.      Vagina: Vaginal discharge (white to yellow, slightly blood tinged appearing) present.     Cervix: No cervical motion tenderness or friability.     Adnexa:        Right: Tenderness present. No mass.         Left: No mass or tenderness.       Comments: EDT present as chaperone.  Musculoskeletal:     Comments: No obvious deformity, rashes, or areas of erythema.  Back: no midline tenderness. R lumbar paraspinal muscle tenderness.   Skin:    General: Skin is warm and dry.     Findings: No rash.  Neurological:     Mental Status: She is alert.     Comments: Alert. Clear speech. No facial droop. CNIII-XII grossly intact. Bilateral upper and lower extremities' sensation grossly intact. 5/5 symmetric strength with grip strength and with plantar and dorsi flexion bilaterally.Normal finger to nose bilaterally. Negative pronator drift. Negative Romberg sign. Gait is steady and intact.    Psychiatric:        Behavior: Behavior normal.    ED Treatments / Results  Labs (all labs ordered are listed, but only abnormal results are displayed) Labs Reviewed  WET PREP, GENITAL - Abnormal; Notable for the following components:      Result Value   Clue Cells Wet Prep HPF POC PRESENT (*)    WBC, Wet Prep HPF POC MANY (*)    All other components within normal limits  URINALYSIS, ROUTINE W REFLEX MICROSCOPIC - Abnormal; Notable for the following components:   Hgb urine dipstick MODERATE (*)    Protein, ur 100 (*)    Leukocytes,Ua SMALL (*)    Bacteria, UA RARE (*)    All other components within normal limits  COMPREHENSIVE METABOLIC PANEL - Abnormal; Notable for the following components:   Chloride 112 (*)    CO2 18 (*)    Glucose, Bld 154 (*)    Albumin 3.3 (*)    All other components within normal limits  CBC WITH DIFFERENTIAL/PLATELET - Abnormal; Notable for the following components:    Hemoglobin 10.7 (*)    HCT 34.7 (*)    All other  components within normal limits  CBG MONITORING, ED - Abnormal; Notable for the following components:   Glucose-Capillary 201 (*)    All other components within normal limits  RPR  HIV ANTIBODY (ROUTINE TESTING W REFLEX)  I-STAT BETA HCG BLOOD, ED ([facility], [facility], [facility] ONLY)  GC/CHLAMYDIA PROBE AMP ([place]) NOT AT [facility]    EKG None  Radiology Ct Head Wo Contrast  Result Date: 11/28/2018 CLINICAL DATA:  Headache for 1 month EXAM: CT HEAD WITHOUT CONTRAST TECHNIQUE: Contiguous axial images were obtained from the base of the skull through the vertex without intravenous contrast. COMPARISON:  November 20, 2015 FINDINGS: Brain: The ventricles are normal in size and configuration. There is no intracranial mass, hemorrhage, extra-axial fluid collection, or midline shift. The brain parenchyma appears unremarkable. No evident acute infarct. Vascular: There is no hyperdense vessel. There is no appreciable vascular calcification. Skull: The bony calvarium appears intact. Sinuses/Orbits: There is slight mucosal thickening in several ethmoid air cells. Other visualized paranasal sinuses are clear. Orbits appear symmetric bilaterally. Other: Mastoid air cells are clear. IMPRESSION: Slight mucosal thickening in several ethmoid air cells. Study otherwise unremarkable. Electronically Signed   By: Lowella Grip III M.D.   On: 11/28/2018 14:20   US Transvaginal Non-ob  Result Date: 11/28/2018 CLINICAL DATA:  Right pelvic pain assess for torsion EXAM: TRANSABDOMINAL AND TRANSVAGINAL ULTRASOUND OF PELVIS DOPPLER ULTRASOUND OF OVARIES TECHNIQUE: Both transabdominal and transvaginal ultrasound examinations of the pelvis were performed. Transabdominal technique was performed for global imaging of the pelvis including uterus, ovaries, adnexal regions, and pelvic cul-de-sac. It was necessary to proceed with endovaginal exam following the transabdominal exam to visualize the  endometrium and bilateral ovaries. Color and duplex Doppler ultrasound was utilized to evaluate blood flow to the ovaries. COMPARISON:  None. FINDINGS: Uterus Measurements: 7.9.4.4 x 6.5 cm = volume: 117 mL. No fibroids or other mass visualized. Endometrium Thickness: 8.4 mm.  No focal abnormality visualized. Right ovary Measurements: 4.3 x 2.1 x 2.2 cm = volume: 9.9 mL. There is a 2 x 1.2 x 1.6 cm complex cyst in the right ovary. Left ovary Measurements: 4.1 x 2.2 x 3.9 cm = volume: 18.8 mL. There is a 2.3 x 1.3 x 2 cm complex cyst in the left ovary. Pulsed Doppler evaluation of both ovaries demonstrates normal low-resistance arterial and venous waveforms. Other findings Small amount of free fluid is identified. IMPRESSION: No evidence of ovarian torsion bilaterally. Small complex cysts in bilateral ovaries. Nonspecific. They may represent hemorrhagic cysts. Electronically Signed   By: Abelardo Diesel M.D.   On: 11/28/2018 15:05   US Pelvis Complete  Result Date: 11/28/2018 CLINICAL DATA:  Right pelvic pain assess for torsion EXAM: TRANSABDOMINAL AND TRANSVAGINAL ULTRASOUND OF PELVIS DOPPLER ULTRASOUND OF OVARIES TECHNIQUE: Both transabdominal and transvaginal ultrasound examinations of the pelvis were performed. Transabdominal technique was performed for global imaging of the pelvis including uterus, ovaries, adnexal regions, and pelvic cul-de-sac. It was necessary to proceed with endovaginal exam following the transabdominal exam to visualize the endometrium and bilateral ovaries. Color and duplex Doppler ultrasound was utilized to evaluate blood flow to the ovaries. COMPARISON:  None. FINDINGS: Uterus Measurements: 7.9.4.4 x 6.5 cm = volume: 117 mL. No fibroids or other mass visualized. Endometrium Thickness: 8.4 mm.  No focal abnormality visualized. Right ovary Measurements: 4.3 x 2.1 x 2.2 cm = volume: 9.9 mL. There is a 2 x 1.2 x 1.6 cm complex cyst in the right ovary. Left ovary Measurements: 4.1 x 2.2 x  3.9 cm = volume: 18.8 mL. There is a 2.3 x 1.3 x 2 cm complex cyst in the left ovary. Pulsed Doppler evaluation of both ovaries demonstrates normal low-resistance arterial and venous waveforms. Other findings Small amount of free fluid is identified. IMPRESSION: No evidence of ovarian torsion bilaterally. Small complex cysts in bilateral ovaries. Nonspecific. They may represent hemorrhagic cysts. Electronically Signed   By: Abelardo Diesel M.D.   On: 11/28/2018 15:05   [place] Art/ven Flow Abd Pelv Doppler  Result Date: 11/28/2018 CLINICAL DATA:  Right pelvic pain assess for torsion EXAM: TRANSABDOMINAL AND TRANSVAGINAL ULTRASOUND OF PELVIS DOPPLER ULTRASOUND OF OVARIES TECHNIQUE: Both transabdominal and transvaginal ultrasound examinations of the pelvis were performed. Transabdominal technique was performed for global imaging of the pelvis including uterus, ovaries, adnexal regions, and pelvic cul-de-sac. It was necessary to proceed with endovaginal exam following the transabdominal exam to visualize the endometrium and bilateral ovaries. Color and duplex Doppler ultrasound was utilized to evaluate blood flow to the ovaries. COMPARISON:  None. FINDINGS: Uterus Measurements: 7.9.4.4 x 6.5 cm = volume: 117 mL. No fibroids or other mass visualized. Endometrium Thickness: 8.4 mm.  No focal abnormality visualized. Right ovary Measurements: 4.3 x 2.1 x 2.2 cm = volume: 9.9 mL. There is a 2 x 1.2 x 1.6 cm complex cyst in the right ovary. Left ovary Measurements: 4.1 x 2.2 x 3.9 cm = volume: 18.8 mL. There is a 2.3 x 1.3 x 2 cm complex cyst in the left ovary. Pulsed Doppler evaluation of both ovaries demonstrates normal low-resistance arterial and venous waveforms. Other findings Small amount of free fluid is identified. IMPRESSION: No evidence of ovarian torsion bilaterally. Small complex cysts in bilateral ovaries. Nonspecific. They may represent hemorrhagic cysts. Electronically Signed   By: Abelardo Diesel M.D.   On:  11/28/2018 15:05    Procedures Procedures (including critical care time)  Medications Ordered in ED Medications  sodium chloride 0.9 % bolus 1,000 mL (1,000 mLs Intravenous New Bag/Given 11/28/18 1307)  prochlorperazine (COMPAZINE) injection 10 mg (10 mg Intravenous Given 11/28/18 1308)  ketorolac (TORADOL) 15 MG/ML injection 15 mg (15 mg Intravenous Given 11/28/18 1530)  cefTRIAXone (ROCEPHIN) injection 250 mg (250 mg Intramuscular Given 11/28/18 1529)     Initial Impression / Assessment and Plan / ED Course  I have reviewed the triage vital signs and the nursing notes.  Pertinent labs & imaging results that were available during my care of the patient were reviewed by me and considered in my medical decision making (see chart for details).   Patient presents to the ED w/ complaints of R sided pelvic/back pain w/ vaginal discharge x 1 week & headache x 1 month. Patient is nontoxic appearing, resting comfortably, vitals signs notable for tachycardia.   Labs per triage reviewed thus far:  CBG: 201 CBC: No leukocytosis. Anemia similar to prior.  CMP: Mild hyperglycemia @ 154, bicarb mildly low @ 18- no anion gap elevation. Renal function @ baseline.  Preg test: Negative.   Pelvic/back pain: Exam w/ right sided lumbar paraspinal muscle tenderness as well as R suprapubic tenderness & R adnexal tenderness. No peritoneal signs. Suspicion for PID, considering TOA, also considering ovarian cyst or torsion. Appendicitis seems unlikely w/o mcburneys point tenderness, additionally negative psoas, obturator & rovsings. Pelvic exam w/ what appeared to be blood tinged discharge, suspect this as source of hematuria on UA- not classic for UTI, no dysuria. Wet prep with BV, no trich/yeast. Will proceed w/ ultrasound for further assessment.  [place]:  No evidence of ovarian torsion bilaterally. Small complex cysts in bilateral ovaries. Nonspecific. They may represent hemorrhagic cysts. No TOA.   --> Will cover  for PID, she is not toxic or septic appearing, afebrile, no leukocytosis do not feel she requires admission. Will tx w/ ceftriaxone in the ED & prescriptions for flagyl & doxycycline. OBGYN follow up. Patient has reported allergy to doxycycline in the electronic medical records- we discussed this, she states she was itchy, no rash, facial swelling, trouble breathing, or throat swelling sensation. Discussed w/ pharmacy who recommends trial of this medicine which I am in agreement with, we initially planned to give first done in the ER but patient refused to stay, she states she feels comfortable taking this at home and would like to go home now. We discussed very strict return precautions regarding allergic reaction & return precautions in general at length.   Headache: Gradual onset with steady progression in severity, CT head obtained given no hx of similar headaches slight mucosal thickening in several ethmoid air cells. Study otherwise unremarkable, normal neuro exam, no fever, no nuchal rigidity, no visual disturbance- non concerning for [facility], ICH, ischemic CVA, dural venous sinus thrombosis, acute glaucoma, giant cell arteritis, mass, or meningitis.   Patient symptomatically improved in the ED. She is requesting discharge home. Will DC with abx & naproxen w/ PCP & OBGYN follow up. I discussed results, treatment plan, need for follow-up, and return precautions with the patient. Provided opportunity for questions, patient confirmed understanding and is in agreement with plan.   Findings and plan of care discussed with supervising physician Dr. Venora Maples who is in agreement.    Final Clinical Impressions(s) / ED Diagnoses   Final diagnoses:  PID (acute pelvic inflammatory disease)  Nonintractable headache, unspecified chronicity pattern, unspecified headache type    ED Discharge Orders         Ordered    doxycycline (VIBRAMYCIN) 100 MG capsule  2 times daily     11/28/18 1624    metroNIDAZOLE  (FLAGYL) 500 MG tablet  2 times daily     11/28/18 1624    naproxen (NAPROSYN) 500 MG tablet  2 times daily     11/28/18 [address], Glynda Jaeger, PA-C 11/28/18 1629    Jola Schmidt, MD 11/29/18 2240

## 2018-11-28 NOTE — Telephone Encounter (Signed)
Humalog is not covered by insurance so switched to [employer]

## 2018-11-28 NOTE — ED Triage Notes (Addendum)
Pt reports intermittent headaches since June with lower abdominal pain, lower back pain to all over and some dysuria. Recent admission for DKA.

## 2018-11-29 LAB — RPR: RPR Ser Ql: NONREACTIVE

## 2018-11-29 LAB — GC/CHLAMYDIA PROBE AMP (~~LOC~~) NOT AT ARMC
Chlamydia: POSITIVE — AB
Neisseria Gonorrhea: NEGATIVE

## 2018-11-29 LAB — HIV ANTIBODY (ROUTINE TESTING W REFLEX): HIV Screen 4th Generation wRfx: NONREACTIVE

## 2018-12-10 ENCOUNTER — Other Ambulatory Visit: Payer: Self-pay

## 2018-12-10 ENCOUNTER — Ambulatory Visit (HOSPITAL_BASED_OUTPATIENT_CLINIC_OR_DEPARTMENT_OTHER): Payer: BLUE CROSS/BLUE SHIELD | Admitting: Family Medicine

## 2018-12-10 ENCOUNTER — Encounter: Payer: Self-pay | Admitting: Family Medicine

## 2018-12-10 ENCOUNTER — Other Ambulatory Visit (HOSPITAL_COMMUNITY)
Admission: RE | Admit: 2018-12-10 | Discharge: 2018-12-10 | Disposition: A | Discharge status: Home / Self Care | Payer: BLUE CROSS/BLUE SHIELD | Source: Ambulatory Visit | Attending: Family Medicine | Admitting: Family Medicine

## 2018-12-10 VITALS — BP 106/69 | HR 119 | Temp 98.0°F | Ht 63.0 in | Wt 135.2 lb

## 2018-12-10 DIAGNOSIS — Z124 Encounter for screening for malignant neoplasm of cervix: Secondary | ICD-10-CM | POA: Diagnosis not present

## 2018-12-10 DIAGNOSIS — B373 Candidiasis of vulva and vagina: Secondary | ICD-10-CM

## 2018-12-10 DIAGNOSIS — Z23 Encounter for immunization: Secondary | ICD-10-CM | POA: Diagnosis not present

## 2018-12-10 DIAGNOSIS — B3731 Acute candidiasis of vulva and vagina: Secondary | ICD-10-CM

## 2018-12-10 MED ORDER — FLUCONAZOLE 150 MG PO TABS: 150.0000 mg | ORAL_TABLET | Freq: Once | ORAL | 0 refills | Status: AC

## 2018-12-10 NOTE — Progress Notes (Signed)
Subjective:  Patient ID: Ashley Freeman, female    DOB: 1991-03-03  Age: 28 y.o. MRN: [id number]  CC: Gynecologic Exam   HPI Ashley Freeman is a 28 year old female with a history of type 1 diabetes mellitus (A1c 16.8), diabetic neuropathy here for a Pap smear. She has no concerns today. Her diabetes is currently managed by endocrine.  Past Medical History:  Diagnosis Date  . Diabetes mellitus 2001   Diagnosed at age 60 ; Type I  . Diarrhea 05/30/2016  . DKA (diabetic ketoacidoses) ([place]) 08/19/2013  . DKA (diabetic ketoacidoses) ([place]) 05/30/2016  . Gonorrhea 08/2011   Treated in 09/2011    Past Surgical History:  Procedure Laterality Date  . INCISION AND DRAINAGE PERIRECTAL ABSCESS Right 08/18/2013   Procedure: IRRIGATION AND DEBRIDEMENT GLUTEAL ABSCESS;  Surgeon: Ralene Ok, MD;  Location: [facility];  Service: General;  Laterality: Right;  . INCISION AND DRAINAGE PERIRECTAL ABSCESS Right 09/19/2013   Procedure: IRRIGATION AND DEBRIDEMENT RIGHT GLUTEAL AND LABIAL ABSCESSES;  Surgeon: Ralene Ok, MD;  Location: [place] [facility];  Service: General;  Laterality: Right;  . INCISION AND DRAINAGE PERIRECTAL ABSCESS Right 09/24/2013   Procedure: IRRIGATION AND DEBRIDEMENT PERIRECTAL ABSCESS;  Surgeon: Gwenyth Ober, MD;  Location: [facility];  Service: General;  Laterality: Right;    Family History  Problem Relation Age of Onset  . Asthma Mother   . Gout Father   . Diabetes Paternal Grandmother   . Anesthesia problems Neg Hx     Allergies  Allergen Reactions  . Penicillins Hives and Rash    Has patient had a PCN reaction causing immediate rash, facial/tongue/throat swelling, SOB or lightheadedness with hypotension: Yes Has patient had a PCN reaction causing severe rash involving mucus membranes or skin necrosis: No Has patient had a PCN reaction that required hospitalization: Yes Has patient had a PCN reaction occurring within the last 10 years: No If all of the above answers are  "NO", then may proceed with Cephalosporin use.  . Benadryl [Diphenhydramine] Itching  . Doxycycline Itching    Outpatient Medications Prior to Visit  Medication Sig Dispense Refill  . blood glucose meter kit and supplies KIT Dispense based on patient and insurance preference. Use up to four times daily as directed. (FOR ICD-9 250.00, 250.01). 1 each 0  . Continuous Blood Gluc Receiver (DEXCOM G6 RECEIVER) DEVI 1 Device by Does not apply route as directed. 1 Device 0  . Continuous Blood Gluc Sensor (DEXCOM G6 SENSOR) MISC 1 Device by Does not apply route as directed. 3 each 6  . Continuous Blood Gluc Transmit (DEXCOM G6 TRANSMITTER) MISC 1 Device by Does not apply route as directed. 1 each 3  . doxycycline (VIBRAMYCIN) 100 MG capsule Take 1 capsule (100 mg total) by mouth 2 (two) times daily. 28 capsule 0  . ibuprofen (ADVIL) 200 MG tablet Take 400 mg by mouth every 6 (six) hours as needed for moderate pain or cramping.    . Insulin Glargine (LANTUS) 100 UNIT/ML Solostar Pen Inject 24 Units into the skin daily. (Patient taking differently: Inject 20 Units into the skin daily. ) 15 mL 0  . insulin lispro (HUMALOG) 100 UNIT/ML cartridge Inject 0.06 mLs (6 Units total) into the skin 3 (three) times daily with meals. (Patient taking differently: Inject 6 Units into the skin 3 (three) times daily with meals. As needed) 18 mL 11  . lisinopril (ZESTRIL) 2.5 MG tablet Take 1 tablet (2.5 mg total) by mouth daily. [place]  tablet 3  . naproxen (NAPROSYN) 500 MG tablet Take 1 tablet (500 mg total) by mouth 2 (two) times daily. 10 tablet 0  . insulin aspart (NOVOLOG) cartridge Inject 6 Units into the skin 3 (three) times daily with meals. (Patient not taking: Reported on 11/28/2018) 15 mL 11  . metroNIDAZOLE (FLAGYL) 500 MG tablet Take 1 tablet (500 mg total) by mouth 2 (two) times daily. (Patient not taking: Reported on 12/10/2018) 28 tablet 0   Facility-Administered Medications Prior to Visit  Medication Dose  Route Frequency Provider Last Rate Last Dose  . Insulin Pen Needle (NOVOFINE) 200 each  20 packet Subcutaneous PRN Allie Bossier, MD         ROS Review of Systems  Constitutional: Negative for activity change, appetite change and fatigue.  HENT: Negative for congestion, sinus pressure and sore throat.   Eyes: Negative for visual disturbance.  Respiratory: Negative for cough, chest tightness, shortness of breath and wheezing.   Cardiovascular: Negative for chest pain and palpitations.  Gastrointestinal: Negative for abdominal distention, abdominal pain and constipation.  Endocrine: Negative for polydipsia.  Genitourinary: Negative for dysuria and frequency.  Musculoskeletal: Negative for arthralgias and back pain.  Skin: Negative for rash.  Neurological: Negative for tremors, light-headedness and numbness.  Hematological: Does not bruise/bleed easily.  Psychiatric/Behavioral: Negative for agitation and behavioral problems.    Objective:  BP 106/69   Pulse (!) 119   Temp 98 F (36.7 C) (Oral)   Ht '5\' 3"'  (1.6 m)   Wt 135 lb 3.2 oz (61.3 kg)   LMP 12/01/2018   SpO2 100%   BMI 23.95 kg/m   BP/Weight 12/10/2018 11/28/2018 08/13/8307  Systolic BP 407 680 881  Diastolic BP 69 97 82  Wt. (Lbs) 135.2 - 133.6  BMI 23.95 - 23.67      Physical Exam Constitutional:      Appearance: She is well-developed.  Cardiovascular:     Rate and Rhythm: Normal rate.     Heart sounds: Normal heart sounds. No murmur.  Pulmonary:     Effort: Pulmonary effort is normal.     Breath sounds: Normal breath sounds. No wheezing or rales.  Chest:     Chest wall: No tenderness.  Abdominal:     General: Bowel sounds are normal. There is no distension.     Palpations: Abdomen is soft. There is no mass.     Tenderness: There is no abdominal tenderness.  Musculoskeletal: Normal range of motion.  Neurological:     Mental Status: She is alert and oriented to person, place, and time.     CMP Latest  Ref Rng & Units 11/28/2018 10/27/2018 10/24/2018  Glucose 70 - 99 mg/dL 154(H) 116(H) 96  BUN 6 - 20 mg/dL '17 18 16  ' Creatinine 0.44 - 1.00 mg/dL 0.80 0.79 0.79  Sodium 135 - 145 mmol/L 135 137 136  Potassium 3.5 - 5.1 mmol/L 4.6 4.4 3.3(L)  Chloride 98 - 111 mmol/L 112(H) 105 107  CO2 22 - 32 mmol/L 18(L) 19(L) 21(L)  Calcium 8.9 - 10.3 mg/dL 9.0 9.1 8.6(L)  Total Protein 6.5 - 8.1 g/dL 6.6 - -  Total Bilirubin 0.3 - 1.2 mg/dL 0.4 - -  Alkaline Phos 38 - 126 U/L 51 - -  AST 15 - 41 U/L 41 - -  ALT 0 - 44 U/L 22 - -    Lipid Panel     Component Value Date/Time   CHOL 272 (H) 03/27/2016 0917   TRIG 163 (  H) 03/27/2016 0917   HDL 47 (L) 03/27/2016 0917   CHOLHDL 5.8 (H) 03/27/2016 0917   VLDL 33 (H) 03/27/2016 0917   LDLCALC 192 (H) 03/27/2016 0917    CBC    Component Value Date/Time   WBC 5.8 11/28/2018 1118   RBC 4.02 11/28/2018 1118   HGB 10.7 (L) 11/28/2018 1118   HGB 12.8 11/11/2013 0431   HCT 34.7 (L) 11/28/2018 1118   HCT 40.2 11/11/2013 0431   PLT 343 11/28/2018 1118   PLT 272 11/11/2013 0431   MCV 86.3 11/28/2018 1118   MCV 80 11/11/2013 0431   MCH 26.6 11/28/2018 1118   MCHC 30.8 11/28/2018 1118   RDW 13.4 11/28/2018 1118   RDW 14.8 (H) 11/11/2013 0431   LYMPHSABS 2.1 11/28/2018 1118   LYMPHSABS 3.7 (H) 11/11/2013 0431   MONOABS 0.6 11/28/2018 1118   MONOABS 0.5 11/11/2013 0431   EOSABS 0.4 11/28/2018 1118   EOSABS 0.1 11/11/2013 0431   BASOSABS 0.1 11/28/2018 1118   BASOSABS 0.1 11/11/2013 0431    Lab Results  Component Value Date   HGBA1C 16.8 (H) 10/24/2018    Assessment & Plan:   1. Screening for cervical cancer - Cytology - PAP([facility]) - Cervicovaginal ancillary only - HSV Type I/II IgG, IgMw/ reflex  2. Vaginal candidiasis - fluconazole (DIFLUCAN) 150 MG tablet; Take 1 tablet (150 mg total) by mouth once for 1 dose.  Dispense: 1 tablet; Refill: 0  3. Need for Tdap vaccination - Tdap vaccine greater than or equal to 7yo IM     Meds ordered this encounter  Medications  . fluconazole (DIFLUCAN) 150 MG tablet    Sig: Take 1 tablet (150 mg total) by mouth once for 1 dose.    Dispense:  1 tablet    Refill:  0    Follow-up: Return in about 1 year (around 12/10/2019) for complete physical.       Charlott Rakes, MD, FAAFP. [facility] [place], [place]   12/10/2018, 11:08 AM

## 2018-12-11 LAB — CYTOLOGY - PAP: Diagnosis: NEGATIVE

## 2018-12-12 LAB — HSV TYPE I/II IGG, IGMW/ REFLEX
HSV 1 Glycoprotein G Ab, IgG: 30 index — ABNORMAL HIGH (ref 0.00–0.90)
HSV 1 IgM: <1:10 {titer}
HSV 2 IgG, Type Spec: <0.91 index (ref 0.00–0.90)
HSV 2 IgM: <1:10 {titer}

## 2018-12-12 LAB — CERVICOVAGINAL ANCILLARY ONLY
Bacterial vaginitis: POSITIVE — AB
Chlamydia: NEGATIVE
Neisseria Gonorrhea: NEGATIVE
Trichomonas: NEGATIVE

## 2018-12-12 LAB — HM DIABETES EYE EXAM

## 2018-12-15 ENCOUNTER — Other Ambulatory Visit: Payer: Self-pay | Admitting: Family Medicine

## 2018-12-15 MED ORDER — METRONIDAZOLE 0.75 % VA GEL: 1.0000 | Freq: Every day | VAGINAL | 0 refills | Status: DC

## 2018-12-18 ENCOUNTER — Telehealth: Payer: Self-pay | Admitting: Family Medicine

## 2018-12-18 NOTE — Telephone Encounter (Signed)
Pt would like to know if metroNIDAZOLE (METROGEL VAGINAL) 0.75 % vaginal gel [id number] can be switched out for a pill instead..please follow up

## 2018-12-19 NOTE — Telephone Encounter (Signed)
Will route to PCP for review. 

## 2018-12-22 MED ORDER — METRONIDAZOLE 500 MG PO TABS: 500.0000 mg | ORAL_TABLET | Freq: Two times a day (BID) | ORAL | 0 refills | Status: DC

## 2018-12-22 NOTE — Telephone Encounter (Signed)
Done

## 2018-12-23 NOTE — Telephone Encounter (Signed)
Patient was called and informed of medication being sent to pharmacy. 

## 2019-02-06 ENCOUNTER — Ambulatory Visit: Payer: Self-pay

## 2019-03-02 ENCOUNTER — Other Ambulatory Visit: Payer: Self-pay

## 2019-03-03 NOTE — Progress Notes (Deleted)
Name: Ashley Freeman  Age/ Sex: 28 y.o., female   MRN/ DOB: [id number], April 03, 1991     PCP: Charlott Rakes, MD   Reason for Endocrinology Evaluation: Type 2 Diabetes Mellitus  Initial Endocrine Consultative Visit: 06/27/18    PATIENT IDENTIFIER: Ashley Freeman is a 28 y.o. female with a past medical history of T1DM with multiple DKA's. The patient has followed with Endocrinology clinic since 06/27/2018 for consultative assistance with management of her diabetes.  DIABETIC HISTORY:  Ashley Freeman was diagnosed with T1DM at age 44. Hx of multiple DKA's due to non-compliance. Her hemoglobin A1c has ranged from  in 11.3% in 2011, peaking at 19.3% in 2015.   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 We gave her a standing dose of humalog and changed lantus  Dose.   Today (03/03/2019): Ashley Freeman is here for a follow up on diabetes management.She checks her blood sugars 3-4 times daily, preprandial. The patient has had occasional hypoglycemic episodes since the last clinic visit, which typically occur in the morning. The patient is symptomatic with these episodes.   ROS: As per HPI and as detailed below: Review of Systems  Constitutional: Negative for chills and fever.  HENT: Negative for congestion and sore throat.   Gastrointestinal: Negative for diarrhea and nausea.  Neurological: Positive for dizziness and headaches. Negative for tingling and tremors.      HOME DIABETES REGIMEN:   Lantus 24 units daily  Humalog at 6 units 3 times daily with meal     GLUCOSE LOG:  Date  Breakfast  Lunch  Supper Bedtime  11/25/18 157 125 230 151  7/27 137 133 220 109  7/26 85 124 121   7/25 127 175 106 163  7/24 183 184 163   7/23 220 156 106 175  7/22 193 180 73 111  7/21 220 89 208 198  7/20 326 72 119 176     HISTORY:  Past Medical History:  Past Medical History:  Diagnosis Date  . Diabetes  mellitus 2001   Diagnosed at age 46 ; Type I  . Diarrhea 05/30/2016  . DKA (diabetic ketoacidoses) ([place]) 08/19/2013  . DKA (diabetic ketoacidoses) ([place]) 05/30/2016  . Gonorrhea 08/2011   Treated in 09/2011   Past Surgical History:  Past Surgical History:  Procedure Laterality Date  . INCISION AND DRAINAGE PERIRECTAL ABSCESS Right 08/18/2013   Procedure: IRRIGATION AND DEBRIDEMENT GLUTEAL ABSCESS;  Surgeon: Ralene Ok, MD;  Location: [facility];  Service: General;  Laterality: Right;  . INCISION AND DRAINAGE PERIRECTAL ABSCESS Right 09/19/2013   Procedure: IRRIGATION AND DEBRIDEMENT RIGHT GLUTEAL AND LABIAL ABSCESSES;  Surgeon: Ralene Ok, MD;  Location: [facility];  Service: General;  Laterality: Right;  . INCISION AND DRAINAGE PERIRECTAL ABSCESS Right 09/24/2013   Procedure: IRRIGATION AND DEBRIDEMENT PERIRECTAL ABSCESS;  Surgeon: Gwenyth Ober, MD;  Location: [facility];  Service: General;  Laterality: Right;    Social History:  reports that she has never smoked. She has never used smokeless tobacco. She reports previous alcohol use. She reports that she does not use drugs. Family History:  Family History  Problem Relation Age of Onset  . Asthma Mother   . Gout Father   . Diabetes Paternal Grandmother   . Anesthesia problems Neg Hx      HOME MEDICATIONS: Allergies as of 03/04/2019      Reactions   Penicillins Hives, Rash  Has patient had a PCN reaction causing immediate rash, facial/tongue/throat swelling, SOB or lightheadedness with hypotension: Yes Has patient had a PCN reaction causing severe rash involving mucus membranes or skin necrosis: No Has patient had a PCN reaction that required hospitalization: Yes Has patient had a PCN reaction occurring within the last 10 years: No If all of the above answers are "NO", then may proceed with Cephalosporin use.   Benadryl [diphenhydramine] Itching   Doxycycline Itching      Medication List       Accurate as of March 03, 2019  4:25  PM. If you have any questions, ask your nurse or doctor.        blood glucose meter kit and supplies Kit Dispense based on patient and insurance preference. Use up to four times daily as directed. (FOR ICD-9 250.00, 250.01).   Dexcom G6 Receiver Devi 1 Device by Does not apply route as directed.   Dexcom G6 Sensor Misc 1 Device by Does not apply route as directed.   Dexcom G6 Transmitter Misc 1 Device by Does not apply route as directed.   doxycycline 100 MG capsule Commonly known as: VIBRAMYCIN Take 1 capsule (100 mg total) by mouth 2 (two) times daily.   ibuprofen 200 MG tablet Commonly known as: ADVIL Take 400 mg by mouth every 6 (six) hours as needed for moderate pain or cramping.   insulin aspart cartridge Commonly known as: NOVOLOG Inject 6 Units into the skin 3 (three) times daily with meals.   Insulin Glargine 100 UNIT/ML Solostar Pen Commonly known as: LANTUS Inject 24 Units into the skin daily. What changed: how much to take   insulin lispro 100 UNIT/ML cartridge Commonly known as: HUMALOG Inject 0.06 mLs (6 Units total) into the skin 3 (three) times daily with meals. What changed: additional instructions   lisinopril 2.5 MG tablet Commonly known as: ZESTRIL Take 1 tablet (2.5 mg total) by mouth daily.   metroNIDAZOLE 0.75 % vaginal gel Commonly known as: METROGEL VAGINAL Place 1 Applicatorful vaginally at bedtime.   metroNIDAZOLE 500 MG tablet Commonly known as: FLAGYL Take 1 tablet (500 mg total) by mouth 2 (two) times daily.   metroNIDAZOLE 500 MG tablet Commonly known as: FLAGYL Take 1 tablet (500 mg total) by mouth 2 (two) times daily.   naproxen 500 MG tablet Commonly known as: NAPROSYN Take 1 tablet (500 mg total) by mouth 2 (two) times daily.        OBJECTIVE:   Vital Signs: There were no vitals taken for this visit.  Wt Readings from Last 3 Encounters:  12/10/18 135 lb 3.2 oz (61.3 kg)  11/27/18 133 lb 9.6 oz (60.6 kg)  10/27/18  134 lb (60.8 kg)     Exam: General: Pt appears well and is in NAD  Neck: General: Supple without adenopathy. Thyroid: Thyroid size normal.  No goiter or nodules appreciated. No thyroid bruit.  Lungs: Clear with good BS bilat with no rales, rhonchi, or wheezes  Heart: RRR with normal S1 and S2 and no gallops; no murmurs; no rub  Abdomen: Normoactive bowel sounds, soft, nontender, without masses or organomegaly palpable  Extremities: No pretibial edema. No tremor.  Skin: Normal texture and temperature to palpation. No rash noted.  Neuro: MS is good with appropriate affect, pt is alert and Ox3   DM foot exam: 06/07/2018 The skin of the feet is intact without sores or ulcerations. The pedal pulses are 2+ on right and 2+ on left. The sensation  is intact to a screening 5.07, 10 gram monofilament bilaterally     DATA REVIEWED:  Lab Results  Component Value Date   HGBA1C 16.8 (H) 10/24/2018   HGBA1C  06/04/2018     Comment:     >15   HGBA1C 15.5 (H) 06/19/2017   Lab Results  Component Value Date   MICROALBUR 7.2 03/27/2016   LDLCALC 192 (H) 03/27/2016   CREATININE 0.80 11/28/2018   Lab Results  Component Value Date   MICRALBCREAT 48 (H) 03/27/2016     Lab Results  Component Value Date   CHOL 272 (H) 03/27/2016   HDL 47 (L) 03/27/2016   LDLCALC 192 (H) 03/27/2016   TRIG 163 (H) 03/27/2016   CHOLHDL 5.8 (H) 03/27/2016         ASSESSMENT / PLAN / RECOMMENDATIONS:   1) Type 1 Diabetes Mellitus, Poorly controlled, Without complications - Most recent A1c of 16.8 %. Goal A1c < 7.0 %.    Plan:  - Pt with continued hyperglycemia due to medication conc-compliance, she has been doing better the past few weeks.  - Her BG's in the past week have been acceptable, she does states early morning hypoglycemia - Pt is requiring minimal amount of prandial at this time and this is due to being on way too much basal insulin, pt is riding on her basal insulin which put her at risk for  hypoglycemia when she doesn;t eat as much or skips meals.  - She was advised to take Lantus once a day as its a 24-hr medicine and no need to divide the dose with small doses.  - She was provided with a prescription for Dexcom  - She is interested in the in-pen   MEDICATIONS:  Decrease Lantus to 24 units ONCE daily   Humalog 6 units with each meal ( can use 4 units for small meal and 8 units for a large meal)  EDUCATION / INSTRUCTIONS:  BG monitoring instructions: Patient is instructed to check her blood sugars 4 times a day, before meals and bedtime.  Call [facility] if: BG persistently < 70 or > 300. . I reviewed the Rule of 15 for the treatment of hypoglycemia in detail with the patient. Literature supplied.     F/U in 3 months    Signed electronically by: Mack Guise, MD  [facility] End[facility]  [facility] [address] [place], [place] [address] Phone: [phone] FAX: [phone]   CC: Charlott Rakes, [place] [address] Phone: [phone]  Fax: [phone]  Return to Endocrinology clinic as below: Future Appointments  Date Time Provider [place]  03/04/2019 10:50 AM , Melanie Crazier, MD [id number] None

## 2019-03-04 ENCOUNTER — Ambulatory Visit: Payer: BLUE CROSS/BLUE SHIELD | Admitting: Internal Medicine

## 2019-03-11 NOTE — Progress Notes (Deleted)
Name: Ashley Freeman  Age/ Sex: 28 y.o., female   MRN/ DOB: [id number], Apr 24, 1991     PCP: Hoy Register, MD   Reason for Endocrinology Evaluation: Type 2 Diabetes Mellitus  Initial Endocrine Consultative Visit: 06/27/18    PATIENT IDENTIFIER: Ashley Freeman is a 28 y.o. female with a past medical history of T1DM with multiple DKA's. The patient has followed with Endocrinology clinic since 06/27/2018 for consultative assistance with management of her diabetes.  DIABETIC HISTORY:  Ashley Freeman was diagnosed with T1DM at age 34. Hx of multiple DKA's due to non-compliance. Her hemoglobin A1c has ranged from  in 11.3% in 2011, peaking at 19.3% in 2015.   Works at [employer]  and senior living    SUBJECTIVE:   During the last visit (12/10/2018): A1c > 15.5%. We gave her a standing dose of humalog and changed lantus  Dose.   Today (03/11/2019): Ashley Freeman is here for a follow up on diabetes management.She checks her blood sugars 3-4 times daily, preprandial. The patient has had occasional hypoglycemic episodes since the last clinic visit, which typically occur in the morning. The patient is symptomatic with these episodes.   ROS: As per HPI and as detailed below: Review of Systems  Constitutional: Negative for chills and fever.  HENT: Negative for congestion and sore throat.   Gastrointestinal: Negative for diarrhea and nausea.  Neurological: Positive for dizziness and headaches. Negative for tingling and tremors.      HOME DIABETES REGIMEN:   Lantus 24 units daily  Humalog at 6 units 3 times daily with meal     GLUCOSE LOG:  Date  Breakfast  Lunch  Supper Bedtime  11/25/18 157 125 230 151  7/27 137 133 220 109  7/26 85 124 121   7/25 127 175 106 163  7/24 183 184 163   7/23 220 156 106 175  7/22 193 180 73 111  7/21 220 89 208 198  7/20 326 72 119 176     HISTORY:  Past Medical History:  Past Medical History:  Diagnosis Date  . Diabetes  mellitus 2001   Diagnosed at age 88 ; Type I  . Diarrhea 05/30/2016  . DKA (diabetic ketoacidoses) (HCC) 08/19/2013  . DKA (diabetic ketoacidoses) (HCC) 05/30/2016  . Gonorrhea 08/2011   Treated in 09/2011   Past Surgical History:  Past Surgical History:  Procedure Laterality Date  . INCISION AND DRAINAGE PERIRECTAL ABSCESS Right 08/18/2013   Procedure: IRRIGATION AND DEBRIDEMENT GLUTEAL ABSCESS;  Surgeon: Axel Filler, MD;  Location: [facility] OR;  Service: General;  Laterality: Right;  . INCISION AND DRAINAGE PERIRECTAL ABSCESS Right 09/19/2013   Procedure: IRRIGATION AND DEBRIDEMENT RIGHT GLUTEAL AND LABIAL ABSCESSES;  Surgeon: Axel Filler, MD;  Location: [facility] OR;  Service: General;  Laterality: Right;  . INCISION AND DRAINAGE PERIRECTAL ABSCESS Right 09/24/2013   Procedure: IRRIGATION AND DEBRIDEMENT PERIRECTAL ABSCESS;  Surgeon: Cherylynn Ridges, MD;  Location: [facility] OR;  Service: General;  Laterality: Right;    Social History:  reports that she has never smoked. She has never used smokeless tobacco. She reports previous alcohol use. She reports that she does not use drugs. Family History:  Family History  Problem Relation Age of Onset  . Asthma Mother   . Gout Father   . Diabetes Paternal Grandmother   . Anesthesia problems Neg Hx      HOME MEDICATIONS: Allergies as of 03/12/2019      Reactions   Penicillins Hives, Rash  Has patient had a PCN reaction causing immediate rash, facial/tongue/throat swelling, SOB or lightheadedness with hypotension: Yes Has patient had a PCN reaction causing severe rash involving mucus membranes or skin necrosis: No Has patient had a PCN reaction that required hospitalization: Yes Has patient had a PCN reaction occurring within the last 10 years: No If all of the above answers are "NO", then may proceed with Cephalosporin use.   Benadryl [diphenhydramine] Itching   Doxycycline Itching      Medication List       Accurate as of March 11, 2019   9:21 AM. If you have any questions, ask your nurse or doctor.        blood glucose meter kit and supplies Kit Dispense based on patient and insurance preference. Use up to four times daily as directed. (FOR ICD-9 250.00, 250.01).   Dexcom G6 Receiver Devi 1 Device by Does not apply route as directed.   Dexcom G6 Sensor Misc 1 Device by Does not apply route as directed.   Dexcom G6 Transmitter Misc 1 Device by Does not apply route as directed.   doxycycline 100 MG capsule Commonly known as: VIBRAMYCIN Take 1 capsule (100 mg total) by mouth 2 (two) times daily.   ibuprofen 200 MG tablet Commonly known as: ADVIL Take 400 mg by mouth every 6 (six) hours as needed for moderate pain or cramping.   insulin aspart cartridge Commonly known as: NOVOLOG Inject 6 Units into the skin 3 (three) times daily with meals.   Insulin Glargine 100 UNIT/ML Solostar Pen Commonly known as: LANTUS Inject 24 Units into the skin daily. What changed: how much to take   insulin lispro 100 UNIT/ML cartridge Commonly known as: HUMALOG Inject 0.06 mLs (6 Units total) into the skin 3 (three) times daily with meals. What changed: additional instructions   lisinopril 2.5 MG tablet Commonly known as: ZESTRIL Take 1 tablet (2.5 mg total) by mouth daily.   metroNIDAZOLE 0.75 % vaginal gel Commonly known as: METROGEL VAGINAL Place 1 Applicatorful vaginally at bedtime.   metroNIDAZOLE 500 MG tablet Commonly known as: FLAGYL Take 1 tablet (500 mg total) by mouth 2 (two) times daily.   metroNIDAZOLE 500 MG tablet Commonly known as: FLAGYL Take 1 tablet (500 mg total) by mouth 2 (two) times daily.   naproxen 500 MG tablet Commonly known as: NAPROSYN Take 1 tablet (500 mg total) by mouth 2 (two) times daily.        OBJECTIVE:   Vital Signs: There were no vitals taken for this visit.  Wt Readings from Last 3 Encounters:  12/10/18 135 lb 3.2 oz (61.3 kg)  11/27/18 133 lb 9.6 oz (60.6 kg)   10/27/18 134 lb (60.8 kg)     Exam: General: Pt appears well and is in NAD  Neck: General: Supple without adenopathy. Thyroid: Thyroid size normal.  No goiter or nodules appreciated. No thyroid bruit.  Lungs: Clear with good BS bilat with no rales, rhonchi, or wheezes  Heart: RRR with normal S1 and S2 and no gallops; no murmurs; no rub  Abdomen: Normoactive bowel sounds, soft, nontender, without masses or organomegaly palpable  Extremities: No pretibial edema. No tremor.  Skin: Normal texture and temperature to palpation. No rash noted.  Neuro: MS is good with appropriate affect, pt is alert and Ox3   DM foot exam: 06/07/2018 The skin of the feet is intact without sores or ulcerations. The pedal pulses are 2+ on right and 2+ on left. The sensation  is intact to a screening 5.07, 10 gram monofilament bilaterally     DATA REVIEWED:  Lab Results  Component Value Date   HGBA1C 16.8 (H) 10/24/2018   HGBA1C  06/04/2018     Comment:     >15   HGBA1C 15.5 (H) 06/19/2017   Lab Results  Component Value Date   MICROALBUR 7.2 03/27/2016   LDLCALC 192 (H) 03/27/2016   CREATININE 0.80 11/28/2018   Lab Results  Component Value Date   MICRALBCREAT 48 (H) 03/27/2016     Lab Results  Component Value Date   CHOL 272 (H) 03/27/2016   HDL 47 (L) 03/27/2016   LDLCALC 192 (H) 03/27/2016   TRIG 163 (H) 03/27/2016   CHOLHDL 5.8 (H) 03/27/2016         ASSESSMENT / PLAN / RECOMMENDATIONS:   1) Type 1 Diabetes Mellitus, Poorly controlled, Without complications - Most recent A1c of 16.8 %. Goal A1c < 7.0 %.    Plan:  - Pt with continued hyperglycemia due to medication conc-compliance, she has been doing better the past few weeks.  - Her BG's in the past week have been acceptable, she does states early morning hypoglycemia - Pt is requiring minimal amount of prandial at this time and this is due to being on way too much basal insulin, pt is riding on her basal insulin which put her at  risk for hypoglycemia when she doesn;t eat as much or skips meals.  - She was advised to take Lantus once a day as its a 24-hr medicine and no need to divide the dose with small doses.  - She was provided with a prescription for Dexcom  - She is interested in the in-pen   MEDICATIONS:  Decrease Lantus to 24 units ONCE daily   Humalog 6 units with each meal ( can use 4 units for small meal and 8 units for a large meal)  EDUCATION / INSTRUCTIONS:  BG monitoring instructions: Patient is instructed to check her blood sugars 4 times a day, before meals and bedtime.  Call [facility] [place] [facility] [facility] clinic if: BG persistently < 70 or > 300. . I reviewed the Rule of 15 for the treatment of hypoglycemia in detail with the patient. Literature supplied.     F/U in 3 months    Signed electronically by: Lyndle Herrlich, MD  [facility] Endocrinology  [facility] [address] Laure[address] [place], [place] [address] Phone: [phone] FAX: [phone]   CC: Hoy Register, MD [address] [place] [address] Phone: [phone]  Fax: [phone]  Return to Endocrinology clinic as below: Future Appointments  Date Time Provider Department Center  03/12/2019  9:50 AM Jameyah Fennewald, Konrad Dolores, MD [id number] None

## 2019-03-12 ENCOUNTER — Ambulatory Visit: Payer: BLUE CROSS/BLUE SHIELD | Admitting: Internal Medicine

## 2019-03-26 ENCOUNTER — Other Ambulatory Visit: Payer: Self-pay

## 2019-03-26 ENCOUNTER — Inpatient Hospital Stay (HOSPITAL_COMMUNITY)
Admission: AD | Admit: 2019-03-26 | Discharge: 2019-03-26 | Disposition: A | Discharge status: Home / Self Care | Payer: BLUE CROSS/BLUE SHIELD | Attending: Obstetrics and Gynecology | Admitting: Obstetrics and Gynecology

## 2019-03-26 ENCOUNTER — Inpatient Hospital Stay (HOSPITAL_COMMUNITY): Payer: BLUE CROSS/BLUE SHIELD

## 2019-03-26 ENCOUNTER — Encounter (HOSPITAL_COMMUNITY): Payer: Self-pay | Admitting: *Deleted

## 2019-03-26 DIAGNOSIS — O99891 Other specified diseases and conditions complicating pregnancy: Secondary | ICD-10-CM | POA: Diagnosis not present

## 2019-03-26 DIAGNOSIS — E1010 Type 1 diabetes mellitus with ketoacidosis without coma: Secondary | ICD-10-CM | POA: Insufficient documentation

## 2019-03-26 DIAGNOSIS — O3680X0 Pregnancy with inconclusive fetal viability, not applicable or unspecified: Secondary | ICD-10-CM | POA: Insufficient documentation

## 2019-03-26 DIAGNOSIS — O161 Unspecified maternal hypertension, first trimester: Secondary | ICD-10-CM | POA: Diagnosis not present

## 2019-03-26 DIAGNOSIS — O26891 Other specified pregnancy related conditions, first trimester: Secondary | ICD-10-CM

## 2019-03-26 DIAGNOSIS — O24011 Pre-existing diabetes mellitus, type 1, in pregnancy, first trimester: Secondary | ICD-10-CM | POA: Diagnosis not present

## 2019-03-26 DIAGNOSIS — Z794 Long term (current) use of insulin: Secondary | ICD-10-CM | POA: Insufficient documentation

## 2019-03-26 DIAGNOSIS — Z79899 Other long term (current) drug therapy: Secondary | ICD-10-CM | POA: Diagnosis not present

## 2019-03-26 DIAGNOSIS — Z888 Allergy status to other drugs, medicaments and biological substances status: Secondary | ICD-10-CM | POA: Diagnosis not present

## 2019-03-26 DIAGNOSIS — Z881 Allergy status to other antibiotic agents status: Secondary | ICD-10-CM | POA: Diagnosis not present

## 2019-03-26 DIAGNOSIS — Z791 Long term (current) use of non-steroidal anti-inflammatories (NSAID): Secondary | ICD-10-CM | POA: Diagnosis not present

## 2019-03-26 DIAGNOSIS — Z880 Allergy status to penicillin: Secondary | ICD-10-CM | POA: Diagnosis not present

## 2019-03-26 DIAGNOSIS — R12 Heartburn: Secondary | ICD-10-CM | POA: Diagnosis not present

## 2019-03-26 DIAGNOSIS — Z792 Long term (current) use of antibiotics: Secondary | ICD-10-CM | POA: Insufficient documentation

## 2019-03-26 DIAGNOSIS — R109 Unspecified abdominal pain: Secondary | ICD-10-CM

## 2019-03-26 LAB — COMPREHENSIVE METABOLIC PANEL
ALT: 13 U/L (ref 0–44)
AST: 17 U/L (ref 15–41)
Albumin: 3.5 g/dL (ref 3.5–5.0)
Alkaline Phosphatase: 35 U/L — ABNORMAL LOW (ref 38–126)
Anion gap: 11 (ref 5–15)
BUN: 22 mg/dL — ABNORMAL HIGH (ref 6–20)
CO2: 19 mmol/L — ABNORMAL LOW (ref 22–32)
Calcium: 8.6 mg/dL — ABNORMAL LOW (ref 8.9–10.3)
Chloride: 105 mmol/L (ref 98–111)
Creatinine, Ser: 0.99 mg/dL (ref 0.44–1.00)
GFR calc Af Amer: >60 mL/min (ref >60)
GFR calc non Af Amer: >60 mL/min (ref >60)
Glucose, Bld: 222 mg/dL — ABNORMAL HIGH (ref 70–99)
Potassium: 4.1 mmol/L (ref 3.5–5.1)
Sodium: 135 mmol/L (ref 135–145)
Total Bilirubin: 0.7 mg/dL (ref 0.3–1.2)
Total Protein: 6.7 g/dL (ref 6.5–8.1)

## 2019-03-26 LAB — WET PREP, GENITAL
Sperm: NONE SEEN
Trich, Wet Prep: NONE SEEN

## 2019-03-26 LAB — HCG, QUANTITATIVE, PREGNANCY: hCG, Beta Chain, Quant, S: 2168 m[IU]/mL — ABNORMAL HIGH (ref <5)

## 2019-03-26 LAB — POCT PREGNANCY, URINE: Preg Test, Ur: POSITIVE — AB

## 2019-03-26 LAB — CBC
HCT: 30.8 % — ABNORMAL LOW (ref 36.0–46.0)
Hemoglobin: 10.3 g/dL — ABNORMAL LOW (ref 12.0–15.0)
MCH: 27.4 pg (ref 26.0–34.0)
MCHC: 33.4 g/dL (ref 30.0–36.0)
MCV: 81.9 fL (ref 80.0–100.0)
Platelets: 299 10*3/uL (ref 150–400)
RBC: 3.76 MIL/uL — ABNORMAL LOW (ref 3.87–5.11)
RDW: 13.1 % (ref 11.5–15.5)
WBC: 8 10*3/uL (ref 4.0–10.5)
nRBC: 0 % (ref 0.0–0.2)

## 2019-03-26 LAB — URINALYSIS, ROUTINE W REFLEX MICROSCOPIC
Bilirubin Urine: NEGATIVE
Glucose, UA: NEGATIVE mg/dL
Ketones, ur: NEGATIVE mg/dL
Nitrite: NEGATIVE
Protein, ur: 30 mg/dL — AB
Specific Gravity, Urine: 1.01 (ref 1.005–1.030)
pH: 6 (ref 5.0–8.0)

## 2019-03-26 LAB — HIV ANTIBODY (ROUTINE TESTING W REFLEX): HIV Screen 4th Generation wRfx: NONREACTIVE

## 2019-03-26 MED ORDER — FAMOTIDINE 20 MG PO TABS: 20.0000 mg | ORAL_TABLET | Freq: Two times a day (BID) | ORAL | 2 refills | Status: DC

## 2019-03-26 NOTE — Progress Notes (Signed)
Fatima Blank CNM in earlier to discuss test result and d/c plan. Written and verbal d/c instructions given and understanding voiced

## 2019-03-26 NOTE — MAU Provider Note (Signed)
Chief Complaint: Possible Pregnancy   None     SUBJECTIVE HPI: Ashley Freeman is a 28 y.o. G2P0010 at Unknown by LMP who presents to maternity admissions reporting positive pregnancy test at home last night, nausea x 1 week, and onset of abdominal cramping today.  She reports the pain is low in her abdomen, more to the right side, cramping intermittent pain that does not radiate. She also reports daily heartburn.  She has not tried any treatments. There are no other associated symptoms.    HPI  Past Medical History:  Diagnosis Date  . Diabetes mellitus 2001   Diagnosed at age 31 ; Type I  . Diarrhea 05/30/2016  . DKA (diabetic ketoacidoses) ([place]) 08/19/2013  . DKA (diabetic ketoacidoses) ([place]) 05/30/2016  . Gonorrhea 08/2011   Treated in 09/2011   Past Surgical History:  Procedure Laterality Date  . INCISION AND DRAINAGE PERIRECTAL ABSCESS Right 08/18/2013   Procedure: IRRIGATION AND DEBRIDEMENT GLUTEAL ABSCESS;  Surgeon: Ralene Ok, MD;  Location: [facility];  Service: General;  Laterality: Right;  . INCISION AND DRAINAGE PERIRECTAL ABSCESS Right 09/19/2013   Procedure: IRRIGATION AND DEBRIDEMENT RIGHT GLUTEAL AND LABIAL ABSCESSES;  Surgeon: Ralene Ok, MD;  Location: [facility];  Service: General;  Laterality: Right;  . INCISION AND DRAINAGE PERIRECTAL ABSCESS Right 09/24/2013   Procedure: IRRIGATION AND DEBRIDEMENT PERIRECTAL ABSCESS;  Surgeon: Gwenyth Ober, MD;  Location: [facility];  Service: General;  Laterality: Right;   Social History   Socioeconomic History  . Marital status: Single    Spouse name: Not on file  . Number of children: 0  . Years of education: 11th grade  . Highest education level: Not on file  Occupational History  . 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 Financial resource strain: Not on file  . Food insecurity    Worry: Not on file    Inability: Not on file  . Transportation needs    Medical: Not on file     Non-medical: Not on file  Tobacco Use  . Smoking status: Never Smoker  . Smokeless tobacco: Never Used  Substance and Sexual Activity  . Alcohol use: Not Currently  . Drug use: No  . Sexual activity: Yes    Birth control/protection: None  Lifestyle  . Physical activity    Days per week: Not on file    Minutes per session: Not on file  . Stress: Not on file  Relationships  . Social Herbalist on phone: Not on file    Gets together: Not on file    Attends religious service: Not on file    Active member of club or organization: Not on file    Attends meetings of clubs or organizations: Not on file    Relationship status: Not on file  . Intimate partner violence    Fear of current or ex partner: Not on file    Emotionally abused: Not on file    Physically abused: Not on file    Forced sexual activity: Not on file  Other Topics Concern  . Not on file  Social History Narrative   Patient lives in [place] mother lives in [place].  Unemployed.  Previously worked for a IT consultant.  Completed 11 grade working on [employer]. Patient 3 brothers    No current facility-administered medications on file prior to encounter.    Current Outpatient Medications on File Prior to Encounter  Medication Sig Dispense  Refill  . insulin aspart (NOVOLOG) cartridge Inject 6 Units into the skin 3 (three) times daily with meals. 15 mL 11  . blood glucose meter kit and supplies KIT Dispense based on patient and insurance preference. Use up to four times daily as directed. (FOR ICD-9 250.00, 250.01). 1 each 0  . Continuous Blood Gluc Receiver (DEXCOM G6 RECEIVER) DEVI 1 Device by Does not apply route as directed. 1 Device 0  . Continuous Blood Gluc Sensor (DEXCOM G6 SENSOR) MISC 1 Device by Does not apply route as directed. 3 each 6  . Continuous Blood Gluc Transmit (DEXCOM G6 TRANSMITTER) MISC 1 Device by Does not apply route as directed. 1 each 3  . doxycycline (VIBRAMYCIN) 100 MG capsule  Take 1 capsule (100 mg total) by mouth 2 (two) times daily. 28 capsule 0  . ibuprofen (ADVIL) 200 MG tablet Take 400 mg by mouth every 6 (six) hours as needed for moderate pain or cramping.    . Insulin Glargine (LANTUS) 100 UNIT/ML Solostar Pen Inject 24 Units into the skin daily. (Patient taking differently: Inject 26 Units into the skin daily. ) 15 mL 0  . insulin lispro (HUMALOG) 100 UNIT/ML cartridge Inject 0.06 mLs (6 Units total) into the skin 3 (three) times daily with meals. (Patient taking differently: Inject 6 Units into the skin 3 (three) times daily with meals. As needed) 18 mL 11  . lisinopril (ZESTRIL) 2.5 MG tablet Take 1 tablet (2.5 mg total) by mouth daily. 30 tablet 3  . metroNIDAZOLE (FLAGYL) 500 MG tablet Take 1 tablet (500 mg total) by mouth 2 (two) times daily. (Patient not taking: Reported on 12/10/2018) 28 tablet 0  . metroNIDAZOLE (FLAGYL) 500 MG tablet Take 1 tablet (500 mg total) by mouth 2 (two) times daily. 14 tablet 0  . metroNIDAZOLE (METROGEL VAGINAL) 0.75 % vaginal gel Place 1 Applicatorful vaginally at bedtime. 70 g 0  . naproxen (NAPROSYN) 500 MG tablet Take 1 tablet (500 mg total) by mouth 2 (two) times daily. 10 tablet 0   Allergies  Allergen Reactions  . Penicillins Hives and Rash    Has patient had a PCN reaction causing immediate rash, facial/tongue/throat swelling, SOB or lightheadedness with hypotension: Yes Has patient had a PCN reaction causing severe rash involving mucus membranes or skin necrosis: No Has patient had a PCN reaction that required hospitalization: Yes Has patient had a PCN reaction occurring within the last 10 years: No If all of the above answers are "NO", then may proceed with Cephalosporin use.  . Benadryl [Diphenhydramine] Itching  . Doxycycline Itching    ROS:  Review of Systems  Constitutional: Negative for chills, fatigue and fever.  Respiratory: Negative for shortness of breath.   Cardiovascular: Negative for chest pain.   Gastrointestinal: Positive for abdominal pain and nausea. Negative for vomiting.  Genitourinary: Negative for difficulty urinating, dysuria, flank pain, pelvic pain, vaginal bleeding, vaginal discharge and vaginal pain.  Neurological: Negative for dizziness and headaches.  Psychiatric/Behavioral: Negative.      I have reviewed patient's Past Medical Hx, Surgical Hx, Family Hx, Social Hx, medications and allergies.   Physical Exam   Patient Vitals for the past 24 hrs:  BP Temp Pulse Resp SpO2  03/26/19 0113 (!) 144/91 - (!) 114 - -  03/26/19 0038 (!) 143/90 98.2 F (36.8 C) (!) 116 18 100 %   Constitutional: Well-developed, well-nourished female in no acute distress.  Cardiovascular: normal rate Respiratory: normal effort GI: Abd soft, non-tender. Pos BS  x 4 MS: Extremities nontender, no edema, normal ROM Neurologic: Alert and oriented x 4.  GU: Neg CVAT.  PELVIC EXAM: Wet prep and GCC collected by blind swab   LAB RESULTS Results for orders placed or performed during the hospital encounter of 03/26/19 (from the past 24 hour(s))  Urinalysis, Routine w reflex microscopic     Status: Abnormal   Collection Time: 03/26/19 12:45 AM  Result Value Ref Range   Color, Urine YELLOW YELLOW   APPearance HAZY (A) CLEAR   Specific Gravity, Urine 1.010 1.005 - 1.030   pH 6.0 5.0 - 8.0   Glucose, UA NEGATIVE NEGATIVE mg/dL   Hgb urine dipstick SMALL (A) NEGATIVE   Bilirubin Urine NEGATIVE NEGATIVE   Ketones, ur NEGATIVE NEGATIVE mg/dL   Protein, ur 30 (A) NEGATIVE mg/dL   Nitrite NEGATIVE NEGATIVE   Leukocytes,Ua SMALL (A) NEGATIVE   RBC / HPF 0-5 0 - 5 RBC/hpf   WBC, UA 21-50 0 - 5 WBC/hpf   Bacteria, UA RARE (A) NONE SEEN   Squamous Epithelial / LPF 11-20 0 - 5   Mucus PRESENT   Pregnancy, urine POC     Status: Abnormal   Collection Time: 03/26/19  1:00 AM  Result Value Ref Range   Preg Test, Ur POSITIVE (A) NEGATIVE  Wet prep, genital     Status: Abnormal   Collection Time:  03/26/19  1:20 AM   Specimen: PATH Cytology Cervicovaginal Ancillary Only  Result Value Ref Range   Yeast Wet Prep HPF POC PRESENT (A) NONE SEEN   Trich, Wet Prep NONE SEEN NONE SEEN   Clue Cells Wet Prep HPF POC PRESENT (A) NONE SEEN   WBC, Wet Prep HPF POC MANY (A) NONE SEEN   Sperm NONE SEEN   CBC     Status: Abnormal   Collection Time: 03/26/19  1:41 AM  Result Value Ref Range   WBC 8.0 4.0 - 10.5 K/uL   RBC 3.76 (L) 3.87 - 5.11 MIL/uL   Hemoglobin 10.3 (L) 12.0 - 15.0 g/dL   HCT 30.8 (L) 36.0 - 46.0 %   MCV 81.9 80.0 - 100.0 fL   MCH 27.4 26.0 - 34.0 pg   MCHC 33.4 30.0 - 36.0 g/dL   RDW 13.1 11.5 - 15.5 %   Platelets 299 150 - 400 K/uL   nRBC 0.0 0.0 - 0.2 %  Comprehensive metabolic panel     Status: Abnormal   Collection Time: 03/26/19  1:41 AM  Result Value Ref Range   Sodium 135 135 - 145 mmol/L   Potassium 4.1 3.5 - 5.1 mmol/L   Chloride 105 98 - 111 mmol/L   CO2 19 (L) 22 - 32 mmol/L   Glucose, Bld 222 (H) 70 - 99 mg/dL   BUN 22 (H) 6 - 20 mg/dL   Creatinine, Ser 0.99 0.44 - 1.00 mg/dL   Calcium 8.6 (L) 8.9 - 10.3 mg/dL   Total Protein 6.7 6.5 - 8.1 g/dL   Albumin 3.5 3.5 - 5.0 g/dL   AST 17 15 - 41 U/L   ALT 13 0 - 44 U/L   Alkaline Phosphatase 35 (L) 38 - 126 U/L   Total Bilirubin 0.7 0.3 - 1.2 mg/dL   GFR calc non Af Amer >60 >60 mL/min   GFR calc Af Amer >60 >60 mL/min   Anion gap 11 5 - 15       IMAGING US Ob Less Than 14 Weeks With Ob Transvaginal  Result Date: 03/26/2019  CLINICAL DATA:  Pelvic pain with history of positive pregnancy test EXAM: OBSTETRIC <14 WK [place] AND TRANSVAGINAL OB US TECHNIQUE: Both transabdominal and transvaginal ultrasound examinations were performed for complete evaluation of the gestation as well as the maternal uterus, adnexal regions, and pelvic cul-de-sac. Transvaginal technique was performed to assess early pregnancy. COMPARISON:  None. FINDINGS: Intrauterine gestational sac: Present Yolk sac:  Absent Embryo:  Absent MSD:  3.4 mm Dates: 5 weeks 0 days Subchorionic hemorrhage:  None visualized. Maternal uterus/adnexae: Ovaries are well visualized bilaterally. Small corpus luteum cyst is noted within the right ovary. No evidence of ovarian torsion is seen. IMPRESSION: Probable early intrauterine gestational sac, but no yolk sac, fetal pole, or cardiac activity yet visualized. Recommend follow-up quantitative B-HCG levels and follow-up US in 14 days to assess viability. This recommendation follows SRU consensus guidelines: Diagnostic Criteria for Nonviable Pregnancy Early in the First Trimester. [place] Med 2013; 997:7414-23. Electronically Signed   By: Inez Catalina M.D.   On: 03/26/2019 02:21    MAU Management/MDM: Orders Placed This Encounter  Procedures  . Wet prep, genital  . US OB LESS THAN 14 WEEKS WITH OB TRANSVAGINAL  . Urinalysis, Routine w reflex microscopic  . CBC  . hCG, quantitative, pregnancy  . HIV antibody  . Comprehensive metabolic panel  . Pregnancy, urine POC  . Discharge patient    Meds ordered this encounter  Medications  . famotidine (PEPCID) 20 MG tablet    Sig: Take 1 tablet (20 mg total) by mouth 2 (two) times daily.    Dispense:  30 tablet    Refill:  2    Order Specific Question:   Supervising Provider    Answer:   Sloan Leiter [[id number]]    Findings today could represent a normal early pregnancy, spontaneous abortion or ectopic pregnancy which can be life-threatening.  Ectopic precautions were given to the patient with plan to return in 48 hours for repeat quant hcg to evaluate pregnancy development. Pt with HTN, borderline today 140s/90s. Pt not taking BP meds at this time.  Will reevaluate at early prenatal care.  Pepcid 20 mg BID for heartburn Pt discharged with strict ectopic precautions.  ASSESSMENT 1. Pregnancy of unknown anatomic location   2. Abdominal pain during pregnancy in first trimester   3. Hypertension affecting pregnancy in first trimester   4. Pre-existing  type 1 diabetes mellitus during pregnancy in first trimester   5. Heartburn during pregnancy in first trimester     PLAN Discharge home Allergies as of 03/26/2019      Reactions   Penicillins Hives, Rash   Has patient had a PCN reaction causing immediate rash, facial/tongue/throat swelling, SOB or lightheadedness with hypotension: Yes Has patient had a PCN reaction causing severe rash involving mucus membranes or skin necrosis: No Has patient had a PCN reaction that required hospitalization: Yes Has patient had a PCN reaction occurring within the last 10 years: No If all of the above answers are "NO", then may proceed with Cephalosporin use.   Benadryl [diphenhydramine] Itching   Doxycycline Itching      Medication List    STOP taking these medications   doxycycline 100 MG capsule Commonly known as: VIBRAMYCIN   ibuprofen 200 MG tablet Commonly known as: ADVIL   lisinopril 2.5 MG tablet Commonly known as: ZESTRIL   metroNIDAZOLE 0.75 % vaginal gel Commonly known as: METROGEL VAGINAL   metroNIDAZOLE 500 MG tablet Commonly known as: FLAGYL   naproxen 500 MG  tablet Commonly known as: NAPROSYN     TAKE these medications   blood glucose meter kit and supplies Kit Dispense based on patient and insurance preference. Use up to four times daily as directed. (FOR ICD-9 250.00, 250.01).   Dexcom G6 Receiver Devi 1 Device by Does not apply route as directed.   Dexcom G6 Sensor Misc 1 Device by Does not apply route as directed.   Dexcom G6 Transmitter Misc 1 Device by Does not apply route as directed.   famotidine 20 MG tablet Commonly known as: PEPCID Take 1 tablet (20 mg total) by mouth 2 (two) times daily.   insulin aspart cartridge Commonly known as: NOVOLOG Inject 6 Units into the skin 3 (three) times daily with meals.   Insulin Glargine 100 UNIT/ML Solostar Pen Commonly known as: LANTUS Inject 24 Units into the skin daily. What changed: how much to take    insulin lispro 100 UNIT/ML cartridge Commonly known as: HUMALOG Inject 0.06 mLs (6 Units total) into the skin 3 (three) times daily with meals. What changed: additional instructions      Follow-up Information    [facility] Follow up.   Why: Return to MAU on Friday night, 03/27/19, or Saturday, 03/28/19, in the morning for repeat labwork. Return sooner as needed for emergencies.  Contact information: [place] [phone] [place] Certified Nurse-Midwife 03/26/2019  2:50 AM

## 2019-03-26 NOTE — MAU Note (Signed)
Had two positive upts. I have been having lower abd pain for an hour. Was really bad at first but easing some now. Denies vag bleeding or d/c. Denies dysuria but states it is "harder to pee". Last BM was 2 days ago

## 2019-03-27 ENCOUNTER — Other Ambulatory Visit: Payer: Self-pay

## 2019-03-27 ENCOUNTER — Inpatient Hospital Stay (HOSPITAL_COMMUNITY)
Admission: AD | Admit: 2019-03-27 | Discharge: 2019-03-27 | Disposition: A | Discharge status: Home / Self Care | Payer: BLUE CROSS/BLUE SHIELD | Attending: Obstetrics and Gynecology | Admitting: Obstetrics and Gynecology

## 2019-03-27 DIAGNOSIS — O26891 Other specified pregnancy related conditions, first trimester: Secondary | ICD-10-CM | POA: Insufficient documentation

## 2019-03-27 DIAGNOSIS — R109 Unspecified abdominal pain: Secondary | ICD-10-CM | POA: Diagnosis not present

## 2019-03-27 DIAGNOSIS — Z3A00 Weeks of gestation of pregnancy not specified: Secondary | ICD-10-CM | POA: Insufficient documentation

## 2019-03-27 DIAGNOSIS — O020 Blighted ovum and nonhydatidiform mole: Secondary | ICD-10-CM

## 2019-03-27 DIAGNOSIS — O3680X0 Pregnancy with inconclusive fetal viability, not applicable or unspecified: Secondary | ICD-10-CM

## 2019-03-27 LAB — HCG, QUANTITATIVE, PREGNANCY: hCG, Beta Chain, Quant, S: 3752 m[IU]/mL — ABNORMAL HIGH (ref <5)

## 2019-03-27 LAB — GC/CHLAMYDIA PROBE AMP (~~LOC~~) NOT AT ARMC
Chlamydia: NEGATIVE
Comment: NEGATIVE
Comment: NORMAL
Neisseria Gonorrhea: NEGATIVE

## 2019-03-27 NOTE — MAU Note (Signed)
Pt is here for repeat HCG.Has some abdominal cramping - rating 5/10. She reports no bleeding. Has some nausea.

## 2019-03-27 NOTE — Discharge Instructions (Signed)
First Trimester of Pregnancy The first trimester of pregnancy is from week 1 until the end of week 13 (months 1 through 3). A week after a sperm fertilizes an egg, the egg will implant on the wall of the uterus. This embryo will begin to develop into a baby. Genes from you and your partner will form the baby. The female genes will determine whether the baby will be a boy or a girl. At 6-8 weeks, the eyes and face will be formed, and the heartbeat can be seen on ultrasound. At the end of 12 weeks, all the baby's organs will be formed. Now that you are pregnant, you will want to do everything you can to have a healthy baby. Two of the most important things are to get good prenatal care and to follow your health care provider's instructions. Prenatal care is all the medical care you receive before the baby's birth. This care will help prevent, find, and treat any problems during the pregnancy and childbirth. Body changes during your first trimester Your body goes through many changes during pregnancy. The changes vary from woman to woman.  You may gain or lose a couple of pounds at first.  You may feel sick to your stomach (nauseous) and you may throw up (vomit). If the vomiting is uncontrollable, call your health care provider.  You may tire easily.  You may develop headaches that can be relieved by medicines. All medicines should be approved by your health care provider.  You may urinate more often. Painful urination may mean you have a bladder infection.  You may develop heartburn as a result of your pregnancy.  You may develop constipation because certain hormones are causing the muscles that push stool through your intestines to slow down.  You may develop hemorrhoids or swollen veins (varicose veins).  Your breasts may begin to grow larger and become tender. Your nipples may stick out more, and the tissue that surrounds them (areola) may become darker.  Your gums may bleed and may be  sensitive to brushing and flossing.  Dark spots or blotches (chloasma, mask of pregnancy) may develop on your face. This will likely fade after the baby is born.  Your menstrual periods will stop.  You may have a loss of appetite.  You may develop cravings for certain kinds of food.  You may have changes in your emotions from day to day, such as being excited to be pregnant or being concerned that something may go wrong with the pregnancy and baby.  You may have more vivid and strange dreams.  You may have changes in your hair. These can include thickening of your hair, rapid growth, and changes in texture. Some women also have hair loss during or after pregnancy, or hair that feels dry or thin. Your hair will most likely return to normal after your baby is born. What to expect at prenatal visits During a routine prenatal visit:  You will be weighed to make sure you and the baby are growing normally.  Your blood pressure will be taken.  Your abdomen will be measured to track your baby's growth.  The fetal heartbeat will be listened to between weeks 10 and 14 of your pregnancy.  Test results from any previous visits will be discussed. Your health care provider may ask you:  How you are feeling.  If you are feeling the baby move.  If you have had any abnormal symptoms, such as leaking fluid, bleeding, severe headaches, or abdominal   cramping.  If you are using any tobacco products, including cigarettes, chewing tobacco, and electronic cigarettes.  If you have any questions. Other tests that may be performed during your first trimester include:  Blood tests to find your blood type and to check for the presence of any previous infections. The tests will also be used to check for low iron levels (anemia) and protein on red blood cells (Rh antibodies). Depending on your risk factors, or if you previously had diabetes during pregnancy, you may have tests to check for high blood sugar  that affects pregnant women (gestational diabetes).  Urine tests to check for infections, diabetes, or protein in the urine.  An ultrasound to confirm the proper growth and development of the baby.  Fetal screens for spinal cord problems (spina bifida) and Down syndrome.  HIV (human immunodeficiency virus) testing. Routine prenatal testing includes screening for HIV, unless you choose not to have this test.  You may need other tests to make sure you and the baby are doing well. Follow these instructions at home: Medicines  Follow your health care provider's instructions regarding medicine use. Specific medicines may be either safe or unsafe to take during pregnancy.  Take a prenatal vitamin that contains at least 600 micrograms (mcg) of folic acid.  If you develop constipation, try taking a stool softener if your health care provider approves. Eating and drinking   Eat a balanced diet that includes fresh fruits and vegetables, whole grains, good sources of protein such as meat, eggs, or tofu, and low-fat dairy. Your health care provider will help you determine the amount of weight gain that is right for you.  Avoid raw meat and uncooked cheese. These carry germs that can cause birth defects in the baby.  Eating four or five small meals rather than three large meals a day may help relieve nausea and vomiting. If you start to feel nauseous, eating a few soda crackers can be helpful. Drinking liquids between meals, instead of during meals, also seems to help ease nausea and vomiting.  Limit foods that are high in fat and processed sugars, such as fried and sweet foods.  To prevent constipation: ? Eat foods that are high in fiber, such as fresh fruits and vegetables, whole grains, and beans. ? Drink enough fluid to keep your urine clear or pale yellow. Activity  Exercise only as directed by your health care provider. Most women can continue their usual exercise routine during  pregnancy. Try to exercise for 30 minutes at least 5 days a week. Exercising will help you: ? Control your weight. ? Stay in shape. ? Be prepared for labor and delivery.  Experiencing pain or cramping in the lower abdomen or lower back is a good sign that you should stop exercising. Check with your health care provider before continuing with normal exercises.  Try to avoid standing for long periods of time. Move your legs often if you must stand in one place for a long time.  Avoid heavy lifting.  Wear low-heeled shoes and practice good posture.  You may continue to have sex unless your health care provider tells you not to. Relieving pain and discomfort  Wear a good support bra to relieve breast tenderness.  Take warm sitz baths to soothe any pain or discomfort caused by hemorrhoids. Use hemorrhoid cream if your health care provider approves.  Rest with your legs elevated if you have leg cramps or low back pain.  If you develop varicose veins in   your legs, wear support hose. Elevate your feet for 15 minutes, 3-4 times a day. Limit salt in your diet. Prenatal care  Schedule your prenatal visits by the twelfth week of pregnancy. They are usually scheduled monthly at first, then more often in the last 2 months before delivery.  Write down your questions. Take them to your prenatal visits.  Keep all your prenatal visits as told by your health care provider. This is important. Safety  Wear your seat belt at all times when driving.  Make a list of emergency phone numbers, including numbers for family, friends, the hospital, and police and fire departments. General instructions  Ask your health care provider for a referral to a local prenatal education class. Begin classes no later than the beginning of month 6 of your pregnancy.  Ask for help if you have counseling or nutritional needs during pregnancy. Your health care provider can offer advice or refer you to specialists for help  with various needs.  Do not use hot tubs, steam rooms, or saunas.  Do not douche or use tampons or scented sanitary pads.  Do not cross your legs for long periods of time.  Avoid cat litter boxes and soil used by cats. These carry germs that can cause birth defects in the baby and possibly loss of the fetus by miscarriage or stillbirth.  Avoid all smoking, herbs, alcohol, and medicines not prescribed by your health care provider. Chemicals in these products affect the formation and growth of the baby.  Do not use any products that contain nicotine or tobacco, such as cigarettes and e-cigarettes. If you need help quitting, ask your health care provider. You may receive counseling support and other resources to help you quit.  Schedule a dentist appointment. At home, brush your teeth with a soft toothbrush and be gentle when you floss. Contact a health care provider if:  You have dizziness.  You have mild pelvic cramps, pelvic pressure, or nagging pain in the abdominal area.  You have persistent nausea, vomiting, or diarrhea.  You have a bad smelling vaginal discharge.  You have pain when you urinate.  You notice increased swelling in your face, hands, legs, or ankles.  You are exposed to fifth disease or chickenpox.  You are exposed to German measles (rubella) and have never had it. Get help right away if:  You have a fever.  You are leaking fluid from your vagina.  You have spotting or bleeding from your vagina.  You have severe abdominal cramping or pain.  You have rapid weight gain or loss.  You vomit blood or material that looks like coffee grounds.  You develop a severe headache.  You have shortness of breath.  You have any kind of trauma, such as from a fall or a car accident. Summary  The first trimester of pregnancy is from week 1 until the end of week 13 (months 1 through 3).  Your body goes through many changes during pregnancy. The changes vary from  woman to woman.  You will have routine prenatal visits. During those visits, your health care provider will examine you, discuss any test results you may have, and talk with you about how you are feeling. This information is not intended to replace advice given to you by your health care provider. Make sure you discuss any questions you have with your health care provider. Document Released: 04/10/2001 Document Revised: 03/29/2017 Document Reviewed: 03/28/2016 Elsevier Patient Education  2020 [employer]  

## 2019-03-27 NOTE — MAU Provider Note (Signed)
None     S Ms. MARTINI BRASSELL is a 28 y.o. G2P0010 early pregnant female who presents to MAU today for follow up labwork. Patient does report some intermittent left side abdominal cramping, but states it is manageable.  She denies vaginal bleeding.   O BP 126/83 (BP Location: Right Arm)   Pulse (!) 116   Temp 98.1 F (36.7 C) (Oral)   Resp 20   LMP  (LMP Unknown)   SpO2 100% Comment: room air Physical Exam  Constitutional: She is oriented to person, place, and time. She appears well-developed and well-nourished. No distress.  HENT:  Head: Normocephalic and atraumatic.  Eyes: Conjunctivae are normal.  Neck: Normal range of motion.  Cardiovascular: Normal rate.  Respiratory: Effort normal.  Musculoskeletal: Normal range of motion.  Neurological: She is alert and oriented to person, place, and time.  Psychiatric: She has a normal mood and affect. Her behavior is normal.   Results for orders placed or performed during the hospital encounter of 03/27/19 (from the past 24 hour(s))  hCG, quantitative, pregnancy     Status: Abnormal   Collection Time: 03/27/19 12:15 PM  Result Value Ref Range   hCG, Beta Chain, Quant, S 3,752 (H) <5 mIU/mL     A Early pregnant female Repeat Quant Medical screening exam complete   P Repeat quant with appropriate rise in levels. Discussed need for follow up ultrasound in 7-10 days. Informed radiology will contact to schedule appt Reviewed usage of tylenol for cramping Bleeding Precautions Given Discharge from MAU in stable condition Warning signs for worsening condition that would warrant emergency follow-up discussed Patient may return to MAU as needed for pregnancy related complaints  Gavin Pound, CNM 03/27/2019 1:55 PM

## 2019-03-30 ENCOUNTER — Telehealth: Payer: Self-pay

## 2019-03-30 NOTE — Telephone Encounter (Signed)
Patient called in stating she is now pregnant [redacted] weeks. And says her sugar levels keep dropping 55 -65. She wants to know if she needs to chmage insulin or make an appt to come in     Please advise

## 2019-03-30 NOTE — Telephone Encounter (Signed)
Pt was last seen in July, please call and schedule appt, thank you

## 2019-04-01 ENCOUNTER — Inpatient Hospital Stay (HOSPITAL_COMMUNITY)
Admission: AD | Admit: 2019-04-01 | Discharge: 2019-04-01 | Disposition: A | Discharge status: Home / Self Care | Payer: BLUE CROSS/BLUE SHIELD | Attending: Obstetrics & Gynecology | Admitting: Obstetrics & Gynecology

## 2019-04-01 ENCOUNTER — Other Ambulatory Visit: Payer: Self-pay

## 2019-04-01 ENCOUNTER — Encounter (HOSPITAL_COMMUNITY): Payer: Self-pay

## 2019-04-01 DIAGNOSIS — O2341 Unspecified infection of urinary tract in pregnancy, first trimester: Secondary | ICD-10-CM | POA: Insufficient documentation

## 2019-04-01 DIAGNOSIS — Z3A00 Weeks of gestation of pregnancy not specified: Secondary | ICD-10-CM | POA: Diagnosis not present

## 2019-04-01 DIAGNOSIS — Z881 Allergy status to other antibiotic agents status: Secondary | ICD-10-CM | POA: Diagnosis not present

## 2019-04-01 DIAGNOSIS — O26891 Other specified pregnancy related conditions, first trimester: Secondary | ICD-10-CM | POA: Diagnosis present

## 2019-04-01 DIAGNOSIS — Z794 Long term (current) use of insulin: Secondary | ICD-10-CM | POA: Diagnosis not present

## 2019-04-01 DIAGNOSIS — N390 Urinary tract infection, site not specified: Secondary | ICD-10-CM

## 2019-04-01 DIAGNOSIS — R110 Nausea: Secondary | ICD-10-CM | POA: Diagnosis not present

## 2019-04-01 DIAGNOSIS — R197 Diarrhea, unspecified: Secondary | ICD-10-CM | POA: Diagnosis not present

## 2019-04-01 DIAGNOSIS — K219 Gastro-esophageal reflux disease without esophagitis: Secondary | ICD-10-CM | POA: Insufficient documentation

## 2019-04-01 DIAGNOSIS — Z880 Allergy status to penicillin: Secondary | ICD-10-CM | POA: Diagnosis not present

## 2019-04-01 DIAGNOSIS — O26899 Other specified pregnancy related conditions, unspecified trimester: Secondary | ICD-10-CM

## 2019-04-01 DIAGNOSIS — Z3A01 Less than 8 weeks gestation of pregnancy: Secondary | ICD-10-CM | POA: Insufficient documentation

## 2019-04-01 LAB — CBC WITH DIFFERENTIAL/PLATELET
Abs Immature Granulocytes: 0.03 10*3/uL (ref 0.00–0.07)
Basophils Absolute: 0 10*3/uL (ref 0.0–0.1)
Basophils Relative: 0 %
Eosinophils Absolute: 0.2 10*3/uL (ref 0.0–0.5)
Eosinophils Relative: 3 %
HCT: 31.8 % — ABNORMAL LOW (ref 36.0–46.0)
Hemoglobin: 10.7 g/dL — ABNORMAL LOW (ref 12.0–15.0)
Immature Granulocytes: 0 %
Lymphocytes Relative: 28 %
Lymphs Abs: 2.6 10*3/uL (ref 0.7–4.0)
MCH: 27.6 pg (ref 26.0–34.0)
MCHC: 33.6 g/dL (ref 30.0–36.0)
MCV: 82.2 fL (ref 80.0–100.0)
Monocytes Absolute: 0.5 10*3/uL (ref 0.1–1.0)
Monocytes Relative: 6 %
Neutro Abs: 5.9 10*3/uL (ref 1.7–7.7)
Neutrophils Relative %: 63 %
Platelets: 325 10*3/uL (ref 150–400)
RBC: 3.87 MIL/uL (ref 3.87–5.11)
RDW: 13.2 % (ref 11.5–15.5)
WBC: 9.3 10*3/uL (ref 4.0–10.5)
nRBC: 0 % (ref 0.0–0.2)

## 2019-04-01 LAB — COMPREHENSIVE METABOLIC PANEL
ALT: 14 U/L (ref 0–44)
AST: 15 U/L (ref 15–41)
Albumin: 3.3 g/dL — ABNORMAL LOW (ref 3.5–5.0)
Alkaline Phosphatase: 39 U/L (ref 38–126)
Anion gap: 6 (ref 5–15)
BUN: 23 mg/dL — ABNORMAL HIGH (ref 6–20)
CO2: 19 mmol/L — ABNORMAL LOW (ref 22–32)
Calcium: 8.9 mg/dL (ref 8.9–10.3)
Chloride: 108 mmol/L (ref 98–111)
Creatinine, Ser: 1.08 mg/dL — ABNORMAL HIGH (ref 0.44–1.00)
GFR calc Af Amer: >60 mL/min (ref >60)
GFR calc non Af Amer: >60 mL/min (ref >60)
Glucose, Bld: 275 mg/dL — ABNORMAL HIGH (ref 70–99)
Potassium: 4.9 mmol/L (ref 3.5–5.1)
Sodium: 133 mmol/L — ABNORMAL LOW (ref 135–145)
Total Bilirubin: 0.2 mg/dL — ABNORMAL LOW (ref 0.3–1.2)
Total Protein: 7 g/dL (ref 6.5–8.1)

## 2019-04-01 LAB — URINALYSIS, ROUTINE W REFLEX MICROSCOPIC
Bilirubin Urine: NEGATIVE
Glucose, UA: 50 mg/dL — AB
Hgb urine dipstick: NEGATIVE
Ketones, ur: NEGATIVE mg/dL
Nitrite: NEGATIVE
Protein, ur: 100 mg/dL — AB
Specific Gravity, Urine: 1.015 (ref 1.005–1.030)
pH: 5 (ref 5.0–8.0)

## 2019-04-01 LAB — LIPASE, BLOOD: Lipase: 26 U/L (ref 11–51)

## 2019-04-01 MED ORDER — LOPERAMIDE HCL 2 MG PO TABS: 2.0000 mg | ORAL_TABLET | Freq: Four times a day (QID) | ORAL | 0 refills | Status: DC | PRN

## 2019-04-01 MED ORDER — ONDANSETRON 4 MG PO TBDP: 4.0000 mg | ORAL_TABLET | Freq: Three times a day (TID) | ORAL | 0 refills | Status: DC | PRN

## 2019-04-01 MED ORDER — CEPHALEXIN 500 MG PO CAPS: 500.0000 mg | ORAL_CAPSULE | Freq: Four times a day (QID) | ORAL | 0 refills | Status: DC

## 2019-04-01 NOTE — MAU Note (Signed)
Having diarrhea the last 3 days, watery stools (has gone 3 times today).  No one at home has been sick. Denies pain or vag bleeding. Denies vomiting

## 2019-04-01 NOTE — MAU Provider Note (Signed)
History     CSN: [id number]  Arrival date and time: 04/01/19 1654   First Provider Initiated Contact with Patient 04/01/19 1819      Chief Complaint  Patient presents with  . Diarrhea   HPI   Ashley Freeman is a 28 y.o. female G2P0010 @ 19w6dhere with diarrhea. Has no reason for the diarrhea.  No recent antibiotic use. Only new medications she has started are for GERD only. Says she had these same symptoms back in September and took medication to alleviate diarrhea and the diarrhea stopped. In the last 24 hours she has had 8 episodes of diarrhea. She lives with her mom who does not have any symptoms or illness. She denies abdominal pain or vaginal bleeding.   Her diet today included: Sausage for breakfast, pizza for lunch and chips about 30 minutes ago. Drinks water and sprite 0 during the day.   OB History    Gravida  2   Para  0   Term      Preterm      AB  1   Living  0     SAB  1   TAB      Ectopic      Multiple      Live Births              Past Medical History:  Diagnosis Date  . Diabetes mellitus 2001   Diagnosed at age 28; Type I  . Diarrhea 05/30/2016  . DKA (diabetic [place]etoacidoses) [place] 08/19/2013  . DKA (diabetic ketoacidoses) [place] 05/30/2016  . Gonorrhea 08/2011   Treated in 09/2011    Past Surgical History:  Procedure Laterality Date  . INCISION AND DRAINAGE PERIRECTAL ABSCESS Right 08/18/2013   Procedure: IRRIGATION AND DEBRIDEMENT GLUTEAL ABSCESS;  Surgeon: ARalene Ok MD;  [facility] [facility]  Service: General;  Laterality: Right;  . INCISION AND DRAINAGE PERIRECTAL ABSCESS Right 09/19/2013   Procedure: IRRIGATION AND DEBRIDEMENT RIGHT GLUTEAL AND LABIAL ABSCESSES;  Surgeon: ARalene Ok MD;  [facility]  Service: General;  Laterality: Right;  . INCISION AND DRAINAGE PERIRECTAL ABSCESS Right 09/24/2013   Procedure: IRRIGATION AND DEBRIDEMENT PERIRECTAL ABSCESS;  Surgeon: JGwenyth Ober MD;  [facility] [facility]  Service:  General;  Laterality: Right;    Family History  Problem Relation Age of Onset  . Asthma Mother   . Gout Father   . Diabetes Paternal Grandmother   . Anesthesia problems Neg Hx     Social History   Tobacco Use  . Smoking status: Never Smoker  . Smokeless tobacco: Never Used  Substance Use Topics  . Alcohol use: Not Currently  . Drug use: No    Allergies:  Allergies  Allergen Reactions  . Penicillins Hives and Rash    Has patient had a PCN reaction causing immediate rash, facial/tongue/throat swelling, SOB or lightheadedness with hypotension: Yes Has patient had a PCN reaction causing severe rash involving mucus membranes or skin necrosis: No Has patient had a PCN reaction that required hospitalization: Yes Has patient had a PCN reaction occurring within the last 10 years: No If all of the above answers are "NO", then may proceed with Cephalosporin use.  . Benadryl [Diphenhydramine] Itching  . Doxycycline Itching    Facility-Administered Medications Prior to Admission  Medication Dose Route Frequency Provider Last Rate Last Dose  . Insulin Pen Needle (NOVOFINE) 200 each  20 packet Subcutaneous PRN WAllie Bossier MD  Medications Prior to Admission  Medication Sig Dispense Refill Last Dose  . blood glucose meter kit and supplies KIT Dispense based on patient and insurance preference. Use up to four times daily as directed. (FOR ICD-9 250.00, 250.01). 1 each 0   . Continuous Blood Gluc Receiver (DEXCOM G6 RECEIVER) DEVI 1 Device by Does not apply route as directed. 1 Device 0   . Continuous Blood Gluc Sensor (DEXCOM G6 SENSOR) MISC 1 Device by Does not apply route as directed. 3 each 6   . Continuous Blood Gluc Transmit (DEXCOM G6 TRANSMITTER) MISC 1 Device by Does not apply route as directed. 1 each 3   . famotidine (PEPCID) 20 MG tablet Take 1 tablet (20 mg total) by mouth 2 (two) times daily. 30 tablet 2   . insulin aspart (NOVOLOG) cartridge Inject 6 Units into the  skin 3 (three) times daily with meals. 15 mL 11   . Insulin Glargine (LANTUS) 100 UNIT/ML Solostar Pen Inject 24 Units into the skin daily. (Patient taking differently: Inject 26 Units into the skin daily. ) 15 mL 0   . insulin lispro (HUMALOG) 100 UNIT/ML cartridge Inject 0.06 mLs (6 Units total) into the skin 3 (three) times daily with meals. (Patient taking differently: Inject 6 Units into the skin 3 (three) times daily with meals. As needed) 18 mL 11    Results for orders placed or performed during the hospital encounter of 04/01/19 (from the past 48 hour(s))  Urinalysis, Routine w reflex microscopic     Status: Abnormal   Collection Time: 04/01/19  5:30 PM  Result Value Ref Range   Color, Urine YELLOW YELLOW   APPearance HAZY (A) CLEAR   Specific Gravity, Urine 1.015 1.005 - 1.030   pH 5.0 5.0 - 8.0   Glucose, UA 50 (A) NEGATIVE mg/dL   Hgb urine dipstick NEGATIVE NEGATIVE   Bilirubin Urine NEGATIVE NEGATIVE   Ketones, ur NEGATIVE NEGATIVE mg/dL   Protein, ur 100 (A) NEGATIVE mg/dL   Nitrite NEGATIVE NEGATIVE   Leukocytes,Ua MODERATE (A) NEGATIVE   RBC / HPF 0-5 0 - 5 RBC/hpf   WBC, UA 6-10 0 - 5 WBC/hpf   Bacteria, UA MANY (A) NONE SEEN   Squamous Epithelial / LPF 0-5 0 - 5   Hyaline Casts, UA PRESENT     Comment: Performed at [facility], [address], [place], [place] [address]  Lipase, blood     Status: None   Collection Time: 04/01/19  6:42 PM  Result Value Ref Range   Lipase 26 11 - 51 U/L    Comment: Performed at [facility] [address] [place], [place] [address]  CBC with Differential/Platelet     Status: Abnormal   Collection Time: 04/01/19  6:42 PM  Result Value Ref Range   WBC 9.3 4.0 - 10.5 K/uL   RBC 3.87 3.87 - 5.11 MIL/uL   Hemoglobin 10.7 (L) 12.0 - 15.0 g/dL   HCT 31.8 (L) 36.0 - 46.0 %   MCV 82.2 80.0 - 100.0 fL   MCH 27.6 26.0 - 34.0 pg   MCHC 33.6 30.0 - 36.0 g/dL   RDW 13.2 11.5 - 15.5 %   Platelets 325 150 - 400 K/uL   nRBC 0.0  0.0 - 0.2 %   Neutrophils Relative % 63 %   Neutro Abs 5.9 1.7 - 7.7 K/uL   Lymphocytes Relative 28 %   Lymphs Abs 2.6 0.7 - 4.0 K/uL   Monocytes  Relative 6 %   Monocytes Absolute 0.5 0.1 - 1.0 K/uL   Eosinophils Relative 3 %   Eosinophils Absolute 0.2 0.0 - 0.5 K/uL   Basophils Relative 0 %   Basophils Absolute 0.0 0.0 - 0.1 K/uL   Immature Granulocytes 0 %   Abs Immature Granulocytes 0.03 0.00 - 0.07 K/uL    Comment: Performed at [facility], [place] [address] [place], [place] [address]  Comprehensive metabolic panel     Status: Abnormal   Collection Time: 04/01/19  6:42 PM  Result Value Ref Range   Sodium 133 (L) 135 - 145 mmol/L   Potassium 4.9 3.5 - 5.1 mmol/L   Chloride 108 98 - 111 mmol/L   CO2 19 (L) 22 - 32 mmol/L   Glucose, Bld 275 (H) 70 - 99 mg/dL   BUN 23 (H) 6 - 20 mg/dL   Creatinine, Ser 1.08 (H) 0.44 - 1.00 mg/dL   Calcium 8.9 8.9 - 10.3 mg/dL   Total Protein 7.0 6.5 - 8.1 g/dL   Albumin 3.3 (L) 3.5 - 5.0 g/dL   AST 15 15 - 41 U/L   ALT 14 0 - 44 U/L   Alkaline Phosphatase 39 38 - 126 U/L   Total Bilirubin 0.2 (L) 0.3 - 1.2 mg/dL   GFR calc non Af Amer >60 >60 mL/min   GFR calc Af Amer >60 >60 mL/min   Anion gap 6 5 - 15    Comment: Performed at [facility], [place] [address], [place], [place] [address]   Review of Systems  Constitutional: Negative for fever.  Gastrointestinal: Positive for abdominal pain (Some GERD, upper abdominal pain only) and nausea. Negative for vomiting.  Genitourinary: Positive for urgency. Negative for dysuria.   Physical Exam   Blood pressure 110/74, pulse (!) 117, temperature 98.3 F (36.8 C), temperature source Oral, resp. rate 16, height '5\' 3"'  (1.6 m), weight 59.2 kg, SpO2 100 %.  Physical Exam  Constitutional: She is oriented to person, place, and time. She appears well-developed and well-nourished. No distress.  HENT:  Head: Normocephalic.  GI: Soft. Bowel sounds are increased. There is no abdominal tenderness.  There is no rigidity, no rebound and no guarding.  Musculoskeletal: Normal range of motion.  Neurological: She is alert and oriented to person, place, and time.  Skin: Skin is warm. She is not diaphoretic.  Psychiatric: Her behavior is normal.   MAU Course  Procedures  None  MDM  Attempted to collect stool sample today in MAU. Patient is unable to give sample CBC & CMP with lipase.  No acute findings.   Assessment and Plan   A:  1. Diarrhea during pregnancy   2. Acute lower UTI   3. Nausea   4. [redacted] weeks gestation of pregnancy     P:  Discharge home in stable condition F/u with OB, may need GI consult if continues Rx: Very short course of Zofran, Imodium, Keflex Urine culture pending. If symptoms worsen, go to [facility] ED BRAT diet discussed.  No fried foods.   Noni Saupe I, NP 04/01/2019 8:30 PM

## 2019-04-01 NOTE — Discharge Instructions (Signed)
Bland Diet A bland diet consists of foods that are often soft and do not have a lot of fat, fiber, or extra seasonings. Foods without fat, fiber, or seasoning are easier for the body to digest. They are also less likely to irritate your mouth, throat, stomach, and other parts of your digestive system. A bland diet is sometimes called a BRAT diet. What is my plan? Your health care provider or food and nutrition specialist (dietitian) may recommend specific changes to your diet to prevent symptoms or to treat your symptoms. These changes may include:  Eating small meals often.  Cooking food until it is soft enough to chew easily.  Chewing your food well.  Drinking fluids slowly.  Not eating foods that are very spicy, sour, or fatty.  Not eating citrus fruits, such as oranges and grapefruit. What do I need to know about this diet?  Eat a variety of foods from the bland diet food list.  Do not follow a bland diet longer than needed.  Ask your health care provider whether you should take vitamins or supplements. What foods can I eat? Grains  Hot cereals, such as cream of wheat. Rice. Bread, crackers, or tortillas made from refined white flour. Vegetables Canned or cooked vegetables. Mashed or boiled potatoes. Fruits  Bananas. Applesauce. Other types of cooked or canned fruit with the skin and seeds removed, such as canned peaches or pears. Meats and other proteins  Scrambled eggs. Creamy peanut butter or other nut butters. Lean, well-cooked meats, such as chicken or fish. Tofu. Soups or broths. Dairy Low-fat dairy products, such as milk, cottage cheese, or yogurt. Beverages  Water. Herbal tea. Apple juice. Fats and oils Mild salad dressings. Canola or olive oil. Sweets and desserts Pudding. Custard. Fruit gelatin. Ice cream. The items listed above may not be a complete list of recommended foods and beverages. Contact a dietitian for more options. What foods are not  recommended? Grains Whole grain breads and cereals. Vegetables Raw vegetables. Fruits Raw fruits, especially citrus, berries, or dried fruits. Dairy Whole fat dairy foods. Beverages Caffeinated drinks. Alcohol. Seasonings and condiments Strongly flavored seasonings or condiments. Hot sauce. Salsa. Other foods Spicy foods. Fried foods. Sour foods, such as pickled or fermented foods. Foods with high sugar content. Foods high in fiber. The items listed above may not be a complete list of foods and beverages to avoid. Contact a dietitian for more information. Summary  A bland diet consists of foods that are often soft and do not have a lot of fat, fiber, or extra seasonings.  Foods without fat, fiber, or seasoning are easier for the body to digest.  Check with your health care provider to see how long you should follow this diet plan. It is not meant to be followed for long periods. This information is not intended to replace advice given to you by your health care provider. Make sure you discuss any questions you have with your health care provider. Document Released: 08/08/2015 Document Revised: 05/15/2017 Document Reviewed: 05/15/2017 Elsevier Patient Education  2020 [place] to Help Relieve Diarrhea, Adult When you have diarrhea, the foods you eat and your eating habits are very important. Choosing the right foods and drinks can help:  Relieve diarrhea.  Replace lost fluids and nutrients.  Prevent dehydration. What general guidelines should I follow?  Relieving diarrhea  Choose foods with less than 2 g or .07 oz. of fiber per serving.  Limit fats to less than 8 tsp (  38 g or 1.34 oz.) a day.  Avoid the following: ? Foods and beverages sweetened with high-fructose corn syrup, honey, or sugar alcohols such as xylitol, sorbitol, and mannitol. ? Foods that contain a lot of fat or sugar. ? Fried, greasy, or spicy foods. ? High-fiber grains, breads, and  cereals. ? Raw fruits and vegetables.  Eat foods that are rich in probiotics. These foods include dairy products such as yogurt and fermented milk products. They help increase healthy bacteria in the stomach and intestines (gastrointestinal tract, or GI tract).  If you have lactose intolerance, avoid dairy products. These may make your diarrhea worse.  Take medicine to help stop diarrhea (antidiarrheal medicine) only as told by your health care provider. Replacing nutrients  Eat small meals or snacks every 3-4 hours.  Eat bland foods, such as white rice, toast, or baked potato, until your diarrhea starts to get better. Gradually reintroduce nutrient-rich foods as tolerated or as told by your health care provider. This includes: ? Well-cooked protein foods. ? Peeled, seeded, and soft-cooked fruits and vegetables. ? Low-fat dairy products.  Take vitamin and mineral supplements as told by your health care provider. Preventing dehydration  Start by sipping water or a special solution to prevent dehydration (oral rehydration solution, ORS). Urine that is clear or pale yellow means that you are getting enough fluid.  Try to drink at least 8-10 cups of fluid each day to help replace lost fluids.  You may add other liquids in addition to water, such as clear juice or decaffeinated sports drinks, as tolerated or as told by your health care provider.  Avoid drinks with caffeine, such as coffee, tea, or soft drinks.  Avoid alcohol. What foods are recommended?     The items listed may not be a complete list. Talk with your health care provider about what dietary choices are best for you. Grains White rice. White, [place], or pita breads (fresh or toasted), including plain rolls, buns, or bagels. White pasta. Saltine, soda, or graham crackers. Pretzels. Low-fiber cereal. Cooked cereals made with water (such as cornmeal, farina, or cream cereals). Plain muffins. Matzo. Melba toast.  Zwieback. Vegetables Potatoes (without the skin). Most well-cooked and canned vegetables without skins or seeds. Tender lettuce. Fruits Apple sauce. Fruits canned in juice. Cooked apricots, cherries, grapefruit, peaches, pears, or plums. Fresh bananas and cantaloupe. Meats and other protein foods Baked or boiled chicken. Eggs. Tofu. Fish. Seafood. Smooth nut butters. Ground or well-cooked tender beef, ham, veal, lamb, pork, or poultry. Dairy Plain yogurt, kefir, and unsweetened liquid yogurt. Lactose-free milk, buttermilk, skim milk, or soy milk. Low-fat or nonfat hard cheese. Beverages Water. Low-calorie sports drinks. Fruit juices without pulp. Strained tomato and vegetable juices. Decaffeinated teas. Sugar-free beverages not sweetened with sugar alcohols. Oral rehydration solutions, if approved by your health care provider. Seasoning and other foods Bouillon, broth, or soups made from recommended foods. What foods are not recommended? The items listed may not be a complete list. Talk with your health care provider about what dietary choices are best for you. Grains Whole grain, whole wheat, bran, or rye breads, rolls, pastas, and crackers. Wild or brown rice. Whole grain or bran cereals. Barley. Oats and oatmeal. Corn tortillas or taco shells. Granola. Popcorn. Vegetables Raw vegetables. Fried vegetables. Cabbage, broccoli, Brussels sprouts, artichokes, baked beans, beet greens, corn, kale, legumes, peas, sweet potatoes, and yams. Potato skins. Cooked spinach and cabbage. Fruits Dried fruit, including raisins and dates. Raw fruits. Stewed or dried prunes. Canned  fruits with syrup. Meat and other protein foods Fried or fatty meats. Deli meats. Chunky nut butters. Nuts and seeds. Beans and lentils. Berniece Salines. Hot dogs. Sausage. Dairy High-fat cheeses. Whole milk, chocolate milk, and beverages made with milk, such as milk shakes. Half-and-half. Cream. sour cream. Ice  cream. Beverages Caffeinated beverages (such as coffee, tea, soda, or energy drinks). Alcoholic beverages. Fruit juices with pulp. Prune juice. Soft drinks sweetened with high-fructose corn syrup or sugar alcohols. High-calorie sports drinks. Fats and oils Butter. Cream sauces. Margarine. Salad oils. Plain salad dressings. Olives. Avocados. Mayonnaise. Sweets and desserts Sweet rolls, doughnuts, and sweet breads. Sugar-free desserts sweetened with sugar alcohols such as xylitol and sorbitol. Seasoning and other foods Honey. Hot sauce. Chili powder. Gravy. Cream-based or milk-based soups. Pancakes and waffles. Summary  When you have diarrhea, the foods you eat and your eating habits are very important.  Make sure you get at least 8-10 cups of fluid each day, or enough to keep your urine clear or pale yellow.  Eat bland foods and gradually reintroduce healthy, nutrient-rich foods as tolerated, or as told by your health care provider.  Avoid high-fiber, fried, greasy, or spicy foods. This information is not intended to replace advice given to you by your health care provider. Make sure you discuss any questions you have with your health care provider. Document Released: 07/07/2003 Document Revised: 08/07/2018 Document Reviewed: 04/13/2016 Elsevier Patient Education  2020 [employer].

## 2019-04-03 LAB — CULTURE, OB URINE
Culture: >=100000 — AB
Special Requests: NORMAL

## 2019-04-04 ENCOUNTER — Encounter: Payer: Self-pay | Admitting: Advanced Practice Midwife

## 2019-04-06 ENCOUNTER — Other Ambulatory Visit: Payer: Self-pay

## 2019-04-08 ENCOUNTER — Encounter: Payer: Self-pay | Admitting: Internal Medicine

## 2019-04-08 ENCOUNTER — Ambulatory Visit (INDEPENDENT_AMBULATORY_CARE_PROVIDER_SITE_OTHER): Payer: BLUE CROSS/BLUE SHIELD | Admitting: Internal Medicine

## 2019-04-08 ENCOUNTER — Other Ambulatory Visit: Payer: Self-pay

## 2019-04-08 VITALS — BP 114/72 | HR 111 | Temp 98.5°F | Ht 63.0 in | Wt 135.8 lb

## 2019-04-08 DIAGNOSIS — E1065 Type 1 diabetes mellitus with hyperglycemia: Secondary | ICD-10-CM

## 2019-04-08 DIAGNOSIS — Z3A01 Less than 8 weeks gestation of pregnancy: Secondary | ICD-10-CM

## 2019-04-08 LAB — MICROALBUMIN / CREATININE URINE RATIO
Creatinine,U: 65.9 mg/dL
Microalb Creat Ratio: 115.3 mg/g — ABNORMAL HIGH (ref 0.0–30.0)
Microalb, Ur: 76 mg/dL — ABNORMAL HIGH (ref 0.0–1.9)

## 2019-04-08 LAB — POCT GLYCOSYLATED HEMOGLOBIN (HGB A1C): Hemoglobin A1C: 8.3 % — AB (ref 4.0–5.6)

## 2019-04-08 MED ORDER — INSULIN GLARGINE 100 UNIT/ML SOLOSTAR PEN: 24.0000 [IU] | PEN_INJECTOR | Freq: Every day | SUBCUTANEOUS | 3 refills | Status: DC

## 2019-04-08 NOTE — Patient Instructions (Signed)
-   Decrease Lantus to 24 units daily  - Novolog 6 units with Breakfast and Lunch , 8 units with Supper - Check sugars before meals and 2 hours after meals   TIMING  BLOOD SUGAR GOALS  FASTING (before breakfast)  60 - 95  BEFORE MEALS LESS THAN 100  2 hours AFTER MEAL LESS THAN 120      HOW TO TREAT LOW BLOOD SUGARS (Blood sugar LESS THAN 60 MG/DL)  Please follow the RULE OF 15 for the treatment of hypoglycemia treatment (when your (blood sugars are less than 60 mg/dL)    STEP 1: Take 15 grams of carbohydrates when your blood sugar is low, which includes:   3-4 GLUCOSE TABS  OR  3-4 OZ OF JUICE OR REGULAR SODA OR  ONE TUBE OF GLUCOSE GEL     STEP 2: RECHECK blood sugar in 15 MINUTES STEP 3: If your blood sugar is still low at the 15 minute recheck --> then, go back to STEP 1 and treat AGAIN with another 15 grams of carbohydrates.

## 2019-04-08 NOTE — Progress Notes (Signed)
Name: Ashley Freeman  Age/ Sex: 28 y.o., female   MRN/ DOB: [id number], 07/05/1990     PCP: Charlott Rakes, MD   Reason for Endocrinology Evaluation: Type 2 Diabetes Mellitus  Initial Endocrine Consultative Visit: 06/27/18    PATIENT IDENTIFIER: Ashley Freeman is a 28 y.o. female with a past medical history of T1DM with multiple DKA's. The patient has followed with Endocrinology clinic since 06/27/2018 for consultative assistance with management of her diabetes.  DIABETIC HISTORY:  Ashley Freeman was diagnosed with T1DM at age 55. Hx of multiple DKA's due to non-compliance. Her hemoglobin A1c has ranged from  in 11.3% in 2011, peaking at 19.3% in 2015.   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 We gave her a standing dose of humalog and changed lantus  Dose.   Today (04/08/2019): Ashley Freeman is here for a follow up on diabetes management. She has missed multiple appointments since her last visit here. She is currently pregnant at [redacted] Weeks and 6 days. She  checks her blood sugars 3-4 times daily, through the dexcom . The patient has had occasional hypoglycemic episodes since the last clinic visit, which typically occur in the morning. The patient is symptomatic with these episodes.  [facility] eye exam 12/12/2018    ROS: As per HPI and as detailed below: Review of Systems  Constitutional: Negative for chills and fever.  HENT: Negative for congestion and sore throat.   Cardiovascular: Negative for chest pain and palpitations.  Gastrointestinal: Negative for diarrhea and nausea.  Neurological: Negative for tingling and tremors.      HOME DIABETES REGIMEN:   Lantus 24 units daily- taking 26 units   Novolog (Inpen) 6 units 3 times daily with meal- inpen 0 units for breakfast , 6 units with lunch and 8 units with supper      CONTINUOUS GLUCOSE MONITORING RECORD INTERPRETATION    Dates of Recording:  11/26-12/12/2018  Sensor description: Dexcom  Results statistics:   CGM use % of time 86  Average and SD 188/72  Time in range   48     %  % Time Above 180 33  % Time above 250 17  % Time Below target 1     Glycemic patterns summary: hyperglycemic most of the day   Hyperglycemic episodes  Mainly postprandial   Hypoglycemic episodes occurred mainly at night   Overnight periods: decrease           HISTORY:  Past Medical History:  Past Medical History:  Diagnosis Date  . Diabetes mellitus 2001   Diagnosed at age 73 ; Type I  . Diarrhea 05/30/2016  . DKA (diabetic ketoacidoses) ([place]) 08/19/2013  . DKA (diabetic ketoacidoses) ([place]) 05/30/2016  . Gonorrhea 08/2011   Treated in 09/2011   Past Surgical History:  Past Surgical History:  Procedure Laterality Date  . INCISION AND DRAINAGE PERIRECTAL ABSCESS Right 08/18/2013   Procedure: IRRIGATION AND DEBRIDEMENT GLUTEAL ABSCESS;  Surgeon: Ralene Ok, MD;  Location: [place];  Service: General;  Laterality: Right;  . INCISION AND DRAINAGE PERIRECTAL ABSCESS Right 09/19/2013   Procedure: IRRIGATION AND DEBRIDEMENT RIGHT GLUTEAL AND LABIAL ABSCESSES;  Surgeon: Ralene Ok, MD;  Location: [place];  Service: General;  Laterality: Right;  . INCISION AND DRAINAGE PERIRECTAL ABSCESS Right 09/24/2013   Procedure: IRRIGATION AND DEBRIDEMENT PERIRECTAL ABSCESS;  Surgeon: Gwenyth Ober, MD;  Location: [place];  Service: General;  Laterality: Right;    Social History:  reports that she has never smoked. She has never used smokeless tobacco. She reports previous alcohol use. She reports that she does not use drugs. Family History:  Family History  Problem Relation Age of Onset  . Asthma Mother   . Gout Father   . Diabetes Paternal Grandmother   . Anesthesia problems Neg Hx      HOME MEDICATIONS: Allergies as of 04/08/2019      Reactions   Penicillins Hives, Rash   Has patient had a PCN reaction causing immediate rash,  facial/tongue/throat swelling, SOB or lightheadedness with hypotension: Yes Has patient had a PCN reaction causing severe rash involving mucus membranes or skin necrosis: No Has patient had a PCN reaction that required hospitalization: Yes Has patient had a PCN reaction occurring within the last 10 years: No If all of the above answers are "NO", then may proceed with Cephalosporin use.   Benadryl [diphenhydramine] Itching   Doxycycline Itching      Medication List       Accurate as of April 08, 2019  1:09 PM. If you have any questions, ask your nurse or doctor.        blood glucose meter kit and supplies Kit Dispense based on patient and insurance preference. Use up to four times daily as directed. (FOR ICD-9 250.00, 250.01).   cephALEXin 500 MG capsule Commonly known as: Keflex Take 1 capsule (500 mg total) by mouth 4 (four) times daily.   Dexcom G6 Receiver Devi 1 Device by Does not apply route as directed.   Dexcom G6 Sensor Misc 1 Device by Does not apply route as directed.   Dexcom G6 Transmitter Misc 1 Device by Does not apply route as directed.   famotidine 20 MG tablet Commonly known as: PEPCID Take 1 tablet (20 mg total) by mouth 2 (two) times daily.   insulin aspart cartridge Commonly known as: NOVOLOG Inject 6 Units into the skin 3 (three) times daily with meals.   Insulin Glargine 100 UNIT/ML Solostar Pen Commonly known as: LANTUS Inject 24 Units into the skin daily. What changed: how much to take   insulin lispro 100 UNIT/ML cartridge Commonly known as: HUMALOG Inject 0.06 mLs (6 Units total) into the skin 3 (three) times daily with meals. What changed: additional instructions   loperamide 2 MG tablet Commonly known as: Imodium A-D Take 1 tablet (2 mg total) by mouth 4 (four) times daily as needed for diarrhea or loose stools.   ondansetron 4 MG disintegrating tablet Commonly known as: Zofran ODT Take 1 tablet (4 mg total) by mouth every 8  (eight) hours as needed for nausea or vomiting.        OBJECTIVE:   Vital Signs: BP 114/72 (BP Location: Right Arm, Patient Position: Sitting, Cuff Size: Normal)   Pulse (!) 111   Temp 98.5 F (36.9 C)   Ht '5\' 3"'  (1.6 m)   Wt 135 lb 12.8 oz (61.6 kg)   LMP  (LMP Unknown)   SpO2 99%   BMI 24.06 kg/m   Wt Readings from Last 3 Encounters:  04/08/19 135 lb 12.8 oz (61.6 kg)  04/01/19 130 lb 9.6 oz (59.2 kg)  12/10/18 135 lb 3.2 oz (61.3 kg)     Exam: General: Pt appears well and is in NAD  Neck: General: Supple without adenopathy. Thyroid: Thyroid size normal.  No goiter or nodules appreciated. No thyroid bruit.  Lungs: Clear with good BS bilat with  no rales, rhonchi, or wheezes  Heart: RRR with normal S1 and S2 and no gallops; no murmurs; no rub  Abdomen: Normoactive bowel sounds, soft, nontender, without masses or organomegaly palpable  Extremities: No pretibial edema.  Skin: Normal texture and temperature to palpation. No rash noted.  Neuro: MS is good with appropriate affect, pt is alert and Ox3   DM foot exam: 04/08/2019 The skin of the feet is intact without sores or ulcerations. The pedal pulses are 2+ on right and 2+ on left. The sensation is intact to a screening 5.07, 10 gram monofilament bilaterally     DATA REVIEWED:  Lab Results  Component Value Date   HGBA1C 8.3 (A) 04/08/2019   HGBA1C 16.8 (H) 10/24/2018   HGBA1C  06/04/2018     Comment:     >15    Lab Results  Component Value Date   MICRALBCREAT 101 (H) 06/04/2018   Results for FAYTH, TREFRY (MRN [id number]) as of 04/08/2019 09:44  Ref. Range 04/01/2019 18:42  Sodium Latest Ref Range: 135 - 145 mmol/L 133 (L)  Potassium Latest Ref Range: 3.5 - 5.1 mmol/L 4.9  Chloride Latest Ref Range: 98 - 111 mmol/L 108  CO2 Latest Ref Range: 22 - 32 mmol/L 19 (L)  Glucose Latest Ref Range: 70 - 99 mg/dL 275 (H)  BUN Latest Ref Range: 6 - 20 mg/dL 23 (H)  Creatinine Latest Ref Range: 0.44 - 1.00  mg/dL 1.08 (H)  Calcium Latest Ref Range: 8.9 - 10.3 mg/dL 8.9  Anion gap Latest Ref Range: 5 - 15  6  Alkaline Phosphatase Latest Ref Range: 38 - 126 U/L 39  Albumin Latest Ref Range: 3.5 - 5.0 g/dL 3.3 (L)  Lipase Latest Ref Range: 11 - 51 U/L 26  AST Latest Ref Range: 15 - 41 U/L 15  ALT Latest Ref Range: 0 - 44 U/L 14  Total Protein Latest Ref Range: 6.5 - 8.1 g/dL 7.0  Total Bilirubin Latest Ref Range: 0.3 - 1.2 mg/dL 0.2 (L)  GFR, Est Non African American Latest Ref Range: >60 mL/min >60  GFR, Est African American Latest Ref Range: >60 mL/min >60    Results for BURNADETTE, BASKETT (MRN [id number]) as of 04/08/2019 16:35  Ref. Range 04/08/2019 10:20  MICROALB/CREAT RATIO Latest Ref Range: 0.0 - 30.0 mg/g 115.3 (H)    ASSESSMENT / PLAN / RECOMMENDATIONS:   1) Type 1 Diabetes Mellitus, Poorly controlled, With microalbuminuria, During First trimester - Most recent A1c of 8.3%. Goal A1c < 6.5%.    - I have praised her for the dramatic improvement in her glucose control since her last visit but we also discussed A1c goal for pregnancy is 6-6.5%  - She is happy with the Dexcom and the inpen but somehow they are not linked together. She was advised to contact the Inpen representative to help her through this. She also has not been receiving any prandial insulin for breakfast (listed at 0 units on the inpen) and even though she has 6 units of novolog for lunch and 8 units for supper, she has not been getting any of it, she basically has only been receiving correction insulin . Pt instructed to enter not only the BG but also when she is getting ready to eat in to her inpen setting. She again was advised to contact the Inpen representative for further training  We also reviewed glucose pregnancy goals.  - She was advised to have an eye exam every trimester during pregnancy  MEDICATIONS:  Decrease Lantus to 24 units ONCE daily   Humalog 6 units with Breakfast and Lunch, 8 units with  Supper  CF: Novolog (BG-100/50)   EDUCATION / INSTRUCTIONS:  BG monitoring instructions: Patient is instructed to check her blood sugars 7 times a day, before meals, 2 hr post-prandial and bedtime.  Call [facility] clinic if: BG persistently < 70 or > 300. . I reviewed the Rule of 15 for the treatment of hypoglycemia in detail with the patient. Literature supplied.     F/U in 3 months    Signed electronically by: Mack Guise, MD  [facility] End[facility]  [address] [place], [place] [address] Phone: [phone] FAX: [phone]   CC: Charlott Rakes, [place] [address] Phone: [phone]  Fax: [phone]  Return to Endocrinology clinic as below: Future Appointments  Date Time Provider [place]  04/13/2019 10:00 AM [facility] [place] 1 [facility]  04/13/2019 10:40 AM [facility]  05/06/2019 11:10 AM , Melanie Crazier, MD [facility] None

## 2019-04-13 ENCOUNTER — Ambulatory Visit: Payer: BLUE CROSS/BLUE SHIELD

## 2019-04-13 ENCOUNTER — Ambulatory Visit (HOSPITAL_COMMUNITY): Admission: RE | Admit: 2019-04-13 | Payer: BLUE CROSS/BLUE SHIELD | Source: Ambulatory Visit

## 2019-04-21 ENCOUNTER — Ambulatory Visit (INDEPENDENT_AMBULATORY_CARE_PROVIDER_SITE_OTHER): Payer: BLUE CROSS/BLUE SHIELD | Admitting: General Practice

## 2019-04-21 ENCOUNTER — Ambulatory Visit (HOSPITAL_COMMUNITY)
Admission: RE | Admit: 2019-04-21 | Discharge: 2019-04-21 | Disposition: A | Discharge status: Home / Self Care | Payer: BLUE CROSS/BLUE SHIELD | Source: Ambulatory Visit

## 2019-04-21 ENCOUNTER — Other Ambulatory Visit: Payer: Self-pay

## 2019-04-21 DIAGNOSIS — O3680X0 Pregnancy with inconclusive fetal viability, not applicable or unspecified: Secondary | ICD-10-CM | POA: Insufficient documentation

## 2019-04-21 DIAGNOSIS — Z712 Person consulting for explanation of examination or test findings: Secondary | ICD-10-CM

## 2019-04-21 NOTE — Progress Notes (Signed)
Patient presents to office today for viability ultrasound results. Reviewed results with Ashley Freeman who finds single living [facility]UP- patient should begin prenatal care.   Informed patient of results & reviewed dating. Patient reports taking PNV. Encouraged her to begin OB care.   Koren Bound RN BSN 04/21/19

## 2019-04-21 NOTE — Progress Notes (Signed)
Patient seen and assessed by nursing staff during this encounter. I have reviewed the chart and agree with the documentation and plan.  Marcille Buffy DNP, CNM  04/21/19  4:50 PM

## 2019-04-29 ENCOUNTER — Other Ambulatory Visit: Payer: Self-pay

## 2019-04-29 ENCOUNTER — Telehealth: Payer: Self-pay | Admitting: General Practice

## 2019-04-29 ENCOUNTER — Ambulatory Visit (INDEPENDENT_AMBULATORY_CARE_PROVIDER_SITE_OTHER): Payer: BLUE CROSS/BLUE SHIELD | Admitting: General Practice

## 2019-04-29 ENCOUNTER — Encounter: Payer: BLUE CROSS/BLUE SHIELD | Attending: Obstetrics & Gynecology | Admitting: *Deleted

## 2019-04-29 ENCOUNTER — Ambulatory Visit: Payer: BLUE CROSS/BLUE SHIELD | Admitting: *Deleted

## 2019-04-29 DIAGNOSIS — O24019 Pre-existing diabetes mellitus, type 1, in pregnancy, unspecified trimester: Secondary | ICD-10-CM | POA: Insufficient documentation

## 2019-04-29 DIAGNOSIS — E109 Type 1 diabetes mellitus without complications: Secondary | ICD-10-CM | POA: Insufficient documentation

## 2019-04-29 DIAGNOSIS — O26891 Other specified pregnancy related conditions, first trimester: Secondary | ICD-10-CM

## 2019-04-29 DIAGNOSIS — Z3A09 9 weeks gestation of pregnancy: Secondary | ICD-10-CM

## 2019-04-29 DIAGNOSIS — O09891 Supervision of other high risk pregnancies, first trimester: Secondary | ICD-10-CM

## 2019-04-29 DIAGNOSIS — Z3A00 Weeks of gestation of pregnancy not specified: Secondary | ICD-10-CM | POA: Insufficient documentation

## 2019-04-29 DIAGNOSIS — M545 Low back pain: Secondary | ICD-10-CM

## 2019-04-29 DIAGNOSIS — Z713 Dietary counseling and surveillance: Secondary | ICD-10-CM | POA: Diagnosis not present

## 2019-04-29 DIAGNOSIS — Z87440 Personal history of urinary (tract) infections: Secondary | ICD-10-CM

## 2019-04-29 LAB — POCT URINALYSIS DIP (DEVICE)
Bilirubin Urine: NEGATIVE
Glucose, UA: NEGATIVE mg/dL
Ketones, ur: NEGATIVE mg/dL
Leukocytes,Ua: NEGATIVE
Nitrite: NEGATIVE
Protein, ur: >=300 mg/dL — AB
Specific Gravity, Urine: 1.025 (ref 1.005–1.030)
Urobilinogen, UA: 0.2 mg/dL (ref 0.0–1.0)
pH: 7 (ref 5.0–8.0)

## 2019-04-29 NOTE — Telephone Encounter (Signed)
Patient called and left message on nurse voicemail line reporting pain and requesting a call back.   Called patient and she reports lower back & hip pain. She wasn't sure if this was normal or not. Asked patient about difficulty urinating or difficulty with bowel movements. Patient denies dysuria but states she has to sit on the toilet for a while before anything comes out. She also reports incomplete emptying. Per chart review, patient was treated for GBS UTI 12/2. Offered appt today for UA at 1:30 to rule out continued UTI/bladder infection as potential source of pain. Patient verbalized understanding & had no questions.

## 2019-04-29 NOTE — Progress Notes (Signed)
Patient presents to office today reporting lower back/hip pain, difficulty urinating & incomplete emptying for several days. Per chart review, patient was treated for UTI on 12/2. UA is negative for infection today. Discussed with Dr Hulan Fray who recommends patient follow up with diabetes education since newly pregnant with type 1 diabetes. Added patient to diabetes education schedule for today.   Informed patient of UA results. Recommended extra strength Tylenol, walking, gentle stretches or heating pad on low for back/hip pain. Discussed meeting with diabetes education today & scheduling new OB appt upon leaving office. Patient verbalized understanding.  [employer] RN BSN 04/29/19

## 2019-04-29 NOTE — Progress Notes (Signed)
Patient was seen on 04/29/2019 for type 1 Diabetes and pregnancy self-management. EDD 11/26/2019. Patient states history of this diabetes for 19 years.  Diet history obtained. Patient eats good variety of all food groups and beverages include water and diet soda.  She states some training on Carbohydrate Counting in her past but with Dexcom and the InPen she doesn't feel she needs to use it as much. She expresses willingness to learn a different method of Carb Counting that may be simpler today. Patient is currently on Novolog InPen and Lantus insulins as her diabetes medications. She is using Dexcom CGM successfully so she is not using a meter. The following learning objectives were met by the patient :   States the definition of type 1 Diabetes and pregnancy   States why dietary management is important in controlling blood glucose  Describes the effects of carbohydrates on blood glucose levels  Demonstrates ability to create a balanced meal plan  Demonstrates carbohydrate counting   States when to check blood glucose levels  Demonstrates proper blood glucose monitoring techniques but she is using Dexcom CGM  States the effect of stress and exercise on blood glucose levels  States the importance of limiting caffeine and abstaining from alcohol and smoking  Plan:   Consider use of Carb Counting by [employer] to provide carbohydrate information to your InPen  Continue reading food labels for Total Carbohydrate of foods  Consider  increasing your activity level by walking or other activity daily as tolerated  Continue checking BG with Dexcom before breakfast and 2 hours after first bite of breakfast, lunch and dinner as directed by MD   Patient is being followed by Dr. Letta Kocher, a [place] Endocrinologist so she will be making insulin decisions based on Dexcom glucose results. I do not feel patient needs to enter BG into [employer].   Take medication as directed by MD. Patient  is taking Lantus in the AM and Novolog @ about 6 units per meal based on carbohydrate intake and blood sugar numbers. I introduced her to [employer] as an alternate way to deliver insulin with the basal rate being able to be customized to help with variable blood sugars at different times of day. She plans to discuss with Dr. Kelton Pillar to see if this might be a good option for her during her pregnancy as Medicaid covers the cost.   Patient is using Dexcom CGM so is not using a meter at this time.  Patient instructed to monitor glucose levels: FBS: 60 - 95 mg/dl 2 hour: <120 mg/dl  Patient received the following handouts:  Nutrition Diabetes and Pregnancy  Carbohydrate Counting List  Patient will be seen for follow-up as needed.

## 2019-04-30 ENCOUNTER — Other Ambulatory Visit: Payer: Self-pay | Admitting: *Deleted

## 2019-04-30 DIAGNOSIS — O24019 Pre-existing diabetes mellitus, type 1, in pregnancy, unspecified trimester: Secondary | ICD-10-CM

## 2019-05-04 ENCOUNTER — Emergency Department (HOSPITAL_COMMUNITY)
Admission: EM | Admit: 2019-05-04 | Discharge: 2019-05-04 | Disposition: A | Discharge status: Home / Self Care | Payer: BLUE CROSS/BLUE SHIELD | Attending: Emergency Medicine | Admitting: Emergency Medicine

## 2019-05-04 ENCOUNTER — Encounter (HOSPITAL_COMMUNITY): Payer: Self-pay | Admitting: Emergency Medicine

## 2019-05-04 DIAGNOSIS — Z3A10 10 weeks gestation of pregnancy: Secondary | ICD-10-CM | POA: Insufficient documentation

## 2019-05-04 DIAGNOSIS — E11649 Type 2 diabetes mellitus with hypoglycemia without coma: Secondary | ICD-10-CM

## 2019-05-04 DIAGNOSIS — Z794 Long term (current) use of insulin: Secondary | ICD-10-CM | POA: Diagnosis not present

## 2019-05-04 DIAGNOSIS — O219 Vomiting of pregnancy, unspecified: Secondary | ICD-10-CM | POA: Diagnosis not present

## 2019-05-04 DIAGNOSIS — Z79899 Other long term (current) drug therapy: Secondary | ICD-10-CM | POA: Insufficient documentation

## 2019-05-04 DIAGNOSIS — O26891 Other specified pregnancy related conditions, first trimester: Secondary | ICD-10-CM | POA: Diagnosis present

## 2019-05-04 DIAGNOSIS — E10649 Type 1 diabetes mellitus with hypoglycemia without coma: Secondary | ICD-10-CM | POA: Diagnosis not present

## 2019-05-04 DIAGNOSIS — R569 Unspecified convulsions: Secondary | ICD-10-CM | POA: Insufficient documentation

## 2019-05-04 DIAGNOSIS — O2311 Infections of bladder in pregnancy, first trimester: Secondary | ICD-10-CM | POA: Insufficient documentation

## 2019-05-04 DIAGNOSIS — N390 Urinary tract infection, site not specified: Secondary | ICD-10-CM

## 2019-05-04 LAB — COMPREHENSIVE METABOLIC PANEL
ALT: 14 U/L (ref 0–44)
AST: 23 U/L (ref 15–41)
Albumin: 2.5 g/dL — ABNORMAL LOW (ref 3.5–5.0)
Alkaline Phosphatase: 28 U/L — ABNORMAL LOW (ref 38–126)
Anion gap: 8 (ref 5–15)
BUN: 14 mg/dL (ref 6–20)
CO2: 20 mmol/L — ABNORMAL LOW (ref 22–32)
Calcium: 8.8 mg/dL — ABNORMAL LOW (ref 8.9–10.3)
Chloride: 107 mmol/L (ref 98–111)
Creatinine, Ser: 1 mg/dL (ref 0.44–1.00)
GFR calc Af Amer: >60 mL/min (ref >60)
GFR calc non Af Amer: >60 mL/min (ref >60)
Glucose, Bld: 551 mg/dL (ref 70–99)
Potassium: 3.6 mmol/L (ref 3.5–5.1)
Sodium: 135 mmol/L (ref 135–145)
Total Bilirubin: 0.3 mg/dL (ref 0.3–1.2)
Total Protein: 5.8 g/dL — ABNORMAL LOW (ref 6.5–8.1)

## 2019-05-04 LAB — CBC
HCT: 30.9 % — ABNORMAL LOW (ref 36.0–46.0)
Hemoglobin: 9.9 g/dL — ABNORMAL LOW (ref 12.0–15.0)
MCH: 27.4 pg (ref 26.0–34.0)
MCHC: 32 g/dL (ref 30.0–36.0)
MCV: 85.6 fL (ref 80.0–100.0)
Platelets: 281 10*3/uL (ref 150–400)
RBC: 3.61 MIL/uL — ABNORMAL LOW (ref 3.87–5.11)
RDW: 13.6 % (ref 11.5–15.5)
WBC: 12.1 10*3/uL — ABNORMAL HIGH (ref 4.0–10.5)
nRBC: 0 % (ref 0.0–0.2)

## 2019-05-04 LAB — URINALYSIS, ROUTINE W REFLEX MICROSCOPIC
Bilirubin Urine: NEGATIVE
Glucose, UA: >=500 mg/dL — AB
Hgb urine dipstick: NEGATIVE
Ketones, ur: NEGATIVE mg/dL
Nitrite: NEGATIVE
Protein, ur: >=300 mg/dL — AB
Specific Gravity, Urine: 1.016 (ref 1.005–1.030)
pH: 7 (ref 5.0–8.0)

## 2019-05-04 LAB — CBG MONITORING, ED
Glucose-Capillary: 191 mg/dL — ABNORMAL HIGH (ref 70–99)
Glucose-Capillary: 55 mg/dL — ABNORMAL LOW (ref 70–99)
Glucose-Capillary: 104 mg/dL — ABNORMAL HIGH (ref 70–99)
Glucose-Capillary: 41 mg/dL — CL (ref 70–99)
Glucose-Capillary: 151 mg/dL — ABNORMAL HIGH (ref 70–99)
Glucose-Capillary: 71 mg/dL (ref 70–99)
Glucose-Capillary: 228 mg/dL — ABNORMAL HIGH (ref 70–99)
Glucose-Capillary: 267 mg/dL — ABNORMAL HIGH (ref 70–99)
Glucose-Capillary: 384 mg/dL — ABNORMAL HIGH (ref 70–99)

## 2019-05-04 MED ORDER — DEXTROSE 50 % IV SOLN
25.0000 g | Freq: Once | INTRAVENOUS | Status: AC
  Administered 2019-05-04: 25 g via INTRAVENOUS

## 2019-05-04 MED ORDER — DOXYLAMINE SUCCINATE (SLEEP) 25 MG PO TABS
25.0000 mg | ORAL_TABLET | Freq: Once | ORAL | Status: AC
  Administered 2019-05-04: 25 mg via ORAL
  Filled 2019-05-04: qty 1

## 2019-05-04 MED ORDER — DEXTROSE 50 % IV SOLN
INTRAVENOUS | Status: AC
  Filled 2019-05-04: qty 50

## 2019-05-04 MED ORDER — DEXTROSE 50 % IV SOLN: 25.0000 g | Freq: Once | INTRAVENOUS | Status: DC

## 2019-05-04 MED ORDER — METOCLOPRAMIDE HCL 5 MG/ML IJ SOLN
10.0000 mg | Freq: Once | INTRAMUSCULAR | Status: AC
  Administered 2019-05-04: 10 mg via INTRAVENOUS
  Filled 2019-05-04: qty 2

## 2019-05-04 MED ORDER — CEPHALEXIN 250 MG PO CAPS
500.0000 mg | ORAL_CAPSULE | Freq: Once | ORAL | Status: AC
  Administered 2019-05-04: 500 mg via ORAL
  Filled 2019-05-04: qty 2

## 2019-05-04 MED ORDER — ACETAMINOPHEN 325 MG PO TABS
650.0000 mg | ORAL_TABLET | Freq: Once | ORAL | Status: AC
  Administered 2019-05-04: 650 mg via ORAL
  Filled 2019-05-04: qty 2

## 2019-05-04 MED ORDER — VITAMIN B-6 25 MG PO TABS
25.0000 mg | ORAL_TABLET | Freq: Once | ORAL | Status: AC
  Administered 2019-05-04: 25 mg via ORAL
  Filled 2019-05-04: qty 1

## 2019-05-04 MED ORDER — DEXTROSE-NACL 5-0.9 % IV SOLN: INTRAVENOUS | Status: DC

## 2019-05-04 MED ORDER — CEPHALEXIN 500 MG PO CAPS: 500.0000 mg | ORAL_CAPSULE | Freq: Four times a day (QID) | ORAL | 0 refills | Status: DC

## 2019-05-04 NOTE — ED Notes (Signed)
MD Ashok Cordia made aware pt is vomiting.

## 2019-05-04 NOTE — ED Notes (Signed)
Pt aware she needs to eat more, sprite sandwich and more graham crackers provided.

## 2019-05-04 NOTE — ED Provider Notes (Signed)
Patient care assumed at 1500.  Pt w/ T1DM, approx [redacted] weeks pregnant here for evaluation of seizure secondary to hypoglycemia.  She has had poor oral intake recently due to nausea and poor appetite.  She is awake and alert at bedside, eating graham crackers.  She does complain of headache - will treat with tylenol.  Plan to obs for recurrent hypoglycemia.   1540 patient with recurrent episode of hypoglycemia to 41 after eating graham crackers and drinking juice. Blood sugar did improve to 71 prior to administration of additional medications. She reports that she takes NovoLog based off of a sliding scale. She took seven units of her NovoLog this morning and ate waffles. She denies any vomiting at home. She is unable to state what her usual NovoLog dose is. Denies any fevers, abdominal pain, dysuria.  Will continue to monitor.    On repeat assessment patient is feeling improved. D5 drip was stopped and patient maintained normal to elevated glucose levels. Urinalysis equivocal for UTI in setting of multiple leukocytes. Will treat empirically given her symptoms. Will send a culture. Discussed with patient importance of follow-up with her endocrinologist as well as OB/GYN.   Quintella Reichert, MD 05/05/19 Shelah Lewandowsky

## 2019-05-04 NOTE — ED Triage Notes (Signed)
Pt BIB [facility], is [redacted] weeks pregnant (receiving prenatal care, 1st baby), friend reports pt ate breakfast then took a nap on the couch, pt woke up when she heard her glucometer alarming but then had a witnessed seizure on the couch (no trauma), when EMS arrived pt was post-ictal but then had a grand mal seizure and bit tongue (no other trauma). EMS reports glucometer dropped from 150 to 21 right before 2nd seizure.  Pt given D10 and glucagon which brought CBG up to 92. Pt currently A&O x2.  Mother reports pt has no hx of a seizure from hypoglycemia.

## 2019-05-04 NOTE — ED Provider Notes (Addendum)
[facility] Provider Note   CSN: [id number] Arrival date & time: 05/04/19  1320     History Chief Complaint  Patient presents with  . Seizures  . Hypoglycemia    Ashley Freeman is a 29 y.o. female.  Patient with hx diabetes, presents with seizure at home via EMS. Patient was lying on couch, her glucose monitor alarmed that it was low, and patient was noted to have generalized seizure lasting 1-2 minutes. EMS noted that patient was postictal. No hx seizures. Patient postictal - level 5 caveat. Unclear when patient last ate or drank. No report of nvd or fevers. No reported trauma or fall, as pt on couch, lying down during episode. EMS did give d10 and reports glucose improved.   The history is provided by the patient and the EMS personnel. The history is limited by the condition of the patient.  Seizures Hypoglycemia Associated symptoms: seizures        Past Medical History:  Diagnosis Date  . Diabetes mellitus 2001   Diagnosed at age 40 ; Type I  . Diarrhea 05/30/2016  . DKA (diabetic ketoacidoses) ([place]) 08/19/2013  . DKA (diabetic ketoacidoses) ([place]) 05/30/2016  . Gonorrhea 08/2011   Treated in 09/2011    Patient Active Problem List   Diagnosis Date Noted  . Type 1 diabetes mellitus with hyperglycemia ([place]) 04/08/2019  . Acute lower UTI   . DKA (diabetic ketoacidosis) ([place]) 12/29/2016  . History of trichomoniasis 05/31/2016  . Diarrhea 05/31/2016  . DKA (diabetic ketoacidoses) ([place]) 05/30/2016  . Hyperlipidemia 03/28/2016  . Diabetic neuropathy ([place]) 01/16/2016  . DKA, type 1 ([place]) 11/24/2015  . BV (bacterial vaginosis) 11/24/2015  . Chest pain 11/24/2015  . AKI (acute kidney injury) ([place]) 07/26/2014  . GBS (group b Streptococcus) UTI complicating pregnancy, first trimester 04/08/2014  . Diabetic ketoacidosis without coma associated with type 1 diabetes mellitus ([place]) 09/19/2013  . Bartholin's gland abscess 09/19/2013  .  Sepsis ([place]) 09/19/2013  . Abscess, gluteal, right 08/24/2013  . Health care maintenance 08/24/2013  . Hypophosphatemia 08/18/2013  . Hypomagnesemia 08/18/2013  . Leukocytosis, unspecified 08/17/2013  . Fever, unspecified 08/17/2013  . H/O medication noncompliance 08/12/2013  . Hyponatremia 08/04/2012  . Hypokalemia 05/07/2012  . Anemia 02/19/2012  . Hyperglycemia 11/06/2011  . Tachycardia 10/17/2011  . Diabetes mellitus type 1, uncontrolled, with complications ([place]) 57/26/2035    Past Surgical History:  Procedure Laterality Date  . INCISION AND DRAINAGE PERIRECTAL ABSCESS Right 08/18/2013   Procedure: IRRIGATION AND DEBRIDEMENT GLUTEAL ABSCESS;  Surgeon: Ralene Ok, MD;  Location: [facility];  Service: General;  Laterality: Right;  . INCISION AND DRAINAGE PERIRECTAL ABSCESS Right 09/19/2013   Procedure: IRRIGATION AND DEBRIDEMENT RIGHT GLUTEAL AND LABIAL ABSCESSES;  Surgeon: Ralene Ok, MD;  Location: [facility];  Service: General;  Laterality: Right;  . INCISION AND DRAINAGE PERIRECTAL ABSCESS Right 09/24/2013   Procedure: IRRIGATION AND DEBRIDEMENT PERIRECTAL ABSCESS;  Surgeon: Gwenyth Ober, MD;  Location: [facility];  Service: General;  Laterality: Right;     OB History    Gravida  2   Para  0   Term      Preterm      AB  1   Living  0     SAB  1   TAB      Ectopic      Multiple      Live Births              Family  History  Problem Relation Age of Onset  . Asthma Mother   . Gout Father   . Diabetes Paternal Grandmother   . Anesthesia problems Neg Hx     Social History   Tobacco Use  . Smoking status: Never Smoker  . Smokeless tobacco: Never Used  Substance Use Topics  . Alcohol use: Not Currently  . Drug use: No    Home Medications Prior to Admission medications   Medication Sig Start Date End Date Taking? Authorizing Provider  blood glucose meter kit and supplies KIT Dispense based on patient and insurance preference. Use up to four times  daily as directed. (FOR ICD-9 250.00, 250.01). 05/25/17   Isla Pence, MD  cephALEXin (KEFLEX) 500 MG capsule Take 1 capsule (500 mg total) by mouth 4 (four) times daily. 04/01/19   Rasch, Artist Pais, NP  Continuous Blood Gluc Receiver (DEXCOM G6 RECEIVER) DEVI 1 Device by Does not apply route as directed. 11/27/18   Shamleffer, Melanie Crazier, MD  Continuous Blood Gluc Sensor (DEXCOM G6 SENSOR) MISC 1 Device by Does not apply route as directed. 11/27/18   Shamleffer, Melanie Crazier, MD  Continuous Blood Gluc Transmit (DEXCOM G6 TRANSMITTER) MISC 1 Device by Does not apply route as directed. 11/27/18   Shamleffer, Melanie Crazier, MD  famotidine (PEPCID) 20 MG tablet Take 1 tablet (20 mg total) by mouth 2 (two) times daily. 03/26/19   Leftwich-Kirby, Kathie Dike, CNM  insulin aspart (NOVOLOG) cartridge Inject 6 Units into the skin 3 (three) times daily with meals. 11/28/18   Shamleffer, Melanie Crazier, MD  Insulin Glargine (LANTUS) 100 UNIT/ML Solostar Pen Inject 24 Units into the skin daily. 04/08/19   Shamleffer, Melanie Crazier, MD  insulin lispro (HUMALOG) 100 UNIT/ML cartridge Inject 0.06 mLs (6 Units total) into the skin 3 (three) times daily with meals. Patient taking differently: Inject 6 Units into the skin 3 (three) times daily with meals. As needed 11/27/18   Shamleffer, Melanie Crazier, MD  loperamide (IMODIUM A-D) 2 MG tablet Take 1 tablet (2 mg total) by mouth 4 (four) times daily as needed for diarrhea or loose stools. 04/01/19   Rasch, Anderson Malta I, NP  ondansetron (ZOFRAN-ODT) 4 MG disintegrating tablet Take 1 tablet (4 mg total) by mouth every 8 (eight) hours as needed for nausea or vomiting. 04/01/19   Rasch, Anderson Malta I, NP    Allergies    Penicillins, Benadryl [diphenhydramine], and Doxycycline  Review of Systems   Review of Systems  Unable to perform ROS: Mental status change  Constitutional: Negative for fever.  Neurological: Positive for seizures.  level 5 caveat, seizure, post  ictal.     Physical Exam Updated Vital Signs LMP  (LMP Unknown)   Physical Exam Vitals and nursing note reviewed.  Constitutional:      Appearance: Normal appearance. She is well-developed.  HENT:     Head: Atraumatic.     Nose: Nose normal.     Mouth/Throat:     Mouth: Mucous membranes are moist.  Eyes:     General: No scleral icterus.    Conjunctiva/sclera: Conjunctivae normal.     Pupils: Pupils are equal, round, and reactive to light.  Neck:     Trachea: No tracheal deviation.     Comments: No stiffness or rigidity. Thyroid not grossly enlarged or tender. Trachea midline.  Cardiovascular:     Rate and Rhythm: Normal rate and regular rhythm.     Pulses: Normal pulses.     Heart sounds: Normal heart sounds. No  murmur. No friction rub. No gallop.   Pulmonary:     Effort: Pulmonary effort is normal. No respiratory distress.     Breath sounds: Normal breath sounds.  Abdominal:     General: Bowel sounds are normal. There is no distension.     Palpations: Abdomen is soft.     Tenderness: There is no abdominal tenderness. There is no guarding.  Genitourinary:    Comments: No cva tenderness.  Musculoskeletal:        General: No swelling or tenderness.     Cervical back: Normal range of motion and neck supple. No rigidity. No muscular tenderness.     Comments: CTLS spine, non tender, aligned, no step off. Good rom bil extremities without pain or focal bony tenderness.   Skin:    General: Skin is warm and dry.     Findings: No rash.  Neurological:     Mental Status: She is alert.     Comments: Patient awake, lying on side. Altered/confused c/w post ictal state.   Psychiatric:     Comments: Post ictal/confused.      ED Results / Procedures / Treatments   Labs (all labs ordered are listed, but only abnormal results are displayed) Results for orders placed or performed during the hospital encounter of 05/04/19  CBC  Result Value Ref Range   WBC 12.1 (H) 4.0 - 10.5  K/uL   RBC 3.61 (L) 3.87 - 5.11 MIL/uL   Hemoglobin 9.9 (L) 12.0 - 15.0 g/dL   HCT 30.9 (L) 36.0 - 46.0 %   MCV 85.6 80.0 - 100.0 fL   MCH 27.4 26.0 - 34.0 pg   MCHC 32.0 30.0 - 36.0 g/dL   RDW 13.6 11.5 - 15.5 %   Platelets 281 150 - 400 K/uL   nRBC 0.0 0.0 - 0.2 %  POC CBG, ED  Result Value Ref Range   Glucose-Capillary 55 (L) 70 - 99 mg/dL  CBG monitoring, ED  Result Value Ref Range   Glucose-Capillary 191 (H) 70 - 99 mg/dL  CBG monitoring, ED  Result Value Ref Range   Glucose-Capillary 104 (H) 70 - 99 mg/dL   US OB Transvaginal  Result Date: 04/21/2019 CLINICAL DATA:  Pregnant, fetal viability assessment EXAM: TRANSVAGINAL OB ULTRASOUND TECHNIQUE: Transvaginal ultrasound was performed for complete evaluation of the gestation as well as the maternal uterus, adnexal regions, and pelvic cul-de-sac. COMPARISON:  03/26/2019 FINDINGS: Intrauterine gestational sac: Present, single Yolk sac:  Present Embryo:  Present Cardiac Activity: Present Heart Rate: 169 bpm CRL:   19.3 mm   8 w 3 d                  [place] EDC: 11/28/2019 Subchorionic hemorrhage: Small subchorionic hemorrhage visualized. Maternal uterus/adnexae: RIGHT ovary normal size and morphology 4.2 x 2.2 x 1.8 cm. LEFT ovary normal size and morphology 3.0 x 1.8 x 1.9 cm. Trace free pelvic fluid. No adnexal masses. IMPRESSION: Single live intrauterine gestation at 8 weeks 3 days EGA by crown-rump length. Small subchronic hemorrhage. Electronically Signed   By: Lavonia Dana M.D.   On: 04/21/2019 15:48    EKG None  Radiology No results found.  Procedures Procedures (including critical care time)  Medications Ordered in ED Medications  dextrose 50 % solution (has no administration in time range)  dextrose 50 % solution 25 g (has no administration in time range)    ED Course  I have reviewed the triage vital signs and the nursing notes.  Pertinent labs & imaging results that were available during my care of the patient were  reviewed by me and considered in my medical decision making (see chart for details).    MDM Rules/Calculators/A&P                      CBG.   Glucose 55, low. D50 iv. Po fluids/food.   Reviewed nursing notes and prior charts for additional history.   Continuous pulse ox and monitor. Seizure precautions. Labs sent.   Recheck glucose improved. Patient still not eating/drinking. Patient is full awake, alert, oriented. Notes problems with intermittent nausea/vomiting in early pregnancy, and very little po intake today. No bloody or bilious emesis. No abd or pelvic pain. No vaginal discharge or bleeding. Denies dysuria or gu c/o. Patient denies pain. No headache. No cough or uri symptoms. No sob. Chest cta bil. abd soft nt on recheck.   Food provided patient. Prior to eating, reports n/v. abd is soft nt. As actively vomiting, reglan iv. Iv d5 ns bolus. Doxylamine/pyridoxine po when able.   Repeat cbg in 53s. D50. d5 ns gtt/bolus at 250 cc/hr. Encourage po fluids/food.   CRITICAL CARE RE: IDDM with recurrent hypoglycemia, hypoglycemic seizure, pregnant.  Performed by: Mirna Mires Total critical care time: 40 minutes Critical care time was exclusive of separately billable procedures and treating other patients. Critical care was necessary to treat or prevent imminent or life-threatening deterioration. Critical care was time spent personally by me on the following activities: development of treatment plan with patient and/or surrogate as well as nursing, discussions with consultants, evaluation of patient's response to treatment, examination of patient, obtaining history from patient or surrogate, ordering and performing treatments and interventions, ordering and review of laboratory studies, ordering and review of radiographic studies, pulse oximetry and re-evaluation of patient's condition.   1528: signed out to Dr Ralene Bathe to check labs, chem, ua when back, recheck patient, monitor cbgs, and  dispo appropropriately.      Final Clinical Impression(s) / ED Diagnoses Final diagnoses:  None    Rx / DC Orders ED Discharge Orders    None           Lajean Saver, MD 05/18/19 1338

## 2019-05-04 NOTE — ED Notes (Signed)
Mom Kristeen Miss: [phone]

## 2019-05-04 NOTE — ED Notes (Addendum)
MD Ralene Bathe made aware of hypoglycemic episodes and how frequent pt "drops". Pt arousable, MD Ralene Bathe advised this RN to hold off on giving D 50 amp since CBG is now up to 71, will continue to monitor and recheck CBG's. Pt given more crackers, sprite and OJ.

## 2019-05-04 NOTE — ED Notes (Signed)
This RN updated mother on phone, pt spoke to her on speaker phone.

## 2019-05-04 NOTE — ED Notes (Signed)
Patient verbalizes understanding of discharge instructions. Opportunity for questioning and answers were provided. Armband removed by staff, pt discharged from ED.  

## 2019-05-04 NOTE — ED Notes (Signed)
Pt given OJ 

## 2019-05-05 ENCOUNTER — Other Ambulatory Visit: Payer: Self-pay

## 2019-05-05 LAB — URINE CULTURE: Culture: <10000 — AB

## 2019-05-06 ENCOUNTER — Ambulatory Visit: Payer: BLUE CROSS/BLUE SHIELD | Admitting: Internal Medicine

## 2019-05-06 ENCOUNTER — Other Ambulatory Visit: Payer: Self-pay

## 2019-05-06 ENCOUNTER — Ambulatory Visit (INDEPENDENT_AMBULATORY_CARE_PROVIDER_SITE_OTHER): Payer: BLUE CROSS/BLUE SHIELD | Admitting: *Deleted

## 2019-05-06 DIAGNOSIS — E1065 Type 1 diabetes mellitus with hyperglycemia: Secondary | ICD-10-CM | POA: Insufficient documentation

## 2019-05-06 DIAGNOSIS — O24019 Pre-existing diabetes mellitus, type 1, in pregnancy, unspecified trimester: Secondary | ICD-10-CM

## 2019-05-06 DIAGNOSIS — O24013 Pre-existing diabetes mellitus, type 1, in pregnancy, third trimester: Secondary | ICD-10-CM | POA: Insufficient documentation

## 2019-05-06 DIAGNOSIS — O0990 Supervision of high risk pregnancy, unspecified, unspecified trimester: Secondary | ICD-10-CM

## 2019-05-06 MED ORDER — BLOOD PRESSURE KIT DEVI: 1.0000 | 0 refills | Status: DC | PRN

## 2019-05-06 NOTE — Progress Notes (Signed)
I connected with  Marry Springfield-Baldwin on 05/06/19 at  3:30 PM EST by telephone and verified that I am speaking with the correct person using two identifiers.   I discussed the limitations, risks, security and privacy concerns of performing an evaluation and management service by telephone and the availability of in person appointments. I also discussed with the patient that there may be a patient responsible charge related to this service. The patient expressed understanding and agreed to proceed. Explained I am completing her New OB Intake today. We discussed Her EDD and that it is based on  Early [place] . I reviewed her allergies, meds, OB History, Medical /Surgical history, and appropriate screenings. I explained I will send her the [employer] app- app sent to her while on phone.  I explained we will send a blood pressure cuff to [facility] that will fill that prescription and they  will call her to verify her information. She and I discussed she would prefer the blood pressure cuff be delivered to our office and then we will give it to  Her at her  first ob appointment - and we will  show her how to use it. Explained  then we will have her take her blood pressure weekly and enter into the app. Explained she will have some visits in office and some virtually. She already has Community education officer. I reviewed her new ob  appointment date/ time with her , our location and to wear mask, no visitors.  I explained she will have a pelvic exam, ob bloodwork,  genetic testing if desired,- she does want a panorama. I scheduled an [place] at 19 weeks and gave her the appointment. She voices understanding.  I did call [facility] and they will deliver the blood pressure cuff to our office .  Jamilett Ferrante,RN 05/06/2019  3:29 PM

## 2019-05-06 NOTE — Patient Instructions (Signed)
  At [facility], we work as an integrated team, providing care to address both physical and emotional health. Your medical provider may refer you to see our Behavioral Health Clinician (BHC) on the same day you see your medical provider, as availability permits.  Our BHC is available to all patients, visits generally last between 20-30 minutes, but can be longer or shorter, depending on patient need. The BHC offers help with stress management, coping with symptoms of depression and anxiety, major life changes , sleep issues, changing risky behavior, grief and loss, life stress, working on personal life goals, and  behavioral health issues, as these all affect your overall health and wellness.  The BHC is NOT available for the following: court-ordered evaluations, specialty assessments (custody or disability), letters to employers, or obtaining certification for an emotional support animal. The BHC does not provide long-term therapeutic services. You have the right to refuse integrated behavioral health services, or to reschedule to see the BHC at a later date.  Exception: If you are having thoughts of suicide, we require that you either see the BHC for further assessment, or contract for safety with your medical provider prior to checking out.  Confidentiality exception: If it is suspected that a child or disabled adult is being abused or neglected, we are required by law to report that to either Child Protective Services or Adult Protective Services.  If you have a diagnosis of Bipolar affective disorder, Schizophrenia, or recurrent Major depressive disorder, we will recommend that you establish care with a psychiatrist, as these are lifelong, chronic conditions, and we want your overall emotional health and medications to be more closely monitored. If you anticipate needing extended maternity leave due to mental illness, it it recommended that you find a psychiatrist as soon as possible.  Neither the medical provider, nor the BHC, can recommend an extended maternity leave for mental health issues. Your medical provider or BHC may refer you to a therapist for ongoing, traditional therapy, or to a psychiatrist, for medication management, if it would benefit your overall health. Depending on your insurance, you may have a copay to see the BHC. If you are uninsured, it is recommended that you apply for financial assistance. (Forms may be requested at the front desk for in-person visits, via MyChart, or request a form during a virtual visit).  If you see the BHC more than 6 times, you will have to complete a comprehensive clinical assessment interview with the BHC to resume integrated services.  Any questions?  

## 2019-05-07 NOTE — Progress Notes (Signed)
Patient was assessed by nursing staff during this encounter. I have reviewed the chart and agree with the documentation and plan.  Verita Schneiders, MD 05/07/2019 12:15 PM

## 2019-05-08 NOTE — Progress Notes (Signed)
I have reviewed this chart and agree with the RN/CMA assessment and management.    Ezinne Yogi C Selig Wampole, MD, FACOG Attending Physician, [facility]  

## 2019-05-11 ENCOUNTER — Telehealth: Payer: Self-pay | Admitting: General Practice

## 2019-05-11 NOTE — Telephone Encounter (Signed)
Patient called into front office asking what she could take for a cold. She reports a lot of sneezing, coughing & bad sore throat. Advised patient a list was available in babyscripts for approved OTC medications and walked her through how to access it. Also advised she consider being tested for COVID and provided testing information. Patient verbalized understanding.

## 2019-05-12 ENCOUNTER — Other Ambulatory Visit: Payer: Self-pay

## 2019-05-12 ENCOUNTER — Encounter: Payer: Self-pay | Admitting: Internal Medicine

## 2019-05-12 ENCOUNTER — Ambulatory Visit (INDEPENDENT_AMBULATORY_CARE_PROVIDER_SITE_OTHER): Payer: BLUE CROSS/BLUE SHIELD | Admitting: Internal Medicine

## 2019-05-12 VITALS — BP 118/78 | HR 115 | Temp 98.9°F | Ht 63.0 in | Wt 137.0 lb

## 2019-05-12 DIAGNOSIS — E1065 Type 1 diabetes mellitus with hyperglycemia: Secondary | ICD-10-CM | POA: Diagnosis not present

## 2019-05-12 DIAGNOSIS — O24011 Pre-existing diabetes mellitus, type 1, in pregnancy, first trimester: Secondary | ICD-10-CM | POA: Diagnosis not present

## 2019-05-12 MED ORDER — INSULIN LISPRO 100 UNIT/ML CARTRIDGE: SUBCUTANEOUS | 11 refills | Status: DC

## 2019-05-12 MED ORDER — INSULIN GLARGINE 100 UNIT/ML SOLOSTAR PEN: 20.0000 [IU] | PEN_INJECTOR | Freq: Every day | SUBCUTANEOUS | 3 refills | Status: DC

## 2019-05-12 MED ORDER — GLUCAGON HCL 1 MG IJ SOLR: 1.0000 mg | INTRAMUSCULAR | 1 refills | Status: DC

## 2019-05-12 NOTE — Progress Notes (Signed)
Name: Ashley Freeman  Age/ Sex: 29 y.o., female   MRN/ DOB: [id number], 10/31/1990     PCP: Hoy Register, MD   Reason for Endocrinology Evaluation: Type 2 Diabetes Mellitus  Initial Endocrine Consultative Visit: 06/27/18    PATIENT IDENTIFIER: Ashley Freeman is a 29 y.o. female with a past medical history of T1DM with multiple DKA's. The patient has followed with Endocrinology clinic since 06/27/2018 for consultative assistance with management of her diabetes.  DIABETIC HISTORY:  [name] was diagnosed with T1DM at age 32. Hx of multiple DKA's due to non-compliance. Her hemoglobin A1c has ranged from  in 11.3% in 2011, peaking at 19.3% in 2015.   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  We decreased Lantus and continued Humalog.    Today (05/12/2019): [name] is here for a follow up on diabetes management during pregnancy. She is currently pregnant at [redacted] Weeks and 3 days. She  checks her blood sugars 3-4 times daily, through the dexcom . The patient has had  hypoglycemic episodes since the last clinic visit, which typically occur in the morning. The patient is symptomatic with these episodes. Pt recently was taken to the ED for seizures secondary to hypoglycemia BG 41 mg/dL per ED records   During her visit today she was vomiting most of the time, pt attributes this to leaving the house.  Las eye exam 12/12/2018    ROS: As per HPI and as detailed below: Review of Systems  Constitutional: Negative for chills and fever.  HENT: Negative for congestion and sore throat.   Cardiovascular: Negative for chest pain and palpitations.  Gastrointestinal: Negative for diarrhea and nausea.  Neurological: Negative for tingling and tremors.      HOME DIABETES REGIMEN:   Lantus 24 units daily  Novolog (Inpen) 6 units with Breakfast and lunch, 8 units with supper   CF :  (BG-100/50)      CONTINUOUS GLUCOSE MONITORING RECORD INTERPRETATION    Dates of Recording: 12/30-1/03/2020  Sensor description: Dexcom  Results statistics:   CGM use % of time 100  Average and SD 182/72  Time in range   54    %  % Time Above 180 27  % Time above 250 17  % Time Below target 1     Glycemic patterns summary: Glucose within goal overnight, hyperglycemic most times  of the day   Hyperglycemic episodes  Mainly postprandial   Hypoglycemic episodes occurred mainly over night   Overnight periods: decrease      HISTORY:  Past Medical History:  Past Medical History:  Diagnosis Date  . Diabetes mellitus 2001   Diagnosed at age 34 ; Type I  . Diarrhea 05/30/2016  . DKA (diabetic ketoacidoses) (HCC) 08/19/2013  . DKA (diabetic ketoacidoses) (HCC) 05/30/2016  . Gonorrhea 08/2011   Treated in 09/2011   Past Surgical History:  Past Surgical History:  Procedure Laterality Date  . INCISION AND DRAINAGE PERIRECTAL ABSCESS Right 08/18/2013   Procedure: IRRIGATION AND DEBRIDEMENT GLUTEAL ABSCESS;  Surgeon: Axel Filler, MD;  Location: [facility] OR;  Service: General;  Laterality: Right;  . INCISION AND DRAINAGE PERIRECTAL ABSCESS Right 09/19/2013   Procedure: IRRIGATION AND DEBRIDEMENT RIGHT GLUTEAL AND LABIAL ABSCESSES;  Surgeon: Axel Filler, MD;  Location: [facility] OR;  Service: General;  Laterality: Right;  . INCISION AND DRAINAGE PERIRECTAL ABSCESS Right 09/24/2013   Procedure: IRRIGATION AND DEBRIDEMENT PERIRECTAL ABSCESS;  Surgeon:  Cherylynn Ridges, MD;  Location[facility];  Service: General;  Laterality: Right;    Social History:  reports that she has never smoked. She has never used smokeless tobacco. She reports previous alcohol use. She reports that she does not use drugs. Family History:  Family History  Problem Relation Age of Onset  . Asthma Mother   . Carpal tunnel syndrome Mother   . Gout Father   . Diabetes Paternal Grandmother   . Anesthesia problems Neg Hx       HOME MEDICATIONS: Allergies as of 05/12/2019      Reactions   Penicillins Hives, Rash   Has patient had a PCN reaction causing immediate rash, facial/tongue/throat swelling, SOB or lightheadedness with hypotension: Yes Has patient had a PCN reaction causing severe rash involving mucus membranes or skin necrosis: No Has patient had a PCN reaction that required hospitalization: Yes Has patient had a PCN reaction occurring within the last 10 years: No If all of the above answers are "NO", then may proceed with Cephalosporin use.   Benadryl [diphenhydramine] Itching   Doxycycline Itching      Medication List       Accurate as of May 12, 2019  2:58 PM. If you have any questions, ask your nurse or doctor.        blood glucose meter kit and supplies Kit Dispense based on patient and insurance preference. Use up to four times daily as directed. (FOR ICD-9 250.00, 250.01).   Blood Pressure Kit Devi 1 Device by Does not apply route as needed.   cephALEXin 500 MG capsule Commonly known as: Keflex Take 1 capsule (500 mg total) by mouth 4 (four) times daily.   cephALEXin 500 MG capsule Commonly known as: KEFLEX Take 1 capsule (500 mg total) by mouth 4 (four) times daily.   Dexcom G6 Receiver Devi 1 Device by Does not apply route as directed.   Dexcom G6 Sensor Misc 1 Device by Does not apply route as directed.   Dexcom G6 Transmitter Misc 1 Device by Does not apply route as directed.   famotidine 20 MG tablet Commonly known as: PEPCID Take 1 tablet (20 mg total) by mouth 2 (two) times daily.   Glucagon HCl 1 MG Solr Inject 1 mg as directed as directed. Started by: Scarlette Shorts, MD   insulin aspart cartridge Commonly known as: NOVOLOG Inject 6 Units into the skin 3 (three) times daily with meals.   Insulin Glargine 100 UNIT/ML Solostar Pen Commonly known as: LANTUS Inject 20 Units into the skin daily. What changed: how much to take Changed by: Scarlette Shorts, MD   insulin lispro 100 UNIT/ML cartridge Commonly known as: HUMALOG Inject 0.06 mLs (6 Units total) into the skin daily with breakfast AND 0.06 mLs (6 Units total) daily with lunch AND 0.08 mLs (8 Units total) daily with supper. What changed: See the new instructions. Changed by: Scarlette Shorts, MD   loperamide 2 MG tablet Commonly known as: Imodium A-D Take 1 tablet (2 mg total) by mouth 4 (four) times daily as needed for diarrhea or loose stools.   ondansetron 4 MG disintegrating tablet Commonly known as: Zofran ODT Take 1 tablet (4 mg total) by mouth every 8 (eight) hours as needed for nausea or vomiting.        OBJECTIVE:   Vital Signs: BP 118/78 (BP Location: Left Arm, Patient Position: Sitting, Cuff Size: Normal)   Pulse (!) 115   Temp 98.9 F (  37.2 C)   Ht 5\' 3"  (1.6 m)   Wt 137 lb (62.1 kg)   LMP  (LMP Unknown)   SpO2 98%   BMI 24.27 kg/m   Wt Readings from Last 3 Encounters:  05/12/19 137 lb (62.1 kg)  04/08/19 135 lb 12.8 oz (61.6 kg)  04/01/19 130 lb 9.6 oz (59.2 kg)     Exam: General: Pt appears well and is in NAD  Neck: General: Supple without adenopathy. Thyroid: Thyroid size normal.  No goiter or nodules appreciated. No thyroid bruit.  Lungs: Clear with good BS bilat with no rales, rhonchi, or wheezes  Heart: RRR with normal S1 and S2 and no gallops; no murmurs; no rub  Abdomen: Normoactive bowel sounds, soft, nontender, without masses or organomegaly palpable  Extremities: No pretibial edema.  Skin: Normal texture and temperature to palpation. No rash noted.  Neuro: MS is good with appropriate affect, pt is alert and Ox3   DM foot exam: 04/08/2019 The skin of the feet is intact without sores or ulcerations. The pedal pulses are 2+ on right and 2+ on left. The sensation is intact to a screening 5.07, 10 gram monofilament bilaterally     DATA REVIEWED:  Lab Results  Component Value Date   HGBA1C 8.3 (A) 04/08/2019   HGBA1C  16.8 (H) 10/24/2018   HGBA1C  06/04/2018     Comment:     >15    Lab Results  Component Value Date   MICRALBCREAT 115.3 (H) 04/08/2019   Results for ZEBA, CERIO (MRN [id number]) as of 05/12/2019 14:50  Ref. Range 05/04/2019 13:46  Sodium Latest Ref Range: 135 - 145 mmol/L 135  Potassium Latest Ref Range: 3.5 - 5.1 mmol/L 3.6  Chloride Latest Ref Range: 98 - 111 mmol/L 107  CO2 Latest Ref Range: 22 - 32 mmol/L 20 (L)  Glucose Latest Ref Range: 70 - 99 mg/dL 272 (HH)  BUN Latest Ref Range: 6 - 20 mg/dL 14  Creatinine Latest Ref Range: 0.44 - 1.00 mg/dL 5.36  Calcium Latest Ref Range: 8.9 - 10.3 mg/dL 8.8 (L)  Anion gap Latest Ref Range: 5 - 15  8  Alkaline Phosphatase Latest Ref Range: 38 - 126 U/L 28 (L)  Albumin Latest Ref Range: 3.5 - 5.0 g/dL 2.5 (L)  AST Latest Ref Range: 15 - 41 U/L 23  ALT Latest Ref Range: 0 - 44 U/L 14  Total Protein Latest Ref Range: 6.5 - 8.1 g/dL 5.8 (L)  Total Bilirubin Latest Ref Range: 0.3 - 1.2 mg/dL 0.3  GFR, Est Non African American Latest Ref Range: >60 mL/min >60  GFR, Est African American Latest Ref Range: >60 mL/min >60    Results for RHELDA, HEITZMANN (MRN [id number]) as of 04/08/2019 16:35  Ref. Range 04/08/2019 10:20  MICROALB/CREAT RATIO Latest Ref Range: 0.0 - 30.0 mg/g 115.3 (H)    ASSESSMENT / PLAN / RECOMMENDATIONS:   1) Type 1 Diabetes Mellitus, Poorly controlled, With microalbuminuria, During First trimester - Most recent A1c of 8.3%. Goal A1c < 6.5%.    - Pt continues to use the Dexcom but unfortunately we are unable to download her inpen which would give [place] valuable information. I have asked her on the last visit and again today, to reach out to the inpen rep to help with pairing the inpen with the dexcom.  - She tends to have hypoglycemic episodes mainly overnight, will reduce her lantus dose as below - Unclear to me what caused her hypoglycemia on the  1/4th. Pt could not remember. - Pt tends to bolus for meals  after her meals rather then before, I explained to her this will increase her risk of hypoglycemia during the day , I have advised her to take it right before a meal.    MEDICATIONS:  Decrease Lantus to 20 units ONCE daily   Humalog 6 units with Breakfast and Lunch, 8 units with Supper  CF: Novolog (BG-100/50)   EDUCATION / INSTRUCTIONS:  BG monitoring instructions: Patient is instructed to check her blood sugars 7 times a day, before meals, 2 hr post-prandial and bedtime.  Call [place] [facility] if: BG persistently < 70 or > 300. . I reviewed the Rule of 15 for the treatment of hypoglycemia in detail with the patient. Literature supplied.     F/U in 1 month   Signed electronically by: Lyndle Herrlich, MD  [facility] Endocrinology  [facility] [address] [place], [place] [address] Phone: [phone] FAX: [phone]   CC: Hoy Register, MD [address] [place] [address] Phone: [phone]  Fax: [phone]  Return to Endocrinology clinic as below: Future Appointments  Date Time Provider Department Center  05/20/2019  8:35 AM Anyanwu, Jethro Bastos, MD [facility]  06/10/2019  1:20 PM Oday Ridings, Konrad Dolores, MD [facility] None  07/10/2019  9:00 AM [facility] NURSE [facility]  07/10/2019  9:00 AM [facility] [place] 3 [facility]

## 2019-05-12 NOTE — Patient Instructions (Signed)
-   Decrease Lantus to 20 units daily  - Continue Novolog 6 units with Breakfast, Lunch and 8 units with Supper       HOW TO TREAT LOW BLOOD SUGARS (Blood sugar LESS THAN 70 MG/DL)  Please follow the RULE OF 15 for the treatment of hypoglycemia treatment (when your (blood sugars are less than 70 mg/dL)    STEP 1: Take 15 grams of carbohydrates when your blood sugar is low, which includes:   3-4 GLUCOSE TABS  OR  3-4 OZ OF JUICE OR REGULAR SODA OR  ONE TUBE OF GLUCOSE GEL     STEP 2: RECHECK blood sugar in 15 MINUTES STEP 3: If your blood sugar is still low at the 15 minute recheck --> then, go back to STEP 1 and treat AGAIN with another 15 grams of carbohydrates.

## 2019-05-13 ENCOUNTER — Other Ambulatory Visit: Payer: Self-pay | Admitting: Internal Medicine

## 2019-05-13 MED ORDER — INSULIN ASPART 100 UNIT/ML CARTRIDGE (PENFILL): SUBCUTANEOUS | 11 refills | Status: DC

## 2019-05-20 ENCOUNTER — Encounter: Payer: Self-pay | Admitting: Obstetrics & Gynecology

## 2019-05-20 ENCOUNTER — Ambulatory Visit (INDEPENDENT_AMBULATORY_CARE_PROVIDER_SITE_OTHER): Payer: BLUE CROSS/BLUE SHIELD | Admitting: Obstetrics & Gynecology

## 2019-05-20 ENCOUNTER — Other Ambulatory Visit: Payer: Self-pay

## 2019-05-20 ENCOUNTER — Inpatient Hospital Stay (HOSPITAL_COMMUNITY): Admit: 2019-05-20 | Payer: BLUE CROSS/BLUE SHIELD

## 2019-05-20 VITALS — BP 119/76 | HR 120 | Wt 137.1 lb

## 2019-05-20 DIAGNOSIS — O219 Vomiting of pregnancy, unspecified: Secondary | ICD-10-CM | POA: Diagnosis not present

## 2019-05-20 DIAGNOSIS — O2341 Unspecified infection of urinary tract in pregnancy, first trimester: Secondary | ICD-10-CM | POA: Diagnosis not present

## 2019-05-20 DIAGNOSIS — O0991 Supervision of high risk pregnancy, unspecified, first trimester: Secondary | ICD-10-CM

## 2019-05-20 DIAGNOSIS — Z3A12 12 weeks gestation of pregnancy: Secondary | ICD-10-CM | POA: Diagnosis not present

## 2019-05-20 DIAGNOSIS — O24011 Pre-existing diabetes mellitus, type 1, in pregnancy, first trimester: Secondary | ICD-10-CM

## 2019-05-20 DIAGNOSIS — B951 Streptococcus, group B, as the cause of diseases classified elsewhere: Secondary | ICD-10-CM

## 2019-05-20 DIAGNOSIS — Z23 Encounter for immunization: Secondary | ICD-10-CM

## 2019-05-20 DIAGNOSIS — R809 Proteinuria, unspecified: Secondary | ICD-10-CM

## 2019-05-20 DIAGNOSIS — E1029 Type 1 diabetes mellitus with other diabetic kidney complication: Secondary | ICD-10-CM

## 2019-05-20 DIAGNOSIS — E1065 Type 1 diabetes mellitus with hyperglycemia: Secondary | ICD-10-CM

## 2019-05-20 DIAGNOSIS — O0990 Supervision of high risk pregnancy, unspecified, unspecified trimester: Secondary | ICD-10-CM

## 2019-05-20 MED ORDER — ASPIRIN EC 81 MG PO TBEC: 81.0000 mg | DELAYED_RELEASE_TABLET | Freq: Every day | ORAL | 2 refills | Status: DC

## 2019-05-20 MED ORDER — ONDANSETRON 4 MG PO TBDP: 4.0000 mg | ORAL_TABLET | Freq: Four times a day (QID) | ORAL | 0 refills | Status: DC | PRN

## 2019-05-20 MED ORDER — PROMETHAZINE HCL 25 MG PO TABS: 25.0000 mg | ORAL_TABLET | Freq: Four times a day (QID) | ORAL | 2 refills | Status: DC | PRN

## 2019-05-20 NOTE — Progress Notes (Signed)
Medicaid home form completed.   Apolonio Schneiders RN 05/20/19

## 2019-05-20 NOTE — Patient Instructions (Signed)
First Trimester of Pregnancy The first trimester of pregnancy is from week 1 until the end of week 13 (months 1 through 3). A week after a sperm fertilizes an egg, the egg will implant on the wall of the uterus. This embryo will begin to develop into a baby. Genes from you and your partner will form the baby. The female genes will determine whether the baby will be a boy or a girl. At 6-8 weeks, the eyes and face will be formed, and the heartbeat can be seen on ultrasound. At the end of 12 weeks, all the baby's organs will be formed. Now that you are pregnant, you will want to do everything you can to have a healthy baby. Two of the most important things are to get good prenatal care and to follow your health care provider's instructions. Prenatal care is all the medical care you receive before the baby's birth. This care will help prevent, find, and treat any problems during the pregnancy and childbirth. Body changes during your first trimester Your body goes through many changes during pregnancy. The changes vary from woman to woman.  You may gain or lose a couple of pounds at first.  You may feel sick to your stomach (nauseous) and you may throw up (vomit). If the vomiting is uncontrollable, call your health care provider.  You may tire easily.  You may develop headaches that can be relieved by medicines. All medicines should be approved by your health care provider.  You may urinate more often. Painful urination may mean you have a bladder infection.  You may develop heartburn as a result of your pregnancy.  You may develop constipation because certain hormones are causing the muscles that push stool through your intestines to slow down.  You may develop hemorrhoids or swollen veins (varicose veins).  Your breasts may begin to grow larger and become tender. Your nipples may stick out more, and the tissue that surrounds them (areola) may become darker.  Your gums may bleed and may be  sensitive to brushing and flossing.  Dark spots or blotches (chloasma, mask of pregnancy) may develop on your face. This will likely fade after the baby is born.  Your menstrual periods will stop.  You may have a loss of appetite.  You may develop cravings for certain kinds of food.  You may have changes in your emotions from day to day, such as being excited to be pregnant or being concerned that something may go wrong with the pregnancy and baby.  You may have more vivid and strange dreams.  You may have changes in your hair. These can include thickening of your hair, rapid growth, and changes in texture. Some women also have hair loss during or after pregnancy, or hair that feels dry or thin. Your hair will most likely return to normal after your baby is born. What to expect at prenatal visits During a routine prenatal visit:  You will be weighed to make sure you and the baby are growing normally.  Your blood pressure will be taken.  Your abdomen will be measured to track your baby's growth.  The fetal heartbeat will be listened to between weeks 10 and 14 of your pregnancy.  Test results from any previous visits will be discussed. Your health care provider may ask you:  How you are feeling.  If you are feeling the baby move.  If you have had any abnormal symptoms, such as leaking fluid, bleeding, severe headaches, or abdominal   cramping.  If you are using any tobacco products, including cigarettes, chewing tobacco, and electronic cigarettes.  If you have any questions. Other tests that may be performed during your first trimester include:  Blood tests to find your blood type and to check for the presence of any previous infections. The tests will also be used to check for low iron levels (anemia) and protein on red blood cells (Rh antibodies). Depending on your risk factors, or if you previously had diabetes during pregnancy, you may have tests to check for high blood sugar  that affects pregnant women (gestational diabetes).  Urine tests to check for infections, diabetes, or protein in the urine.  An ultrasound to confirm the proper growth and development of the baby.  Fetal screens for spinal cord problems (spina bifida) and Down syndrome.  HIV (human immunodeficiency virus) testing. Routine prenatal testing includes screening for HIV, unless you choose not to have this test.  You may need other tests to make sure you and the baby are doing well. Follow these instructions at home: Medicines  Follow your health care provider's instructions regarding medicine use. Specific medicines may be either safe or unsafe to take during pregnancy.  Take a prenatal vitamin that contains at least 600 micrograms (mcg) of folic acid.  If you develop constipation, try taking a stool softener if your health care provider approves. Eating and drinking   Eat a balanced diet that includes fresh fruits and vegetables, whole grains, good sources of protein such as meat, eggs, or tofu, and low-fat dairy. Your health care provider will help you determine the amount of weight gain that is right for you.  Avoid raw meat and uncooked cheese. These carry germs that can cause birth defects in the baby.  Eating four or five small meals rather than three large meals a day may help relieve nausea and vomiting. If you start to feel nauseous, eating a few soda crackers can be helpful. Drinking liquids between meals, instead of during meals, also seems to help ease nausea and vomiting.  Limit foods that are high in fat and processed sugars, such as fried and sweet foods.  To prevent constipation: ? Eat foods that are high in fiber, such as fresh fruits and vegetables, whole grains, and beans. ? Drink enough fluid to keep your urine clear or pale yellow. Activity  Exercise only as directed by your health care provider. Most women can continue their usual exercise routine during  pregnancy. Try to exercise for 30 minutes at least 5 days a week. Exercising will help you: ? Control your weight. ? Stay in shape. ? Be prepared for labor and delivery.  Experiencing pain or cramping in the lower abdomen or lower back is a good sign that you should stop exercising. Check with your health care provider before continuing with normal exercises.  Try to avoid standing for long periods of time. Move your legs often if you must stand in one place for a long time.  Avoid heavy lifting.  Wear low-heeled shoes and practice good posture.  You may continue to have sex unless your health care provider tells you not to. Relieving pain and discomfort  Wear a good support bra to relieve breast tenderness.  Take warm sitz baths to soothe any pain or discomfort caused by hemorrhoids. Use hemorrhoid cream if your health care provider approves.  Rest with your legs elevated if you have leg cramps or low back pain.  If you develop varicose veins in   your legs, wear support hose. Elevate your feet for 15 minutes, 3-4 times a day. Limit salt in your diet. Prenatal care  Schedule your prenatal visits by the twelfth week of pregnancy. They are usually scheduled monthly at first, then more often in the last 2 months before delivery.  Write down your questions. Take them to your prenatal visits.  Keep all your prenatal visits as told by your health care provider. This is important. Safety  Wear your seat belt at all times when driving.  Make a list of emergency phone numbers, including numbers for family, friends, the hospital, and police and fire departments. General instructions  Ask your health care provider for a referral to a local prenatal education class. Begin classes no later than the beginning of month 6 of your pregnancy.  Ask for help if you have counseling or nutritional needs during pregnancy. Your health care provider can offer advice or refer you to specialists for help  with various needs.  Do not use hot tubs, steam rooms, or saunas.  Do not douche or use tampons or scented sanitary pads.  Do not cross your legs for long periods of time.  Avoid cat litter boxes and soil used by cats. These carry germs that can cause birth defects in the baby and possibly loss of the fetus by miscarriage or stillbirth.  Avoid all smoking, herbs, alcohol, and medicines not prescribed by your health care provider. Chemicals in these products affect the formation and growth of the baby.  Do not use any products that contain nicotine or tobacco, such as cigarettes and e-cigarettes. If you need help quitting, ask your health care provider. You may receive counseling support and other resources to help you quit.  Schedule a dentist appointment. At home, brush your teeth with a soft toothbrush and be gentle when you floss. Contact a health care provider if:  You have dizziness.  You have mild pelvic cramps, pelvic pressure, or nagging pain in the abdominal area.  You have persistent nausea, vomiting, or diarrhea.  You have a bad smelling vaginal discharge.  You have pain when you urinate.  You notice increased swelling in your face, hands, legs, or ankles.  You are exposed to fifth disease or chickenpox.  You are exposed to German measles (rubella) and have never had it. Get help right away if:  You have a fever.  You are leaking fluid from your vagina.  You have spotting or bleeding from your vagina.  You have severe abdominal cramping or pain.  You have rapid weight gain or loss.  You vomit blood or material that looks like coffee grounds.  You develop a severe headache.  You have shortness of breath.  You have any kind of trauma, such as from a fall or a car accident. Summary  The first trimester of pregnancy is from week 1 until the end of week 13 (months 1 through 3).  Your body goes through many changes during pregnancy. The changes vary from  woman to woman.  You will have routine prenatal visits. During those visits, your health care provider will examine you, discuss any test results you may have, and talk with you about how you are feeling. This information is not intended to replace advice given to you by your health care provider. Make sure you discuss any questions you have with your health care provider. Document Revised: 03/29/2017 Document Reviewed: 03/28/2016 Elsevier Patient Education  2020 [employer]  

## 2019-05-20 NOTE — Progress Notes (Signed)
History:   Ashley Freeman is a 29 y.o. G2P0010 at 53w4dby early ultrasound being seen today for her first obstetrical visit.  Her obstetrical history is significant for: Patient Active Problem List   Diagnosis Date Noted  . Supervision of high risk pregnancy, antepartum 05/06/2019  . Pre-existing type 1 diabetes mellitus during pregnancy in first trimester ([place] 05/06/2019  . Diabetic neuropathy ([place] 01/16/2016  . GBS (group b Streptococcus) UTI complicating pregnancy, first trimester 04/08/2014  . Diabetic ketoacidosis without coma associated with type 1 diabetes mellitus (HF[place] 09/19/2013  . Diabetes mellitus type 1, uncontrolled, with complications ([place] 007/03/1974 Recent DKA was in 09/2018, and has hyperglycemia/hypoglycemia events.  Has neuropathy, denies retinopathy.  Also has some kidney dysfunction, recent Cr was 1.00 (baseline).  Had early 6 week SAB in 2018.   This is a desired pregnancy, patient has a girlfriend and her mom who are supportive. [facility] during pregnancy.  Patient reports nausea and desired medication.      HISTORY: OB History  Gravida Para Term Preterm AB Living  2 0 0 0 1 0  SAB TAB Ectopic Multiple Live Births  1 0 0 0 0    # Outcome Date GA Lbr Len/2nd Weight Sex Delivery Anes PTL Lv  2 Current           1 SAB 08/23/16 681w0d          Birth Comments: 1st trimester    Last pap smear was done 04/2018 and was normal  Past Medical History:  Diagnosis Date  . Abscess, gluteal, right 08/24/2013  . AKI (acute kidney injury) (HCHar[place]3/28/2016  . Anemia 02/19/2012  . Bartholin's gland abscess 09/19/2013  . BV (bacterial vaginosis) 11/24/2015  . Diabetes mellitus 2001   Diagnosed at age [age] Type I  . Diarrhea 05/30/2016  . DKA (diabetic ketoacidoses) (HCSand[place]4/22/2015  . DKA (diabetic ketoacidoses) (HCBax[place]01/31/2018  . Gonorrhea 08/2011   Treated in 09/2011  . History of trichomoniasis 05/31/2016  .  Hyperlipidemia 03/28/2016  . Sepsis (HCMcLean5/23/2015   Past Surgical History:  Procedure Laterality Date  . INCISION AND DRAINAGE PERIRECTAL ABSCESS Right 08/18/2013   Procedure: IRRIGATION AND DEBRIDEMENT GLUTEAL ABSCESS;  Surgeon: ArRalene OkMD;  [facility] Service: General;  Laterality: Right;  . INCISION AND DRAINAGE PERIRECTAL ABSCESS Right 09/19/2013   Procedure: IRRIGATION AND DEBRIDEMENT RIGHT GLUTEAL AND LABIAL ABSCESSES;  Surgeon: ArRalene OkMD;  [facility]: General;  Laterality: Right;  . INCISION AND DRAINAGE PERIRECTAL ABSCESS Right 09/24/2013   Procedure: IRRIGATION AND DEBRIDEMENT PERIRECTAL ABSCESS;  Surgeon: JaGwenyth OberMD;  [facility] Service: General;  Laterality: Right;   Family History  Problem Relation Age of Onset  . Asthma Mother   . Carpal tunnel syndrome Mother   . Gout Father   . Diabetes Paternal Grandmother   . Anesthesia problems Neg Hx    Social History   Tobacco Use  . Smoking status: Never Smoker  . Smokeless tobacco: Never Used  Substance Use Topics  . Alcohol use: Not Currently  . Drug use: No   Allergies  Allergen Reactions  . Penicillins Hives and Rash    Has patient had a PCN reaction causing immediate rash, facial/tongue/throat swelling, SOB or lightheadedness with hypotension: Yes Has patient had a PCN reaction causing severe rash involving mucus membranes or skin necrosis: No Has patient had a PCN reaction that  required hospitalization: Yes Has patient had a PCN reaction occurring within the last 10 years: No If all of the above answers are "NO", then may proceed with Cephalosporin use.  . Benadryl [Diphenhydramine] Itching  . Doxycycline Itching   Current Outpatient Medications on File Prior to Visit  Medication Sig Dispense Refill  . blood glucose meter kit and supplies KIT Dispense based on patient and insurance preference. Use up to four times daily as directed. (FOR ICD-9 250.00, 250.01). 1  each 0  . Blood Pressure Monitoring (BLOOD PRESSURE KIT) DEVI 1 Device by Does not apply route as needed. 1 each 0  . cephALEXin (KEFLEX) 500 MG capsule Take 1 capsule (500 mg total) by mouth 4 (four) times daily. 28 capsule 0  . Continuous Blood Gluc Receiver (DEXCOM G6 RECEIVER) DEVI 1 Device by Does not apply route as directed. 1 Device 0  . Continuous Blood Gluc Sensor (DEXCOM G6 SENSOR) MISC 1 Device by Does not apply route as directed. 3 each 6  . Continuous Blood Gluc Transmit (DEXCOM G6 TRANSMITTER) MISC 1 Device by Does not apply route as directed. 1 each 3  . famotidine (PEPCID) 20 MG tablet Take 1 tablet (20 mg total) by mouth 2 (two) times daily. 30 tablet 2  . Glucagon HCl 1 MG SOLR Inject 1 mg as directed as directed. 1 each 1  . insulin aspart (NOVOLOG) cartridge Inject 6 Units into the skin daily with breakfast AND 6 Units daily with lunch AND 8 Units daily with supper. 15 mL 11  . Insulin Glargine (LANTUS) 100 UNIT/ML Solostar Pen Inject 20 Units into the skin daily. 15 mL 3  . insulin lispro (HUMALOG) 100 UNIT/ML cartridge Inject 0.06 mLs (6 Units total) into the skin daily with breakfast AND 0.06 mLs (6 Units total) daily with lunch AND 0.08 mLs (8 Units total) daily with supper. 18 mL 11  . loperamide (IMODIUM A-D) 2 MG tablet Take 1 tablet (2 mg total) by mouth 4 (four) times daily as needed for diarrhea or loose stools. 30 tablet 0  . Prenatal MV & Min w/FA-DHA (PRENATAL ADULT GUMMY/DHA/FA PO) Take by mouth.     Current Facility-Administered Medications on File Prior to Visit  Medication Dose Route Frequency Provider Last Rate Last Admin  . Insulin Pen Needle (NOVOFINE) 200 each  20 packet Subcutaneous PRN Allie Bossier, MD        Review of Systems Pertinent items noted in HPI and remainder of comprehensive ROS otherwise negative. Physical Exam:   Vitals:   05/20/19 0849  BP: 119/76  Pulse: (!) 120  Weight: 137 lb 1.6 oz (62.2 kg)   Fetal Heart Rate (bpm):  159 General: well-developed, well-nourished female in no acute distress  Breasts:  normal appearance, no masses or tenderness bilaterally  Skin: normal coloration and turgor, no rashes  Neurologic: oriented, normal, negative, normal mood  Extremities: normal strength, tone, and muscle mass, ROM of all joints is normal  HEENT PERRLA, extraocular movement intact and sclera clear, anicteric  Mouth/Teeth mucous membranes moist, pharynx normal without lesions and dental hygiene good  Neck supple and no masses  Cardiovascular: regular rate and rhythm  Respiratory:  no respiratory distress, normal breath sounds  Abdomen: soft, non-tender; bowel sounds normal; no masses,  no organomegaly     Assessment:    Pregnancy: G2P0010 Patient Active Problem List   Diagnosis Date Noted  . Supervision of high risk pregnancy, antepartum 05/06/2019  . Pre-existing type 1 diabetes mellitus during  pregnancy in first trimester ([place]) 05/06/2019  . Diabetic neuropathy ([place]) 01/16/2016  . GBS (group b Streptococcus) UTI complicating pregnancy, first trimester 04/08/2014  . Diabetic ketoacidosis without coma associated with type 1 diabetes mellitus ([place]) 09/19/2013  . Diabetes mellitus type 1, uncontrolled, with complications ([place]) 07/86/7544     Plan:    1. Pre-existing type 1 diabetes mellitus during pregnancy in first trimester [facility]) Will co-manage with Endocrinology. Emphasized importance of glycemic control, risks of T1DM for mother and fetus discussed in detail. MFM consult done.  Emphasized need for frequent scans. ASA also prescribed  - Prenatal MV & Min w/FA-DHA (PRENATAL ADULT GUMMY/DHA/FA PO); Take by mouth. - Comprehensive metabolic panel - Hemoglobin A1c - Protein / creatinine ratio, urine - TSH - US Fetal Echocardiography; Future - aspirin EC 81 MG tablet; Take 1 tablet (81 mg total) by mouth daily. Take after 12 weeks for prevention of preeclampsia later in pregnancy  Dispense: 300 tablet;  Refill: 2 - AMB referral to maternal fetal medicine - [place] MFM Fetal Nuchal Translucency; Future  2. GBS (group b Streptococcus) UTI complicating pregnancy, first trimester Treated, with need intrapartum prophylaxis. Repeat culture done today. - Culture, OB Urine  3. Nausea and vomiting during pregnancy prior to [redacted] weeks gestation Antiemetics ordered - promethazine (PHENERGAN) 25 MG tablet; Take 1 tablet (25 mg total) by mouth every 6 (six) hours as needed for nausea or vomiting.  Dispense: 30 tablet; Refill: 2 - ondansetron (ZOFRAN ODT) 4 MG disintegrating tablet; Take 1 tablet (4 mg total) by mouth every 6 (six) hours as needed for nausea.  Dispense: 20 tablet; Refill: 0  4. Supervision of high risk pregnancy, antepartum - Flu Vaccine QUAD 36+ mos IM - GC/Chlamydia probe amp ([place])not at Gerty - Obstetric Panel, Including HIV - [place] MFM Fetal Nuchal Translucency; Future   Initial labs drawn. Continue prenatal vitamins. Genetic Screening discussed,NT scan and NIPS: ordered. Ultrasound discussed; fetal anatomic survey: ordered. Problem list reviewed and updated. The nature of [place] with multiple MDs and other Advanced Practice Providers was explained to patient; also emphasized that residents, students are part of our team. Routine obstetric precautions reviewed. Return in about 4 weeks (around 06/17/2019) for Virtual OB Visit.     Verita Schneiders, MD, [place] for [employer], [place]

## 2019-05-21 ENCOUNTER — Encounter: Payer: Self-pay | Admitting: *Deleted

## 2019-05-21 ENCOUNTER — Telehealth: Payer: Self-pay | Admitting: General Practice

## 2019-05-21 DIAGNOSIS — O1210 Gestational proteinuria, unspecified trimester: Secondary | ICD-10-CM | POA: Insufficient documentation

## 2019-05-21 DIAGNOSIS — E1029 Type 1 diabetes mellitus with other diabetic kidney complication: Secondary | ICD-10-CM | POA: Insufficient documentation

## 2019-05-21 LAB — COMPREHENSIVE METABOLIC PANEL
ALT: 18 IU/L (ref 0–32)
AST: 18 IU/L (ref 0–40)
Albumin/Globulin Ratio: 1.1 — ABNORMAL LOW (ref 1.2–2.2)
Albumin: 3.2 g/dL — ABNORMAL LOW (ref 3.9–5.0)
Alkaline Phosphatase: 46 IU/L (ref 39–117)
BUN/Creatinine Ratio: 14 (ref 9–23)
BUN: 15 mg/dL (ref 6–20)
Bilirubin Total: <0.2 mg/dL (ref 0.0–1.2)
CO2: 16 mmol/L — ABNORMAL LOW (ref 20–29)
Calcium: 9.1 mg/dL (ref 8.7–10.2)
Chloride: 103 mmol/L (ref 96–106)
Creatinine, Ser: 1.08 mg/dL — ABNORMAL HIGH (ref 0.57–1.00)
GFR calc Af Amer: 82 mL/min/{1.73_m2} (ref >59)
GFR calc non Af Amer: 71 mL/min/{1.73_m2} (ref >59)
Globulin, Total: 2.8 g/dL (ref 1.5–4.5)
Glucose: 178 mg/dL — ABNORMAL HIGH (ref 65–99)
Potassium: 4.3 mmol/L (ref 3.5–5.2)
Sodium: 135 mmol/L (ref 134–144)
Total Protein: 6 g/dL (ref 6.0–8.5)

## 2019-05-21 LAB — GC/CHLAMYDIA PROBE AMP (~~LOC~~) NOT AT ARMC
Chlamydia: NEGATIVE
Comment: NEGATIVE
Comment: NORMAL
Neisseria Gonorrhea: NEGATIVE

## 2019-05-21 LAB — OBSTETRIC PANEL, INCLUDING HIV
Antibody Screen: NEGATIVE
Basophils Absolute: 0 10*3/uL (ref 0.0–0.2)
Basos: 0 %
EOS (ABSOLUTE): 0.4 10*3/uL (ref 0.0–0.4)
Eos: 5 %
HIV Screen 4th Generation wRfx: NONREACTIVE
Hematocrit: 30.4 % — ABNORMAL LOW (ref 34.0–46.6)
Hemoglobin: 10.3 g/dL — ABNORMAL LOW (ref 11.1–15.9)
Hepatitis B Surface Ag: NEGATIVE
Immature Grans (Abs): 0.1 10*3/uL (ref 0.0–0.1)
Immature Granulocytes: 1 %
Lymphocytes Absolute: 2 10*3/uL (ref 0.7–3.1)
Lymphs: 22 %
MCH: 27.3 pg (ref 26.6–33.0)
MCHC: 33.9 g/dL (ref 31.5–35.7)
MCV: 81 fL (ref 79–97)
Monocytes Absolute: 0.6 10*3/uL (ref 0.1–0.9)
Monocytes: 7 %
Neutrophils Absolute: 6.1 10*3/uL (ref 1.4–7.0)
Neutrophils: 65 %
Platelets: 289 10*3/uL (ref 150–450)
RBC: 3.77 x10E6/uL (ref 3.77–5.28)
RDW: 14.7 % (ref 11.7–15.4)
RPR Ser Ql: NONREACTIVE
Rh Factor: POSITIVE
Rubella Antibodies, IGG: 7.23 index (ref >0.99)
WBC: 9.2 10*3/uL (ref 3.4–10.8)

## 2019-05-21 LAB — PROTEIN / CREATININE RATIO, URINE
Creatinine, Urine: 122.5 mg/dL
Protein, Ur: 631.5 mg/dL
Protein/Creat Ratio: 5155 mg/g creat — ABNORMAL HIGH (ref 0–200)

## 2019-05-21 LAB — HEMOGLOBIN A1C
Est. average glucose Bld gHb Est-mCnc: 177 mg/dL
Hgb A1c MFr Bld: 7.8 % — ABNORMAL HIGH (ref 4.8–5.6)

## 2019-05-21 LAB — TSH: TSH: 4.47 u[IU]/mL (ref 0.450–4.500)

## 2019-05-21 NOTE — Telephone Encounter (Signed)
Received phone call from [employer] patient had elevated BP of 143/99 with recheck 127/80. Per chart review patient had OB visit yesterday and BP was 119/76. Reviewed with Dr Roselie Awkward who states BP is fine for now, but patient should monitor closely.   Called patient and reviewed BP with her. Discussed checking her BP weekly and rechecking 5 minutes later if BP is elevated. Also discussed checking BP if she develops a headache with dizziness or blurry vision. Patient verbalized understanding & had no questions.

## 2019-05-22 LAB — URINE CULTURE, OB REFLEX: Organism ID, Bacteria: NO GROWTH

## 2019-05-22 LAB — CULTURE, OB URINE

## 2019-06-01 ENCOUNTER — Other Ambulatory Visit: Payer: Self-pay | Admitting: *Deleted

## 2019-06-01 ENCOUNTER — Telehealth: Payer: Self-pay | Admitting: *Deleted

## 2019-06-01 DIAGNOSIS — E1029 Type 1 diabetes mellitus with other diabetic kidney complication: Secondary | ICD-10-CM

## 2019-06-01 DIAGNOSIS — O0990 Supervision of high risk pregnancy, unspecified, unspecified trimester: Secondary | ICD-10-CM

## 2019-06-01 DIAGNOSIS — O169 Unspecified maternal hypertension, unspecified trimester: Secondary | ICD-10-CM

## 2019-06-01 DIAGNOSIS — O99012 Anemia complicating pregnancy, second trimester: Secondary | ICD-10-CM

## 2019-06-01 MED ORDER — FUSION PLUS PO CAPS: 1.0000 | ORAL_CAPSULE | Freq: Every day | ORAL | 1 refills | Status: DC

## 2019-06-01 NOTE — Telephone Encounter (Signed)
Received [employer] alert bp on 05/29/19 of 138/90. Per [employer] no further bp's logged in and per chart no visits / calls since then. I called Ashley Freeman and asked how she is. She states had a cold with cough and sneezing. She reports she has already been covid testing since symptoms began and is negative. Advised is symptoms worsen to notify us. We discussed her bp alert and I asked her to take bp again. It was 128/87. She c/o mild headaches at times including today but states not bad enough to take tylenol. Denies edema. Informed her I would discuss with MD.    Reviewed labs drawn 05/20/19 , history significant for DM1, etc with Dr. Ilda Basset. Orders for patient to do 24 hour urine asap before next ob visit 06/17/19. Orders to change PNV to Fusion plus which has iron because of anemia. I explained this plan to [facility] and she agreed to lab appt tomorrow to pick up 24 hour urine kit and  Instructions. I reviewed with her to repeat bp anytime 140/ 90 or higher and to call us. She voices understanding.  Ashley Upshur,RN

## 2019-06-02 ENCOUNTER — Telehealth: Payer: Self-pay | Admitting: *Deleted

## 2019-06-02 ENCOUNTER — Encounter: Payer: Self-pay | Admitting: *Deleted

## 2019-06-02 ENCOUNTER — Other Ambulatory Visit: Payer: BLUE CROSS/BLUE SHIELD

## 2019-06-02 ENCOUNTER — Other Ambulatory Visit: Payer: Self-pay

## 2019-06-02 DIAGNOSIS — O169 Unspecified maternal hypertension, unspecified trimester: Secondary | ICD-10-CM

## 2019-06-02 DIAGNOSIS — E1029 Type 1 diabetes mellitus with other diabetic kidney complication: Secondary | ICD-10-CM

## 2019-06-02 DIAGNOSIS — O0990 Supervision of high risk pregnancy, unspecified, unspecified trimester: Secondary | ICD-10-CM

## 2019-06-02 NOTE — Telephone Encounter (Signed)
Ashley Freeman in office for lab draw. Asked for results of panorama because she is  Having a gender review. I retrieved her results which did not show gender but showed high risk due to fetal DNA fraction. I reviewed with Gavin Pound, CNM and then discussed with [facility] recommendations per Janett Billow. I explained her panorama results came back and they showed insufficient fetal DNA  And that an additional analysis was performed. I explained she has a choice to redraw panorama or proceed with genetic counseling. She elects to redraw panorama and it was redrawn today. She voices understanding. Henning Ehle,RN

## 2019-06-03 ENCOUNTER — Telehealth: Payer: Self-pay | Admitting: Obstetrics and Gynecology

## 2019-06-03 NOTE — Telephone Encounter (Signed)
Patient stated she has concerns of the genetic testing results. She stated the test posted in mychart displays negative however she was told at her appointment yesterday that something was wrong with her baby. She would like a call back to discuss.

## 2019-06-03 NOTE — Telephone Encounter (Addendum)
Called pt and pt informed me that she was told that she had abnormal genetic results but when she looked in MyChart she saw normal results.  I explained to the pt that she may have seen the [facility] results which are normal however if she looks for Panorama results it will show what the nurse reviewed with yesterday with.  Pt stated okay and did have any other questions.    Mel Almond, RN 06/03/19

## 2019-06-05 LAB — PROTEIN, URINE, 24 HOUR
Protein, 24H Urine: 2526 mg/24 hr — ABNORMAL HIGH (ref 30–150)
Protein, Ur: 126.3 mg/dL

## 2019-06-09 LAB — CREATININE CLEARANCE, URINE, 24 HOUR
Creatinine, 24H Ur: 940 mg/24 hr (ref 800–1800)
Creatinine, Urine: 47 mg/dL

## 2019-06-10 ENCOUNTER — Other Ambulatory Visit: Payer: Self-pay | Admitting: Family Medicine

## 2019-06-10 ENCOUNTER — Other Ambulatory Visit: Payer: Self-pay

## 2019-06-10 ENCOUNTER — Other Ambulatory Visit: Payer: Self-pay | Admitting: General Practice

## 2019-06-10 ENCOUNTER — Telehealth: Payer: Self-pay | Admitting: Family Medicine

## 2019-06-10 ENCOUNTER — Encounter: Payer: BLUE CROSS/BLUE SHIELD | Admitting: Internal Medicine

## 2019-06-10 ENCOUNTER — Telehealth (INDEPENDENT_AMBULATORY_CARE_PROVIDER_SITE_OTHER): Payer: BLUE CROSS/BLUE SHIELD | Admitting: General Practice

## 2019-06-10 DIAGNOSIS — R898 Other abnormal findings in specimens from other organs, systems and tissues: Secondary | ICD-10-CM

## 2019-06-10 DIAGNOSIS — E1065 Type 1 diabetes mellitus with hyperglycemia: Secondary | ICD-10-CM

## 2019-06-10 DIAGNOSIS — O0990 Supervision of high risk pregnancy, unspecified, unspecified trimester: Secondary | ICD-10-CM

## 2019-06-10 NOTE — Progress Notes (Signed)
Name: Ashley Freeman  Age/ Sex: 29 y.o., female   MRN/ DOB: [id number], 1990-05-14     PCP: Charlott Rakes, MD   Reason for Endocrinology Evaluation: Type 2 Diabetes Mellitus  Initial Endocrine Consultative Visit: 06/27/18    PATIENT IDENTIFIER: [name] is a 29 y.o. female with a past medical history of T1DM with multiple DKA's. The patient has followed with Endocrinology clinic since 06/27/2018 for consultative assistance with management of her diabetes.  DIABETIC HISTORY:  [name] was diagnosed with T1DM at age 45. Hx of multiple DKA's due to non-compliance. Her hemoglobin A1c has ranged from  in 11.3% in 2011, peaking at 19.3% in 2015.   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  We decreased Lantus and continued Humalog.    Today (06/10/2019): [name] is here for a follow up on diabetes management during pregnancy. She is currently pregnant at [redacted] Weeks and 3 days. She  checks her blood sugars 3-4 times daily, through the dexcom . The patient has had  hypoglycemic episodes since the last clinic visit, which typically occur in the morning. The patient is symptomatic with these episodes. Pt recently was taken to the ED for seizures secondary to hypoglycemia BG 41 mg/dL per ED records   During her visit today she was vomiting most of the time, pt attributes this to leaving the house.  [facility] eye exam 12/12/2018    ROS: As per HPI and as detailed below: Review of Systems  Constitutional: Negative for chills and fever.  HENT: Negative for congestion and sore throat.   Cardiovascular: Negative for chest pain and palpitations.  Gastrointestinal: Negative for diarrhea and nausea.  Neurological: Negative for tingling and tremors.       HOME DIABETES REGIMEN:   Lantus 24 units daily  Novolog ([place]) 6 units with Breakfast and lunch, 8 units with supper   CF  : (BG-100/50)      CONTINUOUS GLUCOSE MONITORING RECORD INTERPRETATION    Dates of Recording: 12/30-1/03/2020  Sensor description: Dexcom  Results statistics:   CGM use % of time 100  Average and SD 182/72  Time in range   54    %  % Time Above 180 27  % Time above 250 17  % Time Below target 1     Glycemic patterns summary: Glucose within goal overnight, hyperglycemic most times  of the day   Hyperglycemic episodes  Mainly postprandial   Hypoglycemic episodes occurred mainly over night   Overnight periods: decrease   In-Pen report (14 day) :  Average glucose : 180 mg/dL Average and SD 180/60  Time in range   55  %  % Time Above 180 33  % Time above 250 12   Missed Doses : 38 doses  TTD 34.6 units   HISTORY:  Past Medical History:  Past Medical History:  Diagnosis Date  . Abscess, gluteal, right 08/24/2013  . AKI (acute kidney injury) ([place]) 07/26/2014  . Anemia 02/19/2012  . Bartholin's gland abscess 09/19/2013  . BV (bacterial vaginosis) 11/24/2015  . Diabetes mellitus 2001   Diagnosed at age 27 ; Type I  . Diarrhea 05/30/2016  . DKA (diabetic ketoacidoses) ([place]) 08/19/2013  . DKA (diabetic ketoacidoses) ([place]) 05/30/2016  . Gonorrhea 08/2011   Treated in 09/2011  . History of trichomoniasis 05/31/2016  . Hyperlipidemia 03/28/2016  . Sepsis ([place]) 09/19/2013   Past Surgical History:  Past  Name: Ashley Freeman  Age/ Sex: 29 y.o., female   MRN/ DOB: [id number], 1990-05-14     PCP: Charlott Rakes, MD   Reason for Endocrinology Evaluation: Type 2 Diabetes Mellitus  Initial Endocrine Consultative Visit: 06/27/18    PATIENT IDENTIFIER: [name] is a 29 y.o. female with a past medical history of T1DM with multiple DKA's. The patient has followed with Endocrinology clinic since 06/27/2018 for consultative assistance with management of her diabetes.  DIABETIC HISTORY:  [name] was diagnosed with T1DM at age 45. Hx of multiple DKA's due to non-compliance. Her hemoglobin A1c has ranged from  in 11.3% in 2011, peaking at 19.3% in 2015.   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  We decreased Lantus and continued Humalog.    Today (06/10/2019): [name] is here for a follow up on diabetes management during pregnancy. She is currently pregnant at [redacted] Weeks and 3 days. She  checks her blood sugars 3-4 times daily, through the dexcom . The patient has had  hypoglycemic episodes since the last clinic visit, which typically occur in the morning. The patient is symptomatic with these episodes. Pt recently was taken to the ED for seizures secondary to hypoglycemia BG 41 mg/dL per ED records   During her visit today she was vomiting most of the time, pt attributes this to leaving the house.  [facility] eye exam 12/12/2018    ROS: As per HPI and as detailed below: Review of Systems  Constitutional: Negative for chills and fever.  HENT: Negative for congestion and sore throat.   Cardiovascular: Negative for chest pain and palpitations.  Gastrointestinal: Negative for diarrhea and nausea.  Neurological: Negative for tingling and tremors.       HOME DIABETES REGIMEN:   Lantus 24 units daily  Novolog ([place]) 6 units with Breakfast and lunch, 8 units with supper   CF  : (BG-100/50)      CONTINUOUS GLUCOSE MONITORING RECORD INTERPRETATION    Dates of Recording: 12/30-1/03/2020  Sensor description: Dexcom  Results statistics:   CGM use % of time 100  Average and SD 182/72  Time in range   54    %  % Time Above 180 27  % Time above 250 17  % Time Below target 1     Glycemic patterns summary: Glucose within goal overnight, hyperglycemic most times  of the day   Hyperglycemic episodes  Mainly postprandial   Hypoglycemic episodes occurred mainly over night   Overnight periods: decrease   In-Pen report (14 day) :  Average glucose : 180 mg/dL Average and SD 180/60  Time in range   55  %  % Time Above 180 33  % Time above 250 12   Missed Doses : 38 doses  TTD 34.6 units   HISTORY:  Past Medical History:  Past Medical History:  Diagnosis Date  . Abscess, gluteal, right 08/24/2013  . AKI (acute kidney injury) ([place]) 07/26/2014  . Anemia 02/19/2012  . Bartholin's gland abscess 09/19/2013  . BV (bacterial vaginosis) 11/24/2015  . Diabetes mellitus 2001   Diagnosed at age 27 ; Type I  . Diarrhea 05/30/2016  . DKA (diabetic ketoacidoses) ([place]) 08/19/2013  . DKA (diabetic ketoacidoses) ([place]) 05/30/2016  . Gonorrhea 08/2011   Treated in 09/2011  . History of trichomoniasis 05/31/2016  . Hyperlipidemia 03/28/2016  . Sepsis ([place]) 09/19/2013   Past Surgical History:  Past  Name: Ashley Freeman  Age/ Sex: 29 y.o., female   MRN/ DOB: [id number], 1990-05-14     PCP: Charlott Rakes, MD   Reason for Endocrinology Evaluation: Type 2 Diabetes Mellitus  Initial Endocrine Consultative Visit: 06/27/18    PATIENT IDENTIFIER: [name] is a 29 y.o. female with a past medical history of T1DM with multiple DKA's. The patient has followed with Endocrinology clinic since 06/27/2018 for consultative assistance with management of her diabetes.  DIABETIC HISTORY:  [name] was diagnosed with T1DM at age 45. Hx of multiple DKA's due to non-compliance. Her hemoglobin A1c has ranged from  in 11.3% in 2011, peaking at 19.3% in 2015.   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  We decreased Lantus and continued Humalog.    Today (06/10/2019): [name] is here for a follow up on diabetes management during pregnancy. She is currently pregnant at [redacted] Weeks and 3 days. She  checks her blood sugars 3-4 times daily, through the dexcom . The patient has had  hypoglycemic episodes since the last clinic visit, which typically occur in the morning. The patient is symptomatic with these episodes. Pt recently was taken to the ED for seizures secondary to hypoglycemia BG 41 mg/dL per ED records   During her visit today she was vomiting most of the time, pt attributes this to leaving the house.  Las eye exam 12/12/2018    ROS: As per HPI and as detailed below: Review of Systems  Constitutional: Negative for chills and fever.  HENT: Negative for congestion and sore throat.   Cardiovascular: Negative for chest pain and palpitations.  Gastrointestinal: Negative for diarrhea and nausea.  Neurological: Negative for tingling and tremors.       HOME DIABETES REGIMEN:   Lantus 24 units daily  Novolog ([place]) 6 units with Breakfast and lunch, 8 units with supper   CF  : (BG-100/50)      CONTINUOUS GLUCOSE MONITORING RECORD INTERPRETATION    Dates of Recording: 12/30-1/03/2020  Sensor description: Dexcom  Results statistics:   CGM use % of time 100  Average and SD 182/72  Time in range   54    %  % Time Above 180 27  % Time above 250 17  % Time Below target 1     Glycemic patterns summary: Glucose within goal overnight, hyperglycemic most times  of the day   Hyperglycemic episodes  Mainly postprandial   Hypoglycemic episodes occurred mainly over night   Overnight periods: decrease   In-Pen report (14 day) :  Average glucose : 180 mg/dL Average and SD 180/60  Time in range   55  %  % Time Above 180 33  % Time above 250 12   Missed Doses : 38 doses  TTD 34.6 units   HISTORY:  Past Medical History:  Past Medical History:  Diagnosis Date  . Abscess, gluteal, right 08/24/2013  . AKI (acute kidney injury) ([place]) 07/26/2014  . Anemia 02/19/2012  . Bartholin's gland abscess 09/19/2013  . BV (bacterial vaginosis) 11/24/2015  . Diabetes mellitus 2001   Diagnosed at age 27 ; Type I  . Diarrhea 05/30/2016  . DKA (diabetic ketoacidoses) ([place]) 08/19/2013  . DKA (diabetic ketoacidoses) ([place]) 05/30/2016  . Gonorrhea 08/2011   Treated in 09/2011  . History of trichomoniasis 05/31/2016  . Hyperlipidemia 03/28/2016  . Sepsis ([place]) 09/19/2013   Past Surgical History:  Past  Name: Ashley Freeman  Age/ Sex: 29 y.o., female   MRN/ DOB: [id number], 1990-05-14     PCP: Charlott Rakes, MD   Reason for Endocrinology Evaluation: Type 2 Diabetes Mellitus  Initial Endocrine Consultative Visit: 06/27/18    PATIENT IDENTIFIER: [name] is a 29 y.o. female with a past medical history of T1DM with multiple DKA's. The patient has followed with Endocrinology clinic since 06/27/2018 for consultative assistance with management of her diabetes.  DIABETIC HISTORY:  [name] was diagnosed with T1DM at age 45. Hx of multiple DKA's due to non-compliance. Her hemoglobin A1c has ranged from  in 11.3% in 2011, peaking at 19.3% in 2015.   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  We decreased Lantus and continued Humalog.    Today (06/10/2019): [name] is here for a follow up on diabetes management during pregnancy. She is currently pregnant at [redacted] Weeks and 3 days. She  checks her blood sugars 3-4 times daily, through the dexcom . The patient has had  hypoglycemic episodes since the last clinic visit, which typically occur in the morning. The patient is symptomatic with these episodes. Pt recently was taken to the ED for seizures secondary to hypoglycemia BG 41 mg/dL per ED records   During her visit today she was vomiting most of the time, pt attributes this to leaving the house.  [facility] eye exam 12/12/2018    ROS: As per HPI and as detailed below: Review of Systems  Constitutional: Negative for chills and fever.  HENT: Negative for congestion and sore throat.   Cardiovascular: Negative for chest pain and palpitations.  Gastrointestinal: Negative for diarrhea and nausea.  Neurological: Negative for tingling and tremors.       HOME DIABETES REGIMEN:   Lantus 24 units daily  Novolog ([place]) 6 units with Breakfast and lunch, 8 units with supper   CF  : (BG-100/50)      CONTINUOUS GLUCOSE MONITORING RECORD INTERPRETATION    Dates of Recording: 12/30-1/03/2020  Sensor description: Dexcom  Results statistics:   CGM use % of time 100  Average and SD 182/72  Time in range   54    %  % Time Above 180 27  % Time above 250 17  % Time Below target 1     Glycemic patterns summary: Glucose within goal overnight, hyperglycemic most times  of the day   Hyperglycemic episodes  Mainly postprandial   Hypoglycemic episodes occurred mainly over night   Overnight periods: decrease   In-Pen report (14 day) :  Average glucose : 180 mg/dL Average and SD 180/60  Time in range   55  %  % Time Above 180 33  % Time above 250 12   Missed Doses : 38 doses  TTD 34.6 units   HISTORY:  Past Medical History:  Past Medical History:  Diagnosis Date  . Abscess, gluteal, right 08/24/2013  . AKI (acute kidney injury) ([place]) 07/26/2014  . Anemia 02/19/2012  . Bartholin's gland abscess 09/19/2013  . BV (bacterial vaginosis) 11/24/2015  . Diabetes mellitus 2001   Diagnosed at age 27 ; Type I  . Diarrhea 05/30/2016  . DKA (diabetic ketoacidoses) ([place]) 08/19/2013  . DKA (diabetic ketoacidoses) ([place]) 05/30/2016  . Gonorrhea 08/2011   Treated in 09/2011  . History of trichomoniasis 05/31/2016  . Hyperlipidemia 03/28/2016  . Sepsis ([place]) 09/19/2013   Past Surgical History:  Past

## 2019-06-10 NOTE — Telephone Encounter (Signed)
Pt called back and wanted to know the sex of the baby. Reviewed with her that the Genetic Screening was not able to be completed as was discussed earlier and will not know sex until the anatomy US is completed. Pt voiced understanding.

## 2019-06-10 NOTE — Telephone Encounter (Signed)
Patient state she is returning a nurse call

## 2019-06-10 NOTE — Telephone Encounter (Signed)
Per Dr Harolyn Rutherford, patient's panorama is still showing high risk for low fetal fraction and needs referral to genetic counselor. Scheduled appt 2/17 @ 9am. Called and informed patient of results and appt information. Patient verbalized understanding & had no questions.

## 2019-06-12 ENCOUNTER — Ambulatory Visit (INDEPENDENT_AMBULATORY_CARE_PROVIDER_SITE_OTHER): Payer: BLUE CROSS/BLUE SHIELD | Admitting: Internal Medicine

## 2019-06-12 ENCOUNTER — Other Ambulatory Visit: Payer: Self-pay

## 2019-06-12 ENCOUNTER — Encounter: Payer: Self-pay | Admitting: Internal Medicine

## 2019-06-12 VITALS — BP 114/68 | HR 111 | Temp 98.3°F | Ht 63.0 in | Wt 133.2 lb

## 2019-06-12 DIAGNOSIS — E1065 Type 1 diabetes mellitus with hyperglycemia: Secondary | ICD-10-CM | POA: Diagnosis not present

## 2019-06-12 LAB — BASIC METABOLIC PANEL
BUN: 8 mg/dL (ref 6–23)
CO2: 18 mEq/L — ABNORMAL LOW (ref 19–32)
Calcium: 8.3 mg/dL — ABNORMAL LOW (ref 8.4–10.5)
Chloride: 108 mEq/L (ref 96–112)
Creatinine, Ser: 0.83 mg/dL (ref 0.40–1.20)
GFR: 99.76 mL/min (ref >60.00)
Glucose, Bld: 206 mg/dL — ABNORMAL HIGH (ref 70–99)
Potassium: 4 mEq/L (ref 3.5–5.1)
Sodium: 133 mEq/L — ABNORMAL LOW (ref 135–145)

## 2019-06-12 MED ORDER — INSULIN GLARGINE 100 UNIT/ML SOLOSTAR PEN: 20.0000 [IU] | PEN_INJECTOR | Freq: Every day | SUBCUTANEOUS | 3 refills | Status: DC

## 2019-06-12 NOTE — Patient Instructions (Addendum)
-   Decrease Lantus to 18 units daily  - Decrease Novolog 5 units with Breakfast, Lunch and Supper        HOW TO TREAT LOW BLOOD SUGARS (Blood sugar LESS THAN 70 MG/DL)  Please follow the RULE OF 15 for the treatment of hypoglycemia treatment (when your (blood sugars are less than 70 mg/dL)    STEP 1: Take 15 grams of carbohydrates when your blood sugar is low, which includes:   3-4 GLUCOSE TABS  OR  3-4 OZ OF JUICE OR REGULAR SODA OR  ONE TUBE OF GLUCOSE GEL     STEP 2: RECHECK blood sugar in 15 MINUTES STEP 3: If your blood sugar is still low at the 15 minute recheck --> then, go back to STEP 1 and treat AGAIN with another 15 grams of carbohydrates.

## 2019-06-12 NOTE — Progress Notes (Signed)
Name: Ashley Freeman  Age/ Sex: 29 y.o., female   MRN/ DOB: [id number], 06/29/1990     PCP: Ashley Register, MD   Reason for Endocrinology Evaluation: Type 2 Diabetes Mellitus  Initial Endocrine Consultative Visit: 06/27/18    PATIENT IDENTIFIER: [name] is a 29 y.o. female with a past medical history of T1DM with multiple DKA's. The patient has followed with Endocrinology clinic since 06/27/2018 for consultative assistance with management of her diabetes.  DIABETIC HISTORY:  [name] was diagnosed with T1DM at age 82. Hx of multiple DKA's due to non-compliance. Her hemoglobin A1c has ranged from  in 11.3% in 2011, peaking at 19.3% in 2015.   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  We decreased Lantus and continued Humalog.    Today (06/12/2019): [name] is here for a follow up on diabetes management during pregnancy. She is currently pregnant at 15.6 Weeks of gestation. She  checks her blood sugars 3-4 times daily, through the dexcom . The states she  has had hypoglycemic episodes since the last clinic visit, which typically occur in the morning.  Las eye exam 12/12/2018  Has an orange soda at 9 AM,  Repeat 219   ROS: As per HPI and as detailed below: Review of Systems  Constitutional: Negative for chills and fever.  HENT: Negative for congestion and sore throat.   Cardiovascular: Negative for chest pain and palpitations.  Gastrointestinal: Negative for diarrhea and nausea.  Neurological: Negative for tingling and tremors.       HOME DIABETES REGIMEN:   Lantus 20  units daily  Novolog (Inpen) 6 units with Breakfast and lunch, 8 units with supper - does not take   CF : (BG-100/50)      CONTINUOUS GLUCOSE MONITORING RECORD INTERPRETATION    Dates of Recording: 1/30-2/03/2020  Sensor description: Dexcom  Results statistics:   CGM use % of  time 93  Average and SD 173/58  Time in range   58  %  % Time Above 180 32  % Time above 250 9  % Time Below target 1     Glycemic patterns summary: hyperglycemia during the day and night  Hyperglycemic episodes  Mainly postprandial   Hypoglycemic episodes occurred mainly during the day after a bolus  Overnight periods: steady  In-Pen report (14 day) :  Average glucose : 180 mg/dL Average and SD 782/95  Time in range   55  %  % Time Above 180 33  % Time above 250 12   Missed Doses : 38 doses  TTD 34.6 units             HISTORY:  Past Medical History:  Past Medical History:  Diagnosis Date  . Abscess, gluteal, right 08/24/2013  . AKI (acute kidney injury) (HCC) 07/26/2014  . Anemia 02/19/2012  . Bartholin's gland abscess 09/19/2013  . BV (bacterial vaginosis) 11/24/2015  . Diabetes mellitus 2001   Diagnosed at age 29 ; Type I  . Diarrhea 05/30/2016  . DKA (diabetic ketoacidoses) (HCC) 08/19/2013  . DKA (diabetic ketoacidoses) (HCC) 05/30/2016  . Gonorrhea 08/2011   Treated in 09/2011  . History of trichomoniasis 05/31/2016  . Hyperlipidemia 03/28/2016  . Sepsis (HCC) 09/19/2013   Past Surgical History:  Past Surgical History:  Procedure Laterality Date  . INCISION AND DRAINAGE PERIRECTAL ABSCESS Right 08/18/2013   Procedure: IRRIGATION AND DEBRIDEMENT GLUTEAL ABSCESS;  Surgeon: Jed Limerick  Derrell Lolling, MD;  Location: [facility] OR;  Service: General;  Laterality: Right;  . INCISION AND DRAINAGE PERIRECTAL ABSCESS Right 09/19/2013   Procedure: IRRIGATION AND DEBRIDEMENT RIGHT GLUTEAL AND LABIAL ABSCESSES;  Surgeon: Axel Filler, MD;  Location: [facility] OR;  Service: General;  Laterality: Right;  . INCISION AND DRAINAGE PERIRECTAL ABSCESS Right 09/24/2013   Procedure: IRRIGATION AND DEBRIDEMENT PERIRECTAL ABSCESS;  Surgeon: Cherylynn Ridges, MD;  Location: [facility] OR;  Service: General;  Laterality: Right;    Social History:  reports that she has never smoked. She has never used smokeless  tobacco. She reports previous alcohol use. She reports that she does not use drugs. Family History:  Family History  Problem Relation Age of Onset  . Asthma Mother   . Carpal tunnel syndrome Mother   . Gout Father   . Diabetes Paternal Grandmother   . Anesthesia problems Neg Hx      HOME MEDICATIONS: Allergies as of 06/12/2019      Reactions   Penicillins Hives, Rash   Has patient had a PCN reaction causing immediate rash, facial/tongue/throat swelling, SOB or lightheadedness with hypotension: Yes Has patient had a PCN reaction causing severe rash involving mucus membranes or skin necrosis: No Has patient had a PCN reaction that required hospitalization: Yes Has patient had a PCN reaction occurring within the last 10 years: No If all of the above answers are "NO", then may proceed with Cephalosporin use.   Benadryl [diphenhydramine] Itching   Doxycycline Itching      Medication List       Accurate as of June 12, 2019  4:08 PM. If you have any questions, ask your nurse or doctor.        aspirin EC 81 MG tablet Take 1 tablet (81 mg total) by mouth daily. Take after 12 weeks for prevention of preeclampsia later in pregnancy   blood glucose meter kit and supplies Kit Dispense based on patient and insurance preference. Use up to four times daily as directed. (FOR ICD-9 250.00, 250.01).   Blood Pressure Kit Devi 1 Device by Does not apply route as needed.   cephALEXin 500 MG capsule Commonly known as: KEFLEX Take 1 capsule (500 mg total) by mouth 4 (four) times daily.   Dexcom G6 Receiver Devi 1 Device by Does not apply route as directed.   Dexcom G6 Sensor Misc 1 Device by Does not apply route as directed.   Dexcom G6 Transmitter Misc 1 Device by Does not apply route as directed.   famotidine 20 MG tablet Commonly known as: PEPCID Take 1 tablet (20 mg total) by mouth 2 (two) times daily.   Fusion Plus Caps Take 1 tablet by mouth daily.   Glucagon HCl 1 MG  Solr Inject 1 mg as directed as directed.   insulin aspart cartridge Commonly known as: NOVOLOG Inject 6 Units into the skin daily with breakfast AND 6 Units daily with lunch AND 8 Units daily with supper.   Insulin Glargine 100 UNIT/ML Solostar Pen Commonly known as: LANTUS Inject 20 Units into the skin daily.   insulin lispro 100 UNIT/ML cartridge Commonly known as: HUMALOG Inject 0.06 mLs (6 Units total) into the skin daily with breakfast AND 0.06 mLs (6 Units total) daily with lunch AND 0.08 mLs (8 Units total) daily with supper.   loperamide 2 MG tablet Commonly known as: Imodium A-D Take 1 tablet (2 mg total) by mouth 4 (four) times daily as needed for diarrhea or loose stools.   ondansetron  4 MG disintegrating tablet Commonly known as: Zofran ODT Take 1 tablet (4 mg total) by mouth every 6 (six) hours as needed for nausea.   PRENATAL ADULT GUMMY/DHA/FA PO Take by mouth.   promethazine 25 MG tablet Commonly known as: PHENERGAN Take 1 tablet (25 mg total) by mouth every 6 (six) hours as needed for nausea or vomiting.        OBJECTIVE:    DM foot exam: 04/08/2019 The skin of the feet is intact without sores or ulcerations. The pedal pulses are 2+ on right and 2+ on left. The sensation is intact to a screening 5.07, 10 gram monofilament bilaterally     DATA REVIEWED:  Lab Results  Component Value Date   HGBA1C 7.8 (H) 05/20/2019   HGBA1C 8.3 (A) 04/08/2019   HGBA1C 16.8 (H) 10/24/2018    Lab Results  Component Value Date   MICRALBCREAT 115.3 (H) 04/08/2019    Results for RUSTIE, GILL (MRN [id number]) as of 04/08/2019 16:35  Ref. Range 04/08/2019 10:20  MICROALB/CREAT RATIO Latest Ref Range: 0.0 - 30.0 mg/g 115.3 (H)    ASSESSMENT / PLAN / RECOMMENDATIONS:   1) Type 1 Diabetes Mellitus, Poorly controlled, With microalbuminuria, During Second trimester - Most recent A1c of 7.8 %. Goal A1c < 6.5%.    -Patient with improved glycemic control, but  has had frequent DKA's during this pregnancy.  Today we had a long discussion about the importance of glycemic control during pregnancy and how acidosis can be harmful to the mother and baby, we discussed that acidosis is due to hyperglycemia and underdosing of prandial insulin, acidosis could also be caused due to lack of basal insulin but the patient is compliant with her basal insulin -She has severe fear of hypoglycemia and tends to continuously keep glucose close to 200. -Today we reviewed her Dexcom downloads as well as her implant downloads, this morning for example she stated that she had a low BG, upon further questioning her BG was 90 mg/dL (which I explained to her is at goal for pregnancy) the patient drank orange soda with repeat BG in the office is 219 mg/dL .  We use this as an educational moment, we also reviewed the daily trends over the past 2 weeks, the patient came to realize that despite a small amount of sugar sweetened beverages her sugars have been going up to 200s+ -We discussed the importance of getting into a compromise where she is not having hypoglycemic episode but also she is not underdosing her prandial insulin without a good reason. -In review of the Ambien download the patient has mainly been using it from correction rather than giving herself a set dose of breakfast lunch dinner.  I will reduce her prandial dose so she is not scared of giving herself the 2nd dose of prandial, I am also I am also going to adjust her sensitivity factor as below -We did also discuss an insulin pump as an option Per OB recommendation, I explained to the patient that she will need to enter the amount of carbohydrates she eats to receive appropriate amount of prandial insulin otherwise she is still at risk for DKA due to insufficient insulin replacement.  Patient opted to continue on the Dexcom and the) regimen at this time. -Repeat BMP today shows a low bicarb but her anion gap is  9.  MEDICATIONS:  Decrease Lantus 18 units ONCE daily   Humalog 5 units 3 times daily before every meal   CF:  Novolog (BG-100/60)   EDUCATION / INSTRUCTIONS:  BG monitoring instructions: Patient is instructed to check her blood sugars 7 times a day, before meals, 2 hr post-prandial and bedtime.  Call [facility] if: BG persistently < 70 or > 300. . I reviewed the Rule of 15 for the treatment of hypoglycemia in detail with the patient. Literature supplied.     F/U in 2 weeks  30 minutes were spent with the patient and reviewing the above   Addendum: discussed BMP with the pt on 06/12/2019 at 1630 . Pt advised that Bicarb is low and despite a normal AG , she is at risk for going to DKA. She was advised to check glucose every 3 hours, until her glucose is consistently below 150 mg/dL.  She was again encouraged to bolus for every meal and not just to give herself a correction factor.  Signed electronically by: Lyndle Herrlich, MD  [facility] Endocr[facility]  [facility] [address], [address] [place], [place] [address] Phone: [phone] FAX: [phone]   CC: Ashley Register, MD [address] [place] [address] Phone: [phone]  Fax: [phone]  Return to Endocrinology clinic as below: Future Appointments  Date Time Provider Department Center  06/17/2019  9:00 AM WH-MFC GENETIC COUNSELING RM WH-MFC MFC-US  06/17/2019  3:55 PM Anyanwu, Jethro Bastos, MD WOC-WOCA WOC  06/26/2019 10:50 AM Erico Stan, Konrad Dolores, MD LBPC-LBENDO None  07/10/2019  9:00 AM WH-MFC NURSE WH-MFC MFC-US  07/10/2019  9:00 AM WH-MFC [place] 3 WH-MFCUS MFC-US  07/15/2019 11:15 AM Anyanwu, Jethro Bastos, MD [facility] WOC

## 2019-06-15 ENCOUNTER — Other Ambulatory Visit: Payer: Self-pay | Admitting: General Practice

## 2019-06-15 MED ORDER — IRON 325 (65 FE) MG PO TABS: 325.0000 mg | ORAL_TABLET | Freq: Every day | ORAL | 1 refills | Status: DC

## 2019-06-16 ENCOUNTER — Telehealth: Payer: Self-pay

## 2019-06-16 ENCOUNTER — Telehealth (HOSPITAL_COMMUNITY): Payer: Self-pay | Admitting: Genetic Counselor

## 2019-06-16 NOTE — Telephone Encounter (Signed)
Call received from [employer] this AM at 0810 reporting BP of 122/93 on 06/15/19.   Called pt at 1107; pt's BP is 142/97 at this time. Pt denies any s/s of htn and reports she was just very upset. Explained to pt I can call back in 30 minutes to recheck BP, pt agreeable to plan.   Called pt at 1140; BP is 120/82, HR 111 at this time. Encouraged pt to continue checking BP weekly and anytime she experiences headache, blurry vision, or dizziness.

## 2019-06-16 NOTE — Telephone Encounter (Signed)
I called Ashley Freeman as she was scheduled to see me for a genetic counseling consultation tomorrow morning (2/17). Ashley Freeman was referred for genetic counseling as her Panorama noninvasive prenatal screening (NIPS) result demonstrated a 1 in 5 (~6%) risk for triploidy, trisomy 29, or trisomy 38 in the current pregnancy due to an insufficient fetal fraction of 2.9%. Ashley Freeman elected to have a sample redrawn for Panorama NIPS with her OBGYN provider on 2/2. This new sample had a sufficient fetal fraction of 3.4% and was low-risk for fetal aneuploidies. These results showed a less than 1 in 10,000 risk for trisomies 21, 18 and 13, and monosomy X (Turner syndrome).  In addition, the risk for triploidy and sex chromosome trisomies (47,XXX and 47,XXY) was also low. Ashley Freeman elected to have analysis for 22q11.2 deletion syndrome, which was also low risk (1 in 2900). While this testing is not diagnostic, it significantly decreases the risk for fetal aneuploidies including triploidy, trisomy 17, and trisomy 52 in the current fetus. Ashley Freeman was reassured by this low-risk result and opted to cancel her genetic counseling appointment for tomorrow. I provided her with my contact information and encouraged her to contact me if she ever has any future questions or concerns. She confirmed that she had no questions at this time.  Buelah Manis, MS Genetic Counselor

## 2019-06-17 ENCOUNTER — Other Ambulatory Visit: Payer: Self-pay

## 2019-06-17 ENCOUNTER — Ambulatory Visit (HOSPITAL_COMMUNITY): Payer: BLUE CROSS/BLUE SHIELD

## 2019-06-17 ENCOUNTER — Telehealth (INDEPENDENT_AMBULATORY_CARE_PROVIDER_SITE_OTHER): Payer: BLUE CROSS/BLUE SHIELD | Admitting: Family Medicine

## 2019-06-17 VITALS — BP 125/86

## 2019-06-17 DIAGNOSIS — Z3A16 16 weeks gestation of pregnancy: Secondary | ICD-10-CM

## 2019-06-17 DIAGNOSIS — O24012 Pre-existing diabetes mellitus, type 1, in pregnancy, second trimester: Secondary | ICD-10-CM

## 2019-06-17 DIAGNOSIS — O2341 Unspecified infection of urinary tract in pregnancy, first trimester: Secondary | ICD-10-CM

## 2019-06-17 DIAGNOSIS — O99019 Anemia complicating pregnancy, unspecified trimester: Secondary | ICD-10-CM

## 2019-06-17 DIAGNOSIS — E1065 Type 1 diabetes mellitus with hyperglycemia: Secondary | ICD-10-CM

## 2019-06-17 DIAGNOSIS — B951 Streptococcus, group B, as the cause of diseases classified elsewhere: Secondary | ICD-10-CM

## 2019-06-17 DIAGNOSIS — O0992 Supervision of high risk pregnancy, unspecified, second trimester: Secondary | ICD-10-CM

## 2019-06-17 DIAGNOSIS — O24011 Pre-existing diabetes mellitus, type 1, in pregnancy, first trimester: Secondary | ICD-10-CM

## 2019-06-17 DIAGNOSIS — O2342 Unspecified infection of urinary tract in pregnancy, second trimester: Secondary | ICD-10-CM

## 2019-06-17 DIAGNOSIS — O99012 Anemia complicating pregnancy, second trimester: Secondary | ICD-10-CM

## 2019-06-17 DIAGNOSIS — O0990 Supervision of high risk pregnancy, unspecified, unspecified trimester: Secondary | ICD-10-CM

## 2019-06-17 MED ORDER — IRON 325 (65 FE) MG PO TABS: 325.0000 mg | ORAL_TABLET | ORAL | 1 refills | Status: DC

## 2019-06-17 NOTE — Progress Notes (Addendum)
TELEHEALTH VIRTUAL OBSTETRICS VISIT ENCOUNTER NOTE  I connected with Ashley Freeman on 06/17/19 at  3:55 PM EST by telephone at home and verified that I am speaking with the correct person using two identifiers.   I discussed the limitations, risks, security and privacy concerns of performing an evaluation and management service by telephone and the availability of in person appointments. I also discussed with the patient that there may be a patient responsible charge related to this service. The patient expressed understanding and agreed to proceed.  Subjective:  Ashley Freeman is a 29 y.o. G2P0010 at [redacted]w[redacted]d being followed for ongoing prenatal care.  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  Patient reports no complaints. Reports fetal movement. Denies any contractions, bleeding or leaking of fluid.   The following portions of the patient's history were reviewed and updated as appropriate: allergies, current medications, past family history, past medical history, past social history, past surgical history and problem list.   Objective:   Vitals:   06/17/19 1639 06/17/19 1658  BP: (!) 149/98 125/86   BP Readings from Last 4 Encounters:  06/17/19 125/86  06/12/19 114/68  05/20/19 119/76  05/12/19 118/78   General:  Alert, oriented and cooperative.   Mental Status: Normal mood and affect perceived. Normal judgment and thought content.    Rest of physical exam deferred due to type of encounter  Assessment and Plan:  Pregnancy: G2P0010 at [redacted]w[redacted]d 1. Supervision of high risk pregnancy, antepartum - T1DM; see management below - RTC in 4 weeks; in-person (patient requests [place] or [facility] if possible-discussed nature of practice and how this could be unlikely but will try)  - Mild anemia of pregnancy; PO iron q48h  2. Pre-existing type 1 diabetes mellitus with hyperglycemia during pregnancy in first trimester [facility]) - Managed by Endo; patient has visits every 2 weeks; reports insulin levels recently decreased due to two seizures early Jan due to hypoglycemia  - Patient has Dexcom and the In-pen; pump was discussed but patient and Endo decided against this - Hgb A1c 7.8 - BMP with normalized Cr and glucose 206 on 2/12 - Fetal echo ordered; Pam will send message to coordinate  - Proteinuria present, TSH WNL - Discussed DKA; patient has been in DKA outside of pregnancy; of course would like to minimize DKA episodes during pregnancy  - Glucose values have been running around 160's lately   3. GBS (group b Streptococcus) UTI complicating pregnancy, first trimester - s/p treatment with Keflex; Ur Cx 1/20 with no growht - needs intrapartum ppx   Preterm labor symptoms and general obstetric precautions including but not limited to vaginal bleeding, contractions, leaking of fluid and fetal movement were reviewed in detail with the patient.  I discussed the assessment and treatment plan with the patient. The patient was provided an opportunity to ask questions and all were answered. The patient agreed with the plan and demonstrated an understanding of the instructions. The patient was advised to call back or seek an in-person office evaluation/go to [facility] at [facility] for any urgent or concerning  symptoms. Please refer to After Visit Summary for other counseling recommendations.   I provided 20 minutes of non-face-to-face  time during this encounter.  Return in about 4 weeks (around 07/15/2019) for [facility]; in-person per patient request .  Future Appointments  Date Time Provider [place]  06/18/2019  1:50 PM [facility]CHWW [facility] CHW-CHWW None  06/26/2019 10:50 AM Shamleffer, Melanie Crazier, MD [facility]-LBENDO None  07/10/2019  9:00 AM WH-MFC NURSE WH-MFC MFC-US  07/10/2019  9:00 AM WH-MFC [place] 3 WH-MFCUS MFC-US  07/14/2019 10:30 AM Aletha Halim, MD [facility]  07/15/2019  2:15 PM Aletha Halim, MD [place], MD Center for [facility] [place]

## 2019-06-17 NOTE — Progress Notes (Signed)
4:15p- Called pt for My Chart visit, no answer, left VM that will retry in 10 to 15 minutes.   4:32P:  I connected with  Mirta Springfield-Baldwin on 06/17/19 at  3:55 PM EST by telephone and verified that I am speaking with the correct person using two identifiers.   I discussed the limitations, risks, security and privacy concerns of performing an evaluation and management service by telephone and the availability of in person appointments. I also discussed with the patient that there may be a patient responsible charge related to this service. The patient expressed understanding and agreed to proceed.  Bethanne Ginger, CMA 06/17/2019  4:32 PM

## 2019-06-18 ENCOUNTER — Other Ambulatory Visit: Payer: Self-pay

## 2019-06-18 ENCOUNTER — Ambulatory Visit: Payer: BLUE CROSS/BLUE SHIELD | Attending: Family Medicine | Admitting: Physician Assistant

## 2019-06-18 DIAGNOSIS — R21 Rash and other nonspecific skin eruption: Secondary | ICD-10-CM | POA: Diagnosis not present

## 2019-06-18 DIAGNOSIS — E108 Type 1 diabetes mellitus with unspecified complications: Secondary | ICD-10-CM

## 2019-06-18 DIAGNOSIS — E1065 Type 1 diabetes mellitus with hyperglycemia: Secondary | ICD-10-CM | POA: Diagnosis not present

## 2019-06-18 DIAGNOSIS — IMO0002 Reserved for concepts with insufficient information to code with codable children: Secondary | ICD-10-CM

## 2019-06-18 DIAGNOSIS — Z3A16 16 weeks gestation of pregnancy: Secondary | ICD-10-CM | POA: Diagnosis not present

## 2019-06-18 MED ORDER — CETIRIZINE HCL 10 MG PO TABS: 10.0000 mg | ORAL_TABLET | Freq: Every day | ORAL | 11 refills | Status: DC

## 2019-06-18 MED ORDER — TRIAMCINOLONE ACETONIDE 0.1 % EX CREA: 1.0000 "application " | TOPICAL_CREAM | Freq: Two times a day (BID) | CUTANEOUS | 0 refills | Status: DC

## 2019-06-18 NOTE — Progress Notes (Signed)
Virtual Visit via Telephone Note  I connected with [name] on 06/18/19 at  1:50 PM EST by telephone and verified that I am speaking with the correct person using two identifiers.   I discussed the limitations, risks, security and privacy concerns of performing an evaluation and management service by telephone and the availability of in person appointments. I also discussed with the patient that there may be a patient responsible charge related to this service. The patient expressed understanding and agreed to proceed.  PATIENT visit by telephone virtually in the context of Covid-19 pandemic. Patient location:  home My Location:  [place] office Persons on the call:  Me and the patient     History of Present Illness: Patient is [redacted] weeks pregnant and so far all is going well with her pregnancy.  She is having a rash on the inside of her arm and her lower legs that itches slightly.  No new soaps or detergents.  She finished a course of cephalexin about 2 week s ago.  Blood sugars Freeman 90-110.  No hypoglycemia.  no fevers  Observations/Objective:  NAD.  A&Ox3   Assessment and Plan: 1. Rash Unsure etiology-f/up ob/gyn next week(sees Dr Steele Berg) - cetirizine (ZYRTEC) 10 MG tablet; Take 1 tablet (10 mg total) by mouth daily.  Dispense: 30 tablet; Refill: 11 - triamcinolone cream (KENALOG) 0.1 %; Apply 1 application topically 2 (two) times daily. Use sparingly prn itching  Dispense: 30 g; Refill: 0  2. Diabetes mellitus type 1, uncontrolled, with complications (HCC) Good numbers currently-continue current regimen  3. [redacted] weeks gestation of pregnancy Keep f/up with OB/gyn    Follow Up Instructions: See PCP 4-6 weeks   I discussed the assessment and treatment plan with the patient. The patient was provided an opportunity to ask questions and all were answered. The patient agreed with the plan and demonstrated an understanding of the instructions.   The patient was advised  to call back or seek an in-person evaluation if the symptoms worsen or if the condition fails to improve as anticipated.  I provided 12 minutes of non-face-to-face time during this encounter.   [name], PA-C  Patient ID: Ashley Freeman, female   DOB: [birth year], 29 y.o.   MRN: [id number]

## 2019-06-22 ENCOUNTER — Emergency Department (HOSPITAL_COMMUNITY)
Admission: EM | Admit: 2019-06-22 | Discharge: 2019-06-22 | Disposition: A | Discharge status: Home / Self Care | Payer: BLUE CROSS/BLUE SHIELD | Attending: Emergency Medicine | Admitting: Emergency Medicine

## 2019-06-22 ENCOUNTER — Encounter: Payer: Self-pay | Admitting: *Deleted

## 2019-06-22 ENCOUNTER — Encounter (HOSPITAL_COMMUNITY): Payer: Self-pay | Admitting: Emergency Medicine

## 2019-06-22 ENCOUNTER — Other Ambulatory Visit: Payer: Self-pay

## 2019-06-22 DIAGNOSIS — Z79899 Other long term (current) drug therapy: Secondary | ICD-10-CM | POA: Diagnosis not present

## 2019-06-22 DIAGNOSIS — E109 Type 1 diabetes mellitus without complications: Secondary | ICD-10-CM

## 2019-06-22 DIAGNOSIS — O219 Vomiting of pregnancy, unspecified: Secondary | ICD-10-CM | POA: Diagnosis present

## 2019-06-22 DIAGNOSIS — O24012 Pre-existing diabetes mellitus, type 1, in pregnancy, second trimester: Secondary | ICD-10-CM | POA: Diagnosis not present

## 2019-06-22 DIAGNOSIS — Z3A17 17 weeks gestation of pregnancy: Secondary | ICD-10-CM

## 2019-06-22 LAB — CBC
HCT: 29 % — ABNORMAL LOW (ref 36.0–46.0)
Hemoglobin: 9.8 g/dL — ABNORMAL LOW (ref 12.0–15.0)
MCH: 28.7 pg (ref 26.0–34.0)
MCHC: 33.8 g/dL (ref 30.0–36.0)
MCV: 84.8 fL (ref 80.0–100.0)
Platelets: 298 10*3/uL (ref 150–400)
RBC: 3.42 MIL/uL — ABNORMAL LOW (ref 3.87–5.11)
RDW: 14.4 % (ref 11.5–15.5)
WBC: 7.9 10*3/uL (ref 4.0–10.5)
nRBC: 0 % (ref 0.0–0.2)

## 2019-06-22 LAB — URINALYSIS, ROUTINE W REFLEX MICROSCOPIC
Bilirubin Urine: NEGATIVE
Glucose, UA: NEGATIVE mg/dL
Hgb urine dipstick: NEGATIVE
Ketones, ur: NEGATIVE mg/dL
Leukocytes,Ua: NEGATIVE
Nitrite: NEGATIVE
Protein, ur: 100 mg/dL — AB
Specific Gravity, Urine: 1.011 (ref 1.005–1.030)
pH: 6 (ref 5.0–8.0)

## 2019-06-22 LAB — BLOOD GAS, VENOUS
Acid-base deficit: 2.9 mmol/L — ABNORMAL HIGH (ref 0.0–2.0)
Bicarbonate: 22.9 mmol/L (ref 20.0–28.0)
O2 Saturation: 49 %
Patient temperature: 98.6
pCO2, Ven: 47.3 mmHg (ref 44.0–60.0)
pH, Ven: 7.306 (ref 7.250–7.430)

## 2019-06-22 LAB — COMPREHENSIVE METABOLIC PANEL
ALT: 29 U/L (ref 0–44)
AST: 28 U/L (ref 15–41)
Albumin: 2.8 g/dL — ABNORMAL LOW (ref 3.5–5.0)
Alkaline Phosphatase: 51 U/L (ref 38–126)
Anion gap: 8 (ref 5–15)
BUN: 8 mg/dL (ref 6–20)
CO2: 20 mmol/L — ABNORMAL LOW (ref 22–32)
Calcium: 8.8 mg/dL — ABNORMAL LOW (ref 8.9–10.3)
Chloride: 108 mmol/L (ref 98–111)
Creatinine, Ser: 0.93 mg/dL (ref 0.44–1.00)
GFR calc Af Amer: >60 mL/min (ref >60)
GFR calc non Af Amer: >60 mL/min (ref >60)
Glucose, Bld: 240 mg/dL — ABNORMAL HIGH (ref 70–99)
Potassium: 3.9 mmol/L (ref 3.5–5.1)
Sodium: 136 mmol/L (ref 135–145)
Total Bilirubin: 0.2 mg/dL — ABNORMAL LOW (ref 0.3–1.2)
Total Protein: 6.5 g/dL (ref 6.5–8.1)

## 2019-06-22 LAB — CBG MONITORING, ED: Glucose-Capillary: 202 mg/dL — ABNORMAL HIGH (ref 70–99)

## 2019-06-22 LAB — HCG, QUANTITATIVE, PREGNANCY: hCG, Beta Chain, Quant, S: 25501 m[IU]/mL — ABNORMAL HIGH (ref <5)

## 2019-06-22 LAB — LIPASE, BLOOD: Lipase: 16 U/L (ref 11–51)

## 2019-06-22 MED ORDER — SODIUM CHLORIDE 0.9 % IV BOLUS
1000.0000 mL | Freq: Once | INTRAVENOUS | Status: AC
  Administered 2019-06-22: 1000 mL via INTRAVENOUS

## 2019-06-22 NOTE — ED Triage Notes (Addendum)
Pt reports that she is diabetic and is pregnant. Reports yesterday had vomiting but just nausea today. Her doctor told her that if she continued to vomit that she could in DKA. Pt wanted to be checked out to make sure she isnt. Last sugar was 178.

## 2019-06-22 NOTE — Addendum Note (Signed)
Addended by: Bethanne Ginger on: 06/22/2019 04:41 PM   Modules accepted: Orders

## 2019-06-22 NOTE — ED Provider Notes (Signed)
[facility] DE[facility] Provider Note   CSN: [id number] Arrival date & time: 06/22/19  1625     History Chief Complaint  Patient presents with  . Nausea  . Emesis During Pregnancy  . Blood Sugar Problem    Ashley Freeman is a 29 y.o. female.  Presents to emergency department with concern for elevated sugars, nausea.  Over the past few days has had intermittent episodes of nausea, has had nausea intermittently throughout pregnancy.  Yesterday had 1 episode of vomiting, no vomiting today.  Sugars have been running high but not higher than 300.  Mostly 100s to 200s.  No vaginal bleeding, no vaginal discharge, no gush of fluids.  No abdominal pain no lower abdomen, no pelvic cramping.  [redacted] weeks pregnant, followed by OB and internal medicine closely in outpatient.  HPI     Past Medical History:  Diagnosis Date  . Abscess, gluteal, right 08/24/2013  . AKI (acute kidney injury) ([place]) 07/26/2014  . Anemia 02/19/2012  . Bartholin's gland abscess 09/19/2013  . BV (bacterial vaginosis) 11/24/2015  . Diabetes mellitus 2001   Diagnosed at age 29 ; Type I  . Diarrhea 05/30/2016  . DKA (diabetic ketoacidoses) ([place]) 08/19/2013  . DKA (diabetic ketoacidoses) ([place]) 05/30/2016  . Gonorrhea 08/2011   Treated in 09/2011  . History of trichomoniasis 05/31/2016  . Hyperlipidemia 03/28/2016  . Sepsis ([place]) 09/19/2013    Patient Active Problem List   Diagnosis Date Noted  . Proteinuria due to type 1 diabetes mellitus ([place]) 05/21/2019  . Proteinuria complicating pregnancy 07/86/7544  . Supervision of high risk pregnancy, antepartum 05/06/2019  . Pre-existing type 1 diabetes mellitus during pregnancy in first trimester ([place]) 05/06/2019  . Diabetic neuropathy ([place]) 01/16/2016  . GBS (group b Streptococcus) UTI complicating pregnancy, first trimester 04/08/2014  . Diabetic ketoacidosis without coma associated with type 1 diabetes mellitus ([place]) 09/19/2013  . Diabetes  mellitus type 1, uncontrolled, with complications ([place]) 92/04/69    Past Surgical History:  Procedure Laterality Date  . INCISION AND DRAINAGE PERIRECTAL ABSCESS Right 08/18/2013   Procedure: IRRIGATION AND DEBRIDEMENT GLUTEAL ABSCESS;  Surgeon: Ralene Ok, MD;  Location: [facility];  Service: General;  Laterality: Right;  . INCISION AND DRAINAGE PERIRECTAL ABSCESS Right 09/19/2013   Procedure: IRRIGATION AND DEBRIDEMENT RIGHT GLUTEAL AND LABIAL ABSCESSES;  Surgeon: Ralene Ok, MD;  Location: [facility];  Service: General;  Laterality: Right;  . INCISION AND DRAINAGE PERIRECTAL ABSCESS Right 09/24/2013   Procedure: IRRIGATION AND DEBRIDEMENT PERIRECTAL ABSCESS;  Surgeon: Gwenyth Ober, MD;  Location: [facility];  Service: General;  Laterality: Right;     OB History    Gravida  2   Para  0   Term      Preterm      AB  1   Living  0     SAB  1   TAB      Ectopic      Multiple      Live Births              Family History  Problem Relation Age of Onset  . Asthma Mother   . Carpal tunnel syndrome Mother   . Gout Father   . Diabetes Paternal Grandmother   . Anesthesia problems Neg Hx     Social History   Tobacco Use  . Smoking status: Never Smoker  . Smokeless tobacco: Never Used  Substance Use Topics  . Alcohol use: Not Currently  . Drug use: No  Home Medications Prior to Admission medications   Medication Sig Start Date End Date Taking? Authorizing Provider  aspirin EC 81 MG tablet Take 1 tablet (81 mg total) by mouth daily. Take after 12 weeks for prevention of preeclampsia later in pregnancy 05/20/19  Yes Anyanwu, Sallyanne Havers, MD  cetirizine (ZYRTEC) 10 MG tablet Take 1 tablet (10 mg total) by mouth daily. 06/18/19  Yes Argentina Donovan, PA-C  Glucagon HCl 1 MG SOLR Inject 1 mg as directed as directed. 05/12/19  Yes Shamleffer, Melanie Crazier, MD  insulin aspart (NOVOLOG) cartridge Inject 6 Units into the skin daily with breakfast AND 6 Units daily with lunch  AND 8 Units daily with supper. 05/13/19  Yes Shamleffer, Melanie Crazier, MD  Insulin Glargine (LANTUS) 100 UNIT/ML Solostar Pen Inject 20 Units into the skin daily. Patient taking differently: Inject 18 Units into the skin daily.  06/12/19  Yes Shamleffer, Melanie Crazier, MD  loperamide (IMODIUM A-D) 2 MG tablet Take 1 tablet (2 mg total) by mouth 4 (four) times daily as needed for diarrhea or loose stools. 04/01/19  Yes Rasch, Artist Pais, NP  Prenatal MV & Min w/FA-DHA (PRENATAL ADULT GUMMY/DHA/FA PO) Take 1 tablet by mouth.    Yes [provider]  blood glucose meter kit and supplies KIT Dispense based on patient and insurance preference. Use up to four times daily as directed. (FOR ICD-9 250.00, 250.01). 05/25/17   Isla Pence, MD  Blood Pressure Monitoring (BLOOD PRESSURE KIT) DEVI 1 Device by Does not apply route as needed. 05/06/19   Anyanwu, Sallyanne Havers, MD  Continuous Blood Gluc Receiver (DEXCOM G6 RECEIVER) DEVI 1 Device by Does not apply route as directed. 11/27/18   Shamleffer, Melanie Crazier, MD  Continuous Blood Gluc Sensor (DEXCOM G6 SENSOR) MISC 1 Device by Does not apply route as directed. 11/27/18   Shamleffer, Melanie Crazier, MD  Continuous Blood Gluc Transmit (DEXCOM G6 TRANSMITTER) MISC 1 Device by Does not apply route as directed. 11/27/18   Shamleffer, Melanie Crazier, MD  famotidine (PEPCID) 20 MG tablet Take 1 tablet (20 mg total) by mouth 2 (two) times daily. Patient not taking: Reported on 06/17/2019 03/26/19   Elvera Maria, CNM  Ferrous Sulfate (IRON) 325 (65 Fe) MG TABS Take 1 tablet (325 mg total) by mouth every other day. 06/17/19   Fair, Marin Shutter, MD  insulin lispro (HUMALOG) 100 UNIT/ML cartridge Inject 0.06 mLs (6 Units total) into the skin daily with breakfast AND 0.06 mLs (6 Units total) daily with lunch AND 0.08 mLs (8 Units total) daily with supper. Patient not taking: Reported on 06/10/2019 05/12/19   Shamleffer, Melanie Crazier, MD  ondansetron  (ZOFRAN ODT) 4 MG disintegrating tablet Take 1 tablet (4 mg total) by mouth every 6 (six) hours as needed for nausea. Patient not taking: Reported on 06/17/2019 05/20/19   Osborne Oman, MD  promethazine (PHENERGAN) 25 MG tablet Take 1 tablet (25 mg total) by mouth every 6 (six) hours as needed for nausea or vomiting. Patient not taking: Reported on 06/17/2019 05/20/19   Osborne Oman, MD  triamcinolone cream (KENALOG) 0.1 % Apply 1 application topically 2 (two) times daily. Use sparingly prn itching 06/18/19   Argentina Donovan, PA-C    Allergies    Penicillins, Benadryl [diphenhydramine], and Doxycycline  Review of Systems   Review of Systems  Constitutional: Negative for chills and fever.  HENT: Negative for ear pain and sore throat.   Eyes: Negative for pain and visual disturbance.  Respiratory: Negative for cough and shortness of breath.   Cardiovascular: Negative for chest pain and palpitations.  Gastrointestinal: Positive for nausea and vomiting. Negative for abdominal pain.  Genitourinary: Negative for dysuria and hematuria.  Musculoskeletal: Negative for arthralgias and back pain.  Skin: Negative for color change and rash.  Neurological: Negative for seizures and syncope.  All other systems reviewed and are negative.   Physical Exam Updated Vital Signs BP 129/82 (BP Location: Left Leg)   Pulse (!) 105   Temp 98.6 F (37 C) (Oral)   Resp 18   Wt 62.1 kg   LMP  (LMP Unknown)   SpO2 98%   BMI 24.23 kg/m   Physical Exam Vitals and nursing note reviewed.  Constitutional:      General: She is not in acute distress.    Appearance: She is well-developed.  HENT:     Head: Normocephalic and atraumatic.  Eyes:     Conjunctiva/sclera: Conjunctivae normal.  Cardiovascular:     Rate and Rhythm: Normal rate and regular rhythm.     Heart sounds: No murmur.  Pulmonary:     Effort: Pulmonary effort is normal. No respiratory distress.     Breath sounds: Normal breath  sounds.  Abdominal:     Palpations: Abdomen is soft.     Tenderness: There is no abdominal tenderness.  Musculoskeletal:        General: No deformity or signs of injury.     Cervical back: Neck supple.  Skin:    General: Skin is warm and dry.     Capillary Refill: Capillary refill takes less than 2 seconds.  Neurological:     General: No focal deficit present.     Mental Status: She is alert and oriented to person, place, and time.  Psychiatric:        Mood and Affect: Mood normal.        Behavior: Behavior normal.     ED Results / Procedures / Treatments   Labs (all labs ordered are listed, but only abnormal results are displayed) Labs Reviewed  COMPREHENSIVE METABOLIC PANEL - Abnormal; Notable for the following components:      Result Value   CO2 20 (*)    Glucose, Bld 240 (*)    Calcium 8.8 (*)    Albumin 2.8 (*)    Total Bilirubin 0.2 (*)    All other components within normal limits  CBC - Abnormal; Notable for the following components:   RBC 3.42 (*)    Hemoglobin 9.8 (*)    HCT 29.0 (*)    All other components within normal limits  URINALYSIS, ROUTINE W REFLEX MICROSCOPIC - Abnormal; Notable for the following components:   Protein, ur 100 (*)    Bacteria, UA RARE (*)    All other components within normal limits  HCG, QUANTITATIVE, PREGNANCY - Abnormal; Notable for the following components:   hCG, Beta Chain, Quant, S 25,501 (*)    All other components within normal limits  BLOOD GAS, VENOUS - Abnormal; Notable for the following components:   Acid-base deficit 2.9 (*)    All other components within normal limits  CBG MONITORING, ED - Abnormal; Notable for the following components:   Glucose-Capillary 202 (*)    All other components within normal limits  LIPASE, BLOOD    EKG None  Radiology No results found.  Procedures Ultrasound ED OB Pelvic  Date/Time: 06/23/2019 1:06 AM Performed by: Lucrezia Starch, MD Authorized by: Lucrezia Starch, MD  Procedure details:    Indications: pregnant with abdominal pain     Assess:  Intrauterine pregnancy and fetal viability   Technique:  Transabdominal obstetric (HCG+) exam   Images: archived    Uterine findings:    Intrauterine pregnancy: identified     Single gestation: identified     Fetal heart rate: identified (154 bpm)      Left ovary findings:    Adnexal mass: not identified   Cysts: not identified   Right ovary findings:     Adnexal mass: not identified   Cysts: not identified   Comments:     Active fetal movement, single intrauterine pregnancy, fetal heart tones 154 bpm   (including critical care time)  Medications Ordered in ED Medications  sodium chloride 0.9 % bolus 1,000 mL (0 mLs Intravenous Stopped 06/22/19 1931)    ED Course  I have reviewed the triage vital signs and the nursing notes.  Pertinent labs & imaging results that were available during my care of the patient were reviewed by me and considered in my medical decision making (see chart for details).    MDM Rules/Calculators/A&P                      29 year old female with DM G2P1 at 17 weeks presented to ER with nausea, elevated sugars.  Here, patient very well-appearing, no ongoing symptoms, no ongoing vomiting.  Abdomen entirely soft.  Blood work confirmed patient is not in DKA.  Brief bedside ultrasound confirms active fetal movement, fetal heart tones 154bpm. Recommend close follow up with primary doctor, OBGYN regarding OB management and DM management. Reviewed return precautions, will dc home.     After the discussed management above, the patient was determined to be safe for discharge.  The patient was in agreement with this plan and all questions regarding their care were answered.  ED return precautions were discussed and the patient will return to the ED with any significant worsening of condition.   Final Clinical Impression(s) / ED Diagnoses Final diagnoses:  [redacted] weeks gestation of  pregnancy  Type 1 diabetes mellitus without complication [facility])    Rx / DC Orders ED Discharge Orders    None       Lucrezia Starch, MD 06/23/19 [phone]

## 2019-06-22 NOTE — Discharge Instructions (Signed)
Recommend following up with both your primary doctor and your OB/GYN.  If you have worsening nausea and vomiting, worsening elevated blood sugars, recommend return to ER for recheck.  If you develop vaginal bleeding, pelvic cramping, generalized abdominal pain, this also could be a reason to come back to the emergency department.

## 2019-06-22 NOTE — ED Notes (Signed)
First contact with patient. Pt is a/o and in no acute distress. Pt states she feels better after her fluid bolus.

## 2019-06-22 NOTE — Progress Notes (Unsigned)
Ambulatory Referral ordered for a Fetal Echo with Dr. Filbert Schilder, # [phone], Fax# [phone]. Drs Office wants Faxed; Pt name, referring providers name, which is Dr. Julieanne Manson last clinic notes, Pts demographics please.   Thanks,  Pam Anaika Santillano,CMA

## 2019-06-24 ENCOUNTER — Other Ambulatory Visit: Payer: Self-pay

## 2019-06-26 ENCOUNTER — Ambulatory Visit: Payer: BLUE CROSS/BLUE SHIELD | Admitting: Internal Medicine

## 2019-06-26 NOTE — Progress Notes (Deleted)
Name: Ashley Freeman  Age/ Sex: 29 y.o., female   MRN/ DOB: [id number], 04-24-1991     PCP: Hoy Register, MD   Reason for Endocrinology Evaluation: Type 2 Diabetes Mellitus  Initial Endocrine Consultative Visit: 06/27/18    PATIENT IDENTIFIER: [name] is a 29 y.o. female with a past medical history of T1DM with multiple DKA's. The patient has followed with Endocrinology clinic since 06/27/2018 for consultative assistance with management of her diabetes.  DIABETIC HISTORY:  [name] was diagnosed with T1DM at age 10. Hx of multiple DKA's due to non-compliance. Her hemoglobin A1c has ranged from  in 11.3% in 2011, peaking at 19.3% in 2015.   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  We decreased Lantus and continued Humalog.    Today (06/26/2019): [name] is here for a follow up on diabetes management during pregnancy. She is currently pregnant at 15.6 Weeks of gestation. She  checks her blood sugars 3-4 times daily, through the dexcom . The states she  has had hypoglycemic episodes since the last clinic visit, which typically occur in the morning.  Presented to the ED on 06/22/2019 with nausea and vomiting , Bicarb  low but improved from prior, BG 240 mg/dL , normal AG.   Las eye exam 12/12/2018    ROS: As per HPI and as detailed below: Review of Systems  Constitutional: Negative for chills and fever.  HENT: Negative for congestion and sore throat.   Cardiovascular: Negative for chest pain and palpitations.  Gastrointestinal: Negative for diarrhea and nausea.  Neurological: Negative for tingling and tremors.       HOME DIABETES REGIMEN:   Lantus 20  units daily  Novolog (Inpen) 6 units with Breakfast and lunch, 8 units with supper - does not take   CF : (BG-100/50)      CONTINUOUS GLUCOSE MONITORING RECORD INTERPRETATION    Dates of  Recording: 1/30-2/03/2020  Sensor description: Dexcom  Results statistics:   CGM use % of time 93  Average and SD 173/58  Time in range   58  %  % Time Above 180 32  % Time above 250 9  % Time Below target 1     Glycemic patterns summary: hyperglycemia during the day and night  Hyperglycemic episodes  Mainly postprandial   Hypoglycemic episodes occurred mainly during the day after a bolus  Overnight periods: steady  In-Pen report (14 day) :  Average glucose : 180 mg/dL Average and SD 696/29  Time in range   55  %  % Time Above 180 33  % Time above 250 12   Missed Doses : 38 doses  TTD 34.6 units             HISTORY:  Past Medical History:  Past Medical History:  Diagnosis Date  . Abscess, gluteal, right 08/24/2013  . AKI (acute kidney injury) (HCC) 07/26/2014  . Anemia 02/19/2012  . Bartholin's gland abscess 09/19/2013  . BV (bacterial vaginosis) 11/24/2015  . Diabetes mellitus 2001   Diagnosed at age 33 ; Type I  . Diarrhea 05/30/2016  . DKA (diabetic ketoacidoses) (HCC) 08/19/2013  . DKA (diabetic ketoacidoses) (HCC) 05/30/2016  . Gonorrhea 08/2011   Treated in 09/2011  . History of trichomoniasis 05/31/2016  . Hyperlipidemia 03/28/2016  . Sepsis (HCC) 09/19/2013   Past Surgical History:  Past Surgical History:  Procedure Laterality Date  . INCISION AND  DRAINAGE PERIRECTAL ABSCESS Right 08/18/2013   Procedure: IRRIGATION AND DEBRIDEMENT GLUTEAL ABSCESS;  Surgeon: Axel Filler, MD;  Location: [facility] OR;  Service: General;  Laterality: Right;  . INCISION AND DRAINAGE PERIRECTAL ABSCESS Right 09/19/2013   Procedure: IRRIGATION AND DEBRIDEMENT RIGHT GLUTEAL AND LABIAL ABSCESSES;  Surgeon: Axel Filler, MD;  Location: [facility] OR;  Service: General;  Laterality: Right;  . INCISION AND DRAINAGE PERIRECTAL ABSCESS Right 09/24/2013   Procedure: IRRIGATION AND DEBRIDEMENT PERIRECTAL ABSCESS;  Surgeon: Cherylynn Ridges, MD;  Location[facility] OR;  Service: General;  Laterality:  Right;    Social History:  reports that she has never smoked. She has never used smokeless tobacco. She reports previous alcohol use. She reports that she does not use drugs. Family History:  Family History  Problem Relation Age of Onset  . Asthma Mother   . Carpal tunnel syndrome Mother   . Gout Father   . Diabetes Paternal Grandmother   . Anesthesia problems Neg Hx      HOME MEDICATIONS: Allergies as of 06/26/2019      Reactions   Penicillins Hives, Rash   Has patient had a PCN reaction causing immediate rash, facial/tongue/throat swelling, SOB or lightheadedness with hypotension: Yes Has patient had a PCN reaction causing severe rash involving mucus membranes or skin necrosis: No Has patient had a PCN reaction that required hospitalization: Yes Has patient had a PCN reaction occurring within the last 10 years: No If all of the above answers are "NO", then may proceed with Cephalosporin use.   Benadryl [diphenhydramine] Itching   Doxycycline Itching      Medication List       Accurate as of June 26, 2019  7:56 AM. If you have any questions, ask your nurse or doctor.        aspirin EC 81 MG tablet Take 1 tablet (81 mg total) by mouth daily. Take after 12 weeks for prevention of preeclampsia later in pregnancy   blood glucose meter kit and supplies Kit Dispense based on patient and insurance preference. Use up to four times daily as directed. (FOR ICD-9 250.00, 250.01).   Blood Pressure Kit Devi 1 Device by Does not apply route as needed.   cetirizine 10 MG tablet Commonly known as: ZYRTEC Take 1 tablet (10 mg total) by mouth daily.   Dexcom G6 Receiver Devi 1 Device by Does not apply route as directed.   Dexcom G6 Sensor Misc 1 Device by Does not apply route as directed.   Dexcom G6 Transmitter Misc 1 Device by Does not apply route as directed.   famotidine 20 MG tablet Commonly known as: PEPCID Take 1 tablet (20 mg total) by mouth 2 (two) times daily.    Glucagon HCl 1 MG Solr Inject 1 mg as directed as directed.   insulin aspart cartridge Commonly known as: NOVOLOG Inject 6 Units into the skin daily with breakfast AND 6 Units daily with lunch AND 8 Units daily with supper.   Insulin Glargine 100 UNIT/ML Solostar Pen Commonly known as: LANTUS Inject 20 Units into the skin daily. What changed: how much to take   insulin lispro 100 UNIT/ML cartridge Commonly known as: HUMALOG Inject 0.06 mLs (6 Units total) into the skin daily with breakfast AND 0.06 mLs (6 Units total) daily with lunch AND 0.08 mLs (8 Units total) daily with supper.   Iron 325 (65 Fe) MG Tabs Take 1 tablet (325 mg total) by mouth every other day.   loperamide 2 MG tablet  Commonly known as: Imodium A-D Take 1 tablet (2 mg total) by mouth 4 (four) times daily as needed for diarrhea or loose stools.   ondansetron 4 MG disintegrating tablet Commonly known as: Zofran ODT Take 1 tablet (4 mg total) by mouth every 6 (six) hours as needed for nausea.   PRENATAL ADULT GUMMY/DHA/FA PO Take 1 tablet by mouth.   promethazine 25 MG tablet Commonly known as: PHENERGAN Take 1 tablet (25 mg total) by mouth every 6 (six) hours as needed for nausea or vomiting.   triamcinolone cream 0.1 % Commonly known as: KENALOG Apply 1 application topically 2 (two) times daily. Use sparingly prn itching        OBJECTIVE:    DM foot exam: 04/08/2019 The skin of the feet is intact without sores or ulcerations. The pedal pulses are 2+ on right and 2+ on left. The sensation is intact to a screening 5.07, 10 gram monofilament bilaterally     DATA REVIEWED:  Lab Results  Component Value Date   HGBA1C 7.8 (H) 05/20/2019   HGBA1C 8.3 (A) 04/08/2019   HGBA1C 16.8 (H) 10/24/2018    Lab Results  Component Value Date   MICRALBCREAT 115.3 (H) 04/08/2019    Results for Ashley Freeman, Ashley Freeman (MRN [id number]) as of 04/08/2019 16:35  Ref. Range 04/08/2019 10:20  MICROALB/CREAT  RATIO Latest Ref Range: 0.0 - 30.0 mg/g 115.3 (H)    ASSESSMENT / PLAN / RECOMMENDATIONS:   1) Type 1 Diabetes Mellitus, Poorly controlled, With microalbuminuria, During Second trimester - Most recent A1c of 7.8 %. Goal A1c < 6.5%.    -Patient with improved glycemic control, but has had frequent DKA's during this pregnancy.  Today we had a long discussion about the importance of glycemic control during pregnancy and how acidosis can be harmful to the mother and baby, we discussed that acidosis is due to hyperglycemia and underdosing of prandial insulin, acidosis could also be caused due to lack of basal insulin but the patient is compliant with her basal insulin -She has severe fear of hypoglycemia and tends to continuously keep glucose close to 200. -Today we reviewed her Dexcom downloads as well as her implant downloads, this morning for example she stated that she had a low BG, upon further questioning her BG was 90 mg/dL (which I explained to her is at goal for pregnancy) the patient drank orange soda with repeat BG in the office is 219 mg/dL .  We use this as an educational moment, we also reviewed the daily trends over the past 2 weeks, the patient came to realize that despite a small amount of sugar sweetened beverages her sugars have been going up to 200s+ -We discussed the importance of getting into a compromise where she is not having hypoglycemic episode but also she is not underdosing her prandial insulin without a good reason. -In review of the Ambien download the patient has mainly been using it from correction rather than giving herself a set dose of breakfast lunch dinner.  I will reduce her prandial dose so she is not scared of giving herself the 2nd dose of prandial, I am also I am also going to adjust her sensitivity factor as below -We did also discuss an insulin pump as an option Per OB recommendation, I explained to the patient that she will need to enter the amount of carbohydrates  she eats to receive appropriate amount of prandial insulin otherwise she is still at risk for DKA due to insufficient insulin  replacement.  Patient opted to continue on the Dexcom and the) regimen at this time. -Repeat BMP today shows a low bicarb but her anion gap is 9.  MEDICATIONS:  Decrease Lantus 18 units ONCE daily   Humalog 5 units 3 times daily before every meal   CF: Novolog (BG-100/60)   EDUCATION / INSTRUCTIONS:  BG monitoring instructions: Patient is instructed to check her blood sugars 7 times a day, before meals, 2 hr post-prandial and bedtime.  Call [place] [facility] if: BG persistently < 70 or > 300. . I reviewed the Rule of 15 for the treatment of hypoglycemia in detail with the patient. Literature supplied.     F/U in 2 weeks  30 minutes were spent with the patient and reviewing the above   Addendum: discussed BMP with the pt on 06/12/2019 at 1630 . Pt advised that Bicarb is low and despite a normal AG , she is at risk for going to DKA. She was advised to check glucose every 3 hours, until her glucose is consistently below 150 mg/dL.  She was again encouraged to bolus for every meal and not just to give herself a correction factor.  Signed electronically by: Lyndle Herrlich, MD  [facility] Endocrinology  [facility] [address] [place], [place] [address] Phone: [phone] FAX: [phone]   CC: Hoy Register, MD [address] [place] [address] Phone: [phone]  Fax: [phone]  Return to Endocrinology clinic as below: Future Appointments  Date Time Provider Department Center  06/26/2019 10:50 AM Kaysie Michelini, Konrad Dolores, MD [facility] None  07/10/2019  9:00 AM [facility] NURSE [facility]  07/10/2019  9:00 AM [facility] [place] 3 [facility]  07/14/2019 10:30 AM [place] Bing, MD [facility]  07/15/2019  2:15 PM [place] Bing, MD [facility]

## 2019-06-30 ENCOUNTER — Telehealth: Payer: Self-pay

## 2019-06-30 ENCOUNTER — Ambulatory Visit: Payer: BLUE CROSS/BLUE SHIELD | Admitting: Internal Medicine

## 2019-06-30 MED ORDER — PRENATAL/IRON PO TABS: 1.0000 | ORAL_TABLET | Freq: Every day | ORAL | 6 refills | Status: DC

## 2019-06-30 NOTE — Telephone Encounter (Signed)
Per Dr. Kennon Rounds, pt needs to be followed up on putting her CBG's into [employer].  Called pt and pt informed me that she has been so busy that she just has not been putting the CBG values into [employer].  Pt states that she can get them faxed to the office because she has a device that automatically checks her sugar.  Pt sent via MyChart the office fax number because she was driving.  Pt stated that she will get her CBG levels faxed to [place].  Mel Almond, RN

## 2019-07-01 ENCOUNTER — Encounter: Payer: Self-pay | Admitting: Radiology

## 2019-07-02 ENCOUNTER — Encounter: Payer: Self-pay | Admitting: Obstetrics & Gynecology

## 2019-07-02 DIAGNOSIS — E11319 Type 2 diabetes mellitus with unspecified diabetic retinopathy without macular edema: Secondary | ICD-10-CM | POA: Insufficient documentation

## 2019-07-03 ENCOUNTER — Telehealth: Payer: Self-pay

## 2019-07-03 ENCOUNTER — Other Ambulatory Visit: Payer: Self-pay | Admitting: Internal Medicine

## 2019-07-03 ENCOUNTER — Telehealth: Payer: Self-pay | Admitting: Internal Medicine

## 2019-07-03 ENCOUNTER — Other Ambulatory Visit: Payer: Self-pay

## 2019-07-03 MED ORDER — INPEN 100-BLUE-NOVO DEVI: 1.0000 | Freq: Once | 0 refills | Status: AC

## 2019-07-03 MED ORDER — "INSULIN SYRINGE 29G X 1/2"" 0.5 ML MISC": 1.0000 | Freq: Three times a day (TID) | 3 refills | Status: DC

## 2019-07-03 NOTE — Telephone Encounter (Signed)
Late entry: Returned patient call cell phone went to mailbox x 2.

## 2019-07-03 NOTE — Telephone Encounter (Signed)
-----   Message from Allena Earing, NT sent at 07/02/2019 10:54 AM EST ----- Regarding: pt call Patient requested a call

## 2019-07-03 NOTE — Telephone Encounter (Signed)
Pt informed and she stated that she will wait until visit on Monday so that you can discuss pump option with her but she said that will need syringes to get her thru the weekend.

## 2019-07-03 NOTE — Telephone Encounter (Signed)
MEDICATION: N-Pen  PHARMACY:  [facility] [place] :   IS PATIENT OUT OF MEDICATION: YES  IF NOT; HOW MUCH IS LEFT:   LAST APPOINTMENT DATE: @2 /03/2020  NEXT APPOINTMENT DATE:@3 /11/2019  DO WE HAVE YOUR PERMISSION TO LEAVE A DETAILED MESSAGE:  OTHER COMMENTS:  She is scheduled for Monday 07/06/2019  **Let patient know to contact pharmacy at the end of the day to make sure medication is ready. **  ** Please notify patient to allow 48-72 hours to process**  **Encourage patient to contact the pharmacy for refills or they can request refills through [facility]**

## 2019-07-03 NOTE — Telephone Encounter (Signed)
Spoke to pt and she stated that he InPen was not giving her insulin and the pharmacy said that she need a new rx for a new one. I encouraged pt to reach out to [facility] to see if she may be able to get a replacement and also reiterated how important keeping her appt's are. Ok to send new rx?

## 2019-07-06 ENCOUNTER — Ambulatory Visit (INDEPENDENT_AMBULATORY_CARE_PROVIDER_SITE_OTHER): Payer: BLUE CROSS/BLUE SHIELD | Admitting: Internal Medicine

## 2019-07-06 ENCOUNTER — Other Ambulatory Visit: Payer: Self-pay

## 2019-07-06 ENCOUNTER — Encounter: Payer: Self-pay | Admitting: Internal Medicine

## 2019-07-06 VITALS — BP 124/78 | HR 109 | Temp 98.9°F | Ht 63.0 in | Wt 137.2 lb

## 2019-07-06 DIAGNOSIS — E1065 Type 1 diabetes mellitus with hyperglycemia: Secondary | ICD-10-CM | POA: Diagnosis not present

## 2019-07-06 DIAGNOSIS — E108 Type 1 diabetes mellitus with unspecified complications: Secondary | ICD-10-CM | POA: Diagnosis not present

## 2019-07-06 DIAGNOSIS — IMO0002 Reserved for concepts with insufficient information to code with codable children: Secondary | ICD-10-CM

## 2019-07-06 LAB — BASIC METABOLIC PANEL
BUN: 12 mg/dL (ref 6–23)
CO2: 22 mEq/L (ref 19–32)
Calcium: 9.3 mg/dL (ref 8.4–10.5)
Chloride: 106 mEq/L (ref 96–112)
Creatinine, Ser: 0.99 mg/dL (ref 0.40–1.20)
GFR: 81.36 mL/min (ref >60.00)
Glucose, Bld: 128 mg/dL — ABNORMAL HIGH (ref 70–99)
Potassium: 4.1 mEq/L (ref 3.5–5.1)
Sodium: 135 mEq/L (ref 135–145)

## 2019-07-06 MED ORDER — INSULIN ASPART 100 UNIT/ML CARTRIDGE (PENFILL): SUBCUTANEOUS | 11 refills | Status: DC

## 2019-07-06 NOTE — Patient Instructions (Addendum)
-    Lantus 18 units daily  -  Novolog 5 units with Breakfast,6 units with  Lunch and 5 units with Supper  - Novolog 2 units with a snack        HOW TO TREAT LOW BLOOD SUGARS (Blood sugar LESS THAN 70 MG/DL)  Please follow the RULE OF 15 for the treatment of hypoglycemia treatment (when your (blood sugars are less than 70 mg/dL)    STEP 1: Take 15 grams of carbohydrates when your blood sugar is low, which includes:   3-4 GLUCOSE TABS  OR  3-4 OZ OF JUICE OR REGULAR SODA OR  ONE TUBE OF GLUCOSE GEL     STEP 2: RECHECK blood sugar in 15 MINUTES STEP 3: If your blood sugar is still low at the 15 minute recheck --> then, go back to STEP 1 and treat AGAIN with another 15 grams of carbohydrates.

## 2019-07-06 NOTE — Progress Notes (Signed)
Name: Ashley Freeman  Age/ Sex: 29 y.o., female   MRN/ DOB: [id number], March 21, 1991     PCP: Hoy Register, MD   Reason for Endocrinology Evaluation: Type 2 Diabetes Mellitus  Initial Endocrine Consultative Visit: 06/27/18    PATIENT IDENTIFIER: [name] is a 29 y.o. female with a past medical history of T1DM with multiple DKA's. The patient has followed with Endocrinology clinic since 06/27/2018 for consultative assistance with management of her diabetes.  DIABETIC HISTORY:  [name] was diagnosed with T1DM at age 5. Hx of multiple DKA's due to non-compliance. Her hemoglobin A1c has ranged from  in 11.3% in 2011, peaking at 19.3% in 2015.   SUBJECTIVE:   During the last visit (12/10/2018): A1c 8.3% .  We decreased Lantus and continued Humalog.    Today (07/06/2019): [name] is here for a follow up on diabetes management during pregnancy. She is currently pregnant at 69.2 Weeks of gestation. She  checks her blood sugars 3-4 times daily, through the dexcom . The is much more comfortable with the current regimen but continues to underdose    Las eye exam 12/12/2018    ROS: As per HPI and as detailed below: Review of Systems  Constitutional: Negative for chills and fever.  HENT: Negative for congestion and sore throat.   Cardiovascular: Negative for chest pain and palpitations.  Gastrointestinal: Negative for diarrhea and nausea.  Neurological: Negative for tingling and tremors.       HOME DIABETES REGIMEN:   Lantus 18  units daily  Novolog ([place]) 5 units TIDQAC  CF : (BG-100/60)      CONTINUOUS GLUCOSE MONITORING RECORD INTERPRETATION    Dates of Recording: 2/20-07/03/2019 Sensor description: Dexcom  Results statistics:   CGM use % of time 14  Average and SD 155/42  Time in range   71  %  % Time Above 180 28  % Time above 250 <1  % Time Below target 0     Glycemic patterns summary:  hyperglycemia noted mostly in the late afteroon  Hypoglycemic episodes occurred none   Overnight periods: steady  In-Pen report (14 day) :  Average glucose : 157 mg/dL Missed Doses : 33 doses  TTD 29.2 units        HISTORY:  Past Medical History:  Past Medical History:  Diagnosis Date  . Abscess, gluteal, right 08/24/2013  . AKI (acute kidney injury) (HCC) 07/26/2014  . Anemia 02/19/2012  . Bartholin's gland abscess 09/19/2013  . BV (bacterial vaginosis) 11/24/2015  . Diabetes mellitus 2001   Diagnosed at age 73 ; Type I  . Diarrhea 05/30/2016  . DKA (diabetic ketoacidoses) (HCC) 08/19/2013  . DKA (diabetic ketoacidoses) (HCC) 05/30/2016  . Gonorrhea 08/2011   Treated in 09/2011  . History of trichomoniasis 05/31/2016  . Hyperlipidemia 03/28/2016  . Sepsis (HCC) 09/19/2013   Past Surgical History:  Past Surgical History:  Procedure Laterality Date  . INCISION AND DRAINAGE PERIRECTAL ABSCESS Right 08/18/2013   Procedure: IRRIGATION AND DEBRIDEMENT GLUTEAL ABSCESS;  Surgeon: Axel Filler, MD;  Location: [facility] OR;  Service: General;  Laterality: Right;  . INCISION AND DRAINAGE PERIRECTAL ABSCESS Right 09/19/2013   Procedure: IRRIGATION AND DEBRIDEMENT RIGHT GLUTEAL AND LABIAL ABSCESSES;  Surgeon: Axel Filler, MD;  Location: [facility] OR;  Service: General;  Laterality: Right;  . INCISION AND DRAINAGE PERIRECTAL ABSCESS Right 09/24/2013   Procedure: IRRIGATION AND DEBRIDEMENT PERIRECTAL ABSCESS;  Surgeon: Cherylynn Ridges, MD;  Location: [facility] OR;  Service: General;  Laterality:  Right;    Social History:  reports that she has never smoked. She has never used smokeless tobacco. She reports previous alcohol use. She reports that she does not use drugs. Family History:  Family History  Problem Relation Age of Onset  . Asthma Mother   . Carpal tunnel syndrome Mother   . Gout Father   . Diabetes Paternal Grandmother   . Anesthesia problems Neg Hx      HOME MEDICATIONS: Allergies as of  07/06/2019      Reactions   Penicillins Hives, Rash   Has patient had a PCN reaction causing immediate rash, facial/tongue/throat swelling, SOB or lightheadedness with hypotension: Yes Has patient had a PCN reaction causing severe rash involving mucus membranes or skin necrosis: No Has patient had a PCN reaction that required hospitalization: Yes Has patient had a PCN reaction occurring within the last 10 years: No If all of the above answers are "NO", then may proceed with Cephalosporin use.   Benadryl [diphenhydramine] Itching   Doxycycline Itching      Medication List       Accurate as of July 06, 2019  1:20 PM. If you have any questions, ask your nurse or doctor.        aspirin EC 81 MG tablet Take 1 tablet (81 mg total) by mouth daily. Take after 12 weeks for prevention of preeclampsia later in pregnancy   blood glucose meter kit and supplies Kit Dispense based on patient and insurance preference. Use up to four times daily as directed. (FOR ICD-9 250.00, 250.01).   Blood Pressure Kit Devi 1 Device by Does not apply route as needed.   cetirizine 10 MG tablet Commonly known as: ZYRTEC Take 1 tablet (10 mg total) by mouth daily.   Dexcom G6 Receiver Devi 1 Device by Does not apply route as directed.   Dexcom G6 Sensor Misc 1 Device by Does not apply route as directed.   Dexcom G6 Transmitter Misc 1 Device by Does not apply route as directed.   famotidine 20 MG tablet Commonly known as: PEPCID Take 1 tablet (20 mg total) by mouth 2 (two) times daily.   Glucagon HCl 1 MG Solr Inject 1 mg as directed as directed.   insulin aspart cartridge Commonly known as: NOVOLOG Inject 6 Units into the skin daily with breakfast AND 6 Units daily with lunch AND 8 Units daily with supper.   insulin glargine 100 UNIT/ML Solostar Pen Commonly known as: LANTUS Inject 20 Units into the skin daily. What changed: how much to take   INSULIN SYRINGE .5CC/29G 29G X 1/2" 0.5 ML  Misc Commonly known as: Safety Insulin Syringes 1 Device by Does not apply route 3 (three) times daily.   Iron 325 (65 Fe) MG Tabs Take 1 tablet (325 mg total) by mouth every other day.   loperamide 2 MG tablet Commonly known as: Imodium A-D Take 1 tablet (2 mg total) by mouth 4 (four) times daily as needed for diarrhea or loose stools.   ondansetron 4 MG disintegrating tablet Commonly known as: Zofran ODT Take 1 tablet (4 mg total) by mouth every 6 (six) hours as needed for nausea.   PRENATAL ADULT GUMMY/DHA/FA PO Take 1 tablet by mouth.   Prenatal/Iron Tabs Take 1 tablet by mouth daily.   promethazine 25 MG tablet Commonly known as: PHENERGAN Take 1 tablet (25 mg total) by mouth every 6 (six) hours as needed for nausea or vomiting.   triamcinolone cream 0.1 % Commonly  known as: KENALOG Apply 1 application topically 2 (two) times daily. Use sparingly prn itching        OBJECTIVE:   PHYSICAL EXAM: VS: BP 124/78 (BP Location: Right Arm, Patient Position: Sitting, Cuff Size: Normal)   Pulse (!) 109   Temp 98.9 F (37.2 C)   Ht 5\' 3"  (1.6 m)   Wt 137 lb 3.2 oz (62.2 kg)   LMP  (LMP Unknown)   SpO2 99%   BMI 24.30 kg/m    EXAM: General: Pt appears well and is in NAD  Neck: General: Supple without adenopathy. Thyroid: Thyroid size normal.  No goiter or nodules appreciated. No thyroid bruit.  Lungs: Clear with good BS bilat with no rales, rhonchi, or wheezes  Heart: Auscultation: RRR with normal S1 and S2, no gallops or murmurs Carotid arteries: no bruits Periph. circulation: no peripheral edema  Abdomen: Gravid uterus   Extremities:  BL LE: no pretibial edema normal ROM and strength, no joint enlargement or tenderness  Mental Status: Judgment, insight: intact Orientation: oriented to time, place, and person Mood and affect: no depression, anxiety, or agitation    DM foot exam: 04/08/2019 The skin of the feet is intact without sores or ulcerations. The pedal  pulses are 2+ on right and 2+ on left. The sensation is intact to a screening 5.07, 10 gram monofilament bilaterally     DATA REVIEWED:  Lab Results  Component Value Date   HGBA1C 7.8 (H) 05/20/2019   HGBA1C 8.3 (A) 04/08/2019   HGBA1C 16.8 (H) 10/24/2018    Lab Results  Component Value Date   MICRALBCREAT 115.3 (H) 04/08/2019    Results for JEWELLE, ROWBERRY (MRN [id number]) as of 04/08/2019 16:35  Ref. Range 04/08/2019 10:20  MICROALB/CREAT RATIO Latest Ref Range: 0.0 - 30.0 mg/g 115.3 (H)    ASSESSMENT / PLAN / RECOMMENDATIONS:   1) Type 1 Diabetes Mellitus, Poorly controlled, With microalbuminuria, During Second trimester - Most recent A1c of 7.8 %. Goal A1c < 6.5%.     -[name] has been taking better control of her glycemic control, her average glucose is now averaging around 155 mg/dL versus 409 mg/dL a few weeks ago. -She has severe fear of hypoglycemia, unfortunately this makes her underdose on her insulin, we have finally got to the point where she is comfortable with her Lantus dose, but she continues to underdosing her prandial insulin. -She does take 2 units of NovoLog with her snacks -We again discussed an insulin pump, patient seems to be much more acceptable to this at this time.  Will start training and application of Omni pod.  Our CDE is aware -I have praised her on the recent effort -Repeat BMP today shows normal AG and bicarb      MEDICATIONS:  Lantus 18 units ONCE daily   Novolog 5 units with breakfast, 6 units with lunch, 5 units with supper   CF: Novolog (BG-100/60)   EDUCATION / INSTRUCTIONS:  BG monitoring instructions: Patient is instructed to check her blood sugars 7 times a day, before meals, 2 hr post-prandial and bedtime.  Call  Endocrinology clinic if: BG persistently < 70 or > 300. . I reviewed the Rule of 15 for the treatment of hypoglycemia in detail with the patient. Literature supplied.     F/U in 2  weeks     Signed electronically by: Lyndle Herrlich, MD  [facility]  [facility] [address] [place], [place] [address] Phone:  [phone] FAX: [phone]   CC: Hoy Register, MD [address] [place] [address] Phone: [phone]  Fax: [phone]  Return to Endocrinology clinic as below: Future Appointments  Date Time Provider Department Center  07/06/2019  1:40 PM Kassim Guertin, Konrad Dolores, MD [facility] None  07/10/2019  9:00 AM [facility] NURSE [facility]  07/10/2019  9:00 AM [facility] [place] 3 [facility]  07/14/2019 10:30 AM Fayette Bing, MD [facility]

## 2019-07-07 ENCOUNTER — Telehealth: Payer: Self-pay | Admitting: Nutrition

## 2019-07-07 MED ORDER — OMNIPOD DASH PODS (GEN 4) MISC: 1.0000 | 3 refills | Status: DC

## 2019-07-07 NOTE — Telephone Encounter (Signed)
Per Dr. Quin Hoop request, paperwork and insurance information was faxed to OmniPod insulin pump.

## 2019-07-07 NOTE — Telephone Encounter (Signed)
PA for omnipod started on cover my meds [facility] bal Key: [id number] - Rx #: [id number]

## 2019-07-10 ENCOUNTER — Encounter (HOSPITAL_COMMUNITY): Payer: BLUE CROSS/BLUE SHIELD

## 2019-07-10 ENCOUNTER — Other Ambulatory Visit: Payer: Self-pay

## 2019-07-10 ENCOUNTER — Other Ambulatory Visit (HOSPITAL_COMMUNITY): Payer: Self-pay | Admitting: *Deleted

## 2019-07-10 ENCOUNTER — Ambulatory Visit (HOSPITAL_COMMUNITY)
Admission: RE | Admit: 2019-07-10 | Discharge: 2019-07-10 | Disposition: A | Discharge status: Home / Self Care | Payer: BLUE CROSS/BLUE SHIELD | Source: Ambulatory Visit | Attending: Obstetrics and Gynecology | Admitting: Obstetrics and Gynecology

## 2019-07-10 ENCOUNTER — Encounter (HOSPITAL_COMMUNITY): Payer: Self-pay

## 2019-07-10 ENCOUNTER — Ambulatory Visit (HOSPITAL_COMMUNITY): Payer: BLUE CROSS/BLUE SHIELD | Admitting: *Deleted

## 2019-07-10 VITALS — BP 130/88 | HR 104 | Temp 97.5°F

## 2019-07-10 DIAGNOSIS — O2690 Pregnancy related conditions, unspecified, unspecified trimester: Secondary | ICD-10-CM

## 2019-07-10 DIAGNOSIS — E1065 Type 1 diabetes mellitus with hyperglycemia: Secondary | ICD-10-CM

## 2019-07-10 DIAGNOSIS — O0990 Supervision of high risk pregnancy, unspecified, unspecified trimester: Secondary | ICD-10-CM

## 2019-07-10 DIAGNOSIS — O24019 Pre-existing diabetes mellitus, type 1, in pregnancy, unspecified trimester: Secondary | ICD-10-CM | POA: Insufficient documentation

## 2019-07-10 DIAGNOSIS — O24319 Unspecified pre-existing diabetes mellitus in pregnancy, unspecified trimester: Secondary | ICD-10-CM

## 2019-07-10 DIAGNOSIS — Z363 Encounter for antenatal screening for malformations: Secondary | ICD-10-CM

## 2019-07-10 DIAGNOSIS — Z3A19 19 weeks gestation of pregnancy: Secondary | ICD-10-CM

## 2019-07-10 DIAGNOSIS — O24011 Pre-existing diabetes mellitus, type 1, in pregnancy, first trimester: Secondary | ICD-10-CM | POA: Diagnosis present

## 2019-07-10 DIAGNOSIS — Z794 Long term (current) use of insulin: Secondary | ICD-10-CM | POA: Insufficient documentation

## 2019-07-10 DIAGNOSIS — O24312 Unspecified pre-existing diabetes mellitus in pregnancy, second trimester: Secondary | ICD-10-CM

## 2019-07-10 NOTE — Telephone Encounter (Signed)
[facility] [name] - Rx #: [id number] Outcome Approvedtoday Effective from 07/07/2019 through 07/05/2020.

## 2019-07-13 ENCOUNTER — Telehealth: Payer: Self-pay | Admitting: Nutrition

## 2019-07-13 NOTE — Telephone Encounter (Signed)
Patient was told that a PDM and pods will be coming in the mail.  She will need to give me a call to schedule training for the pump when she gets them.  Telephone number given.  She reported good understanding of this.

## 2019-07-14 ENCOUNTER — Encounter: Payer: Self-pay | Admitting: Obstetrics and Gynecology

## 2019-07-14 ENCOUNTER — Other Ambulatory Visit: Payer: Self-pay

## 2019-07-14 ENCOUNTER — Ambulatory Visit (INDEPENDENT_AMBULATORY_CARE_PROVIDER_SITE_OTHER): Payer: BLUE CROSS/BLUE SHIELD | Admitting: Obstetrics and Gynecology

## 2019-07-14 VITALS — BP 134/86 | HR 111 | Wt 138.0 lb

## 2019-07-14 DIAGNOSIS — Z3A20 20 weeks gestation of pregnancy: Secondary | ICD-10-CM

## 2019-07-14 DIAGNOSIS — O0990 Supervision of high risk pregnancy, unspecified, unspecified trimester: Secondary | ICD-10-CM

## 2019-07-14 DIAGNOSIS — O24012 Pre-existing diabetes mellitus, type 1, in pregnancy, second trimester: Secondary | ICD-10-CM

## 2019-07-14 DIAGNOSIS — O10919 Unspecified pre-existing hypertension complicating pregnancy, unspecified trimester: Secondary | ICD-10-CM

## 2019-07-14 DIAGNOSIS — O0992 Supervision of high risk pregnancy, unspecified, second trimester: Secondary | ICD-10-CM

## 2019-07-14 DIAGNOSIS — E1029 Type 1 diabetes mellitus with other diabetic kidney complication: Secondary | ICD-10-CM

## 2019-07-14 DIAGNOSIS — O139 Gestational [pregnancy-induced] hypertension without significant proteinuria, unspecified trimester: Secondary | ICD-10-CM

## 2019-07-14 DIAGNOSIS — E13319 Other specified diabetes mellitus with unspecified diabetic retinopathy without macular edema: Secondary | ICD-10-CM

## 2019-07-14 DIAGNOSIS — E1065 Type 1 diabetes mellitus with hyperglycemia: Secondary | ICD-10-CM

## 2019-07-14 DIAGNOSIS — O132 Gestational [pregnancy-induced] hypertension without significant proteinuria, second trimester: Secondary | ICD-10-CM

## 2019-07-14 MED ORDER — FAMOTIDINE 20 MG PO TABS: 20.0000 mg | ORAL_TABLET | Freq: Two times a day (BID) | ORAL | 2 refills | Status: DC

## 2019-07-14 NOTE — Progress Notes (Signed)
Seeing endocrinologist.  She will be starting with an insulin pump on Friday.

## 2019-07-14 NOTE — Progress Notes (Signed)
Prenatal Visit Note Date: 07/14/2019 Clinic: [facility]  Subjective:  Ashley Freeman is a 29 y.o. G2P0010 at [redacted]w[redacted]d being seen today for ongoing prenatal care.  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  Patient reports no complaints.   Contractions: Not present. Vag. Bleeding: None.  Movement: Present. Denies leaking of fluid.   The following portions of the patient's history were reviewed and updated as appropriate: allergies, current medications, past family history, past medical history, past social history, past surgical history and problem list. Problem list updated.  Objective:   Vitals:   07/14/19 1034 07/14/19 1044  BP: (!) 140/97 134/86  Pulse: (!) 111   Weight: 138 lb (62.6 kg)     Fetal Status: Fetal Heart Rate (bpm): 148   Movement: Present     General:  Alert, oriented and cooperative. Patient is in no acute distress.  Skin: Skin is warm and dry. No rash noted.   Cardiovascular: Normal heart rate noted  Respiratory: Normal respiratory effort, no problems with respiration noted  Abdomen: Soft, gravid, appropriate for gestational age. Pain/Pressure: Present     Pelvic:  Cervical exam deferred        Extremities: Normal range of motion.  Edema: Trace  Mental Status: Normal mood and affect. Normal behavior. Normal judgment and thought  content.   Urinalysis:      Assessment and Plan:  Pregnancy: G2P0010 at [redacted]w[redacted]d  1. Pre-existing type 1 diabetes mellitus during pregnancy in first trimester ([place]) Followed by [facility] [place]. D/w her goals are 95-100 for AM fasting and 120 or less for 2 hour post meals or 140 or less for 1 hour less post meals. It looks she is running higher than this; she is due to get a pump started with endocrine sometime early next week. Endocrine was CC'ed to let them know re: goals.  Peds fetal echo in one week - AFP, Serum, Open Spina Bifida  2. Supervision of high risk pregnancy, antepartum S/p anatomy u/s  Confirms low dose asa - AFP, Serum, Open Spina Bifida  3. Chronic hypertension during pregnancy, antepartum Based prior non pregnant BPs I would give her this diagnosis. No change in prenatal care  Preterm labor symptoms and general obstetric precautions including but not limited to vaginal bleeding, contractions, leaking of fluid and fetal movement were reviewed in detail with the patient. Please refer to After Visit Summary for other counseling recommendations.  Return in about 2 weeks (around 07/28/2019) for virtual visit, high risk.   Aletha Halim, MD

## 2019-07-15 ENCOUNTER — Other Ambulatory Visit: Payer: Self-pay

## 2019-07-15 ENCOUNTER — Encounter: Payer: BLUE CROSS/BLUE SHIELD | Admitting: Obstetrics and Gynecology

## 2019-07-15 ENCOUNTER — Telehealth: Payer: BLUE CROSS/BLUE SHIELD | Admitting: Obstetrics & Gynecology

## 2019-07-15 ENCOUNTER — Encounter: Payer: BLUE CROSS/BLUE SHIELD | Admitting: Nutrition

## 2019-07-16 ENCOUNTER — Telehealth: Payer: Self-pay | Admitting: Nutrition

## 2019-07-16 ENCOUNTER — Other Ambulatory Visit: Payer: Self-pay

## 2019-07-16 ENCOUNTER — Other Ambulatory Visit: Payer: Self-pay | Admitting: Obstetrics and Gynecology

## 2019-07-16 LAB — AFP, SERUM, OPEN SPINA BIFIDA
AFP MoM: 1.15
AFP Value: 78.9 ng/mL
Gest. Age on Collection Date: 20.3 weeks
Maternal Age At EDD: 27.4 yr
OSBR Risk 1 IN: 10000
Test Results:: NEGATIVE
Weight: 138 [lb_av]

## 2019-07-16 MED ORDER — PANTOPRAZOLE SODIUM 20 MG PO TBEC: 20.0000 mg | DELAYED_RELEASE_TABLET | Freq: Every day | ORAL | 1 refills | Status: DC

## 2019-07-16 NOTE — Telephone Encounter (Signed)
Patient came in for an appointment that was scheduled by [facility], but did not have her pump as yet.  She was told again to call me when pump comes in to schedule training.  She agreed to do this.

## 2019-07-20 ENCOUNTER — Ambulatory Visit: Payer: BLUE CROSS/BLUE SHIELD | Admitting: Internal Medicine

## 2019-07-20 NOTE — Progress Notes (Deleted)
Name: Ashley Freeman  Age/ Sex: 29 y.o., female   MRN/ DOB: [id number], Jun 12, 1990     PCP: Hoy Register, MD   Reason for Endocrinology Evaluation: Type 2 Diabetes Mellitus  Initial Endocrine Consultative Visit: 06/27/18    PATIENT IDENTIFIER: Ashley Freeman is a 29 y.o. female with a past medical history of T1DM with multiple DKA's. The patient has followed with Endocrinology clinic since 06/27/2018 for consultative assistance with management of her diabetes.  DIABETIC HISTORY:  Ms. Dorff was diagnosed with T1DM at age 35. Hx of multiple DKA's due to non-compliance. Her hemoglobin A1c has ranged from  in 11.3% in 2011, peaking at 19.3% in 2015.   SUBJECTIVE:   During the last visit (07/06/2019): A1c 7.8% .  We continued  Lantus and Novolog    Today (07/20/2019): Ashley Freeman is here for a follow up on diabetes management during pregnancy. She is currently pregnant at 65.2 Weeks of gestation. She  checks her blood sugars 3-4 times daily, through the dexcom . The is much more comfortable with the current regimen but continues to underdose    [facility] eye exam 12/12/2018    ROS: As per HPI and as detailed below: Review of Systems  Constitutional: Negative for chills and fever.  HENT: Negative for congestion and sore throat.   Cardiovascular: Negative for chest pain and palpitations.  Gastrointestinal: Negative for diarrhea and nausea.  Neurological: Negative for tingling and tremors.       HOME DIABETES REGIMEN:   Lantus 18  units daily  Novolog ([place]) 5 units TIDQAC  CF : (BG-100/60)      CONTINUOUS GLUCOSE MONITORING RECORD INTERPRETATION    Dates of Recording: 2/20-07/03/2019 Sensor description: Dexcom  Results statistics:   CGM use % of time 14  Average and SD 155/42  Time in range   71  %  % Time Above 180 28  % Time above 250 <1  % Time Below target 0     Glycemic patterns summary: hyperglycemia noted  mostly in the late afteroon  Hypoglycemic episodes occurred none   Overnight periods: steady  In-Pen report (14 day) :  Average glucose : 157 mg/dL Missed Doses : 33 doses  TTD 29.2 units        HISTORY:  Past Medical History:  Past Medical History:  Diagnosis Date  . Abscess, gluteal, right 08/24/2013  . AKI (acute kidney injury) (HCC) 07/26/2014  . Anemia 02/19/2012  . Bartholin's gland abscess 09/19/2013  . BV (bacterial vaginosis) 11/24/2015  . Diabetes mellitus 2001   Diagnosed at age 77 ; Type I  . Diarrhea 05/30/2016  . DKA (diabetic ketoacidoses) (HCC) 08/19/2013  . DKA (diabetic ketoacidoses) (HCC) 05/30/2016  . Gonorrhea 08/2011   Treated in 09/2011  . History of trichomoniasis 05/31/2016  . Hyperlipidemia 03/28/2016  . Sepsis (HCC) 09/19/2013   Past Surgical History:  Past Surgical History:  Procedure Laterality Date  . INCISION AND DRAINAGE PERIRECTAL ABSCESS Right 08/18/2013   Procedure: IRRIGATION AND DEBRIDEMENT GLUTEAL ABSCESS;  Surgeon: Axel Filler, MD;  Location: [facility] OR;  Service: General;  Laterality: Right;  . INCISION AND DRAINAGE PERIRECTAL ABSCESS Right 09/19/2013   Procedure: IRRIGATION AND DEBRIDEMENT RIGHT GLUTEAL AND LABIAL ABSCESSES;  Surgeon: Axel Filler, MD;  Location: [facility] OR;  Service: General;  Laterality: Right;  . INCISION AND DRAINAGE PERIRECTAL ABSCESS Right 09/24/2013   Procedure: IRRIGATION AND DEBRIDEMENT PERIRECTAL ABSCESS;  Surgeon: Cherylynn Ridges, MD;  Location: [facility] OR;  Service: General;  Laterality:  Right;    Social History:  reports that she has never smoked. She has never used smokeless tobacco. She reports previous alcohol use. She reports that she does not use drugs. Family History:  Family History  Problem Relation Age of Onset  . Asthma Mother   . Carpal tunnel syndrome Mother   . Gout Father   . Diabetes Paternal Grandmother   . Anesthesia problems Neg Hx      HOME MEDICATIONS: Allergies as of 07/20/2019       Reactions   Penicillins Hives, Rash   Has patient had a PCN reaction causing immediate rash, facial/tongue/throat swelling, SOB or lightheadedness with hypotension: Yes Has patient had a PCN reaction causing severe rash involving mucus membranes or skin necrosis: No Has patient had a PCN reaction that required hospitalization: Yes Has patient had a PCN reaction occurring within the last 10 years: No If all of the above answers are "NO", then may proceed with Cephalosporin use.   Benadryl [diphenhydramine] Itching   Doxycycline Itching      Medication List       Accurate as of July 20, 2019  7:37 AM. If you have any questions, ask your nurse or doctor.        aspirin EC 81 MG tablet Take 1 tablet (81 mg total) by mouth daily. Take after 12 weeks for prevention of preeclampsia later in pregnancy   blood glucose meter kit and supplies Kit Dispense based on patient and insurance preference. Use up to four times daily as directed. (FOR ICD-9 250.00, 250.01).   Blood Pressure Kit Devi 1 Device by Does not apply route as needed.   cetirizine 10 MG tablet Commonly known as: ZYRTEC Take 1 tablet (10 mg total) by mouth daily.   Dexcom G6 Receiver Devi 1 Device by Does not apply route as directed.   Dexcom G6 Sensor Misc 1 Device by Does not apply route as directed.   Dexcom G6 Transmitter Misc 1 Device by Does not apply route as directed.   Glucagon HCl 1 MG Solr Inject 1 mg as directed as directed.   insulin aspart cartridge Commonly known as: NOVOLOG Inject 5 Units into the skin daily with breakfast AND 6 Units daily with lunch AND 5 Units daily with supper.   insulin glargine 100 UNIT/ML Solostar Pen Commonly known as: LANTUS Inject 20 Units into the skin daily. What changed: how much to take   INSULIN SYRINGE .5CC/29G 29G X 1/2" 0.5 ML Misc Commonly known as: Safety Insulin Syringes 1 Device by Does not apply route 3 (three) times daily.   Iron 325 (65 Fe) MG  Tabs Take 1 tablet (325 mg total) by mouth every other day.   loperamide 2 MG tablet Commonly known as: Imodium A-D Take 1 tablet (2 mg total) by mouth 4 (four) times daily as needed for diarrhea or loose stools.   OmniPod Dash 5 Pack Pods Misc 1 Package by Does not apply route as directed.   ondansetron 4 MG disintegrating tablet Commonly known as: Zofran ODT Take 1 tablet (4 mg total) by mouth every 6 (six) hours as needed for nausea.   pantoprazole 20 MG tablet Commonly known as: Protonix Take 1 tablet (20 mg total) by mouth daily. Can increase to bid prn   PRENATAL ADULT GUMMY/DHA/FA PO Take 1 tablet by mouth.   Prenatal/Iron Tabs Take 1 tablet by mouth daily.   triamcinolone cream 0.1 % Commonly known as: KENALOG Apply 1 application topically 2 (two)  times daily. Use sparingly prn itching        OBJECTIVE:   PHYSICAL EXAM: VS: LMP  (LMP Unknown)    EXAM: General: Pt appears well and is in NAD  Neck: General: Supple without adenopathy. Thyroid: Thyroid size normal.  No goiter or nodules appreciated. No thyroid bruit.  Lungs: Clear with good BS bilat with no rales, rhonchi, or wheezes  Heart: Auscultation: RRR with normal S1 and S2, no gallops or murmurs Carotid arteries: no bruits Periph. circulation: no peripheral edema  Abdomen: Gravid uterus   Extremities:  BL LE: no pretibial edema normal ROM and strength, no joint enlargement or tenderness  Mental Status: Judgment, insight: intact Orientation: oriented to time, place, and person Mood and affect: no depression, anxiety, or agitation    DM foot exam: 04/08/2019 The skin of the feet is intact without sores or ulcerations. The pedal pulses are 2+ on right and 2+ on left. The sensation is intact to a screening 5.07, 10 gram monofilament bilaterally     DATA REVIEWED:  Lab Results  Component Value Date   HGBA1C 7.8 (H) 05/20/2019   HGBA1C 8.3 (A) 04/08/2019   HGBA1C 16.8 (H) 10/24/2018    Lab  Results  Component Value Date   MICRALBCREAT 115.3 (H) 04/08/2019    Results for JENNENE, HEMMERICH (MRN [id number]) as of 04/08/2019 16:35  Ref. Range 04/08/2019 10:20  MICROALB/CREAT RATIO Latest Ref Range: 0.0 - 30.0 mg/g 115.3 (H)    ASSESSMENT / PLAN / RECOMMENDATIONS:   1) Type 1 Diabetes Mellitus, Poorly controlled, With microalbuminuria, During Second trimester - Most recent A1c of 7.8 %. Goal A1c < 6.5%.     -Ashley Freeman has been taking better control of her glycemic control, her average glucose is now averaging around 155 mg/dL versus 409 mg/dL a few weeks ago. -She has severe fear of hypoglycemia, unfortunately this makes her underdose on her insulin, we have finally got to the point where she is comfortable with her Lantus dose, but she continues to underdosing her prandial insulin. -She does take 2 units of NovoLog with her snacks -We again discussed an insulin pump, patient seems to be much more acceptable to this at this time.  Will start training and application of Omni pod.  Our CDE is aware -I have praised her on the recent effort -Repeat BMP today shows normal AG and bicarb      MEDICATIONS:  Lantus 18 units ONCE daily   Novolog 5 units with breakfast, 6 units with lunch, 5 units with supper   CF: Novolog (BG-100/60)   EDUCATION / INSTRUCTIONS:  BG monitoring instructions: Patient is instructed to check her blood sugars 7 times a day, before meals, 2 hr post-prandial and bedtime.  Call [facility] if: BG persistently < 70 or > 300. . I reviewed the Rule of 15 for the treatment of hypoglycemia in detail with the patient. Literature supplied.     F/U in 2 weeks     Signed electronically by: Lyndle Herrlich, MD  [facility]  [facility] [address] [place], [place] [address] Phone: [phone] FAX: [phone]   CC: Hoy Register, MD [address] [place]  [address] Phone: [phone]  Fax: [phone]  Return to Endocrinology clinic as below: Future Appointments  Date Time Provider Department Center  07/20/2019  8:50 AM Mikesha Migliaccio, Konrad Dolores, MD [facility] None  07/28/2019 11:15 AM Boswell Bing, MD [facility]  08/07/2019  9:30  AM [facility] NURSE [facility]  08/07/2019  9:30 AM [facility] [place] 1 [facility]

## 2019-07-23 ENCOUNTER — Ambulatory Visit (HOSPITAL_COMMUNITY)
Admission: EM | Admit: 2019-07-23 | Discharge: 2019-07-23 | Disposition: A | Discharge status: Home / Self Care | Payer: BLUE CROSS/BLUE SHIELD | Attending: Emergency Medicine | Admitting: Emergency Medicine

## 2019-07-23 ENCOUNTER — Encounter (HOSPITAL_COMMUNITY): Payer: Self-pay

## 2019-07-23 ENCOUNTER — Telehealth: Payer: Self-pay | Admitting: Nutrition

## 2019-07-23 ENCOUNTER — Other Ambulatory Visit: Payer: Self-pay

## 2019-07-23 DIAGNOSIS — R0981 Nasal congestion: Secondary | ICD-10-CM | POA: Diagnosis not present

## 2019-07-23 DIAGNOSIS — H6121 Impacted cerumen, right ear: Secondary | ICD-10-CM

## 2019-07-23 MED ORDER — FLUTICASONE PROPIONATE 50 MCG/ACT NA SUSP: 1.0000 | Freq: Every day | NASAL | 0 refills | Status: DC

## 2019-07-23 NOTE — Telephone Encounter (Signed)
Per message left on my pump, that patient had received her pump, she was call to schedule this training.  Message left on VM that I will try to work her on Monday if possisble, and to please call me back to schedule this today

## 2019-07-23 NOTE — Discharge Instructions (Signed)
Wax removed from right ear May use over the counter debrox to help prevent further build up Flonase nasal spray 1-2 spray each nostril daily for the next 1-2 weeks  Follow up if symptoms changing/worsening

## 2019-07-23 NOTE — ED Provider Notes (Signed)
[place]    CSN: [id number] Arrival date & time: 07/23/19  6433      History   Chief Complaint Chief Complaint  Patient presents with  . unable to hear out of right ear and slightly left ear    HPI Ashley Freeman is a 29 y.o. female history of DM type I, currently [redacted] weeks pregnant, presenting today for evaluation of decreased hearing.  Patient notes yesterday she was cleaning out her right ear with a Q-tip, and then developed decreased hearing on the right side.  She has had some mild discomfort associated with this.  She denies any drainage.  Mild symptoms on left side.  Does have associated nasal congestion recently.  Denies cough or sore throat.  Denies fevers chills or body aches.  Denies history of wax buildup.   HPI  Past Medical History:  Diagnosis Date  . Abscess, gluteal, right 08/24/2013  . AKI (acute kidney injury) ([place]) 07/26/2014  . Anemia 02/19/2012  . Bartholin's gland abscess 09/19/2013  . BV (bacterial vaginosis) 11/24/2015  . Diabetes mellitus 2001   Diagnosed at age 65 ; Type I  . Diarrhea 05/30/2016  . DKA (diabetic ketoacidoses) ([place]) 08/19/2013  . DKA (diabetic ketoacidoses) ([place]) 05/30/2016  . Gonorrhea 08/2011   Treated in 09/2011  . History of trichomoniasis 05/31/2016  . Hyperlipidemia 03/28/2016  . Sepsis ([place]) 09/19/2013    Patient Active Problem List   Diagnosis Date Noted  . Chronic hypertension during pregnancy, antepartum 07/14/2019  . Diabetic retinopathy ([place]) 07/02/2019  . Proteinuria due to type 1 diabetes mellitus ([place]) 05/21/2019  . Proteinuria complicating pregnancy 29/51/8841  . Supervision of high risk pregnancy, antepartum 05/06/2019  . Pre-existing type 1 diabetes mellitus during pregnancy in first trimester ([place]) 05/06/2019  . Diabetic neuropathy ([place]) 01/16/2016  . GBS (group b Streptococcus) UTI complicating pregnancy, first trimester 04/08/2014  . Diabetic ketoacidosis without coma associated with type  1 diabetes mellitus ([place]) 09/19/2013  . Diabetes mellitus type 1, uncontrolled, with complications ([place]) 66/09/3014    Past Surgical History:  Procedure Laterality Date  . INCISION AND DRAINAGE PERIRECTAL ABSCESS Right 08/18/2013   Procedure: IRRIGATION AND DEBRIDEMENT GLUTEAL ABSCESS;  Surgeon: Ralene Ok, MD;  Location: [facility];  Service: General;  Laterality: Right;  . INCISION AND DRAINAGE PERIRECTAL ABSCESS Right 09/19/2013   Procedure: IRRIGATION AND DEBRIDEMENT RIGHT GLUTEAL AND LABIAL ABSCESSES;  Surgeon: Ralene Ok, MD;  Location: [facility];  Service: General;  Laterality: Right;  . INCISION AND DRAINAGE PERIRECTAL ABSCESS Right 09/24/2013   Procedure: IRRIGATION AND DEBRIDEMENT PERIRECTAL ABSCESS;  Surgeon: Gwenyth Ober, MD;  Location: [facility];  Service: General;  Laterality: Right;    OB History    Gravida  2   Para  0   Term      Preterm      AB  1   Living  0     SAB  1   TAB      Ectopic      Multiple      Live Births               Home Medications    Prior to Admission medications   Medication Sig Start Date End Date Taking? Authorizing Provider  aspirin EC 81 MG tablet Take 1 tablet (81 mg total) by mouth daily. Take after 12 weeks for prevention of preeclampsia later in pregnancy 05/20/19   Anyanwu, Sallyanne Havers, MD  blood glucose meter kit and supplies KIT Dispense  based on patient and insurance preference. Use up to four times daily as directed. (FOR ICD-9 250.00, 250.01). 05/25/17   Haviland, Julie, MD  Blood Pressure Monitoring (BLOOD PRESSURE KIT) DEVI 1 Device by Does not apply route as needed. 05/06/19   Anyanwu, Ugonna A, MD  cetirizine (ZYRTEC) 10 MG tablet Take 1 tablet (10 mg total) by mouth daily. Patient not taking: Reported on 07/14/2019 06/18/19   McClung, Angela M, PA-C  Continuous Blood Gluc Receiver (DEXCOM G6 RECEIVER) DEVI 1 Device by Does not apply route as directed. 11/27/18   Shamleffer, Ibtehal Jaralla, MD  Continuous Blood Gluc  Sensor (DEXCOM G6 SENSOR) MISC 1 Device by Does not apply route as directed. Patient not taking: Reported on 07/14/2019 11/27/18   Shamleffer, Ibtehal Jaralla, MD  Continuous Blood Gluc Transmit (DEXCOM G6 TRANSMITTER) MISC 1 Device by Does not apply route as directed. Patient not taking: Reported on 07/14/2019 11/27/18   Shamleffer, Ibtehal Jaralla, MD  Ferrous Sulfate (IRON) 325 (65 Fe) MG TABS Take 1 tablet (325 mg total) by mouth every other day. 06/17/19   Fair, Chelsea N, MD  fluticasone (FLONASE) 50 MCG/ACT nasal spray Place 1-2 sprays into both nostrils daily for 14 days. 07/23/19 08/06/19  ,  C, PA-C  Glucagon HCl 1 MG SOLR Inject 1 mg as directed as directed. 05/12/19   Shamleffer, Ibtehal Jaralla, MD  insulin aspart (NOVOLOG) cartridge Inject 5 Units into the skin daily with breakfast AND 6 Units daily with lunch AND 5 Units daily with supper. 07/06/19   Shamleffer, Ibtehal Jaralla, MD  Insulin Disposable Pump (OMNIPOD DASH 5 PACK PODS) MISC 1 Package by Does not apply route as directed. 07/07/19   Shamleffer, Ibtehal Jaralla, MD  Insulin Glargine (LANTUS) 100 UNIT/ML Solostar Pen Inject 20 Units into the skin daily. Patient taking differently: Inject 18 Units into the skin daily.  06/12/19   Shamleffer, Ibtehal Jaralla, MD  INSULIN SYRINGE .5CC/29G (SAFETY INSULIN SYRINGES) 29G X 1/2" 0.5 ML MISC 1 Device by Does not apply route 3 (three) times daily. 07/03/19   Shamleffer, Ibtehal Jaralla, MD  loperamide (IMODIUM A-D) 2 MG tablet Take 1 tablet (2 mg total) by mouth 4 (four) times daily as needed for diarrhea or loose stools. 04/01/19   Rasch, Jennifer I, NP  ondansetron (ZOFRAN ODT) 4 MG disintegrating tablet Take 1 tablet (4 mg total) by mouth every 6 (six) hours as needed for nausea. Patient not taking: Reported on 07/10/2019 05/20/19   Anyanwu, Ugonna A, MD  pantoprazole (PROTONIX) 20 MG tablet Take 1 tablet (20 mg total) by mouth daily. Can increase to bid prn 07/16/19   Pickens, Charlie, MD   Prenatal Multivit-Min-Fe-FA (PRENATAL/IRON) TABS Take 1 tablet by mouth daily. 06/30/19   Constant, Peggy, MD  Prenatal MV & Min w/FA-DHA (PRENATAL ADULT GUMMY/DHA/FA PO) Take 1 tablet by mouth.     [provider]  triamcinolone cream (KENALOG) 0.1 % Apply 1 application topically 2 (two) times daily. Use sparingly prn itching Patient not taking: Reported on 07/10/2019 06/18/19   McClung, Angela M, PA-C    Family History Family History  Problem Relation Age of Onset  . Asthma Mother   . Carpal tunnel syndrome Mother   . Gout Father   . Diabetes Paternal Grandmother   . Anesthesia problems Neg Hx     Social History Social History   Tobacco Use  . Smoking status: Never Smoker  . Smokeless tobacco: Never Used  Substance Use Topics  . Alcohol use: Not   Currently  . Drug use: No     Allergies   Penicillins, Benadryl [diphenhydramine], and Doxycycline   Review of Systems Review of Systems  Constitutional: Negative for activity change, appetite change, chills, fatigue and fever.  HENT: Positive for congestion, ear pain, hearing loss and rhinorrhea. Negative for sinus pressure, sore throat and trouble swallowing.   Eyes: Negative for discharge and redness.  Respiratory: Negative for cough, chest tightness and shortness of breath.   Cardiovascular: Negative for chest pain.  Gastrointestinal: Negative for abdominal pain, diarrhea, nausea and vomiting.  Musculoskeletal: Negative for myalgias.  Skin: Negative for rash.  Neurological: Negative for dizziness, light-headedness and headaches.     Physical Exam Triage Vital Signs ED Triage Vitals  Enc Vitals Group     BP 07/23/19 1015 130/81     Pulse Rate 07/23/19 1015 (!) 113     Resp 07/23/19 1015 16     Temp 07/23/19 1015 99.1 F (37.3 C)     Temp Source 07/23/19 1015 Oral     SpO2 07/23/19 1015 97 %     Weight 07/23/19 1016 138 lb (62.6 kg)     Height 07/23/19 1016 5' 3" (1.6 m)     Head Circumference --       Peak Flow --      Pain Score 07/23/19 1015 6     Pain Loc --      Pain Edu? --      Excl. in [place]? --    No data found.  Updated Vital Signs BP 130/81   Pulse (!) 113   Temp 99.1 F (37.3 C) (Oral)   Resp 16   Ht 5' 3" (1.6 m)   Wt 138 lb (62.6 kg)   LMP  (LMP Unknown)   SpO2 97%   BMI 24.45 kg/m   Visual Acuity Right Eye Distance:   Left Eye Distance:   Bilateral Distance:    Right Eye Near:   Left Eye Near:    Bilateral Near:     Physical Exam Vitals and nursing note reviewed.  Constitutional:      Appearance: She is well-developed.     Comments: No acute distress  HENT:     Head: Normocephalic and atraumatic.     Ears:     Comments: Right canal with cerumen impaction, TM not initially visualized, after removal, canal nonerythematous and nonswollen, TM intact with good bony landmarks, small effusion present  Left canal with partial cerumen impaction, TM partially visualized and is intact without erythema    Nose: Nose normal.     Comments: Nasal mucosa erythematous with swollen turbinates bilaterally    Mouth/Throat:     Comments: Oral mucosa pink and moist, no tonsillar enlargement or exudate. Posterior pharynx patent and nonerythematous, no uvula deviation or swelling. Normal phonation.  Eyes:     Conjunctiva/sclera: Conjunctivae normal.  Cardiovascular:     Rate and Rhythm: Normal rate.  Pulmonary:     Effort: Pulmonary effort is normal. No respiratory distress.  Abdominal:     General: There is no distension.  Musculoskeletal:        General: Normal range of motion.     Cervical back: Neck supple.  Skin:    General: Skin is warm and dry.  Neurological:     Mental Status: She is alert and oriented to person, place, and time.      UC Treatments / Results  Labs (all labs ordered are listed, but only abnormal results are  displayed) Labs Reviewed - No data to display  EKG   Radiology No results found.  Procedures Procedures (including  critical care time)  Medications Ordered in UC Medications - No data to display  Initial Impression / Assessment and Plan / UC Course  I have reviewed the triage vital signs and the nursing notes.  Pertinent labs & imaging results that were available during my care of the patient were reviewed by me and considered in my medical decision making (see chart for details).     Right cerumen impaction irrigated by nursing staff with complete resolution, improvement in hearing afterwards. Initiating on Flonase help with nasal congestion as well as any underlying eustachian tube dysfunction contributing to symptoms.  Discussed strict return precautions. Patient verbalized understanding and is agreeable with plan.  Final Clinical Impressions(s) / UC Diagnoses   Final diagnoses:  Hearing loss of right ear due to cerumen impaction  Nasal congestion     Discharge Instructions     Wax removed from right ear May use over the counter debrox to help prevent further build up Flonase nasal spray 1-2 spray each nostril daily for the next 1-2 weeks  Follow up if symptoms changing/worsening   ED Prescriptions    Medication Sig Dispense Auth. Provider   fluticasone (FLONASE) 50 MCG/ACT nasal spray Place 1-2 sprays into both nostrils daily for 14 days. 16 g ,  C, PA-C     PDMP not reviewed this encounter.   ,  C, PA-C 07/23/19 1130  

## 2019-07-23 NOTE — ED Triage Notes (Signed)
Pt states yesterday she started to not be able to hear out of right ear and slightly left ear. Pt denies dizziness.

## 2019-07-24 ENCOUNTER — Ambulatory Visit (INDEPENDENT_AMBULATORY_CARE_PROVIDER_SITE_OTHER): Payer: BLUE CROSS/BLUE SHIELD | Admitting: Internal Medicine

## 2019-07-24 ENCOUNTER — Encounter: Payer: Self-pay | Admitting: Internal Medicine

## 2019-07-24 VITALS — BP 124/82 | HR 105 | Temp 98.4°F | Ht 63.0 in | Wt 142.8 lb

## 2019-07-24 DIAGNOSIS — E1065 Type 1 diabetes mellitus with hyperglycemia: Secondary | ICD-10-CM | POA: Insufficient documentation

## 2019-07-24 DIAGNOSIS — O24012 Pre-existing diabetes mellitus, type 1, in pregnancy, second trimester: Secondary | ICD-10-CM | POA: Insufficient documentation

## 2019-07-24 LAB — BASIC METABOLIC PANEL
BUN: 12 mg/dL (ref 6–23)
CO2: 20 mEq/L (ref 19–32)
Calcium: 8.2 mg/dL — ABNORMAL LOW (ref 8.4–10.5)
Chloride: 106 mEq/L (ref 96–112)
Creatinine, Ser: 0.87 mg/dL (ref 0.40–1.20)
GFR: 94.41 mL/min (ref >60.00)
Glucose, Bld: 143 mg/dL — ABNORMAL HIGH (ref 70–99)
Potassium: 4.1 mEq/L (ref 3.5–5.1)
Sodium: 132 mEq/L — ABNORMAL LOW (ref 135–145)

## 2019-07-24 NOTE — Patient Instructions (Signed)
-   Decrease lantus to 16 units daily  - Novolog 6 units with Breakfast, 7 units with Lunch and 8 units with Supper  -Novolog correctional insulin: ADD extra units on insulin to your meal-time Novolog dose if your blood sugars are higher than 155. Use the scale below to help guide you:   Blood sugar before meal Number of units to inject  Less than 155 0 unit  156 -  210 1 units  211-  265 2 units  265 -  320 3 units  321 -  375 4 units   Pregnancy GOALS  TIMING  BLOOD SUGAR GOALS  FASTING (before breakfast)  60 - 95  BEFORE MEALS LESS THAN 100  2 hours AFTER MEAL LESS THAN 120       Use the following resources for increased carb counting accuracy:  Foods with a Nutrition Facts label: Always check serving size and total carbohydrates.  Measure food as needed to determine exact carbs in the serving you are eating (food should always be measured as it is ready to eat - for example  1/2 cup oatmeal refers to 1/2 cup cooked not 1/2 cup uncooked).  Foods without a Nutrition Facts label:  Eating out - Try using the Go Meals app to determine carb content of fast food and sit down restaurant foods

## 2019-07-24 NOTE — Progress Notes (Signed)
Name: Cherine Drumgoole  Age/ Sex: 29 y.o., female   MRN/ DOB: [id number], 1990/08/21     PCP: Charlott Rakes, MD   Reason for Endocrinology Evaluation: Type 2 Diabetes Mellitus  Initial Endocrine Consultative Visit: 06/27/18    PATIENT IDENTIFIER: Ms. Adalyna Godbee is a 29 y.o. female with a past medical history of T1DM with multiple DKA's. The patient has followed with Endocrinology clinic since 06/27/2018 for consultative assistance with management of her diabetes.  DIABETIC HISTORY:  [name] was diagnosed with T1DM at age 15. Hx of multiple DKA's due to non-compliance. Her hemoglobin A1c has ranged from  in 11.3% in 2011, peaking at 19.3% in 2015.   SUBJECTIVE:   During the last visit (07/06/2019): A1c 7.8% .  We continued  Lantus and Novolog    Today (07/24/2019): [name] is here for a follow up on diabetes management during pregnancy.She had missed an appointment last week. She is currently pregnant at 21.6 Weeks of gestation. She  checks her blood sugars 3-4 times daily, through the dexcom . The is much more comfortable with the current regimen but continues to underdose    Las eye exam 12/12/2018  She denies nausea or vomiting  Feels tired     ROS: As per HPI and as detailed below: Review of Systems  Constitutional: Negative for chills and fever.  HENT: Negative for congestion and sore throat.   Cardiovascular: Negative for chest pain and palpitations.  Gastrointestinal: Negative for diarrhea and nausea.  Neurological: Negative for tingling and tremors.       HOME DIABETES REGIMEN:   Lantus 18  units daily  Novolog 5/6/ 5 units - has been taking 7 units with supper   CF : (BG-100/60)      CONTINUOUS GLUCOSE MONITORING RECORD INTERPRETATION    Dates of Recording: 3/12-3/25/2021 Sensor description: Dexcom  Results statistics:   CGM use % of time 21  Average and SD 155/41  Time in range   70%  % Time  Above 180 28  % Time above 250 <1  % Time Below target 0     Glycemic patterns summary: hypoglycemia noted over night, BG's elevated during the day Hypoglycemic episodes occurred  During the night  Overnight periods: trending down        HISTORY:  Past Medical History:  Past Medical History:  Diagnosis Date  . Abscess, gluteal, right 08/24/2013  . AKI (acute kidney injury) ([place]) 07/26/2014  . Anemia 02/19/2012  . Bartholin's gland abscess 09/19/2013  . BV (bacterial vaginosis) 11/24/2015  . Diabetes mellitus 2001   Diagnosed at age 3 ; Type I  . Diarrhea 05/30/2016  . DKA (diabetic ketoacidoses) ([place]) 08/19/2013  . DKA (diabetic ketoacidoses) ([place]) 05/30/2016  . Gonorrhea 08/2011   Treated in 09/2011  . History of trichomoniasis 05/31/2016  . Hyperlipidemia 03/28/2016  . Sepsis ([place]) 09/19/2013   Past Surgical History:  Past Surgical History:  Procedure Laterality Date  . INCISION AND DRAINAGE PERIRECTAL ABSCESS Right 08/18/2013   Procedure: IRRIGATION AND DEBRIDEMENT GLUTEAL ABSCESS;  Surgeon: Ralene Ok, MD;  Location: [facility];  Service: General;  Laterality: Right;  . INCISION AND DRAINAGE PERIRECTAL ABSCESS Right 09/19/2013   Procedure: IRRIGATION AND DEBRIDEMENT RIGHT GLUTEAL AND LABIAL ABSCESSES;  Surgeon: Ralene Ok, MD;  Location: [facility];  Service: General;  Laterality: Right;  . INCISION AND DRAINAGE PERIRECTAL ABSCESS Right 09/24/2013   Procedure: IRRIGATION AND DEBRIDEMENT PERIRECTAL ABSCESS;  Surgeon: Gwenyth Ober, MD;  Location: [facility];  Service: General;  Laterality: Right;    Social History:  reports that she has never smoked. She has never used smokeless tobacco. She reports previous alcohol use. She reports that she does not use drugs. Family History:  Family History  Problem Relation Age of Onset  . Asthma Mother   . Carpal tunnel syndrome Mother   . Gout Father   . Diabetes Paternal Grandmother   . Anesthesia problems Neg Hx      HOME  MEDICATIONS: Allergies as of 07/24/2019      Reactions   Penicillins Hives, Rash   Has patient had a PCN reaction causing immediate rash, facial/tongue/throat swelling, SOB or lightheadedness with hypotension: Yes Has patient had a PCN reaction causing severe rash involving mucus membranes or skin necrosis: No Has patient had a PCN reaction that required hospitalization: Yes Has patient had a PCN reaction occurring within the last 10 years: No If all of the above answers are "NO", then may proceed with Cephalosporin use.   Benadryl [diphenhydramine] Itching   Doxycycline Itching      Medication List       Accurate as of July 24, 2019 10:42 AM. If you have any questions, ask your nurse or doctor.        aspirin EC 81 MG tablet Take 1 tablet (81 mg total) by mouth daily. Take after 12 weeks for prevention of preeclampsia later in pregnancy   blood glucose meter kit and supplies Kit Dispense based on patient and insurance preference. Use up to four times daily as directed. (FOR ICD-9 250.00, 250.01).   Blood Pressure Kit Devi 1 Device by Does not apply route as needed.   cetirizine 10 MG tablet Commonly known as: ZYRTEC Take 1 tablet (10 mg total) by mouth daily.   Dexcom G6 Receiver Devi 1 Device by Does not apply route as directed.   Dexcom G6 Sensor Misc 1 Device by Does not apply route as directed.   Dexcom G6 Transmitter Misc 1 Device by Does not apply route as directed.   fluticasone 50 MCG/ACT nasal spray Commonly known as: FLONASE Place 1-2 sprays into both nostrils daily for 14 days.   Glucagon HCl 1 MG Solr Inject 1 mg as directed as directed.   insulin aspart cartridge Commonly known as: NOVOLOG Inject 5 Units into the skin daily with breakfast AND 6 Units daily with lunch AND 5 Units daily with supper.   insulin glargine 100 UNIT/ML Solostar Pen Commonly known as: LANTUS Inject 20 Units into the skin daily. What changed: how much to take   INSULIN  SYRINGE .5CC/29G 29G X 1/2" 0.5 ML Misc Commonly known as: Safety Insulin Syringes 1 Device by Does not apply route 3 (three) times daily.   Iron 325 (65 Fe) MG Tabs Take 1 tablet (325 mg total) by mouth every other day.   loperamide 2 MG tablet Commonly known as: Imodium A-D Take 1 tablet (2 mg total) by mouth 4 (four) times daily as needed for diarrhea or loose stools.   OmniPod Dash 5 Pack Pods Misc 1 Package by Does not apply route as directed.   ondansetron 4 MG disintegrating tablet Commonly known as: Zofran ODT Take 1 tablet (4 mg total) by mouth every 6 (six) hours as needed for nausea.   pantoprazole 20 MG tablet Commonly known as: Protonix Take 1 tablet (20 mg total) by mouth daily. Can increase to bid prn   PRENATAL ADULT GUMMY/DHA/FA PO Take 1 tablet by mouth.  Prenatal/Iron Tabs Take 1 tablet by mouth daily.   triamcinolone cream 0.1 % Commonly known as: KENALOG Apply 1 application topically 2 (two) times daily. Use sparingly prn itching        OBJECTIVE:   PHYSICAL EXAM: VS: BP 124/82 (BP Location: Left Arm, Patient Position: Sitting, Cuff Size: Normal)   Pulse (!) 105   Temp 98.4 F (36.9 C)   Ht '5\' 3"'  (1.6 m)   Wt 142 lb 12.8 oz (64.8 kg)   LMP  (LMP Unknown)   SpO2 99%   BMI 25.30 kg/m    EXAM: General: Pt appears well and is in NAD  Lungs: Clear with good BS bilat with no rales, rhonchi, or wheezes  Heart: Auscultation: RRR   Abdomen: Gravid uterus   Extremities:  BL LE: no pretibial edema normal ROM and strength, no joint enlargement or tenderness  Mental Status: Judgment, insight: intact Mood and affect: no depression, anxiety, or agitation    DM foot exam: 04/08/2019 The skin of the feet is intact without sores or ulcerations. The pedal pulses are 2+ on right and 2+ on left. The sensation is intact to a screening 5.07, 10 gram monofilament bilaterally     DATA REVIEWED:  Lab Results  Component Value Date   HGBA1C 7.8 (H)  05/20/2019   HGBA1C 8.3 (A) 04/08/2019   HGBA1C 16.8 (H) 10/24/2018    Lab Results  Component Value Date   MICRALBCREAT 115.3 (H) 04/08/2019    Results for LERAE, LANGHAM (MRN [id number]) as of 04/08/2019 16:35  Ref. Range 04/08/2019 10:20  MICROALB/CREAT RATIO Latest Ref Range: 0.0 - 30.0 mg/g 115.3 (H)    ASSESSMENT / PLAN / RECOMMENDATIONS:   1) Type 1 Diabetes Mellitus, Poorly controlled, With microalbuminuria, During Second trimester - Most recent A1c of 7.8 %. Goal A1c < 6.5%.     - BG's have been stable , her CGM download had a lot of blank days.  - Her Inpen has malfunctioned and has been using insulin syringes.  - She continues to have occasional hypoglycemia with overcorrection.  - Fear of hypoglycemia has been a major barrier in reaching pregnancy glucose goals - She received the Omnipod pods and is schedule on Monday to meet with our CDE  -Repeat BMP today - pending      MEDICATIONS:  Decrease Lantus to 16 units ONCE daily   Increase Novolog 6 units with breakfast, 7 units with lunch, 8 units with supper   CF: Novolog (BG-100/55)   EDUCATION / INSTRUCTIONS:  BG monitoring instructions: Patient is instructed to check her blood sugars 7 times a day, before meals, 2 hr post-prandial and bedtime.  Call [facility] if: BG persistently < 70 or > 300. . I reviewed the Rule of 15 for the treatment of hypoglycemia in detail with the patient. Literature supplied.     F/U in 2 weeks     Signed electronically by: Mack Guise, MD  [facility]  [address] [place], [place] [address] Phone: [phone] FAX: [phone]   CC: Charlott Rakes, [place] [address] Phone: [phone]  Fax: [phone]  Return to Endocrinology clinic as below: Future Appointments  Date Time Provider [place]  07/28/2019 11:15 AM Aletha Halim, MD  [facility]  08/07/2019  9:30 AM [place] [facility]  08/07/2019  9:30 AM [facility] [place] 1 [facility] M[facility]-US  08/07/2019 10:30 AM Shamleffer, Melanie Crazier, MD [facility]  None

## 2019-07-27 ENCOUNTER — Encounter: Payer: BLUE CROSS/BLUE SHIELD | Attending: Internal Medicine | Admitting: Nutrition

## 2019-07-27 ENCOUNTER — Other Ambulatory Visit: Payer: Self-pay

## 2019-07-27 DIAGNOSIS — E1065 Type 1 diabetes mellitus with hyperglycemia: Secondary | ICD-10-CM | POA: Insufficient documentation

## 2019-07-27 DIAGNOSIS — O24011 Pre-existing diabetes mellitus, type 1, in pregnancy, first trimester: Secondary | ICD-10-CM

## 2019-07-27 NOTE — Progress Notes (Signed)
Patient came into the office with her lunch.  She had no insulin to take for this lunch ( a salad with chicken and dressing, and 12 ounces of 1/2 sweet tea.   She was given a syringe to take her insulin.  She says she would have taken 7u for this meal if she had pen with her today. She then took 7units.at 1:15.     She was trained on the OmniPod insulin pump.  Settings were put in per Dr. Quin Hoop orders:   Basal rate: MN: 0.55 (0.54 is not allowed with this pump), I/C ratio: 14, ISF: 60, target: 100 with correction over 100 (per Dr. Ronnie Derby order), She filled a pod with Humalog insulin and attached it to her upper outer arm.  She forgot and  took 16u of Lantus this AM, and per Dr. Arman Filter order, the pump was put in a 100% basal reduction mode until 6PM (12 since lantus injection), and a basal rate of 0.25 until MN tonight  I will call her tomorrow to have her change pump settings to 0.55 from MN-MN. She was reminded X2 that she will no longer need the Lantus insulin, and to put it somewhere where she will not take it.  She agreed to do this. She re demonstrated X3 how to give a bolus.  We did carb training.  She reports knowing how to do this, and will download the [employer] app for this, and was shown how to use [employer] in the PDM for this as well.   She was put 3 plates of food on the table, and counted carbs accuratly for each of these meals.   She is wearing a Dexcom which is  connected to the clinic.   We reviewed the checklist and she signed off as understanding all topics and had no final questions. I will call her tonight to see how she is doing.

## 2019-07-27 NOTE — Patient Instructions (Signed)
Put carbs and blood sugar readings into PDM to give a bolus before every meal and snack. Call pump [phone] number if questions

## 2019-07-28 ENCOUNTER — Telehealth (INDEPENDENT_AMBULATORY_CARE_PROVIDER_SITE_OTHER): Payer: BLUE CROSS/BLUE SHIELD | Admitting: Obstetrics and Gynecology

## 2019-07-28 ENCOUNTER — Telehealth: Payer: Self-pay | Admitting: Nutrition

## 2019-07-28 VITALS — BP 130/81

## 2019-07-28 DIAGNOSIS — O2341 Unspecified infection of urinary tract in pregnancy, first trimester: Secondary | ICD-10-CM | POA: Diagnosis not present

## 2019-07-28 DIAGNOSIS — O10919 Unspecified pre-existing hypertension complicating pregnancy, unspecified trimester: Secondary | ICD-10-CM

## 2019-07-28 DIAGNOSIS — E1065 Type 1 diabetes mellitus with hyperglycemia: Secondary | ICD-10-CM | POA: Diagnosis not present

## 2019-07-28 DIAGNOSIS — O24012 Pre-existing diabetes mellitus, type 1, in pregnancy, second trimester: Secondary | ICD-10-CM

## 2019-07-28 DIAGNOSIS — B951 Streptococcus, group B, as the cause of diseases classified elsewhere: Secondary | ICD-10-CM | POA: Diagnosis not present

## 2019-07-28 DIAGNOSIS — O24011 Pre-existing diabetes mellitus, type 1, in pregnancy, first trimester: Secondary | ICD-10-CM

## 2019-07-28 DIAGNOSIS — IMO0002 Reserved for concepts with insufficient information to code with codable children: Secondary | ICD-10-CM

## 2019-07-28 DIAGNOSIS — Z3A22 22 weeks gestation of pregnancy: Secondary | ICD-10-CM

## 2019-07-28 DIAGNOSIS — O0990 Supervision of high risk pregnancy, unspecified, unspecified trimester: Secondary | ICD-10-CM

## 2019-07-28 NOTE — Telephone Encounter (Signed)
Patient reported no difficulty sleeping with pump, and giving bolus this AM.  Says FBS was 107 this AM, and 94 now.  Denies symptoms of low blood sugar at this time.  She had no final questions for me.  She reports being very pleased with the pump.   She was told to call the office if readings drop below 70, or remain above 200.  She agreed to do this.

## 2019-07-28 NOTE — Telephone Encounter (Signed)
She was also talked through making the changes to her basal rate, because we had decreased her basal rate due to yesterday's lantus injection.   New basal rate:  MN-MN: 0.55u/hr.

## 2019-07-28 NOTE — Progress Notes (Signed)
TELEHEALTH VIRTUAL OBSTETRICS VISIT ENCOUNTER NOTE  Clinic: [facility]  I connected with Ashley Freeman on 07/28/19 at 11:15 AM EDT by telephone at home and verified that I am speaking with the correct person using two identifiers.   I discussed the limitations, risks, security and privacy concerns of performing an evaluation and management service by telephone and the availability of in person appointments. I also discussed with the patient that there may be a patient responsible charge related to this service. The patient expressed understanding and agreed to proceed.  Subjective:  Ashley Freeman is a 29 y.o. G2P0010 at [redacted]w[redacted]d being followed for ongoing prenatal care.  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  Patient reports no complaints. Reports fetal movement. Denies any contractions, bleeding or leaking of fluid.   The following portions of the patient's history were reviewed and updated as appropriate: allergies, current medications, past family history, past medical history, past social history, past surgical history and problem list.   Objective:   Vitals:   07/28/19 1107  BP: 130/81     Babyscripts Data Reviewed: yes  General:  Alert, oriented and cooperative.   Mental Status: Normal mood and affect perceived. Normal judgment and thought content.  Rest of physical exam deferred due to type of encounter  Assessment and Plan:  Pregnancy: G2P0010 at [redacted]w[redacted]d 1. Supervision of high risk pregnancy, antepartum Routine care  2. Pre-existing type 1 diabetes mellitus with hyperglycemia during pregnancy in first trimester [facility]) -patient started pump yesterday and states she's doing well with it. AM fasting was 107 and she says that average BS is 127, which I told her is great.  -has mfm growth u/s next week. Continue with qmonth growth u/s and start weekly fetal testing at 32wks -Fetal echo negative on 3/23. No need for further peds cards echos -afp negative last visit  3. Chronic hypertension during pregnancy, antepartum Doing well on no meds. Continue low dose asa  4. Pre-existing type 1 diabetes mellitus during pregnancy in second trimester  5. Diabetes mellitus type 1, uncontrolled, with complications ([place])  Preterm labor symptoms and general obstetric precautions including but not limited to vaginal bleeding, contractions, leaking of fluid and fetal movement were reviewed in detail with the patient.  I discussed the assessment and treatment plan with the patient. The patient was provided an opportunity to ask questions and all were answered. The patient agreed with the plan and demonstrated an understanding of the instructions. The patient was advised to call back or seek an in-person office evaluation/go to MAU at [facility] for any urgent or concerning symptoms. Please refer to After Visit Summary for other counseling recommendations.   I provided 10 minutes of non-face-to-face time during this encounter. The visit was conducted via MyChart-medicine  No follow-ups on file.  Future Appointments  Date Time Provider [place]  08/07/2019  9:30  AM [place] M[facility]-US  08/07/2019  9:30 AM [facility]-MFC [place] 1 WH-MFCUS MFC-US  08/07/2019 10:30 AM Shamleffer, Melanie Crazier, MD [facility] None  08/25/2019 10:30 AM Aletha Halim, MD [facility]  09/08/2019  8:45 AM Donnamae Jude, MD [facility]  will move up her next OB appt a week so she's not 3 weeks w/o an office visit.   Aletha Halim, MD [facility]

## 2019-07-30 ENCOUNTER — Other Ambulatory Visit: Payer: Self-pay | Admitting: *Deleted

## 2019-07-30 ENCOUNTER — Other Ambulatory Visit: Payer: Self-pay | Admitting: Internal Medicine

## 2019-07-30 DIAGNOSIS — N289 Disorder of kidney and ureter, unspecified: Secondary | ICD-10-CM

## 2019-07-30 DIAGNOSIS — O0990 Supervision of high risk pregnancy, unspecified, unspecified trimester: Secondary | ICD-10-CM

## 2019-07-30 DIAGNOSIS — H3500 Unspecified background retinopathy: Secondary | ICD-10-CM

## 2019-07-30 NOTE — Telephone Encounter (Signed)
FYI

## 2019-08-01 NOTE — Telephone Encounter (Signed)
Thank you so much. That's awesome

## 2019-08-03 ENCOUNTER — Telehealth: Payer: Self-pay | Admitting: Nutrition

## 2019-08-03 ENCOUNTER — Other Ambulatory Visit: Payer: Self-pay

## 2019-08-03 ENCOUNTER — Ambulatory Visit (INDEPENDENT_AMBULATORY_CARE_PROVIDER_SITE_OTHER): Payer: BLUE CROSS/BLUE SHIELD | Admitting: Cardiology

## 2019-08-03 ENCOUNTER — Encounter: Payer: Self-pay | Admitting: Cardiology

## 2019-08-03 VITALS — BP 138/90 | HR 99 | Ht 63.0 in | Wt 147.2 lb

## 2019-08-03 DIAGNOSIS — R0602 Shortness of breath: Secondary | ICD-10-CM | POA: Diagnosis not present

## 2019-08-03 DIAGNOSIS — O24012 Pre-existing diabetes mellitus, type 1, in pregnancy, second trimester: Secondary | ICD-10-CM | POA: Diagnosis not present

## 2019-08-03 DIAGNOSIS — O10919 Unspecified pre-existing hypertension complicating pregnancy, unspecified trimester: Secondary | ICD-10-CM

## 2019-08-03 DIAGNOSIS — O0990 Supervision of high risk pregnancy, unspecified, unspecified trimester: Secondary | ICD-10-CM | POA: Diagnosis not present

## 2019-08-03 DIAGNOSIS — R011 Cardiac murmur, unspecified: Secondary | ICD-10-CM

## 2019-08-03 NOTE — Patient Instructions (Addendum)
Medication Instructions:  NO CHANGES  *If you need a refill on your cardiac medications before your next appointment, please call your pharmacy*   Lab Work: NOT NEEDED  Testing/Procedures: WILL BE SCHEDULE AT [facility] Your physician has requested that you have an echocardiogram. Echocardiography is a painless test that uses sound waves to create images of your heart. It provides your doctor with information about the size and shape of your heart and how well your heart's chambers and valves are working. This procedure takes approximately one hour. There are no restrictions for this procedure.     Follow-Up: At [facility], you and your health needs are our priority.  As part of our continuing mission to provide you with exceptional heart care, we have created designated Provider Care Teams.  These Care Teams include your primary Cardiologist (physician) and Advanced Practice Providers (APPs -  Physician Assistants and Nurse Practitioners) who all work together to provide you with the care you need, when you need it.  We recommend signing up for the patient portal called "MyChart".  Sign up information is provided on this After Visit Summary.  MyChart is used to connect with patients for Virtual Visits (Telemedicine).  Patients are able to view lab/test results, encounter notes, upcoming appointments, etc.  Non-urgent messages can be sent to your provider as well.   To learn more about what you can do with MyChart, go to [email or web address].    Your next appointment:   2 month(s) June 2021  The format for your next appointment:   In Person  Provider:   Glenetta Hew, MD   Other Instructions n/a

## 2019-08-03 NOTE — Telephone Encounter (Signed)
Message left on phone to call me to do a 1 week follow-up on how she is doing on her pump.  Gave number to call me back today.

## 2019-08-03 NOTE — Progress Notes (Signed)
Primary Care Provider: Charlott Rakes, MD/Pratt, Standley Dakins, MD Cardiologist: New patient to [facility] OB/GYN: Aletha Halim, MD Endocrinology: [facility], Melanie Crazier, MD  Clinic Note: Chief Complaint  Patient presents with  . New Patient (Initial Visit)  . Hypertension    Cardiology evaluation    HPI:    Ashley Freeman is a 29 y.o. G2 P0-0-1-0 female with history of TYPE 1 DIABETES (diagnosed at age 65; multiple episodes of DKA, pen also with hypoglycemia spells), who is 6 months (23 weeks) pregnant with "borderline stage I hypertension " being seen today for BASELINE CARDIOLOGY EVALUATION at the request of Donnamae Jude, MD.  Ashley Freeman was seen by telemedicine visit by Dr. Ilda Basset for part of her "high risk "pregnancy follow-up related to diabetes type 1 and GBS+ (with history of miscarriage).  Has been noted to have elevated blood pressures and was therefore referred for cardiology evaluation given concern for possible preeclampsia.  Interestingly, at that visit, was noted to have stable chronic hypertension during pregnancy, doing well on no meds..  Recent Hospitalizations:   ER visit on July 23, 2019 for hearing loss related to cerumen impaction.  Reviewed  CV studies:    The following studies were reviewed today: (if available, images/films reviewed: From Epic Chart or Care Everywhere) . Fetal Echocardiogram July 21, 2019: Sinus rhythm with 1: 1 AV conduction.  PFO and PDA with R-L shunt.  No TR or MR.  Intact IVS.  Normal LV size and function.  Normal RV.  Normal aorta and PA.  Normal umbilical vein and artery flow as well as ductus venosus  Interval History:   Ashley Freeman presents here today really not sure why she is here besides some mention that her blood pressure is little high.  She does a family history with her maternal grandmother and maternal aunt having some type of heart disease that she is unaware of the  details. She has not had any headaches or blurred vision, dizziness or wooziness.  Not unexpectedly occasional exertional shortness of breath and some mild swelling from being pregnant, but nothing out of the ordinary.  No chest pain or pressure.  No rapid regular heartbeats palpitations.  CV Review of Symptoms (Summary): positive for - dyspnea on exertion, edema and This is only minimal, related to deconditioning and pregnancy. negative for - chest pain, edema, orthopnea, palpitations, paroxysmal nocturnal dyspnea, rapid heart rate, shortness of breath or Syncope/near syncope, TIA/amaurosis fugax.  Occasional positional dizziness but nothing significant.  No claudication.  The patient does not have symptoms concerning for COVID-19 infection (fever, chills, cough, or new shortness of breath).  The patient is practicing social distancing & Masking.    REVIEWED OF SYSTEMS   Review of Systems  Constitutional: Negative for malaise/fatigue (A little more tired than usual, but doing well) and weight loss (Expected weight gain).  HENT: Negative for congestion and nosebleeds.   Respiratory: Negative for shortness of breath.   Cardiovascular: Positive for leg swelling (Trivial since onset of second trimester).  Gastrointestinal: Negative for blood in stool and melena.  Genitourinary: Positive for frequency (She does urinate more than usual). Negative for hematuria.  Musculoskeletal: Negative for joint pain.  Neurological: Negative for dizziness (Only sometimes positional dizziness), focal weakness and weakness.  Psychiatric/Behavioral: Negative for memory loss. The patient does not have insomnia (She does wake up some due to difficulty finding a comfortable position to sleep.).     I have reviewed and (if needed) personally updated the patient's  problem list, medications, allergies, past medical and surgical history, social and family history.   PAST MEDICAL HISTORY   Past Medical History:    Diagnosis Date  . Abscess, gluteal, right 08/24/2013  . AKI (acute kidney injury) ([place]) 07/26/2014  . Anemia 02/19/2012  . Bartholin's gland abscess 09/19/2013  . BV (bacterial vaginosis) 11/24/2015  . Diabetes mellitus type I ([place]) 2001   Diagnosed at age 72 ; Type I  . Diarrhea 05/30/2016  . DKA (diabetic ketoacidoses) ([place]) 08/19/2013   Also in 2018  . Gonorrhea 08/2011   Treated in 09/2011  . History of trichomoniasis 05/31/2016  . Hyperlipidemia 03/28/2016  . Sepsis ([place]) 09/19/2013    PAST SURGICAL HISTORY   Past Surgical History:  Procedure Laterality Date  . INCISION AND DRAINAGE PERIRECTAL ABSCESS Right 08/18/2013   Procedure: IRRIGATION AND DEBRIDEMENT GLUTEAL ABSCESS;  Surgeon: Ralene Ok, MD;  Location: [place];  Service: General;  Laterality: Right;  . INCISION AND DRAINAGE PERIRECTAL ABSCESS Right 09/19/2013   Procedure: IRRIGATION AND DEBRIDEMENT RIGHT GLUTEAL AND LABIAL ABSCESSES;  Surgeon: Ralene Ok, MD;  Location: [place];  Service: General;  Laterality: Right;  . INCISION AND DRAINAGE PERIRECTAL ABSCESS Right 09/24/2013   Procedure: IRRIGATION AND DEBRIDEMENT PERIRECTAL ABSCESS;  Surgeon: Gwenyth Ober, MD;  Location: [place];  Service: General;  Laterality: Right;    MEDICATIONS/ALLERGIES   Current Meds  Medication Sig  . aspirin EC 81 MG tablet Take 1 tablet (81 mg total) by mouth daily. Take after 12 weeks for prevention of preeclampsia later in pregnancy  . blood glucose meter kit and supplies KIT Dispense based on patient and insurance preference. Use up to four times daily as directed. (FOR ICD-9 250.00, 250.01).  . Blood Pressure Monitoring (BLOOD PRESSURE KIT) DEVI 1 Device by Does not apply route as needed.  . Continuous Blood Gluc Receiver (DEXCOM G6 RECEIVER) DEVI 1 Device by Does not apply route as directed.  . Continuous Blood Gluc Sensor (DEXCOM G6 SENSOR) MISC USE AS DIRECTED. CHANGE SENSOR EVERY 10 DAYS  . Continuous Blood Gluc Transmit (DEXCOM G6  TRANSMITTER) MISC 1 Device by Does not apply route as directed.  . Ferrous Sulfate (IRON) 325 (65 Fe) MG TABS Take 1 tablet (325 mg total) by mouth every other day.  . fluticasone (FLONASE) 50 MCG/ACT nasal spray Place 1-2 sprays into both nostrils daily for 14 days.  . Glucagon HCl 1 MG SOLR Inject 1 mg as directed as directed.  . insulin aspart (NOVOLOG) cartridge Inject 5 Units into the skin daily with breakfast AND 6 Units daily with lunch AND 5 Units daily with supper.  . Insulin Disposable Pump (OMNIPOD DASH 5 PACK PODS) MISC 1 Package by Does not apply route as directed.  . INSULIN SYRINGE .5CC/29G (SAFETY INSULIN SYRINGES) 29G X 1/2" 0.5 ML MISC 1 Device by Does not apply route 3 (three) times daily.  . pantoprazole (PROTONIX) 20 MG tablet Take 1 tablet (20 mg total) by mouth daily. Can increase to bid prn  . Prenatal Multivit-Min-Fe-FA (PRENATAL/IRON) TABS Take 1 tablet by mouth daily.  . Prenatal MV & Min w/FA-DHA (PRENATAL ADULT GUMMY/DHA/FA PO) Take 1 tablet by mouth.    Current Facility-Administered Medications for the 08/03/19 encounter (Office Visit) with Leonie Man, MD  Medication  . Insulin Pen Needle (NOVOFINE) 200 each    Allergies  Allergen Reactions  . Penicillins Hives and Rash    Has patient had a PCN reaction causing immediate rash, facial/tongue/throat swelling, SOB  or lightheadedness with hypotension: Yes Has patient had a PCN reaction causing severe rash involving mucus membranes or skin necrosis: No Has patient had a PCN reaction that required hospitalization: Yes Has patient had a PCN reaction occurring within the last 10 years: No If all of the above answers are "NO", then may proceed with Cephalosporin use.  . Benadryl [Diphenhydramine] Itching  . Doxycycline Itching    SOCIAL HISTORY/FAMILY HISTORY   Social History   Tobacco Use  . Smoking status: Never Smoker  . Smokeless tobacco: Never Used  Substance Use Topics  . Alcohol use: Not Currently    . Drug use: No   Social History   Social History Narrative   Patient lives in [place] mother lives in [place].  Unemployed.  Previously worked for a IT consultant.  Completed 11 grade working on [employer]. Patient 3 brothers     OBJCTIVE -PE, EKG, labs   Wt Readings from Last 3 Encounters:  08/03/19 147 lb 3.2 oz (66.8 kg)  07/24/19 142 lb 12.8 oz (64.8 kg)  07/23/19 138 lb (62.6 kg)    Physical Exam: BP 138/90   Pulse 99   Ht _0  (1.6 m)   Wt 147 lb 3.2 oz (66.8 kg)   LMP  (LMP Unknown)   SpO2 96%   BMI 26.08 kg/m  Physical Exam  Constitutional: She is oriented to person, place, and time. She appears well-developed and well-nourished.  Healthy-appearing young woman.  No acute distress.  Well-groomed.  HENT:  Head: Normocephalic and atraumatic.  Eyes: EOM are normal.  Neck: No hepatojugular reflux and no JVD present. Carotid bruit is not present.  Cardiovascular: Normal rate, regular rhythm and intact distal pulses.  No extrasystoles are present. PMI is not displaced. Exam reveals no gallop and no friction rub.  Murmur (Soft 1/6 SEM at RUSB) heard. Pulmonary/Chest: Effort normal. No respiratory distress. She has no wheezes. She has no rales.  Abdominal: Soft. There is no abdominal tenderness.  Gravid uterus.  Musculoskeletal:        General: Edema (1+ bilateral LE) present. Normal range of motion.     Cervical back: Normal range of motion and neck supple.  Neurological: She is alert and oriented to person, place, and time.  Psychiatric: She has a normal mood and affect. Her behavior is normal. Judgment and thought content normal.     Adult ECG Report  Rate: 99;  Rhythm: normal sinus rhythm, premature ventricular contractions (PVC) and Normal axis, intervals and durations.;   Narrative Interpretation: Essentially normal EKG.  Recent Labs:    Lab Results  Component Value Date   CHOL 272 (H) 03/27/2016   HDL 47 (L) 03/27/2016   LDLCALC 192 (H) 03/27/2016    TRIG 163 (H) 03/27/2016   CHOLHDL 5.8 (H) 03/27/2016   Lab Results  Component Value Date   CREATININE 0.87 07/24/2019   BUN 12 07/24/2019   NA 132 (L) 07/24/2019   K 4.1 07/24/2019   CL 106 07/24/2019   CO2 20 07/24/2019   Lab Results  Component Value Date   TSH 4.470 05/20/2019    ASSESSMENT/PLAN    Problem List Items Addressed This Visit    Supervision of high risk pregnancy, antepartum (Chronic)    Considered high risk due to her history of type 1 diabetes.  Plan to assess cardiac function and potential evidence of hypertensive heart disease with 2D echocardiogram.      Relevant Orders   ECHOCARDIOGRAM COMPLETE   EKG 12-Lead (Completed)  Chronic hypertension during pregnancy, antepartum - Primary (Chronic)    With a blood pressure 138/90, she is in stage I hypertension based on new criteria.  However she has had visits with other providers with nonhypertensive blood pressures.  She has had no symptoms to suggest elevated hypertension such as dizziness lightheadedness, syncope near syncope, headache or dyspnea at rest.  No heart failure symptoms of PND, orthopnea edema.  I agree that would preferentially not initiate treatment for now based on no symptoms. With her high risk pregnancy status given her diabetes, would need to evaluate for any potential effect of premature CAD or hypertensive effect on the heart.  Plan: Check 2D echocardiogram as initial evaluation.. Reassess in 6 to 8weeks after echocardiogram.  This will allow [place] to reevaluate her blood pressure closer to delivery.      Relevant Orders   EKG 12-Lead (Completed)   Pre-existing type 1 diabetes mellitus during pregnancy in second trimester (Chronic)   Systolic ejection murmur    Likely related to pregnancy with flow murmur.  Confirm no true valvular lesion with 2D echo.      Relevant Orders   ECHOCARDIOGRAM COMPLETE   EKG 12-Lead (Completed)   Shortness of breath   Relevant Orders    ECHOCARDIOGRAM COMPLETE   EKG 12-Lead (Completed)       COVID-19 Education: The signs and symptoms of COVID-19 were discussed with the patient and how to seek care for testing (follow up with PCP or arrange E-visit).   The importance of social distancing was discussed today.  I spent a total of 86mnutes with the patient. >  50% of the time was spent in direct patient consultation.  Additional time spent with chart review  / charting (studies, outside notes, etc): 10 Total Time: 32 min   Current medicines are reviewed at length with the patient today.  (+/- concerns) N/A  Notice: This dictation was prepared with Dragon dictation along with smaller phrase technology. Any transcriptional errors that result from this process are unintentional and may not be corrected upon review.  Patient Instructions / Medication Changes & Studies & Tests Ordered   Patient Instructions  Medication Instructions:  NO CHANGES  *If you need a refill on your cardiac medications before your next appointment, please call your pharmacy*   Lab Work: NOT NEEDED  Testing/Procedures: WILL BE SCHEDULE AT [id number] Your physician has requested that you have an echocardiogram. Echocardiography is a painless test that uses sound waves to create images of your heart. It provides your doctor with information about the size and shape of your heart and how well your heart's chambers and valves are working. This procedure takes approximately one hour. There are no restrictions for this procedure.     Follow-Up: At [facility] you and your health needs are our priority.  As part of our continuing mission to provide you with exceptional heart care, we have created designated Provider Care Teams.  These Care Teams include your primary Cardiologist (physician) and Advanced Practice Providers (APPs -  Physician Assistants and Nurse Practitioners) who all work together to provide you with the  care you need, when you need it.  We recommend signing up for the patient portal called "MyChart".  Sign up information is provided on this After Visit Summary.  MyChart is used to connect with patients for Virtual Visits (Telemedicine).  Patients are able to view lab/test results, encounter notes, upcoming appointments, etc.  Non-urgent messages can be sent  to your provider as well.   To learn more about what you can do with MyChart, go to [email or web address].    Your next appointment:   2 month(s) June 2021  The format for your next appointment:   In Person  Provider:   Glenetta Hew, MD   Other Instructions n/a    Studies Ordered:   Orders Placed This Encounter  Procedures  . EKG 12-Lead  . ECHOCARDIOGRAM COMPLETE     Glenetta Hew, M.D., M.S. Interventional Cardiologist   Pager # [phone] Phone # [phone] [address] [place], [place] [address]   Thank you for choosing Heartcare at [facility]!!

## 2019-08-04 ENCOUNTER — Encounter: Payer: Self-pay | Admitting: Cardiology

## 2019-08-04 NOTE — Assessment & Plan Note (Signed)
Considered high risk due to her history of type 1 diabetes.  Plan to assess cardiac function and potential evidence of hypertensive heart disease with 2D echocardiogram.

## 2019-08-04 NOTE — Assessment & Plan Note (Signed)
With a blood pressure 138/90, she is in stage I hypertension based on new criteria.  However she has had visits with other providers with nonhypertensive blood pressures.  She has had no symptoms to suggest elevated hypertension such as dizziness lightheadedness, syncope near syncope, headache or dyspnea at rest.  No heart failure symptoms of PND, orthopnea edema.  I agree that would preferentially not initiate treatment for now based on no symptoms. With her high risk pregnancy status given her diabetes, would need to evaluate for any potential effect of premature CAD or hypertensive effect on the heart.  Plan: Check 2D echocardiogram as initial evaluation.. Reassess in 6 to 8weeks after echocardiogram.  This will allow [place] to reevaluate her blood pressure closer to delivery.

## 2019-08-04 NOTE — Assessment & Plan Note (Signed)
Likely related to pregnancy with flow murmur.  Confirm no true valvular lesion with 2D echo.

## 2019-08-07 ENCOUNTER — Other Ambulatory Visit: Payer: Self-pay

## 2019-08-07 ENCOUNTER — Ambulatory Visit (HOSPITAL_COMMUNITY)
Admission: RE | Admit: 2019-08-07 | Discharge: 2019-08-07 | Disposition: A | Discharge status: Home / Self Care | Payer: BLUE CROSS/BLUE SHIELD | Source: Ambulatory Visit | Attending: Obstetrics and Gynecology | Admitting: Obstetrics and Gynecology

## 2019-08-07 ENCOUNTER — Encounter (HOSPITAL_COMMUNITY): Payer: Self-pay

## 2019-08-07 ENCOUNTER — Ambulatory Visit (HOSPITAL_COMMUNITY): Payer: BLUE CROSS/BLUE SHIELD | Admitting: *Deleted

## 2019-08-07 ENCOUNTER — Ambulatory Visit: Payer: BLUE CROSS/BLUE SHIELD | Admitting: Internal Medicine

## 2019-08-07 ENCOUNTER — Other Ambulatory Visit (HOSPITAL_COMMUNITY): Payer: Self-pay | Admitting: *Deleted

## 2019-08-07 DIAGNOSIS — O10919 Unspecified pre-existing hypertension complicating pregnancy, unspecified trimester: Secondary | ICD-10-CM

## 2019-08-07 DIAGNOSIS — Z3A23 23 weeks gestation of pregnancy: Secondary | ICD-10-CM

## 2019-08-07 DIAGNOSIS — O2692 Pregnancy related conditions, unspecified, second trimester: Secondary | ICD-10-CM

## 2019-08-07 DIAGNOSIS — E1065 Type 1 diabetes mellitus with hyperglycemia: Secondary | ICD-10-CM | POA: Diagnosis present

## 2019-08-07 DIAGNOSIS — Z362 Encounter for other antenatal screening follow-up: Secondary | ICD-10-CM | POA: Diagnosis not present

## 2019-08-07 DIAGNOSIS — O24312 Unspecified pre-existing diabetes mellitus in pregnancy, second trimester: Secondary | ICD-10-CM

## 2019-08-07 DIAGNOSIS — O24011 Pre-existing diabetes mellitus, type 1, in pregnancy, first trimester: Secondary | ICD-10-CM | POA: Diagnosis present

## 2019-08-07 DIAGNOSIS — O24019 Pre-existing diabetes mellitus, type 1, in pregnancy, unspecified trimester: Secondary | ICD-10-CM | POA: Diagnosis present

## 2019-08-07 DIAGNOSIS — O0990 Supervision of high risk pregnancy, unspecified, unspecified trimester: Secondary | ICD-10-CM

## 2019-08-10 ENCOUNTER — Telehealth: Payer: Self-pay | Admitting: General Practice

## 2019-08-10 NOTE — Telephone Encounter (Signed)
Received phone call alert from [employer], patient had elevated BP of 128/92. Per chart review, patient has CHTN and isn't on medication. Reviewed with Dr Ilda Basset who states patient can continue to monitor at home, no intervention needed at this time.

## 2019-08-13 ENCOUNTER — Encounter: Payer: Self-pay | Admitting: Internal Medicine

## 2019-08-13 ENCOUNTER — Other Ambulatory Visit: Payer: Self-pay

## 2019-08-13 ENCOUNTER — Ambulatory Visit (INDEPENDENT_AMBULATORY_CARE_PROVIDER_SITE_OTHER): Payer: BLUE CROSS/BLUE SHIELD | Admitting: Internal Medicine

## 2019-08-13 VITALS — BP 138/82 | HR 110 | Temp 98.2°F | Ht 63.0 in | Wt 147.4 lb

## 2019-08-13 DIAGNOSIS — O24012 Pre-existing diabetes mellitus, type 1, in pregnancy, second trimester: Secondary | ICD-10-CM

## 2019-08-13 DIAGNOSIS — E1065 Type 1 diabetes mellitus with hyperglycemia: Secondary | ICD-10-CM | POA: Diagnosis not present

## 2019-08-13 LAB — POCT GLYCOSYLATED HEMOGLOBIN (HGB A1C): Hemoglobin A1C: 6.4 % — AB (ref 4.0–5.6)

## 2019-08-13 NOTE — Patient Instructions (Signed)
Pump   Omnipod Settings   Basal rate       0000 0.550              I:C ratio       0000-1130 1:14   1130-2200 12   2200-0000 14          Sensitivity       0000  60      Goal       0000  1000

## 2019-08-13 NOTE — Progress Notes (Signed)
Name: Ashley Freeman  Age/ Sex: 29 y.o., female   MRN/ DOB: [id number], 12/19/1990     PCP: Hoy Register, MD   Reason for Endocrinology Evaluation: Type 2 Diabetes Mellitus  Initial Endocrine Consultative Visit: 06/27/18    PATIENT IDENTIFIER: Ashley Freeman is a 29 y.o. female with a past medical history of T1DM with multiple DKA's. The patient has followed with Endocrinology clinic since 06/27/2018 for consultative assistance with management of her diabetes.  DIABETIC HISTORY:  Ashley Freeman was diagnosed with T1DM at age 43. Hx of multiple DKA's due to non-compliance. Her hemoglobin A1c has ranged from  in 11.3% in 2011, peaking at 19.3% in 2015.   SUBJECTIVE:   During the last visit (07/24/2019): A1c 7.8% .  We adjusted Lantus and Novolog    Today (08/13/2019): Ashley Freeman is here for a follow up on diabetes management during pregnancy. She is currently pregnant at 24.5 Weeks of gestation.  She has been on the Omni pod for a couple of weeks now.  She  checks her blood sugars 3-4 times daily . The is much more comfortable with the current regimen but continues to underdose    Las eye exam 12/12/2018  She denies nausea or vomiting  Feels tired     ROS: As per HPI and as detailed below: Review of Systems  HENT: Negative for congestion and sore throat.   Cardiovascular: Positive for leg swelling.  Gastrointestinal: Negative for diarrhea and nausea.  Neurological: Negative for tingling and tremors.       HOME DIABETES REGIMEN:  Novolog     This patient with type 1 diabetes is treated with Omnipod (insulin pump). During the visit the pump basal and bolus doses were reviewed including carb/insulin rations and supplemental doses. The clinical list was updated. The glucose meter download was reviewed in detail to determine if the current pump settings are providing the best glycemic control without excessive  hypoglycemia.  Pump and meter download:    Pump   Omnipod Settings   Insulin type   NOVOLOG    Basal rate       0000 0.550              I:C ratio       0000 1:14          AIT  4 hrs        Sensitivity       0000  60      Goal       0000  1000            Type & Model of Pump: Omnipod Insulin Type: Currently using Novolog.  Body mass index is 26.11 kg/m.  PUMP STATISTICS: Average BG: 164 Average Daily Carbs (g): 115  Average Total Daily Insulin: 23.6  Average Daily Basal: 12 (51 %) Average Daily Bolus: 11.6 (49 %)     METER DOWNLOAD SUMMARY: 4/13-4/15/2021 Fingerstick Blood Glucose Tests = 22 Average Number Tests/Day = 1.6 Overall Mean FS Glucose = 124   BG Ranges: Low = 70 High = 226    Hypoglycemic Events/30 Days: BG < 50 = 0 Episodes of symptomatic severe hypoglycemia = 0     HISTORY:  Past Medical History:  Past Medical History:  Diagnosis Date  . Abscess, gluteal, right 08/24/2013  . AKI (acute kidney injury) (HCC) 07/26/2014  . Anemia 02/19/2012  . Bartholin's gland abscess 09/19/2013  . BV (bacterial vaginosis) 11/24/2015  . Diabetes mellitus type I (HCC)  2001   Diagnosed at age 13 ; Type I  . Diarrhea 05/30/2016  . DKA (diabetic ketoacidoses) (HCC) 08/19/2013   Also in 2018  . Gonorrhea 08/2011   Treated in 09/2011  . History of trichomoniasis 05/31/2016  . Hyperlipidemia 03/28/2016  . Sepsis (HCC) 09/19/2013   Past Surgical History:  Past Surgical History:  Procedure Laterality Date  . INCISION AND DRAINAGE PERIRECTAL ABSCESS Right 08/18/2013   Procedure: IRRIGATION AND DEBRIDEMENT GLUTEAL ABSCESS;  Surgeon: Axel Filler, MD;  Location: [facility];  Service: General;  Laterality: Right;  . INCISION AND DRAINAGE PERIRECTAL ABSCESS Right 09/19/2013   Procedure: IRRIGATION AND DEBRIDEMENT RIGHT GLUTEAL AND LABIAL ABSCESSES;  Surgeon: Axel Filler, MD;  Location: [facility] OR;  Service: General;  Laterality: Right;  . INCISION AND  DRAINAGE PERIRECTAL ABSCESS Right 09/24/2013   Procedure: IRRIGATION AND DEBRIDEMENT PERIRECTAL ABSCESS;  Surgeon: Cherylynn Ridges, MD;  Location[facility];  Service: General;  Laterality: Right;    Social History:  reports that she has never smoked. She has never used smokeless tobacco. She reports previous alcohol use. She reports that she does not use drugs. Family History:  Family History  Problem Relation Age of Onset  . Asthma Mother   . Carpal tunnel syndrome Mother   . Gout Father   . Diabetes Paternal Grandmother   . Anesthesia problems Neg Hx      HOME MEDICATIONS: Allergies as of 08/13/2019      Reactions   Penicillins Hives, Rash   Has patient had a PCN reaction causing immediate rash, facial/tongue/throat swelling, SOB or lightheadedness with hypotension: Yes Has patient had a PCN reaction causing severe rash involving mucus membranes or skin necrosis: No Has patient had a PCN reaction that required hospitalization: Yes Has patient had a PCN reaction occurring within the last 10 years: No If all of the above answers are "NO", then may proceed with Cephalosporin use.   Benadryl [diphenhydramine] Itching   Doxycycline Itching      Medication List       Accurate as of August 13, 2019  9:45 AM. If you have any questions, ask your nurse or doctor.        aspirin EC 81 MG tablet Take 1 tablet (81 mg total) by mouth daily. Take after 12 weeks for prevention of preeclampsia later in pregnancy   blood glucose meter kit and supplies Kit Dispense based on patient and insurance preference. Use up to four times daily as directed. (FOR ICD-9 250.00, 250.01).   Blood Pressure Kit Devi 1 Device by Does not apply route as needed.   Dexcom G6 Receiver Devi 1 Device by Does not apply route as directed.   Dexcom G6 Sensor Misc USE AS DIRECTED. CHANGE SENSOR EVERY 10 DAYS   Dexcom G6 Transmitter Misc 1 Device by Does not apply route as directed.   fluticasone 50 MCG/ACT nasal  spray Commonly known as: FLONASE Place 1-2 sprays into both nostrils daily for 14 days.   Glucagon HCl 1 MG Solr Inject 1 mg as directed as directed.   insulin aspart cartridge Commonly known as: NOVOLOG Inject 5 Units into the skin daily with breakfast AND 6 Units daily with lunch AND 5 Units daily with supper.   INSULIN SYRINGE .5CC/29G 29G X 1/2" 0.5 ML Misc Commonly known as: Safety Insulin Syringes 1 Device by Does not apply route 3 (three) times daily.   Iron 325 (65 Fe) MG Tabs Take 1 tablet (325 mg total) by mouth every  other day.   OmniPod Dash 5 Pack Pods Misc 1 Package by Does not apply route as directed.   pantoprazole 20 MG tablet Commonly known as: Protonix Take 1 tablet (20 mg total) by mouth daily. Can increase to bid prn   PRENATAL ADULT GUMMY/DHA/FA PO Take 1 tablet by mouth.   Prenatal/Iron Tabs Take 1 tablet by mouth daily.        OBJECTIVE:   PHYSICAL EXAM: VS: BP 138/82 (BP Location: Right Arm, Patient Position: Sitting, Cuff Size: Large)   Pulse (!) 110   Temp 98.2 F (36.8 C)   Ht 5\' 3"  (1.6 m)   Wt 147 lb 6.4 oz (66.9 kg)   LMP  (LMP Unknown)   SpO2 98%   BMI 26.11 kg/m    EXAM: General: Pt appears well and is in NAD  Lungs: Clear with good BS bilat with no rales, rhonchi, or wheezes  Heart: Auscultation: RRR   Abdomen: Gravid uterus   Extremities: BL LE: Trace  pretibial edema   Mental Status: Judgment, insight: intact Mood and affect: no depression, anxiety, or agitation    DM foot exam: 04/08/2019 The skin of the feet is intact without sores or ulcerations. The pedal pulses are 2+ on right and 2+ on left. The sensation is intact to a screening 5.07, 10 gram monofilament bilaterally     DATA REVIEWED:  Lab Results  Component Value Date   HGBA1C 6.4 (A) 08/13/2019   HGBA1C 7.8 (H) 05/20/2019   HGBA1C 8.3 (A) 04/08/2019    Lab Results  Component Value Date   MICRALBCREAT 115.3 (H) 04/08/2019    Results for  AMARE, DINALLO (MRN [id number]) as of 04/08/2019 16:35  Ref. Range 04/08/2019 10:20  MICROALB/CREAT RATIO Latest Ref Range: 0.0 - 30.0 mg/g 115.3 (H)    ASSESSMENT / PLAN / RECOMMENDATIONS:   1) Type 1 Diabetes Mellitus, Optimally controlled, With microalbuminuria, During Second trimester - Most recent A1c of 6.4 %. Goal A1c < 6.5%.    - A1c at goal, she has been on the Omnipod for a couple of weeks, had a rocky start with hyperglycemia and a couple of time, the cannula was not in , resulting in hyperglycemia.  - I have advised her in the future if the BG does not improve within an hour of a bolus, she will need to change the site. Her BG's have been better this week.  - She has done a great work thus far and I have praised her on this and encouraged her to continue . - I am going to adjust her I:C ratio for lunch and supper as she is noted with postprandial hyperglycemia during these times.   - Fear of hypoglycemia has been a major barrier in reaching pregnancy glucose goals     MEDICATIONS: Novolog through the Omnipod  Pump   Omnipod Settings   Basal rate       0000 0.550              I:C ratio       0000-1130 1:14   1130-2200 12   2200-0000 14          Sensitivity       0000  60      Goal       0000  1000    EDUCATION / INSTRUCTIONS:  BG monitoring instructions: Patient is instructed to check her blood sugars 7 times a day, before meals, 2 hr post-prandial and bedtime.  Call  [place] [facility] if: BG persistently < 70 or > 300. . I reviewed the Rule of 15 for the treatment of hypoglycemia in detail with the patient. Literature supplied.     F/U in 4 weeks     Signed electronically by: Lyndle Herrlich, MD  [facility]  [facility] [address] [place], [place] [address] Phone: [phone] FAX: [phone]   CC: Hoy Register, MD [address] [place] [address] Phone:  [phone]  Fax: [phone]  Return to Endocrinology clinic as below: Future Appointments  Date Time Provider Department Center  08/13/2019  9:50 AM Emile Kyllo, Konrad Dolores, MD [facility] None  08/18/2019  9:45 AM [place] Bing, MD [facility]  08/18/2019  2:00 PM [facility] ECHO 4 [facility]3ECHO [facility]  09/04/2019  8:45 AM [facility]-MFC NURSE [facility]MFC [facility]-US  09/04/2019  8:45 AM [facility]-MFC [place] 2 WH-MFCUS M[facility]-US  09/08/2019  8:45 AM Reva Bores, MD [facility]  10/05/2019  3:20 PM Marykay Lex, MD [facility]

## 2019-08-14 ENCOUNTER — Encounter (HOSPITAL_COMMUNITY): Payer: Self-pay

## 2019-08-14 ENCOUNTER — Emergency Department (HOSPITAL_COMMUNITY)
Admission: EM | Admit: 2019-08-14 | Discharge: 2019-08-14 | Disposition: A | Discharge status: Home / Self Care | Payer: BLUE CROSS/BLUE SHIELD | Attending: Emergency Medicine | Admitting: Emergency Medicine

## 2019-08-14 ENCOUNTER — Other Ambulatory Visit: Payer: Self-pay

## 2019-08-14 DIAGNOSIS — Z79899 Other long term (current) drug therapy: Secondary | ICD-10-CM | POA: Insufficient documentation

## 2019-08-14 DIAGNOSIS — E109 Type 1 diabetes mellitus without complications: Secondary | ICD-10-CM | POA: Insufficient documentation

## 2019-08-14 DIAGNOSIS — O0990 Supervision of high risk pregnancy, unspecified, unspecified trimester: Secondary | ICD-10-CM

## 2019-08-14 DIAGNOSIS — E860 Dehydration: Secondary | ICD-10-CM | POA: Insufficient documentation

## 2019-08-14 DIAGNOSIS — R42 Dizziness and giddiness: Secondary | ICD-10-CM | POA: Diagnosis present

## 2019-08-14 DIAGNOSIS — E1065 Type 1 diabetes mellitus with hyperglycemia: Secondary | ICD-10-CM

## 2019-08-14 LAB — CBG MONITORING, ED: Glucose-Capillary: 149 mg/dL — ABNORMAL HIGH (ref 70–99)

## 2019-08-14 LAB — CBC
HCT: 24.9 % — ABNORMAL LOW (ref 36.0–46.0)
Hemoglobin: 8.1 g/dL — ABNORMAL LOW (ref 12.0–15.0)
MCH: 28.6 pg (ref 26.0–34.0)
MCHC: 32.5 g/dL (ref 30.0–36.0)
MCV: 88 fL (ref 80.0–100.0)
Platelets: 250 10*3/uL (ref 150–400)
RBC: 2.83 MIL/uL — ABNORMAL LOW (ref 3.87–5.11)
RDW: 13.2 % (ref 11.5–15.5)
WBC: 10.5 10*3/uL (ref 4.0–10.5)
nRBC: 0 % (ref 0.0–0.2)

## 2019-08-14 LAB — URINALYSIS, ROUTINE W REFLEX MICROSCOPIC
Bilirubin Urine: NEGATIVE
Glucose, UA: NEGATIVE mg/dL
Ketones, ur: 5 mg/dL — AB
Nitrite: NEGATIVE
Protein, ur: >=300 mg/dL — AB
Specific Gravity, Urine: 1.012 (ref 1.005–1.030)
pH: 6 (ref 5.0–8.0)

## 2019-08-14 LAB — COMPREHENSIVE METABOLIC PANEL
ALT: 23 U/L (ref 0–44)
AST: 28 U/L (ref 15–41)
Albumin: 2.2 g/dL — ABNORMAL LOW (ref 3.5–5.0)
Alkaline Phosphatase: 47 U/L (ref 38–126)
Anion gap: 7 (ref 5–15)
BUN: 18 mg/dL (ref 6–20)
CO2: 22 mmol/L (ref 22–32)
Calcium: 8.8 mg/dL — ABNORMAL LOW (ref 8.9–10.3)
Chloride: 106 mmol/L (ref 98–111)
Creatinine, Ser: 0.83 mg/dL (ref 0.44–1.00)
GFR calc Af Amer: >60 mL/min (ref >60)
GFR calc non Af Amer: >60 mL/min (ref >60)
Glucose, Bld: 137 mg/dL — ABNORMAL HIGH (ref 70–99)
Potassium: 3.8 mmol/L (ref 3.5–5.1)
Sodium: 135 mmol/L (ref 135–145)
Total Bilirubin: 0.4 mg/dL (ref 0.3–1.2)
Total Protein: 5.8 g/dL — ABNORMAL LOW (ref 6.5–8.1)

## 2019-08-14 LAB — LIPASE, BLOOD: Lipase: 16 U/L (ref 11–51)

## 2019-08-14 MED ORDER — SODIUM CHLORIDE 0.9 % IV BOLUS
1000.0000 mL | Freq: Once | INTRAVENOUS | Status: AC
  Administered 2019-08-14: 1000 mL via INTRAVENOUS

## 2019-08-14 MED ORDER — SODIUM CHLORIDE 0.9% FLUSH
3.0000 mL | Freq: Once | INTRAVENOUS | Status: AC
  Administered 2019-08-14: 3 mL via INTRAVENOUS

## 2019-08-14 MED ORDER — ONDANSETRON HCL 4 MG/2ML IJ SOLN
4.0000 mg | Freq: Once | INTRAMUSCULAR | Status: AC
  Administered 2019-08-14: 10:00:00 4 mg via INTRAVENOUS
  Filled 2019-08-14: qty 2

## 2019-08-14 NOTE — ED Provider Notes (Signed)
[facility] DEPT Provider Note   CSN: [id number] Arrival date & time: 08/14/19  8185     History Chief Complaint  Patient presents with  . Dizziness  . Emesis    Ashley Freeman is a 29 y.o. female.  The history is provided by the patient.  Emesis Severity:  Mild Timing:  Intermittent Progression:  Improving Chronicity:  New Recent urination:  Normal Relieved by:  Nothing Worsened by:  Nothing Associated symptoms: no abdominal pain (intermittent cramping ), no arthralgias, no chills, no cough, no diarrhea, no fever, no headaches, no myalgias and no sore throat   Risk factors: diabetes (type 1 diabetic) and pregnant ([redacted] weeks pregnant, dad fell on her yesterday by accident at 1 pm. Patient with no vaginal bleeding, no severe abdominal pain, no contractions, no water leaking)        Past Medical History:  Diagnosis Date  . Abscess, gluteal, right 08/24/2013  . AKI (acute kidney injury) ([place]) 07/26/2014  . Anemia 02/19/2012  . Bartholin's gland abscess 09/19/2013  . BV (bacterial vaginosis) 11/24/2015  . Diabetes mellitus type I ([place]) 2001   Diagnosed at age 43 ; Type I  . Diarrhea 05/30/2016  . DKA (diabetic ketoacidoses) ([place]) 08/19/2013   Also in 2018  . Gonorrhea 08/2011   Treated in 09/2011  . History of trichomoniasis 05/31/2016  . Hyperlipidemia 03/28/2016  . Sepsis ([place]) 09/19/2013    Patient Active Problem List   Diagnosis Date Noted  . Systolic ejection murmur 63/14/9702  . Shortness of breath 08/03/2019  . Type 1 diabetes mellitus with hyperglycemia ([place]) 07/24/2019  . Pre-existing type 1 diabetes mellitus during pregnancy in second trimester 07/24/2019  . Chronic hypertension during pregnancy, antepartum 07/14/2019  . Diabetic retinopathy ([place]) 07/02/2019  . Proteinuria due to type 1 diabetes mellitus ([place]) 05/21/2019  . Proteinuria complicating pregnancy 63/78/5885  . Supervision of high risk pregnancy, antepartum  05/06/2019  . Pre-existing type 1 diabetes mellitus during pregnancy in first trimester ([place]) 05/06/2019  . Diabetic neuropathy ([place]) 01/16/2016  . GBS (group b Streptococcus) UTI complicating pregnancy, first trimester 04/08/2014  . Diabetic ketoacidosis without coma associated with type 1 diabetes mellitus ([place]) 09/19/2013  . Diabetes mellitus type 1, uncontrolled, with complications ([place]) 02/77/4128    Past Surgical History:  Procedure Laterality Date  . INCISION AND DRAINAGE PERIRECTAL ABSCESS Right 08/18/2013   Procedure: IRRIGATION AND DEBRIDEMENT GLUTEAL ABSCESS;  Surgeon: Ralene Ok, MD;  Location: [place];  Service: General;  Laterality: Right;  . INCISION AND DRAINAGE PERIRECTAL ABSCESS Right 09/19/2013   Procedure: IRRIGATION AND DEBRIDEMENT RIGHT GLUTEAL AND LABIAL ABSCESSES;  Surgeon: Ralene Ok, MD;  Location: [place];  Service: General;  Laterality: Right;  . INCISION AND DRAINAGE PERIRECTAL ABSCESS Right 09/24/2013   Procedure: IRRIGATION AND DEBRIDEMENT PERIRECTAL ABSCESS;  Surgeon: Gwenyth Ober, MD;  Location: [place];  Service: General;  Laterality: Right;     OB History    Gravida  2   Para  0   Term      Preterm      AB  1   Living  0     SAB  1   TAB      Ectopic      Multiple      Live Births              Family History  Problem Relation Age of Onset  . Asthma Mother   . Carpal tunnel syndrome Mother   .  Gout Father   . Diabetes Paternal Grandmother   . Anesthesia problems Neg Hx     Social History   Tobacco Use  . Smoking status: Never Smoker  . Smokeless tobacco: Never Used  Substance Use Topics  . Alcohol use: Not Currently  . Drug use: No    Home Medications Prior to Admission medications   Medication Sig Start Date End Date Taking? Authorizing Provider  aspirin EC 81 MG tablet Take 1 tablet (81 mg total) by mouth daily. Take after 12 weeks for prevention of preeclampsia later in pregnancy 05/20/19   Anyanwu, Sallyanne Havers,  MD  blood glucose meter kit and supplies KIT Dispense based on patient and insurance preference. Use up to four times daily as directed. (FOR ICD-9 250.00, 250.01). 05/25/17   Isla Pence, MD  Blood Pressure Monitoring (BLOOD PRESSURE KIT) DEVI 1 Device by Does not apply route as needed. 05/06/19   Anyanwu, Sallyanne Havers, MD  Continuous Blood Gluc Receiver (DEXCOM G6 RECEIVER) DEVI 1 Device by Does not apply route as directed. 11/27/18   Shamleffer, Melanie Crazier, MD  Continuous Blood Gluc Sensor (DEXCOM G6 SENSOR) MISC USE AS DIRECTED. CHANGE SENSOR EVERY 10 DAYS 07/30/19   Shamleffer, Melanie Crazier, MD  Continuous Blood Gluc Transmit (DEXCOM G6 TRANSMITTER) MISC 1 Device by Does not apply route as directed. 11/27/18   Shamleffer, Melanie Crazier, MD  Ferrous Sulfate (IRON) 325 (65 Fe) MG TABS Take 1 tablet (325 mg total) by mouth every other day. 06/17/19   Fair, Marin Shutter, MD  fluticasone (FLONASE) 50 MCG/ACT nasal spray Place 1-2 sprays into both nostrils daily for 14 days. 07/23/19 08/06/19  Wieters, Hallie C, PA-C  Glucagon HCl 1 MG SOLR Inject 1 mg as directed as directed. 05/12/19   Shamleffer, Melanie Crazier, MD  insulin aspart (NOVOLOG) cartridge Inject 5 Units into the skin daily with breakfast AND 6 Units daily with lunch AND 5 Units daily with supper. 07/06/19   Shamleffer, Melanie Crazier, MD  Insulin Disposable Pump (OMNIPOD DASH 5 PACK PODS) MISC 1 Package by Does not apply route as directed. 07/07/19   Shamleffer, Melanie Crazier, MD  INSULIN SYRINGE .5CC/29G (SAFETY INSULIN SYRINGES) 29G X 1/2" 0.5 ML MISC 1 Device by Does not apply route 3 (three) times daily. 07/03/19   Shamleffer, Melanie Crazier, MD  pantoprazole (PROTONIX) 20 MG tablet Take 1 tablet (20 mg total) by mouth daily. Can increase to bid prn 07/16/19   Aletha Halim, MD  Prenatal Multivit-Min-Fe-FA (PRENATAL/IRON) TABS Take 1 tablet by mouth daily. 06/30/19   Constant, Peggy, MD  Prenatal MV & Min w/FA-DHA (PRENATAL ADULT  GUMMY/DHA/FA PO) Take 1 tablet by mouth.     [provider]    Allergies    Penicillins, Benadryl [diphenhydramine], and Doxycycline  Review of Systems   Review of Systems  Constitutional: Negative for chills and fever.  HENT: Negative for ear pain and sore throat.   Eyes: Negative for pain and visual disturbance.  Respiratory: Negative for cough and shortness of breath.   Cardiovascular: Negative for chest pain and palpitations.  Gastrointestinal: Positive for vomiting. Negative for abdominal pain (intermittent cramping ) and diarrhea.  Genitourinary: Negative for dysuria and hematuria.  Musculoskeletal: Negative for arthralgias, back pain and myalgias.  Skin: Negative for color change and rash.  Neurological: Positive for light-headedness. Negative for seizures, syncope and headaches.  All other systems reviewed and are negative.   Physical Exam Updated Vital Signs BP (!) 148/104 (BP Location: Left Arm)  Pulse (!) 104   Temp 98.7 F (37.1 C) (Oral)   Resp 15   Ht '5\' 3"'  (1.6 m)   Wt 66.7 kg   LMP  (LMP Unknown)   SpO2 99%   BMI 26.04 kg/m   Physical Exam Vitals and nursing note reviewed.  Constitutional:      General: She is not in acute distress.    Appearance: She is well-developed. She is not ill-appearing.  HENT:     Head: Normocephalic and atraumatic.     Mouth/Throat:     Mouth: Mucous membranes are moist.  Eyes:     Extraocular Movements: Extraocular movements intact.     Conjunctiva/sclera: Conjunctivae normal.     Pupils: Pupils are equal, round, and reactive to light.  Cardiovascular:     Rate and Rhythm: Normal rate and regular rhythm.     Pulses: Normal pulses.     Heart sounds: Normal heart sounds. No murmur.  Pulmonary:     Effort: Pulmonary effort is normal. No respiratory distress.     Breath sounds: Normal breath sounds.  Abdominal:     Palpations: Abdomen is soft.     Tenderness: There is no abdominal tenderness. There is no  guarding.  Musculoskeletal:     Cervical back: Normal range of motion and neck supple.  Skin:    General: Skin is warm and dry.     Capillary Refill: Capillary refill takes less than 2 seconds.  Neurological:     General: No focal deficit present.     Mental Status: She is alert.     ED Results / Procedures / Treatments   Labs (all labs ordered are listed, but only abnormal results are displayed) Labs Reviewed  COMPREHENSIVE METABOLIC PANEL - Abnormal; Notable for the following components:      Result Value   Glucose, Bld 137 (*)    Calcium 8.8 (*)    Total Protein 5.8 (*)    Albumin 2.2 (*)    All other components within normal limits  CBC - Abnormal; Notable for the following components:   RBC 2.83 (*)    Hemoglobin 8.1 (*)    HCT 24.9 (*)    All other components within normal limits  URINALYSIS, ROUTINE W REFLEX MICROSCOPIC - Abnormal; Notable for the following components:   APPearance HAZY (*)    Hgb urine dipstick SMALL (*)    Ketones, ur 5 (*)    Protein, ur >=300 (*)    Leukocytes,Ua SMALL (*)    Bacteria, UA FEW (*)    All other components within normal limits  CBG MONITORING, ED - Abnormal; Notable for the following components:   Glucose-Capillary 149 (*)    All other components within normal limits  URINE CULTURE  LIPASE, BLOOD    EKG None  Radiology No results found.  Procedures Procedures (including critical care time)  EMERGENCY DEPARTMENT [place] PREGNANCY "Study: Limited Ultrasound of the Pelvis for Pregnancy"  INDICATIONS:Pregnancy(required) Multiple views of the uterus and pelvic cavity were obtained in real-time with a multi-frequency probe.  APPROACH:Transabdominal  PERFORMED BY: Myself IMAGES ARCHIVED?: Yes LIMITATIONS: Body habitus PREGNANCY FREE FLUID: None INTERPRETATION: IUP, good fetal movement, good cardiac activity      Medications Ordered in ED Medications  sodium chloride flush (NS) 0.9 % injection 3 mL (3 mLs Intravenous  Given 08/14/19 0951)  sodium chloride 0.9 % bolus 1,000 mL (1,000 mLs Intravenous New Bag/Given 08/14/19 0952)  ondansetron (ZOFRAN) injection 4 mg (4 mg Intravenous Given  08/14/19 6940)    ED Course  I have reviewed the triage vital signs and the nursing notes.  Pertinent labs & imaging results that were available during my care of the patient were reviewed by me and considered in my medical decision making (see chart for details).    MDM Rules/Calculators/A&P                      Milo Solana is a 29 year old female with history of type 1 diabetes who presents to the ED with dizziness, concern for dehydration.  Patient with overall unremarkable vitals.  Patient also is [redacted] weeks pregnant.  States that her dad fell on top of her yesterday by accident about 18 hours ago.  Has had some intermittent abdominal cramping but has had that throughout her pregnancy.  States she has good fetal movement, no vaginal bleeding, no water leaking, no contractions.  Bedside ultrasound shows good fetal movement, cardiac activity, IUP.  We will have OB due to, tree.  Overall patient appears physically well.  Will check lab work for any signs of dehydration or electrolyte abnormality or DKA.  Will give IV fluid bolus, IV Zofran.  Lab work unremarkable. Patient not in DKA. Urinalysis sent for culture. Overall equivocal. No UTI symptoms. Likely contamination. Patient feeling better after IV fluids. Awaiting OB clearance and anticipate discharge to home.  OB has cleared patient.  Patient feeling better.  Discharged in good condition.  This chart was dictated using voice recognition software.  Despite best efforts to proofread,  errors can occur which can change the documentation meaning.    Final Clinical Impression(s) / ED Diagnoses Final diagnoses:  Dehydration    Rx / DC Orders ED Discharge Orders    None       Lennice Sites, DO 08/14/19 1059

## 2019-08-14 NOTE — ED Triage Notes (Signed)
Patient states she began having dizziness and vomiting last night. Patient states she is [redacted] weeks pregnant.  Patient states she wants to make sure her baby is "OK because her dad fell on top of me." Patient also c/o right mid abdominal pain.

## 2019-08-14 NOTE — Progress Notes (Signed)
IV start per ED.  Hooked up to IVF's and bolusing for uterine irritability.

## 2019-08-14 NOTE — Progress Notes (Signed)
G2P0 at 24 6/7 weeks reports to [facility] for c/o dizziness and vomiting since yesterday.  Reports FOB fell onto her stomach when he tried to get her off the couch yesterday around 1pm.  No bleeding, leaking noted.  Reports good fetal movement.  Monitors applied for NST. Dr Rip Harbour updated on pt status.  Has virtual appt with [place] office on 4/20.  Change to in person visit.

## 2019-08-14 NOTE — Progress Notes (Signed)
Dr Rip Harbour updated on reactive NST for 24 6/[redacted] weeks gestation.  UI is resolving with IVF's.  Pt states pain 0/10.  Follow up next week on 4/20 with clinic in [place].  OB cleared.

## 2019-08-15 LAB — URINE CULTURE: Culture: NO GROWTH

## 2019-08-18 ENCOUNTER — Ambulatory Visit (HOSPITAL_COMMUNITY): Payer: BLUE CROSS/BLUE SHIELD | Attending: Cardiovascular Disease

## 2019-08-18 ENCOUNTER — Ambulatory Visit (INDEPENDENT_AMBULATORY_CARE_PROVIDER_SITE_OTHER): Payer: BLUE CROSS/BLUE SHIELD | Admitting: Obstetrics and Gynecology

## 2019-08-18 ENCOUNTER — Other Ambulatory Visit: Payer: Self-pay

## 2019-08-18 VITALS — BP 137/87 | HR 118 | Wt 147.0 lb

## 2019-08-18 DIAGNOSIS — O99412 Diseases of the circulatory system complicating pregnancy, second trimester: Secondary | ICD-10-CM

## 2019-08-18 DIAGNOSIS — O24012 Pre-existing diabetes mellitus, type 1, in pregnancy, second trimester: Secondary | ICD-10-CM

## 2019-08-18 DIAGNOSIS — R011 Cardiac murmur, unspecified: Secondary | ICD-10-CM | POA: Diagnosis present

## 2019-08-18 DIAGNOSIS — E1065 Type 1 diabetes mellitus with hyperglycemia: Secondary | ICD-10-CM

## 2019-08-18 DIAGNOSIS — R0602 Shortness of breath: Secondary | ICD-10-CM | POA: Diagnosis present

## 2019-08-18 DIAGNOSIS — O0990 Supervision of high risk pregnancy, unspecified, unspecified trimester: Secondary | ICD-10-CM

## 2019-08-18 DIAGNOSIS — O10919 Unspecified pre-existing hypertension complicating pregnancy, unspecified trimester: Secondary | ICD-10-CM

## 2019-08-18 DIAGNOSIS — Z3A25 25 weeks gestation of pregnancy: Secondary | ICD-10-CM

## 2019-08-18 DIAGNOSIS — O10012 Pre-existing essential hypertension complicating pregnancy, second trimester: Secondary | ICD-10-CM

## 2019-08-18 DIAGNOSIS — O24011 Pre-existing diabetes mellitus, type 1, in pregnancy, first trimester: Secondary | ICD-10-CM

## 2019-08-18 LAB — ECHOCARDIOGRAM COMPLETE: Weight: 2352 oz

## 2019-08-18 NOTE — Progress Notes (Signed)
Prenatal Visit Note Date: 08/18/2019 Clinic: [facility]  Subjective:  Ashley Freeman is a 29 y.o. G2P0010 at [redacted]w[redacted]d being seen today for ongoing prenatal care.  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  Patient reports no complaints.   Contractions: Not present.  .  Movement: Present. Denies leaking of fluid.   The following portions of the patient's history were reviewed and updated as appropriate: allergies, current medications, past family history, past medical history, past social history, past surgical history and problem list. Problem list updated.  Objective:   Vitals:   08/18/19 0957  BP: 137/87  Pulse: (!) 118  Weight: 147 lb (66.7 kg)    Fetal Status: Fetal Heart Rate (bpm): 164   Movement: Present     General:  Alert, oriented and cooperative. Patient is in no acute distress.  Skin: Skin is warm and dry. No rash noted.   Cardiovascular: Normal heart rate noted  Respiratory: Normal respiratory effort, no problems with respiration noted  Abdomen: Soft, gravid, appropriate for gestational age. Pain/Pressure: Present     Pelvic:  Cervical exam deferred        Extremities:  Normal range of motion.  Edema: Trace  Mental Status: Normal mood and affect. Normal behavior. Normal judgment and thought content.   Urinalysis:      Assessment and Plan:  Pregnancy: G2P0010 at [redacted]w[redacted]d  1. Chronic hypertension during pregnancy, antepartum Doing well on no meds. Continue low dose ASA. Co-managed with cardiology, pt has appt with them later today  Continue with serial growth u/s with mfm  2. Pre-existing type 1 diabetes mellitus during pregnancy in first trimester ([place]) Followed by endocrine. Has omnipod and  omnipod not in right 120s am fasting 150s 2 pp prandials  3. Supervision of high risk pregnancy, antepartum 28wk labs nv. D/w her re: BC and sounds like she's interested in a BTL. Sign papers with her nv if that is what she is wanting to pursue. I d/w her re: pros/cons and procedure  Preterm labor symptoms and general obstetric precautions including but not limited to vaginal bleeding, contractions, leaking of fluid and fetal movement were reviewed in detail with the patient. Please refer to After Visit Summary for other counseling recommendations.  No follow-ups on file.  RTC 5/11   Aletha Halim, MD

## 2019-08-19 ENCOUNTER — Telehealth: Payer: Self-pay | Admitting: Nutrition

## 2019-08-19 NOTE — Telephone Encounter (Signed)
3rd message left to call me to see how she is doing with her OmniPod.

## 2019-08-24 ENCOUNTER — Telehealth: Payer: Self-pay | Admitting: Internal Medicine

## 2019-08-24 MED ORDER — INSULIN ASPART 100 UNIT/ML ~~LOC~~ SOLN: 30.0000 [IU] | SUBCUTANEOUS | 1 refills | Status: DC

## 2019-08-24 NOTE — Telephone Encounter (Signed)
Medication Refill Request  Did you call your pharmacy and request this refill first? No But she said she can . If patient has not contacted pharmacy first, instruct them to do so for future refills.  . Remind them that contacting the pharmacy for their refill is the quickest method to get the refill.  . Refill policy also stated that it will take anywhere between 24-72 hours to receive the refill.    Name of medication? novolog insulin   Is this a 90 day supply? yes  Name and location of pharmacy?  [facility]  [address] [place], [place] [address] Phone: [phone]   --->> patient also wanted to let nurse know her sugars have been going up to the 300's for the last two days. [phone]

## 2019-08-24 NOTE — Telephone Encounter (Signed)
She already has refills on this so I will reach out and let her know but please advise on the elevated blood sugars.

## 2019-08-24 NOTE — Telephone Encounter (Signed)
Attempted to call again, lft vm

## 2019-08-24 NOTE — Telephone Encounter (Signed)
Lft vm to return call

## 2019-08-25 ENCOUNTER — Telehealth: Payer: BLUE CROSS/BLUE SHIELD | Admitting: Obstetrics and Gynecology

## 2019-09-04 ENCOUNTER — Other Ambulatory Visit: Payer: Self-pay | Admitting: *Deleted

## 2019-09-04 ENCOUNTER — Other Ambulatory Visit: Payer: Self-pay

## 2019-09-04 ENCOUNTER — Ambulatory Visit: Payer: BLUE CROSS/BLUE SHIELD | Admitting: *Deleted

## 2019-09-04 ENCOUNTER — Ambulatory Visit (HOSPITAL_COMMUNITY): Payer: BLUE CROSS/BLUE SHIELD | Attending: Obstetrics and Gynecology

## 2019-09-04 DIAGNOSIS — O321XX0 Maternal care for breech presentation, not applicable or unspecified: Secondary | ICD-10-CM | POA: Diagnosis not present

## 2019-09-04 DIAGNOSIS — O10012 Pre-existing essential hypertension complicating pregnancy, second trimester: Secondary | ICD-10-CM | POA: Diagnosis not present

## 2019-09-04 DIAGNOSIS — O24312 Unspecified pre-existing diabetes mellitus in pregnancy, second trimester: Secondary | ICD-10-CM

## 2019-09-04 DIAGNOSIS — O24011 Pre-existing diabetes mellitus, type 1, in pregnancy, first trimester: Secondary | ICD-10-CM | POA: Diagnosis present

## 2019-09-04 DIAGNOSIS — O10919 Unspecified pre-existing hypertension complicating pregnancy, unspecified trimester: Secondary | ICD-10-CM | POA: Insufficient documentation

## 2019-09-04 DIAGNOSIS — O0990 Supervision of high risk pregnancy, unspecified, unspecified trimester: Secondary | ICD-10-CM

## 2019-09-04 DIAGNOSIS — E1065 Type 1 diabetes mellitus with hyperglycemia: Secondary | ICD-10-CM

## 2019-09-04 DIAGNOSIS — O2692 Pregnancy related conditions, unspecified, second trimester: Secondary | ICD-10-CM | POA: Diagnosis not present

## 2019-09-04 DIAGNOSIS — E109 Type 1 diabetes mellitus without complications: Secondary | ICD-10-CM

## 2019-09-04 DIAGNOSIS — Z3A27 27 weeks gestation of pregnancy: Secondary | ICD-10-CM

## 2019-09-08 ENCOUNTER — Ambulatory Visit (INDEPENDENT_AMBULATORY_CARE_PROVIDER_SITE_OTHER): Payer: BLUE CROSS/BLUE SHIELD | Admitting: Family Medicine

## 2019-09-08 ENCOUNTER — Other Ambulatory Visit: Payer: Self-pay

## 2019-09-08 VITALS — BP 149/98 | HR 94 | Wt 160.8 lb

## 2019-09-08 DIAGNOSIS — O10013 Pre-existing essential hypertension complicating pregnancy, third trimester: Secondary | ICD-10-CM

## 2019-09-08 DIAGNOSIS — O0993 Supervision of high risk pregnancy, unspecified, third trimester: Secondary | ICD-10-CM

## 2019-09-08 DIAGNOSIS — Z23 Encounter for immunization: Secondary | ICD-10-CM

## 2019-09-08 DIAGNOSIS — B951 Streptococcus, group B, as the cause of diseases classified elsewhere: Secondary | ICD-10-CM

## 2019-09-08 DIAGNOSIS — Z794 Long term (current) use of insulin: Secondary | ICD-10-CM

## 2019-09-08 DIAGNOSIS — O24013 Pre-existing diabetes mellitus, type 1, in pregnancy, third trimester: Secondary | ICD-10-CM

## 2019-09-08 DIAGNOSIS — Z3A28 28 weeks gestation of pregnancy: Secondary | ICD-10-CM

## 2019-09-08 DIAGNOSIS — E109 Type 1 diabetes mellitus without complications: Secondary | ICD-10-CM

## 2019-09-08 DIAGNOSIS — O10919 Unspecified pre-existing hypertension complicating pregnancy, unspecified trimester: Secondary | ICD-10-CM

## 2019-09-08 DIAGNOSIS — O2341 Unspecified infection of urinary tract in pregnancy, first trimester: Secondary | ICD-10-CM

## 2019-09-08 DIAGNOSIS — O0990 Supervision of high risk pregnancy, unspecified, unspecified trimester: Secondary | ICD-10-CM

## 2019-09-08 LAB — CBC
Hematocrit: 30.3 % — ABNORMAL LOW (ref 34.0–46.6)
Hemoglobin: 10 g/dL — ABNORMAL LOW (ref 11.1–15.9)
MCH: 28.6 pg (ref 26.6–33.0)
MCHC: 33 g/dL (ref 31.5–35.7)
MCV: 87 fL (ref 79–97)
Platelets: 302 10*3/uL (ref 150–450)
RBC: 3.5 x10E6/uL — ABNORMAL LOW (ref 3.77–5.28)
RDW: 13.5 % (ref 11.7–15.4)
WBC: 10.1 10*3/uL (ref 3.4–10.8)

## 2019-09-08 NOTE — Progress Notes (Signed)
   PRENATAL VISIT NOTE  Subjective:  Ashley Freeman is a 29 y.o. G2P0010 at [redacted]w[redacted]d being seen today for ongoing prenatal care.  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  Patient reports LE swelling.  Contractions: Not present.  .  Movement: Present. Denies leaking of fluid.   The following portions of the patient's history were reviewed and updated as appropriate: allergies, current medications, past family history, past medical history, past social history, past surgical history and problem list.   Objective:   Vitals:   09/08/19 0854  BP: (!) 149/98  Pulse: 94  Weight: 160 lb 12.8 oz (72.9 kg)    Fetal Status: Fetal Heart Rate (bpm): 143 Fundal Height: 27 cm Movement: Present     General:  Alert, oriented and cooperative. Patient is in no acute distress.  Skin: Skin is warm and dry. No rash noted.   Cardiovascular: Normal heart rate noted  Respiratory: Normal respiratory effort, no problems with respiration noted  Abdomen: Soft, gravid, appropriate for gestational age.  Pain/Pressure: Absent     Pelvic: Cervical exam deferred        Extremities: Normal range of motion.  Edema: Trace  Mental Status: Normal mood and affect. Normal behavior. Normal judgment and thought content.    Assessment and Plan:  Pregnancy: G2P0010 at [redacted]w[redacted]d 1. Chronic hypertension during pregnancy, antepartum BP is slightly up and has known CHTN. Will repeat baseline labs and Pr:Cr, which may be affecting her edema. Symptomatic treatment with elevation discusssed - Comprehensive metabolic panel - Protein / creatinine ratio, urine  2. Pre-existing type 1 diabetes mellitus in pregnancy in third trimester On Omnipod, CBGs are varied. Sees endocrinology tomorrow.--Likely needs her basal adjusted a bit - Comprehensive metabolic panel - Protein / creatinine ratio, urine  3. GBS (group b Streptococcus) UTI complicating pregnancy, first trimester Will need treatment in labor  4. Supervision of high risk pregnancy, antepartum 28 wk labs and TDaP today - RPR - CBC - HIV Antibody (routine testing w rflx)  Preterm labor symptoms and general obstetric precautions including but not limited to vaginal bleeding, contractions, leaking of fluid and fetal movement were reviewed in detail with the patient. Please refer to After Visit Summary for other counseling recommendations.   Return in 2 weeks (on 09/22/2019).  Future Appointments  Date Time Provider [place]  09/10/2019 10:10 AM Shamleffer, Melanie Crazier, MD [facility] None  09/24/2019  3:30 PM Aletha Halim, MD [facility]  10/02/2019  4:20 PM Leonie Man, MD [facility]  10/05/2019 10:00 AM [facility] NURSE [facility]  10/05/2019 10:00 AM [facility] US1 [facility]  10/12/2019 10:00 AM [facility] NURSE [facility]  10/12/2019 10:00 AM [facility] US1 [facility]    Donnamae Jude, MD

## 2019-09-08 NOTE — Patient Instructions (Addendum)
 Breastfeeding  Choosing to breastfeed is one of the best decisions you can make for yourself and your baby. A change in hormones during pregnancy causes your breasts to make breast milk in your milk-producing glands. Hormones prevent breast milk from being released before your baby is born. They also prompt milk flow after birth. Once breastfeeding has begun, thoughts of your baby, as well as his or her sucking or crying, can stimulate the release of milk from your milk-producing glands. Benefits of breastfeeding Research shows that breastfeeding offers many health benefits for infants and mothers. It also offers a cost-free and convenient way to feed your baby. For your baby  Your first milk (colostrum) helps your baby's digestive system to function better.  Special cells in your milk (antibodies) help your baby to fight off infections.  Breastfed babies are less likely to develop asthma, allergies, obesity, or type 2 diabetes. They are also at lower risk for sudden infant death syndrome (SIDS).  Nutrients in breast milk are better able to meet your baby's needs compared to infant formula.  Breast milk improves your baby's brain development. For you  Breastfeeding helps to create a very special bond between you and your baby.  Breastfeeding is convenient. Breast milk costs nothing and is always available at the correct temperature.  Breastfeeding helps to burn calories. It helps you to lose the weight that you gained during pregnancy.  Breastfeeding makes your uterus return faster to its size before pregnancy. It also slows bleeding (lochia) after you give birth.  Breastfeeding helps to lower your risk of developing type 2 diabetes, osteoporosis, rheumatoid arthritis, cardiovascular disease, and breast, ovarian, uterine, and endometrial cancer later in life. Breastfeeding basics Starting breastfeeding  Find a comfortable place to sit or lie down, with your neck and back  well-supported.  Place a pillow or a rolled-up blanket under your baby to bring him or her to the level of your breast (if you are seated). Nursing pillows are specially designed to help support your arms and your baby while you breastfeed.  Make sure that your baby's tummy (abdomen) is facing your abdomen.  Gently massage your breast. With your fingertips, massage from the outer edges of your breast inward toward the nipple. This encourages milk flow. If your milk flows slowly, you may need to continue this action during the feeding.  Support your breast with 4 fingers underneath and your thumb above your nipple (make the letter "C" with your hand). Make sure your fingers are well away from your nipple and your baby's mouth.  Stroke your baby's lips gently with your finger or nipple.  When your baby's mouth is open wide enough, quickly bring your baby to your breast, placing your entire nipple and as much of the areola as possible into your baby's mouth. The areola is the colored area around your nipple. ? More areola should be visible above your baby's upper lip than below the lower lip. ? Your baby's lips should be opened and extended outward (flanged) to ensure an adequate, comfortable latch. ? Your baby's tongue should be between his or her lower gum and your breast.  Make sure that your baby's mouth is correctly positioned around your nipple (latched). Your baby's lips should create a seal on your breast and be turned out (everted).  It is common for your baby to suck about 2-3 minutes in order to start the flow of breast milk. Latching Teaching your baby how to latch onto your breast properly   is very important. An improper latch can cause nipple pain, decreased milk supply, and poor weight gain in your baby. Also, if your baby is not latched onto your nipple properly, he or she may swallow some air during feeding. This can make your baby fussy. Burping your baby when you switch breasts  during the feeding can help to get rid of the air. However, teaching your baby to latch on properly is still the best way to prevent fussiness from swallowing air while breastfeeding. Signs that your baby has successfully latched onto your nipple  Silent tugging or silent sucking, without causing you pain. Infant's lips should be extended outward (flanged).  Swallowing heard between every 3-4 sucks once your milk has started to flow (after your let-down milk reflex occurs).  Muscle movement above and in front of his or her ears while sucking. Signs that your baby has not successfully latched onto your nipple  Sucking sounds or smacking sounds from your baby while breastfeeding.  Nipple pain. If you think your baby has not latched on correctly, slip your finger into the corner of your baby's mouth to break the suction and place it between your baby's gums. Attempt to start breastfeeding again. Signs of successful breastfeeding Signs from your baby  Your baby will gradually decrease the number of sucks or will completely stop sucking.  Your baby will fall asleep.  Your baby's body will relax.  Your baby will retain a small amount of milk in his or her mouth.  Your baby will let go of your breast by himself or herself. Signs from you  Breasts that have increased in firmness, weight, and size 1-3 hours after feeding.  Breasts that are softer immediately after breastfeeding.  Increased milk volume, as well as a change in milk consistency and color by the fifth day of breastfeeding.  Nipples that are not sore, cracked, or bleeding. Signs that your baby is getting enough milk  Wetting at least 1-2 diapers during the first 24 hours after birth.  Wetting at least 5-6 diapers every 24 hours for the first week after birth. The urine should be clear or pale yellow by the age of 5 days.  Wetting 6-8 diapers every 24 hours as your baby continues to grow and develop.  At least 3 stools in  a 24-hour period by the age of 5 days. The stool should be soft and yellow.  At least 3 stools in a 24-hour period by the age of 7 days. The stool should be seedy and yellow.  No loss of weight greater than 10% of birth weight during the first 3 days of life.  Average weight gain of 4-7 oz (113-198 g) per week after the age of 4 days.  Consistent daily weight gain by the age of 5 days, without weight loss after the age of 2 weeks. After a feeding, your baby may spit up a small amount of milk. This is normal. Breastfeeding frequency and duration Frequent feeding will help you make more milk and can prevent sore nipples and extremely full breasts (breast engorgement). Breastfeed when you feel the need to reduce the fullness of your breasts or when your baby shows signs of hunger. This is called "breastfeeding on demand." Signs that your baby is hungry include:  Increased alertness, activity, or restlessness.  Movement of the head from side to side.  Opening of the mouth when the corner of the mouth or cheek is stroked (rooting).  Increased sucking sounds, smacking lips,   cooing, sighing, or squeaking.  Hand-to-mouth movements and sucking on fingers or hands.  Fussing or crying. Avoid introducing a pacifier to your baby in the first 4-6 weeks after your baby is born. After this time, you may choose to use a pacifier. Research has shown that pacifier use during the first year of a baby's life decreases the risk of sudden infant death syndrome (SIDS). Allow your baby to feed on each breast as long as he or she wants. When your baby unlatches or falls asleep while feeding from the first breast, offer the second breast. Because newborns are often sleepy in the first few weeks of life, you may need to awaken your baby to get him or her to feed. Breastfeeding times will vary from baby to baby. However, the following rules can serve as a guide to help you make sure that your baby is properly  fed:  Newborns (babies 4 weeks of age or younger) may breastfeed every 1-3 hours.  Newborns should not go without breastfeeding for longer than 3 hours during the day or 5 hours during the night.  You should breastfeed your baby a minimum of 8 times in a 24-hour period. Breast milk pumping     Pumping and storing breast milk allows you to make sure that your baby is exclusively fed your breast milk, even at times when you are unable to breastfeed. This is especially important if you go back to work while you are still breastfeeding, or if you are not able to be present during feedings. Your lactation consultant can help you find a method of pumping that works best for you and give you guidelines about how long it is safe to store breast milk. Caring for your breasts while you breastfeed Nipples can become dry, cracked, and sore while breastfeeding. The following recommendations can help keep your breasts moisturized and healthy:  Avoid using soap on your nipples.  Wear a supportive bra designed especially for nursing. Avoid wearing underwire-style bras or extremely tight bras (sports bras).  Air-dry your nipples for 3-4 minutes after each feeding.  Use only cotton bra pads to absorb leaked breast milk. Leaking of breast milk between feedings is normal.  Use lanolin on your nipples after breastfeeding. Lanolin helps to maintain your skin's normal moisture barrier. Pure lanolin is not harmful (not toxic) to your baby. You may also hand express a few drops of breast milk and gently massage that milk into your nipples and allow the milk to air-dry. In the first few weeks after giving birth, some women experience breast engorgement. Engorgement can make your breasts feel heavy, warm, and tender to the touch. Engorgement peaks within 3-5 days after you give birth. The following recommendations can help to ease engorgement:  Completely empty your breasts while breastfeeding or pumping. You may  want to start by applying warm, moist heat (in the shower or with warm, water-soaked hand towels) just before feeding or pumping. This increases circulation and helps the milk flow. If your baby does not completely empty your breasts while breastfeeding, pump any extra milk after he or she is finished.  Apply ice packs to your breasts immediately after breastfeeding or pumping, unless this is too uncomfortable for you. To do this: ? Put ice in a plastic bag. ? Place a towel between your skin and the bag. ? Leave the ice on for 20 minutes, 2-3 times a day.  Make sure that your baby is latched on and positioned properly while breastfeeding.   If engorgement persists after 48 hours of following these recommendations, contact your health care provider or a Science writer. Overall health care recommendations while breastfeeding  Eat 3 healthy meals and 3 snacks every day. Well-nourished mothers who are breastfeeding need an additional 450-500 calories a day. You can meet this requirement by increasing the amount of a balanced diet that you eat.  Drink enough water to keep your urine pale yellow or clear.  Rest often, relax, and continue to take your prenatal vitamins to prevent fatigue, stress, and low vitamin and mineral levels in your body (nutrient deficiencies).  Do not use any products that contain nicotine or tobacco, such as cigarettes and e-cigarettes. Your baby may be harmed by chemicals from cigarettes that pass into breast milk and exposure to secondhand smoke. If you need help quitting, ask your health care provider.  Avoid alcohol.  Do not use illegal drugs or marijuana.  Talk with your health care provider before taking any medicines. These include over-the-counter and prescription medicines as well as vitamins and herbal supplements. Some medicines that may be harmful to your baby can pass through breast milk.  It is possible to become pregnant while breastfeeding. If birth  control is desired, ask your health care provider about options that will be safe while breastfeeding your baby. Where to find more information: [employer]: www.llli.org Contact a health care provider if:  You feel like you want to stop breastfeeding or have become frustrated with breastfeeding.  Your nipples are cracked or bleeding.  Your breasts are red, tender, or warm.  You have: ? Painful breasts or nipples. ? A swollen area on either breast. ? A fever or chills. ? Nausea or vomiting. ? Drainage other than breast milk from your nipples.  Your breasts do not become full before feedings by the fifth day after you give birth.  You feel sad and depressed.  Your baby is: ? Too sleepy to eat well. ? Having trouble sleeping. ? More than 33 week old and wetting fewer than 6 diapers in a 24-hour period. ? Not gaining weight by 22 days of age.  Your baby has fewer than 3 stools in a 24-hour period.  Your baby's skin or the white parts of his or her eyes become yellow. Get help right away if:  Your baby is overly tired (lethargic) and does not want to wake up and feed.  Your baby develops an unexplained fever. Summary  Breastfeeding offers many health benefits for infant and mothers.  Try to breastfeed your infant when he or she shows early signs of hunger.  Gently tickle or stroke your baby's lips with your finger or nipple to allow the baby to open his or her mouth. Bring the baby to your breast. Make sure that much of the areola is in your baby's mouth. Offer one side and burp the baby before you offer the other side.  Talk with your health care provider or lactation consultant if you have questions or you face problems as you breastfeed. This information is not intended to replace advice given to you by your health care provider. Make sure you discuss any questions you have with your health care provider. Document Revised: 07/11/2017 Document Reviewed:  05/18/2016 Elsevier Patient Education  2020 [place] [phone]) . [facility] Davy Pique, MD; Gwendlyn Deutscher, MD; Walker Kehr, MD; Andria Frames, MD; McDiarmid, MD; Dutch Quint, MD; Nori Riis, MD; Mingo Amber, [place]  [address] [place], [place] [address] o [phone] o Mon-Fri 8:30-12:30, 1:30-5:00 o Providers come to see babies at [facility] o Accepting Medicaid . [place] [facility] [place] providers who accept newborns: Dorthy Cooler, MD; Orland Mustard, MD; Stephanie Acre, MD o [place], [place], [place] [address] o [phone] o Mon-Fri 8:00-5:30 o Babies seen by providers at [facility] o Does NOT accept Medicaid o Please call early in hospitalization for appointment (limited availability)  . Mustard [place], MD o [address], [place], [place] [address] o [phone] o Mon, Tue, Thur, Fri 8:30-5:00, Wed 10:00-7:00 (closed 1-2pm) o Babies seen by [facility] providers o Accepting Medicaid . [place], MD o [place], [place], [place] [address] o [phone] o Mon-Fri 8:30-5:00, Sat 8:30-12:00 o Provider comes to see babies at [place] Medicaid o Must have been referred from current patients or contacted office prior to delivery . [facility] ([place]) Franne Forts, MD; Tamera Punt, MD; Doneen Poisson, MD; Fatima Sanger, MD; Wynetta Emery, MD; Jess Barters, MD; Tami Ribas, MD; Herbert Moors, MD; Derrell Lolling, MD; Dorothyann Peng, MD; Lucious Groves, NP; Baldo Ash, NP o [employer], [place], [place] [address] o [phone] o Mon, Tue, Thur, Fri 8:30-5:30, Wed 9:30-5:30, Sat 8:30-12:30 o Babies seen by [facility] providers o Accepting Medicaid o Only accepting infants of first-time parents or siblings of current patients [facility] discharge coordinator will make  follow-up appointment . Baltazar Najjar o [place] [address] [place], [place]  [address] o [phone]   Fax - [phone] . [facility] o [address], [place], [place]  [address] o Phone - [phone]   Fax [phone] . [place], [place], [place], [place]  [address] o [phone]  [place] [phone]) . [facility] Reginal Lutes, MD; Jacklynn Ganong, MD; Torrie Mayers, MD; MD; Rosana Hoes, MD; Servando Salina, MD; Rose Fillers, MD; Rex Kras, MD; Corinna Capra, MD; Volney American, MD; Trilby Drummer, MD; Janann Colonel, MD; Jimmye Norman, [place], [place], [place] [address] o [phone] o Mon-Fri 8:30-5:00 (extended evenings Mon-Thur as needed), Sat-Sun 10:00-1:00 o Providers come to see babies at [place] Medicaid for families of first-time babies and families with all children in the household age 63 and under. Must register with office prior to making appointment (M-F only). . [place], NP; Tomi Bamberger, MD; Redmond School, MD; [place], [place] [address] [place], [place] [address] o [phone] o Mon-Fri 8:00-5:00 o Babies seen by providers at [facility] o Does NOT accept Medicaid/Commercial Insurance Only . [facility] [place] ([facility])  Marnee Guarneri, MD; Drema Dallas, MD; Montine Circle, MD; Vilma Prader, MD; Vanita Panda, MD; Alfonso Ramus, MD; Ruthann Cancer, MD; Roxanne Mins, MD; Rosalva Ferron, MD; Polly Cobia, MD o [address] [place], [place] [address] o [phone] o Mon-Fri 8:30-5:30, Sat (Oct.-Mar.) 9:00-1:00 o Babies seen by providers at [place] [phone]) . [facility] Peers, MD; Suzan Slick, MD o [address], [place], [place] [address] o [phone] o Mon-Fri 8:30-5:00, Sat 8:30-12:00 o Providers come to see babies at [facility] o Does NOT accept Medicaid . [place] at [place], [place]; Mannie Stabile, MD; [place],  [place]; Nancy Fetter, MD; Moreen Fowler, [place], [place], [place] [address] o [phone] o Mon-Fri 8:00-5:00 o Babies seen by providers at [facility] o  Does NOT accept Medicaid o Only accepting babies of parents who are patients o Please call early in hospitalization for appointment (limited availability) . [facility] Pediatric[facility] Blanca Friend, MD; Sharlene Motts, MD; Rod Can, MD; Warner Mccreedy, NP; Sabra Heck, MD; Ermalinda Memos, MD; Sharlett Iles, NP; Aurther Loft, MD; Jerrye Beavers, MD; Marcello Moores, MD; Berline Lopes, MD; Charolette Forward, MD o [place]. [place], [place], [place] [address] o [phone] o Mon-Fri 8:00-5:00, Sat 9:00-12:00 o Providers come to see babies at [facility] o Does NOT accept [facility] [phone]) . [place] providers accepting new patients: Dayna Ramus, NP; [place], [place], [place], [place] [address] o [phone] o Mon-Fri 8:00-5:00 o Babies seen by providers at [facility] [place] o Does NOT accept Medicaid o Only accepting babies of parents who are patients o Please call early in hospitalization for appointment (limited availability) . [facility] Oswaldo Conroy, MD; Sheran Lawless, MD o [address] [place], [place] [address] o [phone] (press 1 to schedule appointment) o Mon-Fri 8:00-5:00 o Providers come to see babies at [facility] o Does NOT accept Medicaid . [facility] Jodi Mourning, MD o [address], [place], [place] [address] o [phone] o Mon-Fri 8:30-5:00 (lunch 12:30-1:00), extended hours by appointment only Wed 5:00-6:30 o Babies seen by [facility] providers o Accepting Medicaid . [place] at Evalyn Casco, MD; [place], MD; Ethlyn Gallery, MD o [place], [place], [place] [address] o [phone] o Mon-Fri 8:00-5:00 o Babies seen by [facility] providers o Does NOT accept Medicaid . Therapist, music at [place], MD; Yong Channel, MD; [place],  [address] [place], [place] [address] o [phone] o Mon-Fri 8:00-5:00 o Babies seen by [facility] providers o Does NOT accept Medicaid . [place], [place]; [place], [place]; [place], NP; Albertina Parr, MD; Frederic Jericho, MD; Ronney Lion, MD; [name] Levering, NP; Jerelene Redden, NP; Tomasita Crumble, NP; Ronelle Nigh, NP; Corinna Lines, MD; [place], MD o [address] [place], [place] [address] o [phone] o Mon-Fri 8:30-5:00, Sat 10:00-1:00 o Providers come to see babies at [facility] o Does NOT accept Medicaid o Free prenatal information session Tuesdays at 4:45pm . [facility] Porfirio Oar, MD; [place], [place]; [place], [place]; [place], [place]., [place] [address] o [phone] o Mon-Fri 7:30-5:30 o Babies seen by [facility] providers . [facility] o [address], [place], [place], [place]  [address] o [phone]   Fax - [phone]  [place] [phone] & [phone]) . [place], MD o [address] [place], [place] [address] o [phone] o Mon-Thur 8:00-6:00 o Providers come to see babies at [place] Medicaid . [place], NP; Melford Aase, MD; [place], [place]; [place], [place] [place], [place] [address] o [phone] o Mon-Thur 7:30-7:30, Fri 7:30-4:30 o Babies seen by [facility] providers o Accepting Medicaid . [facility] Nyra Jabs, MD; [name], NP; Gertie Baron, MD o [place] [address], [place], [place] [address] o [phone] o Mon-Fri 8:30-5:00, Sat 8:30-12:00 o Providers come to see babies at [place] Medicaid o Must have "Meet & Greet" appointment at office prior to delivery . [place] ([place]) Jodene Nam, MD; Juleen China, MD; Clydene Laming, [place] [address], [place],  [address] o [phone] o Mon-Wed 8:00-6:00, Thur-Fri  8:00-5:00, Sat 9:00-12:00 o Providers come to see babies at [facility] o  Does NOT accept Medicaid o Only accepting siblings of current patients . [facility] [place], [place], [place], [place]  [address] o [phone]   Fax [phone] . [place] at [place] [address], [place], [place]  [address] o [phone]   Fax - [place] [phone] & [phone]) . Therapist, music at [place], DO; [place], [address] [place], [place] [address] o [phone] o Mon-Fri 7:00-5:00 o Babies seen by [facility] providers o Does NOT accept Medicaid . [place], MD; [place], [place]; [place], [place], [place], [place] [address] o [phone] o Mon-Fri 8:00-5:00 o Babies seen by [facility] providers o Accepting Medicaid . [place], MD; [place], [place]; [place], NP; [place], [place], [place], [place] [address] o [phone] o Mon-Fri 8:00-5:00 o Babies seen by providers at [place] [phone]) . [facility] at [place], [place] o [address] [place], [place] [address] o [phone] o Mon-Fri 8:00-5:00 o Babies seen by [facility] providers o Does NOT accept Medicaid o Limited availability, please call early in hospitalization to schedule follow-up . [facility] Leilani Merl, PA; Maisie Fus, MD; [place], MD; [place], [place]; Jeannine Kitten, MD; [place], [place] [facility] [address], [place], [place] [address] o [phone] o Mon-Fri 8:30-5:00, Sat 9:00-12:00 o Babies seen by providers at [facility] o Accepting Medicaid o Please register online then schedule online or call office o [email or web address] . [place] ([place] [facility] [place]) Kristian Covey, NP; Dwyane Dee, MD; Leonidas Romberg, PA o [address] [place], [place], [place] [address] o [phone] o Mon-Fri 8:00-5:00 o Babies seen by providers at [facility] o Accepting Medicaid . [place] ([facility] [employer]) Dairl Ponder, MD; Rayvon Char, NP; Melina Modena, MD o [address] [place], [place], [place] [address] o [phone] o Mon-Fri 8:00-5:30, Sat&Sun by appointment (phones open at 8:30) o Babies seen by [facility] providers o Accepting Medicaid o Must be a first-time baby or sibling of current patient . [place], [address], [place], [place]  [address] o [phone]   Fax - [phone]  [place] [phone] & [phone]) . [place], [place]; [place], [place]; Benjamine Mola, MD; [place], [place]; Harrell Lark, MD o [address] [place], [place] [address] o [phone] o Mon-Thur 8:00-7:00, Fri 8:00-5:00, Sat 8:00-12:00, Sun 9:00-12:00 o Babies seen by [facility] providers o Accepting Medicaid . [facility], MD; Ruthann Cancer, MD; [facility], MD o [address], [place], [place] [address] o [phone] o Mon-Thur 8:00-5:00 o Babies seen by providers at [facility] o Accepting Medicaid . [facility] [place], MD; Coe[place]Goins, MD; Amedeo Plenty, MD; Bobby Rumpf, MD; [place], MD; Lavonia Drafts, MD; Ruthann Cancer, MD; Selinda Eon, MD; Audie Box, MD; Jim Like, MD; Christie Nottingham, MD; Hubbard Hartshorn, MD; Modena Nunnery, MD o [address] [place], [place] [address] o [phone] o Mon-Fri 8:00-5:30, Sat (Oct.-Mar.) 9:00-1:00 o Babies seen by providers at [facility] o Accepting Medicaid o Must fill out new patient packet, available online at [email or web address] . [facility]  [place] ([facility]) Barnabas Lister,  NP; Kenton Kingfisher, NP; Claiborne Billings, NP; Rolla Plate, MD; [place], [place]; Carola Rhine, MD; Tyron Russell, MD; Delia Chimes, NP o [address], [place], [place] [address] o [phone] o Mon-Thur 8:00-5:30, Fri 8:00-5:00 o Babies seen by providers at [place] [phone]) . [place], [place]; [place], MD; Dennard Schaumann, MD; [place], [place] o [address] [place], [place] [address] o [phone] o Mon-Fri 8:00-5:00 o Babies seen by providers at [place] [phone]) . [place] [facility] [place], [place]; Olen Pel, MD; [place], [place], [place], [place] [address] o [phone] o Mon-Fri 8:00-5:00 o Babies seen by providers at [place] o Does NOT accept Medicaid o Limited appointment availability, please call early in hospitalization  . Therapist, music at [place], [place]; [place], [address] [place], [place] [address] o [phone] o Mon-Fri 8:00-5:00 o Babies seen by [facility] providers o Does NOT accept Medicaid . [facility] [place], MD; Guy Sandifer, MD; [place], [place]; [place], [place] [address], [place], [place] [address] o [phone] o Mon-Fri 8:00-5:00 o After hours clinic [facility] [place], [place] [address]) [phone] Mon-Fri 5:00-8:00, Sat 12:00-6:00, Sun 10:00-4:00 o Babies seen by [facility] providers o Accepting Medicaid . [place] at [facility] o [address] [place], [place]  [address] o [phone]   Fax - [phone]  [place] [phone]) . Therapist, music at [facility], MD o [address] [place], [place], [place] [address] o [phone] o Mon-Fri 8:00-5:00 o Babies seen by [facility] providers o Does NOT accept Medicaid . [place] ([place] [facility] [place]) Bing Neighbors, MD o [address], [place],  [address] o [phone] o Mon-Thur 8:00-7:00, Fri 8:00-5:00, Sat 8:00-12:00 o Babies seen by providers at [facility] o Accepting Medicaid - but does not have vaccinations in office (must be received elsewhere) o Limited availability, please call early in hospitalization  [place] ([phone]) . [place], [place] [address] [place] [address] o [phone]  Fax [phone]

## 2019-09-09 ENCOUNTER — Encounter: Payer: BLUE CROSS/BLUE SHIELD | Admitting: Advanced Practice Midwife

## 2019-09-09 LAB — COMPREHENSIVE METABOLIC PANEL
ALT: 36 IU/L — ABNORMAL HIGH (ref 0–32)
AST: 42 IU/L — ABNORMAL HIGH (ref 0–40)
Albumin/Globulin Ratio: 1 — ABNORMAL LOW (ref 1.2–2.2)
Albumin: 2.9 g/dL — ABNORMAL LOW (ref 3.9–5.0)
Alkaline Phosphatase: 105 IU/L (ref 39–117)
BUN/Creatinine Ratio: 10 (ref 9–23)
BUN: 9 mg/dL (ref 6–20)
Bilirubin Total: <0.2 mg/dL (ref 0.0–1.2)
CO2: 15 mmol/L — ABNORMAL LOW (ref 20–29)
Calcium: 8.3 mg/dL — ABNORMAL LOW (ref 8.7–10.2)
Chloride: 112 mmol/L — ABNORMAL HIGH (ref 96–106)
Creatinine, Ser: 0.9 mg/dL (ref 0.57–1.00)
GFR calc Af Amer: 101 mL/min/{1.73_m2} (ref >59)
GFR calc non Af Amer: 88 mL/min/{1.73_m2} (ref >59)
Globulin, Total: 3 g/dL (ref 1.5–4.5)
Glucose: 41 mg/dL — ABNORMAL LOW (ref 65–99)
Potassium: 4.2 mmol/L (ref 3.5–5.2)
Sodium: 140 mmol/L (ref 134–144)
Total Protein: 5.9 g/dL — ABNORMAL LOW (ref 6.0–8.5)

## 2019-09-09 LAB — PROTEIN / CREATININE RATIO, URINE
Creatinine, Urine: 72.9 mg/dL
Protein, Ur: 526.9 mg/dL
Protein/Creat Ratio: 7228 mg/g creat — ABNORMAL HIGH (ref 0–200)

## 2019-09-09 LAB — RPR: RPR Ser Ql: NONREACTIVE

## 2019-09-09 LAB — HIV ANTIBODY (ROUTINE TESTING W REFLEX): HIV Screen 4th Generation wRfx: NONREACTIVE

## 2019-09-09 NOTE — Progress Notes (Signed)
Name: Ashley Freeman  Age/ Sex: 29 y.o., female   MRN/ DOB: [id number], Nov 16, 1990     PCP: Charlott Rakes, MD   Reason for Endocrinology Evaluation: Type 2 Diabetes Mellitus  Initial Endocrine Consultative Visit: 06/27/18    PATIENT IDENTIFIER: Ashley Freeman is a 29 y.o. female with a past medical history of T1DM with multiple DKA's. The patient has followed with Endocrinology clinic since 06/27/2018 for consultative assistance with management of her diabetes.  DIABETIC HISTORY:  Ashley Freeman was diagnosed with T1DM at age 15. Hx of multiple DKA's due to non-compliance. Her hemoglobin A1c has ranged from  in 11.3% in 2011, peaking at 19.3% in 2015.   SUBJECTIVE:   During the last visit (08/13/2019): A1c 6.4% .  We adjusted pump settings  Today (09/10/2019): Ashley Freeman is here for a follow up on diabetes management during pregnancy. She is currently pregnant at 6.5 Weeks of gestation. She presented to the ED with dizziness In April,2012,  BG was 41 mg/dL at the time, AG slightly elevated at 13, low Bicarb 18 mEq/L . She  checks her blood sugars 3-4 times daily .  [place] eye exam 12/12/2018  She denies nausea or vomiting but is having       ROS: As per HPI and as detailed below: Review of Systems  HENT: Negative for congestion and sore throat.   Cardiovascular: Positive for leg swelling.  Gastrointestinal: Negative for diarrhea and nausea.  Neurological: Negative for tingling and tremors.       HOME DIABETES REGIMEN:  Novolog     This patient with type 1 diabetes is treated with Omnipod (insulin pump). During the visit the pump basal and bolus doses were reviewed including carb/insulin rations and supplemental doses. The clinical list was updated. The glucose meter download was reviewed in detail to determine if the current pump settings are providing the best glycemic control without excessive hypoglycemia.  Pump and meter  download:    Pump   Omnipod Settings   Insulin type   NOVOLOG    Basal rate       0000 0.550              I:C ratio       0000-1130 1:14   1130-2200 1:12   2200-0000 1:14       AIT  4 hrs        Sensitivity       0000  60      Goal       0000  100             Type & Model of Pump: Omnipod Insulin Type: Currently using Novolog.  Body mass index is 28.24 kg/m.  PUMP STATISTICS: Average BG: 170 Average Daily Carbs (g): 99 Average Total Daily Insulin: 24.8 Average Daily Basal: 12.5 (51 %) Average Daily Bolus: 12.2 (49 %)      CONTINUOUS GLUCOSE MONITORING RECORD INTERPRETATION    Dates of Recording: 4/30- 09/10/2019  Sensor description: Dexcom  Results statistics:   CGM use % of time 57  Average and SD 144/38  Time in range  81      %  % Time Above 180 17  % Time above 250 <1  % Time Below target <1   Glycemic patterns summary: Around noon , otherwise BG's BG's < 180 mg/dL   Hyperglycemic episodes  Overnight and post prandial   Hypoglycemic episodes occurred rarely   Overnight periods: high and  stable          HISTORY:  Past Medical History:  Past Medical History:  Diagnosis Date  . Abscess, gluteal, right 08/24/2013  . AKI (acute kidney injury) ([place]) 07/26/2014  . Anemia 02/19/2012  . Bartholin's gland abscess 09/19/2013  . BV (bacterial vaginosis) 11/24/2015  . Diabetes mellitus type I ([place]) 2001   Diagnosed at age 82 ; Type I  . Diarrhea 05/30/2016  . DKA (diabetic ketoacidoses) ([place]) 08/19/2013   Also in 2018  . Gonorrhea 08/2011   Treated in 09/2011  . History of trichomoniasis 05/31/2016  . Hyperlipidemia 03/28/2016  . Sepsis ([place]) 09/19/2013   Past Surgical History:  Past Surgical History:  Procedure Laterality Date  . INCISION AND DRAINAGE PERIRECTAL ABSCESS Right 08/18/2013   Procedure: IRRIGATION AND DEBRIDEMENT GLUTEAL ABSCESS;  Surgeon: Ralene Ok, MD;  Location: [place];  Service: General;  Laterality: Right;  .  INCISION AND DRAINAGE PERIRECTAL ABSCESS Right 09/19/2013   Procedure: IRRIGATION AND DEBRIDEMENT RIGHT GLUTEAL AND LABIAL ABSCESSES;  Surgeon: Ralene Ok, MD;  Location: [place];  Service: General;  Laterality: Right;  . INCISION AND DRAINAGE PERIRECTAL ABSCESS Right 09/24/2013   Procedure: IRRIGATION AND DEBRIDEMENT PERIRECTAL ABSCESS;  Surgeon: Gwenyth Ober, MD;  Location: [place];  Service: General;  Laterality: Right;    Social History:  reports that she has never smoked. She has never used smokeless tobacco. She reports previous alcohol use. She reports that she does not use drugs. Family History:  Family History  Problem Relation Age of Onset  . Asthma Mother   . Carpal tunnel syndrome Mother   . Gout Father   . Diabetes Paternal Grandmother   . Anesthesia problems Neg Hx      HOME MEDICATIONS: Allergies as of 09/10/2019      Reactions   Penicillins Hives, Rash   Has patient had a PCN reaction causing immediate rash, facial/tongue/throat swelling, SOB or lightheadedness with hypotension: Yes Has patient had a PCN reaction causing severe rash involving mucus membranes or skin necrosis: No Has patient had a PCN reaction that required hospitalization: Yes Has patient had a PCN reaction occurring within the last 10 years: No If all of the above answers are "NO", then may proceed with Cephalosporin use.   Benadryl [diphenhydramine] Itching   Doxycycline Itching      Medication List       Accurate as of Sep 10, 2019  1:12 PM. If you have any questions, ask your nurse or doctor.        aspirin EC 81 MG tablet Take 1 tablet (81 mg total) by mouth daily. Take after 12 weeks for prevention of preeclampsia later in pregnancy   blood glucose meter kit and supplies Kit Dispense based on patient and insurance preference. Use up to four times daily as directed. (FOR ICD-9 250.00, 250.01).   Blood Pressure Kit Devi 1 Device by Does not apply route as needed.   Dexcom G6 Receiver  Devi 1 Device by Does not apply route as directed.   Dexcom G6 Sensor Misc USE AS DIRECTED. CHANGE SENSOR EVERY 10 DAYS   Dexcom G6 Transmitter Misc 1 Device by Does not apply route as directed.   fluticasone 50 MCG/ACT nasal spray Commonly known as: FLONASE Place 1-2 sprays into both nostrils daily for 14 days.   Glucagon HCl 1 MG Solr Inject 1 mg as directed as directed.   insulin aspart 100 UNIT/ML injection Commonly known as: NovoLOG Inject 30 Units into the  skin as directed. Per pump settings   INSULIN SYRINGE .5CC/29G 29G X 1/2" 0.5 ML Misc Commonly known as: Safety Insulin Syringes 1 Device by Does not apply route 3 (three) times daily.   Iron 325 (65 Fe) MG Tabs Take 1 tablet (325 mg total) by mouth every other day.   OmniPod Dash 5 Pack Pods Misc 1 Package by Does not apply route as directed.   pantoprazole 20 MG tablet Commonly known as: Protonix Take 1 tablet (20 mg total) by mouth daily. Can increase to bid prn   PrePLUS 27-1 MG Tabs Take 1 tablet by mouth daily.        OBJECTIVE:   PHYSICAL EXAM: VS: BP 138/83 (BP Location: Right Arm, Patient Position: Sitting, Cuff Size: Normal)   Pulse (!) 111   Temp 98.2 F (36.8 C)   Ht '5\' 3"'  (1.6 m)   Wt 159 lb 6.4 oz (72.3 kg)   LMP  (LMP Unknown)   SpO2 99%   BMI 28.24 kg/m    EXAM: General: Pt appears well and is in NAD  Lungs: Clear with good BS bilat with no rales, rhonchi, or wheezes  Heart: Auscultation: RRR   Extremities: BL LE: 1+  pretibial edema   Mental Status: Judgment, insight: intact Mood and affect: no depression, anxiety, or agitation     DM foot exam: 04/08/2019 The skin of the feet is intact without sores or ulcerations. The pedal pulses are 2+ on right and 2+ on left. The sensation is intact to a screening 5.07, 10 gram monofilament bilaterally     DATA REVIEWED:  Lab Results  Component Value Date   HGBA1C 6.4 (A) 08/13/2019   HGBA1C 7.8 (H) 05/20/2019   HGBA1C 8.3 (A)  04/08/2019   Results for ALEIYA, RYE (MRN [id number]) as of 09/10/2019 13:31  Ref. Range 09/10/2019 10:50  Sodium Latest Ref Range: 135 - 145 mEq/L 133 (L)  Potassium Latest Ref Range: 3.5 - 5.1 mEq/L 4.4  Chloride Latest Ref Range: 96 - 112 mEq/L 109  CO2 Latest Ref Range: 19 - 32 mEq/L 18 (L)  Glucose Latest Ref Range: 70 - 99 mg/dL 194 (H)  BUN Latest Ref Range: 6 - 23 mg/dL 12  Creatinine Latest Ref Range: 0.40 - 1.20 mg/dL 1.00  Calcium Latest Ref Range: 8.4 - 10.5 mg/dL 7.9 (L)   Lab Results  Component Value Date   MICRALBCREAT 115.3 (H) 04/08/2019    Results for KORAL, THADEN (MRN [id number]) as of 04/08/2019 16:35  Ref. Range 04/08/2019 10:20  MICROALB/CREAT RATIO Latest Ref Range: 0.0 - 30.0 mg/g 115.3 (H)    ASSESSMENT / PLAN / RECOMMENDATIONS:   1) Type 1 Diabetes Mellitus, Optimally controlled, With microalbuminuria, During third trimester - Most recent A1c of 6.4 %. Goal A1c < 6.5%.    - In review of her CGM and Omnipod downloads she continues with hyperglycemia  - Pt presented with hypoglycemia on the 5/11th  , she does not recall the circumstances of this , but in review of her Omnipod download, no evidence of hypoglycemia was documented, her Dexcom sensor has been malfunctioning  - Her fasting BG's tend to trend upwards, will adjust basal rate overnight, she was noted with the occasional hyperglycemia during the day, will adjust I:C ratio   - AG has closed on today's BMP, her calcium is low but this is due to low albumin.     MEDICATIONS: Novolog through the [facility]  Basal rate       0000-0800 0.600   0800-0000 0.550          I:C ratio       0000 12          Sensitivity       0000  65      Goal       0000  100    EDUCATION / INSTRUCTIONS:  BG monitoring instructions: Patient is instructed to check her blood sugars 7 times a day, before meals, 2 hr post-prandial and bedtime.  Call [place]  Endocrinology clinic if: BG persistently < 70 or > 300. . I reviewed the Rule of 15 for the treatment of hypoglycemia in detail with the patient. Literature supplied.     F/U in 4 weeks     Signed electronically by: Mack Guise, MD  [facility] Endocrinology  [address] [place], [place] [address] Phone: [phone] FAX: [phone]   CC: Charlott Rakes, [place] [address] Phone: [phone]  Fax: [phone]  Return to Endocrinology clinic as below: Future Appointments  Date Time Provider [place]  09/24/2019  3:30 PM Aletha Halim, MD [facility]  10/02/2019  4:20 PM Leonie Man, MD [facility]  10/05/2019 10:00 AM [facility] NURSE [facility]  10/05/2019 10:00 AM [facility] US1 [facility]  10/09/2019 10:10 AM Shamleffer, Melanie Crazier, MD [facility] None  10/12/2019 10:00 AM [facility] NURSE [facility]  10/12/2019 10:00 AM [facility] US1 [facility]

## 2019-09-10 ENCOUNTER — Other Ambulatory Visit: Payer: Self-pay

## 2019-09-10 ENCOUNTER — Encounter: Payer: Self-pay | Admitting: Internal Medicine

## 2019-09-10 ENCOUNTER — Ambulatory Visit (INDEPENDENT_AMBULATORY_CARE_PROVIDER_SITE_OTHER): Payer: BLUE CROSS/BLUE SHIELD | Admitting: Internal Medicine

## 2019-09-10 VITALS — BP 138/83 | HR 111 | Temp 98.2°F | Ht 63.0 in | Wt 159.4 lb

## 2019-09-10 DIAGNOSIS — O24019 Pre-existing diabetes mellitus, type 1, in pregnancy, unspecified trimester: Secondary | ICD-10-CM | POA: Diagnosis not present

## 2019-09-10 LAB — BASIC METABOLIC PANEL
BUN: 12 mg/dL (ref 6–23)
CO2: 18 mEq/L — ABNORMAL LOW (ref 19–32)
Calcium: 7.9 mg/dL — ABNORMAL LOW (ref 8.4–10.5)
Chloride: 109 mEq/L (ref 96–112)
Creatinine, Ser: 1 mg/dL (ref 0.40–1.20)
GFR: 80.31 mL/min (ref >60.00)
Glucose, Bld: 194 mg/dL — ABNORMAL HIGH (ref 70–99)
Potassium: 4.4 mEq/L (ref 3.5–5.1)
Sodium: 133 mEq/L — ABNORMAL LOW (ref 135–145)

## 2019-09-10 NOTE — Patient Instructions (Addendum)
Pump   Omnipod Settings   Basal rate       0000-0800 0.600   0800-0000 0.550          I:C ratio       0000 12                  Sensitivity       0000  65      Goal       0000  100

## 2019-09-14 ENCOUNTER — Other Ambulatory Visit: Payer: Self-pay | Admitting: Internal Medicine

## 2019-09-14 ENCOUNTER — Ambulatory Visit (HOSPITAL_COMMUNITY)
Admission: EM | Admit: 2019-09-14 | Discharge: 2019-09-14 | Disposition: A | Discharge status: Home / Self Care | Payer: BLUE CROSS/BLUE SHIELD | Attending: Internal Medicine | Admitting: Internal Medicine

## 2019-09-14 ENCOUNTER — Encounter (HOSPITAL_COMMUNITY): Payer: Self-pay

## 2019-09-14 ENCOUNTER — Other Ambulatory Visit: Payer: Self-pay

## 2019-09-14 DIAGNOSIS — Z202 Contact with and (suspected) exposure to infections with a predominantly sexual mode of transmission: Secondary | ICD-10-CM | POA: Insufficient documentation

## 2019-09-14 LAB — POCT URINALYSIS DIP (DEVICE)
Bilirubin Urine: NEGATIVE
Glucose, UA: NEGATIVE mg/dL
Ketones, ur: NEGATIVE mg/dL
Leukocytes,Ua: NEGATIVE
Nitrite: NEGATIVE
Protein, ur: >=300 mg/dL — AB
Specific Gravity, Urine: 1.02 (ref 1.005–1.030)
Urobilinogen, UA: 0.2 mg/dL (ref 0.0–1.0)
pH: 7 (ref 5.0–8.0)

## 2019-09-14 MED ORDER — METRONIDAZOLE 500 MG PO TABS: 500.0000 mg | ORAL_TABLET | Freq: Two times a day (BID) | ORAL | 0 refills | Status: DC

## 2019-09-14 NOTE — ED Provider Notes (Signed)
[place]    CSN: [id number] Arrival date & time: 09/14/19  1618      History   Chief Complaint Chief Complaint  Patient presents with  . Exposure to STD    HPI Ashley Freeman is a 29 y.o. female currently [redacted] weeks pregnant comes to urgent care with some vaginal discharge in the setting of exposure to trichomonas.  Patient's partner was diagnosed with trichomonas recently.  She has been engaging in unprotected sexual intercourse with her partner.  She also complains about pain and swelling of the right labia majora.  Pregnancies are present to be unremarkable.  She follows up with the obstetrician regularly.  HPI  Past Medical History:  Diagnosis Date  . Abscess, gluteal, right 08/24/2013  . AKI (acute kidney injury) ([place]) 07/26/2014  . Anemia 02/19/2012  . Bartholin's gland abscess 09/19/2013  . BV (bacterial vaginosis) 11/24/2015  . Diabetes mellitus type I ([place]) 2001   Diagnosed at age 62 ; Type I  . Diarrhea 05/30/2016  . DKA (diabetic ketoacidoses) ([place]) 08/19/2013   Also in 2018  . Gonorrhea 08/2011   Treated in 09/2011  . History of trichomoniasis 05/31/2016  . Hyperlipidemia 03/28/2016  . Sepsis ([place]) 09/19/2013    Patient Active Problem List   Diagnosis Date Noted  . Systolic ejection murmur 37/01/6268  . Shortness of breath 08/03/2019  . Type 1 diabetes mellitus with hyperglycemia ([place]) 07/24/2019  . Chronic hypertension during pregnancy, antepartum 07/14/2019  . Diabetic retinopathy ([place]) 07/02/2019  . Proteinuria due to type 1 diabetes mellitus ([place]) 05/21/2019  . Proteinuria complicating pregnancy 48/54/6270  . Supervision of high risk pregnancy, antepartum 05/06/2019  . Pre-existing type 1 diabetes mellitus in pregnancy in third trimester 05/06/2019  . Diabetic neuropathy ([place]) 01/16/2016  . GBS (group b Streptococcus) UTI complicating pregnancy, first trimester 04/08/2014  . Diabetic ketoacidosis without coma associated with type 1  diabetes mellitus ([place]) 09/19/2013  . Diabetes mellitus type 1, uncontrolled, with complications ([place]) 35/00/9381    Past Surgical History:  Procedure Laterality Date  . INCISION AND DRAINAGE PERIRECTAL ABSCESS Right 08/18/2013   Procedure: IRRIGATION AND DEBRIDEMENT GLUTEAL ABSCESS;  Surgeon: Ralene Ok, MD;  Location: [place];  Service: General;  Laterality: Right;  . INCISION AND DRAINAGE PERIRECTAL ABSCESS Right 09/19/2013   Procedure: IRRIGATION AND DEBRIDEMENT RIGHT GLUTEAL AND LABIAL ABSCESSES;  Surgeon: Ralene Ok, MD;  Location: [facility];  Service: General;  Laterality: Right;  . INCISION AND DRAINAGE PERIRECTAL ABSCESS Right 09/24/2013   Procedure: IRRIGATION AND DEBRIDEMENT PERIRECTAL ABSCESS;  Surgeon: Gwenyth Ober, MD;  Location: [place];  Service: General;  Laterality: Right;    OB History    Gravida  2   Para  0   Term      Preterm      AB  1   Living  0     SAB  1   TAB      Ectopic      Multiple      Live Births               Home Medications    Prior to Admission medications   Medication Sig Start Date End Date Taking? Authorizing Provider  insulin aspart (NOVOLOG) 100 UNIT/ML injection Inject 30 Units into the skin as directed. Per pump settings 08/24/19  Yes Shamleffer, Melanie Crazier, MD  Insulin Disposable Pump (OMNIPOD DASH 5 PACK PODS) MISC 1 Package by Does not apply route as directed. 07/07/19  Yes Shamleffer,  Melanie Crazier, MD  pantoprazole (PROTONIX) 20 MG tablet Take 1 tablet (20 mg total) by mouth daily. Can increase to bid prn 07/16/19  Yes Pickens, Eduard Clos, MD  Prenatal Vit-Fe Fumarate-FA (PREPLUS) 27-1 MG TABS Take 1 tablet by mouth daily. 09/01/19  Yes [provider]  aspirin EC 81 MG tablet Take 1 tablet (81 mg total) by mouth daily. Take after 12 weeks for prevention of preeclampsia later in pregnancy 05/20/19   Anyanwu, Sallyanne Havers, MD  blood glucose meter kit and supplies KIT Dispense based on patient and insurance  preference. Use up to four times daily as directed. (FOR ICD-9 250.00, 250.01). 05/25/17   Isla Pence, MD  Blood Pressure Monitoring (BLOOD PRESSURE KIT) DEVI 1 Device by Does not apply route as needed. 05/06/19   Anyanwu, Sallyanne Havers, MD  Continuous Blood Gluc Receiver (DEXCOM G6 RECEIVER) DEVI 1 Device by Does not apply route as directed. 11/27/18   Shamleffer, Melanie Crazier, MD  Continuous Blood Gluc Sensor (DEXCOM G6 SENSOR) MISC USE AS DIRECTED. CHANGE SENSOR EVERY 10 DAYS 09/14/19   Shamleffer, Melanie Crazier, MD  Continuous Blood Gluc Transmit (DEXCOM G6 TRANSMITTER) MISC 1 Device by Does not apply route as directed. 11/27/18   Shamleffer, Melanie Crazier, MD  Ferrous Sulfate (IRON) 325 (65 Fe) MG TABS Take 1 tablet (325 mg total) by mouth every other day. 06/17/19   Fair, Marin Shutter, MD  fluticasone (FLONASE) 50 MCG/ACT nasal spray Place 1-2 sprays into both nostrils daily for 14 days. 07/23/19 08/06/19  Wieters, Hallie C, PA-C  Glucagon HCl 1 MG SOLR Inject 1 mg as directed as directed. 05/12/19   Shamleffer, Melanie Crazier, MD  INSULIN SYRINGE .5CC/29G (SAFETY INSULIN SYRINGES) 29G X 1/2" 0.5 ML MISC 1 Device by Does not apply route 3 (three) times daily. 07/03/19   Shamleffer, Melanie Crazier, MD  metroNIDAZOLE (FLAGYL) 500 MG tablet Take 1 tablet (500 mg total) by mouth 2 (two) times daily. 09/14/19   Leanne Sisler, Myrene Galas, MD    Family History Family History  Problem Relation Age of Onset  . Asthma Mother   . Carpal tunnel syndrome Mother   . Gout Father   . Diabetes Paternal Grandmother   . Anesthesia problems Neg Hx     Social History Social History   Tobacco Use  . Smoking status: Never Smoker  . Smokeless tobacco: Never Used  Substance Use Topics  . Alcohol use: Not Currently  . Drug use: No     Allergies   Penicillins, Benadryl [diphenhydramine], and Doxycycline   Review of Systems Review of Systems  Constitutional: Negative for activity change, fatigue and fever.  HENT:  Negative.   Respiratory: Negative.   Cardiovascular: Negative.   Gastrointestinal: Negative.   Genitourinary: Positive for pelvic pain and vaginal discharge. Negative for dysuria, genital sores, hematuria, vaginal bleeding and vaginal pain.  Musculoskeletal: Negative.      Physical Exam Triage Vital Signs ED Triage Vitals  Enc Vitals Group     BP 09/14/19 1634 (!) 151/87     Pulse Rate 09/14/19 1634 (!) 108     Resp 09/14/19 1634 20     Temp 09/14/19 1634 98.3 F (36.8 C)     Temp Source 09/14/19 1634 Oral     SpO2 09/14/19 1634 100 %     Weight --      Height --      Head Circumference --      Peak Flow --      Pain Score  09/14/19 1633 0     Pain Loc --      Pain Edu? --      Excl. in [place]? --    No data found.  Updated Vital Signs BP (!) 151/87 (BP Location: Right Arm)   Pulse (!) 108   Temp 98.3 F (36.8 C) (Oral)   Resp 20   LMP  (LMP Unknown)   SpO2 100%   Visual Acuity Right Eye Distance:   Left Eye Distance:   Bilateral Distance:    Right Eye Near:   Left Eye Near:    Bilateral Near:     Physical Exam Vitals and nursing note reviewed.  Constitutional:      Appearance: Normal appearance.  Cardiovascular:     Rate and Rhythm: Normal rate and regular rhythm.     Pulses: Normal pulses.     Heart sounds: Normal heart sounds.  Pulmonary:     Effort: Pulmonary effort is normal.     Breath sounds: Normal breath sounds.  Abdominal:     General: Bowel sounds are normal.     Palpations: Abdomen is soft.  Genitourinary:    General: Normal vulva.     Vagina: No vaginal discharge.  Musculoskeletal:        General: No swelling, tenderness, deformity or signs of injury. Normal range of motion.  Skin:    General: Skin is warm.     Capillary Refill: Capillary refill takes less than 2 seconds.     Coloration: Skin is not jaundiced or pale.  Neurological:     Mental Status: She is alert.      UC Treatments / Results  Labs (all labs ordered are listed,  but only abnormal results are displayed) Labs Reviewed  POCT URINALYSIS DIP (DEVICE) - Abnormal; Notable for the following components:      Result Value   Hgb urine dipstick MODERATE (*)    Protein, ur >=300 (*)    All other components within normal limits  CERVICOVAGINAL ANCILLARY ONLY    EKG   Radiology No results found.  Procedures Procedures (including critical care time)  Medications Ordered in UC Medications - No data to display  Initial Impression / Assessment and Plan / UC Course  I have reviewed the triage vital signs and the nursing notes.  Pertinent labs & imaging results that were available during my care of the patient were reviewed by me and considered in my medical decision making (see chart for details).    1.  Trichomonas exposure: Metronidazole 500 mg twice daily for 7 days Cervicovaginal swab for GC/chlamydia/trichomonas HIV/RPR Return precautions given Patient is advised to follow-up with OB/GYN   Final Clinical Impressions(s) / UC Diagnoses   Final diagnoses:  Trichomonas exposure   Discharge Instructions   None    ED Prescriptions    Medication Sig Dispense Auth. Provider   metroNIDAZOLE (FLAGYL) 500 MG tablet Take 1 tablet (500 mg total) by mouth 2 (two) times daily. 14 tablet Emmanuela Ghazi, Myrene Galas, MD     PDMP not reviewed this encounter.   Chase Picket, MD 09/14/19 [phone]

## 2019-09-14 NOTE — Telephone Encounter (Signed)
Patient called for the following:  Medication Refill Request  Did you call your pharmacy and request this refill first? Yes . If patient has not contacted pharmacy first, instruct them to do so for future refills.  . Remind them that contacting the pharmacy for their refill is the quickest method to get the refill.  . Refill policy also stated that it will take anywhere between 24-72 hours to receive the refill.    Name of medication?  Continuous Blood Gluc Sensor (DEXCOM G6 SENSOR) MISC  Is this a 90 day supply? Not sure  Name and location of pharmacy?   [place] ([place]), [place] - [address] Phone:  [phone]  Fax:  [phone]      . Is the request for diabetes test strips? No . If yes, what brand? N/A

## 2019-09-14 NOTE — ED Triage Notes (Signed)
Pt states her partner informed her that he was positive for trichamonas. Pt denies vaginal discharge or abdom pain, but states right side of her vagina/labia is swollen. Denies fever, n/v/d.

## 2019-09-15 LAB — CERVICOVAGINAL ANCILLARY ONLY
Chlamydia: NEGATIVE
Comment: NEGATIVE
Comment: NEGATIVE
Comment: NORMAL
Neisseria Gonorrhea: NEGATIVE
Trichomonas: NEGATIVE

## 2019-09-22 ENCOUNTER — Telehealth: Payer: Self-pay | Admitting: Internal Medicine

## 2019-09-22 DIAGNOSIS — O24019 Pre-existing diabetes mellitus, type 1, in pregnancy, unspecified trimester: Secondary | ICD-10-CM

## 2019-09-22 NOTE — Telephone Encounter (Signed)
Patient called stating her blood sugar is high and requests Dr / nurse to call to advise her what to do. [phone]

## 2019-09-22 NOTE — Telephone Encounter (Signed)
2nd attempt

## 2019-09-22 NOTE — Telephone Encounter (Signed)
Attempted to call pt back phone went to vm on 1st ring and vm has not been set up

## 2019-09-24 ENCOUNTER — Ambulatory Visit (INDEPENDENT_AMBULATORY_CARE_PROVIDER_SITE_OTHER): Payer: BLUE CROSS/BLUE SHIELD | Admitting: Obstetrics and Gynecology

## 2019-09-24 ENCOUNTER — Other Ambulatory Visit: Payer: Self-pay

## 2019-09-24 VITALS — BP 144/88 | HR 111 | Wt 170.6 lb

## 2019-09-24 DIAGNOSIS — R600 Localized edema: Secondary | ICD-10-CM

## 2019-09-24 DIAGNOSIS — Z3A30 30 weeks gestation of pregnancy: Secondary | ICD-10-CM

## 2019-09-24 DIAGNOSIS — O10919 Unspecified pre-existing hypertension complicating pregnancy, unspecified trimester: Secondary | ICD-10-CM

## 2019-09-24 DIAGNOSIS — E109 Type 1 diabetes mellitus without complications: Secondary | ICD-10-CM

## 2019-09-24 DIAGNOSIS — O2341 Unspecified infection of urinary tract in pregnancy, first trimester: Secondary | ICD-10-CM

## 2019-09-24 DIAGNOSIS — B951 Streptococcus, group B, as the cause of diseases classified elsewhere: Secondary | ICD-10-CM

## 2019-09-24 DIAGNOSIS — O0993 Supervision of high risk pregnancy, unspecified, third trimester: Secondary | ICD-10-CM

## 2019-09-24 DIAGNOSIS — O24013 Pre-existing diabetes mellitus, type 1, in pregnancy, third trimester: Secondary | ICD-10-CM

## 2019-09-24 DIAGNOSIS — Z794 Long term (current) use of insulin: Secondary | ICD-10-CM

## 2019-09-24 DIAGNOSIS — O10013 Pre-existing essential hypertension complicating pregnancy, third trimester: Secondary | ICD-10-CM

## 2019-09-24 DIAGNOSIS — O0990 Supervision of high risk pregnancy, unspecified, unspecified trimester: Secondary | ICD-10-CM

## 2019-09-24 NOTE — Telephone Encounter (Signed)
Pt called back had her link her Dexcom and upload and printed report and placed on your desk.

## 2019-09-24 NOTE — Progress Notes (Signed)
  Prenatal Visit Note Date: 09/24/2019 Clinic: [facility]  Subjective:  Ashley Freeman is a 29 y.o. G2P0010 at [redacted]w[redacted]d being seen today for ongoing prenatal care.  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  Patient reports b/l leg edema.   Contractions: Not present.  .  Movement: Present. Denies leaking of fluid.   The following portions of the patient's history were reviewed and updated as appropriate: allergies, current medications, past family history, past medical history, past social history, past surgical history and problem list. Problem list updated.  Objective:   Vitals:   09/24/19 1544  BP: (!) 144/88  Pulse: (!) 111  Weight: 170 lb 9.6 oz (77.4 kg)    Fetal Status: Fetal Heart Rate (bpm): 154   Movement: Present     General:  Alert, oriented and cooperative. Patient is in no acute distress.  Skin: Skin is warm and dry. No rash noted.   Cardiovascular: Normal heart rate noted  Respiratory: Normal respiratory effort, no problems with respiration noted  Abdomen: Soft, gravid, appropriate for gestational age. Pain/Pressure: Absent     Pelvic:  Cervical exam deferred        Extremities: Normal range of motion.  1 to 2+ b/l LE edema   Mental Status: Normal mood and affect. Normal behavior. Normal judgment and thought content.   Urinalysis:      Assessment and Plan:  Pregnancy: G2P0010 at [redacted]w[redacted]d  1. Supervision of high risk pregnancy, antepartum Routine care - Ambulatory referral to Occupational Therapy  2. Pre-existing type 1 diabetes mellitus in pregnancy in third trimester -Followed by [facility]. Pt with insulin pump with continuous CBGs. She states am fasting are getting down to 120s and 2h post prandials around 140s. Endocrine titrating because it can get close to the 200s overnight.  -5/7 growth normal at 57%, AC 82%, AFI 21.6 -to start weekly testing at 32wks -follow up with mfm but likely will recommend delivery at 37wks  3. Chronic hypertension during pregnancy, antepartum Doing well on no meds  4. GBS (group b Streptococcus) UTI complicating pregnancy, first trimester Neg toc  5. Bilateral leg edema Will refer to OT. Pt had pre-existing proteinuria before pregnancy.  - Ambulatory referral to Occupational Therapy   Preterm labor symptoms and general obstetric precautions including but not limited to vaginal bleeding, contractions, leaking of fluid and fetal movement were reviewed in detail with the patient. Please refer to After Visit Summary for other counseling recommendations.  Return in about 2 weeks (around 10/08/2019) for high risk, in person.   Aletha Halim, MD

## 2019-09-24 NOTE — Telephone Encounter (Signed)
Will you please reach out to pt?

## 2019-09-27 ENCOUNTER — Other Ambulatory Visit: Payer: Self-pay

## 2019-09-27 ENCOUNTER — Inpatient Hospital Stay (HOSPITAL_COMMUNITY): Payer: BLUE CROSS/BLUE SHIELD

## 2019-09-27 ENCOUNTER — Encounter (HOSPITAL_COMMUNITY): Payer: Self-pay | Admitting: Obstetrics and Gynecology

## 2019-09-27 ENCOUNTER — Inpatient Hospital Stay (HOSPITAL_COMMUNITY)
Admission: AD | Admit: 2019-09-27 | Discharge: 2019-10-08 | DRG: 786 | Disposition: A | Discharge status: Home / Self Care | Payer: BLUE CROSS/BLUE SHIELD | Attending: Obstetrics and Gynecology | Admitting: Obstetrics and Gynecology

## 2019-09-27 DIAGNOSIS — Z3A31 31 weeks gestation of pregnancy: Secondary | ICD-10-CM

## 2019-09-27 DIAGNOSIS — R600 Localized edema: Secondary | ICD-10-CM | POA: Insufficient documentation

## 2019-09-27 DIAGNOSIS — O24813 Other pre-existing diabetes mellitus in pregnancy, third trimester: Secondary | ICD-10-CM | POA: Diagnosis not present

## 2019-09-27 DIAGNOSIS — O114 Pre-existing hypertension with pre-eclampsia, complicating childbirth: Secondary | ICD-10-CM | POA: Diagnosis present

## 2019-09-27 DIAGNOSIS — O2402 Pre-existing diabetes mellitus, type 1, in childbirth: Secondary | ICD-10-CM | POA: Diagnosis present

## 2019-09-27 DIAGNOSIS — E109 Type 1 diabetes mellitus without complications: Secondary | ICD-10-CM

## 2019-09-27 DIAGNOSIS — E669 Obesity, unspecified: Secondary | ICD-10-CM | POA: Diagnosis present

## 2019-09-27 DIAGNOSIS — O1424 HELLP syndrome, complicating childbirth: Secondary | ICD-10-CM | POA: Diagnosis not present

## 2019-09-27 DIAGNOSIS — O24013 Pre-existing diabetes mellitus, type 1, in pregnancy, third trimester: Secondary | ICD-10-CM | POA: Diagnosis not present

## 2019-09-27 DIAGNOSIS — Z880 Allergy status to penicillin: Secondary | ICD-10-CM

## 2019-09-27 DIAGNOSIS — N189 Chronic kidney disease, unspecified: Secondary | ICD-10-CM | POA: Diagnosis present

## 2019-09-27 DIAGNOSIS — E1022 Type 1 diabetes mellitus with diabetic chronic kidney disease: Secondary | ICD-10-CM | POA: Diagnosis present

## 2019-09-27 DIAGNOSIS — N762 Acute vulvitis: Secondary | ICD-10-CM | POA: Diagnosis not present

## 2019-09-27 DIAGNOSIS — Z9641 Presence of insulin pump (external) (internal): Secondary | ICD-10-CM | POA: Diagnosis present

## 2019-09-27 DIAGNOSIS — O2341 Unspecified infection of urinary tract in pregnancy, first trimester: Secondary | ICD-10-CM | POA: Diagnosis present

## 2019-09-27 DIAGNOSIS — O99214 Obesity complicating childbirth: Secondary | ICD-10-CM | POA: Diagnosis present

## 2019-09-27 DIAGNOSIS — O2693 Pregnancy related conditions, unspecified, third trimester: Secondary | ICD-10-CM | POA: Diagnosis not present

## 2019-09-27 DIAGNOSIS — R7401 Elevation of levels of liver transaminase levels: Secondary | ICD-10-CM | POA: Diagnosis not present

## 2019-09-27 DIAGNOSIS — O99284 Endocrine, nutritional and metabolic diseases complicating childbirth: Secondary | ICD-10-CM | POA: Diagnosis present

## 2019-09-27 DIAGNOSIS — O1413 Severe pre-eclampsia, third trimester: Secondary | ICD-10-CM | POA: Diagnosis not present

## 2019-09-27 DIAGNOSIS — R809 Proteinuria, unspecified: Secondary | ICD-10-CM | POA: Diagnosis present

## 2019-09-27 DIAGNOSIS — E10319 Type 1 diabetes mellitus with unspecified diabetic retinopathy without macular edema: Secondary | ICD-10-CM | POA: Diagnosis present

## 2019-09-27 DIAGNOSIS — Z3A32 32 weeks gestation of pregnancy: Secondary | ICD-10-CM | POA: Diagnosis not present

## 2019-09-27 DIAGNOSIS — E1040 Type 1 diabetes mellitus with diabetic neuropathy, unspecified: Secondary | ICD-10-CM | POA: Diagnosis present

## 2019-09-27 DIAGNOSIS — O24419 Gestational diabetes mellitus in pregnancy, unspecified control: Secondary | ICD-10-CM

## 2019-09-27 DIAGNOSIS — O9081 Anemia of the puerperium: Secondary | ICD-10-CM | POA: Diagnosis not present

## 2019-09-27 DIAGNOSIS — D649 Anemia, unspecified: Secondary | ICD-10-CM | POA: Diagnosis not present

## 2019-09-27 DIAGNOSIS — D62 Acute posthemorrhagic anemia: Secondary | ICD-10-CM | POA: Diagnosis not present

## 2019-09-27 DIAGNOSIS — O1002 Pre-existing essential hypertension complicating childbirth: Secondary | ICD-10-CM | POA: Diagnosis present

## 2019-09-27 DIAGNOSIS — O24313 Unspecified pre-existing diabetes mellitus in pregnancy, third trimester: Secondary | ICD-10-CM | POA: Diagnosis not present

## 2019-09-27 DIAGNOSIS — Z794 Long term (current) use of insulin: Secondary | ICD-10-CM | POA: Diagnosis not present

## 2019-09-27 DIAGNOSIS — E1065 Type 1 diabetes mellitus with hyperglycemia: Secondary | ICD-10-CM | POA: Diagnosis present

## 2019-09-27 DIAGNOSIS — Z8759 Personal history of other complications of pregnancy, childbirth and the puerperium: Secondary | ICD-10-CM | POA: Diagnosis present

## 2019-09-27 DIAGNOSIS — Z20822 Contact with and (suspected) exposure to covid-19: Secondary | ICD-10-CM | POA: Diagnosis present

## 2019-09-27 DIAGNOSIS — R7989 Other specified abnormal findings of blood chemistry: Secondary | ICD-10-CM | POA: Diagnosis not present

## 2019-09-27 DIAGNOSIS — O10919 Unspecified pre-existing hypertension complicating pregnancy, unspecified trimester: Secondary | ICD-10-CM | POA: Diagnosis present

## 2019-09-27 DIAGNOSIS — O1210 Gestational proteinuria, unspecified trimester: Secondary | ICD-10-CM | POA: Diagnosis present

## 2019-09-27 DIAGNOSIS — O99019 Anemia complicating pregnancy, unspecified trimester: Secondary | ICD-10-CM | POA: Diagnosis present

## 2019-09-27 DIAGNOSIS — O1213 Gestational proteinuria, third trimester: Secondary | ICD-10-CM | POA: Diagnosis not present

## 2019-09-27 DIAGNOSIS — O99013 Anemia complicating pregnancy, third trimester: Secondary | ICD-10-CM | POA: Diagnosis not present

## 2019-09-27 DIAGNOSIS — O23593 Infection of other part of genital tract in pregnancy, third trimester: Secondary | ICD-10-CM | POA: Diagnosis not present

## 2019-09-27 DIAGNOSIS — I129 Hypertensive chronic kidney disease with stage 1 through stage 4 chronic kidney disease, or unspecified chronic kidney disease: Secondary | ICD-10-CM | POA: Diagnosis present

## 2019-09-27 DIAGNOSIS — O10013 Pre-existing essential hypertension complicating pregnancy, third trimester: Secondary | ICD-10-CM | POA: Diagnosis not present

## 2019-09-27 DIAGNOSIS — B951 Streptococcus, group B, as the cause of diseases classified elsewhere: Secondary | ICD-10-CM | POA: Diagnosis present

## 2019-09-27 DIAGNOSIS — O99891 Other specified diseases and conditions complicating pregnancy: Secondary | ICD-10-CM | POA: Diagnosis not present

## 2019-09-27 DIAGNOSIS — E1069 Type 1 diabetes mellitus with other specified complication: Secondary | ICD-10-CM | POA: Diagnosis not present

## 2019-09-27 DIAGNOSIS — O1414 Severe pre-eclampsia complicating childbirth: Secondary | ICD-10-CM | POA: Diagnosis not present

## 2019-09-27 DIAGNOSIS — E1029 Type 1 diabetes mellitus with other diabetic kidney complication: Secondary | ICD-10-CM | POA: Diagnosis present

## 2019-09-27 LAB — CBC
HCT: 29.6 % — ABNORMAL LOW (ref 36.0–46.0)
Hemoglobin: 9.6 g/dL — ABNORMAL LOW (ref 12.0–15.0)
MCH: 28.6 pg (ref 26.0–34.0)
MCHC: 32.4 g/dL (ref 30.0–36.0)
MCV: 88.1 fL (ref 80.0–100.0)
Platelets: 331 10*3/uL (ref 150–400)
RBC: 3.36 MIL/uL — ABNORMAL LOW (ref 3.87–5.11)
RDW: 13.8 % (ref 11.5–15.5)
WBC: 12.6 10*3/uL — ABNORMAL HIGH (ref 4.0–10.5)
nRBC: 0 % (ref 0.0–0.2)

## 2019-09-27 LAB — COMPREHENSIVE METABOLIC PANEL
ALT: 41 U/L (ref 0–44)
AST: 69 U/L — ABNORMAL HIGH (ref 15–41)
Albumin: 2 g/dL — ABNORMAL LOW (ref 3.5–5.0)
Alkaline Phosphatase: 80 U/L (ref 38–126)
Anion gap: 11 (ref 5–15)
BUN: 16 mg/dL (ref 6–20)
CO2: 20 mmol/L — ABNORMAL LOW (ref 22–32)
Calcium: 9 mg/dL (ref 8.9–10.3)
Chloride: 106 mmol/L (ref 98–111)
Creatinine, Ser: 1.14 mg/dL — ABNORMAL HIGH (ref 0.44–1.00)
GFR calc Af Amer: >60 mL/min (ref >60)
GFR calc non Af Amer: >60 mL/min (ref >60)
Glucose, Bld: 138 mg/dL — ABNORMAL HIGH (ref 70–99)
Potassium: 4.2 mmol/L (ref 3.5–5.1)
Sodium: 137 mmol/L (ref 135–145)
Total Bilirubin: 0.9 mg/dL (ref 0.3–1.2)
Total Protein: 6.2 g/dL — ABNORMAL LOW (ref 6.5–8.1)

## 2019-09-27 LAB — HEMOGLOBIN A1C
Hgb A1c MFr Bld: 6.8 % — ABNORMAL HIGH (ref 4.8–5.6)
Mean Plasma Glucose: 148.46 mg/dL

## 2019-09-27 LAB — SARS CORONAVIRUS 2 BY RT PCR (HOSPITAL ORDER, PERFORMED IN ~~LOC~~ HOSPITAL LAB): SARS Coronavirus 2: NEGATIVE

## 2019-09-27 LAB — BASIC METABOLIC PANEL
Anion gap: 9 (ref 5–15)
BUN: 18 mg/dL (ref 6–20)
CO2: 16 mmol/L — ABNORMAL LOW (ref 22–32)
Calcium: 8 mg/dL — ABNORMAL LOW (ref 8.9–10.3)
Chloride: 112 mmol/L — ABNORMAL HIGH (ref 98–111)
Creatinine, Ser: 1.09 mg/dL — ABNORMAL HIGH (ref 0.44–1.00)
GFR calc Af Amer: >60 mL/min (ref >60)
GFR calc non Af Amer: >60 mL/min (ref >60)
Glucose, Bld: 136 mg/dL — ABNORMAL HIGH (ref 70–99)
Potassium: 4.1 mmol/L (ref 3.5–5.1)
Sodium: 137 mmol/L (ref 135–145)

## 2019-09-27 LAB — TYPE AND SCREEN
ABO/RH(D): A POS
Antibody Screen: NEGATIVE

## 2019-09-27 MED ORDER — PRENATAL MULTIVITAMIN CH
1.0000 | ORAL_TABLET | Freq: Every day | ORAL | Status: DC
  Administered 2019-09-27 – 2019-10-04 (×8): 1 via ORAL
  Filled 2019-09-27 (×8): qty 1

## 2019-09-27 MED ORDER — VANCOMYCIN HCL IN DEXTROSE 1-5 GM/200ML-% IV SOLN
1000.0000 mg | Freq: Two times a day (BID) | INTRAVENOUS | Status: DC
  Administered 2019-09-27 – 2019-09-28 (×4): 1000 mg via INTRAVENOUS
  Filled 2019-09-27 (×6): qty 200

## 2019-09-27 MED ORDER — ACETAMINOPHEN 325 MG PO TABS
650.0000 mg | ORAL_TABLET | ORAL | Status: DC | PRN
  Administered 2019-09-27 – 2019-09-28 (×4): 650 mg via ORAL
  Filled 2019-09-27 (×4): qty 2

## 2019-09-27 MED ORDER — SODIUM CHLORIDE 0.9 % IV SOLN: INTRAVENOUS | Status: DC

## 2019-09-27 MED ORDER — BENZOCAINE-MENTHOL 20-0.5 % EX AERO
1.0000 "application " | INHALATION_SPRAY | Freq: Four times a day (QID) | CUTANEOUS | Status: DC | PRN
  Administered 2019-09-29: 1 via TOPICAL
  Filled 2019-09-27 (×2): qty 56

## 2019-09-27 MED ORDER — OXYCODONE HCL 5 MG PO TABS
5.0000 mg | ORAL_TABLET | Freq: Four times a day (QID) | ORAL | Status: DC | PRN
  Administered 2019-09-27 – 2019-09-29 (×4): 5 mg via ORAL
  Filled 2019-09-27 (×5): qty 1

## 2019-09-27 MED ORDER — CALCIUM CARBONATE ANTACID 500 MG PO CHEW
2.0000 | CHEWABLE_TABLET | ORAL | Status: DC | PRN
  Administered 2019-10-02 (×2): 400 mg via ORAL
  Filled 2019-09-27 (×2): qty 2

## 2019-09-27 MED ORDER — DOCUSATE SODIUM 100 MG PO CAPS
100.0000 mg | ORAL_CAPSULE | Freq: Every day | ORAL | Status: DC
  Administered 2019-09-27 – 2019-10-04 (×7): 100 mg via ORAL
  Filled 2019-09-27 (×8): qty 1

## 2019-09-27 MED ORDER — ZOLPIDEM TARTRATE 5 MG PO TABS
5.0000 mg | ORAL_TABLET | Freq: Every evening | ORAL | Status: DC | PRN
  Administered 2019-10-02 – 2019-10-03 (×2): 5 mg via ORAL
  Filled 2019-09-27 (×2): qty 1

## 2019-09-27 MED ORDER — ASPIRIN EC 81 MG PO TBEC
81.0000 mg | DELAYED_RELEASE_TABLET | Freq: Every day | ORAL | Status: DC
  Administered 2019-09-27 – 2019-10-04 (×8): 81 mg via ORAL
  Filled 2019-09-27 (×9): qty 1

## 2019-09-27 MED ORDER — PANTOPRAZOLE SODIUM 20 MG PO TBEC
20.0000 mg | DELAYED_RELEASE_TABLET | Freq: Every day | ORAL | Status: DC
  Administered 2019-09-27 – 2019-10-02 (×7): 20 mg via ORAL
  Filled 2019-09-27 (×7): qty 1

## 2019-09-27 MED ORDER — METRONIDAZOLE IN NACL 5-0.79 MG/ML-% IV SOLN
500.0000 mg | Freq: Three times a day (TID) | INTRAVENOUS | Status: DC
  Administered 2019-09-27 – 2019-09-29 (×6): 500 mg via INTRAVENOUS
  Filled 2019-09-27 (×8): qty 100

## 2019-09-27 MED ORDER — SODIUM CHLORIDE 0.9 % IV SOLN: INTRAVENOUS | Status: AC

## 2019-09-27 MED ORDER — INSULIN ASPART 100 UNIT/ML ~~LOC~~ SOLN
30.0000 [IU] | SUBCUTANEOUS | Status: DC
  Administered 2019-09-27: 1.15 [IU] via SUBCUTANEOUS
  Administered 2019-09-27: 2.35 [IU] via SUBCUTANEOUS
  Administered 2019-09-28: 0.75 [IU] via SUBCUTANEOUS
  Administered 2019-09-29: 30 [IU] via SUBCUTANEOUS

## 2019-09-27 NOTE — MAU Note (Signed)
Pt reports vaginal swelling and pain x3 days. Feels like there was a bump earlier but used a warm compress which helped some, but then it got bigger. Took some 2 ES Tylenol this evening, but did not help. She denies itching or discharge. Denies vaginal bleeding. She denies contractions. Reports good fetal movement.

## 2019-09-27 NOTE — Progress Notes (Signed)
FACULTY PRACTICE ANTEPARTUM(COMPREHENSIVE) NOTE HD -0  Ashley Freeman is a 29 y.o. G2P0010 at [redacted]w[redacted]d type 1 DM on insulin pump and continuous glucose monitor who is admitted for IV antibiotics for vulvar cellulitis.   Fetal presentation is unsure. Length of Stay:  0  Days  Subjective: Pt unchanged from 3 am admit. Resting at present Patient reports the fetal movement as active. Patient reports uterine contraction  activity as none. Patient reports  vaginal bleeding as none. Patient describes fluid per vagina as None.  Vitals:  Blood pressure (!) 141/89, pulse (!) 110, temperature 98.2 F (36.8 C), temperature source Oral, resp. rate 18, height 5\' 3"  (1.6 m), weight 75.3 kg, SpO2 100 %. Physical Examination:  General appearance - alert, well appearing, and in no distress, oriented to person, place, and time and normal appearing weight Heart - normal rate and regular rhythm Abdomen - soft, nontender, nondistended Fundal Height:  size equals dates Cervical Exam: Not evaluated. and found to be  Extremities: extremities normal, atraumatic, no cyanosis or edema and Homans sign is negative, no sign of DVT with DTRs 2+ bilaterally Membranes:intact  Fetal Monitoring:  Baseline: 140 bpm, Variability: Good {> 6 bpm), Accelerations: Reactive and Decelerations: Absent  Labs:  Results for orders placed or performed during the hospital encounter of 09/27/19 (from the past 24 hour(s))  Comprehensive metabolic panel   Collection Time: 09/27/19  3:14 AM  Result Value Ref Range   Sodium 137 135 - 145 mmol/L   Potassium 4.2 3.5 - 5.1 mmol/L   Chloride 106 98 - 111 mmol/L   CO2 20 (L) 22 - 32 mmol/L   Glucose, Bld 138 (H) 70 - 99 mg/dL   BUN 16 6 - 20 mg/dL   Creatinine, Ser 1.14 (H) 0.44 - 1.00 mg/dL   Calcium 9.0 8.9 - 10.3 mg/dL   Total Protein 6.2 (L) 6.5 - 8.1 g/dL   Albumin 2.0 (L) 3.5 - 5.0 g/dL   AST 69 (H) 15 - 41 U/L   ALT 41 0 - 44 U/L   Alkaline Phosphatase 80 38 - 126  U/L   Total Bilirubin 0.9 0.3 - 1.2 mg/dL   GFR calc non Af Amer >60 >60 mL/min   GFR calc Af Amer >60 >60 mL/min   Anion gap 11 5 - 15  Hemoglobin A1c   Collection Time: 09/27/19  3:14 AM  Result Value Ref Range   Hgb A1c MFr Bld 6.8 (H) 4.8 - 5.6 %   Mean Plasma Glucose 148.46 mg/dL  CBC on admission   Collection Time: 09/27/19  3:14 AM  Result Value Ref Range   WBC 12.6 (H) 4.0 - 10.5 K/uL   RBC 3.36 (L) 3.87 - 5.11 MIL/uL   Hemoglobin 9.6 (L) 12.0 - 15.0 g/dL   HCT 29.6 (L) 36.0 - 46.0 %   MCV 88.1 80.0 - 100.0 fL   MCH 28.6 26.0 - 34.0 pg   MCHC 32.4 30.0 - 36.0 g/dL   RDW 13.8 11.5 - 15.5 %   Platelets 331 150 - 400 K/uL   nRBC 0.0 0.0 - 0.2 %  Type and screen [place]   Collection Time: 09/27/19  3:14 AM  Result Value Ref Range   ABO/RH(D) A POS    Antibody Screen NEG    Sample Expiration      09/30/2019,2359 Performed at [facility], [address] [place], [place] [address]   SARS Coronavirus 2 by RT PCR (hospital order, performed  in [place] lab) Nasopharyngeal Nasopharyngeal Swab   Collection Time: 09/27/19  3:35 AM   Specimen: Nasopharyngeal Swab  Result Value Ref Range   SARS Coronavirus 2 NEGATIVE NEGATIVE    Imaging Studies:    ----------------------------------------------------------------------  OBSTETRICS REPORT                       (Signed Final 09/04/2019 10:24 am) ---------------------------------------------------------------------- Patient Info  ID #:       [id number]                          D.O.B.:  09/30/1990 (29 yrs)  Name:       Ashley MAHADEO [facility]-           Visit Date: 09/04/2019 09:46 am              Freeman ---------------------------------------------------------------------- Performed By  Attending:        Johnell Comings MD         Ref. Address:     [address]                                                             [place], [place]  Performed By:     Jeanene Erb BS,      [facility]  Referred By:      [facility] ---------------------------------------------------------------------- Orders  #  Description                           Code        Ordered By  1  [place] MFM OB FOLLOW UP                   [phone]    [place] ----------------------------------------------------------------------  #  Order #                     Accession #                Episode #  1  [id number]                   [id number]                 [id number] ---------------------------------------------------------------------- Indications  Diabetes - Pregestational, 2nd trimester  O24.312  (brittle)  [redacted] weeks gestation of pregnancy                Z3A.27  Low risk NIPS (Negative AFP)(ECHO  WNL)(Negative [place])  Medical complication of pregnancy (diabetic    O26.90  retinopathy & neuropathy, chronic proteinuria  Hypertension - Chronic/Pre-existing            O10.019 ---------------------------------------------------------------------- Fetal Evaluation  Num Of Fetuses:         1  Fetal Heart Rate(bpm):  153  Cardiac Activity:       Observed  Presentation:           Breech  Placenta:               Posterior  P. Cord Insertion:      Previously Visualized  Amniotic Fluid  AFI FV:      Within normal limits  AFI Sum(cm)     %Tile       Largest Pocket(cm)  21.6            89          5.8  RUQ(cm)       RLQ(cm)       LUQ(cm)        LLQ(cm)  5.8           5.1           5.1            5.6 ---------------------------------------------------------------------- Biometry  BPD:      68.7  mm     G. Age:  27w 4d         31  %    CI:        75.99   %    70 - 86                                                          FL/HC:      20.6   %    18.8 - 20.6  HC:      249.8  mm     G. Age:  27w 1d          7  %     HC/AC:      1.00        1.05 - 1.21  AC:      250.5  mm     G. Age:  29w 2d         82  %    FL/BPD:     74.8   %    71 - 87  FL:       51.4  mm     G. Age:  27w 3d         25  %    FL/AC:      20.5   %    20 - 24  Est. FW:    1213  gm    2 lb 11 oz      57  % ---------------------------------------------------------------------- OB History  Gravidity:    2         Term:   0        Prem:   0        SAB:   1  TOP:  0       Ectopic:  0        Living: 0 ---------------------------------------------------------------------- Gestational Age  U/S Today:     27w 6d                                        EDD:   11/28/19  Best:          27w 6d     Det. ByLoman Chroman         EDD:   11/28/19                                      (04/21/19) ---------------------------------------------------------------------- Anatomy  Cranium:               Appears normal         Aortic Arch:            Appears normal  Cavum:                 Appears normal         Ductal Arch:            Appears normal  Ventricles:            Appears normal         Diaphragm:              Previously seen  Choroid Plexus:        Previously seen        Stomach:                Appears normal, left                                                                        sided  Cerebellum:            Appears normal         Abdomen:                Appears normal  Posterior Fossa:       Appears normal         Abdominal Wall:         Previously seen  Nuchal Fold:           Not applicable (>27    Cord Vessels:           Previously seen                         wks GA)  Face:                  Orbits and profile     Kidneys:                Appear normal                         previously seen  Lips:  Appears normal         Bladder:                Appears normal  Thoracic:              Appears normal         Spine:                  Previously seen  Heart:                 Appears normal         Upper Extremities:       Previously seen                         (4CH, axis, and                         situs)  RVOT:                  Previously seen        Lower Extremities:      Previously seen  LVOT:                  Previously seen  Other:  Heels and 5th digit visualized previously. Technically difficult due to          fetal position. ---------------------------------------------------------------------- Cervix Uterus Adnexa  Cervix  Not visualized (advanced GA >24wks) ---------------------------------------------------------------------- Comments  This patient was seen for a follow up growth scan due to  pregestational diabetes and history of chronic hypertension.  She denies any problems since her last exam and reports  that she had a normal fetal echocardiogram performed by  pediatric cardiology.  She was informed that the fetal growth and amniotic fluid  level appears appropriate for her gestational age.  A follow up exam was scheduled in 4 weeks.  Due to pregestational diabetes and chronic hypertension, we  will start weekly fetal testing at 32 weeks. ----------------------------------------------------------------------                   Johnell Comings, MD Electronically Signed Final Report   09/04/2019 10:24 am ----------------------------------------------------------------------   Medications:  Scheduled . aspirin EC  81 mg Oral Daily  . docusate sodium  100 mg Oral Daily  . insulin aspart  30 Units Subcutaneous UD  . pantoprazole  20 mg Oral Daily  . prenatal multivitamin  1 tablet Oral Q1200   I have reviewed the patient's current medications.  ASSESSMENT: Patient Active Problem List   Diagnosis Date Noted  . Vulvar cellulitis 09/27/2019  . Systolic ejection murmur 37/01/6268  . Shortness of breath 08/03/2019  . Type 1 diabetes mellitus with hyperglycemia ([place]) 07/24/2019  . Chronic hypertension during pregnancy, antepartum 07/14/2019  . Diabetic retinopathy ([place]) 07/02/2019  .  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 Proteinuria complicating pregnancy 48/54/6270  . Supervision of high risk pregnancy, antepartum 05/06/2019  . Pre-existing type 1 diabetes mellitus in pregnancy in third trimester 05/06/2019  . Diabetic neuropathy ([place]) 01/16/2016  . GBS (group b Streptococcus) UTI complicating pregnancy, first trimester 04/08/2014  . Diabetic ketoacidosis without coma associated with type 1 diabetes mellitus ([place]) 09/19/2013  . Diabetes mellitus type 1, uncontrolled, with complications ([place]) 35/00/9381    PLAN: IV antibiotics today Perineal u/s today, if loculation noted, would perform I & D likely tomorrow  [name] 09/27/2019,6:43  AM    Patient ID: Nikeshia Keetch, female   DOB: [birth year], 29 y.o.   MRN: [id number]

## 2019-09-27 NOTE — H&P (Signed)
FACULTY PRACTICE ANTEPARTUM ADMISSION HISTORY AND PHYSICAL NOTE   History of Present Illness: Ashley Freeman is a 29 y.o. G2P0010 at 37w1dadmitted for vulvar cellulitis. Presented to [facility] this evening reporting vaginal swelling & pain. Symptoms started 3 days ago. Has treated with warm compresses without relief. History of bartholin's gland abscess & gluteal abscesses. Denies any other symptoms.  Was treated for trichomonas exposure on 5/17. Her swabs were negative for trich, gonorrhea, & chlamydia. Has not had intercourse since then. She is an insulin dependent type 1 diabetic with a pump & has chronic hypertension that is currently not medicated.    Patient Active Problem List   Diagnosis Date Noted  . Systolic ejection murmur 083/38/2505 . Shortness of breath 08/03/2019  . Type 1 diabetes mellitus with hyperglycemia ([facility] 07/24/2019  . Chronic hypertension during pregnancy, antepartum 07/14/2019  . Diabetic retinopathy ([facility] 07/02/2019  . 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 Proteinuria complicating pregnancy 039/76/7341 . Supervision of high risk pregnancy, antepartum 05/06/2019  . Pre-existing type 1 diabetes mellitus in pregnancy in third trimester 05/06/2019  . Diabetic neuropathy ([facility] 01/16/2016  . GBS (group b Streptococcus) UTI complicating pregnancy, first trimester 04/08/2014  . Diabetic ketoacidosis without coma associated with type 1 diabetes mellitus ([facility] 09/19/2013  . Diabetes mellitus type 1, uncontrolled, with complications ([facility] 093/79/0240   Past Medical History:  Diagnosis Date  . Abscess, gluteal, right 08/24/2013  . AKI (acute kidney injury) ([facility] 07/26/2014  . Anemia 02/19/2012  . Bartholin's gland abscess 09/19/2013  . BV (bacterial vaginosis) 11/24/2015  . Diabetes mellitus type I ([facility] 2001   Diagnosed at age 89; Type I  . Diarrhea 05/30/2016  . DKA (diabetic ketoacidoses) ([facility] 08/19/2013   Also in 2018  .  Gonorrhea 08/2011   Treated in 09/2011  . History of trichomoniasis 05/31/2016  . Hyperlipidemia 03/28/2016  . Sepsis ([facility] 09/19/2013    Past Surgical History:  Procedure Laterality Date  . INCISION AND DRAINAGE PERIRECTAL ABSCESS Right 08/18/2013   Procedure: IRRIGATION AND DEBRIDEMENT GLUTEAL ABSCESS;  Surgeon: ARalene Ok MD;  [facility]  Service: General;  Laterality: Right;  . INCISION AND DRAINAGE PERIRECTAL ABSCESS Right 09/19/2013   Procedure: IRRIGATION AND DEBRIDEMENT RIGHT GLUTEAL AND LABIAL ABSCESSES;  Surgeon: ARalene Ok MD;  [facility]  Service: General;  Laterality: Right;  . INCISION AND DRAINAGE PERIRECTAL ABSCESS Right 09/24/2013   Procedure: IRRIGATION AND DEBRIDEMENT PERIRECTAL ABSCESS;  Surgeon: JGwenyth Ober MD;  [facility]  Service: General;  Laterality: Right;    OB History  Gravida Para Term Preterm AB Living  2 0     1 0  SAB TAB Ectopic Multiple Live Births  1            # Outcome Date GA Lbr Len/2nd Weight Sex Delivery Anes PTL Lv  2 Current           1 SAB 08/23/16 630w0d          Birth Comments: 1st trimester    Social History   Socioeconomic History  . Marital status: Single    Spouse name: Not on file  . Number of children: 0  . Years of education: 11th grade  . Highest education level: Not on file  Occupational History  . Occupation: unemployed    Comment: has never worked  Tobacco Use  . Smoking status: Never Smoker  . Smokeless tobacco: Never Used  Substance  and Sexual Activity  . Alcohol use: Not Currently  . Drug use: No  . Sexual activity: Yes    Birth control/protection: None  Other Topics Concern  . Not on file  Social History Narrative   Patient lives in [place] mother lives in [place].  Unemployed.  Previously worked for a IT consultant.  Completed 11 grade working on [employer]. Patient 3 brothers    Social Determinants of Radio broadcast assistant Strain:   . Difficulty of Paying Living  Expenses:   Food Insecurity: No Food Insecurity  . Worried About Charity fundraiser in the Last Year: Never true  . Ran Out of Food in the Last Year: Never true  Transportation Needs: No Transportation Needs  . Lack of Transportation (Medical): No  . Lack of Transportation (Non-Medical): No  Physical Activity:   . Days of Exercise per Week:   . Minutes of Exercise per Session:   Stress:   . Feeling of Stress :   Social Connections:   . Frequency of Communication with Friends and Family:   . Frequency of Social Gatherings with Friends and Family:   . Attends Religious Services:   . Active Member of Clubs or Organizations:   . Attends Archivist Meetings:   [name] Marital Status:     Family History  Problem Relation Age of Onset  . Asthma Mother   . Carpal tunnel syndrome Mother   . Gout Father   . Diabetes Paternal Grandmother   . Anesthesia problems Neg Hx     Allergies  Allergen Reactions  . Penicillins Hives and Rash    Has patient had a PCN reaction causing immediate rash, facial/tongue/throat swelling, SOB or lightheadedness with hypotension: Yes Has patient had a PCN reaction causing severe rash involving mucus membranes or skin necrosis: No Has patient had a PCN reaction that required hospitalization: Yes Has patient had a PCN reaction occurring within the last 10 years: No If all of the above answers are "NO", then may proceed with Cephalosporin use.  . Benadryl [Diphenhydramine] Itching  . Doxycycline Itching    Facility-Administered Medications Prior to Admission  Medication Dose Route Frequency Provider Last Rate Last Admin  . Insulin Pen Needle (NOVOFINE) 200 each  20 packet Subcutaneous PRN Allie Bossier, MD       Medications Prior to Admission  Medication Sig Dispense Refill Last Dose  . aspirin EC 81 MG tablet Take 1 tablet (81 mg total) by mouth daily. Take after 12 weeks for prevention of preeclampsia later in pregnancy 300 tablet 2 09/26/2019 at  Unknown time  . blood glucose meter kit and supplies KIT Dispense based on patient and insurance preference. Use up to four times daily as directed. (FOR ICD-9 250.00, 250.01). 1 each 0 09/26/2019 at Unknown time  . Blood Pressure Monitoring (BLOOD PRESSURE KIT) DEVI 1 Device by Does not apply route as needed. 1 each 0 09/26/2019 at Unknown time  . Continuous Blood Gluc Receiver (DEXCOM G6 RECEIVER) DEVI 1 Device by Does not apply route as directed. 1 Device 0 09/26/2019 at Unknown time  . Continuous Blood Gluc Sensor (DEXCOM G6 SENSOR) MISC USE AS DIRECTED. CHANGE SENSOR EVERY 10 DAYS 3 each 6 09/26/2019 at Unknown time  . Continuous Blood Gluc Transmit (DEXCOM G6 TRANSMITTER) MISC 1 Device by Does not apply route as directed. 1 each 3 09/26/2019 at Unknown time  . Glucagon HCl 1 MG SOLR Inject 1 mg as directed as directed. 1 each  1 09/26/2019 at Unknown time  . insulin aspart (NOVOLOG) 100 UNIT/ML injection Inject 30 Units into the skin as directed. Per pump settings 30 mL 1 09/26/2019 at Unknown time  . Insulin Disposable Pump (OMNIPOD DASH 5 PACK PODS) MISC 1 Package by Does not apply route as directed. 6 each 3 09/26/2019 at Unknown time  . INSULIN SYRINGE .5CC/29G (SAFETY INSULIN SYRINGES) 29G X 1/2" 0.5 ML MISC 1 Device by Does not apply route 3 (three) times daily. 100 each 3 09/26/2019 at Unknown time  . metroNIDAZOLE (FLAGYL) 500 MG tablet Take 1 tablet (500 mg total) by mouth 2 (two) times daily. 14 tablet 0 Past Week at Unknown time  . Prenatal Vit-Fe Fumarate-FA (PREPLUS) 27-1 MG TABS Take 1 tablet by mouth daily.   09/26/2019 at Unknown time  . Ferrous Sulfate (IRON) 325 (65 Fe) MG TABS Take 1 tablet (325 mg total) by mouth every other day. 30 tablet 1   . fluticasone (FLONASE) 50 MCG/ACT nasal spray Place 1-2 sprays into both nostrils daily for 14 days. 16 g 0   . pantoprazole (PROTONIX) 20 MG tablet Take 1 tablet (20 mg total) by mouth daily. Can increase to bid prn 30 tablet 1 Unknown at Unknown  time    Review of Systems - Negative except labial swelling & pain  Vitals:  BP 129/86 (BP Location: Left Arm)   Pulse (!) 110   Temp 97.7 F (36.5 C) (Oral)   Resp 18   LMP  (LMP Unknown)  Physical Examination: CONSTITUTIONAL: Well-developed, well-nourished female in no acute distress.  HENT:  Normocephalic, atraumatic, External right and left ear normal. Oropharynx is clear and moist EYES: Conjunctivae and EOM are normal. Pupils are equal, round, and reactive to light. No scleral icterus.  NECK: Normal range of motion, supple, no masses SKIN: Skin is warm and dry. No rash noted. Not diaphoretic. No erythema. No pallor. [place]: Alert and oriented to person, place, and time. Normal reflexes, muscle tone coordination. No cranial nerve deficit noted. PSYCHIATRIC: Normal mood and affect. Normal behavior. Normal judgment and thought content. CARDIOVASCULAR: Normal heart rate noted, regular rhythm RESPIRATORY: Effort and breath sounds normal, no problems with respiration noted ABDOMEN: Soft, nontender, nondistended, gravid. MUSCULOSKELETAL: Normal range of motion. No edema and no tenderness. 2+ distal pulses. GU: right labia swollen from clitoris to perineum with no focal lesion. No drainage or erythema.   Labs:  No results found for this or any previous visit (from the past 24 hour(s)).  Imaging Studies: No results found.   Assessment and Plan: 1. Vulvar cellulitis   2. Type 1 diabetes mellitus with hyperglycemia (HCC)   3. Chronic hypertension during pregnancy, antepartum    -Will admit to [facility] unit for IV antibiotics.  Jorje Guild, NP 09/27/2019 2:46 AM

## 2019-09-28 ENCOUNTER — Encounter (HOSPITAL_COMMUNITY)
Admission: AD | Disposition: A | Discharge status: Home / Self Care | Payer: Self-pay | Source: Home / Self Care | Attending: Obstetrics and Gynecology

## 2019-09-28 ENCOUNTER — Inpatient Hospital Stay (HOSPITAL_COMMUNITY): Payer: BLUE CROSS/BLUE SHIELD | Admitting: Anesthesiology

## 2019-09-28 HISTORY — PX: INCISION AND DRAINAGE ABSCESS: SHX5864

## 2019-09-28 LAB — CBC WITH DIFFERENTIAL/PLATELET
Abs Immature Granulocytes: 0.18 10*3/uL — ABNORMAL HIGH (ref 0.00–0.07)
Abs Immature Granulocytes: 0.14 10*3/uL — ABNORMAL HIGH (ref 0.00–0.07)
Basophils Absolute: 0 10*3/uL (ref 0.0–0.1)
Basophils Absolute: 0 10*3/uL (ref 0.0–0.1)
Basophils Relative: 0 %
Basophils Relative: 0 %
Eosinophils Absolute: 0.2 10*3/uL (ref 0.0–0.5)
Eosinophils Absolute: 0.2 10*3/uL (ref 0.0–0.5)
Eosinophils Relative: 3 %
Eosinophils Relative: 2 %
HCT: 24.7 % — ABNORMAL LOW (ref 36.0–46.0)
HCT: 24.2 % — ABNORMAL LOW (ref 36.0–46.0)
Hemoglobin: 8 g/dL — ABNORMAL LOW (ref 12.0–15.0)
Hemoglobin: 8.1 g/dL — ABNORMAL LOW (ref 12.0–15.0)
Immature Granulocytes: 2 %
Immature Granulocytes: 1 %
Lymphocytes Relative: 22 %
Lymphocytes Relative: 17 %
Lymphs Abs: 2.1 10*3/uL (ref 0.7–4.0)
Lymphs Abs: 1.7 10*3/uL (ref 0.7–4.0)
MCH: 28.5 pg (ref 26.0–34.0)
MCH: 29.2 pg (ref 26.0–34.0)
MCHC: 32.4 g/dL (ref 30.0–36.0)
MCHC: 33.5 g/dL (ref 30.0–36.0)
MCV: 87.9 fL (ref 80.0–100.0)
MCV: 87.4 fL (ref 80.0–100.0)
Monocytes Absolute: 0.8 10*3/uL (ref 0.1–1.0)
Monocytes Absolute: 0.8 10*3/uL (ref 0.1–1.0)
Monocytes Relative: 9 %
Monocytes Relative: 8 %
Neutro Abs: 6.1 10*3/uL (ref 1.7–7.7)
Neutro Abs: 7.2 10*3/uL (ref 1.7–7.7)
Neutrophils Relative %: 64 %
Neutrophils Relative %: 72 %
Platelets: 250 10*3/uL (ref 150–400)
Platelets: 249 10*3/uL (ref 150–400)
RBC: 2.81 MIL/uL — ABNORMAL LOW (ref 3.87–5.11)
RBC: 2.77 MIL/uL — ABNORMAL LOW (ref 3.87–5.11)
RDW: 13.8 % (ref 11.5–15.5)
RDW: 14 % (ref 11.5–15.5)
WBC: 9.5 10*3/uL (ref 4.0–10.5)
WBC: 10.1 10*3/uL (ref 4.0–10.5)
nRBC: 0 % (ref 0.0–0.2)
nRBC: 0 % (ref 0.0–0.2)

## 2019-09-28 SURGERY — INCISION AND DRAINAGE, ABSCESS
Anesthesia: Spinal | Site: Vulva | Laterality: Left

## 2019-09-28 MED ORDER — FENTANYL CITRATE (PF) 100 MCG/2ML IJ SOLN: 25.0000 ug | INTRAMUSCULAR | Status: DC | PRN

## 2019-09-28 MED ORDER — SODIUM CHLORIDE 0.9 % IV SOLN: INTRAVENOUS | Status: DC

## 2019-09-28 MED ORDER — PROPOFOL 1000 MG/100ML IV EMUL
INTRAVENOUS | Status: AC
  Filled 2019-09-28: qty 100

## 2019-09-28 MED ORDER — OXYCODONE HCL 5 MG/5ML PO SOLN: 5.0000 mg | Freq: Once | ORAL | Status: DC | PRN

## 2019-09-28 MED ORDER — BUPIVACAINE IN DEXTROSE 0.75-8.25 % IT SOLN
INTRATHECAL | Status: DC | PRN
  Administered 2019-09-28: 1.2 mL via INTRATHECAL

## 2019-09-28 MED ORDER — OXYCODONE HCL 5 MG PO TABS: 5.0000 mg | ORAL_TABLET | Freq: Once | ORAL | Status: DC | PRN

## 2019-09-28 MED ORDER — LIDOCAINE-EPINEPHRINE 1 %-1:100000 IJ SOLN
INTRAMUSCULAR | Status: AC
  Filled 2019-09-28: qty 1

## 2019-09-28 MED ORDER — PHENYLEPHRINE 40 MCG/ML (10ML) SYRINGE FOR IV PUSH (FOR BLOOD PRESSURE SUPPORT)
PREFILLED_SYRINGE | INTRAVENOUS | Status: AC
  Filled 2019-09-28: qty 10

## 2019-09-28 MED ORDER — FENTANYL CITRATE (PF) 250 MCG/5ML IJ SOLN
INTRAMUSCULAR | Status: AC
  Filled 2019-09-28: qty 5

## 2019-09-28 MED ORDER — ONDANSETRON HCL 4 MG/2ML IJ SOLN
INTRAMUSCULAR | Status: DC | PRN
  Administered 2019-09-28: 4 mg via INTRAVENOUS

## 2019-09-28 MED ORDER — PROMETHAZINE HCL 25 MG/ML IJ SOLN
6.2500 mg | INTRAMUSCULAR | Status: DC | PRN
  Administered 2019-09-28: 6.25 mg via INTRAVENOUS

## 2019-09-28 MED ORDER — DEXTROSE 50 % IV SOLN
INTRAVENOUS | Status: DC | PRN
  Administered 2019-09-28: .5 via INTRAVENOUS

## 2019-09-28 MED ORDER — PROPOFOL 10 MG/ML IV BOLUS
INTRAVENOUS | Status: AC
  Filled 2019-09-28: qty 20

## 2019-09-28 MED ORDER — PROMETHAZINE HCL 25 MG/ML IJ SOLN
INTRAMUSCULAR | Status: AC
  Filled 2019-09-28: qty 1

## 2019-09-28 MED ORDER — HYDROMORPHONE HCL 1 MG/ML IJ SOLN
1.0000 mg | INTRAMUSCULAR | Status: DC | PRN
  Administered 2019-09-28: 1 mg via INTRAVENOUS
  Filled 2019-09-28: qty 1

## 2019-09-28 MED ORDER — LACTATED RINGERS IV SOLN: INTRAVENOUS | Status: DC | PRN

## 2019-09-28 MED ORDER — DEXTROSE 50 % IV SOLN
INTRAVENOUS | Status: AC
  Filled 2019-09-28: qty 50

## 2019-09-28 MED ORDER — PHENYLEPHRINE 40 MCG/ML (10ML) SYRINGE FOR IV PUSH (FOR BLOOD PRESSURE SUPPORT)
PREFILLED_SYRINGE | INTRAVENOUS | Status: DC | PRN
  Administered 2019-09-28 (×7): 40 ug via INTRAVENOUS

## 2019-09-28 SURGICAL SUPPLY — 24 items
CONT PATH 16OZ SNAP LID 3702 (MISCELLANEOUS) ×3 IMPLANT
DRSG VAC ATS SM SENSATRAC (GAUZE/BANDAGES/DRESSINGS) IMPLANT
GAUZE PACKING 2X5 YD STRL (GAUZE/BANDAGES/DRESSINGS) ×3 IMPLANT
GAUZE SPONGE 4X4 12PLY STRL (GAUZE/BANDAGES/DRESSINGS) ×3 IMPLANT
GLOVE BIOGEL PI IND STRL 7.0 (GLOVE) ×1 IMPLANT
GLOVE BIOGEL PI IND STRL 9 (GLOVE) ×1 IMPLANT
GLOVE BIOGEL PI INDICATOR 7.0 (GLOVE) ×2
GLOVE BIOGEL PI INDICATOR 9 (GLOVE) ×2
GLOVE SS PI 9.0 STRL (GLOVE) ×3 IMPLANT
GOWN STRL REUS W/ TWL LRG LVL3 (GOWN DISPOSABLE) ×2 IMPLANT
GOWN STRL REUS W/TWL 2XL LVL3 (GOWN DISPOSABLE) ×3 IMPLANT
GOWN STRL REUS W/TWL LRG LVL3 (GOWN DISPOSABLE) ×6
HIBICLENS CHG 4% 4OZ BTL (MISCELLANEOUS) ×3 IMPLANT
NEEDLE 18GX1X1/2 (RX/OR ONLY) (NEEDLE) ×3 IMPLANT
NEEDLE SPNL 22GX3.5 QUINCKE BK (NEEDLE) IMPLANT
NS IRRIG 1000ML POUR BTL (IV SOLUTION) ×3 IMPLANT
PACK VAGINAL MINOR WOMEN LF (CUSTOM PROCEDURE TRAY) ×3 IMPLANT
PAD PREP 24X48 CUFFED NSTRL (MISCELLANEOUS) ×3 IMPLANT
PENCIL BUTTON HOLSTER BLD 10FT (ELECTRODE) ×3 IMPLANT
PREVENA INCISION MGT 90 150 (MISCELLANEOUS) ×3 IMPLANT
SYR CONTROL 10ML LL (SYRINGE) IMPLANT
TAPE CLOTH SURG 4X10 WHT LF (GAUZE/BANDAGES/DRESSINGS) ×3 IMPLANT
TOWEL OR 17X24 6PK STRL BLUE (TOWEL DISPOSABLE) ×6 IMPLANT
TRAY FOLEY MTR SLVR 14FR STAT (SET/KITS/TRAYS/PACK) ×3 IMPLANT

## 2019-09-28 NOTE — Anesthesia Postprocedure Evaluation (Signed)
Anesthesia Post Note  Patient: Ashley Freeman  Procedure(s) Performed: INCISION AND DRAINAGE VULVAR ABCESS (Left Vulva)     Patient location during evaluation: PACU Anesthesia Type: Spinal Level of consciousness: awake and alert Pain management: pain level controlled Vital Signs Assessment: post-procedure vital signs reviewed and stable Respiratory status: spontaneous breathing and respiratory function stable Cardiovascular status: blood pressure returned to baseline and stable Postop Assessment: spinal receding and no apparent nausea or vomiting Anesthetic complications: no    Last Vitals:  Vitals:   09/28/19 1622 09/28/19 1630  BP: (!) 164/99   Pulse:  (!) 110  Resp:  17  Temp:    SpO2: 100% 100%    Last Pain:  Vitals:   09/28/19 1622  TempSrc:   PainSc: 0-No pain                 Audry Pili

## 2019-09-28 NOTE — Progress Notes (Signed)
Pt taken to main OR via stretcher.  Report given to CRNA.

## 2019-09-28 NOTE — Progress Notes (Signed)
FACULTY PRACTICE ANTEPARTUM(COMPREHENSIVE) NOTE  Ashley Freeman is a 29 y.o. G2P0010 at [redacted]w[redacted]d  who is admitted for severe vulvar edema along entire right labia majora of several days duration. She has had a prior vulvar abscess on this side, with prior I & D at the inferior aspects of the right labia majora, and that is the site of her greatest pain..   Fetal presentation is cephalic. Length of Stay:  1  Days  Subjective: Pt reports unchanged marked pain in right l. Majora. She can allow palpation of the vulva today which is slightly better tolerated than before. She began to have tissue weeping this morning in the area of the prior incision, currently that is clear, nonpurulent, so I believe tissue breakdown is inevitable , and I & D indicated. Perineal u/s photos do not clearly delineate a focal collection yesterday, so this will be primarily exploration to Identify the suspected source. Buttock/ gluteal area and rectal area is nontender, so not felt to be associated with the rectum. Patient reports the fetal movement as active. Patient reports uterine contraction  activity as none. Patient reports  vaginal bleeding as none. Patient describes fluid per vagina as None.  Vitals:  Blood pressure (!) 150/96, pulse (!) 107, temperature 98.2 F (36.8 C), temperature source Oral, resp. rate 18, height 5\' 3"  (1.6 m), weight 75.3 kg, SpO2 100 %. Physical Examination:  General appearance - oriented to person, place, and time, normal appearing weight and in mild to moderate distress Heart - normal rate and regular rhythm Abdomen - soft, nontender, nondistended Fundal Height:  size equals dates Cervical Exam: Not evaluated.  Extremities: extremities normal, atraumatic, no cyanosis or edema and Homans sign is negative, no sign of DVT with DTRs 2+ bilaterally Membranes:intact  Perineal u/s photos do not clearly delineate a focal collection yesterday, so this will be primarily exploration to  Identify the suspected source. Buttock/ gluteal area and rectal area is nontender, so not felt to be associated with the rectum. PERINEUM; marked edema and swelling with worst soreness along small area of increased hardness deep beneath old incision line on right L majora inferior aspects.  Fetal Monitoring:  Baseline: 140 bpm, Variability: Good {> 6 bpm), Accelerations: Reactive, Decelerations: Absent and uterus nontender.  Labs:  Results for orders placed or performed during the hospital encounter of 09/27/19 (from the past 24 hour(s))  Basic metabolic panel   Collection Time: 09/27/19 11:48 AM  Result Value Ref Range   Sodium 137 135 - 145 mmol/L   Potassium 4.1 3.5 - 5.1 mmol/L   Chloride 112 (H) 98 - 111 mmol/L   CO2 16 (L) 22 - 32 mmol/L   Glucose, Bld 136 (H) 70 - 99 mg/dL   BUN 18 6 - 20 mg/dL   Creatinine, Ser 1.09 (H) 0.44 - 1.00 mg/dL   Calcium 8.0 (L) 8.9 - 10.3 mg/dL   GFR calc non Af Amer >60 >60 mL/min   GFR calc Af Amer >60 >60 mL/min   Anion gap 9 5 - 15  CBC with Differential/Platelet   Collection Time: 09/28/19  6:31 AM  Result Value Ref Range   WBC 9.5 4.0 - 10.5 K/uL   RBC 2.81 (L) 3.87 - 5.11 MIL/uL   Hemoglobin 8.0 (L) 12.0 - 15.0 g/dL   HCT 24.7 (L) 36.0 - 46.0 %   MCV 87.9 80.0 - 100.0 fL   MCH 28.5 26.0 - 34.0 pg   MCHC 32.4 30.0 - 36.0 g/dL   RDW  13.8 11.5 - 15.5 %   Platelets 250 150 - 400 K/uL   nRBC 0.0 0.0 - 0.2 %   Neutrophils Relative % 64 %   Neutro Abs 6.1 1.7 - 7.7 K/uL   Lymphocytes Relative 22 %   Lymphs Abs 2.1 0.7 - 4.0 K/uL   Monocytes Relative 9 %   Monocytes Absolute 0.8 0.1 - 1.0 K/uL   Eosinophils Relative 3 %   Eosinophils Absolute 0.2 0.0 - 0.5 K/uL   Basophils Relative 0 %   Basophils Absolute 0.0 0.0 - 0.1 K/uL   Immature Granulocytes 2 %   Abs Immature Granulocytes 0.18 (H) 0.00 - 0.07 K/uL   CURRENT CBG 122 Imaging Studies:    CLINICAL DATA:  Vulvar cellulitis on the right.  Diabetes mellitus.  EXAM: LIMITED  ULTRASOUND OF PELVIS: VULVAR ULTRASOUND  TECHNIQUE: Longitudinal and transverse images of the vulva obtained.  COMPARISON:  None.  FINDINGS: There is diffuse soft tissue edema throughout the right vulvar region without well-defined fluid collection or abscess. Left vulva appears essentially unremarkable.  IMPRESSION: Extensive vulvar cellulitis on the right without well-defined fluid collection or mass. Left vulva does not appear significantly inflamed by ultrasound.   Electronically Signed   By: Lowella Grip III M.D.   On: 09/27/2019 14:55   Medications:  Scheduled . aspirin EC  81 mg Oral Daily  . docusate sodium  100 mg Oral Daily  . insulin aspart  30 Units Subcutaneous UD  . pantoprazole  20 mg Oral Daily  . prenatal multivitamin  1 tablet Oral Q1200   I have reviewed the patient's current medications.  ASSESSMENT: Patient Active Problem List   Diagnosis Date Noted  . Vulvar cellulitis 09/27/2019  . Bilateral leg edema 09/27/2019  . Systolic ejection murmur 65/78/4696  . Shortness of breath 08/03/2019  . Type 1 diabetes mellitus with hyperglycemia ([place]) 07/24/2019  . Chronic hypertension during pregnancy, antepartum 07/14/2019  . Diabetic retinopathy ([place]) 07/02/2019  . Proteinuria due to type 1 diabetes mellitus ([place]) 05/21/2019  . Proteinuria complicating pregnancy 29/52/8413  . Supervision of high risk pregnancy, antepartum 05/06/2019  . Pre-existing type 1 diabetes mellitus in pregnancy in third trimester 05/06/2019  . Diabetic neuropathy ([place]) 01/16/2016  . GBS (group b Streptococcus) UTI complicating pregnancy, first trimester 04/08/2014  . Diabetic ketoacidosis without coma associated with type 1 diabetes mellitus ([place]) 09/19/2013  . Diabetes mellitus type 1, uncontrolled, with complications ([place]) 24/40/1027    PLAN: TO OR today for Incision and Drainage of right labia Majora  Due to suspected labial abscess.  [name] 09/28/2019,10:00 AM    Patient ID: [name], female   DOB: [birth year], 29 y.o.   MRN: [id number]

## 2019-09-28 NOTE — Progress Notes (Signed)
FHR monitored in OR during procedure and in PACU for 15 min as ordered by Dr. Glo Herring.

## 2019-09-28 NOTE — Transfer of Care (Signed)
Immediate Anesthesia Transfer of Care Note  Patient: Ashley Freeman  Procedure(s) Performed: INCISION AND DRAINAGE VULVAR ABCESS (N/A )  Patient Location: PACU  Anesthesia Type:Regional  Level of Consciousness: awake, alert , oriented and patient cooperative  Airway & Oxygen Therapy: Patient Spontanous Breathing  Post-op Assessment: Report given to RN  Post vital signs: Reviewed and stable  Last Vitals:  Vitals Value Taken Time  BP 134/86 09/28/19 1426  Temp    Pulse 101 09/28/19 1427  Resp 19 09/28/19 1427  SpO2 100 % 09/28/19 1427  Vitals shown include unvalidated device data.  Last Pain:  Vitals:   09/28/19 1136  TempSrc: Oral  PainSc:       Patients Stated Pain Goal: 5 (32/35/57 3220)  Complications: No apparent anesthesia complications

## 2019-09-28 NOTE — Op Note (Signed)
09/28/2019  4:23 PM  PATIENT:  Ashley Freeman  29 y.o. female  PRE-OPERATIVE DIAGNOSIS:  VULVAR ABCESS  POST-OPERATIVE DIAGNOSIS:  VULVAR edema, no abscess locatable  PROCEDURE:  Procedure(s): INCISION AND DRAINAGE VULVAR ABCESS (Left) and edema left labia majora  SURGEON:  Surgeon(s) and Role:    [name], MD - Primary  PHYSICIAN ASSISTANT:   ASSISTANTS: none   ANESTHESIA:   spinal  EBL:  10 mL  Over 200 cc of clear nonpurulent without odor serous fluid poured from the right labia opening  BLOOD ADMINISTERED:none  DRAINS: Urinary Catheter (Foley)   LOCAL MEDICATIONS USED:  NONE  SPECIMEN:  No Specimen aspirate sent for culture  DISPOSITION OF SPECIMEN:  Aspirate to laboratory for culture  COUNTS:  YES , Correct TOURNIQUET:  * No tourniquets in log *  DICTATION: .Dragon Dictation  PLAN OF CARE: Patient has active inpatient orders  PATIENT DISPOSITION:  PACU - hemodynamically stable.   Delay start of Pharmacological VTE agent (>24hrs) due to surgical blood loss or risk of bleeding: not applicable Indications type 1 diabetes mellitus pregnant at [redacted] weeks with swelling and marked discomfort in right labia majora in the area of a previous labial abscess/Bartholin's abscess which had been previously drained through the lower lateral aspects of the right labia majora Findings: Markedly edematous right labia majora from above the clitoris to the posterior fourchette, having progressed dramatically in the last 4 hours since decision for surgery.  Extensive watery edema throughout the right labia majora.  No areas that showed local heat, or purulent fluid.  Multiple aspirations sites along the labia majora identified only clear fluid Details of procedure patient was taken to the operating room spinal anesthesia introduced and perineum prepped and draped.  She had received IV antibiotics Zosyn and Flagyl as per prior antibiotic regimen.  Timeout was  conducted and procedure confirmed by operative team. Examination was performed including speculum exam which showed no area of suspicion along the vagina.  The area of the perirectal area was similarly normal in appearance. Careful massage of the inner aspects of the right labia majora were performed to try to identify any area that might represent a Bartholin's cyst or other abscess.  There was only translucent tissues, which had expanded even markedly since this morning  There was some serous fluid weeping from the lateral aspects of the right labia majora at the site of the previous incision and drainage but no other defect or drainage site could be identified.  First needle aspiration was attempted several sites along the swollen labia majora with aspiration identifying only clear fluid which was sent for culture.  After careful massage we were unable to identify any other suspicious areas so vertical incision 2 cm in length was made along the previous incision site, extended deeply approximately 2 cm into the deep fatty tissues of the labia, and then tunneled both caudad to even with the posterior perineal body and cephalad for a distance of approximately 4 or 5 cm deep cool to reaching the level of the clitoris.  Massage only produced clear generous watery fluid.  200 cc of water fluid quickly collected in the pouch.  The labia diminished in size rather dramatically, reducing almost to the size of the opposite labia. The wound was first attempted to be packed with a wound VAC and it was contoured so that the vacuum could be applied over the mons pubis based on a channel of black gauze placed over the skin.  This was taped in place but we could never get a seal from the wound VAC over the posterior perineal body due to the continued flow of watery fluid from the incision.  Efforts to place wound VAC were then abandoned. The defect was then packed with a clear dry 2 inch gauze and then overlaid with 4 x  4's and an ABD pad.  Foley catheter was inserted to allow for voiding without contamination of the dressing which was then taped in place by the team.  Sponge and needle counts were correct. Patient will go back to antenatal unit continue IV antibiotics for the present with monitoring of INO and incision checks

## 2019-09-28 NOTE — Brief Op Note (Addendum)
09/28/2019  4:23 PM  PATIENT:  Ashley Freeman  29 y.o. female  PRE-OPERATIVE DIAGNOSIS:  VULVAR ABCESS  POST-OPERATIVE DIAGNOSIS:  VULVAR edema, no abscess locatable  PROCEDURE:  Procedure(s): INCISION AND DRAINAGE VULVAR ABCESS (Left) and edema left labia majora  SURGEON:  Surgeon(s) and Role:    [name], MD - Primary  PHYSICIAN ASSISTANT:   ASSISTANTS: none   ANESTHESIA:   spinal  EBL:  10 mL  Over 200 cc of clear nonpurulent without odor serous fluid poured from the right labia opening  BLOOD ADMINISTERED:none  DRAINS: Urinary Catheter (Foley)   LOCAL MEDICATIONS USED:  NONE  SPECIMEN:  No Specimen aspirate sent for culture  DISPOSITION OF SPECIMEN:  Aspirate to laboratory for culture  COUNTS:  YES , Correct TOURNIQUET:  * No tourniquets in log *  DICTATION: .Dragon Dictation  PLAN OF CARE: Patient has active inpatient orders  PATIENT DISPOSITION:  PACU - hemodynamically stable.   Delay start of Pharmacological VTE agent (>24hrs) due to surgical blood loss or risk of bleeding: not applicable Indications type 1 diabetes mellitus pregnant at [redacted] weeks with swelling and marked discomfort in right labia majora in the area of a previous labial abscess/Bartholin's abscess which had been previously drained through the lower lateral aspects of the right labia majora Findings: Markedly edematous right labia majora from above the clitoris to the posterior fourchette, having progressed dramatically in the last 4 hours since decision for surgery.  Extensive watery edema throughout the right labia majora.  No areas that showed local heat, or purulent fluid.  Multiple aspirations sites along the labia majora identified only clear fluid Details of procedure patient was taken to the operating room spinal anesthesia introduced and perineum prepped and draped.  She had received IV antibiotics Zosyn and Flagyl as per prior antibiotic regimen.  Timeout was  conducted and procedure confirmed by operative team. Examination was performed including speculum exam which showed no area of suspicion along the vagina.  The area of the perirectal area was similarly normal in appearance. Careful massage of the inner aspects of the right labia majora were performed to try to identify any area that might represent a Bartholin's cyst or other abscess.  There was only translucent tissues, which had expanded even markedly since this morning  There was some serous fluid weeping from the lateral aspects of the right labia majora at the site of the previous incision and drainage but no other defect or drainage site could be identified.  First needle aspiration was attempted several sites along the swollen labia majora with aspiration identifying only clear fluid which was sent for culture.  After careful massage we were unable to identify any other suspicious areas so vertical incision 2 cm in length was made along the previous incision site, extended deeply approximately 2 cm into the deep fatty tissues of the labia, and then tunneled both caudad to even with the posterior perineal body and cephalad for a distance of approximately 4 or 5 cm deep cool to reaching the level of the clitoris.  Massage only produced clear generous watery fluid.  200 cc of water fluid quickly collected in the pouch.  The labia diminished in size rather dramatically, reducing almost to the size of the opposite labia. The wound was first attempted to be packed with a wound VAC and it was contoured so that the vacuum could be applied over the mons pubis based on a channel of black gauze placed over the skin.  This was taped in place but we could never get a seal from the wound VAC over the posterior perineal body due to the continued flow of watery fluid from the incision.  Efforts to place wound VAC were then abandoned. The defect was then packed with a clear dry 2 inch gauze and then overlaid with 4 x  4's and an ABD pad.  Foley catheter was inserted to allow for voiding without contamination of the dressing which was then taped in place by the team.  Sponge and needle counts were correct. Patient will go back to antenatal unit continue IV antibiotics for the present with monitoring of INO and incision checks

## 2019-09-28 NOTE — Progress Notes (Signed)
CBG per pt's Dexacom 97 after eating 95% sandwich and 25% cup of applesauce, drinking ginger ale

## 2019-09-28 NOTE — Progress Notes (Signed)
CBG 76 per pt's Dexacom ... offred sandwich and applesauce

## 2019-09-28 NOTE — Anesthesia Preprocedure Evaluation (Addendum)
Anesthesia Evaluation  Patient identified by MRN, date of birth, ID band Patient awake    Reviewed: Allergy & Precautions, NPO status , Patient's Chart, lab work & pertinent test results  History of Anesthesia Complications Negative for: history of anesthetic complications  Airway Mallampati: III  TM Distance: >3 FB Neck ROM: Full    Dental  (+) Dental Advisory Given, Teeth Intact   Pulmonary neg pulmonary ROS,    Pulmonary exam normal        Cardiovascular negative cardio ROS Normal cardiovascular exam   '21 TTE - EF 60 to 65%. Indeterminate diastolic filling due to E-A fusion. Trivial MR    Neuro/Psych negative neurological ROS  negative psych ROS   GI/Hepatic negative GI ROS, Neg liver ROS,   Endo/Other  diabetes, Well Controlled, Type 1, Insulin Dependent  Renal/GU negative Renal ROS     Musculoskeletal negative musculoskeletal ROS (+)   Abdominal   Peds  Hematology  (+) anemia ,  Plt 250k    Anesthesia Other Findings Covid neg 5/30  Reproductive/Obstetrics (+) Pregnancy                           Anesthesia Physical Anesthesia Plan  ASA: II  Anesthesia Plan: Spinal   Post-op Pain Management:    Induction:   PONV Risk Score and Plan: 2 and Treatment may vary due to age or medical condition and Ondansetron  Airway Management Planned: Natural Airway and Nasal Cannula  Additional Equipment: None  Intra-op Plan:   Post-operative Plan:   Informed Consent: I have reviewed the patients History and Physical, chart, labs and discussed the procedure including the risks, benefits and alternatives for the proposed anesthesia with the patient or authorized representative who has indicated his/her understanding and acceptance.       Plan Discussed with: CRNA and Anesthesiologist  Anesthesia Plan Comments: (Labs reviewed, platelets acceptable. Discussed risks and benefits of  spinal, including spinal/epidural hematoma, infection, failed block, and PDPH. Patient expressed understanding and wished to proceed. )      Anesthesia Quick Evaluation

## 2019-09-28 NOTE — Anesthesia Procedure Notes (Signed)
Spinal  Patient location during procedure: OR Start time: 09/28/2019 1:20 PM End time: 09/28/2019 1:29 PM Staffing Performed: anesthesiologist  Anesthesiologist: Audry Pili, MD Preanesthetic Checklist Completed: patient identified, IV checked, risks and benefits discussed, surgical consent, monitors and equipment checked, pre-op evaluation and timeout performed Spinal Block Patient position: sitting Prep: DuraPrep Patient monitoring: heart rate, cardiac monitor, continuous pulse ox and blood pressure Approach: midline Location: L3-4 Injection technique: single-shot Needle Needle type: Quincke  Needle gauge: 22 G Additional Notes Consent was obtained prior to the procedure with all questions answered and concerns addressed. Risks including, but not limited to, bleeding, infection, nerve damage, paralysis, failed block, inadequate analgesia, allergic reaction, high spinal, itching, and headache were discussed and the patient wished to proceed. Functioning IV was confirmed and monitors were applied. Sterile prep and drape, including hand hygiene, mask, and sterile gloves were used. The patient was positioned and the spine was prepped. The skin was anesthetized with lidocaine. Patient in poor position for spinal due to pain and inability to tolerate putting pressure on vaginal area when sitting. Abandoned Pencan after unsuccessful attempt in favor of 22ga Quincke, which eventually led to finding intrathecal space despite poor positioning. Free flow of clear CSF was obtained prior to injecting local anesthetic into the CSF. The spinal needle aspirated freely following injection. The needle was carefully withdrawn. The patient tolerated the procedure well.   [name], MD

## 2019-09-29 LAB — LACTIC ACID, PLASMA
Lactic Acid, Venous: 0.6 mmol/L (ref 0.5–1.9)
Lactic Acid, Venous: 0.5 mmol/L (ref 0.5–1.9)

## 2019-09-29 MED ORDER — ACETAMINOPHEN 500 MG PO TABS: 1000.0000 mg | ORAL_TABLET | Freq: Four times a day (QID) | ORAL | Status: DC | PRN

## 2019-09-29 MED ORDER — AZITHROMYCIN 250 MG PO TABS
1000.0000 mg | ORAL_TABLET | Freq: Once | ORAL | Status: AC
  Administered 2019-09-29: 1000 mg via ORAL
  Filled 2019-09-29: qty 4

## 2019-09-29 MED ORDER — METRONIDAZOLE 500 MG PO TABS
500.0000 mg | ORAL_TABLET | Freq: Two times a day (BID) | ORAL | Status: DC
  Administered 2019-09-29 – 2019-10-03 (×9): 500 mg via ORAL
  Filled 2019-09-29 (×9): qty 1

## 2019-09-29 MED ORDER — OXYCODONE HCL 5 MG PO TABS
5.0000 mg | ORAL_TABLET | ORAL | Status: DC | PRN
  Administered 2019-09-29 – 2019-10-02 (×9): 10 mg via ORAL
  Administered 2019-10-03: 5 mg via ORAL
  Administered 2019-10-03 – 2019-10-04 (×2): 10 mg via ORAL
  Filled 2019-09-29 (×7): qty 2
  Filled 2019-09-29: qty 1
  Filled 2019-09-29 (×4): qty 2

## 2019-09-29 MED ORDER — INSULIN PUMP
Freq: Three times a day (TID) | SUBCUTANEOUS | Status: DC
  Filled 2019-09-29: qty 1

## 2019-09-29 NOTE — Progress Notes (Signed)
1 Day Post-Op Procedure(s) (LRB): INCISION AND DRAINAGE VULVAR ABCESS (Left)  Subjective: Patient reports incisional pain and tolerating PO.   Good fetal movement, no contractions Objective: I have reviewed patient's vital signs, intake and output and labs. Blood pressure 140/87, pulse (!) 108, temperature 98.1 F (36.7 C), temperature source Oral, resp. rate 18, height 5\' 3"  (1.6 m), weight 82.1 kg, SpO2 99 %.  General: alert, cooperative and no distress GI: gravid Right vulva edema decreased, serosanguinous drainage, pack removed  Assessment: s/p Procedure(s): INCISION AND DRAINAGE VULVAR ABCESS (Left): progressing well  Plan: Encourage ambulation. PO antibiotics  LOS: 2 days    Emeterio Reeve 09/29/2019, 9:46 AM

## 2019-09-29 NOTE — Progress Notes (Addendum)
Fasting CBG- 105 Pt administered 2.5 units of insulin at 10:00 am via insulin pump. 2 hr PP CBG @ 12 pm: 83 Pt administered 3.5 units of insulin at 1410 via insulin pump. CBG was 117. 2 hr PP CBG @ 1610: 105

## 2019-09-29 NOTE — Progress Notes (Addendum)
Pt admin. 4.95 units of insulin at [facility] for 154 CBG after meal via insulin pump   2hPP CBG @ 2111 was 107  Bedtime CBG was 113 @ 2200  RN called to pt room at 2250, pt states that she feels like her blood sugar dropped, no insulin was admin. via pump. CBG was 84.   0200 CBG was 96  Fasting CBG was 81

## 2019-09-29 NOTE — Progress Notes (Addendum)
Inpatient Diabetes Program Recommendations  AACE/ADA: New Consensus Statement on Inpatient Glycemic Control (2015)  Target Ranges:  Prepandial:   less than 140 mg/dL      Peak postprandial:   less than 180 mg/dL (1-2 hours)      Critically ill patients:  140 - 180 mg/dL   Lab Results  Component Value Date   GLUCAP 149 (H) 08/14/2019   HGBA1C 6.8 (H) 09/27/2019    Review of Glycemic Control Results for Ashley Freeman, Ashley Freeman (MRN [id number]) as of 09/29/2019 10:11  Ref. Range 09/27/2019 03:14 09/27/2019 11:48  Glucose Latest Ref Range: 70 - 99 mg/dL 138 (H) 136 (H)   Diabetes history: Type 1 DM Outpatient Diabetes medications: Omnipod  Current orders for Inpatient glycemic control: Omnipod, insulin pump Novolog 30 units QD  Inpatient Diabetes Program Recommendations:    Recommend discontinuing Novolog 30 units QD? And adding CBGs under the insulin pump order set.  Reached out to [place], RN to discuss and ensure proper documentation of CBGs.  Patient is seen by Dr Kelton Pillar, outpatient endocrinology.  Current insulin pump settings are as follows:  Basal insulin  0000-0800 0.60 units/hour 0801-0000 0.550 units/hour Total daily basal insulin: 13.6 units/24 hours  Carb Coverage 0000-1130 1:14 1 unit for every 14 grams of carbohydrates 1130-2200 1:12 1 unit for every 12 grams of carbohydrates 2200-0000 1:14 1 unit for every 14 grams of carbohydrates  Insulin Sensitivity 1:60 1 unit drops blood glucose 60 mg/dl  Target Glucose Goals 0000-0000 100 mg/dl  Addendum@ 1315: Spoke with patient to confirm insulin pump settings.  Reviewed patient's current A1c of 6.8%. Explained what a A1c is and what it measures. Also reviewed goal A1c with patient, importance of good glucose control @ home, and blood sugar goals. Reviewed patho of DM in pregnancy, insulin resistance changes in pregnancy and postpartum, impact of infection to glucose trends, vascular changes and commorbidities.   Patient is scheduled to follow up with Dr Kelton Pillar in the next two weeks. Reviewed insulin changes in the event of delivery and anticipated decreases to insulin rates. Encouraged to ask endocrinology for delivery plan. Patient has no further questions at this time.  NURSING: Once insulin pump order set is ordered please print off the Patient insulin pump contract and flow sheet. The insulin pump contract should be signed by the patient and then placed in the chart. The patient insulin pump flow sheet will be completed by the patient at the bedside and the RN caring for the patient will use the patient's flow sheet to document in the [facility]. RN will need to complete the Nursing Insulin Pump Flowsheet at least once a shift. Patient will need to keep extra insulin pump supplies at the bedside at all times.   Orders received from Dr Nehemiah Settle.    Thanks, Bronson Curb, MSN, RNC-OB Diabetes Coordinator [phone] (8a-5p)

## 2019-09-30 ENCOUNTER — Encounter (HOSPITAL_COMMUNITY): Payer: Self-pay | Admitting: Obstetrics and Gynecology

## 2019-09-30 ENCOUNTER — Telehealth: Payer: Self-pay | Admitting: Internal Medicine

## 2019-09-30 ENCOUNTER — Other Ambulatory Visit: Payer: Self-pay | Admitting: Internal Medicine

## 2019-09-30 ENCOUNTER — Inpatient Hospital Stay (HOSPITAL_COMMUNITY): Payer: BLUE CROSS/BLUE SHIELD

## 2019-09-30 LAB — CBC
HCT: 25.8 % — ABNORMAL LOW (ref 36.0–46.0)
Hemoglobin: 8.3 g/dL — ABNORMAL LOW (ref 12.0–15.0)
MCH: 28.4 pg (ref 26.0–34.0)
MCHC: 32.2 g/dL (ref 30.0–36.0)
MCV: 88.4 fL (ref 80.0–100.0)
Platelets: 264 10*3/uL (ref 150–400)
RBC: 2.92 MIL/uL — ABNORMAL LOW (ref 3.87–5.11)
RDW: 13.9 % (ref 11.5–15.5)
WBC: 9.4 10*3/uL (ref 4.0–10.5)
nRBC: 0 % (ref 0.0–0.2)

## 2019-09-30 LAB — COMPREHENSIVE METABOLIC PANEL
ALT: 35 U/L (ref 0–44)
AST: 52 U/L — ABNORMAL HIGH (ref 15–41)
Albumin: 1.6 g/dL — ABNORMAL LOW (ref 3.5–5.0)
Alkaline Phosphatase: 66 U/L (ref 38–126)
Anion gap: 9 (ref 5–15)
BUN: 12 mg/dL (ref 6–20)
CO2: 18 mmol/L — ABNORMAL LOW (ref 22–32)
Calcium: 7.7 mg/dL — ABNORMAL LOW (ref 8.9–10.3)
Chloride: 109 mmol/L (ref 98–111)
Creatinine, Ser: 1.2 mg/dL — ABNORMAL HIGH (ref 0.44–1.00)
GFR calc Af Amer: >60 mL/min (ref >60)
GFR calc non Af Amer: >60 mL/min (ref >60)
Glucose, Bld: 68 mg/dL — ABNORMAL LOW (ref 70–99)
Potassium: 4 mmol/L (ref 3.5–5.1)
Sodium: 136 mmol/L (ref 135–145)
Total Bilirubin: 0.4 mg/dL (ref 0.3–1.2)
Total Protein: 5.4 g/dL — ABNORMAL LOW (ref 6.5–8.1)

## 2019-09-30 LAB — MRSA PCR SCREENING: MRSA by PCR: NEGATIVE

## 2019-09-30 MED ORDER — ONDANSETRON HCL 4 MG/2ML IJ SOLN: 4.0000 mg | Freq: Four times a day (QID) | INTRAMUSCULAR | Status: DC | PRN

## 2019-09-30 MED ORDER — IOHEXOL 300 MG/ML  SOLN
100.0000 mL | Freq: Once | INTRAMUSCULAR | Status: AC | PRN
  Administered 2019-09-30: 100 mL via INTRAVENOUS

## 2019-09-30 MED ORDER — LABETALOL HCL 5 MG/ML IV SOLN: 40.0000 mg | INTRAVENOUS | Status: DC | PRN

## 2019-09-30 MED ORDER — LABETALOL HCL 5 MG/ML IV SOLN: 80.0000 mg | INTRAVENOUS | Status: DC | PRN

## 2019-09-30 MED ORDER — LABETALOL HCL 200 MG PO TABS
200.0000 mg | ORAL_TABLET | Freq: Two times a day (BID) | ORAL | Status: DC
  Administered 2019-09-30 – 2019-10-07 (×14): 200 mg via ORAL
  Filled 2019-09-30 (×3): qty 1
  Filled 2019-09-30 (×2): qty 2
  Filled 2019-09-30 (×9): qty 1

## 2019-09-30 MED ORDER — LABETALOL HCL 5 MG/ML IV SOLN: 20.0000 mg | INTRAVENOUS | Status: DC | PRN

## 2019-09-30 MED ORDER — OMNIPOD DASH PODS (GEN 4) MISC: 1.0000 | 3 refills | Status: DC

## 2019-09-30 MED ORDER — CLINDAMYCIN PHOSPHATE 600 MG/50ML IV SOLN
600.0000 mg | Freq: Three times a day (TID) | INTRAVENOUS | Status: DC
  Administered 2019-09-30 – 2019-10-03 (×10): 600 mg via INTRAVENOUS
  Filled 2019-09-30 (×12): qty 50

## 2019-09-30 NOTE — Progress Notes (Signed)
Patient ID: Mathilde Mcwherter, female   DOB: Mar 19, 1991, 29 y.o.   MRN: [id number]  Patient seen and examined due to increasing pain and swelling in right labia, mons, and into right inguinal area. Swelling has increased and is quite indurated. Stat CBC done.  Lab Results  Component Value Date   WBC 9.4 09/30/2019   HGB 8.3 (L) 09/30/2019   HCT 25.8 (L) 09/30/2019   MCV 88.4 09/30/2019   PLT 264 09/30/2019   I recommended CT scan to patient and explained risks of radiation exposure to baby, in particular early childhood cancers. A CT pelvis would give a radiation exposure of approximately 8 rads and it is recognized that radiation exposure in pregnancy of less than 10 rads is considered to not be significant to cause an increased risk of childhood cancer. Additionally, this may represent necrotizing cellulitis or necrotizing fasciitis and I think that a CT scan is necessary.  Truett Mainland, DO 09/30/2019 3:49 AM

## 2019-09-30 NOTE — Progress Notes (Signed)
omnipod pods

## 2019-09-30 NOTE — Telephone Encounter (Addendum)
I have spoke with the patient.  She will come in to see me for a review pump and CGM training, and to download her pump/dexcom to review as well as to make sure everything is linked

## 2019-09-30 NOTE — Telephone Encounter (Signed)
Medication Refill Request  Did you call your pharmacy and request this refill first? Yes  . If patient has not contacted pharmacy first, instruct them to do so for future refills.  . Remind them that contacting the pharmacy for their refill is the quickest method to get the refill.  . Refill policy also stated that it will take anywhere between 24-72 hours to receive the refill.    Name of medication? omnipods dash pods  Is this a 90 day supply? Patient unsure  Name and location of pharmacy?   [place] ([place]), [place] - [address] Phone:  [phone]  Fax:  [phone]

## 2019-09-30 NOTE — Progress Notes (Signed)
Patient ID: Ashley Freeman, female   DOB: [birth year], 29 y.o.   MRN: [id number]  [place]) NOTE  Ashley Freeman is a 29 y.o. G2P0010 at [redacted]w[redacted]d by best clinical estimate who is admitted for diabetes management and right vulvar infection s/p I&D.   Fetal presentation is cephalic. Length of Stay:  3  Days  Subjective: More vulvar pain and swelling Patient reports the fetal movement as active. Patient reports uterine contraction  activity as none. Patient reports  vaginal bleeding as none. Patient describes fluid per vagina as None.  Vitals:  Blood pressure (!) 159/102, pulse (!) 110, temperature 98 F (36.7 C), temperature source Oral, resp. rate 19, height 5\' 3"  (1.6 m), weight 85.7 kg, SpO2 98 %. Physical Examination:  General appearance - alert, well appearing, and in no distress Heart - normal rate and regular rhythm Abdomen - soft, nontender, nondistended Fundal Height:  size equals dates Cervical Exam: Not evaluated right vulvar more edema no significant rubra or induration Extremities: extremities normal, atraumatic Membranes:intact  Fetal Monitoring:     Fetal Heart Rate A  Mode External  [removed] filed at 09/30/2019 0238  Baseline Rate (A) 135 bpm filed at 09/30/2019 0222  Variability 6-25 BPM filed at 09/30/2019 0222  Accelerations 15 x 15 filed at 09/30/2019 0222  Decelerations None filed at 09/30/2019 0222     Labs:  Results for orders placed or performed during the hospital encounter of 09/27/19 (from the past 24 hour(s))  CBC   Collection Time: 09/30/19  3:15 AM  Result Value Ref Range   WBC 9.4 4.0 - 10.5 K/uL   RBC 2.92 (L) 3.87 - 5.11 MIL/uL   Hemoglobin 8.3 (L) 12.0 - 15.0 g/dL   HCT 25.8 (L) 36.0 - 46.0 %   MCV 88.4 80.0 - 100.0 fL   MCH 28.4 26.0 - 34.0 pg   MCHC 32.2 30.0 - 36.0 g/dL   RDW 13.9 11.5 - 15.5 %   Platelets 264 150 - 400 K/uL   nRBC 0.0 0.0 - 0.2 %  MRSA PCR Screening   Collection  Time: 09/30/19  6:29 AM   Specimen: Nasal Mucosa; Nasopharyngeal  Result Value Ref Range   MRSA by PCR NEGATIVE NEGATIVE   CBG (last 3)     Medications:  Scheduled . aspirin EC  81 mg Oral Daily  . docusate sodium  100 mg Oral Daily  . insulin pump   Subcutaneous TID PC,HS,0200  . labetalol  200 mg Oral BID  . metroNIDAZOLE  500 mg Oral Q12H  . pantoprazole  20 mg Oral Daily  . prenatal multivitamin  1 tablet Oral Q1200   I have reviewed the patient's current medications.  ASSESSMENT: Patient Active Problem List   Diagnosis Date Noted  . Vulvar cellulitis 09/27/2019  . Bilateral leg edema 09/27/2019  . Systolic ejection murmur 67/89/3810  . Shortness of breath 08/03/2019  . Type 1 diabetes mellitus with hyperglycemia ([place]) 07/24/2019  . Chronic hypertension during pregnancy, antepartum 07/14/2019  . Diabetic retinopathy ([place]) 07/02/2019  . Proteinuria due to type 1 diabetes mellitus ([place]) 05/21/2019  . Proteinuria complicating pregnancy 17/51/0258  . Supervision of high risk pregnancy, antepartum 05/06/2019  . Pre-existing type 1 diabetes mellitus in pregnancy in third trimester 05/06/2019  . Diabetic neuropathy ([place]) 01/16/2016  . GBS (group b Streptococcus) UTI complicating pregnancy, first trimester 04/08/2014  . Diabetic ketoacidosis without coma associated with type 1 diabetes mellitus ([place]) 09/19/2013  . Diabetes mellitus type 1, uncontrolled, with complications (  [place]) 10/13/1997    PLAN: CT showed edema no abscess. Continue present abx. Evaluate for [facility], start labetalol for [facility] which is worsening  Emeterio Reeve 09/30/2019,10:11 AM

## 2019-10-01 DIAGNOSIS — O99019 Anemia complicating pregnancy, unspecified trimester: Secondary | ICD-10-CM | POA: Diagnosis present

## 2019-10-01 LAB — CBC
HCT: 23 % — ABNORMAL LOW (ref 36.0–46.0)
Hemoglobin: 7.4 g/dL — ABNORMAL LOW (ref 12.0–15.0)
MCH: 28.4 pg (ref 26.0–34.0)
MCHC: 32.2 g/dL (ref 30.0–36.0)
MCV: 88.1 fL (ref 80.0–100.0)
Platelets: 251 10*3/uL (ref 150–400)
RBC: 2.61 MIL/uL — ABNORMAL LOW (ref 3.87–5.11)
RDW: 14.1 % (ref 11.5–15.5)
WBC: 8.6 10*3/uL (ref 4.0–10.5)
nRBC: 0 % (ref 0.0–0.2)

## 2019-10-01 LAB — PROTEIN / CREATININE RATIO, URINE
Creatinine, Urine: 109.85 mg/dL
Protein Creatinine Ratio: 3.87 mg/mg{Cre} — ABNORMAL HIGH (ref 0.00–0.15)
Total Protein, Urine: 425 mg/dL

## 2019-10-01 LAB — COMPREHENSIVE METABOLIC PANEL
ALT: 32 U/L (ref 0–44)
AST: 52 U/L — ABNORMAL HIGH (ref 15–41)
Albumin: 1.4 g/dL — ABNORMAL LOW (ref 3.5–5.0)
Alkaline Phosphatase: 61 U/L (ref 38–126)
Anion gap: 7 (ref 5–15)
BUN: 13 mg/dL (ref 6–20)
CO2: 18 mmol/L — ABNORMAL LOW (ref 22–32)
Calcium: 7.5 mg/dL — ABNORMAL LOW (ref 8.9–10.3)
Chloride: 112 mmol/L — ABNORMAL HIGH (ref 98–111)
Creatinine, Ser: 1.4 mg/dL — ABNORMAL HIGH (ref 0.44–1.00)
GFR calc Af Amer: 60 mL/min — ABNORMAL LOW (ref >60)
GFR calc non Af Amer: 51 mL/min — ABNORMAL LOW (ref >60)
Glucose, Bld: 147 mg/dL — ABNORMAL HIGH (ref 70–99)
Potassium: 3.6 mmol/L (ref 3.5–5.1)
Sodium: 137 mmol/L (ref 135–145)
Total Bilirubin: 0.2 mg/dL — ABNORMAL LOW (ref 0.3–1.2)
Total Protein: 4.8 g/dL — ABNORMAL LOW (ref 6.5–8.1)

## 2019-10-01 MED ORDER — SODIUM CHLORIDE 0.9 % IV SOLN
510.0000 mg | INTRAVENOUS | Status: DC
  Administered 2019-10-01: 510 mg via INTRAVENOUS
  Filled 2019-10-01: qty 17

## 2019-10-01 NOTE — Addendum Note (Signed)
Addended by: Dorita Sciara on: 10/01/2019 07:16 AM   Modules accepted: Orders

## 2019-10-01 NOTE — Addendum Note (Signed)
Addended by: Dorita Sciara on: 10/01/2019 07:19 AM   Modules accepted: Orders

## 2019-10-01 NOTE — Progress Notes (Signed)
Patient ID: Ashley Freeman, female   DOB: [birth year], 29 y.o.   MRN: [id number] [place]) NOTE  Ashley Freeman is a 29 y.o. G2P0010 at [redacted]w[redacted]d by best clinical estimate who is admitted for diabetes, right vulvar infection and  Hypertension and preeclampsia.   Fetal presentation is cephalic. Length of Stay:  4  Days  Subjective: Feels a bit better Patient reports the fetal movement as active. Patient reports uterine contraction  activity as none. Patient reports  vaginal bleeding as none. Patient describes fluid per vagina as None.  Vitals:  Blood pressure (!) 145/78, pulse (!) 104, temperature 98.4 F (36.9 C), temperature source Oral, resp. rate 18, height 5\' 3"  (1.6 m), weight 85.7 kg, SpO2 94 %. Physical Examination:  General appearance - alert, well appearing, and in no distress Heart - normal rate and regular rhythm Abdomen - soft, nontender, nondistended Fundal Height:  size equals dates Cervical Exam: Not evaluated. Right vulvar edema still noted Extremities: extremities normal, atraumatic, no cyanosis or edema and Homans sign is negative, no sign of DVT Membranes:intact  Fetal Monitoring:  Fetal Heart Rate A  Mode External filed at 10/01/2019 1015  Baseline Rate (A) 150 bpm filed at 10/01/2019 1015  Variability 6-25 BPM filed at 10/01/2019 1015  Accelerations 10 x 10, 15 x 15 filed at 10/01/2019 1015  Decelerations None filed at 10/01/2019 1015     Labs:  Results for orders placed or performed during the hospital encounter of 09/27/19 (from the past 24 hour(s))  Protein / creatinine ratio, urine   Collection Time: 10/01/19  5:05 AM  Result Value Ref Range   Creatinine, Urine 109.85 mg/dL   Total Protein, Urine 425 mg/dL   Protein Creatinine Ratio 3.87 (H) 0.00 - 0.15 mg/mg[Cre]  CBC   Collection Time: 10/01/19  5:40 AM  Result Value Ref Range   WBC 8.6 4.0 - 10.5 K/uL   RBC 2.61 (L) 3.87 - 5.11 MIL/uL    Hemoglobin 7.4 (L) 12.0 - 15.0 g/dL   HCT 23.0 (L) 36.0 - 46.0 %   MCV 88.1 80.0 - 100.0 fL   MCH 28.4 26.0 - 34.0 pg   MCHC 32.2 30.0 - 36.0 g/dL   RDW 14.1 11.5 - 15.5 %   Platelets 251 150 - 400 K/uL   nRBC 0.0 0.0 - 0.2 %  Comprehensive metabolic panel   Collection Time: 10/01/19  5:40 AM  Result Value Ref Range   Sodium 137 135 - 145 mmol/L   Potassium 3.6 3.5 - 5.1 mmol/L   Chloride 112 (H) 98 - 111 mmol/L   CO2 18 (L) 22 - 32 mmol/L   Glucose, Bld 147 (H) 70 - 99 mg/dL   BUN 13 6 - 20 mg/dL   Creatinine, Ser 1.40 (H) 0.44 - 1.00 mg/dL   Calcium 7.5 (L) 8.9 - 10.3 mg/dL   Total Protein 4.8 (L) 6.5 - 8.1 g/dL   Albumin 1.4 (L) 3.5 - 5.0 g/dL   AST 52 (H) 15 - 41 U/L   ALT 32 0 - 44 U/L   Alkaline Phosphatase 61 38 - 126 U/L   Total Bilirubin 0.2 (L) 0.3 - 1.2 mg/dL   GFR calc non Af Amer 51 (L) >60 mL/min   GFR calc Af Amer 60 (L) >60 mL/min   Anion gap 7 5 - 15     Medications:  Scheduled . aspirin EC  81 mg Oral Daily  . docusate sodium  100 mg Oral Daily  . insulin  pump   Subcutaneous TID PC,HS,0200  . labetalol  200 mg Oral BID  . metroNIDAZOLE  500 mg Oral Q12H  . pantoprazole  20 mg Oral Daily  . prenatal multivitamin  1 tablet Oral Q1200   I have reviewed the patient's current medications.  ASSESSMENT: Patient Active Problem List   Diagnosis Date Noted  . Vulvar cellulitis 09/27/2019  . Bilateral leg edema 09/27/2019  . Systolic ejection murmur 36/09/7701  . Shortness of breath 08/03/2019  . Type 1 diabetes mellitus with hyperglycemia ([place]) 07/24/2019  . Chronic hypertension during pregnancy, antepartum 07/14/2019  . Diabetic retinopathy ([place]) 07/02/2019  . 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 Proteinuria complicating pregnancy 40/35/2481  . Supervision of high risk pregnancy, antepartum 05/06/2019  . Pre-existing type 1 diabetes mellitus in pregnancy in third trimester 05/06/2019  . Diabetic neuropathy ([place]) 01/16/2016   . GBS (group b Streptococcus) UTI complicating pregnancy, first trimester 04/08/2014  . Diabetic ketoacidosis without coma associated with type 1 diabetes mellitus ([place]) 09/19/2013  . Diabetes mellitus type 1, uncontrolled, with complications ([place]) 85/90/9311    PLAN: Follow for s/sx of preeclampsia with severe features Iron infusion for anemia, consider transfusion Continue IV antibiotic for vulvar infection [facility] Ashley  Emeterio Freeman 10/01/2019,4:40 PM

## 2019-10-02 ENCOUNTER — Inpatient Hospital Stay (HOSPITAL_BASED_OUTPATIENT_CLINIC_OR_DEPARTMENT_OTHER): Payer: BLUE CROSS/BLUE SHIELD

## 2019-10-02 ENCOUNTER — Ambulatory Visit: Payer: BLUE CROSS/BLUE SHIELD | Admitting: Cardiology

## 2019-10-02 DIAGNOSIS — O10013 Pre-existing essential hypertension complicating pregnancy, third trimester: Secondary | ICD-10-CM

## 2019-10-02 DIAGNOSIS — O2693 Pregnancy related conditions, unspecified, third trimester: Secondary | ICD-10-CM

## 2019-10-02 DIAGNOSIS — Z3A31 31 weeks gestation of pregnancy: Secondary | ICD-10-CM

## 2019-10-02 DIAGNOSIS — O24313 Unspecified pre-existing diabetes mellitus in pregnancy, third trimester: Secondary | ICD-10-CM | POA: Diagnosis not present

## 2019-10-02 LAB — AEROBIC/ANAEROBIC CULTURE W GRAM STAIN (SURGICAL/DEEP WOUND)
Culture: NO GROWTH
Gram Stain: NONE SEEN

## 2019-10-02 LAB — GLUCOSE, CAPILLARY: Glucose-Capillary: 50 mg/dL — ABNORMAL LOW (ref 70–99)

## 2019-10-02 MED ORDER — PANTOPRAZOLE SODIUM 20 MG PO TBEC
20.0000 mg | DELAYED_RELEASE_TABLET | Freq: Two times a day (BID) | ORAL | Status: DC
  Administered 2019-10-03 – 2019-10-04 (×4): 20 mg via ORAL
  Filled 2019-10-02 (×5): qty 1

## 2019-10-02 NOTE — Progress Notes (Signed)
Patient ID: Ashley Freeman, female   DOB: [birth year], 29 y.o.   MRN: [id number] [place]) NOTE  Ashley Freeman is a 29 y.o. G2P0010 at [redacted]w[redacted]d by best clinical estimate who is admitted for vulvar infection and CHTN.   Fetal presentation is cephalic. Length of Stay:  5  Days  Subjective: Less pain Patient reports the fetal movement as active. Patient reports uterine contraction  activity as none. Patient reports  vaginal bleeding as none. Patient describes fluid per vagina as None.  Vitals:  Blood pressure (!) 157/80, pulse (!) 105, temperature 97.9 F (36.6 C), temperature source Oral, resp. rate 17, height 5\' 3"  (1.6 m), weight 84.4 kg, SpO2 95 %. Physical Examination:  General appearance - alert, well appearing, and in no distress Heart - normal rate and regular rhythm Abdomen - soft, nontender, nondistended Fundal Height:  size equals dates Cervical Exam: Not evaluated. Right vulvar edema is stable, less tender Extremities: extremities normal, atraumatic, no cyanosis or edema and Homans sign is negative, no sign of DVT Membranes:intact  Fetal Monitoring:   Fetal Heart Rate A  Mode External  [removed] filed at 10/02/2019 0008  Baseline Rate (A) 135 bpm filed at 10/02/2019 0008  Variability 6-25 BPM filed at 10/02/2019 0008  Accelerations 15 x 15 filed at 10/02/2019 0008  Decelerations None filed at 10/02/2019 0008     Labs:  No results found for this or any previous visit (from the past 24 hour(s)).   Medications:  Scheduled . aspirin EC  81 mg Oral Daily  . docusate sodium  100 mg Oral Daily  . insulin pump   Subcutaneous TID PC,HS,0200  . labetalol  200 mg Oral BID  . metroNIDAZOLE  500 mg Oral Q12H  . pantoprazole  20 mg Oral Daily  . prenatal multivitamin  1 tablet Oral Q1200  CBG (last 3)    I have reviewed the patient's current medications.  ASSESSMENT: Patient Active Problem List   Diagnosis Date Noted    . Anemia of pregnancy 10/01/2019  . Vulvar cellulitis 09/27/2019  . Bilateral leg edema 09/27/2019  . Systolic ejection murmur 17/51/0258  . Shortness of breath 08/03/2019  . Type 1 diabetes mellitus with hyperglycemia ([place]) 07/24/2019  . Chronic hypertension during pregnancy, antepartum 07/14/2019  . Diabetic retinopathy ([place]) 07/02/2019  . Proteinuria due to type 1 diabetes mellitus ([place]) 05/21/2019  . Proteinuria complicating pregnancy 52/77/8242  . Supervision of high risk pregnancy, antepartum 05/06/2019  . Pre-existing type 1 diabetes mellitus in pregnancy in third trimester 05/06/2019  . Diabetic neuropathy ([place]) 01/16/2016  . GBS (group b Streptococcus) UTI complicating pregnancy, first trimester 04/08/2014  . Diabetic ketoacidosis without coma associated with type 1 diabetes mellitus ([place]) 09/19/2013  . Diabetes mellitus type 1, uncontrolled, with complications ([place]) 35/36/1443    PLAN: Continue antibiotic. [place] images reviewed and nl AFI and appropriate growth noted  Emeterio Reeve 10/02/2019,11:40 AM

## 2019-10-03 DIAGNOSIS — O1213 Gestational proteinuria, third trimester: Secondary | ICD-10-CM

## 2019-10-03 DIAGNOSIS — E1069 Type 1 diabetes mellitus with other specified complication: Secondary | ICD-10-CM

## 2019-10-03 DIAGNOSIS — O99013 Anemia complicating pregnancy, third trimester: Secondary | ICD-10-CM

## 2019-10-03 DIAGNOSIS — O10013 Pre-existing essential hypertension complicating pregnancy, third trimester: Secondary | ICD-10-CM

## 2019-10-03 DIAGNOSIS — O1413 Severe pre-eclampsia, third trimester: Secondary | ICD-10-CM

## 2019-10-03 DIAGNOSIS — D649 Anemia, unspecified: Secondary | ICD-10-CM

## 2019-10-03 DIAGNOSIS — Z3A32 32 weeks gestation of pregnancy: Secondary | ICD-10-CM

## 2019-10-03 LAB — COMPREHENSIVE METABOLIC PANEL
ALT: 45 U/L — ABNORMAL HIGH (ref 0–44)
AST: 81 U/L — ABNORMAL HIGH (ref 15–41)
Albumin: 1.4 g/dL — ABNORMAL LOW (ref 3.5–5.0)
Alkaline Phosphatase: 68 U/L (ref 38–126)
Anion gap: 10 (ref 5–15)
BUN: 6 mg/dL (ref 6–20)
CO2: 17 mmol/L — ABNORMAL LOW (ref 22–32)
Calcium: 7.9 mg/dL — ABNORMAL LOW (ref 8.9–10.3)
Chloride: 110 mmol/L (ref 98–111)
Creatinine, Ser: 1.27 mg/dL — ABNORMAL HIGH (ref 0.44–1.00)
GFR calc Af Amer: >60 mL/min (ref >60)
GFR calc non Af Amer: 58 mL/min — ABNORMAL LOW (ref >60)
Glucose, Bld: 79 mg/dL (ref 70–99)
Potassium: 3.9 mmol/L (ref 3.5–5.1)
Sodium: 137 mmol/L (ref 135–145)
Total Bilirubin: 0.5 mg/dL (ref 0.3–1.2)
Total Protein: 4.7 g/dL — ABNORMAL LOW (ref 6.5–8.1)

## 2019-10-03 LAB — GLUCOSE, CAPILLARY: Glucose-Capillary: 45 mg/dL — ABNORMAL LOW (ref 70–99)

## 2019-10-03 LAB — CBC
HCT: 24.5 % — ABNORMAL LOW (ref 36.0–46.0)
Hemoglobin: 8.1 g/dL — ABNORMAL LOW (ref 12.0–15.0)
MCH: 29 pg (ref 26.0–34.0)
MCHC: 33.1 g/dL (ref 30.0–36.0)
MCV: 87.8 fL (ref 80.0–100.0)
Platelets: 265 10*3/uL (ref 150–400)
RBC: 2.79 MIL/uL — ABNORMAL LOW (ref 3.87–5.11)
RDW: 14.2 % (ref 11.5–15.5)
WBC: 9.1 10*3/uL (ref 4.0–10.5)
nRBC: 0 % (ref 0.0–0.2)

## 2019-10-03 MED ORDER — INSULIN REGULAR(HUMAN) IN NACL 100-0.9 UT/100ML-% IV SOLN
INTRAVENOUS | Status: DC
  Administered 2019-10-03: 1.4 [IU]/h via INTRAVENOUS
  Administered 2019-10-05: 0.8 [IU]/h via INTRAVENOUS
  Administered 2019-10-05: 0.4 [IU]/h via INTRAVENOUS
  Filled 2019-10-03 (×2): qty 100

## 2019-10-03 MED ORDER — SODIUM CHLORIDE 0.9 % IV SOLN: INTRAVENOUS | Status: DC

## 2019-10-03 MED ORDER — DEXTROSE 50 % IV SOLN
INTRAVENOUS | Status: AC
  Filled 2019-10-03: qty 50

## 2019-10-03 MED ORDER — DEXTROSE 50 % IV SOLN
0.0000 mL | INTRAVENOUS | Status: DC | PRN
  Administered 2019-10-03 (×2): 35 mL via INTRAVENOUS
  Filled 2019-10-03 (×2): qty 50

## 2019-10-03 MED ORDER — BETAMETHASONE SOD PHOS & ACET 6 (3-3) MG/ML IJ SUSP
12.0000 mg | INTRAMUSCULAR | Status: AC
  Administered 2019-10-03 – 2019-10-04 (×2): 12 mg via INTRAMUSCULAR
  Filled 2019-10-03: qty 5

## 2019-10-03 MED ORDER — DEXTROSE-NACL 5-0.45 % IV SOLN: INTRAVENOUS | Status: DC

## 2019-10-03 NOTE — Progress Notes (Signed)
Pt. Removed insulin pod from left arm.

## 2019-10-03 NOTE — Progress Notes (Signed)
Patient states her CBG was dropping.  CBG was 68.  Apple juice given x2, [place] sandwich given and peanut butter.  Patient only ate few bites of chicken noodle soup for breakfast.  See recorded results.

## 2019-10-03 NOTE — Progress Notes (Signed)
Patient ID: Ashley Freeman, female   DOB: December 14, 1990, 29 y.o.   MRN: [id number] [place]) NOTE  Ashley Freeman is a 29 y.o. G2P0010 at [redacted]w[redacted]d by best clinical estimate who is admitted for superimposed pre-eclampsia on CHTN with severe features.   Fetal presentation is cephalic. 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 Has nephrotic range proteinuria and significant LE edema and likely vulvar edema related to that--will stop IV Abx. DM on CGM and insulin pump--ok for now--since giving BMZ--will begin insulin gtt Acidemia - CO2 is 17 and has fluctuated from 16-20 the entire admission--unclear etiology, will bolus 500 cc. Continue IVF at rate of 50 cc/hour given edema. Anemia - s/p Feraheme Hgb up to 8.1 Blood pressure is stable on Labetalol 200 mg BID, one at 160/86 yesterday am -->would not consider her a candidate for home given need for escalation of BP meds and would consider delivery at 34 weeks. Plts are stable at 265 LFTs are in the 80 and 40 range Acute on chronic renal insufficiency - worsened by CT with contrast Baseline Cr in early pregnancy is 0.83, but 1.09 earlier this admission Reviewed care with MFM who recommended course of BMZ, daily labs, deliver after 48 hours if LFTs remain elevated.  Subjective: Feels ok. Patient reports the fetal movement as active. Patient reports uterine contraction  activity as none. Patient reports  vaginal bleeding as none. Patient describes fluid per vagina as None.  Vitals:  Blood  pressure (!) 141/87, pulse (!) 103, temperature 98 F (36.7 C), temperature source Oral, resp. rate 18, height 5\' 3"  (1.6 m), weight 85.3 kg, SpO2 96 %. Physical Examination:  General appearance - alert, well appearing, and in no distress Chest - normal effort Abdomen - gravid, non-tender Fundal Height:  size equals dates Extremities: edema 3+   Membranes:intact  Fetal Monitoring:  Baseline: 135 bpm, Variability: Good {> 6 bpm), Accelerations: Reactive and Decelerations: Absent  Labs:  Results for orders placed or performed during the hospital encounter of 09/27/19 (from the past 24 hour(s))  Glucose, capillary   Collection Time: 10/02/19 10:47 PM  Result Value Ref Range   Glucose-Capillary 50 (L) 70 - 99 mg/dL  Comprehensive metabolic panel   Collection Time: 10/03/19  9:28 AM  Result Value Ref Range   Sodium 137 135 - 145 mmol/L   Potassium 3.9 3.5 - 5.1 mmol/L   Chloride 110 98 - 111 mmol/L   CO2 17 (L) 22 - 32 mmol/L   Glucose, Bld 79 70 - 99 mg/dL   BUN 6 6 - 20 mg/dL   Creatinine, Ser 1.27 (H) 0.44 - 1.00 mg/dL   Calcium 7.9 (L) 8.9 - 10.3 mg/dL   Total Protein 4.7 (L) 6.5 - 8.1 g/dL   Albumin 1.4 (L) 3.5 - 5.0 g/dL   AST 81 (H) 15 - 41 U/L   ALT 45 (H) 0 - 44 U/L   Alkaline Phosphatase 68 38 - 126 U/L   Total Bilirubin 0.5 0.3 - 1.2 mg/dL   GFR calc non Af Amer 58 (L) >60 mL/min   GFR calc Af Amer >  60 >60 mL/min   Anion gap 10 5 - 15  CBC   Collection Time: 10/03/19  9:28 AM  Result Value Ref Range   WBC 9.1 4.0 - 10.5 K/uL   RBC 2.79 (L) 3.87 - 5.11 MIL/uL   Hemoglobin 8.1 (L) 12.0 - 15.0 g/dL   HCT 24.5 (L) 36.0 - 46.0 %   MCV 87.8 80.0 - 100.0 fL   MCH 29.0 26.0 - 34.0 pg   MCHC 33.1 30.0 - 36.0 g/dL   RDW 14.2 11.5 - 15.5 %   Platelets 265 150 - 400 K/uL   nRBC 0.0 0.0 - 0.2 %    Medications:  Scheduled  aspirin EC  81 mg Oral Daily   docusate sodium  100 mg Oral Daily   insulin pump   Subcutaneous TID PC,HS,0200   labetalol  200 mg Oral BID    metroNIDAZOLE  500 mg Oral Q12H   pantoprazole  20 mg Oral BID   prenatal multivitamin  1 tablet Oral Q1200   I have reviewed the patient's current medications.   Donnamae Jude, MD 10/03/2019,11:28 AM

## 2019-10-03 NOTE — Progress Notes (Addendum)
CRITICAL VALUE ALERT  Critical Value: CBG- 65  Date & Time Notied:  6/4 2230  Provider Notified: Dr. Rip Harbour  Orders Received/Actions taken: "give snack and recheck"

## 2019-10-03 NOTE — Progress Notes (Addendum)
Pt blood glucose at bedtime at 2220  is 65, pt given a cup of apple juice and 1/2 of applesauce. Pt states she had very light to nothing dinner. At 2247 pt blood glucose is 50, pt given sprite, soup, and crackers. Pt reports acid reflux. Provider notified at 2250, and ordered protonix. At 2325, rechecked pt blood glucose, it was 90. Pt also stated acid reflux resolved. Will continue to monitor pt closely.

## 2019-10-03 NOTE — ED Provider Notes (Signed)
CRITICAL VALUE ALERT  Critical Value: CBG- 65  Date & Time Notied:  6/4 2230  Provider Notified: Dr. Rip Harbour  Orders Received/Actions taken: "give snack and recheck"

## 2019-10-04 LAB — COMPREHENSIVE METABOLIC PANEL
ALT: 48 U/L — ABNORMAL HIGH (ref 0–44)
AST: 75 U/L — ABNORMAL HIGH (ref 15–41)
Albumin: 1.5 g/dL — ABNORMAL LOW (ref 3.5–5.0)
Alkaline Phosphatase: 70 U/L (ref 38–126)
Anion gap: 10 (ref 5–15)
BUN: 11 mg/dL (ref 6–20)
CO2: 16 mmol/L — ABNORMAL LOW (ref 22–32)
Calcium: 8 mg/dL — ABNORMAL LOW (ref 8.9–10.3)
Chloride: 110 mmol/L (ref 98–111)
Creatinine, Ser: 1.34 mg/dL — ABNORMAL HIGH (ref 0.44–1.00)
GFR calc Af Amer: >60 mL/min (ref >60)
GFR calc non Af Amer: 54 mL/min — ABNORMAL LOW (ref >60)
Glucose, Bld: 90 mg/dL (ref 70–99)
Potassium: 4 mmol/L (ref 3.5–5.1)
Sodium: 136 mmol/L (ref 135–145)
Total Bilirubin: 0.4 mg/dL (ref 0.3–1.2)
Total Protein: 4.7 g/dL — ABNORMAL LOW (ref 6.5–8.1)

## 2019-10-04 LAB — CBC
HCT: 24.1 % — ABNORMAL LOW (ref 36.0–46.0)
Hemoglobin: 7.8 g/dL — ABNORMAL LOW (ref 12.0–15.0)
MCH: 28.5 pg (ref 26.0–34.0)
MCHC: 32.4 g/dL (ref 30.0–36.0)
MCV: 88 fL (ref 80.0–100.0)
Platelets: 301 10*3/uL (ref 150–400)
RBC: 2.74 MIL/uL — ABNORMAL LOW (ref 3.87–5.11)
RDW: 14.5 % (ref 11.5–15.5)
WBC: 15.4 10*3/uL — ABNORMAL HIGH (ref 4.0–10.5)
nRBC: 0.3 % — ABNORMAL HIGH (ref 0.0–0.2)

## 2019-10-04 LAB — GLUCOSE, CAPILLARY
Glucose-Capillary: 117 mg/dL — ABNORMAL HIGH (ref 70–99)
Glucose-Capillary: 124 mg/dL — ABNORMAL HIGH (ref 70–99)

## 2019-10-04 NOTE — Progress Notes (Signed)
Patient ID: Ashley Freeman, female   DOB: 08-12-1990, 29 y.o.   MRN: [id number] Ashley Freeman is a 29 y.o. G2P0010 at [redacted]w[redacted]d by best clinical estimate who is admitted for superimposed preeclampsia on chronic hypertension with severe features.   Fetal presentation is cephalic. 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 Now on insulin GTT Anemia stable hemoglobin is 7.8 Blood pressure stable on labetalol 200 mg twice daily, platelet count today is 3.1, creatinine has bumped from 1.27-1.34 today LFTs are 48 and 75 and stable. Acidemia CO2 of 16 today it is unclear at this is coming from. She is status post fluid bolus yesterday Care was reviewed with MFM yesterday who recommended a course of betamethasone followed by delivery.  She will be 48 hours on tomorrow.  We will likely move her up for beginning of induction at midnight.   We will have NICU come by and see her.    Subjective: Feels ok, still with swelling Patient reports the fetal movement as active. Patient reports uterine contraction  activity as none. Patient reports  vaginal bleeding as none. Patient describes fluid per vagina as None.  Vitals:  Blood pressure (!) 145/78, pulse (!) 104, temperature 97.9 F (36.6 C), temperature source Oral, resp. rate 20, height 5\' 3"  (1.6 m), weight 87 kg, SpO2 100 %. Physical Examination:  General appearance - alert, well appearing, and in no distress Chest - normal effort Abdomen - gravid,  non-tender Fundal Height:  size equals dates Extremities: edema 3+  Membranes:intact  Fetal Monitoring:  Baseline: 135 bpm, Variability: Good {> 6 bpm), Accelerations: Reactive and Decelerations: Absent  Labs:  Results for orders placed or performed during the hospital encounter of 09/27/19 (from the past 24 hour(s))  Comprehensive metabolic panel   Collection Time: 10/03/19  9:28 AM  Result Value Ref Range   Sodium 137 135 - 145 mmol/L   Potassium 3.9 3.5 - 5.1 mmol/L   Chloride 110 98 - 111 mmol/L   CO2 17 (L) 22 - 32 mmol/L   Glucose, Bld 79 70 - 99 mg/dL   BUN 6 6 - 20 mg/dL   Creatinine, Ser 1.27 (H) 0.44 - 1.00 mg/dL   Calcium 7.9 (L) 8.9 - 10.3 mg/dL   Total Protein 4.7 (L) 6.5 - 8.1 g/dL   Albumin 1.4 (L) 3.5 - 5.0 g/dL   AST 81 (H) 15 - 41 U/L   ALT 45 (H) 0 - 44 U/L   Alkaline Phosphatase 68 38 - 126 U/L   Total Bilirubin 0.5 0.3 - 1.2 mg/dL   GFR calc non Af Amer 58 (L) >60 mL/min   GFR calc Af Amer >60 >60 mL/min   Anion gap 10 5 - 15  CBC   Collection Time: 10/03/19  9:28 AM  Result Value Ref Range   WBC 9.1 4.0 - 10.5 K/uL   RBC 2.79 (L) 3.87 - 5.11 MIL/uL   Hemoglobin 8.1 (L) 12.0 - 15.0 g/dL   HCT 24.5 (L) 36.0 - 46.0 %   MCV 87.8 80.0 - 100.0 fL   MCH 29.0  26.0 - 34.0 pg   MCHC 33.1 30.0 - 36.0 g/dL   RDW 14.2 11.5 - 15.5 %   Platelets 265 150 - 400 K/uL   nRBC 0.0 0.0 - 0.2 %  Glucose, capillary   Collection Time: 10/03/19 10:28 AM  Result Value Ref Range   Glucose-Capillary 45 (L) 70 - 99 mg/dL  Comprehensive metabolic panel   Collection Time: 10/04/19  5:27 AM  Result Value Ref Range   Sodium 136 135 - 145 mmol/L   Potassium 4.0 3.5 - 5.1 mmol/L   Chloride 110 98 - 111 mmol/L   CO2 16 (L) 22 - 32 mmol/L   Glucose, Bld 90 70 - 99 mg/dL   BUN 11 6 - 20 mg/dL   Creatinine, Ser 1.34 (H) 0.44 - 1.00 mg/dL   Calcium 8.0 (L) 8.9 - 10.3 mg/dL   Total Protein 4.7 (L) 6.5 - 8.1 g/dL   Albumin 1.5 (L) 3.5 - 5.0 g/dL   AST 75 (H) 15 - 41 U/L   ALT  48 (H) 0 - 44 U/L   Alkaline Phosphatase 70 38 - 126 U/L   Total Bilirubin 0.4 0.3 - 1.2 mg/dL   GFR calc non Af Amer 54 (L) >60 mL/min   GFR calc Af Amer >60 >60 mL/min   Anion gap 10 5 - 15  CBC   Collection Time: 10/04/19  5:27 AM  Result Value Ref Range   WBC 15.4 (H) 4.0 - 10.5 K/uL   RBC 2.74 (L) 3.87 - 5.11 MIL/uL   Hemoglobin 7.8 (L) 12.0 - 15.0 g/dL   HCT 24.1 (L) 36.0 - 46.0 %   MCV 88.0 80.0 - 100.0 fL   MCH 28.5 26.0 - 34.0 pg   MCHC 32.4 30.0 - 36.0 g/dL   RDW 14.5 11.5 - 15.5 %   Platelets 301 150 - 400 K/uL   nRBC 0.3 (H) 0.0 - 0.2 %    Medications:  Scheduled . aspirin EC  81 mg Oral Daily  . betamethasone acetate-betamethasone sodium phosphate  12 mg Intramuscular Q24 Hr x 2  . docusate sodium  100 mg Oral Daily  . labetalol  200 mg Oral BID  . pantoprazole  20 mg Oral BID  . prenatal multivitamin  1 tablet Oral Q1200   I have reviewed the patient's current medications.   Ashley Jude, MD 10/04/2019,7:46 AM

## 2019-10-04 NOTE — Consult Note (Signed)
Asked by Dr Kennon Rounds to consult on Ashley Freeman for expected preterm delivery at 32 weeks due to maternal complications associated with uncontrolled type 1 DM and chronic hypertension with superimposed preeclampsia. She received her second dose of betamethasone today. Other complications noted during this pregnancy are: vulvar cellulitis and GBS UTI.  She is planned for IOL on 6/7.  I spoke to her in her room with her mother present. I discussed Neonatology presence at delivery. I talked about providing resuscitation to her baby based on infant's needs. I discussed the most common complications of prematurity at this gestation: RDS and various resp support, nutrition by HAL and gavage feedings until the baby is able to po, and learning to po feed. I talked about estimated LOS.  We discussed providing breast milk and its benefits to the baby. She is planning to provide breast milk.  I answered her questions fully.   Thank you for inviting [place] to participate with Ashley Freeman's care.  Tommie Sams MD Neonatologist   The total length of face-to-face or floor / unit time for this encounter was 25 minutes.  Counseling and / or coordination of care was greater than fifty percent of the time.

## 2019-10-05 ENCOUNTER — Ambulatory Visit: Payer: BLUE CROSS/BLUE SHIELD | Admitting: Cardiology

## 2019-10-05 ENCOUNTER — Other Ambulatory Visit: Payer: Self-pay

## 2019-10-05 ENCOUNTER — Inpatient Hospital Stay (HOSPITAL_COMMUNITY): Payer: BLUE CROSS/BLUE SHIELD | Admitting: Certified Registered Nurse Anesthetist

## 2019-10-05 ENCOUNTER — Inpatient Hospital Stay (HOSPITAL_COMMUNITY): Payer: BLUE CROSS/BLUE SHIELD

## 2019-10-05 ENCOUNTER — Encounter (HOSPITAL_COMMUNITY): Payer: Self-pay | Admitting: Obstetrics and Gynecology

## 2019-10-05 ENCOUNTER — Inpatient Hospital Stay (HOSPITAL_COMMUNITY)
Admission: AD | Admit: 2019-10-05 | Payer: BLUE CROSS/BLUE SHIELD | Source: Home / Self Care | Admitting: Obstetrics and Gynecology

## 2019-10-05 ENCOUNTER — Encounter (HOSPITAL_COMMUNITY)
Admission: AD | Disposition: A | Discharge status: Home / Self Care | Payer: Self-pay | Source: Home / Self Care | Attending: Obstetrics and Gynecology

## 2019-10-05 ENCOUNTER — Ambulatory Visit: Payer: BLUE CROSS/BLUE SHIELD

## 2019-10-05 DIAGNOSIS — Z8759 Personal history of other complications of pregnancy, childbirth and the puerperium: Secondary | ICD-10-CM | POA: Diagnosis present

## 2019-10-05 DIAGNOSIS — R7401 Elevation of levels of liver transaminase levels: Secondary | ICD-10-CM | POA: Diagnosis not present

## 2019-10-05 DIAGNOSIS — O1414 Severe pre-eclampsia complicating childbirth: Secondary | ICD-10-CM

## 2019-10-05 DIAGNOSIS — R7989 Other specified abnormal findings of blood chemistry: Secondary | ICD-10-CM | POA: Diagnosis not present

## 2019-10-05 DIAGNOSIS — Z3A32 32 weeks gestation of pregnancy: Secondary | ICD-10-CM

## 2019-10-05 DIAGNOSIS — O114 Pre-existing hypertension with pre-eclampsia, complicating childbirth: Secondary | ICD-10-CM

## 2019-10-05 DIAGNOSIS — O1424 HELLP syndrome, complicating childbirth: Secondary | ICD-10-CM

## 2019-10-05 DIAGNOSIS — O24013 Pre-existing diabetes mellitus, type 1, in pregnancy, third trimester: Secondary | ICD-10-CM

## 2019-10-05 LAB — COMPREHENSIVE METABOLIC PANEL
ALT: 47 U/L — ABNORMAL HIGH (ref 0–44)
ALT: 49 U/L — ABNORMAL HIGH (ref 0–44)
ALT: 50 U/L — ABNORMAL HIGH (ref 0–44)
AST: 72 U/L — ABNORMAL HIGH (ref 15–41)
AST: 73 U/L — ABNORMAL HIGH (ref 15–41)
AST: 70 U/L — ABNORMAL HIGH (ref 15–41)
Albumin: 1.6 g/dL — ABNORMAL LOW (ref 3.5–5.0)
Albumin: 1.6 g/dL — ABNORMAL LOW (ref 3.5–5.0)
Albumin: 1.6 g/dL — ABNORMAL LOW (ref 3.5–5.0)
Alkaline Phosphatase: 67 U/L (ref 38–126)
Alkaline Phosphatase: 69 U/L (ref 38–126)
Alkaline Phosphatase: 71 U/L (ref 38–126)
Anion gap: 6 (ref 5–15)
Anion gap: 7 (ref 5–15)
Anion gap: 8 (ref 5–15)
BUN: 12 mg/dL (ref 6–20)
BUN: 12 mg/dL (ref 6–20)
BUN: 12 mg/dL (ref 6–20)
CO2: 16 mmol/L — ABNORMAL LOW (ref 22–32)
CO2: 16 mmol/L — ABNORMAL LOW (ref 22–32)
CO2: 17 mmol/L — ABNORMAL LOW (ref 22–32)
Calcium: 8 mg/dL — ABNORMAL LOW (ref 8.9–10.3)
Calcium: 7.9 mg/dL — ABNORMAL LOW (ref 8.9–10.3)
Calcium: 7.9 mg/dL — ABNORMAL LOW (ref 8.9–10.3)
Chloride: 113 mmol/L — ABNORMAL HIGH (ref 98–111)
Chloride: 111 mmol/L (ref 98–111)
Chloride: 109 mmol/L (ref 98–111)
Creatinine, Ser: 1.28 mg/dL — ABNORMAL HIGH (ref 0.44–1.00)
Creatinine, Ser: 1.24 mg/dL — ABNORMAL HIGH (ref 0.44–1.00)
Creatinine, Ser: 1.24 mg/dL — ABNORMAL HIGH (ref 0.44–1.00)
GFR calc Af Amer: >60 mL/min (ref >60)
GFR calc Af Amer: >60 mL/min (ref >60)
GFR calc Af Amer: >60 mL/min (ref >60)
GFR calc non Af Amer: 57 mL/min — ABNORMAL LOW (ref >60)
GFR calc non Af Amer: 59 mL/min — ABNORMAL LOW (ref >60)
GFR calc non Af Amer: 59 mL/min — ABNORMAL LOW (ref >60)
Glucose, Bld: 79 mg/dL (ref 70–99)
Glucose, Bld: 121 mg/dL — ABNORMAL HIGH (ref 70–99)
Glucose, Bld: 80 mg/dL (ref 70–99)
Potassium: 4.5 mmol/L (ref 3.5–5.1)
Potassium: 4.1 mmol/L (ref 3.5–5.1)
Potassium: 3.9 mmol/L (ref 3.5–5.1)
Sodium: 135 mmol/L (ref 135–145)
Sodium: 134 mmol/L — ABNORMAL LOW (ref 135–145)
Sodium: 134 mmol/L — ABNORMAL LOW (ref 135–145)
Total Bilirubin: 0.5 mg/dL (ref 0.3–1.2)
Total Bilirubin: 0.1 mg/dL — ABNORMAL LOW (ref 0.3–1.2)
Total Bilirubin: 0.2 mg/dL — ABNORMAL LOW (ref 0.3–1.2)
Total Protein: 4.6 g/dL — ABNORMAL LOW (ref 6.5–8.1)
Total Protein: 4.8 g/dL — ABNORMAL LOW (ref 6.5–8.1)
Total Protein: 4.8 g/dL — ABNORMAL LOW (ref 6.5–8.1)

## 2019-10-05 LAB — CBC
HCT: 25.4 % — ABNORMAL LOW (ref 36.0–46.0)
HCT: 25.2 % — ABNORMAL LOW (ref 36.0–46.0)
HCT: 26 % — ABNORMAL LOW (ref 36.0–46.0)
Hemoglobin: 8.1 g/dL — ABNORMAL LOW (ref 12.0–15.0)
Hemoglobin: 8.3 g/dL — ABNORMAL LOW (ref 12.0–15.0)
Hemoglobin: 8.4 g/dL — ABNORMAL LOW (ref 12.0–15.0)
MCH: 28.6 pg (ref 26.0–34.0)
MCH: 29.3 pg (ref 26.0–34.0)
MCH: 29.2 pg (ref 26.0–34.0)
MCHC: 31.9 g/dL (ref 30.0–36.0)
MCHC: 32.9 g/dL (ref 30.0–36.0)
MCHC: 32.3 g/dL (ref 30.0–36.0)
MCV: 89.8 fL (ref 80.0–100.0)
MCV: 89 fL (ref 80.0–100.0)
MCV: 90.3 fL (ref 80.0–100.0)
Platelets: 319 10*3/uL (ref 150–400)
Platelets: 326 10*3/uL (ref 150–400)
Platelets: 332 10*3/uL (ref 150–400)
RBC: 2.83 MIL/uL — ABNORMAL LOW (ref 3.87–5.11)
RBC: 2.83 MIL/uL — ABNORMAL LOW (ref 3.87–5.11)
RBC: 2.88 MIL/uL — ABNORMAL LOW (ref 3.87–5.11)
RDW: 14.3 % (ref 11.5–15.5)
RDW: 14.5 % (ref 11.5–15.5)
RDW: 14.6 % (ref 11.5–15.5)
WBC: 17.2 10*3/uL — ABNORMAL HIGH (ref 4.0–10.5)
WBC: 17.9 10*3/uL — ABNORMAL HIGH (ref 4.0–10.5)
WBC: 15.7 10*3/uL — ABNORMAL HIGH (ref 4.0–10.5)
nRBC: 0.2 % (ref 0.0–0.2)
nRBC: 0.1 % (ref 0.0–0.2)
nRBC: 0 % (ref 0.0–0.2)

## 2019-10-05 LAB — MAGNESIUM
Magnesium: 3.3 mg/dL — ABNORMAL HIGH (ref 1.7–2.4)
Magnesium: 5.6 mg/dL — ABNORMAL HIGH (ref 1.7–2.4)

## 2019-10-05 LAB — PROTIME-INR
INR: 1.2 (ref 0.8–1.2)
Prothrombin Time: 14.6 seconds (ref 11.4–15.2)

## 2019-10-05 LAB — APTT: aPTT: 25 seconds (ref 24–36)

## 2019-10-05 LAB — FIBRINOGEN: Fibrinogen: 629 mg/dL — ABNORMAL HIGH (ref 210–475)

## 2019-10-05 LAB — PREPARE RBC (CROSSMATCH)

## 2019-10-05 LAB — RPR: RPR Ser Ql: NONREACTIVE

## 2019-10-05 SURGERY — Surgical Case
Anesthesia: Spinal

## 2019-10-05 MED ORDER — ONDANSETRON HCL 4 MG/2ML IJ SOLN
4.0000 mg | Freq: Four times a day (QID) | INTRAMUSCULAR | Status: DC | PRN
  Administered 2019-10-05: 4 mg via INTRAVENOUS
  Filled 2019-10-05: qty 2

## 2019-10-05 MED ORDER — OXYTOCIN-SODIUM CHLORIDE 30-0.9 UT/500ML-% IV SOLN
INTRAVENOUS | Status: AC
  Filled 2019-10-05: qty 500

## 2019-10-05 MED ORDER — PROMETHAZINE HCL 25 MG/ML IJ SOLN
INTRAMUSCULAR | Status: DC | PRN
  Administered 2019-10-05: 6.25 mg via INTRAVENOUS

## 2019-10-05 MED ORDER — MISOPROSTOL 50MCG HALF TABLET
ORAL_TABLET | ORAL | Status: AC
  Filled 2019-10-05: qty 1

## 2019-10-05 MED ORDER — CEFAZOLIN SODIUM-DEXTROSE 1-4 GM/50ML-% IV SOLN
1.0000 g | Freq: Three times a day (TID) | INTRAVENOUS | Status: DC
  Administered 2019-10-05: 1 g via INTRAVENOUS
  Filled 2019-10-05 (×3): qty 50

## 2019-10-05 MED ORDER — MAGNESIUM SULFATE BOLUS VIA INFUSION
4.0000 g | Freq: Once | INTRAVENOUS | Status: AC
  Administered 2019-10-05: 4 g via INTRAVENOUS
  Filled 2019-10-05: qty 1000

## 2019-10-05 MED ORDER — OXYCODONE-ACETAMINOPHEN 5-325 MG PO TABS: 1.0000 | ORAL_TABLET | ORAL | Status: DC | PRN

## 2019-10-05 MED ORDER — TERBUTALINE SULFATE 1 MG/ML IJ SOLN: 0.2500 mg | Freq: Once | INTRAMUSCULAR | Status: DC | PRN

## 2019-10-05 MED ORDER — LACTATED RINGERS IV SOLN: INTRAVENOUS | Status: DC

## 2019-10-05 MED ORDER — MAGNESIUM SULFATE 40 GM/1000ML IV SOLN
2.0000 g/h | INTRAVENOUS | Status: DC
  Administered 2019-10-05: 1 g/h via INTRAVENOUS
  Administered 2019-10-06: 2 g/h via INTRAVENOUS
  Filled 2019-10-05 (×2): qty 1000

## 2019-10-05 MED ORDER — LABETALOL HCL 5 MG/ML IV SOLN: 40.0000 mg | INTRAVENOUS | Status: DC | PRN

## 2019-10-05 MED ORDER — OXYTOCIN-SODIUM CHLORIDE 30-0.9 UT/500ML-% IV SOLN: INTRAVENOUS | Status: DC | PRN

## 2019-10-05 MED ORDER — FENTANYL CITRATE (PF) 100 MCG/2ML IJ SOLN
INTRAMUSCULAR | Status: AC
  Filled 2019-10-05: qty 2

## 2019-10-05 MED ORDER — SOD CITRATE-CITRIC ACID 500-334 MG/5ML PO SOLN
30.0000 mL | ORAL | Status: DC | PRN
  Administered 2019-10-05: 30 mL via ORAL
  Filled 2019-10-05: qty 30

## 2019-10-05 MED ORDER — OXYTOCIN-SODIUM CHLORIDE 30-0.9 UT/500ML-% IV SOLN
2.5000 [IU]/h | INTRAVENOUS | Status: DC
  Administered 2019-10-06: 2.5 [IU]/h via INTRAVENOUS

## 2019-10-05 MED ORDER — ALBUTEROL SULFATE (2.5 MG/3ML) 0.083% IN NEBU
2.5000 mg | INHALATION_SOLUTION | Freq: Once | RESPIRATORY_TRACT | Status: AC
  Administered 2019-10-05: 2.5 mg via RESPIRATORY_TRACT
  Filled 2019-10-05: qty 3

## 2019-10-05 MED ORDER — LABETALOL HCL 5 MG/ML IV SOLN: 80.0000 mg | INTRAVENOUS | Status: DC | PRN

## 2019-10-05 MED ORDER — OXYTOCIN-SODIUM CHLORIDE 30-0.9 UT/500ML-% IV SOLN: 1.0000 m[IU]/min | INTRAVENOUS | Status: DC

## 2019-10-05 MED ORDER — PHENYLEPHRINE HCL-NACL 20-0.9 MG/250ML-% IV SOLN
INTRAVENOUS | Status: DC | PRN
  Administered 2019-10-05: 60 ug/min via INTRAVENOUS

## 2019-10-05 MED ORDER — ACETAMINOPHEN 325 MG PO TABS: 650.0000 mg | ORAL_TABLET | ORAL | Status: DC | PRN

## 2019-10-05 MED ORDER — FENTANYL CITRATE (PF) 100 MCG/2ML IJ SOLN
INTRAMUSCULAR | Status: DC | PRN
  Administered 2019-10-05: 15 ug via INTRATHECAL

## 2019-10-05 MED ORDER — OXYCODONE-ACETAMINOPHEN 5-325 MG PO TABS: 2.0000 | ORAL_TABLET | ORAL | Status: DC | PRN

## 2019-10-05 MED ORDER — PROMETHAZINE HCL 25 MG/ML IJ SOLN
INTRAMUSCULAR | Status: AC
  Filled 2019-10-05: qty 1

## 2019-10-05 MED ORDER — DEXTROSE-NACL 5-0.45 % IV SOLN: INTRAVENOUS | Status: DC

## 2019-10-05 MED ORDER — MORPHINE SULFATE (PF) 0.5 MG/ML IJ SOLN
INTRAMUSCULAR | Status: DC | PRN
  Administered 2019-10-05: .15 mg via INTRATHECAL

## 2019-10-05 MED ORDER — CEFAZOLIN SODIUM-DEXTROSE 2-4 GM/100ML-% IV SOLN
2.0000 g | Freq: Once | INTRAVENOUS | Status: AC
  Administered 2019-10-05: 2 g via INTRAVENOUS
  Filled 2019-10-05: qty 100

## 2019-10-05 MED ORDER — FENTANYL CITRATE (PF) 100 MCG/2ML IJ SOLN
100.0000 ug | INTRAMUSCULAR | Status: DC | PRN
  Administered 2019-10-05 (×3): 100 ug via INTRAVENOUS
  Filled 2019-10-05 (×3): qty 2

## 2019-10-05 MED ORDER — LABETALOL HCL 5 MG/ML IV SOLN: 20.0000 mg | INTRAVENOUS | Status: DC | PRN

## 2019-10-05 MED ORDER — CEFAZOLIN SODIUM-DEXTROSE 2-3 GM-%(50ML) IV SOLR
INTRAVENOUS | Status: DC | PRN
  Administered 2019-10-05: 2 g via INTRAVENOUS

## 2019-10-05 MED ORDER — LACTATED RINGERS IV SOLN: 500.0000 mL | INTRAVENOUS | Status: DC | PRN

## 2019-10-05 MED ORDER — LACTATED RINGERS IV SOLN: INTRAVENOUS | Status: DC | PRN

## 2019-10-05 MED ORDER — SODIUM CHLORIDE 0.9% IV SOLUTION: Freq: Once | INTRAVENOUS | Status: DC

## 2019-10-05 MED ORDER — MISOPROSTOL 50MCG HALF TABLET
50.0000 ug | ORAL_TABLET | ORAL | Status: DC | PRN
  Administered 2019-10-05 (×3): 50 ug via ORAL
  Filled 2019-10-05 (×2): qty 1

## 2019-10-05 MED ORDER — MORPHINE SULFATE (PF) 0.5 MG/ML IJ SOLN
INTRAMUSCULAR | Status: AC
  Filled 2019-10-05: qty 10

## 2019-10-05 MED ORDER — SODIUM CHLORIDE 0.9 % IV SOLN: INTRAVENOUS | Status: DC

## 2019-10-05 MED ORDER — BUPIVACAINE IN DEXTROSE 0.75-8.25 % IT SOLN
INTRATHECAL | Status: DC | PRN
  Administered 2019-10-05: 1.4 mL via INTRATHECAL

## 2019-10-05 MED ORDER — LIDOCAINE HCL (PF) 1 % IJ SOLN: 30.0000 mL | INTRAMUSCULAR | Status: DC | PRN

## 2019-10-05 MED ORDER — PHENYLEPHRINE 40 MCG/ML (10ML) SYRINGE FOR IV PUSH (FOR BLOOD PRESSURE SUPPORT)
PREFILLED_SYRINGE | INTRAVENOUS | Status: AC
  Filled 2019-10-05: qty 10

## 2019-10-05 MED ORDER — HYDRALAZINE HCL 20 MG/ML IJ SOLN: 10.0000 mg | INTRAMUSCULAR | Status: DC | PRN

## 2019-10-05 MED ORDER — OXYTOCIN BOLUS FROM INFUSION: 500.0000 mL | Freq: Once | INTRAVENOUS | Status: DC

## 2019-10-05 MED ORDER — ONDANSETRON HCL 4 MG/2ML IJ SOLN
INTRAMUSCULAR | Status: DC | PRN
  Administered 2019-10-05: 4 mg via INTRAVENOUS

## 2019-10-05 SURGICAL SUPPLY — 38 items
BENZOIN TINCTURE PRP APPL 2/3 (GAUZE/BANDAGES/DRESSINGS) ×3 IMPLANT
CANISTER SUCT 3000ML PPV (MISCELLANEOUS) ×3 IMPLANT
CHLORAPREP W/TINT 26ML (MISCELLANEOUS) ×3 IMPLANT
CLOSURE WOUND 1/2 X4 (GAUZE/BANDAGES/DRESSINGS) ×1
CLOSURE WOUND 1/4 X3 (GAUZE/BANDAGES/DRESSINGS) ×1
DRSG OPSITE POSTOP 4X10 (GAUZE/BANDAGES/DRESSINGS) ×3 IMPLANT
ELECT REM PT RETURN 9FT ADLT (ELECTROSURGICAL) ×3
ELECTRODE REM PT RTRN 9FT ADLT (ELECTROSURGICAL) ×1 IMPLANT
EXTRACTOR VACUUM KIWI (MISCELLANEOUS) ×3 IMPLANT
GLOVE BIOGEL PI IND STRL 7.0 (GLOVE) ×2 IMPLANT
GLOVE BIOGEL PI IND STRL 7.5 (GLOVE) ×1 IMPLANT
GLOVE BIOGEL PI INDICATOR 7.0 (GLOVE) ×4
GLOVE BIOGEL PI INDICATOR 7.5 (GLOVE) ×2
GLOVE SKINSENSE NS SZ7.0 (GLOVE) ×2
GLOVE SKINSENSE STRL SZ7.0 (GLOVE) ×1 IMPLANT
GOWN STRL REUS W/ TWL LRG LVL3 (GOWN DISPOSABLE) ×2 IMPLANT
GOWN STRL REUS W/ TWL XL LVL3 (GOWN DISPOSABLE) ×1 IMPLANT
GOWN STRL REUS W/TWL LRG LVL3 (GOWN DISPOSABLE) ×6
GOWN STRL REUS W/TWL XL LVL3 (GOWN DISPOSABLE) ×3
NS IRRIG 1000ML POUR BTL (IV SOLUTION) ×3 IMPLANT
PACK C SECTION WH (CUSTOM PROCEDURE TRAY) ×3 IMPLANT
PAD ABD 7.5X8 STRL (GAUZE/BANDAGES/DRESSINGS) ×3 IMPLANT
PAD OB MATERNITY 4.3X12.25 (PERSONAL CARE ITEMS) ×3 IMPLANT
PAD PREP 24X48 CUFFED NSTRL (MISCELLANEOUS) ×3 IMPLANT
PENCIL SMOKE EVAC W/HOLSTER (ELECTROSURGICAL) ×3 IMPLANT
PREVENA RESTOR AXIOFORM 29X28 (GAUZE/BANDAGES/DRESSINGS) ×2 IMPLANT
STRIP CLOSURE SKIN 1/2X4 (GAUZE/BANDAGES/DRESSINGS) ×2 IMPLANT
STRIP CLOSURE SKIN 1/4X3 (GAUZE/BANDAGES/DRESSINGS) ×1 IMPLANT
SUT MNCRL 0 VIOLET CTX 36 (SUTURE) ×2 IMPLANT
SUT MON AB 4-0 PS1 27 (SUTURE) ×3 IMPLANT
SUT MONOCRYL 0 CTX 36 (SUTURE) ×6
SUT PLAIN 2 0 XLH (SUTURE) ×3 IMPLANT
SUT VIC AB 0 CT1 36 (SUTURE) ×6 IMPLANT
SUT VIC AB 3-0 CT1 27 (SUTURE) ×3
SUT VIC AB 3-0 CT1 TAPERPNT 27 (SUTURE) ×1 IMPLANT
SUT VIC AB 4-0 KS 27 (SUTURE) ×2 IMPLANT
TOWEL OR 17X24 6PK STRL BLUE (TOWEL DISPOSABLE) ×6 IMPLANT
WATER STERILE IRR 1000ML POUR (IV SOLUTION) ×3 IMPLANT

## 2019-10-05 NOTE — Progress Notes (Signed)
Labor Progress Note Ashley Freeman is a 29 y.o. G2P0010 at [redacted]w[redacted]d presented for cHTN w/si PEC, severe features.  S:  Comfortable, recently premedicated for exam.  O:  BP 130/86   Pulse 97   Temp 98.3 F (36.8 C) (Oral)   Resp 20   Ht 5\' 3"  (1.6 m)   Wt 85.7 kg   LMP  (LMP Unknown)   SpO2 92%   BMI 33.48 kg/m  EFM: baseline 145 bpm/ mod variability/ no accels/ no decels  Toco/IUPC: irritability SVE: closed/long   A/P: 29 y.o. G2P0010 [redacted]w[redacted]d  1. Labor: IOL, continue Cytotec, place FB when able 2. FWB: Cat I 3. Pain: analgesia/anesthesia prn 4. Type I DM: more elevated BS, continue endotool 5. cHTN w/si PEC, severe: BP stable, continue Mg, labs q6 6. GBS bacteriuria: continue Ancef   Julianne Handler, CNM 6:03 PM

## 2019-10-05 NOTE — Progress Notes (Signed)
Pt complained of SOB; Assessed Lungs and asked provider to reassess. Pt was repositioned to sitting and pulse ox was applied. O2 sats ranged from 87-92. Provider ordered 2 liters of oxygen and nebulizing treatment. See providers note for further assessment.

## 2019-10-05 NOTE — Progress Notes (Signed)
CBG collected from pts monitor. CBG 122-used Endotool to adjust insulin

## 2019-10-05 NOTE — Progress Notes (Signed)
CBG taking from Dexcom-- 145. Endotool adjusted

## 2019-10-05 NOTE — Progress Notes (Signed)
CBG collected from [facility]- 98

## 2019-10-05 NOTE — Progress Notes (Signed)
CBG Dexcom- 88, Insulin increased to 0.4

## 2019-10-05 NOTE — Progress Notes (Signed)
Insulin Increased to 0.5

## 2019-10-05 NOTE — Progress Notes (Signed)
OB Note  CBC Latest Ref Rng & Units 10/05/2019 10/04/2019 10/03/2019  WBC 4.0 - 10.5 K/uL 17.2(H) 15.4(H) 9.1  Hemoglobin 12.0 - 15.0 g/dL 8.1(L) 7.8(L) 8.1(L)  Hematocrit 36.0 - 46.0 % 25.4(L) 24.1(L) 24.5(L)  Platelets 150 - 400 K/uL 319 301 265   CMP Latest Ref Rng & Units 10/05/2019 10/04/2019 10/03/2019  Glucose 70 - 99 mg/dL 79 90 79  BUN 6 - 20 mg/dL 12 11 6   Creatinine 0.44 - 1.00 mg/dL 1.28(H) 1.34(H) 1.27(H)  Sodium 135 - 145 mmol/L 135 136 137  Potassium 3.5 - 5.1 mmol/L 4.5 4.0 3.9  Chloride 98 - 111 mmol/L 113(H) 110 110  CO2 22 - 32 mmol/L 16(L) 16(L) 17(L)  Calcium 8.9 - 10.3 mg/dL 8.0(L) 8.0(L) 7.9(L)  Total Protein 6.5 - 8.1 g/dL 4.6(L) 4.7(L) 4.7(L)  Total Bilirubin 0.3 - 1.2 mg/dL 0.5 0.4 0.5  Alkaline Phos 38 - 126 U/L 67 70 68  AST 15 - 41 U/L 72(H) 75(H) 81(H)  ALT 0 - 44 U/L 47(H) 48(H) 45(H)   Plan of care d/w pt. Bedside u/s cephalic, normal FHR, subj normal AF   *IOL: difficult exam due to vulvar edema. Will start with PO cytotec and may have to pre-med before next cx check *DM1: pt has pump and continuous CBG. Plan for q4h checks and q1h once >5cm dilated *Severe pre-eclampsia superimposed on cHTN: based on AKI and AST/ALT and likely worsened acute on chronic proteinuria. . Patient for IOL. Will get q4-6h labs, including Mg level and will do coags, fibrinogen with next draw at noon. Will do 4gm Mg bolus and 1gm/hr maintenance and follow UOP and exam closely. Continue labetalol 200 bid.  *Preterm: s/p nicu consult. S/p bmz on 6/5 and 6/6 *Anemia: s/p IV feraheme x 1 on 6/3. Pt is A pos with no antibodies.  *GBS urine: h/o keflex use w/o issue. Will start ancef *Edema: see above. SCDs   Durene Romans MD Attending Center for [employer] (Faculty Practice) 10/05/2019 Time: [phone]

## 2019-10-05 NOTE — Progress Notes (Signed)
CBG on Dexcom-74

## 2019-10-05 NOTE — Anesthesia Preprocedure Evaluation (Signed)
Anesthesia Evaluation  Patient identified by MRN, date of birth, ID band Patient awake    Reviewed: Allergy & Precautions, NPO status , Patient's Chart, lab work & pertinent test results  Airway Mallampati: II  TM Distance: >3 FB Neck ROM: Full    Dental no notable dental hx. (+) Dental Advisory Given   Pulmonary shortness of breath,  PreE with pulmonary edema- worsening crackles in bases of lungs, desaturations- now on [place] 4LPM  Crackles B/L lung bases   + decreased breath sounds      Cardiovascular hypertension, Normal cardiovascular exam Rhythm:Regular Rate:Normal  preE with SF    Neuro/Psych negative neurological ROS  negative psych ROS   GI/Hepatic GERD  Medicated and Controlled,Elevated AST, ALT    Endo/Other  diabetes, Poorly Controlled, Type 1, Insulin DependentPoorly controlled T1DM since 29yo- multiple perirectal abscesses in past, 2 episodes of DKA 2015 and 2018   On insulin gtt 0.5U/h, endotool, using patient's own glucose monitor- last glucose 95  Obesity BMI 34  Renal/GU ARFRenal diseaseAKI Cr 1.2  negative genitourinary   Musculoskeletal negative musculoskeletal ROS (+)   Abdominal (+) + obese,   Peds negative pediatric ROS (+)  Hematology  (+) Blood dyscrasia, anemia , hct 26, plt 332- 4 hours ago   Anesthesia Other Findings Severe labial swelling with preE  Reproductive/Obstetrics (+) Pregnancy preE with severe features, attempting IOL- worsening pulmonary status, decision to proceed with c section  On magnesium                              Anesthesia Physical Anesthesia Plan  ASA: III and emergent  Anesthesia Plan: Spinal   Post-op Pain Management:    Induction:   PONV Risk Score and Plan: 3 and Ondansetron and Treatment may vary due to age or medical condition  Airway Management Planned: Natural Airway, Nasal Cannula and Simple Face Mask  Additional  Equipment: None  Intra-op Plan:   Post-operative Plan:   Informed Consent: I have reviewed the patients History and Physical, chart, labs and discussed the procedure including the risks, benefits and alternatives for the proposed anesthesia with the patient or authorized representative who has indicated his/her understanding and acceptance.       Plan Discussed with: CRNA  Anesthesia Plan Comments:         Anesthesia Quick Evaluation

## 2019-10-05 NOTE — Progress Notes (Signed)
Called to room by RN for increased SOB, resolved somewhat with elevation of the head to 45 degrees (unable to sit up straight due to right labial swelling).  2L 02 increased O2 sat from 89-91% to 95-98%  Vitals:   10/05/19 2135 10/05/19 2155  BP:  131/87  Pulse:  91  Resp:    Temp:    SpO2: 94% 97%   On physical exam patient has crackles in the bases of her lungs and marked edema up to the level of her ASIS bilaterally, as well as sacral edema. SVE: 0.5/thick/-3  Case discussed with Dr. Ilda Basset.  Recommend primary Cesarean section 2/2 worsening pre-E with severe features, remote from delivery, now with bibasilar crackles and O2 requirements.  The risks of cesarean section were discussed with the patient including but were not limited to: bleeding which may require transfusion or reoperation; infection which may require antibiotics; injury to bowel, bladder, ureters or other surrounding organs; injury to the fetus; need for additional procedures including hysterectomy in the event of a life-threatening hemorrhage; placental abnormalities wth subsequent pregnancies, incisional problems, thromboembolic phenomenon and other postoperative/anesthesia complications. Patient has been NPO since 0400 she will remain NPO for procedure. Anesthesia and OR aware.  Preoperative prophylactic antibiotics and SCDs ordered on call to the OR.  To OR when ready.  Merilyn Baba, DO OB Fellow, Faculty Practice 10/05/2019 10:17 PM

## 2019-10-05 NOTE — Progress Notes (Signed)
Labor Progress Note Ashley Freeman is a 29 y.o. G2P0010 at [redacted]w[redacted]d presented for cHTN w/si PEC, severe features (AKI, elevated LFTs)  O:  BP (!) 141/86   Pulse 99   Temp 98.5 F (36.9 C) (Oral)   Resp 20   Ht 5\' 3"  (1.6 m)   Wt 85.7 kg   LMP  (LMP Unknown)   SpO2 92%   BMI 33.48 kg/m  EFM: baseline 150 bpm/ mod variability/ + accels/ no decels  Toco/IUPC: irritability SVE: deferred  A/P: 29 y.o. G2P0010 [redacted]w[redacted]d  1. Labor: IOL, continue ripening with Cytotec, attempt cervical check 4 hrs after second dose 2. FWB: Cat I 3. Pain: analgesia/anesthesia prn 4. Type I DM: stable on endotool 5. cHTN w/si PEC, severe: stable Ct and liver enzymes, Coags normal, Mg level not therapeutic, discussed with Dr. Ilda Basset, will increase to 2g/hr 6. GBS bacteriuria: continue Ancef   Ashley Freeman, CNM 1:25 PM

## 2019-10-05 NOTE — Progress Notes (Signed)
OB Note  Patient with worsening HELLP with crackles on lung exam and needing nasal cannula to maintain O2 sats; it is hard for patient to sit up b/c of the now sacral and mons edema. UOP is adequate but not robust. And on exam she is still on fingertip.  I told her that she is very remote from delivery and I feel that her condition will only worsen with time and that a c-section now is best for her and baby before she worsens. Pt is amenable to plan  Durene Romans MD Attending Center for [place] (Faculty Practice) 10/05/2019 Time: 2210

## 2019-10-05 NOTE — Progress Notes (Signed)
CBG on Dexcom- 138

## 2019-10-05 NOTE — Progress Notes (Signed)
CBG on Dexcom-134

## 2019-10-05 NOTE — Progress Notes (Signed)
CBG Dexcom- 84, Insulin back on per Endotool

## 2019-10-05 NOTE — Progress Notes (Signed)
Inpatient Diabetes Program Recommendations  Diabetes Treatment Program Recommendations  ADA Standards of Care 2018 Diabetes in Pregnancy Target Glucose Ranges:  Fasting: 60 - 90 mg/dL Preprandial: 60 - 105 mg/dL 1 hr postprandial: Less than 140mg /dL (from first bite of meal) 2 hr postprandial: Less than 120 mg/dL (from first bite of meal)    Lab Results  Component Value Date   GLUCAP 124 (H) 10/04/2019   HGBA1C 6.8 (H) 09/27/2019    Review of Glycemic Control Results for Ashley Freeman, Ashley Freeman (MRN [id number]) as of 10/05/2019 08:45  Ref. Range 10/03/2019 10:28 10/04/2019 21:52 10/04/2019 23:02  Glucose-Capillary Latest Ref Range: 70 - 99 mg/dL 45 (L) 117 (H) 124 (H)   Diabetes history: Type 1 DM Outpatient Diabetes medications:  Omnipod rates in Pregnancy Basal insulin  0000-0800       0.60 units/hour 0801-0000       0.550 units/hour Total daily basal insulin: 13.6 units/24 hours Carb Coverage 0000-1130       1:14     1 unit for every 14 grams of carbohydrates 1130-2200       1:12     1 unit for every 12 grams of carbohydrates 2200-0000       1:14     1 unit for every 14 grams of carbohydrates Insulin Sensitivity 1:60     1 unit drops blood glucose 60 mg/dl Target Glucose Goals 0000-0000       100 mg/dl Current orders for Inpatient glycemic control: IV insulin  BMZx2  Inpatient Diabetes Program Recommendations:    Noted hypoglycemic event yesterday, assuming following patient bolus (undocumented). Also, patient transferred to L&D for IOL.   Continue with IV insulin during Labor.   At delivery of placenta: -Recommend reapplying insulin pump with decreased rates (see below). Allow 2 hours of overlap prior to discontinuing IV insulin.   Basal rates 0000-0000       0.50 units/hour Total daily basal insulin: 12 units/24 hours  Carb Coverage 0000-0000       1:16     1 unit for every 16 grams of carbohydrates Anticipate patient to remain elevated due to steroids  still on board.   -Add Novolog 0-6 units Q4H   Spoke with patient and visitor at bedside. Education provided to patient to ensure application following delivery and that settings are updated to reflect postpartum insulin needs.  Discussed patient with [facility]. Stressed the importance of ensure placement of insulin pump following delivery and allowing 2 hours overlap with IV insulin, reviewed CO2 results, anion gap and encouraged to continue with D5% @75  ml/hr.  Will continue to follow.   Thanks, Bronson Curb, MSN, CDE, RNC-OB Diabetes Coordinator [phone] (8a-5p)

## 2019-10-05 NOTE — Progress Notes (Signed)
CBG Dexcom- 83, Insulin decreased to 0.3

## 2019-10-05 NOTE — Progress Notes (Signed)
Labor Progress Note Ashley Freeman is a 29 y.o. G2P0010 at [redacted]w[redacted]d presented for cHTN w/si PEC, severe features  S:  Having some discomfort in her vaginal area, using ice packs. +FM.   O:  BP (!) 149/91   Pulse (!) 111   Temp 97.9 F (36.6 C) (Oral)   Resp 20   Ht 5\' 3"  (1.6 m)   Wt 85.7 kg   LMP  (LMP Unknown)   SpO2 98%   BMI 33.48 kg/m  EFM: baseline 150 bpm/ mod variability/ + accels/ no decels  Toco/IUPC: none SVE: Presentation: Vertex(confirmed by b/s [place]) Attempted but pt intolerable  A/P: 29 y.o. G2P0010 [redacted]w[redacted]d  1. Labor: IOL 2. FWB: Cat I 3. Pain: analgesia/anesthesia prn 4. Type I DM: stable 5. cHTN w/si PEC, severe: worsening Ct and liver enzymes 6. Anemia: s/p Feraheme, stable 7. GBS bacteruria  Cytotec for ripening, unable to tolerate vaginal exam d/t significant vulvar edema; consider premedication before attempt of cervical check Continue endotool insulin gtt, pt using pump for BS only Start MgSO4; check labs q6 Start Ancef, PCN allergy but tolerated in cephalosporins in past Discussed with pt IOL will likely be a lengthy process and she would need cervical ripening first and MgSO4 for seizure prophylaxis, pt verbalized understanding. Family at bedside for discussion.  Julianne Handler, CNM 9:14 AM

## 2019-10-05 NOTE — Progress Notes (Signed)
CBG collected from [facility]- 90

## 2019-10-05 NOTE — Progress Notes (Signed)
CBG Dexcom- 91, Increased to 0.6

## 2019-10-06 ENCOUNTER — Encounter (HOSPITAL_COMMUNITY): Payer: Self-pay | Admitting: Obstetrics and Gynecology

## 2019-10-06 ENCOUNTER — Telehealth: Payer: Self-pay | Admitting: Radiology

## 2019-10-06 LAB — CBC
HCT: 24.3 % — ABNORMAL LOW (ref 36.0–46.0)
HCT: 25.5 % — ABNORMAL LOW (ref 36.0–46.0)
Hemoglobin: 7.8 g/dL — ABNORMAL LOW (ref 12.0–15.0)
Hemoglobin: 8.2 g/dL — ABNORMAL LOW (ref 12.0–15.0)
MCH: 28.6 pg (ref 26.0–34.0)
MCH: 28.7 pg (ref 26.0–34.0)
MCHC: 32.1 g/dL (ref 30.0–36.0)
MCHC: 32.2 g/dL (ref 30.0–36.0)
MCV: 89 fL (ref 80.0–100.0)
MCV: 89.2 fL (ref 80.0–100.0)
Platelets: 303 10*3/uL (ref 150–400)
Platelets: 313 10*3/uL (ref 150–400)
RBC: 2.73 MIL/uL — ABNORMAL LOW (ref 3.87–5.11)
RBC: 2.86 MIL/uL — ABNORMAL LOW (ref 3.87–5.11)
RDW: 14.5 % (ref 11.5–15.5)
RDW: 14.3 % (ref 11.5–15.5)
WBC: 13.2 10*3/uL — ABNORMAL HIGH (ref 4.0–10.5)
WBC: 15.4 10*3/uL — ABNORMAL HIGH (ref 4.0–10.5)
nRBC: 0 % (ref 0.0–0.2)
nRBC: 0 % (ref 0.0–0.2)

## 2019-10-06 LAB — COMPREHENSIVE METABOLIC PANEL
ALT: 43 U/L (ref 0–44)
AST: 66 U/L — ABNORMAL HIGH (ref 15–41)
Albumin: 1.5 g/dL — ABNORMAL LOW (ref 3.5–5.0)
Alkaline Phosphatase: 66 U/L (ref 38–126)
Anion gap: 7 (ref 5–15)
BUN: 9 mg/dL (ref 6–20)
CO2: 18 mmol/L — ABNORMAL LOW (ref 22–32)
Calcium: 7.4 mg/dL — ABNORMAL LOW (ref 8.9–10.3)
Chloride: 108 mmol/L (ref 98–111)
Creatinine, Ser: 1.2 mg/dL — ABNORMAL HIGH (ref 0.44–1.00)
GFR calc Af Amer: >60 mL/min (ref >60)
GFR calc non Af Amer: >60 mL/min (ref >60)
Glucose, Bld: 65 mg/dL — ABNORMAL LOW (ref 70–99)
Potassium: 3.9 mmol/L (ref 3.5–5.1)
Sodium: 133 mmol/L — ABNORMAL LOW (ref 135–145)
Total Bilirubin: <0.1 mg/dL — ABNORMAL LOW (ref 0.3–1.2)
Total Protein: 4.7 g/dL — ABNORMAL LOW (ref 6.5–8.1)

## 2019-10-06 LAB — MAGNESIUM
Magnesium: 6.8 mg/dL (ref 1.7–2.4)
Magnesium: 7.6 mg/dL (ref 1.7–2.4)

## 2019-10-06 MED ORDER — KETOROLAC TROMETHAMINE 30 MG/ML IJ SOLN: 30.0000 mg | Freq: Four times a day (QID) | INTRAMUSCULAR | Status: AC | PRN

## 2019-10-06 MED ORDER — MAGNESIUM SULFATE 40 GM/1000ML IV SOLN: 2.0000 g/h | INTRAVENOUS | Status: AC

## 2019-10-06 MED ORDER — PANTOPRAZOLE SODIUM 40 MG IV SOLR
40.0000 mg | INTRAVENOUS | Status: DC
  Administered 2019-10-06: 40 mg via INTRAVENOUS
  Filled 2019-10-06: qty 40

## 2019-10-06 MED ORDER — OXYCODONE HCL 5 MG PO TABS: 5.0000 mg | ORAL_TABLET | Freq: Once | ORAL | Status: DC | PRN

## 2019-10-06 MED ORDER — OXYTOCIN-SODIUM CHLORIDE 30-0.9 UT/500ML-% IV SOLN: 2.5000 [IU]/h | INTRAVENOUS | Status: AC

## 2019-10-06 MED ORDER — NALBUPHINE HCL 10 MG/ML IJ SOLN: 5.0000 mg | Freq: Once | INTRAMUSCULAR | Status: DC | PRN

## 2019-10-06 MED ORDER — SODIUM CHLORIDE 0.9 % IR SOLN
Status: DC | PRN
  Administered 2019-10-06: 1

## 2019-10-06 MED ORDER — WITCH HAZEL-GLYCERIN EX PADS: 1.0000 "application " | MEDICATED_PAD | CUTANEOUS | Status: DC | PRN

## 2019-10-06 MED ORDER — PRENATAL MULTIVITAMIN CH
1.0000 | ORAL_TABLET | Freq: Every day | ORAL | Status: DC
  Administered 2019-10-07: 1 via ORAL
  Filled 2019-10-06: qty 1

## 2019-10-06 MED ORDER — ENOXAPARIN SODIUM 40 MG/0.4ML ~~LOC~~ SOLN
40.0000 mg | SUBCUTANEOUS | Status: DC
  Administered 2019-10-07 (×2): 40 mg via SUBCUTANEOUS
  Filled 2019-10-06 (×2): qty 0.4

## 2019-10-06 MED ORDER — PROMETHAZINE HCL 25 MG/ML IJ SOLN
25.0000 mg | Freq: Four times a day (QID) | INTRAMUSCULAR | Status: DC | PRN
  Administered 2019-10-06: 25 mg via INTRAVENOUS
  Filled 2019-10-06: qty 1

## 2019-10-06 MED ORDER — OXYTOCIN-SODIUM CHLORIDE 30-0.9 UT/500ML-% IV SOLN
INTRAVENOUS | Status: DC | PRN
  Administered 2019-10-05: 41.7 mL/h via INTRAVENOUS

## 2019-10-06 MED ORDER — MENTHOL 3 MG MT LOZG: 1.0000 | LOZENGE | OROMUCOSAL | Status: DC | PRN

## 2019-10-06 MED ORDER — NALBUPHINE HCL 10 MG/ML IJ SOLN: 5.0000 mg | INTRAMUSCULAR | Status: DC | PRN

## 2019-10-06 MED ORDER — MEPERIDINE HCL 25 MG/ML IJ SOLN: 6.2500 mg | INTRAMUSCULAR | Status: DC | PRN

## 2019-10-06 MED ORDER — PROMETHAZINE HCL 25 MG/ML IJ SOLN: 6.2500 mg | INTRAMUSCULAR | Status: DC | PRN

## 2019-10-06 MED ORDER — KETOROLAC TROMETHAMINE 30 MG/ML IJ SOLN
30.0000 mg | Freq: Four times a day (QID) | INTRAMUSCULAR | Status: AC | PRN
  Administered 2019-10-06: 30 mg via INTRAVENOUS
  Filled 2019-10-06: qty 1

## 2019-10-06 MED ORDER — NALOXONE HCL 4 MG/10ML IJ SOLN
1.0000 ug/kg/h | INTRAVENOUS | Status: DC | PRN
  Filled 2019-10-06: qty 5

## 2019-10-06 MED ORDER — OXYCODONE HCL 5 MG PO TABS
5.0000 mg | ORAL_TABLET | ORAL | Status: DC | PRN
  Administered 2019-10-07 – 2019-10-08 (×2): 5 mg via ORAL
  Administered 2019-10-08: 10 mg via ORAL
  Filled 2019-10-06 (×2): qty 1
  Filled 2019-10-06: qty 2

## 2019-10-06 MED ORDER — STERILE WATER FOR IRRIGATION IR SOLN
Status: DC | PRN
  Administered 2019-10-06: 1000 mL

## 2019-10-06 MED ORDER — HYDROMORPHONE HCL 1 MG/ML IJ SOLN: 0.2500 mg | INTRAMUSCULAR | Status: DC | PRN

## 2019-10-06 MED ORDER — SENNOSIDES-DOCUSATE SODIUM 8.6-50 MG PO TABS: 2.0000 | ORAL_TABLET | Freq: Every evening | ORAL | Status: DC | PRN

## 2019-10-06 MED ORDER — ONDANSETRON HCL 4 MG/2ML IJ SOLN
4.0000 mg | Freq: Three times a day (TID) | INTRAMUSCULAR | Status: DC | PRN
  Administered 2019-10-06: 4 mg via INTRAVENOUS
  Filled 2019-10-06: qty 2

## 2019-10-06 MED ORDER — MAGNESIUM SULFATE 40 GM/1000ML IV SOLN: 2.0000 g/h | INTRAVENOUS | Status: DC

## 2019-10-06 MED ORDER — DIBUCAINE (PERIANAL) 1 % EX OINT: 1.0000 "application " | TOPICAL_OINTMENT | CUTANEOUS | Status: DC | PRN

## 2019-10-06 MED ORDER — COCONUT OIL OIL: 1.0000 "application " | TOPICAL_OIL | Status: DC | PRN

## 2019-10-06 MED ORDER — INSULIN DETEMIR 100 UNIT/ML ~~LOC~~ SOLN
5.0000 [IU] | Freq: Every day | SUBCUTANEOUS | Status: DC
  Administered 2019-10-06: 5 [IU] via SUBCUTANEOUS
  Filled 2019-10-06 (×2): qty 0.05

## 2019-10-06 MED ORDER — INSULIN ASPART 100 UNIT/ML ~~LOC~~ SOLN
0.0000 [IU] | SUBCUTANEOUS | Status: DC
  Administered 2019-10-07 (×2): 1 [IU] via SUBCUTANEOUS

## 2019-10-06 MED ORDER — ACETAMINOPHEN 500 MG PO TABS: 1000.0000 mg | ORAL_TABLET | Freq: Four times a day (QID) | ORAL | Status: AC

## 2019-10-06 MED ORDER — GABAPENTIN 100 MG PO CAPS
100.0000 mg | ORAL_CAPSULE | Freq: Three times a day (TID) | ORAL | Status: DC
  Administered 2019-10-06 – 2019-10-08 (×6): 100 mg via ORAL
  Filled 2019-10-06 (×6): qty 1

## 2019-10-06 MED ORDER — OXYCODONE HCL 5 MG/5ML PO SOLN: 5.0000 mg | Freq: Once | ORAL | Status: DC | PRN

## 2019-10-06 MED ORDER — LACTATED RINGERS IV SOLN: INTRAVENOUS | Status: DC

## 2019-10-06 MED ORDER — KETOROLAC TROMETHAMINE 30 MG/ML IJ SOLN: 30.0000 mg | Freq: Once | INTRAMUSCULAR | Status: DC | PRN

## 2019-10-06 MED ORDER — SCOPOLAMINE 1 MG/3DAYS TD PT72: 1.0000 | MEDICATED_PATCH | Freq: Once | TRANSDERMAL | Status: DC

## 2019-10-06 MED ORDER — SODIUM CHLORIDE 0.9% FLUSH
3.0000 mL | INTRAVENOUS | Status: DC | PRN
  Administered 2019-10-08: 3 mL via INTRAVENOUS

## 2019-10-06 MED ORDER — NALOXONE HCL 0.4 MG/ML IJ SOLN: 0.4000 mg | INTRAMUSCULAR | Status: DC | PRN

## 2019-10-06 MED ORDER — SIMETHICONE 80 MG PO CHEW
80.0000 mg | CHEWABLE_TABLET | Freq: Three times a day (TID) | ORAL | Status: DC
  Administered 2019-10-06 – 2019-10-08 (×4): 80 mg via ORAL
  Filled 2019-10-06 (×4): qty 1

## 2019-10-06 MED ORDER — TETANUS-DIPHTH-ACELL PERTUSSIS 5-2.5-18.5 LF-MCG/0.5 IM SUSP: 0.5000 mL | Freq: Once | INTRAMUSCULAR | Status: DC

## 2019-10-06 NOTE — Telephone Encounter (Signed)
Left voicemail on cell phone and sent mychart message with updated appointment dates and times.

## 2019-10-06 NOTE — Anesthesia Procedure Notes (Signed)
Spinal  Patient location during procedure: OR Start time: 10/05/2019 11:28 PM End time: 10/05/2019 11:31 PM Preanesthetic Checklist Completed: patient identified, IV checked, site marked, risks and benefits discussed, surgical consent, monitors and equipment checked, pre-op evaluation and timeout performed Spinal Block Patient position: sitting Prep: DuraPrep Patient monitoring: heart rate, cardiac monitor, continuous pulse ox and blood pressure Approach: midline Location: L3-4 Injection technique: single-shot Needle Needle type: Sprotte  Needle gauge: 24 G Needle length: 9 cm Assessment Sensory level: T4

## 2019-10-06 NOTE — Discharge Summary (Signed)
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 yo G2P0111 at 368w2das admitted to the hospital 09/27/2019 initially for what was thought to be a large labial inflammatory edematous area extending from above the clitoris on the right to the perineal body. Given her history of Bartholin abscess and Type I DM, she was thought to have an underlying infection, and was placed on IV Vancomycin and Flagyl. She was taken to the OR on 5/31 for I&D of her right labia as there was an area of tissue weeping- she was found to have over 200 cc of clear, nonpurulent serous fluid (no identifiable abscess was found).  While recovering in the antenatal unit, she had worsening pain in her labia on POD#2 and had a pelvic CT scan due to her risk of necrotizing fasciitis- which was negative. Unfortunately, her chronic hypertension worsened, and she was found to have superimposed pre-eclampsia with severe features. Her antibiotics  were stopped, as her labial swelling was thought to be part of the significant LE edema secondary to nephrotic range proteinuria. Blood pressure medications were increased, and MFM was consulted.   She received a course of BMZ, and delivery was indicated due to worsening creatinine and transaminitis in the context of severe pre-eclampsia superimposed on chronic hypertension.  Patient had a labor course significant for 3 doses of cytotec. Right before the 4th dose of cytotec was due, the patient was found to be suddenly short of breath and hypoxic with O2 saturations 89-91%. This improved slightly with elevation of the patient's head, however the patient also had bibasilar crackles and dependent edema up to her ASIS and sacrum bilaterally.  The patient went for cesarean section due to  worsening HELLP remote from delivery . Delivery details are as follows: Membrane Rupture Time/Date: 11:51 PM ,10/06/2019   Delivery Method:C-Section, Low Transverse  Details of operation can be found in separate operative note.  Patient had an uncomplicated postpartum course. She received magnesium sulfate for 24 hours post delivery and was started on procardia and HCTZ. She is ambulating, tolerating a regular diet, passing flatus, and urinating well.  Patient is discharged home in stable condition on 10/08/19.      Newborn Data: Birth date:10/05/2019  Birth time:11:52 PM  Gender:Female  Living status:Living  Apgars:5 ,9  Weight:1500 g  Magnesium Sulfate received: Yes: Seizure prophylaxis BMZ received: Yes Rhophylac:N/A MMR:N/A T-DaP:Given prenatally Flu: N/A Transfusion:No  Physical exam  Vitals:   10/07/19 2148 10/08/19 0242 10/08/19 0602 10/08/19 0818  BP: (!) 149/88 130/71 (!) 141/75 (!) 153/85  Pulse: (!) 106 (!) 105 (!) 103 (!) 106  Resp: '18 18 18 18  ' Temp: 98.1 F (36.7 C) 99.4 F (37.4 C) 99 F (37.2 C) 98.5 F (36.9 C)  TempSrc: Oral Oral Oral Oral  SpO2: 96%  94% 96% 94%  Weight:    81.6 kg  Height:       General: alert, cooperative and no distress Lochia: appropriate Uterine Fundus: firm Incision: Prevena intact DVT Evaluation: No evidence of DVT seen on physical exam. Labs: Lab Results  Component Value Date   WBC 15.4 (H) 10/06/2019   HGB 8.2 (L) 10/06/2019   HCT 25.5 (L) 10/06/2019   MCV 89.2 10/06/2019   PLT 313 10/06/2019   CMP Latest Ref Rng & Units 10/06/2019  Glucose 70 - 99 mg/dL 65(L)  BUN 6 - 20 mg/dL 9  Creatinine 0.44 - 1.00 mg/dL 1.20(H)  Sodium 135 - 145 mmol/L 133(L)  Potassium 3.5 - 5.1 mmol/L 3.9  Chloride 98 - 111 mmol/L 108  CO2 22 - 32 mmol/L 18(L)  Calcium 8.9 - 10.3 mg/dL 7.4(L)  Total Protein 6.5 - 8.1 g/dL 4.7(L)  Total Bilirubin 0.3 - 1.2 mg/dL <0.1(L)  Alkaline Phos 38 - 126 U/L 66  AST 15 - 41 U/L 66(H)  ALT 0 - 44 U/L 43   Edinburgh Score: Edinburgh Postnatal Depression Scale Screening Tool 10/07/2019  I have been able to laugh and see the funny side of things. 0  I have looked forward with enjoyment to things. 0  I have blamed myself unnecessarily when things went wrong. 1  I have been anxious or worried for no good reason. 0  I have felt scared or panicky for no good reason. 0  Things have been getting on top of me. 1  I have been so unhappy that I have had difficulty sleeping. 0  I have felt sad or miserable. 0  I have been so unhappy that I have been crying. 0  The thought of harming myself has occurred to me. 0  Edinburgh Postnatal Depression Scale Total 2     After visit meds:  Allergies as of 10/08/2019      Reactions   Penicillins Hives, Rash   Has patient had a PCN reaction causing immediate rash, facial/tongue/throat swelling, SOB or lightheadedness with hypotension: Yes Has patient had a PCN reaction causing severe rash involving mucus membranes or skin necrosis: No Has patient had a PCN reaction that required hospitalization: Yes Has patient had a PCN reaction occurring within the  last 10 years: No If all of the above answers are "NO", then may proceed with Cephalosporin use.   Benadryl [diphenhydramine] Itching   Doxycycline Itching      Medication List    STOP taking these medications   aspirin EC 81 MG tablet   Dexcom G6 Receiver Devi   Dexcom G6 Sensor Misc   Dexcom G6 Transmitter Misc   Glucagon HCl 1 MG Solr   insulin aspart 100 UNIT/ML injection Commonly known as: NovoLOG   INSULIN SYRINGE .5CC/29G 29G X 1/2" 0.5 ML Misc Commonly known as: Safety Insulin Syringes   metroNIDAZOLE 500 MG tablet Commonly known as: FLAGYL   OmniPod Dash 5 Pack Pods Misc   pantoprazole 20 MG tablet Commonly  known as: Protonix     TAKE these medications   blood glucose meter kit and supplies Kit Dispense based on patient and insurance preference. Use up to four times daily as directed. (FOR ICD-9 250.00, 250.01).   Blood Pressure Kit Devi 1 Device by Does not apply route as needed.   fluticasone 50 MCG/ACT nasal spray Commonly known as: FLONASE Place 1-2 sprays into both nostrils daily for 14 days. What changed:   when to take this  reasons to take this   hydrochlorothiazide 25 MG tablet Commonly known as: HYDRODIURIL Take 1 tablet (25 mg total) by mouth daily.   ibuprofen 600 MG tablet Commonly known as: ADVIL Take 1 tablet (600 mg total) by mouth every 6 (six) hours as needed.   insulin regular 100 units/mL injection Commonly known as: NovoLIN R Inject 0.02 mLs (2 Units total) into the skin 3 (three) times daily before meals.   Iron 325 (65 Fe) MG Tabs Take 1 tablet (325 mg total) by mouth every other day.   Levemir FlexTouch 100 UNIT/ML FlexPen Generic drug: insulin detemir Inject 8 Units into the skin daily.   NIFEdipine 30 MG 24 hr tablet Commonly known as: ADALAT CC Take 1 tablet (30 mg total) by mouth 2 (two) times daily.   oxyCODONE 5 MG immediate release tablet Commonly known as: Oxy IR/ROXICODONE Take 1 tablet (5 mg total)  by mouth every 4 (four) hours as needed for moderate pain.   PrePLUS 27-1 MG Tabs Take 1 tablet by mouth daily.        Discharge home in stable condition Infant Feeding: No evidence of DVT seen on physical exam. Infant Disposition:NICU Discharge instruction: per After Visit Summary and Postpartum booklet. Activity: Advance as tolerated. Pelvic rest for 6 weeks.  Diet: carb modified diet Future Appointments: Future Appointments  Date Time Provider [place]  10/13/2019 11:00 AM [facility] NURSE [facility]  11/03/2019 11:00 AM Donnamae Jude, MD [facility]   Follow up Visit:  [place] [facility] [place] [facility] Follow up.   Specialty: Obstetrics and Gynecology Why: As scheduled on 10/13/19 Contact information: [place] [phone]               Please schedule this patient for a In person postpartum visit in 4 weeks with the following provider: MD. Additional Postpartum F/U:Incision check 1 week  High risk pregnancy complicated by:  J2EQ, cHTN Delivery mode:  C-Section, Low Transverse  Anticipated Birth Control:  Unsure   10/08/2019 Mora Bellman, MD

## 2019-10-06 NOTE — Progress Notes (Signed)
Patient screened out for psychosocial assessment since none of the following apply:  Psychosocial stressors documented in mother or baby's chart  Gestation less than 32 weeks  Code at delivery   Infant with anomalies Please contact the Clinical Social Worker if specific needs arise, by MOB's request, or if MOB scores greater than 9/yes to question 10 on Edinburgh Postpartum Depression Screen.  Skylor Schnapp, LCSW Clinical Social Worker [facility] Cell#: [phone]     

## 2019-10-06 NOTE — Transfer of Care (Signed)
Immediate Anesthesia Transfer of Care Note  Patient: Ashley Freeman  Procedure(s) Performed: CESAREAN SECTION (N/A )  Patient Location: PACU  Anesthesia Type:Spinal  Level of Consciousness: awake, alert  and oriented  Airway & Oxygen Therapy: Patient Spontanous Breathing  Post-op Assessment: Report given to RN and Post -op Vital signs reviewed and stable  Post vital signs: Reviewed and stable  Last Vitals:  Vitals Value Taken Time  BP 124/80 10/06/19 0046  Temp 36.5 C 10/06/19 0048  Pulse 87 10/06/19 0048  Resp 13 10/06/19 0048  SpO2 90 % 10/06/19 0048  Vitals shown include unvalidated device data.  Last Pain:  Vitals:   10/05/19 2130  TempSrc: Oral  PainSc:       Patients Stated Pain Goal: 4 (09/32/35 5732)  Complications: No apparent anesthesia complications

## 2019-10-06 NOTE — Anesthesia Postprocedure Evaluation (Signed)
Anesthesia Post Note  Patient: Ashley Freeman  Procedure(s) Performed: CESAREAN SECTION (N/A )     Patient location during evaluation: PACU Anesthesia Type: Spinal Level of consciousness: oriented and awake and alert Pain management: pain level controlled Vital Signs Assessment: post-procedure vital signs reviewed and stable Respiratory status: spontaneous breathing and respiratory function stable Cardiovascular status: blood pressure returned to baseline and stable Postop Assessment: no headache, no backache, no apparent nausea or vomiting, patient able to bend at knees and spinal receding Anesthetic complications: no Comments: Hb drop from 8.4 to 7.8, decision to hold off on transfusion given tenuous pulmonary status currently.    Last Vitals:  Vitals:   10/06/19 0120 10/06/19 0125  BP:    Pulse: 87 86  Resp: (!) 23 15  Temp:    SpO2: 94% 96%    Last Pain:  Vitals:   10/05/19 2130  TempSrc: Oral  PainSc:    Pain Goal: Patients Stated Pain Goal: 4 (10/04/19 2041)  LLE Motor Response: No movement due to regional block (10/06/19 0100) LLE Sensation: Increased (10/06/19 0100) RLE Motor Response: Purposeful movement (10/06/19 0100) RLE Sensation: Increased (10/06/19 0100)     Epidural/Spinal Function Cutaneous sensation: Able to Discern Pressure (10/06/19 0117), Patient able to flex knees: Yes (10/06/19 0117), Patient able to lift hips off bed: No (10/06/19 0117), Back pain beyond tenderness at insertion site: No (10/06/19 0117), Progressively worsening motor and/or sensory loss: No (10/06/19 0117), Bowel and/or bladder incontinence post epidural: No (10/06/19 0117)  Pervis Hocking

## 2019-10-06 NOTE — Progress Notes (Signed)
CBG 116 at 2107, no insulin needed

## 2019-10-06 NOTE — Progress Notes (Signed)
LOVENOX CONSULT NOTE - INITIAL  Pharmacy Consult for LOVENOX Indication: VTE ppx  Allergies  Allergen Reactions  . Penicillins Hives and Rash    Has patient had a PCN reaction causing immediate rash, facial/tongue/throat swelling, SOB or lightheadedness with hypotension: Yes Has patient had a PCN reaction causing severe rash involving mucus membranes or skin necrosis: No Has patient had a PCN reaction that required hospitalization: Yes Has patient had a PCN reaction occurring within the last 10 years: No If all of the above answers are "NO", then may proceed with Cephalosporin use.  . Benadryl [Diphenhydramine] Itching  . Doxycycline Itching    Patient Measurements: Height: 5\' 3"  (160 cm) Weight: 85.7 kg (189 lb) IBW/kg (Calculated) : 52.4   Vital Signs: Temp: 97.6 F (36.4 C) (06/08 0202) Temp Source: Oral (06/08 0202) BP: 120/84 (06/08 0202) Pulse Rate: 88 (06/08 0202)  Labs: Recent Labs    10/05/19 0433 10/05/19 0433 10/05/19 1217 10/05/19 1821 10/06/19 0053  WBC 17.2*   < > 17.9* 15.7* 13.2*  HGB 8.1*   < > 8.3* 8.4* 7.8*  PLT 319   < > 326 332 303  CREATININE 1.28*  --  1.24* 1.24*  --    < > = values in this interval not displayed.    Assessment: 29 y.o. female recently delivered via c-section. Estimated CrCl=79ml/min For CrCl >50 mL/minute: No dose adjustment necessary.    Plan:  Will dose lovenox at 40mg  (0.47mg /kg) subcutaneous q 24hrs starting 10/06/19 @ 2200. Pharmacy will continue to monitor Scr, Plts, and H/H.  Wyline Mood 10/06/2019,2:48 AM

## 2019-10-06 NOTE — Lactation Note (Signed)
Lactation Consultation Note  Patient Name: Ashley Freeman [id number] Date: 10/06/2019  Per RN, mother is under Mg and has declined any services related to lactation at this point.   Ashley Freeman 10/06/2019, 8:38 PM

## 2019-10-06 NOTE — Progress Notes (Signed)
Subjective: Postpartum Day 1: Cesarean Delivery Patient reports feeling well without HA, visual changes, RUQ/epigastric pain, nausea or emesis.    Objective: Vital signs in last 24 hours: Temp:  [97.5 F (36.4 C)-98.5 F (36.9 C)] 97.6 F (36.4 C) (06/08 0801) Pulse Rate:  [86-100] 91 (06/08 0801) Resp:  [13-34] 16 (06/08 0801) BP: (117-141)/(72-95) 120/80 (06/08 0801) SpO2:  [89 %-98 %] 97 % (06/08 0900)  Physical Exam:  General: alert, cooperative and no distress Lochia: appropriate Uterine Fundus: firm Incision: Prevena pump in place DVT Evaluation: No evidence of DVT seen on physical exam.  Recent Labs    10/06/19 0053 10/06/19 0441  HGB 7.8* 8.2*  HCT 24.3* 25.5*    Assessment/Plan: Status post Cesarean section. Doing well postoperatively.  Magnesium sulfate for 24 hours for seizure prophylaxis Continue monitoring BP Diabetes management consult Continue current care.  Ashley Freeman 10/06/2019, 9:47 AM

## 2019-10-06 NOTE — Lactation Note (Signed)
This note was copied from a baby's chart. Lactation Consultation Note  Patient Name: Ashley Freeman [id number] Date: 10/06/2019  P1, 4 hour LPTI, infant in NICU. Mom with hx: CHTN, Pre-eclampsia, HELLP syndrome and A1 DM LC entered room, mom asleep at this time. Per RN, will give mom DEBP in morning mom is tired and prefers to wait to pump in morning.    Maternal Data    Feeding    LATCH Score                   Interventions    Lactation Tools Discussed/Used     Consult Status      Vicente Serene 10/06/2019, 4:18 AM

## 2019-10-06 NOTE — Progress Notes (Addendum)
Inpatient Diabetes Program Recommendations  AACE/ADA: New Consensus Statement on Inpatient Glycemic Control (2015)  Target Ranges:  Prepandial:   less than 140 mg/dL      Peak postprandial:   less than 180 mg/dL (1-2 hours)      Critically ill patients:  140 - 180 mg/dL   Lab Results  Component Value Date   GLUCAP 124 (H) 10/04/2019   HGBA1C 6.8 (H) 09/27/2019    Review of Glycemic Control Results for AKEIA, PEROT (MRN [id number]) as of 10/06/2019 09:18  Ref. Range 10/06/2019 04:41  Glucose Latest Ref Range: 70 - 99 mg/dL 65 (L)   Diabetes history: Type 1 DM Outpatient Diabetes medications: Omnipodrates in Pregnancy Basal insulin 0000-08000.60units/hour 0801-00000.550units/hour Total daily basal insulin:13.6units/24 hours Carb Coverage 0000-11301:141 unit for every 14grams of carbohydrates 1130-22001:121 unit for every 12 grams of carbohydrates 2200-00001:141 unit for every 14grams of carbohydrates Insulin Sensitivity 1:601 unit drops blood glucose 60mg /dl Target Glucose Goals 0000-0000100 mg/dl Current orders for Inpatient glycemic control:IV insulin BMZx2   Attempted to speak with patient. Question appropriateness and safety of insulin rates at this time. Patient nods in agreement to use subcutaneous insulin injections at this time.    Consider Levemir 5 units to be given 2 hours prior to discontinuation of IV insulin, then QD to follow as well as Novolog 0-6 units Q4H.  Will continue to follow.   Thanks, Bronson Curb, MSN, RNC-OB Diabetes Coordinator [phone] (8a-5p)

## 2019-10-06 NOTE — Lactation Note (Signed)
This note was copied from a baby's chart. Lactation Consultation Note  Patient Name: Ashley Freeman [id number] Date: 10/06/2019 Reason for consult: Initial assessment;NICU baby  [phone] - 0820 - LC attempted to conduct an initial lactation consult with Ashley Freeman whose baby is now 37 hours old in the NICU. Feeding plan in Epic states formula; however, lactation order made, and RN states that Ashley Freeman plans to pump using her personal pump. RN states that she declined a DEBP.  I entered the room to introduce myself and gather some information on her intentionsregarding a pump. Ashley Freeman began to get sick while in room. I stayed in to assist and called RN for assistance. I stated that I would return later in the day to talk more if she felt up for it.    Consult Status Consult Status: Follow-up Date: 10/06/19 Follow-up type: In-patient    Ashley Freeman 10/06/2019, 9:27 AM

## 2019-10-06 NOTE — Op Note (Addendum)
Operative Note   SURGERY DATE: 10/06/2019  PRE-OP DIAGNOSIS:  *Pregnancy at 32 weeks *Pre-eclampsia with severe features superimposed on chronic hypertension *HELLP syndrome *Proteinuria complicating pregnancy *Elevated serum creatinine *Type I DM  POST-OP DIAGNOSIS:  *Same *Delivered  PROCEDURE: primary low transverse cesarean section via pfannenstiel skin incision with double layer uterine closure and Prevena incision device placement  SURGEON: Surgeon(s) and Role:    * Zakhia Seres, Eduard Clos, MD - Primary    *Sparacino, Hailey, DO - OB Fellow  ASSISTANT:     Ian Bushman, MD PGY-2 - Resident  ANESTHESIA: spinal  ESTIMATED BLOOD LOSS:  347 mL  DRAINS: 250 mL UOP via indwelling foley  TOTAL IV FLUIDS: 300 mL crystalloid  VTE PROPHYLAXIS: SCDs to bilateral lower extremities  ANTIBIOTICS: Two grams of Cefazolin were given, within 1 hour of skin incision  SPECIMENS: Placenta to pathology, Arterial and Venous cord gas pending  COMPLICATIONS: None immediate  INDICATIONS: Worsening HELLP with new onset bibasilar crackles on exam, needing nasal cannula O2 to maintain oxygen saturations, remote from delivery.  FINDINGS: No intra-abdominal adhesions were noted. Grossly normal uterus, tubes and ovaries. Clear amniotic fluid, cephalic female infant, weight 1500 gm, APGARs 5/9, intact placenta. Patient's skin and subcutaneous tissue was very edematous of clear, serous fluid.   PROCEDURE IN DETAIL: The patient was taken to the operating room where anesthesia was administered and normal fetal heart tones were confirmed. She was then prepped and draped in the normal fashion in the dorsal supine position with a leftward tilt.  After a time out was performed, a pfannensteil skin incision was made with the scalpel and carried through to the underlying layer of fascia. The fascia was then incised at the midline and this incision was extended laterally with the mayo scissors. Attention was turned  to the superior aspect of the fascial incision which was grasped with the kocher clamps x 2, tented up and the rectus muscles were dissected off bluntly and sharply. In a similar fashion the inferior aspect of the fascial incision was grasped with the kocher clamps, tented up and the rectus muscles dissected off with the mayo scissors. The rectus muscles were then separated in the midline and the peritoneum was entered bluntly. The Alexis retractor and bladder blade were inserted and the vesicouterine peritoneum was identified.  A low transverse hysterotomy was made with the scalpel until the endometrial cavity was breached and the amniotic sac ruptured with the Allis clamp, yielding clear amniotic fluid. This incision was extended bluntly and the infant's head, shoulders and body were delivered atraumatically.The cord was clamped x 2 and cut, and the infant was handed to the awaiting pediatricians.  The placenta was then gradually expressed from the uterus and then the uterus was exteriorized and cleared of all clots and debris. The hysterotomy was repaired with a running suture of 0 Monocryl. A second imbricating layer of 0 Monocryl suture was then placed. A figure-of-eight sutures of 0 Monocryl were added to achieve excellent hemostasis.   The uterus and adnexa were then returned to the abdomen, and the hysterotomy and all operative sites were reinspected and excellent hemostasis was noted after irrigation and suction of the abdomen with warm saline.  The peritoneum was closed with a running stitch of 3-0 Vicryl. The fascia was reapproximated with 0 Vicryl in a simple running fashion bilaterally. The subcutaneous layer was then reapproximated with interrupted sutures of 0 plain gut, and the skin was then closed with 4-0 Vicryl, in a subcuticular fashion.  Prevena incision device was placed without issue and the battery pack indicator light was green and no suction noise at the end of placement.   The  patient  tolerated the procedure well. Sponge, lap, needle, and instrument counts were correct x 2. The patient was transferred to the recovery room awake, alert and breathing independently in stable condition.  Merilyn Baba, DO OB Fellow [facility])   Agree with above. I was present and scrubbed for the entire procedure.   Durene Romans MD Attending [facility] [employer] [facility] [employer] [facility] [employer])

## 2019-10-06 NOTE — Discharge Instructions (Signed)
Postpartum Care After Cesarean Delivery This sheet gives you information about how to care for yourself from the time you deliver your baby to up to 6-12 weeks after delivery (postpartum period). Your health care provider may also give you more specific instructions. If you have problems or questions, contact your health care provider. Follow these instructions at home: Medicines  Take over-the-counter and prescription medicines only as told by your health care provider.  If you were prescribed an antibiotic medicine, take it as told by your health care provider. Do not stop taking the antibiotic even if you start to feel better.  Ask your health care provider if the medicine prescribed to you: ? Requires you to avoid driving or using heavy machinery. ? Can cause constipation. You may need to take actions to prevent or treat constipation, such as:  Drink enough fluid to keep your urine pale yellow.  Take over-the-counter or prescription medicines.  Eat foods that are high in fiber, such as beans, whole grains, and fresh fruits and vegetables.  Limit foods that are high in fat and processed sugars, such as fried or sweet foods. Activity  Gradually return to your normal activities as told by your health care provider.  Avoid activities that take a lot of effort and energy (are strenuous) until approved by your health care provider. Walking at a slow to moderate pace is usually safe. Ask your health care provider what activities are safe for you. ? Do not lift anything that is heavier than your baby or 10 lb (4.5 kg) as told by your health care provider. ? Do not vacuum, climb stairs, or drive a car for as long as told by your health care provider.  If possible, have someone help you at home until you are able to do your usual activities yourself.  Rest as much as possible. Try to rest or take naps while your baby is sleeping. Vaginal bleeding  It is normal to have vaginal bleeding  (lochia) after delivery. Wear a sanitary pad to absorb vaginal bleeding and discharge. ? During the first week after delivery, the amount and appearance of lochia is often similar to a menstrual period. ? Over the next few weeks, it will gradually decrease to a dry, yellow-brown discharge. ? For most women, lochia stops completely by 4-6 weeks after delivery. Vaginal bleeding can vary from woman to woman.  Change your sanitary pads frequently. Watch for any changes in your flow, such as: ? A sudden increase in volume. ? A change in color. ? Large blood clots.  If you pass a blood clot, save it and call your health care provider to discuss. Do not flush blood clots down the toilet before you get instructions from your health care provider.  Do not use tampons or douches until your health care provider says this is safe.  If you are not breastfeeding, your period should return 6-8 weeks after delivery. If you are breastfeeding, your period may return anytime between 8 weeks after delivery and the time that you stop breastfeeding. Perineal care   If your C-section (Cesarean section) was unplanned, and you were allowed to labor and push before delivery, you may have pain, swelling, and discomfort of the tissue between your vaginal opening and your anus (perineum). You may also have an incision in the tissue (episiotomy) or the tissue may have torn during delivery. Follow these instructions as told by your health care provider: ? Keep your perineum clean and dry as told by   your health care provider. Use medicated pads and pain-relieving sprays and creams as directed. ? If you have an episiotomy or vaginal tear, check the area every day for signs of infection. Check for:  Redness, swelling, or pain.  Fluid or blood.  Warmth.  Pus or a bad smell. ? You may be given a squirt bottle to use instead of wiping to clean the perineum area after you go to the bathroom. As you start healing, you may use  the squirt bottle before wiping yourself. Make sure to wipe gently. ? To relieve pain caused by an episiotomy, vaginal tear, or hemorrhoids, try taking a warm sitz bath 2-3 times a day. A sitz bath is a warm water bath that is taken while you are sitting down. The water should only come up to your hips and should cover your buttocks. Breast care  Within the first few days after delivery, your breasts may feel heavy, full, and uncomfortable (breast engorgement). You may also have milk leaking from your breasts. Your health care provider can suggest ways to help relieve breast discomfort. Breast engorgement should go away within a few days.  If you are breastfeeding: ? Wear a bra that supports your breasts and fits you well. ? Keep your nipples clean and dry. Apply creams and ointments as told by your health care provider. ? You may need to use breast pads to absorb milk leakage. ? You may have uterine contractions every time you breastfeed for several weeks after delivery. Uterine contractions help your uterus return to its normal size. ? If you have any problems with breastfeeding, work with your health care provider or a lactation consultant.  If you are not breastfeeding: ? Avoid touching your breasts as this can make your breasts produce more milk. ? Wear a well-fitting bra and use cold packs to help with swelling. ? Do not squeeze out (express) milk. This causes you to make more milk. Intimacy and sexuality  Ask your health care provider when you can engage in sexual activity. This may depend on your: ? Risk of infection. ? Healing rate. ? Comfort and desire to engage in sexual activity.  You are able to get pregnant after delivery, even if you have not had your period. If desired, talk with your health care provider about methods of family planning or birth control (contraception). Lifestyle  Do not use any products that contain nicotine or tobacco, such as cigarettes, e-cigarettes,  and chewing tobacco. If you need help quitting, ask your health care provider.  Do not drink alcohol, especially if you are breastfeeding. Eating and drinking   Drink enough fluid to keep your urine pale yellow.  Eat high-fiber foods every day. These may help prevent or relieve constipation. High-fiber foods include: ? Whole grain cereals and breads. ? Brown rice. ? Beans. ? Fresh fruits and vegetables.  Take your prenatal vitamins until your postpartum checkup or until your health care provider tells you it is okay to stop. General instructions  Keep all follow-up visits for you and your baby as told by your health care provider. Most women visit their health care provider for a postpartum checkup within the first 3-6 weeks after delivery. Contact a health care provider if you:  Feel unable to cope with the changes that a new baby brings to your life, and these feelings do not go away.  Feel unusually sad or worried.  Have breasts that are painful, hard, or turn red.  Have a fever.    Have trouble holding urine or keeping urine from leaking.  Have little or no interest in activities you used to enjoy.  Have not breastfed at all and you have not had a menstrual period for 12 weeks after delivery.  Have stopped breastfeeding and you have not had a menstrual period for 12 weeks after you stopped breastfeeding.  Have questions about caring for yourself or your baby.  Pass a blood clot from your vagina. Get help right away if you:  Have chest pain.  Have difficulty breathing.  Have sudden, severe leg pain.  Have severe pain or cramping in your abdomen.  Bleed from your vagina so much that you fill more than one sanitary pad in one hour. Bleeding should not be heavier than your heaviest period.  Develop a severe headache.  Faint.  Have blurred vision or spots in your vision.  Have a bad-smelling vaginal discharge.  Have thoughts about hurting yourself or your  baby. If you ever feel like you may hurt yourself or others, or have thoughts about taking your own life, get help right away. You can go to your nearest emergency department or call:  Your local emergency services (911 in the [place]  A suicide crisis helpline, such as the National Suicide Prevention Lifeline at 1-800-273-8255. This is open 24 hours a day. Summary  The period of time from when you deliver your baby to up to 6-12 weeks after delivery is called the postpartum period.  Gradually return to your normal activities as told by your health care provider.  Keep all follow-up visits for you and your baby as told by your health care provider. This information is not intended to replace advice given to you by your health care provider. Make sure you discuss any questions you have with your health care provider. Document Revised: 12/04/2017 Document Reviewed: 12/04/2017 Elsevier Patient Education  2020 Elsevier Inc.  

## 2019-10-07 LAB — GLUCOSE, CAPILLARY: Glucose-Capillary: 162 mg/dL — ABNORMAL HIGH (ref 70–99)

## 2019-10-07 MED ORDER — NIFEDIPINE ER OSMOTIC RELEASE 30 MG PO TB24
30.0000 mg | ORAL_TABLET | Freq: Two times a day (BID) | ORAL | Status: DC
  Administered 2019-10-07 – 2019-10-08 (×3): 30 mg via ORAL
  Filled 2019-10-07 (×3): qty 1

## 2019-10-07 MED ORDER — INSULIN ASPART 100 UNIT/ML ~~LOC~~ SOLN
2.0000 [IU] | Freq: Three times a day (TID) | SUBCUTANEOUS | Status: DC
  Administered 2019-10-07 – 2019-10-08 (×2): 2 [IU] via SUBCUTANEOUS

## 2019-10-07 MED ORDER — INSULIN ASPART 100 UNIT/ML ~~LOC~~ SOLN: 0.0000 [IU] | Freq: Every day | SUBCUTANEOUS | Status: DC

## 2019-10-07 MED ORDER — INSULIN ASPART 100 UNIT/ML ~~LOC~~ SOLN
0.0000 [IU] | Freq: Three times a day (TID) | SUBCUTANEOUS | Status: DC
  Administered 2019-10-07: 1 [IU] via SUBCUTANEOUS
  Administered 2019-10-08: 2 [IU] via SUBCUTANEOUS

## 2019-10-07 MED ORDER — INSULIN DETEMIR 100 UNIT/ML ~~LOC~~ SOLN
8.0000 [IU] | Freq: Every day | SUBCUTANEOUS | Status: DC
  Administered 2019-10-07: 8 [IU] via SUBCUTANEOUS
  Filled 2019-10-07 (×2): qty 0.08

## 2019-10-07 NOTE — Progress Notes (Addendum)
Inpatient Diabetes Program Recommendations  AACE/ADA: New Consensus Statement on Inpatient Glycemic Control (2015)  Target Ranges:  Prepandial:   less than 140 mg/dL      Peak postprandial:   less than 180 mg/dL (1-2 hours)      Critically ill patients:  140 - 180 mg/dL   Lab Results  Component Value Date   GLUCAP 124 (H) 10/04/2019   HGBA1C 6.8 (H) 09/27/2019      Diabetes history:Type 1 DM Outpatient Diabetes medications: Omnipodrates in Pregnancy Basal insulin 0000-08000.60units/hour 0801-00000.550units/hour Total daily basal insulin:13.6units/24 hours Carb Coverage 0000-11301:141 unit for every 14grams of carbohydrates 1130-22001:121 unit for every 12 grams of carbohydrates 2200-00001:141 unit for every 14grams of carbohydrates Insulin Sensitivity 1:601 unit drops blood glucose 60mg /dl Target Glucose Goals 0000-0000100 mg/dl Current orders for Inpatient glycemic control:IV insulin BMZx2  Inpatient Diabetes Program Recommendations:    Consider: - Slightly increasing Levemir to 8 units QD  - Changing correction to Novolog 0-6 units TID  - Adding Novolog 2 units TID (assuming patient is consuming >50% of meal).   Thanks, Bronson Curb, MSN, RNC-OB Diabetes Coordinator [phone] (8a-5p)

## 2019-10-07 NOTE — Progress Notes (Signed)
CBG 97 at 2148, no insulin administered

## 2019-10-07 NOTE — Progress Notes (Signed)
X 2 assist, Rn tried to ambulate pt, pt very drowsy and sleepy and felt nauseated. Was able to  Get pt to sit by the side of the bed and dangled her lower extremities. Pt felt very tired and laid back in the bed. We will attempt again before end of this shift after pts mag is turned off.

## 2019-10-07 NOTE — Progress Notes (Signed)
Subjective: Postpartum Day 2: Cesarean Delivery Patient reports feeling well without HA, visual changes, RUQ/epigastric pain, nausea or emesis. She is ambulating and voiding. She is tolerating a regular diet.    Objective: Vital signs in last 24 hours: Temp:  [97.7 F (36.5 C)-98.3 F (36.8 C)] 97.7 F (36.5 C) (06/09 0503) Pulse Rate:  [84-91] 91 (06/09 0503) Resp:  [17-18] 18 (06/09 0503) BP: (121-133)/(69-83) 121/69 (06/09 0503) SpO2:  [87 %-100 %] 97 % (06/09 0503) Weight:  [83.6 kg] 83.6 kg (06/09 0500)  Physical Exam:  General: alert, cooperative and no distress Lochia: appropriate Uterine Fundus: firm Incision: dressing clean dry and intact. Prevena pump in place DVT Evaluation: No evidence of DVT seen on physical exam.  Recent Labs    10/06/19 0053 10/06/19 0441  HGB 7.8* 8.2*  HCT 24.3* 25.5*    Assessment/Plan: Status post Cesarean section. Doing well postoperatively.  Patient completed magnesium sulfate for seizure prophylaxis BP stable Discharge planning tomorrow Continue current postpartum care.  Ashley Freeman 10/07/2019, 10:18 AM

## 2019-10-07 NOTE — Lactation Note (Signed)
This note was copied from a baby's chart. Lactation Consultation Note  Patient Name: Girl Yee Gangi [id number] Date: 10/07/2019   Entered room to inititiate pumping and mom sleeping.  Unable to get her awake.  Set everything up by patients bedside and let RN know.  Maternal Data    Feeding    LATCH Score                   Interventions    Lactation Tools Discussed/Used     Consult Status      Anvi Mangal Thompson Caul 10/07/2019, 2:22 PM

## 2019-10-07 NOTE — Lactation Note (Signed)
This note was copied from a baby's chart. Lactation Consultation Note  Patient Name: Girl Rhianne Soman KIYJG'[id number] Date: 10/07/2019   Baby girl Skyl'r now 85 hours old and mom has not begun pumping.  Entered room and mom eating lunch and reported that she had planned to use her own pump but that the battery was dead so she has been unable to pump.  Mom reports she would like to use her own pump since that's what she will be using at home. Explained to mom that it was better to establish a healthy milk supply with the hospital pump.   Mom asked if I would come back after she eats dinner.  Took DEBP and all she needs in her room to pump and said I would be back in about 15-20 minutes.    Maternal Data    Feeding    LATCH Score                   Interventions    Lactation Tools Discussed/Used     Consult Status      Karenna Romanoff Thompson Caul 10/07/2019, 2:17 PM

## 2019-10-07 NOTE — Progress Notes (Signed)
CBG 118 at 0038, no insulin administered

## 2019-10-08 ENCOUNTER — Encounter: Payer: BLUE CROSS/BLUE SHIELD | Admitting: Obstetrics and Gynecology

## 2019-10-08 LAB — SURGICAL PATHOLOGY

## 2019-10-08 MED ORDER — HYDROCHLOROTHIAZIDE 25 MG PO TABS
25.0000 mg | ORAL_TABLET | Freq: Every day | ORAL | Status: DC
  Administered 2019-10-08: 25 mg via ORAL
  Filled 2019-10-08: qty 1

## 2019-10-08 MED ORDER — FUROSEMIDE 10 MG/ML IJ SOLN
20.0000 mg | Freq: Once | INTRAMUSCULAR | Status: AC
  Administered 2019-10-08: 20 mg via INTRAVENOUS
  Filled 2019-10-08: qty 2

## 2019-10-08 MED ORDER — INSULIN REGULAR HUMAN 100 UNIT/ML IJ SOLN: 2.0000 [IU] | Freq: Three times a day (TID) | INTRAMUSCULAR | 11 refills | Status: DC

## 2019-10-08 MED ORDER — NIFEDIPINE ER 30 MG PO TB24: 30.0000 mg | ORAL_TABLET | Freq: Two times a day (BID) | ORAL | 3 refills | Status: DC

## 2019-10-08 MED ORDER — IBUPROFEN 600 MG PO TABS
600.0000 mg | ORAL_TABLET | Freq: Four times a day (QID) | ORAL | 3 refills | Status: DC | PRN
Start: 2019-10-08 — End: 2019-10-20

## 2019-10-08 MED ORDER — OXYCODONE HCL 5 MG PO TABS: 5.0000 mg | ORAL_TABLET | ORAL | 0 refills | Status: DC | PRN

## 2019-10-08 MED ORDER — HYDROCHLOROTHIAZIDE 25 MG PO TABS: 25.0000 mg | ORAL_TABLET | Freq: Every day | ORAL | 3 refills | Status: DC

## 2019-10-08 MED ORDER — LEVEMIR FLEXTOUCH 100 UNIT/ML ~~LOC~~ SOPN: 8.0000 [IU] | PEN_INJECTOR | Freq: Every day | SUBCUTANEOUS | 11 refills | Status: DC

## 2019-10-08 MED ORDER — HYDROCHLOROTHIAZIDE 25 MG PO TABS: 25.0000 mg | ORAL_TABLET | Freq: Every day | ORAL | Status: DC

## 2019-10-08 NOTE — Progress Notes (Signed)
D/c teaching complete  Pt out with mom

## 2019-10-08 NOTE — Progress Notes (Signed)
Inpatient Diabetes Program Recommendations  AACE/ADA: New Consensus Statement on Inpatient Glycemic Control (2015)  Target Ranges:  Prepandial:   less than 140 mg/dL      Peak postprandial:   less than 180 mg/dL (1-2 hours)      Critically ill patients:  140 - 180 mg/dL   Lab Results  Component Value Date   GLUCAP 162 (H) 10/07/2019   HGBA1C 6.8 (H) 09/27/2019     Diabetes history:Type 1 DM Outpatient Diabetes medications: Omnipodrates in Pregnancy Basal insulin 0000-08000.60units/hour 0801-00000.550units/hour Total daily basal insulin:13.6units/24 hours Carb Coverage 0000-11301:141 unit for every 14grams of carbohydrates 1130-22001:121 unit for every 12 grams of carbohydrates 2200-00001:141 unit for every 14grams of carbohydrates Insulin Sensitivity 1:601 unit drops blood glucose 60mg /dl Target Glucose Goals 0000-0000100 mg/dl Current orders for Inpatient glycemic control:IV insulin BMZx2  Inpatient Diabetes Program Recommendations:     Spoke with patient regarding this FSBG of 233 mg/dL. Per patient, "I snacked all night". Patient consumed > 50 grams of CHO and did not receive coverage for the carbohydrates. Counseled patient when she goes home on discharge orders, insulin, and need to cover CHOs. Patient expresses understanding and plans to make follow up appointment with Dr Kelton Pillar. Encouraged RN to continue with current orders.   Thanks, Bronson Curb, MSN, RNC-OB Diabetes Coordinator [phone] (8a-5p)

## 2019-10-09 ENCOUNTER — Ambulatory Visit: Payer: BLUE CROSS/BLUE SHIELD | Admitting: Internal Medicine

## 2019-10-09 LAB — TYPE AND SCREEN
ABO/RH(D): A POS
Antibody Screen: NEGATIVE
Unit division: 0
Unit division: 0

## 2019-10-09 LAB — BPAM RBC
Blood Product Expiration Date: 202106122359
Blood Product Expiration Date: 202106182359
ISSUE DATE / TIME: 202106072339
ISSUE DATE / TIME: 202106072339
Unit Type and Rh: 6200
Unit Type and Rh: 6200

## 2019-10-12 ENCOUNTER — Ambulatory Visit: Payer: BLUE CROSS/BLUE SHIELD

## 2019-10-13 ENCOUNTER — Ambulatory Visit (INDEPENDENT_AMBULATORY_CARE_PROVIDER_SITE_OTHER): Payer: BLUE CROSS/BLUE SHIELD | Admitting: *Deleted

## 2019-10-13 ENCOUNTER — Other Ambulatory Visit: Payer: Self-pay

## 2019-10-13 VITALS — BP 153/92

## 2019-10-13 DIAGNOSIS — O1410 Severe pre-eclampsia, unspecified trimester: Secondary | ICD-10-CM

## 2019-10-13 DIAGNOSIS — O1093 Unspecified pre-existing hypertension complicating the puerperium: Secondary | ICD-10-CM

## 2019-10-13 DIAGNOSIS — Z98891 History of uterine scar from previous surgery: Secondary | ICD-10-CM

## 2019-10-13 MED ORDER — NIFEDIPINE ER OSMOTIC RELEASE 60 MG PO TB24
60.0000 mg | ORAL_TABLET | Freq: Two times a day (BID) | ORAL | 1 refills | Status: DC
Start: 2019-10-13 — End: 2019-11-03

## 2019-10-13 MED ORDER — HYDROXYZINE HCL 25 MG PO TABS
25.0000 mg | ORAL_TABLET | Freq: Four times a day (QID) | ORAL | 0 refills | Status: DC | PRN
Start: 2019-10-13 — End: 2019-11-03

## 2019-10-13 NOTE — Progress Notes (Signed)
Subjective:  Ashley Freeman is a 29 y.o. female here for BP check and incision check post status cesarean section on 10/06/2019.  Hypertension ROS: taking medications as instructed, no medication side effects noted, no TIA's, no chest pain on exertion, no dyspnea on exertion and no swelling of ankles.    Objective:  BP (!) 153/92  162/97 Appearance alert, well appearing, and in no distress. General exam BP noted to be elevated in office today.  Incision: prevena removed in office, n drainage, no redness noted. Steri strips intact. Wound care instructions given.     Assessment:   Blood Pressure needs improvement.   Plan:  Dr. Harolyn Rutherford made aware of BP's in office today, too up medication to 60mg  BID. [name] Pt. To follow up in one week for BP check.

## 2019-10-13 NOTE — Progress Notes (Signed)
Patient was assessed and managed by nursing staff during this encounter. I have reviewed the chart and agree with the documentation and plan. I have also made any necessary editorial changes.  Verita Schneiders, MD 10/13/2019 12:13 PM

## 2019-10-14 ENCOUNTER — Telehealth: Payer: Self-pay | Admitting: Nutrition

## 2019-10-14 ENCOUNTER — Encounter: Payer: BLUE CROSS/BLUE SHIELD | Admitting: Nutrition

## 2019-10-14 NOTE — Telephone Encounter (Signed)
Patient came in to the office, and said that Dr. Kelton Pillar asked that we download her pump for her visit next week.  Said they had difficulty getting data from in last time.   Pump was downloaded at her office and download put on her desk.  Says is feeling well, with no complaints.

## 2019-10-16 ENCOUNTER — Ambulatory Visit: Payer: Self-pay

## 2019-10-16 NOTE — Lactation Note (Signed)
This note was copied from a baby's chart. Lactation Consultation Note  Patient Name: Ashley Freeman [id number] Date: 10/16/2019 Reason for consult: Follow-up assessment;Primapara;1st time breastfeeding;NICU baby;Preterm <34wks;Infant < 6lbs  1040 - 5498 - RN assigned to room mentioned that Ashley Freeman might benefit from lactation follow up during my rounds today. I checked in on her. She states that she has not been pumping. We discussed her plans and feeding preferences. Ashley Freeman states that when she was expecting, her goal was to primarily breast feed. She states that when baby was born early, she was a bit overwhelmed. As a result she did not begin to pump until day 3 postpartum. Since that time, she reports inconsistent pumping with her last pump on 6/16.  I gently asked her for her goals moving forward, particularly when baby is due to transition off of donor milk. She remains interested in providing her breast milk and even latching baby when the time comes.She is aware that it may be difficulty to recover a full milk supply, and she is okay with supplementation with formula or donor breast milk. She is open to pumping. I set up a DEBP for her and reviewed the settings and how to clean pump equipment. A size 24 flange appeared to work well for her.  I educated on the expectations for recovering her milk at day 66 and stated that we would essentially start from the beginning with the initiation phase on the breast pump. I asked her to pump for the next two days 8 times a day (or 2-3 by day and 3-4 at night), including at night. I encouraged her to try not to focus on what she pumps out at this time as it may take a few days to see results.    We discussed her pumping situation at home. She is rotating overnight stays with baby "Sky'lr" with baby's father. She reports that they are not romantically together, but they are good friends and are both working well  together to give baby continuous care. At home, she has a personal pump, but she could not recall what she had. She does have Arthur, and I offered to put in a referral for a DEBP. She agreed and stated that she would like that.  I suggested that lactation follow up in a few days, such as Sunday 6/20, to follow up. She states that she usually comes to visit between 1000 and 1500. I stated that we would try to follow up then.  I shared my visit details with RN assigned tot he room after this encounter.   Maternal Data Does the patient have breastfeeding experience prior to this delivery?: No  Feeding Feeding Type: Donor Breast Milk  Interventions Interventions: Breast feeding basics reviewed;DEBP  Lactation Tools Discussed/Used Pump Review: Setup, frequency, and cleaning;Milk Storage Initiated by:: hl Date initiated:: 10/16/19   Consult Status Consult Status: Follow-up Date: 10/18/19 Follow-up type: In-patient    Lenore Manner 10/16/2019, 11:37 AM

## 2019-10-20 ENCOUNTER — Ambulatory Visit (INDEPENDENT_AMBULATORY_CARE_PROVIDER_SITE_OTHER): Payer: BLUE CROSS/BLUE SHIELD | Admitting: Internal Medicine

## 2019-10-20 ENCOUNTER — Ambulatory Visit: Payer: Self-pay

## 2019-10-20 ENCOUNTER — Encounter: Payer: Self-pay | Admitting: Internal Medicine

## 2019-10-20 ENCOUNTER — Other Ambulatory Visit: Payer: Self-pay

## 2019-10-20 VITALS — Ht 63.0 in

## 2019-10-20 DIAGNOSIS — R809 Proteinuria, unspecified: Secondary | ICD-10-CM | POA: Diagnosis not present

## 2019-10-20 DIAGNOSIS — E1029 Type 1 diabetes mellitus with other diabetic kidney complication: Secondary | ICD-10-CM

## 2019-10-20 MED ORDER — INSULIN ASPART 100 UNIT/ML ~~LOC~~ SOLN: SUBCUTANEOUS | 3 refills | Status: DC

## 2019-10-20 NOTE — Progress Notes (Signed)
Name: Ashley Freeman  Age/ Sex: 29 y.o., female   MRN/ DOB: [id number], [birth year]     PCP: Patient, No Pcp Per   Reason for Endocrinology Evaluation: Type 2 Diabetes Mellitus  Initial Endocrine Consultative Visit: 06/27/18    PATIENT IDENTIFIER: Ashley Freeman is a 29 y.o. female with a past medical history of T1DM with multiple DKA's. The patient has followed with Endocrinology clinic since 06/27/2018 for consultative assistance with management of her diabetes.  DIABETIC HISTORY:  Ashley Freeman was diagnosed with T1DM at age 75. Hx of multiple DKA's due to non-compliance. Her hemoglobin A1c has ranged from  in 11.3% in 2011, peaking at 19.3% in 2015.   SUBJECTIVE:   During the last visit (09/10/2019): A1c 6.4% .  We adjusted pump settings  Today (10/20/2019): Ashley Freeman is here for a follow up on diabetes management . 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 During the visit the pump basal and bolus doses were reviewed including carb/insulin rations and supplemental doses. The clinical list was updated. The glucose meter download was reviewed in detail to determine if the current pump settings are providing the best glycemic control without excessive hypoglycemia.  Pump and meter download:    Pump   Omnipod Settings   Insulin type   NOVOLOG    Basal rate       0000-0800 0.600   0800- 0000 0.550          I:C ratio       0000 12              AIT  4 hrs        Sensitivity       0000  65       Goal       0000  100             Type & Model of Pump: Omnipod Insulin Type: Currently using Novolog.  Body mass index is 31.89 kg/m.  PUMP STATISTICS: Average BG: 173 Average Daily  Carbs (g): 36 Average Total Daily Insulin: 11.7 Average Daily Basal: 5.9 (79 %) Average Daily Bolus: 1.6 (21 %)      CONTINUOUS GLUCOSE MONITORING RECORD INTERPRETATION    Dates of Recording: 6/9-6/22/2021  Sensor description: Dexcom  Results statistics:   CGM use % of time 7  Average and SD 147/43  Time in range  76    %  % Time Above 180 21  % Time above 250 < 1  % Time Below target 2   Glycemic patterns summary: Tight BG's over night , mild hyperglycemia during the day with tight BG's post-bolus  Hyperglycemic episodes  Post-prandial   Hypoglycemic episodes occurred overnight and post-bolus   Overnight periods: trends down     HISTORY:  Past Medical History:  Past Medical History:  Diagnosis Date   Abscess, gluteal, right 08/24/2013   AKI (acute kidney injury) (HCC) 07/26/2014   Anemia 02/19/2012   Bartholin's gland abscess 09/19/2013   BV (bacterial vaginosis) 11/24/2015   Diabetes mellitus type I (HCC) 2001   Diagnosed at age 61 ; Type I   Diarrhea 05/30/2016   DKA (diabetic ketoacidoses) (HCC) 08/19/2013  Also in 2018   Gonorrhea 08/2011   Treated in 09/2011   History of trichomoniasis 05/31/2016   Hyperlipidemia 03/28/2016   Sepsis (HCC) 09/19/2013   Past Surgical History:  Past Surgical History:  Procedure Laterality Date   CESAREAN SECTION N/A 10/05/2019   Procedure: CESAREAN SECTION;  Surgeon: Tidioute Bing, MD;  Location: [facility] ORS;  Service: Obstetrics;  Laterality: N/A;   INCISION AND DRAINAGE ABSCESS Left 09/28/2019   Procedure: INCISION AND DRAINAGE VULVAR ABCESS;  Surgeon: Tilda Burrow, MD;  Location[facility];  Service: Gynecology;  Laterality: Left;   INCISION AND DRAINAGE PERIRECTAL ABSCESS Right 08/18/2013   Procedure: IRRIGATION AND DEBRIDEMENT GLUTEAL ABSCESS;  Surgeon: Axel Filler, MD;  Location: [facility] OR;  Service: General;  Laterality: Right;   INCISION AND DRAINAGE PERIRECTAL ABSCESS Right 09/19/2013   Procedure:  IRRIGATION AND DEBRIDEMENT RIGHT GLUTEAL AND LABIAL ABSCESSES;  Surgeon: Axel Filler, MD;  Location: [facility] OR;  Service: General;  Laterality: Right;   INCISION AND DRAINAGE PERIRECTAL ABSCESS Right 09/24/2013   Procedure: IRRIGATION AND DEBRIDEMENT PERIRECTAL ABSCESS;  Surgeon: Cherylynn Ridges, MD;  Location: [facility] OR;  Service: General;  Laterality: Right;    Social History:  reports that she has never smoked. She has never used smokeless tobacco. She reports previous alcohol use. She reports that she does not use drugs. Family History:  Family History  Problem Relation Age of Onset   Asthma Mother    Carpal tunnel syndrome Mother    Gout Father    Diabetes Paternal Grandmother    Anesthesia problems Neg Hx      HOME MEDICATIONS: Allergies as of 10/20/2019      Reactions   Penicillins Hives, Rash   Has patient had a PCN reaction causing immediate rash, facial/tongue/throat swelling, SOB or lightheadedness with hypotension: Yes Has patient had a PCN reaction causing severe rash involving mucus membranes or skin necrosis: No Has patient had a PCN reaction that required hospitalization: Yes Has patient had a PCN reaction occurring within the last 10 years: No If all of the above answers are "NO", then may proceed with Cephalosporin use.   Benadryl [diphenhydramine] Itching   Doxycycline Itching      Medication List       Accurate as of October 20, 2019  4:25 PM. If you have any questions, ask your nurse or doctor.        STOP taking these medications   fluticasone 50 MCG/ACT nasal spray Commonly known as: FLONASE Stopped by: Scarlette Shorts, MD   ibuprofen 600 MG tablet Commonly known as: ADVIL Stopped by: Scarlette Shorts, MD   insulin regular 100 units/mL injection Commonly known as: NovoLIN R Stopped by: Scarlette Shorts, MD   Levemir FlexTouch 100 UNIT/ML FlexPen Generic drug: insulin detemir Stopped by: Scarlette Shorts, MD     TAKE these  medications   blood glucose meter kit and supplies Kit Dispense based on patient and insurance preference. Use up to four times daily as directed. (FOR ICD-9 250.00, 250.01).   Blood Pressure Kit Devi 1 Device by Does not apply route as needed.   hydrochlorothiazide 25 MG tablet Commonly known as: HYDRODIURIL Take 1 tablet (25 mg total) by mouth daily.   hydrOXYzine 25 MG tablet Commonly known as: ATARAX/VISTARIL Take 1 tablet (25 mg total) by mouth every 6 (six) hours as needed (As Needed for sleep).   Iron 325 (65 Fe) MG Tabs Take 1 tablet (325 mg total) by mouth every  other day.   NIFEdipine 60 MG 24 hr tablet Commonly known as: Procardia XL Take 1 tablet (60 mg total) by mouth 2 (two) times daily. What changed: Another medication with the same name was removed. Continue taking this medication, and follow the directions you see here. Changed by: Scarlette Shorts, MD   NovoLOG 100 UNIT/ML injection Generic drug: insulin aspart Inject into the skin See admin instructions. Use via insulin pump daily.   oxyCODONE 5 MG immediate release tablet Commonly known as: Oxy IR/ROXICODONE Take 1 tablet (5 mg total) by mouth every 4 (four) hours as needed for moderate pain.   PrePLUS 27-1 MG Tabs Take 1 tablet by mouth daily.        OBJECTIVE:   PHYSICAL EXAM: VS:Ht 5\' 3"  (1.6 m)    BMI 31.89 kg/m   Ht 5\' 3"  (1.6 m)    BMI 31.89 kg/m    EXAM: General: Pt appears well and is in NAD  Lungs: Clear with good BS bilat with no rales, rhonchi, or wheezes  Heart: RRR  Abdomen:  Incision is clean, abdomen is soft and non-tender  Extremities: BL LE: 1+  pretibial edema   Mental Status: Judgment, insight: intact Mood and affect: no depression, anxiety, or agitation     DM foot exam: 04/08/2019 The skin of the feet is intact without sores or ulcerations. The pedal pulses are 2+ on right and 2+ on left. The sensation is intact to a screening 5.07, 10 gram monofilament  bilaterally     DATA REVIEWED:  Lab Results  Component Value Date   HGBA1C 6.8 (H) 09/27/2019   HGBA1C 6.4 (A) 08/13/2019   HGBA1C 7.8 (H) 05/20/2019     Lab Results  Component Value Date   MICRALBCREAT 115.3 (H) 04/08/2019    Results for BRYONY, ROUGIER (MRN [id number]) as of 04/08/2019 16:35  Ref. Range 04/08/2019 10:20  MICROALB/CREAT RATIO Latest Ref Range: 0.0 - 30.0 mg/g 115.3 (H)    ASSESSMENT / PLAN / RECOMMENDATIONS:   1) Type 1 Diabetes Mellitus, Optimally controlled, With microalbuminuria - Most recent A1c of 6.8 %. Goal A1c < 6.5%.    - Pt is not entering enough CHO. She is over basalized at this time. Will adjust pump settings as below  - I have again encouraged her to enter all carbs consumed   MEDICATIONS: Novolog through the Omnipod  Pump   Omnipod Settings   Basal rate       0000 0.500              I:C ratio       0000 14          Sensitivity       0000  65      Goal       0000  100    EDUCATION / INSTRUCTIONS:  BG monitoring instructions: Patient is instructed to check her blood sugars 4 times a day, before meals and bedtime   Call [facility] if: BG persistently < 70  I reviewed the Rule of 15 for the treatment of hypoglycemia in detail with the patient. Literature supplied.     F/U in 3 months      Signed electronically by: Lyndle Herrlich, MD  [facility]  [facility] [address] [place], [place] [address] Phone: [phone] FAX: [phone]   CC: Patient, No Pcp Per No address on file Phone: None  Fax: None  Return to  Endocrinology clinic as below: Future Appointments  Date Time Provider Department Center  10/21/2019  3:30 PM [facility] NURSE [facility]  11/03/2019 11:00 AM Reva Bores, MD [facility]  01/20/2020 10:50 AM Saul Dorsi, Konrad Dolores, MD [facility]

## 2019-10-20 NOTE — Telephone Encounter (Signed)
Wow.  Will try to call her back and see what is going on.  She was very specific in what she wanted.  Will let you know what she said.

## 2019-10-20 NOTE — Lactation Note (Signed)
This note was copied from a baby's chart. Lactation Consultation Note  Patient Name: Ashley Freeman [id number] Date: 10/20/2019 Reason for consult: Follow-up assessment;Mother's request;1st time breastfeeding;NICU baby;Preterm <34wks  [id number] - [id number] - I followed up with [name]. I assisted with positioning baby Sky'lr at the right breast in football hold. Baby woke briefly and we attempted to latch. She practiced lick and learn and then fell asleep. I reviewed hand expression with [name]; transitional milk noted. I showed her how to express and finger feed to baby.   Educated on developmental readiness to breast feed and encouraged her to give baby opportunities to explore the breast.  I also encouraged [name] to continue to pump 8 times a day. She states that she has been pumping; it's unclear as to how consistent she has been. She states that she is able to produce about 10 mls/pump. I praised her for her hard work and for being able to produce after a long hiatus.  I encouraged her to keep going. She states that she obtained a DEBP from [facility] yesterday after noting that she was unable to produce as much with her personal pump. I praised her for obtaining that pump and for all the progress she has made over the last week. She stated that "it has to be done."   I encouraged her to call as needed this week; I will attempt to follow up with her again soon.  Maternal Data Has patient been taught Hand Expression?: Yes Does the patient have breastfeeding experience prior to this delivery?: No  Feeding Feeding Type: Breast Milk with Donor Milk  LATCH Score Latch: Too sleepy or reluctant, no latch achieved, no sucking elicited.  Audible Swallowing: None  Type of Nipple: Everted at rest and after stimulation  Comfort (Breast/Nipple): Soft / non-tender  Hold (Positioning): Assistance needed to correctly position infant at breast and  maintain latch.  LATCH Score: 5  Interventions Interventions: Breast feeding basics reviewed;Assisted with latch;Skin to skin;Hand express;Support pillows;Adjust position  Lactation Tools Discussed/Used Pump Review: Setup, frequency, and cleaning;Milk Storage   Consult Status Consult Status: Follow-up Date: 10/24/19 Follow-up type: In-patient    Lenore Manner 10/20/2019, 11:23 AM

## 2019-10-26 ENCOUNTER — Telehealth: Payer: Self-pay | Admitting: Radiology

## 2019-10-26 NOTE — Telephone Encounter (Signed)
Called patient and left message to call [facility] to reschedule missed appt for BP check on 10/27/19

## 2019-11-03 ENCOUNTER — Other Ambulatory Visit: Payer: Self-pay

## 2019-11-03 ENCOUNTER — Encounter (HOSPITAL_COMMUNITY): Payer: Self-pay | Admitting: Obstetrics and Gynecology

## 2019-11-03 ENCOUNTER — Ambulatory Visit (INDEPENDENT_AMBULATORY_CARE_PROVIDER_SITE_OTHER): Payer: BLUE CROSS/BLUE SHIELD | Admitting: Family Medicine

## 2019-11-03 ENCOUNTER — Encounter: Payer: Self-pay | Admitting: Family Medicine

## 2019-11-03 DIAGNOSIS — Z3042 Encounter for surveillance of injectable contraceptive: Secondary | ICD-10-CM

## 2019-11-03 LAB — POCT URINE PREGNANCY: Preg Test, Ur: NEGATIVE

## 2019-11-03 MED ORDER — MEDROXYPROGESTERONE ACETATE 150 MG/ML IM SUSP
150.0000 mg | Freq: Once | INTRAMUSCULAR | Status: AC
  Administered 2019-11-03: 150 mg via INTRAMUSCULAR

## 2019-11-03 NOTE — Progress Notes (Signed)
    [place] Partum Visit Note  Ashley Freeman is a 29 y.o. [phone] female who presents for a postpartum visit. She is 4 weeks postpartum following a cesarean delivery:  I have fully reviewed the prenatal and intrapartum course. The delivery was at 31 gestational weeks.  Anesthesia: epiudal. Postpartum course has been uncomplicated. Baby is doing well. Baby is feeding by bottle. Bleeding small amount. Bowel function is normal. Bladder function is normal. Patient is not sexually active. Contraception method is nothing at this time. Postpartum depression screening: negative.  The following portions of the patient's history were reviewed and updated as appropriate: allergies, current medications, past family history, past medical history, past social history, past surgical history and problem list.  Review of Systems Pertinent items noted in HPI and remainder of comprehensive ROS otherwise negative.    Objective:  Blood pressure 120/78, weight 135 lb 3.2 oz (61.3 kg), unknown if currently breastfeeding.  General:  alert, cooperative and appears stated age  Lungs: normal effort  Heart:  regular rate and rhythm  Abdomen: soft, non-tender; bowel sounds normal; no masses,  no organomegaly incision with steri-strips in place, removed, well healed        Assessment:    Normal postpartum exam. Pap smear not done at today's visit.   Plan:   Essential components of care per ACOG recommendations:  1.  Mood and well being: Patient with negative depression screening today. Reviewed local resources for support.  - Patient does not use tobacco.  - hx of drug use? No    2. Infant care and feeding:  -Patient currently breastmilk feeding? No .  3. Sexuality, contraception and birth spacing - Patient does not want a pregnancy in the next year.  Desired family size is 1 children.  - Reviewed forms of contraception in tiered fashion. Patient desired Depo-Provera today.   - Discussed birth  spacing of 18 months  4. Sleep and fatigue -Encouraged family/partner/community support of 4 hrs of uninterrupted sleep to help with mood and fatigue  5. Physical Recovery  - Discussed patients delivery via c-section and complications were none  - Patient has urinary incontinence? No Patient was referred to pelvic floor PT  - Patient is not safe to resume physical and sexual activity  6.  Health Maintenance - Last pap smear done 11/2018 and was normal.  7. Type 1 DM, Chronic Disease - PCP follow up--referral, has Endocrine. On Omnipod. Has retinal f/u. Discussed need for excellent glycemic control.  Donnamae Jude, MD Center for [employer], [place]

## 2019-11-07 ENCOUNTER — Encounter: Payer: Self-pay | Admitting: Internal Medicine

## 2019-11-14 ENCOUNTER — Ambulatory Visit: Payer: Self-pay

## 2019-11-14 NOTE — Lactation Note (Signed)
This note was copied from a baby's chart. Lactation Consultation Note  Patient Name: Girl Shaquetta Arcos [id number] Date: 11/14/2019    Mom is no longer pumping.  Reports she dried up about 2 weeks after Skly'lr  was born.  Mom reports she still has to turn the pump back into [facility].  Praised mom giving  her colostrum for [employer] during those first few weeks.  Urged her to call lactation if needs anything.  Maternal Data    Feeding Feeding Type: Formula Nipple Type: Dr. Myra Gianotti [facility]  [facility] Score                   Interventions    Lactation Tools Discussed/Used     Consult Status      Leshon Armistead Thompson Caul 11/14/2019, 8:28 PM

## 2019-12-14 ENCOUNTER — Telehealth: Payer: Self-pay | Admitting: Internal Medicine

## 2019-12-14 ENCOUNTER — Other Ambulatory Visit: Payer: Self-pay

## 2019-12-14 NOTE — Telephone Encounter (Signed)
Medication Refill Request  Did you call your pharmacy and request this refill first? Yes  . If patient has not contacted pharmacy first, instruct them to do so for future refills.  . Remind them that contacting the pharmacy for their refill is the quickest method to get the refill.  . Refill policy also stated that it will take anywhere between 24-72 hours to receive the refill.    Name of medication? transmitters  Is this a 76 day supply?   Name and location of pharmacy?  [place] ([place]), [place] - [address] Phone:  [phone]  Fax:  [phone]

## 2019-12-14 NOTE — Telephone Encounter (Signed)
Rx sent 

## 2019-12-16 ENCOUNTER — Other Ambulatory Visit: Payer: Self-pay

## 2019-12-16 ENCOUNTER — Telehealth: Payer: Self-pay | Admitting: Internal Medicine

## 2019-12-16 MED ORDER — DEXCOM G6 TRANSMITTER MISC: 1 refills | Status: DC

## 2019-12-16 NOTE — Telephone Encounter (Signed)
Medication RX Request  Did you call your pharmacy and request this refill first? Yes-2nd request . If patient has not contacted pharmacy first, instruct them to do so for future refills.  . Remind them that contacting the pharmacy for their refill is the quickest method to get the refill.  . Refill policy also stated that it will take anywhere between 24-72 hours to receive the refill.    Name of medication?  Continuous Blood Gluc Transmit (DEXCOM G6 TRANSMITTER) MISC   Is this a 90 day supply? No  Name and location of pharmacy?  [place] ([place]), [place] - [address] Phone:  [phone]  Fax:  [phone]       . Is the request for diabetes test strips? No . If yes, what brand? N/A

## 2019-12-16 NOTE — Telephone Encounter (Signed)
Rx has been sent with Receipt confirmation.

## 2020-01-13 ENCOUNTER — Encounter (HOSPITAL_COMMUNITY): Payer: Self-pay

## 2020-01-13 ENCOUNTER — Other Ambulatory Visit: Payer: Self-pay

## 2020-01-13 ENCOUNTER — Emergency Department (HOSPITAL_COMMUNITY)
Admission: EM | Admit: 2020-01-13 | Discharge: 2020-01-13 | Disposition: A | Discharge status: Home / Self Care | Payer: BLUE CROSS/BLUE SHIELD | Attending: Emergency Medicine | Admitting: Emergency Medicine

## 2020-01-13 DIAGNOSIS — Z79899 Other long term (current) drug therapy: Secondary | ICD-10-CM | POA: Insufficient documentation

## 2020-01-13 DIAGNOSIS — E1110 Type 2 diabetes mellitus with ketoacidosis without coma: Secondary | ICD-10-CM | POA: Insufficient documentation

## 2020-01-13 DIAGNOSIS — J029 Acute pharyngitis, unspecified: Secondary | ICD-10-CM | POA: Diagnosis present

## 2020-01-13 DIAGNOSIS — Z20822 Contact with and (suspected) exposure to covid-19: Secondary | ICD-10-CM | POA: Insufficient documentation

## 2020-01-13 DIAGNOSIS — Z794 Long term (current) use of insulin: Secondary | ICD-10-CM | POA: Diagnosis not present

## 2020-01-13 DIAGNOSIS — E1140 Type 2 diabetes mellitus with diabetic neuropathy, unspecified: Secondary | ICD-10-CM | POA: Insufficient documentation

## 2020-01-13 LAB — SARS CORONAVIRUS 2 (TAT 6-24 HRS): SARS Coronavirus 2: NEGATIVE

## 2020-01-13 NOTE — Discharge Instructions (Addendum)
You will be called if covid test positive in next 24 hrs.  Follow results of strep and covid on my chart. If strep test positive you will need antibiotic called in to local pharmacy.  Return for shortness of breath or new concerns.

## 2020-01-13 NOTE — ED Provider Notes (Signed)
[facility] EMERGENCY DEPARTMENT Provider Note   CSN: [id number] Arrival date & time: 01/13/20  1221     History Chief Complaint  Patient presents with  . Covid Exposure    Ashley Freeman is a 29 y.o. female.  Patient presents with sore throat and requesting covid testing.  Close contact with covid positive family member. Pt has mild sore throat no other symptoms.   Diabetes well controlled, no concerns from that standpoint.          Past Medical History:  Diagnosis Date  . Abscess, gluteal, right 08/24/2013  . AKI (acute kidney injury) ([place]) 07/26/2014  . Anemia 02/19/2012  . Bartholin's gland abscess 09/19/2013  . BV (bacterial vaginosis) 11/24/2015  . Diabetes mellitus type I ([place]) 2001   Diagnosed at age 81 ; Type I  . Diarrhea 05/30/2016  . DKA (diabetic ketoacidoses) ([place]) 08/19/2013   Also in 2018  . Gonorrhea 08/2011   Treated in 09/2011  . History of trichomoniasis 05/31/2016  . Hyperlipidemia 03/28/2016  . Sepsis ([place]) 09/19/2013    Patient Active Problem List   Diagnosis Date Noted  . History of pre-eclampsia 10/05/2019  . Transaminitis 10/05/2019  . Elevated serum creatinine 10/05/2019  . Bilateral leg edema 09/27/2019  . Systolic ejection murmur 40/98/1191  . Shortness of breath 08/03/2019  . Type 1 diabetes mellitus with hyperglycemia ([place]) 07/24/2019  . Diabetic retinopathy ([place]) 07/02/2019  . 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 Diabetic neuropathy ([place]) 01/16/2016  . Diabetes mellitus type 1, uncontrolled, with complications ([place]) 47/82/9562    Past Surgical History:  Procedure Laterality Date  . CESAREAN SECTION N/A 10/05/2019   Procedure: CESAREAN SECTION;  Surgeon: Aletha Halim, MD;  [facility];  Service: Obstetrics;  Laterality: N/A;  . INCISION AND DRAINAGE ABSCESS Left 09/28/2019   Procedure: INCISION AND DRAINAGE VULVAR ABCESS;  Surgeon: [name], MD;  Location:  [facility];  Service: Gynecology;  Laterality: Left;  . INCISION AND DRAINAGE PERIRECTAL ABSCESS Right 08/18/2013   Procedure: IRRIGATION AND DEBRIDEMENT GLUTEAL ABSCESS;  Surgeon: Ralene Ok, MD;  Location: [facility];  Service: General;  Laterality: Right;  . INCISION AND DRAINAGE PERIRECTAL ABSCESS Right 09/19/2013   Procedure: IRRIGATION AND DEBRIDEMENT RIGHT GLUTEAL AND LABIAL ABSCESSES;  Surgeon: Ralene Ok, MD;  Location: [facility];  Service: General;  Laterality: Right;  . INCISION AND DRAINAGE PERIRECTAL ABSCESS Right 09/24/2013   Procedure: IRRIGATION AND DEBRIDEMENT PERIRECTAL ABSCESS;  Surgeon: Gwenyth Ober, MD;  Location: [facility];  Service: General;  Laterality: Right;     OB History    Gravida  2   Para  1   Term      Preterm  1   AB  1   Living  1     SAB  1   TAB      Ectopic      Multiple  0   Live Births  1           Family History  Problem Relation Age of Onset  . Asthma Mother   . Carpal tunnel syndrome Mother   . Gout Father   . Diabetes Paternal Grandmother   . Anesthesia problems Neg Hx     Social History   Tobacco Use  . Smoking status: Never Smoker  . Smokeless tobacco: Never Used  Vaping Use  . Vaping Use: Never used  Substance Use Topics  . Alcohol use: Not Currently  .  Drug use: No    Home Medications Prior to Admission medications   Medication Sig Start Date End Date Taking? Authorizing Provider  blood glucose meter kit and supplies KIT Dispense based on patient and insurance preference. Use up to four times daily as directed. (FOR ICD-9 250.00, 250.01). 05/25/17   Isla Pence, MD  Blood Pressure Monitoring (BLOOD PRESSURE KIT) DEVI 1 Device by Does not apply route as needed. 05/06/19   Anyanwu, Sallyanne Havers, MD  Continuous Blood Gluc Transmit (DEXCOM G6 TRANSMITTER) MISC Use one transmitter once every 90 days. 12/16/19   Shamleffer, Melanie Crazier, MD  Ferrous Sulfate (IRON) 325 (65 Fe) MG TABS Take 1 tablet (325 mg total) by mouth  every other day. 06/17/19   Fair, Marin Shutter, MD  hydrochlorothiazide (HYDRODIURIL) 25 MG tablet Take 1 tablet (25 mg total) by mouth daily. 10/08/19   Constant, Peggy, MD  insulin aspart (NOVOLOG) 100 UNIT/ML injection Use via insulin pump daily. Max daily 30 units 10/20/19   Shamleffer, Melanie Crazier, MD  Prenatal Vit-Fe Fumarate-FA (PREPLUS) 27-1 MG TABS Take 1 tablet by mouth daily. 09/01/19   [provider]    Allergies    Penicillins, Benadryl [diphenhydramine], and Doxycycline  Review of Systems   Review of Systems  Constitutional: Negative for chills and fever.  HENT: Negative for congestion.   Eyes: Negative for visual disturbance.  Respiratory: Negative for shortness of breath.   Cardiovascular: Negative for chest pain.  Gastrointestinal: Negative for abdominal pain and vomiting.  Genitourinary: Negative for dysuria and flank pain.  Musculoskeletal: Negative for back pain, neck pain and neck stiffness.  Skin: Negative for rash.  Neurological: Negative for light-headedness and headaches.    Physical Exam Updated Vital Signs BP 123/88 (BP Location: Right Arm)   Pulse 94   Temp 98.2 F (36.8 C) (Temporal)   Resp 18   LMP  (LMP Unknown)   SpO2 99%   Physical Exam Vitals and nursing note reviewed.  Constitutional:      Appearance: She is well-developed.  HENT:     Head: Normocephalic and atraumatic.     Comments: No trismus, uvular deviation, unilateral posterior pharyngeal edema or submandibular swelling.  Eyes:     General:        Right eye: No discharge.        Left eye: No discharge.     Conjunctiva/sclera: Conjunctivae normal.  Neck:     Trachea: No tracheal deviation.  Cardiovascular:     Rate and Rhythm: Normal rate and regular rhythm.  Pulmonary:     Effort: Pulmonary effort is normal.     Breath sounds: Normal breath sounds.  Musculoskeletal:     Cervical back: Normal range of motion and neck supple.  Skin:    General: Skin is warm.      Findings: No rash.  Neurological:     Mental Status: She is alert and oriented to person, place, and time.     ED Results / Procedures / Treatments   Labs (all labs ordered are listed, but only abnormal results are displayed) Labs Reviewed  GROUP A STREP BY PCR  SARS CORONAVIRUS 2 (TAT 6-24 HRS)    EKG None  Radiology No results found.  Procedures Procedures (including critical care time)  Medications Ordered in ED Medications - No data to display  ED Course  I have reviewed the triage vital signs and the nursing notes.  Pertinent labs & imaging results that were available during my care of the  patient were reviewed by me and considered in my medical decision making (see chart for details).    MDM Rules/Calculators/A&P                          Patient with DM hx presents with mild sore throat, no signs of serious bacterial infection or abscess.  COVID exposure, outpatient covid sent. Discussed reasons to return and supportive care.  Strep test pending.  Ashley Freeman was evaluated in Emergency Department on 01/13/2020 for the symptoms described in the history of present illness. She was evaluated in the context of the global COVID-19 pandemic, which necessitated consideration that the patient might be at risk for infection with the SARS-CoV-2 virus that causes COVID-19. Institutional protocols and algorithms that pertain to the evaluation of patients at risk for COVID-19 are in a state of rapid change based on information released by regulatory bodies including the CDC and federal and state organizations. These policies and algorithms were followed during the patient's care in the ED.  Final Clinical Impression(s) / ED Diagnoses Final diagnoses:  Close exposure to COVID-19 virus  Sore throat    Rx / DC Orders ED Discharge Orders    None       Elnora Morrison, MD 01/13/20 1528

## 2020-01-13 NOTE — ED Notes (Signed)
Patent awake alert, color pink,chest clear,good aeration,no retractions 3 plus pulses<2sec refill, ambulatory to wr after avs reviewed

## 2020-01-13 NOTE — ED Triage Notes (Signed)
Sore throat since yesterday, niece has covid, requests swab,no fever, robitussin prior to arrival

## 2020-01-13 NOTE — ED Notes (Signed)
patient awake alert, color pink,chest clear,good aeration,no retractions 3 plus pulses,2sec refill,awaiting provider

## 2020-01-14 ENCOUNTER — Other Ambulatory Visit: Payer: Self-pay

## 2020-01-14 ENCOUNTER — Other Ambulatory Visit: Payer: Self-pay | Admitting: Internal Medicine

## 2020-01-14 MED ORDER — OMNIPOD DASH PODS (GEN 4) MISC: 0 refills | Status: DC

## 2020-01-14 MED ORDER — DEXCOM G6 SENSOR MISC: 0 refills | Status: DC

## 2020-01-18 ENCOUNTER — Other Ambulatory Visit: Payer: Self-pay | Admitting: Internal Medicine

## 2020-01-19 ENCOUNTER — Other Ambulatory Visit: Payer: Self-pay

## 2020-01-19 ENCOUNTER — Ambulatory Visit (INDEPENDENT_AMBULATORY_CARE_PROVIDER_SITE_OTHER): Payer: BLUE CROSS/BLUE SHIELD

## 2020-01-19 VITALS — BP 118/79 | HR 106 | Ht 63.0 in | Wt 132.6 lb

## 2020-01-19 DIAGNOSIS — F32A Depression, unspecified: Secondary | ICD-10-CM

## 2020-01-19 DIAGNOSIS — Z3042 Encounter for surveillance of injectable contraceptive: Secondary | ICD-10-CM

## 2020-01-19 MED ORDER — MEDROXYPROGESTERONE ACETATE 150 MG/ML IM SUSP
150.0000 mg | INTRAMUSCULAR | 2 refills | Status: DC
Start: 2020-01-19 — End: 2020-07-25

## 2020-01-19 MED ORDER — MEDROXYPROGESTERONE ACETATE 150 MG/ML IM SUSP
150.0000 mg | Freq: Once | INTRAMUSCULAR | Status: AC
  Administered 2020-01-19: 150 mg via INTRAMUSCULAR

## 2020-01-19 NOTE — Progress Notes (Signed)
Patient seen and assessed by nursing staff.  Agree with documentation and plan.  

## 2020-01-19 NOTE — Progress Notes (Signed)
.  Date last pap: 12/10/2018 Last Depo-Provera: 11/03/2019 Side Effects if any: N/A Serum HCG indicated? N/A Depo-Provera 150 mg IM given by: Mickey Farber, CMA Next appointment due: Dec. 7th - Dec. 21th   Pt. Stated she was feeling depressed since delivery in June. Pt. Scored 13 on PHQ-0. Pt. Agreed to speaking with counselor. Referral sent.

## 2020-01-20 ENCOUNTER — Ambulatory Visit: Payer: BLUE CROSS/BLUE SHIELD | Admitting: Internal Medicine

## 2020-01-22 NOTE — Telephone Encounter (Signed)
error 

## 2020-01-26 ENCOUNTER — Other Ambulatory Visit: Payer: Self-pay

## 2020-01-26 ENCOUNTER — Encounter (HOSPITAL_COMMUNITY): Payer: Self-pay

## 2020-01-26 ENCOUNTER — Emergency Department (HOSPITAL_COMMUNITY)
Admission: EM | Admit: 2020-01-26 | Discharge: 2020-01-26 | Disposition: A | Discharge status: Home / Self Care | Payer: BLUE CROSS/BLUE SHIELD | Attending: Emergency Medicine | Admitting: Emergency Medicine

## 2020-01-26 DIAGNOSIS — R519 Headache, unspecified: Secondary | ICD-10-CM | POA: Diagnosis present

## 2020-01-26 DIAGNOSIS — R109 Unspecified abdominal pain: Secondary | ICD-10-CM | POA: Diagnosis not present

## 2020-01-26 DIAGNOSIS — Z5321 Procedure and treatment not carried out due to patient leaving prior to being seen by health care provider: Secondary | ICD-10-CM | POA: Diagnosis not present

## 2020-01-26 DIAGNOSIS — R229 Localized swelling, mass and lump, unspecified: Secondary | ICD-10-CM | POA: Insufficient documentation

## 2020-01-26 NOTE — ED Triage Notes (Signed)
Patient c/o headache and abdominal pain since last night. Patient states she has "a knot under her skin."

## 2020-01-30 ENCOUNTER — Encounter (HOSPITAL_COMMUNITY): Payer: Self-pay | Admitting: Emergency Medicine

## 2020-01-30 ENCOUNTER — Emergency Department (HOSPITAL_COMMUNITY)
Admission: EM | Admit: 2020-01-30 | Discharge: 2020-01-30 | Disposition: A | Discharge status: Home / Self Care | Payer: BLUE CROSS/BLUE SHIELD | Attending: Emergency Medicine | Admitting: Emergency Medicine

## 2020-01-30 ENCOUNTER — Other Ambulatory Visit: Payer: Self-pay

## 2020-01-30 DIAGNOSIS — R222 Localized swelling, mass and lump, trunk: Secondary | ICD-10-CM | POA: Diagnosis not present

## 2020-01-30 DIAGNOSIS — Z5321 Procedure and treatment not carried out due to patient leaving prior to being seen by health care provider: Secondary | ICD-10-CM | POA: Insufficient documentation

## 2020-01-30 DIAGNOSIS — R42 Dizziness and giddiness: Secondary | ICD-10-CM | POA: Diagnosis present

## 2020-01-30 DIAGNOSIS — R739 Hyperglycemia, unspecified: Secondary | ICD-10-CM | POA: Insufficient documentation

## 2020-01-30 LAB — BASIC METABOLIC PANEL
Anion gap: 7 (ref 5–15)
BUN: 18 mg/dL (ref 6–20)
CO2: 18 mmol/L — ABNORMAL LOW (ref 22–32)
Calcium: 9 mg/dL (ref 8.9–10.3)
Chloride: 111 mmol/L (ref 98–111)
Creatinine, Ser: 1.8 mg/dL — ABNORMAL HIGH (ref 0.44–1.00)
GFR calc Af Amer: 44 mL/min — ABNORMAL LOW (ref >60)
GFR calc non Af Amer: 38 mL/min — ABNORMAL LOW (ref >60)
Glucose, Bld: 177 mg/dL — ABNORMAL HIGH (ref 70–99)
Potassium: 4.5 mmol/L (ref 3.5–5.1)
Sodium: 136 mmol/L (ref 135–145)

## 2020-01-30 LAB — CBC
HCT: 35.3 % — ABNORMAL LOW (ref 36.0–46.0)
Hemoglobin: 11 g/dL — ABNORMAL LOW (ref 12.0–15.0)
MCH: 26 pg (ref 26.0–34.0)
MCHC: 31.2 g/dL (ref 30.0–36.0)
MCV: 83.5 fL (ref 80.0–100.0)
Platelets: 292 10*3/uL (ref 150–400)
RBC: 4.23 MIL/uL (ref 3.87–5.11)
RDW: 13 % (ref 11.5–15.5)
WBC: 5.1 10*3/uL (ref 4.0–10.5)
nRBC: 0 % (ref 0.0–0.2)

## 2020-01-30 LAB — I-STAT BETA HCG BLOOD, ED (MC, WL, AP ONLY): I-stat hCG, quantitative: <5 m[IU]/mL (ref <5)

## 2020-01-30 MED ORDER — SODIUM CHLORIDE 0.9% FLUSH: 3.0000 mL | Freq: Once | INTRAVENOUS | Status: DC

## 2020-01-30 NOTE — ED Triage Notes (Signed)
Pt states she was sent form [facility].  C/o dizziness x 3 days and "knot under skin" on L pelvis area x 3 days.  States blood sugars have been running in the 200s.  Denies nausea and vomiting.

## 2020-01-30 NOTE — ED Notes (Signed)
Pt states that she has to get home to her child and will come back if sx worsen. LWBS

## 2020-02-01 ENCOUNTER — Emergency Department (HOSPITAL_COMMUNITY)
Admission: EM | Admit: 2020-02-01 | Discharge: 2020-02-01 | Disposition: A | Discharge status: Home / Self Care | Payer: BLUE CROSS/BLUE SHIELD | Attending: Emergency Medicine | Admitting: Emergency Medicine

## 2020-02-01 ENCOUNTER — Other Ambulatory Visit: Payer: Self-pay

## 2020-02-01 ENCOUNTER — Encounter (HOSPITAL_COMMUNITY): Payer: Self-pay

## 2020-02-01 DIAGNOSIS — I959 Hypotension, unspecified: Secondary | ICD-10-CM | POA: Insufficient documentation

## 2020-02-01 DIAGNOSIS — R1904 Left lower quadrant abdominal swelling, mass and lump: Secondary | ICD-10-CM | POA: Insufficient documentation

## 2020-02-01 DIAGNOSIS — R109 Unspecified abdominal pain: Secondary | ICD-10-CM | POA: Insufficient documentation

## 2020-02-01 DIAGNOSIS — Z5321 Procedure and treatment not carried out due to patient leaving prior to being seen by health care provider: Secondary | ICD-10-CM | POA: Diagnosis not present

## 2020-02-01 NOTE — ED Triage Notes (Signed)
Pt presents with ongoing low BP and a knot under the skin to her LLQ area. Seen here 3 days ago for the same

## 2020-02-01 NOTE — ED Notes (Signed)
Pt was called several times for vital recheck and no answer. Pt moved OTF

## 2020-02-02 ENCOUNTER — Emergency Department (HOSPITAL_COMMUNITY)
Admission: EM | Admit: 2020-02-02 | Discharge: 2020-02-02 | Disposition: A | Discharge status: Home / Self Care | Payer: BLUE CROSS/BLUE SHIELD | Attending: Emergency Medicine | Admitting: Emergency Medicine

## 2020-02-02 ENCOUNTER — Other Ambulatory Visit: Payer: Self-pay

## 2020-02-02 ENCOUNTER — Encounter (HOSPITAL_COMMUNITY): Payer: Self-pay

## 2020-02-02 DIAGNOSIS — Z794 Long term (current) use of insulin: Secondary | ICD-10-CM | POA: Insufficient documentation

## 2020-02-02 DIAGNOSIS — E1065 Type 1 diabetes mellitus with hyperglycemia: Secondary | ICD-10-CM | POA: Insufficient documentation

## 2020-02-02 DIAGNOSIS — L739 Follicular disorder, unspecified: Secondary | ICD-10-CM | POA: Insufficient documentation

## 2020-02-02 DIAGNOSIS — R229 Localized swelling, mass and lump, unspecified: Secondary | ICD-10-CM | POA: Diagnosis present

## 2020-02-02 LAB — COMPREHENSIVE METABOLIC PANEL
ALT: 14 U/L (ref 0–44)
AST: 17 U/L (ref 15–41)
Albumin: 3.7 g/dL (ref 3.5–5.0)
Alkaline Phosphatase: 44 U/L (ref 38–126)
Anion gap: 12 (ref 5–15)
BUN: 22 mg/dL — ABNORMAL HIGH (ref 6–20)
CO2: 17 mmol/L — ABNORMAL LOW (ref 22–32)
Calcium: 9.5 mg/dL (ref 8.9–10.3)
Chloride: 111 mmol/L (ref 98–111)
Creatinine, Ser: 1.35 mg/dL — ABNORMAL HIGH (ref 0.44–1.00)
GFR calc non Af Amer: 54 mL/min — ABNORMAL LOW (ref >60)
Glucose, Bld: 174 mg/dL — ABNORMAL HIGH (ref 70–99)
Potassium: 4.6 mmol/L (ref 3.5–5.1)
Sodium: 140 mmol/L (ref 135–145)
Total Bilirubin: 0.6 mg/dL (ref 0.3–1.2)
Total Protein: 7.8 g/dL (ref 6.5–8.1)

## 2020-02-02 LAB — I-STAT BETA HCG BLOOD, ED (MC, WL, AP ONLY): I-stat hCG, quantitative: <5 m[IU]/mL (ref <5)

## 2020-02-02 LAB — URINALYSIS, ROUTINE W REFLEX MICROSCOPIC
Bilirubin Urine: NEGATIVE
Glucose, UA: NEGATIVE mg/dL
Ketones, ur: NEGATIVE mg/dL
Nitrite: NEGATIVE
Protein, ur: >=300 mg/dL — AB
Specific Gravity, Urine: 1.012 (ref 1.005–1.030)
pH: 6 (ref 5.0–8.0)

## 2020-02-02 LAB — CBC
HCT: 34.7 % — ABNORMAL LOW (ref 36.0–46.0)
Hemoglobin: 11.1 g/dL — ABNORMAL LOW (ref 12.0–15.0)
MCH: 26.8 pg (ref 26.0–34.0)
MCHC: 32 g/dL (ref 30.0–36.0)
MCV: 83.8 fL (ref 80.0–100.0)
Platelets: 270 10*3/uL (ref 150–400)
RBC: 4.14 MIL/uL (ref 3.87–5.11)
RDW: 13.2 % (ref 11.5–15.5)
WBC: 5.1 10*3/uL (ref 4.0–10.5)
nRBC: 0 % (ref 0.0–0.2)

## 2020-02-02 LAB — LIPASE, BLOOD: Lipase: 19 U/L (ref 11–51)

## 2020-02-02 NOTE — ED Provider Notes (Signed)
[facility] DEPT Provider Note   CSN: [id number] Arrival date & time: 02/02/20  1121     History Chief Complaint  Patient presents with  . Abdominal Pain  . knot under skin    Ashley Freeman is a 29 y.o. female.  HPI Patient is here for evaluation of in her pubic hair area.  It has been present for several days.  She was at the urgent care, 01/30/2020, referred to the ED, presented there but left without being seen.  She also presented to the ED yesterday for the same problem, and left before evaluation.  She states that her blood sugars were higher several days ago but now they are "in the 100s".  She denies significant dizziness with walking, fever, chills, nausea, vomiting, weakness or dizziness.  She is taking her usual medications as prescribed.  There are no other known modifying factors.    Past Medical History:  Diagnosis Date  . Abscess, gluteal, right 08/24/2013  . AKI (acute kidney injury) ([place]) 07/26/2014  . Anemia 02/19/2012  . Bartholin's gland abscess 09/19/2013  . BV (bacterial vaginosis) 11/24/2015  . Diabetes mellitus type I ([place]) 2001   Diagnosed at age 64 ; Type I  . Diarrhea 05/30/2016  . DKA (diabetic ketoacidoses) 08/19/2013   Also in 2018  . Gonorrhea 08/2011   Treated in 09/2011  . History of trichomoniasis 05/31/2016  . Hyperlipidemia 03/28/2016  . Sepsis ([place]) 09/19/2013    Patient Active Problem List   Diagnosis Date Noted  . History of pre-eclampsia 10/05/2019  . Transaminitis 10/05/2019  . Elevated serum creatinine 10/05/2019  . Bilateral leg edema 09/27/2019  . Systolic ejection murmur 20/94/7096  . Shortness of breath 08/03/2019  . Type 1 diabetes mellitus with hyperglycemia ([place]) 07/24/2019  . Diabetic retinopathy ([place]) 07/02/2019  . Proteinuria due to type 1 diabetes mellitus ([place]) 05/21/2019  . Diabetic neuropathy ([place]) 01/16/2016  . Diabetes mellitus type 1, uncontrolled, with complications ([place])  28/36/6294    Past Surgical History:  Procedure Laterality Date  . CESAREAN SECTION N/A 10/05/2019   Procedure: CESAREAN SECTION;  Surgeon: Aletha Halim, MD;  [facility];  Service: Obstetrics;  Laterality: N/A;  . INCISION AND DRAINAGE ABSCESS Left 09/28/2019   Procedure: INCISION AND DRAINAGE VULVAR ABCESS;  Surgeon: [name], MD;  Location: [facility];  Service: Gynecology;  Laterality: Left;  . INCISION AND DRAINAGE PERIRECTAL ABSCESS Right 08/18/2013   Procedure: IRRIGATION AND DEBRIDEMENT GLUTEAL ABSCESS;  Surgeon: Ralene Ok, MD;  Location: [facility];  Service: General;  Laterality: Right;  . INCISION AND DRAINAGE PERIRECTAL ABSCESS Right 09/19/2013   Procedure: IRRIGATION AND DEBRIDEMENT RIGHT GLUTEAL AND LABIAL ABSCESSES;  Surgeon: Ralene Ok, MD;  Location: [facility];  Service: General;  Laterality: Right;  . INCISION AND DRAINAGE PERIRECTAL ABSCESS Right 09/24/2013   Procedure: IRRIGATION AND DEBRIDEMENT PERIRECTAL ABSCESS;  Surgeon: Gwenyth Ober, MD;  Location: [facility];  Service: General;  Laterality: Right;     OB History    Gravida  2   Para  1   Term      Preterm  1   AB  1   Living  1     SAB  1   TAB      Ectopic      Multiple  0   Live Births  1           Family History  Problem Relation Age of Onset  . Asthma Mother   .  Carpal tunnel syndrome Mother   . Gout Father   . Diabetes Paternal Grandmother   . Anesthesia problems Neg Hx     Social History   Tobacco Use  . Smoking status: Never Smoker  . Smokeless tobacco: Never Used  Vaping Use  . Vaping Use: Never used  Substance Use Topics  . Alcohol use: Not Currently  . Drug use: No    Home Medications Prior to Admission medications   Medication Sig Start Date End Date Taking? Authorizing Provider  blood glucose meter kit and supplies KIT Dispense based on patient and insurance preference. Use up to four times daily as directed. (FOR ICD-9 250.00, 250.01). 05/25/17    Isla Pence, MD  Blood Pressure Monitoring (BLOOD PRESSURE KIT) DEVI 1 Device by Does not apply route as needed. 05/06/19   Anyanwu, Sallyanne Havers, MD  Continuous Blood Gluc Sensor (DEXCOM G6 SENSOR) MISC USE AS DIRECTED. CHANGE SENSOR EVERY 10 DAYS 01/14/20   Shamleffer, Melanie Crazier, MD  Continuous Blood Gluc Transmit (DEXCOM G6 TRANSMITTER) MISC Use one transmitter once every 90 days. 12/16/19   Shamleffer, Melanie Crazier, MD  Ferrous Sulfate (IRON) 325 (65 Fe) MG TABS Take 1 tablet (325 mg total) by mouth every other day. Patient not taking: Reported on 01/19/2020 06/17/19   Chauncey Mann, MD  hydrochlorothiazide (HYDRODIURIL) 25 MG tablet Take 1 tablet (25 mg total) by mouth daily. Patient not taking: Reported on 01/19/2020 10/08/19   Constant, Peggy, MD  insulin aspart (NOVOLOG) 100 UNIT/ML injection Use via insulin pump daily. Max daily 30 units Patient not taking: Reported on 01/19/2020 10/20/19   Shamleffer, Melanie Crazier, MD  Insulin Disposable Pump (OMNIPOD DASH 5 PACK PODS) MISC USE 1 AS DIRECTED Patient not taking: Reported on 01/19/2020 01/14/20   Shamleffer, Melanie Crazier, MD  medroxyPROGESTERone (DEPO-PROVERA) 150 MG/ML injection Inject 1 mL (150 mg total) into the muscle every 3 (three) months. 01/19/20   Donnamae Jude, MD  Prenatal Vit-Fe Fumarate-FA (PREPLUS) 27-1 MG TABS Take 1 tablet by mouth daily. Patient not taking: Reported on 01/19/2020 09/01/19   [provider]    Allergies    Penicillins, Benadryl [diphenhydramine], and Doxycycline  Review of Systems   Review of Systems  All other systems reviewed and are negative.   Physical Exam Updated Vital Signs BP 121/84   Pulse (!) 110   Temp 99.7 F (37.6 C) (Oral)   Resp 16   Ht _0  (1.6 m)   Wt 61.2 kg   LMP  (LMP Unknown)   SpO2 99%   BMI 23.91 kg/m   Physical Exam Vitals and nursing note reviewed.  Constitutional:      General: She is not in acute distress.    Appearance: She is well-developed.  She is not ill-appearing, toxic-appearing or diaphoretic.  HENT:     Head: Normocephalic and atraumatic.     Right Ear: External ear normal.     Left Ear: External ear normal.  Eyes:     Conjunctiva/sclera: Conjunctivae normal.     Pupils: Pupils are equal, round, and reactive to light.  Neck:     Trachea: Phonation normal.  Cardiovascular:     Rate and Rhythm: Normal rate.  Pulmonary:     Effort: Pulmonary effort is normal.  Abdominal:     General: There is no distension.     Palpations: Abdomen is soft. There is no mass.     Tenderness: There is no abdominal tenderness. There is no guarding.  Musculoskeletal:        General: Normal range of motion.     Cervical back: Normal range of motion and neck supple.  Skin:    General: Skin is warm and dry.     Comments: Small follicular type mass and left upper pubic hair region.  This is mobile, and mildly tender to palpation there is no overlying skin change, redness, drainage or associated adenopathy.  Neurological:     Mental Status: She is alert and oriented to person, place, and time.     Cranial Nerves: No cranial nerve deficit.     Sensory: No sensory deficit.     Motor: No abnormal muscle tone.     Coordination: Coordination normal.  Psychiatric:        Behavior: Behavior normal.        Thought Content: Thought content normal.        Judgment: Judgment normal.     ED Results / Procedures / Treatments   Labs (all labs ordered are listed, but only abnormal results are displayed) Labs Reviewed  COMPREHENSIVE METABOLIC PANEL - Abnormal; Notable for the following components:      Result Value   CO2 17 (*)    Glucose, Bld 174 (*)    BUN 22 (*)    Creatinine, Ser 1.35 (*)    GFR calc non Af Amer 54 (*)    All other components within normal limits  CBC - Abnormal; Notable for the following components:   Hemoglobin 11.1 (*)    HCT 34.7 (*)    All other components within normal limits  LIPASE, BLOOD  URINALYSIS, ROUTINE W  REFLEX MICROSCOPIC  I-STAT BETA HCG BLOOD, ED ([facility], [facility], [facility] ONLY)    EKG None  Radiology No results found.  Procedures Procedures (including critical care time)  Medications Ordered in ED Medications - No data to display  ED Course  I have reviewed the triage vital signs and the nursing notes.  Pertinent labs & imaging results that were available during my care of the patient were reviewed by me and considered in my medical decision making (see chart for details).    MDM Rules/Calculators/A&P                           Patient Vitals for the past 24 hrs:  BP Temp Temp src Pulse Resp SpO2 Height Weight  02/02/20 1547 121/84 -- -- (!) 110 16 99 % -- --  02/02/20 1339 116/84 99.7 F (37.6 C) Oral (!) 101 16 100 % -- --  02/02/20 1134 -- -- -- -- -- -- _0  (1.6 m) 61.2 kg  02/02/20 1130 109/81 98.6 F (37 C) Oral (!) 111 16 99 % -- --    4:14 PM Reevaluation with update and discussion. After initial assessment and treatment, an updated evaluation reveals no change in clinical status, 5 discussed with patient and all questions were answered. Daleen Bo   Medical Decision Making:  This patient is presenting for evaluation of a tiny nodule in her pubic hair region.  She is not currently complaining of problems associated with diabetes or blood volume, which does not require a range of treatment options, and is not a complaint that involves a high risk of morbidity and mortality. The differential diagnoses include abscess, folliculitis, adenopathy. I decided to review old records, and in summary patient with diabetes, presenting now for the third ED visit, over the last 3 days.  Prior two visits she has not evaluated by a provider.  I did not require additional historical information from anyone.  Clinical Laboratory Tests Ordered, included CBC, Metabolic panel and Pregnancy test, urinalysis. Review indicates mild elevation of glucose, BUN and creatinine; CO2 somewhat low.   These abnormalities are near baseline   Critical Interventions-clinical evaluation, laboratory testing, observation reassessment  After These Interventions, the Patient was reevaluated and was found stable for discharge  CRITICAL CARE-no Performed by: Daleen Bo  Nursing Notes Reviewed/ Care Coordinated Applicable Imaging Reviewed Interpretation of Laboratory Data incorporated into ED treatment  The patient appears reasonably screened and/or stabilized for discharge and I doubt any other medical condition or other [facility] requiring further screening, evaluation, or treatment in the ED at this time prior to discharge.  Plan: Home Medications-continue usual medications; Home Treatments-warm compress to affected area; return here if the recommended treatment, does not improve the symptoms; Recommended follow up-PCP, as needed     Final Clinical Impression(s) / ED Diagnoses Final diagnoses:  Folliculitis    Rx / DC Orders ED Discharge Orders    None       Daleen Bo, MD 02/02/20 1620

## 2020-02-02 NOTE — Discharge Instructions (Addendum)
The bump that you have in the area of the pubic hair, is a follicle that has been inflamed/infected.  To treat this use a warm compress on it 3-4 times a day for 30 minutes.  You can also take Tylenol for pain.  This should tend to improve the condition within 5 or 6 days.  If it gets bigger, more tender, or begins to drain anything return for reevaluation.  The testing we did for your blood sugar and vital signs do not show any serious problems.  Continue to take your medication for diabetes and drink a lot of fluid, to prevent dizziness.

## 2020-02-02 NOTE — ED Triage Notes (Signed)
Patient c/o a knot under her skin to the LLQ ara x 1 week. Patient states she has been lightheaded x 4 days.

## 2020-02-03 ENCOUNTER — Other Ambulatory Visit: Payer: Self-pay | Admitting: Internal Medicine

## 2020-02-08 ENCOUNTER — Ambulatory Visit: Payer: BLUE CROSS/BLUE SHIELD | Admitting: Internal Medicine

## 2020-02-08 DIAGNOSIS — E109 Type 1 diabetes mellitus without complications: Secondary | ICD-10-CM | POA: Insufficient documentation

## 2020-02-08 NOTE — Progress Notes (Deleted)
Name: Ashley Freeman  Age/ Sex: 29 y.o., female   MRN/ DOB: [id number], 10/28/1990     PCP: Patient, No Pcp Per   Reason for Endocrinology Evaluation: Type 2 Diabetes Mellitus  Initial Endocrine Consultative Visit: 06/27/18    PATIENT IDENTIFIER: [name] is a 29 y.o. female with a past medical history of T1DM with multiple DKA's. The patient has followed with Endocrinology clinic since 06/27/2018 for consultative assistance with management of her diabetes.  DIABETIC HISTORY:  Ashley Freeman was diagnosed with T1DM at age 54. Hx of multiple DKA's due to non-compliance. Her hemoglobin A1c has ranged from  in 11.3% in 2011, peaking at 19.3% in 2015.  She is S/P C-section 09/2019 Martie Lee )  SUBJECTIVE:   During the last visit (10/20/2019): A1c 6.8 % .  We continued pump settings  Today (02/08/2020): Ashley Freeman is here for a follow up on diabetes management . 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 During the visit the pump basal and bolus doses were reviewed including carb/insulin rations and supplemental doses. The clinical list was updated. The glucose meter download was reviewed in detail to determine if the current pump settings are providing the best glycemic control without excessive hypoglycemia.  Pump and meter download:    Pump   Omnipod Settings   Insulin type   NOVOLOG    Basal rate       0000-0800 0.600   0800- 0000 0.550          I:C ratio       0000 12              AIT  4 hrs        Sensitivity       0000  65       Goal       0000  100             Type & Model of Pump: Omnipod Insulin Type: Currently using Novolog.  There is no height or weight on file to  calculate BMI.  PUMP STATISTICS: Average BG: 173 Average Daily Carbs (g): 36 Average Total Daily Insulin: 11.7 Average Daily Basal: 5.9 (79 %) Average Daily Bolus: 1.6 (21 %)      CONTINUOUS GLUCOSE MONITORING RECORD INTERPRETATION    Dates of Recording: 6/9-6/22/2021  Sensor description: Dexcom  Results statistics:   CGM use % of time 7  Average and SD 147/43  Time in range  76    %  % Time Above 180 21  % Time above 250 < 1  % Time Below target 2   Glycemic patterns summary: Tight BG's over night , mild hyperglycemia during the day with tight BG's post-bolus  Hyperglycemic episodes  Post-prandial   Hypoglycemic episodes occurred overnight and post-bolus   Overnight periods: trends down     HISTORY:  Past Medical History:  Past Medical History:  Diagnosis Date  . Abscess, gluteal, right 08/24/2013  . AKI (acute kidney injury) (HCC) 07/26/2014  . Anemia 02/19/2012  . Bartholin's gland abscess 09/19/2013  . BV (bacterial vaginosis) 11/24/2015  . Diabetes mellitus type I (HCC) 2001   Diagnosed at age 71 ;  Type I  . Diarrhea 05/30/2016  . DKA (diabetic ketoacidoses) 08/19/2013   Also in 2018  . Gonorrhea 08/2011   Treated in 09/2011  . History of trichomoniasis 05/31/2016  . Hyperlipidemia 03/28/2016  . Sepsis (HCC) 09/19/2013   Past Surgical History:  Past Surgical History:  Procedure Laterality Date  . CESAREAN SECTION N/A 10/05/2019   Procedure: CESAREAN SECTION;  Surgeon: [place] Bing, MD;  Location: [facility] LD ORS;  Service: Obstetrics;  Laterality: N/A;  . INCISION AND DRAINAGE ABSCESS Left 09/28/2019   Procedure: INCISION AND DRAINAGE VULVAR ABCESS;  Surgeon: Tilda Burrow, MD;  Location: [facility] OR;  Service: Gynecology;  Laterality: Left;  . INCISION AND DRAINAGE PERIRECTAL ABSCESS Right 08/18/2013   Procedure: IRRIGATION AND DEBRIDEMENT GLUTEAL ABSCESS;  Surgeon: Axel Filler, MD;  Location: [facility] OR;  Service: General;  Laterality: Right;  . INCISION AND  DRAINAGE PERIRECTAL ABSCESS Right 09/19/2013   Procedure: IRRIGATION AND DEBRIDEMENT RIGHT GLUTEAL AND LABIAL ABSCESSES;  Surgeon: Axel Filler, MD;  Location: [facility] OR;  Service: General;  Laterality: Right;  . INCISION AND DRAINAGE PERIRECTAL ABSCESS Right 09/24/2013   Procedure: IRRIGATION AND DEBRIDEMENT PERIRECTAL ABSCESS;  Surgeon: Cherylynn Ridges, MD;  Location: [facility] OR;  Service: General;  Laterality: Right;    Social History:  reports that she has never smoked. She has never used smokeless tobacco. She reports previous alcohol use. She reports that she does not use drugs. Family History:  Family History  Problem Relation Age of Onset  . Asthma Mother   . Carpal tunnel syndrome Mother   . Gout Father   . Diabetes Paternal Grandmother   . Anesthesia problems Neg Hx      HOME MEDICATIONS: Allergies as of 02/08/2020      Reactions   Penicillins Hives, Rash   Has patient had a PCN reaction causing immediate rash, facial/tongue/throat swelling, SOB or lightheadedness with hypotension: Yes Has patient had a PCN reaction causing severe rash involving mucus membranes or skin necrosis: No Has patient had a PCN reaction that required hospitalization: Yes Has patient had a PCN reaction occurring within the last 10 years: No If all of the above answers are "NO", then may proceed with Cephalosporin use.   Benadryl [diphenhydramine] Itching   Doxycycline Itching      Medication List       Accurate as of February 08, 2020  7:13 AM. If you have any questions, ask your nurse or doctor.        blood glucose meter kit and supplies Kit Dispense based on patient and insurance preference. Use up to four times daily as directed. (FOR ICD-9 250.00, 250.01).   Blood Pressure Kit Devi 1 Device by Does not apply route as needed.   Dexcom G6 Sensor Misc USE AS DIRECTED. CHANGE SENSOR EVERY 10 DAYS   Dexcom G6 Transmitter Misc Use one transmitter once every 90 days.   hydrochlorothiazide 25 MG  tablet Commonly known as: HYDRODIURIL Take 1 tablet (25 mg total) by mouth daily.   insulin aspart 100 UNIT/ML injection Commonly known as: NovoLOG Use via insulin pump daily. Max daily 30 units   Iron 325 (65 Fe) MG Tabs Take 1 tablet (325 mg total) by mouth every other day.   medroxyPROGESTERone 150 MG/ML injection Commonly known as: DEPO-PROVERA Inject 1 mL (150 mg total) into the muscle every 3 (three) months.   OmniPod Dash 5 Pack Pods Misc USE 1 AS DIRECTED   PrePLUS 27-1 MG Tabs Take 1 tablet by mouth  daily.        OBJECTIVE:   PHYSICAL EXAM: VS:LMP  (LMP Unknown)   LMP  (LMP Unknown)    EXAM: General: Pt appears well and is in NAD  Lungs: Clear with good BS bilat with no rales, rhonchi, or wheezes  Heart: RRR  Abdomen:  Incision is clean, abdomen is soft and non-tender  Extremities: BL LE: 1+  pretibial edema   Mental Status: Judgment, insight: intact Mood and affect: no depression, anxiety, or agitation     DM foot exam: 04/08/2019 The skin of the feet is intact without sores or ulcerations. The pedal pulses are 2+ on right and 2+ on left. The sensation is intact to a screening 5.07, 10 gram monofilament bilaterally     DATA REVIEWED:  Lab Results  Component Value Date   HGBA1C 6.8 (H) 09/27/2019   HGBA1C 6.4 (A) 08/13/2019   HGBA1C 7.8 (H) 05/20/2019     Lab Results  Component Value Date   MICRALBCREAT 115.3 (H) 04/08/2019    Results for Ashley Freeman, Ashley Freeman (MRN [id number]) as of 04/08/2019 16:35  Ref. Range 04/08/2019 10:20  MICROALB/CREAT RATIO Latest Ref Range: 0.0 - 30.0 mg/g 115.3 (H)    ASSESSMENT / PLAN / RECOMMENDATIONS:   1) Type 1 Diabetes Mellitus, Optimally controlled, With microalbuminuria - Most recent A1c of 6.8 %. Goal A1c < 6.5%.    - Pt is not entering enough CHO. She is over basalized at this time. Will adjust pump settings as below  - I have again encouraged her to enter all carbs consumed   MEDICATIONS: Novolog  through the Omnipod  Pump   Omnipod Settings   Basal rate       0000 0.500              I:C ratio       0000 14          Sensitivity       0000  65      Goal       0000  100    EDUCATION / INSTRUCTIONS:  BG monitoring instructions: Patient is instructed to check her blood sugars 4 times a day, before meals and bedtime   Call [place] [facility] clinic if: BG persistently < 70 . I reviewed the Rule of 15 for the treatment of hypoglycemia in detail with the patient. Literature supplied.     F/U in 3 months      Signed electronically by: Lyndle Herrlich, MD  [facility] Endocrinology  [facility] [address] [place], [place] [address] Phone: [phone] FAX: [phone]   CC: Patient, No Pcp Per No address on file Phone: None  Fax: None  Return to Endocrinology clinic as below: Future Appointments  Date Time Provider Department Center  02/08/2020 11:10 AM Kearstyn Avitia, Konrad Dolores, MD [facility] None  04/05/2020 11:00 AM [facility] NURSE [facility]

## 2020-02-22 NOTE — BH Specialist Note (Deleted)
Integrated Behavioral Health via Telemedicine Video [facility]) Visit  02/22/2020 DYMIN DINGLEDINE [id number]  Number of [place] visits: 1 Session Start time: 2:45***  Session End time: 3:!5*** Total time: {IBH Total Time:21014050} minutes  Referring Provider: Darron Doom, MD Type of Service: Individual, Family, *** Patient/Family location: Home [facility] Provider location: Center for [employer] at [facility]  All persons participating in visit: Patient *** and [place] ***    I connected with Yides S [place] and/or Asuka S [place]'s {family members:20773} by a video enabled telemedicine application ([place]) and verified that I am speaking with the correct person using two identifiers.   Discussed confidentiality: {YES/NO:21197}  Confirmed demographics & insurance:  {YES/NO:21197}  I discussed that engaging in this virtual visit, they consent to the provision of behavioral healthcare and the services will be billed under their insurance.   Patient and/or legal guardian expressed understanding and consented to virtual visit: {YES/NO:21197}  PRESENTING CONCERNS: Patient and/or family reports the following symptoms/concerns: *** Duration of problem: ***; Severity of problem: {Mild/Moderate/Severe:20260}  STRENGTHS (Protective Factors/Coping Skills): {CHL AMB BH PROTECTIVE FACTORS/[phone]}  ASSESSMENT: Patient currently experiencing ***.    GOALS ADDRESSED: Patient will: 1.  Reduce symptoms of: {IBH Symptoms:21014056}  2.  Increase knowledge and/or ability of: {IBH Patient Tools:21014057}  3.  Demonstrate ability to: {IBH Goals:21014053}   Progress of Goals: {CHL AMB BH PROGRESS TOWARDS PTWSF:[id number]}  INTERVENTIONS: Interventions utilized:  {IBH Interventions:21014054} Standardized Assessments completed & reviewed: {IBH Screening  Tools:21014051}   OUTCOME: Patient Response: ***   PLAN: 1. Follow up with behavioral health clinician on : *** 2. Behavioral recommendations: *** 3. Referral(s): {IBH Referrals:21014055}  I discussed the assessment and treatment plan with the patient and/or parent/guardian. They were provided an opportunity to ask questions and all were answered. They agreed with the plan and demonstrated an understanding of the instructions.   They were advised to call back or seek an in-person evaluation as appropriate.  I discussed that the purpose of this visit is to provide behavioral health care while limiting exposure to the novel coronavirus.  Discussed there is a possibility of technology failure and discussed alternative modes of communication if that failure occurs.  [name] Brycen Bean  Depression screen [facility] 2/9 01/19/2020 05/20/2019 05/06/2019 12/10/2018 10/27/2018  Decreased Interest 2 2 0 2 1  Down, Depressed, Hopeless 3 0 0 2 0  PHQ - 2 Score 5 2 0 4 1  Altered sleeping 2 2 0 2 2  Tired, decreased energy 2 2 0 3 3  Change in appetite 1 1 0 1 0  Feeling bad or failure about yourself  3 0 0 1 0  Trouble concentrating 0 0 0 0 0  Moving slowly or fidgety/restless 0 0 0 0 0  Suicidal thoughts 0 0 0 0 0  PHQ-9 Score 13 7 0 11 6  Some recent data might be hidden   GAD 7 : Generalized Anxiety Score 05/20/2019 05/06/2019 12/10/2018 10/27/2018  Nervous, Anxious, on Edge 1 0 1 1  Control/stop worrying 0 1 0 1  Worry too much - different things 1 1 1 1   Trouble relaxing 2 1 2 2   Restless 0 0 1 1  Easily annoyed or irritable 2 1 2 3   Afraid - awful might happen 0 1 0 0  Total GAD 7 Score 6 5 7  9

## 2020-02-23 NOTE — BH Specialist Note (Signed)
Error

## 2020-03-02 ENCOUNTER — Ambulatory Visit: Payer: Self-pay | Admitting: Clinical

## 2020-03-02 DIAGNOSIS — F418 Other specified anxiety disorders: Secondary | ICD-10-CM

## 2020-03-02 DIAGNOSIS — F4323 Adjustment disorder with mixed anxiety and depressed mood: Secondary | ICD-10-CM

## 2020-03-02 NOTE — Progress Notes (Signed)
I reviewed patient visit with the [facility] Intern, and I concur with the treatment plan, as documented in the [facility] Intern note.   No charge for this visit due to [facility] Intern seeing patient.  Vesta Mixer, MSW, [place] for [facility] at [facility] for [place] via [facility]) Visit  03/02/2020 Ashley Freeman [id number]  Number of [place] visits: 1 Session Start time: 3:15  Session End time: 4:15 Total time: 60 minutes  Referring Provider: Darron Doom, MD Type of Service: Individual Patient/Family location: Home [facility] Provider location: [place] [facility] All persons participating in visit: Patient, [facility] Intern   I connected with Ashley Freeman a video enabled telemedicine application ([place]) and verified that I am speaking with the correct person using two identifiers.   Discussed confidentiality: Yes   Confirmed demographics & insurance:  Yes   I discussed that engaging in this virtual visit, they consent to the provision of behavioral healthcare and the services will be billed under their insurance.   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    GOALS ADDRESSED: Patient will: 1.  Reduce symptoms of: anxiety, depression, insomnia and stress  2.  Increase knowledge and/or ability of:  coping skills and self-management skills  3.  Demonstrate ability to: Increase healthy adjustment to current life circumstances   Progress of Goals: Ongoing  INTERVENTIONS: Interventions utilized:  Mindfulness or Psychologist, educational, Brief CBT and Psychoeducation and/or Health Education Standardized Assessments completed & reviewed: GAD-7 and PHQ 9   OUTCOME: Patient Response: The client expressed understanding and willingness to adhere to the treatment plan.   PLAN: 1. Follow up with behavioral health clinician on : 03/11/2020 at 10:15AM 2. Behavioral recommendations: Practice coping skills, reach out to support systems, discuss health concerns with diabetes with physician 3. Referral(s): [place] (In Clinic)  I discussed the assessment and treatment plan with the patient and/or parent/guardian. They were provided an opportunity to ask questions and all were answered. They agreed with the plan and demonstrated an understanding of the instructions.   They were advised to call back or seek an in-person evaluation as appropriate.  I discussed that the purpose of this visit is to provide behavioral health care while limiting exposure to the novel coronavirus.  Discussed there is a possibility of technology failure and discussed alternative modes of communication if that failure occurs.  Gertha Calkin (Supervisor: Vesta Mixer)

## 2020-03-07 ENCOUNTER — Other Ambulatory Visit: Payer: Self-pay | Admitting: Internal Medicine

## 2020-03-07 ENCOUNTER — Other Ambulatory Visit: Payer: Self-pay

## 2020-03-07 NOTE — BH Specialist Note (Signed)
Integrated Behavioral Health via Telemedicine Video [facility]) Visit  03/07/2020 Ashley Freeman [id number]  Number of [place] visits: 2 Session Start time: 10:25  Session End time: 10:46 Total time: 21 minutes  Referring Provider: Darron Doom, MD Type of Service: Individual Patient/Family location: Home [facility] Provider location: Center for [employer] at [facility]  All persons participating in visit: Patient Ashley Freeman and [place]     I connected with Alison Kubicki [place]  by a video enabled telemedicine application ([place]) and verified that I am speaking with the correct person using two identifiers.   Discussed confidentiality: Yes   Confirmed demographics & insurance:  Yes   I discussed that engaging in this virtual visit, they consent to the provision of behavioral healthcare and the services will be billed under their insurance.   Patient and/or legal guardian expressed understanding and consented to virtual visit: Yes   PRESENTING CONCERNS: Patient and/or family reports the following symptoms/concerns: Pt states her primary concern today is eye pain post-surgery, along with fatigue, attributed to baby's recent change in sleep. Pt says she is not feeling as depressed and anxious when she keeps busy.  Duration of problem: increase postpartum; Severity of problem: moderate  STRENGTHS (Protective Factors/Coping Skills): Social connections, Social and Emotional competence and Sense of purpose  ASSESSMENT: Patient currently experiencing Adjustment disorder with mixed anxiety and depressed mood.    GOALS ADDRESSED: Patient will: 1.  Reduce symptoms of: anxiety, depression and stress  2.  Increase knowledge and/or ability of: healthy habits and stress reduction  3.  Demonstrate ability to: Increase healthy adjustment to current life circumstances and Increase motivation to  adhere to plan of care   Progress of Goals: Ongoing  INTERVENTIONS: Interventions utilized:  Solution-Focused Strategies and Psychoeducation and/or Health Education Standardized Assessments completed & reviewed: PHQ/GAD given in past 2 weeks   OUTCOME: Patient Response: Pt agrees to treatment plan   PLAN: 1. Follow up with behavioral health clinician on : Two weeks 2. Behavioral recommendations:  -Contact medical provider today to find out additional pain relief strategies; follow medical advice, including taking Ibuprofen or Tylenol, as previous recommendation -Consider using additional self-coping strategies, as discussed 3. Referral(s): [place] (In Clinic)  I discussed the assessment and treatment plan with the patient and/or parent/guardian. They were provided an opportunity to ask questions and all were answered. They agreed with the plan and demonstrated an understanding of the instructions.   They were advised to call back or seek an in-person evaluation as appropriate.  I discussed that the purpose of this visit is to provide behavioral health care while limiting exposure to the novel coronavirus.  Discussed there is a possibility of technology failure and discussed alternative modes of communication if that failure occurs.  [name] [facility]  Depression screen [facility] 2/9 03/02/2020 01/19/2020 05/20/2019 05/06/2019 12/10/2018  Decreased Interest 3 2 2  0 2  Down, Depressed, Hopeless 1 3 0 0 2  PHQ - 2 Score 4 5 2  0 4  Altered sleeping 3 2 2  0 2  Tired, decreased energy 1 2 2  0 3  Change in appetite 3 1 1  0 1  Feeling bad or failure about yourself  1 3 0 0 1  Trouble concentrating 0 0 0 0 0  Moving slowly or fidgety/restless 2 0 0 0 0  Suicidal thoughts 0 0 0 0 0  PHQ-9 Score 14 13 7  0 11  Some recent data might  be hidden   GAD 7 : Generalized Anxiety Score 03/02/2020 05/20/2019 05/06/2019 12/10/2018  Nervous, Anxious, on Edge 2 1 0 1  Control/stop  worrying 2 0 1 0  Worry too much - different things 3 1 1 1   Trouble relaxing 1 2 1 2   Restless 3 0 0 1  Easily annoyed or irritable 1 2 1 2   Afraid - awful might happen 1 0 1 0  Total GAD 7 Score 13 6 5  7

## 2020-03-11 ENCOUNTER — Other Ambulatory Visit: Payer: Self-pay

## 2020-03-11 ENCOUNTER — Encounter: Payer: Self-pay | Admitting: Internal Medicine

## 2020-03-11 ENCOUNTER — Ambulatory Visit: Payer: Medicaid Other | Admitting: Clinical

## 2020-03-11 ENCOUNTER — Ambulatory Visit (INDEPENDENT_AMBULATORY_CARE_PROVIDER_SITE_OTHER): Payer: BLUE CROSS/BLUE SHIELD | Admitting: Internal Medicine

## 2020-03-11 VITALS — BP 122/80 | HR 96 | Ht 63.0 in | Wt 134.5 lb

## 2020-03-11 DIAGNOSIS — E1065 Type 1 diabetes mellitus with hyperglycemia: Secondary | ICD-10-CM

## 2020-03-11 DIAGNOSIS — F4323 Adjustment disorder with mixed anxiety and depressed mood: Secondary | ICD-10-CM

## 2020-03-11 LAB — POCT GLYCOSYLATED HEMOGLOBIN (HGB A1C): Hemoglobin A1C: 7.2 % — AB (ref 4.0–5.6)

## 2020-03-11 MED ORDER — OMNIPOD DASH PODS (GEN 4) MISC
3 refills | Status: DC
Start: 2020-03-11 — End: 2020-07-04

## 2020-03-11 NOTE — Progress Notes (Signed)
Name: Ashley Freeman  Age/ Sex: 29 y.o., female   MRN/ DOB: [id number], 19-May-1990     PCP: Verlee Rossetti, PA-C   Reason for Endocrinology Evaluation: Type 2 Diabetes Mellitus  Initial Endocrine Consultative Visit: 06/27/18    PATIENT IDENTIFIER: [name] is a 29 y.o. female with a past medical history of T1DM with multiple DKA's. The patient has followed with Endocrinology clinic since 06/27/2018 for consultative assistance with management of her diabetes.  DIABETIC HISTORY:  Ms. Twedt was diagnosed with T1DM at age 89. Hx of multiple DKA's due to non-compliance. Her hemoglobin A1c has ranged from  in 11.3% in 2011, peaking at 19.3% in 2015.  S/P C-section 10/2019 ( [name])  SUBJECTIVE:   During the last visit (10/20/2019): A1c 6.8% .  We adjusted pump settings  Today (03/11/2020): Ms. Marchioni is here for a follow up on diabetes management .   Checks glucose multiple times a day through [facility] She had a cataract sx recently.   Has noted hyperglycemia recently that she attributes to running our of Omnipod pods, the pharmacy has only been giving her 1 box at a time, even though the prescription is written for 6    She has an upcoming appointment with Nephrology    HOME DIABETES REGIMEN:   Novolog     This patient with type 1 diabetes is treated with Omnipod (insulin pump). During the visit the pump basal and bolus doses were reviewed including carb/insulin rations and supplemental doses. The clinical list was updated. The glucose meter download was reviewed in detail to determine if the current pump settings are providing the best glycemic control without excessive hypoglycemia.  Pump and meter download:    Pump   Omnipod Settings   Insulin type   NOVOLOG    Basal rate       0000 0.50              I:C ratio       0000 14              AIT  4 hrs        Sensitivity       0000  65        Goal       0000  100             Type & Model of Pump: Omnipod Insulin Type: Currently using Novolog.  Body mass index is 23.83 kg/m.  PUMP STATISTICS: Average BG: 245 Average Daily Carbs (g): 138 Average Total Daily Insulin: 26.7 Average Daily Basal: 9 u (39 %) Average Daily Bolus: 13.9 (61%)      CONTINUOUS GLUCOSE MONITORING RECORD INTERPRETATION: Forgot the receiver     DIABETIC COMPLICATIONS: Microvascular complications:    Denies: neuropathy, retinopathy, CKD  Last eye exam: Completed 02/2020   Macrovascular complications:    Denies: CAD, PVD, CVA  HISTORY:  Past Medical History:  Past Medical History:  Diagnosis Date  . Abscess, gluteal, right 08/24/2013  . AKI (acute kidney injury) (HCC) 07/26/2014  . Anemia 02/19/2012  . Bartholin's gland abscess 09/19/2013  . BV (bacterial vaginosis) 11/24/2015  . Diabetes mellitus type I (HCC) 2001   Diagnosed at age 27 ; Type I  . Diarrhea 05/30/2016  . DKA (diabetic ketoacidoses) 08/19/2013   Also in 2018  . Gonorrhea 08/2011   Treated in 09/2011  . History of trichomoniasis 05/31/2016  . Hyperlipidemia 03/28/2016  .  Sepsis (HCC) 09/19/2013   Past Surgical History:  Past Surgical History:  Procedure Laterality Date  . CESAREAN SECTION N/A 10/05/2019   Procedure: CESAREAN SECTION;  Surgeon: [place] Bing, MD;  Location: [facility] LD ORS;  Service: Obstetrics;  Laterality: N/A;  . INCISION AND DRAINAGE ABSCESS Left 09/28/2019   Procedure: INCISION AND DRAINAGE VULVAR ABCESS;  Surgeon: Tilda Burrow, MD;  Location: [facility] OR;  Service: Gynecology;  Laterality: Left;  . INCISION AND DRAINAGE PERIRECTAL ABSCESS Right 08/18/2013   Procedure: IRRIGATION AND DEBRIDEMENT GLUTEAL ABSCESS;  Surgeon: Axel Filler, MD;  Location: [facility] OR;  Service: General;  Laterality: Right;  . INCISION AND DRAINAGE PERIRECTAL ABSCESS Right 09/19/2013   Procedure: IRRIGATION AND DEBRIDEMENT RIGHT GLUTEAL AND LABIAL ABSCESSES;  Surgeon:  Axel Filler, MD;  Location: [facility] OR;  Service: General;  Laterality: Right;  . INCISION AND DRAINAGE PERIRECTAL ABSCESS Right 09/24/2013   Procedure: IRRIGATION AND DEBRIDEMENT PERIRECTAL ABSCESS;  Surgeon: Cherylynn Ridges, MD;  Location: [facility] OR;  Service: General;  Laterality: Right;    Social History:  reports that she has never smoked. She has never used smokeless tobacco. She reports previous alcohol use. She reports that she does not use drugs. Family History:  Family History  Problem Relation Age of Onset  . Asthma Mother   . Carpal tunnel syndrome Mother   . Gout Father   . Diabetes Paternal Grandmother   . Anesthesia problems Neg Hx      HOME MEDICATIONS: Allergies as of 03/11/2020      Reactions   Penicillins Hives, Rash   Has patient had a PCN reaction causing immediate rash, facial/tongue/throat swelling, SOB or lightheadedness with hypotension: Yes Has patient had a PCN reaction causing severe rash involving mucus membranes or skin necrosis: No Has patient had a PCN reaction that required hospitalization: Yes Has patient had a PCN reaction occurring within the last 10 years: No If all of the above answers are "NO", then may proceed with Cephalosporin use.   Benadryl [diphenhydramine] Itching   Doxycycline Itching      Medication List       Accurate as of March 11, 2020  2:35 PM. If you have any questions, ask your nurse or doctor.        blood glucose meter kit and supplies Kit Dispense based on patient and insurance preference. Use up to four times daily as directed. (FOR ICD-9 250.00, 250.01).   Blood Pressure Kit Devi 1 Device by Does not apply route as needed.   Dexcom G6 Sensor Misc USE AS DIRECTED. CHANGE SENSOR EVERY 10 DAYS   Dexcom G6 Transmitter Misc Use one transmitter once every 90 days.   hydrochlorothiazide 25 MG tablet Commonly known as: HYDRODIURIL Take 1 tablet (25 mg total) by mouth daily.   insulin aspart 100 UNIT/ML  injection Commonly known as: NovoLOG Use via insulin pump daily. Max daily 30 units   Iron 325 (65 Fe) MG Tabs Take 1 tablet (325 mg total) by mouth every other day.   medroxyPROGESTERone 150 MG/ML injection Commonly known as: DEPO-PROVERA Inject 1 mL (150 mg total) into the muscle every 3 (three) months.   OmniPod Dash 5 Pack Pods Misc USE 1 AS DIRECTED DAILY   PrePLUS 27-1 MG Tabs Take 1 tablet by mouth daily.        OBJECTIVE:   PHYSICAL EXAM: VS:BP 122/80   Pulse 96   Ht 5\' 3"  (1.6 m)   Wt 134 lb 8 oz (61 kg)  SpO2 97%   BMI 23.83 kg/m   BP 122/80   Pulse 96   Ht 5\' 3"  (1.6 m)   Wt 134 lb 8 oz (61 kg)   SpO2 97%   BMI 23.83 kg/m    EXAM: General: Pt appears well and is in NAD  Lungs: Clear with good BS bilat with no rales, rhonchi, or wheezes  Heart: RRR  Abdomen:   soft and non-tender  Extremities: No pretibial edema   Mental Status: Judgment, insight: intact Mood and affect: no depression, anxiety, or agitation     DM foot exam: 04/08/2019 The skin of the feet is intact without sores or ulcerations. The pedal pulses are 2+ on right and 2+ on left. The sensation is intact to a screening 5.07, 10 gram monofilament bilaterally     DATA REVIEWED:  Lab Results  Component Value Date   HGBA1C 7.2 (A) 03/11/2020   HGBA1C 6.8 (H) 09/27/2019   HGBA1C 6.4 (A) 08/13/2019     Lab Results  Component Value Date   MICRALBCREAT 115.3 (H) 04/08/2019   Results for CHERYCE, GRANDSTAFF (MRN [id number]) as of 03/11/2020 14:27  Ref. Range 02/02/2020 12:41  Sodium Latest Ref Range: 135 - 145 mmol/L 140  Potassium Latest Ref Range: 3.5 - 5.1 mmol/L 4.6  Chloride Latest Ref Range: 98 - 111 mmol/L 111  CO2 Latest Ref Range: 22 - 32 mmol/L 17 (L)  Glucose Latest Ref Range: 70 - 99 mg/dL 621 (H)  BUN Latest Ref Range: 6 - 20 mg/dL 22 (H)  Creatinine Latest Ref Range: 0.44 - 1.00 mg/dL 3.08 (H)  Calcium Latest Ref Range: 8.9 - 10.3 mg/dL 9.5  Anion gap  Latest Ref Range: 5 - 15  12  Alkaline Phosphatase Latest Ref Range: 38 - 126 U/L 44  Albumin Latest Ref Range: 3.5 - 5.0 g/dL 3.7  Lipase Latest Ref Range: 11 - 51 U/L 19  AST Latest Ref Range: 15 - 41 U/L 17  ALT Latest Ref Range: 0 - 44 U/L 14  Total Protein Latest Ref Range: 6.5 - 8.1 g/dL 7.8  Total Bilirubin Latest Ref Range: 0.3 - 1.2 mg/dL 0.6  GFR, Est Non African American Latest Ref Range: >60 mL/min 54 (L)   03/08/2020 MA/Cr ratio 2626    ASSESSMENT / PLAN / RECOMMENDATIONS:   1) Type 1 Diabetes Mellitus,Sub-  Optimally controlled, With microalbuminuria - Most recent A1c of 7.2 %. Goal A1c < 6.5%.    - Slight worsening of her glycemic control, she has been running out of pods. Not sure why the pharmacy give her one box at a time, will adjust the prescription   - I will increase her basal rate and adjust I" C ratio as below   MEDICATIONS: Novolog through the Omnipod  Pump   Omnipod Settings   Basal rate       0000 0.55              I:C ratio       0000 12          Sensitivity       0000  65      Goal       0000  100    EDUCATION / INSTRUCTIONS:  BG monitoring instructions: Patient is instructed to check her blood sugars 4 times a day, before meals and bedtime   Call [facility] if: BG persistently < 70 . I reviewed the Rule of 15 for the treatment of  hypoglycemia in detail with the patient. Literature supplied.   2) Diabetic complications:   Eye: Does not have known diabetic retinopathy.   Neuro/ Feet: Does not have known diabetic peripheral neuropathy.  Renal: Patient does not have known baseline CKD. She is not on an ACEI/ARB at present.but has microalbuminuria, has a pending referral to nephrology per pt.    F/U in 4 months      Signed electronically by: Lyndle Herrlich, MD  [facility]  [facility] [address] [place] [place], [place] [address] Phone: [phone] FAX:  [phone]   CC: Verlee Rossetti, PA-C [id number] [place] [address] Phone: [phone]  Fax: [phone]  Return to Endocrinology clinic as below: Future Appointments  Date Time Provider Department Center  03/29/2020  1:15 PM [facility] HEALTH CLINICIAN [facility]  04/05/2020 11:00 AM [facility] NURSE [facility]  07/13/2020  1:00 PM Shayna Eblen, Konrad Dolores, MD [facility] None

## 2020-03-11 NOTE — Patient Instructions (Signed)
[facility] [address] [place], [place] [address] [phone] (main office) [phone] (Julyssa Kyer's office)  /Emotional Wellbeing Apps and Websites Here are a few free apps meant to help you to help yourself.  To find, try searching on the internet to see if the app is offered on Apple/Android devices. If your first choice doesn't come up on your device, the good news is that there are many choices! Play around with different apps to see which ones are helpful to you.    Calm This is an app meant to help increase calm feelings. Includes info, strategies, and tools for tracking your feelings.      Calm Harm  This app is meant to help with self-harm. Provides many 5-minute or 15-min coping strategies for doing instead of hurting yourself.       Healthy Minds Health Minds is a problem-solving tool to help deal with emotions and cope with stress you encounter wherever you are.      MindShift This app can help people cope with anxiety. Rather than trying to avoid anxiety, you can make an important shift and face it.      MY3  MY3 features a support system, safety plan and resources with the goal of offering a tool to use in a time of need.       My Life My Voice  This mood journal offers a simple solution for tracking your thoughts, feelings and moods. Animated emoticons can help identify your mood.       Relax Melodies Designed to help with sleep, on this app you can mix sounds and meditations for relaxation.      Smiling Mind Smiling Mind is meditation made easy: it's a simple tool that helps put a smile on your mind.        Stop, Breathe & Think  A friendly, simple guide for people through meditations for mindfulness and compassion.  Stop, Breathe and Think Kids Enter your current feelings and choose a "mission" to help you cope. Offers videos for certain moods instead of just sound recordings.       Team  Orange The goal of this tool is to help teens change how they think, act, and react. This app helps you focus on your own good feelings and experiences.      The Virtual Hope Box The Virtual Hope Box (VHB) contains simple tools to help patients with coping, relaxation, distraction, and positive thinking.     

## 2020-03-11 NOTE — Patient Instructions (Signed)
Pump   Omnipod Settings   Basal rate       0000 0.55              I:C ratio       0000 12                  Sensitivity       0000  65      Goal       0000  100

## 2020-03-21 NOTE — BH Specialist Note (Deleted)
Integrated Behavioral Health via Telemedicine Visit  03/21/2020 Ashley Freeman [id number]  Number of [place] visits: *** Session Start time: 1:15***  Session End time: 1:45*** Total time: {IBH Total Time:21014050}  Referring Provider: *** Patient/Family location: Home*** [facility] Provider location: [facility] [place] [facility]  All persons participating in visit: Patient *** and Ashley Freeman ***  Types of Service: {CHL AMB TYPE OF SERVICE:[id number]}  I connected with Ashley Freeman and/or Ashley Freeman's {family members:20773} by {CHL AMB IBH TELEMEDICINE [phone]} and verified that I am speaking with the correct person using two identifiers.    Discussed confidentiality: {YES/NO:21197}  I discussed the limitations of telemedicine and the availability of in person appointments.  Discussed there is a possibility of technology failure and discussed alternative modes of communication if that failure occurs.  I discussed that engaging in this telemedicine visit, they consent to the provision of behavioral healthcare and the services will be billed under their insurance.  Patient and/or legal guardian expressed understanding and consented to Telemedicine visit: {YES/NO:21197}  Presenting Concerns: Patient and/or family reports the following symptoms/concerns: *** Duration of problem: ***; Severity of problem: {Mild/Moderate/Severe:20260}  Patient and/or Family's Strengths/Protective Factors: {CHL AMB BH PROTECTIVE [phone]}  Goals Addressed: Patient will: 1.  Reduce symptoms of: {IBH Symptoms:21014056}  2.  Increase knowledge and/or ability of: {IBH Patient Tools:21014057}  3.  Demonstrate ability to: {IBH Goals:21014053}  Progress towards Goals: {CHL AMB BH PROGRESS TOWARDS GOALS:[phone]}  Interventions: Interventions utilized:  {IBH  Interventions:21014054} Standardized Assessments completed: {IBH Screening Tools:21014051}  Patient and/or Family Response: ***  Assessment: Patient currently experiencing ***.   Patient may benefit from ***.  Plan: 1. Follow up with behavioral health clinician on : *** 2. Behavioral recommendations: *** 3. Referral(s): {IBH Referrals:21014055}  I discussed the assessment and treatment plan with the patient and/or parent/guardian. They were provided an opportunity to ask questions and all were answered. They agreed with the plan and demonstrated an understanding of the instructions.   They were advised to call back or seek an in-person evaluation if the symptoms worsen or if the condition fails to improve as anticipated.  Ashley Hamman Clova Morlock, LCSW

## 2020-03-29 ENCOUNTER — Other Ambulatory Visit: Payer: Self-pay

## 2020-04-05 ENCOUNTER — Ambulatory Visit: Payer: BLUE CROSS/BLUE SHIELD

## 2020-05-17 ENCOUNTER — Telehealth: Payer: Self-pay | Admitting: Internal Medicine

## 2020-05-17 NOTE — Telephone Encounter (Signed)
Patient requests to be called at ph# [phone] re: Patient has questions about what she should do  (medications) since Patient has Covid 19 (tested positive for Covid 19 on 05/07/20)

## 2020-05-17 NOTE — Telephone Encounter (Signed)
Spoken to patient and she stated that her blood sugar have been low like 65. Will print reading from [facility] for Dr Kelton Pillar to review.  Patient stated that she really have SOB and congestions. Suggested patient to contact pcp or if symptoms gets worse to go to ED/urgent Care.

## 2020-05-18 NOTE — Telephone Encounter (Signed)
Message left for patient to return my call.  

## 2020-05-18 NOTE — Telephone Encounter (Signed)
PLease ask the pt to reduce her basal rate from 0.55 to 0.50 u/hr    Thanks

## 2020-05-19 NOTE — Telephone Encounter (Signed)
Message left for patient to return my call.  

## 2020-05-20 NOTE — Telephone Encounter (Signed)
Message left for patient to return my call.  

## 2020-05-20 NOTE — Telephone Encounter (Signed)
Send a message on MyChart

## 2020-05-23 NOTE — Telephone Encounter (Signed)
Sent letter with the information below

## 2020-06-10 ENCOUNTER — Other Ambulatory Visit: Payer: Self-pay | Admitting: Internal Medicine

## 2020-06-20 ENCOUNTER — Other Ambulatory Visit: Payer: Self-pay | Admitting: *Deleted

## 2020-06-20 ENCOUNTER — Telehealth: Payer: Self-pay | Admitting: Internal Medicine

## 2020-06-20 MED ORDER — INSULIN ASPART 100 UNIT/ML ~~LOC~~ SOLN: SUBCUTANEOUS | 3 refills | Status: DC

## 2020-06-20 NOTE — Telephone Encounter (Signed)
Rx sent 

## 2020-06-20 NOTE — Telephone Encounter (Signed)
MEDICATION: insulin apart  PHARMACY:  [facility]process development [facility] at [facility] CONTACTED [facility]?  Yes   IS THIS A 90 DAY SUPPLY :   IS PATIENT OUT OF MEDICATION: yes  IF NOT; HOW MUCH IS LEFT:   LAST APPOINTMENT DATE: '@2'$ /02/2021  NEXT APPOINTMENT DATE:'@3'$ /16/2022  DO WE HAVE YOUR PERMISSION TO LEAVE A DETAILED MESSAGE?: yes  OTHER COMMENTS:    **Let patient know to contact pharmacy at the end of the day to make sure medication is ready. **  ** Please notify patient to allow 48-72 hours to process**  **Encourage patient to contact the pharmacy for refills or they can request refills through [facility]**

## 2020-07-02 ENCOUNTER — Other Ambulatory Visit: Payer: Self-pay | Admitting: Internal Medicine

## 2020-07-13 ENCOUNTER — Ambulatory Visit: Payer: BLUE CROSS/BLUE SHIELD | Admitting: Internal Medicine

## 2020-07-14 ENCOUNTER — Other Ambulatory Visit: Payer: Self-pay | Admitting: Internal Medicine

## 2020-07-15 ENCOUNTER — Other Ambulatory Visit: Payer: Self-pay

## 2020-07-15 ENCOUNTER — Encounter: Payer: Self-pay | Admitting: Internal Medicine

## 2020-07-15 ENCOUNTER — Ambulatory Visit (INDEPENDENT_AMBULATORY_CARE_PROVIDER_SITE_OTHER): Payer: BLUE CROSS/BLUE SHIELD | Admitting: Internal Medicine

## 2020-07-15 VITALS — BP 120/78 | HR 108 | Ht 63.0 in | Wt 128.0 lb

## 2020-07-15 DIAGNOSIS — E1021 Type 1 diabetes mellitus with diabetic nephropathy: Secondary | ICD-10-CM | POA: Diagnosis not present

## 2020-07-15 DIAGNOSIS — N1831 Chronic kidney disease, stage 3a: Secondary | ICD-10-CM

## 2020-07-15 DIAGNOSIS — E1065 Type 1 diabetes mellitus with hyperglycemia: Secondary | ICD-10-CM

## 2020-07-15 LAB — BASIC METABOLIC PANEL
BUN: 21 mg/dL (ref 6–23)
CO2: 23 mEq/L (ref 19–32)
Calcium: 9.1 mg/dL (ref 8.4–10.5)
Chloride: 106 mEq/L (ref 96–112)
Creatinine, Ser: 1.46 mg/dL — ABNORMAL HIGH (ref 0.40–1.20)
GFR: 48.82 mL/min — ABNORMAL LOW (ref >60.00)
Glucose, Bld: 126 mg/dL — ABNORMAL HIGH (ref 70–99)
Potassium: 4.6 mEq/L (ref 3.5–5.1)
Sodium: 136 mEq/L (ref 135–145)

## 2020-07-15 LAB — MICROALBUMIN / CREATININE URINE RATIO
Creatinine,U: 153.2 mg/dL
Microalb Creat Ratio: 202.2 mg/g — ABNORMAL HIGH (ref 0.0–30.0)
Microalb, Ur: 309.7 mg/dL — ABNORMAL HIGH (ref 0.0–1.9)

## 2020-07-15 LAB — LIPID PANEL
Cholesterol: 220 mg/dL — ABNORMAL HIGH (ref 0–200)
HDL: 43.9 mg/dL (ref >39.00)
LDL Cholesterol: 141 mg/dL — ABNORMAL HIGH (ref 0–99)
NonHDL: 175.94
Total CHOL/HDL Ratio: 5
Triglycerides: 174 mg/dL — ABNORMAL HIGH (ref 0.0–149.0)
VLDL: 34.8 mg/dL (ref 0.0–40.0)

## 2020-07-15 LAB — POCT GLYCOSYLATED HEMOGLOBIN (HGB A1C): Hemoglobin A1C: 9.4 % — AB (ref 4.0–5.6)

## 2020-07-15 LAB — TSH: TSH: 2.62 u[IU]/mL (ref 0.35–4.50)

## 2020-07-15 MED ORDER — OMNIPOD DASH PODS (GEN 4) MISC
3 refills | Status: DC
Start: 2020-07-15 — End: 2020-10-07

## 2020-07-15 NOTE — Progress Notes (Signed)
Name: Ashley Freeman  Age/ Sex: 30 y.o., female   MRN/ DOB: [id number], 08-Oct-1990     PCP: [name], PA-C   Reason for Endocrinology Evaluation: Type 2 Diabetes Mellitus  Initial Endocrine Consultative Visit: 06/27/18    PATIENT IDENTIFIER: [name] is a 30 y.o. female with a past medical history of T1DM with multiple DKA's. The patient has followed with Endocrinology clinic since 06/27/2018 for consultative assistance with management of her diabetes.  DIABETIC HISTORY:  [name] was diagnosed with T1DM at age 36. Hx of multiple DKA's due to non-compliance. Her hemoglobin A1c has ranged from  in 11.3% in 2011, peaking at 19.3% in 2015.  S/P C-section 10/2019 ( [name])  SUBJECTIVE:   During the last visit (03/11/2020): A1c 7.2 % .  We adjusted pump settings   Today (07/15/2020): [name] is here for a follow up on diabetes management .   Checks glucose multiple times a day through [facility] She has an upcoming appointment with Nephrology in April Denies nausea or diarrhea    HOME DIABETES REGIMEN:   Novolog     This patient with type 1 diabetes is treated with Omnipod (insulin pump). During the visit the pump basal and bolus doses were reviewed including carb/insulin rations and supplemental doses. The clinical list was updated. The glucose meter download was reviewed in detail to determine if the current pump settings are providing the best glycemic control without excessive hypoglycemia.  Pump and meter download:    Pump   Omnipod Settings   Insulin type   NOVOLOG    Basal rate       0000 0.5              I:C ratio       0000 12              AIT  4 hrs        Sensitivity       0000  65       Goal       0000  100             Type & Model of Pump: Omnipod Insulin Type: Currently using Novolog.  Body mass index is 22.67 kg/m.  PUMP STATISTICS: Average BG: 380 Average  Daily Carbs (g): 86 Average Total Daily Insulin: 18.1 Average Daily Basal: 7.3 u (57 %) Average Daily Bolus: 5.6 (43%)    CONTINUOUS GLUCOSE MONITORING RECORD INTERPRETATION    Dates of Recording: 3/5-3/18/2022  Sensor description:dexcom  Results statistics:   CGM use % of time 64  Average and SD 265/111  Time in range    30    %  % Time Above 180 17  % Time above 250 51  % Time Below target <1    Glycemic patterns summary:  Hyperglycemia all day and most of the night   Hyperglycemic episodes  During the day   Hypoglycemic episodes occurred sporadic and no pattern   Overnight periods: trends down       DIABETIC COMPLICATIONS: Microvascular complications:    Denies: neuropathy, retinopathy, CKD  Last eye exam: Completed 02/2020   Macrovascular complications:    Denies: CAD, PVD, CVA  HISTORY:  Past Medical History:  Past Medical History:  Diagnosis Date  . Abscess, gluteal, right 08/24/2013  . AKI (acute kidney injury) ([place]) 07/26/2014  . Anemia 02/19/2012  . Bartholin's gland abscess 09/19/2013  . BV (  bacterial vaginosis) 11/24/2015  . Diabetes mellitus type I ([place]) 2001   Diagnosed at age 78 ; Type I  . Diarrhea 05/30/2016  . DKA (diabetic ketoacidoses) 08/19/2013   Also in 2018  . Gonorrhea 08/2011   Treated in 09/2011  . History of trichomoniasis 05/31/2016  . Hyperlipidemia 03/28/2016  . Sepsis ([place]) 09/19/2013   Past Surgical History:  Past Surgical History:  Procedure Laterality Date  . CESAREAN SECTION N/A 10/05/2019   Procedure: CESAREAN SECTION;  Surgeon: Aletha Halim, MD;  [facility];  Service: Obstetrics;  Laterality: N/A;  . INCISION AND DRAINAGE ABSCESS Left 09/28/2019   Procedure: INCISION AND DRAINAGE VULVAR ABCESS;  Surgeon: [name], MD;  Location: [place];  Service: Gynecology;  Laterality: Left;  . INCISION AND DRAINAGE PERIRECTAL ABSCESS Right 08/18/2013   Procedure: IRRIGATION AND DEBRIDEMENT GLUTEAL ABSCESS;   Surgeon: Ralene Ok, MD;  Location: [place];  Service: General;  Laterality: Right;  . INCISION AND DRAINAGE PERIRECTAL ABSCESS Right 09/19/2013   Procedure: IRRIGATION AND DEBRIDEMENT RIGHT GLUTEAL AND LABIAL ABSCESSES;  Surgeon: Ralene Ok, MD;  Location: [place];  Service: General;  Laterality: Right;  . INCISION AND DRAINAGE PERIRECTAL ABSCESS Right 09/24/2013   Procedure: IRRIGATION AND DEBRIDEMENT PERIRECTAL ABSCESS;  Surgeon: Gwenyth Ober, MD;  Location: [place];  Service: General;  Laterality: Right;    Social History:  reports that she has never smoked. She has never used smokeless tobacco. She reports previous alcohol use. She reports that she does not use drugs. Family History:  Family History  Problem Relation Age of Onset  . Asthma Mother   . Carpal tunnel syndrome Mother   . Gout Father   . Diabetes Paternal Grandmother   . Anesthesia problems Neg Hx      HOME MEDICATIONS: Allergies as of 07/15/2020      Reactions   Penicillins Hives, Rash   Has patient had a PCN reaction causing immediate rash, facial/tongue/throat swelling, SOB or lightheadedness with hypotension: Yes Has patient had a PCN reaction causing severe rash involving mucus membranes or skin necrosis: No Has patient had a PCN reaction that required hospitalization: Yes Has patient had a PCN reaction occurring within the last 10 years: No If all of the above answers are "NO", then may proceed with Cephalosporin use.   Benadryl [diphenhydramine] Itching   Doxycycline Itching      Medication List       Accurate as of July 15, 2020  2:10 PM. If you have any questions, ask your nurse or doctor.        blood glucose meter kit and supplies Kit Dispense based on patient and insurance preference. Use up to four times daily as directed. (FOR ICD-9 250.00, 250.01).   Blood Pressure Kit Devi 1 Device by Does not apply route as needed.   Dexcom G6 Sensor Misc USE AS DIRECTED, CHANGE SENSOR EVERY 10 DAYS    Dexcom G6 Transmitter Misc USE ONE TRANSMITTER ONCE EVERY 90 DAYS   hydrochlorothiazide 25 MG tablet Commonly known as: HYDRODIURIL Take 1 tablet (25 mg total) by mouth daily.   insulin aspart 100 UNIT/ML injection Commonly known as: NovoLOG Use via insulin pump daily. Max daily 30 units   Iron 325 (65 Fe) MG Tabs Take 1 tablet (325 mg total) by mouth every other day.   medroxyPROGESTERone 150 MG/ML injection Commonly known as: DEPO-PROVERA Inject 1 mL (150 mg total) into the muscle every 3 (three) months.   OmniPod Dash 5  Pack Pods Misc USE 1 AS DIRECTED DAILY.   PrePLUS 27-1 MG Tabs Take 1 tablet by mouth daily.        OBJECTIVE:   PHYSICAL EXAM: VS:BP 120/78   Pulse (!) 108   Ht _0  (1.6 m)   Wt 128 lb (58.1 kg)   LMP  (LMP Unknown)   SpO2 98%   BMI 22.67 kg/m   BP 120/78   Pulse (!) 108   Ht _1  (1.6 m)   Wt 128 lb (58.1 kg)   LMP  (LMP Unknown)   SpO2 98%   BMI 22.67 kg/m    EXAM: General: Pt appears well and is in NAD  Lungs: Clear with good BS bilat with no rales, rhonchi, or wheezes  Heart: RRR  Abdomen:   soft and non-tender  Extremities: No pretibial edema   Mental Status: Judgment, insight: intact Mood and affect: no depression, anxiety, or agitation     DM foot exam: 07/15/2020 The skin of the feet is intact without sores or ulcerations. The pedal pulses are 2+ on right and 2+ on left. The sensation is intact to a screening 5.07, 10 gram monofilament bilaterally     DATA REVIEWED:  Lab Results  Component Value Date   HGBA1C 7.2 (A) 03/11/2020   HGBA1C 6.8 (H) 09/27/2019   HGBA1C 6.4 (A) 08/13/2019     Results for CHARDA, JANIS (MRN [id number]) as of 07/18/2020 09:16  Ref. Range 07/15/2020 14:31  Sodium Latest Ref Range: 135 - 145 mEq/L 136  Potassium Latest Ref Range: 3.5 - 5.1 mEq/L 4.6  Chloride Latest Ref Range: 96 - 112 mEq/L 106  CO2 Latest Ref Range: 19 - 32 mEq/L 23  Glucose Latest Ref Range: 70 -  99 mg/dL 126 (H)  BUN Latest Ref Range: 6 - 23 mg/dL 21  Creatinine Latest Ref Range: 0.40 - 1.20 mg/dL 1.46 (H)  Calcium Latest Ref Range: 8.4 - 10.5 mg/dL 9.1  GFR Latest Ref Range: >60.00 mL/min 48.82 (L)  Total CHOL/HDL Ratio Unknown 5  Cholesterol Latest Ref Range: 0 - 200 mg/dL 220 (H)  HDL Cholesterol Latest Ref Range: >39.00 mg/dL 43.90  LDL (calc) Latest Ref Range: 0 - 99 mg/dL 141 (H)  MICROALB/CREAT RATIO Latest Ref Range: 0.0 - 30.0 mg/g 202.2 (H)  NonHDL Unknown 175.94  Triglycerides Latest Ref Range: 0.0 - 149.0 mg/dL 174.0 (H)  VLDL Latest Ref Range: 0.0 - 40.0 mg/dL 34.8  TSH Latest Ref Range: 0.35 - 4.50 uIU/mL 2.62  Creatinine,U Latest Units: mg/dL 153.2  Microalb, Ur Latest Ref Range: 0.0 - 1.9 mg/dL 309.7 (H)     ASSESSMENT / PLAN / RECOMMENDATIONS:   1) Type 1 Diabetes Mellitus, Poorly  controlled, With CKD III - Most recent A1c of 9.4 %. Goal A1c < 6.5%.    - Pt with hyperglycemia, she attributes this to running out of pods and not having enough refill from the pharmacy.  - Pt with scattered self care. She has been bolusing once a day on average , days missed from CGM. She is not entering data from CGM to Omnipod.  - I am going to increase her basal rate during the day, I have encouraged her to enter glucose data to the Omnipod to have more information about I:C ratio and SF . At this time , data is limited and I am unable to change I:C ratio of SF  - I have encouraged her to use contraception    MEDICATIONS: Novolog  through the [facility]   Basal rate       0000-0800 0.50   0800- 0000 0.60          I:C ratio       0000 12          Sensitivity       0000  65      Goal       0000  100    EDUCATION / INSTRUCTIONS:  BG monitoring instructions: Patient is instructed to check her blood sugars 4 times a day, before meals and bedtime   Call [facility] if: BG persistently < 70 . I reviewed the Rule of 15 for  the treatment of hypoglycemia in detail with the patient. Literature supplied.   2) Diabetic complications:   Eye: Does not have known diabetic retinopathy.   Neuro/ Feet: Does not have known diabetic peripheral neuropathy.  Renal: Patient does not have known baseline CKD. She is not on an ACEI/ARB at present.but has microalbuminuria, has a pending referral to nephrology per pt.   3) CKD III   - GFR continues to fluctuate but is low. She has an appointment with Nephrology. Pt will be encouraged to keep up with the appointment.    F/U in 4 months      Signed electronically by: Mack Guise, MD  [facility]  [facility] [address] [place], [place] [address] Phone: [phone] FAX: [phone]   CC: [name], PA-C [address] [address] Phone: [phone]  Fax: [phone]  Return to Endocrinology clinic as below: No future appointments.

## 2020-07-18 DIAGNOSIS — N1831 Chronic kidney disease, stage 3a: Secondary | ICD-10-CM | POA: Insufficient documentation

## 2020-07-21 ENCOUNTER — Other Ambulatory Visit: Payer: Self-pay | Admitting: Internal Medicine

## 2020-07-25 ENCOUNTER — Other Ambulatory Visit: Payer: Self-pay

## 2020-07-25 ENCOUNTER — Emergency Department (HOSPITAL_COMMUNITY)
Admission: EM | Admit: 2020-07-25 | Discharge: 2020-07-25 | Disposition: A | Discharge status: Home / Self Care | Payer: BLUE CROSS/BLUE SHIELD | Attending: Emergency Medicine | Admitting: Emergency Medicine

## 2020-07-25 DIAGNOSIS — J029 Acute pharyngitis, unspecified: Secondary | ICD-10-CM

## 2020-07-25 DIAGNOSIS — Z794 Long term (current) use of insulin: Secondary | ICD-10-CM | POA: Diagnosis not present

## 2020-07-25 DIAGNOSIS — E1010 Type 1 diabetes mellitus with ketoacidosis without coma: Secondary | ICD-10-CM | POA: Insufficient documentation

## 2020-07-25 LAB — CBC WITH DIFFERENTIAL/PLATELET
Abs Immature Granulocytes: 0.03 10*3/uL (ref 0.00–0.07)
Basophils Absolute: 0 10*3/uL (ref 0.0–0.1)
Basophils Relative: 0 %
Eosinophils Absolute: 0.2 10*3/uL (ref 0.0–0.5)
Eosinophils Relative: 2 %
HCT: 29.2 % — ABNORMAL LOW (ref 36.0–46.0)
Hemoglobin: 9.4 g/dL — ABNORMAL LOW (ref 12.0–15.0)
Immature Granulocytes: 0 %
Lymphocytes Relative: 30 %
Lymphs Abs: 2.7 10*3/uL (ref 0.7–4.0)
MCH: 27.2 pg (ref 26.0–34.0)
MCHC: 32.2 g/dL (ref 30.0–36.0)
MCV: 84.6 fL (ref 80.0–100.0)
Monocytes Absolute: 0.7 10*3/uL (ref 0.1–1.0)
Monocytes Relative: 8 %
Neutro Abs: 5.2 10*3/uL (ref 1.7–7.7)
Neutrophils Relative %: 60 %
Platelets: 283 10*3/uL (ref 150–400)
RBC: 3.45 MIL/uL — ABNORMAL LOW (ref 3.87–5.11)
RDW: 13.8 % (ref 11.5–15.5)
WBC: 8.8 10*3/uL (ref 4.0–10.5)
nRBC: 0 % (ref 0.0–0.2)

## 2020-07-25 LAB — GROUP A STREP BY PCR: Group A Strep by PCR: NOT DETECTED

## 2020-07-25 LAB — MONONUCLEOSIS SCREEN: Mono Screen: NONREACTIVE — AB

## 2020-07-25 NOTE — ED Notes (Signed)
Assumed care of this patient. Pt ambulatory to room 03. Vitals taken. A&Ox4. Respirations regular/unlabored. Labs obtained via venipuncture. Patient educated on procedure. Labs labeled/sent to lab. Connected to BP and pulse ox. Stretcher low, wheels locked, call bell within reach.

## 2020-07-25 NOTE — ED Triage Notes (Signed)
Pt came in with c/o sore throat. Pt states that she feels SOB because of it. Patient states that it started yesterday. Pt O2 saturations are 100%. Pt also has nasal congestion.

## 2020-07-25 NOTE — ED Provider Notes (Signed)
[facility] DEPT Provider Note: Georgena Spurling, MD, FACEP  CSN: [id number] MRN: [id number] ARRIVAL: 07/25/20 at [place]: WA03/[id number]   CHIEF COMPLAINT  Sore Throat   HISTORY OF PRESENT ILLNESS  07/25/20 2:00 AM Ashley Freeman is a 29 y.o. female type I diabetic with a sore throat that began yesterday.  She rates associated pain as an 8 out of 10, worse with swallowing.  She states it feels like she cannot breathe.  She denies nasal congestion or other symptoms to me.   Past Medical History:  Diagnosis Date  . Abscess, gluteal, right 08/24/2013  . AKI (acute kidney injury) ([place]) 07/26/2014  . Anemia 02/19/2012  . Bartholin's gland abscess 09/19/2013  . BV (bacterial vaginosis) 11/24/2015  . Diabetes mellitus type I ([place]) 2001   Diagnosed at age 25 ; Type I  . Diarrhea 05/30/2016  . DKA (diabetic ketoacidoses) 08/19/2013   Also in 2018  . Gonorrhea 08/2011   Treated in 09/2011  . History of trichomoniasis 05/31/2016  . Hyperlipidemia 03/28/2016  . Sepsis ([place]) 09/19/2013    Past Surgical History:  Procedure Laterality Date  . CESAREAN SECTION N/A 10/05/2019   Procedure: CESAREAN SECTION;  Surgeon: Aletha Halim, MD;  [facility];  Service: Obstetrics;  Laterality: N/A;  . INCISION AND DRAINAGE ABSCESS Left 09/28/2019   Procedure: INCISION AND DRAINAGE VULVAR ABCESS;  Surgeon: [name], MD;  Location: [place];  Service: Gynecology;  Laterality: Left;  . INCISION AND DRAINAGE PERIRECTAL ABSCESS Right 08/18/2013   Procedure: IRRIGATION AND DEBRIDEMENT GLUTEAL ABSCESS;  Surgeon: Ralene Ok, MD;  Location: [name];  Service: General;  Laterality: Right;  . INCISION AND DRAINAGE PERIRECTAL ABSCESS Right 09/19/2013   Procedure: IRRIGATION AND DEBRIDEMENT RIGHT GLUTEAL AND LABIAL ABSCESSES;  Surgeon: Ralene Ok, MD;  Location: [place];  Service: General;  Laterality: Right;  . INCISION AND DRAINAGE PERIRECTAL ABSCESS Right 09/24/2013   Procedure:  IRRIGATION AND DEBRIDEMENT PERIRECTAL ABSCESS;  Surgeon: Gwenyth Ober, MD;  Location: [place];  Service: General;  Laterality: Right;    Family History  Problem Relation Age of Onset  . Asthma Mother   . Carpal tunnel syndrome Mother   . Gout Father   . Diabetes Paternal Grandmother   . Anesthesia problems Neg Hx     Social History   Tobacco Use  . Smoking status: Never Smoker  . Smokeless tobacco: Never Used  Vaping Use  . Vaping Use: Never used  Substance Use Topics  . Alcohol use: Not Currently  . Drug use: No    Prior to Admission medications   Medication Sig Start Date End Date Taking? Authorizing Provider  insulin aspart (NOVOLOG) 100 UNIT/ML injection Use via insulin pump daily. Max daily 30 units 06/20/20  Yes Philemon Kingdom, MD  blood glucose meter kit and supplies KIT Dispense based on patient and insurance preference. Use up to four times daily as directed. (FOR ICD-9 250.00, 250.01). 05/25/17   Isla Pence, MD  Blood Pressure Monitoring (BLOOD PRESSURE KIT) DEVI 1 Device by Does not apply route as needed. 05/06/19   Anyanwu, Sallyanne Havers, MD  Continuous Blood Gluc Sensor (DEXCOM G6 SENSOR) MISC USE AS DIRECTED. CHANGE SENSOR EVERY 10 DAYS 07/22/20   Shamleffer, Melanie Crazier, MD  Continuous Blood Gluc Transmit (DEXCOM G6 TRANSMITTER) MISC USE ONE TRANSMITTER ONCE EVERY 90 DAYS 07/14/20   Shamleffer, Melanie Crazier, MD  Insulin Disposable Pump (OMNIPOD DASH 5 PACK PODS) MISC Change POD every 3 days. Please dispense  6 boxes 07/15/20   Shamleffer, Melanie Crazier, MD  Ferrous Sulfate (IRON) 325 (65 Fe) MG TABS Take 1 tablet (325 mg total) by mouth every other day. Patient not taking: Reported on 07/25/2020 06/17/19 07/25/20  Chauncey Mann, MD  hydrochlorothiazide (HYDRODIURIL) 25 MG tablet Take 1 tablet (25 mg total) by mouth daily. Patient not taking: No sig reported 10/08/19 07/25/20  Constant, Peggy, MD    Allergies Penicillins, Benadryl [diphenhydramine], and  Doxycycline   REVIEW OF SYSTEMS  Negative except as noted here or in the History of Present Illness.   PHYSICAL EXAMINATION  Initial Vital Signs Blood pressure (!) 157/104, pulse (!) 107, temperature 97.8 F (36.6 C), temperature source Oral, resp. rate 20, height _0  (1.6 m), weight 58.1 kg, SpO2 100 %, unknown if currently breastfeeding.  Examination General: Well-developed, well-nourished female in no acute distress; appearance consistent with age of record HENT: normocephalic; atraumatic; no pharyngeal erythema or exudate; uvula midline; no stridor; no dysphonia; no trismus Eyes: pupils equal, round and reactive to light; extraocular muscles intact; bilateral pseudophakia Neck: supple; anterior cervical lymphadenopathy Heart: regular rate and rhythm Lungs: clear to auscultation bilaterally Abdomen: soft; nondistended; nontender; bowel sounds present Extremities: No deformity; full range of motion; pulses normal Neurologic: Awake, alert and oriented; motor function intact in all extremities and symmetric; no facial droop Skin: Warm and dry Psychiatric: Whimpering; curled up under blanket in fetal position; no eye contact   RESULTS  Summary of this visit's results, reviewed and interpreted by myself:   EKG Interpretation  Date/Time:    Ventricular Rate:    PR Interval:    QRS Duration:   QT Interval:    QTC Calculation:   R Axis:     Text Interpretation:        Laboratory Studies: Results for orders placed or performed during the hospital encounter of 07/25/20 (from the past 24 hour(s))  Group A Strep by PCR     Status: None   Collection Time: 07/25/20  2:09 AM   Specimen: Throat; Sterile Swab  Result Value Ref Range   Group A Strep by PCR NOT DETECTED NOT DETECTED  Mononucleosis screen     Status: Abnormal   Collection Time: 07/25/20  2:20 AM  Result Value Ref Range   Mono Screen NON REACTIVE (A) NEGATIVE  CBC with Differential/Platelet     Status: Abnormal    Collection Time: 07/25/20  2:20 AM  Result Value Ref Range   WBC 8.8 4.0 - 10.5 K/uL   RBC 3.45 (L) 3.87 - 5.11 MIL/uL   Hemoglobin 9.4 (L) 12.0 - 15.0 g/dL   HCT 29.2 (L) 36.0 - 46.0 %   MCV 84.6 80.0 - 100.0 fL   MCH 27.2 26.0 - 34.0 pg   MCHC 32.2 30.0 - 36.0 g/dL   RDW 13.8 11.5 - 15.5 %   Platelets 283 150 - 400 K/uL   nRBC 0.0 0.0 - 0.2 %   Neutrophils Relative % 60 %   Neutro Abs 5.2 1.7 - 7.7 K/uL   Lymphocytes Relative 30 %   Lymphs Abs 2.7 0.7 - 4.0 K/uL   Monocytes Relative 8 %   Monocytes Absolute 0.7 0.1 - 1.0 K/uL   Eosinophils Relative 2 %   Eosinophils Absolute 0.2 0.0 - 0.5 K/uL   Basophils Relative 0 %   Basophils Absolute 0.0 0.0 - 0.1 K/uL   Immature Granulocytes 0 %   Abs Immature Granulocytes 0.03 0.00 - 0.07 K/uL   Imaging  Studies: No results found.  ED COURSE and MDM  Nursing notes, initial and subsequent vitals signs, including pulse oximetry, reviewed and interpreted by myself.  Vitals:   07/25/20 0152 07/25/20 0217  BP: (!) 157/104 (!) 153/93  Pulse: (!) 107 (!) 112  Resp: 20 19  Temp: 97.8 F (36.6 C)   TempSrc: Oral   SpO2: 100% 99%  Weight: 58.1 kg   Height: _0  (1.6 m)    Medications - No data to display  3:51 AM Patient negative for mononucleosis and strep throat.  Her throat is not significantly inflamed on physical examination.  Patient is sleeping peacefully with no difficulty breathing.  PROCEDURES  Procedures   ED DIAGNOSES     ICD-10-CM   1. Sore throat  J02.9        Bernis Schreur, Jenny Reichmann, MD 07/25/20 [phone]

## 2020-08-09 ENCOUNTER — Telehealth: Payer: Self-pay | Admitting: Internal Medicine

## 2020-08-09 NOTE — Telephone Encounter (Signed)
Noted  

## 2020-08-09 NOTE — Telephone Encounter (Signed)
New message    Patient drop off Disability paperwork to be filled out deadline is  4.21.22

## 2020-08-10 NOTE — Telephone Encounter (Signed)
Place in Dr Quin Hoop inbox to review

## 2020-08-11 NOTE — Telephone Encounter (Signed)
Done

## 2020-08-11 NOTE — Telephone Encounter (Signed)
Patient called to check on the status of her paper work, she said she just needs it to be able to go back to work again. I did inform her as of right now they they were in the Dr's box to complete.

## 2020-08-15 NOTE — Telephone Encounter (Signed)
Message left for patient to return my call.  Faxed paperwork. Left original in the front office for patient to pick up. Also need to inform patient that there is a $20 charge for this paperwork.

## 2020-08-15 NOTE — Telephone Encounter (Signed)
Pt called the office to check on the status of her paperwork. I relayed info to pt that it has been completed and is up at the front for her to pick up. Along with a $20 fee for paperwork completion.

## 2020-08-16 ENCOUNTER — Other Ambulatory Visit: Payer: Self-pay | Admitting: Internal Medicine

## 2020-08-16 NOTE — Telephone Encounter (Signed)
Patient called to request the following RX with refills:  MEDICATION: Continuous Blood Gluc Sensor (DEXCOM G6 SENSOR) MISC  PHARMACY:   [facility] ([place]), [place] - [address] Phone:  [phone]  Fax:  [phone]       HAS THE PATIENT CONTACTED THEIR PHARMACY?  Yes  IS THIS A 90 DAY SUPPLY : No  IS PATIENT OUT OF MEDICATION: Yes  IF NOT; HOW MUCH IS LEFT: 0  LAST APPOINTMENT DATE: '@4'$ /03/2021  NEXT APPOINTMENT DATE:'@6'$ /24/2022  DO WE HAVE YOUR PERMISSION TO LEAVE A DETAILED MESSAGE?: Yes  OTHER COMMENTS:    **Let patient know to contact pharmacy at the end of the day to make sure medication is ready. **  ** Please notify patient to allow 48-72 hours to process**  **Encourage patient to contact the pharmacy for refills or they can request refills through [facility]**

## 2020-08-25 ENCOUNTER — Emergency Department (HOSPITAL_COMMUNITY)
Admission: EM | Admit: 2020-08-25 | Discharge: 2020-08-25 | Disposition: A | Discharge status: Home / Self Care | Payer: BLUE CROSS/BLUE SHIELD | Attending: Emergency Medicine | Admitting: Emergency Medicine

## 2020-08-25 ENCOUNTER — Encounter (HOSPITAL_COMMUNITY): Payer: Self-pay | Admitting: Emergency Medicine

## 2020-08-25 ENCOUNTER — Other Ambulatory Visit: Payer: Self-pay

## 2020-08-25 DIAGNOSIS — E1010 Type 1 diabetes mellitus with ketoacidosis without coma: Secondary | ICD-10-CM | POA: Diagnosis not present

## 2020-08-25 DIAGNOSIS — R7989 Other specified abnormal findings of blood chemistry: Secondary | ICD-10-CM

## 2020-08-25 DIAGNOSIS — R110 Nausea: Secondary | ICD-10-CM | POA: Diagnosis present

## 2020-08-25 DIAGNOSIS — N1831 Chronic kidney disease, stage 3a: Secondary | ICD-10-CM | POA: Insufficient documentation

## 2020-08-25 DIAGNOSIS — E1022 Type 1 diabetes mellitus with diabetic chronic kidney disease: Secondary | ICD-10-CM | POA: Diagnosis not present

## 2020-08-25 DIAGNOSIS — E860 Dehydration: Secondary | ICD-10-CM | POA: Diagnosis not present

## 2020-08-25 DIAGNOSIS — Z794 Long term (current) use of insulin: Secondary | ICD-10-CM | POA: Insufficient documentation

## 2020-08-25 DIAGNOSIS — R000 Tachycardia, unspecified: Secondary | ICD-10-CM | POA: Diagnosis not present

## 2020-08-25 LAB — URINALYSIS, ROUTINE W REFLEX MICROSCOPIC
Bilirubin Urine: NEGATIVE
Glucose, UA: 50 mg/dL — AB
Ketones, ur: NEGATIVE mg/dL
Nitrite: NEGATIVE
Protein, ur: >=300 mg/dL — AB
Specific Gravity, Urine: 1.015 (ref 1.005–1.030)
pH: 5 (ref 5.0–8.0)

## 2020-08-25 LAB — CBC WITH DIFFERENTIAL/PLATELET
Abs Immature Granulocytes: 0.04 10*3/uL (ref 0.00–0.07)
Basophils Absolute: 0 10*3/uL (ref 0.0–0.1)
Basophils Relative: 0 %
Eosinophils Absolute: 0.2 10*3/uL (ref 0.0–0.5)
Eosinophils Relative: 2 %
HCT: 29.8 % — ABNORMAL LOW (ref 36.0–46.0)
Hemoglobin: 9.9 g/dL — ABNORMAL LOW (ref 12.0–15.0)
Immature Granulocytes: 0 %
Lymphocytes Relative: 30 %
Lymphs Abs: 2.7 10*3/uL (ref 0.7–4.0)
MCH: 27.4 pg (ref 26.0–34.0)
MCHC: 33.2 g/dL (ref 30.0–36.0)
MCV: 82.5 fL (ref 80.0–100.0)
Monocytes Absolute: 0.7 10*3/uL (ref 0.1–1.0)
Monocytes Relative: 7 %
Neutro Abs: 5.5 10*3/uL (ref 1.7–7.7)
Neutrophils Relative %: 61 %
Platelets: 343 10*3/uL (ref 150–400)
RBC: 3.61 MIL/uL — ABNORMAL LOW (ref 3.87–5.11)
RDW: 13.6 % (ref 11.5–15.5)
WBC: 9.1 10*3/uL (ref 4.0–10.5)
nRBC: 0 % (ref 0.0–0.2)

## 2020-08-25 LAB — COMPREHENSIVE METABOLIC PANEL
ALT: 17 U/L (ref 0–44)
AST: 18 U/L (ref 15–41)
Albumin: 3.3 g/dL — ABNORMAL LOW (ref 3.5–5.0)
Alkaline Phosphatase: 66 U/L (ref 38–126)
Anion gap: 7 (ref 5–15)
BUN: 36 mg/dL — ABNORMAL HIGH (ref 6–20)
CO2: 17 mmol/L — ABNORMAL LOW (ref 22–32)
Calcium: 9.2 mg/dL (ref 8.9–10.3)
Chloride: 109 mmol/L (ref 98–111)
Creatinine, Ser: 1.83 mg/dL — ABNORMAL HIGH (ref 0.44–1.00)
GFR, Estimated: 38 mL/min — ABNORMAL LOW (ref >60)
Glucose, Bld: 259 mg/dL — ABNORMAL HIGH (ref 70–99)
Potassium: 4.1 mmol/L (ref 3.5–5.1)
Sodium: 133 mmol/L — ABNORMAL LOW (ref 135–145)
Total Bilirubin: 0.3 mg/dL (ref 0.3–1.2)
Total Protein: 7.2 g/dL (ref 6.5–8.1)

## 2020-08-25 LAB — CBG MONITORING, ED: Glucose-Capillary: 237 mg/dL — ABNORMAL HIGH (ref 70–99)

## 2020-08-25 LAB — HCG, QUANTITATIVE, PREGNANCY: hCG, Beta Chain, Quant, S: 1 m[IU]/mL (ref <5)

## 2020-08-25 MED ORDER — ONDANSETRON 8 MG PO TBDP
8.0000 mg | ORAL_TABLET | Freq: Once | ORAL | Status: AC
  Administered 2020-08-25: 8 mg via ORAL
  Filled 2020-08-25: qty 1

## 2020-08-25 MED ORDER — PROMETHAZINE HCL 25 MG PO TABS: 25.0000 mg | ORAL_TABLET | Freq: Three times a day (TID) | ORAL | 0 refills | Status: DC | PRN

## 2020-08-25 MED ORDER — LACTATED RINGERS IV BOLUS
1000.0000 mL | Freq: Once | INTRAVENOUS | Status: AC
  Administered 2020-08-25: 1000 mL via INTRAVENOUS

## 2020-08-25 NOTE — ED Triage Notes (Signed)
Patient states her CBGs have been in the 500s x3 days, has been throwing up and had a virus over the weekend. Endorses medication compliance. Also had some diarrhea, denies fevers.

## 2020-08-25 NOTE — ED Provider Notes (Signed)
MSE was initiated and I personally evaluated the patient and placed orders (if any) at  12:43 PM on August 25, 2020.  The patient appears stable so that the remainder of the MSE may be completed by another provider.   Chief Complaint: hyperglycemia, vomiting   HPI:  had a cold a couple days ago, then sugars were 500, has insulin pump. Type two diabetic. Vomiting over the weekend.    ROS: weak, tired    Physical Exam:  Gen:                Awake, no distress  HEENT:          Atraumatic  Resp:              Normal effort  Cardiac:          Normal rate  Abd:                Nondistended, nontender  MSK:              Moves extremities without difficulty  Neuro:            Speech clear,EOMS intact      Initiation of care has begun. The patient has been counseled on the process, plan, and necessity for staying for the completion/evaluation, and the remainder of the medical screening examination    Alfredia Client, PA-C 08/25/20 [place], [place], DO 08/25/20 1557

## 2020-08-25 NOTE — ED Provider Notes (Signed)
[facility] DEPT Provider Note   CSN: [id number] Arrival date & time: 08/25/20  1212     History Chief Complaint  Patient presents with  . Hyperglycemia  . Emesis    Ashley Freeman is a 30 y.o. female.  HPI    30 year old female comes in a chief complaint of elevated blood sugar, nausea. She has history of type 1 diabetes with insulin pump.  There is also history of CKD, DKA.  Patient reports that over the weekend she must have acquired a stomach bug as she had nausea, vomiting, diarrhea.  At this point she no longer has emesis but did have dry heaving and nausea.  She does have Zofran at home.  Her blood sugar has remained on the higher side, she wanted to make sure she did not have DKA.  Review of system is negative for any abdominal pain, diarrhea, UTI-like symptoms, fevers, chills, URI-like symptoms.  Past Medical History:  Diagnosis Date  . Abscess, gluteal, right 08/24/2013  . AKI (acute kidney injury) ([place]) 07/26/2014  . Anemia 02/19/2012  . Bartholin's gland abscess 09/19/2013  . BV (bacterial vaginosis) 11/24/2015  . Diabetes mellitus type I ([place]) 2001   Diagnosed at age 39 ; Type I  . Diarrhea 05/30/2016  . DKA (diabetic ketoacidoses) 08/19/2013   Also in 2018  . Gonorrhea 08/2011   Treated in 09/2011  . History of trichomoniasis 05/31/2016  . Hyperlipidemia 03/28/2016  . Sepsis ([place]) 09/19/2013    Patient Active Problem List   Diagnosis Date Noted  . Type 1 diabetes mellitus with stage 3a chronic kidney disease ([place]) 07/18/2020  . History of pre-eclampsia 10/05/2019  . Transaminitis 10/05/2019  . Elevated serum creatinine 10/05/2019  . Bilateral leg edema 09/27/2019  . Systolic ejection murmur 76/73/4193  . Shortness of breath 08/03/2019  . Type 1 diabetes mellitus with hyperglycemia ([place]) 07/24/2019  . Diabetic retinopathy ([place]) 07/02/2019  . Proteinuria due to type 1 diabetes mellitus ([place]) 05/21/2019  . Diabetic  neuropathy ([place]) 01/16/2016  . Diabetes mellitus type 1, uncontrolled, with complications ([place]) 79/06/4095    Past Surgical History:  Procedure Laterality Date  . CESAREAN SECTION N/A 10/05/2019   Procedure: CESAREAN SECTION;  Surgeon: Aletha Halim, MD;  [facility];  Service: Obstetrics;  Laterality: N/A;  . INCISION AND DRAINAGE ABSCESS Left 09/28/2019   Procedure: INCISION AND DRAINAGE VULVAR ABCESS;  Surgeon: [name], MD;  Location: [facility];  Service: Gynecology;  Laterality: Left;  . INCISION AND DRAINAGE PERIRECTAL ABSCESS Right 08/18/2013   Procedure: IRRIGATION AND DEBRIDEMENT GLUTEAL ABSCESS;  Surgeon: Ralene Ok, MD;  Location: [facility];  Service: General;  Laterality: Right;  . INCISION AND DRAINAGE PERIRECTAL ABSCESS Right 09/19/2013   Procedure: IRRIGATION AND DEBRIDEMENT RIGHT GLUTEAL AND LABIAL ABSCESSES;  Surgeon: Ralene Ok, MD;  Location: [facility];  Service: General;  Laterality: Right;  . INCISION AND DRAINAGE PERIRECTAL ABSCESS Right 09/24/2013   Procedure: IRRIGATION AND DEBRIDEMENT PERIRECTAL ABSCESS;  Surgeon: Gwenyth Ober, MD;  Location: [facility];  Service: General;  Laterality: Right;     OB History    Gravida  2   Para  1   Term      Preterm  1   AB  1   Living  1     SAB  1   IAB      Ectopic      Multiple  0   Live Births  1  Family History  Problem Relation Age of Onset  . Asthma Mother   . Carpal tunnel syndrome Mother   . Gout Father   . Diabetes Paternal Grandmother   . Anesthesia problems Neg Hx     Social History   Tobacco Use  . Smoking status: Never Smoker  . Smokeless tobacco: Never Used  Vaping Use  . Vaping Use: Never used  Substance Use Topics  . Alcohol use: Not Currently  . Drug use: No    Home Medications Prior to Admission medications   Medication Sig Start Date End Date Taking? Authorizing Provider  promethazine (PHENERGAN) 25 MG tablet Take 1 tablet (25 mg total) by mouth every  8 (eight) hours as needed for nausea or vomiting (if zofran fails). 08/25/20  Yes , , MD  blood glucose meter kit and supplies KIT Dispense based on patient and insurance preference. Use up to four times daily as directed. (FOR ICD-9 250.00, 250.01). 05/25/17   Haviland, Julie, MD  Blood Pressure Monitoring (BLOOD PRESSURE KIT) DEVI 1 Device by Does not apply route as needed. 05/06/19   Anyanwu, Ugonna A, MD  Continuous Blood Gluc Sensor (DEXCOM G6 SENSOR) MISC USE AS DIRECTED, CHANGE SENSOR EVERY 10 DAYS 08/16/20   Shamleffer, Ibtehal Jaralla, MD  Continuous Blood Gluc Transmit (DEXCOM G6 TRANSMITTER) MISC USE ONE TRANSMITTER ONCE EVERY 90 DAYS 07/14/20   Shamleffer, Ibtehal Jaralla, MD  insulin aspart (NOVOLOG) 100 UNIT/ML injection Use via insulin pump daily. Max daily 30 units 06/20/20   Gherghe, Cristina, MD  Insulin Disposable Pump (OMNIPOD DASH 5 PACK PODS) MISC Change POD every 3 days. Please dispense 6 boxes 07/15/20   Shamleffer, Ibtehal Jaralla, MD  Ferrous Sulfate (IRON) 325 (65 Fe) MG TABS Take 1 tablet (325 mg total) by mouth every other day. Patient not taking: Reported on 07/25/2020 06/17/19 07/25/20  Fair, Chelsea N, MD  hydrochlorothiazide (HYDRODIURIL) 25 MG tablet Take 1 tablet (25 mg total) by mouth daily. Patient not taking: No sig reported 10/08/19 07/25/20  Constant, Peggy, MD    Allergies    Penicillins, Benadryl [diphenhydramine], and Doxycycline  Review of Systems   Review of Systems  Constitutional: Positive for activity change. Negative for chills and fever.  Gastrointestinal: Positive for nausea.  Genitourinary: Negative for dysuria.  Neurological: Positive for dizziness.  Hematological: Does not bruise/bleed easily.  All other systems reviewed and are negative.   Physical Exam Updated Vital Signs BP (!) 159/90   Pulse (!) 101   Temp 97.9 F (36.6 C) (Oral)   Resp 18   Ht 5' 3" (1.6 m)   Wt 56.7 kg   LMP 08/16/2020   SpO2 100%   BMI 22.14 kg/m    Physical Exam Vitals and nursing note reviewed.  Constitutional:      Appearance: She is well-developed.  HENT:     Head: Normocephalic and atraumatic.  Cardiovascular:     Rate and Rhythm: Tachycardia present.  Pulmonary:     Effort: Pulmonary effort is normal.  Abdominal:     General: Bowel sounds are normal.  Musculoskeletal:     Cervical back: Normal range of motion and neck supple.  Skin:    General: Skin is warm and dry.  Neurological:     Mental Status: She is alert and oriented to person, place, and time.     ED Results / Procedures / Treatments   Labs (all labs ordered are listed, but only abnormal results are displayed) Labs Reviewed  COMPREHENSIVE METABOLIC PANEL -   Abnormal; Notable for the following components:      Result Value   Sodium 133 (*)    CO2 17 (*)    Glucose, Bld 259 (*)    BUN 36 (*)    Creatinine, Ser 1.83 (*)    Albumin 3.3 (*)    GFR, Estimated 38 (*)    All other components within normal limits  CBC WITH DIFFERENTIAL/PLATELET - Abnormal; Notable for the following components:   RBC 3.61 (*)    Hemoglobin 9.9 (*)    HCT 29.8 (*)    All other components within normal limits  URINALYSIS, ROUTINE W REFLEX MICROSCOPIC - Abnormal; Notable for the following components:   APPearance HAZY (*)    Glucose, UA 50 (*)    Hgb urine dipstick SMALL (*)    Protein, ur >=300 (*)    Leukocytes,Ua LARGE (*)    Bacteria, UA RARE (*)    All other components within normal limits  CBG MONITORING, ED - Abnormal; Notable for the following components:   Glucose-Capillary 237 (*)    All other components within normal limits  HCG, QUANTITATIVE, PREGNANCY    EKG None  Radiology No results found.  Procedures Procedures   Medications Ordered in ED Medications  lactated ringers bolus 1,000 mL (1,000 mLs Intravenous New Bag/Given 08/25/20 1409)  ondansetron (ZOFRAN-ODT) disintegrating tablet 8 mg (8 mg Oral Given 08/25/20 1358)    ED Course  I have  reviewed the triage vital signs and the nursing notes.  Pertinent labs & imaging results that were available during my care of the patient were reviewed by me and considered in my medical decision making (see chart for details).  Clinical Course as of 08/25/20 1548  Thu Aug 25, 2020  1547 Pt reassessed. Pt's VSS and WNL. Pt's cap refill < 3 seconds. Pt has been hydrated in the ER and now passed po challenge. We will discharge with antiemetic. Strict ER return precautions have been discussed and pt will return if he is unable to tolerate fluids and symptoms are getting worse.  [AN]    Clinical Course User Index [AN] , , MD   MDM Rules/Calculators/A&P                          30-year-old female with history of type 1 diabetes comes in with chief complaint of nausea, vomiting and elevated blood sugar.  She wants to ensure she does not have DKA.  Her symptoms of nausea, vomiting and diarrhea are better, but she does now have some nausea.  No UTI-like symptoms.   Her exam is reassuring. Mild tachycardia noted.  Labs ordered, no anion gap seen.  Patient does have slight elevation of her creatinine from baseline.  Given her CKD, we will give her some IV hydration and also get oral challenge initiated.    Final Clinical Impression(s) / ED Diagnoses Final diagnoses:  Elevated serum creatinine  Dehydration  Nausea    Rx / DC Orders ED Discharge Orders         Ordered    promethazine (PHENERGAN) 25 MG tablet  Every 8 hours PRN        08/25/20 1546           , , MD 08/25/20 1548  

## 2020-08-25 NOTE — ED Notes (Signed)
PO challenge initiated

## 2020-08-25 NOTE — Discharge Instructions (Addendum)
The labs here did not reveal evidence of DKA.  Sugar is slightly elevated, will be suspect it will normalize given that he have an insulin pump.  We recommend that you consume low-carb diet and if your sugar remains high, then seeing your PCP.  If you start having fevers, chills, worsening nausea, vomiting return to the ER.

## 2020-08-25 NOTE — ED Notes (Signed)
Patient tolerated food and beverage well.

## 2020-09-12 ENCOUNTER — Emergency Department (HOSPITAL_COMMUNITY)
Admission: EM | Admit: 2020-09-12 | Discharge: 2020-09-12 | Disposition: A | Discharge status: Home / Self Care | Payer: Medicaid Other | Attending: Emergency Medicine | Admitting: Emergency Medicine

## 2020-09-12 ENCOUNTER — Encounter (HOSPITAL_COMMUNITY): Payer: Self-pay | Admitting: *Deleted

## 2020-09-12 ENCOUNTER — Other Ambulatory Visit: Payer: Self-pay

## 2020-09-12 DIAGNOSIS — E1022 Type 1 diabetes mellitus with diabetic chronic kidney disease: Secondary | ICD-10-CM | POA: Insufficient documentation

## 2020-09-12 DIAGNOSIS — R531 Weakness: Secondary | ICD-10-CM | POA: Diagnosis present

## 2020-09-12 DIAGNOSIS — E1040 Type 1 diabetes mellitus with diabetic neuropathy, unspecified: Secondary | ICD-10-CM | POA: Insufficient documentation

## 2020-09-12 DIAGNOSIS — E860 Dehydration: Secondary | ICD-10-CM | POA: Diagnosis not present

## 2020-09-12 DIAGNOSIS — N1831 Chronic kidney disease, stage 3a: Secondary | ICD-10-CM | POA: Insufficient documentation

## 2020-09-12 DIAGNOSIS — Z794 Long term (current) use of insulin: Secondary | ICD-10-CM | POA: Diagnosis not present

## 2020-09-12 DIAGNOSIS — E10319 Type 1 diabetes mellitus with unspecified diabetic retinopathy without macular edema: Secondary | ICD-10-CM | POA: Insufficient documentation

## 2020-09-12 DIAGNOSIS — E1010 Type 1 diabetes mellitus with ketoacidosis without coma: Secondary | ICD-10-CM | POA: Insufficient documentation

## 2020-09-12 DIAGNOSIS — N3000 Acute cystitis without hematuria: Secondary | ICD-10-CM | POA: Insufficient documentation

## 2020-09-12 LAB — CBC
HCT: 31.4 % — ABNORMAL LOW (ref 36.0–46.0)
Hemoglobin: 10 g/dL — ABNORMAL LOW (ref 12.0–15.0)
MCH: 27.5 pg (ref 26.0–34.0)
MCHC: 31.8 g/dL (ref 30.0–36.0)
MCV: 86.5 fL (ref 80.0–100.0)
Platelets: 302 10*3/uL (ref 150–400)
RBC: 3.63 MIL/uL — ABNORMAL LOW (ref 3.87–5.11)
RDW: 14.1 % (ref 11.5–15.5)
WBC: 6.6 10*3/uL (ref 4.0–10.5)
nRBC: 0 % (ref 0.0–0.2)

## 2020-09-12 LAB — CBG MONITORING, ED
Glucose-Capillary: 294 mg/dL — ABNORMAL HIGH (ref 70–99)
Glucose-Capillary: 321 mg/dL — ABNORMAL HIGH (ref 70–99)
Glucose-Capillary: 112 mg/dL — ABNORMAL HIGH (ref 70–99)

## 2020-09-12 LAB — HEPATIC FUNCTION PANEL
ALT: 24 U/L (ref 0–44)
AST: 29 U/L (ref 15–41)
Albumin: 3 g/dL — ABNORMAL LOW (ref 3.5–5.0)
Alkaline Phosphatase: 67 U/L (ref 38–126)
Bilirubin, Direct: <0.1 mg/dL (ref 0.0–0.2)
Total Bilirubin: 0.4 mg/dL (ref 0.3–1.2)
Total Protein: 6.6 g/dL (ref 6.5–8.1)

## 2020-09-12 LAB — BASIC METABOLIC PANEL
Anion gap: 7 (ref 5–15)
BUN: 36 mg/dL — ABNORMAL HIGH (ref 6–20)
CO2: 17 mmol/L — ABNORMAL LOW (ref 22–32)
Calcium: 8.9 mg/dL (ref 8.9–10.3)
Chloride: 111 mmol/L (ref 98–111)
Creatinine, Ser: 1.85 mg/dL — ABNORMAL HIGH (ref 0.44–1.00)
GFR, Estimated: 38 mL/min — ABNORMAL LOW (ref >60)
Glucose, Bld: 374 mg/dL — ABNORMAL HIGH (ref 70–99)
Potassium: 4.2 mmol/L (ref 3.5–5.1)
Sodium: 135 mmol/L (ref 135–145)

## 2020-09-12 LAB — URINALYSIS, ROUTINE W REFLEX MICROSCOPIC
Bilirubin Urine: NEGATIVE
Glucose, UA: >=500 mg/dL — AB
Ketones, ur: NEGATIVE mg/dL
Nitrite: NEGATIVE
Protein, ur: 100 mg/dL — AB
RBC / HPF: >50 RBC/hpf — ABNORMAL HIGH (ref 0–5)
Specific Gravity, Urine: 1.007 (ref 1.005–1.030)
WBC, UA: >50 WBC/hpf — ABNORMAL HIGH (ref 0–5)
pH: 6 (ref 5.0–8.0)

## 2020-09-12 LAB — I-STAT CHEM 8, ED
BUN: 33 mg/dL — ABNORMAL HIGH (ref 6–20)
Calcium, Ion: 1.32 mmol/L (ref 1.15–1.40)
Chloride: 111 mmol/L (ref 98–111)
Creatinine, Ser: 1.9 mg/dL — ABNORMAL HIGH (ref 0.44–1.00)
Glucose, Bld: 349 mg/dL — ABNORMAL HIGH (ref 70–99)
HCT: 30 % — ABNORMAL LOW (ref 36.0–46.0)
Hemoglobin: 10.2 g/dL — ABNORMAL LOW (ref 12.0–15.0)
Potassium: 4.3 mmol/L (ref 3.5–5.1)
Sodium: 137 mmol/L (ref 135–145)
TCO2: 17 mmol/L — ABNORMAL LOW (ref 22–32)

## 2020-09-12 LAB — I-STAT BETA HCG BLOOD, ED (MC, WL, AP ONLY): I-stat hCG, quantitative: <5 m[IU]/mL (ref <5)

## 2020-09-12 LAB — LIPASE, BLOOD: Lipase: 29 U/L (ref 11–51)

## 2020-09-12 MED ORDER — ONDANSETRON 4 MG PO TBDP: ORAL_TABLET | ORAL | 0 refills | Status: DC

## 2020-09-12 MED ORDER — SODIUM CHLORIDE 0.9 % IV BOLUS
1000.0000 mL | Freq: Once | INTRAVENOUS | Status: AC
  Administered 2020-09-12: 1000 mL via INTRAVENOUS

## 2020-09-12 MED ORDER — SULFAMETHOXAZOLE-TRIMETHOPRIM 800-160 MG PO TABS: 1.0000 | ORAL_TABLET | Freq: Two times a day (BID) | ORAL | 0 refills | Status: DC

## 2020-09-12 MED ORDER — SODIUM CHLORIDE 0.9 % IV SOLN
1.0000 g | Freq: Once | INTRAVENOUS | Status: AC
  Administered 2020-09-12: 1 g via INTRAVENOUS
  Filled 2020-09-12: qty 10

## 2020-09-12 MED ORDER — ONDANSETRON HCL 4 MG/2ML IJ SOLN
4.0000 mg | Freq: Once | INTRAMUSCULAR | Status: AC
  Administered 2020-09-12: 4 mg via INTRAVENOUS
  Filled 2020-09-12: qty 2

## 2020-09-12 MED ORDER — SODIUM CHLORIDE 0.9 % IV BOLUS: 1000.0000 mL | Freq: Once | INTRAVENOUS | Status: DC

## 2020-09-12 NOTE — Discharge Instructions (Addendum)
Drink plenty of fluids.  Follow-up with your doctor in 2 days for recheck.

## 2020-09-12 NOTE — ED Notes (Signed)
Pt's CBG in triage: 321.  Pt stated she just gave herself insulin.

## 2020-09-12 NOTE — ED Provider Notes (Signed)
[facility] DEPT Provider Note   CSN: [id number] Arrival date & time: 09/12/20  1130     History Chief Complaint  Patient presents with  . Emesis  . Weakness    Ashley Freeman is a 30 y.o. female.  Patient comes in with vomiting but no pain, patient not vomiting any blood  The history is provided by the patient and medical records. No language interpreter was used.  Emesis Severity:  Moderate Timing:  Constant Quality:  Bilious material Able to tolerate:  Liquids Progression:  Unchanged Chronicity:  New Recent urination:  Normal Associated symptoms: no abdominal pain, no cough, no diarrhea and no headaches   Weakness Associated symptoms: vomiting   Associated symptoms: no abdominal pain, no chest pain, no cough, no diarrhea, no frequency, no headaches and no seizures        Past Medical History:  Diagnosis Date  . Abscess, gluteal, right 08/24/2013  . AKI (acute kidney injury) ([place]) 07/26/2014  . Anemia 02/19/2012  . Bartholin's gland abscess 09/19/2013  . BV (bacterial vaginosis) 11/24/2015  . Diabetes mellitus type I ([place]) 2001   Diagnosed at age 5 ; Type I  . Diarrhea 05/30/2016  . DKA (diabetic ketoacidoses) 08/19/2013   Also in 2018  . Gonorrhea 08/2011   Treated in 09/2011  . History of trichomoniasis 05/31/2016  . Hyperlipidemia 03/28/2016  . Sepsis ([place]) 09/19/2013    Patient Active Problem List   Diagnosis Date Noted  . Type 1 diabetes mellitus with stage 3a chronic kidney disease ([place]) 07/18/2020  . History of pre-eclampsia 10/05/2019  . Transaminitis 10/05/2019  . Elevated serum creatinine 10/05/2019  . Bilateral leg edema 09/27/2019  . Systolic ejection murmur 02/54/2706  . Shortness of breath 08/03/2019  . Type 1 diabetes mellitus with hyperglycemia ([place]) 07/24/2019  . Diabetic retinopathy ([place]) 07/02/2019  . Proteinuria due to type 1 diabetes mellitus ([place]) 05/21/2019  . Diabetic neuropathy ([place])  01/16/2016  . Diabetes mellitus type 1, uncontrolled, with complications ([place]) 23/76/2831    Past Surgical History:  Procedure Laterality Date  . CESAREAN SECTION N/A 10/05/2019   Procedure: CESAREAN SECTION;  Surgeon: Aletha Halim, MD;  [facility];  Service: Obstetrics;  Laterality: N/A;  . INCISION AND DRAINAGE ABSCESS Left 09/28/2019   Procedure: INCISION AND DRAINAGE VULVAR ABCESS;  Surgeon: [name], MD;  Location: [facility];  Service: Gynecology;  Laterality: Left;  . INCISION AND DRAINAGE PERIRECTAL ABSCESS Right 08/18/2013   Procedure: IRRIGATION AND DEBRIDEMENT GLUTEAL ABSCESS;  Surgeon: Ralene Ok, MD;  Location: [facility];  Service: General;  Laterality: Right;  . INCISION AND DRAINAGE PERIRECTAL ABSCESS Right 09/19/2013   Procedure: IRRIGATION AND DEBRIDEMENT RIGHT GLUTEAL AND LABIAL ABSCESSES;  Surgeon: Ralene Ok, MD;  Location: [facility];  Service: General;  Laterality: Right;  . INCISION AND DRAINAGE PERIRECTAL ABSCESS Right 09/24/2013   Procedure: IRRIGATION AND DEBRIDEMENT PERIRECTAL ABSCESS;  Surgeon: Gwenyth Ober, MD;  Location: [facility];  Service: General;  Laterality: Right;     OB History    Gravida  2   Para  1   Term      Preterm  1   AB  1   Living  1     SAB  1   IAB      Ectopic      Multiple  0   Live Births  1           Family History  Problem Relation Age of  Onset  . Asthma Mother   . Carpal tunnel syndrome Mother   . Gout Father   . Diabetes Paternal Grandmother   . Anesthesia problems Neg Hx     Social History   Tobacco Use  . Smoking status: Never Smoker  . Smokeless tobacco: Never Used  Vaping Use  . Vaping Use: Never used  Substance Use Topics  . Alcohol use: Not Currently  . Drug use: No    Home Medications Prior to Admission medications   Medication Sig Start Date End Date Taking? Authorizing Provider  albuterol (VENTOLIN HFA) 108 (90 Base) MCG/ACT inhaler Inhale 2 puffs into the lungs every 4  (four) hours as needed for wheezing or shortness of breath. 08/09/20  Yes [provider]  cetirizine-pseudoephedrine (ZYRTEC-D) 5-120 MG tablet Take 1 tablet by mouth every 12 (twelve) hours as needed for allergies. 08/09/20  Yes [provider]  insulin aspart (NOVOLOG) 100 UNIT/ML injection Use via insulin pump daily. Max daily 30 units 06/20/20  Yes Philemon Kingdom, MD  ondansetron (ZOFRAN ODT) 4 MG disintegrating tablet 36m ODT q4 hours prn nausea/vomit 09/12/20  Yes ZMilton Ferguson MD  promethazine (PHENERGAN) 25 MG tablet Take 1 tablet (25 mg total) by mouth every 8 (eight) hours as needed for nausea or vomiting (if zofran fails). 08/25/20  Yes NVarney Biles MD  sulfamethoxazole-trimethoprim (BACTRIM DS) 800-160 MG tablet Take 1 tablet by mouth 2 (two) times daily for 7 days. 09/12/20 09/19/20 Yes ZMilton Ferguson MD  blood glucose meter kit and supplies KIT Dispense based on patient and insurance preference. Use up to four times daily as directed. (FOR ICD-9 250.00, 250.01). 05/25/17   HIsla Pence MD  Blood Pressure Monitoring (BLOOD PRESSURE KIT) DEVI 1 Device by Does not apply route as needed. 05/06/19   Anyanwu, USallyanne Havers MD  Continuous Blood Gluc Sensor (DEXCOM G6 SENSOR) MISC USE AS DIRECTED, CHANGE SENSOR EVERY 10 DAYS 08/16/20   Shamleffer, IMelanie Crazier MD  Continuous Blood Gluc Transmit (DEXCOM G6 TRANSMITTER) MISC USE ONE TRANSMITTER ONCE EVERY 90 DAYS 07/14/20   Shamleffer, IMelanie Crazier MD  Insulin Disposable Pump (OMNIPOD DASH 5 PACK PODS) MISC Change POD every 3 days. Please dispense 6 boxes 07/15/20   Shamleffer, IMelanie Crazier MD  Ferrous Sulfate (IRON) 325 (65 Fe) MG TABS Take 1 tablet (325 mg total) by mouth every other day. Patient not taking: Reported on 07/25/2020 06/17/19 07/25/20  FChauncey Mann MD  hydrochlorothiazide (HYDRODIURIL) 25 MG tablet Take 1 tablet (25 mg total) by mouth daily. Patient not taking: No sig reported 10/08/19 07/25/20  Constant,  Peggy, MD    Allergies    Penicillins, Benadryl [diphenhydramine], and Doxycycline  Review of Systems   Review of Systems  Constitutional: Negative for appetite change and fatigue.  HENT: Negative for congestion, ear discharge and sinus pressure.   Eyes: Negative for discharge.  Respiratory: Negative for cough.   Cardiovascular: Negative for chest pain.  Gastrointestinal: Positive for vomiting. Negative for abdominal pain and diarrhea.  Genitourinary: Negative for frequency and hematuria.  Musculoskeletal: Negative for back pain.  Skin: Negative for rash.  Neurological: Positive for weakness. Negative for seizures and headaches.  Psychiatric/Behavioral: Negative for hallucinations.    Physical Exam Updated Vital Signs BP (!) 172/110   Pulse 97   Temp 98.7 F (37.1 C) (Oral)   Resp 18   LMP 09/06/2020   SpO2 99%   Physical Exam Vitals and nursing note reviewed.  Constitutional:      Appearance: She  is well-developed.  HENT:     Head: Normocephalic.     Nose: Nose normal.  Eyes:     General: No scleral icterus.    Conjunctiva/sclera: Conjunctivae normal.  Neck:     Thyroid: No thyromegaly.  Cardiovascular:     Rate and Rhythm: Normal rate and regular rhythm.     Heart sounds: No murmur heard. No friction rub. No gallop.   Pulmonary:     Breath sounds: No stridor. No wheezing or rales.  Chest:     Chest wall: No tenderness.  Abdominal:     General: There is no distension.     Tenderness: There is no abdominal tenderness. There is no rebound.  Musculoskeletal:        General: Normal range of motion.     Cervical back: Neck supple.  Lymphadenopathy:     Cervical: No cervical adenopathy.  Skin:    Findings: No erythema or rash.  Neurological:     Mental Status: She is alert and oriented to person, place, and time.     Motor: No abnormal muscle tone.     Coordination: Coordination normal.  Psychiatric:        Behavior: Behavior normal.     ED Results /  Procedures / Treatments   Labs (all labs ordered are listed, but only abnormal results are displayed) Labs Reviewed  BASIC METABOLIC PANEL - Abnormal; Notable for the following components:      Result Value   CO2 17 (*)    Glucose, Bld 374 (*)    BUN 36 (*)    Creatinine, Ser 1.85 (*)    GFR, Estimated 38 (*)    All other components within normal limits  CBC - Abnormal; Notable for the following components:   RBC 3.63 (*)    Hemoglobin 10.0 (*)    HCT 31.4 (*)    All other components within normal limits  URINALYSIS, ROUTINE W REFLEX MICROSCOPIC - Abnormal; Notable for the following components:   APPearance CLOUDY (*)    Glucose, UA >=500 (*)    Hgb urine dipstick MODERATE (*)    Protein, ur 100 (*)    Leukocytes,Ua LARGE (*)    RBC / HPF >50 (*)    WBC, UA >50 (*)    Bacteria, UA FEW (*)    All other components within normal limits  HEPATIC FUNCTION PANEL - Abnormal; Notable for the following components:   Albumin 3.0 (*)    All other components within normal limits  CBG MONITORING, ED - Abnormal; Notable for the following components:   Glucose-Capillary 321 (*)    All other components within normal limits  CBG MONITORING, ED - Abnormal; Notable for the following components:   Glucose-Capillary 294 (*)    All other components within normal limits  I-STAT CHEM 8, ED - Abnormal; Notable for the following components:   BUN 33 (*)    Creatinine, Ser 1.90 (*)    Glucose, Bld 349 (*)    TCO2 17 (*)    Hemoglobin 10.2 (*)    HCT 30.0 (*)    All other components within normal limits  URINE CULTURE  LIPASE, BLOOD  I-STAT BETA HCG BLOOD, ED ([facility], [facility], [facility] ONLY)  CBG MONITORING, ED    EKG None  Radiology No results found.  Procedures Procedures   Medications Ordered in ED Medications  cefTRIAXone (ROCEPHIN) 1 g in sodium chloride 0.9 % 100 mL IVPB (has no administration in time range)  sodium  chloride 0.9 % bolus 1,000 mL (0 mLs Intravenous Stopped 09/12/20 1418)   ondansetron (ZOFRAN) injection 4 mg (4 mg Intravenous Given 09/12/20 1220)    ED Course  I have reviewed the triage vital signs and the nursing notes.  Pertinent labs & imaging results that were available during my care of the patient were reviewed by me and considered in my medical decision making (see chart for details).   Patient improved with nausea medicine and fluids.  Labs show elevated glucose with possible UTI. MDM Rules/Calculators/A&P                          Patient will be sent home with nausea medicine and antibiotics for possible UTI and vomiting and follow-up with her PCP Final Clinical Impression(s) / ED Diagnoses Final diagnoses:  Dehydration  Acute cystitis without hematuria    Rx / DC Orders ED Discharge Orders         Ordered    ondansetron (ZOFRAN ODT) 4 MG disintegrating tablet        09/12/20 1526    sulfamethoxazole-trimethoprim (BACTRIM DS) 800-160 MG tablet  2 times daily        09/12/20 1526           Milton Ferguson, MD 09/14/20 1040

## 2020-09-12 NOTE — ED Notes (Signed)
Pt ambulated to the restroom independently, steady gait, states she feels less dizzy then she did earlier

## 2020-09-12 NOTE — ED Triage Notes (Signed)
Pt w/ hx of diabetes complains of lightheadedness and nausea x 2 days. She threw up yesterday at work. Also has back pain.

## 2020-09-12 NOTE — ED Notes (Signed)
Brought pt warm blanket

## 2020-09-14 LAB — URINE CULTURE

## 2020-09-16 ENCOUNTER — Observation Stay (HOSPITAL_COMMUNITY)
Admission: EM | Admit: 2020-09-16 | Discharge: 2020-09-17 | Disposition: A | Discharge status: Home / Self Care | Payer: Medicaid Other | Attending: Emergency Medicine | Admitting: Emergency Medicine

## 2020-09-16 ENCOUNTER — Encounter (HOSPITAL_COMMUNITY): Payer: Self-pay | Admitting: Emergency Medicine

## 2020-09-16 ENCOUNTER — Other Ambulatory Visit: Payer: Self-pay

## 2020-09-16 DIAGNOSIS — E108 Type 1 diabetes mellitus with unspecified complications: Secondary | ICD-10-CM

## 2020-09-16 DIAGNOSIS — J45909 Unspecified asthma, uncomplicated: Secondary | ICD-10-CM | POA: Insufficient documentation

## 2020-09-16 DIAGNOSIS — N1831 Chronic kidney disease, stage 3a: Secondary | ICD-10-CM | POA: Diagnosis not present

## 2020-09-16 DIAGNOSIS — E1140 Type 2 diabetes mellitus with diabetic neuropathy, unspecified: Secondary | ICD-10-CM | POA: Diagnosis present

## 2020-09-16 DIAGNOSIS — E1049 Type 1 diabetes mellitus with other diabetic neurological complication: Secondary | ICD-10-CM

## 2020-09-16 DIAGNOSIS — E11319 Type 2 diabetes mellitus with unspecified diabetic retinopathy without macular edema: Secondary | ICD-10-CM | POA: Diagnosis present

## 2020-09-16 DIAGNOSIS — E1010 Type 1 diabetes mellitus with ketoacidosis without coma: Principal | ICD-10-CM

## 2020-09-16 DIAGNOSIS — E1065 Type 1 diabetes mellitus with hyperglycemia: Secondary | ICD-10-CM

## 2020-09-16 DIAGNOSIS — E1022 Type 1 diabetes mellitus with diabetic chronic kidney disease: Secondary | ICD-10-CM

## 2020-09-16 DIAGNOSIS — Z20822 Contact with and (suspected) exposure to covid-19: Secondary | ICD-10-CM | POA: Insufficient documentation

## 2020-09-16 DIAGNOSIS — N390 Urinary tract infection, site not specified: Secondary | ICD-10-CM | POA: Diagnosis not present

## 2020-09-16 DIAGNOSIS — B9689 Other specified bacterial agents as the cause of diseases classified elsewhere: Secondary | ICD-10-CM | POA: Diagnosis not present

## 2020-09-16 DIAGNOSIS — N179 Acute kidney failure, unspecified: Secondary | ICD-10-CM

## 2020-09-16 DIAGNOSIS — Z794 Long term (current) use of insulin: Secondary | ICD-10-CM | POA: Diagnosis not present

## 2020-09-16 DIAGNOSIS — E13319 Other specified diabetes mellitus with unspecified diabetic retinopathy without macular edema: Secondary | ICD-10-CM | POA: Diagnosis not present

## 2020-09-16 DIAGNOSIS — R112 Nausea with vomiting, unspecified: Secondary | ICD-10-CM | POA: Diagnosis present

## 2020-09-16 DIAGNOSIS — N184 Chronic kidney disease, stage 4 (severe): Secondary | ICD-10-CM

## 2020-09-16 DIAGNOSIS — E1110 Type 2 diabetes mellitus with ketoacidosis without coma: Secondary | ICD-10-CM | POA: Diagnosis present

## 2020-09-16 LAB — CREATININE, SERUM
Creatinine, Ser: 2.34 mg/dL — ABNORMAL HIGH (ref 0.44–1.00)
GFR, Estimated: 28 mL/min — ABNORMAL LOW (ref >60)

## 2020-09-16 LAB — CBC WITH DIFFERENTIAL/PLATELET
Abs Immature Granulocytes: 0.02 10*3/uL (ref 0.00–0.07)
Basophils Absolute: 0 10*3/uL (ref 0.0–0.1)
Basophils Relative: 0 %
Eosinophils Absolute: 0.1 10*3/uL (ref 0.0–0.5)
Eosinophils Relative: 1 %
HCT: 30.5 % — ABNORMAL LOW (ref 36.0–46.0)
Hemoglobin: 9.8 g/dL — ABNORMAL LOW (ref 12.0–15.0)
Immature Granulocytes: 0 %
Lymphocytes Relative: 11 %
Lymphs Abs: 0.9 10*3/uL (ref 0.7–4.0)
MCH: 27.3 pg (ref 26.0–34.0)
MCHC: 32.1 g/dL (ref 30.0–36.0)
MCV: 85 fL (ref 80.0–100.0)
Monocytes Absolute: 0.1 10*3/uL (ref 0.1–1.0)
Monocytes Relative: 2 %
Neutro Abs: 6.6 10*3/uL (ref 1.7–7.7)
Neutrophils Relative %: 86 %
Platelets: 294 10*3/uL (ref 150–400)
RBC: 3.59 MIL/uL — ABNORMAL LOW (ref 3.87–5.11)
RDW: 13.5 % (ref 11.5–15.5)
WBC: 7.8 10*3/uL (ref 4.0–10.5)
nRBC: 0 % (ref 0.0–0.2)

## 2020-09-16 LAB — URINALYSIS, ROUTINE W REFLEX MICROSCOPIC
Bacteria, UA: NONE SEEN
Bilirubin Urine: NEGATIVE
Glucose, UA: >=500 mg/dL — AB
Ketones, ur: 20 mg/dL — AB
Nitrite: NEGATIVE
Protein, ur: 100 mg/dL — AB
Specific Gravity, Urine: 1.017 (ref 1.005–1.030)
pH: 6 (ref 5.0–8.0)

## 2020-09-16 LAB — BASIC METABOLIC PANEL
Anion gap: 20 — ABNORMAL HIGH (ref 5–15)
Anion gap: 14 (ref 5–15)
Anion gap: 10 (ref 5–15)
BUN: 46 mg/dL — ABNORMAL HIGH (ref 6–20)
BUN: 43 mg/dL — ABNORMAL HIGH (ref 6–20)
BUN: 40 mg/dL — ABNORMAL HIGH (ref 6–20)
CO2: 15 mmol/L — ABNORMAL LOW (ref 22–32)
CO2: 17 mmol/L — ABNORMAL LOW (ref 22–32)
CO2: 21 mmol/L — ABNORMAL LOW (ref 22–32)
Calcium: 9 mg/dL (ref 8.9–10.3)
Calcium: 8.5 mg/dL — ABNORMAL LOW (ref 8.9–10.3)
Calcium: 9 mg/dL (ref 8.9–10.3)
Chloride: 95 mmol/L — ABNORMAL LOW (ref 98–111)
Chloride: 102 mmol/L (ref 98–111)
Chloride: 106 mmol/L (ref 98–111)
Creatinine, Ser: 2.41 mg/dL — ABNORMAL HIGH (ref 0.44–1.00)
Creatinine, Ser: 2.36 mg/dL — ABNORMAL HIGH (ref 0.44–1.00)
Creatinine, Ser: 2.19 mg/dL — ABNORMAL HIGH (ref 0.44–1.00)
GFR, Estimated: 27 mL/min — ABNORMAL LOW (ref >60)
GFR, Estimated: 28 mL/min — ABNORMAL LOW (ref >60)
GFR, Estimated: 31 mL/min — ABNORMAL LOW (ref >60)
Glucose, Bld: 877 mg/dL (ref 70–99)
Glucose, Bld: 583 mg/dL (ref 70–99)
Glucose, Bld: 278 mg/dL — ABNORMAL HIGH (ref 70–99)
Potassium: 5.2 mmol/L — ABNORMAL HIGH (ref 3.5–5.1)
Potassium: 4.4 mmol/L (ref 3.5–5.1)
Potassium: 4.1 mmol/L (ref 3.5–5.1)
Sodium: 130 mmol/L — ABNORMAL LOW (ref 135–145)
Sodium: 133 mmol/L — ABNORMAL LOW (ref 135–145)
Sodium: 137 mmol/L (ref 135–145)

## 2020-09-16 LAB — CBG MONITORING, ED
Glucose-Capillary: >600 mg/dL (ref 70–99)
Glucose-Capillary: >600 mg/dL (ref 70–99)
Glucose-Capillary: >600 mg/dL (ref 70–99)
Glucose-Capillary: 465 mg/dL — ABNORMAL HIGH (ref 70–99)
Glucose-Capillary: 506 mg/dL (ref 70–99)
Glucose-Capillary: 509 mg/dL (ref 70–99)

## 2020-09-16 LAB — RESP PANEL BY RT-PCR (FLU A&B, COVID) ARPGX2
Influenza A by PCR: NEGATIVE
Influenza B by PCR: NEGATIVE
SARS Coronavirus 2 by RT PCR: NEGATIVE

## 2020-09-16 LAB — BLOOD GAS, VENOUS
Acid-base deficit: 10.2 mmol/L — ABNORMAL HIGH (ref 0.0–2.0)
Bicarbonate: 15.6 mmol/L — ABNORMAL LOW (ref 20.0–28.0)
O2 Saturation: 61.9 %
Patient temperature: 98.6
pCO2, Ven: 36.1 mmHg — ABNORMAL LOW (ref 44.0–60.0)
pH, Ven: 7.26 (ref 7.250–7.430)
pO2, Ven: 38.3 mmHg (ref 32.0–45.0)

## 2020-09-16 LAB — MRSA PCR SCREENING: MRSA by PCR: NEGATIVE

## 2020-09-16 LAB — HIV ANTIBODY (ROUTINE TESTING W REFLEX): HIV Screen 4th Generation wRfx: NONREACTIVE

## 2020-09-16 LAB — GLUCOSE, CAPILLARY
Glucose-Capillary: 390 mg/dL — ABNORMAL HIGH (ref 70–99)
Glucose-Capillary: 310 mg/dL — ABNORMAL HIGH (ref 70–99)
Glucose-Capillary: 260 mg/dL — ABNORMAL HIGH (ref 70–99)
Glucose-Capillary: 120 mg/dL — ABNORMAL HIGH (ref 70–99)

## 2020-09-16 LAB — LIPASE, BLOOD: Lipase: 21 U/L (ref 11–51)

## 2020-09-16 LAB — BETA-HYDROXYBUTYRIC ACID: Beta-Hydroxybutyric Acid: 6.59 mmol/L — ABNORMAL HIGH (ref 0.05–0.27)

## 2020-09-16 MED ORDER — SODIUM CHLORIDE 0.9 % IV SOLN: INTRAVENOUS | Status: DC

## 2020-09-16 MED ORDER — PANTOPRAZOLE SODIUM 40 MG IV SOLR
40.0000 mg | INTRAVENOUS | Status: DC
  Administered 2020-09-16: 40 mg via INTRAVENOUS
  Filled 2020-09-16: qty 40

## 2020-09-16 MED ORDER — LACTATED RINGERS IV BOLUS: 20.0000 mL/kg | Freq: Once | INTRAVENOUS | Status: DC

## 2020-09-16 MED ORDER — CHLORHEXIDINE GLUCONATE CLOTH 2 % EX PADS
6.0000 | MEDICATED_PAD | Freq: Every day | CUTANEOUS | Status: DC
  Administered 2020-09-16 – 2020-09-17 (×2): 6 via TOPICAL

## 2020-09-16 MED ORDER — DEXTROSE IN LACTATED RINGERS 5 % IV SOLN: INTRAVENOUS | Status: DC

## 2020-09-16 MED ORDER — ACETAMINOPHEN 650 MG RE SUPP: 650.0000 mg | Freq: Four times a day (QID) | RECTAL | Status: DC | PRN

## 2020-09-16 MED ORDER — INSULIN REGULAR(HUMAN) IN NACL 100-0.9 UT/100ML-% IV SOLN
INTRAVENOUS | Status: DC
  Administered 2020-09-16: 5 [IU]/h via INTRAVENOUS

## 2020-09-16 MED ORDER — SODIUM CHLORIDE 0.9 % IV SOLN
1.0000 g | Freq: Once | INTRAVENOUS | Status: AC
  Administered 2020-09-16: 1 g via INTRAVENOUS
  Filled 2020-09-16: qty 10

## 2020-09-16 MED ORDER — ONDANSETRON HCL 4 MG/2ML IJ SOLN: 4.0000 mg | Freq: Four times a day (QID) | INTRAMUSCULAR | Status: DC | PRN

## 2020-09-16 MED ORDER — LACTATED RINGERS IV SOLN: INTRAVENOUS | Status: DC

## 2020-09-16 MED ORDER — ONDANSETRON HCL 4 MG/2ML IJ SOLN
4.0000 mg | Freq: Once | INTRAMUSCULAR | Status: AC
  Administered 2020-09-16: 4 mg via INTRAVENOUS
  Filled 2020-09-16: qty 2

## 2020-09-16 MED ORDER — INSULIN REGULAR(HUMAN) IN NACL 100-0.9 UT/100ML-% IV SOLN
INTRAVENOUS | Status: DC
  Administered 2020-09-16: 5 [IU]/h via INTRAVENOUS
  Filled 2020-09-16: qty 100

## 2020-09-16 MED ORDER — SODIUM CHLORIDE 0.9 % IV BOLUS
1000.0000 mL | Freq: Once | INTRAVENOUS | Status: AC
  Administered 2020-09-16: 1000 mL via INTRAVENOUS

## 2020-09-16 MED ORDER — ENOXAPARIN SODIUM 40 MG/0.4ML IJ SOSY
40.0000 mg | PREFILLED_SYRINGE | INTRAMUSCULAR | Status: DC
  Administered 2020-09-16: 40 mg via SUBCUTANEOUS
  Filled 2020-09-16: qty 0.4

## 2020-09-16 MED ORDER — ONDANSETRON HCL 4 MG PO TABS: 4.0000 mg | ORAL_TABLET | Freq: Four times a day (QID) | ORAL | Status: DC | PRN

## 2020-09-16 MED ORDER — DEXTROSE 50 % IV SOLN: 0.0000 mL | INTRAVENOUS | Status: DC | PRN

## 2020-09-16 MED ORDER — ACETAMINOPHEN 325 MG PO TABS: 650.0000 mg | ORAL_TABLET | Freq: Four times a day (QID) | ORAL | Status: DC | PRN

## 2020-09-16 NOTE — ED Triage Notes (Addendum)
Per EMS, patient from home, c/o hyperglycemia x3 days. CBG >600. 20g L FA  Patient adds symptoms started after beginning bactrim to treat UTI. C/o diarrhea.

## 2020-09-16 NOTE — ED Notes (Signed)
Called ICU to check on nurse assignment for admission, unit secretary states that charge RN has not been able to look at pt assignment yet. Will call back later

## 2020-09-16 NOTE — ED Provider Notes (Signed)
[facility] DEPT Provider Note   CSN: [id number] Arrival date & time: 09/16/20  1243     History Chief Complaint  Patient presents with  . Hyperglycemia    Ashley Freeman is a 30 y.o. female past ministry of diabetes, DKA, hyperlipidemia who presents for evaluation of nausea/vomiting/diarrhea.  She says been ongoing for about 3 to 4 days.  She was seen here on 09/12/2020 and was treated for UTI.  Patient reports that she has not been able to tolerate her antibiotics because she has been vomiting.  She states that she has not been able to keep anything down.  No blood noted in emesis or diarrhea.  She states that she has had some right lower abdominal pain and states that sometimes radiates into her right back.  She has not noted any more dysuria, hematuria.  She has not had any fevers.  She has been trying to take her insulin but states that she was feeling too sick to do so.  Denies any chest pain, difficulty breathing.  The history is provided by the patient.       Past Medical History:  Diagnosis Date  . Abscess, gluteal, right 08/24/2013  . AKI (acute kidney injury) ([place]) 07/26/2014  . Anemia 02/19/2012  . Bartholin's gland abscess 09/19/2013  . BV (bacterial vaginosis) 11/24/2015  . Diabetes mellitus type I ([place]) 2001   Diagnosed at age 40 ; Type I  . Diarrhea 05/30/2016  . DKA (diabetic ketoacidoses) 08/19/2013   Also in 2018  . Gonorrhea 08/2011   Treated in 09/2011  . History of trichomoniasis 05/31/2016  . Hyperlipidemia 03/28/2016  . Sepsis ([place]) 09/19/2013    Patient Active Problem List   Diagnosis Date Noted  . DKA (diabetic ketoacidosis) ([place]) 09/16/2020  . Type 1 diabetes mellitus with stage 3a chronic kidney disease ([place]) 07/18/2020  . History of pre-eclampsia 10/05/2019  . Transaminitis 10/05/2019  . Elevated serum creatinine 10/05/2019  . Bilateral leg edema 09/27/2019  . Systolic ejection murmur 25/42/7062  .  Shortness of breath 08/03/2019  . Type 1 diabetes mellitus with hyperglycemia ([place]) 07/24/2019  . Diabetic retinopathy ([place]) 07/02/2019  . Proteinuria due to type 1 diabetes mellitus ([place]) 05/21/2019  . Diabetic neuropathy ([place]) 01/16/2016  . Diabetes mellitus type 1, uncontrolled, with complications ([place]) 37/62/8315    Past Surgical History:  Procedure Laterality Date  . CESAREAN SECTION N/A 10/05/2019   Procedure: CESAREAN SECTION;  Surgeon: Aletha Halim, MD;  Location: [facility];  Service: Obstetrics;  Laterality: N/A;  . INCISION AND DRAINAGE ABSCESS Left 09/28/2019   Procedure: INCISION AND DRAINAGE VULVAR ABCESS;  Surgeon: [name], MD;  Location: [facility];  Service: Gynecology;  Laterality: Left;  . INCISION AND DRAINAGE PERIRECTAL ABSCESS Right 08/18/2013   Procedure: IRRIGATION AND DEBRIDEMENT GLUTEAL ABSCESS;  Surgeon: Ralene Ok, MD;  Location: [facility];  Service: General;  Laterality: Right;  . INCISION AND DRAINAGE PERIRECTAL ABSCESS Right 09/19/2013   Procedure: IRRIGATION AND DEBRIDEMENT RIGHT GLUTEAL AND LABIAL ABSCESSES;  Surgeon: Ralene Ok, MD;  Location: [facility];  Service: General;  Laterality: Right;  . INCISION AND DRAINAGE PERIRECTAL ABSCESS Right 09/24/2013   Procedure: IRRIGATION AND DEBRIDEMENT PERIRECTAL ABSCESS;  Surgeon: Gwenyth Ober, MD;  Location: [facility];  Service: General;  Laterality: Right;     OB History    Gravida  2   Para  1   Term      Preterm  1   AB  1   Living  1     SAB  1   IAB      Ectopic      Multiple  0   Live Births  1           Family History  Problem Relation Age of Onset  . Asthma Mother   . Carpal tunnel syndrome Mother   . Gout Father   . Diabetes Paternal Grandmother   . Anesthesia problems Neg Hx     Social History   Tobacco Use  . Smoking status: Never Smoker  . Smokeless tobacco: Never Used  Vaping Use  . Vaping Use: Never used  Substance Use Topics  . Alcohol use: Not Currently  .  Drug use: No    Home Medications Prior to Admission medications   Medication Sig Start Date End Date Taking? Authorizing Provider  albuterol (VENTOLIN HFA) 108 (90 Base) MCG/ACT inhaler Inhale 2 puffs into the lungs every 4 (four) hours as needed for wheezing or shortness of breath. 08/09/20  Yes [provider]  cetirizine-pseudoephedrine (ZYRTEC-D) 5-120 MG tablet Take 1 tablet by mouth every 12 (twelve) hours as needed for allergies. 08/09/20  Yes [provider]  insulin aspart (NOVOLOG) 100 UNIT/ML injection Use via insulin pump daily. Max daily 30 units 06/20/20  Yes Philemon Kingdom, MD  Insulin Disposable Pump (OMNIPOD DASH 5 PACK PODS) MISC Change POD every 3 days. Please dispense 6 boxes 07/15/20  Yes Shamleffer, Melanie Crazier, MD  ondansetron (ZOFRAN ODT) 4 MG disintegrating tablet 57m ODT q4 hours prn nausea/vomit Patient taking differently: Take 4 mg by mouth every 4 (four) hours as needed for nausea or vomiting. 09/12/20  Yes ZMilton Ferguson MD  promethazine (PHENERGAN) 25 MG tablet Take 1 tablet (25 mg total) by mouth every 8 (eight) hours as needed for nausea or vomiting (if zofran fails). 08/25/20  Yes NVarney Biles MD  sulfamethoxazole-trimethoprim (BACTRIM DS) 800-160 MG tablet Take 1 tablet by mouth 2 (two) times daily for 7 days. 09/12/20 09/19/20 Yes ZMilton Ferguson MD  blood glucose meter kit and supplies KIT Dispense based on patient and insurance preference. Use up to four times daily as directed. (FOR ICD-9 250.00, 250.01). 05/25/17   HIsla Pence MD  Blood Pressure Monitoring (BLOOD PRESSURE KIT) DEVI 1 Device by Does not apply route as needed. 05/06/19   Anyanwu, USallyanne Havers MD  Continuous Blood Gluc Sensor (DEXCOM G6 SENSOR) MISC USE AS DIRECTED, CHANGE SENSOR EVERY 10 DAYS 08/16/20   Shamleffer, IMelanie Crazier MD  Continuous Blood Gluc Transmit (DEXCOM G6 TRANSMITTER) MISC USE ONE TRANSMITTER ONCE EVERY 90 DAYS 07/14/20   Shamleffer, IMelanie Crazier MD   Ferrous Sulfate (IRON) 325 (65 Fe) MG TABS Take 1 tablet (325 mg total) by mouth every other day. Patient not taking: Reported on 07/25/2020 06/17/19 07/25/20  FChauncey Mann MD  hydrochlorothiazide (HYDRODIURIL) 25 MG tablet Take 1 tablet (25 mg total) by mouth daily. Patient not taking: No sig reported 10/08/19 07/25/20  Constant, Peggy, MD    Allergies    Cephalexin, Penicillins, Benadryl [diphenhydramine], and Doxycycline  Review of Systems   Review of Systems  Constitutional: Positive for appetite change. Negative for fever.  Respiratory: Negative for cough and shortness of breath.   Cardiovascular: Negative for chest pain.  Gastrointestinal: Positive for abdominal pain, diarrhea, nausea and vomiting.  Genitourinary: Positive for flank pain. Negative for dysuria and hematuria.  Neurological: Negative for headaches.  All other systems reviewed and are negative.  Physical Exam Updated Vital Signs BP (!) 147/78   Pulse (!) 124   Temp 98.1 F (36.7 C) (Oral)   Resp 14   Ht '5\' 3"'  (1.6 m)   Wt 58.1 kg   LMP 09/06/2020   SpO2 100%   BMI 22.67 kg/m   Physical Exam Vitals and nursing note reviewed.  Constitutional:      Appearance: Normal appearance. She is well-developed.     Comments: Appears uncomfortable, NAD  HENT:     Head: Normocephalic and atraumatic.  Eyes:     General: Lids are normal.     Conjunctiva/sclera: Conjunctivae normal.     Pupils: Pupils are equal, round, and reactive to light.  Cardiovascular:     Rate and Rhythm: Regular rhythm. Tachycardia present.     Pulses: Normal pulses.     Heart sounds: Normal heart sounds. No murmur heard. No friction rub. No gallop.   Pulmonary:     Effort: Pulmonary effort is normal.     Breath sounds: Normal breath sounds.  Abdominal:     Palpations: Abdomen is soft. Abdomen is not rigid.     Tenderness: There is abdominal tenderness in the right lower quadrant. There is right CVA tenderness. There is no guarding.      Comments: Abdomen soft, nondistended.  Tenderness noted diffusely to the right lower quadrant.  She has some mild right-sided CVA tenderness.  No rigidity, guarding.  No left-sided CVA tenderness.  Musculoskeletal:        General: Normal range of motion.     Cervical back: Full passive range of motion without pain.  Skin:    General: Skin is warm and dry.     Capillary Refill: Capillary refill takes less than 2 seconds.  Neurological:     Mental Status: She is alert and oriented to person, place, and time.  Psychiatric:        Speech: Speech normal.     ED Results / Procedures / Treatments   Labs (all labs ordered are listed, but only abnormal results are displayed) Labs Reviewed  BASIC METABOLIC PANEL - Abnormal; Notable for the following components:      Result Value   Sodium 130 (*)    Potassium 5.2 (*)    Chloride 95 (*)    CO2 15 (*)    Glucose, Bld 877 (*)    BUN 46 (*)    Creatinine, Ser 2.41 (*)    GFR, Estimated 27 (*)    Anion gap 20 (*)    All other components within normal limits  CBC WITH DIFFERENTIAL/PLATELET - Abnormal; Notable for the following components:   RBC 3.59 (*)    Hemoglobin 9.8 (*)    HCT 30.5 (*)    All other components within normal limits  URINALYSIS, ROUTINE W REFLEX MICROSCOPIC - Abnormal; Notable for the following components:   Color, Urine STRAW (*)    APPearance HAZY (*)    Glucose, UA >=500 (*)    Hgb urine dipstick SMALL (*)    Ketones, ur 20 (*)    Protein, ur 100 (*)    Leukocytes,Ua TRACE (*)    All other components within normal limits  BLOOD GAS, VENOUS - Abnormal; Notable for the following components:   pCO2, Ven 36.1 (*)    Bicarbonate 15.6 (*)    Acid-base deficit 10.2 (*)    All other components within normal limits  BETA-HYDROXYBUTYRIC ACID - Abnormal; Notable for the following components:   Beta-Hydroxybutyric Acid  6.59 (*)    All other components within normal limits  CBG MONITORING, ED - Abnormal; Notable for  the following components:   Glucose-Capillary >600 (*)    All other components within normal limits  CBG MONITORING, ED - Abnormal; Notable for the following components:   Glucose-Capillary >600 (*)    All other components within normal limits  CBG MONITORING, ED - Abnormal; Notable for the following components:   Glucose-Capillary >600 (*)    All other components within normal limits  CBG MONITORING, ED - Abnormal; Notable for the following components:   Glucose-Capillary 465 (*)    All other components within normal limits  URINE CULTURE  RESP PANEL BY RT-PCR (FLU A&B, COVID) ARPGX2  LIPASE, BLOOD  BETA-HYDROXYBUTYRIC ACID  HIV ANTIBODY (ROUTINE TESTING W REFLEX)  CREATININE, SERUM  I-STAT BETA HCG BLOOD, ED ([facility], [facility], [facility] ONLY)    EKG None  Radiology No results found.  Procedures .Critical Care Performed by: Volanda Napoleon, PA-C Authorized by: Volanda Napoleon, PA-C   Critical care provider statement:    Critical care time (minutes):  35   Critical care was necessary to treat or prevent imminent or life-threatening deterioration of the following conditions:  Metabolic crisis   Critical care was time spent personally by me on the following activities:  Discussions with consultants, evaluation of patient's response to treatment, examination of patient, ordering and performing treatments and interventions, ordering and review of laboratory studies, ordering and review of radiographic studies, pulse oximetry, re-evaluation of patient's condition, obtaining history from patient or surrogate and review of old charts   I assumed direction of critical care for this patient from another provider in my specialty: yes     Care discussed with: admitting provider       Medications Ordered in ED Medications  dextrose 5 % in lactated ringers infusion (0 mLs Intravenous Hold 09/16/20 1517)  dextrose 50 % solution 0-50 mL (has no administration in time range)  insulin regular, human  (MYXREDLIN) 100 units/ 100 mL infusion (5 Units/hr Intravenous New Bag/Given 09/16/20 1520)  enoxaparin (LOVENOX) injection 40 mg (has no administration in time range)  acetaminophen (TYLENOL) tablet 650 mg (has no administration in time range)    Or  acetaminophen (TYLENOL) suppository 650 mg (has no administration in time range)  ondansetron (ZOFRAN) tablet 4 mg (has no administration in time range)    Or  ondansetron (ZOFRAN) injection 4 mg (has no administration in time range)  cefTRIAXone (ROCEPHIN) 1 g in sodium chloride 0.9 % 100 mL IVPB (0 g Intravenous Stopped 09/16/20 1637)  ondansetron (ZOFRAN) injection 4 mg (4 mg Intravenous Given 09/16/20 1425)  sodium chloride 0.9 % bolus 1,000 mL (0 mLs Intravenous Stopped 09/16/20 1637)    ED Course  I have reviewed the triage vital signs and the nursing notes.  Pertinent labs & imaging results that were available during my care of the patient were reviewed by me and considered in my medical decision making (see chart for details).    MDM Rules/Calculators/A&P                          30 year old female who presents for evaluation of nausea/vomiting/diarrhea.  No fevers.  Recently treated for UTI but has not been able to tolerate antibiotics at home.  History of diabetes and states she has not been taking her insulin.  On initial arrival, she appears uncomfortable but no acute distress.  She is  slightly tachycardic but is afebrile.  Vitals otherwise stable.  On exam, she has some mild right lower quadrant abdominal tenderness as well as right-sided CVA tenderness.  Concern for DKA given her history.  Also consider pyelonephritis given recent UTI.  Plan to check labs.  UA shows small hemoglobin, trace leukocytes.  BMP shows bicarb of 15, glucose of 877, potassium of 5.2, BUN of 46, creatinine of 2.41, anion gap of 20.  Her BUN and creatinine are slightly above baseline.  She typically is in the range from 1.4-1.9.  This is likely from fluid loss  from nausea/vomiting/diarrhea.  CBC shows hemoglobin of 9.8.  She has baseline history of anemia and is consistent.  Lipase unremarkable.  VBG shows pH of 7.260.  Work-up concerning for DKA.  Will initiate insulin drip. Will require admission.   Discussed patient with Dr. Cathlean Sauer (hospitalist) who accepts patient for admission.   Portions of this note were generated with [employer]. Dictation errors may occur despite best attempts at proofreading.   Final Clinical Impression(s) / ED Diagnoses Final diagnoses:  Diabetic ketoacidosis without coma associated with type 1 diabetes mellitus ([place])  AKI (acute kidney injury) [facility])    Rx / DC Orders ED Discharge Orders    None       Desma Mcgregor 09/16/20 1701    Drenda Freeze, MD 09/16/20 2232

## 2020-09-16 NOTE — ED Notes (Signed)
Called report to [place], [place]

## 2020-09-16 NOTE — ED Notes (Signed)
Delay in triage completion as patient is in the restroom at this time.

## 2020-09-16 NOTE — H&P (Signed)
History and Physical    Ashley Freeman ZOX:[id number] DOB: 1990-09-08 DOA: 09/16/2020  PCP: Verlee Rossetti, PA-C   Patient coming from: Home   Chief Complaint: nausea, vomiting and abdominal pain.   HPI: Ashley Freeman is a 30 y.o. female with medical history significant of type 1 diabetes mellitus on insulin pump who presents with worsening nausea, vomiting, abdominal pain and generalized weakness.  05/16 patient presented to the ED with nausea and vomiting, she was diagnosed with urinary tract infection and hyperglycemia.  Received intravenous fluids, 1 dose of ceftriaxone and discharged home on Bactrim.  2 days later she ran out of the connector to her insulin pump and she had no other alternative to use subcutaneous insulin.  While using subcutaneous insulin her glucose has been elevated in the 300-400 range.  She developed generalized weakness, nausea, and vomiting, still able to have ice chips and her oral antibiotic therapy. This morning while at work her symptoms were severe in intensity, significant nausea, vomiting, abdominal pain and generalized weakness, no improving or worsening factors, associated with dry mouth and extremity elevated capillary glucose. Patient called EMS and she was brought to the hospital for further evaluation.  Usually her glucose is controlled with the use of insulin pump, but difficult to control with subcutaneous insulin. Her urinary symptoms had been improving with the use of oral antibiotic therapy.  Currently only mild left flank pain.  ED Course: Patient was diagnosed with diabetes ketoacidosis she received 1 L of intravenous isotonic saline and started on insulin drip.  Review of Systems:  1. General: No fevers, no chills, no weight gain or weight loss. Generalized weakness.  2. ENT: No runny nose or sore throat, no hearing disturbances 3. Pulmonary: No dyspnea, cough, wheezing, or hemoptysis 4. Cardiovascular: No  angina, claudication, lower extremity edema, pnd or orthopnea 5. Gastrointestinal: positive nausea and vomiting, with no diarrhea or constipation 6. Hematology: No easy bruisability or frequent infections 7. Urology: No dysuria, hematuria or increased urinary frequency 8. Dermatology: No rashes. 9. Neurology: No seizures or paresthesias 10. Musculoskeletal: No joint pain or deformities  Past Medical History:  Diagnosis Date  . Abscess, gluteal, right 08/24/2013  . AKI (acute kidney injury) (HCC) 07/26/2014  . Anemia 02/19/2012  . Bartholin's gland abscess 09/19/2013  . BV (bacterial vaginosis) 11/24/2015  . Diabetes mellitus type I (HCC) 2001   Diagnosed at age 33 ; Type I  . Diarrhea 05/30/2016  . DKA (diabetic ketoacidoses) 08/19/2013   Also in 2018  . Gonorrhea 08/2011   Treated in 09/2011  . History of trichomoniasis 05/31/2016  . Hyperlipidemia 03/28/2016  . Sepsis (HCC) 09/19/2013    Past Surgical History:  Procedure Laterality Date  . CESAREAN SECTION N/A 10/05/2019   Procedure: CESAREAN SECTION;  Surgeon: Littlefield Bing, MD;  Location: [facility] LD ORS;  Service: Obstetrics;  Laterality: N/A;  . INCISION AND DRAINAGE ABSCESS Left 09/28/2019   Procedure: INCISION AND DRAINAGE VULVAR ABCESS;  Surgeon: Tilda Burrow, MD;  Location[facility];  Service: Gynecology;  Laterality: Left;  . INCISION AND DRAINAGE PERIRECTAL ABSCESS Right 08/18/2013   Procedure: IRRIGATION AND DEBRIDEMENT GLUTEAL ABSCESS;  Surgeon: Axel Filler, MD;  Location: [facility] OR;  Service: General;  Laterality: Right;  . INCISION AND DRAINAGE PERIRECTAL ABSCESS Right 09/19/2013   Procedure: IRRIGATION AND DEBRIDEMENT RIGHT GLUTEAL AND LABIAL ABSCESSES;  Surgeon: Axel Filler, MD;  Location: [facility] OR;  Service: General;  Laterality: Right;  . INCISION AND DRAINAGE PERIRECTAL ABSCESS Right 09/24/2013  Procedure: IRRIGATION AND DEBRIDEMENT PERIRECTAL ABSCESS;  Surgeon: Cherylynn Ridges, MD;  Location: [facility] OR;  Service: General;   Laterality: Right;     reports that she has never smoked. She has never used smokeless tobacco. She reports previous alcohol use. She reports that she does not use drugs.  Allergies  Allergen Reactions  . Cephalexin Anaphylaxis  . Penicillins Hives and Rash    Has patient had a PCN reaction causing immediate rash, facial/tongue/throat swelling, SOB or lightheadedness with hypotension: Yes Has patient had a PCN reaction causing severe rash involving mucus membranes or skin necrosis: No Has patient had a PCN reaction that required hospitalization: Yes Has patient had a PCN reaction occurring within the last 10 years: No If all of the above answers are "NO", then may proceed with Cephalosporin use.  . Benadryl [Diphenhydramine] Itching  . Doxycycline Itching    Family History  Problem Relation Age of Onset  . Asthma Mother   . Carpal tunnel syndrome Mother   . Gout Father   . Diabetes Paternal Grandmother   . Anesthesia problems Neg Hx      Prior to Admission medications   Medication Sig Start Date End Date Taking? Authorizing Provider  albuterol (VENTOLIN HFA) 108 (90 Base) MCG/ACT inhaler Inhale 2 puffs into the lungs every 4 (four) hours as needed for wheezing or shortness of breath. 08/09/20  Yes [provider]  cetirizine-pseudoephedrine (ZYRTEC-D) 5-120 MG tablet Take 1 tablet by mouth every 12 (twelve) hours as needed for allergies. 08/09/20  Yes [provider]  insulin aspart (NOVOLOG) 100 UNIT/ML injection Use via insulin pump daily. Max daily 30 units 06/20/20  Yes Carlus Pavlov, MD  Insulin Disposable Pump (OMNIPOD DASH 5 PACK PODS) MISC Change POD every 3 days. Please dispense 6 boxes 07/15/20  Yes Shamleffer, Konrad Dolores, MD  ondansetron (ZOFRAN ODT) 4 MG disintegrating tablet 4mg  ODT q4 hours prn nausea/vomit Patient taking differently: Take 4 mg by mouth every 4 (four) hours as needed for nausea or vomiting. 09/12/20  Yes Bethann Berkshire, MD   promethazine (PHENERGAN) 25 MG tablet Take 1 tablet (25 mg total) by mouth every 8 (eight) hours as needed for nausea or vomiting (if zofran fails). 08/25/20  Yes Derwood Kaplan, MD  sulfamethoxazole-trimethoprim (BACTRIM DS) 800-160 MG tablet Take 1 tablet by mouth 2 (two) times daily for 7 days. 09/12/20 09/19/20 Yes Bethann Berkshire, MD  blood glucose meter kit and supplies KIT Dispense based on patient and insurance preference. Use up to four times daily as directed. (FOR ICD-9 250.00, 250.01). 05/25/17   Jacalyn Lefevre, MD  Blood Pressure Monitoring (BLOOD PRESSURE KIT) DEVI 1 Device by Does not apply route as needed. 05/06/19   Anyanwu, Jethro Bastos, MD  Continuous Blood Gluc Sensor (DEXCOM G6 SENSOR) MISC USE AS DIRECTED, CHANGE SENSOR EVERY 10 DAYS 08/16/20   Shamleffer, Konrad Dolores, MD  Continuous Blood Gluc Transmit (DEXCOM G6 TRANSMITTER) MISC USE ONE TRANSMITTER ONCE EVERY 90 DAYS 07/14/20   Shamleffer, Konrad Dolores, MD  Ferrous Sulfate (IRON) 325 (65 Fe) MG TABS Take 1 tablet (325 mg total) by mouth every other day. Patient not taking: Reported on 07/25/2020 06/17/19 07/25/20  Joselyn Arrow, MD  hydrochlorothiazide (HYDRODIURIL) 25 MG tablet Take 1 tablet (25 mg total) by mouth daily. Patient not taking: No sig reported 10/08/19 07/25/20  Catalina Antigua, MD    Physical Exam: Vitals:   09/16/20 1515 09/16/20 1530 09/16/20 1545 09/16/20 1600  BP:  (!) 152/77  (!) 147/78  Pulse: (!) 122 (!) 123 (!) 126 (!) 124  Resp: 16 16  14   Temp:      TempSrc:      SpO2: 100% 100% 100% 100%  Weight:      Height:        Vitals:   09/16/20 1515 09/16/20 1530 09/16/20 1545 09/16/20 1600  BP:  (!) 152/77  (!) 147/78  Pulse: (!) 122 (!) 123 (!) 126 (!) 124  Resp: 16 16  14   Temp:      TempSrc:      SpO2: 100% 100% 100% 100%  Weight:      Height:       General: deconditioned and ill looking appearing  Neurology: Awake and alert, non focal Head and Neck. Head normocephalic. Neck supple with no  adenopathy or thyromegaly.   E ENT: mild pallor, no icterus, oral mucosa moist Cardiovascular: No JVD. S1-S2 present, rhythmic, no gallops, rubs, or murmurs. No lower extremity edema. Pulmonary: positive breath sounds bilaterally, adequate air movement, no wheezing, rhonchi or rales. Gastrointestinal. Abdomen soft and non tender. Mild left costovertebral angle tenderness  Skin. No rashes Musculoskeletal: no joint deformities    Labs on Admission: I have personally reviewed following labs and imaging studies  CBC: Recent Labs  Lab 09/12/20 1224 09/12/20 1231 09/16/20 1258  WBC 6.6  --  7.8  NEUTROABS  --   --  6.6  HGB 10.0* 10.2* 9.8*  HCT 31.4* 30.0* 30.5*  MCV 86.5  --  85.0  PLT 302  --  294   Basic Metabolic Panel: Recent Labs  Lab 09/12/20 1224 09/12/20 1231 09/16/20 1258  NA 135 137 130*  K 4.2 4.3 5.2*  CL 111 111 95*  CO2 17*  --  15*  GLUCOSE 374* 349* 877*  BUN 36* 33* 46*  CREATININE 1.85* 1.90* 2.41*  CALCIUM 8.9  --  9.0   GFR: Estimated Creatinine Clearance: 28.7 mL/min (A) (by C-G formula based on SCr of 2.41 mg/dL (H)). Liver Function Tests: Recent Labs  Lab 09/12/20 1204  AST 29  ALT 24  ALKPHOS 67  BILITOT 0.4  PROT 6.6  ALBUMIN 3.0*   Recent Labs  Lab 09/12/20 1204 09/16/20 1258  LIPASE 29 21   No results for input(s): AMMONIA in the last 168 hours. Coagulation Profile: No results for input(s): INR, PROTIME in the last 168 hours. Cardiac Enzymes: No results for input(s): CKTOTAL, CKMB, CKMBINDEX, TROPONINI in the last 168 hours. BNP (last 3 results) No results for input(s): PROBNP in the last 8760 hours. HbA1C: No results for input(s): HGBA1C in the last 72 hours. CBG: Recent Labs  Lab 09/12/20 1229 09/12/20 1539 09/16/20 1305 09/16/20 1428 09/16/20 1516  GLUCAP 294* 112* >600* >600* >600*   Lipid Profile: No results for input(s): CHOL, HDL, LDLCALC, TRIG, CHOLHDL, LDLDIRECT in the last 72 hours. Thyroid Function  Tests: No results for input(s): TSH, T4TOTAL, FREET4, T3FREE, THYROIDAB in the last 72 hours. Anemia Panel: No results for input(s): VITAMINB12, FOLATE, FERRITIN, TIBC, IRON, RETICCTPCT in the last 72 hours. Urine analysis:    Component Value Date/Time   COLORURINE STRAW (A) 09/16/2020 1307   APPEARANCEUR HAZY (A) 09/16/2020 1307   APPEARANCEUR Hazy 11/10/2013 2043   LABSPEC 1.017 09/16/2020 1307   LABSPEC 1.031 11/10/2013 2043   PHURINE 6.0 09/16/2020 1307   GLUCOSEU >=500 (A) 09/16/2020 1307   GLUCOSEU >=500 11/10/2013 2043   HGBUR SMALL (A) 09/16/2020 1307   BILIRUBINUR NEGATIVE 09/16/2020 1307  BILIRUBINUR negative 06/04/2018 1035   BILIRUBINUR Negative 11/24/2015 1443   BILIRUBINUR Negative 11/10/2013 2043   KETONESUR 20 (A) 09/16/2020 1307   PROTEINUR 100 (A) 09/16/2020 1307   UROBILINOGEN 0.2 09/14/2019 1732   NITRITE NEGATIVE 09/16/2020 1307   LEUKOCYTESUR TRACE (A) 09/16/2020 1307   LEUKOCYTESUR 1+ 11/10/2013 2043    Radiological Exams on Admission: No results found.  EKG: Independently reviewed. NA   Assessment/Plan Principal Problem:   DKA (diabetic ketoacidosis) (HCC) Active Problems:   Diabetes mellitus type 1, uncontrolled, with complications (HCC)   Diabetic neuropathy (HCC)   Diabetic retinopathy (HCC)   30 year old female with type 1 diabetes mellitus who recently has been diagnosed with urinary tract infection, taking Bactrim at home. About 48 hrs ago she ran out of the connector to her insulin pump.  She has been using subcutaneous insulin but unable to control her serum glucose.  Today her symptoms worsened including generalized weakness, nausea, vomiting and abdominal pain that prompted her to come to the hospital.  On her initial physical examination blood pressure 152/77, heart rate 123-26, respiratory rate 16, oxygen saturation 100%. She had dry mucous membranes, lungs clear to auscultation, heart S1-S2, present, rhythmic, soft abdomen, no lower  extremity edema, left costovertebral angle tenderness.  Venous pH 7.26, sodium 130, potassium 5.2, chloride 95, bicarb 15, glucose 877, BUN 46, creatinine 2.41, white count 7.8, hemoglobin 9.8, hematocrit 30.5, platelets 294. SARS COVID-19 pending.  Urinalysis specific gravity 1.017, 100 protein, 0-5 red cells, 0-5 white cells, > 500 glucose.   Ashley Freeman will be admitted to the hospital working diagnosis of diabetes ketoacidosis  1.  Diabetes ketoacidosis.  Patient will be admitted to the stepdown unit, continue medical management with insulin drip. Isotonic fluids at 100 mill per hour normal saline, once glucose less than 250 mg/dl transition to intravenous fluids with dextrose.  Close follow-up of acid-base balance, once anion gap close will plan to transition to Insulin subcutaneously via pump.  Have requested patient to have her family bring the connector to her pump that today has been refilled.  Potassium is 5.2, no repletion indicated at this point.  2.  Acute kidney injury, hyperkalemia, anion gap metabolic acidosis.  Prerenal renal failure, continue aggressive intravenous fluids, close monitoring kidney function electrolytes. Hold on further potassium supplementation due to hyperkalemia.  3.  Urinary tract infection.  No further symptoms, a urine analysis that no further pyuria, hold on further antibiotics.  4.  History of asthma.  No signs of acute exacerbation, at home uses albuterol rescue inhaler.  Status is: Inpatient  Remains inpatient appropriate because:IV treatments appropriate due to intensity of illness or inability to take PO   Dispo: The patient is from: Home              Anticipated d/c is to: Home              Patient currently is not medically stable to d/c.   Difficult to place patient No   DVT prophylaxis: Enoxaparin  Code Status:    full  Family Communication:   no family at the bedside     Consults called:   none   Admission status:    observation    Levette Paulick Annett Gula MD [facility]   09/16/2020, 4:16 PM

## 2020-09-16 NOTE — Plan of Care (Signed)
  Problem: Education: Goal: Knowledge of General Education information will improve Description: Including pain rating scale, medication(s)/side effects and non-pharmacologic comfort measures Outcome: Progressing   Problem: Health Behavior/Discharge Planning: Goal: Ability to manage health-related needs will improve Outcome: Progressing   Problem: Clinical Measurements: Goal: Ability to maintain clinical measurements within normal limits will improve Outcome: Progressing Goal: Will remain free from infection Outcome: Progressing Goal: Diagnostic test results will improve Outcome: Progressing Goal: Respiratory complications will improve Outcome: Progressing Goal: Cardiovascular complication will be avoided Outcome: Progressing   Problem: Activity: Goal: Risk for activity intolerance will decrease Outcome: Progressing   Problem: Nutrition: Goal: Adequate nutrition will be maintained Outcome: Progressing   Problem: Coping: Goal: Level of anxiety will decrease Outcome: Progressing   Problem: Elimination: Goal: Will not experience complications related to bowel motility Outcome: Progressing Goal: Will not experience complications related to urinary retention Outcome: Progressing   Problem: Pain Managment: Goal: General experience of comfort will improve Outcome: Progressing   Problem: Safety: Goal: Ability to remain free from injury will improve Outcome: Progressing   Problem: Skin Integrity: Goal: Risk for impaired skin integrity will decrease Outcome: Progressing   Problem: Education: Goal: Ability to describe self-care measures that may prevent or decrease complications (Diabetes Survival Skills Education) will improve Outcome: Progressing Goal: Individualized Educational Video(s) Outcome: Progressing   Problem: Cardiac: Goal: Ability to maintain an adequate cardiac output will improve Outcome: Progressing   Problem: Health Behavior/Discharge  Planning: Goal: Ability to identify and utilize available resources and services will improve Outcome: Progressing Goal: Ability to manage health-related needs will improve Outcome: Progressing   Problem: Fluid Volume: Goal: Ability to achieve a balanced intake and output will improve Outcome: Progressing   Problem: Metabolic: Goal: Ability to maintain appropriate glucose levels will improve Outcome: Progressing   Problem: Nutritional: Goal: Maintenance of adequate nutrition will improve Outcome: Progressing Goal: Maintenance of adequate weight for body size and type will improve Outcome: Progressing   Problem: Respiratory: Goal: Will regain and/or maintain adequate ventilation Outcome: Progressing   Problem: Urinary Elimination: Goal: Ability to achieve and maintain adequate renal perfusion and functioning will improve Outcome: Progressing   

## 2020-09-16 NOTE — Progress Notes (Signed)
Inpatient Diabetes Program Recommendations  AACE/ADA: New Consensus Statement on Inpatient Glycemic Control (2015)  Target Ranges:  Prepandial:   less than 140 mg/dL      Peak postprandial:   less than 180 mg/dL (1-2 hours)      Critically ill patients:  140 - 180 mg/dL   Results for ASHEY, TRAMONTANA (MRN [id number]) as of 09/16/2020 13:53  Ref. Range 09/16/2020 13:05  Glucose-Capillary Latest Ref Range: 70 - 99 mg/dL >600 [facility]   Results for CEDRICA, BRUNE (MRN [id number]) as of 09/16/2020 14:21  Ref. Range 09/16/2020 12:58  Sodium Latest Ref Range: 135 - 145 mmol/L 130 (L)  Potassium Latest Ref Range: 3.5 - 5.1 mmol/L 5.2 (H)  Chloride Latest Ref Range: 98 - 111 mmol/L 95 (L)  CO2 Latest Ref Range: 22 - 32 mmol/L 15 (L)  Glucose Latest Ref Range: 70 - 99 mg/dL 877 (HH)  BUN Latest Ref Range: 6 - 20 mg/dL 46 (H)  Creatinine Latest Ref Range: 0.44 - 1.00 mg/dL 2.41 (H)  Calcium Latest Ref Range: 8.9 - 10.3 mg/dL 9.0  Anion gap Latest Ref Range: 5 - 15  20 (H)   To ED with DKA  History: Type 1 diabetes, Multiple admits for DKA  Home DM Meds: Dexcom CGM         Omni Pod insulin pump  Current Orders: IV Insulin Drip  Pt Well known to the Inpatient Diabetes team--Multiple ED visits since 2021   MD- When DKA resolved, if pt's family brings pump supplies to hospital, pt can resume home insulin pump.  When BMET improved, would have pt restart pump with all new supplies and then can d/c the IV Insulin Drip 1 hour after pump restarted.  If pt can't get pump supplies to restart pump, then will need Lantus, Novolog SSI, and Novolog Meal Coverage orders.  Gets about 17 units total basal insulin on pump per 24 hour period.   Endocrinologist: Dr. Kelton Pillar with [facility]--last seen 07/15/2020 Insulin Pump Settings at this visit were as follows: Basal Rates:  12am- 0.5 unit/hr 8am- 0.6 unit/hr Total Basal per 24 hour period= 16.8 units Carb Ratio: 1 unit for every 12  grams Carbs Correction Ratio: 1 unit for every 65 mg/dl above goal CBG Goal CBG= 100 mg/dl   Met with pt down in the ED.  RNs were working on initiating the IV Insulin Drip.  Asked pt about her Omni Pod Insulin Pump.  Pt stated she took her insulin pump pod off because it was time to remove it.  Does not have extra pods for her insulin pump, but plans to ask family members to go to her pharmacy and pick the refill pods up and bring to the hospital.  Discussed with pt that if the MD allows her to resume her insulin pump (after resolution of her current DKA), she will need to supply her own pump supplies, otherwise the MD will order basal/bolus insulin regimen.  Pt stated understanding.     --Will follow patient during hospitalization--  Wyn Quaker RN, MSN, CDE Diabetes Coordinator Inpatient Glycemic Control Team Team Pager: [phone] (8a-5p)

## 2020-09-17 DIAGNOSIS — N184 Chronic kidney disease, stage 4 (severe): Secondary | ICD-10-CM

## 2020-09-17 DIAGNOSIS — N179 Acute kidney failure, unspecified: Secondary | ICD-10-CM

## 2020-09-17 DIAGNOSIS — N182 Chronic kidney disease, stage 2 (mild): Secondary | ICD-10-CM

## 2020-09-17 DIAGNOSIS — E108 Type 1 diabetes mellitus with unspecified complications: Secondary | ICD-10-CM | POA: Diagnosis not present

## 2020-09-17 DIAGNOSIS — E1010 Type 1 diabetes mellitus with ketoacidosis without coma: Secondary | ICD-10-CM | POA: Diagnosis not present

## 2020-09-17 DIAGNOSIS — E1022 Type 1 diabetes mellitus with diabetic chronic kidney disease: Secondary | ICD-10-CM | POA: Diagnosis not present

## 2020-09-17 LAB — GLUCOSE, CAPILLARY
Glucose-Capillary: 114 mg/dL — ABNORMAL HIGH (ref 70–99)
Glucose-Capillary: 120 mg/dL — ABNORMAL HIGH (ref 70–99)
Glucose-Capillary: 155 mg/dL — ABNORMAL HIGH (ref 70–99)
Glucose-Capillary: 160 mg/dL — ABNORMAL HIGH (ref 70–99)
Glucose-Capillary: 176 mg/dL — ABNORMAL HIGH (ref 70–99)
Glucose-Capillary: 195 mg/dL — ABNORMAL HIGH (ref 70–99)
Glucose-Capillary: 227 mg/dL — ABNORMAL HIGH (ref 70–99)

## 2020-09-17 LAB — BASIC METABOLIC PANEL
Anion gap: 8 (ref 5–15)
Anion gap: 8 (ref 5–15)
Anion gap: 10 (ref 5–15)
BUN: 37 mg/dL — ABNORMAL HIGH (ref 6–20)
BUN: 32 mg/dL — ABNORMAL HIGH (ref 6–20)
BUN: 31 mg/dL — ABNORMAL HIGH (ref 6–20)
CO2: 22 mmol/L (ref 22–32)
CO2: 21 mmol/L — ABNORMAL LOW (ref 22–32)
CO2: 19 mmol/L — ABNORMAL LOW (ref 22–32)
Calcium: 8.7 mg/dL — ABNORMAL LOW (ref 8.9–10.3)
Calcium: 8.7 mg/dL — ABNORMAL LOW (ref 8.9–10.3)
Calcium: 8.7 mg/dL — ABNORMAL LOW (ref 8.9–10.3)
Chloride: 107 mmol/L (ref 98–111)
Chloride: 108 mmol/L (ref 98–111)
Chloride: 106 mmol/L (ref 98–111)
Creatinine, Ser: 2 mg/dL — ABNORMAL HIGH (ref 0.44–1.00)
Creatinine, Ser: 1.78 mg/dL — ABNORMAL HIGH (ref 0.44–1.00)
Creatinine, Ser: 1.91 mg/dL — ABNORMAL HIGH (ref 0.44–1.00)
GFR, Estimated: 34 mL/min — ABNORMAL LOW (ref >60)
GFR, Estimated: 39 mL/min — ABNORMAL LOW (ref >60)
GFR, Estimated: 36 mL/min — ABNORMAL LOW (ref >60)
Glucose, Bld: 196 mg/dL — ABNORMAL HIGH (ref 70–99)
Glucose, Bld: 119 mg/dL — ABNORMAL HIGH (ref 70–99)
Glucose, Bld: 218 mg/dL — ABNORMAL HIGH (ref 70–99)
Potassium: 4.3 mmol/L (ref 3.5–5.1)
Potassium: 3.9 mmol/L (ref 3.5–5.1)
Potassium: 3.8 mmol/L (ref 3.5–5.1)
Sodium: 137 mmol/L (ref 135–145)
Sodium: 137 mmol/L (ref 135–145)
Sodium: 135 mmol/L (ref 135–145)

## 2020-09-17 MED ORDER — PANTOPRAZOLE SODIUM 40 MG PO TBEC
40.0000 mg | DELAYED_RELEASE_TABLET | Freq: Every day | ORAL | Status: DC
  Administered 2020-09-17: 40 mg via ORAL
  Filled 2020-09-17: qty 1

## 2020-09-17 MED ORDER — INSULIN PUMP
Freq: Three times a day (TID) | SUBCUTANEOUS | Status: DC
  Filled 2020-09-17: qty 1

## 2020-09-17 NOTE — Discharge Summary (Addendum)
Physician Discharge Summary  Elias Henigan [place] BPZ:[id number] DOB: [birth year] DOA: 09/16/2020  PCP: Verlee Rossetti, PA-C  Admit date: 09/16/2020 Discharge date: 09/17/2020  Admitted From: Home  Disposition:  Home   Recommendations for Outpatient Follow-up and new medication changes:  1. Follow up with Verlee Rossetti PA-C in 7 days.  2. Follow up renal function in 7 days.  3. Resume insulin pump for glucose control.  4. Patient will benefit from low dose ace inh or SGLT2 inhibitors for diabetic nephropathy, to start once renal function more stable.   Home Health: no   Equipment/Devices: no    Discharge Condition: stable  CODE STATUS: full  Diet recommendation:  Diabetic   Brief/Interim Summary: Mrs. Rayburn Go was admitted to the hospital with the working diagnosis of diabetes ketoacidosis  30 year old female with type 1 diabetes mellitus who recently has been diagnosed with urinary tract infection, taking Bactrim at home. About 48 hrs ago she ran out of the connector to her insulin pump.  She has been using subcutaneous insulin but unable to control her serum glucose.  On the day of admission her symptoms worsened including generalized weakness, nausea, vomiting and abdominal pain that prompted her to come to the hospital.  On her initial physical examination blood pressure 152/77, heart rate 123-26, respiratory rate 16, oxygen saturation 100%. She had dry mucous membranes, lungs clear to auscultation, heart S1-S2, present, rhythmic, soft abdomen, no lower extremity edema, mild left costovertebral angle tenderness.  Venous pH 7.26, sodium 130, potassium 5.2, chloride 95, bicarb 15, glucose 877, BUN 46, creatinine 2.41, anion gap 20, white count 7.8, hemoglobin 9.8, hematocrit 30.5, platelets 294. SARS COVID-19 pending.  Urinalysis specific gravity 1.017, 100 protein, 0-5 red cells, 0-5 white cells, > 500 glucose.   1. Diabetes ketoacidosis, uncontrolled T1DM.  Patient was admitted to the step dow unit, received intravenous fluids with isotonic saline and intravenous insulin.  Her glucose was controlled and her anion gap closed.  Her symptoms improved, no further nausea, vomiting or abdominal pain.  Her diet was advanced to regular, diabetic prudent with good toleration. Plan to transition to subcutaneous insulin via insulin pump before discharge.  She was advised to have a close follow-up as an outpatient.  In March her hemoglobin A1c was 9.4.  2.  Acute kidney injury on chronic kidney disease stage II, hyperkalemia, anion gap metabolic acidosis. Patient received supportive medical therapy with intravenous fluids, her kidney function is improving, her discharge creatinine is 1.78 with a BUN of 32 and serum bicarbonate of 21.  Once GFR more stable it is recommended to start ACE inhibitor for diabetic nephropathy.  Patient also may benefit from SGLT2 inhibitors.   She will need close follow-up as an outpatient.  3.  Urinary tract infection.  No further urinary symptoms, her urinalysis with no pyuria.  No further antibiotic indicated.  4.  History of asthma.  No signs of acute exacerbation.  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   Benadryl [diphenhydramine] Itching   Doxycycline Itching      Medication List    STOP taking these medications   sulfamethoxazole-trimethoprim 800-160 MG tablet Commonly known as: BACTRIM DS     TAKE these medications   albuterol 108 (90 Base) MCG/ACT inhaler Commonly known as: VENTOLIN HFA Inhale 2 puffs into the lungs every 4 (four) hours as needed for wheezing or shortness of breath.   blood glucose meter kit and supplies Kit Dispense based on patient and insurance preference. Use up to four times daily as directed. (FOR ICD-9 250.00, 250.01).   Blood Pressure Kit Devi 1 Device by Does not apply route as needed.   cetirizine-pseudoephedrine 5-120 MG tablet Commonly known as: ZYRTEC-D Take 1 tablet by mouth every 12 (twelve) hours as needed for allergies.   Dexcom G6 Sensor Misc USE AS DIRECTED, CHANGE SENSOR EVERY 10 DAYS   Dexcom G6 Transmitter Misc USE ONE TRANSMITTER ONCE EVERY 90 DAYS   insulin aspart 100 UNIT/ML injection Commonly known as: NovoLOG Use via insulin pump daily. Max daily 30 units   Omnipod DASH Pods (Gen 4) Misc Change POD every 3 days. Please dispense 6 boxes   ondansetron 4 MG disintegrating tablet Commonly known as: Zofran ODT 4mg  ODT q4 hours prn nausea/vomit What changed:   how much to take  how to take this  when to take this  reasons to take this  additional instructions   promethazine 25 MG tablet Commonly known as: PHENERGAN Take 1 tablet (25 mg total) by mouth every 8 (eight) hours as needed for nausea or vomiting (if zofran fails).       Allergies  Allergen Reactions  . Cephalexin Anaphylaxis  . Penicillins Hives and Rash    Has patient had a PCN reaction causing immediate rash, facial/tongue/throat swelling, SOB or lightheadedness with hypotension: Yes Has patient had a PCN reaction causing severe  rash involving mucus membranes or skin necrosis: No Has patient had a PCN reaction that required hospitalization: Yes Has patient had a PCN reaction occurring within the last 10 years: No If all of the above answers are "NO", then may proceed with Cephalosporin use.  . Benadryl [Diphenhydramine] Itching  . Doxycycline Itching    Subjective: Patient is feeling better, no nausea or vomiting, no chest pain or dyspnea.   Discharge Exam: Vitals:   09/17/20 0751 09/17/20 0752  BP:    Pulse: 97 99  Resp:    Temp:  98.3 F (36.8 C)  SpO2: 100% 100%   Vitals:   09/17/20 0749 09/17/20 0750 09/17/20 0751 09/17/20 0752  BP:      Pulse: 99 97 97 99  Resp:      Temp:    98.3 F (36.8 C)  TempSrc:    Oral  SpO2: 100% 100% 100% 100%  Weight:      Height:        General: Not in pain or dyspnea  Neurology: Awake and alert, non focal  E ENT: no pallor, no icterus, oral mucosa moist Cardiovascular: No JVD. S1-S2 present, rhythmic, no gallops, rubs, or murmurs. No lower extremity edema. Pulmonary: vesicular breath sounds bilaterally, adequate air movement, no  wheezing, rhonchi or rales. Gastrointestinal. Abdomen soft and non tender Skin. No rashes Musculoskeletal: no joint deformities   The results of significant diagnostics from this hospitalization (including imaging, microbiology, ancillary and laboratory) are listed below for reference.     Microbiology: Recent Results (from the past 240 hour(s))  Urine Culture     Status: Abnormal   Collection Time: 09/12/20  3:23 PM   Specimen: Urine, Random  Result Value Ref Range Status   Specimen Description   Final    URINE, RANDOM Performed at [facility], [address] [place], [place] [address]    Special Requests   Final    NONE Performed at [facility], [address] [place], [place] [address]    Culture MULTIPLE SPECIES PRESENT, SUGGEST RECOLLECTION (A)  Final   Report Status  09/14/2020 FINAL  Final  Resp Panel by RT-PCR (Flu A&B, Covid) Nasopharyngeal Swab     Status: None   Collection Time: 09/16/20  4:59 PM   Specimen: Nasopharyngeal Swab; Nasopharyngeal(NP) swabs in vial transport medium  Result Value Ref Range Status   SARS Coronavirus 2 by RT PCR NEGATIVE NEGATIVE Final    Comment: (NOTE) SARS-CoV-2 target nucleic acids are NOT DETECTED.  The SARS-CoV-2 RNA is generally detectable in upper respiratory specimens during the acute phase of infection. The lowest concentration of SARS-CoV-2 viral copies this assay can detect is 138 copies/mL. A negative result does not preclude SARS-Cov-2 infection and should not be used as the sole basis for treatment or other patient management decisions. A negative result may occur with  improper specimen collection/handling, submission of specimen other than nasopharyngeal swab, presence of viral mutation(s) within the areas targeted by this assay, and inadequate number of viral copies(<138 copies/mL). A negative result must be combined with clinical observations, patient history, and epidemiological information. The expected result is Negative.  Fact Sheet for Patients:  BloggerCourse.com  Fact Sheet for Healthcare Providers:  SeriousBroker.it  This test is no t yet approved or cleared by the [place] FDA and  has been authorized for detection and/or diagnosis of SARS-CoV-2 by FDA under an Emergency Use Authorization (EUA). This EUA will remain  in effect (meaning this test can be used) for the duration of the COVID-19 declaration under Section 564(b)(1) of the Act, 21 U.S.C.section 360bbb-3(b)(1), unless the authorization is terminated  or revoked sooner.       Influenza A by PCR NEGATIVE NEGATIVE Final   Influenza B by PCR NEGATIVE NEGATIVE Final    Comment: (NOTE) The Xpert Xpress SARS-CoV-2/FLU/RSV plus assay is intended as an aid in the diagnosis of  influenza from Nasopharyngeal swab specimens and should not be used as a sole basis for treatment. Nasal washings and aspirates are unacceptable for Xpert Xpress SARS-CoV-2/FLU/RSV testing.  Fact Sheet for Patients: BloggerCourse.com  Fact Sheet for Healthcare Providers: SeriousBroker.it  This test is not yet approved or cleared by the [place] FDA and has been authorized for detection and/or diagnosis of SARS-CoV-2 by FDA under an Emergency Use Authorization (EUA). This EUA will remain in effect (meaning this test can be used) for the duration of the COVID-19 declaration under Section 564(b)(1) of the Act, 21 U.S.C. section 360bbb-3(b)(1), unless the authorization is terminated or revoked.  Performed at [facility], [address] [place], [place] [address]   MRSA PCR Screening     Status: None   Collection Time: 09/16/20  6:59 PM   Specimen: Nasopharyngeal  Result Value  Ref Range Status   MRSA by PCR NEGATIVE NEGATIVE Final    Comment:        The GeneXpert MRSA Assay (FDA approved for NASAL specimens only), is one component of a comprehensive MRSA colonization surveillance program. It is not intended to diagnose MRSA infection nor to guide or monitor treatment for MRSA infections. Performed at [facility], [address], [place], [place] [address]      Labs: BNP (last 3 results) No results for input(s): BNP in the last 8760 hours. Basic Metabolic Panel: Recent Labs  Lab 09/16/20 1258 09/16/20 1408 09/16/20 1739 09/16/20 2059 09/17/20 0150 09/17/20 0514  NA 130*  --  133* 137 137 137  K 5.2*  --  4.4 4.1 4.3 3.9  CL 95*  --  102 106 107 108  CO2 15*  --  17* 21* 22 21*  GLUCOSE 877*  --  583* 278* 196* 119*  BUN 46*  --  43* 40* 37* 32*  CREATININE 2.41* 2.34* 2.36* 2.19* 2.00* 1.78*  CALCIUM 9.0  --  8.5* 9.0 8.7* 8.7*   Liver Function Tests: Recent Labs   Lab 09/12/20 1204  AST 29  ALT 24  ALKPHOS 67  BILITOT 0.4  PROT 6.6  ALBUMIN 3.0*   Recent Labs  Lab 09/12/20 1204 09/16/20 1258  LIPASE 29 21   No results for input(s): AMMONIA in the last 168 hours. CBC: Recent Labs  Lab 09/12/20 1224 09/12/20 1231 09/16/20 1258  WBC 6.6  --  7.8  NEUTROABS  --   --  6.6  HGB 10.0* 10.2* 9.8*  HCT 31.4* 30.0* 30.5*  MCV 86.5  --  85.0  PLT 302  --  294   Cardiac Enzymes: No results for input(s): CKTOTAL, CKMB, CKMBINDEX, TROPONINI in the last 168 hours. BNP: Invalid input(s): POCBNP CBG: Recent Labs  Lab 09/16/20 2355 09/17/20 0204 09/17/20 0358 09/17/20 0604 09/17/20 0750  GLUCAP 114* 195* 160* 120* 155*   D-Dimer No results for input(s): DDIMER in the last 72 hours. Hgb A1c No results for input(s): HGBA1C in the last 72 hours. Lipid Profile No results for input(s): CHOL, HDL, LDLCALC, TRIG, CHOLHDL, LDLDIRECT in the last 72 hours. Thyroid function studies No results for input(s): TSH, T4TOTAL, T3FREE, THYROIDAB in the last 72 hours.  Invalid input(s): FREET3 Anemia work up No results for input(s): VITAMINB12, FOLATE, FERRITIN, TIBC, IRON, RETICCTPCT in the last 72 hours. Urinalysis    Component Value Date/Time   COLORURINE STRAW (A) 09/16/2020 1307   APPEARANCEUR HAZY (A) 09/16/2020 1307   APPEARANCEUR Hazy 11/10/2013 2043   LABSPEC 1.017 09/16/2020 1307   LABSPEC 1.031 11/10/2013 2043   PHURINE 6.0 09/16/2020 1307   GLUCOSEU >=500 (A) 09/16/2020 1307   GLUCOSEU >=500 11/10/2013 2043   HGBUR SMALL (A) 09/16/2020 1307   BILIRUBINUR NEGATIVE 09/16/2020 1307   BILIRUBINUR negative 06/04/2018 1035   BILIRUBINUR Negative 11/24/2015 1443   BILIRUBINUR Negative 11/10/2013 2043   KETONESUR 20 (A) 09/16/2020 1307   PROTEINUR 100 (A) 09/16/2020 1307   UROBILINOGEN 0.2 09/14/2019 1732   NITRITE NEGATIVE 09/16/2020 1307   LEUKOCYTESUR TRACE (A) 09/16/2020 1307   LEUKOCYTESUR 1+ 11/10/2013 2043   Sepsis  Labs Invalid input(s): PROCALCITONIN,  WBC,  LACTICIDVEN Microbiology Recent Results (from the past 240 hour(s))  Urine Culture     Status: Abnormal   Collection Time: 09/12/20  3:23 PM   Specimen: Urine, Random  Result Value Ref Range Status   Specimen Description  Final    URINE, RANDOM Performed at [facility], [address] [place], [place] [address]    Special Requests   Final    NONE Performed at [facility], [address] [place], [place] [address]    Culture MULTIPLE SPECIES PRESENT, SUGGEST RECOLLECTION (A)  Final   Report Status 09/14/2020 FINAL  Final  Resp Panel by RT-PCR (Flu A&B, Covid) Nasopharyngeal Swab     Status: None   Collection Time: 09/16/20  4:59 PM   Specimen: Nasopharyngeal Swab; Nasopharyngeal(NP) swabs in vial transport medium  Result Value Ref Range Status   SARS Coronavirus 2 by RT PCR NEGATIVE NEGATIVE Final    Comment: (NOTE) SARS-CoV-2 target nucleic acids are NOT DETECTED.  The SARS-CoV-2 RNA is generally detectable in upper respiratory specimens during the acute phase of infection. The lowest concentration of SARS-CoV-2 viral copies this assay can detect is 138 copies/mL. A negative result does not preclude SARS-Cov-2 infection and should not be used as the sole basis for treatment or other patient management decisions. A negative result may occur with  improper specimen collection/handling, submission of specimen other than nasopharyngeal swab, presence of viral mutation(s) within the areas targeted by this assay, and inadequate number of viral copies(<138 copies/mL). A negative result must be combined with clinical observations, patient history, and epidemiological information. The expected result is Negative.  Fact Sheet for Patients:  BloggerCourse.com  Fact Sheet for Healthcare Providers:  SeriousBroker.it  This test is no t yet approved or  cleared by the [place] FDA and  has been authorized for detection and/or diagnosis of SARS-CoV-2 by FDA under an Emergency Use Authorization (EUA). This EUA will remain  in effect (meaning this test can be used) for the duration of the COVID-19 declaration under Section 564(b)(1) of the Act, 21 U.S.C.section 360bbb-3(b)(1), unless the authorization is terminated  or revoked sooner.       Influenza A by PCR NEGATIVE NEGATIVE Final   Influenza B by PCR NEGATIVE NEGATIVE Final    Comment: (NOTE) The Xpert Xpress SARS-CoV-2/FLU/RSV plus assay is intended as an aid in the diagnosis of influenza from Nasopharyngeal swab specimens and should not be used as a sole basis for treatment. Nasal washings and aspirates are unacceptable for Xpert Xpress SARS-CoV-2/FLU/RSV testing.  Fact Sheet for Patients: BloggerCourse.com  Fact Sheet for Healthcare Providers: SeriousBroker.it  This test is not yet approved or cleared by the [place] FDA and has been authorized for detection and/or diagnosis of SARS-CoV-2 by FDA under an Emergency Use Authorization (EUA). This EUA will remain in effect (meaning this test can be used) for the duration of the COVID-19 declaration under Section 564(b)(1) of the Act, 21 U.S.C. section 360bbb-3(b)(1), unless the authorization is terminated or revoked.  Performed at [facility], [address] [place], [place] [address]   MRSA PCR Screening     Status: None   Collection Time: 09/16/20  6:59 PM   Specimen: Nasopharyngeal  Result Value Ref Range Status   MRSA by PCR NEGATIVE NEGATIVE Final    Comment:        The GeneXpert MRSA Assay (FDA approved for NASAL specimens only), is one component of a comprehensive MRSA colonization surveillance program. It is not intended to diagnose MRSA infection nor to guide or monitor treatment for MRSA infections. Performed at [facility], [address] [place], [place] [address]      Time coordinating discharge: 45 minutes  SIGNED:  Dohn Stclair Annett Gula, MD  [facility] 09/17/2020, 8:28 AM

## 2020-09-17 NOTE — Progress Notes (Signed)
Inpatient Diabetes Program Recommendations  AACE/ADA: New Consensus Statement on Inpatient Glycemic Control (2015)  Target Ranges:  Prepandial:   less than 140 mg/dL      Peak postprandial:   less than 180 mg/dL (1-2 hours)      Critically ill patients:  140 - 180 mg/dL   Results for Ashley Freeman, Ashley Freeman (MRN [id number]) as of 09/17/2020 07:07  Ref. Range 09/16/2020 23:55 09/17/2020 02:04 09/17/2020 03:58 09/17/2020 06:04  Glucose-Capillary Latest Ref Range: 70 - 99 mg/dL 114 (H) 195 (H) 160 (H) 120 (H)   To ED with DKA  History: Type 1 diabetes, Multiple admits for DKA  Home DM Meds: Dexcom CGM                                                  Omni Pod insulin pump  Current Orders: IV Insulin Drip  Pt Well known to the Inpatient Diabetes team--Multiple ED visits since 2021    MD- Note 5:14 am BMET shows the following: Glucose 119 mg/dl Anion Gap= 8 CO2 level= 21  If pt's family has brought insulin pump supplies to hospital for her, we could allow her to restart her insulin pump.  Have pt restart her pump while she remains on the IV Insulin Drip this AM.  Can stop the IV insulin drip 2 hours after the Home Pump has been restarted.  Will need to place Insulin Pump Orders (found under order sets).   If pt cannot get Insulin Pump supplies delivered by family and you are ready to transition her back to SQ Insulin, then could do the following: --Start Lantus 15 units Daily (make sure pt gets the Lantus at least 2 hours prior to d/c of the IV Insulin Drip) --Start Novolog Sensitive Correction Scale/ SSI (0-9 units) TID AC + HS --Start Novolog 3 units TID for meal coverage    --Will follow patient during hospitalization--  Wyn Quaker RN, MSN, CDE Diabetes Coordinator Inpatient Glycemic Control Team Team Pager: [phone] (8a-5p)

## 2020-09-18 LAB — URINE CULTURE

## 2020-09-19 LAB — I-STAT BETA HCG BLOOD, ED (NOT ORDERABLE): I-stat hCG, quantitative: <5 m[IU]/mL (ref <5)

## 2020-09-29 ENCOUNTER — Telehealth: Payer: Self-pay | Admitting: Internal Medicine

## 2020-09-29 MED ORDER — DEXCOM G6 TRANSMITTER MISC: 2 refills | Status: DC

## 2020-09-29 NOTE — Telephone Encounter (Signed)
Refill as requested 

## 2020-09-29 NOTE — Telephone Encounter (Signed)
New message    1. Which medications need to be refilled? (please list name of each medication and dose if known) Continuous Blood Gluc Transmit (DEXCOM G6 TRANSMITTER) MISC  2. Which pharmacy/location (including street and city if local pharmacy) is medication to be sent to?[facility] ([place] - [address]

## 2020-09-30 ENCOUNTER — Ambulatory Visit: Payer: BLUE CROSS/BLUE SHIELD | Admitting: Internal Medicine

## 2020-10-05 ENCOUNTER — Telehealth: Payer: Self-pay | Admitting: Pharmacy Technician

## 2020-10-05 NOTE — Telephone Encounter (Addendum)
Patient Advocate Encounter   Received notification from [facility] that prior authorization for OMNIPOD DASH PODS is required.   PA submitted on 10/05/2020 Key [id number] Status is PA is APPROVED.     Approval is for 15 pods per 30 days (15 pods per 30 days).  10/06/2020 through 10/05/2021  I called [place] and they are going to process.  Spoke with Junius Argyle  Pharmacy PA phone number is [phone]    [facility] will continue to follow.   Venida Jarvis. Nadara Mustard, CPhT Patient Advocate [facility] Phone: [phone] Fax:  [phone]

## 2020-10-06 ENCOUNTER — Observation Stay (HOSPITAL_COMMUNITY)
Admission: EM | Admit: 2020-10-06 | Discharge: 2020-10-07 | Disposition: A | Discharge status: Home / Self Care | Payer: Medicaid Other | Attending: Internal Medicine | Admitting: Internal Medicine

## 2020-10-06 ENCOUNTER — Emergency Department (HOSPITAL_COMMUNITY): Payer: Medicaid Other

## 2020-10-06 DIAGNOSIS — E1022 Type 1 diabetes mellitus with diabetic chronic kidney disease: Secondary | ICD-10-CM | POA: Diagnosis not present

## 2020-10-06 DIAGNOSIS — Z79899 Other long term (current) drug therapy: Secondary | ICD-10-CM | POA: Insufficient documentation

## 2020-10-06 DIAGNOSIS — E1021 Type 1 diabetes mellitus with diabetic nephropathy: Secondary | ICD-10-CM | POA: Diagnosis not present

## 2020-10-06 DIAGNOSIS — N390 Urinary tract infection, site not specified: Secondary | ICD-10-CM | POA: Diagnosis not present

## 2020-10-06 DIAGNOSIS — N1831 Chronic kidney disease, stage 3a: Secondary | ICD-10-CM | POA: Insufficient documentation

## 2020-10-06 DIAGNOSIS — N179 Acute kidney failure, unspecified: Secondary | ICD-10-CM | POA: Insufficient documentation

## 2020-10-06 DIAGNOSIS — E1110 Type 2 diabetes mellitus with ketoacidosis without coma: Secondary | ICD-10-CM | POA: Diagnosis present

## 2020-10-06 DIAGNOSIS — Z794 Long term (current) use of insulin: Secondary | ICD-10-CM | POA: Diagnosis not present

## 2020-10-06 DIAGNOSIS — E1010 Type 1 diabetes mellitus with ketoacidosis without coma: Secondary | ICD-10-CM | POA: Diagnosis not present

## 2020-10-06 DIAGNOSIS — Z20822 Contact with and (suspected) exposure to covid-19: Secondary | ICD-10-CM | POA: Diagnosis not present

## 2020-10-06 DIAGNOSIS — R112 Nausea with vomiting, unspecified: Secondary | ICD-10-CM | POA: Diagnosis present

## 2020-10-06 DIAGNOSIS — R000 Tachycardia, unspecified: Secondary | ICD-10-CM | POA: Diagnosis not present

## 2020-10-06 LAB — CBG MONITORING, ED
Glucose-Capillary: >600 mg/dL (ref 70–99)
Glucose-Capillary: >600 mg/dL (ref 70–99)
Glucose-Capillary: 518 mg/dL (ref 70–99)

## 2020-10-06 LAB — COMPREHENSIVE METABOLIC PANEL
ALT: 17 U/L (ref 0–44)
AST: 20 U/L (ref 15–41)
Albumin: 3.1 g/dL — ABNORMAL LOW (ref 3.5–5.0)
Alkaline Phosphatase: 63 U/L (ref 38–126)
Anion gap: 16 — ABNORMAL HIGH (ref 5–15)
BUN: 33 mg/dL — ABNORMAL HIGH (ref 6–20)
CO2: 11 mmol/L — ABNORMAL LOW (ref 22–32)
Calcium: 9 mg/dL (ref 8.9–10.3)
Chloride: 101 mmol/L (ref 98–111)
Creatinine, Ser: 2.02 mg/dL — ABNORMAL HIGH (ref 0.44–1.00)
GFR, Estimated: 34 mL/min — ABNORMAL LOW (ref >60)
Glucose, Bld: 803 mg/dL (ref 70–99)
Potassium: 5.3 mmol/L — ABNORMAL HIGH (ref 3.5–5.1)
Sodium: 128 mmol/L — ABNORMAL LOW (ref 135–145)
Total Bilirubin: 1 mg/dL (ref 0.3–1.2)
Total Protein: 6.5 g/dL (ref 6.5–8.1)

## 2020-10-06 LAB — RESP PANEL BY RT-PCR (FLU A&B, COVID) ARPGX2
Influenza A by PCR: NEGATIVE
Influenza B by PCR: NEGATIVE
SARS Coronavirus 2 by RT PCR: NEGATIVE

## 2020-10-06 LAB — URINALYSIS, ROUTINE W REFLEX MICROSCOPIC
Bilirubin Urine: NEGATIVE
Glucose, UA: >=500 mg/dL — AB
Ketones, ur: 20 mg/dL — AB
Nitrite: NEGATIVE
Protein, ur: 100 mg/dL — AB
Specific Gravity, Urine: 1.015 (ref 1.005–1.030)
pH: 5 (ref 5.0–8.0)

## 2020-10-06 LAB — BASIC METABOLIC PANEL
Anion gap: 12 (ref 5–15)
Anion gap: 10 (ref 5–15)
BUN: 36 mg/dL — ABNORMAL HIGH (ref 6–20)
BUN: 30 mg/dL — ABNORMAL HIGH (ref 6–20)
CO2: 20 mmol/L — ABNORMAL LOW (ref 22–32)
CO2: 22 mmol/L (ref 22–32)
Calcium: 9.3 mg/dL (ref 8.9–10.3)
Calcium: 9.1 mg/dL (ref 8.9–10.3)
Chloride: 103 mmol/L (ref 98–111)
Chloride: 105 mmol/L (ref 98–111)
Creatinine, Ser: 1.96 mg/dL — ABNORMAL HIGH (ref 0.44–1.00)
Creatinine, Ser: 1.5 mg/dL — ABNORMAL HIGH (ref 0.44–1.00)
GFR, Estimated: 35 mL/min — ABNORMAL LOW (ref >60)
GFR, Estimated: 48 mL/min — ABNORMAL LOW (ref >60)
Glucose, Bld: 465 mg/dL — ABNORMAL HIGH (ref 70–99)
Glucose, Bld: 229 mg/dL — ABNORMAL HIGH (ref 70–99)
Potassium: 4 mmol/L (ref 3.5–5.1)
Potassium: 4.1 mmol/L (ref 3.5–5.1)
Sodium: 135 mmol/L (ref 135–145)
Sodium: 137 mmol/L (ref 135–145)

## 2020-10-06 LAB — I-STAT VENOUS BLOOD GAS, ED
Acid-base deficit: 14 mmol/L — ABNORMAL HIGH (ref 0.0–2.0)
Bicarbonate: 12.5 mmol/L — ABNORMAL LOW (ref 20.0–28.0)
Calcium, Ion: 1.19 mmol/L (ref 1.15–1.40)
HCT: 31 % — ABNORMAL LOW (ref 36.0–46.0)
Hemoglobin: 10.5 g/dL — ABNORMAL LOW (ref 12.0–15.0)
O2 Saturation: 99 %
Potassium: 5.3 mmol/L — ABNORMAL HIGH (ref 3.5–5.1)
Sodium: 129 mmol/L — ABNORMAL LOW (ref 135–145)
TCO2: 13 mmol/L — ABNORMAL LOW (ref 22–32)
pCO2, Ven: 30.2 mmHg — ABNORMAL LOW (ref 44.0–60.0)
pH, Ven: 7.225 — ABNORMAL LOW (ref 7.250–7.430)
pO2, Ven: 139 mmHg — ABNORMAL HIGH (ref 32.0–45.0)

## 2020-10-06 LAB — GLUCOSE, CAPILLARY
Glucose-Capillary: 343 mg/dL — ABNORMAL HIGH (ref 70–99)
Glucose-Capillary: 383 mg/dL — ABNORMAL HIGH (ref 70–99)
Glucose-Capillary: 245 mg/dL — ABNORMAL HIGH (ref 70–99)
Glucose-Capillary: 197 mg/dL — ABNORMAL HIGH (ref 70–99)
Glucose-Capillary: 218 mg/dL — ABNORMAL HIGH (ref 70–99)
Glucose-Capillary: 216 mg/dL — ABNORMAL HIGH (ref 70–99)
Glucose-Capillary: 188 mg/dL — ABNORMAL HIGH (ref 70–99)
Glucose-Capillary: 220 mg/dL — ABNORMAL HIGH (ref 70–99)

## 2020-10-06 LAB — CBC WITH DIFFERENTIAL/PLATELET
Abs Immature Granulocytes: 0.03 10*3/uL (ref 0.00–0.07)
Basophils Absolute: 0.1 10*3/uL (ref 0.0–0.1)
Basophils Relative: 1 %
Eosinophils Absolute: 0.3 10*3/uL (ref 0.0–0.5)
Eosinophils Relative: 4 %
HCT: 32 % — ABNORMAL LOW (ref 36.0–46.0)
Hemoglobin: 9.9 g/dL — ABNORMAL LOW (ref 12.0–15.0)
Immature Granulocytes: 0 %
Lymphocytes Relative: 24 %
Lymphs Abs: 1.9 10*3/uL (ref 0.7–4.0)
MCH: 27.2 pg (ref 26.0–34.0)
MCHC: 30.9 g/dL (ref 30.0–36.0)
MCV: 87.9 fL (ref 80.0–100.0)
Monocytes Absolute: 0.4 10*3/uL (ref 0.1–1.0)
Monocytes Relative: 5 %
Neutro Abs: 5.2 10*3/uL (ref 1.7–7.7)
Neutrophils Relative %: 66 %
Platelets: 264 10*3/uL (ref 150–400)
RBC: 3.64 MIL/uL — ABNORMAL LOW (ref 3.87–5.11)
RDW: 13.5 % (ref 11.5–15.5)
WBC: 7.8 10*3/uL (ref 4.0–10.5)
nRBC: 0 % (ref 0.0–0.2)

## 2020-10-06 LAB — BETA-HYDROXYBUTYRIC ACID
Beta-Hydroxybutyric Acid: 5.94 mmol/L — ABNORMAL HIGH (ref 0.05–0.27)
Beta-Hydroxybutyric Acid: 1.77 mmol/L — ABNORMAL HIGH (ref 0.05–0.27)

## 2020-10-06 LAB — I-STAT BETA HCG BLOOD, ED (MC, WL, AP ONLY): I-stat hCG, quantitative: <5 m[IU]/mL (ref <5)

## 2020-10-06 MED ORDER — LACTATED RINGERS IV SOLN: INTRAVENOUS | Status: DC

## 2020-10-06 MED ORDER — LACTATED RINGERS IV BOLUS
1000.0000 mL | INTRAVENOUS | Status: AC
  Administered 2020-10-06 (×2): 1000 mL via INTRAVENOUS

## 2020-10-06 MED ORDER — INSULIN REGULAR(HUMAN) IN NACL 100-0.9 UT/100ML-% IV SOLN
INTRAVENOUS | Status: DC
Start: 2020-10-06 — End: 2020-10-06
  Administered 2020-10-06: 6 [IU]/h via INTRAVENOUS
  Filled 2020-10-06: qty 100

## 2020-10-06 MED ORDER — DEXTROSE IN LACTATED RINGERS 5 % IV SOLN: INTRAVENOUS | Status: DC

## 2020-10-06 MED ORDER — DEXTROSE 50 % IV SOLN: 0.0000 mL | INTRAVENOUS | Status: DC | PRN

## 2020-10-06 MED ORDER — INSULIN REGULAR(HUMAN) IN NACL 100-0.9 UT/100ML-% IV SOLN
INTRAVENOUS | Status: DC
  Administered 2020-10-06: 3 [IU]/h via INTRAVENOUS

## 2020-10-06 MED ORDER — INSULIN GLARGINE 100 UNIT/ML ~~LOC~~ SOLN
15.0000 [IU] | SUBCUTANEOUS | Status: DC
  Administered 2020-10-06: 15 [IU] via SUBCUTANEOUS
  Filled 2020-10-06 (×2): qty 0.15

## 2020-10-06 MED ORDER — SODIUM CHLORIDE 0.9 % IV SOLN
12.5000 mg | Freq: Once | INTRAVENOUS | Status: DC
  Filled 2020-10-06: qty 0.5

## 2020-10-06 MED ORDER — SODIUM BICARBONATE 8.4 % IV SOLN
50.0000 meq | Freq: Once | INTRAVENOUS | Status: AC
  Administered 2020-10-06: 50 meq via INTRAVENOUS
  Filled 2020-10-06: qty 50

## 2020-10-06 MED ORDER — INSULIN ASPART 100 UNIT/ML IJ SOLN
3.0000 [IU] | Freq: Three times a day (TID) | INTRAMUSCULAR | Status: DC
  Administered 2020-10-07 (×2): 3 [IU] via SUBCUTANEOUS

## 2020-10-06 MED ORDER — INSULIN ASPART 100 UNIT/ML IJ SOLN: 0.0000 [IU] | Freq: Every day | INTRAMUSCULAR | Status: DC

## 2020-10-06 MED ORDER — LACTATED RINGERS IV BOLUS
1000.0000 mL | Freq: Once | INTRAVENOUS | Status: AC
  Administered 2020-10-06: 1000 mL via INTRAVENOUS

## 2020-10-06 MED ORDER — INSULIN ASPART 100 UNIT/ML IJ SOLN
0.0000 [IU] | Freq: Three times a day (TID) | INTRAMUSCULAR | Status: DC
  Administered 2020-10-07: 2 [IU] via SUBCUTANEOUS
  Administered 2020-10-07: 1 [IU] via SUBCUTANEOUS

## 2020-10-06 MED ORDER — ENOXAPARIN SODIUM 40 MG/0.4ML IJ SOSY
40.0000 mg | PREFILLED_SYRINGE | INTRAMUSCULAR | Status: DC
  Filled 2020-10-06: qty 0.4

## 2020-10-06 MED ORDER — METOCLOPRAMIDE HCL 5 MG/ML IJ SOLN
5.0000 mg | Freq: Once | INTRAMUSCULAR | Status: AC
  Administered 2020-10-06: 5 mg via INTRAVENOUS

## 2020-10-06 MED ORDER — ONDANSETRON HCL 4 MG/2ML IJ SOLN
4.0000 mg | Freq: Once | INTRAMUSCULAR | Status: AC
  Administered 2020-10-06: 4 mg via INTRAVENOUS
  Filled 2020-10-06: qty 2

## 2020-10-06 NOTE — ED Notes (Signed)
Blood sugar maintains at 6units/hr per endotool.

## 2020-10-06 NOTE — ED Notes (Signed)
Pt was able to ambulate to the restroom.  

## 2020-10-06 NOTE — ED Provider Notes (Signed)
[facility] EMERGENCY DEPARTMENT Provider Note   CSN: [id number] Arrival date & time: 10/06/20  0909     History Chief Complaint  Patient presents with   Emesis    Ashley Freeman is a 30 y.o. female.  HPI Patient is a 30 year old female with a history of type 1 diabetes mellitus, sepsis, DKA, who presents to the emergency department due to hyperglycemia.  Patient recently admitted due to DKA.  She states that about 2 days ago she once again ran out of the connector to her insulin pump.  She states that she was reaching out to her insurance company has not got that approved once again.  Due to this, she has only been using NovoLog.  She states she has been taking 10 units at mealtime.  She has not measured her blood sugar at home for the past 2 days.  She reports fatigue as well as nausea/vomiting.  Denies any chest pain or abdominal pain.  States that her symptoms today are similar to prior DKA exacerbations.  She notes that she has a young child at home and has recently had a more difficult time focusing on her medications as well as her medication regimen.    Past Medical History:  Diagnosis Date   Abscess, gluteal, right 08/24/2013   AKI (acute kidney injury) ([place]) 07/26/2014   Anemia 02/19/2012   Bartholin's gland abscess 09/19/2013   BV (bacterial vaginosis) 11/24/2015   Diabetes mellitus type I ([place]) 2001   Diagnosed at age 14 ; Type I   Diarrhea 05/30/2016   DKA (diabetic ketoacidoses) 08/19/2013   Also in 2018   Gonorrhea 08/2011   Treated in 09/2011   History of trichomoniasis 05/31/2016   Hyperlipidemia 03/28/2016   Sepsis ([place]) 09/19/2013    Patient Active Problem List   Diagnosis Date Noted   AKI (acute kidney injury) ([place]) 09/17/2020   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   DKA (diabetic ketoacidosis) ([place]) 09/16/2020   Type 1 diabetes mellitus with stage 3a chronic kidney disease ([place]) 07/18/2020   History of  pre-eclampsia 10/05/2019   Transaminitis 10/05/2019   Elevated serum creatinine 10/05/2019   Bilateral leg edema 73/22/0254   Systolic ejection murmur 27/09/2374   Shortness of breath 08/03/2019   Type 1 diabetes mellitus with hyperglycemia ([place]) 07/24/2019   Diabetic retinopathy ([place]) 07/02/2019   Proteinuria due to type 1 diabetes mellitus ([place]) 05/21/2019   Diabetic neuropathy ([place]) 01/16/2016   Diabetes mellitus type 1, uncontrolled, with complications ([place]) 28/31/5176    Past Surgical History:  Procedure Laterality Date   CESAREAN SECTION N/A 10/05/2019   Procedure: CESAREAN SECTION;  Surgeon: Aletha Halim, MD;  Location: [facility];  Service: Obstetrics;  Laterality: N/A;   INCISION AND DRAINAGE ABSCESS Left 09/28/2019   Procedure: INCISION AND DRAINAGE VULVAR ABCESS;  Surgeon: [name], MD;  Location: [place];  Service: Gynecology;  Laterality: Left;   INCISION AND DRAINAGE PERIRECTAL ABSCESS Right 08/18/2013   Procedure: IRRIGATION AND DEBRIDEMENT GLUTEAL ABSCESS;  Surgeon: Ralene Ok, MD;  Location: [place];  Service: General;  Laterality: Right;   INCISION AND DRAINAGE PERIRECTAL ABSCESS Right 09/19/2013   Procedure: IRRIGATION AND DEBRIDEMENT RIGHT GLUTEAL AND LABIAL ABSCESSES;  Surgeon: Ralene Ok, MD;  Location: [place];  Service: General;  Laterality: Right;   INCISION AND DRAINAGE PERIRECTAL ABSCESS Right 09/24/2013   Procedure: IRRIGATION AND DEBRIDEMENT PERIRECTAL ABSCESS;  Surgeon: Gwenyth Ober, MD;  Location: [place];  Service: General;  Laterality: Right;     OB History     Gravida  2   Para  1   Term      Preterm  1   AB  1   Living  1      SAB  1   IAB      Ectopic      Multiple  0   Live Births  1           Family History  Problem Relation Age of Onset   Asthma Mother    Carpal tunnel syndrome Mother    Gout Father    Diabetes Paternal Grandmother    Anesthesia problems Neg Hx     Social History   Tobacco Use    Smoking status: Never   Smokeless tobacco: Never  Vaping Use   Vaping Use: Never used  Substance Use Topics   Alcohol use: Not Currently   Drug use: No    Home Medications Prior to Admission medications   Medication Sig Start Date End Date Taking? Authorizing Provider  albuterol (VENTOLIN HFA) 108 (90 Base) MCG/ACT inhaler Inhale 2 puffs into the lungs every 4 (four) hours as needed for wheezing or shortness of breath. 08/09/20  Yes [provider]  cetirizine-pseudoephedrine (ZYRTEC-D) 5-120 MG tablet Take 1 tablet by mouth every 12 (twelve) hours as needed for allergies. 08/09/20  Yes [provider]  insulin aspart (NOVOLOG) 100 UNIT/ML injection Use via insulin pump daily. Max daily 30 units Patient taking differently: Inject 30 Units into the skin See admin instructions. Use via insulin pump 06/20/20  Yes Philemon Kingdom, MD  ondansetron (ZOFRAN ODT) 4 MG disintegrating tablet 85m ODT q4 hours prn nausea/vomit Patient taking differently: Take 4 mg by mouth every 4 (four) hours as needed for nausea or vomiting. 09/12/20  Yes ZMilton Ferguson MD  blood glucose meter kit and supplies KIT Dispense based on patient and insurance preference. Use up to four times daily as directed. (FOR ICD-9 250.00, 250.01). 05/25/17   HIsla Pence MD  Blood Pressure Monitoring (BLOOD PRESSURE KIT) DEVI 1 Device by Does not apply route as needed. 05/06/19   Anyanwu, USallyanne Havers MD  Continuous Blood Gluc Sensor (DEXCOM G6 SENSOR) MISC USE AS DIRECTED, CHANGE SENSOR EVERY 10 DAYS 08/16/20   Shamleffer, IMelanie Crazier MD  Continuous Blood Gluc Transmit (DEXCOM G6 TRANSMITTER) MISC Use to check blood sugar daily 09/29/20   Shamleffer, IMelanie Crazier MD  Insulin Disposable Pump (OMNIPOD DASH 5 PACK PODS) MISC Change POD every 3 days. Please dispense 6 boxes 07/15/20   Shamleffer, IMelanie Crazier MD  promethazine (PHENERGAN) 25 MG tablet Take 1 tablet (25 mg total) by mouth every 8 (eight) hours as  needed for nausea or vomiting (if zofran fails). Patient not taking: Reported on 10/06/2020 08/25/20   NVarney Biles MD  Ferrous Sulfate (IRON) 325 (65 Fe) MG TABS Take 1 tablet (325 mg total) by mouth every other day. Patient not taking: Reported on 07/25/2020 06/17/19 07/25/20  FChauncey Mann MD  hydrochlorothiazide (HYDRODIURIL) 25 MG tablet Take 1 tablet (25 mg total) by mouth daily. Patient not taking: No sig reported 10/08/19 07/25/20  Constant, Peggy, MD    Allergies    Cephalexin, Penicillins, Benadryl [diphenhydramine], and Doxycycline  Review of Systems   Review of Systems  All other systems reviewed and are negative. Ten systems reviewed and are negative for acute change, except as noted in the HPI.   Physical Exam Updated  Vital Signs BP 124/76   Pulse (!) 122   Temp 98.3 F (36.8 C)   Resp 19   Ht '5\' 3"'  (1.6 m)   Wt 57.2 kg   LMP 09/06/2020   SpO2 99%   BMI 22.32 kg/m   Physical Exam Vitals and nursing note reviewed.  Constitutional:      General: She is not in acute distress.    Appearance: Normal appearance. She is normal weight. She is not ill-appearing, toxic-appearing or diaphoretic.  HENT:     Head: Normocephalic and atraumatic.     Right Ear: External ear normal.     Left Ear: External ear normal.     Nose: Nose normal.     Mouth/Throat:     Mouth: Mucous membranes are dry.     Pharynx: Oropharynx is clear. No oropharyngeal exudate or posterior oropharyngeal erythema.  Eyes:     Extraocular Movements: Extraocular movements intact.  Cardiovascular:     Rate and Rhythm: Regular rhythm. Tachycardia present.     Pulses: Normal pulses.     Heart sounds: Normal heart sounds. No murmur heard.   No friction rub. No gallop.  Pulmonary:     Effort: Pulmonary effort is normal. No respiratory distress.     Breath sounds: Normal breath sounds. No stridor. No wheezing, rhonchi or rales.  Abdominal:     General: Abdomen is flat.     Palpations: Abdomen is soft.      Tenderness: There is no abdominal tenderness.     Comments: Abdomen is flat, soft, and nontender.  Musculoskeletal:        General: Normal range of motion.     Cervical back: Normal range of motion and neck supple. No tenderness.  Skin:    General: Skin is warm and dry.  Neurological:     General: No focal deficit present.     Mental Status: She is alert and oriented to person, place, and time.  Psychiatric:        Mood and Affect: Mood normal.        Behavior: Behavior normal.    ED Results / Procedures / Treatments   Labs (all labs ordered are listed, but only abnormal results are displayed) Labs Reviewed  COMPREHENSIVE METABOLIC PANEL - Abnormal; Notable for the following components:      Result Value   Sodium 128 (*)    Potassium 5.3 (*)    CO2 11 (*)    Glucose, Bld 803 (*)    BUN 33 (*)    Creatinine, Ser 2.02 (*)    Albumin 3.1 (*)    GFR, Estimated 34 (*)    Anion gap 16 (*)    All other components within normal limits  CBC WITH DIFFERENTIAL/PLATELET - Abnormal; Notable for the following components:   RBC 3.64 (*)    Hemoglobin 9.9 (*)    HCT 32.0 (*)    All other components within normal limits  URINALYSIS, ROUTINE W REFLEX MICROSCOPIC - Abnormal; Notable for the following components:   APPearance CLOUDY (*)    Glucose, UA >=500 (*)    Hgb urine dipstick SMALL (*)    Ketones, ur 20 (*)    Protein, ur 100 (*)    Leukocytes,Ua LARGE (*)    Bacteria, UA FEW (*)    All other components within normal limits  CBG MONITORING, ED - Abnormal; Notable for the following components:   Glucose-Capillary >600 (*)    All other components within normal limits  I-STAT VENOUS BLOOD GAS, ED - Abnormal; Notable for the following components:   pH, Ven 7.225 (*)    pCO2, Ven 30.2 (*)    pO2, Ven 139.0 (*)    Bicarbonate 12.5 (*)    TCO2 13 (*)    Acid-base deficit 14.0 (*)    Sodium 129 (*)    Potassium 5.3 (*)    HCT 31.0 (*)    Hemoglobin 10.5 (*)    All other  components within normal limits  RESP PANEL BY RT-PCR (FLU A&B, COVID) [id number]  BETA-HYDROXYBUTYRIC ACID  CBG MONITORING, ED  I-STAT BETA HCG BLOOD, ED ([facility], [facility], [facility] ONLY)   EKG None  Radiology DG Chest Portable 1 View  Result Date: 10/06/2020 CLINICAL DATA:  Diabetic ketoacidosis. EXAM: PORTABLE CHEST 1 VIEW COMPARISON:  10/27/2017 FINDINGS: Normal heart size. No pleural effusion or edema. No airspace densities identified. Review of the visualized osseous structures is unremarkable. IMPRESSION: No active disease. Electronically Signed   By: Kerby Moors M.D.   On: 10/06/2020 10:30    Procedures .Critical Care  Date/Time: 10/06/2020 12:10 PM Performed by: Rayna Sexton, PA-C Authorized by: Rayna Sexton, PA-C   Critical care provider statement:    Critical care time (minutes):  45   Critical care was necessary to treat or prevent imminent or life-threatening deterioration of the following conditions:  Metabolic crisis and endocrine crisis   Critical care was time spent personally by me on the following activities:  Discussions with consultants, evaluation of patient's response to treatment, examination of patient, ordering and performing treatments and interventions, ordering and review of laboratory studies, ordering and review of radiographic studies, pulse oximetry, re-evaluation of patient's condition, obtaining history from patient or surrogate and review of old charts   Medications Ordered in ED Medications  insulin regular, human (MYXREDLIN) 100 units/ 100 mL infusion (6 Units/hr Intravenous New Bag/Given 10/06/20 1136)  lactated ringers infusion ( Intravenous New Bag/Given 10/06/20 1145)  dextrose 5 % in lactated ringers infusion (has no administration in time range)  dextrose 50 % solution 0-50 mL (has no administration in time range)  sodium bicarbonate injection 50 mEq (has no administration in time range)  lactated ringers bolus 1,000 mL (0 mLs Intravenous Stopped 10/06/20  1146)  ondansetron (ZOFRAN) injection 4 mg (4 mg Intravenous Given 10/06/20 1020)  metoCLOPramide (REGLAN) injection 5 mg (5 mg Intravenous Given 10/06/20 1141)   ED Course  I have reviewed the triage vital signs and the nursing notes.  Pertinent labs & imaging results that were available during my care of the patient were reviewed by me and considered in my medical decision making (see chart for details).  Clinical Course as of 10/06/20 1216  Thu Oct 06, 2020  1138 Ketones, ur(!): 20 [LJ]  1209 Glucose(!!): 803 [LJ]  1210 Anion gap(!): 16 [LJ]  1213 CO2(!): 11 [LJ]  1213 BUN(!): 33 [LJ]  1213 Creatinine(!): 2.02 [LJ]  1213 GFR, Estimated(!): 34 [LJ]  1213 Anion gap(!): 16 [LJ]  1213 pH, Ven(!): 7.225 [LJ]    Clinical Course User Index [LJ] Rayna Sexton, PA-C   MDM Rules/Calculators/A&P                          Pt is a 30 y.o. female with a history of type 1 diabetes mellitus who presents to the emergency department and what appears to be DKA.  Labs: CBC with a hemoglobin of 9.9 and hematocrit of 32. CMP with a sodium  of 128, potassium of 5.3, CO2 of 11, glucose of 803, BUN of 33, creatinine of 2.02, GFR 34, anion gap of 16. VBG with a pH of 7.225. Beta hydroxybutyric acid pending. I-STAT beta-hCG less than 5. Respiratory panel pending.  Imaging: Chest x-ray is negative.  I, Rayna Sexton, PA-C, personally reviewed and evaluated these images and lab results as part of my medical decision-making.  Patient appears to once again be in DKA.  She was started on Endo tool and IV fluids.  She was given 1 amp of bicarb as well as Zofran/Reglan for nausea/vomiting.  Patient will require admission for further work-up.  Will discuss with medicine team.  Note: Portions of this report may have been transcribed using voice recognition software. Every effort was made to ensure accuracy; however, inadvertent computerized transcription errors may be present.   Final Clinical  Impression(s) / ED Diagnoses Final diagnoses:  Diabetic ketoacidosis without coma associated with type 1 diabetes mellitus [facility])   Rx / DC Orders ED Discharge Orders     None        Rayna Sexton, PA-C 10/06/20 1217    Pattricia Boss, MD 10/06/20 1222    Pattricia Boss, MD 10/06/20 1242

## 2020-10-06 NOTE — ED Notes (Signed)
D5LR due time moved to 1700. Pt blood sugar remains greater than 600 at this time. D5LR to start once sugar hits below 200. Will continue to monitor.

## 2020-10-06 NOTE — H&P (Signed)
History and Physical    Ashley Freeman [place] ERD:[id number] DOB: May 27, 1990 DOA: 10/06/2020  PCP: [name], PA-C Consultants:  [facility] - endocrinology Patient coming from:  Home - lives with her mother and daughter; NOK: Mother, Mikki Santee, [phone]  Chief Complaint: DKA  HPI: Ashley Freeman is a 30 y.o. female with medical history significant of T1DM who was previously hospitalized 5/20-5/21 for DKA, presenting with the same.  She reports that she was unable to get her insulin pods for her pump after hospitalization because of an insurance glitch so she hasn't had any since Tuesday.  She did try some SSI without success.  She developed abdominal pain, somnolence, fatigue, and nausea without significant vomiting.  No concern for infection.   ED Course: Recent hospitalization for DKA, returns with the same.  N/v, poor interaction.  Glucose 800, pH 7.225, pCO2 10.  Started on Endotool.  Review of Systems: As per HPI; otherwise review of systems reviewed and negative.   Ambulatory Status:  Ambulates without assistance  COVID Vaccine Status:  None  Past Medical History:  Diagnosis Date   Abscess, gluteal, right 08/24/2013   AKI (acute kidney injury) ([place]) 07/26/2014   Anemia 02/19/2012   Bartholin's gland abscess 09/19/2013   BV (bacterial vaginosis) 11/24/2015   Diabetes mellitus type I ([place]) 2001   Diagnosed at age 62 ; Type I   Diarrhea 05/30/2016   DKA (diabetic ketoacidoses) 08/19/2013   Also in 2018   Gonorrhea 08/2011   Treated in 09/2011   History of trichomoniasis 05/31/2016   Hyperlipidemia 03/28/2016   Sepsis ([place]) 09/19/2013    Past Surgical History:  Procedure Laterality Date   CESAREAN SECTION N/A 10/05/2019   Procedure: CESAREAN SECTION;  Surgeon: Aletha Halim, MD;  Location: [facility];  Service: Obstetrics;  Laterality: N/A;   INCISION AND DRAINAGE ABSCESS Left 09/28/2019   Procedure: INCISION AND DRAINAGE VULVAR ABCESS;   Surgeon: [name], MD;  Location: [place];  Service: Gynecology;  Laterality: Left;   INCISION AND DRAINAGE PERIRECTAL ABSCESS Right 08/18/2013   Procedure: IRRIGATION AND DEBRIDEMENT GLUTEAL ABSCESS;  Surgeon: Ralene Ok, MD;  Location: [place];  Service: General;  Laterality: Right;   INCISION AND DRAINAGE PERIRECTAL ABSCESS Right 09/19/2013   Procedure: IRRIGATION AND DEBRIDEMENT RIGHT GLUTEAL AND LABIAL ABSCESSES;  Surgeon: Ralene Ok, MD;  Location: [place];  Service: General;  Laterality: Right;   INCISION AND DRAINAGE PERIRECTAL ABSCESS Right 09/24/2013   Procedure: IRRIGATION AND DEBRIDEMENT PERIRECTAL ABSCESS;  Surgeon: Gwenyth Ober, MD;  Location: [place];  Service: General;  Laterality: Right;    Social History   Socioeconomic History   Marital status: Single    Spouse name: Not on file   Number of children: 0   Years of education: 11th grade   Highest education level: Not on file  Occupational History   Occupation: unemployed    Comment: has never worked  Tobacco Use   Smoking status: Never   Smokeless tobacco: Never  Vaping Use   Vaping Use: Never used  Substance and Sexual Activity   Alcohol use: Not Currently   Drug use: No   Sexual activity: Yes    Birth control/protection: None  Other Topics Concern   Not on file  Social History Narrative   Patient lives in [place] mother lives in [place].  Unemployed.  Previously worked for a IT consultant.  Completed 11 grade working on [employer]. Patient 3 brothers    Social Determinants of Health  Financial Resource Strain: Not on file  Food Insecurity: Not on file  Transportation Needs: Not on file  Physical Activity: Not on file  Stress: Not on file  Social Connections: Not on file  Intimate Partner Violence: Not on file    Allergies  Allergen Reactions   Cephalexin Anaphylaxis   Penicillins Hives and Rash    Has patient had a PCN reaction causing immediate rash, facial/tongue/throat swelling, SOB  or lightheadedness with hypotension: Yes Has patient had a PCN reaction causing severe rash involving mucus membranes or skin necrosis: No Has patient had a PCN reaction that required hospitalization: Yes Has patient had a PCN reaction occurring within the last 10 years: No If all of the above answers are "NO", then may proceed with Cephalosporin use.   Benadryl [Diphenhydramine] Itching   Doxycycline Itching    Family History  Problem Relation Age of Onset   Asthma Mother    Carpal tunnel syndrome Mother    Gout Father    Diabetes Paternal Grandmother    Anesthesia problems Neg Hx     Prior to Admission medications   Medication Sig Start Date End Date Taking? Authorizing Provider  albuterol (VENTOLIN HFA) 108 (90 Base) MCG/ACT inhaler Inhale 2 puffs into the lungs every 4 (four) hours as needed for wheezing or shortness of breath. 08/09/20  Yes [provider]  cetirizine-pseudoephedrine (ZYRTEC-D) 5-120 MG tablet Take 1 tablet by mouth every 12 (twelve) hours as needed for allergies. 08/09/20  Yes [provider]  insulin aspart (NOVOLOG) 100 UNIT/ML injection Use via insulin pump daily. Max daily 30 units Patient taking differently: Inject 30 Units into the skin See admin instructions. Use via insulin pump 06/20/20  Yes Philemon Kingdom, MD  ondansetron (ZOFRAN ODT) 4 MG disintegrating tablet 33m ODT q4 hours prn nausea/vomit Patient taking differently: Take 4 mg by mouth every 4 (four) hours as needed for nausea or vomiting. 09/12/20  Yes ZMilton Ferguson MD  blood glucose meter kit and supplies KIT Dispense based on patient and insurance preference. Use up to four times daily as directed. (FOR ICD-9 250.00, 250.01). 05/25/17   HIsla Pence MD  Blood Pressure Monitoring (BLOOD PRESSURE KIT) DEVI 1 Device by Does not apply route as needed. 05/06/19   Anyanwu, USallyanne Havers MD  Continuous Blood Gluc Sensor (DEXCOM G6 SENSOR) MISC USE AS DIRECTED, CHANGE SENSOR EVERY 10 DAYS  08/16/20   Shamleffer, IMelanie Crazier MD  Continuous Blood Gluc Transmit (DEXCOM G6 TRANSMITTER) MISC Use to check blood sugar daily 09/29/20   Shamleffer, IMelanie Crazier MD  Insulin Disposable Pump (OMNIPOD DASH 5 PACK PODS) MISC Change POD every 3 days. Please dispense 6 boxes 07/15/20   Shamleffer, IMelanie Crazier MD  promethazine (PHENERGAN) 25 MG tablet Take 1 tablet (25 mg total) by mouth every 8 (eight) hours as needed for nausea or vomiting (if zofran fails). Patient not taking: Reported on 10/06/2020 08/25/20   NVarney Biles MD  Ferrous Sulfate (IRON) 325 (65 Fe) MG TABS Take 1 tablet (325 mg total) by mouth every other day. Patient not taking: Reported on 07/25/2020 06/17/19 07/25/20  FChauncey Mann MD  hydrochlorothiazide (HYDRODIURIL) 25 MG tablet Take 1 tablet (25 mg total) by mouth daily. Patient not taking: No sig reported 10/08/19 07/25/20  Constant, PVickii Chafe MD    Physical Exam: Vitals:   10/06/20 1300 10/06/20 1334 10/06/20 1400 10/06/20 1451  BP: (!) 153/93 (!) 145/74 (!) 144/86 137/85  Pulse: (!) 122 (!) 121 (!) 118 (!) 109  Resp: '17 12 18 19  ' Temp:  98 F (36.7 C) 98.1 F (36.7 C) 98.8 F (37.1 C)  TempSrc:  Oral Oral Oral  SpO2: 98% 100% 98% 98%  Weight:    57.2 kg  Height:    '5\' 3"'  (1.6 m)     General:  Appears calm and somnolent and is in NAD Eyes:   EOMI, normal lids, iris ENT:  grossly normal hearing, lips & tongue, moderately dry mm Neck:  no LAD, masses or thyromegaly Cardiovascular:  RR with tachycardia, no m/r/g. No LE edema.  Respiratory:   CTA bilaterally with no wheezes/rales/rhonchi.  Normal respiratory effort. Abdomen:  soft, NT, ND Skin:  no rash or induration seen on limited exam Musculoskeletal:  grossly normal tone BUE/BLE, good ROM, no bony abnormality Psychiatric:  grossly normal mood and affect, speech fluent and appropriate, AOx3 Neurologic:  CN 2-12 grossly intact, moves all extremities in coordinated fashion    Radiological Exams on  Admission: Independently reviewed - see discussion in A/P where applicable  DG Chest Portable 1 View  Result Date: 10/06/2020 CLINICAL DATA:  Diabetic ketoacidosis. EXAM: PORTABLE CHEST 1 VIEW COMPARISON:  10/27/2017 FINDINGS: Normal heart size. No pleural effusion or edema. No airspace densities identified. Review of the visualized osseous structures is unremarkable. IMPRESSION: No active disease. Electronically Signed   By: Kerby Moors M.D.   On: 10/06/2020 10:30    EKG: Independently reviewed.  Sinus tachycardia with rate 114; no evidence of acute ischemia   Labs on Admission: I have personally reviewed the available labs and imaging studies at the time of the admission.  Pertinent labs:   Na++ 128 -> 135 K+ 5.3 -> 4.0 CO2 11 -> 20 Glucose 803, >600, 518, 383, 343, 245, 197, 218 BUN 33/Creatinine 2.02/GFR 34 -> 36/1.96/35 Anion gap 16 -> 12 WBC 7.8 Hgb 9.9 Beta-hydrox 5.94 -> 1.77 UA: >500 glucose, small Hgb, 20 ketones, large LE, 100 protein, few bacteria HCG <5 ABG: 7.225/30.2/139/13/12.5   Assessment/Plan Principal Problem:   DKA (diabetic ketoacidosis) (HCC) Active Problems:   Type 1 diabetes mellitus with stage 3a chronic kidney disease (HCC)   AKI (acute kidney injury) ([place])   DKA -Patient with T1DM poor baseline control (recent hospitalization for the same, A1c on 3/18 was 9.4) presenting with abdominal pain, nausea, somnolence in the setting of not having insulin for her pump -No indication of illness as source -Moderate DKA on admission based on pH 7.225, HCO3 11, anion gap 16, patient alert and possibly a bit drowsy -Will admit to SDU with DKA protocol -K+ slightly increased at time of presentation  -IVF at 150 cc/hr, LR until glucose <250 and then decrease rate to 125 and change to D5LR -She has since normalized her CO2 and closed her gap; she appears ready to transition and so orders have been placed accordingly -His primary issues appear to be need for  education - diabetes coordinator and dietician consulted; and medications/PCP - TOC consult requested -Needs ongoing outpatient endocrinology f/u  AKI on stage 3a CKD -Baseline creatinine is likely 1.3-1.4 with GFR 50s -She is at significant risk for renal complications of DM including need for HD -She may benefit from initiation of SGLT2 inhibitor therapy, but not while she is continuing to regularly develop DKA     Note: This patient has been tested and is negative for the novel coronavirus COVID-19.     DVT prophylaxis:  Lovenox  Code Status:  Full - confirmed with patient Family Communication: None  present Disposition Plan:  The patient is from: home  Anticipated d/c is to: home without [facility] services   Anticipated d/c date will depend on clinical response to treatment, but possibly as early as tomorrow if he has excellent response to treatment  Patient is currently: acutely ill Consults called: Nutrition; Diabetes coordinator; [facility] team Admission status:  It is my clinical opinion that referral for OBSERVATION is reasonable and necessary in this patient based on the above information provided. The aforementioned taken together are felt to place the patient at high risk for further clinical deterioration. However it is anticipated that the patient may be medically stable for discharge from the hospital within 24 to 48 hours.   Karmen Bongo MD [facility]   How to contact the [facility] Attending or Consulting provider [place] or covering provider during after hours [place], for this patient?  Check the care team in [facility] and look for a) attending/consulting [facility] provider listed and b) the [facility] team listed Log into [email or web address] and use [place] universal password to access. If you do not have the password, please contact the hospital operator. Locate the [facility] provider you are looking for under [facility] and page to a number that you can be directly reached. If you still  have difficulty reaching the provider, please page the [facility] (Director on Call) for the Hospitalists listed on amion for assistance.   10/06/2020, 7:07 PM

## 2020-10-06 NOTE — ED Notes (Signed)
Report given to 5W. SWOT is coming to transport pt from [facility] to [place] room 32.

## 2020-10-06 NOTE — Progress Notes (Signed)
Inpatient Diabetes Program Recommendations  AACE/ADA: New Consensus Statement on Inpatient Glycemic Control (2015)  Target Ranges:  Prepandial:   less than 140 mg/dL      Peak postprandial:   less than 180 mg/dL (1-2 hours)      Critically ill patients:  140 - 180 mg/dL   Lab Results  Component Value Date   GLUCAP >600 (HH) 10/06/2020   HGBA1C 9.4 (A) 07/15/2020  Results for Ashley Freeman, Ashley Freeman (MRN [id number]) as of 10/06/2020 13:49  Ref. Range 10/06/2020 09:54  Sodium Latest Ref Range: 135 - 145 mmol/L 128 (L)  Potassium Latest Ref Range: 3.5 - 5.1 mmol/L 5.3 (H)  Chloride Latest Ref Range: 98 - 111 mmol/L 101  CO2 Latest Ref Range: 22 - 32 mmol/L 11 (L)  Glucose Latest Ref Range: 70 - 99 mg/dL 803 (HH)  BUN Latest Ref Range: 6 - 20 mg/dL 33 (H)  Creatinine Latest Ref Range: 0.44 - 1.00 mg/dL 2.02 (H)  Calcium Latest Ref Range: 8.9 - 10.3 mg/dL 9.0    Review of Glycemic Control  Diabetes history: DM1 Outpatient Diabetes medications: Omnipod Insulin Pump Current orders for Inpatient glycemic control: IV insulin  Inpatient Diabetes Program Recommendations:   Noted patient reported unable to continue insulin pump @ home due to ran out of insulin pump connector since Monday and taking "subcutaneous insulin". Will speak with patient and clarify if has access to basal if this situation happens in the future.  Patient has had multiple admissions with DKA and well known to diabetes coordinators.   When patient ready for transition from IV insulin:  If pt's family has brought insulin pump supplies to hospital for her, we could allow her to restart her insulin pump.  Have pt restart her pump while she remains on the IV Insulin Drip this AM.  Can stop the IV insulin drip 2 hours after the Home Pump has been restarted.  Will need to place Insulin Pump Orders (found under order sets).   If pt cannot get Insulin Pump supplies delivered by family and you are ready to transition her  back to SQ Insulin, then could do the following: --Start Lantus 15 units Daily (make sure pt gets the Lantus at least 2 hours prior to d/c of the IV Insulin Drip) --Start Novolog Sensitive Correction Scale/ SSI (0-9 units) TID AC + HS --Start Novolog 3 units TID for meal coverage  Thank you, Bethena Roys E. Zena Vitelli, RN, MSN, CDE  Diabetes Coordinator Inpatient Glycemic Control Team Team Pager [phone] (8am-5pm) 10/06/2020 1:49 PM

## 2020-10-06 NOTE — ED Triage Notes (Signed)
Hx of DM type 1. Ran out of insulin pump refill since Monday. Here for nausea, vomiting. Alert and oriented x 4.

## 2020-10-06 NOTE — Plan of Care (Signed)
  Problem: Education: Goal: Knowledge of General Education information will improve Description: Including pain rating scale, medication(s)/side effects and non-pharmacologic comfort measures Outcome: Progressing   Problem: Health Behavior/Discharge Planning: Goal: Ability to manage health-related needs will improve Outcome: Progressing   Problem: Clinical Measurements: Goal: Will remain free from infection Outcome: Progressing   

## 2020-10-07 ENCOUNTER — Telehealth: Payer: Self-pay | Admitting: Internal Medicine

## 2020-10-07 ENCOUNTER — Other Ambulatory Visit (HOSPITAL_COMMUNITY): Payer: Self-pay

## 2020-10-07 DIAGNOSIS — Z20822 Contact with and (suspected) exposure to covid-19: Secondary | ICD-10-CM | POA: Diagnosis not present

## 2020-10-07 DIAGNOSIS — Z794 Long term (current) use of insulin: Secondary | ICD-10-CM | POA: Diagnosis not present

## 2020-10-07 DIAGNOSIS — Z79899 Other long term (current) drug therapy: Secondary | ICD-10-CM | POA: Diagnosis not present

## 2020-10-07 DIAGNOSIS — E1022 Type 1 diabetes mellitus with diabetic chronic kidney disease: Secondary | ICD-10-CM | POA: Diagnosis not present

## 2020-10-07 DIAGNOSIS — N390 Urinary tract infection, site not specified: Secondary | ICD-10-CM | POA: Diagnosis not present

## 2020-10-07 DIAGNOSIS — E1010 Type 1 diabetes mellitus with ketoacidosis without coma: Secondary | ICD-10-CM | POA: Diagnosis not present

## 2020-10-07 DIAGNOSIS — N179 Acute kidney failure, unspecified: Secondary | ICD-10-CM | POA: Diagnosis not present

## 2020-10-07 DIAGNOSIS — N1831 Chronic kidney disease, stage 3a: Secondary | ICD-10-CM | POA: Diagnosis not present

## 2020-10-07 LAB — GASTROINTESTINAL PANEL BY PCR, STOOL (REPLACES STOOL CULTURE)

## 2020-10-07 LAB — BASIC METABOLIC PANEL
Anion gap: 11 (ref 5–15)
BUN: 24 mg/dL — ABNORMAL HIGH (ref 6–20)
CO2: 20 mmol/L — ABNORMAL LOW (ref 22–32)
Calcium: 8.7 mg/dL — ABNORMAL LOW (ref 8.9–10.3)
Chloride: 105 mmol/L (ref 98–111)
Creatinine, Ser: 1.42 mg/dL — ABNORMAL HIGH (ref 0.44–1.00)
GFR, Estimated: 52 mL/min — ABNORMAL LOW (ref >60)
Glucose, Bld: 221 mg/dL — ABNORMAL HIGH (ref 70–99)
Potassium: 4 mmol/L (ref 3.5–5.1)
Sodium: 136 mmol/L (ref 135–145)

## 2020-10-07 LAB — GLUCOSE, CAPILLARY
Glucose-Capillary: 189 mg/dL — ABNORMAL HIGH (ref 70–99)
Glucose-Capillary: 138 mg/dL — ABNORMAL HIGH (ref 70–99)

## 2020-10-07 LAB — BETA-HYDROXYBUTYRIC ACID: Beta-Hydroxybutyric Acid: 0.3 mmol/L — ABNORMAL HIGH (ref 0.05–0.27)

## 2020-10-07 MED ORDER — FLUCONAZOLE 200 MG PO TABS
200.0000 mg | ORAL_TABLET | Freq: Every day | ORAL | 0 refills | Status: DC
  Filled 2020-10-07: qty 7, 7d supply, fill #0

## 2020-10-07 MED ORDER — INSULIN ASPART 100 UNIT/ML FLEXPEN
PEN_INJECTOR | SUBCUTANEOUS | 0 refills | Status: DC
  Filled 2020-10-07: qty 9, 28d supply, fill #0

## 2020-10-07 MED ORDER — FLUCONAZOLE 100 MG PO TABS
200.0000 mg | ORAL_TABLET | Freq: Every day | ORAL | Status: DC
  Administered 2020-10-07: 200 mg via ORAL
  Filled 2020-10-07: qty 2

## 2020-10-07 MED ORDER — INSULIN GLARGINE 100 UNIT/ML SOLOSTAR PEN: 15.0000 [IU] | PEN_INJECTOR | Freq: Every day | SUBCUTANEOUS | 0 refills | Status: DC

## 2020-10-07 MED ORDER — INSULIN PEN NEEDLE 32G X 4 MM MISC
1.0000 | 0 refills | Status: DC | PRN
  Filled 2020-10-07: qty 200, 30d supply, fill #0

## 2020-10-07 NOTE — Telephone Encounter (Signed)
Pt calling in voiced that she would like to let Dr. Kelton Pillar know that she is in the hospital.

## 2020-10-07 NOTE — Progress Notes (Addendum)
1530: patient left without medications, patients IV was removed and discharge paperwork explained and given. Patient was told to wait in the room and pharmacy would be bringing her medications up to her. Patient was seen several minutes later exiting the unit without medications. When approached by NT and asked about exiting with a wheelchair she stated " I can walk."

## 2020-10-07 NOTE — Plan of Care (Signed)
  RD consulted for nutrition education regarding diabetes.   Lab Results  Component Value Date   HGBA1C 9.4 (A) 07/15/2020   Spoke with pt at bedside, who reports that she was first diagnosed with DM at age 30. She admittedly reports not taking her DM very seriously until about 2 years ago, when she was pregnant with her daughter. She shares that she was transitioned to an insulin pump around this time and this had made management of DM a lot easier for her.   Per pt, she has had a poorly functioning transmitter and being unable to get Omni pod sensors. She shares that her insurance is "messed up", but has enough insulin at home. She takes insulin from her cousin when she is running low.   Pt reports she usually consumes 3 meals per day (Breakfast: sausage biscuit and gravy OR leftover; Lunch: salad OR meat, starch, and vegetable; Dinner: meat, starch, and vegetable). Pt usually consumes water and Sprite Zero.   Pt with no further questions related to nutrition or DM at this time.   RD provided "Carbohydrate Counting for People with Diabetes" handout from the Academy of Nutrition and Dietetics. Discussed different food groups and their effects on blood sugar, emphasizing carbohydrate-containing foods. Provided list of carbohydrates and recommended serving sizes of common foods.  Discussed importance of controlled and consistent carbohydrate intake throughout the day. Provided examples of ways to balance meals/snacks and encouraged intake of high-fiber, whole grain complex carbohydrates. Teach back method used.  Expect fair to good compliance.  Current diet order is carb modified, patient is consuming approximately 100% of meals at this time. Labs and medications reviewed. No further nutrition interventions warranted at this time. RD contact information provided. If additional nutrition issues arise, please re-consult RD.  Loistine Chance, RD, LDN, [place] Registered Dietitian II Certified Diabetes  Care and Education Specialist Please refer to [facility] for RD and/or RD on-call/weekend/after hours pager

## 2020-10-07 NOTE — Progress Notes (Signed)
Left without filled prescription

## 2020-10-07 NOTE — Plan of Care (Signed)

## 2020-10-07 NOTE — Discharge Summary (Signed)
PATIENT DETAILS Name: Ashley Freeman Age: 30 y.o. Sex: female Date of Birth: 1991/03/28 MRN: [id number]. Admitting Physician: Karmen Bongo, MD WCB:[id number], Orson Ape, PA-C  Admit Date: 10/06/2020 Discharge date: 10/07/2020  Recommendations for Outpatient Follow-up:  Follow up with PCP in 1-2 weeks Please obtain CMP/CBC in one week Please ensure follow-up with primary endocrinologist Being discharged on Lantus insulin-as patient has had issues with her insulin pump.  Admitted From:  Home  Disposition: [place]:  No  Equipment/Devices: None  Discharge Condition: Stable  CODE STATUS: FULL CODE  Diet recommendation:  Diet Order             Diet - low sodium heart healthy           Diet Carb Modified Fluid consistency: Thin; Room service appropriate? Yes  Diet effective now                    Brief Summary: See H&P, Labs, Consult and Test reports for all details in brief, patient is a 30 year old female with history of DM-1-on insulin pump-presented with DKA-as she ran out of her insulin supplies due to a glitch with her insurance company.  Brief Hospital Course: DKA: Due to lack of insulin-she ran out of insulin.  Admitted and started on IV insulin/IV fluids-DKA has resolved-she has been transitioned to SQ insulin with adequate glycemic control.  DM-1: Previously on insulin pump-has issues with her insurance-and ran out of insulin-she claims she has Lantus at home-and is okay being on Lantus till her insurance issues are sorted out.  We will plan on discharge with Lantus 15 units, and sliding scale NovoLog insulin.  Patient is very familiar with insulin administration-his family with issues regarding hypoglycemia and needed interventions.  I have asked her to follow-up with her primary endocrinologist within 1 week.  Diarrhea: Resolved as of this afternoon-she had some loose stools earlier this morning.  GI pathogen panel is negative-C.  difficile panel is pending.  However since diarrhea is completely resolved-doubt further treatment will be needed.  UTI: Claims she has some dysuria-UA positive for yeast-we will discharge on Diflucan.  AKI on CKD stage IIIa: AKI likely hemodynamically mediated-improved with IVF-IV insulin and close to baseline.  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 ([place])   AKI (acute kidney injury) [facility])   Discharge Instructions:  Activity:  As tolerated   Discharge Instructions     Call MD for:  persistant nausea and vomiting   Complete by: As directed    Diet - low sodium heart healthy   Complete by: As directed    Discharge instructions   Complete by: As directed    Follow with Primary MD  [name], PA-C in 1-2 weeks  Follow with your primary endocrinologist in 1 week  Please check your blood sugars multiple times a day-keep a record of these readings and take it with you to your next appointment with your primary care practitioner/primary endocrinologist.  Once you have worked out issues with her insurance company-you will probably need to be switched back to insulin pump-before you do that-please touch base with your primary endocrinologist.  Please get a complete blood count and chemistry panel checked by your Primary MD at your next visit, and again as instructed by your Primary MD.  Get Medicines reviewed and adjusted: Please take all  your medications with you for your next visit with your Primary MD  Laboratory/radiological data: Please request your Primary MD to go over all hospital tests and procedure/radiological results at the follow up, please ask your Primary MD to get all Hospital records sent to his/her office.  In some cases, they will be blood work, cultures and biopsy results pending at the time of your discharge. Please  request that your primary care M.D. follows up on these results.  Also Note the following: If you experience worsening of your admission symptoms, develop shortness of breath, life threatening emergency, suicidal or homicidal thoughts you must seek medical attention immediately by calling 911 or calling your MD immediately  if symptoms less severe.  You must read complete instructions/literature along with all the possible adverse reactions/side effects for all the Medicines you take and that have been prescribed to you. Take any new Medicines after you have completely understood and accpet all the possible adverse reactions/side effects.   Do not drive when taking Pain medications or sleeping medications (Benzodaizepines)  Do not take more than prescribed Pain, Sleep and Anxiety Medications. It is not advisable to combine anxiety,sleep and pain medications without talking with your primary care practitioner  Special Instructions: If you have smoked or chewed Tobacco  in the last 2 yrs please stop smoking, stop any regular Alcohol  and or any Recreational drug use.  Wear Seat belts while driving.  Please note: You were cared for by a hospitalist during your hospital stay. Once you are discharged, your primary care physician will handle any further medical issues. Please note that NO REFILLS for any discharge medications will be authorized once you are discharged, as it is imperative that you return to your primary care physician (or establish a relationship with a primary care physician if you do not have one) for your post hospital discharge needs so that they can reassess your need for medications and monitor your lab values.   Increase activity slowly   Complete by: As directed       Allergies as of 10/07/2020       Reactions   Cephalexin Anaphylaxis   Penicillins Hives, Rash   Has patient had a PCN reaction causing immediate rash, facial/tongue/throat swelling, SOB or lightheadedness  with hypotension: Yes Has patient had a PCN reaction causing severe rash involving mucus membranes or skin necrosis: No Has patient had a PCN reaction that required hospitalization: Yes Has patient had a PCN reaction occurring within the last 10 years: No If all of the above answers are "NO", then may proceed with Cephalosporin use.   Benadryl [diphenhydramine] Itching   Doxycycline Itching        Medication List     STOP taking these medications    insulin aspart 100 UNIT/ML injection Commonly known as: NovoLOG Replaced by: insulin aspart 100 UNIT/ML FlexPen   Omnipod DASH Pods (Gen 4) Misc   ondansetron 4 MG disintegrating tablet Commonly known as: Zofran ODT       TAKE these medications    albuterol 108 (90 Base) MCG/ACT inhaler Commonly known as: VENTOLIN HFA Inhale 2 puffs into the lungs every 4 (four) hours as needed for wheezing or shortness of breath.   blood glucose meter kit and supplies Kit Dispense based on patient and insurance preference. Use up to four times daily as directed. (FOR ICD-9 250.00, 250.01).   Blood Pressure Kit Devi 1 Device by Does not apply route as needed.   cetirizine-pseudoephedrine 5-120 MG  tablet Commonly known as: ZYRTEC-D Take 1 tablet by mouth every 12 (twelve) hours as needed for allergies.   Dexcom G6 Sensor Misc USE AS DIRECTED, CHANGE SENSOR EVERY 10 DAYS   Dexcom G6 Transmitter Misc Use to check blood sugar daily   fluconazole 200 MG tablet Commonly known as: DIFLUCAN Take 1 tablet (200 mg total) by mouth daily.   insulin aspart 100 UNIT/ML FlexPen Commonly known as: NOVOLOG 0-9 Units, Subcutaneous, 3 times daily with meals, CBG < 70: Implement Hypoglycemia measures CBG 70 - 120: 0 units CBG 121 - 150: 1 unit CBG 151 - 200: 2 units CBG 201 - 250: 3 units CBG 251 - 300: 5 units CBG 301 - 350: 7 units CBG 351 - 400: 9 units CBG > 400: call MD Replaces: insulin aspart 100 UNIT/ML injection   insulin glargine 100  UNIT/ML Solostar Pen Commonly known as: LANTUS Inject 15 Units into the skin daily.   Insulin Pen Needle 32G X 4 MM Misc 1 each by Does not apply route as needed.        Follow-up Information     [name], PA-C. Schedule an appointment as soon as possible for a visit in 1 week(s).   Specialty: Physician Assistant Why: Hospital follow up Contact information: [place] [address] [phone]         Shamleffer, Melanie Crazier, MD. Schedule an appointment as soon as possible for a visit in 1 week(s).   Specialties: Endocrinology, Radiology Why: Hospital follow up Contact information: [place] [address] [phone]                Allergies  Allergen Reactions   Cephalexin Anaphylaxis   Penicillins Hives and Rash    Has patient had a PCN reaction causing immediate rash, facial/tongue/throat swelling, SOB or lightheadedness with hypotension: Yes Has patient had a PCN reaction causing severe rash involving mucus membranes or skin necrosis: No Has patient had a PCN reaction that required hospitalization: Yes Has patient had a PCN reaction occurring within the last 10 years: No If all of the above answers are "NO", then may proceed with Cephalosporin use.   Benadryl [Diphenhydramine] Itching   Doxycycline Itching    Consultations:  None   Other Procedures/Studies: DG Chest Portable 1 View  Result Date: 10/06/2020 CLINICAL DATA:  Diabetic ketoacidosis. EXAM: PORTABLE CHEST 1 VIEW COMPARISON:  10/27/2017 FINDINGS: Normal heart size. No pleural effusion or edema. No airspace densities identified. Review of the visualized osseous structures is unremarkable. IMPRESSION: No active disease. Electronically Signed   By: Kerby Moors M.D.   On: 10/06/2020 10:30     TODAY-DAY OF DISCHARGE:  Subjective:   Eadie [place] today has no headache,no chest abdominal pain,no new weakness tingling or  numbness, feels much better wants to go home today.   Objective:   Blood pressure (!) 143/97, pulse (!) 105, temperature 98.9 F (37.2 C), temperature source Oral, resp. rate 20, height _0  (1.6 m), weight 57.2 kg, SpO2 100 %, unknown if currently breastfeeding.  Intake/Output Summary (Last 24 hours) at 10/07/2020 1359 Last data filed at 10/07/2020 0543 Gross per 24 hour  Intake 3849.81 ml  Output --  Net 3849.81 ml   Filed Weights   10/06/20 0941 10/06/20 1451  Weight: 57.2 kg 57.2 kg    Exam: Awake Alert, Oriented *3, No new F.N deficits, Normal affect [place].AT,PERRAL Supple Neck,No JVD, No cervical lymphadenopathy appriciated.  Symmetrical  Chest wall movement, Good air movement bilaterally, CTAB RRR,No Gallops,Rubs or new Murmurs, No Parasternal Heave +ve B.Sounds, Abd Soft, Non tender, No organomegaly appriciated, No rebound -guarding or rigidity. No Cyanosis, Clubbing or edema, No new Rash or bruise   PERTINENT RADIOLOGIC STUDIES: DG Chest Portable 1 View  Result Date: 10/06/2020 CLINICAL DATA:  Diabetic ketoacidosis. EXAM: PORTABLE CHEST 1 VIEW COMPARISON:  10/27/2017 FINDINGS: Normal heart size. No pleural effusion or edema. No airspace densities identified. Review of the visualized osseous structures is unremarkable. IMPRESSION: No active disease. Electronically Signed   By: Kerby Moors M.D.   On: 10/06/2020 10:30     PERTINENT LAB RESULTS: CBC: Recent Labs    10/06/20 0954 10/06/20 1020  WBC 7.8  --   HGB 9.9* 10.5*  HCT 32.0* 31.0*  PLT 264  --    CMET CMP     Component Value Date/Time   NA 136 10/07/2020 0621   NA 140 09/08/2019 0945   NA 137 11/12/2013 0423   K 4.0 10/07/2020 0621   K 4.2 11/12/2013 0423   CL 105 10/07/2020 0621   CL 107 11/12/2013 0423   CO2 20 (L) 10/07/2020 0621   CO2 23 11/12/2013 0423   GLUCOSE 221 (H) 10/07/2020 0621   GLUCOSE 339 (H) 11/12/2013 0423   BUN 24 (H) 10/07/2020 0621   BUN 9 09/08/2019 0945   BUN 7 11/12/2013  0423   CREATININE 1.42 (H) 10/07/2020 0621   CREATININE 0.59 (L) 11/12/2013 0423   CREATININE 0.51 08/24/2013 0957   CALCIUM 8.7 (L) 10/07/2020 0621   CALCIUM 8.2 (L) 11/12/2013 0423   PROT 6.5 10/06/2020 0954   PROT 5.9 (L) 09/08/2019 0945   PROT 8.0 11/10/2013 2043   ALBUMIN 3.1 (L) 10/06/2020 0954   ALBUMIN 2.9 (L) 09/08/2019 0945   ALBUMIN 3.9 11/10/2013 2043   AST 20 10/06/2020 0954   AST 12 (L) 11/10/2013 2043   ALT 17 10/06/2020 0954   ALT 10 (L) 11/10/2013 2043   ALKPHOS 63 10/06/2020 0954   ALKPHOS 77 11/10/2013 2043   BILITOT 1.0 10/06/2020 0954   BILITOT <0.2 09/08/2019 0945   BILITOT 0.5 11/10/2013 2043   GFRNONAA 52 (L) 10/07/2020 0621   GFRNONAA >60 11/12/2013 0423   GFRNONAA >89 08/24/2013 0957   GFRAA 44 (L) 01/30/2020 1637   GFRAA >60 11/12/2013 0423   GFRAA >89 08/24/2013 0957    GFR Estimated Creatinine Clearance: 48.8 mL/min (A) (by C-G formula based on SCr of 1.42 mg/dL (H)). No results for input(s): LIPASE, AMYLASE in the last 72 hours. No results for input(s): CKTOTAL, CKMB, CKMBINDEX, TROPONINI in the last 72 hours. Invalid input(s): POCBNP No results for input(s): DDIMER in the last 72 hours. No results for input(s): HGBA1C in the last 72 hours. No results for input(s): CHOL, HDL, LDLCALC, TRIG, CHOLHDL, LDLDIRECT in the last 72 hours. No results for input(s): TSH, T4TOTAL, T3FREE, THYROIDAB in the last 72 hours.  Invalid input(s): FREET3 No results for input(s): VITAMINB12, FOLATE, FERRITIN, TIBC, IRON, RETICCTPCT in the last 72 hours. Coags: No results for input(s): INR in the last 72 hours.  Invalid input(s): PT Microbiology: Recent Results (from the past 240 hour(s))  Resp Panel by RT-PCR (Flu A&B, Covid) Nasopharyngeal Swab     Status: None   Collection Time: 10/06/20 11:10 AM   Specimen: Nasopharyngeal Swab; Nasopharyngeal(NP) swabs in vial transport medium  Result Value Ref Range Status   SARS Coronavirus 2 by RT PCR NEGATIVE NEGATIVE  Final  Comment: (NOTE) SARS-CoV-2 target nucleic acids are NOT DETECTED.  The SARS-CoV-2 RNA is generally detectable in upper respiratory specimens during the acute phase of infection. The lowest concentration of SARS-CoV-2 viral copies this assay can detect is 138 copies/mL. A negative result does not preclude SARS-Cov-2 infection and should not be used as the sole basis for treatment or other patient management decisions. A negative result may occur with  improper specimen collection/handling, submission of specimen other than nasopharyngeal swab, presence of viral mutation(s) within the areas targeted by this assay, and inadequate number of viral copies(<138 copies/mL). A negative result must be combined with clinical observations, patient history, and epidemiological information. The expected result is Negative.  Fact Sheet for Patients:  EntrepreneurPulse.com.au  Fact Sheet for Healthcare Providers:  IncredibleEmployment.be  This test is no t yet approved or cleared by the [place] FDA and  has been authorized for detection and/or diagnosis of SARS-CoV-2 by FDA under an Emergency Use Authorization (EUA). This EUA will remain  in effect (meaning this test can be used) for the duration of the COVID-19 declaration under Section 564(b)(1) of the Act, 21 U.S.C.section 360bbb-3(b)(1), unless the authorization is terminated  or revoked sooner.       Influenza A by PCR NEGATIVE NEGATIVE Final   Influenza B by PCR NEGATIVE NEGATIVE Final    Comment: (NOTE) The Xpert Xpress SARS-CoV-2/FLU/RSV plus assay is intended as an aid in the diagnosis of influenza from Nasopharyngeal swab specimens and should not be used as a sole basis for treatment. Nasal washings and aspirates are unacceptable for Xpert Xpress SARS-CoV-2/FLU/RSV testing.  Fact Sheet for Patients: EntrepreneurPulse.com.au  Fact Sheet for Healthcare  Providers: IncredibleEmployment.be  This test is not yet approved or cleared by the [place] FDA and has been authorized for detection and/or diagnosis of SARS-CoV-2 by FDA under an Emergency Use Authorization (EUA). This EUA will remain in effect (meaning this test can be used) for the duration of the COVID-19 declaration under Section 564(b)(1) of the Act, 21 U.S.C. section 360bbb-3(b)(1), unless the authorization is terminated or revoked.  Performed at [facility], [place] [address] [place], [place] [address]   Gastrointestinal Panel by PCR , Stool     Status: None   Collection Time: 10/06/20  9:32 PM   Specimen: STOOL  Result Value Ref Range Status   Campylobacter species NOT DETECTED NOT DETECTED Final   Plesimonas shigelloides NOT DETECTED NOT DETECTED Final   Salmonella species NOT DETECTED NOT DETECTED Final   Yersinia enterocolitica NOT DETECTED NOT DETECTED Final   Vibrio species NOT DETECTED NOT DETECTED Final   Vibrio cholerae NOT DETECTED NOT DETECTED Final   Enteroaggregative E coli (EAEC) NOT DETECTED NOT DETECTED Final   Enteropathogenic E coli (EPEC) NOT DETECTED NOT DETECTED Final   Enterotoxigenic E coli (ETEC) NOT DETECTED NOT DETECTED Final   Shiga like toxin producing E coli (STEC) NOT DETECTED NOT DETECTED Final   Shigella/Enteroinvasive E coli (EIEC) NOT DETECTED NOT DETECTED Final   Cryptosporidium NOT DETECTED NOT DETECTED Final   Cyclospora cayetanensis NOT DETECTED NOT DETECTED Final   Entamoeba histolytica NOT DETECTED NOT DETECTED Final   Giardia lamblia NOT DETECTED NOT DETECTED Final   Adenovirus F40/41 NOT DETECTED NOT DETECTED Final   Astrovirus NOT DETECTED NOT DETECTED Final   Norovirus GI/GII NOT DETECTED NOT DETECTED Final   Rotavirus A NOT DETECTED NOT DETECTED Final   Sapovirus (I, II, IV, and V) NOT DETECTED NOT DETECTED Final  Comment: Performed at [facility], [place] [place], [place]  [address]    FURTHER DISCHARGE INSTRUCTIONS:  Get Medicines reviewed and adjusted: Please take all your medications with you for your next visit with your Primary MD  Laboratory/radiological data: Please request your Primary MD to go over all hospital tests and procedure/radiological results at the follow up, please ask your Primary MD to get all Hospital records sent to his/her office.  In some cases, they will be blood work, cultures and biopsy results pending at the time of your discharge. Please request that your primary care M.D. goes through all the records of your hospital data and follows up on these results.  Also Note the following: If you experience worsening of your admission symptoms, develop shortness of breath, life threatening emergency, suicidal or homicidal thoughts you must seek medical attention immediately by calling 911 or calling your MD immediately  if symptoms less severe.  You must read complete instructions/literature along with all the possible adverse reactions/side effects for all the Medicines you take and that have been prescribed to you. Take any new Medicines after you have completely understood and accpet all the possible adverse reactions/side effects.   Do not drive when taking Pain medications or sleeping medications (Benzodaizepines)  Do not take more than prescribed Pain, Sleep and Anxiety Medications. It is not advisable to combine anxiety,sleep and pain medications without talking with your primary care practitioner  Special Instructions: If you have smoked or chewed Tobacco  in the last 2 yrs please stop smoking, stop any regular Alcohol  and or any Recreational drug use.  Wear Seat belts while driving.  Please note: You were cared for by a hospitalist during your hospital stay. Once you are discharged, your primary care physician will handle any further medical issues. Please note that NO REFILLS for any discharge medications will be authorized once  you are discharged, as it is imperative that you return to your primary care physician (or establish a relationship with a primary care physician if you do not have one) for your post hospital discharge needs so that they can reassess your need for medications and monitor your lab values.  Total Time spent coordinating discharge including counseling, education and face to face time equals 35 minutes.  SignedOren Binet 10/07/2020 1:59 PM

## 2020-10-08 LAB — C DIFFICILE QUICK SCREEN W PCR REFLEX
C Diff antigen: NEGATIVE
C Diff interpretation: NOT DETECTED
C Diff toxin: NEGATIVE

## 2020-10-21 ENCOUNTER — Ambulatory Visit: Payer: BLUE CROSS/BLUE SHIELD | Admitting: Internal Medicine

## 2020-10-27 ENCOUNTER — Telehealth: Payer: Self-pay | Admitting: Pharmacy Technician

## 2020-10-27 NOTE — Telephone Encounter (Signed)
Patient Advocate Encounter   Received notification from [employer] that prior authorization for [place] is required.   PA submitted on 10/27/2020 Key [id number] SENSOR AND [place] TRANSMITTER Status is pending    [facility] will continue to follow.   Venida Jarvis. Nadara Mustard, CPhT Patient Advocate [facility] Phone: [phone] Fax:  [phone]

## 2020-11-02 ENCOUNTER — Telehealth: Payer: Self-pay | Admitting: Pharmacy Technician

## 2020-11-02 NOTE — Telephone Encounter (Signed)
Pt called to check the status of her Dexcom, she states she called the pharmacy and they just said they are waiting on something from the Dr so she just wanted to check in. I advised pt there was a PA that was sent in for the Dexcom.

## 2020-11-02 NOTE — Telephone Encounter (Signed)
Patient Advocate Encounter   Returned patient call and left a HIPPA compliant voicemail.  I spoke with [employer] insurance and they need for patient to call and tell them other insurance has been termed/ended.  This must happen before they will approved the Dexcom G6 Sensor and Transmitter.  She must call [employer] at [phone] and speak with member services.     Clinic will continue to follow.   Venida Jarvis. Nadara Mustard, CPhT Patient Advocate [facility] Phone: [phone] Fax:  [phone]

## 2020-11-04 NOTE — Telephone Encounter (Addendum)
[employer] insurance will approve the [facility] products once patient calls and tells them her past insurance has been termed/ended.      She must call [employer] at [phone] and speak with member services.  I called [facility] and let them know as well in case patient calls them.

## 2020-11-24 ENCOUNTER — Telehealth: Payer: Self-pay | Admitting: Internal Medicine

## 2020-11-24 NOTE — Telephone Encounter (Signed)
Pt voiced has been having seizures  due to low blood sugar,had to call EMS as well as one time going to the hospital. Pt also voiced that along with kidneys hurting patient was wondering if a referral can be sent in for her to go see a kidney doctor. Pt's upcoming app is 12/16/2020

## 2020-11-25 ENCOUNTER — Other Ambulatory Visit: Payer: Self-pay | Admitting: Internal Medicine

## 2020-11-25 ENCOUNTER — Telehealth: Payer: Self-pay | Admitting: Internal Medicine

## 2020-11-25 MED ORDER — INSULIN ASPART 100 UNIT/ML FLEXPEN: PEN_INJECTOR | SUBCUTANEOUS | 0 refills | Status: DC

## 2020-11-25 NOTE — Telephone Encounter (Signed)
Refill sent.

## 2020-11-25 NOTE — Telephone Encounter (Signed)
RX REFILL FOR -   Novolog (patient is out)  [place] ([place]), [place] - [address] Phone:  [phone]  Fax:  [phone]

## 2020-11-25 NOTE — Telephone Encounter (Signed)
Attempted to call pt on her mobile phone at least 6 times throughout lunch, each time had a busy tone. Left a message on home # to call us and sent MyChart message to call us ASAP for an URGENT APPT.

## 2020-11-25 NOTE — Telephone Encounter (Signed)
LMTCB to scheduled appt for today at 4pm

## 2020-12-06 ENCOUNTER — Ambulatory Visit: Payer: Medicaid Other

## 2020-12-07 ENCOUNTER — Observation Stay (HOSPITAL_COMMUNITY)
Admission: EM | Admit: 2020-12-07 | Discharge: 2020-12-09 | Disposition: A | Discharge status: Home / Self Care | Payer: Medicaid Other | Attending: Emergency Medicine | Admitting: Emergency Medicine

## 2020-12-07 ENCOUNTER — Emergency Department (HOSPITAL_COMMUNITY): Payer: Medicaid Other

## 2020-12-07 ENCOUNTER — Other Ambulatory Visit: Payer: Self-pay

## 2020-12-07 ENCOUNTER — Encounter (HOSPITAL_COMMUNITY): Payer: Self-pay | Admitting: Emergency Medicine

## 2020-12-07 DIAGNOSIS — Z794 Long term (current) use of insulin: Secondary | ICD-10-CM | POA: Insufficient documentation

## 2020-12-07 DIAGNOSIS — Z20822 Contact with and (suspected) exposure to covid-19: Secondary | ICD-10-CM | POA: Insufficient documentation

## 2020-12-07 DIAGNOSIS — N184 Chronic kidney disease, stage 4 (severe): Secondary | ICD-10-CM | POA: Diagnosis present

## 2020-12-07 DIAGNOSIS — Z79899 Other long term (current) drug therapy: Secondary | ICD-10-CM | POA: Insufficient documentation

## 2020-12-07 DIAGNOSIS — E1065 Type 1 diabetes mellitus with hyperglycemia: Principal | ICD-10-CM | POA: Insufficient documentation

## 2020-12-07 DIAGNOSIS — R079 Chest pain, unspecified: Secondary | ICD-10-CM

## 2020-12-07 DIAGNOSIS — E1022 Type 1 diabetes mellitus with diabetic chronic kidney disease: Secondary | ICD-10-CM | POA: Diagnosis not present

## 2020-12-07 DIAGNOSIS — E1010 Type 1 diabetes mellitus with ketoacidosis without coma: Secondary | ICD-10-CM | POA: Diagnosis present

## 2020-12-07 DIAGNOSIS — N1831 Chronic kidney disease, stage 3a: Secondary | ICD-10-CM | POA: Insufficient documentation

## 2020-12-07 DIAGNOSIS — R0789 Other chest pain: Secondary | ICD-10-CM | POA: Diagnosis present

## 2020-12-07 LAB — CBC WITH DIFFERENTIAL/PLATELET
Abs Immature Granulocytes: 0.1 10*3/uL — ABNORMAL HIGH (ref 0.00–0.07)
Basophils Absolute: 0 10*3/uL (ref 0.0–0.1)
Basophils Relative: 0 %
Eosinophils Absolute: 0 10*3/uL (ref 0.0–0.5)
Eosinophils Relative: 0 %
HCT: 35.9 % — ABNORMAL LOW (ref 36.0–46.0)
Hemoglobin: 11.8 g/dL — ABNORMAL LOW (ref 12.0–15.0)
Immature Granulocytes: 1 %
Lymphocytes Relative: 14 %
Lymphs Abs: 1.9 10*3/uL (ref 0.7–4.0)
MCH: 26.6 pg (ref 26.0–34.0)
MCHC: 32.9 g/dL (ref 30.0–36.0)
MCV: 80.9 fL (ref 80.0–100.0)
Monocytes Absolute: 0.9 10*3/uL (ref 0.1–1.0)
Monocytes Relative: 7 %
Neutro Abs: 10.3 10*3/uL — ABNORMAL HIGH (ref 1.7–7.7)
Neutrophils Relative %: 78 %
Platelets: 281 10*3/uL (ref 150–400)
RBC: 4.44 MIL/uL (ref 3.87–5.11)
RDW: 13.2 % (ref 11.5–15.5)
WBC: 13.2 10*3/uL — ABNORMAL HIGH (ref 4.0–10.5)
nRBC: 0 % (ref 0.0–0.2)

## 2020-12-07 LAB — BLOOD GAS, VENOUS
Acid-base deficit: 17.2 mmol/L — ABNORMAL HIGH (ref 0.0–2.0)
Bicarbonate: 9.3 mmol/L — ABNORMAL LOW (ref 20.0–28.0)
O2 Saturation: 82.9 %
Patient temperature: 98.6
pCO2, Ven: 23.6 mmHg — ABNORMAL LOW (ref 44.0–60.0)
pH, Ven: 7.219 — ABNORMAL LOW (ref 7.250–7.430)
pO2, Ven: 53.7 mmHg — ABNORMAL HIGH (ref 32.0–45.0)

## 2020-12-07 LAB — I-STAT BETA HCG BLOOD, ED (MC, WL, AP ONLY): I-stat hCG, quantitative: <5 m[IU]/mL (ref <5)

## 2020-12-07 LAB — CBG MONITORING, ED: Glucose-Capillary: >600 mg/dL (ref 70–99)

## 2020-12-07 MED ORDER — ONDANSETRON HCL 4 MG/2ML IJ SOLN
4.0000 mg | Freq: Once | INTRAMUSCULAR | Status: AC
  Administered 2020-12-07: 4 mg via INTRAVENOUS
  Filled 2020-12-07: qty 2

## 2020-12-07 MED ORDER — LACTATED RINGERS IV BOLUS
20.0000 mL/kg | Freq: Once | INTRAVENOUS | Status: AC
  Administered 2020-12-07: 1144 mL via INTRAVENOUS

## 2020-12-07 NOTE — ED Provider Notes (Signed)
[facility] DEPT Provider Note   CSN: [id number] Arrival date & time: 12/07/20  2137     History Chief Complaint  Patient presents with   Hyperglycemia    Ashley Freeman is a 30 y.o. female with a history of diabetes mellitus type 1, STIs, hyperlipidemia who presents emergency department by EMS with a chief complaint of chest pain.  The patient reports that she awoke this morning with "thumping" central chest pain and palpitations.  She states that she administered an unknown dose of insulin this morning and went back to bed.  When she awoke, she states that her chest pain continued and was worsening.  It is nonradiating.  No known aggravating or alleviating factors.  She reports multiple episodes of nonbloody, nonbilious vomiting and reports that she is aching throughout her entire body.  No fever, chills, cough, dysuria, rash.  She states that she has had similar pain with episodes of DKA when her sugar has been high.  She has an insulin pump and had no concerns that it was malfunctioning.  However, due to worsening symptoms, she came to the emergency department for further evaluation.  EMS reports patient CBG was 588 with EMS.  The history is provided by medical records and the patient. No language interpreter was used.      Past Medical History:  Diagnosis Date   Abscess, gluteal, right 08/24/2013   AKI (acute kidney injury) (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/17/2020   DKA (diabetic ketoacidosis) ([place]) 09/16/2020   Type 1 diabetes mellitus with stage 3a chronic kidney disease ([place]) 07/18/2020   History of pre-eclampsia 10/05/2019   Transaminitis 10/05/2019   Elevated serum creatinine 10/05/2019   Bilateral leg edema 94/49/6759   Systolic ejection murmur 16/38/4665   Shortness of breath 08/03/2019   Type 1 diabetes mellitus with hyperglycemia ([place]) 07/24/2019   Diabetic retinopathy ([place]) 07/02/2019   Proteinuria due to type 1 diabetes mellitus ([place]) 05/21/2019   Diabetic neuropathy ([place]) 01/16/2016   Diabetes mellitus type 1, uncontrolled, with complications ([place]) 99/35/7017    Past Surgical History:  Procedure Laterality Date   CESAREAN SECTION N/A 10/05/2019   Procedure: CESAREAN SECTION;  Surgeon: Aletha Halim, MD;  Location: [facility];  Service: Obstetrics;  Laterality: N/A;   INCISION AND DRAINAGE ABSCESS Left 09/28/2019   Procedure: INCISION AND DRAINAGE VULVAR ABCESS;  Surgeon: [name], MD;  Location: [facility];  Service: Gynecology;  Laterality: Left;   INCISION AND DRAINAGE PERIRECTAL ABSCESS Right 08/18/2013   Procedure: IRRIGATION AND DEBRIDEMENT GLUTEAL ABSCESS;  Surgeon: Ralene Ok, MD;  Location: [facility];  Service: General;  Laterality: Right;   INCISION AND DRAINAGE PERIRECTAL ABSCESS Right 09/19/2013   Procedure: IRRIGATION AND DEBRIDEMENT RIGHT GLUTEAL AND LABIAL ABSCESSES;  Surgeon: Ralene Ok, MD;  Location: [place];  Service: General;  Laterality: Right;   INCISION  AND DRAINAGE PERIRECTAL ABSCESS Right 09/24/2013   Procedure: IRRIGATION AND DEBRIDEMENT PERIRECTAL ABSCESS;  Surgeon: Gwenyth Ober, MD;  Location: [facility];  Service: General;  Laterality: Right;     OB History     Gravida  2   Para  1   Term      Preterm  1   AB  1   Living  1      SAB  1   IAB      Ectopic      Multiple  0   Live Births  1           Family  History  Problem Relation Age of Onset   Asthma Mother    Carpal tunnel syndrome Mother    Gout Father    Diabetes Paternal Grandmother    Anesthesia problems Neg Hx     Social History   Tobacco Use   Smoking status: Never   Smokeless tobacco: Never  Vaping Use   Vaping Use: Never used  Substance Use Topics   Alcohol use: Not Currently   Drug use: No    Home Medications Prior to Admission medications   Medication Sig Start Date End Date Taking? Authorizing Provider  albuterol (VENTOLIN HFA) 108 (90 Base) MCG/ACT inhaler Inhale 2 puffs into the lungs every 4 (four) hours as needed for wheezing or shortness of breath. 08/09/20   [provider]  blood glucose meter kit and supplies KIT Dispense based on patient and insurance preference. Use up to four times daily as directed. (FOR ICD-9 250.00, 250.01). 05/25/17   Isla Pence, MD  Blood Pressure Monitoring (BLOOD PRESSURE KIT) DEVI 1 Device by Does not apply route as needed. 05/06/19   Anyanwu, Sallyanne Havers, MD  cetirizine-pseudoephedrine (ZYRTEC-D) 5-120 MG tablet Take 1 tablet by mouth every 12 (twelve) hours as needed for allergies. 08/09/20   [provider]  Continuous Blood Gluc Sensor (DEXCOM G6 SENSOR) MISC USE AS DIRECTED, CHANGE SENSOR EVERY 10 DAYS 08/16/20   Shamleffer, Melanie Crazier, MD  Continuous Blood Gluc Transmit (DEXCOM G6 TRANSMITTER) MISC Use to check blood sugar daily 09/29/20   Shamleffer, Melanie Crazier, MD  fluconazole (DIFLUCAN) 200 MG tablet Take 1 tablet (200 mg total) by mouth daily. 10/07/20   Ghimire, Henreitta Leber, MD  insulin aspart (NOVOLOG) 100 UNIT/ML FlexPen 0-9 Units, Subcutaneous, 3 times daily with meals, CBG < 70: Implement Hypoglycemia measures CBG 70 - 120: 0 units CBG 121 - 150: 1 unit CBG 151 - 200: 2 units CBG 201 - 250: 3 units CBG 251 - 300: 5 units CBG 301 - 350: 7 units CBG 351 - 400: 9 units CBG > 400: call MD 11/25/20   Shamleffer, Melanie Crazier, MD  insulin glargine (LANTUS)  100 UNIT/ML Solostar Pen Inject 15 Units into the skin daily. 10/07/20   Ghimire, Henreitta Leber, MD  Insulin Pen Needle 32G X 4 MM MISC Use as directed 10/07/20   Ghimire, Henreitta Leber, MD  Ferrous Sulfate (IRON) 325 (65 Fe) MG TABS Take 1 tablet (325 mg total) by mouth every other day. Patient not taking: Reported on 07/25/2020 06/17/19 07/25/20  Chauncey Mann, MD  hydrochlorothiazide (HYDRODIURIL) 25 MG tablet Take 1 tablet (25 mg total) by mouth daily. Patient not taking: No sig reported 10/08/19 07/25/20  Constant, Peggy, MD  promethazine (PHENERGAN) 25 MG tablet Take 1 tablet (25 mg total) by mouth every 8 (eight) hours as needed for nausea or vomiting (if zofran fails). Patient  not taking: Reported on 10/06/2020 08/25/20 10/06/20  Varney Biles, MD    Allergies    Cephalexin, Penicillins, Benadryl [diphenhydramine], and Doxycycline  Review of Systems   Review of Systems  Constitutional:  Negative for activity change, chills, diaphoresis and fever.  HENT:  Negative for congestion and sore throat.   Respiratory:  Positive for shortness of breath. Negative for cough, choking and wheezing.   Cardiovascular:  Positive for chest pain and palpitations.  Gastrointestinal:  Positive for abdominal pain, nausea and vomiting.  Genitourinary:  Negative for dysuria.  Musculoskeletal:  Positive for myalgias. Negative for back pain, neck pain and neck stiffness.  Skin:  Negative for rash and wound.  Allergic/Immunologic: Negative for immunocompromised state.  Neurological:  Positive for weakness. Negative for seizures, syncope, speech difficulty, numbness and headaches.  Psychiatric/Behavioral:  Negative for confusion.    Physical Exam Updated Vital Signs BP 118/71   Pulse (!) 111   Temp 99.1 F (37.3 C) (Oral)   Resp 20   Ht '5\' 3"'  (1.6 m)   Wt 57.2 kg   SpO2 100%   BMI 22.32 kg/m   Physical Exam Vitals and nursing note reviewed.  Constitutional:      General: She is not in acute distress.     Appearance: She is ill-appearing. She is not toxic-appearing or diaphoretic.  HENT:     Head: Normocephalic.  Eyes:     Conjunctiva/sclera: Conjunctivae normal.  Cardiovascular:     Rate and Rhythm: Regular rhythm. Tachycardia present.     Heart sounds: No murmur heard.   No friction rub. No gallop.  Pulmonary:     Effort: Pulmonary effort is normal. No respiratory distress.     Breath sounds: No stridor. No wheezing, rhonchi or rales.     Comments: Tachypneic with clear lungs Chest:     Chest wall: No tenderness.  Abdominal:     General: There is no distension.     Palpations: Abdomen is soft. There is no mass.     Tenderness: There is abdominal tenderness. There is no right CVA tenderness, left CVA tenderness, guarding or rebound.     Hernia: No hernia is present.     Comments:  Generalized tenderness to palpation throughout the abdomen.  No rebound or guarding.  Musculoskeletal:     Cervical back: Neck supple.     Right lower leg: No edema.     Left lower leg: No edema.  Skin:    General: Skin is warm.     Coloration: Skin is not jaundiced.     Findings: No rash.  Neurological:     General: No focal deficit present.     Mental Status: She is alert.     Comments: Alert and oriented x3  Psychiatric:        Behavior: Behavior normal.    ED Results / Procedures / Treatments   Labs (all labs ordered are listed, but only abnormal results are displayed) Labs Reviewed  BETA-HYDROXYBUTYRIC ACID - Abnormal; Notable for the following components:      Result Value   Beta-Hydroxybutyric Acid 5.41 (*)    All other components within normal limits  CBC WITH DIFFERENTIAL/PLATELET - Abnormal; Notable for the following components:   WBC 13.2 (*)    Hemoglobin 11.8 (*)    HCT 35.9 (*)    Neutro Abs 10.3 (*)    Abs Immature Granulocytes 0.10 (*)    All other components within normal limits  BLOOD GAS, VENOUS - Abnormal;  Notable for the following components:   pH, Ven 7.219 (*)     pCO2, Ven 23.6 (*)    pO2, Ven 53.7 (*)    Bicarbonate 9.3 (*)    Acid-base deficit 17.2 (*)    All other components within normal limits  COMPREHENSIVE METABOLIC PANEL - Abnormal; Notable for the following components:   Sodium 127 (*)    CO2 10 (*)    Glucose, Bld 765 (*)    BUN 46 (*)    Creatinine, Ser 2.69 (*)    Albumin 3.2 (*)    GFR, Estimated 24 (*)    Anion gap 18 (*)    All other components within normal limits  BASIC METABOLIC PANEL - Abnormal; Notable for the following components:   Sodium 131 (*)    CO2 11 (*)    Glucose, Bld 736 (*)    BUN 42 (*)    Creatinine, Ser 2.52 (*)    GFR, Estimated 26 (*)    Anion gap 20 (*)    All other components within normal limits  CBG MONITORING, ED - Abnormal; Notable for the following components:   Glucose-Capillary >600 (*)    All other components within normal limits  CBG MONITORING, ED - Abnormal; Notable for the following components:   Glucose-Capillary >600 (*)    All other components within normal limits  CBG MONITORING, ED - Abnormal; Notable for the following components:   Glucose-Capillary 579 (*)    All other components within normal limits  CBG MONITORING, ED - Abnormal; Notable for the following components:   Glucose-Capillary 574 (*)    All other components within normal limits  CBG MONITORING, ED - Abnormal; Notable for the following components:   Glucose-Capillary 520 (*)    All other components within normal limits  CBG MONITORING, ED - Abnormal; Notable for the following components:   Glucose-Capillary 462 (*)    All other components within normal limits  CBG MONITORING, ED - Abnormal; Notable for the following components:   Glucose-Capillary 516 (*)    All other components within normal limits  TROPONIN I (HIGH SENSITIVITY) - Abnormal; Notable for the following components:   Troponin I (High Sensitivity) 32 (*)    All other components within normal limits  TROPONIN I (HIGH SENSITIVITY) - Abnormal;  Notable for the following components:   Troponin I (High Sensitivity) 20 (*)    All other components within normal limits  RESP PANEL BY RT-PCR (FLU A&B, COVID) ARPGX2  LIPASE, BLOOD  BETA-HYDROXYBUTYRIC ACID  URINALYSIS, ROUTINE W REFLEX MICROSCOPIC  BETA-HYDROXYBUTYRIC ACID  I-STAT BETA HCG BLOOD, ED ([facility], [facility], [facility] ONLY)    EKG EKG Interpretation  Date/Time:  Wednesday December 07 2020 23:09:16 EDT Ventricular Rate:  113 PR Interval:  116 QRS Duration: 85 QT Interval:  330 QTC Calculation: 453 R Axis:   94 Text Interpretation: Sinus tachycardia Borderline right axis deviation LVH by voltage Confirmed by Ripley Fraise [phone]) on 12/07/2020 11:11:20 PM  Radiology DG Chest 2 View  Result Date: 12/07/2020 CLINICAL DATA:  Chest pain EXAM: CHEST - 2 VIEW COMPARISON:  10/06/2020 FINDINGS: The heart size and mediastinal contours are within normal limits. Both lungs are clear. The visualized skeletal structures are unremarkable. IMPRESSION: Normal study. Electronically Signed   By: Rolm Baptise M.D.   On: 12/07/2020 22:27    Procedures .Critical Care  Date/Time: 12/08/2020 3:18 AM Performed by: Joanne Gavel, PA-C Authorized by: Joanne Gavel, PA-C   Critical care provider statement:  Critical care time (minutes):  50   Critical care time was exclusive of:  Separately billable procedures and treating other patients and teaching time   Critical care was necessary to treat or prevent imminent or life-threatening deterioration of the following conditions:  Metabolic crisis   Critical care was time spent personally by me on the following activities:  Ordering and performing treatments and interventions, ordering and review of laboratory studies, ordering and review of radiographic studies, pulse oximetry, re-evaluation of patient's condition, review of old charts, obtaining history from patient or surrogate, examination of patient, evaluation of patient's response to treatment and  development of treatment plan with patient or surrogate   I assumed direction of critical care for this patient from another provider in my specialty: no     Care discussed with: admitting provider     Medications Ordered in ED Medications  insulin regular, human (MYXREDLIN) 100 units/ 100 mL infusion (5 Units/hr Intravenous New Bag/Given 12/08/20 0049)  lactated ringers infusion ( Intravenous New Bag/Given 12/08/20 0026)  dextrose 5 % in lactated ringers infusion (0 mLs Intravenous Hold 12/08/20 0026)  dextrose 50 % solution 0-50 mL (has no administration in time range)  lactated ringers bolus 1,144 mL (0 mL/kg  57.2 kg Intravenous Stopped 12/08/20 0003)  ondansetron (ZOFRAN) injection 4 mg (4 mg Intravenous Given 12/07/20 2328)  lactated ringers bolus 1,144 mL (0 mL/kg  57.2 kg Intravenous Stopped 12/08/20 0029)  potassium chloride 10 mEq in 100 mL IVPB (10 mEq Intravenous New Bag/Given 12/08/20 0205)    ED Course  I have reviewed the triage vital signs and the nursing notes.  Pertinent labs & imaging results that were available during my care of the patient were reviewed by me and considered in my medical decision making (see chart for details).    MDM Rules/Calculators/A&P                           30 year old female history of diabetes mellitus type 1, STIs, hyperlipidemia who presents the emergency department with chest pain, shortness of breath, myalgias, and vomiting.  EMS noted that patient was hyperglycemic in route.  Oral temp 99.1 in the ED.  Tachycardic in the 120s.  Normotensive.  No hypoxia.  On exam, she appears to have [place] respirations with tachypnea.  She has generalized tenderness palpation throughout the abdomen, but no focal tenderness.  Initially strongly suspicious for DKA given the patient's history.  However, it is little unclear as to what could be contributing to the patient's DKA today since she reports that her pump has been functioning properly at home.   Glucose 765 with bicarb of 10 and elevated anion gap of 18.  She has a metabolic acidosis on her labs.  Patient was started on Endo tool and given 2500 cc of LR.  Heart rate has been slowly downtrending.  Tachypnea has resolved.  Given chest pain, troponin was obtained and was elevated at 32.  However, I have a lower suspicion for ACS.  Elevated troponin would be more consistent with persistent tachycardia from demand related to DKA or from AKI since her creatinine is 2.69 today, up from 1.42.  Repeat troponin is pending.  On repeat evaluation, patient reports that her chest pain has resolved.  The patient was discussed and independently evaluated by Dr. Christy Gentles, attending physician.  Consult to the hospitalist team.  Dr. Hal Hope will accept the patient for admission for DKA. The patient appears reasonably stabilized  for admission considering the current resources, flow, and capabilities available in the ED at this time, and I doubt any other [facility] requiring further screening and/or treatment in the ED prior to admission.   Final Clinical Impression(s) / ED Diagnoses Final diagnoses:  Diabetic ketoacidosis without coma associated with type 1 diabetes mellitus [facility])    Rx / DC Orders ED Discharge Orders     None        Joanne Gavel, PA-C 12/08/20 0323    Ripley Fraise, MD 12/09/20 [phone]

## 2020-12-07 NOTE — ED Notes (Signed)
Patient states she is having chest pain with nausea and vomiting. Patient did vomit before I entered the room.

## 2020-12-07 NOTE — ED Triage Notes (Signed)
Pt arrived via EMS. Pt has been feeling ill since 4am on 12/07/20. Pt has diabetes and states her insulin has not been working. Pt's BG was 588 with EMS. Pt is a type 1 diabetic.

## 2020-12-08 ENCOUNTER — Encounter (HOSPITAL_COMMUNITY): Payer: Self-pay | Admitting: Internal Medicine

## 2020-12-08 ENCOUNTER — Observation Stay (HOSPITAL_BASED_OUTPATIENT_CLINIC_OR_DEPARTMENT_OTHER): Payer: Medicaid Other

## 2020-12-08 DIAGNOSIS — E1010 Type 1 diabetes mellitus with ketoacidosis without coma: Secondary | ICD-10-CM | POA: Diagnosis not present

## 2020-12-08 DIAGNOSIS — R079 Chest pain, unspecified: Secondary | ICD-10-CM

## 2020-12-08 LAB — URINALYSIS, ROUTINE W REFLEX MICROSCOPIC
Bilirubin Urine: NEGATIVE
Glucose, UA: >=500 mg/dL — AB
Ketones, ur: 20 mg/dL — AB
Nitrite: NEGATIVE
Protein, ur: 100 mg/dL — AB
Specific Gravity, Urine: 1.017 (ref 1.005–1.030)
WBC, UA: >50 WBC/hpf — ABNORMAL HIGH (ref 0–5)
pH: 5 (ref 5.0–8.0)

## 2020-12-08 LAB — GLUCOSE, CAPILLARY
Glucose-Capillary: 182 mg/dL — ABNORMAL HIGH (ref 70–99)
Glucose-Capillary: 153 mg/dL — ABNORMAL HIGH (ref 70–99)
Glucose-Capillary: 133 mg/dL — ABNORMAL HIGH (ref 70–99)
Glucose-Capillary: 137 mg/dL — ABNORMAL HIGH (ref 70–99)
Glucose-Capillary: 185 mg/dL — ABNORMAL HIGH (ref 70–99)
Glucose-Capillary: 186 mg/dL — ABNORMAL HIGH (ref 70–99)
Glucose-Capillary: 308 mg/dL — ABNORMAL HIGH (ref 70–99)
Glucose-Capillary: 329 mg/dL — ABNORMAL HIGH (ref 70–99)

## 2020-12-08 LAB — CBG MONITORING, ED
Glucose-Capillary: 520 mg/dL (ref 70–99)
Glucose-Capillary: 574 mg/dL (ref 70–99)
Glucose-Capillary: 579 mg/dL (ref 70–99)
Glucose-Capillary: >600 mg/dL (ref 70–99)
Glucose-Capillary: 462 mg/dL — ABNORMAL HIGH (ref 70–99)
Glucose-Capillary: 516 mg/dL (ref 70–99)
Glucose-Capillary: 451 mg/dL — ABNORMAL HIGH (ref 70–99)
Glucose-Capillary: 411 mg/dL — ABNORMAL HIGH (ref 70–99)
Glucose-Capillary: 324 mg/dL — ABNORMAL HIGH (ref 70–99)
Glucose-Capillary: 290 mg/dL — ABNORMAL HIGH (ref 70–99)
Glucose-Capillary: 185 mg/dL — ABNORMAL HIGH (ref 70–99)

## 2020-12-08 LAB — CBC
HCT: 25.2 % — ABNORMAL LOW (ref 36.0–46.0)
Hemoglobin: 8.5 g/dL — ABNORMAL LOW (ref 12.0–15.0)
MCH: 26.8 pg (ref 26.0–34.0)
MCHC: 33.7 g/dL (ref 30.0–36.0)
MCV: 79.5 fL — ABNORMAL LOW (ref 80.0–100.0)
Platelets: 302 10*3/uL (ref 150–400)
RBC: 3.17 MIL/uL — ABNORMAL LOW (ref 3.87–5.11)
RDW: 13.1 % (ref 11.5–15.5)
WBC: 18 10*3/uL — ABNORMAL HIGH (ref 4.0–10.5)
nRBC: 0 % (ref 0.0–0.2)

## 2020-12-08 LAB — ECHOCARDIOGRAM COMPLETE
AR max vel: 1.08 cm2
AV Area VTI: 1.1 cm2
AV Area mean vel: 1.07 cm2
AV Mean grad: 4 mmHg
AV Peak grad: 7.7 mmHg
Ao pk vel: 1.39 m/s
Area-P 1/2: 5.54 cm2
Calc EF: 55.5 %
Height: 63 in
S' Lateral: 2.7 cm
Single Plane A2C EF: 41.6 %
Single Plane A4C EF: 67.2 %
Weight: 2016 oz

## 2020-12-08 LAB — COMPREHENSIVE METABOLIC PANEL
ALT: 22 U/L (ref 0–44)
AST: 16 U/L (ref 15–41)
Albumin: 3.2 g/dL — ABNORMAL LOW (ref 3.5–5.0)
Alkaline Phosphatase: 79 U/L (ref 38–126)
Anion gap: 18 — ABNORMAL HIGH (ref 5–15)
BUN: 46 mg/dL — ABNORMAL HIGH (ref 6–20)
CO2: 10 mmol/L — ABNORMAL LOW (ref 22–32)
Calcium: 9.5 mg/dL (ref 8.9–10.3)
Chloride: 99 mmol/L (ref 98–111)
Creatinine, Ser: 2.69 mg/dL — ABNORMAL HIGH (ref 0.44–1.00)
GFR, Estimated: 24 mL/min — ABNORMAL LOW (ref >60)
Glucose, Bld: 765 mg/dL (ref 70–99)
Potassium: 4.2 mmol/L (ref 3.5–5.1)
Sodium: 127 mmol/L — ABNORMAL LOW (ref 135–145)
Total Bilirubin: 1.2 mg/dL (ref 0.3–1.2)
Total Protein: 7.3 g/dL (ref 6.5–8.1)

## 2020-12-08 LAB — BASIC METABOLIC PANEL
Anion gap: 20 — ABNORMAL HIGH (ref 5–15)
Anion gap: 11 (ref 5–15)
Anion gap: 6 (ref 5–15)
BUN: 42 mg/dL — ABNORMAL HIGH (ref 6–20)
BUN: 41 mg/dL — ABNORMAL HIGH (ref 6–20)
BUN: 38 mg/dL — ABNORMAL HIGH (ref 6–20)
CO2: 11 mmol/L — ABNORMAL LOW (ref 22–32)
CO2: 16 mmol/L — ABNORMAL LOW (ref 22–32)
CO2: 21 mmol/L — ABNORMAL LOW (ref 22–32)
Calcium: 9.6 mg/dL (ref 8.9–10.3)
Calcium: 9.2 mg/dL (ref 8.9–10.3)
Calcium: 9.2 mg/dL (ref 8.9–10.3)
Chloride: 100 mmol/L (ref 98–111)
Chloride: 104 mmol/L (ref 98–111)
Chloride: 109 mmol/L (ref 98–111)
Creatinine, Ser: 2.52 mg/dL — ABNORMAL HIGH (ref 0.44–1.00)
Creatinine, Ser: 2.34 mg/dL — ABNORMAL HIGH (ref 0.44–1.00)
Creatinine, Ser: 2.3 mg/dL — ABNORMAL HIGH (ref 0.44–1.00)
GFR, Estimated: 26 mL/min — ABNORMAL LOW (ref >60)
GFR, Estimated: 28 mL/min — ABNORMAL LOW (ref >60)
GFR, Estimated: 29 mL/min — ABNORMAL LOW (ref >60)
Glucose, Bld: 736 mg/dL (ref 70–99)
Glucose, Bld: 506 mg/dL (ref 70–99)
Glucose, Bld: 146 mg/dL — ABNORMAL HIGH (ref 70–99)
Potassium: 5 mmol/L (ref 3.5–5.1)
Potassium: 4.1 mmol/L (ref 3.5–5.1)
Potassium: 3.5 mmol/L (ref 3.5–5.1)
Sodium: 131 mmol/L — ABNORMAL LOW (ref 135–145)
Sodium: 131 mmol/L — ABNORMAL LOW (ref 135–145)
Sodium: 136 mmol/L (ref 135–145)

## 2020-12-08 LAB — RESP PANEL BY RT-PCR (FLU A&B, COVID) ARPGX2
Influenza A by PCR: NEGATIVE
Influenza B by PCR: NEGATIVE
SARS Coronavirus 2 by RT PCR: NEGATIVE

## 2020-12-08 LAB — PROCALCITONIN: Procalcitonin: 3.95 ng/mL

## 2020-12-08 LAB — HEMOGLOBIN A1C
Hgb A1c MFr Bld: 13 % — ABNORMAL HIGH (ref 4.8–5.6)
Mean Plasma Glucose: 326.4 mg/dL

## 2020-12-08 LAB — BETA-HYDROXYBUTYRIC ACID
Beta-Hydroxybutyric Acid: 5.41 mmol/L — ABNORMAL HIGH (ref 0.05–0.27)
Beta-Hydroxybutyric Acid: 2.28 mmol/L — ABNORMAL HIGH (ref 0.05–0.27)
Beta-Hydroxybutyric Acid: 1.73 mmol/L — ABNORMAL HIGH (ref 0.05–0.27)

## 2020-12-08 LAB — LIPASE, BLOOD: Lipase: 21 U/L (ref 11–51)

## 2020-12-08 LAB — MRSA NEXT GEN BY PCR, NASAL: MRSA by PCR Next Gen: NOT DETECTED

## 2020-12-08 LAB — TROPONIN I (HIGH SENSITIVITY)
Troponin I (High Sensitivity): 20 ng/L — ABNORMAL HIGH (ref <18)
Troponin I (High Sensitivity): 30 ng/L — ABNORMAL HIGH (ref <18)
Troponin I (High Sensitivity): 32 ng/L — ABNORMAL HIGH (ref <18)

## 2020-12-08 MED ORDER — DEXTROSE 50 % IV SOLN: 0.0000 mL | INTRAVENOUS | Status: DC | PRN

## 2020-12-08 MED ORDER — CIPROFLOXACIN IN D5W 400 MG/200ML IV SOLN
400.0000 mg | Freq: Two times a day (BID) | INTRAVENOUS | Status: DC
  Administered 2020-12-08 – 2020-12-09 (×3): 400 mg via INTRAVENOUS
  Filled 2020-12-08 (×3): qty 200

## 2020-12-08 MED ORDER — ENOXAPARIN SODIUM 30 MG/0.3ML IJ SOSY
30.0000 mg | PREFILLED_SYRINGE | INTRAMUSCULAR | Status: DC
  Administered 2020-12-08: 30 mg via SUBCUTANEOUS
  Filled 2020-12-08 (×2): qty 0.3

## 2020-12-08 MED ORDER — INSULIN REGULAR(HUMAN) IN NACL 100-0.9 UT/100ML-% IV SOLN
INTRAVENOUS | Status: DC
  Administered 2020-12-08: 5 [IU]/h via INTRAVENOUS

## 2020-12-08 MED ORDER — POTASSIUM CHLORIDE 10 MEQ/100ML IV SOLN
10.0000 meq | INTRAVENOUS | Status: AC
  Administered 2020-12-08 (×2): 10 meq via INTRAVENOUS
  Filled 2020-12-08 (×2): qty 100

## 2020-12-08 MED ORDER — DEXTROSE IN LACTATED RINGERS 5 % IV SOLN: INTRAVENOUS | Status: DC

## 2020-12-08 MED ORDER — CHLORHEXIDINE GLUCONATE CLOTH 2 % EX PADS
6.0000 | MEDICATED_PAD | Freq: Every day | CUTANEOUS | Status: DC
  Administered 2020-12-08 – 2020-12-09 (×2): 6 via TOPICAL

## 2020-12-08 MED ORDER — INSULIN ASPART 100 UNIT/ML IJ SOLN
5.0000 [IU] | Freq: Once | INTRAMUSCULAR | Status: AC
  Administered 2020-12-08: 5 [IU] via SUBCUTANEOUS

## 2020-12-08 MED ORDER — CHLORHEXIDINE GLUCONATE 0.12 % MT SOLN
15.0000 mL | Freq: Two times a day (BID) | OROMUCOSAL | Status: DC
  Administered 2020-12-08 – 2020-12-09 (×3): 15 mL via OROMUCOSAL
  Filled 2020-12-08 (×2): qty 15

## 2020-12-08 MED ORDER — SODIUM CHLORIDE 0.9 % IV SOLN: INTRAVENOUS | Status: DC

## 2020-12-08 MED ORDER — INSULIN REGULAR(HUMAN) IN NACL 100-0.9 UT/100ML-% IV SOLN
INTRAVENOUS | Status: DC
  Administered 2020-12-08: 5 [IU]/h via INTRAVENOUS
  Filled 2020-12-08: qty 100

## 2020-12-08 MED ORDER — LACTATED RINGERS IV BOLUS
20.0000 mL/kg | Freq: Once | INTRAVENOUS | Status: AC
  Administered 2020-12-08: 1144 mL via INTRAVENOUS

## 2020-12-08 MED ORDER — LACTATED RINGERS IV SOLN: INTRAVENOUS | Status: DC

## 2020-12-08 MED ORDER — INSULIN ASPART 100 UNIT/ML IJ SOLN
0.0000 [IU] | Freq: Three times a day (TID) | INTRAMUSCULAR | Status: DC
  Administered 2020-12-08: 7 [IU] via SUBCUTANEOUS
  Administered 2020-12-09: 1 [IU] via SUBCUTANEOUS
  Administered 2020-12-09: 3 [IU] via SUBCUTANEOUS
  Administered 2020-12-09: 5 [IU] via SUBCUTANEOUS

## 2020-12-08 MED ORDER — INSULIN GLARGINE-YFGN 100 UNIT/ML ~~LOC~~ SOLN
12.0000 [IU] | Freq: Every day | SUBCUTANEOUS | Status: DC
  Administered 2020-12-08: 12 [IU] via SUBCUTANEOUS
  Filled 2020-12-08 (×2): qty 0.12

## 2020-12-08 MED ORDER — ASPIRIN EC 81 MG PO TBEC
81.0000 mg | DELAYED_RELEASE_TABLET | Freq: Every day | ORAL | Status: DC
  Administered 2020-12-08 – 2020-12-09 (×2): 81 mg via ORAL
  Filled 2020-12-08 (×2): qty 1

## 2020-12-08 MED ORDER — INSULIN ASPART 100 UNIT/ML IJ SOLN
0.0000 [IU] | INTRAMUSCULAR | Status: DC
  Administered 2020-12-08: 2 [IU] via SUBCUTANEOUS

## 2020-12-08 MED ORDER — ORAL CARE MOUTH RINSE
15.0000 mL | Freq: Two times a day (BID) | OROMUCOSAL | Status: DC
  Administered 2020-12-09: 15 mL via OROMUCOSAL

## 2020-12-08 NOTE — H&P (Signed)
History and Physical    Ashley Freeman CBS:[id number] DOB: [birth year] DOA: 12/07/2020  PCP: [name], PA-C  Patient coming from: Home.  Chief Complaint: Elevated blood sugar.  HPI: Ashley Freeman is a 30 y.o. female with history of diabetes mellitus type 1, chronic kidney disease stage II presents to the ER because of elevated blood sugar.  Patient states she thinks insulin pump may be malfunctioning her blood sugars were running high.  Denies any nausea vomiting or abdominal pain or diarrhea.  Has been having some chest pressure over the last 24 hours.  ED Course: In the ER patient blood sugar was more than 700 with anion gap of 18 patient was started on insulin infusion IV fluids for DKA.  COVID test was negative EKG shows sinus tachycardia.  High sensitive troponin was 32 and 20.  Hemoglobin 11.8 creatinine is 2.6.  Review of Systems: As per HPI, rest all negative.   Past Medical History:  Diagnosis Date   Abscess, gluteal, right 08/24/2013   AKI (acute kidney injury) ([place]) 07/26/2014   Anemia 02/19/2012   Bartholin's gland abscess 09/19/2013   BV (bacterial vaginosis) 11/24/2015   Diabetes mellitus type I ([place]) 2001   Diagnosed at age 50 ; Type I   Diarrhea 05/30/2016   DKA (diabetic ketoacidoses) 08/19/2013   Also in 2018   Gonorrhea 08/2011   Treated in 09/2011   History of trichomoniasis 05/31/2016   Hyperlipidemia 03/28/2016   Sepsis ([place]) 09/19/2013    Past Surgical History:  Procedure Laterality Date   CESAREAN SECTION N/A 10/05/2019   Procedure: CESAREAN SECTION;  Surgeon: Aletha Halim, MD;  Location: [facility];  Service: Obstetrics;  Laterality: N/A;   INCISION AND DRAINAGE ABSCESS Left 09/28/2019   Procedure: INCISION AND DRAINAGE VULVAR ABCESS;  Surgeon: [name], MD;  Location: [place];  Service: Gynecology;  Laterality: Left;   INCISION AND DRAINAGE PERIRECTAL ABSCESS Right 08/18/2013   Procedure: IRRIGATION AND DEBRIDEMENT  GLUTEAL ABSCESS;  Surgeon: Ralene Ok, MD;  Location: [place];  Service: General;  Laterality: Right;   INCISION AND DRAINAGE PERIRECTAL ABSCESS Right 09/19/2013   Procedure: IRRIGATION AND DEBRIDEMENT RIGHT GLUTEAL AND LABIAL ABSCESSES;  Surgeon: Ralene Ok, MD;  Location: [place];  Service: General;  Laterality: Right;   INCISION AND DRAINAGE PERIRECTAL ABSCESS Right 09/24/2013   Procedure: IRRIGATION AND DEBRIDEMENT PERIRECTAL ABSCESS;  Surgeon: Gwenyth Ober, MD;  Location: [place];  Service: General;  Laterality: Right;     reports that she has never smoked. She has never used smokeless tobacco. She reports that she does not currently use alcohol. She reports that she does not use drugs.  Allergies  Allergen Reactions   Cephalexin Anaphylaxis   Penicillins Hives and Rash    Has patient had a PCN reaction causing immediate rash, facial/tongue/throat swelling, SOB or lightheadedness with hypotension: Yes Has patient had a PCN reaction causing severe rash involving mucus membranes or skin necrosis: No Has patient had a PCN reaction that required hospitalization: Yes Has patient had a PCN reaction occurring within the last 10 years: No If all of the above answers are "NO", then may proceed with Cephalosporin use.   Benadryl [Diphenhydramine] Itching   Doxycycline Itching    Family History  Problem Relation Age of Onset   Asthma Mother    Carpal tunnel syndrome Mother    Gout Father    Diabetes Paternal Grandmother    Anesthesia problems Neg Hx   History and Physical    Ashley Freeman MRN:[id number] DOB: 11/26/1990 DOA: 12/07/2020  PCP: Stein, William J, PA-C  Patient coming from: Home.  Chief Complaint: Elevated blood sugar.  HPI: Ashley Freeman is a 30 y.o. female with history of diabetes mellitus type 1, chronic kidney disease stage II presents to the ER because of elevated blood sugar.  Patient states she thinks insulin pump may be malfunctioning her blood sugars were running high.  Denies any nausea vomiting or abdominal pain or diarrhea.  Has been having some chest pressure over the last 24 hours.  ED Course: In the ER patient blood sugar was more than 700 with anion gap of 18 patient was started on insulin infusion IV fluids for DKA.  COVID test was negative EKG shows sinus tachycardia.  High sensitive troponin was 32 and 20.  Hemoglobin 11.8 creatinine is 2.6.  Review of Systems: As per HPI, rest all negative.   Past Medical History:  Diagnosis Date   Abscess, gluteal, right 08/24/2013   AKI (acute kidney injury) ([facility]) 07/26/2014   Anemia 02/19/2012   Bartholin's gland abscess 09/19/2013   BV (bacterial vaginosis) 11/24/2015   Diabetes mellitus type I ([facility]) 2001   Diagnosed at age 9 ; Type I   Diarrhea 05/30/2016   DKA (diabetic ketoacidoses) 08/19/2013   Also in 2018   Gonorrhea 08/2011   Treated in 09/2011   History of trichomoniasis 05/31/2016   Hyperlipidemia 03/28/2016   Sepsis ([facility]) 09/19/2013    Past Surgical History:  Procedure Laterality Date   CESAREAN SECTION N/A 10/05/2019   Procedure: CESAREAN SECTION;  Surgeon: Pickens, Charlie, MD;  [facility]: [facility];  Service: Obstetrics;  Laterality: N/A;   INCISION AND DRAINAGE ABSCESS Left 09/28/2019   Procedure: INCISION AND DRAINAGE VULVAR ABCESS;  Surgeon: Ferguson, John V, MD;  Location: [facility];  Service: Gynecology;  Laterality: Left;   INCISION AND DRAINAGE PERIRECTAL ABSCESS Right 08/18/2013   Procedure: IRRIGATION AND DEBRIDEMENT  GLUTEAL ABSCESS;  Surgeon: Armando Ramirez, MD;  [facility]: [facility];  Service: General;  Laterality: Right;   INCISION AND DRAINAGE PERIRECTAL ABSCESS Right 09/19/2013   Procedure: IRRIGATION AND DEBRIDEMENT RIGHT GLUTEAL AND LABIAL ABSCESSES;  Surgeon: Armando Ramirez, MD;  [facility];  Service: General;  Laterality: Right;   INCISION AND DRAINAGE PERIRECTAL ABSCESS Right 09/24/2013   Procedure: IRRIGATION AND DEBRIDEMENT PERIRECTAL ABSCESS;  Surgeon: James O Wyatt, MD;  [facility];  Service: General;  Laterality: Right;     reports that she has never smoked. She has never used smokeless tobacco. She reports that she does not currently use alcohol. She reports that she does not use drugs.  Allergies  Allergen Reactions   Cephalexin Anaphylaxis   Penicillins Hives and Rash    Has patient had a PCN reaction causing immediate rash, facial/tongue/throat swelling, SOB or lightheadedness with hypotension: Yes Has patient had a PCN reaction causing severe rash involving mucus membranes or skin necrosis: No Has patient had a PCN reaction that required hospitalization: Yes Has patient had a PCN reaction occurring within the last 10 years: No If all of the above answers are "NO", then may proceed with Cephalosporin use.   Benadryl [Diphenhydramine] Itching   Doxycycline Itching    Family History  Problem Relation Age of Onset   Asthma Mother    Carpal tunnel syndrome Mother    Gout Father    Diabetes Paternal Grandmother    Anesthesia problems Neg Hx        History and Physical    Ashley Freeman MRN:[id number] DOB: 11/26/1990 DOA: 12/07/2020  PCP: Stein, William J, PA-C  Patient coming from: Home.  Chief Complaint: Elevated blood sugar.  HPI: Ashley Freeman is a 30 y.o. female with history of diabetes mellitus type 1, chronic kidney disease stage II presents to the ER because of elevated blood sugar.  Patient states she thinks insulin pump may be malfunctioning her blood sugars were running high.  Denies any nausea vomiting or abdominal pain or diarrhea.  Has been having some chest pressure over the last 24 hours.  ED Course: In the ER patient blood sugar was more than 700 with anion gap of 18 patient was started on insulin infusion IV fluids for DKA.  COVID test was negative EKG shows sinus tachycardia.  High sensitive troponin was 32 and 20.  Hemoglobin 11.8 creatinine is 2.6.  Review of Systems: As per HPI, rest all negative.   Past Medical History:  Diagnosis Date   Abscess, gluteal, right 08/24/2013   AKI (acute kidney injury) ([facility]) 07/26/2014   Anemia 02/19/2012   Bartholin's gland abscess 09/19/2013   BV (bacterial vaginosis) 11/24/2015   Diabetes mellitus type I ([facility]) 2001   Diagnosed at age 9 ; Type I   Diarrhea 05/30/2016   DKA (diabetic ketoacidoses) 08/19/2013   Also in 2018   Gonorrhea 08/2011   Treated in 09/2011   History of trichomoniasis 05/31/2016   Hyperlipidemia 03/28/2016   Sepsis ([facility]) 09/19/2013    Past Surgical History:  Procedure Laterality Date   CESAREAN SECTION N/A 10/05/2019   Procedure: CESAREAN SECTION;  Surgeon: Pickens, Charlie, MD;  [facility];  Service: Obstetrics;  Laterality: N/A;   INCISION AND DRAINAGE ABSCESS Left 09/28/2019   Procedure: INCISION AND DRAINAGE VULVAR ABCESS;  Surgeon: Ferguson, John V, MD;  [facility];  Service: Gynecology;  Laterality: Left;   INCISION AND DRAINAGE PERIRECTAL ABSCESS Right 08/18/2013   Procedure: IRRIGATION AND DEBRIDEMENT  GLUTEAL ABSCESS;  Surgeon: Armando Ramirez, MD;  [facility];  Service: General;  Laterality: Right;   INCISION AND DRAINAGE PERIRECTAL ABSCESS Right 09/19/2013   Procedure: IRRIGATION AND DEBRIDEMENT RIGHT GLUTEAL AND LABIAL ABSCESSES;  Surgeon: Armando Ramirez, MD;  [facility];  Service: General;  Laterality: Right;   INCISION AND DRAINAGE PERIRECTAL ABSCESS Right 09/24/2013   Procedure: IRRIGATION AND DEBRIDEMENT PERIRECTAL ABSCESS;  Surgeon: James O Wyatt, MD;  [facility];  Service: General;  Laterality: Right;     reports that she has never smoked. She has never used smokeless tobacco. She reports that she does not currently use alcohol. She reports that she does not use drugs.  Allergies  Allergen Reactions   Cephalexin Anaphylaxis   Penicillins Hives and Rash    Has patient had a PCN reaction causing immediate rash, facial/tongue/throat swelling, SOB or lightheadedness with hypotension: Yes Has patient had a PCN reaction causing severe rash involving mucus membranes or skin necrosis: No Has patient had a PCN reaction that required hospitalization: Yes Has patient had a PCN reaction occurring within the last 10 years: No If all of the above answers are "NO", then may proceed with Cephalosporin use.   Benadryl [Diphenhydramine] Itching   Doxycycline Itching    Family History  Problem Relation Age of Onset   Asthma Mother    Carpal tunnel syndrome Mother    Gout Father    Diabetes Paternal Grandmother    Anesthesia problems Neg Hx       History and Physical    Ashley Freeman CBS:[id number] DOB: [birth year] DOA: 12/07/2020  PCP: [name], PA-C  Patient coming from: Home.  Chief Complaint: Elevated blood sugar.  HPI: Ashley Freeman is a 30 y.o. female with history of diabetes mellitus type 1, chronic kidney disease stage II presents to the ER because of elevated blood sugar.  Patient states she thinks insulin pump may be malfunctioning her blood sugars were running high.  Denies any nausea vomiting or abdominal pain or diarrhea.  Has been having some chest pressure over the last 24 hours.  ED Course: In the ER patient blood sugar was more than 700 with anion gap of 18 patient was started on insulin infusion IV fluids for DKA.  COVID test was negative EKG shows sinus tachycardia.  High sensitive troponin was 32 and 20.  Hemoglobin 11.8 creatinine is 2.6.  Review of Systems: As per HPI, rest all negative.   Past Medical History:  Diagnosis Date   Abscess, gluteal, right 08/24/2013   AKI (acute kidney injury) ([place]) 07/26/2014   Anemia 02/19/2012   Bartholin's gland abscess 09/19/2013   BV (bacterial vaginosis) 11/24/2015   Diabetes mellitus type I ([place]) 2001   Diagnosed at age 50 ; Type I   Diarrhea 05/30/2016   DKA (diabetic ketoacidoses) 08/19/2013   Also in 2018   Gonorrhea 08/2011   Treated in 09/2011   History of trichomoniasis 05/31/2016   Hyperlipidemia 03/28/2016   Sepsis ([place]) 09/19/2013    Past Surgical History:  Procedure Laterality Date   CESAREAN SECTION N/A 10/05/2019   Procedure: CESAREAN SECTION;  Surgeon: Aletha Halim, MD;  [facility];  Service: Obstetrics;  Laterality: N/A;   INCISION AND DRAINAGE ABSCESS Left 09/28/2019   Procedure: INCISION AND DRAINAGE VULVAR ABCESS;  Surgeon: [name], MD;  Location: [place];  Service: Gynecology;  Laterality: Left;   INCISION AND DRAINAGE PERIRECTAL ABSCESS Right 08/18/2013   Procedure: IRRIGATION AND DEBRIDEMENT  GLUTEAL ABSCESS;  Surgeon: Ralene Ok, MD;  Location: [place];  Service: General;  Laterality: Right;   INCISION AND DRAINAGE PERIRECTAL ABSCESS Right 09/19/2013   Procedure: IRRIGATION AND DEBRIDEMENT RIGHT GLUTEAL AND LABIAL ABSCESSES;  Surgeon: Ralene Ok, MD;  Location: [place];  Service: General;  Laterality: Right;   INCISION AND DRAINAGE PERIRECTAL ABSCESS Right 09/24/2013   Procedure: IRRIGATION AND DEBRIDEMENT PERIRECTAL ABSCESS;  Surgeon: Gwenyth Ober, MD;  Location: [place];  Service: General;  Laterality: Right;     reports that she has never smoked. She has never used smokeless tobacco. She reports that she does not currently use alcohol. She reports that she does not use drugs.  Allergies  Allergen Reactions   Cephalexin Anaphylaxis   Penicillins Hives and Rash    Has patient had a PCN reaction causing immediate rash, facial/tongue/throat swelling, SOB or lightheadedness with hypotension: Yes Has patient had a PCN reaction causing severe rash involving mucus membranes or skin necrosis: No Has patient had a PCN reaction that required hospitalization: Yes Has patient had a PCN reaction occurring within the last 10 years: No If all of the above answers are "NO", then may proceed with Cephalosporin use.   Benadryl [Diphenhydramine] Itching   Doxycycline Itching    Family History  Problem Relation Age of Onset   Asthma Mother    Carpal tunnel syndrome Mother    Gout Father    Diabetes Paternal Grandmother    Anesthesia problems Neg Hx

## 2020-12-08 NOTE — ED Notes (Signed)
Patient is asleep. Insulin continues at 5 units via endotool

## 2020-12-08 NOTE — Progress Notes (Signed)
Patient seen and examined personally, I reviewed the chart, history and physical and admission note, done by admitting physician this morning and agree with the same with following addendum.  Please refer to the morning admission note for more detailed plan of care.  Briefly,   30 year old female with type 1 diabetes on insulin pump, CKD stage IIIa baseline creatinine 1.4-1.6 to the ED with elevated blood sugar.  Patient reports that her insulin pump may have been malfunctioning and her blood sugar has been running high. In the ED found to have blood sugar in 700s with anion gap of 18 patient was placed on DKA protocol with insulin pump IV fluid hydration also found to have AKI on CKD and mildly elevated troponin.  On exam alert awake awake oriented x3 not in acute distress.  Pleasant.  Denies chest pain. Pt has had no nausea or vomiting since coming to ED, no abdomen pain. Some itching/dysurea.  DKA with type 1 diabetes likely from insulin pump malfunction: anion gap has closed bicarb improved no nausea vomiting will start diet, transition to subcu Lantus 12 units, continue 4-hour SSI.  DM coordinator consulted.  Monitor lites CBG  Leukocytosis, suspect UTI, Pro-Cal is elevated, will start on ciprofloxacin, urine culture ordered. AKI on CKD stage IIIa Baseline creatinine 1.4-1.6.  Continue IV rehydration suspected volume depletion from DKA Recent Labs  Lab 12/07/20 2302 12/08/20 0020 12/08/20 0417 12/08/20 0920  BUN 46* 42* 41* 38*  CREATININE 2.69* 2.52* 2.34* 2.30*    Mildly positive troponin/?CP: Suspect in the setting of DKA.  No delta delta trop 32-20-32.  EKG sinus tachycardia LVH.  Echocardiogram pending.  Anemia likely from chronic disease.  Monitor

## 2020-12-08 NOTE — Progress Notes (Signed)
*  PRELIMINARY RESULTS* Echocardiogram 2D Echocardiogram has been performed.  Luisa Hart RDCS 12/08/2020, 11:28 AM

## 2020-12-08 NOTE — Progress Notes (Signed)
Inpatient Diabetes Program Recommendations  AACE/ADA: New Consensus Statement on Inpatient Glycemic Control (2015)  Target Ranges:  Prepandial:   less than 140 mg/dL      Peak postprandial:   less than 180 mg/dL (1-2 hours)      Critically ill patients:  140 - 180 mg/dL   Lab Results  Component Value Date   GLUCAP 185 (H) 12/08/2020   HGBA1C 13.0 (H) 12/08/2020    Review of Glycemic Control  Diabetes history: DM 1 Outpatient Diabetes medications: OmniPod Current orders for Inpatient glycemic control: Lantus 12 units QD, Novolog 0-9 units TID with meals.  HgbA1C 13.1%  Inpatient Diabetes Program Recommendations:    Add Novolog 3 units TID with meals if eating > 50% meal Add HS correction  Pt's family to bring OmniPod. Long discussion with pt about HgbA1C of 13.1% and importance of improving her blood sugar control. Pt states Medicaid stopped paying for her Dexcom and she just quit checking her blood sugars. Said she hasn't been taking care of herself and has lots of stress with 11 year old baby. She will call social worker about getting re-qualified for CGM. In the meantime, she needs to check blood sugars at least 3-4x/day. She understands importance and wants to improve her glycemic control.   Instructed pt to wait 24H to restart pump since Lantus dose was given. Pt voices understanding.  Stressed importance of f/u with Endo on a regular basis. She has upcoming appt on 8/19.  Continue to follow.   Thank you. Lorenda Peck, RD, LDN, CDE Inpatient Diabetes Coordinator [phone]

## 2020-12-08 NOTE — ED Notes (Signed)
Patient is asleep.  

## 2020-12-09 DIAGNOSIS — E1010 Type 1 diabetes mellitus with ketoacidosis without coma: Secondary | ICD-10-CM | POA: Diagnosis not present

## 2020-12-09 LAB — GLUCOSE, CAPILLARY
Glucose-Capillary: 274 mg/dL — ABNORMAL HIGH (ref 70–99)
Glucose-Capillary: 205 mg/dL — ABNORMAL HIGH (ref 70–99)
Glucose-Capillary: 131 mg/dL — ABNORMAL HIGH (ref 70–99)

## 2020-12-09 LAB — PROCALCITONIN: Procalcitonin: 2.47 ng/mL

## 2020-12-09 LAB — BASIC METABOLIC PANEL
Anion gap: 6 (ref 5–15)
BUN: 27 mg/dL — ABNORMAL HIGH (ref 6–20)
CO2: 20 mmol/L — ABNORMAL LOW (ref 22–32)
Calcium: 8.5 mg/dL — ABNORMAL LOW (ref 8.9–10.3)
Chloride: 109 mmol/L (ref 98–111)
Creatinine, Ser: 1.77 mg/dL — ABNORMAL HIGH (ref 0.44–1.00)
GFR, Estimated: 40 mL/min — ABNORMAL LOW (ref >60)
Glucose, Bld: 256 mg/dL — ABNORMAL HIGH (ref 70–99)
Potassium: 3.5 mmol/L (ref 3.5–5.1)
Sodium: 135 mmol/L (ref 135–145)

## 2020-12-09 LAB — CBC
HCT: 26.5 % — ABNORMAL LOW (ref 36.0–46.0)
Hemoglobin: 8.6 g/dL — ABNORMAL LOW (ref 12.0–15.0)
MCH: 26.5 pg (ref 26.0–34.0)
MCHC: 32.5 g/dL (ref 30.0–36.0)
MCV: 81.8 fL (ref 80.0–100.0)
Platelets: 290 10*3/uL (ref 150–400)
RBC: 3.24 MIL/uL — ABNORMAL LOW (ref 3.87–5.11)
RDW: 13.5 % (ref 11.5–15.5)
WBC: 11.4 10*3/uL — ABNORMAL HIGH (ref 4.0–10.5)
nRBC: 0 % (ref 0.0–0.2)

## 2020-12-09 LAB — URINE CULTURE: Culture: 5000 — AB

## 2020-12-09 MED ORDER — GABAPENTIN 100 MG PO CAPS: 200.0000 mg | ORAL_CAPSULE | Freq: Once | ORAL | Status: DC

## 2020-12-09 MED ORDER — INSULIN ASPART 100 UNIT/ML IJ SOLN
5.0000 [IU] | Freq: Three times a day (TID) | INTRAMUSCULAR | Status: DC
  Administered 2020-12-09: 5 [IU] via SUBCUTANEOUS

## 2020-12-09 MED ORDER — INSULIN GLARGINE-YFGN 100 UNIT/ML ~~LOC~~ SOLN
15.0000 [IU] | Freq: Every day | SUBCUTANEOUS | Status: DC
  Administered 2020-12-09: 15 [IU] via SUBCUTANEOUS
  Filled 2020-12-09: qty 0.15

## 2020-12-09 NOTE — Discharge Summary (Signed)
Physician Discharge Summary  Steffie Waggoner [place] FBP:[id number] DOB: [birth year] DOA: 12/07/2020  PCP: [name], PA-C  Admit date: 12/07/2020 Discharge date: 12/09/2020  Admitted From: home Disposition:  home  Recommendations for Outpatient Follow-up:  Follow up with PCP and w/ endocrinology in 1-2 weeks  Home Health:no  Equipment/Devices: none  Discharge Condition: Stable Code Status:   Code Status: Full Code Diet recommendation:  Diet Order             Diet - low sodium heart healthy           Diet Carb Modified Fluid consistency: Thin; Room service appropriate? Yes  Diet effective now                    Brief/Interim Summary: 30 year old female with type 1 diabetes on insulin pump, CKD stage IIIa baseline creatinine 1.4-1.6 to the ED with elevated blood sugar.  Patient reports that her insulin pump may have been malfunctioning and her blood sugar has been running high. In the ED found to have blood sugar in 700s with anion gap of 18 patient was placed on DKA protocol with insulin pump IV fluid hydration also found to have AKI on CKD and mildly elevated troponin.  She was managed with IV insulin drip serial Bient will start insulin pump tonight and resume his baseline insulin 24 hours after her Lantus canMP check  anion gap closed and had no nausea vomiting placed on diet transition to subcu Lantus and sliding scale insulin.  Seen by DM coordinator.  At this time blood sugar overall improved.  Pat continue on correctional insulin dose  Discharge Diagnoses:  DKA with type 1 diabetes likely from insulin pump malfunction: Now on Lantus SSI and Premeal insulin.  Plan is to transition to pump today and hold basal rate 24 hours since lantus use-0 can do correction insulin at home.seen by diabetic coordinator and was very well and patient verbalized understanding giene at home .Hemoglobin A1c uncontrolled at 13 this admission  Recent Labs  Lab 12/08/20 1353  12/08/20 1807 12/08/20 2007 12/09/20 0748 12/09/20 1210  GLUCAP 186* 308* 329* 274* 205*    Leukocytosis, suspect UTI, Pro-Cal is elevated, s/p ciprofloxacin,-urine culture came back Streptococcus agalactiae leukocytosis significantly improved.  Hold off on further antibiotics  AKI on CKD stage IIIa Baseline creatinine 1.4-1.6.  Creatinine improved to baseline  Advised follow-up with PCP/nephrology Recent Labs  Lab 12/07/20 2302 12/08/20 0020 12/08/20 0417 12/08/20 0920 12/09/20 0544  BUN 46* 42* 41* 38* 27*  CREATININE 2.69* 2.52* 2.34* 2.30* 1.77*   Mildly positive troponin/?CP: Suspect in the setting of DKA.  No delta delta trop 32-20-32.  EKG sinus tachycardia LVH.  Echocardiogram normal EF no other acute finding   Anemia likely from chronic disease.  Monitor    Consults: Diabetic coordinator  Subjective: Alert awake oriented not in distress.  Exam: Home today after insulin pump attached  Discharge Exam: Vitals:   12/09/20 0204 12/09/20 0347  BP: (!) 152/100 (!) 132/91  Pulse: 97 91  Resp:  18  Temp:  98.5 F (36.9 C)  SpO2: 100% 100%   General: Pt is alert, awake, not in acute distress Cardiovascular: RRR, S1/S2 +, no rubs, no gallops Respiratory: CTA bilaterally, no wheezing, no rhonchi Abdominal: Soft, NT, ND, bowel sounds + Extremities: no edema, no cyanosis  Discharge Instructions  Discharge Instructions     Diet - low sodium heart healthy   Complete by: As directed    Discharge  instructions   Complete by: As directed    Check blood sugar 3 times a day and bedtime at home. Please do not continue basal rate of insulin until 24 hours after long acting insulin given in hospital If blood sugar running above 200 less than 70 please call your MD to adjust insulin. If blood sugars running less 100 do not use insulin and call MD. If you noticed signs and symptoms of hypoglycemia or low blood sugar like jitteriness, confusion, thirst, tremor, sweating-  Check blood sugar, drink sugary drink/biscuits/sweets to increase sugar level and call MD or return to ER.  Please call call MD or return to ER for similar or worsening recurring problem that brought you to hospital or if any fever,nausea/vomiting,abdominal pain, uncontrolled pain, chest pain,  shortness of breath or any other alarming symptoms.  Please follow-up your doctor as instructed in a week time and call the office for appointment.  Please avoid alcohol, smoking, or any other illicit substance and maintain healthy habits including taking your regular medications as prescribed.  You were cared for by a hospitalist during your hospital stay. If you have any questions about your discharge medications or the care you received while you were in the hospital after you are discharged, you can call the unit and ask to speak with the hospitalist on call if the hospitalist that took care of you is not available.  Once you are discharged, your primary care physician will handle any further medical issues. Please note that NO REFILLS for any discharge medications will be authorized once you are discharged, as it is imperative that you return to your primary care physician (or establish a relationship with a primary care physician if you do not have one) for your aftercare needs so that they can reassess your need for medications and monitor your lab values   Increase activity slowly   Complete by: As directed       Allergies as of 12/09/2020       Reactions   Cephalexin Anaphylaxis   Penicillins Hives, Rash   Has patient had a PCN reaction causing immediate rash, facial/tongue/throat swelling, SOB or lightheadedness with hypotension: Yes Has patient had a PCN reaction causing severe rash involving mucus membranes or skin necrosis: No Has patient had a PCN reaction that required hospitalization: Yes Has patient had a PCN reaction occurring within the last 10 years: No Spoke with pt - childhood  hives told by mom, tried no pcns since, doesn't remember reaction herself    Benadryl [diphenhydramine] Itching   Doxycycline Itching        Medication List     STOP taking these medications    fluconazole 200 MG tablet Commonly known as: DIFLUCAN   insulin glargine 100 UNIT/ML Solostar Pen Commonly known as: LANTUS       TAKE these medications    albuterol 108 (90 Base) MCG/ACT inhaler Commonly known as: VENTOLIN HFA Inhale 2 puffs into the lungs every 4 (four) hours as needed for wheezing or shortness of breath.   blood glucose meter kit and supplies Kit Dispense based on patient and insurance preference. Use up to four times daily as directed. (FOR ICD-9 250.00, 250.01).   Blood Pressure Kit Devi 1 Device by Does not apply route as needed.   cetirizine-pseudoephedrine 5-120 MG tablet Commonly known as: ZYRTEC-D Take 1 tablet by mouth every 12 (twelve) hours as needed for allergies.   Dexcom G6 Sensor Misc USE AS DIRECTED, CHANGE SENSOR EVERY 10  DAYS   Dexcom G6 Transmitter Misc Use to check blood sugar daily   gabapentin 300 MG capsule Commonly known as: NEURONTIN Take 300-900 mg by mouth at bedtime.   insulin aspart 100 UNIT/ML FlexPen Commonly known as: NOVOLOG 0-9 Units, Subcutaneous, 3 times daily with meals, CBG < 70: Implement Hypoglycemia measures CBG 70 - 120: 0 units CBG 121 - 150: 1 unit CBG 151 - 200: 2 units CBG 201 - 250: 3 units CBG 251 - 300: 5 units CBG 301 - 350: 7 units CBG 351 - 400: 9 units CBG > 400: call MD   lamoTRIgine 25 MG tablet Commonly known as: LAMICTAL Take 25 mg by mouth 2 (two) times daily.   Pentips 32G X 4 MM Misc Generic drug: Insulin Pen Needle Use as directed        Follow-up Information     [name], PA-C Follow up in 1 week(s).   Specialty: Physician Assistant Contact information: [place] [address] [phone]                Allergies  Allergen Reactions    Cephalexin Anaphylaxis   Penicillins Hives and Rash    Has patient had a PCN reaction causing immediate rash, facial/tongue/throat swelling, SOB or lightheadedness with hypotension: Yes Has patient had a PCN reaction causing severe rash involving mucus membranes or skin necrosis: No Has patient had a PCN reaction that required hospitalization: Yes Has patient had a PCN reaction occurring within the last 10 years: No Spoke with pt - childhood hives told by mom, tried no pcns since, doesn't remember reaction herself    Benadryl [Diphenhydramine] Itching   Doxycycline Itching    The results of significant diagnostics from this hospitalization (including imaging, microbiology, ancillary and laboratory) are listed below for reference.    Microbiology: Recent Results (from the past 240 hour(s))  Resp Panel by RT-PCR (Flu A&B, Covid) Nasopharyngeal Swab     Status: None   Collection Time: 12/07/20 10:56 PM   Specimen: Nasopharyngeal Swab; Nasopharyngeal(NP) swabs in vial transport medium  Result Value Ref Range Status   SARS Coronavirus 2 by RT PCR NEGATIVE NEGATIVE Final    Comment: (NOTE) SARS-CoV-2 target nucleic acids are NOT DETECTED.  The SARS-CoV-2 RNA is generally detectable in upper respiratory specimens during the acute phase of infection. The lowest concentration of SARS-CoV-2 viral copies this assay can detect is 138 copies/mL. A negative result does not preclude SARS-Cov-2 infection and should not be used as the sole basis for treatment or other patient management decisions. A negative result may occur with  improper specimen collection/handling, submission of specimen other than nasopharyngeal swab, presence of viral mutation(s) within the areas targeted by this assay, and inadequate number of viral copies(<138 copies/mL). A negative result must be combined with clinical observations, patient history, and epidemiological information. The expected result is Negative.  Fact  Sheet for Patients:  EntrepreneurPulse.com.au  Fact Sheet for Healthcare Providers:  IncredibleEmployment.be  This test is no t yet approved or cleared by the [place] FDA and  has been authorized for detection and/or diagnosis of SARS-CoV-2 by FDA under an Emergency Use Authorization (EUA). This EUA will remain  in effect (meaning this test can be used) for the duration of the COVID-19 declaration under Section 564(b)(1) of the Act, 21 U.S.C.section 360bbb-3(b)(1), unless the authorization is terminated  or revoked sooner.       Influenza A by PCR NEGATIVE NEGATIVE Final   Influenza  B by PCR NEGATIVE NEGATIVE Final    Comment: (NOTE) The Xpert Xpress SARS-CoV-2/FLU/RSV plus assay is intended as an aid in the diagnosis of influenza from Nasopharyngeal swab specimens and should not be used as a sole basis for treatment. Nasal washings and aspirates are unacceptable for Xpert Xpress SARS-CoV-2/FLU/RSV testing.  Fact Sheet for Patients: EntrepreneurPulse.com.au  Fact Sheet for Healthcare Providers: IncredibleEmployment.be  This test is not yet approved or cleared by the [place] FDA and has been authorized for detection and/or diagnosis of SARS-CoV-2 by FDA under an Emergency Use Authorization (EUA). This EUA will remain in effect (meaning this test can be used) for the duration of the COVID-19 declaration under Section 564(b)(1) of the Act, 21 U.S.C. section 360bbb-3(b)(1), unless the authorization is terminated or revoked.  Performed at [facility], [place] [address] [place], [place] [address]   Urine Culture     Status: Abnormal   Collection Time: 12/08/20  8:29 AM   Specimen: Urine, Clean Catch  Result Value Ref Range Status   Specimen Description   Final    URINE, CLEAN CATCH Performed at [facility], [place] [address], [place], [place] [address]     Special Requests   Final    NONE Performed at [facility], [place] [address], [place], [place] [address]    Culture (A)  Final    5,000 COLONIES/mL STREPTOCOCCUS AGALACTIAE TESTING AGAINST S. AGALACTIAE NOT ROUTINELY PERFORMED DUE TO PREDICTABILITY OF AMP/PEN/VAN SUSCEPTIBILITY. Performed at [facility], [place] [address] [place], [place] [address]    Report Status 12/09/2020 FINAL  Final  MRSA Next Gen by PCR, Nasal     Status: None   Collection Time: 12/08/20  9:09 AM   Specimen: Nasal Mucosa; Nasal Swab  Result Value Ref Range Status   MRSA by PCR Next Gen NOT DETECTED NOT DETECTED Final    Comment: (NOTE) The GeneXpert MRSA Assay (FDA approved for NASAL specimens only), is one component of a comprehensive MRSA colonization surveillance program. It is not intended to diagnose MRSA infection nor to guide or monitor treatment for MRSA infections. Test performance is not FDA approved in patients less than 28 years old. Performed at [facility], [place] [address], [place], [place] [address]     Procedures/Studies: DG Chest 2 View  Result Date: 12/07/2020 CLINICAL DATA:  Chest pain EXAM: CHEST - 2 VIEW COMPARISON:  10/06/2020 FINDINGS: The heart size and mediastinal contours are within normal limits. Both lungs are clear. The visualized skeletal structures are unremarkable. IMPRESSION: Normal study. Electronically Signed   By: Rolm Baptise M.D.   On: 12/07/2020 22:27   ECHOCARDIOGRAM COMPLETE  Result Date: 12/08/2020    ECHOCARDIOGRAM REPORT   Patient Name:   STORMEE DUDA [facility] Date of Exam: 12/08/2020 Medical Rec #:  [id number]                    Height:       63.0 in Accession #:    [id number]                   Weight:       126.0 lb Date of Birth:  1990/06/15                     BSA:          1.589 m Patient Age:    60 years  BP:           138/78 mmHg Patient Gender: F                            HR:           103 bpm.  Exam Location:  Inpatient Procedure: 2D Echo, Cardiac Doppler and Color Doppler Indications:    Chest pain  History:        Patient has prior history of Echocardiogram examinations, most                 recent 08/18/2019. Signs/Symptoms:Murmur, Shortness of Breath and                 Edema; Risk Factors:Diabetes.  Sonographer:    Luisa Hart RDCS Referring Phys: 67 Doreatha Lew [facility]  Sonographer Comments: Technically challenging study due to limited acoustic windows. Image acquisition challenging due to patient body habitus and Image acquisition challenging due to respiratory motion. IMPRESSIONS  1. Left ventricular ejection fraction, by estimation, is 60 to 65%. The left ventricle has normal function. The left ventricle has no regional wall motion abnormalities. Left ventricular diastolic parameters are consistent with Grade I diastolic dysfunction (impaired relaxation).  2. Right ventricular systolic function is normal. The right ventricular size is normal. Tricuspid regurgitation signal is inadequate for assessing PA pressure.  3. The mitral valve is grossly normal. No evidence of mitral valve regurgitation.  4. The aortic valve is tricuspid. Aortic valve regurgitation is not visualized.  5. The inferior vena cava is normal in size with greater than 50% respiratory variability, suggesting right atrial pressure of 3 mmHg. Comparison(s): No significant change from prior study. 08/18/2019: LVEF 60-65%. FINDINGS  Left Ventricle: Left ventricular ejection fraction, by estimation, is 60 to 65%. The left ventricle has normal function. The left ventricle has no regional wall motion abnormalities. The left ventricular internal cavity size was normal in size. There is  no left ventricular hypertrophy. Left ventricular diastolic parameters are consistent with Grade I diastolic dysfunction (impaired relaxation). Normal left ventricular filling pressure. Right Ventricle: The right ventricular size is normal. No increase in  right ventricular wall thickness. Right ventricular systolic function is normal. Tricuspid regurgitation signal is inadequate for assessing PA pressure. Left Atrium: Left atrial size was normal in size. Right Atrium: Right atrial size was normal in size. Pericardium: There is no evidence of pericardial effusion. Mitral Valve: The mitral valve is grossly normal. No evidence of mitral valve regurgitation. Tricuspid Valve: The tricuspid valve is grossly normal. Tricuspid valve regurgitation is trivial. Aortic Valve: The aortic valve is tricuspid. Aortic valve regurgitation is not visualized. Aortic valve mean gradient measures 4.0 mmHg. Aortic valve peak gradient measures 7.7 mmHg. Aortic valve area, by VTI measures 1.10 cm. Pulmonic Valve: The pulmonic valve was normal in structure. Pulmonic valve regurgitation is not visualized. Aorta: The aortic root and ascending aorta are structurally normal, with no evidence of dilitation. Venous: The inferior vena cava is normal in size with greater than 50% respiratory variability, suggesting right atrial pressure of 3 mmHg. IAS/Shunts: No atrial level shunt detected by color flow Doppler.  LEFT VENTRICLE PLAX 2D LVIDd:         3.50 cm     Diastology LVIDs:         2.70 cm     LV e' medial:    7.83 cm/s LV PW:         0.90 cm  LV E/e' medial:  8.2 LV IVS:        0.80 cm     LV e' lateral:   14.80 cm/s LVOT diam:     1.60 cm     LV E/e' lateral: 4.4 LV SV:         24 LV SV Index:   15 LVOT Area:     2.01 cm  LV Volumes (MOD) LV vol d, MOD A2C: 30.8 ml LV vol d, MOD A4C: 45.1 ml LV vol s, MOD A2C: 18.0 ml LV vol s, MOD A4C: 14.8 ml LV SV MOD A2C:     12.8 ml LV SV MOD A4C:     45.1 ml LV SV MOD BP:      21.1 ml RIGHT VENTRICLE RV S prime:     12.50 cm/s TAPSE (M-mode): 1.6 cm LEFT ATRIUM             Index      RIGHT ATRIUM          Index LA diam:        2.10 cm 1.32 cm/m RA Area:     6.90 cm LA Vol (A2C):   12.6 ml 7.93 ml/m RA Volume:   11.50 ml 7.24 ml/m LA Vol (A4C):    15.7 ml 9.88 ml/m LA Biplane Vol: 15.7 ml 9.88 ml/m  AORTIC VALVE                   PULMONIC VALVE AV Area (Vmax):    1.08 cm    PV Vmax:       1.09 m/s AV Area (Vmean):   1.07 cm    PV Vmean:      77.600 cm/s AV Area (VTI):     1.10 cm    PV VTI:        0.200 m AV Vmax:           139.00 cm/s PV Peak grad:  4.8 mmHg AV Vmean:          94.100 cm/s PV Mean grad:  3.0 mmHg AV VTI:            0.215 m AV Peak Grad:      7.7 mmHg AV Mean Grad:      4.0 mmHg LVOT Vmax:         74.40 cm/s LVOT Vmean:        50.300 cm/s LVOT VTI:          0.118 m LVOT/AV VTI ratio: 0.55  AORTA Ao Root diam: 2.60 cm MITRAL VALVE MV Area (PHT): 5.54 cm    SHUNTS MV Decel Time: 137 msec    Systemic VTI:  0.12 m MV E velocity: 64.50 cm/s  Systemic Diam: 1.60 cm MV A velocity: 78.20 cm/s MV E/A ratio:  0.82 Lyman Bishop MD Electronically signed by Lyman Bishop MD Signature Date/Time: 12/08/2020/12:57:34 PM    Final     Labs: BNP (last 3 results) No results for input(s): BNP in the last 8760 hours. Basic Metabolic Panel: Recent Labs  Lab 12/07/20 2302 12/08/20 0020 12/08/20 0417 12/08/20 0920 12/09/20 0544  NA 127* 131* 131* 136 135  K 4.2 5.0 4.1 3.5 3.5  CL 99 100 104 109 109  CO2 10* 11* 16* 21* 20*  GLUCOSE 765* 736* 506* 146* 256*  BUN 46* 42* 41* 38* 27*  CREATININE 2.69* 2.52* 2.34* 2.30* 1.77*  CALCIUM 9.5 9.6 9.2 9.2 8.5*   Liver Function Tests: Recent Labs  Lab 12/07/20 2302  AST 16  ALT 22  ALKPHOS 79  BILITOT 1.2  PROT 7.3  ALBUMIN 3.2*   Recent Labs  Lab 12/07/20 2302  LIPASE 21   No results for input(s): AMMONIA in the last 168 hours. CBC: Recent Labs  Lab 12/07/20 2234 12/08/20 0417 12/09/20 0544  WBC 13.2* 18.0* 11.4*  NEUTROABS 10.3*  --   --   HGB 11.8* 8.5* 8.6*  HCT 35.9* 25.2* 26.5*  MCV 80.9 79.5* 81.8  PLT 281 302 290   Cardiac Enzymes: No results for input(s): CKTOTAL, CKMB, CKMBINDEX, TROPONINI in the last 168 hours. BNP: Invalid input(s): POCBNP CBG: Recent  Labs  Lab 12/08/20 1353 12/08/20 1807 12/08/20 2007 12/09/20 0748 12/09/20 1210  GLUCAP 186* 308* 329* 274* 205*   D-Dimer No results for input(s): DDIMER in the last 72 hours. Hgb A1c Recent Labs    12/08/20 0417  HGBA1C 13.0*   Lipid Profile No results for input(s): CHOL, HDL, LDLCALC, TRIG, CHOLHDL, LDLDIRECT in the last 72 hours. Thyroid function studies No results for input(s): TSH, T4TOTAL, T3FREE, THYROIDAB in the last 72 hours.  Invalid input(s): FREET3 Anemia work up No results for input(s): VITAMINB12, FOLATE, FERRITIN, TIBC, IRON, RETICCTPCT in the last 72 hours. Urinalysis    Component Value Date/Time   COLORURINE YELLOW 12/08/2020 0829   APPEARANCEUR CLOUDY (A) 12/08/2020 0829   APPEARANCEUR Hazy 11/10/2013 2043   LABSPEC 1.017 12/08/2020 0829   LABSPEC 1.031 11/10/2013 2043   PHURINE 5.0 12/08/2020 0829   GLUCOSEU >=500 (A) 12/08/2020 0829   GLUCOSEU >=500 11/10/2013 2043   HGBUR MODERATE (A) 12/08/2020 0829   BILIRUBINUR NEGATIVE 12/08/2020 0829   BILIRUBINUR negative 06/04/2018 1035   BILIRUBINUR Negative 11/24/2015 1443   BILIRUBINUR Negative 11/10/2013 2043   KETONESUR 20 (A) 12/08/2020 0829   PROTEINUR 100 (A) 12/08/2020 0829   UROBILINOGEN 0.2 09/14/2019 1732   NITRITE NEGATIVE 12/08/2020 0829   LEUKOCYTESUR LARGE (A) 12/08/2020 0829   LEUKOCYTESUR 1+ 11/10/2013 2043   Sepsis Labs Invalid input(s): PROCALCITONIN,  WBC,  LACTICIDVEN Microbiology Recent Results (from the past 240 hour(s))  Resp Panel by RT-PCR (Flu A&B, Covid) Nasopharyngeal Swab     Status: None   Collection Time: 12/07/20 10:56 PM   Specimen: Nasopharyngeal Swab; Nasopharyngeal(NP) swabs in vial transport medium  Result Value Ref Range Status   SARS Coronavirus 2 by RT PCR NEGATIVE NEGATIVE Final    Comment: (NOTE) SARS-CoV-2 target nucleic acids are NOT DETECTED.  The SARS-CoV-2 RNA is generally detectable in upper respiratory specimens during the acute phase of  infection. The lowest concentration of SARS-CoV-2 viral copies this assay can detect is 138 copies/mL. A negative result does not preclude SARS-Cov-2 infection and should not be used as the sole basis for treatment or other patient management decisions. A negative result may occur with  improper specimen collection/handling, submission of specimen other than nasopharyngeal swab, presence of viral mutation(s) within the areas targeted by this assay, and inadequate number of viral copies(<138 copies/mL). A negative result must be combined with clinical observations, patient history, and epidemiological information. The expected result is Negative.  Fact Sheet for Patients:  EntrepreneurPulse.com.au  Fact Sheet for Healthcare Providers:  IncredibleEmployment.be  This test is no t yet approved or cleared by the [place] FDA and  has been authorized for detection and/or diagnosis of SARS-CoV-2 by FDA under an Emergency Use Authorization (EUA). This EUA will remain  in effect (meaning this test can be used) for the duration of  the COVID-19 declaration under Section 564(b)(1) of the Act, 21 U.S.C.section 360bbb-3(b)(1), unless the authorization is terminated  or revoked sooner.       Influenza A by PCR NEGATIVE NEGATIVE Final   Influenza B by PCR NEGATIVE NEGATIVE Final    Comment: (NOTE) The Xpert Xpress SARS-CoV-2/FLU/RSV plus assay is intended as an aid in the diagnosis of influenza from Nasopharyngeal swab specimens and should not be used as a sole basis for treatment. Nasal washings and aspirates are unacceptable for Xpert Xpress SARS-CoV-2/FLU/RSV testing.  Fact Sheet for Patients: EntrepreneurPulse.com.au  Fact Sheet for Healthcare Providers: IncredibleEmployment.be  This test is not yet approved or cleared by the [place] FDA and has been authorized for detection and/or diagnosis of SARS-CoV-2  by FDA under an Emergency Use Authorization (EUA). This EUA will remain in effect (meaning this test can be used) for the duration of the COVID-19 declaration under Section 564(b)(1) of the Act, 21 U.S.C. section 360bbb-3(b)(1), unless the authorization is terminated or revoked.  Performed at [facility], [place] [address] [place], [place] [address]   Urine Culture     Status: Abnormal   Collection Time: 12/08/20  8:29 AM   Specimen: Urine, Clean Catch  Result Value Ref Range Status   Specimen Description   Final    URINE, CLEAN CATCH Performed at [facility], [place] [address] [place], [place] [address]    Special Requests   Final    NONE Performed at [facility], [place] [address] [place], [place] [address]    Culture (A)  Final    5,000 COLONIES/mL STREPTOCOCCUS AGALACTIAE TESTING AGAINST S. AGALACTIAE NOT ROUTINELY PERFORMED DUE TO PREDICTABILITY OF AMP/PEN/VAN SUSCEPTIBILITY. Performed at [facility], [place] [address] [place], [place] [address]    Report Status 12/09/2020 FINAL  Final  MRSA Next Gen by PCR, Nasal     Status: None   Collection Time: 12/08/20  9:09 AM   Specimen: Nasal Mucosa; Nasal Swab  Result Value Ref Range Status   MRSA by PCR Next Gen NOT DETECTED NOT DETECTED Final    Comment: (NOTE) The GeneXpert MRSA Assay (FDA approved for NASAL specimens only), is one component of a comprehensive MRSA colonization surveillance program. It is not intended to diagnose MRSA infection nor to guide or monitor treatment for MRSA infections. Test performance is not FDA approved in patients less than 47 years old. Performed at [facility], [place] [address] [place],  [address]      Time coordinating discharge: 25 minutes  SIGNED: Antonieta Pert, MD  [facility] 12/09/2020, 1:31 PM  If 7PM-7AM, please contact night-coverage [email or web address]

## 2020-12-09 NOTE — Progress Notes (Signed)
Inpatient Diabetes Program Recommendations  AACE/ADA: New Consensus Statement on Inpatient Glycemic Control (2015)  Target Ranges:  Prepandial:   less than 140 mg/dL      Peak postprandial:   less than 180 mg/dL (1-2 hours)      Critically ill patients:  140 - 180 mg/dL   Lab Results  Component Value Date   GLUCAP 274 (H) 12/09/2020   HGBA1C 13.0 (H) 12/08/2020    Review of Glycemic Control  Current orders for Inpatient glycemic control:   Novolog 0-9 units TID with meals Novolog 5 units TID with meals  Inpatient Diabetes Program Recommendations:    Semglee 15 units QD (to start this morning) Add Novolog HS correction  Pt's family is supposed to bring pump this afternoon. This should not interfere with discharging pt. Have instructed to wait 24H to start basal insulin. Can use pump for correction and meal coverage. Pt to f/u with her social worker about issues with getting a Dexcom. Has Endo appt on 8/19. Answered questions. Pharmacy student speaking with pt about her experiences with Type 1 and other types of pumps she may be interested in.  Discussed above with RN and secure text sent to MD.  Thank you. Lorenda Peck, RD, LDN, CDE Inpatient Diabetes Coordinator [phone]

## 2020-12-16 ENCOUNTER — Ambulatory Visit (INDEPENDENT_AMBULATORY_CARE_PROVIDER_SITE_OTHER): Payer: Medicaid Other | Admitting: Internal Medicine

## 2020-12-16 ENCOUNTER — Encounter: Payer: Self-pay | Admitting: Internal Medicine

## 2020-12-16 VITALS — BP 128/86 | HR 88 | Ht 63.0 in | Wt 133.0 lb

## 2020-12-16 DIAGNOSIS — E1021 Type 1 diabetes mellitus with diabetic nephropathy: Secondary | ICD-10-CM | POA: Diagnosis not present

## 2020-12-16 DIAGNOSIS — E1065 Type 1 diabetes mellitus with hyperglycemia: Secondary | ICD-10-CM

## 2020-12-16 DIAGNOSIS — N1831 Chronic kidney disease, stage 3a: Secondary | ICD-10-CM

## 2020-12-16 MED ORDER — DEXCOM G6 TRANSMITTER MISC: 1.0000 | 3 refills | Status: DC

## 2020-12-16 MED ORDER — DEXCOM G6 SENSOR MISC: 1.0000 | 3 refills | Status: DC

## 2020-12-16 MED ORDER — OMNIPOD 5 DEXG7G6 PODS GEN 5 MISC
1.0000 | 3 refills | Status: DC
Start: 2020-12-16 — End: 2021-04-05

## 2020-12-16 MED ORDER — OMNIPOD 5 DEXG7G6 INTRO GEN 5 KIT
1.0000 | PACK | 0 refills | Status: DC
Start: 2020-12-16 — End: 2021-04-14

## 2020-12-16 NOTE — Progress Notes (Signed)
Name: Ashley Freeman  Age/ Sex: 30 y.o., female   MRN/ DOB: [id number], 1990/12/27     PCP: [name], PA-C   Reason for Endocrinology Evaluation: Type 2 Diabetes Mellitus  Initial Endocrine Consultative Visit: 06/27/18    PATIENT IDENTIFIER: Ashley Freeman is a 30 y.o. female with a past medical history of T1DM with multiple DKA's. The patient has followed with Endocrinology clinic since 06/27/2018 for consultative assistance with management of her diabetes.  DIABETIC HISTORY:  [name] was diagnosed with T1DM at age 80. Hx of multiple DKA's due to non-compliance. Her hemoglobin A1c has ranged from  in 11.3% in 2011, peaking at 19.3% in 2015.  S/P C-section 10/2019 ( [name])  SUBJECTIVE:   During the last visit (07/15/2020): A1c 9.4 % .  We adjusted pump settings   Today (12/16/2020): [name] is here for a follow up on diabetes management .  She checks glucose   She continues with non-compliance with glucose checks and medication adherence   She presented to the ED with DKA  at least monthly for the past 4 months  with the last one last week   She has been recently diagnosed with bipolar disorder and post-partum depression    Denies nausea or diarrhea   HOME DIABETES REGIMEN:   Novolog     This patient with type 1 diabetes is treated with Omnipod (insulin pump). During the visit the pump basal and bolus doses were reviewed including carb/insulin rations and supplemental doses. The clinical list was updated. The glucose meter download was reviewed in detail to determine if the current pump settings are providing the best glycemic control without excessive hypoglycemia.  Pump and meter download:    Pump   Omnipod Settings   Insulin type   NOVOLOG    Basal rate       0000 0.5   0800 0.6          I:C ratio       0000 12              AIT  4 hrs        Sensitivity       0000  65        Goal       0000  100             Type & Model of Pump: Omnipod Insulin Type: Currently using Novolog.  Body mass index is 23.56 kg/m.  PUMP STATISTICS: Average BG: 229 Average Daily Carbs (g):84 Average Total Daily Insulin: 14.6 Average Daily Basal: 9.9 u (78 %) Average Daily Bolus: 2.7(22%)     DIABETIC COMPLICATIONS: Microvascular complications:   Denies: neuropathy, retinopathy, CKD Last eye exam: Completed 02/2020    Macrovascular complications:    Denies: CAD, PVD, CVA  HISTORY:  Past Medical History:  Past Medical History:  Diagnosis Date   Abscess, gluteal, right 08/24/2013   AKI (acute kidney injury) ([place]) 07/26/2014   Anemia 02/19/2012   Bartholin's gland abscess 09/19/2013   BV (bacterial vaginosis) 11/24/2015   Diabetes mellitus type I ([place]) 2001   Diagnosed at age 35 ; Type I   Diarrhea 05/30/2016   DKA (diabetic ketoacidoses) 08/19/2013   Also in 2018   Gonorrhea 08/2011   Treated in 09/2011   History of trichomoniasis 05/31/2016   Hyperlipidemia 03/28/2016   Sepsis ([place]) 09/19/2013   Past Surgical History:  Past Surgical History:  Procedure  Laterality Date   CESAREAN SECTION N/A 10/05/2019   Procedure: CESAREAN SECTION;  Surgeon: Aletha Halim, MD;  Location: [facility];  Service: Obstetrics;  Laterality: N/A;   INCISION AND DRAINAGE ABSCESS Left 09/28/2019   Procedure: INCISION AND DRAINAGE VULVAR ABCESS;  Surgeon: [name], MD;  Location: [place];  Service: Gynecology;  Laterality: Left;   INCISION AND DRAINAGE PERIRECTAL ABSCESS Right 08/18/2013   Procedure: IRRIGATION AND DEBRIDEMENT GLUTEAL ABSCESS;  Surgeon: Ralene Ok, MD;  Location: [place];  Service: General;  Laterality: Right;   INCISION AND DRAINAGE PERIRECTAL ABSCESS Right 09/19/2013   Procedure: IRRIGATION AND DEBRIDEMENT RIGHT GLUTEAL AND LABIAL ABSCESSES;  Surgeon: Ralene Ok, MD;  Location: [place];  Service: General;  Laterality: Right;   INCISION AND DRAINAGE  PERIRECTAL ABSCESS Right 09/24/2013   Procedure: IRRIGATION AND DEBRIDEMENT PERIRECTAL ABSCESS;  Surgeon: Gwenyth Ober, MD;  Location: [place];  Service: General;  Laterality: Right;   Social History:  reports that she has never smoked. She has never used smokeless tobacco. She reports that she does not currently use alcohol. She reports that she does not use drugs. Family History:  Family History  Problem Relation Age of Onset   Asthma Mother    Carpal tunnel syndrome Mother    Gout Father    Diabetes Paternal Grandmother    Anesthesia problems Neg Hx      HOME MEDICATIONS: Allergies as of 12/16/2020       Reactions   Cephalexin Anaphylaxis   Penicillins Hives, Rash   Has patient had a PCN reaction causing immediate rash, facial/tongue/throat swelling, SOB or lightheadedness with hypotension: Yes Has patient had a PCN reaction causing severe rash involving mucus membranes or skin necrosis: No Has patient had a PCN reaction that required hospitalization: Yes Has patient had a PCN reaction occurring within the last 10 years: No Spoke with pt - childhood hives told by mom, tried no pcns since, doesn't remember reaction herself    Benadryl [diphenhydramine] Itching   Doxycycline Itching        Medication List        Accurate as of December 16, 2020  4:01 PM. If you have any questions, ask your nurse or doctor.          albuterol 108 (90 Base) MCG/ACT inhaler Commonly known as: VENTOLIN HFA Inhale 2 puffs into the lungs every 4 (four) hours as needed for wheezing or shortness of breath.   blood glucose meter kit and supplies Kit Dispense based on patient and insurance preference. Use up to four times daily as directed. (FOR ICD-9 250.00, 250.01).   Blood Pressure Kit Devi 1 Device by Does not apply route as needed.   cetirizine-pseudoephedrine 5-120 MG tablet Commonly known as: ZYRTEC-D Take 1 tablet by mouth every 12 (twelve) hours as needed for allergies.   Dexcom G6  Sensor Misc USE AS DIRECTED, CHANGE SENSOR EVERY 10 DAYS   Dexcom G6 Transmitter Misc Use to check blood sugar daily   gabapentin 300 MG capsule Commonly known as: NEURONTIN Take 300-900 mg by mouth at bedtime.   insulin aspart 100 UNIT/ML FlexPen Commonly known as: NOVOLOG 0-9 Units, Subcutaneous, 3 times daily with meals, CBG < 70: Implement Hypoglycemia measures CBG 70 - 120: 0 units CBG 121 - 150: 1 unit CBG 151 - 200: 2 units CBG 201 - 250: 3 units CBG 251 - 300: 5 units CBG 301 - 350: 7 units CBG 351 - 400: 9 units CBG >  400: call MD   lamoTRIgine 25 MG tablet Commonly known as: LAMICTAL Take 25 mg by mouth 2 (two) times daily.   Pentips 32G X 4 MM Misc Generic drug: Insulin Pen Needle Use as directed         OBJECTIVE:   PHYSICAL EXAM: VS:BP 128/86   Pulse 88   Ht 5' 3" (1.6 m)   Wt 133 lb (60.3 kg)   SpO2 96%   BMI 23.56 kg/m   BP 128/86   Pulse 88   Ht 5' 3" (1.6 m)   Wt 133 lb (60.3 kg)   SpO2 96%   BMI 23.56 kg/m    EXAM: General: Pt appears well and is in NAD  Lungs: Clear with good BS bilat with no rales, rhonchi, or wheezes  Heart: RRR  Abdomen:   soft and non-tender  Extremities: No pretibial edema   Mental Status: Judgment, insight: intact Mood and affect: no depression, anxiety, or agitation     DM foot exam: 07/15/2020 The skin of the feet is intact without sores or ulcerations. The pedal pulses are 2+ on right and 2+ on left. The sensation is intact to a screening 5.07, 10 gram monofilament bilaterally     DATA REVIEWED:  Lab Results  Component Value Date   HGBA1C 13.0 (H) 12/08/2020   HGBA1C 9.4 (A) 07/15/2020   HGBA1C 7.2 (A) 03/11/2020   Results for JESTINE, BICKNELL (MRN [id number]) as of 12/16/2020 16:01  Ref. Range 12/09/2020 05:44  Sodium Latest Ref Range: 135 - 145 mmol/L 135  Potassium Latest Ref Range: 3.5 - 5.1 mmol/L 3.5  Chloride Latest Ref Range: 98 - 111 mmol/L 109  CO2 Latest Ref Range: 22 - 32  mmol/L 20 (L)  Glucose Latest Ref Range: 70 - 99 mg/dL 256 (H)  BUN Latest Ref Range: 6 - 20 mg/dL 27 (H)  Creatinine Latest Ref Range: 0.44 - 1.00 mg/dL 1.77 (H)  Calcium Latest Ref Range: 8.9 - 10.3 mg/dL 8.5 (L)  Anion gap Latest Ref Range: 5 - 15  6  GFR, Estimated Latest Ref Range: >60 mL/min 40 (L)  Procalcitonin Latest Units: ng/mL 2.47    ASSESSMENT / PLAN / RECOMMENDATIONS:   1) Type 1 Diabetes Mellitus, Poorly  controlled, With CKD III - Most recent A1c of 13.0 %. Goal A1c < 6.5%.    - Poorly controlled diabetes due to medication non-adherence and dietary indiscretions. - In review of her pump download, the pt has not been bolusing or entering CHO at all, maybe twice a week at most . She is heavily dependent on her basal rate which I explained to her is NOT going to help with postprandial hyperglycemia  - Her PDM screen is broken, will switch to omnipod 5, will refer to [facility] for training on the new omnipod pump  - During hospitalization, one of the CDEs recommended T-slim but the pt has SEVERE fear of hypoglycemia and the Tslim is not going to be appropriate for her.   - I have again have tried to motivate her to take care of her health.  - She was recently diagnosed with postpartum and bipolar disorder, and I am hoping once this is controlled, her diabetes care will improve.  - I am going to increase basal rate but reduce her I:C ratio to prevent hypoglcemia and in the meantime she needs to learn to enter [facility]    MEDICATIONS: Novolog through the [place]  rate       0000-0800 0.50   0800- 0000 0.70          I:C ratio       0000 15          Sensitivity       0000  65      Goal       0000  110    EDUCATION / INSTRUCTIONS: BG monitoring instructions: Patient is instructed to check her blood sugars 4 times a day, before meals and bedtime  Call [facility] if: BG persistently < 70 I reviewed the Rule of 15 for the  treatment of hypoglycemia in detail with the patient. Literature supplied.   2) Diabetic complications:  Eye: Does not have known diabetic retinopathy.  Neuro/ Feet: Does not have known diabetic peripheral neuropathy. Renal: Patient does not have known baseline CKD. She is not on an ACEI/ARB at present.but has microalbuminuria, has a pending referral to nephrology per pt.    3) CKD III   - GFR continues to fluctuate , renal function deteriorated since pregnancy in 2021 . She missed her appointment with nephrology and is requesting a new referral  - New referral has been placed.   F/U in 4 months      Signed electronically by: Mack Guise, MD  [facility]  [facility] [address] [place], [place] [address] Phone: [phone] FAX: [phone]   CC: [name], PA-C [address] [place] [address] Phone: [phone]  Fax: [phone]  Return to Endocrinology clinic as below: No future appointments.

## 2020-12-23 ENCOUNTER — Encounter: Payer: Self-pay | Admitting: Internal Medicine

## 2020-12-25 ENCOUNTER — Ambulatory Visit (HOSPITAL_COMMUNITY): Payer: Self-pay

## 2021-01-08 ENCOUNTER — Ambulatory Visit: Payer: Self-pay

## 2021-01-16 ENCOUNTER — Other Ambulatory Visit (HOSPITAL_COMMUNITY): Payer: Self-pay

## 2021-01-16 ENCOUNTER — Telehealth: Payer: Self-pay | Admitting: Pharmacy Technician

## 2021-01-16 NOTE — Telephone Encounter (Signed)
Patient Advocate Encounter   Received notification from CoverMyMeds that prior authorization for Dexcom is required.   PA submitted on 01/16/21 Key [id number] Status is pending    [facility] will continue to follow:   Armanda Magic, CPhT Patient Advocate [facility] Phone: [phone] Fax:  [phone]

## 2021-01-16 NOTE — Telephone Encounter (Signed)
[facility] Patient Advocate Encounter  Prior Authorization for Dexcom has been approved.    Effective dates: 01/16/21 through 07/16/21

## 2021-01-30 ENCOUNTER — Other Ambulatory Visit: Payer: Self-pay

## 2021-01-30 MED ORDER — INSULIN ASPART 100 UNIT/ML FLEXPEN
PEN_INJECTOR | SUBCUTANEOUS | 0 refills | Status: DC
Start: 2021-01-30 — End: 2021-03-29

## 2021-01-30 NOTE — Telephone Encounter (Signed)
Refill sent.

## 2021-02-05 ENCOUNTER — Encounter: Payer: Self-pay | Admitting: Internal Medicine

## 2021-02-10 ENCOUNTER — Telehealth: Payer: Self-pay | Admitting: Internal Medicine

## 2021-02-10 DIAGNOSIS — E1065 Type 1 diabetes mellitus with hyperglycemia: Secondary | ICD-10-CM

## 2021-02-10 NOTE — Telephone Encounter (Signed)
Number keeps ringing busy. I Sent a [employer].

## 2021-02-10 NOTE — Telephone Encounter (Signed)
Pt requests a work note. Has been having low sugars lately causing her to miss work. Also, expresses SOB moments when walking around at work. Pt contact [phone]

## 2021-02-13 NOTE — Telephone Encounter (Signed)
Unable to reach patient at both numbers. VM left and mychart message sent as well

## 2021-03-07 ENCOUNTER — Inpatient Hospital Stay (HOSPITAL_COMMUNITY)
Admission: EM | Admit: 2021-03-07 | Discharge: 2021-03-09 | DRG: 919 | Disposition: A | Discharge status: Home / Self Care | Payer: Medicaid Other | Attending: Family Medicine | Admitting: Family Medicine

## 2021-03-07 ENCOUNTER — Emergency Department (HOSPITAL_COMMUNITY): Payer: Medicaid Other

## 2021-03-07 ENCOUNTER — Other Ambulatory Visit: Payer: Self-pay

## 2021-03-07 ENCOUNTER — Encounter (HOSPITAL_COMMUNITY): Payer: Self-pay

## 2021-03-07 DIAGNOSIS — E785 Hyperlipidemia, unspecified: Secondary | ICD-10-CM | POA: Diagnosis present

## 2021-03-07 DIAGNOSIS — D638 Anemia in other chronic diseases classified elsewhere: Secondary | ICD-10-CM | POA: Diagnosis present

## 2021-03-07 DIAGNOSIS — Z9641 Presence of insulin pump (external) (internal): Secondary | ICD-10-CM | POA: Diagnosis present

## 2021-03-07 DIAGNOSIS — E1010 Type 1 diabetes mellitus with ketoacidosis without coma: Secondary | ICD-10-CM | POA: Diagnosis present

## 2021-03-07 DIAGNOSIS — E860 Dehydration: Secondary | ICD-10-CM | POA: Diagnosis present

## 2021-03-07 DIAGNOSIS — R17 Unspecified jaundice: Secondary | ICD-10-CM | POA: Diagnosis present

## 2021-03-07 DIAGNOSIS — Z825 Family history of asthma and other chronic lower respiratory diseases: Secondary | ICD-10-CM

## 2021-03-07 DIAGNOSIS — Z79899 Other long term (current) drug therapy: Secondary | ICD-10-CM

## 2021-03-07 DIAGNOSIS — Z888 Allergy status to other drugs, medicaments and biological substances status: Secondary | ICD-10-CM

## 2021-03-07 DIAGNOSIS — T383X6A Underdosing of insulin and oral hypoglycemic [antidiabetic] drugs, initial encounter: Secondary | ICD-10-CM | POA: Diagnosis present

## 2021-03-07 DIAGNOSIS — E875 Hyperkalemia: Secondary | ICD-10-CM | POA: Diagnosis present

## 2021-03-07 DIAGNOSIS — D649 Anemia, unspecified: Secondary | ICD-10-CM | POA: Diagnosis present

## 2021-03-07 DIAGNOSIS — E1110 Type 2 diabetes mellitus with ketoacidosis without coma: Secondary | ICD-10-CM | POA: Diagnosis present

## 2021-03-07 DIAGNOSIS — N179 Acute kidney failure, unspecified: Secondary | ICD-10-CM | POA: Diagnosis not present

## 2021-03-07 DIAGNOSIS — Z20822 Contact with and (suspected) exposure to covid-19: Secondary | ICD-10-CM | POA: Diagnosis present

## 2021-03-07 DIAGNOSIS — N1831 Chronic kidney disease, stage 3a: Secondary | ICD-10-CM | POA: Diagnosis present

## 2021-03-07 DIAGNOSIS — T85624A Displacement of insulin pump, initial encounter: Principal | ICD-10-CM | POA: Diagnosis present

## 2021-03-07 DIAGNOSIS — Z794 Long term (current) use of insulin: Secondary | ICD-10-CM

## 2021-03-07 DIAGNOSIS — Z833 Family history of diabetes mellitus: Secondary | ICD-10-CM

## 2021-03-07 DIAGNOSIS — D72829 Elevated white blood cell count, unspecified: Secondary | ICD-10-CM

## 2021-03-07 DIAGNOSIS — Z880 Allergy status to penicillin: Secondary | ICD-10-CM

## 2021-03-07 DIAGNOSIS — Z881 Allergy status to other antibiotic agents status: Secondary | ICD-10-CM

## 2021-03-07 DIAGNOSIS — E1022 Type 1 diabetes mellitus with diabetic chronic kidney disease: Secondary | ICD-10-CM | POA: Diagnosis present

## 2021-03-07 DIAGNOSIS — E441 Mild protein-calorie malnutrition: Secondary | ICD-10-CM | POA: Diagnosis present

## 2021-03-07 LAB — CBC WITH DIFFERENTIAL/PLATELET
Abs Immature Granulocytes: 0.1 10*3/uL — ABNORMAL HIGH (ref 0.00–0.07)
Basophils Absolute: 0 10*3/uL (ref 0.0–0.1)
Basophils Relative: 0 %
Eosinophils Absolute: 0 10*3/uL (ref 0.0–0.5)
Eosinophils Relative: 0 %
HCT: 30.1 % — ABNORMAL LOW (ref 36.0–46.0)
Hemoglobin: 8.8 g/dL — ABNORMAL LOW (ref 12.0–15.0)
Immature Granulocytes: 1 %
Lymphocytes Relative: 10 %
Lymphs Abs: 1.5 10*3/uL (ref 0.7–4.0)
MCH: 26.7 pg (ref 26.0–34.0)
MCHC: 29.2 g/dL — ABNORMAL LOW (ref 30.0–36.0)
MCV: 91.2 fL (ref 80.0–100.0)
Monocytes Absolute: 0.7 10*3/uL (ref 0.1–1.0)
Monocytes Relative: 4 %
Neutro Abs: 12.9 10*3/uL — ABNORMAL HIGH (ref 1.7–7.7)
Neutrophils Relative %: 85 %
Platelets: 274 10*3/uL (ref 150–400)
RBC: 3.3 MIL/uL — ABNORMAL LOW (ref 3.87–5.11)
RDW: 14.5 % (ref 11.5–15.5)
WBC: 15.2 10*3/uL — ABNORMAL HIGH (ref 4.0–10.5)
nRBC: 0 % (ref 0.0–0.2)

## 2021-03-07 LAB — URINALYSIS, ROUTINE W REFLEX MICROSCOPIC
Bilirubin Urine: NEGATIVE
Glucose, UA: >=500 mg/dL — AB
Ketones, ur: 20 mg/dL — AB
Leukocytes,Ua: NEGATIVE
Nitrite: NEGATIVE
Protein, ur: 100 mg/dL — AB
Specific Gravity, Urine: 1.017 (ref 1.005–1.030)
pH: 5 (ref 5.0–8.0)

## 2021-03-07 LAB — BASIC METABOLIC PANEL
Anion gap: 13 (ref 5–15)
Anion gap: 11 (ref 5–15)
Anion gap: 6 (ref 5–15)
BUN: 59 mg/dL — ABNORMAL HIGH (ref 6–20)
BUN: 56 mg/dL — ABNORMAL HIGH (ref 6–20)
BUN: 51 mg/dL — ABNORMAL HIGH (ref 6–20)
BUN: 49 mg/dL — ABNORMAL HIGH (ref 6–20)
CO2: <7 mmol/L — ABNORMAL LOW (ref 22–32)
CO2: 14 mmol/L — ABNORMAL LOW (ref 22–32)
CO2: 18 mmol/L — ABNORMAL LOW (ref 22–32)
CO2: 21 mmol/L — ABNORMAL LOW (ref 22–32)
Calcium: 8.3 mg/dL — ABNORMAL LOW (ref 8.9–10.3)
Calcium: 8.4 mg/dL — ABNORMAL LOW (ref 8.9–10.3)
Calcium: 8.5 mg/dL — ABNORMAL LOW (ref 8.9–10.3)
Calcium: 8.4 mg/dL — ABNORMAL LOW (ref 8.9–10.3)
Chloride: 94 mmol/L — ABNORMAL LOW (ref 98–111)
Chloride: 100 mmol/L (ref 98–111)
Chloride: 103 mmol/L (ref 98–111)
Chloride: 107 mmol/L (ref 98–111)
Creatinine, Ser: 3.23 mg/dL — ABNORMAL HIGH (ref 0.44–1.00)
Creatinine, Ser: 2.87 mg/dL — ABNORMAL HIGH (ref 0.44–1.00)
Creatinine, Ser: 2.78 mg/dL — ABNORMAL HIGH (ref 0.44–1.00)
Creatinine, Ser: 2.59 mg/dL — ABNORMAL HIGH (ref 0.44–1.00)
GFR, Estimated: 19 mL/min — ABNORMAL LOW (ref >60)
GFR, Estimated: 22 mL/min — ABNORMAL LOW (ref >60)
GFR, Estimated: 23 mL/min — ABNORMAL LOW (ref >60)
GFR, Estimated: 25 mL/min — ABNORMAL LOW (ref >60)
Glucose, Bld: 1073 mg/dL (ref 70–99)
Glucose, Bld: 753 mg/dL (ref 70–99)
Glucose, Bld: 507 mg/dL (ref 70–99)
Glucose, Bld: 188 mg/dL — ABNORMAL HIGH (ref 70–99)
Potassium: 4.4 mmol/L (ref 3.5–5.1)
Potassium: 4.2 mmol/L (ref 3.5–5.1)
Potassium: 4 mmol/L (ref 3.5–5.1)
Potassium: 3.8 mmol/L (ref 3.5–5.1)
Sodium: 124 mmol/L — ABNORMAL LOW (ref 135–145)
Sodium: 127 mmol/L — ABNORMAL LOW (ref 135–145)
Sodium: 132 mmol/L — ABNORMAL LOW (ref 135–145)
Sodium: 134 mmol/L — ABNORMAL LOW (ref 135–145)

## 2021-03-07 LAB — BETA-HYDROXYBUTYRIC ACID
Beta-Hydroxybutyric Acid: >8 mmol/L — ABNORMAL HIGH (ref 0.05–0.27)
Beta-Hydroxybutyric Acid: 3.28 mmol/L — ABNORMAL HIGH (ref 0.05–0.27)
Beta-Hydroxybutyric Acid: 0.12 mmol/L (ref 0.05–0.27)

## 2021-03-07 LAB — COMPREHENSIVE METABOLIC PANEL
ALT: 24 U/L (ref 0–44)
AST: 26 U/L (ref 15–41)
Albumin: 3.4 g/dL — ABNORMAL LOW (ref 3.5–5.0)
Alkaline Phosphatase: 78 U/L (ref 38–126)
BUN: 58 mg/dL — ABNORMAL HIGH (ref 6–20)
CO2: <7 mmol/L — ABNORMAL LOW (ref 22–32)
Calcium: 8.5 mg/dL — ABNORMAL LOW (ref 8.9–10.3)
Chloride: 91 mmol/L — ABNORMAL LOW (ref 98–111)
Creatinine, Ser: 3.38 mg/dL — ABNORMAL HIGH (ref 0.44–1.00)
GFR, Estimated: 18 mL/min — ABNORMAL LOW (ref >60)
Glucose, Bld: 1230 mg/dL (ref 70–99)
Potassium: 5.7 mmol/L — ABNORMAL HIGH (ref 3.5–5.1)
Sodium: 120 mmol/L — ABNORMAL LOW (ref 135–145)
Total Bilirubin: 1.4 mg/dL — ABNORMAL HIGH (ref 0.3–1.2)
Total Protein: 6.6 g/dL (ref 6.5–8.1)

## 2021-03-07 LAB — GLUCOSE, CAPILLARY
Glucose-Capillary: 162 mg/dL — ABNORMAL HIGH (ref 70–99)
Glucose-Capillary: 171 mg/dL — ABNORMAL HIGH (ref 70–99)
Glucose-Capillary: 228 mg/dL — ABNORMAL HIGH (ref 70–99)
Glucose-Capillary: 290 mg/dL — ABNORMAL HIGH (ref 70–99)
Glucose-Capillary: 370 mg/dL — ABNORMAL HIGH (ref 70–99)
Glucose-Capillary: 417 mg/dL — ABNORMAL HIGH (ref 70–99)
Glucose-Capillary: 450 mg/dL — ABNORMAL HIGH (ref 70–99)
Glucose-Capillary: 494 mg/dL — ABNORMAL HIGH (ref 70–99)

## 2021-03-07 LAB — CBG MONITORING, ED
Glucose-Capillary: >600 mg/dL (ref 70–99)
Glucose-Capillary: >600 mg/dL (ref 70–99)
Glucose-Capillary: >600 mg/dL (ref 70–99)
Glucose-Capillary: >600 mg/dL (ref 70–99)
Glucose-Capillary: >600 mg/dL (ref 70–99)
Glucose-Capillary: >600 mg/dL (ref 70–99)
Glucose-Capillary: >600 mg/dL (ref 70–99)
Glucose-Capillary: >600 mg/dL (ref 70–99)
Glucose-Capillary: 591 mg/dL (ref 70–99)
Glucose-Capillary: >600 mg/dL (ref 70–99)
Glucose-Capillary: 571 mg/dL (ref 70–99)

## 2021-03-07 LAB — MRSA NEXT GEN BY PCR, NASAL: MRSA by PCR Next Gen: NOT DETECTED

## 2021-03-07 LAB — RESP PANEL BY RT-PCR (FLU A&B, COVID) ARPGX2
Influenza A by PCR: NEGATIVE
Influenza B by PCR: NEGATIVE
SARS Coronavirus 2 by RT PCR: NEGATIVE

## 2021-03-07 LAB — I-STAT BETA HCG BLOOD, ED (MC, WL, AP ONLY): I-stat hCG, quantitative: <5 m[IU]/mL (ref <5)

## 2021-03-07 LAB — PHOSPHORUS: Phosphorus: 3.6 mg/dL (ref 2.5–4.6)

## 2021-03-07 LAB — BLOOD GAS, VENOUS
Acid-base deficit: 22.7 mmol/L — ABNORMAL HIGH (ref 0.0–2.0)
Bicarbonate: 5.2 mmol/L — ABNORMAL LOW (ref 20.0–28.0)
O2 Saturation: 97.2 %
Patient temperature: 98.6
pCO2, Ven: <19 mmHg — CL (ref 44.0–60.0)
pH, Ven: 7.132 — CL (ref 7.250–7.430)
pO2, Ven: 113 mmHg — ABNORMAL HIGH (ref 32.0–45.0)

## 2021-03-07 LAB — LIPASE, BLOOD: Lipase: 23 U/L (ref 11–51)

## 2021-03-07 LAB — MAGNESIUM: Magnesium: 1.9 mg/dL (ref 1.7–2.4)

## 2021-03-07 MED ORDER — LACTATED RINGERS IV BOLUS: 1000.0000 mL | Freq: Once | INTRAVENOUS | Status: DC

## 2021-03-07 MED ORDER — DEXTROSE IN LACTATED RINGERS 5 % IV SOLN: INTRAVENOUS | Status: DC

## 2021-03-07 MED ORDER — DEXTROSE 50 % IV SOLN: 0.0000 mL | INTRAVENOUS | Status: DC | PRN

## 2021-03-07 MED ORDER — LACTATED RINGERS IV BOLUS
1500.0000 mL | Freq: Once | INTRAVENOUS | Status: AC
  Administered 2021-03-07: 1500 mL via INTRAVENOUS

## 2021-03-07 MED ORDER — INSULIN REGULAR BOLUS VIA INFUSION
10.0000 [IU] | Freq: Once | INTRAVENOUS | Status: DC
  Filled 2021-03-07: qty 10

## 2021-03-07 MED ORDER — SODIUM CHLORIDE 0.9 % IV BOLUS: 2000.0000 mL | Freq: Once | INTRAVENOUS | Status: DC

## 2021-03-07 MED ORDER — ACETAMINOPHEN 650 MG RE SUPP: 650.0000 mg | Freq: Four times a day (QID) | RECTAL | Status: DC | PRN

## 2021-03-07 MED ORDER — LACTATED RINGERS IV BOLUS
1000.0000 mL | Freq: Once | INTRAVENOUS | Status: AC
  Administered 2021-03-07: 1000 mL via INTRAVENOUS

## 2021-03-07 MED ORDER — ONDANSETRON HCL 4 MG/2ML IJ SOLN
4.0000 mg | Freq: Once | INTRAMUSCULAR | Status: AC
  Administered 2021-03-07: 4 mg via INTRAVENOUS
  Filled 2021-03-07: qty 2

## 2021-03-07 MED ORDER — INSULIN ASPART 100 UNIT/ML IJ SOLN
10.0000 [IU] | Freq: Once | INTRAMUSCULAR | Status: AC
  Administered 2021-03-07: 10 [IU] via INTRAVENOUS
  Filled 2021-03-07: qty 0.1

## 2021-03-07 MED ORDER — ENOXAPARIN SODIUM 40 MG/0.4ML IJ SOSY: 40.0000 mg | PREFILLED_SYRINGE | INTRAMUSCULAR | Status: DC

## 2021-03-07 MED ORDER — LACTATED RINGERS IV SOLN: INTRAVENOUS | Status: DC

## 2021-03-07 MED ORDER — INSULIN REGULAR(HUMAN) IN NACL 100-0.9 UT/100ML-% IV SOLN
INTRAVENOUS | Status: DC
  Administered 2021-03-07: 5 [IU]/h via INTRAVENOUS
  Filled 2021-03-07: qty 100

## 2021-03-07 MED ORDER — CHLORHEXIDINE GLUCONATE CLOTH 2 % EX PADS
6.0000 | MEDICATED_PAD | Freq: Every day | CUTANEOUS | Status: DC
  Administered 2021-03-07 – 2021-03-08 (×2): 6 via TOPICAL

## 2021-03-07 MED ORDER — ACETAMINOPHEN 325 MG PO TABS
650.0000 mg | ORAL_TABLET | Freq: Four times a day (QID) | ORAL | Status: DC | PRN
  Administered 2021-03-07: 650 mg via ORAL
  Filled 2021-03-07: qty 2

## 2021-03-07 NOTE — ED Notes (Signed)
Ph 7.132  Pco2 <19.0  Critical Results reported to MD Kommor

## 2021-03-07 NOTE — ED Provider Notes (Addendum)
Emergency Medicine Provider Triage Evaluation Note  Ashley Freeman , a 30 y.o. female  was evaluated in triage.  Pt complains of possible DKA> Her Dexacen has not been working. Feels similar to her prior DKA episodes. Multiple admissions for similar. Generalized weakness, NBNB emesis, some SOB. Lightheaded with ambulation. No pain, hx of DVT, PE. Type 1 DM. Unsure what her CBGs are currently, has not checked recently. States normally "good" however cannot tell me what "good" is. Some polyuria without polydipsia. Not eating and drinking at home due to gen weakness.  Review of Systems  Positive: Weakness, emesis, SOB Negative: Fever, pain, diarrhea  Physical Exam  BP 126/73 (BP Location: Left Arm)   Pulse (!) 112   Temp 98.2 F (36.8 C) (Oral)   Resp 18   SpO2 100%  Gen:   Awake, no distress   Resp:  Normal effort  Heart:  Clear, tachycardic MSK:   Moves extremities without difficulty  Other:    Medical Decision Making  Medically screening exam initiated at 9:19 AM.  Appropriate orders placed.  Ashley Freeman was informed that the remainder of the evaluation will be completed by another provider, this initial triage assessment does not replace that evaluation, and the importance of remaining in the ED until their evaluation is complete.  Weakness suspect DKA given Hx. Tachycardic however denies pain to suggest PE  CBG "HIGH" in triage  Will need room in back     Calli Bashor A, PA-C 03/07/21 [place], MD 03/07/21 1203

## 2021-03-07 NOTE — Plan of Care (Signed)
  Problem: Education: Goal: Ability to describe self-care measures that may prevent or decrease complications (Diabetes Survival Skills Education) will improve Outcome: Progressing   Problem: Health Behavior/Discharge Planning: Goal: Ability to manage health-related needs will improve Outcome: Progressing   Problem: Metabolic: Goal: Ability to maintain appropriate glucose levels will improve Outcome: Progressing

## 2021-03-07 NOTE — Progress Notes (Signed)
Inpatient Diabetes Program Recommendations  AACE/ADA: New Consensus Statement on Inpatient Glycemic Control (2015)  Target Ranges:  Prepandial:   less than 140 mg/dL      Peak postprandial:   less than 180 mg/dL (1-2 hours)      Critically ill patients:  140 - 180 mg/dL   Lab Results  Component Value Date   GLUCAP >600 (HH) 03/07/2021   HGBA1C 13.0 (H) 12/08/2020    Review of Glycemic Control  Diabetes history: DM1 Outpatient Diabetes medications: OmniPod, Novolog 0-9 units TID Current orders for Inpatient glycemic control: IV insulin per [facility] for DKA  HgbA1C - 13.0% (12/08/20)  Inpatient Diabetes Program Recommendations:    Need updated HgbA1C  Will speak with pt on 11/09 regarding her diabetes control PTA.  Thank you. Lorenda Peck, RD, LDN, CDE Inpatient Diabetes Coordinator [phone]

## 2021-03-07 NOTE — ED Provider Notes (Signed)
[facility] DEPT Provider Note   CSN: [id number] Arrival date & time: 03/07/21  0908     History Chief Complaint  Patient presents with   Hyperglycemia    Ashley Freeman is a 30 y.o. female with PMH type 1 diabetes with multiple previous admissions for DKA who presents the emergency department for evaluation of nausea, vomiting and chest pain.  Patient states that symptoms began this morning and have been getting worse.  She has a Dexcom that she fears has been malfunctioning but states she has been compliant with her insulin therapy.  Denies fever, cough, abdominal pain or other systemic symptoms.  She arrives with nausea and vomiting.   Hyperglycemia Associated symptoms: chest pain, nausea and vomiting   Associated symptoms: no abdominal pain, no dysuria, no fever and no shortness of breath       Past Medical History:  Diagnosis Date   Abscess, gluteal, right 08/24/2013   AKI (acute kidney injury) ([place]) 07/26/2014   Anemia 02/19/2012   Bartholin's gland abscess 09/19/2013   BV (bacterial vaginosis) 11/24/2015   Diabetes mellitus type I ([place]) 2001   Diagnosed at age 63 ; Type I   Diarrhea 05/30/2016   DKA (diabetic ketoacidoses) 08/19/2013   Also in 2018   Gonorrhea 08/2011   Treated in 09/2011   History of trichomoniasis 05/31/2016   Hyperlipidemia 03/28/2016   Sepsis ([place]) 09/19/2013    Patient Active Problem List   Diagnosis Date Noted   DKA, type 1 ([place]) 12/08/2020   AKI (acute kidney injury) ([place]) 09/17/2020   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 ([place]) 09/17/2020   DKA (diabetic ketoacidosis) ([place]) 09/16/2020   Type 1 diabetes mellitus with stage 3a chronic kidney disease ([place]) 07/18/2020   History of pre-eclampsia 10/05/2019   Transaminitis 10/05/2019   Elevated serum creatinine 10/05/2019   Bilateral leg edema 07/37/1062   Systolic ejection murmur 69/48/5462   Shortness of breath 08/03/2019   Type 1 diabetes  mellitus with hyperglycemia ([place]) 07/24/2019   Diabetic retinopathy ([place]) 07/02/2019   Proteinuria due to type 1 diabetes mellitus ([place]) 05/21/2019   Diabetic neuropathy ([place]) 01/16/2016   Diabetes mellitus type 1, uncontrolled, with complications 70/35/0093    Past Surgical History:  Procedure Laterality Date   CESAREAN SECTION N/A 10/05/2019   Procedure: CESAREAN SECTION;  Surgeon: Aletha Halim, MD;  [facility];  Service: Obstetrics;  Laterality: N/A;   INCISION AND DRAINAGE ABSCESS Left 09/28/2019   Procedure: INCISION AND DRAINAGE VULVAR ABCESS;  Surgeon: [name], MD;  Location: [facility];  Service: Gynecology;  Laterality: Left;   INCISION AND DRAINAGE PERIRECTAL ABSCESS Right 08/18/2013   Procedure: IRRIGATION AND DEBRIDEMENT GLUTEAL ABSCESS;  Surgeon: Ralene Ok, MD;  Location: [facility];  Service: General;  Laterality: Right;   INCISION AND DRAINAGE PERIRECTAL ABSCESS Right 09/19/2013   Procedure: IRRIGATION AND DEBRIDEMENT RIGHT GLUTEAL AND LABIAL ABSCESSES;  Surgeon: Ralene Ok, MD;  Location: [facility];  Service: General;  Laterality: Right;   INCISION AND DRAINAGE PERIRECTAL ABSCESS Right 09/24/2013   Procedure: IRRIGATION AND DEBRIDEMENT PERIRECTAL ABSCESS;  Surgeon: Gwenyth Ober, MD;  Location: [facility];  Service: General;  Laterality: Right;     OB History     Gravida  2   Para  1   Term      Preterm  1   AB  1   Living  1      SAB  1   IAB  Ectopic      Multiple  0   Live Births  1           Family History  Problem Relation Age of Onset   Asthma Mother    Carpal tunnel syndrome Mother    Gout Father    Diabetes Paternal Grandmother    Anesthesia problems Neg Hx     Social History   Tobacco Use   Smoking status: Never   Smokeless tobacco: Never  Vaping Use   Vaping Use: Never used  Substance Use Topics   Alcohol use: Not Currently   Drug use: No    Home Medications Prior to Admission medications   Medication Sig  Start Date End Date Taking? Authorizing Provider  albuterol (VENTOLIN HFA) 108 (90 Base) MCG/ACT inhaler Inhale 2 puffs into the lungs every 4 (four) hours as needed for wheezing or shortness of breath. 08/09/20   [provider]  blood glucose meter kit and supplies KIT Dispense based on patient and insurance preference. Use up to four times daily as directed. (FOR ICD-9 250.00, 250.01). 05/25/17   Isla Pence, MD  Blood Pressure Monitoring (BLOOD PRESSURE KIT) DEVI 1 Device by Does not apply route as needed. 05/06/19   Anyanwu, Sallyanne Havers, MD  cetirizine-pseudoephedrine (ZYRTEC-D) 5-120 MG tablet Take 1 tablet by mouth every 12 (twelve) hours as needed for allergies. 08/09/20   [provider]  Continuous Blood Gluc Sensor (DEXCOM G6 SENSOR) MISC Inject 1 Device into the skin as directed. 12/16/20   Shamleffer, Melanie Crazier, MD  Continuous Blood Gluc Transmit (DEXCOM G6 TRANSMITTER) MISC Inject 1 Device into the skin as directed. Use to check blood sugar daily 12/16/20   Shamleffer, Melanie Crazier, MD  gabapentin (NEURONTIN) 300 MG capsule Take 300-900 mg by mouth at bedtime. 12/02/20   [provider]  insulin aspart (NOVOLOG) 100 UNIT/ML FlexPen 0-9 Units, Subcutaneous, 3 times daily with meals, CBG < 70: Implement Hypoglycemia measures CBG 70 - 120: 0 units CBG 121 - 150: 1 unit CBG 151 - 200: 2 units CBG 201 - 250: 3 units CBG 251 - 300: 5 units CBG 301 - 350: 7 units CBG 351 - 400: 9 units CBG > 400: call MD 01/30/21   Shamleffer, Melanie Crazier, MD  Insulin Disposable Pump (OMNIPOD 5 G6 INTRO, GEN 5,) KIT 1 Device by Does not apply route every 3 (three) days. 12/16/20   Shamleffer, Melanie Crazier, MD  Insulin Disposable Pump (OMNIPOD 5 G6 POD, GEN 5,) MISC 1 Device by Does not apply route every 3 (three) days. 12/16/20   Shamleffer, Melanie Crazier, MD  Insulin Pen Needle 32G X 4 MM MISC Use as directed 10/07/20   Ghimire, Henreitta Leber, MD  lamoTRIgine (LAMICTAL) 25 MG tablet  Take 25 mg by mouth 2 (two) times daily. 12/03/20   [provider]  Ferrous Sulfate (IRON) 325 (65 Fe) MG TABS Take 1 tablet (325 mg total) by mouth every other day. Patient not taking: Reported on 07/25/2020 06/17/19 07/25/20  Chauncey Mann, MD  hydrochlorothiazide (HYDRODIURIL) 25 MG tablet Take 1 tablet (25 mg total) by mouth daily. Patient not taking: No sig reported 10/08/19 07/25/20  Constant, Peggy, MD  promethazine (PHENERGAN) 25 MG tablet Take 1 tablet (25 mg total) by mouth every 8 (eight) hours as needed for nausea or vomiting (if zofran fails). Patient not taking: Reported on 10/06/2020 08/25/20 10/06/20  Varney Biles, MD    Allergies    Cephalexin, Penicillins, Benadryl [diphenhydramine], and  Doxycycline  Review of Systems   Review of Systems  Constitutional:  Negative for chills and fever.  HENT:  Negative for ear pain and sore throat.   Eyes:  Negative for pain and visual disturbance.  Respiratory:  Negative for cough and shortness of breath.   Cardiovascular:  Positive for chest pain. Negative for palpitations.  Gastrointestinal:  Positive for nausea and vomiting. Negative for abdominal pain.  Genitourinary:  Negative for dysuria and hematuria.  Musculoskeletal:  Negative for arthralgias and back pain.  Skin:  Negative for color change and rash.  Neurological:  Negative for seizures and syncope.  All other systems reviewed and are negative.  Physical Exam Updated Vital Signs BP 138/73   Pulse (!) 118   Temp 98.2 F (36.8 C) (Oral)   Resp 20   SpO2 100%   Physical Exam Vitals and nursing note reviewed.  Constitutional:      General: She is not in acute distress.    Appearance: She is well-developed. She is ill-appearing.  HENT:     Head: Normocephalic and atraumatic.  Eyes:     Conjunctiva/sclera: Conjunctivae normal.  Cardiovascular:     Rate and Rhythm: Regular rhythm. Tachycardia present.     Heart sounds: No murmur heard. Pulmonary:     Effort:  Pulmonary effort is normal. No respiratory distress.     Breath sounds: Normal breath sounds.  Abdominal:     Palpations: Abdomen is soft.     Tenderness: There is abdominal tenderness.  Musculoskeletal:     Cervical back: Neck supple.  Skin:    General: Skin is warm and dry.  Neurological:     Mental Status: She is alert.    ED Results / Procedures / Treatments   Labs (all labs ordered are listed, but only abnormal results are displayed) Labs Reviewed  CBC WITH DIFFERENTIAL/PLATELET - Abnormal; Notable for the following components:      Result Value   WBC 15.2 (*)    RBC 3.30 (*)    Hemoglobin 8.8 (*)    HCT 30.1 (*)    MCHC 29.2 (*)    Neutro Abs 12.9 (*)    Abs Immature Granulocytes 0.10 (*)    All other components within normal limits  BLOOD GAS, VENOUS - Abnormal; Notable for the following components:   pH, Ven 7.132 (*)    pCO2, Ven <19.0 (*)    pO2, Ven 113.0 (*)    Bicarbonate 5.2 (*)    Acid-base deficit 22.7 (*)    All other components within normal limits  CBG MONITORING, ED - Abnormal; Notable for the following components:   Glucose-Capillary >600 (*)    All other components within normal limits  RESP PANEL BY RT-PCR (FLU A&B, COVID) ARPGX2  COMPREHENSIVE METABOLIC PANEL  LIPASE, BLOOD  URINALYSIS, ROUTINE W REFLEX MICROSCOPIC  BETA-HYDROXYBUTYRIC ACID  I-STAT BETA HCG BLOOD, ED ([facility], [facility], [facility] ONLY)    EKG None  Radiology DG Chest Portable 1 View  Result Date: 03/07/2021 CLINICAL DATA:  Chest pain. EXAM: PORTABLE CHEST 1 VIEW COMPARISON:  December 07, 2020. FINDINGS: The heart size and mediastinal contours are within normal limits. Both lungs are clear. The visualized skeletal structures are unremarkable. IMPRESSION: No active disease. Electronically Signed   By: Marijo Conception M.D.   On: 03/07/2021 10:28    Procedures .Critical Care Performed by: Teressa Lower, MD Authorized by: Teressa Lower, MD   Critical care provider statement:    Critical  care time (minutes):  80   Critical care was necessary to treat or prevent imminent or life-threatening deterioration of the following conditions:  Metabolic crisis   Critical care was time spent personally by me on the following activities:  Blood draw for specimens, ordering and performing treatments and interventions, ordering and review of laboratory studies, development of treatment plan with patient or surrogate, ordering and review of radiographic studies, pulse oximetry, re-evaluation of patient's condition and review of old charts   Medications Ordered in ED Medications  lactated ringers bolus 1,500 mL (1,500 mLs Intravenous New Bag/Given 03/07/21 1004)  ondansetron (ZOFRAN) injection 4 mg (4 mg Intravenous Given 03/07/21 1039)    ED Course  I have reviewed the triage vital signs and the nursing notes.  Pertinent labs & imaging results that were available during my care of the patient were reviewed by me and considered in my medical decision making (see chart for details).    MDM Rules/Calculators/A&P                           Patient seen emergency department for evaluation of nausea vomiting and chest pain with concern for DKA.  Physical exam reveals an ill-appearing tachycardic patient with generalized abdominal tenderness.  Laboratory evaluation reveals an initial blood sugar greater than 600 on POC glucose.  I-STAT blood gas with pH 7.13.  CBC with a leukocytosis to 15.2, hemoglobin 8.8.  CMP with pseudohyponatremia to 120, potassium 5.7, hypochloremia to 91, undetectable bicarb, glucose elevated to 1230, BUN 58, creatinine elevated at 3.38 which is a elevation for this patient.  Pregnancy test negative, COVID and flu negative.  Beta hydroxybutyrate greater than 8.  Patient given a bolus of 10 units regular insulin and started on insulin drip.  Fluid resuscitation begun with lactated Ringer's.  Patient will require admission to the hospital for DKA. Final Clinical Impression(s) / ED  Diagnoses Final diagnoses:  None    Rx / DC Orders ED Discharge Orders     None        , , MD 03/07/21 1125

## 2021-03-07 NOTE — ED Notes (Signed)
Pt up to bedside commode to urinate. Able to get in and out of bed independently

## 2021-03-07 NOTE — ED Notes (Signed)
Glucose 1230  Reported to [facility]

## 2021-03-07 NOTE — H&P (Signed)
History and Physical    Ashley Freeman AJO:[id number] DOB: 1991/02/13 DOA: 03/07/2021  PCP: [name], PA-C  Patient coming from: Home.   I have personally briefly reviewed patient's old medical records in [place]  Chief Complaint: DKA.  HPI: Ashley Freeman is a 30 y.o. female with medical history significant of right gluteal abscess, chronic disease anemia, Bartholin's glands abscess, bacterial vaginosis, history of gonorrhea, history of trichomoniasis, type I DM, multiple episodes of DKA, hyperlipidemia, history of sepsis, stage 3a CKD who came into the emergency department via private operated vehicle with concerns for DKA.[name]  Patient stated that after she woke up she has been dyspneic, has abdominal pain and vomited several times.  She had polydipsia last night.  No diarrhea, constipation, melena or hematochezia.  She denied fever, chills, sore throat, rhinorrhea, productive cough, wheezing or hemoptysis.  No chest pain, palpitations, diaphoresis, PND, orthopnea or pitting edema lower extremities.  ED Course: Initial vital signs were temperature Initial vital signs were temperature 98.2 F, pulse 112, respiration 18, BP 126/73 mmHg and O2 sat 100% on room air.  The patient received 1500 mL of LR bolus, ondansetron 4 mg IVP, NovoLog 10 units IV x1 followed by an insulin infusion per Endo tool.  Lab work: CBC is her white count of 15.2, hemoglobin 8.8 g/dL and platelets 274.  Venous blood gas showed a pH of 7.132, PCO2 less than 19.0 and PO2 of 113.0 mmHg.  Bicarbonate was 5.2 and acid base deficit 22.7 mmol/L.  BHA was more than 8.00 mmol/L.  Sodium 128, potassium 5.7, chloride 91 and CO2 less than 7 mmol/L.  Bilirubin was 1.4, glucose 1230, BUN 50 a and creatinine 3.38 mg/dL.  Albumin was 3.4 g/dL.  The rest of the hepatic functions are unremarkable.  Imaging: Portable 1 view chest radiograph did not show any active disease.  Please see image and full  radiology report for further details.  Review of Systems: As per HPI otherwise all other systems reviewed and are negative.  Past Medical History:  Diagnosis Date   Abscess, gluteal, right 08/24/2013   AKI (acute kidney injury) ([place]) 07/26/2014   Anemia 02/19/2012   Bartholin's gland abscess 09/19/2013   BV (bacterial vaginosis) 11/24/2015   Diabetes mellitus type I ([place]) 2001   Diagnosed at age 33 ; Type I   Diarrhea 05/30/2016   DKA (diabetic ketoacidoses) 08/19/2013   Also in 2018   Gonorrhea 08/2011   Treated in 09/2011   History of trichomoniasis 05/31/2016   Hyperlipidemia 03/28/2016   Sepsis ([place]) 09/19/2013   Past Surgical History:  Procedure Laterality Date   CESAREAN SECTION N/A 10/05/2019   Procedure: CESAREAN SECTION;  Surgeon: Aletha Halim, MD;  [facility];  Service: Obstetrics;  Laterality: N/A;   INCISION AND DRAINAGE ABSCESS Left 09/28/2019   Procedure: INCISION AND DRAINAGE VULVAR ABCESS;  Surgeon: [name], MD;  Location: [facility];  Service: Gynecology;  Laterality: Left;   INCISION AND DRAINAGE PERIRECTAL ABSCESS Right 08/18/2013   Procedure: IRRIGATION AND DEBRIDEMENT GLUTEAL ABSCESS;  Surgeon: Ralene Ok, MD;  Location: [facility];  Service: General;  Laterality: Right;   INCISION AND DRAINAGE PERIRECTAL ABSCESS Right 09/19/2013   Procedure: IRRIGATION AND DEBRIDEMENT RIGHT GLUTEAL AND LABIAL ABSCESSES;  Surgeon: Ralene Ok, MD;  Location: [place] [facility];  Service: General;  Laterality: Right;   INCISION AND DRAINAGE PERIRECTAL ABSCESS Right 09/24/2013   Procedure: IRRIGATION AND DEBRIDEMENT PERIRECTAL ABSCESS;  Surgeon: Gwenyth Ober, MD;  [facility];  Service: General;  Laterality: Right;   Social History  reports that she has never smoked. She has never used smokeless tobacco. She reports that she does not currently use alcohol. She reports that she does not use drugs.  Allergies  Allergen Reactions   Cephalexin Anaphylaxis   Penicillins Hives and  Rash    Has patient had a PCN reaction causing immediate rash, facial/tongue/throat swelling, SOB or lightheadedness with hypotension: Yes Has patient had a PCN reaction causing severe rash involving mucus membranes or skin necrosis: No Has patient had a PCN reaction that required hospitalization: Yes Has patient had a PCN reaction occurring within the last 10 years: No Spoke with pt - childhood hives told by mom, tried no pcns since, doesn't remember reaction herself    Benadryl [Diphenhydramine] Itching   Doxycycline Itching   Family History  Problem Relation Age of Onset   Asthma Mother    Carpal tunnel syndrome Mother    Gout Father    Diabetes Paternal Grandmother    Anesthesia problems Neg Hx    Prior to Admission medications   Medication Sig Start Date End Date Taking? Authorizing Provider  albuterol (VENTOLIN HFA) 108 (90 Base) MCG/ACT inhaler Inhale 2 puffs into the lungs every 4 (four) hours as needed for wheezing or shortness of breath. 08/09/20   [provider]  blood glucose meter kit and supplies KIT Dispense based on patient and insurance preference. Use up to four times daily as directed. (FOR ICD-9 250.00, 250.01). 05/25/17   Isla Pence, MD  Blood Pressure Monitoring (BLOOD PRESSURE KIT) DEVI 1 Device by Does not apply route as needed. 05/06/19   Anyanwu, Sallyanne Havers, MD  cetirizine-pseudoephedrine (ZYRTEC-D) 5-120 MG tablet Take 1 tablet by mouth every 12 (twelve) hours as needed for allergies. 08/09/20   [provider]  Continuous Blood Gluc Sensor (DEXCOM G6 SENSOR) MISC Inject 1 Device into the skin as directed. 12/16/20   Shamleffer, Melanie Crazier, MD  Continuous Blood Gluc Transmit (DEXCOM G6 TRANSMITTER) MISC Inject 1 Device into the skin as directed. Use to check blood sugar daily 12/16/20   Shamleffer, Melanie Crazier, MD  gabapentin (NEURONTIN) 300 MG capsule Take 300-900 mg by mouth at bedtime. 12/02/20   [provider]  insulin aspart  (NOVOLOG) 100 UNIT/ML FlexPen 0-9 Units, Subcutaneous, 3 times daily with meals, CBG < 70: Implement Hypoglycemia measures CBG 70 - 120: 0 units CBG 121 - 150: 1 unit CBG 151 - 200: 2 units CBG 201 - 250: 3 units CBG 251 - 300: 5 units CBG 301 - 350: 7 units CBG 351 - 400: 9 units CBG > 400: call MD 01/30/21   Shamleffer, Melanie Crazier, MD  Insulin Disposable Pump (OMNIPOD 5 G6 INTRO, GEN 5,) KIT 1 Device by Does not apply route every 3 (three) days. 12/16/20   Shamleffer, Melanie Crazier, MD  Insulin Disposable Pump (OMNIPOD 5 G6 POD, GEN 5,) MISC 1 Device by Does not apply route every 3 (three) days. 12/16/20   Shamleffer, Melanie Crazier, MD  Insulin Pen Needle 32G X 4 MM MISC Use as directed 10/07/20   Ghimire, Henreitta Leber, MD  lamoTRIgine (LAMICTAL) 25 MG tablet Take 25 mg by mouth 2 (two) times daily. 12/03/20   [provider]  Ferrous Sulfate (IRON) 325 (65 Fe) MG TABS Take 1 tablet (325 mg total) by mouth every other day. Patient not taking: Reported on 07/25/2020 06/17/19 07/25/20  Chauncey Mann, MD  hydrochlorothiazide (HYDRODIURIL)  25 MG tablet Take 1 tablet (25 mg total) by mouth daily. Patient not taking: No sig reported 10/08/19 07/25/20  Constant, Peggy, MD  promethazine (PHENERGAN) 25 MG tablet Take 1 tablet (25 mg total) by mouth every 8 (eight) hours as needed for nausea or vomiting (if zofran fails). Patient not taking: Reported on 10/06/2020 08/25/20 10/06/20  Varney Biles, MD   Physical Exam: Vitals:   03/07/21 1030 03/07/21 1039 03/07/21 1048 03/07/21 1100  BP: 130/74 138/73  132/72  Pulse: (!) 118 (!) 118  (!) 117  Resp: 16 (!) _0 Temp:      TempSrc:      SpO2: 100% 100%  100%   Constitutional: Looks acutely ill.  NAD, calm, comfortable Eyes: PERRL, lids and conjunctivae normal ENMT: Mucous membranes are dry.  Posterior pharynx clear of any exudate or lesions. Neck: normal, supple, no masses, no thyromegaly Respiratory: no wheezing, no crackles. Normal  respiratory effort. No accessory muscle use.  Cardiovascular: Sinus tachycardia in the 110s, no murmurs / rubs / gallops. No extremity edema. 2+ pedal pulses. No carotid bruits.  Abdomen: No distention.  Bowel sounds positive.  Soft, mild epigastric tenderness, no masses palpated. No hepatosplenomegaly. Musculoskeletal: Mild generalized weakness.  No clubbing / cyanosis. Good ROM, no contractures. Normal muscle tone.  Skin: no rashes, lesions, ulcers  Neurologic: CN 2-12 grossly intact. Sensation intact, DTR normal. Strength 5/5 in all 4.  Psychiatric: Normal judgment and insight. Alert and oriented x 3. Normal mood.   Labs on Admission: I have personally reviewed following labs and imaging studies  CBC: Recent Labs  Lab 03/07/21 0947  WBC 15.2*  NEUTROABS 12.9*  HGB 8.8*  HCT 30.1*  MCV 91.2  PLT 638   Basic Metabolic Panel: Recent Labs  Lab 03/07/21 0947  NA 120*  K 5.7*  CL 91*  CO2 <7*  GLUCOSE 1,230*  BUN 58*  CREATININE 3.38*  CALCIUM 8.5*   GFR: CrCl cannot be calculated (Unknown ideal weight.).  Liver Function Tests: Recent Labs  Lab 03/07/21 0947  AST 26  ALT 24  ALKPHOS 78  BILITOT 1.4*  PROT 6.6  ALBUMIN 3.4*   Radiological Exams on Admission: DG Chest Portable 1 View  Result Date: 03/07/2021 CLINICAL DATA:  Chest pain. EXAM: PORTABLE CHEST 1 VIEW COMPARISON:  December 07, 2020. FINDINGS: The heart size and mediastinal contours are within normal limits. Both lungs are clear. The visualized skeletal structures are unremarkable. IMPRESSION: No active disease. Electronically Signed   By: Marijo Conception M.D.   On: 03/07/2021 10:28    EKG: Independently reviewed.  Vent. rate 117 BPM PR interval 131 ms QRS duration 103 ms QT/QTcB 342/478 ms P-R-T axes 47 56 55 Sinus tachycardia Minimal ST depression, lateral leads Borderline prolonged QT interval  Assessment/Plan Principal Problem:   DKA (diabetic ketoacidosis)  (HCC) Observation/stepdown. Continue IV fluids. Insulin infusion per Endo tool. Monitor BHA anion gap. Monitor renal function electrolytes.  Active Problems:    AKI (acute kidney injury) ([place]) On the stage 3a chronic kidney disease. Continue IV fluids. Monitor intake and output. Avoid hypotension and nephrotoxins. Follow-up renal function electrolytes.    Normocytic anemia Monitor H&H. Transfuse as needed.    Hyperkalemia Continue IVF and insulin infusion. Continue close monitoring of electrolytes.    Hyperbilirubinemia Recheck bilirubin level in AM.    Mild protein malnutrition (HCC) Recheck albumin level in the morning. Consider diabetes coordinator evaluation.   DVT prophylaxis: SCDs. Code Status:   Full  code. Family Communication:   Disposition Plan:   Patient is from:  Home.  Anticipated DC to:  Home.  Anticipated DC date:  03/09/2021.  Anticipated DC barriers: Clinical status.  Consults called:   Admission status:  Observation/stepdown.   Severity of Illness: High severity after presenting with nausea, vomiting, chest pain and dehydration in the setting of DKA.  Patient will remain in the hospital for IV hydration, IV insulin infusion and electrolyte replacement.  Reubin Milan MD [facility]  How to contact the [facility] Attending or Consulting provider [place] or covering provider during after hours [place], for this patient?   Check the care team in [facility] and look for a) attending/consulting TRH provider listed and b) the [facility] team listed Log into [email or web address] and use [place]'s universal password to access. If you do not have the password, please contact the hospital operator. Locate the [facility] provider you are looking for under [facility] and page to a number that you can be directly reached. If you still have difficulty reaching the provider, please page the [facility] (Director on Call) for the Hospitalists listed on amion for  assistance.  03/07/2021, 12:03 PM    This document was prepared using Dragon voice recognition software and may contain some unintended transcription errors.

## 2021-03-07 NOTE — ED Triage Notes (Signed)
Pt arrived via POV, c/o concern for DKA. Hx of same.

## 2021-03-08 DIAGNOSIS — T85624A Displacement of insulin pump, initial encounter: Secondary | ICD-10-CM | POA: Diagnosis present

## 2021-03-08 DIAGNOSIS — N179 Acute kidney failure, unspecified: Secondary | ICD-10-CM | POA: Diagnosis present

## 2021-03-08 DIAGNOSIS — Z825 Family history of asthma and other chronic lower respiratory diseases: Secondary | ICD-10-CM | POA: Diagnosis not present

## 2021-03-08 DIAGNOSIS — E785 Hyperlipidemia, unspecified: Secondary | ICD-10-CM | POA: Diagnosis present

## 2021-03-08 DIAGNOSIS — Z881 Allergy status to other antibiotic agents status: Secondary | ICD-10-CM | POA: Diagnosis not present

## 2021-03-08 DIAGNOSIS — Z888 Allergy status to other drugs, medicaments and biological substances status: Secondary | ICD-10-CM | POA: Diagnosis not present

## 2021-03-08 DIAGNOSIS — D72829 Elevated white blood cell count, unspecified: Secondary | ICD-10-CM | POA: Diagnosis present

## 2021-03-08 DIAGNOSIS — N1831 Chronic kidney disease, stage 3a: Secondary | ICD-10-CM

## 2021-03-08 DIAGNOSIS — Z9641 Presence of insulin pump (external) (internal): Secondary | ICD-10-CM | POA: Diagnosis present

## 2021-03-08 DIAGNOSIS — E875 Hyperkalemia: Secondary | ICD-10-CM | POA: Diagnosis present

## 2021-03-08 DIAGNOSIS — D638 Anemia in other chronic diseases classified elsewhere: Secondary | ICD-10-CM | POA: Diagnosis present

## 2021-03-08 DIAGNOSIS — E1022 Type 1 diabetes mellitus with diabetic chronic kidney disease: Secondary | ICD-10-CM

## 2021-03-08 DIAGNOSIS — Z880 Allergy status to penicillin: Secondary | ICD-10-CM | POA: Diagnosis not present

## 2021-03-08 DIAGNOSIS — E1010 Type 1 diabetes mellitus with ketoacidosis without coma: Secondary | ICD-10-CM | POA: Diagnosis present

## 2021-03-08 DIAGNOSIS — Z833 Family history of diabetes mellitus: Secondary | ICD-10-CM | POA: Diagnosis not present

## 2021-03-08 DIAGNOSIS — Z79899 Other long term (current) drug therapy: Secondary | ICD-10-CM | POA: Diagnosis not present

## 2021-03-08 DIAGNOSIS — E441 Mild protein-calorie malnutrition: Secondary | ICD-10-CM | POA: Diagnosis present

## 2021-03-08 DIAGNOSIS — Z794 Long term (current) use of insulin: Secondary | ICD-10-CM | POA: Diagnosis not present

## 2021-03-08 DIAGNOSIS — E860 Dehydration: Secondary | ICD-10-CM | POA: Diagnosis present

## 2021-03-08 DIAGNOSIS — T383X6A Underdosing of insulin and oral hypoglycemic [antidiabetic] drugs, initial encounter: Secondary | ICD-10-CM | POA: Diagnosis present

## 2021-03-08 DIAGNOSIS — Z20822 Contact with and (suspected) exposure to covid-19: Secondary | ICD-10-CM | POA: Diagnosis present

## 2021-03-08 DIAGNOSIS — R17 Unspecified jaundice: Secondary | ICD-10-CM | POA: Diagnosis present

## 2021-03-08 LAB — COMPREHENSIVE METABOLIC PANEL
ALT: 17 U/L (ref 0–44)
AST: 18 U/L (ref 15–41)
Albumin: 2.4 g/dL — ABNORMAL LOW (ref 3.5–5.0)
Alkaline Phosphatase: 58 U/L (ref 38–126)
Anion gap: 8 (ref 5–15)
BUN: 44 mg/dL — ABNORMAL HIGH (ref 6–20)
CO2: 19 mmol/L — ABNORMAL LOW (ref 22–32)
Calcium: 8.3 mg/dL — ABNORMAL LOW (ref 8.9–10.3)
Chloride: 108 mmol/L (ref 98–111)
Creatinine, Ser: 2.48 mg/dL — ABNORMAL HIGH (ref 0.44–1.00)
GFR, Estimated: 26 mL/min — ABNORMAL LOW (ref >60)
Glucose, Bld: 173 mg/dL — ABNORMAL HIGH (ref 70–99)
Potassium: 3.7 mmol/L (ref 3.5–5.1)
Sodium: 135 mmol/L (ref 135–145)
Total Bilirubin: 0.3 mg/dL (ref 0.3–1.2)
Total Protein: 5 g/dL — ABNORMAL LOW (ref 6.5–8.1)

## 2021-03-08 LAB — CBC
HCT: 22.8 % — ABNORMAL LOW (ref 36.0–46.0)
Hemoglobin: 7.4 g/dL — ABNORMAL LOW (ref 12.0–15.0)
MCH: 26.3 pg (ref 26.0–34.0)
MCHC: 32.5 g/dL (ref 30.0–36.0)
MCV: 81.1 fL (ref 80.0–100.0)
Platelets: 280 10*3/uL (ref 150–400)
RBC: 2.81 MIL/uL — ABNORMAL LOW (ref 3.87–5.11)
RDW: 13.8 % (ref 11.5–15.5)
WBC: 19.8 10*3/uL — ABNORMAL HIGH (ref 4.0–10.5)
nRBC: 0 % (ref 0.0–0.2)

## 2021-03-08 LAB — BASIC METABOLIC PANEL
Anion gap: 4 — ABNORMAL LOW (ref 5–15)
BUN: 45 mg/dL — ABNORMAL HIGH (ref 6–20)
CO2: 21 mmol/L — ABNORMAL LOW (ref 22–32)
Calcium: 8.1 mg/dL — ABNORMAL LOW (ref 8.9–10.3)
Chloride: 110 mmol/L (ref 98–111)
Creatinine, Ser: 2.36 mg/dL — ABNORMAL HIGH (ref 0.44–1.00)
GFR, Estimated: 28 mL/min — ABNORMAL LOW (ref >60)
Glucose, Bld: 171 mg/dL — ABNORMAL HIGH (ref 70–99)
Potassium: 3.5 mmol/L (ref 3.5–5.1)
Sodium: 135 mmol/L (ref 135–145)

## 2021-03-08 LAB — GLUCOSE, CAPILLARY
Glucose-Capillary: 138 mg/dL — ABNORMAL HIGH (ref 70–99)
Glucose-Capillary: 153 mg/dL — ABNORMAL HIGH (ref 70–99)
Glucose-Capillary: 154 mg/dL — ABNORMAL HIGH (ref 70–99)
Glucose-Capillary: 156 mg/dL — ABNORMAL HIGH (ref 70–99)
Glucose-Capillary: 161 mg/dL — ABNORMAL HIGH (ref 70–99)
Glucose-Capillary: 163 mg/dL — ABNORMAL HIGH (ref 70–99)
Glucose-Capillary: 166 mg/dL — ABNORMAL HIGH (ref 70–99)
Glucose-Capillary: 167 mg/dL — ABNORMAL HIGH (ref 70–99)
Glucose-Capillary: 341 mg/dL — ABNORMAL HIGH (ref 70–99)
Glucose-Capillary: 74 mg/dL (ref 70–99)

## 2021-03-08 LAB — BETA-HYDROXYBUTYRIC ACID: Beta-Hydroxybutyric Acid: 0.43 mmol/L — ABNORMAL HIGH (ref 0.05–0.27)

## 2021-03-08 MED ORDER — INSULIN ASPART 100 UNIT/ML IJ SOLN
0.0000 [IU] | Freq: Three times a day (TID) | INTRAMUSCULAR | Status: DC
  Administered 2021-03-08: 3 [IU] via SUBCUTANEOUS
  Administered 2021-03-09: 5 [IU] via SUBCUTANEOUS

## 2021-03-08 MED ORDER — SODIUM CHLORIDE 0.45 % IV SOLN: INTRAVENOUS | Status: AC

## 2021-03-08 MED ORDER — INSULIN GLARGINE-YFGN 100 UNIT/ML ~~LOC~~ SOLN
16.0000 [IU] | SUBCUTANEOUS | Status: DC
  Administered 2021-03-08: 16 [IU] via SUBCUTANEOUS
  Filled 2021-03-08 (×2): qty 0.16

## 2021-03-08 MED ORDER — INSULIN ASPART 100 UNIT/ML IJ SOLN
5.0000 [IU] | Freq: Once | INTRAMUSCULAR | Status: AC
  Administered 2021-03-08: 5 [IU] via SUBCUTANEOUS

## 2021-03-08 MED ORDER — INSULIN ASPART 100 UNIT/ML IJ SOLN
5.0000 [IU] | Freq: Three times a day (TID) | INTRAMUSCULAR | Status: DC
  Administered 2021-03-08 – 2021-03-09 (×3): 5 [IU] via SUBCUTANEOUS

## 2021-03-08 NOTE — Assessment & Plan Note (Signed)
Patient's baseline creatinine is about 1.7-2. Creatinine of 3.38 on admission secondary to dehydration from DKA and hyperglycemia. Improving with IV fluids -Continue IV fluids -BMP in AM

## 2021-03-08 NOTE — Assessment & Plan Note (Addendum)
Possibly related to bent pump needle. Patient is managed on an insulin pump as an outpatient. Anion gap closed with improved acidosis.

## 2021-03-08 NOTE — Assessment & Plan Note (Addendum)
Chronic issue. Unknown etiology. Baseline of about 9-10. Downward drift this admission but stable. Patient is premenopausal. Patient will need outpatient follow-up and management.

## 2021-03-08 NOTE — Assessment & Plan Note (Addendum)
Dietitian consulted but unable to see prior to discharge. Outpatient referral per PCP.

## 2021-03-08 NOTE — Assessment & Plan Note (Addendum)
Reactive. Improved prior to discharge.

## 2021-03-08 NOTE — Hospital Course (Signed)
Ashley Freeman is a 30 yo female with a history of prior abscess infections, anemia, diabetes mellitus type 1, DKA, hyperlipidemia, CKD stage IIIa. Patient presented secondary to concerns for DKA. On admission she was found to have evidence of DKA and was started on an insulin drip via IV.

## 2021-03-08 NOTE — Assessment & Plan Note (Signed)
Transient. Resolved.

## 2021-03-08 NOTE — Assessment & Plan Note (Signed)
Mild. Likely falsely elevated in setting of DKA. Treated with IV fluids and insulin. Resolved.

## 2021-03-08 NOTE — Assessment & Plan Note (Signed)
Hemoglobin A1C of 13% from 11/2020.  Patient is managed on an insulin pump as an outpatient. Poorly controlled with hyperglycemia.

## 2021-03-08 NOTE — Progress Notes (Signed)
PROGRESS NOTE    Kristiane Morsch Freeman  OEU:[id number] DOB: [birth year] DOA: 03/07/2021 PCP: [name], PA-C   Brief Narrative: Ashley Freeman is a 30 yo female with a history of prior abscess infections, anemia, diabetes mellitus type 1, DKA, hyperlipidemia, CKD stage IIIa. Patient presented secondary to concerns for DKA. On admission she was found to have evidence of DKA and was started on an insulin drip via IV.   Assessment & Plan:   * DKA (diabetic ketoacidosis) ([place]) Unknown etiology. Patient is managed on an insulin pump as an outpatient. Anion gap closed with improved acidosis. -Repeat BMP this morning -If BMP stable, transition to subcutaneous insulin and meal coverage  Mild protein malnutrition ([place]) -Dietitian consult  Hyperbilirubinemia Transient. Resolved.  AKI (acute kidney injury) ([place]) Patient's baseline creatinine is about 1.7-2. Creatinine of 3.38 on admission secondary to dehydration from DKA and hyperglycemia. 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  Patient is managed on an insulin pump as an outpatient. Poorly controlled with hyperglycemia.  Hyperkalemia Mild. Likely falsely elevated in setting of DKA. Treated with IV fluids and insulin. Resolved.  Normocytic anemia Chronic issue. Unknown etiology. Baseline of about 9-10. Downward drift this admission. Patient is premenopausal.  Leukocytosis Unsure of etiology. No source of infection. Possibly reactive in setting of DKA. -CBC daily   DVT prophylaxis: SCDs Code Status:   Code Status: Full Code Family Communication: None at bedside Disposition Plan: Discharge home in 1-2 days   Consultants:  None  Procedures:  None  Antimicrobials: None    Subjective: Stable. Hungry. No concerns this morning.  Objective: Vitals:   03/08/21 0500 03/08/21  0600 03/08/21 0700 03/08/21 0800  BP: 140/83 (!) 150/80 (!) 143/82 (!) 148/88  Pulse: 93 92 96 95  Resp: 16 16 15 16   Temp:    98.2 F (36.8 C)  TempSrc:    Oral  SpO2: 99% 99% 99% 100%  Weight:      Height:        Intake/Output Summary (Last 24 hours) at 03/08/2021 0836 Last data filed at 03/08/2021 0800 Gross per 24 hour  Intake 2175.53 ml  Output --  Net 2175.53 ml   Filed Weights   03/07/21 1800  Weight: 56 kg    Examination:  General exam: Appears calm and comfortable Respiratory system: Clear to auscultation. Respiratory effort normal. Cardiovascular system: S1 & S2 heard, RRR. Gastrointestinal system: Abdomen is nondistended, soft and nontender. No organomegaly or masses felt. Normal bowel sounds heard. Central nervous system: Alert and oriented. No focal neurological deficits. Musculoskeletal: No edema. No calf tenderness Skin: No cyanosis. No rashes Psychiatry: Judgement and insight appear normal. Mood & affect appropriate.     Data Reviewed: I have personally reviewed following labs and imaging studies  CBC Lab Results  Component Value Date   WBC 19.8 (H) 03/08/2021   RBC 2.81 (L) 03/08/2021   HGB 7.4 (L) 03/08/2021   HCT 22.8 (L) 03/08/2021   MCV 81.1 03/08/2021   MCH 26.3 03/08/2021   PLT 280 03/08/2021   MCHC 32.5 03/08/2021   RDW 13.8 03/08/2021   LYMPHSABS 1.5 03/07/2021   MONOABS 0.7 03/07/2021   EOSABS 0.0 03/07/2021   BASOSABS 0.0 15/40/0867     Last metabolic panel Lab Results  Component Value Date   NA 135 03/08/2021   K 3.7 03/08/2021   CL  108 03/08/2021   CO2 19 (L) 03/08/2021   BUN 44 (H) 03/08/2021   CREATININE 2.48 (H) 03/08/2021   GLUCOSE 173 (H) 03/08/2021   GFRNONAA 26 (L) 03/08/2021   GFRAA 44 (L) 01/30/2020   CALCIUM 8.3 (L) 03/08/2021   PHOS 3.6 03/07/2021   PROT 5.0 (L) 03/08/2021   ALBUMIN 2.4 (L) 03/08/2021   LABGLOB 3.0 09/08/2019   AGRATIO 1.0 (L) 09/08/2019   BILITOT 0.3 03/08/2021   ALKPHOS 58 03/08/2021    AST 18 03/08/2021   ALT 17 03/08/2021   ANIONGAP 8 03/08/2021    CBG (last 3)  Recent Labs    03/08/21 0557 03/08/21 0655 03/08/21 0758  GLUCAP 156* 163* 153*     GFR: Estimated Creatinine Clearance: 27.9 mL/min (A) (by C-G formula based on SCr of 2.48 mg/dL (H)).  Coagulation Profile: No results for input(s): INR, PROTIME in the last 168 hours.  Recent Results (from the past 240 hour(s))  Resp Panel by RT-PCR (Flu A&B, Covid) Nasopharyngeal Swab     Status: None   Collection Time: 03/07/21  9:47 AM   Specimen: Nasopharyngeal Swab; Nasopharyngeal(NP) swabs in vial transport medium  Result Value Ref Range Status   SARS Coronavirus 2 by RT PCR NEGATIVE NEGATIVE Final    Comment: (NOTE) SARS-CoV-2 target nucleic acids are NOT DETECTED.  The SARS-CoV-2 RNA is generally detectable in upper respiratory specimens during the acute phase of infection. The lowest concentration of SARS-CoV-2 viral copies this assay can detect is 138 copies/mL. A negative result does not preclude SARS-Cov-2 infection and should not be used as the sole basis for treatment or other patient management decisions. A negative result may occur with  improper specimen collection/handling, submission of specimen other than nasopharyngeal swab, presence of viral mutation(s) within the areas targeted by this assay, and inadequate number of viral copies(<138 copies/mL). A negative result must be combined with clinical observations, patient history, and epidemiological information. The expected result is Negative.  Fact Sheet for Patients:  EntrepreneurPulse.com.au  Fact Sheet for Healthcare Providers:  IncredibleEmployment.be  This test is no t yet approved or cleared by the [place] FDA and  has been authorized for detection and/or diagnosis of SARS-CoV-2 by FDA under an Emergency Use Authorization (EUA). This EUA will remain  in effect (meaning this test can be  used) for the duration of the COVID-19 declaration under Section 564(b)(1) of the Act, 21 U.S.C.section 360bbb-3(b)(1), unless the authorization is terminated  or revoked sooner.       Influenza A by PCR NEGATIVE NEGATIVE Final   Influenza B by PCR NEGATIVE NEGATIVE Final    Comment: (NOTE) The Xpert Xpress SARS-CoV-2/FLU/RSV plus assay is intended as an aid in the diagnosis of influenza from Nasopharyngeal swab specimens and should not be used as a sole basis for treatment. Nasal washings and aspirates are unacceptable for Xpert Xpress SARS-CoV-2/FLU/RSV testing.  Fact Sheet for Patients: EntrepreneurPulse.com.au  Fact Sheet for Healthcare Providers: IncredibleEmployment.be  This test is not yet approved or cleared by the [place] FDA and has been authorized for detection and/or diagnosis of SARS-CoV-2 by FDA under an Emergency Use Authorization (EUA). This EUA will remain in effect (meaning this test can be used) for the duration of the COVID-19 declaration under Section 564(b)(1) of the Act, 21 U.S.C. section 360bbb-3(b)(1), unless the authorization is terminated or revoked.  Performed at [facility], [place] [address], [place],  [address]   MRSA Next Gen by PCR, Nasal  Status: None   Collection Time: 03/07/21  5:58 PM   Specimen: Nasal Mucosa; Nasal Swab  Result Value Ref Range Status   MRSA by PCR Next Gen NOT DETECTED NOT DETECTED Final    Comment: (NOTE) The GeneXpert MRSA Assay (FDA approved for NASAL specimens only), is one component of a comprehensive MRSA colonization surveillance program. It is not intended to diagnose MRSA infection nor to guide or monitor treatment for MRSA infections. Test performance is not FDA approved in patients less than 42 years old. Performed at [facility], [place] [address] [place], [place] [address]         Radiology Studies: DG Chest  Portable 1 View  Result Date: 03/07/2021 CLINICAL DATA:  Chest pain. EXAM: PORTABLE CHEST 1 VIEW COMPARISON:  December 07, 2020. FINDINGS: The heart size and mediastinal contours are within normal limits. Both lungs are clear. The visualized skeletal structures are unremarkable. IMPRESSION: No active disease. Electronically Signed   By: Marijo Conception M.D.   On: 03/07/2021 10:28        Scheduled Meds:  Chlorhexidine Gluconate Cloth  6 each Topical Daily   Continuous Infusions:  dextrose 5% lactated ringers 125 mL/hr at 03/08/21 0501   insulin 1.1 Units/hr (03/08/21 0801)   lactated ringers Stopped (03/07/21 2148)     LOS: 0 days     Cordelia Poche, MD [facility] 03/08/2021, 8:36 AM  If 7PM-7AM, please contact night-coverage [email or web address]

## 2021-03-09 DIAGNOSIS — E1010 Type 1 diabetes mellitus with ketoacidosis without coma: Secondary | ICD-10-CM | POA: Diagnosis not present

## 2021-03-09 LAB — BASIC METABOLIC PANEL
Anion gap: 6 (ref 5–15)
BUN: 36 mg/dL — ABNORMAL HIGH (ref 6–20)
CO2: 23 mmol/L (ref 22–32)
Calcium: 8.3 mg/dL — ABNORMAL LOW (ref 8.9–10.3)
Chloride: 106 mmol/L (ref 98–111)
Creatinine, Ser: 1.83 mg/dL — ABNORMAL HIGH (ref 0.44–1.00)
GFR, Estimated: 38 mL/min — ABNORMAL LOW (ref >60)
Glucose, Bld: 307 mg/dL — ABNORMAL HIGH (ref 70–99)
Potassium: 3.3 mmol/L — ABNORMAL LOW (ref 3.5–5.1)
Sodium: 135 mmol/L (ref 135–145)

## 2021-03-09 LAB — CBC
HCT: 23.9 % — ABNORMAL LOW (ref 36.0–46.0)
Hemoglobin: 7.8 g/dL — ABNORMAL LOW (ref 12.0–15.0)
MCH: 26.4 pg (ref 26.0–34.0)
MCHC: 32.6 g/dL (ref 30.0–36.0)
MCV: 81 fL (ref 80.0–100.0)
Platelets: 274 10*3/uL (ref 150–400)
RBC: 2.95 MIL/uL — ABNORMAL LOW (ref 3.87–5.11)
RDW: 14.1 % (ref 11.5–15.5)
WBC: 12.7 10*3/uL — ABNORMAL HIGH (ref 4.0–10.5)
nRBC: 0 % (ref 0.0–0.2)

## 2021-03-09 LAB — GLUCOSE, CAPILLARY
Glucose-Capillary: 218 mg/dL — ABNORMAL HIGH (ref 70–99)
Glucose-Capillary: 233 mg/dL — ABNORMAL HIGH (ref 70–99)
Glucose-Capillary: 130 mg/dL — ABNORMAL HIGH (ref 70–99)

## 2021-03-09 LAB — HEMOGLOBIN A1C
Hgb A1c MFr Bld: 10.5 % — ABNORMAL HIGH (ref 4.8–5.6)
Mean Plasma Glucose: 255 mg/dL

## 2021-03-09 MED ORDER — AMLODIPINE BESYLATE 5 MG PO TABS: 10.0000 mg | ORAL_TABLET | Freq: Every day | ORAL | 0 refills | Status: DC

## 2021-03-09 MED ORDER — INSULIN PUMP
Freq: Three times a day (TID) | SUBCUTANEOUS | Status: DC
  Filled 2021-03-09: qty 1

## 2021-03-09 MED ORDER — AMLODIPINE BESYLATE 5 MG PO TABS
5.0000 mg | ORAL_TABLET | Freq: Every day | ORAL | Status: DC
  Administered 2021-03-09: 5 mg via ORAL
  Filled 2021-03-09: qty 1

## 2021-03-09 MED ORDER — POTASSIUM CHLORIDE CRYS ER 20 MEQ PO TBCR
40.0000 meq | EXTENDED_RELEASE_TABLET | Freq: Once | ORAL | Status: AC
  Administered 2021-03-09: 40 meq via ORAL
  Filled 2021-03-09: qty 2

## 2021-03-09 NOTE — Progress Notes (Signed)
Patient discharged to home w/ family. Given all belongings, instructions. Verbalized understanding of instructions. Insulin pump in place.

## 2021-03-09 NOTE — Progress Notes (Signed)
Inpatient Diabetes Program Recommendations  AACE/ADA: New Consensus Statement on Inpatient Glycemic Control (2015)  Target Ranges:  Prepandial:   less than 140 mg/dL      Peak postprandial:   less than 180 mg/dL (1-2 hours)      Critically ill patients:  140 - 180 mg/dL   Lab Results  Component Value Date   GLUCAP 130 (H) 03/09/2021   HGBA1C 10.5 (H) 03/07/2021    Review of Glycemic Control  Diabetes history: DM1 Outpatient Diabetes medications: OmniPod, Novolog 0-9 units TID Current orders for Inpatient glycemic control: Insulin pump  Basal:0000 - 0.5, 0800 - 0.6 CHO ratio 1:12 ISF - 65 Goal - 100  Last Endo appt: 12/16/20  218, 233. 130 mg/dL HgbA1C - 10.5%, down from 13% on 12/08/20.  Inpatient Diabetes Program Recommendations:    Pt to restart insulin pump now.  Instructed her to f/u with her Endo about recent issues with her pump. States she is supposed to be getting new pump very soon. Has Dexcom she uses for monitoring. Uses Novolog in pump. Pt to sign contract for insulin pump.  Answered questions.  Follow.  Thank you. Lorenda Peck, RD, LDN, CDE Inpatient Diabetes Coordinator [phone]

## 2021-03-09 NOTE — Progress Notes (Signed)
Nutrition Brief Note  RD consulted for nutritional assessment.  Pt in room, eating lunch meal. Reports no issues with appetite and no diet related questions. Denies weight changes.  Noted patient to discharge today.  Wt Readings from Last 15 Encounters:  03/07/21 56 kg  12/16/20 60.3 kg  12/08/20 55.7 kg  10/06/20 57.2 kg  09/16/20 54.9 kg  08/25/20 56.7 kg  07/25/20 58.1 kg  07/15/20 58.1 kg  03/11/20 61 kg  02/02/20 61.2 kg  02/01/20 56.7 kg  01/26/20 60.1 kg  01/19/20 60.1 kg  11/03/19 61.3 kg  10/08/19 81.6 kg    Body mass index is 21.87 kg/m. Patient meets criteria for normal based on current BMI.   Current diet order is CHO modified, patient is consuming approximately 100% of meals at this time. Labs and medications reviewed.   No nutrition interventions warranted at this time. If nutrition issues arise, please consult RD.   Clayton Bibles, MS, RD, LDN Inpatient Clinical Dietitian Contact information available via Amion

## 2021-03-09 NOTE — Discharge Summary (Signed)
  Physician Discharge Summary  Ashley Freeman MRN:[id number] DOB: 11/18/1990 DOA: 03/07/2021  PCP: Stein, William J, PA-C  Admit date: 03/07/2021 Discharge date: 03/09/2021  Admitted From: Home Disposition: home  Recommendations for Outpatient Follow-up:  Follow up with PCP in 1 week Please obtain BMP/CBC in one week Please follow up on the following pending results: None  Home Health: None Equipment/Devices: None  Discharge Condition: Stable CODE STATUS: Full code Diet recommendation: Carb modified   Brief/Interim Summary:  Admission HPI written by David Manuel Ortiz, MD  HPI: Ashley Freeman is a 30 y.o. female with medical history significant of right gluteal abscess, chronic disease anemia, Bartholin's glands abscess, bacterial vaginosis, history of gonorrhea, history of trichomoniasis, type I DM, multiple episodes of DKA, hyperlipidemia, history of sepsis, stage 3a CKD who came into the emergency department via private operated vehicle with concerns for DKA..  Patient stated that after she woke up she has been dyspneic, has abdominal pain and vomited several times.  She had polydipsia last night.  No diarrhea, constipation, melena or hematochezia.  She denied fever, chills, sore throat, rhinorrhea, productive cough, wheezing or hemoptysis.  No chest pain, palpitations, diaphoresis, PND, orthopnea or pitting edema lower extremities.   Hospital course:  * DKA (diabetic ketoacidosis) (HCC)-resolved as of 03/09/2021 Possibly related to bent pump needle. Patient is managed on an insulin pump as an outpatient. Anion gap closed with improved acidosis.  Mild protein malnutrition (HCC) Dietitian consulted but unable to see prior to discharge. Outpatient referral per PCP.  Hyperbilirubinemia Transient. Resolved.  AKI (acute kidney injury) (HCC) Patient's baseline creatinine is about 1.7-2. Creatinine of 3.38 on admission secondary to dehydration from DKA  and hyperglycemia. 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  Patient is managed on an insulin pump as an outpatient. Poorly controlled with hyperglycemia.  Normocytic anemia Chronic issue. Unknown etiology. Baseline of about 9-10. Downward drift this admission but stable. Patient is premenopausal. Patient will need outpatient follow-up and management.  Leukocytosis Reactive. Improved prior to discharge.  Hyperkalemia-resolved as of 03/09/2021 Mild. Likely falsely elevated in setting of DKA. Treated with IV fluids and insulin. Resolved.   Discharge Instructions   Allergies as of 03/09/2021       Reactions   Cephalexin Anaphylaxis   Penicillins Hives, Rash   Has patient had a PCN reaction causing immediate rash, facial/tongue/throat swelling, SOB or lightheadedness with hypotension: Yes Has patient had a PCN reaction causing severe rash involving mucus membranes or skin necrosis: No Has patient had a PCN reaction that required hospitalization: Yes Has patient had a PCN reaction occurring within the last 10 years: No Spoke with pt - childhood hives told by mom, tried no pcns since, doesn't remember reaction herself    Benadryl [diphenhydramine] Itching   Doxycycline Itching        Medication List     TAKE these medications    albuterol 108 (90 Base) MCG/ACT inhaler Commonly known as: VENTOLIN HFA Inhale 2 puffs into the lungs every 4 (four) hours as needed for wheezing or shortness of breath.   amLODipine 5 MG tablet Commonly known as: NORVASC Take 2 tablets (10 mg total) by mouth daily. Start taking on: March 10, 2021   blood glucose meter kit and supplies Kit Dispense based on patient and insurance preference. Use up to four times daily as directed. (FOR ICD-9 250.00,     Physician Discharge Summary  Ashley Freeman MRN:[id number] DOB: 11/18/1990 DOA: 03/07/2021  PCP: Stein, William J, PA-C  Admit date: 03/07/2021 Discharge date: 03/09/2021  Admitted From: Home Disposition: home  Recommendations for Outpatient Follow-up:  Follow up with PCP in 1 week Please obtain BMP/CBC in one week Please follow up on the following pending results: None  Home Health: None Equipment/Devices: None  Discharge Condition: Stable CODE STATUS: Full code Diet recommendation: Carb modified   Brief/Interim Summary:  Admission HPI written by David Manuel Ortiz, MD  HPI: Ashley Freeman is a 30 y.o. female with medical history significant of right gluteal abscess, chronic disease anemia, Bartholin's glands abscess, bacterial vaginosis, history of gonorrhea, history of trichomoniasis, type I DM, multiple episodes of DKA, hyperlipidemia, history of sepsis, stage 3a CKD who came into the emergency department via private operated vehicle with concerns for DKA..  Patient stated that after she woke up she has been dyspneic, has abdominal pain and vomited several times.  She had polydipsia last night.  No diarrhea, constipation, melena or hematochezia.  She denied fever, chills, sore throat, rhinorrhea, productive cough, wheezing or hemoptysis.  No chest pain, palpitations, diaphoresis, PND, orthopnea or pitting edema lower extremities.   Hospital course:  * DKA (diabetic ketoacidosis) (HCC)-resolved as of 03/09/2021 Possibly related to bent pump needle. Patient is managed on an insulin pump as an outpatient. Anion gap closed with improved acidosis.  Mild protein malnutrition (HCC) Dietitian consulted but unable to see prior to discharge. Outpatient referral per PCP.  Hyperbilirubinemia Transient. Resolved.  AKI (acute kidney injury) (HCC) Patient's baseline creatinine is about 1.7-2. Creatinine of 3.38 on admission secondary to dehydration from DKA  and hyperglycemia. 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  Patient is managed on an insulin pump as an outpatient. Poorly controlled with hyperglycemia.  Normocytic anemia Chronic issue. Unknown etiology. Baseline of about 9-10. Downward drift this admission but stable. Patient is premenopausal. Patient will need outpatient follow-up and management.  Leukocytosis Reactive. Improved prior to discharge.  Hyperkalemia-resolved as of 03/09/2021 Mild. Likely falsely elevated in setting of DKA. Treated with IV fluids and insulin. Resolved.   Discharge Instructions   Allergies as of 03/09/2021       Reactions   Cephalexin Anaphylaxis   Penicillins Hives, Rash   Has patient had a PCN reaction causing immediate rash, facial/tongue/throat swelling, SOB or lightheadedness with hypotension: Yes Has patient had a PCN reaction causing severe rash involving mucus membranes or skin necrosis: No Has patient had a PCN reaction that required hospitalization: Yes Has patient had a PCN reaction occurring within the last 10 years: No Spoke with pt - childhood hives told by mom, tried no pcns since, doesn't remember reaction herself    Benadryl [diphenhydramine] Itching   Doxycycline Itching        Medication List     TAKE these medications    albuterol 108 (90 Base) MCG/ACT inhaler Commonly known as: VENTOLIN HFA Inhale 2 puffs into the lungs every 4 (four) hours as needed for wheezing or shortness of breath.   amLODipine 5 MG tablet Commonly known as: NORVASC Take 2 tablets (10 mg total) by mouth daily. Start taking on: March 10, 2021   blood glucose meter kit and supplies Kit Dispense based on patient and insurance preference. Use up to four times daily as directed. (FOR ICD-9 250.00,     Physician Discharge Summary  Ashley Freeman MRN:[id number] DOB: 11/18/1990 DOA: 03/07/2021  PCP: Stein, William J, PA-C  Admit date: 03/07/2021 Discharge date: 03/09/2021  Admitted From: Home Disposition: home  Recommendations for Outpatient Follow-up:  Follow up with PCP in 1 week Please obtain BMP/CBC in one week Please follow up on the following pending results: None  Home Health: None Equipment/Devices: None  Discharge Condition: Stable CODE STATUS: Full code Diet recommendation: Carb modified   Brief/Interim Summary:  Admission HPI written by David Manuel Ortiz, MD  HPI: Ashley Freeman is a 30 y.o. female with medical history significant of right gluteal abscess, chronic disease anemia, Bartholin's glands abscess, bacterial vaginosis, history of gonorrhea, history of trichomoniasis, type I DM, multiple episodes of DKA, hyperlipidemia, history of sepsis, stage 3a CKD who came into the emergency department via private operated vehicle with concerns for DKA..  Patient stated that after she woke up she has been dyspneic, has abdominal pain and vomited several times.  She had polydipsia last night.  No diarrhea, constipation, melena or hematochezia.  She denied fever, chills, sore throat, rhinorrhea, productive cough, wheezing or hemoptysis.  No chest pain, palpitations, diaphoresis, PND, orthopnea or pitting edema lower extremities.   Hospital course:  * DKA (diabetic ketoacidosis) (HCC)-resolved as of 03/09/2021 Possibly related to bent pump needle. Patient is managed on an insulin pump as an outpatient. Anion gap closed with improved acidosis.  Mild protein malnutrition (HCC) Dietitian consulted but unable to see prior to discharge. Outpatient referral per PCP.  Hyperbilirubinemia Transient. Resolved.  AKI (acute kidney injury) (HCC) Patient's baseline creatinine is about 1.7-2. Creatinine of 3.38 on admission secondary to dehydration from DKA  and hyperglycemia. 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  Patient is managed on an insulin pump as an outpatient. Poorly controlled with hyperglycemia.  Normocytic anemia Chronic issue. Unknown etiology. Baseline of about 9-10. Downward drift this admission but stable. Patient is premenopausal. Patient will need outpatient follow-up and management.  Leukocytosis Reactive. Improved prior to discharge.  Hyperkalemia-resolved as of 03/09/2021 Mild. Likely falsely elevated in setting of DKA. Treated with IV fluids and insulin. Resolved.   Discharge Instructions   Allergies as of 03/09/2021       Reactions   Cephalexin Anaphylaxis   Penicillins Hives, Rash   Has patient had a PCN reaction causing immediate rash, facial/tongue/throat swelling, SOB or lightheadedness with hypotension: Yes Has patient had a PCN reaction causing severe rash involving mucus membranes or skin necrosis: No Has patient had a PCN reaction that required hospitalization: Yes Has patient had a PCN reaction occurring within the last 10 years: No Spoke with pt - childhood hives told by mom, tried no pcns since, doesn't remember reaction herself    Benadryl [diphenhydramine] Itching   Doxycycline Itching        Medication List     TAKE these medications    albuterol 108 (90 Base) MCG/ACT inhaler Commonly known as: VENTOLIN HFA Inhale 2 puffs into the lungs every 4 (four) hours as needed for wheezing or shortness of breath.   amLODipine 5 MG tablet Commonly known as: NORVASC Take 2 tablets (10 mg total) by mouth daily. Start taking on: March 10, 2021   blood glucose meter kit and supplies Kit Dispense based on patient and insurance preference. Use up to four times daily as directed. (FOR ICD-9 250.00,     Physician Discharge Summary  Ashley Freeman MRN:[id number] DOB: 11/18/1990 DOA: 03/07/2021  PCP: Stein, William J, PA-C  Admit date: 03/07/2021 Discharge date: 03/09/2021  Admitted From: Home Disposition: home  Recommendations for Outpatient Follow-up:  Follow up with PCP in 1 week Please obtain BMP/CBC in one week Please follow up on the following pending results: None  Home Health: None Equipment/Devices: None  Discharge Condition: Stable CODE STATUS: Full code Diet recommendation: Carb modified   Brief/Interim Summary:  Admission HPI written by David Manuel Ortiz, MD  HPI: Ashley Freeman is a 30 y.o. female with medical history significant of right gluteal abscess, chronic disease anemia, Bartholin's glands abscess, bacterial vaginosis, history of gonorrhea, history of trichomoniasis, type I DM, multiple episodes of DKA, hyperlipidemia, history of sepsis, stage 3a CKD who came into the emergency department via private operated vehicle with concerns for DKA..  Patient stated that after she woke up she has been dyspneic, has abdominal pain and vomited several times.  She had polydipsia last night.  No diarrhea, constipation, melena or hematochezia.  She denied fever, chills, sore throat, rhinorrhea, productive cough, wheezing or hemoptysis.  No chest pain, palpitations, diaphoresis, PND, orthopnea or pitting edema lower extremities.   Hospital course:  * DKA (diabetic ketoacidosis) (HCC)-resolved as of 03/09/2021 Possibly related to bent pump needle. Patient is managed on an insulin pump as an outpatient. Anion gap closed with improved acidosis.  Mild protein malnutrition (HCC) Dietitian consulted but unable to see prior to discharge. Outpatient referral per PCP.  Hyperbilirubinemia Transient. Resolved.  AKI (acute kidney injury) (HCC) Patient's baseline creatinine is about 1.7-2. Creatinine of 3.38 on admission secondary to dehydration from DKA  and hyperglycemia. 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  Patient is managed on an insulin pump as an outpatient. Poorly controlled with hyperglycemia.  Normocytic anemia Chronic issue. Unknown etiology. Baseline of about 9-10. Downward drift this admission but stable. Patient is premenopausal. Patient will need outpatient follow-up and management.  Leukocytosis Reactive. Improved prior to discharge.  Hyperkalemia-resolved as of 03/09/2021 Mild. Likely falsely elevated in setting of DKA. Treated with IV fluids and insulin. Resolved.   Discharge Instructions   Allergies as of 03/09/2021       Reactions   Cephalexin Anaphylaxis   Penicillins Hives, Rash   Has patient had a PCN reaction causing immediate rash, facial/tongue/throat swelling, SOB or lightheadedness with hypotension: Yes Has patient had a PCN reaction causing severe rash involving mucus membranes or skin necrosis: No Has patient had a PCN reaction that required hospitalization: Yes Has patient had a PCN reaction occurring within the last 10 years: No Spoke with pt - childhood hives told by mom, tried no pcns since, doesn't remember reaction herself    Benadryl [diphenhydramine] Itching   Doxycycline Itching        Medication List     TAKE these medications    albuterol 108 (90 Base) MCG/ACT inhaler Commonly known as: VENTOLIN HFA Inhale 2 puffs into the lungs every 4 (four) hours as needed for wheezing or shortness of breath.   amLODipine 5 MG tablet Commonly known as: NORVASC Take 2 tablets (10 mg total) by mouth daily. Start taking on: March 10, 2021   blood glucose meter kit and supplies Kit Dispense based on patient and insurance preference. Use up to four times daily as directed. (FOR ICD-9 250.00,     Physician Discharge Summary  Ashley Freeman MRN:[id number] DOB: 11/18/1990 DOA: 03/07/2021  PCP: Stein, William J, PA-C  Admit date: 03/07/2021 Discharge date: 03/09/2021  Admitted From: Home Disposition: home  Recommendations for Outpatient Follow-up:  Follow up with PCP in 1 week Please obtain BMP/CBC in one week Please follow up on the following pending results: None  Home Health: None Equipment/Devices: None  Discharge Condition: Stable CODE STATUS: Full code Diet recommendation: Carb modified   Brief/Interim Summary:  Admission HPI written by David Manuel Ortiz, MD  HPI: Ashley Freeman is a 30 y.o. female with medical history significant of right gluteal abscess, chronic disease anemia, Bartholin's glands abscess, bacterial vaginosis, history of gonorrhea, history of trichomoniasis, type I DM, multiple episodes of DKA, hyperlipidemia, history of sepsis, stage 3a CKD who came into the emergency department via private operated vehicle with concerns for DKA..  Patient stated that after she woke up she has been dyspneic, has abdominal pain and vomited several times.  She had polydipsia last night.  No diarrhea, constipation, melena or hematochezia.  She denied fever, chills, sore throat, rhinorrhea, productive cough, wheezing or hemoptysis.  No chest pain, palpitations, diaphoresis, PND, orthopnea or pitting edema lower extremities.   Hospital course:  * DKA (diabetic ketoacidosis) (HCC)-resolved as of 03/09/2021 Possibly related to bent pump needle. Patient is managed on an insulin pump as an outpatient. Anion gap closed with improved acidosis.  Mild protein malnutrition (HCC) Dietitian consulted but unable to see prior to discharge. Outpatient referral per PCP.  Hyperbilirubinemia Transient. Resolved.  AKI (acute kidney injury) (HCC) Patient's baseline creatinine is about 1.7-2. Creatinine of 3.38 on admission secondary to dehydration from DKA  and hyperglycemia. 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  Patient is managed on an insulin pump as an outpatient. Poorly controlled with hyperglycemia.  Normocytic anemia Chronic issue. Unknown etiology. Baseline of about 9-10. Downward drift this admission but stable. Patient is premenopausal. Patient will need outpatient follow-up and management.  Leukocytosis Reactive. Improved prior to discharge.  Hyperkalemia-resolved as of 03/09/2021 Mild. Likely falsely elevated in setting of DKA. Treated with IV fluids and insulin. Resolved.   Discharge Instructions   Allergies as of 03/09/2021       Reactions   Cephalexin Anaphylaxis   Penicillins Hives, Rash   Has patient had a PCN reaction causing immediate rash, facial/tongue/throat swelling, SOB or lightheadedness with hypotension: Yes Has patient had a PCN reaction causing severe rash involving mucus membranes or skin necrosis: No Has patient had a PCN reaction that required hospitalization: Yes Has patient had a PCN reaction occurring within the last 10 years: No Spoke with pt - childhood hives told by mom, tried no pcns since, doesn't remember reaction herself    Benadryl [diphenhydramine] Itching   Doxycycline Itching        Medication List     TAKE these medications    albuterol 108 (90 Base) MCG/ACT inhaler Commonly known as: VENTOLIN HFA Inhale 2 puffs into the lungs every 4 (four) hours as needed for wheezing or shortness of breath.   amLODipine 5 MG tablet Commonly known as: NORVASC Take 2 tablets (10 mg total) by mouth daily. Start taking on: March 10, 2021   blood glucose meter kit and supplies Kit Dispense based on patient and insurance preference. Use up to four times daily as directed. (FOR ICD-9 250.00,

## 2021-03-09 NOTE — Discharge Instructions (Addendum)
Ashley Freeman,  You were in the hospital with DKA. This has resolved with insulin. Please resume your home insulin. You also had injury to your kidneys. This was likely related to significant dehydration but has also improved. You will need to see your PCP next week for repeat lab work. Before you were discharged, your blood pressure was very high. I have started you on a blood pressure medication called amlodipine but you will need to see your primary care doctor as well about this issue. Please buy a blood pressure monitor. Your blood pressure goal should be less than 130/80 but this may not be achievable in the short term. If you notice that your blood pressure is equal or less than 100/60, please do not take the amlodipine.

## 2021-03-09 NOTE — Plan of Care (Signed)
Plan of care reviewed and discussed with the patient. 

## 2021-03-17 ENCOUNTER — Ambulatory Visit: Payer: Medicaid Other | Admitting: Internal Medicine

## 2021-03-20 ENCOUNTER — Other Ambulatory Visit: Payer: Self-pay

## 2021-03-20 ENCOUNTER — Inpatient Hospital Stay
Admission: EM | Admit: 2021-03-20 | Discharge: 2021-03-24 | DRG: 637 | Disposition: A | Discharge status: Home / Self Care | Payer: Medicaid Other | Attending: Student in an Organized Health Care Education/Training Program | Admitting: Student in an Organized Health Care Education/Training Program

## 2021-03-20 ENCOUNTER — Encounter: Payer: Self-pay | Admitting: Internal Medicine

## 2021-03-20 ENCOUNTER — Emergency Department: Payer: Medicaid Other

## 2021-03-20 DIAGNOSIS — J9601 Acute respiratory failure with hypoxia: Secondary | ICD-10-CM | POA: Diagnosis present

## 2021-03-20 DIAGNOSIS — I129 Hypertensive chronic kidney disease with stage 1 through stage 4 chronic kidney disease, or unspecified chronic kidney disease: Secondary | ICD-10-CM | POA: Diagnosis present

## 2021-03-20 DIAGNOSIS — N179 Acute kidney failure, unspecified: Secondary | ICD-10-CM | POA: Diagnosis present

## 2021-03-20 DIAGNOSIS — E1010 Type 1 diabetes mellitus with ketoacidosis without coma: Secondary | ICD-10-CM | POA: Diagnosis present

## 2021-03-20 DIAGNOSIS — Z79899 Other long term (current) drug therapy: Secondary | ICD-10-CM

## 2021-03-20 DIAGNOSIS — Z888 Allergy status to other drugs, medicaments and biological substances status: Secondary | ICD-10-CM

## 2021-03-20 DIAGNOSIS — Z833 Family history of diabetes mellitus: Secondary | ICD-10-CM

## 2021-03-20 DIAGNOSIS — E860 Dehydration: Secondary | ICD-10-CM | POA: Diagnosis present

## 2021-03-20 DIAGNOSIS — Z880 Allergy status to penicillin: Secondary | ICD-10-CM | POA: Diagnosis not present

## 2021-03-20 DIAGNOSIS — Z794 Long term (current) use of insulin: Secondary | ICD-10-CM | POA: Diagnosis not present

## 2021-03-20 DIAGNOSIS — E785 Hyperlipidemia, unspecified: Secondary | ICD-10-CM | POA: Diagnosis present

## 2021-03-20 DIAGNOSIS — N1831 Chronic kidney disease, stage 3a: Secondary | ICD-10-CM | POA: Diagnosis present

## 2021-03-20 DIAGNOSIS — E1022 Type 1 diabetes mellitus with diabetic chronic kidney disease: Secondary | ICD-10-CM | POA: Diagnosis present

## 2021-03-20 DIAGNOSIS — J101 Influenza due to other identified influenza virus with other respiratory manifestations: Secondary | ICD-10-CM | POA: Diagnosis present

## 2021-03-20 DIAGNOSIS — D638 Anemia in other chronic diseases classified elsewhere: Secondary | ICD-10-CM | POA: Diagnosis present

## 2021-03-20 DIAGNOSIS — U071 COVID-19: Secondary | ICD-10-CM | POA: Diagnosis present

## 2021-03-20 DIAGNOSIS — Z881 Allergy status to other antibiotic agents status: Secondary | ICD-10-CM

## 2021-03-20 DIAGNOSIS — E875 Hyperkalemia: Secondary | ICD-10-CM | POA: Diagnosis present

## 2021-03-20 DIAGNOSIS — J111 Influenza due to unidentified influenza virus with other respiratory manifestations: Secondary | ICD-10-CM

## 2021-03-20 LAB — BLOOD GAS, VENOUS
Acid-base deficit: 24.9 mmol/L — ABNORMAL HIGH (ref 0.0–2.0)
Bicarbonate: 2.7 mmol/L — ABNORMAL LOW (ref 20.0–28.0)
O2 Saturation: 87.1 %
Patient temperature: 37
pCO2, Ven: <19 mmHg — CL (ref 44.0–60.0)
pH, Ven: 7.09 — CL (ref 7.250–7.430)
pO2, Ven: 74 mmHg — ABNORMAL HIGH (ref 32.0–45.0)

## 2021-03-20 LAB — CBC
HCT: 32.9 % — ABNORMAL LOW (ref 36.0–46.0)
Hemoglobin: 9 g/dL — ABNORMAL LOW (ref 12.0–15.0)
MCH: 26 pg (ref 26.0–34.0)
MCHC: 27.4 g/dL — ABNORMAL LOW (ref 30.0–36.0)
MCV: 95.1 fL (ref 80.0–100.0)
Platelets: 400 10*3/uL (ref 150–400)
RBC: 3.46 MIL/uL — ABNORMAL LOW (ref 3.87–5.11)
RDW: 14.3 % (ref 11.5–15.5)
WBC: 29.3 10*3/uL — ABNORMAL HIGH (ref 4.0–10.5)
nRBC: 0 % (ref 0.0–0.2)

## 2021-03-20 LAB — RESP PANEL BY RT-PCR (FLU A&B, COVID) ARPGX2
Influenza A by PCR: POSITIVE — AB
Influenza B by PCR: NEGATIVE
SARS Coronavirus 2 by RT PCR: POSITIVE — AB

## 2021-03-20 LAB — BETA-HYDROXYBUTYRIC ACID: Beta-Hydroxybutyric Acid: >8 mmol/L — ABNORMAL HIGH (ref 0.05–0.27)

## 2021-03-20 LAB — HCG, QUANTITATIVE, PREGNANCY: hCG, Beta Chain, Quant, S: 1 m[IU]/mL (ref <5)

## 2021-03-20 LAB — LACTIC ACID, PLASMA: Lactic Acid, Venous: 3.8 mmol/L (ref 0.5–1.9)

## 2021-03-20 MED ORDER — CALCIUM GLUCONATE-NACL 1-0.675 GM/50ML-% IV SOLN
1.0000 g | Freq: Once | INTRAVENOUS | Status: AC
  Administered 2021-03-21: 1000 mg via INTRAVENOUS
  Filled 2021-03-20: qty 50

## 2021-03-20 MED ORDER — IPRATROPIUM-ALBUTEROL 20-100 MCG/ACT IN AERS: 1.0000 | INHALATION_SPRAY | Freq: Four times a day (QID) | RESPIRATORY_TRACT | Status: DC

## 2021-03-20 MED ORDER — GUAIFENESIN-DM 100-10 MG/5ML PO SYRP: 10.0000 mL | ORAL_SOLUTION | ORAL | Status: DC | PRN

## 2021-03-20 MED ORDER — OSELTAMIVIR PHOSPHATE 75 MG PO CAPS
75.0000 mg | ORAL_CAPSULE | Freq: Once | ORAL | Status: DC
  Filled 2021-03-20: qty 1

## 2021-03-20 MED ORDER — ENOXAPARIN SODIUM 30 MG/0.3ML IJ SOSY: 30.0000 mg | PREFILLED_SYRINGE | INTRAMUSCULAR | Status: DC

## 2021-03-20 MED ORDER — SODIUM CHLORIDE 0.9 % IV BOLUS
1000.0000 mL | Freq: Once | INTRAVENOUS | Status: AC
  Administered 2021-03-20: 1000 mL via INTRAVENOUS

## 2021-03-20 MED ORDER — DEXTROSE 50 % IV SOLN
0.0000 mL | INTRAVENOUS | Status: DC | PRN
  Administered 2021-03-22 – 2021-03-23 (×2): 50 mL via INTRAVENOUS
  Filled 2021-03-20 (×2): qty 50

## 2021-03-20 MED ORDER — ASCORBIC ACID 500 MG PO TABS
500.0000 mg | ORAL_TABLET | Freq: Every day | ORAL | Status: DC
  Administered 2021-03-22 – 2021-03-24 (×3): 500 mg via ORAL
  Filled 2021-03-20 (×3): qty 1

## 2021-03-20 MED ORDER — DEXTROSE IN LACTATED RINGERS 5 % IV SOLN: INTRAVENOUS | Status: DC

## 2021-03-20 MED ORDER — OSELTAMIVIR PHOSPHATE 30 MG PO CAPS
30.0000 mg | ORAL_CAPSULE | Freq: Two times a day (BID) | ORAL | Status: DC
  Filled 2021-03-20: qty 1

## 2021-03-20 MED ORDER — SODIUM BICARBONATE 8.4 % IV SOLN
50.0000 meq | Freq: Once | INTRAVENOUS | Status: AC
  Administered 2021-03-20: 50 meq via INTRAVENOUS
  Filled 2021-03-20: qty 50

## 2021-03-20 MED ORDER — SODIUM CHLORIDE 0.9 % IV SOLN
200.0000 mg | Freq: Once | INTRAVENOUS | Status: AC
  Administered 2021-03-21: 200 mg via INTRAVENOUS
  Filled 2021-03-20: qty 200

## 2021-03-20 MED ORDER — CALCIUM GLUCONATE-NACL 1-0.675 GM/50ML-% IV SOLN
1.0000 g | Freq: Once | INTRAVENOUS | Status: AC
  Administered 2021-03-20: 1000 mg via INTRAVENOUS
  Filled 2021-03-20: qty 50

## 2021-03-20 MED ORDER — ZINC SULFATE 220 (50 ZN) MG PO CAPS
220.0000 mg | ORAL_CAPSULE | Freq: Every day | ORAL | Status: DC
  Administered 2021-03-22 – 2021-03-24 (×3): 220 mg via ORAL
  Filled 2021-03-20 (×3): qty 1

## 2021-03-20 MED ORDER — SODIUM CHLORIDE 0.9 % IV SOLN
100.0000 mg | Freq: Every day | INTRAVENOUS | Status: AC
  Administered 2021-03-21 – 2021-03-24 (×4): 100 mg via INTRAVENOUS
  Filled 2021-03-20: qty 100
  Filled 2021-03-20: qty 20
  Filled 2021-03-20 (×3): qty 100

## 2021-03-20 MED ORDER — HYDROCOD POLST-CPM POLST ER 10-8 MG/5ML PO SUER
5.0000 mL | Freq: Two times a day (BID) | ORAL | Status: DC | PRN
  Administered 2021-03-22 – 2021-03-23 (×2): 5 mL via ORAL
  Filled 2021-03-20 (×2): qty 5

## 2021-03-20 MED ORDER — ALBUTEROL (5 MG/ML) CONTINUOUS INHALATION SOLN
15.0000 mg | INHALATION_SOLUTION | Freq: Once | RESPIRATORY_TRACT | Status: DC
  Filled 2021-03-20 (×2): qty 20

## 2021-03-20 MED ORDER — DOCUSATE SODIUM 100 MG PO CAPS: 100.0000 mg | ORAL_CAPSULE | Freq: Two times a day (BID) | ORAL | Status: DC | PRN

## 2021-03-20 MED ORDER — POLYETHYLENE GLYCOL 3350 17 G PO PACK: 17.0000 g | PACK | Freq: Every day | ORAL | Status: DC | PRN

## 2021-03-20 MED ORDER — INSULIN REGULAR(HUMAN) IN NACL 100-0.9 UT/100ML-% IV SOLN
INTRAVENOUS | Status: DC
  Administered 2021-03-21: 6 [IU]/h via INTRAVENOUS
  Administered 2021-03-21: 8 [IU]/h via INTRAVENOUS
  Filled 2021-03-20 (×2): qty 100

## 2021-03-20 MED ORDER — LACTATED RINGERS IV SOLN: INTRAVENOUS | Status: DC

## 2021-03-20 MED ORDER — INSULIN ASPART 100 UNIT/ML IV SOLN
10.0000 [IU] | Freq: Once | INTRAVENOUS | Status: AC
  Administered 2021-03-21: 10 [IU] via INTRAVENOUS
  Filled 2021-03-20: qty 0.1

## 2021-03-20 NOTE — Progress Notes (Signed)
PHARMACY NOTE:  ANTIMICROBIAL RENAL DOSAGE ADJUSTMENT  Current antimicrobial regimen includes a mismatch between antimicrobial dosage and estimated renal function.  As per policy approved by the Pharmacy & Therapeutics and Medical Executive Committees, the antimicrobial dosage will be adjusted accordingly.  Current antimicrobial dosage:  Tamiflu 30 mg PO BID   Indication: Flu tx   Renal Function:  Estimated Creatinine Clearance: 37.9 mL/min (A) (by C-G formula based on SCr of 1.83 mg/dL (H)). []      On intermittent HD, scheduled: []      On CRRT    Antimicrobial dosage has been changed to:  Tamiflu 75 mg PO X 1 for now followed by tamiflu 30 mg PO BID to start 11/22 @ 1000.   Additional comments:   Thank you for allowing pharmacy to be a part of this patient's care.  Orene Desanctis, [facility] 03/20/2021 11:48 PM

## 2021-03-20 NOTE — ED Provider Notes (Signed)
[facility] Emergency Department Provider Note   ____________________________________________   Event Date/Time   First MD Initiated Contact with Patient 03/20/21 2109     (approximate)  I have reviewed the triage vital signs and the nursing notes.   HISTORY  Chief Complaint High blood sugar  EM caveat: Patient not feeling well, answers limited questions.  History also provided by EMS and obtained from paramedics    HPI Ashley Freeman is a 30 y.o. female who EMS were called for concerns of weakness and high blood sugar.  Patient evidently has a history of repeated episodes of DKA with similar presentation  Patient's family on scene reports she got a new glucometer and has not been able to use it recently.  She is a diabetic.  She has been having increasing fatigue and weakness now.  Patient reports she does not feel well.  Reports that she is feels uncomfortable hard for describe where.  She does report her blood sugar meter was not working  Unclear if compliant  No known preceding illness such as fever  Past Medical History:  Diagnosis Date   Abscess, gluteal, right 08/24/2013   AKI (acute kidney injury) ([place]) 07/26/2014   Anemia 02/19/2012   Bartholin's gland abscess 09/19/2013   BV (bacterial vaginosis) 11/24/2015   Diabetes mellitus type I ([place]) 2001   Diagnosed at age 50 ; Type I   Diarrhea 05/30/2016   DKA (diabetic ketoacidoses) 08/19/2013   Also in 2018   Gonorrhea 08/2011   Treated in 09/2011   History of trichomoniasis 05/31/2016   Hyperlipidemia 03/28/2016   Sepsis ([place]) 09/19/2013    Patient Active Problem List   Diagnosis Date Noted   Hyperbilirubinemia 03/07/2021   Mild protein malnutrition ([place]) 03/07/2021   DKA, type 1 ([place]) 12/08/2020   AKI (acute kidney injury) ([place]) 09/17/2020   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/17/2020   Type 1 diabetes mellitus with stage 3a chronic kidney disease ([place])  07/18/2020   History of pre-eclampsia 10/05/2019   Transaminitis 10/05/2019   Elevated serum creatinine 10/05/2019   Bilateral leg edema 35/68/6168   Systolic ejection murmur 37/29/0211   Shortness of breath 08/03/2019   Type 1 diabetes mellitus with hyperglycemia ([place]) 07/24/2019   Diabetic retinopathy ([place]) 07/02/2019   Proteinuria due to type 1 diabetes mellitus ([place]) 05/21/2019   Diabetic neuropathy ([place]) 01/16/2016   Leukocytosis 02/19/2012   Normocytic anemia 02/19/2012   Diabetes mellitus type 1, uncontrolled, with complications 15/52/0802    Past Surgical History:  Procedure Laterality Date   CESAREAN SECTION N/A 10/05/2019   Procedure: CESAREAN SECTION;  Surgeon: Aletha Halim, MD;  Location: [facility];  Service: Obstetrics;  Laterality: N/A;   INCISION AND DRAINAGE ABSCESS Left 09/28/2019   Procedure: INCISION AND DRAINAGE VULVAR ABCESS;  Surgeon: [name], MD;  Location: [place];  Service: Gynecology;  Laterality: Left;   INCISION AND DRAINAGE PERIRECTAL ABSCESS Right 08/18/2013   Procedure: IRRIGATION AND DEBRIDEMENT GLUTEAL ABSCESS;  Surgeon: Ralene Ok, MD;  Location: [place];  Service: General;  Laterality: Right;   INCISION AND DRAINAGE PERIRECTAL ABSCESS Right 09/19/2013   Procedure: IRRIGATION AND DEBRIDEMENT RIGHT GLUTEAL AND LABIAL ABSCESSES;  Surgeon: Ralene Ok, MD;  Location: [place];  Service: General;  Laterality: Right;   INCISION AND DRAINAGE PERIRECTAL ABSCESS Right 09/24/2013   Procedure: IRRIGATION AND DEBRIDEMENT PERIRECTAL ABSCESS;  Surgeon: Gwenyth Ober, MD;  Location: [place];  Service: General;  Laterality:  Right;    Prior to Admission medications   Medication Sig Start Date End Date Taking? Authorizing Provider  albuterol (VENTOLIN HFA) 108 (90 Base) MCG/ACT inhaler Inhale 2 puffs into the lungs every 4 (four) hours as needed for wheezing or shortness of breath. 08/09/20   [provider]  amLODipine (NORVASC) 5 MG tablet Take 2  tablets (10 mg total) by mouth daily. 03/10/21   Mariel Aloe, MD  blood glucose meter kit and supplies KIT Dispense based on patient and insurance preference. Use up to four times daily as directed. (FOR ICD-9 250.00, 250.01). 05/25/17   Isla Pence, MD  Blood Pressure Monitoring (BLOOD PRESSURE KIT) DEVI 1 Device by Does not apply route as needed. 05/06/19   Anyanwu, Sallyanne Havers, MD  Continuous Blood Gluc Sensor (DEXCOM G6 SENSOR) MISC Inject 1 Device into the skin as directed. 12/16/20   Shamleffer, Melanie Crazier, MD  Continuous Blood Gluc Transmit (DEXCOM G6 TRANSMITTER) MISC Inject 1 Device into the skin as directed. Use to check blood sugar daily 12/16/20   Shamleffer, Melanie Crazier, MD  insulin aspart (NOVOLOG) 100 UNIT/ML FlexPen 0-9 Units, Subcutaneous, 3 times daily with meals, CBG < 70: Implement Hypoglycemia measures CBG 70 - 120: 0 units CBG 121 - 150: 1 unit CBG 151 - 200: 2 units CBG 201 - 250: 3 units CBG 251 - 300: 5 units CBG 301 - 350: 7 units CBG 351 - 400: 9 units CBG > 400: call MD Patient taking differently: Inject 0-9 Units into the skin See admin instructions. 0-9 Units, Subcutaneous, 3 times daily with meals, CBG < 70: Implement Hypoglycemia measures CBG 70 - 120: 0 units CBG 121 - 150: 1 unit CBG 151 - 200: 2 units CBG 201 - 250: 3 units CBG 251 - 300: 5 units CBG 301 - 350: 7 units CBG 351 - 400: 9 units CBG > 400: call MD. Using pump. 01/30/21   Shamleffer, Melanie Crazier, MD  Insulin Disposable Pump (OMNIPOD 5 G6 INTRO, GEN 5,) KIT 1 Device by Does not apply route every 3 (three) days. Patient taking differently: 1 Device by Does not apply route every 3 (three) days. 100 units for 72 hours 12/16/20   Shamleffer, Melanie Crazier, MD  Insulin Disposable Pump (OMNIPOD 5 G6 POD, GEN 5,) MISC 1 Device by Does not apply route every 3 (three) days. Patient not taking: No sig reported 12/16/20   Shamleffer, Melanie Crazier, MD  Insulin Pen Needle 32G X 4 MM MISC Use as directed  10/07/20   Ghimire, Henreitta Leber, MD  lamoTRIgine (LAMICTAL) 100 MG tablet Take 100 mg by mouth daily. 02/28/21   [provider]  Ferrous Sulfate (IRON) 325 (65 Fe) MG TABS Take 1 tablet (325 mg total) by mouth every other day. Patient not taking: Reported on 07/25/2020 06/17/19 07/25/20  Chauncey Mann, MD  hydrochlorothiazide (HYDRODIURIL) 25 MG tablet Take 1 tablet (25 mg total) by mouth daily. Patient not taking: No sig reported 10/08/19 07/25/20  Constant, Peggy, MD  promethazine (PHENERGAN) 25 MG tablet Take 1 tablet (25 mg total) by mouth every 8 (eight) hours as needed for nausea or vomiting (if zofran fails). Patient not taking: Reported on 10/06/2020 08/25/20 10/06/20  Varney Biles, MD    Allergies Cephalexin, Penicillins, Benadryl [diphenhydramine], and Doxycycline  Family History  Problem Relation Age of Onset   Asthma Mother    Carpal tunnel syndrome Mother    Gout Father    Diabetes Paternal Grandmother  Anesthesia problems Neg Hx     Social History Social History   Tobacco Use   Smoking status: Never   Smokeless tobacco: Never  Vaping Use   Vaping Use: Never used  Substance Use Topics   Alcohol use: Not Currently   Drug use: No    Review of Systems  EM Caveat    ____________________________________________   PHYSICAL EXAM:  VITAL SIGNS: ED Triage Vitals  Enc Vitals Group     BP 03/20/21 2108 139/78     Pulse Rate 03/20/21 2108 (!) 104     Resp 03/20/21 2108 (!) 28     Temp --      Temp src --      SpO2 03/20/21 2107 100 %     Weight 03/20/21 2110 123 lb 7.3 oz (56 kg)     Height 03/20/21 2110 '5\' 3"'  (1.6 m)     Head Circumference --      Peak Flow --      Pain Score --      Pain Loc --      Pain Edu? --      Excl. in [place]? --     Constitutional: Alert and oriented.  Ill-appearing, tachypneic fatigued but answers questions appropriately but limited seems very generally uncomfortable and feeling extremely fatigued. Eyes: Conjunctivae are  normal. Head: Atraumatic. Nose: No congestion/rhinnorhea. Mouth/Throat: Mucous membranes are dry Neck: No stridor.  Cardiovascular: Moderately tachycardic rate, regular rhythm. Grossly normal heart sounds.  Good peripheral circulation. Respiratory: Moderate tachypnea no retractions. Lungs CTAB.   Gastrointestinal: Soft and nontender. No distention.  Some noted urinary incontinence Musculoskeletal: No lower extremity tenderness nor edema. Neurologic:  Normal speech and language.  Curled up on her side, does move up around and use all extremities but seems generally fatigued and weak Skin:  Skin is warm, dry and intact. No rash noted. Psychiatric: Mood and affect are somewhat anxious  ____________________________________________   LABS (all labs ordered are listed, but only abnormal results are displayed)  Labs Reviewed  RESP PANEL BY RT-PCR (FLU A&B, COVID) ARPGX2 - Abnormal; Notable for the following components:      Result Value   SARS Coronavirus 2 by RT PCR POSITIVE (*)    Influenza A by PCR POSITIVE (*)    All other components within normal limits  CBC - Abnormal; Notable for the following components:   WBC 29.3 (*)    RBC 3.46 (*)    Hemoglobin 9.0 (*)    HCT 32.9 (*)    MCHC 27.4 (*)    All other components within normal limits  BETA-HYDROXYBUTYRIC ACID - Abnormal; Notable for the following components:   Beta-Hydroxybutyric Acid >8.00 (*)    All other components within normal limits  LACTIC ACID, PLASMA - Abnormal; Notable for the following components:   Lactic Acid, Venous 3.8 (*)    All other components within normal limits  BLOOD GAS, VENOUS - Abnormal; Notable for the following components:   pH, Ven 7.09 (*)    pCO2, Ven <19.0 (*)    pO2, Ven 74.0 (*)    Bicarbonate 2.7 (*)    Acid-base deficit 24.9 (*)    All other components within normal limits  CULTURE, BLOOD (ROUTINE X 2)  CULTURE, BLOOD (ROUTINE X 2)  URINE CULTURE  HCG, QUANTITATIVE, PREGNANCY   URINALYSIS, ROUTINE W REFLEX MICROSCOPIC  BASIC METABOLIC PANEL  PROCALCITONIN  URINE DRUG SCREEN, QUALITATIVE ([facility] ONLY)  LIPASE, BLOOD  COMPREHENSIVE METABOLIC PANEL  CBC  BASIC METABOLIC PANEL  MAGNESIUM  PHOSPHORUS  CBG MONITORING, ED  CBG MONITORING, ED  CBG MONITORING, ED  CBG MONITORING, ED  CBG MONITORING, ED  CBG MONITORING, ED  CBG MONITORING, ED  CBG MONITORING, ED  CBG MONITORING, ED  POC URINE PREG, ED  TROPONIN I (HIGH SENSITIVITY)   ____________________________________________  EKG  Reviewed interpreted 2110 Heart rate 100 QRS 120 QTc 500 Sinus tachycardia.  Occasional PVC.  Peaked appearance of T waves.  No obvious evidence of acute ischemia but nonspecific T wave abnormality is noted and findings that may be compatible with hyperkalemia.  Mild prolongation of QT interval also apparent.  The QRS complex does not appear broadly widened but slightly greater than 120 ____________________________________________  RADIOLOGY  DG Chest Portable 1 View  Result Date: 03/20/2021 CLINICAL DATA:  Acidosis.  COVID positive. EXAM: PORTABLE CHEST 1 VIEW COMPARISON:  Radiograph 03/07/2021 FINDINGS: The cardiomediastinal contours are normal. The lungs are clear. Pulmonary vasculature is normal. No consolidation, pleural effusion, or pneumothorax. No acute osseous abnormalities are seen. IMPRESSION: No acute chest findings. Electronically Signed   By: Keith Rake M.D.   On: 03/20/2021 22:57    Chest x-ray reviewed negative for acute ____________________________________________   PROCEDURES  Procedure(s) performed: None  Procedures  Critical Care performed: Yes, see critical care note(s)  CRITICAL CARE Performed by: Delman Kitten   Total critical care time: 40 minutes  Critical care time was exclusive of separately billable procedures and treating other patients.  Critical care was necessary to treat or prevent imminent or life-threatening  deterioration.  Critical care was time spent personally by me on the following activities: development of treatment plan with patient and/or surrogate as well as nursing, discussions with consultants, evaluation of patient's response to treatment, examination of patient, obtaining history from patient or surrogate, ordering and performing treatments and interventions, ordering and review of laboratory studies, ordering and review of radiographic studies, pulse oximetry and re-evaluation of patient's condition.  ____________________________________________   INITIAL IMPRESSION / ASSESSMENT AND PLAN / ED COURSE  Pertinent labs & imaging results that were available during my care of the patient were reviewed by me and considered in my medical decision making (see chart for details).   Patient presents EMS reports tachycardia low end-tidal CO2 high blood sugar reading on 2 separate reads 1 with fire 1 with EMS and concern for possible DKA.  My initial impression on seeing the patient is this likely is a presentation of DKA but confirmatory testing is required.  There is no obvious evidence to support bacterial superinfection based on the clinical history and initial assessment.  We will begin acute fluid resuscitation.  Await labs including potassium.  Patient appears acutely acidotic in need of resuscitative measures at this time.  She is protecting her airway, mental status alert but fatigued.  No evidence of comatose behavior.  Clinical Course as of 03/20/21 2333  Mon Mar 20, 2021  2130 EKG reviewed, T waves appear concernedly peaked.  Is unclear what the patient's potassium and electrolytes are at this time, however given her clinical presentation and what appears to be a peaked appearance of her T waves we will treat for potential hyperkalemia as I am highly concerned about acute life-threatening process.  1 amp bicarb and 1 g calcium gluconate ordered.  Await potassium stat order which is  currently being processed [MQ]  2238 I attempted to call emergency contact [facility], phone rings no answer.  No voicemail available at this time [MQ]  2239 Patient presents COVID and influenza positivity.  Low-grade temperature noted.  May be a precipitating factor to her DKA.  Chest x-ray ordered.  Is unclear the chronicity of either the COVID or influenza-like illness at this time [MQ]  2318 Ongoing ED care assigned to Dr. Starleen Blue.  I have called and patient excepted to ICU level service by Rufina Falco.  We are currently pending remainder of the metabolic panel including potassium.  Member and stabilizing calcium as well as 1 amp of bicarb been given to temporize the concern for possible hyperkalemia however also in her clinical setting acknowledged that potentially her potassium could be low in the setting of DKA and at this point I do not wish to initiate insulin until we have confirmation of her additional electrolytes which are currently being run in the lab [MQ]  2319 Discussed with ICU practitioner, no clear evidence of bacterial infection at this time.  Positive for viral infection.  Certainly sepsis remains on the differential, more awaiting a procalcitonin urinalysis and further evaluation and consideration of potentially starting antibiotics though given her positive COVID and influenza testing this may also be a reasonable cause for her leukocytosis in setting of DKA as well which may be reactive. [MQ]    Clinical Course User Index [MQ] Delman Kitten, MD     ____________________________________________   FINAL CLINICAL IMPRESSION(S) / ED DIAGNOSES  Final diagnoses:  [id number]  Diabetic ketoacidosis without coma associated with type 1 diabetes mellitus ([place])  Influenza  Above are provisional diagnosis, the patient's care is ongoing in the ED at the time of signout to my partner Dr. Starleen Blue and patient is pending admission to the ICU      Note:  This document was  prepared using Dragon voice recognition software and may include unintentional dictation errors       Delman Kitten, MD 03/20/21 2334

## 2021-03-21 LAB — LACTIC ACID, PLASMA
Lactic Acid, Venous: 3.5 mmol/L (ref 0.5–1.9)
Lactic Acid, Venous: 3 mmol/L (ref 0.5–1.9)

## 2021-03-21 LAB — CBC WITH DIFFERENTIAL/PLATELET
Abs Immature Granulocytes: 2.42 10*3/uL — ABNORMAL HIGH (ref 0.00–0.07)
Basophils Absolute: 0.1 10*3/uL (ref 0.0–0.1)
Basophils Relative: 0 %
Eosinophils Absolute: 0.1 10*3/uL (ref 0.0–0.5)
Eosinophils Relative: 0 %
HCT: 21.5 % — ABNORMAL LOW (ref 36.0–46.0)
Hemoglobin: 6.8 g/dL — ABNORMAL LOW (ref 12.0–15.0)
Immature Granulocytes: 8 %
Lymphocytes Relative: 13 %
Lymphs Abs: 3.9 10*3/uL (ref 0.7–4.0)
MCH: 26.2 pg (ref 26.0–34.0)
MCHC: 31.6 g/dL (ref 30.0–36.0)
MCV: 82.7 fL (ref 80.0–100.0)
Monocytes Absolute: 1.7 10*3/uL — ABNORMAL HIGH (ref 0.1–1.0)
Monocytes Relative: 5 %
Neutro Abs: 23.4 10*3/uL — ABNORMAL HIGH (ref 1.7–7.7)
Neutrophils Relative %: 74 %
Platelets: 301 10*3/uL (ref 150–400)
RBC: 2.6 MIL/uL — ABNORMAL LOW (ref 3.87–5.11)
RDW: 13.9 % (ref 11.5–15.5)
Smear Review: NORMAL
WBC: 31.5 10*3/uL — ABNORMAL HIGH (ref 4.0–10.5)
nRBC: 0 % (ref 0.0–0.2)

## 2021-03-21 LAB — BASIC METABOLIC PANEL
Anion gap: 19 — ABNORMAL HIGH (ref 5–15)
Anion gap: 10 (ref 5–15)
Anion gap: 9 (ref 5–15)
Anion gap: 10 (ref 5–15)
Anion gap: 6 (ref 5–15)
BUN: 85 mg/dL — ABNORMAL HIGH (ref 6–20)
BUN: 83 mg/dL — ABNORMAL HIGH (ref 6–20)
BUN: 73 mg/dL — ABNORMAL HIGH (ref 6–20)
BUN: 69 mg/dL — ABNORMAL HIGH (ref 6–20)
BUN: 76 mg/dL — ABNORMAL HIGH (ref 6–20)
BUN: 68 mg/dL — ABNORMAL HIGH (ref 6–20)
BUN: 62 mg/dL — ABNORMAL HIGH (ref 6–20)
CO2: <7 mmol/L — ABNORMAL LOW (ref 22–32)
CO2: <7 mmol/L — ABNORMAL LOW (ref 22–32)
CO2: 11 mmol/L — ABNORMAL LOW (ref 22–32)
CO2: 18 mmol/L — ABNORMAL LOW (ref 22–32)
CO2: 19 mmol/L — ABNORMAL LOW (ref 22–32)
CO2: 18 mmol/L — ABNORMAL LOW (ref 22–32)
CO2: 20 mmol/L — ABNORMAL LOW (ref 22–32)
Calcium: 7.2 mg/dL — ABNORMAL LOW (ref 8.9–10.3)
Calcium: 7.5 mg/dL — ABNORMAL LOW (ref 8.9–10.3)
Calcium: 6.8 mg/dL — ABNORMAL LOW (ref 8.9–10.3)
Calcium: 7 mg/dL — ABNORMAL LOW (ref 8.9–10.3)
Calcium: 7.2 mg/dL — ABNORMAL LOW (ref 8.9–10.3)
Calcium: 7.2 mg/dL — ABNORMAL LOW (ref 8.9–10.3)
Calcium: 7.2 mg/dL — ABNORMAL LOW (ref 8.9–10.3)
Chloride: 91 mmol/L — ABNORMAL LOW (ref 98–111)
Chloride: 99 mmol/L (ref 98–111)
Chloride: 106 mmol/L (ref 98–111)
Chloride: 109 mmol/L (ref 98–111)
Chloride: 109 mmol/L (ref 98–111)
Chloride: 111 mmol/L (ref 98–111)
Chloride: 114 mmol/L — ABNORMAL HIGH (ref 98–111)
Creatinine, Ser: 4.41 mg/dL — ABNORMAL HIGH (ref 0.44–1.00)
Creatinine, Ser: 4.18 mg/dL — ABNORMAL HIGH (ref 0.44–1.00)
Creatinine, Ser: 3.58 mg/dL — ABNORMAL HIGH (ref 0.44–1.00)
Creatinine, Ser: 3.17 mg/dL — ABNORMAL HIGH (ref 0.44–1.00)
Creatinine, Ser: 3.23 mg/dL — ABNORMAL HIGH (ref 0.44–1.00)
Creatinine, Ser: 3.01 mg/dL — ABNORMAL HIGH (ref 0.44–1.00)
Creatinine, Ser: 2.81 mg/dL — ABNORMAL HIGH (ref 0.44–1.00)
GFR, Estimated: 13 mL/min — ABNORMAL LOW (ref >60)
GFR, Estimated: 14 mL/min — ABNORMAL LOW (ref >60)
GFR, Estimated: 17 mL/min — ABNORMAL LOW (ref >60)
GFR, Estimated: 20 mL/min — ABNORMAL LOW (ref >60)
GFR, Estimated: 19 mL/min — ABNORMAL LOW (ref >60)
GFR, Estimated: 21 mL/min — ABNORMAL LOW (ref >60)
GFR, Estimated: 23 mL/min — ABNORMAL LOW (ref >60)
Glucose, Bld: 1191 mg/dL (ref 70–99)
Glucose, Bld: 1075 mg/dL (ref 70–99)
Glucose, Bld: 897 mg/dL (ref 70–99)
Glucose, Bld: 571 mg/dL (ref 70–99)
Glucose, Bld: 318 mg/dL — ABNORMAL HIGH (ref 70–99)
Glucose, Bld: 315 mg/dL — ABNORMAL HIGH (ref 70–99)
Glucose, Bld: 125 mg/dL — ABNORMAL HIGH (ref 70–99)
Potassium: 7.1 mmol/L (ref 3.5–5.1)
Potassium: 5.1 mmol/L (ref 3.5–5.1)
Potassium: 3.9 mmol/L (ref 3.5–5.1)
Potassium: 3.8 mmol/L (ref 3.5–5.1)
Potassium: 3.4 mmol/L — ABNORMAL LOW (ref 3.5–5.1)
Potassium: 3.4 mmol/L — ABNORMAL LOW (ref 3.5–5.1)
Potassium: 3.5 mmol/L (ref 3.5–5.1)
Sodium: 123 mmol/L — ABNORMAL LOW (ref 135–145)
Sodium: 128 mmol/L — ABNORMAL LOW (ref 135–145)
Sodium: 136 mmol/L (ref 135–145)
Sodium: 137 mmol/L (ref 135–145)
Sodium: 137 mmol/L (ref 135–145)
Sodium: 139 mmol/L (ref 135–145)
Sodium: 140 mmol/L (ref 135–145)

## 2021-03-21 LAB — HEPATIC FUNCTION PANEL
ALT: 31 U/L (ref 0–44)
AST: 42 U/L — ABNORMAL HIGH (ref 15–41)
Albumin: 2.5 g/dL — ABNORMAL LOW (ref 3.5–5.0)
Alkaline Phosphatase: 74 U/L (ref 38–126)
Bilirubin, Direct: <0.1 mg/dL (ref 0.0–0.2)
Total Bilirubin: 1.5 mg/dL — ABNORMAL HIGH (ref 0.3–1.2)
Total Protein: 6 g/dL — ABNORMAL LOW (ref 6.5–8.1)

## 2021-03-21 LAB — PHOSPHORUS: Phosphorus: 5.9 mg/dL — ABNORMAL HIGH (ref 2.5–4.6)

## 2021-03-21 LAB — GLUCOSE, CAPILLARY
Glucose-Capillary: 112 mg/dL — ABNORMAL HIGH (ref 70–99)
Glucose-Capillary: 124 mg/dL — ABNORMAL HIGH (ref 70–99)
Glucose-Capillary: 142 mg/dL — ABNORMAL HIGH (ref 70–99)
Glucose-Capillary: 166 mg/dL — ABNORMAL HIGH (ref 70–99)
Glucose-Capillary: 217 mg/dL — ABNORMAL HIGH (ref 70–99)
Glucose-Capillary: 232 mg/dL — ABNORMAL HIGH (ref 70–99)
Glucose-Capillary: 306 mg/dL — ABNORMAL HIGH (ref 70–99)
Glucose-Capillary: 394 mg/dL — ABNORMAL HIGH (ref 70–99)
Glucose-Capillary: 417 mg/dL — ABNORMAL HIGH (ref 70–99)
Glucose-Capillary: 446 mg/dL — ABNORMAL HIGH (ref 70–99)
Glucose-Capillary: 483 mg/dL — ABNORMAL HIGH (ref 70–99)
Glucose-Capillary: 531 mg/dL (ref 70–99)
Glucose-Capillary: 583 mg/dL (ref 70–99)
Glucose-Capillary: >600 mg/dL (ref 70–99)
Glucose-Capillary: >600 mg/dL (ref 70–99)
Glucose-Capillary: >600 mg/dL (ref 70–99)
Glucose-Capillary: >600 mg/dL (ref 70–99)
Glucose-Capillary: >600 mg/dL (ref 70–99)
Glucose-Capillary: >600 mg/dL (ref 70–99)
Glucose-Capillary: >600 mg/dL (ref 70–99)
Glucose-Capillary: >600 mg/dL (ref 70–99)
Glucose-Capillary: >600 mg/dL (ref 70–99)
Glucose-Capillary: >600 mg/dL (ref 70–99)

## 2021-03-21 LAB — RESPIRATORY PANEL BY PCR
Adenovirus: NOT DETECTED
Bordetella Parapertussis: NOT DETECTED
Bordetella pertussis: NOT DETECTED
Chlamydophila pneumoniae: NOT DETECTED
Coronavirus 229E: NOT DETECTED
Coronavirus HKU1: NOT DETECTED
Coronavirus NL63: NOT DETECTED
Coronavirus OC43: NOT DETECTED
Influenza A H3: DETECTED — AB
Influenza B: NOT DETECTED
Metapneumovirus: NOT DETECTED
Mycoplasma pneumoniae: NOT DETECTED
Parainfluenza Virus 1: NOT DETECTED
Parainfluenza Virus 2: NOT DETECTED
Parainfluenza Virus 3: NOT DETECTED
Parainfluenza Virus 4: NOT DETECTED
Respiratory Syncytial Virus: NOT DETECTED
Rhinovirus / Enterovirus: NOT DETECTED

## 2021-03-21 LAB — CBG MONITORING, ED
Glucose-Capillary: >600 mg/dL (ref 70–99)
Glucose-Capillary: >600 mg/dL (ref 70–99)
Glucose-Capillary: >600 mg/dL (ref 70–99)

## 2021-03-21 LAB — MRSA NEXT GEN BY PCR, NASAL: MRSA by PCR Next Gen: NOT DETECTED

## 2021-03-21 LAB — COMPREHENSIVE METABOLIC PANEL
ALT: 31 U/L (ref 0–44)
AST: 41 U/L (ref 15–41)
Albumin: 2.7 g/dL — ABNORMAL LOW (ref 3.5–5.0)
Alkaline Phosphatase: 77 U/L (ref 38–126)
BUN: 78 mg/dL — ABNORMAL HIGH (ref 6–20)
CO2: <7 mmol/L — ABNORMAL LOW (ref 22–32)
Calcium: 7.1 mg/dL — ABNORMAL LOW (ref 8.9–10.3)
Chloride: 96 mmol/L — ABNORMAL LOW (ref 98–111)
Creatinine, Ser: 4 mg/dL — ABNORMAL HIGH (ref 0.44–1.00)
GFR, Estimated: 15 mL/min — ABNORMAL LOW (ref >60)
Glucose, Bld: 1095 mg/dL (ref 70–99)
Potassium: 6.7 mmol/L (ref 3.5–5.1)
Sodium: 124 mmol/L — ABNORMAL LOW (ref 135–145)
Total Bilirubin: 1.4 mg/dL — ABNORMAL HIGH (ref 0.3–1.2)
Total Protein: 6.3 g/dL — ABNORMAL LOW (ref 6.5–8.1)

## 2021-03-21 LAB — BLOOD GAS, VENOUS
Acid-base deficit: 23.8 mmol/L — ABNORMAL HIGH (ref 0.0–2.0)
Bicarbonate: 4.2 mmol/L — ABNORMAL LOW (ref 20.0–28.0)
O2 Saturation: 51.1 %
Patient temperature: 37
pCO2, Ven: <19 mmHg — CL (ref 44.0–60.0)
pH, Ven: 7.08 — CL (ref 7.250–7.430)
pO2, Ven: 40 mmHg (ref 32.0–45.0)

## 2021-03-21 LAB — BLOOD GAS, ARTERIAL
Acid-base deficit: 18.2 mmol/L — ABNORMAL HIGH (ref 0.0–2.0)
Bicarbonate: 7.8 mmol/L — ABNORMAL LOW (ref 20.0–28.0)
O2 Saturation: 71.2 %
Patient temperature: 37
pCO2 arterial: 19 mmHg — CL (ref 32.0–48.0)
pH, Arterial: 7.22 — ABNORMAL LOW (ref 7.350–7.450)
pO2, Arterial: 46 mmHg — ABNORMAL LOW (ref 83.0–108.0)

## 2021-03-21 LAB — D-DIMER, QUANTITATIVE
D-Dimer, Quant: 1.27 ug/mL-FEU — ABNORMAL HIGH (ref 0.00–0.50)
D-Dimer, Quant: 1.27 ug/mL-FEU — ABNORMAL HIGH (ref 0.00–0.50)

## 2021-03-21 LAB — C-REACTIVE PROTEIN
CRP: 0.7 mg/dL (ref <1.0)
CRP: <0.5 mg/dL (ref <1.0)

## 2021-03-21 LAB — URINE DRUG SCREEN, QUALITATIVE (ARMC ONLY)
Amphetamines, Ur Screen: NOT DETECTED
Barbiturates, Ur Screen: NOT DETECTED
Benzodiazepine, Ur Scrn: NOT DETECTED
Cannabinoid 50 Ng, Ur ~~LOC~~: NOT DETECTED
Cocaine Metabolite,Ur ~~LOC~~: NOT DETECTED
MDMA (Ecstasy)Ur Screen: NOT DETECTED
Methadone Scn, Ur: NOT DETECTED
Opiate, Ur Screen: NOT DETECTED
Phencyclidine (PCP) Ur S: NOT DETECTED
Tricyclic, Ur Screen: NOT DETECTED

## 2021-03-21 LAB — URINALYSIS, ROUTINE W REFLEX MICROSCOPIC
Bacteria, UA: NONE SEEN
Bilirubin Urine: NEGATIVE
Glucose, UA: >=500 mg/dL — AB
Ketones, ur: 20 mg/dL — AB
Leukocytes,Ua: NEGATIVE
Nitrite: NEGATIVE
Protein, ur: 100 mg/dL — AB
Specific Gravity, Urine: 1.016 (ref 1.005–1.030)
pH: 5 (ref 5.0–8.0)

## 2021-03-21 LAB — ABO/RH: ABO/RH(D): A POS

## 2021-03-21 LAB — LIPASE, BLOOD: Lipase: 26 U/L (ref 11–51)

## 2021-03-21 LAB — URINE CULTURE: Culture: NO GROWTH

## 2021-03-21 LAB — FIBRINOGEN: Fibrinogen: 492 mg/dL — ABNORMAL HIGH (ref 210–475)

## 2021-03-21 LAB — HEMOGLOBIN AND HEMATOCRIT, BLOOD
HCT: 20.4 % — ABNORMAL LOW (ref 36.0–46.0)
Hemoglobin: 7.1 g/dL — ABNORMAL LOW (ref 12.0–15.0)

## 2021-03-21 LAB — TYPE AND SCREEN
ABO/RH(D): A POS
Antibody Screen: NEGATIVE

## 2021-03-21 LAB — FERRITIN
Ferritin: 269 ng/mL (ref 11–307)
Ferritin: 308 ng/mL — ABNORMAL HIGH (ref 11–307)

## 2021-03-21 LAB — TROPONIN I (HIGH SENSITIVITY)
Troponin I (High Sensitivity): 122 ng/L (ref <18)
Troponin I (High Sensitivity): 220 ng/L (ref <18)

## 2021-03-21 LAB — LACTATE DEHYDROGENASE: LDH: 277 U/L — ABNORMAL HIGH (ref 98–192)

## 2021-03-21 LAB — MAGNESIUM: Magnesium: 1.8 mg/dL (ref 1.7–2.4)

## 2021-03-21 LAB — HEPATITIS B SURFACE ANTIGEN: Hepatitis B Surface Ag: NONREACTIVE

## 2021-03-21 LAB — PROCALCITONIN: Procalcitonin: 9.49 ng/mL

## 2021-03-21 MED ORDER — INSULIN GLARGINE-YFGN 100 UNIT/ML ~~LOC~~ SOLN
13.0000 [IU] | SUBCUTANEOUS | Status: DC
  Administered 2021-03-21: 13 [IU] via SUBCUTANEOUS
  Filled 2021-03-21 (×2): qty 0.13

## 2021-03-21 MED ORDER — LACTATED RINGERS IV BOLUS
1000.0000 mL | Freq: Once | INTRAVENOUS | Status: AC
  Administered 2021-03-21: 1000 mL via INTRAVENOUS

## 2021-03-21 MED ORDER — INSULIN ASPART 100 UNIT/ML IJ SOLN
0.0000 [IU] | INTRAMUSCULAR | Status: DC
  Administered 2021-03-21 – 2021-03-22 (×2): 2 [IU] via SUBCUTANEOUS
  Filled 2021-03-21: qty 1

## 2021-03-21 MED ORDER — ALBUTEROL SULFATE HFA 108 (90 BASE) MCG/ACT IN AERS
4.0000 | INHALATION_SPRAY | Freq: Four times a day (QID) | RESPIRATORY_TRACT | Status: DC
  Administered 2021-03-21 – 2021-03-24 (×14): 4 via RESPIRATORY_TRACT
  Filled 2021-03-21: qty 6.7

## 2021-03-21 MED ORDER — PANTOPRAZOLE SODIUM 40 MG IV SOLR
40.0000 mg | INTRAVENOUS | Status: DC
  Administered 2021-03-21 – 2021-03-24 (×4): 40 mg via INTRAVENOUS
  Filled 2021-03-21 (×4): qty 40

## 2021-03-21 MED ORDER — SODIUM BICARBONATE 8.4 % IV SOLN
INTRAVENOUS | Status: AC
  Filled 2021-03-21: qty 100

## 2021-03-21 MED ORDER — VANCOMYCIN VARIABLE DOSE PER UNSTABLE RENAL FUNCTION (PHARMACIST DOSING): Status: DC

## 2021-03-21 MED ORDER — IPRATROPIUM BROMIDE HFA 17 MCG/ACT IN AERS
1.0000 | INHALATION_SPRAY | Freq: Four times a day (QID) | RESPIRATORY_TRACT | Status: DC
  Administered 2021-03-21 – 2021-03-24 (×14): 1 via RESPIRATORY_TRACT
  Filled 2021-03-21: qty 12.9

## 2021-03-21 MED ORDER — LACTATED RINGERS IV SOLN: INTRAVENOUS | Status: DC

## 2021-03-21 MED ORDER — SODIUM BICARBONATE 8.4 % IV SOLN
100.0000 meq | Freq: Once | INTRAVENOUS | Status: AC
  Administered 2021-03-21: 100 meq via INTRAVENOUS

## 2021-03-21 MED ORDER — SODIUM CHLORIDE 0.9 % IV SOLN
2.0000 g | INTRAVENOUS | Status: DC
  Administered 2021-03-21: 2 g via INTRAVENOUS
  Filled 2021-03-21 (×2): qty 2

## 2021-03-21 MED ORDER — VANCOMYCIN HCL 1250 MG/250ML IV SOLN
1250.0000 mg | Freq: Once | INTRAVENOUS | Status: AC
  Administered 2021-03-21: 1250 mg via INTRAVENOUS
  Filled 2021-03-21: qty 250

## 2021-03-21 MED ORDER — HEPARIN SODIUM (PORCINE) 10000 UNIT/ML IJ SOLN
7500.0000 [IU] | Freq: Three times a day (TID) | INTRAMUSCULAR | Status: DC
  Administered 2021-03-21: 7500 [IU] via SUBCUTANEOUS
  Filled 2021-03-21 (×3): qty 1

## 2021-03-21 MED ORDER — HEPARIN SODIUM (PORCINE) 5000 UNIT/ML IJ SOLN
5000.0000 [IU] | Freq: Three times a day (TID) | INTRAMUSCULAR | Status: DC
  Administered 2021-03-21 – 2021-03-24 (×9): 5000 [IU] via SUBCUTANEOUS
  Filled 2021-03-21 (×9): qty 1

## 2021-03-21 MED ORDER — METRONIDAZOLE 500 MG/100ML IV SOLN
500.0000 mg | Freq: Two times a day (BID) | INTRAVENOUS | Status: DC
  Administered 2021-03-21: 500 mg via INTRAVENOUS
  Filled 2021-03-21 (×2): qty 100

## 2021-03-21 MED ORDER — SODIUM CHLORIDE 0.9 % IV BOLUS
1000.0000 mL | Freq: Once | INTRAVENOUS | Status: AC
Start: 2021-03-21 — End: 2021-03-21
  Administered 2021-03-21: 1000 mL via INTRAVENOUS

## 2021-03-21 MED ORDER — CHLORHEXIDINE GLUCONATE CLOTH 2 % EX PADS
6.0000 | MEDICATED_PAD | Freq: Every day | CUTANEOUS | Status: DC
  Administered 2021-03-21 – 2021-03-23 (×2): 6 via TOPICAL
  Filled 2021-03-21: qty 6

## 2021-03-21 MED ORDER — OSELTAMIVIR PHOSPHATE 30 MG PO CAPS
30.0000 mg | ORAL_CAPSULE | Freq: Every day | ORAL | Status: DC
  Administered 2021-03-22 – 2021-03-24 (×3): 30 mg via ORAL
  Filled 2021-03-21 (×4): qty 1

## 2021-03-21 NOTE — Progress Notes (Addendum)
Pharmacy Antibiotic Note  Ashley Freeman is a 30 y.o. female admitted on 03/20/2021 with sepsis.  Pharmacy has been consulted for Vancomycin , Cefepime dosing.  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  Baseline SrCr is ~ 1.8 - 2.3, most recenet SrCr = 4.41.   Plan: Cefepime 2 mg IV Q24H ordered to start on 11/22 @ 0200.  Vancomycin 1250 mg IV X1 loading dose on 11/22 @ 0408. Will dose by levels, current dosing interval calculates to q36-q48h hours from load. Will check random leval at 36hrs (11/23 @1600 ) IF creatinine does not improve significantly with AM labs on 11/23.  Will need to monitor SrCr and assess Vanc daily .    Height: 5\' 3"  (160 cm) Weight: 56 kg (123 lb 7.3 oz) IBW/kg (Calculated) : 52.4  Temp (24hrs), Avg:97.2 F (36.2 C), Min:94.9 F (34.9 C), Max:99.5 F (37.5 C)  Recent Labs  Lab 03/20/21 2110 03/20/21 2111 03/20/21 2234  WBC 29.3*  --   --   CREATININE  --   --  4.41*  LATICACIDVEN  --  3.8*  --     Estimated Creatinine Clearance: 15.7 mL/min (A) (by C-G formula based on SCr of 4.41 mg/dL (H)).    Allergies  Allergen Reactions   Cephalexin Anaphylaxis   Penicillins Hives and Rash    Has patient had a PCN reaction causing immediate rash, facial/tongue/throat swelling, SOB or lightheadedness with hypotension: Yes Has patient had a PCN reaction causing severe rash involving mucus membranes or skin necrosis: No Has patient had a PCN reaction that required hospitalization: Yes Has patient had a PCN reaction occurring within the last 10 years: No Spoke with pt - childhood hives told by mom, tried no pcns since, doesn't remember reaction herself    Benadryl [Diphenhydramine] Itching   Doxycycline Itching    Antimicrobials this admission: VAN/CFP/MTZ (11/22  >>  Tamiflu (11/22>> Remdesivir (11/21>>  Dose adjustments this admission: CTM and adjust PRN ISO AKI on CKD3  Microbiology results:  BCx: NG12h  UCx:  sent  MRSA PCR: negative 11/21:  RVP >> Flu A positive 11/21: Covid+  Thank you for allowing pharmacy to be a part of this patient's care.  Robbins,Jason D 03/21/2021 1:49 AM

## 2021-03-21 NOTE — Progress Notes (Signed)
Remdesivir - Pharmacy Brief Note   O:  ALT:  CXR:  SpO2: 100 % on Vent (?)   A/P:  Remdesivir 200 mg IVPB once followed by 100 mg IVPB daily x 4 days.   Ashley Freeman D 03/21/2021 1:57 AM

## 2021-03-21 NOTE — Procedures (Signed)
Central Venous Catheter Insertion Procedure Note  JULLIETTE FRENTZ  [id number]  02/01/1991  Date:03/21/21  Time:5:00 AM   Provider Performing:Jullisa Grigoryan A Toney Difatta   Procedure: Insertion of Non-tunneled Central Venous [phone]) with US guidance (14431)   Indication(s) Medication administration and Difficult access  Consent Risks of the procedure as well as the alternatives and risks of each were explained to the patient and/or caregiver.  Consent for the procedure was obtained and is signed in the bedside chart  Anesthesia Topical only with 1% lidocaine   Timeout Verified patient identification, verified procedure, site/side was marked, verified correct patient position, special equipment/implants available, medications/allergies/relevant history reviewed, required imaging and test results available.  Sterile Technique Maximal sterile technique including full sterile barrier drape, hand hygiene, sterile gown, sterile gloves, mask, hair covering, sterile ultrasound probe cover (if used).  Procedure Description Area of catheter insertion was cleaned with chlorhexidine and draped in sterile fashion.  With real-time ultrasound guidance a central venous catheter was placed into the left femoral vein. Nonpulsatile blood flow and easy flushing noted in all ports.  The catheter was sutured in place and sterile dressing applied.  Complications/Tolerance None; patient tolerated the procedure well. Chest X-ray is ordered to verify placement for internal jugular or subclavian cannulation.   Chest x-ray is not ordered for femoral cannulation.  EBL Minimal  Specimen(s) None   Rufina Falco, DNP, CCRN, FNP-C, AGACNP-BC Acute Care Nurse Practitioner  [facility] Pager: [phone] [place] [facility]

## 2021-03-21 NOTE — Consult Note (Signed)
PHARMACY CONSULT NOTE - FOLLOW UP  Pharmacy Consult for Electrolyte Monitoring and Replacement   Recent Labs: Potassium (mmol/L)  Date Value  03/21/2021 3.9  11/12/2013 4.2   Magnesium (mg/dL)  Date Value  03/21/2021 1.8  11/11/2013 1.5 (L)   Calcium (mg/dL)  Date Value  03/21/2021 6.8 (L)   Calcium, Total (mg/dL)  Date Value  11/12/2013 8.2 (L)   Albumin (g/dL)  Date Value  03/21/2021 2.5 (L)  09/08/2019 2.9 (L)  11/10/2013 3.9   Phosphorus (mg/dL)  Date Value  03/21/2021 5.9 (H)   Sodium (mmol/L)  Date Value  03/21/2021 136  09/08/2019 140  11/12/2013 137     Assessment: 30yo female w/ h/o T1DM (on insulin pump) presenting in DKA c/b sepsis (Flu-A positive; Covid positive) and AKI on CKD3. Pharmacy consulted for electrolyte mgmt.  Scr (BL 1.8-2.3)4.18>3.58 K: 7.1>3.9 Na (corrected for gluc): 149>155 Mg: 1.8   Goal of Therapy:  Lytes WNL  Plan:  Pt continues on insulin gtt & LR @125ml /h; AG: 19. No further repletion is needed at this time. Will CTM closely ISO DKA and AKI.  Lorna Dibble, PharmD, [facility] Clinical Pharmacist 03/21/2021 9:28 AM

## 2021-03-21 NOTE — Progress Notes (Signed)
Inpatient Diabetes Program Recommendations  AACE/ADA: New Consensus Statement on Inpatient Glycemic Control   Target Ranges:  Prepandial:   less than 140 mg/dL      Peak postprandial:   less than 180 mg/dL (1-2 hours)      Critically ill patients:  140 - 180 mg/dL    Latest Reference Range & Units 03/21/21 03:07 03/21/21 04:11 03/21/21 04:40 03/21/21 05:08 03/21/21 06:01 03/21/21 06:42 03/21/21 07:42 03/21/21 08:15  Glucose-Capillary 70 - 99 mg/dL >600 (HH) >600 (HH) >600 (HH) >600 (HH) >600 (HH) >600 (HH) >600 (HH) >600 (HH)    Latest Reference Range & Units 03/21/21 00:02 03/21/21 02:16 03/21/21 04:24  Glucose 70 - 99 mg/dL 1,095 (HH) 1,075 (HH) 897 (HH)    Latest Reference Range & Units 03/20/21 22:34  CO2 22 - 32 mmol/L <7 (L)  Glucose 70 - 99 mg/dL 1,191 (HH)  Anion gap 5 - 15  NOT CALCULATED  (HH): Data is critically high (L): Data is abnormally low (H): Data is abnormally high  Review of Glycemic Control  Diabetes history: DM1 (does NOT make any insulin; requires basal, correction, and carb coverage insulin) Outpatient Diabetes medications: OmniPod insulin pump with Novolog  Current orders for Inpatient glycemic control: IV insulin  Inpatient Diabetes Program Recommendations:    Insulin: IV insulin should be continued until acidosis is completely cleared. Once acidosis is completely cleared and provider ready to transition from IV to SQ insulin, please consider Semglee 13 units Q24H, CBGs Q4H, Novolog 0-9 units Q4H, and Novolog 3 units TID with meals if patient eats at least 50% of meals.   NOTE: Patient has Type 1 DM and has had multiple admissions for DKA (most recent hospitalization 03/07/21-03/09/21. Inpatient diabetes coordinator spoke with patient on 03/08/21 during last hospitalization and on 12/08/20 during another admission. Patient's Endocrinologist is Dr. Kelton Pillar and patient's last appointment was 12/16/20.   Per office note on 12/16/20, the following should be  insulin pump settings:  Basal Rates 00:00  0.5 units/hr 08:00 0:6 units/hr Total Basal per day: 13.6 units/24 hours  Insulin to Carb Ratio 1:12 (1 unit covers 12 grams of carbs)  Insulin Sensitivity 1:65 (1 unit drops glucose 65 mg/dl) Target glucose: 100 mg/dl  Per H&P on 03/21/21, patient admitted today with DKA, COVID and Flu, and respiratory failure. Initial glucose 1191 mg/dl and patient was started on IV insulin. CBG still over 600 mg/dl and most current lab glucose was 897 mg/dl at 4:24 am today. IV insulin should be continued until acidosis is completely resolved. Once acidosis is completely resolved and provider is ready to transition to SQ insulin, patient will require basal, correction, and carb coverage insulin. Would recommend using SQ insulin regimen while inpatient.  Thanks, Barnie Alderman, RN, MSN, CDE Diabetes Coordinator Inpatient Diabetes Program [phone] (Team Pager from 8am to 5pm)

## 2021-03-21 NOTE — H&P (Signed)
NAME:  Ashley Freeman, MRN:  [id number], DOB:  02/06/1991, LOS: 1 ADMISSION DATE:  03/20/2021, CONSULTATION DATE:  03/20/2021 REFERRING MD: Delman Kitten, MD CHIEF COMPLAINT: DKA    HPI  30 y.o with significant PMH of right gluteal abscess, Anemia of Chronic Disease, Bartholin's glands abscess, Bacterial Vaginosis, Gonorrhea, Trichomoniasis, type I DM, multiple episodes of DKA, hyperlipidemia, history of sepsis, and stage 3a CKD who presented to the ED with chief complaints of generalized weakness and hyperglycemia.  Patient unable to provide history due to acute illness and lethargy. History is limited to ED and EMS report. Per ED notes, EMS was called by patient's family due to worsening fatigue and weakness. No reports of nausea, vomiting, abdominal pain, diarrhea, SOB, chest pain, fevers, chills or cough. Patient was recently discharged from [facility] on 03/09/2021 following admission for severe DKA.  ED Course: On arrival to the ED, she was afebrile with blood pressure  139/78 mm Hg and pulse rate 104 beats/min. Respiration 28 breaths per minute. Sats 99% on RA. There were no focal neurological deficits; she was lethargic and unable to answer orientation questions. Patient also noted with kussmal breathing. Pertinent Labs in Red/Diagnostics Findings: Na+/ K+: 123/7.1 Glucose:1191 BUN/Cr.: 85/4.41 Calcium: 7.2 Anion Gap: Not calculated Beta-Hydroxybutyric:>8.0 CO2: <7 AST/ALT:   WBC/ TMAX: 29.3/ afebrile Hgb/Hct: 9.0/32.9 Plts: 400 D-Dimer: 1.27 PCT:  Lactic acid: 3.8 COVID PCR: Positive Influenza A: Positive   Troponin: 173 BNP:122 Venous Blood Gas result:  pO2 74.0; pCO2 <19.0; pH 7.09;  HCO3 2.7, %O2 Sat 87.1   EKG: sinus tachycardia, occasional PVC noted, unifocal, prolonged QT interval. Peaked Tall T wave abnormality. CXR: No acute chest findings  Patient was volume resuscitated with IVFs and started on insulin following correction of hyperkalemia.  Due to concerns for sepsis, she was started on broad spectrum abx pending further work up. PCCM consulted.  Past Medical History   Right Gluteal Abscess  08/24/2013  AKI (acute kidney injury) ([place]) 07/26/2014  Anemia 02/19/2012  Bartholin's gland abscess 09/19/2013  BV (bacterial vaginosis) 11/24/2015  Diabetes mellitus type I ([place]) 2001  Diagnosed at age 75 ; Type I  Diarrhea 05/30/2016  DKA (diabetic ketoacidoses) 08/19/2013  Also in 2018  Gonorrhea 08/2011  Treated in 09/2011  History of trichomoniasis 05/31/2016  Hyperlipidemia 03/28/2016  Sepsis ([place]) 09/19/2013   Significant Hospital Events   11/21: Admitted to the ICU with severe DKA in the setting of COVID 19 and Influenza A viral infection  Consults:  PCCM  Procedures:  11/22> Left Femoral Venous Catheter  Significant Diagnostic Tests:  11/21: Chest Xray>No active cardiopulmonary process  Micro Data:  11/21: SARS-CoV-2 PCR> POSITIVE 11/21: Influenza A PCR> POSITIVE 11/21: Blood culture x2> 11/21: Urine Culture> 11/21: MRSA PCR>>   Antimicrobials:  Vancomycin 11/22> Cefepime 11/22> Metronidazole 11/22>  OBJECTIVE  Blood pressure 118/67, pulse (!) 101, temperature 99.5 F (37.5 C), temperature source Rectal, resp. rate (!) 22, height _0  (1.6 m), weight 56 kg, SpO2 100 %, unknown if currently breastfeeding.        Intake/Output Summary (Last 24 hours) at 03/21/2021 0003 Last data filed at 03/20/2021 2327 Gross per 24 hour  Intake 50 ml  Output --  Net 50 ml   Filed Weights   03/20/21 2110  Weight: 56 kg     Physical Examination  GENERAL: 30 year-old critically ill patient lying in the bed lethargic with mild kussmal breathing EYES: Pupils equal, round, reactive to light and accommodation. No scleral icterus.  Extraocular muscles intact.  HEENT: Head atraumatic, normocephalic. Oropharynx and nasopharynx clear.  NECK:  Supple, no jugular venous distention. No thyroid enlargement, no tenderness.  LUNGS:  Normal breath sounds bilaterally, no wheezing, rales,rhonchi or crepitation. No use of accessory muscles of respiration.  CARDIOVASCULAR: S1, S2 normal. No murmurs, rubs, or gallops.  ABDOMEN: Soft, nontender, nondistended. Bowel sounds present. No organomegaly or mass.  EXTREMITIES: Mild pedal edema, cyanosis, or clubbing.  NEUROLOGIC: Cranial nerves II through XII are intact.  Muscle strength not checked. Sensation intact. Gait not checked.  PSYCHIATRIC: The patient is alert and oriented x 3.  SKIN: No obvious rash, lesion, or ulcer.   Labs/imaging that I havepersonally reviewed  (right click and "Reselect all SmartList Selections" daily)     Labs   CBC: Recent Labs  Lab 03/20/21 2110  WBC 29.3*  HGB 9.0*  HCT 32.9*  MCV 95.1  PLT 175    Basic Metabolic Panel: No results for input(s): NA, K, CL, CO2, GLUCOSE, BUN, CREATININE, CALCIUM, MG, PHOS in the last 168 hours. GFR: Estimated Creatinine Clearance: 37.9 mL/min (A) (by C-G formula based on SCr of 1.83 mg/dL (H)). Recent Labs  Lab 03/20/21 2110 03/20/21 2111  WBC 29.3*  --   LATICACIDVEN  --  3.8*    Liver Function Tests: No results for input(s): AST, ALT, ALKPHOS, BILITOT, PROT, ALBUMIN in the last 168 hours. No results for input(s): LIPASE, AMYLASE in the last 168 hours. No results for input(s): AMMONIA in the last 168 hours.  ABG    Component Value Date/Time   PHART 7.258 (L) 10/23/2018 0844   PCO2ART 25.0 (L) 10/23/2018 0844   PO2ART 108 10/23/2018 0844   HCO3 2.7 (L) 03/20/2021 2111   TCO2 13 (L) 10/06/2020 1020   ACIDBASEDEF 24.9 (H) 03/20/2021 2111   O2SAT 87.1 03/20/2021 2111     Coagulation Profile: No results for input(s): INR, PROTIME in the last 168 hours.  Cardiac Enzymes: No results for input(s): CKTOTAL, CKMB, CKMBINDEX, TROPONINI in the last 168 hours.  HbA1C: Hemoglobin A1C  Date/Time Value Ref Range Status  11/11/2013 04:31 AM 14.6 (H) 4.2 - 6.3 % Final    Comment:    The American  Diabetes Association recommends that a primary goal of therapy should be <7% and that physicians should reevaluate the treatment regimen in patients with HbA1c values consistently >8%.   09/12/2011 02:53 AM SEE COMMENT 4.2 - 6.3 % Final    Comment:    The American Diabetes Association recommends that a primary goal of therapy should be <7% and that physicians should reevaluate the treatment regimen in patients with HbA1c values consistently >8%. HGB A1C - Unable to perform testing at [facility] due  - to interfering substance. HGB A1C - H -- 16.8 %  - Reference Range: 4.8-5.6  - ----------------------------------------  - Increased risk for diabetes: 5.7-6.4  - Diabetes: >6.4  - Glycemic control for adults with  - .[name]... diabetes: <7.0  - ----------------------------------------  - PERFORMED BY [facility] SPEC.NO.: [id number]  - [address] [id number]  - Lindon Romp, MD  - [phone]    HbA1c, POC (controlled diabetic range)  Date/Time Value Ref Range Status  06/04/2018 09:29 AM   Final    Comment:    >15   Hgb A1c MFr Bld  Date/Time Value Ref Range Status  03/07/2021 04:10 PM 10.5 (H) 4.8 - 5.6 % Final    Comment:    (NOTE)  Prediabetes: 5.7 - 6.4         Diabetes: >6.4         Glycemic control for adults with diabetes: <7.0   12/08/2020 04:17 AM 13.0 (H) 4.8 - 5.6 % Final    Comment:    (NOTE) Pre diabetes:          5.7%-6.4%  Diabetes:              >6.4%  Glycemic control for   <7.0% adults with diabetes     CBG: No results for input(s): GLUCAP in the last 168 hours.  Review of Systems:   UNABLE TO OBTAIN FROM PATIENT POOR HISTORIAN AND LETHARGIC  Past Medical History  She,  has a past medical history of Abscess, gluteal, right (08/24/2013), AKI (acute kidney injury) ([place]) (07/26/2014), Anemia (02/19/2012), Bartholin's gland abscess (09/19/2013), BV (bacterial vaginosis) (11/24/2015), Diabetes mellitus type I ([place])  (2001), Diarrhea (05/30/2016), DKA (diabetic ketoacidoses) (08/19/2013), Gonorrhea (08/2011), History of trichomoniasis (05/31/2016), Hyperlipidemia (03/28/2016), and Sepsis ([place]) (09/19/2013).   Surgical History    Past Surgical History:  Procedure Laterality Date   CESAREAN SECTION N/A 10/05/2019   Procedure: CESAREAN SECTION;  Surgeon: Aletha Halim, MD;  Location: [facility];  Service: Obstetrics;  Laterality: N/A;   INCISION AND DRAINAGE ABSCESS Left 09/28/2019   Procedure: INCISION AND DRAINAGE VULVAR ABCESS;  Surgeon: [name], MD;  Location: [place];  Service: Gynecology;  Laterality: Left;   INCISION AND DRAINAGE PERIRECTAL ABSCESS Right 08/18/2013   Procedure: IRRIGATION AND DEBRIDEMENT GLUTEAL ABSCESS;  Surgeon: Ralene Ok, MD;  Location: [place];  Service: General;  Laterality: Right;   INCISION AND DRAINAGE PERIRECTAL ABSCESS Right 09/19/2013   Procedure: IRRIGATION AND DEBRIDEMENT RIGHT GLUTEAL AND LABIAL ABSCESSES;  Surgeon: Ralene Ok, MD;  Location: [place];  Service: General;  Laterality: Right;   INCISION AND DRAINAGE PERIRECTAL ABSCESS Right 09/24/2013   Procedure: IRRIGATION AND DEBRIDEMENT PERIRECTAL ABSCESS;  Surgeon: Gwenyth Ober, MD;  Location: [place];  Service: General;  Laterality: Right;     Social History   reports that she has never smoked. She has never used smokeless tobacco. She reports that she does not currently use alcohol. She reports that she does not use drugs.   Family History   Her family history includes Asthma in her mother; Carpal tunnel syndrome in her mother; Diabetes in her paternal grandmother; Gout in her father. There is no history of Anesthesia problems.   Allergies Allergies  Allergen Reactions   Cephalexin Anaphylaxis   Penicillins Hives and Rash    Has patient had a PCN reaction causing immediate rash, facial/tongue/throat swelling, SOB or lightheadedness with hypotension: Yes Has patient had a PCN reaction causing severe rash  involving mucus membranes or skin necrosis: No Has patient had a PCN reaction that required hospitalization: Yes Has patient had a PCN reaction occurring within the last 10 years: No Spoke with pt - childhood hives told by mom, tried no pcns since, doesn't remember reaction herself    Benadryl [Diphenhydramine] Itching   Doxycycline Itching     Home Medications  Prior to Admission medications   Medication Sig Start Date End Date Taking? Authorizing Provider  albuterol (VENTOLIN HFA) 108 (90 Base) MCG/ACT inhaler Inhale 2 puffs into the lungs every 4 (four) hours as needed for wheezing or shortness of breath. 08/09/20   [provider]  amLODipine (NORVASC) 5 MG tablet Take 2 tablets (10 mg total) by mouth daily. 03/10/21   Cordelia Poche  A, MD  blood glucose meter kit and supplies KIT Dispense based on patient and insurance preference. Use up to four times daily as directed. (FOR ICD-9 250.00, 250.01). 05/25/17   Isla Pence, MD  Blood Pressure Monitoring (BLOOD PRESSURE KIT) DEVI 1 Device by Does not apply route as needed. 05/06/19   Anyanwu, Sallyanne Havers, MD  Continuous Blood Gluc Sensor (DEXCOM G6 SENSOR) MISC Inject 1 Device into the skin as directed. 12/16/20   Shamleffer, Melanie Crazier, MD  Continuous Blood Gluc Transmit (DEXCOM G6 TRANSMITTER) MISC Inject 1 Device into the skin as directed. Use to check blood sugar daily 12/16/20   Shamleffer, Melanie Crazier, MD  insulin aspart (NOVOLOG) 100 UNIT/ML FlexPen 0-9 Units, Subcutaneous, 3 times daily with meals, CBG < 70: Implement Hypoglycemia measures CBG 70 - 120: 0 units CBG 121 - 150: 1 unit CBG 151 - 200: 2 units CBG 201 - 250: 3 units CBG 251 - 300: 5 units CBG 301 - 350: 7 units CBG 351 - 400: 9 units CBG > 400: call MD Patient taking differently: Inject 0-9 Units into the skin See admin instructions. 0-9 Units, Subcutaneous, 3 times daily with meals, CBG < 70: Implement Hypoglycemia measures CBG 70 - 120: 0 units CBG 121 - 150: 1  unit CBG 151 - 200: 2 units CBG 201 - 250: 3 units CBG 251 - 300: 5 units CBG 301 - 350: 7 units CBG 351 - 400: 9 units CBG > 400: call MD. Using pump. 01/30/21   Shamleffer, Melanie Crazier, MD  Insulin Disposable Pump (OMNIPOD 5 G6 INTRO, GEN 5,) KIT 1 Device by Does not apply route every 3 (three) days. Patient taking differently: 1 Device by Does not apply route every 3 (three) days. 100 units for 72 hours 12/16/20   Shamleffer, Melanie Crazier, MD  Insulin Disposable Pump (OMNIPOD 5 G6 POD, GEN 5,) MISC 1 Device by Does not apply route every 3 (three) days. Patient not taking: No sig reported 12/16/20   Shamleffer, Melanie Crazier, MD  Insulin Pen Needle 32G X 4 MM MISC Use as directed 10/07/20   Ghimire, Henreitta Leber, MD  lamoTRIgine (LAMICTAL) 100 MG tablet Take 100 mg by mouth daily. 02/28/21   [provider]  Ferrous Sulfate (IRON) 325 (65 Fe) MG TABS Take 1 tablet (325 mg total) by mouth every other day. Patient not taking: Reported on 07/25/2020 06/17/19 07/25/20  Chauncey Mann, MD  hydrochlorothiazide (HYDRODIURIL) 25 MG tablet Take 1 tablet (25 mg total) by mouth daily. Patient not taking: No sig reported 10/08/19 07/25/20  Constant, Peggy, MD  promethazine (PHENERGAN) 25 MG tablet Take 1 tablet (25 mg total) by mouth every 8 (eight) hours as needed for nausea or vomiting (if zofran fails). Patient not taking: Reported on 10/06/2020 08/25/20 10/06/20  Varney Biles, MD    Scheduled Meds:  albuterol  15 mg Nebulization Once   albuterol  4 puff Inhalation Q6H   vitamin C  500 mg Oral Daily   Chlorhexidine Gluconate Cloth  6 each Topical Daily   heparin injection (subcutaneous)  7,500 Units Subcutaneous Q8H   ipratropium  1 puff Inhalation Q6H   oseltamivir  30 mg Oral BID   oseltamivir  75 mg Oral Once   vancomycin variable dose per unstable renal function (pharmacist dosing)   Does not apply See admin instructions   zinc sulfate  220 mg Oral Daily   Continuous Infusions:  ceFEPime  (MAXIPIME) IV 2 g (03/21/21 0305)   dextrose  5% lactated ringers Stopped (03/21/21 0042)   insulin 6 Units/hr (03/21/21 0041)   lactated ringers Stopped (03/21/21 0035)   metronidazole 500 mg (03/21/21 0338)   remdesivir 100 mg in NS 100 mL     PRN Meds:.chlorpheniramine-HYDROcodone, dextrose, docusate sodium, guaiFENesin-dextromethorphan, polyethylene glycol  Assessment & Plan:  Diabetic Ketoacidosis  in the setting of +COVID& Influenza Infection Severe High Anion Gap Metabolic Acidosis PMHx: DM type 1  Home meds:  Insulin pump -Trigger Assessment (CXR, UA negative, Blood  and urine Cultures for infection pending) -Troponin elevated, EKG  shows no ischemia -Will check Lipase for pancreatitis -Received Insulin (regular) 0.1u/kg (~10 units) IV x1. Continue Insulin drip, DKA protocol -Keep NPO -Glucose: q1h to titrate insulin -Lab monitoring: q2-4h BMP+Phosphorus+pH (ABG/VBG) to assess severity of acidemia -Aggressive  volume repletion  -Goal to normalize anion gap  -When normalize anion gap and low stable insulin requirement for 4 hours will start  long acting insulin (NPH) then stop drip 2-3 hours AFTER to bridge effect and start carb-controlled diet. -Diabetes coordinator consult  Acute hypoxic respiratory Failure secondary to COVID-19 & Influenza Infection  Leukocytosis : WBC 29.3  likely reactive but cannot r/o secondary causes Lactic: 3.8, Baseline PCT: 9.49, UA: negative for UTI, CXR: No active disease -Supplemental O2 as needed to maintain O2 saturations 88 to 92% -Monitor fever curve -Start Remdesivir and Tamiflu -Hold steroids in the setting of DKA -Start Empiric abx Cefepime/ Vancomycin/ Metronidazole may need to de-escalate if no identifiable infectious process -Inhalers with flutter valve -Daily CRP, CMP, CBC, D-dimer, Inflammatory markers -Echocardiogram pending -Vitamins (Zinc and Vitamin C)  Acute on Chronic CKD stage III Hyponatremia- likely pseudohyponatremia  in the setting of DKA correct for hyperglycemia Hyperkalemia corrected with sodium bicarb, calcium gluconate and Insulin -Monitor I&O's / urinary output -Follow BMP -Ensure adequate renal perfusion -Avoid nephrotoxic agents as able -Replace electrolytes as indicated   Hypertension -Resume home med Amlodipine once able to take po and BP permits -PRN Labetalol and Hydralazine  Best practice:  Diet:  NPO Pain/Anxiety/Delirium protocol (if indicated): No VAP protocol (if indicated): Not indicated DVT prophylaxis: Subcutaneous Heparin GI prophylaxis: N/A Glucose control:  Insulin gtt Central venous access:  Yes, and it is still needed Arterial line:  N/A Foley:  Yes, and it is still needed Mobility:  bed rest  PT consulted: N/A Last date of multidisciplinary goals of care discussion [11/21] Code Status:  full code Disposition: ICU   = Goals of Care = Code Status Order: FULL  Primary Emergency Contact: [place],Latisha Wishes to pursue full aggressive treatment and intervention options, including CPR and intubation, but goals of care will be addressed on going with family if that should become necessary.   Critical care time: 45 minutes     Rufina Falco, DNP, CCRN, FNP-C, AGACNP-BC Acute Care Nurse Practitioner  [facility] Pager: [phone] [place] [facility]  .

## 2021-03-21 NOTE — Progress Notes (Signed)
eLink Physician-Brief Progress Note Patient Name: Ashley Freeman DOB: 1990/05/17 MRN: [id number]   Date of Service  03/21/2021  HPI/Events of Note  61F with DM1 on insulin pump, hx multiple DKAs, CKD IIIa who presented with weakness and hyperglycemia admitted for DKA. BHA >8. Positive for COVID-19 and influenza A. Trop 443  Severe metabolic acidosis 2/2 DKA in setting of +COVID/influenza  eICU Interventions  Continue IVF, insulin gtt BMET q4h Currently protecting airway     Intervention Category Evaluation Type: New Patient Evaluation  [name] 03/21/2021, 1:51 AM

## 2021-03-22 LAB — GLUCOSE, CAPILLARY
Glucose-Capillary: 179 mg/dL — ABNORMAL HIGH (ref 70–99)
Glucose-Capillary: 151 mg/dL — ABNORMAL HIGH (ref 70–99)
Glucose-Capillary: 59 mg/dL — ABNORMAL LOW (ref 70–99)
Glucose-Capillary: 164 mg/dL — ABNORMAL HIGH (ref 70–99)
Glucose-Capillary: 39 mg/dL — CL (ref 70–99)
Glucose-Capillary: 167 mg/dL — ABNORMAL HIGH (ref 70–99)
Glucose-Capillary: 206 mg/dL — ABNORMAL HIGH (ref 70–99)
Glucose-Capillary: 143 mg/dL — ABNORMAL HIGH (ref 70–99)
Glucose-Capillary: 71 mg/dL (ref 70–99)
Glucose-Capillary: 102 mg/dL — ABNORMAL HIGH (ref 70–99)

## 2021-03-22 LAB — CBC WITH DIFFERENTIAL/PLATELET
Abs Immature Granulocytes: 1.91 10*3/uL — ABNORMAL HIGH (ref 0.00–0.07)
Basophils Absolute: 0.1 10*3/uL (ref 0.0–0.1)
Basophils Relative: 0 %
Eosinophils Absolute: 0 10*3/uL (ref 0.0–0.5)
Eosinophils Relative: 0 %
HCT: 21.1 % — ABNORMAL LOW (ref 36.0–46.0)
Hemoglobin: 7.4 g/dL — ABNORMAL LOW (ref 12.0–15.0)
Immature Granulocytes: 8 %
Lymphocytes Relative: 11 %
Lymphs Abs: 2.6 10*3/uL (ref 0.7–4.0)
MCH: 26.5 pg (ref 26.0–34.0)
MCHC: 35.1 g/dL (ref 30.0–36.0)
MCV: 75.6 fL — ABNORMAL LOW (ref 80.0–100.0)
Monocytes Absolute: 1.2 10*3/uL — ABNORMAL HIGH (ref 0.1–1.0)
Monocytes Relative: 5 %
Neutro Abs: 17.9 10*3/uL — ABNORMAL HIGH (ref 1.7–7.7)
Neutrophils Relative %: 76 %
Platelets: 331 10*3/uL (ref 150–400)
RBC: 2.79 MIL/uL — ABNORMAL LOW (ref 3.87–5.11)
RDW: 12.7 % (ref 11.5–15.5)
Smear Review: NORMAL
WBC: 23.8 10*3/uL — ABNORMAL HIGH (ref 4.0–10.5)
nRBC: 0 % (ref 0.0–0.2)

## 2021-03-22 LAB — BASIC METABOLIC PANEL
Anion gap: 7 (ref 5–15)
BUN: 65 mg/dL — ABNORMAL HIGH (ref 6–20)
CO2: 20 mmol/L — ABNORMAL LOW (ref 22–32)
Calcium: 7.3 mg/dL — ABNORMAL LOW (ref 8.9–10.3)
Chloride: 112 mmol/L — ABNORMAL HIGH (ref 98–111)
Creatinine, Ser: 2.85 mg/dL — ABNORMAL HIGH (ref 0.44–1.00)
GFR, Estimated: 22 mL/min — ABNORMAL LOW (ref >60)
Glucose, Bld: 148 mg/dL — ABNORMAL HIGH (ref 70–99)
Potassium: 3.2 mmol/L — ABNORMAL LOW (ref 3.5–5.1)
Sodium: 139 mmol/L (ref 135–145)

## 2021-03-22 LAB — MAGNESIUM: Magnesium: 1.5 mg/dL — ABNORMAL LOW (ref 1.7–2.4)

## 2021-03-22 LAB — FERRITIN: Ferritin: 599 ng/mL — ABNORMAL HIGH (ref 11–307)

## 2021-03-22 LAB — PHOSPHORUS: Phosphorus: 3.1 mg/dL (ref 2.5–4.6)

## 2021-03-22 LAB — C-REACTIVE PROTEIN: CRP: 0.9 mg/dL (ref <1.0)

## 2021-03-22 LAB — D-DIMER, QUANTITATIVE: D-Dimer, Quant: 1.8 ug/mL-FEU — ABNORMAL HIGH (ref 0.00–0.50)

## 2021-03-22 MED ORDER — MAGNESIUM SULFATE 2 GM/50ML IV SOLN
2.0000 g | Freq: Once | INTRAVENOUS | Status: AC
  Administered 2021-03-22: 2 g via INTRAVENOUS
  Filled 2021-03-22: qty 50

## 2021-03-22 MED ORDER — ENSURE MAX PROTEIN PO LIQD
11.0000 [oz_av] | Freq: Two times a day (BID) | ORAL | Status: DC
  Administered 2021-03-22 – 2021-03-23 (×2): 11 [oz_av] via ORAL
  Filled 2021-03-22: qty 330

## 2021-03-22 MED ORDER — INSULIN ASPART 100 UNIT/ML IJ SOLN
3.0000 [IU] | Freq: Three times a day (TID) | INTRAMUSCULAR | Status: DC
  Administered 2021-03-22 – 2021-03-24 (×4): 3 [IU] via SUBCUTANEOUS
  Filled 2021-03-22 (×4): qty 1

## 2021-03-22 MED ORDER — INSULIN ASPART 100 UNIT/ML IJ SOLN
0.0000 [IU] | Freq: Three times a day (TID) | INTRAMUSCULAR | Status: DC
  Administered 2021-03-22: 2 [IU] via SUBCUTANEOUS
  Filled 2021-03-22: qty 1

## 2021-03-22 MED ORDER — ADULT MULTIVITAMIN W/MINERALS CH
1.0000 | ORAL_TABLET | Freq: Every day | ORAL | Status: DC
  Administered 2021-03-23 – 2021-03-24 (×2): 1 via ORAL
  Filled 2021-03-22 (×2): qty 1

## 2021-03-22 MED ORDER — POTASSIUM CHLORIDE 10 MEQ/100ML IV SOLN
10.0000 meq | INTRAVENOUS | Status: AC
  Administered 2021-03-22 (×2): 10 meq via INTRAVENOUS
  Filled 2021-03-22 (×2): qty 100

## 2021-03-22 MED ORDER — INSULIN GLARGINE-YFGN 100 UNIT/ML ~~LOC~~ SOLN
12.0000 [IU] | SUBCUTANEOUS | Status: DC
  Administered 2021-03-22: 12 [IU] via SUBCUTANEOUS
  Filled 2021-03-22 (×3): qty 0.12

## 2021-03-22 MED ORDER — TRIMETHOBENZAMIDE HCL 300 MG PO CAPS: 300.0000 mg | ORAL_CAPSULE | Freq: Three times a day (TID) | ORAL | Status: DC

## 2021-03-22 MED ORDER — TRIMETHOBENZAMIDE HCL 100 MG/ML IM SOLN
200.0000 mg | Freq: Three times a day (TID) | INTRAMUSCULAR | Status: DC
  Administered 2021-03-22 – 2021-03-23 (×2): 200 mg via INTRAMUSCULAR
  Filled 2021-03-22 (×7): qty 2

## 2021-03-22 NOTE — Progress Notes (Signed)
NAME:  Ashley Freeman, MRN:  [id number], DOB:  Oct 31, 1990, LOS: 2 ADMISSION DATE:  03/20/2021, CONSULTATION DATE:  03/20/2021 REFERRING MD: Delman Kitten, MD CHIEF COMPLAINT: DKA    HPI  30 y.o with significant PMH of right gluteal abscess, Anemia of Chronic Disease, Bartholin's glands abscess, Bacterial Vaginosis, Gonorrhea, Trichomoniasis, type I DM, multiple episodes of DKA, hyperlipidemia, history of sepsis, and stage 3a CKD who presented to the ED with chief complaints of generalized weakness and hyperglycemia.  Patient unable to provide history due to acute illness and lethargy. History is limited to ED and EMS report. Per ED notes, EMS was called by patient's family due to worsening fatigue and weakness. No reports of nausea, vomiting, abdominal pain, diarrhea, SOB, chest pain, fevers, chills or cough. Patient was recently discharged from [facility] on 03/09/2021 following admission for severe DKA.  03/22/21- patient is clinically improved. Diabetic coordinator modified regimen today. She is eating PO now.   Past Medical History   Right Gluteal Abscess  08/24/2013  AKI (acute kidney injury) ([place]) 07/26/2014  Anemia 02/19/2012  Bartholin's gland abscess 09/19/2013  BV (bacterial vaginosis) 11/24/2015  Diabetes mellitus type I ([place]) 2001  Diagnosed at age 26 ; Type I  Diarrhea 05/30/2016  DKA (diabetic ketoacidoses) 08/19/2013  Also in 2018  Gonorrhea 08/2011  Treated in 09/2011  History of trichomoniasis 05/31/2016  Hyperlipidemia 03/28/2016  Sepsis ([place]) 09/19/2013    Procedures:  11/22> Left Femoral Venous Catheter  Significant Diagnostic Tests:  11/21: Chest Xray>No active cardiopulmonary process  Micro Data:  11/21: SARS-CoV-2 PCR> POSITIVE 11/21: Influenza A PCR> POSITIVE 11/21: Blood culture x2> 11/21: Urine Culture> 11/21: MRSA PCR>>   Antimicrobials:  Vancomycin 11/22> Cefepime 11/22> Metronidazole 11/22>  OBJECTIVE  Blood pressure 139/79,  pulse (!) 102, temperature 98 F (36.7 C), temperature source Oral, resp. rate 17, height '5\' 3"'  (1.6 m), weight 59.4 kg, SpO2 97 %, unknown if currently breastfeeding.        Intake/Output Summary (Last 24 hours) at 03/22/2021 0845 Last data filed at 03/22/2021 5732 Gross per 24 hour  Intake 3152.69 ml  Output 1800 ml  Net 1352.69 ml    Filed Weights   03/20/21 2110 03/21/21 0251 03/22/21 0500  Weight: 56 kg 56.1 kg 59.4 kg     Physical Examination  GENERAL: 30 year-old age appropriate nAD EYES: Pupils equal, round, reactive to light and accommodation. No scleral icterus. Extraocular muscles intact.  HEENT: Head atraumatic, normocephalic. Oropharynx and nasopharynx clear.  NECK:  Supple, no jugular venous distention. No thyroid enlargement, no tenderness.  LUNGS: Normal breath sounds bilaterally, no wheezing, rales,rhonchi or crepitation. No use of accessory muscles of respiration.  CARDIOVASCULAR: S1, S2 normal. No murmurs, rubs, or gallops.  ABDOMEN: Soft, nontender, nondistended. Bowel sounds present. No organomegaly or mass.  EXTREMITIES: Mild pedal edema, cyanosis, or clubbing.  NEUROLOGIC: Cranial nerves II through XII are intact.  Muscle strength not checked. Sensation intact. Gait not checked.  PSYCHIATRIC: The patient is alert and oriented x 3.  SKIN: No obvious rash, lesion, or ulcer.   Labs/imaging that I havepersonally reviewed  (right click and "Reselect all SmartList Selections" daily)     Labs   CBC: Recent Labs  Lab 03/20/21 2110 03/21/21 0424 03/21/21 1516 03/22/21 0231  WBC 29.3* 31.5*  --  23.8*  NEUTROABS  --  23.4*  --  17.9*  HGB 9.0* 6.8* 7.1* 7.4*  HCT 32.9* 21.5* 20.4* 21.1*  MCV 95.1 82.7  --  75.6*  PLT 400 301  --  331     Basic Metabolic Panel: Recent Labs  Lab 03/21/21 0424 03/21/21 1050 03/21/21 1516 03/21/21 1813 03/21/21 2148 03/22/21 0210  NA 136 137 137 139 140 139  K 3.9 3.8 3.4* 3.4* 3.5 3.2*  CL 106 109 109 111 114*  112*  CO2 11* 18* 19* 18* 20* 20*  GLUCOSE 897* 571* 318* 315* 125* 148*  BUN 73* 69* 76* 68* 62* 65*  CREATININE 3.58* 3.17* 3.23* 3.01* 2.81* 2.85*  CALCIUM 6.8* 7.0* 7.2* 7.2* 7.2* 7.3*  MG 1.8  --   --   --   --  1.5*  PHOS 5.9*  --   --   --   --  3.1   GFR: Estimated Creatinine Clearance: 24.3 mL/min (A) (by C-G formula based on SCr of 2.85 mg/dL (H)). Recent Labs  Lab 03/20/21 2110 03/20/21 2111 03/21/21 0216 03/21/21 0424 03/22/21 0231  PROCALCITON 9.49  --   --   --   --   WBC 29.3*  --   --  31.5* 23.8*  LATICACIDVEN  --  3.8* 3.5* 3.0*  --      Liver Function Tests: Recent Labs  Lab 03/21/21 0002 03/21/21 0216  AST 41 42*  ALT 31 31  ALKPHOS 77 74  BILITOT 1.4* 1.5*  PROT 6.3* 6.0*  ALBUMIN 2.7* 2.5*   Recent Labs  Lab 03/21/21 0002  LIPASE 26   No results for input(s): AMMONIA in the last 168 hours.  ABG    Component Value Date/Time   PHART 7.22 (L) 03/21/2021 0355   PCO2ART 19 (LL) 03/21/2021 0355   PO2ART 46 (L) 03/21/2021 0355   HCO3 7.8 (L) 03/21/2021 0355   TCO2 13 (L) 10/06/2020 1020   ACIDBASEDEF 18.2 (H) 03/21/2021 0355   O2SAT 71.2 03/21/2021 0355      Coagulation Profile: No results for input(s): INR, PROTIME in the last 168 hours.  Cardiac Enzymes: No results for input(s): CKTOTAL, CKMB, CKMBINDEX, TROPONINI in the last 168 hours.  HbA1C: Hemoglobin A1C  Date/Time Value Ref Range Status  11/11/2013 04:31 AM 14.6 (H) 4.2 - 6.3 % Final    Comment:    The American Diabetes Association recommends that a primary goal of therapy should be <7% and that physicians should reevaluate the treatment regimen in patients with HbA1c values consistently >8%.   09/12/2011 02:53 AM SEE COMMENT 4.2 - 6.3 % Final    Comment:    The American Diabetes Association recommends that a primary goal of therapy should be <7% and that physicians should reevaluate the treatment regimen in patients with HbA1c values consistently >8%. HGB A1C - Unable  to perform testing at [facility] due  - to interfering substance. HGB A1C - H -- 16.8 %  - Reference Range: 4.8-5.6  - ----------------------------------------  - Increased risk for diabetes: 5.7-6.4  - Diabetes: >6.4  - Glycemic control for adults with  - .[name]... diabetes: <7.0  - ----------------------------------------  - PERFORMED BY [facility] SPEC.NO.: [phone]  - [address] [address]  - Lindon Romp, MD  - [phone]    HbA1c, POC (controlled diabetic range)  Date/Time Value Ref Range Status  06/04/2018 09:29 AM   Final    Comment:    >15   Hgb A1c MFr Bld  Date/Time Value Ref Range Status  03/07/2021 04:10 PM 10.5 (H) 4.8 - 5.6 % Final    Comment:    (NOTE)  Prediabetes: 5.7 - 6.4         Diabetes: >6.4         Glycemic control for adults with diabetes: <7.0   12/08/2020 04:17 AM 13.0 (H) 4.8 - 5.6 % Final    Comment:    (NOTE) Pre diabetes:          5.7%-6.4%  Diabetes:              >6.4%  Glycemic control for   <7.0% adults with diabetes     CBG: Recent Labs  Lab 03/22/21 0152 03/22/21 0415 03/22/21 0534 03/22/21 0556 03/22/21 0748  GLUCAP 151* 59* 39* 164* 167*    Review of Systems:   UNABLE TO OBTAIN FROM PATIENT POOR HISTORIAN AND LETHARGIC  Past Medical History  She,  has a past medical history of Abscess, gluteal, right (08/24/2013), AKI (acute kidney injury) ([place]) (07/26/2014), Anemia (02/19/2012), Bartholin's gland abscess (09/19/2013), BV (bacterial vaginosis) (11/24/2015), Diabetes mellitus type I ([place]) (2001), Diarrhea (05/30/2016), DKA (diabetic ketoacidoses) (08/19/2013), Gonorrhea (08/2011), History of trichomoniasis (05/31/2016), Hyperlipidemia (03/28/2016), and Sepsis ([place]) (09/19/2013).   Surgical History    Past Surgical History:  Procedure Laterality Date   CESAREAN SECTION N/A 10/05/2019   Procedure: CESAREAN SECTION;  Surgeon: Aletha Halim, MD;  Location: [facility];  Service:  Obstetrics;  Laterality: N/A;   INCISION AND DRAINAGE ABSCESS Left 09/28/2019   Procedure: INCISION AND DRAINAGE VULVAR ABCESS;  Surgeon: [name], MD;  Location: [place];  Service: Gynecology;  Laterality: Left;   INCISION AND DRAINAGE PERIRECTAL ABSCESS Right 08/18/2013   Procedure: IRRIGATION AND DEBRIDEMENT GLUTEAL ABSCESS;  Surgeon: Ralene Ok, MD;  Location: [place];  Service: General;  Laterality: Right;   INCISION AND DRAINAGE PERIRECTAL ABSCESS Right 09/19/2013   Procedure: IRRIGATION AND DEBRIDEMENT RIGHT GLUTEAL AND LABIAL ABSCESSES;  Surgeon: Ralene Ok, MD;  Location: [place];  Service: General;  Laterality: Right;   INCISION AND DRAINAGE PERIRECTAL ABSCESS Right 09/24/2013   Procedure: IRRIGATION AND DEBRIDEMENT PERIRECTAL ABSCESS;  Surgeon: Gwenyth Ober, MD;  Location: [place];  Service: General;  Laterality: Right;     Social History   reports that she has never smoked. She has never used smokeless tobacco. She reports that she does not currently use alcohol. She reports that she does not use drugs.   Family History   Her family history includes Asthma in her mother; Carpal tunnel syndrome in her mother; Diabetes in her paternal grandmother; Gout in her father. There is no history of Anesthesia problems.   Allergies Allergies  Allergen Reactions   Cephalexin Anaphylaxis   Penicillins Hives and Rash    Has patient had a PCN reaction causing immediate rash, facial/tongue/throat swelling, SOB or lightheadedness with hypotension: Yes Has patient had a PCN reaction causing severe rash involving mucus membranes or skin necrosis: No Has patient had a PCN reaction that required hospitalization: Yes Has patient had a PCN reaction occurring within the last 10 years: No Spoke with pt - childhood hives told by mom, tried no pcns since, doesn't remember reaction herself    Benadryl [Diphenhydramine] Itching   Doxycycline Itching     Home Medications  Prior to Admission  medications   Medication Sig Start Date End Date Taking? Authorizing Provider  albuterol (VENTOLIN HFA) 108 (90 Base) MCG/ACT inhaler Inhale 2 puffs into the lungs every 4 (four) hours as needed for wheezing or shortness of breath. 08/09/20   [provider]  amLODipine (NORVASC) 5 MG tablet Take  2 tablets (10 mg total) by mouth daily. 03/10/21   Mariel Aloe, MD  blood glucose meter kit and supplies KIT Dispense based on patient and insurance preference. Use up to four times daily as directed. (FOR ICD-9 250.00, 250.01). 05/25/17   Isla Pence, MD  Blood Pressure Monitoring (BLOOD PRESSURE KIT) DEVI 1 Device by Does not apply route as needed. 05/06/19   Anyanwu, Sallyanne Havers, MD  Continuous Blood Gluc Sensor (DEXCOM G6 SENSOR) MISC Inject 1 Device into the skin as directed. 12/16/20   Shamleffer, Melanie Crazier, MD  Continuous Blood Gluc Transmit (DEXCOM G6 TRANSMITTER) MISC Inject 1 Device into the skin as directed. Use to check blood sugar daily 12/16/20   Shamleffer, Melanie Crazier, MD  insulin aspart (NOVOLOG) 100 UNIT/ML FlexPen 0-9 Units, Subcutaneous, 3 times daily with meals, CBG < 70: Implement Hypoglycemia measures CBG 70 - 120: 0 units CBG 121 - 150: 1 unit CBG 151 - 200: 2 units CBG 201 - 250: 3 units CBG 251 - 300: 5 units CBG 301 - 350: 7 units CBG 351 - 400: 9 units CBG > 400: call MD Patient taking differently: Inject 0-9 Units into the skin See admin instructions. 0-9 Units, Subcutaneous, 3 times daily with meals, CBG < 70: Implement Hypoglycemia measures CBG 70 - 120: 0 units CBG 121 - 150: 1 unit CBG 151 - 200: 2 units CBG 201 - 250: 3 units CBG 251 - 300: 5 units CBG 301 - 350: 7 units CBG 351 - 400: 9 units CBG > 400: call MD. Using pump. 01/30/21   Shamleffer, Melanie Crazier, MD  Insulin Disposable Pump (OMNIPOD 5 G6 INTRO, GEN 5,) KIT 1 Device by Does not apply route every 3 (three) days. Patient taking differently: 1 Device by Does not apply route every 3 (three) days. 100  units for 72 hours 12/16/20   Shamleffer, Melanie Crazier, MD  Insulin Disposable Pump (OMNIPOD 5 G6 POD, GEN 5,) MISC 1 Device by Does not apply route every 3 (three) days. Patient not taking: No sig reported 12/16/20   Shamleffer, Melanie Crazier, MD  Insulin Pen Needle 32G X 4 MM MISC Use as directed 10/07/20   Ghimire, Henreitta Leber, MD  lamoTRIgine (LAMICTAL) 100 MG tablet Take 100 mg by mouth daily. 02/28/21   [provider]  Ferrous Sulfate (IRON) 325 (65 Fe) MG TABS Take 1 tablet (325 mg total) by mouth every other day. Patient not taking: Reported on 07/25/2020 06/17/19 07/25/20  Chauncey Mann, MD  hydrochlorothiazide (HYDRODIURIL) 25 MG tablet Take 1 tablet (25 mg total) by mouth daily. Patient not taking: No sig reported 10/08/19 07/25/20  Constant, Peggy, MD  promethazine (PHENERGAN) 25 MG tablet Take 1 tablet (25 mg total) by mouth every 8 (eight) hours as needed for nausea or vomiting (if zofran fails). Patient not taking: Reported on 10/06/2020 08/25/20 10/06/20  Varney Biles, MD    Scheduled Meds:  albuterol  15 mg Nebulization Once   albuterol  4 puff Inhalation Q6H   vitamin C  500 mg Oral Daily   Chlorhexidine Gluconate Cloth  6 each Topical Daily   heparin injection (subcutaneous)  5,000 Units Subcutaneous Q8H   insulin aspart  0-6 Units Subcutaneous TID AC & HS   insulin aspart  3 Units Subcutaneous TID WC   insulin glargine-yfgn  12 Units Subcutaneous Q24H   ipratropium  1 puff Inhalation Q6H   oseltamivir  30 mg Oral Daily   pantoprazole (PROTONIX) IV  40 mg  Intravenous Q24H   zinc sulfate  220 mg Oral Daily   Continuous Infusions:  lactated ringers Stopped (03/22/21 0051)   magnesium sulfate bolus IVPB 2 g (03/22/21 0842)   potassium chloride 10 mEq (03/22/21 0839)   remdesivir 100 mg in NS 100 mL 100 mg (03/21/21 0944)   PRN Meds:.chlorpheniramine-HYDROcodone, dextrose, docusate sodium, guaiFENesin-dextromethorphan, polyethylene glycol  Assessment & Plan:   Diabetic Ketoacidosis  in the setting of +COVID& Influenza Infection       -s/p phase 1-3 DKA protocol with substantial improvement   Acute hypoxic respiratory Failure secondary to COVID-19 & Influenza Infection  -Supplemental O2 as needed to maintain O2 saturations 88 to 92% -Monitor fever curve -s/p Remdesivir and Tamiflu -Hold steroids in the setting of DKA -s/p abx Cefepime/ Vancomycin/ Metronidazole may need to de-escalate if no identifiable infectious process -Inhalers with flutter valve -Daily CRP, CMP, CBC, D-dimer, Inflammatory markers -Echocardiogram  -Vitamins (Zinc and Vitamin C)  Acute on Chronic CKD stage III Hyponatremia- likely pseudohyponatremia in the setting of DKA correct for hyperglycemia Hyperkalemia corrected with sodium bicarb, calcium gluconate and Insulin -Monitor I&O's / urinary output -Follow BMP -Ensure adequate renal perfusion -Avoid nephrotoxic agents as able -Replace electrolytes as indicated   Hypertension -Resume home med Amlodipine once able to take po and BP permits -PRN Labetalol and Hydralazine  Best practice:  Diet:  NPO Pain/Anxiety/Delirium protocol (if indicated): No VAP protocol (if indicated): Not indicated DVT prophylaxis: Subcutaneous Heparin GI prophylaxis: N/A Glucose control:  Insulin gtt Central venous access:  Yes, and it is still needed Arterial line:  N/A Foley:  Yes, and it is still needed Mobility:  bed rest  PT consulted: N/A Last date of multidisciplinary goals of care discussion [11/21] Code Status:  full code Disposition: ICU   = Goals of Care = Code Status Order: FULL  Primary Emergency Contact: [place],Latisha Wishes to pursue full aggressive treatment and intervention options, including CPR and intubation, but goals of care will be addressed on going with family if that should become necessary.   Critical care provider statement:   Total critical care time: 33 minutes   Performed by: Lanney Gins  MD   Critical care time was exclusive of separately billable procedures and treating other patients.   Critical care was necessary to treat or prevent imminent or life-threatening deterioration.   Critical care was time spent personally by me on the following activities: development of treatment plan with patient and/or surrogate as well as nursing, discussions with consultants, evaluation of patient's response to treatment, examination of patient, obtaining history from patient or surrogate, ordering and performing treatments and interventions, ordering and review of laboratory studies, ordering and review of radiographic studies, pulse oximetry and re-evaluation of patient's condition.    Ottie Glazier, M.D.  Pulmonary & Critical Care Medicine

## 2021-03-22 NOTE — Consult Note (Signed)
PHARMACY CONSULT NOTE - FOLLOW UP  Pharmacy Consult for Electrolyte Monitoring and Replacement   Recent Labs: Potassium (mmol/L)  Date Value  03/22/2021 3.2 (L)  11/12/2013 4.2   Magnesium (mg/dL)  Date Value  03/22/2021 1.5 (L)  11/11/2013 1.5 (L)   Calcium (mg/dL)  Date Value  03/22/2021 7.3 (L)   Calcium, Total (mg/dL)  Date Value  11/12/2013 8.2 (L)   Albumin (g/dL)  Date Value  03/21/2021 2.5 (L)  09/08/2019 2.9 (L)  11/10/2013 3.9   Phosphorus (mg/dL)  Date Value  03/22/2021 3.1   Sodium (mmol/L)  Date Value  03/22/2021 139  09/08/2019 140  11/12/2013 137     Assessment: 30yo female w/ h/o T1DM (on insulin pump) presenting in DKA c/b sepsis (Flu-A positive; Covid positive) and AKI on CKD3. Pharmacy consulted for electrolyte mgmt.  Scr (BL 1.8-2.3)4.18>3.58>2.85 K: 7.1>3.9>3.5>3.2 Na: 139 (normalized with gluc) - gluc 1191>897>571> (120-140s) Mg: 1.8>1.5 Phos:5.9>3.1   Goal of Therapy:  Lytes WNL  Plan:  Pt converted to SQ insulin from gtt on AM of 11/23 & c/w LR @125ml /h; AG: 19>10>7 K: 3.5>3.2.  Replete modestly with KCL IV 60mEq q1h x2 (MD ordered). Mg: 1.8>1.5 Replete with MgSO4 IV 2g x1 (MD ordered)  Other lytes WNL, not req'ing  repletion at this time. Will CTM ISO AKI and newly resolved DKA.  Lorna Dibble, PharmD, [facility] Clinical Pharmacist 03/22/2021 9:27 AM

## 2021-03-22 NOTE — Progress Notes (Addendum)
Inpatient Diabetes Program Recommendations  AACE/ADA: New Consensus Statement on Inpatient Glycemic Control   Target Ranges:  Prepandial:   less than 140 mg/dL      Peak postprandial:   less than 180 mg/dL (1-2 hours)      Critically ill patients:  140 - 180 mg/dL    Latest Reference Range & Units 03/22/21 01:11 03/22/21 01:52 03/22/21 04:15 03/22/21 05:34 03/22/21 05:56 03/22/21 07:48  Glucose-Capillary 70 - 99 mg/dL 179 (H) 151 (H) 59 (L) 39 (LL) 164 (H) 167 (H)    Latest Reference Range & Units 03/21/21 00:02  Glucose 70 - 99 mg/dL 1,095 [facility])    Latest Reference Range & Units 07/15/20 14:14 12/08/20 04:17 03/07/21 16:10  Hemoglobin A1C 4.8 - 5.6 % 9.4 ! 13.0 (H) 10.5 (H)  !: Data is abnormal (H): Data is abnormally high  Review of Glycemic Control  Diabetes history: DM1 (does NOT make any insulin; requires basal, correction, and carb coverage insulin) Outpatient Diabetes medications: OmniPod insulin pump with Novolog  Current orders for Inpatient glycemic control: Semglee 13 units Q24H, Novolog 0-9 units Q4H   Inpatient Diabetes Program Recommendations:     Insulin: Please consider decreasing Semglee to 12 units Q24H, decreasign Novolog correction to 0-6 units and change to AC&HS and consider adding Novolog 3 units TID with meals if patient eats at least 50% of meals.    NOTE: Patient has Type 1 DM and has had multiple admissions for DKA (most recent hospitalization 03/07/21-03/09/21. Inpatient diabetes coordinator spoke with patient on 03/08/21 during last hospitalization and on 12/08/20 during another admission. Patient's Endocrinologist is Dr. Kelton Pillar and patient's last appointment was 12/16/20.   Per office note on 12/16/20, the following should be insulin pump settings:   Basal Rates 00:00   0.5 units/hr 08:00   0:6 units/hr Total Basal per day: 13.6 units/24 hours   Insulin to Carb Ratio 1:12 (1 unit covers 12 grams of carbs)   Insulin Sensitivity 1:65 (1 unit drops  glucose 65 mg/dl) Target glucose: 100 mg/dl   Per H&P on 03/21/21, patient admitted with DKA, COVID and Flu, and respiratory failure. Initial glucose 1191 mg/dl and patient was started on IV insulin which was transitioned to SQ insulin last night (received Semglee 13 units at 23:48. Would recommend using SQ insulin regimen while inpatient.  Addendum 03/22/21@11 :45-Spoke with patient at bedside regarding DM and home regimen. Patient very drowsy and kept eyes closed during most of conversation but answered questions appropriately.  Patient states that she uses an OmniPod insulin pump with Novolog and that she sees Dr. Kelton Pillar for DM management. Patient states she had a scheduled appointment with Dr. Kelton Pillar last week but she missed it due to being sick. Patient reports that she was using her insulin pump for DM control and she states it had been doing good but it started going up and when she removed her insulin pump at home that the cannula was kinked.  Patient states she does not have any insulin pump supplies with her here at the hospital. She states that she has some OmniPod pods and Novolog at home but she notes she needs to go ahead and call to get refills on her pump supplies and Dexcom CGM sensors.  Patient reports that her glucose is usually good, "in 100-200's" when she has on her insulin pump. Discussed recurrent DKA and multiple hospital admissions for DKA.  Discussed A1C results (10.5% on 03/07/21) and explained that most current A1C indicates an average glucose of  255 mg/dl over the past 2-3 months. Patient reports that her prior A1C was 13% so most current A1C is an improvement. Discussed glucose and A1C goals. Discussed importance of checking CBGs and maintaining good CBG control to prevent long-term and short-term complications. Explained how hyperglycemia leads to damage within blood vessels which lead to the common complications seen with uncontrolled diabetes. Stressed to the patient the  importance of improving glycemic control to prevent further complications from uncontrolled diabetes.Patient reports that she does not needs prescriptions for any DM medication or supplies at time of discharge. Encouraged patient to call Dr. Quin Hoop office to reschedule the appointment she missed last week. Patient verbalized understanding of information discussed and reports no further questions at this time related to diabetes.   Thanks, Barnie Alderman, RN, MSN, CDE Diabetes Coordinator Inpatient Diabetes Program [phone] (Team Pager from 8am to 5pm)

## 2021-03-23 LAB — CBC WITH DIFFERENTIAL/PLATELET
Abs Immature Granulocytes: 0.33 10*3/uL — ABNORMAL HIGH (ref 0.00–0.07)
Basophils Absolute: 0 10*3/uL (ref 0.0–0.1)
Basophils Relative: 0 %
Eosinophils Absolute: 0 10*3/uL (ref 0.0–0.5)
Eosinophils Relative: 0 %
HCT: 22.9 % — ABNORMAL LOW (ref 36.0–46.0)
Hemoglobin: 7.8 g/dL — ABNORMAL LOW (ref 12.0–15.0)
Immature Granulocytes: 2 %
Lymphocytes Relative: 23 %
Lymphs Abs: 3.3 10*3/uL (ref 0.7–4.0)
MCH: 26.1 pg (ref 26.0–34.0)
MCHC: 34.1 g/dL (ref 30.0–36.0)
MCV: 76.6 fL — ABNORMAL LOW (ref 80.0–100.0)
Monocytes Absolute: 0.9 10*3/uL (ref 0.1–1.0)
Monocytes Relative: 7 %
Neutro Abs: 9.8 10*3/uL — ABNORMAL HIGH (ref 1.7–7.7)
Neutrophils Relative %: 68 %
Platelets: 293 10*3/uL (ref 150–400)
RBC: 2.99 MIL/uL — ABNORMAL LOW (ref 3.87–5.11)
RDW: 13.2 % (ref 11.5–15.5)
WBC: 14.4 10*3/uL — ABNORMAL HIGH (ref 4.0–10.5)
nRBC: 0 % (ref 0.0–0.2)

## 2021-03-23 LAB — BASIC METABOLIC PANEL
Anion gap: 5 (ref 5–15)
BUN: 45 mg/dL — ABNORMAL HIGH (ref 6–20)
CO2: 21 mmol/L — ABNORMAL LOW (ref 22–32)
Calcium: 7.4 mg/dL — ABNORMAL LOW (ref 8.9–10.3)
Chloride: 111 mmol/L (ref 98–111)
Creatinine, Ser: 2 mg/dL — ABNORMAL HIGH (ref 0.44–1.00)
GFR, Estimated: 34 mL/min — ABNORMAL LOW (ref >60)
Glucose, Bld: 99 mg/dL (ref 70–99)
Potassium: 3.5 mmol/L (ref 3.5–5.1)
Sodium: 137 mmol/L (ref 135–145)

## 2021-03-23 LAB — D-DIMER, QUANTITATIVE: D-Dimer, Quant: 1.68 ug/mL-FEU — ABNORMAL HIGH (ref 0.00–0.50)

## 2021-03-23 LAB — GLUCOSE, CAPILLARY
Glucose-Capillary: 66 mg/dL — ABNORMAL LOW (ref 70–99)
Glucose-Capillary: 137 mg/dL — ABNORMAL HIGH (ref 70–99)
Glucose-Capillary: 111 mg/dL — ABNORMAL HIGH (ref 70–99)
Glucose-Capillary: 102 mg/dL — ABNORMAL HIGH (ref 70–99)
Glucose-Capillary: 115 mg/dL — ABNORMAL HIGH (ref 70–99)

## 2021-03-23 LAB — MAGNESIUM: Magnesium: 1.6 mg/dL — ABNORMAL LOW (ref 1.7–2.4)

## 2021-03-23 LAB — PHOSPHORUS: Phosphorus: 2.9 mg/dL (ref 2.5–4.6)

## 2021-03-23 LAB — C-REACTIVE PROTEIN: CRP: 0.6 mg/dL (ref <1.0)

## 2021-03-23 LAB — FERRITIN: Ferritin: 536 ng/mL — ABNORMAL HIGH (ref 11–307)

## 2021-03-23 MED ORDER — MAGNESIUM SULFATE 2 GM/50ML IV SOLN
2.0000 g | Freq: Once | INTRAVENOUS | Status: AC
  Administered 2021-03-23: 2 g via INTRAVENOUS
  Filled 2021-03-23: qty 50

## 2021-03-23 MED ORDER — POTASSIUM CHLORIDE 20 MEQ PO PACK
20.0000 meq | PACK | Freq: Once | ORAL | Status: AC
  Administered 2021-03-23: 20 meq via ORAL
  Filled 2021-03-23: qty 1

## 2021-03-23 NOTE — Progress Notes (Signed)
NAME:  Ashley Freeman, MRN:  [id number], DOB:  Sep 29, 1990, LOS: 3 ADMISSION DATE:  03/20/2021, CONSULTATION DATE:  03/20/2021 REFERRING MD: Delman Kitten, MD CHIEF COMPLAINT: DKA    HPI  30 y.o with significant PMH of right gluteal abscess, Anemia of Chronic Disease, Bartholin's glands abscess, Bacterial Vaginosis, Gonorrhea, Trichomoniasis, type I DM, multiple episodes of DKA, hyperlipidemia, history of sepsis, and stage 3a CKD who presented to the ED with chief complaints of generalized weakness and hyperglycemia.  Patient unable to provide history due to acute illness and lethargy. History is limited to ED and EMS report. Per ED notes, EMS was called by patient's family due to worsening fatigue and weakness. No reports of nausea, vomiting, abdominal pain, diarrhea, SOB, chest pain, fevers, chills or cough. Patient was recently discharged from [facility] on 03/09/2021 following admission for severe DKA.  03/22/21- patient is clinically improved. Diabetic coordinator modified regimen today. She is eating PO now.  03/23/21- patient is improved and will be optimized for [facility] transfer.  Past Medical History   Right Gluteal Abscess  08/24/2013  AKI (acute kidney injury) ([place]) 07/26/2014  Anemia 02/19/2012  Bartholin's gland abscess 09/19/2013  BV (bacterial vaginosis) 11/24/2015  Diabetes mellitus type I ([place]) 2001  Diagnosed at age 60 ; Type I  Diarrhea 05/30/2016  DKA (diabetic ketoacidoses) 08/19/2013  Also in 2018  Gonorrhea 08/2011  Treated in 09/2011  History of trichomoniasis 05/31/2016  Hyperlipidemia 03/28/2016  Sepsis ([place]) 09/19/2013    Procedures:  11/22> Left Femoral Venous Catheter  Significant Diagnostic Tests:  11/21: Chest Xray>No active cardiopulmonary process  Micro Data:  11/21: SARS-CoV-2 PCR> POSITIVE 11/21: Influenza A PCR> POSITIVE 11/21: Blood culture x2> 11/21: Urine Culture> 11/21: MRSA PCR>>   Antimicrobials:  Vancomycin  11/22> Cefepime 11/22> Metronidazole 11/22>  OBJECTIVE  Blood pressure 134/83, pulse (!) 110, temperature 98.2 F (36.8 C), temperature source Oral, resp. rate 18, height '5\' 3"'  (1.6 m), weight 61.2 kg, SpO2 98 %, unknown if currently breastfeeding.        Intake/Output Summary (Last 24 hours) at 03/23/2021 1229 Last data filed at 03/23/2021 1200 Gross per 24 hour  Intake 3570.97 ml  Output 1350 ml  Net 2220.97 ml    Filed Weights   03/21/21 0251 03/22/21 0500 03/23/21 0500  Weight: 56.1 kg 59.4 kg 61.2 kg     Physical Examination  GENERAL: 30 year-old age appropriate nAD EYES: Pupils equal, round, reactive to light and accommodation. No scleral icterus. Extraocular muscles intact.  HEENT: Head atraumatic, normocephalic. Oropharynx and nasopharynx clear.  NECK:  Supple, no jugular venous distention. No thyroid enlargement, no tenderness.  LUNGS: Normal breath sounds bilaterally, no wheezing, rales,rhonchi or crepitation. No use of accessory muscles of respiration.  CARDIOVASCULAR: S1, S2 normal. No murmurs, rubs, or gallops.  ABDOMEN: Soft, nontender, nondistended. Bowel sounds present. No organomegaly or mass.  EXTREMITIES: Mild pedal edema, cyanosis, or clubbing.  NEUROLOGIC: Cranial nerves II through XII are intact.  Muscle strength not checked. Sensation intact. Gait not checked.  PSYCHIATRIC: The patient is alert and oriented x 3.  SKIN: No obvious rash, lesion, or ulcer.   Labs/imaging that I havepersonally reviewed  (right click and "Reselect all SmartList Selections" daily)     Labs   CBC: Recent Labs  Lab 03/20/21 2110 03/21/21 0424 03/21/21 1516 03/22/21 0231 03/23/21 0257  WBC 29.3* 31.5*  --  23.8* 14.4*  NEUTROABS  --  23.4*  --  17.9* 9.8*  HGB 9.0* 6.8* 7.1* 7.4*  7.8*  HCT 32.9* 21.5* 20.4* 21.1* 22.9*  MCV 95.1 82.7  --  75.6* 76.6*  PLT 400 301  --  331 293     Basic Metabolic Panel: Recent Labs  Lab 03/21/21 0424 03/21/21 1050  03/21/21 1516 03/21/21 1813 03/21/21 2148 03/22/21 0210 03/23/21 0257  NA 136   < > 137 139 140 139 137  K 3.9   < > 3.4* 3.4* 3.5 3.2* 3.5  CL 106   < > 109 111 114* 112* 111  CO2 11*   < > 19* 18* 20* 20* 21*  GLUCOSE 897*   < > 318* 315* 125* 148* 99  BUN 73*   < > 76* 68* 62* 65* 45*  CREATININE 3.58*   < > 3.23* 3.01* 2.81* 2.85* 2.00*  CALCIUM 6.8*   < > 7.2* 7.2* 7.2* 7.3* 7.4*  MG 1.8  --   --   --   --  1.5* 1.6*  PHOS 5.9*  --   --   --   --  3.1 2.9   < > = values in this interval not displayed.    GFR: Estimated Creatinine Clearance: 34.6 mL/min (A) (by C-G formula based on SCr of 2 mg/dL (H)). Recent Labs  Lab 03/20/21 2110 03/20/21 2111 03/21/21 0216 03/21/21 0424 03/22/21 0231 03/23/21 0257  PROCALCITON 9.49  --   --   --   --   --   WBC 29.3*  --   --  31.5* 23.8* 14.4*  LATICACIDVEN  --  3.8* 3.5* 3.0*  --   --      Liver Function Tests: Recent Labs  Lab 03/21/21 0002 03/21/21 0216  AST 41 42*  ALT 31 31  ALKPHOS 77 74  BILITOT 1.4* 1.5*  PROT 6.3* 6.0*  ALBUMIN 2.7* 2.5*    Recent Labs  Lab 03/21/21 0002  LIPASE 26    No results for input(s): AMMONIA in the last 168 hours.  ABG    Component Value Date/Time   PHART 7.22 (L) 03/21/2021 0355   PCO2ART 19 (LL) 03/21/2021 0355   PO2ART 46 (L) 03/21/2021 0355   HCO3 7.8 (L) 03/21/2021 0355   TCO2 13 (L) 10/06/2020 1020   ACIDBASEDEF 18.2 (H) 03/21/2021 0355   O2SAT 71.2 03/21/2021 0355      Coagulation Profile: No results for input(s): INR, PROTIME in the last 168 hours.  Cardiac Enzymes: No results for input(s): CKTOTAL, CKMB, CKMBINDEX, TROPONINI in the last 168 hours.  HbA1C: Hemoglobin A1C  Date/Time Value Ref Range Status  11/11/2013 04:31 AM 14.6 (H) 4.2 - 6.3 % Final    Comment:    The American Diabetes Association recommends that a primary goal of therapy should be <7% and that physicians should reevaluate the treatment regimen in patients with HbA1c values  consistently >8%.   09/12/2011 02:53 AM SEE COMMENT 4.2 - 6.3 % Final    Comment:    The American Diabetes Association recommends that a primary goal of therapy should be <7% and that physicians should reevaluate the treatment regimen in patients with HbA1c values consistently >8%. HGB A1C - Unable to perform testing at [facility] due  - to interfering substance. HGB A1C - H -- 16.8 %  - Reference Range: 4.8-5.6  - ----------------------------------------  - Increased risk for diabetes: 5.7-6.4  - Diabetes: >6.4  - Glycemic control for adults with  - .[name]... diabetes: <7.0  - ----------------------------------------  - PERFORMED BY [facility]  - SPEC.NO.: [id number]  -  [address],[place] [address]  - Lindon Romp, MD  - [phone]    HbA1c, POC (controlled diabetic range)  Date/Time Value Ref Range Status  06/04/2018 09:29 AM   Final    Comment:    >15   Hgb A1c MFr Bld  Date/Time Value Ref Range Status  03/07/2021 04:10 PM 10.5 (H) 4.8 - 5.6 % Final    Comment:    (NOTE)         Prediabetes: 5.7 - 6.4         Diabetes: >6.4         Glycemic control for adults with diabetes: <7.0   12/08/2020 04:17 AM 13.0 (H) 4.8 - 5.6 % Final    Comment:    (NOTE) Pre diabetes:          5.7%-6.4%  Diabetes:              >6.4%  Glycemic control for   <7.0% adults with diabetes     CBG: Recent Labs  Lab 03/22/21 1529 03/22/21 2246 03/23/21 0725 03/23/21 0817 03/23/21 1131  GLUCAP 71 102* 66* 137* 111*     Review of Systems:   UNABLE TO [place]  Past Medical History  She,  has a past medical history of Abscess, gluteal, right (08/24/2013), AKI (acute kidney injury) ([place]) (07/26/2014), Anemia (02/19/2012), Bartholin's gland abscess (09/19/2013), BV (bacterial vaginosis) (11/24/2015), Diabetes mellitus type I ([place]) (2001), Diarrhea (05/30/2016), DKA (diabetic ketoacidoses) (08/19/2013), Gonorrhea (08/2011),  History of trichomoniasis (05/31/2016), Hyperlipidemia (03/28/2016), and Sepsis ([place]) (09/19/2013).   Surgical History    Past Surgical History:  Procedure Laterality Date   CESAREAN SECTION N/A 10/05/2019   Procedure: CESAREAN SECTION;  Surgeon: Aletha Halim, MD;  Location: [facility];  Service: Obstetrics;  Laterality: N/A;   INCISION AND DRAINAGE ABSCESS Left 09/28/2019   Procedure: INCISION AND DRAINAGE VULVAR ABCESS;  Surgeon: [name], MD;  Location: [place];  Service: Gynecology;  Laterality: Left;   INCISION AND DRAINAGE PERIRECTAL ABSCESS Right 08/18/2013   Procedure: IRRIGATION AND DEBRIDEMENT GLUTEAL ABSCESS;  Surgeon: Ralene Ok, MD;  Location: [place];  Service: General;  Laterality: Right;   INCISION AND DRAINAGE PERIRECTAL ABSCESS Right 09/19/2013   Procedure: IRRIGATION AND DEBRIDEMENT RIGHT GLUTEAL AND LABIAL ABSCESSES;  Surgeon: Ralene Ok, MD;  Location: [place];  Service: General;  Laterality: Right;   INCISION AND DRAINAGE PERIRECTAL ABSCESS Right 09/24/2013   Procedure: IRRIGATION AND DEBRIDEMENT PERIRECTAL ABSCESS;  Surgeon: Gwenyth Ober, MD;  Location: [place];  Service: General;  Laterality: Right;     Social History   reports that she has never smoked. She has never used smokeless tobacco. She reports that she does not currently use alcohol. She reports that she does not use drugs.   Family History   Her family history includes Asthma in her mother; Carpal tunnel syndrome in her mother; Diabetes in her paternal grandmother; Gout in her father. There is no history of Anesthesia problems.   Allergies Allergies  Allergen Reactions   Cephalexin Anaphylaxis   Penicillins Hives and Rash    Has patient had a PCN reaction causing immediate rash, facial/tongue/throat swelling, SOB or lightheadedness with hypotension: Yes Has patient had a PCN reaction causing severe rash involving mucus membranes or skin necrosis: No Has patient had a PCN reaction that required  hospitalization: Yes Has patient had a PCN reaction occurring within the last 10 years: No Spoke with pt - childhood hives told  by mom, tried no pcns since, doesn't remember reaction herself    Benadryl [Diphenhydramine] Itching   Doxycycline Itching     Home Medications  Prior to Admission medications   Medication Sig Start Date End Date Taking? Authorizing Provider  albuterol (VENTOLIN HFA) 108 (90 Base) MCG/ACT inhaler Inhale 2 puffs into the lungs every 4 (four) hours as needed for wheezing or shortness of breath. 08/09/20   [provider]  amLODipine (NORVASC) 5 MG tablet Take 2 tablets (10 mg total) by mouth daily. 03/10/21   Mariel Aloe, MD  blood glucose meter kit and supplies KIT Dispense based on patient and insurance preference. Use up to four times daily as directed. (FOR ICD-9 250.00, 250.01). 05/25/17   Isla Pence, MD  Blood Pressure Monitoring (BLOOD PRESSURE KIT) DEVI 1 Device by Does not apply route as needed. 05/06/19   Anyanwu, Sallyanne Havers, MD  Continuous Blood Gluc Sensor (DEXCOM G6 SENSOR) MISC Inject 1 Device into the skin as directed. 12/16/20   Shamleffer, Melanie Crazier, MD  Continuous Blood Gluc Transmit (DEXCOM G6 TRANSMITTER) MISC Inject 1 Device into the skin as directed. Use to check blood sugar daily 12/16/20   Shamleffer, Melanie Crazier, MD  insulin aspart (NOVOLOG) 100 UNIT/ML FlexPen 0-9 Units, Subcutaneous, 3 times daily with meals, CBG < 70: Implement Hypoglycemia measures CBG 70 - 120: 0 units CBG 121 - 150: 1 unit CBG 151 - 200: 2 units CBG 201 - 250: 3 units CBG 251 - 300: 5 units CBG 301 - 350: 7 units CBG 351 - 400: 9 units CBG > 400: call MD Patient taking differently: Inject 0-9 Units into the skin See admin instructions. 0-9 Units, Subcutaneous, 3 times daily with meals, CBG < 70: Implement Hypoglycemia measures CBG 70 - 120: 0 units CBG 121 - 150: 1 unit CBG 151 - 200: 2 units CBG 201 - 250: 3 units CBG 251 - 300: 5 units CBG 301 - 350: 7  units CBG 351 - 400: 9 units CBG > 400: call MD. Using pump. 01/30/21   Shamleffer, Melanie Crazier, MD  Insulin Disposable Pump (OMNIPOD 5 G6 INTRO, GEN 5,) KIT 1 Device by Does not apply route every 3 (three) days. Patient taking differently: 1 Device by Does not apply route every 3 (three) days. 100 units for 72 hours 12/16/20   Shamleffer, Melanie Crazier, MD  Insulin Disposable Pump (OMNIPOD 5 G6 POD, GEN 5,) MISC 1 Device by Does not apply route every 3 (three) days. Patient not taking: No sig reported 12/16/20   Shamleffer, Melanie Crazier, MD  Insulin Pen Needle 32G X 4 MM MISC Use as directed 10/07/20   Ghimire, Henreitta Leber, MD  lamoTRIgine (LAMICTAL) 100 MG tablet Take 100 mg by mouth daily. 02/28/21   [provider]  Ferrous Sulfate (IRON) 325 (65 Fe) MG TABS Take 1 tablet (325 mg total) by mouth every other day. Patient not taking: Reported on 07/25/2020 06/17/19 07/25/20  Chauncey Mann, MD  hydrochlorothiazide (HYDRODIURIL) 25 MG tablet Take 1 tablet (25 mg total) by mouth daily. Patient not taking: No sig reported 10/08/19 07/25/20  Constant, Peggy, MD  promethazine (PHENERGAN) 25 MG tablet Take 1 tablet (25 mg total) by mouth every 8 (eight) hours as needed for nausea or vomiting (if zofran fails). Patient not taking: Reported on 10/06/2020 08/25/20 10/06/20  Varney Biles, MD    Scheduled Meds:  albuterol  4 puff Inhalation Q6H   vitamin C  500 mg Oral Daily  Chlorhexidine Gluconate Cloth  6 each Topical Daily   heparin injection (subcutaneous)  5,000 Units Subcutaneous Q8H   insulin aspart  0-6 Units Subcutaneous TID AC & HS   insulin aspart  3 Units Subcutaneous TID WC   insulin glargine-yfgn  12 Units Subcutaneous Q24H   ipratropium  1 puff Inhalation Q6H   multivitamin with minerals  1 tablet Oral Daily   oseltamivir  30 mg Oral Daily   pantoprazole (PROTONIX) IV  40 mg Intravenous Q24H   Ensure Max Protein  11 oz Oral BID   trimethobenzamide  200 mg Intramuscular TID    zinc sulfate  220 mg Oral Daily   Continuous Infusions:  lactated ringers Stopped (03/23/21 1143)   magnesium sulfate bolus IVPB 50 mL/hr at 03/23/21 1200   remdesivir 100 mg in NS 100 mL Stopped (03/23/21 1100)   PRN Meds:.chlorpheniramine-HYDROcodone, dextrose, docusate sodium, guaiFENesin-dextromethorphan, polyethylene glycol  Assessment & Plan:  Diabetic Ketoacidosis  in the setting of +COVID& Influenza Infection       -s/p phase 1-3 DKA protocol with substantial improvement   Acute hypoxic respiratory Failure secondary to COVID-19 & Influenza Infection  -Supplemental O2 as needed to maintain O2 saturations 88 to 92% -Monitor fever curve -s/p Remdesivir and Tamiflu -Hold steroids in the setting of DKA -s/p abx Cefepime/ Vancomycin/ Metronidazole may need to de-escalate if no identifiable infectious process -Inhalers with flutter valve -Daily CRP, CMP, CBC, D-dimer, Inflammatory markers -Echocardiogram  -Vitamins (Zinc and Vitamin C)  Acute on Chronic CKD stage III Hyponatremia- likely pseudohyponatremia in the setting of DKA correct for hyperglycemia Hyperkalemia corrected with sodium bicarb, calcium gluconate and Insulin -Monitor I&O's / urinary output -Follow BMP -Ensure adequate renal perfusion -Avoid nephrotoxic agents as able -Replace electrolytes as indicated   Hypertension -Resume home med Amlodipine once able to take po and BP permits -PRN Labetalol and Hydralazine  Best practice:  Diet:  NPO Pain/Anxiety/Delirium protocol (if indicated): No VAP protocol (if indicated): Not indicated DVT prophylaxis: Subcutaneous Heparin GI prophylaxis: N/A Glucose control:  Insulin gtt Central venous access:  Yes, and it is still needed Arterial line:  N/A Foley:  Yes, and it is still needed Mobility:  bed rest  PT consulted: N/A Last date of multidisciplinary goals of care discussion [11/21] Code Status:  full code Disposition: ICU   = Goals of Care = Code Status  Order: FULL  Primary Emergency Contact: [place],Latisha Wishes to pursue full aggressive treatment and intervention options, including CPR and intubation, but goals of care will be addressed on going with family if that should become necessary.   Critical care provider statement:   Total critical care time: 33 minutes   Performed by: Lanney Gins MD   Critical care time was exclusive of separately billable procedures and treating other patients.   Critical care was necessary to treat or prevent imminent or life-threatening deterioration.   Critical care was time spent personally by me on the following activities: development of treatment plan with patient and/or surrogate as well as nursing, discussions with consultants, evaluation of patient's response to treatment, examination of patient, obtaining history from patient or surrogate, ordering and performing treatments and interventions, ordering and review of laboratory studies, ordering and review of radiographic studies, pulse oximetry and re-evaluation of patient's condition.    Ottie Glazier, M.D.  Pulmonary & Critical Care Medicine

## 2021-03-23 NOTE — Consult Note (Signed)
PHARMACY CONSULT NOTE - FOLLOW UP  Pharmacy Consult for Electrolyte Monitoring and Replacement   Recent Labs: Potassium (mmol/L)  Date Value  03/23/2021 3.5  11/12/2013 4.2   Magnesium (mg/dL)  Date Value  03/23/2021 1.6 (L)  11/11/2013 1.5 (L)   Calcium (mg/dL)  Date Value  03/23/2021 7.4 (L)   Calcium, Total (mg/dL)  Date Value  11/12/2013 8.2 (L)   Albumin (g/dL)  Date Value  03/21/2021 2.5 (L)  09/08/2019 2.9 (L)  11/10/2013 3.9   Phosphorus (mg/dL)  Date Value  03/23/2021 2.9   Sodium (mmol/L)  Date Value  03/23/2021 137  09/08/2019 140  11/12/2013 137     Assessment: 30yo female w/ h/o T1DM (on insulin pump) presenting in DKA c/b sepsis (Flu-A positive; Covid positive) and AKI on CKD3. Pharmacy consulted for electrolyte mgmt.   Goal of Therapy:  [place] WNL  Plan:  Will give Mg 2 g x 1 and Kcl 20 mEq x 1 PO.  F/u with AM labs.   Eleonore Chiquito, PharmD,  Clinical Pharmacist 03/23/2021 10:15 AM

## 2021-03-24 DIAGNOSIS — E1010 Type 1 diabetes mellitus with ketoacidosis without coma: Secondary | ICD-10-CM

## 2021-03-24 DIAGNOSIS — N179 Acute kidney failure, unspecified: Secondary | ICD-10-CM

## 2021-03-24 DIAGNOSIS — U071 COVID-19: Secondary | ICD-10-CM

## 2021-03-24 LAB — BASIC METABOLIC PANEL
Anion gap: 5 (ref 5–15)
BUN: 33 mg/dL — ABNORMAL HIGH (ref 6–20)
CO2: 24 mmol/L (ref 22–32)
Calcium: 7.8 mg/dL — ABNORMAL LOW (ref 8.9–10.3)
Chloride: 109 mmol/L (ref 98–111)
Creatinine, Ser: 1.44 mg/dL — ABNORMAL HIGH (ref 0.44–1.00)
GFR, Estimated: 51 mL/min — ABNORMAL LOW (ref >60)
Glucose, Bld: 155 mg/dL — ABNORMAL HIGH (ref 70–99)
Potassium: 3.7 mmol/L (ref 3.5–5.1)
Sodium: 138 mmol/L (ref 135–145)

## 2021-03-24 LAB — CBC WITH DIFFERENTIAL/PLATELET
Abs Immature Granulocytes: 0.04 10*3/uL (ref 0.00–0.07)
Basophils Absolute: 0 10*3/uL (ref 0.0–0.1)
Basophils Relative: 0 %
Eosinophils Absolute: 0.1 10*3/uL (ref 0.0–0.5)
Eosinophils Relative: 1 %
HCT: 26.5 % — ABNORMAL LOW (ref 36.0–46.0)
Hemoglobin: 8.6 g/dL — ABNORMAL LOW (ref 12.0–15.0)
Immature Granulocytes: 1 %
Lymphocytes Relative: 53 %
Lymphs Abs: 3.3 10*3/uL (ref 0.7–4.0)
MCH: 25.9 pg — ABNORMAL LOW (ref 26.0–34.0)
MCHC: 32.5 g/dL (ref 30.0–36.0)
MCV: 79.8 fL — ABNORMAL LOW (ref 80.0–100.0)
Monocytes Absolute: 0.5 10*3/uL (ref 0.1–1.0)
Monocytes Relative: 9 %
Neutro Abs: 2.2 10*3/uL (ref 1.7–7.7)
Neutrophils Relative %: 36 %
Platelets: 264 10*3/uL (ref 150–400)
RBC: 3.32 MIL/uL — ABNORMAL LOW (ref 3.87–5.11)
RDW: 13.8 % (ref 11.5–15.5)
WBC: 6.2 10*3/uL (ref 4.0–10.5)
nRBC: 0 % (ref 0.0–0.2)

## 2021-03-24 LAB — MAGNESIUM: Magnesium: 1.8 mg/dL (ref 1.7–2.4)

## 2021-03-24 LAB — GLUCOSE, CAPILLARY
Glucose-Capillary: 140 mg/dL — ABNORMAL HIGH (ref 70–99)
Glucose-Capillary: 134 mg/dL — ABNORMAL HIGH (ref 70–99)
Glucose-Capillary: 144 mg/dL — ABNORMAL HIGH (ref 70–99)

## 2021-03-24 LAB — C-REACTIVE PROTEIN: CRP: 0.8 mg/dL (ref <1.0)

## 2021-03-24 LAB — PHOSPHORUS: Phosphorus: 3 mg/dL (ref 2.5–4.6)

## 2021-03-24 LAB — FERRITIN: Ferritin: 487 ng/mL — ABNORMAL HIGH (ref 11–307)

## 2021-03-24 LAB — D-DIMER, QUANTITATIVE: D-Dimer, Quant: 1.44 ug/mL-FEU — ABNORMAL HIGH (ref 0.00–0.50)

## 2021-03-24 MED ORDER — IPRATROPIUM BROMIDE HFA 17 MCG/ACT IN AERS: 1.0000 | INHALATION_SPRAY | Freq: Four times a day (QID) | RESPIRATORY_TRACT | 0 refills | Status: DC

## 2021-03-24 MED ORDER — OSELTAMIVIR PHOSPHATE 30 MG PO CAPS
30.0000 mg | ORAL_CAPSULE | Freq: Two times a day (BID) | ORAL | Status: DC
  Filled 2021-03-24: qty 1

## 2021-03-24 MED ORDER — OSELTAMIVIR PHOSPHATE 30 MG PO CAPS
30.0000 mg | ORAL_CAPSULE | Freq: Two times a day (BID) | ORAL | 0 refills | Status: AC
Start: 2021-03-24 — End: 2021-03-26

## 2021-03-24 MED ORDER — AMLODIPINE BESYLATE 10 MG PO TABS: 10.0000 mg | ORAL_TABLET | Freq: Every day | ORAL | 0 refills | Status: DC

## 2021-03-24 NOTE — Progress Notes (Addendum)
NAME:  Ashley Freeman, MRN:  [id number], DOB:  08-18-1990, LOS: 4 ADMISSION DATE:  03/20/2021, CONSULTATION DATE:  03/20/2021 REFERRING MD: Delman Kitten, MD CHIEF COMPLAINT: DKA    HPI  30 y.o with significant PMH of right gluteal abscess, Anemia of Chronic Disease, Bartholin's glands abscess, Bacterial Vaginosis, Gonorrhea, Trichomoniasis, type I DM, multiple episodes of DKA, hyperlipidemia, history of sepsis, and stage 3a CKD who presented to the ED with chief complaints of generalized weakness and hyperglycemia.  Patient unable to provide history due to acute illness and lethargy. History is limited to ED and EMS report. Per ED notes, EMS was called by patient's family due to worsening fatigue and weakness. No reports of nausea, vomiting, abdominal pain, diarrhea, SOB, chest pain, fevers, chills or cough. Patient was recently discharged from [facility] on 03/09/2021 following admission for severe DKA.  03/22/21- patient is clinically improved. Diabetic coordinator modified regimen today. She is eating PO now.  03/23/21- patient is improved,  PCCM will sign off. TRH Dr Ouida Sills taking over today.   Past Medical History   Right Gluteal Abscess  08/24/2013  AKI (acute kidney injury) ([place]) 07/26/2014  Anemia 02/19/2012  Bartholin's gland abscess 09/19/2013  BV (bacterial vaginosis) 11/24/2015  Diabetes mellitus type I ([place]) 2001  Diagnosed at age 66 ; Type I  Diarrhea 05/30/2016  DKA (diabetic ketoacidoses) 08/19/2013  Also in 2018  Gonorrhea 08/2011  Treated in 09/2011  History of trichomoniasis 05/31/2016  Hyperlipidemia 03/28/2016  Sepsis ([place]) 09/19/2013    Procedures:  11/22> Left Femoral Venous Catheter  Significant Diagnostic Tests:  11/21: Chest Xray>No active cardiopulmonary process  Micro Data:  11/21: SARS-CoV-2 PCR> POSITIVE 11/21: Influenza A PCR> POSITIVE 11/21: Blood culture x2> 11/21: Urine Culture> 11/21: MRSA PCR>>   Antimicrobials:   Vancomycin 11/22> Cefepime 11/22> Metronidazole 11/22>  OBJECTIVE  Blood pressure (!) 146/86, pulse 97, temperature 98.6 F (37 C), temperature source Oral, resp. rate 18, height '5\' 3"'  (1.6 m), weight 61.8 kg, SpO2 100 %, unknown if currently breastfeeding.        Intake/Output Summary (Last 24 hours) at 03/24/2021 0910 Last data filed at 03/24/2021 4098 Gross per 24 hour  Intake 2951.17 ml  Output 1204 ml  Net 1747.17 ml    Filed Weights   03/22/21 0500 03/23/21 0500 03/24/21 0447  Weight: 59.4 kg 61.2 kg 61.8 kg     Physical Examination  GENERAL: 30 year-old age appropriate nAD EYES: Pupils equal, round, reactive to light and accommodation. No scleral icterus. Extraocular muscles intact.  HEENT: Head atraumatic, normocephalic. Oropharynx and nasopharynx clear.  NECK:  Supple, no jugular venous distention. No thyroid enlargement, no tenderness.  LUNGS: Normal breath sounds bilaterally, no wheezing, rales,rhonchi or crepitation. No use of accessory muscles of respiration.  CARDIOVASCULAR: S1, S2 normal. No murmurs, rubs, or gallops.  ABDOMEN: Soft, nontender, nondistended. Bowel sounds present. No organomegaly or mass.  EXTREMITIES: Mild pedal edema, cyanosis, or clubbing.  NEUROLOGIC: Cranial nerves II through XII are intact.  Muscle strength not checked. Sensation intact. Gait not checked.  PSYCHIATRIC: The patient is alert and oriented x 3.  SKIN: No obvious rash, lesion, or ulcer.   Labs/imaging that I havepersonally reviewed  (right click and "Reselect all SmartList Selections" daily)     Labs   CBC: Recent Labs  Lab 03/20/21 2110 03/21/21 0424 03/21/21 1516 03/22/21 0231 03/23/21 0257  WBC 29.3* 31.5*  --  23.8* 14.4*  NEUTROABS  --  23.4*  --  17.9* 9.8*  HGB 9.0* 6.8* 7.1* 7.4* 7.8*  HCT 32.9* 21.5* 20.4* 21.1* 22.9*  MCV 95.1 82.7  --  75.6* 76.6*  PLT 400 301  --  331 293     Basic Metabolic Panel: Recent Labs  Lab 03/21/21 0424 03/21/21 1050  03/21/21 1516 03/21/21 1813 03/21/21 2148 03/22/21 0210 03/23/21 0257  NA 136   < > 137 139 140 139 137  K 3.9   < > 3.4* 3.4* 3.5 3.2* 3.5  CL 106   < > 109 111 114* 112* 111  CO2 11*   < > 19* 18* 20* 20* 21*  GLUCOSE 897*   < > 318* 315* 125* 148* 99  BUN 73*   < > 76* 68* 62* 65* 45*  CREATININE 3.58*   < > 3.23* 3.01* 2.81* 2.85* 2.00*  CALCIUM 6.8*   < > 7.2* 7.2* 7.2* 7.3* 7.4*  MG 1.8  --   --   --   --  1.5* 1.6*  PHOS 5.9*  --   --   --   --  3.1 2.9   < > = values in this interval not displayed.    GFR: Estimated Creatinine Clearance: 34.6 mL/min (A) (by C-G formula based on SCr of 2 mg/dL (H)). Recent Labs  Lab 03/20/21 2110 03/20/21 2111 03/21/21 0216 03/21/21 0424 03/22/21 0231 03/23/21 0257  PROCALCITON 9.49  --   --   --   --   --   WBC 29.3*  --   --  31.5* 23.8* 14.4*  LATICACIDVEN  --  3.8* 3.5* 3.0*  --   --      Liver Function Tests: Recent Labs  Lab 03/21/21 0002 03/21/21 0216  AST 41 42*  ALT 31 31  ALKPHOS 77 74  BILITOT 1.4* 1.5*  PROT 6.3* 6.0*  ALBUMIN 2.7* 2.5*    Recent Labs  Lab 03/21/21 0002  LIPASE 26    No results for input(s): AMMONIA in the last 168 hours.  ABG    Component Value Date/Time   PHART 7.22 (L) 03/21/2021 0355   PCO2ART 19 (LL) 03/21/2021 0355   PO2ART 46 (L) 03/21/2021 0355   HCO3 7.8 (L) 03/21/2021 0355   TCO2 13 (L) 10/06/2020 1020   ACIDBASEDEF 18.2 (H) 03/21/2021 0355   O2SAT 71.2 03/21/2021 0355      Coagulation Profile: No results for input(s): INR, PROTIME in the last 168 hours.  Cardiac Enzymes: No results for input(s): CKTOTAL, CKMB, CKMBINDEX, TROPONINI in the last 168 hours.  HbA1C: Hemoglobin A1C  Date/Time Value Ref Range Status  11/11/2013 04:31 AM 14.6 (H) 4.2 - 6.3 % Final    Comment:    The American Diabetes Association recommends that a primary goal of therapy should be <7% and that physicians should reevaluate the treatment regimen in patients with HbA1c values  consistently >8%.   09/12/2011 02:53 AM SEE COMMENT 4.2 - 6.3 % Final    Comment:    The American Diabetes Association recommends that a primary goal of therapy should be <7% and that physicians should reevaluate the treatment regimen in patients with HbA1c values consistently >8%. HGB A1C - Unable to perform testing at [facility] due  - to interfering substance. HGB A1C - H -- 16.8 %  - Reference Range: 4.8-5.6  - ----------------------------------------  - Increased risk for diabetes: 5.7-6.4  - Diabetes: >6.4  - Glycemic control for adults with  - .[name]... diabetes: <7.0  - ----------------------------------------  - PERFORMED BY [facility]  -  SPEC.NO.: [id number]  - [place] [id number]  - Lindon Romp, MD  - [phone]    HbA1c, POC (controlled diabetic range)  Date/Time Value Ref Range Status  06/04/2018 09:29 AM   Final    Comment:    >15   Hgb A1c MFr Bld  Date/Time Value Ref Range Status  03/07/2021 04:10 PM 10.5 (H) 4.8 - 5.6 % Final    Comment:    (NOTE)         Prediabetes: 5.7 - 6.4         Diabetes: >6.4         Glycemic control for adults with diabetes: <7.0   12/08/2020 04:17 AM 13.0 (H) 4.8 - 5.6 % Final    Comment:    (NOTE) Pre diabetes:          5.7%-6.4%  Diabetes:              >6.4%  Glycemic control for   <7.0% adults with diabetes     CBG: Recent Labs  Lab 03/23/21 1131 03/23/21 1624 03/23/21 2220 03/24/21 0447 03/24/21 0842  GLUCAP 111* 102* 115* 140* 134*     Review of Systems:   UNABLE TO OBTAIN FROM PATIENT POOR HISTORIAN AND LETHARGIC  Past Medical History  She,  has a past medical history of Abscess, gluteal, right (08/24/2013), AKI (acute kidney injury) ([place]) (07/26/2014), Anemia (02/19/2012), Bartholin's gland abscess (09/19/2013), BV (bacterial vaginosis) (11/24/2015), Diabetes mellitus type I ([place]) (2001), Diarrhea (05/30/2016), DKA (diabetic ketoacidoses) (08/19/2013), Gonorrhea (08/2011),  History of trichomoniasis (05/31/2016), Hyperlipidemia (03/28/2016), and Sepsis ([place]) (09/19/2013).   Surgical History    Past Surgical History:  Procedure Laterality Date   CESAREAN SECTION N/A 10/05/2019   Procedure: CESAREAN SECTION;  Surgeon: Aletha Halim, MD;  Location: [facility];  Service: Obstetrics;  Laterality: N/A;   INCISION AND DRAINAGE ABSCESS Left 09/28/2019   Procedure: INCISION AND DRAINAGE VULVAR ABCESS;  Surgeon: [name], MD;  Location: [place];  Service: Gynecology;  Laterality: Left;   INCISION AND DRAINAGE PERIRECTAL ABSCESS Right 08/18/2013   Procedure: IRRIGATION AND DEBRIDEMENT GLUTEAL ABSCESS;  Surgeon: Ralene Ok, MD;  Location: [place];  Service: General;  Laterality: Right;   INCISION AND DRAINAGE PERIRECTAL ABSCESS Right 09/19/2013   Procedure: IRRIGATION AND DEBRIDEMENT RIGHT GLUTEAL AND LABIAL ABSCESSES;  Surgeon: Ralene Ok, MD;  Location: [place];  Service: General;  Laterality: Right;   INCISION AND DRAINAGE PERIRECTAL ABSCESS Right 09/24/2013   Procedure: IRRIGATION AND DEBRIDEMENT PERIRECTAL ABSCESS;  Surgeon: Gwenyth Ober, MD;  Location: [place];  Service: General;  Laterality: Right;     Social History   reports that she has never smoked. She has never used smokeless tobacco. She reports that she does not currently use alcohol. She reports that she does not use drugs.   Family History   Her family history includes Asthma in her mother; Carpal tunnel syndrome in her mother; Diabetes in her paternal grandmother; Gout in her father. There is no history of Anesthesia problems.   Allergies Allergies  Allergen Reactions   Cephalexin Anaphylaxis   Penicillins Hives and Rash    Has patient had a PCN reaction causing immediate rash, facial/tongue/throat swelling, SOB or lightheadedness with hypotension: Yes Has patient had a PCN reaction causing severe rash involving mucus membranes or skin necrosis: No Has patient had a PCN reaction that required  hospitalization: Yes Has patient had a PCN reaction occurring within the last 10 years: No Spoke with pt -  childhood hives told by mom, tried no pcns since, doesn't remember reaction herself    Benadryl [Diphenhydramine] Itching   Doxycycline Itching     Home Medications  Prior to Admission medications   Medication Sig Start Date End Date Taking? Authorizing Provider  albuterol (VENTOLIN HFA) 108 (90 Base) MCG/ACT inhaler Inhale 2 puffs into the lungs every 4 (four) hours as needed for wheezing or shortness of breath. 08/09/20   [provider]  amLODipine (NORVASC) 5 MG tablet Take 2 tablets (10 mg total) by mouth daily. 03/10/21   Mariel Aloe, MD  blood glucose meter kit and supplies KIT Dispense based on patient and insurance preference. Use up to four times daily as directed. (FOR ICD-9 250.00, 250.01). 05/25/17   Isla Pence, MD  Blood Pressure Monitoring (BLOOD PRESSURE KIT) DEVI 1 Device by Does not apply route as needed. 05/06/19   Anyanwu, Sallyanne Havers, MD  Continuous Blood Gluc Sensor (DEXCOM G6 SENSOR) MISC Inject 1 Device into the skin as directed. 12/16/20   Shamleffer, Melanie Crazier, MD  Continuous Blood Gluc Transmit (DEXCOM G6 TRANSMITTER) MISC Inject 1 Device into the skin as directed. Use to check blood sugar daily 12/16/20   Shamleffer, Melanie Crazier, MD  insulin aspart (NOVOLOG) 100 UNIT/ML FlexPen 0-9 Units, Subcutaneous, 3 times daily with meals, CBG < 70: Implement Hypoglycemia measures CBG 70 - 120: 0 units CBG 121 - 150: 1 unit CBG 151 - 200: 2 units CBG 201 - 250: 3 units CBG 251 - 300: 5 units CBG 301 - 350: 7 units CBG 351 - 400: 9 units CBG > 400: call MD Patient taking differently: Inject 0-9 Units into the skin See admin instructions. 0-9 Units, Subcutaneous, 3 times daily with meals, CBG < 70: Implement Hypoglycemia measures CBG 70 - 120: 0 units CBG 121 - 150: 1 unit CBG 151 - 200: 2 units CBG 201 - 250: 3 units CBG 251 - 300: 5 units CBG 301 - 350: 7  units CBG 351 - 400: 9 units CBG > 400: call MD. Using pump. 01/30/21   Shamleffer, Melanie Crazier, MD  Insulin Disposable Pump (OMNIPOD 5 G6 INTRO, GEN 5,) KIT 1 Device by Does not apply route every 3 (three) days. Patient taking differently: 1 Device by Does not apply route every 3 (three) days. 100 units for 72 hours 12/16/20   Shamleffer, Melanie Crazier, MD  Insulin Disposable Pump (OMNIPOD 5 G6 POD, GEN 5,) MISC 1 Device by Does not apply route every 3 (three) days. Patient not taking: No sig reported 12/16/20   Shamleffer, Melanie Crazier, MD  Insulin Pen Needle 32G X 4 MM MISC Use as directed 10/07/20   Ghimire, Henreitta Leber, MD  lamoTRIgine (LAMICTAL) 100 MG tablet Take 100 mg by mouth daily. 02/28/21   [provider]  Ferrous Sulfate (IRON) 325 (65 Fe) MG TABS Take 1 tablet (325 mg total) by mouth every other day. Patient not taking: Reported on 07/25/2020 06/17/19 07/25/20  Chauncey Mann, MD  hydrochlorothiazide (HYDRODIURIL) 25 MG tablet Take 1 tablet (25 mg total) by mouth daily. Patient not taking: No sig reported 10/08/19 07/25/20  Constant, Peggy, MD  promethazine (PHENERGAN) 25 MG tablet Take 1 tablet (25 mg total) by mouth every 8 (eight) hours as needed for nausea or vomiting (if zofran fails). Patient not taking: Reported on 10/06/2020 08/25/20 10/06/20  Varney Biles, MD    Scheduled Meds:  albuterol  4 puff Inhalation Q6H   vitamin C  500 mg  Oral Daily   Chlorhexidine Gluconate Cloth  6 each Topical Daily   heparin injection (subcutaneous)  5,000 Units Subcutaneous Q8H   insulin aspart  0-6 Units Subcutaneous TID AC & HS   insulin aspart  3 Units Subcutaneous TID WC   insulin glargine-yfgn  12 Units Subcutaneous Q24H   ipratropium  1 puff Inhalation Q6H   multivitamin with minerals  1 tablet Oral Daily   oseltamivir  30 mg Oral Daily   pantoprazole (PROTONIX) IV  40 mg Intravenous Q24H   Ensure Max Protein  11 oz Oral BID   trimethobenzamide  200 mg Intramuscular TID    zinc sulfate  220 mg Oral Daily   Continuous Infusions:  remdesivir 100 mg in NS 100 mL Stopped (03/23/21 1100)   PRN Meds:.chlorpheniramine-HYDROcodone, dextrose, docusate sodium, guaiFENesin-dextromethorphan, polyethylene glycol  Assessment & Plan:  Diabetic Ketoacidosis  in the setting of +COVID& Influenza Infection-RESOLVED       -s/p phase 1-3 DKA protocol with substantial improvement   Acute hypoxic respiratory Failure secondary to COVID-19 & Influenza Infection -RESOLVED -Supplemental O2 as needed to maintain O2 saturations 88 to 92% -Monitor fever curve -s/p Remdesivir and Tamiflu -Hold steroids in the setting of DKA -s/p abx Cefepime/ Vancomycin/ Metronidazole may need to de-escalate if no identifiable infectious process -Inhalers with flutter valve -Daily CRP, CMP, CBC, D-dimer, Inflammatory markers -Echocardiogram  -Vitamins (Zinc and Vitamin C)  Acute on Chronic CKD stage III- RESOLVED Hyponatremia- likely pseudohyponatremia in the setting of DKA correct for hyperglycemia Hyperkalemia corrected with sodium bicarb, calcium gluconate and Insulin -Monitor I&O's / urinary output -Follow BMP -Ensure adequate renal perfusion -Avoid nephrotoxic agents as able -Replace electrolytes as indicated   Hypertension -Resume home med Amlodipine once able to take po and BP permits -PRN Labetalol and Hydralazine  Best practice:  Diet:  NPO Pain/Anxiety/Delirium protocol (if indicated): No VAP protocol (if indicated): Not indicated DVT prophylaxis: Subcutaneous Heparin GI prophylaxis: N/A Glucose control:  Insulin gtt Central venous access:  Yes, and it is still needed Arterial line:  N/A Foley:  Yes, and it is still needed Mobility:  bed rest  PT consulted: N/A  Code Status:  full code Disposition: home   = Goals of Care = Code Status Order: FULL  Primary Emergency Contact: [place],Latisha Wishes to pursue full aggressive treatment and intervention options,  including CPR and intubation, but goals of care will be addressed on going with family if that should become necessary.     Ottie Glazier, M.D.  Pulmonary & Critical Care Medicine

## 2021-03-24 NOTE — Consult Note (Signed)
PHARMACY CONSULT NOTE - FOLLOW UP  Pharmacy Consult for Electrolyte Monitoring and Replacement   Recent Labs: Potassium (mmol/L)  Date Value  03/24/2021 3.7  11/12/2013 4.2   Magnesium (mg/dL)  Date Value  03/24/2021 1.8  11/11/2013 1.5 (L)   Calcium (mg/dL)  Date Value  03/24/2021 7.8 (L)   Calcium, Total (mg/dL)  Date Value  11/12/2013 8.2 (L)   Albumin (g/dL)  Date Value  03/21/2021 2.5 (L)  09/08/2019 2.9 (L)  11/10/2013 3.9   Phosphorus (mg/dL)  Date Value  03/24/2021 3.0   Sodium (mmol/L)  Date Value  03/24/2021 138  09/08/2019 140  11/12/2013 137     Assessment: 30yo female w/ h/o T1DM (on insulin pump) presenting in DKA c/b sepsis (Flu-A positive; Covid positive) and AKI on CKD3. Pharmacy consulted for electrolyte mgmt.   Goal of Therapy:  Lytes WNL  Plan:  All lytes WNL and renal fxn recovered [Scr (BL 1.8-2.3) 4.18>3.58>2.85>1.44] No repletion req'd at this time F/u with AM labs.   Lorna Dibble, PharmD, [facility] Clinical Pharmacist 03/24/2021 1:06 PM

## 2021-03-24 NOTE — Progress Notes (Signed)
Pt ambulatory without assist, reports feeling improved, NAD.  IV removed, intact, bleeding controlled.  Pt educated on discharge and follow up instruction, pt verbalized understanding, no new questions.  Pt to return home via private vehicle with family, escorted to medical mall via wheelchair, accompanied by RN.

## 2021-03-24 NOTE — Progress Notes (Incomplete)
PROGRESS NOTE  Ashley Freeman    DOB: Sep 07, 1990, 30 y.o.  NWG:[id number]  PCP: [name], PA-C   Code Status: Full Code   DOA: 03/20/2021   LOS: 4  Brief Narrative of Current Hospitalization  Ashley Freeman is a 30 y.o. female with a PMH significant for ***. They presented from *** to the ED on 03/20/2021 with *** x***days. In the ED, it was found that they had ***. They were treated with ***.  Patient was admitted to medicine service for further workup and management of *** as outlined in detail below.  03/24/21 -***  Assessment & Plan  Active Problems:   DKA, type 1 (HCC)   *** -   *** -   *** -   *** -   *** -   DVT prophylaxis: heparin injection 5,000 Units Start: 03/21/21 1400   Diet:  Diet Orders (From admission, onward)     Start     Ordered   03/21/21 2249  Diet Carb Modified Fluid consistency: Thin; Room service appropriate? Yes  Diet effective now       Question Answer Comment  Diet-HS Snack? Nothing   Calorie Level Medium 1600-2000   Fluid consistency: Thin   Room service appropriate? Yes      03/21/21 2252            Subjective 03/24/21    Pt reports ***  Disposition Plan & Communication  Patient status: Inpatient  Admitted From: {From:23814} Disposition: {PLAN; DISPOSITION:26386} Anticipated discharge date: ***  Family Communication: ***  Consults, Procedures, Significant Events  Consultants:  ***  Procedures/significant events:  *** Antimicrobials:  Anti-infectives (From admission, onward)    Start     Dose/Rate Route Frequency Ordered Stop   03/21/21 1015  oseltamivir (TAMIFLU) capsule 30 mg        30 mg Oral Daily 03/21/21 0927 03/26/21 0959   03/21/21 1000  oseltamivir (TAMIFLU) capsule 30 mg  Status:  Discontinued        30 mg Oral 2 times daily 03/20/21 2337 03/21/21 0927   03/21/21 1000  remdesivir 100 mg in sodium chloride 0.9 % 100 mL IVPB       See Hyperspace for full Linked  Orders Report.   100 mg 200 mL/hr over 30 Minutes Intravenous Daily 03/20/21 2337 03/25/21 0959   03/21/21 0245  ceFEPIme (MAXIPIME) 2 g in sodium chloride 0.9 % 100 mL IVPB  Status:  Discontinued        2 g 200 mL/hr over 30 Minutes Intravenous Every 24 hours 03/21/21 0146 03/21/21 1006   03/21/21 0230  vancomycin (VANCOREADY) IVPB 1250 mg/250 mL        1,250 mg 166.7 mL/hr over 90 Minutes Intravenous  Once 03/21/21 0137 03/21/21 0408   03/21/21 0230  metroNIDAZOLE (FLAGYL) IVPB 500 mg  Status:  Discontinued        500 mg 100 mL/hr over 60 Minutes Intravenous Every 12 hours 03/21/21 0144 03/21/21 1006   03/21/21 0147  vancomycin variable dose per unstable renal function (pharmacist dosing)  Status:  Discontinued         Does not apply See admin instructions 03/21/21 0147 03/21/21 1006   03/21/21 0000  oseltamivir (TAMIFLU) capsule 75 mg  Status:  Discontinued        75 mg Oral  Once 03/20/21 2347 03/21/21 0927   03/20/21 2345  remdesivir 200 mg in sodium chloride 0.9% 250 mL IVPB       See  Hyperspace for full Linked Orders Report.   200 mg 580 mL/hr over 30 Minutes Intravenous Once 03/20/21 2337 03/21/21 0341       Objective   Vitals:   03/24/21 0447 03/24/21 0500 03/24/21 0530 03/24/21 0600  BP:      Pulse:  89 86 97  Resp:      Temp:      TempSrc:      SpO2:  98% 97% 100%  Weight: 61.8 kg     Height:        Intake/Output Summary (Last 24 hours) at 03/24/2021 0740 Last data filed at 03/24/2021 1941 Gross per 24 hour  Intake 3226.06 ml  Output 1554 ml  Net 1672.06 ml   Filed Weights   03/22/21 0500 03/23/21 0500 03/24/21 0447  Weight: 59.4 kg 61.2 kg 61.8 kg    Patient BMI: Body mass index is 24.13 kg/m.   Physical Exam: General: awake, alert, NAD HEENT: atraumatic, clear conjunctiva, anicteric sclera, ***moist mucus membranes, hearing grossly normal Respiratory: normal respiratory effort. Cardiovascular: normal S1/S2, *** RRR, no JVD, murmurs, rubs, gallops,  ***quick capillary refill  Gastrointestinal: soft, NT, ND, no HSM felt Nervous: A&O x3***. no gross focal neurologic deficits, normal speech Extremities: moves all equally***, no*** edema, normal tone Skin: dry, intact, normal temperature, normal color, ***No rashes, lesions or ulcers Psychiatry: normal mood, congruent affect  Labs   I have personally reviewed following labs and imaging studies No results displayed because visit has over 200 results.      Imaging Studies  No results found. Medications   Scheduled Meds:  albuterol  4 puff Inhalation Q6H   vitamin C  500 mg Oral Daily   Chlorhexidine Gluconate Cloth  6 each Topical Daily   heparin injection (subcutaneous)  5,000 Units Subcutaneous Q8H   insulin aspart  0-6 Units Subcutaneous TID AC & HS   insulin aspart  3 Units Subcutaneous TID WC   insulin glargine-yfgn  12 Units Subcutaneous Q24H   ipratropium  1 puff Inhalation Q6H   multivitamin with minerals  1 tablet Oral Daily   oseltamivir  30 mg Oral Daily   pantoprazole (PROTONIX) IV  40 mg Intravenous Q24H   Ensure Max Protein  11 oz Oral BID   trimethobenzamide  200 mg Intramuscular TID   zinc sulfate  220 mg Oral Daily   No recently discontinued medications to reconcile  LOS: 4 days   Time spent: >4min  Richarda Osmond, DO [facility] 03/24/2021, 7:40 AM   Please refer to amion to contact the [facility] Attending or Consulting provider for this pt  [email or web address] Available by Epic secure chat 7AM-7PM. If 7PM-7AM, please contact night-coverage

## 2021-03-24 NOTE — Discharge Instructions (Signed)
Complete your covid treatment. Continue to take 10mg  tablets of amlodipine daily for your blood pressure Take insulin as instructed

## 2021-03-24 NOTE — Discharge Summary (Addendum)
Physician Discharge Summary  Ashley Freeman RSW:[id number] DOB: Sep 02, 1990 DOA: 03/20/2021  PCP: [name], PA-C  Admit date: 03/20/2021 Discharge date: 03/24/2021  Admitted From: Home Disposition: Home  Recommendations for Outpatient Follow-up:  Follow up with PCP within 1-2 weeks to monitor for COVID symptom resolution as well as diabetes management.  Would recommend BMP to monitor kidney function  Discharge Condition:stable, improved CODE STATUS:  Code Status: Prior  Diabetic diet  Brief/Interim Summary: Pt presented with DKA in setting of COVID-19 and influenza infection. Her Covid symptoms were mild and she completed oseltamivir treatment.  She received insulin gtt with fluid hydration to correct her DKA and progressed in typical fashion. She was able to tolerate regular PO diet on day of discharge without N/V, abdominal pain. She received counseling/education on monitoring her blood sugars at home.  Her discharge was delayed for resolution of AKI on CKD3a in setting of dehydration in acute illness. On day of discharge, her Cr/GFR had returned back to baseline and patient had good UOP.   Discharge Diagnoses:  Active Problems:   DKA, type 1 ([place])   COVID-19   Diabetic ketoacidosis without coma associated with type 1 diabetes mellitus [facility])  Discharge Instructions     Discharge patient   Complete by: As directed    Discharge disposition: 01-Home or Self Care   Discharge patient date: 03/24/2021      Allergies as of 03/24/2021       Reactions   Cephalexin Anaphylaxis   Penicillins Hives, Rash   Has patient had a PCN reaction causing immediate rash, facial/tongue/throat swelling, SOB or lightheadedness with hypotension: Yes Has patient had a PCN reaction causing severe rash involving mucus membranes or skin necrosis: No Has patient had a PCN reaction that required hospitalization: Yes Has patient had a PCN reaction occurring within the last 10 years:  No Spoke with pt - childhood hives told by mom, tried no pcns since, doesn't remember reaction herself    Benadryl [diphenhydramine] Itching   Doxycycline Itching        Medication List     TAKE these medications    albuterol 108 (90 Base) MCG/ACT inhaler Commonly known as: VENTOLIN HFA Inhale 2 puffs into the lungs every 4 (four) hours as needed for wheezing or shortness of breath.   amLODipine 10 MG tablet Commonly known as: NORVASC Take 1 tablet (10 mg total) by mouth daily. What changed: medication strength   blood glucose meter kit and supplies Kit Dispense based on patient and insurance preference. Use up to four times daily as directed. (FOR ICD-9 250.00, 250.01).   Blood Pressure Kit Devi 1 Device by Does not apply route as needed.   Dexcom G6 Sensor Misc Inject 1 Device into the skin as directed.   Dexcom G6 Transmitter Misc Inject 1 Device into the skin as directed. Use to check blood sugar daily   insulin aspart 100 UNIT/ML FlexPen Commonly known as: NOVOLOG 0-9 Units, Subcutaneous, 3 times daily with meals, CBG < 70: Implement Hypoglycemia measures CBG 70 - 120: 0 units CBG 121 - 150: 1 unit CBG 151 - 200: 2 units CBG 201 - 250: 3 units CBG 251 - 300: 5 units CBG 301 - 350: 7 units CBG 351 - 400: 9 units CBG > 400: call MD What changed:  how much to take how to take this when to take this additional instructions   ipratropium 17 MCG/ACT inhaler Commonly known as: ATROVENT HFA Inhale 1 puff into the  lungs every 6 (six) hours.   lamoTRIgine 100 MG tablet Commonly known as: LAMICTAL Take 100 mg by mouth daily.   Omnipod 5 G6 Intro (Gen 5) Kit 1 Device by Does not apply route every 3 (three) days. What changed: additional instructions   Omnipod 5 G6 Pod (Gen 5) Misc 1 Device by Does not apply route every 3 (three) days. What changed: Another medication with the same name was changed. Make sure you understand how and when to take each.   oseltamivir  30 MG capsule Commonly known as: TAMIFLU Take 1 capsule (30 mg total) by mouth 2 (two) times daily for 2 days.   Pentips 32G X 4 MM Misc Generic drug: Insulin Pen Needle Use as directed        Allergies  Allergen Reactions   Cephalexin Anaphylaxis   Penicillins Hives and Rash    Has patient had a PCN reaction causing immediate rash, facial/tongue/throat swelling, SOB or lightheadedness with hypotension: Yes Has patient had a PCN reaction causing severe rash involving mucus membranes or skin necrosis: No Has patient had a PCN reaction that required hospitalization: Yes Has patient had a PCN reaction occurring within the last 10 years: No Spoke with pt - childhood hives told by mom, tried no pcns since, doesn't remember reaction herself    Benadryl [Diphenhydramine] Itching   Doxycycline Itching    Consultations: CCM  Procedures/Studies: DG Chest Portable 1 View  Result Date: 03/20/2021 CLINICAL DATA:  Acidosis.  COVID positive. EXAM: PORTABLE CHEST 1 VIEW COMPARISON:  Radiograph 03/07/2021 FINDINGS: The cardiomediastinal contours are normal. The lungs are clear. Pulmonary vasculature is normal. No consolidation, pleural effusion, or pneumothorax. No acute osseous abnormalities are seen. IMPRESSION: No acute chest findings. Electronically Signed   By: Keith Rake M.D.   On: 03/20/2021 22:57   DG Chest Portable 1 View  Result Date: 03/07/2021 CLINICAL DATA:  Chest pain. EXAM: PORTABLE CHEST 1 VIEW COMPARISON:  December 07, 2020. FINDINGS: The heart size and mediastinal contours are within normal limits. Both lungs are clear. The visualized skeletal structures are unremarkable. IMPRESSION: No active disease. Electronically Signed   By: Marijo Conception M.D.   On: 03/07/2021 10:28    Subjective: Patient feels well overall. She is tolerating diet. Denies complaints or questions at time of discharge.   Discharge Exam: Vitals:   03/24/21 0800 03/24/21 0847  BP:  (!) 159/105   Pulse: (!) 110 94  Resp:    Temp: 98.9 F (37.2 C)   SpO2: 100% 100%    General: Pt is alert, awake, not in acute distress Cardiovascular: RRR, S1/S2 +, no rubs, no gallops Respiratory: CTA bilaterally, no wheezing, no rhonchi Abdominal: Soft, NT, ND, bowel sounds + Extremities: no edema, no cyanosis  Labs: Basic Metabolic Panel: Recent Labs  Lab 03/21/21 0424 03/21/21 1050 03/21/21 1813 03/21/21 2148 03/22/21 0210 03/23/21 0257 03/24/21 0918  NA 136   < > 139 140 139 137 138  K 3.9   < > 3.4* 3.5 3.2* 3.5 3.7  CL 106   < > 111 114* 112* 111 109  CO2 11*   < > 18* 20* 20* 21* 24  GLUCOSE 897*   < > 315* 125* 148* 99 155*  BUN 73*   < > 68* 62* 65* 45* 33*  CREATININE 3.58*   < > 3.01* 2.81* 2.85* 2.00* 1.44*  CALCIUM 6.8*   < > 7.2* 7.2* 7.3* 7.4* 7.8*  MG 1.8  --   --   --  1.5* 1.6* 1.8  PHOS 5.9*  --   --   --  3.1 2.9 3.0   < > = values in this interval not displayed.   CBC: Recent Labs  Lab 03/20/21 2110 03/21/21 0424 03/21/21 1516 03/22/21 0231 03/23/21 0257 03/24/21 0918  WBC 29.3* 31.5*  --  23.8* 14.4* 6.2  NEUTROABS  --  23.4*  --  17.9* 9.8* 2.2  HGB 9.0* 6.8* 7.1* 7.4* 7.8* 8.6*  HCT 32.9* 21.5* 20.4* 21.1* 22.9* 26.5*  MCV 95.1 82.7  --  75.6* 76.6* 79.8*  PLT 400 301  --  331 293 264    Microbiology Recent Results (from the past 240 hour(s))  Resp Panel by RT-PCR (Flu A&B, Covid) Nasopharyngeal Swab     Status: Abnormal   Collection Time: 03/20/21  9:11 PM   Specimen: Nasopharyngeal Swab; Nasopharyngeal(NP) swabs in vial transport medium  Result Value Ref Range Status   SARS Coronavirus 2 by RT PCR POSITIVE (A) NEGATIVE Final    Comment: RESULT CALLED TO, READ BACK BY AND VERIFIED WITH: MORGAN CATEF AT 2223 ON 03/20/21 BY SS (NOTE) SARS-CoV-2 target nucleic acids are DETECTED.  The SARS-CoV-2 RNA is generally detectable in upper respiratory specimens during the acute phase of infection. Positive results are indicative of the presence  of the identified virus, but do not rule out bacterial infection or co-infection with other pathogens not detected by the test. Clinical correlation with patient history and other diagnostic information is necessary to determine patient infection status. The expected result is Negative.  Fact Sheet for Patients: EntrepreneurPulse.com.au  Fact Sheet for Healthcare Providers: IncredibleEmployment.be  This test is not yet approved or cleared by the [place] FDA and  has been authorized for detection and/or diagnosis of SARS-CoV-2 by FDA under an Emergency Use Authorization (EUA).  This EUA will remain in effect (meaning this test c an be used) for the duration of  the COVID-19 declaration under Section 564(b)(1) of the Act, 21 U.S.C. section 360bbb-3(b)(1), unless the authorization is terminated or revoked sooner.     Influenza A by PCR POSITIVE (A) NEGATIVE Final   Influenza B by PCR NEGATIVE NEGATIVE Final    Comment: (NOTE) The Xpert Xpress SARS-CoV-2/FLU/RSV plus assay is intended as an aid in the diagnosis of influenza from Nasopharyngeal swab specimens and should not be used as a sole basis for treatment. Nasal washings and aspirates are unacceptable for Xpert Xpress SARS-CoV-2/FLU/RSV testing.  Fact Sheet for Patients: EntrepreneurPulse.com.au  Fact Sheet for Healthcare Providers: IncredibleEmployment.be  This test is not yet approved or cleared by the [place] FDA and has been authorized for detection and/or diagnosis of SARS-CoV-2 by FDA under an Emergency Use Authorization (EUA). This EUA will remain in effect (meaning this test can be used) for the duration of the COVID-19 declaration under Section 564(b)(1) of the Act, 21 U.S.C. section 360bbb-3(b)(1), unless the authorization is terminated or revoked.  Performed at [facility], [address] [place], [place] [address]    Blood culture (routine x 2)     Status: None   Collection Time: 03/20/21 10:33 PM   Specimen: BLOOD RIGHT HAND  Result Value Ref Range Status   Specimen Description BLOOD RIGHT HAND  Final   Special Requests   Final    BOTTLES DRAWN AEROBIC AND ANAEROBIC Blood Culture adequate volume   Culture   Final    NO GROWTH 5 DAYS Performed at [facility], [address] [place], [place] [address]  Report Status 03/25/2021 FINAL  Final  Blood culture (routine x 2)     Status: None   Collection Time: 03/20/21 11:10 PM   Specimen: BLOOD RIGHT HAND  Result Value Ref Range Status   Specimen Description BLOOD RIGHT HAND  Final   Special Requests   Final    BOTTLES DRAWN AEROBIC ONLY Blood Culture adequate volume   Culture   Final    NO GROWTH 5 DAYS Performed at [facility], [address] [place], [place] [address]    Report Status 03/26/2021 FINAL  Final  Respiratory (~20 pathogens) panel by PCR     Status: Abnormal   Collection Time: 03/20/21 11:34 PM   Specimen: Nasopharyngeal Swab; Respiratory  Result Value Ref Range Status   Adenovirus NOT DETECTED NOT DETECTED Final   Coronavirus 229E NOT DETECTED NOT DETECTED Final    Comment: (NOTE) The Coronavirus on the Respiratory Panel, DOES NOT test for the novel  Coronavirus (2019 nCoV)    Coronavirus HKU1 NOT DETECTED NOT DETECTED Final   Coronavirus NL63 NOT DETECTED NOT DETECTED Final   Coronavirus OC43 NOT DETECTED NOT DETECTED Final   Metapneumovirus NOT DETECTED NOT DETECTED Final   Rhinovirus / Enterovirus NOT DETECTED NOT DETECTED Final   Influenza A H3 DETECTED (A) NOT DETECTED Final   Influenza B NOT DETECTED NOT DETECTED Final   Parainfluenza Virus 1 NOT DETECTED NOT DETECTED Final   Parainfluenza Virus 2 NOT DETECTED NOT DETECTED Final   Parainfluenza Virus 3 NOT DETECTED NOT DETECTED Final   Parainfluenza Virus 4 NOT DETECTED NOT DETECTED Final   Respiratory Syncytial Virus NOT DETECTED NOT  DETECTED Final   Bordetella pertussis NOT DETECTED NOT DETECTED Final   Bordetella Parapertussis NOT DETECTED NOT DETECTED Final   Chlamydophila pneumoniae NOT DETECTED NOT DETECTED Final   Mycoplasma pneumoniae NOT DETECTED NOT DETECTED Final    Comment: Performed at [facility], [address] [place], [place] [address]  Urine Culture     Status: None   Collection Time: 03/21/21  1:19 AM   Specimen: Urine, Random  Result Value Ref Range Status   Specimen Description   Final    URINE, RANDOM Performed at [facility], [address] [place], [place] [address]    Special Requests   Final    NONE Performed at [facility], [address] [place], [place] [address]    Culture   Final    NO GROWTH Performed at [facility], [place] [address], [place], [place] [address]    Report Status 03/21/2021 FINAL  Final  MRSA Next Gen by PCR, Nasal     Status: None   Collection Time: 03/21/21  1:19 AM   Specimen: Nasal Mucosa; Nasal Swab  Result Value Ref Range Status   MRSA by PCR Next Gen NOT DETECTED NOT DETECTED Final    Comment: (NOTE) The GeneXpert MRSA Assay (FDA approved for NASAL specimens only), is one component of a comprehensive MRSA colonization surveillance program. It is not intended to diagnose MRSA infection nor to guide or monitor treatment for MRSA infections. Test performance is not FDA approved in patients less than 52 years old. Performed at [facility], [address] [place], [place] [address]     Time coordinating discharge: Over 30 minutes  Richarda Osmond, MD  [facility] 03/26/2021, 1:13 PM Pager   If 7PM-7AM, please contact night-coverage [email or web address] Password [email or web address]

## 2021-03-25 LAB — CULTURE, BLOOD (ROUTINE X 2)
Culture: NO GROWTH
Special Requests: ADEQUATE

## 2021-03-26 LAB — CULTURE, BLOOD (ROUTINE X 2)
Culture: NO GROWTH
Special Requests: ADEQUATE

## 2021-03-29 ENCOUNTER — Other Ambulatory Visit: Payer: Self-pay

## 2021-03-29 MED ORDER — INSULIN ASPART 100 UNIT/ML FLEXPEN: PEN_INJECTOR | SUBCUTANEOUS | 1 refills | Status: DC

## 2021-03-31 ENCOUNTER — Encounter (HOSPITAL_COMMUNITY): Payer: Self-pay

## 2021-03-31 ENCOUNTER — Other Ambulatory Visit: Payer: Self-pay

## 2021-03-31 ENCOUNTER — Emergency Department (HOSPITAL_COMMUNITY): Payer: Medicaid Other

## 2021-03-31 ENCOUNTER — Inpatient Hospital Stay (HOSPITAL_COMMUNITY)
Admission: EM | Admit: 2021-03-31 | Discharge: 2021-04-05 | DRG: 871 | Disposition: A | Discharge status: Home Health | Payer: Medicaid Other | Attending: Internal Medicine | Admitting: Internal Medicine

## 2021-03-31 ENCOUNTER — Inpatient Hospital Stay (HOSPITAL_COMMUNITY): Payer: Medicaid Other

## 2021-03-31 DIAGNOSIS — N179 Acute kidney failure, unspecified: Secondary | ICD-10-CM | POA: Diagnosis present

## 2021-03-31 DIAGNOSIS — E860 Dehydration: Secondary | ICD-10-CM | POA: Diagnosis present

## 2021-03-31 DIAGNOSIS — E1110 Type 2 diabetes mellitus with ketoacidosis without coma: Secondary | ICD-10-CM | POA: Diagnosis present

## 2021-03-31 DIAGNOSIS — E876 Hypokalemia: Secondary | ICD-10-CM | POA: Diagnosis not present

## 2021-03-31 DIAGNOSIS — Z91199 Patient's noncompliance with other medical treatment and regimen due to unspecified reason: Secondary | ICD-10-CM

## 2021-03-31 DIAGNOSIS — J189 Pneumonia, unspecified organism: Secondary | ICD-10-CM

## 2021-03-31 DIAGNOSIS — Z6824 Body mass index (BMI) 24.0-24.9, adult: Secondary | ICD-10-CM

## 2021-03-31 DIAGNOSIS — R079 Chest pain, unspecified: Secondary | ICD-10-CM | POA: Diagnosis not present

## 2021-03-31 DIAGNOSIS — Z20822 Contact with and (suspected) exposure to covid-19: Secondary | ICD-10-CM | POA: Diagnosis present

## 2021-03-31 DIAGNOSIS — E871 Hypo-osmolality and hyponatremia: Secondary | ICD-10-CM | POA: Diagnosis present

## 2021-03-31 DIAGNOSIS — G9341 Metabolic encephalopathy: Secondary | ICD-10-CM | POA: Diagnosis present

## 2021-03-31 DIAGNOSIS — Z9641 Presence of insulin pump (external) (internal): Secondary | ICD-10-CM | POA: Diagnosis present

## 2021-03-31 DIAGNOSIS — Z79899 Other long term (current) drug therapy: Secondary | ICD-10-CM

## 2021-03-31 DIAGNOSIS — T383X5A Adverse effect of insulin and oral hypoglycemic [antidiabetic] drugs, initial encounter: Secondary | ICD-10-CM | POA: Diagnosis present

## 2021-03-31 DIAGNOSIS — K2100 Gastro-esophageal reflux disease with esophagitis, without bleeding: Secondary | ICD-10-CM | POA: Diagnosis present

## 2021-03-31 DIAGNOSIS — A0472 Enterocolitis due to Clostridium difficile, not specified as recurrent: Secondary | ICD-10-CM | POA: Diagnosis present

## 2021-03-31 DIAGNOSIS — E875 Hyperkalemia: Secondary | ICD-10-CM | POA: Diagnosis present

## 2021-03-31 DIAGNOSIS — Z8616 Personal history of COVID-19: Secondary | ICD-10-CM | POA: Diagnosis not present

## 2021-03-31 DIAGNOSIS — Y92239 Unspecified place in hospital as the place of occurrence of the external cause: Secondary | ICD-10-CM | POA: Diagnosis present

## 2021-03-31 DIAGNOSIS — Y95 Nosocomial condition: Secondary | ICD-10-CM

## 2021-03-31 DIAGNOSIS — G9340 Encephalopathy, unspecified: Secondary | ICD-10-CM

## 2021-03-31 DIAGNOSIS — T383X6A Underdosing of insulin and oral hypoglycemic [antidiabetic] drugs, initial encounter: Secondary | ICD-10-CM | POA: Diagnosis present

## 2021-03-31 DIAGNOSIS — Z881 Allergy status to other antibiotic agents status: Secondary | ICD-10-CM

## 2021-03-31 DIAGNOSIS — A414 Sepsis due to anaerobes: Principal | ICD-10-CM | POA: Diagnosis present

## 2021-03-31 DIAGNOSIS — N1832 Chronic kidney disease, stage 3b: Secondary | ICD-10-CM | POA: Diagnosis present

## 2021-03-31 DIAGNOSIS — E1036 Type 1 diabetes mellitus with diabetic cataract: Secondary | ICD-10-CM | POA: Diagnosis present

## 2021-03-31 DIAGNOSIS — Z825 Family history of asthma and other chronic lower respiratory diseases: Secondary | ICD-10-CM | POA: Diagnosis not present

## 2021-03-31 DIAGNOSIS — E1010 Type 1 diabetes mellitus with ketoacidosis without coma: Secondary | ICD-10-CM | POA: Diagnosis present

## 2021-03-31 DIAGNOSIS — Z794 Long term (current) use of insulin: Secondary | ICD-10-CM | POA: Diagnosis not present

## 2021-03-31 DIAGNOSIS — A419 Sepsis, unspecified organism: Secondary | ICD-10-CM

## 2021-03-31 DIAGNOSIS — E785 Hyperlipidemia, unspecified: Secondary | ICD-10-CM | POA: Diagnosis present

## 2021-03-31 DIAGNOSIS — R6520 Severe sepsis without septic shock: Secondary | ICD-10-CM | POA: Diagnosis present

## 2021-03-31 DIAGNOSIS — E1311 Other specified diabetes mellitus with ketoacidosis with coma: Secondary | ICD-10-CM

## 2021-03-31 DIAGNOSIS — E872 Acidosis, unspecified: Secondary | ICD-10-CM | POA: Diagnosis not present

## 2021-03-31 DIAGNOSIS — Z833 Family history of diabetes mellitus: Secondary | ICD-10-CM | POA: Diagnosis not present

## 2021-03-31 DIAGNOSIS — E1022 Type 1 diabetes mellitus with diabetic chronic kidney disease: Secondary | ICD-10-CM | POA: Diagnosis present

## 2021-03-31 DIAGNOSIS — E43 Unspecified severe protein-calorie malnutrition: Secondary | ICD-10-CM | POA: Diagnosis present

## 2021-03-31 DIAGNOSIS — R5381 Other malaise: Secondary | ICD-10-CM | POA: Diagnosis present

## 2021-03-31 DIAGNOSIS — E10649 Type 1 diabetes mellitus with hypoglycemia without coma: Secondary | ICD-10-CM | POA: Diagnosis present

## 2021-03-31 DIAGNOSIS — R1310 Dysphagia, unspecified: Secondary | ICD-10-CM | POA: Diagnosis not present

## 2021-03-31 DIAGNOSIS — Z888 Allergy status to other drugs, medicaments and biological substances status: Secondary | ICD-10-CM

## 2021-03-31 DIAGNOSIS — Z597 Insufficient social insurance and welfare support: Secondary | ICD-10-CM

## 2021-03-31 DIAGNOSIS — I428 Other cardiomyopathies: Secondary | ICD-10-CM | POA: Diagnosis not present

## 2021-03-31 DIAGNOSIS — A498 Other bacterial infections of unspecified site: Secondary | ICD-10-CM | POA: Diagnosis not present

## 2021-03-31 DIAGNOSIS — Z9114 Patient's other noncompliance with medication regimen: Secondary | ICD-10-CM

## 2021-03-31 DIAGNOSIS — D631 Anemia in chronic kidney disease: Secondary | ICD-10-CM | POA: Diagnosis present

## 2021-03-31 DIAGNOSIS — Z880 Allergy status to penicillin: Secondary | ICD-10-CM | POA: Diagnosis not present

## 2021-03-31 DIAGNOSIS — R0600 Dyspnea, unspecified: Secondary | ICD-10-CM

## 2021-03-31 LAB — CBC
HCT: 29.5 % — ABNORMAL LOW (ref 36.0–46.0)
Hemoglobin: 7.8 g/dL — ABNORMAL LOW (ref 12.0–15.0)
MCH: 26.5 pg (ref 26.0–34.0)
MCHC: 26.4 g/dL — ABNORMAL LOW (ref 30.0–36.0)
MCV: 100.3 fL — ABNORMAL HIGH (ref 80.0–100.0)
Platelets: 459 10*3/uL — ABNORMAL HIGH (ref 150–400)
RBC: 2.94 MIL/uL — ABNORMAL LOW (ref 3.87–5.11)
RDW: 14.8 % (ref 11.5–15.5)
WBC: 57.8 10*3/uL (ref 4.0–10.5)
nRBC: 0 % (ref 0.0–0.2)

## 2021-03-31 LAB — BLOOD GAS, VENOUS
Acid-base deficit: 28.1 mmol/L — ABNORMAL HIGH (ref 0.0–2.0)
Bicarbonate: 2.7 mmol/L — ABNORMAL LOW (ref 20.0–28.0)
O2 Saturation: 66.9 %
Patient temperature: 98.6
pCO2, Ven: <19 mmHg — CL (ref 44.0–60.0)
pH, Ven: 6.917 — CL (ref 7.250–7.430)
pO2, Ven: 52.2 mmHg — ABNORMAL HIGH (ref 32.0–45.0)

## 2021-03-31 LAB — URINALYSIS, ROUTINE W REFLEX MICROSCOPIC
Bilirubin Urine: NEGATIVE
Glucose, UA: >=500 mg/dL — AB
Ketones, ur: 20 mg/dL — AB
Leukocytes,Ua: NEGATIVE
Nitrite: NEGATIVE
Protein, ur: 100 mg/dL — AB
Specific Gravity, Urine: 1.018 (ref 1.005–1.030)
pH: 5 (ref 5.0–8.0)

## 2021-03-31 LAB — BASIC METABOLIC PANEL
Anion gap: 19 — ABNORMAL HIGH (ref 5–15)
BUN: 40 mg/dL — ABNORMAL HIGH (ref 6–20)
BUN: 39 mg/dL — ABNORMAL HIGH (ref 6–20)
BUN: 31 mg/dL — ABNORMAL HIGH (ref 6–20)
BUN: 35 mg/dL — ABNORMAL HIGH (ref 6–20)
CO2: <7 mmol/L — ABNORMAL LOW (ref 22–32)
CO2: <7 mmol/L — ABNORMAL LOW (ref 22–32)
CO2: <7 mmol/L — ABNORMAL LOW (ref 22–32)
CO2: 7 mmol/L — ABNORMAL LOW (ref 22–32)
Calcium: 7.8 mg/dL — ABNORMAL LOW (ref 8.9–10.3)
Calcium: 7 mg/dL — ABNORMAL LOW (ref 8.9–10.3)
Calcium: 7 mg/dL — ABNORMAL LOW (ref 8.9–10.3)
Calcium: 7.1 mg/dL — ABNORMAL LOW (ref 8.9–10.3)
Chloride: 83 mmol/L — ABNORMAL LOW (ref 98–111)
Chloride: 89 mmol/L — ABNORMAL LOW (ref 98–111)
Chloride: 90 mmol/L — ABNORMAL LOW (ref 98–111)
Chloride: 98 mmol/L (ref 98–111)
Creatinine, Ser: 2.97 mg/dL — ABNORMAL HIGH (ref 0.44–1.00)
Creatinine, Ser: 2.7 mg/dL — ABNORMAL HIGH (ref 0.44–1.00)
Creatinine, Ser: 2.62 mg/dL — ABNORMAL HIGH (ref 0.44–1.00)
Creatinine, Ser: 2.62 mg/dL — ABNORMAL HIGH (ref 0.44–1.00)
GFR, Estimated: 21 mL/min — ABNORMAL LOW (ref >60)
GFR, Estimated: 24 mL/min — ABNORMAL LOW (ref >60)
GFR, Estimated: 25 mL/min — ABNORMAL LOW (ref >60)
GFR, Estimated: 25 mL/min — ABNORMAL LOW (ref >60)
Glucose, Bld: 1497 mg/dL (ref 70–99)
Glucose, Bld: 1330 mg/dL (ref 70–99)
Glucose, Bld: 1219 mg/dL (ref 70–99)
Glucose, Bld: 933 mg/dL (ref 70–99)
Potassium: 5.9 mmol/L — ABNORMAL HIGH (ref 3.5–5.1)
Potassium: 5.4 mmol/L — ABNORMAL HIGH (ref 3.5–5.1)
Potassium: 4.1 mmol/L (ref 3.5–5.1)
Potassium: 3.6 mmol/L (ref 3.5–5.1)
Sodium: 114 mmol/L — CL (ref 135–145)
Sodium: 116 mmol/L — CL (ref 135–145)
Sodium: 120 mmol/L — ABNORMAL LOW (ref 135–145)
Sodium: 124 mmol/L — ABNORMAL LOW (ref 135–145)

## 2021-03-31 LAB — LIPASE, BLOOD: Lipase: 25 U/L (ref 11–51)

## 2021-03-31 LAB — RAPID URINE DRUG SCREEN, HOSP PERFORMED
Amphetamines: NOT DETECTED
Barbiturates: NOT DETECTED
Benzodiazepines: NOT DETECTED
Cocaine: NOT DETECTED
Opiates: NOT DETECTED
Tetrahydrocannabinol: NOT DETECTED

## 2021-03-31 LAB — HEPATIC FUNCTION PANEL
ALT: 19 U/L (ref 0–44)
AST: 20 U/L (ref 15–41)
Albumin: 2.3 g/dL — ABNORMAL LOW (ref 3.5–5.0)
Alkaline Phosphatase: 169 U/L — ABNORMAL HIGH (ref 38–126)
Bilirubin, Direct: 0.1 mg/dL (ref 0.0–0.2)
Indirect Bilirubin: 1.2 mg/dL — ABNORMAL HIGH (ref 0.3–0.9)
Total Bilirubin: 1.3 mg/dL — ABNORMAL HIGH (ref 0.3–1.2)
Total Protein: 5.5 g/dL — ABNORMAL LOW (ref 6.5–8.1)

## 2021-03-31 LAB — GLUCOSE, CAPILLARY
Glucose-Capillary: >600 mg/dL (ref 70–99)
Glucose-Capillary: >600 mg/dL (ref 70–99)
Glucose-Capillary: >600 mg/dL (ref 70–99)
Glucose-Capillary: >600 mg/dL (ref 70–99)
Glucose-Capillary: >600 mg/dL (ref 70–99)
Glucose-Capillary: >600 mg/dL (ref 70–99)
Glucose-Capillary: >600 mg/dL (ref 70–99)
Glucose-Capillary: >600 mg/dL (ref 70–99)
Glucose-Capillary: >600 mg/dL (ref 70–99)
Glucose-Capillary: >600 mg/dL (ref 70–99)
Glucose-Capillary: >600 mg/dL (ref 70–99)
Glucose-Capillary: >600 mg/dL (ref 70–99)
Glucose-Capillary: >600 mg/dL (ref 70–99)
Glucose-Capillary: >600 mg/dL (ref 70–99)
Glucose-Capillary: >600 mg/dL (ref 70–99)
Glucose-Capillary: >600 mg/dL (ref 70–99)
Glucose-Capillary: >600 mg/dL (ref 70–99)

## 2021-03-31 LAB — BLOOD GAS, ARTERIAL
Acid-base deficit: 21.4 mmol/L — ABNORMAL HIGH (ref 0.0–2.0)
Bicarbonate: 6.2 mmol/L — ABNORMAL LOW (ref 20.0–28.0)
O2 Saturation: 95.2 %
Patient temperature: 98.6
pCO2 arterial: 18.3 mmHg — CL (ref 32.0–48.0)
pH, Arterial: 7.157 — CL (ref 7.350–7.450)
pO2, Arterial: 91.3 mmHg (ref 83.0–108.0)

## 2021-03-31 LAB — CBG MONITORING, ED
Glucose-Capillary: >600 mg/dL (ref 70–99)
Glucose-Capillary: >600 mg/dL (ref 70–99)
Glucose-Capillary: >600 mg/dL (ref 70–99)
Glucose-Capillary: >600 mg/dL (ref 70–99)
Glucose-Capillary: >600 mg/dL (ref 70–99)

## 2021-03-31 LAB — BETA-HYDROXYBUTYRIC ACID
Beta-Hydroxybutyric Acid: >8 mmol/L — ABNORMAL HIGH (ref 0.05–0.27)
Beta-Hydroxybutyric Acid: >8 mmol/L — ABNORMAL HIGH (ref 0.05–0.27)
Beta-Hydroxybutyric Acid: 3.78 mmol/L — ABNORMAL HIGH (ref 0.05–0.27)

## 2021-03-31 LAB — LACTIC ACID, PLASMA
Lactic Acid, Venous: 5.5 mmol/L (ref 0.5–1.9)
Lactic Acid, Venous: 5.2 mmol/L (ref 0.5–1.9)

## 2021-03-31 LAB — C DIFFICILE QUICK SCREEN W PCR REFLEX
C Diff antigen: POSITIVE — AB
C Diff toxin: NEGATIVE

## 2021-03-31 LAB — PROCALCITONIN: Procalcitonin: 2.92 ng/mL

## 2021-03-31 LAB — HCG, QUANTITATIVE, PREGNANCY: hCG, Beta Chain, Quant, S: 2 m[IU]/mL (ref <5)

## 2021-03-31 LAB — I-STAT BETA HCG BLOOD, ED (MC, WL, AP ONLY): I-stat hCG, quantitative: 11.8 m[IU]/mL — ABNORMAL HIGH (ref <5)

## 2021-03-31 LAB — RESP PANEL BY RT-PCR (FLU A&B, COVID) ARPGX2
Influenza A by PCR: NEGATIVE
Influenza B by PCR: NEGATIVE
SARS Coronavirus 2 by RT PCR: NEGATIVE

## 2021-03-31 LAB — APTT: aPTT: 28 seconds (ref 24–36)

## 2021-03-31 LAB — PROTIME-INR
INR: 1.6 — ABNORMAL HIGH (ref 0.8–1.2)
Prothrombin Time: 19 seconds — ABNORMAL HIGH (ref 11.4–15.2)

## 2021-03-31 LAB — AMYLASE: Amylase: 11 U/L — ABNORMAL LOW (ref 28–100)

## 2021-03-31 LAB — TSH: TSH: 3.642 u[IU]/mL (ref 0.350–4.500)

## 2021-03-31 MED ORDER — INSULIN REGULAR(HUMAN) IN NACL 100-0.9 UT/100ML-% IV SOLN: INTRAVENOUS | Status: DC

## 2021-03-31 MED ORDER — SODIUM CHLORIDE 0.9 % IV SOLN
1500.0000 mg | Freq: Once | INTRAVENOUS | Status: DC
Start: 2021-03-31 — End: 2021-03-31

## 2021-03-31 MED ORDER — POLYETHYLENE GLYCOL 3350 17 G PO PACK: 17.0000 g | PACK | Freq: Every day | ORAL | Status: DC | PRN

## 2021-03-31 MED ORDER — VANCOMYCIN HCL 125 MG PO CAPS: 125.0000 mg | ORAL_CAPSULE | Freq: Four times a day (QID) | ORAL | Status: DC

## 2021-03-31 MED ORDER — DEXTROSE IN LACTATED RINGERS 5 % IV SOLN: INTRAVENOUS | Status: DC

## 2021-03-31 MED ORDER — INSULIN REGULAR(HUMAN) IN NACL 100-0.9 UT/100ML-% IV SOLN
INTRAVENOUS | Status: DC
  Administered 2021-03-31: 6 [IU]/h via INTRAVENOUS
  Filled 2021-03-31: qty 100

## 2021-03-31 MED ORDER — VANCOMYCIN HCL IN DEXTROSE 1-5 GM/200ML-% IV SOLN
1000.0000 mg | Freq: Once | INTRAVENOUS | Status: AC
  Administered 2021-03-31: 1000 mg via INTRAVENOUS
  Filled 2021-03-31: qty 200

## 2021-03-31 MED ORDER — LACTATED RINGERS IV BOLUS
2000.0000 mL | Freq: Once | INTRAVENOUS | Status: AC
  Administered 2021-03-31: 2000 mL via INTRAVENOUS

## 2021-03-31 MED ORDER — CHLORHEXIDINE GLUCONATE 0.12 % MT SOLN
15.0000 mL | Freq: Two times a day (BID) | OROMUCOSAL | Status: DC
  Administered 2021-03-31: 15 mL via OROMUCOSAL
  Filled 2021-03-31 (×2): qty 15

## 2021-03-31 MED ORDER — SODIUM CHLORIDE 0.9 % IV BOLUS
2000.0000 mL | Freq: Once | INTRAVENOUS | Status: AC
  Administered 2021-03-31: 2000 mL via INTRAVENOUS

## 2021-03-31 MED ORDER — VANCOMYCIN HCL 1.25 G IV SOLR: 1250.0000 mg | INTRAVENOUS | Status: DC

## 2021-03-31 MED ORDER — DOCUSATE SODIUM 100 MG PO CAPS: 100.0000 mg | ORAL_CAPSULE | Freq: Two times a day (BID) | ORAL | Status: DC | PRN

## 2021-03-31 MED ORDER — LACTATED RINGERS IV SOLN: INTRAVENOUS | Status: DC

## 2021-03-31 MED ORDER — DEXTROSE 50 % IV SOLN: 0.0000 mL | INTRAVENOUS | Status: DC | PRN

## 2021-03-31 MED ORDER — LACTATED RINGERS IV BOLUS
1000.0000 mL | Freq: Once | INTRAVENOUS | Status: AC
  Administered 2021-03-31: 1000 mL via INTRAVENOUS

## 2021-03-31 MED ORDER — SODIUM CHLORIDE 0.9 % IV SOLN
2.0000 g | INTRAVENOUS | Status: DC
  Filled 2021-03-31 (×2): qty 2

## 2021-03-31 MED ORDER — SODIUM CHLORIDE 0.9 % IV BOLUS: 1000.0000 mL | Freq: Once | INTRAVENOUS | Status: DC

## 2021-03-31 MED ORDER — SODIUM CHLORIDE 0.9 % IV SOLN: INTRAVENOUS | Status: DC | PRN

## 2021-03-31 MED ORDER — VANCOMYCIN HCL 500 MG/100ML IV SOLN
500.0000 mg | Freq: Once | INTRAVENOUS | Status: AC
  Administered 2021-03-31: 500 mg via INTRAVENOUS
  Filled 2021-03-31: qty 100

## 2021-03-31 MED ORDER — VANCOMYCIN HCL 125 MG PO CAPS
125.0000 mg | ORAL_CAPSULE | Freq: Four times a day (QID) | ORAL | Status: DC
  Administered 2021-03-31 – 2021-04-05 (×14): 125 mg via ORAL
  Filled 2021-03-31 (×20): qty 1

## 2021-03-31 MED ORDER — INSULIN REGULAR(HUMAN) IN NACL 100-0.9 UT/100ML-% IV SOLN
INTRAVENOUS | Status: DC
  Administered 2021-03-31 – 2021-04-01 (×2): 6 [IU]/h via INTRAVENOUS
  Filled 2021-03-31: qty 100

## 2021-03-31 MED ORDER — HEPARIN SODIUM (PORCINE) 5000 UNIT/ML IJ SOLN
5000.0000 [IU] | Freq: Three times a day (TID) | INTRAMUSCULAR | Status: DC
  Administered 2021-03-31 – 2021-04-04 (×12): 5000 [IU] via SUBCUTANEOUS
  Filled 2021-03-31 (×14): qty 1

## 2021-03-31 MED ORDER — ORAL CARE MOUTH RINSE: 15.0000 mL | Freq: Two times a day (BID) | OROMUCOSAL | Status: DC

## 2021-03-31 MED ORDER — ONDANSETRON HCL 4 MG/2ML IJ SOLN
4.0000 mg | Freq: Four times a day (QID) | INTRAMUSCULAR | Status: DC | PRN
  Administered 2021-04-01: 4 mg via INTRAVENOUS
  Filled 2021-03-31: qty 2

## 2021-03-31 MED ORDER — VANCOMYCIN HCL 500 MG IV SOLR
500.0000 mg | Freq: Once | INTRAVENOUS | Status: DC
  Filled 2021-03-31: qty 10

## 2021-03-31 MED ORDER — SODIUM CHLORIDE 0.9 % IV SOLN
2.0000 g | Freq: Once | INTRAVENOUS | Status: AC
  Administered 2021-03-31: 2 g via INTRAVENOUS
  Filled 2021-03-31 (×2): qty 2

## 2021-03-31 MED ORDER — CHLORHEXIDINE GLUCONATE CLOTH 2 % EX PADS
6.0000 | MEDICATED_PAD | Freq: Every day | CUTANEOUS | Status: DC
  Administered 2021-04-01 – 2021-04-04 (×3): 6 via TOPICAL

## 2021-03-31 NOTE — Progress Notes (Signed)
Pharmacy Antibiotic Note  Ashley Freeman is a 30 y.o. female admitted on 03/31/2021 with sepsis.  Pharmacy has been consulted for vancomycin & cefepime dosing. She was given cefepime 2 gm in ED  @ 1109 am & vancomycin 1 gm @ 1113 am.  WBC 57.8, SCr 2.7, Lactate 5.3, hypothermic. Temp 94.5 Istat Pregnancy test 11.8 HCG 2 > not pregnant per Noe Gens   Plan: Vancomycin 1 gm given in ED @ 1113 am, give vancomycin 500 mg now to make total loading dose = 1500 mg, then vancomycin 1250 mg IV q48 for est AUC 451.3 using wt 62.4 kg, SCr 2.7, Vd 0.72 Cefepime 2 gm IV q24 F/u renal function, WBC, temp, culture data Vancomycin levels as needed  Height: 5\' 3"  (160 cm) Weight: 62.4 kg (137 lb 9.1 oz) IBW/kg (Calculated) : 52.4  Temp (24hrs), Avg:93.7 F (34.3 C), Min:93.2 F (34 C), Max:94.5 F (34.7 C)  Recent Labs  Lab 03/31/21 1018 03/31/21 1050 03/31/21 1218 03/31/21 1239  WBC 57.8*  --   --   --   CREATININE 2.97*  --  2.70*  --   LATICACIDVEN  --  5.5*  --  5.2*    Estimated Creatinine Clearance: 25.7 mL/min (A) (by C-G formula based on SCr of 2.7 mg/dL (H)).    Allergies  Allergen Reactions   Cephalexin Anaphylaxis   Penicillins Hives and Rash    Has patient had a PCN reaction causing immediate rash, facial/tongue/throat swelling, SOB or lightheadedness with hypotension: Yes Has patient had a PCN reaction causing severe rash involving mucus membranes or skin necrosis: No Has patient had a PCN reaction that required hospitalization: Yes Has patient had a PCN reaction occurring within the last 10 years: No Spoke with pt - childhood hives told by mom, tried no pcns since, doesn't remember reaction herself    Benadryl [Diphenhydramine] Itching   Doxycycline Itching    Antimicrobials this admission: 12/2 cefepime> 12/2 vancomycin>> Dose adjustments this admission:  Microbiology results: 12/2 UCx: sent 12/2 BCx2: sent 12/2 MRSA: ordered  Thank you for  allowing pharmacy to be a part of this patient's care.  Eudelia Bunch, Pharm.D 03/31/2021 3:28 PM

## 2021-03-31 NOTE — Sepsis Progress Note (Signed)
eLink is monitoring this Code Sepsis. °

## 2021-03-31 NOTE — ED Notes (Signed)
Once again, no change to inuslin drip per endotool

## 2021-03-31 NOTE — ED Notes (Signed)
CRITICAL VALUE STICKER  CRITICAL VALUE:BG [phone]  RECEIVER (on-site recipient of call): I.Fern Canova  DATE & TIME NOTIFIED: 03/31/21 1118  MESSENGER (representative from lab):Lab  MD NOTIFIED: [place]  RESPONSE:

## 2021-03-31 NOTE — ED Notes (Signed)
Bair hugger applied.

## 2021-03-31 NOTE — ED Notes (Signed)
No changes made to insulin drip per endotool

## 2021-03-31 NOTE — Progress Notes (Signed)
RN reports patient c/wo dyspnea  Pulse ox 100% on RA  CXR - looks clear  Plan  - check ABG    SIGNATURE    Dr. Brand Males, M.D., F.C.C.P,  Pulmonary and Critical Care Medicine Staff Physician, [place] Director - Interstitial Lung Disease  Program  Pulmonary [place] at [place], [place], [address]  NPI Number:  NPI #[id number]  Pager: [phone], If no answer  -> Check AMION or Try [phone] Telephone (clinical office): [phone] Telephone (research): [phone]  6:42 PM 03/31/2021

## 2021-03-31 NOTE — ED Provider Notes (Signed)
[facility] DEPT Provider Note   CSN: [id number] Arrival date & time: 03/31/21  7048     History Chief Complaint  Patient presents with   Hyperglycemia   Facial Swelling    Ashley Freeman is a 30 y.o. female with hx of Type 1 DM, CKD recently hospitalized and COVID positive and DKA about one weeks ago. Patient's mother states that she came downstairs today, patient was complaining of feeling weak.  Eventually she became very somnolent mother brought her to the ED.  Mother is unaware of patient's compliance with her diabetes medication.  Rest of exam limited due to acuity of patient condition.   Hyperglycemia     Past Medical History:  Diagnosis Date   Abscess, gluteal, right 08/24/2013   AKI (acute kidney injury) ([place]) 07/26/2014   Anemia 02/19/2012   Bartholin's gland abscess 09/19/2013   BV (bacterial vaginosis) 11/24/2015   Diabetes mellitus type I ([place]) 2001   Diagnosed at age 37 ; Type I   Diarrhea 05/30/2016   DKA (diabetic ketoacidoses) 08/19/2013   Also in 2018   Gonorrhea 08/2011   Treated in 09/2011   History of trichomoniasis 05/31/2016   Hyperlipidemia 03/28/2016   Sepsis ([place]) 09/19/2013    Patient Active Problem List   Diagnosis Date Noted   COVID-19    Diabetic ketoacidosis without coma associated with type 1 diabetes mellitus ([place])    Hyperbilirubinemia 03/07/2021   Mild protein malnutrition ([place]) 03/07/2021   DKA, type 1 ([place]) 12/08/2020   AKI (acute kidney injury) ([place]) 09/17/2020   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 ([place]) 09/17/2020   Type 1 diabetes mellitus with stage 3a chronic kidney disease ([place]) 07/18/2020   History of pre-eclampsia 10/05/2019   Transaminitis 10/05/2019   Elevated serum creatinine 10/05/2019   Bilateral leg edema 88/91/6945   Systolic ejection murmur 03/88/8280   Shortness of breath 08/03/2019   Type 1 diabetes mellitus with hyperglycemia ([place]) 07/24/2019   Diabetic  retinopathy ([place]) 07/02/2019   Proteinuria due to type 1 diabetes mellitus ([place]) 05/21/2019   Diabetic neuropathy ([place]) 01/16/2016   Leukocytosis 02/19/2012   Normocytic anemia 02/19/2012   Diabetes mellitus type 1, uncontrolled, with complications 03/49/1791    Past Surgical History:  Procedure Laterality Date   CESAREAN SECTION N/A 10/05/2019   Procedure: CESAREAN SECTION;  Surgeon: Aletha Halim, MD;  Location: [facility];  Service: Obstetrics;  Laterality: N/A;   INCISION AND DRAINAGE ABSCESS Left 09/28/2019   Procedure: INCISION AND DRAINAGE VULVAR ABCESS;  Surgeon: [name], MD;  Location: [facility];  Service: Gynecology;  Laterality: Left;   INCISION AND DRAINAGE PERIRECTAL ABSCESS Right 08/18/2013   Procedure: IRRIGATION AND DEBRIDEMENT GLUTEAL ABSCESS;  Surgeon: Ralene Ok, MD;  Location: [facility];  Service: General;  Laterality: Right;   INCISION AND DRAINAGE PERIRECTAL ABSCESS Right 09/19/2013   Procedure: IRRIGATION AND DEBRIDEMENT RIGHT GLUTEAL AND LABIAL ABSCESSES;  Surgeon: Ralene Ok, MD;  Location: [facility];  Service: General;  Laterality: Right;   INCISION AND DRAINAGE PERIRECTAL ABSCESS Right 09/24/2013   Procedure: IRRIGATION AND DEBRIDEMENT PERIRECTAL ABSCESS;  Surgeon: Gwenyth Ober, MD;  Location: [facility];  Service: General;  Laterality: Right;     OB History     Gravida  2   Para  1   Term      Preterm  1   AB  1   Living  1      SAB  1  IAB      Ectopic      Multiple  0   Live Births  1           Family History  Problem Relation Age of Onset   Asthma Mother    Carpal tunnel syndrome Mother    Gout Father    Diabetes Paternal Grandmother    Anesthesia problems Neg Hx     Social History   Tobacco Use   Smoking status: Never   Smokeless tobacco: Never  Vaping Use   Vaping Use: Never used  Substance Use Topics   Alcohol use: Not Currently   Drug use: No    Home Medications Prior to Admission medications   Medication  Sig Start Date End Date Taking? Authorizing Provider  albuterol (VENTOLIN HFA) 108 (90 Base) MCG/ACT inhaler Inhale 2 puffs into the lungs every 4 (four) hours as needed for wheezing or shortness of breath. 08/09/20   [provider]  amLODipine (NORVASC) 10 MG tablet Take 1 tablet (10 mg total) by mouth daily. 03/24/21 04/23/21  Richarda Osmond, MD  blood glucose meter kit and supplies KIT Dispense based on patient and insurance preference. Use up to four times daily as directed. (FOR ICD-9 250.00, 250.01). 05/25/17   Isla Pence, MD  Blood Pressure Monitoring (BLOOD PRESSURE KIT) DEVI 1 Device by Does not apply route as needed. 05/06/19   Anyanwu, Sallyanne Havers, MD  Continuous Blood Gluc Sensor (DEXCOM G6 SENSOR) MISC Inject 1 Device into the skin as directed. 12/16/20   Shamleffer, Melanie Crazier, MD  Continuous Blood Gluc Transmit (DEXCOM G6 TRANSMITTER) MISC Inject 1 Device into the skin as directed. Use to check blood sugar daily 12/16/20   Shamleffer, Melanie Crazier, MD  insulin aspart (NOVOLOG) 100 UNIT/ML FlexPen 0-9 Units, Subcutaneous, 3 times daily with meals, CBG < 70: Implement Hypoglycemia measures CBG 70 - 120: 0 units CBG 121 - 150: 1 unit CBG 151 - 200: 2 units CBG 201 - 250: 3 units CBG 251 - 300: 5 units CBG 301 - 350: 7 units CBG 351 - 400: 9 units CBG > 400: call MD 03/29/21   Shamleffer, Melanie Crazier, MD  Insulin Disposable Pump (OMNIPOD 5 G6 INTRO, GEN 5,) KIT 1 Device by Does not apply route every 3 (three) days. Patient taking differently: 1 Device by Does not apply route every 3 (three) days. 100 units for 72 hours 12/16/20   Shamleffer, Melanie Crazier, MD  Insulin Disposable Pump (OMNIPOD 5 G6 POD, GEN 5,) MISC 1 Device by Does not apply route every 3 (three) days. 12/16/20   Shamleffer, Melanie Crazier, MD  Insulin Pen Needle 32G X 4 MM MISC Use as directed 10/07/20   Ghimire, Henreitta Leber, MD  ipratropium (ATROVENT HFA) 17 MCG/ACT inhaler Inhale 1 puff into the lungs  every 6 (six) hours. 03/24/21   Richarda Osmond, MD  lamoTRIgine (LAMICTAL) 100 MG tablet Take 100 mg by mouth daily. 02/28/21   [provider]  Ferrous Sulfate (IRON) 325 (65 Fe) MG TABS Take 1 tablet (325 mg total) by mouth every other day. Patient not taking: Reported on 07/25/2020 06/17/19 07/25/20  Chauncey Mann, MD  hydrochlorothiazide (HYDRODIURIL) 25 MG tablet Take 1 tablet (25 mg total) by mouth daily. Patient not taking: No sig reported 10/08/19 07/25/20  Constant, Peggy, MD  promethazine (PHENERGAN) 25 MG tablet Take 1 tablet (25 mg total) by mouth every 8 (eight) hours as needed for nausea or vomiting (if zofran fails).  Patient not taking: Reported on 10/06/2020 08/25/20 10/06/20  Varney Biles, MD    Allergies    Cephalexin, Penicillins, Benadryl [diphenhydramine], and Doxycycline  Review of Systems   Review of Systems  Unable to perform ROS: Acuity of condition   Physical Exam Updated Vital Signs BP 90/70   Pulse 99   Temp (!) 93.9 F (34.4 C) (Rectal)   Resp (!) 34   Ht _0  (1.6 m)   Wt 64.9 kg   SpO2 100%   BMI 25.33 kg/m   Physical Exam Vitals and nursing note reviewed.  Constitutional:      General: She is in acute distress.     Appearance: She is ill-appearing and toxic-appearing.  HENT:     Head: Atraumatic.     Nose: Nose normal.     Mouth/Throat:     Mouth: Mucous membranes are dry.  Eyes:     Extraocular Movements: Extraocular movements intact.     Conjunctiva/sclera: Conjunctivae normal.  Cardiovascular:     Rate and Rhythm: Regular rhythm. Tachycardia present.     Pulses: Normal pulses.          Radial pulses are 2+ on the right side and 2+ on the left side.       Dorsalis pedis pulses are 2+ on the right side and 2+ on the left side.     Heart sounds: No murmur heard.    Comments: Tachycardic ; heart sounds otherwise normal, 2+ distal pulses bilaterally Pulmonary:     Effort: No accessory muscle usage.     Comments: Increased work  of breathing with Kussmaul respirations;  Abdominal:     General: Abdomen is flat. There is no distension.     Palpations: Abdomen is soft.     Tenderness: There is abdominal tenderness.     Comments: Mild epigastric tenderness  Musculoskeletal:        General: Normal range of motion.     Cervical back: Normal range of motion.  Skin:    General: Skin is warm and dry.     Capillary Refill: Capillary refill takes less than 2 seconds.  Neurological:     General: No focal deficit present.     Motor: Weakness present.  Psychiatric:        Mood and Affect: Mood normal.    ED Results / Procedures / Treatments   Labs (all labs ordered are listed, but only abnormal results are displayed) Labs Reviewed  BASIC METABOLIC PANEL - Abnormal; Notable for the following components:      Result Value   Glucose, Bld 1,497 (*)    All other components within normal limits  CBC - Abnormal; Notable for the following components:   WBC 57.8 (*)    RBC 2.94 (*)    Hemoglobin 7.8 (*)    HCT 29.5 (*)    MCV 100.3 (*)    MCHC 26.4 (*)    Platelets 459 (*)    All other components within normal limits  BLOOD GAS, VENOUS - Abnormal; Notable for the following components:   pH, Ven 6.917 (*)    pCO2, Ven <19.0 (*)    pO2, Ven 52.2 (*)    Bicarbonate 2.7 (*)    Acid-base deficit 28.1 (*)    All other components within normal limits  PROTIME-INR - Abnormal; Notable for the following components:   Prothrombin Time 19.0 (*)    INR 1.6 (*)    All other components within normal limits  CBG  MONITORING, ED - Abnormal; Notable for the following components:   Glucose-Capillary >600 (*)    All other components within normal limits  I-STAT BETA HCG BLOOD, ED ([facility], [facility], [facility] ONLY) - Abnormal; Notable for the following components:   I-stat hCG, quantitative 11.8 (*)    All other components within normal limits  RESP PANEL BY RT-PCR (FLU A&B, COVID) ARPGX2  CULTURE, BLOOD (ROUTINE X 2)  CULTURE, BLOOD (ROUTINE X 2)   URINE CULTURE  APTT  URINALYSIS, ROUTINE W REFLEX MICROSCOPIC  LACTIC ACID, PLASMA  LACTIC ACID, PLASMA  BETA-HYDROXYBUTYRIC ACID  HCG, QUANTITATIVE, PREGNANCY  HEPATIC FUNCTION PANEL  CBG MONITORING, ED  I-STAT CHEM 8, ED    EKG EKG Interpretation  Date/Time:  Friday March 31 2021 10:32:07 EST Ventricular Rate:  104 PR Interval:  122 QRS Duration: 92 QT Interval:  371 QTC Calculation: 488 R Axis:   68 Text Interpretation: Sinus tachycardia Borderline ST depression, diffuse leads Borderline prolonged QT interval Confirmed by Lennice Sites ([id number]) on 03/31/2021 10:39:01 AM  Radiology DG Chest Portable 1 View  Result Date: 03/31/2021 CLINICAL DATA:  Diabetic ketoacidosis EXAM: PORTABLE CHEST 1 VIEW COMPARISON:  Prior chest x-ray 03/20/2021 FINDINGS: Low inspiratory volumes. Streaky opacities in the left lower lobe favored to reflect atelectasis. Cardiac and mediastinal contours are normal. No pleural effusion or pneumothorax. No acute osseous abnormality. IMPRESSION: Streaky airspace opacities in the left lower lobe favored to reflect atelectasis. Early bronchopneumonia is difficult to exclude entirely. Recommend dedicated PA and lateral chest x-ray when the patient is able. Electronically Signed   By: Jacqulynn Cadet M.D.   On: 03/31/2021 11:11    Procedures Procedures   Medications Ordered in ED Medications  vancomycin (VANCOCIN) IVPB 1000 mg/200 mL premix (1,000 mg Intravenous New Bag/Given 03/31/21 1113)  insulin regular, human (MYXREDLIN) 100 units/ 100 mL infusion (has no administration in time range)  lactated ringers infusion (has no administration in time range)  dextrose 5 % in lactated ringers infusion (has no administration in time range)  dextrose 50 % solution 0-50 mL (has no administration in time range)  sodium chloride 0.9 % bolus 2,000 mL (2,000 mLs Intravenous New Bag/Given 03/31/21 1026)  ceFEPIme (MAXIPIME) 2 g in sodium chloride 0.9 % 100 mL IVPB (0 g  Intravenous Stopped 03/31/21 1134)    ED Course  I have reviewed the triage vital signs and the nursing notes.  Pertinent labs & imaging results that were available during my care of the patient were reviewed by me and considered in my medical decision making (see chart for details).    MDM Rules/Calculators/A&P                         Initial impression: 30 year old female with type 1 diabetes with 2 recent hospitalizations for DKA and COVID-positive presents again and possible DKA.  Patient's mother states patient reported feeling weak this morning.  On exam patient's skin is dry, mucous membranes are dry.  Increased work of breathing on exam with Kussmaul respirations.  Patient is tachycardic and tachypneic.  Body temperature 94 F.  Fingerstick glucose above 600.  DKA work-up initiated.  Sepsis protocol indicated.  Workup: BMP with blood glucose of 1497, lactic acid 5.5.  pH on VBG 6.9, UA with mod amounts of blood greater than 500 glucose positive for ketones protein.  CBC with white count 57.  7.8.  UDS negative.  Beta hydroxy uric acid greater than 8.  Chest x-ray indicative  of early bronchopneumonia  Treatment: Sepsis protocol initiated with IV antibiotics.  COVID and flu are negative.  Patient given 2 L bolus of normal saline.  Consult: Dr. Lennice Sites spoke with ICU team who recommend admission to stepdown.  Also recommend additional fluids.  Hospital team recommends ICU admission.  Plan:  Patient to be admitted to ICU team for further treatment and evaluation.  The care and treatment of this patient was decided in conjunction with my attending physician, Dr. Lennice Sites.  All of the lab, imaging, and/or EKG results were discussed with pt and family at bedside. They have expressed understanding of verbal discharge instructions and implications for return. Pt and family had opportunity to ask questions and have no further questions at this time.   All labs and imaging were  independently reviewed and interpreted by myself, Kathe Becton, PA-C  Final Clinical Impression(s) / ED Diagnoses Final diagnoses:  None    Rx / DC Orders ED Discharge Orders     None        Tonye Pearson, PA-C 04/02/21 [place], [place], DO 04/02/21 0840

## 2021-03-31 NOTE — ED Notes (Signed)
Pt had a I-stat chem 8 couldn't report  results due to bad  blood sample informed the nurse.

## 2021-03-31 NOTE — H&P (Addendum)
NAME:  Ashley Freeman, MRN:  [id number], DOB:  December 08, 1990, LOS: 0 ADMISSION DATE:  03/31/2021, CONSULTATION DATE: 03/31/2021 REFERRING MD: Dr. Ronnald Nian, CHIEF COMPLAINT: Elevated glucose  History of Present Illness:  30 year old female who presented to [facility] ER on 12/30 with reports of elevated blood glucose.   She recently was admitted at [facility] from 11/21 through 11/27 with DKA in the setting of COVID-19 and influenza infection.  On presentation 12/2 the patient was found to be hypothermic, tachycardic with soft normal blood pressures.  She was altered and intermittently able to follow some commands.  Her mother reported that she had been noncompliant with home insulin regimen.  Patient reportedly has an insulin pump but has not been using.  Initial VBG 6.9 / <19.  Labs notable for NA 116, K5.4, CL 89, CO2 <7, glucose 1330, BUN 39, creatinine 2.7, lactic acid 5.2, WBC 57.8, hemoglobin 7.8, MCV 100.3, platelets 459 and INR 1.6.  Repeat influenza and COVID screening negative.  Urinalysis notable for glucose greater than 500, 20 ketones, protein 100 and rare bacteria.  Chest x-ray with mild left lower streaky opacities thought to be atelectasis vs early pneumonia.  Patient was treated with IV fluid resuscitation and insulin infusion. There was question of patient starting her menses vs blood from stool in ER.  Episode of diarrhea in ER.   PCCM called for ICU admission.   Pertinent  Medical History  DM I  Significant Hospital Events: Including procedures, antibiotic start and stop dates in addition to other pertinent events   12/2 admit with DKA  Interim History / Subjective:  As above.  Objective   Blood pressure 121/61, pulse (!) 105, temperature (!) 93.7 F (34.3 C), resp. rate (!) 35, height _0  (1.6 m), weight 64.9 kg, SpO2 99 %, unknown if currently breastfeeding.       No intake or output data in the 24 hours ending 03/31/21 1352 Filed Weights   03/31/21 0851   Weight: 64.9 kg    Examination: General: Thin adult female lying on ER stretcher, in no acute distress but ill-appearing HENT: MM pink/dry, anicteric, pupils equal/round Lungs: Kussmaul respirations, nonlabored, lungs bilaterally clear with good air entry Cardiovascular: S1-S2 RRR, ST on monitor, no murmurs, rubs or gallops Abdomen: Soft, nondistended, BS x4 active Extremities: Cool to touch, dry Neuro: Lethargic but awakens to voice, nods yes/no answers appropriately to questions, moving all extremities spontaneously GU: Foley catheter with clear yellow urine, lower abdominal horizontal surgical scar  Resolved Hospital Problem list     Assessment & Plan:   DKA (POA) The setting of medical noncompliance.  Possible contribution of recent critical illness with COVID and influenza infection.  Poorly controlled DM with HgbA1c ranging from 10-14 in recent months. Negative pregnancy test.  -admit to ICU  -IVF and insulin per DKA protocol  -NPO -follow beta hydroxybutyrate  -assess A1c -needs counseling regarding glucose control / importance of compliance  -assess UDS   AKI (POA) Acute Metabolic Acidosis, Lactic Acidosis  In setting of volume depletion, DKA -volume resuscitation as above  -Trend BMP / urinary output -Replace electrolytes as indicated -Avoid nephrotoxic agents, ensure adequate renal perfusion  Leukocytosis / Sepsis  No clear source infection but recently ill with COVID and Influenza requiring admission.  -assess blood cultures, urinalysis  -empiric cefepime + vancomycin in ER, continue for now -follow WBC trend, fever curve  -assess PCT  -monitor for further stool output (one episode of loose stool in ER)  Anemia  Suspect of chronic disease  -anemia panel in am  -follow CBC -transfuse for Hgb <7%  Best Practice (right click and "Reselect all SmartList Selections" daily)  Diet/type: NPO DVT prophylaxis: prophylactic heparin  GI prophylaxis: N/A Lines:  N/A Foley:  Yes, and it is still needed Code Status:  full code Last date of multidisciplinary goals of care discussion - pending.  Mother, Mikki Santee [phone] or [phone]  Labs   CBC: Recent Labs  Lab 03/31/21 1018  WBC 57.8*  HGB 7.8*  HCT 29.5*  MCV 100.3*  PLT 459*    Basic Metabolic Panel: Recent Labs  Lab 03/31/21 1018 03/31/21 1218  NA 114* 116*  K 5.9* 5.4*  CL 83* 89*  CO2 <7* <7*  GLUCOSE 1,497* 1,330*  BUN 40* 39*  CREATININE 2.97* 2.70*  CALCIUM 7.8* 7.0*   GFR: Estimated Creatinine Clearance: 28.1 mL/min (A) (by C-G formula based on SCr of 2.7 mg/dL (H)). Recent Labs  Lab 03/31/21 1018 03/31/21 1050 03/31/21 1239  WBC 57.8*  --   --   LATICACIDVEN  --  5.5* 5.2*    Liver Function Tests: Recent Labs  Lab 03/31/21 1018  AST 20  ALT 19  ALKPHOS 169*  BILITOT 1.3*  PROT 5.5*  ALBUMIN 2.3*   No results for input(s): LIPASE, AMYLASE in the last 168 hours. No results for input(s): AMMONIA in the last 168 hours.  ABG    Component Value Date/Time   PHART 7.22 (L) 03/21/2021 0355   PCO2ART 19 (LL) 03/21/2021 0355   PO2ART 46 (L) 03/21/2021 0355   HCO3 2.7 (L) 03/31/2021 1018   TCO2 13 (L) 10/06/2020 1020   ACIDBASEDEF 28.1 (H) 03/31/2021 1018   O2SAT 66.9 03/31/2021 1018     Coagulation Profile: Recent Labs  Lab 03/31/21 1043  INR 1.6*    Cardiac Enzymes: No results for input(s): CKTOTAL, CKMB, CKMBINDEX, TROPONINI in the last 168 hours.  HbA1C: Hemoglobin A1C  Date/Time Value Ref Range Status  11/11/2013 04:31 AM 14.6 (H) 4.2 - 6.3 % Final    Comment:    The American Diabetes Association recommends that a primary goal of therapy should be <7% and that physicians should reevaluate the treatment regimen in patients with HbA1c values consistently >8%.   09/12/2011 02:53 AM SEE COMMENT 4.2 - 6.3 % Final    Comment:    The American Diabetes Association recommends that a primary goal of therapy should be <7% and  that physicians should reevaluate the treatment regimen in patients with HbA1c values consistently >8%. HGB A1C - Unable to perform testing at [facility] due  - to interfering substance. HGB A1C - H -- 16.8 %  - Reference Range: 4.8-5.6  - ----------------------------------------  - Increased risk for diabetes: 5.7-6.4  - Diabetes: >6.4  - Glycemic control for adults with  - .[name]... diabetes: <7.0  - ----------------------------------------  - PERFORMED BY [employer] SPEC.NO.: [id number]  - [address] [address]  - Lindon Romp, MD  - [phone]    HbA1c, POC (controlled diabetic range)  Date/Time Value Ref Range Status  06/04/2018 09:29 AM   Final    Comment:    >15   Hgb A1c MFr Bld  Date/Time Value Ref Range Status  03/07/2021 04:10 PM 10.5 (H) 4.8 - 5.6 % Final    Comment:    (NOTE)         Prediabetes: 5.7 - 6.4         Diabetes: >6.4  Glycemic control for adults with diabetes: <7.0   12/08/2020 04:17 AM 13.0 (H) 4.8 - 5.6 % Final    Comment:    (NOTE) Pre diabetes:          5.7%-6.4%  Diabetes:              >6.4%  Glycemic control for   <7.0% adults with diabetes     CBG: Recent Labs  Lab 03/31/21 0851 03/31/21 1147 03/31/21 1233 03/31/21 1302 03/31/21 1341  GLUCAP >600* >600* >600* >600* >600*    Review of Systems:   Unable to complete as patient is altered.    Past Medical History:  She,  has a past medical history of Abscess, gluteal, right (08/24/2013), AKI (acute kidney injury) ([place]) (07/26/2014), Anemia (02/19/2012), Bartholin's gland abscess (09/19/2013), BV (bacterial vaginosis) (11/24/2015), Diabetes mellitus type I ([place]) (2001), Diarrhea (05/30/2016), DKA (diabetic ketoacidoses) (08/19/2013), Gonorrhea (08/2011), History of trichomoniasis (05/31/2016), Hyperlipidemia (03/28/2016), and Sepsis ([place]) (09/19/2013).   Surgical History:   Past Surgical History:  Procedure Laterality Date   CESAREAN SECTION N/A  10/05/2019   Procedure: CESAREAN SECTION;  Surgeon: Aletha Halim, MD;  Location: [facility];  Service: Obstetrics;  Laterality: N/A;   INCISION AND DRAINAGE ABSCESS Left 09/28/2019   Procedure: INCISION AND DRAINAGE VULVAR ABCESS;  Surgeon: [name], MD;  Location: [place];  Service: Gynecology;  Laterality: Left;   INCISION AND DRAINAGE PERIRECTAL ABSCESS Right 08/18/2013   Procedure: IRRIGATION AND DEBRIDEMENT GLUTEAL ABSCESS;  Surgeon: Ralene Ok, MD;  Location: [place];  Service: General;  Laterality: Right;   INCISION AND DRAINAGE PERIRECTAL ABSCESS Right 09/19/2013   Procedure: IRRIGATION AND DEBRIDEMENT RIGHT GLUTEAL AND LABIAL ABSCESSES;  Surgeon: Ralene Ok, MD;  Location: [place];  Service: General;  Laterality: Right;   INCISION AND DRAINAGE PERIRECTAL ABSCESS Right 09/24/2013   Procedure: IRRIGATION AND DEBRIDEMENT PERIRECTAL ABSCESS;  Surgeon: Gwenyth Ober, MD;  Location: [place];  Service: General;  Laterality: Right;     Social History:   reports that she has never smoked. She has never used smokeless tobacco. She reports that she does not currently use alcohol. She reports that she does not use drugs.   Family History:  Her family history includes Asthma in her mother; Carpal tunnel syndrome in her mother; Diabetes in her paternal grandmother; Gout in her father. There is no history of Anesthesia problems.   Allergies Allergies  Allergen Reactions   Cephalexin Anaphylaxis   Penicillins Hives and Rash    Has patient had a PCN reaction causing immediate rash, facial/tongue/throat swelling, SOB or lightheadedness with hypotension: Yes Has patient had a PCN reaction causing severe rash involving mucus membranes or skin necrosis: No Has patient had a PCN reaction that required hospitalization: Yes Has patient had a PCN reaction occurring within the last 10 years: No Spoke with pt - childhood hives told by mom, tried no pcns since, doesn't remember reaction herself     Benadryl [Diphenhydramine] Itching   Doxycycline Itching     Home Medications  Prior to Admission medications   Medication Sig Start Date End Date Taking? Authorizing Provider  albuterol (VENTOLIN HFA) 108 (90 Base) MCG/ACT inhaler Inhale 2 puffs into the lungs every 4 (four) hours as needed for wheezing or shortness of breath. 08/09/20   [provider]  amLODipine (NORVASC) 10 MG tablet Take 1 tablet (10 mg total) by mouth daily. 03/24/21 04/23/21  Richarda Osmond, MD  blood glucose meter kit and supplies KIT Dispense  based on patient and insurance preference. Use up to four times daily as directed. (FOR ICD-9 250.00, 250.01). 05/25/17   Isla Pence, MD  Blood Pressure Monitoring (BLOOD PRESSURE KIT) DEVI 1 Device by Does not apply route as needed. 05/06/19   Anyanwu, Sallyanne Havers, MD  Continuous Blood Gluc Sensor (DEXCOM G6 SENSOR) MISC Inject 1 Device into the skin as directed. 12/16/20   Shamleffer, Melanie Crazier, MD  Continuous Blood Gluc Transmit (DEXCOM G6 TRANSMITTER) MISC Inject 1 Device into the skin as directed. Use to check blood sugar daily 12/16/20   Shamleffer, Melanie Crazier, MD  insulin aspart (NOVOLOG) 100 UNIT/ML FlexPen 0-9 Units, Subcutaneous, 3 times daily with meals, CBG < 70: Implement Hypoglycemia measures CBG 70 - 120: 0 units CBG 121 - 150: 1 unit CBG 151 - 200: 2 units CBG 201 - 250: 3 units CBG 251 - 300: 5 units CBG 301 - 350: 7 units CBG 351 - 400: 9 units CBG > 400: call MD 03/29/21   Shamleffer, Melanie Crazier, MD  Insulin Disposable Pump (OMNIPOD 5 G6 INTRO, GEN 5,) KIT 1 Device by Does not apply route every 3 (three) days. Patient taking differently: 1 Device by Does not apply route every 3 (three) days. 100 units for 72 hours 12/16/20   Shamleffer, Melanie Crazier, MD  Insulin Disposable Pump (OMNIPOD 5 G6 POD, GEN 5,) MISC 1 Device by Does not apply route every 3 (three) days. 12/16/20   Shamleffer, Melanie Crazier, MD  Insulin Pen Needle 32G X 4 MM  MISC Use as directed 10/07/20   Ghimire, Henreitta Leber, MD  ipratropium (ATROVENT HFA) 17 MCG/ACT inhaler Inhale 1 puff into the lungs every 6 (six) hours. 03/24/21   Richarda Osmond, MD  lamoTRIgine (LAMICTAL) 100 MG tablet Take 100 mg by mouth daily. 02/28/21   [provider]  Ferrous Sulfate (IRON) 325 (65 Fe) MG TABS Take 1 tablet (325 mg total) by mouth every other day. Patient not taking: Reported on 07/25/2020 06/17/19 07/25/20  Chauncey Mann, MD  hydrochlorothiazide (HYDRODIURIL) 25 MG tablet Take 1 tablet (25 mg total) by mouth daily. Patient not taking: No sig reported 10/08/19 07/25/20  Constant, Peggy, MD  promethazine (PHENERGAN) 25 MG tablet Take 1 tablet (25 mg total) by mouth every 8 (eight) hours as needed for nausea or vomiting (if zofran fails). Patient not taking: Reported on 10/06/2020 08/25/20 10/06/20  Varney Biles, MD     Critical care time: 79 minutes    Noe Gens, MSN, APRN, NP-C, AGACNP-BC  [facility] 03/31/2021, 1:52 PM   Please see [email or web address] for pager details.   From 7A-7P if no response, please call [phone] After hours, please call ELink [phone]

## 2021-03-31 NOTE — ED Triage Notes (Signed)
Per EMS- Patient's CBG was >600. Patient also has facial swelling. Patient denies taking any meds or eating anything today.    Patient refused an oral temp in triage.

## 2021-03-31 NOTE — Progress Notes (Signed)
eLink Physician-Brief Progress Note Patient Name: Ashley Freeman DOB: 01-01-1991 MRN: [id number]   Date of Service  03/31/2021  HPI/Events of Note  Patient is C-Diff  positive with copious foul smelling, watery diarrhea.  eICU Interventions  Oral Vancomycin started for C-Diff infection.        Frederik Pear 03/31/2021, 9:37 PM

## 2021-03-31 NOTE — ED Notes (Signed)
CRITICAL VALUE STICKER  CRITICAL VALUE: PH 6.917, PCO2 less than 19  RECEIVER (on-site recipient of call):I. Callen Vancuren  DATE & TIME NOTIFIED: 03/31/21 1046  MESSENGER (representative from lab):lab  MD NOTIFIED: [place]  TIME OF NOTIFICATION:1046  RESPONSE:

## 2021-03-31 NOTE — Progress Notes (Addendum)
A consult was received from an ED physician for vancomycin and cefepime per pharmacy dosing.  The patient's profile has been reviewed for ht/wt/allergies/indication/available labs.   - Of note, patient received ceftriaxone and cefepime in the past.   A one time order has been placed for cefepime 2gm and vancomycin 1000 mg.  Further antibiotics/pharmacy consults should be ordered by admitting physician if indicated.                       Thank you, Lynelle Doctor 03/31/2021  10:54 AM

## 2021-03-31 NOTE — ED Notes (Signed)
CRITICAL VALUE STICKER  CRITICAL VALUE:Lactic 5.5, sodium 114   RECEIVER (on-site recipient of call):I.Klinton Candelas  DATE & TIME NOTIFIED: 1138 03/31/21  MESSENGER (representative from lab):Lab  MD NOTIFIED: [place]  TIME OF NOTIFICATION:1138  RESPONSE:

## 2021-03-31 NOTE — ED Provider Notes (Addendum)
I personally evaluated the patient during the encounter and completed a history, physical, procedures, medical decision making to contribute to the overall care of the patient and decision making for the patient briefly, the patient is a 30 y.o. female with confusion, elevated blood sugar.  Patient hypothermic, tachycardic with blood pressure with 562 systolic.  She is confused but does follow some commands.  Recently diagnosed with COVID and DKA.  She has been home with her mother but sounds like ongoing noncompliance with her diabetes regimen.  Was feeling weak last night and this morning patient is more weak and confused.  Mother thinks may be some facial swelling.  Patient has blood sugar of 1497.  pH is 6.9 with an undetectable bicarb.  White count is 57.  Potassium is 5.7.  Hemoglobin 7.8.  Lactic acid is 5.5.  Sepsis protocol initiated with broad-spectrum IV antibiotics.  COVID and flu test are negative.  Chest x-ray concerning for pneumonia.  Fluid resuscitation has been started with 2 L of normal saline.  We will start insulin infusion per Endo tool.  Pupils are equal and reactive.  Denies any trauma per mother.  She has no signs of dental infection on exam.  Submandibular area is soft but there is maybe some left-sided facial swelling.  Do not think there is any major infectious process in the face at this time.  Talk to the ICU team who recommend admission to stepdown and they are available if needed.  Blood pressure appears to be stable in the low 100s.  They recommend additional fluids at this time.  Continue treatment for sepsis.  Will talk to hospitalist for admission. Suspect post-influenza pneumonia.  Hospitalist prefers ICU admission.  ICU will come and evaluate the patient and anticipate admission with their service or hospitalist service.  This chart was dictated using voice recognition software.  Despite best efforts to proofread,  errors can occur which can change the documentation  meaning.  .Critical Care Performed by: Lennice Sites, DO Authorized by: Lennice Sites, DO   Critical care provider statement:    Critical care time (minutes):  35   Critical care was necessary to treat or prevent imminent or life-threatening deterioration of the following conditions:  Sepsis, CNS failure or compromise and endocrine crisis   Critical care was time spent personally by me on the following activities:  Blood draw for specimens, development of treatment plan with patient or surrogate, discussions with consultants, discussions with primary provider, evaluation of patient's response to treatment, examination of patient, obtaining history from patient or surrogate, ordering and performing treatments and interventions, ordering and review of laboratory studies, ordering and review of radiographic studies, pulse oximetry, review of old charts and re-evaluation of patient's condition   Care discussed with: admitting provider      EKG Interpretation  Date/Time:  Friday March 31 2021 10:32:07 EST Ventricular Rate:  104 PR Interval:  122 QRS Duration: 92 QT Interval:  371 QTC Calculation: 488 R Axis:   68 Text Interpretation: Sinus tachycardia Borderline ST depression, diffuse leads Borderline prolonged QT interval Confirmed by Lennice Sites ([id number]) on 03/31/2021 10:39:01 AM            Lennice Sites, DO 03/31/21 [place], [place], DO 03/31/21 [place], [place], DO 03/31/21 1234

## 2021-03-31 NOTE — ED Notes (Signed)
CRITICAL VALUE STICKER  CRITICAL VALUE:wbc 57.8  RECEIVER (on-site recipient of call):I. Patsey Pitstick  DATE & TIME NOTIFIED: 03/31/21 1039  MESSENGER (representative from lab):Elmyra Ricks  MD NOTIFIED: Ronnald Nian  TIME OF NOTIFICATION:1040  RESPONSE:

## 2021-03-31 NOTE — ED Notes (Signed)
Per Endo tool no change to insulin drip

## 2021-03-31 NOTE — ED Notes (Addendum)
CRITICAL VALUE STICKER  CRITICAL VALUE:BG 1330, Sodium 116, lactic acid 5.2  RECEIVER (on-site recipient of call): I.Fantasha Daniele  DATE & TIME NOTIFIED: 03/31/21 1314  MESSENGER (representative from lab):lab  MD NOTIFIED: [place]:

## 2021-03-31 NOTE — Progress Notes (Signed)
RN notified of ongoing diarrhea since ER assessment.  Note two recent admissions.  ABX on at least one of the two.     Plan: -assess Cdiff    Noe Gens, MSN, APRN, NP-C, AGACNP-BC [facility] 03/31/2021, 5:08 PM   Please see [email or web address] for pager details.   From 7A-7P if no response, please call [phone] After hours, please call ELink [phone]

## 2021-04-01 ENCOUNTER — Inpatient Hospital Stay (HOSPITAL_COMMUNITY): Payer: Medicaid Other

## 2021-04-01 DIAGNOSIS — A0472 Enterocolitis due to Clostridium difficile, not specified as recurrent: Secondary | ICD-10-CM | POA: Diagnosis not present

## 2021-04-01 DIAGNOSIS — I428 Other cardiomyopathies: Secondary | ICD-10-CM | POA: Diagnosis not present

## 2021-04-01 DIAGNOSIS — G9340 Encephalopathy, unspecified: Secondary | ICD-10-CM | POA: Diagnosis not present

## 2021-04-01 DIAGNOSIS — N179 Acute kidney failure, unspecified: Secondary | ICD-10-CM | POA: Diagnosis not present

## 2021-04-01 DIAGNOSIS — E872 Acidosis, unspecified: Secondary | ICD-10-CM | POA: Diagnosis not present

## 2021-04-01 LAB — BASIC METABOLIC PANEL
Anion gap: 11 (ref 5–15)
Anion gap: 10 (ref 5–15)
Anion gap: 8 (ref 5–15)
Anion gap: 7 (ref 5–15)
Anion gap: 6 (ref 5–15)
BUN: 35 mg/dL — ABNORMAL HIGH (ref 6–20)
BUN: 33 mg/dL — ABNORMAL HIGH (ref 6–20)
BUN: 31 mg/dL — ABNORMAL HIGH (ref 6–20)
BUN: 30 mg/dL — ABNORMAL HIGH (ref 6–20)
BUN: 29 mg/dL — ABNORMAL HIGH (ref 6–20)
CO2: 14 mmol/L — ABNORMAL LOW (ref 22–32)
CO2: 16 mmol/L — ABNORMAL LOW (ref 22–32)
CO2: 16 mmol/L — ABNORMAL LOW (ref 22–32)
CO2: 17 mmol/L — ABNORMAL LOW (ref 22–32)
CO2: 17 mmol/L — ABNORMAL LOW (ref 22–32)
Calcium: 7.3 mg/dL — ABNORMAL LOW (ref 8.9–10.3)
Calcium: 7.4 mg/dL — ABNORMAL LOW (ref 8.9–10.3)
Calcium: 7.5 mg/dL — ABNORMAL LOW (ref 8.9–10.3)
Calcium: 7.3 mg/dL — ABNORMAL LOW (ref 8.9–10.3)
Calcium: 7.4 mg/dL — ABNORMAL LOW (ref 8.9–10.3)
Chloride: 101 mmol/L (ref 98–111)
Chloride: 104 mmol/L (ref 98–111)
Chloride: 106 mmol/L (ref 98–111)
Chloride: 105 mmol/L (ref 98–111)
Chloride: 108 mmol/L (ref 98–111)
Creatinine, Ser: 2.43 mg/dL — ABNORMAL HIGH (ref 0.44–1.00)
Creatinine, Ser: 2.42 mg/dL — ABNORMAL HIGH (ref 0.44–1.00)
Creatinine, Ser: 2.29 mg/dL — ABNORMAL HIGH (ref 0.44–1.00)
Creatinine, Ser: 2.19 mg/dL — ABNORMAL HIGH (ref 0.44–1.00)
Creatinine, Ser: 2.2 mg/dL — ABNORMAL HIGH (ref 0.44–1.00)
GFR, Estimated: 27 mL/min — ABNORMAL LOW (ref >60)
GFR, Estimated: 27 mL/min — ABNORMAL LOW (ref >60)
GFR, Estimated: 29 mL/min — ABNORMAL LOW (ref >60)
GFR, Estimated: 31 mL/min — ABNORMAL LOW (ref >60)
GFR, Estimated: 31 mL/min — ABNORMAL LOW (ref >60)
Glucose, Bld: 681 mg/dL (ref 70–99)
Glucose, Bld: 424 mg/dL — ABNORMAL HIGH (ref 70–99)
Glucose, Bld: 151 mg/dL — ABNORMAL HIGH (ref 70–99)
Glucose, Bld: 144 mg/dL — ABNORMAL HIGH (ref 70–99)
Glucose, Bld: 148 mg/dL — ABNORMAL HIGH (ref 70–99)
Potassium: 3.3 mmol/L — ABNORMAL LOW (ref 3.5–5.1)
Potassium: 2.7 mmol/L — CL (ref 3.5–5.1)
Potassium: 3.2 mmol/L — ABNORMAL LOW (ref 3.5–5.1)
Potassium: 3.8 mmol/L (ref 3.5–5.1)
Potassium: 3.8 mmol/L (ref 3.5–5.1)
Sodium: 126 mmol/L — ABNORMAL LOW (ref 135–145)
Sodium: 130 mmol/L — ABNORMAL LOW (ref 135–145)
Sodium: 130 mmol/L — ABNORMAL LOW (ref 135–145)
Sodium: 129 mmol/L — ABNORMAL LOW (ref 135–145)
Sodium: 131 mmol/L — ABNORMAL LOW (ref 135–145)

## 2021-04-01 LAB — GLUCOSE, CAPILLARY
Glucose-Capillary: 138 mg/dL — ABNORMAL HIGH (ref 70–99)
Glucose-Capillary: 138 mg/dL — ABNORMAL HIGH (ref 70–99)
Glucose-Capillary: 140 mg/dL — ABNORMAL HIGH (ref 70–99)
Glucose-Capillary: 141 mg/dL — ABNORMAL HIGH (ref 70–99)
Glucose-Capillary: 148 mg/dL — ABNORMAL HIGH (ref 70–99)
Glucose-Capillary: 149 mg/dL — ABNORMAL HIGH (ref 70–99)
Glucose-Capillary: 161 mg/dL — ABNORMAL HIGH (ref 70–99)
Glucose-Capillary: 165 mg/dL — ABNORMAL HIGH (ref 70–99)
Glucose-Capillary: 186 mg/dL — ABNORMAL HIGH (ref 70–99)
Glucose-Capillary: 199 mg/dL — ABNORMAL HIGH (ref 70–99)
Glucose-Capillary: 200 mg/dL — ABNORMAL HIGH (ref 70–99)
Glucose-Capillary: 215 mg/dL — ABNORMAL HIGH (ref 70–99)
Glucose-Capillary: 294 mg/dL — ABNORMAL HIGH (ref 70–99)
Glucose-Capillary: 397 mg/dL — ABNORMAL HIGH (ref 70–99)
Glucose-Capillary: 413 mg/dL — ABNORMAL HIGH (ref 70–99)
Glucose-Capillary: 457 mg/dL — ABNORMAL HIGH (ref 70–99)
Glucose-Capillary: 493 mg/dL — ABNORMAL HIGH (ref 70–99)
Glucose-Capillary: 493 mg/dL — ABNORMAL HIGH (ref 70–99)
Glucose-Capillary: 593 mg/dL (ref 70–99)
Glucose-Capillary: >600 mg/dL (ref 70–99)
Glucose-Capillary: >600 mg/dL (ref 70–99)

## 2021-04-01 LAB — MAGNESIUM
Magnesium: 1.5 mg/dL — ABNORMAL LOW (ref 1.7–2.4)
Magnesium: 2.3 mg/dL (ref 1.7–2.4)

## 2021-04-01 LAB — BLOOD CULTURE ID PANEL (REFLEXED) - BCID2

## 2021-04-01 LAB — ECHOCARDIOGRAM COMPLETE
Area-P 1/2: 5.62 cm2
Calc EF: 57.9 %
Height: 63 in
S' Lateral: 2.7 cm
Single Plane A2C EF: 56.2 %
Single Plane A4C EF: 58.2 %
Weight: 2275.15 oz

## 2021-04-01 LAB — CBC
HCT: 18.8 % — ABNORMAL LOW (ref 36.0–46.0)
HCT: 19.9 % — ABNORMAL LOW (ref 36.0–46.0)
HCT: 26.5 % — ABNORMAL LOW (ref 36.0–46.0)
Hemoglobin: 6.5 g/dL — CL (ref 12.0–15.0)
Hemoglobin: 6.6 g/dL — CL (ref 12.0–15.0)
Hemoglobin: 9.2 g/dL — ABNORMAL LOW (ref 12.0–15.0)
MCH: 27.1 pg (ref 26.0–34.0)
MCH: 25.8 pg — ABNORMAL LOW (ref 26.0–34.0)
MCH: 27.6 pg (ref 26.0–34.0)
MCHC: 34.6 g/dL (ref 30.0–36.0)
MCHC: 33.2 g/dL (ref 30.0–36.0)
MCHC: 34.7 g/dL (ref 30.0–36.0)
MCV: 78.3 fL — ABNORMAL LOW (ref 80.0–100.0)
MCV: 77.7 fL — ABNORMAL LOW (ref 80.0–100.0)
MCV: 79.6 fL — ABNORMAL LOW (ref 80.0–100.0)
Platelets: 342 10*3/uL (ref 150–400)
Platelets: 347 10*3/uL (ref 150–400)
Platelets: 313 10*3/uL (ref 150–400)
RBC: 2.4 MIL/uL — ABNORMAL LOW (ref 3.87–5.11)
RBC: 2.56 MIL/uL — ABNORMAL LOW (ref 3.87–5.11)
RBC: 3.33 MIL/uL — ABNORMAL LOW (ref 3.87–5.11)
RDW: 13.2 % (ref 11.5–15.5)
RDW: 13.2 % (ref 11.5–15.5)
RDW: 15.9 % — ABNORMAL HIGH (ref 11.5–15.5)
WBC: 48.1 10*3/uL — ABNORMAL HIGH (ref 4.0–10.5)
WBC: 40.4 10*3/uL — ABNORMAL HIGH (ref 4.0–10.5)
WBC: 35.5 10*3/uL — ABNORMAL HIGH (ref 4.0–10.5)
nRBC: 0 % (ref 0.0–0.2)
nRBC: 0 % (ref 0.0–0.2)
nRBC: 0 % (ref 0.0–0.2)

## 2021-04-01 LAB — PREPARE RBC (CROSSMATCH)

## 2021-04-01 LAB — STREP PNEUMONIAE URINARY ANTIGEN
Strep Pneumo Urinary Antigen: NEGATIVE
Strep Pneumo Urinary Antigen: NEGATIVE

## 2021-04-01 LAB — COMPREHENSIVE METABOLIC PANEL
ALT: 16 U/L (ref 0–44)
AST: 24 U/L (ref 15–41)
Albumin: 1.6 g/dL — ABNORMAL LOW (ref 3.5–5.0)
Alkaline Phosphatase: 109 U/L (ref 38–126)
Anion gap: 7 (ref 5–15)
BUN: 28 mg/dL — ABNORMAL HIGH (ref 6–20)
CO2: 16 mmol/L — ABNORMAL LOW (ref 22–32)
Calcium: 7.7 mg/dL — ABNORMAL LOW (ref 8.9–10.3)
Chloride: 110 mmol/L (ref 98–111)
Creatinine, Ser: 2.08 mg/dL — ABNORMAL HIGH (ref 0.44–1.00)
GFR, Estimated: 33 mL/min — ABNORMAL LOW (ref >60)
Glucose, Bld: 184 mg/dL — ABNORMAL HIGH (ref 70–99)
Potassium: 3.6 mmol/L (ref 3.5–5.1)
Sodium: 133 mmol/L — ABNORMAL LOW (ref 135–145)
Total Bilirubin: 0.6 mg/dL (ref 0.3–1.2)
Total Protein: 4.6 g/dL — ABNORMAL LOW (ref 6.5–8.1)

## 2021-04-01 LAB — BETA-HYDROXYBUTYRIC ACID: Beta-Hydroxybutyric Acid: 0.07 mmol/L (ref 0.05–0.27)

## 2021-04-01 LAB — MRSA NEXT GEN BY PCR, NASAL: MRSA by PCR Next Gen: NOT DETECTED

## 2021-04-01 LAB — HIV ANTIBODY (ROUTINE TESTING W REFLEX): HIV Screen 4th Generation wRfx: NONREACTIVE

## 2021-04-01 LAB — PROCALCITONIN: Procalcitonin: 7.12 ng/mL

## 2021-04-01 LAB — PHOSPHORUS
Phosphorus: 2 mg/dL — ABNORMAL LOW (ref 2.5–4.6)
Phosphorus: 1.8 mg/dL — ABNORMAL LOW (ref 2.5–4.6)

## 2021-04-01 LAB — CLOSTRIDIUM DIFFICILE BY PCR, REFLEXED: Toxigenic C. Difficile by PCR: POSITIVE — AB

## 2021-04-01 MED ORDER — POTASSIUM PHOSPHATES 15 MMOLE/5ML IV SOLN
15.0000 mmol | Freq: Once | INTRAVENOUS | Status: AC
  Administered 2021-04-01: 15 mmol via INTRAVENOUS
  Filled 2021-04-01: qty 5

## 2021-04-01 MED ORDER — METRONIDAZOLE 500 MG/100ML IV SOLN
500.0000 mg | Freq: Three times a day (TID) | INTRAVENOUS | Status: DC
  Administered 2021-04-01 – 2021-04-04 (×9): 500 mg via INTRAVENOUS
  Filled 2021-04-01 (×9): qty 100

## 2021-04-01 MED ORDER — VANCOMYCIN HCL 1250 MG/250ML IV SOLN
1250.0000 mg | INTRAVENOUS | Status: DC
  Administered 2021-04-02: 10:00:00 1250 mg via INTRAVENOUS
  Filled 2021-04-01: qty 250

## 2021-04-01 MED ORDER — POTASSIUM CHLORIDE 10 MEQ/100ML IV SOLN
10.0000 meq | INTRAVENOUS | Status: AC
  Administered 2021-04-01 (×6): 10 meq via INTRAVENOUS
  Filled 2021-04-01 (×6): qty 100

## 2021-04-01 MED ORDER — ORAL CARE MOUTH RINSE
15.0000 mL | Freq: Two times a day (BID) | OROMUCOSAL | Status: DC
  Administered 2021-04-01 – 2021-04-04 (×7): 15 mL via OROMUCOSAL

## 2021-04-01 MED ORDER — CEFEPIME HCL 2 G IJ SOLR
2.0000 g | Freq: Two times a day (BID) | INTRAMUSCULAR | Status: DC
  Administered 2021-04-01 – 2021-04-02 (×2): 2 g via INTRAVENOUS
  Filled 2021-04-01 (×2): qty 2

## 2021-04-01 MED ORDER — SODIUM CHLORIDE 0.9% IV SOLUTION: Freq: Once | INTRAVENOUS | Status: AC

## 2021-04-01 MED ORDER — MAGNESIUM SULFATE 4 GM/100ML IV SOLN
4.0000 g | Freq: Once | INTRAVENOUS | Status: AC
  Administered 2021-04-01: 4 g via INTRAVENOUS
  Filled 2021-04-01: qty 100

## 2021-04-01 NOTE — Progress Notes (Signed)
eLink Physician-Brief Progress Note Patient Name: DESHANTA LADY DOB: 1990-11-28 MRN: [id number]   Date of Service  04/01/2021  HPI/Events of Note  K+ 2.7, Creatinine 2.42.  eICU Interventions  Modified K+ replacement protocol with KCL 10 meq iv Q 1 hour x 6 ordered.        Kerry Kass Dee Paden 04/01/2021, 6:03 AM

## 2021-04-01 NOTE — Progress Notes (Signed)
Patient unable to sign consent for blood transfusion. Mother called but didn't answer, left voicemail to call back.   Elink notified,  Will continue to monitor

## 2021-04-01 NOTE — Progress Notes (Signed)
Pharmacy Antibiotic Note  Ashley Freeman is a 30 y.o. female admitted on 03/31/2021 with sepsis.  Pharmacy has been consulted for vancomycin & cefepime dosing. She was given cefepime 2 gm in ED  @ 1109 am & vancomycin 1 gm @ 1113 am.  WBC 57.8, SCr 2.7, Lactate 5.3, hypothermic. Temp 94.5  HCG quant neg  Plan: Vanc 1 gm + 500 mg, then 1250 mg q48, AUC 456, SCr 2.3 Cefepime 2 gm IV q12 Po Vancomycin 125mg  qid   Height: 5\' 3"  (160 cm) Weight: 64.5 kg (142 lb 3.2 oz) IBW/kg (Calculated) : 52.4  Temp (24hrs), Avg:97.4 F (36.3 C), Min:93.2 F (34 C), Max:99.7 F (37.6 C)  Recent Labs  Lab 03/31/21 1018 03/31/21 1050 03/31/21 1218 03/31/21 1239 03/31/21 1503 03/31/21 1941 04/01/21 0022 04/01/21 0334 04/01/21 0756  WBC 57.8*  --   --   --   --   --  48.1*  --   --   CREATININE 2.97*  --    < >  --  2.62* 2.62* 2.43* 2.42* 2.29*  LATICACIDVEN  --  5.5*  --  5.2*  --   --   --   --   --    < > = values in this interval not displayed.     Estimated Creatinine Clearance: 33 mL/min (A) (by C-G formula based on SCr of 2.29 mg/dL (H)).    Allergies  Allergen Reactions   Cephalexin Anaphylaxis   Penicillins Hives and Rash    Has patient had a PCN reaction causing immediate rash, facial/tongue/throat swelling, SOB or lightheadedness with hypotension: Yes Has patient had a PCN reaction causing severe rash involving mucus membranes or skin necrosis: No Has patient had a PCN reaction that required hospitalization: Yes Has patient had a PCN reaction occurring within the last 10 years: No Spoke with pt - childhood hives told by mom, tried no pcns since, doesn't remember reaction herself    Benadryl [Diphenhydramine] Itching   Doxycycline Itching    Antimicrobials this admission: 12/2 cefepime> 12/2 vancomycin>>  Dose adjustments this admission: Cefepime 2gm q24 > q12  Microbiology results: 12/2 UCx: sent 12/2 BCx2: BCID 12/3 with 1 of 4 GVR, no ID 12/2 MRSA   12/2 C diff Ag +, toxin -, PCR +, treat with po Vanc 12/2 Resp panel: Covid & Flu neg 12/3 Strep pneumo/Legionella: neg/in process  Prev Cx 11/21: Covid & FluA positive 11/21 BCx: ng-final  Thank you for allowing pharmacy to be a part of this patient's care.  [name] PharmD [facility] [phone] 04/01/2021, 10:11 AM

## 2021-04-01 NOTE — Progress Notes (Signed)
eLink Physician-Brief Progress Note Patient Name: Ashley Freeman DOB: Nov 21, 1990 MRN: [id number]   Date of Service  04/01/2021  HPI/Events of Note  Mg++ 1.5, Hemoglobin 6.5 gm / dl.  eICU Interventions  4 gm of magnesium sulfate ordered iv per protocol, one unit PRBC ordered transfused per protocol, post-transfusion H & H ordered.        Ashley Freeman 04/01/2021, 3:00 AM

## 2021-04-01 NOTE — Progress Notes (Signed)
Repeat hemoglobin remains less than 7. MD notified. New phone number for patient's mother updated in the chart, blood transfusion consent obtained. Will administer 1 u PRBC per order.

## 2021-04-01 NOTE — Progress Notes (Addendum)
Patinet apparently refusing po vanc  Plan Rn to encourage PO Vanc but also add IV flagyl for c diff - do both drugs    SIGNATURE    Dr. Brand Males, M.D., F.C.C.P,  Pulmonary and Critical Care Medicine Staff Physician, [place] Director - Interstitial Lung Disease  Program  Pulmonary [place] at [place], [place], [address]  NPI Number:  NPI #[id number]  Pager: [phone], If no answer  -> Check AMION or Try [phone] Telephone (clinical office): [phone] Telephone (research): [phone]  1:42 PM 04/01/2021

## 2021-04-01 NOTE — Progress Notes (Signed)
PHARMACY - PHYSICIAN COMMUNICATION CRITICAL VALUE ALERT - BLOOD CULTURE IDENTIFICATION (BCID)  Ashley Freeman is an 30 y.o. female who presented to [facility] on 03/31/2021 with a chief complaint of DKA, r/o sepsis  Assessment: 12/2 BCx, BCID resulted 12/3 with 1 of 4 bottles (aerobic) with GVR,   Name of physician (or Provider) Contacted: Ramaswamy  Current antibiotics: Vancomycin & Cefepime  Changes to prescribed antibiotics recommended: no change abx   Results for orders placed or performed during the hospital encounter of 03/31/21  Blood Culture ID Panel (Reflexed) (Collected: 03/31/2021 10:47 AM)  Result Value Ref Range   Enterococcus faecalis NOT DETECTED NOT DETECTED   Enterococcus Faecium NOT DETECTED NOT DETECTED   Listeria monocytogenes NOT DETECTED NOT DETECTED   Staphylococcus species NOT DETECTED NOT DETECTED   Staphylococcus aureus (BCID) NOT DETECTED NOT DETECTED   Staphylococcus epidermidis NOT DETECTED NOT DETECTED   Staphylococcus lugdunensis NOT DETECTED NOT DETECTED   Streptococcus species NOT DETECTED NOT DETECTED   Streptococcus agalactiae NOT DETECTED NOT DETECTED   Streptococcus pneumoniae NOT DETECTED NOT DETECTED   Streptococcus pyogenes NOT DETECTED NOT DETECTED   A.calcoaceticus-baumannii NOT DETECTED NOT DETECTED   Bacteroides fragilis NOT DETECTED NOT DETECTED   Enterobacterales NOT DETECTED NOT DETECTED   Enterobacter cloacae complex NOT DETECTED NOT DETECTED   Escherichia coli NOT DETECTED NOT DETECTED   Klebsiella aerogenes NOT DETECTED NOT DETECTED   Klebsiella oxytoca NOT DETECTED NOT DETECTED   Klebsiella pneumoniae NOT DETECTED NOT DETECTED   Proteus species NOT DETECTED NOT DETECTED   Salmonella species NOT DETECTED NOT DETECTED   Serratia marcescens NOT DETECTED NOT DETECTED   Haemophilus influenzae NOT DETECTED NOT DETECTED   Neisseria meningitidis NOT DETECTED NOT DETECTED   Pseudomonas aeruginosa NOT DETECTED NOT DETECTED    Stenotrophomonas maltophilia NOT DETECTED NOT DETECTED   Candida albicans NOT DETECTED NOT DETECTED   Candida auris NOT DETECTED NOT DETECTED   Candida glabrata NOT DETECTED NOT DETECTED   Candida krusei NOT DETECTED NOT DETECTED   Candida parapsilosis NOT DETECTED NOT DETECTED   Candida tropicalis NOT DETECTED NOT DETECTED   Cryptococcus neoformans/gattii NOT DETECTED NOT DETECTED    [name] PharmD 04/01/2021  9:36 AM

## 2021-04-01 NOTE — Progress Notes (Addendum)
NAME:  Ashley Freeman, MRN:  [id number], DOB:  [birth year], LOS: 0 ADMISSION DATE:  03/31/2021, CONSULTATION DATE: 03/31/2021 REFERRING MD: Dr. Ronnald Nian, CHIEF COMPLAINT: Elevated glucose  BRIEF   30 year old female who presented to [facility], ER on 12/30 with reports of elevated blood glucose.   She recently was admitted at [facility] from 11/21 through 11/27 with DKA in the setting of COVID-19 and influenza infection.  On presentation 12/2 the patient was found to be hypothermic, tachycardic with soft normal blood pressures.  She was altered and intermittently able to follow some commands.  Her mother reported that she had been noncompliant with home insulin regimen.  Patient reportedly has an insulin pump but has not been using.  Initial VBG 6.9 / <19.  Labs notable for NA 116, K5.4, CL 89, CO2 <7, glucose 1330, BUN 39, creatinine 2.7, lactic acid 5.2, WBC 57.8, hemoglobin 7.8, MCV 100.3, platelets 459 and INR 1.6.  Repeat influenza and COVID screening negative.  Urinalysis notable for glucose greater than 500, 20 ketones, protein 100 and rare bacteria.  Chest x-ray with mild left lower streaky opacities thought to be atelectasis vs early pneumonia.  Patient was treated with IV fluid resuscitation and insulin infusion. There was question of patient starting her menses vs blood from stool in ER.  Episode of diarrhea in ER.   PCCM called for ICU admission.   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  Not on pressors not on the ventilator not on oxygen.  Periorbital swelling continues.  She is more awake and alert and oriented x4.  Diarrhea continues.  C. difficile antigen is newly positive compared to June 2022.  The C. difficile PCR is also positive but toxin is negative.  Foul-smelling diarrhea reported.  Patient had high white count.  Oral vancomycin started.  Hemoglobin less than 7 g%.  She orally consented for transfusion this morning.  Magnesium has been replaced.  Creatinine improved.  Lactic acid improved.  Bicarb 16 Corrected anion gap 14  Objective    Vitals with BMI 04/01/2021 04/01/2021 04/01/2021  Height - - -  Weight - 142 lbs 3 oz -  BMI - 71.0 -  Systolic [phone]  Diastolic 56 66 89  Pulse 270 111 116       Examination: General Appearance:  Looks criticall ill but improved Head:  Normocephalic, without obvious abnormality, atraumatic Eyes:  PERRL - yes, conjunctiva/corneas - muddy. PERIOBRBIAL EDEMA +     Ears:  Normal external ear canals, both ears Nose:  G tube - no Throat:  ETT TUBE - no , OG tube - no Neck:  Supple,  No enlargement/tenderness/nodules Lungs: Clear to auscultation bilaterally,  Heart:  S1 and S2 normal, no murmur, CVP - no.  Pressors - no Abdomen:  Soft, no masses, no organomegaly Genitalia / Rectal:  Not done Extremities:  Extremities- intact Skin:  ntact  in exposed areas . Sacral area - not examinee Neurologic:  Sedation - none -> RASS - +1 . Moves all 4s - yes. CAM-ICU - neg . Orientation - x3+     Resolved Hospital Problem list     Assessment & Plan:    PULMONARY  A:  At risk for intubation at the time of admission 03/31/2021 due to severe DKA  04/01/2021 -> protecting airway and risk of intubation is diminished  P:   Monitor   NEUROLOGIC A:   Severe acute metabolic obtunded encephalopathy at admission 03/31/2021  04/01/2021: Improving.  More  arousable alert and oriented x4.  Still easily sleepy  P:   Monitor improvement with DKA treatment    VASCULAR A:   Severe dehydration at admission due to DKA and also C. difficile diarrhea at admission Normotensive  04/01/2021: Maintaining blood pressure.  Dehydration improving.  P:  Mean artery pressure goal greater than 65  CARDIAC STRUCTURAL A: Normal ejection fraction EF greater than 65% in August 2022  P: Monitor  CARDIAC ELECTRICAL A: Sinus tachycardia at admission  04/01/2021: Heart rate 109 improving  P: Telemetry monitoring  INFECTIOUS A:   Frequent history of sexually transmitted diseases at baseline prior to admission Severe sepsis at admission based on high white count and high procalcitonin 03/31/2021 Etiology: Single issue of severe C. difficile diarrhea [antigen positive, PCR positive but toxin negative and diarrhea plus]  04/01/2021: Improving white count  P:   Await HIV 04/01/2021 Check urine strep and urine leg 04/01/21  Stop systemic antibiotics but broaden of any evidnce of other bacteria Oral vancomycin since 04/01/2021 for C. Difficile Get CXR 2 view when more stable   RENAL A:  Diabetic proteinuria with stage III chronic kidney disease at baseline -prior to and present on admission\ Acute on chronic kidney injury at admission 03/31/2021  04/01/2021: Improving creatinine  P:  Monitor  METABOLIC  A: Severe DKA with lactic acidosis greater than 5 -present on admission.  Due to noncompliance and also community C. Difficile  04/01/2021: Anion gap 14 corrected for low albumin and bicarb 16 and improving  Plan - Recheck lactic acidosis - Continue DKA protocol  ELECTROLYTES A:  Severe hyponatremia at admission 03/31/2021 Mild hyperkalemia at admission 03/31/2021  04/01/2021: Magnesium replaced and potassium replaced.  Phosphorus also low.  P: Replete mag Foss and potassium   GASTROINTESTINAL A:  Severe protein calorie malnutrition  (alb 2.4 on 03/08/2021] - Prior to & Present on Admit Severe C. difficile diarrhea at admission 03/31/2021 [though toxin negative is having diarrhea and is new PCR and antigen positive with severe high white count and recent abx hix] - community C diff  P:   P.o. vancomycin per protocol since 04/01/2021 No PPI DC as needed laxatives  HEMATOLOGIC   - HEME A:  Chronic anemia -prior to and present on admission  04/01/2021: Hemoglobin less than 7 g%   P:  - PRBC for hgb </= 6.9gm%    - exceptions are   -  if ACS susepcted/confirmed then transfuse for hgb </= 8.0gm%,  or    -  active bleeding with hemodynamic instability, then transfuse regardless of hemoglobin value   At at all times try to transfuse 1 unit prbc as possible with exception of active hemorrhage   HEMATOLOGIC - Platelets A Thrombocytosis at admission 03/31/2021  04/01/2021: Normalized platelets  P Monitor platelets with subcutaneous heparin DVT prophylaxis  ENDOCRINE A:   Type 1 diabetes with retinopathy, proteinuria and chronic  kidney disease a- prior to and present on admission Noncompliance -prior to and present on admission  P:   After DKA protocol will need sliding scale insulin and type 1 diabetes protocol in the hospital  MSK/DERM Has periorbital edema -present on admission.  New?  Due to dry eyes  Plan - Warm pads to the orbits    Best Practice (right click and "Reselect all SmartList Selections" daily)  Diet/type: NPO but can eat once gap/bic norrmalized and awake DVT prophylaxis: prophylactic heparin  GI prophylaxis: N/A Lines: N/A Foley:  Yes, and it is still needed Code Status:  full code Last date of multidisciplinary goals of care discussion - pending.  Mother, Mikki Santee [phone] or [phone]   Dispo - improved . Can go to SDU but still critically ill. [facility] dprimary from 04/02/21 - d/w Dr Berle Mull   ATTESTATION & SIGNATURE   The patient Nahima Ales [place]  is critically ill with multiple organ systems failure and requires high complexity decision making for assessment and support, frequent evaluation and titration of therapies, application of advanced monitoring technologies and extensive interpretation of multiple databases.   Critical Care Time devoted to patient care services described in this note is  35  Minutes. This time reflects time of care of this signee Dr Brand Males. This critical care time does not reflect procedure time, or teaching time or supervisory time of PA/NP/Med student/Med Resident etc but could involve care discussion time     Dr. Brand Males, M.D., [facility].C.P Pulmonary and Critical Care Medicine Staff Physician [facility] Pager: [phone], If no answer or between  15:00h - 7:00h: call [phone]  04/01/2021 7:44 AM    LABS    PULMONARY Recent Labs  Lab 03/31/21 1018 03/31/21 1852  PHART  --  7.157*  PCO2ART  --  18.3*  PO2ART  --  91.3  HCO3 2.7* 6.2*  O2SAT 66.9 95.2    CBC Recent Labs  Lab 03/31/21 1018 04/01/21 0022  HGB 7.8* 6.5*  HCT 29.5* 18.8*  WBC 57.8* 48.1*  PLT 459* 342    COAGULATION Recent Labs  Lab 03/31/21 1043  INR 1.6*    CARDIAC  No results for input(s): TROPONINI in the last 168 hours. No results for input(s): PROBNP in the last 168 hours.   CHEMISTRY Recent Labs  Lab 03/31/21 1218 03/31/21 1503 03/31/21 1941 04/01/21 0022 04/01/21 0334  NA 116* 120* 124* 126* 130*  K 5.4* 4.1 3.6 3.3* 2.7*  CL 89* 90* 98 101 104  CO2 <7* <7* 7* 14* 16*  GLUCOSE 1,330* 1,219* 933* 681* 424*  BUN 39* 31* 35* 35* 33*  CREATININE 2.70* 2.62* 2.62* 2.43* 2.42*  CALCIUM 7.0* 7.0* 7.1* 7.3* 7.4*  MG  --   --   --  1.5*  --   PHOS  --   --   --  2.0*  --    Estimated Creatinine Clearance: 31.3 mL/min (A) (by C-G formula based on SCr of 2.42 mg/dL (H)).   LIVER Recent Labs  Lab 03/31/21 1018 03/31/21 1043  AST  20  --   ALT 19  --   ALKPHOS 169*  --   BILITOT 1.3*  --   PROT 5.5*  --   ALBUMIN 2.3*  --   INR  --  1.6*     INFECTIOUS Recent Labs  Lab 03/31/21 1050 03/31/21 1218 03/31/21 1239 04/01/21 0022  LATICACIDVEN  5.5*  --  5.2*  --   PROCALCITON  --  2.92  --  7.12     ENDOCRINE CBG (last 3)  Recent Labs    04/01/21 0516 04/01/21 0613 04/01/21 0733  GLUCAP 294* 215* 186*         IMAGING x48h  - image(s) personally visualized  -   highlighted in bold DG CHEST PORT 1 VIEW  Result Date: 03/31/2021 CLINICAL DATA:  Shortness of breath EXAM: PORTABLE CHEST 1 VIEW COMPARISON:  03/31/2021 FINDINGS: The heart size and mediastinal contours are within normal limits. Both lungs are clear. The visualized skeletal structures are unremarkable. IMPRESSION: No active disease. Electronically Signed   By: Jerilynn Mages.  Shick M.D.   On: 03/31/2021 15:49   DG Chest Portable 1 View  Result Date: 03/31/2021 CLINICAL DATA:  Diabetic ketoacidosis EXAM: PORTABLE CHEST 1 VIEW COMPARISON:  Prior chest x-ray 03/20/2021 FINDINGS: Low inspiratory volumes. Streaky opacities in the left lower lobe favored to reflect atelectasis. Cardiac and mediastinal contours are normal. No pleural effusion or pneumothorax. No acute osseous abnormality. IMPRESSION: Streaky airspace opacities in the left lower lobe favored to reflect atelectasis. Early bronchopneumonia is difficult to exclude entirely. Recommend dedicated PA and lateral chest x-ray when the patient is able. Electronically Signed   By: Jacqulynn Cadet M.D.   On: 03/31/2021 11:11

## 2021-04-01 NOTE — Progress Notes (Signed)
  Echocardiogram 2D Echocardiogram has been performed.  Ashley Freeman 04/01/2021, 11:27 AM

## 2021-04-01 NOTE — Progress Notes (Signed)
Patient's IV kinked/malpositioned, required to be removed. Insulin drip and maintenance fluids on hold (blood infusing). STAT IV team consult placed. MD notified.

## 2021-04-02 DIAGNOSIS — E1110 Type 2 diabetes mellitus with ketoacidosis without coma: Secondary | ICD-10-CM

## 2021-04-02 DIAGNOSIS — A0472 Enterocolitis due to Clostridium difficile, not specified as recurrent: Secondary | ICD-10-CM | POA: Diagnosis not present

## 2021-04-02 DIAGNOSIS — E876 Hypokalemia: Secondary | ICD-10-CM | POA: Diagnosis not present

## 2021-04-02 LAB — CBC
HCT: 24.4 % — ABNORMAL LOW (ref 36.0–46.0)
Hemoglobin: 8.4 g/dL — ABNORMAL LOW (ref 12.0–15.0)
MCH: 27.4 pg (ref 26.0–34.0)
MCHC: 34.4 g/dL (ref 30.0–36.0)
MCV: 79.5 fL — ABNORMAL LOW (ref 80.0–100.0)
Platelets: 309 10*3/uL (ref 150–400)
RBC: 3.07 MIL/uL — ABNORMAL LOW (ref 3.87–5.11)
RDW: 15.2 % (ref 11.5–15.5)
WBC: 28.4 10*3/uL — ABNORMAL HIGH (ref 4.0–10.5)
nRBC: 0 % (ref 0.0–0.2)

## 2021-04-02 LAB — GLUCOSE, CAPILLARY
Glucose-Capillary: 121 mg/dL — ABNORMAL HIGH (ref 70–99)
Glucose-Capillary: 123 mg/dL — ABNORMAL HIGH (ref 70–99)
Glucose-Capillary: 124 mg/dL — ABNORMAL HIGH (ref 70–99)
Glucose-Capillary: 139 mg/dL — ABNORMAL HIGH (ref 70–99)
Glucose-Capillary: 145 mg/dL — ABNORMAL HIGH (ref 70–99)
Glucose-Capillary: 151 mg/dL — ABNORMAL HIGH (ref 70–99)
Glucose-Capillary: 160 mg/dL — ABNORMAL HIGH (ref 70–99)
Glucose-Capillary: 163 mg/dL — ABNORMAL HIGH (ref 70–99)
Glucose-Capillary: 164 mg/dL — ABNORMAL HIGH (ref 70–99)
Glucose-Capillary: 167 mg/dL — ABNORMAL HIGH (ref 70–99)
Glucose-Capillary: 168 mg/dL — ABNORMAL HIGH (ref 70–99)
Glucose-Capillary: 83 mg/dL (ref 70–99)

## 2021-04-02 LAB — COMPREHENSIVE METABOLIC PANEL
ALT: 16 U/L (ref 0–44)
AST: 20 U/L (ref 15–41)
Albumin: <1.5 g/dL — ABNORMAL LOW (ref 3.5–5.0)
Alkaline Phosphatase: 108 U/L (ref 38–126)
Anion gap: 6 (ref 5–15)
BUN: 23 mg/dL — ABNORMAL HIGH (ref 6–20)
CO2: 17 mmol/L — ABNORMAL LOW (ref 22–32)
Calcium: 7.3 mg/dL — ABNORMAL LOW (ref 8.9–10.3)
Chloride: 110 mmol/L (ref 98–111)
Creatinine, Ser: 1.94 mg/dL — ABNORMAL HIGH (ref 0.44–1.00)
GFR, Estimated: 36 mL/min — ABNORMAL LOW (ref >60)
Glucose, Bld: 130 mg/dL — ABNORMAL HIGH (ref 70–99)
Potassium: 3.6 mmol/L (ref 3.5–5.1)
Sodium: 133 mmol/L — ABNORMAL LOW (ref 135–145)
Total Bilirubin: 0.5 mg/dL (ref 0.3–1.2)
Total Protein: 4.1 g/dL — ABNORMAL LOW (ref 6.5–8.1)

## 2021-04-02 LAB — URINE CULTURE: Culture: NO GROWTH

## 2021-04-02 LAB — PROCALCITONIN: Procalcitonin: 5.83 ng/mL

## 2021-04-02 LAB — MAGNESIUM: Magnesium: 2.1 mg/dL (ref 1.7–2.4)

## 2021-04-02 LAB — PHOSPHORUS: Phosphorus: 1.8 mg/dL — ABNORMAL LOW (ref 2.5–4.6)

## 2021-04-02 LAB — LACTIC ACID, PLASMA: Lactic Acid, Venous: 1.1 mmol/L (ref 0.5–1.9)

## 2021-04-02 MED ORDER — HYDRALAZINE HCL 20 MG/ML IJ SOLN
10.0000 mg | Freq: Four times a day (QID) | INTRAMUSCULAR | Status: DC | PRN
  Administered 2021-04-02 – 2021-04-03 (×2): 10 mg via INTRAVENOUS
  Filled 2021-04-02 (×2): qty 1

## 2021-04-02 MED ORDER — GUAIFENESIN 100 MG/5ML PO LIQD
5.0000 mL | ORAL | Status: DC | PRN
  Administered 2021-04-02 – 2021-04-03 (×2): 5 mL via ORAL
  Filled 2021-04-02 (×2): qty 10

## 2021-04-02 MED ORDER — POTASSIUM PHOSPHATES 15 MMOLE/5ML IV SOLN
15.0000 mmol | Freq: Once | INTRAVENOUS | Status: AC
  Administered 2021-04-02: 07:00:00 15 mmol via INTRAVENOUS
  Filled 2021-04-02: qty 5

## 2021-04-02 MED ORDER — INSULIN ASPART 100 UNIT/ML IJ SOLN: 0.0000 [IU] | Freq: Every day | INTRAMUSCULAR | Status: DC

## 2021-04-02 MED ORDER — INSULIN ASPART 100 UNIT/ML IJ SOLN
0.0000 [IU] | Freq: Three times a day (TID) | INTRAMUSCULAR | Status: DC
  Administered 2021-04-02 – 2021-04-03 (×2): 2 [IU] via SUBCUTANEOUS
  Administered 2021-04-03 – 2021-04-04 (×2): 1 [IU] via SUBCUTANEOUS
  Administered 2021-04-04: 2 [IU] via SUBCUTANEOUS

## 2021-04-02 MED ORDER — INSULIN GLARGINE-YFGN 100 UNIT/ML ~~LOC~~ SOLN
15.0000 [IU] | Freq: Once | SUBCUTANEOUS | Status: AC
  Administered 2021-04-02: 11:00:00 15 [IU] via SUBCUTANEOUS
  Filled 2021-04-02: qty 0.15

## 2021-04-02 MED ORDER — ACETAMINOPHEN 325 MG PO TABS
650.0000 mg | ORAL_TABLET | Freq: Four times a day (QID) | ORAL | Status: DC | PRN
  Administered 2021-04-02: 23:00:00 650 mg via ORAL
  Filled 2021-04-02: qty 2

## 2021-04-02 MED ORDER — INSULIN ASPART 100 UNIT/ML IJ SOLN
3.0000 [IU] | Freq: Three times a day (TID) | INTRAMUSCULAR | Status: DC
  Administered 2021-04-02 – 2021-04-03 (×3): 3 [IU] via SUBCUTANEOUS

## 2021-04-02 MED ORDER — INSULIN GLARGINE-YFGN 100 UNIT/ML ~~LOC~~ SOLN
15.0000 [IU] | Freq: Every day | SUBCUTANEOUS | Status: DC
  Administered 2021-04-03 – 2021-04-04 (×2): 15 [IU] via SUBCUTANEOUS
  Filled 2021-04-02 (×3): qty 0.15

## 2021-04-02 MED ORDER — INSULIN ASPART 100 UNIT/ML IJ SOLN
0.0000 [IU] | Freq: Three times a day (TID) | INTRAMUSCULAR | Status: DC
Start: 2021-04-02 — End: 2021-04-02

## 2021-04-02 NOTE — Progress Notes (Addendum)
PROGRESS NOTE  Ashley Freeman GUR:[id number] DOB: 03-12-1991 DOA: 03/31/2021 PCP: [name], PA-C   LOS: 2 days   Brief narrative: 30 year old female with past medical history of type 1 diabetes with chronic kidney disease and neuropathy presented to hospital on 04/28/2021 with elevated blood glucose levels.  Of note, she was recently admitted at [facility] hospital from 11/21 through 11/27 with DKA in the setting of COVID-19 and influenza infection.  On this presentation patient was noted to be hypothermic, tachycardic with low blood pressure and some mental confusion.  Patient's mother reported that she had been noncompliant with home insulin regimen, was reportedly on insulin pump that has not been used.  Initial venous blood gas showed pH of 6.9 with sodium of 116 and potassium of 5.4.  Bicarb was less than 7 with glucose of 1330.  BUN is elevated with a creatinine of 2.7 and lactate elevated at 5.2.  Patient did have leukocytosis with WBC of 57.8 and INR of 1.6.  Repeat influenza and COVID screening test was negative.  Chest x-ray showed mild left lower streaky opacities thought to be secondary to atelectasis versus early pneumonia.  Also had some diarrhea in the ED.  Patient was given IV fluid resuscitation, IV insulin and was admitted to the hospital.  During hospitalization, patient was treated with IV insulin for DKA and has been started on p.o. vancomycin for C. difficile diarrhea.  Has been empirically on antibiotics for possible sepsis.  Patient was subsequently considered stable for transfer out of the ICU to medical service on 04/02/2021.   Assessment/Plan:  Active Problems:   DKA (diabetic ketoacidosis) (HCC)  Severe diabetic ketoacidosis.  Likely secondary to noncompliance possible infection.   Still on insulin drip.  Will transition to long-acting insulin and sliding scale insulin.  Will initiate oral diet.  Latest hemoglobin A1c of 10.5.  Acute metabolic  encephalopathy secondary to DKA.  Still NPO.  Urine drug screen was negative.  TSH 3.6.  Encephalopathy has improved.  We will consider oral diet  Severe volume depletion secondary to DKA and diarrhea.  Received IV fluids  Bacteremia with Gram variable rods.  Likely contaminant.  Currently on IV vancomycin and cefepime.  We will discontinue vancomycin and cefepime given the possibility of C. difficile infection.  Sepsis possibly secondary to C. difficile diarrhea.  Antigen positive but toxin negative.  Very high WBC count with foul-smelling diarrhea.  Has been empirically started on oral vancomycin for high suspicion of C. difficile.  She has been refusing oral vancomycin so IV Flagyl was initiated as well.  Patient does have rectal tube.  At this time we will discontinue cefepime and vancomycin.  Significant acute on chronic anemia.  Patient received 1 unit of packed RBC for hemoglobin of 6.6..  Significant leukocytosis.  Latest WBC count at 28.4.  Secondary to sepsis, C. difficile infection.  Continue to monitor CBC.  Hyponatremia. Mild at this time.  We will continue to monitor closely.  Hypophosphatemia.  Phosphorus of 1.8 today.  Has received IV K-Phos.  Check levels in a.m.  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  Secondary to diabetes.  Continue with IV fluids.  Creatinine today at 1.9  Severe protein calorie malnutrition.  Present on admission.  Encourage oral nutrition when able.  DVT prophylaxis: heparin injection 5,000 Units Start: 03/31/21 1430 SCDs Start: 03/31/21 1419   Code Status: Full code  Family Communication:  Spoke with the patient at bedside.  Status is: Inpatient  Remains inpatient  appropriate because: DKA, electrolyte imbalance, C. difficile infection,  Consultants: PCCM  Procedures: None  Anti-infectives:  oral vancomycin  Anti-infectives (From admission, onward)    Start     Dose/Rate Route Frequency Ordered Stop   04/02/21 1200  Vancomycin (VANCOCIN)  1,250 mg in sodium chloride 0.9 % 250 mL IVPB  Status:  Discontinued        1,250 mg 166.7 mL/hr over 90 Minutes Intravenous Every 48 hours 03/31/21 1502 04/01/21 0756   04/02/21 1000  vancomycin (VANCOREADY) IVPB 1250 mg/250 mL        1,250 mg 166.7 mL/hr over 90 Minutes Intravenous Every 48 hours 04/01/21 1015     04/01/21 1400  metroNIDAZOLE (FLAGYL) IVPB 500 mg        500 mg 100 mL/hr over 60 Minutes Intravenous Every 8 hours 04/01/21 1341     04/01/21 1100  ceFEPIme (MAXIPIME) 2 g in sodium chloride 0.9 % 100 mL IVPB        2 g 200 mL/hr over 30 Minutes Intravenous Every 12 hours 04/01/21 1014     04/01/21 1000  ceFEPIme (MAXIPIME) 2 g in sodium chloride 0.9 % 100 mL IVPB  Status:  Discontinued        2 g 200 mL/hr over 30 Minutes Intravenous Every 24 hours 03/31/21 1502 04/01/21 0756   03/31/21 2230  vancomycin (VANCOCIN) capsule 125 mg  Status:  Discontinued        125 mg Oral 4 times daily 03/31/21 2136 03/31/21 2141   03/31/21 2230  vancomycin (VANCOCIN) capsule 125 mg  Status:  Discontinued        125 mg Oral 4 times daily 03/31/21 2136 03/31/21 2140   03/31/21 2230  vancomycin (VANCOCIN) capsule 125 mg        125 mg Oral 4 times daily 03/31/21 2141 04/10/21 2159   03/31/21 1615  vancomycin (VANCOREADY) IVPB 500 mg/100 mL        500 mg 100 mL/hr over 60 Minutes Intravenous  Once 03/31/21 1525 03/31/21 1653   03/31/21 1600  Vancomycin (VANCOCIN) 1,500 mg in sodium chloride 0.9 % 500 mL IVPB  Status:  Discontinued        1,500 mg 250 mL/hr over 120 Minutes Intravenous  Once 03/31/21 1502 03/31/21 1503   03/31/21 1545  vancomycin (VANCOCIN) 500 mg in sodium chloride 0.9 % 100 mL IVPB  Status:  Discontinued        500 mg 100 mL/hr over 60 Minutes Intravenous  Once 03/31/21 1456 03/31/21 1525   03/31/21 1100  ceFEPIme (MAXIPIME) 2 g in sodium chloride 0.9 % 100 mL IVPB        2 g 200 mL/hr over 30 Minutes Intravenous  Once 03/31/21 1053 03/31/21 1134   03/31/21 1100  vancomycin  (VANCOCIN) IVPB 1000 mg/200 mL premix        1,000 mg 200 mL/hr over 60 Minutes Intravenous  Once 03/31/21 1053 03/31/21 1222      Subjective: Today, patient was seen and examined at bedside.  Patient states that she feels hungry.  Denies any dizziness nausea vomiting.  Was mildly sleepy this morning.  Objective: Vitals:   04/02/21 0500 04/02/21 0600  BP: (!) 162/90 (!) 175/96  Pulse: 93 97  Resp: (!) 21 (!) 21  Temp: 99.5 F (37.5 C) 99.7 F (37.6 C)  SpO2: 97% 96%    Intake/Output Summary (Last 24 hours) at 04/02/2021 2409 Last data filed at 04/02/2021 0400 Gross per 24 hour  Intake  3776.15 ml  Output 2325 ml  Net 1451.15 ml   Filed Weights   03/31/21 1433 04/01/21 0500 04/02/21 0500  Weight: 62.4 kg 64.5 kg 66.6 kg   Body mass index is 26.01 kg/m.   Physical Exam: GENERAL: Patient is alert awake and oriented, HENT: No scleral pallor or icterus. Pupils equally reactive to light. Oral mucosa is moist, periorbital edema noted NECK: is supple, no gross swelling noted. CHEST: Clear to auscultation. No crackles or wheezes.  Diminished breath sounds bilaterally. CVS: S1 and S2 heard, no murmur. Regular rate and rhythm.  ABDOMEN: Soft, non-tender, bowel sounds are present. EXTREMITIES: No edema. CNS: Cranial nerves are intact. No focal motor deficits. SKIN: warm and dry without rashes.  Data Review: I have personally reviewed the following laboratory data and studies,  CBC: Recent Labs  Lab 03/31/21 1018 04/01/21 0022 04/01/21 1235 04/01/21 2016 04/02/21 0250  WBC 57.8* 48.1* 40.4* 35.5* 28.4*  HGB 7.8* 6.5* 6.6* 9.2* 8.4*  HCT 29.5* 18.8* 19.9* 26.5* 24.4*  MCV 100.3* 78.3* 77.7* 79.6* 79.5*  PLT 459* 342 347 313 093   Basic Metabolic Panel: Recent Labs  Lab 04/01/21 0022 04/01/21 0334 04/01/21 0756 04/01/21 1235 04/01/21 1712 04/01/21 2016 04/02/21 0250  NA 126*   < > 130* 129* 131* 133* 133*  K 3.3*   < > 3.2* 3.8 3.8 3.6 3.6  CL 101   < > 106 105  108 110 110  CO2 14*   < > 16* 17* 17* 16* 17*  GLUCOSE 681*   < > 151* 144* 148* 184* 130*  BUN 35*   < > 31* 30* 29* 28* 23*  CREATININE 2.43*   < > 2.29* 2.19* 2.20* 2.08* 1.94*  CALCIUM 7.3*   < > 7.5* 7.3* 7.4* 7.7* 7.3*  MG 1.5*  --   --   --   --  2.3 2.1  PHOS 2.0*  --   --   --   --  1.8* 1.8*   < > = values in this interval not displayed.   Liver Function Tests: Recent Labs  Lab 03/31/21 1018 04/01/21 2016 04/02/21 0250  AST 20 24 20   ALT 19 16 16   ALKPHOS 169* 109 108  BILITOT 1.3* 0.6 0.5  PROT 5.5* 4.6* 4.1*  ALBUMIN 2.3* 1.6* <1.5*   Recent Labs  Lab 03/31/21 1551  LIPASE 25  AMYLASE 11*   No results for input(s): AMMONIA in the last 168 hours. Cardiac Enzymes: No results for input(s): CKTOTAL, CKMB, CKMBINDEX, TROPONINI in the last 168 hours. BNP (last 3 results) No results for input(s): BNP in the last 8760 hours.  ProBNP (last 3 results) No results for input(s): PROBNP in the last 8760 hours.  CBG: Recent Labs  Lab 04/02/21 0124 04/02/21 0241 04/02/21 0349 04/02/21 0501 04/02/21 0731  GLUCAP 123* 124* 164* 151* 121*   Recent Results (from the past 240 hour(s))  Resp Panel by RT-PCR (Flu A&B, Covid) Nasopharyngeal Swab     Status: None   Collection Time: 03/31/21 10:45 AM   Specimen: Nasopharyngeal Swab; Nasopharyngeal(NP) swabs in vial transport medium  Result Value Ref Range Status   SARS Coronavirus 2 by RT PCR NEGATIVE NEGATIVE Final    Comment: (NOTE) SARS-CoV-2 target nucleic acids are NOT DETECTED.  The SARS-CoV-2 RNA is generally detectable in upper respiratory specimens during the acute phase of infection. The lowest concentration of SARS-CoV-2 viral copies this assay can detect is 138 copies/mL. A negative result does not preclude  SARS-Cov-2 infection and should not be used as the sole basis for treatment or other patient management decisions. A negative result may occur with  improper specimen collection/handling, submission of  specimen other than nasopharyngeal swab, presence of viral mutation(s) within the areas targeted by this assay, and inadequate number of viral copies(<138 copies/mL). A negative result must be combined with clinical observations, patient history, and epidemiological information. The expected result is Negative.  Fact Sheet for Patients:  EntrepreneurPulse.com.au  Fact Sheet for Healthcare Providers:  IncredibleEmployment.be  This test is no t yet approved or cleared by the [place] FDA and  has been authorized for detection and/or diagnosis of SARS-CoV-2 by FDA under an Emergency Use Authorization (EUA). This EUA will remain  in effect (meaning this test can be used) for the duration of the COVID-19 declaration under Section 564(b)(1) of the Act, 21 U.S.C.section 360bbb-3(b)(1), unless the authorization is terminated  or revoked sooner.       Influenza A by PCR NEGATIVE NEGATIVE Final   Influenza B by PCR NEGATIVE NEGATIVE Final    Comment: (NOTE) The Xpert Xpress SARS-CoV-2/FLU/RSV plus assay is intended as an aid in the diagnosis of influenza from Nasopharyngeal swab specimens and should not be used as a sole basis for treatment. Nasal washings and aspirates are unacceptable for Xpert Xpress SARS-CoV-2/FLU/RSV testing.  Fact Sheet for Patients: EntrepreneurPulse.com.au  Fact Sheet for Healthcare Providers: IncredibleEmployment.be  This test is not yet approved or cleared by the [place] FDA and has been authorized for detection and/or diagnosis of SARS-CoV-2 by FDA under an Emergency Use Authorization (EUA). This EUA will remain in effect (meaning this test can be used) for the duration of the COVID-19 declaration under Section 564(b)(1) of the Act, 21 U.S.C. section 360bbb-3(b)(1), unless the authorization is terminated or revoked.  Performed at [facility], [place]  [address], [place], [place] [address]   Blood culture (routine x 2)     Status: Abnormal (Preliminary result)   Collection Time: 03/31/21 10:47 AM   Specimen: BLOOD  Result Value Ref Range Status   Specimen Description   Final    BLOOD RIGHT ARM Performed at [facility] [address], [place], [place] [address]    Special Requests   Final    BOTTLES DRAWN AEROBIC AND ANAEROBIC Blood Culture adequate volume Performed at [facility] [place] [address], [place], [place] [address]    Culture  Setup Time (A)  Final    GRAM VARIABLE ROD AEROBIC BOTTLE ONLY CRITICAL RESULT CALLED TO, READ BACK BY AND VERIFIED WITH: PHARMD D.WOFFORD AT 0930 ON 04/01/2021 BY T.SAAD. Performed at [facility], [place] [address] [place], [place] [address]    Culture GRAM VARIABLE ROD (A)  Final   Report Status PENDING  Incomplete  Blood Culture ID Panel (Reflexed)     Status: None   Collection Time: 03/31/21 10:47 AM  Result Value Ref Range Status   Enterococcus faecalis NOT DETECTED NOT DETECTED Final   Enterococcus Faecium NOT DETECTED NOT DETECTED Final   Listeria monocytogenes NOT DETECTED NOT DETECTED Final   Staphylococcus species NOT DETECTED NOT DETECTED Final   Staphylococcus aureus (BCID) NOT DETECTED NOT DETECTED Final   Staphylococcus epidermidis NOT DETECTED NOT DETECTED Final   Staphylococcus lugdunensis NOT DETECTED NOT DETECTED Final   Streptococcus species NOT DETECTED NOT DETECTED Final   Streptococcus agalactiae NOT DETECTED NOT DETECTED Final   Streptococcus pneumoniae NOT DETECTED NOT DETECTED Final  Streptococcus pyogenes NOT DETECTED NOT DETECTED Final   A.calcoaceticus-baumannii NOT DETECTED NOT DETECTED Final   Bacteroides fragilis NOT DETECTED NOT DETECTED Final   Enterobacterales NOT DETECTED NOT DETECTED Final   Enterobacter cloacae complex NOT DETECTED NOT DETECTED Final   Escherichia coli NOT DETECTED NOT DETECTED Final    Klebsiella aerogenes NOT DETECTED NOT DETECTED Final   Klebsiella oxytoca NOT DETECTED NOT DETECTED Final   Klebsiella pneumoniae NOT DETECTED NOT DETECTED Final   Proteus species NOT DETECTED NOT DETECTED Final   Salmonella species NOT DETECTED NOT DETECTED Final   Serratia marcescens NOT DETECTED NOT DETECTED Final   Haemophilus influenzae NOT DETECTED NOT DETECTED Final   Neisseria meningitidis NOT DETECTED NOT DETECTED Final   Pseudomonas aeruginosa NOT DETECTED NOT DETECTED Final   Stenotrophomonas maltophilia NOT DETECTED NOT DETECTED Final   Candida albicans NOT DETECTED NOT DETECTED Final   Candida auris NOT DETECTED NOT DETECTED Final   Candida glabrata NOT DETECTED NOT DETECTED Final   Candida krusei NOT DETECTED NOT DETECTED Final   Candida parapsilosis NOT DETECTED NOT DETECTED Final   Candida tropicalis NOT DETECTED NOT DETECTED Final   Cryptococcus neoformans/gattii NOT DETECTED NOT DETECTED Final    Comment: Performed at [facility], [address] [place], [place] [address]  Blood culture (routine x 2)     Status: None (Preliminary result)   Collection Time: 03/31/21 10:55 AM   Specimen: BLOOD  Result Value Ref Range Status   Specimen Description   Final    BLOOD LEFT ARM Performed at [facility] [address], [place], [place] [address]    Special Requests   Final    BOTTLES DRAWN AEROBIC AND ANAEROBIC Blood Culture results may not be optimal due to an inadequate volume of blood received in culture bottles Performed at [facility] [place] [address], [place], [place] [address]    Culture   Final    NO GROWTH < 24 HOURS Performed at [facility] [address], [place], [place] [address]    Report Status PENDING  Incomplete  C Difficile Quick Screen w PCR reflex     Status: Abnormal   Collection Time: 03/31/21  5:08 PM   Specimen: STOOL  Result Value Ref Range Status   C Diff antigen POSITIVE (A) NEGATIVE  Final   C Diff toxin NEGATIVE NEGATIVE Final   C Diff interpretation Results are indeterminate. See PCR results.  Final    Comment: Performed at [facility], [place] [address], [place], [place] [address]  C. Diff by PCR, Reflexed     Status: Abnormal   Collection Time: 03/31/21  5:08 PM  Result Value Ref Range Status   Toxigenic C. Difficile by PCR POSITIVE (A) NEGATIVE Final    Comment: Positive for toxigenic C. difficile with little to no toxin production. Only treat if clinical presentation suggests symptomatic illness. Performed at [facility], [place] [address] [place], [place] [address]   MRSA Next Gen by PCR, Nasal     Status: None   Collection Time: 04/01/21 11:10 AM   Specimen: Nasal Mucosa; Nasal Swab  Result Value Ref Range Status   MRSA by PCR Next Gen NOT DETECTED NOT DETECTED Final    Comment: (NOTE) The GeneXpert MRSA Assay (FDA approved for NASAL specimens only), is one component of a comprehensive MRSA colonization surveillance program. It is not intended to diagnose MRSA infection nor to guide or monitor treatment  for MRSA infections. Test performance is not FDA approved in patients less than 62 years old. Performed at [facility], [place] [address], [place], [place] [address]      Studies: DG Chest Port 1 View  Result Date: 04/01/2021 CLINICAL DATA:  DKA. Recent COVID. Left lower lobe atelectasis versus infiltrate. EXAM: PORTABLE CHEST 1 VIEW COMPARISON:  03/31/2021 FINDINGS: Increased hazy densities in the left lower lung. Slightly increased interstitial densities in both lungs. Heart size is within normal limits and stable. Negative for a pneumothorax. IMPRESSION: 1. Hazy densities in left lower chest. Findings could represent pleural fluid or atelectasis. 2. Concern for mild interstitial pulmonary edema. Electronically Signed   By: Markus Daft M.D.   On: 04/01/2021 09:03   DG CHEST PORT 1 VIEW  Result Date:  03/31/2021 CLINICAL DATA:  Shortness of breath EXAM: PORTABLE CHEST 1 VIEW COMPARISON:  03/31/2021 FINDINGS: The heart size and mediastinal contours are within normal limits. Both lungs are clear. The visualized skeletal structures are unremarkable. IMPRESSION: No active disease. Electronically Signed   By: Jerilynn Mages.  Shick M.D.   On: 03/31/2021 15:49   DG Chest Portable 1 View  Result Date: 03/31/2021 CLINICAL DATA:  Diabetic ketoacidosis EXAM: PORTABLE CHEST 1 VIEW COMPARISON:  Prior chest x-ray 03/20/2021 FINDINGS: Low inspiratory volumes. Streaky opacities in the left lower lobe favored to reflect atelectasis. Cardiac and mediastinal contours are normal. No pleural effusion or pneumothorax. No acute osseous abnormality. IMPRESSION: Streaky airspace opacities in the left lower lobe favored to reflect atelectasis. Early bronchopneumonia is difficult to exclude entirely. Recommend dedicated PA and lateral chest x-ray when the patient is able. Electronically Signed   By: Jacqulynn Cadet M.D.   On: 03/31/2021 11:11   DG Abd Portable 1V  Result Date: 04/01/2021 CLINICAL DATA:  C difficile colitis EXAM: PORTABLE ABDOMEN - 1 VIEW COMPARISON:  2013 FINDINGS: The bowel gas pattern is normal. No radio-opaque calculi or other significant radiographic abnormality are seen. IMPRESSION: Negative. Electronically Signed   By: Macy Mis M.D.   On: 04/01/2021 10:02   ECHOCARDIOGRAM COMPLETE  Result Date: 04/01/2021    ECHOCARDIOGRAM REPORT   Patient Name:   RUKAYA KLEINSCHMIDT [facility] Date of Exam: 04/01/2021 Medical Rec #:  [id number]                    Height:       63.0 in Accession #:    [id number]                   Weight:       142.2 lb Date of Birth:  [birth year]                     BSA:          1.673 m Patient Age:    30 years                     BP:           140/59 mmHg Patient Gender: F                            HR:           104 bpm. Exam Location:  Inpatient Procedure: 2D Echo, Cardiac Doppler and Color  Doppler Indications:    Cardiomyopathy-Unspecified I42.9  History:        Patient has prior history of Echocardiogram  examinations, most                 recent 12/08/2020. Risk Factors:Diabetes and Dyslipidemia.  Sonographer:    Bernadene Person RDCS Referring Phys: [place]  1. Left ventricular ejection fraction, by estimation, is 65 to 70%. The left ventricle has normal function. The left ventricle has no regional wall motion abnormalities. Left ventricular diastolic parameters were normal.  2. Right ventricular systolic function is normal. The right ventricular size is normal. There is mildly elevated pulmonary artery systolic pressure.  3. The mitral valve is normal in structure. Trivial mitral valve regurgitation.  4. The aortic valve is normal in structure. Aortic valve regurgitation is not visualized. No aortic stenosis is present. FINDINGS  Left Ventricle: Left ventricular ejection fraction, by estimation, is 65 to 70%. The left ventricle has normal function. The left ventricle has no regional wall motion abnormalities. The left ventricular internal cavity size was small. There is no left ventricular hypertrophy. Left ventricular diastolic parameters were normal. Right Ventricle: The right ventricular size is normal. Right vetricular wall thickness was not well visualized. Right ventricular systolic function is normal. There is mildly elevated pulmonary artery systolic pressure. The tricuspid regurgitant velocity  is 2.94 m/s, and with an assumed right atrial pressure of 3 mmHg, the estimated right ventricular systolic pressure is 73.2 mmHg. Left Atrium: Left atrial size was normal in size. Right Atrium: Right atrial size was normal in size. Pericardium: There is no evidence of pericardial effusion. Mitral Valve: The mitral valve is normal in structure. Trivial mitral valve regurgitation. Tricuspid Valve: The tricuspid valve is normal in structure. Tricuspid valve regurgitation is mild.  Aortic Valve: The aortic valve is normal in structure. Aortic valve regurgitation is not visualized. No aortic stenosis is present. Pulmonic Valve: The pulmonic valve was grossly normal. Pulmonic valve regurgitation is trivial. Aorta: The aortic root and ascending aorta are structurally normal, with no evidence of dilitation. IAS/Shunts: The interatrial septum was not well visualized.  LEFT VENTRICLE PLAX 2D LVIDd:         3.80 cm     Diastology LVIDs:         2.70 cm     LV e' medial:    8.43 cm/s LV PW:         0.80 cm     LV E/e' medial:  12.6 LV IVS:        0.70 cm     LV e' lateral:   10.30 cm/s LVOT diam:     1.80 cm     LV E/e' lateral: 10.3 LV SV:         42 LV SV Index:   25 LVOT Area:     2.54 cm  LV Volumes (MOD) LV vol d, MOD A2C: 67.6 ml LV vol d, MOD A4C: 77.5 ml LV vol s, MOD A2C: 29.6 ml LV vol s, MOD A4C: 32.4 ml LV SV MOD A2C:     38.0 ml LV SV MOD A4C:     77.5 ml LV SV MOD BP:      42.6 ml RIGHT VENTRICLE RV S prime:     13.60 cm/s TAPSE (M-mode): 2.4 cm LEFT ATRIUM             Index        RIGHT ATRIUM          Index LA diam:        1.80 cm 1.08 cm/m   RA Area:  9.32 cm LA Vol (A2C):   19.6 ml 11.72 ml/m  RA Volume:   17.50 ml 10.46 ml/m LA Vol (A4C):   21.6 ml 12.91 ml/m LA Biplane Vol: 21.0 ml 12.55 ml/m  AORTIC VALVE LVOT Vmax:   109.00 cm/s LVOT Vmean:  74.900 cm/s LVOT VTI:    0.166 m  AORTA Ao Root diam: 2.60 cm Ao Asc diam:  2.70 cm MITRAL VALVE                TRICUSPID VALVE MV Area (PHT): 5.62 cm     TR Peak grad:   34.6 mmHg MV Decel Time: 135 msec     TR Vmax:        294.00 cm/s MV E velocity: 106.00 cm/s MV A velocity: 104.00 cm/s  SHUNTS MV E/A ratio:  1.02         Systemic VTI:  0.17 m                             Systemic Diam: 1.80 cm Mertie Moores MD Electronically signed by Mertie Moores MD Signature Date/Time: 04/01/2021/4:21:52 PM    Final       Flora Lipps, MD  [facility] 04/02/2021  If 7PM-7AM, please contact night-coverage

## 2021-04-02 NOTE — Progress Notes (Signed)
eLink Physician-Brief Progress Note Patient Name: Ashley Freeman DOB: 1990-11-21 MRN: [id number]   Date of Service  04/02/2021  HPI/Events of Note  On endo tool but tool not suggesting to go to [facility]SI at this time but CBG's reported by RN has ran 120-164, was wondering if pt could transition off, no beta ordered this AM, please look at AM labs, calcium and albumin are low:  Corrected calcium for low albumin is normal. So no need for replacement.  Co2 still low at 17.   eICU Interventions  Continue care.      Intervention Category Intermediate Interventions: Hyperglycemia - evaluation and treatment  Elmer Sow 04/02/2021, 4:42 AM

## 2021-04-02 NOTE — Progress Notes (Signed)
Patient did not refuse PO vancomycin overnight

## 2021-04-02 NOTE — Progress Notes (Signed)
eLink Physician-Brief Progress Note Patient Name: Ashley Freeman DOB: 06/08/1990 MRN: [id number]   Date of Service  04/02/2021  HPI/Events of Note  AM labs with Phos of 1.8, Adm with Cr 2.20, today Cr. 1.94,   eICU Interventions  Kphos 15 mmols once ordered. K at 3.6. DKA resolving.      Intervention Category Intermediate Interventions: Electrolyte abnormality - evaluation and management  Elmer Sow 04/02/2021, 5:32 AM

## 2021-04-02 NOTE — Progress Notes (Signed)
Inpatient Diabetes Program Recommendations  AACE/ADA: New Consensus Statement on Inpatient Glycemic Control (2015)  Target Ranges:  Prepandial:   less than 140 mg/dL      Peak postprandial:   less than 180 mg/dL (1-2 hours)      Critically ill patients:  140 - 180 mg/dL   Lab Results  Component Value Date   GLUCAP 160 (H) 04/02/2021   HGBA1C 10.5 (H) 03/07/2021    Review of Glycemic Control  Diabetes history: DM1 Outpatient Diabetes medications: Insulin pump Current orders for Inpatient glycemic control: Semglee 15 QD, Novolog 0-15 units TID with meals and 0-5 HS + 3 units TID  HgbA1C - 10.5%  Inpatient Diabetes Program Recommendations:    Consider decreasing Novolog to 0-9 units TID with meals and 0-5 HS, since pt is Type 1 and very sensitive to insulin.  Will f/u with pt in am.   Thank you. Lorenda Peck, RD, LDN, CDE Inpatient Diabetes Coordinator [phone]

## 2021-04-02 NOTE — Progress Notes (Signed)
    D/w Pharmacy Given C Diff dx - > dc cefepime and IV vanc Just Rx for C diff    SIGNATURE    Dr. Brand Males, M.D., F.C.C.P,  Pulmonary and Critical Care Medicine Staff Physician, [place] Director - Interstitial Lung Disease  Program  Pulmonary [place] at [place], [place], [address]  NPI Number:  NPI #[id number]  Pager: [phone], If no answer  -> Check AMION or Try [place] Telephone (clinical office): [phone] Telephone (research): [phone]  10:20 AM 04/02/2021

## 2021-04-03 DIAGNOSIS — E1110 Type 2 diabetes mellitus with ketoacidosis without coma: Secondary | ICD-10-CM | POA: Diagnosis not present

## 2021-04-03 DIAGNOSIS — A498 Other bacterial infections of unspecified site: Secondary | ICD-10-CM | POA: Diagnosis not present

## 2021-04-03 LAB — COMPREHENSIVE METABOLIC PANEL
ALT: 17 U/L (ref 0–44)
ALT: 17 U/L (ref 0–44)
AST: 28 U/L (ref 15–41)
AST: 23 U/L (ref 15–41)
Albumin: 1.6 g/dL — ABNORMAL LOW (ref 3.5–5.0)
Albumin: 1.7 g/dL — ABNORMAL LOW (ref 3.5–5.0)
Alkaline Phosphatase: 111 U/L (ref 38–126)
Alkaline Phosphatase: 119 U/L (ref 38–126)
Anion gap: 6 (ref 5–15)
Anion gap: 5 (ref 5–15)
BUN: 21 mg/dL — ABNORMAL HIGH (ref 6–20)
BUN: 20 mg/dL (ref 6–20)
CO2: 18 mmol/L — ABNORMAL LOW (ref 22–32)
CO2: 20 mmol/L — ABNORMAL LOW (ref 22–32)
Calcium: 7.5 mg/dL — ABNORMAL LOW (ref 8.9–10.3)
Calcium: 7.7 mg/dL — ABNORMAL LOW (ref 8.9–10.3)
Chloride: 110 mmol/L (ref 98–111)
Chloride: 109 mmol/L (ref 98–111)
Creatinine, Ser: 1.71 mg/dL — ABNORMAL HIGH (ref 0.44–1.00)
Creatinine, Ser: 1.58 mg/dL — ABNORMAL HIGH (ref 0.44–1.00)
GFR, Estimated: 41 mL/min — ABNORMAL LOW (ref >60)
GFR, Estimated: 45 mL/min — ABNORMAL LOW (ref >60)
Glucose, Bld: 117 mg/dL — ABNORMAL HIGH (ref 70–99)
Glucose, Bld: 176 mg/dL — ABNORMAL HIGH (ref 70–99)
Potassium: 3.5 mmol/L (ref 3.5–5.1)
Potassium: 3.4 mmol/L — ABNORMAL LOW (ref 3.5–5.1)
Sodium: 134 mmol/L — ABNORMAL LOW (ref 135–145)
Sodium: 134 mmol/L — ABNORMAL LOW (ref 135–145)
Total Bilirubin: 0.5 mg/dL (ref 0.3–1.2)
Total Bilirubin: 0.4 mg/dL (ref 0.3–1.2)
Total Protein: 4.7 g/dL — ABNORMAL LOW (ref 6.5–8.1)
Total Protein: 4.9 g/dL — ABNORMAL LOW (ref 6.5–8.1)

## 2021-04-03 LAB — CBC
HCT: 25.9 % — ABNORMAL LOW (ref 36.0–46.0)
HCT: 28 % — ABNORMAL LOW (ref 36.0–46.0)
Hemoglobin: 8.8 g/dL — ABNORMAL LOW (ref 12.0–15.0)
Hemoglobin: 9.3 g/dL — ABNORMAL LOW (ref 12.0–15.0)
MCH: 27.1 pg (ref 26.0–34.0)
MCH: 27.4 pg (ref 26.0–34.0)
MCHC: 33.2 g/dL (ref 30.0–36.0)
MCHC: 34 g/dL (ref 30.0–36.0)
MCV: 80.7 fL (ref 80.0–100.0)
MCV: 81.6 fL (ref 80.0–100.0)
Platelets: 285 10*3/uL (ref 150–400)
Platelets: 310 10*3/uL (ref 150–400)
RBC: 3.21 MIL/uL — ABNORMAL LOW (ref 3.87–5.11)
RBC: 3.43 MIL/uL — ABNORMAL LOW (ref 3.87–5.11)
RDW: 15 % (ref 11.5–15.5)
RDW: 15 % (ref 11.5–15.5)
WBC: 20.3 10*3/uL — ABNORMAL HIGH (ref 4.0–10.5)
WBC: 23.2 10*3/uL — ABNORMAL HIGH (ref 4.0–10.5)
nRBC: 0 % (ref 0.0–0.2)
nRBC: 0 % (ref 0.0–0.2)

## 2021-04-03 LAB — LEGIONELLA PNEUMOPHILA SEROGP 1 UR AG
L. pneumophila Serogp 1 Ur Ag: NEGATIVE
L. pneumophila Serogp 1 Ur Ag: NEGATIVE

## 2021-04-03 LAB — GLUCOSE, CAPILLARY
Glucose-Capillary: 167 mg/dL — ABNORMAL HIGH (ref 70–99)
Glucose-Capillary: 135 mg/dL — ABNORMAL HIGH (ref 70–99)
Glucose-Capillary: 42 mg/dL — CL (ref 70–99)
Glucose-Capillary: 59 mg/dL — ABNORMAL LOW (ref 70–99)
Glucose-Capillary: 120 mg/dL — ABNORMAL HIGH (ref 70–99)

## 2021-04-03 LAB — CULTURE, BLOOD (ROUTINE X 2): Special Requests: ADEQUATE

## 2021-04-03 LAB — TYPE AND SCREEN
ABO/RH(D): A POS
Antibody Screen: NEGATIVE
Unit division: 0

## 2021-04-03 LAB — BPAM RBC
Blood Product Expiration Date: 202212232359
ISSUE DATE / TIME: 202212031517
Unit Type and Rh: 6200

## 2021-04-03 LAB — MAGNESIUM: Magnesium: 1.8 mg/dL (ref 1.7–2.4)

## 2021-04-03 LAB — TROPONIN I (HIGH SENSITIVITY): Troponin I (High Sensitivity): 7 ng/L (ref <18)

## 2021-04-03 LAB — PHOSPHORUS: Phosphorus: 1.9 mg/dL — ABNORMAL LOW (ref 2.5–4.6)

## 2021-04-03 MED ORDER — TRAMADOL HCL 50 MG PO TABS
50.0000 mg | ORAL_TABLET | Freq: Once | ORAL | Status: AC
  Administered 2021-04-03: 50 mg via ORAL
  Filled 2021-04-03: qty 1

## 2021-04-03 MED ORDER — MORPHINE SULFATE (PF) 2 MG/ML IV SOLN
1.0000 mg | Freq: Once | INTRAVENOUS | Status: AC
  Administered 2021-04-03: 1 mg via INTRAVENOUS
  Filled 2021-04-03: qty 1

## 2021-04-03 MED ORDER — POTASSIUM PHOSPHATES 15 MMOLE/5ML IV SOLN
30.0000 mmol | Freq: Once | INTRAVENOUS | Status: AC
  Administered 2021-04-03: 30 mmol via INTRAVENOUS
  Filled 2021-04-03: qty 10

## 2021-04-03 NOTE — Progress Notes (Signed)
PT Cancellation Note  Patient Details Name: JACQUALYN SEDGWICK MRN: [id number] DOB: 09-Feb-1991   Cancelled Treatment:    Reason Eval/Treat Not Completed: Fatigue/lethargy limiting ability to participate;Pain limiting ability to participate   Claretha Cooper 04/03/2021, 4:03 PM Tresa Endo [place] Pager [phone] Office [phone]

## 2021-04-03 NOTE — Progress Notes (Addendum)
PROGRESS NOTE  Ashley Freeman TFT:[id number] DOB: 01-03-1991 DOA: 03/31/2021 PCP: [name], PA-C   LOS: 3 days   Brief narrative: 30 year old female with past medical history of type 1 diabetes with chronic kidney disease and neuropathy presented to hospital on 04/28/2021 with elevated blood glucose levels.  Of note, she was recently admitted at [facility] hospital from 11/21 through 11/27 with DKA in the setting of COVID-19 and influenza infection.  On this presentation patient was noted to be hypothermic, tachycardic with low blood pressure and some mental confusion.  Patient's mother reported that she had been noncompliant with home insulin regimen, was reportedly on insulin pump that has not been used.  Initial venous blood gas showed pH of 6.9 with sodium of 116 and potassium of 5.4.  Bicarb was less than 7 with glucose of 1330.  BUN is elevated with a creatinine of 2.7 and lactate elevated at 5.2.  Patient did have leukocytosis with WBC of 57.8 and INR of 1.6.  Repeat influenza and COVID screening test was negative.  Chest x-ray showed mild left lower streaky opacities thought to be secondary to atelectasis versus early pneumonia.  Also had some diarrhea in the ED.  Patient was given IV fluid resuscitation, IV insulin and was admitted to the hospital.  During hospitalization, patient was treated with IV insulin for DKA and has been started on p.o. vancomycin for C. difficile diarrhea.  Has been empirically on antibiotics for possible sepsis.  Patient was subsequently considered stable for transfer out of the ICU to medical service on 04/02/2021.   Assessment/Plan:  Active Problems:   DKA (diabetic ketoacidosis) (HCC)  Severe diabetic ketoacidosis.  Likely secondary to noncompliance with possible infection.   Patient has been transitioned to long-acting sliding scale and mealtime insulin.  Diabetic coordinator on board.  Latest hemoglobin A1c of 10.5.  Resume insulin  pump at home. The patient's mother is concerned that her insulin pump might not need be administering the required, will consult diabetic coordinator.   Acute metabolic encephalopathy secondary to DKA.   Improved.  Urine drug screen was negative.  TSH 3.6.    Severe volume depletion secondary to DKA and diarrhea.  Improved.  Received IV fluids  Bacteremia with Gram variable rods.  Off IV antibiotic.  Urine culture was negative.  Sepsis possibly secondary to C. difficile diarrhea.  Antigen positive but toxin negative.  Very high WBC count with foul-smelling diarrhea.  Has been empirically started on oral vancomycin for high suspicion of C. difficile.  On IV Flagyl and p.o. Vanco since she was refusing p.o. Vanco.  Spoke with the patient about the importance of taking p.o. Vanco.  Off rectal tube at this time  CBC Latest Ref Rng & Units 04/03/2021 04/03/2021 04/02/2021  WBC 4.0 - 10.5 K/uL 20.3(H) 23.2(H) 28.4(H)  Hemoglobin 12.0 - 15.0 g/dL 9.3(L) 8.8(L) 8.4(L)  Hematocrit 36.0 - 46.0 % 28.0(L) 25.9(L) 24.4(L)  Platelets 150 - 400 K/uL 310 285 309     Significant acute on chronic anemia.  Patient received 1 unit of packed RBC for hemoglobin of 6.6.  Hemoglobin of 8.8.  Significant leukocytosis.  Leukocytosis trending down.  WBC at 23.2.  Secondary to sepsis, C. difficile infection.  Continue to monitor CBC.  Hyponatremia. Mild at this time.  We will continue to monitor closely.  Latest sodium of 134.  Hypophosphatemia.    Has received IV K-Phos on 04/02/2021.  Repeat phosphate level today at 1.9.  We will replenish with  K-Phos again.  Check levels in a.m.  Hypokalemia.  Mild.  Will be receiving K-Phos today.  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  Secondary to diabetes.  Continue with IV fluids.  Creatinine today at 1.7 from 1.9  Severe protein calorie malnutrition.  Present on admission.  Encourage oral nutrition.  Debility, deconditioning.  We will get PT evaluation.  Ambulate the patient as  able.  DVT prophylaxis: heparin injection 5,000 Units Start: 03/31/21 1430 SCDs Start: 03/31/21 1419  Disposition:  Home likely in 1 to 2 days.  Continue to monitor.  We will ambulate the patient.  Also obtain physical therapy evaluation.  Code Status: Full code  Family Communication:  Spoke with the patient's mother on the phone and updated her about the clinical condition of the patient.  Patient's mother states that her daughter doesn't really share her medical issues with her.  Status is: Inpatient  Remains inpatient appropriate because: DKA, electrolyte imbalance, C. difficile infection,  Consultants: PCCM  Procedures: None  Anti-infectives:  oral vancomycin  Anti-infectives (From admission, onward)    Start     Dose/Rate Route Frequency Ordered Stop   04/02/21 1200  Vancomycin (VANCOCIN) 1,250 mg in sodium chloride 0.9 % 250 mL IVPB  Status:  Discontinued        1,250 mg 166.7 mL/hr over 90 Minutes Intravenous Every 48 hours 03/31/21 1502 04/01/21 0756   04/02/21 1000  vancomycin (VANCOREADY) IVPB 1250 mg/250 mL  Status:  Discontinued        1,250 mg 166.7 mL/hr over 90 Minutes Intravenous Every 48 hours 04/01/21 1015 04/02/21 1019   04/01/21 1400  metroNIDAZOLE (FLAGYL) IVPB 500 mg        500 mg 100 mL/hr over 60 Minutes Intravenous Every 8 hours 04/01/21 1341     04/01/21 1100  ceFEPIme (MAXIPIME) 2 g in sodium chloride 0.9 % 100 mL IVPB  Status:  Discontinued        2 g 200 mL/hr over 30 Minutes Intravenous Every 12 hours 04/01/21 1014 04/02/21 1019   04/01/21 1000  ceFEPIme (MAXIPIME) 2 g in sodium chloride 0.9 % 100 mL IVPB  Status:  Discontinued        2 g 200 mL/hr over 30 Minutes Intravenous Every 24 hours 03/31/21 1502 04/01/21 0756   03/31/21 2230  vancomycin (VANCOCIN) capsule 125 mg  Status:  Discontinued        125 mg Oral 4 times daily 03/31/21 2136 03/31/21 2141   03/31/21 2230  vancomycin (VANCOCIN) capsule 125 mg  Status:  Discontinued        125  mg Oral 4 times daily 03/31/21 2136 03/31/21 2140   03/31/21 2230  vancomycin (VANCOCIN) capsule 125 mg        125 mg Oral 4 times daily 03/31/21 2141 04/10/21 2159   03/31/21 1615  vancomycin (VANCOREADY) IVPB 500 mg/100 mL        500 mg 100 mL/hr over 60 Minutes Intravenous  Once 03/31/21 1525 03/31/21 1653   03/31/21 1600  Vancomycin (VANCOCIN) 1,500 mg in sodium chloride 0.9 % 500 mL IVPB  Status:  Discontinued        1,500 mg 250 mL/hr over 120 Minutes Intravenous  Once 03/31/21 1502 03/31/21 1503   03/31/21 1545  vancomycin (VANCOCIN) 500 mg in sodium chloride 0.9 % 100 mL IVPB  Status:  Discontinued        500 mg 100 mL/hr over 60 Minutes Intravenous  Once 03/31/21 1456 03/31/21 1525  03/31/21 1100  ceFEPIme (MAXIPIME) 2 g in sodium chloride 0.9 % 100 mL IVPB        2 g 200 mL/hr over 30 Minutes Intravenous  Once 03/31/21 1053 03/31/21 1134   03/31/21 1100  vancomycin (VANCOCIN) IVPB 1000 mg/200 mL premix        1,000 mg 200 mL/hr over 60 Minutes Intravenous  Once 03/31/21 1053 03/31/21 1222      Subjective: Today, patient was seen and examined at bedside.  Complains of abdominal bloating, distention and to 2episodes of diarrhea.  Feels weak.  Denies any nausea vomiting or abdominal pain.  No fever or chills.  Objective: Vitals:   04/03/21 0242 04/03/21 0633  BP: (!) 147/96 111/85  Pulse: (!) 103 (!) 110  Resp: 20 18  Temp: 98.3 F (36.8 C) 98.7 F (37.1 C)  SpO2: 100% 100%    Intake/Output Summary (Last 24 hours) at 04/03/2021 1100 Last data filed at 04/03/2021 3662 Gross per 24 hour  Intake 2121.86 ml  Output 3250 ml  Net -1128.14 ml    Filed Weights   04/01/21 0500 04/02/21 0500 04/03/21 0639  Weight: 64.5 kg 66.6 kg 62.4 kg   Body mass index is 24.37 kg/m.   Physical Exam: GENERAL: Patient is alert awake and oriented, periorbital puffiness noted HENT: No scleral pallor or icterus. Pupils equally reactive to light. Oral mucosa is moist,  NECK: is supple,  no gross swelling noted. CHEST: Clear to auscultation. No crackles or wheezes.  Diminished breath sounds bilaterally. CVS: S1 and S2 heard, no murmur. Regular rate and rhythm.  ABDOMEN: Soft, non-tender, bowel sounds are present. EXTREMITIES: No edema. CNS: Cranial nerves are intact. No focal motor deficits. SKIN: warm and dry without rashes.  Data Review: I have personally reviewed the following laboratory data and studies,  CBC: Recent Labs  Lab 04/01/21 0022 04/01/21 1235 04/01/21 2016 04/02/21 0250 04/03/21 0017  WBC 48.1* 40.4* 35.5* 28.4* 23.2*  HGB 6.5* 6.6* 9.2* 8.4* 8.8*  HCT 18.8* 19.9* 26.5* 24.4* 25.9*  MCV 78.3* 77.7* 79.6* 79.5* 80.7  PLT 342 347 313 309 947    Basic Metabolic Panel: Recent Labs  Lab 04/01/21 0022 04/01/21 0334 04/01/21 1235 04/01/21 1712 04/01/21 2016 04/02/21 0250 04/03/21 0017  NA 126*   < > 129* 131* 133* 133* 134*  K 3.3*   < > 3.8 3.8 3.6 3.6 3.5  CL 101   < > 105 108 110 110 110  CO2 14*   < > 17* 17* 16* 17* 18*  GLUCOSE 681*   < > 144* 148* 184* 130* 117*  BUN 35*   < > 30* 29* 28* 23* 21*  CREATININE 2.43*   < > 2.19* 2.20* 2.08* 1.94* 1.71*  CALCIUM 7.3*   < > 7.3* 7.4* 7.7* 7.3* 7.5*  MG 1.5*  --   --   --  2.3 2.1  --   PHOS 2.0*  --   --   --  1.8* 1.8*  --    < > = values in this interval not displayed.    Liver Function Tests: Recent Labs  Lab 03/31/21 1018 04/01/21 2016 04/02/21 0250 04/03/21 0017  AST 20 24 20 28   ALT 19 16 16 17   ALKPHOS 169* 109 108 111  BILITOT 1.3* 0.6 0.5 0.5  PROT 5.5* 4.6* 4.1* 4.7*  ALBUMIN 2.3* 1.6* <1.5* 1.6*    Recent Labs  Lab 03/31/21 1551  LIPASE 25  AMYLASE 11*    No  results for input(s): AMMONIA in the last 168 hours. Cardiac Enzymes: No results for input(s): CKTOTAL, CKMB, CKMBINDEX, TROPONINI in the last 168 hours. BNP (last 3 results) No results for input(s): BNP in the last 8760 hours.  ProBNP (last 3 results) No results for input(s): PROBNP in the last  8760 hours.  CBG: Recent Labs  Lab 04/02/21 0930 04/02/21 1031 04/02/21 1646 04/02/21 2254 04/03/21 0738  GLUCAP 163* 160* 168* 83 167*    Recent Results (from the past 240 hour(s))  Resp Panel by RT-PCR (Flu A&B, Covid) Nasopharyngeal Swab     Status: None   Collection Time: 03/31/21 10:45 AM   Specimen: Nasopharyngeal Swab; Nasopharyngeal(NP) swabs in vial transport medium  Result Value Ref Range Status   SARS Coronavirus 2 by RT PCR NEGATIVE NEGATIVE Final    Comment: (NOTE) SARS-CoV-2 target nucleic acids are NOT DETECTED.  The SARS-CoV-2 RNA is generally detectable in upper respiratory specimens during the acute phase of infection. The lowest concentration of SARS-CoV-2 viral copies this assay can detect is 138 copies/mL. A negative result does not preclude SARS-Cov-2 infection and should not be used as the sole basis for treatment or other patient management decisions. A negative result may occur with  improper specimen collection/handling, submission of specimen other than nasopharyngeal swab, presence of viral mutation(s) within the areas targeted by this assay, and inadequate number of viral copies(<138 copies/mL). A negative result must be combined with clinical observations, patient history, and epidemiological information. The expected result is Negative.  Fact Sheet for Patients:  EntrepreneurPulse.com.au  Fact Sheet for Healthcare Providers:  IncredibleEmployment.be  This test is no t yet approved or cleared by the [place] FDA and  has been authorized for detection and/or diagnosis of SARS-CoV-2 by FDA under an Emergency Use Authorization (EUA). This EUA will remain  in effect (meaning this test can be used) for the duration of the COVID-19 declaration under Section 564(b)(1) of the Act, 21 U.S.C.section 360bbb-3(b)(1), unless the authorization is terminated  or revoked sooner.       Influenza A by PCR NEGATIVE  NEGATIVE Final   Influenza B by PCR NEGATIVE NEGATIVE Final    Comment: (NOTE) The Xpert Xpress SARS-CoV-2/FLU/RSV plus assay is intended as an aid in the diagnosis of influenza from Nasopharyngeal swab specimens and should not be used as a sole basis for treatment. Nasal washings and aspirates are unacceptable for Xpert Xpress SARS-CoV-2/FLU/RSV testing.  Fact Sheet for Patients: EntrepreneurPulse.com.au  Fact Sheet for Healthcare Providers: IncredibleEmployment.be  This test is not yet approved or cleared by the [place] FDA and has been authorized for detection and/or diagnosis of SARS-CoV-2 by FDA under an Emergency Use Authorization (EUA). This EUA will remain in effect (meaning this test can be used) for the duration of the COVID-19 declaration under Section 564(b)(1) of the Act, 21 U.S.C. section 360bbb-3(b)(1), unless the authorization is terminated or revoked.  Performed at [facility], [place] [address], [place], [place] [address]   Blood culture (routine x 2)     Status: Abnormal   Collection Time: 03/31/21 10:47 AM   Specimen: BLOOD  Result Value Ref Range Status   Specimen Description   Final    BLOOD RIGHT ARM Performed at [place] [address] [place], [place] [address]    Special Requests   Final    BOTTLES DRAWN AEROBIC AND ANAEROBIC Blood Culture adequate volume Performed at [place] [address], [place],  [address]  Culture  Setup Time (A)  Final    GRAM VARIABLE ROD AEROBIC BOTTLE ONLY CRITICAL RESULT CALLED TO, READ BACK BY AND VERIFIED WITH: PHARMD D.WOFFORD AT 0930 ON 04/01/2021 BY T.SAAD.    Culture (A)  Final    BACILLUS SPECIES Standardized susceptibility testing for this organism is not available. Performed at [facility], [place] [address] [place], [place] [address]    Report Status 04/03/2021 FINAL  Final  Blood  Culture ID Panel (Reflexed)     Status: None   Collection Time: 03/31/21 10:47 AM  Result Value Ref Range Status   Enterococcus faecalis NOT DETECTED NOT DETECTED Final   Enterococcus Faecium NOT DETECTED NOT DETECTED Final   Listeria monocytogenes NOT DETECTED NOT DETECTED Final   Staphylococcus species NOT DETECTED NOT DETECTED Final   Staphylococcus aureus (BCID) NOT DETECTED NOT DETECTED Final   Staphylococcus epidermidis NOT DETECTED NOT DETECTED Final   Staphylococcus lugdunensis NOT DETECTED NOT DETECTED Final   Streptococcus species NOT DETECTED NOT DETECTED Final   Streptococcus agalactiae NOT DETECTED NOT DETECTED Final   Streptococcus pneumoniae NOT DETECTED NOT DETECTED Final   Streptococcus pyogenes NOT DETECTED NOT DETECTED Final   A.calcoaceticus-baumannii NOT DETECTED NOT DETECTED Final   Bacteroides fragilis NOT DETECTED NOT DETECTED Final   Enterobacterales NOT DETECTED NOT DETECTED Final   Enterobacter cloacae complex NOT DETECTED NOT DETECTED Final   Escherichia coli NOT DETECTED NOT DETECTED Final   Klebsiella aerogenes NOT DETECTED NOT DETECTED Final   Klebsiella oxytoca NOT DETECTED NOT DETECTED Final   Klebsiella pneumoniae NOT DETECTED NOT DETECTED Final   Proteus species NOT DETECTED NOT DETECTED Final   Salmonella species NOT DETECTED NOT DETECTED Final   Serratia marcescens NOT DETECTED NOT DETECTED Final   Haemophilus influenzae NOT DETECTED NOT DETECTED Final   Neisseria meningitidis NOT DETECTED NOT DETECTED Final   Pseudomonas aeruginosa NOT DETECTED NOT DETECTED Final   Stenotrophomonas maltophilia NOT DETECTED NOT DETECTED Final   Candida albicans NOT DETECTED NOT DETECTED Final   Candida auris NOT DETECTED NOT DETECTED Final   Candida glabrata NOT DETECTED NOT DETECTED Final   Candida krusei NOT DETECTED NOT DETECTED Final   Candida parapsilosis NOT DETECTED NOT DETECTED Final   Candida tropicalis NOT DETECTED NOT DETECTED Final   Cryptococcus  neoformans/gattii NOT DETECTED NOT DETECTED Final    Comment: Performed at [facility], [place] [address] [place], [place] [address]  Blood culture (routine x 2)     Status: None (Preliminary result)   Collection Time: 03/31/21 10:55 AM   Specimen: BLOOD  Result Value Ref Range Status   Specimen Description   Final    BLOOD LEFT ARM Performed at [place] [address], [place], [place] [address]    Special Requests   Final    BOTTLES DRAWN AEROBIC AND ANAEROBIC Blood Culture results may not be optimal due to an inadequate volume of blood received in culture bottles Performed at [place] [address], [place], [place] [address]    Culture   Final    NO GROWTH 3 DAYS Performed at [facility], [place] [address], [place], [place] [address]    Report Status PENDING  Incomplete  Urine Culture     Status: None   Collection Time: 03/31/21  1:07 PM   Specimen: Urine, Clean Catch  Result Value Ref Range Status   Specimen Description   Final    URINE, CLEAN CATCH Performed  at [facility], [place] [address], [place], [place] [address]    Special Requests   Final    NONE Performed at [facility], [place] [address] [place], [place] [address]    Culture   Final    NO GROWTH Performed at [facility], [place] [address], [place], [place] [address]    Report Status 04/02/2021 FINAL  Final  C Difficile Quick Screen w PCR reflex     Status: Abnormal   Collection Time: 03/31/21  5:08 PM   Specimen: STOOL  Result Value Ref Range Status   C Diff antigen POSITIVE (A) NEGATIVE Final   C Diff toxin NEGATIVE NEGATIVE Final   C Diff interpretation Results are indeterminate. See PCR results.  Final    Comment: Performed at [facility], [place] [address] [place], [place] [address]  C. Diff by PCR, Reflexed     Status: Abnormal   Collection Time: 03/31/21  5:08 PM  Result Value Ref Range  Status   Toxigenic C. Difficile by PCR POSITIVE (A) NEGATIVE Final    Comment: Positive for toxigenic C. difficile with little to no toxin production. Only treat if clinical presentation suggests symptomatic illness. Performed at [facility], [place] [address], [place],  [address]   MRSA Next Gen by PCR, Nasal     Status: None   Collection Time: 04/01/21 11:10 AM   Specimen: Nasal Mucosa; Nasal Swab  Result Value Ref Range Status   MRSA by PCR Next Gen NOT DETECTED NOT DETECTED Final    Comment: (NOTE) The GeneXpert MRSA Assay (FDA approved for NASAL specimens only), is one component of a comprehensive MRSA colonization surveillance program. It is not intended to diagnose MRSA infection nor to guide or monitor treatment for MRSA infections. Test performance is not FDA approved in patients less than 58 years old. Performed at [facility], [place] [address] [place],  [address]       Studies: ECHOCARDIOGRAM COMPLETE  Result Date: 04/01/2021    ECHOCARDIOGRAM REPORT   Patient Name:   MILEA KLINK [facility] Date of Exam: 04/01/2021 Medical Rec #:  [id number]                    Height:       63.0 in Accession #:    [id number]                   Weight:       142.2 lb Date of Birth:  April 20, 1991                     BSA:          1.673 m Patient Age:    30 years                     BP:           140/59 mmHg Patient Gender: F                            HR:           104 bpm. Exam Location:  Inpatient Procedure: 2D Echo, Cardiac Doppler and Color Doppler Indications:    Cardiomyopathy-Unspecified I42.9  History:        Patient has prior history of Echocardiogram examinations, most  recent 12/08/2020. Risk Factors:Diabetes and Dyslipidemia.  Sonographer:    Bernadene Person RDCS Referring Phys: [place]  1. Left ventricular ejection fraction, by estimation, is 65 to 70%. The left ventricle has normal function. The left ventricle  has no regional wall motion abnormalities. Left ventricular diastolic parameters were normal.  2. Right ventricular systolic function is normal. The right ventricular size is normal. There is mildly elevated pulmonary artery systolic pressure.  3. The mitral valve is normal in structure. Trivial mitral valve regurgitation.  4. The aortic valve is normal in structure. Aortic valve regurgitation is not visualized. No aortic stenosis is present. FINDINGS  Left Ventricle: Left ventricular ejection fraction, by estimation, is 65 to 70%. The left ventricle has normal function. The left ventricle has no regional wall motion abnormalities. The left ventricular internal cavity size was small. There is no left ventricular hypertrophy. Left ventricular diastolic parameters were normal. Right Ventricle: The right ventricular size is normal. Right vetricular wall thickness was not well visualized. Right ventricular systolic function is normal. There is mildly elevated pulmonary artery systolic pressure. The tricuspid regurgitant velocity  is 2.94 m/s, and with an assumed right atrial pressure of 3 mmHg, the estimated right ventricular systolic pressure is 14.4 mmHg. Left Atrium: Left atrial size was normal in size. Right Atrium: Right atrial size was normal in size. Pericardium: There is no evidence of pericardial effusion. Mitral Valve: The mitral valve is normal in structure. Trivial mitral valve regurgitation. Tricuspid Valve: The tricuspid valve is normal in structure. Tricuspid valve regurgitation is mild. Aortic Valve: The aortic valve is normal in structure. Aortic valve regurgitation is not visualized. No aortic stenosis is present. Pulmonic Valve: The pulmonic valve was grossly normal. Pulmonic valve regurgitation is trivial. Aorta: The aortic root and ascending aorta are structurally normal, with no evidence of dilitation. IAS/Shunts: The interatrial septum was not well visualized.  LEFT VENTRICLE PLAX 2D LVIDd:          3.80 cm     Diastology LVIDs:         2.70 cm     LV e' medial:    8.43 cm/s LV PW:         0.80 cm     LV E/e' medial:  12.6 LV IVS:        0.70 cm     LV e' lateral:   10.30 cm/s LVOT diam:     1.80 cm     LV E/e' lateral: 10.3 LV SV:         42 LV SV Index:   25 LVOT Area:     2.54 cm  LV Volumes (MOD) LV vol d, MOD A2C: 67.6 ml LV vol d, MOD A4C: 77.5 ml LV vol s, MOD A2C: 29.6 ml LV vol s, MOD A4C: 32.4 ml LV SV MOD A2C:     38.0 ml LV SV MOD A4C:     77.5 ml LV SV MOD BP:      42.6 ml RIGHT VENTRICLE RV S prime:     13.60 cm/s TAPSE (M-mode): 2.4 cm LEFT ATRIUM             Index        RIGHT ATRIUM          Index LA diam:        1.80 cm 1.08 cm/m   RA Area:     9.32 cm LA Vol (A2C):   19.6 ml 11.72 ml/m  RA Volume:  17.50 ml 10.46 ml/m LA Vol (A4C):   21.6 ml 12.91 ml/m LA Biplane Vol: 21.0 ml 12.55 ml/m  AORTIC VALVE LVOT Vmax:   109.00 cm/s LVOT Vmean:  74.900 cm/s LVOT VTI:    0.166 m  AORTA Ao Root diam: 2.60 cm Ao Asc diam:  2.70 cm MITRAL VALVE                TRICUSPID VALVE MV Area (PHT): 5.62 cm     TR Peak grad:   34.6 mmHg MV Decel Time: 135 msec     TR Vmax:        294.00 cm/s MV E velocity: 106.00 cm/s MV A velocity: 104.00 cm/s  SHUNTS MV E/A ratio:  1.02         Systemic VTI:  0.17 m                             Systemic Diam: 1.80 cm Mertie Moores MD Electronically signed by Mertie Moores MD Signature Date/Time: 04/01/2021/4:21:52 PM    Final       Flora Lipps, MD  [facility] 04/03/2021  If 7PM-7AM, please contact night-coverage

## 2021-04-04 ENCOUNTER — Other Ambulatory Visit (HOSPITAL_COMMUNITY): Payer: Self-pay

## 2021-04-04 DIAGNOSIS — R5381 Other malaise: Secondary | ICD-10-CM

## 2021-04-04 DIAGNOSIS — R079 Chest pain, unspecified: Secondary | ICD-10-CM

## 2021-04-04 DIAGNOSIS — N1832 Chronic kidney disease, stage 3b: Secondary | ICD-10-CM

## 2021-04-04 LAB — COMPREHENSIVE METABOLIC PANEL
ALT: 14 U/L (ref 0–44)
AST: 15 U/L (ref 15–41)
Albumin: 1.6 g/dL — ABNORMAL LOW (ref 3.5–5.0)
Alkaline Phosphatase: 103 U/L (ref 38–126)
Anion gap: 6 (ref 5–15)
BUN: 19 mg/dL (ref 6–20)
CO2: 19 mmol/L — ABNORMAL LOW (ref 22–32)
Calcium: 7.5 mg/dL — ABNORMAL LOW (ref 8.9–10.3)
Chloride: 110 mmol/L (ref 98–111)
Creatinine, Ser: 1.38 mg/dL — ABNORMAL HIGH (ref 0.44–1.00)
GFR, Estimated: 53 mL/min — ABNORMAL LOW (ref >60)
Glucose, Bld: 135 mg/dL — ABNORMAL HIGH (ref 70–99)
Potassium: 3.8 mmol/L (ref 3.5–5.1)
Sodium: 135 mmol/L (ref 135–145)
Total Bilirubin: 0.5 mg/dL (ref 0.3–1.2)
Total Protein: 4.8 g/dL — ABNORMAL LOW (ref 6.5–8.1)

## 2021-04-04 LAB — CBC
HCT: 26.9 % — ABNORMAL LOW (ref 36.0–46.0)
Hemoglobin: 9 g/dL — ABNORMAL LOW (ref 12.0–15.0)
MCH: 27.4 pg (ref 26.0–34.0)
MCHC: 33.5 g/dL (ref 30.0–36.0)
MCV: 81.8 fL (ref 80.0–100.0)
Platelets: 275 10*3/uL (ref 150–400)
RBC: 3.29 MIL/uL — ABNORMAL LOW (ref 3.87–5.11)
RDW: 15.2 % (ref 11.5–15.5)
WBC: 15.5 10*3/uL — ABNORMAL HIGH (ref 4.0–10.5)
nRBC: 0 % (ref 0.0–0.2)

## 2021-04-04 LAB — GLUCOSE, CAPILLARY
Glucose-Capillary: 123 mg/dL — ABNORMAL HIGH (ref 70–99)
Glucose-Capillary: 170 mg/dL — ABNORMAL HIGH (ref 70–99)
Glucose-Capillary: 90 mg/dL (ref 70–99)
Glucose-Capillary: 116 mg/dL — ABNORMAL HIGH (ref 70–99)

## 2021-04-04 LAB — MAGNESIUM: Magnesium: 1.6 mg/dL — ABNORMAL LOW (ref 1.7–2.4)

## 2021-04-04 LAB — PHOSPHORUS: Phosphorus: 3.5 mg/dL (ref 2.5–4.6)

## 2021-04-04 MED ORDER — NYSTATIN 100000 UNIT/ML MT SUSP
5.0000 mL | Freq: Four times a day (QID) | OROMUCOSAL | Status: DC
  Administered 2021-04-04: 500000 [IU] via ORAL
  Filled 2021-04-04 (×2): qty 5

## 2021-04-04 MED ORDER — LIDOCAINE VISCOUS HCL 2 % MT SOLN
15.0000 mL | Freq: Once | OROMUCOSAL | Status: AC | PRN
  Administered 2021-04-04: 15 mL via OROMUCOSAL
  Filled 2021-04-04: qty 15

## 2021-04-04 MED ORDER — MORPHINE SULFATE (PF) 2 MG/ML IV SOLN
1.0000 mg | Freq: Once | INTRAVENOUS | Status: AC
  Administered 2021-04-04: 1 mg via INTRAVENOUS
  Filled 2021-04-04: qty 1

## 2021-04-04 MED ORDER — ISOSORBIDE MONONITRATE ER 30 MG PO TB24
30.0000 mg | ORAL_TABLET | Freq: Every day | ORAL | Status: DC
  Administered 2021-04-04 – 2021-04-05 (×2): 30 mg via ORAL
  Filled 2021-04-04 (×2): qty 1

## 2021-04-04 MED ORDER — SUCRALFATE 1 G PO TABS
1.0000 g | ORAL_TABLET | Freq: Three times a day (TID) | ORAL | Status: DC
  Administered 2021-04-04 – 2021-04-05 (×3): 1 g via ORAL
  Filled 2021-04-04 (×3): qty 1

## 2021-04-04 MED ORDER — FAMOTIDINE 20 MG PO TABS
20.0000 mg | ORAL_TABLET | Freq: Two times a day (BID) | ORAL | Status: DC
  Administered 2021-04-04 – 2021-04-05 (×2): 20 mg via ORAL
  Filled 2021-04-04 (×2): qty 1

## 2021-04-04 NOTE — Progress Notes (Signed)
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  Of note, she was recently admitted at [facility] hospital from 11/21 through 11/27 with DKA in the setting of COVID-19 and influenza infection.  On this presentation patient was noted to be hypothermic, tachycardic with low blood pressure and some mental confusion.  Patient's mother reported that she had been noncompliant with home insulin regimen, was reportedly on insulin pump that has not been used.  Initial venous blood gas showed pH of 6.9 with sodium of 116 and potassium of 5.4.  Bicarb was less than 7 with glucose of 1330.  BUN is elevated with a creatinine of 2.7 and lactate elevated at 5.2.  Patient did have leukocytosis with WBC of 57.8 and INR of 1.6.  Repeat influenza and COVID screening test was negative.  Chest x-ray showed mild left lower streaky opacities thought to be secondary to atelectasis versus early pneumonia.  Also had some diarrhea in the ED.  Patient was given IV fluid resuscitation, IV insulin and was admitted to the hospital.  During hospitalization, patient was treated with IV insulin for DKA and has been started on p.o. vancomycin for C. difficile diarrhea.    Patient was subsequently considered stable for transfer out of the ICU to medical service on 04/02/2021.   Assessment/Plan:  Active Problems:   DKA (diabetic ketoacidosis) (HCC)  Severe diabetic ketoacidosis.  Likely secondary to noncompliance with insulin Regimen/possible infection.   Insulin pump was discontinued and patient was transitioned on long-acting and sliding scale insulin while in the hospital.  Diabetic coordinator has seen the patient and recommend basal bolus insulin  on discharge.  Patient is 15 units of the long-acting with NovoLog correction insulin on discharge.  Patient will need to follow-up with her endocrinologist to discuss about insulin pump adjustment on discharge.  Hemoglobin A1c was 10.5.  Intermittent episodes of chest pain.  Especially during eating.  Patient did have some EKG  with normal findings.  Patient states pain especially during swallowing.  Possibility of esophageal spasm, acid reflux as well.  Trying to avoid PPI due to C. difficile infection going on.  We will add it, sucralfate.  We will try Imdur.  Consulted a speech and swallow evaluation for further evaluation. If unimproved by tomorrow could consider a GI evaluation.  Patient with uncontrolled diabetes could have esophagitis/fungal infection as well.  Empirically put the patient on nystatin swish and swallow.  Acute metabolic encephalopathy secondary to DKA.  Resolved at this time.  Severe volume depletion secondary to DKA and diarrhea.  Improved at this time.  Bacillus bacteremia.  Likely contaminant.  Was initially on antibiotic which was discontinued.  .  Sepsis likely secondary to C. difficile diarrhea.  Antigen positive but toxin negative.  Very high WBC count with foul-smelling diarrhea patient was clinically treated with oral vancomycin with improvement.  Was initially on a rectal tube which has been discontinued.  Was on IV Flagyl since that she was not taking p.o.  At this time she is taking p.o. so we will discontinue IV Flagyl.  Leukocytosis trending down.  WBC at 15.5 from 20.3 yesterday.  Significant acute on chronic anemia.  Patient received 1 unit of packed RBC for hemoglobin of 6.6.  Hemoglobin  has since then remained stable.  Latest hemoglobin of 9.0.  Significant leukocytosis secondary to C. difficile infection. Leukocytosis trending down.  Hyponatremia. Resolved at this time.  Hypophosphatemia.   Replenished with IV K-Phos with improvement of level to  3.5.  Hypokalemia.  Improved after replacement.  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  Secondary to diabetes.  Creatinine improving at 1.3 from 1.7 yesterday  Severe protein calorie malnutrition.  Present on admission.  Encourage oral nutrition.  Debility, deconditioning.  Physical therapy has evaluated the patient and recommended home health on discharge.  DVT prophylaxis: heparin injection 5,000 Units Start: 03/31/21 1430 SCDs Start: 03/31/21 1419  Disposition:  Home likely tomorrow if pain improved if not might need GI evaluation.  Code Status: Full code  Family Communication:  Spoke with the patient's mother on the phone and updated her about the clinical condition of the patient on 04/03/2021.  Patient's mother states that her daughter doesn't really share her medical issues with her.  Status is: Inpatient  Remains inpatient appropriate because: DKA, electrolyte imbalance, C. difficile infection,  Consultants: PCCM  Procedures: None  Anti-infectives:  oral vancomycin  Anti-infectives (From admission, onward)    Start     Dose/Rate Route Frequency Ordered Stop   04/02/21 1200  Vancomycin (VANCOCIN) 1,250 mg in sodium chloride 0.9 % 250 mL IVPB  Status:  Discontinued        1,250 mg 166.7 mL/hr over 90 Minutes Intravenous Every 48 hours 03/31/21 1502 04/01/21 0756   04/02/21 1000  vancomycin (VANCOREADY) IVPB 1250 mg/250 mL  Status:  Discontinued        1,250 mg 166.7 mL/hr over 90 Minutes Intravenous Every 48 hours 04/01/21 1015 04/02/21 1019   04/01/21 1400  metroNIDAZOLE (FLAGYL) IVPB 500 mg  Status:  Discontinued        500 mg 100 mL/hr over 60 Minutes Intravenous Every 8 hours 04/01/21 1341 04/04/21 0954   04/01/21 1100  ceFEPIme (MAXIPIME) 2 g in sodium chloride 0.9 % 100 mL IVPB  Status:  Discontinued        2 g 200 mL/hr over 30 Minutes Intravenous Every 12 hours 04/01/21 1014 04/02/21 1019   04/01/21 1000  ceFEPIme (MAXIPIME) 2 g in sodium chloride 0.9 % 100 mL  IVPB  Status:  Discontinued        2 g 200 mL/hr over 30 Minutes Intravenous Every 24 hours 03/31/21 1502 04/01/21 0756   03/31/21 2230  vancomycin (VANCOCIN) capsule 125 mg  Status:  Discontinued        125 mg Oral 4 times daily 03/31/21 2136 03/31/21 2141   03/31/21 2230  vancomycin (VANCOCIN) capsule 125 mg  Status:  Discontinued        125 mg Oral 4 times daily 03/31/21 2136 03/31/21 2140   03/31/21 2230  vancomycin (VANCOCIN) capsule 125 mg        125 mg Oral 4 times daily 03/31/21 2141 04/10/21 2159   03/31/21 1615  vancomycin (VANCOREADY) IVPB 500 mg/100 mL        500 mg 100 mL/hr over 60 Minutes Intravenous  Once 03/31/21 1525 03/31/21 1653   03/31/21 1600  Vancomycin (VANCOCIN) 1,500 mg in sodium chloride 0.9 % 500 mL IVPB  Status:  Discontinued        1,500 mg 250 mL/hr over 120 Minutes Intravenous  Once 03/31/21 1502 03/31/21 1503   03/31/21 1545  vancomycin (VANCOCIN) 500 mg in sodium chloride 0.9 % 100 mL IVPB  Status:  Discontinued  500 mg 100 mL/hr over 60 Minutes Intravenous  Once 03/31/21 1456 03/31/21 1525   03/31/21 1100  ceFEPIme (MAXIPIME) 2 g in sodium chloride 0.9 % 100 mL IVPB        2 g 200 mL/hr over 30 Minutes Intravenous  Once 03/31/21 1053 03/31/21 1134   03/31/21 1100  vancomycin (VANCOCIN) IVPB 1000 mg/200 mL premix        1,000 mg 200 mL/hr over 60 Minutes Intravenous  Once 03/31/21 1053 03/31/21 1222      Subjective: Today, patient was seen and examined at bedside.  Continues to complain of the chest pain especially during swallowing.  Complains of generalized weakness but was able to ambulate some with physical therapy.  Objective: Vitals:   04/04/21 0514 04/04/21 1126  BP: 127/83   Pulse: 100 (!) 116  Resp: 16   Temp: 98.1 F (36.7 C)   SpO2: 99%     Intake/Output Summary (Last 24 hours) at 04/04/2021 1425 Last data filed at 04/04/2021 1100 Gross per 24 hour  Intake 2089.32 ml  Output 900 ml  Net 1189.32 ml    Filed Weights    04/02/21 0500 04/03/21 0639 04/04/21 0500  Weight: 66.6 kg 62.4 kg 61.5 kg   Body mass index is 24.02 kg/m.   Physical Exam:  General: Average built, mild periorbital puffiness, HENT:   No scleral pallor or icterus noted. Oral mucosa is moist.  Chest:  Clear breath sounds.  Diminished breath sounds bilaterally. No crackles or wheezes.  CVS: S1 &S2 heard. No murmur.  Regular rate and rhythm. Abdomen: Soft, nontender, nondistended.  Bowel sounds are heard.   Extremities: No cyanosis, clubbing or edema.  Peripheral pulses are palpable. Psych: Alert, awake and oriented, normal mood CNS:  No cranial nerve deficits.  Power equal in all extremities.   Skin: Warm and dry.  No rashes noted.  Data Review: I have personally reviewed the following laboratory data and studies,  CBC: Recent Labs  Lab 04/01/21 2016 04/02/21 0250 04/03/21 0017 04/03/21 1119 04/04/21 0925  WBC 35.5* 28.4* 23.2* 20.3* 15.5*  HGB 9.2* 8.4* 8.8* 9.3* 9.0*  HCT 26.5* 24.4* 25.9* 28.0* 26.9*  MCV 79.6* 79.5* 80.7 81.6 81.8  PLT 313 309 285 310 176    Basic Metabolic Panel: Recent Labs  Lab 04/01/21 0022 04/01/21 0334 04/01/21 2016 04/02/21 0250 04/03/21 0017 04/03/21 1119 04/04/21 0925  NA 126*   < > 133* 133* 134* 134* 135  K 3.3*   < > 3.6 3.6 3.5 3.4* 3.8  CL 101   < > 110 110 110 109 110  CO2 14*   < > 16* 17* 18* 20* 19*  GLUCOSE 681*   < > 184* 130* 117* 176* 135*  BUN 35*   < > 28* 23* 21* 20 19  CREATININE 2.43*   < > 2.08* 1.94* 1.71* 1.58* 1.38*  CALCIUM 7.3*   < > 7.7* 7.3* 7.5* 7.7* 7.5*  MG 1.5*  --  2.3 2.1  --  1.8 1.6*  PHOS 2.0*  --  1.8* 1.8*  --  1.9* 3.5   < > = values in this interval not displayed.    Liver Function Tests: Recent Labs  Lab 04/01/21 2016 04/02/21 0250 04/03/21 0017 04/03/21 1119 04/04/21 0925  AST 24 20 28 23 15   ALT 16 16 17 17 14   ALKPHOS 109 108 111 119 103  BILITOT 0.6 0.5 0.5 0.4 0.5  PROT 4.6* 4.1* 4.7* 4.9* 4.8*  ALBUMIN  1.6* <1.5* 1.6* 1.7*  1.6*    Recent Labs  Lab 03/31/21 1551  LIPASE 25  AMYLASE 11*    No results for input(s): AMMONIA in the last 168 hours. Cardiac Enzymes: No results for input(s): CKTOTAL, CKMB, CKMBINDEX, TROPONINI in the last 168 hours. BNP (last 3 results) No results for input(s): BNP in the last 8760 hours.  ProBNP (last 3 results) No results for input(s): PROBNP in the last 8760 hours.  CBG: Recent Labs  Lab 04/03/21 1749 04/03/21 1848 04/03/21 2111 04/04/21 0744 04/04/21 1149  GLUCAP 42* 59* 120* 123* 170*    Recent Results (from the past 240 hour(s))  Resp Panel by RT-PCR (Flu A&B, Covid) Nasopharyngeal Swab     Status: None   Collection Time: 03/31/21 10:45 AM   Specimen: Nasopharyngeal Swab; Nasopharyngeal(NP) swabs in vial transport medium  Result Value Ref Range Status   SARS Coronavirus 2 by RT PCR NEGATIVE NEGATIVE Final    Comment: (NOTE) SARS-CoV-2 target nucleic acids are NOT DETECTED.  The SARS-CoV-2 RNA is generally detectable in upper respiratory specimens during the acute phase of infection. The lowest concentration of SARS-CoV-2 viral copies this assay can detect is 138 copies/mL. A negative result does not preclude SARS-Cov-2 infection and should not be used as the sole basis for treatment or other patient management decisions. A negative result may occur with  improper specimen collection/handling, submission of specimen other than nasopharyngeal swab, presence of viral mutation(s) within the areas targeted by this assay, and inadequate number of viral copies(<138 copies/mL). A negative result must be combined with clinical observations, patient history, and epidemiological information. The expected result is Negative.  Fact Sheet for Patients:  EntrepreneurPulse.com.au  Fact Sheet for Healthcare Providers:  IncredibleEmployment.be  This test is no t yet approved or cleared by the [place] FDA and  has been  authorized for detection and/or diagnosis of SARS-CoV-2 by FDA under an Emergency Use Authorization (EUA). This EUA will remain  in effect (meaning this test can be used) for the duration of the COVID-19 declaration under Section 564(b)(1) of the Act, 21 U.S.C.section 360bbb-3(b)(1), unless the authorization is terminated  or revoked sooner.       Influenza A by PCR NEGATIVE NEGATIVE Final   Influenza B by PCR NEGATIVE NEGATIVE Final    Comment: (NOTE) The Xpert Xpress SARS-CoV-2/FLU/RSV plus assay is intended as an aid in the diagnosis of influenza from Nasopharyngeal swab specimens and should not be used as a sole basis for treatment. Nasal washings and aspirates are unacceptable for Xpert Xpress SARS-CoV-2/FLU/RSV testing.  Fact Sheet for Patients: EntrepreneurPulse.com.au  Fact Sheet for Healthcare Providers: IncredibleEmployment.be  This test is not yet approved or cleared by the [place] FDA and has been authorized for detection and/or diagnosis of SARS-CoV-2 by FDA under an Emergency Use Authorization (EUA). This EUA will remain in effect (meaning this test can be used) for the duration of the COVID-19 declaration under Section 564(b)(1) of the Act, 21 U.S.C. section 360bbb-3(b)(1), unless the authorization is terminated or revoked.  Performed at [facility], [place] [address], [place], [place] [address]   Blood culture (routine x 2)     Status: Abnormal   Collection Time: 03/31/21 10:47 AM   Specimen: BLOOD  Result Value Ref Range Status   Specimen Description   Final    BLOOD RIGHT ARM Performed at [facility] [address] [place], [place] [address]    Special Requests   Final  BOTTLES DRAWN AEROBIC AND ANAEROBIC Blood Culture adequate volume Performed at [facility] [address] [place], [place] [address]    Culture  Setup Time (A)  Final    GRAM  VARIABLE ROD AEROBIC BOTTLE ONLY CRITICAL RESULT CALLED TO, READ BACK BY AND VERIFIED WITH: PHARMD D.WOFFORD AT 0930 ON 04/01/2021 BY T.SAAD.    Culture (A)  Final    BACILLUS SPECIES Standardized susceptibility testing for this organism is not available. Performed at [facility], [place] [address], [place], [place] [address]    Report Status 04/03/2021 FINAL  Final  Blood Culture ID Panel (Reflexed)     Status: None   Collection Time: 03/31/21 10:47 AM  Result Value Ref Range Status   Enterococcus faecalis NOT DETECTED NOT DETECTED Final   Enterococcus Faecium NOT DETECTED NOT DETECTED Final   Listeria monocytogenes NOT DETECTED NOT DETECTED Final   Staphylococcus species NOT DETECTED NOT DETECTED Final   Staphylococcus aureus (BCID) NOT DETECTED NOT DETECTED Final   Staphylococcus epidermidis NOT DETECTED NOT DETECTED Final   Staphylococcus lugdunensis NOT DETECTED NOT DETECTED Final   Streptococcus species NOT DETECTED NOT DETECTED Final   Streptococcus agalactiae NOT DETECTED NOT DETECTED Final   Streptococcus pneumoniae NOT DETECTED NOT DETECTED Final   Streptococcus pyogenes NOT DETECTED NOT DETECTED Final   A.calcoaceticus-baumannii NOT DETECTED NOT DETECTED Final   Bacteroides fragilis NOT DETECTED NOT DETECTED Final   Enterobacterales NOT DETECTED NOT DETECTED Final   Enterobacter cloacae complex NOT DETECTED NOT DETECTED Final   Escherichia coli NOT DETECTED NOT DETECTED Final   Klebsiella aerogenes NOT DETECTED NOT DETECTED Final   Klebsiella oxytoca NOT DETECTED NOT DETECTED Final   Klebsiella pneumoniae NOT DETECTED NOT DETECTED Final   Proteus species NOT DETECTED NOT DETECTED Final   Salmonella species NOT DETECTED NOT DETECTED Final   Serratia marcescens NOT DETECTED NOT DETECTED Final   Haemophilus influenzae NOT DETECTED NOT DETECTED Final   Neisseria meningitidis NOT DETECTED NOT DETECTED Final   Pseudomonas aeruginosa NOT DETECTED NOT DETECTED Final    Stenotrophomonas maltophilia NOT DETECTED NOT DETECTED Final   Candida albicans NOT DETECTED NOT DETECTED Final   Candida auris NOT DETECTED NOT DETECTED Final   Candida glabrata NOT DETECTED NOT DETECTED Final   Candida krusei NOT DETECTED NOT DETECTED Final   Candida parapsilosis NOT DETECTED NOT DETECTED Final   Candida tropicalis NOT DETECTED NOT DETECTED Final   Cryptococcus neoformans/gattii NOT DETECTED NOT DETECTED Final    Comment: Performed at [facility], [place] [address], [place], [place] [address]  Blood culture (routine x 2)     Status: None (Preliminary result)   Collection Time: 03/31/21 10:55 AM   Specimen: BLOOD  Result Value Ref Range Status   Specimen Description   Final    BLOOD LEFT ARM Performed at [facility] [address], [place], [place] [address]    Special Requests   Final    BOTTLES DRAWN AEROBIC AND ANAEROBIC Blood Culture results may not be optimal due to an inadequate volume of blood received in culture bottles Performed at [facility] [address], [place], [place] [address]    Culture   Final    NO GROWTH 4 DAYS Performed at [facility], [place] [address], [place], [place] [address]    Report Status PENDING  Incomplete  Urine Culture     Status: None   Collection Time: 03/31/21  1:07 PM  Specimen: Urine, Clean Catch  Result Value Ref Range Status   Specimen Description   Final    URINE, CLEAN CATCH Performed at [facility], [place] [address] [place], [place] [address]    Special Requests   Final    NONE Performed at [facility], [place] [address], [place], [place] [address]    Culture   Final    NO GROWTH Performed at [facility], [place] [address], [place], [place] [address]    Report Status 04/02/2021 FINAL  Final  C Difficile Quick Screen w PCR reflex     Status: Abnormal   Collection Time: 03/31/21  5:08 PM   Specimen: STOOL  Result Value  Ref Range Status   C Diff antigen POSITIVE (A) NEGATIVE Final   C Diff toxin NEGATIVE NEGATIVE Final   C Diff interpretation Results are indeterminate. See PCR results.  Final    Comment: Performed at [facility], [place] [address], [place], [place] [address]  C. Diff by PCR, Reflexed     Status: Abnormal   Collection Time: 03/31/21  5:08 PM  Result Value Ref Range Status   Toxigenic C. Difficile by PCR POSITIVE (A) NEGATIVE Final    Comment: Positive for toxigenic C. difficile with little to no toxin production. Only treat if clinical presentation suggests symptomatic illness. Performed at [facility], [place] [address] [place], [place] [address]   MRSA Next Gen by PCR, Nasal     Status: None   Collection Time: 04/01/21 11:10 AM   Specimen: Nasal Mucosa; Nasal Swab  Result Value Ref Range Status   MRSA by PCR Next Gen NOT DETECTED NOT DETECTED Final    Comment: (NOTE) The GeneXpert MRSA Assay (FDA approved for NASAL specimens only), is one component of a comprehensive MRSA colonization surveillance program. It is not intended to diagnose MRSA infection nor to guide or monitor treatment for MRSA infections. Test performance is not FDA approved in patients less than 33 years old. Performed at [facility], [place] [address], [place], [place] [address]       Studies: No results found.    Flora Lipps, MD  [facility] 04/04/2021  If 7PM-7AM, please contact night-coverage

## 2021-04-04 NOTE — Evaluation (Signed)
Physical Therapy Evaluation Patient Details Name: RAMAH LANGHANS MRN: [id number] DOB: 1990-09-09 Today's Date: 04/04/2021  History of Present Illness  30 year old female presented to hospital on 03/31/2021 with elevated blood glucose levels.  Of note, she was recently admitted at [facility] from 11/21 through 11/27 with DKA in the setting of COVID-19 and influenza infection.  On this presentation patient was noted to be hypothermic, tachycardic with low blood pressure and some mental confusion.  Patient's mother reported that she had been noncompliant with home insulin regimen, was reportedly on insulin pump that has not been used. Dx of DKA, sepsis.   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  Clinical Impression  Pt admitted with above diagnosis. Pt sleeping soundly at start of PT session. She ambulated 68' in the room without an assistive device, but with frequent reaching for the walls and bedrails for support. She declined use of an assistive device. HR 129 with walking, 116 at rest, no dyspnea. Pt declined further distance with ambulation 2* feeling tired.  Pt currently with functional limitations due to the deficits listed below (see PT Problem List). Pt will benefit from skilled PT to increase their independence and safety with mobility to allow discharge to the venue listed below.          Recommendations for follow up therapy are one component of a multi-disciplinary discharge planning process, led by the attending physician.  Recommendations may be updated based on patient status, additional functional criteria and insurance authorization.  Follow Up Recommendations Home health PT    Assistance Recommended at Discharge Set up Supervision/Assistance  Functional Status Assessment Patient has had a recent decline in their functional status and demonstrates the ability to make significant improvements in function in  a reasonable and predictable amount of time.  Equipment Recommendations  None recommended by PT    Recommendations for Other Services       Precautions / Restrictions Precautions Precautions: None Precaution Comments: denies falls in past 6 months Restrictions Weight Bearing Restrictions: No      Mobility  Bed Mobility Overal bed mobility: Independent                  Transfers Overall transfer level: Independent                      Ambulation/Gait Ambulation/Gait assistance: Supervision Gait Distance (Feet): 35 Feet Assistive device: None   Gait velocity: WFL     General Gait Details: mild unsteadiness without overt LOB, pt reached for wall and bedrail, declined AD; HR 116 at rest, 129 with walking, no dyspnea  Stairs            Wheelchair Mobility    Modified Rankin (Stroke Patients Only)       Balance Overall balance assessment: Modified Independent   Sitting balance-Leahy Scale: Normal       Standing balance-Leahy Scale: Good Standing balance comment: reaching for support on wall and bedrail while walking, no overt LOB, refused AD                             Pertinent Vitals/Pain Pain Assessment: No/denies pain    Home Living Family/patient expects to be discharged to:: Private residence Living Arrangements: Parent Available Help at Discharge: Family;Available PRN/intermittently Type of Home: Apartment Home Access: Stairs to enter   Entrance Stairs-Number of Steps: flight  Home Layout: One level Home Equipment: None      Prior Function Prior Level of Function : Independent/Modified Independent                     Hand Dominance        Extremity/Trunk Assessment   Upper Extremity Assessment Upper Extremity Assessment: Overall WFL for tasks assessed    Lower Extremity Assessment Lower Extremity Assessment: Overall WFL for tasks assessed    Cervical / Trunk Assessment Cervical / Trunk  Assessment: Normal  Communication   Communication: No difficulties  Cognition Arousal/Alertness: Lethargic Behavior During Therapy: WFL for tasks assessed/performed Overall Cognitive Status: Within Functional Limits for tasks assessed                                 General Comments: pt reports she's tired        General Comments      Exercises     Assessment/Plan    PT Assessment Patient needs continued PT services  PT Problem List Decreased activity tolerance;Decreased balance       PT Treatment Interventions Gait training;Balance training    PT Goals (Current goals can be found in the Care Plan section)  Acute Rehab PT Goals Patient Stated Goal: return home PT Goal Formulation: With patient Time For Goal Achievement: 04/18/21 Potential to Achieve Goals: Good    Frequency Min 3X/week   Barriers to discharge        Co-evaluation               AM-PAC PT "6 Clicks" Mobility  Outcome Measure Help needed turning from your back to your side while in a flat bed without using bedrails?: None Help needed moving from lying on your back to sitting on the side of a flat bed without using bedrails?: None Help needed moving to and from a bed to a chair (including a wheelchair)?: None Help needed standing up from a chair using your arms (e.g., wheelchair or bedside chair)?: None Help needed to walk in hospital room?: A Little Help needed climbing 3-5 steps with a railing? : None 6 Click Score: 23    End of Session   Activity Tolerance: Patient limited by fatigue Patient left: in bed;with call bell/phone within reach Nurse Communication: Mobility status PT Visit Diagnosis: Unsteadiness on feet (R26.81);Difficulty in walking, not elsewhere classified (R26.2)    Time: 2706-2376 PT Time Calculation (min) (ACUTE ONLY): 10 min   Charges:   PT Evaluation $PT Eval Low Complexity: 1 Low         Blondell Reveal Kistler PT 04/04/2021  Acute  Rehabilitation Services Pager [phone] Office [phone]

## 2021-04-04 NOTE — Progress Notes (Signed)
Inpatient Diabetes Program Recommendations  AACE/ADA: New Consensus Statement on Inpatient Glycemic Control (2015)  Target Ranges:  Prepandial:   less than 140 mg/dL      Peak postprandial:   less than 180 mg/dL (1-2 hours)      Critically ill patients:  140 - 180 mg/dL   Lab Results  Component Value Date   GLUCAP 170 (H) 04/04/2021   HGBA1C 10.5 (H) 03/07/2021    Review of Glycemic Control  Diabetes history: DM1 Outpatient Diabetes medications: OmniPod insulin pump with Novolog 0-9  TID with meals and 0-5 HS + 3 units TID Current orders for Inpatient glycemic control: Lantus 15 units QD, Novolog   Inpatient Diabetes Program Recommendations:    NOTE: Patient has Type 1 DM and has had multiple admissions for DKA (most recent hospitalization 03/07/21-03/09/21. Inpatient diabetes coordinator spoke with patient on 03/08/21 during last hospitalization and on 12/08/20 during another admission. Patient's Endocrinologist is Dr. Kelton Pillar and patient's last appointment was 12/16/20.   Per office note on 12/16/20, the following should be insulin pump settings:   Basal Rates 00:00   0.5 units/hr 08:00   0:6 units/hr Total Basal per day: 13.6 units/24 hours   Insulin to Carb Ratio 1:12 (1 unit covers 12 grams of carbs)   Insulin Sensitivity 1:65 (1 unit drops glucose 65 mg/dl) Target glucose: 100 mg/dl  Discussed A1C results (10.5% on 03/07/21) and explained that most current A1C indicates an average glucose of 255 mg/dl over the past 2-3 months. Patient reports that her prior A1C was 13% so most current A1C is an improvement.  Recs for home:  Lantus 15 units QD Novolog 1:12 CHO ratio Novolog correction 1:65 (1 units drops glucose 65 mg/dL)  Follow-up as soon as possible with Endo to have pump checked out and settings updated if needed.   Continue to follow.  Thank you. Lorenda Peck, RD, LDN, CDE Inpatient Diabetes Coordinator [phone]

## 2021-04-04 NOTE — Progress Notes (Signed)
Pt c/o chest pain and pt said said that it happens while she's eating and sometimes she has to stop eating. MD aware. See new orders.

## 2021-04-04 NOTE — Evaluation (Signed)
Clinical/Bedside Swallow Evaluation Patient Details  Name: Ashley Freeman MRN: [id number] Date of Birth: 1991-03-17  Today's Date: 04/04/2021 Time: SLP Start Time (ACUTE ONLY): 5277 SLP Stop Time (ACUTE ONLY): 8242 SLP Time Calculation (min) (ACUTE ONLY): 14 min  Past Medical History:  Past Medical History:  Diagnosis Date   Abscess, gluteal, right 08/24/2013   AKI (acute kidney injury) ([place]) 07/26/2014   Anemia 02/19/2012   Bartholin's gland abscess 09/19/2013   BV (bacterial vaginosis) 11/24/2015   Diabetes mellitus type I ([place]) 2001   Diagnosed at age 50 ; Type I   Diarrhea 05/30/2016   DKA (diabetic ketoacidoses) 08/19/2013   Also in 2018   Gonorrhea 08/2011   Treated in 09/2011   History of trichomoniasis 05/31/2016   Hyperlipidemia 03/28/2016   Sepsis ([place]) 09/19/2013   Past Surgical History:  Past Surgical History:  Procedure Laterality Date   CESAREAN SECTION N/A 10/05/2019   Procedure: CESAREAN SECTION;  Surgeon: Aletha Halim, MD;  Location: [facility];  Service: Obstetrics;  Laterality: N/A;   INCISION AND DRAINAGE ABSCESS Left 09/28/2019   Procedure: INCISION AND DRAINAGE VULVAR ABCESS;  Surgeon: [name], MD;  Location: [facility];  Service: Gynecology;  Laterality: Left;   INCISION AND DRAINAGE PERIRECTAL ABSCESS Right 08/18/2013   Procedure: IRRIGATION AND DEBRIDEMENT GLUTEAL ABSCESS;  Surgeon: Ralene Ok, MD;  Location: [place];  Service: General;  Laterality: Right;   INCISION AND DRAINAGE PERIRECTAL ABSCESS Right 09/19/2013   Procedure: IRRIGATION AND DEBRIDEMENT RIGHT GLUTEAL AND LABIAL ABSCESSES;  Surgeon: Ralene Ok, MD;  Location: [place];  Service: General;  Laterality: Right;   INCISION AND DRAINAGE PERIRECTAL ABSCESS Right 09/24/2013   Procedure: IRRIGATION AND DEBRIDEMENT PERIRECTAL ABSCESS;  Surgeon: Gwenyth Ober, MD;  Location: [place];  Service: General;  Laterality: Right;   HPI:    Admitted with AMS, recent admit to [place] with DKA and  COVID +.     Assessment / Plan / Recommendation  Clinical Impression  No indications concerning for oropharyngeal dysphagia.  Cranial nerve exam unremarkable and pt denies any h/o dysphagia.  Pt observed consuming 4 saltines, entire bowl of broth, diet gingerale and water.  She did indicate chest discomfort x1 at esophagus when swallowing gingerale (that had just been opened - thus very carbonated) x1 of approximately 15 swallows.  No odynophagia with water, full bowl of broth and 4 saltine crackers- nor indications of oropharyngeal dysphagia.  Eructation x1 observed with pt pointing to pharynx to indicate pain - suspect potential reflux at that time.  Xerostomia reported by pt for which SLP provided compensations.  She reports chest discomfort with po - with concern level being 4/10.  She is willing to dc home and observe her discomfort for improvement - following up with her PCP if if does not abate.   Advised her to avoid acidic, spicy and very cold items and to drink plenty of liquids with her meals. SLP Visit Diagnosis: Dysphagia, unspecified (R13.10)    Aspiration Risk  Mild aspiration risk    Diet Recommendation Dysphagia 3 (Mech soft);Thin liquid   Liquid Administration via: Cup;Straw Medication Administration: Whole meds with liquid Supervision: Patient able to self feed Compensations: Slow rate;Small sips/bites (start all intake with liquids) Postural Changes: Seated upright at 90 degrees;Remain upright for at least 30 minutes after po intake    Other  Recommendations Oral Care Recommendations: Oral care BID    Recommendations for follow up therapy are one component of a multi-disciplinary discharge planning  process, led by the attending physician.  Recommendations may be updated based on patient status, additional functional criteria and insurance authorization.  Follow up Recommendations        Assistance Recommended at Discharge None  Functional Status Assessment  N/a   Frequency and Duration     N/a       Prognosis   N;/a     Swallow Study   General   N/a   Oral/Motor/Sensory Function Overall Oral Motor/Sensory Function: Within functional limits   Ice Chips Ice chips: Not tested   Thin Liquid Thin Liquid: Within functional limits Presentation: Cup;Straw    Nectar Thick Nectar Thick Liquid: Not tested   Honey Thick Honey Thick Liquid: Not tested   Puree Puree: Not tested   Solid     Solid: Within functional limits Presentation: Self Fed      Ashley Freeman 04/04/2021,4:18 PM  Kathleen Lime, MS [place] Office [phone] Pager [phone]

## 2021-04-05 LAB — CBC
HCT: 23.8 % — ABNORMAL LOW (ref 36.0–46.0)
Hemoglobin: 7.7 g/dL — ABNORMAL LOW (ref 12.0–15.0)
MCH: 27.1 pg (ref 26.0–34.0)
MCHC: 32.4 g/dL (ref 30.0–36.0)
MCV: 83.8 fL (ref 80.0–100.0)
Platelets: 272 10*3/uL (ref 150–400)
RBC: 2.84 MIL/uL — ABNORMAL LOW (ref 3.87–5.11)
RDW: 15.3 % (ref 11.5–15.5)
WBC: 13.3 10*3/uL — ABNORMAL HIGH (ref 4.0–10.5)
nRBC: 0 % (ref 0.0–0.2)

## 2021-04-05 LAB — COMPREHENSIVE METABOLIC PANEL
ALT: 12 U/L (ref 0–44)
AST: 16 U/L (ref 15–41)
Albumin: 1.6 g/dL — ABNORMAL LOW (ref 3.5–5.0)
Alkaline Phosphatase: 94 U/L (ref 38–126)
Anion gap: 3 — ABNORMAL LOW (ref 5–15)
BUN: 16 mg/dL (ref 6–20)
CO2: 21 mmol/L — ABNORMAL LOW (ref 22–32)
Calcium: 7.5 mg/dL — ABNORMAL LOW (ref 8.9–10.3)
Chloride: 111 mmol/L (ref 98–111)
Creatinine, Ser: 1.36 mg/dL — ABNORMAL HIGH (ref 0.44–1.00)
GFR, Estimated: 54 mL/min — ABNORMAL LOW (ref >60)
Glucose, Bld: 60 mg/dL — ABNORMAL LOW (ref 70–99)
Potassium: 3.6 mmol/L (ref 3.5–5.1)
Sodium: 135 mmol/L (ref 135–145)
Total Bilirubin: 0.5 mg/dL (ref 0.3–1.2)
Total Protein: 4.3 g/dL — ABNORMAL LOW (ref 6.5–8.1)

## 2021-04-05 LAB — GLUCOSE, CAPILLARY
Glucose-Capillary: 54 mg/dL — ABNORMAL LOW (ref 70–99)
Glucose-Capillary: 91 mg/dL (ref 70–99)

## 2021-04-05 LAB — CULTURE, BLOOD (ROUTINE X 2): Culture: NO GROWTH

## 2021-04-05 LAB — MAGNESIUM: Magnesium: 1.7 mg/dL (ref 1.7–2.4)

## 2021-04-05 LAB — PHOSPHORUS: Phosphorus: 3.1 mg/dL (ref 2.5–4.6)

## 2021-04-05 MED ORDER — NYSTATIN 100000 UNIT/ML MT SUSP: 5.0000 mL | Freq: Four times a day (QID) | OROMUCOSAL | 0 refills | Status: DC

## 2021-04-05 MED ORDER — FAMOTIDINE 20 MG PO TABS: 20.0000 mg | ORAL_TABLET | Freq: Two times a day (BID) | ORAL | 0 refills | Status: DC

## 2021-04-05 MED ORDER — INSULIN GLARGINE-YFGN 100 UNIT/ML ~~LOC~~ SOLN
12.0000 [IU] | Freq: Every day | SUBCUTANEOUS | Status: DC
Start: 2021-04-06 — End: 2021-04-05

## 2021-04-05 MED ORDER — SUCRALFATE 1 G PO TABS: 1.0000 g | ORAL_TABLET | Freq: Three times a day (TID) | ORAL | 0 refills | Status: DC

## 2021-04-05 MED ORDER — INSULIN ASPART 100 UNIT/ML IJ SOLN: 2.0000 [IU] | Freq: Three times a day (TID) | INTRAMUSCULAR | Status: DC

## 2021-04-05 MED ORDER — VANCOMYCIN HCL 125 MG PO CAPS: 125.0000 mg | ORAL_CAPSULE | Freq: Four times a day (QID) | ORAL | 0 refills | Status: DC

## 2021-04-05 MED ORDER — INSULIN ASPART 100 UNIT/ML IJ SOLN: 0.0000 [IU] | INTRAMUSCULAR | Status: DC

## 2021-04-05 NOTE — Discharge Summary (Signed)
Discharge Summary  Ashley Freeman [place] KXF:[id number] DOB: 06-30-1990  PCP: [name], PA-C  Admit date: 03/31/2021 Discharge date: 04/05/2021  Time spent: 40 mins  Recommendations for Outpatient Follow-up:  Follow-up with PCP in 1 week Follow-up with endocrinology  Discharge Diagnoses:  Active Hospital Problems   Diagnosis Date Noted   DKA (diabetic ketoacidosis) ([place]) 03/31/2021    Resolved Hospital Problems  No resolved problems to display.    Discharge Condition: Stable  Diet recommendation: Moderate carb  Vitals:   04/05/21 0525 04/05/21 1338  BP: 132/85 (!) 176/107  Pulse: 99 (!) 104  Resp: 18 18  Temp: 98.7 F (37.1 C) 98.3 F (36.8 C)  SpO2: 100% 99%    History of present illness:  30 year old female with past medical history of type 1 diabetes with chronic kidney disease and neuropathy presented to hospital on 04/01/2021 with elevated blood glucose levels.  Of note, she was recently admitted at [facility] hospital from 11/21 through 11/27 with DKA in the setting of COVID-19 and influenza infection.  On this presentation patient was noted to be hypothermic, tachycardic with low blood pressure and some mental confusion.  Patient's mother reported that she had been noncompliant with home insulin regimen, was reportedly on insulin pump that has not been used.  Initial venous blood gas showed pH of 6.9 with sodium of 116 and potassium of 5.4.  Bicarb was less than 7 with glucose of 1330.  BUN is elevated with a creatinine of 2.7 and lactate elevated at 5.2.  Patient did have leukocytosis with WBC of 57.8 and INR of 1.6.  Repeat influenza and COVID screening test was negative.  Chest x-ray showed mild left lower streaky opacities thought to be secondary to atelectasis versus early pneumonia.  Also had some diarrhea. Patient was given IV fluid resuscitation, IV insulin and was admitted to the hospital. During hospitalization, patient was treated with IV  insulin for DKA and has been started on p.o. vancomycin for C. difficile diarrhea.     Today, patient denies any new complaints, very eager to be discharged, still with poor appetite but reports dislike for hospital food.  Noted hypoglycemic event, insulin adjusted while in hospital.  Diarrhea has significantly resolved.  Patient reports chest discomfort has improved. Discussed with diabetes coordinator and myself, patient reports insulin pump is working and would resume it once home.  Extensive discussion with patient about the need for compliance to her insulin regimen, and discharge medications for C. difficile and possible reflux/esophagitis.  Patient verbalized understanding.  Recommend follow-up with PCP in 1 week and endocrinology as soon as possible.   Hospital Course:  Active Problems:   DKA (diabetic ketoacidosis) ([place])   Severe diabetic ketoacidosis Resolved Hemoglobin A1c was 10.5. Likely secondary to noncompliance with insulin in addition to c.diff infection Pt transitioned on long-acting and sliding scale insulin while in the hospital Diabetic coordinator has seen the patient and recommend re-starting insulin pump (functioning well)   Follow-up with PCP and endocrinology  Sepsis likely secondary to C. difficile diarrhea Improving Antigen positive but toxin negative, noted very high WBC count with watery diarrhea on presentation  WBC improving, as well as diarrhea Discharge on p.o. vancomycin for total of 10 days Follow-up with PCP   Intermittent episodes of chest discomfort/?GERD/esophagitis/possible candidiasis Reports some improvement Associated with meals, some odynophagia EKG  with normal findings, troponin normal Continue famotidine twice daily, nystatin swish and swallow, sucralfate Avoid PPI due to ongoing C. difficile infection Follow-up with PCP,  if no significant improvement, may need GI follow-up   Bacillus bacteremia Likely contaminant.  Was initially on  antibiotic which was discontinued.   Significant acute on chronic anemia Patient received 1 unit of packed RBC for hemoglobin of 6.6.  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  Secondary to diabetes.  Creatinine improving at 1.3 Follow-up with PCP/nephrology   Severe protein calorie malnutrition. Encourage oral nutrition   Debility, deconditioning Physical therapy has evaluated the patient and recommended home health on discharge.  Unfortunately, unable to find an agency for PT due to Medicaid status     Estimated body mass index is 24.17 kg/m as calculated from the following:   Height as of this encounter: '5\' 3"'  (1.6 m).   Weight as of this encounter: 61.9 kg.    Procedures: None  Consultations: PCCM  Discharge Exam: BP (!) 176/107 (BP Location: Left Arm)   Pulse (!) 104   Temp 98.3 F (36.8 C) (Oral)   Resp 18   Ht '5\' 3"'  (1.6 m)   Wt 61.9 kg   SpO2 99%   BMI 24.17 kg/m   General: NAD  Cardiovascular: S1, S2 present Respiratory: CTAB Abdomen: Soft, nontender, nondistended, bowel sounds present Musculoskeletal: No bilateral pedal edema noted Skin: Normal Psychiatry: Normal mood    Discharge Instructions You were cared for by a hospitalist during your hospital stay. If you have any questions about your discharge medications or the care you received while you were in the hospital after you are discharged, you can call the unit and asked to speak with the hospitalist on call if the hospitalist that took care of you is not available. Once you are discharged, your primary care physician will handle any further medical issues. Please note that NO REFILLS for any discharge medications will be authorized once you are discharged, as it is imperative that you return to your primary care physician (or establish a relationship with a primary care physician if you do not have one) for your aftercare needs so that they can  reassess your need for medications and monitor your lab values.   Allergies as of 04/05/2021       Reactions   Cephalexin Anaphylaxis   Penicillins Hives, Rash   Has patient had a PCN reaction causing immediate rash, facial/tongue/throat swelling, SOB or lightheadedness with hypotension: Yes Has patient had a PCN reaction causing severe rash involving mucus membranes or skin necrosis: No Has patient had a PCN reaction that required hospitalization: Yes Has patient had a PCN reaction occurring within the last 10 years: No Spoke with pt - childhood hives told by mom, tried no pcns since, doesn't remember reaction herself    Benadryl [diphenhydramine] Itching   Doxycycline Itching        Medication List     STOP taking these medications    oseltamivir 30 MG capsule Commonly known as: TAMIFLU       TAKE these medications    albuterol 108 (90 Base) MCG/ACT inhaler Commonly known as: VENTOLIN HFA Inhale 2 puffs into the lungs every 4 (four) hours as needed for wheezing or shortness of breath.   amLODipine 10 MG tablet Commonly known as: NORVASC Take 1 tablet (10 mg total) by mouth daily.   blood glucose meter kit and supplies Kit Dispense based on patient and insurance preference. Use up to four times daily as directed. (FOR ICD-9 250.00, 250.01).   Blood Pressure Kit Devi 1  Device by Does not apply route as needed.   Dexcom G6 Sensor Misc Inject 1 Device into the skin as directed.   Dexcom G6 Transmitter Misc Inject 1 Device into the skin as directed. Use to check blood sugar daily   famotidine 20 MG tablet Commonly known as: PEPCID Take 1 tablet (20 mg total) by mouth 2 (two) times daily.   insulin aspart 100 UNIT/ML FlexPen Commonly known as: NOVOLOG 0-9 Units, Subcutaneous, 3 times daily with meals, CBG < 70: Implement Hypoglycemia measures CBG 70 - 120: 0 units CBG 121 - 150: 1 unit CBG 151 - 200: 2 units CBG 201 - 250: 3 units CBG 251 - 300: 5 units CBG 301 -  350: 7 units CBG 351 - 400: 9 units CBG > 400: call MD   ipratropium 17 MCG/ACT inhaler Commonly known as: ATROVENT HFA Inhale 1 puff into the lungs every 6 (six) hours.   lamoTRIgine 150 MG tablet Commonly known as: LAMICTAL Take 150 mg by mouth daily. What changed: Another medication with the same name was removed. Continue taking this medication, and follow the directions you see here.   nystatin 100000 UNIT/ML suspension Commonly known as: MYCOSTATIN Take 5 mLs (500,000 Units total) by mouth 4 (four) times daily.   Omnipod 5 G6 Intro (Gen 5) Kit 1 Device by Does not apply route every 3 (three) days. What changed:  additional instructions Another medication with the same name was removed. Continue taking this medication, and follow the directions you see here.   Pentips 32G X 4 MM Misc Generic drug: Insulin Pen Needle Use as directed   sucralfate 1 g tablet Commonly known as: CARAFATE Take 1 tablet (1 g total) by mouth 4 (four) times daily -  with meals and at bedtime.   vancomycin 125 MG capsule Commonly known as: VANCOCIN Take 1 capsule (125 mg total) by mouth 4 (four) times daily for 5 days. Start taking on: April 06, 2021       Allergies  Allergen Reactions   Cephalexin Anaphylaxis   Penicillins Hives and Rash    Has patient had a PCN reaction causing immediate rash, facial/tongue/throat swelling, SOB or lightheadedness with hypotension: Yes Has patient had a PCN reaction causing severe rash involving mucus membranes or skin necrosis: No Has patient had a PCN reaction that required hospitalization: Yes Has patient had a PCN reaction occurring within the last 10 years: No Spoke with pt - childhood hives told by mom, tried no pcns since, doesn't remember reaction herself    Benadryl [Diphenhydramine] Itching   Doxycycline Itching    Follow-up Information     [name], PA-C Follow up.   Specialty: Physician Assistant Contact information: [place] [address] [phone]         Shamleffer, Melanie Crazier, MD. Schedule an appointment as soon as possible for a visit.   Specialties: Endocrinology, Radiology Contact information: [place] [address] [phone]                  The results of significant diagnostics from this hospitalization (including imaging, microbiology, ancillary and laboratory) are listed below for reference.    Significant Diagnostic Studies: DG Chest Port 1 View  Result Date: 04/01/2021 CLINICAL DATA:  DKA. Recent COVID. Left lower lobe atelectasis versus infiltrate. EXAM: PORTABLE CHEST 1 VIEW COMPARISON:  03/31/2021 FINDINGS: Increased hazy densities in the left lower lung. Slightly increased interstitial densities in both  lungs. Heart size is within normal limits and stable. Negative for a pneumothorax. IMPRESSION: 1. Hazy densities in left lower chest. Findings could represent pleural fluid or atelectasis. 2. Concern for mild interstitial pulmonary edema. Electronically Signed   By: Markus Daft M.D.   On: 04/01/2021 09:03   DG CHEST PORT 1 VIEW  Result Date: 03/31/2021 CLINICAL DATA:  Shortness of breath EXAM: PORTABLE CHEST 1 VIEW COMPARISON:  03/31/2021 FINDINGS: The heart size and mediastinal contours are within normal limits. Both lungs are clear. The visualized skeletal structures are unremarkable. IMPRESSION: No active disease. Electronically Signed   By: Jerilynn Mages.  Shick M.D.   On: 03/31/2021 15:49   DG Chest Portable 1 View  Result Date: 03/31/2021 CLINICAL DATA:  Diabetic ketoacidosis EXAM: PORTABLE CHEST 1 VIEW COMPARISON:  Prior chest x-ray 03/20/2021 FINDINGS: Low inspiratory volumes. Streaky opacities in the left lower lobe favored to reflect atelectasis. Cardiac and mediastinal contours are normal. No pleural effusion or pneumothorax. No acute osseous abnormality. IMPRESSION: Streaky airspace opacities in the left lower lobe favored to  reflect atelectasis. Early bronchopneumonia is difficult to exclude entirely. Recommend dedicated PA and lateral chest x-ray when the patient is able. Electronically Signed   By: Jacqulynn Cadet M.D.   On: 03/31/2021 11:11   DG Chest Portable 1 View  Result Date: 03/20/2021 CLINICAL DATA:  Acidosis.  COVID positive. EXAM: PORTABLE CHEST 1 VIEW COMPARISON:  Radiograph 03/07/2021 FINDINGS: The cardiomediastinal contours are normal. The lungs are clear. Pulmonary vasculature is normal. No consolidation, pleural effusion, or pneumothorax. No acute osseous abnormalities are seen. IMPRESSION: No acute chest findings. Electronically Signed   By: Keith Rake M.D.   On: 03/20/2021 22:57   DG Chest Portable 1 View  Result Date: 03/07/2021 CLINICAL DATA:  Chest pain. EXAM: PORTABLE CHEST 1 VIEW COMPARISON:  December 07, 2020. FINDINGS: The heart size and mediastinal contours are within normal limits. Both lungs are clear. The visualized skeletal structures are unremarkable. IMPRESSION: No active disease. Electronically Signed   By: Marijo Conception M.D.   On: 03/07/2021 10:28   DG Abd Portable 1V  Result Date: 04/01/2021 CLINICAL DATA:  C difficile colitis EXAM: PORTABLE ABDOMEN - 1 VIEW COMPARISON:  2013 FINDINGS: The bowel gas pattern is normal. No radio-opaque calculi or other significant radiographic abnormality are seen. IMPRESSION: Negative. Electronically Signed   By: Macy Mis M.D.   On: 04/01/2021 10:02   ECHOCARDIOGRAM COMPLETE  Result Date: 04/01/2021    ECHOCARDIOGRAM REPORT   Patient Name:   Ashley Freeman [facility] Date of Exam: 04/01/2021 Medical Rec #:  [id number]                    Height:       63.0 in Accession #:    [id number]                   Weight:       142.2 lb Date of Birth:  08-May-1990                     BSA:          1.673 m Patient Age:    30 years                     BP:           140/59 mmHg Patient Gender: F  HR:           104 bpm. Exam  Location:  Inpatient Procedure: 2D Echo, Cardiac Doppler and Color Doppler Indications:    Cardiomyopathy-Unspecified I42.9  History:        Patient has prior history of Echocardiogram examinations, most                 recent 12/08/2020. Risk Factors:Diabetes and Dyslipidemia.  Sonographer:    Bernadene Person RDCS Referring Phys: [place]  1. Left ventricular ejection fraction, by estimation, is 65 to 70%. The left ventricle has normal function. The left ventricle has no regional wall motion abnormalities. Left ventricular diastolic parameters were normal.  2. Right ventricular systolic function is normal. The right ventricular size is normal. There is mildly elevated pulmonary artery systolic pressure.  3. The mitral valve is normal in structure. Trivial mitral valve regurgitation.  4. The aortic valve is normal in structure. Aortic valve regurgitation is not visualized. No aortic stenosis is present. FINDINGS  Left Ventricle: Left ventricular ejection fraction, by estimation, is 65 to 70%. The left ventricle has normal function. The left ventricle has no regional wall motion abnormalities. The left ventricular internal cavity size was small. There is no left ventricular hypertrophy. Left ventricular diastolic parameters were normal. Right Ventricle: The right ventricular size is normal. Right vetricular wall thickness was not well visualized. Right ventricular systolic function is normal. There is mildly elevated pulmonary artery systolic pressure. The tricuspid regurgitant velocity  is 2.94 m/s, and with an assumed right atrial pressure of 3 mmHg, the estimated right ventricular systolic pressure is 38.9 mmHg. Left Atrium: Left atrial size was normal in size. Right Atrium: Right atrial size was normal in size. Pericardium: There is no evidence of pericardial effusion. Mitral Valve: The mitral valve is normal in structure. Trivial mitral valve regurgitation. Tricuspid Valve: The tricuspid  valve is normal in structure. Tricuspid valve regurgitation is mild. Aortic Valve: The aortic valve is normal in structure. Aortic valve regurgitation is not visualized. No aortic stenosis is present. Pulmonic Valve: The pulmonic valve was grossly normal. Pulmonic valve regurgitation is trivial. Aorta: The aortic root and ascending aorta are structurally normal, with no evidence of dilitation. IAS/Shunts: The interatrial septum was not well visualized.  LEFT VENTRICLE PLAX 2D LVIDd:         3.80 cm     Diastology LVIDs:         2.70 cm     LV e' medial:    8.43 cm/s LV PW:         0.80 cm     LV E/e' medial:  12.6 LV IVS:        0.70 cm     LV e' lateral:   10.30 cm/s LVOT diam:     1.80 cm     LV E/e' lateral: 10.3 LV SV:         42 LV SV Index:   25 LVOT Area:     2.54 cm  LV Volumes (MOD) LV vol d, MOD A2C: 67.6 ml LV vol d, MOD A4C: 77.5 ml LV vol s, MOD A2C: 29.6 ml LV vol s, MOD A4C: 32.4 ml LV SV MOD A2C:     38.0 ml LV SV MOD A4C:     77.5 ml LV SV MOD BP:      42.6 ml RIGHT VENTRICLE RV S prime:     13.60 cm/s TAPSE (M-mode): 2.4 cm LEFT ATRIUM  Index        RIGHT ATRIUM          Index LA diam:        1.80 cm 1.08 cm/m   RA Area:     9.32 cm LA Vol (A2C):   19.6 ml 11.72 ml/m  RA Volume:   17.50 ml 10.46 ml/m LA Vol (A4C):   21.6 ml 12.91 ml/m LA Biplane Vol: 21.0 ml 12.55 ml/m  AORTIC VALVE LVOT Vmax:   109.00 cm/s LVOT Vmean:  74.900 cm/s LVOT VTI:    0.166 m  AORTA Ao Root diam: 2.60 cm Ao Asc diam:  2.70 cm MITRAL VALVE                TRICUSPID VALVE MV Area (PHT): 5.62 cm     TR Peak grad:   34.6 mmHg MV Decel Time: 135 msec     TR Vmax:        294.00 cm/s MV E velocity: 106.00 cm/s MV A velocity: 104.00 cm/s  SHUNTS MV E/A ratio:  1.02         Systemic VTI:  0.17 m                             Systemic Diam: 1.80 cm Mertie Moores MD Electronically signed by Mertie Moores MD Signature Date/Time: 04/01/2021/4:21:52 PM    Final     Microbiology: Recent Results (from the past 240  hour(s))  Resp Panel by RT-PCR (Flu A&B, Covid) Nasopharyngeal Swab     Status: None   Collection Time: 03/31/21 10:45 AM   Specimen: Nasopharyngeal Swab; Nasopharyngeal(NP) swabs in vial transport medium  Result Value Ref Range Status   SARS Coronavirus 2 by RT PCR NEGATIVE NEGATIVE Final    Comment: (NOTE) SARS-CoV-2 target nucleic acids are NOT DETECTED.  The SARS-CoV-2 RNA is generally detectable in upper respiratory specimens during the acute phase of infection. The lowest concentration of SARS-CoV-2 viral copies this assay can detect is 138 copies/mL. A negative result does not preclude SARS-Cov-2 infection and should not be used as the sole basis for treatment or other patient management decisions. A negative result may occur with  improper specimen collection/handling, submission of specimen other than nasopharyngeal swab, presence of viral mutation(s) within the areas targeted by this assay, and inadequate number of viral copies(<138 copies/mL). A negative result must be combined with clinical observations, patient history, and epidemiological information. The expected result is Negative.  Fact Sheet for Patients:  EntrepreneurPulse.com.au  Fact Sheet for Healthcare Providers:  IncredibleEmployment.be  This test is no t yet approved or cleared by the [place] FDA and  has been authorized for detection and/or diagnosis of SARS-CoV-2 by FDA under an Emergency Use Authorization (EUA). This EUA will remain  in effect (meaning this test can be used) for the duration of the COVID-19 declaration under Section 564(b)(1) of the Act, 21 U.S.C.section 360bbb-3(b)(1), unless the authorization is terminated  or revoked sooner.       Influenza A by PCR NEGATIVE NEGATIVE Final   Influenza B by PCR NEGATIVE NEGATIVE Final    Comment: (NOTE) The Xpert Xpress SARS-CoV-2/FLU/RSV plus assay is intended as an aid in the diagnosis of influenza from  Nasopharyngeal swab specimens and should not be used as a sole basis for treatment. Nasal washings and aspirates are unacceptable for Xpert Xpress SARS-CoV-2/FLU/RSV testing.  Fact Sheet for Patients: EntrepreneurPulse.com.au  Fact Sheet for Healthcare Providers: IncredibleEmployment.be  This test is not yet approved or cleared by the [place] and has been authorized for detection and/or diagnosis of SARS-CoV-2 by FDA under an Emergency Use Authorization (EUA). This EUA will remain in effect (meaning this test can be used) for the duration of the COVID-19 declaration under Section 564(b)(1) of the Act, 21 U.S.C. section 360bbb-3(b)(1), unless the authorization is terminated or revoked.  Performed at [facility], [place] [address], [place], [place] [address]   Blood culture (routine x 2)     Status: Abnormal   Collection Time: 03/31/21 10:47 AM   Specimen: BLOOD  Result Value Ref Range Status   Specimen Description   Final    BLOOD RIGHT ARM Performed at [facility] [address] [place], [place] [address]    Special Requests   Final    BOTTLES DRAWN AEROBIC AND ANAEROBIC Blood Culture adequate volume Performed at [place] [address] [place], [place] [address]    Culture  Setup Time (A)  Final    GRAM VARIABLE ROD AEROBIC BOTTLE ONLY CRITICAL RESULT CALLED TO, READ BACK BY AND VERIFIED WITH: PHARMD D.WOFFORD AT 0930 ON 04/01/2021 BY T.SAAD.    Culture (A)  Final    BACILLUS SPECIES Standardized susceptibility testing for this organism is not available. Performed at [facility], [place] [address], [place], [place] [address]    Report Status 04/03/2021 FINAL  Final  Blood Culture ID Panel (Reflexed)     Status: None   Collection Time: 03/31/21 10:47 AM  Result Value Ref Range Status   Enterococcus faecalis NOT DETECTED NOT DETECTED Final   Enterococcus  Faecium NOT DETECTED NOT DETECTED Final   Listeria monocytogenes NOT DETECTED NOT DETECTED Final   Staphylococcus species NOT DETECTED NOT DETECTED Final   Staphylococcus aureus (BCID) NOT DETECTED NOT DETECTED Final   Staphylococcus epidermidis NOT DETECTED NOT DETECTED Final   Staphylococcus lugdunensis NOT DETECTED NOT DETECTED Final   Streptococcus species NOT DETECTED NOT DETECTED Final   Streptococcus agalactiae NOT DETECTED NOT DETECTED Final   Streptococcus pneumoniae NOT DETECTED NOT DETECTED Final   Streptococcus pyogenes NOT DETECTED NOT DETECTED Final   A.calcoaceticus-baumannii NOT DETECTED NOT DETECTED Final   Bacteroides fragilis NOT DETECTED NOT DETECTED Final   Enterobacterales NOT DETECTED NOT DETECTED Final   Enterobacter cloacae complex NOT DETECTED NOT DETECTED Final   Escherichia coli NOT DETECTED NOT DETECTED Final   Klebsiella aerogenes NOT DETECTED NOT DETECTED Final   Klebsiella oxytoca NOT DETECTED NOT DETECTED Final   Klebsiella pneumoniae NOT DETECTED NOT DETECTED Final   Proteus species NOT DETECTED NOT DETECTED Final   Salmonella species NOT DETECTED NOT DETECTED Final   Serratia marcescens NOT DETECTED NOT DETECTED Final   Haemophilus influenzae NOT DETECTED NOT DETECTED Final   Neisseria meningitidis NOT DETECTED NOT DETECTED Final   Pseudomonas aeruginosa NOT DETECTED NOT DETECTED Final   Stenotrophomonas maltophilia NOT DETECTED NOT DETECTED Final   Candida albicans NOT DETECTED NOT DETECTED Final   Candida auris NOT DETECTED NOT DETECTED Final   Candida glabrata NOT DETECTED NOT DETECTED Final   Candida krusei NOT DETECTED NOT DETECTED Final   Candida parapsilosis NOT DETECTED NOT DETECTED Final   Candida tropicalis NOT DETECTED NOT DETECTED Final   Cryptococcus neoformans/gattii NOT DETECTED NOT DETECTED Final    Comment: Performed at [facility], [place] [address] [place], [place] [address]  Blood culture (routine x 2)  Status: None    Collection Time: 03/31/21 10:55 AM   Specimen: BLOOD  Result Value Ref Range Status   Specimen Description   Final    BLOOD LEFT ARM Performed at [place] [facility] [address], [place], [place] [address]    Special Requests   Final    BOTTLES DRAWN AEROBIC AND ANAEROBIC Blood Culture results may not be optimal due to an inadequate volume of blood received in culture bottles Performed at [place] [address], [place], [place] [address]    Culture   Final    NO GROWTH 5 DAYS Performed at [facility], [place] [address] [place], [place] [address]    Report Status 04/05/2021 FINAL  Final  Urine Culture     Status: None   Collection Time: 03/31/21  1:07 PM   Specimen: Urine, Clean Catch  Result Value Ref Range Status   Specimen Description   Final    URINE, CLEAN CATCH Performed at [facility], [place] [address], [place], [place] [address]    Special Requests   Final    NONE Performed at [facility], [place] [address] [place], [place] [address]    Culture   Final    NO GROWTH Performed at [facility], [place] [address] [place], [place] [address]    Report Status 04/02/2021 FINAL  Final  C Difficile Quick Screen w PCR reflex     Status: Abnormal   Collection Time: 03/31/21  5:08 PM   Specimen: STOOL  Result Value Ref Range Status   C Diff antigen POSITIVE (A) NEGATIVE Final   C Diff toxin NEGATIVE NEGATIVE Final   C Diff interpretation Results are indeterminate. See PCR results.  Final    Comment: Performed at [facility], [place] [address], [place], [place] [address]  C. Diff by PCR, Reflexed     Status: Abnormal   Collection Time: 03/31/21  5:08 PM  Result Value Ref Range Status   Toxigenic C. Difficile by PCR POSITIVE (A) NEGATIVE Final    Comment: Positive for toxigenic C. difficile with little to no toxin production. Only treat if clinical presentation suggests  symptomatic illness. Performed at [facility], [place] [address], [place], [place] [address]   MRSA Next Gen by PCR, Nasal     Status: None   Collection Time: 04/01/21 11:10 AM   Specimen: Nasal Mucosa; Nasal Swab  Result Value Ref Range Status   MRSA by PCR Next Gen NOT DETECTED NOT DETECTED Final    Comment: (NOTE) The GeneXpert MRSA Assay (FDA approved for NASAL specimens only), is one component of a comprehensive MRSA colonization surveillance program. It is not intended to diagnose MRSA infection nor to guide or monitor treatment for MRSA infections. Test performance is not FDA approved in patients less than 39 years old. Performed at [facility], [place] [address] [place], [place] [address]      Labs: Basic Metabolic Panel: Recent Labs  Lab 04/01/21 2016 04/02/21 0250 04/03/21 0017 04/03/21 1119 04/04/21 0925 04/05/21 0353  NA 133* 133* 134* 134* 135 135  K 3.6 3.6 3.5 3.4* 3.8 3.6  CL 110 110 110 109 110 111  CO2 16* 17* 18* 20* 19* 21*  GLUCOSE 184* 130* 117* 176* 135* 60*  BUN 28* 23* 21* '20 19 16  ' CREATININE 2.08* 1.94* 1.71* 1.58* 1.38* 1.36*  CALCIUM 7.7* 7.3* 7.5* 7.7* 7.5* 7.5*  MG 2.3 2.1  --  1.8 1.6* 1.7  PHOS 1.8* 1.8*  --  1.9* 3.5 3.1   Liver Function Tests: Recent Labs  Lab 04/02/21 0250 04/03/21 0017 04/03/21 1119 04/04/21 0925 04/05/21 0353  AST '20 28 23 15 16  ' ALT '16 17 17 14 12  ' ALKPHOS 108 111 119 103 94  BILITOT 0.5 0.5 0.4 0.5 0.5  PROT 4.1* 4.7* 4.9* 4.8* 4.3*  ALBUMIN <1.5* 1.6* 1.7* 1.6* 1.6*   Recent Labs  Lab 03/31/21 1551  LIPASE 25  AMYLASE 11*   No results for input(s): AMMONIA in the last 168 hours. CBC: Recent Labs  Lab 04/02/21 0250 04/03/21 0017 04/03/21 1119 04/04/21 0925 04/05/21 0353  WBC 28.4* 23.2* 20.3* 15.5* 13.3*  HGB 8.4* 8.8* 9.3* 9.0* 7.7*  HCT 24.4* 25.9* 28.0* 26.9* 23.8*  MCV 79.5* 80.7 81.6 81.8 83.8  PLT 309 285 310 275 272   Cardiac Enzymes: No results for  input(s): CKTOTAL, CKMB, CKMBINDEX, TROPONINI in the last 168 hours. BNP: BNP (last 3 results) No results for input(s): BNP in the last 8760 hours.  ProBNP (last 3 results) No results for input(s): PROBNP in the last 8760 hours.  CBG: Recent Labs  Lab 04/04/21 1149 04/04/21 1618 04/04/21 2007 04/05/21 0735 04/05/21 1142  GLUCAP 170* 90 116* 54* 91       Signed:  Alma Friendly, MD [facility] 04/05/2021, 2:30 PM

## 2021-04-05 NOTE — Plan of Care (Signed)
  Problem: Clinical Measurements: Goal: Respiratory complications will improve Outcome: Adequate for Discharge   Problem: Clinical Measurements: Goal: Cardiovascular complication will be avoided Outcome: Adequate for Discharge   Problem: Activity: Goal: Risk for activity intolerance will decrease Outcome: Adequate for Discharge   Problem: Nutrition: Goal: Adequate nutrition will be maintained Outcome: Adequate for Discharge   Problem: Pain Managment: Goal: General experience of comfort will improve Outcome: Adequate for Discharge   Problem: Elimination: Goal: Will not experience complications related to bowel motility Outcome: Adequate for Discharge

## 2021-04-05 NOTE — Progress Notes (Addendum)
Inpatient Diabetes Program Recommendations  AACE/ADA: New Consensus Statement on Inpatient Glycemic Control (2015)  Target Ranges:  Prepandial:   less than 140 mg/dL      Peak postprandial:   less than 180 mg/dL (1-2 hours)      Critically ill patients:  140 - 180 mg/dL   Lab Results  Component Value Date   GLUCAP 54 (L) 04/05/2021   HGBA1C 10.5 (H) 03/07/2021    Review of Glycemic Control  Latest Reference Range & Units 04/04/21 07:44 04/04/21 11:49 04/04/21 16:18 04/04/21 20:07 04/05/21 07:35  Glucose-Capillary 70 - 99 mg/dL 123 (H) 170 (H) 90 116 (H) 54 (L)   Diabetes history: DM type 1 Outpatient Diabetes medications: OmniPod insulin pump with Novolog 0-9  TID with meals and 0-5 HS + 3 units TID Current orders for Inpatient glycemic control: Lantus 15 units QD, Novolog 0-9 units tid + hs, Novolog 3 units tid meal coverage  Inpatient Diabetes Program Recommendations:    Pt having hypoglycemia  -   Reduce Semglee to 12 units (pt takes 13 in her insulin pump) -   Reduce Novolog meal coverage to 2 units tid -   Reduce Novolog Correction to 0-6 units tid "very sensitive"  Spoke with pt at bedside regarding insulin pump. Pt reports its in good working order and glucose was fine until she took it off when she felt sick. She does have back up insulin pens at home of lantus and Novolog if needed. She was so sick on admission she can't remember if she used her SQ injections. Pt has plans to put her insulin pump back on in the am and just correct her glucose and cover her carbs with her SQ Novolog pen tonight. Pt very eager to be d/c'd and go home to her 28 year old daughter.  Thanks,  Tama Headings RN, MSN, BC-ADM Inpatient Diabetes Coordinator Team Pager [phone] (8a-5p)

## 2021-04-05 NOTE — TOC Transition Note (Addendum)
Transition of Care [facility]) - CM/SW Discharge Note   Patient Details  Name: Ashley Freeman MRN: [id number] Date of Birth: March 28, 1991  [facility] CM/SW Contact:  [name], RN Phone Number: 04/05/2021, 2:33 PM   Clinical Narrative:    Dc to return home with self care. Pt is recommending [facility], pt has medicaid there are no agencies that will take medicaid. Have called [facility], [facility], [facility],and [facility] none can take. Final next level of care: Home/Self Care Barriers to Discharge: No Barriers Identified   Patient Goals and CMS Choice Patient states their goals for this hospitalization and ongoing recovery are:: to go home CMS Medicare.gov Compare Post Acute Care list provided to:: Patient Choice offered to / list presented to : Patient  Discharge Placement                       Discharge Plan and Services   Discharge Planning Services: CM Consult                                 Social Determinants of Health (SDOH) Interventions     Readmission Risk Interventions No flowsheet data found.

## 2021-04-05 NOTE — Progress Notes (Signed)
Pt alert, c/o chest pain pt states she wants to eat but the pain is there like stabbing her chest. NP Olena Heckle notified. Given morphine 1 mg and will continue plan of care.

## 2021-04-08 ENCOUNTER — Inpatient Hospital Stay: Payer: Self-pay

## 2021-04-08 ENCOUNTER — Other Ambulatory Visit: Payer: Self-pay

## 2021-04-08 ENCOUNTER — Inpatient Hospital Stay (HOSPITAL_COMMUNITY)
Admission: EM | Admit: 2021-04-08 | Discharge: 2021-04-10 | DRG: 638 | Disposition: A | Discharge status: Other Acute Inpatient Hospital | Payer: Medicaid Other | Attending: Internal Medicine | Admitting: Internal Medicine

## 2021-04-08 ENCOUNTER — Encounter (HOSPITAL_COMMUNITY): Payer: Self-pay

## 2021-04-08 DIAGNOSIS — R079 Chest pain, unspecified: Secondary | ICD-10-CM | POA: Diagnosis present

## 2021-04-08 DIAGNOSIS — R197 Diarrhea, unspecified: Secondary | ICD-10-CM | POA: Diagnosis not present

## 2021-04-08 DIAGNOSIS — K3184 Gastroparesis: Secondary | ICD-10-CM | POA: Diagnosis present

## 2021-04-08 DIAGNOSIS — T68XXXA Hypothermia, initial encounter: Secondary | ICD-10-CM | POA: Diagnosis present

## 2021-04-08 DIAGNOSIS — Z880 Allergy status to penicillin: Secondary | ICD-10-CM

## 2021-04-08 DIAGNOSIS — E785 Hyperlipidemia, unspecified: Secondary | ICD-10-CM | POA: Diagnosis present

## 2021-04-08 DIAGNOSIS — K611 Rectal abscess: Secondary | ICD-10-CM | POA: Diagnosis present

## 2021-04-08 DIAGNOSIS — I214 Non-ST elevation (NSTEMI) myocardial infarction: Secondary | ICD-10-CM | POA: Diagnosis not present

## 2021-04-08 DIAGNOSIS — N1831 Chronic kidney disease, stage 3a: Secondary | ICD-10-CM | POA: Diagnosis present

## 2021-04-08 DIAGNOSIS — R0789 Other chest pain: Secondary | ICD-10-CM

## 2021-04-08 DIAGNOSIS — Z888 Allergy status to other drugs, medicaments and biological substances status: Secondary | ICD-10-CM

## 2021-04-08 DIAGNOSIS — I129 Hypertensive chronic kidney disease with stage 1 through stage 4 chronic kidney disease, or unspecified chronic kidney disease: Secondary | ICD-10-CM | POA: Diagnosis present

## 2021-04-08 DIAGNOSIS — E1043 Type 1 diabetes mellitus with diabetic autonomic (poly)neuropathy: Secondary | ICD-10-CM | POA: Diagnosis present

## 2021-04-08 DIAGNOSIS — I959 Hypotension, unspecified: Secondary | ICD-10-CM | POA: Diagnosis present

## 2021-04-08 DIAGNOSIS — I248 Other forms of acute ischemic heart disease: Secondary | ICD-10-CM | POA: Diagnosis present

## 2021-04-08 DIAGNOSIS — Z9641 Presence of insulin pump (external) (internal): Secondary | ICD-10-CM | POA: Diagnosis present

## 2021-04-08 DIAGNOSIS — Z20822 Contact with and (suspected) exposure to covid-19: Secondary | ICD-10-CM | POA: Diagnosis present

## 2021-04-08 DIAGNOSIS — I10 Essential (primary) hypertension: Secondary | ICD-10-CM | POA: Diagnosis not present

## 2021-04-08 DIAGNOSIS — Z8616 Personal history of COVID-19: Secondary | ICD-10-CM

## 2021-04-08 DIAGNOSIS — R000 Tachycardia, unspecified: Secondary | ICD-10-CM | POA: Diagnosis present

## 2021-04-08 DIAGNOSIS — Z794 Long term (current) use of insulin: Secondary | ICD-10-CM

## 2021-04-08 DIAGNOSIS — K219 Gastro-esophageal reflux disease without esophagitis: Secondary | ICD-10-CM | POA: Diagnosis present

## 2021-04-08 DIAGNOSIS — E1010 Type 1 diabetes mellitus with ketoacidosis without coma: Secondary | ICD-10-CM | POA: Diagnosis present

## 2021-04-08 DIAGNOSIS — R778 Other specified abnormalities of plasma proteins: Secondary | ICD-10-CM | POA: Diagnosis not present

## 2021-04-08 DIAGNOSIS — T79A11A Traumatic compartment syndrome of right upper extremity, initial encounter: Secondary | ICD-10-CM | POA: Diagnosis present

## 2021-04-08 DIAGNOSIS — Z91199 Patient's noncompliance with other medical treatment and regimen due to unspecified reason: Secondary | ICD-10-CM

## 2021-04-08 DIAGNOSIS — T85694A Other mechanical complication of insulin pump, initial encounter: Secondary | ICD-10-CM | POA: Diagnosis present

## 2021-04-08 DIAGNOSIS — E1110 Type 2 diabetes mellitus with ketoacidosis without coma: Secondary | ICD-10-CM | POA: Diagnosis present

## 2021-04-08 DIAGNOSIS — M609 Myositis, unspecified: Secondary | ICD-10-CM | POA: Diagnosis present

## 2021-04-08 DIAGNOSIS — E1022 Type 1 diabetes mellitus with diabetic chronic kidney disease: Secondary | ICD-10-CM | POA: Diagnosis present

## 2021-04-08 DIAGNOSIS — Z560 Unemployment, unspecified: Secondary | ICD-10-CM

## 2021-04-08 DIAGNOSIS — Z833 Family history of diabetes mellitus: Secondary | ICD-10-CM | POA: Diagnosis not present

## 2021-04-08 DIAGNOSIS — I96 Gangrene, not elsewhere classified: Secondary | ICD-10-CM | POA: Diagnosis present

## 2021-04-08 DIAGNOSIS — A0472 Enterocolitis due to Clostridium difficile, not specified as recurrent: Secondary | ICD-10-CM | POA: Diagnosis present

## 2021-04-08 DIAGNOSIS — Z79899 Other long term (current) drug therapy: Secondary | ICD-10-CM

## 2021-04-08 DIAGNOSIS — Z881 Allergy status to other antibiotic agents status: Secondary | ICD-10-CM

## 2021-04-08 DIAGNOSIS — T383X6A Underdosing of insulin and oral hypoglycemic [antidiabetic] drugs, initial encounter: Secondary | ICD-10-CM | POA: Diagnosis present

## 2021-04-08 DIAGNOSIS — Z452 Encounter for adjustment and management of vascular access device: Secondary | ICD-10-CM

## 2021-04-08 DIAGNOSIS — E860 Dehydration: Secondary | ICD-10-CM | POA: Diagnosis present

## 2021-04-08 DIAGNOSIS — M60831 Other myositis, right forearm: Secondary | ICD-10-CM | POA: Diagnosis not present

## 2021-04-08 DIAGNOSIS — D539 Nutritional anemia, unspecified: Secondary | ICD-10-CM | POA: Diagnosis present

## 2021-04-08 DIAGNOSIS — N179 Acute kidney failure, unspecified: Secondary | ICD-10-CM | POA: Diagnosis present

## 2021-04-08 DIAGNOSIS — R072 Precordial pain: Secondary | ICD-10-CM | POA: Diagnosis not present

## 2021-04-08 DIAGNOSIS — Z825 Family history of asthma and other chronic lower respiratory diseases: Secondary | ICD-10-CM

## 2021-04-08 DIAGNOSIS — M79631 Pain in right forearm: Secondary | ICD-10-CM | POA: Diagnosis present

## 2021-04-08 LAB — BASIC METABOLIC PANEL
Anion gap: 18 — ABNORMAL HIGH (ref 5–15)
Anion gap: 12 (ref 5–15)
BUN: 27 mg/dL — ABNORMAL HIGH (ref 6–20)
BUN: 27 mg/dL — ABNORMAL HIGH (ref 6–20)
BUN: 28 mg/dL — ABNORMAL HIGH (ref 6–20)
BUN: 26 mg/dL — ABNORMAL HIGH (ref 6–20)
CO2: <7 mmol/L — ABNORMAL LOW (ref 22–32)
CO2: <7 mmol/L — ABNORMAL LOW (ref 22–32)
CO2: 8 mmol/L — ABNORMAL LOW (ref 22–32)
CO2: 15 mmol/L — ABNORMAL LOW (ref 22–32)
Calcium: 8.2 mg/dL — ABNORMAL LOW (ref 8.9–10.3)
Calcium: 7.9 mg/dL — ABNORMAL LOW (ref 8.9–10.3)
Calcium: 8.2 mg/dL — ABNORMAL LOW (ref 8.9–10.3)
Calcium: 8 mg/dL — ABNORMAL LOW (ref 8.9–10.3)
Chloride: 98 mmol/L (ref 98–111)
Chloride: 101 mmol/L (ref 98–111)
Chloride: 105 mmol/L (ref 98–111)
Chloride: 109 mmol/L (ref 98–111)
Creatinine, Ser: 2.78 mg/dL — ABNORMAL HIGH (ref 0.44–1.00)
Creatinine, Ser: 2.68 mg/dL — ABNORMAL HIGH (ref 0.44–1.00)
Creatinine, Ser: 2.78 mg/dL — ABNORMAL HIGH (ref 0.44–1.00)
Creatinine, Ser: 2.7 mg/dL — ABNORMAL HIGH (ref 0.44–1.00)
GFR, Estimated: 23 mL/min — ABNORMAL LOW (ref >60)
GFR, Estimated: 24 mL/min — ABNORMAL LOW (ref >60)
GFR, Estimated: 23 mL/min — ABNORMAL LOW (ref >60)
GFR, Estimated: 24 mL/min — ABNORMAL LOW (ref >60)
Glucose, Bld: 1191 mg/dL (ref 70–99)
Glucose, Bld: 1123 mg/dL (ref 70–99)
Glucose, Bld: 963 mg/dL (ref 70–99)
Glucose, Bld: 488 mg/dL — ABNORMAL HIGH (ref 70–99)
Potassium: 5.9 mmol/L — ABNORMAL HIGH (ref 3.5–5.1)
Potassium: 4.7 mmol/L (ref 3.5–5.1)
Potassium: 4 mmol/L (ref 3.5–5.1)
Potassium: 3.2 mmol/L — ABNORMAL LOW (ref 3.5–5.1)
Sodium: 126 mmol/L — ABNORMAL LOW (ref 135–145)
Sodium: 128 mmol/L — ABNORMAL LOW (ref 135–145)
Sodium: 131 mmol/L — ABNORMAL LOW (ref 135–145)
Sodium: 136 mmol/L (ref 135–145)

## 2021-04-08 LAB — BETA-HYDROXYBUTYRIC ACID
Beta-Hydroxybutyric Acid: >8 mmol/L — ABNORMAL HIGH (ref 0.05–0.27)
Beta-Hydroxybutyric Acid: 6.26 mmol/L — ABNORMAL HIGH (ref 0.05–0.27)
Beta-Hydroxybutyric Acid: 0.97 mmol/L — ABNORMAL HIGH (ref 0.05–0.27)

## 2021-04-08 LAB — TROPONIN I (HIGH SENSITIVITY)
Troponin I (High Sensitivity): 496 ng/L (ref <18)
Troponin I (High Sensitivity): 811 ng/L (ref <18)
Troponin I (High Sensitivity): 1071 ng/L (ref <18)
Troponin I (High Sensitivity): 1032 ng/L (ref <18)

## 2021-04-08 LAB — HEMOGLOBIN AND HEMATOCRIT, BLOOD
HCT: 23.1 % — ABNORMAL LOW (ref 36.0–46.0)
Hemoglobin: 7.2 g/dL — ABNORMAL LOW (ref 12.0–15.0)

## 2021-04-08 LAB — RESP PANEL BY RT-PCR (FLU A&B, COVID) ARPGX2
Influenza A by PCR: NEGATIVE
Influenza B by PCR: NEGATIVE
SARS Coronavirus 2 by RT PCR: NEGATIVE

## 2021-04-08 LAB — GLUCOSE, CAPILLARY
Glucose-Capillary: >600 mg/dL (ref 70–99)
Glucose-Capillary: >600 mg/dL (ref 70–99)
Glucose-Capillary: >600 mg/dL (ref 70–99)
Glucose-Capillary: >600 mg/dL (ref 70–99)
Glucose-Capillary: >600 mg/dL (ref 70–99)
Glucose-Capillary: 521 mg/dL (ref 70–99)
Glucose-Capillary: 543 mg/dL (ref 70–99)
Glucose-Capillary: 479 mg/dL — ABNORMAL HIGH (ref 70–99)
Glucose-Capillary: 518 mg/dL (ref 70–99)
Glucose-Capillary: 404 mg/dL — ABNORMAL HIGH (ref 70–99)
Glucose-Capillary: 450 mg/dL — ABNORMAL HIGH (ref 70–99)
Glucose-Capillary: 347 mg/dL — ABNORMAL HIGH (ref 70–99)

## 2021-04-08 LAB — CBC WITH DIFFERENTIAL/PLATELET
Abs Immature Granulocytes: 2.49 10*3/uL — ABNORMAL HIGH (ref 0.00–0.07)
Basophils Absolute: 0.1 10*3/uL (ref 0.0–0.1)
Basophils Relative: 1 %
Eosinophils Absolute: 0 10*3/uL (ref 0.0–0.5)
Eosinophils Relative: 0 %
HCT: 28.8 % — ABNORMAL LOW (ref 36.0–46.0)
Hemoglobin: 7.6 g/dL — ABNORMAL LOW (ref 12.0–15.0)
Immature Granulocytes: 12 %
Lymphocytes Relative: 13 %
Lymphs Abs: 2.6 10*3/uL (ref 0.7–4.0)
MCH: 27 pg (ref 26.0–34.0)
MCHC: 26.4 g/dL — ABNORMAL LOW (ref 30.0–36.0)
MCV: 102.1 fL — ABNORMAL HIGH (ref 80.0–100.0)
Monocytes Absolute: 1.9 10*3/uL — ABNORMAL HIGH (ref 0.1–1.0)
Monocytes Relative: 10 %
Neutro Abs: 13.1 10*3/uL — ABNORMAL HIGH (ref 1.7–7.7)
Neutrophils Relative %: 64 %
Platelets: 371 10*3/uL (ref 150–400)
RBC: 2.82 MIL/uL — ABNORMAL LOW (ref 3.87–5.11)
RDW: 16.4 % — ABNORMAL HIGH (ref 11.5–15.5)
WBC: 20.3 10*3/uL — ABNORMAL HIGH (ref 4.0–10.5)
nRBC: 0 % (ref 0.0–0.2)

## 2021-04-08 LAB — BLOOD GAS, VENOUS
Acid-base deficit: 25.8 mmol/L — ABNORMAL HIGH (ref 0.0–2.0)
Bicarbonate: 3.7 mmol/L — ABNORMAL LOW (ref 20.0–28.0)
O2 Saturation: 63.7 %
Patient temperature: 98.6
pCO2, Ven: 14.2 mmHg — CL (ref 44.0–60.0)
pH, Ven: 7.044 — CL (ref 7.250–7.430)
pO2, Ven: 43.5 mmHg (ref 32.0–45.0)

## 2021-04-08 LAB — APTT: aPTT: 23 seconds — ABNORMAL LOW (ref 24–36)

## 2021-04-08 LAB — CBG MONITORING, ED
Glucose-Capillary: >600 mg/dL (ref 70–99)
Glucose-Capillary: >600 mg/dL (ref 70–99)
Glucose-Capillary: >600 mg/dL (ref 70–99)
Glucose-Capillary: >600 mg/dL (ref 70–99)
Glucose-Capillary: >600 mg/dL (ref 70–99)
Glucose-Capillary: >600 mg/dL (ref 70–99)

## 2021-04-08 LAB — PROTIME-INR
INR: 1.2 (ref 0.8–1.2)
Prothrombin Time: 15.3 seconds — ABNORMAL HIGH (ref 11.4–15.2)

## 2021-04-08 LAB — MAGNESIUM: Magnesium: 2.1 mg/dL (ref 1.7–2.4)

## 2021-04-08 LAB — I-STAT BETA HCG BLOOD, ED (MC, WL, AP ONLY): I-stat hCG, quantitative: <5 m[IU]/mL (ref <5)

## 2021-04-08 MED ORDER — HEPARIN BOLUS VIA INFUSION
1000.0000 [IU] | Freq: Once | INTRAVENOUS | Status: AC
  Administered 2021-04-08: 1000 [IU] via INTRAVENOUS
  Filled 2021-04-08: qty 1000

## 2021-04-08 MED ORDER — HYDROMORPHONE HCL 1 MG/ML IJ SOLN
1.0000 mg | Freq: Once | INTRAMUSCULAR | Status: AC
  Administered 2021-04-08: 1 mg via INTRAVENOUS
  Filled 2021-04-08: qty 1

## 2021-04-08 MED ORDER — DEXTROSE IN LACTATED RINGERS 5 % IV SOLN: INTRAVENOUS | Status: DC

## 2021-04-08 MED ORDER — CHLORHEXIDINE GLUCONATE CLOTH 2 % EX PADS
6.0000 | MEDICATED_PAD | Freq: Every day | CUTANEOUS | Status: DC
  Administered 2021-04-08 – 2021-04-09 (×2): 6 via TOPICAL

## 2021-04-08 MED ORDER — LACTATED RINGERS IV BOLUS: 2000.0000 mL | Freq: Once | INTRAVENOUS | Status: DC

## 2021-04-08 MED ORDER — HEPARIN (PORCINE) 25000 UT/250ML-% IV SOLN
750.0000 [IU]/h | INTRAVENOUS | Status: DC
Start: 2021-04-08 — End: 2021-04-09
  Administered 2021-04-08: 750 [IU]/h via INTRAVENOUS
  Filled 2021-04-08: qty 250

## 2021-04-08 MED ORDER — METRONIDAZOLE 500 MG/100ML IV SOLN
500.0000 mg | Freq: Three times a day (TID) | INTRAVENOUS | Status: DC
  Administered 2021-04-08 – 2021-04-09 (×4): 500 mg via INTRAVENOUS
  Filled 2021-04-08 (×4): qty 100

## 2021-04-08 MED ORDER — ASPIRIN 325 MG PO TABS
325.0000 mg | ORAL_TABLET | Freq: Once | ORAL | Status: DC
  Filled 2021-04-08: qty 1

## 2021-04-08 MED ORDER — INSULIN REGULAR(HUMAN) IN NACL 100-0.9 UT/100ML-% IV SOLN
INTRAVENOUS | Status: DC
  Administered 2021-04-08: 5.5 [IU]/h via INTRAVENOUS
  Administered 2021-04-08: 6.5 [IU]/h via INTRAVENOUS
  Filled 2021-04-08 (×2): qty 100

## 2021-04-08 MED ORDER — FAMOTIDINE IN NACL 20-0.9 MG/50ML-% IV SOLN
20.0000 mg | INTRAVENOUS | Status: DC
  Administered 2021-04-08: 20 mg via INTRAVENOUS
  Filled 2021-04-08 (×3): qty 50

## 2021-04-08 MED ORDER — ONDANSETRON HCL 4 MG/2ML IJ SOLN
4.0000 mg | Freq: Three times a day (TID) | INTRAMUSCULAR | Status: DC | PRN
  Administered 2021-04-09 (×2): 4 mg via INTRAVENOUS
  Filled 2021-04-08 (×2): qty 2

## 2021-04-08 MED ORDER — FENTANYL CITRATE PF 50 MCG/ML IJ SOSY
50.0000 ug | PREFILLED_SYRINGE | Freq: Once | INTRAMUSCULAR | Status: AC
  Administered 2021-04-08: 50 ug via INTRAVENOUS
  Filled 2021-04-08: qty 1

## 2021-04-08 MED ORDER — DEXTROSE 50 % IV SOLN: 0.0000 mL | INTRAVENOUS | Status: DC | PRN

## 2021-04-08 MED ORDER — SODIUM CHLORIDE 0.9 % IV SOLN: INTRAVENOUS | Status: DC

## 2021-04-08 MED ORDER — HEPARIN SODIUM (PORCINE) 5000 UNIT/ML IJ SOLN
5000.0000 [IU] | Freq: Three times a day (TID) | INTRAMUSCULAR | Status: DC
  Administered 2021-04-08: 5000 [IU] via SUBCUTANEOUS
  Filled 2021-04-08: qty 1

## 2021-04-08 MED ORDER — SUCRALFATE 1 GM/10ML PO SUSP
1.0000 g | Freq: Three times a day (TID) | ORAL | Status: DC
  Filled 2021-04-08 (×2): qty 10

## 2021-04-08 MED ORDER — LACTATED RINGERS IV BOLUS
2000.0000 mL | INTRAVENOUS | Status: DC
  Administered 2021-04-08: 2000 mL via INTRAVENOUS

## 2021-04-08 MED ORDER — LACTATED RINGERS IV SOLN: INTRAVENOUS | Status: DC

## 2021-04-08 MED ORDER — MORPHINE SULFATE (PF) 4 MG/ML IV SOLN
4.0000 mg | INTRAVENOUS | Status: DC | PRN
  Administered 2021-04-09 (×2): 4 mg via INTRAVENOUS
  Filled 2021-04-08 (×3): qty 1

## 2021-04-08 MED ORDER — MORPHINE SULFATE (PF) 2 MG/ML IV SOLN
2.0000 mg | INTRAVENOUS | Status: DC | PRN
  Administered 2021-04-08: 2 mg via INTRAVENOUS
  Filled 2021-04-08: qty 1

## 2021-04-08 NOTE — ED Notes (Signed)
PER ENDOTOOL: NO change to insulin drip at this time

## 2021-04-08 NOTE — ED Provider Notes (Signed)
[facility] DEPT Provider Note   CSN: [id number] Arrival date & time: 04/08/21  1448     History Chief Complaint  Patient presents with   Hyperglycemia    Ashley Freeman is a 30 y.o. female presenting to the ED with hyperglycemia and lethargy.  Patient arrives by EMS.  She only reports she is in pain all over.  She has been taking vancomycin (per her report) since hospital discharge 3 days ago, where she was diagnosed with C Diff (antigen positive, toxin negative).  She is a type 1 diabetic, has had recurring ED visits and hospitalizations for DKA.  She does not have her insulin pump applied; reports it "failed" at home. She reports continued BM but overall improvement of diarrhea.  She cannot provide much more history due to discomfort.  Per medical chart review, pt hospitalized from 12/2-12/7, discharged 3 days ago, after treatment for DKA and C Diff.  She had a positive blood cx with bacillus, which was felt to be likely contaminant.  hA1C level was 10.5.  She received 1 unit pRBC for hgb 6.6.  She also had an AKI, Cr improved to 1.3 on discharge.   Prior to that, hospitalized at [facility] from 11/21-11/27 with DKA in setting of Covid and influenza infection.   HPI     Past Medical History:  Diagnosis Date   Abscess, gluteal, right 08/24/2013   AKI (acute kidney injury) ([place]) 07/26/2014   Anemia 02/19/2012   Bartholin's gland abscess 09/19/2013   BV (bacterial vaginosis) 11/24/2015   Diabetes mellitus type I ([place]) 2001   Diagnosed at age 76 ; Type I   Diarrhea 05/30/2016   DKA (diabetic ketoacidoses) 08/19/2013   Also in 2018   Gonorrhea 08/2011   Treated in 09/2011   History of trichomoniasis 05/31/2016   Hyperlipidemia 03/28/2016   Sepsis ([place]) 09/19/2013    Patient Active Problem List   Diagnosis Date Noted   DKA (diabetic ketoacidosis) ([place]) 03/31/2021   COVID-19    Diabetic ketoacidosis without coma associated with  type 1 diabetes mellitus ([place])    Hyperbilirubinemia 03/07/2021   Mild protein malnutrition ([place]) 03/07/2021   DKA, type 1 ([place]) 12/08/2020   AKI (acute kidney injury) ([place]) 09/17/2020   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   Type 1 diabetes mellitus with stage 3a chronic kidney disease ([place]) 07/18/2020   History of pre-eclampsia 10/05/2019   Transaminitis 10/05/2019   Elevated serum creatinine 10/05/2019   Bilateral leg edema 18/56/3149   Systolic ejection murmur 70/26/3785   Shortness of breath 08/03/2019   Type 1 diabetes mellitus with hyperglycemia ([place]) 07/24/2019   Diabetic retinopathy ([place]) 07/02/2019   Proteinuria due to type 1 diabetes mellitus ([place]) 05/21/2019   Diabetic neuropathy ([place]) 01/16/2016   Leukocytosis 02/19/2012   Normocytic anemia 02/19/2012   Diabetes mellitus type 1, uncontrolled, with complications 88/50/2774    Past Surgical History:  Procedure Laterality Date   CESAREAN SECTION N/A 10/05/2019   Procedure: CESAREAN SECTION;  Surgeon: Aletha Halim, MD;  Location: [facility];  Service: Obstetrics;  Laterality: N/A;   INCISION AND DRAINAGE ABSCESS Left 09/28/2019   Procedure: INCISION AND DRAINAGE VULVAR ABCESS;  Surgeon: [name], MD;  Location: [place];  Service: Gynecology;  Laterality: Left;   INCISION AND DRAINAGE PERIRECTAL ABSCESS Right 08/18/2013   Procedure: IRRIGATION AND DEBRIDEMENT GLUTEAL ABSCESS;  Surgeon: Ralene Ok, MD;  Location: [place];  Service: General;  Laterality: Right;  INCISION AND DRAINAGE PERIRECTAL ABSCESS Right 09/19/2013   Procedure: IRRIGATION AND DEBRIDEMENT RIGHT GLUTEAL AND LABIAL ABSCESSES;  Surgeon: Ralene Ok, MD;  Location: [place];  Service: General;  Laterality: Right;   INCISION AND DRAINAGE PERIRECTAL ABSCESS Right 09/24/2013   Procedure: IRRIGATION AND DEBRIDEMENT PERIRECTAL ABSCESS;  Surgeon: Gwenyth Ober, MD;  Location: [place];  Service: General;  Laterality: Right;     OB  History     Gravida  2   Para  1   Term      Preterm  1   AB  1   Living  1      SAB  1   IAB      Ectopic      Multiple  0   Live Births  1           Family History  Problem Relation Age of Onset   Asthma Mother    Carpal tunnel syndrome Mother    Gout Father    Diabetes Paternal Grandmother    Anesthesia problems Neg Hx     Social History   Tobacco Use   Smoking status: Never   Smokeless tobacco: Never  Vaping Use   Vaping Use: Never used  Substance Use Topics   Alcohol use: Not Currently   Drug use: No    Home Medications Prior to Admission medications   Medication Sig Start Date End Date Taking? Authorizing Provider  albuterol (VENTOLIN HFA) 108 (90 Base) MCG/ACT inhaler Inhale 2 puffs into the lungs every 4 (four) hours as needed for wheezing or shortness of breath. 08/09/20   [provider]  amLODipine (NORVASC) 10 MG tablet Take 1 tablet (10 mg total) by mouth daily. 03/24/21 04/23/21  Richarda Osmond, MD  blood glucose meter kit and supplies KIT Dispense based on patient and insurance preference. Use up to four times daily as directed. (FOR ICD-9 250.00, 250.01). 05/25/17   Isla Pence, MD  Blood Pressure Monitoring (BLOOD PRESSURE KIT) DEVI 1 Device by Does not apply route as needed. 05/06/19   Anyanwu, Sallyanne Havers, MD  Continuous Blood Gluc Sensor (DEXCOM G6 SENSOR) MISC Inject 1 Device into the skin as directed. 12/16/20   Shamleffer, Melanie Crazier, MD  Continuous Blood Gluc Transmit (DEXCOM G6 TRANSMITTER) MISC Inject 1 Device into the skin as directed. Use to check blood sugar daily 12/16/20   Shamleffer, Melanie Crazier, MD  famotidine (PEPCID) 20 MG tablet Take 1 tablet (20 mg total) by mouth 2 (two) times daily. 04/05/21 05/05/21  Alma Friendly, MD  insulin aspart (NOVOLOG) 100 UNIT/ML FlexPen 0-9 Units, Subcutaneous, 3 times daily with meals, CBG < 70: Implement Hypoglycemia measures CBG 70 - 120: 0 units CBG 121 - 150: 1  unit CBG 151 - 200: 2 units CBG 201 - 250: 3 units CBG 251 - 300: 5 units CBG 301 - 350: 7 units CBG 351 - 400: 9 units CBG > 400: call MD 03/29/21   Shamleffer, Melanie Crazier, MD  Insulin Disposable Pump (OMNIPOD 5 G6 INTRO, GEN 5,) KIT 1 Device by Does not apply route every 3 (three) days. Patient taking differently: 1 Device by Does not apply route every 3 (three) days. 100 units for 72 hours 12/16/20   Shamleffer, Melanie Crazier, MD  Insulin Pen Needle 32G X 4 MM MISC Use as directed 10/07/20   Ghimire, Henreitta Leber, MD  ipratropium (ATROVENT HFA) 17 MCG/ACT inhaler Inhale 1 puff into the lungs every 6 (six) hours. 03/24/21  Richarda Osmond, MD  lamoTRIgine (LAMICTAL) 150 MG tablet Take 150 mg by mouth daily. 03/29/21   [provider]  nystatin (MYCOSTATIN) 100000 UNIT/ML suspension Take 5 mLs (500,000 Units total) by mouth 4 (four) times daily. 04/05/21   Alma Friendly, MD  sucralfate (CARAFATE) 1 g tablet Take 1 tablet (1 g total) by mouth 4 (four) times daily -  with meals and at bedtime. 04/05/21 05/05/21  Alma Friendly, MD  vancomycin (VANCOCIN) 125 MG capsule Take 1 capsule (125 mg total) by mouth 4 (four) times daily for 5 days. 04/06/21 04/11/21  Alma Friendly, MD  Ferrous Sulfate (IRON) 325 (65 Fe) MG TABS Take 1 tablet (325 mg total) by mouth every other day. Patient not taking: Reported on 07/25/2020 06/17/19 07/25/20  Chauncey Mann, MD  hydrochlorothiazide (HYDRODIURIL) 25 MG tablet Take 1 tablet (25 mg total) by mouth daily. Patient not taking: No sig reported 10/08/19 07/25/20  Constant, Peggy, MD  promethazine (PHENERGAN) 25 MG tablet Take 1 tablet (25 mg total) by mouth every 8 (eight) hours as needed for nausea or vomiting (if zofran fails). Patient not taking: Reported on 10/06/2020 08/25/20 10/06/20  Varney Biles, MD    Allergies    Cephalexin, Penicillins, Benadryl [diphenhydramine], and Doxycycline  Review of Systems   Review of Systems   Constitutional:  Positive for fatigue. Negative for chills.  Respiratory:  Negative for cough and shortness of breath.   Cardiovascular:  Negative for chest pain and palpitations.  Gastrointestinal:  Positive for abdominal pain, diarrhea, nausea and vomiting.  Genitourinary:  Negative for dysuria and hematuria.  Musculoskeletal:  Positive for arthralgias.  Skin:  Negative for color change and rash.  Neurological:  Positive for headaches. Negative for syncope.  All other systems reviewed and are negative.  Physical Exam Updated Vital Signs BP 113/67   Pulse (!) 102   Temp 97.6 F (36.4 C) (Axillary)   Resp (!) 21   Ht _0  (1.6 m)   Wt 62 kg   SpO2 100%   BMI 24.21 kg/m   Physical Exam Constitutional:      General: She is not in acute distress. HENT:     Head: Normocephalic and atraumatic.  Eyes:     Conjunctiva/sclera: Conjunctivae normal.     Pupils: Pupils are equal, round, and reactive to light.  Cardiovascular:     Rate and Rhythm: Regular rhythm. Tachycardia present.     Pulses: Normal pulses.  Pulmonary:     Effort: Pulmonary effort is normal. No respiratory distress.  Abdominal:     General: There is no distension.     Tenderness: There is no abdominal tenderness.  Skin:    General: Skin is warm and dry.  Neurological:     General: No focal deficit present.     Mental Status: She is alert. Mental status is at baseline.    ED Results / Procedures / Treatments   Labs (all labs ordered are listed, but only abnormal results are displayed) Labs Reviewed  BASIC METABOLIC PANEL - Abnormal; Notable for the following components:      Result Value   Sodium 126 (*)    Potassium 5.9 (*)    CO2 <7 (*)    Glucose, Bld 1,191 (*)    BUN 27 (*)    Creatinine, Ser 2.78 (*)    Calcium 8.2 (*)    GFR, Estimated 23 (*)    All other components within normal limits  BETA-HYDROXYBUTYRIC ACID -  Abnormal; Notable for the following components:   Beta-Hydroxybutyric Acid  >8.00 (*)    All other components within normal limits  CBC WITH DIFFERENTIAL/PLATELET - Abnormal; Notable for the following components:   WBC 20.3 (*)    RBC 2.82 (*)    Hemoglobin 7.6 (*)    HCT 28.8 (*)    MCV 102.1 (*)    MCHC 26.4 (*)    RDW 16.4 (*)    Neutro Abs 13.1 (*)    Monocytes Absolute 1.9 (*)    Abs Immature Granulocytes 2.49 (*)    All other components within normal limits  BLOOD GAS, VENOUS - Abnormal; Notable for the following components:   pH, Ven 7.044 (*)    pCO2, Ven 14.2 (*)    Bicarbonate 3.7 (*)    Acid-base deficit 25.8 (*)    All other components within normal limits  CBG MONITORING, ED - Abnormal; Notable for the following components:   Glucose-Capillary >600 (*)    All other components within normal limits  CBG MONITORING, ED - Abnormal; Notable for the following components:   Glucose-Capillary >600 (*)    All other components within normal limits  CBG MONITORING, ED - Abnormal; Notable for the following components:   Glucose-Capillary >600 (*)    All other components within normal limits  CBG MONITORING, ED - Abnormal; Notable for the following components:   Glucose-Capillary >600 (*)    All other components within normal limits  RESP PANEL BY RT-PCR (FLU A&B, COVID) ARPGX2  MAGNESIUM  BASIC METABOLIC PANEL  BASIC METABOLIC PANEL  BASIC METABOLIC PANEL  BASIC METABOLIC PANEL  BETA-HYDROXYBUTYRIC ACID  BETA-HYDROXYBUTYRIC ACID  URINALYSIS, ROUTINE W REFLEX MICROSCOPIC  I-STAT BETA HCG BLOOD, ED ([facility], [facility], [facility] ONLY)    EKG EKG Interpretation  Date/Time:  Saturday April 08 2021 07:10:27 EST Ventricular Rate:  113 PR Interval:  119 QRS Duration: 107 QT Interval:  358 QTC Calculation: 491 R Axis:   49 Text Interpretation: Sinus tachycardia Low voltage, precordial leads Borderline prolonged QT interval Confirmed by Octaviano Glow [phone]) on 04/08/2021 8:11:36 AM  Radiology [place] EKG SITE RITE  Result Date: 04/08/2021 If Site Rite image  not attached, placement could not be confirmed due to current cardiac rhythm.   Procedures .Critical Care Performed by: Wyvonnia Dusky, MD Authorized by: Wyvonnia Dusky, MD   Critical care provider statement:    Critical care time (minutes):  45   Critical care time was exclusive of:  Separately billable procedures and treating other patients   Critical care was necessary to treat or prevent imminent or life-threatening deterioration of the following conditions:  Endocrine crisis and metabolic crisis   Critical care was time spent personally by me on the following activities:  Ordering and performing treatments and interventions, ordering and review of laboratory studies, ordering and review of radiographic studies, pulse oximetry, review of old charts, examination of patient and evaluation of patient's response to treatment Comments:     DKA Ultrasound ED Peripheral IV (Provider)  Date/Time: 04/08/2021 11:26 AM Performed by: Wyvonnia Dusky, MD Authorized by: Wyvonnia Dusky, MD   Procedure details:    Indications: hydration, hypotension, multiple failed IV attempts and poor IV access     Skin Prep: chlorhexidine gluconate     Location:  Right AC   Angiocath:  20 G   Bedside Ultrasound Guided: Yes     Images: not archived     Patient tolerated procedure without complications: Yes  Dressing applied: Yes     Medications Ordered in ED Medications  lactated ringers bolus 2,000 mL (2,000 mLs Intravenous Not Given 04/08/21 0809)  insulin regular, human (MYXREDLIN) 100 units/ 100 mL infusion (5.5 Units/hr Intravenous New Bag/Given 04/08/21 0912)  dextrose 5 % in lactated ringers infusion (0 mLs Intravenous Hold 04/08/21 0919)  dextrose 50 % solution 0-50 mL (has no administration in time range)  0.9 %  sodium chloride infusion ( Intravenous New Bag/Given 04/08/21 0930)  fentaNYL (SUBLIMAZE) injection 50 mcg (50 mcg Intravenous Given 04/08/21 0738)  HYDROmorphone  (DILAUDID) injection 1 mg (1 mg Intravenous Given 04/08/21 0930)    ED Course  I have reviewed the triage vital signs and the nursing notes.  Pertinent labs & imaging results that were available during my care of the patient were reviewed by me and considered in my medical decision making (see chart for details).  Pt here with recurring DKA, long-standing history of insulin noncompliance and issues with her pump, although it appears she had extensive education in the hospital last week at the time of discharge.  She does have ongoing diarrhea, continues on PO vancomycin for possible C. Diff, and this may be a contributing factor for DKA/dehydration.  Per her discharge summary she was scheduled to take another 5 days total of Vancomycin PO, from 12/8-12/12.  Today would be day 3.    I reviewed her prior medical records I reviewed and interpreted her EKG, Labs and Imaging today, showing evidence of DKA.  She is mentating well on arrival, stable VS.  I ordered 2L LR fluid bolus, as well as endotool insulin  Clinical Course as of 04/08/21 1127  Sat Apr 08, 2021  0758 ECG per my interpretation shows sinus tachycardia, HR 113, Qtc 491, no STEMI [MT]  0849 Glucose(!!): 1,191 [MT]  0849 Creatinine(!): 2.78 [MT]  0849 Suspect pseudohyponatremia.  Glucose 1191.  BUN and Cr elevated, consistent with dehydration.  Endotool ordered for insulin.  K 5.9 - hold on supplemental K, expect improvement after fluids and insulin. [MT]  0849 Overall WBC improved from prior presentation - may have ongoing C Diff issue, or else reactive leukocytosis/hemoconcentration.  Lower suspicion for sepsis in this setting. [MT]  [phone] Critical care team paged [MT]  0921 Pt completed 2L bolus, now on NS infusion with endotool - mentating well.  No kausmaul respirations. [MT]  [phone] Crit care team contacted -will reach out to Dr Melvyn Novas who is on staff at [facility] for patient evaluation [MT]  [phone] Pt seen by Dr Melvyn Novas from pulm  critical care - felt to be reasonably stable and clinically well appearing for step down unit, not requiring ICU at this time.  No evidence of shock or impending respiratory failure.  [MT]  1038 Admitted to hospitalist [MT]    Clinical Course User Index [MT] Brenton Joines, Carola Rhine, MD     Final Clinical Impression(s) / ED Diagnoses Final diagnoses:  Diabetic ketoacidosis without coma associated with type 1 diabetes mellitus ([place])  Diarrhea, unspecified type    Rx / DC Orders ED Discharge Orders     None        Wyvonnia Dusky, MD 04/08/21 1127

## 2021-04-08 NOTE — Progress Notes (Addendum)
Notified by staff of patient c/o chest pain. Says it's all over her chest but worse mid-sternal. Sharp pain. EKG appears ok. She does have an elevation of her trp to 496. Her CP is reproducible. I spoke with cardiology. Recommended transfer to [facility] for eval and possible cath. Recommended ICU placement. Spoke with ICU team here. They will evaluate her.   UPDATE: PCCM onboard. Holding at [facility] for now. Cards in agreement. Cards recs ASA and heparin gtt. Will add.

## 2021-04-08 NOTE — ED Notes (Signed)
CRITICAL VALUE STICKER  CRITICAL VALUE:Bg 1,123  RECEIVER (on-site recipient of call):Dylan  DATE & TIME NOTIFIED: 1204 04/08/21  MESSENGER (representative from lab):lab  MD NOTIFIED: Dr. Marylyn Ishihara  TIME OF NOTIFICATION:1211  RESPONSE:  Continue with Endotool

## 2021-04-08 NOTE — ED Notes (Signed)
IV team unsuccessful

## 2021-04-08 NOTE — Progress Notes (Signed)
Attempted to call bedside nurse for information regarding patient and PICC order.  Per chart patient currently has 2 PIVs.  Per Judson Roch, RN nurse is giving report and unable to come to the phone.  Will attempt to call back later when PICC nurse is available again.

## 2021-04-08 NOTE — H&P (Signed)
History and Physical    Monta Zisman [place] VWU:[id number] DOB: 09/15/1990 DOA: 04/08/2021  PCP: Verlee Rossetti, PA-C  Patient coming from: Home  Chief Complaint: "my sugar"  HPI: Ashley Freeman is a 30 y.o. female with medical history significant of DM, CKD3a, GERD, HTN. Presenting with lethargy, aches, and hyperglycemia. Recent admission to the hospital for sepsis secondary to c diff infection. She was discharged to home on PO vanc 3 days ago w/ instruction to continue that medication through 04/10/21. She reports that she has been compliant. She reports that she ran out of her insulin a couple of days ago. She's had whole body aches, N/V, and diarrhea over the last couple of days. She did not try any medications for her symptoms at home. Her symptoms did not resolve this morning, so she decided to come to the ED. She denies any other aggravating or alleviating factors.   ED Course: She was noted to have a glucose of 1191, a bicarb less than 7, and gap that was unable to be calculated. Her pH was 7.044. PCCM was consulted. They reviewed her case and felt she was ok to be placed on the medicine service in the SDU. TRH was called for admission.   Review of Systems:  Review of systems is otherwise negative for all not mentioned in HPI.   PMHx Past Medical History:  Diagnosis Date   Abscess, gluteal, right 08/24/2013   AKI (acute kidney injury) (HCC) 07/26/2014   Anemia 02/19/2012   Bartholin's gland abscess 09/19/2013   BV (bacterial vaginosis) 11/24/2015   Diabetes mellitus type I (HCC) 2001   Diagnosed at age 63 ; Type I   Diarrhea 05/30/2016   DKA (diabetic ketoacidoses) 08/19/2013   Also in 2018   Gonorrhea 08/2011   Treated in 09/2011   History of trichomoniasis 05/31/2016   Hyperlipidemia 03/28/2016   Sepsis (HCC) 09/19/2013    PSHx Past Surgical History:  Procedure Laterality Date   CESAREAN SECTION N/A 10/05/2019   Procedure: CESAREAN SECTION;  Surgeon:  L[place] T[place] Bing, MD;  Location: [facility] ORS;  Service: Obstetrics;  Laterality: N/A;   INCISION AND DRAINAGE ABSCESS Left 09/28/2019   Procedure: INCISION AND DRAINAGE VULVAR ABCESS;  Surgeon: Tilda Burrow, MD;  Location: [facility] OR;  Service: Gynecology;  Laterality: Left;   INCISION AND DRAINAGE PERIRECTAL ABSCESS Right 08/18/2013   Procedure: IRRIGATION AND DEBRIDEMENT GLUTEAL ABSCESS;  Surgeon: Axel Filler, MD;  Location: [facility] OR;  Service: General;  Laterality: Right;   INCISION AND DRAINAGE PERIRECTAL ABSCESS Right 09/19/2013   Procedure: IRRIGATION AND DEBRIDEMENT RIGHT GLUTEAL AND LABIAL ABSCESSES;  Surgeon: Axel Filler, MD;  Location: [facility] OR;  Service: General;  Laterality: Right;   INCISION AND DRAINAGE PERIRECTAL ABSCESS Right 09/24/2013   Procedure: IRRIGATION AND DEBRIDEMENT PERIRECTAL ABSCESS;  Surgeon: Cherylynn Ridges, MD;  Location: [facility] OR;  Service: General;  Laterality: Right;    SocHx  reports that she has never smoked. She has never used smokeless tobacco. She reports that she does not currently use alcohol. She reports that she does not use drugs.  Allergies  Allergen Reactions   Cephalexin Anaphylaxis   Penicillins Hives and Rash    Has patient had a PCN reaction causing immediate rash, facial/tongue/throat swelling, SOB or lightheadedness with hypotension: Yes Has patient had a PCN reaction causing severe rash involving mucus membranes or skin necrosis: No Has patient had a PCN reaction that required hospitalization: Yes Has patient had a PCN reaction occurring within the  last 10 years: No Spoke with pt - childhood hives told by mom, tried no pcns since, doesn't remember reaction herself    Benadryl [Diphenhydramine] Itching   Doxycycline Itching    FamHx Family History  Problem Relation Age of Onset   Asthma Mother    Carpal tunnel syndrome Mother    Gout Father    Diabetes Paternal Grandmother    Anesthesia problems Neg Hx     Prior to Admission medications    Medication Sig Start Date End Date Taking? Authorizing Provider  albuterol (VENTOLIN HFA) 108 (90 Base) MCG/ACT inhaler Inhale 2 puffs into the lungs every 4 (four) hours as needed for wheezing or shortness of breath. 08/09/20   [provider]  amLODipine (NORVASC) 10 MG tablet Take 1 tablet (10 mg total) by mouth daily. 03/24/21 04/23/21  Leeroy Bock, MD  blood glucose meter kit and supplies KIT Dispense based on patient and insurance preference. Use up to four times daily as directed. (FOR ICD-9 250.00, 250.01). 05/25/17   Jacalyn Lefevre, MD  Blood Pressure Monitoring (BLOOD PRESSURE KIT) DEVI 1 Device by Does not apply route as needed. 05/06/19   Anyanwu, Jethro Bastos, MD  Continuous Blood Gluc Sensor (DEXCOM G6 SENSOR) MISC Inject 1 Device into the skin as directed. 12/16/20   Shamleffer, Konrad Dolores, MD  Continuous Blood Gluc Transmit (DEXCOM G6 TRANSMITTER) MISC Inject 1 Device into the skin as directed. Use to check blood sugar daily 12/16/20   Shamleffer, Konrad Dolores, MD  famotidine (PEPCID) 20 MG tablet Take 1 tablet (20 mg total) by mouth 2 (two) times daily. 04/05/21 05/05/21  Briant Cedar, MD  insulin aspart (NOVOLOG) 100 UNIT/ML FlexPen 0-9 Units, Subcutaneous, 3 times daily with meals, CBG < 70: Implement Hypoglycemia measures CBG 70 - 120: 0 units CBG 121 - 150: 1 unit CBG 151 - 200: 2 units CBG 201 - 250: 3 units CBG 251 - 300: 5 units CBG 301 - 350: 7 units CBG 351 - 400: 9 units CBG > 400: call MD 03/29/21   Shamleffer, Konrad Dolores, MD  Insulin Disposable Pump (OMNIPOD 5 G6 INTRO, GEN 5,) KIT 1 Device by Does not apply route every 3 (three) days. Patient taking differently: 1 Device by Does not apply route every 3 (three) days. 100 units for 72 hours 12/16/20   Shamleffer, Konrad Dolores, MD  Insulin Pen Needle 32G X 4 MM MISC Use as directed 10/07/20   Ghimire, Werner Lean, MD  ipratropium (ATROVENT HFA) 17 MCG/ACT inhaler Inhale 1 puff into the lungs every 6  (six) hours. 03/24/21   Leeroy Bock, MD  lamoTRIgine (LAMICTAL) 150 MG tablet Take 150 mg by mouth daily. 03/29/21   [provider]  nystatin (MYCOSTATIN) 100000 UNIT/ML suspension Take 5 mLs (500,000 Units total) by mouth 4 (four) times daily. 04/05/21   Briant Cedar, MD  sucralfate (CARAFATE) 1 g tablet Take 1 tablet (1 g total) by mouth 4 (four) times daily -  with meals and at bedtime. 04/05/21 05/05/21  Briant Cedar, MD  vancomycin (VANCOCIN) 125 MG capsule Take 1 capsule (125 mg total) by mouth 4 (four) times daily for 5 days. 04/06/21 04/11/21  Briant Cedar, MD  Ferrous Sulfate (IRON) 325 (65 Fe) MG TABS Take 1 tablet (325 mg total) by mouth every other day. Patient not taking: Reported on 07/25/2020 06/17/19 07/25/20  Joselyn Arrow, MD  hydrochlorothiazide (HYDRODIURIL) 25 MG tablet Take 1 tablet (25 mg total) by mouth  daily. Patient not taking: No sig reported 10/08/19 07/25/20  Constant, Peggy, MD  promethazine (PHENERGAN) 25 MG tablet Take 1 tablet (25 mg total) by mouth every 8 (eight) hours as needed for nausea or vomiting (if zofran fails). Patient not taking: Reported on 10/06/2020 08/25/20 10/06/20  Derwood Kaplan, MD    Physical Exam: Vitals:   04/08/21 0815 04/08/21 0907 04/08/21 0930 04/08/21 1000  BP: 101/62 (!) 104/55 (!) 103/55 107/60  Pulse: (!) 106 (!) 107 (!) 103 (!) 102  Resp: 17 (!) 31 (!) 31 20  Temp:      TempSrc:      SpO2: 100% 100% 100% 100%  Weight:      Height:        General: 30 y.o. female resting in bed in NAD Eyes: PERRL, normal sclera ENMT: Nares patent w/o discharge, orophaynx clear, dentition normal, ears w/o discharge/lesions/ulcers Neck: Supple, trachea midline Cardiovascular: tachy +S1, S2, no m/g/r, equal pulses throughout Respiratory: CTABL, no w/r/r, normal WOB GI: BS+, NDNT, no masses noted, no organomegaly noted MSK: No e/c/c Neuro: A&O x 3, drowsy/lethargic, but no focal deficits Psyc: Lethargic, but  answering questions appropriately calm/cooperative  Labs on Admission: I have personally reviewed following labs and imaging studies  CBC: Recent Labs  Lab 04/03/21 0017 04/03/21 1119 04/04/21 0925 04/05/21 0353 04/08/21 0728  WBC 23.2* 20.3* 15.5* 13.3* 20.3*  NEUTROABS  --   --   --   --  13.1*  HGB 8.8* 9.3* 9.0* 7.7* 7.6*  HCT 25.9* 28.0* 26.9* 23.8* 28.8*  MCV 80.7 81.6 81.8 83.8 102.1*  PLT 285 310 275 272 371   Basic Metabolic Panel: Recent Labs  Lab 04/01/21 2016 04/02/21 0250 04/03/21 0017 04/03/21 1119 04/04/21 0925 04/05/21 0353 04/08/21 0725 04/08/21 0728  NA 133* 133* 134* 134* 135 135  --  126*  K 3.6 3.6 3.5 3.4* 3.8 3.6  --  5.9*  CL 110 110 110 109 110 111  --  98  CO2 16* 17* 18* 20* 19* 21*  --  <7*  GLUCOSE 184* 130* 117* 176* 135* 60*  --  1,191*  BUN 28* 23* 21* 20 19 16   --  27*  CREATININE 2.08* 1.94* 1.71* 1.58* 1.38* 1.36*  --  2.78*  CALCIUM 7.7* 7.3* 7.5* 7.7* 7.5* 7.5*  --  8.2*  MG 2.3 2.1  --  1.8 1.6* 1.7 2.1  --   PHOS 1.8* 1.8*  --  1.9* 3.5 3.1  --   --    GFR: Estimated Creatinine Clearance: 24.9 mL/min (A) (by C-G formula based on SCr of 2.78 mg/dL (H)). Liver Function Tests: Recent Labs  Lab 04/02/21 0250 04/03/21 0017 04/03/21 1119 04/04/21 0925 04/05/21 0353  AST 20 28 23 15 16   ALT 16 17 17 14 12   ALKPHOS 108 111 119 103 94  BILITOT 0.5 0.5 0.4 0.5 0.5  PROT 4.1* 4.7* 4.9* 4.8* 4.3*  ALBUMIN <1.5* 1.6* 1.7* 1.6* 1.6*   No results for input(s): LIPASE, AMYLASE in the last 168 hours. No results for input(s): AMMONIA in the last 168 hours. Coagulation Profile: No results for input(s): INR, PROTIME in the last 168 hours. Cardiac Enzymes: No results for input(s): CKTOTAL, CKMB, CKMBINDEX, TROPONINI in the last 168 hours. BNP (last 3 results) No results for input(s): PROBNP in the last 8760 hours. HbA1C: No results for input(s): HGBA1C in the last 72 hours. CBG: Recent Labs  Lab 04/05/21 0735 04/05/21 1142  04/08/21 1610 04/08/21 0905 04/08/21 9604  GLUCAP 54* 91 >600* >600* >600*   Lipid Profile: No results for input(s): CHOL, HDL, LDLCALC, TRIG, CHOLHDL, LDLDIRECT in the last 72 hours. Thyroid Function Tests: No results for input(s): TSH, T4TOTAL, FREET4, T3FREE, THYROIDAB in the last 72 hours. Anemia Panel: No results for input(s): VITAMINB12, FOLATE, FERRITIN, TIBC, IRON, RETICCTPCT in the last 72 hours. Urine analysis:    Component Value Date/Time   COLORURINE STRAW (A) 03/31/2021 1307   APPEARANCEUR HAZY (A) 03/31/2021 1307   APPEARANCEUR Hazy 11/10/2013 2043   LABSPEC 1.018 03/31/2021 1307   LABSPEC 1.031 11/10/2013 2043   PHURINE 5.0 03/31/2021 1307   GLUCOSEU >=500 (A) 03/31/2021 1307   GLUCOSEU >=500 11/10/2013 2043   HGBUR MODERATE (A) 03/31/2021 1307   BILIRUBINUR NEGATIVE 03/31/2021 1307   BILIRUBINUR negative 06/04/2018 1035   BILIRUBINUR Negative 11/24/2015 1443   BILIRUBINUR Negative 11/10/2013 2043   KETONESUR 20 (A) 03/31/2021 1307   PROTEINUR 100 (A) 03/31/2021 1307   UROBILINOGEN 0.2 09/14/2019 1732   NITRITE NEGATIVE 03/31/2021 1307   LEUKOCYTESUR NEGATIVE 03/31/2021 1307   LEUKOCYTESUR 1+ 11/10/2013 2043    Radiological Exams on Admission: [place] EKG SITE RITE  Result Date: 04/08/2021 If Site Rite image not attached, placement could not be confirmed due to current cardiac rhythm.   EKG: Independently reviewed. Sinus tach, no st elevations  Assessment/Plan DKA [id number]     - admit to inpt, SDU     - continue insulin gtt, fluids per Endotool     - q4h BNP, follow betahydroxybutyric acid     - NPO; may have non-caloric fluids     - DM coordinator consult  Diarrhea/recent C diff infection     - reports still having diarrhea     - was supposed to continue vanc through 04/10/21     - will add flagyl while NPO  AKI on CKD3a     - d/t combination of above     - continue fluids, follow I&O, check renal US  Macrocytic anemia     - stable, no evidence of  bleed  HTN     - BP low at admission     - hold home anti-hypertensives     - continue fluids per Endotool  DVT prophylaxis: heparin  Code Status: FULL  Family Communication: None at bedside  Consults called: EDP spoke with PCCM (Dr. Sherene Sires)   Status is: Inpatient  Remains inpatient appropriate because: severity of illness  Bethany Cumming A Honestie Kulik DO [facility]  If 7PM-7AM, please contact night-coverage [email or web address]  04/08/2021, 10:43 AM

## 2021-04-08 NOTE — ED Notes (Signed)
Dr. Langston Masker informed that IV team was unsuccessful. LR infusion changed to NS due to incompatibility with Insulin drip.

## 2021-04-08 NOTE — Consult Note (Signed)
NAME:  Ashley Freeman, MRN:  [id number], DOB:  1990-06-10, LOS: 0 ADMISSION DATE:  04/08/2021, CONSULTATION DATE:  12/10 REFERRING MD:  Triad, CHIEF COMPLAINT:  dka/ atypical cp   History of Present Illness:  26 yobf with repeated admits for DKA and at least since her last admit has been in "constant chest pain"  only relieved by IV  MS and back in er   again am 12/10 with severe DKA with CP and Troponin up from mid 200's on last admit to near 500 this admit and  [facility] for possible [facility] intervention so consulted by Tri[facility] pm 12/10.  CP is constant diffuse "sharp burning" across ant chest assoc with exquisite tenderness to palpation but no sob/ n  radiation,  no change with breathing meals or ex and she cannot articulate a time when she was not in pain (first said p d/c but was having cp requiring that admit and was described as better on day of d/c but this was p rx with MS and had multiple nl echo's for cp as recently as 12/08/20   Also has turned pos for C diff since last admit      Significant Hospital Events: Including procedures, antibiotic start and stop dates in addition to other pertinent events   Admit icu with severe dka 12/10 am  Echo 12/10 >>>   Scheduled Meds:  Chlorhexidine Gluconate Cloth  6 each Topical Daily   heparin  5,000 Units Subcutaneous Q8H   Continuous Infusions:  sodium chloride 125 mL/hr at 04/08/21 1629   dextrose 5% lactated ringers Stopped (04/08/21 0919)   insulin 5.5 Units/hr (04/08/21 1629)   lactated ringers     metronidazole Stopped (04/08/21 1604)   PRN Meds:.dextrose, morphine injection, ondansetron (ZOFRAN) IV   Interim History / Subjective:  Only improves with Morphine / still having diarrhea but denies nausea   Objective   Blood pressure (!) 101/43, pulse (!) 101, temperature (!) 93.5 F (34.2 C), temperature source Rectal, resp. rate 12, height '5\' 3"'  (1.6 m), weight 62 kg, SpO2 98 %, unknown if  currently breastfeeding.        Intake/Output Summary (Last 24 hours) at 04/08/2021 1835 Last data filed at 04/08/2021 1752 Gross per 24 hour  Intake 2848.08 ml  Output 555 ml  Net 2293.08 ml   Filed Weights   04/08/21 0655  Weight: 62 kg    Examination: General: uncomfortable bf lying  on R side  Tmax  97.6  02 sats 98%  RA HENT: orophx clear  Lungs: clear bs bilaerally  Cardiovascular: RRR no rub/ m or gallop Abdomen: soft/ benig Extremities: warm s edema Neuro: not particularly cooperative with specifics on hx but oriented x 4   Ekg sinus tach with minimal non-specific st t wave changes      Assessment & Plan:  1)  Atypical chronic chest pain assoc with cw tenderness and pos troponins in setting of severe dka s evidence of chf/ stemi or pericarditis to date but can't rule out microcirculatory ischemia in this poorly controlled IDDM setting  >>> discussed with Dr Gaynelle Arabian with ? Whether to [facility] and do emergent cath and I agreed to observe her here in the absence of a more dramatic risk in Troponins or def st changes and go ahead and just use MS IV/ hep/ asa in case she's having microciculatory stress related ischemia  and re-eval in am  2) C  diff colitis / ? GERD  > rx pepcid/carafate but avoid PPI in this setting   3) severe dka > rx per triad and sort out how to keep this from recurring with such frequency.   4) acute on chronic renal insuff  Lab Results  Component Value Date   CREATININE 2.78 (H) 04/08/2021   CREATININE 2.68 (H) 04/08/2021   CREATININE 2.78 (H) 04/08/2021   >>> avoid nephrotoxins esp contrast dye in this setting unless absolutely  necessary.     Labs   CBC: Recent Labs  Lab 04/03/21 0017 04/03/21 1119 04/04/21 0925 04/05/21 0353 04/08/21 0728  WBC 23.2* 20.3* 15.5* 13.3* 20.3*  NEUTROABS  --   --   --   --  13.1*  HGB 8.8* 9.3* 9.0* 7.7* 7.6*  HCT 25.9* 28.0* 26.9* 23.8* 28.8*  MCV 80.7 81.6 81.8 83.8  102.1*  PLT 285 310 275 272 332    Basic Metabolic Panel: Recent Labs  Lab 04/01/21 2016 04/02/21 0250 04/03/21 0017 04/03/21 1119 04/04/21 0925 04/05/21 0353 04/08/21 0725 04/08/21 0728 04/08/21 1130 04/08/21 1447  NA 133* 133*   < > 134* 135 135  --  126* 128* 131*  K 3.6 3.6   < > 3.4* 3.8 3.6  --  5.9* 4.7 4.0  CL 110 110   < > 109 110 111  --  98 101 105  CO2 16* 17*   < > 20* 19* 21*  --  <7* <7* 8*  GLUCOSE 184* 130*   < > 176* 135* 60*  --  1,191* 1,123* 963*  BUN 28* 23*   < > '20 19 16  ' --  27* 27* 28*  CREATININE 2.08* 1.94*   < > 1.58* 1.38* 1.36*  --  2.78* 2.68* 2.78*  CALCIUM 7.7* 7.3*   < > 7.7* 7.5* 7.5*  --  8.2* 7.9* 8.2*  MG 2.3 2.1  --  1.8 1.6* 1.7 2.1  --   --   --   PHOS 1.8* 1.8*  --  1.9* 3.5 3.1  --   --   --   --    < > = values in this interval not displayed.   GFR: Estimated Creatinine Clearance: 24.9 mL/min (A) (by C-G formula based on SCr of 2.78 mg/dL (H)). Recent Labs  Lab 04/02/21 0250 04/02/21 0927 04/03/21 0017 04/03/21 1119 04/04/21 0925 04/05/21 0353 04/08/21 0728  PROCALCITON 5.83  --   --   --   --   --   --   WBC 28.4*  --    < > 20.3* 15.5* 13.3* 20.3*  LATICACIDVEN  --  1.1  --   --   --   --   --    < > = values in this interval not displayed.    Liver Function Tests: Recent Labs  Lab 04/02/21 0250 04/03/21 0017 04/03/21 1119 04/04/21 0925 04/05/21 0353  AST '20 28 23 15 16  ' ALT '16 17 17 14 12  ' ALKPHOS 108 111 119 103 94  BILITOT 0.5 0.5 0.4 0.5 0.5  PROT 4.1* 4.7* 4.9* 4.8* 4.3*  ALBUMIN <1.5* 1.6* 1.7* 1.6* 1.6*   No results for input(s): LIPASE, AMYLASE in the last 168 hours. No results for input(s): AMMONIA in the last 168 hours.  ABG    Component Value Date/Time   PHART 7.157 (LL) 03/31/2021 1852   PCO2ART 18.3 (LL) 03/31/2021 1852   PO2ART 91.3 03/31/2021 1852   HCO3 3.7 (L) 04/08/2021 9518  TCO2 13 (L) 10/06/2020 1020   ACIDBASEDEF 25.8 (H) 04/08/2021 0728   O2SAT 63.7 04/08/2021 0728      Coagulation Profile: No results for input(s): INR, PROTIME in the last 168 hours.  Cardiac Enzymes: No results for input(s): CKTOTAL, CKMB, CKMBINDEX, TROPONINI in the last 168 hours.  HbA1C: Hemoglobin A1C  Date/Time Value Ref Range Status  11/11/2013 04:31 AM 14.6 (H) 4.2 - 6.3 % Final    Comment:    The American Diabetes Association recommends that a primary goal of therapy should be <7% and that physicians should reevaluate the treatment regimen in patients with HbA1c values consistently >8%.   09/12/2011 02:53 AM SEE COMMENT 4.2 - 6.3 % Final    Comment:    The American Diabetes Association recommends that a primary goal of therapy should be <7% and that physicians should reevaluate the treatment regimen in patients with HbA1c values consistently >8%. HGB A1C - Unable to perform testing at [facility] due  - to interfering substance. HGB A1C - H -- 16.8 %  - Reference Range: 4.8-5.6  - ----------------------------------------  - Increased risk for diabetes: 5.7-6.4  - Diabetes: >6.4  - Glycemic control for adults with  - .[name]... diabetes: <7.0  - ----------------------------------------  - PERFORMED BY [facility] SPEC.NO.: [id number]  - [address] [id number]  - Lindon Romp, MD  - [phone]    HbA1c, POC (controlled diabetic range)  Date/Time Value Ref Range Status  06/04/2018 09:29 AM   Final    Comment:    >15   Hgb A1c MFr Bld  Date/Time Value Ref Range Status  03/07/2021 04:10 PM 10.5 (H) 4.8 - 5.6 % Final    Comment:    (NOTE)         Prediabetes: 5.7 - 6.4         Diabetes: >6.4         Glycemic control for adults with diabetes: <7.0   12/08/2020 04:17 AM 13.0 (H) 4.8 - 5.6 % Final    Comment:    (NOTE) Pre diabetes:          5.7%-6.4%  Diabetes:              >6.4%  Glycemic control for   <7.0% adults with diabetes     CBG: Recent Labs  Lab 04/08/21 1251 04/08/21 1337 04/08/21 1425 04/08/21 1536  04/08/21 1555  GLUCAP >600* >600* >600* >600* >600*       Past Medical History:  She,  has a past medical history of Abscess, gluteal, right (08/24/2013), AKI (acute kidney injury) ([place]) (07/26/2014), Anemia (02/19/2012), Bartholin's gland abscess (09/19/2013), BV (bacterial vaginosis) (11/24/2015), Diabetes mellitus type I ([place]) (2001), Diarrhea (05/30/2016), DKA (diabetic ketoacidoses) (08/19/2013), Gonorrhea (08/2011), History of trichomoniasis (05/31/2016), Hyperlipidemia (03/28/2016), and Sepsis ([place]) (09/19/2013).   Surgical History:   Past Surgical History:  Procedure Laterality Date   CESAREAN SECTION N/A 10/05/2019   Procedure: CESAREAN SECTION;  Surgeon: Aletha Halim, MD;  Location: [facility];  Service: Obstetrics;  Laterality: N/A;   INCISION AND DRAINAGE ABSCESS Left 09/28/2019   Procedure: INCISION AND DRAINAGE VULVAR ABCESS;  Surgeon: [name], MD;  Location: [place];  Service: Gynecology;  Laterality: Left;   INCISION AND DRAINAGE PERIRECTAL ABSCESS Right 08/18/2013   Procedure: IRRIGATION AND DEBRIDEMENT GLUTEAL ABSCESS;  Surgeon: Ralene Ok, MD;  Location: [place];  Service: General;  Laterality: Right;   INCISION AND DRAINAGE PERIRECTAL ABSCESS Right 09/19/2013   Procedure: IRRIGATION AND  DEBRIDEMENT RIGHT GLUTEAL AND LABIAL ABSCESSES;  Surgeon: Ralene Ok, MD;  Location: [place];  Service: General;  Laterality: Right;   INCISION AND DRAINAGE PERIRECTAL ABSCESS Right 09/24/2013   Procedure: IRRIGATION AND DEBRIDEMENT PERIRECTAL ABSCESS;  Surgeon: Gwenyth Ober, MD;  Location: [place];  Service: General;  Laterality: Right;     Social History:   reports that she has never smoked. She has never used smokeless tobacco. She reports that she does not currently use alcohol. She reports that she does not use drugs.   Family History:  Her family history includes Asthma in her mother; Carpal tunnel syndrome in her mother; Diabetes in her paternal grandmother; Gout in her  father. There is no history of Anesthesia problems.   Allergies Allergies  Allergen Reactions   Cephalexin Anaphylaxis   Penicillins Hives and Rash    Has patient had a PCN reaction causing immediate rash, facial/tongue/throat swelling, SOB or lightheadedness with hypotension: Yes Has patient had a PCN reaction causing severe rash involving mucus membranes or skin necrosis: No Has patient had a PCN reaction that required hospitalization: Yes Has patient had a PCN reaction occurring within the last 10 years: No Spoke with pt - childhood hives told by mom, tried no pcns since, doesn't remember reaction herself    Benadryl [Diphenhydramine] Itching   Doxycycline Itching     Home Medications  Prior to Admission medications   Medication Sig Start Date End Date Taking? Authorizing Provider  famotidine (PEPCID) 20 MG tablet Take 1 tablet (20 mg total) by mouth 2 (two) times daily. 04/05/21 05/05/21 Yes Alma Friendly, MD  vancomycin (VANCOCIN) 125 MG capsule Take 1 capsule (125 mg total) by mouth 4 (four) times daily for 5 days. Patient taking differently: Take 125 mg by mouth 4 (four) times daily. Start date : 04/05/21 04/06/21 04/11/21 Yes Alma Friendly, MD  albuterol (VENTOLIN HFA) 108 (90 Base) MCG/ACT inhaler Inhale 2 puffs into the lungs every 4 (four) hours as needed for wheezing or shortness of breath. 08/09/20   [provider]  amLODipine (NORVASC) 10 MG tablet Take 1 tablet (10 mg total) by mouth daily. 03/24/21 04/23/21  Richarda Osmond, MD  blood glucose meter kit and supplies KIT Dispense based on patient and insurance preference. Use up to four times daily as directed. (FOR ICD-9 250.00, 250.01). 05/25/17   Isla Pence, MD  Blood Pressure Monitoring (BLOOD PRESSURE KIT) DEVI 1 Device by Does not apply route as needed. 05/06/19   Anyanwu, Sallyanne Havers, MD  Continuous Blood Gluc Sensor (DEXCOM G6 SENSOR) MISC Inject 1 Device into the skin as directed. 12/16/20    Shamleffer, Melanie Crazier, MD  Continuous Blood Gluc Transmit (DEXCOM G6 TRANSMITTER) MISC Inject 1 Device into the skin as directed. Use to check blood sugar daily 12/16/20   Shamleffer, Melanie Crazier, MD  fluconazole (DIFLUCAN) 200 MG tablet Take 200 mg by mouth daily.    [provider]  gabapentin (NEURONTIN) 300 MG capsule Take 300 mg by mouth 3 (three) times daily.    [provider]  insulin aspart (NOVOLOG) 100 UNIT/ML FlexPen 0-9 Units, Subcutaneous, 3 times daily with meals, CBG < 70: Implement Hypoglycemia measures CBG 70 - 120: 0 units CBG 121 - 150: 1 unit CBG 151 - 200: 2 units CBG 201 - 250: 3 units CBG 251 - 300: 5 units CBG 301 - 350: 7 units CBG 351 - 400: 9 units CBG > 400: call MD 03/29/21   Shamleffer, Mammie Lorenzo  Amedeo Kinsman, MD  Insulin Disposable Pump (OMNIPOD 5 G6 INTRO, GEN 5,) KIT 1 Device by Does not apply route every 3 (three) days. Patient taking differently: 1 Device by Does not apply route every 3 (three) days. 100 units for 72 hours 12/16/20   Shamleffer, Melanie Crazier, MD  Insulin Pen Needle 32G X 4 MM MISC Use as directed 10/07/20   Ghimire, Henreitta Leber, MD  ipratropium (ATROVENT HFA) 17 MCG/ACT inhaler Inhale 1 puff into the lungs every 6 (six) hours. 03/24/21   Richarda Osmond, MD  lamoTRIgine (LAMICTAL) 150 MG tablet Take 150 mg by mouth daily. 03/29/21   [provider]  nystatin (MYCOSTATIN) 100000 UNIT/ML suspension Take 5 mLs (500,000 Units total) by mouth 4 (four) times daily. 04/05/21   Alma Friendly, MD  sucralfate (CARAFATE) 1 g tablet Take 1 tablet (1 g total) by mouth 4 (four) times daily -  with meals and at bedtime. 04/05/21 05/05/21  Alma Friendly, MD  Ferrous Sulfate (IRON) 325 (65 Fe) MG TABS Take 1 tablet (325 mg total) by mouth every other day. Patient not taking: Reported on 07/25/2020 06/17/19 07/25/20  Chauncey Mann, MD  hydrochlorothiazide (HYDRODIURIL) 25 MG tablet Take 1 tablet (25 mg total) by mouth  daily. Patient not taking: No sig reported 10/08/19 07/25/20  Constant, Peggy, MD  promethazine (PHENERGAN) 25 MG tablet Take 1 tablet (25 mg total) by mouth every 8 (eight) hours as needed for nausea or vomiting (if zofran fails). Patient not taking: Reported on 10/06/2020 08/25/20 10/06/20  Varney Biles, MD      The patient is critically ill with multiple organ systems failure and requires high complexity decision making for assessment and support, frequent evaluation and titration of therapies, application of advanced monitoring technologies and extensive interpretation of multiple databases. Critical Care Time devoted to patient care services described in this note is 45 min  minutes.   Christinia Gully, MD Pulmonary and [place] [phone]   After 7:00 pm call Elink  [phone]

## 2021-04-08 NOTE — Progress Notes (Signed)
ANTICOAGULATION CONSULT NOTE - Initial Consult  Pharmacy Consult for Heparin Indication: chest pain/ACS  Allergies  Allergen Reactions   Cephalexin Anaphylaxis   Penicillins Hives and Rash    Has patient had a PCN reaction causing immediate rash, facial/tongue/throat swelling, SOB or lightheadedness with hypotension: Yes Has patient had a PCN reaction causing severe rash involving mucus membranes or skin necrosis: No Has patient had a PCN reaction that required hospitalization: Yes Has patient had a PCN reaction occurring within the last 10 years: No Spoke with pt - childhood hives told by mom, tried no pcns since, doesn't remember reaction herself    Benadryl [Diphenhydramine] Itching   Doxycycline Itching    Patient Measurements: Height: 5\' 3"  (160 cm) Weight: 62 kg (136 lb 11 oz) IBW/kg (Calculated) : 52.4 Heparin Dosing Weight: total weight  Vital Signs: Temp: 93.5 F (34.2 C) (12/10 1300) Temp Source: Rectal (12/10 1300) BP: 108/61 (12/10 1800) Pulse Rate: 99 (12/10 1800)  Labs: Recent Labs    04/08/21 0728 04/08/21 1130 04/08/21 1447 04/08/21 1514 04/08/21 1702  HGB 7.6*  --   --   --   --   HCT 28.8*  --   --   --   --   PLT 371  --   --   --   --   CREATININE 2.78* 2.68* 2.78*  --   --   TROPONINIHS  --   --   --  496* 811*    Estimated Creatinine Clearance: 24.9 mL/min (A) (by C-G formula based on SCr of 2.78 mg/dL (H)).   Medical History: Past Medical History:  Diagnosis Date   Abscess, gluteal, right 08/24/2013   AKI (acute kidney injury) ([place]) 07/26/2014   Anemia 02/19/2012   Bartholin's gland abscess 09/19/2013   BV (bacterial vaginosis) 11/24/2015   Diabetes mellitus type I ([place]) 2001   Diagnosed at age 43 ; Type I   Diarrhea 05/30/2016   DKA (diabetic ketoacidoses) 08/19/2013   Also in 2018   Gonorrhea 08/2011   Treated in 09/2011   History of trichomoniasis 05/31/2016   Hyperlipidemia 03/28/2016   Sepsis ([place]) 09/19/2013    Medications:   Scheduled:   aspirin  325 mg Oral Once   Chlorhexidine Gluconate Cloth  6 each Topical Daily   Infusions:   sodium chloride 125 mL/hr at 04/08/21 1629   dextrose 5% lactated ringers Stopped (04/08/21 0919)   insulin 5.5 Units/hr (04/08/21 1629)   lactated ringers     metronidazole Stopped (04/08/21 1604)   PRN: dextrose, morphine injection, ondansetron (ZOFRAN) IV  Assessment: 30 yo female admitted with DKA, atypical chest pain with elevated troponins.  Pharmacy consulted to dose IV heparin for ACS.  Hgb low at baseline, per discussion with MD, ok to anticoagulate. Plts WNL, no anticoagulants PTA.  Received 5000 units SQH at 15:05 today  Goal of Therapy:  Heparin level 0.3-0.7 units/ml Monitor platelets by anticoagulation protocol: Yes   Plan:  Give 1000 units bolus x 1 Start heparin infusion at 750 units/hr Check anti-Xa level in 8 hours and daily while on heparin Continue to monitor H&H and platelets  Peggyann Juba, PharmD, BCPS Pharmacy: [phone] 04/08/2021,7:06 PM

## 2021-04-08 NOTE — Consult Note (Signed)
Cardiology Consultation:   Patient ID: Ashley Freeman MRN: [id number]; DOB: Jun 25, 1990  Admit date: 04/08/2021 Date of Consult: 04/08/2021  PCP:  Verlee Rossetti, PA-C   [facility] Providers Cardiologist:  None        Patient Profile:   Ashley Freeman is a 30 y.o. female with a hx of T1DM with frequent admissions for DKA, A1c 10.5%, CKD3a, chronic anemia, HTN who is being seen 04/08/2021 for the evaluation of NSTEMI at the request of Dr Margie Ege. Note that this initial consult note is virtual  History of Present Illness:   [name] Has had several admissions for DKA, the most recent of which was 03/31/21 and was provoked by c difficile colitis; discharged on PO vancomycin. In that admission the paitnet was hypotensive and hypothermic with pH 6.9 and WBC 55 requiring ICU admission. Peak troponin on this admission of 220. Presents back with elevated glucose levels and diffuse pain. Found to be in DKA again 12/10 with glucose 1191, bicarb ,7, ph 7.04. Stable hgb of 7.6, no signs of bleeding. Aki cr 2.2. Normotensive. Admitted to the hospitalist service. She missed the past two days of insulin She reports 2 days of chest pain since discharge. This pain is reportedly reproducible with palpation. Troponins 496 -> 811. Cardiology consulted   Past Medical History:  Diagnosis Date   Abscess, gluteal, right 08/24/2013   AKI (acute kidney injury) (HCC) 07/26/2014   Anemia 02/19/2012   Bartholin's gland abscess 09/19/2013   BV (bacterial vaginosis) 11/24/2015   Diabetes mellitus type I (HCC) 2001   Diagnosed at age 83 ; Type I   Diarrhea 05/30/2016   DKA (diabetic ketoacidoses) 08/19/2013   Also in 2018   Gonorrhea 08/2011   Treated in 09/2011   History of trichomoniasis 05/31/2016   Hyperlipidemia 03/28/2016   Sepsis (HCC) 09/19/2013    Past Surgical History:  Procedure Laterality Date   CESAREAN SECTION N/A 10/05/2019   Procedure: CESAREAN  SECTION;  Surgeon:  Bing, MD;  Location: [facility] LD ORS;  Service: Obstetrics;  Laterality: N/A;   INCISION AND DRAINAGE ABSCESS Left 09/28/2019   Procedure: INCISION AND DRAINAGE VULVAR ABCESS;  Surgeon: Tilda Burrow, MD;  Location: [facility] OR;  Service: Gynecology;  Laterality: Left;   INCISION AND DRAINAGE PERIRECTAL ABSCESS Right 08/18/2013   Procedure: IRRIGATION AND DEBRIDEMENT GLUTEAL ABSCESS;  Surgeon: Axel Filler, MD;  Location: [facility] OR;  Service: General;  Laterality: Right;   INCISION AND DRAINAGE PERIRECTAL ABSCESS Right 09/19/2013   Procedure: IRRIGATION AND DEBRIDEMENT RIGHT GLUTEAL AND LABIAL ABSCESSES;  Surgeon: Axel Filler, MD;  Location: [facility] OR;  Service: General;  Laterality: Right;   INCISION AND DRAINAGE PERIRECTAL ABSCESS Right 09/24/2013   Procedure: IRRIGATION AND DEBRIDEMENT PERIRECTAL ABSCESS;  Surgeon: Cherylynn Ridges, MD;  Location: [facility] OR;  Service: General;  Laterality: Right;       Inpatient Medications: Scheduled Meds:  aspirin  325 mg Oral Once   Chlorhexidine Gluconate Cloth  6 each Topical Daily   heparin  5,000 Units Subcutaneous Q8H   Continuous Infusions:  sodium chloride 125 mL/hr at 04/08/21 1629   dextrose 5% lactated ringers Stopped (04/08/21 0919)   insulin 5.5 Units/hr (04/08/21 1629)   lactated ringers     metronidazole Stopped (04/08/21 1604)   PRN Meds: dextrose, morphine injection, ondansetron (ZOFRAN) IV  Allergies:    Allergies  Allergen Reactions   Cephalexin Anaphylaxis   Penicillins Hives and Rash    Has patient had a PCN  reaction causing immediate rash, facial/tongue/throat swelling, SOB or lightheadedness with hypotension: Yes Has patient had a PCN reaction causing severe rash involving mucus membranes or skin necrosis: No Has patient had a PCN reaction that required hospitalization: Yes Has patient had a PCN reaction occurring within the last 10 years: No Spoke with pt - childhood hives told by mom, tried no pcns since,  doesn't remember reaction herself    Benadryl [Diphenhydramine] Itching   Doxycycline Itching    Social History:   Social History   Socioeconomic History   Marital status: Single    Spouse name: Not on file   Number of children: 0   Years of education: 11th grade   Highest education level: Not on file  Occupational History   Occupation: unemployed    Comment: has never worked  Tobacco Use   Smoking status: Never   Smokeless tobacco: Never  Building services engineer Use: Never used  Substance and Sexual Activity   Alcohol use: Not Currently   Drug use: No   Sexual activity: Yes    Birth control/protection: None  Other Topics Concern   Not on file  Social History Narrative   Patient lives in [place] mother lives in [place].  Unemployed.  Previously worked for a Customer service manager.  Completed 11 grade working on [employer]. Patient 3 brothers    Social Determinants of Corporate investment banker Strain: Not on file  Food Insecurity: Not on file  Transportation Needs: Not on file  Physical Activity: Not on file  Stress: Not on file  Social Connections: Not on file  Intimate Partner Violence: Not on file    Family History:   Family History  Problem Relation Age of Onset   Asthma Mother    Carpal tunnel syndrome Mother    Gout Father    Diabetes Paternal Grandmother    Anesthesia problems Neg Hx      ROS:  Please see the history of present illness.  All other ROS reviewed and negative.     Physical Exam/Data:   Vitals:   04/08/21 1500 04/08/21 1600 04/08/21 1700 04/08/21 1800  BP: (!) 117/50 (!) 126/54 (!) 101/43 108/61  Pulse: 100 100 (!) 101 99  Resp: 15 16 12 16   Temp:      TempSrc:      SpO2: 99% 98% 98% 97%  Weight:      Height:        Intake/Output Summary (Last 24 hours) at 04/08/2021 1859 Last data filed at 04/08/2021 1752 Gross per 24 hour  Intake 2848.08 ml  Output 555 ml  Net 2293.08 ml   Last 3 Weights 04/08/2021 04/05/2021 04/04/2021  Weight  (lbs) 136 lb 11 oz 136 lb 7.4 oz 135 lb 9.3 oz  Weight (kg) 62 kg 61.9 kg 61.5 kg     Body mass index is 24.21 kg/m.  Physical exam not performed for this virtual consult  EKG:  The EKG was personally reviewed and demonstrates:  sinus tachycardia unchanged for 12/5 EKG  Relevant CV Studies:  Laboratory Data:  High Sensitivity Troponin:   Recent Labs  Lab 03/20/21 2223 03/21/21 0216 04/03/21 0017 04/08/21 1514 04/08/21 1702  TROPONINIHS 122* 220* 7 496* 811*     Chemistry Recent Labs  Lab 04/04/21 0925 04/05/21 0353 04/08/21 0725 04/08/21 0728 04/08/21 1130 04/08/21 1447  NA 135 135  --  126* 128* 131*  K 3.8 3.6  --  5.9* 4.7 4.0  CL 110 111  --  98 101 105  CO2 19* 21*  --  <7* <7* 8*  GLUCOSE 135* 60*  --  1,191* 1,123* 963*  BUN 19 16  --  27* 27* 28*  CREATININE 1.38* 1.36*  --  2.78* 2.68* 2.78*  CALCIUM 7.5* 7.5*  --  8.2* 7.9* 8.2*  MG 1.6* 1.7 2.1  --   --   --   GFRNONAA 53* 54*  --  23* 24* 23*  ANIONGAP 6 3*  --  NOT CALCULATED NOT CALCULATED 18*    Recent Labs  Lab 04/03/21 1119 04/04/21 0925 04/05/21 0353  PROT 4.9* 4.8* 4.3*  ALBUMIN 1.7* 1.6* 1.6*  AST 23 15 16   ALT 17 14 12   ALKPHOS 119 103 94  BILITOT 0.4 0.5 0.5   Lipids No results for input(s): CHOL, TRIG, HDL, LABVLDL, LDLCALC, CHOLHDL in the last 168 hours.  Hematology Recent Labs  Lab 04/04/21 0925 04/05/21 0353 04/08/21 0728  WBC 15.5* 13.3* 20.3*  RBC 3.29* 2.84* 2.82*  HGB 9.0* 7.7* 7.6*  HCT 26.9* 23.8* 28.8*  MCV 81.8 83.8 102.1*  MCH 27.4 27.1 27.0  MCHC 33.5 32.4 26.4*  RDW 15.2 15.3 16.4*  PLT 275 272 371   Thyroid No results for input(s): TSH, FREET4 in the last 168 hours.  BNPNo results for input(s): BNP, PROBNP in the last 168 hours.  DDimer No results for input(s): DDIMER in the last 168 hours.   Radiology/Studies:  [place] EKG SITE RITE  Result Date: 04/08/2021 If Site Rite image not attached, placement could not be confirmed due to current cardiac rhythm.     Assessment and Plan:  Ms Cottom is a 69 YOF hx T1DM A1c 10.5% with frequent DKA admissions, recent DKA from C diff colitis on PO vancomycin, chronic macrocytic anemia, CKD3a. She presents with recurrent DKA and two days of insulin non-compliance with new reportedly atypical chest pain and troponin elevation 400s-> 800s. This could certainly represent demand ischemia (T2 NSTEMI) in the setting of DKA. However, in the ICU admission two weeks ago when Ms Porfirio Mylar was sicker the peak troponin was only 220. Despite her age, given longstanding uncontrolled diabetes, coronary artery disease is very possible. NSTEMI is a common precipitant of DKA in this population. I think a T1 NSTEMI is certainly in the differential    NSTEMI, T1 vs T2: Plan to medically manage as if [facility] if having ACS. Would give aspirin 325 followed by 81 daily and heparin gtt. Would also start high-intensity statin. Hgb is at baseline and no signs of active bleeding. Hold on LHC at this time given significant AKI as well as medication non-compliance; would be concerned about PCI since deployment of a stent would require uninterrupted DAPT. I think it is reasonable to keep the patient at [facility] for now and if there is need for inpatient LHC can transfer at that time. Please obtain TTE, trend troponins to peak, obtain serial EKGs. Hypertension: in setting of DKA would hold HCTZ; this is a poor medication choice in a patient with recurrent DKA since it increases glucose and causes diuresis. Can continue amlodipine if BP will tolerate DKA, c diff colitis, AKI: management per primary team.   Risk Assessment/Risk Scores:     TIMI Risk Score for Unstable Angina or Non-ST Elevation MI:   The patient's TIMI risk score is  , which indicates a  % risk of all cause mortality, new or recurrent myocardial infarction or need for urgent revascularization in the next 14  days.          For questions or updates,  please contact [facility] Please consult [email or web address] for contact info under    Signed, [place] Brandon Wiechman, MD  04/08/2021 6:59 PM

## 2021-04-08 NOTE — ED Triage Notes (Signed)
Patient BIB [facility] from home. Patients insulin pump failed, this caused her to have to take injections. Type 1 diabetic. Patient said she is in pain everywhere and is demanding pain medication on arrival.

## 2021-04-08 NOTE — Progress Notes (Signed)
Date and time results received: 04/08/21 1614 (use smartphrase ".now" to insert current time)  Test: Glucose Critical Value: 963  Name of Provider Notified: Dr. Marylyn Ishihara  Orders Received? Or Actions Taken?:  No new orders at this time.

## 2021-04-08 NOTE — Progress Notes (Signed)
Previously spoke with Lysbeth Galas, RN regarding PICC insertion with plan to place this afternoon.  Spoke with Lysbeth Galas again at this time, discussed current workload, per Lysbeth Galas, RN it is okay to plan to place PICC tomorrow.

## 2021-04-08 NOTE — ED Notes (Signed)
IV team at bedside 

## 2021-04-08 NOTE — ED Notes (Signed)
Paged admitting provider regarding pt's blood sugar.

## 2021-04-08 NOTE — Progress Notes (Signed)
Inpatient Diabetes Program Recommendations  AACE/ADA: New Consensus Statement on Inpatient Glycemic Control (2015)  Target Ranges:  Prepandial:   less than 140 mg/dL      Peak postprandial:   less than 180 mg/dL (1-2 hours)      Critically ill patients:  140 - 180 mg/dL   Lab Results  Component Value Date   GLUCAP >600 ([place]) 04/08/2021   HGBA1C 10.5 (H) 03/07/2021    Review of Glycemic Control  Diabetes history: type 1 Outpatient Diabetes medications: Omnipod insulin pump Current orders for Inpatient glycemic control: IV insulin drip  Inpatient Diabetes Program Recommendations:   Noted that patient was inpatient and discharged on 04/05/2021. Patient was on SQ insulin in the hospital.  When patient is ready to transition off IV insulin drip: Recommend Semglee 12 units daily, Novolog 0-9 units correction scale TID, Novolog 0-5 units HS scale, Novolog 2 units TID with meals if eating at least 50% of meal.  Insulin pump settings: 00:00 0.5 units/hr. 0800  0.6 units/hr.    Total basal: 13.6 units 1:12 grams CHO 1:65 sensitivity Target goal: 100 mg/dl.  Will continue to monitor blood sugars while in the hospital.  Harvel Ricks RN BSN CDE Diabetes Coordinator Pager: [phone]  8am-5pm

## 2021-04-08 NOTE — ED Notes (Signed)
Melody, RN at bedside attempting [place] IV

## 2021-04-08 NOTE — Progress Notes (Signed)
Date and time results received: 04/08/21 1650 (use smartphrase ".now" to insert current time)  Test: Troponin Critical Value: 496  Name of Provider Notified: Dr. Marylyn Ishihara  Orders Received? Or Actions Taken?:

## 2021-04-09 ENCOUNTER — Inpatient Hospital Stay (HOSPITAL_COMMUNITY): Payer: Medicaid Other

## 2021-04-09 DIAGNOSIS — R079 Chest pain, unspecified: Secondary | ICD-10-CM | POA: Diagnosis not present

## 2021-04-09 DIAGNOSIS — M60831 Other myositis, right forearm: Secondary | ICD-10-CM

## 2021-04-09 DIAGNOSIS — N179 Acute kidney failure, unspecified: Secondary | ICD-10-CM | POA: Diagnosis not present

## 2021-04-09 DIAGNOSIS — E1010 Type 1 diabetes mellitus with ketoacidosis without coma: Secondary | ICD-10-CM | POA: Diagnosis not present

## 2021-04-09 DIAGNOSIS — R778 Other specified abnormalities of plasma proteins: Secondary | ICD-10-CM | POA: Diagnosis not present

## 2021-04-09 DIAGNOSIS — R072 Precordial pain: Secondary | ICD-10-CM

## 2021-04-09 DIAGNOSIS — R0789 Other chest pain: Secondary | ICD-10-CM | POA: Diagnosis not present

## 2021-04-09 LAB — GLUCOSE, CAPILLARY
Glucose-Capillary: 103 mg/dL — ABNORMAL HIGH (ref 70–99)
Glucose-Capillary: 103 mg/dL — ABNORMAL HIGH (ref 70–99)
Glucose-Capillary: 121 mg/dL — ABNORMAL HIGH (ref 70–99)
Glucose-Capillary: 129 mg/dL — ABNORMAL HIGH (ref 70–99)
Glucose-Capillary: 133 mg/dL — ABNORMAL HIGH (ref 70–99)
Glucose-Capillary: 137 mg/dL — ABNORMAL HIGH (ref 70–99)
Glucose-Capillary: 143 mg/dL — ABNORMAL HIGH (ref 70–99)
Glucose-Capillary: 144 mg/dL — ABNORMAL HIGH (ref 70–99)
Glucose-Capillary: 152 mg/dL — ABNORMAL HIGH (ref 70–99)
Glucose-Capillary: 189 mg/dL — ABNORMAL HIGH (ref 70–99)
Glucose-Capillary: 223 mg/dL — ABNORMAL HIGH (ref 70–99)
Glucose-Capillary: 276 mg/dL — ABNORMAL HIGH (ref 70–99)

## 2021-04-09 LAB — BASIC METABOLIC PANEL
Anion gap: 9 (ref 5–15)
Anion gap: 8 (ref 5–15)
Anion gap: 5 (ref 5–15)
BUN: 25 mg/dL — ABNORMAL HIGH (ref 6–20)
BUN: 25 mg/dL — ABNORMAL HIGH (ref 6–20)
BUN: 24 mg/dL — ABNORMAL HIGH (ref 6–20)
CO2: 17 mmol/L — ABNORMAL LOW (ref 22–32)
CO2: 17 mmol/L — ABNORMAL LOW (ref 22–32)
CO2: 19 mmol/L — ABNORMAL LOW (ref 22–32)
Calcium: 7.8 mg/dL — ABNORMAL LOW (ref 8.9–10.3)
Calcium: 7.9 mg/dL — ABNORMAL LOW (ref 8.9–10.3)
Calcium: 7.8 mg/dL — ABNORMAL LOW (ref 8.9–10.3)
Chloride: 110 mmol/L (ref 98–111)
Chloride: 110 mmol/L (ref 98–111)
Chloride: 110 mmol/L (ref 98–111)
Creatinine, Ser: 2.71 mg/dL — ABNORMAL HIGH (ref 0.44–1.00)
Creatinine, Ser: 2.46 mg/dL — ABNORMAL HIGH (ref 0.44–1.00)
Creatinine, Ser: 2.54 mg/dL — ABNORMAL HIGH (ref 0.44–1.00)
GFR, Estimated: 24 mL/min — ABNORMAL LOW (ref >60)
GFR, Estimated: 27 mL/min — ABNORMAL LOW (ref >60)
GFR, Estimated: 26 mL/min — ABNORMAL LOW (ref >60)
Glucose, Bld: 217 mg/dL — ABNORMAL HIGH (ref 70–99)
Glucose, Bld: 130 mg/dL — ABNORMAL HIGH (ref 70–99)
Glucose, Bld: 152 mg/dL — ABNORMAL HIGH (ref 70–99)
Potassium: 3.1 mmol/L — ABNORMAL LOW (ref 3.5–5.1)
Potassium: 3.6 mmol/L (ref 3.5–5.1)
Potassium: 3.7 mmol/L (ref 3.5–5.1)
Sodium: 136 mmol/L (ref 135–145)
Sodium: 135 mmol/L (ref 135–145)
Sodium: 134 mmol/L — ABNORMAL LOW (ref 135–145)

## 2021-04-09 LAB — RETICULOCYTES
Immature Retic Fract: 32.3 % — ABNORMAL HIGH (ref 2.3–15.9)
RBC.: 2.6 MIL/uL — ABNORMAL LOW (ref 3.87–5.11)
Retic Count, Absolute: 61.6 10*3/uL (ref 19.0–186.0)
Retic Ct Pct: 2.4 % (ref 0.4–3.1)

## 2021-04-09 LAB — FERRITIN: Ferritin: 340 ng/mL — ABNORMAL HIGH (ref 11–307)

## 2021-04-09 LAB — ECHOCARDIOGRAM COMPLETE
Area-P 1/2: 5.23 cm2
Height: 63 in
S' Lateral: 3.2 cm
Weight: 2186.96 oz

## 2021-04-09 LAB — CBC
HCT: 22.9 % — ABNORMAL LOW (ref 36.0–46.0)
Hemoglobin: 7.2 g/dL — ABNORMAL LOW (ref 12.0–15.0)
MCH: 26.9 pg (ref 26.0–34.0)
MCHC: 31.4 g/dL (ref 30.0–36.0)
MCV: 85.4 fL (ref 80.0–100.0)
Platelets: 369 10*3/uL (ref 150–400)
RBC: 2.68 MIL/uL — ABNORMAL LOW (ref 3.87–5.11)
RDW: 15.6 % — ABNORMAL HIGH (ref 11.5–15.5)
WBC: 21.7 10*3/uL — ABNORMAL HIGH (ref 4.0–10.5)
nRBC: 0 % (ref 0.0–0.2)

## 2021-04-09 LAB — VITAMIN B12: Vitamin B-12: 935 pg/mL — ABNORMAL HIGH (ref 180–914)

## 2021-04-09 LAB — IRON AND TIBC
Iron: 45 ug/dL (ref 28–170)
Saturation Ratios: 37 % — ABNORMAL HIGH (ref 10.4–31.8)
TIBC: 123 ug/dL — ABNORMAL LOW (ref 250–450)
UIBC: 78 ug/dL

## 2021-04-09 LAB — SAVE SMEAR(SSMR), FOR PROVIDER SLIDE REVIEW

## 2021-04-09 LAB — HEMOGLOBIN AND HEMATOCRIT, BLOOD
HCT: 21.4 % — ABNORMAL LOW (ref 36.0–46.0)
HCT: 21.9 % — ABNORMAL LOW (ref 36.0–46.0)
Hemoglobin: 6.7 g/dL — CL (ref 12.0–15.0)
Hemoglobin: 7.1 g/dL — ABNORMAL LOW (ref 12.0–15.0)

## 2021-04-09 LAB — FOLATE: Folate: 6.5 ng/mL

## 2021-04-09 LAB — HEPARIN LEVEL (UNFRACTIONATED): Heparin Unfractionated: 0.35 [IU]/mL (ref 0.30–0.70)

## 2021-04-09 LAB — BETA-HYDROXYBUTYRIC ACID: Beta-Hydroxybutyric Acid: 0.07 mmol/L (ref 0.05–0.27)

## 2021-04-09 LAB — PREPARE RBC (CROSSMATCH)

## 2021-04-09 MED ORDER — SODIUM CHLORIDE 0.9% IV SOLUTION: Freq: Once | INTRAVENOUS | Status: DC

## 2021-04-09 MED ORDER — METOCLOPRAMIDE HCL 5 MG/ML IJ SOLN
5.0000 mg | Freq: Three times a day (TID) | INTRAMUSCULAR | Status: DC
Start: 2021-04-09 — End: 2021-04-10

## 2021-04-09 MED ORDER — IBUPROFEN 800 MG PO TABS
800.0000 mg | ORAL_TABLET | Freq: Three times a day (TID) | ORAL | Status: DC
  Administered 2021-04-09: 800 mg via ORAL
  Filled 2021-04-09: qty 1

## 2021-04-09 MED ORDER — INSULIN ASPART 100 UNIT/ML IJ SOLN
2.0000 [IU] | Freq: Three times a day (TID) | INTRAMUSCULAR | Status: DC
  Administered 2021-04-09 (×2): 2 [IU] via SUBCUTANEOUS

## 2021-04-09 MED ORDER — SODIUM CHLORIDE 0.9 % IV SOLN
1.0000 g | Freq: Two times a day (BID) | INTRAVENOUS | Status: DC
  Administered 2021-04-09: 1 g via INTRAVENOUS
  Filled 2021-04-09 (×2): qty 1

## 2021-04-09 MED ORDER — POTASSIUM CHLORIDE 10 MEQ/100ML IV SOLN
10.0000 meq | INTRAVENOUS | Status: AC
  Administered 2021-04-09 (×4): 10 meq via INTRAVENOUS
  Filled 2021-04-09 (×4): qty 100

## 2021-04-09 MED ORDER — CLINDAMYCIN PHOSPHATE 600 MG/50ML IV SOLN: 600.0000 mg | Freq: Three times a day (TID) | INTRAVENOUS | Status: DC

## 2021-04-09 MED ORDER — MEPERIDINE HCL 50 MG/ML IJ SOLN
50.0000 mg | Freq: Once | INTRAMUSCULAR | Status: AC
  Administered 2021-04-09: 50 mg via INTRAMUSCULAR
  Filled 2021-04-09: qty 1

## 2021-04-09 MED ORDER — INSULIN GLARGINE-YFGN 100 UNIT/ML ~~LOC~~ SOLN
10.0000 [IU] | Freq: Every day | SUBCUTANEOUS | Status: DC
  Administered 2021-04-09: 10 [IU] via SUBCUTANEOUS
  Filled 2021-04-09 (×2): qty 0.1

## 2021-04-09 MED ORDER — HALOPERIDOL LACTATE 5 MG/ML IJ SOLN
5.0000 mg | Freq: Once | INTRAMUSCULAR | Status: AC
  Administered 2021-04-09: 5 mg via INTRAMUSCULAR
  Filled 2021-04-09: qty 1

## 2021-04-09 MED ORDER — METOCLOPRAMIDE HCL 5 MG/ML IJ SOLN
5.0000 mg | Freq: Four times a day (QID) | INTRAMUSCULAR | Status: DC | PRN
  Administered 2021-04-09: 5 mg via INTRAVENOUS
  Filled 2021-04-09: qty 2

## 2021-04-09 MED ORDER — INSULIN ASPART 100 UNIT/ML IJ SOLN: 0.0000 [IU] | Freq: Three times a day (TID) | INTRAMUSCULAR | Status: DC

## 2021-04-09 MED ORDER — INSULIN ASPART 100 UNIT/ML IJ SOLN: 0.0000 [IU] | Freq: Every day | INTRAMUSCULAR | Status: DC

## 2021-04-09 MED ORDER — LINEZOLID 600 MG/300ML IV SOLN
600.0000 mg | Freq: Two times a day (BID) | INTRAVENOUS | Status: DC
  Filled 2021-04-09 (×2): qty 300

## 2021-04-09 NOTE — Discharge Instructions (Signed)
Go to [facility] and get evaluated by Copy.

## 2021-04-09 NOTE — Progress Notes (Signed)
Received [facility] consult to start PIV in left upper arm for blood transfusion. Recently blood transfusion infiltrated in right upper arm. Assessed left upper arm with ultrasound. Left arm also edematous,no suitable veins found. Primary RN notified. MD aware.

## 2021-04-09 NOTE — Progress Notes (Signed)
[place] for Heparin Indication: chest pain/ACS  Allergies  Allergen Reactions   Cephalexin Anaphylaxis   Penicillins Hives and Rash    Has patient had a PCN reaction causing immediate rash, facial/tongue/throat swelling, SOB or lightheadedness with hypotension: Yes Has patient had a PCN reaction causing severe rash involving mucus membranes or skin necrosis: No Has patient had a PCN reaction that required hospitalization: Yes Has patient had a PCN reaction occurring within the last 10 years: No Spoke with pt - childhood hives told by mom, tried no pcns since, doesn't remember reaction herself    Benadryl [Diphenhydramine] Itching   Doxycycline Itching    Patient Measurements: Height: 5\' 3"  (160 cm) Weight: 62 kg (136 lb 11 oz) IBW/kg (Calculated) : 52.4 Heparin Dosing Weight: total weight  Vital Signs: Temp: 98.4 F (36.9 C) (12/11 0400) Temp Source: Oral (12/11 0400) BP: 100/49 (12/11 0500) Pulse Rate: 109 (12/11 0500)  Labs: Recent Labs    04/08/21 0728 04/08/21 1130 04/08/21 1447 04/08/21 1514 04/08/21 1702 04/08/21 1957 04/08/21 2156 04/09/21 0139 04/09/21 0534  HGB 7.6*  --   --   --   --  7.2*  --  6.7*  --   HCT 28.8*  --   --   --   --  23.1*  --  21.4*  --   PLT 371  --   --   --   --   --   --   --   --   APTT  --   --   --   --   --  23*  --   --   --   LABPROT  --   --   --   --   --  15.3*  --   --   --   INR  --   --   --   --   --  1.2  --   --   --   HEPARINUNFRC  --   --   --   --   --   --   --   --  0.35  CREATININE 2.78*   < > 2.78*  --   --   --  2.70* 2.71*  --   TROPONINIHS  --   --   --    < > 811* 1,071* 1,032*  --   --    < > = values in this interval not displayed.     Estimated Creatinine Clearance: 25.6 mL/min (A) (by C-G formula based on SCr of 2.71 mg/dL (H)).   Medical History: Past Medical History:  Diagnosis Date   Abscess, gluteal, right 08/24/2013   AKI (acute kidney injury) ([place])  07/26/2014   Anemia 02/19/2012   Bartholin's gland abscess 09/19/2013   BV (bacterial vaginosis) 11/24/2015   Diabetes mellitus type I ([place]) 2001   Diagnosed at age 65 ; Type I   Diarrhea 05/30/2016   DKA (diabetic ketoacidoses) 08/19/2013   Also in 2018   Gonorrhea 08/2011   Treated in 09/2011   History of trichomoniasis 05/31/2016   Hyperlipidemia 03/28/2016   Sepsis ([place]) 09/19/2013    Medications:  Scheduled:   sodium chloride   Intravenous Once   aspirin  325 mg Oral Once   Chlorhexidine Gluconate Cloth  6 each Topical Daily   sucralfate  1 g Oral TID WC & HS   Infusions:   sodium chloride Stopped (04/09/21 0116)   dextrose 5% lactated  ringers 125 mL/hr at 04/09/21 0246   famotidine (PEPCID) IV Stopped (04/09/21 0005)   heparin 750 Units/hr (04/09/21 0246)   insulin 2.6 Units/hr (04/09/21 0246)   lactated ringers     metronidazole Stopped (04/08/21 2317)   potassium chloride 10 mEq (04/09/21 0549)   PRN: dextrose, morphine injection, ondansetron (ZOFRAN) IV  Assessment: 30 yo female admitted with DKA, atypical chest pain with elevated troponins.  Pharmacy consulted to dose IV heparin for ACS.  Hgb low at baseline, per discussion with MD, ok to anticoagulate. Plts WNL, no anticoagulants PTA.  04/09/2021: Heparin level 0.35- therapeutic on IV heparin 750 units/hr CBC: Hg now <7 (6.7).  Plan to transfuse.  No bleeding or infusion interruptions per RN  Goal of Therapy:  Heparin level 0.3-0.7 units/ml Monitor platelets by anticoagulation protocol: Yes   Plan:  Continue heparin 750 units/hr Recheck confirmatory heparin level at 1400  Daily heparin level & CBC while on heparin  Netta Cedars, PharmD, BCPS Pharmacy: [phone] 04/09/2021,6:11 AM

## 2021-04-09 NOTE — Procedures (Signed)
Central Venous Catheter Insertion Procedure Note  Ashley Freeman  [id number]  10-Feb-1991  Date:04/09/21  Time:3:33 PM   Provider Performing:Lakecia Deschamps   Procedure: Insertion of Non-tunneled Central Venous [phone]) with US guidance (51834)   Indication(s) Difficult access  Consent Risks of the procedure as well as the alternatives and risks of each were explained to the patient and/or caregiver.  Consent for the procedure was obtained and is signed in the bedside chart  Anesthesia Topical only with 1% lidocaine   Timeout Verified patient identification, verified procedure, site/side was marked, verified correct patient position, special equipment/implants available, medications/allergies/relevant history reviewed, required imaging and test results available.  Sterile Technique Maximal sterile technique including full sterile barrier drape, hand hygiene, sterile gown, sterile gloves, mask, hair covering, sterile ultrasound probe cover (if used).  Procedure Description Area of catheter insertion was cleaned with chlorhexidine and draped in sterile fashion.  With real-time ultrasound guidance a central venous catheter was placed into the left internal jugular vein. Nonpulsatile blood flow and easy flushing noted in all ports.  The catheter was sutured in place and sterile dressing applied.  Complications/Tolerance None; patient tolerated the procedure well. Chest X-ray is ordered to verify placement for internal jugular or subclavian cannulation.   Chest x-ray is not ordered for femoral cannulation.    EBL Minimal  Specimen(s) None    Richardson Landry Izick Gasbarro ACNP Acute Care Nurse Practitioner [place] Please consult Amion 04/09/2021, 3:35 PM

## 2021-04-09 NOTE — Progress Notes (Addendum)
Earlier in night arrived at pt's room to start PIV.Pt. has order for a PICC but it will not be placed till 12/11.Looked in [place] using U/S to assess for adequate vein to stick .. 1 Vein noted was very deep and touching a artery. I did not feel I could be successful in hitting the vein. Did attempt another stick in L arm without success. Finally was able to place a #24 Gauge in RAF. Explained the above to the pt's RN. When the midline was ordered at 0430 I secure chatted the pt's RN and reminded him that I would not be able to place a midline because of earlier assessment. He said that the pt.could wait till day shift when PICC would be placed.

## 2021-04-09 NOTE — Progress Notes (Signed)
Spoke with [employer]. Pt accepted by [facility] Surgeon Wadie Lessen for emergent arm evaluation. Pt to be transported to [facility] ED. Report called by Kelli Churn RN to ED Charge RN at [phone]. [facility] in route to pick up patient from 1228.

## 2021-04-09 NOTE — Progress Notes (Signed)
Alexis RN responded to pt crying in her room. She expressed that her right arm was in pain. Alexis RN assessed arm and found it to be taunt and tender. She elevated that extremity and paged the provider. [place] DO assessed the arm and ordered a CT of the RUE. At [place] removed the two IV's in that arm.

## 2021-04-09 NOTE — Progress Notes (Signed)
I was contacted by Dr. [place] for patient's painful and swollen forearm. Per Dr. [place]), patient began having pain this AM, following a blood transfusion when her IV infiltrated.   Per CT read read of forearm, there is "diffuse enlargement and heterogeneity of the forearm musculature involving both the flexor and extensor compartments. Numerous foci of soft tissue gas within both compartments, more pronounced within the flexor compartment of the proximal to mid right forearm. Appearance is suggestive of diffuse myositis with infectious pyomyositis or myonecrosis."  I discussed with Dr. [place] that this patient's condition is outside my scope of practice, and that she should be evaluated, and potentially operated on, by a hand specialist, which I am not. I then discussed the patient's situation with a hand fellow at [facility], Dr. Ala Bent. He informed me that despite CT findings, the need for emergent or urgent surgery would be dependent on the patient's clinical findings. I discussed with him that making that determination is outside my scope of practice, and that I would be extremely uncomfortable attempting to make that decision, given the fact that I am a spine specialist and do not have any experience treating conditions such as this one. He graciously offered to have the patient transferred to [facility], so that he can evaluate the patient and make that determination, and potentially proceed with surgery if indicated. I expressed my appreciation to him, and let him know that I strongly feel that this course of action would certainly be the most optimal plan for the patient. That is currently the plan - to transfer the patient to [facility] expeditiously for him and his attending to evaluate. If he and his attending determine that surgery is not needed, he will arrange having the patient transferred back to [facility] or [facility] hospital for medical management of the  patient's various medical issues. I very much appreciate the conversation with Dr. Ala Bent, and his willingness to allow this patient to receive the highest level of care possible.

## 2021-04-09 NOTE — Consult Note (Signed)
CARDIOLOGY CONSULT NOTE       Patient ID: Ashley Freeman MRN: [id number] DOB/AGE: 09-11-1990 30 y.o.  Admit date: 04/08/2021 Referring Physician: Marylyn Ishihara Primary Physician: [name], PA-C Primary Cardiologist: Ellyn Hack Reason for Consultation: Chest pain elevated troponin   Active Problems:   Atypical chest pain   AKI (acute kidney injury) ([place])   DKA (diabetic ketoacidosis) ([place])   HPI:  30 y.o. with recurrent admission for DKA due to non compliance and running out of insulin Recent d/c for C diff colitis on PO vanc Admitted with body aches N/V and diarrhea BS on admission 1191 with bicarb <7 pH 7.04 consistent with DKA She has had non anginal constant chest pains all over Pain to palpation Troponin chronically elevated 220 2 weeks ago and now 1032   ECG with SR rate 103 nonspecific ST changes no changes to suggest pericarditis Echo 04/01/21 with EF 65-70% normal RV no significant valve dx or effusion   Hct fallen from 28 to 21.4 over the last week to be transfused not iron deficient   As far as I know has not been diagnosed with sickle cell   ROS All other systems reviewed and negative except as noted above  Past Medical History:  Diagnosis Date   Abscess, gluteal, right 08/24/2013   AKI (acute kidney injury) ([place]) 07/26/2014   Anemia 02/19/2012   Bartholin's gland abscess 09/19/2013   BV (bacterial vaginosis) 11/24/2015   Diabetes mellitus type I ([place]) 2001   Diagnosed at age 64 ; Type I   Diarrhea 05/30/2016   DKA (diabetic ketoacidoses) 08/19/2013   Also in 2018   Gonorrhea 08/2011   Treated in 09/2011   History of trichomoniasis 05/31/2016   Hyperlipidemia 03/28/2016   Sepsis ([place]) 09/19/2013    Family History  Problem Relation Age of Onset   Asthma Mother    Carpal tunnel syndrome Mother    Gout Father    Diabetes Paternal Grandmother    Anesthesia problems Neg Hx     Social History   Socioeconomic History   Marital status: Single    Spouse  name: Not on file   Number of children: 0   Years of education: 11th grade   Highest education level: Not on file  Occupational History   Occupation: unemployed    Comment: has never worked  Tobacco Use   Smoking status: Never   Smokeless tobacco: Never  Scientific laboratory technician Use: Never used  Substance and Sexual Activity   Alcohol use: Not Currently   Drug use: No   Sexual activity: Yes    Birth control/protection: None  Other Topics Concern   Not on file  Social History Narrative   Patient lives in [place] mother lives in [place].  Unemployed.  Previously worked for a IT consultant.  Completed 11 grade working on [employer]. Patient 3 brothers    Social Determinants of Health   Financial Resource Strain: Not on file  Food Insecurity: Not on file  Transportation Needs: Not on file  Physical Activity: Not on file  Stress: Not on file  Social Connections: Not on file  Intimate Partner Violence: Not on file    Past Surgical History:  Procedure Laterality Date   CESAREAN SECTION N/A 10/05/2019   Procedure: CESAREAN SECTION;  Surgeon: Aletha Halim, MD;  Location: [facility];  Service: Obstetrics;  Laterality: N/A;   INCISION AND DRAINAGE ABSCESS Left 09/28/2019   Procedure: INCISION AND DRAINAGE VULVAR ABCESS;  Surgeon: Mallory Shirk  V, MD;  Location: [facility];  Service: Gynecology;  Laterality: Left;   INCISION AND DRAINAGE PERIRECTAL ABSCESS Right 08/18/2013   Procedure: IRRIGATION AND DEBRIDEMENT GLUTEAL ABSCESS;  Surgeon: Ralene Ok, MD;  Location: [facility];  Service: General;  Laterality: Right;   INCISION AND DRAINAGE PERIRECTAL ABSCESS Right 09/19/2013   Procedure: IRRIGATION AND DEBRIDEMENT RIGHT GLUTEAL AND LABIAL ABSCESSES;  Surgeon: Ralene Ok, MD;  Location: [facility];  Service: General;  Laterality: Right;   INCISION AND DRAINAGE PERIRECTAL ABSCESS Right 09/24/2013   Procedure: IRRIGATION AND DEBRIDEMENT PERIRECTAL ABSCESS;  Surgeon: Gwenyth Ober, MD;  Location: [facility];  Service: General;  Laterality: Right;      Current Facility-Administered Medications:    0.9 %  sodium chloride infusion (Manually program via Guardrails IV Fluids), , Intravenous, Once, Blount, Xenia T, NP   0.9 %  sodium chloride infusion, , Intravenous, Continuous, Kyle, Tyrone A, DO, Stopped at 04/09/21 0116   aspirin tablet 325 mg, 325 mg, Oral, Once, Kyle, Tyrone A, DO   Chlorhexidine Gluconate Cloth 2 % PADS 6 each, 6 each, Topical, Daily, Kyle, Tyrone A, DO, 6 each at 04/08/21 1244   dextrose 5 % in lactated ringers infusion, , Intravenous, Continuous, Kyle, Tyrone A, DO, Last Rate: 125 mL/hr at 04/09/21 0801, Infusion Verify at 04/09/21 0801   dextrose 50 % solution 0-50 mL, 0-50 mL, Intravenous, PRN, Marylyn Ishihara, Tyrone A, DO   famotidine (PEPCID) IVPB 20 mg premix, 20 mg, Intravenous, Q24H, Tanda Rockers, MD, Stopped at 04/09/21 0005   heparin ADULT infusion 100 units/mL (25000 units/249mL), 750 Units/hr, Intravenous, Continuous, Emiliano Dyer, RPH, Last Rate: 7.5 mL/hr at 04/09/21 0801, 750 Units/hr at 04/09/21 0801   insulin aspart (novoLOG) injection 0-5 Units, 0-5 Units, Subcutaneous, QHS, [place] Eric J, DO   insulin aspart (novoLOG) injection 0-6 Units, 0-6 Units, Subcutaneous, TID WC, [place] Eric J, DO   insulin aspart (novoLOG) injection 2 Units, 2 Units, Subcutaneous, TID WC, [place] Eric J, DO   insulin glargine-yfgn (SEMGLEE) injection 10 Units, 10 Units, Subcutaneous, Daily, [place] Eric J, DO   insulin regular, human (MYXREDLIN) 100 units/ 100 mL infusion, , Intravenous, Continuous, Kyle, Tyrone A, DO, Last Rate: 1 mL/hr at 04/09/21 0801, 1 Units/hr at 04/09/21 0801   lactated ringers bolus 2,000 mL, 2,000 mL, Intravenous, Once, Kyle, Tyrone A, DO   metoCLOPramide (REGLAN) injection 5 mg, 5 mg, Intravenous, Q6H PRN, [place] Eric J, DO   metroNIDAZOLE (FLAGYL) IVPB 500 mg, 500 mg, Intravenous, Q8H, Kyle, Tyrone A, DO, Stopped at 04/09/21 0755   morphine 4 MG/ML  injection 4 mg, 4 mg, Intravenous, Q2H PRN, Marylyn Ishihara, Tyrone A, DO, 4 mg at 04/09/21 0537   ondansetron (ZOFRAN) injection 4 mg, 4 mg, Intravenous, Q8H PRN, Marylyn Ishihara, Tyrone A, DO, 4 mg at 04/09/21 0819   sucralfate (CARAFATE) 1 GM/10ML suspension 1 g, 1 g, Oral, TID WC & HS, Wert, Michael B, MD  sodium chloride   Intravenous Once   aspirin  325 mg Oral Once   Chlorhexidine Gluconate Cloth  6 each Topical Daily   insulin aspart  0-5 Units Subcutaneous QHS   insulin aspart  0-6 Units Subcutaneous TID WC   insulin aspart  2 Units Subcutaneous TID WC   insulin glargine-yfgn  10 Units Subcutaneous Daily   sucralfate  1 g Oral TID WC & HS    sodium chloride Stopped (04/09/21 0116)   dextrose 5% lactated ringers 125 mL/hr at 04/09/21 0801   famotidine (PEPCID) IV  Stopped (04/09/21 0005)   heparin 750 Units/hr (04/09/21 0801)   insulin 1 Units/hr (04/09/21 0801)   lactated ringers     metronidazole Stopped (04/09/21 0755)    Physical Exam: Blood pressure 138/77, pulse (!) 104, temperature 98.7 F (37.1 C), resp. rate 14, height 5\' 3"  (1.6 m), weight 62 kg, SpO2 97 %, unknown if currently breastfeeding.   Affect appropriate Chronically ill black female  HEENT: normal Neck supple with no adenopathy JVP normal no bruits no thyromegaly Lungs clear with no wheezing and good diaphragmatic motion Heart:  S1/S2 no murmur, no rub, gallop or click PMI normal Abdomen: benighn, BS positve, no tenderness, no AAA no bruit.  No HSM or HJR Distal pulses intact with no bruits Pain to palpation over chest    Labs:   Lab Results  Component Value Date   WBC 21.7 (H) 04/09/2021   HGB 7.1 (L) 04/09/2021   HCT 21.9 (L) 04/09/2021   MCV 85.4 04/09/2021   PLT 369 04/09/2021    Recent Labs  Lab 04/05/21 0353 04/08/21 0728 04/09/21 0741  NA 135   < > 134*  K 3.6   < > 3.7  CL 111   < > 110  CO2 21*   < > 19*  BUN 16   < > 24*  CREATININE 1.36*   < > 2.54*  CALCIUM 7.5*   < > 7.8*  PROT 4.3*  --    --   BILITOT 0.5  --   --   ALKPHOS 94  --   --   ALT 12  --   --   AST 16  --   --   GLUCOSE 60*   < > 152*   < > = values in this interval not displayed.   Lab Results  Component Value Date   TROPONINI <0.03 11/25/2015    Lab Results  Component Value Date   CHOL 220 (H) 07/15/2020   CHOL 272 (H) 03/27/2016   Lab Results  Component Value Date   HDL 43.90 07/15/2020   HDL 47 (L) 03/27/2016   Lab Results  Component Value Date   LDLCALC 141 (H) 07/15/2020   LDLCALC 192 (H) 03/27/2016   Lab Results  Component Value Date   TRIG 174.0 (H) 07/15/2020   TRIG 163 (H) 03/27/2016   Lab Results  Component Value Date   CHOLHDL 5 07/15/2020   CHOLHDL 5.8 (H) 03/27/2016   No results found for: LDLDIRECT    Radiology: DG Chest Port 1 View  Result Date: 04/09/2021 CLINICAL DATA:  Hyperglycemia, chest pain EXAM: PORTABLE CHEST 1 VIEW COMPARISON:  Prior chest x-ray 04/01/2021 FINDINGS: Patient is rotated to the right. This distorts the cardiac and mediastinal contours. Persistent streaky airspace opacities in the left lung base. New developing linear opacities in right lung base. Additionally, there is new blunting of the right costophrenic angle consistent with a small pleural effusion. Cardiac and mediastinal contours remain grossly unchanged. No pneumothorax. No acute osseous abnormality. IMPRESSION: 1. Patient markedly rotated toward the right. 2. Probable bibasilar atelectasis. Superimposed infiltrate is difficult to exclude. 3. Suspect small bilateral pleural effusions larger on the right than the left. Electronically Signed   By: Jacqulynn Cadet M.D.   On: 04/09/2021 07:47   DG Chest Port 1 View  Result Date: 04/01/2021 CLINICAL DATA:  DKA. Recent COVID. Left lower lobe atelectasis versus infiltrate. EXAM: PORTABLE CHEST 1 VIEW COMPARISON:  03/31/2021 FINDINGS: Increased hazy densities in the left lower lung.  Slightly increased interstitial densities in both lungs. Heart size is  within normal limits and stable. Negative for a pneumothorax. IMPRESSION: 1. Hazy densities in left lower chest. Findings could represent pleural fluid or atelectasis. 2. Concern for mild interstitial pulmonary edema. Electronically Signed   By: Markus Daft M.D.   On: 04/01/2021 09:03   DG CHEST PORT 1 VIEW  Result Date: 03/31/2021 CLINICAL DATA:  Shortness of breath EXAM: PORTABLE CHEST 1 VIEW COMPARISON:  03/31/2021 FINDINGS: The heart size and mediastinal contours are within normal limits. Both lungs are clear. The visualized skeletal structures are unremarkable. IMPRESSION: No active disease. Electronically Signed   By: Jerilynn Mages.  Shick M.D.   On: 03/31/2021 15:49   DG Chest Portable 1 View  Result Date: 03/31/2021 CLINICAL DATA:  Diabetic ketoacidosis EXAM: PORTABLE CHEST 1 VIEW COMPARISON:  Prior chest x-ray 03/20/2021 FINDINGS: Low inspiratory volumes. Streaky opacities in the left lower lobe favored to reflect atelectasis. Cardiac and mediastinal contours are normal. No pleural effusion or pneumothorax. No acute osseous abnormality. IMPRESSION: Streaky airspace opacities in the left lower lobe favored to reflect atelectasis. Early bronchopneumonia is difficult to exclude entirely. Recommend dedicated PA and lateral chest x-ray when the patient is able. Electronically Signed   By: Jacqulynn Cadet M.D.   On: 03/31/2021 11:11   DG Chest Portable 1 View  Result Date: 03/20/2021 CLINICAL DATA:  Acidosis.  COVID positive. EXAM: PORTABLE CHEST 1 VIEW COMPARISON:  Radiograph 03/07/2021 FINDINGS: The cardiomediastinal contours are normal. The lungs are clear. Pulmonary vasculature is normal. No consolidation, pleural effusion, or pneumothorax. No acute osseous abnormalities are seen. IMPRESSION: No acute chest findings. Electronically Signed   By: Keith Rake M.D.   On: 03/20/2021 22:57   DG Abd Portable 1V  Result Date: 04/01/2021 CLINICAL DATA:  C difficile colitis EXAM: PORTABLE ABDOMEN - 1 VIEW  COMPARISON:  2013 FINDINGS: The bowel gas pattern is normal. No radio-opaque calculi or other significant radiographic abnormality are seen. IMPRESSION: Negative. Electronically Signed   By: Macy Mis M.D.   On: 04/01/2021 10:02   ECHOCARDIOGRAM COMPLETE  Result Date: 04/01/2021    ECHOCARDIOGRAM REPORT   Patient Name:   ARY RUDNICK [facility] Date of Exam: 04/01/2021 Medical Rec #:  [id number]                    Height:       63.0 in Accession #:    [id number]                   Weight:       142.2 lb Date of Birth:  1990/08/14                     BSA:          1.673 m Patient Age:    30 years                     BP:           140/59 mmHg Patient Gender: F                            HR:           104 bpm. Exam Location:  Inpatient Procedure: 2D Echo, Cardiac Doppler and Color Doppler Indications:    Cardiomyopathy-Unspecified I42.9  History:        Patient has prior history of Echocardiogram examinations,  most                 recent 12/08/2020. Risk Factors:Diabetes and Dyslipidemia.  Sonographer:    Bernadene Person RDCS Referring Phys: [place]  1. Left ventricular ejection fraction, by estimation, is 65 to 70%. The left ventricle has normal function. The left ventricle has no regional wall motion abnormalities. Left ventricular diastolic parameters were normal.  2. Right ventricular systolic function is normal. The right ventricular size is normal. There is mildly elevated pulmonary artery systolic pressure.  3. The mitral valve is normal in structure. Trivial mitral valve regurgitation.  4. The aortic valve is normal in structure. Aortic valve regurgitation is not visualized. No aortic stenosis is present. FINDINGS  Left Ventricle: Left ventricular ejection fraction, by estimation, is 65 to 70%. The left ventricle has normal function. The left ventricle has no regional wall motion abnormalities. The left ventricular internal cavity size was small. There is no left ventricular  hypertrophy. Left ventricular diastolic parameters were normal. Right Ventricle: The right ventricular size is normal. Right vetricular wall thickness was not well visualized. Right ventricular systolic function is normal. There is mildly elevated pulmonary artery systolic pressure. The tricuspid regurgitant velocity  is 2.94 m/s, and with an assumed right atrial pressure of 3 mmHg, the estimated right ventricular systolic pressure is 92.1 mmHg. Left Atrium: Left atrial size was normal in size. Right Atrium: Right atrial size was normal in size. Pericardium: There is no evidence of pericardial effusion. Mitral Valve: The mitral valve is normal in structure. Trivial mitral valve regurgitation. Tricuspid Valve: The tricuspid valve is normal in structure. Tricuspid valve regurgitation is mild. Aortic Valve: The aortic valve is normal in structure. Aortic valve regurgitation is not visualized. No aortic stenosis is present. Pulmonic Valve: The pulmonic valve was grossly normal. Pulmonic valve regurgitation is trivial. Aorta: The aortic root and ascending aorta are structurally normal, with no evidence of dilitation. IAS/Shunts: The interatrial septum was not well visualized.  LEFT VENTRICLE PLAX 2D LVIDd:         3.80 cm     Diastology LVIDs:         2.70 cm     LV e' medial:    8.43 cm/s LV PW:         0.80 cm     LV E/e' medial:  12.6 LV IVS:        0.70 cm     LV e' lateral:   10.30 cm/s LVOT diam:     1.80 cm     LV E/e' lateral: 10.3 LV SV:         42 LV SV Index:   25 LVOT Area:     2.54 cm  LV Volumes (MOD) LV vol d, MOD A2C: 67.6 ml LV vol d, MOD A4C: 77.5 ml LV vol s, MOD A2C: 29.6 ml LV vol s, MOD A4C: 32.4 ml LV SV MOD A2C:     38.0 ml LV SV MOD A4C:     77.5 ml LV SV MOD BP:      42.6 ml RIGHT VENTRICLE RV S prime:     13.60 cm/s TAPSE (M-mode): 2.4 cm LEFT ATRIUM             Index        RIGHT ATRIUM          Index LA diam:        1.80 cm 1.08 cm/m   RA Area:  9.32 cm LA Vol (A2C):   19.6 ml 11.72  ml/m  RA Volume:   17.50 ml 10.46 ml/m LA Vol (A4C):   21.6 ml 12.91 ml/m LA Biplane Vol: 21.0 ml 12.55 ml/m  AORTIC VALVE LVOT Vmax:   109.00 cm/s LVOT Vmean:  74.900 cm/s LVOT VTI:    0.166 m  AORTA Ao Root diam: 2.60 cm Ao Asc diam:  2.70 cm MITRAL VALVE                TRICUSPID VALVE MV Area (PHT): 5.62 cm     TR Peak grad:   34.6 mmHg MV Decel Time: 135 msec     TR Vmax:        294.00 cm/s MV E velocity: 106.00 cm/s MV A velocity: 104.00 cm/s  SHUNTS MV E/A ratio:  1.02         Systemic VTI:  0.17 m                             Systemic Diam: 1.80 cm Mertie Moores MD Electronically signed by Mertie Moores MD Signature Date/Time: 04/01/2021/4:21:52 PM    Final    [place] EKG SITE RITE  Result Date: 04/08/2021 If Site Rite image not attached, placement could not be confirmed due to current cardiac rhythm.   EKG: ST rate 103 nonspecific ST changes    ASSESSMENT AND PLAN:   Chest Pain:  non cardiac. ? Check smear r/o sickle cell dx recent TTE ok with no RWMAls and no effusion ECG not classic for pericarditis Updated TTE pending Can consider f/u cardiac MRI  for elevated troponin to r/o myocarditis pending results Does not need transfer to [facility] Getting dilaudid and morphine for pain Add motrin 800 mg tid and protect stomach with carafate and pepcid She is written for ASA 325 mg would stop heparin as this is not coronary in nature Given GI issues diarrhea would not add colchicine at this time  DKA:  recurrent primary issue inability to adequately Rx this issue Hydrate insulin per primary service Bicarb up to 17 Cdiff:  had vancomycin previously now on Flagyl  Anemia:  ? Etiology transfuse today Not iron deficient   Signed: Jenkins Rouge 04/09/2021, 8:53 AM

## 2021-04-09 NOTE — Progress Notes (Addendum)
PROGRESS NOTE    Ashley Freeman  ZOX:[id number] DOB: 26-Oct-1990 DOA: 04/08/2021 PCP: Verlee Rossetti, PA-C    Brief Narrative:  Ashley Freeman is a 30 year old female with past medical history significant for type 1 diabetes mellitus, CKD stage IIIa, GERD, essential hypertension, gastroparesis, medical noncompliance who presented to [facility] ED on 12/10 via EMS complaining of diffuse pain.  On EMS arrival, patient states her insulin pump had failed requiring her to take injections.  Patient complaining of diffuse body aches, nausea/vomiting, diarrhea over the last few days.  Did not try any medications for symptoms at home.  Given symptoms did not resolve, EMS was contacted and transported to the ED.  In the ED, temperature 97.6 F, HR 114, RR 21, BP 96/55, SPO2 100% on room air.  Sodium 126, potassium 5.9, chloride 98, CO2 less than 7, glucose 1191 and an anion gap that was on unable to be calculated.  VBG with pH 7.044.  WBC 20.3, hemoglobin 7.6, platelets 374, MCV 102.1.  Beta hydroxybutyrate acid greater than 8.00.  hCG negative.  COVID-19 PCR negative peer influenza A/B PCR negative.  High sensitive troponin 496.  PCCM was consulted, reviewed and felt that she was appropriate in the stepdown unit on the medicine service.  Cardiology consulted for elevated troponin.  Hospital service was consulted for further evaluation and management.   Assessment & Plan:   Active Problems:   Atypical chest pain   AKI (acute kidney injury) (HCC)   DKA (diabetic ketoacidosis) (HCC)   Right forearm necrotizing infection with myositis/necrosis concerning for compartment syndrome While receiving blood transfusion this morning, patient's IV infiltrated with patient experiencing severe pain to her right forearm.  Patient was having paresthesias to her fingers with decreased finger flexion/extension and significant pain on palpation of the anterior forearm.  Radial and ulnar pulses were  intact.  CT right forearm/humerus without contrast notable for diffuse enlargement/heterogenicity forearm musculature both flexor/extensor compartments with numerous foci of soft tissue gas, suggestive of diffuse myositis with infectious Pyo myositis or myonecrosis with circumferential soft tissue edema and ill-defined fluid throughout right upper arm/forearm/hand suggestive of cellulitis.  No abscess identified but noncontrasted study.  Discussed with orthopedics on-call, Dr. Yevette Edwards who recommended transfer to [facility] for hand surgery evaluation as there is no hand surgeon on-call in [place] currently. --Checking CK level --Blood cultures x2: Ordered --Start antibiotics with Zyvox, meropenem --Awaiting callback from [facility]  Diabetic ketoacidosis Hx type 1 diabetes mellitus, poorly controlled Patient representing to the ED following recent discharge with diffuse body aches, difficulty with her insulin pump at home and was found to have an elevated glucose greater than 1000 with elevated beta hydroxybutyrate acid.  Etiology likely secondary to noncompliance given her history and multiple hospitalizations for same.  Hemoglobin A1c 10.5 on 03/07/2021.  Initially started on insulin drip with resolution of DKA with normalization of anion gap and beta hydroxybutyrate acid. --Diabetic educator following --Transition insulin drip to Semglee 10 units subcutaneously daily --NovoLog 2u TIDAC if eating >50% meals --SSI for coverage --CBGs qAC/HS --Continue IVF with NS at 75 mL/h --Clear liquid diet, advance as tolerates  Chest pain Elevated troponin Patient presenting with diffuse body aches/pain.  Initial troponin elevated at 496.  EKG with sinus tachycardia, rate 113, QTc 491, no concerning dynamic changes.  Unclear etiology. --Cardiology following, appreciate assistance --hs Troponin 496>811>[id number]>1032 --TTE: LVEF 55-60%, no LV RWMA, small circumfrential pericardial  effusion, IVC normal --Ibuprofen 800 mg 3 times daily  with Carafate/Pepcid --Cardiology recommends stopping heparin drip as this is not ACS, consideration of possible myocarditis with consideration of follow-up with cardiac MRI. --Continue to monitor on telemetry  Symptomatic anemia Hemoglobin on admission 7.6.  Recently discharged was 7.7.  Overnight dropped to 6.7.  Unclear etiology.  Anemia panel with iron 45, TIBC 123, ferritin 340, vitamin B12 935, folate 6.5. --Hgb 7.7>>6.7; transfusing 2 unit PRBCs --Peripheral smear --CBC daily; goal hemoglobin > 8.0 given chest pain  Leukocytosis Recent C. difficile colitis infection Recently diagnosed with C. difficile colitis, discharged on oral vancomycin.  WBC count elevated 20.3 on admission. --Continue Flagyl 500 mg IV q12h until tolerating oral intake --CBC daily  Gastroparesis --Reglan 5 mg IV q8h scheduled --Zofran 4 mg IV every 8 hours as needed nausea/vomiting --Clear liquid diet, advance as tolerates  Acute renal failure on CKD stage IIIa Patient presenting with a creatinine of 2.78.  On recent discharge 04/05/2021 was 1.36.  Etiology likely secondary to dehydration from DKA as above. --Cr 2.78>>2.46 --Continue IVF hydration with NS at 75 mL/h --Avoid nephrotoxins, renal dose all medications. --BMP daily  DVT prophylaxis: SCDs   Code Status: Full Code Family Communication: No family present at bedside this morning.  Disposition Plan:  Level of care: Stepdown Status is: Inpatient  Remains inpatient appropriate because: Remains on insulin drip, on IV fluids, continues with nausea/vomiting, on heparin drip for NSTEMI    Consultants:  PCCM Cardiology  Procedures:  TTE  Antimicrobials:  Metronidazole 12/10>>   Subjective: Patient seen and examined at bedside, lying in bed.  Ill in appearance.  Continues to be very weak, fatigued, with associated nausea and vomiting.  Glucose now better controlled with closure of  anion gap and resolution of elevated beta hydroxybutyric acid.  Remains on insulin drip; and plan to transition to subcutaneous insulin today.  No other specific complaints at this time.  Denies headache, no current chest pain, no shortness of breath, no fever, no chills, no cough/congestion.  No other acute events overnight per nursing staff other than drop in hemoglobin and pending transfusion.  Objective: Vitals:   04/09/21 0900 04/09/21 1000 04/09/21 1103 04/09/21 1118  BP: 128/79 (!) 154/81 (!) 156/95 (!) 162/81  Pulse: (!) 104 100 (!) 101 (!) 110  Resp: 17 14 14 20   Temp:   98.2 F (36.8 C) 97.9 F (36.6 C)  TempSrc:   Oral   SpO2: 96% 96% 97% 97%  Weight:      Height:        Intake/Output Summary (Last 24 hours) at 04/09/2021 1156 Last data filed at 04/09/2021 0801 Gross per 24 hour  Intake 3445.31 ml  Output 1055 ml  Net 2390.31 ml   Filed Weights   04/08/21 0655  Weight: 62 kg    Examination:  General exam: Appears calm and comfortable, chronically ill in appearance, appears older than stated age Respiratory system: Clear to auscultation. Respiratory effort normal. Cardiovascular system: S1 & S2 heard, RRR. No JVD, murmurs, rubs, gallops or clicks. No pedal edema. Gastrointestinal system: Abdomen is nondistended, soft, mild generalized tenderness to palpation, no rebound/guarding/masses, bowel sounds present.   Central nervous system: Alert and oriented. No focal neurological deficits. Extremities: Moves all extremities independently Skin: No rashes, lesions or ulcers Psychiatry: Judgement and insight appear normal. Mood & affect appropriate.     Data Reviewed: I have personally reviewed following labs and imaging studies  CBC: Recent Labs  Lab 04/03/21 1119 04/04/21 0925 04/05/21 0353 04/08/21 0728 04/08/21 1957  04/09/21 0139 04/09/21 0534 04/09/21 0741  WBC 20.3* 15.5* 13.3* 20.3*  --   --  21.7*  --   NEUTROABS  --   --   --  13.1*  --   --   --    --   HGB 9.3* 9.0* 7.7* 7.6* 7.2* 6.7* 7.2* 7.1*  HCT 28.0* 26.9* 23.8* 28.8* 23.1* 21.4* 22.9* 21.9*  MCV 81.6 81.8 83.8 102.1*  --   --  85.4  --   PLT 310 275 272 371  --   --  369  --    Basic Metabolic Panel: Recent Labs  Lab 04/03/21 1119 04/04/21 0925 04/05/21 0353 04/08/21 0725 04/08/21 0728 04/08/21 1447 04/08/21 2156 04/09/21 0139 04/09/21 0534 04/09/21 0741  NA 134* 135 135  --    < > 131* 136 136 135 134*  K 3.4* 3.8 3.6  --    < > 4.0 3.2* 3.1* 3.6 3.7  CL 109 110 111  --    < > 105 109 110 110 110  CO2 20* 19* 21*  --    < > 8* 15* 17* 17* 19*  GLUCOSE 176* 135* 60*  --    < > 963* 488* 217* 130* 152*  BUN 20 19 16   --    < > 28* 26* 25* 25* 24*  CREATININE 1.58* 1.38* 1.36*  --    < > 2.78* 2.70* 2.71* 2.46* 2.54*  CALCIUM 7.7* 7.5* 7.5*  --    < > 8.2* 8.0* 7.8* 7.9* 7.8*  MG 1.8 1.6* 1.7 2.1  --   --   --   --   --   --   PHOS 1.9* 3.5 3.1  --   --   --   --   --   --   --    < > = values in this interval not displayed.   GFR: Estimated Creatinine Clearance: 27.3 mL/min (A) (by C-G formula based on SCr of 2.54 mg/dL (H)). Liver Function Tests: Recent Labs  Lab 04/03/21 0017 04/03/21 1119 04/04/21 0925 04/05/21 0353  AST 28 23 15 16   ALT 17 17 14 12   ALKPHOS 111 119 103 94  BILITOT 0.5 0.4 0.5 0.5  PROT 4.7* 4.9* 4.8* 4.3*  ALBUMIN 1.6* 1.7* 1.6* 1.6*   No results for input(s): LIPASE, AMYLASE in the last 168 hours. No results for input(s): AMMONIA in the last 168 hours. Coagulation Profile: Recent Labs  Lab 04/08/21 1957  INR 1.2   Cardiac Enzymes: No results for input(s): CKTOTAL, CKMB, CKMBINDEX, TROPONINI in the last 168 hours. BNP (last 3 results) No results for input(s): PROBNP in the last 8760 hours. HbA1C: No results for input(s): HGBA1C in the last 72 hours. CBG: Recent Labs  Lab 04/09/21 0706 04/09/21 0754 04/09/21 0901 04/09/21 0956 04/09/21 1110  GLUCAP 143* 133* 137* 121* 103*   Lipid Profile: No results for  input(s): CHOL, HDL, LDLCALC, TRIG, CHOLHDL, LDLDIRECT in the last 72 hours. Thyroid Function Tests: No results for input(s): TSH, T4TOTAL, FREET4, T3FREE, THYROIDAB in the last 72 hours. Anemia Panel: Recent Labs    04/09/21 0741  VITAMINB12 935*  FOLATE 6.5  FERRITIN 340*  TIBC 123*  IRON 45  RETICCTPCT 2.4   Sepsis Labs: No results for input(s): PROCALCITON, LATICACIDVEN in the last 168 hours.  Recent Results (from the past 240 hour(s))  Resp Panel by RT-PCR (Flu A&B, Covid) Nasopharyngeal Swab     Status: None   Collection Time:  03/31/21 10:45 AM   Specimen: Nasopharyngeal Swab; Nasopharyngeal(NP) swabs in vial transport medium  Result Value Ref Range Status   SARS Coronavirus 2 by RT PCR NEGATIVE NEGATIVE Final    Comment: (NOTE) SARS-CoV-2 target nucleic acids are NOT DETECTED.  The SARS-CoV-2 RNA is generally detectable in upper respiratory specimens during the acute phase of infection. The lowest concentration of SARS-CoV-2 viral copies this assay can detect is 138 copies/mL. A negative result does not preclude SARS-Cov-2 infection and should not be used as the sole basis for treatment or other patient management decisions. A negative result may occur with  improper specimen collection/handling, submission of specimen other than nasopharyngeal swab, presence of viral mutation(s) within the areas targeted by this assay, and inadequate number of viral copies(<138 copies/mL). A negative result must be combined with clinical observations, patient history, and epidemiological information. The expected result is Negative.  Fact Sheet for Patients:  BloggerCourse.com  Fact Sheet for Healthcare Providers:  SeriousBroker.it  This test is no t yet approved or cleared by the [place] FDA and  has been authorized for detection and/or diagnosis of SARS-CoV-2 by FDA under an Emergency Use Authorization (EUA). This EUA will  remain  in effect (meaning this test can be used) for the duration of the COVID-19 declaration under Section 564(b)(1) of the Act, 21 U.S.C.section 360bbb-3(b)(1), unless the authorization is terminated  or revoked sooner.       Influenza A by PCR NEGATIVE NEGATIVE Final   Influenza B by PCR NEGATIVE NEGATIVE Final    Comment: (NOTE) The Xpert Xpress SARS-CoV-2/FLU/RSV plus assay is intended as an aid in the diagnosis of influenza from Nasopharyngeal swab specimens and should not be used as a sole basis for treatment. Nasal washings and aspirates are unacceptable for Xpert Xpress SARS-CoV-2/FLU/RSV testing.  Fact Sheet for Patients: BloggerCourse.com  Fact Sheet for Healthcare Providers: SeriousBroker.it  This test is not yet approved or cleared by the [place] FDA and has been authorized for detection and/or diagnosis of SARS-CoV-2 by FDA under an Emergency Use Authorization (EUA). This EUA will remain in effect (meaning this test can be used) for the duration of the COVID-19 declaration under Section 564(b)(1) of the Act, 21 U.S.C. section 360bbb-3(b)(1), unless the authorization is terminated or revoked.  Performed at [facility], [address], [place], [place] [address]   Blood culture (routine x 2)     Status: Abnormal   Collection Time: 03/31/21 10:47 AM   Specimen: BLOOD  Result Value Ref Range Status   Specimen Description   Final    BLOOD RIGHT ARM Performed at [facility], [address], [place], [place] [address]    Special Requests   Final    BOTTLES DRAWN AEROBIC AND ANAEROBIC Blood Culture adequate volume Performed at [facility], [address], [place], [place] [address]    Culture  Setup Time (A)  Final    GRAM VARIABLE ROD AEROBIC BOTTLE ONLY CRITICAL RESULT CALLED TO, READ BACK BY AND VERIFIED WITH: PHARMD D.WOFFORD AT 0930 ON  04/01/2021 BY T.SAAD.    Culture (A)  Final    BACILLUS SPECIES Standardized susceptibility testing for this organism is not available. Performed at [facility], [address], [place], [place] [address]    Report Status 04/03/2021 FINAL  Final  Blood Culture ID Panel (Reflexed)     Status: None   Collection Time: 03/31/21 10:47 AM  Result Value Ref Range Status  Enterococcus faecalis NOT DETECTED NOT DETECTED Final   Enterococcus Faecium NOT DETECTED NOT DETECTED Final   Listeria monocytogenes NOT DETECTED NOT DETECTED Final   Staphylococcus species NOT DETECTED NOT DETECTED Final   Staphylococcus aureus (BCID) NOT DETECTED NOT DETECTED Final   Staphylococcus epidermidis NOT DETECTED NOT DETECTED Final   Staphylococcus lugdunensis NOT DETECTED NOT DETECTED Final   Streptococcus species NOT DETECTED NOT DETECTED Final   Streptococcus agalactiae NOT DETECTED NOT DETECTED Final   Streptococcus pneumoniae NOT DETECTED NOT DETECTED Final   Streptococcus pyogenes NOT DETECTED NOT DETECTED Final   A.calcoaceticus-baumannii NOT DETECTED NOT DETECTED Final   Bacteroides fragilis NOT DETECTED NOT DETECTED Final   Enterobacterales NOT DETECTED NOT DETECTED Final   Enterobacter cloacae complex NOT DETECTED NOT DETECTED Final   Escherichia coli NOT DETECTED NOT DETECTED Final   Klebsiella aerogenes NOT DETECTED NOT DETECTED Final   Klebsiella oxytoca NOT DETECTED NOT DETECTED Final   Klebsiella pneumoniae NOT DETECTED NOT DETECTED Final   Proteus species NOT DETECTED NOT DETECTED Final   Salmonella species NOT DETECTED NOT DETECTED Final   Serratia marcescens NOT DETECTED NOT DETECTED Final   Haemophilus influenzae NOT DETECTED NOT DETECTED Final   Neisseria meningitidis NOT DETECTED NOT DETECTED Final   Pseudomonas aeruginosa NOT DETECTED NOT DETECTED Final   Stenotrophomonas maltophilia NOT DETECTED NOT DETECTED Final   Candida albicans NOT DETECTED NOT DETECTED Final   Candida  auris NOT DETECTED NOT DETECTED Final   Candida glabrata NOT DETECTED NOT DETECTED Final   Candida krusei NOT DETECTED NOT DETECTED Final   Candida parapsilosis NOT DETECTED NOT DETECTED Final   Candida tropicalis NOT DETECTED NOT DETECTED Final   Cryptococcus neoformans/gattii NOT DETECTED NOT DETECTED Final    Comment: Performed at [facility], [address], [place], [place] [address]  Blood culture (routine x 2)     Status: None   Collection Time: 03/31/21 10:55 AM   Specimen: BLOOD  Result Value Ref Range Status   Specimen Description   Final    BLOOD LEFT ARM Performed at [facility], [address], [place], [place] [address]    Special Requests   Final    BOTTLES DRAWN AEROBIC AND ANAEROBIC Blood Culture results may not be optimal due to an inadequate volume of blood received in culture bottles Performed at [facility], [address], [place], [place] [address]    Culture   Final    NO GROWTH 5 DAYS Performed at [facility], [address], [place], [place] [address]    Report Status 04/05/2021 FINAL  Final  Urine Culture     Status: None   Collection Time: 03/31/21  1:07 PM   Specimen: Urine, Clean Catch  Result Value Ref Range Status   Specimen Description   Final    URINE, CLEAN CATCH Performed at [facility], [address] [place], [place] [address]    Special Requests   Final    NONE Performed at [facility], [address], [place], [place] [address]    Culture   Final    NO GROWTH Performed at [facility], [address], [place], [place] [address]    Report Status 04/02/2021 FINAL  Final  C Difficile Quick Screen w PCR reflex     Status: Abnormal   Collection Time: 03/31/21  5:08 PM   Specimen: STOOL  Result Value Ref Range Status   C  Diff antigen POSITIVE (A) NEGATIVE Final   C Diff toxin NEGATIVE NEGATIVE Final   C Diff interpretation Results are  indeterminate. See PCR results.  Final    Comment: Performed at [facility], [address] [place], [place] [address]  C. Diff by PCR, Reflexed     Status: Abnormal   Collection Time: 03/31/21  5:08 PM  Result Value Ref Range Status   Toxigenic C. Difficile by PCR POSITIVE (A) NEGATIVE Final    Comment: Positive for toxigenic C. difficile with little to no toxin production. Only treat if clinical presentation suggests symptomatic illness. Performed at [facility], [address] [place], [place] [address]   MRSA Next Gen by PCR, Nasal     Status: None   Collection Time: 04/01/21 11:10 AM   Specimen: Nasal Mucosa; Nasal Swab  Result Value Ref Range Status   MRSA by PCR Next Gen NOT DETECTED NOT DETECTED Final    Comment: (NOTE) The GeneXpert MRSA Assay (FDA approved for NASAL specimens only), is one component of a comprehensive MRSA colonization surveillance program. It is not intended to diagnose MRSA infection nor to guide or monitor treatment for MRSA infections. Test performance is not FDA approved in patients less than 94 years old. Performed at [facility], [address], [place], [place] [address]   Resp Panel by RT-PCR (Flu A&B, Covid) Nasopharyngeal Swab     Status: None   Collection Time: 04/08/21  7:20 AM   Specimen: Nasopharyngeal Swab; Nasopharyngeal(NP) swabs in vial transport medium  Result Value Ref Range Status   SARS Coronavirus 2 by RT PCR NEGATIVE NEGATIVE Final    Comment: (NOTE) SARS-CoV-2 target nucleic acids are NOT DETECTED.  The SARS-CoV-2 RNA is generally detectable in upper respiratory specimens during the acute phase of infection. The lowest concentration of SARS-CoV-2 viral copies this assay can detect is 138 copies/mL. A negative result does not preclude SARS-Cov-2 infection and should not be used as the sole basis for treatment or other patient management decisions. A negative result may occur with   improper specimen collection/handling, submission of specimen other than nasopharyngeal swab, presence of viral mutation(s) within the areas targeted by this assay, and inadequate number of viral copies(<138 copies/mL). A negative result must be combined with clinical observations, patient history, and epidemiological information. The expected result is Negative.  Fact Sheet for Patients:  BloggerCourse.com  Fact Sheet for Healthcare Providers:  SeriousBroker.it  This test is no t yet approved or cleared by the [place] FDA and  has been authorized for detection and/or diagnosis of SARS-CoV-2 by FDA under an Emergency Use Authorization (EUA). This EUA will remain  in effect (meaning this test can be used) for the duration of the COVID-19 declaration under Section 564(b)(1) of the Act, 21 U.S.C.section 360bbb-3(b)(1), unless the authorization is terminated  or revoked sooner.       Influenza A by PCR NEGATIVE NEGATIVE Final   Influenza B by PCR NEGATIVE NEGATIVE Final    Comment: (NOTE) The Xpert Xpress SARS-CoV-2/FLU/RSV plus assay is intended as an aid in the diagnosis of influenza from Nasopharyngeal swab specimens and should not be used as a sole basis for treatment. Nasal washings and aspirates are unacceptable for Xpert Xpress SARS-CoV-2/FLU/RSV testing.  Fact Sheet for Patients: BloggerCourse.com  Fact Sheet for Healthcare Providers: SeriousBroker.it  This test is not yet approved or cleared by the [place] FDA and has been authorized for detection and/or diagnosis of SARS-CoV-2  by FDA under an Emergency Use Authorization (EUA). This EUA will remain in effect (meaning this test can be used) for the duration of the COVID-19 declaration under Section 564(b)(1) of the Act, 21 U.S.C. section 360bbb-3(b)(1), unless the authorization is terminated  or revoked.  Performed at [facility], [address], [place], [place] [address]          Radiology Studies: DG Chest Port 1 View  Result Date: 04/09/2021 CLINICAL DATA:  Hyperglycemia, chest pain EXAM: PORTABLE CHEST 1 VIEW COMPARISON:  Prior chest x-ray 04/01/2021 FINDINGS: Patient is rotated to the right. This distorts the cardiac and mediastinal contours. Persistent streaky airspace opacities in the left lung base. New developing linear opacities in right lung base. Additionally, there is new blunting of the right costophrenic angle consistent with a small pleural effusion. Cardiac and mediastinal contours remain grossly unchanged. No pneumothorax. No acute osseous abnormality. IMPRESSION: 1. Patient markedly rotated toward the right. 2. Probable bibasilar atelectasis. Superimposed infiltrate is difficult to exclude. 3. Suspect small bilateral pleural effusions larger on the right than the left. Electronically Signed   By: Malachy Moan M.D.   On: 04/09/2021 07:47   [place] EKG SITE RITE  Result Date: 04/08/2021 If Site Rite image not attached, placement could not be confirmed due to current cardiac rhythm.       Scheduled Meds:  sodium chloride   Intravenous Once   aspirin  325 mg Oral Once   Chlorhexidine Gluconate Cloth  6 each Topical Daily   ibuprofen  800 mg Oral TID   insulin aspart  0-5 Units Subcutaneous QHS   insulin aspart  0-6 Units Subcutaneous TID WC   insulin aspart  2 Units Subcutaneous TID WC   insulin glargine-yfgn  10 Units Subcutaneous Daily   sucralfate  1 g Oral TID WC & HS   Continuous Infusions:  sodium chloride Stopped (04/09/21 0116)   dextrose 5% lactated ringers 125 mL/hr at 04/09/21 0925   famotidine (PEPCID) IV Stopped (04/09/21 0005)   insulin 1 Units/hr (04/09/21 0801)   lactated ringers     metronidazole Stopped (04/09/21 0755)     LOS: 1 day    Critical Care Time Upon my evaluation, this patient had a high  probability of imminent or life-threatening deterioration due to DKA requiring insulin drip, NSTEMI, which required my direct attention, intervention, and personal management.  I have personally provided 49 minutes of critical care time exclusive of my time spent on separately billable procedures.  Time includes review of laboratory data, radiology results, discussion with consultants, and monitoring for potential decompensation.       Alvira Philips [place], DO [facility] Available via Epic secure chat 7am-7pm After these hours, please refer to coverage provider listed on [email or web address] 04/09/2021, 11:56 AM

## 2021-04-09 NOTE — Discharge Summary (Signed)
0.90 cm LV E/e' medial:  11.1 LV IVS:        0.90 cm LV e' lateral:   10.80 cm/s                        LV E/e' lateral: 9.2  IVC IVC diam: 1.20 cm LEFT ATRIUM         Index LA diam:    3.00 cm 1.82 cm/m  MITRAL VALVE MV Area (PHT): 5.23 cm MV Decel Time: 145 msec MV E velocity: 99.80 cm/s MV A velocity: 94.70 cm/s MV E/A ratio:  1.05 Eleonore Chiquito MD Electronically signed by Eleonore Chiquito MD Signature Date/Time: 04/09/2021/1:08:51 PM    Final    ECHOCARDIOGRAM COMPLETE  Result Date: 04/01/2021    ECHOCARDIOGRAM REPORT   Patient Name:   Ashley Freeman [facility] Date of Exam: 04/01/2021 Medical Rec #:  [id number]                    Height:       63.0 in Accession #:    [id number]                    Weight:       142.2 lb Date of Birth:  [birth year]                     BSA:          1.673 m Patient Age:    30 years                     BP:           140/59 mmHg Patient Gender: F                            HR:           104 bpm. Exam Location:  Inpatient Procedure: 2D Echo, Cardiac Doppler and Color Doppler Indications:    Cardiomyopathy-Unspecified I42.9  History:        Patient has prior history of Echocardiogram examinations, most                 recent 12/08/2020. Risk Factors:Diabetes and Dyslipidemia.  Sonographer:    Bernadene Person RDCS Referring Phys: [place]  1. Left ventricular ejection fraction, by estimation, is 65 to 70%. The left ventricle has normal function. The left ventricle has no regional wall motion abnormalities. Left ventricular diastolic parameters were normal.  2. Right ventricular systolic function is normal. The right ventricular size is normal. There is mildly elevated pulmonary artery systolic pressure.  3. The mitral valve is normal in structure. Trivial mitral valve regurgitation.  4. The aortic valve is normal in structure. Aortic valve regurgitation is not visualized. No aortic stenosis is present. FINDINGS  Left Ventricle: Left ventricular ejection fraction, by estimation, is 65 to 70%. The left ventricle has normal function. The left ventricle has no regional wall motion abnormalities. The left ventricular internal cavity size was small. There is no left ventricular hypertrophy. Left ventricular diastolic parameters were normal. Right Ventricle: The right ventricular size is normal. Right vetricular wall thickness was not well visualized. Right ventricular systolic function is normal. There is mildly elevated pulmonary artery systolic pressure. The tricuspid regurgitant velocity  is 2.94 m/s, and with an assumed right atrial pressure of 3 mmHg, the estimated right ventricular systolic pressure is  0.90 cm LV E/e' medial:  11.1 LV IVS:        0.90 cm LV e' lateral:   10.80 cm/s                        LV E/e' lateral: 9.2  IVC IVC diam: 1.20 cm LEFT ATRIUM         Index LA diam:    3.00 cm 1.82 cm/m  MITRAL VALVE MV Area (PHT): 5.23 cm MV Decel Time: 145 msec MV E velocity: 99.80 cm/s MV A velocity: 94.70 cm/s MV E/A ratio:  1.05 Eleonore Chiquito MD Electronically signed by Eleonore Chiquito MD Signature Date/Time: 04/09/2021/1:08:51 PM    Final    ECHOCARDIOGRAM COMPLETE  Result Date: 04/01/2021    ECHOCARDIOGRAM REPORT   Patient Name:   Ashley Freeman [facility] Date of Exam: 04/01/2021 Medical Rec #:  [id number]                    Height:       63.0 in Accession #:    [id number]                    Weight:       142.2 lb Date of Birth:  [birth year]                     BSA:          1.673 m Patient Age:    30 years                     BP:           140/59 mmHg Patient Gender: F                            HR:           104 bpm. Exam Location:  Inpatient Procedure: 2D Echo, Cardiac Doppler and Color Doppler Indications:    Cardiomyopathy-Unspecified I42.9  History:        Patient has prior history of Echocardiogram examinations, most                 recent 12/08/2020. Risk Factors:Diabetes and Dyslipidemia.  Sonographer:    Bernadene Person RDCS Referring Phys: [place]  1. Left ventricular ejection fraction, by estimation, is 65 to 70%. The left ventricle has normal function. The left ventricle has no regional wall motion abnormalities. Left ventricular diastolic parameters were normal.  2. Right ventricular systolic function is normal. The right ventricular size is normal. There is mildly elevated pulmonary artery systolic pressure.  3. The mitral valve is normal in structure. Trivial mitral valve regurgitation.  4. The aortic valve is normal in structure. Aortic valve regurgitation is not visualized. No aortic stenosis is present. FINDINGS  Left Ventricle: Left ventricular ejection fraction, by estimation, is 65 to 70%. The left ventricle has normal function. The left ventricle has no regional wall motion abnormalities. The left ventricular internal cavity size was small. There is no left ventricular hypertrophy. Left ventricular diastolic parameters were normal. Right Ventricle: The right ventricular size is normal. Right vetricular wall thickness was not well visualized. Right ventricular systolic function is normal. There is mildly elevated pulmonary artery systolic pressure. The tricuspid regurgitant velocity  is 2.94 m/s, and with an assumed right atrial pressure of 3 mmHg, the estimated right ventricular systolic pressure is  0.90 cm LV E/e' medial:  11.1 LV IVS:        0.90 cm LV e' lateral:   10.80 cm/s                        LV E/e' lateral: 9.2  IVC IVC diam: 1.20 cm LEFT ATRIUM         Index LA diam:    3.00 cm 1.82 cm/m  MITRAL VALVE MV Area (PHT): 5.23 cm MV Decel Time: 145 msec MV E velocity: 99.80 cm/s MV A velocity: 94.70 cm/s MV E/A ratio:  1.05 Eleonore Chiquito MD Electronically signed by Eleonore Chiquito MD Signature Date/Time: 04/09/2021/1:08:51 PM    Final    ECHOCARDIOGRAM COMPLETE  Result Date: 04/01/2021    ECHOCARDIOGRAM REPORT   Patient Name:   Ashley Freeman [facility] Date of Exam: 04/01/2021 Medical Rec #:  [id number]                    Height:       63.0 in Accession #:    [id number]                    Weight:       142.2 lb Date of Birth:  [birth year]                     BSA:          1.673 m Patient Age:    30 years                     BP:           140/59 mmHg Patient Gender: F                            HR:           104 bpm. Exam Location:  Inpatient Procedure: 2D Echo, Cardiac Doppler and Color Doppler Indications:    Cardiomyopathy-Unspecified I42.9  History:        Patient has prior history of Echocardiogram examinations, most                 recent 12/08/2020. Risk Factors:Diabetes and Dyslipidemia.  Sonographer:    Bernadene Person RDCS Referring Phys: [place]  1. Left ventricular ejection fraction, by estimation, is 65 to 70%. The left ventricle has normal function. The left ventricle has no regional wall motion abnormalities. Left ventricular diastolic parameters were normal.  2. Right ventricular systolic function is normal. The right ventricular size is normal. There is mildly elevated pulmonary artery systolic pressure.  3. The mitral valve is normal in structure. Trivial mitral valve regurgitation.  4. The aortic valve is normal in structure. Aortic valve regurgitation is not visualized. No aortic stenosis is present. FINDINGS  Left Ventricle: Left ventricular ejection fraction, by estimation, is 65 to 70%. The left ventricle has normal function. The left ventricle has no regional wall motion abnormalities. The left ventricular internal cavity size was small. There is no left ventricular hypertrophy. Left ventricular diastolic parameters were normal. Right Ventricle: The right ventricular size is normal. Right vetricular wall thickness was not well visualized. Right ventricular systolic function is normal. There is mildly elevated pulmonary artery systolic pressure. The tricuspid regurgitant velocity  is 2.94 m/s, and with an assumed right atrial pressure of 3 mmHg, the estimated right ventricular systolic pressure is  Physician Discharge Summary  Ziare Orrick [place] YIR:[id number] DOB: April 10, 1991 DOA: 04/08/2021  PCP: [name], PA-C  Admit date: 04/08/2021 Discharge date: 04/09/2021  Admitted From: Home Disposition: Transfer to [facility], accepting physician Dr. Ala Bent  History of present illness:  Ashley Freeman is a 30 year old female with past medical history significant for type 1 diabetes mellitus, CKD stage IIIa, GERD, essential hypertension, gastroparesis, medical noncompliance who presented to [facility] ED on 12/10 via EMS complaining of diffuse pain.  On EMS arrival, patient states her insulin pump had failed requiring her to take injections.  Patient complaining of diffuse body aches, nausea/vomiting, diarrhea over the last few days.  Did not try any medications for symptoms at home.  Given symptoms did not resolve, EMS was contacted and transported to the ED.   In the ED, temperature 97.6 F, HR 114, RR 21, BP 96/55, SPO2 100% on room air.  Sodium 126, potassium 5.9, chloride 98, CO2 less than 7, glucose 1191 and an anion gap that was on unable to be calculated.  VBG with pH 7.044.  WBC 20.3, hemoglobin 7.6, platelets 374, MCV 102.1.  Beta hydroxybutyrate acid greater than 8.00.  hCG negative.  COVID-19 PCR negative peer influenza A/B PCR negative.  High sensitive troponin 496.  PCCM was consulted, reviewed and felt that she was appropriate in the stepdown unit on the medicine service.  Cardiology consulted for elevated troponin.  Hospital service was consulted for further evaluation and management.  Hospital course:  Right forearm necrotizing infection with myositis/necrosis concerning for compartment syndrome While receiving blood transfusion this morning, patient's IV infiltrated with patient experiencing severe pain to her right forearm.  Patient was having paresthesias to her fingers with decreased finger flexion/extension and significant pain  on palpation of the anterior forearm.  Radial and ulnar pulses were intact.  CT right forearm/humerus without contrast notable for diffuse enlargement/heterogenicity forearm musculature both flexor/extensor compartments with numerous foci of soft tissue gas, suggestive of diffuse myositis with infectious Pyo myositis or myonecrosis with circumferential soft tissue edema and ill-defined fluid throughout right upper arm/forearm/hand suggestive of cellulitis.  No abscess identified but noncontrasted study.  Discussed with orthopedics on-call, Dr. Lynann Bologna who recommended transfer to [facility] for hand surgery evaluation as there is no hand surgeon on-call in [place] currently.  [facility] for transfer, discussed case with Hand surgery Dr. Ala Bent who accepted patient in transfer.  Checking CK level, blood cultures x2 ordered.  Initiated antibiotics with Zyvox and meropenem.   Diabetic ketoacidosis Hx type 1 diabetes mellitus, poorly controlled Patient representing to the ED following recent discharge with diffuse body aches, difficulty with her insulin pump at home and was found to have an elevated glucose greater than 1000 with elevated beta hydroxybutyrate acid.  Etiology likely secondary to noncompliance given her history and multiple hospitalizations for same.  Hemoglobin A1c 10.5 on 03/07/2021.  Initially started on insulin drip with resolution of DKA with normalization of anion gap and beta hydroxybutyrate acid.  Insulin drip was titrated off and started on insulin glargine 10 units subcutaneously daily.  Continue to monitor glucose and adjust insulin as needed.  Chest pain Elevated troponin Patient presenting with diffuse body aches/pain.  Initial troponin elevated at 496. EKG with sinus tachycardia, rate 113, QTc 491, no concerning dynamic changes.  Unclear etiology.  Cardiology was consulted.  TTE with LVEF 55-60%, no LV RWMA, small circumfrential pericardial  effusion, IVC normal.  Patient was started on  Physician Discharge Summary  Ziare Orrick [place] YIR:[id number] DOB: April 10, 1991 DOA: 04/08/2021  PCP: [name], PA-C  Admit date: 04/08/2021 Discharge date: 04/09/2021  Admitted From: Home Disposition: Transfer to [facility], accepting physician Dr. Ala Bent  History of present illness:  Ashley Freeman is a 30 year old female with past medical history significant for type 1 diabetes mellitus, CKD stage IIIa, GERD, essential hypertension, gastroparesis, medical noncompliance who presented to [facility] ED on 12/10 via EMS complaining of diffuse pain.  On EMS arrival, patient states her insulin pump had failed requiring her to take injections.  Patient complaining of diffuse body aches, nausea/vomiting, diarrhea over the last few days.  Did not try any medications for symptoms at home.  Given symptoms did not resolve, EMS was contacted and transported to the ED.   In the ED, temperature 97.6 F, HR 114, RR 21, BP 96/55, SPO2 100% on room air.  Sodium 126, potassium 5.9, chloride 98, CO2 less than 7, glucose 1191 and an anion gap that was on unable to be calculated.  VBG with pH 7.044.  WBC 20.3, hemoglobin 7.6, platelets 374, MCV 102.1.  Beta hydroxybutyrate acid greater than 8.00.  hCG negative.  COVID-19 PCR negative peer influenza A/B PCR negative.  High sensitive troponin 496.  PCCM was consulted, reviewed and felt that she was appropriate in the stepdown unit on the medicine service.  Cardiology consulted for elevated troponin.  Hospital service was consulted for further evaluation and management.  Hospital course:  Right forearm necrotizing infection with myositis/necrosis concerning for compartment syndrome While receiving blood transfusion this morning, patient's IV infiltrated with patient experiencing severe pain to her right forearm.  Patient was having paresthesias to her fingers with decreased finger flexion/extension and significant pain  on palpation of the anterior forearm.  Radial and ulnar pulses were intact.  CT right forearm/humerus without contrast notable for diffuse enlargement/heterogenicity forearm musculature both flexor/extensor compartments with numerous foci of soft tissue gas, suggestive of diffuse myositis with infectious Pyo myositis or myonecrosis with circumferential soft tissue edema and ill-defined fluid throughout right upper arm/forearm/hand suggestive of cellulitis.  No abscess identified but noncontrasted study.  Discussed with orthopedics on-call, Dr. Lynann Bologna who recommended transfer to [facility] for hand surgery evaluation as there is no hand surgeon on-call in [place] currently.  [facility] for transfer, discussed case with Hand surgery Dr. Ala Bent who accepted patient in transfer.  Checking CK level, blood cultures x2 ordered.  Initiated antibiotics with Zyvox and meropenem.   Diabetic ketoacidosis Hx type 1 diabetes mellitus, poorly controlled Patient representing to the ED following recent discharge with diffuse body aches, difficulty with her insulin pump at home and was found to have an elevated glucose greater than 1000 with elevated beta hydroxybutyrate acid.  Etiology likely secondary to noncompliance given her history and multiple hospitalizations for same.  Hemoglobin A1c 10.5 on 03/07/2021.  Initially started on insulin drip with resolution of DKA with normalization of anion gap and beta hydroxybutyrate acid.  Insulin drip was titrated off and started on insulin glargine 10 units subcutaneously daily.  Continue to monitor glucose and adjust insulin as needed.  Chest pain Elevated troponin Patient presenting with diffuse body aches/pain.  Initial troponin elevated at 496. EKG with sinus tachycardia, rate 113, QTc 491, no concerning dynamic changes.  Unclear etiology.  Cardiology was consulted.  TTE with LVEF 55-60%, no LV RWMA, small circumfrential pericardial  effusion, IVC normal.  Patient was started on  0.90 cm LV E/e' medial:  11.1 LV IVS:        0.90 cm LV e' lateral:   10.80 cm/s                        LV E/e' lateral: 9.2  IVC IVC diam: 1.20 cm LEFT ATRIUM         Index LA diam:    3.00 cm 1.82 cm/m  MITRAL VALVE MV Area (PHT): 5.23 cm MV Decel Time: 145 msec MV E velocity: 99.80 cm/s MV A velocity: 94.70 cm/s MV E/A ratio:  1.05 Eleonore Chiquito MD Electronically signed by Eleonore Chiquito MD Signature Date/Time: 04/09/2021/1:08:51 PM    Final    ECHOCARDIOGRAM COMPLETE  Result Date: 04/01/2021    ECHOCARDIOGRAM REPORT   Patient Name:   Ashley Freeman [facility] Date of Exam: 04/01/2021 Medical Rec #:  [id number]                    Height:       63.0 in Accession #:    [id number]                    Weight:       142.2 lb Date of Birth:  [birth year]                     BSA:          1.673 m Patient Age:    30 years                     BP:           140/59 mmHg Patient Gender: F                            HR:           104 bpm. Exam Location:  Inpatient Procedure: 2D Echo, Cardiac Doppler and Color Doppler Indications:    Cardiomyopathy-Unspecified I42.9  History:        Patient has prior history of Echocardiogram examinations, most                 recent 12/08/2020. Risk Factors:Diabetes and Dyslipidemia.  Sonographer:    Bernadene Person RDCS Referring Phys: [place]  1. Left ventricular ejection fraction, by estimation, is 65 to 70%. The left ventricle has normal function. The left ventricle has no regional wall motion abnormalities. Left ventricular diastolic parameters were normal.  2. Right ventricular systolic function is normal. The right ventricular size is normal. There is mildly elevated pulmonary artery systolic pressure.  3. The mitral valve is normal in structure. Trivial mitral valve regurgitation.  4. The aortic valve is normal in structure. Aortic valve regurgitation is not visualized. No aortic stenosis is present. FINDINGS  Left Ventricle: Left ventricular ejection fraction, by estimation, is 65 to 70%. The left ventricle has normal function. The left ventricle has no regional wall motion abnormalities. The left ventricular internal cavity size was small. There is no left ventricular hypertrophy. Left ventricular diastolic parameters were normal. Right Ventricle: The right ventricular size is normal. Right vetricular wall thickness was not well visualized. Right ventricular systolic function is normal. There is mildly elevated pulmonary artery systolic pressure. The tricuspid regurgitant velocity  is 2.94 m/s, and with an assumed right atrial pressure of 3 mmHg, the estimated right ventricular systolic pressure is  0.90 cm LV E/e' medial:  11.1 LV IVS:        0.90 cm LV e' lateral:   10.80 cm/s                        LV E/e' lateral: 9.2  IVC IVC diam: 1.20 cm LEFT ATRIUM         Index LA diam:    3.00 cm 1.82 cm/m  MITRAL VALVE MV Area (PHT): 5.23 cm MV Decel Time: 145 msec MV E velocity: 99.80 cm/s MV A velocity: 94.70 cm/s MV E/A ratio:  1.05 Eleonore Chiquito MD Electronically signed by Eleonore Chiquito MD Signature Date/Time: 04/09/2021/1:08:51 PM    Final    ECHOCARDIOGRAM COMPLETE  Result Date: 04/01/2021    ECHOCARDIOGRAM REPORT   Patient Name:   Ashley Freeman [facility] Date of Exam: 04/01/2021 Medical Rec #:  [id number]                    Height:       63.0 in Accession #:    [id number]                    Weight:       142.2 lb Date of Birth:  [birth year]                     BSA:          1.673 m Patient Age:    30 years                     BP:           140/59 mmHg Patient Gender: F                            HR:           104 bpm. Exam Location:  Inpatient Procedure: 2D Echo, Cardiac Doppler and Color Doppler Indications:    Cardiomyopathy-Unspecified I42.9  History:        Patient has prior history of Echocardiogram examinations, most                 recent 12/08/2020. Risk Factors:Diabetes and Dyslipidemia.  Sonographer:    Bernadene Person RDCS Referring Phys: [place]  1. Left ventricular ejection fraction, by estimation, is 65 to 70%. The left ventricle has normal function. The left ventricle has no regional wall motion abnormalities. Left ventricular diastolic parameters were normal.  2. Right ventricular systolic function is normal. The right ventricular size is normal. There is mildly elevated pulmonary artery systolic pressure.  3. The mitral valve is normal in structure. Trivial mitral valve regurgitation.  4. The aortic valve is normal in structure. Aortic valve regurgitation is not visualized. No aortic stenosis is present. FINDINGS  Left Ventricle: Left ventricular ejection fraction, by estimation, is 65 to 70%. The left ventricle has normal function. The left ventricle has no regional wall motion abnormalities. The left ventricular internal cavity size was small. There is no left ventricular hypertrophy. Left ventricular diastolic parameters were normal. Right Ventricle: The right ventricular size is normal. Right vetricular wall thickness was not well visualized. Right ventricular systolic function is normal. There is mildly elevated pulmonary artery systolic pressure. The tricuspid regurgitant velocity  is 2.94 m/s, and with an assumed right atrial pressure of 3 mmHg, the estimated right ventricular systolic pressure is  0.90 cm LV E/e' medial:  11.1 LV IVS:        0.90 cm LV e' lateral:   10.80 cm/s                        LV E/e' lateral: 9.2  IVC IVC diam: 1.20 cm LEFT ATRIUM         Index LA diam:    3.00 cm 1.82 cm/m  MITRAL VALVE MV Area (PHT): 5.23 cm MV Decel Time: 145 msec MV E velocity: 99.80 cm/s MV A velocity: 94.70 cm/s MV E/A ratio:  1.05 Eleonore Chiquito MD Electronically signed by Eleonore Chiquito MD Signature Date/Time: 04/09/2021/1:08:51 PM    Final    ECHOCARDIOGRAM COMPLETE  Result Date: 04/01/2021    ECHOCARDIOGRAM REPORT   Patient Name:   Ashley Freeman [facility] Date of Exam: 04/01/2021 Medical Rec #:  [id number]                    Height:       63.0 in Accession #:    [id number]                    Weight:       142.2 lb Date of Birth:  [birth year]                     BSA:          1.673 m Patient Age:    30 years                     BP:           140/59 mmHg Patient Gender: F                            HR:           104 bpm. Exam Location:  Inpatient Procedure: 2D Echo, Cardiac Doppler and Color Doppler Indications:    Cardiomyopathy-Unspecified I42.9  History:        Patient has prior history of Echocardiogram examinations, most                 recent 12/08/2020. Risk Factors:Diabetes and Dyslipidemia.  Sonographer:    Bernadene Person RDCS Referring Phys: [place]  1. Left ventricular ejection fraction, by estimation, is 65 to 70%. The left ventricle has normal function. The left ventricle has no regional wall motion abnormalities. Left ventricular diastolic parameters were normal.  2. Right ventricular systolic function is normal. The right ventricular size is normal. There is mildly elevated pulmonary artery systolic pressure.  3. The mitral valve is normal in structure. Trivial mitral valve regurgitation.  4. The aortic valve is normal in structure. Aortic valve regurgitation is not visualized. No aortic stenosis is present. FINDINGS  Left Ventricle: Left ventricular ejection fraction, by estimation, is 65 to 70%. The left ventricle has normal function. The left ventricle has no regional wall motion abnormalities. The left ventricular internal cavity size was small. There is no left ventricular hypertrophy. Left ventricular diastolic parameters were normal. Right Ventricle: The right ventricular size is normal. Right vetricular wall thickness was not well visualized. Right ventricular systolic function is normal. There is mildly elevated pulmonary artery systolic pressure. The tricuspid regurgitant velocity  is 2.94 m/s, and with an assumed right atrial pressure of 3 mmHg, the estimated right ventricular systolic pressure is  0.90 cm LV E/e' medial:  11.1 LV IVS:        0.90 cm LV e' lateral:   10.80 cm/s                        LV E/e' lateral: 9.2  IVC IVC diam: 1.20 cm LEFT ATRIUM         Index LA diam:    3.00 cm 1.82 cm/m  MITRAL VALVE MV Area (PHT): 5.23 cm MV Decel Time: 145 msec MV E velocity: 99.80 cm/s MV A velocity: 94.70 cm/s MV E/A ratio:  1.05 Eleonore Chiquito MD Electronically signed by Eleonore Chiquito MD Signature Date/Time: 04/09/2021/1:08:51 PM    Final    ECHOCARDIOGRAM COMPLETE  Result Date: 04/01/2021    ECHOCARDIOGRAM REPORT   Patient Name:   Ashley Freeman [facility] Date of Exam: 04/01/2021 Medical Rec #:  [id number]                    Height:       63.0 in Accession #:    [id number]                    Weight:       142.2 lb Date of Birth:  [birth year]                     BSA:          1.673 m Patient Age:    30 years                     BP:           140/59 mmHg Patient Gender: F                            HR:           104 bpm. Exam Location:  Inpatient Procedure: 2D Echo, Cardiac Doppler and Color Doppler Indications:    Cardiomyopathy-Unspecified I42.9  History:        Patient has prior history of Echocardiogram examinations, most                 recent 12/08/2020. Risk Factors:Diabetes and Dyslipidemia.  Sonographer:    Bernadene Person RDCS Referring Phys: [place]  1. Left ventricular ejection fraction, by estimation, is 65 to 70%. The left ventricle has normal function. The left ventricle has no regional wall motion abnormalities. Left ventricular diastolic parameters were normal.  2. Right ventricular systolic function is normal. The right ventricular size is normal. There is mildly elevated pulmonary artery systolic pressure.  3. The mitral valve is normal in structure. Trivial mitral valve regurgitation.  4. The aortic valve is normal in structure. Aortic valve regurgitation is not visualized. No aortic stenosis is present. FINDINGS  Left Ventricle: Left ventricular ejection fraction, by estimation, is 65 to 70%. The left ventricle has normal function. The left ventricle has no regional wall motion abnormalities. The left ventricular internal cavity size was small. There is no left ventricular hypertrophy. Left ventricular diastolic parameters were normal. Right Ventricle: The right ventricular size is normal. Right vetricular wall thickness was not well visualized. Right ventricular systolic function is normal. There is mildly elevated pulmonary artery systolic pressure. The tricuspid regurgitant velocity  is 2.94 m/s, and with an assumed right atrial pressure of 3 mmHg, the estimated right ventricular systolic pressure is  0.90 cm LV E/e' medial:  11.1 LV IVS:        0.90 cm LV e' lateral:   10.80 cm/s                        LV E/e' lateral: 9.2  IVC IVC diam: 1.20 cm LEFT ATRIUM         Index LA diam:    3.00 cm 1.82 cm/m  MITRAL VALVE MV Area (PHT): 5.23 cm MV Decel Time: 145 msec MV E velocity: 99.80 cm/s MV A velocity: 94.70 cm/s MV E/A ratio:  1.05 Eleonore Chiquito MD Electronically signed by Eleonore Chiquito MD Signature Date/Time: 04/09/2021/1:08:51 PM    Final    ECHOCARDIOGRAM COMPLETE  Result Date: 04/01/2021    ECHOCARDIOGRAM REPORT   Patient Name:   Ashley Freeman [facility] Date of Exam: 04/01/2021 Medical Rec #:  [id number]                    Height:       63.0 in Accession #:    [id number]                    Weight:       142.2 lb Date of Birth:  [birth year]                     BSA:          1.673 m Patient Age:    30 years                     BP:           140/59 mmHg Patient Gender: F                            HR:           104 bpm. Exam Location:  Inpatient Procedure: 2D Echo, Cardiac Doppler and Color Doppler Indications:    Cardiomyopathy-Unspecified I42.9  History:        Patient has prior history of Echocardiogram examinations, most                 recent 12/08/2020. Risk Factors:Diabetes and Dyslipidemia.  Sonographer:    Bernadene Person RDCS Referring Phys: [place]  1. Left ventricular ejection fraction, by estimation, is 65 to 70%. The left ventricle has normal function. The left ventricle has no regional wall motion abnormalities. Left ventricular diastolic parameters were normal.  2. Right ventricular systolic function is normal. The right ventricular size is normal. There is mildly elevated pulmonary artery systolic pressure.  3. The mitral valve is normal in structure. Trivial mitral valve regurgitation.  4. The aortic valve is normal in structure. Aortic valve regurgitation is not visualized. No aortic stenosis is present. FINDINGS  Left Ventricle: Left ventricular ejection fraction, by estimation, is 65 to 70%. The left ventricle has normal function. The left ventricle has no regional wall motion abnormalities. The left ventricular internal cavity size was small. There is no left ventricular hypertrophy. Left ventricular diastolic parameters were normal. Right Ventricle: The right ventricular size is normal. Right vetricular wall thickness was not well visualized. Right ventricular systolic function is normal. There is mildly elevated pulmonary artery systolic pressure. The tricuspid regurgitant velocity  is 2.94 m/s, and with an assumed right atrial pressure of 3 mmHg, the estimated right ventricular systolic pressure is  0.90 cm LV E/e' medial:  11.1 LV IVS:        0.90 cm LV e' lateral:   10.80 cm/s                        LV E/e' lateral: 9.2  IVC IVC diam: 1.20 cm LEFT ATRIUM         Index LA diam:    3.00 cm 1.82 cm/m  MITRAL VALVE MV Area (PHT): 5.23 cm MV Decel Time: 145 msec MV E velocity: 99.80 cm/s MV A velocity: 94.70 cm/s MV E/A ratio:  1.05 Eleonore Chiquito MD Electronically signed by Eleonore Chiquito MD Signature Date/Time: 04/09/2021/1:08:51 PM    Final    ECHOCARDIOGRAM COMPLETE  Result Date: 04/01/2021    ECHOCARDIOGRAM REPORT   Patient Name:   Ashley Freeman [facility] Date of Exam: 04/01/2021 Medical Rec #:  [id number]                    Height:       63.0 in Accession #:    [id number]                    Weight:       142.2 lb Date of Birth:  [birth year]                     BSA:          1.673 m Patient Age:    30 years                     BP:           140/59 mmHg Patient Gender: F                            HR:           104 bpm. Exam Location:  Inpatient Procedure: 2D Echo, Cardiac Doppler and Color Doppler Indications:    Cardiomyopathy-Unspecified I42.9  History:        Patient has prior history of Echocardiogram examinations, most                 recent 12/08/2020. Risk Factors:Diabetes and Dyslipidemia.  Sonographer:    Bernadene Person RDCS Referring Phys: [place]  1. Left ventricular ejection fraction, by estimation, is 65 to 70%. The left ventricle has normal function. The left ventricle has no regional wall motion abnormalities. Left ventricular diastolic parameters were normal.  2. Right ventricular systolic function is normal. The right ventricular size is normal. There is mildly elevated pulmonary artery systolic pressure.  3. The mitral valve is normal in structure. Trivial mitral valve regurgitation.  4. The aortic valve is normal in structure. Aortic valve regurgitation is not visualized. No aortic stenosis is present. FINDINGS  Left Ventricle: Left ventricular ejection fraction, by estimation, is 65 to 70%. The left ventricle has normal function. The left ventricle has no regional wall motion abnormalities. The left ventricular internal cavity size was small. There is no left ventricular hypertrophy. Left ventricular diastolic parameters were normal. Right Ventricle: The right ventricular size is normal. Right vetricular wall thickness was not well visualized. Right ventricular systolic function is normal. There is mildly elevated pulmonary artery systolic pressure. The tricuspid regurgitant velocity  is 2.94 m/s, and with an assumed right atrial pressure of 3 mmHg, the estimated right ventricular systolic pressure is  Physician Discharge Summary  Ziare Orrick [place] YIR:[id number] DOB: April 10, 1991 DOA: 04/08/2021  PCP: [name], PA-C  Admit date: 04/08/2021 Discharge date: 04/09/2021  Admitted From: Home Disposition: Transfer to [facility], accepting physician Dr. Ala Bent  History of present illness:  Ashley Freeman is a 30 year old female with past medical history significant for type 1 diabetes mellitus, CKD stage IIIa, GERD, essential hypertension, gastroparesis, medical noncompliance who presented to [facility] ED on 12/10 via EMS complaining of diffuse pain.  On EMS arrival, patient states her insulin pump had failed requiring her to take injections.  Patient complaining of diffuse body aches, nausea/vomiting, diarrhea over the last few days.  Did not try any medications for symptoms at home.  Given symptoms did not resolve, EMS was contacted and transported to the ED.   In the ED, temperature 97.6 F, HR 114, RR 21, BP 96/55, SPO2 100% on room air.  Sodium 126, potassium 5.9, chloride 98, CO2 less than 7, glucose 1191 and an anion gap that was on unable to be calculated.  VBG with pH 7.044.  WBC 20.3, hemoglobin 7.6, platelets 374, MCV 102.1.  Beta hydroxybutyrate acid greater than 8.00.  hCG negative.  COVID-19 PCR negative peer influenza A/B PCR negative.  High sensitive troponin 496.  PCCM was consulted, reviewed and felt that she was appropriate in the stepdown unit on the medicine service.  Cardiology consulted for elevated troponin.  Hospital service was consulted for further evaluation and management.  Hospital course:  Right forearm necrotizing infection with myositis/necrosis concerning for compartment syndrome While receiving blood transfusion this morning, patient's IV infiltrated with patient experiencing severe pain to her right forearm.  Patient was having paresthesias to her fingers with decreased finger flexion/extension and significant pain  on palpation of the anterior forearm.  Radial and ulnar pulses were intact.  CT right forearm/humerus without contrast notable for diffuse enlargement/heterogenicity forearm musculature both flexor/extensor compartments with numerous foci of soft tissue gas, suggestive of diffuse myositis with infectious Pyo myositis or myonecrosis with circumferential soft tissue edema and ill-defined fluid throughout right upper arm/forearm/hand suggestive of cellulitis.  No abscess identified but noncontrasted study.  Discussed with orthopedics on-call, Dr. Lynann Bologna who recommended transfer to [facility] for hand surgery evaluation as there is no hand surgeon on-call in [place] currently.  [facility] for transfer, discussed case with Hand surgery Dr. Ala Bent who accepted patient in transfer.  Checking CK level, blood cultures x2 ordered.  Initiated antibiotics with Zyvox and meropenem.   Diabetic ketoacidosis Hx type 1 diabetes mellitus, poorly controlled Patient representing to the ED following recent discharge with diffuse body aches, difficulty with her insulin pump at home and was found to have an elevated glucose greater than 1000 with elevated beta hydroxybutyrate acid.  Etiology likely secondary to noncompliance given her history and multiple hospitalizations for same.  Hemoglobin A1c 10.5 on 03/07/2021.  Initially started on insulin drip with resolution of DKA with normalization of anion gap and beta hydroxybutyrate acid.  Insulin drip was titrated off and started on insulin glargine 10 units subcutaneously daily.  Continue to monitor glucose and adjust insulin as needed.  Chest pain Elevated troponin Patient presenting with diffuse body aches/pain.  Initial troponin elevated at 496. EKG with sinus tachycardia, rate 113, QTc 491, no concerning dynamic changes.  Unclear etiology.  Cardiology was consulted.  TTE with LVEF 55-60%, no LV RWMA, small circumfrential pericardial  effusion, IVC normal.  Patient was started on  Physician Discharge Summary  Ziare Orrick [place] YIR:[id number] DOB: April 10, 1991 DOA: 04/08/2021  PCP: [name], PA-C  Admit date: 04/08/2021 Discharge date: 04/09/2021  Admitted From: Home Disposition: Transfer to [facility], accepting physician Dr. Ala Bent  History of present illness:  Ashley Freeman is a 30 year old female with past medical history significant for type 1 diabetes mellitus, CKD stage IIIa, GERD, essential hypertension, gastroparesis, medical noncompliance who presented to [facility] ED on 12/10 via EMS complaining of diffuse pain.  On EMS arrival, patient states her insulin pump had failed requiring her to take injections.  Patient complaining of diffuse body aches, nausea/vomiting, diarrhea over the last few days.  Did not try any medications for symptoms at home.  Given symptoms did not resolve, EMS was contacted and transported to the ED.   In the ED, temperature 97.6 F, HR 114, RR 21, BP 96/55, SPO2 100% on room air.  Sodium 126, potassium 5.9, chloride 98, CO2 less than 7, glucose 1191 and an anion gap that was on unable to be calculated.  VBG with pH 7.044.  WBC 20.3, hemoglobin 7.6, platelets 374, MCV 102.1.  Beta hydroxybutyrate acid greater than 8.00.  hCG negative.  COVID-19 PCR negative peer influenza A/B PCR negative.  High sensitive troponin 496.  PCCM was consulted, reviewed and felt that she was appropriate in the stepdown unit on the medicine service.  Cardiology consulted for elevated troponin.  Hospital service was consulted for further evaluation and management.  Hospital course:  Right forearm necrotizing infection with myositis/necrosis concerning for compartment syndrome While receiving blood transfusion this morning, patient's IV infiltrated with patient experiencing severe pain to her right forearm.  Patient was having paresthesias to her fingers with decreased finger flexion/extension and significant pain  on palpation of the anterior forearm.  Radial and ulnar pulses were intact.  CT right forearm/humerus without contrast notable for diffuse enlargement/heterogenicity forearm musculature both flexor/extensor compartments with numerous foci of soft tissue gas, suggestive of diffuse myositis with infectious Pyo myositis or myonecrosis with circumferential soft tissue edema and ill-defined fluid throughout right upper arm/forearm/hand suggestive of cellulitis.  No abscess identified but noncontrasted study.  Discussed with orthopedics on-call, Dr. Lynann Bologna who recommended transfer to [facility] for hand surgery evaluation as there is no hand surgeon on-call in [place] currently.  [facility] for transfer, discussed case with Hand surgery Dr. Ala Bent who accepted patient in transfer.  Checking CK level, blood cultures x2 ordered.  Initiated antibiotics with Zyvox and meropenem.   Diabetic ketoacidosis Hx type 1 diabetes mellitus, poorly controlled Patient representing to the ED following recent discharge with diffuse body aches, difficulty with her insulin pump at home and was found to have an elevated glucose greater than 1000 with elevated beta hydroxybutyrate acid.  Etiology likely secondary to noncompliance given her history and multiple hospitalizations for same.  Hemoglobin A1c 10.5 on 03/07/2021.  Initially started on insulin drip with resolution of DKA with normalization of anion gap and beta hydroxybutyrate acid.  Insulin drip was titrated off and started on insulin glargine 10 units subcutaneously daily.  Continue to monitor glucose and adjust insulin as needed.  Chest pain Elevated troponin Patient presenting with diffuse body aches/pain.  Initial troponin elevated at 496. EKG with sinus tachycardia, rate 113, QTc 491, no concerning dynamic changes.  Unclear etiology.  Cardiology was consulted.  TTE with LVEF 55-60%, no LV RWMA, small circumfrential pericardial  effusion, IVC normal.  Patient was started on  Physician Discharge Summary  Ziare Orrick [place] YIR:[id number] DOB: April 10, 1991 DOA: 04/08/2021  PCP: [name], PA-C  Admit date: 04/08/2021 Discharge date: 04/09/2021  Admitted From: Home Disposition: Transfer to [facility], accepting physician Dr. Ala Bent  History of present illness:  Ashley Freeman is a 30 year old female with past medical history significant for type 1 diabetes mellitus, CKD stage IIIa, GERD, essential hypertension, gastroparesis, medical noncompliance who presented to [facility] ED on 12/10 via EMS complaining of diffuse pain.  On EMS arrival, patient states her insulin pump had failed requiring her to take injections.  Patient complaining of diffuse body aches, nausea/vomiting, diarrhea over the last few days.  Did not try any medications for symptoms at home.  Given symptoms did not resolve, EMS was contacted and transported to the ED.   In the ED, temperature 97.6 F, HR 114, RR 21, BP 96/55, SPO2 100% on room air.  Sodium 126, potassium 5.9, chloride 98, CO2 less than 7, glucose 1191 and an anion gap that was on unable to be calculated.  VBG with pH 7.044.  WBC 20.3, hemoglobin 7.6, platelets 374, MCV 102.1.  Beta hydroxybutyrate acid greater than 8.00.  hCG negative.  COVID-19 PCR negative peer influenza A/B PCR negative.  High sensitive troponin 496.  PCCM was consulted, reviewed and felt that she was appropriate in the stepdown unit on the medicine service.  Cardiology consulted for elevated troponin.  Hospital service was consulted for further evaluation and management.  Hospital course:  Right forearm necrotizing infection with myositis/necrosis concerning for compartment syndrome While receiving blood transfusion this morning, patient's IV infiltrated with patient experiencing severe pain to her right forearm.  Patient was having paresthesias to her fingers with decreased finger flexion/extension and significant pain  on palpation of the anterior forearm.  Radial and ulnar pulses were intact.  CT right forearm/humerus without contrast notable for diffuse enlargement/heterogenicity forearm musculature both flexor/extensor compartments with numerous foci of soft tissue gas, suggestive of diffuse myositis with infectious Pyo myositis or myonecrosis with circumferential soft tissue edema and ill-defined fluid throughout right upper arm/forearm/hand suggestive of cellulitis.  No abscess identified but noncontrasted study.  Discussed with orthopedics on-call, Dr. Lynann Bologna who recommended transfer to [facility] for hand surgery evaluation as there is no hand surgeon on-call in [place] currently.  [facility] for transfer, discussed case with Hand surgery Dr. Ala Bent who accepted patient in transfer.  Checking CK level, blood cultures x2 ordered.  Initiated antibiotics with Zyvox and meropenem.   Diabetic ketoacidosis Hx type 1 diabetes mellitus, poorly controlled Patient representing to the ED following recent discharge with diffuse body aches, difficulty with her insulin pump at home and was found to have an elevated glucose greater than 1000 with elevated beta hydroxybutyrate acid.  Etiology likely secondary to noncompliance given her history and multiple hospitalizations for same.  Hemoglobin A1c 10.5 on 03/07/2021.  Initially started on insulin drip with resolution of DKA with normalization of anion gap and beta hydroxybutyrate acid.  Insulin drip was titrated off and started on insulin glargine 10 units subcutaneously daily.  Continue to monitor glucose and adjust insulin as needed.  Chest pain Elevated troponin Patient presenting with diffuse body aches/pain.  Initial troponin elevated at 496. EKG with sinus tachycardia, rate 113, QTc 491, no concerning dynamic changes.  Unclear etiology.  Cardiology was consulted.  TTE with LVEF 55-60%, no LV RWMA, small circumfrential pericardial  effusion, IVC normal.  Patient was started on  Physician Discharge Summary  Ziare Orrick [place] YIR:[id number] DOB: April 10, 1991 DOA: 04/08/2021  PCP: [name], PA-C  Admit date: 04/08/2021 Discharge date: 04/09/2021  Admitted From: Home Disposition: Transfer to [facility], accepting physician Dr. Ala Bent  History of present illness:  Ashley Freeman is a 30 year old female with past medical history significant for type 1 diabetes mellitus, CKD stage IIIa, GERD, essential hypertension, gastroparesis, medical noncompliance who presented to [facility] ED on 12/10 via EMS complaining of diffuse pain.  On EMS arrival, patient states her insulin pump had failed requiring her to take injections.  Patient complaining of diffuse body aches, nausea/vomiting, diarrhea over the last few days.  Did not try any medications for symptoms at home.  Given symptoms did not resolve, EMS was contacted and transported to the ED.   In the ED, temperature 97.6 F, HR 114, RR 21, BP 96/55, SPO2 100% on room air.  Sodium 126, potassium 5.9, chloride 98, CO2 less than 7, glucose 1191 and an anion gap that was on unable to be calculated.  VBG with pH 7.044.  WBC 20.3, hemoglobin 7.6, platelets 374, MCV 102.1.  Beta hydroxybutyrate acid greater than 8.00.  hCG negative.  COVID-19 PCR negative peer influenza A/B PCR negative.  High sensitive troponin 496.  PCCM was consulted, reviewed and felt that she was appropriate in the stepdown unit on the medicine service.  Cardiology consulted for elevated troponin.  Hospital service was consulted for further evaluation and management.  Hospital course:  Right forearm necrotizing infection with myositis/necrosis concerning for compartment syndrome While receiving blood transfusion this morning, patient's IV infiltrated with patient experiencing severe pain to her right forearm.  Patient was having paresthesias to her fingers with decreased finger flexion/extension and significant pain  on palpation of the anterior forearm.  Radial and ulnar pulses were intact.  CT right forearm/humerus without contrast notable for diffuse enlargement/heterogenicity forearm musculature both flexor/extensor compartments with numerous foci of soft tissue gas, suggestive of diffuse myositis with infectious Pyo myositis or myonecrosis with circumferential soft tissue edema and ill-defined fluid throughout right upper arm/forearm/hand suggestive of cellulitis.  No abscess identified but noncontrasted study.  Discussed with orthopedics on-call, Dr. Lynann Bologna who recommended transfer to [facility] for hand surgery evaluation as there is no hand surgeon on-call in [place] currently.  [facility] for transfer, discussed case with Hand surgery Dr. Ala Bent who accepted patient in transfer.  Checking CK level, blood cultures x2 ordered.  Initiated antibiotics with Zyvox and meropenem.   Diabetic ketoacidosis Hx type 1 diabetes mellitus, poorly controlled Patient representing to the ED following recent discharge with diffuse body aches, difficulty with her insulin pump at home and was found to have an elevated glucose greater than 1000 with elevated beta hydroxybutyrate acid.  Etiology likely secondary to noncompliance given her history and multiple hospitalizations for same.  Hemoglobin A1c 10.5 on 03/07/2021.  Initially started on insulin drip with resolution of DKA with normalization of anion gap and beta hydroxybutyrate acid.  Insulin drip was titrated off and started on insulin glargine 10 units subcutaneously daily.  Continue to monitor glucose and adjust insulin as needed.  Chest pain Elevated troponin Patient presenting with diffuse body aches/pain.  Initial troponin elevated at 496. EKG with sinus tachycardia, rate 113, QTc 491, no concerning dynamic changes.  Unclear etiology.  Cardiology was consulted.  TTE with LVEF 55-60%, no LV RWMA, small circumfrential pericardial  effusion, IVC normal.  Patient was started on

## 2021-04-09 NOTE — Progress Notes (Signed)
Report called to [facility] ED charge nurse for immediate transfer.

## 2021-04-09 NOTE — Progress Notes (Signed)
Spoke with Mikki Santee, mother of patient. Obtained telephone consent to place the PICC line via 2 RN verification with Shirlean Kelly, RN VAST.

## 2021-04-09 NOTE — Progress Notes (Signed)
Patient Transferred off unit and in route to [facility] @ 1920

## 2021-04-10 ENCOUNTER — Inpatient Hospital Stay (HOSPITAL_COMMUNITY)
Admission: AD | Admit: 2021-04-10 | Discharge: 2021-04-14 | Disposition: A | Discharge status: Home / Self Care | Payer: Medicaid Other | Source: Other Acute Inpatient Hospital | Attending: Internal Medicine | Admitting: Internal Medicine

## 2021-04-10 DIAGNOSIS — I248 Other forms of acute ischemic heart disease: Secondary | ICD-10-CM

## 2021-04-10 DIAGNOSIS — R079 Chest pain, unspecified: Secondary | ICD-10-CM

## 2021-04-10 DIAGNOSIS — I2489 Other forms of acute ischemic heart disease: Secondary | ICD-10-CM

## 2021-04-10 DIAGNOSIS — E1110 Type 2 diabetes mellitus with ketoacidosis without coma: Secondary | ICD-10-CM | POA: Diagnosis present

## 2021-04-10 DIAGNOSIS — M549 Dorsalgia, unspecified: Principal | ICD-10-CM

## 2021-04-10 LAB — COMPREHENSIVE METABOLIC PANEL
ALT: 19 U/L (ref 0–44)
AST: 35 U/L (ref 15–41)
Albumin: 1.6 g/dL — ABNORMAL LOW (ref 3.5–5.0)
Alkaline Phosphatase: 98 U/L (ref 38–126)
Anion gap: 4 — ABNORMAL LOW (ref 5–15)
BUN: 22 mg/dL — ABNORMAL HIGH (ref 6–20)
CO2: 19 mmol/L — ABNORMAL LOW (ref 22–32)
Calcium: 7.4 mg/dL — ABNORMAL LOW (ref 8.9–10.3)
Chloride: 113 mmol/L — ABNORMAL HIGH (ref 98–111)
Creatinine, Ser: 2.11 mg/dL — ABNORMAL HIGH (ref 0.44–1.00)
GFR, Estimated: 32 mL/min — ABNORMAL LOW (ref >60)
Glucose, Bld: 152 mg/dL — ABNORMAL HIGH (ref 70–99)
Potassium: 3.7 mmol/L (ref 3.5–5.1)
Sodium: 136 mmol/L (ref 135–145)
Total Bilirubin: 0.5 mg/dL (ref 0.3–1.2)
Total Protein: 4.6 g/dL — ABNORMAL LOW (ref 6.5–8.1)

## 2021-04-10 LAB — TYPE AND SCREEN
ABO/RH(D): A POS
Antibody Screen: NEGATIVE
Unit division: 0
Unit division: 0

## 2021-04-10 LAB — BPAM RBC
Blood Product Expiration Date: 202212262359
Blood Product Expiration Date: 202212262359
ISSUE DATE / TIME: 202212111058
ISSUE DATE / TIME: 202212111649
Unit Type and Rh: 6200
Unit Type and Rh: 6200

## 2021-04-10 LAB — GLUCOSE, CAPILLARY
Glucose-Capillary: 165 mg/dL — ABNORMAL HIGH (ref 70–99)
Glucose-Capillary: 146 mg/dL — ABNORMAL HIGH (ref 70–99)

## 2021-04-10 LAB — CBC WITH DIFFERENTIAL/PLATELET
Abs Immature Granulocytes: 0.13 10*3/uL — ABNORMAL HIGH (ref 0.00–0.07)
Basophils Absolute: 0 10*3/uL (ref 0.0–0.1)
Basophils Relative: 0 %
Eosinophils Absolute: 0.1 10*3/uL (ref 0.0–0.5)
Eosinophils Relative: 1 %
HCT: 27.3 % — ABNORMAL LOW (ref 36.0–46.0)
Hemoglobin: 8.8 g/dL — ABNORMAL LOW (ref 12.0–15.0)
Immature Granulocytes: 1 %
Lymphocytes Relative: 20 %
Lymphs Abs: 2 10*3/uL (ref 0.7–4.0)
MCH: 27.6 pg (ref 26.0–34.0)
MCHC: 32.2 g/dL (ref 30.0–36.0)
MCV: 85.6 fL (ref 80.0–100.0)
Monocytes Absolute: 0.9 10*3/uL (ref 0.1–1.0)
Monocytes Relative: 8 %
Neutro Abs: 7 10*3/uL (ref 1.7–7.7)
Neutrophils Relative %: 70 %
Platelets: 295 10*3/uL (ref 150–400)
RBC: 3.19 MIL/uL — ABNORMAL LOW (ref 3.87–5.11)
RDW: 16.3 % — ABNORMAL HIGH (ref 11.5–15.5)
WBC: 10.1 10*3/uL (ref 4.0–10.5)
nRBC: 0 % (ref 0.0–0.2)

## 2021-04-10 MED ORDER — SUCRALFATE 1 G PO TABS
1.0000 g | ORAL_TABLET | Freq: Three times a day (TID) | ORAL | Status: DC
  Administered 2021-04-10 – 2021-04-14 (×14): 1 g via ORAL
  Filled 2021-04-10 (×14): qty 1

## 2021-04-10 MED ORDER — INSULIN ASPART 100 UNIT/ML IJ SOLN: 0.0000 [IU] | Freq: Every day | INTRAMUSCULAR | Status: DC

## 2021-04-10 MED ORDER — INSULIN ASPART 100 UNIT/ML IJ SOLN
0.0000 [IU] | Freq: Three times a day (TID) | INTRAMUSCULAR | Status: DC
  Administered 2021-04-10 – 2021-04-11 (×3): 3 [IU] via SUBCUTANEOUS
  Administered 2021-04-12: 12:00:00 8 [IU] via SUBCUTANEOUS
  Administered 2021-04-12 (×2): 2 [IU] via SUBCUTANEOUS
  Administered 2021-04-13: 5 [IU] via SUBCUTANEOUS
  Administered 2021-04-13: 8 [IU] via SUBCUTANEOUS
  Administered 2021-04-14: 2 [IU] via SUBCUTANEOUS

## 2021-04-10 MED ORDER — METOCLOPRAMIDE HCL 5 MG PO TABS: 5.0000 mg | ORAL_TABLET | Freq: Four times a day (QID) | ORAL | Status: DC | PRN

## 2021-04-10 MED ORDER — SODIUM CHLORIDE 0.9% FLUSH
10.0000 mL | INTRAVENOUS | Status: DC | PRN
  Administered 2021-04-11: 10 mL

## 2021-04-10 MED ORDER — LINEZOLID 600 MG/300ML IV SOLN: 600.0000 mg | Freq: Two times a day (BID) | INTRAVENOUS | Status: DC

## 2021-04-10 MED ORDER — SODIUM CHLORIDE 0.9 % IV SOLN: 1.0000 g | Freq: Two times a day (BID) | INTRAVENOUS | Status: DC

## 2021-04-10 MED ORDER — SODIUM CHLORIDE 0.9 % IV SOLN: INTRAVENOUS | Status: DC

## 2021-04-10 MED ORDER — IBUPROFEN 800 MG PO TABS: 800.0000 mg | ORAL_TABLET | Freq: Three times a day (TID) | ORAL | 0 refills | Status: DC

## 2021-04-10 MED ORDER — VANCOMYCIN HCL 125 MG PO CAPS
125.0000 mg | ORAL_CAPSULE | Freq: Four times a day (QID) | ORAL | Status: DC
  Administered 2021-04-10 – 2021-04-14 (×14): 125 mg via ORAL
  Filled 2021-04-10 (×18): qty 1

## 2021-04-10 MED ORDER — LAMOTRIGINE 25 MG PO TABS
150.0000 mg | ORAL_TABLET | Freq: Every day | ORAL | Status: DC
  Administered 2021-04-10 – 2021-04-14 (×5): 150 mg via ORAL
  Filled 2021-04-10 (×5): qty 2

## 2021-04-10 MED ORDER — METRONIDAZOLE 500 MG/100ML IV SOLN: 500.0000 mg | Freq: Three times a day (TID) | INTRAVENOUS | Status: DC

## 2021-04-10 MED ORDER — INSULIN ASPART 100 UNIT/ML IJ SOLN: 0.0000 [IU] | Freq: Three times a day (TID) | INTRAMUSCULAR | 11 refills | Status: DC

## 2021-04-10 MED ORDER — MORPHINE SULFATE (PF) 4 MG/ML IV SOLN: 4.0000 mg | INTRAVENOUS | 0 refills | Status: DC | PRN

## 2021-04-10 MED ORDER — INSULIN GLARGINE-YFGN 100 UNIT/ML ~~LOC~~ SOLN: 10.0000 [IU] | Freq: Every day | SUBCUTANEOUS | 11 refills | Status: DC

## 2021-04-10 MED ORDER — FAMOTIDINE 20 MG PO TABS
20.0000 mg | ORAL_TABLET | Freq: Two times a day (BID) | ORAL | Status: DC
  Administered 2021-04-10 – 2021-04-14 (×8): 20 mg via ORAL
  Filled 2021-04-10 (×8): qty 1

## 2021-04-10 MED ORDER — ONDANSETRON HCL 4 MG/2ML IJ SOLN: 4.0000 mg | Freq: Four times a day (QID) | INTRAMUSCULAR | Status: DC | PRN

## 2021-04-10 MED ORDER — VANCOMYCIN 50 MG/ML ORAL SOLUTION: 125.0000 mg | Freq: Four times a day (QID) | ORAL | Status: DC

## 2021-04-10 MED ORDER — INSULIN GLARGINE-YFGN 100 UNIT/ML ~~LOC~~ SOLN
10.0000 [IU] | Freq: Every day | SUBCUTANEOUS | Status: DC
  Administered 2021-04-10 – 2021-04-14 (×5): 10 [IU] via SUBCUTANEOUS
  Filled 2021-04-10 (×5): qty 0.1

## 2021-04-10 MED ORDER — INSULIN ASPART 100 UNIT/ML IJ SOLN: 2.0000 [IU] | Freq: Three times a day (TID) | INTRAMUSCULAR | 11 refills | Status: DC

## 2021-04-10 MED ORDER — ONDANSETRON HCL 4 MG/2ML IJ SOLN
4.0000 mg | Freq: Three times a day (TID) | INTRAMUSCULAR | 0 refills | Status: DC | PRN
Start: 2021-04-10 — End: 2021-04-14

## 2021-04-10 MED ORDER — INSULIN ASPART 100 UNIT/ML IJ SOLN: 0.0000 [IU] | Freq: Every day | INTRAMUSCULAR | 11 refills | Status: DC

## 2021-04-10 MED ORDER — IBUPROFEN 800 MG PO TABS
800.0000 mg | ORAL_TABLET | Freq: Three times a day (TID) | ORAL | Status: DC
  Administered 2021-04-10 – 2021-04-11 (×3): 800 mg via ORAL
  Filled 2021-04-10 (×3): qty 1

## 2021-04-10 MED ORDER — ACETAMINOPHEN 650 MG RE SUPP: 650.0000 mg | Freq: Four times a day (QID) | RECTAL | Status: DC | PRN

## 2021-04-10 MED ORDER — FAMOTIDINE IN NACL 20-0.9 MG/50ML-% IV SOLN
20.0000 mg | INTRAVENOUS | Status: DC
Start: 2021-04-10 — End: 2021-04-11

## 2021-04-10 MED ORDER — AMLODIPINE BESYLATE 10 MG PO TABS
10.0000 mg | ORAL_TABLET | Freq: Every day | ORAL | Status: DC
  Administered 2021-04-11 – 2021-04-14 (×4): 10 mg via ORAL
  Filled 2021-04-10 (×4): qty 1

## 2021-04-10 MED ORDER — SODIUM CHLORIDE 0.9% FLUSH
10.0000 mL | Freq: Two times a day (BID) | INTRAVENOUS | Status: DC
  Administered 2021-04-13: 10 mL

## 2021-04-10 MED ORDER — CHLORHEXIDINE GLUCONATE CLOTH 2 % EX PADS
6.0000 | MEDICATED_PAD | Freq: Every day | CUTANEOUS | Status: DC
  Administered 2021-04-10 – 2021-04-14 (×5): 6 via TOPICAL

## 2021-04-10 MED ORDER — ONDANSETRON HCL 4 MG PO TABS
4.0000 mg | ORAL_TABLET | Freq: Four times a day (QID) | ORAL | Status: DC | PRN
  Administered 2021-04-13 – 2021-04-14 (×2): 4 mg via ORAL
  Filled 2021-04-10 (×2): qty 1

## 2021-04-10 MED ORDER — SODIUM CHLORIDE 0.9 % IV SOLN
1.0000 g | Freq: Two times a day (BID) | INTRAVENOUS | Status: DC
  Administered 2021-04-10 – 2021-04-11 (×2): 1 g via INTRAVENOUS
  Filled 2021-04-10 (×2): qty 1

## 2021-04-10 MED ORDER — ACETAMINOPHEN 325 MG PO TABS
650.0000 mg | ORAL_TABLET | Freq: Four times a day (QID) | ORAL | Status: DC | PRN
  Administered 2021-04-11 – 2021-04-13 (×2): 650 mg via ORAL
  Filled 2021-04-10 (×3): qty 2

## 2021-04-10 MED ORDER — LINEZOLID 600 MG PO TABS
600.0000 mg | ORAL_TABLET | Freq: Two times a day (BID) | ORAL | Status: DC
  Administered 2021-04-10 – 2021-04-14 (×8): 600 mg via ORAL
  Filled 2021-04-10 (×9): qty 1

## 2021-04-10 MED ORDER — ASPIRIN 325 MG PO TABS: 325.0000 mg | ORAL_TABLET | Freq: Once | ORAL | 0 refills | Status: DC

## 2021-04-10 MED ORDER — METOCLOPRAMIDE HCL 5 MG/ML IJ SOLN
5.0000 mg | Freq: Three times a day (TID) | INTRAMUSCULAR | 0 refills | Status: DC
Start: 2021-04-10 — End: 2021-04-14

## 2021-04-10 NOTE — Progress Notes (Signed)
Pharmacy Antibiotic Note  Ashley Freeman is a 30 y.o. female admitted on 04/10/2021 with cellulitis.  Pharmacy has been consulted for meropenem/zyvox dosing. Continue PO vanc for recent Cdiff infection  Plan: Meropenem 1g IV q12 per current renal function Would recommend ID consult     Temp (24hrs), Avg:97.2 F (36.2 C), Min:96.7 F (35.9 C), Max:97.9 F (36.6 C)  Recent Labs  Lab 04/04/21 0925 04/05/21 0353 04/08/21 0728 04/08/21 1130 04/08/21 1447 04/08/21 2156 04/09/21 0139 04/09/21 0534 04/09/21 0741  WBC 15.5* 13.3* 20.3*  --   --   --   --  21.7*  --   CREATININE 1.38* 1.36* 2.78*   < > 2.78* 2.70* 2.71* 2.46* 2.54*   < > = values in this interval not displayed.    Estimated Creatinine Clearance: 27.3 mL/min (A) (by C-G formula based on SCr of 2.54 mg/dL (H)).    Allergies  Allergen Reactions   Cephalexin Anaphylaxis   Penicillins Hives and Rash    Has patient had a PCN reaction causing immediate rash, facial/tongue/throat swelling, SOB or lightheadedness with hypotension: Yes Has patient had a PCN reaction causing severe rash involving mucus membranes or skin necrosis: No Has patient had a PCN reaction that required hospitalization: Yes Has patient had a PCN reaction occurring within the last 10 years: No Spoke with pt - childhood hives told by mom, tried no pcns since, doesn't remember reaction herself    Benadryl [Diphenhydramine] Itching   Doxycycline Itching     Thank you for allowing pharmacy to be a part of this patient's care.  Kara Mead 04/10/2021 3:56 PM

## 2021-04-10 NOTE — H&P (Signed)
History and Physical    Ashley Freeman QJF:[id number] DOB: Aug 13, 1990 DOA: 04/10/2021  PCP: [name], PA-C  Patient coming from: [facility]  Chief Complaint: chest pain, hyperglycemia  HPI: Ashley Freeman is a 30 y.o. female with medical history significant of DMt1, CKD3a, gastroparesis, HTN, medical non-compliance. She was complaining of N/V/D and diffuse pain prior to admission. She was admitted to [facility] for DKA and chest pain. During her stay, she developed a rigth forearm necrotizing infection w/ myositis. She was transferred to [facility] for evaluation w/ the hand surgery team. After their evaluation, they did not see concern for compartment syndrome or necrotizing injury. They recommended elevation of arm and warm compresses. She was returned to [facility] for care.    Review of Systems: Review of systems is otherwise negative for all not mentioned in HPI.   PMHx Past Medical History:  Diagnosis Date   Abscess, gluteal, right 08/24/2013   AKI (acute kidney injury) ([place]) 07/26/2014   Anemia 02/19/2012   Bartholin's gland abscess 09/19/2013   BV (bacterial vaginosis) 11/24/2015   Diabetes mellitus type I ([place]) 2001   Diagnosed at age 44 ; Type I   Diarrhea 05/30/2016   DKA (diabetic ketoacidoses) 08/19/2013   Also in 2018   Gonorrhea 08/2011   Treated in 09/2011   History of trichomoniasis 05/31/2016   Hyperlipidemia 03/28/2016   Sepsis ([place]) 09/19/2013    PSHx Past Surgical History:  Procedure Laterality Date   CESAREAN SECTION N/A 10/05/2019   Procedure: CESAREAN SECTION;  Surgeon: Aletha Halim, MD;  Location: [facility] LD ORS;  Service: Obstetrics;  Laterality: N/A;   INCISION AND DRAINAGE ABSCESS Left 09/28/2019   Procedure: INCISION AND DRAINAGE VULVAR ABCESS;  Surgeon: [name], MD;  Location: [place];  Service: Gynecology;  Laterality: Left;   INCISION AND DRAINAGE PERIRECTAL ABSCESS Right 08/18/2013   Procedure: IRRIGATION AND DEBRIDEMENT GLUTEAL ABSCESS;   Surgeon: Ralene Ok, MD;  Location: [place];  Service: General;  Laterality: Right;   INCISION AND DRAINAGE PERIRECTAL ABSCESS Right 09/19/2013   Procedure: IRRIGATION AND DEBRIDEMENT RIGHT GLUTEAL AND LABIAL ABSCESSES;  Surgeon: Ralene Ok, MD;  Location: [place];  Service: General;  Laterality: Right;   INCISION AND DRAINAGE PERIRECTAL ABSCESS Right 09/24/2013   Procedure: IRRIGATION AND DEBRIDEMENT PERIRECTAL ABSCESS;  Surgeon: Gwenyth Ober, MD;  Location: [place];  Service: General;  Laterality: Right;    SocHx  reports that she has never smoked. She has never used smokeless tobacco. She reports that she does not currently use alcohol. She reports that she does not use drugs.  Allergies  Allergen Reactions   Cephalexin Anaphylaxis   Penicillins Hives and Rash    Has patient had a PCN reaction causing immediate rash, facial/tongue/throat swelling, SOB or lightheadedness with hypotension: Yes Has patient had a PCN reaction causing severe rash involving mucus membranes or skin necrosis: No Has patient had a PCN reaction that required hospitalization: Yes Has patient had a PCN reaction occurring within the last 10 years: No Spoke with pt - childhood hives told by mom, tried no pcns since, doesn't remember reaction herself    Benadryl [Diphenhydramine] Itching   Doxycycline Itching    FamHx Family History  Problem Relation Age of Onset   Asthma Mother    Carpal tunnel syndrome Mother    Gout Father    Diabetes Paternal Grandmother    Anesthesia problems Neg Hx     Prior to Admission medications   Medication Sig Start Date  End Date Taking? Authorizing Provider  albuterol (VENTOLIN HFA) 108 (90 Base) MCG/ACT inhaler Inhale 2 puffs into the lungs every 4 (four) hours as needed for wheezing or shortness of breath. 08/09/20   [provider]  amLODipine (NORVASC) 10 MG tablet Take 1 tablet (10 mg total) by mouth daily. 03/24/21 04/23/21  Richarda Osmond, MD  aspirin 325  MG tablet Take 1 tablet (325 mg total) by mouth once for 1 dose. 04/10/21 04/10/21  [place] Eric J, DO  blood glucose meter kit and supplies KIT Dispense based on patient and insurance preference. Use up to four times daily as directed. (FOR ICD-9 250.00, 250.01). 05/25/17   Isla Pence, MD  Blood Pressure Monitoring (BLOOD PRESSURE KIT) DEVI 1 Device by Does not apply route as needed. 05/06/19   Anyanwu, Sallyanne Havers, MD  Continuous Blood Gluc Sensor (DEXCOM G6 SENSOR) MISC Inject 1 Device into the skin as directed. 12/16/20   Shamleffer, Melanie Crazier, MD  Continuous Blood Gluc Transmit (DEXCOM G6 TRANSMITTER) MISC Inject 1 Device into the skin as directed. Use to check blood sugar daily 12/16/20   Shamleffer, Melanie Crazier, MD  famotidine (PEPCID) 20 MG tablet Take 1 tablet (20 mg total) by mouth 2 (two) times daily. 04/05/21 05/05/21  Alma Friendly, MD  famotidine (PEPCID) 20-0.9 MG/50ML-% Inject 50 mLs (20 mg total) into the vein daily. 04/10/21   [place] Donnamarie Poag, DO  ibuprofen (ADVIL) 800 MG tablet Take 1 tablet (800 mg total) by mouth 3 (three) times daily. 04/10/21   [place] Eric J, DO  insulin aspart (NOVOLOG) 100 UNIT/ML FlexPen 0-9 Units, Subcutaneous, 3 times daily with meals, CBG < 70: Implement Hypoglycemia measures CBG 70 - 120: 0 units CBG 121 - 150: 1 unit CBG 151 - 200: 2 units CBG 201 - 250: 3 units CBG 251 - 300: 5 units CBG 301 - 350: 7 units CBG 351 - 400: 9 units CBG > 400: call MD 03/29/21   Shamleffer, Melanie Crazier, MD  insulin aspart (NOVOLOG) 100 UNIT/ML injection Inject 0-6 Units into the skin 3 (three) times daily with meals. 04/10/21   [place] Eric J, DO  insulin aspart (NOVOLOG) 100 UNIT/ML injection Inject 0-5 Units into the skin at bedtime. 04/10/21   [place] Eric J, DO  insulin aspart (NOVOLOG) 100 UNIT/ML injection Inject 2 Units into the skin 3 (three) times daily with meals. 04/10/21   [place] Donnamarie Poag, DO  Insulin Disposable Pump (OMNIPOD 5 G6 INTRO, GEN 5,)  KIT 1 Device by Does not apply route every 3 (three) days. Patient taking differently: 1 Device by Does not apply route every 3 (three) days. 100 units for 72 hours 12/16/20   Shamleffer, Melanie Crazier, MD  insulin glargine-yfgn (SEMGLEE) 100 UNIT/ML injection Inject 0.1 mLs (10 Units total) into the skin daily. 04/10/21   [place] Donnamarie Poag, DO  Insulin Pen Needle 32G X 4 MM MISC Use as directed 10/07/20   Ghimire, Henreitta Leber, MD  ipratropium (ATROVENT HFA) 17 MCG/ACT inhaler Inhale 1 puff into the lungs every 6 (six) hours. 03/24/21   Richarda Osmond, MD  lamoTRIgine (LAMICTAL) 150 MG tablet Take 150 mg by mouth daily. 03/29/21   [provider]  linezolid (ZYVOX) 600 MG/300ML IVPB Inject 300 mLs (600 mg total) into the vein every 12 (twelve) hours. 04/10/21   [place] Eric J, DO  meropenem 1 g in sodium chloride 0.9 % 100 mL Inject 1 g into the vein every 12 (twelve) hours. 04/10/21  [place] Donnamarie Poag, DO  metoCLOPramide (REGLAN) 5 MG/ML injection Inject 1 mL (5 mg total) into the vein every 8 (eight) hours. 04/10/21   [place] Eric J, DO  metroNIDAZOLE (FLAGYL) 500 MG/100ML Inject 100 mLs (500 mg total) into the vein every 8 (eight) hours. 04/10/21   [place] Donnamarie Poag, DO  morphine 4 MG/ML injection Inject 1 mL (4 mg total) into the vein every 2 (two) hours as needed for severe pain. 04/10/21   [place] Donnamarie Poag, DO  nystatin (MYCOSTATIN) 100000 UNIT/ML suspension Take 5 mLs (500,000 Units total) by mouth 4 (four) times daily. 04/05/21   Alma Friendly, MD  ondansetron (ZOFRAN) 4 MG/2ML SOLN injection Inject 2 mLs (4 mg total) into the vein every 8 (eight) hours as needed for nausea or vomiting. 04/10/21   [place] Donnamarie Poag, DO  sucralfate (CARAFATE) 1 g tablet Take 1 tablet (1 g total) by mouth 4 (four) times daily -  with meals and at bedtime. 04/05/21 05/05/21  Alma Friendly, MD  vancomycin (VANCOCIN) 125 MG capsule Take 1 capsule (125 mg total) by mouth 4 (four) times daily for 5  days. Patient taking differently: Take 125 mg by mouth 4 (four) times daily. Start date : 04/05/21 04/06/21 04/11/21  Alma Friendly, MD  Ferrous Sulfate (IRON) 325 (65 Fe) MG TABS Take 1 tablet (325 mg total) by mouth every other day. Patient not taking: Reported on 07/25/2020 06/17/19 07/25/20  Chauncey Mann, MD  hydrochlorothiazide (HYDRODIURIL) 25 MG tablet Take 1 tablet (25 mg total) by mouth daily. Patient not taking: No sig reported 10/08/19 07/25/20  Constant, Peggy, MD  promethazine (PHENERGAN) 25 MG tablet Take 1 tablet (25 mg total) by mouth every 8 (eight) hours as needed for nausea or vomiting (if zofran fails). Patient not taking: Reported on 10/06/2020 08/25/20 10/06/20  Varney Biles, MD    Physical Exam: There were no vitals filed for this visit.  General: 30 y.o. female resting in bed in NAD Eyes: PERRL, normal sclera ENMT: Nares patent w/o discharge, orophaynx clear, dentition normal, ears w/o discharge/lesions/ulcers Neck: Supple, trachea midline Cardiovascular: RRR, +S1, S2, no m/g/r, equal pulses throughout Respiratory: CTABL, no w/r/r, normal WOB GI: BS+, NDNT, no masses noted, no organomegaly noted MSK: No e/c/c, RUE in sling; ext/flex at wrist w/o pain, neurovascular bundle intact Neuro: A&O x 3, no focal deficits Psyc: Appropriate interaction and affect, calm/cooperative  Labs on Admission: I have personally reviewed following labs and imaging studies  CBC: Recent Labs  Lab 04/04/21 0925 04/05/21 0353 04/08/21 0728 04/08/21 1957 04/09/21 0139 04/09/21 0534 04/09/21 0741  WBC 15.5* 13.3* 20.3*  --   --  21.7*  --   NEUTROABS  --   --  13.1*  --   --   --   --   HGB 9.0* 7.7* 7.6* 7.2* 6.7* 7.2* 7.1*  HCT 26.9* 23.8* 28.8* 23.1* 21.4* 22.9* 21.9*  MCV 81.8 83.8 102.1*  --   --  85.4  --   PLT 275 272 371  --   --  369  --    Basic Metabolic Panel: Recent Labs  Lab 04/04/21 0925 04/05/21 0353 04/08/21 0725 04/08/21 0728 04/08/21 1447  04/08/21 2156 04/09/21 0139 04/09/21 0534 04/09/21 0741  NA 135 135  --    < > 131* 136 136 135 134*  K 3.8 3.6  --    < > 4.0 3.2* 3.1* 3.6 3.7  CL 110 111  --    < >  105 109 110 110 110  CO2 19* 21*  --    < > 8* 15* 17* 17* 19*  GLUCOSE 135* 60*  --    < > 963* 488* 217* 130* 152*  BUN 19 16  --    < > 28* 26* 25* 25* 24*  CREATININE 1.38* 1.36*  --    < > 2.78* 2.70* 2.71* 2.46* 2.54*  CALCIUM 7.5* 7.5*  --    < > 8.2* 8.0* 7.8* 7.9* 7.8*  MG 1.6* 1.7 2.1  --   --   --   --   --   --   PHOS 3.5 3.1  --   --   --   --   --   --   --    < > = values in this interval not displayed.   GFR: Estimated Creatinine Clearance: 27.3 mL/min (A) (by C-G formula based on SCr of 2.54 mg/dL (H)). Liver Function Tests: Recent Labs  Lab 04/04/21 0925 04/05/21 0353  AST 15 16  ALT 14 12  ALKPHOS 103 94  BILITOT 0.5 0.5  PROT 4.8* 4.3*  ALBUMIN 1.6* 1.6*   No results for input(s): LIPASE, AMYLASE in the last 168 hours. No results for input(s): AMMONIA in the last 168 hours. Coagulation Profile: Recent Labs  Lab 04/08/21 1957  INR 1.2   Cardiac Enzymes: No results for input(s): CKTOTAL, CKMB, CKMBINDEX, TROPONINI in the last 168 hours. BNP (last 3 results) No results for input(s): PROBNP in the last 8760 hours. HbA1C: No results for input(s): HGBA1C in the last 72 hours. CBG: Recent Labs  Lab 04/09/21 0754 04/09/21 0901 04/09/21 0956 04/09/21 1110 04/09/21 1536  GLUCAP 133* 137* 121* 103* 103*   Lipid Profile: No results for input(s): CHOL, HDL, LDLCALC, TRIG, CHOLHDL, LDLDIRECT in the last 72 hours. Thyroid Function Tests: No results for input(s): TSH, T4TOTAL, FREET4, T3FREE, THYROIDAB in the last 72 hours. Anemia Panel: Recent Labs    04/09/21 0741  VITAMINB12 935*  FOLATE 6.5  FERRITIN 340*  TIBC 123*  IRON 45  RETICCTPCT 2.4   Urine analysis:    Component Value Date/Time   COLORURINE STRAW (A) 03/31/2021 1307   APPEARANCEUR HAZY (A) 03/31/2021 1307    APPEARANCEUR Hazy 11/10/2013 2043   LABSPEC 1.018 03/31/2021 1307   LABSPEC 1.031 11/10/2013 2043   PHURINE 5.0 03/31/2021 1307   GLUCOSEU >=500 (A) 03/31/2021 1307   GLUCOSEU >=500 11/10/2013 2043   HGBUR MODERATE (A) 03/31/2021 1307   BILIRUBINUR NEGATIVE 03/31/2021 1307   BILIRUBINUR negative 06/04/2018 1035   BILIRUBINUR Negative 11/24/2015 1443   BILIRUBINUR Negative 11/10/2013 2043   KETONESUR 20 (A) 03/31/2021 1307   PROTEINUR 100 (A) 03/31/2021 1307   UROBILINOGEN 0.2 09/14/2019 1732   NITRITE NEGATIVE 03/31/2021 1307   LEUKOCYTESUR NEGATIVE 03/31/2021 1307   LEUKOCYTESUR 1+ 11/10/2013 2043    Radiological Exams on Admission: DG Chest 1 View  Result Date: 04/09/2021 CLINICAL DATA:  Central line placement EXAM: CHEST  1 VIEW COMPARISON:  04/09/2021 FINDINGS: Single frontal view of the chest demonstrates interval placement of a left internal jugular catheter, tip overlying the atriocaval junction. Cardiac silhouette is stable. Bibasilar veiling opacities are unchanged. No pneumothorax. IMPRESSION: 1. Stable bibasilar veiling opacities consistent with consolidation and effusion. 2. No complication after central venous catheter placement. Electronically Signed   By: Randa Ngo M.D.   On: 04/09/2021 16:01   CT HUMERUS RIGHT WO CONTRAST  Result Date: 04/09/2021 CLINICAL DATA:  Forearm pain,  stress fracture suspected, no prior imaging Right upper extremity pain following infiltrated IV; Upper arm pain, stress fracture suspected, no prior imaging Right upper extremity pain following infiltrated IV EXAM: CT OF THE RIGHT FOREARM WITHOUT CONTRAST; CT OF THE RIGHT HUMERUS WITHOUT CONTRAST TECHNIQUE: Multidetector CT imaging was performed according to the standard protocol. Multiplanar CT image reconstructions were also generated. COMPARISON:  None. FINDINGS: Bones/Joint/Cartilage No acute fracture. No dislocation. No significant arthropathy. No bone erosion or periosteal elevation. Small  elbow joint effusion, nonspecific. Ligaments Suboptimally assessed by CT. Muscles and Tendons There is diffuse enlargement and heterogeneity of the forearm musculature involving both the flexor and extensor compartments. There are numerous foci of soft tissue gas within both compartments, more pronounced within the flexor compartment of the proximal to mid right forearm. No definite intramuscular fluid collection or extension of soft tissue gas into the upper arm. No gross tendinous abnormality. Soft tissues Marked circumferential soft tissue edema with ill-defined fluid throughout the right upper arm, forearm, and extending to the dorsal aspect of the wrist and hand. No obvious abscess within the subcutaneous soft tissues, although evaluation is slightly limited in the absence of intravenous contrast. Diffuse anasarca of the visualized right chest and abdominal wall. Moderate sized layering right-sided pleural effusion with patchy airspace opacity in the included aerated right lung. No right axillary lymphadenopathy. IMPRESSION: 1. Diffuse enlargement and heterogeneity of the forearm musculature involving both the flexor and extensor compartments. Numerous foci of soft tissue gas within both compartments, more pronounced within the flexor compartment of the proximal to mid right forearm. Appearance is suggestive of diffuse myositis with infectious pyomyositis or myonecrosis. 2. Marked circumferential soft tissue edema with ill-defined fluid throughout the right upper arm, forearm, and hand, most suggestive of cellulitis. No obvious abscess within the subcutaneous soft tissues, although evaluation is slightly limited in the absence of intravenous contrast. 3. Small elbow joint effusion, nonspecific. Septic arthritis not excluded. 4. No evidence of osteomyelitis. 5. Moderate sized layering right-sided pleural effusion with patchy airspace opacity in the visualized right lung. 6. Diffuse anasarca of the visualized  right chest and abdominal wall. These results will be called to the ordering clinician or representative by the Radiologist Assistant, and communication documented in the PACS or [employer]. Electronically Signed   By: Davina Poke D.O.   On: 04/09/2021 16:47   CT FOREARM RIGHT WO CONTRAST  Result Date: 04/09/2021 CLINICAL DATA:  Forearm pain, stress fracture suspected, no prior imaging Right upper extremity pain following infiltrated IV; Upper arm pain, stress fracture suspected, no prior imaging Right upper extremity pain following infiltrated IV EXAM: CT OF THE RIGHT FOREARM WITHOUT CONTRAST; CT OF THE RIGHT HUMERUS WITHOUT CONTRAST TECHNIQUE: Multidetector CT imaging was performed according to the standard protocol. Multiplanar CT image reconstructions were also generated. COMPARISON:  None. FINDINGS: Bones/Joint/Cartilage No acute fracture. No dislocation. No significant arthropathy. No bone erosion or periosteal elevation. Small elbow joint effusion, nonspecific. Ligaments Suboptimally assessed by CT. Muscles and Tendons There is diffuse enlargement and heterogeneity of the forearm musculature involving both the flexor and extensor compartments. There are numerous foci of soft tissue gas within both compartments, more pronounced within the flexor compartment of the proximal to mid right forearm. No definite intramuscular fluid collection or extension of soft tissue gas into the upper arm. No gross tendinous abnormality. Soft tissues Marked circumferential soft tissue edema with ill-defined fluid throughout the right upper arm, forearm, and extending to the dorsal aspect of the wrist and hand. No  obvious abscess within the subcutaneous soft tissues, although evaluation is slightly limited in the absence of intravenous contrast. Diffuse anasarca of the visualized right chest and abdominal wall. Moderate sized layering right-sided pleural effusion with patchy airspace opacity in the included  aerated right lung. No right axillary lymphadenopathy. IMPRESSION: 1. Diffuse enlargement and heterogeneity of the forearm musculature involving both the flexor and extensor compartments. Numerous foci of soft tissue gas within both compartments, more pronounced within the flexor compartment of the proximal to mid right forearm. Appearance is suggestive of diffuse myositis with infectious pyomyositis or myonecrosis. 2. Marked circumferential soft tissue edema with ill-defined fluid throughout the right upper arm, forearm, and hand, most suggestive of cellulitis. No obvious abscess within the subcutaneous soft tissues, although evaluation is slightly limited in the absence of intravenous contrast. 3. Small elbow joint effusion, nonspecific. Septic arthritis not excluded. 4. No evidence of osteomyelitis. 5. Moderate sized layering right-sided pleural effusion with patchy airspace opacity in the visualized right lung. 6. Diffuse anasarca of the visualized right chest and abdominal wall. These results will be called to the ordering clinician or representative by the Radiologist Assistant, and communication documented in the PACS or [employer]. Electronically Signed   By: Davina Poke D.O.   On: 04/09/2021 16:47   DG Chest Port 1 View  Result Date: 04/09/2021 CLINICAL DATA:  Hyperglycemia, chest pain EXAM: PORTABLE CHEST 1 VIEW COMPARISON:  Prior chest x-ray 04/01/2021 FINDINGS: Patient is rotated to the right. This distorts the cardiac and mediastinal contours. Persistent streaky airspace opacities in the left lung base. New developing linear opacities in right lung base. Additionally, there is new blunting of the right costophrenic angle consistent with a small pleural effusion. Cardiac and mediastinal contours remain grossly unchanged. No pneumothorax. No acute osseous abnormality. IMPRESSION: 1. Patient markedly rotated toward the right. 2. Probable bibasilar atelectasis. Superimposed infiltrate is  difficult to exclude. 3. Suspect small bilateral pleural effusions larger on the right than the left. Electronically Signed   By: Jacqulynn Cadet M.D.   On: 04/09/2021 07:47   ECHOCARDIOGRAM COMPLETE  Result Date: 04/09/2021    ECHOCARDIOGRAM REPORT   Patient Name:   Ashley Freeman [facility] Date of Exam: 04/09/2021 Medical Rec #:  [id number]                    Height:       63.0 in Accession #:    [id number]                   Weight:       136.7 lb Date of Birth:  1990-05-07                     BSA:          1.645 m Patient Age:    30 years                     BP:           142/76 mmHg Patient Gender: F                            HR:           101 bpm. Exam Location:  Inpatient Procedure: Limited Echo, Cardiac Doppler and Limited Color Doppler Indications:    Elevated Troponin, limited  History:        Patient has prior history of Echocardiogram  examinations, most                 recent 04/01/2021. Signs/Symptoms:Chest Pain and Shortness of                 Breath; Risk Factors:Diabetes.  Sonographer:    Glo Herring Referring Phys: [id number] [place]  1. Left ventricular ejection fraction, by estimation, is 55 to 60%. The left ventricle has normal function. The left ventricle has no regional wall motion abnormalities. Left ventricular diastolic parameters were normal.  2. Right ventricular systolic function is normal. The right ventricular size is normal. Tricuspid regurgitation signal is inadequate for assessing PA pressure.  3. A small pericardial effusion is present. The pericardial effusion is circumferential. There is no evidence of cardiac tamponade.  4. The mitral valve is grossly normal. Trivial mitral valve regurgitation. No evidence of mitral stenosis.  5. The aortic valve is tricuspid. Aortic valve regurgitation is not visualized. No aortic stenosis is present.  6. The inferior vena cava is normal in size with greater than 50% respiratory variability, suggesting right atrial  pressure of 3 mmHg. FINDINGS  Left Ventricle: Left ventricular ejection fraction, by estimation, is 55 to 60%. The left ventricle has normal function. The left ventricle has no regional wall motion abnormalities. The left ventricular internal cavity size was normal in size. There is  no left ventricular hypertrophy. Left ventricular diastolic parameters were normal. Right Ventricle: The right ventricular size is normal. No increase in right ventricular wall thickness. Right ventricular systolic function is normal. Tricuspid regurgitation signal is inadequate for assessing PA pressure. Left Atrium: Left atrial size was normal in size. Right Atrium: Right atrial size was normal in size. Pericardium: A small pericardial effusion is present. The pericardial effusion is circumferential. There is no evidence of cardiac tamponade. Presence of epicardial fat layer. Mitral Valve: The mitral valve is grossly normal. Trivial mitral valve regurgitation. No evidence of mitral valve stenosis. Tricuspid Valve: The tricuspid valve is grossly normal. Tricuspid valve regurgitation is trivial. No evidence of tricuspid stenosis. Aortic Valve: The aortic valve is tricuspid. Aortic valve regurgitation is not visualized. No aortic stenosis is present. Pulmonic Valve: The pulmonic valve was grossly normal. Pulmonic valve regurgitation is trivial. No evidence of pulmonic stenosis. Aorta: The aortic root is normal in size and structure. Venous: The inferior vena cava is normal in size with greater than 50% respiratory variability, suggesting right atrial pressure of 3 mmHg. IAS/Shunts: The atrial septum is grossly normal. Additional Comments: There is a small pleural effusion in the left lateral region.  LEFT VENTRICLE PLAX 2D LVIDd:         4.40 cm Diastology LVIDs:         3.20 cm LV e' medial:    9.03 cm/s LV PW:         0.90 cm LV E/e' medial:  11.1 LV IVS:        0.90 cm LV e' lateral:   10.80 cm/s                        LV E/e'  lateral: 9.2  IVC IVC diam: 1.20 cm LEFT ATRIUM         Index LA diam:    3.00 cm 1.82 cm/m  MITRAL VALVE MV Area (PHT): 5.23 cm MV Decel Time: 145 msec MV E velocity: 99.80 cm/s MV A velocity: 94.70 cm/s MV E/A ratio:  1.05 Eleonore Chiquito MD Electronically signed by  Eleonore Chiquito MD Signature Date/Time: 04/09/2021/1:08:51 PM    Final     Assessment/Plan DKA DMt1 Gastroparesis     - out of DKA     - continue SQ insulin, DM diet, glucose checks  Chest pain Elevated troponin     - continue ibuprofen 800 mg PO TID per cardiology recommendation     - spoke with cards team; they will continue to follow  Arm pain RUE cellulitis     - eval'd by hand surgery (Dr. Jenel Lucks): "No concern for compartment syndrome, necrotizing fasciitis, and asked swelling due to her IV infiltrated. No concern for severe soft tissue compromise. I would recommend elevation, warm compresses, and monitoring. No acute intervention indicated."      - continue zyvox, merrem for now; pharmacy dosing  Symptomatic anemia     - trend Hgb; transfuse as necessary  AKI on CKD3a     - fluids, follow SCr  Recent C diff inection     - continue PO vanc; diarrhea is improved  DVT prophylaxis: SCDs  Code Status: FULL  Family Communication: None at bedside  Consults called: Cardiology   Status is: Inpatient  Remains inpatient appropriate because: severity illness  Jonnie Finner DO [facility]  If 7PM-7AM, please contact night-coverage [email or web address]  04/10/2021, 3:05 PM

## 2021-04-11 ENCOUNTER — Encounter (HOSPITAL_COMMUNITY): Payer: Self-pay | Admitting: Internal Medicine

## 2021-04-11 ENCOUNTER — Other Ambulatory Visit: Payer: Self-pay

## 2021-04-11 DIAGNOSIS — E1010 Type 1 diabetes mellitus with ketoacidosis without coma: Secondary | ICD-10-CM

## 2021-04-11 DIAGNOSIS — I248 Other forms of acute ischemic heart disease: Secondary | ICD-10-CM | POA: Diagnosis not present

## 2021-04-11 DIAGNOSIS — R0789 Other chest pain: Secondary | ICD-10-CM | POA: Diagnosis not present

## 2021-04-11 LAB — CBC
HCT: 27.9 % — ABNORMAL LOW (ref 36.0–46.0)
Hemoglobin: 9.1 g/dL — ABNORMAL LOW (ref 12.0–15.0)
MCH: 27.7 pg (ref 26.0–34.0)
MCHC: 32.6 g/dL (ref 30.0–36.0)
MCV: 84.8 fL (ref 80.0–100.0)
Platelets: 292 10*3/uL (ref 150–400)
RBC: 3.29 MIL/uL — ABNORMAL LOW (ref 3.87–5.11)
RDW: 16.1 % — ABNORMAL HIGH (ref 11.5–15.5)
WBC: 9.2 10*3/uL (ref 4.0–10.5)
nRBC: 0 % (ref 0.0–0.2)

## 2021-04-11 LAB — COMPREHENSIVE METABOLIC PANEL
ALT: 19 U/L (ref 0–44)
AST: 41 U/L (ref 15–41)
Albumin: 1.6 g/dL — ABNORMAL LOW (ref 3.5–5.0)
Alkaline Phosphatase: 109 U/L (ref 38–126)
Anion gap: 6 (ref 5–15)
BUN: 21 mg/dL — ABNORMAL HIGH (ref 6–20)
CO2: 19 mmol/L — ABNORMAL LOW (ref 22–32)
Calcium: 7.7 mg/dL — ABNORMAL LOW (ref 8.9–10.3)
Chloride: 112 mmol/L — ABNORMAL HIGH (ref 98–111)
Creatinine, Ser: 1.87 mg/dL — ABNORMAL HIGH (ref 0.44–1.00)
GFR, Estimated: 37 mL/min — ABNORMAL LOW (ref >60)
Glucose, Bld: 114 mg/dL — ABNORMAL HIGH (ref 70–99)
Potassium: 3.8 mmol/L (ref 3.5–5.1)
Sodium: 137 mmol/L (ref 135–145)
Total Bilirubin: 0.3 mg/dL (ref 0.3–1.2)
Total Protein: 4.5 g/dL — ABNORMAL LOW (ref 6.5–8.1)

## 2021-04-11 LAB — GLUCOSE, CAPILLARY
Glucose-Capillary: 163 mg/dL — ABNORMAL HIGH (ref 70–99)
Glucose-Capillary: 191 mg/dL — ABNORMAL HIGH (ref 70–99)
Glucose-Capillary: 70 mg/dL (ref 70–99)

## 2021-04-11 MED ORDER — KETOROLAC TROMETHAMINE 15 MG/ML IJ SOLN
15.0000 mg | INTRAMUSCULAR | Status: AC
  Administered 2021-04-11: 15 mg via INTRAVENOUS
  Filled 2021-04-11: qty 1

## 2021-04-11 MED ORDER — CEFTRIAXONE SODIUM 2 G IJ SOLR
2.0000 g | INTRAMUSCULAR | Status: DC
  Administered 2021-04-11 – 2021-04-13 (×3): 2 g via INTRAVENOUS
  Filled 2021-04-11 (×3): qty 20

## 2021-04-11 MED ORDER — HYDROMORPHONE HCL 1 MG/ML IJ SOLN
0.5000 mg | Freq: Once | INTRAMUSCULAR | Status: AC
  Administered 2021-04-11: 0.5 mg via INTRAVENOUS
  Filled 2021-04-11: qty 0.5

## 2021-04-11 NOTE — Progress Notes (Signed)
PROGRESS NOTE    Ashley Freeman  TML:[id number] DOB: [birth year] DOA: 04/10/2021 PCP: [name], PA-C    Brief Narrative:  Ashley Freeman is a 30 year old female with past medical history significant for type 1 diabetes mellitus, CKD stage IIIa, GERD, essential hypertension, gastroparesis, medical noncompliance who presented to [facility] ED on 12/10 via EMS complaining of diffuse pain.  On EMS arrival, patient states her insulin pump had failed requiring her to take injections.  Patient complaining of diffuse body aches, nausea/vomiting, diarrhea over the last few days.  Did not try any medications for symptoms at home.  Given symptoms did not resolve, EMS was contacted and transported to the ED.  In the ED, temperature 97.6 F, HR 114, RR 21, BP 96/55, SPO2 100% on room air.  Sodium 126, potassium 5.9, chloride 98, CO2 less than 7, glucose 1191 and an anion gap that was on unable to be calculated.  VBG with pH 7.044.  WBC 20.3, hemoglobin 7.6, platelets 374, MCV 102.1.  Beta hydroxybutyrate acid greater than 8.00.  hCG negative.  COVID-19 PCR negative peer influenza A/B PCR negative.  High sensitive troponin 496.  PCCM was consulted, reviewed and felt that she was appropriate in the stepdown unit on the medicine service.  Cardiology consulted for elevated troponin.  Hospital service was consulted for further evaluation and management.  She was transferred to [facility] for evaluation w/ the hand surgery team. After their evaluation, they did not see concern for compartment syndrome or necrotizing injury. They recommended elevation of arm and warm compresses. She was returned to [facility] for care on 04/10/2021.    04/11/2021: Patient was seen and examined at her bedside.  Mainly complains of chest pain not improved with ibuprofen or IV Toradol.  Seen by cardiology.  We will obtain a twelve-lead EKG.   Assessment & Plan:   Active Problems:   DKA (diabetic ketoacidosis)  ([place])   Right forearm necrotizing infection with myositis/necrosis concerning for compartment syndrome While receiving blood transfusion this morning, patient's IV infiltrated with patient experiencing severe pain to her right forearm.  Patient was having paresthesias to her fingers with decreased finger flexion/extension and significant pain on palpation of the anterior forearm.  Radial and ulnar pulses were intact.  CT right forearm/humerus without contrast notable for diffuse enlargement/heterogenicity forearm musculature both flexor/extensor compartments with numerous foci of soft tissue gas, suggestive of diffuse myositis with infectious Pyo myositis or myonecrosis with circumferential soft tissue edema and ill-defined fluid throughout right upper arm/forearm/hand suggestive of cellulitis.  No abscess identified but noncontrasted study.  Discussed with orthopedics on-call, Dr. Lynann Bologna who recommended transfer to [facility] for hand surgery evaluation as there is no hand surgeon on-call in [place] currently. --Checking CK level --Blood cultures x2: Ordered Received IV Zyvox and meropenem Antibiotics switched to Rocephin and linezolid continued on 04/11/2021.  Diabetic ketoacidosis Hx type 1 diabetes mellitus, poorly controlled Patient representing to the ED following recent discharge with diffuse body aches, difficulty with her insulin pump at home and was found to have an elevated glucose greater than 1000 with elevated beta hydroxybutyrate acid.  Etiology likely secondary to noncompliance given her history and multiple hospitalizations for same.  Hemoglobin A1c 10.5 on 03/07/2021.  Initially started on insulin drip with resolution of DKA with normalization of anion gap and beta hydroxybutyrate acid. --Diabetic educator following --Transition insulin drip to Semglee 10 units subcutaneously daily --NovoLog 2u TIDAC if eating >50% meals --SSI for coverage --CBGs qAC/HS continue gentle IV  fluid hydration. Tolerating a regular consistency diet.  Chest pain, ongoing Elevated troponin Patient presenting with diffuse body aches/pain.  Initial troponin elevated at 496.  EKG with sinus tachycardia, rate 113, QTc 491, no concerning dynamic changes.  Unclear etiology. --Cardiology following, appreciate assistance --hs Troponin 496>[id number] --TTE: LVEF 55-60%, no LV RWMA, small circumfrential pericardial effusion, IVC normal --Ibuprofen 800 mg 3 times daily with Carafate/Pepcid --Cardiology recommends stopping heparin drip as this is not ACS, consideration of possible myocarditis with consideration of follow-up with cardiac MRI. --Continue to monitor on telemetry Reports reproducible chest pain on 04/11/2021 not improved with ibuprofen or IV Toradol. Seen by cardiology Will obtain twelve-lead EKG to further assess 1 dose of IV Dilaudid 0.5 mg has been ordered.  Symptomatic anemia Hemoglobin on admission 7.6.  Recently discharged was 7.7.  Overnight dropped to 6.7.  Unclear etiology.  Anemia panel with iron 45, TIBC 123, ferritin 340, vitamin B12 935, folate 6.5. --Hgb 7.7>>6.7; transfusing 2 unit PRBCs --Peripheral smear --CBC daily; goal hemoglobin > 8.0 given chest pain  Leukocytosis Recent C. difficile colitis infection Recently diagnosed with C. difficile colitis, discharged on oral vancomycin.  WBC count elevated 20.3 on admission. --Continue Flagyl 500 mg IV q12h until tolerating oral intake --CBC daily  Gastroparesis --Reglan 5 mg IV q8h scheduled --Zofran 4 mg IV every 8 hours as needed nausea/vomiting Diet has been advanced.  Acute renal failure on CKD stage IIIa Patient presenting with a creatinine of 2.78.  On recent discharge 04/05/2021 was 1.36.  Etiology likely secondary to dehydration from DKA as above. --Cr 2.78>>2.46 --Continue IVF hydration with NS at 75 mL/h --Avoid nephrotoxins, renal dose all medications. --BMP daily  Non anion gap metabolic  acidosis, improving, secondary to renal insufficiency. Serum bicarb uptrending 19 from 17, anion gap 6 Continue gentle IV fluid hydration Start p.o. sodium bicarb  DVT prophylaxis: SCDs Start: 04/10/21 1549SCDs   Code Status: Full Code Family Communication: No family present at bedside this morning.  Disposition Plan:  Level of care: Progressive Status is: Inpatient  Remains inpatient appropriate because: Remains on insulin drip, on IV fluids, continues with nausea/vomiting, on heparin drip for NSTEMI    Consultants:  PCCM Cardiology  Procedures:  TTE  Antimicrobials:  Metronidazole 12/10>>   Objective: Vitals:   04/11/21 0443 04/11/21 0500  BP:  (!) 152/109  Temp:  97.8 F (36.6 C)  TempSrc:  Oral  Weight: 67.2 kg   Height: 5\' 3"  (1.6 m)     Intake/Output Summary (Last 24 hours) at 04/11/2021 1250 Last data filed at 04/11/2021 0551 Gross per 24 hour  Intake 895.17 ml  Output --  Net 895.17 ml   Filed Weights   04/11/21 0443  Weight: 67.2 kg    Examination:  General exam: Well-developed well-nourished in no acute distress.  She is alert and oriented x3.   Respiratory system: Clear to auscultation with no wheezes or rales.   Cardiovascular system: Regular rate and rhythm no rubs or gallops.  No JVD or thyromegaly noted.   Gastrointestinal system: Abdomen soft nontender normal bowel sounds present.   Central nervous system: Alert and oriented moves all 4 extremities.   Extremities: Dependent edema in all 4 extremities.   Skin: No rashes or ulcerative lesions noted. Psychiatry: Mood is appropriate for condition and setting.   Data Reviewed: I have personally reviewed following labs and imaging studies  CBC: Recent Labs  Lab 04/05/21 0353 04/08/21 0728 04/08/21 1957 04/09/21 0139 04/09/21 0534 04/09/21 0741 04/10/21 2222  04/11/21 0417  WBC 13.3* 20.3*  --   --  21.7*  --  10.1 9.2  NEUTROABS  --  13.1*  --   --   --   --  7.0  --   HGB 7.7*  7.6*   < > 6.7* 7.2* 7.1* 8.8* 9.1*  HCT 23.8* 28.8*   < > 21.4* 22.9* 21.9* 27.3* 27.9*  MCV 83.8 102.1*  --   --  85.4  --  85.6 84.8  PLT 272 371  --   --  369  --  295 292   < > = values in this interval not displayed.   Basic Metabolic Panel: Recent Labs  Lab 04/05/21 0353 04/08/21 0725 04/08/21 0728 04/09/21 0139 04/09/21 0534 04/09/21 0741 04/10/21 2222 04/11/21 0417  NA 135  --    < > 136 135 134* 136 137  K 3.6  --    < > 3.1* 3.6 3.7 3.7 3.8  CL 111  --    < > 110 110 110 113* 112*  CO2 21*  --    < > 17* 17* 19* 19* 19*  GLUCOSE 60*  --    < > 217* 130* 152* 152* 114*  BUN 16  --    < > 25* 25* 24* 22* 21*  CREATININE 1.36*  --    < > 2.71* 2.46* 2.54* 2.11* 1.87*  CALCIUM 7.5*  --    < > 7.8* 7.9* 7.8* 7.4* 7.7*  MG 1.7 2.1  --   --   --   --   --   --   PHOS 3.1  --   --   --   --   --   --   --    < > = values in this interval not displayed.   GFR: Estimated Creatinine Clearance: 41.2 mL/min (A) (by C-G formula based on SCr of 1.87 mg/dL (H)). Liver Function Tests: Recent Labs  Lab 04/05/21 0353 04/10/21 2222 04/11/21 0417  AST 16 35 41  ALT 12 19 19   ALKPHOS 94 98 109  BILITOT 0.5 0.5 0.3  PROT 4.3* 4.6* 4.5*  ALBUMIN 1.6* 1.6* 1.6*   No results for input(s): LIPASE, AMYLASE in the last 168 hours. No results for input(s): AMMONIA in the last 168 hours. Coagulation Profile: Recent Labs  Lab 04/08/21 1957  INR 1.2   Cardiac Enzymes: No results for input(s): CKTOTAL, CKMB, CKMBINDEX, TROPONINI in the last 168 hours. BNP (last 3 results) No results for input(s): PROBNP in the last 8760 hours. HbA1C: No results for input(s): HGBA1C in the last 72 hours. CBG: Recent Labs  Lab 04/09/21 1110 04/09/21 1536 04/10/21 1552 04/10/21 2155 04/11/21 0755  GLUCAP 103* 103* 165* 146* 163*   Lipid Profile: No results for input(s): CHOL, HDL, LDLCALC, TRIG, CHOLHDL, LDLDIRECT in the last 72 hours. Thyroid Function Tests: No results for input(s): TSH,  T4TOTAL, FREET4, T3FREE, THYROIDAB in the last 72 hours. Anemia Panel: Recent Labs    04/09/21 0741  VITAMINB12 935*  FOLATE 6.5  FERRITIN 340*  TIBC 123*  IRON 45  RETICCTPCT 2.4   Sepsis Labs: No results for input(s): PROCALCITON, LATICACIDVEN in the last 168 hours.  Recent Results (from the past 240 hour(s))  Resp Panel by RT-PCR (Flu A&B, Covid) Nasopharyngeal Swab     Status: None   Collection Time: 04/08/21  7:20 AM   Specimen: Nasopharyngeal Swab; Nasopharyngeal(NP) swabs in vial transport medium  Result Value Ref Range Status  SARS Coronavirus 2 by RT PCR NEGATIVE NEGATIVE Final    Comment: (NOTE) SARS-CoV-2 target nucleic acids are NOT DETECTED.  The SARS-CoV-2 RNA is generally detectable in upper respiratory specimens during the acute phase of infection. The lowest concentration of SARS-CoV-2 viral copies this assay can detect is 138 copies/mL. A negative result does not preclude SARS-Cov-2 infection and should not be used as the sole basis for treatment or other patient management decisions. A negative result may occur with  improper specimen collection/handling, submission of specimen other than nasopharyngeal swab, presence of viral mutation(s) within the areas targeted by this assay, and inadequate number of viral copies(<138 copies/mL). A negative result must be combined with clinical observations, patient history, and epidemiological information. The expected result is Negative.  Fact Sheet for Patients:  EntrepreneurPulse.com.au  Fact Sheet for Healthcare Providers:  IncredibleEmployment.be  This test is no t yet approved or cleared by the [place] FDA and  has been authorized for detection and/or diagnosis of SARS-CoV-2 by FDA under an Emergency Use Authorization (EUA). This EUA will remain  in effect (meaning this test can be used) for the duration of the COVID-19 declaration under Section 564(b)(1) of the Act,  21 U.S.C.section 360bbb-3(b)(1), unless the authorization is terminated  or revoked sooner.       Influenza A by PCR NEGATIVE NEGATIVE Final   Influenza B by PCR NEGATIVE NEGATIVE Final    Comment: (NOTE) The Xpert Xpress SARS-CoV-2/FLU/RSV plus assay is intended as an aid in the diagnosis of influenza from Nasopharyngeal swab specimens and should not be used as a sole basis for treatment. Nasal washings and aspirates are unacceptable for Xpert Xpress SARS-CoV-2/FLU/RSV testing.  Fact Sheet for Patients: EntrepreneurPulse.com.au  Fact Sheet for Healthcare Providers: IncredibleEmployment.be  This test is not yet approved or cleared by the [place] FDA and has been authorized for detection and/or diagnosis of SARS-CoV-2 by FDA under an Emergency Use Authorization (EUA). This EUA will remain in effect (meaning this test can be used) for the duration of the COVID-19 declaration under Section 564(b)(1) of the Act, 21 U.S.C. section 360bbb-3(b)(1), unless the authorization is terminated or revoked.  Performed at [facility], [place] [address], [place], [place] [address]          Radiology Studies: DG Chest 1 View  Result Date: 04/09/2021 CLINICAL DATA:  Central line placement EXAM: CHEST  1 VIEW COMPARISON:  04/09/2021 FINDINGS: Single frontal view of the chest demonstrates interval placement of a left internal jugular catheter, tip overlying the atriocaval junction. Cardiac silhouette is stable. Bibasilar veiling opacities are unchanged. No pneumothorax. IMPRESSION: 1. Stable bibasilar veiling opacities consistent with consolidation and effusion. 2. No complication after central venous catheter placement. Electronically Signed   By: Randa Ngo M.D.   On: 04/09/2021 16:01   CT HUMERUS RIGHT WO CONTRAST  Result Date: 04/09/2021 CLINICAL DATA:  Forearm pain, stress fracture suspected, no prior imaging Right upper  extremity pain following infiltrated IV; Upper arm pain, stress fracture suspected, no prior imaging Right upper extremity pain following infiltrated IV EXAM: CT OF THE RIGHT FOREARM WITHOUT CONTRAST; CT OF THE RIGHT HUMERUS WITHOUT CONTRAST TECHNIQUE: Multidetector CT imaging was performed according to the standard protocol. Multiplanar CT image reconstructions were also generated. COMPARISON:  None. FINDINGS: Bones/Joint/Cartilage No acute fracture. No dislocation. No significant arthropathy. No bone erosion or periosteal elevation. Small elbow joint effusion, nonspecific. Ligaments Suboptimally assessed by CT. Muscles and Tendons There is diffuse enlargement and heterogeneity of the forearm  musculature involving both the flexor and extensor compartments. There are numerous foci of soft tissue gas within both compartments, more pronounced within the flexor compartment of the proximal to mid right forearm. No definite intramuscular fluid collection or extension of soft tissue gas into the upper arm. No gross tendinous abnormality. Soft tissues Marked circumferential soft tissue edema with ill-defined fluid throughout the right upper arm, forearm, and extending to the dorsal aspect of the wrist and hand. No obvious abscess within the subcutaneous soft tissues, although evaluation is slightly limited in the absence of intravenous contrast. Diffuse anasarca of the visualized right chest and abdominal wall. Moderate sized layering right-sided pleural effusion with patchy airspace opacity in the included aerated right lung. No right axillary lymphadenopathy. IMPRESSION: 1. Diffuse enlargement and heterogeneity of the forearm musculature involving both the flexor and extensor compartments. Numerous foci of soft tissue gas within both compartments, more pronounced within the flexor compartment of the proximal to mid right forearm. Appearance is suggestive of diffuse myositis with infectious pyomyositis or myonecrosis. 2.  Marked circumferential soft tissue edema with ill-defined fluid throughout the right upper arm, forearm, and hand, most suggestive of cellulitis. No obvious abscess within the subcutaneous soft tissues, although evaluation is slightly limited in the absence of intravenous contrast. 3. Small elbow joint effusion, nonspecific. Septic arthritis not excluded. 4. No evidence of osteomyelitis. 5. Moderate sized layering right-sided pleural effusion with patchy airspace opacity in the visualized right lung. 6. Diffuse anasarca of the visualized right chest and abdominal wall. These results will be called to the ordering clinician or representative by the Radiologist Assistant, and communication documented in the PACS or [employer]. Electronically Signed   By: Davina Poke D.O.   On: 04/09/2021 16:47   CT FOREARM RIGHT WO CONTRAST  Result Date: 04/09/2021 CLINICAL DATA:  Forearm pain, stress fracture suspected, no prior imaging Right upper extremity pain following infiltrated IV; Upper arm pain, stress fracture suspected, no prior imaging Right upper extremity pain following infiltrated IV EXAM: CT OF THE RIGHT FOREARM WITHOUT CONTRAST; CT OF THE RIGHT HUMERUS WITHOUT CONTRAST TECHNIQUE: Multidetector CT imaging was performed according to the standard protocol. Multiplanar CT image reconstructions were also generated. COMPARISON:  None. FINDINGS: Bones/Joint/Cartilage No acute fracture. No dislocation. No significant arthropathy. No bone erosion or periosteal elevation. Small elbow joint effusion, nonspecific. Ligaments Suboptimally assessed by CT. Muscles and Tendons There is diffuse enlargement and heterogeneity of the forearm musculature involving both the flexor and extensor compartments. There are numerous foci of soft tissue gas within both compartments, more pronounced within the flexor compartment of the proximal to mid right forearm. No definite intramuscular fluid collection or extension of soft  tissue gas into the upper arm. No gross tendinous abnormality. Soft tissues Marked circumferential soft tissue edema with ill-defined fluid throughout the right upper arm, forearm, and extending to the dorsal aspect of the wrist and hand. No obvious abscess within the subcutaneous soft tissues, although evaluation is slightly limited in the absence of intravenous contrast. Diffuse anasarca of the visualized right chest and abdominal wall. Moderate sized layering right-sided pleural effusion with patchy airspace opacity in the included aerated right lung. No right axillary lymphadenopathy. IMPRESSION: 1. Diffuse enlargement and heterogeneity of the forearm musculature involving both the flexor and extensor compartments. Numerous foci of soft tissue gas within both compartments, more pronounced within the flexor compartment of the proximal to mid right forearm. Appearance is suggestive of diffuse myositis with infectious pyomyositis or myonecrosis. 2. Marked circumferential soft tissue  edema with ill-defined fluid throughout the right upper arm, forearm, and hand, most suggestive of cellulitis. No obvious abscess within the subcutaneous soft tissues, although evaluation is slightly limited in the absence of intravenous contrast. 3. Small elbow joint effusion, nonspecific. Septic arthritis not excluded. 4. No evidence of osteomyelitis. 5. Moderate sized layering right-sided pleural effusion with patchy airspace opacity in the visualized right lung. 6. Diffuse anasarca of the visualized right chest and abdominal wall. These results will be called to the ordering clinician or representative by the Radiologist Assistant, and communication documented in the PACS or [employer]. Electronically Signed   By: Davina Poke D.O.   On: 04/09/2021 16:47        Scheduled Meds:  amLODipine  10 mg Oral Daily   Chlorhexidine Gluconate Cloth  6 each Topical Daily   famotidine  20 mg Oral BID    HYDROmorphone  (DILAUDID) injection  0.5 mg Intravenous Once   ibuprofen  800 mg Oral TID   insulin aspart  0-15 Units Subcutaneous TID WC   insulin aspart  0-5 Units Subcutaneous QHS   insulin glargine-yfgn  10 Units Subcutaneous Daily   lamoTRIgine  150 mg Oral Daily   linezolid  600 mg Oral Q12H   sodium chloride flush  10-40 mL Intracatheter Q12H   sucralfate  1 g Oral TID WC & HS   vancomycin  125 mg Oral QID   Continuous Infusions:  sodium chloride 100 mL/hr at 04/10/21 1800   cefTRIAXone (ROCEPHIN)  IV       LOS: 1 day    Critical Care Time Upon my evaluation, this patient had a high probability of imminent or life-threatening deterioration due to DKA requiring insulin drip, NSTEMI, which required my direct attention, intervention, and personal management.  I have personally provided 49 minutes of critical care time exclusive of my time spent on separately billable procedures.  Time includes review of laboratory data, radiology results, discussion with consultants, and monitoring for potential decompensation.       Kayleen Memos, DO [facility] Available via Epic secure chat 7am-7pm After these hours, please refer to coverage provider listed on [email or web address] 04/11/2021, 12:50 PM

## 2021-04-11 NOTE — Plan of Care (Signed)
  Problem: Education: Goal: Knowledge of General Education information will improve Description: Including pain rating scale, medication(s)/side effects and non-pharmacologic comfort measures Outcome: Progressing   Problem: Health Behavior/Discharge Planning: Goal: Ability to manage health-related needs will improve Outcome: Progressing   Problem: Clinical Measurements: Goal: Respiratory complications will improve Outcome: Progressing   Problem: Activity: Goal: Risk for activity intolerance will decrease Outcome: Progressing   

## 2021-04-11 NOTE — Consult Note (Addendum)
Cardiology Consultation:   Patient ID: CASY TAVANO MRN: [id number]; DOB: 11/11/1990  Admit date: 04/10/2021 Date of Consult: 04/11/2021  PCP:  [name], PA-C   [facility] Providers Cardiologist:  Jenkins Rouge, MD        Patient Profile:   HAEDYN BREAU is a 30 y.o. female with a hx of DMI w/ poor control, HLD, anemia since 03/2019, DKA, AKI, who is being seen 04/11/2021 for the evaluation of chest pain at the request of Dr. Nevada Crane.  History of Present Illness:   [name] Has had several admissions for DKA.   12/21-11/25/2022 admission to [facility] was DKA in the setting of COVID-19 and influenza.  According to her, she has not been right since then.  03/31/21 admit was provoked by c difficile colitis; discharged 12/07 on PO vancomycin. In that admission the paitnet was hypotensive and hypothermic with pH 6.9 and WBC 55 requiring ICU admission. Peak troponin on this admission of 220.   Presented back 12/10 with elevated glucose levels and diffuse pain. Found to be in DKA again with glucose 1191, bicarb 7, ph 7.04. Stable hgb of 7.6, no signs of bleeding. Aki cr 2.2. During her stay, she developed a rigth forearm necrotizing infection w/ myositis. She was transferred to [facility] for evaluation w/ the hand surgery team. After their evaluation, they did not see concern for compartment syndrome or necrotizing injury. They recommended elevation of arm and warm compresses. She was returned to [facility] for care on 12/12.    She was admitted 12/12 for ongoing management of DKA, arm wound and chest pain. Cards asked to see.   Dr Johnsie Cancel saw 12/11, chest pain felt non-cardiac, recommended to check smear to r/o sickle cell dz, ECG not c/w pericarditis, echo ok. Can consider cardiac MRI to r/o myocarditis. Pain not felt coronary in nature, heparin not needed.   Cards asked to see again for chest pain.   Ms. Bobbye Charleston can point to a spot to the right of the  mid sternum as a location for the chest pain.  Sometimes it hurts worse with deep inspiration.  There is significant chest wall tenderness.  The pain medications they are giving her, she says do not do much good.  She has a 71-year-old daughter.  Her father is currently taking care of the baby.  It is just she and her daughter at the house.  She feels that her glucoses were fairly well controlled until her admission to [place] in November for COVID and the flu.  Ever since then, she has struggled.  She does not remember exactly when the chest pain started, but it has been continuous for a while.  She does not know of any family members who have sickle cell anemia, or sickle cell trait.   Past Medical History:  Diagnosis Date   Abscess, gluteal, right 08/24/2013   AKI (acute kidney injury) ([place]) 07/26/2014   Anemia 02/19/2012   Bartholin's gland abscess 09/19/2013   BV (bacterial vaginosis) 11/24/2015   Diabetes mellitus type I ([place]) 2001   Diagnosed at age 8 ; Type I   Diarrhea 05/30/2016   DKA (diabetic ketoacidoses) 08/19/2013   Also in 2018   Gonorrhea 08/2011   Treated in 09/2011   History of trichomoniasis 05/31/2016   Hyperlipidemia 03/28/2016   Sepsis ([place]) 09/19/2013    Past Surgical History:  Procedure Laterality Date   CESAREAN SECTION N/A 10/05/2019   Procedure: CESAREAN SECTION;  Surgeon: Aletha Halim, MD;  Location:  [facility];  Service: Obstetrics;  Laterality: N/A;   INCISION AND DRAINAGE ABSCESS Left 09/28/2019   Procedure: INCISION AND DRAINAGE VULVAR ABCESS;  Surgeon: [name], MD;  Location: [place];  Service: Gynecology;  Laterality: Left;   INCISION AND DRAINAGE PERIRECTAL ABSCESS Right 08/18/2013   Procedure: IRRIGATION AND DEBRIDEMENT GLUTEAL ABSCESS;  Surgeon: Ralene Ok, MD;  Location: [place];  Service: General;  Laterality: Right;   INCISION AND DRAINAGE PERIRECTAL ABSCESS Right 09/19/2013   Procedure: IRRIGATION AND DEBRIDEMENT RIGHT GLUTEAL AND  LABIAL ABSCESSES;  Surgeon: Ralene Ok, MD;  Location: [place];  Service: General;  Laterality: Right;   INCISION AND DRAINAGE PERIRECTAL ABSCESS Right 09/24/2013   Procedure: IRRIGATION AND DEBRIDEMENT PERIRECTAL ABSCESS;  Surgeon: Gwenyth Ober, MD;  Location: [place];  Service: General;  Laterality: Right;     Home Medications:  Prior to Admission medications   Medication Sig Start Date End Date Taking? Authorizing Provider  albuterol (VENTOLIN HFA) 108 (90 Base) MCG/ACT inhaler Inhale 2 puffs into the lungs every 4 (four) hours as needed for wheezing or shortness of breath. 08/09/20  Yes [provider]  amLODipine (NORVASC) 10 MG tablet Take 1 tablet (10 mg total) by mouth daily. 03/24/21 04/23/21 Yes Richarda Osmond, MD  aspirin 325 MG tablet Take 1 tablet (325 mg total) by mouth once for 1 dose. Patient taking differently: Take 325 mg by mouth daily. 04/10/21 04/11/21 Yes [place] Eric J, DO  famotidine (PEPCID) 20 MG tablet Take 1 tablet (20 mg total) by mouth 2 (two) times daily. 04/05/21 05/05/21 Yes Alma Friendly, MD  ibuprofen (ADVIL) 800 MG tablet Take 1 tablet (800 mg total) by mouth 3 (three) times daily. 04/10/21  Yes [place] Eric J, DO  insulin aspart (NOVOLOG) 100 UNIT/ML injection Inject 0-6 Units into the skin 3 (three) times daily with meals. Patient taking differently: Inject 0-6 Units into the skin 3 (three) times daily with meals. Sliding scale 04/10/21  Yes [place] Donnamarie Poag, DO  insulin glargine-yfgn (SEMGLEE) 100 UNIT/ML injection Inject 0.1 mLs (10 Units total) into the skin daily. 04/10/21  Yes [place] Eric J, DO  blood glucose meter kit and supplies KIT Dispense based on patient and insurance preference. Use up to four times daily as directed. (FOR ICD-9 250.00, 250.01). 05/25/17   Isla Pence, MD  Blood Pressure Monitoring (BLOOD PRESSURE KIT) DEVI 1 Device by Does not apply route as needed. 05/06/19   Anyanwu, Sallyanne Havers, MD  Continuous Blood Gluc  Sensor (DEXCOM G6 SENSOR) MISC Inject 1 Device into the skin as directed. 12/16/20   Shamleffer, Melanie Crazier, MD  Continuous Blood Gluc Transmit (DEXCOM G6 TRANSMITTER) MISC Inject 1 Device into the skin as directed. Use to check blood sugar daily 12/16/20   Shamleffer, Melanie Crazier, MD  Insulin Disposable Pump (OMNIPOD 5 G6 INTRO, GEN 5,) KIT 1 Device by Does not apply route every 3 (three) days. Patient taking differently: 1 Device by Does not apply route every 3 (three) days. 100 units for 72 hours 12/16/20   Shamleffer, Melanie Crazier, MD  Insulin Pen Needle 32G X 4 MM MISC Use as directed 10/07/20   Ghimire, Henreitta Leber, MD  ipratropium (ATROVENT HFA) 17 MCG/ACT inhaler Inhale 1 puff into the lungs every 6 (six) hours. 03/24/21   Richarda Osmond, MD  lamoTRIgine (LAMICTAL) 150 MG tablet Take 150 mg by mouth daily. 03/29/21   [provider]  linezolid (ZYVOX) 600 MG/300ML IVPB Inject 300 mLs (600  mg total) into the vein every 12 (twelve) hours. 04/10/21   [place] Eric J, DO  meropenem 1 g in sodium chloride 0.9 % 100 mL Inject 1 g into the vein every 12 (twelve) hours. 04/10/21   [place] Donnamarie Poag, DO  metoCLOPramide (REGLAN) 5 MG/ML injection Inject 1 mL (5 mg total) into the vein every 8 (eight) hours. 04/10/21   [place] Eric J, DO  metroNIDAZOLE (FLAGYL) 500 MG/100ML Inject 100 mLs (500 mg total) into the vein every 8 (eight) hours. 04/10/21   [place] Donnamarie Poag, DO  morphine 4 MG/ML injection Inject 1 mL (4 mg total) into the vein every 2 (two) hours as needed for severe pain. 04/10/21   [place] Donnamarie Poag, DO  nystatin (MYCOSTATIN) 100000 UNIT/ML suspension Take 5 mLs (500,000 Units total) by mouth 4 (four) times daily. 04/05/21   Alma Friendly, MD  ondansetron (ZOFRAN) 4 MG/2ML SOLN injection Inject 2 mLs (4 mg total) into the vein every 8 (eight) hours as needed for nausea or vomiting. 04/10/21   [place] Donnamarie Poag, DO  sucralfate (CARAFATE) 1 g tablet Take 1 tablet (1 g total)  by mouth 4 (four) times daily -  with meals and at bedtime. 04/05/21 05/05/21  Alma Friendly, MD  vancomycin (VANCOCIN) 125 MG capsule Take 1 capsule (125 mg total) by mouth 4 (four) times daily for 5 days. Patient taking differently: Take 125 mg by mouth 4 (four) times daily. Start date : 04/05/21 04/06/21 04/11/21  Alma Friendly, MD  Ferrous Sulfate (IRON) 325 (65 Fe) MG TABS Take 1 tablet (325 mg total) by mouth every other day. Patient not taking: Reported on 07/25/2020 06/17/19 07/25/20  Chauncey Mann, MD  hydrochlorothiazide (HYDRODIURIL) 25 MG tablet Take 1 tablet (25 mg total) by mouth daily. Patient not taking: No sig reported 10/08/19 07/25/20  Constant, Peggy, MD  promethazine (PHENERGAN) 25 MG tablet Take 1 tablet (25 mg total) by mouth every 8 (eight) hours as needed for nausea or vomiting (if zofran fails). Patient not taking: Reported on 10/06/2020 08/25/20 10/06/20  Varney Biles, MD    Inpatient Medications: Scheduled Meds:  amLODipine  10 mg Oral Daily   Chlorhexidine Gluconate Cloth  6 each Topical Daily   famotidine  20 mg Oral BID   ibuprofen  800 mg Oral TID   insulin aspart  0-15 Units Subcutaneous TID WC   insulin aspart  0-5 Units Subcutaneous QHS   insulin glargine-yfgn  10 Units Subcutaneous Daily   lamoTRIgine  150 mg Oral Daily   linezolid  600 mg Oral Q12H   sodium chloride flush  10-40 mL Intracatheter Q12H   sucralfate  1 g Oral TID WC & HS   vancomycin  125 mg Oral QID   Continuous Infusions:  sodium chloride 100 mL/hr at 04/10/21 1800   cefTRIAXone (ROCEPHIN)  IV     PRN Meds: acetaminophen **OR** acetaminophen, metoCLOPramide, ondansetron **OR** ondansetron (ZOFRAN) IV, sodium chloride flush  Allergies:    Allergies  Allergen Reactions   Cephalexin Anaphylaxis    Has gotten ceftriaxone in the past    Penicillins Hives and Rash    Has patient had a PCN reaction causing immediate rash, facial/tongue/throat swelling, SOB or lightheadedness  with hypotension: Yes Has patient had a PCN reaction causing severe rash involving mucus membranes or skin necrosis: No Has patient had a PCN reaction that required hospitalization: Yes Has patient had a PCN reaction occurring within the last 10 years: No Spoke with pt -  childhood hives told by mom, tried no pcns since, doesn't remember reaction herself    Benadryl [Diphenhydramine] Itching   Doxycycline Itching    Social History:   Social History   Socioeconomic History   Marital status: Single    Spouse name: Not on file   Number of children: 0   Years of education: 11th grade   Highest education level: Not on file  Occupational History   Occupation: unemployed    Comment: has never worked  Tobacco Use   Smoking status: Never   Smokeless tobacco: Never  Scientific laboratory technician Use: Never used  Substance and Sexual Activity   Alcohol use: Not Currently   Drug use: No   Sexual activity: Yes    Birth control/protection: None  Other Topics Concern   Not on file  Social History Narrative   Patient lives in [place] mother lives in [place].  Unemployed.  Previously worked for a IT consultant.  Completed 11 grade working on [employer]. Patient 3 brothers    Social Determinants of Radio broadcast assistant Strain: Not on file  Food Insecurity: Not on file  Transportation Needs: Not on file  Physical Activity: Not on file  Stress: Not on file  Social Connections: Not on file  Intimate Partner Violence: Not on file    Family History:   Family History  Problem Relation Age of Onset   Asthma Mother    Carpal tunnel syndrome Mother    Gout Father    Diabetes Paternal Grandmother    Anesthesia problems Neg Hx      ROS:  Please see the history of present illness.  All other ROS reviewed and negative.     Physical Exam/Data:   Vitals:   04/11/21 0443 04/11/21 0500  BP:  (!) 152/109  Temp:  97.8 F (36.6 C)  TempSrc:  Oral  Weight: 67.2 kg   Height: _0  (1.6 m)      Intake/Output Summary (Last 24 hours) at 04/11/2021 1037 Last data filed at 04/11/2021 0551 Gross per 24 hour  Intake 895.17 ml  Output --  Net 895.17 ml   Last 3 Weights 04/11/2021 04/08/2021 04/05/2021  Weight (lbs) 148 lb 2.4 oz 136 lb 11 oz 136 lb 7.4 oz  Weight (kg) 67.2 kg 62 kg 61.9 kg     Body mass index is 26.24 kg/m.  General:  Well nourished, well developed, in no acute distress HEENT: normal Neck: no JVD Vascular: No carotid bruits; Distal pulses 2+ bilaterally Cardiac:  normal S1, S2; RRR; no murmur  Lungs:  clear to auscultation bilaterally, no wheezing, rhonchi or rales, chest wall tenderness noted right mid sternum Abd: soft, nontender, no hepatomegaly  Ext: no edema Musculoskeletal:  No deformities, BUE and BLE strength normal and equal; RUE is not grossly swollen and has good distal pulses Skin: warm and dry  Neuro:  CNs 2-12 intact, no focal abnormalities noted Psych:  Normal affect   EKG:  The EKG was personally reviewed and demonstrates:   12/10 ECG is sinus tach, rate 103, no acute ischemic changes Telemetry:  Telemetry was personally reviewed and demonstrates: Sinus rhythm, sinus tach  Relevant CV Studies:  ECHO: 04/08/2021  1. Left ventricular ejection fraction, by estimation, is 55 to 60%. The  left ventricle has normal function. The left ventricle has no regional  wall motion abnormalities. Left ventricular diastolic parameters were  normal.   2. Right ventricular systolic function is normal. The right ventricular  size is normal. Tricuspid regurgitation signal is inadequate for assessing PA pressure.   3. A small pericardial effusion is present. The pericardial effusion is  circumferential. There is no evidence of cardiac tamponade. (New compared to 04/01/2021 echo)  4. The mitral valve is grossly normal. Trivial mitral valve  regurgitation. No evidence of mitral stenosis.   5. The aortic valve is tricuspid. Aortic valve regurgitation is not   visualized. No aortic stenosis is present.   6. The inferior vena cava is normal in size with greater than 50%  respiratory variability, suggesting right atrial pressure of 3 mmHg.   Laboratory Data:  High Sensitivity Troponin:   Recent Labs  Lab 04/03/21 0017 04/08/21 1514 04/08/21 1702 04/08/21 1957 04/08/21 2156  TROPONINIHS 7 496* 811* 1,071* 1,032*     Chemistry Recent Labs  Lab 04/05/21 0353 04/08/21 0725 04/08/21 0728 04/09/21 0741 04/10/21 2222 04/11/21 0417  NA 135  --    < > 134* 136 137  K 3.6  --    < > 3.7 3.7 3.8  CL 111  --    < > 110 113* 112*  CO2 21*  --    < > 19* 19* 19*  GLUCOSE 60*  --    < > 152* 152* 114*  BUN 16  --    < > 24* 22* 21*  CREATININE 1.36*  --    < > 2.54* 2.11* 1.87*  CALCIUM 7.5*  --    < > 7.8* 7.4* 7.7*  MG 1.7 2.1  --   --   --   --   GFRNONAA 54*  --    < > 26* 32* 37*  ANIONGAP 3*  --    < > 5 4* 6   < > = values in this interval not displayed.    Recent Labs  Lab 04/05/21 0353 04/10/21 2222 04/11/21 0417  PROT 4.3* 4.6* 4.5*  ALBUMIN 1.6* 1.6* 1.6*  AST 16 35 41  ALT _0 ALKPHOS 94 98 109  BILITOT 0.5 0.5 0.3   Lipids No results for input(s): CHOL, TRIG, HDL, LABVLDL, LDLCALC, CHOLHDL in the last 168 hours.  Hematology Recent Labs  Lab 04/09/21 0534 04/09/21 0741 04/10/21 2222 04/11/21 0417  WBC 21.7*  --  10.1 9.2  RBC 2.68* 2.60* 3.19* 3.29*  HGB 7.2* 7.1* 8.8* 9.1*  HCT 22.9* 21.9* 27.3* 27.9*  MCV 85.4  --  85.6 84.8  MCH 26.9  --  27.6 27.7  MCHC 31.4  --  32.2 32.6  RDW 15.6*  --  16.3* 16.1*  PLT 369  --  295 292   Thyroid No results for input(s): TSH, FREET4 in the last 168 hours.  BNPNo results for input(s): BNP, PROBNP in the last 168 hours.  DDimer No results for input(s): DDIMER in the last 168 hours. No results found for: CKTOTAL No results found for: ESRSEDRATE, POCTSEDRATE   Radiology/Studies:  DG Chest 1 View  Result Date: 04/09/2021 CLINICAL DATA:  Central line  placement EXAM: CHEST  1 VIEW COMPARISON:  04/09/2021 FINDINGS: Single frontal view of the chest demonstrates interval placement of a left internal jugular catheter, tip overlying the atriocaval junction. Cardiac silhouette is stable. Bibasilar veiling opacities are unchanged. No pneumothorax. IMPRESSION: 1. Stable bibasilar veiling opacities consistent with consolidation and effusion. 2. No complication after central venous catheter placement. Electronically Signed   By: Randa Ngo M.D.   On: 04/09/2021 16:01   CT HUMERUS RIGHT WO  CONTRAST  Result Date: 04/09/2021 CLINICAL DATA:  Forearm pain, stress fracture suspected, no prior imaging Right upper extremity pain following infiltrated IV; Upper arm pain, stress fracture suspected, no prior imaging Right upper extremity pain following infiltrated IV EXAM: CT OF THE RIGHT FOREARM WITHOUT CONTRAST; CT OF THE RIGHT HUMERUS WITHOUT CONTRAST TECHNIQUE: Multidetector CT imaging was performed according to the standard protocol. Multiplanar CT image reconstructions were also generated. COMPARISON:  None. FINDINGS: Bones/Joint/Cartilage No acute fracture. No dislocation. No significant arthropathy. No bone erosion or periosteal elevation. Small elbow joint effusion, nonspecific. Ligaments Suboptimally assessed by CT. Muscles and Tendons There is diffuse enlargement and heterogeneity of the forearm musculature involving both the flexor and extensor compartments. There are numerous foci of soft tissue gas within both compartments, more pronounced within the flexor compartment of the proximal to mid right forearm. No definite intramuscular fluid collection or extension of soft tissue gas into the upper arm. No gross tendinous abnormality. Soft tissues Marked circumferential soft tissue edema with ill-defined fluid throughout the right upper arm, forearm, and extending to the dorsal aspect of the wrist and hand. No obvious abscess within the subcutaneous soft tissues,  although evaluation is slightly limited in the absence of intravenous contrast. Diffuse anasarca of the visualized right chest and abdominal wall. Moderate sized layering right-sided pleural effusion with patchy airspace opacity in the included aerated right lung. No right axillary lymphadenopathy. IMPRESSION: 1. Diffuse enlargement and heterogeneity of the forearm musculature involving both the flexor and extensor compartments. Numerous foci of soft tissue gas within both compartments, more pronounced within the flexor compartment of the proximal to mid right forearm. Appearance is suggestive of diffuse myositis with infectious pyomyositis or myonecrosis. 2. Marked circumferential soft tissue edema with ill-defined fluid throughout the right upper arm, forearm, and hand, most suggestive of cellulitis. No obvious abscess within the subcutaneous soft tissues, although evaluation is slightly limited in the absence of intravenous contrast. 3. Small elbow joint effusion, nonspecific. Septic arthritis not excluded. 4. No evidence of osteomyelitis. 5. Moderate sized layering right-sided pleural effusion with patchy airspace opacity in the visualized right lung. 6. Diffuse anasarca of the visualized right chest and abdominal wall. These results will be called to the ordering clinician or representative by the Radiologist Assistant, and communication documented in the PACS or [employer]. Electronically Signed   By: Davina Poke D.O.   On: 04/09/2021 16:47   CT FOREARM RIGHT WO CONTRAST  Result Date: 04/09/2021 CLINICAL DATA:  Forearm pain, stress fracture suspected, no prior imaging Right upper extremity pain following infiltrated IV; Upper arm pain, stress fracture suspected, no prior imaging Right upper extremity pain following infiltrated IV EXAM: CT OF THE RIGHT FOREARM WITHOUT CONTRAST; CT OF THE RIGHT HUMERUS WITHOUT CONTRAST TECHNIQUE: Multidetector CT imaging was performed according to the standard  protocol. Multiplanar CT image reconstructions were also generated. COMPARISON:  None. FINDINGS: Bones/Joint/Cartilage No acute fracture. No dislocation. No significant arthropathy. No bone erosion or periosteal elevation. Small elbow joint effusion, nonspecific. Ligaments Suboptimally assessed by CT. Muscles and Tendons There is diffuse enlargement and heterogeneity of the forearm musculature involving both the flexor and extensor compartments. There are numerous foci of soft tissue gas within both compartments, more pronounced within the flexor compartment of the proximal to mid right forearm. No definite intramuscular fluid collection or extension of soft tissue gas into the upper arm. No gross tendinous abnormality. Soft tissues Marked circumferential soft tissue edema with ill-defined fluid throughout the right upper arm, forearm, and extending  to the dorsal aspect of the wrist and hand. No obvious abscess within the subcutaneous soft tissues, although evaluation is slightly limited in the absence of intravenous contrast. Diffuse anasarca of the visualized right chest and abdominal wall. Moderate sized layering right-sided pleural effusion with patchy airspace opacity in the included aerated right lung. No right axillary lymphadenopathy. IMPRESSION: 1. Diffuse enlargement and heterogeneity of the forearm musculature involving both the flexor and extensor compartments. Numerous foci of soft tissue gas within both compartments, more pronounced within the flexor compartment of the proximal to mid right forearm. Appearance is suggestive of diffuse myositis with infectious pyomyositis or myonecrosis. 2. Marked circumferential soft tissue edema with ill-defined fluid throughout the right upper arm, forearm, and hand, most suggestive of cellulitis. No obvious abscess within the subcutaneous soft tissues, although evaluation is slightly limited in the absence of intravenous contrast. 3. Small elbow joint effusion,  nonspecific. Septic arthritis not excluded. 4. No evidence of osteomyelitis. 5. Moderate sized layering right-sided pleural effusion with patchy airspace opacity in the visualized right lung. 6. Diffuse anasarca of the visualized right chest and abdominal wall. These results will be called to the ordering clinician or representative by the Radiologist Assistant, and communication documented in the PACS or [employer]. Electronically Signed   By: Davina Poke D.O.   On: 04/09/2021 16:47   DG Chest Port 1 View  Result Date: 04/09/2021 CLINICAL DATA:  Hyperglycemia, chest pain EXAM: PORTABLE CHEST 1 VIEW COMPARISON:  Prior chest x-ray 04/01/2021 FINDINGS: Patient is rotated to the right. This distorts the cardiac and mediastinal contours. Persistent streaky airspace opacities in the left lung base. New developing linear opacities in right lung base. Additionally, there is new blunting of the right costophrenic angle consistent with a small pleural effusion. Cardiac and mediastinal contours remain grossly unchanged. No pneumothorax. No acute osseous abnormality. IMPRESSION: 1. Patient markedly rotated toward the right. 2. Probable bibasilar atelectasis. Superimposed infiltrate is difficult to exclude. 3. Suspect small bilateral pleural effusions larger on the right than the left. Electronically Signed   By: Jacqulynn Cadet M.D.   On: 04/09/2021 07:47   ECHOCARDIOGRAM COMPLETE  Result Date: 04/09/2021    ECHOCARDIOGRAM REPORT   Patient Name:   NANAKO STOPHER [facility] Date of Exam: 04/09/2021 Medical Rec #:  [id number]                    Height:       63.0 in Accession #:    [id number]                   Weight:       136.7 lb Date of Birth:  10/26/1990                     BSA:          1.645 m Patient Age:    30 years                     BP:           142/76 mmHg Patient Gender: F                            HR:           101 bpm. Exam Location:  Inpatient Procedure: Limited Echo, Cardiac Doppler  and Limited Color Doppler Indications:    Elevated Troponin, limited  History:  Patient has prior history of Echocardiogram examinations, most                 recent 04/01/2021. Signs/Symptoms:Chest Pain and Shortness of                 Breath; Risk Factors:Diabetes.  Sonographer:    Glo Herring Referring Phys: [id number] Zena  1. Left ventricular ejection fraction, by estimation, is 55 to 60%. The left ventricle has normal function. The left ventricle has no regional wall motion abnormalities. Left ventricular diastolic parameters were normal.  2. Right ventricular systolic function is normal. The right ventricular size is normal. Tricuspid regurgitation signal is inadequate for assessing PA pressure.  3. A small pericardial effusion is present. The pericardial effusion is circumferential. There is no evidence of cardiac tamponade.  4. The mitral valve is grossly normal. Trivial mitral valve regurgitation. No evidence of mitral stenosis.  5. The aortic valve is tricuspid. Aortic valve regurgitation is not visualized. No aortic stenosis is present.  6. The inferior vena cava is normal in size with greater than 50% respiratory variability, suggesting right atrial pressure of 3 mmHg. FINDINGS  Left Ventricle: Left ventricular ejection fraction, by estimation, is 55 to 60%. The left ventricle has normal function. The left ventricle has no regional wall motion abnormalities. The left ventricular internal cavity size was normal in size. There is  no left ventricular hypertrophy. Left ventricular diastolic parameters were normal. Right Ventricle: The right ventricular size is normal. No increase in right ventricular wall thickness. Right ventricular systolic function is normal. Tricuspid regurgitation signal is inadequate for assessing PA pressure. Left Atrium: Left atrial size was normal in size. Right Atrium: Right atrial size was normal in size. Pericardium: A small pericardial effusion is  present. The pericardial effusion is circumferential. There is no evidence of cardiac tamponade. Presence of epicardial fat layer. Mitral Valve: The mitral valve is grossly normal. Trivial mitral valve regurgitation. No evidence of mitral valve stenosis. Tricuspid Valve: The tricuspid valve is grossly normal. Tricuspid valve regurgitation is trivial. No evidence of tricuspid stenosis. Aortic Valve: The aortic valve is tricuspid. Aortic valve regurgitation is not visualized. No aortic stenosis is present. Pulmonic Valve: The pulmonic valve was grossly normal. Pulmonic valve regurgitation is trivial. No evidence of pulmonic stenosis. Aorta: The aortic root is normal in size and structure. Venous: The inferior vena cava is normal in size with greater than 50% respiratory variability, suggesting right atrial pressure of 3 mmHg. IAS/Shunts: The atrial septum is grossly normal. Additional Comments: There is a small pleural effusion in the left lateral region.  LEFT VENTRICLE PLAX 2D LVIDd:         4.40 cm Diastology LVIDs:         3.20 cm LV e' medial:    9.03 cm/s LV PW:         0.90 cm LV E/e' medial:  11.1 LV IVS:        0.90 cm LV e' lateral:   10.80 cm/s                        LV E/e' lateral: 9.2  IVC IVC diam: 1.20 cm LEFT ATRIUM         Index LA diam:    3.00 cm 1.82 cm/m  MITRAL VALVE MV Area (PHT): 5.23 cm MV Decel Time: 145 msec MV E velocity: 99.80 cm/s MV A velocity: 94.70 cm/s MV E/A ratio:  1.05 [place]  O'Neal MD Electronically signed by Eleonore Chiquito MD Signature Date/Time: 04/09/2021/1:08:51 PM    Final    [place] EKG SITE RITE  Result Date: 04/08/2021 If Site Rite image not attached, placement could not be confirmed due to current cardiac rhythm.    Assessment and Plan:   Chest pain: -Cardiac enzymes were elevated during her previous admission, suspect demand ischemia in setting of severe acidosis on admission -Echo was unremarkable -She was started on ibuprofen 800 mg TID due to concern for  myopericarditis.  Less suspicion for myocarditis given echo with normal EF and there are no EKG changes to suggest pericarditis, and description of chest pain suggests more likely MSK pain, as she has significant tenderness to palpation.  Would recommend discontinuing ibuprofen given her renal function  2. DM -In 2018, during 1 admission, her blood sugars were almost thousand, so she has struggled with glucose control for a long time - Hemoglobin A1c as high as 19.3 in 2015, got down to 6.4 in 2021. -However, it went back up to 13 in 11/2020 and last month was 10.5. - Barriers to good glucose control are unclear, she has problems complying with medications.  There may be financial issues as well. - Per IM  3.  DKA and other issues, per IM   Risk Assessment/Risk Scores:     HEAR Score (for undifferentiated chest pain):  HEAR Score: 1      For questions or updates, please contact [place] Please consult [email or web address] for contact info under    Signed, Rosaria Ferries, PA-C  04/11/2021 10:37 AM  Patient seen and examined.  Agree with above documentation.  [name] is a 30 year old female with T1DM, CKD, hypertension who we are consulted for evaluation of chest pain/troponin elevation.  She had recent admission for sepsis due to C. difficile infection, was discharged on p.o. vancomycin.  She was subsequently readmitted on 12/10 after running out of her insulin and presenting with DKA.  In the ED, noted to have glucose 1191, bicarb less than 7, pH 7.044.  She was admitted to stepdown unit and started on insulin drip.  She reported atypical chest pain and troponin was found to be elevated (496 > 811 > 1071 > 1032).  She was seen by cardiology on 12/11, started on high-dose ibuprofen and recommended  echocardiogram.  Echocardiogram on 04/09/2021 showed EF 55 to 60%, no regional wall motion abnormalities, normal RV function, small pericardial effusion, no significant valvular  disease.  EKG shows sinus tachycardia, rate 113, low voltage, QTC 491.  While she was receiving a blood transfusion on 12/11 the IV became infiltrated and she experienced severe pain in her right forearm.  There was concern for compartment syndrome and she was transferred to [facility] on 12/11 for surgical evaluation.  She was evaluated by surgery at [facility], who felt no surgical intervention needed and transferred back to [facility].  On exam, patient is alert and oriented, tachycardic, regular rhythm, no murmurs, lungs CTAB, 1+ LE edema.   For her chest pain/troponin elevation, she was started on ibuprofen 800 mg TID due to concern for myopericarditis.  Less suspicion for myocarditis given echo with normal EF and there are no EKG changes to suggest pericarditis, and description of chest pain suggests more likely MSK pain, as she has significant tenderness to palpation.  Would recommend discontinuing ibuprofen given her renal function.  Suspect troponin elevation represents demand ischemia in setting of severe acidosis on admission.  Donato Heinz, MD

## 2021-04-11 NOTE — TOC Progression Note (Signed)
Transition of Care [facility]) - Progression Note    Patient Details  Name: Ashley Freeman MRN: [id number] Date of Birth: [birth year]  Transition of Care [facility]) CM/SW Contact  Purcell Mouton, RN Phone Number: 04/11/2021, 10:09 AM  Clinical Narrative:     Pt from home with parents. TOC will continue to follow.  Expected Discharge Plan: Home/Self Care Barriers to Discharge: No Barriers Identified  Expected Discharge Plan and Services Expected Discharge Plan: Home/Self Care       Living arrangements for the past 2 months: Single Family Home                                       Social Determinants of Health (SDOH) Interventions    Readmission Risk Interventions No flowsheet data found.

## 2021-04-12 DIAGNOSIS — R079 Chest pain, unspecified: Secondary | ICD-10-CM

## 2021-04-12 DIAGNOSIS — I248 Other forms of acute ischemic heart disease: Secondary | ICD-10-CM | POA: Diagnosis not present

## 2021-04-12 LAB — BASIC METABOLIC PANEL
Anion gap: 6 (ref 5–15)
BUN: 18 mg/dL (ref 6–20)
CO2: 17 mmol/L — ABNORMAL LOW (ref 22–32)
Calcium: 7.5 mg/dL — ABNORMAL LOW (ref 8.9–10.3)
Chloride: 113 mmol/L — ABNORMAL HIGH (ref 98–111)
Creatinine, Ser: 1.79 mg/dL — ABNORMAL HIGH (ref 0.44–1.00)
GFR, Estimated: 39 mL/min — ABNORMAL LOW (ref >60)
Glucose, Bld: 88 mg/dL (ref 70–99)
Potassium: 3.8 mmol/L (ref 3.5–5.1)
Sodium: 136 mmol/L (ref 135–145)

## 2021-04-12 LAB — GLUCOSE, CAPILLARY
Glucose-Capillary: 104 mg/dL — ABNORMAL HIGH (ref 70–99)
Glucose-Capillary: 146 mg/dL — ABNORMAL HIGH (ref 70–99)
Glucose-Capillary: 279 mg/dL — ABNORMAL HIGH (ref 70–99)
Glucose-Capillary: 131 mg/dL — ABNORMAL HIGH (ref 70–99)
Glucose-Capillary: 80 mg/dL (ref 70–99)

## 2021-04-12 LAB — TROPONIN I (HIGH SENSITIVITY): Troponin I (High Sensitivity): 170 ng/L (ref <18)

## 2021-04-12 LAB — SEDIMENTATION RATE: Sed Rate: 72 mm/hr — ABNORMAL HIGH (ref 0–22)

## 2021-04-12 LAB — C-REACTIVE PROTEIN: CRP: 1.1 mg/dL — ABNORMAL HIGH (ref <1.0)

## 2021-04-12 MED ORDER — SODIUM BICARBONATE 650 MG PO TABS
1300.0000 mg | ORAL_TABLET | Freq: Four times a day (QID) | ORAL | Status: DC
  Administered 2021-04-12 – 2021-04-14 (×6): 1300 mg via ORAL
  Filled 2021-04-12 (×6): qty 2

## 2021-04-12 MED ORDER — METHOCARBAMOL 500 MG PO TABS
500.0000 mg | ORAL_TABLET | Freq: Four times a day (QID) | ORAL | Status: DC | PRN
  Administered 2021-04-12 – 2021-04-13 (×3): 500 mg via ORAL
  Filled 2021-04-12 (×3): qty 1

## 2021-04-12 MED ORDER — LIP MEDEX EX OINT
TOPICAL_OINTMENT | CUTANEOUS | Status: DC | PRN
  Filled 2021-04-12: qty 7

## 2021-04-12 MED ORDER — DICLOFENAC SODIUM 1 % EX GEL
4.0000 g | Freq: Four times a day (QID) | CUTANEOUS | Status: DC
  Administered 2021-04-12 (×2): 4 g via TOPICAL
  Filled 2021-04-12: qty 100

## 2021-04-12 NOTE — Progress Notes (Addendum)
PROGRESS NOTE    Ashley Freeman  VOZ:[id number] DOB: [birth year] DOA: 04/10/2021 PCP: [name], PA-C    Brief Narrative:  Ashley Freeman is a 30 year old female with past medical history significant for type 1 diabetes mellitus, CKD stage IIIa, GERD, essential hypertension, gastroparesis, medical noncompliance who presented to [facility] ED on 12/10 via EMS complaining of diffuse pain.  On EMS arrival, patient states her insulin pump had failed requiring her to take injections.  Patient complaining of diffuse body aches, nausea/vomiting, diarrhea over the last few days.  Did not try any medications for symptoms at home.  Given symptoms did not resolve, EMS was activated.  In the ED, temperature 97.6 F, HR 114, RR 21, BP 96/55, SPO2 100% on room air.  Sodium 126, potassium 5.9, chloride 98, CO2 less than 7, glucose 1191 and an anion gap that was on unable to be calculated.  VBG with pH 7.044.  WBC 20.3, hemoglobin 7.6, platelets 374, MCV 102.1.  Beta hydroxybutyrate acid greater than 8.00.  hCG negative.  COVID-19 PCR negative peer influenza A/B PCR negative.  High sensitive troponin 496.  PCCM was consulted, reviewed and felt that she was appropriate in the stepdown unit on the medicine service.  Cardiology consulted for elevated troponin.  Hospital service was consulted for further evaluation and management.  She was transferred to [facility] for evaluation w/ the hand surgery team. After their evaluation, they did not see concern for compartment syndrome or necrotizing injury. They recommended elevation of arm and warm compresses. She was returned to [facility] for care on 04/10/2021.    Hospital course complicated by transient reproducible chest pain, thought to be musculoskeletal.  04/12/2021: Seen and examined at bedside.  Denies having any chest pain abdominal publicity.  She reports back pain worse in the mid and lower back, not improved with Tylenol.   Assessment & Plan:    Active Problems:   Chest pain of uncertain etiology   DKA (diabetic ketoacidosis) ([place])   Demand ischemia ([place])   Right forearm necrotizing infection with myositis/necrosis concerning for compartment syndrome While receiving blood transfusion this morning, patient's IV infiltrated with patient experiencing severe pain to her right forearm.  Patient was having paresthesias to her fingers with decreased finger flexion/extension and significant pain on palpation of the anterior forearm.  Radial and ulnar pulses were intact.  CT right forearm/humerus without contrast notable for diffuse enlargement/heterogenicity forearm musculature both flexor/extensor compartments with numerous foci of soft tissue gas, suggestive of diffuse myositis with infectious Pyo myositis or myonecrosis with circumferential soft tissue edema and ill-defined fluid throughout right upper arm/forearm/hand suggestive of cellulitis.  No abscess identified but noncontrasted study.  Discussed with orthopedics on-call, Dr. Lynann Bologna who recommended transfer to [facility] for hand surgery evaluation as there is no hand surgeon on-call in [place] currently. --Checking CK level -- Blood cultures negative to date Received IV Zyvox and meropenem Antibiotics switched to Rocephin and linezolid continued on 04/11/2021.  Diabetic ketoacidosis Hx type 1 diabetes mellitus, poorly controlled Patient representing to the ED following recent discharge with diffuse body aches, difficulty with her insulin pump at home and was found to have an elevated glucose greater than 1000 with elevated beta hydroxybutyrate acid.  Etiology likely secondary to noncompliance given her history and multiple hospitalizations for same.  Hemoglobin A1c 10.5 on 03/07/2021.  Initially started on insulin drip with resolution of DKA with normalization of anion gap and beta hydroxybutyrate acid. --Diabetic educator following --Transition insulin drip to [facility]  10 units  subcutaneously daily --NovoLog 2u TIDAC if eating >50% meals --SSI for coverage --CBGs qAC/HS continue gentle IV fluid hydration. Tolerating a regular consistency diet.  Resolved chest pain Elevated troponin suspect demand ischemia in the setting of DKA with pH of 7.0 on presentation. Patient presenting with diffuse body aches/pain.  Initial troponin elevated at 496.  EKG with sinus tachycardia, rate 113, QTc 491, no concerning dynamic changes.  Unclear etiology. --Cardiology following, appreciate assistance --hs Troponin 496>811>1071>1032 --TTE: LVEF 55-60%, no LV RWMA, small circumfrential pericardial effusion, IVC normal --Ibuprofen 800 mg 3 times daily with Carafate/Pepcid --Cardiology recommends stopping heparin drip as this is not ACS, consideration of possible myocarditis with consideration of follow-up with cardiac MRI. --Continue to monitor on telemetry Reports reproducible chest pain on 04/11/2021 not improved with ibuprofen or IV Toradol. Seen by cardiology Will obtain twelve-lead EKG to further assess 1 dose of IV Dilaudid 0.5 mg ordered on 04/11/2021 Troponins are downtrending  Symptomatic anemia Hemoglobin on admission 7.6.  Recently discharged was 7.7.  Overnight dropped to 6.7.  Unclear etiology.  Anemia panel with iron 45, TIBC 123, ferritin 340, vitamin B12 935, folate 6.5. --Hgb 7.7>>6.7; transfusing 2 unit PRBCs --Peripheral smear --CBC daily; goal hemoglobin > 8.0 given chest pain  Resolved leukocytosis Recent C. difficile colitis infection Recently diagnosed with C. difficile colitis, discharged on oral vancomycin.  WBC count elevated 20.3 on admission. Continue p.o. vancomycin while on antibiotics for other infections.  Gastroparesis --Reglan 5 mg IV q8h scheduled --Zofran 4 mg IV every 8 hours as needed nausea/vomiting Diet has been advanced.  Acute renal failure on CKD stage IIIa Patient presenting with a creatinine of 2.78.  On recent discharge  04/05/2021 was 1.36.  Etiology likely secondary to dehydration from DKA as above. --Cr 2.78>>2.46> 1.79. IV fluids stopped on 04/12/2021 to avoid volume overload. She is tolerating oral intake. Continue to avoid nephrotoxins, renal dose all medications.  Worsening Non anion gap metabolic acidosis, improving, secondary to renal insufficiency. Start p.o. sodium bicarb. Repeat BMP in the morning.  Unspecified back pain Mid to lower back pain Start Voltaren gel topical NSAID Robaxin p.o. as needed Monitor  DVT prophylaxis: SCDs Start: 04/10/21 1549SCDs   Code Status: Full Code Family Communication: No family present at bedside this morning.  Disposition Plan:  Level of care: Progressive Status is: Inpatient  Remains inpatient appropriate because: Remains on insulin drip, on IV fluids, continues with nausea/vomiting, on heparin drip for NSTEMI    Consultants:  PCCM Cardiology  Procedures:  TTE  Antimicrobials:  Metronidazole 12/10>>   Objective: Vitals:   04/11/21 0443 04/11/21 0500 04/11/21 1502  BP:  (!) 152/109 (!) 140/92  Pulse:   (!) 110  Resp:   18  Temp:  97.8 F (36.6 C) 98.4 F (36.9 C)  TempSrc:  Oral Oral  SpO2:   97%  Weight: 67.2 kg    Height: 5\' 3"  (1.6 m)      Intake/Output Summary (Last 24 hours) at 04/12/2021 1420 Last data filed at 04/12/2021 0900 Gross per 24 hour  Intake 1760.41 ml  Output --  Net 1760.41 ml   Filed Weights   04/11/21 0443  Weight: 67.2 kg    Examination:  General exam: Well-developed well-nourished in no acute distress patient is alert oriented x3. Respiratory system: Clear to auscultation no wheezes or rales.   Cardiovascular system: Regular rate and rhythm no rubs or gallops. Gastrointestinal system: Soft nontender bowel sounds present.  Nondistended.   Central nervous  system: Alert and oriented x4.  Moves all 4 extremities equally.  Nonfocal.   Extremities: Dependent edema in upper and lower extremities  bilaterally. Skin: No rashes or ulcerative lesions noted. Psychiatry: Mood is appropriate for condition and setting.   Data Reviewed: I have personally reviewed following labs and imaging studies  CBC: Recent Labs  Lab 04/08/21 0728 04/08/21 1957 04/09/21 0139 04/09/21 0534 04/09/21 0741 04/10/21 2222 04/11/21 0417  WBC 20.3*  --   --  21.7*  --  10.1 9.2  NEUTROABS 13.1*  --   --   --   --  7.0  --   HGB 7.6*   < > 6.7* 7.2* 7.1* 8.8* 9.1*  HCT 28.8*   < > 21.4* 22.9* 21.9* 27.3* 27.9*  MCV 102.1*  --   --  85.4  --  85.6 84.8  PLT 371  --   --  369  --  295 292   < > = values in this interval not displayed.   Basic Metabolic Panel: Recent Labs  Lab 04/08/21 0725 04/08/21 0728 04/09/21 0534 04/09/21 0741 04/10/21 2222 04/11/21 0417 04/12/21 0445  NA  --    < > 135 134* 136 137 136  K  --    < > 3.6 3.7 3.7 3.8 3.8  CL  --    < > 110 110 113* 112* 113*  CO2  --    < > 17* 19* 19* 19* 17*  GLUCOSE  --    < > 130* 152* 152* 114* 88  BUN  --    < > 25* 24* 22* 21* 18  CREATININE  --    < > 2.46* 2.54* 2.11* 1.87* 1.79*  CALCIUM  --    < > 7.9* 7.8* 7.4* 7.7* 7.5*  MG 2.1  --   --   --   --   --   --    < > = values in this interval not displayed.   GFR: Estimated Creatinine Clearance: 43.1 mL/min (A) (by C-G formula based on SCr of 1.79 mg/dL (H)). Liver Function Tests: Recent Labs  Lab 04/10/21 2222 04/11/21 0417  AST 35 41  ALT 19 19  ALKPHOS 98 109  BILITOT 0.5 0.3  PROT 4.6* 4.5*  ALBUMIN 1.6* 1.6*   No results for input(s): LIPASE, AMYLASE in the last 168 hours. No results for input(s): AMMONIA in the last 168 hours. Coagulation Profile: Recent Labs  Lab 04/08/21 1957  INR 1.2   Cardiac Enzymes: No results for input(s): CKTOTAL, CKMB, CKMBINDEX, TROPONINI in the last 168 hours. BNP (last 3 results) No results for input(s): PROBNP in the last 8760 hours. HbA1C: No results for input(s): HGBA1C in the last 72 hours. CBG: Recent Labs  Lab  04/11/21 1327 04/11/21 1704 04/11/21 2046 04/12/21 0750 04/12/21 1204  GLUCAP 191* 70 104* 146* 279*   Lipid Profile: No results for input(s): CHOL, HDL, LDLCALC, TRIG, CHOLHDL, LDLDIRECT in the last 72 hours. Thyroid Function Tests: No results for input(s): TSH, T4TOTAL, FREET4, T3FREE, THYROIDAB in the last 72 hours. Anemia Panel: No results for input(s): VITAMINB12, FOLATE, FERRITIN, TIBC, IRON, RETICCTPCT in the last 72 hours.  Sepsis Labs: No results for input(s): PROCALCITON, LATICACIDVEN in the last 168 hours.  Recent Results (from the past 240 hour(s))  Resp Panel by RT-PCR (Flu A&B, Covid) Nasopharyngeal Swab     Status: None   Collection Time: 04/08/21  7:20 AM   Specimen: Nasopharyngeal Swab; Nasopharyngeal(NP) swabs in vial  transport medium  Result Value Ref Range Status   SARS Coronavirus 2 by RT PCR NEGATIVE NEGATIVE Final    Comment: (NOTE) SARS-CoV-2 target nucleic acids are NOT DETECTED.  The SARS-CoV-2 RNA is generally detectable in upper respiratory specimens during the acute phase of infection. The lowest concentration of SARS-CoV-2 viral copies this assay can detect is 138 copies/mL. A negative result does not preclude SARS-Cov-2 infection and should not be used as the sole basis for treatment or other patient management decisions. A negative result may occur with  improper specimen collection/handling, submission of specimen other than nasopharyngeal swab, presence of viral mutation(s) within the areas targeted by this assay, and inadequate number of viral copies(<138 copies/mL). A negative result must be combined with clinical observations, patient history, and epidemiological information. The expected result is Negative.  Fact Sheet for Patients:  EntrepreneurPulse.com.au  Fact Sheet for Healthcare Providers:  IncredibleEmployment.be  This test is no t yet approved or cleared by the [place] FDA and  has  been authorized for detection and/or diagnosis of SARS-CoV-2 by FDA under an Emergency Use Authorization (EUA). This EUA will remain  in effect (meaning this test can be used) for the duration of the COVID-19 declaration under Section 564(b)(1) of the Act, 21 U.S.C.section 360bbb-3(b)(1), unless the authorization is terminated  or revoked sooner.       Influenza A by PCR NEGATIVE NEGATIVE Final   Influenza B by PCR NEGATIVE NEGATIVE Final    Comment: (NOTE) The Xpert Xpress SARS-CoV-2/FLU/RSV plus assay is intended as an aid in the diagnosis of influenza from Nasopharyngeal swab specimens and should not be used as a sole basis for treatment. Nasal washings and aspirates are unacceptable for Xpert Xpress SARS-CoV-2/FLU/RSV testing.  Fact Sheet for Patients: EntrepreneurPulse.com.au  Fact Sheet for Healthcare Providers: IncredibleEmployment.be  This test is not yet approved or cleared by the [place] FDA and has been authorized for detection and/or diagnosis of SARS-CoV-2 by FDA under an Emergency Use Authorization (EUA). This EUA will remain in effect (meaning this test can be used) for the duration of the COVID-19 declaration under Section 564(b)(1) of the Act, 21 U.S.C. section 360bbb-3(b)(1), unless the authorization is terminated or revoked.  Performed at [facility], [place] [address], [place], [place] [address]          Radiology Studies: No results found.      Scheduled Meds:  amLODipine  10 mg Oral Daily   Chlorhexidine Gluconate Cloth  6 each Topical Daily   diclofenac Sodium  4 g Topical QID   famotidine  20 mg Oral BID   insulin aspart  0-15 Units Subcutaneous TID WC   insulin aspart  0-5 Units Subcutaneous QHS   insulin glargine-yfgn  10 Units Subcutaneous Daily   lamoTRIgine  150 mg Oral Daily   linezolid  600 mg Oral Q12H   sodium chloride flush  10-40 mL Intracatheter Q12H   sucralfate  1  g Oral TID WC & HS   vancomycin  125 mg Oral QID   Continuous Infusions:  cefTRIAXone (ROCEPHIN)  IV 2 g (04/11/21 1541)     LOS: 2 days    Critical Care Time Upon my evaluation, this patient had a high probability of imminent or life-threatening deterioration due to DKA requiring insulin drip, NSTEMI, which required my direct attention, intervention, and personal management.  I have personally provided 49 minutes of critical care time exclusive of my time spent on separately billable procedures.  Time includes  review of laboratory data, radiology results, discussion with consultants, and monitoring for potential decompensation.       Kayleen Memos, DO [facility] Available via Epic secure chat 7am-7pm After these hours, please refer to coverage provider listed on [email or web address] 04/12/2021, 2:20 PM

## 2021-04-12 NOTE — Progress Notes (Addendum)
Progress Note  Patient Name: Ashley Freeman Date of Encounter: 04/12/2021  [facility] Cardiologist: Jenkins Rouge, MD   Subjective   Feeling better, CP finally has really improved Wants to talk to S.W.  Inpatient Medications    Scheduled Meds:  amLODipine  10 mg Oral Daily   Chlorhexidine Gluconate Cloth  6 each Topical Daily   famotidine  20 mg Oral BID   insulin aspart  0-15 Units Subcutaneous TID WC   insulin aspart  0-5 Units Subcutaneous QHS   insulin glargine-yfgn  10 Units Subcutaneous Daily   lamoTRIgine  150 mg Oral Daily   linezolid  600 mg Oral Q12H   sodium chloride flush  10-40 mL Intracatheter Q12H   sucralfate  1 g Oral TID WC & HS   vancomycin  125 mg Oral QID   Continuous Infusions:  sodium chloride 50 mL/hr at 04/11/21 2009   cefTRIAXone (ROCEPHIN)  IV 2 g (04/11/21 1541)   PRN Meds: acetaminophen **OR** acetaminophen, ondansetron **OR** ondansetron (ZOFRAN) IV, sodium chloride flush   Vital Signs    Vitals:   04/11/21 0443 04/11/21 0500 04/11/21 1502  BP:  (!) 152/109 (!) 140/92  Pulse:   (!) 110  Resp:   18  Temp:  97.8 F (36.6 C) 98.4 F (36.9 C)  TempSrc:  Oral Oral  SpO2:   97%  Weight: 67.2 kg    Height: 5\' 3"  (1.6 m)      Intake/Output Summary (Last 24 hours) at 04/12/2021 1006 Last data filed at 04/12/2021 0900 Gross per 24 hour  Intake 1760.41 ml  Output --  Net 1760.41 ml   Last 3 Weights 04/11/2021 04/08/2021 04/05/2021  Weight (lbs) 148 lb 2.4 oz 136 lb 11 oz 136 lb 7.4 oz  Weight (kg) 67.2 kg 62 kg 61.9 kg      Telemetry    SR, mostly ST - Personally Reviewed  ECG    ST, rate 109 - Personally Reviewed  Physical Exam   GEN: No acute distress.   Neck: No JVD Cardiac: RRR, no murmurs, rubs, or gallops.  Respiratory: Clear to auscultation bilaterally. GI: Soft, nontender, non-distended  MS: No edema; No deformity. Neuro:  Nonfocal  Psych: Normal affect   Labs    High Sensitivity  Troponin:   Recent Labs  Lab 04/08/21 1514 04/08/21 1702 04/08/21 1957 04/08/21 2156 04/12/21 0446  TROPONINIHS 496* 811* 1,071* 1,032* 170*     Chemistry Recent Labs  Lab 04/08/21 0725 04/08/21 0728 04/09/21 0741 04/10/21 2222 04/11/21 0417  NA  --    < > 134* 136 137  K  --    < > 3.7 3.7 3.8  CL  --    < > 110 113* 112*  CO2  --    < > 19* 19* 19*  GLUCOSE  --    < > 152* 152* 114*  BUN  --    < > 24* 22* 21*  CREATININE  --    < > 2.54* 2.11* 1.87*  CALCIUM  --    < > 7.8* 7.4* 7.7*  MG 2.1  --   --   --   --   PROT  --   --   --  4.6* 4.5*  ALBUMIN  --   --   --  1.6* 1.6*  AST  --   --   --  35 41  ALT  --   --   --  19 19  ALKPHOS  --   --   --  98 109  BILITOT  --   --   --  0.5 0.3  GFRNONAA  --    < > 26* 32* 37*  ANIONGAP  --    < > 5 4* 6   < > = values in this interval not displayed.    Lipids No results for input(s): CHOL, TRIG, HDL, LABVLDL, LDLCALC, CHOLHDL in the last 168 hours.  Hematology Recent Labs  Lab 04/09/21 0534 04/09/21 0741 04/10/21 2222 04/11/21 0417  WBC 21.7*  --  10.1 9.2  RBC 2.68* 2.60* 3.19* 3.29*  HGB 7.2* 7.1* 8.8* 9.1*  HCT 22.9* 21.9* 27.3* 27.9*  MCV 85.4  --  85.6 84.8  MCH 26.9  --  27.6 27.7  MCHC 31.4  --  32.2 32.6  RDW 15.6*  --  16.3* 16.1*  PLT 369  --  295 292   Thyroid No results for input(s): TSH, FREET4 in the last 168 hours.  BNPNo results for input(s): BNP, PROBNP in the last 168 hours.  DDimer No results for input(s): DDIMER in the last 168 hours.  Lab Results  Component Value Date   ESRSEDRATE 72 (H) 04/12/2021     Radiology    No results found.  Cardiac Studies   ECHO: 04/08/2021  1. Left ventricular ejection fraction, by estimation, is 55 to 60%. The  left ventricle has normal function. The left ventricle has no regional  wall motion abnormalities. Left ventricular diastolic parameters were  normal.   2. Right ventricular systolic function is normal. The right ventricular  size is  normal. Tricuspid regurgitation signal is inadequate for assessing PA pressure.   3. A small pericardial effusion is present. The pericardial effusion is  circumferential. There is no evidence of cardiac tamponade. (New compared to 04/01/2021 echo)  4. The mitral valve is grossly normal. Trivial mitral valve  regurgitation. No evidence of mitral stenosis.   5. The aortic valve is tricuspid. Aortic valve regurgitation is not  visualized. No aortic stenosis is present.   6. The inferior vena cava is normal in size with greater than 50%  respiratory variability, suggesting right atrial pressure of 3 mmHg.     Patient Profile     30 y.o. female with a hx of DMI w/ poor control, HLD, anemia since 03/2019, DKA, AKI, was admitted 12/10 w/ DKA.  Cards saw 04/11/2021 for the evaluation of chest pain.  Assessment & Plan    Chest pain - trop 1,071, suspect demand ischemia in setting of DKA and PH 7.0 on presentation - EF nl on echo w/ no WMA - no further cardiac eval indicated - suspect MSK pain, reproduced with palpation   [facility] will sign off.   Medication Recommendations:  None Other recommendations (labs, testing, etc):  None Follow up as an outpatient:  Will schedule   For questions or updates, please contact [place] Please consult [email or web address] for contact info under       Signed, Rosaria Ferries, PA-C  04/12/2021, 10:06 AM     Patient seen and examined.  Agree with above documentation.  On exam, patient is alert and oriented, tachycardic, regular, no murmurs, lungs CTAB, trace LE edema.  Suspect troponin elevation represents demand ischemia in setting of severe acidosis on admission.  Normal EF on echo.   Suspect her chest pain is musculoskeletal pain, reproduced with palpation.  No further cardiac work-up recommended at this time.   Donato Heinz, MD

## 2021-04-13 ENCOUNTER — Inpatient Hospital Stay (HOSPITAL_COMMUNITY): Payer: Medicaid Other

## 2021-04-13 DIAGNOSIS — R079 Chest pain, unspecified: Secondary | ICD-10-CM | POA: Diagnosis not present

## 2021-04-13 LAB — GLUCOSE, CAPILLARY
Glucose-Capillary: 280 mg/dL — ABNORMAL HIGH (ref 70–99)
Glucose-Capillary: 218 mg/dL — ABNORMAL HIGH (ref 70–99)
Glucose-Capillary: 118 mg/dL — ABNORMAL HIGH (ref 70–99)
Glucose-Capillary: 147 mg/dL — ABNORMAL HIGH (ref 70–99)

## 2021-04-13 LAB — PHOSPHORUS: Phosphorus: 2.7 mg/dL (ref 2.5–4.6)

## 2021-04-13 LAB — BASIC METABOLIC PANEL
Anion gap: 5 (ref 5–15)
BUN: 18 mg/dL (ref 6–20)
CO2: 18 mmol/L — ABNORMAL LOW (ref 22–32)
Calcium: 7.4 mg/dL — ABNORMAL LOW (ref 8.9–10.3)
Chloride: 114 mmol/L — ABNORMAL HIGH (ref 98–111)
Creatinine, Ser: 1.81 mg/dL — ABNORMAL HIGH (ref 0.44–1.00)
GFR, Estimated: 39 mL/min — ABNORMAL LOW (ref >60)
Glucose, Bld: 215 mg/dL — ABNORMAL HIGH (ref 70–99)
Potassium: 3.6 mmol/L (ref 3.5–5.1)
Sodium: 137 mmol/L (ref 135–145)

## 2021-04-13 LAB — BRAIN NATRIURETIC PEPTIDE: B Natriuretic Peptide: 963 pg/mL — ABNORMAL HIGH (ref 0.0–100.0)

## 2021-04-13 MED ORDER — CARBAMIDE PEROXIDE 6.5 % OT SOLN
5.0000 [drp] | Freq: Two times a day (BID) | OTIC | Status: DC
  Administered 2021-04-13 – 2021-04-14 (×3): 5 [drp] via OTIC
  Filled 2021-04-13: qty 15

## 2021-04-13 MED ORDER — SENNOSIDES-DOCUSATE SODIUM 8.6-50 MG PO TABS
2.0000 | ORAL_TABLET | Freq: Every day | ORAL | Status: DC
  Filled 2021-04-13: qty 2

## 2021-04-13 MED ORDER — FUROSEMIDE 10 MG/ML IJ SOLN
20.0000 mg | Freq: Two times a day (BID) | INTRAMUSCULAR | Status: DC
  Administered 2021-04-13 – 2021-04-14 (×2): 20 mg via INTRAVENOUS
  Filled 2021-04-13 (×2): qty 2

## 2021-04-13 MED ORDER — OXYCODONE HCL 5 MG PO TABS
5.0000 mg | ORAL_TABLET | Freq: Four times a day (QID) | ORAL | Status: DC | PRN
  Administered 2021-04-13 – 2021-04-14 (×3): 5 mg via ORAL
  Filled 2021-04-13 (×3): qty 1

## 2021-04-13 MED ORDER — HYDROMORPHONE HCL 1 MG/ML IJ SOLN: 0.5000 mg | INTRAMUSCULAR | Status: DC | PRN

## 2021-04-13 NOTE — Progress Notes (Signed)
PROGRESS NOTE    Ashley Freeman  ZOX:[id number] DOB: February 18, 1991 DOA: 04/10/2021 PCP: System, Provider Not In    Brief Narrative:  Ashley Freeman is a 30 year old female with past medical history significant for type 1 diabetes mellitus, CKD stage IIIa, GERD, essential hypertension, gastroparesis, medical noncompliance who presented to [facility] ED on 12/10 via EMS complaining of diffuse pain.  On EMS arrival, patient states her insulin pump had failed requiring her to take injections.  Patient complaining of diffuse body aches, nausea/vomiting, diarrhea over the last few days.  Did not try any medications for symptoms at home.  Given symptoms did not resolve, EMS was activated.  Work-up revealed severe DKA and elevated troponin.  Also revealed right upper extremity cellulitis for which she was started on IV antibiotics.  Seen by cardiology for elevated troponin thought secondary to demand ischemia in the setting of severe DKA.  She was transferred to [facility] for evaluation w/ the hand surgery team. After their evaluation, they did not see concern for compartment syndrome or necrotizing injury. They recommended elevation of arm and warm compresses. She was returned to [facility] for care on 04/10/2021.    Hospital course complicated by transient reproducible chest pain which resolved, thought to be musculoskeletal.  Also complicated by anasarca and lower back pain.  Lumbar spine x-ray ordered.  Started on IV Lasix 20 mg twice daily with close monitoring of renal function.  04/13/21: Patient was seen and examined at bedside.  She reports 8 out of 10 lower back pain and swelling in the lower extremity and upper extremities bilaterally.  Started on IV diuretics with close monitoring of renal function.\   Assessment & Plan:   Active Problems:   Chest pain of uncertain etiology   DKA (diabetic ketoacidosis) ([place])   Demand ischemia (HCC)   Right forearm cellulitis, POA Ruled out  necrotizing infection with myositis/necrosis concerning for compartment syndrome While receiving blood transfusion this morning, patient's IV infiltrated with patient experiencing severe pain to her right forearm.  Patient was having paresthesias to her fingers with decreased finger flexion/extension and significant pain on palpation of the anterior forearm.  Radial and ulnar pulses were intact.  CT right forearm/humerus without contrast notable for diffuse enlargement/heterogenicity forearm musculature both flexor/extensor compartments with numerous foci of soft tissue gas, suggestive of diffuse myositis with infectious Pyomyositis or myonecrosis with circumferential soft tissue edema and ill-defined fluid throughout right upper arm/forearm/hand suggestive of cellulitis.  No abscess identified but noncontrasted study.  Dr. [place] discussed with orthopedics on-call, Dr. Lynann Bologna who recommended transfer to [facility] for hand surgery evaluation as there is no hand surgeon on-call in [place] currently. -- Blood cultures negative to date Received IV Zyvox and meropenem Antibiotics switched to Rocephin and linezolid on 04/11/2021. Currently afebrile with no leukocytosis  Diabetic ketoacidosis Hx type 1 diabetes mellitus, poorly controlled Patient representing to the ED following recent discharge with diffuse body aches, difficulty with her insulin pump at home and was found to have an elevated glucose greater than 1000 with elevated beta hydroxybutyrate acid.  pH of 7.0.  Etiology likely secondary to noncompliance given her history and multiple hospitalizations for same.  Hemoglobin A1c 10.5 on 03/07/2021.  Initially started on insulin drip with resolution of DKA with normalization of anion gap and beta hydroxybutyrate acid. --Diabetic educator following --Transitioned insulin drip to Semglee 10 units subcutaneously daily --NovoLog 2u TIDAC if eating >50% meals --SSI for coverage --CBGs qAC/HS  continue gentle IV fluid hydration. Tolerating carb  modified diet.  Resolved chest pain Elevated troponin suspect demand ischemia in the setting of severe DKA type I with pH of 7.0 on presentation. Patient presenting with diffuse body aches/pain.  Initial troponin elevated at 496.  EKG with sinus tachycardia, rate 113, QTc 491, no concerning dynamic changes.   Seen by cardiology, signed off. --hs Troponin 496>[id number] --TTE: LVEF 55-60%, no LV RWMA, small circumfrential pericardial effusion, IVC normal --Continue to monitor on telemetry  Lower back pain, acute Lumbar spine x-ray ordered Voltaren gel Analgesics as needed  Anasarca IV fluid DC'd on 04/12/2021 Albumin 1.6 on 04/11/2021 IV Lasix 20 mg twice daily x2 days, started on 20 1522.  Acute renal failure on CKD stage IIIa Patient presenting with a creatinine of 2.78.  On recent discharge 04/05/2021 was 1.36.  Etiology likely secondary to dehydration from DKA as above. --Cr 2.78>>2.46> 1.79> 1.81. IV fluids stopped on 04/12/2021 due to anasarca. Closely monitor renal function while on IV diuretics, IV Lasix 20 mg twice daily Closely monitor urine output with strict I's and O's Continue to avoid nephrotoxins, renal dose all medications. Repeat BMP in the morning  Improving Non anion gap metabolic acidosis, improving, secondary to renal insufficiency. Continue p.o. sodium bicarb. Serum bicarb 18 from 17 Repeat BMP in the morning.  Resolved symptomatic anemia Hemoglobin on admission 7.6.  Recently discharged was 7.7.  Dropped to 6.7 on 04/09/2021.   Anemia panel with iron 45, TIBC 123, ferritin 340, vitamin B12 935, folate 6.5. Post 2 units PRBC transfusion, hemoglobin 9.1 on 04/11/2021. Continue to monitor.  Resolved leukocytosis Recent C. difficile colitis infection Recently diagnosed with C. difficile colitis, discharged on oral vancomycin.   Leukocytosis has resolved. Continue p.o. vancomycin while on antibiotics  for other infections.  Gastroparesis --Reglan 5 mg IV q8h scheduled --Zofran 4 mg IV every 8 hours as needed nausea/vomiting Diet has been advanced.  DVT prophylaxis: SCDs Start: 04/10/21 1549SCDs   Code Status: Full Code Family Communication: No family present at bedside this morning.  Disposition Plan:  Level of care: Progressive Status is: Inpatient  Remains inpatient appropriate because: Remains on insulin drip, on IV fluids, continues with nausea/vomiting, on heparin drip for NSTEMI    Consultants:  PCCM Cardiology  Procedures:  TTE  Antimicrobials:  Metronidazole 12/10>>   Objective: Vitals:   04/11/21 0500 04/11/21 1502 04/12/21 2015 04/13/21 0432  BP: (!) 152/109 (!) 140/92 (!) 139/96 (!) 147/95  Pulse:  (!) 110 (!) 108 (!) 104  Resp:  18 16 15   Temp: 97.8 F (36.6 C) 98.4 F (36.9 C) 98.6 F (37 C) 98.2 F (36.8 C)  TempSrc: Oral Oral Oral Oral  SpO2:  97% 99% 99%  Weight:      Height:       No intake or output data in the 24 hours ending 04/13/21 1457  Filed Weights   04/11/21 0443  Weight: 67.2 kg    Examination:  General exam: Well-developed well-nourished in no acute distress.  She is alert and oriented x3.   Respiratory system: Clear to auscultation no wheezes or rales.  Good inspiratory effort. Cardiovascular system: Regular rate and rhythm no rubs or gallops.  No JVD or thyromegaly noted.   Gastrointestinal system: Soft nontender normal bowel sounds present.   Central nervous system: Alert and oriented x4.  Nonfocal exam. Extremities: Anasarca, edema in upper and lower extremities bilaterally.   Skin: Cellulitis in the right upper extremity is improved. Psychiatry: Mood is appropriate for condition and setting.  Data Reviewed: I have personally reviewed following labs and imaging studies  CBC: Recent Labs  Lab 04/08/21 0728 04/08/21 1957 04/09/21 0139 04/09/21 0534 04/09/21 0741 04/10/21 2222 04/11/21 0417  WBC 20.3*  --    --  21.7*  --  10.1 9.2  NEUTROABS 13.1*  --   --   --   --  7.0  --   HGB 7.6*   < > 6.7* 7.2* 7.1* 8.8* 9.1*  HCT 28.8*   < > 21.4* 22.9* 21.9* 27.3* 27.9*  MCV 102.1*  --   --  85.4  --  85.6 84.8  PLT 371  --   --  369  --  295 292   < > = values in this interval not displayed.   Basic Metabolic Panel: Recent Labs  Lab 04/08/21 0725 04/08/21 0728 04/09/21 0741 04/10/21 2222 04/11/21 0417 04/12/21 0445 04/13/21 0421  NA  --    < > 134* 136 137 136 137  K  --    < > 3.7 3.7 3.8 3.8 3.6  CL  --    < > 110 113* 112* 113* 114*  CO2  --    < > 19* 19* 19* 17* 18*  GLUCOSE  --    < > 152* 152* 114* 88 215*  BUN  --    < > 24* 22* 21* 18 18  CREATININE  --    < > 2.54* 2.11* 1.87* 1.79* 1.81*  CALCIUM  --    < > 7.8* 7.4* 7.7* 7.5* 7.4*  MG 2.1  --   --   --   --   --   --   PHOS  --   --   --   --   --   --  2.7   < > = values in this interval not displayed.   GFR: Estimated Creatinine Clearance: 42.6 mL/min (A) (by C-G formula based on SCr of 1.81 mg/dL (H)). Liver Function Tests: Recent Labs  Lab 04/10/21 2222 04/11/21 0417  AST 35 41  ALT 19 19  ALKPHOS 98 109  BILITOT 0.5 0.3  PROT 4.6* 4.5*  ALBUMIN 1.6* 1.6*   No results for input(s): LIPASE, AMYLASE in the last 168 hours. No results for input(s): AMMONIA in the last 168 hours. Coagulation Profile: Recent Labs  Lab 04/08/21 1957  INR 1.2   Cardiac Enzymes: No results for input(s): CKTOTAL, CKMB, CKMBINDEX, TROPONINI in the last 168 hours. BNP (last 3 results) No results for input(s): PROBNP in the last 8760 hours. HbA1C: No results for input(s): HGBA1C in the last 72 hours. CBG: Recent Labs  Lab 04/12/21 1204 04/12/21 1616 04/12/21 2001 04/13/21 0736 04/13/21 1112  GLUCAP 279* 131* 80 280* 218*   Lipid Profile: No results for input(s): CHOL, HDL, LDLCALC, TRIG, CHOLHDL, LDLDIRECT in the last 72 hours. Thyroid Function Tests: No results for input(s): TSH, T4TOTAL, FREET4, T3FREE, THYROIDAB in  the last 72 hours. Anemia Panel: No results for input(s): VITAMINB12, FOLATE, FERRITIN, TIBC, IRON, RETICCTPCT in the last 72 hours.  Sepsis Labs: No results for input(s): PROCALCITON, LATICACIDVEN in the last 168 hours.  Recent Results (from the past 240 hour(s))  Resp Panel by RT-PCR (Flu A&B, Covid) Nasopharyngeal Swab     Status: None   Collection Time: 04/08/21  7:20 AM   Specimen: Nasopharyngeal Swab; Nasopharyngeal(NP) swabs in vial transport medium  Result Value Ref Range Status   SARS Coronavirus 2 by RT PCR NEGATIVE NEGATIVE Final  Comment: (NOTE) SARS-CoV-2 target nucleic acids are NOT DETECTED.  The SARS-CoV-2 RNA is generally detectable in upper respiratory specimens during the acute phase of infection. The lowest concentration of SARS-CoV-2 viral copies this assay can detect is 138 copies/mL. A negative result does not preclude SARS-Cov-2 infection and should not be used as the sole basis for treatment or other patient management decisions. A negative result may occur with  improper specimen collection/handling, submission of specimen other than nasopharyngeal swab, presence of viral mutation(s) within the areas targeted by this assay, and inadequate number of viral copies(<138 copies/mL). A negative result must be combined with clinical observations, patient history, and epidemiological information. The expected result is Negative.  Fact Sheet for Patients:  EntrepreneurPulse.com.au  Fact Sheet for Healthcare Providers:  IncredibleEmployment.be  This test is no t yet approved or cleared by the [place] FDA and  has been authorized for detection and/or diagnosis of SARS-CoV-2 by FDA under an Emergency Use Authorization (EUA). This EUA will remain  in effect (meaning this test can be used) for the duration of the COVID-19 declaration under Section 564(b)(1) of the Act, 21 U.S.C.section 360bbb-3(b)(1), unless the  authorization is terminated  or revoked sooner.       Influenza A by PCR NEGATIVE NEGATIVE Final   Influenza B by PCR NEGATIVE NEGATIVE Final    Comment: (NOTE) The Xpert Xpress SARS-CoV-2/FLU/RSV plus assay is intended as an aid in the diagnosis of influenza from Nasopharyngeal swab specimens and should not be used as a sole basis for treatment. Nasal washings and aspirates are unacceptable for Xpert Xpress SARS-CoV-2/FLU/RSV testing.  Fact Sheet for Patients: EntrepreneurPulse.com.au  Fact Sheet for Healthcare Providers: IncredibleEmployment.be  This test is not yet approved or cleared by the [place] FDA and has been authorized for detection and/or diagnosis of SARS-CoV-2 by FDA under an Emergency Use Authorization (EUA). This EUA will remain in effect (meaning this test can be used) for the duration of the COVID-19 declaration under Section 564(b)(1) of the Act, 21 U.S.C. section 360bbb-3(b)(1), unless the authorization is terminated or revoked.  Performed at [facility], [place] [address] [place], [place] [address]          Radiology Studies: No results found.      Scheduled Meds:  amLODipine  10 mg Oral Daily   carbamide peroxide  5 drop Both EARS BID   Chlorhexidine Gluconate Cloth  6 each Topical Daily   diclofenac Sodium  4 g Topical QID   famotidine  20 mg Oral BID   furosemide  20 mg Intravenous BID   insulin aspart  0-15 Units Subcutaneous TID WC   insulin aspart  0-5 Units Subcutaneous QHS   insulin glargine-yfgn  10 Units Subcutaneous Daily   lamoTRIgine  150 mg Oral Daily   linezolid  600 mg Oral Q12H   senna-docusate  2 tablet Oral QHS   sodium bicarbonate  1,300 mg Oral QID   sodium chloride flush  10-40 mL Intracatheter Q12H   sucralfate  1 g Oral TID WC & HS   vancomycin  125 mg Oral QID   Continuous Infusions:  cefTRIAXone (ROCEPHIN)  IV 2 g (04/13/21 1308)     LOS: 3 days     Critical Care Time Upon my evaluation, this patient had a high probability of imminent or life-threatening deterioration due to DKA requiring insulin drip, NSTEMI, which required my direct attention, intervention, and personal management.  I have personally provided 49 minutes of critical care  time exclusive of my time spent on separately billable procedures.  Time includes review of laboratory data, radiology results, discussion with consultants, and monitoring for potential decompensation.       Kayleen Memos, DO [facility] Available via Epic secure chat 7am-7pm After these hours, please refer to coverage provider listed on [email or web address] 04/13/2021, 2:57 PM

## 2021-04-14 ENCOUNTER — Other Ambulatory Visit (HOSPITAL_COMMUNITY): Payer: Self-pay

## 2021-04-14 DIAGNOSIS — R079 Chest pain, unspecified: Secondary | ICD-10-CM | POA: Diagnosis not present

## 2021-04-14 LAB — BASIC METABOLIC PANEL
Anion gap: 4 — ABNORMAL LOW (ref 5–15)
BUN: 19 mg/dL (ref 6–20)
CO2: 18 mmol/L — ABNORMAL LOW (ref 22–32)
Calcium: 7.6 mg/dL — ABNORMAL LOW (ref 8.9–10.3)
Chloride: 116 mmol/L — ABNORMAL HIGH (ref 98–111)
Creatinine, Ser: 1.71 mg/dL — ABNORMAL HIGH (ref 0.44–1.00)
GFR, Estimated: 41 mL/min — ABNORMAL LOW (ref >60)
Glucose, Bld: 130 mg/dL — ABNORMAL HIGH (ref 70–99)
Potassium: 3.5 mmol/L (ref 3.5–5.1)
Sodium: 138 mmol/L (ref 135–145)

## 2021-04-14 LAB — CBC
HCT: 26.2 % — ABNORMAL LOW (ref 36.0–46.0)
Hemoglobin: 8.4 g/dL — ABNORMAL LOW (ref 12.0–15.0)
MCH: 27.8 pg (ref 26.0–34.0)
MCHC: 32.1 g/dL (ref 30.0–36.0)
MCV: 86.8 fL (ref 80.0–100.0)
Platelets: 359 10*3/uL (ref 150–400)
RBC: 3.02 MIL/uL — ABNORMAL LOW (ref 3.87–5.11)
RDW: 16.3 % — ABNORMAL HIGH (ref 11.5–15.5)
WBC: 11.8 10*3/uL — ABNORMAL HIGH (ref 4.0–10.5)
nRBC: 0 % (ref 0.0–0.2)

## 2021-04-14 LAB — GLUCOSE, CAPILLARY
Glucose-Capillary: 113 mg/dL — ABNORMAL HIGH (ref 70–99)
Glucose-Capillary: 148 mg/dL — ABNORMAL HIGH (ref 70–99)

## 2021-04-14 MED ORDER — ALTEPLASE 2 MG IJ SOLR
2.0000 mg | Freq: Once | INTRAMUSCULAR | Status: DC
  Filled 2021-04-14: qty 2

## 2021-04-14 MED ORDER — SACCHAROMYCES BOULARDII 250 MG PO CAPS
250.0000 mg | ORAL_CAPSULE | Freq: Two times a day (BID) | ORAL | Status: DC
  Administered 2021-04-14: 250 mg via ORAL
  Filled 2021-04-14: qty 1

## 2021-04-14 MED ORDER — INSULIN GLARGINE 100 UNIT/ML ~~LOC~~ SOLN
10.0000 [IU] | Freq: Every day | SUBCUTANEOUS | 0 refills | Status: DC
  Filled 2021-04-14: qty 10, 90d supply, fill #0

## 2021-04-14 MED ORDER — CEFDINIR 300 MG PO CAPS
300.0000 mg | ORAL_CAPSULE | Freq: Two times a day (BID) | ORAL | Status: DC
  Administered 2021-04-14: 300 mg via ORAL
  Filled 2021-04-14 (×2): qty 1

## 2021-04-14 MED ORDER — SACCHAROMYCES BOULARDII 250 MG PO CAPS
250.0000 mg | ORAL_CAPSULE | Freq: Two times a day (BID) | ORAL | 0 refills | Status: AC
  Filled 2021-04-14: qty 60, 30d supply, fill #0

## 2021-04-14 MED ORDER — CEFDINIR 300 MG PO CAPS
300.0000 mg | ORAL_CAPSULE | Freq: Two times a day (BID) | ORAL | 0 refills | Status: AC
  Filled 2021-04-14: qty 12, 6d supply, fill #0

## 2021-04-14 MED ORDER — CEPHALEXIN 500 MG PO CAPS: 500.0000 mg | ORAL_CAPSULE | Freq: Three times a day (TID) | ORAL | Status: DC

## 2021-04-14 MED ORDER — SODIUM BICARBONATE 650 MG PO TABS
1300.0000 mg | ORAL_TABLET | Freq: Four times a day (QID) | ORAL | 0 refills | Status: AC
Start: 2021-04-14 — End: 2021-04-17
  Filled 2021-04-14: qty 24, 3d supply, fill #0

## 2021-04-14 MED ORDER — VANCOMYCIN HCL 125 MG PO CAPS
125.0000 mg | ORAL_CAPSULE | Freq: Four times a day (QID) | ORAL | 0 refills | Status: AC
Start: 2021-04-14 — End: 2021-04-26
  Filled 2021-04-14: qty 48, 12d supply, fill #0

## 2021-04-14 MED ORDER — OXYCODONE HCL 5 MG PO TABS
5.0000 mg | ORAL_TABLET | Freq: Three times a day (TID) | ORAL | 0 refills | Status: AC | PRN
  Filled 2021-04-14: qty 15, 5d supply, fill #0

## 2021-04-14 MED ORDER — LINEZOLID 600 MG PO TABS
600.0000 mg | ORAL_TABLET | Freq: Two times a day (BID) | ORAL | 0 refills | Status: AC
  Filled 2021-04-14: qty 12, 6d supply, fill #0

## 2021-04-14 MED ORDER — CARBAMIDE PEROXIDE 6.5 % OT SOLN
5.0000 [drp] | Freq: Two times a day (BID) | OTIC | 0 refills | Status: DC
  Filled 2021-04-14: qty 15, 30d supply, fill #0

## 2021-04-14 NOTE — Discharge Summary (Addendum)
Discharge Summary  Ashley Freeman HEN:[id number] DOB: Dec 03, 1990  PCP: System, Provider Not In  Admit date: 04/10/2021 Discharge date: 04/14/2021  Time spent: 35 minutes  Recommendations for Outpatient Follow-up:  Follow-up with your endocrinology Follow-up with your primary care provider Take your medications as prescribed  Discharge Diagnoses:  Active Hospital Problems   Diagnosis Date Noted   Demand ischemia [facility])    DKA (diabetic ketoacidosis) ([place]) 03/31/2021   Chest pain of uncertain etiology 36/14/4315    Resolved Hospital Problems  No resolved problems to display.    Discharge Condition: Stable.  Diet recommendation: Heart healthy carb modified diet.  Vitals:   04/13/21 2032 04/14/21 0559  BP: (!) 161/98 (!) 157/105  Pulse: (!) 105 (!) 102  Resp:  19  Temp: 98.3 F (36.8 C) 98.3 F (36.8 C)  SpO2: 97% 98%    History of present illness:   Ashley Freeman is a 30 year old female with past medical history significant for type 1 diabetes mellitus, CKD stage IIIa, GERD, essential hypertension, gastroparesis, medical noncompliance who presented to [facility] ED on 12/10 via EMS complaining of diffuse pain.  On EMS arrival, patient states her insulin pump had failed requiring her to take injections.  Patient complaining of diffuse body aches, nausea/vomiting, diarrhea over the last few days.  Did not try any medications for symptoms at home.  Given symptoms did not resolve, EMS was activated.  Work-up revealed severe DKA and elevated troponin.  Also revealed right upper extremity cellulitis for which she was started on IV antibiotics.  Seen by cardiology for elevated troponin thought secondary to demand ischemia in the setting of severe DKA.   She was transferred to [facility] for evaluation w/ the hand surgery team. After their evaluation, they did not see concern for compartment syndrome or necrotizing injury. They recommended elevation of arm and warm  compresses. She was returned to [facility] for care on 04/10/2021.     Hospital course complicated by transient reproducible chest pain which resolved, thought to be musculoskeletal.  Also complicated by anasarca and lower back pain.  Lumbar spine x-ray ordered.  Received IV Lasix 20 mg x2 doses with close monitoring of renal function.   04/14/21: Patient was seen at her bedside.  There were no acute events overnight.      Hospital Course:  Active Problems:   Chest pain of uncertain etiology   DKA (diabetic ketoacidosis) ([place])   Demand ischemia (H[facility])  Right forearm cellulitis, POA Ruled out necrotizing infection with myositis/necrosis concerning for compartment syndrome While receiving blood transfusion this morning, patient's IV infiltrated with patient experiencing severe pain to her right forearm.  Patient was having paresthesias to her fingers with decreased finger flexion/extension and significant pain on palpation of the anterior forearm.  Radial and ulnar pulses were intact.  CT right forearm/humerus without contrast notable for diffuse enlargement/heterogenicity forearm musculature both flexor/extensor compartments with numerous foci of soft tissue gas, suggestive of diffuse myositis with infectious Pyomyositis or myonecrosis with circumferential soft tissue edema and ill-defined fluid throughout right upper arm/forearm/hand suggestive of cellulitis.  No abscess identified but noncontrasted study.  [place]. [place] discussed with orthopedics on-call, Dr. Lynann Bologna who recommended transfer to [facility] for hand surgery evaluation as there is no hand surgeon on-call in [place] currently. -- Blood cultures were negative to date Received IV Zyvox and meropenem Antibiotics switched to Rocephin and p.o. linezolid on 04/11/2021. Currently afebrile with no leukocytosis.  Nonseptic appearing. Started on cefdinir for DC planning 300 mg  twice daily x 6 days.  P.o. linezolid 600 mg twice daily x 6 days.   Florastor 250 mg twice daily x30 days After completion of antibiotics for cellulitis continue with p.o. vancomycin until 04/26/2021.   Diabetic ketoacidosis Hx type 1 diabetes mellitus, poorly controlled Patient representing to the ED following recent discharge with diffuse body aches, difficulty with her insulin pump at home and was found to have an elevated glucose greater than 1000 with elevated beta hydroxybutyrate acid.  pH of 7.0.  Etiology likely secondary to noncompliance given her history and multiple hospitalizations for same.  Hemoglobin A1c 10.5 on 03/07/2021.  Initially started on insulin drip with resolution of DKA with normalization of anion gap and beta hydroxybutyrate acid. -- Seen by diabetic educator  --Transitioned insulin drip to Semglee 10 units subcutaneously daily --SSI for coverage Follow-up with your endocrinologist.   Resolved chest pain Elevated troponin suspect demand ischemia in the setting of severe DKA type I with pH of 7.0 on presentation. Patient presenting with diffuse body aches/pain.  Initial troponin elevated at 496.  EKG with sinus tachycardia, rate 113, QTc 491, no concerning dynamic changes.   Seen by cardiology, signed off. --hs Troponin 496>811>1071>1032 --TTE: LVEF 55-60%, no LV RWMA, small circumfrential pericardial effusion, IVC normal She was monitored on telemetry.   Lower back pain, acute Lumbar spine x-ray, 04/13/21, negative. Voltaren gel Analgesics as needed Follow-up with your primary care provider   Anasarca IV fluid DC'd on 04/12/2021 Albumin 1.6 on 04/11/2021 IV Lasix 20 mg twice daily x2 days, received 2 doses from 04/13/21.   Acute renal failure on CKD stage IIIa Patient presenting with a creatinine of 2.78.  On recent discharge 04/05/2021 was 1.36.  Etiology likely secondary to dehydration from DKA as above. --Cr 2.78>>2.46> 1.79> 1.81> 1.71 with GFR 41 on 04/14/2021. IV fluids stopped on 04/12/2021 due to anasarca. Continue  to avoid nephrotoxins, renal dose all medications. Follow-up with your primary care provider   Improving Non anion gap metabolic acidosis, secondary to renal insufficiency. Continue p.o. sodium bicarb x3 days. Serum bicarb 18 from 17 Follow-up with your PCP.   Resolved symptomatic anemia Hemoglobin on admission 7.6.  Recently discharged was 7.7.  Dropped to 6.7 on 04/09/2021.   Anemia panel with iron 45, TIBC 123, ferritin 340, vitamin B12 935, folate 6.5. Post 2 units PRBC transfusion No overt bleeding. Follow-up with your PCP.   Resolved leukocytosis Recent C. difficile colitis infection Recently diagnosed with C. difficile colitis, discharged on oral vancomycin.   Leukocytosis has resolved. Continue p.o. vancomycin while on antibiotics for other infections and after completion of antibiotics for an additional 7 days of p.o. vancomycin.   Gastroparesis Received Reglan 5 mg IV q8h scheduled Stable Tolerating a diet.      Code Status: Full Code    Consultants:  PCCM Cardiology   Procedures:  TTE    Discharge Exam: BP (!) 157/105 (BP Location: Left Arm)    Pulse (!) 102    Temp 98.3 F (36.8 C) (Oral)    Resp 19    Ht 5' 3" (1.6 m)    Wt 67.2 kg    SpO2 98%    BMI 26.24 kg/m  General: 30 y.o. year-old female well developed well nourished in no acute distress.  Alert and oriented x3. Cardiovascular: Regular rate and rhythm with no rubs or gallops.  No thyromegaly or JVD noted.   Respiratory: Clear to auscultation with no wheezes or rales. Good inspiratory effort. Abdomen: Soft  nontender nondistended with normal bowel sounds x4 quadrants. Musculoskeletal: No lower extremity edema. 2/4 pulses in all 4 extremities. Skin: No ulcerative lesions noted or rashes, Psychiatry: Mood is appropriate for condition and setting  Discharge Instructions You were cared for by a hospitalist during your hospital stay. If you have any questions about your discharge medications or the care  you received while you were in the hospital after you are discharged, you can call the unit and asked to speak with the hospitalist on call if the hospitalist that took care of you is not available. Once you are discharged, your primary care physician will handle any further medical issues. Please note that NO REFILLS for any discharge medications will be authorized once you are discharged, as it is imperative that you return to your primary care physician (or establish a relationship with a primary care physician if you do not have one) for your aftercare needs so that they can reassess your need for medications and monitor your lab values.   Allergies as of 04/14/2021       Reactions   Cephalexin Anaphylaxis   Has gotten ceftriaxone in the past    Penicillins Hives, Rash   Has patient had a PCN reaction causing immediate rash, facial/tongue/throat swelling, SOB or lightheadedness with hypotension: Yes Has patient had a PCN reaction causing severe rash involving mucus membranes or skin necrosis: No Has patient had a PCN reaction that required hospitalization: Yes Has patient had a PCN reaction occurring within the last 10 years: No Spoke with pt - childhood hives told by mom, tried no pcns since, doesn't remember reaction herself    Benadryl [diphenhydramine] Itching   Doxycycline Itching        Medication List     STOP taking these medications    aspirin 325 MG tablet   insulin glargine-yfgn 100 UNIT/ML injection Commonly known as: SEMGLEE Replaced by: Lantus 100 UNIT/ML injection   linezolid 600 MG/300ML IVPB Commonly known as: ZYVOX Replaced by: linezolid 600 MG tablet   meropenem 1 g in sodium chloride 0.9 % 100 mL   metoCLOPramide 5 MG/ML injection Commonly known as: REGLAN   metroNIDAZOLE 500 MG/100ML Commonly known as: FLAGYL   morphine 4 MG/ML injection   nystatin 100000 UNIT/ML suspension Commonly known as: MYCOSTATIN   Omnipod 5 G6 Intro (Gen 5) Kit    ondansetron 4 MG/2ML Soln injection Commonly known as: ZOFRAN   sucralfate 1 g tablet Commonly known as: CARAFATE       TAKE these medications    albuterol 108 (90 Base) MCG/ACT inhaler Commonly known as: VENTOLIN HFA Inhale 2 puffs into the lungs every 4 (four) hours as needed for wheezing or shortness of breath.   amLODipine 10 MG tablet Commonly known as: NORVASC Take 1 tablet (10 mg total) by mouth daily.   blood glucose meter kit and supplies Kit Dispense based on patient and insurance preference. Use up to four times daily as directed. (FOR ICD-9 250.00, 250.01).   Blood Pressure Kit Devi 1 Device by Does not apply route as needed.   carbamide peroxide 6.5 % OTIC solution Commonly known as: DEBROX Place 5 drops into both ears 2 (two) times daily.   cefdinir 300 MG capsule Commonly known as: OMNICEF Take 1 capsule by mouth every 12 hours for 6 days.   Dexcom G6 Sensor Misc Inject 1 Device into the skin as directed.   Dexcom G6 Transmitter Misc Inject 1 Device into the skin as directed. Use to  check blood sugar daily   famotidine 20 MG tablet Commonly known as: PEPCID Take 1 tablet (20 mg total) by mouth 2 (two) times daily.   ibuprofen 800 MG tablet Commonly known as: ADVIL Take 1 tablet (800 mg total) by mouth 3 (three) times daily. What changed:  when to take this reasons to take this   insulin aspart 100 UNIT/ML injection Commonly known as: novoLOG Inject 0-6 Units into the skin 3 (three) times daily with meals. What changed: additional instructions   ipratropium 17 MCG/ACT inhaler Commonly known as: ATROVENT HFA Inhale 1 puff into the lungs every 6 (six) hours.   lamoTRIgine 150 MG tablet Commonly known as: LAMICTAL Take 150 mg by mouth daily.   Lantus 100 UNIT/ML injection Generic drug: insulin glargine Inject 0.1 mLs (10 Units total) into the skin daily. Replaces: insulin glargine-yfgn 100 UNIT/ML injection   linezolid 600 MG  tablet Commonly known as: ZYVOX Take 1 tablet by mouth every 12 hours for 6 days. Replaces: linezolid 600 MG/300ML IVPB   oxyCODONE 5 MG immediate release tablet Commonly known as: Oxy IR/ROXICODONE Take 1 tablet  by mouth every 8  hours as needed for up to 5 days for severe pain.   Pentips 32G X 4 MM Misc Generic drug: Insulin Pen Needle Use as directed   saccharomyces boulardii 250 MG capsule Commonly known as: FLORASTOR Take 1 capsule (250 mg total) by mouth 2 (two) times daily.   sodium bicarbonate 650 MG tablet Take 2 tablets by mouth 4 times daily for 3 days.   vancomycin 125 MG capsule Commonly known as: VANCOCIN Take 1 capsule by mouth 4 times daily for 12 days. What changed: additional instructions       Allergies  Allergen Reactions   Cephalexin Anaphylaxis    Has gotten ceftriaxone in the past    Penicillins Hives and Rash    Has patient had a PCN reaction causing immediate rash, facial/tongue/throat swelling, SOB or lightheadedness with hypotension: Yes Has patient had a PCN reaction causing severe rash involving mucus membranes or skin necrosis: No Has patient had a PCN reaction that required hospitalization: Yes Has patient had a PCN reaction occurring within the last 10 years: No Spoke with pt - childhood hives told by mom, tried no pcns since, doesn't remember reaction herself    Benadryl [Diphenhydramine] Itching   Doxycycline Itching    Follow-up [place]. Call today.   Why: Please call for an posthospital follow-up appointment. Contact information: [address] [place] [address] [phone]                 The results of significant diagnostics from this hospitalization (including imaging, microbiology, ancillary and laboratory) are listed below for reference.    Significant Diagnostic Studies: DG Chest 1 View  Result Date: 04/09/2021 CLINICAL DATA:  Central  line placement EXAM: CHEST  1 VIEW COMPARISON:  04/09/2021 FINDINGS: Single frontal view of the chest demonstrates interval placement of a left internal jugular catheter, tip overlying the atriocaval junction. Cardiac silhouette is stable. Bibasilar veiling opacities are unchanged. No pneumothorax. IMPRESSION: 1. Stable bibasilar veiling opacities consistent with consolidation and effusion. 2. No complication after central venous catheter placement. Electronically Signed   By: Randa Ngo M.D.   On: 04/09/2021 16:01   DG Lumbar Spine Complete  Result Date: 04/13/2021 CLINICAL DATA:  Back pain. EXAM: LUMBAR SPINE - COMPLETE 4+ VIEW COMPARISON:  11/21/2013 FINDINGS:  There is no evidence of lumbar spine fracture. Alignment is normal. Intervertebral disc spaces are maintained. Negative for pars defect. IMPRESSION: Negative. Electronically Signed   By: Markus Daft M.D.   On: 04/13/2021 16:34   CT HUMERUS RIGHT WO CONTRAST  Result Date: 04/09/2021 CLINICAL DATA:  Forearm pain, stress fracture suspected, no prior imaging Right upper extremity pain following infiltrated IV; Upper arm pain, stress fracture suspected, no prior imaging Right upper extremity pain following infiltrated IV EXAM: CT OF THE RIGHT FOREARM WITHOUT CONTRAST; CT OF THE RIGHT HUMERUS WITHOUT CONTRAST TECHNIQUE: Multidetector CT imaging was performed according to the standard protocol. Multiplanar CT image reconstructions were also generated. COMPARISON:  None. FINDINGS: Bones/Joint/Cartilage No acute fracture. No dislocation. No significant arthropathy. No bone erosion or periosteal elevation. Small elbow joint effusion, nonspecific. Ligaments Suboptimally assessed by CT. Muscles and Tendons There is diffuse enlargement and heterogeneity of the forearm musculature involving both the flexor and extensor compartments. There are numerous foci of soft tissue gas within both compartments, more pronounced within the flexor compartment of the  proximal to mid right forearm. No definite intramuscular fluid collection or extension of soft tissue gas into the upper arm. No gross tendinous abnormality. Soft tissues Marked circumferential soft tissue edema with ill-defined fluid throughout the right upper arm, forearm, and extending to the dorsal aspect of the wrist and hand. No obvious abscess within the subcutaneous soft tissues, although evaluation is slightly limited in the absence of intravenous contrast. Diffuse anasarca of the visualized right chest and abdominal wall. Moderate sized layering right-sided pleural effusion with patchy airspace opacity in the included aerated right lung. No right axillary lymphadenopathy. IMPRESSION: 1. Diffuse enlargement and heterogeneity of the forearm musculature involving both the flexor and extensor compartments. Numerous foci of soft tissue gas within both compartments, more pronounced within the flexor compartment of the proximal to mid right forearm. Appearance is suggestive of diffuse myositis with infectious pyomyositis or myonecrosis. 2. Marked circumferential soft tissue edema with ill-defined fluid throughout the right upper arm, forearm, and hand, most suggestive of cellulitis. No obvious abscess within the subcutaneous soft tissues, although evaluation is slightly limited in the absence of intravenous contrast. 3. Small elbow joint effusion, nonspecific. Septic arthritis not excluded. 4. No evidence of osteomyelitis. 5. Moderate sized layering right-sided pleural effusion with patchy airspace opacity in the visualized right lung. 6. Diffuse anasarca of the visualized right chest and abdominal wall. These results will be called to the ordering clinician or representative by the Radiologist Assistant, and communication documented in the PACS or [employer]. Electronically Signed   By: Davina Poke D.O.   On: 04/09/2021 16:47   CT FOREARM RIGHT WO CONTRAST  Result Date: 04/09/2021 CLINICAL DATA:   Forearm pain, stress fracture suspected, no prior imaging Right upper extremity pain following infiltrated IV; Upper arm pain, stress fracture suspected, no prior imaging Right upper extremity pain following infiltrated IV EXAM: CT OF THE RIGHT FOREARM WITHOUT CONTRAST; CT OF THE RIGHT HUMERUS WITHOUT CONTRAST TECHNIQUE: Multidetector CT imaging was performed according to the standard protocol. Multiplanar CT image reconstructions were also generated. COMPARISON:  None. FINDINGS: Bones/Joint/Cartilage No acute fracture. No dislocation. No significant arthropathy. No bone erosion or periosteal elevation. Small elbow joint effusion, nonspecific. Ligaments Suboptimally assessed by CT. Muscles and Tendons There is diffuse enlargement and heterogeneity of the forearm musculature involving both the flexor and extensor compartments. There are numerous foci of soft tissue gas within both compartments, more pronounced within the flexor compartment of the  proximal to mid right forearm. No definite intramuscular fluid collection or extension of soft tissue gas into the upper arm. No gross tendinous abnormality. Soft tissues Marked circumferential soft tissue edema with ill-defined fluid throughout the right upper arm, forearm, and extending to the dorsal aspect of the wrist and hand. No obvious abscess within the subcutaneous soft tissues, although evaluation is slightly limited in the absence of intravenous contrast. Diffuse anasarca of the visualized right chest and abdominal wall. Moderate sized layering right-sided pleural effusion with patchy airspace opacity in the included aerated right lung. No right axillary lymphadenopathy. IMPRESSION: 1. Diffuse enlargement and heterogeneity of the forearm musculature involving both the flexor and extensor compartments. Numerous foci of soft tissue gas within both compartments, more pronounced within the flexor compartment of the proximal to mid right forearm. Appearance is  suggestive of diffuse myositis with infectious pyomyositis or myonecrosis. 2. Marked circumferential soft tissue edema with ill-defined fluid throughout the right upper arm, forearm, and hand, most suggestive of cellulitis. No obvious abscess within the subcutaneous soft tissues, although evaluation is slightly limited in the absence of intravenous contrast. 3. Small elbow joint effusion, nonspecific. Septic arthritis not excluded. 4. No evidence of osteomyelitis. 5. Moderate sized layering right-sided pleural effusion with patchy airspace opacity in the visualized right lung. 6. Diffuse anasarca of the visualized right chest and abdominal wall. These results will be called to the ordering clinician or representative by the Radiologist Assistant, and communication documented in the PACS or [employer]. Electronically Signed   By: Davina Poke D.O.   On: 04/09/2021 16:47   DG Chest Port 1 View  Result Date: 04/09/2021 CLINICAL DATA:  Hyperglycemia, chest pain EXAM: PORTABLE CHEST 1 VIEW COMPARISON:  Prior chest x-ray 04/01/2021 FINDINGS: Patient is rotated to the right. This distorts the cardiac and mediastinal contours. Persistent streaky airspace opacities in the left lung base. New developing linear opacities in right lung base. Additionally, there is new blunting of the right costophrenic angle consistent with a small pleural effusion. Cardiac and mediastinal contours remain grossly unchanged. No pneumothorax. No acute osseous abnormality. IMPRESSION: 1. Patient markedly rotated toward the right. 2. Probable bibasilar atelectasis. Superimposed infiltrate is difficult to exclude. 3. Suspect small bilateral pleural effusions larger on the right than the left. Electronically Signed   By: Jacqulynn Cadet M.D.   On: 04/09/2021 07:47   DG Chest Port 1 View  Result Date: 04/01/2021 CLINICAL DATA:  DKA. Recent COVID. Left lower lobe atelectasis versus infiltrate. EXAM: PORTABLE CHEST 1 VIEW  COMPARISON:  03/31/2021 FINDINGS: Increased hazy densities in the left lower lung. Slightly increased interstitial densities in both lungs. Heart size is within normal limits and stable. Negative for a pneumothorax. IMPRESSION: 1. Hazy densities in left lower chest. Findings could represent pleural fluid or atelectasis. 2. Concern for mild interstitial pulmonary edema. Electronically Signed   By: Markus Daft M.D.   On: 04/01/2021 09:03   DG CHEST PORT 1 VIEW  Result Date: 03/31/2021 CLINICAL DATA:  Shortness of breath EXAM: PORTABLE CHEST 1 VIEW COMPARISON:  03/31/2021 FINDINGS: The heart size and mediastinal contours are within normal limits. Both lungs are clear. The visualized skeletal structures are unremarkable. IMPRESSION: No active disease. Electronically Signed   By: Jerilynn Mages.  Shick M.D.   On: 03/31/2021 15:49   DG Chest Portable 1 View  Result Date: 03/31/2021 CLINICAL DATA:  Diabetic ketoacidosis EXAM: PORTABLE CHEST 1 VIEW COMPARISON:  Prior chest x-ray 03/20/2021 FINDINGS: Low inspiratory volumes. Streaky opacities in the left  lower lobe favored to reflect atelectasis. Cardiac and mediastinal contours are normal. No pleural effusion or pneumothorax. No acute osseous abnormality. IMPRESSION: Streaky airspace opacities in the left lower lobe favored to reflect atelectasis. Early bronchopneumonia is difficult to exclude entirely. Recommend dedicated PA and lateral chest x-ray when the patient is able. Electronically Signed   By: Jacqulynn Cadet M.D.   On: 03/31/2021 11:11   DG Chest Portable 1 View  Result Date: 03/20/2021 CLINICAL DATA:  Acidosis.  COVID positive. EXAM: PORTABLE CHEST 1 VIEW COMPARISON:  Radiograph 03/07/2021 FINDINGS: The cardiomediastinal contours are normal. The lungs are clear. Pulmonary vasculature is normal. No consolidation, pleural effusion, or pneumothorax. No acute osseous abnormalities are seen. IMPRESSION: No acute chest findings. Electronically Signed   By: Keith Rake M.D.   On: 03/20/2021 22:57   DG Abd Portable 1V  Result Date: 04/01/2021 CLINICAL DATA:  C difficile colitis EXAM: PORTABLE ABDOMEN - 1 VIEW COMPARISON:  2013 FINDINGS: The bowel gas pattern is normal. No radio-opaque calculi or other significant radiographic abnormality are seen. IMPRESSION: Negative. Electronically Signed   By: Macy Mis M.D.   On: 04/01/2021 10:02   ECHOCARDIOGRAM COMPLETE  Result Date: 04/09/2021    ECHOCARDIOGRAM REPORT   Patient Name:   Ashley Freeman [facility] Date of Exam: 04/09/2021 Medical Rec #:  [id number]                    Height:       63.0 in Accession #:    [id number]                   Weight:       136.7 lb Date of Birth:  [birth year]                     BSA:          1.645 m Patient Age:    30 years                     BP:           142/76 mmHg Patient Gender: F                            HR:           101 bpm. Exam Location:  Inpatient Procedure: Limited Echo, Cardiac Doppler and Limited Color Doppler Indications:    Elevated Troponin, limited  History:        Patient has prior history of Echocardiogram examinations, most                 recent 04/01/2021. Signs/Symptoms:Chest Pain and Shortness of                 Breath; Risk Factors:Diabetes.  Sonographer:    Glo Herring Referring Phys: [id number] [place]  1. Left ventricular ejection fraction, by estimation, is 55 to 60%. The left ventricle has normal function. The left ventricle has no regional wall motion abnormalities. Left ventricular diastolic parameters were normal.  2. Right ventricular systolic function is normal. The right ventricular size is normal. Tricuspid regurgitation signal is inadequate for assessing PA pressure.  3. A small pericardial effusion is present. The pericardial effusion is circumferential. There is no evidence of cardiac tamponade.  4. The mitral valve is grossly normal. Trivial mitral valve regurgitation. No evidence of mitral stenosis.  5. The aortic  valve is tricuspid. Aortic valve regurgitation is not visualized. No aortic stenosis is present.  6. The inferior vena cava is normal in size with greater than 50% respiratory variability, suggesting right atrial pressure of 3 mmHg. FINDINGS  Left Ventricle: Left ventricular ejection fraction, by estimation, is 55 to 60%. The left ventricle has normal function. The left ventricle has no regional wall motion abnormalities. The left ventricular internal cavity size was normal in size. There is  no left ventricular hypertrophy. Left ventricular diastolic parameters were normal. Right Ventricle: The right ventricular size is normal. No increase in right ventricular wall thickness. Right ventricular systolic function is normal. Tricuspid regurgitation signal is inadequate for assessing PA pressure. Left Atrium: Left atrial size was normal in size. Right Atrium: Right atrial size was normal in size. Pericardium: A small pericardial effusion is present. The pericardial effusion is circumferential. There is no evidence of cardiac tamponade. Presence of epicardial fat layer. Mitral Valve: The mitral valve is grossly normal. Trivial mitral valve regurgitation. No evidence of mitral valve stenosis. Tricuspid Valve: The tricuspid valve is grossly normal. Tricuspid valve regurgitation is trivial. No evidence of tricuspid stenosis. Aortic Valve: The aortic valve is tricuspid. Aortic valve regurgitation is not visualized. No aortic stenosis is present. Pulmonic Valve: The pulmonic valve was grossly normal. Pulmonic valve regurgitation is trivial. No evidence of pulmonic stenosis. Aorta: The aortic root is normal in size and structure. Venous: The inferior vena cava is normal in size with greater than 50% respiratory variability, suggesting right atrial pressure of 3 mmHg. IAS/Shunts: The atrial septum is grossly normal. Additional Comments: There is a small pleural effusion in the left lateral region.  LEFT VENTRICLE PLAX 2D  LVIDd:         4.40 cm Diastology LVIDs:         3.20 cm LV e' medial:    9.03 cm/s LV PW:         0.90 cm LV E/e' medial:  11.1 LV IVS:        0.90 cm LV e' lateral:   10.80 cm/s                        LV E/e' lateral: 9.2  IVC IVC diam: 1.20 cm LEFT ATRIUM         Index LA diam:    3.00 cm 1.82 cm/m  MITRAL VALVE MV Area (PHT): 5.23 cm MV Decel Time: 145 msec MV E velocity: 99.80 cm/s MV A velocity: 94.70 cm/s MV E/A ratio:  1.05 Eleonore Chiquito MD Electronically signed by Eleonore Chiquito MD Signature Date/Time: 04/09/2021/1:08:51 PM    Final    ECHOCARDIOGRAM COMPLETE  Result Date: 04/01/2021    ECHOCARDIOGRAM REPORT   Patient Name:   Ashley Freeman [facility] Date of Exam: 04/01/2021 Medical Rec #:  [id number]                    Height:       63.0 in Accession #:    [id number]                   Weight:       142.2 lb Date of Birth:  07-31-1990                     BSA:          1.673 m Patient Age:    61 years  BP:           140/59 mmHg Patient Gender: F                            HR:           104 bpm. Exam Location:  Inpatient Procedure: 2D Echo, Cardiac Doppler and Color Doppler Indications:    Cardiomyopathy-Unspecified I42.9  History:        Patient has prior history of Echocardiogram examinations, most                 recent 12/08/2020. Risk Factors:Diabetes and Dyslipidemia.  Sonographer:    Bernadene Person RDCS Referring Phys: [place]  1. Left ventricular ejection fraction, by estimation, is 65 to 70%. The left ventricle has normal function. The left ventricle has no regional wall motion abnormalities. Left ventricular diastolic parameters were normal.  2. Right ventricular systolic function is normal. The right ventricular size is normal. There is mildly elevated pulmonary artery systolic pressure.  3. The mitral valve is normal in structure. Trivial mitral valve regurgitation.  4. The aortic valve is normal in structure. Aortic valve regurgitation is not  visualized. No aortic stenosis is present. FINDINGS  Left Ventricle: Left ventricular ejection fraction, by estimation, is 65 to 70%. The left ventricle has normal function. The left ventricle has no regional wall motion abnormalities. The left ventricular internal cavity size was small. There is no left ventricular hypertrophy. Left ventricular diastolic parameters were normal. Right Ventricle: The right ventricular size is normal. Right vetricular wall thickness was not well visualized. Right ventricular systolic function is normal. There is mildly elevated pulmonary artery systolic pressure. The tricuspid regurgitant velocity  is 2.94 m/s, and with an assumed right atrial pressure of 3 mmHg, the estimated right ventricular systolic pressure is 65.0 mmHg. Left Atrium: Left atrial size was normal in size. Right Atrium: Right atrial size was normal in size. Pericardium: There is no evidence of pericardial effusion. Mitral Valve: The mitral valve is normal in structure. Trivial mitral valve regurgitation. Tricuspid Valve: The tricuspid valve is normal in structure. Tricuspid valve regurgitation is mild. Aortic Valve: The aortic valve is normal in structure. Aortic valve regurgitation is not visualized. No aortic stenosis is present. Pulmonic Valve: The pulmonic valve was grossly normal. Pulmonic valve regurgitation is trivial. Aorta: The aortic root and ascending aorta are structurally normal, with no evidence of dilitation. IAS/Shunts: The interatrial septum was not well visualized.  LEFT VENTRICLE PLAX 2D LVIDd:         3.80 cm     Diastology LVIDs:         2.70 cm     LV e' medial:    8.43 cm/s LV PW:         0.80 cm     LV E/e' medial:  12.6 LV IVS:        0.70 cm     LV e' lateral:   10.30 cm/s LVOT diam:     1.80 cm     LV E/e' lateral: 10.3 LV SV:         42 LV SV Index:   25 LVOT Area:     2.54 cm  LV Volumes (MOD) LV vol d, MOD A2C: 67.6 ml LV vol d, MOD A4C: 77.5 ml LV vol s, MOD A2C: 29.6 ml LV vol s, MOD  A4C: 32.4 ml LV SV MOD A2C:  38.0 ml LV SV MOD A4C:     77.5 ml LV SV MOD BP:      42.6 ml RIGHT VENTRICLE RV S prime:     13.60 cm/s TAPSE (M-mode): 2.4 cm LEFT ATRIUM             Index        RIGHT ATRIUM          Index LA diam:        1.80 cm 1.08 cm/m   RA Area:     9.32 cm LA Vol (A2C):   19.6 ml 11.72 ml/m  RA Volume:   17.50 ml 10.46 ml/m LA Vol (A4C):   21.6 ml 12.91 ml/m LA Biplane Vol: 21.0 ml 12.55 ml/m  AORTIC VALVE LVOT Vmax:   109.00 cm/s LVOT Vmean:  74.900 cm/s LVOT VTI:    0.166 m  AORTA Ao Root diam: 2.60 cm Ao Asc diam:  2.70 cm MITRAL VALVE                TRICUSPID VALVE MV Area (PHT): 5.62 cm     TR Peak grad:   34.6 mmHg MV Decel Time: 135 msec     TR Vmax:        294.00 cm/s MV E velocity: 106.00 cm/s MV A velocity: 104.00 cm/s  SHUNTS MV E/A ratio:  1.02         Systemic VTI:  0.17 m                             Systemic Diam: 1.80 cm Mertie Moores MD Electronically signed by Mertie Moores MD Signature Date/Time: 04/01/2021/4:21:52 PM    Final    [place] EKG SITE RITE  Result Date: 04/08/2021 If Site Rite image not attached, placement could not be confirmed due to current cardiac rhythm.   Microbiology: Recent Results (from the past 240 hour(s))  Resp Panel by RT-PCR (Flu A&B, Covid) Nasopharyngeal Swab     Status: None   Collection Time: 04/08/21  7:20 AM   Specimen: Nasopharyngeal Swab; Nasopharyngeal(NP) swabs in vial transport medium  Result Value Ref Range Status   SARS Coronavirus 2 by RT PCR NEGATIVE NEGATIVE Final    Comment: (NOTE) SARS-CoV-2 target nucleic acids are NOT DETECTED.  The SARS-CoV-2 RNA is generally detectable in upper respiratory specimens during the acute phase of infection. The lowest concentration of SARS-CoV-2 viral copies this assay can detect is 138 copies/mL. A negative result does not preclude SARS-Cov-2 infection and should not be used as the sole basis for treatment or other patient management decisions. A negative result may occur  with  improper specimen collection/handling, submission of specimen other than nasopharyngeal swab, presence of viral mutation(s) within the areas targeted by this assay, and inadequate number of viral copies(<138 copies/mL). A negative result must be combined with clinical observations, patient history, and epidemiological information. The expected result is Negative.  Fact Sheet for Patients:  EntrepreneurPulse.com.au  Fact Sheet for Healthcare Providers:  IncredibleEmployment.be  This test is no t yet approved or cleared by the [place] FDA and  has been authorized for detection and/or diagnosis of SARS-CoV-2 by FDA under an Emergency Use Authorization (EUA). This EUA will remain  in effect (meaning this test can be used) for the duration of the COVID-19 declaration under Section 564(b)(1) of the Act, 21 U.S.C.section 360bbb-3(b)(1), unless the authorization is terminated  or revoked sooner.  Influenza A by PCR NEGATIVE NEGATIVE Final   Influenza B by PCR NEGATIVE NEGATIVE Final    Comment: (NOTE) The Xpert Xpress SARS-CoV-2/FLU/RSV plus assay is intended as an aid in the diagnosis of influenza from Nasopharyngeal swab specimens and should not be used as a sole basis for treatment. Nasal washings and aspirates are unacceptable for Xpert Xpress SARS-CoV-2/FLU/RSV testing.  Fact Sheet for Patients: EntrepreneurPulse.com.au  Fact Sheet for Healthcare Providers: IncredibleEmployment.be  This test is not yet approved or cleared by the [place] FDA and has been authorized for detection and/or diagnosis of SARS-CoV-2 by FDA under an Emergency Use Authorization (EUA). This EUA will remain in effect (meaning this test can be used) for the duration of the COVID-19 declaration under Section 564(b)(1) of the Act, 21 U.S.C. section 360bbb-3(b)(1), unless the authorization is terminated  or revoked.  Performed at [facility], [place] [address], [place], [place] [address]      Labs: Basic Metabolic Panel: Recent Labs  Lab 04/08/21 0725 04/08/21 1638 04/10/21 2222 04/11/21 0417 04/12/21 0445 04/13/21 0421 04/14/21 0834  NA  --    < > 136 137 136 137 138  K  --    < > 3.7 3.8 3.8 3.6 3.5  CL  --    < > 113* 112* 113* 114* 116*  CO2  --    < > 19* 19* 17* 18* 18*  GLUCOSE  --    < > 152* 114* 88 215* 130*  BUN  --    < > 22* 21* _0 CREATININE  --    < > 2.11* 1.87* 1.79* 1.81* 1.71*  CALCIUM  --    < > 7.4* 7.7* 7.5* 7.4* 7.6*  MG 2.1  --   --   --   --   --   --   PHOS  --   --   --   --   --  2.7  --    < > = values in this interval not displayed.   Liver Function Tests: Recent Labs  Lab 04/10/21 2222 04/11/21 0417  AST 35 41  ALT 19 19  ALKPHOS 98 109  BILITOT 0.5 0.3  PROT 4.6* 4.5*  ALBUMIN 1.6* 1.6*   No results for input(s): LIPASE, AMYLASE in the last 168 hours. No results for input(s): AMMONIA in the last 168 hours. CBC: Recent Labs  Lab 04/08/21 0728 04/08/21 1957 04/09/21 0534 04/09/21 0741 04/10/21 2222 04/11/21 0417 04/14/21 0435  WBC 20.3*  --  21.7*  --  10.1 9.2 11.8*  NEUTROABS 13.1*  --   --   --  7.0  --   --   HGB 7.6*   < > 7.2* 7.1* 8.8* 9.1* 8.4*  HCT 28.8*   < > 22.9* 21.9* 27.3* 27.9* 26.2*  MCV 102.1*  --  85.4  --  85.6 84.8 86.8  PLT 371  --  369  --  295 292 359   < > = values in this interval not displayed.   Cardiac Enzymes: No results for input(s): CKTOTAL, CKMB, CKMBINDEX, TROPONINI in the last 168 hours. BNP: BNP (last 3 results) Recent Labs    04/13/21 1133  BNP 963.0*    ProBNP (last 3 results) No results for input(s): PROBNP in the last 8760 hours.  CBG: Recent Labs  Lab 04/13/21 1112 04/13/21 1653 04/13/21 2030 04/14/21 0805 04/14/21 1206  GLUCAP 218* 118* 147* 113* 148*  Signed:  Kayleen Memos, MD [facility] 04/14/2021, 5:21 PM

## 2021-04-14 NOTE — Progress Notes (Signed)
AVS given to the pt with new list of medicines and follow up appointment with PCP. Handed to pt all home medicines given by the pharmacy.

## 2021-04-14 NOTE — Plan of Care (Signed)
  Problem: Activity: Goal: Risk for activity intolerance will decrease Outcome: Adequate for Discharge   Problem: Nutrition: Goal: Adequate nutrition will be maintained Outcome: Adequate for Discharge   Problem: Elimination: Goal: Will not experience complications related to bowel motility Outcome: Adequate for Discharge   Problem: Pain Managment: Goal: General experience of comfort will improve Outcome: Adequate for Discharge   Problem: Skin Integrity: Goal: Risk for impaired skin integrity will decrease Outcome: Adequate for Discharge   

## 2021-04-16 DIAGNOSIS — R601 Generalized edema: Secondary | ICD-10-CM | POA: Insufficient documentation

## 2021-04-17 ENCOUNTER — Telehealth: Payer: Self-pay | Admitting: Internal Medicine

## 2021-04-17 ENCOUNTER — Encounter: Payer: Self-pay | Admitting: Radiology

## 2021-04-17 NOTE — Telephone Encounter (Signed)
Patient called re: Patient never received the upgraded Omnipod and supplies. Patient requests new RX for upgraded Omnipod and supplies be sent to: [facility] ([place]), [place] - [address] Phone:  [phone]  Fax:  [phone]

## 2021-04-18 ENCOUNTER — Encounter: Payer: Self-pay | Admitting: Radiology

## 2021-04-18 MED ORDER — OMNIPOD 5 DEXG7G6 INTRO GEN 5 KIT: 1.0000 | PACK | 0 refills | Status: DC

## 2021-04-18 NOTE — Addendum Note (Signed)
Addended by: Jefferson Fuel on: 04/18/2021 07:07 AM   Modules accepted: Orders

## 2021-04-18 NOTE — Telephone Encounter (Signed)
Script sent to [facility] per patient request

## 2021-04-24 ENCOUNTER — Emergency Department (HOSPITAL_COMMUNITY): Payer: Medicaid Other

## 2021-04-24 ENCOUNTER — Emergency Department (HOSPITAL_COMMUNITY)
Admission: EM | Admit: 2021-04-24 | Discharge: 2021-04-24 | Disposition: A | Discharge status: Home / Self Care | Payer: Medicaid Other | Attending: Emergency Medicine | Admitting: Emergency Medicine

## 2021-04-24 ENCOUNTER — Encounter (HOSPITAL_COMMUNITY): Payer: Self-pay

## 2021-04-24 ENCOUNTER — Other Ambulatory Visit: Payer: Self-pay

## 2021-04-24 DIAGNOSIS — R600 Localized edema: Secondary | ICD-10-CM | POA: Insufficient documentation

## 2021-04-24 DIAGNOSIS — Z794 Long term (current) use of insulin: Secondary | ICD-10-CM | POA: Insufficient documentation

## 2021-04-24 DIAGNOSIS — R000 Tachycardia, unspecified: Secondary | ICD-10-CM | POA: Diagnosis not present

## 2021-04-24 DIAGNOSIS — E1022 Type 1 diabetes mellitus with diabetic chronic kidney disease: Secondary | ICD-10-CM | POA: Diagnosis not present

## 2021-04-24 DIAGNOSIS — R4182 Altered mental status, unspecified: Secondary | ICD-10-CM | POA: Diagnosis present

## 2021-04-24 DIAGNOSIS — E10319 Type 1 diabetes mellitus with unspecified diabetic retinopathy without macular edema: Secondary | ICD-10-CM | POA: Diagnosis not present

## 2021-04-24 DIAGNOSIS — N1831 Chronic kidney disease, stage 3a: Secondary | ICD-10-CM | POA: Diagnosis not present

## 2021-04-24 DIAGNOSIS — Z8616 Personal history of COVID-19: Secondary | ICD-10-CM | POA: Diagnosis not present

## 2021-04-24 DIAGNOSIS — Z79899 Other long term (current) drug therapy: Secondary | ICD-10-CM | POA: Insufficient documentation

## 2021-04-24 DIAGNOSIS — N9489 Other specified conditions associated with female genital organs and menstrual cycle: Secondary | ICD-10-CM | POA: Insufficient documentation

## 2021-04-24 DIAGNOSIS — R801 Persistent proteinuria, unspecified: Secondary | ICD-10-CM

## 2021-04-24 DIAGNOSIS — R569 Unspecified convulsions: Secondary | ICD-10-CM

## 2021-04-24 LAB — RAPID URINE DRUG SCREEN, HOSP PERFORMED
Amphetamines: NOT DETECTED
Barbiturates: NOT DETECTED
Benzodiazepines: POSITIVE — AB
Cocaine: NOT DETECTED
Opiates: NOT DETECTED
Tetrahydrocannabinol: NOT DETECTED

## 2021-04-24 LAB — COMPREHENSIVE METABOLIC PANEL
ALT: 15 U/L (ref 0–44)
AST: 14 U/L — ABNORMAL LOW (ref 15–41)
Albumin: 2.1 g/dL — ABNORMAL LOW (ref 3.5–5.0)
Alkaline Phosphatase: 86 U/L (ref 38–126)
Anion gap: 4 — ABNORMAL LOW (ref 5–15)
BUN: 14 mg/dL (ref 6–20)
CO2: 21 mmol/L — ABNORMAL LOW (ref 22–32)
Calcium: 7.5 mg/dL — ABNORMAL LOW (ref 8.9–10.3)
Chloride: 116 mmol/L — ABNORMAL HIGH (ref 98–111)
Creatinine, Ser: 1.9 mg/dL — ABNORMAL HIGH (ref 0.44–1.00)
GFR, Estimated: 36 mL/min — ABNORMAL LOW (ref >60)
Glucose, Bld: 65 mg/dL — ABNORMAL LOW (ref 70–99)
Potassium: 3.6 mmol/L (ref 3.5–5.1)
Sodium: 141 mmol/L (ref 135–145)
Total Bilirubin: 0.5 mg/dL (ref 0.3–1.2)
Total Protein: 5.1 g/dL — ABNORMAL LOW (ref 6.5–8.1)

## 2021-04-24 LAB — CBG MONITORING, ED
Glucose-Capillary: 87 mg/dL (ref 70–99)
Glucose-Capillary: 85 mg/dL (ref 70–99)

## 2021-04-24 LAB — URINALYSIS, ROUTINE W REFLEX MICROSCOPIC
Bilirubin Urine: NEGATIVE
Glucose, UA: 150 mg/dL — AB
Ketones, ur: NEGATIVE mg/dL
Leukocytes,Ua: NEGATIVE
Nitrite: NEGATIVE
Protein, ur: >300 mg/dL — AB
Specific Gravity, Urine: 1.02 (ref 1.005–1.030)
pH: 6 (ref 5.0–8.0)

## 2021-04-24 LAB — BLOOD GAS, ARTERIAL
Acid-base deficit: 2.6 mmol/L — ABNORMAL HIGH (ref 0.0–2.0)
Bicarbonate: 21.8 mmol/L (ref 20.0–28.0)
Drawn by: 422461
FIO2: 21
O2 Saturation: 97.9 %
Patient temperature: 98.6
pCO2 arterial: 38.1 mmHg (ref 32.0–48.0)
pH, Arterial: 7.375 (ref 7.350–7.450)
pO2, Arterial: 98.7 mmHg (ref 83.0–108.0)

## 2021-04-24 LAB — CBC WITH DIFFERENTIAL/PLATELET
Abs Immature Granulocytes: 0.03 10*3/uL (ref 0.00–0.07)
Basophils Absolute: 0 10*3/uL (ref 0.0–0.1)
Basophils Relative: 1 %
Eosinophils Absolute: 0.2 10*3/uL (ref 0.0–0.5)
Eosinophils Relative: 3 %
HCT: 25.6 % — ABNORMAL LOW (ref 36.0–46.0)
Hemoglobin: 8.2 g/dL — ABNORMAL LOW (ref 12.0–15.0)
Immature Granulocytes: 1 %
Lymphocytes Relative: 23 %
Lymphs Abs: 1.5 10*3/uL (ref 0.7–4.0)
MCH: 27.5 pg (ref 26.0–34.0)
MCHC: 32 g/dL (ref 30.0–36.0)
MCV: 85.9 fL (ref 80.0–100.0)
Monocytes Absolute: 0.4 10*3/uL (ref 0.1–1.0)
Monocytes Relative: 6 %
Neutro Abs: 4.3 10*3/uL (ref 1.7–7.7)
Neutrophils Relative %: 66 %
Platelets: 195 10*3/uL (ref 150–400)
RBC: 2.98 MIL/uL — ABNORMAL LOW (ref 3.87–5.11)
RDW: 15.7 % — ABNORMAL HIGH (ref 11.5–15.5)
WBC: 6.5 10*3/uL (ref 4.0–10.5)
nRBC: 0 % (ref 0.0–0.2)

## 2021-04-24 LAB — APTT: aPTT: 26 seconds (ref 24–36)

## 2021-04-24 LAB — PROTIME-INR
INR: 1.1 (ref 0.8–1.2)
Prothrombin Time: 13.9 seconds (ref 11.4–15.2)

## 2021-04-24 LAB — AMMONIA: Ammonia: 37 umol/L — ABNORMAL HIGH (ref 9–35)

## 2021-04-24 LAB — BETA-HYDROXYBUTYRIC ACID: Beta-Hydroxybutyric Acid: 0.1 mmol/L (ref 0.05–0.27)

## 2021-04-24 LAB — HCG, QUANTITATIVE, PREGNANCY: hCG, Beta Chain, Quant, S: 1 m[IU]/mL (ref <5)

## 2021-04-24 LAB — ETHANOL: Alcohol, Ethyl (B): <10 mg/dL (ref <10)

## 2021-04-24 LAB — LACTIC ACID, PLASMA: Lactic Acid, Venous: 1.2 mmol/L (ref 0.5–1.9)

## 2021-04-24 MED ORDER — ACETAMINOPHEN 325 MG PO TABS
650.0000 mg | ORAL_TABLET | Freq: Once | ORAL | Status: AC
  Administered 2021-04-24: 08:00:00 650 mg via ORAL
  Filled 2021-04-24: qty 2

## 2021-04-24 MED ORDER — DEXTROSE 50 % IV SOLN
1.0000 | Freq: Once | INTRAVENOUS | Status: AC
  Administered 2021-04-24: 09:00:00 50 mL via INTRAVENOUS
  Filled 2021-04-24: qty 50

## 2021-04-24 NOTE — ED Triage Notes (Signed)
Pt. BIB [facility] post unwitnessed seizure. She was found unresponsive. Mom says that her face was swollen and she didn't go to sleep with a swollen face. Hx of diabetes EMS reports witnessed focal seizures en route. Given 2.5 midazolam in route.   EMS VS: CBG: 79 HR: 100 97% RA BP: 164/92

## 2021-04-24 NOTE — Discharge Instructions (Addendum)
He was seen here today for evaluation of your possible seizure.  Your blood sugar was found to be mildly low.  Your lab work did not indicate any evidence of DKA. Your creatinine was increased slightly and you have more protein in your urine than usual. Please follow up with your nephrologist for your worsening protein in your urine. Additionally, I have attached referral to see a neurologist.  Because this is most likely a seizure, according to the [facility], you are not to drive for the next 6 months.  If you have any slurred speech, syncope, seizure, fever, confusion, chest pain, shortness of breath, please return the nearest emergency department for reevaluation.

## 2021-04-24 NOTE — ED Notes (Signed)
US IV attempted x2, unsuccessful 

## 2021-04-24 NOTE — ED Provider Notes (Signed)
Physical Exam  There were no vitals taken for this visit.  Physical Exam Vitals and nursing note reviewed.  Constitutional:      Appearance: She is ill-appearing.     Comments: Answering some questions. She is oriented at this time saying she has bodyaches, no focal pain.   HENT:     Head:     Comments: Slight, symmetric swelling to face    Mouth/Throat:     Mouth: Mucous membranes are moist.    CT HEAD WO CONTRAST (5MM)  Result Date: 04/24/2021 CLINICAL DATA:  Altered mental status, found unresponsive EXAM: CT HEAD WITHOUT CONTRAST TECHNIQUE: Contiguous axial images were obtained from the base of the skull through the vertex without intravenous contrast. COMPARISON:  11/28/2018 FINDINGS: Brain: No evidence of acute infarction, hemorrhage, hydrocephalus, extra-axial collection or mass lesion/mass effect. Vascular: No hyperdense vessel or unexpected calcification. Skull: Normal. Negative for fracture or focal lesion. Sinuses/Orbits: The visualized paranasal sinuses are essentially clear. The mastoid air cells are unopacified. Other: None. IMPRESSION: Normal head CT. Electronically Signed   By: Julian Hy M.D.   On: 04/24/2021 07:18   DG Chest Port 1 View  Result Date: 04/24/2021 CLINICAL DATA:  Unwitnessed seizure EXAM: PORTABLE CHEST 1 VIEW COMPARISON:  04/09/2021 FINDINGS: Lungs are clear.  No pleural effusion or pneumothorax. The heart is normal in size. IMPRESSION: No evidence of acute cardiopulmonary disease. Electronically Signed   By: Julian Hy M.D.   On: 04/24/2021 06:52     Results for orders placed or performed during the hospital encounter of 04/24/21  Lactic acid, plasma  Result Value Ref Range   Lactic Acid, Venous 1.2 0.5 - 1.9 mmol/L  Comprehensive metabolic panel  Result Value Ref Range   Sodium 141 135 - 145 mmol/L   Potassium 3.6 3.5 - 5.1 mmol/L   Chloride 116 (H) 98 - 111 mmol/L   CO2 21 (L) 22 - 32 mmol/L   Glucose, Bld 65 (L) 70 - 99 mg/dL    BUN 14 6 - 20 mg/dL   Creatinine, Ser 1.90 (H) 0.44 - 1.00 mg/dL   Calcium 7.5 (L) 8.9 - 10.3 mg/dL   Total Protein 5.1 (L) 6.5 - 8.1 g/dL   Albumin 2.1 (L) 3.5 - 5.0 g/dL   AST 14 (L) 15 - 41 U/L   ALT 15 0 - 44 U/L   Alkaline Phosphatase 86 38 - 126 U/L   Total Bilirubin 0.5 0.3 - 1.2 mg/dL   GFR, Estimated 36 (L) >60 mL/min   Anion gap 4 (L) 5 - 15  CBC WITH DIFFERENTIAL  Result Value Ref Range   WBC 6.5 4.0 - 10.5 K/uL   RBC 2.98 (L) 3.87 - 5.11 MIL/uL   Hemoglobin 8.2 (L) 12.0 - 15.0 g/dL   HCT 25.6 (L) 36.0 - 46.0 %   MCV 85.9 80.0 - 100.0 fL   MCH 27.5 26.0 - 34.0 pg   MCHC 32.0 30.0 - 36.0 g/dL   RDW 15.7 (H) 11.5 - 15.5 %   Platelets 195 150 - 400 K/uL   nRBC 0.0 0.0 - 0.2 %   Neutrophils Relative % 66 %   Neutro Abs 4.3 1.7 - 7.7 K/uL   Lymphocytes Relative 23 %   Lymphs Abs 1.5 0.7 - 4.0 K/uL   Monocytes Relative 6 %   Monocytes Absolute 0.4 0.1 - 1.0 K/uL   Eosinophils Relative 3 %   Eosinophils Absolute 0.2 0.0 - 0.5 K/uL   Basophils Relative 1 %  Basophils Absolute 0.0 0.0 - 0.1 K/uL   Immature Granulocytes 1 %   Abs Immature Granulocytes 0.03 0.00 - 0.07 K/uL  Protime-INR  Result Value Ref Range   Prothrombin Time 13.9 11.4 - 15.2 seconds   INR 1.1 0.8 - 1.2  APTT  Result Value Ref Range   aPTT 26 24 - 36 seconds  Ethanol  Result Value Ref Range   Alcohol, Ethyl (B) <10 <10 mg/dL  Beta-hydroxybutyric acid  Result Value Ref Range   Beta-Hydroxybutyric Acid 0.10 0.05 - 0.27 mmol/L  Blood gas, arterial  Result Value Ref Range   FIO2 21.00    pH, Arterial 7.375 7.350 - 7.450   pCO2 arterial 38.1 32.0 - 48.0 mmHg   pO2, Arterial 98.7 83.0 - 108.0 mmHg   Bicarbonate 21.8 20.0 - 28.0 mmol/L   Acid-base deficit 2.6 (H) 0.0 - 2.0 mmol/L   O2 Saturation 97.9 %   Patient temperature 98.6    Collection site LEFT RADIAL    Drawn by [id number]    Allens test (pass/fail) PASS PASS  hCG, quantitative, pregnancy  Result Value Ref Range   hCG, Beta Chain,  Quant, S 1 <5 mIU/mL  Ammonia  Result Value Ref Range   Ammonia 37 (H) 9 - 35 umol/L  CBG monitoring, ED  Result Value Ref Range   Glucose-Capillary 87 70 - 99 mg/dL  CBG monitoring, ED  Result Value Ref Range   Glucose-Capillary 85 70 - 99 mg/dL    ED Course/Procedures   Clinical Course as of 04/24/21 0709  Mon Apr 24, 2021  0703 pH, Arterial: 7.375 WNL [HM]  0706 Glucose-Capillary: 87 Within normal limits [HM]    Clinical Course User Index [HM] Muthersbaugh, Jarrett Soho, PA-C    Procedures  MDM   Accepted handoff at shift change from [facility], PA-C. Please see prior provider note for more detail.   Briefly: Patient is 30 y.o. presents the emergency department via EMS for evaluation of unresponsiveness, possible seizures, and swelling.  Briefly, the patient is a type I diabetic that has been seen at [facility] multiple times for DKA the past month.  Today, mom heard her fall went upstairs and she was unresponsive.  EMS found that she was unresponsive as well.  CBG 79 for them.  Mom reports she has had seizures in the past with hypoglycemia, and her normal blood sugars are 100-150.  For EMS.  She had a possible left arm focal seizure with horizontal eye twitching.  2.5 mg of Versed were given then she was normal.  DDX: concern for DKA, uremia, renal failure, seizure  Plan: Workup for DKA and AMS placed. Awaiting labs.   CT of head was negative.  Chest x-ray shows no acute cardiopulmonary process.  CBC shows anemia.Which is slightly decreased from her hemoglobin of 8.4 12 days ago.  No leukocytosis.  PT/INR APTT normal.  Negative ethanol.  Beta hydroxybutyric acid negative.  hCG negative.  Lactic acid 1.2.  UDA positive for benzos however patient was given a benzo in route with EMS.  Ammonia is slightly increased at 37.  ABG within normal limits.  CMP shows mildly decreased bicarb.  Glucose decreased to 65.  Her creatinine has increased from 1.71-1.90 within 12 days.   Hypoalbuminemia.  Urine shows cloudy urine with small blood but greater than 300 protein.  Few bacteria with 21-50 white blood cells.  Patient denies any urinary symptoms.  Discussed these lab findings with my attending who recommended follow-up with nephrology outpatient.  Blood cultures obtained.  She was given Tylenol and an amp of D50 to improve her hypoglycemia.  Patient has continued to improve is alert and oriented x4.  She has no complaints other than right arm pain which is chronic for her.  At this time, she most likely had a seizure from her decreased blood sugar.  We will get follow-up with neurology I recommended she follow-up with her home nephrologist and let them know about her worsening creatinine and proteinuria from her nephrotic syndrome.  I stressed the importance of following with a nephrologist. I dicussed with her she would not be able to drive because of [place] law with recent seizure.  Strict return precautions discussed.  Patient agrees to plan.  Patient is stable being discharged home in good condition.  I discussed this case with my attending physician who cosigned this note including patient's presenting symptoms, physical exam, and planned diagnostics and interventions. Attending physician stated agreement with plan or made changes to plan which were implemented.   Attending physician assessed patient at bedside.    Sherrell Puller, PA-C 04/26/21 [place], MD 04/29/21 [phone]

## 2021-04-24 NOTE — ED Provider Notes (Signed)
I provided a substantive portion of the care of this patient.  I personally performed the entirety of the medical decision making for this encounter.  EKG Interpretation  Date/Time:  Monday April 24 2021 06:07:35 EST Ventricular Rate:  107 PR Interval:  119 QRS Duration: 65 QT Interval:  387 QTC Calculation: 517 R Axis:   56 Text Interpretation: Sinus tachycardia Prolonged QT interval Confirmed by Quintella Reichert [phone]) on 04/24/2021 7:03:45 AM    30 year old female presents unresponsive to likely hypoglycemia episodes with associated seizures.  On exam here she is awake and alert.  Blood sugars are soft.  Given for D50.  She does not take any antiepileptics.  Work-up is pending at this time.   Ashley Leigh, MD 04/24/21 [phone]

## 2021-04-24 NOTE — ED Notes (Signed)
An After Visit Summary was printed and given to the patient. Discharge instructions given and no further questions at this time.  Pt states family is taking her home.  

## 2021-04-24 NOTE — ED Provider Notes (Signed)
Ashley Freeman   CSN: [id number] Arrival date & time: 04/24/21  8938     History No chief complaint on file.   Ashley Freeman is a 30 y.o. female with a hx of insulin-dependent diabetes, type I, CKD, anasarca presents to the emergency department unresponsive after questionable seizure activity.  Level 5 caveat for altered mental status.  Per EMS, mother heard patient fall and was found unresponsive prompting the 911 call.  They report upon their arrival patient with a pulse and breathing spontaneously but fully unresponsive.  Blood sugar 79.  They report patient appeared postictal, began opening her eyes but was not answering questions or following commands.  In route to the hospital patient had what EMS considered focal seizure-like activity including drawing up of the left arm and rapid horizontal eye movement.  Patient was given 2.5 mg of Versed IV with improvement in left arm rigidity and eye movement the patient remained largely unresponsive.  Mother reported to EMS that patient's face was not swollen when she went to bed last night.  Upon arrival to the emergency department patient making more purposeful movements and beginning to answer questions.  Speech is slowed.  Records reviewed.  Patient discharged from [facility] on 12/20 after admission on 04/16/2021.  At that time she was found to have anasarca, worsening CKD.  Patient presented to the emergency department on 1218 with worsening anasarca.  She been started on p.o. Lasix at the time of her previous discharge but swelling it progressed.  Serum albumin was 1.8 and she was given IV albumin and Lasix with good response.  Admitted for further diuresis.  She was started on lisinopril uptitrated to 20 mg daily.  Transition to 20 mg twice daily of Lasix at discharge.  TEE while inpatient showed reduced EF to 40% from normal EF during recent hospitalization.  Amlodipine was  stopped and patient was switched to carvedilol.  She was discharged with instructions to follow-up with nephrology and obtain a new repeat full echocardiogram.  Additionally patient had recent diagnosis of cellulitis and C. difficile.  She is taking oral vancomycin for this.  Additional records show numerous admissions for DKA in November and December.  The history is provided by the EMS personnel, medical records and the patient. No language interpreter was used.      Past Medical History:  Diagnosis Date   Abscess, gluteal, right 08/24/2013   AKI (acute kidney injury) ([place]) 07/26/2014   Anemia 02/19/2012   Bartholin's gland abscess 09/19/2013   BV (bacterial vaginosis) 11/24/2015   Diabetes mellitus type I ([place]) 2001   Diagnosed at age 30 ; Type I   Diarrhea 05/30/2016   DKA (diabetic ketoacidoses) 08/19/2013   Also in 2018   Gonorrhea 08/2011   Treated in 09/2011   History of trichomoniasis 05/31/2016   Hyperlipidemia 03/28/2016   Sepsis ([place]) 09/19/2013    Patient Active Problem List   Diagnosis Date Noted   Demand ischemia ([place])    DKA (diabetic ketoacidosis) ([place]) 03/31/2021   COVID-19    Diabetic ketoacidosis without coma associated with type 1 diabetes mellitus ([place])    Hyperbilirubinemia 03/07/2021   Mild protein malnutrition ([place]) 03/07/2021   DKA, type 1 ([place]) 12/08/2020   AKI (acute kidney injury) ([place]) 09/17/2020   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 ([place]) 09/17/2020   Type 1 diabetes mellitus with stage 3a chronic kidney disease ([place]) 07/18/2020   History of pre-eclampsia 10/05/2019  Transaminitis 10/05/2019   Elevated serum creatinine 10/05/2019   Bilateral leg edema 87/57/9728   Systolic ejection murmur 20/60/1561   Shortness of breath 08/03/2019   Type 1 diabetes mellitus with hyperglycemia ([place]) 07/24/2019   Diabetic retinopathy ([place]) 07/02/2019   Proteinuria due to type 1 diabetes mellitus ([place]) 05/21/2019   Diabetic neuropathy ([place]) 01/16/2016    Chest pain of uncertain etiology 53/79/4327   Leukocytosis 02/19/2012   Normocytic anemia 02/19/2012   Diabetes mellitus type 1, uncontrolled, with complications 61/47/0929    Past Surgical History:  Procedure Laterality Date   CESAREAN SECTION N/A 10/05/2019   Procedure: CESAREAN SECTION;  Surgeon: Aletha Halim, MD;  Location: [facility];  Service: Obstetrics;  Laterality: N/A;   INCISION AND DRAINAGE ABSCESS Left 09/28/2019   Procedure: INCISION AND DRAINAGE VULVAR ABCESS;  Surgeon: [name], MD;  Location: [place];  Service: Gynecology;  Laterality: Left;   INCISION AND DRAINAGE PERIRECTAL ABSCESS Right 08/18/2013   Procedure: IRRIGATION AND DEBRIDEMENT GLUTEAL ABSCESS;  Surgeon: Ralene Ok, MD;  Location: [place];  Service: General;  Laterality: Right;   INCISION AND DRAINAGE PERIRECTAL ABSCESS Right 09/19/2013   Procedure: IRRIGATION AND DEBRIDEMENT RIGHT GLUTEAL AND LABIAL ABSCESSES;  Surgeon: Ralene Ok, MD;  Location: [place];  Service: General;  Laterality: Right;   INCISION AND DRAINAGE PERIRECTAL ABSCESS Right 09/24/2013   Procedure: IRRIGATION AND DEBRIDEMENT PERIRECTAL ABSCESS;  Surgeon: Gwenyth Ober, MD;  Location: [place];  Service: General;  Laterality: Right;     OB History     Gravida  2   Para  1   Term      Preterm  1   AB  1   Living  1      SAB  1   IAB      Ectopic      Multiple  0   Live Births  1           Family History  Problem Relation Age of Onset   Asthma Mother    Carpal tunnel syndrome Mother    Gout Father    Diabetes Paternal Grandmother    Anesthesia problems Neg Hx     Social History   Tobacco Use   Smoking status: Never   Smokeless tobacco: Never  Vaping Use   Vaping Use: Never used  Substance Use Topics   Alcohol use: Not Currently   Drug use: No    Home Medications Prior to Admission medications   Medication Sig Start Date End Date Taking? Authorizing Provider  albuterol (VENTOLIN HFA) 108 (90  Base) MCG/ACT inhaler Inhale 2 puffs into the lungs every 4 (four) hours as needed for wheezing or shortness of breath. 08/09/20   [provider]  amLODipine (NORVASC) 10 MG tablet Take 1 tablet (10 mg total) by mouth daily. 03/24/21 04/23/21  Richarda Osmond, MD  blood glucose meter kit and supplies KIT Dispense based on patient and insurance preference. Use up to four times daily as directed. (FOR ICD-9 250.00, 250.01). 05/25/17   Isla Pence, MD  Blood Pressure Monitoring (BLOOD PRESSURE KIT) DEVI 1 Device by Does not apply route as needed. 05/06/19   Anyanwu, Sallyanne Havers, MD  carbamide peroxide (DEBROX) 6.5 % OTIC solution Place 5 drops into both ears 2 (two) times daily. 04/14/21   Kayleen Memos, DO  Continuous Blood Gluc Sensor (DEXCOM G6 SENSOR) MISC Inject 1 Device into the skin as directed. 12/16/20   Shamleffer, Melanie Crazier, MD  Continuous Blood Gluc Transmit (DEXCOM G6 TRANSMITTER) MISC Inject 1 Device into the skin as directed. Use to check blood sugar daily 12/16/20   Shamleffer, Melanie Crazier, MD  famotidine (PEPCID) 20 MG tablet Take 1 tablet (20 mg total) by mouth 2 (two) times daily. 04/05/21 05/05/21  Alma Friendly, MD  ibuprofen (ADVIL) 800 MG tablet Take 1 tablet (800 mg total) by mouth 3 (three) times daily. Patient taking differently: Take 800 mg by mouth 3 (three) times daily as needed for fever, mild pain or headache. 04/10/21   [place] Donnamarie Poag, DO  insulin aspart (NOVOLOG) 100 UNIT/ML injection Inject 0-6 Units into the skin 3 (three) times daily with meals. Patient taking differently: Inject 0-6 Units into the skin 3 (three) times daily with meals. Sliding scale 04/10/21   [place] Donnamarie Poag, DO  Insulin Disposable Pump (OMNIPOD 5 G6 INTRO, GEN 5,) KIT 1 Device by Does not apply route every 3 (three) days. 04/18/21   Shamleffer, Melanie Crazier, MD  insulin glargine (LANTUS) 100 UNIT/ML injection Inject 0.1 mLs (10 Units total) into the skin daily. 04/14/21    Kayleen Memos, DO  Insulin Pen Needle 32G X 4 MM MISC Use as directed 10/07/20   Ghimire, Henreitta Leber, MD  ipratropium (ATROVENT HFA) 17 MCG/ACT inhaler Inhale 1 puff into the lungs every 6 (six) hours. 03/24/21   Richarda Osmond, MD  lamoTRIgine (LAMICTAL) 150 MG tablet Take 150 mg by mouth daily. 03/29/21   [provider]  saccharomyces boulardii (FLORASTOR) 250 MG capsule Take 1 capsule (250 mg total) by mouth 2 (two) times daily. 04/14/21 05/14/21  Kayleen Memos, DO  vancomycin (VANCOCIN) 125 MG capsule Take 1 capsule by mouth 4 times daily for 12 days. 04/14/21 04/26/21  Kayleen Memos, DO  Ferrous Sulfate (IRON) 325 (65 Fe) MG TABS Take 1 tablet (325 mg total) by mouth every other day. Patient not taking: Reported on 07/25/2020 06/17/19 07/25/20  Chauncey Mann, MD  hydrochlorothiazide (HYDRODIURIL) 25 MG tablet Take 1 tablet (25 mg total) by mouth daily. Patient not taking: No sig reported 10/08/19 07/25/20  Constant, Peggy, MD  promethazine (PHENERGAN) 25 MG tablet Take 1 tablet (25 mg total) by mouth every 8 (eight) hours as needed for nausea or vomiting (if zofran fails). Patient not taking: Reported on 10/06/2020 08/25/20 10/06/20  Varney Biles, MD    Allergies    Cephalexin, Penicillins, Benadryl [diphenhydramine], and Doxycycline  Review of Systems   Review of Systems  Unable to perform ROS: Mental status change   Physical Exam Updated Vital Signs There were no vitals taken for this visit.  Physical Exam Vitals and nursing Freeman reviewed.  Constitutional:      General: She is not in acute distress.    Appearance: She is ill-appearing. She is not diaphoretic.     Comments: Very lethargic.  Answer some questions.  Does follow commands at this time.  HENT:     Head: Normocephalic.     Comments: Significant swelling of the face    Mouth/Throat:     Mouth: Mucous membranes are dry.     Comments: Injury to the left side of the mouth Eyes:     General: No scleral  icterus.    Conjunctiva/sclera: Conjunctivae normal.     Pupils: Pupils are equal, round, and reactive to light.  Cardiovascular:     Rate and Rhythm: Regular rhythm. Tachycardia present.     Pulses: Normal pulses.  Radial pulses are 2+ on the right side and 2+ on the left side.  Pulmonary:     Effort: No tachypnea, accessory muscle usage, prolonged expiration, respiratory distress or retractions.     Breath sounds: Normal breath sounds. No stridor.     Comments: Equal chest rise. No increased work of breathing. Abdominal:     General: There is no distension.     Palpations: Abdomen is soft.     Tenderness: There is no abdominal tenderness. There is no guarding or rebound.  Musculoskeletal:     Cervical back: Normal range of motion.     Right lower leg: Edema present.     Left lower leg: Edema present.     Comments: Moves all extremities equally and without difficulty. Right upper extremity edema  Skin:    General: Skin is warm and dry.     Capillary Refill: Capillary refill takes less than 2 seconds.  Neurological:     Mental Status: She is lethargic.     GCS: GCS eye subscore is 4. GCS verbal subscore is 5. GCS motor subscore is 6.     Comments: Speech is clear and goal oriented.  Psychiatric:        Mood and Affect: Mood normal.    ED Results / Procedures / Treatments   Labs (all labs ordered are listed, but only abnormal results are displayed) Labs Reviewed  BLOOD GAS, ARTERIAL - Abnormal; Notable for the following components:      Result Value   Acid-base deficit 2.6 (*)    All other components within normal limits  CULTURE, BLOOD (ROUTINE X 2)  CULTURE, BLOOD (ROUTINE X 2)  LACTIC ACID, PLASMA  LACTIC ACID, PLASMA  COMPREHENSIVE METABOLIC PANEL  CBC WITH DIFFERENTIAL/PLATELET  PROTIME-INR  APTT  URINALYSIS, ROUTINE W REFLEX MICROSCOPIC  RAPID URINE DRUG SCREEN, HOSP PERFORMED  ETHANOL  BETA-HYDROXYBUTYRIC ACID  CBG MONITORING, ED  I-STAT BETA HCG  BLOOD, ED ([facility], [facility], [facility] ONLY)     Radiology DG Chest Port 1 View  Result Date: 04/24/2021 CLINICAL DATA:  Unwitnessed seizure EXAM: PORTABLE CHEST 1 VIEW COMPARISON:  04/09/2021 FINDINGS: Lungs are clear.  No pleural effusion or pneumothorax. The heart is normal in size. IMPRESSION: No evidence of acute cardiopulmonary disease. Electronically Signed   By: Julian Hy M.D.   On: 04/24/2021 06:52    Procedures .Critical Care Performed by: Abigail Butts, PA-C Authorized by: Abigail Butts, PA-C   Critical care provider statement:    Critical care time (minutes):  45   Critical care time was exclusive of:  Separately billable procedures and treating other patients and teaching time   Critical care was necessary to treat or prevent imminent or life-threatening deterioration of the following conditions:  CNS failure or compromise and endocrine crisis   Critical care was time spent personally by me on the following activities:  Development of treatment plan with patient or surrogate, discussions with consultants, evaluation of patient's response to treatment, examination of patient, ordering and review of laboratory studies, ordering and review of radiographic studies, ordering and performing treatments and interventions, pulse oximetry, re-evaluation of patient's condition and review of old charts   I assumed direction of critical care for this patient from another provider in my specialty: no     Care discussed with comment:  Attending MD   Medications Ordered in ED Medications - No data to display  ED Course  I have reviewed the triage vital signs and the nursing notes.  Pertinent labs & imaging results that were available during my care of the patient were reviewed by me and considered in my medical decision making (see chart for details).  Clinical Course as of 04/24/21 0711  Mon Apr 24, 2021  0703 pH, Arterial: 7.375 WNL [HM]  0706 Glucose-Capillary: 87 Within  normal limits [HM]    Clinical Course User Index [HM] Bostyn Bogie, Gwenlyn Perking   MDM Rules/Calculators/A&P                          Patient presents critically ill with altered mental status.  Questionable seizure activity.  Patient protecting her airway and answering some questions but remains lethargic.  Sugar 87.  Less likely DKA.  Patient with recent hospitalization for CKD, anasarca.  History of sepsis and currently on vancomycin for C. difficile.  Unclear etiology for altered mental status today.  Patient denies drug usage.  Differential includes seizure activity, DKA, uremia, encephalitis.  Broad work-up initiated.  7:05 AM At shift change care was transferred to [facility] who will follow pending studies, re-evaulate and determine disposition.         Final Clinical Impression(s) / ED Diagnoses Final diagnoses:  Altered mental status, unspecified altered mental status type    Rx / DC Orders ED Discharge Orders     None        Omolola Mittman, Gwenlyn Perking 04/24/21 0165    Quintella Reichert, MD 04/24/21 [phone]

## 2021-04-29 LAB — CULTURE, BLOOD (ROUTINE X 2)
Culture: NO GROWTH
Special Requests: ADEQUATE

## 2021-05-11 ENCOUNTER — Ambulatory Visit: Payer: Medicaid Other | Admitting: Internal Medicine

## 2021-05-11 NOTE — Progress Notes (Deleted)
Name: Ashley Freeman  Age/ Sex: 31 y.o., female   MRN/ DOB: [id number], [birth year]     PCP: System, Provider Not In   Reason for Endocrinology Evaluation: Type 2 Diabetes Mellitus  Initial Endocrine Consultative Visit: 06/27/18    PATIENT IDENTIFIER: [name] is a 31 y.o. female with a past medical history of T1DM with multiple DKA's. The patient has followed with Endocrinology clinic since 06/27/2018 for consultative assistance with management of her diabetes.  DIABETIC HISTORY:  [name] was diagnosed with T1DM at age 73. Hx of multiple DKA's due to non-compliance. Her hemoglobin A1c has ranged from  in 11.3% in 2011, peaking at 19.3% in 2015.  S/P C-section 10/2019 ( Ashley Freeman)  SUBJECTIVE:   During the last visit (07/15/2020): A1c 9.4 % .  We adjusted pump settings   Today (05/11/2021): [name] is here for a follow up on diabetes management .  She checks glucose   She continues with non-compliance with glucose checks and medication adherence   She presented to the ED with DKA  at least monthly for the past 4 months  with the last one last week   She has been recently diagnosed with bipolar disorder and post-partum depression    Denies nausea or diarrhea   HOME DIABETES REGIMEN:   Novolog     This patient with type 1 diabetes is treated with Omnipod (insulin pump). During the visit the pump basal and bolus doses were reviewed including carb/insulin rations and supplemental doses. The clinical list was updated. The glucose meter download was reviewed in detail to determine if the current pump settings are providing the best glycemic control without excessive hypoglycemia.  Pump and meter download:   Pump   Omnipod Settings   Basal rate       0000-0800 0.50   0800- 0000 0.70          I:C ratio       0000 15          Sensitivity       0000  65      Goal       0000  110         Type &  Model of Pump: Omnipod Insulin Type: Currently using Novolog.  There is no height or weight on file to calculate BMI.  PUMP STATISTICS: Average BG: 229 Average Daily Carbs (g):84 Average Total Daily Insulin: 14.6 Average Daily Basal: 9.9 u (78 %) Average Daily Bolus: 2.7(22%)     DIABETIC COMPLICATIONS: Microvascular complications:   Denies: neuropathy, retinopathy, CKD Last eye exam: Completed 02/2020    Macrovascular complications:    Denies: CAD, PVD, CVA  HISTORY:  Past Medical History:  Past Medical History:  Diagnosis Date   Abscess, gluteal, right 08/24/2013   AKI (acute kidney injury) ([place]) 07/26/2014   Anemia 02/19/2012   Bartholin's gland abscess 09/19/2013   BV (bacterial vaginosis) 11/24/2015   Diabetes mellitus type I ([place]) 2001   Diagnosed at age 89 ; Type I   Diarrhea 05/30/2016   DKA (diabetic ketoacidoses) 08/19/2013   Also in 2018   Gonorrhea 08/2011   Treated in 09/2011   History of trichomoniasis 05/31/2016   Hyperlipidemia 03/28/2016   Sepsis ([place]) 09/19/2013   Past Surgical History:  Past Surgical History:  Procedure Laterality Date   CESAREAN SECTION N/A 10/05/2019   Procedure: CESAREAN SECTION;  Surgeon: Aletha Halim, MD;  Location: [facility]  Name: Ashley Freeman  Age/ Sex: 31 y.o., female   MRN/ DOB: [id number], [birth year]     PCP: System, Provider Not In   Reason for Endocrinology Evaluation: Type 2 Diabetes Mellitus  Initial Endocrine Consultative Visit: 06/27/18    PATIENT IDENTIFIER: [name] is a 31 y.o. female with a past medical history of T1DM with multiple DKA's. The patient has followed with Endocrinology clinic since 06/27/2018 for consultative assistance with management of her diabetes.  DIABETIC HISTORY:  [name] was diagnosed with T1DM at age 73. Hx of multiple DKA's due to non-compliance. Her hemoglobin A1c has ranged from  in 11.3% in 2011, peaking at 19.3% in 2015.  S/P C-section 10/2019 ( Ashley Freeman)  SUBJECTIVE:   During the last visit (07/15/2020): A1c 9.4 % .  We adjusted pump settings   Today (05/11/2021): [name] is here for a follow up on diabetes management .  She checks glucose   She continues with non-compliance with glucose checks and medication adherence   She presented to the ED with DKA  at least monthly for the past 4 months  with the last one last week   She has been recently diagnosed with bipolar disorder and post-partum depression    Denies nausea or diarrhea   HOME DIABETES REGIMEN:   Novolog     This patient with type 1 diabetes is treated with Omnipod (insulin pump). During the visit the pump basal and bolus doses were reviewed including carb/insulin rations and supplemental doses. The clinical list was updated. The glucose meter download was reviewed in detail to determine if the current pump settings are providing the best glycemic control without excessive hypoglycemia.  Pump and meter download:   Pump   Omnipod Settings   Basal rate       0000-0800 0.50   0800- 0000 0.70          I:C ratio       0000 15          Sensitivity       0000  65      Goal       0000  110         Type &  Model of Pump: Omnipod Insulin Type: Currently using Novolog.  There is no height or weight on file to calculate BMI.  PUMP STATISTICS: Average BG: 229 Average Daily Carbs (g):84 Average Total Daily Insulin: 14.6 Average Daily Basal: 9.9 u (78 %) Average Daily Bolus: 2.7(22%)     DIABETIC COMPLICATIONS: Microvascular complications:   Denies: neuropathy, retinopathy, CKD Last eye exam: Completed 02/2020    Macrovascular complications:    Denies: CAD, PVD, CVA  HISTORY:  Past Medical History:  Past Medical History:  Diagnosis Date   Abscess, gluteal, right 08/24/2013   AKI (acute kidney injury) ([place]) 07/26/2014   Anemia 02/19/2012   Bartholin's gland abscess 09/19/2013   BV (bacterial vaginosis) 11/24/2015   Diabetes mellitus type I ([place]) 2001   Diagnosed at age 89 ; Type I   Diarrhea 05/30/2016   DKA (diabetic ketoacidoses) 08/19/2013   Also in 2018   Gonorrhea 08/2011   Treated in 09/2011   History of trichomoniasis 05/31/2016   Hyperlipidemia 03/28/2016   Sepsis ([place]) 09/19/2013   Past Surgical History:  Past Surgical History:  Procedure Laterality Date   CESAREAN SECTION N/A 10/05/2019   Procedure: CESAREAN SECTION;  Surgeon: Aletha Halim, MD;  Location: [facility]  Name: Ashley Freeman  Age/ Sex: 31 y.o., female   MRN/ DOB: [id number], [birth year]     PCP: System, Provider Not In   Reason for Endocrinology Evaluation: Type 2 Diabetes Mellitus  Initial Endocrine Consultative Visit: 06/27/18    PATIENT IDENTIFIER: [name] is a 31 y.o. female with a past medical history of T1DM with multiple DKA's. The patient has followed with Endocrinology clinic since 06/27/2018 for consultative assistance with management of her diabetes.  DIABETIC HISTORY:  [name] was diagnosed with T1DM at age 73. Hx of multiple DKA's due to non-compliance. Her hemoglobin A1c has ranged from  in 11.3% in 2011, peaking at 19.3% in 2015.  S/P C-section 10/2019 ( Ashley Freeman)  SUBJECTIVE:   During the last visit (07/15/2020): A1c 9.4 % .  We adjusted pump settings   Today (05/11/2021): [name] is here for a follow up on diabetes management .  She checks glucose   She continues with non-compliance with glucose checks and medication adherence   She presented to the ED with DKA  at least monthly for the past 4 months  with the last one last week   She has been recently diagnosed with bipolar disorder and post-partum depression    Denies nausea or diarrhea   HOME DIABETES REGIMEN:   Novolog     This patient with type 1 diabetes is treated with Omnipod (insulin pump). During the visit the pump basal and bolus doses were reviewed including carb/insulin rations and supplemental doses. The clinical list was updated. The glucose meter download was reviewed in detail to determine if the current pump settings are providing the best glycemic control without excessive hypoglycemia.  Pump and meter download:   Pump   Omnipod Settings   Basal rate       0000-0800 0.50   0800- 0000 0.70          I:C ratio       0000 15          Sensitivity       0000  65      Goal       0000  110         Type &  Model of Pump: Omnipod Insulin Type: Currently using Novolog.  There is no height or weight on file to calculate BMI.  PUMP STATISTICS: Average BG: 229 Average Daily Carbs (g):84 Average Total Daily Insulin: 14.6 Average Daily Basal: 9.9 u (78 %) Average Daily Bolus: 2.7(22%)     DIABETIC COMPLICATIONS: Microvascular complications:   Denies: neuropathy, retinopathy, CKD Last eye exam: Completed 02/2020    Macrovascular complications:    Denies: CAD, PVD, CVA  HISTORY:  Past Medical History:  Past Medical History:  Diagnosis Date   Abscess, gluteal, right 08/24/2013   AKI (acute kidney injury) ([place]) 07/26/2014   Anemia 02/19/2012   Bartholin's gland abscess 09/19/2013   BV (bacterial vaginosis) 11/24/2015   Diabetes mellitus type I ([place]) 2001   Diagnosed at age 89 ; Type I   Diarrhea 05/30/2016   DKA (diabetic ketoacidoses) 08/19/2013   Also in 2018   Gonorrhea 08/2011   Treated in 09/2011   History of trichomoniasis 05/31/2016   Hyperlipidemia 03/28/2016   Sepsis ([place]) 09/19/2013   Past Surgical History:  Past Surgical History:  Procedure Laterality Date   CESAREAN SECTION N/A 10/05/2019   Procedure: CESAREAN SECTION;  Surgeon: Aletha Halim, MD;  Location: [facility]  Name: Ashley Freeman  Age/ Sex: 31 y.o., female   MRN/ DOB: [id number], [birth year]     PCP: System, Provider Not In   Reason for Endocrinology Evaluation: Type 2 Diabetes Mellitus  Initial Endocrine Consultative Visit: 06/27/18    PATIENT IDENTIFIER: [name] is a 31 y.o. female with a past medical history of T1DM with multiple DKA's. The patient has followed with Endocrinology clinic since 06/27/2018 for consultative assistance with management of her diabetes.  DIABETIC HISTORY:  [name] was diagnosed with T1DM at age 73. Hx of multiple DKA's due to non-compliance. Her hemoglobin A1c has ranged from  in 11.3% in 2011, peaking at 19.3% in 2015.  S/P C-section 10/2019 ( Ashley Freeman)  SUBJECTIVE:   During the last visit (07/15/2020): A1c 9.4 % .  We adjusted pump settings   Today (05/11/2021): [name] is here for a follow up on diabetes management .  She checks glucose   She continues with non-compliance with glucose checks and medication adherence   She presented to the ED with DKA  at least monthly for the past 4 months  with the last one last week   She has been recently diagnosed with bipolar disorder and post-partum depression    Denies nausea or diarrhea   HOME DIABETES REGIMEN:   Novolog     This patient with type 1 diabetes is treated with Omnipod (insulin pump). During the visit the pump basal and bolus doses were reviewed including carb/insulin rations and supplemental doses. The clinical list was updated. The glucose meter download was reviewed in detail to determine if the current pump settings are providing the best glycemic control without excessive hypoglycemia.  Pump and meter download:   Pump   Omnipod Settings   Basal rate       0000-0800 0.50   0800- 0000 0.70          I:C ratio       0000 15          Sensitivity       0000  65      Goal       0000  110         Type &  Model of Pump: Omnipod Insulin Type: Currently using Novolog.  There is no height or weight on file to calculate BMI.  PUMP STATISTICS: Average BG: 229 Average Daily Carbs (g):84 Average Total Daily Insulin: 14.6 Average Daily Basal: 9.9 u (78 %) Average Daily Bolus: 2.7(22%)     DIABETIC COMPLICATIONS: Microvascular complications:   Denies: neuropathy, retinopathy, CKD Last eye exam: Completed 02/2020    Macrovascular complications:    Denies: CAD, PVD, CVA  HISTORY:  Past Medical History:  Past Medical History:  Diagnosis Date   Abscess, gluteal, right 08/24/2013   AKI (acute kidney injury) ([place]) 07/26/2014   Anemia 02/19/2012   Bartholin's gland abscess 09/19/2013   BV (bacterial vaginosis) 11/24/2015   Diabetes mellitus type I ([place]) 2001   Diagnosed at age 89 ; Type I   Diarrhea 05/30/2016   DKA (diabetic ketoacidoses) 08/19/2013   Also in 2018   Gonorrhea 08/2011   Treated in 09/2011   History of trichomoniasis 05/31/2016   Hyperlipidemia 03/28/2016   Sepsis ([place]) 09/19/2013   Past Surgical History:  Past Surgical History:  Procedure Laterality Date   CESAREAN SECTION N/A 10/05/2019   Procedure: CESAREAN SECTION;  Surgeon: Aletha Halim, MD;  Location: [facility]

## 2021-05-24 ENCOUNTER — Other Ambulatory Visit: Payer: Self-pay

## 2021-05-24 ENCOUNTER — Encounter: Payer: Self-pay | Admitting: Internal Medicine

## 2021-05-24 ENCOUNTER — Ambulatory Visit (INDEPENDENT_AMBULATORY_CARE_PROVIDER_SITE_OTHER): Payer: Medicaid Other | Admitting: Internal Medicine

## 2021-05-24 VITALS — BP 122/80 | HR 93 | Ht 63.0 in | Wt 114.6 lb

## 2021-05-24 DIAGNOSIS — E785 Hyperlipidemia, unspecified: Secondary | ICD-10-CM

## 2021-05-24 DIAGNOSIS — E1065 Type 1 diabetes mellitus with hyperglycemia: Secondary | ICD-10-CM | POA: Diagnosis not present

## 2021-05-24 DIAGNOSIS — N184 Chronic kidney disease, stage 4 (severe): Secondary | ICD-10-CM | POA: Diagnosis not present

## 2021-05-24 DIAGNOSIS — E1022 Type 1 diabetes mellitus with diabetic chronic kidney disease: Secondary | ICD-10-CM

## 2021-05-24 LAB — POCT GLYCOSYLATED HEMOGLOBIN (HGB A1C): Hemoglobin A1C: 8.3 % — AB (ref 4.0–5.6)

## 2021-05-24 MED ORDER — OMNIPOD 5 DEXG7G6 INTRO GEN 5 KIT: 1.0000 | PACK | 0 refills | Status: DC

## 2021-05-24 MED ORDER — OMNIPOD 5 DEXG7G6 PODS GEN 5 MISC: 1.0000 | 3 refills | Status: DC

## 2021-05-24 NOTE — Progress Notes (Signed)
Name: Ashley Freeman  Age/ Sex: 31 y.o., female   MRN/ DOB: [id number], Jan 14, 1991     PCP: System, Provider Not In   Reason for Endocrinology Evaluation: Type 2 Diabetes Mellitus  Initial Endocrine Consultative Visit: 06/27/18    Ashley Freeman IDENTIFIER: Ashley Freeman is a 32 y.o. 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 The Ashley Freeman has followed with Endocrinology clinic since 06/27/2018 for consultative assistance with management of her diabetes.  DIABETIC HISTORY:  Ashley Freeman was diagnosed with T1DM at age 49. Hx of multiple DKA's due to non-compliance. Her hemoglobin A1c has ranged from  in 11.3% in 2011, peaking at 19.3% in 2015.  S/P C-section 10/2019 ( Ashley Freeman)  SUBJECTIVE:   During the last visit (07/15/2020): A1c 9.4 % .  We adjusted pump settings   Today (05/24/2021): Ashley Freeman is here for a follow up on diabetes management .  She checks glucose     She presented to the ED with AMS and hypoglycemia in 12/20222 and multiple DKA's   ASPN has been trying to contact her regarding Omnipod , she has been without it  since early December 2022  She is awaiting on Omnipod 5   Denies nausea or diarrhea     HOME DIABETES REGIMEN:  Lantus 10 units  Novolog        CONTINUOUS GLUCOSE MONITORING RECORD INTERPRETATION    Dates of Recording: 1/12-1/25/2023  Sensor description: dexcom  Results statistics:   CGM use % of time 86  Average and SD 195/92  Time in range     51   %  % Time Above 180 23  % Time above 250 24  % Time Below target <1     Glycemic patterns summary:  hyperglycemia during the day   Hyperglycemic episodes  postprandial   Hypoglycemic episodes occurred variable without a pattern   Overnight periods: trends down          DIABETIC COMPLICATIONS: Microvascular complications:   Denies: neuropathy,  retinopathy, CKD Last eye exam: Completed 02/2020    Macrovascular complications:    Denies: CAD, PVD, CVA  HISTORY:  Past Medical History:  Past Medical History:  Diagnosis Date   Abscess, gluteal, right 08/24/2013   AKI (acute kidney injury) (HCC) 07/26/2014   Anemia 02/19/2012   Bartholin's gland abscess 09/19/2013   BV (bacterial vaginosis) 11/24/2015   Diabetes mellitus type I (HCC) 2001   Diagnosed at age 50 ; Type I   Diarrhea 05/30/2016   DKA (diabetic ketoacidoses) 08/19/2013   Also in 2018   Gonorrhea 08/2011   Treated in 09/2011   History of trichomoniasis 05/31/2016   Hyperlipidemia 03/28/2016   Sepsis (HCC) 09/19/2013   Past Surgical History:  Past Surgical History:  Procedure Laterality Date   CESAREAN SECTION N/A 10/05/2019   Procedure: CESAREAN SECTION;  Surgeon: [place] Miguel Bing, MD;  [facility];  Service: Obstetrics;  Laterality: N/A;   INCISION AND DRAINAGE ABSCESS Left 09/28/2019   Procedure: INCISION AND DRAINAGE VULVAR ABCESS;  Surgeon: Tilda Burrow, MD;  Location: [facility] OR;  Service: Gynecology;  Laterality: Left;   INCISION AND DRAINAGE PERIRECTAL ABSCESS Right 08/18/2013   Procedure: IRRIGATION AND DEBRIDEMENT GLUTEAL ABSCESS;  Surgeon: Axel Filler, MD;  Location: [facility] OR;  Service: General;  Laterality: Right;   INCISION AND DRAINAGE PERIRECTAL ABSCESS Right 09/19/2013   Procedure: IRRIGATION AND DEBRIDEMENT  RIGHT GLUTEAL AND LABIAL ABSCESSES;  Surgeon: Axel Filler, MD;  Location: [facility] OR;  Service: General;  Laterality: Right;   INCISION AND DRAINAGE PERIRECTAL ABSCESS Right 09/24/2013   Procedure: IRRIGATION AND DEBRIDEMENT PERIRECTAL ABSCESS;  Surgeon: Cherylynn Ridges, MD;  Location: [facility] OR;  Service: General;  Laterality: Right;   Social History:  reports that she has never smoked. She has never used smokeless tobacco. She reports that she does not currently use alcohol. She reports that she does not use drugs. Family History:  Family History   Problem Relation Age of Onset   Asthma Mother    Carpal tunnel syndrome Mother    Gout Father    Diabetes Paternal Grandmother    Anesthesia problems Neg Hx      HOME MEDICATIONS: Allergies as of 05/24/2021       Reactions   Cephalexin Anaphylaxis   Has gotten ceftriaxone in the past    Penicillins Hives, Rash   Has Ashley Freeman had a PCN reaction causing immediate rash, facial/tongue/throat swelling, SOB or lightheadedness with hypotension: Yes Has Ashley Freeman had a PCN reaction causing severe rash involving mucus membranes or skin necrosis: No Has Ashley Freeman had a PCN reaction that required hospitalization: Yes Has Ashley Freeman had a PCN reaction occurring within the last 10 years: No Spoke with pt - childhood hives told by mom, tried no pcns since, doesn't remember reaction herself    Benadryl [diphenhydramine] Itching   Doxycycline Itching        Medication List        Accurate as of May 24, 2021  4:39 PM. If you have any questions, ask your nurse or doctor.          albuterol 108 (90 Base) MCG/ACT inhaler Commonly known as: VENTOLIN HFA Inhale 2 puffs into the lungs every 4 (four) hours as needed for wheezing or shortness of breath.   amLODipine 10 MG tablet Commonly known as: NORVASC Take 1 tablet (10 mg total) by mouth daily.   blood glucose meter kit and supplies Kit Dispense based on Ashley Freeman and insurance preference. Use up to four times daily as directed. (FOR ICD-9 250.00, 250.01).   Blood Pressure Kit Devi 1 Device by Does not apply route as needed.   carbamide peroxide 6.5 % OTIC solution Commonly known as: DEBROX Place 5 drops into both ears 2 (two) times daily.   carvedilol 6.25 MG tablet Commonly known as: COREG Take 6.25 mg by mouth 2 (two) times daily.   Dexcom G6 Sensor Misc Inject 1 Device into the skin as directed.   Dexcom G6 Transmitter Misc Inject 1 Device into the skin as directed. Use to check blood sugar daily   famotidine 20 MG  tablet Commonly known as: PEPCID Take 1 tablet (20 mg total) by mouth 2 (two) times daily.   furosemide 20 MG tablet Commonly known as: LASIX Take 20 mg by mouth 2 (two) times daily.   ibuprofen 800 MG tablet Commonly known as: ADVIL Take 1 tablet (800 mg total) by mouth 3 (three) times daily. What changed:  when to take this reasons to take this   insulin aspart 100 UNIT/ML injection Commonly known as: novoLOG Inject 0-6 Units into the skin 3 (three) times daily with meals. What changed: additional instructions   ipratropium 17 MCG/ACT inhaler Commonly known as: ATROVENT HFA Inhale 1 puff into the lungs every 6 (six) hours.   lamoTRIgine 150 MG tablet Commonly known as: LAMICTAL Take 150 mg by mouth daily.  Lantus 100 UNIT/ML injection Generic drug: insulin glargine Inject 0.1 mLs (10 Units total) into the skin daily.   Latuda 40 MG Tabs tablet Generic drug: lurasidone Take 40 mg by mouth at bedtime.   lisinopril 10 MG tablet Commonly known as: ZESTRIL Take 10 mg by mouth 2 (two) times daily.   Omnipod 5 G6 Intro (Gen 5) Kit 1 Device by Does not apply route every 3 (three) days. What changed: Another medication with the same name was added. Make sure you understand how and when to take each. Changed by: Scarlette Shorts, MD   Omnipod 5 G6 Pod (Gen 5) Misc 1 Device by Does not apply route every 3 (three) days. What changed: You were already taking a medication with the same name, and this prescription was added. Make sure you understand how and when to take each. Changed by: Scarlette Shorts, MD   Pentips 32G X 4 MM Misc Generic drug: Insulin Pen Needle Use as directed         OBJECTIVE:   PHYSICAL EXAM: VS:BP 122/80 (BP Location: Left Arm, Ashley Freeman Position: Sitting, Cuff Size: Small)    Pulse 93    Ht 5\' 3"  (1.6 m)    Wt 114 lb 9.6 oz (52 kg)    SpO2 99%    BMI 20.30 kg/m   BP 122/80 (BP Location: Left Arm, Ashley Freeman Position: Sitting, Cuff  Size: Small)    Pulse 93    Ht 5\' 3"  (1.6 m)    Wt 114 lb 9.6 oz (52 kg)    SpO2 99%    BMI 20.30 kg/m    EXAM: General: Pt appears well and is in NAD  Lungs: Clear with good BS bilat with no rales, rhonchi, or wheezes  Heart: RRR  Abdomen:   soft and non-tender  Extremities: No pretibial edema   Mental Status: Judgment, insight: intact Mood and affect: no depression, anxiety, or agitation     DM foot exam: 07/15/2020 The skin of the feet is intact without sores or ulcerations. The pedal pulses are 2+ on right and 2+ on left. The sensation is intact to a screening 5.07, 10 gram monofilament bilaterally     DATA REVIEWED:  Lab Results  Component Value Date   HGBA1C 8.3 (A) 05/24/2021   HGBA1C 10.5 (H) 03/07/2021   HGBA1C 13.0 (H) 12/08/2020    Latest Reference Range & Units 05/24/21 15:52  Sodium 135 - 145 mEq/L 142  Potassium 3.5 - 5.1 mEq/L 2.9 (L)  Chloride 96 - 112 mEq/L 120 (H)  CO2 19 - 32 mEq/L 12 (L)  Glucose 70 - 99 mg/dL 324 (H)  BUN 6 - 23 mg/dL 24 (H)  Creatinine 4.01 - 1.20 mg/dL 0.27 (H)  Calcium 8.4 - 10.5 mg/dL 8.4  GFR >25.36 mL/min 27.98 (L)     Latest Reference Range & Units 05/24/21 15:52  Total CHOL/HDL Ratio  4  Cholesterol 0 - 200 mg/dL 644 (H)  HDL Cholesterol >39.00 mg/dL 03.47  LDL (calc) 0 - 99 mg/dL 425 (H)  MICROALB/CREAT RATIO 0.0 - 30.0 mg/g 661.4 (H)  NonHDL  180.38  Triglycerides 0.0 - 149.0 mg/dL 956.3 (H)  VLDL 0.0 - 87.5 mg/dL 64.3     Latest Reference Range & Units 05/24/21 15:52  Creatinine,U mg/dL 32.9  Microalb, Ur 0.0 - 1.9 mg/dL 518.8 (H)  MICROALB/CREAT RATIO 0.0 - 30.0 mg/g 661.4 (H)      ASSESSMENT / PLAN / RECOMMENDATIONS:   1) Type 1 Diabetes  Mellitus, Poorly  controlled, With CKD III - Most recent A1c of 8.4 %. Goal A1c < 7.0 %.    -Her A1c has improved but it continues to be above goal -The Ashley Freeman has had interruption of care, she is to be on insulin pump but it had broke and despite prescribing OmniPod 5  the Ashley Freeman has not been able to get it yet, the DME supplier company has been trying to reach her which has resulted in delay -She is currently on basal/prandial regimen -I have referred her to our CDE for proper retraining on OmniPod pump -I have asked her to download " carbs to go" and to enter carbohydrates each time she eats -In review of examples of certain meals and how much she would give NovoLog it appears that she has been doing 1 unit for every 6 g which is tight.  I will adjust that as below -BMP show elevated chloride and low CO2 but her anion gap is normal and I suspect this has to do with poor renal function  MEDICATIONS:   Change Novolog 1:10 grams  Continue Lantus 10 units  Correction Factor : Novolog (BG-130/60)      EDUCATION / INSTRUCTIONS: BG monitoring instructions: Ashley Freeman is instructed to check her blood sugars 4 times a day, before meals and bedtime  Call [facility] if: BG persistently < 70 I reviewed the Rule of 15 for the treatment of hypoglycemia in detail with the Ashley Freeman. Literature supplied.   2) Diabetic complications:  Eye: Does not have known diabetic retinopathy.  Neuro/ Feet: Does not have known diabetic peripheral neuropathy. Renal: Ashley Freeman does not have known baseline CKD. She is not on an ACEI/ARB at present.but has microalbuminuria, has a pending referral to nephrology per pt.    3) CKD IV  - GFR continues to deteriorate, renal function deteriorated since pregnancy in 2021 . She missed her appointment with nephrology and had requested a new referral which I have placed in 11/2020, the Ashley Freeman has not made yet to nephrology.  I am going to follow-up on that -In the meantime I will double up on her lisinopril   Medication Stop lisinopril 10 mg twice daily Start lisinopril 40 mg once daily   4) Dyslipidemia:  -This has been trending down -We will continue to monitor, I am not going to start him on statin therapy at this  time but will encourage low-fat diet   F/U in 4 months      Signed electronically by: Lyndle Herrlich, MD  [facility] Endocrinology  [facility] [address] [place] [place], [place] [address] Phone: [phone] FAX: [phone]   CC: System, Provider Not In No address on file Phone: None  Fax: None  Return to Endocrinology clinic as below: Future Appointments  Date Time Provider Department Center  06/08/2021  8:50 AM Cannon Kettle, PA-C [facility]  09/20/2021  1:00 PM Jamarrion Budai, Konrad Dolores, MD [facility] None

## 2021-05-24 NOTE — Patient Instructions (Signed)
-   Continue Lantus 10 units daily  - Novolog one unit for every 30 grams of carbohydrates ( total amount of carbohydrates % 10 = novolog dose -Novolog correctional insulin: ADD extra units on insulin to your meal-time Novolog dose if your blood sugars are higher than 190. Use the scale below to help guide you:   Blood sugar before meal Number of units to inject  Less than 190 0 unit  191 -  250 1 units  251 -  310 2 units  311 -  370 3 units  371 -  430 4 units  431 -  490 5 units  491 -  550 6 units   HOW TO TREAT LOW BLOOD SUGARS (Blood sugar LESS THAN 70 MG/DL) Please follow the RULE OF 15 for the treatment of hypoglycemia treatment (when your (blood sugars are less than 70 mg/dL)   STEP 1: Take 15 grams of carbohydrates when your blood sugar is low, which includes:  3-4 GLUCOSE TABS  OR 3-4 OZ OF JUICE OR REGULAR SODA OR ONE TUBE OF GLUCOSE GEL    STEP 2: RECHECK blood sugar in 15 MINUTES STEP 3: If your blood sugar is still low at the 15 minute recheck --> then, go back to STEP 1 and treat AGAIN with another 15 grams of carbohydrates.

## 2021-05-25 LAB — BASIC METABOLIC PANEL
BUN: 24 mg/dL — ABNORMAL HIGH (ref 6–23)
CO2: 12 mEq/L — ABNORMAL LOW (ref 19–32)
Calcium: 8.4 mg/dL (ref 8.4–10.5)
Chloride: 120 mEq/L — ABNORMAL HIGH (ref 96–112)
Creatinine, Ser: 2.31 mg/dL — ABNORMAL HIGH (ref 0.40–1.20)
GFR: 27.98 mL/min — ABNORMAL LOW (ref >60.00)
Glucose, Bld: 119 mg/dL — ABNORMAL HIGH (ref 70–99)
Potassium: 2.9 mEq/L — ABNORMAL LOW (ref 3.5–5.1)
Sodium: 142 mEq/L (ref 135–145)

## 2021-05-25 LAB — LIPID PANEL
Cholesterol: 235 mg/dL — ABNORMAL HIGH (ref 0–200)
HDL: 54.7 mg/dL (ref >39.00)
LDL Cholesterol: 149 mg/dL — ABNORMAL HIGH (ref 0–99)
NonHDL: 180.38
Total CHOL/HDL Ratio: 4
Triglycerides: 157 mg/dL — ABNORMAL HIGH (ref 0.0–149.0)
VLDL: 31.4 mg/dL (ref 0.0–40.0)

## 2021-05-25 LAB — MICROALBUMIN / CREATININE URINE RATIO
Creatinine,U: 36.3 mg/dL
Microalb Creat Ratio: 661.4 mg/g — ABNORMAL HIGH (ref 0.0–30.0)
Microalb, Ur: 240 mg/dL — ABNORMAL HIGH (ref 0.0–1.9)

## 2021-05-26 ENCOUNTER — Telehealth: Payer: Self-pay | Admitting: Internal Medicine

## 2021-05-26 ENCOUNTER — Encounter: Payer: Self-pay | Admitting: Internal Medicine

## 2021-05-26 DIAGNOSIS — E1022 Type 1 diabetes mellitus with diabetic chronic kidney disease: Secondary | ICD-10-CM | POA: Insufficient documentation

## 2021-05-26 DIAGNOSIS — E785 Hyperlipidemia, unspecified: Secondary | ICD-10-CM | POA: Insufficient documentation

## 2021-05-26 DIAGNOSIS — N186 End stage renal disease: Secondary | ICD-10-CM | POA: Insufficient documentation

## 2021-05-26 MED ORDER — LISINOPRIL 40 MG PO TABS: 40.0000 mg | ORAL_TABLET | Freq: Every day | ORAL | 3 refills | Status: DC

## 2021-05-26 NOTE — Telephone Encounter (Signed)
Can 1 of you please follow-up on the referral placed to nephrology back in August 2022?    Patient has chronic kidney disease stage IV and her kidneys are worsening quickly thanks

## 2021-05-27 ENCOUNTER — Encounter: Payer: Self-pay | Admitting: Internal Medicine

## 2021-06-07 ENCOUNTER — Encounter: Payer: Medicaid Other | Attending: Internal Medicine | Admitting: Nutrition

## 2021-06-07 ENCOUNTER — Other Ambulatory Visit: Payer: Self-pay

## 2021-06-07 DIAGNOSIS — E1065 Type 1 diabetes mellitus with hyperglycemia: Secondary | ICD-10-CM | POA: Insufficient documentation

## 2021-06-07 NOTE — Progress Notes (Deleted)
Cardiology Office Note:    Date:  06/07/2021   ID:  Ashley Freeman, DOB 1990-08-18, MRN [id number]  PCP:  System, Provider Not In [place] Cardiologist: Glenetta Hew, MD   Reason for visit: 69-month follow-up  History of Present Illness:    Ashley Freeman is a 31 y.o. female with a hx of type 1 diabetes, hypertension, CKD, gastroparesis.  She was last seen by Dr. Ellyn Freeman in April 2021 for borderline hypertension when she was 6 months pregnant.  She was admitted to the hospital in December 2022 with diffuse pain, vomiting and diarrhea.  Patient's insulin pump had failed and she was in severe DKA and had elevated troponin (hs Troponin 496>[id number]).  Thought elevated troponin secondary to demand ischemia.  She had transient reproducible chest pain which resolved, thought to be musculoskeletal.  Echo December 2022 with EF 55 to 60%, no regional wall abnormalities, normal RV, small pericardial effusion.  Today, ***  Elevated troponin -Likely demand ischemia in the setting of severe DKA; Echo with normal EF and no WMA. -***  Hypertension -*** -Goal BP is <130/80.  Recommend DASH diet (high in vegetables, fruits, low-fat dairy products, whole grains, poultry, fish, and nuts and low in sweets, sugar-sweetened beverages, and red meats), salt restriction and increase physical activity.  Hyperlipidemia -LDL 149 on 05/24/2021. -Discussed cholesterol lowering diets - Mediterranean diet, DASH diet, vegetarian diet, low-carbohydrate diet and avoidance of trans fats.  Discussed healthier choice substitutes.  Nuts, high-fiber foods, and fiber supplements may also improve lipids.    Disposition - Follow-up in ***     Past Medical History:  Diagnosis Date   Abscess, gluteal, right 08/24/2013   AKI (acute kidney injury) ([place]) 07/26/2014   Anemia 02/19/2012   Bartholin's gland abscess 09/19/2013   BV (bacterial vaginosis) 11/24/2015   Diabetes mellitus  type I ([place]) 2001   Diagnosed at age 32 ; Type I   Diarrhea 05/30/2016   DKA (diabetic ketoacidoses) 08/19/2013   Also in 2018   Gonorrhea 08/2011   Treated in 09/2011   History of trichomoniasis 05/31/2016   Hyperlipidemia 03/28/2016   Sepsis ([place]) 09/19/2013    Past Surgical History:  Procedure Laterality Date   CESAREAN SECTION N/A 10/05/2019   Procedure: CESAREAN SECTION;  Surgeon: Aletha Halim, MD;  [facility];  Service: Obstetrics;  Laterality: N/A;   INCISION AND DRAINAGE ABSCESS Left 09/28/2019   Procedure: INCISION AND DRAINAGE VULVAR ABCESS;  Surgeon: [name], MD;  Location: [place];  Service: Gynecology;  Laterality: Left;   INCISION AND DRAINAGE PERIRECTAL ABSCESS Right 08/18/2013   Procedure: IRRIGATION AND DEBRIDEMENT GLUTEAL ABSCESS;  Surgeon: Ralene Ok, MD;  Location: [place];  Service: General;  Laterality: Right;   INCISION AND DRAINAGE PERIRECTAL ABSCESS Right 09/19/2013   Procedure: IRRIGATION AND DEBRIDEMENT RIGHT GLUTEAL AND LABIAL ABSCESSES;  Surgeon: Ralene Ok, MD;  Location: [place];  Service: General;  Laterality: Right;   INCISION AND DRAINAGE PERIRECTAL ABSCESS Right 09/24/2013   Procedure: [id number] AND DEBRIDEMENT PERIRECTAL ABSCESS;  Surgeon: Gwenyth Ober, MD;  Location: [place];  Service: General;  Laterality: Right;    Current Medications: No outpatient medications have been marked as taking for the 06/08/21 encounter (Appointment) with Warren Lacy, PA-C.     Allergies:   Cephalexin, Penicillins, Benadryl [diphenhydramine], and Doxycycline   Social History   Socioeconomic History   Marital status: Single    Spouse name: Not on file   Number of children: 0  Years of education: 11th grade   Highest education level: Not on file  Occupational History   Occupation: unemployed    Comment: has never worked  Tobacco Use   Smoking status: Never   Smokeless tobacco: Never  Vaping Use   Vaping Use: Never used  Substance and  Sexual Activity   Alcohol use: Not Currently   Drug use: No   Sexual activity: Yes    Birth control/protection: None  Other Topics Concern   Not on file  Social History Narrative   Patient lives in [place] mother lives in [place].  Unemployed.  Previously worked for a IT consultant.  Completed 11 grade working on [employer]. Patient 3 brothers    Social Determinants of Radio broadcast assistant Strain: Not on file  Food Insecurity: Not on file  Transportation Needs: Not on file  Physical Activity: Not on file  Stress: Not on file  Social Connections: Not on file     Family History: The patient's family history includes Asthma in her mother; Carpal tunnel syndrome in her mother; Diabetes in her paternal grandmother; Gout in her father. There is no history of Anesthesia problems.  ROS:   Please see the history of present illness.     EKGs/Labs/Other Studies Reviewed:    EKG:  The ekg ordered today demonstrates ***  Recent Labs: 03/31/2021: TSH 3.642 04/08/2021: Magnesium 2.1 04/13/2021: B Natriuretic Peptide 963.0 04/24/2021: ALT 15; Hemoglobin 8.2; Platelets 195 05/24/2021: BUN 24; Creatinine, Ser 2.31; Potassium 2.9; Sodium 142   Recent Lipid Panel Lab Results  Component Value Date/Time   CHOL 235 (H) 05/24/2021 03:52 PM   TRIG 157.0 (H) 05/24/2021 03:52 PM   HDL 54.70 05/24/2021 03:52 PM   LDLCALC 149 (H) 05/24/2021 03:52 PM    Physical Exam:    VS:  There were no vitals taken for this visit.   No data found.  Wt Readings from Last 3 Encounters:  05/24/21 114 lb 9.6 oz (52 kg)  04/11/21 148 lb 2.4 oz (67.2 kg)  04/08/21 136 lb 11 oz (62 kg)     GEN: *** Well nourished, well developed in no acute distress HEENT: Normal NECK: No JVD; No carotid bruits CARDIAC: ***RRR, no murmurs, rubs, gallops RESPIRATORY:  Clear to auscultation without rales, wheezing or rhonchi  ABDOMEN: Soft, non-tender, non-distended MUSCULOSKELETAL: No edema; No deformity  SKIN:  Warm and dry NEUROLOGIC:  Alert and oriented PSYCHIATRIC:  Normal affect     ASSESSMENT AND PLAN   ***   {Are you ordering a CV Procedure (e.g. stress test, cath, DCCV, TEE, etc)?   Press F2        :[id number]}    Medication Adjustments/Labs and Tests Ordered: Current medicines are reviewed at length with the patient today.  Concerns regarding medicines are outlined above.  No orders of the defined types were placed in this encounter.  No orders of the defined types were placed in this encounter.   There are no Patient Instructions on file for this visit.   Signed, Warren Lacy, PA-C  06/07/2021 2:56 PM    [facility]

## 2021-06-08 ENCOUNTER — Telehealth: Payer: Self-pay | Admitting: Nutrition

## 2021-06-08 ENCOUNTER — Ambulatory Visit: Payer: Medicaid Other | Admitting: Physician Assistant

## 2021-06-08 DIAGNOSIS — E785 Hyperlipidemia, unspecified: Secondary | ICD-10-CM

## 2021-06-08 DIAGNOSIS — I10 Essential (primary) hypertension: Secondary | ICD-10-CM

## 2021-06-08 NOTE — Progress Notes (Signed)
Patient was trained on how to use the OmniPod 5.  She is using the [facility] with a reader.  This was changed to her phone, which was linked to [place] and linked to her pod.  Settings were put into the PDM per Dr. Quin Hoop orders:  Basal rate:  MN: 0.5, 8AM: 0.7.  I/C: 15, ISF: 65, target: 110 with correction over 120.  Timing: 4 hours.  She filled a pod with Novolog insulin, and was shown where to place this with reference to her Dexcom.  She attached it to her left upper outer arm and the pump was put in the Automated mode.  She says that she knows how to count carbs, but we reviewed what they were and she reported good understanding of this.   She was shown how to do a meal bolus with stress on the need to put in blood sugar readings.  She was also shown how/ when to do a correction bolus, stressing that bedtime was always a good time for this.  She reported good understanding of this, and we reviewed the checklist and she signed this indicating understanding all topics. We also reviewed high blood sugar protocols with a stress that she is now on no long acting insulin, so the need to do a correction bolus, wait 2 hours, and if the readings do not come down, she needs to change out her pod.  She reported good understanding of this and had no final questions.  Reminded her of the need to see Dr. Kelton Pillar in 2 weeks.

## 2021-06-08 NOTE — Patient Instructions (Signed)
Review manual and starter booklet Call 800 help line if questions on pump use. Return to see Dr. Kelton Pillar in 2 weeks.

## 2021-06-08 NOTE — Telephone Encounter (Signed)
Patient reported no difficulties in using her pump last night or this AM,  Says FBS today was 67 at 5AM.

## 2021-06-09 ENCOUNTER — Encounter: Payer: Self-pay | Admitting: Internal Medicine

## 2021-06-19 ENCOUNTER — Emergency Department (HOSPITAL_COMMUNITY)
Admission: EM | Admit: 2021-06-19 | Discharge: 2021-06-20 | Disposition: A | Discharge status: Home / Self Care | Payer: Medicaid Other | Source: Home / Self Care

## 2021-06-19 ENCOUNTER — Encounter (HOSPITAL_COMMUNITY): Payer: Self-pay

## 2021-06-19 ENCOUNTER — Other Ambulatory Visit: Payer: Self-pay

## 2021-06-19 DIAGNOSIS — Z794 Long term (current) use of insulin: Secondary | ICD-10-CM | POA: Diagnosis not present

## 2021-06-19 DIAGNOSIS — Z5321 Procedure and treatment not carried out due to patient leaving prior to being seen by health care provider: Secondary | ICD-10-CM | POA: Insufficient documentation

## 2021-06-19 DIAGNOSIS — E1022 Type 1 diabetes mellitus with diabetic chronic kidney disease: Secondary | ICD-10-CM | POA: Diagnosis not present

## 2021-06-19 DIAGNOSIS — Z20822 Contact with and (suspected) exposure to covid-19: Secondary | ICD-10-CM | POA: Insufficient documentation

## 2021-06-19 DIAGNOSIS — R197 Diarrhea, unspecified: Secondary | ICD-10-CM | POA: Insufficient documentation

## 2021-06-19 DIAGNOSIS — R112 Nausea with vomiting, unspecified: Secondary | ICD-10-CM | POA: Insufficient documentation

## 2021-06-19 DIAGNOSIS — Z825 Family history of asthma and other chronic lower respiratory diseases: Secondary | ICD-10-CM | POA: Diagnosis not present

## 2021-06-19 DIAGNOSIS — E869 Volume depletion, unspecified: Secondary | ICD-10-CM | POA: Diagnosis not present

## 2021-06-19 DIAGNOSIS — D72828 Other elevated white blood cell count: Secondary | ICD-10-CM | POA: Diagnosis not present

## 2021-06-19 DIAGNOSIS — E872 Acidosis, unspecified: Secondary | ICD-10-CM | POA: Diagnosis not present

## 2021-06-19 DIAGNOSIS — Z9641 Presence of insulin pump (external) (internal): Secondary | ICD-10-CM | POA: Diagnosis not present

## 2021-06-19 DIAGNOSIS — E785 Hyperlipidemia, unspecified: Secondary | ICD-10-CM | POA: Diagnosis not present

## 2021-06-19 DIAGNOSIS — N179 Acute kidney failure, unspecified: Secondary | ICD-10-CM | POA: Diagnosis present

## 2021-06-19 DIAGNOSIS — E1040 Type 1 diabetes mellitus with diabetic neuropathy, unspecified: Secondary | ICD-10-CM | POA: Diagnosis not present

## 2021-06-19 DIAGNOSIS — A059 Bacterial foodborne intoxication, unspecified: Secondary | ICD-10-CM | POA: Diagnosis not present

## 2021-06-19 DIAGNOSIS — I129 Hypertensive chronic kidney disease with stage 1 through stage 4 chronic kidney disease, or unspecified chronic kidney disease: Secondary | ICD-10-CM | POA: Diagnosis not present

## 2021-06-19 DIAGNOSIS — Z833 Family history of diabetes mellitus: Secondary | ICD-10-CM | POA: Diagnosis not present

## 2021-06-19 DIAGNOSIS — N184 Chronic kidney disease, stage 4 (severe): Secondary | ICD-10-CM | POA: Diagnosis not present

## 2021-06-19 LAB — CBC
HCT: 33 % — ABNORMAL LOW (ref 36.0–46.0)
Hemoglobin: 10 g/dL — ABNORMAL LOW (ref 12.0–15.0)
MCH: 28.4 pg (ref 26.0–34.0)
MCHC: 30.3 g/dL (ref 30.0–36.0)
MCV: 93.8 fL (ref 80.0–100.0)
Platelets: 407 10*3/uL — ABNORMAL HIGH (ref 150–400)
RBC: 3.52 MIL/uL — ABNORMAL LOW (ref 3.87–5.11)
RDW: 16.7 % — ABNORMAL HIGH (ref 11.5–15.5)
WBC: 13.5 10*3/uL — ABNORMAL HIGH (ref 4.0–10.5)
nRBC: 0.2 % (ref 0.0–0.2)

## 2021-06-19 LAB — COMPREHENSIVE METABOLIC PANEL
ALT: 16 U/L (ref 0–44)
AST: 15 U/L (ref 15–41)
Albumin: 3.1 g/dL — ABNORMAL LOW (ref 3.5–5.0)
Alkaline Phosphatase: 66 U/L (ref 38–126)
Anion gap: 8 (ref 5–15)
BUN: 41 mg/dL — ABNORMAL HIGH (ref 6–20)
CO2: 8 mmol/L — ABNORMAL LOW (ref 22–32)
Calcium: 8.5 mg/dL — ABNORMAL LOW (ref 8.9–10.3)
Chloride: 122 mmol/L — ABNORMAL HIGH (ref 98–111)
Creatinine, Ser: 3.43 mg/dL — ABNORMAL HIGH (ref 0.44–1.00)
GFR, Estimated: 18 mL/min — ABNORMAL LOW (ref >60)
Glucose, Bld: 130 mg/dL — ABNORMAL HIGH (ref 70–99)
Potassium: 4.2 mmol/L (ref 3.5–5.1)
Sodium: 138 mmol/L (ref 135–145)
Total Bilirubin: 0.4 mg/dL (ref 0.3–1.2)
Total Protein: 6.7 g/dL (ref 6.5–8.1)

## 2021-06-19 LAB — I-STAT BETA HCG BLOOD, ED (MC, WL, AP ONLY): I-stat hCG, quantitative: <5 m[IU]/mL (ref <5)

## 2021-06-19 LAB — LIPASE, BLOOD: Lipase: 59 U/L — ABNORMAL HIGH (ref 11–51)

## 2021-06-19 MED ORDER — ONDANSETRON 4 MG PO TBDP
4.0000 mg | ORAL_TABLET | Freq: Once | ORAL | Status: AC
  Administered 2021-06-19: 4 mg via ORAL
  Filled 2021-06-19: qty 1

## 2021-06-19 NOTE — ED Triage Notes (Signed)
Reports eating bad hamburger on Thursday and has been vomiting since.

## 2021-06-19 NOTE — ED Provider Triage Note (Signed)
Emergency Medicine Provider Triage Evaluation Note  Ashley Freeman , a 31 y.o. female  was evaluated in triage.  Pt complains of nausea vomiting and diarrhea.  Patient has had about 3 days of viral prodrome with myalgias,, malaise, fatigue and chills.  Today she started having nausea vomiting and diarrhea.  She has some abdominal cramping.  She feels "terrible.".  Review of Systems  Positive: Nausea vomiting diarrhea Negative: Fever  Physical Exam  BP 95/69 (BP Location: Right Arm)    Pulse 99    Temp 97.9 F (36.6 C) (Oral)    Resp 17    Ht 5\' 3"  (1.6 m)    Wt 51.7 kg    SpO2 100%    BMI 20.19 kg/m  Gen:   Awake, no distress, ashen color Resp:  Normal effort  MSK:   Moves extremities without difficulty  Other:  No abdominal distention guarding or tenderness  Medical Decision Making  Medically screening exam initiated at 11:21 PM.  Appropriate orders placed.  Ashley Freeman was informed that the remainder of the evaluation will be completed by another provider, this initial triage assessment does not replace that evaluation, and the importance of remaining in the ED until their evaluation is complete.  Labs and work-up initiated, benign abdominal exam.   Margarita Mail, PA-C 06/19/21 [id number]

## 2021-06-20 ENCOUNTER — Other Ambulatory Visit: Payer: Self-pay

## 2021-06-20 ENCOUNTER — Encounter (HOSPITAL_COMMUNITY): Payer: Self-pay

## 2021-06-20 ENCOUNTER — Emergency Department (HOSPITAL_COMMUNITY)
Admission: EM | Admit: 2021-06-20 | Discharge: 2021-06-20 | DRG: 683 | Discharge status: Against Medical Advice | Payer: Medicaid Other | Attending: Internal Medicine | Admitting: Internal Medicine

## 2021-06-20 DIAGNOSIS — Z794 Long term (current) use of insulin: Secondary | ICD-10-CM

## 2021-06-20 DIAGNOSIS — E869 Volume depletion, unspecified: Secondary | ICD-10-CM | POA: Diagnosis present

## 2021-06-20 DIAGNOSIS — E785 Hyperlipidemia, unspecified: Secondary | ICD-10-CM | POA: Diagnosis present

## 2021-06-20 DIAGNOSIS — E872 Acidosis, unspecified: Secondary | ICD-10-CM

## 2021-06-20 DIAGNOSIS — D72828 Other elevated white blood cell count: Secondary | ICD-10-CM | POA: Diagnosis present

## 2021-06-20 DIAGNOSIS — R112 Nausea with vomiting, unspecified: Secondary | ICD-10-CM | POA: Diagnosis not present

## 2021-06-20 DIAGNOSIS — Z833 Family history of diabetes mellitus: Secondary | ICD-10-CM | POA: Diagnosis not present

## 2021-06-20 DIAGNOSIS — N1832 Chronic kidney disease, stage 3b: Secondary | ICD-10-CM | POA: Diagnosis not present

## 2021-06-20 DIAGNOSIS — I129 Hypertensive chronic kidney disease with stage 1 through stage 4 chronic kidney disease, or unspecified chronic kidney disease: Secondary | ICD-10-CM | POA: Diagnosis present

## 2021-06-20 DIAGNOSIS — D72829 Elevated white blood cell count, unspecified: Secondary | ICD-10-CM | POA: Diagnosis present

## 2021-06-20 DIAGNOSIS — A059 Bacterial foodborne intoxication, unspecified: Secondary | ICD-10-CM | POA: Diagnosis present

## 2021-06-20 DIAGNOSIS — N179 Acute kidney failure, unspecified: Secondary | ICD-10-CM | POA: Diagnosis present

## 2021-06-20 DIAGNOSIS — Z9641 Presence of insulin pump (external) (internal): Secondary | ICD-10-CM | POA: Diagnosis present

## 2021-06-20 DIAGNOSIS — R197 Diarrhea, unspecified: Secondary | ICD-10-CM

## 2021-06-20 DIAGNOSIS — Z825 Family history of asthma and other chronic lower respiratory diseases: Secondary | ICD-10-CM

## 2021-06-20 DIAGNOSIS — N186 End stage renal disease: Secondary | ICD-10-CM | POA: Diagnosis present

## 2021-06-20 DIAGNOSIS — Z20822 Contact with and (suspected) exposure to covid-19: Secondary | ICD-10-CM | POA: Diagnosis present

## 2021-06-20 DIAGNOSIS — R1110 Vomiting, unspecified: Secondary | ICD-10-CM

## 2021-06-20 DIAGNOSIS — E1040 Type 1 diabetes mellitus with diabetic neuropathy, unspecified: Secondary | ICD-10-CM | POA: Diagnosis present

## 2021-06-20 DIAGNOSIS — N184 Chronic kidney disease, stage 4 (severe): Secondary | ICD-10-CM | POA: Diagnosis present

## 2021-06-20 DIAGNOSIS — E1022 Type 1 diabetes mellitus with diabetic chronic kidney disease: Secondary | ICD-10-CM | POA: Diagnosis present

## 2021-06-20 LAB — CBC WITH DIFFERENTIAL/PLATELET
Abs Immature Granulocytes: 0.09 10*3/uL — ABNORMAL HIGH (ref 0.00–0.07)
Basophils Absolute: 0 10*3/uL (ref 0.0–0.1)
Basophils Relative: 0 %
Eosinophils Absolute: 0.3 10*3/uL (ref 0.0–0.5)
Eosinophils Relative: 2 %
HCT: 27.6 % — ABNORMAL LOW (ref 36.0–46.0)
Hemoglobin: 8.6 g/dL — ABNORMAL LOW (ref 12.0–15.0)
Immature Granulocytes: 1 %
Lymphocytes Relative: 29 %
Lymphs Abs: 3.3 10*3/uL (ref 0.7–4.0)
MCH: 27.7 pg (ref 26.0–34.0)
MCHC: 31.2 g/dL (ref 30.0–36.0)
MCV: 88.7 fL (ref 80.0–100.0)
Monocytes Absolute: 0.9 10*3/uL (ref 0.1–1.0)
Monocytes Relative: 7 %
Neutro Abs: 7.1 10*3/uL (ref 1.7–7.7)
Neutrophils Relative %: 61 %
Platelets: 345 10*3/uL (ref 150–400)
RBC: 3.11 MIL/uL — ABNORMAL LOW (ref 3.87–5.11)
RDW: 16.5 % — ABNORMAL HIGH (ref 11.5–15.5)
WBC: 11.6 10*3/uL — ABNORMAL HIGH (ref 4.0–10.5)
nRBC: 0.2 % (ref 0.0–0.2)

## 2021-06-20 LAB — COMPREHENSIVE METABOLIC PANEL
ALT: 14 U/L (ref 0–44)
AST: 13 U/L — ABNORMAL LOW (ref 15–41)
Albumin: 2.9 g/dL — ABNORMAL LOW (ref 3.5–5.0)
Alkaline Phosphatase: 60 U/L (ref 38–126)
Anion gap: 6 (ref 5–15)
BUN: 41 mg/dL — ABNORMAL HIGH (ref 6–20)
CO2: 11 mmol/L — ABNORMAL LOW (ref 22–32)
Calcium: 8.4 mg/dL — ABNORMAL LOW (ref 8.9–10.3)
Chloride: 122 mmol/L — ABNORMAL HIGH (ref 98–111)
Creatinine, Ser: 3.01 mg/dL — ABNORMAL HIGH (ref 0.44–1.00)
GFR, Estimated: 21 mL/min — ABNORMAL LOW (ref >60)
Glucose, Bld: 106 mg/dL — ABNORMAL HIGH (ref 70–99)
Potassium: 3.5 mmol/L (ref 3.5–5.1)
Sodium: 139 mmol/L (ref 135–145)
Total Bilirubin: 0.2 mg/dL — ABNORMAL LOW (ref 0.3–1.2)
Total Protein: 6.5 g/dL (ref 6.5–8.1)

## 2021-06-20 LAB — BLOOD GAS, VENOUS
Acid-base deficit: 19.4 mmol/L — ABNORMAL HIGH (ref 0.0–2.0)
Bicarbonate: 7.3 mmol/L — ABNORMAL LOW (ref 20.0–28.0)
O2 Saturation: 97.2 %
Patient temperature: 37
pCO2, Ven: 20 mmHg — ABNORMAL LOW (ref 44–60)
pH, Ven: 7.17 — CL (ref 7.25–7.43)
pO2, Ven: 79 mmHg — ABNORMAL HIGH (ref 32–45)

## 2021-06-20 LAB — URINALYSIS, ROUTINE W REFLEX MICROSCOPIC
Bilirubin Urine: NEGATIVE
Glucose, UA: NEGATIVE mg/dL
Hgb urine dipstick: NEGATIVE
Ketones, ur: NEGATIVE mg/dL
Nitrite: NEGATIVE
Protein, ur: >=300 mg/dL — AB
Specific Gravity, Urine: 1.011 (ref 1.005–1.030)
pH: 5 (ref 5.0–8.0)

## 2021-06-20 LAB — RESP PANEL BY RT-PCR (FLU A&B, COVID) ARPGX2
Influenza A by PCR: NEGATIVE
Influenza B by PCR: NEGATIVE
SARS Coronavirus 2 by RT PCR: NEGATIVE

## 2021-06-20 LAB — I-STAT BETA HCG BLOOD, ED (MC, WL, AP ONLY): I-stat hCG, quantitative: <5 m[IU]/mL (ref <5)

## 2021-06-20 LAB — CBG MONITORING, ED: Glucose-Capillary: 101 mg/dL — ABNORMAL HIGH (ref 70–99)

## 2021-06-20 LAB — CK: Total CK: 243 U/L — ABNORMAL HIGH (ref 38–234)

## 2021-06-20 LAB — MAGNESIUM: Magnesium: 2 mg/dL (ref 1.7–2.4)

## 2021-06-20 LAB — BETA-HYDROXYBUTYRIC ACID: Beta-Hydroxybutyric Acid: 0.19 mmol/L (ref 0.05–0.27)

## 2021-06-20 LAB — PHOSPHORUS: Phosphorus: 6.9 mg/dL — ABNORMAL HIGH (ref 2.5–4.6)

## 2021-06-20 MED ORDER — CARVEDILOL 3.125 MG PO TABS
6.2500 mg | ORAL_TABLET | Freq: Two times a day (BID) | ORAL | Status: DC
Start: 2021-06-20 — End: 2021-06-20

## 2021-06-20 MED ORDER — ONDANSETRON HCL 4 MG/2ML IJ SOLN
4.0000 mg | Freq: Once | INTRAMUSCULAR | Status: AC
  Administered 2021-06-20: 4 mg via INTRAVENOUS
  Filled 2021-06-20: qty 2

## 2021-06-20 MED ORDER — ACETAMINOPHEN 325 MG PO TABS: 650.0000 mg | ORAL_TABLET | Freq: Four times a day (QID) | ORAL | Status: DC | PRN

## 2021-06-20 MED ORDER — AMLODIPINE BESYLATE 5 MG PO TABS
10.0000 mg | ORAL_TABLET | Freq: Every day | ORAL | Status: DC
  Filled 2021-06-20: qty 2

## 2021-06-20 MED ORDER — LAMOTRIGINE 25 MG PO TABS
150.0000 mg | ORAL_TABLET | Freq: Every day | ORAL | Status: DC
  Filled 2021-06-20: qty 2

## 2021-06-20 MED ORDER — STERILE WATER FOR INJECTION IV SOLN
INTRAVENOUS | Status: DC
  Filled 2021-06-20: qty 1000
  Filled 2021-06-20: qty 150

## 2021-06-20 MED ORDER — ONDANSETRON HCL 4 MG PO TABS: 4.0000 mg | ORAL_TABLET | Freq: Four times a day (QID) | ORAL | Status: DC | PRN

## 2021-06-20 MED ORDER — SODIUM CHLORIDE 0.9 % IV BOLUS
1000.0000 mL | Freq: Once | INTRAVENOUS | Status: AC
  Administered 2021-06-20: 1000 mL via INTRAVENOUS

## 2021-06-20 MED ORDER — ACETAMINOPHEN 500 MG PO TABS
1000.0000 mg | ORAL_TABLET | Freq: Once | ORAL | Status: AC
  Administered 2021-06-20: 1000 mg via ORAL
  Filled 2021-06-20: qty 2

## 2021-06-20 MED ORDER — ONDANSETRON HCL 4 MG/2ML IJ SOLN: 4.0000 mg | Freq: Four times a day (QID) | INTRAMUSCULAR | Status: DC | PRN

## 2021-06-20 MED ORDER — HEPARIN SODIUM (PORCINE) 5000 UNIT/ML IJ SOLN
5000.0000 [IU] | Freq: Three times a day (TID) | INTRAMUSCULAR | Status: DC
  Filled 2021-06-20: qty 1

## 2021-06-20 MED ORDER — INSULIN ASPART 100 UNIT/ML IJ SOLN
0.0000 [IU] | Freq: Every day | INTRAMUSCULAR | Status: DC
  Filled 2021-06-20: qty 0.05

## 2021-06-20 MED ORDER — ACETAMINOPHEN 650 MG RE SUPP: 650.0000 mg | Freq: Four times a day (QID) | RECTAL | Status: DC | PRN

## 2021-06-20 MED ORDER — INSULIN PUMP
Freq: Three times a day (TID) | SUBCUTANEOUS | Status: DC
  Filled 2021-06-20: qty 1

## 2021-06-20 MED ORDER — INSULIN ASPART 100 UNIT/ML IJ SOLN
0.0000 [IU] | Freq: Three times a day (TID) | INTRAMUSCULAR | Status: DC
  Filled 2021-06-20: qty 0.09

## 2021-06-20 NOTE — Assessment & Plan Note (Signed)
Due to food borne illness. Could also be due to metabolic acidosis. Prn zofran.

## 2021-06-20 NOTE — Progress Notes (Signed)
PROGRESS NOTE  Ashley Freeman  DOB: [birth year]  PCP: System, Provider Not In MVH:[id number]  DOA: 06/20/2021  LOS: 0 days  Hospital Day: 1  Brief narrative: Lynasia Meloche Freeman is a 31 y.o. female with PMH significant for type 1 diabetes from the age of 65, history of DKAs, hyperlipidemia, CKD 4, chronic anemia, diabetic neuropathy. Patient presented to the ED on 2/20 with complaint of nausea, vomiting, diarrhea for 3 to 4 days that started after she ate a hamburger.  Her mother had similar symptoms but she got better while patient continued to have symptoms through the weekend.  She was not able to eat or drink anything and hence presented to ED at [facility]  She had labs drawn but she left as she had to wait several hours to be seen.  She then presented to ED at [facility].  In the ED, she had no fever, heart rate in 90s, blood pressure in 90s, breathing on room air Initial labs with pH low at 7.17, PCO2 low at 20, serum bicarb low at 8, however beta-hydroxybutyrate level normal, glucose 130, anion gap normal. BUN/creatinine 41/3.43, lipase 59, WBC 13.5, hemoglobin 10. She was admitted to hospitalist service for further evaluation management.  Subjective: Patient was seen and examined this morning. Young African-American female.  Lying on bed.  No vomiting or diarrhea since last night.  She has been able to tolerate soft diet this morning.  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 -Baseline creatinine 2-2.3.  Presented with creatinine of 3.43, gradually improving with IV fluid.  Continue to  monitor. -Avoid NSAIDs. -Patient states that [facility] has first appointment with nephrology next month. Recent Labs    04/09/21 0741 04/10/21 2222 04/11/21 0417 04/12/21 0445 04/13/21 0421 04/14/21 0834 04/24/21 0601 05/24/21 1552 06/19/21 2311 06/20/21 0942  BUN 24* 22* 21* 18 18 19 14  24* 41* 41*  CREATININE 2.54* 2.11* 1.87* 1.79* 1.81* 1.71* 1.90* 2.31* 3.43* 3.01*  CO2 19* 19* 19* 17* 18* 18* 21* 12* 8* 11*   Acute on chronic metabolic acidosis -It seems her serum bicarb usually runs less than 20. It was acutely low at 8 on this presentation.  Probably due to AKI.  Currently on bicarbonate drip at 100 mill per hour.  Continue same.  Hyperphosphatemia -Phosphorus level elevated to 6.9 today.  Probably due to AKI.  Monitor the change.  If continues to rise, may consider inpatient nephrology consult  Acute gastroenteritis Leukocytosis -Patient states her symptoms started after she ate hamburger with her mother on 2/16.  Her mother had similar symptoms but got better while patient continued to worsen. -No fever, mild leukocytosis.  Gradually improving WBC count.  No suspicion of bacterial infection at this time. -Last episode of vomiting and diarrhea was yesterday.  Able to tolerate solid diet at this time. Recent Labs  Lab 06/19/21 2311 06/20/21 0942  WBC 13.5* 11.6*   Type 1 diabetes mellitus -Diagnosed with diabetes at the age of 86.   -A1c 8.3 from January 2023. -Patient is on insulin pump at home.  Currently continued on  the same.  Diabetes care coordinator consulted. Recent Labs  Lab 06/20/21 1126  GLUCAP 101*   Essential hypertension -Home meds include Coreg 6.25 mg twice daily, Lasix 20 mg twice daily, Norvasc 10 mg daily, lisinopril 40 mg daily. -Currently meds on hold.  Blood pressure elevated to 160s this morning. -Resume Coreg, Norvasc.  Keep Lasix and lisinopril on hold.  ?  History of seizure -Patient seems to be on Lamictal 150 mg daily at home.  Resume  the same.   Mobility: Encourage ambulation Goals of care   Code Status: Full Code    Nutritional status:  There is no height or weight on file to calculate BMI.      Diet:  Diet Order             Diet renal/carb modified with fluid restriction Diet-HS Snack? Nothing; Fluid restriction: 2000 mL Fluid; Room service appropriate? Yes; Fluid consistency: Thin  Diet effective now                   DVT prophylaxis:  heparin injection 5,000 Units Start: 06/20/21 0600 SCDs Start: 06/20/21 0441   Antimicrobials: None Fluid: Serum bicarb at 100/h Consultants: None Family Communication: None at bedside  Status is: Inpatient  Continue in-hospital care because: Needs continuous IV fluid for AKI, metabolic acidosis, diabetes control Level of care: Stepdown   Dispo: The patient is from: Home              Anticipated d/c is to: Home in 2 to 3 days              Patient currently is not medically stable to d/c.   Difficult to place patient No     Infusions:    sodium bicarbonate (isotonic) infusion in sterile water 100 mL/hr at 06/20/21 3716    Scheduled Meds:  amLODipine  10 mg Oral Daily   carvedilol  6.25 mg Oral BID   heparin  5,000 Units Subcutaneous Q8H   lamoTRIgine  150 mg Oral Daily    PRN meds: acetaminophen **OR** acetaminophen, ondansetron **OR** ondansetron (ZOFRAN) IV   Antimicrobials: Anti-infectives (From admission, onward)    None       Objective: Vitals:   06/20/21 0809 06/20/21 1100  BP:  (!) 165/110  Pulse:  93  Resp:  14  Temp: 97.8 F (36.6 C)   SpO2:  100%   No intake or output data in the 24 hours ending 06/20/21 1201 There were no vitals filed for this visit. Weight change:  There is no height or weight on file to calculate BMI.   Physical Exam: General exam: Pleasant, young African-American female.  Not in physical distress Skin: No rashes, lesions or ulcers. HEENT: Atraumatic, normocephalic, no obvious bleeding Lungs:  Clear to auscultation bilaterally CVS: Regular rate and rhythm, no murmur GI/Abd soft, nontender, nondistended, bowel sound present CNS: Alert, awake oriented x3 Psychiatry: Mood appropriate Extremities: Soft, nontender, nondistended, bowel sound present  Data Review: I have personally reviewed the laboratory data and studies available.  F/u labs ordered Unresulted Labs (From admission, onward)     Start     Ordered   06/21/21 0500  CBC with Differential/Platelet  Daily,   R      06/20/21 0839   06/21/21 9678  Basic metabolic panel  Daily,   R      06/20/21 0839   06/20/21 0332  Gastrointestinal Panel by PCR , Stool  (Gastrointestinal Panel by PCR, Stool                                                                                                                                                     **  Does Not include CLOSTRIDIUM DIFFICILE testing. **If CDIFF testing is needed, place order from the "C Difficile Testing" order set.**)  Once,   R        06/20/21 0331            Signed, Terrilee Croak, MD [facility] 06/20/2021

## 2021-06-20 NOTE — ED Notes (Signed)
Pt called out stating her sugar was low; dexcom was showing cbg of 61; pt given apple juice and graham crackers

## 2021-06-20 NOTE — ED Notes (Signed)
Patient out in hallway requesting to leave due to "waiting 2 days" and "I'm being treated like an animal". States that she has not been given proper food since being here and states that she is "healed" and wants to be discharged. Advised on being admitted for current diagnosis. States that "I just need to leave". Advised that this nurse would let the current nurse know about discharge request.

## 2021-06-20 NOTE — ED Notes (Signed)
Hospitalist notified regarding pt continues to use personal insulin pump, as previously verbally discussed with Hospitalist this AM Novolog insulin held at this time. Will await further orders from Hospitalist regarding continued use of personl insulin pump versus d/c of insulin pump with Novolog coverage.

## 2021-06-20 NOTE — Assessment & Plan Note (Signed)
Start bicarb gtts.

## 2021-06-20 NOTE — Assessment & Plan Note (Addendum)
Baseline Scr 2.3, pt has been referred to nephrology but does not have appointment until next month. May need to see nephrology sooner, possibly inpatient consult.  Avoid nonsteroidal anti-inflammatories.  Patient states she was not told that she needs to avoid ibuprofen.  She took a dose of ibuprofen due to a headache this weekend.

## 2021-06-20 NOTE — Assessment & Plan Note (Signed)
May be due to food borne illness. Pt states she ate hamburger last week. Next day she developed N/V/D. Pt's mother also had hamburger and developed lesser symptoms but still had N/V/D. Check stool cultures for enterotoxigenic E. Coli.

## 2021-06-20 NOTE — ED Notes (Signed)
Patient does not want BP cuff on her arm, state's it hurts.  Will take v/s as needed per orders/policy.

## 2021-06-20 NOTE — Assessment & Plan Note (Signed)
Admit to SDU. Serum bicarb at 8 but no DKA. Likely due to volume depletion from N/V/D. Start IV bicarb gtts due to pH of 7.1

## 2021-06-20 NOTE — ED Notes (Signed)
Pt made aware she has a room upstairs and pt stating she would still like to leave. Hospitalist notified. This nurse has requested the Hospitalist come and explain the AMA process to the pt.

## 2021-06-20 NOTE — ED Notes (Signed)
Confirmed with Hospitalist, Dr. Pietro Cassis that pt will continue home-insulin pump use while holding here ED. Hospitalist to order Diabetes Coordinator consult.

## 2021-06-20 NOTE — ED Notes (Signed)
Dr. Pietro Cassis, Hospitalist at the bedside. Hospitalist aware pt continues to use personal-insulin pump.

## 2021-06-20 NOTE — ED Notes (Signed)
Lab is adding on CK

## 2021-06-20 NOTE — H&P (Addendum)
History and Physical    Ashley Freeman ZOX:[id number] DOB: 28-Aug-1990 DOA: 06/20/2021  DOS: the patient was seen and examined on 06/20/2021  PCP: System, Provider Not In   Patient coming from: Home  I have personally briefly reviewed patient's old medical records in [facility]  CC: N/V/D HPI: 31 year old African-American female with a history of type 1 diabetes since she was [age], history of CKD stage IV, diabetic nephropathy, diabetic neuropathy, presents to the ER today with a 4-day history of nausea vomiting and diarrhea.  Patient states that on Thursday night, she and her mother had hamburger.  Patient woke up on Friday morning with nausea vomiting and diarrhea.  Patient's mother also had similar symptoms but her symptoms resolved within 24 hours.  Patient's continued to have nausea vomiting diarrhea all weekend.  He is having trouble keeping liquids down.  She went to [facility], ER tonight.  He waited several hours.  She had labs drawn but left before being seen.  She came to [facility] for evaluation.  Labs performed at [facility] showed a BUN of 41, creatinine 3.4, glucose of 130, bicarbonate of 8 White count of 13.5, hemoglobin 10.0, platelets 403  Lipase near normal at 59  COVID test was negative.  Beta hydroxybutyrate is negative.  Venous blood gas showed pH of 7.17, PCO2 of 20  Due to patient's acute kidney failure, severe metabolic acidosis, [facility] hospitalist contacted for admission.   ED Course: Labs showed severe metabolic acidosis, acute kidney failure on chronic kidney disease.  No imaging was performed.  Review of Systems:  Review of Systems  Constitutional:  Positive for malaise/fatigue. Negative for chills, fever and weight loss.  HENT: Negative.    Eyes: Negative.   Respiratory: Negative.    Cardiovascular: Negative.   Gastrointestinal:  Positive for abdominal pain, diarrhea, nausea and vomiting.  Genitourinary: Negative.    Musculoskeletal: Negative.   Skin: Negative.   Neurological: Negative.   Endo/Heme/Allergies: Negative.   Psychiatric/Behavioral: Negative.    All other systems reviewed and are negative.  Past Medical History:  Diagnosis Date   Abscess, gluteal, right 08/24/2013   AKI (acute kidney injury) (HCC) 07/26/2014   Anemia 02/19/2012   Bartholin's gland abscess 09/19/2013   BV (bacterial vaginosis) 11/24/2015   Diabetes mellitus type I (HCC) 2001   Diagnosed at age 18 ; Type I   Diarrhea 05/30/2016   DKA (diabetic ketoacidoses) 08/19/2013   Also in 2018   Gonorrhea 08/2011   Treated in 09/2011   History of trichomoniasis 05/31/2016   Hyperlipidemia 03/28/2016   Sepsis (HCC) 09/19/2013    Past Surgical History:  Procedure Laterality Date   CESAREAN SECTION N/A 10/05/2019   Procedure: CESAREAN SECTION;  Surgeon: Tomales Bing, MD;  Location: [facility] LD ORS;  Service: Obstetrics;  Laterality: N/A;   INCISION AND DRAINAGE ABSCESS Left 09/28/2019   Procedure: INCISION AND DRAINAGE VULVAR ABCESS;  Surgeon: Tilda Burrow, MD;  Location[facility] OR;  Service: Gynecology;  Laterality: Left;   INCISION AND DRAINAGE PERIRECTAL ABSCESS Right 08/18/2013   Procedure: IRRIGATION AND DEBRIDEMENT GLUTEAL ABSCESS;  Surgeon: Axel Filler, MD;  Location: [facility] OR;  Service: General;  Laterality: Right;   INCISION AND DRAINAGE PERIRECTAL ABSCESS Right 09/19/2013   Procedure: IRRIGATION AND DEBRIDEMENT RIGHT GLUTEAL AND LABIAL ABSCESSES;  Surgeon: Axel Filler, MD;  Location: [facility] OR;  Service: General;  Laterality: Right;   INCISION AND DRAINAGE PERIRECTAL ABSCESS Right 09/24/2013   Procedure: IRRIGATION AND DEBRIDEMENT PERIRECTAL ABSCESS;  Surgeon: Cherylynn Ridges, MD;  Location: [facility] OR;  Service: General;  Laterality: Right;     reports that she has never smoked. She has never used smokeless tobacco. She reports that she does not currently use alcohol. She reports that she does not use drugs.  Allergies  Allergen  Reactions   Cephalexin Anaphylaxis    Has gotten ceftriaxone in the past    Penicillins Hives and Rash    Has patient had a PCN reaction causing immediate rash, facial/tongue/throat swelling, SOB or lightheadedness with hypotension: Yes Has patient had a PCN reaction causing severe rash involving mucus membranes or skin necrosis: No Has patient had a PCN reaction that required hospitalization: Yes Has patient had a PCN reaction occurring within the last 10 years: No Spoke with pt - childhood hives told by mom, tried no pcns since, doesn't remember reaction herself    Benadryl [Diphenhydramine] Itching   Doxycycline Itching    Family History  Problem Relation Age of Onset   Asthma Mother    Carpal tunnel syndrome Mother    Gout Father    Diabetes Paternal Grandmother    Anesthesia problems Neg Hx     Prior to Admission medications   Medication Sig Start Date End Date Taking? Authorizing Provider  amLODipine (NORVASC) 10 MG tablet Take 1 tablet (10 mg total) by mouth daily. 03/24/21 06/20/22 Yes Leeroy Bock, MD  carvedilol (COREG) 6.25 MG tablet Take 6.25 mg by mouth 2 (two) times daily. 04/18/21  Yes [provider]  furosemide (LASIX) 20 MG tablet Take 20 mg by mouth 2 (two) times daily. 04/18/21  Yes [provider]  ibuprofen (ADVIL) 800 MG tablet Take 1 tablet (800 mg total) by mouth 3 (three) times daily. Patient taking differently: Take 800 mg by mouth 3 (three) times daily as needed for fever, mild pain or headache. 04/10/21  Yes [place], Leva Baine J, DO  Insulin Disposable Pump (OMNIPOD 5 G6 INTRO, GEN 5,) KIT 1 Device by Does not apply route every 3 (three) days. 05/24/21  Yes Shamleffer, Konrad Dolores, MD  lamoTRIgine (LAMICTAL) 150 MG tablet Take 150 mg by mouth daily. 03/29/21  Yes [provider]  albuterol (VENTOLIN HFA) 108 (90 Base) MCG/ACT inhaler Inhale 2 puffs into the lungs every 4 (four) hours as needed for wheezing or shortness of  breath. Patient not taking: Reported on 06/20/2021 08/09/20   [provider]  blood glucose meter kit and supplies KIT Dispense based on patient and insurance preference. Use up to four times daily as directed. (FOR ICD-9 250.00, 250.01). 05/25/17   Jacalyn Lefevre, MD  Blood Pressure Monitoring (BLOOD PRESSURE KIT) DEVI 1 Device by Does not apply route as needed. 05/06/19   Anyanwu, Jethro Bastos, MD  carbamide peroxide (DEBROX) 6.5 % OTIC solution Place 5 drops into both ears 2 (two) times daily. Patient not taking: Reported on 06/20/2021 04/14/21   Darlin Drop, DO  Continuous Blood Gluc Sensor (DEXCOM G6 SENSOR) MISC Inject 1 Device into the skin as directed. 12/16/20   Shamleffer, Konrad Dolores, MD  Continuous Blood Gluc Transmit (DEXCOM G6 TRANSMITTER) MISC Inject 1 Device into the skin as directed. Use to check blood sugar daily 12/16/20   Shamleffer, Konrad Dolores, MD  famotidine (PEPCID) 20 MG tablet Take 1 tablet (20 mg total) by mouth 2 (two) times daily. Patient not taking: Reported on 06/20/2021 04/05/21 05/24/21  Briant Cedar, MD  insulin aspart (NOVOLOG) 100 UNIT/ML injection Inject 0-6 Units into the  skin 3 (three) times daily with meals. Patient taking differently: Inject 0-6 Units into the skin 3 (three) times daily with meals. Sliding scale 04/10/21   [place], Alvira Philips, DO  Insulin Disposable Pump (OMNIPOD 5 G6 POD, GEN 5,) MISC 1 Device by Does not apply route every 3 (three) days. 05/24/21   Shamleffer, Konrad Dolores, MD  insulin glargine (LANTUS) 100 UNIT/ML injection Inject 0.1 mLs (10 Units total) into the skin daily. Patient not taking: Reported on 06/20/2021 04/14/21   Darlin Drop, DO  Insulin Pen Needle 32G X 4 MM MISC Use as directed 10/07/20   Maretta Bees, MD  ipratropium (ATROVENT HFA) 17 MCG/ACT inhaler Inhale 1 puff into the lungs every 6 (six) hours. Patient not taking: Reported on 06/20/2021 03/24/21   Leeroy Bock, MD  LATUDA 40 MG TABS tablet  Take 40 mg by mouth at bedtime. Patient not taking: Reported on 06/20/2021 05/22/21   [provider]  lisinopril (ZESTRIL) 40 MG tablet Take 1 tablet (40 mg total) by mouth daily. 05/26/21   Shamleffer, Konrad Dolores, MD  Ferrous Sulfate (IRON) 325 (65 Fe) MG TABS Take 1 tablet (325 mg total) by mouth every other day. Patient not taking: Reported on 07/25/2020 06/17/19 07/25/20  Joselyn Arrow, MD  hydrochlorothiazide (HYDRODIURIL) 25 MG tablet Take 1 tablet (25 mg total) by mouth daily. Patient not taking: No sig reported 10/08/19 07/25/20  Constant, Peggy, MD  promethazine (PHENERGAN) 25 MG tablet Take 1 tablet (25 mg total) by mouth every 8 (eight) hours as needed for nausea or vomiting (if zofran fails). Patient not taking: Reported on 10/06/2020 08/25/20 10/06/20  Derwood Kaplan, MD    Physical Exam: Vitals:   06/20/21 0050 06/20/21 0310  BP: 91/68 125/80  Pulse: 98 93  Resp: 16 12  Temp: 97.8 F (36.6 C)   TempSrc: Oral   SpO2: 100% 100%    Physical Exam Vitals and nursing note reviewed.  Constitutional:      General: She is not in acute distress.    Appearance: Normal appearance. She is normal weight. She is not ill-appearing, toxic-appearing or diaphoretic.  HENT:     Head: Normocephalic and atraumatic.     Nose: Nose normal.  Eyes:     General:        Right eye: No discharge.        Left eye: No discharge.  Cardiovascular:     Rate and Rhythm: Normal rate and regular rhythm.     Pulses: Normal pulses.  Pulmonary:     Effort: Pulmonary effort is normal. No respiratory distress.     Breath sounds: Normal breath sounds. No wheezing or rales.  Abdominal:     General: Abdomen is flat. Bowel sounds are normal. There is no distension.     Palpations: Abdomen is soft.     Tenderness: There is no abdominal tenderness. There is no guarding or rebound.  Musculoskeletal:     Right lower leg: No edema.     Left lower leg: No edema.     Comments: OmniPod insulin delivery  device on triceps area of her left arm  Skin:    General: Skin is warm and dry.     Capillary Refill: Capillary refill takes less than 2 seconds.  Neurological:     General: No focal deficit present.     Mental Status: She is alert and oriented to person, place, and time.     Labs on Admission: I have personally  reviewed following labs and imaging studies  CBC: Recent Labs  Lab 06/19/21 2311  WBC 13.5*  HGB 10.0*  HCT 33.0*  MCV 93.8  PLT 407*   Basic Metabolic Panel: Recent Labs  Lab 06/19/21 2311  NA 138  K 4.2  CL 122*  CO2 8*  GLUCOSE 130*  BUN 41*  CREATININE 3.43*  CALCIUM 8.5*   GFR: Estimated Creatinine Clearance: 19.8 mL/min (A) (by C-G formula based on SCr of 3.43 mg/dL (H)). Liver Function Tests: Recent Labs  Lab 06/19/21 2311  AST 15  ALT 16  ALKPHOS 66  BILITOT 0.4  PROT 6.7  ALBUMIN 3.1*   Recent Labs  Lab 06/19/21 2311  LIPASE 59*   No results for input(s): AMMONIA in the last 168 hours. Coagulation Profile: No results for input(s): INR, PROTIME in the last 168 hours. Cardiac Enzymes: Recent Labs  Lab 06/20/21 0116  CKTOTAL 243*   BNP (last 3 results) No results for input(s): PROBNP in the last 8760 hours. HbA1C: No results for input(s): HGBA1C in the last 72 hours. CBG: No results for input(s): GLUCAP in the last 168 hours. Lipid Profile: No results for input(s): CHOL, HDL, LDLCALC, TRIG, CHOLHDL, LDLDIRECT in the last 72 hours. Thyroid Function Tests: No results for input(s): TSH, T4TOTAL, FREET4, T3FREE, THYROIDAB in the last 72 hours. Anemia Panel: No results for input(s): VITAMINB12, FOLATE, FERRITIN, TIBC, IRON, RETICCTPCT in the last 72 hours. Urine analysis:    Component Value Date/Time   COLORURINE YELLOW 04/24/2021 1032   APPEARANCEUR CLOUDY (A) 04/24/2021 1032   APPEARANCEUR Hazy 11/10/2013 2043   LABSPEC 1.020 04/24/2021 1032   LABSPEC 1.031 11/10/2013 2043   PHURINE 6.0 04/24/2021 1032   GLUCOSEU 150 (A)  04/24/2021 1032   GLUCOSEU >=500 11/10/2013 2043   HGBUR SMALL (A) 04/24/2021 1032   BILIRUBINUR NEGATIVE 04/24/2021 1032   BILIRUBINUR negative 06/04/2018 1035   BILIRUBINUR Negative 11/24/2015 1443   BILIRUBINUR Negative 11/10/2013 2043   KETONESUR NEGATIVE 04/24/2021 1032   PROTEINUR >300 (A) 04/24/2021 1032   UROBILINOGEN 0.2 09/14/2019 1732   NITRITE NEGATIVE 04/24/2021 1032   LEUKOCYTESUR NEGATIVE 04/24/2021 1032   LEUKOCYTESUR 1+ 11/10/2013 2043    Radiological Exams on Admission: I have personally reviewed images No results found.  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 Serum bicarb at 8 but no DKA. Likely due to volume depletion from N/V/D. Start IV bicarb gtts due to pH of 7.1  Metabolic acidosis Start bicarb gtts.  N&V (nausea and vomiting) Due to food borne illness. Could also be due to metabolic acidosis. Prn zofran.  Diarrhea May be due to food borne illness. Pt states she ate hamburger last week. Next day she developed N/V/D. Pt's mother also had hamburger and developed lesser symptoms but still had N/V/D. Check stool cultures for enterotoxigenic E. Coli.  Leukocytosis- (present on admission) Likely due to N/V/D.  Type 1 diabetes mellitus with stage 4 chronic kidney disease (HCC)- (present on admission) Pt uses Omnipod for her  insulin source. She did not bring the transmitter with her. Pt told to have her mother bring transmitter tomorrow morning or her omnipod will need to be removed  and pt placed on SQ insulin.  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 May need to see nephrology sooner, possibly inpatient consult.  Avoid nonsteroidal anti-inflammatories.  Patient states she was not told that she needs to avoid ibuprofen.  She took a dose of ibuprofen due to a headache this weekend.   DVT prophylaxis: SQ Heparin Code Status: Full Code Family Communication: no family at bedside  Disposition Plan: return home  Consults called: none  Admission status: Inpatient, Step Down Unit   Carollee Herter, DO [facility] 06/20/2021, 3:34 AM

## 2021-06-20 NOTE — Assessment & Plan Note (Signed)
Likely due to N/V/D.

## 2021-06-20 NOTE — ED Notes (Signed)
This nurse discussed with Hospitalist, Dr. Pietro Cassis via telephone call that pt is requesting to leave despite the pt being made aware that she has a room upstairs and that her diet has been changed, as this was a concern of hers. Pt states she would still like to leave and expresses understanding of the risks posed with leaving the hospital against medical advice. Pt signed AMA form with this nurse as a witness.

## 2021-06-20 NOTE — ED Notes (Signed)
Diabetes coordinator at the bedside.

## 2021-06-20 NOTE — ED Notes (Signed)
Patient given sandwich and a diet coke per request.

## 2021-06-20 NOTE — Subjective & Objective (Signed)
CC: N/V/D HPI: 31 year old African-American female with a history of type 1 diabetes since she was [age], history of CKD stage IV, diabetic nephropathy, diabetic neuropathy, presents to the ER today with a 4-day history of nausea vomiting and diarrhea.  Patient states that on Thursday night, she and her mother had hamburger.  Patient woke up on Friday morning with nausea vomiting and diarrhea.  Patient's mother also had similar symptoms but her symptoms resolved within 24 hours.  Patient's continued to have nausea vomiting diarrhea all weekend.  He is having trouble keeping liquids down.  She went to [facility], ER tonight.  He waited several hours.  She had labs drawn but left before being seen.  She came to [facility] for evaluation.  Labs performed at [facility] showed a BUN of 41, creatinine 3.4, glucose of 130, bicarbonate of 8 White count of 13.5, hemoglobin 10.0, platelets 403  Lipase near normal at 59  COVID test was negative.  Beta hydroxybutyrate is negative.  Venous blood gas showed pH of 7.17, PCO2 of 20  Due to patient's acute kidney failure, severe metabolic acidosis, [facility] hospitalist contacted for admission.

## 2021-06-20 NOTE — ED Triage Notes (Signed)
Pt reports with dizziness, nausea, vomiting, and diarrhea since Thursday. Pt states that she may have eaten some bad food.

## 2021-06-20 NOTE — Progress Notes (Signed)
Inpatient Diabetes Program Recommendations  AACE/ADA: New Consensus Statement on Inpatient Glycemic Control (2015)  Target Ranges:  Prepandial:   less than 140 mg/dL      Peak postprandial:   less than 180 mg/dL (1-2 hours)      Critically ill patients:  140 - 180 mg/dL   Lab Results  Component Value Date   GLUCAP 101 (H) 06/20/2021   HGBA1C 8.3 (A) 05/24/2021    Review of Glycemic Control  Diabetes history: DM1 Outpatient Diabetes medications: Insulin pump Current orders for Inpatient glycemic control: Insulin pump managed by pt  HgbA1C - 8.3% Last Endo visit - 05/24/21  Pump settings:  Basal rate:  MN: 0.5, 8AM: 0.7.  I/C: 15, ISF: 65, target: 110 with correction over 120.   Inpatient Diabetes Program Recommendations:    Continue with home insulin pump.  Spoke with pt at bedside about her diabetes. Just recently saw her Endo approx 1 month ago. Also using Dexcom sensor. Pt c/o about food and wants to choose her own food on meal tray. Explained to pt that renal-CHO mod diet has been ordered. States food is horrible and wants to change diet to CHO mod (without renal restriction). Discussed importance of following renal diet and pt said she wouldn't be able to eat if it remained renal. Will inform RN and MD.  Will continue to follow. Insulin pump order set placed by RN and Novolog 0-9 units discontinued.   Will follow while inpatient.  Thank you. Lorenda Peck, RD, LDN, CDE Inpatient Diabetes Coordinator [phone]

## 2021-06-20 NOTE — ED Provider Notes (Signed)
[place] DEPT Provider Note   CSN: [id number] Arrival date & time: 06/20/21  0043     History  Chief Complaint  Patient presents with   Dizziness   Nausea   Emesis   Diarrhea    Ashley Freeman is a 31 y.o. female.  31 year old female with a history of type I DM (on insulin pump), CKD stage IV, hyperlipidemia, anemia presents to the emergency department for complaints of vomiting and diarrhea.  She reports that her symptoms began on Thursday after eating some hamburger meat.  Reports that other individuals in the home had similar symptoms, but "none have lasted as long as mine".  She has had approximately 6 episodes of nonbloody, nonbilious emesis in the past 24 hours.  Reports 9 episodes of watery diarrhea.  She has been voiding less than normal; notes voiding only 1-2 times today.  Notes associated myalgias in her neck and back.  No fevers, abdominal pain, dysuria.  Abdominal history significant for C-section.  The history is provided by the patient. No language interpreter was used.  Dizziness Associated symptoms: diarrhea and vomiting   Emesis Associated symptoms: diarrhea   Diarrhea Associated symptoms: vomiting       Home Medications Prior to Admission medications   Medication Sig Start Date End Date Taking? Authorizing Provider  amLODipine (NORVASC) 10 MG tablet Take 1 tablet (10 mg total) by mouth daily. 03/24/21 06/20/22 Yes Richarda Osmond, MD  carvedilol (COREG) 6.25 MG tablet Take 6.25 mg by mouth 2 (two) times daily. 04/18/21  Yes [provider]  furosemide (LASIX) 20 MG tablet Take 20 mg by mouth 2 (two) times daily. 04/18/21  Yes [provider]  Insulin Disposable Pump (OMNIPOD 5 G6 INTRO, GEN 5,) KIT 1 Device by Does not apply route every 3 (three) days. 05/24/21  Yes Shamleffer, Melanie Crazier, MD  lamoTRIgine (LAMICTAL) 150 MG tablet Take 150 mg by mouth daily. 03/29/21  Yes [provider]  blood glucose meter kit and supplies KIT Dispense based on patient and insurance preference. Use up to four times daily as directed. (FOR ICD-9 250.00, 250.01). 05/25/17   Isla Pence, MD  Blood Pressure Monitoring (BLOOD PRESSURE KIT) DEVI 1 Device by Does not apply route as needed. 05/06/19   Anyanwu, Sallyanne Havers, MD  Continuous Blood Gluc Sensor (DEXCOM G6 SENSOR) MISC Inject 1 Device into the skin as directed. 12/16/20   Shamleffer, Melanie Crazier, MD  Continuous Blood Gluc Transmit (DEXCOM G6 TRANSMITTER) MISC Inject 1 Device into the skin as directed. Use to check blood sugar daily 12/16/20   Shamleffer, Melanie Crazier, MD  famotidine (PEPCID) 20 MG tablet Take 1 tablet (20 mg total) by mouth 2 (two) times daily. Patient not taking: Reported on 06/20/2021 04/05/21 05/24/21  Alma Friendly, MD  insulin aspart (NOVOLOG) 100 UNIT/ML injection Inject 0-6 Units into the skin 3 (three) times daily with meals. Patient taking differently: Inject 0-6 Units into the skin 3 (three) times daily with meals. Sliding scale 04/10/21   [place] Donnamarie Poag, DO  Insulin Disposable Pump (OMNIPOD 5 G6 POD, GEN 5,) MISC 1 Device by Does not apply route every 3 (three) days. 05/24/21   Shamleffer, Melanie Crazier, MD  insulin glargine (LANTUS) 100 UNIT/ML injection Inject 0.1 mLs (10 Units total) into the skin daily. Patient not taking: Reported on 06/20/2021 04/14/21   Kayleen Memos, DO  Insulin Pen Needle 32G X 4 MM MISC Use as directed 10/07/20   Ghimire,  Henreitta Leber, MD  LATUDA 40 MG TABS tablet Take 40 mg by mouth at bedtime. Patient not taking: Reported on 06/20/2021 05/22/21   [provider]  lisinopril (ZESTRIL) 40 MG tablet Take 1 tablet (40 mg total) by mouth daily. 05/26/21   Shamleffer, Melanie Crazier, MD  Ferrous Sulfate (IRON) 325 (65 Fe) MG TABS Take 1 tablet (325 mg total) by mouth every other day. Patient not taking: Reported on 07/25/2020 06/17/19 07/25/20  Chauncey Mann, MD   hydrochlorothiazide (HYDRODIURIL) 25 MG tablet Take 1 tablet (25 mg total) by mouth daily. Patient not taking: No sig reported 10/08/19 07/25/20  Constant, Peggy, MD  promethazine (PHENERGAN) 25 MG tablet Take 1 tablet (25 mg total) by mouth every 8 (eight) hours as needed for nausea or vomiting (if zofran fails). Patient not taking: Reported on 10/06/2020 08/25/20 10/06/20  Varney Biles, MD      Allergies    Cephalexin, Penicillins, Benadryl [diphenhydramine], and Doxycycline    Review of Systems   Review of Systems  Gastrointestinal:  Positive for diarrhea and vomiting.  Neurological:  Positive for dizziness.  Ten systems reviewed and are negative for acute change, except as noted in the HPI.    Physical Exam Updated Vital Signs BP 124/78    Pulse 89    Temp 97.8 F (36.6 C) (Oral)    Resp 13    SpO2 100%   Physical Exam Vitals and nursing note reviewed.  Constitutional:      General: She is not in acute distress.    Appearance: She is well-developed. She is not diaphoretic.     Comments: Pale and ill appearing, but nontoxic.  HENT:     Head: Normocephalic and atraumatic.  Eyes:     General: No scleral icterus.    Conjunctiva/sclera: Conjunctivae normal.  Cardiovascular:     Rate and Rhythm: Normal rate and regular rhythm.     Pulses: Normal pulses.  Pulmonary:     Effort: Pulmonary effort is normal. No respiratory distress.     Breath sounds: No stridor. No wheezing.     Comments: Respirations even and unlabored Abdominal:     Comments: Soft, nondistended, nontender abdomen.  Musculoskeletal:        General: Normal range of motion.     Cervical back: Normal range of motion.  Skin:    General: Skin is warm and dry.     Coloration: Skin is not pale.     Findings: No erythema or rash.  Neurological:     Mental Status: She is alert and oriented to person, place, and time.     Coordination: Coordination normal.  Psychiatric:        Behavior: Behavior normal.    ED  Results / Procedures / Treatments   Labs (all labs ordered are listed, but only abnormal results are displayed) Labs Reviewed  BLOOD GAS, VENOUS - Abnormal; Notable for the following components:      Result Value   pH, Ven 7.17 (*)    pCO2, Ven 20 (*)    pO2, Ven 79 (*)    Bicarbonate 7.3 (*)    Acid-base deficit 19.4 (*)    All other components within normal limits  CK - Abnormal; Notable for the following components:   Total CK 243 (*)    All other components within normal limits  GASTROINTESTINAL PANEL BY PCR, STOOL (REPLACES STOOL CULTURE)  BETA-HYDROXYBUTYRIC ACID  URINALYSIS, ROUTINE W REFLEX MICROSCOPIC  I-STAT BETA HCG BLOOD, ED ([facility],  [facility], [facility] ONLY)    EKG None  Radiology No results found.  Procedures .Critical Care Performed by: Antonietta Breach, PA-C Authorized by: Antonietta Breach, PA-C   Critical care provider statement:    Critical care time (minutes):  35   Critical care was necessary to treat or prevent imminent or life-threatening deterioration of the following conditions:  Renal failure   Critical care was time spent personally by me on the following activities:  Development of treatment plan with patient or surrogate, discussions with consultants, evaluation of patient's response to treatment, examination of patient, ordering and review of laboratory studies, ordering and review of radiographic studies, ordering and performing treatments and interventions, pulse oximetry, re-evaluation of patient's condition and review of old charts   I assumed direction of critical care for this patient from another provider in my specialty: no     Care discussed with: admitting provider      Medications Ordered in ED Medications  sodium chloride 0.9 % bolus 1,000 mL (has no administration in time range)  ondansetron (ZOFRAN) injection 4 mg (has no administration in time range)  sodium bicarbonate 150 mEq in sterile water 1,150 mL infusion (has no administration in time range)   acetaminophen (TYLENOL) tablet 1,000 mg (1,000 mg Oral Given 06/20/21 0201)    ED Course/ Medical Decision Making/ A&P Clinical Course as of 06/20/21 0407  Tue Jun 20, 2021  0134 Labs reviewed from [facility] which were completed about 2 hours ago.  Patient noted to have worsening acidosis, but with normal anion gap; she has known stage IV kidney disease.  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  Creatinine 3.43, up from 2.3 on 05/24/21 and 1.9 on 04/24/21. GFR now 18 from prior baseline of ~40.   Beta hydroxybutyrate added to ensure no associated DKA.  We will also have VBG completed.  Pending urinalysis. [KH]  0209 Venous pH 7.17, per lab [KH]  0246 Normal beta hydroxybutyric acid. CK minimally elevated at 243. [GY]  1749 Spoke with Dr. Bridgett Larsson of [facility] who will assess the patient for admission. [SW]  9675 Per patient she has been referred to nephrology, but first appt not scheduled until March. [KH]    Clinical Course User Index [KH] Antonietta Breach, PA-C                           Medical Decision Making Amount and/or Complexity of Data Reviewed Labs: ordered.  Risk OTC drugs. Prescription drug management. Decision regarding hospitalization.   31 year old female to be admitted for management of metabolic acidosis secondary to acute on chronic renal failure.  Initially presenting to the emergency department for complaints of vomiting and diarrhea.  Notes this began after eating some hamburger meat.  While foodborne illness may be related to symptoms, believe that V/D is more likely related to her worsening acidosis.   Started on IVF in the ED. Bicarb gtt ordered by MD Bridgett Larsson of [facility] who has evaluated the patient in the ED for admission.        Final Clinical Impression(s) / ED Diagnoses Final diagnoses:  Acute renal failure superimposed on chronic kidney disease, unspecified CKD stage, unspecified acute renal failure type (HCC)  Metabolic acidosis   Vomiting and diarrhea    Rx / DC Orders ED Discharge Orders     None         Antonietta Breach, PA-C 91/63/84 [id number]    Lianne Cure, DO  06/21/21 0557 ° °

## 2021-06-20 NOTE — Assessment & Plan Note (Signed)
Pt uses Omnipod for her insulin source. She did not bring the transmitter with her. Pt told to have her mother bring transmitter tomorrow morning or her omnipod will need to be removed and pt placed on SQ insulin.

## 2021-06-20 NOTE — ED Notes (Signed)
Pt ambulatory to and from bathroom with no assistance.

## 2021-06-21 ENCOUNTER — Telehealth: Payer: Self-pay | Admitting: Pharmacy Technician

## 2021-06-21 ENCOUNTER — Other Ambulatory Visit (HOSPITAL_COMMUNITY): Payer: Self-pay

## 2021-06-21 NOTE — Telephone Encounter (Signed)
Patient Advocate Encounter  Received notification from [place] that prior authorization for DEXCOM G6 TRANSMITTER is required.   PA submitted on 2.22.23 Key [id number] Status is pending   [facility] will continue to follow  Luciano Cutter, CPhT Patient Advocate [facility] Phone: [phone] Fax:  [phone]

## 2021-06-26 ENCOUNTER — Ambulatory Visit: Payer: Medicaid Other | Admitting: Nutrition

## 2021-06-29 ENCOUNTER — Other Ambulatory Visit (HOSPITAL_COMMUNITY): Payer: Self-pay

## 2021-07-13 ENCOUNTER — Ambulatory Visit (INDEPENDENT_AMBULATORY_CARE_PROVIDER_SITE_OTHER): Payer: Medicaid Other | Admitting: Family

## 2021-07-13 ENCOUNTER — Other Ambulatory Visit (HOSPITAL_COMMUNITY): Payer: Self-pay

## 2021-07-13 ENCOUNTER — Encounter (HOSPITAL_BASED_OUTPATIENT_CLINIC_OR_DEPARTMENT_OTHER): Payer: Self-pay | Admitting: Family

## 2021-07-13 ENCOUNTER — Other Ambulatory Visit: Payer: Self-pay

## 2021-07-13 VITALS — BP 168/98 | HR 98 | Ht 63.0 in | Wt 126.6 lb

## 2021-07-13 DIAGNOSIS — O24012 Pre-existing diabetes mellitus, type 1, in pregnancy, second trimester: Secondary | ICD-10-CM | POA: Diagnosis not present

## 2021-07-13 DIAGNOSIS — N179 Acute kidney failure, unspecified: Secondary | ICD-10-CM | POA: Diagnosis not present

## 2021-07-13 DIAGNOSIS — I10 Essential (primary) hypertension: Secondary | ICD-10-CM | POA: Diagnosis not present

## 2021-07-13 DIAGNOSIS — N1832 Chronic kidney disease, stage 3b: Secondary | ICD-10-CM | POA: Diagnosis not present

## 2021-07-13 MED ORDER — LISINOPRIL 40 MG PO TABS: 40.0000 mg | ORAL_TABLET | Freq: Every day | ORAL | 3 refills | Status: DC

## 2021-07-13 MED ORDER — DEXCOM G6 TRANSMITTER MISC: 1.0000 | 3 refills | Status: DC

## 2021-07-13 MED ORDER — AMLODIPINE BESYLATE 10 MG PO TABS: 10.0000 mg | ORAL_TABLET | Freq: Every day | ORAL | 3 refills | Status: DC

## 2021-07-13 NOTE — Patient Instructions (Addendum)
Medication Instructions:  ?Your physician has recommended you make the following change in your medication:  ? ?CONTINUE Lisinopril 40mg  daily ? ?RESUME Amlodipine 10mg  daily ? ?*If you need a refill on your cardiac medications before your next appointment, please call your pharmacy* ? ? ?Lab Work: ?Your physician recommends that you return for lab work today: BMP ? ?If you have labs (blood work) drawn today and your tests are completely normal, you will receive your results only by: ?MyChart Message (if you have MyChart) OR ?A paper copy in the mail ?If you have any lab test that is abnormal or we need to change your treatment, we will call you to review the results. ? ? ?Testing/Procedures: ?Your EKG today showed normal sinus rhythm.  ? ?Follow-Up: ?At [facility], you and your health needs are our priority.  As part of our continuing mission to provide you with exceptional heart care, we have created designated Provider Care Teams.  These Care Teams include your primary Cardiologist (physician) and Advanced Practice Providers (APPs -  Physician Assistants and Nurse Practitioners) who all work together to provide you with the care you need, when you need it. ? ?We recommend signing up for the patient portal called "MyChart".  Sign up information is provided on this After Visit Summary.  MyChart is used to connect with patients for Virtual Visits (Telemedicine).  Patients are able to view lab/test results, encounter notes, upcoming appointments, etc.  Non-urgent messages can be sent to your provider as well.   ?To learn more about what you can do with MyChart, go to [email or web address].   ? ?Your next appointment:   ?1 month(s) ? ?The format for your next appointment:   ?In Person or Virtual ? ?Provider:   ?Glenetta Hew, MD or Loel Dubonnet, NP   ? ? ?Other Instructions ? ?Tips to Measure your Blood Pressure Correctly ? ?Our goal is for your blood pressure to be less than 130/80. ?Please check once per  day at least 2 hours after medications.  ?It may take a few changes to get to this goal.  ?Loel Dubonnet, NP will reach out in one week to check on your blood pressure readings.  ? ?Here's what you can do to ensure a correct reading: ? Don't drink a caffeinated beverage or smoke during the 30 minutes before the test. ? Sit quietly for five minutes before the test begins. ? During the measurement, sit in a chair with your feet on the floor and your arm supported so your elbow is at about heart level. ? The inflatable part of the cuff should completely cover at least 80% of your upper arm, and the cuff should be placed on bare skin, not over a shirt. ? Don't talk during the measurement. ? ? ?Blood pressure categories  ?Blood pressure category SYSTOLIC ?(upper number)  DIASTOLIC ?(lower number)  ?Normal Less than 120 mm Hg and Less than 80 mm Hg  ?Elevated 120-129 mm Hg and Less than 80 mm Hg  ?High blood pressure: Stage 1 hypertension 130-139 mm Hg or 80-89 mm Hg  ?High blood pressure: Stage 2 hypertension 140 mm Hg or higher or 90 mm Hg or higher  ?Hypertensive crisis (consult your doctor immediately) Higher than 180 mm Hg and/or Higher than 120 mm Hg  ?Source: American Heart Association and American Stroke Association. ?For more on getting your blood pressure under control, buy Controlling Your Blood Pressure, a Special Health Report from [facility]. ? ? ?Blood  Pressure Log ? ? ?Date ?  ?Time  ?Blood Pressure  ?Position  ?Example: Nov 1 9 AM 124/78 sitting  ? ?     ? ?     ? ?     ? ?     ? ?     ? ?     ? ?     ? ?     ? ? ?   ?

## 2021-07-13 NOTE — Progress Notes (Signed)
? ?Office Visit  ?  ?Patient Name: Ashley Freeman ?Date of Encounter: 07/13/2021 ? ?PCP:  Ashley Freeman ?  ?[place]  ?Cardiologist:  Ashley Hew, MD  ?Advanced Practice Provider:  No care team member to display ?Electrophysiologist:  None  ? ?Chief Complaint  ?  ?Ashley Freeman is a 31 y.o. female with a hx of type 1 diabetes (diagnosed at age 38) with recurrent DKA, hyperlipidemia, anemia, AKI, CKD 3b,hypertension presents today for follow-up after hospitalization for DKA ? ?Past Medical History  ?  ?Past Medical History:  ?Diagnosis Date  ? Abscess, gluteal, right 08/24/2013  ? AKI (acute kidney injury) ([place]) 07/26/2014  ? Anemia 02/19/2012  ? Bartholin's gland abscess 09/19/2013  ? BV (bacterial vaginosis) 11/24/2015  ? Diabetes mellitus type I ([place]) 2001  ? Diagnosed at age 80 ; Type I  ? Diarrhea 05/30/2016  ? DKA (diabetic ketoacidoses) 08/19/2013  ? Also Freeman 2018  ? Gonorrhea 08/2011  ? Treated Freeman 09/2011  ? History of trichomoniasis 05/31/2016  ? Hyperlipidemia 03/28/2016  ? Sepsis ([place]) 09/19/2013  ? ?Past Surgical History:  ?Procedure Laterality Date  ? CESAREAN SECTION N/A 10/05/2019  ? Procedure: CESAREAN SECTION;  Surgeon: Ashley Halim, MD;  Location: [facility];  Service: Obstetrics;  Laterality: N/A;  ? INCISION AND DRAINAGE ABSCESS Left 09/28/2019  ? Procedure: INCISION AND DRAINAGE VULVAR ABCESS;  Surgeon: Ashley Kind, MD;  Location: [place];  Service: Gynecology;  Laterality: Left;  ? INCISION AND DRAINAGE PERIRECTAL ABSCESS Right 08/18/2013  ? Procedure: IRRIGATION AND DEBRIDEMENT GLUTEAL ABSCESS;  Surgeon: Ashley Ok, MD;  Location: [place];  Service: General;  Laterality: Right;  ? INCISION AND DRAINAGE PERIRECTAL ABSCESS Right 09/19/2013  ? Procedure: IRRIGATION AND DEBRIDEMENT RIGHT GLUTEAL AND LABIAL ABSCESSES;  Surgeon: Ashley Ok, MD;  Location: [place];  Service: General;  Laterality: Right;  ? INCISION AND DRAINAGE PERIRECTAL  ABSCESS Right 09/24/2013  ? Procedure: IRRIGATION AND DEBRIDEMENT PERIRECTAL ABSCESS;  Surgeon: Ashley Ober, MD;  Location: [place];  Service: General;  Laterality: Right;  ? ?Allergies ? ?Allergies  ?Allergen Reactions  ? Cephalexin Anaphylaxis  ?  Has gotten ceftriaxone Freeman the past   ? Penicillins Hives and Rash  ?  Has patient had a PCN reaction causing immediate rash, facial/tongue/throat swelling, SOB or lightheadedness with hypotension: Yes ?Has patient had a PCN reaction causing severe rash involving mucus membranes or skin necrosis: No ?Has patient had a PCN reaction that required hospitalization: Yes ?Has patient had a PCN reaction occurring within the last 10 years: No ?Spoke with pt - childhood hives told by mom, tried no pcns since, doesn't remember reaction herself   ? Benadryl [Diphenhydramine] Itching  ? Doxycycline Itching  ? ? ?History of Present Illness  ?  ?Ashley Freeman is a 31 y.o. female with a hx of  type 1 diabetes (diagnosed at age 65) with recurrent DKA, hyperlipidemia, anemia, AKI, CKD 3b,hypertension  last seen during hospitalization 03/2021. ? ?Was referred to cardiology during pregnancy due to elevated blood pressure and evaluated by Ashley Freeman 08/03/2019.  Her blood pressure Freeman clinic was 138/90 though well controlled when seen by other providers.  She was recommended to monitor blood pressure at home and report if consistently greater than 130/80.  Echocardiogram ordered revealing normal LVEF 60 to 65%, no RWMA, no significant valvular abnormalities.  ?   ?She has had recurrent admissions with diabetic ketoacidosis including admission May 2022, June  2022, August 2022, November 2022 x 2 (including admission for COVID-10), December 2022 x 3.  ? ?She was admitted 04/08/2021 with DKA and cardiology consulted for evaluation of chest pain which was suspected to be demand ischemia Freeman the setting of DKA.  Echo 04/01/2021 LVEF 65 to 70%, no RWMA, mildly elevated PASP, trivial MR.   Repeat echo 04/08/2021 EF 55 to 60%, no R WMA, small pericardial effusion with no evidence of tamponade, trivial MR. ? ?Admitted to [facility]/16/21 for anasarca. Albumin 1.8 and provided IV albumin and Lasix. UPC consistent with nephrotic range proteinuria. Nephrology started Lisinopril and discharged on Lasix. Echo during that admission with LVEF 40% and Amlodipine transitioned to Carvedilol. ? ?ED visit 04/24/21 with hypoglycemia associated with seizures.  ? ?Presented to ED 06/20/21 with nausea, vomiting, diarrhea after eating hamburger diagnosed with acute gastroenteritis and dehydration. She had AKI on CKD 3b with creatinine 3.43 (baseline 2-2.3) which improved with IV fluids. She left AMA. ? ?She presents today for follow up with her significant other.  Recently started working at a call center.  She has a 11-year-old daughter.  She has been taking her lisinopril but not carvedilol, furosemide, amlodipine as she ran out of these medications.  She has admitted that she does not have a primary care provider at this time but does follow with Ashley Freeman of endocrinology closely.  She notes some residual deconditioning since hospital discharge and feels her legs are weak - for example feels tired with ambulating up stairs.  She reports no chest pain, shortness of breath at rest, edema, orthopnea, PND.  Recently located her wrist blood pressure cuff but has not been using.  Blood sugars have been running high at home and endorses dietary indiscretion. Tells me she had a couple of "seizures" while on her blood pressure medications and we discussed that this is not a common side effect of blood pressure medications and were consistent with hypoglycemia.  Lamictal is on her medication list but she is not currently taking as she has run out. ? ?EKGs/Labs/Other Studies Reviewed:  ? ?The following studies were reviewed today: ?ECHO: 04/08/2021 ? 1. Left ventricular ejection fraction, by estimation, is 55 to 60%. The  ?left  ventricle has normal function. The left ventricle has no regional  ?wall motion abnormalities. Left ventricular diastolic parameters were  ?normal.  ? 2. Right ventricular systolic function is normal. The right ventricular  ?size is normal. Tricuspid regurgitation signal is inadequate for assessing PA pressure.  ? 3. A small pericardial effusion is present. The pericardial effusion is  ?circumferential. There is no evidence of cardiac tamponade. (New compared to 04/01/2021 echo) ? 4. The mitral valve is grossly normal. Trivial mitral valve  ?regurgitation. No evidence of mitral stenosis.  ? 5. The aortic valve is tricuspid. Aortic valve regurgitation is not  ?visualized. No aortic stenosis is present.  ? 6. The inferior vena cava is normal Freeman size with greater than 50%  ?respiratory variability, suggesting right atrial pressure of 3 mmHg.  ? ?Echo 04/18/21 ?Summary  ? 1. The left ventricle is upper normal Freeman size with normal wall thickness.  ?  2. The left ventricular systolic function is moderately decreased, LVEF is  ?visually estimated at 40%.  ?  3. The right ventricle is normal Freeman size, with normal systolic function.  ?  4. Limited study to assess ventricular function.  ? ?EKG:  EKG is ordered today.  The ekg ordered today demonstrates SR 98 bpm with short  PR 169m. No acute ST/T wave changes.  ? ?Recent Labs: ?03/31/2021: TSH 3.642 ?04/13/2021: B Natriuretic Peptide 963.0 ?06/20/2021: ALT 14; BUN 41; Creatinine, Ser 3.01; Hemoglobin 8.6; Magnesium 2.0; Platelets 345; Potassium 3.5; Sodium 139  ?Recent Lipid Panel ?   ?Component Value Date/Time  ? CHOL 235 (H) 05/24/2021 1552  ? TRIG 157.0 (H) 05/24/2021 1552  ? HDL 54.70 05/24/2021 1552  ? CHOLHDL 4 05/24/2021 1552  ? VLDL 31.4 05/24/2021 1552  ? LDLCALC 149 (H) 05/24/2021 1552  ? ? ?Home Medications  ? ?Current Meds  ?Medication Sig  ? blood glucose meter kit and supplies KIT Dispense based on patient and insurance preference. Use up to four times daily as  directed. (FOR ICD-9 250.00, 250.01).  ? Blood Pressure Monitoring (BLOOD PRESSURE KIT) DEVI 1 Device by Does not apply route as needed.  ? Continuous Blood Gluc Sensor (DEXCOM G6 SENSOR) MISC Inject 1 D

## 2021-07-14 ENCOUNTER — Telehealth (HOSPITAL_BASED_OUTPATIENT_CLINIC_OR_DEPARTMENT_OTHER): Payer: Self-pay

## 2021-07-14 ENCOUNTER — Other Ambulatory Visit (HOSPITAL_COMMUNITY): Payer: Self-pay

## 2021-07-14 ENCOUNTER — Telehealth (HOSPITAL_COMMUNITY): Payer: Self-pay | Admitting: Licensed Clinical Social Worker

## 2021-07-14 DIAGNOSIS — E875 Hyperkalemia: Secondary | ICD-10-CM

## 2021-07-14 LAB — BASIC METABOLIC PANEL
BUN/Creatinine Ratio: 19 (ref 9–23)
BUN: 51 mg/dL — ABNORMAL HIGH (ref 6–20)
CO2: 14 mmol/L — ABNORMAL LOW (ref 20–29)
Calcium: 8.9 mg/dL (ref 8.7–10.2)
Chloride: 110 mmol/L — ABNORMAL HIGH (ref 96–106)
Creatinine, Ser: 2.69 mg/dL — ABNORMAL HIGH (ref 0.57–1.00)
Glucose: 203 mg/dL — ABNORMAL HIGH (ref 70–99)
Potassium: 5.9 mmol/L — ABNORMAL HIGH (ref 3.5–5.2)
Sodium: 136 mmol/L (ref 134–144)
eGFR: 24 mL/min/{1.73_m2} — ABNORMAL LOW (ref >59)

## 2021-07-14 MED ORDER — CARVEDILOL 12.5 MG PO TABS: 12.5000 mg | ORAL_TABLET | Freq: Two times a day (BID) | ORAL | 3 refills | Status: DC

## 2021-07-14 NOTE — Progress Notes (Signed)
?Heart and Vascular Care Navigation ? ?07/14/2021 ? ?Ashley Freeman ?1990-08-23 ?[id number] ? ?Reason for Referral: financial concerns with obtaining medications ?  ?Engaged with patient by telephone for initial visit for Heart and Vascular Care Coordination. ?                                                                                                  ?Assessment: CSW reached out to patient to discuss above concerns.  Pt reports that her medications are not all covered by her insurance and this creates problems with her affording all of her medications. ? ?Pt reports her insulin and insulin supplies is covered but not her blood pressure medication (lisinopril).  CSW called pharmacy to inquire what was happening and they said the pt filled the medication end of February for a 90 day supply for only $4 and no concerns with insurance covering.  CSW called pt back and informed of above- she still reports potential concerns with getting her sleep medication covered but can't provide details of name of medication or cost.  CSW encouraged pt to consider changing pharmacies to one that can waive copays if the medicaid copays are too much for pt to cover- informed her of a pharmacy that will waive copays if she is interested. ? ?Completed SDOH questionairre to assess for other concerns.  Pt states she is working on going into a new apartment and has some financial concerns about the deposit cost.  States she has to put down $1647 and only has $300 at this as she had to stop work on and off with recent medical concerns.  Will get her first paycheck in awhile shortly and then will assess how much she can put towards that deposit- encouraged her to reach out if she needs help with the deposit after getting her paycheck and we could assess for potential Patient Care Fund assistance                                  ? ?HRT/VAS Care Coordination   ? ? Living arrangements for the past 2 months Single Family Home  ?  Lives with: Parents  ? Home Assistive Devices/Equipment Eyeglasses  ? DME Agency NA  ? [place] Agency NA  ? ?  ? ? ?Social History:                                                                             ?SDOH Screenings  ? ?Alcohol Screen: Not on file  ?Depression (PHQ2-9): Not on file  ?Financial Resource Strain: High Risk  ? Difficulty of Paying Living Expenses: Hard  ?Food Insecurity: No Food Insecurity  ? Worried About Charity fundraiser in the Last Year: Never true  ?  Ran Out of Food in the Last Year: Never true  ?Housing: Low Risk   ? Last Housing Risk Score: 0  ?Physical Activity: Not on file  ?Social Connections: Not on file  ?Stress: Not on file  ?Tobacco Use: Low Risk   ? Smoking Tobacco Use: Never  ? Smokeless Tobacco Use: Never  ? Passive Exposure: Not on file  ?Transportation Needs: No Transportation Needs  ? Lack of Transportation (Medical): No  ? Lack of Transportation (Non-Medical): No  ? ? ?SDOH Interventions: ?Financial Resources:    ?Just started back working- shouldn't have significant financial concerns moving forward  ?Food Insecurity:  Food Insecurity Interventions: Intervention Not Indicated  ?Housing Insecurity:  Housing Interventions: Other (Comment) (referred to Patient Care fund for start up cost assistance)  ?Transportation:   Transportation Interventions: Intervention Not Indicated  ? ? ?Follow-up plan:   ? ?No further needs at this time.  Pt to transfer medications to new pharmacy if copays continue to be a concern.  Pt to reach out if she needs assistance with security deposit costs. ? ?Will continue to follow and assist as needed ? ?Jorge Ny, LCSW ?Clinical Social Worker ?[facility] ?Desk#: [phone] ?Cell#: [phone] ? ? ? ?

## 2021-07-14 NOTE — Telephone Encounter (Addendum)
RN called patient and reviewed recommendations per provider request. Patient verbalizes understanding, is agreeable to new medication and will return for lab work on Monday!  ? ?RN called and updated pharmacy- Lisinopril removed from patient list ? ? ? ? ? ? ?----- Message from Loel Dubonnet, NP sent at 07/14/2021  1:06 PM EDT ----- ?Please call patient with results. Kidney function remains decreased. Potassium elevated. STOP Lisinopril (please call pharmacy to stop further fills). Continue Amlodipine. START Carvedilol 12.5mg  twice daily to help with BP control. Repeat BMP on Monday for monitoring of potassium. ?

## 2021-07-14 NOTE — Telephone Encounter (Signed)
Patient Advocate Encounter ? ?Prior Authorization for [employer] has been approved.   ? ?PA# N/A ? ?Effective dates: 07/13/21 through 01/13/22 ? ?Per Test Claim Patients co-pay is $0.  ? ?Spoke with Pharmacy to Process. ? ?Patient Advocate ?Fax: [phone]  ?

## 2021-07-18 NOTE — Addendum Note (Signed)
Addended by: Gerald Stabs on: 07/18/2021 09:00 AM ? ? Modules accepted: Orders ? ?

## 2021-07-24 ENCOUNTER — Encounter (HOSPITAL_BASED_OUTPATIENT_CLINIC_OR_DEPARTMENT_OTHER): Payer: Self-pay

## 2021-07-24 ENCOUNTER — Encounter: Payer: Self-pay | Admitting: Internal Medicine

## 2021-07-25 ENCOUNTER — Other Ambulatory Visit: Payer: Self-pay

## 2021-07-25 MED ORDER — OMNIPOD 5 DEXG7G6 PODS GEN 5 MISC: 1.0000 | 2 refills | Status: DC

## 2021-07-26 ENCOUNTER — Inpatient Hospital Stay (HOSPITAL_COMMUNITY): Payer: Medicaid Other

## 2021-07-26 ENCOUNTER — Inpatient Hospital Stay (HOSPITAL_COMMUNITY)
Admission: EM | Admit: 2021-07-26 | Discharge: 2021-07-27 | DRG: 638 | Disposition: A | Discharge status: Home / Self Care | Payer: Medicaid Other | Attending: Internal Medicine | Admitting: Internal Medicine

## 2021-07-26 ENCOUNTER — Encounter (HOSPITAL_COMMUNITY): Payer: Self-pay | Admitting: Emergency Medicine

## 2021-07-26 ENCOUNTER — Other Ambulatory Visit: Payer: Self-pay

## 2021-07-26 DIAGNOSIS — Z833 Family history of diabetes mellitus: Secondary | ICD-10-CM | POA: Diagnosis not present

## 2021-07-26 DIAGNOSIS — E785 Hyperlipidemia, unspecified: Secondary | ICD-10-CM | POA: Diagnosis present

## 2021-07-26 DIAGNOSIS — E1065 Type 1 diabetes mellitus with hyperglycemia: Secondary | ICD-10-CM | POA: Diagnosis not present

## 2021-07-26 DIAGNOSIS — Z880 Allergy status to penicillin: Secondary | ICD-10-CM | POA: Diagnosis not present

## 2021-07-26 DIAGNOSIS — E1110 Type 2 diabetes mellitus with ketoacidosis without coma: Secondary | ICD-10-CM | POA: Diagnosis present

## 2021-07-26 DIAGNOSIS — Z794 Long term (current) use of insulin: Secondary | ICD-10-CM | POA: Diagnosis not present

## 2021-07-26 DIAGNOSIS — Z881 Allergy status to other antibiotic agents status: Secondary | ICD-10-CM

## 2021-07-26 DIAGNOSIS — E1159 Type 2 diabetes mellitus with other circulatory complications: Secondary | ICD-10-CM

## 2021-07-26 DIAGNOSIS — I10 Essential (primary) hypertension: Secondary | ICD-10-CM | POA: Diagnosis not present

## 2021-07-26 DIAGNOSIS — N179 Acute kidney failure, unspecified: Secondary | ICD-10-CM | POA: Diagnosis present

## 2021-07-26 DIAGNOSIS — I129 Hypertensive chronic kidney disease with stage 1 through stage 4 chronic kidney disease, or unspecified chronic kidney disease: Secondary | ICD-10-CM | POA: Diagnosis present

## 2021-07-26 DIAGNOSIS — N184 Chronic kidney disease, stage 4 (severe): Secondary | ICD-10-CM | POA: Diagnosis present

## 2021-07-26 DIAGNOSIS — E1010 Type 1 diabetes mellitus with ketoacidosis without coma: Secondary | ICD-10-CM | POA: Diagnosis present

## 2021-07-26 DIAGNOSIS — R112 Nausea with vomiting, unspecified: Secondary | ICD-10-CM | POA: Diagnosis not present

## 2021-07-26 DIAGNOSIS — Z9641 Presence of insulin pump (external) (internal): Secondary | ICD-10-CM | POA: Diagnosis present

## 2021-07-26 DIAGNOSIS — E1022 Type 1 diabetes mellitus with diabetic chronic kidney disease: Secondary | ICD-10-CM | POA: Diagnosis present

## 2021-07-26 DIAGNOSIS — Z79899 Other long term (current) drug therapy: Secondary | ICD-10-CM

## 2021-07-26 LAB — URINALYSIS, ROUTINE W REFLEX MICROSCOPIC
Bilirubin Urine: NEGATIVE
Glucose, UA: >=500 mg/dL — AB
Ketones, ur: 20 mg/dL — AB
Leukocytes,Ua: NEGATIVE
Nitrite: NEGATIVE
Protein, ur: >=300 mg/dL — AB
Specific Gravity, Urine: 1.014 (ref 1.005–1.030)
pH: 5 (ref 5.0–8.0)

## 2021-07-26 LAB — COMPREHENSIVE METABOLIC PANEL
ALT: 25 U/L (ref 0–44)
AST: 25 U/L (ref 15–41)
Albumin: 3 g/dL — ABNORMAL LOW (ref 3.5–5.0)
Alkaline Phosphatase: 76 U/L (ref 38–126)
Anion gap: 12 (ref 5–15)
BUN: 58 mg/dL — ABNORMAL HIGH (ref 6–20)
CO2: 9 mmol/L — ABNORMAL LOW (ref 22–32)
Calcium: 8.8 mg/dL — ABNORMAL LOW (ref 8.9–10.3)
Chloride: 110 mmol/L (ref 98–111)
Creatinine, Ser: 3.64 mg/dL — ABNORMAL HIGH (ref 0.44–1.00)
GFR, Estimated: 17 mL/min — ABNORMAL LOW (ref >60)
Glucose, Bld: 732 mg/dL (ref 70–99)
Potassium: 4.8 mmol/L (ref 3.5–5.1)
Sodium: 131 mmol/L — ABNORMAL LOW (ref 135–145)
Total Bilirubin: 0.5 mg/dL (ref 0.3–1.2)
Total Protein: 6.7 g/dL (ref 6.5–8.1)

## 2021-07-26 LAB — BASIC METABOLIC PANEL
Anion gap: 9 (ref 5–15)
Anion gap: 6 (ref 5–15)
BUN: 55 mg/dL — ABNORMAL HIGH (ref 6–20)
BUN: 52 mg/dL — ABNORMAL HIGH (ref 6–20)
CO2: 13 mmol/L — ABNORMAL LOW (ref 22–32)
CO2: 14 mmol/L — ABNORMAL LOW (ref 22–32)
Calcium: 9 mg/dL (ref 8.9–10.3)
Calcium: 8.5 mg/dL — ABNORMAL LOW (ref 8.9–10.3)
Chloride: 114 mmol/L — ABNORMAL HIGH (ref 98–111)
Chloride: 116 mmol/L — ABNORMAL HIGH (ref 98–111)
Creatinine, Ser: 3.55 mg/dL — ABNORMAL HIGH (ref 0.44–1.00)
Creatinine, Ser: 3.19 mg/dL — ABNORMAL HIGH (ref 0.44–1.00)
GFR, Estimated: 17 mL/min — ABNORMAL LOW (ref >60)
GFR, Estimated: 19 mL/min — ABNORMAL LOW (ref >60)
Glucose, Bld: 357 mg/dL — ABNORMAL HIGH (ref 70–99)
Glucose, Bld: 137 mg/dL — ABNORMAL HIGH (ref 70–99)
Potassium: 3.8 mmol/L (ref 3.5–5.1)
Potassium: 3.9 mmol/L (ref 3.5–5.1)
Sodium: 136 mmol/L (ref 135–145)
Sodium: 136 mmol/L (ref 135–145)

## 2021-07-26 LAB — GLUCOSE, CAPILLARY
Glucose-Capillary: 402 mg/dL — ABNORMAL HIGH (ref 70–99)
Glucose-Capillary: 351 mg/dL — ABNORMAL HIGH (ref 70–99)
Glucose-Capillary: 250 mg/dL — ABNORMAL HIGH (ref 70–99)
Glucose-Capillary: 164 mg/dL — ABNORMAL HIGH (ref 70–99)
Glucose-Capillary: 144 mg/dL — ABNORMAL HIGH (ref 70–99)
Glucose-Capillary: 141 mg/dL — ABNORMAL HIGH (ref 70–99)
Glucose-Capillary: 107 mg/dL — ABNORMAL HIGH (ref 70–99)

## 2021-07-26 LAB — CBG MONITORING, ED
Glucose-Capillary: >600 mg/dL (ref 70–99)
Glucose-Capillary: 456 mg/dL — ABNORMAL HIGH (ref 70–99)

## 2021-07-26 LAB — CBC
HCT: 31.1 % — ABNORMAL LOW (ref 36.0–46.0)
Hemoglobin: 9.5 g/dL — ABNORMAL LOW (ref 12.0–15.0)
MCH: 27.4 pg (ref 26.0–34.0)
MCHC: 30.5 g/dL (ref 30.0–36.0)
MCV: 89.6 fL (ref 80.0–100.0)
Platelets: 359 10*3/uL (ref 150–400)
RBC: 3.47 MIL/uL — ABNORMAL LOW (ref 3.87–5.11)
RDW: 14.4 % (ref 11.5–15.5)
WBC: 12.9 10*3/uL — ABNORMAL HIGH (ref 4.0–10.5)
nRBC: 0 % (ref 0.0–0.2)

## 2021-07-26 LAB — I-STAT BETA HCG BLOOD, ED (MC, WL, AP ONLY): I-stat hCG, quantitative: <5 m[IU]/mL (ref <5)

## 2021-07-26 LAB — BLOOD GAS, VENOUS
Acid-base deficit: 15 mmol/L — ABNORMAL HIGH (ref 0.0–2.0)
Bicarbonate: 11.7 mmol/L — ABNORMAL LOW (ref 20.0–28.0)
O2 Saturation: 91.4 %
Patient temperature: 37
pCO2, Ven: 30 mmHg — ABNORMAL LOW (ref 44–60)
pH, Ven: 7.2 — ABNORMAL LOW (ref 7.25–7.43)
pO2, Ven: 56 mmHg — ABNORMAL HIGH (ref 32–45)

## 2021-07-26 LAB — HEMOGLOBIN A1C
Hgb A1c MFr Bld: 8 % — ABNORMAL HIGH (ref 4.8–5.6)
Mean Plasma Glucose: 182.9 mg/dL

## 2021-07-26 LAB — BETA-HYDROXYBUTYRIC ACID
Beta-Hydroxybutyric Acid: 1.16 mmol/L — ABNORMAL HIGH (ref 0.05–0.27)
Beta-Hydroxybutyric Acid: 0.14 mmol/L (ref 0.05–0.27)

## 2021-07-26 LAB — LIPASE, BLOOD: Lipase: 40 U/L (ref 11–51)

## 2021-07-26 LAB — MRSA NEXT GEN BY PCR, NASAL: MRSA by PCR Next Gen: NOT DETECTED

## 2021-07-26 MED ORDER — INSULIN REGULAR(HUMAN) IN NACL 100-0.9 UT/100ML-% IV SOLN
INTRAVENOUS | Status: DC
  Administered 2021-07-26: 4.6 [IU]/h via INTRAVENOUS
  Filled 2021-07-26: qty 100

## 2021-07-26 MED ORDER — CHLORHEXIDINE GLUCONATE CLOTH 2 % EX PADS
6.0000 | MEDICATED_PAD | Freq: Every day | CUTANEOUS | Status: DC
  Administered 2021-07-26 – 2021-07-27 (×2): 6 via TOPICAL

## 2021-07-26 MED ORDER — METOPROLOL TARTRATE 5 MG/5ML IV SOLN
5.0000 mg | Freq: Four times a day (QID) | INTRAVENOUS | Status: DC | PRN
  Administered 2021-07-26: 5 mg via INTRAVENOUS
  Filled 2021-07-26: qty 5

## 2021-07-26 MED ORDER — LACTATED RINGERS IV BOLUS
1000.0000 mL | Freq: Once | INTRAVENOUS | Status: AC
  Administered 2021-07-26: 1000 mL via INTRAVENOUS

## 2021-07-26 MED ORDER — DEXTROSE 50 % IV SOLN: 0.0000 mL | INTRAVENOUS | Status: DC | PRN

## 2021-07-26 MED ORDER — HYDRALAZINE HCL 20 MG/ML IJ SOLN
10.0000 mg | Freq: Three times a day (TID) | INTRAMUSCULAR | Status: DC | PRN
Start: 2021-07-26 — End: 2021-07-27
  Administered 2021-07-26 – 2021-07-27 (×2): 10 mg via INTRAVENOUS
  Filled 2021-07-26 (×2): qty 1

## 2021-07-26 MED ORDER — POTASSIUM CHLORIDE 10 MEQ/100ML IV SOLN
10.0000 meq | INTRAVENOUS | Status: AC
  Administered 2021-07-26: 10 meq via INTRAVENOUS
  Filled 2021-07-26: qty 100

## 2021-07-26 MED ORDER — LACTATED RINGERS IV SOLN: INTRAVENOUS | Status: DC

## 2021-07-26 MED ORDER — ACETAMINOPHEN 325 MG PO TABS
650.0000 mg | ORAL_TABLET | Freq: Four times a day (QID) | ORAL | Status: DC | PRN
  Administered 2021-07-26 – 2021-07-27 (×3): 650 mg via ORAL
  Filled 2021-07-26 (×3): qty 2

## 2021-07-26 MED ORDER — AMLODIPINE BESYLATE 10 MG PO TABS
10.0000 mg | ORAL_TABLET | Freq: Every day | ORAL | Status: DC
  Administered 2021-07-26 – 2021-07-27 (×2): 10 mg via ORAL
  Filled 2021-07-26 (×2): qty 1

## 2021-07-26 MED ORDER — DEXTROSE IN LACTATED RINGERS 5 % IV SOLN: INTRAVENOUS | Status: DC

## 2021-07-26 NOTE — ED Provider Notes (Signed)
?[facility] DEPT ?Provider Note ? ? ?CSN: [id number] ?Arrival date & time: 07/26/21  1200 ? ?  ? ?History ? ?Chief Complaint  ?Patient presents with  ? Emesis  ? Fatigue  ? Hyperglycemia  ? ? ?Ashley Freeman is a 31 y.o. female.  Presenting to the emergency department with concern for going into DKA.  States over the past day or 2 she has had increasing nausea and vomiting.  This morning vomiting a lot.  Feeling very fatigued since yesterday.  States that she does not believe that her insulin pump is malfunctioning.  He uses a pod.  States that she currently does not feel nauseous, does not have abdominal pain or chest pain.  Vomit earlier today was nonbloody nonbilious.  Feels similar to prior episodes of DKA. ? ?Completed chart review, last admission to hospital was 4 DKA.  December 2022.  Reviewed discharge summary. ? ?HPI ? ?  ? ?Home Medications ?Prior to Admission medications   ?Medication Sig Start Date End Date Taking? Authorizing Provider  ?amLODipine (NORVASC) 10 MG tablet Take 1 tablet (10 mg total) by mouth daily. 07/13/21 07/08/22  Loel Dubonnet, NP  ?blood glucose meter kit and supplies KIT Dispense based on patient and insurance preference. Use up to four times daily as directed. (FOR ICD-9 250.00, 250.01). 05/25/17   Isla Pence, MD  ?Blood Pressure Monitoring (BLOOD PRESSURE KIT) DEVI 1 Device by Does not apply route as needed. 05/06/19   Anyanwu, Sallyanne Havers, MD  ?carvedilol (COREG) 12.5 MG tablet Take 1 tablet (12.5 mg total) by mouth 2 (two) times daily. 07/14/21 10/12/21  Loel Dubonnet, NP  ?Continuous Blood Gluc Sensor (DEXCOM G6 SENSOR) MISC Inject 1 Device into the skin as directed. 12/16/20   Shamleffer, Melanie Crazier, MD  ?Continuous Blood Gluc Transmit (DEXCOM G6 TRANSMITTER) MISC Inject 1 Device into the skin as directed. Use to check blood sugar daily 07/13/21   Shamleffer, Melanie Crazier, MD  ?insulin aspart (NOVOLOG) 100 UNIT/ML injection  Inject 0-6 Units into the skin 3 (three) times daily with meals. ?Patient taking differently: Inject 0-6 Units into the skin 3 (three) times daily with meals. Sliding scale 04/10/21   [place] Eric J, DO  ?Insulin Disposable Pump (OMNIPOD 5 G6 INTRO, GEN 5,) KIT 1 Device by Does not apply route every 3 (three) days. 05/24/21   Shamleffer, Melanie Crazier, MD  ?Insulin Disposable Pump (OMNIPOD 5 G6 POD, GEN 5,) MISC 1 Device by Does not apply route every 3 (three) days. 05/24/21   Shamleffer, Melanie Crazier, MD  ?Insulin Disposable Pump (OMNIPOD 5 G6 POD, GEN 5,) MISC 1 Device by Does not apply route every 3 (three) days. 07/25/21   Shamleffer, Melanie Crazier, MD  ?insulin glargine (LANTUS) 100 UNIT/ML injection Inject 0.1 mLs (10 Units total) into the skin daily. ?Patient not taking: Reported on 06/20/2021 04/14/21   Kayleen Memos, DO  ?Insulin Pen Needle 32G X 4 MM MISC Use as directed 10/07/20   Ghimire, Henreitta Leber, MD  ?lamoTRIgine (LAMICTAL) 150 MG tablet Take 150 mg by mouth daily. ?Patient not taking: Reported on 07/13/2021 03/29/21   [provider]  ?   ? ?Allergies    ?Cephalexin, Penicillins, Benadryl [diphenhydramine], and Doxycycline   ? ?Review of Systems   ?Review of Systems  ?Constitutional:  Positive for fatigue. Negative for chills and fever.  ?HENT:  Negative for ear pain and sore throat.   ?Eyes:  Negative for pain and visual disturbance.  ?  Respiratory:  Negative for cough and shortness of breath.   ?Cardiovascular:  Negative for chest pain and palpitations.  ?Gastrointestinal:  Positive for nausea and vomiting. Negative for abdominal pain.  ?Genitourinary:  Negative for dysuria and hematuria.  ?Musculoskeletal:  Negative for arthralgias and back pain.  ?Skin:  Negative for color change and rash.  ?Neurological:  Negative for seizures and syncope.  ?All other systems reviewed and are negative. ? ?Physical Exam ?Updated Vital Signs ?BP (!) 178/111   Pulse (!) 101   Temp 98 ?F (36.7 ?C)    Resp 15   SpO2 100%  ?Physical Exam ?Vitals and nursing note reviewed.  ?Constitutional:   ?   General: She is not in acute distress. ?   Appearance: She is well-developed.  ?HENT:  ?   Head: Normocephalic and atraumatic.  ?Eyes:  ?   Conjunctiva/sclera: Conjunctivae normal.  ?Cardiovascular:  ?   Rate and Rhythm: Regular rhythm. Tachycardia present.  ?   Heart sounds: No murmur heard. ?Pulmonary:  ?   Effort: Pulmonary effort is normal. No respiratory distress.  ?   Breath sounds: Normal breath sounds.  ?Abdominal:  ?   Palpations: Abdomen is soft.  ?   Tenderness: There is no abdominal tenderness.  ?Musculoskeletal:     ?   General: No swelling.  ?   Cervical back: Neck supple.  ?Skin: ?   General: Skin is warm and dry.  ?   Capillary Refill: Capillary refill takes less than 2 seconds.  ?Neurological:  ?   Mental Status: She is alert.  ?Psychiatric:     ?   Mood and Affect: Mood normal.  ? ? ?ED Results / Procedures / Treatments   ?Labs ?(all labs ordered are listed, but only abnormal results are displayed) ?Labs Reviewed  ?URINALYSIS, ROUTINE W REFLEX MICROSCOPIC - Abnormal; Notable for the following components:  ?    Result Value  ? APPearance HAZY (*)   ? Glucose, UA >=500 (*)   ? Hgb urine dipstick SMALL (*)   ? Ketones, ur 20 (*)   ? Protein, ur >=300 (*)   ? Bacteria, UA MANY (*)   ? All other components within normal limits  ?COMPREHENSIVE METABOLIC PANEL - Abnormal; Notable for the following components:  ? Sodium 131 (*)   ? CO2 9 (*)   ? Glucose, Bld 732 (*)   ? BUN 58 (*)   ? Creatinine, Ser 3.64 (*)   ? Calcium 8.8 (*)   ? Albumin 3.0 (*)   ? GFR, Estimated 17 (*)   ? All other components within normal limits  ?BETA-HYDROXYBUTYRIC ACID - Abnormal; Notable for the following components:  ? Beta-Hydroxybutyric Acid 1.16 (*)   ? All other components within normal limits  ?CBC - Abnormal; Notable for the following components:  ? WBC 12.9 (*)   ? RBC 3.47 (*)   ? Hemoglobin 9.5 (*)   ? HCT 31.1 (*)   ? All  other components within normal limits  ?BLOOD GAS, VENOUS - Abnormal; Notable for the following components:  ? pH, Ven 7.2 (*)   ? pCO2, Ven 30 (*)   ? pO2, Ven 56 (*)   ? Bicarbonate 11.7 (*)   ? Acid-base deficit 15.0 (*)   ? All other components within normal limits  ?CBG MONITORING, ED - Abnormal; Notable for the following components:  ? Glucose-Capillary >600 (*)   ? All other components within normal limits  ?LIPASE, BLOOD  ?CBC  ?  BASIC METABOLIC PANEL  ?BASIC METABOLIC PANEL  ?BASIC METABOLIC PANEL  ?CBG MONITORING, ED  ?I-STAT BETA HCG BLOOD, ED ([facility], [facility], [facility] ONLY)  ?CBG MONITORING, ED  ? ? ?EKG ?EKG Interpretation ? ?Date/Time:  Wednesday July 26 2021 12:40:11 EDT ?Ventricular Rate:  109 ?PR Interval:  120 ?QRS Duration: 80 ?QT Interval:  334 ?QTC Calculation: 450 ?R Axis:   51 ?Text Interpretation: Sinus tachycardia Confirmed by Madalyn Rob [phone]) on 07/26/2021 1:36:20 PM ? ?Radiology ?No results found. ? ?Procedures ?[facility] KitchenCritical Care ?Performed by: Lucrezia Starch, MD ?Authorized by: Lucrezia Starch, MD  ? ?Critical care provider statement:  ?  Critical care time (minutes):  37 ?  Critical care was time spent personally by me on the following activities:  Development of treatment plan with patient or surrogate, discussions with consultants, evaluation of patient's response to treatment, examination of patient, ordering and review of laboratory studies, ordering and review of radiographic studies, ordering and performing treatments and interventions, pulse oximetry, re-evaluation of patient's condition and review of old charts  ? ? ?Medications Ordered in ED ?Medications  ?insulin regular, human (MYXREDLIN) 100 units/ 100 mL infusion (has no administration in time range)  ?lactated ringers infusion (has no administration in time range)  ?dextrose 5 % in lactated ringers infusion (has no administration in time range)  ?dextrose 50 % solution 0-50 mL (has no administration in time range)  ?potassium  chloride 10 mEq in 100 mL IVPB (has no administration in time range)  ?lactated ringers bolus 1,000 mL (1,000 mLs Intravenous Bolus 07/26/21 1317)  ? ? ?ED Course/ Medical Decision Making/ A&P ?  ?

## 2021-07-26 NOTE — ED Triage Notes (Signed)
Patient presents due to concerns of possibly going into DKA. She has been vomiting all morning and has been fatigued since yesterday. She has been unable to check her CBG at home.  ?

## 2021-07-26 NOTE — H&P (Signed)
?History and Physical  ? ? ?PatientAve [facility]arnhorst Freeman QHU:[id number] DOB: 03/09/1991 ?DOA: 07/26/2021 ?DOS: the patient was seen and examined on 07/26/2021 ?PCP: System, Provider Not In  ?Patient coming from: Home ? ?Chief Complaint:  ?Chief Complaint  ?Patient presents with  ? Emesis  ? Fatigue  ? Hyperglycemia  ? ?HPI: Ashley Freeman is a 31 y.o. female with medical history significant of DM1, HTN, CKD4. Presenting with N/V. It all started this morning. No other symptoms. She noted that her glucose is high. She reports compliance with her insulin regimen. She was concerned that she was going into DKA, so she came to the ED for help.She denies any other aggravating or alleviating factor.  ?  ?Review of Systems: As mentioned in the history of present illness. All other systems reviewed and are negative. ?Past Medical History:  ?Diagnosis Date  ? Abscess, gluteal, right 08/24/2013  ? AKI (acute kidney injury) ([place]) 07/26/2014  ? Anemia 02/19/2012  ? Bartholin's gland abscess 09/19/2013  ? BV (bacterial vaginosis) 11/24/2015  ? Diabetes mellitus type I ([place]) 2001  ? Diagnosed at age 50 ; Type I  ? Diarrhea 05/30/2016  ? DKA (diabetic ketoacidoses) 08/19/2013  ? Also in 2018  ? Gonorrhea 08/2011  ? Treated in 09/2011  ? History of trichomoniasis 05/31/2016  ? Hyperlipidemia 03/28/2016  ? Sepsis ([place]) 09/19/2013  ? ?Past Surgical History:  ?Procedure Laterality Date  ? CESAREAN SECTION N/A 10/05/2019  ? Procedure: CESAREAN SECTION;  Surgeon: Aletha Halim, MD;  Location: [facility];  Service: Obstetrics;  Laterality: N/A;  ? INCISION AND DRAINAGE ABSCESS Left 09/28/2019  ? Procedure: INCISION AND DRAINAGE VULVAR ABCESS;  Surgeon: [name], MD;  Location: [facility];  Service: Gynecology;  Laterality: Left;  ? INCISION AND DRAINAGE PERIRECTAL ABSCESS Right 08/18/2013  ? Procedure: IRRIGATION AND DEBRIDEMENT GLUTEAL ABSCESS;  Surgeon: Ralene Ok, MD;  Location: [facility];  Service: General;  Laterality:  Right;  ? INCISION AND DRAINAGE PERIRECTAL ABSCESS Right 09/19/2013  ? Procedure: IRRIGATION AND DEBRIDEMENT RIGHT GLUTEAL AND LABIAL ABSCESSES;  Surgeon: Ralene Ok, MD;  Location: [facility];  Service: General;  Laterality: Right;  ? INCISION AND DRAINAGE PERIRECTAL ABSCESS Right 09/24/2013  ? Procedure: IRRIGATION AND DEBRIDEMENT PERIRECTAL ABSCESS;  Surgeon: Gwenyth Ober, MD;  Location: [facility];  Service: General;  Laterality: Right;  ? ?Social History:  reports that she has never smoked. She has never used smokeless tobacco. She reports that she does not currently use alcohol. She reports that she does not use drugs. ? ?Allergies  ?Allergen Reactions  ? Cephalexin Anaphylaxis  ?  Has gotten ceftriaxone in the past   ? Penicillins Hives and Rash  ?  Has patient had a PCN reaction causing immediate rash, facial/tongue/throat swelling, SOB or lightheadedness with hypotension: Yes ?Has patient had a PCN reaction causing severe rash involving mucus membranes or skin necrosis: No ?Has patient had a PCN reaction that required hospitalization: Yes ?Has patient had a PCN reaction occurring within the last 10 years: No ?Spoke with pt - childhood hives told by mom, tried no pcns since, doesn't remember reaction herself   ? Benadryl [Diphenhydramine] Itching  ? Doxycycline Itching  ? ? ?Family History  ?Problem Relation Age of Onset  ? Asthma Mother   ? Carpal tunnel syndrome Mother   ? Gout Father   ? Diabetes Paternal Grandmother   ? Anesthesia problems Neg Hx   ? ? ?Prior to Admission medications   ?Medication Sig  Start Date End Date Taking? Authorizing Provider  ?amLODipine (NORVASC) 10 MG tablet Take 1 tablet (10 mg total) by mouth daily. 07/13/21 07/08/22  Loel Dubonnet, NP  ?blood glucose meter kit and supplies KIT Dispense based on patient and insurance preference. Use up to four times daily as directed. (FOR ICD-9 250.00, 250.01). 05/25/17   Isla Pence, MD  ?Blood Pressure Monitoring (BLOOD PRESSURE KIT) DEVI 1  Device by Does not apply route as needed. 05/06/19   Anyanwu, Sallyanne Havers, MD  ?carvedilol (COREG) 12.5 MG tablet Take 1 tablet (12.5 mg total) by mouth 2 (two) times daily. 07/14/21 10/12/21  Loel Dubonnet, NP  ?Continuous Blood Gluc Sensor (DEXCOM G6 SENSOR) MISC Inject 1 Device into the skin as directed. 12/16/20   Shamleffer, Melanie Crazier, MD  ?Continuous Blood Gluc Transmit (DEXCOM G6 TRANSMITTER) MISC Inject 1 Device into the skin as directed. Use to check blood sugar daily 07/13/21   Shamleffer, Melanie Crazier, MD  ?insulin aspart (NOVOLOG) 100 UNIT/ML injection Inject 0-6 Units into the skin 3 (three) times daily with meals. ?Patient taking differently: Inject 0-6 Units into the skin 3 (three) times daily with meals. Sliding scale 04/10/21   [place] Eric J, DO  ?Insulin Disposable Pump (OMNIPOD 5 G6 INTRO, GEN 5,) KIT 1 Device by Does not apply route every 3 (three) days. 05/24/21   Shamleffer, Melanie Crazier, MD  ?Insulin Disposable Pump (OMNIPOD 5 G6 POD, GEN 5,) MISC 1 Device by Does not apply route every 3 (three) days. 05/24/21   Shamleffer, Melanie Crazier, MD  ?Insulin Disposable Pump (OMNIPOD 5 G6 POD, GEN 5,) MISC 1 Device by Does not apply route every 3 (three) days. 07/25/21   Shamleffer, Melanie Crazier, MD  ?insulin glargine (LANTUS) 100 UNIT/ML injection Inject 0.1 mLs (10 Units total) into the skin daily. ?Patient not taking: Reported on 06/20/2021 04/14/21   Kayleen Memos, DO  ?Insulin Pen Needle 32G X 4 MM MISC Use as directed 10/07/20   Ghimire, Henreitta Leber, MD  ?lamoTRIgine (LAMICTAL) 150 MG tablet Take 150 mg by mouth daily. ?Patient not taking: Reported on 07/13/2021 03/29/21   [provider]  ? ? ?Physical Exam: ?Vitals:  ? 07/26/21 1235 07/26/21 1330 07/26/21 1338 07/26/21 1400  ?BP: (!) 147/106 (!) 172/104 (!) 164/104 (!) 178/111  ?Pulse: (!) 106 (!) 101 (!) 103 (!) 101  ?Resp: _0 ?Temp:      ?SpO2: 100% 100% 100% 100%  ? ?General: 31 y.o. female resting in bed in  NAD ?Eyes: PERRL, normal sclera ?ENMT: Nares patent w/o discharge, orophaynx clear, dentition normal, ears w/o discharge/lesions/ulcers ?Neck: Supple, trachea midline ?Cardiovascular: tachy, +S1, S2, no m/g/r, equal pulses throughout ?Respiratory: CTABL, no w/r/r, normal WOB ?GI: BS+, NDNT, no masses noted, no organomegaly noted ?MSK: No e/c/c ?Neuro: A&O x 3, no focal deficits ?Psyc: Appropriate interaction and affect, calm/cooperative ? ?Data Reviewed: ? ?pH  7.2 ?Na+  131 ?CO2  9 ?Glucose  732 ?Scr  3.64 ?WBC  12.9 ?Beta-hydroxybutyric acid  1.16 ? ?Assessment and Plan: ?No notes have been filed under this hospital service. ?Service: Hospitalist ?DKA ?DM1 uncontrolled ?Intractable N/V ?    - admit to inpt, SDU ?    - continue Endotool: insulin gtt, fluids ?    - check A1c ?    - NPO except for non-caloric fluids ?    - DM coordination team ? ?AKI on CKD4 ?    - fluids, check renal US, watch nephrotoxins ? ?  HTN ?    - will have PRN meds available, resume home regimen when off NPO status ? ?Advance Care Planning:   Code Status: FULL ? ?Consults: None ? ?Family Communication: None at bedside ? ?Severity of Illness: ?The appropriate patient status for this patient is INPATIENT. Inpatient status is judged to be reasonable and necessary in order to provide the required intensity of service to ensure the patient's safety. The patient's presenting symptoms, physical exam findings, and initial radiographic and laboratory data in the context of their chronic comorbidities is felt to place them at high risk for further clinical deterioration. Furthermore, it is not anticipated that the patient will be medically stable for discharge from the hospital within 2 midnights of admission.  ? ?* I certify that at the point of admission it is my clinical judgment that the patient will require inpatient hospital care spanning beyond 2 midnights from the point of admission due to high intensity of service, high risk for further  deterioration and high frequency of surveillance required.* ? ?Author: ?Jonnie Finner, DO ?07/26/2021 2:21 PM ? ?For on call review [email or web address].  ?

## 2021-07-27 DIAGNOSIS — N184 Chronic kidney disease, stage 4 (severe): Secondary | ICD-10-CM

## 2021-07-27 DIAGNOSIS — I10 Essential (primary) hypertension: Secondary | ICD-10-CM

## 2021-07-27 DIAGNOSIS — R112 Nausea with vomiting, unspecified: Secondary | ICD-10-CM

## 2021-07-27 DIAGNOSIS — N179 Acute kidney failure, unspecified: Secondary | ICD-10-CM

## 2021-07-27 DIAGNOSIS — E1022 Type 1 diabetes mellitus with diabetic chronic kidney disease: Secondary | ICD-10-CM

## 2021-07-27 DIAGNOSIS — E1065 Type 1 diabetes mellitus with hyperglycemia: Secondary | ICD-10-CM

## 2021-07-27 LAB — BASIC METABOLIC PANEL
Anion gap: 7 (ref 5–15)
Anion gap: 7 (ref 5–15)
Anion gap: 7 (ref 5–15)
BUN: 50 mg/dL — ABNORMAL HIGH (ref 6–20)
BUN: 49 mg/dL — ABNORMAL HIGH (ref 6–20)
BUN: 47 mg/dL — ABNORMAL HIGH (ref 6–20)
CO2: 13 mmol/L — ABNORMAL LOW (ref 22–32)
CO2: 12 mmol/L — ABNORMAL LOW (ref 22–32)
CO2: 13 mmol/L — ABNORMAL LOW (ref 22–32)
Calcium: 8.7 mg/dL — ABNORMAL LOW (ref 8.9–10.3)
Calcium: 8.4 mg/dL — ABNORMAL LOW (ref 8.9–10.3)
Calcium: 8.4 mg/dL — ABNORMAL LOW (ref 8.9–10.3)
Chloride: 116 mmol/L — ABNORMAL HIGH (ref 98–111)
Chloride: 115 mmol/L — ABNORMAL HIGH (ref 98–111)
Chloride: 113 mmol/L — ABNORMAL HIGH (ref 98–111)
Creatinine, Ser: 3.17 mg/dL — ABNORMAL HIGH (ref 0.44–1.00)
Creatinine, Ser: 3.13 mg/dL — ABNORMAL HIGH (ref 0.44–1.00)
Creatinine, Ser: 2.89 mg/dL — ABNORMAL HIGH (ref 0.44–1.00)
GFR, Estimated: 20 mL/min — ABNORMAL LOW (ref >60)
GFR, Estimated: 20 mL/min — ABNORMAL LOW (ref >60)
GFR, Estimated: 22 mL/min — ABNORMAL LOW (ref >60)
Glucose, Bld: 133 mg/dL — ABNORMAL HIGH (ref 70–99)
Glucose, Bld: 164 mg/dL — ABNORMAL HIGH (ref 70–99)
Glucose, Bld: 191 mg/dL — ABNORMAL HIGH (ref 70–99)
Potassium: 3.5 mmol/L (ref 3.5–5.1)
Potassium: 3.5 mmol/L (ref 3.5–5.1)
Potassium: 3.9 mmol/L (ref 3.5–5.1)
Sodium: 136 mmol/L (ref 135–145)
Sodium: 134 mmol/L — ABNORMAL LOW (ref 135–145)
Sodium: 133 mmol/L — ABNORMAL LOW (ref 135–145)

## 2021-07-27 LAB — GLUCOSE, CAPILLARY
Glucose-Capillary: 115 mg/dL — ABNORMAL HIGH (ref 70–99)
Glucose-Capillary: 117 mg/dL — ABNORMAL HIGH (ref 70–99)
Glucose-Capillary: 119 mg/dL — ABNORMAL HIGH (ref 70–99)
Glucose-Capillary: 123 mg/dL — ABNORMAL HIGH (ref 70–99)
Glucose-Capillary: 130 mg/dL — ABNORMAL HIGH (ref 70–99)
Glucose-Capillary: 134 mg/dL — ABNORMAL HIGH (ref 70–99)
Glucose-Capillary: 149 mg/dL — ABNORMAL HIGH (ref 70–99)
Glucose-Capillary: 156 mg/dL — ABNORMAL HIGH (ref 70–99)
Glucose-Capillary: 160 mg/dL — ABNORMAL HIGH (ref 70–99)
Glucose-Capillary: 178 mg/dL — ABNORMAL HIGH (ref 70–99)
Glucose-Capillary: 215 mg/dL — ABNORMAL HIGH (ref 70–99)
Glucose-Capillary: 257 mg/dL — ABNORMAL HIGH (ref 70–99)
Glucose-Capillary: 264 mg/dL — ABNORMAL HIGH (ref 70–99)
Glucose-Capillary: 279 mg/dL — ABNORMAL HIGH (ref 70–99)
Glucose-Capillary: 285 mg/dL — ABNORMAL HIGH (ref 70–99)
Glucose-Capillary: 327 mg/dL — ABNORMAL HIGH (ref 70–99)

## 2021-07-27 LAB — BETA-HYDROXYBUTYRIC ACID
Beta-Hydroxybutyric Acid: 0.41 mmol/L — ABNORMAL HIGH (ref 0.05–0.27)
Beta-Hydroxybutyric Acid: 1.1 mmol/L — ABNORMAL HIGH (ref 0.05–0.27)

## 2021-07-27 MED ORDER — INSULIN ASPART 100 UNIT/ML IJ SOLN: 0.0000 [IU] | Freq: Every day | INTRAMUSCULAR | Status: DC

## 2021-07-27 MED ORDER — CHLORHEXIDINE GLUCONATE 0.12 % MT SOLN
15.0000 mL | Freq: Two times a day (BID) | OROMUCOSAL | Status: DC
  Administered 2021-07-27: 15 mL via OROMUCOSAL
  Filled 2021-07-27: qty 15

## 2021-07-27 MED ORDER — AMLODIPINE BESYLATE 10 MG PO TABS: 10.0000 mg | ORAL_TABLET | Freq: Every day | ORAL | Status: DC

## 2021-07-27 MED ORDER — SODIUM BICARBONATE 650 MG PO TABS
650.0000 mg | ORAL_TABLET | Freq: Three times a day (TID) | ORAL | Status: DC
  Administered 2021-07-27 (×2): 650 mg via ORAL
  Filled 2021-07-27 (×2): qty 1

## 2021-07-27 MED ORDER — ORAL CARE MOUTH RINSE
15.0000 mL | Freq: Two times a day (BID) | OROMUCOSAL | Status: DC
  Administered 2021-07-27 (×2): 15 mL via OROMUCOSAL

## 2021-07-27 MED ORDER — SODIUM BICARBONATE 650 MG PO TABS
1300.0000 mg | ORAL_TABLET | Freq: Two times a day (BID) | ORAL | 0 refills | Status: AC
Start: 2021-07-27 — End: 2021-08-26

## 2021-07-27 MED ORDER — LURASIDONE HCL 40 MG PO TABS: 40.0000 mg | ORAL_TABLET | Freq: Every day | ORAL | Status: DC

## 2021-07-27 MED ORDER — INSULIN GLARGINE-YFGN 100 UNIT/ML ~~LOC~~ SOLN
10.0000 [IU] | Freq: Every day | SUBCUTANEOUS | Status: DC
  Administered 2021-07-27: 10 [IU] via SUBCUTANEOUS
  Filled 2021-07-27: qty 0.1

## 2021-07-27 MED ORDER — CARVEDILOL 12.5 MG PO TABS
12.5000 mg | ORAL_TABLET | Freq: Two times a day (BID) | ORAL | Status: DC
  Administered 2021-07-27: 12.5 mg via ORAL
  Filled 2021-07-27: qty 1

## 2021-07-27 MED ORDER — INSULIN ASPART 100 UNIT/ML IJ SOLN
0.0000 [IU] | Freq: Three times a day (TID) | INTRAMUSCULAR | Status: DC
  Administered 2021-07-27: 5 [IU] via SUBCUTANEOUS

## 2021-07-27 NOTE — Discharge Summary (Signed)
?Physician Discharge Summary ?  ?Patient: Ashley Freeman MRN: [id number] DOB: [birth year]  ?Admit date:     07/26/2021  ?Discharge date: 07/27/21  ?Discharge Physician: Rebekah Chesterfield Azazel Franze  ? ?PCP: System, Provider Not In  ? ?Recommendations at discharge:  ? ?Follow-up with your primary care provider in 1 week.  Check blood work at that time. ?Patient would need to follow-up with nephrology as soon as possible for CKD stage IV with metabolic acidosis. ? ?Discharge Diagnoses: ?Active Problems: ?  Type 1 diabetes mellitus with hyperglycemia (HCC) ?  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  N&V (nausea and vomiting) ?  AKI (acute kidney injury) (HCC) ?  DKA (diabetic ketoacidosis) (HCC) ?  HTN (hypertension), benign ? ?Resolved Problems: ?  * No resolved hospital problems. * ? ?Hospital Course: ?Rylan Climer [place] is a 31 y.o. female with past medical history significant of DM1, HTN, CKD4 to hospital with nausea vomiting.  Patient was concerned about DKA so she decided to come to the hospital.  Denied any fever.  No urinary or pulmonary symptoms on presentation.  Initially, patient had blood glucose levels more than 732 with anion gap of 12.  Patient's initial creatinine was 3.6.  CO2 was low at 9.  Patient was initiated on insulin drip and was admitted to hospital for further evaluation and treatment ? ?Assessment and Plan: ? ?Diabetes mellitus type 1 uncontrolled with mild DKA.  Patient has baseline low bicarbonate from stage IV chronic kidney disease.  Patient has been on insulin drip since yesterday and clinically feels well.  Symptoms have improved with IV fluids and insulin drip.  At this time patient feels hungry.  Anion gap have been consistently negative for several BMPs.  Patient will resume her insulin regimen from home on discharge. ?  ?AKI on CKD4 with  non-anion gap metabolic acidosis. ? Possibility of renal tubular acidosis.  Initial creatinine at 3.6.  Received IV fluids and  insulin since yesterday.  Creatinine today at 3.1.  Will need to follow-up with nephrology as outpatient.  Spoke with Dr. Glenna Fellows nephrology on-call who suggested sodium bicarbonate 1300 mg p.o. 3 times daily on discharge with nephrology follow-up in 3 to 4 weeks.  She stated that she would set up an appointment for follow-up. ?  ?Essential hypertension  ?We will resume amlodipine and Coreg from home.  We will continue to monitor. ? ?Consultants: None ? ?Procedures performed: None ? ?Disposition: Home ? ?Diet recommendation:  ?Discharge Diet Orders (From admission, onward)  ? ?  Start     Ordered  ? 07/27/21 0000  Diet - low sodium heart healthy       ? 07/27/21 1509  ? ?  ?  ? ?  ? ?Carb modified diet ?DISCHARGE MEDICATION: ?Allergies as of 07/27/2021   ? ?   Reactions  ? Cephalexin Anaphylaxis  ? Has gotten ceftriaxone in the past   ? Penicillins Hives, Rash  ? Has patient had a PCN reaction causing immediate rash, facial/tongue/throat swelling, SOB or lightheadedness with hypotension: Yes ?Has patient had a PCN reaction causing severe rash involving mucus membranes or skin necrosis: No ?Has patient had a PCN reaction that required hospitalization: Yes ?Has patient had a PCN reaction occurring within the last 10 years: No ?Spoke with pt - childhood hives told by mom, tried no pcns since, doesn't remember reaction herself   ? Benadryl [diphenhydramine] Itching  ? Doxycycline Itching  ? ?  ? ?  ?Medication  List  ?  ? ?TAKE these medications   ? ?amLODipine 10 MG tablet ?Commonly known as: NORVASC ?Take 1 tablet (10 mg total) by mouth daily. ?  ?blood glucose meter kit and supplies Kit ?Dispense based on patient and insurance preference. Use up to four times daily as directed. (FOR ICD-9 250.00, 250.01). ?  ?Blood Pressure Kit Devi ?1 Device by Does not apply route as needed. ?  ?carvedilol 12.5 MG tablet ?Commonly known as: COREG ?Take 1 tablet (12.5 mg total) by mouth 2 (two) times daily. ?  ?Dexcom G6 Sensor  Misc ?Inject 1 Device into the skin as directed. ?  ?Dexcom G6 Transmitter Misc ?Inject 1 Device into the skin as directed. Use to check blood sugar daily ?  ?insulin aspart 100 UNIT/ML injection ?Commonly known as: novoLOG ?Inject 0-6 Units into the skin 3 (three) times daily with meals. ?What changed:  ?when to take this ?additional instructions ?  ?Lantus 100 UNIT/ML injection ?Generic drug: insulin glargine ?Inject 0.1 mLs (10 Units total) into the skin daily. ?  ?lurasidone 40 MG Tabs tablet ?Commonly known as: LATUDA ?Take 40 mg by mouth daily with breakfast. ?  ?Omnipod 5 G6 Intro (Gen 5) Kit ?1 Device by Does not apply route every 3 (three) days. ?  ?Omnipod 5 G6 Pod (Gen 5) Misc ?1 Device by Does not apply route every 3 (three) days. ?  ?Omnipod 5 G6 Pod (Gen 5) Misc ?1 Device by Does not apply route every 3 (three) days. ?  ?Pentips 32G X 4 MM Misc ?Generic drug: Insulin Pen Needle ?Use as directed ?  ?sodium bicarbonate 650 MG tablet ?Take 2 tablets (1,300 mg total) by mouth 2 (two) times daily. ?  ? ?  ? ? Follow-up Information   ? ? Marykay Lex, MD Follow up in 1 week(s).   ?Specialty: Cardiology ?Why: check blood work, discuss about referral to nephrology ?Contact information: ?[address] ?[address] ?[place] [address] ?[phone] ? ? ?  ?  ? ? Tyler Pita, MD. Schedule an appointment as soon as possible for a visit in 4 week(s).   ?Specialty: Internal Medicine ?Contact information: ?[address] ?[place] [address] ?[phone] ? ? ?  ?  ? ?  ?  ? ?  ? ?Subjective ?Today, patient was seen and examined at bedside.  Denies any nausea vomiting abdominal pain diarrhea no fever chills or rigor. ? ?Discharge Exam: ?Filed Weights  ? 07/26/21 1616  ?Weight: 53.5 kg  ? ? ?  07/27/2021  ?  3:00 PM 07/27/2021  ?  2:00 PM 07/27/2021  ?  1:46 PM  ?Vitals with BMI  ?Systolic 157 151 132  ?Diastolic 80 62 57  ?Pulse 93 98 102  ?  ?Body mass index is 20.89 kg/m?.  ? ?General: Thinly built, not in  obvious distress ?HENT:   No scleral pallor or icterus noted. Oral mucosa is moist.  ?Chest:  Clear breath sounds.  Diminished breath sounds bilaterally. No crackles or wheezes.  ?CVS: S1 &S2 heard. No murmur.  Regular rate and rhythm. ?Abdomen: Soft, nontender, nondistended.  Bowel sounds are heard.   ?Extremities: No cyanosis, clubbing or edema.  Peripheral pulses are palpable. ?Psych: Alert, awake and oriented, normal mood ?CNS:  No cranial nerve deficits.  Power equal in all extremities.   ?Skin: Warm and dry.  No rashes noted. ? ? ?Condition at discharge: good ? ?The results of significant diagnostics from this hospitalization (including imaging, microbiology, ancillary  and laboratory) are listed below for reference.  ? ?Imaging Studies: ?US RENAL ? ?Result Date: 07/26/2021 ?CLINICAL DATA:  Acute kidney injury. EXAM: RENAL / URINARY TRACT ULTRASOUND COMPLETE COMPARISON:  02/04/2018 FINDINGS: Right Kidney: Renal measurements: 11.0 x 5.1 x 5.4 cm = volume: 159 mL. Slightly increased echogenicity in the right kidney. No hydronephrosis. No suspicious renal lesion. Left Kidney: Renal measurements: 10.4 x 6.9 x 5.3 cm = volume: 198 mL. Slightly increased echogenicity. No hydronephrosis. No suspicious renal lesion. Bladder: Normal appearance of the urinary bladder.  Bilateral ureter jets. Other: Gallbladder is mildly distended with echogenic material. This could represent sludge. No gallbladder wall thickening. IMPRESSION: 1. Slightly increased echogenicity in both kidneys. Findings are nonspecific but could be related to underlying medical renal disease. 2. Negative for hydronephrosis. 3. Gallbladder is distended with echogenic material that could represent sludge. Electronically Signed   By: Richarda Overlie M.D.   On: 07/26/2021 16:32   ? ?Microbiology: ?Results for orders placed or performed during the hospital encounter of 07/26/21  ?MRSA Next Gen by PCR, Nasal     Status: None  ? Collection Time: 07/26/21  3:48 PM  ?  Specimen: Nasal Mucosa; Nasal Swab  ?Result Value Ref Range Status  ? MRSA by PCR Next Gen NOT DETECTED NOT DETECTED Final  ?  Comment: (NOTE) ?The GeneXpert MRSA Assay (FDA approved for NASAL specimens only),

## 2021-07-27 NOTE — Hospital Course (Addendum)
Ashley Freeman is a 31 y.o. female with past medical history significant of DM1, HTN, CKD4 to hospital with nausea vomiting.  Patient was concerned about DKA so she decided to come to the hospital.  Denied any fever.  No urinary or pulmonary symptoms on presentation.  Initially, patient had blood glucose levels more than 732 with anion gap of 12.  Patient's initial creatinine was 3.6.  CO2 was low at 9.  Patient was initiated on insulin drip and was admitted to hospital for further evaluation and treatment ? ?Assessment and Plan: ? ?Diabetes mellitus type 1 uncontrolled with mild DKA.  Patient has baseline low bicarbonate from stage IV chronic kidney disease.  Patient has been on insulin drip since yesterday and clinically feels well.  Symptoms have improved with IV fluids and insulin drip.  At this time patient feels hungry.  Anion gap have been consistently negative for several BMPs.  Patient will resume her insulin regimen from home on discharge. ?  ?AKI on CKD4 with  non-anion gap metabolic acidosis. ? Possibility of renal tubular acidosis.  Initial creatinine at 3.6.  Received IV fluids and insulin since yesterday.  Creatinine today at 3.1.  Will need to follow-up with nephrology as outpatient.  Spoke with Dr. Johnney Ou nephrology on-call who suggested sodium bicarbonate 1300 mg p.o. 3 times daily on discharge with nephrology follow-up in 3 to 4 weeks.  She stated that she would set up an appointment for follow-up. ?  ?Essential hypertension  ?We will resume amlodipine and Coreg from home.  We will continue to monitor. ?

## 2021-07-27 NOTE — Plan of Care (Signed)

## 2021-07-27 NOTE — Progress Notes (Signed)
Inpatient Diabetes Program Recommendations ? ?AACE/ADA: New Consensus Statement on Inpatient Glycemic Control (2015) ? ?Target Ranges:  Prepandial:   less than 140 mg/dL ?     Peak postprandial:   less than 180 mg/dL (1-2 hours) ?     Critically ill patients:  140 - 180 mg/dL  ? ?Lab Results  ?Component Value Date  ? GLUCAP 215 (H) 07/27/2021  ? HGBA1C 8.0 (H) 07/26/2021  ? ? ?Review of Glycemic Control ? ?Diabetes history: DM type 1 ?Outpatient Diabetes medications: omnipod insulin pump uses Novolog, Dexcom G6 CGM ?Current orders for Inpatient glycemic control:  ?Semglee 10 units Daily ? ?Inpatient Diabetes Program Recommendations:   ? ?-  Add Novolog 0-9 units tid + hs ?-  Add Novolog 3 units tid meal coverage if eating >50% of meals ? ?Spoke with pt at bedside regarding DKA. Pt reports insulin pump is working fine and she even tried to give herself an extra SQ injection of Novolog when her pump was not brining down her glucose. Pt reports that she has been "stressed out" and that is why she had her glucose spike. Pt has close follow up with Dr. Kelton Pillar, Endocrinologist outpatient. Pt reports having all needed supplies at home. No needs identified at this time. ? ?Thanks, ? ?Tama Headings RN, MSN, BC-ADM ?Inpatient Diabetes Coordinator ?Team Pager [phone] (8a-5p) ? ?

## 2021-07-27 NOTE — Progress Notes (Signed)
All belongs returned to patient for discharge. Pt has her insulin pump but does not have her insulin with her to apply the pump at this moment. MD aware. ?

## 2021-07-28 ENCOUNTER — Telehealth (HOSPITAL_COMMUNITY): Payer: Self-pay | Admitting: Licensed Clinical Social Worker

## 2021-07-28 NOTE — Progress Notes (Signed)
?  Heart and Vascular Care Navigation ? ?07/28/2021 ? ?Ashley Freeman ?April 28, 1991 ?[id number] ? ?Reason for Referral: Pt called CSW to request assistance with housing costs ?  ?Engaged with patient by telephone for follow up visit for Heart and Vascular Care Coordination. ?                                                                                                  ?Assessment:   Pt moving into new apartment on April 17th and does not have full amount for security deposit and first months rent.  Pt reports she has all but about $360 of what is needed for move in costs.  Discussed referral to Patient Care fund to assist with these costs.  Pt bringing in supporting documents so we can proceed.                               ? ?HRT/VAS Care Coordination   ? ? Living arrangements for the past 2 months Single Family Home  ? Lives with: Parents  ? Home Assistive Devices/Equipment None  ? DME Agency NA  ? [place] Agency NA  ? ?  ? ? ?Social History:                                                                             ?SDOH Screenings  ? ?Alcohol Screen: Not on file  ?Depression (PHQ2-9): Not on file  ?Financial Resource Strain: High Risk  ? Difficulty of Paying Living Expenses: Hard  ?Food Insecurity: No Food Insecurity  ? Worried About Charity fundraiser in the Last Year: Never true  ? Ran Out of Food in the Last Year: Never true  ?Housing: Low Risk   ? Last Housing Risk Score: 0  ?Physical Activity: Not on file  ?Social Connections: Not on file  ?Stress: Not on file  ?Tobacco Use: Low Risk   ? Smoking Tobacco Use: Never  ? Smokeless Tobacco Use: Never  ? Passive Exposure: Not on file  ?Transportation Needs: No Transportation Needs  ? Lack of Transportation (Medical): No  ? Lack of Transportation (Non-Medical): No  ? ? ?SDOH Interventions: ?Financial Resources:    ?Patient care fund referral  ?Food Insecurity:   None reported  ?Housing Insecurity:  Pt moving into new apartment  ?Transportation:   None  reported  ? ? ?Follow-up plan:   ? ? ?Will send in check request once pt apartment complex sends CSW invoice and w-9 ? ?Jorge Ny, LCSW ?Clinical Social Worker ?[facility] ?Desk#: [phone] ?Cell#: [phone] ? ? ?

## 2021-08-14 ENCOUNTER — Encounter (HOSPITAL_BASED_OUTPATIENT_CLINIC_OR_DEPARTMENT_OTHER): Payer: Self-pay | Admitting: Family

## 2021-08-14 ENCOUNTER — Other Ambulatory Visit: Payer: Self-pay

## 2021-08-14 ENCOUNTER — Ambulatory Visit (INDEPENDENT_AMBULATORY_CARE_PROVIDER_SITE_OTHER): Payer: Medicaid Other | Admitting: Family

## 2021-08-14 VITALS — BP 156/94 | HR 95 | Ht 63.0 in | Wt 124.8 lb

## 2021-08-14 DIAGNOSIS — I10 Essential (primary) hypertension: Secondary | ICD-10-CM

## 2021-08-14 DIAGNOSIS — N1832 Chronic kidney disease, stage 3b: Secondary | ICD-10-CM | POA: Insufficient documentation

## 2021-08-14 DIAGNOSIS — E108 Type 1 diabetes mellitus with unspecified complications: Secondary | ICD-10-CM

## 2021-08-14 MED ORDER — CARVEDILOL 25 MG PO TABS: 25.0000 mg | ORAL_TABLET | Freq: Two times a day (BID) | ORAL | 5 refills | Status: DC

## 2021-08-14 MED ORDER — INSULIN ASPART 100 UNIT/ML IJ SOLN: 0.0000 [IU] | INTRAMUSCULAR | 3 refills | Status: DC

## 2021-08-14 NOTE — Progress Notes (Signed)
? ?Office Visit  ?  ?Patient Name: Ashley Freeman ?Date of Encounter: 08/14/2021 ? ?PCP:  System, Provider Not In ?  ?[place]  ?Cardiologist:  Glenetta Hew, MD  ?Advanced Practice Provider:  No care team member to display ?Electrophysiologist:  None  ? ?Chief Complaint  ?  ?Ashley Freeman is a 31 y.o. female with a hx of type 1 diabetes (diagnosed at age 79) with recurrent DKA, hyperlipidemia, anemia, AKI, CKD 3b,hypertension presents today for follow-up after hospitalization for DKA ? ?Past Medical History  ?  ?Past Medical History:  ?Diagnosis Date  ? Abscess, gluteal, right 08/24/2013  ? AKI (acute kidney injury) ([place]) 07/26/2014  ? Anemia 02/19/2012  ? Bartholin's gland abscess 09/19/2013  ? BV (bacterial vaginosis) 11/24/2015  ? Diabetes mellitus type I ([place]) 2001  ? Diagnosed at age 67 ; Type I  ? Diarrhea 05/30/2016  ? DKA (diabetic ketoacidoses) 08/19/2013  ? Also in 2018  ? Gonorrhea 08/2011  ? Treated in 09/2011  ? History of trichomoniasis 05/31/2016  ? Hyperlipidemia 03/28/2016  ? Sepsis ([place]) 09/19/2013  ? ?Past Surgical History:  ?Procedure Laterality Date  ? CESAREAN SECTION N/A 10/05/2019  ? Procedure: CESAREAN SECTION;  Surgeon: Aletha Halim, MD;  Location: [facility];  Service: Obstetrics;  Laterality: N/A;  ? INCISION AND DRAINAGE ABSCESS Left 09/28/2019  ? Procedure: INCISION AND DRAINAGE VULVAR ABCESS;  Surgeon: [name], MD;  Location: [place];  Service: Gynecology;  Laterality: Left;  ? INCISION AND DRAINAGE PERIRECTAL ABSCESS Right 08/18/2013  ? Procedure: IRRIGATION AND DEBRIDEMENT GLUTEAL ABSCESS;  Surgeon: Ralene Ok, MD;  Location: [place];  Service: General;  Laterality: Right;  ? INCISION AND DRAINAGE PERIRECTAL ABSCESS Right 09/19/2013  ? Procedure: IRRIGATION AND DEBRIDEMENT RIGHT GLUTEAL AND LABIAL ABSCESSES;  Surgeon: Ralene Ok, MD;  Location: [place];  Service: General;  Laterality: Right;  ? INCISION AND DRAINAGE PERIRECTAL  ABSCESS Right 09/24/2013  ? Procedure: IRRIGATION AND DEBRIDEMENT PERIRECTAL ABSCESS;  Surgeon: Gwenyth Ober, MD;  Location: [place];  Service: General;  Laterality: Right;  ? ?Allergies ? ?Allergies  ?Allergen Reactions  ? Cephalexin Anaphylaxis  ?  Has gotten ceftriaxone in the past   ? Penicillins Hives and Rash  ?  Has patient had a PCN reaction causing immediate rash, facial/tongue/throat swelling, SOB or lightheadedness with hypotension: Yes ?Has patient had a PCN reaction causing severe rash involving mucus membranes or skin necrosis: No ?Has patient had a PCN reaction that required hospitalization: Yes ?Has patient had a PCN reaction occurring within the last 10 years: No ?Spoke with pt - childhood hives told by mom, tried no pcns since, doesn't remember reaction herself   ? Benadryl [Diphenhydramine] Itching  ? Doxycycline Itching  ? ? ?History of Present Illness  ?  ?Ashley Freeman is a 31 y.o. female with a hx of  type 1 diabetes (diagnosed at age 43) with recurrent DKA, hyperlipidemia, anemia, AKI, CKD 3b,hypertension  last seen 07/13/2021 ? ?Was referred to cardiology during pregnancy due to elevated blood pressure and evaluated by Dr. Ellyn Hack 08/03/2019.  Her blood pressure in clinic was 138/90 though well controlled when seen by other providers.  She was recommended to monitor blood pressure at home and report if consistently greater than 130/80.  Echocardiogram ordered revealing normal LVEF 60 to 65%, no RWMA, no significant valvular abnormalities.  ?   ?She has had recurrent admissions with diabetic ketoacidosis including admission May 2022, June 2022, August  2022, November 2022 x 2 (including admission for COVID-10), December 2022 x 3.  ? ?She was admitted 04/08/2021 with DKA and cardiology consulted for evaluation of chest pain which was suspected to be demand ischemia in the setting of DKA.  Echo 04/01/2021 LVEF 65 to 70%, no RWMA, mildly elevated PASP, trivial MR.  Repeat echo 04/08/2021  EF 55 to 60%, no R WMA, small pericardial effusion with no evidence of tamponade, trivial MR. ? ?Admitted to [facility] 04/16/21 for anasarca. Albumin 1.8 and provided IV albumin and Lasix. UPC consistent with nephrotic range proteinuria. Nephrology started Lisinopril and discharged on Lasix. Echo during that admission with LVEF 40% and Amlodipine transitioned to Carvedilol. ? ?ED visit 04/24/21 with hypoglycemia associated with seizures. ED 06/20/21 with acute gastroenteritis and dehydration. She had AKI on CKD 3b with creatinine 3.43 (baseline 2-2.3) which improved with IV fluids. She left AMA. ? ?She was seen 07/13/2021 with significantly elevated blood pressure.  Amlodipine 10 mg resumed.  Lisinopril 40 mg refill provided.  Carvedilol 12.5 mg twice daily continued. ? ?Since last seen she was admitted 07/26/2021 with recurrent DKA. ? ?She presents today for follow-up.  She has a 28-year-old daughter.  She tells me she has been taking her carvedilol and amlodipine but missed today as she was busy unpacking after moving.  Checked her blood pressure last week at [facility] with systolic in the 867Y.  She has not yet located her blood pressure cuff and her boxes after moving. Reports no shortness of breath nor dyspnea on exertion.  Shares with me that her energy level is improving.  Reports no chest pain, pressure, or tightness. No orthopnea, PND.  Notes occasional lower extremity edema predominantly in her feet that resolves by the morning.  Reports no palpitations.   ? ?EKGs/Labs/Other Studies Reviewed:  ? ?The following studies were reviewed today: ?ECHO: 04/08/2021 ? 1. Left ventricular ejection fraction, by estimation, is 55 to 60%. The  ?left ventricle has normal function. The left ventricle has no regional  ?wall motion abnormalities. Left ventricular diastolic parameters were  ?normal.  ? 2. Right ventricular systolic function is normal. The right ventricular  ?size is normal. Tricuspid regurgitation signal is inadequate  for assessing PA pressure.  ? 3. A small pericardial effusion is present. The pericardial effusion is  ?circumferential. There is no evidence of cardiac tamponade. (New compared to 04/01/2021 echo) ? 4. The mitral valve is grossly normal. Trivial mitral valve  ?regurgitation. No evidence of mitral stenosis.  ? 5. The aortic valve is tricuspid. Aortic valve regurgitation is not  ?visualized. No aortic stenosis is present.  ? 6. The inferior vena cava is normal in size with greater than 50%  ?respiratory variability, suggesting right atrial pressure of 3 mmHg.  ? ?Echo 04/18/21 ?Summary  ? 1. The left ventricle is upper normal in size with normal wall thickness.  ?  2. The left ventricular systolic function is moderately decreased, LVEF is  ?visually estimated at 40%.  ?  3. The right ventricle is normal in size, with normal systolic function.  ?  4. Limited study to assess ventricular function.  ? ?EKG:  No EKG today.  ? ?Recent Labs: ?03/31/2021: TSH 3.642 ?04/13/2021: B Natriuretic Peptide 963.0 ?06/20/2021: Magnesium 2.0 ?07/26/2021: ALT 25; Hemoglobin 9.5; Platelets 359 ?07/27/2021: BUN 47; Creatinine, Ser 2.89; Potassium 3.9; Sodium 133  ?Recent Lipid Panel ?   ?Component Value Date/Time  ? CHOL 235 (H) 05/24/2021 1552  ? TRIG 157.0 (H) 05/24/2021 1552  ?  HDL 54.70 05/24/2021 1552  ? CHOLHDL 4 05/24/2021 1552  ? VLDL 31.4 05/24/2021 1552  ? LDLCALC 149 (H) 05/24/2021 1552  ? ? ?Home Medications  ? ?Current Meds  ?Medication Sig  ? amLODipine (NORVASC) 10 MG tablet Take 1 tablet (10 mg total) by mouth daily.  ? blood glucose meter kit and supplies KIT Dispense based on patient and insurance preference. Use up to four times daily as directed. (FOR ICD-9 250.00, 250.01).  ? Blood Pressure Monitoring (BLOOD PRESSURE KIT) DEVI 1 Device by Does not apply route as needed.  ? carvedilol (COREG) 12.5 MG tablet Take 1 tablet (12.5 mg total) by mouth 2 (two) times daily.  ? Continuous Blood Gluc Sensor (DEXCOM G6 SENSOR) MISC  Inject 1 Device into the skin as directed.  ? Continuous Blood Gluc Transmit (DEXCOM G6 TRANSMITTER) MISC Inject 1 Device into the skin as directed. Use to check blood sugar daily  ? insulin aspart (N

## 2021-08-14 NOTE — Patient Instructions (Signed)
Medication Instructions:  ?Your physician has recommended you make the following change in your medication:  ? ?Change: Carvedilol 25mg  twice daily ?  ?*If you need a refill on your cardiac medications before your next appointment, please call your pharmacy* ? ? ?Lab Work: ?None ordered today ? ?Testing/Procedures: ?None ordered today  ? ? ?Follow-Up: ?At [facility], you and your health needs are our priority.  As part of our continuing mission to provide you with exceptional heart care, we have created designated Provider Care Teams.  These Care Teams include your primary Cardiologist (physician) and Advanced Practice Providers (APPs -  Physician Assistants and Nurse Practitioners) who all work together to provide you with the care you need, when you need it. ? ?We recommend signing up for the patient portal called "MyChart".  Sign up information is provided on this After Visit Summary.  MyChart is used to connect with patients for Virtual Visits (Telemedicine).  Patients are able to view lab/test results, encounter notes, upcoming appointments, etc.  Non-urgent messages can be sent to your provider as well.   ?To learn more about what you can do with MyChart, go to [email or web address].   ? ?Your next appointment:   ?2 month(s) ? ?The format for your next appointment:   ?In Person ? ?Provider:   ?Glenetta Hew, MD  or Laurann Montana, NP { ? ?Other Instructions ?Exercise recommendations: ?The American Heart Association recommends 150 minutes of moderate intensity exercise weekly. ?Try 30 minutes of moderate intensity exercise 4-5 times per week. ?This could include walking, jogging, or swimming. ? ?Heart Healthy Diet Recommendations: ?A low-salt diet is recommended. Meats should be grilled, baked, or boiled. Avoid fried foods. Focus on lean protein sources like fish or chicken with vegetables and fruits. The American Heart Association is a Microbiologist!  American Heart Association Diet and Lifeystyle  Recommendations  ? ?To prevent or reduce lower extremity swelling: ?Eat a low salt diet. Salt makes the body hold onto extra fluid which causes swelling. ?Sit with legs elevated. For example, in the recliner or on an [place].  ?Wear knee-high compression stockings during the daytime. Ones labeled 15-20 mmHg provide good compression. ? ? ?Important Information About Sugar ? ? ? ? ? ? ?

## 2021-08-15 ENCOUNTER — Other Ambulatory Visit (HOSPITAL_BASED_OUTPATIENT_CLINIC_OR_DEPARTMENT_OTHER): Payer: Self-pay | Admitting: *Deleted

## 2021-08-15 DIAGNOSIS — N1832 Chronic kidney disease, stage 3b: Secondary | ICD-10-CM

## 2021-08-15 NOTE — Progress Notes (Deleted)
   Ashley Freeman 1991/03/11 [id number]   History:  31 y.o. G1P1 presents for annual exam. Desires STI screen, due for pap.   Gynecologic History No LMP recorded. (Menstrual status: Irregular Periods).   Contraception/Family planning: {method:5051} Sexually active: yes Last Pap: 2020. Results were: normal   Obstetric History OB History  Gravida Para Term Preterm AB Living  2 1   1 1 1   SAB IAB Ectopic Multiple Live Births  1     0 1    # Outcome Date GA Lbr Len/2nd Weight Sex Delivery Anes PTL Lv  2 Preterm 10/05/19 [redacted]w[redacted]d  3 lb 4.9 oz (1.5 kg) F CS-LTranv Spinal  LIV  1 SAB 08/23/16 [redacted]w[redacted]d            Birth Comments: 1st trimester     The following portions of the patient's history were reviewed and updated as appropriate: allergies, current medications, past family history, past medical history, past social history, past surgical history, and problem list.  Review of Systems Pertinent items noted in HPI and remainder of comprehensive ROS otherwise negative.   Past medical history, past surgical history, family history and social history were all reviewed and documented in the EPIC chart.   Exam:  There were no vitals filed for this visit. There is no height or weight on file to calculate BMI.  General appearance:  Normal Thyroid:  Symmetrical, normal in size, without palpable masses or nodularity. Respiratory  Auscultation:  Clear without wheezing or rhonchi Cardiovascular  Auscultation:  Regular rate, without rubs, murmurs or gallops  Edema/varicosities:  Not grossly evident Abdominal  Soft,nontender, without masses, guarding or rebound.  Liver/spleen:  No organomegaly noted  Hernia:  None appreciated  Skin  Inspection:  Grossly normal Breasts: Examined lying and sitting.   Right: Without masses, retractions, nipple discharge or axillary adenopathy.   Left: Without masses, retractions, nipple discharge or axillary adenopathy. Genitourinary    Inguinal/mons:  Normal without inguinal adenopathy  External genitalia:  Normal appearing vulva with no masses, tenderness, or lesions  BUS/Urethra/Skene's glands:  Normal without masses or exudate  Vagina:  Normal appearing with normal color and discharge, no lesions  Cervix:  Normal appearing without discharge or lesions  Uterus:  Normal in size, shape and contour.  Mobile, nontender  Adnexa/parametria:     Rt: Normal in size, without masses or tenderness.   Lt: Normal in size, without masses or tenderness.  Anus and perineum: Normal   Patient informed chaperone available to be present for breast and pelvic exam. Patient has requested no chaperone to be present. Patient has been advised what will be completed during breast and pelvic exam.   Assessment/Plan:   There are no diagnoses linked to this encounter.    Discussed SBE, colonoscopy and DEXA screening as directed/appropriate. Recommend 136mins of exercise weekly, including weight bearing exercise. Encouraged the use of seatbelts and sunscreen. Return in 1 year for annual or as needed.   Rubbie Battiest B WHNP-BC 2:16 PM 08/15/2021

## 2021-08-16 ENCOUNTER — Encounter: Payer: Self-pay | Admitting: Radiology

## 2021-08-16 ENCOUNTER — Ambulatory Visit (HOSPITAL_COMMUNITY): Payer: Medicaid Other

## 2021-08-17 ENCOUNTER — Telehealth (HOSPITAL_COMMUNITY): Payer: Self-pay | Admitting: Licensed Clinical Social Worker

## 2021-08-17 NOTE — Telephone Encounter (Signed)
Pt called to inform that she has new apartment to move into at the beginning  of next month- needing assistance with start up costs.  Request help with $380 of deposit which she is currently unable to cover.  Already have required financial information from pt to utilize patient care fund but need invoice and w-9 from [employer]- spoke with company and sent email requesting documents- awaiting response ? ?Jorge Ny, LCSW ?Clinical Social Worker ?[facility] ?Desk#: [phone] ?Cell#: [phone] ? ?

## 2021-08-17 NOTE — Telephone Encounter (Signed)
CSW received requested paperwork back from Ashley Freeman submitted check request for supervisor to review and submit to accounts payable ? ?Will continue to follow and assist as needed ? ?Ashley Ny, LCSW ?Clinical Social Worker ?[facility] ?Desk#: [phone] ?Cell#: [phone] ? ?

## 2021-08-22 NOTE — Telephone Encounter (Signed)
Patient following up with you 

## 2021-08-23 ENCOUNTER — Other Ambulatory Visit: Payer: Self-pay

## 2021-08-23 MED ORDER — INSULIN ASPART 100 UNIT/ML IJ SOLN: 0.0000 [IU] | INTRAMUSCULAR | 3 refills | Status: DC

## 2021-08-23 NOTE — Telephone Encounter (Signed)
Novolog script resent as pharmacy didn't  receive it on 08/14/21 ?

## 2021-08-24 ENCOUNTER — Telehealth (HOSPITAL_COMMUNITY): Payer: Self-pay | Admitting: Licensed Clinical Social Worker

## 2021-08-24 NOTE — Telephone Encounter (Signed)
CSW received call that check for first month rent assistance is ready to pick up.  CSW picked up check and mailed.  CSW called pt inform. ? ?Pt reports there are now some additional unanticipated cost- she states she was not told about the security deposit of $1000 which is due on May 5th.  The complex has already allowed her to move in on April 24th without paying this deposit but report they need it by the 5th- CSW confirmed this with management company that they could not push back this date. ? ?Pt does not have this money available.  Was working but had to quit because she did not have childcare but now child is back in daycare and she is applying for jobs.  Only family she can ask is her Mom but she doesn't get paid until after the 5th. ? ?CSW sent pt list of local assistance agencies.  CSW also connected pt with [employer] housing program who states they can help pt apply for program through [facility] for security deposit assistance. ? ?Will continue to follow and assist as needed ? ?Jorge Ny, LCSW ?Clinical Social Worker ?[facility] ?Desk#: [phone] ?Cell#: [phone] ? ?

## 2021-09-01 ENCOUNTER — Ambulatory Visit (HOSPITAL_COMMUNITY)
Admission: EM | Admit: 2021-09-01 | Discharge: 2021-09-01 | Disposition: A | Discharge status: Home / Self Care | Payer: Medicaid Other | Attending: Family Medicine | Admitting: Family Medicine

## 2021-09-01 ENCOUNTER — Encounter (HOSPITAL_COMMUNITY): Payer: Self-pay

## 2021-09-01 DIAGNOSIS — H60393 Other infective otitis externa, bilateral: Secondary | ICD-10-CM

## 2021-09-01 DIAGNOSIS — H6690 Otitis media, unspecified, unspecified ear: Secondary | ICD-10-CM

## 2021-09-01 DIAGNOSIS — H6693 Otitis media, unspecified, bilateral: Secondary | ICD-10-CM

## 2021-09-01 MED ORDER — CLINDAMYCIN HCL 300 MG PO CAPS: 300.0000 mg | ORAL_CAPSULE | Freq: Three times a day (TID) | ORAL | 0 refills | Status: AC

## 2021-09-01 MED ORDER — NEOMYCIN-POLYMYXIN-HC 3.5-10000-1 OT SUSP: 4.0000 [drp] | Freq: Three times a day (TID) | OTIC | 0 refills | Status: AC

## 2021-09-01 NOTE — Discharge Instructions (Signed)
You were seen today for bilateral ear infection.  ?I have sent out an oral antibiotic to take three times/day, as well as an ear drop to use.  ?You may use motrin or tylenol for pain.  ?Please follow up if not improving or resolved.  ?

## 2021-09-01 NOTE — ED Triage Notes (Signed)
C/o ear pain and pressure x 2 days.  ?

## 2021-09-01 NOTE — ED Provider Notes (Signed)
?[place] ? ? ? ?CSN: [id number] ?Arrival date & time: 09/01/21  2263 ? ? ?  ? ?History   ?Chief Complaint ?No chief complaint on file. ? ? ?HPI ?Ashley Freeman is a 31 y.o. female.  ? ?Patient is here for bilateral ear pain and pressure x 2 days.   ?+ runny nose, congestion.  No fevers.  ?No otc meds taken.  ?+ drainage and pain.  ?She has not been swimming;  has not been cleaning her ears or using anything in her ears.  ? ?Past Medical History:  ?Diagnosis Date  ? Abscess, gluteal, right 08/24/2013  ? AKI (acute kidney injury) ([place]) 07/26/2014  ? Anemia 02/19/2012  ? Bartholin's gland abscess 09/19/2013  ? BV (bacterial vaginosis) 11/24/2015  ? Diabetes mellitus type I ([place]) 2001  ? Diagnosed at age 55 ; Type I  ? Diarrhea 05/30/2016  ? DKA (diabetic ketoacidoses) 08/19/2013  ? Also in 2018  ? Gonorrhea 08/2011  ? Treated in 09/2011  ? History of trichomoniasis 05/31/2016  ? Hyperlipidemia 03/28/2016  ? Sepsis ([place]) 09/19/2013  ? ? ?Patient Active Problem List  ? Diagnosis Date Noted  ? Stage 3b chronic kidney disease ([place]) 08/14/2021  ? DKA (diabetic ketoacidosis) ([place]) 07/26/2021  ? HTN (hypertension), benign 07/26/2021  ? Acute renal failure superimposed on stage 3b chronic kidney disease ([place]) 06/20/2021  ? N&V (nausea and vomiting) 06/20/2021  ? AKI (acute kidney injury) ([place]) 06/20/2021  ? 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 Dyslipidemia 05/26/2021  ? Mild protein malnutrition ([place]) 03/07/2021  ? 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 Systolic ejection murmur 33/54/5625  ? Type 1 diabetes mellitus with hyperglycemia ([place]) 07/24/2019  ? Diabetic retinopathy ([place]) 07/02/2019  ? Proteinuria due to type 1 diabetes mellitus ([place]) 05/21/2019  ? Diarrhea 05/31/2016  ? Diabetic neuropathy ([place]) 01/16/2016  ? Metabolic acidosis 63/89/3734  ? Leukocytosis 02/19/2012  ? Normocytic anemia 02/19/2012  ? ? ?Past Surgical History:   ?Procedure Laterality Date  ? CESAREAN SECTION N/A 10/05/2019  ? Procedure: CESAREAN SECTION;  Surgeon: Aletha Halim, MD;  [facility];  Service: Obstetrics;  Laterality: N/A;  ? INCISION AND DRAINAGE ABSCESS Left 09/28/2019  ? Procedure: INCISION AND DRAINAGE VULVAR ABCESS;  Surgeon: [name], MD;  Location: [facility];  Service: Gynecology;  Laterality: Left;  ? INCISION AND DRAINAGE PERIRECTAL ABSCESS Right 08/18/2013  ? Procedure: IRRIGATION AND DEBRIDEMENT GLUTEAL ABSCESS;  Surgeon: Ralene Ok, MD;  Location: [place];  Service: General;  Laterality: Right;  ? INCISION AND DRAINAGE PERIRECTAL ABSCESS Right 09/19/2013  ? Procedure: IRRIGATION AND DEBRIDEMENT RIGHT GLUTEAL AND LABIAL ABSCESSES;  Surgeon: Ralene Ok, MD;  Location: [place];  Service: General;  Laterality: Right;  ? INCISION AND DRAINAGE PERIRECTAL ABSCESS Right 09/24/2013  ? Procedure: IRRIGATION AND DEBRIDEMENT PERIRECTAL ABSCESS;  Surgeon: Gwenyth Ober, MD;  Location: [facility];  Service: General;  Laterality: Right;  ? ? ?OB History   ? ? Gravida  ?2  ? Para  ?1  ? Term  ?   ? Preterm  ?1  ? AB  ?1  ? Living  ?1  ?  ? ? SAB  ?1  ? IAB  ?   ? Ectopic  ?   ? Multiple  ?0  ? Live Births  ?1  ?   ?  ?  ? ? ? ?Home Medications   ? ?  Prior to Admission medications   ?Medication Sig Start Date End Date Taking? Authorizing Provider  ?amLODipine (NORVASC) 10 MG tablet Take 1 tablet (10 mg total) by mouth daily. 07/13/21 07/08/22  Loel Dubonnet, NP  ?blood glucose meter kit and supplies KIT Dispense based on patient and insurance preference. Use up to four times daily as directed. (FOR ICD-9 250.00, 250.01). 05/25/17   Isla Pence, MD  ?Blood Pressure Monitoring (BLOOD PRESSURE KIT) DEVI 1 Device by Does not apply route as needed. 05/06/19   Anyanwu, Sallyanne Havers, MD  ?carvedilol (COREG) 25 MG tablet Take 1 tablet (25 mg total) by mouth 2 (two) times daily. 08/14/21 02/10/22  Loel Dubonnet, NP  ?Continuous Blood Gluc Sensor (DEXCOM G6  SENSOR) MISC Inject 1 Device into the skin as directed. 12/16/20   Shamleffer, Melanie Crazier, MD  ?Continuous Blood Gluc Transmit (DEXCOM G6 TRANSMITTER) MISC Inject 1 Device into the skin as directed. Use to check blood sugar daily 07/13/21   Shamleffer, Melanie Crazier, MD  ?insulin aspart (NOVOLOG) 100 UNIT/ML injection Inject 0-6 Units into the skin See admin instructions. Uses insulin pump 08/23/21   Shamleffer, Melanie Crazier, MD  ?Insulin Disposable Pump (OMNIPOD 5 G6 INTRO, GEN 5,) KIT 1 Device by Does not apply route every 3 (three) days. 05/24/21   Shamleffer, Melanie Crazier, MD  ?Insulin Disposable Pump (OMNIPOD 5 G6 POD, GEN 5,) MISC 1 Device by Does not apply route every 3 (three) days. 05/24/21   Shamleffer, Melanie Crazier, MD  ?Insulin Disposable Pump (OMNIPOD 5 G6 POD, GEN 5,) MISC 1 Device by Does not apply route every 3 (three) days. 07/25/21   Shamleffer, Melanie Crazier, MD  ?insulin glargine (LANTUS) 100 UNIT/ML injection Inject 0.1 mLs (10 Units total) into the skin daily. 04/14/21   Kayleen Memos, DO  ?Insulin Pen Needle 32G X 4 MM MISC Use as directed 10/07/20   Ghimire, Henreitta Leber, MD  ?lurasidone (LATUDA) 40 MG TABS tablet Take 40 mg by mouth daily with breakfast.    [provider]  ? ? ?Family History ?Family History  ?Problem Relation Age of Onset  ? Asthma Mother   ? Carpal tunnel syndrome Mother   ? Gout Father   ? Diabetes Paternal Grandmother   ? Anesthesia problems Neg Hx   ? ? ?Social History ?Social History  ? ?Tobacco Use  ? Smoking status: Never  ? Smokeless tobacco: Never  ?Vaping Use  ? Vaping Use: Never used  ?Substance Use Topics  ? Alcohol use: Not Currently  ? Drug use: No  ? ? ? ?Allergies   ?Cephalexin, Penicillins, Benadryl [diphenhydramine], and Doxycycline ? ? ?Review of Systems ?Review of Systems  ?Constitutional: Negative.   ?HENT:  Positive for congestion, ear discharge, ear pain and rhinorrhea.   ?Respiratory: Negative.    ?Cardiovascular: Negative.    ?Gastrointestinal: Negative.   ?Genitourinary: Negative.   ?Musculoskeletal: Negative.   ? ? ?Physical Exam ?Triage Vital Signs ?ED Triage Vitals [09/01/21 1041]  ?Enc Vitals Group  ?   BP (!) 158/92  ?   Pulse Rate 94  ?   Resp 16  ?   Temp 98.1 ?F (36.7 ?C)  ?   Temp Source Oral  ?   SpO2 98 %  ?   Weight   ?   Height   ?   Head Circumference   ?   Peak Flow   ?   Pain Score   ?   Pain Loc   ?  Pain Edu?   ?   Excl. in [place]?   ? ?No data found. ? ?Updated Vital Signs ?BP (!) 158/92 (BP Location: Left Arm)   Pulse 94   Temp 98.1 ?F (36.7 ?C) (Oral)   Resp 16   SpO2 98%  ? ?Visual Acuity ?Right Eye Distance:   ?Left Eye Distance:   ?Bilateral Distance:   ? ?Right Eye Near:   ?Left Eye Near:    ?Bilateral Near:    ? ?Physical Exam ?Constitutional:   ?   Appearance: Normal appearance.  ?HENT:  ?   Head: Normocephalic.  ?   Right Ear: Drainage, swelling and tenderness present.  ?   Left Ear: Drainage, swelling and tenderness present. Tympanic membrane is erythematous.  ?Cardiovascular:  ?   Rate and Rhythm: Normal rate and regular rhythm.  ?Pulmonary:  ?   Effort: Pulmonary effort is normal.  ?   Breath sounds: Normal breath sounds.  ?Musculoskeletal:  ?   Cervical back: Normal range of motion and neck supple. Tenderness present.  ?Lymphadenopathy:  ?   Cervical: Cervical adenopathy present.  ?Neurological:  ?   Mental Status: She is alert.  ? ? ? ?UC Treatments / Results  ?Labs ?(all labs ordered are listed, but only abnormal results are displayed) ?Labs Reviewed - No data to display ? ?EKG ? ? ?Radiology ?No results found. ? ?Procedures ?Procedures (including critical care time) ? ?Medications Ordered in UC ?Medications - No data to display ? ?Initial Impression / Assessment and Plan / UC Course  ?I have reviewed the triage vital signs and the nursing notes. ? ?Pertinent labs & imaging results that were available during my care of the patient were reviewed by me and considered in my medical decision making (see  chart for details). ? ?  ?Final Clinical Impressions(s) / UC Diagnoses  ? ?Final diagnoses:  ?Acute otitis media, unspecified otitis media type  ?Infective otitis externa of both ears  ? ? ? ?Discharge Instructions   ? ?  ?Darreld Mclean

## 2021-09-08 ENCOUNTER — Other Ambulatory Visit (HOSPITAL_COMMUNITY): Payer: Self-pay

## 2021-09-11 ENCOUNTER — Other Ambulatory Visit (HOSPITAL_COMMUNITY): Payer: Self-pay

## 2021-09-11 ENCOUNTER — Telehealth: Payer: Self-pay | Admitting: Pharmacy Technician

## 2021-09-11 NOTE — Telephone Encounter (Signed)
Patient Advocate Encounter ?  ?Received notification from CoverMyMeds that prior authorization for Dexcom Sensors is due for renewal. ?  ?PA submitted on 09/11/21 ?Key [id number] ?Status is pending ?   ?[facility] will continue to follow: ? ? ? ?

## 2021-09-14 ENCOUNTER — Ambulatory Visit: Payer: Medicaid Other | Admitting: Advanced Practice Midwife

## 2021-09-15 ENCOUNTER — Other Ambulatory Visit: Payer: Self-pay

## 2021-09-15 ENCOUNTER — Telehealth: Payer: Self-pay | Admitting: Internal Medicine

## 2021-09-15 ENCOUNTER — Ambulatory Visit: Payer: Medicaid Other | Admitting: Internal Medicine

## 2021-09-15 MED ORDER — DEXCOM G6 TRANSMITTER MISC: 1.0000 | 3 refills | Status: DC

## 2021-09-15 MED ORDER — DEXCOM G6 SENSOR MISC: 1.0000 | 3 refills | Status: DC

## 2021-09-15 NOTE — Telephone Encounter (Signed)
Done

## 2021-09-15 NOTE — Telephone Encounter (Signed)
Patient had to reschedule appointment due to not feeling well and first available was September 2023.  Patient states that she is almost out of  Dexcom G6 Sensor Continuous Blood Gluc Sensor (DEXCOM G6 SENSOR) MISC  Dexcom G6 Transmitter Continuous Blood Gluc Transmit (DEXCOM G6 TRANSMITTER) MISC

## 2021-09-20 ENCOUNTER — Ambulatory Visit: Payer: Medicaid Other | Admitting: Internal Medicine

## 2021-09-24 DIAGNOSIS — R87612 Low grade squamous intraepithelial lesion on cytologic smear of cervix (LGSIL): Secondary | ICD-10-CM | POA: Insufficient documentation

## 2021-09-28 ENCOUNTER — Other Ambulatory Visit (HOSPITAL_COMMUNITY): Payer: Self-pay

## 2021-09-28 NOTE — Telephone Encounter (Signed)
Made follow up phone call to pt's ins to check status of PA renewal.  PA was renewed 09/11/21 through 09/12/22. Pharmacy already filled sensors 5/19 Transmitter PA is on file through 01/14/23. We can request a renewal up to 30 days in advance.

## 2021-10-02 MED ORDER — INSULIN ASPART 100 UNIT/ML IJ SOLN: INTRAMUSCULAR | 3 refills | Status: DC

## 2021-10-16 ENCOUNTER — Ambulatory Visit (HOSPITAL_BASED_OUTPATIENT_CLINIC_OR_DEPARTMENT_OTHER): Payer: Medicaid Other | Admitting: Family

## 2021-11-06 ENCOUNTER — Inpatient Hospital Stay (HOSPITAL_COMMUNITY)
Admission: EM | Admit: 2021-11-06 | Discharge: 2021-11-11 | DRG: 638 | Disposition: A | Discharge status: Home / Self Care | Payer: Medicaid Other | Attending: Internal Medicine | Admitting: Internal Medicine

## 2021-11-06 ENCOUNTER — Encounter (HOSPITAL_COMMUNITY): Payer: Self-pay

## 2021-11-06 ENCOUNTER — Other Ambulatory Visit: Payer: Self-pay

## 2021-11-06 ENCOUNTER — Inpatient Hospital Stay (HOSPITAL_COMMUNITY): Payer: Medicaid Other

## 2021-11-06 ENCOUNTER — Emergency Department (HOSPITAL_COMMUNITY): Payer: Medicaid Other

## 2021-11-06 DIAGNOSIS — I10 Essential (primary) hypertension: Secondary | ICD-10-CM

## 2021-11-06 DIAGNOSIS — Z9641 Presence of insulin pump (external) (internal): Secondary | ICD-10-CM | POA: Diagnosis present

## 2021-11-06 DIAGNOSIS — Z794 Long term (current) use of insulin: Secondary | ICD-10-CM | POA: Diagnosis not present

## 2021-11-06 DIAGNOSIS — Z79899 Other long term (current) drug therapy: Secondary | ICD-10-CM | POA: Diagnosis not present

## 2021-11-06 DIAGNOSIS — L03811 Cellulitis of head [any part, except face]: Secondary | ICD-10-CM | POA: Diagnosis not present

## 2021-11-06 DIAGNOSIS — D72829 Elevated white blood cell count, unspecified: Secondary | ICD-10-CM | POA: Diagnosis present

## 2021-11-06 DIAGNOSIS — A0472 Enterocolitis due to Clostridium difficile, not specified as recurrent: Secondary | ICD-10-CM | POA: Diagnosis present

## 2021-11-06 DIAGNOSIS — E875 Hyperkalemia: Secondary | ICD-10-CM

## 2021-11-06 DIAGNOSIS — N184 Chronic kidney disease, stage 4 (severe): Secondary | ICD-10-CM | POA: Diagnosis present

## 2021-11-06 DIAGNOSIS — Z880 Allergy status to penicillin: Secondary | ICD-10-CM

## 2021-11-06 DIAGNOSIS — Z881 Allergy status to other antibiotic agents status: Secondary | ICD-10-CM

## 2021-11-06 DIAGNOSIS — Z825 Family history of asthma and other chronic lower respiratory diseases: Secondary | ICD-10-CM

## 2021-11-06 DIAGNOSIS — E1110 Type 2 diabetes mellitus with ketoacidosis without coma: Secondary | ICD-10-CM | POA: Diagnosis present

## 2021-11-06 DIAGNOSIS — D631 Anemia in chronic kidney disease: Secondary | ICD-10-CM | POA: Diagnosis present

## 2021-11-06 DIAGNOSIS — Z888 Allergy status to other drugs, medicaments and biological substances status: Secondary | ICD-10-CM | POA: Diagnosis not present

## 2021-11-06 DIAGNOSIS — R7989 Other specified abnormal findings of blood chemistry: Secondary | ICD-10-CM

## 2021-11-06 DIAGNOSIS — D649 Anemia, unspecified: Secondary | ICD-10-CM | POA: Diagnosis present

## 2021-11-06 DIAGNOSIS — N179 Acute kidney failure, unspecified: Secondary | ICD-10-CM | POA: Diagnosis present

## 2021-11-06 DIAGNOSIS — R6510 Systemic inflammatory response syndrome (SIRS) of non-infectious origin without acute organ dysfunction: Secondary | ICD-10-CM | POA: Diagnosis present

## 2021-11-06 DIAGNOSIS — E1010 Type 1 diabetes mellitus with ketoacidosis without coma: Principal | ICD-10-CM

## 2021-11-06 DIAGNOSIS — E1022 Type 1 diabetes mellitus with diabetic chronic kidney disease: Secondary | ICD-10-CM | POA: Diagnosis present

## 2021-11-06 DIAGNOSIS — E1159 Type 2 diabetes mellitus with other circulatory complications: Secondary | ICD-10-CM

## 2021-11-06 DIAGNOSIS — Z833 Family history of diabetes mellitus: Secondary | ICD-10-CM

## 2021-11-06 LAB — URINALYSIS, ROUTINE W REFLEX MICROSCOPIC
Bilirubin Urine: NEGATIVE
Glucose, UA: >=500 mg/dL — AB
Ketones, ur: 80 mg/dL — AB
Nitrite: NEGATIVE
Protein, ur: >=300 mg/dL — AB
Specific Gravity, Urine: 1.014 (ref 1.005–1.030)
pH: 6 (ref 5.0–8.0)

## 2021-11-06 LAB — CBG MONITORING, ED
Glucose-Capillary: >600 mg/dL (ref 70–99)
Glucose-Capillary: >600 mg/dL (ref 70–99)

## 2021-11-06 LAB — BASIC METABOLIC PANEL
Anion gap: 13 (ref 5–15)
Anion gap: 10 (ref 5–15)
BUN: 67 mg/dL — ABNORMAL HIGH (ref 6–20)
BUN: 65 mg/dL — ABNORMAL HIGH (ref 6–20)
BUN: 63 mg/dL — ABNORMAL HIGH (ref 6–20)
CO2: <7 mmol/L — ABNORMAL LOW (ref 22–32)
CO2: 9 mmol/L — ABNORMAL LOW (ref 22–32)
CO2: 12 mmol/L — ABNORMAL LOW (ref 22–32)
Calcium: 8.2 mg/dL — ABNORMAL LOW (ref 8.9–10.3)
Calcium: 8 mg/dL — ABNORMAL LOW (ref 8.9–10.3)
Calcium: 8 mg/dL — ABNORMAL LOW (ref 8.9–10.3)
Chloride: 111 mmol/L (ref 98–111)
Chloride: 113 mmol/L — ABNORMAL HIGH (ref 98–111)
Chloride: 116 mmol/L — ABNORMAL HIGH (ref 98–111)
Creatinine, Ser: 5.33 mg/dL — ABNORMAL HIGH (ref 0.44–1.00)
Creatinine, Ser: 5.24 mg/dL — ABNORMAL HIGH (ref 0.44–1.00)
Creatinine, Ser: 4.84 mg/dL — ABNORMAL HIGH (ref 0.44–1.00)
GFR, Estimated: 10 mL/min — ABNORMAL LOW (ref >60)
GFR, Estimated: 11 mL/min — ABNORMAL LOW (ref >60)
GFR, Estimated: 12 mL/min — ABNORMAL LOW (ref >60)
Glucose, Bld: 605 mg/dL (ref 70–99)
Glucose, Bld: 429 mg/dL — ABNORMAL HIGH (ref 70–99)
Glucose, Bld: 142 mg/dL — ABNORMAL HIGH (ref 70–99)
Potassium: 4.1 mmol/L (ref 3.5–5.1)
Potassium: 3.8 mmol/L (ref 3.5–5.1)
Potassium: 3.7 mmol/L (ref 3.5–5.1)
Sodium: 134 mmol/L — ABNORMAL LOW (ref 135–145)
Sodium: 135 mmol/L (ref 135–145)
Sodium: 138 mmol/L (ref 135–145)

## 2021-11-06 LAB — CBC WITH DIFFERENTIAL/PLATELET
Abs Immature Granulocytes: 0.55 10*3/uL — ABNORMAL HIGH (ref 0.00–0.07)
Basophils Absolute: 0.1 10*3/uL (ref 0.0–0.1)
Basophils Relative: 0 %
Eosinophils Absolute: 0 10*3/uL (ref 0.0–0.5)
Eosinophils Relative: 0 %
HCT: 26.4 % — ABNORMAL LOW (ref 36.0–46.0)
Hemoglobin: 8.1 g/dL — ABNORMAL LOW (ref 12.0–15.0)
Immature Granulocytes: 2 %
Lymphocytes Relative: 10 %
Lymphs Abs: 2.4 10*3/uL (ref 0.7–4.0)
MCH: 27.7 pg (ref 26.0–34.0)
MCHC: 30.7 g/dL (ref 30.0–36.0)
MCV: 90.4 fL (ref 80.0–100.0)
Monocytes Absolute: 1.2 10*3/uL — ABNORMAL HIGH (ref 0.1–1.0)
Monocytes Relative: 5 %
Neutro Abs: 19.6 10*3/uL — ABNORMAL HIGH (ref 1.7–7.7)
Neutrophils Relative %: 83 %
Platelets: 320 10*3/uL (ref 150–400)
RBC: 2.92 MIL/uL — ABNORMAL LOW (ref 3.87–5.11)
RDW: 15.2 % (ref 11.5–15.5)
WBC: 23.8 10*3/uL — ABNORMAL HIGH (ref 4.0–10.5)
nRBC: 0 % (ref 0.0–0.2)

## 2021-11-06 LAB — COMPREHENSIVE METABOLIC PANEL
ALT: 18 U/L (ref 0–44)
AST: 19 U/L (ref 15–41)
Albumin: 2.2 g/dL — ABNORMAL LOW (ref 3.5–5.0)
Alkaline Phosphatase: 84 U/L (ref 38–126)
BUN: 69 mg/dL — ABNORMAL HIGH (ref 6–20)
CO2: <7 mmol/L — ABNORMAL LOW (ref 22–32)
Calcium: 8.3 mg/dL — ABNORMAL LOW (ref 8.9–10.3)
Chloride: 103 mmol/L (ref 98–111)
Creatinine, Ser: 5.54 mg/dL — ABNORMAL HIGH (ref 0.44–1.00)
GFR, Estimated: 10 mL/min — ABNORMAL LOW (ref >60)
Glucose, Bld: 871 mg/dL (ref 70–99)
Potassium: 5.4 mmol/L — ABNORMAL HIGH (ref 3.5–5.1)
Sodium: 129 mmol/L — ABNORMAL LOW (ref 135–145)
Total Bilirubin: 1.2 mg/dL (ref 0.3–1.2)
Total Protein: 6.4 g/dL — ABNORMAL LOW (ref 6.5–8.1)

## 2021-11-06 LAB — GLUCOSE, CAPILLARY
Glucose-Capillary: >600 mg/dL (ref 70–99)
Glucose-Capillary: 595 mg/dL (ref 70–99)
Glucose-Capillary: 542 mg/dL (ref 70–99)
Glucose-Capillary: 591 mg/dL (ref 70–99)
Glucose-Capillary: 511 mg/dL (ref 70–99)
Glucose-Capillary: 550 mg/dL (ref 70–99)
Glucose-Capillary: 483 mg/dL — ABNORMAL HIGH (ref 70–99)
Glucose-Capillary: 424 mg/dL — ABNORMAL HIGH (ref 70–99)
Glucose-Capillary: 461 mg/dL — ABNORMAL HIGH (ref 70–99)
Glucose-Capillary: 391 mg/dL — ABNORMAL HIGH (ref 70–99)
Glucose-Capillary: 332 mg/dL — ABNORMAL HIGH (ref 70–99)
Glucose-Capillary: 246 mg/dL — ABNORMAL HIGH (ref 70–99)
Glucose-Capillary: 189 mg/dL — ABNORMAL HIGH (ref 70–99)
Glucose-Capillary: 165 mg/dL — ABNORMAL HIGH (ref 70–99)
Glucose-Capillary: 121 mg/dL — ABNORMAL HIGH (ref 70–99)

## 2021-11-06 LAB — IRON AND TIBC
Iron: 24 ug/dL — ABNORMAL LOW (ref 28–170)
Saturation Ratios: 16 % (ref 10.4–31.8)
TIBC: 153 ug/dL — ABNORMAL LOW (ref 250–450)
UIBC: 129 ug/dL

## 2021-11-06 LAB — BLOOD GAS, VENOUS
Acid-base deficit: 25.6 mmol/L — ABNORMAL HIGH (ref 0.0–2.0)
Bicarbonate: 2.3 mmol/L — ABNORMAL LOW (ref 20.0–28.0)
O2 Saturation: 95.9 %
Patient temperature: 37
pCO2, Ven: <18 mmHg — CL (ref 44–60)
pH, Ven: 7.07 — CL (ref 7.25–7.43)
pO2, Ven: 79 mmHg — ABNORMAL HIGH (ref 32–45)

## 2021-11-06 LAB — BETA-HYDROXYBUTYRIC ACID
Beta-Hydroxybutyric Acid: 7.12 mmol/L — ABNORMAL HIGH (ref 0.05–0.27)
Beta-Hydroxybutyric Acid: 0.88 mmol/L — ABNORMAL HIGH (ref 0.05–0.27)
Beta-Hydroxybutyric Acid: 0.21 mmol/L (ref 0.05–0.27)

## 2021-11-06 LAB — HCG, QUANTITATIVE, PREGNANCY: hCG, Beta Chain, Quant, S: 1 m[IU]/mL (ref <5)

## 2021-11-06 LAB — I-STAT BETA HCG BLOOD, ED (MC, WL, AP ONLY): I-stat hCG, quantitative: 51.6 m[IU]/mL — ABNORMAL HIGH (ref <5)

## 2021-11-06 LAB — C DIFFICILE QUICK SCREEN W PCR REFLEX
C Diff antigen: POSITIVE — AB
C Diff toxin: NEGATIVE

## 2021-11-06 LAB — MRSA NEXT GEN BY PCR, NASAL: MRSA by PCR Next Gen: NOT DETECTED

## 2021-11-06 MED ORDER — SODIUM CHLORIDE 0.9 % IV BOLUS
1000.0000 mL | Freq: Once | INTRAVENOUS | Status: AC
  Administered 2021-11-06: 1000 mL via INTRAVENOUS

## 2021-11-06 MED ORDER — DEXTROSE 50 % IV SOLN: 0.0000 mL | INTRAVENOUS | Status: DC | PRN

## 2021-11-06 MED ORDER — ORAL CARE MOUTH RINSE: 15.0000 mL | OROMUCOSAL | Status: DC | PRN

## 2021-11-06 MED ORDER — METOPROLOL TARTRATE 5 MG/5ML IV SOLN
5.0000 mg | Freq: Four times a day (QID) | INTRAVENOUS | Status: DC | PRN
  Administered 2021-11-07: 5 mg via INTRAVENOUS
  Filled 2021-11-06: qty 5

## 2021-11-06 MED ORDER — LACTATED RINGERS IV SOLN: INTRAVENOUS | Status: DC

## 2021-11-06 MED ORDER — LACTATED RINGERS IV BOLUS
20.0000 mL/kg | Freq: Once | INTRAVENOUS | Status: AC
  Administered 2021-11-06: 1140 mL via INTRAVENOUS

## 2021-11-06 MED ORDER — BUTALBITAL-APAP-CAFFEINE 50-325-40 MG PO TABS
1.0000 | ORAL_TABLET | Freq: Once | ORAL | Status: AC
Start: 2021-11-06 — End: 2021-11-06
  Administered 2021-11-06: 1 via ORAL
  Filled 2021-11-06: qty 1

## 2021-11-06 MED ORDER — CHLORHEXIDINE GLUCONATE CLOTH 2 % EX PADS
6.0000 | MEDICATED_PAD | Freq: Every day | CUTANEOUS | Status: DC
Start: 2021-11-07 — End: 2021-11-11
  Administered 2021-11-07 – 2021-11-10 (×2): 6 via TOPICAL

## 2021-11-06 MED ORDER — DEXTROSE IN LACTATED RINGERS 5 % IV SOLN
INTRAVENOUS | Status: DC
Start: 2021-11-06 — End: 2021-11-07

## 2021-11-06 MED ORDER — INSULIN REGULAR(HUMAN) IN NACL 100-0.9 UT/100ML-% IV SOLN
INTRAVENOUS | Status: DC
Start: 2021-11-06 — End: 2021-11-07
  Administered 2021-11-06: 2.2 [IU]/h via INTRAVENOUS
  Administered 2021-11-06: 5 [IU]/h via INTRAVENOUS
  Filled 2021-11-06 (×2): qty 100

## 2021-11-06 NOTE — ED Triage Notes (Signed)
Pt arrived via POV, c/o hyperglycemia. States she feels like she is going into DKA, hx of same. CBG reading high in triage. States she grabbed the wrong omnipod and has not been having her normal insulin.

## 2021-11-06 NOTE — ED Notes (Signed)
Chem 8  read recollect bad sample 3 times.  Informed RN and MD.

## 2021-11-06 NOTE — H&P (Signed)
History and Physical    Patient: Ashley Freeman VQM:[id number] DOB: 19-Apr-1991 DOA: 11/06/2021 DOS: the patient was seen and examined on 11/06/2021 PCP: System, Provider Not In  Patient coming from: Home  Chief Complaint:  Chief Complaint  Patient presents with   Hyperglycemia   HPI: Ashley Freeman is a 31 y.o. female with medical history significant of DKA, HTN, normocytic anemia, CKD4. Presenting with hyperglycemia. She reports that this morning she was feeling short of breath and fatigued. She immediately thought her sugar must be off. She checked it and it said 'HIGH'. She thought she might be in DKA, so she came to the hospital for help. She denies any recent fever, cough, urinary symptoms. She reports she thinks her omnipod was not working. She denies any other aggravating or alleviating factors.   Review of Systems: As mentioned in the history of present illness. All other systems reviewed and are negative. Past Medical History:  Diagnosis Date   Abscess, gluteal, right 08/24/2013   AKI (acute kidney injury) ([place]) 07/26/2014   Anemia 02/19/2012   Bartholin's gland abscess 09/19/2013   BV (bacterial vaginosis) 11/24/2015   Diabetes mellitus type I ([place]) 2001   Diagnosed at age 64 ; Type I   Diarrhea 05/30/2016   DKA (diabetic ketoacidoses) 08/19/2013   Also in 2018   Gonorrhea 08/2011   Treated in 09/2011   History of trichomoniasis 05/31/2016   Hyperlipidemia 03/28/2016   Sepsis ([place]) 09/19/2013   Past Surgical History:  Procedure Laterality Date   CESAREAN SECTION N/A 10/05/2019   Procedure: CESAREAN SECTION;  Surgeon: Aletha Halim, MD;  [facility];  Service: Obstetrics;  Laterality: N/A;   INCISION AND DRAINAGE ABSCESS Left 09/28/2019   Procedure: INCISION AND DRAINAGE VULVAR ABCESS;  Surgeon: [name], MD;  Location: [facility];  Service: Gynecology;  Laterality: Left;   INCISION AND DRAINAGE PERIRECTAL ABSCESS Right 08/18/2013    Procedure: IRRIGATION AND DEBRIDEMENT GLUTEAL ABSCESS;  Surgeon: Ralene Ok, MD;  Location: [place];  Service: General;  Laterality: Right;   INCISION AND DRAINAGE PERIRECTAL ABSCESS Right 09/19/2013   Procedure: IRRIGATION AND DEBRIDEMENT RIGHT GLUTEAL AND LABIAL ABSCESSES;  Surgeon: Ralene Ok, MD;  Location: [place];  Service: General;  Laterality: Right;   INCISION AND DRAINAGE PERIRECTAL ABSCESS Right 09/24/2013   Procedure: IRRIGATION AND DEBRIDEMENT PERIRECTAL ABSCESS;  Surgeon: Gwenyth Ober, MD;  Location: [place];  Service: General;  Laterality: Right;   Social History:  reports that she has never smoked. She has never used smokeless tobacco. She reports that she does not currently use alcohol. She reports that she does not use drugs.  Allergies  Allergen Reactions   Cephalexin Anaphylaxis    Has gotten ceftriaxone in the past    Penicillins Hives and Rash    Has patient had a PCN reaction causing immediate rash, facial/tongue/throat swelling, SOB or lightheadedness with hypotension: Yes Has patient had a PCN reaction causing severe rash involving mucus membranes or skin necrosis: No Has patient had a PCN reaction that required hospitalization: Yes Has patient had a PCN reaction occurring within the last 10 years: No Spoke with pt - childhood hives told by mom, tried no pcns since, doesn't remember reaction herself    Benadryl [Diphenhydramine] Itching   Doxycycline Itching    Family History  Problem Relation Age of Onset   Asthma Mother    Carpal tunnel syndrome Mother    Gout Father    Diabetes Paternal Grandmother  Anesthesia problems Neg Hx     Prior to Admission medications   Medication Sig Start Date End Date Taking? Authorizing Provider  amLODipine (NORVASC) 10 MG tablet Take 1 tablet (10 mg total) by mouth daily. 07/13/21 07/08/22  Loel Dubonnet, NP  blood glucose meter kit and supplies KIT Dispense based on patient and insurance preference. Use up to four  times daily as directed. (FOR ICD-9 250.00, 250.01). 05/25/17   Isla Pence, MD  Blood Pressure Monitoring (BLOOD PRESSURE KIT) DEVI 1 Device by Does not apply route as needed. 05/06/19   Anyanwu, Sallyanne Havers, MD  carvedilol (COREG) 25 MG tablet Take 1 tablet (25 mg total) by mouth 2 (two) times daily. 08/14/21 02/10/22  Loel Dubonnet, NP  Continuous Blood Gluc Sensor (DEXCOM G6 SENSOR) MISC Inject 1 Device into the skin as directed. 09/15/21   Shamleffer, Melanie Crazier, MD  Continuous Blood Gluc Transmit (DEXCOM G6 TRANSMITTER) MISC Inject 1 Device into the skin as directed. Use to check blood sugar daily 09/15/21   Shamleffer, Melanie Crazier, MD  insulin aspart (NOVOLOG) 100 UNIT/ML injection Max daily 50 units .Uses insulin pump 10/02/21   Shamleffer, Melanie Crazier, MD  Insulin Disposable Pump (OMNIPOD 5 G6 INTRO, GEN 5,) KIT 1 Device by Does not apply route every 3 (three) days. 05/24/21   Shamleffer, Melanie Crazier, MD  Insulin Disposable Pump (OMNIPOD 5 G6 POD, GEN 5,) MISC 1 Device by Does not apply route every 3 (three) days. 05/24/21   Shamleffer, Melanie Crazier, MD  Insulin Disposable Pump (OMNIPOD 5 G6 POD, GEN 5,) MISC 1 Device by Does not apply route every 3 (three) days. 07/25/21   Shamleffer, Melanie Crazier, MD  Insulin Pen Needle 32G X 4 MM MISC Use as directed 10/07/20   Ghimire, Henreitta Leber, MD  lurasidone (LATUDA) 40 MG TABS tablet Take 40 mg by mouth daily with breakfast.    [provider]    Physical Exam: Vitals:   11/06/21 0755 11/06/21 0838 11/06/21 0900 11/06/21 0930  BP: 134/76 139/70 138/71 137/77  Pulse: (!) 112 (!) 114 (!) 109 (!) 108  Resp: 16 20 (!) 22 20  Temp: 98.2 F (36.8 C)     TempSrc: Oral     SpO2: 99% 100% 100% 100%   General: 31 y.o. female resting in bed in NAD Eyes: PERRL, normal sclera ENMT: Nares patent w/o discharge, orophaynx clear, dentition normal, ears w/o discharge/lesions/ulcers Neck: Supple, trachea midline Cardiovascular: RRR,  +S1, S2, no m/g/r, equal pulses throughout Respiratory: CTABL, no w/r/r, normal WOB GI: BS+, NDNT, no masses noted, no organomegaly noted MSK: No e/c/c Neuro: A&O x 3, no focal deficits Psyc: Appropriate interaction and affect, calm/cooperative  Data Reviewed:  Na+  129 K+  5.4 CO2 < 7 Glucose  871 BUN  69 SCr  5.54 Ca2+  8.3 Albumin  2.2 WBC  23.8 Hgb  8.1 Plt  320 Istat 51.6 HCG quant  1  CXR: No active cardiopulmonary disease.  Assessment and Plan: DKA     - admit to inpt, SDU     - Endotool: insulin gtt, fluids     - check A1c     - NPO except for non-caloric fluids and meds     - DM coordinator      AKI on CKD4     - fluids     - watch nephrotoxins     - check renal US  HTN     - continue home regimen  Hyperkalemia     - continue fluids, insulin     - trend for now  Normocytic anemia     - check iron studies     - no evidence of bleed  Elevated istat hCG     - denies possibility of her being pregnant     - HGC quant is negative  Leukocytosis SIRS     - she looks pretty dry     - fluids     - CXR is negative, no respiratory or urinary symptoms     - check UA  Pseudohyponatremia     - corrects to 4.8  Pseudohypocalcemia     - corrects to 9.7  Advance Care Planning:   Code Status: FULL  Consults: None  Family Communication: None at bedside  Severity of Illness: The appropriate patient status for this patient is INPATIENT. Inpatient status is judged to be reasonable and necessary in order to provide the required intensity of service to ensure the patient's safety. The patient's presenting symptoms, physical exam findings, and initial radiographic and laboratory data in the context of their chronic comorbidities is felt to place them at high risk for further clinical deterioration. Furthermore, it is not anticipated that the patient will be medically stable for discharge from the hospital within 2 midnights of admission.   * I certify that  at the point of admission it is my clinical judgment that the patient will require inpatient hospital care spanning beyond 2 midnights from the point of admission due to high intensity of service, high risk for further deterioration and high frequency of surveillance required.*  Author: Jonnie Finner, DO 11/06/2021 9:48 AM  For on call review [email or web address].

## 2021-11-06 NOTE — ED Provider Notes (Signed)
[facility] DEPT Provider Note   CSN: [id number] Arrival date & time: 11/06/21  0745     History  Chief Complaint  Patient presents with   Hyperglycemia    Ashley Freeman is a 31 y.o. female.  31 year old female with prior medical history as detailed below presents for evaluation. Patient reports that she feels like she is in DKA.  Patient reports that yesterday she may have inadvertently picked up an old OmniPod yesterday.  She feels that this may have precipitated her current symptoms.  Complaints of fatigue.  She reports that when she feels like this her sugars are typically high and she is typically in DKA.  Accu-Chek in triage is >600.  Patient reports that she did not check her sugar at home.  She denies recent illness such as fever, cough, congestion, abdominal pain, shortness of breath, nausea, vomiting, other complaint.  The history is provided by the patient and medical records.  Hyperglycemia Blood sugar level PTA:  >600 Severity:  Severe Onset quality:  Gradual Duration:  1 day      Home Medications Prior to Admission medications   Medication Sig Start Date End Date Taking? Authorizing Provider  amLODipine (NORVASC) 10 MG tablet Take 1 tablet (10 mg total) by mouth daily. 07/13/21 07/08/22  Loel Dubonnet, NP  blood glucose meter kit and supplies KIT Dispense based on patient and insurance preference. Use up to four times daily as directed. (FOR ICD-9 250.00, 250.01). 05/25/17   Isla Pence, MD  Blood Pressure Monitoring (BLOOD PRESSURE KIT) DEVI 1 Device by Does not apply route as needed. 05/06/19   Anyanwu, Sallyanne Havers, MD  carvedilol (COREG) 25 MG tablet Take 1 tablet (25 mg total) by mouth 2 (two) times daily. 08/14/21 02/10/22  Loel Dubonnet, NP  Continuous Blood Gluc Sensor (DEXCOM G6 SENSOR) MISC Inject 1 Device into the skin as directed. 09/15/21   Shamleffer, Melanie Crazier, MD  Continuous Blood Gluc  Transmit (DEXCOM G6 TRANSMITTER) MISC Inject 1 Device into the skin as directed. Use to check blood sugar daily 09/15/21   Shamleffer, Melanie Crazier, MD  insulin aspart (NOVOLOG) 100 UNIT/ML injection Max daily 50 units .Uses insulin pump 10/02/21   Shamleffer, Melanie Crazier, MD  Insulin Disposable Pump (OMNIPOD 5 G6 INTRO, GEN 5,) KIT 1 Device by Does not apply route every 3 (three) days. 05/24/21   Shamleffer, Melanie Crazier, MD  Insulin Disposable Pump (OMNIPOD 5 G6 POD, GEN 5,) MISC 1 Device by Does not apply route every 3 (three) days. 05/24/21   Shamleffer, Melanie Crazier, MD  Insulin Disposable Pump (OMNIPOD 5 G6 POD, GEN 5,) MISC 1 Device by Does not apply route every 3 (three) days. 07/25/21   Shamleffer, Melanie Crazier, MD  Insulin Pen Needle 32G X 4 MM MISC Use as directed 10/07/20   Ghimire, Henreitta Leber, MD  lurasidone (LATUDA) 40 MG TABS tablet Take 40 mg by mouth daily with breakfast.    [provider]      Allergies    Cephalexin, Penicillins, Benadryl [diphenhydramine], and Doxycycline    Review of Systems   Review of Systems  All other systems reviewed and are negative.   Physical Exam Updated Vital Signs BP 134/76 (BP Location: Right Arm)   Pulse (!) 112   Temp 98.2 F (36.8 C) (Oral)   Resp 16   SpO2 99%  Physical Exam Vitals and nursing note reviewed.  Constitutional:      General: She is not  in acute distress.    Appearance: Normal appearance. She is well-developed.  HENT:     Head: Normocephalic and atraumatic.     Mouth/Throat:     Mouth: Mucous membranes are dry.  Eyes:     Conjunctiva/sclera: Conjunctivae normal.     Pupils: Pupils are equal, round, and reactive to light.  Cardiovascular:     Rate and Rhythm: Normal rate and regular rhythm.     Heart sounds: Normal heart sounds.  Pulmonary:     Effort: Pulmonary effort is normal. No respiratory distress.     Breath sounds: Normal breath sounds.  Abdominal:     General: There is no  distension.     Palpations: Abdomen is soft.     Tenderness: There is no abdominal tenderness.  Musculoskeletal:        General: No deformity. Normal range of motion.     Cervical back: Normal range of motion and neck supple.  Skin:    General: Skin is warm and dry.  Neurological:     General: No focal deficit present.     Mental Status: She is alert and oriented to person, place, and time.     ED Results / Procedures / Treatments   Labs (all labs ordered are listed, but only abnormal results are displayed) Labs Reviewed  CBG MONITORING, ED - Abnormal; Notable for the following components:      Result Value   Glucose-Capillary >600 (*)    All other components within normal limits  CBC WITH DIFFERENTIAL/PLATELET  COMPREHENSIVE METABOLIC PANEL  I-STAT BETA HCG BLOOD, ED ([facility], [facility], [facility] ONLY)  I-STAT CHEM 8, ED    EKG None  Radiology No results found.  Procedures Procedures    Medications Ordered in ED Medications  sodium chloride 0.9 % bolus 1,000 mL (has no administration in time range)    ED Course/ Medical Decision Making/ A&P                           Medical Decision Making Amount and/or Complexity of Data Reviewed Labs: ordered. Radiology: ordered.  Risk Prescription drug management. Decision regarding hospitalization.    Medical Screen Complete  This patient presented to the ED with complaint of elevated glucose, concern for DKA.  This complaint involves an extensive number of treatment options. The initial differential diagnosis includes, but is not limited to, DKA, metabolic abnormality, dehydration, AKI, etc.  This presentation is: Acute, Self-Limited, Previously Undiagnosed, Uncertain Prognosis, Complicated, Systemic Symptoms, and Threat to Life/Bodily Function  Patient with known history of type 1 diabetes and CKD presents with concern for possible DKA.  Patient reports possible issues with home insulin administration.  Work-up is suggestive  of DKA with elevated glucose, elevated creatinine, open anion gap.  Patient would benefit from admission for further work-up and treatment.  Hospitalist service is aware of case and will evaluate for admission.   Co morbidities that complicated the patient's evaluation  Diabetes, CKD   Additional history obtained:  External records from outside sources obtained and reviewed including prior ED visits and prior Inpatient records.    Lab Tests:  I ordered and personally interpreted labs.  The pertinent results include: CBC, CMP, hCG, UA, VBG, beta hydroxybutyric acid   Imaging Studies ordered:  I ordered imaging studies including chest x-ray I independently visualized and interpreted obtained imaging which showed NAD I agree with the radiologist interpretation.   Cardiac Monitoring:  The patient was maintained on  a cardiac monitor.  I personally viewed and interpreted the cardiac monitor which showed an underlying rhythm of: Sinus tach   Medicines ordered:  I ordered medication including IV fluids, insulin for DKA Reevaluation of the patient after these medicines showed that the patient: improved   Problem List / ED Course:  DKA, AKI   Reevaluation:  After the interventions noted above, I reevaluated the patient and found that they have: improved   Disposition:  After consideration of the diagnostic results and the patients response to treatment, I feel that the patent would benefit from admission.   CRITICAL CARE Performed by: Valarie Merino   Total critical care time: 30 minutes  Critical care time was exclusive of separately billable procedures and treating other patients.  Critical care was necessary to treat or prevent imminent or life-threatening deterioration.  Critical care was time spent personally by me on the following activities: development of treatment plan with patient and/or surrogate as well as nursing, discussions with consultants,  evaluation of patient's response to treatment, examination of patient, obtaining history from patient or surrogate, ordering and performing treatments and interventions, ordering and review of laboratory studies, ordering and review of radiographic studies, pulse oximetry and re-evaluation of patient's condition.          Final Clinical Impression(s) / ED Diagnoses Final diagnoses:  Diabetic ketoacidosis without coma associated with type 1 diabetes mellitus [facility])    Rx / DC Orders ED Discharge Orders     None         Valarie Merino, MD 11/06/21 [phone]

## 2021-11-07 ENCOUNTER — Other Ambulatory Visit (HOSPITAL_COMMUNITY): Payer: Self-pay

## 2021-11-07 ENCOUNTER — Telehealth (HOSPITAL_COMMUNITY): Payer: Self-pay | Admitting: Pharmacy Technician

## 2021-11-07 DIAGNOSIS — E1010 Type 1 diabetes mellitus with ketoacidosis without coma: Secondary | ICD-10-CM | POA: Diagnosis not present

## 2021-11-07 LAB — GLUCOSE, CAPILLARY
Glucose-Capillary: 124 mg/dL — ABNORMAL HIGH (ref 70–99)
Glucose-Capillary: 124 mg/dL — ABNORMAL HIGH (ref 70–99)
Glucose-Capillary: 130 mg/dL — ABNORMAL HIGH (ref 70–99)
Glucose-Capillary: 131 mg/dL — ABNORMAL HIGH (ref 70–99)
Glucose-Capillary: 131 mg/dL — ABNORMAL HIGH (ref 70–99)
Glucose-Capillary: 132 mg/dL — ABNORMAL HIGH (ref 70–99)
Glucose-Capillary: 132 mg/dL — ABNORMAL HIGH (ref 70–99)
Glucose-Capillary: 135 mg/dL — ABNORMAL HIGH (ref 70–99)
Glucose-Capillary: 152 mg/dL — ABNORMAL HIGH (ref 70–99)
Glucose-Capillary: 156 mg/dL — ABNORMAL HIGH (ref 70–99)
Glucose-Capillary: 164 mg/dL — ABNORMAL HIGH (ref 70–99)
Glucose-Capillary: 166 mg/dL — ABNORMAL HIGH (ref 70–99)
Glucose-Capillary: 185 mg/dL — ABNORMAL HIGH (ref 70–99)

## 2021-11-07 LAB — CBC
HCT: 24.3 % — ABNORMAL LOW (ref 36.0–46.0)
Hemoglobin: 7.8 g/dL — ABNORMAL LOW (ref 12.0–15.0)
MCH: 27.5 pg (ref 26.0–34.0)
MCHC: 32.1 g/dL (ref 30.0–36.0)
MCV: 85.6 fL (ref 80.0–100.0)
Platelets: 336 10*3/uL (ref 150–400)
RBC: 2.84 MIL/uL — ABNORMAL LOW (ref 3.87–5.11)
RDW: 14.4 % (ref 11.5–15.5)
WBC: 23.9 10*3/uL — ABNORMAL HIGH (ref 4.0–10.5)
nRBC: 0 % (ref 0.0–0.2)

## 2021-11-07 LAB — GASTROINTESTINAL PANEL BY PCR, STOOL (REPLACES STOOL CULTURE)

## 2021-11-07 LAB — BASIC METABOLIC PANEL
Anion gap: 9 (ref 5–15)
Anion gap: 11 (ref 5–15)
BUN: 62 mg/dL — ABNORMAL HIGH (ref 6–20)
BUN: 59 mg/dL — ABNORMAL HIGH (ref 6–20)
CO2: 12 mmol/L — ABNORMAL LOW (ref 22–32)
CO2: 10 mmol/L — ABNORMAL LOW (ref 22–32)
Calcium: 7.8 mg/dL — ABNORMAL LOW (ref 8.9–10.3)
Calcium: 7.9 mg/dL — ABNORMAL LOW (ref 8.9–10.3)
Chloride: 115 mmol/L — ABNORMAL HIGH (ref 98–111)
Chloride: 115 mmol/L — ABNORMAL HIGH (ref 98–111)
Creatinine, Ser: 4.95 mg/dL — ABNORMAL HIGH (ref 0.44–1.00)
Creatinine, Ser: 4.92 mg/dL — ABNORMAL HIGH (ref 0.44–1.00)
GFR, Estimated: 11 mL/min — ABNORMAL LOW (ref >60)
GFR, Estimated: 12 mL/min — ABNORMAL LOW (ref >60)
Glucose, Bld: 130 mg/dL — ABNORMAL HIGH (ref 70–99)
Glucose, Bld: 156 mg/dL — ABNORMAL HIGH (ref 70–99)
Potassium: 3.5 mmol/L (ref 3.5–5.1)
Potassium: 3.7 mmol/L (ref 3.5–5.1)
Sodium: 136 mmol/L (ref 135–145)
Sodium: 136 mmol/L (ref 135–145)

## 2021-11-07 LAB — BETA-HYDROXYBUTYRIC ACID: Beta-Hydroxybutyric Acid: 0.21 mmol/L (ref 0.05–0.27)

## 2021-11-07 LAB — CLOSTRIDIUM DIFFICILE BY PCR, REFLEXED: Toxigenic C. Difficile by PCR: POSITIVE — AB

## 2021-11-07 MED ORDER — TRAMADOL HCL 50 MG PO TABS
50.0000 mg | ORAL_TABLET | Freq: Once | ORAL | Status: AC
  Administered 2021-11-07: 50 mg via ORAL
  Filled 2021-11-07: qty 1

## 2021-11-07 MED ORDER — VANCOMYCIN HCL 125 MG PO CAPS
125.0000 mg | ORAL_CAPSULE | Freq: Four times a day (QID) | ORAL | Status: DC
  Administered 2021-11-07 – 2021-11-11 (×17): 125 mg via ORAL
  Filled 2021-11-07 (×20): qty 1

## 2021-11-07 MED ORDER — INSULIN ASPART 100 UNIT/ML IJ SOLN: 0.0000 [IU] | Freq: Every day | INTRAMUSCULAR | Status: DC

## 2021-11-07 MED ORDER — INSULIN GLARGINE-YFGN 100 UNIT/ML ~~LOC~~ SOLN
10.0000 [IU] | Freq: Every day | SUBCUTANEOUS | Status: DC
Start: 2021-11-07 — End: 2021-11-11
  Administered 2021-11-07 – 2021-11-11 (×5): 10 [IU] via SUBCUTANEOUS
  Filled 2021-11-07 (×5): qty 0.1

## 2021-11-07 MED ORDER — OXYCODONE HCL 5 MG PO TABS
5.0000 mg | ORAL_TABLET | ORAL | Status: DC | PRN
  Administered 2021-11-07 – 2021-11-11 (×10): 5 mg via ORAL
  Filled 2021-11-07 (×10): qty 1

## 2021-11-07 MED ORDER — INSULIN ASPART 100 UNIT/ML IJ SOLN
0.0000 [IU] | Freq: Three times a day (TID) | INTRAMUSCULAR | Status: DC
  Administered 2021-11-07: 1 [IU] via SUBCUTANEOUS
  Administered 2021-11-07: 2 [IU] via SUBCUTANEOUS
  Administered 2021-11-08: 3 [IU] via SUBCUTANEOUS
  Administered 2021-11-08: 2 [IU] via SUBCUTANEOUS
  Administered 2021-11-09: 3 [IU] via SUBCUTANEOUS
  Administered 2021-11-09: 7 [IU] via SUBCUTANEOUS
  Administered 2021-11-10 – 2021-11-11 (×2): 2 [IU] via SUBCUTANEOUS
  Administered 2021-11-11: 1 [IU] via SUBCUTANEOUS

## 2021-11-07 MED ORDER — CARVEDILOL 25 MG PO TABS
25.0000 mg | ORAL_TABLET | Freq: Two times a day (BID) | ORAL | Status: DC
  Administered 2021-11-07 – 2021-11-11 (×8): 25 mg via ORAL
  Filled 2021-11-07: qty 2
  Filled 2021-11-07 (×7): qty 1

## 2021-11-07 MED ORDER — AMLODIPINE BESYLATE 10 MG PO TABS
10.0000 mg | ORAL_TABLET | Freq: Every day | ORAL | Status: DC
  Administered 2021-11-07 – 2021-11-11 (×5): 10 mg via ORAL
  Filled 2021-11-07 (×5): qty 1

## 2021-11-07 NOTE — Progress Notes (Signed)
PROGRESS NOTE  Ashley Freeman [place] YPP:[id number] DOB: July 16, 1990 DOA: 11/06/2021 PCP: Pcp, No   LOS: 1 day   Brief Narrative / Interim history: 31 year old female with history of DM, HTN, CKD 4 comes to the hospital with hyperglycemia.  She thinks that her OmniPod was not working, and when she checked her sugars it said high.  She came to the hospital for help.  She also reports a history of C. difficile last year with severe diarrhea.  She has been having intermittent diarrhea for the past few days but not as severe as her prior episode.  Subjective / 24h Interval events: She is doing well this morning.  No chest pain.  No abdominal pain, no nausea or vomiting.  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  She does not have an anion gap this morning, CBGs seem to be better controlled, stop insulin infusion and start subcutaneous.  Transfer out of stepdown.  Clinically improved, will allow carb modified diet  Lab Results  Component Value Date   HGBA1C 8.0 (H) 07/26/2021   CBG (last 3)  Recent Labs    11/07/21 0741 11/07/21 0907 11/07/21 1020  GLUCAP 166* 156* 152*   Active problems C. difficile infection-her diarrhea is not as severe as it was last year, however she has significant leukocytosis which has not improved despite fluid resuscitation and IV insulin.  Given that she still has loose bowel movements and is still positive she has been placed on p.o. vancomycin  Acute kidney injury on chronic kidney disease stage IV-she has an appointment with nephrology as an  outpatient.  Baseline creatinine is in the 2.9-3.6, currently at 4.9.  Continue IV fluids.  May be dehydrated from her diarrhea  Essential hypertension-resume amlodipine, carvedilol this morning  Normocytic anemia-in the setting of underlying renal disease  Elevated istat hCG- denies possibility of her being pregnant. HGC quant is negative   Pseudohyponatremia-normalized with fluids   Scheduled Meds:  amLODipine  10 mg Oral Daily   carvedilol  25 mg Oral BID WC   Chlorhexidine Gluconate Cloth  6 each Topical Daily   insulin aspart  0-5 Units Subcutaneous QHS   insulin aspart  0-9 Units Subcutaneous TID WC   insulin glargine-yfgn  10 Units Subcutaneous Daily   vancomycin  125 mg Oral QID   Continuous Infusions:  lactated ringers Stopped (11/06/21 2041)   PRN Meds:.dextrose, metoprolol tartrate, mouth rinse, oxyCODONE  Diet Orders (From admission, onward)     Start     Ordered   11/07/21 0743  Diet Carb Modified Fluid consistency: Thin; Room service appropriate? Yes  Diet effective now       Question Answer Comment  Diet-HS Snack? Nothing   Calorie Level Medium 1600-2000   Fluid consistency: Thin   Room service appropriate? Yes      11/07/21 0743            DVT prophylaxis: SCDs Start: 11/06/21 1431   Lab Results  Component Value Date   PLT 336 11/07/2021      Code Status: Full Code  Family Communication: no family  at bedside  Status is: Inpatient  Remains inpatient appropriate because: AKI  Level of care: Med-Surg  Consultants:  none  Objective: Vitals:   11/07/21 0430 11/07/21 0600 11/07/21 0800 11/07/21 0951  BP: (!) 166/99 (!) 169/93  (!) 170/107  Pulse: (!) 104 95    Resp: (!) 24 16    Temp:   97.8 F (36.6 C)   TempSrc:   Oral   SpO2: 100% 98%    Weight:      Height:        Intake/Output Summary (Last 24 hours) at 11/07/2021 1121 Last data filed at 11/07/2021 0518 Gross per 24 hour  Intake 2327.22 ml  Output --  Net 2327.22 ml   Wt  Readings from Last 3 Encounters:  11/06/21 55.5 kg  08/14/21 56.6 kg  07/26/21 53.5 kg    Examination:  Constitutional: NAD Eyes: no scleral icterus ENMT: Mucous membranes are moist.  Neck: normal, supple Respiratory: clear to auscultation bilaterally, no wheezing, no crackles. Normal respiratory effort. No accessory muscle use.  Cardiovascular: Regular rate and rhythm, no murmurs / rubs / gallops. No LE edema.  Abdomen: non distended, no tenderness. Bowel sounds positive.  Musculoskeletal: no clubbing / cyanosis.  Skin: no rashes Neurologic: non focal    Data Reviewed: I have independently reviewed following labs and imaging studies   CBC Recent Labs  Lab 11/06/21 0758 11/07/21 0737  WBC 23.8* 23.9*  HGB 8.1* 7.8*  HCT 26.4* 24.3*  PLT 320 336  MCV 90.4 85.6  MCH 27.7 27.5  MCHC 30.7 32.1  RDW 15.2 14.4  LYMPHSABS 2.4  --   MONOABS 1.2*  --   EOSABS 0.0  --   BASOSABS 0.1  --     Recent Labs  Lab 11/06/21 0758 11/06/21 1457 11/06/21 1734 11/06/21 2220 11/07/21 0158 11/07/21 0737  NA 129* 134* 135 138 136 136  K 5.4* 4.1 3.8 3.7 3.5 3.7  CL 103 111 113* 116* 115* 115*  CO2 <7* <7* 9* 12* 12* 10*  GLUCOSE 871* 605* 429* 142* 130* 156*  BUN 69* 67* 65* 63* 62* 59*  CREATININE 5.54* 5.33* 5.24* 4.84* 4.95* 4.92*  CALCIUM 8.3* 8.2* 8.0* 8.0* 7.8* 7.9*  AST 19  --   --   --   --   --   ALT 18  --   --   --   --   --   ALKPHOS 84  --   --   --   --   --   BILITOT 1.2  --   --   --   --   --   ALBUMIN 2.2*  --   --   --   --   --     ------------------------------------------------------------------------------------------------------------------ No results for input(s): "CHOL", "HDL", "LDLCALC", "TRIG", "CHOLHDL", "LDLDIRECT" in the last 72 hours.  Lab Results  Component Value Date   HGBA1C 8.0 (H) 07/26/2021   ------------------------------------------------------------------------------------------------------------------ No results for input(s):  "TSH", "T4TOTAL", "T3FREE", "THYROIDAB" in the last 72 hours.  Invalid input(s): "FREET3"  Cardiac Enzymes No results for input(s): "CKMB", "TROPONINI", "MYOGLOBIN" in the last 168 hours.  Invalid input(s): "CK" ------------------------------------------------------------------------------------------------------------------    Component Value Date/Time   BNP 963.0 (H) 04/13/2021 1133    CBG: Recent Labs  Lab 11/07/21 0542 11/07/21 0650 11/07/21 0741 11/07/21 0907 11/07/21 1020  GLUCAP 135* 132* 166* 156* 152*    Recent Results (from the past 240 hour(s))  Gastrointestinal Panel by PCR , Stool  Status: None   Collection Time: 11/06/21  6:10 PM   Specimen: STOOL  Result Value Ref Range Status   Campylobacter species NOT DETECTED NOT DETECTED Final   Plesimonas shigelloides NOT DETECTED NOT DETECTED Final   Salmonella species NOT DETECTED NOT DETECTED Final   Yersinia enterocolitica NOT DETECTED NOT DETECTED Final   Vibrio species NOT DETECTED NOT DETECTED Final   Vibrio cholerae NOT DETECTED NOT DETECTED Final   Enteroaggregative E coli (EAEC) NOT DETECTED NOT DETECTED Final   Enteropathogenic E coli (EPEC) NOT DETECTED NOT DETECTED Final   Enterotoxigenic E coli (ETEC) NOT DETECTED NOT DETECTED Final   Shiga like toxin producing E coli (STEC) NOT DETECTED NOT DETECTED Final   E. coli O157 NOT DETECTED NOT DETECTED Final   Shigella/Enteroinvasive E coli (EIEC) NOT DETECTED NOT DETECTED Final   Cryptosporidium NOT DETECTED NOT DETECTED Final   Cyclospora cayetanensis NOT DETECTED NOT DETECTED Final   Entamoeba histolytica NOT DETECTED NOT DETECTED Final   Giardia lamblia NOT DETECTED NOT DETECTED Final   Adenovirus F40/41 NOT DETECTED NOT DETECTED Final   Astrovirus NOT DETECTED NOT DETECTED Final   Norovirus GI/GII NOT DETECTED NOT DETECTED Final   Rotavirus A NOT DETECTED NOT DETECTED Final   Sapovirus (I, II, IV, and V) NOT DETECTED NOT DETECTED Final     Comment: Performed at [facility], [address] [place], [place] [address]  C Difficile Quick Screen w PCR reflex     Status: Abnormal   Collection Time: 11/06/21  6:10 PM   Specimen: STOOL  Result Value Ref Range Status   C Diff antigen POSITIVE (A) NEGATIVE Final   C Diff toxin NEGATIVE NEGATIVE Final   C Diff interpretation Results are indeterminate. See PCR results.  Final    Comment: Performed at [facility], [place] [address] [place], [place] [address]  C. Diff by PCR, Reflexed     Status: Abnormal   Collection Time: 11/06/21  6:10 PM  Result Value Ref Range Status   Toxigenic C. Difficile by PCR POSITIVE (A) NEGATIVE Final    Comment: Positive for toxigenic C. difficile with little to no toxin production. Only treat if clinical presentation suggests symptomatic illness. Performed at [facility], [place] [address] [place], [place] [address]   MRSA Next Gen by PCR, Nasal     Status: None   Collection Time: 11/06/21  7:55 PM   Specimen: Nasal Mucosa; Nasal Swab  Result Value Ref Range Status   MRSA by PCR Next Gen NOT DETECTED NOT DETECTED Final    Comment: (NOTE) The GeneXpert MRSA Assay (FDA approved for NASAL specimens only), is one component of a comprehensive MRSA colonization surveillance program. It is not intended to diagnose MRSA infection nor to guide or monitor treatment for MRSA infections. Test performance is not FDA approved in patients less than 13 years old. Performed at [facility], [place] [address], [place], [place] [address]      Radiology Studies: US RENAL  Result Date: 11/06/2021 CLINICAL DATA:  AKI EXAM: RENAL / URINARY TRACT ULTRASOUND COMPLETE COMPARISON:  None Available. FINDINGS: Right Kidney: Renal measurements: 10.8 x 5.1 x 6.4 cm = volume: 183 mL. Increased parenchymal echogenicity. No mass or hydronephrosis visualized. Left Kidney: Renal measurements: 9.7 x 5.8 x 5.8 cm = volume: 171 mL.  Increased parenchymal echogenicity. No mass or hydronephrosis visualized. Bladder: Appears normal for degree of bladder distention. Other: Trace perisplenic ascites. IMPRESSION: 1. No hydronephrosis. 2.  Increased parenchymal echogenicity, findings can be seen in the setting of medical renal disease. 3. Trace perisplenic ascites. Electronically Signed   By: Yetta Glassman M.D.   On: 11/06/2021 15:47     Marzetta Board, MD, PhD [facility]  Between 7 am - 7 pm I am available, please contact me via Amion (for emergencies) or Securechat (non urgent messages)  Between 7 pm - 7 am I am not available, please contact night coverage MD/APP via Amion

## 2021-11-07 NOTE — Inpatient Diabetes Management (Addendum)
Inpatient Diabetes Program Recommendations  AACE/ADA: New Consensus Statement on Inpatient Glycemic Control (2015)  Target Ranges:  Prepandial:   less than 140 mg/dL      Peak postprandial:   less than 180 mg/dL (1-2 hours)      Critically ill patients:  140 - 180 mg/dL   Lab Results  Component Value Date   GLUCAP 156 (H) 11/07/2021   HGBA1C 8.0 (H) 07/26/2021    Review of Glycemic Control  Latest Reference Range & Units 11/07/21 05:42 11/07/21 06:50 11/07/21 07:41 11/07/21 09:07  Glucose-Capillary 70 - 99 mg/dL 135 (H) 132 (H) 166 (H) 156 (H)  (H): Data is abnormally high Diabetes history: Type 1 DM Outpatient Diabetes medications: Omnipod? Current orders for Inpatient glycemic control: Semglee 10 units QD, Novolog 0-9 units & HS  Inpatient Diabetes Program Recommendations:    Consider adding A1C as previous from 06/2021.   Addendum: Spoke with patient regarding admission for DKA. Patient admits to mistakenly picking up expired Omnipod's and empty back up Novolog vials when packing for travel. Verified they do not connect with updated Dexcom sensors.  Patient states that she went to [facility] and was denied insulin. Is followed by Dr Kelton Pillar, outpatient endocrinology and plans to make a new appointment. Outpatient endocrinology has ordered additional supply because she was running out too soon.  Inquired about mail order pharmacy and patient plans to check into this and change pharmacies.  Reviewed patient's previous A1c of 8.0% although, discussed that it may be more elevated at this time. Explained what a A1c is and what it measures. Also reviewed goal A1c with patient, importance of good glucose control @ home, and blood sugar goals. Reviewed DKA, need for back up plan, 70/30 Relion insulin, questions to ask endocrinology, importance of disposing of expired/empty/unusable equipment and insulin and long term risks without insulin.  Patient has supplies and insulin. No further  questions.     Thanks, Bronson Curb, MSN, RNC-OB Diabetes Coordinator [phone] (8a-5p)

## 2021-11-07 NOTE — Telephone Encounter (Signed)
Pharmacy Patient Advocate Encounter  Insurance verification completed.    The patient is insured through [place] [employer]   The patient is currently admitted and ran test claims for the following: vancomycin 125 mg capsules.  Copays and coinsurance results were relayed to Inpatient clinical team.

## 2021-11-07 NOTE — Progress Notes (Signed)
Pt c/o headache to posterior head, radiating down back of neck - throughout the night.  Stated tylenol didn't work, and that was all she tried at home.  Addressed with Gershon Cull NP.  Fioracet given with some effect.  New order obtained for Ultram - given with + effect.  After administration of Ultram, pt reports that she had been taking her Aunts prescription pain medicine prior to coming to the emergency room.  Gershon Cull NP and Charge RN notified.

## 2021-11-07 NOTE — TOC Benefit Eligibility Note (Signed)
Patient Teacher, English as a foreign language completed.    The patient is currently admitted and upon discharge could be taking vancomycin 125 mg capsule.  The current 10 day co-pay is, $4.00.   The patient is insured through [place] Patient Advocate Specialist [facility] Patient Advocate Team Direct Number: [phone]  Fax: [phone]

## 2021-11-08 DIAGNOSIS — E1010 Type 1 diabetes mellitus with ketoacidosis without coma: Secondary | ICD-10-CM | POA: Diagnosis not present

## 2021-11-08 LAB — CBC
HCT: 24.4 % — ABNORMAL LOW (ref 36.0–46.0)
Hemoglobin: 7.7 g/dL — ABNORMAL LOW (ref 12.0–15.0)
MCH: 27.1 pg (ref 26.0–34.0)
MCHC: 31.6 g/dL (ref 30.0–36.0)
MCV: 85.9 fL (ref 80.0–100.0)
Platelets: 326 10*3/uL (ref 150–400)
RBC: 2.84 MIL/uL — ABNORMAL LOW (ref 3.87–5.11)
RDW: 14.9 % (ref 11.5–15.5)
WBC: 16.3 10*3/uL — ABNORMAL HIGH (ref 4.0–10.5)
nRBC: 0 % (ref 0.0–0.2)

## 2021-11-08 LAB — COMPREHENSIVE METABOLIC PANEL
ALT: 14 U/L (ref 0–44)
AST: 15 U/L (ref 15–41)
Albumin: 1.7 g/dL — ABNORMAL LOW (ref 3.5–5.0)
Alkaline Phosphatase: 73 U/L (ref 38–126)
Anion gap: 12 (ref 5–15)
BUN: 55 mg/dL — ABNORMAL HIGH (ref 6–20)
CO2: 11 mmol/L — ABNORMAL LOW (ref 22–32)
Calcium: 7.5 mg/dL — ABNORMAL LOW (ref 8.9–10.3)
Chloride: 113 mmol/L — ABNORMAL HIGH (ref 98–111)
Creatinine, Ser: 4.61 mg/dL — ABNORMAL HIGH (ref 0.44–1.00)
GFR, Estimated: 12 mL/min — ABNORMAL LOW (ref >60)
Glucose, Bld: 220 mg/dL — ABNORMAL HIGH (ref 70–99)
Potassium: 3.5 mmol/L (ref 3.5–5.1)
Sodium: 136 mmol/L (ref 135–145)
Total Bilirubin: 0.4 mg/dL (ref 0.3–1.2)
Total Protein: 5.3 g/dL — ABNORMAL LOW (ref 6.5–8.1)

## 2021-11-08 LAB — GLUCOSE, CAPILLARY
Glucose-Capillary: 202 mg/dL — ABNORMAL HIGH (ref 70–99)
Glucose-Capillary: 178 mg/dL — ABNORMAL HIGH (ref 70–99)
Glucose-Capillary: 116 mg/dL — ABNORMAL HIGH (ref 70–99)
Glucose-Capillary: 122 mg/dL — ABNORMAL HIGH (ref 70–99)

## 2021-11-08 LAB — MAGNESIUM: Magnesium: 1.4 mg/dL — ABNORMAL LOW (ref 1.7–2.4)

## 2021-11-08 MED ORDER — RISAQUAD PO CAPS
2.0000 | ORAL_CAPSULE | Freq: Three times a day (TID) | ORAL | Status: DC
  Administered 2021-11-08 – 2021-11-11 (×9): 2 via ORAL
  Filled 2021-11-08 (×9): qty 2

## 2021-11-08 MED ORDER — SODIUM CHLORIDE 0.45 % IV SOLN
INTRAVENOUS | Status: DC
  Filled 2021-11-08 (×11): qty 75

## 2021-11-08 MED ORDER — BACITRACIN-NEOMYCIN-POLYMYXIN OINTMENT TUBE
TOPICAL_OINTMENT | Freq: Three times a day (TID) | CUTANEOUS | Status: DC
  Administered 2021-11-08: 1 via TOPICAL
  Filled 2021-11-08: qty 14.17

## 2021-11-08 MED ORDER — INSULIN ASPART 100 UNIT/ML IJ SOLN
3.0000 [IU] | Freq: Three times a day (TID) | INTRAMUSCULAR | Status: DC
  Administered 2021-11-08 – 2021-11-11 (×9): 3 [IU] via SUBCUTANEOUS

## 2021-11-08 NOTE — Progress Notes (Signed)
PROGRESS NOTE  Ashley Freeman  DOB: Aug 23, 1990  PCP: Kathyrn Lass GLO:[id number]  DOA: 11/06/2021  LOS: 2 days  Hospital Day: 3  Brief narrative: Ashley Freeman is a 31 y.o. female with PMH significant for DM1, HTN, CKD 4, history of DKA, history of severe diarrhea with C. difficile last year. Patient presented to the ED on 7/10 with complaint of hyperglycemia which she thinks is related to OmniPod malfunction.  She also reported intermittent diarrhea for few days.  Initial labs with glucose elevated over 600, blood gas with pH low at 7.07, PCO2 less than 18, bicarb low at 2.3. Potassium 5.4, BUN/creatinine 69/5.5, WBC count elevated to 23.8, hemoglobin at 8.1 Admitted to hospital service with severe DKA. See below for details  Subjective: Patient was seen and examined this afternoon.  Pleasant young African-American female.  Lying on bed.  Not in distress.  Complains of significant pain in the top of the head where she has a scabbed lesion.  Reports radiating pain to the back of the neck.  Assessment and plan: Severe DKA  -Presumably because of OmniPod malfunction -Initial labs with significantly low PH, bicarbonate level, elevated blood glucose level -Started on DKA protocol with IV insulin drip, IV fluid, electrolyte monitoring -Subsequently switched to subcutaneous insulin.  Uncontrolled type 2 diabetes mellitus -A1c 8 on 07/26/2021.  Repeat A1c -PTA on insulin pump. -Initially he was placed on insulin infusion for DKA.  Subsequently switched to subcutaneous insulin.  -Currently on Semglee 10 units daily along with sliding scale insulin. -Blood sugar level this morning elevated to 202.  Discussed with diabetes care coordinator.  Added NovoLog 3 units 3 times daily prior to meal.  To resume insulin pump at discharge.  Lab Results  Component Value Date   HGBA1C 8.0 (H) 07/26/2021   Recent Labs  Lab 11/07/21 1310 11/07/21 1649 11/07/21 2112  11/08/21 0715 11/08/21 1125  GLUCAP 185* 131* 164* 202* 178*    C. difficile infection -Patient reported history of severe C. difficile last year.  She reported intermittent diarrhea and this presentation.   -C. difficile assay with positive antigen but negative toxin.  Previous hospitalist made a decision to treat based on her symptoms and risk of rapid deterioration.   -Currently on 10-day course of oral vancomycin.     AKI on CKD 4 Acute metabolic acidosis -Baseline creatinine close to 3.  Presented with creatinine elevated to 5.54 in the setting of DKA.   -Gradually improving but still not close to baseline.  4.6 on today.  Continue IV hydration.  -Significantly low bicarb level at presentation due to DKA.  Creatinine improving but still remains low.  Bicarbonate at 11 today.  Continue bicarb drip. -Has an appointment with nephrology as an outpatient. Recent Labs    07/27/21 0245 07/27/21 0554 07/27/21 0952 11/06/21 0758 11/06/21 1457 11/06/21 1734 11/06/21 2220 11/07/21 0158 11/07/21 0737 11/08/21 0707  BUN 50* 49* 47* 69* 67* 65* 63* 62* 59* 55*  CREATININE 3.17* 3.13* 2.89* 5.54* 5.33* 5.24* 4.84* 4.95* 4.92* 4.61*  CO2 13* 12* 13* <7* <7* 9* 12* 12* 10* 11*      Essential hypertension -Continue Coreg, amlodipine   Anemia chronic disease -Baseline hemoglobin between 8 and 9.  Remains close to baseline.  No active bleeding.  Continue to monitor. Recent Labs    03/21/21 0424 03/21/21 1516 03/22/21 0210 03/22/21 0231 03/23/21 0257 03/24/21 1884 03/31/21 1018 04/09/21 0741 04/10/21 2222 06/20/21 0942 07/26/21 1315 11/06/21 0758 11/06/21 1457 11/07/21  2130 11/08/21 0707  HGB 6.8*   < >  --    < > 7.8* 8.6*   < > 7.1*   < > 8.6* 9.5* 8.1*  --  7.8* 7.7*  MCV 82.7  --   --    < > 76.6* 79.8*   < >  --    < > 88.7 89.6 90.4  --  85.6 85.9  VITAMINB12  --   --   --   --   --   --   --  935*  --   --   --   --   --   --   --   FOLATE  --   --   --   --   --   --    --  6.5  --   --   --   --   --   --   --   FERRITIN 308*  --  599*  --  536* 487*  --  340*  --   --   --   --   --   --   --   TIBC  --   --   --   --   --   --   --  123*  --   --   --   --  153*  --   --   IRON  --   --   --   --   --   --   --  45  --   --   --   --  24*  --   --   RETICCTPCT  --   --   --   --   --   --   --  2.4  --   --   --   --   --   --   --    < > = values in this interval not displayed.   Elevated istat hCG -Initial labs with hCG elevated to 51.6.  Patient denies possibility of her being pregnant. Person is negative   Scalp lesion Seems to be a quarter sized crusted lesion.  I will start her on topical Neosporin as well as probiotics.  Oxycodone as needed for pain control.  Goals of care   Code Status: Full Code    Mobility: Encourage ambulation  Skin assessment:     Nutritional status:  Body mass index is 21.67 kg/m.          Diet:  Diet Order             Diet Carb Modified Fluid consistency: Thin; Room service appropriate? Yes  Diet effective now                   DVT prophylaxis:  SCDs Start: 11/06/21 1431   Antimicrobials: Topical Neosporin, oral vancomycin Fluid: Sodium bicarb at 125 mill per hour Consultants: None Family Communication: None at bedside  Status is: Inpatient  Continue in-hospital care because: Needs IV fluid for AKI Level of care: Med-Surg   Dispo: The patient is from: Home              Anticipated d/c is to: Home in 2 to 3 days              Patient currently is not medically stable to d/c.   Difficult to place patient No     Infusions:   sodium bicarbonate 75  mEq in sodium chloride 0.45 % 1,075 mL infusion 125 mL/hr at 11/08/21 1139    Scheduled Meds:  acidophilus  2 capsule Oral TID   amLODipine  10 mg Oral Daily   carvedilol  25 mg Oral BID WC   Chlorhexidine Gluconate Cloth  6 each Topical Daily   insulin aspart  0-5 Units Subcutaneous QHS   insulin aspart  0-9 Units Subcutaneous TID  WC   insulin aspart  3 Units Subcutaneous TID WC   insulin glargine-yfgn  10 Units Subcutaneous Daily   neomycin-bacitracin-polymyxin   Topical TID   vancomycin  125 mg Oral QID    PRN meds: dextrose, metoprolol tartrate, mouth rinse, oxyCODONE   Antimicrobials: Anti-infectives (From admission, onward)    Start     Dose/Rate Route Frequency Ordered Stop   11/07/21 1000  vancomycin (VANCOCIN) capsule 125 mg        125 mg Oral 4 times daily 11/07/21 0805 11/17/21 0959       Objective: Vitals:   11/08/21 0529 11/08/21 1413  BP: 123/78 138/83  Pulse: 92 95  Resp: 16 17  Temp: 98.4 F (36.9 C) 98.2 F (36.8 C)  SpO2: 99% 100%    Intake/Output Summary (Last 24 hours) at 11/08/2021 1523 Last data filed at 11/08/2021 0942 Gross per 24 hour  Intake 240 ml  Output --  Net 240 ml   Filed Weights   11/06/21 0930 11/06/21 1223  Weight: 57 kg 55.5 kg   Weight change:  Body mass index is 21.67 kg/m.   Physical Exam: General exam: Pleasant, young African-American female. Skin: No rashes, lesions or ulcers. HEENT: Crusted scalp lesion posteriorly on the right Lungs: Clear to auscultation bilaterally CVS: Regular rate and rhythm, no murmur GI/Abd soft, nontender, nondistended, bowel sound present CNS: Alert, awake, oriented x3 Psychiatry: Mood appropriate Extremities: No pedal edema, no calf tenderness  Data Review: I have personally reviewed the laboratory data and studies available.  F/u labs  Unresulted Labs (From admission, onward)     Start     Ordered   11/09/21 0102  Basic metabolic panel  Daily at 5am,   R     Question:  Specimen collection method  Answer:  Lab=Lab collect   11/08/21 0947   11/09/21 0500  CBC with Differential/Platelet  Daily at 5am,   R     Question:  Specimen collection method  Answer:  Lab=Lab collect   11/08/21 0947   11/09/21 0500  Magnesium  Tomorrow morning,   R       Question:  Specimen collection method  Answer:  Lab=Lab collect    11/08/21 0947   11/09/21 0500  Phosphorus  Tomorrow morning,   R       Question:  Specimen collection method  Answer:  Lab=Lab collect   11/08/21 0947   11/09/21 0500  Hemoglobin A1c  Tomorrow morning,   R       Question:  Specimen collection method  Answer:  Lab=Lab collect   11/08/21 0947            Signed, Terrilee Croak, MD [facility] 11/08/2021

## 2021-11-08 NOTE — Inpatient Diabetes Management (Addendum)
Inpatient Diabetes Program Recommendations  AACE/ADA: New Consensus Statement on Inpatient Glycemic Control (2015)  Target Ranges:  Prepandial:   less than 140 mg/dL      Peak postprandial:   less than 180 mg/dL (1-2 hours)      Critically ill patients:  140 - 180 mg/dL   Lab Results  Component Value Date   GLUCAP 202 (H) 11/08/2021   HGBA1C 8.0 (H) 07/26/2021    Review of Glycemic Control  Latest Reference Range & Units 11/07/21 10:20 11/07/21 13:10 11/07/21 16:49 11/07/21 21:12 11/08/21 07:15  Glucose-Capillary 70 - 99 mg/dL 152 (H) 185 (H) 131 (H) 164 (H) 202 (H)  (H): Data is abnormally high Diabetes history: Type 1 DM Outpatient Diabetes medications: Omnipod? Current orders for Inpatient glycemic control: Semglee 10 units QD, Novolog 0-9 units & HS   Inpatient Diabetes Program Recommendations:    Consider adding Novolog 3 units TID (assuming patient is consuming >50% of meals).  Spoke with patient regarding insulin pump application. Does not have supplies with her while inpatient and no one will be visiting to bring her supplies. However, has everything that she needs to reapply immediately following discharge. Education provided on reapplication following inpatient basal dose. Secure chat sent to MD.  Thanks, Bronson Curb, MSN, RNC-OB Diabetes Coordinator [phone] (8a-5p)

## 2021-11-08 NOTE — Progress Notes (Signed)
A nickel size raised crusted area noted to patient's left scalp. Patient c/o 10/10 pain starting from affected area down to her neck. Patient was given PRN Oxy this AM which helped. Patient stated that she has had this area for about a week and mentioning it for the first time today. Attending made aware and will assess during rounds. Patient made aware.

## 2021-11-09 DIAGNOSIS — E1010 Type 1 diabetes mellitus with ketoacidosis without coma: Secondary | ICD-10-CM | POA: Diagnosis not present

## 2021-11-09 LAB — GLUCOSE, CAPILLARY
Glucose-Capillary: 311 mg/dL — ABNORMAL HIGH (ref 70–99)
Glucose-Capillary: 207 mg/dL — ABNORMAL HIGH (ref 70–99)
Glucose-Capillary: 46 mg/dL — ABNORMAL LOW (ref 70–99)
Glucose-Capillary: 149 mg/dL — ABNORMAL HIGH (ref 70–99)
Glucose-Capillary: 144 mg/dL — ABNORMAL HIGH (ref 70–99)

## 2021-11-09 LAB — CBC WITH DIFFERENTIAL/PLATELET
Abs Immature Granulocytes: 0.34 10*3/uL — ABNORMAL HIGH (ref 0.00–0.07)
Basophils Absolute: 0 10*3/uL (ref 0.0–0.1)
Basophils Relative: 0 %
Eosinophils Absolute: 0.1 10*3/uL (ref 0.0–0.5)
Eosinophils Relative: 1 %
HCT: 23.6 % — ABNORMAL LOW (ref 36.0–46.0)
Hemoglobin: 7.3 g/dL — ABNORMAL LOW (ref 12.0–15.0)
Immature Granulocytes: 2 %
Lymphocytes Relative: 14 %
Lymphs Abs: 2.2 10*3/uL (ref 0.7–4.0)
MCH: 26.5 pg (ref 26.0–34.0)
MCHC: 30.9 g/dL (ref 30.0–36.0)
MCV: 85.8 fL (ref 80.0–100.0)
Monocytes Absolute: 1.6 10*3/uL — ABNORMAL HIGH (ref 0.1–1.0)
Monocytes Relative: 10 %
Neutro Abs: 11.6 10*3/uL — ABNORMAL HIGH (ref 1.7–7.7)
Neutrophils Relative %: 73 %
Platelets: 269 10*3/uL (ref 150–400)
RBC: 2.75 MIL/uL — ABNORMAL LOW (ref 3.87–5.11)
RDW: 14.8 % (ref 11.5–15.5)
WBC: 15.8 10*3/uL — ABNORMAL HIGH (ref 4.0–10.5)
nRBC: 0 % (ref 0.0–0.2)

## 2021-11-09 LAB — BASIC METABOLIC PANEL
Anion gap: 10 (ref 5–15)
BUN: 49 mg/dL — ABNORMAL HIGH (ref 6–20)
CO2: 15 mmol/L — ABNORMAL LOW (ref 22–32)
Calcium: 7.2 mg/dL — ABNORMAL LOW (ref 8.9–10.3)
Chloride: 113 mmol/L — ABNORMAL HIGH (ref 98–111)
Creatinine, Ser: 4.13 mg/dL — ABNORMAL HIGH (ref 0.44–1.00)
GFR, Estimated: 14 mL/min — ABNORMAL LOW (ref >60)
Glucose, Bld: 280 mg/dL — ABNORMAL HIGH (ref 70–99)
Potassium: 3.5 mmol/L (ref 3.5–5.1)
Sodium: 138 mmol/L (ref 135–145)

## 2021-11-09 LAB — MAGNESIUM: Magnesium: 1.4 mg/dL — ABNORMAL LOW (ref 1.7–2.4)

## 2021-11-09 LAB — HEMOGLOBIN A1C
Hgb A1c MFr Bld: 9.1 % — ABNORMAL HIGH (ref 4.8–5.6)
Mean Plasma Glucose: 214.47 mg/dL

## 2021-11-09 LAB — PHOSPHORUS: Phosphorus: 5.3 mg/dL — ABNORMAL HIGH (ref 2.5–4.6)

## 2021-11-09 MED ORDER — CYCLOBENZAPRINE HCL 5 MG PO TABS
5.0000 mg | ORAL_TABLET | Freq: Two times a day (BID) | ORAL | Status: DC
  Administered 2021-11-09 – 2021-11-11 (×5): 5 mg via ORAL
  Filled 2021-11-09 (×5): qty 1

## 2021-11-09 MED ORDER — CYCLOBENZAPRINE HCL 5 MG PO TABS: 5.0000 mg | ORAL_TABLET | Freq: Three times a day (TID) | ORAL | Status: DC

## 2021-11-09 NOTE — Inpatient Diabetes Management (Signed)
Inpatient Diabetes Program Recommendations  AACE/ADA: New Consensus Statement on Inpatient Glycemic Control (2015)  Target Ranges:  Prepandial:   less than 140 mg/dL      Peak postprandial:   less than 180 mg/dL (1-2 hours)      Critically ill patients:  140 - 180 mg/dL   Lab Results  Component Value Date   GLUCAP 207 (H) 11/09/2021   HGBA1C 9.1 (H) 11/09/2021    Review of Glycemic Control  Latest Reference Range & Units 11/08/21 16:45 11/08/21 21:44 11/09/21 09:06 11/09/21 12:38  Glucose-Capillary 70 - 99 mg/dL 116 (H) 122 (H) 311 (H) 207 (H)  (H): Data is abnormally high Diabetes history: Type 1 DM Outpatient Diabetes medications: Omnipod? Current orders for Inpatient glycemic control: Semglee 10 units QD, Novolog 0-9 units & HS, Novolog 3 units TID   Inpatient Diabetes Program Recommendations:     Curious if FSBG an true fasting. Question if patient ate prior? Attempted to verify with nursing staff.  Consider adding CBG at 0200 if to remain inpatient.   Thanks, Bronson Curb, MSN, RNC-OB Diabetes Coordinator [phone] (8a-5p)

## 2021-11-09 NOTE — Plan of Care (Signed)
  Problem: Health Behavior/Discharge Planning: Goal: Ability to manage health-related needs will improve Outcome: Progressing   

## 2021-11-09 NOTE — Progress Notes (Signed)
PROGRESS NOTE  Ashley Freeman  DOB: 1991/02/06  PCP: Kathyrn Lass OXB:[id number]  DOA: 11/06/2021  LOS: 3 days  Hospital Day: 4  Brief narrative: Ashley Freeman is a 31 y.o. female with PMH significant for DM1, HTN, CKD 4, history of DKA, history of severe diarrhea with C. difficile last year. Patient presented to the ED on 7/10 with complaint of hyperglycemia which she thinks is related to OmniPod malfunction.  She also reported intermittent diarrhea for few days.  Initial labs with glucose elevated over 600, blood gas with pH low at 7.07, PCO2 less than 18, bicarb low at 2.3. Potassium 5.4, BUN/creatinine 69/5.5, WBC count elevated to 23.8, hemoglobin at 8.1 Admitted to hospital service with severe DKA. See below for details  Subjective: Patient was seen and examined this morning. Continues to have pain at the back of the head and neck especially on moving to the sides. Renal function gradually improving.  Assessment and plan: Severe DKA  -Presumably because of OmniPod malfunction -Initial labs with significantly low PH, bicarbonate level, elevated blood glucose level -Started on DKA protocol with IV insulin drip, IV fluid, electrolyte monitoring -Subsequently switched to subcutaneous insulin.  Uncontrolled type 2 diabetes mellitus -A1c was 8 on 07/26/2021.  It seems to have worsened to 9.1 when repeated on 11/09/2021. -PTA on insulin pump. -Initially he was placed on insulin infusion for DKA.  Subsequently switched to subcutaneous insulin.  -Currently on Semglee 10 units daily, NovoLog 3 units 3 times daily along with sliding scale insulin. -To resume insulin pump at discharge. Recent Labs  Lab 11/08/21 1125 11/08/21 1645 11/08/21 2144 11/09/21 0906 11/09/21 1238  GLUCAP 178* 116* 122* 311* 207*    C. difficile infection -Patient reported history of severe C. difficile last year.  She reported intermittent diarrhea and this presentation.   -C.  difficile assay with positive antigen but negative toxin.  Previous hospitalist made a decision to treat based on her symptoms and risk of rapid deterioration.   -Currently on 10-day course of oral vancomycin.     AKI on CKD 4 Acute metabolic acidosis -Baseline creatinine close to 3.  Presented with creatinine elevated to 5.54 in the setting of DKA.   -Gradually improving but still not close to baseline.  4.13 today.  Continue IV hydration.  -Significantly low bicarb level at presentation due to DKA.  Gradually improving on bicarbonate drip, it is 15 today.  Continue bicarb drip. -Has an appointment with nephrology as an outpatient. Recent Labs    07/27/21 0554 07/27/21 0952 11/06/21 0758 11/06/21 1457 11/06/21 1734 11/06/21 2220 11/07/21 0158 11/07/21 0737 11/08/21 0707 11/09/21 0619  BUN 49* 47* 69* 67* 65* 63* 62* 59* 55* 49*  CREATININE 3.13* 2.89* 5.54* 5.33* 5.24* 4.84* 4.95* 4.92* 4.61* 4.13*  CO2 12* 13* <7* <7* 9* 12* 12* 10* 11* 15*      Essential hypertension -Continue Coreg, amlodipine   Anemia chronic disease -Baseline hemoglobin between 8 and 9.  Slightly downtrending, probably dilutional.   No active bleeding.  Continue to monitor. Recent Labs    03/21/21 0424 03/21/21 1516 03/22/21 0210 03/22/21 0231 03/23/21 0257 03/24/21 0918 03/31/21 1018 04/09/21 0741 04/10/21 2222 07/26/21 1315 11/06/21 0758 11/06/21 1457 11/07/21 0737 11/08/21 0707 11/09/21 0619  HGB 6.8*   < >  --    < > 7.8* 8.6*   < > 7.1*   < > 9.5* 8.1*  --  7.8* 7.7* 7.3*  MCV 82.7  --   --    < >  76.6* 79.8*   < >  --    < > 89.6 90.4  --  85.6 85.9 85.8  VITAMINB12  --   --   --   --   --   --   --  935*  --   --   --   --   --   --   --   FOLATE  --   --   --   --   --   --   --  6.5  --   --   --   --   --   --   --   FERRITIN 308*  --  599*  --  536* 487*  --  340*  --   --   --   --   --   --   --   TIBC  --   --   --   --   --   --   --  123*  --   --   --  153*  --   --   --    IRON  --   --   --   --   --   --   --  45  --   --   --  24*  --   --   --   RETICCTPCT  --   --   --   --   --   --   --  2.4  --   --   --   --   --   --   --    < > = values in this interval not displayed.   Elevated istat hCG -Initial labs with hCG elevated to 51.6.  Patient denies possibility of her being pregnant. [place] is negative   Scalp lesion Seems to be a quarter sized crusted lesion.  I will start her on topical Neosporin as well as probiotics.  Oxycodone as needed for pain control.  Muscle spasm of neck -Flexeril twice daily  Goals of care   Code Status: Full Code    Mobility: Encourage ambulation  Skin assessment:     Nutritional status:  Body mass index is 21.67 kg/m.          Diet:  Diet Order             Diet Carb Modified Fluid consistency: Thin; Room service appropriate? Yes  Diet effective now                   DVT prophylaxis:  SCDs Start: 11/06/21 1431   Antimicrobials: Topical Neosporin, oral vancomycin Fluid: Sodium bicarb at 125 mill per hour to continue Consultants: None Family Communication: None at bedside  Status is: Inpatient  Continue in-hospital care because: Needs IV fluid for AKI, metabolic acidosis Level of care: Med-Surg   Dispo: The patient is from: Home              Anticipated d/c is to: Home in 2 to 3 days              Patient currently is not medically stable to d/c.   Difficult to place patient No     Infusions:   sodium bicarbonate 75 mEq in sodium chloride 0.45 % 1,075 mL infusion 125 mL/hr at 11/09/21 0728    Scheduled Meds:  acidophilus  2 capsule Oral TID   amLODipine  10 mg Oral Daily   carvedilol  25 mg Oral BID WC   Chlorhexidine Gluconate Cloth  6 each Topical Daily   cyclobenzaprine  5 mg Oral BID   insulin aspart  0-5 Units Subcutaneous QHS   insulin aspart  0-9 Units Subcutaneous TID WC   insulin aspart  3 Units Subcutaneous TID WC   insulin glargine-yfgn  10 Units Subcutaneous  Daily   neomycin-bacitracin-polymyxin   Topical TID   vancomycin  125 mg Oral QID    PRN meds: dextrose, metoprolol tartrate, mouth rinse, oxyCODONE   Antimicrobials: Anti-infectives (From admission, onward)    Start     Dose/Rate Route Frequency Ordered Stop   11/07/21 1000  vancomycin (VANCOCIN) capsule 125 mg        125 mg Oral 4 times daily 11/07/21 0805 11/17/21 0959       Objective: Vitals:   11/08/21 2141 11/09/21 0638  BP: (!) 153/90 (!) 149/98  Pulse: 98 (!) 103  Resp: 17   Temp: 99.7 F (37.6 C) 99.4 F (37.4 C)  SpO2: 98% 98%    Intake/Output Summary (Last 24 hours) at 11/09/2021 1354 Last data filed at 11/09/2021 0908 Gross per 24 hour  Intake 957.98 ml  Output --  Net 957.98 ml   Filed Weights   11/06/21 0930 11/06/21 1223  Weight: 57 kg 55.5 kg   Weight change:  Body mass index is 21.67 kg/m.   Physical Exam: General exam: Pleasant, young African-American female. Skin: No rashes, lesions or ulcers. HEENT: Crusted scalp lesion posteriorly on the right.  Unable to open neck to the left or right because of muscle spasm Lungs: Clear to auscultation bilaterally CVS: Regular rate and rhythm, no murmur GI/Abd soft, nontender, nondistended, bowel sound present CNS: Alert, awake, oriented x3 Psychiatry: Mood appropriate Extremities: No pedal edema, no calf tenderness  Data Review: I have personally reviewed the laboratory data and studies available.  F/u labs  Unresulted Labs (From admission, onward)     Start     Ordered   11/09/21 9021  Basic metabolic panel  Daily at 5am,   R     Question:  Specimen collection method  Answer:  Lab=Lab collect   11/08/21 0947   11/09/21 0500  CBC with Differential/Platelet  Daily at 5am,   R     Question:  Specimen collection method  Answer:  Lab=Lab collect   11/08/21 0947            Signed, Terrilee Croak, MD [facility] 11/09/2021

## 2021-11-10 ENCOUNTER — Inpatient Hospital Stay (HOSPITAL_COMMUNITY): Payer: Medicaid Other

## 2021-11-10 LAB — CBC WITH DIFFERENTIAL/PLATELET
Abs Immature Granulocytes: 0.52 10*3/uL — ABNORMAL HIGH (ref 0.00–0.07)
Basophils Absolute: 0.1 10*3/uL (ref 0.0–0.1)
Basophils Relative: 0 %
Eosinophils Absolute: 0.2 10*3/uL (ref 0.0–0.5)
Eosinophils Relative: 1 %
HCT: 22.7 % — ABNORMAL LOW (ref 36.0–46.0)
Hemoglobin: 7.2 g/dL — ABNORMAL LOW (ref 12.0–15.0)
Immature Granulocytes: 3 %
Lymphocytes Relative: 16 %
Lymphs Abs: 3.3 10*3/uL (ref 0.7–4.0)
MCH: 27.2 pg (ref 26.0–34.0)
MCHC: 31.7 g/dL (ref 30.0–36.0)
MCV: 85.7 fL (ref 80.0–100.0)
Monocytes Absolute: 1.8 10*3/uL — ABNORMAL HIGH (ref 0.1–1.0)
Monocytes Relative: 9 %
Neutro Abs: 14.7 10*3/uL — ABNORMAL HIGH (ref 1.7–7.7)
Neutrophils Relative %: 71 %
Platelets: 289 10*3/uL (ref 150–400)
RBC: 2.65 MIL/uL — ABNORMAL LOW (ref 3.87–5.11)
RDW: 14.6 % (ref 11.5–15.5)
WBC: 20.5 10*3/uL — ABNORMAL HIGH (ref 4.0–10.5)
nRBC: 0 % (ref 0.0–0.2)

## 2021-11-10 LAB — GLUCOSE, CAPILLARY
Glucose-Capillary: 188 mg/dL — ABNORMAL HIGH (ref 70–99)
Glucose-Capillary: 165 mg/dL — ABNORMAL HIGH (ref 70–99)
Glucose-Capillary: 90 mg/dL (ref 70–99)
Glucose-Capillary: 93 mg/dL (ref 70–99)
Glucose-Capillary: 77 mg/dL (ref 70–99)

## 2021-11-10 LAB — BASIC METABOLIC PANEL
Anion gap: 9 (ref 5–15)
BUN: 42 mg/dL — ABNORMAL HIGH (ref 6–20)
CO2: 17 mmol/L — ABNORMAL LOW (ref 22–32)
Calcium: 6.8 mg/dL — ABNORMAL LOW (ref 8.9–10.3)
Chloride: 109 mmol/L (ref 98–111)
Creatinine, Ser: 3.9 mg/dL — ABNORMAL HIGH (ref 0.44–1.00)
GFR, Estimated: 15 mL/min — ABNORMAL LOW (ref >60)
Glucose, Bld: 185 mg/dL — ABNORMAL HIGH (ref 70–99)
Potassium: 2.9 mmol/L — ABNORMAL LOW (ref 3.5–5.1)
Sodium: 135 mmol/L (ref 135–145)

## 2021-11-10 MED ORDER — SODIUM BICARBONATE 650 MG PO TABS
1300.0000 mg | ORAL_TABLET | Freq: Two times a day (BID) | ORAL | Status: DC
  Administered 2021-11-10 – 2021-11-11 (×3): 1300 mg via ORAL
  Filled 2021-11-10 (×3): qty 2

## 2021-11-10 MED ORDER — SODIUM CHLORIDE 0.9 % IV SOLN
1.0000 g | INTRAVENOUS | Status: DC
  Administered 2021-11-10: 1 g via INTRAVENOUS
  Filled 2021-11-10 (×2): qty 10

## 2021-11-10 MED ORDER — POTASSIUM CHLORIDE CRYS ER 20 MEQ PO TBCR
40.0000 meq | EXTENDED_RELEASE_TABLET | ORAL | Status: AC
  Administered 2021-11-10 (×3): 40 meq via ORAL
  Filled 2021-11-10 (×3): qty 2

## 2021-11-10 NOTE — Progress Notes (Signed)
PROGRESS NOTE  Daiana Vitiello Freeman  DOB: 06-15-1990  PCP: Kathyrn Lass PRF:[id number]  DOA: 11/06/2021  LOS: 4 days  Hospital Day: 5  Brief narrative: Ashley Freeman is a 31 y.o. female with PMH significant for DM1, HTN, CKD 4, history of DKA, history of severe diarrhea with C. difficile last year. Patient presented to the ED on 7/10 with complaint of hyperglycemia which she thinks is related to OmniPod malfunction.  She also reported intermittent diarrhea for few days.  Initial labs with glucose elevated over 600, blood gas with pH low at 7.07, PCO2 less than 18, bicarb low at 2.3. Potassium 5.4, BUN/creatinine 69/5.5, WBC count elevated to 23.8, hemoglobin at 8.1 Admitted to hospital service with severe DKA. See below for details  Subjective: Patient was seen and examined this morning. Lying on bed.  Not in distress. Her biggest complaint is continuous pain and firmness on the left postauricular area not controlled with Flexeril. Remains on IV fluid.  Gradually improving renal function.  Assessment and plan: Severe DKA  -Presumably because of OmniPod malfunction -Initial labs with significantly low PH, bicarbonate level, elevated blood glucose level -Started on DKA protocol with IV insulin drip, IV fluid, electrolyte monitoring -Subsequently switched to subcutaneous insulin.  Uncontrolled type 2 diabetes mellitus Hyperglycemia/hypoglycemia -A1c was 8 on 07/26/2021.  It seems to have worsened to 9.1 when repeated on 11/09/2021. -PTA patient was on insulin pump. -Initially he was placed on insulin infusion for DKA.  Subsequently switched to subcutaneous insulin.   -Currently on Semglee 10 units daily, NovoLog 3 units 3 times daily along with sliding scale insulin.  Yesterday afternoon, she had a low blood sugar episode of 46.  Asymptomatic.  Stable blood sugar since then. -Patient does not have her insulin pump supplies here in the hospital.  She will resume insulin  pump post discharge. Recent Labs  Lab 11/09/21 1652 11/09/21 1851 11/09/21 2128 11/10/21 0433 11/10/21 0803  GLUCAP 46* 149* 144* 188* 165*     Focal headache -She has focal pain, tenderness, feeling of fullness in left postauricular area.  Not improving with Flexeril. -Obtain CT scan of head today to rule out underlying pathology.  C. difficile infection -Patient reported history of severe C. difficile last year.  She reported intermittent diarrhea and this presentation.   -C. difficile assay with positive antigen but negative toxin.  Previous hospitalist made a decision to treat based on her symptoms and risk of rapid deterioration.   -Currently on 10-day course of oral vancomycin.     AKI on CKD 4 Acute metabolic acidosis -Baseline creatinine close to 3.  Presented with creatinine elevated to 5.54 in the setting of DKA.   -Gradually improving but still not close to baseline.  3.9 today.  Continue IV hydration.  -Significantly low bicarb level at presentation due to DKA.  Gradually improving on bicarbonate drip, it is 17 today.  Continue bicarb drip.  Also added sodium bicarb tablet today. -She has an appointment with nephrology as an outpatient in August. Recent Labs    07/27/21 0952 11/06/21 0758 11/06/21 1457 11/06/21 1734 11/06/21 2220 11/07/21 0158 11/07/21 0737 11/08/21 0707 11/09/21 0619 11/10/21 0617  BUN 47* 69* 67* 65* 63* 62* 59* 55* 49* 42*  CREATININE 2.89* 5.54* 5.33* 5.24* 4.84* 4.95* 4.92* 4.61* 4.13* 3.90*  CO2 13* <7* <7* 9* 12* 12* 10* 11* 15* 17*     Essential hypertension -Continue Coreg, amlodipine   Anemia chronic disease -Baseline hemoglobin between 8 and 9.  Slightly downtrending, probably dilutional.   No active bleeding.  Continue to monitor. Recent Labs    03/21/21 0424 03/21/21 1516 03/22/21 0210 03/22/21 0231 03/23/21 0257 03/24/21 0918 03/31/21 1018 04/09/21 0741 04/10/21 2222 11/06/21 0758 11/06/21 1457 11/07/21 0737  11/08/21 0707 11/09/21 0619 11/10/21 0617  HGB 6.8*   < >  --    < > 7.8* 8.6*   < > 7.1*   < > 8.1*  --  7.8* 7.7* 7.3* 7.2*  MCV 82.7  --   --    < > 76.6* 79.8*   < >  --    < > 90.4  --  85.6 85.9 85.8 85.7  VITAMINB12  --   --   --   --   --   --   --  935*  --   --   --   --   --   --   --   FOLATE  --   --   --   --   --   --   --  6.5  --   --   --   --   --   --   --   FERRITIN 308*  --  599*  --  536* 487*  --  340*  --   --   --   --   --   --   --   TIBC  --   --   --   --   --   --   --  123*  --   --  153*  --   --   --   --   IRON  --   --   --   --   --   --   --  45  --   --  24*  --   --   --   --   RETICCTPCT  --   --   --   --   --   --   --  2.4  --   --   --   --   --   --   --    < > = values in this interval not displayed.    Elevated istat hCG -Initial labs with hCG elevated to 51.6.  Patient denies possibility of her being pregnant. Wendell is negative   Scalp lesion Seems to be a quarter sized crusted lesion.  I will start her on topical Neosporin as well as probiotics.  Oxycodone as needed for pain control.  Muscle spasm of neck -Flexeril twice daily  Goals of care   Code Status: Full Code    Mobility: Encourage ambulation  Skin assessment:     Nutritional status:  Body mass index is 21.67 kg/m.          Diet:  Diet Order             Diet Carb Modified Fluid consistency: Thin; Room service appropriate? Yes  Diet effective now                   DVT prophylaxis:  SCDs Start: 11/06/21 1431   Antimicrobials: Topical Neosporin, oral vancomycin Fluid: Sodium bicarb at 125 mill per hour to continue Consultants: None Family Communication: None at bedside  Status is: Inpatient  Continue in-hospital care because: Needs IV fluid for AKI, metabolic acidosis Level of care: Med-Surg   Dispo: The patient is from: Home  Anticipated d/c is to: Home in 1 to 2 days              Patient currently is not medically stable to  d/c.   Difficult to place patient No     Infusions:   sodium bicarbonate 75 mEq in sodium chloride 0.45 % 1,075 mL infusion 125 mL/hr at 11/10/21 8110    Scheduled Meds:  acidophilus  2 capsule Oral TID   amLODipine  10 mg Oral Daily   carvedilol  25 mg Oral BID WC   Chlorhexidine Gluconate Cloth  6 each Topical Daily   cyclobenzaprine  5 mg Oral BID   insulin aspart  0-5 Units Subcutaneous QHS   insulin aspart  0-9 Units Subcutaneous TID WC   insulin aspart  3 Units Subcutaneous TID WC   insulin glargine-yfgn  10 Units Subcutaneous Daily   neomycin-bacitracin-polymyxin   Topical TID   potassium chloride  40 mEq Oral Q4H   sodium bicarbonate  1,300 mg Oral BID   vancomycin  125 mg Oral QID    PRN meds: dextrose, metoprolol tartrate, mouth rinse, oxyCODONE   Antimicrobials: Anti-infectives (From admission, onward)    Start     Dose/Rate Route Frequency Ordered Stop   11/07/21 1000  vancomycin (VANCOCIN) capsule 125 mg        125 mg Oral 4 times daily 11/07/21 0805 11/17/21 0959       Objective: Vitals:   11/10/21 0431 11/10/21 0606  BP: (!) 150/92 (!) 152/90  Pulse: (!) 101 99  Resp: 16 14  Temp: 98.9 F (37.2 C) 99.6 F (37.6 C)  SpO2: 98% 98%    Intake/Output Summary (Last 24 hours) at 11/10/2021 1009 Last data filed at 11/10/2021 0500 Gross per 24 hour  Intake 3707.38 ml  Output --  Net 3707.38 ml    Filed Weights   11/06/21 0930 11/06/21 1223  Weight: 57 kg 55.5 kg   Weight change:  Body mass index is 21.67 kg/m.   Physical Exam: General exam: Pleasant, young African-American female. Skin: No rashes, lesions or ulcers. HEENT: Crusted scalp lesion posteriorly on the right.  Unable to move neck to the left or right because of muscle spasm Lungs: Clear to auscultation bilaterally CVS: Regular rate and rhythm, no murmur GI/Abd soft, nontender, nondistended, bowel sound present CNS: Alert, awake, oriented x3 Psychiatry: Mood  appropriate Extremities: No pedal edema, no calf tenderness  Data Review: I have personally reviewed the laboratory data and studies available.  F/u labs  Unresulted Labs (From admission, onward)     Start     Ordered   11/09/21 3159  Basic metabolic panel  Daily at 5am,   R     Question:  Specimen collection method  Answer:  Lab=Lab collect   11/08/21 0947   11/09/21 0500  CBC with Differential/Platelet  Daily at 5am,   R     Question:  Specimen collection method  Answer:  Lab=Lab collect   11/08/21 0947            Signed, Terrilee Croak, MD [facility] 11/10/2021

## 2021-11-10 NOTE — Plan of Care (Signed)
  Problem: Activity: Goal: Risk for activity intolerance will decrease Outcome: Progressing   

## 2021-11-10 NOTE — TOC Initial Note (Signed)
Transition of Care [facility]) - Initial/Assessment Note    Patient Details  Name: Ashley Freeman MRN: [id number] Date of Birth: [birth year]  [facility] CM/SW Contact:    Ayriana Wix, Meriam Sprague, RN Phone Number: 11/10/2021, 10:54 AM  Clinical Narrative:                   Expected Discharge Plan: Home/Self Care Barriers to Discharge: Continued Medical Work up     Expected Discharge Plan and Services Expected Discharge Plan: Home/Self Care       Living arrangements for the past 2 months: Apartment       Prior Living Arrangements/Services Living arrangements for the past 2 months: Apartment   Patient language and need for interpreter reviewed:: Yes        Need for Family Participation in Patient Care: No (Comment) Care giver support system in place?: Yes (comment)   Criminal Activity/Legal Involvement Pertinent to Current Situation/Hospitalization: No - Comment as needed  Activities of Daily Living Home Assistive Devices/Equipment: CBG Meter ADL Screening (condition at time of admission) Patient's cognitive ability adequate to safely complete daily activities?: Yes Is the patient deaf or have difficulty hearing?: No Does the patient have difficulty seeing, even when wearing glasses/contacts?: No Does the patient have difficulty concentrating, remembering, or making decisions?: No Patient able to express need for assistance with ADLs?: Yes Does the patient have difficulty dressing or bathing?: No Independently performs ADLs?: Yes (appropriate for developmental age) Does the patient have difficulty walking or climbing stairs?: No Weakness of Legs: None Weakness of Arms/Hands: None   Emotional Assessment Appearance:: Appears stated age     Orientation: : Oriented to Self, Oriented to Place, Oriented to  Time, Oriented to Situation Alcohol / Substance Use: Not Applicable Psych Involvement: No (comment)  Admission diagnosis:  DKA (diabetic ketoacidosis)  (HCC) [E11.10] Diabetic ketoacidosis without coma associated with type 1 diabetes mellitus (HCC) [E10.10] Patient Active Problem List   Diagnosis Date Noted   SIRS (systemic inflammatory response syndrome) (HCC) 11/06/2021   Stage 3b chronic kidney disease (HCC) 08/14/2021   DKA (diabetic ketoacidosis) (HCC) 07/26/2021   HTN (hypertension) 07/26/2021   Acute renal failure superimposed on stage 3b chronic kidney disease (HCC) 06/20/2021   N&V (nausea and vomiting) 06/20/2021   AKI (acute kidney injury) (HCC) 06/20/2021   Type 1 diabetes mellitus with stage 4 chronic kidney disease (HCC) 05/26/2021   Dyslipidemia 05/26/2021   Mild protein malnutrition (HCC) 03/07/2021   DKA, type 1 (HCC) 12/08/2020   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 diabetes mellitus (HCC) 09/17/2020   Systolic ejection murmur 08/03/2019   Type 1 diabetes mellitus with hyperglycemia (HCC) 07/24/2019   Diabetic retinopathy (HCC) 07/02/2019   Proteinuria due to type 1 diabetes mellitus (HCC) 05/21/2019   Diarrhea 05/31/2016   Diabetic neuropathy (HCC) 01/16/2016   Pseudohyponatremia 08/04/2012   Metabolic acidosis 02/19/2012   Leukocytosis 02/19/2012   Normocytic anemia 02/19/2012   Hyperkalemia 02/19/2012   PCP:  Pcp, No Pharmacy:   [id number] - [place] - [address] [place] [address] Phone: [phone] Fax: [phone]     Social Determinants of Health (SDOH) Interventions    Readmission Risk Interventions    11/10/2021   10:52 AM  Readmission Risk Prevention Plan  Transportation Screening Complete  Medication Review (RN Care Manager) Complete  PCP or Specialist appointment within 3-5 days of discharge Complete  HRI or Home Care Consult Complete  SW Recovery Care/Counseling Consult Complete  Palliative Care Screening Not Applicable  Skilled Nursing Facility Not Applicable

## 2021-11-11 ENCOUNTER — Other Ambulatory Visit (HOSPITAL_COMMUNITY): Payer: Self-pay

## 2021-11-11 LAB — CBC WITH DIFFERENTIAL/PLATELET
Abs Immature Granulocytes: 0.77 10*3/uL — ABNORMAL HIGH (ref 0.00–0.07)
Basophils Absolute: 0.1 10*3/uL (ref 0.0–0.1)
Basophils Relative: 0 %
Eosinophils Absolute: 0.3 10*3/uL (ref 0.0–0.5)
Eosinophils Relative: 2 %
HCT: 22.1 % — ABNORMAL LOW (ref 36.0–46.0)
Hemoglobin: 6.9 g/dL — CL (ref 12.0–15.0)
Immature Granulocytes: 4 %
Lymphocytes Relative: 16 %
Lymphs Abs: 3.3 10*3/uL (ref 0.7–4.0)
MCH: 27 pg (ref 26.0–34.0)
MCHC: 31.2 g/dL (ref 30.0–36.0)
MCV: 86.3 fL (ref 80.0–100.0)
Monocytes Absolute: 2 10*3/uL — ABNORMAL HIGH (ref 0.1–1.0)
Monocytes Relative: 10 %
Neutro Abs: 14.5 10*3/uL — ABNORMAL HIGH (ref 1.7–7.7)
Neutrophils Relative %: 68 %
Platelets: 302 10*3/uL (ref 150–400)
RBC: 2.56 MIL/uL — ABNORMAL LOW (ref 3.87–5.11)
RDW: 14.6 % (ref 11.5–15.5)
WBC: 21 10*3/uL — ABNORMAL HIGH (ref 4.0–10.5)
nRBC: 0 % (ref 0.0–0.2)

## 2021-11-11 LAB — BASIC METABOLIC PANEL
Anion gap: 8 (ref 5–15)
BUN: 40 mg/dL — ABNORMAL HIGH (ref 6–20)
CO2: 20 mmol/L — ABNORMAL LOW (ref 22–32)
Calcium: 7.2 mg/dL — ABNORMAL LOW (ref 8.9–10.3)
Chloride: 111 mmol/L (ref 98–111)
Creatinine, Ser: 3.8 mg/dL — ABNORMAL HIGH (ref 0.44–1.00)
GFR, Estimated: 16 mL/min — ABNORMAL LOW (ref >60)
Glucose, Bld: 166 mg/dL — ABNORMAL HIGH (ref 70–99)
Potassium: 3.9 mmol/L (ref 3.5–5.1)
Sodium: 139 mmol/L (ref 135–145)

## 2021-11-11 LAB — GLUCOSE, CAPILLARY
Glucose-Capillary: 167 mg/dL — ABNORMAL HIGH (ref 70–99)
Glucose-Capillary: 148 mg/dL — ABNORMAL HIGH (ref 70–99)

## 2021-11-11 LAB — PREPARE RBC (CROSSMATCH)

## 2021-11-11 MED ORDER — CYCLOBENZAPRINE HCL 5 MG PO TABS
5.0000 mg | ORAL_TABLET | Freq: Two times a day (BID) | ORAL | 0 refills | Status: AC
  Filled 2021-11-11: qty 14, 7d supply, fill #0

## 2021-11-11 MED ORDER — CARVEDILOL 25 MG PO TABS
25.0000 mg | ORAL_TABLET | Freq: Two times a day (BID) | ORAL | 2 refills | Status: DC
  Filled 2021-11-11: qty 60, 30d supply, fill #0
  Filled 2021-12-19 – 2022-02-28 (×4): qty 60, 30d supply, fill #1

## 2021-11-11 MED ORDER — CEFDINIR 300 MG PO CAPS
300.0000 mg | ORAL_CAPSULE | Freq: Two times a day (BID) | ORAL | 0 refills | Status: AC
  Filled 2021-11-11: qty 10, 5d supply, fill #0

## 2021-11-11 MED ORDER — SODIUM CHLORIDE 0.9% IV SOLUTION: Freq: Once | INTRAVENOUS | Status: DC

## 2021-11-11 MED ORDER — VANCOMYCIN HCL 125 MG PO CAPS
125.0000 mg | ORAL_CAPSULE | Freq: Four times a day (QID) | ORAL | 0 refills | Status: AC
  Filled 2021-11-11: qty 20, 5d supply, fill #0

## 2021-11-11 MED ORDER — BACITRACIN-NEOMYCIN-POLYMYXIN OINTMENT TUBE
1.0000 | TOPICAL_OINTMENT | Freq: Three times a day (TID) | CUTANEOUS | 0 refills | Status: AC
  Filled 2021-11-11: qty 21, 7d supply, fill #0

## 2021-11-11 MED ORDER — SODIUM BICARBONATE 650 MG PO TABS
1300.0000 mg | ORAL_TABLET | Freq: Two times a day (BID) | ORAL | 0 refills | Status: AC
  Filled 2021-11-11: qty 28, 7d supply, fill #0

## 2021-11-11 MED ORDER — AMLODIPINE BESYLATE 10 MG PO TABS
10.0000 mg | ORAL_TABLET | Freq: Every day | ORAL | 2 refills | Status: DC
  Filled 2021-11-11: qty 30, 30d supply, fill #0
  Filled 2021-12-19 – 2022-01-21 (×2): qty 30, 30d supply, fill #1

## 2021-11-11 MED ORDER — OXYCODONE HCL 5 MG PO TABS
5.0000 mg | ORAL_TABLET | Freq: Four times a day (QID) | ORAL | 0 refills | Status: AC | PRN
  Filled 2021-11-11: qty 20, 5d supply, fill #0

## 2021-11-11 NOTE — Progress Notes (Signed)
A consult was placed to the hospital's IV Therapist for new iv access;  the right arm is swollen and tight from a previous iv;  attempted x 2-  in the left forearm and the Left upper arm, each time using ultrasound; very poor venous status; scar tissue noted; CrCL 24, so probably not a good picc line candidate;  RN aware of no access at this time.

## 2021-11-11 NOTE — Progress Notes (Signed)
Patient discharged to home, discharge instructions reviewed with patient who verbalized understanding.  

## 2021-11-11 NOTE — Discharge Summary (Signed)
Physician Discharge Summary  Chadwick Yuen [place] JYN:[id number] DOB: 03/11/1991 DOA: 11/06/2021  PCP: Pcp, No  Admit date: 11/06/2021 Discharge date: 11/11/2021  Admitted From: Home Discharge disposition: Home  Recommendations at discharge:  Continue oral Omnicef and oral vancomycin for 5 more days As needed pain meds Follow-up with PCP in 1 week for repeat labs Outpatient appointment with nephrology.  Brief narrative: Ashley Freeman is a 31 y.o. female with PMH significant for DM1, HTN, CKD 4, history of DKA, history of severe diarrhea with C. difficile last year. Patient presented to the ED on 7/10 with complaint of hyperglycemia which she thinks is related to OmniPod malfunction.  She also reported intermittent diarrhea for few days.  Initial labs with glucose elevated over 600, blood gas with pH low at 7.07, PCO2 less than 18, bicarb low at 2.3. Potassium 5.4, BUN/creatinine 69/5.5, WBC count elevated to 23.8, hemoglobin at 8.1 Admitted to hospital service with severe DKA. See below for details  Subjective: Patient was seen and examined this morning. Lying down in bed.  Not in distress.  Headache improving.  Hospital course: Severe DKA  -Patient reports it is because of OmniPod malfunction. -Initial labs with significantly low PH, bicarbonate level, elevated blood glucose level -Started on DKA protocol with IV insulin drip, IV fluid, electrolyte monitoring -Subsequently switched to subcutaneous insulin.  Uncontrolled type 2 diabetes mellitus Hyperglycemia/hypoglycemia -A1c was 8 on 07/26/2021.  It seems to have worsened to 9.1 when repeated on 11/09/2021. -PTA patient was on insulin pump. -Initially he was placed on insulin infusion for DKA.  Subsequently switched to subcutaneous insulin.   -Currently on Semglee 10 units daily, NovoLog 3 units 3 times daily along with sliding scale insulin.   -Patient does not have her insulin pump supplies here in the  hospital.  She will resume insulin pump post discharge. Recent Labs  Lab 11/10/21 0803 11/10/21 1149 11/10/21 1718 11/10/21 2031 11/11/21 0741  GLUCAP 165* 90 93 77 167*    Scalp cellulitis -In the left parietal scalp, patient has a quarter sized area of lichenified swelling which is tender.  The pain extends down to the left side of her neck.   -7/14, CT head did not show intracranial abnormality but showed focal soft tissue density extending to the skin surface in the height left parietal scalp, probably cellulitis.  There is diffuse edema extending into the left parietal and occipital scalp into the visualized area of neck.  -She was started on IV Rocephin and pain control.  -Discharge on oral Omnicef for 5 more days.  Also to continue vancomycin for the duration because of coexisting C. Difficile.  Continue local Neosporin ointment as well. -Pain control with as needed Percocet.  Flexeril for neck spasm  C. difficile infection -Patient reported history of severe C. difficile last year.  She reported intermittent diarrhea and this presentation.   -C. difficile assay with positive antigen but negative toxin.  Previous hospitalist made a decision to treat based on her symptoms and risk of rapid deterioration.  I would continue oral vancomycin for 5 more days while she is on oral Omnicef for cellulitis.  AKI on CKD 4 Acute metabolic acidosis -Baseline creatinine close to 3.  Presented with creatinine elevated to 5.54 in the setting of DKA.   -Gradually improving but still not close to baseline.  3.8 today.  She has gotten significant amount of IV fluid and is starting to develop anasarca now.  We will stop IV hydration at this  time.  I think her renal function will gradually improve by self at home. -She has an appointment with nephrology as an outpatient in August. -She will continue sodium bicarb tablet for next 1 week at home. Recent Labs    11/06/21 0758 11/06/21 1457 11/06/21 1734  11/06/21 2220 11/07/21 0158 11/07/21 0737 11/08/21 0707 11/09/21 0619 11/10/21 0617 11/11/21 0607  BUN 69* 67* 65* 63* 62* 59* 55* 49* 42* 40*  CREATININE 5.54* 5.33* 5.24* 4.84* 4.95* 4.92* 4.61* 4.13* 3.90* 3.80*  CO2 <7* <7* 9* 12* 12* 10* 11* 15* 17* 20*    Acute on chronic anemia -Baseline hemoglobin between 8 and 9.  This hospitalization, she got more than 9 L positive fluid balance.  Hemoglobin gradually down trended.  It is 6.9 today.  However she has no evidence of active bleeding.  I do not think she needs blood transfusion at this time.  To repeat blood work as an outpatient with PCP in a week. Recent Labs    03/21/21 0424 03/21/21 1516 03/22/21 0210 03/22/21 0231 03/23/21 0257 03/24/21 0918 03/31/21 1018 04/09/21 0741 04/10/21 2222 11/06/21 1457 11/07/21 0737 11/08/21 0707 11/09/21 0619 11/10/21 0617 11/11/21 0607  HGB 6.8*   < >  --    < > 7.8* 8.6*   < > 7.1*   < >  --  7.8* 7.7* 7.3* 7.2* 6.9*  MCV 82.7  --   --    < > 76.6* 79.8*   < >  --    < >  --  85.6 85.9 85.8 85.7 86.3  VITAMINB12  --   --   --   --   --   --   --  935*  --   --   --   --   --   --   --   FOLATE  --   --   --   --   --   --   --  6.5  --   --   --   --   --   --   --   FERRITIN 308*  --  599*  --  536* 487*  --  340*  --   --   --   --   --   --   --   TIBC  --   --   --   --   --   --   --  123*  --  153*  --   --   --   --   --   IRON  --   --   --   --   --   --   --  45  --  24*  --   --   --   --   --   RETICCTPCT  --   --   --   --   --   --   --  2.4  --   --   --   --   --   --   --    < > = values in this interval not displayed.   Essential hypertension -Blood pressure controlled with Coreg, amlodipine -Lisinopril on hold for now because of AKI   Elevated istat hCG -Initial labs with hCG elevated to 51.6.  Patient denies possibility of her being pregnant. HGC quant is negative  Wounds:  -    Discharge Exam:   Vitals:   11/10/21 0606 11/10/21 1720 11/10/21  2028  11/11/21 0513  BP: (!) 152/90 (!) 146/98 (!) 136/92 140/81  Pulse: 99 (!) 102 (!) 105 98  Resp: 14 18  17   Temp: 99.6 F (37.6 C) 99.7 F (37.6 C) 99.1 F (37.3 C) 99.1 F (37.3 C)  TempSrc: Oral Oral Oral Oral  SpO2: 98% 97% 100% 97%  Weight:      Height:        Body mass index is 21.67 kg/m.   General exam: Pleasant, young African-American female. Skin: No rashes, lesions or ulcers. HEENT: Crusted scalp lesion posteriorly on the left.  Neck spasm improving on Flexeril Lungs: Clear to auscultation bilaterally CVS: Regular rate and rhythm, no murmur GI/Abd soft, nontender, nondistended, bowel sound present CNS: Alert, awake, oriented x3 Psychiatry: Mood appropriate Extremities: No pedal edema, no calf tenderness  Follow ups:    Follow-up Information     Marykay Lex, MD Follow up.   Specialty: Cardiology Contact information: [address] [place] [address] [phone]                 Discharge Instructions:   Discharge Instructions     Call MD for:  difficulty breathing, headache or visual disturbances   Complete by: As directed    Call MD for:  extreme fatigue   Complete by: As directed    Call MD for:  hives   Complete by: As directed    Call MD for:  persistant dizziness or light-headedness   Complete by: As directed    Call MD for:  persistant nausea and vomiting   Complete by: As directed    Call MD for:  severe uncontrolled pain   Complete by: As directed    Call MD for:  temperature >100.4   Complete by: As directed    Diet - low sodium heart healthy   Complete by: As directed    Diet Carb Modified   Complete by: As directed    Discharge instructions   Complete by: As directed    Recommendations at discharge:   Continue oral Omnicef and oral vancomycin for 5 more days  As needed pain meds  Follow-up with PCP in 1 week for repeat labs  Outpatient appointment with nephrology.  Discharge instructions for  diabetes mellitus: Check blood sugar 3 times a day and bedtime at home. If blood sugar running above 200 or less than 70 please call your MD to adjust insulin. If you notice signs and symptoms of hypoglycemia (low blood sugar) like jitteriness, confusion, thirst, tremor and sweating, please check blood sugar, drink sugary drink/biscuits/sweets to increase sugar level and call MD or return to ER.    General discharge instructions: Follow with Primary MD Pcp, No in 7 days  Please request your PCP  to go over your hospital tests, procedures, radiology results at the follow up. Please get your medicines reviewed and adjusted.  Your PCP may decide to repeat certain labs or tests as needed. Do not drive, operate heavy machinery, perform activities at heights, swimming or participation in water activities or provide baby sitting services if your were admitted for syncope or siezures until you have seen by Primary MD or a Neurologist and advised to do so again. [place] Controlled Substance Reporting System database was reviewed. Do not drive, operate heavy machinery, perform activities at heights, swim, participate in water activities or provide baby-sitting services while on medications for pain, sleep and mood until your outpatient physician has reevaluated you and advised  to do so again.  You are strongly recommended to comply with the dose, frequency and duration of prescribed medications. Activity: As tolerated with Full fall precautions use walker/cane & assistance as needed Avoid using any recreational substances like cigarette, tobacco, alcohol, or non-prescribed drug. If you experience worsening of your admission symptoms, develop shortness of breath, life threatening emergency, suicidal or homicidal thoughts you must seek medical attention immediately by calling 911 or calling your MD immediately  if symptoms less severe. You must read complete instructions/literature along with all the  possible adverse reactions/side effects for all the medicines you take and that have been prescribed to you. Take any new medicine only after you have completely understood and accepted all the possible adverse reactions/side effects.  Wear Seat belts while driving. You were cared for by a hospitalist during your hospital stay. If you have any questions about your discharge medications or the care you received while you were in the hospital after you are discharged, you can call the unit and ask to speak with the hospitalist or the covering physician. Once you are discharged, your primary care physician will handle any further medical issues. Please note that NO REFILLS for any discharge medications will be authorized once you are discharged, as it is imperative that you return to your primary care physician (or establish a relationship with a primary care physician if you do not have one).   Increase activity slowly   Complete by: As directed        Discharge Medications:   Allergies as of 11/11/2021       Reactions   Cephalexin Anaphylaxis   Has gotten ceftriaxone in the past    Penicillins Hives, Rash   Has patient had a PCN reaction causing immediate rash, facial/tongue/throat swelling, SOB or lightheadedness with hypotension: Yes Has patient had a PCN reaction causing severe rash involving mucus membranes or skin necrosis: No Has patient had a PCN reaction that required hospitalization: Yes Has patient had a PCN reaction occurring within the last 10 years: No Spoke with pt - childhood hives told by mom, tried no pcns since, doesn't remember reaction herself    Benadryl [diphenhydramine] Itching   Doxycycline Itching        Medication List     STOP taking these medications    lisinopril 40 MG tablet Commonly known as: ZESTRIL       TAKE these medications    acetaminophen 500 MG tablet Commonly known as: TYLENOL Take 1,000 mg by mouth every 6 (six) hours as needed for  headache (pain).   amLODipine 10 MG tablet Commonly known as: NORVASC Take 1 tablet (10 mg total) by mouth daily.   blood glucose meter kit and supplies Kit Dispense based on patient and insurance preference. Use up to four times daily as directed. (FOR ICD-9 250.00, 250.01).   Blood Pressure Kit Devi 1 Device by Does not apply route as needed.   carvedilol 25 MG tablet Commonly known as: COREG Take 1 tablet (25 mg total) by mouth 2 (two) times daily.   cefdinir 300 MG capsule Commonly known as: OMNICEF Take 1 capsule (300 mg total) by mouth 2 (two) times daily for 5 days.   cyclobenzaprine 5 MG tablet Commonly known as: FLEXERIL Take 1 tablet (5 mg total) by mouth 2 (two) times daily for 7 days.   Dexcom G6 Sensor Misc Inject 1 Device into the skin as directed.   Dexcom G6 Transmitter Misc Inject 1 Device into the  skin as directed. Use to check blood sugar daily   insulin aspart 100 UNIT/ML injection Commonly known as: novoLOG Max daily 50 units .Uses insulin pump What changed:  when to take this additional instructions   lurasidone 40 MG Tabs tablet Commonly known as: LATUDA Take 40 mg by mouth daily with breakfast.   neomycin-bacitracin-polymyxin Oint Commonly known as: NEOSPORIN Apply 1 Application topically 3 (three) times daily for 7 days.   Omnipod 5 G6 Pod (Gen 5) Misc 1 Device by Does not apply route every 3 (three) days.   oxyCODONE 5 MG immediate release tablet Commonly known as: Oxy IR/ROXICODONE Take 1 tablet (5 mg total) by mouth every 6 (six) hours as needed for up to 5 days for severe pain or breakthrough pain.   Pentips 32G X 4 MM Misc Generic drug: Insulin Pen Needle Use as directed   prenatal multivitamin Tabs tablet Take 1 tablet by mouth daily at 12 noon.   sodium bicarbonate 650 MG tablet Take 2 tablets (1,300 mg total) by mouth 2 (two) times daily for 7 days.   vancomycin 125 MG capsule Commonly known as: VANCOCIN Take 1 capsule  (125 mg total) by mouth 4 (four) times daily for 5 days.         The results of significant diagnostics from this hospitalization (including imaging, microbiology, ancillary and laboratory) are listed below for reference.    Procedures and Diagnostic Studies:   US RENAL  Result Date: 11/06/2021 CLINICAL DATA:  AKI EXAM: RENAL / URINARY TRACT ULTRASOUND COMPLETE COMPARISON:  None Available. FINDINGS: Right Kidney: Renal measurements: 10.8 x 5.1 x 6.4 cm = volume: 183 mL. Increased parenchymal echogenicity. No mass or hydronephrosis visualized. Left Kidney: Renal measurements: 9.7 x 5.8 x 5.8 cm = volume: 171 mL. Increased parenchymal echogenicity. No mass or hydronephrosis visualized. Bladder: Appears normal for degree of bladder distention. Other: Trace perisplenic ascites. IMPRESSION: 1. No hydronephrosis. 2. Increased parenchymal echogenicity, findings can be seen in the setting of medical renal disease. 3. Trace perisplenic ascites. Electronically Signed   By: Allegra Lai M.D.   On: 11/06/2021 15:47   DG Chest Port 1 View  Result Date: 11/06/2021 CLINICAL DATA:  Pt arrived via POV, c/o hyperglycemia. EXAM: PORTABLE CHEST 1 VIEW COMPARISON:  Chest radiograph 04/24/2021 FINDINGS: The cardiomediastinal contours are within normal limits. The lungs are clear. No pneumothorax or pleural effusion. No acute finding in the visualized skeleton. IMPRESSION: No active cardiopulmonary disease. Electronically Signed   By: Emmaline Kluver M.D.   On: 11/06/2021 08:35     Labs:   Basic Metabolic Panel: Recent Labs  Lab 11/07/21 0737 11/08/21 0707 11/09/21 0619 11/10/21 0617 11/11/21 0607  NA 136 136 138 135 139  K 3.7 3.5 3.5 2.9* 3.9  CL 115* 113* 113* 109 111  CO2 10* 11* 15* 17* 20*  GLUCOSE 156* 220* 280* 185* 166*  BUN 59* 55* 49* 42* 40*  CREATININE 4.92* 4.61* 4.13* 3.90* 3.80*  CALCIUM 7.9* 7.5* 7.2* 6.8* 7.2*  MG  --  1.4* 1.4*  --   --   PHOS  --   --  5.3*  --   --     GFR Estimated Creatinine Clearance: 18.1 mL/min (A) (by C-G formula based on SCr of 3.8 mg/dL (H)). Liver Function Tests: Recent Labs  Lab 11/06/21 0758 11/08/21 0707  AST 19 15  ALT 18 14  ALKPHOS 84 73  BILITOT 1.2 0.4  PROT 6.4* 5.3*  ALBUMIN 2.2* 1.7*  No results for input(s): "LIPASE", "AMYLASE" in the last 168 hours. No results for input(s): "AMMONIA" in the last 168 hours. Coagulation profile No results for input(s): "INR", "PROTIME" in the last 168 hours.  CBC: Recent Labs  Lab 11/06/21 0758 11/07/21 0737 11/08/21 0707 11/09/21 0619 11/10/21 0617 11/11/21 0607  WBC 23.8* 23.9* 16.3* 15.8* 20.5* 21.0*  NEUTROABS 19.6*  --   --  11.6* 14.7* 14.5*  HGB 8.1* 7.8* 7.7* 7.3* 7.2* 6.9*  HCT 26.4* 24.3* 24.4* 23.6* 22.7* 22.1*  MCV 90.4 85.6 85.9 85.8 85.7 86.3  PLT 320 336 326 269 289 302   Cardiac Enzymes: No results for input(s): "CKTOTAL", "CKMB", "CKMBINDEX", "TROPONINI" in the last 168 hours. BNP: Invalid input(s): "POCBNP" CBG: Recent Labs  Lab 11/10/21 0803 11/10/21 1149 11/10/21 1718 11/10/21 2031 11/11/21 0741  GLUCAP 165* 90 93 77 167*   D-Dimer No results for input(s): "DDIMER" in the last 72 hours. Hgb A1c Recent Labs    11/09/21 0619  HGBA1C 9.1*   Lipid Profile No results for input(s): "CHOL", "HDL", "LDLCALC", "TRIG", "CHOLHDL", "LDLDIRECT" in the last 72 hours. Thyroid function studies No results for input(s): "TSH", "T4TOTAL", "T3FREE", "THYROIDAB" in the last 72 hours.  Invalid input(s): "FREET3" Anemia work up No results for input(s): "VITAMINB12", "FOLATE", "FERRITIN", "TIBC", "IRON", "RETICCTPCT" in the last 72 hours. Microbiology Recent Results (from the past 240 hour(s))  Gastrointestinal Panel by PCR , Stool     Status: None   Collection Time: 11/06/21  6:10 PM   Specimen: STOOL  Result Value Ref Range Status   Campylobacter species NOT DETECTED NOT DETECTED Final   Plesimonas shigelloides NOT DETECTED NOT DETECTED  Final   Salmonella species NOT DETECTED NOT DETECTED Final   Yersinia enterocolitica NOT DETECTED NOT DETECTED Final   Vibrio species NOT DETECTED NOT DETECTED Final   Vibrio cholerae NOT DETECTED NOT DETECTED Final   Enteroaggregative E coli (EAEC) NOT DETECTED NOT DETECTED Final   Enteropathogenic E coli (EPEC) NOT DETECTED NOT DETECTED Final   Enterotoxigenic E coli (ETEC) NOT DETECTED NOT DETECTED Final   Shiga like toxin producing E coli (STEC) NOT DETECTED NOT DETECTED Final   E. coli O157 NOT DETECTED NOT DETECTED Final   Shigella/Enteroinvasive E coli (EIEC) NOT DETECTED NOT DETECTED Final   Cryptosporidium NOT DETECTED NOT DETECTED Final   Cyclospora cayetanensis NOT DETECTED NOT DETECTED Final   Entamoeba histolytica NOT DETECTED NOT DETECTED Final   Giardia lamblia NOT DETECTED NOT DETECTED Final   Adenovirus F40/41 NOT DETECTED NOT DETECTED Final   Astrovirus NOT DETECTED NOT DETECTED Final   Norovirus GI/GII NOT DETECTED NOT DETECTED Final   Rotavirus A NOT DETECTED NOT DETECTED Final   Sapovirus (I, II, IV, and V) NOT DETECTED NOT DETECTED Final    Comment: Performed at [facility], [address] [place], [place] [address]  C Difficile Quick Screen w PCR reflex     Status: Abnormal   Collection Time: 11/06/21  6:10 PM   Specimen: STOOL  Result Value Ref Range Status   C Diff antigen POSITIVE (A) NEGATIVE Final   C Diff toxin NEGATIVE NEGATIVE Final   C Diff interpretation Results are indeterminate. See PCR results.  Final    Comment: Performed at [facility] [address] [place], [place] [address]  C. Diff by PCR, Reflexed     Status: Abnormal   Collection Time: 11/06/21  6:10 PM  Result Value Ref Range Status   Toxigenic C. Difficile  by PCR POSITIVE (A) NEGATIVE Final    Comment: Positive for toxigenic C. difficile with little to no toxin production. Only treat if clinical presentation suggests symptomatic illness. Performed at  [facility], [address], [place], [place] [address]   MRSA Next Gen by PCR, Nasal     Status: None   Collection Time: 11/06/21  7:55 PM   Specimen: Nasal Mucosa; Nasal Swab  Result Value Ref Range Status   MRSA by PCR Next Gen NOT DETECTED NOT DETECTED Final    Comment: (NOTE) The GeneXpert MRSA Assay (FDA approved for NASAL specimens only), is one component of a comprehensive MRSA colonization surveillance program. It is not intended to diagnose MRSA infection nor to guide or monitor treatment for MRSA infections. Test performance is not FDA approved in patients less than 77 years old. Performed at [facility], [address], [place], [place] [address]     Time coordinating discharge: 35 minutes  Signed: Melina Schools Sava Proby  [facility] 11/11/2021, 12:06 PM

## 2021-11-15 DIAGNOSIS — A0472 Enterocolitis due to Clostridium difficile, not specified as recurrent: Secondary | ICD-10-CM | POA: Insufficient documentation

## 2021-11-15 DIAGNOSIS — L989 Disorder of the skin and subcutaneous tissue, unspecified: Secondary | ICD-10-CM | POA: Insufficient documentation

## 2021-11-15 DIAGNOSIS — I519 Heart disease, unspecified: Secondary | ICD-10-CM | POA: Insufficient documentation

## 2021-11-15 LAB — TYPE AND SCREEN
ABO/RH(D): A POS
Antibody Screen: NEGATIVE
Unit division: 0

## 2021-11-15 LAB — BPAM RBC
Blood Product Expiration Date: 202307312359
Unit Type and Rh: 6200

## 2021-11-16 ENCOUNTER — Telehealth: Payer: Self-pay

## 2021-11-16 NOTE — Telephone Encounter (Signed)
Patient was in the hospital for DKA. Would like to be seen sooner that September. Would you like to double book patient?

## 2021-11-16 NOTE — Telephone Encounter (Signed)
Ashley Freeman, lets bring her next Thursday 1130, let her know that if she cannot make it that day will be a while before she is able to see me so she must make it that day

## 2021-11-17 ENCOUNTER — Other Ambulatory Visit: Payer: Self-pay

## 2021-11-17 NOTE — Telephone Encounter (Signed)
LMTCB and sent mychart message

## 2021-11-20 ENCOUNTER — Other Ambulatory Visit: Payer: Self-pay

## 2021-11-20 MED ORDER — OMNIPOD 5 DEXG7G6 INTRO GEN 5 KIT: PACK | 0 refills | Status: DC

## 2021-11-23 ENCOUNTER — Ambulatory Visit (INDEPENDENT_AMBULATORY_CARE_PROVIDER_SITE_OTHER): Payer: Medicaid Other | Admitting: Internal Medicine

## 2021-11-23 VITALS — BP 152/84 | HR 89 | Temp 98.1°F | Ht 63.0 in | Wt 136.4 lb

## 2021-11-23 DIAGNOSIS — E1022 Type 1 diabetes mellitus with diabetic chronic kidney disease: Secondary | ICD-10-CM

## 2021-11-23 DIAGNOSIS — E1065 Type 1 diabetes mellitus with hyperglycemia: Secondary | ICD-10-CM | POA: Diagnosis not present

## 2021-11-23 DIAGNOSIS — N184 Chronic kidney disease, stage 4 (severe): Secondary | ICD-10-CM | POA: Diagnosis not present

## 2021-11-23 NOTE — Progress Notes (Signed)
Name: Ashley Freeman  Age/ Sex: 31 y.o., female   MRN/ DOB: [id number], 1991/04/06     PCP: Pcp, No   Reason for Endocrinology Evaluation: Type 2 Diabetes Mellitus  Initial Endocrine Consultative Visit: 06/27/18    PATIENT IDENTIFIER: [name] is a 31 y.o. 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 The patient has followed with Endocrinology clinic since 06/27/2018 for consultative assistance with management of her diabetes.  DIABETIC HISTORY:  [name] was diagnosed with T1DM at age 63. Hx of multiple DKA's due to non-compliance. Her hemoglobin A1c has ranged from  in 11.3% in 2011, peaking at 19.3% in 2015.  S/P C-section 10/2019 ( [name])  SUBJECTIVE:   During the last visit (05/24/2021): A1c 9.4 % .  We adjusted pump settings   Today (11/23/2021): [name] is here for a follow up on diabetes management .  She checks glucose multiple times a day through CGM  She was admitted for DKA November 06, 2021 Patient was readmitted again on July 19 for anasarca She was also treated for C. difficile with vancomycin  Was recently dx with lupus   HOME DIABETES REGIMEN:   Novolog        Pump   Omnipod Settings   Insulin type   NOVOLOG     Basal rate          0000 0.05                     I:C ratio          0000 15                       AIT  2.5           Sensitivity          0000  15           Goal          0000  130            DIABETIC COMPLICATIONS: Microvascular complications:   Denies: neuropathy, retinopathy, CKD Last eye exam: Completed 02/2020    Macrovascular complications:    Denies: CAD, PVD, CVA  HISTORY:  Past Medical History:  Past Medical History:  Diagnosis Date   Abscess, gluteal, right 08/24/2013   AKI (acute kidney injury) ([place]) 07/26/2014   Anemia 02/19/2012   Bartholin's gland abscess  09/19/2013   BV (bacterial vaginosis) 11/24/2015   Diabetes mellitus type I ([place]) 2001   Diagnosed at age 72 ; Type I   Diarrhea 05/30/2016   DKA (diabetic ketoacidoses) 08/19/2013   Also in 2018   Gonorrhea 08/2011   Treated in 09/2011   History of trichomoniasis 05/31/2016   Hyperlipidemia 03/28/2016   Sepsis ([place]) 09/19/2013   Past Surgical History:  Past Surgical History:  Procedure Laterality Date   CESAREAN SECTION N/A 10/05/2019   Procedure: CESAREAN SECTION;  Surgeon: Aletha Halim, MD;  [facility];  Service: Obstetrics;  Laterality: N/A;   INCISION AND DRAINAGE ABSCESS Left 09/28/2019   Procedure: INCISION AND DRAINAGE VULVAR ABCESS;  Surgeon: [name], MD;  Location: [place];  Service: Gynecology;  Laterality: Left;   INCISION AND DRAINAGE PERIRECTAL ABSCESS Right 08/18/2013   Procedure: IRRIGATION AND DEBRIDEMENT GLUTEAL ABSCESS;  Surgeon: Ralene Ok, MD;  Location: [place];  Service:  General;  Laterality: Right;   INCISION AND DRAINAGE PERIRECTAL ABSCESS Right 09/19/2013   Procedure: IRRIGATION AND DEBRIDEMENT RIGHT GLUTEAL AND LABIAL ABSCESSES;  Surgeon: Ralene Ok, MD;  Location: [place];  Service: General;  Laterality: Right;   INCISION AND DRAINAGE PERIRECTAL ABSCESS Right 09/24/2013   Procedure: IRRIGATION AND DEBRIDEMENT PERIRECTAL ABSCESS;  Surgeon: Gwenyth Ober, MD;  Location: [place];  Service: General;  Laterality: Right;   Social History:  reports that she has never smoked. She has never used smokeless tobacco. She reports that she does not currently use alcohol. She reports that she does not use drugs. Family History:  Family History  Problem Relation Age of Onset   Asthma Mother    Carpal tunnel syndrome Mother    Gout Father    Diabetes Paternal Grandmother    Anesthesia problems Neg Hx      HOME MEDICATIONS: Allergies as of 11/23/2021       Reactions   Cephalexin Anaphylaxis   Has gotten ceftriaxone in the past    Penicillins Hives,  Rash   Has patient had a PCN reaction causing immediate rash, facial/tongue/throat swelling, SOB or lightheadedness with hypotension: Yes Has patient had a PCN reaction causing severe rash involving mucus membranes or skin necrosis: No Has patient had a PCN reaction that required hospitalization: Yes Has patient had a PCN reaction occurring within the last 10 years: No Spoke with pt - childhood hives told by mom, tried no pcns since, doesn't remember reaction herself    Benadryl [diphenhydramine] Itching   Doxycycline Itching        Medication List        Accurate as of November 23, 2021 12:02 PM. If you have any questions, ask your nurse or doctor.          acetaminophen 500 MG tablet Commonly known as: TYLENOL Take 1,000 mg by mouth every 6 (six) hours as needed for headache (pain).   amLODipine 10 MG tablet Commonly known as: NORVASC Take 1 tablet (10 mg total) by mouth daily.   blood glucose meter kit and supplies Kit Dispense based on patient and insurance preference. Use up to four times daily as directed. (FOR ICD-9 250.00, 250.01).   Blood Pressure Kit Devi 1 Device by Does not apply route as needed.   carvedilol 25 MG tablet Commonly known as: COREG Take 1 tablet (25 mg total) by mouth 2 (two) times daily.   Dexcom G6 Sensor Misc Inject 1 Device into the skin as directed.   Dexcom G6 Transmitter Misc Inject 1 Device into the skin as directed. Use to check blood sugar daily   insulin aspart 100 UNIT/ML injection Commonly known as: novoLOG Max daily 50 units .Uses insulin pump What changed:  when to take this additional instructions   lurasidone 40 MG Tabs tablet Commonly known as: LATUDA Take 40 mg by mouth daily with breakfast.   Omnipod 5 G6 Pod (Gen 5) Misc 1 Device by Does not apply route every 3 (three) days.   Omnipod 5 G6 Intro (Gen 5) Kit Change pods every 3 days   Pentips 32G X 4 MM Misc Generic drug: Insulin Pen Needle Use as directed    prenatal multivitamin Tabs tablet Take 1 tablet by mouth daily at 12 noon.         OBJECTIVE:   PHYSICAL EXAM: [id number] were no vitals taken for this visit.  There were no vitals taken for this visit.   EXAM: General: Pt appears well  and is in NAD  Lungs: Clear with good BS bilat with no rales, rhonchi, or wheezes  Heart: RRR  Abdomen:   soft and non-tender  Extremities: No pretibial edema   Mental Status: Judgment, insight: intact Mood and affect: no depression, anxiety, or agitation     DM foot exam: 11/23/2021 The skin of the feet is intact without sores or ulcerations. The pedal pulses are 2+ on right and 2+ on left. The sensation is intact to a screening 5.07, 10 gram monofilament bilaterally     DATA REVIEWED:  Lab Results  Component Value Date   HGBA1C 9.1 (H) 11/09/2021   HGBA1C 8.0 (H) 07/26/2021   HGBA1C 8.3 (A) 05/24/2021    Latest Reference Range & Units 11/11/21 06:07  Sodium 135 - 145 mmol/L 139  Potassium 3.5 - 5.1 mmol/L 3.9  Chloride 98 - 111 mmol/L 111  CO2 22 - 32 mmol/L 20 (L)  Glucose 70 - 99 mg/dL 166 (H)  BUN 6 - 20 mg/dL 40 (H)  Creatinine 0.44 - 1.00 mg/dL 3.80 (H)  Calcium 8.9 - 10.3 mg/dL 7.2 (L)  Anion gap 5 - 15  8  GFR, Estimated >60 mL/min 16 (L)  (L): Data is abnormally low (H): Data is abnormally high    Latest Reference Range & Units 05/24/21 15:52  Creatinine,U mg/dL 36.3  Microalb, Ur 0.0 - 1.9 mg/dL 240.0 (H)  MICROALB/CREAT RATIO 0.0 - 30.0 mg/g 661.4 (H)      ASSESSMENT / PLAN / RECOMMENDATIONS:   1) Type 1 Diabetes Mellitus, Poorly controlled, With CKD IV - Most recent A1c of 9.1 %. Goal A1c < 7.0 %.    -Patient continues with hyperglycemia, her most recent DKA is because she was trying to take a break for the OmniPod as she can only use it on her arms, and at times it bothers her and needs a break and despite using multiple daily injections of insulin she ended up with DKA -I have strongly encouraged the  patient to continue using the OmniPod without interruptions, but if she must have an interruption then she needs to give herself 1 dose of Lantus 2 hours before she removes the pump, in addition to using NovoLog with each meal.  She was also advised to apply the pump 2 hours before her Lantus dose runs out  -She has been on the OmniPod for 2 days now, and review of her download the patient has accidentally entered minimal amount of basal insulin -I have changed and increase her basal rate, I have also adjusted her correction factor as well as insulin to carb ratio and active insulin time  MEDICATIONS:   Change Novolog 1:10 grams  Continue Lantus 10 units  Correction Factor : Novolog (BG-130/60)     Pump   Omnipod Settings   Insulin type   NOVOLOG     Basal rate          0000 0.5                     I:C ratio          0000 12                       AIT  4 hrs            Sensitivity          0000  50           Goal  0000  130                     EDUCATION / INSTRUCTIONS: BG monitoring instructions: Patient is instructed to check her blood sugars 4 times a day, before meals and bedtime  Call [place] [facility] clinic if: BG persistently < 70 I reviewed the Rule of 15 for the treatment of hypoglycemia in detail with the patient. Literature supplied.   2) Diabetic complications:  Eye: Does not have known diabetic retinopathy.  Neuro/ Feet: Does not have known diabetic peripheral neuropathy. Renal: Patient does not have known baseline CKD. She is not on an ACEI/ARB at present.but has microalbuminuria, has a pending referral to nephrology per pt.       F/U in 4 months      Signed electronically by: Mack Guise, MD  [facility] Endocrinology  [facility] [address] [place], [place] [address] Phone: [phone] FAX: [phone]   CC: Pcp, No No address on file Phone: None  Fax: None  Return to  Endocrinology clinic as below: No future appointments.

## 2021-12-01 ENCOUNTER — Encounter: Payer: Self-pay | Admitting: Internal Medicine

## 2021-12-07 DIAGNOSIS — R188 Other ascites: Secondary | ICD-10-CM | POA: Insufficient documentation

## 2021-12-15 ENCOUNTER — Ambulatory Visit: Payer: Medicaid Other | Admitting: Internal Medicine

## 2021-12-19 ENCOUNTER — Other Ambulatory Visit (HOSPITAL_COMMUNITY): Payer: Self-pay

## 2021-12-23 DIAGNOSIS — I120 Hypertensive chronic kidney disease with stage 5 chronic kidney disease or end stage renal disease: Secondary | ICD-10-CM | POA: Insufficient documentation

## 2021-12-23 DIAGNOSIS — D631 Anemia in chronic kidney disease: Secondary | ICD-10-CM | POA: Insufficient documentation

## 2022-01-10 ENCOUNTER — Encounter: Payer: Self-pay | Admitting: Internal Medicine

## 2022-01-17 ENCOUNTER — Other Ambulatory Visit (HOSPITAL_COMMUNITY): Payer: Self-pay

## 2022-01-19 ENCOUNTER — Ambulatory Visit: Payer: Medicaid Other | Admitting: Internal Medicine

## 2022-01-21 ENCOUNTER — Other Ambulatory Visit: Payer: Self-pay | Admitting: Internal Medicine

## 2022-01-22 ENCOUNTER — Other Ambulatory Visit (HOSPITAL_COMMUNITY): Payer: Self-pay

## 2022-01-22 MED ORDER — OMNIPOD 5 DEXG7G6 INTRO GEN 5 KIT
PACK | 0 refills | Status: DC
  Filled 2022-01-22: qty 1, 30d supply, fill #0

## 2022-01-23 ENCOUNTER — Other Ambulatory Visit (HOSPITAL_COMMUNITY): Payer: Self-pay

## 2022-02-01 ENCOUNTER — Other Ambulatory Visit (HOSPITAL_COMMUNITY): Payer: Self-pay

## 2022-02-02 ENCOUNTER — Other Ambulatory Visit (HOSPITAL_COMMUNITY): Payer: Self-pay

## 2022-02-17 ENCOUNTER — Other Ambulatory Visit (HOSPITAL_COMMUNITY): Payer: Self-pay

## 2022-02-23 ENCOUNTER — Ambulatory Visit
Admission: RE | Admit: 2022-02-23 | Discharge: 2022-02-23 | Disposition: A | Discharge status: Home / Self Care | Payer: Medicaid Other | Source: Ambulatory Visit | Attending: Nephrology | Admitting: Nephrology

## 2022-02-23 DIAGNOSIS — N1832 Chronic kidney disease, stage 3b: Secondary | ICD-10-CM

## 2022-03-07 ENCOUNTER — Other Ambulatory Visit (HOSPITAL_COMMUNITY): Payer: Self-pay

## 2022-03-08 ENCOUNTER — Other Ambulatory Visit (HOSPITAL_COMMUNITY): Payer: Self-pay

## 2022-03-09 ENCOUNTER — Other Ambulatory Visit (HOSPITAL_COMMUNITY): Payer: Self-pay

## 2022-03-15 ENCOUNTER — Encounter: Payer: Self-pay | Admitting: Internal Medicine

## 2022-03-15 ENCOUNTER — Ambulatory Visit (INDEPENDENT_AMBULATORY_CARE_PROVIDER_SITE_OTHER): Payer: Medicaid Other | Admitting: Internal Medicine

## 2022-03-15 VITALS — BP 140/82 | HR 80 | Ht 63.0 in | Wt 131.0 lb

## 2022-03-15 DIAGNOSIS — E1022 Type 1 diabetes mellitus with diabetic chronic kidney disease: Secondary | ICD-10-CM | POA: Diagnosis not present

## 2022-03-15 DIAGNOSIS — Z992 Dependence on renal dialysis: Secondary | ICD-10-CM | POA: Diagnosis not present

## 2022-03-15 DIAGNOSIS — N186 End stage renal disease: Secondary | ICD-10-CM

## 2022-03-15 DIAGNOSIS — E1065 Type 1 diabetes mellitus with hyperglycemia: Secondary | ICD-10-CM

## 2022-03-15 LAB — POCT GLYCOSYLATED HEMOGLOBIN (HGB A1C): Hemoglobin A1C: 8.5 % — AB (ref 4.0–5.6)

## 2022-03-15 LAB — POCT GLUCOSE (DEVICE FOR HOME USE): POC Glucose: 144 mg/dl — AB (ref 70–99)

## 2022-03-15 MED ORDER — DEXCOM G7 SENSOR MISC: 1.0000 | 3 refills | Status: DC

## 2022-03-15 NOTE — Patient Instructions (Addendum)
HOW TO TREAT LOW BLOOD SUGARS (Blood sugar LESS THAN 70 MG/DL) Please follow the RULE OF 15 for the treatment of hypoglycemia treatment (when your (blood sugars are less than 70 mg/dL)   STEP 1: Take 15 grams of carbohydrates when your blood sugar is low, which includes:  3-4 GLUCOSE TABS  OR 3-4 OZ OF JUICE OR REGULAR SODA OR ONE TUBE OF GLUCOSE GEL    STEP 2: RECHECK blood sugar in 15 MINUTES STEP 3: If your blood sugar is still low at the 15 minute recheck --> then, go back to STEP 1 and treat AGAIN with another 15 grams of carbohydrates.  

## 2022-03-15 NOTE — Progress Notes (Signed)
Name: Ashley Freeman  Age/ Sex: 31 y.o., female   MRN/ DOB: [id number], 08/27/1990     PCP: Pcp, No   Reason for Endocrinology Evaluation: Type 2 Diabetes Mellitus  Initial Endocrine Consultative Visit: 06/27/18    PATIENT IDENTIFIER: Ashley Freeman is a 31 y.o. 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 The patient has followed with Endocrinology clinic since 06/27/2018 for consultative assistance with management of her diabetes.  DIABETIC HISTORY:  Ashley Freeman was diagnosed with T1DM at age 76. Hx of multiple DKA's due to non-compliance. Her hemoglobin A1c has ranged from  in 11.3% in 2011, peaking at 19.3% in 2015.  S/P C-section 10/2019 ( Ashley Freeman)  SUBJECTIVE:   During the last visit (05/24/2021): A1c 9.4 % .  We adjusted pump settings    Today (03/15/2022): Ashley Freeman is here for a follow up on diabetes management .  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 She was admitted for DKA November 06, 2021   Denies nausea, or vomiting  Denies constipation but has diarrhea    HOME DIABETES REGIMEN:   Novolog    Pump   Omnipod Settings   Insulin type   NOVOLOG     Basal rate          0000 0.5                     I:C ratio          0000 12                       AIT  4 hrs            Sensitivity          0000  50           Goal          0000  130                 CONTINUOUS GLUCOSE MONITORING RECORD INTERPRETATION    Dates of  Recording: 11/2-11/15/2023  Sensor description: dexcom  Results statistics:   CGM use % of time 0  Average and SD 195/81  Time in range       53 %  % Time Above 180 25  % Time above 250 21  % Time Below target <1   Glycemic patterns summary: Sporadic data , BG's acceptable at night but rise during the day with the worse readings later in the evening  Hyperglycemic episodes  during the day   Hypoglycemic episodes occurred after a bolus  Overnight periods: trends down       DIABETIC COMPLICATIONS: Microvascular complications:  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 Denies: neuropathy, retinopathy, CKD Last eye exam: Completed 03/05/2022   Macrovascular complications:    Denies: CAD, PVD, CVA  HISTORY:  Past Medical History:  Past Medical History:  Diagnosis Date   Abscess, gluteal, right 08/24/2013   AKI (acute kidney injury) ([facility]) 07/26/2014  Anemia 02/19/2012   Bartholin's gland abscess 09/19/2013   BV (bacterial vaginosis) 11/24/2015   Diabetes mellitus type I ([facility]) 2001   Diagnosed at age 75 ; Type I   Diarrhea 05/30/2016   DKA (diabetic ketoacidoses) 08/19/2013   Also in 2018   Gonorrhea 08/2011   Treated in 09/2011   History of trichomoniasis 05/31/2016   Hyperlipidemia 03/28/2016   Sepsis ([facility]) 09/19/2013   Past Surgical History:  Past Surgical History:  Procedure Laterality Date   CESAREAN SECTION N/A 10/05/2019   Procedure: CESAREAN SECTION;  Surgeon: Seaside Bing, MD;  [facility];  Service: Obstetrics;  Laterality: N/A;   INCISION AND DRAINAGE ABSCESS Left 09/28/2019   Procedure: INCISION AND DRAINAGE VULVAR ABCESS;  Surgeon: Tilda Burrow, MD;  Location: [facility];  Service: Gynecology;  Laterality: Left;   INCISION AND DRAINAGE PERIRECTAL ABSCESS Right 08/18/2013   Procedure: IRRIGATION AND DEBRIDEMENT GLUTEAL ABSCESS;  Surgeon: Axel Filler, MD;  [facility];  Service: General;  Laterality: Right;   INCISION AND DRAINAGE PERIRECTAL ABSCESS Right 09/19/2013    Procedure: IRRIGATION AND DEBRIDEMENT RIGHT GLUTEAL AND LABIAL ABSCESSES;  Surgeon: Axel Filler, MD;  [facility];  Service: General;  Laterality: Right;   INCISION AND DRAINAGE PERIRECTAL ABSCESS Right 09/24/2013   Procedure: IRRIGATION AND DEBRIDEMENT PERIRECTAL ABSCESS;  Surgeon: Cherylynn Ridges, MD;  [facility];  Service: General;  Laterality: Right;   Social History:  reports that she has never smoked. She has never used smokeless tobacco. She reports that she does not currently use alcohol. She reports that she does not use drugs. Family History:  Family History  Problem Relation Age of Onset   Asthma Mother    Carpal tunnel syndrome Mother    Gout Father    Diabetes Paternal Grandmother    Anesthesia problems Neg Hx      HOME MEDICATIONS: Allergies as of 03/15/2022       Reactions   Cephalexin Anaphylaxis   Has gotten ceftriaxone in the past    Penicillins Hives, Rash   Has patient had a PCN reaction causing immediate rash, facial/tongue/throat swelling, SOB or lightheadedness with hypotension: Yes Has patient had a PCN reaction causing severe rash involving mucus membranes or skin necrosis: No Has patient had a PCN reaction that required hospitalization: Yes Has patient had a PCN reaction occurring within the last 10 years: No Spoke with pt - childhood hives told by mom, tried no pcns since, doesn't remember reaction herself    Benadryl [diphenhydramine] Itching   Doxycycline Itching        Medication List        Accurate as of March 15, 2022  7:12 AM. If you have any questions, ask your nurse or doctor.          acetaminophen 500 MG tablet Commonly known as: TYLENOL Take 1,000 mg by mouth every 6 (six) hours as needed for headache (pain).   amLODipine 10 MG tablet Commonly known as: NORVASC Take 1 tablet (10 mg total) by mouth daily.   blood glucose meter kit and supplies Kit Dispense based on patient and insurance preference. Use up to four  times daily as directed. (FOR ICD-9 250.00, 250.01).   Blood Pressure Kit Devi 1 Device by Does not apply route as needed.   carvedilol 25 MG tablet Commonly known as: COREG Take 1 tablet (25 mg total) by mouth 2 (two) times daily.   Dexcom G6 Sensor Misc Inject 1 Device into the  skin as directed.   Dexcom G6 Transmitter Misc Inject 1 Device into the skin as directed. Use to check blood sugar daily   insulin aspart 100 UNIT/ML injection Commonly known as: novoLOG Max daily 50 units .Uses insulin pump What changed:  when to take this additional instructions   lurasidone 40 MG Tabs tablet Commonly known as: LATUDA Take 40 mg by mouth daily with breakfast.   Omnipod 5 G6 Pod (Gen 5) Misc 1 Device by Does not apply route every 3 (three) days.   Omnipod 5 G6 Intro (Gen 5) Kit Change pods every 3 days   Pentips 32G X 4 MM Misc Generic drug: Insulin Pen Needle Use as directed   prenatal multivitamin Tabs tablet Take 1 tablet by mouth daily at 12 noon.         OBJECTIVE:   PHYSICAL EXAM: VS:BP (!) 140/82 (BP Location: Left Arm, Patient Position: Sitting, Cuff Size: Small)   Pulse 80   Ht 5\' 3"  (1.6 m)   Wt 131 lb (59.4 kg)   SpO2 98%   BMI 23.21 kg/m    EXAM: General: Pt appears well and is in NAD  Lungs: Clear with good BS bilat with no rales, rhonchi, or wheezes  Heart: RRR  Abdomen:   soft and non-tender  Extremities: No pretibial edema   Mental Status: Judgment, insight: intact Mood and affect: no depression, anxiety, or agitation     DM foot exam: 11/23/2021 The skin of the feet is intact without sores or ulcerations. The pedal pulses are 2+ on right and 2+ on left. The sensation is intact to a screening 5.07, 10 gram monofilament bilaterally     DATA REVIEWED:  Lab Results  Component Value Date   HGBA1C 9.1 (H) 11/09/2021   HGBA1C 8.0 (H) 07/26/2021   HGBA1C 8.3 (A) 05/24/2021   02/26/2022 A1c 9.2%  TG 267 LDL 84 HDL 39 PTH  266.3  ASSESSMENT / PLAN / RECOMMENDATIONS:   1) Type 1 Diabetes Mellitus, Poorly controlled, With retinopathic and CKD IV complications- Most recent A1c of 8.5 %. Goal A1c < 7.0 %.    -Patient continues with hyperglycemia, unfortunately she came today without the pump PDM, and we were unable to download her information -The patient stated that she will come the next day and bring the pump but as of Friday 03/16/2022 at 2 pm  she did not bring the PDM -She is currently on peritoneal dialysis and has noted hyperglycemia when using the dialyzate, patient understands she will need pump adjustments and it is important to have the PDM to make the adjustments necessary -She also continues with intermittent use of CGM -Unfortunately its been hard to motivate her to improve her glycemic control, but I am hoping getting on the transplant list will be a good motivation   MEDICATIONS:  Novolog     Pump   Omnipod Settings   Insulin type   NOVOLOG     Basal rate          0000 0.5                     I:C ratio          0000 12                       AIT  4 hrs            Sensitivity  0000  50           Goal          0000  130                     EDUCATION / INSTRUCTIONS: BG monitoring instructions: Patient is instructed to check her blood sugars 4 times a day, before meals and bedtime  Call [place] Endocr[facility] clinic if: BG persistently < 70 I reviewed the Rule of 15 for the treatment of hypoglycemia in detail with the patient. Literature supplied.   2) Diabetic complications:  Eye: Does not have known diabetic retinopathy.  Neuro/ Feet: Does not have known diabetic peripheral neuropathy. Renal: Patient does not have known baseline CKD. She is not on an ACEI/ARB at present.but has microalbuminuria, has a pending referral to nephrology per pt.       F/U in 4 months      Signed electronically by: Lyndle Herrlich, MD  [facility] Endocrinology  [facility] [address] [place], [place] [address] Phone: [phone] FAX: [phone]   CC: Pcp, No No address on file Phone: None  Fax: None  Return to Endocrinology clinic as below: Future Appointments  Date Time Provider Department Center  03/15/2022 11:30 AM Candita Borenstein, Konrad Dolores, MD [id number] None

## 2022-03-16 ENCOUNTER — Encounter: Payer: Self-pay | Admitting: Internal Medicine

## 2022-03-19 ENCOUNTER — Other Ambulatory Visit (HOSPITAL_COMMUNITY): Payer: Self-pay

## 2022-03-31 DIAGNOSIS — Z59819 Housing instability, housed unspecified: Secondary | ICD-10-CM | POA: Insufficient documentation

## 2022-03-31 DIAGNOSIS — Z560 Unemployment, unspecified: Secondary | ICD-10-CM | POA: Insufficient documentation

## 2022-04-12 ENCOUNTER — Other Ambulatory Visit: Payer: Self-pay | Admitting: Internal Medicine

## 2022-04-16 ENCOUNTER — Telehealth: Payer: Self-pay

## 2022-04-16 ENCOUNTER — Other Ambulatory Visit (HOSPITAL_COMMUNITY): Payer: Self-pay

## 2022-04-16 NOTE — Telephone Encounter (Signed)
Patient Advocate Encounter   Received notification from [facility] that prior authorization is required for [facility] G7 Sensor  Submitted: 04-16-2022 Key  [id number]   Status is pending

## 2022-04-17 ENCOUNTER — Other Ambulatory Visit (HOSPITAL_COMMUNITY): Payer: Self-pay

## 2022-04-17 NOTE — Telephone Encounter (Signed)
Pharmacy Patient Advocate Encounter  Prior Authorization for [place] has been approved.    Effective dates: 04/16/22 through 10/13/22  Karie Soda, [place] Patient Advocate Specialist Direct Number: [phone] Fax: [phone]

## 2022-04-18 ENCOUNTER — Telehealth (INDEPENDENT_AMBULATORY_CARE_PROVIDER_SITE_OTHER): Payer: Self-pay

## 2022-04-18 NOTE — Telephone Encounter (Signed)
I have attempted to contact the patient using all numbers for her and her mother's numbers as well. They are either no in service or will not receive a call.

## 2022-05-02 ENCOUNTER — Encounter: Payer: Self-pay | Admitting: Internal Medicine

## 2022-05-02 ENCOUNTER — Ambulatory Visit: Payer: Medicaid Other | Admitting: Internal Medicine

## 2022-05-02 ENCOUNTER — Other Ambulatory Visit: Payer: Self-pay

## 2022-05-02 MED ORDER — DEXCOM G6 TRANSMITTER MISC: 1.0000 | 3 refills | Status: DC

## 2022-05-02 MED ORDER — DEXCOM G6 SENSOR MISC: 3 refills | Status: DC

## 2022-05-02 NOTE — Telephone Encounter (Signed)
Patient was contacted to reschedule to soonest appointment on 03

## 2022-05-04 ENCOUNTER — Other Ambulatory Visit: Payer: Self-pay | Admitting: *Deleted

## 2022-05-04 DIAGNOSIS — E1022 Type 1 diabetes mellitus with diabetic chronic kidney disease: Secondary | ICD-10-CM

## 2022-05-07 ENCOUNTER — Ambulatory Visit (HOSPITAL_COMMUNITY)
Admission: RE | Admit: 2022-05-07 | Discharge: 2022-05-07 | Disposition: A | Discharge status: Home / Self Care | Payer: Medicaid Other | Source: Ambulatory Visit | Attending: Surgery | Admitting: Surgery

## 2022-05-07 ENCOUNTER — Ambulatory Visit (INDEPENDENT_AMBULATORY_CARE_PROVIDER_SITE_OTHER)
Admission: RE | Admit: 2022-05-07 | Discharge: 2022-05-07 | Disposition: A | Discharge status: Home / Self Care | Payer: Medicaid Other | Source: Ambulatory Visit | Attending: Surgery | Admitting: Surgery

## 2022-05-07 DIAGNOSIS — N184 Chronic kidney disease, stage 4 (severe): Secondary | ICD-10-CM | POA: Insufficient documentation

## 2022-05-07 DIAGNOSIS — E1022 Type 1 diabetes mellitus with diabetic chronic kidney disease: Secondary | ICD-10-CM | POA: Diagnosis present

## 2022-05-09 ENCOUNTER — Ambulatory Visit (INDEPENDENT_AMBULATORY_CARE_PROVIDER_SITE_OTHER): Payer: Medicaid Other | Admitting: Vascular Surgery

## 2022-05-09 ENCOUNTER — Encounter: Payer: Self-pay | Admitting: Vascular Surgery

## 2022-05-09 VITALS — BP 131/89 | HR 88 | Temp 97.9°F | Resp 20 | Ht 63.0 in | Wt 131.0 lb

## 2022-05-09 DIAGNOSIS — N186 End stage renal disease: Secondary | ICD-10-CM | POA: Diagnosis not present

## 2022-05-09 NOTE — Progress Notes (Signed)
Patient ID: Ashley Freeman, female   DOB: 04-Jan-1991, 32 y.o.   MRN: [id number]  Reason for Consult: No chief complaint on file.   Referred by Anthonette Legato, MD  Subjective:     HPI:  Ashley Freeman is a 32 y.o. female with history of end-stage renal disease on dialysis via right IJ tunneled dialysis catheter on Tuesdays, Thursdays and Saturdays.  She does not take any blood thinners.  She is left-hand dominant no previous right arm surgeries.  Past Medical History:  Diagnosis Date   Abscess, gluteal, right 08/24/2013   AKI (acute kidney injury) ([place]) 07/26/2014   Anemia 02/19/2012   Bartholin's gland abscess 09/19/2013   BV (bacterial vaginosis) 11/24/2015   Diabetes mellitus type I ([place]) 2001   Diagnosed at age 86 ; Type I   Diarrhea 05/30/2016   DKA (diabetic ketoacidoses) 08/19/2013   Also in 2018   Gonorrhea 08/2011   Treated in 09/2011   History of trichomoniasis 05/31/2016   Hyperlipidemia 03/28/2016   Sepsis ([place]) 09/19/2013   Family History  Problem Relation Age of Onset   Asthma Mother    Carpal tunnel syndrome Mother    Gout Father    Diabetes Paternal Grandmother    Anesthesia problems Neg Hx    Past Surgical History:  Procedure Laterality Date   CESAREAN SECTION N/A 10/05/2019   Procedure: CESAREAN SECTION;  Surgeon: Aletha Halim, MD;  [facility];  Service: Obstetrics;  Laterality: N/A;   INCISION AND DRAINAGE ABSCESS Left 09/28/2019   Procedure: INCISION AND DRAINAGE VULVAR ABCESS;  Surgeon: [name], MD;  Location: [facility];  Service: Gynecology;  Laterality: Left;   INCISION AND DRAINAGE PERIRECTAL ABSCESS Right 08/18/2013   Procedure: IRRIGATION AND DEBRIDEMENT GLUTEAL ABSCESS;  Surgeon: Ralene Ok, MD;  Location: [facility];  Service: General;  Laterality: Right;   INCISION AND DRAINAGE PERIRECTAL ABSCESS Right 09/19/2013   Procedure: IRRIGATION AND DEBRIDEMENT RIGHT GLUTEAL AND LABIAL ABSCESSES;  Surgeon: Ralene Ok, MD;  Location: [facility];  Service: General;  Laterality: Right;   INCISION AND DRAINAGE PERIRECTAL ABSCESS Right 09/24/2013   Procedure: IRRIGATION AND DEBRIDEMENT PERIRECTAL ABSCESS;  Surgeon: Gwenyth Ober, MD;  Location: [facility];  Service: General;  Laterality: Right;    Short Social History:  Social History   Tobacco Use   Smoking status: Never   Smokeless tobacco: Never  Substance Use Topics   Alcohol use: Not Currently    Allergies  Allergen Reactions   Cephalexin Anaphylaxis    Has gotten ceftriaxone in the past    Penicillins Hives and Rash    Has patient had a PCN reaction causing immediate rash, facial/tongue/throat swelling, SOB or lightheadedness with hypotension: Yes Has patient had a PCN reaction causing severe rash involving mucus membranes or skin necrosis: No Has patient had a PCN reaction that required hospitalization: Yes Has patient had a PCN reaction occurring within the last 10 years: No Spoke with pt - childhood hives told by mom, tried no pcns since, doesn't remember reaction herself    Benadryl [Diphenhydramine] Itching   Doxycycline Itching    Current Outpatient Medications  Medication Sig Dispense Refill   Continuous Blood Gluc Sensor (DEXCOM G6 SENSOR) MISC Change sensors every 10 days 9 each 3   acetaminophen (TYLENOL) 500 MG tablet Take 1,000 mg by mouth every 6 (six) hours as needed for headache (pain).     amLODipine (NORVASC) 10 MG tablet Take 1 tablet (10 mg total) by  mouth daily. 30 tablet 2   blood glucose meter kit and supplies KIT Dispense based on patient and insurance preference. Use up to four times daily as directed. (FOR ICD-9 250.00, 250.01). 1 each 0   Blood Pressure Monitoring (BLOOD PRESSURE KIT) DEVI 1 Device by Does not apply route as needed. 1 each 0   carvedilol (COREG) 25 MG tablet Take 1 tablet (25 mg total) by mouth 2 (two) times daily. 60 tablet 2   Continuous Blood Gluc Transmit (DEXCOM G6 TRANSMITTER) MISC Inject 1  Device into the skin as directed. Use to check blood sugar daily 1 each 3   gentamicin cream (GARAMYCIN) 0.1 % SMARTSIG:1 Topical Daily     insulin aspart (NOVOLOG) 100 UNIT/ML injection Max daily 50 units .Uses insulin pump (Patient taking differently: See admin instructions. Max daily 50 units .Use with Omnipod Insulin pump) 50 mL 3   Insulin Disposable Pump (OMNIPOD 5 G6 INTRO, GEN 5,) KIT Change pods every 3 days 1 kit 0   Insulin Disposable Pump (OMNIPOD 5 G6 POD, GEN 5,) MISC CHANGE EVERY 3 DAYS 30 each 1   Insulin Pen Needle 32G X 4 MM MISC Use as directed 200 each 0   lurasidone (LATUDA) 40 MG TABS tablet Take 40 mg by mouth daily with breakfast.     Prenatal Vit-Fe Fumarate-FA (PRENATAL MULTIVITAMIN) TABS tablet Take 1 tablet by mouth daily at 12 noon.     No current facility-administered medications for this visit.    Review of Systems  Constitutional:  Constitutional negative. HENT: HENT negative.  Eyes: Eyes negative.  Respiratory: Respiratory negative.  Cardiovascular: Cardiovascular negative.  GI: Gastrointestinal negative.  Musculoskeletal: Musculoskeletal negative.  Skin: Skin negative.  Neurological: Neurological negative. Hematologic: Hematologic/lymphatic negative.  Psychiatric: Psychiatric negative.        Objective:  Objective  Vitals:   05/09/22 1326  BP: 131/89  Pulse: 88  Resp: 20  Temp: 97.9 F (36.6 C)  SpO2: 95%    Physical Exam HENT:     Head: Normocephalic.     Nose: Nose normal.     Mouth/Throat:     Mouth: Mucous membranes are moist.  Eyes:     Pupils: Pupils are equal, round, and reactive to light.  Cardiovascular:     Rate and Rhythm: Normal rate.  Pulmonary:     Effort: Pulmonary effort is normal.  Abdominal:     General: Abdomen is flat.     Palpations: Abdomen is soft.  Musculoskeletal:        General: Normal range of motion.     Cervical back: Normal range of motion and neck supple.     Right lower leg: No edema.     Left  lower leg: No edema.  Skin:    General: Skin is warm.     Capillary Refill: Capillary refill takes less than 2 seconds.  Neurological:     General: No focal deficit present.     Mental Status: She is alert.  Psychiatric:        Mood and Affect: Mood normal.        Behavior: Behavior normal.        Thought Content: Thought content normal.        Judgment: Judgment normal.     Data: Right Pre-Dialysis Findings:  +-----------------------+----------+--------------------+---------+--------  +  Location              PSV (cm/s)Intralum. Diam. (cm)Waveform  Comments  +-----------------------+----------+--------------------+---------+--------  +  Brachial Antecub. fossa57  0.45                triphasic           +-----------------------+----------+--------------------+---------+--------  +  Radial Art at Wrist    71        0.20                triphasic           +-----------------------+----------+--------------------+---------+--------  +  Ulnar Art at Wrist     63        0.15                triphasic           +-----------------------+----------+--------------------+---------+--------    Left Pre-Dialysis Findings:  +----------------------------+---------+-------------------+----------+----  ----+  Location                   PSV      Intralum. Diam.    Waveform   Comments                             (cm/s)   (cm)                                    +----------------------------+---------+-------------------+----------+----  ----+  Radial artery distal upper  58       0.3                triphasic            arm                                                                          +----------------------------+---------+-------------------+----------+----  ----+  Ulnar artery distal upper   52       0.3                triphasic            arm                                                                           +----------------------------+---------+-------------------+----------+----  ----+  Radial Art at Wrist         70       0.24               triphasic            +----------------------------+---------+-------------------+----------+----  ----+  Ulnar Art at Wrist          67       0.16               triphasic              Summary:    Right: Patent brachial, radial, and ulnar arteries.   Left: Patent radial and ulnar arteries. Bifurcation in the proximal upper arm.  Right Cephalic   Diameter (cm)Depth (cm)    Findings      +-----------------+-------------+----------+----------------+  Shoulder            0.22                                 +-----------------+-------------+----------+----------------+  Prox upper arm       0.23                                 +-----------------+-------------+----------+----------------+  Mid upper arm        0.14                                 +-----------------+-------------+----------+----------------+  Dist upper arm       0.13                                 +-----------------+-------------+----------+----------------+  Antecubital fossa    0.17                                 +-----------------+-------------+----------+----------------+  Prox forearm                            chronic thrombus  +-----------------+-------------+----------+----------------+  Dist forearm         0.13                not visualized   +-----------------+-------------+----------+----------------+   +-----------------+-------------+----------+--------+  Right Basilic    Diameter (cm)Depth (cm)Findings  +-----------------+-------------+----------+--------+  Mid upper arm        0.29                         +-----------------+-------------+----------+--------+  Dist upper arm       0.17                         +-----------------+-------------+----------+--------+  Antecubital fossa     0.14                         +-----------------+-------------+----------+--------+   +-----------------+-------------+----------+--------+  Left Cephalic    Diameter (cm)Depth (cm)Findings  +-----------------+-------------+----------+--------+  Shoulder            0.19                         +-----------------+-------------+----------+--------+  Prox upper arm       0.20                         +-----------------+-------------+----------+--------+  Mid upper arm        0.18                         +-----------------+-------------+----------+--------+  Dist upper arm       0.09                         +-----------------+-------------+----------+--------+  Antecubital fossa    0.10                         +-----------------+-------------+----------+--------+  Prox forearm         0.17                         +-----------------+-------------+----------+--------+  Mid forearm          0.10                         +-----------------+-------------+----------+--------+  Dist forearm         0.10                         +-----------------+-------------+----------+--------+   +-----------------+-------------+----------+--------------+  Left Basilic     Diameter (cm)Depth (cm)   Findings     +-----------------+-------------+----------+--------------+  Mid upper arm        0.20                               +-----------------+-------------+----------+--------------+  Dist upper arm       0.22                               +-----------------+-------------+----------+--------------+  Antecubital fossa                       not visualized  +-----------------+-------------+----------+--------------+   Summary:  Right: Patent small cephalic vein to the proximal forearm. Patent small basilic vein.  Left: Patent small cephalic and basilic veins.       Assessment/Plan:  32 year old female with end-stage renal disease currently  dialyzing via catheter.  She does not appear to have suitable surface veins on either extremity and she is left-hand dominant.  Will plan for AV fistula versus graft in the right upper extremity on a nondialysis day in the near future.  We discussed the risk benefits alternatives he demonstrates good understanding.     Waynetta Sandy MD [facility]

## 2022-05-10 ENCOUNTER — Other Ambulatory Visit: Payer: Self-pay

## 2022-05-10 DIAGNOSIS — N186 End stage renal disease: Secondary | ICD-10-CM

## 2022-05-21 ENCOUNTER — Ambulatory Visit: Payer: Medicaid Other | Admitting: Internal Medicine

## 2022-05-21 NOTE — Progress Notes (Deleted)
Name: Ashley Freeman  Age/ Sex: 32 y.o., female   MRN/ DOB: [id number], 1991/02/11     PCP: Pcp, No   Reason for Endocrinology Evaluation: Type 2 Diabetes Mellitus  Initial Endocrine Consultative Visit: 06/27/18    PATIENT IDENTIFIER: [name] is a 32 y.o. 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 The patient has followed with Endocrinology clinic since 06/27/2018 for consultative assistance with management of her diabetes.  DIABETIC HISTORY:  Ashley Freeman was diagnosed with T1DM at age 64. Hx of multiple DKA's due to non-compliance. Her hemoglobin A1c has ranged from  in 11.3% in 2011, peaking at 19.3% in 2015.  S/P C-section 10/2019 ( Ashley Freeman)  SUBJECTIVE:   During the last visit (05/24/2021): A1c 9.4 % .  We adjusted pump settings    Today (05/21/2022): Ashley Freeman is here for a follow up on diabetes management .  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 She was admitted for DKA November 06, 2021   Denies nausea, or vomiting  Denies constipation but has diarrhea    HOME DIABETES REGIMEN:   Novolog        Pump   Omnipod Settings   Insulin type   NOVOLOG     Basal rate          0000 0.5                     I:C ratio          0000 12                       AIT  4 hrs            Sensitivity          0000  50           Goal          0000  130                CONTINUOUS GLUCOSE MONITORING RECORD INTERPRETATION    Dates of  Recording: 11/2-11/15/2023  Sensor description: dexcom  Results statistics:   CGM use % of time 0  Average and SD 195/81  Time in range       53 %  % Time Above 180 25  % Time above 250 21  % Time Below target <1   Glycemic patterns summary: Sporadic data , BG's acceptable at night but rise during the day with the worse readings later in the evening  Hyperglycemic episodes  during the day   Hypoglycemic episodes occurred after a bolus  Overnight periods: trends down       DIABETIC COMPLICATIONS: Microvascular complications:  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 Denies: neuropathy, retinopathy, CKD Last eye exam: Completed 03/05/2022   Macrovascular complications:    Denies: CAD, PVD, CVA  HISTORY:  Past Medical History:  Past Medical History:  Diagnosis Date   Abscess, gluteal, right 08/24/2013   AKI (acute kidney injury) (  [place]) 07/26/2014   Anemia 02/19/2012   Bartholin's gland abscess 09/19/2013   BV (bacterial vaginosis) 11/24/2015   Diabetes mellitus type I ([place]) 2001   Diagnosed at age 66 ; Type I   Diarrhea 05/30/2016   DKA (diabetic ketoacidoses) 08/19/2013   Also in 2018   Gonorrhea 08/2011   Treated in 09/2011   History of trichomoniasis 05/31/2016   Hyperlipidemia 03/28/2016   Sepsis ([place]) 09/19/2013   Past Surgical History:  Past Surgical History:  Procedure Laterality Date   CESAREAN SECTION N/A 10/05/2019   Procedure: CESAREAN SECTION;  Surgeon: Aletha Halim, MD;  [facility];  Service: Obstetrics;  Laterality: N/A;   INCISION AND DRAINAGE ABSCESS Left 09/28/2019   Procedure: INCISION AND DRAINAGE VULVAR ABCESS;  Surgeon: [name], MD;  Location: [facility];  Service: Gynecology;  Laterality: Left;   INCISION AND DRAINAGE PERIRECTAL ABSCESS Right 08/18/2013   Procedure: IRRIGATION AND DEBRIDEMENT GLUTEAL ABSCESS;  Surgeon: Ralene Ok, MD;  Location: [facility];  Service: General;  Laterality: Right;   INCISION AND DRAINAGE PERIRECTAL ABSCESS Right 09/19/2013    Procedure: IRRIGATION AND DEBRIDEMENT RIGHT GLUTEAL AND LABIAL ABSCESSES;  Surgeon: Ralene Ok, MD;  Location: [facility];  Service: General;  Laterality: Right;   INCISION AND DRAINAGE PERIRECTAL ABSCESS Right 09/24/2013   Procedure: IRRIGATION AND DEBRIDEMENT PERIRECTAL ABSCESS;  Surgeon: Gwenyth Ober, MD;  Location: [facility];  Service: General;  Laterality: Right;   Social History:  reports that she has never smoked. She has never used smokeless tobacco. She reports that she does not currently use alcohol. She reports that she does not use drugs. Family History:  Family History  Problem Relation Age of Onset   Asthma Mother    Carpal tunnel syndrome Mother    Gout Father    Diabetes Paternal Grandmother    Anesthesia problems Neg Hx      HOME MEDICATIONS: Allergies as of 05/21/2022       Reactions   Cephalexin Anaphylaxis   Has gotten ceftriaxone in the past    Penicillins Hives, Rash   Has patient had a PCN reaction causing immediate rash, facial/tongue/throat swelling, SOB or lightheadedness with hypotension: Yes Has patient had a PCN reaction causing severe rash involving mucus membranes or skin necrosis: No Has patient had a PCN reaction that required hospitalization: Yes Has patient had a PCN reaction occurring within the last 10 years: No Spoke with pt - childhood hives told by mom, tried no pcns since, doesn't remember reaction herself    Benadryl [diphenhydramine] Itching   Doxycycline Itching        Medication List        Accurate as of May 21, 2022  7:01 AM. If you have any questions, ask your nurse or doctor.          acetaminophen 500 MG tablet Commonly known as: TYLENOL Take 1,000 mg by mouth every 6 (six) hours as needed for headache (pain).   amLODipine 10 MG tablet Commonly known as: NORVASC Take 1 tablet (10 mg total) by mouth daily.   blood glucose meter kit and supplies Kit Dispense based on patient and insurance preference. Use up to four  times daily as directed. (FOR ICD-9 250.00, 250.01).   Blood Pressure Kit Devi 1 Device by Does not apply route as needed.   carvedilol 25 MG tablet Commonly known as: COREG Take 1 tablet (25 mg total) by mouth 2 (two) times daily.   Dexcom G6 Sensor Misc Change  sensors every 10 days   Dexcom G6 Transmitter Misc Inject 1 Device into the skin as directed. Use to check blood sugar daily   gentamicin cream 0.1 % Commonly known as: GARAMYCIN SMARTSIG:1 Topical Daily   insulin aspart 100 UNIT/ML injection Commonly known as: novoLOG Max daily 50 units .Uses insulin pump What changed:  when to take this additional instructions   lurasidone 40 MG Tabs tablet Commonly known as: LATUDA Take 40 mg by mouth daily with breakfast.   Omnipod 5 G6 Intro (Gen 5) Kit Change pods every 3 days   Omnipod 5 G6 Pod (Gen 5) Misc CHANGE EVERY 3 DAYS   Pentips 32G X 4 MM Misc Generic drug: Insulin Pen Needle Use as directed   prenatal multivitamin Tabs tablet Take 1 tablet by mouth daily at 12 noon.         OBJECTIVE:   PHYSICAL EXAM: [id number] were no vitals taken for this visit.   EXAM: General: Pt appears well and is in NAD  Lungs: Clear with good BS bilat with no rales, rhonchi, or wheezes  Heart: RRR  Abdomen:   soft and non-tender  Extremities: No pretibial edema   Mental Status: Judgment, insight: intact Mood and affect: no depression, anxiety, or agitation     DM foot exam: 11/23/2021 The skin of the feet is intact without sores or ulcerations. The pedal pulses are 2+ on right and 2+ on left. The sensation is intact to a screening 5.07, 10 gram monofilament bilaterally     DATA REVIEWED:  Lab Results  Component Value Date   HGBA1C 8.5 (A) 03/15/2022   HGBA1C 9.1 (H) 11/09/2021   HGBA1C 8.0 (H) 07/26/2021   02/26/2022 A1c 9.2%  TG 267 LDL 84 HDL 39 PTH 266.3  ASSESSMENT / PLAN / RECOMMENDATIONS:   1) Type 1 Diabetes Mellitus, Poorly controlled, With  retinopathic and CKD IV complications- Most recent A1c of 8.5 %. Goal A1c < 7.0 %.    -Patient continues with hyperglycemia, unfortunately she came today without the pump PDM, and we were unable to download her information -The patient stated that she will come the next day and bring the pump but as of Friday 03/16/2022 at 2 pm  she did not bring the PDM -She is currently on peritoneal dialysis and has noted hyperglycemia when using the dialyzate, patient understands she will need pump adjustments and it is important to have the PDM to make the adjustments necessary -She also continues with intermittent use of CGM -Unfortunately its been hard to motivate her to improve her glycemic control, but I am hoping getting on the transplant list will be a good motivation   MEDICATIONS:  Novolog     Pump   Omnipod Settings   Insulin type   NOVOLOG     Basal rate          0000 0.5                     I:C ratio          0000 12                       AIT  4 hrs            Sensitivity          0000  50           Goal          0000  130  EDUCATION / INSTRUCTIONS: BG monitoring instructions: Patient is instructed to check her blood sugars 4 times a day, before meals and bedtime  Call [facility] if: BG persistently < 70 I reviewed the Rule of 15 for the treatment of hypoglycemia in detail with the patient. Literature supplied.   2) Diabetic complications:  Eye: Does not have known diabetic retinopathy.  Neuro/ Feet: Does not have known diabetic peripheral neuropathy. Renal: Patient does not have known baseline CKD. She is not on an ACEI/ARB at present.but has microalbuminuria, has a pending referral to nephrology per pt.       F/U in 4 months      Signed electronically by: Mack Guise, MD  [facility] Endocrinology  [facility] [address] [place], [place] [address] Phone: [phone] FAX:  [phone]   CC: Pcp, No No address on file Phone: None  Fax: None  Return to Endocrinology clinic as below: Future Appointments  Date Time Provider [place]  05/21/2022 12:10 PM Una Yeomans, Melanie Crazier, MD [facility] None  07/24/2022 11:10 AM Gemini Bunte, Melanie Crazier, MD [facility] None

## 2022-05-23 ENCOUNTER — Other Ambulatory Visit (HOSPITAL_COMMUNITY): Payer: Self-pay

## 2022-05-25 ENCOUNTER — Other Ambulatory Visit: Payer: Self-pay

## 2022-05-28 ENCOUNTER — Other Ambulatory Visit: Payer: Self-pay

## 2022-05-28 MED ORDER — INSULIN ASPART 100 UNIT/ML IJ SOLN: INTRAMUSCULAR | 3 refills | Status: DC

## 2022-06-14 ENCOUNTER — Encounter (HOSPITAL_COMMUNITY): Payer: Self-pay | Admitting: Vascular Surgery

## 2022-06-14 ENCOUNTER — Telehealth: Payer: Self-pay

## 2022-06-14 NOTE — Progress Notes (Signed)
Anesthesia Chart Review: Ashley Freeman  Case: [id number] Date/Time: 06/15/22 1114   Procedure: RIGHT ARM ARTERIOVENOUS (AV) FISTULA VERSUS ARTERIOVENOUS GRAFT CREATION (Right)   Anesthesia type: Choice   Pre-op diagnosis: ESRD   Location: [facility] OR ROOM 12 / [place] [facility]   Surgeons: Waynetta Sandy, MD       DISCUSSION: Patient is a 32 year old female scheduled for the above procedure.  History includes never smoker, DM1 (diagnosed age 59; with recurrent DKA, last admission 05/30/22 in setting of ), HLD, anemia, ESRD (dialysis initiated ~ 12/2021).  Admission 05/30/02-06/05/02 at [facility] for DKA. Presented with N/V, somnolence. Friend did not recall her having recent cough or other illness. She had been compliant with her insulin pump, but questioned if her insulin pump was malfunctioning. O2 sats were low in the ED at 81% and was placed on 2L/[place]. Initial labs notable for glucose 1221, pH 7.18, AG 27, K 4.8. Last A1C: 8.5 on 02/2022. S/p 2L LR bolus in ED and started on Endotool. Required right femoral CVL in ED due to poor IV access. There was mild interstitial pulmonary edema on CXR but decreased since prior. Head CT showed no acute intracranial abnormality. She did report a sore throat and facial pain with tender cervical chain nodes. Exudates notes on posterior pharyngeal wall.  Respiratory panel positive for Coronaviris HKU1 on 05/31/22. Nephrology managed ESRD. Notes suggest DKA may have been related to coronavirus illness. She was placed back on her insulin pump and has back-up insulin regiment if her pump malfunctions.   She had hospital follow-up with Narda Amber, DO with [facility] in [place] on 06/06/22. Still with sore throat and erythema, but felt due to recent viral illness and PND. Strep throat cultures was negative. Supportive care recommended.   Last cardiology visit with Laurann Montana, NP was on 08/14/21. She was initially seen in 2021 for HTN during pregnancy. She has  has multiple echocardiograms showing normal LVEF with no regional wall motion abnormalities, but she did an echo on 04/18/21 during [facility] admission that showed LVEF estimated at 40% which was attributed to anasarca and volume overload due to CKD. Follow-up with repeat echo recommended. Since then she had a repeat echo on 11/16/21 that showed LVEF 55%. Her CKD also progressed to ESRD and was started on hemodialysis during 12/2021 admission. She did have a PD catheter briefly, but ultimately felt she could not managed PD given her housing arrangements at the time.   Notes indicate that she does HD TTS via [facility]. She needs permanent HD access. Reviewed her recent admission with anesthesiologist Suella Broad, MD. Given recent viral illness with pharyngitis, + coronavirus, DKA with N/V and acute hypoxic respiratory failure, would advise to delay elective surgery for four weeks from diagnosis if she is fully recovered. I notified [facility] RN Herma Ard.    VS:  BP Readings from Last 3 Encounters:  05/09/22 131/89  03/15/22 (!) 140/82  11/23/21 (!) 152/84   Pulse Readings from Last 3 Encounters:  05/09/22 88  03/15/22 80  11/23/21 89     PROVIDERS: Narda Amber, DO is PCP [facility] in [place]) Kelton Pillar, Mammie Lorenzo, MD is endocrinologist Glenetta Hew, MD is cardiologist [place], Holley Raring, MD is nephrologist   LABS: For day of surgery. A1c 8.5% 03/15/22, up to 9.8% 05/31/22.    IMAGES: CXR 05/30/22 [facility]): FINDINGS:  - Right IJ hemodialysis catheter with tips at the cavoatrial junction and right atrium.  - Lungs hypoinflated. Mild interstitial pulmonary edema,  decreased since prior. Small right pleural effusion. No pneumothorax.  - Cardiomegaly.  IMPRESSION:  Mild interstitial pulmonary edema, decreased since prior.   CT Head 05/30/22 [facility]): IMPRESSION:  No acute intracranial abnormality.    EKG: EKG 05/31/22 [facility]): Per Narrative in Care Everywhere: SINUS TACHYCARDIA  NONSPECIFIC T WAVE  ABNORMALITY  ABNORMAL ECG  Confirmed by Kandis Cocking [phone]) on 06/13/2022 7:31:49 PM    CV: Echo 11/16/21 [facility]): Summary   1. The left ventricle is normal in size with mildly increased wall  thickness.   2. The left ventricular systolic function is overall normal, LVEF is  visually estimated at 55%.    3. The right ventricle is normal in size, with normal systolic function.    Past Medical History:  Diagnosis Date   Abscess, gluteal, right 08/24/2013   AKI (acute kidney injury) ([place]) 07/26/2014   Anemia 02/19/2012   Bartholin's gland abscess 09/19/2013   BV (bacterial vaginosis) 11/24/2015   Diabetes mellitus type I ([place]) 2001   Diagnosed at age 50 ; Type I   Diarrhea 05/30/2016   DKA (diabetic ketoacidoses) 08/19/2013   Also in 2018   Gonorrhea 08/2011   Treated in 09/2011   History of trichomoniasis 05/31/2016   Hyperlipidemia 03/28/2016   Sepsis ([place]) 09/19/2013    Past Surgical History:  Procedure Laterality Date   CESAREAN SECTION N/A 10/05/2019   Procedure: CESAREAN SECTION;  Surgeon: Aletha Halim, MD;  Location: [facility] LD [facility]S;  Service: Obstetrics;  Laterality: N/A;   INCISION AND DRAINAGE ABSCESS Left 09/28/2019   Procedure: INCISION AND DRAINAGE VULVAR ABCESS;  Surgeon: [name], MD;  Location: [place];  Service: Gynecology;  Laterality: Left;   INCISION AND DRAINAGE PERIRECTAL ABSCESS Right 08/18/2013   Procedure: IRRIGATION AND DEBRIDEMENT GLUTEAL ABSCESS;  Surgeon: Ralene Ok, MD;  Location: [place];  Service: General;  Laterality: Right;   INCISION AND DRAINAGE PERIRECTAL ABSCESS Right 09/19/2013   Procedure: IRRIGATION AND DEBRIDEMENT RIGHT GLUTEAL AND LABIAL ABSCESSES;  Surgeon: Ralene Ok, MD;  Location: [place];  Service: General;  Laterality: Right;   INCISION AND DRAINAGE PERIRECTAL ABSCESS Right 09/24/2013   Procedure: IRRIGATION AND DEBRIDEMENT PERIRECTAL ABSCESS;  Surgeon: Gwenyth Ober, MD;  Location: [place];  Service: General;  Laterality: Right;     MEDICATIONS: No current facility-administered medications for this encounter.    acetaminophen (TYLENOL) 500 MG tablet   amLODipine (NORVASC) 10 MG tablet   blood glucose meter kit and supplies KIT   Blood Pressure Monitoring (BLOOD PRESSURE KIT) DEVI   carvedilol (COREG) 25 MG tablet   Continuous Blood Gluc Sensor (DEXCOM G6 SENSOR) MISC   Continuous Blood Gluc Transmit (DEXCOM G6 TRANSMITTER) MISC   gentamicin cream (GARAMYCIN) 0.1 %   insulin aspart (NOVOLOG) 100 UNIT/ML injection   Insulin Disposable Pump (OMNIPOD 5 G6 INTRO, GEN 5,) KIT   Insulin Disposable Pump (OMNIPOD 5 G6 POD, GEN 5,) MISC   Insulin Pen Needle 32G X 4 MM MISC   lurasidone (LATUDA) 40 MG TABS tablet   Prenatal Vit-Fe Fumarate-FA (PRENATAL MULTIVITAMIN) TABS tablet    Myra Gianotti, PA-C Surgical Short Stay/Anesthesiology [facility] Phone [phone] [facility] Phone [phone] 06/14/2022 5:28 PM

## 2022-06-14 NOTE — Progress Notes (Signed)
Made Geanie Berlin, RN (flow coordinator) aware of cancellation and office unable to reach patient to make her aware.

## 2022-06-14 NOTE — Telephone Encounter (Signed)
Received a message from Myra Gianotti, PA-C with preadmission anesthesia who advised, "patient was in the hospital for DKA and N/V, low sats, sore throat +common coronovirus 05/30/22-06/05/22. Anesthesia is recommending surgery be postponed for 4 weeks from diagnosis as long as she is feeling ok."  Attempted to reach patient at multiple numbers in chart and per documented DPRs on file. All other numbers were unable to be completed or wrong number except home number- left vm and also provided [facility] short-stay number due to [facility] office now closed.   Spoke with Laveda Abbe at [facility] short stay and explained the situation just in case patient either calls or shows up on tomorrow. She verbalized understanding and will pass information along to staff.

## 2022-06-15 ENCOUNTER — Ambulatory Visit (HOSPITAL_COMMUNITY): Admission: RE | Admit: 2022-06-15 | Payer: Medicaid Other | Source: Home / Self Care | Admitting: Vascular Surgery

## 2022-06-15 SURGERY — ARTERIOVENOUS (AV) FISTULA CREATION
Anesthesia: Choice | Laterality: Right

## 2022-06-15 NOTE — Telephone Encounter (Signed)
Patient contacted office and reason for surgery cancellation was explained. Patient rescheduled for 07/06/22. Instructions reviewed and patient verbalized understanding. Patient also informed that her phone is currently broken, but has plans to get a new phone and number should remain the same. In the interim, she provided an updated contact number for her mother Stuart.

## 2022-06-22 ENCOUNTER — Telehealth: Payer: Self-pay

## 2022-06-22 ENCOUNTER — Encounter: Payer: Self-pay | Admitting: Internal Medicine

## 2022-06-22 ENCOUNTER — Ambulatory Visit (INDEPENDENT_AMBULATORY_CARE_PROVIDER_SITE_OTHER): Payer: Medicaid Other | Admitting: Internal Medicine

## 2022-06-22 VITALS — BP 136/88 | HR 100 | Ht 63.0 in | Wt 131.0 lb

## 2022-06-22 DIAGNOSIS — Z992 Dependence on renal dialysis: Secondary | ICD-10-CM

## 2022-06-22 DIAGNOSIS — N186 End stage renal disease: Secondary | ICD-10-CM | POA: Diagnosis not present

## 2022-06-22 DIAGNOSIS — E10319 Type 1 diabetes mellitus with unspecified diabetic retinopathy without macular edema: Secondary | ICD-10-CM | POA: Diagnosis not present

## 2022-06-22 DIAGNOSIS — E1022 Type 1 diabetes mellitus with diabetic chronic kidney disease: Secondary | ICD-10-CM

## 2022-06-22 DIAGNOSIS — E1065 Type 1 diabetes mellitus with hyperglycemia: Secondary | ICD-10-CM

## 2022-06-22 MED ORDER — INSULIN ASPART 100 UNIT/ML IJ SOLN: INTRAMUSCULAR | 3 refills | Status: DC

## 2022-06-22 MED ORDER — DEXCOM G6 TRANSMITTER MISC: 1.0000 | 3 refills | Status: DC

## 2022-06-22 MED ORDER — DEXCOM G6 SENSOR MISC: 3 refills | Status: DC

## 2022-06-22 NOTE — Progress Notes (Signed)
Name: Ashley Freeman  Age/ Sex: 32 y.o., female   MRN/ DOB: [id number], Mar 01, 1991     PCP: Pcp, No   Reason for Endocrinology Evaluation: Type 2 Diabetes Mellitus  Initial Endocrine Consultative Visit: 06/27/18    PATIENT IDENTIFIER: Ashley Freeman is a 32 y.o. 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 The patient has followed with Endocrinology clinic since 06/27/2018 for consultative assistance with management of her diabetes.  DIABETIC HISTORY:  [name] was diagnosed with T1DM at age 66. Hx of multiple DKA's due to non-compliance. Her hemoglobin A1c has ranged from  in 11.3% in 2011, peaking at 19.3% in 2015.  S/P C-section 10/2019 ( [name])  SUBJECTIVE:   During the last visit (03/15/2022): A1c 8.5 %    Today (06/22/2022): [name] is here for a follow up on diabetes management .  She checks glucose multiple times a day through CGM, she has noted hyperglycemia while on Peritoneal dialysis    She had an admission for DKA in 04/2022 She is S/P laparoscopic peritoneal dialysis catheter placement 01/2022 she was on PD but now on hemodialysis  Pending right arm AVF 07/06/2022 She had a class IV kidney transplant orientation through [facility] on 02/26/2022, Brother will donate kidney    Denies nausea, or vomiting  Has headaches and hypotension during  dialysis  Minimal diarrhea  Has cataract sx  Pending laser eye treatment    HOME DIABETES REGIMEN:   Novolog    Pump   Omnipod Settings   Insulin type   NOVOLOG     Basal rate          0000 0.5                     I:C ratio          0000 12                       AIT  4 hrs            Sensitivity          0000  50           Goal          0000  130             PUMP STATISTICS: Average BG: 257  Average Daily Carbs (g): 75.4 Average Total Daily Insulin: 20.7   Average Daily Basal: 8.8 (43 %) Average Daily Bolus: 11.9 (57 %)     CONTINUOUS GLUCOSE MONITORING RECORD INTERPRETATION    Dates of Recording: 2/10-2/23/2024  Sensor description: dexcom  Results statistics:   CGM use % of time 86  Average and SD 257/118  Time in range   32%  % Time Above 180 14  % Time above 250 50  % Time Below target 2   Glycemic patterns summary: Hyperglycemia is noted during the day and the night Hyperglycemic episodes all day and night  Hypoglycemic episodes occurred after a manual bolus  Overnight periods: Variable      DIABETIC COMPLICATIONS: Microvascular complications:  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 Denies: neuropathy, retinopathy, CKD Last eye exam: Completed 06/18/2022   Macrovascular complications:    Denies: CAD, PVD, CVA  HISTORY:  Past Medical History:  Past Medical History:  Diagnosis Date   Abscess, gluteal, right 08/24/2013   AKI (acute kidney  injury) ([place]) 07/26/2014   Anemia 02/19/2012   Bartholin's gland abscess 09/19/2013   BV (bacterial vaginosis) 11/24/2015   Diabetes mellitus type I ([place]) 2001   Diagnosed at age 57 ; Type I   Diarrhea 05/30/2016   DKA (diabetic ketoacidoses) 08/19/2013   Also in 2018   Gonorrhea 08/2011   Treated in 09/2011   History of trichomoniasis 05/31/2016   Hyperlipidemia 03/28/2016   Sepsis ([place]) 09/19/2013   Past Surgical History:  Past Surgical History:  Procedure Laterality Date   CESAREAN SECTION N/A 10/05/2019   Procedure: CESAREAN SECTION;  Surgeon: Aletha Halim, MD;  [facility];  Service: Obstetrics;  Laterality: N/A;   INCISION AND DRAINAGE ABSCESS Left 09/28/2019   Procedure: INCISION AND DRAINAGE VULVAR ABCESS;  Surgeon: [name], MD;  Location: [place];  Service: Gynecology;  Laterality: Left;   INCISION AND DRAINAGE PERIRECTAL ABSCESS Right 08/18/2013   Procedure: IRRIGATION AND DEBRIDEMENT GLUTEAL ABSCESS;  Surgeon: Ralene Ok, MD;  Location: [place];  Service: General;   Laterality: Right;   INCISION AND DRAINAGE PERIRECTAL ABSCESS Right 09/19/2013   Procedure: IRRIGATION AND DEBRIDEMENT RIGHT GLUTEAL AND LABIAL ABSCESSES;  Surgeon: Ralene Ok, MD;  Location: [place];  Service: General;  Laterality: Right;   INCISION AND DRAINAGE PERIRECTAL ABSCESS Right 09/24/2013   Procedure: IRRIGATION AND DEBRIDEMENT PERIRECTAL ABSCESS;  Surgeon: Gwenyth Ober, MD;  Location: [place];  Service: General;  Laterality: Right;   Social History:  reports that she has never smoked. She has never used smokeless tobacco. She reports that she does not currently use alcohol. She reports that she does not use drugs. Family History:  Family History  Problem Relation Age of Onset   Asthma Mother    Carpal tunnel syndrome Mother    Gout Father    Diabetes Paternal Grandmother    Anesthesia problems Neg Hx      HOME MEDICATIONS: Allergies as of 06/22/2022       Reactions   Cephalexin Anaphylaxis   Has gotten ceftriaxone in the past    Penicillins Hives, Rash   Has patient had a PCN reaction causing immediate rash, facial/tongue/throat swelling, SOB or lightheadedness with hypotension: Yes Has patient had a PCN reaction causing severe rash involving mucus membranes or skin necrosis: No Has patient had a PCN reaction that required hospitalization: Yes Has patient had a PCN reaction occurring within the last 10 years: No Spoke with pt - childhood hives told by mom, tried no pcns since, doesn't remember reaction herself    Benadryl [diphenhydramine] Itching   Doxycycline Itching        Medication List        Accurate as of June 22, 2022  3:26 PM. If you have any questions, ask your nurse or doctor.          STOP taking these medications    furosemide 80 MG tablet Commonly known as: LASIX Stopped by: Dorita Sciara, MD   prenatal multivitamin Tabs tablet Stopped by: Dorita Sciara, MD       TAKE these medications    acetaminophen 500 MG  tablet Commonly known as: TYLENOL Take 1,000 mg by mouth every 6 (six) hours as needed for headache (pain).   amLODipine 10 MG tablet Commonly known as: NORVASC Take 1 tablet (10 mg total) by mouth daily.   blood glucose meter kit and supplies Kit Dispense based on patient and insurance preference. Use up to four times daily as directed. (FOR ICD-9  250.00, 250.01).   Blood Pressure Kit Devi 1 Device by Does not apply route as needed.   carvedilol 25 MG tablet Commonly known as: COREG Take 1 tablet (25 mg total) by mouth 2 (two) times daily.   Dexcom G6 Sensor Misc Change sensors every 10 days   Dexcom G6 Transmitter Misc Inject 1 Device into the skin as directed. Use to check blood sugar daily   famotidine 20 MG tablet Commonly known as: PEPCID Take 1 tablet by mouth daily.   ferrous sulfate 324 (65 Fe) MG Tbec Take 1 tablet by mouth daily.   gentamicin cream 0.1 % Commonly known as: GARAMYCIN SMARTSIG:1 Topical Daily   insulin aspart 100 UNIT/ML injection Commonly known as: novoLOG Max daily 50 units .Uses insulin pump   lamoTRIgine 25 MG tablet Commonly known as: LAMICTAL Take 25 mg by mouth daily.   lurasidone 40 MG Tabs tablet Commonly known as: LATUDA Take 40 mg by mouth daily with breakfast.   Omnipod 5 G6 Intro (Gen 5) Kit Change pods every 3 days   Omnipod 5 G6 Pods (Gen 5) Misc CHANGE EVERY 3 DAYS   Pentips 32G X 4 MM Misc Generic drug: Insulin Pen Needle Use as directed   rosuvastatin 40 MG tablet Commonly known as: CRESTOR Take 1 tablet by mouth daily.   sevelamer carbonate 800 MG tablet Commonly known as: RENVELA Take 800 mg by mouth 3 (three) times daily with meals.   SUMAtriptan 50 MG tablet Commonly known as: IMITREX Take 50 mg by mouth every 2 (two) hours as needed.   torsemide 20 MG tablet Commonly known as: DEMADEX Take 20 mg by mouth daily.         OBJECTIVE:   PHYSICAL EXAM: VS:BP 136/88 (BP Location: Left Arm,  Patient Position: Sitting, Cuff Size: Small)   Pulse 100   Ht '5\' 3"'$  (1.6 m)   Wt 131 lb (59.4 kg)   SpO2 97%   BMI 23.21 kg/m    EXAM: General: Pt appears well and is in NAD  Lungs: Clear with good BS bilat   Heart: RRR  Abdomen:   soft and non-tender  Extremities: No pretibial edema   Mental Status: Judgment, insight: intact Mood and affect: no depression, anxiety, or agitation     DM foot exam: 06/22/2022 The skin of the feet is intact without sores or ulcerations. The pedal pulses are 2+ on right and 2+ on left. The sensation is intact to a screening 5.07, 10 gram monofilament bilaterally     DATA REVIEWED:  Lab Results  Component Value Date   HGBA1C 8.5 (A) 03/15/2022   HGBA1C 9.1 (H) 11/09/2021   HGBA1C 8.0 (H) 07/26/2021   06/05/2022 Na 137 K 3.7 BUN 31 Creatinine 4.41 GFR 13   05/07/2022 4.979 u IU/mL  ASSESSMENT / PLAN / RECOMMENDATIONS:   1) Type 1 Diabetes Mellitus, Poorly controlled, With retinopathic and CKD IV complications- Most recent A1c of 10.0 %. Goal A1c < 7.0 %.    -Per patient, she recently had an A1c of 10.0% (actual record not available) -In reviewing her Dexcom and pump download, she continues to have intermittent CGM use, she is currently on the Dexcom G7 because she was unable to obtain the G6 due to insurance/pharmacy issues, unfortunately when she does not use the Dexcom G6 the pump is on manual mode which does not improve hyperglycemia. -She has been noted with manual bolus, resulting in hypoglycemia, I have encouraged the patient to enter carbohydrate and  glucose data only and let the pump make the decision regarding insulin dose -She is interested in switching to the tandem pump, referral will be placed to our CDE -I will increase her basal rate during the day, adjust her insulin to carb ratio as well as sensitivity factor as below  MEDICATIONS:  Novolog     Pump   Omnipod Settings   Insulin type   NOVOLOG     Basal rate           0000 0.5    1100 0.6                I:C ratio          0000 10                       AIT  4 hrs            Sensitivity          0000  45           Goal          0000  120                     EDUCATION / INSTRUCTIONS: BG monitoring instructions: Patient is instructed to check her blood sugars 4 times a day, before meals and bedtime  Call [facility] if: BG persistently < 70 I reviewed the Rule of 15 for the treatment of hypoglycemia in detail with the patient. Literature supplied.   2) Diabetic complications:  Eye: Does not have known diabetic retinopathy.  Neuro/ Feet: Does not have known diabetic peripheral neuropathy. Renal: Patient does have known baseline CKD.  She is on hemodialysis   F/U in 3 months      Signed electronically by: Mack Guise, MD  [facility] Endocrinology  [facility] [address] [place], [place] [address] Phone: [phone] FAX: [phone]   CC: Pcp, No No address on file Phone: None  Fax: None  Return to Endocrinology clinic as below: Future Appointments  Date Time Provider [place]  12/14/2022  9:50 AM Kunaal Walkins, Melanie Crazier, MD [facility] None

## 2022-06-22 NOTE — Telephone Encounter (Signed)
duplicate

## 2022-06-22 NOTE — Patient Instructions (Signed)
HOW TO TREAT LOW BLOOD SUGARS (Blood sugar LESS THAN 70 MG/DL) Please follow the RULE OF 15 for the treatment of hypoglycemia treatment (when your (blood sugars are less than 70 mg/dL)   STEP 1: Take 15 grams of carbohydrates when your blood sugar is low, which includes:  3-4 GLUCOSE TABS  OR 3-4 OZ OF JUICE OR REGULAR SODA OR ONE TUBE OF GLUCOSE GEL    STEP 2: RECHECK blood sugar in 15 MINUTES STEP 3: If your blood sugar is still low at the 15 minute recheck --> then, go back to STEP 1 and treat AGAIN with another 15 grams of carbohydrates.  

## 2022-06-25 ENCOUNTER — Encounter: Payer: Self-pay | Admitting: Internal Medicine

## 2022-06-26 ENCOUNTER — Telehealth: Payer: Self-pay

## 2022-06-26 DIAGNOSIS — E1065 Type 1 diabetes mellitus with hyperglycemia: Secondary | ICD-10-CM

## 2022-06-26 NOTE — Telephone Encounter (Signed)
Patient needs C-peptide and and fasting glucose for pump orders.

## 2022-06-27 ENCOUNTER — Other Ambulatory Visit: Payer: Self-pay

## 2022-06-27 ENCOUNTER — Telehealth: Payer: Self-pay

## 2022-06-27 DIAGNOSIS — N186 End stage renal disease: Secondary | ICD-10-CM

## 2022-06-27 NOTE — Telephone Encounter (Signed)
Call placed to patient. Spoke with mother, Mikki Santee regarding change to arrival time for surgery on 07/06/22 to 0530 AM and now with Dr. Carlis Abbott due to provider schedule change. She verbalized understanding and stated she will pass information along to patient.

## 2022-06-29 ENCOUNTER — Telehealth: Payer: Self-pay

## 2022-06-29 NOTE — Telephone Encounter (Signed)
Spoke to pt to give her updated arrival time for her surgery next Friday 07/06/22-pt verbalized understanding. No questions/concerns at this time.

## 2022-07-02 ENCOUNTER — Other Ambulatory Visit: Payer: Medicaid Other

## 2022-07-02 ENCOUNTER — Other Ambulatory Visit: Payer: Self-pay

## 2022-07-02 MED ORDER — DEXCOM G6 TRANSMITTER MISC: 1.0000 | 3 refills | Status: DC

## 2022-07-02 MED ORDER — DEXCOM G6 SENSOR MISC: 3 refills | Status: DC

## 2022-07-04 ENCOUNTER — Other Ambulatory Visit: Payer: Medicaid Other

## 2022-07-05 ENCOUNTER — Encounter (HOSPITAL_COMMUNITY): Payer: Self-pay | Admitting: Vascular Surgery

## 2022-07-05 NOTE — Progress Notes (Addendum)
Ashley Freeman denies chest pain or shortness of breath. Patient denies any s/s of Covid.  Ashley Freeman denies any s/s of Upper Respiratory infection, in the past month and denies any s/s of lower respiratory infection in the past 2 months.  Ashley Freeman does not have PCP, patient's cardiologist is Dr. Ellyn Hack, Endocrinologist is Dr. Kelton Pillar. Ashley Freeman has type I diabetes, patient has an insulin pump, Omipod. I asked if she has instructions from Dr. Kelton Pillar on what to do if she is NPO, she answered no.  I told Ashley Freeman that we would like for her to get in touch with Dr. Kelton Pillar for her instructions on what to do when she is NPO, Ashley Freeman said that she will.  I instructed  patient to bring an additional pump and Insulin with her, in case current one is removed. I instructed patient to check CBG after awaking and every 2 hours until arrival  to the hospital.  I Instructed patient if CBG is less than 70 to take 4 Glucose Tablets or 1 tube of Glucose Gel or 1/2 cup of a clear juice. Recheck CBG in 15 minutes if CBG is not over 70 call, pre- op desk at [phone] for further instructions.   I notified Ashley Freeman,  diabetic coordinator of patient and her procedure for tomorrow

## 2022-07-06 ENCOUNTER — Other Ambulatory Visit: Payer: Self-pay

## 2022-07-06 ENCOUNTER — Ambulatory Visit (HOSPITAL_BASED_OUTPATIENT_CLINIC_OR_DEPARTMENT_OTHER): Payer: Medicaid Other | Admitting: Anesthesiology

## 2022-07-06 ENCOUNTER — Encounter (HOSPITAL_COMMUNITY): Payer: Self-pay | Admitting: Vascular Surgery

## 2022-07-06 ENCOUNTER — Encounter (HOSPITAL_COMMUNITY)
Admission: RE | Disposition: A | Discharge status: Home / Self Care | Payer: Self-pay | Source: Home / Self Care | Attending: Vascular Surgery

## 2022-07-06 ENCOUNTER — Ambulatory Visit (HOSPITAL_COMMUNITY)
Admission: RE | Admit: 2022-07-06 | Discharge: 2022-07-06 | Disposition: A | Discharge status: Home / Self Care | Payer: Medicaid Other | Attending: Vascular Surgery | Admitting: Vascular Surgery

## 2022-07-06 ENCOUNTER — Other Ambulatory Visit: Payer: Self-pay | Admitting: Physician Assistant

## 2022-07-06 ENCOUNTER — Ambulatory Visit (HOSPITAL_COMMUNITY): Payer: Medicaid Other | Admitting: Anesthesiology

## 2022-07-06 DIAGNOSIS — E1022 Type 1 diabetes mellitus with diabetic chronic kidney disease: Secondary | ICD-10-CM | POA: Diagnosis not present

## 2022-07-06 DIAGNOSIS — E1122 Type 2 diabetes mellitus with diabetic chronic kidney disease: Secondary | ICD-10-CM | POA: Insufficient documentation

## 2022-07-06 DIAGNOSIS — I120 Hypertensive chronic kidney disease with stage 5 chronic kidney disease or end stage renal disease: Secondary | ICD-10-CM | POA: Insufficient documentation

## 2022-07-06 DIAGNOSIS — Z79899 Other long term (current) drug therapy: Secondary | ICD-10-CM | POA: Diagnosis not present

## 2022-07-06 DIAGNOSIS — N185 Chronic kidney disease, stage 5: Secondary | ICD-10-CM | POA: Diagnosis not present

## 2022-07-06 DIAGNOSIS — Z794 Long term (current) use of insulin: Secondary | ICD-10-CM | POA: Insufficient documentation

## 2022-07-06 DIAGNOSIS — N186 End stage renal disease: Secondary | ICD-10-CM | POA: Insufficient documentation

## 2022-07-06 DIAGNOSIS — Z992 Dependence on renal dialysis: Secondary | ICD-10-CM | POA: Diagnosis not present

## 2022-07-06 DIAGNOSIS — F319 Bipolar disorder, unspecified: Secondary | ICD-10-CM | POA: Diagnosis not present

## 2022-07-06 HISTORY — DX: Essential (primary) hypertension: I10

## 2022-07-06 HISTORY — DX: Bipolar disorder, unspecified: F31.9

## 2022-07-06 HISTORY — DX: Depression, unspecified: F32.A

## 2022-07-06 HISTORY — PX: AV FISTULA PLACEMENT: SHX1204

## 2022-07-06 LAB — POCT I-STAT, CHEM 8
BUN: 31 mg/dL — ABNORMAL HIGH (ref 6–20)
Calcium, Ion: 1.12 mmol/L — ABNORMAL LOW (ref 1.15–1.40)
Chloride: 103 mmol/L (ref 98–111)
Creatinine, Ser: 5.3 mg/dL — ABNORMAL HIGH (ref 0.44–1.00)
Glucose, Bld: 52 mg/dL — ABNORMAL LOW (ref 70–99)
HCT: 35 % — ABNORMAL LOW (ref 36.0–46.0)
Hemoglobin: 11.9 g/dL — ABNORMAL LOW (ref 12.0–15.0)
Potassium: 3.2 mmol/L — ABNORMAL LOW (ref 3.5–5.1)
Sodium: 142 mmol/L (ref 135–145)
TCO2: 28 mmol/L (ref 22–32)

## 2022-07-06 LAB — GLUCOSE, CAPILLARY
Glucose-Capillary: 56 mg/dL — ABNORMAL LOW (ref 70–99)
Glucose-Capillary: 117 mg/dL — ABNORMAL HIGH (ref 70–99)
Glucose-Capillary: 172 mg/dL — ABNORMAL HIGH (ref 70–99)

## 2022-07-06 SURGERY — ARTERIOVENOUS (AV) FISTULA CREATION
Anesthesia: Monitor Anesthesia Care | Site: Arm Upper | Laterality: Right

## 2022-07-06 MED ORDER — SODIUM CHLORIDE 0.9 % IV SOLN: INTRAVENOUS | Status: DC

## 2022-07-06 MED ORDER — INSULIN ASPART 100 UNIT/ML IJ SOLN: 0.0000 [IU] | INTRAMUSCULAR | Status: DC | PRN

## 2022-07-06 MED ORDER — CHLORHEXIDINE GLUCONATE 0.12 % MT SOLN: 15.0000 mL | Freq: Once | OROMUCOSAL | Status: AC

## 2022-07-06 MED ORDER — PAPAVERINE HCL 30 MG/ML IJ SOLN
INTRAMUSCULAR | Status: AC
  Filled 2022-07-06: qty 2

## 2022-07-06 MED ORDER — ORAL CARE MOUTH RINSE
15.0000 mL | Freq: Once | OROMUCOSAL | Status: AC
  Administered 2022-07-06: 15 mL via OROMUCOSAL

## 2022-07-06 MED ORDER — HEPARIN 6000 UNIT IRRIGATION SOLUTION
Status: DC | PRN
  Administered 2022-07-06: 1

## 2022-07-06 MED ORDER — MIDAZOLAM HCL 2 MG/2ML IJ SOLN
INTRAMUSCULAR | Status: AC
  Filled 2022-07-06: qty 2

## 2022-07-06 MED ORDER — LIDOCAINE HCL 1 % IJ SOLN
INTRAMUSCULAR | Status: AC
  Filled 2022-07-06: qty 20

## 2022-07-06 MED ORDER — OXYCODONE HCL 5 MG/5ML PO SOLN: 5.0000 mg | Freq: Once | ORAL | Status: AC | PRN

## 2022-07-06 MED ORDER — LIDOCAINE-EPINEPHRINE (PF) 1.5 %-1:200000 IJ SOLN
INTRAMUSCULAR | Status: DC | PRN
  Administered 2022-07-06: 20 mL via PERINEURAL

## 2022-07-06 MED ORDER — PROMETHAZINE HCL 25 MG/ML IJ SOLN: 6.2500 mg | INTRAMUSCULAR | Status: DC | PRN

## 2022-07-06 MED ORDER — OXYCODONE HCL 5 MG PO CAPS: 5.0000 mg | ORAL_CAPSULE | Freq: Four times a day (QID) | ORAL | 0 refills | Status: DC | PRN

## 2022-07-06 MED ORDER — ACETAMINOPHEN 500 MG PO TABS
1000.0000 mg | ORAL_TABLET | Freq: Once | ORAL | Status: AC
  Administered 2022-07-06: 1000 mg via ORAL

## 2022-07-06 MED ORDER — OXYCODONE HCL 5 MG PO TABS
ORAL_TABLET | ORAL | Status: AC
  Filled 2022-07-06: qty 1

## 2022-07-06 MED ORDER — ACETAMINOPHEN 500 MG PO TABS
ORAL_TABLET | ORAL | Status: AC
  Filled 2022-07-06: qty 2

## 2022-07-06 MED ORDER — CHLORHEXIDINE GLUCONATE 0.12 % MT SOLN
OROMUCOSAL | Status: AC
  Filled 2022-07-06: qty 15

## 2022-07-06 MED ORDER — OXYCODONE HCL 5 MG PO TABS
5.0000 mg | ORAL_TABLET | Freq: Once | ORAL | Status: AC | PRN
  Administered 2022-07-06: 5 mg via ORAL

## 2022-07-06 MED ORDER — MIDAZOLAM HCL 2 MG/2ML IJ SOLN
INTRAMUSCULAR | Status: AC
  Administered 2022-07-06: 2 mg
  Filled 2022-07-06: qty 2

## 2022-07-06 MED ORDER — FENTANYL CITRATE (PF) 100 MCG/2ML IJ SOLN
INTRAMUSCULAR | Status: AC
  Administered 2022-07-06: 100 ug
  Filled 2022-07-06: qty 2

## 2022-07-06 MED ORDER — HEPARIN 6000 UNIT IRRIGATION SOLUTION
Status: AC
  Filled 2022-07-06: qty 500

## 2022-07-06 MED ORDER — VANCOMYCIN HCL IN DEXTROSE 1-5 GM/200ML-% IV SOLN
1000.0000 mg | INTRAVENOUS | Status: AC
  Administered 2022-07-06: 1000 mg via INTRAVENOUS
  Filled 2022-07-06: qty 200

## 2022-07-06 MED ORDER — DEXTROSE 50 % IV SOLN
INTRAVENOUS | Status: AC
  Filled 2022-07-06: qty 50

## 2022-07-06 MED ORDER — HEPARIN SODIUM (PORCINE) 1000 UNIT/ML IJ SOLN
INTRAMUSCULAR | Status: DC | PRN
  Administered 2022-07-06: 3000 [IU] via INTRAVENOUS

## 2022-07-06 MED ORDER — CHLORHEXIDINE GLUCONATE 4 % EX LIQD: 60.0000 mL | Freq: Once | CUTANEOUS | Status: DC

## 2022-07-06 MED ORDER — LIDOCAINE HCL 1 % IJ SOLN: INTRAMUSCULAR | Status: DC | PRN

## 2022-07-06 MED ORDER — DEXTROSE 50 % IV SOLN: 12.5000 g | INTRAVENOUS | Status: DC

## 2022-07-06 MED ORDER — PROPOFOL 500 MG/50ML IV EMUL
INTRAVENOUS | Status: DC | PRN
  Administered 2022-07-06: 75 ug/kg/min via INTRAVENOUS

## 2022-07-06 MED ORDER — FENTANYL CITRATE (PF) 250 MCG/5ML IJ SOLN
INTRAMUSCULAR | Status: AC
  Filled 2022-07-06: qty 5

## 2022-07-06 MED ORDER — FENTANYL CITRATE (PF) 100 MCG/2ML IJ SOLN: 25.0000 ug | INTRAMUSCULAR | Status: DC | PRN

## 2022-07-06 MED ORDER — 0.9 % SODIUM CHLORIDE (POUR BTL) OPTIME
TOPICAL | Status: DC | PRN
  Administered 2022-07-06: 1000 mL

## 2022-07-06 SURGICAL SUPPLY — 37 items
ADH SKN CLS APL DERMABOND .7 (GAUZE/BANDAGES/DRESSINGS) ×1
AGENT HMST SPONGE THK3/8 (HEMOSTASIS)
ARMBAND PINK RESTRICT EXTREMIT (MISCELLANEOUS) ×2 IMPLANT
BAG COUNTER SPONGE SURGICOUNT (BAG) ×1 IMPLANT
BAG SPNG CNTER NS LX DISP (BAG)
BLADE CLIPPER SURG (BLADE) ×1 IMPLANT
CANISTER SUCT 3000ML PPV (MISCELLANEOUS) ×1 IMPLANT
CLIP TI MEDIUM 6 (CLIP) ×1 IMPLANT
CLIP TI WIDE RED SMALL 6 (CLIP) ×1 IMPLANT
COVER PROBE W GEL 5X96 (DRAPES) ×1 IMPLANT
DERMABOND ADVANCED .7 DNX12 (GAUZE/BANDAGES/DRESSINGS) ×1 IMPLANT
ELECT REM PT RETURN 9FT ADLT (ELECTROSURGICAL) ×1
ELECTRODE REM PT RTRN 9FT ADLT (ELECTROSURGICAL) ×1 IMPLANT
GLOVE BIO SURGEON STRL SZ7.5 (GLOVE) ×1 IMPLANT
GLOVE BIOGEL PI IND STRL 8 (GLOVE) ×1 IMPLANT
GOWN STRL REUS W/ TWL LRG LVL3 (GOWN DISPOSABLE) ×2 IMPLANT
GOWN STRL REUS W/ TWL XL LVL3 (GOWN DISPOSABLE) ×2 IMPLANT
GOWN STRL REUS W/TWL LRG LVL3 (GOWN DISPOSABLE) ×2
GOWN STRL REUS W/TWL XL LVL3 (GOWN DISPOSABLE) ×2
GRAFT GORETEX STRT 4-7X45 (Vascular Products) IMPLANT
HEMOSTAT SPONGE AVITENE ULTRA (HEMOSTASIS) IMPLANT
KIT BASIN OR (CUSTOM PROCEDURE TRAY) ×1 IMPLANT
KIT TURNOVER KIT B (KITS) ×1 IMPLANT
NS IRRIG 1000ML POUR BTL (IV SOLUTION) ×1 IMPLANT
PACK CV ACCESS (CUSTOM PROCEDURE TRAY) ×1 IMPLANT
PAD ARMBOARD 7.5X6 YLW CONV (MISCELLANEOUS) ×2 IMPLANT
SLING ARM FOAM STRAP LRG (SOFTGOODS) IMPLANT
SLING ARM FOAM STRAP MED (SOFTGOODS) IMPLANT
SPIKE FLUID TRANSFER (MISCELLANEOUS) ×1 IMPLANT
SUT MNCRL AB 4-0 PS2 18 (SUTURE) ×1 IMPLANT
SUT PROLENE 6 0 BV (SUTURE) ×1 IMPLANT
SUT PROLENE 7 0 BV 1 (SUTURE) IMPLANT
SUT VIC AB 3-0 SH 27 (SUTURE) ×2
SUT VIC AB 3-0 SH 27X BRD (SUTURE) ×1 IMPLANT
TOWEL GREEN STERILE (TOWEL DISPOSABLE) ×1 IMPLANT
UNDERPAD 30X36 HEAVY ABSORB (UNDERPADS AND DIAPERS) ×1 IMPLANT
WATER STERILE IRR 1000ML POUR (IV SOLUTION) ×1 IMPLANT

## 2022-07-06 NOTE — Anesthesia Preprocedure Evaluation (Addendum)
Anesthesia Evaluation  Patient identified by MRN, date of birth, ID band Patient awake    Reviewed: Allergy & Precautions, NPO status , Patient's Chart, lab work & pertinent test results, reviewed documented beta blocker date and time   History of Anesthesia Complications Negative for: history of anesthetic complications  Airway Mallampati: II  TM Distance: >3 FB Neck ROM: Full    Dental  (+) Dental Advisory Given, Teeth Intact   Pulmonary neg pulmonary ROS   Pulmonary exam normal        Cardiovascular hypertension, Pt. on medications and Pt. on home beta blockers Normal cardiovascular exam     Neuro/Psych  PSYCHIATRIC DISORDERS  Depression Bipolar Disorder   negative neurological ROS     GI/Hepatic negative GI ROS, Neg liver ROS,,,  Endo/Other  diabetes, Type 1, Insulin Dependent    Renal/GU ESRF and DialysisRenal disease     Musculoskeletal negative musculoskeletal ROS (+)    Abdominal   Peds  Hematology negative hematology ROS (+)   Anesthesia Other Findings   Reproductive/Obstetrics                             Anesthesia Physical Anesthesia Plan  ASA: 3  Anesthesia Plan: Regional   Post-op Pain Management: Regional block* and Tylenol PO (pre-op)*   Induction:   PONV Risk Score and Plan: 2 and Propofol infusion and Treatment may vary due to age or medical condition  Airway Management Planned: Natural Airway and Simple Face Mask  Additional Equipment: None  Intra-op Plan:   Post-operative Plan:   Informed Consent: I have reviewed the patients History and Physical, chart, labs and discussed the procedure including the risks, benefits and alternatives for the proposed anesthesia with the patient or authorized representative who has indicated his/her understanding and acceptance.       Plan Discussed with: CRNA, Anesthesiologist and Surgeon  Anesthesia Plan Comments:          Anesthesia Quick Evaluation

## 2022-07-06 NOTE — Discharge Instructions (Signed)
   [facility]  Discharge Instructions  AV Fistula or Graft Surgery for Dialysis Access  Please refer to the following instructions for your post-procedure care. Your surgeon or physician assistant will discuss any changes with you.  Activity  You may drive the day following your surgery, if you are comfortable and no longer taking prescription pain medication. Resume full activity as the soreness in your incision resolves.  Bathing/Showering  You may shower after you go home. Keep your incision dry for 48 hours. Do not soak in a bathtub, hot tub, or swim until the incision heals completely. You may not shower if you have a hemodialysis catheter.  Incision Care  Clean your incision with mild soap and water after 48 hours. Pat the area dry with a clean towel. You do not need a bandage unless otherwise instructed. Do not apply any ointments or creams to your incision. You may have skin glue on your incision. Do not peel it off. It will come off on its own in about one week. Your arm may swell a bit after surgery. To reduce swelling use pillows to elevate your arm so it is above your heart. Your doctor will tell you if you need to lightly wrap your arm with an ACE bandage.  Diet  Resume your normal diet. There are not special food restrictions following this procedure. In order to heal from your surgery, it is CRITICAL to get adequate nutrition. Your body requires vitamins, minerals, and protein. Vegetables are the best source of vitamins and minerals. Vegetables also provide the perfect balance of protein. Processed food has little nutritional value, so try to avoid this.  Medications  Resume taking all of your medications. If your incision is causing pain, you may take over-the counter pain relievers such as acetaminophen (Tylenol). If you were prescribed a stronger pain medication, please be aware these medications can cause nausea and constipation. Prevent  nausea by taking the medication with a snack or meal. Avoid constipation by drinking plenty of fluids and eating foods with high amount of fiber, such as fruits, vegetables, and grains.  Do not take Tylenol if you are taking prescription pain medications.  Follow up Your surgeon may want to see you in the office following your access surgery. If so, this will be arranged at the time of your surgery.  Please call us immediately for any of the following conditions:  Increased pain, redness, drainage (pus) from your incision site Fever of 101 degrees or higher Severe or worsening pain at your incision site Hand pain or numbness.  Reduce your risk of vascular disease:  Stop smoking. If you would like help, call [facility] at 1-800-QUIT-NOW 629-021-6810) or [place] at [place] your cholesterol Maintain a desired weight Control your diabetes Keep your blood pressure down  Dialysis  It will take several weeks to several months for your new dialysis access to be ready for use. Your surgeon will determine when it is okay to use it. Your nephrologist will continue to direct your dialysis. You can continue to use your Permcath until your new access is ready for use.   07/06/2022 Ashley Freeman [place] [id number] 19-Nov-1990  Surgeon(s): Marty Heck, MD  Procedure(s): Insertion AV graft right upper arm  x Do not stick graft for 4 weeks    If you have any questions, please call the office at [phone].

## 2022-07-06 NOTE — Progress Notes (Signed)
Inpatient Diabetes Program Recommendations  AACE/ADA: New Consensus Statement on Inpatient Glycemic Control (2015)  Target Ranges:  Prepandial:   less than 140 mg/dL      Peak postprandial:   less than 180 mg/dL (1-2 hours)      Critically ill patients:  140 - 180 mg/dL   Lab Results  Component Value Date   GLUCAP 117 (H) 07/06/2022   HGBA1C 8.5 (A) 03/15/2022    Review of Glycemic Control  Latest Reference Range & Units 07/06/22 07:58 07/06/22 08:42  Glucose-Capillary 70 - 99 mg/dL 56 (L) 117 (H)   Diabetes history: DM 1 Outpatient Diabetes medications:  Omnipod-  Pump   Omnipod Settings   Insulin type   NOVOLOG     Basal rate          0000 0.5    1100 0.6                I:C ratio          0000 10                       AIT  4 hrs            Sensitivity          0000  45           Goal          0000  120  Patient is currently not using auto mode.   Current orders for Inpatient glycemic control:  Novolog 0-14 units q 2 hours prn  Inpatient Diabetes Program Recommendations:    Patient low when arriving to hospital.  ? If patient is wearing insulin pump.  Will need insulin pump orders placed if patient is admitted.  Will follow.   Thanks,  Adah Perl, RN, BC-ADM Inpatient Diabetes Coordinator Pager [phone]  (8a-5p)

## 2022-07-06 NOTE — Op Note (Addendum)
OPERATIVE NOTE  DATE: July 06, 2022  PROCEDURE:  right upper arm arteriovenous graft (4 mm x 7 mm tapered Gore-Tex graft)   PRE-OPERATIVE DIAGNOSIS: end stage renal disease   POST-OPERATIVE DIAGNOSIS: same  SURGEON: Marty Heck, MD  ASSISTANT(S): Leontine Locket, PA  ANESTHESIA: regional  ESTIMATED BLOOD LOSS: Minimal  FINDING(S): Palpable thrill in the right upper arm AV graft at end of case Brisk dopplerable radial signal at the wrist at end of case.  SPECIMEN(S):  None  INDICATIONS:   Ashley Freeman is a 32 y.o. female who presents with end stage renal disease and the need for permanent hemodialysis access.  Risk, benefits, and alternatives to access surgery were discussed.  The patient is aware the risks include but are not limited to: bleeding, infection, steal syndrome, nerve damage, ischemic monomelic neuropathy, failure to mature, and need for additional procedures.  The patient is aware of the risks and elects to proceed forward.  DESCRIPTION: After full informed written consent was obtained from the patient, the patient was brought back to the operating room and placed supine upon the operating table.  The patient was given IV antibiotics prior to proceeding.  After obtaining adequate sedation, the patient was prepped and draped in standard fashion for a right arm access procedure.  I evaluated all of the surface veins with ultrasound and they were small as indicated on pre-operative vein mapping.  I also evaluated the brachial veins at the antecubital fossa and these appeared small as well.  I elected for upper arm AV graft.  I turned my attention first to the antecubitum.  Under ultrasound guidance, I identified the location of the brachial artery and marked it on the skin.  I then looked on the upper arm near the axilla and marked the brachial vein as well as axillary vein. I made a longitudinal incision over the brachial artery above the  antecubitum and another longitudinal incision over the brachial vein near the axillary vein.  I dissected down through the subcutaneous tissue and  fascia carefully and was able to dissect out the brachial artery.  The artery was about 3.5 mm externally.  It was controlled proximally and distally with vessel loops.  I then dissected out the larger brachial vein through the upper arm incision.  Externally, it appeared to be 4-5 mm in diameter.  I then dissected this vein proximally and distal.  I took a metal Gore tunneler and dissected from the antecubital incision to the axillary incision.  Then I delivered the 4 x 7-mm stretch Gore-Tex graft, through this metal tunneler and then pulled out the metal tunneler leaving the graft in place.  The 4-mm end was left on the brachial artery side and the 7-mm end toward the vein side.  I then gave the patient 3,000 units of heparin to gain anticoagulation.  After waiting 2 minutes, I placed the brachial artery under tension proximally and distally with vessel loops, made an arteriotomy and extended it with a Potts scissor.  I sewed the 4-mm end of the graft to this arteriotomy with a running stitch of 6-0 Prolene.  At this point, then I completed the anastomosis in the usual fashion.  I released the vessel loops on the inflow and allowed the artery to decompress through the graft. There was good pulsatile bleeding through this graft.  I clamped the graft near its arterial anastomosis and sucked out all the blood in the graft and loaded the graft with heparinized  saline.  At this point, I pulled the graft to appropriate length and reset my exposure of the high brachial vein. The vein was controlled with baby profunda clamp and opened with 11 blade scalpel and extended with Potts scissors.  There was good venous backbleeding from the vein. I spatulated the graft to facilitate an end-to-side anastomosis.  In the process of spatulating, I cut the graft to appropriate length for  this anastomosis.  This graft was sewn to the vein in an end-to-side configuration with a 6-0 Prolene.  Prior to completing this anastomosis, I allowed the vein to back bleed and then I also allowed the artery to bleed in an antegrade fashion.  I completed this anastomosis in the usual fashion.  I irrigated out both incisions.  The graft had a good palpable thrill.  The radial and ulnar artery has good signals.  The subcutaneous tissue in each incision was reapproximated with a running stitch of 3-0 Vicryl.  The skin was then reapproximated with a running subcuticular 4-0 Monocryl.  The skin was then cleaned, dried, and Dermabond used to reinforce the skin closure.   COMPLICATIONS: None  CONDITION: Stable   Marty Heck, MD [facility] Office: [place]    07/06/2022, 11:03 AM

## 2022-07-06 NOTE — H&P (Signed)
History and Physical Interval Note:  07/06/2022 8:56 AM  Ashley Freeman  has presented today for surgery, with the diagnosis of ESRD.  The various methods of treatment have been discussed with the patient and family. After consideration of risks, benefits and other options for treatment, the patient has consented to  Procedure(s): RIGHT ARM ARTERIOVENOUS (AV) FISTULA VERSUS ARTERIOVENOUS GRAFT CREATION (Right) as a surgical intervention.  The patient's history has been reviewed, patient examined, no change in status, stable for surgery.  I have reviewed the patient's chart and labs.  Questions were answered to the patient's satisfaction.    Discussed plan for right arm AV fistula versus graft.  Preoperative vein mapping suggest she has no surface veins.  She will get a block and we will reevaluate in the OR.  Marty Heck     Patient ID: Ashley Freeman, female   DOB: Jan 05, 1991, 32 y.o.   MRN: [id number]   Reason for Consult: No chief complaint on file.   Referred by Anthonette Legato, MD   Subjective:    Subjective  HPI:   Ashley Freeman is a 32 y.o. female with history of end-stage renal disease on dialysis via right IJ tunneled dialysis catheter on Tuesdays, Thursdays and Saturdays.  She does not take any blood thinners.  She is left-hand dominant no previous right arm surgeries.       Past Medical History:  Diagnosis Date   Abscess, gluteal, right 08/24/2013   AKI (acute kidney injury) ([place]) 07/26/2014   Anemia 02/19/2012   Bartholin's gland abscess 09/19/2013   BV (bacterial vaginosis) 11/24/2015   Diabetes mellitus type I ([place]) 2001    Diagnosed at age 17 ; Type I   Diarrhea 05/30/2016   DKA (diabetic ketoacidoses) 08/19/2013    Also in 2018   Gonorrhea 08/2011    Treated in 09/2011   History of trichomoniasis 05/31/2016   Hyperlipidemia 03/28/2016   Sepsis ([place]) 09/19/2013         Family History  Problem Relation Age of Onset    Asthma Mother     Carpal tunnel syndrome Mother     Gout Father     Diabetes Paternal Grandmother     Anesthesia problems Neg Hx           Past Surgical History:  Procedure Laterality Date   CESAREAN SECTION N/A 10/05/2019    Procedure: CESAREAN SECTION;  Surgeon: Aletha Halim, MD;  Location: [facility];  Service: Obstetrics;  Laterality: N/A;   INCISION AND DRAINAGE ABSCESS Left 09/28/2019    Procedure: INCISION AND DRAINAGE VULVAR ABCESS;  Surgeon: [name], MD;  Location: [place];  Service: Gynecology;  Laterality: Left;   INCISION AND DRAINAGE PERIRECTAL ABSCESS Right 08/18/2013    Procedure: IRRIGATION AND DEBRIDEMENT GLUTEAL ABSCESS;  Surgeon: Ralene Ok, MD;  Location: [place];  Service: General;  Laterality: Right;   INCISION AND DRAINAGE PERIRECTAL ABSCESS Right 09/19/2013    Procedure: IRRIGATION AND DEBRIDEMENT RIGHT GLUTEAL AND LABIAL ABSCESSES;  Surgeon: Ralene Ok, MD;  Location: [place];  Service: General;  Laterality: Right;   INCISION AND DRAINAGE PERIRECTAL ABSCESS Right 09/24/2013    Procedure: IRRIGATION AND DEBRIDEMENT PERIRECTAL ABSCESS;  Surgeon: Gwenyth Ober, MD;  Location: [place];  Service: General;  Laterality: Right;      Short Social History:  Social History        Tobacco Use   Smoking status: Never   Smokeless tobacco: Never  Substance Use Topics  Alcohol use: Not Currently           Allergies  Allergen Reactions   Cephalexin Anaphylaxis      Has gotten ceftriaxone in the past    Penicillins Hives and Rash      Has patient had a PCN reaction causing immediate rash, facial/tongue/throat swelling, SOB or lightheadedness with hypotension: Yes Has patient had a PCN reaction causing severe rash involving mucus membranes or skin necrosis: No Has patient had a PCN reaction that required hospitalization: Yes Has patient had a PCN reaction occurring within the last 10 years: No Spoke with pt - childhood hives told by mom, tried no pcns since,  doesn't remember reaction herself    Benadryl [Diphenhydramine] Itching   Doxycycline Itching            Current Outpatient Medications  Medication Sig Dispense Refill   Continuous Blood Gluc Sensor (DEXCOM G6 SENSOR) MISC Change sensors every 10 days 9 each 3   acetaminophen (TYLENOL) 500 MG tablet Take 1,000 mg by mouth every 6 (six) hours as needed for headache (pain).       amLODipine (NORVASC) 10 MG tablet Take 1 tablet (10 mg total) by mouth daily. 30 tablet 2   blood glucose meter kit and supplies KIT Dispense based on patient and insurance preference. Use up to four times daily as directed. (FOR ICD-9 250.00, 250.01). 1 each 0   Blood Pressure Monitoring (BLOOD PRESSURE KIT) DEVI 1 Device by Does not apply route as needed. 1 each 0   carvedilol (COREG) 25 MG tablet Take 1 tablet (25 mg total) by mouth 2 (two) times daily. 60 tablet 2   Continuous Blood Gluc Transmit (DEXCOM G6 TRANSMITTER) MISC Inject 1 Device into the skin as directed. Use to check blood sugar daily 1 each 3   gentamicin cream (GARAMYCIN) 0.1 % SMARTSIG:1 Topical Daily       insulin aspart (NOVOLOG) 100 UNIT/ML injection Max daily 50 units .Uses insulin pump (Patient taking differently: See admin instructions. Max daily 50 units .Use with Omnipod Insulin pump) 50 mL 3   Insulin Disposable Pump (OMNIPOD 5 G6 INTRO, GEN 5,) KIT Change pods every 3 days 1 kit 0   Insulin Disposable Pump (OMNIPOD 5 G6 POD, GEN 5,) MISC CHANGE EVERY 3 DAYS 30 each 1   Insulin Pen Needle 32G X 4 MM MISC Use as directed 200 each 0   lurasidone (LATUDA) 40 MG TABS tablet Take 40 mg by mouth daily with breakfast.       Prenatal Vit-Fe Fumarate-FA (PRENATAL MULTIVITAMIN) TABS tablet Take 1 tablet by mouth daily at 12 noon.        No current facility-administered medications for this visit.      Review of Systems  Constitutional:  Constitutional negative. HENT: HENT negative.  Eyes: Eyes negative.  Respiratory: Respiratory negative.   Cardiovascular: Cardiovascular negative.  GI: Gastrointestinal negative.  Musculoskeletal: Musculoskeletal negative.  Skin: Skin negative.  Neurological: Neurological negative. Hematologic: Hematologic/lymphatic negative.  Psychiatric: Psychiatric negative.          Objective:    Objective '[]'$ Expand by Default    Vitals:    05/09/22 1326  BP: 131/89  Pulse: 88  Resp: 20  Temp: 97.9 F (36.6 C)  SpO2: 95%      Physical Exam HENT:     Head: Normocephalic.     Nose: Nose normal.     Mouth/Throat:     Mouth: Mucous membranes are moist.  Eyes:  Pupils: Pupils are equal, round, and reactive to light.  Cardiovascular:     Rate and Rhythm: Normal rate.  Pulmonary:     Effort: Pulmonary effort is normal.  Abdominal:     General: Abdomen is flat.     Palpations: Abdomen is soft.  Musculoskeletal:        General: Normal range of motion.     Cervical back: Normal range of motion and neck supple.     Right lower leg: No edema.     Left lower leg: No edema.  Skin:    General: Skin is warm.     Capillary Refill: Capillary refill takes less than 2 seconds.  Neurological:     General: No focal deficit present.     Mental Status: She is alert.  Psychiatric:        Mood and Affect: Mood normal.        Behavior: Behavior normal.        Thought Content: Thought content normal.        Judgment: Judgment normal.        Data: Right Pre-Dialysis Findings:  +-----------------------+----------+--------------------+---------+--------  +  Location              PSV (cm/s)Intralum. Diam. (cm)Waveform  Comments  +-----------------------+----------+--------------------+---------+--------  +  Brachial Antecub. fossa57        0.45                triphasic           +-----------------------+----------+--------------------+---------+--------  +  Radial Art at Wrist    71        0.20                triphasic            +-----------------------+----------+--------------------+---------+--------  +  Ulnar Art at Wrist     63        0.15                triphasic           +-----------------------+----------+--------------------+---------+--------    Left Pre-Dialysis Findings:  +----------------------------+---------+-------------------+----------+----  ----+  Location                   PSV      Intralum. Diam.    Waveform   Comments                             (cm/s)   (cm)                                    +----------------------------+---------+-------------------+----------+----  ----+  Radial artery distal upper  58       0.3                triphasic            arm                                                                          +----------------------------+---------+-------------------+----------+----  ----+  Ulnar artery distal upper   52  0.3                triphasic            arm                                                                          +----------------------------+---------+-------------------+----------+----  ----+  Radial Art at Wrist         70       0.24               triphasic            +----------------------------+---------+-------------------+----------+----  ----+  Ulnar Art at Wrist          67       0.16               triphasic              Summary:    Right: Patent brachial, radial, and ulnar arteries.   Left: Patent radial and ulnar arteries. Bifurcation in the proximal upper arm.      Right Cephalic   Diameter (cm)Depth (cm)    Findings      +-----------------+-------------+----------+----------------+  Shoulder            0.22                                 +-----------------+-------------+----------+----------------+  Prox upper arm       0.23                                 +-----------------+-------------+----------+----------------+  Mid upper arm         0.14                                 +-----------------+-------------+----------+----------------+  Dist upper arm       0.13                                 +-----------------+-------------+----------+----------------+  Antecubital fossa    0.17                                 +-----------------+-------------+----------+----------------+  Prox forearm                            chronic thrombus  +-----------------+-------------+----------+----------------+  Dist forearm         0.13                not visualized   +-----------------+-------------+----------+----------------+   +-----------------+-------------+----------+--------+  Right Basilic    Diameter (cm)Depth (cm)Findings  +-----------------+-------------+----------+--------+  Mid upper arm        0.29                         +-----------------+-------------+----------+--------+  Dist upper arm  0.17                         +-----------------+-------------+----------+--------+  Antecubital fossa    0.14                         +-----------------+-------------+----------+--------+   +-----------------+-------------+----------+--------+  Left Cephalic    Diameter (cm)Depth (cm)Findings  +-----------------+-------------+----------+--------+  Shoulder            0.19                         +-----------------+-------------+----------+--------+  Prox upper arm       0.20                         +-----------------+-------------+----------+--------+  Mid upper arm        0.18                         +-----------------+-------------+----------+--------+  Dist upper arm       0.09                         +-----------------+-------------+----------+--------+  Antecubital fossa    0.10                         +-----------------+-------------+----------+--------+  Prox forearm         0.17                          +-----------------+-------------+----------+--------+  Mid forearm          0.10                         +-----------------+-------------+----------+--------+  Dist forearm         0.10                         +-----------------+-------------+----------+--------+   +-----------------+-------------+----------+--------------+  Left Basilic     Diameter (cm)Depth (cm)   Findings     +-----------------+-------------+----------+--------------+  Mid upper arm        0.20                               +-----------------+-------------+----------+--------------+  Dist upper arm       0.22                               +-----------------+-------------+----------+--------------+  Antecubital fossa                       not visualized  +-----------------+-------------+----------+--------------+   Summary:  Right: Patent small cephalic vein to the proximal forearm. Patent small basilic vein.  Left: Patent small cephalic and basilic veins.         Assessment/Plan:  32 year old female with end-stage renal disease currently dialyzing via catheter.  She does not appear to have suitable surface veins on either extremity and she is left-hand dominant.  Will plan for AV fistula versus graft in the right upper extremity on a nondialysis day in the near future.  We discussed the risk benefits alternatives he demonstrates good understanding.     Erlene Quan  Dione Plover MD [facility]

## 2022-07-06 NOTE — Transfer of Care (Signed)
Immediate Anesthesia Transfer of Care Note  Patient: Ashley Freeman  Procedure(s) Performed: ARTERIOVENOUS GRAFT CREATION (Right: Arm Upper)  Patient Location: PACU  Anesthesia Type:MAC combined with regional for post-op pain  Level of Consciousness: drowsy and patient cooperative  Airway & Oxygen Therapy: Patient Spontanous Breathing  Post-op Assessment: Report given to RN and Post -op Vital signs reviewed and stable  Post vital signs: Reviewed and stable  Last Vitals:  Vitals Value Taken Time  BP 134/110 07/06/22 1103  Temp    Pulse 98 07/06/22 1105  Resp 17 07/06/22 1105  SpO2 93 % 07/06/22 1105  Vitals shown include unvalidated device data.  Last Pain:  Vitals:   07/06/22 0752  TempSrc: Oral  PainSc: 0-No pain         Complications: No notable events documented.

## 2022-07-06 NOTE — Anesthesia Procedure Notes (Signed)
Anesthesia Regional Block: Supraclavicular block   Pre-Anesthetic Checklist: , timeout performed,  Correct Patient, Correct Site, Correct Laterality,  Correct Procedure, Correct Position, site marked,  Risks and benefits discussed,  Surgical consent,  Pre-op evaluation,  At surgeon's request and post-op pain management  Laterality: Right  Prep: chloraprep       Needles:  Injection technique: Single-shot  Needle Type: Echogenic Needle     Needle Length: 5cm  Needle Gauge: 21     Additional Needles:   Narrative:  Start time: 07/06/2022 9:03 AM End time: 07/06/2022 9:07 AM Injection made incrementally with aspirations every 5 mL.  Performed by: Personally  Anesthesiologist: Audry Pili, MD  Additional Notes: No pain on injection. No increased resistance to injection. Injection made in 5cc increments. Good needle visualization. Patient tolerated the procedure well.

## 2022-07-06 NOTE — Anesthesia Postprocedure Evaluation (Signed)
Anesthesia Post Note  Patient: Ashley Freeman  Procedure(s) Performed: ARTERIOVENOUS GRAFT CREATION (Right: Arm Upper)     Patient location during evaluation: PACU Anesthesia Type: Regional Level of consciousness: awake and alert Pain management: pain level controlled Vital Signs Assessment: post-procedure vital signs reviewed and stable Respiratory status: spontaneous breathing, nonlabored ventilation and respiratory function stable Cardiovascular status: stable and blood pressure returned to baseline Anesthetic complications: no   No notable events documented.  Last Vitals:  Vitals:   07/06/22 1103 07/06/22 1118  BP: (!) 134/110 (!) 155/107  Pulse: 99 96  Resp: 17 19  Temp: 36.8 C 36.8 C  SpO2: 94% 97%    Last Pain:  Vitals:   07/06/22 1103  TempSrc:   PainSc: 0-No pain                 Audry Pili

## 2022-07-07 ENCOUNTER — Encounter (HOSPITAL_COMMUNITY): Payer: Self-pay | Admitting: Vascular Surgery

## 2022-07-09 ENCOUNTER — Other Ambulatory Visit: Payer: Self-pay | Admitting: Internal Medicine

## 2022-07-09 ENCOUNTER — Other Ambulatory Visit: Payer: Medicaid Other

## 2022-07-09 DIAGNOSIS — E1065 Type 1 diabetes mellitus with hyperglycemia: Secondary | ICD-10-CM

## 2022-07-10 ENCOUNTER — Telehealth: Payer: Self-pay | Admitting: Physician Assistant

## 2022-07-10 ENCOUNTER — Telehealth: Payer: Self-pay

## 2022-07-10 ENCOUNTER — Other Ambulatory Visit: Payer: Self-pay | Admitting: Physician Assistant

## 2022-07-10 MED ORDER — OXYCODONE-ACETAMINOPHEN 5-325 MG PO TABS: 1.0000 | ORAL_TABLET | ORAL | 0 refills | Status: DC | PRN

## 2022-07-10 NOTE — Telephone Encounter (Signed)
-----   Message from Gabriel Earing, [place] sent at 07/06/2022 11:00 AM EST ----- S/p RUA AVG 3/8.  Please have pt f/u in a couple of weeks for incision checks.  Thanks. (No studies)

## 2022-07-10 NOTE — Telephone Encounter (Signed)
Patient needs PA for Dexcom G6

## 2022-07-10 NOTE — Progress Notes (Signed)
Pt called today stating insurance would not cover OxyIR.  PDMP reviewed and this was not filled.  New prescription for Percocet 5/325 one q6h prn pain #15 no refill sent to [facility] at [facility].     Leontine Locket, [facility] 07/10/2022 10:54 AM

## 2022-07-13 ENCOUNTER — Other Ambulatory Visit: Payer: Medicaid Other

## 2022-07-16 ENCOUNTER — Other Ambulatory Visit: Payer: Self-pay

## 2022-07-17 ENCOUNTER — Encounter: Payer: Medicaid Other | Attending: Internal Medicine | Admitting: Nutrition

## 2022-07-17 ENCOUNTER — Other Ambulatory Visit (INDEPENDENT_AMBULATORY_CARE_PROVIDER_SITE_OTHER): Payer: Medicaid Other

## 2022-07-17 ENCOUNTER — Other Ambulatory Visit (HOSPITAL_COMMUNITY): Payer: Self-pay

## 2022-07-17 DIAGNOSIS — Z713 Dietary counseling and surveillance: Secondary | ICD-10-CM | POA: Insufficient documentation

## 2022-07-17 DIAGNOSIS — E1065 Type 1 diabetes mellitus with hyperglycemia: Secondary | ICD-10-CM | POA: Diagnosis not present

## 2022-07-17 DIAGNOSIS — Z992 Dependence on renal dialysis: Secondary | ICD-10-CM | POA: Diagnosis not present

## 2022-07-17 DIAGNOSIS — E1022 Type 1 diabetes mellitus with diabetic chronic kidney disease: Secondary | ICD-10-CM | POA: Diagnosis not present

## 2022-07-17 DIAGNOSIS — N186 End stage renal disease: Secondary | ICD-10-CM | POA: Diagnosis not present

## 2022-07-17 LAB — GLUCOSE, RANDOM: Glucose, Bld: 153 mg/dL — ABNORMAL HIGH (ref 70–99)

## 2022-07-17 NOTE — Progress Notes (Signed)
Patient reports that she is on dialysis now 3 days/wk, and is limiting her fruits and carbs per "dialysis diet". Diet:  reports breakfast and lunch is always eaten out and supper is 1/2 the time eaten out.  Does not count carbs, says used to do this, but has forgotten.   Typical day: 7AM: up gets daughter ready for school  takes 16u Lantus 9AM: 5 pieces of sausage links, 6 ounces OJ.  Takes 2u for this.  Says readings are usually below 120 4 hr. Pc          Or 3 sausage patties and grits and 8 ounces OJ  4u for this.  Says readings are usuually below 150 before the lunch meal at 2 PM 2PM:  Blood sugars usually in 70s.           4 pieces of meat lovers pizza--will have thin or regular crust, a serving of bread sticks, 4 ounces of water, and will take 7u for this meal.  Says readings are            Usually around 150 acS. 7PM  salad with protein of chicken or steak with ranch or [place] dressing--no crackers or any other carbohydrate of any kind.      takes 2u for this 9PM: occasional snacking but no extra insulin given Discussion:    Importance of always taking Lantus--this is 1/2 of her daily insulin dose Importance of always taking  Novolog before all meals and snacks--appropriate for the amount of carbs eaten.. She says she used to count carbs, but does not do this now--stressed the importance of doing this, and will come back to review this with the dietitian for consult. Importance of having a balanced meal of protein, carbs and fats, and why each are needed, and effects on blood sugar readings.  Patient says she is wanting to take better care of herself for her daughter.  Will message Dr. Kelton Pillar for consult for carb counting and possible pump discussion.  NO time at this visit for these topics.

## 2022-07-18 LAB — C-PEPTIDE: C-Peptide: 0.18 ng/mL — ABNORMAL LOW (ref 0.80–3.85)

## 2022-07-23 ENCOUNTER — Other Ambulatory Visit: Payer: Self-pay | Admitting: Internal Medicine

## 2022-07-23 DIAGNOSIS — E1065 Type 1 diabetes mellitus with hyperglycemia: Secondary | ICD-10-CM

## 2022-07-23 NOTE — Patient Instructions (Signed)
Always take Lantus dose every day Always take Novolog before eaten any meal and snack. Always take blood sugar and adjust the Novolog dose for high readings:  add 1 extra until for every 50 points over 150.

## 2022-07-24 ENCOUNTER — Ambulatory Visit: Payer: Medicaid Other | Admitting: Internal Medicine

## 2022-07-31 ENCOUNTER — Ambulatory Visit (INDEPENDENT_AMBULATORY_CARE_PROVIDER_SITE_OTHER): Payer: Medicaid Other | Admitting: Physician Assistant

## 2022-07-31 VITALS — BP 137/87 | HR 98 | Temp 97.7°F | Resp 20 | Ht 63.0 in | Wt 132.1 lb

## 2022-07-31 DIAGNOSIS — N186 End stage renal disease: Secondary | ICD-10-CM

## 2022-07-31 NOTE — Progress Notes (Signed)
POST OPERATIVE OFFICE NOTE    CC:  F/u for surgery  HPI:  Ashley Freeman is a 32 y.o. female who is s/p creation of right upper extremity AV graft on 07/06/2022 by Dr. Carlis Abbott.  This was done for permanent dialysis access.  She currently dialyzes via right IJ TDC on Tuesdays, Thursdays, and Saturdays.  Pt returns today for follow up.  Pt states she has been doing well since surgery she denies any issues with her right arm incision.  She occasionally has numbness in her right thumb and pointer finger during dialysis, but this is nonbothersome.  She denies any excessive coldness, pain, or difficulty with grip strength in the right hand since surgery.    Allergies  Allergen Reactions   Cephalexin Anaphylaxis    Has gotten ceftriaxone in the past    Penicillins Hives and Rash    Has patient had a PCN reaction causing immediate rash, facial/tongue/throat swelling, SOB or lightheadedness with hypotension: Yes Has patient had a PCN reaction causing severe rash involving mucus membranes or skin necrosis: No Has patient had a PCN reaction that required hospitalization: Yes Has patient had a PCN reaction occurring within the last 10 years: No Spoke with pt - childhood hives told by mom, tried no pcns since, doesn't remember reaction herself    Benadryl [Diphenhydramine] Itching   Doxycycline Itching    Current Outpatient Medications  Medication Sig Dispense Refill   acetaminophen (TYLENOL) 500 MG tablet Take 1,000 mg by mouth every 6 (six) hours as needed for headache (pain).     albuterol (VENTOLIN HFA) 108 (90 Base) MCG/ACT inhaler Inhale 2 puffs into the lungs every 4 (four) hours as needed for wheezing or shortness of breath.     blood glucose meter kit and supplies KIT Dispense based on patient and insurance preference. Use up to four times daily as directed. (FOR ICD-9 250.00, 250.01). 1 each 0   Blood Pressure Monitoring (BLOOD PRESSURE KIT) DEVI 1 Device by Does not apply route as  needed. 1 each 0   Continuous Blood Gluc Sensor (DEXCOM G6 SENSOR) MISC Change sensors every 10 days 9 each 3   Continuous Blood Gluc Transmit (DEXCOM G6 TRANSMITTER) MISC Inject 1 Device into the skin as directed. Use to check blood sugar daily 1 each 3   ferrous sulfate 324 (65 Fe) MG TBEC Take 324 mg by mouth daily.     furosemide (LASIX) 20 MG tablet Take 20 mg by mouth daily.     insulin aspart (NOVOLOG) 100 UNIT/ML injection Max daily 50 units .Uses insulin pump (Patient taking differently: Max daily 150 units .Uses insulin pump) 50 mL 3   Insulin Disposable Pump (OMNIPOD 5 G6 INTRO, GEN 5,) KIT Change pods every 3 days 1 kit 0   Insulin Disposable Pump (OMNIPOD 5 G6 POD, GEN 5,) MISC CHANGE EVERY 3 DAYS 30 each 1   Insulin Pen Needle 32G X 4 MM MISC Use as directed 200 each 0   lamoTRIgine (LAMICTAL) 25 MG tablet Take 25 mg by mouth daily.     LORazepam (ATIVAN) 0.5 MG tablet Take by mouth.     lurasidone (LATUDA) 40 MG TABS tablet Take 40 mg by mouth daily with breakfast.     oxyCODONE-acetaminophen (PERCOCET/ROXICET) 5-325 MG tablet Take 1 tablet by mouth every 4 (four) hours as needed for severe pain. 15 tablet 0   rosuvastatin (CRESTOR) 40 MG tablet Take 40 mg by mouth daily.     sevelamer carbonate (RENVELA) 800  MG tablet Take 1,600 mg by mouth 3 (three) times daily with meals.     SUMAtriptan (IMITREX) 50 MG tablet Take 50 mg by mouth every 2 (two) hours as needed for migraine or headache.     torsemide (DEMADEX) 20 MG tablet Take 20 mg by mouth daily.     Vitamin D, Ergocalciferol, (DRISDOL) 1.25 MG (50000 UNIT) CAPS capsule Take 50,000 Units by mouth every 7 (seven) days.     amLODipine (NORVASC) 10 MG tablet Take 1 tablet (10 mg total) by mouth daily. 30 tablet 2   carvedilol (COREG) 25 MG tablet Take 1 tablet (25 mg total) by mouth 2 (two) times daily. 60 tablet 2   No current facility-administered medications for this visit.     ROS:  See HPI  Physical Exam:  Incision:  Right upper arm incisions nearly healed and intact Extremities: Brisk right radial Doppler signal.  Great thrill on palpation of right upper extremity AV graft Neuro: Intact motor and sensation of right upper extremity    Assessment/Plan:  This is a 32 y.o. female who is s/p: Creation of right upper arm AV graft on 07/06/2022   -The patient has been doing well since surgery. Her right arm incisions are nearly healed without signs of infection or bleeding -She denies any excessive coldness, pain, or difficulty with movement in the right hand. She occasionally has tingling in her right thumb and pointer finger during dialysis but this is not bothersome to her -On exam she has a brisk right radial doppler signal. The right upper arm AVG has a palpable thrill and great bruit throughout the course of the upper arm. -Given that her surgery was on 3/8, her AVG should be ready for use by May 3rd. I have explained that if her AVG can be used successfully and without any issues at least 2-3 times, her nephrologists can arrange removal of her right IJ TDC. -She can follow up with our office as needed   Vicente Serene, PA-C [facility] [phone]   Clinic MD: Roxanne Mins

## 2022-08-02 ENCOUNTER — Encounter: Payer: Self-pay | Admitting: Internal Medicine

## 2022-08-14 ENCOUNTER — Other Ambulatory Visit: Payer: Self-pay

## 2022-08-14 MED ORDER — DEXCOM G7 SENSOR MISC: 3 refills | Status: DC

## 2022-08-21 ENCOUNTER — Ambulatory Visit: Payer: Medicaid Other | Admitting: Nutrition

## 2022-08-24 ENCOUNTER — Telehealth: Payer: Self-pay | Admitting: Dietician

## 2022-08-24 NOTE — Telephone Encounter (Signed)
Patient called to change her pump training appointment.  Chart reviewed, she was not on the trainer's schedule.  She needs training on the Tandem pump and uses the Dexcom G7.  Instructed her to bring all pump supplies to her appointment as well as insulin in a vial and a new CGM sensor if needed.  Appointment made for 08/29/2022.  Oran Rein, RD, LDN, CDCES

## 2022-08-29 ENCOUNTER — Ambulatory Visit: Payer: Medicaid Other | Admitting: Nutrition

## 2022-08-30 ENCOUNTER — Encounter: Payer: Self-pay | Admitting: Internal Medicine

## 2022-08-30 DIAGNOSIS — E1065 Type 1 diabetes mellitus with hyperglycemia: Secondary | ICD-10-CM

## 2022-09-05 ENCOUNTER — Telehealth: Payer: Self-pay | Admitting: Nutrition

## 2022-09-05 NOTE — Telephone Encounter (Signed)
LVM of times I have available for trainings next week. Explained that she has canceled X4 and that this is the last time I will schedule.  I will then transfer her to the Tandem trainer if she is late or a now show.

## 2022-09-07 ENCOUNTER — Inpatient Hospital Stay: Payer: Medicaid Other

## 2022-09-07 ENCOUNTER — Emergency Department: Payer: Medicaid Other

## 2022-09-07 ENCOUNTER — Encounter: Payer: Self-pay | Admitting: *Deleted

## 2022-09-07 ENCOUNTER — Telehealth: Payer: Self-pay | Admitting: Internal Medicine

## 2022-09-07 ENCOUNTER — Other Ambulatory Visit: Payer: Self-pay | Admitting: Internal Medicine

## 2022-09-07 ENCOUNTER — Inpatient Hospital Stay
Admission: EM | Admit: 2022-09-07 | Discharge: 2022-09-08 | DRG: 637 | Discharge status: Against Medical Advice | Payer: Medicaid Other | Attending: Student in an Organized Health Care Education/Training Program | Admitting: Student in an Organized Health Care Education/Training Program

## 2022-09-07 DIAGNOSIS — E10649 Type 1 diabetes mellitus with hypoglycemia without coma: Secondary | ICD-10-CM | POA: Diagnosis present

## 2022-09-07 DIAGNOSIS — J9601 Acute respiratory failure with hypoxia: Secondary | ICD-10-CM | POA: Diagnosis present

## 2022-09-07 DIAGNOSIS — Z825 Family history of asthma and other chronic lower respiratory diseases: Secondary | ICD-10-CM | POA: Diagnosis not present

## 2022-09-07 DIAGNOSIS — F319 Bipolar disorder, unspecified: Secondary | ICD-10-CM | POA: Diagnosis present

## 2022-09-07 DIAGNOSIS — D631 Anemia in chronic kidney disease: Secondary | ICD-10-CM | POA: Diagnosis present

## 2022-09-07 DIAGNOSIS — R404 Transient alteration of awareness: Secondary | ICD-10-CM | POA: Diagnosis not present

## 2022-09-07 DIAGNOSIS — Z880 Allergy status to penicillin: Secondary | ICD-10-CM

## 2022-09-07 DIAGNOSIS — Z79899 Other long term (current) drug therapy: Secondary | ICD-10-CM

## 2022-09-07 DIAGNOSIS — N186 End stage renal disease: Secondary | ICD-10-CM | POA: Diagnosis not present

## 2022-09-07 DIAGNOSIS — E1022 Type 1 diabetes mellitus with diabetic chronic kidney disease: Secondary | ICD-10-CM | POA: Diagnosis present

## 2022-09-07 DIAGNOSIS — I132 Hypertensive heart and chronic kidney disease with heart failure and with stage 5 chronic kidney disease, or end stage renal disease: Secondary | ICD-10-CM | POA: Diagnosis present

## 2022-09-07 DIAGNOSIS — Z91148 Patient's other noncompliance with medication regimen for other reason: Secondary | ICD-10-CM

## 2022-09-07 DIAGNOSIS — I5023 Acute on chronic systolic (congestive) heart failure: Secondary | ICD-10-CM | POA: Diagnosis present

## 2022-09-07 DIAGNOSIS — E11649 Type 2 diabetes mellitus with hypoglycemia without coma: Secondary | ICD-10-CM | POA: Diagnosis not present

## 2022-09-07 DIAGNOSIS — I429 Cardiomyopathy, unspecified: Secondary | ICD-10-CM | POA: Diagnosis not present

## 2022-09-07 DIAGNOSIS — G4089 Other seizures: Secondary | ICD-10-CM | POA: Diagnosis present

## 2022-09-07 DIAGNOSIS — Z881 Allergy status to other antibiotic agents status: Secondary | ICD-10-CM | POA: Diagnosis not present

## 2022-09-07 DIAGNOSIS — I428 Other cardiomyopathies: Secondary | ICD-10-CM | POA: Diagnosis present

## 2022-09-07 DIAGNOSIS — Z5986 Financial insecurity: Secondary | ICD-10-CM

## 2022-09-07 DIAGNOSIS — Z992 Dependence on renal dialysis: Secondary | ICD-10-CM

## 2022-09-07 DIAGNOSIS — I5020 Unspecified systolic (congestive) heart failure: Secondary | ICD-10-CM

## 2022-09-07 DIAGNOSIS — Z794 Long term (current) use of insulin: Secondary | ICD-10-CM

## 2022-09-07 DIAGNOSIS — R4182 Altered mental status, unspecified: Secondary | ICD-10-CM | POA: Diagnosis present

## 2022-09-07 DIAGNOSIS — E10641 Type 1 diabetes mellitus with hypoglycemia with coma: Secondary | ICD-10-CM | POA: Diagnosis not present

## 2022-09-07 DIAGNOSIS — J811 Chronic pulmonary edema: Secondary | ICD-10-CM | POA: Diagnosis present

## 2022-09-07 DIAGNOSIS — I420 Dilated cardiomyopathy: Secondary | ICD-10-CM

## 2022-09-07 DIAGNOSIS — E785 Hyperlipidemia, unspecified: Secondary | ICD-10-CM | POA: Diagnosis present

## 2022-09-07 DIAGNOSIS — Z833 Family history of diabetes mellitus: Secondary | ICD-10-CM | POA: Diagnosis not present

## 2022-09-07 DIAGNOSIS — E876 Hypokalemia: Principal | ICD-10-CM | POA: Diagnosis present

## 2022-09-07 DIAGNOSIS — Z5329 Procedure and treatment not carried out because of patient's decision for other reasons: Secondary | ICD-10-CM | POA: Diagnosis not present

## 2022-09-07 DIAGNOSIS — Z888 Allergy status to other drugs, medicaments and biological substances status: Secondary | ICD-10-CM

## 2022-09-07 DIAGNOSIS — R569 Unspecified convulsions: Secondary | ICD-10-CM

## 2022-09-07 DIAGNOSIS — Z9641 Presence of insulin pump (external) (internal): Secondary | ICD-10-CM | POA: Diagnosis present

## 2022-09-07 DIAGNOSIS — E162 Hypoglycemia, unspecified: Secondary | ICD-10-CM

## 2022-09-07 HISTORY — DX: Other cardiomyopathies: I42.8

## 2022-09-07 HISTORY — DX: Unspecified systolic (congestive) heart failure: I50.20

## 2022-09-07 HISTORY — DX: End stage renal disease: N18.6

## 2022-09-07 LAB — URINALYSIS, W/ REFLEX TO CULTURE (INFECTION SUSPECTED)
Bilirubin Urine: NEGATIVE
Glucose, UA: 150 mg/dL — AB
Hgb urine dipstick: NEGATIVE
Ketones, ur: NEGATIVE mg/dL
Leukocytes,Ua: NEGATIVE
Nitrite: NEGATIVE
Protein, ur: >=300 mg/dL — AB
Specific Gravity, Urine: 1.013 (ref 1.005–1.030)
pH: 8 (ref 5.0–8.0)

## 2022-09-07 LAB — GLUCOSE, CAPILLARY
Glucose-Capillary: 138 mg/dL — ABNORMAL HIGH (ref 70–99)
Glucose-Capillary: 170 mg/dL — ABNORMAL HIGH (ref 70–99)
Glucose-Capillary: 47 mg/dL — ABNORMAL LOW (ref 70–99)
Glucose-Capillary: <10 mg/dL — CL (ref 70–99)

## 2022-09-07 LAB — URINE DRUG SCREEN, QUALITATIVE (ARMC ONLY)
Amphetamines, Ur Screen: NOT DETECTED
Barbiturates, Ur Screen: NOT DETECTED
Benzodiazepine, Ur Scrn: NOT DETECTED
Cannabinoid 50 Ng, Ur ~~LOC~~: NOT DETECTED
Cocaine Metabolite,Ur ~~LOC~~: NOT DETECTED
MDMA (Ecstasy)Ur Screen: NOT DETECTED
Methadone Scn, Ur: NOT DETECTED
Opiate, Ur Screen: NOT DETECTED
Phencyclidine (PCP) Ur S: NOT DETECTED
Tricyclic, Ur Screen: NOT DETECTED

## 2022-09-07 LAB — CBG MONITORING, ED
Glucose-Capillary: 66 mg/dL — ABNORMAL LOW (ref 70–99)
Glucose-Capillary: 73 mg/dL (ref 70–99)
Glucose-Capillary: 93 mg/dL (ref 70–99)
Glucose-Capillary: 31 mg/dL — CL (ref 70–99)
Glucose-Capillary: 73 mg/dL (ref 70–99)
Glucose-Capillary: 122 mg/dL — ABNORMAL HIGH (ref 70–99)
Glucose-Capillary: 16 mg/dL — CL (ref 70–99)

## 2022-09-07 LAB — MAGNESIUM: Magnesium: 2.8 mg/dL — ABNORMAL HIGH (ref 1.7–2.4)

## 2022-09-07 LAB — COMPREHENSIVE METABOLIC PANEL
ALT: 43 U/L (ref 0–44)
AST: 33 U/L (ref 15–41)
Albumin: 3.3 g/dL — ABNORMAL LOW (ref 3.5–5.0)
Alkaline Phosphatase: 233 U/L — ABNORMAL HIGH (ref 38–126)
Anion gap: 12 (ref 5–15)
BUN: 36 mg/dL — ABNORMAL HIGH (ref 6–20)
CO2: 25 mmol/L (ref 22–32)
Calcium: 8.4 mg/dL — ABNORMAL LOW (ref 8.9–10.3)
Chloride: 103 mmol/L (ref 98–111)
Creatinine, Ser: 6.17 mg/dL — ABNORMAL HIGH (ref 0.44–1.00)
GFR, Estimated: 9 mL/min — ABNORMAL LOW (ref >60)
Glucose, Bld: 77 mg/dL (ref 70–99)
Potassium: 2.6 mmol/L — CL (ref 3.5–5.1)
Sodium: 140 mmol/L (ref 135–145)
Total Bilirubin: 0.8 mg/dL (ref 0.3–1.2)
Total Protein: 6.4 g/dL — ABNORMAL LOW (ref 6.5–8.1)

## 2022-09-07 LAB — BLOOD GAS, VENOUS
Acid-Base Excess: 2.7 mmol/L — ABNORMAL HIGH (ref 0.0–2.0)
Bicarbonate: 28.9 mmol/L — ABNORMAL HIGH (ref 20.0–28.0)
O2 Saturation: 89.8 %
Patient temperature: 37
pCO2, Ven: 50 mmHg (ref 44–60)
pH, Ven: 7.37 (ref 7.25–7.43)
pO2, Ven: 60 mmHg — ABNORMAL HIGH (ref 32–45)

## 2022-09-07 LAB — MRSA NEXT GEN BY PCR, NASAL: MRSA by PCR Next Gen: NOT DETECTED

## 2022-09-07 LAB — CBC WITH DIFFERENTIAL/PLATELET
Abs Immature Granulocytes: 0.05 10*3/uL (ref 0.00–0.07)
Basophils Absolute: 0 10*3/uL (ref 0.0–0.1)
Basophils Relative: 0 %
Eosinophils Absolute: 0.9 10*3/uL — ABNORMAL HIGH (ref 0.0–0.5)
Eosinophils Relative: 12 %
HCT: 43.1 % (ref 36.0–46.0)
Hemoglobin: 12.9 g/dL (ref 12.0–15.0)
Immature Granulocytes: 1 %
Lymphocytes Relative: 24 %
Lymphs Abs: 1.8 10*3/uL (ref 0.7–4.0)
MCH: 25.7 pg — ABNORMAL LOW (ref 26.0–34.0)
MCHC: 29.9 g/dL — ABNORMAL LOW (ref 30.0–36.0)
MCV: 85.9 fL (ref 80.0–100.0)
Monocytes Absolute: 0.8 10*3/uL (ref 0.1–1.0)
Monocytes Relative: 11 %
Neutro Abs: 3.9 10*3/uL (ref 1.7–7.7)
Neutrophils Relative %: 52 %
Platelets: 175 10*3/uL (ref 150–400)
RBC: 5.02 MIL/uL (ref 3.87–5.11)
RDW: 17.3 % — ABNORMAL HIGH (ref 11.5–15.5)
WBC: 7.4 10*3/uL (ref 4.0–10.5)
nRBC: 0 % (ref 0.0–0.2)

## 2022-09-07 LAB — LACTIC ACID, PLASMA
Lactic Acid, Venous: 1 mmol/L (ref 0.5–1.9)
Lactic Acid, Venous: 1.6 mmol/L (ref 0.5–1.9)

## 2022-09-07 LAB — POC URINE PREG, ED: Preg Test, Ur: NEGATIVE

## 2022-09-07 LAB — ETHANOL: Alcohol, Ethyl (B): <10 mg/dL (ref <10)

## 2022-09-07 LAB — SALICYLATE LEVEL: Salicylate Lvl: <7 mg/dL — ABNORMAL LOW (ref 7.0–30.0)

## 2022-09-07 LAB — HCG, QUANTITATIVE, PREGNANCY: hCG, Beta Chain, Quant, S: 2 m[IU]/mL (ref <5)

## 2022-09-07 LAB — LIPASE, BLOOD: Lipase: 21 U/L (ref 11–51)

## 2022-09-07 LAB — ACETAMINOPHEN LEVEL: Acetaminophen (Tylenol), Serum: <10 ug/mL — ABNORMAL LOW (ref 10–30)

## 2022-09-07 LAB — BRAIN NATRIURETIC PEPTIDE: B Natriuretic Peptide: 2669.4 pg/mL — ABNORMAL HIGH (ref 0.0–100.0)

## 2022-09-07 MED ORDER — POTASSIUM CHLORIDE 10 MEQ/100ML IV SOLN
10.0000 meq | INTRAVENOUS | Status: AC
  Administered 2022-09-07 – 2022-09-08 (×4): 10 meq via INTRAVENOUS
  Filled 2022-09-07 (×4): qty 100

## 2022-09-07 MED ORDER — DOCUSATE SODIUM 100 MG PO CAPS: 100.0000 mg | ORAL_CAPSULE | Freq: Two times a day (BID) | ORAL | Status: DC | PRN

## 2022-09-07 MED ORDER — DEXTROSE 50 % IV SOLN: 25.0000 g | INTRAVENOUS | Status: AC

## 2022-09-07 MED ORDER — LEVETIRACETAM IN NACL 500 MG/100ML IV SOLN
500.0000 mg | Freq: Two times a day (BID) | INTRAVENOUS | Status: DC
  Filled 2022-09-07: qty 100

## 2022-09-07 MED ORDER — LORAZEPAM 2 MG/ML IJ SOLN: 4.0000 mg | INTRAMUSCULAR | Status: DC | PRN

## 2022-09-07 MED ORDER — CHLORHEXIDINE GLUCONATE CLOTH 2 % EX PADS
6.0000 | MEDICATED_PAD | Freq: Every day | CUTANEOUS | Status: DC
  Administered 2022-09-07 – 2022-09-08 (×2): 6 via TOPICAL

## 2022-09-07 MED ORDER — ORAL CARE MOUTH RINSE
15.0000 mL | OROMUCOSAL | Status: DC
  Administered 2022-09-07 – 2022-09-08 (×3): 15 mL via OROMUCOSAL

## 2022-09-07 MED ORDER — DEXTROSE 10 % IV SOLN: INTRAVENOUS | Status: DC

## 2022-09-07 MED ORDER — DEXTROSE 50 % IV SOLN
INTRAVENOUS | Status: AC
  Administered 2022-09-07: 50 mL
  Filled 2022-09-07: qty 50

## 2022-09-07 MED ORDER — POLYETHYLENE GLYCOL 3350 17 G PO PACK: 17.0000 g | PACK | Freq: Every day | ORAL | Status: DC | PRN

## 2022-09-07 MED ORDER — SODIUM CHLORIDE 0.9 % IV BOLUS: 1000.0000 mL | Freq: Once | INTRAVENOUS | Status: DC

## 2022-09-07 MED ORDER — POTASSIUM CHLORIDE 10 MEQ/100ML IV SOLN
10.0000 meq | INTRAVENOUS | Status: AC
  Administered 2022-09-07 (×2): 10 meq via INTRAVENOUS
  Filled 2022-09-07 (×2): qty 100

## 2022-09-07 MED ORDER — DEXTROSE 50 % IV SOLN
INTRAVENOUS | Status: AC
  Filled 2022-09-07: qty 50

## 2022-09-07 MED ORDER — LORAZEPAM 2 MG/ML IJ SOLN
INTRAMUSCULAR | Status: AC
  Administered 2022-09-07: 2 mg
  Filled 2022-09-07: qty 1

## 2022-09-07 MED ORDER — POTASSIUM CHLORIDE 20 MEQ PO PACK: 40.0000 meq | PACK | ORAL | Status: DC

## 2022-09-07 MED ORDER — DEXTROSE 50 % IV SOLN
INTRAVENOUS | Status: AC
  Administered 2022-09-07: 25 g via INTRAVENOUS
  Filled 2022-09-07: qty 50

## 2022-09-07 MED ORDER — SODIUM CHLORIDE 0.9 % IV SOLN
75.0000 mL/h | INTRAVENOUS | Status: DC
  Administered 2022-09-07 – 2022-09-08 (×2): 75 mL/h via INTRAVENOUS

## 2022-09-07 MED ORDER — DEXCOM G7 SENSOR MISC: 3 refills | Status: DC

## 2022-09-07 MED ORDER — POTASSIUM CHLORIDE 20 MEQ PO PACK: 40.0000 meq | PACK | Freq: Once | ORAL | Status: DC

## 2022-09-07 MED ORDER — LEVETIRACETAM IN NACL 1000 MG/100ML IV SOLN
1000.0000 mg | Freq: Once | INTRAVENOUS | Status: AC
  Administered 2022-09-07: 1000 mg via INTRAVENOUS
  Filled 2022-09-07: qty 100

## 2022-09-07 MED ORDER — DEXTROSE 50 % IV SOLN: 25.0000 g | Freq: Once | INTRAVENOUS | Status: AC

## 2022-09-07 MED ORDER — DROPERIDOL 2.5 MG/ML IJ SOLN
2.5000 mg | Freq: Once | INTRAMUSCULAR | Status: AC
  Administered 2022-09-07: 2.5 mg via INTRAVENOUS

## 2022-09-07 MED ORDER — ONDANSETRON HCL 4 MG/2ML IJ SOLN: 4.0000 mg | Freq: Once | INTRAMUSCULAR | Status: AC

## 2022-09-07 MED ORDER — INSULIN ASPART 100 UNIT/ML IJ SOLN: INTRAMUSCULAR | 0 refills | Status: DC

## 2022-09-07 MED ORDER — DEXTROSE 50 % IV SOLN
25.0000 g | INTRAVENOUS | Status: AC
  Administered 2022-09-07: 25 g via INTRAVENOUS

## 2022-09-07 MED ORDER — POTASSIUM CHLORIDE 20 MEQ PO PACK: 80.0000 meq | PACK | Freq: Every day | ORAL | Status: DC

## 2022-09-07 MED ORDER — HEPARIN SOD (PORK) LOCK FLUSH 100 UNIT/ML IV SOLN
INTRAVENOUS | Status: AC
  Filled 2022-09-07: qty 5

## 2022-09-07 MED ORDER — POTASSIUM CHLORIDE 10 MEQ/100ML IV SOLN: 10.0000 meq | INTRAVENOUS | Status: DC

## 2022-09-07 MED ORDER — ORAL CARE MOUTH RINSE: 15.0000 mL | OROMUCOSAL | Status: DC | PRN

## 2022-09-07 MED ORDER — ONDANSETRON HCL 4 MG/2ML IJ SOLN
INTRAMUSCULAR | Status: AC
  Administered 2022-09-07: 4 mg via INTRAVENOUS
  Filled 2022-09-07: qty 2

## 2022-09-07 NOTE — ED Provider Notes (Addendum)
[facility] Provider Note    Event Date/Time   First MD Initiated Contact with Patient 09/07/22 1736     (approximate)   History   Hypoglycemia   HPI  Ashley Freeman is a 32 y.o. 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  Reportedly with seizure-like activity as well.  Given glucagon by EMS for difficulty with IV access and brought immediately to the emergency department.  We removed her insulin pump upon her arrival.  IV access was obtained and recheck of blood glucose refractory hypoglycemia, given dextrose D50 multiple times after which she responds transiently and gets hypoglycemic again.  After getting dextrose she is minimally responsive answering questions but then quickly falling back asleep.  VBG is normal.  She has dried emesis on her shirt. Is unable to provide much of a history given her altered mental status and somnolence. Independent Historian contributed to assessment above: ems  External Medical Documents Reviewed: Discharge summary from July 2023 when she was admitted for hypoglycemia and severe DKA      Physical Exam   Triage Vital Signs: ED Triage Vitals [09/07/22 1754]  Enc Vitals Group     BP      Pulse      Resp      Temp (!) 97.5 F (36.4 C)     Temp Source Oral     SpO2      Weight      Height      Head Circumference      Peak Flow      Pain Score      Pain Loc      Pain Edu?      Excl. in GC?     Most recent vital signs: Vitals:   09/07/22 2300 09/07/22 2325  BP: (!) 162/106   Pulse: 95 95  Resp: 20 11  Temp: (!) 97.5 F (36.4 C)   SpO2: 98% 97%    General: Minimally responsive, somnolent, dried emesis on shirt CV:  Good peripheral perfusion.  Resp:  Normal effort.    ED Results /  Procedures / Treatments   Labs (all labs ordered are listed, but only abnormal results are displayed) Labs Reviewed  CBC WITH DIFFERENTIAL/PLATELET - Abnormal; Notable for the following components:      Result Value   MCH 25.7 (*)    MCHC 29.9 (*)    RDW 17.3 (*)    Eosinophils Absolute 0.9 (*)    All other components within normal limits  COMPREHENSIVE METABOLIC PANEL - Abnormal; Notable for the following components:   Potassium 2.6 (*)    BUN 36 (*)    Creatinine, Ser 6.17 (*)    Calcium 8.4 (*)    Total Protein 6.4 (*)    Albumin 3.3 (*)    Alkaline Phosphatase 233 (*)    GFR, Estimated 9 (*)    All other components within normal limits  ACETAMINOPHEN LEVEL - Abnormal; Notable for the following components:   Acetaminophen (Tylenol), Serum <10 (*)    All other components within normal limits  SALICYLATE LEVEL - Abnormal; Notable for the following components:   Salicylate Lvl <7.0 (*)    All other components within normal limits  URINALYSIS, W/ REFLEX TO CULTURE (INFECTION SUSPECTED) - Abnormal; Notable for the  following components:   Color, Urine YELLOW (*)    APPearance CLEAR (*)    Glucose, UA 150 (*)    Protein, ur >=300 (*)    Bacteria, UA RARE (*)    All other components within normal limits  BLOOD GAS, VENOUS - Abnormal; Notable for the following components:   pO2, Ven 60 (*)    Bicarbonate 28.9 (*)    Acid-Base Excess 2.7 (*)    All other components within normal limits  MAGNESIUM - Abnormal; Notable for the following components:   Magnesium 2.8 (*)    All other components within normal limits  BRAIN NATRIURETIC PEPTIDE - Abnormal; Notable for the following components:   B Natriuretic Peptide 2,669.4 (*)    All other components within normal limits  GLUCOSE, CAPILLARY - Abnormal; Notable for the following components:   Glucose-Capillary <10 (*)    All other components within normal limits  GLUCOSE, CAPILLARY - Abnormal; Notable for the following components:    Glucose-Capillary 138 (*)    All other components within normal limits  GLUCOSE, CAPILLARY - Abnormal; Notable for the following components:   Glucose-Capillary 47 (*)    All other components within normal limits  GLUCOSE, CAPILLARY - Abnormal; Notable for the following components:   Glucose-Capillary 170 (*)    All other components within normal limits  CBG MONITORING, ED - Abnormal; Notable for the following components:   Glucose-Capillary 66 (*)    All other components within normal limits  CBG MONITORING, ED - Abnormal; Notable for the following components:   Glucose-Capillary 31 (*)    All other components within normal limits  CBG MONITORING, ED - Abnormal; Notable for the following components:   Glucose-Capillary 16 (*)    All other components within normal limits  CBG MONITORING, ED - Abnormal; Notable for the following components:   Glucose-Capillary 122 (*)    All other components within normal limits  MRSA NEXT GEN BY PCR, NASAL  CULTURE, BLOOD (ROUTINE X 2)  CULTURE, BLOOD (ROUTINE X 2)  ETHANOL  LACTIC ACID, PLASMA  LACTIC ACID, PLASMA  URINE DRUG SCREEN, QUALITATIVE ([facility] ONLY)  HCG, QUANTITATIVE, PREGNANCY  LIPASE, BLOOD  HIV ANTIBODY (ROUTINE TESTING W REFLEX)  PROCALCITONIN  PHOSPHORUS  CBC  BASIC METABOLIC PANEL  MAGNESIUM  CBG MONITORING, ED  POC URINE PREG, ED  CBG MONITORING, ED  CBG MONITORING, ED     I ordered and reviewed the above labs they are notable for hypoglycemia and hypokalemia  EKG  ED ECG REPORT I, Pilar Jarvis, the attending physician, personally viewed and interpreted this ECG.   Date: 09/07/2022  EKG Time: 1810  Rate: 82  Rhythm: sinus  Axis: nl  Intervals:long QTC  ST&T Change: no stemi    RADIOLOGY I independently reviewed and interpreted CT scan of the head see no obvious bleeding or midline shift   PROCEDURES:  Critical Care performed: Yes, see critical care procedure note(s)  .Critical Care  Performed by:  Pilar Jarvis, MD Authorized by: Pilar Jarvis, MD   Critical care provider statement:    Critical care time (minutes):  30   Critical care was time spent personally by me on the following activities:  Development of treatment plan with patient or surrogate, discussions with consultants, evaluation of patient's response to treatment, examination of patient, ordering and review of laboratory studies, ordering and review of radiographic studies, ordering and performing treatments and interventions, pulse oximetry, re-evaluation of patient's condition and review of old charts  MEDICATIONS ORDERED IN ED: Medications  heparin lock flush 100 UNIT/ML injection (  Not Given 09/07/22 2022)  docusate sodium (COLACE) capsule 100 mg (has no administration in time range)  polyethylene glycol (MIRALAX / GLYCOLAX) packet 17 g (has no administration in time range)  potassium chloride 10 mEq in 100 mL IVPB (10 mEq Intravenous New Bag/Given 09/07/22 2245)  dextrose 10 % infusion ( Intravenous Restarted 09/07/22 2251)  Oral care mouth rinse (15 mLs Mouth Rinse Given 09/07/22 2215)  Oral care mouth rinse (has no administration in time range)  0.9 %  sodium chloride infusion (75 mL/hr Intravenous New Bag/Given 09/07/22 2252)  LORazepam (ATIVAN) injection 4 mg (has no administration in time range)  levETIRAcetam (KEPPRA) IVPB 500 mg/100 mL premix (has no administration in time range)  Chlorhexidine Gluconate Cloth 2 % PADS 6 each (6 each Topical Given 09/07/22 2313)  ondansetron (ZOFRAN) injection 4 mg (4 mg Intravenous Given 09/07/22 1757)  droperidol (INAPSINE) 2.5 MG/ML injection 2.5 mg (2.5 mg Intravenous Given 09/07/22 1820)  dextrose 50 % solution 25 g ( Intravenous Canceled Entry 09/07/22 1945)  potassium chloride 10 mEq in 100 mL IVPB (0 mEq Intravenous Stopped 09/07/22 2145)  dextrose 50 % solution 25 g (50 mLs Intravenous Given 09/07/22 2240)  LORazepam (ATIVAN) 2 MG/ML injection (2 mg  Given 09/07/22 2233)   levETIRAcetam (KEPPRA) IVPB 1000 mg/100 mL premix (0 mg Intravenous Stopped 09/07/22 2312)  dextrose 50 % solution 25 g ( Intravenous Canceled Entry 09/07/22 2332)    External physician / consultants:  I spoke with ICU consultant regarding care plan for this patient.   IMPRESSION / MDM / ASSESSMENT AND PLAN / ED COURSE  I reviewed the triage vital signs and the nursing notes.                                Patient's presentation is most consistent with acute presentation with potential threat to life or bodily function.  Differential diagnosis includes, but is not limited to, hypoglycemia due to insulin overdose, metabolic derangements, dehydration, infection, intracranial bleeding   The patient is on the cardiac monitor to evaluate for evidence of arrhythmia and/or significant heart rate changes.  MDM:    Critically ill patient with refractory hypoglycemia and hypokalemia minimally responsive but protecting her airway.  Given multiple doses of dextrose but becomes quickly hypoglycemic afterwards again.  Ordered for IV repletion of hypokalemia with prolonged QTc interval.  She has a history of end-stage renal disease on dialysis and evidence of some pulmonary edema on her chest x-ray so I hesitate to provide aggressive IV volume for fear of fluid overload and pulmonary edema.  I consulted with ICU for admission.         FINAL CLINICAL IMPRESSION(S) / ED DIAGNOSES   Final diagnoses:  Hypokalemia  Hypoglycemia  Altered mental status, unspecified altered mental status type     Rx / DC Orders   ED Discharge Orders     None        Note:  This document was prepared using Dragon voice recognition software and may include unintentional dictation errors.    Pilar Jarvis, MD 09/08/22 0000    Pilar Jarvis, MD 09/08/22 0001

## 2022-09-07 NOTE — ED Triage Notes (Signed)
Pt to ED via EMS from home unresponsive. Pt found to have CBG 48 and was given 25 g D10, 1 mg Glucagon IM. Pt has a hx of diabetes type I. EMS reports pt had seizure-like activity at home.

## 2022-09-07 NOTE — Plan of Care (Signed)
Discussed with patient plan of care for the evening, pain management and some admission requirements with some teach back displayed.  Patient arrived to floor drowsy but able to follow simple commands.  She soon started snoring and having a seizure lasting about 5 minutes going post ictal (Interventions: Ativan and D5W per hypoglycemic protocol since CBG - Lo).    Patient is arousable to voice and pain and with continue to monitor CBG's every hour.  Mom called and dicussed possibility of having to intubate to keep her airway open.  Problem: Education: Goal: Knowledge of General Education information will improve Description: Including pain rating scale, medication(s)/side effects and non-pharmacologic comfort measures Outcome: Not Progressing   Problem: Health Behavior/Discharge Planning: Goal: Ability to manage health-related needs will improve Outcome: Not Progressing

## 2022-09-07 NOTE — ED Notes (Signed)
Blood sugar re-check was 122

## 2022-09-07 NOTE — Progress Notes (Signed)
Hypoglycemic Event  CBG: 47 @ 2325  Treatment: D50 50 mL (25 gm)  Symptoms:  Hypoglycemia  Follow-up CBG: Time: 2350 CBG Result:170  Possible Reasons for Event: Medication regimen: Admitted to floor with Hypoglycemia  Comments/MD notified: Reyes Ivan NP    Peter Minium

## 2022-09-07 NOTE — Progress Notes (Signed)
eLink Physician-Brief Progress Note Patient Name: Ashley Freeman DOB: 10/03/1990 MRN: [id number]   Date of Service  09/07/2022  HPI/Events of Note  Patient admitted with profound hypoglycemia, altered mental status and suspected seizure.  eICU Interventions  New Patient Evaluation.        Migdalia Dk 09/07/2022, 10:54 PM

## 2022-09-07 NOTE — Telephone Encounter (Signed)
done

## 2022-09-07 NOTE — ED Notes (Signed)
Continues to vomit, EDP notified, order received.

## 2022-09-07 NOTE — ED Notes (Signed)
Verbal order for Amp of D5 Administered

## 2022-09-07 NOTE — ED Notes (Signed)
Father at [facility]. Pt sedated, vomiting abated after droperidol, arousable to voice, NAD, calm, intermittently interactive. Skin cool and dry. VSS. Blood sent. CT delayed.

## 2022-09-07 NOTE — ED Notes (Signed)
2nd VBG sent to lab, RT notified.

## 2022-09-07 NOTE — ED Notes (Signed)
Amp of D5 Given

## 2022-09-07 NOTE — Progress Notes (Signed)
Hypoglycemic Event  CBG: <10 @ 2205  Treatment: D50 50 mL (25 gm)  Symptoms:  Seizure  Follow-up CBG: Time:2217 CBG Result:138  Possible Reasons for Event: Other: Medication regimen: Admitted to floor with Hypoglycemia  Comments/MD notified:E. Anna Genre NP    [name] Dionne

## 2022-09-07 NOTE — Telephone Encounter (Signed)
MEDICATION: 1)  insulin aspart insulin aspart (NOVOLOG) 100 UNIT/ML injection (PATIENT WANTS VIALS INSTEAD OF PENS)  2) Dexcom G7 Sensor Continuous Glucose Sensor (DEXCOM G7 SENSOR) MISC  PHARMACY:    [id number] - [place] [address] [place] - [address] (Ph: [phone])    HAS THE PATIENT CONTACTED THEIR PHARMACY?  Yes  IS THIS A 90 DAY SUPPLY : Yes  IS PATIENT OUT OF MEDICATION: Insulin-yes, sensor-using last one now  IF NOT; HOW MUCH IS LEFT:   LAST APPOINTMENT DATE: @2 /23/24  NEXT APPOINTMENT DATE:@8 /16/2024  DO WE HAVE YOUR PERMISSION TO LEAVE A DETAILED MESSAGE?: Yes  OTHER COMMENTS:    **Let patient know to contact pharmacy at the end of the day to make sure medication is ready. **  ** Please notify patient to allow 48-72 hours to process**  **Encourage patient to contact the pharmacy for refills or they can request refills through [facility]**

## 2022-09-07 NOTE — H&P (Incomplete)
NAME:  Ashley Freeman, MRN:  [id number], DOB:  Jun 04, 1990, LOS: 1 ADMISSION DATE:  09/07/2022, CONSULTATION DATE:  09/07/22 REFERRING MD:  Pilar Jarvis CHIEF COMPLAINT:  Unresponsiveness    HPI  32 y.o  female with significant PMH of T1DM on Insulin pump, HFrEF, ESRD Dialysis T/T/Sat  S/p right upper extremity AV graft 07/06/22 Currently undergoing dialysis through right IJ, HTN, and HLD  who presented to the ED with chief complaints of unresponsiveness.   Per ED reports, EMS was called for seizure like activity and unresponsiveness. On EMS arrival, patient had a blood glucose of 48, for which she received dextrose and glucagon with improvement of blood glucose but no change in mentation. On review of her chart, patient was recently admitted at [facility] with DKA. Per [facility] notes, patient stated she was using subq insulin instead of her omnipod. She was advised to use her insulin pump by her endocrinologist but from chart messages it appears she was having difficulties using her insulin pump.   ED Course: Initial vital signs showed HR of beats/minute, BP mm Hg, the RR 30 breaths/minute, and the oxygen saturation % on and a temperature of 98.62F (36.9C). Patient was lethargic, moaned intermittently, and responded with one-word answers; symmetric movement in the arms and legs was observed. Patient was actively vomiting and was treated with Droperidol. Due to high risk for decompensation, PCCM consulted for admission.  Pertinent Labs/Diagnostics Findings: Na+/ K+: 140/2.6 Glucose: 77 BUN/Cr.:36/6.17 WBC:7.5 BNP: 2669.4 VBG:pO2 60; pCO2 50; pH 7.37;  HCO3 28.9, %O2 Sat 89.8.   On arrival to the unit, blood glucose was obtained and showed unreadable on the glucometer. Patient developed tonic-clonic seizures and decorticate posturing prior to receiving dextrose. She received Ativan and was  about to be intubated for airway protection when she woke up and was able to follow commands. Initial CT head  without contrast was negative.  Past Medical History  T1DM on Insulin pump, HFrEF, ESRD Dialysis T/T/Sat  S/p right upper extremity AV graft 07/06/22 Currently undergoing dialysis through right IJ, HTN, and HLD  Significant Hospital Events   5/10:Admit to ICU with toxic metabolic encephalopathy int he setting of severe hypoglycemia and Hypoglycemia Induced Seizures.  Consults:  Neurology  Procedures:  None  Significant Diagnostic Tests:  5/10: Chest Xray: IMPRESSION: Cardiomegaly with mild vascular congestion and suspected mild edema. Minimal atelectasis, pneumonia or aspiration at the left base. Trace pleural effusions.  5/10: Noncontrast CT head: IMPRESSION: Normal head CT.  5/10: CTA abdomen and pelvis: IMPRESSION: 1. No CT evidence for acute intra-abdominal or pelvic abnormality. 2. Heterogeneous airspace disease in the left greater than right lower lobes, suspect for pneumonia. Cardiomegaly.   Interim History / Subjective:  -BG  unreadable. Had another episode of witnessed seizures  Micro Data:  5/10: Blood culture x2> 5/10: Urine Culture> 5/10: MRSA PCR>>   Antimicrobials:  Unasyn  OBJECTIVE  Blood pressure (!) 162/106, pulse 95, temperature (!) 97.5 F (36.4 C), temperature source Oral, resp. rate 11, height 5\' 3"  (1.6 m), weight 59.3 kg, SpO2 97 %, unknown if currently breastfeeding.       No intake or output data in the 24 hours ending 09/08/22 0003 Filed Weights   09/07/22 2241  Weight: 59.3 kg    Physical Examination  GENERAL:  year-old critically ill patient lying in the bed minimally unresponsive EYES: PEERLA. No scleral icterus. Extraocular muscles intact.  HEENT: Head atraumatic, normocephalic. Oropharynx and nasopharynx clear.  NECK:  No JVD, supple  LUNGS: Normal breath sounds bilaterally.  No use of accessory muscles of respiration.  CARDIOVASCULAR: S1, S2 normal. No murmurs, rubs, or gallops.  ABDOMEN: Soft, NTND EXTREMITIES: Trace pitting  edema of bilateral lower extremities.Capillary refill >3 seconds in all extremities. Pulses palpable distally. NEUROLOGIC: The patient is altered. Moves all extremities against gravity. Cranial nerves are intact.  SKIN: No obvious rash, lesion, or ulcer. Warm to touch Labs/imaging that I havepersonally reviewed  (right click and "Reselect all SmartList Selections" daily)     Labs   CBC: Recent Labs  Lab 09/07/22 1802  WBC 7.4  NEUTROABS 3.9  HGB 12.9  HCT 43.1  MCV 85.9  PLT 175    Basic Metabolic Panel: Recent Labs  Lab 09/07/22 1802 09/07/22 1909  NA 140  --   K 2.6*  --   CL 103  --   CO2 25  --   GLUCOSE 77  --   BUN 36*  --   CREATININE 6.17*  --   CALCIUM 8.4*  --   MG  --  2.8*   GFR: Estimated Creatinine Clearance: 11 mL/min (A) (by C-G formula based on SCr of 6.17 mg/dL (H)). Recent Labs  Lab 09/07/22 1802 09/07/22 1840 09/07/22 2236  WBC 7.4  --   --   LATICACIDVEN  --  1.0 1.6    Liver Function Tests: Recent Labs  Lab 09/07/22 1802  AST 33  ALT 43  ALKPHOS 233*  BILITOT 0.8  PROT 6.4*  ALBUMIN 3.3*   Recent Labs  Lab 09/07/22 2237  LIPASE 21   No results for input(s): "AMMONIA" in the last 168 hours.  ABG    Component Value Date/Time   PHART 7.375 04/24/2021 0642   PCO2ART 38.1 04/24/2021 0642   PO2ART 98.7 04/24/2021 0642   HCO3 28.9 (H) 09/07/2022 1823   TCO2 28 07/06/2022 0834   ACIDBASEDEF 25.6 (H) 11/06/2021 0933   O2SAT 89.8 09/07/2022 1823     Coagulation Profile: No results for input(s): "INR", "PROTIME" in the last 168 hours.  Cardiac Enzymes: No results for input(s): "CKTOTAL", "CKMB", "CKMBINDEX", "TROPONINI" in the last 168 hours.  HbA1C: Hemoglobin A1C  Date/Time Value Ref Range Status  03/15/2022 11:43 AM 8.5 (A) 4.0 - 5.6 % Final  11/11/2013 04:31 AM 14.6 (H) 4.2 - 6.3 % Final    Comment:    The American Diabetes Association recommends that a primary goal of therapy should be <7% and that physicians  should reevaluate the treatment regimen in patients with HbA1c values consistently >8%.   09/12/2011 02:53 AM SEE COMMENT 4.2 - 6.3 % Final    Comment:    The American Diabetes Association recommends that a primary goal of therapy should be <7% and that physicians should reevaluate the treatment regimen in patients with HbA1c values consistently >8%. HGB A1C - Unable to perform testing at [facility] due  - to interfering substance. HGB A1C - H -- 16.8 %  - Reference Range: 4.8-5.6  - ----------------------------------------  - Increased risk for diabetes: 5.7-6.4  - Diabetes: >6.4  - Glycemic control for adults with  - .[name]... diabetes: <7.0  - ----------------------------------------  - PERFORMED BY Sammuel Hines SPEC.NO.: [id number]  - [address] [address]  - Mila Homer, MD  - [phone]    HbA1c, POC (controlled diabetic range)  Date/Time Value Ref Range Status  06/04/2018 09:29 AM   Final    Comment:    >15   Hgb A1c MFr Bld  Date/Time Value Ref Range Status  11/09/2021 06:19 AM 9.1 (H) 4.8 - 5.6 % Final    Comment:    (NOTE) Pre diabetes:          5.7%-6.4%  Diabetes:              >6.4%  Glycemic control for   <7.0% adults with diabetes   07/26/2021 04:45 PM 8.0 (H) 4.8 - 5.6 % Final    Comment:    (NOTE) Pre diabetes:          5.7%-6.4%  Diabetes:              >6.4%  Glycemic control for   <7.0% adults with diabetes     CBG: Recent Labs  Lab 09/07/22 2050 09/07/22 2205 09/07/22 2217 09/07/22 2325 09/07/22 2350  GLUCAP 122* <10* 138* 47* 170*    Review of Systems:   Unable to be obtained secondary to the patient's altered mental status   Past Medical History  She,  has a past medical history of Abscess, gluteal, right (08/24/2013), AKI (acute kidney injury) (HCC) (07/26/2014), Anemia (02/19/2012), Bartholin's gland abscess (09/19/2013), Bipolar disorder (HCC), BV (bacterial vaginosis) (11/24/2015), Depression,  Diabetes mellitus type I (HCC) (2001), Diarrhea (05/30/2016), DKA (diabetic ketoacidoses) (08/19/2013), Gonorrhea (08/2011), History of trichomoniasis (05/31/2016), Hyperlipidemia (03/28/2016), Hypertension, and Sepsis (HCC) (09/19/2013).   Surgical History    Past Surgical History:  Procedure Laterality Date   AV FISTULA PLACEMENT Right 07/06/2022   Procedure: ARTERIOVENOUS GRAFT CREATION;  Surgeon: Cephus Shelling, MD;  Location: [facility] OR;  Service: Vascular;  Laterality: Right;   CESAREAN SECTION N/A 10/05/2019   Procedure: CESAREAN SECTION;  Surgeon: Kimmell Bing, MD;  Location: [facility] LD ORS;  Service: Obstetrics;  Laterality: N/A;   INCISION AND DRAINAGE ABSCESS Left 09/28/2019   Procedure: INCISION AND DRAINAGE VULVAR ABCESS;  Surgeon: Tilda Burrow, MD;  Location: [facility] OR;  Service: Gynecology;  Laterality: Left;   INCISION AND DRAINAGE PERIRECTAL ABSCESS Right 08/18/2013   Procedure: IRRIGATION AND DEBRIDEMENT GLUTEAL ABSCESS;  Surgeon: Axel Filler, MD;  Location: [facility] OR;  Service: General;  Laterality: Right;   INCISION AND DRAINAGE PERIRECTAL ABSCESS Right 09/19/2013   Procedure: IRRIGATION AND DEBRIDEMENT RIGHT GLUTEAL AND LABIAL ABSCESSES;  Surgeon: Axel Filler, MD;  Location: [facility] OR;  Service: General;  Laterality: Right;   INCISION AND DRAINAGE PERIRECTAL ABSCESS Right 09/24/2013   Procedure: IRRIGATION AND DEBRIDEMENT PERIRECTAL ABSCESS;  Surgeon: Cherylynn Ridges, MD;  Location: [facility] OR;  Service: General;  Laterality: Right;     Social History   reports that she has never smoked. She has never been exposed to tobacco smoke. She has never used smokeless tobacco. She reports that she does not currently use alcohol. She reports that she does not use drugs.   Family History   Her family history includes Asthma in her mother; Carpal tunnel syndrome in her mother; Diabetes in her paternal grandmother; Gout in her father. There is no history of Anesthesia problems.    Allergies Allergies  Allergen Reactions   Cephalexin Anaphylaxis    Has gotten ceftriaxone in the past    Penicillins Hives and Rash    Has patient had a PCN reaction causing immediate rash, facial/tongue/throat swelling, SOB or lightheadedness with hypotension: Yes Has patient had a PCN reaction causing severe rash involving mucus membranes or skin necrosis: No Has patient had a PCN reaction that required hospitalization: Yes Has patient had a PCN reaction occurring within the last 10 years: No Spoke  with pt - childhood hives told by mom, tried no pcns since, doesn't remember reaction herself    Benadryl [Diphenhydramine] Itching   Doxycycline Itching     Home Medications  Prior to Admission medications   Medication Sig Start Date End Date Taking? Authorizing Provider  acetaminophen (TYLENOL) 500 MG tablet Take 1,000 mg by mouth every 6 (six) hours as needed for headache (pain).    [provider]  albuterol (VENTOLIN HFA) 108 (90 Base) MCG/ACT inhaler Inhale 2 puffs into the lungs every 4 (four) hours as needed for wheezing or shortness of breath. 08/09/20   [provider]  amLODipine (NORVASC) 10 MG tablet Take 1 tablet (10 mg total) by mouth daily. 11/11/21 07/03/22  Lorin Glass, MD  blood glucose meter kit and supplies KIT Dispense based on patient and insurance preference. Use up to four times daily as directed. (FOR ICD-9 250.00, 250.01). 05/25/17   Jacalyn Lefevre, MD  Blood Pressure Monitoring (BLOOD PRESSURE KIT) DEVI 1 Device by Does not apply route as needed. 05/06/19   Anyanwu, Jethro Bastos, MD  carvedilol (COREG) 25 MG tablet Take 1 tablet (25 mg total) by mouth 2 (two) times daily. 11/11/21 07/03/22  Lorin Glass, MD  Continuous Blood Gluc Sensor (DEXCOM G6 SENSOR) MISC Change sensors every 10 days 07/02/22   Shamleffer, Konrad Dolores, MD  Continuous Blood Gluc Transmit (DEXCOM G6 TRANSMITTER) MISC Inject 1 Device into the skin as directed. Use to check blood sugar  daily 07/02/22   Shamleffer, Konrad Dolores, MD  Continuous Glucose Sensor (DEXCOM G7 SENSOR) MISC Change sensors every 10 days 09/07/22   Shamleffer, Konrad Dolores, MD  ferrous sulfate 324 (65 Fe) MG TBEC Take 324 mg by mouth daily. 04/11/22   [provider]  furosemide (LASIX) 20 MG tablet Take 20 mg by mouth daily.    [provider]  insulin aspart (NOVOLOG) 100 UNIT/ML injection Max daily 50 units .Uses insulin pump 09/07/22   Shamleffer, Konrad Dolores, MD  Insulin Disposable Pump (OMNIPOD 5 G6 INTRO, GEN 5,) KIT Change pods every 3 days 01/22/22   Shamleffer, Konrad Dolores, MD  Insulin Disposable Pump (OMNIPOD 5 G6 POD, GEN 5,) MISC CHANGE EVERY 3 DAYS 04/13/22   Shamleffer, Konrad Dolores, MD  Insulin Pen Needle 32G X 4 MM MISC Use as directed 10/07/20   Ghimire, Werner Lean, MD  lamoTRIgine (LAMICTAL) 25 MG tablet Take 25 mg by mouth daily.    [provider]  LORazepam (ATIVAN) 0.5 MG tablet Take by mouth. 07/13/22   [provider]  lurasidone (LATUDA) 40 MG TABS tablet Take 40 mg by mouth daily with breakfast.    [provider]  oxyCODONE-acetaminophen (PERCOCET/ROXICET) 5-325 MG tablet Take 1 tablet by mouth every 4 (four) hours as needed for severe pain. 07/10/22   Rhyne, Ames Coupe, PA-C  rosuvastatin (CRESTOR) 40 MG tablet Take 40 mg by mouth daily. 05/22/22   [provider]  sevelamer carbonate (RENVELA) 800 MG tablet Take 1,600 mg by mouth 3 (three) times daily with meals. 05/22/22 05/22/23  [provider]  SUMAtriptan (IMITREX) 50 MG tablet Take 50 mg by mouth every 2 (two) hours as needed for migraine or headache. 06/20/22   [provider]  torsemide (DEMADEX) 20 MG tablet Take 20 mg by mouth daily. 05/25/22   [provider]  Vitamin D, Ergocalciferol, (DRISDOL) 1.25 MG (50000 UNIT) CAPS capsule Take 50,000 Units by mouth every 7 (seven) days. 08/17/21   [provider]  Scheduled  Meds:   Chlorhexidine Gluconate Cloth  6 each Topical Daily   heparin lock flush       mouth rinse  15 mL Mouth Rinse Q2H   Continuous Infusions:  sodium chloride 75 mL/hr (09/07/22 2252)   ceFEPime (MAXIPIME) IV     dextrose 50 mL/hr at 09/07/22 2251   levETIRAcetam     metronidazole     potassium chloride 10 mEq (09/07/22 2245)   PRN Meds:.docusate sodium, heparin lock flush, LORazepam, mouth rinse, polyethylene glycol  Active Hospital Problem list     Assessment & Plan:   #Acute toxic metabolic encephalopathy in the setting of Severe Hypoglycemia and Hypoglycemic Induced Seizures -CTH negative for acute intracranial abnormality -Obtain EEG for prognostication once stable -We will give Keppra load 1g and continue maintenance 500 mg twice daily -keep sedation light as able though may be limited in setting of his myoclonus/possible sz activity -Seizure precautions -Neuro consult   #Severe Hypoglycemia #Type I DM with hx of recent DKA present with  altered mental status due to severe hypoglycemia and seizures Presentation likely due to non compliance with insulin regimen. She was placed on Insulin pump with basal rate @midnight  -11 am 0.5 unit/hr and 11 am-midnight  0.6 units/hour  -Hypoglycemia order set in place  -Glucose: q1h  -Lab monitoring: q2-4h BMP+Phosphorus+pH (ABG/VBG) to assess severity of acidemia -Start D10 infusion cautiously x 4 hours for now  -Diabetes coordinator consult  #Acute Hypoxic Respiratory Failure #Possible aspiration Event due to multiple episodes of vomiting with decreased LOC #Volume Overload -Supplemental O2 as needed to maintain O2 sats >92% -pulmonary hygiene -Follow chest x-ray, ABG prn.  -BCx, abx for 5d course    #ESRD #Hypokalemia Dialysis T/T/Sat. S/p right upper extremity AV graft 07/06/22 at [facility] not ready for use until 5/3. Currently undergoing dialysis through right IJ. -Replete K with 50% dose -BMP, mg, phos daily  -Replace  electrolytes as indicated -Nephro consult, plan to dialyze tomorrow as scheduled  #Acute on Chronic HFrEF:  #HTN #HLD Echo( 2022) LVEF 40%, recovered (2023) however, most recent Echo on 09/05/22 showed severely decreased, LVEF estimated at 35%.  BNP: possibly secondary to heart failure versus worsening of her ESRD? -Continue losartan and carvedilol, no spironolactone or SGLT2 due to advanced CKD -rosuvastatin 40 mg daily.  -Continue Torsemide to 80 mg twice daily on no dialysis days -Continue dialysis Tuesday/Thursday/Saturday   Best practice:  Diet:  Oral Pain/Anxiety/Delirium protocol (if indicated): No VAP protocol (if indicated): Not indicated DVT prophylaxis: Subcutaneous Heparin GI prophylaxis: N/A Glucose control:  SSI No Central venous access:  Yes, and it is still needed Arterial line:  N/A Foley:  N/A Mobility:  bed rest  PT consulted: N/A Last date of multidisciplinary goals of care discussion [09/07/22] Code Status:  full code Disposition: ICU   = Goals of Care = Code Status Order:FULL  Primary Emergency Contact: [place],Latisha Wishes to pursue full aggressive treatment and intervention options, including CPR and intubation, but goals of care will be addressed on going with family if that should become necessary.  Critical care time: 45 minutes        Webb Silversmith DNP, CCRN, FNP-C, AGACNP-BC Acute Care & Family Nurse Practitioner  Pulmonary & Critical Care Medicine PCCM on call pager [phone]

## 2022-09-07 NOTE — Consult Note (Addendum)
PHARMACY CONSULT NOTE - FOLLOW UP  Pharmacy Consult for Electrolyte Monitoring and Replacement   Recent Labs: Potassium (mmol/L)  Date Value  09/07/2022 2.6 (LL)  11/12/2013 4.2   Magnesium (mg/dL)  Date Value  16/01/9603 2.8 (H)  11/11/2013 1.5 (L)   Calcium (mg/dL)  Date Value  54/12/8117 8.4 (L)   Calcium, Total (mg/dL)  Date Value  14/78/2956 8.2 (L)   Albumin (g/dL)  Date Value  21/30/8657 3.3 (L)  09/08/2019 2.9 (L)  11/10/2013 3.9   Phosphorus (mg/dL)  Date Value  84/69/6295 5.3 (H)   Sodium (mmol/L)  Date Value  09/07/2022 140  07/13/2021 136  11/12/2013 137     Assessment: 32 y.o  female with significant PMH of ,T1DM on Insulin pump, ESRD Dialysis T/T/Sat  presented to ED from home unresponsive. Patient found to be hypoglycemia with CBG 48 with seizure-like activity PTA. Pharmacy has been consulted to monitor and replace electrolytes as needed while patient admitted to ICU.   Goal of Therapy:  Electrolytes WNL  Plan:  Hypokalemia: Will order 40 mEQ PO KCL x 1 + #4 10 mEq IV KCL Recheck K+ following last IV dose Recheck all other electrolytes with AM labs   Addendum: Patient NPO, will replace with # 6 10 mEq IV KCL  Sharen Hones, PharmD, BCPS Clinical Pharmacist   09/07/2022 8:09 PM

## 2022-09-07 NOTE — ED Notes (Addendum)
Trending CBG 93,73, 66. EDP notified. Order received. IV Dextrose given. VSS. Airway patent.

## 2022-09-08 ENCOUNTER — Other Ambulatory Visit: Payer: Self-pay

## 2022-09-08 ENCOUNTER — Encounter: Payer: Self-pay | Admitting: Student in an Organized Health Care Education/Training Program

## 2022-09-08 DIAGNOSIS — E876 Hypokalemia: Secondary | ICD-10-CM

## 2022-09-08 DIAGNOSIS — E162 Hypoglycemia, unspecified: Secondary | ICD-10-CM

## 2022-09-08 DIAGNOSIS — J9601 Acute respiratory failure with hypoxia: Secondary | ICD-10-CM

## 2022-09-08 DIAGNOSIS — I5020 Unspecified systolic (congestive) heart failure: Secondary | ICD-10-CM

## 2022-09-08 DIAGNOSIS — R404 Transient alteration of awareness: Secondary | ICD-10-CM

## 2022-09-08 DIAGNOSIS — N186 End stage renal disease: Secondary | ICD-10-CM

## 2022-09-08 DIAGNOSIS — Z992 Dependence on renal dialysis: Secondary | ICD-10-CM

## 2022-09-08 DIAGNOSIS — I420 Dilated cardiomyopathy: Secondary | ICD-10-CM

## 2022-09-08 DIAGNOSIS — R569 Unspecified convulsions: Secondary | ICD-10-CM

## 2022-09-08 DIAGNOSIS — E11649 Type 2 diabetes mellitus with hypoglycemia without coma: Secondary | ICD-10-CM

## 2022-09-08 DIAGNOSIS — I429 Cardiomyopathy, unspecified: Secondary | ICD-10-CM

## 2022-09-08 DIAGNOSIS — E10649 Type 1 diabetes mellitus with hypoglycemia without coma: Principal | ICD-10-CM

## 2022-09-08 LAB — GLUCOSE, CAPILLARY
Glucose-Capillary: 156 mg/dL — ABNORMAL HIGH (ref 70–99)
Glucose-Capillary: 232 mg/dL — ABNORMAL HIGH (ref 70–99)
Glucose-Capillary: 130 mg/dL — ABNORMAL HIGH (ref 70–99)
Glucose-Capillary: 252 mg/dL — ABNORMAL HIGH (ref 70–99)
Glucose-Capillary: 279 mg/dL — ABNORMAL HIGH (ref 70–99)
Glucose-Capillary: 318 mg/dL — ABNORMAL HIGH (ref 70–99)
Glucose-Capillary: 337 mg/dL — ABNORMAL HIGH (ref 70–99)
Glucose-Capillary: 363 mg/dL — ABNORMAL HIGH (ref 70–99)
Glucose-Capillary: 408 mg/dL — ABNORMAL HIGH (ref 70–99)
Glucose-Capillary: 478 mg/dL — ABNORMAL HIGH (ref 70–99)
Glucose-Capillary: 286 mg/dL — ABNORMAL HIGH (ref 70–99)
Glucose-Capillary: 377 mg/dL — ABNORMAL HIGH (ref 70–99)
Glucose-Capillary: 382 mg/dL — ABNORMAL HIGH (ref 70–99)
Glucose-Capillary: 189 mg/dL — ABNORMAL HIGH (ref 70–99)

## 2022-09-08 LAB — BASIC METABOLIC PANEL
Anion gap: 19 — ABNORMAL HIGH (ref 5–15)
Anion gap: 12 (ref 5–15)
BUN: 41 mg/dL — ABNORMAL HIGH (ref 6–20)
BUN: 51 mg/dL — ABNORMAL HIGH (ref 6–20)
CO2: 15 mmol/L — ABNORMAL LOW (ref 22–32)
CO2: 20 mmol/L — ABNORMAL LOW (ref 22–32)
Calcium: 8.1 mg/dL — ABNORMAL LOW (ref 8.9–10.3)
Calcium: 7.9 mg/dL — ABNORMAL LOW (ref 8.9–10.3)
Chloride: 101 mmol/L (ref 98–111)
Chloride: 101 mmol/L (ref 98–111)
Creatinine, Ser: 6.99 mg/dL — ABNORMAL HIGH (ref 0.44–1.00)
Creatinine, Ser: 7.78 mg/dL — ABNORMAL HIGH (ref 0.44–1.00)
GFR, Estimated: 8 mL/min — ABNORMAL LOW (ref >60)
GFR, Estimated: 7 mL/min — ABNORMAL LOW (ref >60)
Glucose, Bld: 330 mg/dL — ABNORMAL HIGH (ref 70–99)
Glucose, Bld: 344 mg/dL — ABNORMAL HIGH (ref 70–99)
Potassium: 3.5 mmol/L (ref 3.5–5.1)
Potassium: 3.2 mmol/L — ABNORMAL LOW (ref 3.5–5.1)
Sodium: 135 mmol/L (ref 135–145)
Sodium: 133 mmol/L — ABNORMAL LOW (ref 135–145)

## 2022-09-08 LAB — CBC
HCT: 41.3 % (ref 36.0–46.0)
HCT: 37.9 % (ref 36.0–46.0)
Hemoglobin: 12.4 g/dL (ref 12.0–15.0)
Hemoglobin: 11.3 g/dL — ABNORMAL LOW (ref 12.0–15.0)
MCH: 25.6 pg — ABNORMAL LOW (ref 26.0–34.0)
MCH: 25.9 pg — ABNORMAL LOW (ref 26.0–34.0)
MCHC: 30 g/dL (ref 30.0–36.0)
MCHC: 29.8 g/dL — ABNORMAL LOW (ref 30.0–36.0)
MCV: 85.3 fL (ref 80.0–100.0)
MCV: 86.9 fL (ref 80.0–100.0)
Platelets: 176 10*3/uL (ref 150–400)
Platelets: 181 10*3/uL (ref 150–400)
RBC: 4.84 MIL/uL (ref 3.87–5.11)
RBC: 4.36 MIL/uL (ref 3.87–5.11)
RDW: 17 % — ABNORMAL HIGH (ref 11.5–15.5)
RDW: 16.9 % — ABNORMAL HIGH (ref 11.5–15.5)
WBC: 19.7 10*3/uL — ABNORMAL HIGH (ref 4.0–10.5)
WBC: 24.4 10*3/uL — ABNORMAL HIGH (ref 4.0–10.5)
nRBC: 0 % (ref 0.0–0.2)
nRBC: 0 % (ref 0.0–0.2)

## 2022-09-08 LAB — PHOSPHORUS: Phosphorus: 4.6 mg/dL (ref 2.5–4.6)

## 2022-09-08 LAB — CULTURE, BLOOD (ROUTINE X 2)

## 2022-09-08 LAB — RENAL FUNCTION PANEL
Albumin: 3 g/dL — ABNORMAL LOW (ref 3.5–5.0)
Anion gap: 20 — ABNORMAL HIGH (ref 5–15)
BUN: 50 mg/dL — ABNORMAL HIGH (ref 6–20)
CO2: 12 mmol/L — ABNORMAL LOW (ref 22–32)
Calcium: 7.8 mg/dL — ABNORMAL LOW (ref 8.9–10.3)
Chloride: 101 mmol/L (ref 98–111)
Creatinine, Ser: 7.48 mg/dL — ABNORMAL HIGH (ref 0.44–1.00)
GFR, Estimated: 7 mL/min — ABNORMAL LOW (ref >60)
Glucose, Bld: 415 mg/dL — ABNORMAL HIGH (ref 70–99)
Phosphorus: 6.4 mg/dL — ABNORMAL HIGH (ref 2.5–4.6)
Potassium: 3.5 mmol/L (ref 3.5–5.1)
Sodium: 133 mmol/L — ABNORMAL LOW (ref 135–145)

## 2022-09-08 LAB — RESPIRATORY PANEL BY PCR

## 2022-09-08 LAB — BLOOD GAS, VENOUS
Acid-base deficit: 4.7 mmol/L — ABNORMAL HIGH (ref 0.0–2.0)
Bicarbonate: 19.5 mmol/L — ABNORMAL LOW (ref 20.0–28.0)
O2 Saturation: 88.7 %
Patient temperature: 37
pCO2, Ven: 33 mmHg — ABNORMAL LOW (ref 44–60)
pH, Ven: 7.38 (ref 7.25–7.43)
pO2, Ven: 57 mmHg — ABNORMAL HIGH (ref 32–45)

## 2022-09-08 LAB — PROCALCITONIN: Procalcitonin: 1.66 ng/mL

## 2022-09-08 LAB — HIV ANTIBODY (ROUTINE TESTING W REFLEX): HIV Screen 4th Generation wRfx: NONREACTIVE

## 2022-09-08 LAB — HEMOGLOBIN A1C
Hgb A1c MFr Bld: 10.4 % — ABNORMAL HIGH (ref 4.8–5.6)
Mean Plasma Glucose: 251.78 mg/dL

## 2022-09-08 LAB — BETA-HYDROXYBUTYRIC ACID
Beta-Hydroxybutyric Acid: 1.39 mmol/L — ABNORMAL HIGH (ref 0.05–0.27)
Beta-Hydroxybutyric Acid: 0.52 mmol/L — ABNORMAL HIGH (ref 0.05–0.27)

## 2022-09-08 LAB — C DIFFICILE QUICK SCREEN W PCR REFLEX
C Diff antigen: POSITIVE — AB
C Diff toxin: NEGATIVE

## 2022-09-08 LAB — CLOSTRIDIUM DIFFICILE BY PCR, REFLEXED: Toxigenic C. Difficile by PCR: NEGATIVE

## 2022-09-08 LAB — MAGNESIUM: Magnesium: 2.2 mg/dL (ref 1.7–2.4)

## 2022-09-08 MED ORDER — METRONIDAZOLE 500 MG/100ML IV SOLN
500.0000 mg | Freq: Two times a day (BID) | INTRAVENOUS | Status: DC
  Administered 2022-09-08 (×2): 500 mg via INTRAVENOUS
  Filled 2022-09-08 (×3): qty 100

## 2022-09-08 MED ORDER — LIDOCAINE-PRILOCAINE 2.5-2.5 % EX CREA: 1.0000 | TOPICAL_CREAM | CUTANEOUS | Status: DC | PRN

## 2022-09-08 MED ORDER — ALTEPLASE 2 MG IJ SOLR: 2.0000 mg | Freq: Once | INTRAMUSCULAR | Status: DC | PRN

## 2022-09-08 MED ORDER — SEVELAMER CARBONATE 800 MG PO TABS
2400.0000 mg | ORAL_TABLET | ORAL | Status: AC
  Administered 2022-09-08: 2400 mg via ORAL
  Filled 2022-09-08: qty 3

## 2022-09-08 MED ORDER — OXYCODONE-ACETAMINOPHEN 5-325 MG PO TABS
1.0000 | ORAL_TABLET | Freq: Once | ORAL | Status: DC
  Filled 2022-09-08: qty 1

## 2022-09-08 MED ORDER — SEVELAMER CARBONATE 800 MG PO TABS
2400.0000 mg | ORAL_TABLET | Freq: Three times a day (TID) | ORAL | Status: DC
  Administered 2022-09-08: 2400 mg via ORAL
  Filled 2022-09-08: qty 3

## 2022-09-08 MED ORDER — LACTATED RINGERS IV SOLN: INTRAVENOUS | Status: DC

## 2022-09-08 MED ORDER — VANCOMYCIN HCL 125 MG PO CAPS
125.0000 mg | ORAL_CAPSULE | Freq: Every day | ORAL | Status: DC
  Administered 2022-09-08: 125 mg via ORAL
  Filled 2022-09-08 (×2): qty 1

## 2022-09-08 MED ORDER — CHLORHEXIDINE GLUCONATE CLOTH 2 % EX PADS: 6.0000 | MEDICATED_PAD | Freq: Every day | CUTANEOUS | Status: DC

## 2022-09-08 MED ORDER — ANTICOAGULANT SODIUM CITRATE 4% (200MG/5ML) IV SOLN: 5.0000 mL | Status: DC | PRN

## 2022-09-08 MED ORDER — INSULIN ASPART 100 UNIT/ML IJ SOLN
0.0000 [IU] | Freq: Three times a day (TID) | INTRAMUSCULAR | Status: DC
  Administered 2022-09-08: 5 [IU] via SUBCUTANEOUS
  Administered 2022-09-08: 6 [IU] via SUBCUTANEOUS
  Filled 2022-09-08 (×2): qty 1

## 2022-09-08 MED ORDER — PENTAFLUOROPROP-TETRAFLUOROETH EX AERO: 1.0000 | INHALATION_SPRAY | CUTANEOUS | Status: DC | PRN

## 2022-09-08 MED ORDER — SODIUM CHLORIDE 0.9 % IV SOLN
1.0000 g | INTRAVENOUS | Status: DC
  Administered 2022-09-08: 1 g via INTRAVENOUS
  Filled 2022-09-08 (×2): qty 10

## 2022-09-08 MED ORDER — ACETAMINOPHEN 325 MG PO TABS
650.0000 mg | ORAL_TABLET | Freq: Four times a day (QID) | ORAL | Status: DC | PRN
  Administered 2022-09-08: 650 mg via ORAL
  Filled 2022-09-08: qty 2

## 2022-09-08 MED ORDER — ORAL CARE MOUTH RINSE
15.0000 mL | OROMUCOSAL | Status: DC
  Administered 2022-09-08 (×2): 15 mL via OROMUCOSAL

## 2022-09-08 MED ORDER — VANCOMYCIN HCL 750 MG/150ML IV SOLN: 750.0000 mg | INTRAVENOUS | Status: DC

## 2022-09-08 MED ORDER — HEPARIN SODIUM (PORCINE) 1000 UNIT/ML DIALYSIS: 1000.0000 [IU] | INTRAMUSCULAR | Status: DC | PRN

## 2022-09-08 MED ORDER — ORAL CARE MOUTH RINSE: 15.0000 mL | OROMUCOSAL | Status: DC | PRN

## 2022-09-08 MED ORDER — LIDOCAINE HCL (PF) 1 % IJ SOLN: 5.0000 mL | INTRAMUSCULAR | Status: DC | PRN

## 2022-09-08 MED ORDER — LACTATED RINGERS IV BOLUS
20.0000 mL/kg | Freq: Once | INTRAVENOUS | Status: AC
  Administered 2022-09-08: 1186 mL via INTRAVENOUS

## 2022-09-08 MED ORDER — INSULIN REGULAR(HUMAN) IN NACL 100-0.9 UT/100ML-% IV SOLN: INTRAVENOUS | Status: DC

## 2022-09-08 MED ORDER — DEXTROSE 50 % IV SOLN: 0.0000 mL | INTRAVENOUS | Status: DC | PRN

## 2022-09-08 MED ORDER — POTASSIUM CHLORIDE 10 MEQ/100ML IV SOLN
10.0000 meq | INTRAVENOUS | Status: DC
  Filled 2022-09-08 (×2): qty 100

## 2022-09-08 MED ORDER — INSULIN ASPART 100 UNIT/ML IJ SOLN: 0.0000 [IU] | Freq: Every day | INTRAMUSCULAR | Status: DC

## 2022-09-08 MED ORDER — METRONIDAZOLE 500 MG/100ML IV SOLN: 500.0000 mg | Freq: Two times a day (BID) | INTRAVENOUS | Status: DC

## 2022-09-08 MED ORDER — DEXTROSE IN LACTATED RINGERS 5 % IV SOLN: INTRAVENOUS | Status: DC

## 2022-09-08 MED ORDER — HEPARIN SODIUM (PORCINE) 5000 UNIT/ML IJ SOLN
5000.0000 [IU] | Freq: Three times a day (TID) | INTRAMUSCULAR | Status: DC
  Administered 2022-09-08: 5000 [IU] via SUBCUTANEOUS
  Filled 2022-09-08: qty 1

## 2022-09-08 MED ORDER — VANCOMYCIN HCL IN DEXTROSE 1-5 GM/200ML-% IV SOLN
1000.0000 mg | Freq: Once | INTRAVENOUS | Status: AC
  Administered 2022-09-08: 1000 mg via INTRAVENOUS
  Filled 2022-09-08: qty 200

## 2022-09-08 NOTE — Progress Notes (Signed)
Pt very upset now because she was told she will be NPO. & Starting on insulin drip.  She threatening to leave AMA. Patient have been informed of the risk associated with leaving without following medical recommendations.Patient stated she understood the risk. MD was made aware. Patient stated ' I don't care, this is not my hospital, I will go to [facility] which is my hospital." . Pt called her brother to come and get.  Pt stated " I will wait until my dialysis is finished & I will leave". Handoff to night RN tracy RN

## 2022-09-08 NOTE — Plan of Care (Signed)
Dicussed with patient about her wanting to leave AMA - against medical advice.  Stated again how sick she was and close to be intubated last night.  Emphasized with her about her condition and how sick she is at this time.   Patient signed paper.  Patient also, stated she wasn't waiting for dialysis to finish which would take 2 more hours.  She stated "Stop the dialysis treatment now, I will go to [facility]."    Brother brought her clothes, will take out IV's.  Patient was alert and oriented to person, place, time and situation when making this abrupt decision.    Problem: Education: Goal: Knowledge of General Education information will improve Description: Including pain rating scale, medication(s)/side effects and non-pharmacologic comfort measures Outcome: Not Met (add Reason)   Problem: Clinical Measurements: Goal: Ability to maintain clinical measurements within normal limits will improve Outcome: Not Met (add Reason) Goal: Will remain free from infection Outcome: Not Met (add Reason) Goal: Diagnostic test results will improve Outcome: Not Met (add Reason) Goal: Respiratory complications will improve Outcome: Not Met (add Reason) Goal: Cardiovascular complication will be avoided Outcome: Not Met (add Reason)   Problem: Health Behavior/Discharge Planning: Goal: Ability to manage health-related needs will improve Outcome: Not Met (add Reason)   Problem: Activity: Goal: Risk for activity intolerance will decrease Outcome: Not Met (add Reason)   Problem: Nutrition: Goal: Adequate nutrition will be maintained Outcome: Not Met (add Reason)   Problem: Coping: Goal: Level of anxiety will decrease Outcome: Not Met (add Reason)   Problem: Elimination: Goal: Will not experience complications related to bowel motility Outcome: Not Met (add Reason) Goal: Will not experience complications related to urinary retention Outcome: Not Met (add Reason)   Problem: Pain  Managment: Goal: General experience of comfort will improve Outcome: Not Met (add Reason)   Problem: Safety: Goal: Ability to remain free from injury will improve Outcome: Not Met (add Reason)   Problem: Skin Integrity: Goal: Risk for impaired skin integrity will decrease Outcome: Not Met (add Reason)   Problem: Education: Goal: Ability to describe self-care measures that may prevent or decrease complications (Diabetes Survival Skills Education) will improve Outcome: Not Met (add Reason) Goal: Individualized Educational Video(s) Outcome: Not Met (add Reason)   Problem: Coping: Goal: Ability to adjust to condition or change in health will improve Outcome: Not Met (add Reason)   Problem: Fluid Volume: Goal: Ability to maintain a balanced intake and output will improve Outcome: Not Met (add Reason)   Problem: Health Behavior/Discharge Planning: Goal: Ability to identify and utilize available resources and services will improve Outcome: Not Met (add Reason) Goal: Ability to manage health-related needs will improve Outcome: Not Met (add Reason)   Problem: Metabolic: Goal: Ability to maintain appropriate glucose levels will improve Outcome: Not Met (add Reason)   Problem: Nutritional: Goal: Maintenance of adequate nutrition will improve Outcome: Not Met (add Reason) Goal: Progress toward achieving an optimal weight will improve Outcome: Not Met (add Reason)   Problem: Skin Integrity: Goal: Risk for impaired skin integrity will decrease Outcome: Not Met (add Reason)   Problem: Tissue Perfusion: Goal: Adequacy of tissue perfusion will improve Outcome: Not Met (add Reason)   Problem: Education: Goal: Ability to describe self-care measures that may prevent or decrease complications (Diabetes Survival Skills Education) will improve Outcome: Not Met (add Reason) Goal: Individualized Educational Video(s) Outcome: Not Met (add Reason)   Problem: Cardiac: Goal: Ability to  maintain an adequate cardiac output will improve Outcome: Not  Met (add Reason)   Problem: Health Behavior/Discharge Planning: Goal: Ability to identify and utilize available resources and services will improve Outcome: Not Met (add Reason) Goal: Ability to manage health-related needs will improve Outcome: Not Met (add Reason)   Problem: Fluid Volume: Goal: Ability to achieve a balanced intake and output will improve Outcome: Not Met (add Reason)   Problem: Nutritional: Goal: Maintenance of adequate nutrition will improve Outcome: Not Met (add Reason) Goal: Maintenance of adequate weight for body size and type will improve Outcome: Not Met (add Reason)   Problem: Metabolic: Goal: Ability to maintain appropriate glucose levels will improve Outcome: Not Met (add Reason)   Problem: Respiratory: Goal: Will regain and/or maintain adequate ventilation Outcome: Not Met (add Reason)   Problem: Urinary Elimination: Goal: Ability to achieve and maintain adequate renal perfusion and functioning will improve Outcome: Not Met (add Reason)   Problem: Urinary Elimination: Goal: Ability to achieve and maintain adequate renal perfusion and functioning will improve Outcome: Not Met (add Reason)   Problem: Respiratory: Goal: Will regain and/or maintain adequate ventilation Outcome: Not Met (add Reason)

## 2022-09-08 NOTE — Progress Notes (Addendum)
Patient admitted to ICU with toxic metabolic encephalopathy in the setting of severe hypoglycemia and Hypoglycemia Induced Seizures. Following HD treatment,  Patient expressed desire to leave the Hospital prior to completion of medical treatment. She was advised that she is currently in DKA and would need additional hospital stay to stabilized her blood sugar. She was also informed that she presented with profound hypoglycemia with resultant seizures likely due to her inability to correctly use her insulin pump and would need further education on pump usage. Patient has been warned that this is not Medically advisable at this time, and can result in Medical complications like Death and Disability, patient understands and accepts the risks involved and assumes full responsibilty of this decision. Patient is alert and oriented x4 and capable of making her own medical decisions. Patient proceeded to leave the hospital AMA accompanied by a family member. All her home medications were resumed at discharge. She was instructed to return to the ED with any medical issues. Patient verbalized understanding.   Webb Silversmith, DNP, CCRN, FNP-C, AGACNP-BC Acute Care & Family Nurse Practitioner  [facility]  See Amion for personal pager PCCM on call pager [phone] until 7 am

## 2022-09-08 NOTE — Consult Note (Signed)
PHARMACY CONSULT NOTE  Pharmacy Consult for Electrolyte Monitoring and Replacement   Recent Labs: Potassium (mmol/L)  Date Value  09/08/2022 3.5  11/12/2013 4.2   Magnesium (mg/dL)  Date Value  16/01/9603 2.2  11/11/2013 1.5 (L)   Calcium (mg/dL)  Date Value  54/12/8117 8.1 (L)   Calcium, Total (mg/dL)  Date Value  14/78/2956 8.2 (L)   Albumin (g/dL)  Date Value  21/30/8657 3.3 (L)  09/08/2019 2.9 (L)  11/10/2013 3.9   Phosphorus (mg/dL)  Date Value  84/69/6295 4.6   Sodium (mmol/L)  Date Value  09/08/2022 135  07/13/2021 136  11/12/2013 137     Assessment: 32 y.o  female with significant PMH of ,T1DM on Insulin pump, ESRD Dialysis T/T/Sat  presented to ED from home unresponsive. Patient found to be hypoglycemia with CBG 48 with seizure-like activity PTA. Pharmacy has been consulted to monitor and replace electrolytes as needed while patient admitted to ICU.   Goal of Therapy:  Electrolytes WNL  Plan:  ---no electrolyte replacement  ---Recheck  electrolytes with AM labs   Lowella Bandy, PharmD, BCPS Clinical Pharmacist   09/08/2022 7:28 AM

## 2022-09-08 NOTE — Consult Note (Signed)
Cardiology Consult    Patient ID: Ashley Freeman MRN: [id number], DOB/AGE: Oct 13, 1990   Admit date: 09/07/2022 Date of Consult: 09/08/2022  Primary Physician: [facility] Primary Primary Cardiologist: Bryan Lemma, MD Requesting Provider: Silverio Decamp, MD  Patient Profile    Ashley Freeman is a 32 y.o. female with a history of type I DM, ESRD on HD (TTS since 01/2022), HTN, HL, bipolar d/o, NICM, and HFrEF, who is being seen today for the evaluation of cardiomyopathy in setting of admission for hypoglycemia at the request of Dr. Aundria Rud.  Past Medical History   Past Medical History:  Diagnosis Date   Abscess, gluteal, right 08/24/2013   Anemia 02/19/2012   Bartholin's gland abscess 09/19/2013   Bipolar disorder (HCC)    BV (bacterial vaginosis) 11/24/2015   Depression    Diabetes mellitus type I (HCC) 2001   Diagnosed at age 45 ; Type I   Diarrhea 05/30/2016   DKA (diabetic ketoacidoses) 08/19/2013   Also in 2018   ESRD (end stage renal disease) (HCC)    Gonorrhea 08/2011   Treated in 09/2011   HFrEF (heart failure with reduced ejection fraction) (HCC)    a. 2022 Echo: EF 40%; b. 10/2021 Echo: EF 55%; b. 07/2022 MV: No ischemia. EF 31%; c. 08/2022 Echo: EF 35%, mildly dil RV, sev TR.   History of trichomoniasis 05/31/2016   Hyperlipidemia 03/28/2016   Hypertension    NICM (nonischemic cardiomyopathy) (HCC)    Sepsis (HCC) 09/19/2013    Past Surgical History:  Procedure Laterality Date   AV FISTULA PLACEMENT Right 07/06/2022   Procedure: ARTERIOVENOUS GRAFT CREATION;  Surgeon: Cephus Shelling, MD;  Location: [facility] OR;  Service: Vascular;  Laterality: Right;   CESAREAN SECTION N/A 10/05/2019   Procedure: CESAREAN SECTION;  Surgeon: Dansville Bing, MD;  Location: [facility] LD ORS;  Service: Obstetrics;  Laterality: N/A;   INCISION AND DRAINAGE ABSCESS Left 09/28/2019   Procedure: INCISION AND DRAINAGE VULVAR ABCESS;  Surgeon: Tilda Burrow, MD;  Location: [facility]  OR;  Service: Gynecology;  Laterality: Left;   INCISION AND DRAINAGE PERIRECTAL ABSCESS Right 08/18/2013   Procedure: IRRIGATION AND DEBRIDEMENT GLUTEAL ABSCESS;  Surgeon: Axel Filler, MD;  Location: [facility] OR;  Service: General;  Laterality: Right;   INCISION AND DRAINAGE PERIRECTAL ABSCESS Right 09/19/2013   Procedure: IRRIGATION AND DEBRIDEMENT RIGHT GLUTEAL AND LABIAL ABSCESSES;  Surgeon: Axel Filler, MD;  Location: [facility] OR;  Service: General;  Laterality: Right;   INCISION AND DRAINAGE PERIRECTAL ABSCESS Right 09/24/2013   Procedure: IRRIGATION AND DEBRIDEMENT PERIRECTAL ABSCESS;  Surgeon: Cherylynn Ridges, MD;  Location: [facility] OR;  Service: General;  Laterality: Right;     Allergies  Allergies  Allergen Reactions   Cephalexin Anaphylaxis    Has gotten ceftriaxone in the past    Penicillins Hives and Rash    Has patient had a PCN reaction causing immediate rash, facial/tongue/throat swelling, SOB or lightheadedness with hypotension: Yes Has patient had a PCN reaction causing severe rash involving mucus membranes or skin necrosis: No Has patient had a PCN reaction that required hospitalization: Yes Has patient had a PCN reaction occurring within the last 10 years: No Spoke with pt - childhood hives told by mom, tried no pcns since, doesn't remember reaction herself    Benadryl [Diphenhydramine] Itching   Doxycycline Itching    History of Present Illness    32 y.o. female with a history of type I DM, ESRD on HD (TTS since 01/2022), HTN, HL, bipolar  d/o, and NICM.  She has been followed @ [facility].  Echo in 2022 showed EF of 40%, though this improved by echo in 2023.  She has been maintained on ? blocker and ARB.  As part of transplant eval, she underwent Myoview in 07/2022, which showed no ischemia however EF was recorded @ 31%.  She saw [facility] cardiology on 5/3 and c/o orthopnea and lower ext edema.  Torsemide was inc to 80 BID on nondialysis days and repeat echo was ordered and performed on on 5/8,  which confirmed LV dysfxn w/ EF of 35%.  Separately, she was admitted to [facility] in late April in the setting of DKA.  She has since been using her insulin pump and on 5/10, family found her unresponsive w/ seizure like activity.  Blood gluc was 48 on EMS arrival.  She was treated w/ dextrose and glucagon and taken to the [facility] ED.  She was initially minimally responsive and was again noted to have seizure like activity w/ vomiting.  She was admitted to ICU and developed tonic-clonic seizures.  She was treated w/ ativan and dextrose with improved mentation.  CT head negative.  BNP elevated @ 2669.4 in the setting of admission creat of 6.17.  We've been asked to eval due to cardiac history.  Pt denies any recent h/o chest pain or dyspnea.  She is typically able to complete house chores/routine activities w/o limitations.  Inpatient Medications     Chlorhexidine Gluconate Cloth  6 each Topical Daily   heparin  5,000 Units Subcutaneous Q8H   insulin aspart  0-5 Units Subcutaneous QHS   insulin aspart  0-6 Units Subcutaneous TID WC   mouth rinse  15 mL Mouth Rinse 4 times per day   sevelamer carbonate  2,400 mg Oral TID WC   vancomycin  125 mg Oral Daily    Family History    Family History  Problem Relation Age of Onset   Asthma Mother    Carpal tunnel syndrome Mother    Gout Father    Diabetes Paternal Grandmother    Anesthesia problems Neg Hx    She indicated that her mother is alive. She indicated that her father is alive. She indicated that the status of her paternal grandmother is unknown. She indicated that the status of her neg hx is unknown.   Social History    Social History   Socioeconomic History   Marital status: Single    Spouse name: Not on file   Number of children: 0   Years of education: 11th grade   Highest education level: Not on file  Occupational History   Occupation: unemployed    Comment: has never worked  Tobacco Use   Smoking status: Never    Passive  exposure: Never   Smokeless tobacco: Never  Vaping Use   Vaping Use: Never used  Substance and Sexual Activity   Alcohol use: Not Currently   Drug use: No   Sexual activity: Yes    Birth control/protection: None  Other Topics Concern   Not on file  Social History Narrative   Patient lives in [place] mother lives in [place].  Unemployed.  Previously worked for a Customer service manager.  Completed 11 grade working on [employer]. Patient 3 brothers    Social Determinants of Health   Financial Resource Strain: High Risk (08/24/2021)   Overall Financial Resource Strain (CARDIA)    Difficulty of Paying Living Expenses: Very hard  Food Insecurity: Patient Declined (09/08/2022)   Hunger  Vital Sign    Worried About Programme researcher, broadcasting/film/video in the Last Year: Patient declined    Ran Out of Food in the Last Year: Patient declined  Transportation Needs: Patient Declined (09/08/2022)   PRAPARE - Administrator, Civil Service (Medical): Patient declined    Lack of Transportation (Non-Medical): Patient declined  Physical Activity: Not on file  Stress: Not on file  Social Connections: Not on file  Intimate Partner Violence: Patient Declined (09/08/2022)   Humiliation, Afraid, Rape, and Kick questionnaire    Fear of Current or Ex-Partner: Patient declined    Emotionally Abused: Patient declined    Physically Abused: Patient declined    Sexually Abused: Patient declined     Review of Systems    General:  No chills, fever, night sweats or weight changes.  Cardiovascular:  No chest pain, dyspnea on exertion, +++ edema, +++ orthopnea, no palpitations, paroxysmal nocturnal dyspnea. Dermatological: No rash, lesions/masses Respiratory: No cough, dyspnea Urologic: No hematuria, dysuria Abdominal:   No nausea, +++ vomiting, no diarrhea, bright red blood per rectum, melena, or hematemesis Neurologic:  +++ AMS/wkns in setting of hypoglycemia.  All other systems reviewed and are otherwise negative  except as noted above.  Physical Exam    Blood pressure (!) 106/45, pulse (!) 103, temperature 98.9 F (37.2 C), resp. rate (!) 0, height 5\' 3"  (1.6 m), weight 59.3 kg, SpO2 96 %, unknown if currently breastfeeding.  General: Pleasant, NAD Psych: Normal affect. Neuro: Alert and oriented X 3. Moves all extremities spontaneously. HEENT: Normal  Neck: Supple without bruits or JVD. Lungs:  Resp regular and unlabored, CTA. Heart: RRR no s3, s4, or murmurs. Abdomen: Soft, non-tender, non-distended, BS + x 4.  Extremities: No clubbing, cyanosis or edema. DP/PT2+, Radials 2+ and equal bilaterally.  Labs      BNP    Component Value Date/Time   BNP 2,669.4 (H) 09/07/2022 2000   Lab Results  Component Value Date   WBC 19.7 (H) 09/08/2022   HGB 12.4 09/08/2022   HCT 41.3 09/08/2022   MCV 85.3 09/08/2022   PLT 176 09/08/2022    Recent Labs  Lab 09/07/22 1802 09/08/22 0634  NA 140 135  K 2.6* 3.5  CL 103 101  CO2 25 15*  BUN 36* 41*  CREATININE 6.17* 6.99*  CALCIUM 8.4* 8.1*  PROT 6.4*  --   BILITOT 0.8  --   ALKPHOS 233*  --   ALT 43  --   AST 33  --   GLUCOSE 77 330*   Lab Results  Component Value Date   CHOL 235 (H) 05/24/2021   HDL 54.70 05/24/2021   LDLCALC 149 (H) 05/24/2021   TRIG 157.0 (H) 05/24/2021   Lab Results  Component Value Date   DDIMER 1.44 (H) 03/24/2021      Radiology Studies    CT ABDOMEN PELVIS WO CONTRAST  Result Date: 09/07/2022 CLINICAL DATA:  Unresponsive EXAM: CT ABDOMEN AND PELVIS WITHOUT CONTRAST TECHNIQUE: Multidetector CT imaging of the abdomen and pelvis was performed following the standard protocol without IV contrast. RADIATION DOSE REDUCTION: This exam was performed according to the departmental dose-optimization program which includes automated exposure control, adjustment of the mA and/or kV according to patient size and/or use of iterative reconstruction technique. COMPARISON:  Chest x-ray 09/07/2022, CT 09/30/2019, 02/13/2016  FINDINGS: Lower chest: Lung bases demonstrate heterogeneous airspace disease in the left greater than right lower lobes. Cardiomegaly. Hepatobiliary: Distended gallbladder without calcified stone. No biliary dilatation.  Pancreas: Unremarkable. No pancreatic ductal dilatation or surrounding inflammatory changes. Spleen: Normal in size without focal abnormality. Adrenals/Urinary Tract: Adrenal glands are within normal limits. Kidneys show no hydronephrosis. The bladder is unremarkable Stomach/Bowel: The stomach is nonenlarged. Few air distended loops of small bowel in the central pelvis but no convincing obstruction or bowel wall thickening. Negative appendix Vascular/Lymphatic: Advanced vascular calcifications of the mesenteric and pelvic branch vessels. No suspicious lymph nodes. Reproductive: Uterus unremarkable.  No adnexal mass. Other: Negative for pelvic effusion or free air Musculoskeletal: No acute or suspicious osseous abnormality IMPRESSION: 1. No CT evidence for acute intra-abdominal or pelvic abnormality. 2. Heterogeneous airspace disease in the left greater than right lower lobes, suspect for pneumonia. Cardiomegaly. Electronically Signed   By: Jasmine Pang M.D.   On: 09/07/2022 22:27   CT Head Wo Contrast  Result Date: 09/07/2022 CLINICAL DATA:  Altered mental status EXAM: CT HEAD WITHOUT CONTRAST TECHNIQUE: Contiguous axial images were obtained from the base of the skull through the vertex without intravenous contrast. RADIATION DOSE REDUCTION: This exam was performed according to the departmental dose-optimization program which includes automated exposure control, adjustment of the mA and/or kV according to patient size and/or use of iterative reconstruction technique. COMPARISON:  None Available. FINDINGS: Brain: There is no mass, hemorrhage or extra-axial collection. The size and configuration of the ventricles and extra-axial CSF spaces are normal. The brain parenchyma is normal, without acute  or chronic infarction. Vascular: No abnormal hyperdensity of the major intracranial arteries or dural venous sinuses. No intracranial atherosclerosis. Skull: The visualized skull base, calvarium and extracranial soft tissues are normal. Sinuses/Orbits: No fluid levels or advanced mucosal thickening of the visualized paranasal sinuses. No mastoid or middle ear effusion. The orbits are normal. IMPRESSION: Normal head CT. Electronically Signed   By: Deatra Robinson M.D.   On: 09/07/2022 22:22   DG Chest Port 1 View  Result Date: 09/07/2022 CLINICAL DATA:  Vomiting EXAM: PORTABLE CHEST 1 VIEW COMPARISON:  11/06/2021 FINDINGS: Right-sided central venous catheter with tips over the SVC and right atrium. Cardiomegaly with suspected trace pleural effusions. Mild vascular congestion and diffuse interstitial opacities suspicious for mild edema. Minimal atelectasis, pneumonia or aspiration left base. No pneumothorax. Hair artifact over the upper chest. IMPRESSION: Cardiomegaly with mild vascular congestion and suspected mild edema. Minimal atelectasis, pneumonia or aspiration at the left base. Trace pleural effusions. Electronically Signed   By: Jasmine Pang M.D.   On: 09/07/2022 19:04    ECG & Cardiac Imaging    RSR, 82, LAE, prolonged QT - personally reviewed.  Assessment & Plan    1.  Hypoglycemia/Type I DM:  recent admission to [facility] for DKA in setting of not using insulin pump.  Now using and believes that when she was changing cartridge that she might have accidentally overdosed herself w/ insulin  Hypoglycemia  AMS/unresponsiveness  Seizures. Managed by CCM and currently in no distress. Sugars currently elevated.  2.  NICM:  h/o NICM w/ EF of 40% in 2022, improving by echo in 2023, but then noted to have EF of 31% on recent myoview (no ischemia) w/ subsequent report of lower ext edema and orthopnea req titration of torsemide on non-dialysis days.  F/u echo 5/8 w/ reduced EF @ 35%.  BNP elevated on admission  for above in setting of creat >6.  Denies recent c/p or dyspnea.  Body habitus makes exam challenging.  Volume mgmt per nephrology/HD.  Resume ? blocker/ARB when clinically appropriate.  Not candidate for spiro  due to renal failure and sglt2i due to Type I DM.  Barring cardiac decompensation during this admission, defer further w/u of recently reduced LV fxn to her primary cardiology team @ [facility].  3.  ESRD:  HD per nephrology.  4.  Essential HTN:  pressures currently soft. Home doses of ? blocker and ARB on hold.  5.  HL:  resume statin therapy @ d/c.  Signed, Nicolasa Ducking, NP 09/08/2022, 12:53 PM  For questions or updates, please contact   Please consult [email or web address] for contact info under Cardiology/STEMI.

## 2022-09-08 NOTE — Progress Notes (Signed)
Patient walked out by herself with brother and baby waiting downstairs at 37.

## 2022-09-08 NOTE — Inpatient Diabetes Management (Signed)
Inpatient Diabetes Program Recommendations  AACE/ADA: New Consensus Statement on Inpatient Glycemic Control (2015)  Target Ranges:  Prepandial:   less than 140 mg/dL      Peak postprandial:   less than 180 mg/dL (1-2 hours)      Critically ill patients:  140 - 180 mg/dL   Lab Results  Component Value Date   GLUCAP 286 (H) 09/08/2022   HGBA1C 8.5 (A) 03/15/2022    Review of Glycemic Control  Latest Reference Range & Units 09/08/22 09:32 09/08/22 10:17 09/08/22 11:56 09/08/22 14:15  Glucose-Capillary 70 - 99 mg/dL 161 (H) 096 (H) 045 (H) 286 (H)   Diabetes history: DM 1/ ESRD Outpatient Diabetes medications:  Unsure if patient started new Tandem pump Orders from Endocrinology were: Basal rate at midnight till 11 AM  0.5 unit/hour Basal rate at 11 AM till midnight 0.6 units/hour   Total basal=13.3 units/24 hours Insulin to carb ratio Midnight to Midnight  1 : 12 Sensitivity factor ( correction) 50  Glucose Target 120  Current orders for Inpatient glycemic control:  Novolog 0-6 units tid with meals and HS  Inpatient Diabetes Program Recommendations:    Note patient admitted with severe hypoglycemia. Unclear what happened?   Current labs indicate possibly DKA?  VBG being ordered by MD.  If patient is in DKA, please add IV insulin/DKA orders.   If not, consider changing Novolog to 0-6 units q 4 hours and add Semglee 6 units daily.  Would not recommend restart of insulin pump at discharge until she is seen by endocrinology/pump trainer.   Thanks  Beryl Meager, RN, BC-ADM Inpatient Diabetes Coordinator Pager [phone]  (8a-5p)

## 2022-09-08 NOTE — H&P (Incomplete)
NAME:  Ashley Freeman, MRN:  [id number], DOB:  Feb 11, 1991, LOS: 1 ADMISSION DATE:  09/07/2022, CONSULTATION DATE:  09/07/22 REFERRING MD:  Pilar Jarvis CHIEF COMPLAINT:  Unresponsiveness    HPI  32 y.o  female with significant PMH of T1DM on Insulin pump, HFrEF, ESRD Dialysis T/T/Sat  S/p right upper extremity AV graft 07/06/22 Currently undergoing dialysis through right IJ, HTN, and HLD  who presented to the ED with chief complaints of unresponsiveness.   Per ED reports, EMS was called for seizure like activity and unresponsiveness. On EMS arrival, patient had a blood glucose of 48, for which she received dextrose and glucagon with improvement of blood glucose but no change in mentation. On review of her chart, patient was recently admitted at [facility] with DKA. Per [facility] notes, patient stated she was using subq insulin instead of her omnipod. She was advised to use her insulin pump by her endocrinologist but from chart messages it appears she was having difficulties using her insulin pump.   ED Course: Initial vital signs showed HR of beats/minute, BP mm Hg, the RR 30 breaths/minute, and the oxygen saturation % on and a temperature of 98.8F (36.9C). Patient was lethargic, moaned intermittently, and responded with one-word answers; symmetric movement in the arms and legs was observed. Patient was actively vomiting and was treated with Droperidol. Due to high risk for decompensation, PCCM consulted for admission.  Pertinent Labs/Diagnostics Findings: Na+/ K+: 140/2.6 Glucose: 77 BUN/Cr.:36/6.17 WBC:7.5 BNP: 2669.4 VBG:pO2 60; pCO2 50; pH 7.37;  HCO3 28.9, %O2 Sat 89.8.   On arrival to the unit, blood glucose was obtained and showed unreadable on the glucometer. Patient developed tonic-clonic seizures and decorticate posturing prior to receiving dextrose. She received Ativan and was  about to be intubated for airway protection when she woke up and was able to follow commands. Initial CT head  without contrast was negative.  Past Medical History  T1DM on Insulin pump, HFrEF, ESRD Dialysis T/T/Sat  S/p right upper extremity AV graft 07/06/22 Currently undergoing dialysis through right IJ, HTN, and HLD  Significant Hospital Events   5/10:Admit to ICU with toxic metabolic encephalopathy int he setting of severe hypoglycemia and Hypoglycemia Induced Seizures.  Consults:  Neurology  Procedures:  None  Significant Diagnostic Tests:  5/10: Chest Xray: IMPRESSION: Cardiomegaly with mild vascular congestion and suspected mild edema. Minimal atelectasis, pneumonia or aspiration at the left base. Trace pleural effusions.  5/10: Noncontrast CT head: IMPRESSION: Normal head CT.  5/10: CTA abdomen and pelvis: IMPRESSION: 1. No CT evidence for acute intra-abdominal or pelvic abnormality. 2. Heterogeneous airspace disease in the left greater than right lower lobes, suspect for pneumonia. Cardiomegaly.   Interim History / Subjective:  -BG  unreadable. Had another episode of witnessed seizures  Micro Data:  5/10: Blood culture x2> 5/10: Urine Culture> 5/10: MRSA PCR>>   Antimicrobials:  Unasyn  OBJECTIVE  Blood pressure (!) 162/106, pulse 95, temperature (!) 97.5 F (36.4 C), temperature source Oral, resp. rate 11, height 5\' 3"  (1.6 m), weight 59.3 kg, SpO2 97 %, unknown if currently breastfeeding.       No intake or output data in the 24 hours ending 09/08/22 0003 Filed Weights   09/07/22 2241  Weight: 59.3 kg    Physical Examination  GENERAL:  year-old critically ill patient lying in the bed minimally unresponsive EYES: PEERLA. No scleral icterus. Extraocular muscles intact.  HEENT: Head atraumatic, normocephalic. Oropharynx and nasopharynx clear.  NECK:  No JVD, supple  LUNGS: Normal breath sounds bilaterally.  No use of accessory muscles of respiration.  CARDIOVASCULAR: S1, S2 normal. No murmurs, rubs, or gallops.  ABDOMEN: Soft, NTND EXTREMITIES  Capillary  refill is less than 3 seconds in all extremities. Pulses palpable distally. NEUROLOGIC: The patient is altered. Cranial nerves are intact.  SKIN: No obvious rash, lesion, or ulcer. Warm to touch Labs/imaging that I havepersonally reviewed  (right click and "Reselect all SmartList Selections" daily)     Labs   CBC: Recent Labs  Lab 09/07/22 1802  WBC 7.4  NEUTROABS 3.9  HGB 12.9  HCT 43.1  MCV 85.9  PLT 175    Basic Metabolic Panel: Recent Labs  Lab 09/07/22 1802 09/07/22 1909  NA 140  --   K 2.6*  --   CL 103  --   CO2 25  --   GLUCOSE 77  --   BUN 36*  --   CREATININE 6.17*  --   CALCIUM 8.4*  --   MG  --  2.8*   GFR: Estimated Creatinine Clearance: 11 mL/min (A) (by C-G formula based on SCr of 6.17 mg/dL (H)). Recent Labs  Lab 09/07/22 1802 09/07/22 1840 09/07/22 2236  WBC 7.4  --   --   LATICACIDVEN  --  1.0 1.6    Liver Function Tests: Recent Labs  Lab 09/07/22 1802  AST 33  ALT 43  ALKPHOS 233*  BILITOT 0.8  PROT 6.4*  ALBUMIN 3.3*   Recent Labs  Lab 09/07/22 2237  LIPASE 21   No results for input(s): "AMMONIA" in the last 168 hours.  ABG    Component Value Date/Time   PHART 7.375 04/24/2021 0642   PCO2ART 38.1 04/24/2021 0642   PO2ART 98.7 04/24/2021 0642   HCO3 28.9 (H) 09/07/2022 1823   TCO2 28 07/06/2022 0834   ACIDBASEDEF 25.6 (H) 11/06/2021 0933   O2SAT 89.8 09/07/2022 1823     Coagulation Profile: No results for input(s): "INR", "PROTIME" in the last 168 hours.  Cardiac Enzymes: No results for input(s): "CKTOTAL", "CKMB", "CKMBINDEX", "TROPONINI" in the last 168 hours.  HbA1C: Hemoglobin A1C  Date/Time Value Ref Range Status  03/15/2022 11:43 AM 8.5 (A) 4.0 - 5.6 % Final  11/11/2013 04:31 AM 14.6 (H) 4.2 - 6.3 % Final    Comment:    The American Diabetes Association recommends that a primary goal of therapy should be <7% and that physicians should reevaluate the treatment regimen in patients with HbA1c values  consistently >8%.   09/12/2011 02:53 AM SEE COMMENT 4.2 - 6.3 % Final    Comment:    The American Diabetes Association recommends that a primary goal of therapy should be <7% and that physicians should reevaluate the treatment regimen in patients with HbA1c values consistently >8%. HGB A1C - Unable to perform testing at [facility] due  - to interfering substance. HGB A1C - H -- 16.8 %  - Reference Range: 4.8-5.6  - ----------------------------------------  - Increased risk for diabetes: 5.7-6.4  - Diabetes: >6.4  - Glycemic control for adults with  - .[name]... diabetes: <7.0  - ----------------------------------------  - PERFORMED BY Sammu[facility] Hines SPEC.NO.: [phone]  - [address] [address]  - Mila Homer, MD  - [phone]    HbA1c, POC (controlled diabetic range)  Date/Time Value Ref Range Status  06/04/2018 09:29 AM   Final    Comment:    >15   Hgb A1c MFr Bld  Date/Time Value Ref Range Status  11/09/2021 06:19 AM 9.1 (H) 4.8 - 5.6 % Final    Comment:    (NOTE) Pre diabetes:          5.7%-6.4%  Diabetes:              >6.4%  Glycemic control for   <7.0% adults with diabetes   07/26/2021 04:45 PM 8.0 (H) 4.8 - 5.6 % Final    Comment:    (NOTE) Pre diabetes:          5.7%-6.4%  Diabetes:              >6.4%  Glycemic control for   <7.0% adults with diabetes     CBG: Recent Labs  Lab 09/07/22 2050 09/07/22 2205 09/07/22 2217 09/07/22 2325 09/07/22 2350  GLUCAP 122* <10* 138* 47* 170*    Review of Systems:   Unable to be obtained secondary to the patient's altered mental status   Past Medical History  She,  has a past medical history of Abscess, gluteal, right (08/24/2013), AKI (acute kidney injury) (HCC) (07/26/2014), Anemia (02/19/2012), Bartholin's gland abscess (09/19/2013), Bipolar disorder (HCC), BV (bacterial vaginosis) (11/24/2015), Depression, Diabetes mellitus type I (HCC) (2001), Diarrhea (05/30/2016), DKA  (diabetic ketoacidoses) (08/19/2013), Gonorrhea (08/2011), History of trichomoniasis (05/31/2016), Hyperlipidemia (03/28/2016), Hypertension, and Sepsis (HCC) (09/19/2013).   Surgical History    Past Surgical History:  Procedure Laterality Date  . AV FISTULA PLACEMENT Right 07/06/2022   Procedure: ARTERIOVENOUS GRAFT CREATION;  Surgeon: Cephus Shelling, MD;  Location: [facility] OR;  Service: Vascular;  Laterality: Right;  . CESAREAN SECTION N/A 10/05/2019   Procedure: CESAREAN SECTION;  Surgeon: Ceredo Bing, MD;  Location: [facility] LD ORS;  Service: Obstetrics;  Laterality: N/A;  . INCISION AND DRAINAGE ABSCESS Left 09/28/2019   Procedure: INCISION AND DRAINAGE VULVAR ABCESS;  Surgeon: Tilda Burrow, MD;  Location: [facility] OR;  Service: Gynecology;  Laterality: Left;  . INCISION AND DRAINAGE PERIRECTAL ABSCESS Right 08/18/2013   Procedure: IRRIGATION AND DEBRIDEMENT GLUTEAL ABSCESS;  Surgeon: Axel Filler, MD;  Location: [facility] OR;  Service: General;  Laterality: Right;  . INCISION AND DRAINAGE PERIRECTAL ABSCESS Right 09/19/2013   Procedure: IRRIGATION AND DEBRIDEMENT RIGHT GLUTEAL AND LABIAL ABSCESSES;  Surgeon: Axel Filler, MD;  Location: [facility] OR;  Service: General;  Laterality: Right;  . INCISION AND DRAINAGE PERIRECTAL ABSCESS Right 09/24/2013   Procedure: IRRIGATION AND DEBRIDEMENT PERIRECTAL ABSCESS;  Surgeon: Cherylynn Ridges, MD;  Location: [facility] OR;  Service: General;  Laterality: Right;     Social History   reports that she has never smoked. She has never been exposed to tobacco smoke. She has never used smokeless tobacco. She reports that she does not currently use alcohol. She reports that she does not use drugs.   Family History   Her family history includes Asthma in her mother; Carpal tunnel syndrome in her mother; Diabetes in her paternal grandmother; Gout in her father. There is no history of Anesthesia problems.   Allergies Allergies  Allergen Reactions  . Cephalexin Anaphylaxis    Has  gotten ceftriaxone in the past   . Penicillins Hives and Rash    Has patient had a PCN reaction causing immediate rash, facial/tongue/throat swelling, SOB or lightheadedness with hypotension: Yes Has patient had a PCN reaction causing severe rash involving mucus membranes or skin necrosis: No Has patient had a PCN reaction that required hospitalization: Yes Has patient had a PCN reaction occurring within the last 10 years: No Spoke with pt - childhood hives told  by mom, tried no pcns since, doesn't remember reaction herself   . Benadryl [Diphenhydramine] Itching  . Doxycycline Itching     Home Medications  Prior to Admission medications   Medication Sig Start Date End Date Taking? Authorizing Provider  acetaminophen (TYLENOL) 500 MG tablet Take 1,000 mg by mouth every 6 (six) hours as needed for headache (pain).    [provider]  albuterol (VENTOLIN HFA) 108 (90 Base) MCG/ACT inhaler Inhale 2 puffs into the lungs every 4 (four) hours as needed for wheezing or shortness of breath. 08/09/20   [provider]  amLODipine (NORVASC) 10 MG tablet Take 1 tablet (10 mg total) by mouth daily. 11/11/21 07/03/22  Lorin Glass, MD  blood glucose meter kit and supplies KIT Dispense based on patient and insurance preference. Use up to four times daily as directed. (FOR ICD-9 250.00, 250.01). 05/25/17   Jacalyn Lefevre, MD  Blood Pressure Monitoring (BLOOD PRESSURE KIT) DEVI 1 Device by Does not apply route as needed. 05/06/19   Anyanwu, Jethro Bastos, MD  carvedilol (COREG) 25 MG tablet Take 1 tablet (25 mg total) by mouth 2 (two) times daily. 11/11/21 07/03/22  Lorin Glass, MD  Continuous Blood Gluc Sensor (DEXCOM G6 SENSOR) MISC Change sensors every 10 days 07/02/22   Shamleffer, Konrad Dolores, MD  Continuous Blood Gluc Transmit (DEXCOM G6 TRANSMITTER) MISC Inject 1 Device into the skin as directed. Use to check blood sugar daily 07/02/22   Shamleffer, Konrad Dolores, MD  Continuous Glucose Sensor  (DEXCOM G7 SENSOR) MISC Change sensors every 10 days 09/07/22   Shamleffer, Konrad Dolores, MD  ferrous sulfate 324 (65 Fe) MG TBEC Take 324 mg by mouth daily. 04/11/22   [provider]  furosemide (LASIX) 20 MG tablet Take 20 mg by mouth daily.    [provider]  insulin aspart (NOVOLOG) 100 UNIT/ML injection Max daily 50 units .Uses insulin pump 09/07/22   Shamleffer, Konrad Dolores, MD  Insulin Disposable Pump (OMNIPOD 5 G6 INTRO, GEN 5,) KIT Change pods every 3 days 01/22/22   Shamleffer, Konrad Dolores, MD  Insulin Disposable Pump (OMNIPOD 5 G6 POD, GEN 5,) MISC CHANGE EVERY 3 DAYS 04/13/22   Shamleffer, Konrad Dolores, MD  Insulin Pen Needle 32G X 4 MM MISC Use as directed 10/07/20   Ghimire, Werner Lean, MD  lamoTRIgine (LAMICTAL) 25 MG tablet Take 25 mg by mouth daily.    [provider]  LORazepam (ATIVAN) 0.5 MG tablet Take by mouth. 07/13/22   [provider]  lurasidone (LATUDA) 40 MG TABS tablet Take 40 mg by mouth daily with breakfast.    [provider]  oxyCODONE-acetaminophen (PERCOCET/ROXICET) 5-325 MG tablet Take 1 tablet by mouth every 4 (four) hours as needed for severe pain. 07/10/22   Rhyne, Ames Coupe, PA-C  rosuvastatin (CRESTOR) 40 MG tablet Take 40 mg by mouth daily. 05/22/22   [provider]  sevelamer carbonate (RENVELA) 800 MG tablet Take 1,600 mg by mouth 3 (three) times daily with meals. 05/22/22 05/22/23  [provider]  SUMAtriptan (IMITREX) 50 MG tablet Take 50 mg by mouth every 2 (two) hours as needed for migraine or headache. 06/20/22   [provider]  torsemide (DEMADEX) 20 MG tablet Take 20 mg by mouth daily. 05/25/22   [provider]  Vitamin D, Ergocalciferol, (DRISDOL) 1.25 MG (50000 UNIT) CAPS capsule Take 50,000 Units by mouth every 7 (seven) days. 08/17/21   [provider]  Scheduled Meds: . Chlorhexidine Gluconate Cloth  6 each Topical Daily  . heparin lock flush       . mouth rinse  15 mL Mouth Rinse Q2H   Continuous Infusions: . sodium chloride 75 mL/hr (09/07/22 2252)  . ceFEPime (MAXIPIME) IV    . dextrose 50 mL/hr at 09/07/22 2251  . levETIRAcetam    . metronidazole    . potassium chloride 10 mEq (09/07/22 2245)   PRN Meds:.docusate sodium, heparin lock flush, LORazepam, mouth rinse, polyethylene glycol  Active Hospital Problem list     Assessment & Plan:   #Acute toxic metabolic encephalopathy in the setting of Severe Hypoglycemia and Hypoglycemic Induced Seizures -CTH negative for acute intracranial abnormality -Obtain EEG for prognostication once stable -We will give Keppra load 1g and continue maintenance 500 mg twice daily -keep sedation light as able though may be limited in setting of his myoclonus/possible sz activity -Seizure precautions -Neuro consult   #Severe Hypoglycemia #Type I DM with hx of recent DKA present with  altered mental status due to severe hypoglycemia and seizures Presentation likely due to non compliance with insulin regimen. She was placed on Insulin pump with basal rate @midnight  -11 am 0.5 unit/hr and 11 am-midnight  0.6 units/hour  -Hypoglycemia order set in place  -Glucose: q1h  -Lab monitoring: q2-4h BMP+Phosphorus+pH (ABG/VBG) to assess severity of acidemia -Start D10 infusion x 4 hours for now -Diabetes coordinator consult  #Acute Hypoxic Respiratory Failure #Possible aspiration Event due to multiple episodes of vomiting with decreased LOC #Volume Overload - - pulmonary hygiene - Follow chest x-ray, ABG prn.  - BCx, resp cx, unasyn for 5d course    #ESRD #Hypokalemia Dialysis T/T/Sat. S/p right upper extremity AV graft 07/06/22 at [place] not ready for use until 5/3. Currently undergoing dialysis through right IJ. -Replete K with 50% dose -BMP, mg, phos daily  -Replace electrolytes as indicated -Nephro consult, plan to dialyze tomorrow as scheduled  #Acute on Chronic HFrEF:   #HTN #HLD Echo( 2022) LVEF 40%, recovered (2023) however, most recent Echo on 09/05/22 showed severely decreased, LVEF estimated at 35%.  BNP: possibly secondary to heart failure versus worsening of her ESRD? -Continue losartan and carvedilol, no spironolactone or SGLT2 due to advanced CKD -rosuvastatin 40 mg daily. Continue. -Continue Torsemide to 80 mg twice daily on no dialysis days -Continue dialysis Tuesday/Thursday/Saturday   -Acute Hypoxic Hypercapnic Respiratory Failure secondary to Acute Decompensated HFpEF & presumed Obstructive Sleep Apnea (previously scheduled for sleep study 09/10/19) -COPD without evidence of acute exacerbation -Supplemental O2 as needed to maintain O2 saturations 88 to 92% -BiPAP, wean as tolerated -High risk for intubation -Follow intermittent ABG and chest x-ray as needed -Repeat CXR on 5/12 with stable vascular congestion; no new focal infiltrate or hyperinflation noted; currently no wheezing noted upon auscultation -IV Lasix as blood pressure and renal function permits; currently on Lasix 40 mg IV BID -As needed bronchodilators -Encourage smoking cessation   -Acute on chronic HFpEF (last known EF 60 to 65%) -Hypertension Hx: CAD status post CABG in March 2021, Post-op A-fib, HLD  -Continuous cardiac monitoring -Maintain MAP greater than 65 -IV Lasix as blood pressure and renal function permits; currently on Lasix 40 mg IV BID -Continue Norvasc, metoprolol. -Cardiology following, appreciate input -Repeat 2D Echocardiogram   -Acute Metabolic Encephalopathy due to Hypercapnia -Provide supportive care -BiPAP to treat Hypercapnia   -CKD Stage III -Monitor I&O's / urinary output -Follow BMP -Ensure adequate renal perfusion -Avoid nephrotoxic agents as able -Replace electrolytes as indicated   -Diabetes mellitus -  CBGs -Sliding scale insulin -Follow ICU hyper/hypoglycemia protocol -Hold home Metformin & Amaryl       Best practice:  Diet:   {WUJW:11914} Pain/Anxiety/Delirium protocol (if indicated): {Pain/Anxiety/Delirium:26941} VAP protocol (if indicated): {VAP:29640} DVT prophylaxis: {DVT Prophylaxis:26933} GI prophylaxis: {GI:26934} Glucose control:  {Glucose Control:26935} Central venous access:  {Central Venous Access:26936} Arterial line:  {Central Venous Access:26936} Foley:  {Central Venous Access:26936} Mobility:  {Mobility:26937}  PT consulted: {PT Consult:26938} Last date of multidisciplinary goals of care discussion [***] Code Status:  {Code Status:26939} Disposition: ***   = Goals of Care = Code Status Order: @CODE @   Primary Emergency Contact: [place],Latisha Wishes to pursue full aggressive treatment and intervention options, including CPR and intubation, but goals of care will be addressed on going with family if that should become necessary.  Critical care time: 45 minutes        Webb Silversmith DNP, CCRN, FNP-C, AGACNP-BC Acute Care & Family Nurse Practitioner [facility] PCCM on call pager [phone]

## 2022-09-08 NOTE — Progress Notes (Signed)
Referring Provider: No ref. provider found Primary Care Physician:  [facility] Primary Nephrologist:  Dr. Thedore Mins  Reason for Consultation: ESRD  HPI: 32 year old female with history of type 1 diabetes, hypertension, hyperlipidemia, end-stage renal disease on hemodialysis via right permacath now admitted to the hospital with history of seizure-like activity and unresponsiveness.  She was found to be hypoglycemic on admission at 50.  She also had severe nausea and vomiting on admission.  She had a CT scan of the abdomen and pelvis which was negative.  She has been on a TTS schedule for dialysis.  Past Medical History:  Diagnosis Date   Abscess, gluteal, right 08/24/2013   Anemia 02/19/2012   Bartholin's gland abscess 09/19/2013   Bipolar disorder (HCC)    BV (bacterial vaginosis) 11/24/2015   Depression    Diabetes mellitus type I (HCC) 2001   Diagnosed at age 6 ; Type I   Diarrhea 05/30/2016   DKA (diabetic ketoacidoses) 08/19/2013   Also in 2018   ESRD (end stage renal disease) (HCC)    Gonorrhea 08/2011   Treated in 09/2011   HFrEF (heart failure with reduced ejection fraction) (HCC)    a. 10/2021 Echo: EF 55%; b. 07/2022 MV: No ischemia. EF 31%; c. 08/2022 Echo: EF 35%, mildly dil RV, sev TR.   History of trichomoniasis 05/31/2016   Hyperlipidemia 03/28/2016   Hypertension    Sepsis (HCC) 09/19/2013    Past Surgical History:  Procedure Laterality Date   AV FISTULA PLACEMENT Right 07/06/2022   Procedure: ARTERIOVENOUS GRAFT CREATION;  Surgeon: Cephus Shelling, MD;  Location: [facility] OR;  Service: Vascular;  Laterality: Right;   CESAREAN SECTION N/A 10/05/2019   Procedure: CESAREAN SECTION;  Surgeon: Sheldon Bing, MD;  Location: [facility] LD ORS;  Service: Obstetrics;  Laterality: N/A;   INCISION AND DRAINAGE ABSCESS Left 09/28/2019   Procedure: INCISION AND DRAINAGE VULVAR ABCESS;  Surgeon: Tilda Burrow, MD;  Location: [facility] OR;  Service: Gynecology;  Laterality: Left;   INCISION  AND DRAINAGE PERIRECTAL ABSCESS Right 08/18/2013   Procedure: IRRIGATION AND DEBRIDEMENT GLUTEAL ABSCESS;  Surgeon: Axel Filler, MD;  Location: [facility] OR;  Service: General;  Laterality: Right;   INCISION AND DRAINAGE PERIRECTAL ABSCESS Right 09/19/2013   Procedure: IRRIGATION AND DEBRIDEMENT RIGHT GLUTEAL AND LABIAL ABSCESSES;  Surgeon: Axel Filler, MD;  Location: [facility] OR;  Service: General;  Laterality: Right;   INCISION AND DRAINAGE PERIRECTAL ABSCESS Right 09/24/2013   Procedure: IRRIGATION AND DEBRIDEMENT PERIRECTAL ABSCESS;  Surgeon: Cherylynn Ridges, MD;  Location: [facility] OR;  Service: General;  Laterality: Right;    Prior to Admission medications   Medication Sig Start Date End Date Taking? Authorizing Provider  acetaminophen (TYLENOL) 500 MG tablet Take 1,000 mg by mouth every 6 (six) hours as needed for mild pain or moderate pain.   Yes [provider]  blood glucose meter kit and supplies KIT Dispense based on patient and insurance preference. Use up to four times daily as directed. (FOR ICD-9 250.00, 250.01). 05/25/17  Yes Jacalyn Lefevre, MD  Blood Pressure Monitoring (BLOOD PRESSURE KIT) DEVI 1 Device by Does not apply route as needed. 05/06/19  Yes Anyanwu, Jethro Bastos, MD  carvedilol (COREG) 25 MG tablet Take 1 tablet (25 mg total) by mouth 2 (two) times daily. Patient taking differently: Take 12.5 mg by mouth 2 (two) times daily. 11/11/21  Yes Dahal, Melina Schools, MD  Continuous Blood Gluc Sensor (DEXCOM G6 SENSOR) MISC Change sensors every 10 days 07/02/22  Yes Shamleffer,  Konrad Dolores, MD  Continuous Blood Gluc Transmit (DEXCOM G6 TRANSMITTER) MISC Inject 1 Device into the skin as directed. Use to check blood sugar daily 07/02/22  Yes Shamleffer, Konrad Dolores, MD  Continuous Glucose Sensor (DEXCOM G7 SENSOR) MISC Change sensors every 10 days 09/07/22  Yes Shamleffer, Konrad Dolores, MD  furosemide (LASIX) 80 MG tablet Take 80 mg by mouth 2 (two) times daily.   Yes [provider]   insulin aspart (NOVOLOG) 100 UNIT/ML injection Max daily 50 units .Uses insulin pump 09/07/22  Yes Shamleffer, Konrad Dolores, MD  Insulin Disposable Pump (OMNIPOD 5 G6 INTRO, GEN 5,) KIT Change pods every 3 days 01/22/22  Yes Shamleffer, Konrad Dolores, MD  Insulin Disposable Pump (OMNIPOD 5 G6 POD, GEN 5,) MISC CHANGE EVERY 3 DAYS 04/13/22  Yes Shamleffer, Konrad Dolores, MD  Insulin Pen Needle 32G X 4 MM MISC Use as directed 10/07/20  Yes Ghimire, Werner Lean, MD  lamoTRIgine (LAMICTAL) 100 MG tablet Take 100 mg by mouth daily.   Yes [provider]  LORazepam (ATIVAN) 0.5 MG tablet Take 0.5 mg by mouth daily as needed for anxiety.   Yes [provider]  losartan (COZAAR) 50 MG tablet Take 50 mg by mouth daily.   Yes [provider]  oxyCODONE-acetaminophen (PERCOCET/ROXICET) 5-325 MG tablet Take 1 tablet by mouth every 4 (four) hours as needed for severe pain. 07/10/22  Yes Rhyne, Samantha J, PA-C  rosuvastatin (CRESTOR) 40 MG tablet Take 40 mg by mouth daily. 05/22/22  Yes [provider]  sevelamer carbonate (RENVELA) 800 MG tablet Take 2,400 mg by mouth 3 (three) times daily with meals.   Yes [provider]  SUMAtriptan (IMITREX) 50 MG tablet Take 50 mg by mouth every 2 (two) hours as needed for migraine or headache. 06/20/22  Yes [provider]  amLODipine (NORVASC) 10 MG tablet Take 1 tablet (10 mg total) by mouth daily. 11/11/21 07/03/22  Lorin Glass, MD    Current Facility-Administered Medications  Medication Dose Route Frequency Provider Last Rate Last Admin   ceFEPIme (MAXIPIME) 1 g in sodium chloride 0.9 % 100 mL IVPB  1 g Intravenous Q24H Raechel Chute, MD   Stopped at 09/08/22 0205   Chlorhexidine Gluconate Cloth 2 % PADS 6 each  6 each Topical Daily Jimmye Norman, NP   6 each at 09/08/22 0849   dextrose 10 % infusion   Intravenous Continuous Jimmye Norman, NP   Stopped at 09/08/22 0330   docusate sodium  (COLACE) capsule 100 mg  100 mg Oral BID PRN Jimmye Norman, NP       heparin injection 5,000 Units  5,000 Units Subcutaneous Q8H Ezequiel Essex, NP       insulin aspart (novoLOG) injection 0-5 Units  0-5 Units Subcutaneous QHS Ezequiel Essex, NP       insulin aspart (novoLOG) injection 0-6 Units  0-6 Units Subcutaneous TID WC Ezequiel Essex, NP   6 Units at 09/08/22 1202   metroNIDAZOLE (FLAGYL) IVPB 500 mg  500 mg Intravenous Q12H Jimmye Norman, NP 100 mL/hr at 09/08/22 1206 500 mg at 09/08/22 1206   Oral care mouth rinse  15 mL Mouth Rinse 4 times per day Raechel Chute, MD   15 mL at 09/08/22 1202   Oral care mouth rinse  15 mL Mouth Rinse PRN Dgayli, Lianne Bushy, MD       polyethylene glycol (MIRALAX / GLYCOLAX) packet 17 g  17 g Oral Daily PRN Ouma,  Hubbard Hartshorn, NP       vancomycin (VANCOCIN) capsule 125 mg  125 mg Oral Daily Raechel Chute, MD       [START ON 09/11/2022] vancomycin (VANCOREADY) IVPB 750 mg/150 mL  750 mg Intravenous Q T,Th,Sa-HD Lowella Bandy, RPH        Allergies as of 09/07/2022 - Reviewed 09/07/2022  Allergen Reaction Noted   Cephalexin Anaphylaxis 06/28/2020   Penicillins Hives and Rash 11/11/2010   Benadryl [diphenhydramine] Itching 05/30/2016   Doxycycline Itching 12/10/2017    Family History  Problem Relation Age of Onset   Asthma Mother    Carpal tunnel syndrome Mother    Gout Father    Diabetes Paternal Grandmother    Anesthesia problems Neg Hx     Social History   Socioeconomic History   Marital status: Single    Spouse name: Not on file   Number of children: 0   Years of education: 11th grade   Highest education level: Not on file  Occupational History   Occupation: unemployed    Comment: has never worked  Tobacco Use   Smoking status: Never    Passive exposure: Never   Smokeless tobacco: Never  Vaping Use   Vaping Use: Never used  Substance and Sexual Activity   Alcohol use: Not Currently   Drug use: No   Sexual  activity: Yes    Birth control/protection: None  Other Topics Concern   Not on file  Social History Narrative   Patient lives in [place] mother lives in [place].  Unemployed.  Previously worked for a Customer service manager.  Completed 11 grade working on [employer]. Patient 3 brothers    Social Determinants of Health   Financial Resource Strain: High Risk (08/24/2021)   Overall Financial Resource Strain (CARDIA)    Difficulty of Paying Living Expenses: Very hard  Food Insecurity: Patient Declined (09/08/2022)   Hunger Vital Sign    Worried About Running Out of Food in the Last Year: Patient declined    Ran Out of Food in the Last Year: Patient declined  Transportation Needs: Patient Declined (09/08/2022)   PRAPARE - Administrator, Civil Service (Medical): Patient declined    Lack of Transportation (Non-Medical): Patient declined  Physical Activity: Not on file  Stress: Not on file  Social Connections: Not on file  Intimate Partner Violence: Patient Declined (09/08/2022)   Humiliation, Afraid, Rape, and Kick questionnaire    Fear of Current or Ex-Partner: Patient declined    Emotionally Abused: Patient declined    Physically Abused: Patient declined    Sexually Abused: Patient declined    Physical Exam: Vital signs in last 24 hours: Temp:  [97.5 F (36.4 C)-102.3 F (39.1 C)] 98.9 F (37.2 C) (05/11 1100) Pulse Rate:  [66-113] 113 (05/11 1100) Resp:  [0-34] 20 (05/11 1100) BP: (101-172)/(49-113) 103/51 (05/11 1100) SpO2:  [87 %-99 %] 99 % (05/11 1100) Weight:  [59.3 kg] 59.3 kg (05/11 0330) Last BM Date : 09/08/22 General:   Alert,  Well-developed, well-nourished, pleasant and cooperative in NAD Head:  Normocephalic and atraumatic. Eyes:  Sclera clear, no icterus.   Conjunctiva pink. Ears:  Normal auditory acuity. Nose:  No deformity, discharge,  or lesions. Lungs:  Clear throughout to auscultation.   No wheezes, crackles, or rhonchi. No acute distress. Heart:   Regular rate and rhythm; no murmurs, clicks, rubs,  or gallops. Abdomen:  Soft, nontender and nondistended. No masses, hepatosplenomegaly or hernias noted. Normal bowel sounds, without guarding,  and without rebound.   Extremities:  Without clubbing or edema.  Intake/Output from previous day: 05/10 0701 - 05/11 0700 In: 1214 [I.V.:392.3; IV Piggyback:821.7] Out: 0  Intake/Output this shift: Total I/O In: 653.9 [I.V.:355.3; IV Piggyback:298.6] Out: 0   Lab Results: Recent Labs    09/07/22 1802 09/08/22 0634  WBC 7.4 19.7*  HGB 12.9 12.4  HCT 43.1 41.3  PLT 175 176   BMET Recent Labs    09/07/22 1802 09/08/22 0634  NA 140 135  K 2.6* 3.5  CL 103 101  CO2 25 15*  GLUCOSE 77 330*  BUN 36* 41*  CREATININE 6.17* 6.99*  CALCIUM 8.4* 8.1*  PHOS  --  4.6   LFT Recent Labs    09/07/22 1802  PROT 6.4*  ALBUMIN 3.3*  AST 33  ALT 43  ALKPHOS 233*  BILITOT 0.8   PT/INR No results for input(s): "LABPROT", "INR" in the last 72 hours. Hepatitis Panel No results for input(s): "HEPBSAG", "HCVAB", "HEPAIGM", "HEPBIGM" in the last 72 hours.  Studies/Results: CT ABDOMEN PELVIS WO CONTRAST  Result Date: 09/07/2022 CLINICAL DATA:  Unresponsive EXAM: CT ABDOMEN AND PELVIS WITHOUT CONTRAST TECHNIQUE: Multidetector CT imaging of the abdomen and pelvis was performed following the standard protocol without IV contrast. RADIATION DOSE REDUCTION: This exam was performed according to the departmental dose-optimization program which includes automated exposure control, adjustment of the mA and/or kV according to patient size and/or use of iterative reconstruction technique. COMPARISON:  Chest x-ray 09/07/2022, CT 09/30/2019, 02/13/2016 FINDINGS: Lower chest: Lung bases demonstrate heterogeneous airspace disease in the left greater than right lower lobes. Cardiomegaly. Hepatobiliary: Distended gallbladder without calcified stone. No biliary dilatation. Pancreas: Unremarkable. No pancreatic  ductal dilatation or surrounding inflammatory changes. Spleen: Normal in size without focal abnormality. Adrenals/Urinary Tract: Adrenal glands are within normal limits. Kidneys show no hydronephrosis. The bladder is unremarkable Stomach/Bowel: The stomach is nonenlarged. Few air distended loops of small bowel in the central pelvis but no convincing obstruction or bowel wall thickening. Negative appendix Vascular/Lymphatic: Advanced vascular calcifications of the mesenteric and pelvic branch vessels. No suspicious lymph nodes. Reproductive: Uterus unremarkable.  No adnexal mass. Other: Negative for pelvic effusion or free air Musculoskeletal: No acute or suspicious osseous abnormality IMPRESSION: 1. No CT evidence for acute intra-abdominal or pelvic abnormality. 2. Heterogeneous airspace disease in the left greater than right lower lobes, suspect for pneumonia. Cardiomegaly. Electronically Signed   By: Jasmine Pang M.D.   On: 09/07/2022 22:27   CT Head Wo Contrast  Result Date: 09/07/2022 CLINICAL DATA:  Altered mental status EXAM: CT HEAD WITHOUT CONTRAST TECHNIQUE: Contiguous axial images were obtained from the base of the skull through the vertex without intravenous contrast. RADIATION DOSE REDUCTION: This exam was performed according to the departmental dose-optimization program which includes automated exposure control, adjustment of the mA and/or kV according to patient size and/or use of iterative reconstruction technique. COMPARISON:  None Available. FINDINGS: Brain: There is no mass, hemorrhage or extra-axial collection. The size and configuration of the ventricles and extra-axial CSF spaces are normal. The brain parenchyma is normal, without acute or chronic infarction. Vascular: No abnormal hyperdensity of the major intracranial arteries or dural venous sinuses. No intracranial atherosclerosis. Skull: The visualized skull base, calvarium and extracranial soft tissues are normal. Sinuses/Orbits: No  fluid levels or advanced mucosal thickening of the visualized paranasal sinuses. No mastoid or middle ear effusion. The orbits are normal. IMPRESSION: Normal head CT. Electronically Signed   By: Chrisandra Netters.D.  On: 09/07/2022 22:22   DG Chest Port 1 View  Result Date: 09/07/2022 CLINICAL DATA:  Vomiting EXAM: PORTABLE CHEST 1 VIEW COMPARISON:  11/06/2021 FINDINGS: Right-sided central venous catheter with tips over the SVC and right atrium. Cardiomegaly with suspected trace pleural effusions. Mild vascular congestion and diffuse interstitial opacities suspicious for mild edema. Minimal atelectasis, pneumonia or aspiration left base. No pneumothorax. Hair artifact over the upper chest. IMPRESSION: Cardiomegaly with mild vascular congestion and suspected mild edema. Minimal atelectasis, pneumonia or aspiration at the left base. Trace pleural effusions. Electronically Signed   By: Jasmine Pang M.D.   On: 09/07/2022 19:04    Assessment/Plan:  32 year old female with history of type 1 diabetes, hypertension, hyperlipidemia, end-stage renal disease on hemodialysis via right permacath now admitted to the hospital with history of seizure-like activity and unresponsiveness.  She was found to be hypoglycemic on admission at 50.  She also had severe nausea and vomiting on admission.  She had a CT scan of the abdomen and pelvis which was negative.  She has been on a TTS schedule for dialysis.  ESRD: Will perform dialysis via right permacath at bedside today.  Will also attempt to remove 3 L of fluid as tolerated.  ANEMIA: Will monitor closely and follow the anemia protocol.  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  HTN/VOL: Blood pressure is well-controlled.  ACCESS shows right permacath.  Sepsis: Patient was started on IV vancomycin, metronidazole and also cefepime.   Consent was obtained from patient for dialysis.  Dialysis orders written. Notified dialysis RN.   LOS: 1 Lorain Childes, MD [facility] @TODAY @12 :10 PM

## 2022-09-08 NOTE — Progress Notes (Signed)
RN went to Pt room to do bedside tx   Informed consent signed and in chart.    TX duration:1hr  Patient realized as hemodialysis started that D/T insulin started ,she would not be able to have any food. She said she was leaving. Myself and ICU RN tried to get her to stay for Dialysis tx. She agreed for a little while and became angry again and refused to any tx, When her family arrived to get her I rinsed her back. I explained to her that no fluid had been removed. She said she understood.Dr. Suezanne Jacquet aware Hand-off given to patient's nurse. [name] RN   Access used: R chest PC Access issues: None   Total UF removed: 300 Medication(s) given:  Heparin dwells in PC Post HD VS: Hypotensive at start of tx, then stable Post HD weight: 60.1kg    [facility]

## 2022-09-08 NOTE — Progress Notes (Signed)
NAME:  Ashley Freeman, MRN:  [id number], DOB:  [birth year], LOS: 1 ADMISSION DATE:  09/07/2022, CHIEF COMPLAINT:  AMS, hypoglycemia   History of Present Illness:   32 y.o  female with significant PMH of T1DM on Insulin pump, HFrEF, ESRD Dialysis T/T/Sat  S/p right upper extremity AV graft 07/06/22 Currently undergoing dialysis through right IJ, HTN, and HLD  who presented to the ED with chief complaints of unresponsiveness.    Per ED reports, EMS was called for seizure like activity and unresponsiveness. On EMS arrival, patient had a blood glucose of 48, for which she received dextrose and glucagon with improvement of blood glucose but no change in mentation. On review of her chart, patient was recently admitted at [facility] with DKA. Per [facility] notes, patient stated she was using subq insulin instead of her omnipod. She was advised to use her insulin pump by her endocrinologist but from chart messages it appears she was having difficulties using her insulin pump.   ED Course: Initial vital signs showed HR of beats/minute, BP mm Hg, the RR 30 breaths/minute, and the oxygen saturation % on and a temperature of 98.73F (36.9C). Patient was lethargic, moaned intermittently, and responded with one-word answers; symmetric movement in the arms and legs was observed. Patient was actively vomiting and was treated with Droperidol. Due to high risk for decompensation, PCCM consulted for admission.   Pertinent Labs/Diagnostics Findings: Na+/ K+: 140/2.6 Glucose: 77 BUN/Cr.:36/6.17 WBC:7.5 BNP: 2669.4 VBG:pO2 60; pCO2 50; pH 7.37;  HCO3 28.9, %O2 Sat 89.8.    On arrival to the unit, blood glucose was obtained and showed unreadable on the glucometer. Patient developed tonic-clonic seizures and decorticate posturing prior to receiving dextrose. She received Ativan and was  about to be intubated for airway protection when she woke up and was able to follow commands. Initial CT head without contrast was  negative.  Pertinent  Medical History  T1DM on Insulin pump, HFrEF, ESRD Dialysis T/T/Sat  S/p right upper extremity AV graft 07/06/22 Currently undergoing dialysis through right IJ, HTN, and HLD  Significant Hospital Events: Including procedures, antibiotic start and stop dates in addition to other pertinent events   5/10:Admit to ICU with toxic metabolic encephalopathy int he setting of severe hypoglycemia and Hypoglycemia Induced Seizures.   Interim History / Subjective:  Patient sleepy this morning. Denies any pain. Denies cough, denies dysuria, denies GU/GI symptoms. Reports her step mother has pneumonia at home.  Objective   Blood pressure (!) 114/49, pulse (!) 108, temperature (!) 100.4 F (38 C), temperature source Oral, resp. rate (!) 27, height 5\' 3"  (1.6 m), weight 59.3 kg, SpO2 97 %, unknown if currently breastfeeding.        Intake/Output Summary (Last 24 hours) at 09/08/2022 0843 Last data filed at 09/08/2022 0819 Gross per 24 hour  Intake 1655.67 ml  Output 0 ml  Net 1655.67 ml   Filed Weights   09/07/22 2241 09/08/22 0330  Weight: 59.3 kg 59.3 kg    Examination: Physical Exam Constitutional:      General: She is not in acute distress.    Appearance: Normal appearance. She is ill-appearing.  HENT:     Mouth/Throat:     Mouth: Mucous membranes are moist.  Cardiovascular:     Rate and Rhythm: Normal rate and regular rhythm.     Pulses: Normal pulses.     Heart sounds: Normal heart sounds.  Pulmonary:     Effort: Pulmonary effort is normal.  Breath sounds: Normal breath sounds.  Abdominal:     Palpations: Abdomen is soft.  Neurological:     General: No focal deficit present.     Mental Status: She is alert.      Assessment & Plan:   Neurology #Toxic Metabolic Encephalpathy #Hypoglycemia induced seizure  Home medications: lamotrigine 100 mg daily  Encephalopathy and seizure in the setting of severe symptomatic hypoglycemia. Controlled with  dextrose infusions with improvement. She did receive levetiracetam which I am going to discontinue at the moment. Will obtain a spot EEG once able.  -spot EEG -restart lamotrigine tomorrow  Cardiovascular #HFrEF  Home medications: amlodipine 10 mg qd, carvedilol 12.5 mg bid, losartan 50 mg daily, rosuvastatin 40 mg daily, torsemide 80 mg bid on non-dialysis days  Does have a recent TTE on 09/05/2022 at [facility] (care everywhere) showing newly depressed LVEF. Maintained on carvedilol and losartan at home. No report of respiratory symptoms, but does have a significantly elevated BNP, and has infiltrates on her chest CT. Infectious workup ensued, but differential for pulmonary infiltrates also includes pulmonary edema. Will consult cardiology to guide management of newly diagnosed HFrEF and assess for need of RHC/LHC.  -appreciate input from cardiology  Pulmonary #Pulmonary Infiltrates #Acute Hypoxic Respiratory Failure  On 2 liters nasal cannula. CXR with signs of pulmonary edema, abdominal CT (lung windows) with basilar infiltrates, both consistent with pulmonary edema. Differential also includes infectious etiologies.   -titrate [place] to SpO2 > 92%  Gastrointestinal  No active issues, LFT's within normal.  Renal #ESRD  History of ESRD on HD, last session was yesterday. Comes in now with hypoglycemia. Will stop IV fluids, monitor electrolytes closely, and consult nephrology for inpatient HD needs.  Endocrine #T1DM #Hypoglycemia  Known to have T1DM, with multiple admissions for DKA, most recently on 08/22/2022 to [facility]. During that admission, her lantus was increased to 22 units daily. She then followed with her endocrinologist and was started on a pump. Comes in now with symptomatic hypoglycemia, patient is reporting to attached the pump while it was priming. Holding off on insulin initiation at this moment given severe hypoglycemia, and will continue with q1hour glucose  checks.  Hem/Onc  Heparin for DVT prophylaxis  ID #History of C. Diff  Presents with severe hypoglycemia, and does have a fever. Cultures sent, broad spectrum antibiotics initiated. Given she has a dialysis catheter, will add IV vancomycin for MRSA coverage. Will also add PO vancomycin to prevent C. Diff recurrence given history of C. Diff last year.  -follow up blood cultures -respiratory viral panel ordered   Best Practice (right click and "Reselect all SmartList Selections" daily)   Diet/type: Regular consistency (see orders) DVT prophylaxis: prophylactic heparin  GI prophylaxis: N/A Lines: Dialysis Catheter Foley:  N/A Code Status:  full code Last date of multidisciplinary goals of care discussion [09/08/2022]  Labs   CBC: Recent Labs  Lab 09/07/22 1802 09/08/22 0634  WBC 7.4 19.7*  NEUTROABS 3.9  --   HGB 12.9 12.4  HCT 43.1 41.3  MCV 85.9 85.3  PLT 175 176    Basic Metabolic Panel: Recent Labs  Lab 09/07/22 1802 09/07/22 1909 09/08/22 0634  NA 140  --  135  K 2.6*  --  3.5  CL 103  --  101  CO2 25  --  15*  GLUCOSE 77  --  330*  BUN 36*  --  41*  CREATININE 6.17*  --  6.99*  CALCIUM 8.4*  --  8.1*  MG  --  2.8* 2.2  PHOS  --   --  4.6   GFR: Estimated Creatinine Clearance: 9.7 mL/min (A) (by C-G formula based on SCr of 6.99 mg/dL (H)). Recent Labs  Lab 09/07/22 1802 09/07/22 1840 09/07/22 2236 09/08/22 0634  WBC 7.4  --   --  19.7*  LATICACIDVEN  --  1.0 1.6  --     Liver Function Tests: Recent Labs  Lab 09/07/22 1802  AST 33  ALT 43  ALKPHOS 233*  BILITOT 0.8  PROT 6.4*  ALBUMIN 3.3*   Recent Labs  Lab 09/07/22 2237  LIPASE 21   No results for input(s): "AMMONIA" in the last 168 hours.  ABG    Component Value Date/Time   PHART 7.375 04/24/2021 0642   PCO2ART 38.1 04/24/2021 0642   PO2ART 98.7 04/24/2021 0642   HCO3 28.9 (H) 09/07/2022 1823   TCO2 28 07/06/2022 0834   ACIDBASEDEF 25.6 (H) 11/06/2021 0933   O2SAT 89.8  09/07/2022 1823     Coagulation Profile: No results for input(s): "INR", "PROTIME" in the last 168 hours.  Cardiac Enzymes: No results for input(s): "CKTOTAL", "CKMB", "CKMBINDEX", "TROPONINI" in the last 168 hours.  HbA1C: Hemoglobin A1C  Date/Time Value Ref Range Status  03/15/2022 11:43 AM 8.5 (A) 4.0 - 5.6 % Final  11/11/2013 04:31 AM 14.6 (H) 4.2 - 6.3 % Final    Comment:    The American Diabetes Association recommends that a primary goal of therapy should be <7% and that physicians should reevaluate the treatment regimen in patients with HbA1c values consistently >8%.   09/12/2011 02:53 AM SEE COMMENT 4.2 - 6.3 % Final    Comment:    The American Diabetes Association recommends that a primary goal of therapy should be <7% and that physicians should reevaluate the treatment regimen in patients with HbA1c values consistently >8%. HGB A1C - Unable to perform testing at [facility] due  - to interfering substance. HGB A1C - H -- 16.8 %  - Reference Range: 4.8-5.6  - ----------------------------------------  - Increased risk for diabetes: 5.7-6.4  - Diabetes: >6.4  - Glycemic control for adults with  - .[name]... diabetes: <7.0  - ----------------------------------------  - PERFORMED BY [facility] SPEC.NO.: [id number]  - [address] [id number]  - Mila Homer, MD  - [phone]    HbA1c, POC (controlled diabetic range)  Date/Time Value Ref Range Status  06/04/2018 09:29 AM   Final    Comment:    >15   Hgb A1c MFr Bld  Date/Time Value Ref Range Status  11/09/2021 06:19 AM 9.1 (H) 4.8 - 5.6 % Final    Comment:    (NOTE) Pre diabetes:          5.7%-6.4%  Diabetes:              >6.4%  Glycemic control for   <7.0% adults with diabetes   07/26/2021 04:45 PM 8.0 (H) 4.8 - 5.6 % Final    Comment:    (NOTE) Pre diabetes:          5.7%-6.4%  Diabetes:              >6.4%  Glycemic control for   <7.0% adults with diabetes      CBG: Recent Labs  Lab 09/08/22 0315 09/08/22 0322 09/08/22 0423 09/08/22 0521 09/08/22 0748  GLUCAP 252* 130* 279* 318* 337*   Past Medical History:  She,  has a past medical history of Abscess, gluteal, right (08/24/2013), AKI (  acute kidney injury) (HCC) (07/26/2014), Anemia (02/19/2012), Bartholin's gland abscess (09/19/2013), Bipolar disorder (HCC), BV (bacterial vaginosis) (11/24/2015), Depression, Diabetes mellitus type I (HCC) (2001), Diarrhea (05/30/2016), DKA (diabetic ketoacidoses) (08/19/2013), Gonorrhea (08/2011), History of trichomoniasis (05/31/2016), Hyperlipidemia (03/28/2016), Hypertension, and Sepsis (HCC) (09/19/2013).   Surgical History:   Past Surgical History:  Procedure Laterality Date   AV FISTULA PLACEMENT Right 07/06/2022   Procedure: ARTERIOVENOUS GRAFT CREATION;  Surgeon: Cephus Shelling, MD;  Location: [facility] OR;  Service: Vascular;  Laterality: Right;   CESAREAN SECTION N/A 10/05/2019   Procedure: CESAREAN SECTION;  Surgeon: Archer Lodge Bing, MD;  Location: [facility] LD ORS;  Service: Obstetrics;  Laterality: N/A;   INCISION AND DRAINAGE ABSCESS Left 09/28/2019   Procedure: INCISION AND DRAINAGE VULVAR ABCESS;  Surgeon: Tilda Burrow, MD;  Location[facility] OR;  Service: Gynecology;  Laterality: Left;   INCISION AND DRAINAGE PERIRECTAL ABSCESS Right 08/18/2013   Procedure: IRRIGATION AND DEBRIDEMENT GLUTEAL ABSCESS;  Surgeon: Axel Filler, MD;  Location: [facility] OR;  Service: General;  Laterality: Right;   INCISION AND DRAINAGE PERIRECTAL ABSCESS Right 09/19/2013   Procedure: IRRIGATION AND DEBRIDEMENT RIGHT GLUTEAL AND LABIAL ABSCESSES;  Surgeon: Axel Filler, MD;  Location: [facility] OR;  Service: General;  Laterality: Right;   INCISION AND DRAINAGE PERIRECTAL ABSCESS Right 09/24/2013   Procedure: IRRIGATION AND DEBRIDEMENT PERIRECTAL ABSCESS;  Surgeon: Cherylynn Ridges, MD;  Location: [facility] OR;  Service: General;  Laterality: Right;     Social History:   reports that she has  never smoked. She has never been exposed to tobacco smoke. She has never used smokeless tobacco. She reports that she does not currently use alcohol. She reports that she does not use drugs.   Family History:  Her family history includes Asthma in her mother; Carpal tunnel syndrome in her mother; Diabetes in her paternal grandmother; Gout in her father. There is no history of Anesthesia problems.   Allergies Allergies  Allergen Reactions   Cephalexin Anaphylaxis    Has gotten ceftriaxone in the past    Penicillins Hives and Rash    Has patient had a PCN reaction causing immediate rash, facial/tongue/throat swelling, SOB or lightheadedness with hypotension: Yes Has patient had a PCN reaction causing severe rash involving mucus membranes or skin necrosis: No Has patient had a PCN reaction that required hospitalization: Yes Has patient had a PCN reaction occurring within the last 10 years: No Spoke with pt - childhood hives told by mom, tried no pcns since, doesn't remember reaction herself    Benadryl [Diphenhydramine] Itching   Doxycycline Itching     Home Medications  Prior to Admission medications   Medication Sig Start Date End Date Taking? Authorizing Provider  acetaminophen (TYLENOL) 500 MG tablet Take 1,000 mg by mouth every 6 (six) hours as needed for mild pain or moderate pain.   Yes [provider]  blood glucose meter kit and supplies KIT Dispense based on patient and insurance preference. Use up to four times daily as directed. (FOR ICD-9 250.00, 250.01). 05/25/17  Yes Jacalyn Lefevre, MD  Blood Pressure Monitoring (BLOOD PRESSURE KIT) DEVI 1 Device by Does not apply route as needed. 05/06/19  Yes Anyanwu, Jethro Bastos, MD  carvedilol (COREG) 25 MG tablet Take 1 tablet (25 mg total) by mouth 2 (two) times daily. Patient taking differently: Take 12.5 mg by mouth 2 (two) times daily. 11/11/21  Yes Dahal, Melina Schools, MD  Continuous Blood Gluc Sensor (DEXCOM G6 SENSOR) MISC Change  sensors every 10 days 07/02/22  Yes Shamleffer, Konrad Dolores, MD  Continuous Blood Gluc Transmit (DEXCOM G6 TRANSMITTER) MISC Inject 1 Device into the skin as directed. Use to check blood sugar daily 07/02/22  Yes Shamleffer, Konrad Dolores, MD  Continuous Glucose Sensor (DEXCOM G7 SENSOR) MISC Change sensors every 10 days 09/07/22  Yes Shamleffer, Konrad Dolores, MD  furosemide (LASIX) 80 MG tablet Take 80 mg by mouth 2 (two) times daily.   Yes [provider]  insulin aspart (NOVOLOG) 100 UNIT/ML injection Max daily 50 units .Uses insulin pump 09/07/22  Yes Shamleffer, Konrad Dolores, MD  Insulin Disposable Pump (OMNIPOD 5 G6 INTRO, GEN 5,) KIT Change pods every 3 days 01/22/22  Yes Shamleffer, Konrad Dolores, MD  Insulin Disposable Pump (OMNIPOD 5 G6 POD, GEN 5,) MISC CHANGE EVERY 3 DAYS 04/13/22  Yes Shamleffer, Konrad Dolores, MD  Insulin Pen Needle 32G X 4 MM MISC Use as directed 10/07/20  Yes Ghimire, Werner Lean, MD  lamoTRIgine (LAMICTAL) 100 MG tablet Take 100 mg by mouth daily.   Yes [provider]  LORazepam (ATIVAN) 0.5 MG tablet Take 0.5 mg by mouth daily as needed for anxiety.   Yes [provider]  losartan (COZAAR) 50 MG tablet Take 50 mg by mouth daily.   Yes [provider]  oxyCODONE-acetaminophen (PERCOCET/ROXICET) 5-325 MG tablet Take 1 tablet by mouth every 4 (four) hours as needed for severe pain. 07/10/22  Yes Rhyne, Samantha J, PA-C  rosuvastatin (CRESTOR) 40 MG tablet Take 40 mg by mouth daily. 05/22/22  Yes [provider]  sevelamer carbonate (RENVELA) 800 MG tablet Take 2,400 mg by mouth 3 (three) times daily with meals.   Yes [provider]  SUMAtriptan (IMITREX) 50 MG tablet Take 50 mg by mouth every 2 (two) hours as needed for migraine or headache. 06/20/22  Yes [provider]  amLODipine (NORVASC) 10 MG tablet Take 1 tablet (10 mg total) by mouth daily. 11/11/21 07/03/22  Lorin Glass, MD     Critical care  time: 45 minutes    Raechel Chute, MD [facility] 09/08/2022 9:37 AM

## 2022-09-08 NOTE — Progress Notes (Signed)
Pharmacy Antibiotic Note  Ashley Freeman is a 32 y.o. female admitted on 09/07/2022 with pneumonia.  Pharmacy has been consulted for Cefepime dosing.  Pt on HD PTA, unsure of schedule at [facility].   Pt has allergy to cephalosporins listed but tolerated ceftriaxone in past.   Plan: Cefepime 1 gm IV Q24H ordered to start 5/11 @ ~ 0100.  Height: 5\' 3"  (160 cm) Weight: 59.3 kg (130 lb 11.7 oz) IBW/kg (Calculated) : 52.4  Temp (24hrs), Avg:97.5 F (36.4 C), Min:97.5 F (36.4 C), Max:97.5 F (36.4 C)  Recent Labs  Lab 09/07/22 1802 09/07/22 1840 09/07/22 2236  WBC 7.4  --   --   CREATININE 6.17*  --   --   LATICACIDVEN  --  1.0 1.6    Estimated Creatinine Clearance: 11 mL/min (A) (by C-G formula based on SCr of 6.17 mg/dL (H)).    Allergies  Allergen Reactions   Cephalexin Anaphylaxis    Has gotten ceftriaxone in the past    Penicillins Hives and Rash    Has patient had a PCN reaction causing immediate rash, facial/tongue/throat swelling, SOB or lightheadedness with hypotension: Yes Has patient had a PCN reaction causing severe rash involving mucus membranes or skin necrosis: No Has patient had a PCN reaction that required hospitalization: Yes Has patient had a PCN reaction occurring within the last 10 years: No Spoke with pt - childhood hives told by mom, tried no pcns since, doesn't remember reaction herself    Benadryl [Diphenhydramine] Itching   Doxycycline Itching    Antimicrobials this admission:   >>    >>   Dose adjustments this admission:   Microbiology results:  BCx:   UCx:    Sputum:    MRSA PCR:   Thank you for allowing pharmacy to be a part of this patient's care.  Montgomery Favor D 09/08/2022 12:53 AM

## 2022-09-08 NOTE — Progress Notes (Signed)
Pharmacy Antibiotic Note  Ashley Freeman [place] w/ T1DM on Insulin pump, HFrEF, ESRD Dialysis T/T/Sat S/p right upper extremity AV graft 07/06/22 Currently undergoing dialysis through right IJ, HTN, and HLD is a 32 y.o. female admitted on 09/07/2022 with sepsis.  Pharmacy has been consulted for vancomycin dosing.  Plan:  1) start vancomycin 1000 mg IV x 1 then 750 mg IV with HD --Target level 15 - 25 mcg/mL (level prior to 3rd maintenance HD session)  2) continue cefepime 1 gram IV every 24 hours (timed after HD)  Height: 5\' 3"  (160 cm) Weight: 59.3 kg (130 lb 11.7 oz) IBW/kg (Calculated) : 52.4  Temp (24hrs), Avg:99.4 F (37.4 C), Min:97.5 F (36.4 C), Max:102.3 F (39.1 C)  Recent Labs  Lab 09/07/22 1802 09/07/22 1840 09/07/22 2236 09/08/22 0634  WBC 7.4  --   --  19.7*  CREATININE 6.17*  --   --  6.99*  LATICACIDVEN  --  1.0 1.6  --     Estimated Creatinine Clearance: 9.7 mL/min (A) (by C-G formula based on SCr of 6.99 mg/dL (H)).    Allergies  Allergen Reactions   Cephalexin Anaphylaxis    Has gotten ceftriaxone in the past    Penicillins Hives and Rash    Has patient had a PCN reaction causing immediate rash, facial/tongue/throat swelling, SOB or lightheadedness with hypotension: Yes Has patient had a PCN reaction causing severe rash involving mucus membranes or skin necrosis: No Has patient had a PCN reaction that required hospitalization: Yes Has patient had a PCN reaction occurring within the last 10 years: No Spoke with pt - childhood hives told by mom, tried no pcns since, doesn't remember reaction herself    Benadryl [Diphenhydramine] Itching   Doxycycline Itching    Antimicrobials this admission: 05/11 vancomycin >>  05/11 metronidazole >> 05/11 cefepime >>   Microbiology results: 05/10 BCx: pending 05/10 MRSA PCR: negative  Thank you for allowing pharmacy to be a part of this patient's care.  Lowella Bandy 09/08/2022 9:58 AM

## 2022-09-09 LAB — HEPATITIS B SURFACE ANTIGEN: Hepatitis B Surface Ag: NONREACTIVE

## 2022-09-10 LAB — CULTURE, BLOOD (ROUTINE X 2): Special Requests: ADEQUATE

## 2022-09-11 LAB — CULTURE, BLOOD (ROUTINE X 2): Culture: NO GROWTH

## 2022-09-11 LAB — HEPATITIS B SURFACE ANTIBODY, QUANTITATIVE: Hep B S AB Quant (Post): 86.3 m[IU]/mL (ref >9.9)

## 2022-09-12 LAB — CULTURE, BLOOD (ROUTINE X 2): Culture: NO GROWTH

## 2022-09-16 NOTE — Discharge Summary (Signed)
     Patient admitted to ICU with toxic metabolic encephalopathy in the setting of severe hypoglycemia and Hypoglycemia Induced Seizures. Following HD treatment,  Patient expressed desire to leave the Hospital prior to completion of medical treatment. She was advised that she is currently in DKA and would need additional hospital stay to stabilized her blood sugar. She was also informed that she presented with profound hypoglycemia with resultant seizures likely due to her inability to correctly use her insulin pump and would need further education on pump usage. Patient has been warned that this is not Medically advisable at this time, and can result in Medical complications like Death and Disability, patient understands and accepts the risks involved and assumes full responsibilty of this decision. Patient is alert and oriented x4 and capable of making her own medical decisions. Patient proceeded to leave the hospital AMA accompanied by a family member. All her home medications were resumed at discharge. She was instructed to return to the ED with any medical issues. Patient verbalized understanding.

## 2022-09-17 NOTE — Progress Notes (Deleted)
Name: Ashley Freeman  Age/ Sex: 32 y.o., female   MRN/ DOB: [id number], 1990/07/24     PCP: [facility]   Reason for Endocrinology Evaluation: Type 2 Diabetes Mellitus  Initial Endocrine Consultative Visit: 06/27/18    PATIENT IDENTIFIER: Ashley Freeman is a 32 y.o. 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 The patient has followed with Endocrinology clinic since 06/27/2018 for consultative assistance with management of her diabetes.  DIABETIC HISTORY:  [name] was diagnosed with T1DM at age 56. Hx of multiple DKA's due to non-compliance. Her hemoglobin A1c has ranged from  in 11.3% in 2011, peaking at 19.3% in 2015.  S/P C-section 10/2019 Martie Lee)   She is S/P laparoscopic peritoneal dialysis catheter placement 01/2022 she was on PD but now on hemodialysis   Switch from the OmniPod to the tandem 08/2022  SUBJECTIVE:   During the last visit (06/22/2021): A1c 10.0 %    Today (09/17/2022): [name] is here for a follow up on diabetes management .  She checks glucose multiple times a day through CGM, she has noted hyperglycemia while on Peritoneal dialysis    She had an admission for DKA in 08/2022 with BG 600 mg/DL but no DKA as the battery on her pump/CGM was dead, she was discharged on Lantus and NovoLog with meals   She had a class IV kidney transplant orientation through [facility] on 02/26/2022, Brother will donate kidney    Denies nausea, or vomiting  Has headaches and hypotension during  dialysis  Minimal diarrhea  Has cataract sx  Pending laser eye treatment    HOME DIABETES REGIMEN:   Novolog      Pump   Tandem  Settings   Insulin type   NOVOLOG     Basal rate          0000 0.5    1100 0.6                I:C ratio          0000 10                       AIT  4 hrs            Sensitivity          0000   45           Goal          0000  120            PUMP STATISTICS: Average BG: 257  Average Daily Carbs (g): 75.4 Average Total Daily Insulin: 20.7  Average Daily Basal: 8.8 (43 %) Average Daily Bolus: 11.9 (57 %)     CONTINUOUS GLUCOSE MONITORING RECORD INTERPRETATION    Dates of Recording: 2/10-2/23/2024  Sensor description: dexcom  Results statistics:   CGM use % of time 86  Average and SD 257/118  Time in range   32%  % Time Above 180 14  % Time above 250 50  % Time Below target 2   Glycemic patterns summary: Hyperglycemia is noted during the day and the night Hyperglycemic episodes all day and night  Hypoglycemic episodes occurred after a manual bolus  Overnight periods: Variable      DIABETIC COMPLICATIONS: Microvascular complications:  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 Denies: neuropathy, retinopathy, CKD Last eye exam: Completed 06/18/2022  Macrovascular complications:    Denies: CAD, PVD, CVA  HISTORY:  Past Medical History:  Past Medical History:  Diagnosis Date   Abscess, gluteal, right 08/24/2013   Anemia 02/19/2012   Bartholin's gland abscess 09/19/2013   Bipolar disorder (HCC)    BV (bacterial vaginosis) 11/24/2015   Depression    Diabetes mellitus type I (HCC) 2001   Diagnosed at age 32 ; Type I   Diarrhea 05/30/2016   DKA (diabetic ketoacidoses) 08/19/2013   Also in 2018   ESRD (end stage renal disease) (HCC)    Gonorrhea 08/2011   Treated in 09/2011   HFrEF (heart failure with reduced ejection fraction) (HCC)    a. 2022 Echo: EF 40%; b. 10/2021 Echo: EF 55%; b. 07/2022 MV: No ischemia. EF 31%; c. 08/2022 Echo: EF 35%, mildly dil RV, sev TR.   History of trichomoniasis 05/31/2016   Hyperlipidemia 03/28/2016   Hypertension    NICM (nonischemic cardiomyopathy) (HCC)    Sepsis (HCC) 09/19/2013   Past Surgical History:  Past Surgical History:  Procedure Laterality Date   AV FISTULA PLACEMENT Right 07/06/2022   Procedure: ARTERIOVENOUS GRAFT  CREATION;  Surgeon: Cephus Shelling, MD;  Location: [facility] OR;  Service: Vascular;  Laterality: Right;   CESAREAN SECTION N/A 10/05/2019   Procedure: CESAREAN SECTION;  Surgeon: [place]liet Bing, MD;  Location: [facility] LD ORS;  Service: Obstetrics;  Laterality: N/A;   INCISION AND DRAINAGE ABSCESS Left 09/28/2019   Procedure: INCISION AND DRAINAGE VULVAR ABCESS;  Surgeon: Tilda Burrow, MD;  Location: [facility] OR;  Service: Gynecology;  Laterality: Left;   INCISION AND DRAINAGE PERIRECTAL ABSCESS Right 08/18/2013   Procedure: IRRIGATION AND DEBRIDEMENT GLUTEAL ABSCESS;  Surgeon: Axel Filler, MD;  Location: [facility] OR;  Service: General;  Laterality: Right;   INCISION AND DRAINAGE PERIRECTAL ABSCESS Right 09/19/2013   Procedure: IRRIGATION AND DEBRIDEMENT RIGHT GLUTEAL AND LABIAL ABSCESSES;  Surgeon: Axel Filler, MD;  Location: [facility] OR;  Service: General;  Laterality: Right;   INCISION AND DRAINAGE PERIRECTAL ABSCESS Right 09/24/2013   Procedure: IRRIGATION AND DEBRIDEMENT PERIRECTAL ABSCESS;  Surgeon: Cherylynn Ridges, MD;  Location: [facility] OR;  Service: General;  Laterality: Right;   Social History:  reports that she has never smoked. She has never been exposed to tobacco smoke. She has never used smokeless tobacco. She reports that she does not currently use alcohol. She reports that she does not use drugs. Family History:  Family History  Problem Relation Age of Onset   Asthma Mother    Carpal tunnel syndrome Mother    Gout Father    Diabetes Paternal Grandmother    Anesthesia problems Neg Hx      HOME MEDICATIONS: Allergies as of 09/18/2022       Reactions   Cephalexin Anaphylaxis   Has gotten ceftriaxone in the past    Penicillins Hives, Rash   Has patient had a PCN reaction causing immediate rash, facial/tongue/throat swelling, SOB or lightheadedness with hypotension: Yes Has patient had a PCN reaction causing severe rash involving mucus membranes or skin necrosis: No Has patient had a PCN  reaction that required hospitalization: Yes Has patient had a PCN reaction occurring within the last 10 years: No Spoke with pt - childhood hives told by mom, tried no pcns since, doesn't remember reaction herself    Benadryl [diphenhydramine] Itching   Doxycycline Itching        Medication List        Accurate as of Sep 17, 2022 12:33 PM.  If you have any questions, ask your nurse or doctor.          acetaminophen 500 MG tablet Commonly known as: TYLENOL Take 1,000 mg by mouth every 6 (six) hours as needed for mild pain or moderate pain.   amLODipine 10 MG tablet Commonly known as: NORVASC Take 1 tablet (10 mg total) by mouth daily.   blood glucose meter kit and supplies Kit Dispense based on patient and insurance preference. Use up to four times daily as directed. (FOR ICD-9 250.00, 250.01).   Blood Pressure Kit Devi 1 Device by Does not apply route as needed.   carvedilol 25 MG tablet Commonly known as: COREG Take 1 tablet (25 mg total) by mouth 2 (two) times daily. What changed: how much to take   Dexcom G6 Sensor Misc Change sensors every 10 days   Dexcom G7 Sensor Misc Change sensors every 10 days   Dexcom G6 Transmitter Misc Inject 1 Device into the skin as directed. Use to check blood sugar daily   furosemide 80 MG tablet Commonly known as: LASIX Take 80 mg by mouth 2 (two) times daily.   insulin aspart 100 UNIT/ML injection Commonly known as: novoLOG Max daily 50 units .Uses insulin pump   lamoTRIgine 100 MG tablet Commonly known as: LAMICTAL Take 100 mg by mouth daily.   LORazepam 0.5 MG tablet Commonly known as: ATIVAN Take 0.5 mg by mouth daily as needed for anxiety.   losartan 50 MG tablet Commonly known as: COZAAR Take 50 mg by mouth daily.   Omnipod 5 G6 Intro (Gen 5) Kit Change pods every 3 days   Omnipod 5 G6 Pods (Gen 5) Misc CHANGE EVERY 3 DAYS   oxyCODONE-acetaminophen 5-325 MG tablet Commonly known as:  PERCOCET/ROXICET Take 1 tablet by mouth every 4 (four) hours as needed for severe pain.   Pentips 32G X 4 MM Misc Generic drug: Insulin Pen Needle Use as directed   rosuvastatin 40 MG tablet Commonly known as: CRESTOR Take 40 mg by mouth daily.   sevelamer carbonate 800 MG tablet Commonly known as: RENVELA Take 2,400 mg by mouth 3 (three) times daily with meals.   SUMAtriptan 50 MG tablet Commonly known as: IMITREX Take 50 mg by mouth every 2 (two) hours as needed for migraine or headache.         OBJECTIVE:   PHYSICAL EXAM: [id number] were no vitals taken for this visit.   EXAM: General: Pt appears well and is in NAD  Lungs: Clear with good BS bilat   Heart: RRR  Abdomen:   soft and non-tender  Extremities: No pretibial edema   Mental Status: Judgment, insight: intact Mood and affect: no depression, anxiety, or agitation     DM foot exam: 06/22/2022 The skin of the feet is intact without sores or ulcerations. The pedal pulses are 2+ on right and 2+ on left. The sensation is intact to a screening 5.07, 10 gram monofilament bilaterally     DATA REVIEWED:  Lab Results  Component Value Date   HGBA1C 10.4 (H) 09/08/2022   HGBA1C 8.5 (A) 03/15/2022   HGBA1C 9.1 (H) 11/09/2021   06/05/2022 Na 137 K 3.7 BUN 31 Creatinine 4.41 GFR 13   05/07/2022 4.979 u IU/mL  ASSESSMENT / PLAN / RECOMMENDATIONS:   1) Type 1 Diabetes Mellitus, Poorly controlled, With retinopathic and CKD IV complications- Most recent A1c of 10.0 %. Goal A1c < 7.0 %.    -Per patient, she recently had an A1c  of 10.0% (actual record not available) -In reviewing her Dexcom and pump download, she continues to have intermittent CGM use, she is currently on the Dexcom G7 because she was unable to obtain the G6 due to insurance/pharmacy issues, unfortunately when she does not use the Dexcom G6 the pump is on manual mode which does not improve hyperglycemia. -She has been noted with manual bolus,  resulting in hypoglycemia, I have encouraged the patient to enter carbohydrate and glucose data only and let the pump make the decision regarding insulin dose -She is interested in switching to the tandem pump, referral will be placed to our CDE -I will increase her basal rate during the day, adjust her insulin to carb ratio as well as sensitivity factor as below  MEDICATIONS:  Novolog     Pump   Omnipod Settings   Insulin type   NOVOLOG     Basal rate          0000 0.5    1100 0.6                I:C ratio          0000 10                       AIT  4 hrs            Sensitivity          0000  45           Goal          0000  120                     EDUCATION / INSTRUCTIONS: BG monitoring instructions: Patient is instructed to check her blood sugars 4 times a day, before meals and bedtime  Call [place] [facility] clinic if: BG persistently < 70 I reviewed the Rule of 15 for the treatment of hypoglycemia in detail with the patient. Literature supplied.   2) Diabetic complications:  Eye: Does not have known diabetic retinopathy.  Neuro/ Feet: Does not have known diabetic peripheral neuropathy. Renal: Patient does have known baseline CKD.  She is on hemodialysis   F/U in 3 months      Signed electronically by: Lyndle Herrlich, MD  [facility] Endocrinology  [facility] [address] [place] [place], [place] [address] Phone: [phone] FAX: [phone]   CC: [facility] [address] [place] [address] Phone: [phone]  Fax: [phone]  Return to Endocrinology clinic as below: Future Appointments  Date Time Provider Department Center  09/18/2022 11:50 AM Jasara Corrigan, Konrad Dolores, MD [facility] None

## 2022-09-18 ENCOUNTER — Ambulatory Visit: Payer: Medicaid Other | Admitting: Internal Medicine

## 2022-09-19 ENCOUNTER — Encounter: Payer: Self-pay | Admitting: Internal Medicine

## 2022-09-27 ENCOUNTER — Telehealth: Payer: Self-pay

## 2022-09-27 ENCOUNTER — Encounter: Payer: Self-pay | Admitting: Internal Medicine

## 2022-09-27 NOTE — Telephone Encounter (Signed)
PA request received via CMM for Dexcom G7 Sensor  PA submitted to [facility] and has been APPROVED from 09/27/2022-09/27/2023  Key: [id number]

## 2022-09-29 NOTE — Telephone Encounter (Signed)
Patient dismissed from [facility] Endocrinology and all providers practicing at this clinic due to patient / provider relationship being compromised. 09/19/22

## 2022-10-03 ENCOUNTER — Other Ambulatory Visit: Payer: Self-pay | Admitting: Internal Medicine

## 2022-10-09 ENCOUNTER — Ambulatory Visit: Payer: Medicaid Other | Admitting: Internal Medicine

## 2022-10-10 ENCOUNTER — Other Ambulatory Visit: Payer: Self-pay | Admitting: Internal Medicine

## 2022-12-12 ENCOUNTER — Other Ambulatory Visit: Payer: Self-pay

## 2022-12-12 ENCOUNTER — Encounter: Payer: Self-pay | Admitting: *Deleted

## 2022-12-12 DIAGNOSIS — R1110 Vomiting, unspecified: Secondary | ICD-10-CM | POA: Insufficient documentation

## 2022-12-12 DIAGNOSIS — N186 End stage renal disease: Secondary | ICD-10-CM | POA: Diagnosis not present

## 2022-12-12 DIAGNOSIS — R609 Edema, unspecified: Secondary | ICD-10-CM | POA: Insufficient documentation

## 2022-12-12 DIAGNOSIS — R140 Abdominal distension (gaseous): Secondary | ICD-10-CM | POA: Insufficient documentation

## 2022-12-12 DIAGNOSIS — R059 Cough, unspecified: Secondary | ICD-10-CM | POA: Diagnosis not present

## 2022-12-12 DIAGNOSIS — Z1152 Encounter for screening for COVID-19: Secondary | ICD-10-CM | POA: Insufficient documentation

## 2022-12-12 DIAGNOSIS — Z992 Dependence on renal dialysis: Secondary | ICD-10-CM | POA: Diagnosis not present

## 2022-12-12 DIAGNOSIS — E1022 Type 1 diabetes mellitus with diabetic chronic kidney disease: Secondary | ICD-10-CM | POA: Insufficient documentation

## 2022-12-12 NOTE — ED Triage Notes (Addendum)
Pt is here due to abdominal distention from fluid retention which has been increasing over the past week.  She reports that she has fluid buildup in her abdomen.  She states that this has occurred before and she had an additional dialysis treatment to treat this in the past.  Pt receives HD on Tuesday, Thursday and Saturday and she had a full treament yesterday.  No CP.  Pt was seen at [facility] an hour ago but left because of the wait.  K wnl, see labs below.    omponent Ref Range & Units 19:04  Sodium 135 - 145 mmol/L 137  Potassium 3.4 - 4.8 mmol/L 4.0  Chloride 98 - 107 mmol/L 103  CO2 20.0 - 31.0 mmol/L 25.3  Anion Gap 5 - 14 mmol/L 9  BUN 9 - 23 mg/dL 35 High   Creatinine 1.51 - 1.02 mg/dL 7.61 High   BUN/Creatinine Ratio 4  eGFR CKD-EPI (2021) Female >=60 mL/min/1.43m2 7 Low   Comment: eGFR calculated with CKD-EPI 2021 equation in accordance with [employer] and [employer] of Nephrology Task Force recommendations.  Glucose 70 - 179 mg/dL 81  Calcium 8.7 - 60.7 mg/dL 8.5 Low   Albumin 3.4 - 5.0 g/dL 3.5  Total Protein 5.7 - 8.2 g/dL 6.2  Total Bilirubin 0.3 - 1.2 mg/dL 0.4  AST <=37 U/L 59 High   ALT 10 - 49 U/L 54 High   Alkaline Phosphatase 46 - 116 U/L 249 High   Resulting Agency [facility]

## 2022-12-13 ENCOUNTER — Emergency Department: Payer: Medicaid Other

## 2022-12-13 ENCOUNTER — Emergency Department
Admission: EM | Admit: 2022-12-13 | Discharge: 2022-12-13 | Disposition: A | Discharge status: Home / Self Care | Payer: Medicaid Other | Attending: Emergency Medicine | Admitting: Emergency Medicine

## 2022-12-13 DIAGNOSIS — N186 End stage renal disease: Secondary | ICD-10-CM

## 2022-12-13 DIAGNOSIS — R140 Abdominal distension (gaseous): Secondary | ICD-10-CM

## 2022-12-13 LAB — SARS CORONAVIRUS 2 BY RT PCR: SARS Coronavirus 2 by RT PCR: NEGATIVE

## 2022-12-13 NOTE — ED Provider Notes (Signed)
[facility] Provider Note    Event Date/Time   First MD Initiated Contact with Patient 12/13/22 0110     (approximate)   History   Edema   HPI  Ashley Freeman is a 32 y.o. female who presents to the ED for evaluation of Edema   I review [facility] medical admission from 8/8 - 8/9.  CT imaging with cholecystitis, pain resolving with hemodialysis.  Referred to outpatient surgery due to considerations of outpatient cholecystectomy on a routine basis. She has history of type 1 diabetes and T/Th/S hemodialysis. I also review [facility] ED blood work that was done about 5 hours ago where she presented with the same complaints but left without being seen by a physician.  Metabolic panel with ESRD, normal potassium of 4.0.  Mild normocytic anemia of 10.7, normal WBC.  Normal lipase.  Patient presents to the ED for evaluation of bloating sensation, concern for fluid on her abdomen.  She reports she did have an episode of emesis earlier last night.  She has had a nonproductive cough for the past couple days.   Physical Exam   Triage Vital Signs: ED Triage Vitals  Encounter Vitals Group     BP 12/12/22 2038 (!) 138/94     Systolic BP Percentile --      Diastolic BP Percentile --      Pulse Rate 12/12/22 2038 (!) 106     Resp 12/12/22 2038 17     Temp 12/12/22 2038 98.3 F (36.8 C)     Temp Source 12/12/22 2038 Oral     SpO2 12/12/22 2038 100 %     Weight 12/12/22 2038 138 lb (62.6 kg)     Height 12/12/22 2038 5\' 3"  (1.6 m)     Head Circumference --      Peak Flow --      Pain Score 12/12/22 2045 8     Pain Loc --      Pain Education --      Exclude from Growth Chart --     Most recent vital signs: Vitals:   12/12/22 2038 12/13/22 0134  BP: (!) 138/94 132/78  Pulse: (!) 106 99  Resp: 17 20  Temp: 98.3 F (36.8 C)   SpO2: 100% 99%    General: Awake, no distress.  Resting comfortably and look systemically well CV:  Good peripheral perfusion.   Resp:  Normal effort.  No wheezing Abd:  Minimal distention.  Diffuse and mild tenderness without peritoneal or localizing features. MSK:  No deformity noted.  Neuro:  No focal deficits appreciated. Other:     ED Results / Procedures / Treatments   Labs (all labs ordered are listed, but only abnormal results are displayed) Labs Reviewed  SARS CORONAVIRUS 2 BY RT PCR    EKG   RADIOLOGY CXR interpreted by me without evidence of acute cardiopulmonary pathology. CT abdomen/pelvis interpreted by me with minimal ascites, no signs of cholecystitis  Official radiology report(s): CT ABDOMEN PELVIS WO CONTRAST  Result Date: 12/13/2022 CLINICAL DATA:  History of dialysis and fluid retention, initial encounter EXAM: CT ABDOMEN AND PELVIS WITHOUT CONTRAST TECHNIQUE: Multidetector CT imaging of the abdomen and pelvis was performed following the standard protocol without IV contrast. RADIATION DOSE REDUCTION: This exam was performed according to the departmental dose-optimization program which includes automated exposure control, adjustment of the mA and/or kV according to patient size and/or use of iterative reconstruction technique. COMPARISON:  09/07/2022 FINDINGS: Lower chest: Mild interstitial  changes are noted consistent with edema. No effusion is noted. Hepatobiliary: No focal liver abnormality is seen. No gallstones, gallbladder wall thickening, or biliary dilatation. Pancreas: Unremarkable. No pancreatic ductal dilatation or surrounding inflammatory changes. Spleen: Normal in size without focal abnormality. Adrenals/Urinary Tract: Adrenal glands are within normal limits. Kidneys demonstrate no renal calculi or obstructive changes. The bladder is decompressed. Stomach/Bowel: No obstructive or inflammatory changes of the colon are noted. Mild retained fecal material is seen. The appendix is within normal limits. Small bowel and stomach are within normal limits. Vascular/Lymphatic: No significant  vascular findings are present. No enlarged abdominal or pelvic lymph nodes. Reproductive: Uterus and bilateral adnexa are unremarkable. Other: Mild free fluid is noted within the abdomen and pelvis. Musculoskeletal: No acute or significant osseous findings. IMPRESSION: Changes consistent with interstitial edema in the lung bases. Mild ascites. No other focal abnormality is noted. Electronically Signed   By: Alcide Clever M.D.   On: 12/13/2022 02:34   DG Chest 2 View  Result Date: 12/13/2022 CLINICAL DATA:  Cough EXAM: CHEST - 2 VIEW COMPARISON:  09/07/2022 FINDINGS: Dialysis catheter is been removed in the interval. The heart is enlarged in size. No focal infiltrate or effusion is noted. No bony abnormality is seen. IMPRESSION: Mild cardiomegaly.  No acute abnormality noted. Electronically Signed   By: Alcide Clever M.D.   On: 12/13/2022 02:07    PROCEDURES and INTERVENTIONS:  Procedures  Medications - No data to display   IMPRESSION / MDM / ASSESSMENT AND PLAN / ED COURSE  I reviewed the triage vital signs and the nursing notes.  Differential diagnosis includes, but is not limited to, biliary colic, cholecystitis, COVID, pneumonia, symptomatic ascites  {Patient presents with symptoms of an acute illness or injury that is potentially life-threatening.  Dialysis patient presents with bloating sensation, with a reassuring workup and suitable for outpatient management.  Blood work was done at outside ED a few hours ago, reviewed above and without indications for repeat serum testing.  CXR is clear and her COVID test is negative.  CT of her abdomen/pelvis without signs of cholecystitis.  No significant ascites that would require tapping emergently.  She has dialysis coming up later this morning and I think that would be the most helpful for her symptoms.  We discussed management at home and return precautions.  She is suitable for outpatient management.  Clinical Course as of 12/13/22 0259  Thu Dec 13, 2022  0242 Reassessed and discussed generally reassuring workup.  She has dialysis later this morning at 6:45 AM.  She is comfortable going home and I think this is reasonable. [DS]    Clinical Course User Index [DS] Delton Prairie, MD     FINAL CLINICAL IMPRESSION(S) / ED DIAGNOSES   Final diagnoses:  Abdominal bloating  ESRD (end stage renal disease) on dialysis [facility])     Rx / DC Orders   ED Discharge Orders     None        Note:  This document was prepared using Dragon voice recognition software and may include unintentional dictation errors.   Delton Prairie, MD 12/13/22 0300

## 2022-12-14 ENCOUNTER — Ambulatory Visit: Payer: Medicaid Other | Admitting: Internal Medicine

## 2023-01-05 ENCOUNTER — Emergency Department (HOSPITAL_COMMUNITY)
Admission: EM | Admit: 2023-01-05 | Discharge: 2023-01-05 | Discharge status: Against Medical Advice | Payer: Medicaid Other | Attending: Emergency Medicine | Admitting: Emergency Medicine

## 2023-01-05 DIAGNOSIS — Z79899 Other long term (current) drug therapy: Secondary | ICD-10-CM | POA: Diagnosis not present

## 2023-01-05 DIAGNOSIS — E162 Hypoglycemia, unspecified: Secondary | ICD-10-CM

## 2023-01-05 DIAGNOSIS — N186 End stage renal disease: Secondary | ICD-10-CM | POA: Diagnosis not present

## 2023-01-05 DIAGNOSIS — Z794 Long term (current) use of insulin: Secondary | ICD-10-CM | POA: Diagnosis not present

## 2023-01-05 DIAGNOSIS — I120 Hypertensive chronic kidney disease with stage 5 chronic kidney disease or end stage renal disease: Secondary | ICD-10-CM | POA: Diagnosis not present

## 2023-01-05 DIAGNOSIS — E1022 Type 1 diabetes mellitus with diabetic chronic kidney disease: Secondary | ICD-10-CM | POA: Insufficient documentation

## 2023-01-05 DIAGNOSIS — Z992 Dependence on renal dialysis: Secondary | ICD-10-CM | POA: Insufficient documentation

## 2023-01-05 DIAGNOSIS — E10649 Type 1 diabetes mellitus with hypoglycemia without coma: Secondary | ICD-10-CM | POA: Diagnosis not present

## 2023-01-05 LAB — COMPREHENSIVE METABOLIC PANEL
ALT: 70 U/L — ABNORMAL HIGH (ref 0–44)
AST: 96 U/L — ABNORMAL HIGH (ref 15–41)
Albumin: 3.4 g/dL — ABNORMAL LOW (ref 3.5–5.0)
Alkaline Phosphatase: 208 U/L — ABNORMAL HIGH (ref 38–126)
Anion gap: 13 (ref 5–15)
BUN: 37 mg/dL — ABNORMAL HIGH (ref 6–20)
CO2: 24 mmol/L (ref 22–32)
Calcium: 8.4 mg/dL — ABNORMAL LOW (ref 8.9–10.3)
Chloride: 98 mmol/L (ref 98–111)
Creatinine, Ser: 6.08 mg/dL — ABNORMAL HIGH (ref 0.44–1.00)
GFR, Estimated: 9 mL/min — ABNORMAL LOW (ref >60)
Glucose, Bld: 47 mg/dL — ABNORMAL LOW (ref 70–99)
Potassium: 3.8 mmol/L (ref 3.5–5.1)
Sodium: 135 mmol/L (ref 135–145)
Total Bilirubin: 0.8 mg/dL (ref 0.3–1.2)
Total Protein: 6.2 g/dL — ABNORMAL LOW (ref 6.5–8.1)

## 2023-01-05 LAB — I-STAT CHEM 8, ED
BUN: 40 mg/dL — ABNORMAL HIGH (ref 6–20)
Calcium, Ion: 0.95 mmol/L — ABNORMAL LOW (ref 1.15–1.40)
Chloride: 98 mmol/L (ref 98–111)
Creatinine, Ser: 6.4 mg/dL — ABNORMAL HIGH (ref 0.44–1.00)
Glucose, Bld: 43 mg/dL — CL (ref 70–99)
HCT: 36 % (ref 36.0–46.0)
Hemoglobin: 12.2 g/dL (ref 12.0–15.0)
Potassium: 3.9 mmol/L (ref 3.5–5.1)
Sodium: 135 mmol/L (ref 135–145)
TCO2: 25 mmol/L (ref 22–32)

## 2023-01-05 LAB — CBG MONITORING, ED
Glucose-Capillary: 34 mg/dL — CL (ref 70–99)
Glucose-Capillary: 237 mg/dL — ABNORMAL HIGH (ref 70–99)
Glucose-Capillary: 253 mg/dL — ABNORMAL HIGH (ref 70–99)
Glucose-Capillary: 154 mg/dL — ABNORMAL HIGH (ref 70–99)
Glucose-Capillary: 166 mg/dL — ABNORMAL HIGH (ref 70–99)

## 2023-01-05 LAB — CBC
HCT: 35.7 % — ABNORMAL LOW (ref 36.0–46.0)
Hemoglobin: 10.5 g/dL — ABNORMAL LOW (ref 12.0–15.0)
MCH: 26.1 pg (ref 26.0–34.0)
MCHC: 29.4 g/dL — ABNORMAL LOW (ref 30.0–36.0)
MCV: 88.6 fL (ref 80.0–100.0)
Platelets: 218 10*3/uL (ref 150–400)
RBC: 4.03 MIL/uL (ref 3.87–5.11)
RDW: 15.6 % — ABNORMAL HIGH (ref 11.5–15.5)
WBC: 9.8 10*3/uL (ref 4.0–10.5)
nRBC: 0 % (ref 0.0–0.2)

## 2023-01-05 LAB — HCG, SERUM, QUALITATIVE: Preg, Serum: NEGATIVE

## 2023-01-05 MED ORDER — DEXTROSE 50 % IV SOLN
1.0000 | Freq: Once | INTRAVENOUS | Status: AC
  Filled 2023-01-05: qty 50

## 2023-01-05 MED ORDER — SODIUM CHLORIDE 0.9% FLUSH: 3.0000 mL | INTRAVENOUS | Status: DC | PRN

## 2023-01-05 MED ORDER — DEXTROSE 10 % IV SOLN: 100.0000 mL | Freq: Once | INTRAVENOUS | Status: DC

## 2023-01-05 MED ORDER — SODIUM CHLORIDE 0.9% FLUSH: 3.0000 mL | Freq: Two times a day (BID) | INTRAVENOUS | Status: DC

## 2023-01-05 MED ORDER — DEXTROSE 10 % IV SOLN
100.0000 mL | Freq: Once | INTRAVENOUS | Status: AC
  Administered 2023-01-05: 100 mL via INTRAVENOUS

## 2023-01-05 MED ORDER — SODIUM CHLORIDE 0.9 % IV SOLN: 250.0000 mL | INTRAVENOUS | Status: DC | PRN

## 2023-01-05 MED ORDER — DEXTROSE 50 % IV SOLN
INTRAVENOUS | Status: AC
  Administered 2023-01-05: 50 mL via INTRAVENOUS
  Filled 2023-01-05: qty 50

## 2023-01-05 NOTE — ED Notes (Addendum)
EDP informed Primary RN that the Pt has chosen to leave AMA after speaking to the Pt about admission.  AMA form signed and IV removed.

## 2023-01-05 NOTE — ED Notes (Signed)
Pt requested to be disconnected from fluids, stated she will be going to [facility] where she will be getting the best care. EDP aware

## 2023-01-05 NOTE — ED Triage Notes (Signed)
Pt arrived POV from home stating she is weak and per family she is a diabetic and has had low blood sugars all morning. Initially it was 60 so she took a dose of Baqsimi and her CBG raised to 165 but then started dropping again. Most recent reading on pt's meter is 55.

## 2023-01-05 NOTE — ED Provider Notes (Signed)
EMERGENCY DEPARTMENT AT [facility] Provider Note   CSN: [id number] Arrival date & time: 01/05/23  1440     History  Chief Complaint  Patient presents with   Hypoglycemia   HPI DELNA COMBES is a 32 y.o. female with type 1 diabetes, ESRD, hypertension and dilated cardiomyopathy presenting for hypoglycemia. Patient arrived from home and stated she felt weak and per family has had low blood sugars this morning. Initial BG was 60, took a dose of Baqsimi and CBG improved to 165 but continue to drop. Most recent from home was 55.  States she has an insulin pump.  States the pump is working as it normally does.  Denies any recent nausea vomiting diarrhea.  Denies fever.  States her last dialysis was this past Thursday and they took 3 L which she states is a below average amount.  HPI     Home Medications Prior to Admission medications   Medication Sig Start Date End Date Taking? Authorizing Provider  acetaminophen (TYLENOL) 500 MG tablet Take 1,000 mg by mouth every 6 (six) hours as needed for mild pain or moderate pain.    [provider]  amLODipine (NORVASC) 10 MG tablet Take 1 tablet (10 mg total) by mouth daily. 11/11/21 07/03/22  Lorin Glass, MD  blood glucose meter kit and supplies KIT Dispense based on patient and insurance preference. Use up to four times daily as directed. (FOR ICD-9 250.00, 250.01). 05/25/17   Jacalyn Lefevre, MD  Blood Pressure Monitoring (BLOOD PRESSURE KIT) DEVI 1 Device by Does not apply route as needed. 05/06/19   Anyanwu, Jethro Bastos, MD  carvedilol (COREG) 25 MG tablet Take 1 tablet (25 mg total) by mouth 2 (two) times daily. Patient taking differently: Take 12.5 mg by mouth 2 (two) times daily. 11/11/21   Lorin Glass, MD  Continuous Blood Gluc Sensor (DEXCOM G6 SENSOR) MISC Change sensors every 10 days 07/02/22   Shamleffer, Konrad Dolores, MD  Continuous Blood Gluc Transmit (DEXCOM G6 TRANSMITTER) MISC Inject 1 Device  into the skin as directed. Use to check blood sugar daily 07/02/22   Shamleffer, Konrad Dolores, MD  Continuous Glucose Sensor (DEXCOM G7 SENSOR) MISC Change sensors every 10 days 09/07/22   Shamleffer, Konrad Dolores, MD  furosemide (LASIX) 80 MG tablet Take 80 mg by mouth 2 (two) times daily.    [provider]  insulin aspart (NOVOLOG) 100 UNIT/ML injection Max daily 50 units .Uses insulin pump 09/07/22   Shamleffer, Konrad Dolores, MD  Insulin Disposable Pump (OMNIPOD 5 G6 INTRO, GEN 5,) KIT Change pods every 3 days 01/22/22   Shamleffer, Konrad Dolores, MD  Insulin Disposable Pump (OMNIPOD 5 G6 POD, GEN 5,) MISC CHANGE EVERY 3 DAYS 04/13/22   Shamleffer, Konrad Dolores, MD  Insulin Pen Needle 32G X 4 MM MISC Use as directed 10/07/20   Ghimire, Werner Lean, MD  lamoTRIgine (LAMICTAL) 100 MG tablet Take 100 mg by mouth daily.    [provider]  LORazepam (ATIVAN) 0.5 MG tablet Take 0.5 mg by mouth daily as needed for anxiety.    [provider]  losartan (COZAAR) 50 MG tablet Take 50 mg by mouth daily.    [provider]  oxyCODONE-acetaminophen (PERCOCET/ROXICET) 5-325 MG tablet Take 1 tablet by mouth every 4 (four) hours as needed for severe pain. 07/10/22   Rhyne, Ames Coupe, PA-C  rosuvastatin (CRESTOR) 40 MG tablet Take 40 mg by mouth daily. 05/22/22   [provider]  sevelamer  carbonate (RENVELA) 800 MG tablet Take 2,400 mg by mouth 3 (three) times daily with meals.    [provider]  SUMAtriptan (IMITREX) 50 MG tablet Take 50 mg by mouth every 2 (two) hours as needed for migraine or headache. 06/20/22   [provider]      Allergies    Cephalexin, Penicillins, Benadryl [diphenhydramine], and Doxycycline    Review of Systems   See HPI for pertinent positives   Physical Exam   Vitals:   01/05/23 1600 01/05/23 1700  BP: 130/89 (!) 137/99  Pulse: 88 88  Resp: 19 (!) 21  Temp:    SpO2: 97% 100%    CONSTITUTIONAL:   well-appearing, NAD, lethargic NEURO:  Alert and oriented x 3, CN 3-12 grossly intact EYES:  eyes equal and reactive ENT/NECK:  Supple, no stridor  CARDIO:  regular rate and rhythm, appears well-perfused  PULM:  No respiratory distress, CTAB GI/GU:  non-distended, soft MSK/SPINE:  No gross deformities, no edema, moves all extremities, insulin pump noted about the left lateral high SKIN:  no rash, atraumatic  *Additional and/or pertinent findings included in MDM below  ED Results / Procedures / Treatments   Labs (all labs ordered are listed, but only abnormal results are displayed) Labs Reviewed  COMPREHENSIVE METABOLIC PANEL - Abnormal; Notable for the following components:      Result Value   Glucose, Bld 47 (*)    BUN 37 (*)    Creatinine, Ser 6.08 (*)    Calcium 8.4 (*)    Total Protein 6.2 (*)    Albumin 3.4 (*)    AST 96 (*)    ALT 70 (*)    Alkaline Phosphatase 208 (*)    GFR, Estimated 9 (*)    All other components within normal limits  CBC - Abnormal; Notable for the following components:   Hemoglobin 10.5 (*)    HCT 35.7 (*)    MCHC 29.4 (*)    RDW 15.6 (*)    All other components within normal limits  CBG MONITORING, ED - Abnormal; Notable for the following components:   Glucose-Capillary 34 (*)    All other components within normal limits  CBG MONITORING, ED - Abnormal; Notable for the following components:   Glucose-Capillary 237 (*)    All other components within normal limits  CBG MONITORING, ED - Abnormal; Notable for the following components:   Glucose-Capillary 253 (*)    All other components within normal limits  I-STAT CHEM 8, ED - Abnormal; Notable for the following components:   BUN 40 (*)    Creatinine, Ser 6.40 (*)    Glucose, Bld 43 (*)    Calcium, Ion 0.95 (*)    All other components within normal limits  CBG MONITORING, ED - Abnormal; Notable for the following components:   Glucose-Capillary 166 (*)    All other components within normal  limits  CBG MONITORING, ED - Abnormal; Notable for the following components:   Glucose-Capillary 154 (*)    All other components within normal limits  HCG, SERUM, QUALITATIVE  URINALYSIS, ROUTINE W REFLEX MICROSCOPIC  CBG MONITORING, ED    EKG None  Radiology No results found.  Procedures .Critical Care  Performed by: Gareth Eagle, PA-C Authorized by: Gareth Eagle, PA-C   Critical care provider statement:    Critical care time (minutes):  30   Critical care was necessary to treat or prevent imminent or life-threatening deterioration of the following conditions:  Metabolic crisis (  hypoglycemia)   Critical care was time spent personally by me on the following activities:  Development of treatment plan with patient or surrogate, discussions with consultants, evaluation of patient's response to treatment, examination of patient, ordering and review of laboratory studies, ordering and review of radiographic studies, ordering and performing treatments and interventions, pulse oximetry, re-evaluation of patient's condition and review of old charts     Medications Ordered in ED Medications  sodium chloride flush (NS) 0.9 % injection 3 mL (has no administration in time range)  sodium chloride flush (NS) 0.9 % injection 3 mL (has no administration in time range)  0.9 %  sodium chloride infusion (has no administration in time range)  dextrose 50 % solution 50 mL (50 mLs Intravenous Given 01/05/23 1511)  dextrose 10 % infusion (0 mLs Intravenous Stopped 01/05/23 1803)    ED Course/ Medical Decision Making/ A&P                                 Medical Decision Making Risk Prescription drug management.   Initial Impression and Ddx 32 year old well-appearing female presenting for hypoglycemia.  Exam notable for lethargy but otherwise reassuring.  DDx includes hypoglycemia, sepsis, stroke, electrolyte derangement, volume overload, insulin pump problem, other. Patient PMH that  increases complexity of ED encounter:  type 1 diabetes, ESRD, hypertension and dilated cardiomyopathy   Interpretation of Diagnostics - I independent reviewed and interpreted the labs as followed: hypoglycemia  Patient Reassessment and Ultimate Disposition/Management Patient persistently hypoglycemic on serial rechecks.  Treated with 1 amp of D50 and improved blood sugar to 250s.  An hour later rechecked and decreased to the 150s.  At this time fellows pertinent for her to be admitted for hypoglycemia.  Workup not suggestive of infection.  Suspect problem related to her pump.  Had plan to administer to hospital service.  Patient unfortunately left AMA stating that she wanted to go to [facility] because "all of my doctors are there".  I explained to her that she would be leaving AGAINST MEDICAL ADVICE to which she understood.  Discharged AMA.  Patient management required discussion with the following services or consulting groups:  Hospitalist Service  Complexity of Problems Addressed Acute complicated illness or Injury  Additional Data Reviewed and Analyzed Further history obtained from: Past medical history and medications listed in the EMR, Prior ED visit notes, and Recent discharge summary  Patient Encounter Risk Assessment Consideration of hospitalization         Final Clinical Impression(s) / ED Diagnoses Final diagnoses:  Hypoglycemia    Rx / DC Orders ED Discharge Orders     None         Gareth Eagle, PA-C 01/05/23 1815    Loetta Rough, MD 01/17/23 1115

## 2023-01-13 ENCOUNTER — Other Ambulatory Visit: Payer: Self-pay

## 2023-01-13 ENCOUNTER — Emergency Department
Admission: EM | Admit: 2023-01-13 | Discharge: 2023-01-13 | Disposition: A | Discharge status: Home / Self Care | Payer: Medicaid Other | Attending: Emergency Medicine | Admitting: Emergency Medicine

## 2023-01-13 DIAGNOSIS — I120 Hypertensive chronic kidney disease with stage 5 chronic kidney disease or end stage renal disease: Secondary | ICD-10-CM | POA: Insufficient documentation

## 2023-01-13 DIAGNOSIS — E10649 Type 1 diabetes mellitus with hypoglycemia without coma: Secondary | ICD-10-CM | POA: Diagnosis not present

## 2023-01-13 DIAGNOSIS — E1022 Type 1 diabetes mellitus with diabetic chronic kidney disease: Secondary | ICD-10-CM | POA: Insufficient documentation

## 2023-01-13 DIAGNOSIS — N186 End stage renal disease: Secondary | ICD-10-CM | POA: Insufficient documentation

## 2023-01-13 DIAGNOSIS — E162 Hypoglycemia, unspecified: Secondary | ICD-10-CM | POA: Diagnosis present

## 2023-01-13 LAB — COMPREHENSIVE METABOLIC PANEL
ALT: 29 U/L (ref 0–44)
AST: 21 U/L (ref 15–41)
Albumin: 4.2 g/dL (ref 3.5–5.0)
Alkaline Phosphatase: 186 U/L — ABNORMAL HIGH (ref 38–126)
Anion gap: 17 — ABNORMAL HIGH (ref 5–15)
BUN: 46 mg/dL — ABNORMAL HIGH (ref 6–20)
CO2: 22 mmol/L (ref 22–32)
Calcium: 9.3 mg/dL (ref 8.9–10.3)
Chloride: 98 mmol/L (ref 98–111)
Creatinine, Ser: 6.78 mg/dL — ABNORMAL HIGH (ref 0.44–1.00)
GFR, Estimated: 8 mL/min — ABNORMAL LOW (ref >60)
Glucose, Bld: 39 mg/dL — CL (ref 70–99)
Potassium: 3.6 mmol/L (ref 3.5–5.1)
Sodium: 137 mmol/L (ref 135–145)
Total Bilirubin: 1 mg/dL (ref 0.3–1.2)
Total Protein: 7.7 g/dL (ref 6.5–8.1)

## 2023-01-13 LAB — CBC WITH DIFFERENTIAL/PLATELET
Abs Immature Granulocytes: 0.02 10*3/uL (ref 0.00–0.07)
Basophils Absolute: 0.1 10*3/uL (ref 0.0–0.1)
Basophils Relative: 1 %
Eosinophils Absolute: 0.7 10*3/uL — ABNORMAL HIGH (ref 0.0–0.5)
Eosinophils Relative: 9 %
HCT: 42.5 % (ref 36.0–46.0)
Hemoglobin: 12.9 g/dL (ref 12.0–15.0)
Immature Granulocytes: 0 %
Lymphocytes Relative: 49 %
Lymphs Abs: 3.7 10*3/uL (ref 0.7–4.0)
MCH: 26.5 pg (ref 26.0–34.0)
MCHC: 30.4 g/dL (ref 30.0–36.0)
MCV: 87.4 fL (ref 80.0–100.0)
Monocytes Absolute: 0.8 10*3/uL (ref 0.1–1.0)
Monocytes Relative: 10 %
Neutro Abs: 2.3 10*3/uL (ref 1.7–7.7)
Neutrophils Relative %: 31 %
Platelets: 195 10*3/uL (ref 150–400)
RBC: 4.86 MIL/uL (ref 3.87–5.11)
RDW: 16.3 % — ABNORMAL HIGH (ref 11.5–15.5)
WBC: 7.5 10*3/uL (ref 4.0–10.5)
nRBC: 0 % (ref 0.0–0.2)

## 2023-01-13 LAB — CBG MONITORING, ED
Glucose-Capillary: 218 mg/dL — ABNORMAL HIGH (ref 70–99)
Glucose-Capillary: 30 mg/dL — CL (ref 70–99)
Glucose-Capillary: 184 mg/dL — ABNORMAL HIGH (ref 70–99)
Glucose-Capillary: 122 mg/dL — ABNORMAL HIGH (ref 70–99)

## 2023-01-13 MED ORDER — ONDANSETRON HCL 4 MG/2ML IJ SOLN
4.0000 mg | Freq: Once | INTRAMUSCULAR | Status: AC
  Administered 2023-01-13: 4 mg via INTRAVENOUS

## 2023-01-13 MED ORDER — DEXTROSE 50 % IV SOLN
1.0000 | Freq: Once | INTRAVENOUS | Status: AC
  Administered 2023-01-13: 50 mL via INTRAVENOUS

## 2023-01-13 MED ORDER — ONDANSETRON HCL 4 MG/2ML IJ SOLN
INTRAMUSCULAR | Status: AC
  Filled 2023-01-13: qty 2

## 2023-01-13 NOTE — Discharge Instructions (Signed)
You were seen in the ER today after being unresponsive due to your low blood sugar.  We strongly recommended you stay in the ER for continued monitoring but you have chosen to leave AGAINST MEDICAL ADVICE.  You are welcome to return for continued care at any time.  Please go to the nearest ER immediately for any new or worsening symptoms.

## 2023-01-13 NOTE — ED Provider Notes (Signed)
[facility] Provider Note    Event Date/Time   First MD Initiated Contact with Patient 01/13/23 1747     (approximate)   History   Hypoglycemia   HPI  Ashley Freeman is a 32 year old female with history of T1DM, ESRD, hypertension, dilated cardiomyopathy presenting to the emergency department for evaluation of hypoglycemia.  Patient was reportedly found unresponsive in her home by her mother.  Initial blood glucose of 42.  Received glucagon and route with EMS with improvement in mental status.  On presentation here, blood glucose of 30.  Received an amp of D50 prior to my initial evaluation.  Patient denies any recent symptoms.  Specifically denies fevers, chills, chest pain, shortness of breath, vomiting, diarrhea.  Reports she completed her dialysis session on Saturday.  Reports feeling cold currently, denies other complaints.  Says that she uses an insulin pump, no known dysfunction that she is aware of.  Patient was seen at the [facility] health ER on 01/05/2023 also for hypoglycemia.  At that time, she presented with weakness and was found to have an initial blood glucose of 60, took glucagon, had repeat of 55.  Was given an amp of D50 with improvement to the 250s, but decreased to 150 after an hour.  It was recommended that she get admitted, but she left AGAINST MEDICAL ADVICE.  Does appear that she is previously been admitted multiple times for DKA.     Physical Exam   Triage Vital Signs: ED Triage Vitals  Encounter Vitals Group     BP 01/13/23 1800 (!) 190/113     Systolic BP Percentile --      Diastolic BP Percentile --      Pulse Rate 01/13/23 1800 78     Resp 01/13/23 1800 11     Temp 01/13/23 1806 (!) 95.5 F (35.3 C)     Temp Source 01/13/23 1800 Oral     SpO2 01/13/23 1800 92 %     Weight 01/13/23 1813 130 lb (59 kg)     Height 01/13/23 1813 5\' 3"  (1.6 m)     Head Circumference --      Peak Flow --      Pain Score 01/13/23 1812 0      Pain Loc --      Pain Education --      Exclude from Growth Chart --     Most recent vital signs: Vitals:   01/13/23 2000 01/13/23 2038  BP: (!) 165/98   Pulse: 81   Resp: 14   Temp:  (!) 97.5 F (36.4 C)  SpO2: 100%      General: Awake but somnolent, interactive  CV:  Regular rate, good peripheral perfusion.  Resp:  Lungs clear, unlabored respirations.  Abd:  Soft, nondistended, nontender to palpation Neuro:  Symmetric facial movement, fluid speech, moving extremity spontaneously   ED Results / Procedures / Treatments   Labs (all labs ordered are listed, but only abnormal results are displayed) Labs Reviewed  COMPREHENSIVE METABOLIC PANEL - Abnormal; Notable for the following components:      Result Value   Glucose, Bld 39 (*)    BUN 46 (*)    Creatinine, Ser 6.78 (*)    Alkaline Phosphatase 186 (*)    GFR, Estimated 8 (*)    Anion gap 17 (*)    All other components within normal limits  CBC WITH DIFFERENTIAL/PLATELET - Abnormal; Notable for the following components:   RDW 16.3 (*)  Eosinophils Absolute 0.7 (*)    All other components within normal limits  CBG MONITORING, ED - Abnormal; Notable for the following components:   Glucose-Capillary 30 (*)    All other components within normal limits  CBG MONITORING, ED - Abnormal; Notable for the following components:   Glucose-Capillary 218 (*)    All other components within normal limits  CBG MONITORING, ED - Abnormal; Notable for the following components:   Glucose-Capillary 184 (*)    All other components within normal limits  CBG MONITORING, ED - Abnormal; Notable for the following components:   Glucose-Capillary 122 (*)    All other components within normal limits     EKG EKG independently reviewed interpreted by myself (ER attending) demonstrates:    RADIOLOGY Imaging independently reviewed and interpreted by myself demonstrates:    PROCEDURES:  Critical Care performed:  No  Procedures   MEDICATIONS ORDERED IN ED: Medications  ondansetron (ZOFRAN) injection 4 mg (4 mg Intravenous Given 01/13/23 1758)  dextrose 50 % solution 50 mL (50 mLs Intravenous Given 01/13/23 1758)     IMPRESSION / MDM / ASSESSMENT AND PLAN / ED COURSE  I reviewed the triage vital signs and the nursing notes.  Differential diagnosis includes, but is not limited to, hypoglycemia due to insulin pump dysfunction, inappropriate settings, infection, anemia, electrolyte abnormality  Patient's presentation is most consistent with acute presentation with potential threat to life or bodily function.  32 year old female presenting after being found unresponsive and hypoglycemic.  Recurrent hypoglycemia on presentation here.  Given an amp of D50 with improvement in blood glucose to 218.  Will send basic labs and continue to monitor glucose.  With 2 episodes of hypoglycemia within the last 2 weeks, suspect patient may require admission for further evaluation.  Labs sent before receiving D50 redemonstrated significant hypoglycemia at 39. While here, blood glucose did continue to downtrend from 218 to 184 to 122 despite her insulin pump being removed.  I did strongly recommend at least continued observation if not admission, but notified by RN that patient wanted to leave AGAINST MEDICAL ADVICE.  She removed all of her monitoring and was sitting on bed.  At the time I went to evaluate her, she was calm, tells me that she would prefer to receive her care at another hospital.  She expressed understanding with my concerns for her insulin dosing and recurrent hypoglycemia which can be life-threatening.  She did demonstrate decision-making capacity.  She does report that she is scheduled to have a procedure on her graft tomorrow.  The patient wants leave against medical advise. I spoke with the patient extensively regarding the risks of leaving prior to completion of an appropriate workup and interventions  given their symptomatology. We discussed possibility of significant morbidity and death that may occur as a result of them leaving. The patient expressed understanding of the situation and items mentioned, however would like to leave our care.   I have determined that the patient has the capacity for this medical decision. I have discussed the risks and benefits of the proposed treatment and treatment alternatives including non-treatment. I have offered an open invitation to the patient to return at any time for care. I have determined that the patient understands this discussion and that the patient willingly assumes the potential risk to their well-being as a result of this informed refusal.     FINAL CLINICAL IMPRESSION(S) / ED DIAGNOSES   Final diagnoses:  Hypoglycemia     Rx / DC  Orders   ED Discharge Orders     None        Note:  This document was prepared using Dragon voice recognition software and may include unintentional dictation errors.   Trinna Post, MD 01/13/23 [phone]

## 2023-01-13 NOTE — ED Triage Notes (Signed)
Pt arrived via [facility] with c/o hypoglycemia. Pt had CBG of 42 with EMS and was unresponsive. Pt received glucagon en route to ED.

## 2023-02-12 ENCOUNTER — Ambulatory Visit (INDEPENDENT_AMBULATORY_CARE_PROVIDER_SITE_OTHER): Payer: Medicaid Other | Admitting: Vascular Surgery

## 2023-02-12 ENCOUNTER — Encounter: Payer: Self-pay | Admitting: Vascular Surgery

## 2023-02-12 VITALS — BP 145/92 | HR 107 | Temp 98.2°F | Resp 18 | Ht 63.0 in | Wt 129.8 lb

## 2023-02-12 DIAGNOSIS — Z992 Dependence on renal dialysis: Secondary | ICD-10-CM | POA: Diagnosis not present

## 2023-02-12 DIAGNOSIS — N186 End stage renal disease: Secondary | ICD-10-CM | POA: Diagnosis not present

## 2023-02-12 NOTE — Progress Notes (Signed)
Patient name: Ashley Freeman MRN: [id number] DOB: 20-Apr-1991 Sex: female  REASON FOR CONSULT: Peripheral access  HPI: Ashley Freeman is a 32 y.o. female, with end-stage renal disease that presents for evaluation of peripheral access (per the referral).  Patient states the veins in her arms are small and they are having a hard time with lab draws.  Well-known to our practice and previously underwent right upper arm AV graft on 07/06/2022.  She states the right arm graft is working well and she used it today without issues in dialysis.  Past Medical History:  Diagnosis Date   Abscess, gluteal, right 08/24/2013   Anemia 02/19/2012   Bartholin's gland abscess 09/19/2013   Bipolar disorder (HCC)    BV (bacterial vaginosis) 11/24/2015   Depression    Diabetes mellitus type I (HCC) 2001   Diagnosed at age 52 ; Type I   Diarrhea 05/30/2016   DKA (diabetic ketoacidoses) 08/19/2013   Also in 2018   ESRD (end stage renal disease) (HCC)    Gonorrhea 08/2011   Treated in 09/2011   HFrEF (heart failure with reduced ejection fraction) (HCC)    a. 2022 Echo: EF 40%; b. 10/2021 Echo: EF 55%; b. 07/2022 MV: No ischemia. EF 31%; c. 08/2022 Echo: EF 35%, mildly dil RV, sev TR.   History of trichomoniasis 05/31/2016   Hyperlipidemia 03/28/2016   Hypertension    NICM (nonischemic cardiomyopathy) (HCC)    Sepsis (HCC) 09/19/2013    Past Surgical History:  Procedure Laterality Date   AV FISTULA PLACEMENT Right 07/06/2022   Procedure: ARTERIOVENOUS GRAFT CREATION;  Surgeon: Cephus Shelling, MD;  Location: [facility] OR;  Service: Vascular;  Laterality: Right;   CESAREAN SECTION N/A 10/05/2019   Procedure: CESAREAN SECTION;  Surgeon: Tappan Bing, MD;  Location: [facility] LD ORS;  Service: Obstetrics;  Laterality: N/A;   INCISION AND DRAINAGE ABSCESS Left 09/28/2019   Procedure: INCISION AND DRAINAGE VULVAR ABCESS;  Surgeon: Tilda Burrow, MD;  Location: [facility] OR;  Service: Gynecology;   Laterality: Left;   INCISION AND DRAINAGE PERIRECTAL ABSCESS Right 08/18/2013   Procedure: IRRIGATION AND DEBRIDEMENT GLUTEAL ABSCESS;  Surgeon: Axel Filler, MD;  Location: [facility] OR;  Service: General;  Laterality: Right;   INCISION AND DRAINAGE PERIRECTAL ABSCESS Right 09/19/2013   Procedure: IRRIGATION AND DEBRIDEMENT RIGHT GLUTEAL AND LABIAL ABSCESSES;  Surgeon: Axel Filler, MD;  Location: [facility] OR;  Service: General;  Laterality: Right;   INCISION AND DRAINAGE PERIRECTAL ABSCESS Right 09/24/2013   Procedure: IRRIGATION AND DEBRIDEMENT PERIRECTAL ABSCESS;  Surgeon: Cherylynn Ridges, MD;  Location: [facility] OR;  Service: General;  Laterality: Right;    Family History  Problem Relation Age of Onset   Asthma Mother    Carpal tunnel syndrome Mother    Gout Father    Diabetes Paternal Grandmother    Anesthesia problems Neg Hx     SOCIAL HISTORY: Social History   Socioeconomic History   Marital status: Single    Spouse name: Not on file   Number of children: 0   Years of education: 11th grade   Highest education level: Not on file  Occupational History   Occupation: unemployed    Comment: has never worked  Tobacco Use   Smoking status: Never    Passive exposure: Never   Smokeless tobacco: Never  Vaping Use   Vaping status: Never Used  Substance and Sexual Activity   Alcohol use: Not Currently   Drug use: No   Sexual activity: Yes  Birth control/protection: None  Other Topics Concern   Not on file  Social History Narrative   Patient lives in [place] mother lives in [place].  Unemployed.  Previously worked for a Customer service manager.  Completed 11 grade working on [employer]. Patient 3 brothers    Social Determinants of Health   Financial Resource Strain: High Risk (11/22/2022)   Received from [facility]   Overall Financial Resource Strain (CARDIA)    Difficulty of Paying Living Expenses: Hard  Food Insecurity: No Food Insecurity (01/25/2023)   Received from [facility]    Hunger Vital Sign    Worried About Running Out of Food in the Last Year: Never true    Ran Out of Food in the Last Year: Never true  Recent Concern: Food Insecurity - Food Insecurity Present (11/22/2022)   Received from [facility]   Hunger Vital Sign    Worried About Running Out of Food in the Last Year: Often true    Ran Out of Food in the Last Year: Often true  Transportation Needs: Patient Unable To Answer (01/25/2023)   Received from [facility] - Transportation    Lack of Transportation (Medical): Patient unable to answer    Lack of Transportation (Non-Medical): Patient unable to answer  Physical Activity: Insufficiently Active (03/02/2022)   Received from [facility], [facility]   Exercise Vital Sign    Days of Exercise per Week: 2 days    Minutes of Exercise per Session: 10 min  Stress: Stress Concern Present (05/07/2022)   Received from [facility], [facility] - Occupational Stress Questionnaire    Feeling of Stress : To some extent  Social Connections: Moderately Integrated (03/02/2022)   Received from [facility], [facility]   Social Network    How would you rate your social network (family, work, friends)?: Adequate participation with social networks  Intimate Partner Violence: Patient Declined (09/08/2022)   Humiliation, Afraid, Rape, and Kick questionnaire    Fear of Current or Ex-Partner: Patient declined    Emotionally Abused: Patient declined    Physically Abused: Patient declined    Sexually Abused: Patient declined    Allergies  Allergen Reactions   Cephalexin Anaphylaxis    Has gotten ceftriaxone in the past    Morphine Itching   Penicillins Hives and Rash    Has patient had a PCN reaction causing immediate rash, facial/tongue/throat swelling, SOB or lightheadedness with hypotension: Yes Has patient had a PCN reaction causing severe rash involving mucus membranes or skin necrosis:  No Has patient had a PCN reaction that required hospitalization: Yes Has patient had a PCN reaction occurring within the last 10 years: No Spoke with pt - childhood hives told by mom, tried no pcns since, doesn't remember reaction herself    Benadryl [Diphenhydramine] Itching   Doxycycline Itching    Current Outpatient Medications  Medication Sig Dispense Refill   acetaminophen (TYLENOL) 500 MG tablet Take 1,000 mg by mouth every 6 (six) hours as needed for mild pain or moderate pain.     amLODipine (NORVASC) 10 MG tablet Take 1 tablet (10 mg total) by mouth daily. 30 tablet 2   blood glucose meter kit and supplies KIT Dispense based on patient and insurance preference. Use up to four times daily as directed. (FOR ICD-9 250.00, 250.01). 1 each 0   Blood Pressure Monitoring (BLOOD PRESSURE KIT) DEVI 1 Device by Does not  apply route as needed. 1 each 0   furosemide (LASIX) 80 MG tablet Take 80 mg by mouth 2 (two) times daily.     insulin aspart (NOVOLOG) 100 UNIT/ML injection Max daily 50 units .Uses insulin pump 50 mL 0   Insulin Disposable Pump (OMNIPOD 5 G6 INTRO, GEN 5,) KIT Change pods every 3 days 1 kit 0   Insulin Disposable Pump (OMNIPOD 5 G6 POD, GEN 5,) MISC CHANGE EVERY 3 DAYS 30 each 1   Insulin Pen Needle 32G X 4 MM MISC Use as directed 200 each 0   lamoTRIgine (LAMICTAL) 100 MG tablet Take 100 mg by mouth daily.     LORazepam (ATIVAN) 0.5 MG tablet Take 0.5 mg by mouth daily as needed for anxiety.     losartan (COZAAR) 50 MG tablet Take 50 mg by mouth daily.     oxyCODONE-acetaminophen (PERCOCET/ROXICET) 5-325 MG tablet Take 1 tablet by mouth every 4 (four) hours as needed for severe pain. 15 tablet 0   rosuvastatin (CRESTOR) 40 MG tablet Take 40 mg by mouth daily.     sevelamer carbonate (RENVELA) 800 MG tablet Take 2,400 mg by mouth 3 (three) times daily with meals.     SUMAtriptan (IMITREX) 50 MG tablet Take 50 mg by mouth every 2 (two) hours as needed for migraine or  headache.     carvedilol (COREG) 25 MG tablet Take 1 tablet (25 mg total) by mouth 2 (two) times daily. (Patient not taking: Reported on 02/12/2023) 60 tablet 2   Continuous Blood Gluc Sensor (DEXCOM G6 SENSOR) MISC Change sensors every 10 days 9 each 3   Continuous Blood Gluc Transmit (DEXCOM G6 TRANSMITTER) MISC Inject 1 Device into the skin as directed. Use to check blood sugar daily 1 each 3   Continuous Glucose Sensor (DEXCOM G7 SENSOR) MISC Change sensors every 10 days 3 each 3   No current facility-administered medications for this visit.    REVIEW OF SYSTEMS:  [X]  denotes positive finding, [ ]  denotes negative finding Cardiac  Comments:  Chest pain or chest pressure:    Shortness of breath upon exertion:    Short of breath when lying flat:    Irregular heart rhythm:        Vascular    Pain in calf, thigh, or hip brought on by ambulation:    Pain in feet at night that wakes you up from your sleep:     Blood clot in your veins:    Leg swelling:         Pulmonary    Oxygen at home:    Productive cough:     Wheezing:         Neurologic    Sudden weakness in arms or legs:     Sudden numbness in arms or legs:     Sudden onset of difficulty speaking or slurred speech:    Temporary loss of vision in one eye:     Problems with dizziness:         Gastrointestinal    Blood in stool:     Vomited blood:         Genitourinary    Burning when urinating:     Blood in urine:        Psychiatric    Major depression:         Hematologic    Bleeding problems:    Problems with blood clotting too easily:        Skin  Rashes or ulcers:        Constitutional    Fever or chills:      PHYSICAL EXAM: Vitals:   02/12/23 1451  BP: (!) 145/92  Pulse: (!) 107  Resp: 18  Temp: 98.2 F (36.8 C)  TempSrc: Temporal  SpO2: 98%  Weight: 129 lb 12.8 oz (58.9 kg)  Height: 5\' 3"  (1.6 m)    GENERAL: The patient is a well-nourished female, in no acute distress. The vital signs  are documented above. CARDIAC: There is a regular rate and rhythm.  VASCULAR:  Right arm AV graft with excellent thrill Right radial pulse weakly palpable at the wrist  DATA:   N/A  Assessment/Plan:  32 y.o. female, with end-stage renal disease that presents for evaluation of peripheral access.  Patient states the veins in her arms are small and they are having a hard time with lab draws.  Well-known to our practice and previously underwent right upper arm AV graft on 07/06/2022.  She states the right arm graft is working well.  We were unclear on her visit today so we did reach out to Dr. Garnett Farm office regarding how we could help today.  They are requesting Port-A-Cath due to difficulty with IV access.  We let them know that our office does not place Port-A-Cath and they need to put a referral to IR for evaluation.  We are available as needed in the future.  Right arm AV graft has a great thrill today and worked well in dialysis.   Cephus Shelling, MD [facility] Office: [phone]

## 2023-02-17 ENCOUNTER — Other Ambulatory Visit: Payer: Self-pay | Admitting: Internal Medicine

## 2023-02-19 ENCOUNTER — Other Ambulatory Visit: Payer: Self-pay | Admitting: Otolaryngology

## 2023-02-19 ENCOUNTER — Ambulatory Visit
Admission: RE | Admit: 2023-02-19 | Discharge: 2023-02-19 | Disposition: A | Discharge status: Home / Self Care | Payer: Medicaid Other | Source: Ambulatory Visit | Attending: Otolaryngology | Admitting: Otolaryngology

## 2023-02-19 DIAGNOSIS — R059 Cough, unspecified: Secondary | ICD-10-CM

## 2023-03-15 ENCOUNTER — Other Ambulatory Visit: Payer: Self-pay | Admitting: Nephrology

## 2023-03-15 DIAGNOSIS — R188 Other ascites: Secondary | ICD-10-CM

## 2023-03-20 ENCOUNTER — Ambulatory Visit: Payer: Medicaid Other | Attending: Nephrology

## 2023-04-01 ENCOUNTER — Ambulatory Visit
Admission: RE | Admit: 2023-04-01 | Discharge: 2023-04-01 | Disposition: A | Discharge status: Home / Self Care | Payer: Medicaid Other | Source: Ambulatory Visit | Attending: Nephrology | Admitting: Nephrology

## 2023-04-01 ENCOUNTER — Other Ambulatory Visit: Payer: Self-pay | Admitting: Nephrology

## 2023-04-01 DIAGNOSIS — R188 Other ascites: Secondary | ICD-10-CM

## 2023-04-01 NOTE — Progress Notes (Signed)
Patient presents for therapeutic and diagnostic paracentesis. [place] limited abdomen shows small amount of peritoneal fluid. After discussion of the risks versus benefits of the procedure the patient decided to defer paracentesis at this time. Procedure not performed.

## 2023-05-03 ENCOUNTER — Other Ambulatory Visit (INDEPENDENT_AMBULATORY_CARE_PROVIDER_SITE_OTHER): Payer: Self-pay | Admitting: Nurse Practitioner

## 2023-05-03 DIAGNOSIS — N186 End stage renal disease: Secondary | ICD-10-CM

## 2023-05-06 ENCOUNTER — Encounter (INDEPENDENT_AMBULATORY_CARE_PROVIDER_SITE_OTHER): Payer: Medicaid Other

## 2023-05-06 ENCOUNTER — Encounter (INDEPENDENT_AMBULATORY_CARE_PROVIDER_SITE_OTHER): Payer: Medicaid Other | Admitting: Nurse Practitioner

## 2023-05-15 ENCOUNTER — Inpatient Hospital Stay (HOSPITAL_BASED_OUTPATIENT_CLINIC_OR_DEPARTMENT_OTHER): Admission: RE | Admit: 2023-05-15 | Payer: Medicaid Other | Source: Ambulatory Visit | Admitting: Radiology

## 2023-05-15 DIAGNOSIS — Z1231 Encounter for screening mammogram for malignant neoplasm of breast: Secondary | ICD-10-CM

## 2023-05-20 ENCOUNTER — Emergency Department: Payer: Medicaid Other

## 2023-05-20 ENCOUNTER — Other Ambulatory Visit: Payer: Self-pay

## 2023-05-20 ENCOUNTER — Inpatient Hospital Stay
Admission: EM | Admit: 2023-05-20 | Discharge: 2023-05-21 | DRG: 637 | Disposition: A | Discharge status: Home / Self Care | Payer: Medicaid Other | Attending: Internal Medicine | Admitting: Internal Medicine

## 2023-05-20 DIAGNOSIS — Z881 Allergy status to other antibiotic agents status: Secondary | ICD-10-CM

## 2023-05-20 DIAGNOSIS — E1159 Type 2 diabetes mellitus with other circulatory complications: Secondary | ICD-10-CM | POA: Diagnosis present

## 2023-05-20 DIAGNOSIS — D631 Anemia in chronic kidney disease: Secondary | ICD-10-CM | POA: Diagnosis present

## 2023-05-20 DIAGNOSIS — N2581 Secondary hyperparathyroidism of renal origin: Secondary | ICD-10-CM | POA: Diagnosis present

## 2023-05-20 DIAGNOSIS — Z992 Dependence on renal dialysis: Secondary | ICD-10-CM

## 2023-05-20 DIAGNOSIS — E1022 Type 1 diabetes mellitus with diabetic chronic kidney disease: Secondary | ICD-10-CM | POA: Diagnosis present

## 2023-05-20 DIAGNOSIS — I5043 Acute on chronic combined systolic (congestive) and diastolic (congestive) heart failure: Secondary | ICD-10-CM

## 2023-05-20 DIAGNOSIS — Z9641 Presence of insulin pump (external) (internal): Secondary | ICD-10-CM | POA: Diagnosis present

## 2023-05-20 DIAGNOSIS — Z833 Family history of diabetes mellitus: Secondary | ICD-10-CM | POA: Diagnosis not present

## 2023-05-20 DIAGNOSIS — Z885 Allergy status to narcotic agent status: Secondary | ICD-10-CM

## 2023-05-20 DIAGNOSIS — I5020 Unspecified systolic (congestive) heart failure: Secondary | ICD-10-CM | POA: Insufficient documentation

## 2023-05-20 DIAGNOSIS — Z888 Allergy status to other drugs, medicaments and biological substances status: Secondary | ICD-10-CM | POA: Diagnosis not present

## 2023-05-20 DIAGNOSIS — N186 End stage renal disease: Secondary | ICD-10-CM

## 2023-05-20 DIAGNOSIS — Z880 Allergy status to penicillin: Secondary | ICD-10-CM

## 2023-05-20 DIAGNOSIS — I5023 Acute on chronic systolic (congestive) heart failure: Secondary | ICD-10-CM | POA: Insufficient documentation

## 2023-05-20 DIAGNOSIS — I420 Dilated cardiomyopathy: Secondary | ICD-10-CM | POA: Diagnosis present

## 2023-05-20 DIAGNOSIS — E1069 Type 1 diabetes mellitus with other specified complication: Secondary | ICD-10-CM | POA: Diagnosis present

## 2023-05-20 DIAGNOSIS — I5022 Chronic systolic (congestive) heart failure: Secondary | ICD-10-CM | POA: Diagnosis present

## 2023-05-20 DIAGNOSIS — E1165 Type 2 diabetes mellitus with hyperglycemia: Secondary | ICD-10-CM

## 2023-05-20 DIAGNOSIS — E1010 Type 1 diabetes mellitus with ketoacidosis without coma: Secondary | ICD-10-CM | POA: Diagnosis present

## 2023-05-20 DIAGNOSIS — Z79899 Other long term (current) drug therapy: Secondary | ICD-10-CM | POA: Diagnosis not present

## 2023-05-20 DIAGNOSIS — I5042 Chronic combined systolic (congestive) and diastolic (congestive) heart failure: Secondary | ICD-10-CM

## 2023-05-20 DIAGNOSIS — Z1152 Encounter for screening for COVID-19: Secondary | ICD-10-CM | POA: Diagnosis not present

## 2023-05-20 DIAGNOSIS — R739 Hyperglycemia, unspecified: Principal | ICD-10-CM

## 2023-05-20 DIAGNOSIS — Z825 Family history of asthma and other chronic lower respiratory diseases: Secondary | ICD-10-CM | POA: Diagnosis not present

## 2023-05-20 DIAGNOSIS — Z794 Long term (current) use of insulin: Secondary | ICD-10-CM

## 2023-05-20 DIAGNOSIS — E785 Hyperlipidemia, unspecified: Secondary | ICD-10-CM | POA: Diagnosis present

## 2023-05-20 DIAGNOSIS — I10 Essential (primary) hypertension: Secondary | ICD-10-CM | POA: Diagnosis present

## 2023-05-20 DIAGNOSIS — I132 Hypertensive heart and chronic kidney disease with heart failure and with stage 5 chronic kidney disease, or end stage renal disease: Secondary | ICD-10-CM | POA: Diagnosis present

## 2023-05-20 DIAGNOSIS — R52 Pain, unspecified: Secondary | ICD-10-CM

## 2023-05-20 LAB — RESP PANEL BY RT-PCR (RSV, FLU A&B, COVID)  RVPGX2
Influenza A by PCR: NEGATIVE
Influenza B by PCR: NEGATIVE
Resp Syncytial Virus by PCR: NEGATIVE
SARS Coronavirus 2 by RT PCR: NEGATIVE

## 2023-05-20 LAB — CBG MONITORING, ED
Glucose-Capillary: 103 mg/dL — ABNORMAL HIGH (ref 70–99)
Glucose-Capillary: 104 mg/dL — ABNORMAL HIGH (ref 70–99)
Glucose-Capillary: 109 mg/dL — ABNORMAL HIGH (ref 70–99)
Glucose-Capillary: 110 mg/dL — ABNORMAL HIGH (ref 70–99)
Glucose-Capillary: 135 mg/dL — ABNORMAL HIGH (ref 70–99)
Glucose-Capillary: 140 mg/dL — ABNORMAL HIGH (ref 70–99)
Glucose-Capillary: 156 mg/dL — ABNORMAL HIGH (ref 70–99)
Glucose-Capillary: 160 mg/dL — ABNORMAL HIGH (ref 70–99)
Glucose-Capillary: 162 mg/dL — ABNORMAL HIGH (ref 70–99)
Glucose-Capillary: 176 mg/dL — ABNORMAL HIGH (ref 70–99)
Glucose-Capillary: 227 mg/dL — ABNORMAL HIGH (ref 70–99)
Glucose-Capillary: 390 mg/dL — ABNORMAL HIGH (ref 70–99)
Glucose-Capillary: 442 mg/dL — ABNORMAL HIGH (ref 70–99)
Glucose-Capillary: 553 mg/dL (ref 70–99)
Glucose-Capillary: 571 mg/dL (ref 70–99)
Glucose-Capillary: 589 mg/dL (ref 70–99)
Glucose-Capillary: 97 mg/dL (ref 70–99)
Glucose-Capillary: >600 mg/dL (ref 70–99)
Glucose-Capillary: >600 mg/dL (ref 70–99)
Glucose-Capillary: >600 mg/dL (ref 70–99)
Glucose-Capillary: >600 mg/dL (ref 70–99)
Glucose-Capillary: >600 mg/dL (ref 70–99)
Glucose-Capillary: >600 mg/dL (ref 70–99)
Glucose-Capillary: >600 mg/dL (ref 70–99)

## 2023-05-20 LAB — BASIC METABOLIC PANEL
Anion gap: 13 (ref 5–15)
Anion gap: 13 (ref 5–15)
Anion gap: 15 (ref 5–15)
BUN: 59 mg/dL — ABNORMAL HIGH (ref 6–20)
BUN: 59 mg/dL — ABNORMAL HIGH (ref 6–20)
BUN: 57 mg/dL — ABNORMAL HIGH (ref 6–20)
CO2: 21 mmol/L — ABNORMAL LOW (ref 22–32)
CO2: 21 mmol/L — ABNORMAL LOW (ref 22–32)
CO2: 18 mmol/L — ABNORMAL LOW (ref 22–32)
Calcium: 8.3 mg/dL — ABNORMAL LOW (ref 8.9–10.3)
Calcium: 7.9 mg/dL — ABNORMAL LOW (ref 8.9–10.3)
Calcium: 8 mg/dL — ABNORMAL LOW (ref 8.9–10.3)
Chloride: 97 mmol/L — ABNORMAL LOW (ref 98–111)
Chloride: 95 mmol/L — ABNORMAL LOW (ref 98–111)
Chloride: 95 mmol/L — ABNORMAL LOW (ref 98–111)
Creatinine, Ser: 8.29 mg/dL — ABNORMAL HIGH (ref 0.44–1.00)
Creatinine, Ser: 8.52 mg/dL — ABNORMAL HIGH (ref 0.44–1.00)
Creatinine, Ser: 8.56 mg/dL — ABNORMAL HIGH (ref 0.44–1.00)
GFR, Estimated: 6 mL/min — ABNORMAL LOW (ref >60)
GFR, Estimated: 6 mL/min — ABNORMAL LOW (ref >60)
GFR, Estimated: 6 mL/min — ABNORMAL LOW (ref >60)
Glucose, Bld: 242 mg/dL — ABNORMAL HIGH (ref 70–99)
Glucose, Bld: 496 mg/dL — ABNORMAL HIGH (ref 70–99)
Glucose, Bld: 125 mg/dL — ABNORMAL HIGH (ref 70–99)
Potassium: 4 mmol/L (ref 3.5–5.1)
Potassium: 3.7 mmol/L (ref 3.5–5.1)
Potassium: 3.7 mmol/L (ref 3.5–5.1)
Sodium: 131 mmol/L — ABNORMAL LOW (ref 135–145)
Sodium: 128 mmol/L — ABNORMAL LOW (ref 135–145)
Sodium: 128 mmol/L — ABNORMAL LOW (ref 135–145)

## 2023-05-20 LAB — COMPREHENSIVE METABOLIC PANEL
ALT: 25 U/L (ref 0–44)
AST: 36 U/L (ref 15–41)
Albumin: 4.2 g/dL (ref 3.5–5.0)
Alkaline Phosphatase: 207 U/L — ABNORMAL HIGH (ref 38–126)
Anion gap: 16 — ABNORMAL HIGH (ref 5–15)
BUN: 58 mg/dL — ABNORMAL HIGH (ref 6–20)
CO2: 18 mmol/L — ABNORMAL LOW (ref 22–32)
Calcium: 8.4 mg/dL — ABNORMAL LOW (ref 8.9–10.3)
Chloride: 88 mmol/L — ABNORMAL LOW (ref 98–111)
Creatinine, Ser: 8.29 mg/dL — ABNORMAL HIGH (ref 0.44–1.00)
GFR, Estimated: 6 mL/min — ABNORMAL LOW (ref >60)
Glucose, Bld: 921 mg/dL (ref 70–99)
Potassium: 5.3 mmol/L — ABNORMAL HIGH (ref 3.5–5.1)
Sodium: 122 mmol/L — ABNORMAL LOW (ref 135–145)
Total Bilirubin: 1.4 mg/dL — ABNORMAL HIGH (ref 0.0–1.2)
Total Protein: 7.4 g/dL (ref 6.5–8.1)

## 2023-05-20 LAB — HCG, QUANTITATIVE, PREGNANCY: hCG, Beta Chain, Quant, S: 1 m[IU]/mL (ref <5)

## 2023-05-20 LAB — CBC
HCT: 47.7 % — ABNORMAL HIGH (ref 36.0–46.0)
Hemoglobin: 14.4 g/dL (ref 12.0–15.0)
MCH: 25.9 pg — ABNORMAL LOW (ref 26.0–34.0)
MCHC: 30.2 g/dL (ref 30.0–36.0)
MCV: 85.9 fL (ref 80.0–100.0)
Platelets: 165 10*3/uL (ref 150–400)
RBC: 5.55 MIL/uL — ABNORMAL HIGH (ref 3.87–5.11)
RDW: 14.8 % (ref 11.5–15.5)
WBC: 6.8 10*3/uL (ref 4.0–10.5)
nRBC: 0 % (ref 0.0–0.2)

## 2023-05-20 LAB — OSMOLALITY: Osmolality: 334 mosm/kg (ref 275–295)

## 2023-05-20 LAB — BLOOD GAS, VENOUS

## 2023-05-20 LAB — BETA-HYDROXYBUTYRIC ACID: Beta-Hydroxybutyric Acid: 0.17 mmol/L (ref 0.05–0.27)

## 2023-05-20 LAB — LIPASE, BLOOD: Lipase: 24 U/L (ref 11–51)

## 2023-05-20 LAB — CK: Total CK: 199 U/L (ref 38–234)

## 2023-05-20 MED ORDER — DEXTROSE IN LACTATED RINGERS 5 % IV SOLN: INTRAVENOUS | Status: DC

## 2023-05-20 MED ORDER — DEXTROSE 50 % IV SOLN
0.0000 mL | INTRAVENOUS | Status: DC | PRN
Start: 2023-05-20 — End: 2023-05-21

## 2023-05-20 MED ORDER — ROSUVASTATIN CALCIUM 20 MG PO TABS
40.0000 mg | ORAL_TABLET | Freq: Every day | ORAL | Status: DC
  Administered 2023-05-20: 40 mg via ORAL
  Filled 2023-05-20 (×2): qty 2

## 2023-05-20 MED ORDER — ONDANSETRON HCL 4 MG/2ML IJ SOLN
4.0000 mg | Freq: Four times a day (QID) | INTRAMUSCULAR | Status: DC | PRN
  Administered 2023-05-20: 4 mg via INTRAVENOUS
  Filled 2023-05-20: qty 2

## 2023-05-20 MED ORDER — INSULIN REGULAR(HUMAN) IN NACL 100-0.9 UT/100ML-% IV SOLN
INTRAVENOUS | Status: DC
  Administered 2023-05-20: 1.4 [IU]/h via INTRAVENOUS
  Administered 2023-05-20: 0.3 [IU]/h via INTRAVENOUS
  Administered 2023-05-20: 5.5 [IU]/h via INTRAVENOUS
  Administered 2023-05-20: 1.2 [IU]/h via INTRAVENOUS
  Administered 2023-05-21: 0.6 [IU]/h via INTRAVENOUS
  Filled 2023-05-20 (×2): qty 100

## 2023-05-20 MED ORDER — FENTANYL CITRATE PF 50 MCG/ML IJ SOSY
25.0000 ug | PREFILLED_SYRINGE | Freq: Once | INTRAMUSCULAR | Status: AC
  Administered 2023-05-20: 25 ug via INTRAVENOUS
  Filled 2023-05-20: qty 1

## 2023-05-20 MED ORDER — LACTATED RINGERS IV BOLUS
500.0000 mL | Freq: Once | INTRAVENOUS | Status: AC
  Administered 2023-05-20: 500 mL via INTRAVENOUS

## 2023-05-20 MED ORDER — HYDROXYZINE HCL 10 MG PO TABS
10.0000 mg | ORAL_TABLET | Freq: Once | ORAL | Status: AC
  Administered 2023-05-20: 10 mg via ORAL
  Filled 2023-05-20: qty 1

## 2023-05-20 MED ORDER — LOSARTAN POTASSIUM 50 MG PO TABS
50.0000 mg | ORAL_TABLET | Freq: Every day | ORAL | Status: DC
  Administered 2023-05-20 – 2023-05-21 (×2): 50 mg via ORAL
  Filled 2023-05-20 (×2): qty 1

## 2023-05-20 MED ORDER — CARVEDILOL 25 MG PO TABS
25.0000 mg | ORAL_TABLET | Freq: Two times a day (BID) | ORAL | Status: DC
  Administered 2023-05-20 (×2): 25 mg via ORAL
  Filled 2023-05-20 (×2): qty 1

## 2023-05-20 MED ORDER — LACTATED RINGERS IV SOLN: INTRAVENOUS | Status: DC

## 2023-05-20 MED ORDER — DEXTROSE-SODIUM CHLORIDE 5-0.9 % IV SOLN
2000.0000 mL | Freq: Every day | INTRAVENOUS | Status: AC
  Administered 2023-05-20: 2000 mL via INTRAVENOUS

## 2023-05-20 MED ORDER — SODIUM CHLORIDE 0.9 % IV BOLUS
500.0000 mL | Freq: Once | INTRAVENOUS | Status: AC
  Administered 2023-05-20: 500 mL via INTRAVENOUS

## 2023-05-20 MED ORDER — HYDROXYZINE HCL 10 MG PO TABS
10.0000 mg | ORAL_TABLET | Freq: Three times a day (TID) | ORAL | Status: DC | PRN
  Administered 2023-05-20 – 2023-05-21 (×2): 10 mg via ORAL
  Filled 2023-05-20 (×4): qty 1

## 2023-05-20 MED ORDER — DEXTROSE 5 % AND 0.9 % NACL IV BOLUS: 2000.0000 mL | Freq: Once | INTRAVENOUS | Status: DC

## 2023-05-20 MED ORDER — OXYCODONE-ACETAMINOPHEN 5-325 MG PO TABS
1.0000 | ORAL_TABLET | ORAL | Status: DC | PRN
  Administered 2023-05-20 – 2023-05-21 (×2): 1 via ORAL
  Filled 2023-05-20 (×2): qty 1

## 2023-05-20 MED ORDER — HEPARIN SODIUM (PORCINE) 5000 UNIT/ML IJ SOLN
5000.0000 [IU] | Freq: Three times a day (TID) | INTRAMUSCULAR | Status: DC
  Administered 2023-05-20 – 2023-05-21 (×4): 5000 [IU] via SUBCUTANEOUS
  Filled 2023-05-20 (×4): qty 1

## 2023-05-20 MED ORDER — HYDRALAZINE HCL 20 MG/ML IJ SOLN: 10.0000 mg | Freq: Four times a day (QID) | INTRAMUSCULAR | Status: DC | PRN

## 2023-05-20 MED ORDER — CHLORHEXIDINE GLUCONATE CLOTH 2 % EX PADS
6.0000 | MEDICATED_PAD | Freq: Every day | CUTANEOUS | Status: DC
  Filled 2023-05-20 (×2): qty 6

## 2023-05-20 NOTE — ED Triage Notes (Signed)
Arrives via EMS  Hyperglycemia has been going on for 3 days  Sx: N/V pain all over Takes insulin for diabetes, states she has been taking insulin Vitals: BP 148/94, HR 80, 94% RA, BG: HIGH  Hx: HTN and DM, Dialysis Pt

## 2023-05-20 NOTE — Assessment & Plan Note (Signed)
Suspect secondary to dehydration CK was normal Pain control Follow-up respiratory viral panel to evaluate for viral illness

## 2023-05-20 NOTE — Assessment & Plan Note (Signed)
Nephrology consulted for continuation of dialysis and fluid control

## 2023-05-20 NOTE — Assessment & Plan Note (Addendum)
Dilated cardiomyopathy EF 35% May 2024 Patient clinically dry Continue losartan and carvedilol and Lasix Monitor for fluid overload in view of IV fluid resuscitation and treatment of hyperglycemic crisis Daily weights

## 2023-05-20 NOTE — Assessment & Plan Note (Addendum)
Patient meeting criteria for HHS than DKA pending osmolality.  Beta hydroxybutyric acid normal, normal venous pH and very high glucose of 921, serum osmolality pending Fluid bolus with careful monitoring due to dialysis status Continue insulin infusion per Endo tool

## 2023-05-20 NOTE — ED Provider Notes (Signed)
[facility] Provider Note    Event Date/Time   First MD Initiated Contact with Patient 05/20/23 [phone]     (approximate)   History   Body aches and high blood sugar  HPI  Ashley Freeman is a 33 y.o. female history of type 1 diabetes end-stage renal disease hypertension dilated cardiomyopathy   Patient reports that for about 3 days now her blood sugars been running very high.  She used her insulin last this morning.  She reports that she has been having feelings of dry mouth and her body has been aching throughout the day  No fevers no specific pain but she reports everywhere on her body hurts to touch.  This has happened before where she has had problems where she had had to go to [facility] and has a history of problems with her blood sugar being high  She reports compliance with her insulin regimen  No specific chest pain or difficulty breathing.  She last had hemodialysis on Thursday  Physical Exam   Triage Vital Signs: ED Triage Vitals  Encounter Vitals Group     BP      Systolic BP Percentile      Diastolic BP Percentile      Pulse      Resp      Temp      Temp src      SpO2      Weight      Height      Head Circumference      Peak Flow      Pain Score      Pain Loc      Pain Education      Exclude from Growth Chart    Vitals:   05/20/23 0341 05/20/23 0435  BP:  (!) 162/106  Pulse: 76 79  Resp: 20 (!) 21  Temp: 97.6 F (36.4 C)   SpO2: 97% 98%     Most recent vital signs: Vitals:   05/20/23 0341 05/20/23 0435  BP:  (!) 162/106  Pulse: 76 79  Resp: 20 (!) 21  Temp: 97.6 F (36.4 C)   SpO2: 97% 98%     General: Awake, no distress.  She appears in some pain reporting body aches.  She reports that it is tender to touch her anywhere on her extremities CV:  Good peripheral perfusion.  Normal tones Resp:  Normal effort.  Clear bilateral with normal oxygen saturation and work of breathing except she is noted  to be slightly tachypneic Abd:  Abdomen is minimally chest slightly distended.  No focal tenderness or discomfort noted. Other:     ED Results / Procedures / Treatments   Labs (all labs ordered are listed, but only abnormal results are displayed) Labs Reviewed  CBC - Abnormal; Notable for the following components:      Result Value   RBC 5.55 (*)    HCT 47.7 (*)    MCH 25.9 (*)    All other components within normal limits  COMPREHENSIVE METABOLIC PANEL - Abnormal; Notable for the following components:   Sodium 122 (*)    Potassium 5.3 (*)    Chloride 88 (*)    CO2 18 (*)    Glucose, Bld 921 (*)    BUN 58 (*)    Creatinine, Ser 8.29 (*)    Calcium 8.4 (*)    Alkaline Phosphatase 207 (*)    Total Bilirubin 1.4 (*)    GFR, Estimated  6 (*)    Anion gap 16 (*)    All other components within normal limits  BLOOD GAS, VENOUS - Abnormal; Notable for the following components:   pO2, Ven 31 (*)    Acid-base deficit 3.1 (*)    All other components within normal limits  CBG MONITORING, ED - Abnormal; Notable for the following components:   Glucose-Capillary >600 (*)    All other components within normal limits  RESP PANEL BY RT-PCR (RSV, FLU A&B, COVID)  RVPGX2  LIPASE, BLOOD  BETA-HYDROXYBUTYRIC ACID  HCG, QUANTITATIVE, PREGNANCY  CK  URINALYSIS, ROUTINE W REFLEX MICROSCOPIC  OSMOLALITY  BASIC METABOLIC PANEL  BASIC METABOLIC PANEL  BASIC METABOLIC PANEL  BASIC METABOLIC PANEL  BASIC METABOLIC PANEL     EKG  Inter by me at 410 heart rate 80 QRS 110 QTc 500 Normal sinus rhythm, no evidence of acute ischemia.  No obvious evidence of acute hyperkalemia.  Borderline prolongation of QT interval   RADIOLOGY     PROCEDURES:  Critical Care performed: Yes, see critical care procedure note(s)  Procedures CRITICAL CARE Performed by: Sharyn Creamer   Total critical care time: 30 minutes  Critical care time was exclusive of separately billable procedures and treating  other patients.  Critical care was necessary to treat or prevent imminent or life-threatening deterioration.  Critical care was time spent personally by me on the following activities: development of treatment plan with patient and/or surrogate as well as nursing, discussions with consultants, evaluation of patient's response to treatment, examination of patient, obtaining history from patient or surrogate, ordering and performing treatments and interventions, ordering and review of laboratory studies, ordering and review of radiographic studies, pulse oximetry and re-evaluation of patient's condition.   MEDICATIONS ORDERED IN ED: Medications  insulin regular, human (MYXREDLIN) 100 units/ 100 mL infusion (has no administration in time range)  lactated ringers infusion (has no administration in time range)  dextrose 5 % in lactated ringers infusion (has no administration in time range)  dextrose 50 % solution 0-50 mL (has no administration in time range)  lactated ringers bolus 500 mL (has no administration in time range)  sodium chloride 0.9 % bolus 500 mL (500 mLs Intravenous New Bag/Given 05/20/23 0400)  hydrOXYzine (ATARAX) tablet 10 mg (10 mg Oral Given 05/20/23 0438)  fentaNYL (SUBLIMAZE) injection 25 mcg (25 mcg Intravenous Given 05/20/23 0433)     IMPRESSION / MDM / ASSESSMENT AND PLAN / ED COURSE  I reviewed the triage vital signs and the nursing notes.                              Differential diagnosis includes but is not limited to hyperglycemia dehydration, DKA, diabetic crisis but other considerations given myalgia, rhabdomyolysis, medication effect etc.  She has no evidence of acute volume overload.  Reports her last hemodialysis session was Thursday as planned  She is alert in no obvious distress but does advise, body aches.  No fevers no cough no obvious infectious symptoms.  No acute cardiac symptoms or associated chest pain  EMS reports glucometer reading high.  Nursing in  the ED check glucose advise greater than 600.  ----------------------------------------- 5:03 AM on 05/20/2023 ----------------------------------------- Labs consistent with hyperglycemic crisis, suspicious for mild DKA versus HHS.  Serum awesome's pending.  Patient's presentation is most consistent with acute presentation with potential threat to life or bodily function.   The patient is on the cardiac monitor to  evaluate for evidence of arrhythmia and/or significant heart rate changes.   Clinical Course as of 05/20/23 0504  Mon May 20, 2023  0446 Correction toSodium 1 accounting for glucose.  Potassium 5.3.  Obvious hyperglycemia.  Beta hydroxybutyrate is normal and CO2 is marginal in the context of potential DKA.  At this time it is not 100% clear to me if the patient is suffering uniquely from DKA or HHS.  pH is appropriate.  No mental status changes, but given the patient's clinical history and type 1 diabetes I would argue it seems that she may be close to having DKA but could potentially also be suffering from HHS. [MQ]  [phone] Placed consult to hospitalist, currently awaiting serum osmolality to guide further treatment as well.  Will consult with the hospitalist regarding most appropriate treatment regimen for her hyperglycemia [MQ]  0501 Patient currently resting.  Reports her pain is improved she is in no acute distress.  I consulted with and patient accepted to hospitalist by Dr. Para March.  Dr. Para March reviewed labs clinical history with me and recommends that we initiate treatment of DKA waiting for hyperglycemic crisis but with cautious use of rehydration fluids. [MQ]  0502 Patient is agreeable and understanding of plan for admission [MQ]    Clinical Course User Index [MQ] Sharyn Creamer, MD     FINAL CLINICAL IMPRESSION(S) / ED DIAGNOSES   Final diagnoses:  Hyperglycemia  Type 1 diabetes mellitus with other specified complication (HCC)     Rx / DC Orders   ED Discharge Orders      None        Note:  This document was prepared using Dragon voice recognition software and may include unintentional dictation errors.   Sharyn Creamer, MD 05/20/23 [phone]

## 2023-05-20 NOTE — Assessment & Plan Note (Deleted)
Dilated cardiomyopathy EF 35% May 2024 Patient clinically dry

## 2023-05-20 NOTE — Inpatient Diabetes Management (Signed)
Inpatient Diabetes Program Recommendations  AACE/ADA: New Consensus Statement on Inpatient Glycemic Control (2025)  Target Ranges:  Prepandial:   less than 140 mg/dL      Peak postprandial:   less than 180 mg/dL (1-2 hours)      Critically ill patients:  140 - 180 mg/dL   Lab Results  Component Value Date   GLUCAP 589 (HH) 05/20/2023   HGBA1C 10.4 (H) 09/08/2022    Review of Glycemic Control  Latest Reference Range & Units 05/20/23 05:57 05/20/23 06:31 05/20/23 07:03 05/20/23 07:39 05/20/23 08:12 05/20/23 08:49 05/20/23 09:22  Glucose-Capillary 70 - 99 mg/dL >161 (HH) >096 (HH) >045 (HH) >600 (HH) >600 (HH) 571 (HH) 589 (HH)   Diabetes history: DM /ESRD Outpatient Diabetes medications:  Novolog 4 units tid with meals (1 unit for every 50 mg/dL >409 mg/dL) Lantus 8 units daily Dexcom G7 Current orders for Inpatient glycemic control:  IV insulin-DKA orders  Inpatient Diabetes Program Recommendations:    Will follow and will talk to patient when appropriate.  Patient is sensitive to insulin doses due to Type 1 DM and ESRD therefore will need conservative insulin doses.  Once acidosis is cleared consider, Semglee 8 units daily, Novolog 0-6 units (very sensitive) q 4 hours and Novolog meal coverage 2 units tid with meals.   Thanks  Lorenza Cambridge, RN, BC-ADM Inpatient Diabetes Coordinator Pager [phone]  (8a-5p)

## 2023-05-20 NOTE — Progress Notes (Signed)
[facility]  ROUNDING NOTE   Subjective:   Ashley Freeman is a 33 year old female with history of type 1 diabetes, hypertension, hyperlipidemia, end-stage renal disease on hemodialysis.  She presents to the emergency department with hyperglycemia and has been admitted for Hyperglycemic crisis due to diabetes mellitus [facility]) [E11.65]  Patient is known to our practice and receives outpatient dialysis treatments at [facility] on a MWF schedule, supervised by Dr. Cherylann Ratel.  Reports her last treatment was on Thursday.  Patient is seen and evaluated at bedside in ED.  Very somnolent.  Labs on ED arrival shows sodium 122, potassium 5.3, serum bicarb 18, glucose 921, BUN 58, creatinine 8.29 with GFR 6.  Respiratory panel negative for influenza, COVID-19, and RSV.  Chest x-ray shows diffuse interstitial and bibasilar airspace opacities.  Patient placed on insulin drip.  We have been consulted to manage dialysis needs.   Objective:  Vital signs in last 24 hours:  Temp:  [97.6 F (36.4 C)-98.1 F (36.7 C)] 98.1 F (36.7 C) (01/20 1142) Pulse Rate:  [76-83] 76 (01/20 1130) Resp:  [15-27] 26 (01/20 1130) BP: (156-179)/(86-106) 161/86 (01/20 1130) SpO2:  [94 %-99 %] 99 % (01/20 1130) Weight:  [60 kg] 60 kg (01/20 0537)  Weight change:  Filed Weights   05/20/23 0537  Weight: 60 kg    Intake/Output: I/O last 3 completed shifts: In: 1000 [IV Piggyback:1000] Out: -    Intake/Output this shift:  Total I/O In: 868.1 [I.V.:868.1] Out: -   Physical Exam: General: NAD, ill-appearing  Head: Normocephalic, atraumatic.   Eyes: Anicteric  Lungs:  Clear to auscultation, normal effort  Heart: Regular rate and rhythm  Abdomen:  Soft, nontender, nondistended  Extremities: Trace peripheral edema.  Neurologic:  somnolent  Skin: No lesions  Access: Right AVG    Basic Metabolic Panel: Recent Labs  Lab 05/20/23 0356 05/20/23 0856  NA 131*  122* 128*  K 4.0   5.3* 3.7  CL 97*  88* 95*  CO2 21*  18* 21*  GLUCOSE 242*  921* 496*  BUN 59*  58* 59*  CREATININE 8.29*  8.29* 8.52*  CALCIUM 8.3*  8.4* 7.9*    Liver Function Tests: Recent Labs  Lab 05/20/23 0356  AST 36  ALT 25  ALKPHOS 207*  BILITOT 1.4*  PROT 7.4  ALBUMIN 4.2   Recent Labs  Lab 05/20/23 0356  LIPASE 24   No results for input(s): "AMMONIA" in the last 168 hours.  CBC: Recent Labs  Lab 05/20/23 0356  WBC 6.8  HGB 14.4  HCT 47.7*  MCV 85.9  PLT 165    Cardiac Enzymes: Recent Labs  Lab 05/20/23 0356  CKTOTAL 199    BNP: Invalid input(s): "POCBNP"  CBG: Recent Labs  Lab 05/20/23 0922 05/20/23 0949 05/20/23 1033 05/20/23 1120 05/20/23 1222  GLUCAP 589* 553* 442* 390* 227*    Microbiology: Results for orders placed or performed during the hospital encounter of 05/20/23  Resp panel by RT-PCR (RSV, Flu A&B, Covid) Anterior Nasal Swab     Status: None   Collection Time: 05/20/23  3:56 AM   Specimen: Anterior Nasal Swab  Result Value Ref Range Status   SARS Coronavirus 2 by RT PCR NEGATIVE NEGATIVE Final    Comment: (NOTE) SARS-CoV-2 target nucleic acids are NOT DETECTED.  The SARS-CoV-2 RNA is generally detectable in upper respiratory specimens during the acute phase of infection. The lowest concentration of SARS-CoV-2 viral copies this assay can detect is 138 copies/mL.  A negative result does not preclude SARS-Cov-2 infection and should not be used as the sole basis for treatment or other patient management decisions. A negative result may occur with  improper specimen collection/handling, submission of specimen other than nasopharyngeal swab, presence of viral mutation(s) within the areas targeted by this assay, and inadequate number of viral copies(<138 copies/mL). A negative result must be combined with clinical observations, patient history, and epidemiological information. The expected result is Negative.  Fact Sheet for  Patients:  BloggerCourse.com  Fact Sheet for Healthcare Providers:  SeriousBroker.it  This test is no t yet approved or cleared by the [place] FDA and  has been authorized for detection and/or diagnosis of SARS-CoV-2 by FDA under an Emergency Use Authorization (EUA). This EUA will remain  in effect (meaning this test can be used) for the duration of the COVID-19 declaration under Section 564(b)(1) of the Act, 21 U.S.C.section 360bbb-3(b)(1), unless the authorization is terminated  or revoked sooner.       Influenza A by PCR NEGATIVE NEGATIVE Final   Influenza B by PCR NEGATIVE NEGATIVE Final    Comment: (NOTE) The Xpert Xpress SARS-CoV-2/FLU/RSV plus assay is intended as an aid in the diagnosis of influenza from Nasopharyngeal swab specimens and should not be used as a sole basis for treatment. Nasal washings and aspirates are unacceptable for Xpert Xpress SARS-CoV-2/FLU/RSV testing.  Fact Sheet for Patients: BloggerCourse.com  Fact Sheet for Healthcare Providers: SeriousBroker.it  This test is not yet approved or cleared by the [place] FDA and has been authorized for detection and/or diagnosis of SARS-CoV-2 by FDA under an Emergency Use Authorization (EUA). This EUA will remain in effect (meaning this test can be used) for the duration of the COVID-19 declaration under Section 564(b)(1) of the Act, 21 U.S.C. section 360bbb-3(b)(1), unless the authorization is terminated or revoked.     Resp Syncytial Virus by PCR NEGATIVE NEGATIVE Final    Comment: (NOTE) Fact Sheet for Patients: BloggerCourse.com  Fact Sheet for Healthcare Providers: SeriousBroker.it  This test is not yet approved or cleared by the [place] FDA and has been authorized for detection and/or diagnosis of SARS-CoV-2 by FDA under an Emergency  Use Authorization (EUA). This EUA will remain in effect (meaning this test can be used) for the duration of the COVID-19 declaration under Section 564(b)(1) of the Act, 21 U.S.C. section 360bbb-3(b)(1), unless the authorization is terminated or revoked.  Performed at [facility], [address] [place], [place] [address]     Coagulation Studies: No results for input(s): "LABPROT", "INR" in the last 72 hours.  Urinalysis: No results for input(s): "COLORURINE", "LABSPEC", "PHURINE", "GLUCOSEU", "HGBUR", "BILIRUBINUR", "KETONESUR", "PROTEINUR", "UROBILINOGEN", "NITRITE", "LEUKOCYTESUR" in the last 72 hours.  Invalid input(s): "APPERANCEUR"    Imaging: DG Chest Port 1 View Result Date: 05/20/2023 CLINICAL DATA:  Nausea and vomiting with pain. EXAM: PORTABLE CHEST 1 VIEW COMPARISON:  02/19/2023 FINDINGS: Low volume film. The cardio pericardial silhouette is enlarged. Interstitial markings are diffusely coarsened with chronic features. Diffuse interstitial opacity with subtle airspace disease in the lower lungs. No acute bony abnormality. Telemetry leads overlie the chest. IMPRESSION: Low volume film with diffuse interstitial and basilar airspace opacities. Findings may reflect infectious/inflammatory etiology although pulmonary edema not excluded. Electronically Signed   By: Kennith Center M.D.   On: 05/20/2023 06:14     Medications:    dextrose 5% lactated ringers 125 mL/hr at 05/20/23 1343   dextrose 5% lactated ringers 125 mL/hr at 05/20/23 1343  insulin Stopped (05/20/23 1342)   lactated ringers Stopped (05/20/23 1240)    carvedilol  25 mg Oral BID WC   Chlorhexidine Gluconate Cloth  6 each Topical Q0600   heparin  5,000 Units Subcutaneous Q8H   losartan  50 mg Oral Daily   rosuvastatin  40 mg Oral Daily   dextrose, hydrALAZINE, hydrOXYzine, ondansetron (ZOFRAN) IV, oxyCODONE-acetaminophen  Assessment/ Plan:  Ms. HALAYNA BANDT is a 33 y.o.  female   with history of type 1 diabetes, hypertension, hyperlipidemia, end-stage renal disease on hemodialysis   End-stage renal disease on hemodialysis.  Last treatment received on Thursday.  Patient missed treatment on Saturday.  Will provide short dialysis treatment today.  Patient will receive treatment again tomorrow to maintain outpatient schedule.  2.  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 Home regimen includes NovoLog and Lantus.  Blood sugar greater then 900 on ED arrival.  Patient placed on insulin drip.  3. Anemia of chronic kidney disease Lab Results  Component Value Date   HGB 14.4 05/20/2023    Hemoglobin within optimal range.  Will monitor for now  4. Secondary Hyperparathyroidism: Without patient labs: PTH 351, phosphorus 3.1, calcium 8.9 on 05/14/2023 Lab Results  Component Value Date   CALCIUM 7.9 (L) 05/20/2023   CAION 0.95 (L) 01/05/2023   PHOS 6.4 (H) 09/08/2022    Will continue to monitor bone minerals during this admission.   LOS: 0 Roseanne Juenger 1/20/20251:54 PM

## 2023-05-20 NOTE — ED Notes (Signed)
CCMD called to place Pt on Cardiac monitoring.

## 2023-05-20 NOTE — Assessment & Plan Note (Signed)
Continue losartan and carvedilol 

## 2023-05-20 NOTE — ED Notes (Addendum)
HCP notified about abnormal labs and continued increased CBG.

## 2023-05-20 NOTE — Progress Notes (Signed)
  Progress Note   Patient: Ashley Freeman AOZ:[id number] DOB: [birth year] DOA: 05/20/2023     0 DOS: the patient was seen and examined on 05/20/2023   Nonbillable note  Brief hospital course: Patient admitted overnight with hyperglycemia in the setting of DKA/HHS. Glucose levels have improved throughout the day with improvement in mental status.  Acidosis improving however anion gap is still high.  Patient continues to be on insulin drip.  Did have a session of short hemodialysis today.  We will continue current management as outlined in admitting providers H&P this morning by Dr. Para March.   Physical Exam: Vitals:   05/20/23 1542 05/20/23 1600 05/20/23 1700 05/20/23 1727  BP:    125/81  Pulse:  67 69 78  Resp:  20 18 (!) 22  Temp: 97.9 F (36.6 C)     TempSrc: Oral     SpO2:  98% 98% 99%  Weight:      Height:          Latest Ref Rng & Units 05/20/2023    4:18 PM 05/20/2023    8:56 AM 05/20/2023    3:56 AM  BMP  Glucose 70 - 99 mg/dL 962  952  841    324   BUN 6 - 20 mg/dL 57  59  58    59   Creatinine 0.44 - 1.00 mg/dL 4.01  0.27  2.53    6.64   Sodium 135 - 145 mmol/L 128  128  122    131   Potassium 3.5 - 5.1 mmol/L 3.7  3.7  5.3    4.0   Chloride 98 - 111 mmol/L 95  95  88    97   CO2 22 - 32 mmol/L 18  21  18    21    Calcium 8.9 - 10.3 mg/dL 8.0  7.9  8.4    8.3        Latest Ref Rng & Units 05/20/2023    3:56 AM 01/13/2023    5:53 PM 01/05/2023    3:02 PM  CBC  WBC 4.0 - 10.5 K/uL 6.8  7.5    Hemoglobin 12.0 - 15.0 g/dL 40.3  47.4  25.9   Hematocrit 36.0 - 46.0 % 47.7  42.5  36.0   Platelets 150 - 400 K/uL 165  195       Author: Loyce Dys, MD 05/20/2023 6:06 PM  For on call review [email or web address].

## 2023-05-20 NOTE — ED Notes (Signed)
HCP notified regarding BP and vitals, as well as CBG staying consistently high.

## 2023-05-20 NOTE — ED Notes (Signed)
Green top sent down to lab per their request with save tube label

## 2023-05-20 NOTE — Discharge Planning (Signed)
ESTABLISHED HEMODIALYSIS Outpatient Facility  [facility]  [address]  [place], [place] [address] [phone]  Schedule: TTS 11:15am  Dimas Chyle Dialysis Coordinator II  [facility] Cell: [phone] eFax: [phone] [email or web address]

## 2023-05-20 NOTE — H&P (Addendum)
History and Physical    Patient: Ashley Freeman WUJ:[id number] DOB: [birth year] DOA: 05/20/2023 DOS: the patient was seen and examined on 05/20/2023 PCP: [facility]  Patient coming from: Home  Chief Complaint:  Chief Complaint  Patient presents with   Hyperglycemia    BG read high with EMS     HPI: Ashley Freeman is a 33 y.o. 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 difficile, who presents to the ED by EMS with high readings on her glucometer.  She is symptomatic for vomiting, weakness and generalized aching.  She denies fever, cough, diarrhea or dysuria. ED course and data review: BP 162/106 with otherwise normal vitals Labs with glucose 921, anion gap of 16 bicarb 18, beta hydroxybutyric acid 0.17.  Venous pH 7.29 Sodium 122 and potassium 5.3 WBC 6.8, hemoglobin 14 Respiratory panel pending CK1 99 Lipase normal at 24, slight LFT elevation with alk phos 207 and total bili 1.4 normal transaminases   EKG with sinus rhythm at 78 No imaging done  Patient given a 1 L fluid bolus and started on insulin per DKA.  Hospitalist consulted for admission.    Past Medical History:  Diagnosis Date   Abscess, gluteal, right 08/24/2013   Anemia 02/19/2012   Bartholin's gland abscess 09/19/2013   Bipolar disorder (HCC)    BV (bacterial vaginosis) 11/24/2015   Depression    Diabetes mellitus type I (HCC) 2001   Diagnosed at age 47 ; Type I   Diarrhea 05/30/2016   DKA (diabetic ketoacidoses) 08/19/2013   Also in 2018   ESRD (end stage renal disease) (HCC)    Gonorrhea 08/2011   Treated in 09/2011   HFrEF (heart failure with reduced ejection fraction) (HCC)    a. 2022 Echo: EF 40%; b. 10/2021 Echo: EF 55%; b. 07/2022 MV: No ischemia. EF 31%; c. 08/2022 Echo: EF 35%, mildly dil RV, sev TR.   History of trichomoniasis 05/31/2016    Hyperlipidemia 03/28/2016   Hypertension    NICM (nonischemic cardiomyopathy) (HCC)    Sepsis (HCC) 09/19/2013   Past Surgical History:  Procedure Laterality Date   AV FISTULA PLACEMENT Right 07/06/2022   Procedure: ARTERIOVENOUS GRAFT CREATION;  Surgeon: Cephus Shelling, MD;  Location: [facility] OR;  Service: Vascular;  Laterality: Right;   CESAREAN SECTION N/A 10/05/2019   Procedure: CESAREAN SECTION;  Surgeon: Hanston Bing, MD;  Location: [facility] LD ORS;  Service: Obstetrics;  Laterality: N/A;   INCISION AND DRAINAGE ABSCESS Left 09/28/2019   Procedure: INCISION AND DRAINAGE VULVAR ABCESS;  Surgeon: Tilda Burrow, MD;  Location: [facility] OR;  Service: Gynecology;  Laterality: Left;   INCISION AND DRAINAGE PERIRECTAL ABSCESS Right 08/18/2013   Procedure: IRRIGATION AND DEBRIDEMENT GLUTEAL ABSCESS;  Surgeon: Axel Filler, MD;  Location: [facility] OR;  Service: General;  Laterality: Right;   INCISION AND DRAINAGE PERIRECTAL ABSCESS Right 09/19/2013   Procedure: IRRIGATION AND DEBRIDEMENT RIGHT GLUTEAL AND LABIAL ABSCESSES;  Surgeon: Axel Filler, MD;  Location: [facility] OR;  Service: General;  Laterality: Right;   INCISION AND DRAINAGE PERIRECTAL ABSCESS Right 09/24/2013   Procedure: IRRIGATION AND DEBRIDEMENT PERIRECTAL ABSCESS;  Surgeon: Cherylynn Ridges, MD;  Location: [facility] OR;  Service: General;  Laterality: Right;   Social History:  reports that she has never smoked. She has never been exposed to tobacco smoke. She has never used smokeless tobacco. She reports that she does  not currently use alcohol. She reports that she does not use drugs.  Allergies  Allergen Reactions   Cephalexin Anaphylaxis    Has gotten ceftriaxone in the past    Morphine Itching   Penicillins Hives and Rash    Has patient had a PCN reaction causing immediate rash, facial/tongue/throat swelling, SOB or lightheadedness with hypotension: Yes Has patient had a PCN reaction causing severe rash involving mucus membranes or skin necrosis:  No Has patient had a PCN reaction that required hospitalization: Yes Has patient had a PCN reaction occurring within the last 10 years: No Spoke with pt - childhood hives told by mom, tried no pcns since, doesn't remember reaction herself    Benadryl [Diphenhydramine] Itching   Doxycycline Itching    Family History  Problem Relation Age of Onset   Asthma Mother    Carpal tunnel syndrome Mother    Gout Father    Diabetes Paternal Grandmother    Anesthesia problems Neg Hx     Prior to Admission medications   Medication Sig Start Date End Date Taking? Authorizing Provider  acetaminophen (TYLENOL) 500 MG tablet Take 1,000 mg by mouth every 6 (six) hours as needed for mild pain or moderate pain.    [provider]  amLODipine (NORVASC) 10 MG tablet Take 1 tablet (10 mg total) by mouth daily. 11/11/21 02/12/23  Lorin Glass, MD  blood glucose meter kit and supplies KIT Dispense based on patient and insurance preference. Use up to four times daily as directed. (FOR ICD-9 250.00, 250.01). 05/25/17   Jacalyn Lefevre, MD  Blood Pressure Monitoring (BLOOD PRESSURE KIT) DEVI 1 Device by Does not apply route as needed. 05/06/19   Anyanwu, Jethro Bastos, MD  carvedilol (COREG) 25 MG tablet Take 1 tablet (25 mg total) by mouth 2 (two) times daily. Patient not taking: Reported on 02/12/2023 11/11/21   Lorin Glass, MD  Continuous Blood Gluc Sensor (DEXCOM G6 SENSOR) MISC Change sensors every 10 days 07/02/22   Shamleffer, Konrad Dolores, MD  Continuous Blood Gluc Transmit (DEXCOM G6 TRANSMITTER) MISC Inject 1 Device into the skin as directed. Use to check blood sugar daily 07/02/22   Shamleffer, Konrad Dolores, MD  Continuous Glucose Sensor (DEXCOM G7 SENSOR) MISC Change sensors every 10 days 09/07/22   Shamleffer, Konrad Dolores, MD  furosemide (LASIX) 80 MG tablet Take 80 mg by mouth 2 (two) times daily.    [provider]  insulin aspart (NOVOLOG) 100 UNIT/ML injection Max daily 50 units  .Uses insulin pump 09/07/22   Shamleffer, Konrad Dolores, MD  Insulin Disposable Pump (OMNIPOD 5 G6 INTRO, GEN 5,) KIT Change pods every 3 days 01/22/22   Shamleffer, Konrad Dolores, MD  Insulin Disposable Pump (OMNIPOD 5 G6 POD, GEN 5,) MISC CHANGE EVERY 3 DAYS 04/13/22   Shamleffer, Konrad Dolores, MD  Insulin Pen Needle 32G X 4 MM MISC Use as directed 10/07/20   Ghimire, Werner Lean, MD  lamoTRIgine (LAMICTAL) 100 MG tablet Take 100 mg by mouth daily.    [provider]  LORazepam (ATIVAN) 0.5 MG tablet Take 0.5 mg by mouth daily as needed for anxiety.    [provider]  losartan (COZAAR) 50 MG tablet Take 50 mg by mouth daily.    [provider]  oxyCODONE-acetaminophen (PERCOCET/ROXICET) 5-325 MG tablet Take 1 tablet by mouth every 4 (four) hours as needed for severe pain. 07/10/22   Rhyne, Ames Coupe, PA-C  rosuvastatin (CRESTOR) 40 MG tablet Take 40 mg by mouth daily. 05/22/22  [provider]  sevelamer carbonate (RENVELA) 800 MG tablet Take 2,400 mg by mouth 3 (three) times daily with meals.    [provider]  SUMAtriptan (IMITREX) 50 MG tablet Take 50 mg by mouth every 2 (two) hours as needed for migraine or headache. 06/20/22   [provider]    Physical Exam: Vitals:   05/20/23 0334 05/20/23 0341 05/20/23 0435 05/20/23 0537  BP:   (!) 162/106   Pulse:  76 79   Resp:  20 (!) 21   Temp:  97.6 F (36.4 C)    TempSrc:  Oral    SpO2: 94% 97% 98%   Weight:    60 kg  Height:    5\' 3"  (1.6 m)   Physical Exam Vitals and nursing note reviewed.  Constitutional:      General: She is sleeping. She is not in acute distress.    Appearance: She is ill-appearing.  HENT:     Head: Normocephalic and atraumatic.  Cardiovascular:     Rate and Rhythm: Normal rate and regular rhythm.     Heart sounds: Normal heart sounds.  Pulmonary:     Effort: Pulmonary effort is normal.     Breath sounds: Normal breath sounds.  Abdominal:      Palpations: Abdomen is soft.     Tenderness: There is no abdominal tenderness.  Neurological:     Mental Status: She is easily aroused. She is lethargic.     Labs on Admission: I have personally reviewed following labs and imaging studies  CBC: Recent Labs  Lab 05/20/23 0356  WBC 6.8  HGB 14.4  HCT 47.7*  MCV 85.9  PLT 165   Basic Metabolic Panel: Recent Labs  Lab 05/20/23 0356  NA 122*  K 5.3*  CL 88*  CO2 18*  GLUCOSE 921*  BUN 58*  CREATININE 8.29*  CALCIUM 8.4*   GFR: Estimated Creatinine Clearance: 8.2 mL/min (A) (by C-G formula based on SCr of 8.29 mg/dL (H)). Liver Function Tests: Recent Labs  Lab 05/20/23 0356  AST 36  ALT 25  ALKPHOS 207*  BILITOT 1.4*  PROT 7.4  ALBUMIN 4.2   Recent Labs  Lab 05/20/23 0356  LIPASE 24   No results for input(s): "AMMONIA" in the last 168 hours. Coagulation Profile: No results for input(s): "INR", "PROTIME" in the last 168 hours. Cardiac Enzymes: Recent Labs  Lab 05/20/23 0356  CKTOTAL 199   BNP (last 3 results) No results for input(s): "PROBNP" in the last 8760 hours. HbA1C: No results for input(s): "HGBA1C" in the last 72 hours. CBG: Recent Labs  Lab 05/20/23 0349 05/20/23 0529  GLUCAP >600* >600*   Lipid Profile: No results for input(s): "CHOL", "HDL", "LDLCALC", "TRIG", "CHOLHDL", "LDLDIRECT" in the last 72 hours. Thyroid Function Tests: No results for input(s): "TSH", "T4TOTAL", "FREET4", "T3FREE", "THYROIDAB" in the last 72 hours. Anemia Panel: No results for input(s): "VITAMINB12", "FOLATE", "FERRITIN", "TIBC", "IRON", "RETICCTPCT" in the last 72 hours. Urine analysis:    Component Value Date/Time   COLORURINE YELLOW (A) 09/07/2022 1840   APPEARANCEUR CLEAR (A) 09/07/2022 1840   APPEARANCEUR Hazy 11/10/2013 2043   LABSPEC 1.013 09/07/2022 1840   LABSPEC 1.031 11/10/2013 2043   PHURINE 8.0 09/07/2022 1840   GLUCOSEU 150 (A) 09/07/2022 1840   GLUCOSEU >=500 11/10/2013 2043   HGBUR  NEGATIVE 09/07/2022 1840   BILIRUBINUR NEGATIVE 09/07/2022 1840   BILIRUBINUR negative 06/04/2018 1035   BILIRUBINUR Negative 11/24/2015 1443   BILIRUBINUR Negative 11/10/2013 2043  KETONESUR NEGATIVE 09/07/2022 1840   PROTEINUR >=300 (A) 09/07/2022 1840   UROBILINOGEN 0.2 09/14/2019 1732   NITRITE NEGATIVE 09/07/2022 1840   LEUKOCYTESUR NEGATIVE 09/07/2022 1840   LEUKOCYTESUR 1+ 11/10/2013 2043    Radiological Exams on Admission: No results found.   Data Reviewed: Relevant notes from primary care and specialist visits, past discharge summaries as available in EHR, including Care Everywhere. Prior diagnostic testing as pertinent to current admission diagnoses Updated medications and problem lists for reconciliation ED course, including vitals, labs, imaging, treatment and response to treatment Triage notes, nursing and pharmacy notes and ED provider's notes Notable results as noted in HPI   Assessment and Plan: * Hyperglycemic crisis due to Type 1 diabetes mellitus [facility]) Patient meeting criteria for HHS than DKA pending osmolality.  Beta hydroxybutyric acid normal, normal venous pH and very high glucose of 921, serum osmolality pending Fluid bolus with careful monitoring due to dialysis status Continue insulin infusion per Endo tool  Chronic heart failure with reduced ejection fraction (HFrEF, <= 40%) (HCC) Dilated cardiomyopathy EF 35% May 2024 Patient clinically dry Continue losartan and carvedilol and Lasix Monitor for fluid overload in view of IV fluid resuscitation and treatment of hyperglycemic crisis Daily weights  ESRD on hemodialysis [facility]) Nephrology consulted for continuation of dialysis and fluid control  Generalized pain Suspect secondary to dehydration CK was normal Pain control Follow-up respiratory viral panel to evaluate for viral illness  Essential hypertension Continue losartan and carvedilol    DVT prophylaxis:heparin  Consults:  renal  Advance Care Planning:   Code Status: Prior   Family Communication: none  Disposition Plan: Back to previous home environment  Severity of Illness: The appropriate patient status for this patient is INPATIENT. Inpatient status is judged to be reasonable and necessary in order to provide the required intensity of service to ensure the patient's safety. The patient's presenting symptoms, physical exam findings, and initial radiographic and laboratory data in the context of their chronic comorbidities is felt to place them at high risk for further clinical deterioration. Furthermore, it is not anticipated that the patient will be medically stable for discharge from the hospital within 2 midnights of admission.   * I certify that at the point of admission it is my clinical judgment that the patient will require inpatient hospital care spanning beyond 2 midnights from the point of admission due to high intensity of service, high risk for further deterioration and high frequency of surveillance required.*  Author: Andris Baumann, MD 05/20/2023 5:39 AM  For on call review [email or web address].

## 2023-05-21 DIAGNOSIS — E1165 Type 2 diabetes mellitus with hyperglycemia: Secondary | ICD-10-CM | POA: Diagnosis not present

## 2023-05-21 LAB — BASIC METABOLIC PANEL
Anion gap: 12 (ref 5–15)
Anion gap: 14 (ref 5–15)
BUN: 59 mg/dL — ABNORMAL HIGH (ref 6–20)
BUN: 61 mg/dL — ABNORMAL HIGH (ref 6–20)
CO2: 18 mmol/L — ABNORMAL LOW (ref 22–32)
CO2: 19 mmol/L — ABNORMAL LOW (ref 22–32)
Calcium: 7.5 mg/dL — ABNORMAL LOW (ref 8.9–10.3)
Calcium: 7.7 mg/dL — ABNORMAL LOW (ref 8.9–10.3)
Chloride: 100 mmol/L (ref 98–111)
Chloride: 99 mmol/L (ref 98–111)
Creatinine, Ser: 8.72 mg/dL — ABNORMAL HIGH (ref 0.44–1.00)
Creatinine, Ser: 8.93 mg/dL — ABNORMAL HIGH (ref 0.44–1.00)
GFR, Estimated: 6 mL/min — ABNORMAL LOW (ref >60)
GFR, Estimated: 6 mL/min — ABNORMAL LOW (ref >60)
Glucose, Bld: 148 mg/dL — ABNORMAL HIGH (ref 70–99)
Glucose, Bld: 95 mg/dL (ref 70–99)
Potassium: 3.8 mmol/L (ref 3.5–5.1)
Potassium: 4 mmol/L (ref 3.5–5.1)
Sodium: 130 mmol/L — ABNORMAL LOW (ref 135–145)
Sodium: 132 mmol/L — ABNORMAL LOW (ref 135–145)

## 2023-05-21 LAB — CBC
HCT: 38 % (ref 36.0–46.0)
Hemoglobin: 12.3 g/dL (ref 12.0–15.0)
MCH: 26.1 pg (ref 26.0–34.0)
MCHC: 32.4 g/dL (ref 30.0–36.0)
MCV: 80.5 fL (ref 80.0–100.0)
Platelets: 149 10*3/uL — ABNORMAL LOW (ref 150–400)
RBC: 4.72 MIL/uL (ref 3.87–5.11)
RDW: 14.3 % (ref 11.5–15.5)
WBC: 6.5 10*3/uL (ref 4.0–10.5)
nRBC: 0 % (ref 0.0–0.2)

## 2023-05-21 LAB — HEPATITIS B SURFACE ANTIGEN: Hepatitis B Surface Ag: NONREACTIVE

## 2023-05-21 LAB — OSMOLALITY: Osmolality: 294 mosm/kg (ref 275–295)

## 2023-05-21 LAB — CBG MONITORING, ED
Glucose-Capillary: 120 mg/dL — ABNORMAL HIGH (ref 70–99)
Glucose-Capillary: 134 mg/dL — ABNORMAL HIGH (ref 70–99)
Glucose-Capillary: 147 mg/dL — ABNORMAL HIGH (ref 70–99)
Glucose-Capillary: 112 mg/dL — ABNORMAL HIGH (ref 70–99)
Glucose-Capillary: 94 mg/dL (ref 70–99)
Glucose-Capillary: 75 mg/dL (ref 70–99)
Glucose-Capillary: 98 mg/dL (ref 70–99)
Glucose-Capillary: 167 mg/dL — ABNORMAL HIGH (ref 70–99)

## 2023-05-21 LAB — HEMOGLOBIN A1C
Hgb A1c MFr Bld: 10.6 % — ABNORMAL HIGH (ref 4.8–5.6)
Mean Plasma Glucose: 257.52 mg/dL

## 2023-05-21 LAB — BETA-HYDROXYBUTYRIC ACID: Beta-Hydroxybutyric Acid: 0.12 mmol/L (ref 0.05–0.27)

## 2023-05-21 MED ORDER — INSULIN ASPART 100 UNIT/ML IJ SOLN
0.0000 [IU] | Freq: Three times a day (TID) | INTRAMUSCULAR | Status: DC
  Administered 2023-05-21: 1 [IU] via SUBCUTANEOUS
  Filled 2023-05-21: qty 1

## 2023-05-21 MED ORDER — INSULIN ASPART 100 UNIT/ML IJ SOLN
0.0000 [IU] | Freq: Every day | INTRAMUSCULAR | Status: DC
Start: 2023-05-21 — End: 2023-05-21

## 2023-05-21 MED ORDER — INSULIN GLARGINE-YFGN 100 UNIT/ML ~~LOC~~ SOLN
8.0000 [IU] | Freq: Every day | SUBCUTANEOUS | Status: DC
  Administered 2023-05-21: 8 [IU] via SUBCUTANEOUS
  Filled 2023-05-21: qty 0.08

## 2023-05-21 NOTE — Progress Notes (Signed)
   05/21/23 1336  Vitals  Temp 97.9 F (36.6 C)  Temp Source Oral  BP (!) 147/107  MAP (mmHg) 117  BP Location Right Leg  BP Method Automatic  Patient Position (if appropriate) Lying  Pulse Rate 87  Pulse Rate Source Monitor  ECG Heart Rate 86  Resp (!) 22  During Treatment Monitoring  Blood Flow Rate (mL/min) 0 mL/min  Arterial Pressure (mmHg) -3.03 mmHg  Venous Pressure (mmHg) -1.61 mmHg  TMP (mmHg) -51.51 mmHg  Ultrafiltration Rate (mL/min) 688 mL/min  Dialysate Flow Rate (mL/min) 0 ml/min  Duration of HD Treatment -hour(s) 2.7 hour(s)  Cumulative Fluid Removed (mL) per Treatment  790.65  Post Treatment  Dialyzer Clearance Lightly streaked  Hemodialysis Intake (mL) 0 mL  Liters Processed 48.5  Fluid Removed (mL) 800 mL  Tolerated HD Treatment Yes  Post-Hemodialysis Comments ended 18 minutes early to go to bathroom refused to use bedpan no other problems to note  AVG/AVF Arterial Site Held (minutes) 5 minutes  AVG/AVF Venous Site Held (minutes) 5 minutes  Note  Patient Observations tolerated well   Received patient in bed to unit.  Alert and oriented.  Informed consent signed and in chart.   TX duration:2:42  Patient tolerated well.   Alert, without acute distress.  Hand-off given to patient's nurse.   Access used: R upper AVG Access issues: none  Total UF removed: 800 Medication(s) given: none Post HD VS: see above Post HD weight: n/a   [facility]

## 2023-05-21 NOTE — Progress Notes (Signed)
   05/21/23 1104  Readmission Prevention Plan - High Risk  Transportation Screening Complete  PCP or Specialist appointment within 5-7 days of discharge Complete (Nurse Secretary will schedule)  High Risk Social Work Consult for recovery care planning/counseling (includes patient and caregiver) Complete  High Risk Palliative Care Screening Not Applicable  Medication Review Complete   CSW met with pt at bedside to complete assessment.   Admitted for: Diabetes  Admitted from: Home w/ parents  Pharmacy: [facility]  Current home health/prior home health/DME: None   No current TOC needs.

## 2023-05-21 NOTE — Discharge Summary (Signed)
Physician Discharge Summary   Patient: Ashley Freeman MRN: [id number] DOB: 1990-06-21  Admit date:     05/20/2023  Discharge date: 05/21/23  Discharge Physician: Loyce Dys   PCP: [facility]   Recommendations at discharge:  Follow-up with PCP  Discharge Diagnoses:  Hyperglycemic crisis due to Type 1 diabetes mellitus (HCC) Chronic heart failure with reduced ejection fraction (HFrEF, <= 40%) (HCC) Dilated cardiomyopathy ESRD on hemodialysis (HCC) Generalized pain Essential hypertension   Hospital Course:  Ashley Freeman is a 33 y.o. 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 difficile, who presents to the ED by EMS with high readings on her glucometer.  She is symptomatic for vomiting, weakness and generalized aching.  Found to have DKA/HHS.  Mental status improved with IV fluid as well as IV insulin.  Patient underwent hemodialysis currently back to her baseline and have requested to be discharged so she is able to take care of her child at home.  Patient was also seen by diabetic coordinator and was counseled extensively concerning compliance with her medication as according to her she missed some doses.  Consultants: Nephrology Procedures performed: Hemodialysis Disposition: Home Diet recommendation:  Cardiac diet DISCHARGE MEDICATION: Allergies as of 05/21/2023       Reactions   Cephalexin Anaphylaxis   Has gotten ceftriaxone in the past    Morphine Itching   Penicillins Hives, Rash   Has patient had a PCN reaction causing immediate rash, facial/tongue/throat swelling, SOB or lightheadedness with hypotension: Yes Has patient had a PCN reaction causing severe rash involving mucus membranes or skin necrosis: No Has patient had a PCN reaction that required hospitalization: Yes Has patient had a PCN reaction occurring  within the last 10 years: No Spoke with pt - childhood hives told by mom, tried no pcns since, doesn't remember reaction herself    Benadryl [diphenhydramine] Itching   Doxycycline Itching        Medication List     STOP taking these medications    furosemide 80 MG tablet Commonly known as: LASIX       TAKE these medications    acetaminophen 500 MG tablet Commonly known as: TYLENOL Take 1,000 mg by mouth every 6 (six) hours as needed for mild pain or moderate pain.   albuterol 108 (90 Base) MCG/ACT inhaler Commonly known as: VENTOLIN HFA Inhale 2 puffs into the lungs every 4 (four) hours as needed.   amLODipine 10 MG tablet Commonly known as: NORVASC Take 1 tablet (10 mg total) by mouth daily.   Baqsimi Two Pack 3 MG/DOSE Powd Generic drug: Glucagon Place 1 spray into both nostrils daily.   blood glucose meter kit and supplies Kit Dispense based on patient and insurance preference. Use up to four times daily as directed. (FOR ICD-9 250.00, 250.01).   Blood Pressure Kit Devi 1 Device by Does not apply route as needed.   carvedilol 25 MG tablet Commonly known as: COREG Take 25 mg by mouth 2 (two) times daily with a meal. What changed: Another medication with the same name was removed. Continue taking this medication, and follow the directions you see here.   Dexcom G6 Sensor Misc Change sensors every 10 days   Dexcom G7 Sensor Misc Change sensors every 10 days   Dexcom G6 Transmitter Misc Inject 1 Device into the skin as directed. Use to check blood sugar  daily   famotidine 20 MG tablet Commonly known as: PEPCID Take 1 tablet by mouth daily.   FLUoxetine 20 MG capsule Commonly known as: PROZAC Take 1 capsule by mouth daily.   hydrOXYzine 25 MG tablet Commonly known as: ATARAX Take 25 mg by mouth every 4 (four) hours as needed.   insulin aspart 100 UNIT/ML injection Commonly known as: novoLOG Max daily 50 units .Uses insulin pump   Insulin Aspart  FlexPen 100 UNIT/ML Commonly known as: NOVOLOG Inject 4 Units into the skin 3 (three) times daily.   lamoTRIgine 200 MG tablet Commonly known as: LAMICTAL Take 200 mg by mouth daily. What changed: Another medication with the same name was removed. Continue taking this medication, and follow the directions you see here.   Lantus SoloStar 100 UNIT/ML Solostar Pen Generic drug: insulin glargine SMARTSIG:8 Unit(s) SUB-Q Daily   LORazepam 1 MG tablet Commonly known as: ATIVAN Take 1 mg by mouth daily as needed. What changed: Another medication with the same name was removed. Continue taking this medication, and follow the directions you see here.   losartan 100 MG tablet Commonly known as: COZAAR Take 1 tablet by mouth daily. What changed: Another medication with the same name was removed. Continue taking this medication, and follow the directions you see here.   methocarbamol 500 MG tablet Commonly known as: ROBAXIN Take 500 mg by mouth every 8 (eight) hours as needed. Take 1 tablet (500 mg total) by mouth Three (3) times a day as needed for up to 10 days.   Omnipod 5 DexG7G6 Intro Gen 5 Kit Change pods every 3 days   Omnipod 5 DexG7G6 Pods Gen 5 Misc CHANGE EVERY 3 DAYS   oxyCODONE-acetaminophen 5-325 MG tablet Commonly known as: PERCOCET/ROXICET Take 1 tablet by mouth every 4 (four) hours as needed for severe pain.   Pentips 32G X 4 MM Misc Generic drug: Insulin Pen Needle Use as directed   permethrin 5 % cream Commonly known as: ELIMITE Apply topically.   polyethylene glycol 17 g packet Commonly known as: MIRALAX / GLYCOLAX Take 17 g by mouth daily as needed.   rosuvastatin 40 MG tablet Commonly known as: CRESTOR Take 40 mg by mouth daily.   sevelamer carbonate 800 MG tablet Commonly known as: RENVELA Take 2,400 mg by mouth 3 (three) times daily with meals.   SUMAtriptan 50 MG tablet Commonly known as: IMITREX Take 50 mg by mouth every 2 (two) hours as needed  for migraine or headache.        Discharge Exam: Filed Weights   05/20/23 0537  Weight: 60 kg   Constitutional:      General: Not in acute distress HENT:     Head: Normocephalic and atraumatic.  Cardiovascular:     Rate and Rhythm: Normal rate and regular rhythm.     Heart sounds: Normal heart sounds.  Pulmonary:     Effort: Pulmonary effort is normal.     Breath sounds: Normal breath sounds.  Abdominal:     Palpations: Abdomen is soft.     Tenderness: There is no abdominal tenderness.  Neurological: Alert and oriented    Condition at discharge: good  The results of significant diagnostics from this hospitalization (including imaging, microbiology, ancillary and laboratory) are listed below for reference.   Imaging Studies: DG Chest Port 1 View Result Date: 05/20/2023 CLINICAL DATA:  Nausea and vomiting with pain. EXAM: PORTABLE CHEST 1 VIEW COMPARISON:  02/19/2023 FINDINGS: Low volume film. The cardio pericardial silhouette  is enlarged. Interstitial markings are diffusely coarsened with chronic features. Diffuse interstitial opacity with subtle airspace disease in the lower lungs. No acute bony abnormality. Telemetry leads overlie the chest. IMPRESSION: Low volume film with diffuse interstitial and basilar airspace opacities. Findings may reflect infectious/inflammatory etiology although pulmonary edema not excluded. Electronically Signed   By: Kennith Center M.D.   On: 05/20/2023 06:14    Microbiology: Results for orders placed or performed during the hospital encounter of 05/20/23  Resp panel by RT-PCR (RSV, Flu A&B, Covid) Anterior Nasal Swab     Status: None   Collection Time: 05/20/23  3:56 AM   Specimen: Anterior Nasal Swab  Result Value Ref Range Status   SARS Coronavirus 2 by RT PCR NEGATIVE NEGATIVE Final    Comment: (NOTE) SARS-CoV-2 target nucleic acids are NOT DETECTED.  The SARS-CoV-2 RNA is generally detectable in upper respiratory specimens during the  acute phase of infection. The lowest concentration of SARS-CoV-2 viral copies this assay can detect is 138 copies/mL. A negative result does not preclude SARS-Cov-2 infection and should not be used as the sole basis for treatment or other patient management decisions. A negative result may occur with  improper specimen collection/handling, submission of specimen other than nasopharyngeal swab, presence of viral mutation(s) within the areas targeted by this assay, and inadequate number of viral copies(<138 copies/mL). A negative result must be combined with clinical observations, patient history, and epidemiological information. The expected result is Negative.  Fact Sheet for Patients:  BloggerCourse.com  Fact Sheet for Healthcare Providers:  SeriousBroker.it  This test is no t yet approved or cleared by the [place] FDA and  has been authorized for detection and/or diagnosis of SARS-CoV-2 by FDA under an Emergency Use Authorization (EUA). This EUA will remain  in effect (meaning this test can be used) for the duration of the COVID-19 declaration under Section 564(b)(1) of the Act, 21 U.S.C.section 360bbb-3(b)(1), unless the authorization is terminated  or revoked sooner.       Influenza A by PCR NEGATIVE NEGATIVE Final   Influenza B by PCR NEGATIVE NEGATIVE Final    Comment: (NOTE) The Xpert Xpress SARS-CoV-2/FLU/RSV plus assay is intended as an aid in the diagnosis of influenza from Nasopharyngeal swab specimens and should not be used as a sole basis for treatment. Nasal washings and aspirates are unacceptable for Xpert Xpress SARS-CoV-2/FLU/RSV testing.  Fact Sheet for Patients: BloggerCourse.com  Fact Sheet for Healthcare Providers: SeriousBroker.it  This test is not yet approved or cleared by the [place] FDA and has been authorized for detection and/or  diagnosis of SARS-CoV-2 by FDA under an Emergency Use Authorization (EUA). This EUA will remain in effect (meaning this test can be used) for the duration of the COVID-19 declaration under Section 564(b)(1) of the Act, 21 U.S.C. section 360bbb-3(b)(1), unless the authorization is terminated or revoked.     Resp Syncytial Virus by PCR NEGATIVE NEGATIVE Final    Comment: (NOTE) Fact Sheet for Patients: BloggerCourse.com  Fact Sheet for Healthcare Providers: SeriousBroker.it  This test is not yet approved or cleared by the [place] FDA and has been authorized for detection and/or diagnosis of SARS-CoV-2 by FDA under an Emergency Use Authorization (EUA). This EUA will remain in effect (meaning this test can be used) for the duration of the COVID-19 declaration under Section 564(b)(1) of the Act, 21 U.S.C. section 360bbb-3(b)(1), unless the authorization is terminated or revoked.  Performed at [facility], [address] [place], [place] [address]  Labs: CBC: Recent Labs  Lab 05/20/23 0356 05/21/23 1010  WBC 6.8 6.5  HGB 14.4 12.3  HCT 47.7* 38.0  MCV 85.9 80.5  PLT 165 149*   Basic Metabolic Panel: Recent Labs  Lab 05/20/23 0356 05/20/23 0856 05/20/23 1618 05/20/23 2355 05/21/23 0526  NA 131*  122* 128* 128* 132* 130*  K 4.0  5.3* 3.7 3.7 3.8 4.0  CL 97*  88* 95* 95* 100 99  CO2 21*  18* 21* 18* 18* 19*  GLUCOSE 242*  921* 496* 125* 148* 95  BUN 59*  58* 59* 57* 59* 61*  CREATININE 8.29*  8.29* 8.52* 8.56* 8.72* 8.93*  CALCIUM 8.3*  8.4* 7.9* 8.0* 7.7* 7.5*   Liver Function Tests: Recent Labs  Lab 05/20/23 0356  AST 36  ALT 25  ALKPHOS 207*  BILITOT 1.4*  PROT 7.4  ALBUMIN 4.2   CBG: Recent Labs  Lab 05/21/23 0434 05/21/23 0520 05/21/23 0623 05/21/23 0842 05/21/23 1219  GLUCAP 112* 94 75 98 167*    Discharge time spent:  .  Signed: Loyce Dys,  MD [facility] 05/21/2023

## 2023-05-21 NOTE — ED Notes (Signed)
Assisted pt to bathroom

## 2023-05-21 NOTE — ED Notes (Signed)
NP, Geradine Girt made aware that pt's labs have resulted and that pt's last CBG @0624  was 75.

## 2023-05-21 NOTE — Progress Notes (Signed)
[facility]  ROUNDING NOTE   Subjective:   Ashley Freeman is a 33 year old female with history of type 1 diabetes, hypertension, hyperlipidemia, end-stage renal disease on hemodialysis.  She presents to the emergency department with hyperglycemia and has been admitted for Hyperglycemic crisis due to diabetes mellitus [facility]) [E11.65]  Patient is known to our practice and receives outpatient dialysis treatments at [facility] on a MWF schedule, supervised by Dr. Cherylann Ratel.    Patient seen resting on stretcher Alert and oriented States she would like to discharge today  Scheduled for dialysis this morning   Objective:  Vital signs in last 24 hours:  Temp:  [97.4 F (36.3 C)-98.2 F (36.8 C)] 97.4 F (36.3 C) (01/21 1010) Pulse Rate:  [36-88] 88 (01/21 1300) Resp:  [0-26] 24 (01/21 1300) BP: (109-164)/(49-97) 148/74 (01/21 1300) SpO2:  [72 %-100 %] 96 % (01/21 1300)  Weight change:  Filed Weights   05/20/23 0537  Weight: 60 kg    Intake/Output: I/O last 3 completed shifts: In: 3173.9 [I.V.:2173.9; IV Piggyback:1000] Out: -    Intake/Output this shift:  No intake/output data recorded.  Physical Exam: General: NAD  Head: Normocephalic, atraumatic.   Eyes: Anicteric  Lungs:  Clear to auscultation, normal effort  Heart: Regular rate and rhythm  Abdomen:  Soft, nontender, nondistended  Extremities: Trace peripheral edema.  Neurologic: Alert  Skin: No lesions  Access: Right AVG    Basic Metabolic Panel: Recent Labs  Lab 05/20/23 0356 05/20/23 0856 05/20/23 1618 05/20/23 2355 05/21/23 0526  NA 131*  122* 128* 128* 132* 130*  K 4.0  5.3* 3.7 3.7 3.8 4.0  CL 97*  88* 95* 95* 100 99  CO2 21*  18* 21* 18* 18* 19*  GLUCOSE 242*  921* 496* 125* 148* 95  BUN 59*  58* 59* 57* 59* 61*  CREATININE 8.29*  8.29* 8.52* 8.56* 8.72* 8.93*  CALCIUM 8.3*  8.4* 7.9* 8.0* 7.7* 7.5*    Liver Function Tests: Recent Labs  Lab  05/20/23 0356  AST 36  ALT 25  ALKPHOS 207*  BILITOT 1.4*  PROT 7.4  ALBUMIN 4.2   Recent Labs  Lab 05/20/23 0356  LIPASE 24   No results for input(s): "AMMONIA" in the last 168 hours.  CBC: Recent Labs  Lab 05/20/23 0356 05/21/23 1010  WBC 6.8 6.5  HGB 14.4 12.3  HCT 47.7* 38.0  MCV 85.9 80.5  PLT 165 149*    Cardiac Enzymes: Recent Labs  Lab 05/20/23 0356  CKTOTAL 199    BNP: Invalid input(s): "POCBNP"  CBG: Recent Labs  Lab 05/21/23 0434 05/21/23 0520 05/21/23 0623 05/21/23 0842 05/21/23 1219  GLUCAP 112* 94 75 98 167*    Microbiology: Results for orders placed or performed during the hospital encounter of 05/20/23  Resp panel by RT-PCR (RSV, Flu A&B, Covid) Anterior Nasal Swab     Status: None   Collection Time: 05/20/23  3:56 AM   Specimen: Anterior Nasal Swab  Result Value Ref Range Status   SARS Coronavirus 2 by RT PCR NEGATIVE NEGATIVE Final    Comment: (NOTE) SARS-CoV-2 target nucleic acids are NOT DETECTED.  The SARS-CoV-2 RNA is generally detectable in upper respiratory specimens during the acute phase of infection. The lowest concentration of SARS-CoV-2 viral copies this assay can detect is 138 copies/mL. A negative result does not preclude SARS-Cov-2 infection and should not be used as the sole basis for treatment or other patient management decisions. A negative result may occur  with  improper specimen collection/handling, submission of specimen other than nasopharyngeal swab, presence of viral mutation(s) within the areas targeted by this assay, and inadequate number of viral copies(<138 copies/mL). A negative result must be combined with clinical observations, patient history, and epidemiological information. The expected result is Negative.  Fact Sheet for Patients:  BloggerCourse.com  Fact Sheet for Healthcare Providers:  SeriousBroker.it  This test is no t yet approved or  cleared by the [place] FDA and  has been authorized for detection and/or diagnosis of SARS-CoV-2 by FDA under an Emergency Use Authorization (EUA). This EUA will remain  in effect (meaning this test can be used) for the duration of the COVID-19 declaration under Section 564(b)(1) of the Act, 21 U.S.C.section 360bbb-3(b)(1), unless the authorization is terminated  or revoked sooner.       Influenza A by PCR NEGATIVE NEGATIVE Final   Influenza B by PCR NEGATIVE NEGATIVE Final    Comment: (NOTE) The Xpert Xpress SARS-CoV-2/FLU/RSV plus assay is intended as an aid in the diagnosis of influenza from Nasopharyngeal swab specimens and should not be used as a sole basis for treatment. Nasal washings and aspirates are unacceptable for Xpert Xpress SARS-CoV-2/FLU/RSV testing.  Fact Sheet for Patients: BloggerCourse.com  Fact Sheet for Healthcare Providers: SeriousBroker.it  This test is not yet approved or cleared by the [place] FDA and has been authorized for detection and/or diagnosis of SARS-CoV-2 by FDA under an Emergency Use Authorization (EUA). This EUA will remain in effect (meaning this test can be used) for the duration of the COVID-19 declaration under Section 564(b)(1) of the Act, 21 U.S.C. section 360bbb-3(b)(1), unless the authorization is terminated or revoked.     Resp Syncytial Virus by PCR NEGATIVE NEGATIVE Final    Comment: (NOTE) Fact Sheet for Patients: BloggerCourse.com  Fact Sheet for Healthcare Providers: SeriousBroker.it  This test is not yet approved or cleared by the [place] FDA and has been authorized for detection and/or diagnosis of SARS-CoV-2 by FDA under an Emergency Use Authorization (EUA). This EUA will remain in effect (meaning this test can be used) for the duration of the COVID-19 declaration under Section 564(b)(1) of the Act, 21  U.S.C. section 360bbb-3(b)(1), unless the authorization is terminated or revoked.  Performed at [facility], [address] [place], [place] [address]     Coagulation Studies: No results for input(s): "LABPROT", "INR" in the last 72 hours.  Urinalysis: No results for input(s): "COLORURINE", "LABSPEC", "PHURINE", "GLUCOSEU", "HGBUR", "BILIRUBINUR", "KETONESUR", "PROTEINUR", "UROBILINOGEN", "NITRITE", "LEUKOCYTESUR" in the last 72 hours.  Invalid input(s): "APPERANCEUR"    Imaging: DG Chest Port 1 View Result Date: 05/20/2023 CLINICAL DATA:  Nausea and vomiting with pain. EXAM: PORTABLE CHEST 1 VIEW COMPARISON:  02/19/2023 FINDINGS: Low volume film. The cardio pericardial silhouette is enlarged. Interstitial markings are diffusely coarsened with chronic features. Diffuse interstitial opacity with subtle airspace disease in the lower lungs. No acute bony abnormality. Telemetry leads overlie the chest. IMPRESSION: Low volume film with diffuse interstitial and basilar airspace opacities. Findings may reflect infectious/inflammatory etiology although pulmonary edema not excluded. Electronically Signed   By: Kennith Center M.D.   On: 05/20/2023 06:14     Medications:    insulin Stopped (05/21/23 0436)    carvedilol  25 mg Oral BID WC   Chlorhexidine Gluconate Cloth  6 each Topical Q0600   heparin  5,000 Units Subcutaneous Q8H   insulin aspart  0-5 Units Subcutaneous QHS   insulin aspart  0-6 Units Subcutaneous TID  WC   insulin glargine-yfgn  8 Units Subcutaneous Daily   losartan  50 mg Oral Daily   rosuvastatin  40 mg Oral Daily   dextrose, hydrALAZINE, hydrOXYzine, ondansetron (ZOFRAN) IV, oxyCODONE-acetaminophen  Assessment/ Plan:  Ms. DARCELL SWASEY is a 33 y.o.  female  with history of type 1 diabetes, hypertension, hyperlipidemia, end-stage renal disease on hemodialysis   End-stage renal disease on hemodialysis.  Last treatment received on Thursday.   Patient missed treatment on Saturday.  Due to scheduling conflicts, we were unable to provide dialysis yesterday.  Patient will receive scheduled dialysis treatment today.  If stable, will defer discharge planning to primary team.  2.  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 Home regimen includes NovoLog and Lantus.  Blood sugar greater then 900 on ED arrival.    Weaned off insulin drip early this morning.  Primary team managing.  3. Anemia of chronic kidney disease Lab Results  Component Value Date   HGB 12.3 05/21/2023    Hemoglobin remains acceptable.  4. Secondary Hyperparathyroidism: Without patient labs: PTH 351, phosphorus 3.1, calcium 8.9 on 05/14/2023 Lab Results  Component Value Date   CALCIUM 7.5 (L) 05/21/2023   CAION 0.95 (L) 01/05/2023   PHOS 6.4 (H) 09/08/2022    Bone minerals not within acceptable range.  Prescribed Savella Mier outpatient with meals.   LOS: 1 Shawniece Oyola 1/21/20251:30 PM

## 2023-05-21 NOTE — ED Notes (Signed)
Made contact with pt who was sleeping. Pt arousable to verbal stimuli. Pt says she does not take her BP meds prior to dialysis. Pt is scheduled for dialysis this morning, so will hold am BP medications. Pt has no complaints at this time. Will do further assessments when pt wakes up.

## 2023-05-21 NOTE — ED Notes (Signed)
Dialysis at bedside for hemodialysis tx

## 2023-05-21 NOTE — ED Notes (Signed)
Spoke with dialysis RN, Albin Felling. Gave SBAR report. Albin Felling stated that pt will have dialysis this morning. They have not yet determined whether it will take place in pt's room or dialysis unit. Will call if there are any changes.

## 2023-05-22 LAB — BLOOD GAS, VENOUS
Bicarbonate: 24 mmol/L — ABNORMAL HIGH (ref 20.0–28.0)
O2 Saturation: 49.7 mmol/L — ABNORMAL HIGH (ref 0.0–2.0)
Patient temperature: 37
Patient temperature: 49.7 %
pCO2, Ven: 50 mm[Hg] (ref 44–60)
pH, Ven: 7.29 (ref 7.25–7.43)
pO2, Ven: 31 mmol/L — CL (ref 32–45)

## 2023-06-03 ENCOUNTER — Ambulatory Visit: Payer: Medicaid Other | Admitting: Gastroenterology

## 2023-06-03 NOTE — Progress Notes (Deleted)
 Wyline Mood MD, MRCP(U.K) [address]  [address]  [place], [place] [address]  Main: [phone]  Fax: [phone]   Gastroenterology Consultation  Referring Provider:     [facility] Primary Care Physician:  Care, [facility] Primary Gastroenterologist:  Dr. Wyline Mood  Reason for Consultation:     ***        HPI:   Ashley Freeman is a 33 y.o. y/o female referred for consultation & management  by Dr. Care, [facility].  ***  Past Medical History:  Diagnosis Date   Abscess, gluteal, right 08/24/2013   Anemia 02/19/2012   Bartholin's gland abscess 09/19/2013   Bipolar disorder (HCC)    BV (bacterial vaginosis) 11/24/2015   Depression    Diabetes mellitus type I (HCC) 2001   Diagnosed at age 35 ; Type I   Diarrhea 05/30/2016   DKA (diabetic ketoacidoses) 08/19/2013   Also in 2018   ESRD (end stage renal disease) (HCC)    Gonorrhea 08/2011   Treated in 09/2011   HFrEF (heart failure with reduced ejection fraction) (HCC)    a. 2022 Echo: EF 40%; b. 10/2021 Echo: EF 55%; b. 07/2022 MV: No ischemia. EF 31%; c. 08/2022 Echo: EF 35%, mildly dil RV, sev TR.   History of trichomoniasis 05/31/2016   Hyperlipidemia 03/28/2016   Hypertension    NICM (nonischemic cardiomyopathy) (HCC)    Sepsis (HCC) 09/19/2013    Past Surgical History:  Procedure Laterality Date   AV FISTULA PLACEMENT Right 07/06/2022   Procedure: ARTERIOVENOUS GRAFT CREATION;  Surgeon: Cephus Shelling, MD;  Location: [facility];  Service: Vascular;  Laterality: Right;   CESAREAN SECTION N/A 10/05/2019   Procedure: CESAREAN SECTION;  Surgeon: McNeal Bing, MD;  [facility]: [facility];  Service: Obstetrics;  Laterality: N/A;   INCISION AND DRAINAGE ABSCESS Left 09/28/2019   Procedure: INCISION AND DRAINAGE VULVAR ABCESS;  Surgeon: Tilda Burrow, MD;  Location: [facility] OR;  Service: Gynecology;  Laterality: Left;   INCISION AND DRAINAGE PERIRECTAL ABSCESS Right 08/18/2013   Procedure:  IRRIGATION AND DEBRIDEMENT GLUTEAL ABSCESS;  Surgeon: Axel Filler, MD;  Location: [facility] OR;  Service: General;  Laterality: Right;   INCISION AND DRAINAGE PERIRECTAL ABSCESS Right 09/19/2013   Procedure: IRRIGATION AND DEBRIDEMENT RIGHT GLUTEAL AND LABIAL ABSCESSES;  Surgeon: Axel Filler, MD;  Location: [facility] OR;  Service: General;  Laterality: Right;   INCISION AND DRAINAGE PERIRECTAL ABSCESS Right 09/24/2013   Procedure: IRRIGATION AND DEBRIDEMENT PERIRECTAL ABSCESS;  Surgeon: Cherylynn Ridges, MD;  Location: [facility] OR;  Service: General;  Laterality: Right;    Prior to Admission medications   Medication Sig Start Date End Date Taking? Authorizing Provider  acetaminophen (TYLENOL) 500 MG tablet Take 1,000 mg by mouth every 6 (six) hours as needed for mild pain or moderate pain.    [provider]  albuterol (VENTOLIN HFA) 108 (90 Base) MCG/ACT inhaler Inhale 2 puffs into the lungs every 4 (four) hours as needed. 11/24/22   [provider]  amLODipine (NORVASC) 10 MG tablet Take 1 tablet (10 mg total) by mouth daily. 11/11/21 02/12/23  DahalMelina Schools, MD  BAQSIMI TWO PACK 3 MG/DOSE POWD Place 1 spray into both nostrils daily. 05/14/23   [provider]  blood glucose meter kit and supplies KIT Dispense based on patient and insurance preference. Use up to four times daily as directed. (FOR ICD-9 250.00, 250.01). 05/25/17   Jacalyn Lefevre, MD  Blood Pressure Monitoring (BLOOD PRESSURE KIT)  DEVI 1 Device by Does not apply route as needed. 05/06/19   Anyanwu, Jethro Bastos, MD  carvedilol (COREG) 25 MG tablet Take 25 mg by mouth 2 (two) times daily with a meal. 05/15/23   [provider]  Continuous Blood Gluc Sensor (DEXCOM G6 SENSOR) MISC Change sensors every 10 days 07/02/22   Shamleffer, Konrad Dolores, MD  Continuous Blood Gluc Transmit (DEXCOM G6 TRANSMITTER) MISC Inject 1 Device into the skin as directed. Use to check blood sugar daily 07/02/22   Shamleffer, Konrad Dolores, MD   Continuous Glucose Sensor (DEXCOM G7 SENSOR) MISC Change sensors every 10 days 09/07/22   Shamleffer, Konrad Dolores, MD  famotidine (PEPCID) 20 MG tablet Take 1 tablet by mouth daily. 01/18/22 06/13/23  [provider]  FLUoxetine (PROZAC) 20 MG capsule Take 1 capsule by mouth daily. 03/23/23   [provider]  hydrOXYzine (ATARAX) 25 MG tablet Take 25 mg by mouth every 4 (four) hours as needed. 05/14/23   [provider]  insulin aspart (NOVOLOG) 100 UNIT/ML injection Max daily 50 units .Uses insulin pump 09/07/22   Shamleffer, Konrad Dolores, MD  Insulin Aspart FlexPen (NOVOLOG) 100 UNIT/ML Inject 4 Units into the skin 3 (three) times daily. 05/10/23 06/10/23  [provider]  Insulin Disposable Pump (OMNIPOD 5 G6 INTRO, GEN 5,) KIT Change pods every 3 days 01/22/22   Shamleffer, Konrad Dolores, MD  Insulin Disposable Pump (OMNIPOD 5 G6 POD, GEN 5,) MISC CHANGE EVERY 3 DAYS 04/13/22   Shamleffer, Konrad Dolores, MD  Insulin Pen Needle 32G X 4 MM MISC Use as directed 10/07/20   Ghimire, Werner Lean, MD  lamoTRIgine (LAMICTAL) 200 MG tablet Take 200 mg by mouth daily.    [provider]  LANTUS SOLOSTAR 100 UNIT/ML Solostar Pen SMARTSIG:8 Unit(s) SUB-Q Daily    [provider]  LORazepam (ATIVAN) 1 MG tablet Take 1 mg by mouth daily as needed.    [provider]  losartan (COZAAR) 100 MG tablet Take 1 tablet by mouth daily. 03/25/23   [provider]  oxyCODONE-acetaminophen (PERCOCET/ROXICET) 5-325 MG tablet Take 1 tablet by mouth every 4 (four) hours as needed for severe pain. Patient not taking: Reported on 05/20/2023 07/10/22   Dara Lords, PA-C  permethrin (ELIMITE) 5 % cream Apply topically. 05/14/23   [provider]  polyethylene glycol (MIRALAX / GLYCOLAX) 17 g packet Take 17 g by mouth daily as needed. 12/07/22   [provider]  rosuvastatin (CRESTOR) 40 MG tablet Take 40 mg by mouth daily. 05/22/22    [provider]  sevelamer carbonate (RENVELA) 800 MG tablet Take 2,400 mg by mouth 3 (three) times daily with meals.    [provider]  SUMAtriptan (IMITREX) 50 MG tablet Take 50 mg by mouth every 2 (two) hours as needed for migraine or headache. 06/20/22   [provider]    Family History  Problem Relation Age of Onset   Asthma Mother    Carpal tunnel syndrome Mother    Gout Father    Diabetes Paternal Grandmother    Anesthesia problems Neg Hx      Social History   Tobacco Use   Smoking status: Never    Passive exposure: Never   Smokeless tobacco: Never  Vaping Use   Vaping status: Never Used  Substance Use Topics   Alcohol use: Not Currently   Drug use: No    Allergies as of 06/03/2023 - Review Complete 05/20/2023  Allergen Reaction Noted  Cephalexin Anaphylaxis 06/28/2020   Morphine Itching 02/12/2023   Penicillins Hives and Rash 11/11/2010   Benadryl [diphenhydramine] Itching 05/30/2016   Doxycycline Itching 12/10/2017    Review of Systems:    All systems reviewed and negative except where noted in HPI.   Physical Exam:  There were no vitals taken for this visit. No LMP recorded. (Menstrual status: Irregular Periods). Psych:  Alert and cooperative. Normal mood and affect. General:   Alert,  Well-developed, well-nourished, pleasant and cooperative in NAD Head:  Normocephalic and atraumatic. Eyes:  Sclera clear, no icterus.   Conjunctiva pink. Ears:  Normal auditory acuity. Neck:  Supple; no masses or thyromegaly. Lungs:  Respirations even and unlabored.  Clear throughout to auscultation.   No wheezes, crackles, or rhonchi. No acute distress. Heart:  Regular rate and rhythm; no murmurs, clicks, rubs, or gallops. Abdomen:  Normal bowel sounds.  No bruits.  Soft, non-tender and non-distended without masses, hepatosplenomegaly or hernias noted.  No guarding or rebound tenderness.    Neurologic:  Alert and oriented x3;  grossly normal  neurologically. Psych:  Alert and cooperative. Normal mood and affect.  Imaging Studies: DG Chest Port 1 View Result Date: 05/20/2023 CLINICAL DATA:  Nausea and vomiting with pain. EXAM: PORTABLE CHEST 1 VIEW COMPARISON:  02/19/2023 FINDINGS: Low volume film. The cardio pericardial silhouette is enlarged. Interstitial markings are diffusely coarsened with chronic features. Diffuse interstitial opacity with subtle airspace disease in the lower lungs. No acute bony abnormality. Telemetry leads overlie the chest. IMPRESSION: Low volume film with diffuse interstitial and basilar airspace opacities. Findings may reflect infectious/inflammatory etiology although pulmonary edema not excluded. Electronically Signed   By: Kennith Center M.D.   On: 05/20/2023 06:14    Assessment and Plan:   Ashley Freeman is a 33 y.o. y/o female has been referred for ***  Follow up in ***  Dr Wyline Mood MD,MRCP(U.K)    BP check ***

## 2023-06-07 ENCOUNTER — Encounter (INDEPENDENT_AMBULATORY_CARE_PROVIDER_SITE_OTHER): Payer: Self-pay | Admitting: Nurse Practitioner

## 2023-06-07 ENCOUNTER — Ambulatory Visit (INDEPENDENT_AMBULATORY_CARE_PROVIDER_SITE_OTHER): Payer: Medicaid Other

## 2023-06-07 ENCOUNTER — Ambulatory Visit (INDEPENDENT_AMBULATORY_CARE_PROVIDER_SITE_OTHER): Payer: Medicaid Other | Admitting: Nurse Practitioner

## 2023-06-07 VITALS — BP 152/93 | HR 104 | Resp 18 | Ht 63.0 in | Wt >= 6400 oz

## 2023-06-07 DIAGNOSIS — N186 End stage renal disease: Secondary | ICD-10-CM | POA: Diagnosis not present

## 2023-06-07 DIAGNOSIS — Z992 Dependence on renal dialysis: Secondary | ICD-10-CM | POA: Diagnosis not present

## 2023-06-07 DIAGNOSIS — E785 Hyperlipidemia, unspecified: Secondary | ICD-10-CM

## 2023-06-09 NOTE — Progress Notes (Signed)
 Subjective:    Patient ID: Ashley Freeman, female    DOB: [birth year], 32 y.o.   MRN: [id number] Chief Complaint  Patient presents with   Follow-up    NP. HDA/consult. clots on needles. lateef    The patient returns to the office for follow up regarding a problem with their dialysis access.   The patient notes a significant increase in problems with dialysis.  She notes that there was an initial incident where after a bad stick her access will and now she has been having clotting and not been able to do dialysis adequately.  She also endorses having some bleeding episodes.  There are no evidence of steal syndrome currently. The patient denies redness or swelling at the access site. The patient denies fever or chills at home or while on dialysis.  No recent shortening of the patient's walking distance or new symptoms consistent with claudication.  No history of rest pain symptoms. No new ulcers or wounds of the lower extremities have occurred.  The patient denies amaurosis fugax or recent TIA symptoms. There are no recent neurological changes noted. There is no history of DVT, PE or superficial thrombophlebitis. No recent episodes of angina or shortness of breath documented.   Duplex ultrasound of the AV access shows a patent access.  The previously noted stenosis is significantly increased compared to last study.  Flow volume today is 1764 cc/min, this is our first look at the fistula    Review of Systems  Respiratory:  Positive for cough.   Hematological:  Bruises/bleeds easily.  All other systems reviewed and are negative.      Objective:   Physical Exam Vitals reviewed.  HENT:     Head: Normocephalic.  Cardiovascular:     Rate and Rhythm: Normal rate.     Pulses:          Radial pulses are 2+ on the left side.     Arteriovenous access: Left arteriovenous access is present.    Comments: Good thrill and bruit Pulmonary:     Effort: Pulmonary effort is normal.   Skin:    General: Skin is warm and dry.  Neurological:     Mental Status: She is alert and oriented to person, place, and time.  Psychiatric:        Mood and Affect: Mood normal.        Behavior: Behavior normal.        Thought Content: Thought content normal.        Judgment: Judgment normal.     BP (!) 152/93   Pulse (!) 104   Resp 18   Ht 5' 3 (1.6 m)   Wt 132 lb (59.9 kg)   BMI 23.38 kg/m   Past Medical History:  Diagnosis Date   Abscess, gluteal, right 08/24/2013   Anemia 02/19/2012   Bartholin's gland abscess 09/19/2013   Bipolar disorder (HCC)    BV (bacterial vaginosis) 11/24/2015   Depression    Diabetes mellitus type I (HCC) 2001   Diagnosed at age 71 ; Type I   Diarrhea 05/30/2016   DKA (diabetic ketoacidoses) 08/19/2013   Also in 2018   ESRD (end stage renal disease) (HCC)    Gonorrhea 08/2011   Treated in 09/2011   HFrEF (heart failure with reduced ejection fraction) (HCC)    a. 2022 Echo: EF 40%; b. 10/2021 Echo: EF 55%; b. 07/2022 MV: No ischemia. EF 31%; c. 08/2022 Echo: EF 35%, mildly dil RV, sev TR.  History of trichomoniasis 05/31/2016   Hyperlipidemia 03/28/2016   Hypertension    NICM (nonischemic cardiomyopathy) (HCC)    Sepsis (HCC) 09/19/2013    Social History   Socioeconomic History   Marital status: Single    Spouse name: Not on file   Number of children: 0   Years of education: 11th grade   Highest education level: Not on file  Occupational History   Occupation: unemployed    Comment: has never worked  Tobacco Use   Smoking status: Never    Passive exposure: Never   Smokeless tobacco: Never  Vaping Use   Vaping status: Never Used  Substance and Sexual Activity   Alcohol  use: Not Currently   Drug use: No   Sexual activity: Yes    Birth control/protection: None  Other Topics Concern   Not on file  Social History Narrative   Patient lives in [place] mother lives in [place].  Unemployed.  Previously worked for a  customer service manager.  Completed 11 grade working on [employer]. Patient 3 brothers    Social Drivers of Corporate Investment Banker Strain: High Risk (11/22/2022)   Received from [facility]   Overall Financial Resource Strain (CARDIA)    Difficulty of Paying Living Expenses: Hard  Food Insecurity: No Food Insecurity (05/20/2023)   Hunger Vital Sign    Worried About Running Out of Food in the Last Year: Never true    Ran Out of Food in the Last Year: Never true  Transportation Needs: No Transportation Needs (05/20/2023)   PRAPARE - Administrator, Civil Service (Medical): No    Lack of Transportation (Non-Medical): No  Physical Activity: Insufficiently Active (03/02/2022)   Received from [facility]   Exercise Vital Sign    Days of Exercise per Week: 2 days    Minutes of Exercise per Session: 10 min  Stress: No Stress Concern Present (03/08/2023)   Received from [facility] - Occupational Stress Questionnaire    Feeling of Stress : Not at all  Social Connections: Moderately Integrated (05/20/2023)   Social Connection and Isolation Panel [NHANES]    Frequency of Communication with Friends and Family: More than three times a week    Frequency of Social Gatherings with Friends and Family: More than three times a week    Attends Religious Services: 1 to 4 times per year    Active Member of Clubs or Organizations: No    Attends Banker Meetings: 1 to 4 times per year    Marital Status: Never married  Intimate Partner Violence: Not At Risk (05/20/2023)   Humiliation, Afraid, Rape, and Kick questionnaire    Fear of Current or Ex-Partner: No    Emotionally Abused: No    Physically Abused: No    Sexually Abused: No    Past Surgical History:  Procedure Laterality Date   AV FISTULA PLACEMENT Right 07/06/2022   Procedure: ARTERIOVENOUS GRAFT CREATION;  Surgeon: Gretta Lonni PARAS, MD;  Location: [facility] OR;  Service:  Vascular;  Laterality: Right;   CESAREAN SECTION N/A 10/05/2019   Procedure: CESAREAN SECTION;  Surgeon: Izell Harari, MD;  Location: [facility];  Service: Obstetrics;  Laterality: N/A;   INCISION AND DRAINAGE ABSCESS Left 09/28/2019   Procedure: INCISION AND DRAINAGE VULVAR ABCESS;  Surgeon: Edsel Norleen GAILS, MD;  Location: [facility] OR;  Service: Gynecology;  Laterality: Left;   INCISION AND DRAINAGE PERIRECTAL ABSCESS Right 08/18/2013  Procedure: IRRIGATION AND DEBRIDEMENT GLUTEAL ABSCESS;  Surgeon: Axel Filler, MD;  Location: [facility] OR;  Service: General;  Laterality: Right;   INCISION AND DRAINAGE PERIRECTAL ABSCESS Right 09/19/2013   Procedure: IRRIGATION AND DEBRIDEMENT RIGHT GLUTEAL AND LABIAL ABSCESSES;  Surgeon: Axel Filler, MD;  Location: [facility] OR;  Service: General;  Laterality: Right;   INCISION AND DRAINAGE PERIRECTAL ABSCESS Right 09/24/2013   Procedure: IRRIGATION AND DEBRIDEMENT PERIRECTAL ABSCESS;  Surgeon: Cherylynn Ridges, MD;  Location: [facility] OR;  Service: General;  Laterality: Right;    Family History  Problem Relation Age of Onset   Asthma Mother    Carpal tunnel syndrome Mother    Gout Father    Diabetes Paternal Grandmother    Anesthesia problems Neg Hx     Allergies  Allergen Reactions   Cephalexin Anaphylaxis    Has gotten ceftriaxone in the past    Morphine Itching   Penicillins Hives and Rash    Has patient had a PCN reaction causing immediate rash, facial/tongue/throat swelling, SOB or lightheadedness with hypotension: Yes Has patient had a PCN reaction causing severe rash involving mucus membranes or skin necrosis: No Has patient had a PCN reaction that required hospitalization: Yes Has patient had a PCN reaction occurring within the last 10 years: No Spoke with pt - childhood hives told by mom, tried no pcns since, doesn't remember reaction herself    Benadryl [Diphenhydramine] Itching   Doxycycline Itching       Latest Ref Rng & Units 05/21/2023   10:10 AM  05/20/2023    3:56 AM 01/13/2023    5:53 PM  CBC  WBC 4.0 - 10.5 K/uL 6.5  6.8  7.5   Hemoglobin 12.0 - 15.0 g/dL 16.1  09.6  04.5   Hematocrit 36.0 - 46.0 % 38.0  47.7  42.5   Platelets 150 - 400 K/uL 149  165  195       CMP     Component Value Date/Time   NA 130 (L) 05/21/2023 0526   NA 136 07/13/2021 1553   NA 137 11/12/2013 0423   K 4.0 05/21/2023 0526   K 4.2 11/12/2013 0423   CL 99 05/21/2023 0526   CL 107 11/12/2013 0423   CO2 19 (L) 05/21/2023 0526   CO2 23 11/12/2013 0423   GLUCOSE 95 05/21/2023 0526   GLUCOSE 339 (H) 11/12/2013 0423   BUN 61 (H) 05/21/2023 0526   BUN 51 (H) 07/13/2021 1553   BUN 7 11/12/2013 0423   CREATININE 8.93 (H) 05/21/2023 0526   CREATININE 0.59 (L) 11/12/2013 0423   CREATININE 0.51 08/24/2013 0957   CALCIUM 7.5 (L) 05/21/2023 0526   CALCIUM 8.2 (L) 11/12/2013 0423   PROT 7.4 05/20/2023 0356   PROT 5.9 (L) 09/08/2019 0945   PROT 8.0 11/10/2013 2043   ALBUMIN 4.2 05/20/2023 0356   ALBUMIN 2.9 (L) 09/08/2019 0945   ALBUMIN 3.9 11/10/2013 2043   AST 36 05/20/2023 0356   AST 12 (L) 11/10/2013 2043   ALT 25 05/20/2023 0356   ALT 10 (L) 11/10/2013 2043   ALKPHOS 207 (H) 05/20/2023 0356   ALKPHOS 77 11/10/2013 2043   BILITOT 1.4 (H) 05/20/2023 0356   BILITOT <0.2 09/08/2019 0945   BILITOT 0.5 11/10/2013 2043   GFR 27.98 (L) 05/24/2021 1552   EGFR 24 (L) 07/13/2021 1553   GFRNONAA 6 (L) 05/21/2023 0526   GFRNONAA >60 11/12/2013 0423   GFRNONAA >89 08/24/2013 0957     No results found.  Assessment & Plan:   1. End-stage renal disease on hemodialysis (HCC) (Primary) Recommend:  The patient is experiencing increasing problems with their dialysis access.  Patient should have a fistulagram with the intention for intervention.  The intention for intervention is to restore appropriate flow and prevent thrombosis and possible loss of the access.  As well as improve the quality of dialysis therapy.  The risks, benefits and  alternative therapies were reviewed in detail with the patient.  All questions were answered.  The patient agrees to proceed with angio/intervention.    The patient will follow up with me in the office after the procedure.   2. Dyslipidemia Continue statin as ordered and reviewed, no changes at this time   Current Outpatient Medications on File Prior to Visit  Medication Sig Dispense Refill   acetaminophen (TYLENOL) 500 MG tablet Take 1,000 mg by mouth every 6 (six) hours as needed for mild pain or moderate pain.     albuterol (VENTOLIN HFA) 108 (90 Base) MCG/ACT inhaler Inhale 2 puffs into the lungs every 4 (four) hours as needed.     amLODipine (NORVASC) 10 MG tablet Take 1 tablet (10 mg total) by mouth daily. 30 tablet 2   BAQSIMI TWO PACK 3 MG/DOSE POWD Place 1 spray into both nostrils daily.     blood glucose meter kit and supplies KIT Dispense based on patient and insurance preference. Use up to four times daily as directed. (FOR ICD-9 250.00, 250.01). 1 each 0   Blood Pressure Monitoring (BLOOD PRESSURE KIT) DEVI 1 Device by Does not apply route as needed. 1 each 0   carvedilol (COREG) 25 MG tablet Take 25 mg by mouth 2 (two) times daily with a meal.     Continuous Blood Gluc Sensor (DEXCOM G6 SENSOR) MISC Change sensors every 10 days 9 each 3   Continuous Blood Gluc Transmit (DEXCOM G6 TRANSMITTER) MISC Inject 1 Device into the skin as directed. Use to check blood sugar daily 1 each 3   Continuous Glucose Sensor (DEXCOM G7 SENSOR) MISC Change sensors every 10 days 3 each 3   famotidine (PEPCID) 20 MG tablet Take 1 tablet by mouth daily.     FLUoxetine (PROZAC) 20 MG capsule Take 1 capsule by mouth daily.     hydrOXYzine (ATARAX) 25 MG tablet Take 25 mg by mouth every 4 (four) hours as needed.     Insulin Aspart FlexPen (NOVOLOG) 100 UNIT/ML Inject 4 Units into the skin 3 (three) times daily.     lamoTRIgine (LAMICTAL) 200 MG tablet Take 200 mg by mouth daily.     LANTUS SOLOSTAR 100  UNIT/ML Solostar Pen SMARTSIG:8 Unit(s) SUB-Q Daily     LORazepam (ATIVAN) 1 MG tablet Take 1 mg by mouth daily as needed.     losartan (COZAAR) 100 MG tablet Take 1 tablet by mouth daily.     permethrin (ELIMITE) 5 % cream Apply topically.     polyethylene glycol (MIRALAX / GLYCOLAX) 17 g packet Take 17 g by mouth daily as needed.     rosuvastatin (CRESTOR) 40 MG tablet Take 40 mg by mouth daily.     sevelamer carbonate (RENVELA) 800 MG tablet Take 2,400 mg by mouth 3 (three) times daily with meals.     SUMAtriptan (IMITREX) 50 MG tablet Take 50 mg by mouth every 2 (two) hours as needed for migraine or headache.     insulin aspart (NOVOLOG) 100 UNIT/ML injection Max daily 50 units .Uses insulin pump 50 mL  0   Insulin  Disposable Pump (OMNIPOD 5 G6 INTRO, GEN 5,) KIT Change pods every 3 days 1 kit 0   Insulin  Disposable Pump (OMNIPOD 5 G6 POD, GEN 5,) MISC CHANGE EVERY 3 DAYS 30 each 1   Insulin  Pen Needle 32G X 4 MM MISC Use as directed 200 each 0   oxyCODONE -acetaminophen  (PERCOCET/ROXICET) 5-325 MG tablet Take 1 tablet by mouth every 4 (four) hours as needed for severe pain. (Patient not taking: Reported on 05/20/2023) 15 tablet 0   No current facility-administered medications on file prior to visit.    There are no Patient Instructions on file for this visit. No follow-ups on file.   Bexley Mclester E Quantarius Genrich, NP
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
## 2023-06-10 ENCOUNTER — Telehealth (INDEPENDENT_AMBULATORY_CARE_PROVIDER_SITE_OTHER): Payer: Self-pay

## 2023-06-10 NOTE — Telephone Encounter (Signed)
I attempted to contact the patient to schedule a right arm fistulagram with Dr. Wyn Quaker. A message was left for a return call.

## 2023-06-10 NOTE — Telephone Encounter (Signed)
 Spoke with the patient and she is scheduled with Dr. Vonna Guardian for a right arm fistulagram with 6:45 am arrival time to the [facility]. Pre-procedure instructions were discussed and will be sent to Mychart and mailed.

## 2023-06-17 ENCOUNTER — Encounter
Admission: RE | Disposition: A | Discharge status: Home / Self Care | Payer: Self-pay | Source: Ambulatory Visit | Attending: Vascular Surgery

## 2023-06-17 ENCOUNTER — Other Ambulatory Visit: Payer: Self-pay

## 2023-06-17 ENCOUNTER — Encounter: Payer: Self-pay | Admitting: Vascular Surgery

## 2023-06-17 ENCOUNTER — Ambulatory Visit
Admission: RE | Admit: 2023-06-17 | Discharge: 2023-06-17 | Disposition: A | Discharge status: Home / Self Care | Payer: Medicaid Other | Source: Ambulatory Visit | Attending: Vascular Surgery | Admitting: Vascular Surgery

## 2023-06-17 DIAGNOSIS — N186 End stage renal disease: Secondary | ICD-10-CM | POA: Insufficient documentation

## 2023-06-17 DIAGNOSIS — Z992 Dependence on renal dialysis: Secondary | ICD-10-CM | POA: Diagnosis not present

## 2023-06-17 DIAGNOSIS — I5022 Chronic systolic (congestive) heart failure: Secondary | ICD-10-CM | POA: Insufficient documentation

## 2023-06-17 DIAGNOSIS — I132 Hypertensive heart and chronic kidney disease with heart failure and with stage 5 chronic kidney disease, or end stage renal disease: Secondary | ICD-10-CM | POA: Diagnosis not present

## 2023-06-17 DIAGNOSIS — Z9889 Other specified postprocedural states: Secondary | ICD-10-CM | POA: Diagnosis not present

## 2023-06-17 DIAGNOSIS — E1022 Type 1 diabetes mellitus with diabetic chronic kidney disease: Secondary | ICD-10-CM | POA: Diagnosis not present

## 2023-06-17 DIAGNOSIS — E785 Hyperlipidemia, unspecified: Secondary | ICD-10-CM | POA: Diagnosis not present

## 2023-06-17 DIAGNOSIS — Z9641 Presence of insulin pump (external) (internal): Secondary | ICD-10-CM | POA: Diagnosis not present

## 2023-06-17 DIAGNOSIS — Y832 Surgical operation with anastomosis, bypass or graft as the cause of abnormal reaction of the patient, or of later complication, without mention of misadventure at the time of the procedure: Secondary | ICD-10-CM | POA: Insufficient documentation

## 2023-06-17 DIAGNOSIS — T82858A Stenosis of vascular prosthetic devices, implants and grafts, initial encounter: Secondary | ICD-10-CM | POA: Insufficient documentation

## 2023-06-17 DIAGNOSIS — Z79899 Other long term (current) drug therapy: Secondary | ICD-10-CM | POA: Diagnosis not present

## 2023-06-17 DIAGNOSIS — Z794 Long term (current) use of insulin: Secondary | ICD-10-CM | POA: Insufficient documentation

## 2023-06-17 HISTORY — PX: A/V FISTULAGRAM: CATH118298

## 2023-06-17 LAB — POTASSIUM: Potassium: 6 mmol/L — ABNORMAL HIGH (ref 3.5–5.1)

## 2023-06-17 LAB — GLUCOSE, CAPILLARY: Glucose-Capillary: 153 mg/dL — ABNORMAL HIGH (ref 70–99)

## 2023-06-17 LAB — POTASSIUM (ARMC VASCULAR LAB ONLY): Potassium (ARMC vascular lab): 6.1 mmol/L — ABNORMAL HIGH (ref 3.5–5.1)

## 2023-06-17 SURGERY — A/V FISTULAGRAM
Anesthesia: Moderate Sedation | Laterality: Right

## 2023-06-17 MED ORDER — VANCOMYCIN HCL IN DEXTROSE 1-5 GM/200ML-% IV SOLN
INTRAVENOUS | Status: AC
  Filled 2023-06-17: qty 200

## 2023-06-17 MED ORDER — LIDOCAINE-EPINEPHRINE (PF) 1 %-1:200000 IJ SOLN
INTRAMUSCULAR | Status: DC | PRN
  Administered 2023-06-17: 10 mL

## 2023-06-17 MED ORDER — ONDANSETRON HCL 4 MG/2ML IJ SOLN: 4.0000 mg | Freq: Four times a day (QID) | INTRAMUSCULAR | Status: DC | PRN

## 2023-06-17 MED ORDER — SODIUM CHLORIDE 0.9 % IV SOLN: INTRAVENOUS | Status: DC

## 2023-06-17 MED ORDER — HEPARIN SODIUM (PORCINE) 1000 UNIT/ML IJ SOLN
INTRAMUSCULAR | Status: AC
  Filled 2023-06-17: qty 10

## 2023-06-17 MED ORDER — MIDAZOLAM HCL 5 MG/5ML IJ SOLN
INTRAMUSCULAR | Status: AC
  Filled 2023-06-17: qty 5

## 2023-06-17 MED ORDER — IODIXANOL 320 MG/ML IV SOLN
INTRAVENOUS | Status: DC | PRN
  Administered 2023-06-17: 25 mL via INTRA_ARTERIAL

## 2023-06-17 MED ORDER — FENTANYL CITRATE (PF) 100 MCG/2ML IJ SOLN
INTRAMUSCULAR | Status: AC
  Filled 2023-06-17: qty 2

## 2023-06-17 MED ORDER — HYDROMORPHONE HCL 1 MG/ML IJ SOLN: 1.0000 mg | Freq: Once | INTRAMUSCULAR | Status: DC | PRN

## 2023-06-17 MED ORDER — VANCOMYCIN HCL IN DEXTROSE 1-5 GM/200ML-% IV SOLN
1000.0000 mg | INTRAVENOUS | Status: AC
  Administered 2023-06-17: 1000 mg via INTRAVENOUS

## 2023-06-17 MED ORDER — MIDAZOLAM HCL 2 MG/2ML IJ SOLN
INTRAMUSCULAR | Status: DC | PRN
  Administered 2023-06-17: 1 mg via INTRAVENOUS
  Administered 2023-06-17: .5 mg via INTRAVENOUS

## 2023-06-17 MED ORDER — HEPARIN SODIUM (PORCINE) 1000 UNIT/ML IJ SOLN
INTRAMUSCULAR | Status: DC | PRN
  Administered 2023-06-17: 3000 [IU] via INTRAVENOUS

## 2023-06-17 MED ORDER — HEPARIN (PORCINE) IN NACL 1000-0.9 UT/500ML-% IV SOLN
INTRAVENOUS | Status: DC | PRN
  Administered 2023-06-17: 500 mL

## 2023-06-17 MED ORDER — METHYLPREDNISOLONE SODIUM SUCC 125 MG IJ SOLR: 125.0000 mg | Freq: Once | INTRAMUSCULAR | Status: DC | PRN

## 2023-06-17 MED ORDER — FAMOTIDINE 20 MG PO TABS: 40.0000 mg | ORAL_TABLET | Freq: Once | ORAL | Status: DC | PRN

## 2023-06-17 MED ORDER — FENTANYL CITRATE (PF) 100 MCG/2ML IJ SOLN
INTRAMUSCULAR | Status: DC | PRN
  Administered 2023-06-17: 25 ug via INTRAVENOUS
  Administered 2023-06-17: 50 ug via INTRAVENOUS

## 2023-06-17 MED ORDER — MIDAZOLAM HCL 2 MG/ML PO SYRP: 8.0000 mg | ORAL_SOLUTION | Freq: Once | ORAL | Status: DC | PRN

## 2023-06-17 MED ORDER — DIPHENHYDRAMINE HCL 50 MG/ML IJ SOLN: 50.0000 mg | Freq: Once | INTRAMUSCULAR | Status: DC | PRN

## 2023-06-17 SURGICAL SUPPLY — 12 items
BALLN LUTONIX AV 9X60X75 (BALLOONS) ×1 IMPLANT
BALLN LUTONIX DCB 7X60X130 (BALLOONS) ×1 IMPLANT
BALLOON LUTONIX AV 9X60X75 (BALLOONS) IMPLANT
BALLOON LUTONIX DCB 7X60X130 (BALLOONS) IMPLANT
CANNULA 5F STIFF (CANNULA) IMPLANT
COVER PROBE ULTRASOUND 5X96 (MISCELLANEOUS) IMPLANT
DEVICE PRESTO INFLATION (MISCELLANEOUS) IMPLANT
DRAPE BRACHIAL (DRAPES) IMPLANT
PACK ANGIOGRAPHY (CUSTOM PROCEDURE TRAY) ×1 IMPLANT
SHEATH BRITE TIP 6FRX5.5 (SHEATH) IMPLANT
SUT MNCRL AB 4-0 PS2 18 (SUTURE) IMPLANT
WIRE SUPRACORE 190CM (WIRE) IMPLANT

## 2023-06-17 NOTE — Interval H&P Note (Signed)
History and Physical Interval Note:  06/17/2023 8:13 AM  Ashley Freeman  has presented today for surgery, with the diagnosis of R arm fistulagram   End Stage Renal.  The various methods of treatment have been discussed with the patient and family. After consideration of risks, benefits and other options for treatment, the patient has consented to  Procedure(s): A/V Fistulagram (Right) as a surgical intervention.  The patient's history has been reviewed, patient examined, no change in status, stable for surgery.  I have reviewed the patient's chart and labs.  Questions were answered to the patient's satisfaction.     Festus Barren

## 2023-06-17 NOTE — Op Note (Signed)
[facility]    OPERATIVE NOTE   PROCEDURE: 1.  Right brachial artery to axillary vein arteriovenous graft cannulation under ultrasound guidance 2.  Right arm shuntogram 3.  Percutaneous transluminal right brachial artery to axillary vein AV graft at the leading edge of the stent within the AV graft with 7 mm diameter by 6 cm length Lutonix drug-coated angioplasty balloon 4.  Percutaneous transluminal angioplasty of the right subclavian vein with 9 mm diameter by 6 cm length Lutonix drug-coated angioplasty balloon  PRE-OPERATIVE DIAGNOSIS: 1. ESRD 2. Malfunctioning right brachial artery to axillary vein arteriovenous graft  POST-OPERATIVE DIAGNOSIS: same as above   SURGEON: Festus Barren, MD  ANESTHESIA: local with MCS  ESTIMATED BLOOD LOSS:  3 cc  FINDING(S): The AV graft had a hyperplastic stenosis at the leading edge of the stent within the AV graft creating a 60 to 70% stenosis.  The distal edge of the stent within the axillary vein had about a 30% stenosis.  More centrally in the subclavian vein, there was a vein valve stenosis that was a napkin ring like lesion.  It was difficult to decipher the degree of stenosis but it appeared to be greater than 70%.  The superior vena cava and innominate veins were patent.  SPECIMEN(S):  None  CONTRAST: 25 cc  FLUORO TIME: 1.1 minutes  MODERATE CONSCIOUS SEDATION TIME:  Approximately 20 minutes using 1.5 mg of Versed and 75 mcg of Fentanyl  INDICATIONS: Ashley Freeman is a 33 y.o. female who presents with malfunctioning right arm brachial to axillary arteriovenous graft.  The patient is scheduled for right arm shuntogram.  The patient is aware the risks include but are not limited to: bleeding, infection, thrombosis of the cannulated access, and possible anaphylactic reaction to the contrast.  The patient is aware of the risks of the procedure and elects to proceed forward.  DESCRIPTION: After full  informed written consent was obtained, the patient was brought back to the angiography suite and placed supine upon the angiography table.  The patient was connected to monitoring equipment. Moderate conscious sedation was administered during a face to face encounter throughout the procedure with my supervision of the RN administering medicines and monitoring the patient's vital signs, pulse oximetry, telemetry and mental status throughout from the start of the procedure until the patient was taken to the recovery room The right arm was prepped and draped in the standard fashion for a percutaneous access intervention.  Under ultrasound guidance, the right brachial artery to axillary vein arteriovenous graft was cannulated with a micropuncture needle under direct ultrasound guidance were it was patent and a permanent image was performed.  The microwire was advanced into the graft and the needle was exchanged for the a microsheath.  I then upsized to a 6 Fr Sheath and imaging was performed.  Hand injections were completed to image the access including the central venous system. This demonstrated that the AV graft had a hyperplastic stenosis at the leading edge of the stent within the AV graft creating a 60 to 70% stenosis.  The distal edge of the stent within the axillary vein had about a 30% stenosis.  More centrally in the subclavian vein, there was a vein valve stenosis that was a napkin ring like lesion.  It was difficult to decipher the degree of stenosis but it appeared to be greater than 70%.  The superior vena cava and innominate veins were patent.  Based on the images, this patient will need  intervention to both the stenosis within the AV graft as well as the subclavian vein stenosis. I then gave the patient 3000 units of intravenous heparin.  I then crossed the stenosis with a supra core wire.  Based on the imaging, a 7 mm x 6 cm Lutonix drug-coated angioplasty balloon was selected for the lesion within  the AV graft at the leading edge of the previously placed stent.  The balloon was centered around the stenosis and inflated to 12 ATM for 1 minute(s).  On completion imaging, a 10-15% residual stenosis was present.  I then turned my attention to the central vein stenosis.  A 9 mm diameter by 6 cm length Lutonix drug-coated angioplasty balloon was inflated to 6 atm for 1 minute.  Completion imaging showed resolution of the vein valve stenosis and no significant residual stenosis.   Based on the completion imaging, no further intervention is necessary.  The wire and balloon were removed from the sheath.  A 4-0 Monocryl purse-string suture was sewn around the sheath.  The sheath was removed while tying down the suture.  A sterile bandage was applied to the puncture site.  COMPLICATIONS: None  CONDITION: Stable   Festus Barren  06/17/2023 8:56 AM    This note was created with Dragon Medical transcription system. Any errors in dictation are purely unintentional.

## 2023-06-20 ENCOUNTER — Encounter: Payer: Self-pay | Admitting: Vascular Surgery

## 2023-06-23 ENCOUNTER — Other Ambulatory Visit: Payer: Self-pay

## 2023-06-23 ENCOUNTER — Emergency Department (HOSPITAL_COMMUNITY): Payer: Medicaid Other

## 2023-06-23 ENCOUNTER — Encounter (HOSPITAL_COMMUNITY): Payer: Self-pay

## 2023-06-23 ENCOUNTER — Emergency Department (HOSPITAL_COMMUNITY)
Admission: EM | Admit: 2023-06-23 | Discharge: 2023-06-23 | Disposition: A | Discharge status: Home / Self Care | Payer: Medicaid Other | Attending: Emergency Medicine | Admitting: Emergency Medicine

## 2023-06-23 DIAGNOSIS — N186 End stage renal disease: Secondary | ICD-10-CM | POA: Insufficient documentation

## 2023-06-23 DIAGNOSIS — E1065 Type 1 diabetes mellitus with hyperglycemia: Secondary | ICD-10-CM | POA: Insufficient documentation

## 2023-06-23 DIAGNOSIS — I132 Hypertensive heart and chronic kidney disease with heart failure and with stage 5 chronic kidney disease, or end stage renal disease: Secondary | ICD-10-CM | POA: Insufficient documentation

## 2023-06-23 DIAGNOSIS — R112 Nausea with vomiting, unspecified: Secondary | ICD-10-CM | POA: Diagnosis present

## 2023-06-23 DIAGNOSIS — K529 Noninfective gastroenteritis and colitis, unspecified: Secondary | ICD-10-CM | POA: Diagnosis not present

## 2023-06-23 DIAGNOSIS — I5020 Unspecified systolic (congestive) heart failure: Secondary | ICD-10-CM | POA: Insufficient documentation

## 2023-06-23 DIAGNOSIS — Z79899 Other long term (current) drug therapy: Secondary | ICD-10-CM | POA: Diagnosis not present

## 2023-06-23 DIAGNOSIS — Z794 Long term (current) use of insulin: Secondary | ICD-10-CM | POA: Insufficient documentation

## 2023-06-23 DIAGNOSIS — Z992 Dependence on renal dialysis: Secondary | ICD-10-CM | POA: Diagnosis not present

## 2023-06-23 DIAGNOSIS — E1022 Type 1 diabetes mellitus with diabetic chronic kidney disease: Secondary | ICD-10-CM | POA: Insufficient documentation

## 2023-06-23 LAB — URINALYSIS, ROUTINE W REFLEX MICROSCOPIC
Bilirubin Urine: NEGATIVE
Glucose, UA: 150 mg/dL — AB
Hgb urine dipstick: NEGATIVE
Ketones, ur: 5 mg/dL — AB
Leukocytes,Ua: NEGATIVE
Nitrite: NEGATIVE
Protein, ur: >=300 mg/dL — AB
Specific Gravity, Urine: 1.019 (ref 1.005–1.030)
pH: 8 (ref 5.0–8.0)

## 2023-06-23 LAB — COMPREHENSIVE METABOLIC PANEL
ALT: 51 U/L — ABNORMAL HIGH (ref 0–44)
AST: 54 U/L — ABNORMAL HIGH (ref 15–41)
Albumin: 3.7 g/dL (ref 3.5–5.0)
Alkaline Phosphatase: 219 U/L — ABNORMAL HIGH (ref 38–126)
Anion gap: 18 — ABNORMAL HIGH (ref 5–15)
BUN: 24 mg/dL — ABNORMAL HIGH (ref 6–20)
CO2: 23 mmol/L (ref 22–32)
Calcium: 8.4 mg/dL — ABNORMAL LOW (ref 8.9–10.3)
Chloride: 98 mmol/L (ref 98–111)
Creatinine, Ser: 6.42 mg/dL — ABNORMAL HIGH (ref 0.44–1.00)
GFR, Estimated: 8 mL/min — ABNORMAL LOW (ref >60)
Glucose, Bld: 171 mg/dL — ABNORMAL HIGH (ref 70–99)
Potassium: 4.2 mmol/L (ref 3.5–5.1)
Sodium: 139 mmol/L (ref 135–145)
Total Bilirubin: 1.2 mg/dL (ref 0.0–1.2)
Total Protein: 6.6 g/dL (ref 6.5–8.1)

## 2023-06-23 LAB — CBG MONITORING, ED
Glucose-Capillary: 175 mg/dL — ABNORMAL HIGH (ref 70–99)
Glucose-Capillary: 150 mg/dL — ABNORMAL HIGH (ref 70–99)
Glucose-Capillary: 143 mg/dL — ABNORMAL HIGH (ref 70–99)

## 2023-06-23 LAB — RESP PANEL BY RT-PCR (RSV, FLU A&B, COVID)  RVPGX2
Influenza A by PCR: NEGATIVE
Influenza B by PCR: NEGATIVE
Resp Syncytial Virus by PCR: NEGATIVE
SARS Coronavirus 2 by RT PCR: NEGATIVE

## 2023-06-23 LAB — CBC
HCT: 42.3 % (ref 36.0–46.0)
Hemoglobin: 13 g/dL (ref 12.0–15.0)
MCH: 25.8 pg — ABNORMAL LOW (ref 26.0–34.0)
MCHC: 30.7 g/dL (ref 30.0–36.0)
MCV: 84.1 fL (ref 80.0–100.0)
Platelets: 172 10*3/uL (ref 150–400)
RBC: 5.03 MIL/uL (ref 3.87–5.11)
RDW: 15.5 % (ref 11.5–15.5)
WBC: 6.4 10*3/uL (ref 4.0–10.5)
nRBC: 0 % (ref 0.0–0.2)

## 2023-06-23 LAB — HCG, QUANTITATIVE, PREGNANCY: hCG, Beta Chain, Quant, S: 1 m[IU]/mL (ref <5)

## 2023-06-23 LAB — I-STAT CG4 LACTIC ACID, ED
Lactic Acid, Venous: 2.2 mmol/L (ref 0.5–1.9)
Lactic Acid, Venous: 1.7 mmol/L (ref 0.5–1.9)

## 2023-06-23 LAB — LIPASE, BLOOD: Lipase: 21 U/L (ref 11–51)

## 2023-06-23 LAB — MAGNESIUM: Magnesium: 2.8 mg/dL — ABNORMAL HIGH (ref 1.7–2.4)

## 2023-06-23 MED ORDER — FENTANYL CITRATE PF 50 MCG/ML IJ SOSY
50.0000 ug | PREFILLED_SYRINGE | Freq: Once | INTRAMUSCULAR | Status: AC
  Administered 2023-06-23: 50 ug via INTRAVENOUS
  Filled 2023-06-23: qty 1

## 2023-06-23 MED ORDER — ONDANSETRON HCL 4 MG/2ML IJ SOLN
4.0000 mg | Freq: Once | INTRAMUSCULAR | Status: AC
  Administered 2023-06-23: 4 mg via INTRAVENOUS
  Filled 2023-06-23: qty 2

## 2023-06-23 MED ORDER — HYDROXYZINE HCL 25 MG PO TABS
25.0000 mg | ORAL_TABLET | Freq: Once | ORAL | Status: AC
  Administered 2023-06-23: 25 mg via ORAL
  Filled 2023-06-23: qty 1

## 2023-06-23 MED ORDER — DIPHENHYDRAMINE HCL 50 MG/ML IJ SOLN
25.0000 mg | Freq: Once | INTRAMUSCULAR | Status: DC
  Filled 2023-06-23: qty 1

## 2023-06-23 MED ORDER — INSULIN ASPART 100 UNIT/ML IJ SOLN: 0.0000 [IU] | INTRAMUSCULAR | Status: DC

## 2023-06-23 MED ORDER — ONDANSETRON 4 MG PO TBDP: 4.0000 mg | ORAL_TABLET | Freq: Three times a day (TID) | ORAL | 0 refills | Status: DC | PRN

## 2023-06-23 MED ORDER — SODIUM CHLORIDE 0.9 % IV BOLUS
500.0000 mL | Freq: Once | INTRAVENOUS | Status: AC
  Administered 2023-06-23: 500 mL via INTRAVENOUS

## 2023-06-23 MED ORDER — ONDANSETRON 4 MG PO TBDP: 4.0000 mg | ORAL_TABLET | Freq: Once | ORAL | Status: DC

## 2023-06-23 NOTE — ED Notes (Signed)
 Pt provided discharge instructions and prescription information. Pt was given the opportunity to ask questions and questions were answered.
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
## 2023-06-23 NOTE — Discharge Instructions (Signed)
 Your CT scan showed inflammation of your small intestine as well as some fluid in your abdomen.  I suspect your symptoms are due to this inflammation.  I gave you prescription for nausea medicine.  You should continue taking your other medications as prescribed and make sure you are drinking plenty of fluids.  If you develop worsening pain, inability eat or drink, blood in your stool, chest pain, difficulty breathing, or fever you need to return to the ED.  You should follow-up with a gastroenterologist as well, you can call the number above.

## 2023-06-23 NOTE — ED Provider Triage Note (Addendum)
 Emergency Medicine Provider Triage Evaluation Note  Ashley Freeman , a 33 y.o. female  was evaluated in triage.  Pt complains of nausea, vomiting, weakness.  Review of Systems  Positive:  Negative:   Physical Exam  BP (!) 154/105   Pulse 84   Temp 97.8 F (36.6 C) (Oral)   Resp 20   SpO2 100%  Gen:   Awake, no distress   Resp:  Normal effort  MSK:   Moves extremities without difficulty  Other:    Medical Decision Making  Medically screening exam initiated at 1:53 PM.  Appropriate orders placed.  Ashley Freeman was informed that the remainder of the evaluation will be completed by another provider, this initial triage assessment does not replace that evaluation, and the importance of remaining in the ED until their evaluation is complete.  Patient stating that she got 1.5hrs of her dialysis treatment today. Patient has been having nausea, vomiting, and weakness x2 days. Also with coughing, congestion, rhinorrhea. Last episode of vomiting with EMS.    EMS did not give patient Zofran because their EKG was showing long QTc    Dorthy Cooler, [place] 06/23/23 1356

## 2023-06-23 NOTE — ED Provider Notes (Signed)
 [place] [facility] AT [facility] Provider Note   CSN: [id number] Arrival date & time: 06/23/23  1323     History  Chief Complaint  Patient presents with   Nausea   Weakness    Lurine Imel [place]-B[place] is a 33 y.o. female.   Weakness 33 year old female history of type 1 diabetes, ESRD on dialysis, heart failure with reduced ejection fraction, hypertension presenting for vomiting.  She states for last 2 days she has had intermittent nausea, nonbloody emesis.  She is felt weak and clammy and reports body aches.  Denies chest pain or abdominal pain.  No diarrhea.  Normal bowel movements.  Had most of her session of dialysis today but had to stop early due to nausea.  She does still make urine.     Home Medications Prior to Admission medications   Medication Sig Start Date End Date Taking? Authorizing Provider  ondansetron (ZOFRAN-ODT) 4 MG disintegrating tablet Take 1 tablet (4 mg total) by mouth every 8 (eight) hours as needed for nausea or vomiting. 06/23/23  Yes Laurence Spates, MD  acetaminophen (TYLENOL) 500 MG tablet Take 1,000 mg by mouth every 6 (six) hours as needed for mild pain or moderate pain.    [provider]  albuterol (VENTOLIN HFA) 108 (90 Base) MCG/ACT inhaler Inhale 2 puffs into the lungs every 4 (four) hours as needed. 11/24/22   [provider]  amLODipine (NORVASC) 10 MG tablet Take 1 tablet (10 mg total) by mouth daily. 11/11/21 06/06/25  Lorin Glass, MD  BAQSIMI TWO PACK 3 MG/DOSE POWD Place 1 spray into both nostrils daily. 05/14/23   [provider]  blood glucose meter kit and supplies KIT Dispense based on patient and insurance preference. Use up to four times daily as directed. (FOR ICD-9 250.00, 250.01). 05/25/17   Jacalyn Lefevre, MD  Blood Pressure Monitoring (BLOOD PRESSURE KIT) DEVI 1 Device by Does not apply route as needed. 05/06/19   Anyanwu, Jethro Bastos, MD  carvedilol (COREG) 25 MG tablet Take 25 mg  by mouth 2 (two) times daily with a meal. 05/15/23   [provider]  Continuous Blood Gluc Sensor (DEXCOM G6 SENSOR) MISC Change sensors every 10 days 07/02/22   Shamleffer, Konrad Dolores, MD  Continuous Blood Gluc Transmit (DEXCOM G6 TRANSMITTER) MISC Inject 1 Device into the skin as directed. Use to check blood sugar daily 07/02/22   Shamleffer, Konrad Dolores, MD  Continuous Glucose Sensor (DEXCOM G7 SENSOR) MISC Change sensors every 10 days 09/07/22   Shamleffer, Konrad Dolores, MD  FLUoxetine (PROZAC) 20 MG capsule Take 1 capsule by mouth daily. 03/23/23   [provider]  hydrOXYzine (ATARAX) 25 MG tablet Take 25 mg by mouth every 4 (four) hours as needed. 05/14/23   [provider]  Insulin Aspart FlexPen (NOVOLOG) 100 UNIT/ML Inject 4 Units into the skin 3 (three) times daily. 05/10/23 06/23/23  [provider]  lamoTRIgine (LAMICTAL) 200 MG tablet Take 200 mg by mouth daily.    [provider]  LANTUS SOLOSTAR 100 UNIT/ML Solostar Pen SMARTSIG:8 Unit(s) SUB-Q Daily    [provider]  LORazepam (ATIVAN) 1 MG tablet Take 1 mg by mouth daily as needed.    [provider]  losartan (COZAAR) 100 MG tablet Take 1 tablet by mouth daily. 03/25/23   [provider]  oxyCODONE-acetaminophen (PERCOCET/ROXICET) 5-325 MG tablet Take 1 tablet by mouth every 4 (four) hours as needed for severe pain. 07/10/22   Doreatha Massed  J, PA-C  permethrin (ELIMITE) 5 % cream Apply topically. 05/14/23   [provider]  polyethylene glycol (MIRALAX / GLYCOLAX) 17 g packet Take 17 g by mouth daily as needed. 12/07/22   [provider]  rosuvastatin (CRESTOR) 40 MG tablet Take 40 mg by mouth daily. 05/22/22   [provider]  sevelamer carbonate (RENVELA) 800 MG tablet Take 2,400 mg by mouth 3 (three) times daily with meals.    [provider]  SUMAtriptan (IMITREX) 50 MG tablet Take 50 mg by mouth every 2 (two) hours as  needed for migraine or headache. 06/20/22   [provider]      Allergies    Cephalexin, Morphine, Penicillins, Benadryl [diphenhydramine], and Doxycycline    Review of Systems   Review of Systems  Neurological:  Positive for weakness.  Review of systems completed and notable as per HPI.  ROS otherwise negative.   Physical Exam Updated Vital Signs BP (!) 163/107   Pulse 80   Temp 98.2 F (36.8 C)   Resp 19   SpO2 96%  Physical Exam Vitals and nursing note reviewed.  Constitutional:      General: She is not in acute distress.    Appearance: She is well-developed.  HENT:     Head: Normocephalic and atraumatic.  Eyes:     Conjunctiva/sclera: Conjunctivae normal.  Cardiovascular:     Rate and Rhythm: Normal rate and regular rhythm.     Pulses: Normal pulses.     Heart sounds: Normal heart sounds. No murmur heard. Pulmonary:     Effort: Pulmonary effort is normal. No respiratory distress.     Breath sounds: Normal breath sounds.  Abdominal:     Palpations: Abdomen is soft.     Tenderness: There is abdominal tenderness. There is no guarding or rebound.     Comments: Mild diffuse tenderness  Musculoskeletal:        General: No swelling.     Cervical back: Neck supple.     Right lower leg: No edema.     Left lower leg: No edema.  Skin:    General: Skin is warm and dry.     Capillary Refill: Capillary refill takes less than 2 seconds.  Neurological:     Mental Status: She is alert.  Psychiatric:        Mood and Affect: Mood normal.     ED Results / Procedures / Treatments   Labs (all labs ordered are listed, but only abnormal results are displayed) Labs Reviewed  CBC - Abnormal; Notable for the following components:      Result Value   MCH 25.8 (*)    All other components within normal limits  COMPREHENSIVE METABOLIC PANEL - Abnormal; Notable for the following components:   Glucose, Bld 171 (*)    BUN 24 (*)    Creatinine, Ser 6.42 (*)    Calcium 8.4  (*)    AST 54 (*)    ALT 51 (*)    Alkaline Phosphatase 219 (*)    GFR, Estimated 8 (*)    Anion gap 18 (*)    All other components within normal limits  URINALYSIS, ROUTINE W REFLEX MICROSCOPIC - Abnormal; Notable for the following components:   APPearance HAZY (*)    Glucose, UA 150 (*)    Ketones, ur 5 (*)    Protein, ur >=300 (*)    Bacteria, UA RARE (*)    All other components within normal limits  MAGNESIUM -  Abnormal; Notable for the following components:   Magnesium 2.8 (*)    All other components within normal limits  CBG MONITORING, ED - Abnormal; Notable for the following components:   Glucose-Capillary 175 (*)    All other components within normal limits  I-STAT CG4 LACTIC ACID, ED - Abnormal; Notable for the following components:   Lactic Acid, Venous 2.2 (*)    All other components within normal limits  CBG MONITORING, ED - Abnormal; Notable for the following components:   Glucose-Capillary 150 (*)    All other components within normal limits  CBG MONITORING, ED - Abnormal; Notable for the following components:   Glucose-Capillary 143 (*)    All other components within normal limits  RESP PANEL BY RT-PCR (RSV, FLU A&B, COVID)  RVPGX2  LIPASE, BLOOD  HCG, QUANTITATIVE, PREGNANCY  I-STAT CG4 LACTIC ACID, ED    EKG EKG Interpretation Date/Time:  Sunday June 23 2023 13:30:37 EST Ventricular Rate:  84 PR Interval:  150 QRS Duration:  72 QT Interval:  426 QTC Calculation: 503 R Axis:   -1  Text Interpretation: Normal sinus rhythm Possible Left atrial enlargement Nonspecific T wave abnormality Prolonged QT Abnormal ECG When compared with ECG of 20-May-2023 04:07, PREVIOUS ECG IS PRESENT Confirmed by Fulton Reek [phone]) on 06/23/2023 3:44:33 PM  Radiology CT ABDOMEN PELVIS WO CONTRAST Result Date: 06/23/2023 CLINICAL DATA:  Nausea, vomiting and weakness x2 days. EXAM: CT ABDOMEN AND PELVIS WITHOUT CONTRAST TECHNIQUE: Multidetector CT imaging of the  abdomen and pelvis was performed following the standard protocol without IV contrast. RADIATION DOSE REDUCTION: This exam was performed according to the departmental dose-optimization program which includes automated exposure control, adjustment of the mA and/or kV according to patient size and/or use of iterative reconstruction technique. COMPARISON:  December 13, 2022 FINDINGS: Lower chest: There is stable mild to moderate severity cardiomegaly. Mild atelectatic changes are seen within the left lung base. Hepatobiliary: No focal liver abnormality is seen. A thin layer of tiny gallstones is seen within the dependent portion of a moderately distended gallbladder. There is no evidence of gallbladder wall thickening, biliary dilatation or peripancreatic inflammatory fat stranding. Pancreas: Unremarkable. No pancreatic ductal dilatation or surrounding inflammatory changes. Spleen: Normal in size without focal abnormality. Adrenals/Urinary Tract: Adrenal glands are unremarkable. Kidneys are normal, without renal calculi, focal lesion, or hydronephrosis. The urinary bladder is poorly distended and subsequently limited in evaluation. Mild diffuse urinary bladder wall thickening is seen. Stomach/Bowel: Stomach is within normal limits. Appendix appears normal. No evidence of bowel dilatation. Moderately thickened and inflamed small bowel is seen involving predominantly the mid to distal jejunum and ileum. Vascular/Lymphatic: Stable marked severity calcification of the mesenteric arteries is seen. No enlarged abdominal or pelvic lymph nodes. Reproductive: Uterus and bilateral adnexa are unremarkable. Other: No abdominal wall hernia or abnormality. Mild to moderate severity abdominopelvic ascites is noted. This is increased in severity when compared to the prior exam. Musculoskeletal: No acute or significant osseous findings. IMPRESSION: 1. Moderate severity infectious versus inflammatory enteritis involving predominantly the  mid to distal jejunum and ileum. 2. Mild to moderate severity abdominopelvic ascites, increased in severity when compared to the prior exam. 3. Cholelithiasis. Electronically Signed   By: Aram Candela M.D.   On: 06/23/2023 17:29   DG Chest 2 View Result Date: 06/23/2023 CLINICAL DATA:  Cough. EXAM: CHEST - 2 VIEW COMPARISON:  May 20, 2023. FINDINGS: Stable cardiomegaly. Both lungs are clear. The visualized skeletal structures are unremarkable. IMPRESSION: No active cardiopulmonary disease.  Electronically Signed   By: Lupita Raider M.D.   On: 06/23/2023 14:54    Procedures Procedures    Medications Ordered in ED Medications  insulin aspart (novoLOG) injection 0-6 Units ( Subcutaneous Not Given 06/23/23 1905)  sodium chloride 0.9 % bolus 500 mL ( Intravenous Stopped 06/23/23 1823)  fentaNYL (SUBLIMAZE) injection 50 mcg (50 mcg Intravenous Given 06/23/23 1751)  ondansetron (ZOFRAN) injection 4 mg (4 mg Intravenous Given 06/23/23 1810)  hydrOXYzine (ATARAX) tablet 25 mg (25 mg Oral Given 06/23/23 1859)    ED Course/ Medical Decision Making/ A&P Clinical Course as of 06/23/23 2127  Sun Jun 23, 2023  2003 CT scan shows enteritis, some ascites which she has had before as well as cholelithiasis.  On reassessment she feels well.  He is tolerating p.o., nausea is improved, she has no abdominal pain.  Her lab work is notable for mild hyperglycemia.  She initial elevated anion gap and normal bicarb.  She has minimal ketones in her urine and normal bicarb and I suspect her anion gap is likely due to mild lactic acidosis from dehydration.  This has resolved with a small fluid bolus.  I do not think this is DKA given only minimal hyperglycemia as well.  I suspect her symptoms are primarily due to the enteritis seen on CT.  I suspect the ascites is likely reactive and she has had this before.  I have low suspicion for SBP given no history of cirrhosis and no abdominal pain.  She would like to try to go  home which I think is reasonable.  I recommend she follow close with her doctor.  Strict return precautions were given.  I did give her GI follow-up as she does not follow with anyone.  She is comfortable with this plan. [JD]    Clinical Course User Index [JD] Laurence Spates, MD                                 Medical Decision Making Amount and/or Complexity of Data Reviewed Labs: ordered. Radiology: ordered.  Risk Prescription drug management.   Medical Decision Making:   RYKA BEIGHLEY is a 33 y.o. female who presented to the ED today with vomiting.  No signs reviewed.  On exam she is overall well-appearing.  She is chronically ill with complex medical history.  She has mild diffuse abdominal tenderness, I suspect her symptoms are more likely gastroenteritis but we need to rule out DKA, appendicitis, cholecystitis, obstruction.   Patient placed on continuous vitals and telemetry monitoring while in ED which was reviewed periodically.  Reviewed and confirmed nursing documentation for past medical history, family history, social history.  Reassessment and Plan:   CT scan shows enteritis, some ascites which she has had before as well as cholelithiasis.  On reassessment she feels well.  He is tolerating p.o., nausea is improved, she has no abdominal pain.  No abdominal tenderness.  She has no tenderness in the right upper quadrant, she has mild transaminitis but I suspect is more likely reactive given no right upper quadrant abdominal pain or abdominal pain at all have lower suspicion for cholecystitis.  Her lab work is notable for mild hyperglycemia.  She initial elevated anion gap and normal bicarb.  She has minimal ketones in her urine and normal bicarb and I suspect her anion gap is likely due to mild lactic acidosis from dehydration.  This has resolved  with a small fluid bolus.  I do not think this is DKA given only minimal hyperglycemia as well.  I suspect her symptoms  are primarily due to the enteritis seen on CT.  I suspect the ascites is likely reactive and she has had this before.  I have low suspicion for SBP given no history of cirrhosis and no abdominal pain.  She would like to try to go home which I think is reasonable.  I recommend she follow close with her doctor.  Strict return precautions were given.  I did give her GI follow-up as she does not follow with anyone.  She is comfortable with this plan.    Patient's presentation is most consistent with acute complicated illness / injury requiring diagnostic workup.           Final Clinical Impression(s) / ED Diagnoses Final diagnoses:  Enteritis    Rx / DC Orders ED Discharge Orders          Ordered    ondansetron (ZOFRAN-ODT) 4 MG disintegrating tablet  Every 8 hours PRN        06/23/23 2004              Laurence Spates, MD 06/23/23 2127

## 2023-06-30 ENCOUNTER — Emergency Department (HOSPITAL_COMMUNITY)

## 2023-06-30 ENCOUNTER — Inpatient Hospital Stay (HOSPITAL_COMMUNITY)
Admission: EM | Admit: 2023-06-30 | Discharge: 2023-07-08 | DRG: 417 | Disposition: A | Discharge status: Home / Self Care | Attending: Internal Medicine | Admitting: Internal Medicine

## 2023-06-30 ENCOUNTER — Encounter (HOSPITAL_COMMUNITY): Payer: Self-pay | Admitting: *Deleted

## 2023-06-30 ENCOUNTER — Other Ambulatory Visit: Payer: Self-pay

## 2023-06-30 DIAGNOSIS — N762 Acute vulvitis: Secondary | ICD-10-CM | POA: Diagnosis present

## 2023-06-30 DIAGNOSIS — N2581 Secondary hyperparathyroidism of renal origin: Secondary | ICD-10-CM | POA: Diagnosis present

## 2023-06-30 DIAGNOSIS — K8012 Calculus of gallbladder with acute and chronic cholecystitis without obstruction: Principal | ICD-10-CM | POA: Diagnosis present

## 2023-06-30 DIAGNOSIS — I071 Rheumatic tricuspid insufficiency: Secondary | ICD-10-CM | POA: Diagnosis present

## 2023-06-30 DIAGNOSIS — Z79899 Other long term (current) drug therapy: Secondary | ICD-10-CM

## 2023-06-30 DIAGNOSIS — Z560 Unemployment, unspecified: Secondary | ICD-10-CM

## 2023-06-30 DIAGNOSIS — I10 Essential (primary) hypertension: Secondary | ICD-10-CM | POA: Diagnosis not present

## 2023-06-30 DIAGNOSIS — K660 Peritoneal adhesions (postprocedural) (postinfection): Secondary | ICD-10-CM | POA: Diagnosis present

## 2023-06-30 DIAGNOSIS — Z01810 Encounter for preprocedural cardiovascular examination: Secondary | ICD-10-CM | POA: Diagnosis not present

## 2023-06-30 DIAGNOSIS — E1065 Type 1 diabetes mellitus with hyperglycemia: Secondary | ICD-10-CM | POA: Diagnosis present

## 2023-06-30 DIAGNOSIS — K819 Cholecystitis, unspecified: Secondary | ICD-10-CM | POA: Diagnosis not present

## 2023-06-30 DIAGNOSIS — E1022 Type 1 diabetes mellitus with diabetic chronic kidney disease: Secondary | ICD-10-CM | POA: Diagnosis present

## 2023-06-30 DIAGNOSIS — E875 Hyperkalemia: Secondary | ICD-10-CM | POA: Diagnosis present

## 2023-06-30 DIAGNOSIS — R112 Nausea with vomiting, unspecified: Secondary | ICD-10-CM

## 2023-06-30 DIAGNOSIS — Z7682 Awaiting organ transplant status: Secondary | ICD-10-CM | POA: Diagnosis not present

## 2023-06-30 DIAGNOSIS — N764 Abscess of vulva: Secondary | ICD-10-CM | POA: Diagnosis present

## 2023-06-30 DIAGNOSIS — Z5986 Financial insecurity: Secondary | ICD-10-CM

## 2023-06-30 DIAGNOSIS — E1159 Type 2 diabetes mellitus with other circulatory complications: Secondary | ICD-10-CM | POA: Diagnosis present

## 2023-06-30 DIAGNOSIS — I420 Dilated cardiomyopathy: Secondary | ICD-10-CM | POA: Diagnosis present

## 2023-06-30 DIAGNOSIS — Z1152 Encounter for screening for COVID-19: Secondary | ICD-10-CM

## 2023-06-30 DIAGNOSIS — Z881 Allergy status to other antibiotic agents status: Secondary | ICD-10-CM

## 2023-06-30 DIAGNOSIS — I132 Hypertensive heart and chronic kidney disease with heart failure and with stage 5 chronic kidney disease, or end stage renal disease: Secondary | ICD-10-CM | POA: Diagnosis present

## 2023-06-30 DIAGNOSIS — E1165 Type 2 diabetes mellitus with hyperglycemia: Secondary | ICD-10-CM | POA: Diagnosis not present

## 2023-06-30 DIAGNOSIS — Z794 Long term (current) use of insulin: Secondary | ICD-10-CM | POA: Diagnosis not present

## 2023-06-30 DIAGNOSIS — E785 Hyperlipidemia, unspecified: Secondary | ICD-10-CM | POA: Diagnosis present

## 2023-06-30 DIAGNOSIS — R9431 Abnormal electrocardiogram [ECG] [EKG]: Secondary | ICD-10-CM | POA: Diagnosis present

## 2023-06-30 DIAGNOSIS — E860 Dehydration: Secondary | ICD-10-CM | POA: Diagnosis present

## 2023-06-30 DIAGNOSIS — Z885 Allergy status to narcotic agent status: Secondary | ICD-10-CM

## 2023-06-30 DIAGNOSIS — F319 Bipolar disorder, unspecified: Secondary | ICD-10-CM | POA: Diagnosis present

## 2023-06-30 DIAGNOSIS — Z825 Family history of asthma and other chronic lower respiratory diseases: Secondary | ICD-10-CM

## 2023-06-30 DIAGNOSIS — I509 Heart failure, unspecified: Secondary | ICD-10-CM | POA: Diagnosis not present

## 2023-06-30 DIAGNOSIS — E10649 Type 1 diabetes mellitus with hypoglycemia without coma: Secondary | ICD-10-CM | POA: Diagnosis present

## 2023-06-30 DIAGNOSIS — Z833 Family history of diabetes mellitus: Secondary | ICD-10-CM

## 2023-06-30 DIAGNOSIS — I5043 Acute on chronic combined systolic (congestive) and diastolic (congestive) heart failure: Secondary | ICD-10-CM | POA: Diagnosis present

## 2023-06-30 DIAGNOSIS — Z880 Allergy status to penicillin: Secondary | ICD-10-CM

## 2023-06-30 DIAGNOSIS — I5042 Chronic combined systolic (congestive) and diastolic (congestive) heart failure: Secondary | ICD-10-CM | POA: Diagnosis present

## 2023-06-30 DIAGNOSIS — Z888 Allergy status to other drugs, medicaments and biological substances status: Secondary | ICD-10-CM

## 2023-06-30 DIAGNOSIS — I5021 Acute systolic (congestive) heart failure: Secondary | ICD-10-CM | POA: Diagnosis not present

## 2023-06-30 DIAGNOSIS — N926 Irregular menstruation, unspecified: Secondary | ICD-10-CM | POA: Diagnosis present

## 2023-06-30 DIAGNOSIS — N186 End stage renal disease: Principal | ICD-10-CM

## 2023-06-30 DIAGNOSIS — Z79890 Hormone replacement therapy (postmenopausal): Secondary | ICD-10-CM | POA: Diagnosis not present

## 2023-06-30 DIAGNOSIS — R739 Hyperglycemia, unspecified: Secondary | ICD-10-CM

## 2023-06-30 DIAGNOSIS — K810 Acute cholecystitis: Secondary | ICD-10-CM | POA: Diagnosis not present

## 2023-06-30 DIAGNOSIS — Z992 Dependence on renal dialysis: Secondary | ICD-10-CM | POA: Diagnosis not present

## 2023-06-30 DIAGNOSIS — E872 Acidosis, unspecified: Secondary | ICD-10-CM | POA: Diagnosis present

## 2023-06-30 DIAGNOSIS — D631 Anemia in chronic kidney disease: Secondary | ICD-10-CM | POA: Diagnosis present

## 2023-06-30 DIAGNOSIS — Z9641 Presence of insulin pump (external) (internal): Secondary | ICD-10-CM | POA: Diagnosis present

## 2023-06-30 DIAGNOSIS — Z91148 Patient's other noncompliance with medication regimen for other reason: Secondary | ICD-10-CM

## 2023-06-30 LAB — BASIC METABOLIC PANEL
Anion gap: 15 (ref 5–15)
Anion gap: 15 (ref 5–15)
Anion gap: 14 (ref 5–15)
Anion gap: 16 — ABNORMAL HIGH (ref 5–15)
BUN: 37 mg/dL — ABNORMAL HIGH (ref 6–20)
BUN: 37 mg/dL — ABNORMAL HIGH (ref 6–20)
BUN: 38 mg/dL — ABNORMAL HIGH (ref 6–20)
BUN: 40 mg/dL — ABNORMAL HIGH (ref 6–20)
CO2: 22 mmol/L (ref 22–32)
CO2: 21 mmol/L — ABNORMAL LOW (ref 22–32)
CO2: 23 mmol/L (ref 22–32)
CO2: 23 mmol/L (ref 22–32)
Calcium: 7.9 mg/dL — ABNORMAL LOW (ref 8.9–10.3)
Calcium: 7.5 mg/dL — ABNORMAL LOW (ref 8.9–10.3)
Calcium: 7.3 mg/dL — ABNORMAL LOW (ref 8.9–10.3)
Calcium: 7.5 mg/dL — ABNORMAL LOW (ref 8.9–10.3)
Chloride: 96 mmol/L — ABNORMAL LOW (ref 98–111)
Chloride: 97 mmol/L — ABNORMAL LOW (ref 98–111)
Chloride: 97 mmol/L — ABNORMAL LOW (ref 98–111)
Chloride: 97 mmol/L — ABNORMAL LOW (ref 98–111)
Creatinine, Ser: 8.1 mg/dL — ABNORMAL HIGH (ref 0.44–1.00)
Creatinine, Ser: 7.89 mg/dL — ABNORMAL HIGH (ref 0.44–1.00)
Creatinine, Ser: 8.16 mg/dL — ABNORMAL HIGH (ref 0.44–1.00)
Creatinine, Ser: 8.35 mg/dL — ABNORMAL HIGH (ref 0.44–1.00)
GFR, Estimated: 6 mL/min — ABNORMAL LOW (ref >60)
GFR, Estimated: 6 mL/min — ABNORMAL LOW (ref >60)
GFR, Estimated: 6 mL/min — ABNORMAL LOW (ref >60)
GFR, Estimated: 6 mL/min — ABNORMAL LOW (ref >60)
Glucose, Bld: 428 mg/dL — ABNORMAL HIGH (ref 70–99)
Glucose, Bld: 238 mg/dL — ABNORMAL HIGH (ref 70–99)
Glucose, Bld: 253 mg/dL — ABNORMAL HIGH (ref 70–99)
Glucose, Bld: 193 mg/dL — ABNORMAL HIGH (ref 70–99)
Potassium: 5.4 mmol/L — ABNORMAL HIGH (ref 3.5–5.1)
Potassium: 5.4 mmol/L — ABNORMAL HIGH (ref 3.5–5.1)
Potassium: 5.6 mmol/L — ABNORMAL HIGH (ref 3.5–5.1)
Potassium: 5.3 mmol/L — ABNORMAL HIGH (ref 3.5–5.1)
Sodium: 133 mmol/L — ABNORMAL LOW (ref 135–145)
Sodium: 133 mmol/L — ABNORMAL LOW (ref 135–145)
Sodium: 134 mmol/L — ABNORMAL LOW (ref 135–145)
Sodium: 136 mmol/L (ref 135–145)

## 2023-06-30 LAB — RESP PANEL BY RT-PCR (RSV, FLU A&B, COVID)  RVPGX2
Influenza A by PCR: NEGATIVE
Influenza B by PCR: NEGATIVE
Resp Syncytial Virus by PCR: NEGATIVE
SARS Coronavirus 2 by RT PCR: NEGATIVE

## 2023-06-30 LAB — CBC WITH DIFFERENTIAL/PLATELET
Abs Immature Granulocytes: 0.04 10*3/uL (ref 0.00–0.07)
Basophils Absolute: 0.1 10*3/uL (ref 0.0–0.1)
Basophils Relative: 1 %
Eosinophils Absolute: 0.2 10*3/uL (ref 0.0–0.5)
Eosinophils Relative: 2 %
HCT: 39.6 % (ref 36.0–46.0)
Hemoglobin: 11.9 g/dL — ABNORMAL LOW (ref 12.0–15.0)
Immature Granulocytes: 1 %
Lymphocytes Relative: 37 %
Lymphs Abs: 3.2 10*3/uL (ref 0.7–4.0)
MCH: 25.6 pg — ABNORMAL LOW (ref 26.0–34.0)
MCHC: 30.1 g/dL (ref 30.0–36.0)
MCV: 85.3 fL (ref 80.0–100.0)
Monocytes Absolute: 0.7 10*3/uL (ref 0.1–1.0)
Monocytes Relative: 8 %
Neutro Abs: 4.5 10*3/uL (ref 1.7–7.7)
Neutrophils Relative %: 51 %
Platelets: 191 10*3/uL (ref 150–400)
RBC: 4.64 MIL/uL (ref 3.87–5.11)
RDW: 15.4 % (ref 11.5–15.5)
WBC: 8.6 10*3/uL (ref 4.0–10.5)
nRBC: 0 % (ref 0.0–0.2)

## 2023-06-30 LAB — I-STAT VENOUS BLOOD GAS, ED
Acid-base deficit: 1 mmol/L (ref 0.0–2.0)
Bicarbonate: 23.2 mmol/L (ref 20.0–28.0)
Calcium, Ion: 0.9 mmol/L — ABNORMAL LOW (ref 1.15–1.40)
HCT: 41 % (ref 36.0–46.0)
Hemoglobin: 13.9 g/dL (ref 12.0–15.0)
O2 Saturation: 76 %
Potassium: 5.2 mmol/L — ABNORMAL HIGH (ref 3.5–5.1)
Sodium: 133 mmol/L — ABNORMAL LOW (ref 135–145)
TCO2: 24 mmol/L (ref 22–32)
pCO2, Ven: 36.3 mmHg — ABNORMAL LOW (ref 44–60)
pH, Ven: 7.414 (ref 7.25–7.43)
pO2, Ven: 40 mmHg (ref 32–45)

## 2023-06-30 LAB — GLUCOSE, CAPILLARY
Glucose-Capillary: 246 mg/dL — ABNORMAL HIGH (ref 70–99)
Glucose-Capillary: 126 mg/dL — ABNORMAL HIGH (ref 70–99)

## 2023-06-30 LAB — HEPATIC FUNCTION PANEL
ALT: 36 U/L (ref 0–44)
AST: 36 U/L (ref 15–41)
Albumin: 3.3 g/dL — ABNORMAL LOW (ref 3.5–5.0)
Alkaline Phosphatase: 220 U/L — ABNORMAL HIGH (ref 38–126)
Bilirubin, Direct: 0.2 mg/dL (ref 0.0–0.2)
Indirect Bilirubin: 0.9 mg/dL (ref 0.3–0.9)
Total Bilirubin: 1.1 mg/dL (ref 0.0–1.2)
Total Protein: 5.9 g/dL — ABNORMAL LOW (ref 6.5–8.1)

## 2023-06-30 LAB — I-STAT CG4 LACTIC ACID, ED
Lactic Acid, Venous: 2.8 mmol/L (ref 0.5–1.9)
Lactic Acid, Venous: 3.2 mmol/L (ref 0.5–1.9)

## 2023-06-30 LAB — BETA-HYDROXYBUTYRIC ACID
Beta-Hydroxybutyric Acid: 0.13 mmol/L (ref 0.05–0.27)
Beta-Hydroxybutyric Acid: 0.13 mmol/L (ref 0.05–0.27)
Beta-Hydroxybutyric Acid: 0.33 mmol/L — ABNORMAL HIGH (ref 0.05–0.27)
Beta-Hydroxybutyric Acid: 0.14 mmol/L (ref 0.05–0.27)

## 2023-06-30 LAB — OSMOLALITY: Osmolality: 314 mosm/kg — ABNORMAL HIGH (ref 275–295)

## 2023-06-30 LAB — LIPASE, BLOOD: Lipase: 22 U/L (ref 11–51)

## 2023-06-30 LAB — HCG, SERUM, QUALITATIVE: Preg, Serum: NEGATIVE

## 2023-06-30 LAB — HEPATITIS B SURFACE ANTIGEN: Hepatitis B Surface Ag: NONREACTIVE

## 2023-06-30 LAB — MRSA NEXT GEN BY PCR, NASAL: MRSA by PCR Next Gen: NOT DETECTED

## 2023-06-30 LAB — CBG MONITORING, ED
Glucose-Capillary: 378 mg/dL — ABNORMAL HIGH (ref 70–99)
Glucose-Capillary: 218 mg/dL — ABNORMAL HIGH (ref 70–99)

## 2023-06-30 MED ORDER — SODIUM CHLORIDE 0.9 % IV BOLUS
500.0000 mL | Freq: Once | INTRAVENOUS | Status: AC
  Administered 2023-06-30: 500 mL via INTRAVENOUS

## 2023-06-30 MED ORDER — HYDROXYZINE HCL 25 MG PO TABS
25.0000 mg | ORAL_TABLET | Freq: Once | ORAL | Status: AC
  Administered 2023-06-30: 25 mg via ORAL
  Filled 2023-06-30: qty 1

## 2023-06-30 MED ORDER — CARVEDILOL 25 MG PO TABS
25.0000 mg | ORAL_TABLET | Freq: Two times a day (BID) | ORAL | Status: DC
  Administered 2023-06-30 – 2023-07-08 (×14): 25 mg via ORAL
  Filled 2023-06-30 (×13): qty 1

## 2023-06-30 MED ORDER — FLUOXETINE HCL 20 MG PO CAPS
20.0000 mg | ORAL_CAPSULE | Freq: Every day | ORAL | Status: DC
  Administered 2023-06-30 – 2023-07-08 (×9): 20 mg via ORAL
  Filled 2023-06-30 (×9): qty 1

## 2023-06-30 MED ORDER — SODIUM CHLORIDE 0.9% FLUSH
3.0000 mL | Freq: Two times a day (BID) | INTRAVENOUS | Status: DC
  Administered 2023-06-30 – 2023-07-08 (×15): 3 mL via INTRAVENOUS

## 2023-06-30 MED ORDER — ALBUTEROL SULFATE (2.5 MG/3ML) 0.083% IN NEBU: 2.5000 mg | INHALATION_SOLUTION | Freq: Four times a day (QID) | RESPIRATORY_TRACT | Status: DC | PRN

## 2023-06-30 MED ORDER — TRIMETHOBENZAMIDE HCL 100 MG/ML IM SOLN
200.0000 mg | Freq: Four times a day (QID) | INTRAMUSCULAR | Status: DC | PRN
  Administered 2023-07-03: 200 mg via INTRAMUSCULAR
  Filled 2023-06-30 (×2): qty 2

## 2023-06-30 MED ORDER — SODIUM ZIRCONIUM CYCLOSILICATE 10 G PO PACK
10.0000 g | PACK | Freq: Once | ORAL | Status: AC
  Administered 2023-06-30: 10 g via ORAL
  Filled 2023-06-30: qty 1

## 2023-06-30 MED ORDER — SODIUM ZIRCONIUM CYCLOSILICATE 10 G PO PACK
10.0000 g | PACK | Freq: Three times a day (TID) | ORAL | Status: DC
  Administered 2023-06-30 – 2023-07-02 (×6): 10 g via ORAL
  Filled 2023-06-30 (×6): qty 1

## 2023-06-30 MED ORDER — CHLORHEXIDINE GLUCONATE CLOTH 2 % EX PADS
6.0000 | MEDICATED_PAD | Freq: Every day | CUTANEOUS | Status: DC
  Administered 2023-07-01 – 2023-07-08 (×8): 6 via TOPICAL

## 2023-06-30 MED ORDER — INSULIN ASPART 100 UNIT/ML IJ SOLN
4.0000 [IU] | Freq: Once | INTRAMUSCULAR | Status: AC
  Administered 2023-06-30: 4 [IU] via INTRAVENOUS

## 2023-06-30 MED ORDER — ONDANSETRON HCL 4 MG/2ML IJ SOLN
4.0000 mg | Freq: Once | INTRAMUSCULAR | Status: AC
  Administered 2023-06-30: 4 mg via INTRAVENOUS
  Filled 2023-06-30: qty 2

## 2023-06-30 MED ORDER — HYDRALAZINE HCL 20 MG/ML IJ SOLN: 10.0000 mg | INTRAMUSCULAR | Status: DC | PRN

## 2023-06-30 MED ORDER — INSULIN GLARGINE 100 UNIT/ML ~~LOC~~ SOLN
8.0000 [IU] | SUBCUTANEOUS | Status: DC
  Administered 2023-06-30: 8 [IU] via SUBCUTANEOUS
  Filled 2023-06-30 (×2): qty 0.08

## 2023-06-30 MED ORDER — HEPARIN SODIUM (PORCINE) 5000 UNIT/ML IJ SOLN
5000.0000 [IU] | Freq: Three times a day (TID) | INTRAMUSCULAR | Status: DC
  Administered 2023-06-30 – 2023-07-08 (×14): 5000 [IU] via SUBCUTANEOUS
  Filled 2023-06-30 (×18): qty 1

## 2023-06-30 MED ORDER — FENTANYL CITRATE PF 50 MCG/ML IJ SOSY
25.0000 ug | PREFILLED_SYRINGE | INTRAMUSCULAR | Status: DC | PRN
  Administered 2023-06-30 – 2023-07-05 (×11): 25 ug via INTRAVENOUS
  Filled 2023-06-30 (×11): qty 1

## 2023-06-30 MED ORDER — SODIUM CHLORIDE 0.9 % IV SOLN
2.0000 g | Freq: Once | INTRAVENOUS | Status: AC
  Administered 2023-06-30: 2 g via INTRAVENOUS
  Filled 2023-06-30: qty 20

## 2023-06-30 MED ORDER — METRONIDAZOLE 500 MG/100ML IV SOLN
500.0000 mg | Freq: Two times a day (BID) | INTRAVENOUS | Status: DC
  Administered 2023-06-30 – 2023-07-01 (×2): 500 mg via INTRAVENOUS
  Filled 2023-06-30 (×2): qty 100

## 2023-06-30 MED ORDER — IPRATROPIUM-ALBUTEROL 0.5-2.5 (3) MG/3ML IN SOLN
3.0000 mL | Freq: Once | RESPIRATORY_TRACT | Status: AC
  Administered 2023-06-30: 3 mL via RESPIRATORY_TRACT
  Filled 2023-06-30: qty 3

## 2023-06-30 MED ORDER — SODIUM CHLORIDE 0.9 % IV SOLN
2.0000 g | INTRAVENOUS | Status: DC
  Administered 2023-07-01 – 2023-07-02 (×2): 2 g via INTRAVENOUS
  Filled 2023-06-30 (×2): qty 20

## 2023-06-30 MED ORDER — SUMATRIPTAN SUCCINATE 50 MG PO TABS: 50.0000 mg | ORAL_TABLET | ORAL | Status: DC | PRN

## 2023-06-30 MED ORDER — HYDROXYZINE HCL 25 MG PO TABS
25.0000 mg | ORAL_TABLET | Freq: Three times a day (TID) | ORAL | Status: DC | PRN
  Administered 2023-07-02 – 2023-07-04 (×4): 25 mg via ORAL
  Filled 2023-06-30 (×4): qty 1

## 2023-06-30 MED ORDER — ACETAMINOPHEN 325 MG PO TABS: 650.0000 mg | ORAL_TABLET | Freq: Four times a day (QID) | ORAL | Status: DC | PRN

## 2023-06-30 MED ORDER — LOSARTAN POTASSIUM 50 MG PO TABS
100.0000 mg | ORAL_TABLET | Freq: Every day | ORAL | Status: DC
  Administered 2023-06-30 – 2023-07-08 (×8): 100 mg via ORAL
  Filled 2023-06-30 (×8): qty 2

## 2023-06-30 MED ORDER — INSULIN ASPART 100 UNIT/ML IJ SOLN
0.0000 [IU] | Freq: Three times a day (TID) | INTRAMUSCULAR | Status: DC
  Administered 2023-06-30: 3 [IU] via SUBCUTANEOUS

## 2023-06-30 MED ORDER — FENTANYL CITRATE PF 50 MCG/ML IJ SOSY
50.0000 ug | PREFILLED_SYRINGE | Freq: Once | INTRAMUSCULAR | Status: AC
  Administered 2023-06-30: 50 ug via INTRAVENOUS
  Filled 2023-06-30: qty 1

## 2023-06-30 MED ORDER — ACETAMINOPHEN 650 MG RE SUPP: 650.0000 mg | Freq: Four times a day (QID) | RECTAL | Status: DC | PRN

## 2023-06-30 MED ORDER — SEVELAMER CARBONATE 800 MG PO TABS
2400.0000 mg | ORAL_TABLET | Freq: Three times a day (TID) | ORAL | Status: DC
  Administered 2023-07-01 – 2023-07-08 (×20): 2400 mg via ORAL
  Filled 2023-06-30 (×20): qty 3

## 2023-06-30 NOTE — ED Provider Notes (Signed)
 [place] EMERGENCY DEPARTMENT AT [facility] Provider Note   CSN: [id number] Arrival date & time: 06/30/23  4098     History  Chief Complaint  Patient presents with   Hyperglycemia    Ashley Freeman is a 33 y.o. female.  Pt is a 33 yo female with pmhx significant for DM1, ESRD on HD (Tu, Th, Sat), hld, bipolar d/o, chf and nonischemic CM.  Pt has been sick for several days.  She was seen here on 2/23 for similar sx.  She was dx'd with enteritis and was feeling better and was d/c home.  She's continued to feel poorly.  Last dialysis was on 2/27.  She felt too bad to go yesterday.  She hurts all over.  She has a cough.  She's had n/v.  BS is elevated.  She did not take her insulin this am.       Home Medications Prior to Admission medications   Medication Sig Start Date End Date Taking? Authorizing Provider  acetaminophen (TYLENOL) 500 MG tablet Take 1,000 mg by mouth every 6 (six) hours as needed for mild pain or moderate pain.    [provider]  albuterol (VENTOLIN HFA) 108 (90 Base) MCG/ACT inhaler Inhale 2 puffs into the lungs every 4 (four) hours as needed. 11/24/22   [provider]  amLODipine (NORVASC) 10 MG tablet Take 1 tablet (10 mg total) by mouth daily. 11/11/21 06/06/25  Lorin Glass, MD  BAQSIMI TWO PACK 3 MG/DOSE POWD Place 1 spray into both nostrils daily. 05/14/23   [provider]  blood glucose meter kit and supplies KIT Dispense based on patient and insurance preference. Use up to four times daily as directed. (FOR ICD-9 250.00, 250.01). 05/25/17   Jacalyn Lefevre, MD  Blood Pressure Monitoring (BLOOD PRESSURE KIT) DEVI 1 Device by Does not apply route as needed. 05/06/19   Anyanwu, Jethro Bastos, MD  carvedilol (COREG) 25 MG tablet Take 25 mg by mouth 2 (two) times daily with a meal. 05/15/23   [provider]  Continuous Blood Gluc Sensor (DEXCOM G6 SENSOR) MISC Change sensors every 10 days 07/02/22   Shamleffer,  Konrad Dolores, MD  Continuous Blood Gluc Transmit (DEXCOM G6 TRANSMITTER) MISC Inject 1 Device into the skin as directed. Use to check blood sugar daily 07/02/22   Shamleffer, Konrad Dolores, MD  Continuous Glucose Sensor (DEXCOM G7 SENSOR) MISC Change sensors every 10 days 09/07/22   Shamleffer, Konrad Dolores, MD  FLUoxetine (PROZAC) 20 MG capsule Take 1 capsule by mouth daily. 03/23/23   [provider]  hydrOXYzine (ATARAX) 25 MG tablet Take 25 mg by mouth every 4 (four) hours as needed. 05/14/23   [provider]  Insulin Aspart FlexPen (NOVOLOG) 100 UNIT/ML Inject 4 Units into the skin 3 (three) times daily. 05/10/23 06/23/23  [provider]  lamoTRIgine (LAMICTAL) 200 MG tablet Take 200 mg by mouth daily.    [provider]  LANTUS SOLOSTAR 100 UNIT/ML Solostar Pen SMARTSIG:8 Unit(s) SUB-Q Daily    [provider]  LORazepam (ATIVAN) 1 MG tablet Take 1 mg by mouth daily as needed.    [provider]  losartan (COZAAR) 100 MG tablet Take 1 tablet by mouth daily. 03/25/23   [provider]  ondansetron (ZOFRAN-ODT) 4 MG disintegrating tablet Take 1 tablet (4 mg total) by mouth every 8 (eight) hours as needed for nausea or vomiting. 06/23/23   Laurence Spates, MD  oxyCODONE-acetaminophen (PERCOCET/ROXICET) 5-325 MG tablet Take  1 tablet by mouth every 4 (four) hours as needed for severe pain. 07/10/22   Rhyne, Ames Coupe, PA-C  permethrin (ELIMITE) 5 % cream Apply topically. 05/14/23   [provider]  polyethylene glycol (MIRALAX / GLYCOLAX) 17 g packet Take 17 g by mouth daily as needed. 12/07/22   [provider]  rosuvastatin (CRESTOR) 40 MG tablet Take 40 mg by mouth daily. 05/22/22   [provider]  sevelamer carbonate (RENVELA) 800 MG tablet Take 2,400 mg by mouth 3 (three) times daily with meals.    [provider]  SUMAtriptan (IMITREX) 50 MG tablet Take 50 mg by mouth every 2 (two) hours as  needed for migraine or headache. 06/20/22   [provider]      Allergies    Cephalexin, Morphine, Penicillins, Benadryl [diphenhydramine], and Doxycycline    Review of Systems   Review of Systems  Respiratory:  Positive for cough.   Gastrointestinal:  Positive for nausea and vomiting.  Musculoskeletal:  Positive for myalgias.  All other systems reviewed and are negative.   Physical Exam Updated Vital Signs BP (!) 137/97   Pulse 75   Temp 97.7 F (36.5 C) (Oral)   Resp 18   Ht 5\' 3"  (1.6 m)   Wt 60.3 kg   LMP  (LMP Unknown)   SpO2 96%   BMI 23.56 kg/m  Physical Exam Vitals and nursing note reviewed.  Constitutional:      Appearance: Normal appearance. She is ill-appearing.  HENT:     Head: Normocephalic and atraumatic.     Right Ear: External ear normal.     Left Ear: External ear normal.     Nose: Nose normal.     Mouth/Throat:     Mouth: Mucous membranes are dry.  Eyes:     Extraocular Movements: Extraocular movements intact.     Conjunctiva/sclera: Conjunctivae normal.     Pupils: Pupils are equal, round, and reactive to light.  Cardiovascular:     Rate and Rhythm: Regular rhythm. Tachycardia present.     Pulses: Normal pulses.     Heart sounds: Normal heart sounds.  Pulmonary:     Effort: Pulmonary effort is normal.     Breath sounds: Normal breath sounds.  Abdominal:     General: Abdomen is flat. Bowel sounds are normal.     Palpations: Abdomen is soft.     Tenderness: There is abdominal tenderness in the right upper quadrant.  Musculoskeletal:        General: Normal range of motion.     Cervical back: Normal range of motion and neck supple.     Comments: RUE AVF with + thrill  Skin:    General: Skin is warm.     Capillary Refill: Capillary refill takes less than 2 seconds.  Neurological:     General: No focal deficit present.     Mental Status: She is alert and oriented to person, place, and time.  Psychiatric:        Mood and Affect:  Mood normal.        Behavior: Behavior normal.     ED Results / Procedures / Treatments   Labs (all labs ordered are listed, but only abnormal results are displayed) Labs Reviewed  BASIC METABOLIC PANEL - Abnormal; Notable for the following components:      Result Value   Sodium 133 (*)    Potassium 5.4 (*)    Chloride 96 (*)    Glucose, Bld 428 (*)  BUN 37 (*)    Creatinine, Ser 8.10 (*)    Calcium 7.9 (*)    GFR, Estimated 6 (*)    All other components within normal limits  CBC WITH DIFFERENTIAL/PLATELET - Abnormal; Notable for the following components:   Hemoglobin 11.9 (*)    MCH 25.6 (*)    All other components within normal limits  OSMOLALITY - Abnormal; Notable for the following components:   Osmolality 314 (*)    All other components within normal limits  HEPATIC FUNCTION PANEL - Abnormal; Notable for the following components:   Total Protein 5.9 (*)    Albumin 3.3 (*)    Alkaline Phosphatase 220 (*)    All other components within normal limits  CBG MONITORING, ED - Abnormal; Notable for the following components:   Glucose-Capillary 378 (*)    All other components within normal limits  I-STAT VENOUS BLOOD GAS, ED - Abnormal; Notable for the following components:   pCO2, Ven 36.3 (*)    Sodium 133 (*)    Potassium 5.2 (*)    Calcium, Ion 0.90 (*)    All other components within normal limits  I-STAT CG4 LACTIC ACID, ED - Abnormal; Notable for the following components:   Lactic Acid, Venous 2.8 (*)    All other components within normal limits  I-STAT CG4 LACTIC ACID, ED - Abnormal; Notable for the following components:   Lactic Acid, Venous 3.2 (*)    All other components within normal limits  CBG MONITORING, ED - Abnormal; Notable for the following components:   Glucose-Capillary 218 (*)    All other components within normal limits  RESP PANEL BY RT-PCR (RSV, FLU A&B, COVID)  RVPGX2  HCG, SERUM, QUALITATIVE  BETA-HYDROXYBUTYRIC ACID  LIPASE, BLOOD  BASIC  METABOLIC PANEL  BASIC METABOLIC PANEL  BASIC METABOLIC PANEL  BETA-HYDROXYBUTYRIC ACID  BETA-HYDROXYBUTYRIC ACID  BETA-HYDROXYBUTYRIC ACID  URINALYSIS, ROUTINE W REFLEX MICROSCOPIC  BASIC METABOLIC PANEL  BETA-HYDROXYBUTYRIC ACID    EKG EKG Interpretation Date/Time:  Sunday June 30 2023 07:58:27 EST Ventricular Rate:  75 PR Interval:  169 QRS Duration:  86 QT Interval:  452 QTC Calculation: 505 R Axis:   -4  Text Interpretation: Sinus rhythm Probable left atrial enlargement Consider anterior infarct Prolonged QT interval No significant change since last tracing Confirmed by Jacalyn Lefevre [phone]) on 06/30/2023 8:34:04 AM  Radiology US Abdomen Limited RUQ (LIVER/GB) Result Date: 06/30/2023 CLINICAL DATA:  Right upper quadrant pain EXAM: ULTRASOUND ABDOMEN LIMITED RIGHT UPPER QUADRANT COMPARISON:  06/23/2023 FINDINGS: Gallbladder: Small volume material in the dependent gallbladder without shadowing, high-density by prior CT. There is diffuse wall thickening to 6 mm but no focal tenderness or pericholecystic edema. Common bile duct: Diameter: 5 mm Liver: No focal lesion identified. Within normal limits in parenchymal echogenicity. Portal vein is patent on color Doppler imaging with normal direction of blood flow towards the liver. Other: Small volume perihepatic ascites also seen on prior study. IMPRESSION: Cholelithiasis (better seen on prior CT) and distended, thick walled gallbladder but no sonographic Murphy sign typical of acute cholecystitis. Electronically Signed   By: Tiburcio Pea M.D.   On: 06/30/2023 12:14   DG Chest Portable 1 View Result Date: 06/30/2023 CLINICAL DATA:  Shortness of breath. EXAM: PORTABLE CHEST 1 VIEW COMPARISON:  06/23/2023 FINDINGS: Heart size is normal. Lung volumes are low. No pleural fluid, interstitial edema or airspace disease. Visualized osseous structures appear intact. IMPRESSION: Low lung volumes. No acute findings. Electronically Signed   By: Ladona Ridgel  Bradly Chris M.D.   On: 06/30/2023 09:02    Procedures Procedures    Medications Ordered in ED Medications  ondansetron (ZOFRAN) injection 4 mg (4 mg Intravenous Given 06/30/23 0833)  sodium chloride 0.9 % bolus 500 mL (0 mLs Intravenous Stopped 06/30/23 0925)  insulin aspart (novoLOG) injection 4 Units (4 Units Intravenous Given 06/30/23 0916)  fentaNYL (SUBLIMAZE) injection 50 mcg (50 mcg Intravenous Given 06/30/23 0915)  hydrOXYzine (ATARAX) tablet 25 mg (25 mg Oral Given 06/30/23 0925)  sodium zirconium cyclosilicate (LOKELMA) packet 10 g (10 g Oral Given 06/30/23 0926)  ipratropium-albuterol (DUONEB) 0.5-2.5 (3) MG/3ML nebulizer solution 3 mL (3 mLs Nebulization Given 06/30/23 0928)  sodium chloride 0.9 % bolus 500 mL (500 mLs Intravenous New Bag/Given 06/30/23 1254)  cefTRIAXone (ROCEPHIN) 2 g in sodium chloride 0.9 % 100 mL IVPB (2 g Intravenous New Bag/Given 06/30/23 1255)    ED Course/ Medical Decision Making/ A&P                                 Medical Decision Making Amount and/or Complexity of Data Reviewed Labs: ordered. Radiology: ordered.  Risk Prescription drug management. Decision regarding hospitalization.   This patient presents to the ED for concern of n/v and hyperglycemia, this involves an extensive number of treatment options, and is a complaint that carries with it a high risk of complications and morbidity.  The differential diagnosis includes dka, hhs, infection, electrolyte abn, anemia, pregnancy   Co morbidities that complicate the patient evaluation  DM1, ESRD on HD (Tu, Th, Sat), hld, bipolar d/o, chf and nonischemic CM   Additional history obtained:  Additional history obtained from epic chart review External records from outside source obtained and reviewed including EMS report   Lab Tests:  I Ordered, and personally interpreted labs.  The pertinent results include:  vbg nl, cbc nl other than mild, chronic anemia with hgb 11.9, lactic elevated at 2.8; bmp  with k elevated at 5.4, glucose 428, bun 37 and cr 8.1, preg neg, BHB 0.13   Imaging Studies ordered:  I ordered imaging studies including cxr  I independently visualized and interpreted imaging which showed Low lung volumes. No acute findings.  I agree with the radiologist interpretation   Cardiac Monitoring:  The patient was maintained on a cardiac monitor.  I personally viewed and interpreted the cardiac monitored which showed an underlying rhythm of: nsr   Medicines ordered and prescription drug management:  I ordered medication including ivfs/zofran  for sx  Reevaluation of the patient after these medicines showed that the patient improved I have reviewed the patients home medicines and have made adjustments as needed   Test Considered:  Gb US; CT abd done on 2/23   Critical Interventions:  ivfs   Consultations Obtained:  I requested consultation with the nephrologist (Dr. Thedore Mins),  and discussed lab and imaging findings as well as pertinent plan - he will likely plan for dialysis tomorrow Pt d/w Dr. Hillery Hunter (surgery) via OR nurse.  He will see pt in consult. Pt d/w Dr. Arlyss Queen ([facility]) for admission   Problem List / ED Course:  ESRD on HD with hyperkalemia:  lokelma given.  Iv insulin given.  Dialysis likely tomorrow. Hyperglycemia:  no dka.  Bs coming down with insulin and fluids. N/v:  possibly from cholecystitis.  IV rocephin given.  Surgery to consult.  Pt only able to do 1 or 2 sips of water.  She is a dialysis patient who has missed dialysis, so she's given gentle hydration.   Reevaluation:  After the interventions noted above, I reevaluated the patient and found that they have :improved   Social Determinants of Health:  Lives at home   Dispostion:  After consideration of the diagnostic results and the patients response to treatment, I feel that the patent would benefit from admission.    CRITICAL CARE Performed by: Jacalyn Lefevre   Total  critical care time: 30 minutes  Critical care time was exclusive of separately billable procedures and treating other patients.  Critical care was necessary to treat or prevent imminent or life-threatening deterioration.  Critical care was time spent personally by me on the following activities: development of treatment plan with patient and/or surrogate as well as nursing, discussions with consultants, evaluation of patient's response to treatment, examination of patient, obtaining history from patient or surrogate, ordering and performing treatments and interventions, ordering and review of laboratory studies, ordering and review of radiographic studies, pulse oximetry and re-evaluation of patient's condition.         Final Clinical Impression(s) / ED Diagnoses Final diagnoses:  ESRD on hemodialysis (HCC)  Dehydration  Hyperkalemia  Nausea and vomiting, unspecified vomiting type  Hyperglycemia    Rx / DC Orders ED Discharge Orders     None         Jacalyn Lefevre, MD 06/30/23 1355

## 2023-06-30 NOTE — Consult Note (Addendum)
 Ashley Freeman [birth year]  [id number].    Requesting MD: Katrinka Blazing Chief Complaint/Reason for Consult: ?Cholecystitis  HPI:  33 y/o F w/ a hx of DM1, ESRD on iHD, HLD, Bipolar Disorder, CHF, and non-ischemic cardiomyopathy who presented to the ED with several days of general malaise. She was treated for enteritis on 2/23 but continued to feel unwell and missed her scheduled iHD appointment on 2/27.  In the ED her labs were notable for a Cr 8.1, K 5.4, Alk phos 220, Tbili 1.1. RUQ US showed cholelithiasis w/ thick walled GB.  Surgery has been consulted to evaluate.  On exam, patient is somewhat lethargic but will awake to questioning.  She reports several days of upper abdominal pain, emesis, and general malaise. She denies diarrhea.  She is AF and HDS.   Abdominal surgical history is notable for PD dialysis (stopped because it was difficult to do with a young child at home)  ROS: Review of Systems  Constitutional:  Positive for malaise/fatigue.  HENT: Negative.    Eyes: Negative.   Respiratory: Negative.    Cardiovascular: Negative.   Gastrointestinal:  Positive for abdominal pain and vomiting.  Genitourinary: Negative.   Musculoskeletal: Negative.   Skin: Negative.   Neurological: Negative.   Endo/Heme/Allergies: Negative.   Psychiatric/Behavioral: Negative.      Family History  Problem Relation Age of Onset   Asthma Mother    Carpal tunnel syndrome Mother    Gout Father    Diabetes Paternal Grandmother    Anesthesia problems Neg Hx     Past Medical History:  Diagnosis Date   Abscess, gluteal, right 08/24/2013   Anemia 02/19/2012   Bartholin's gland abscess 09/19/2013   Bipolar disorder (HCC)    BV (bacterial vaginosis) 11/24/2015   Depression    Diabetes mellitus type I (HCC) 2001   Diagnosed at age 64 ; Type I   Diarrhea 05/30/2016   DKA (diabetic ketoacidoses) 08/19/2013   Also in 2018   ESRD (end stage renal disease) (HCC)    Gonorrhea 08/2011    Treated in 09/2011   HFrEF (heart failure with reduced ejection fraction) (HCC)    a. 2022 Echo: EF 40%; b. 10/2021 Echo: EF 55%; b. 07/2022 MV: No ischemia. EF 31%; c. 08/2022 Echo: EF 35%, mildly dil RV, sev TR.   History of trichomoniasis 05/31/2016   Hyperlipidemia 03/28/2016   Hypertension    NICM (nonischemic cardiomyopathy) (HCC)    Sepsis (HCC) 09/19/2013    Past Surgical History:  Procedure Laterality Date   A/V FISTULAGRAM Right 06/17/2023   Procedure: A/V Fistulagram;  Surgeon: Annice Needy, MD;  Location: [facility] INVASIVE CV LAB;  Service: Cardiovascular;  Laterality: Right;   AV FISTULA PLACEMENT Right 07/06/2022   Procedure: ARTERIOVENOUS GRAFT CREATION;  Surgeon: Cephus Shelling, MD;  Location: [facility] OR;  Service: Vascular;  Laterality: Right;   CESAREAN SECTION N/A 10/05/2019   Procedure: CESAREAN SECTION;  Surgeon: Rollingwood Bing, MD;  Location: [facility] LD ORS;  Service: Obstetrics;  Laterality: N/A;   INCISION AND DRAINAGE ABSCESS Left 09/28/2019   Procedure: INCISION AND DRAINAGE VULVAR ABCESS;  Surgeon: Tilda Burrow, MD;  Location: [facility] OR;  Service: Gynecology;  Laterality: Left;   INCISION AND DRAINAGE PERIRECTAL ABSCESS Right 08/18/2013   Procedure: IRRIGATION AND DEBRIDEMENT GLUTEAL ABSCESS;  Surgeon: Axel Filler, MD;  Location: [facility] OR;  Service: General;  Laterality: Right;   INCISION AND DRAINAGE PERIRECTAL ABSCESS Right 09/19/2013   Procedure: IRRIGATION AND DEBRIDEMENT RIGHT  GLUTEAL AND LABIAL ABSCESSES;  Surgeon: Axel Filler, MD;  Location: [facility] OR;  Service: General;  Laterality: Right;   INCISION AND DRAINAGE PERIRECTAL ABSCESS Right 09/24/2013   Procedure: IRRIGATION AND DEBRIDEMENT PERIRECTAL ABSCESS;  Surgeon: Cherylynn Ridges, MD;  Location: [facility] OR;  Service: General;  Laterality: Right;    Social History:  reports that she has never smoked. She has never been exposed to tobacco smoke. She has never used smokeless tobacco. She reports that she does not currently  use alcohol. She reports that she does not use drugs.  Allergies:  Allergies  Allergen Reactions   Cephalexin Anaphylaxis    Has gotten ceftriaxone in the past    Morphine Itching   Penicillins Hives and Rash    Has patient had a PCN reaction causing immediate rash, facial/tongue/throat swelling, SOB or lightheadedness with hypotension: Yes Has patient had a PCN reaction causing severe rash involving mucus membranes or skin necrosis: No Has patient had a PCN reaction that required hospitalization: Yes Has patient had a PCN reaction occurring within the last 10 years: No Spoke with pt - childhood hives told by mom, tried no pcns since, doesn't remember reaction herself    Benadryl [Diphenhydramine] Itching   Doxycycline Itching    (Not in a hospital admission)   Physical Exam: Blood pressure (!) 132/92, pulse 69, temperature 97.7 F (36.5 C), temperature source Oral, resp. rate 19, height 5\' 3"  (1.6 m), weight 60.3 kg, SpO2 99%, unknown if currently breastfeeding. Gen: female, somewhat lethargic Abd: soft, non-distended, multiple well healed scars, moderately TTP throughout the epigastric region, + guarding, no rebounding, no peritoneal signs  Results for orders placed or performed during the hospital encounter of 06/30/23 (from the past 48 hours)  Resp panel by RT-PCR (RSV, Flu A&B, Covid) Anterior Nasal Swab     Status: None   Collection Time: 06/30/23  7:56 AM   Specimen: Anterior Nasal Swab  Result Value Ref Range   SARS Coronavirus 2 by RT PCR NEGATIVE NEGATIVE   Influenza A by PCR NEGATIVE NEGATIVE   Influenza B by PCR NEGATIVE NEGATIVE    Comment: (NOTE) The Xpert Xpress SARS-CoV-2/FLU/RSV plus assay is intended as an aid in the diagnosis of influenza from Nasopharyngeal swab specimens and should not be used as a sole basis for treatment. Nasal washings and aspirates are unacceptable for Xpert Xpress SARS-CoV-2/FLU/RSV testing.  Fact Sheet for  Patients: BloggerCourse.com  Fact Sheet for Healthcare Providers: SeriousBroker.it  This test is not yet approved or cleared by the [place] FDA and has been authorized for detection and/or diagnosis of SARS-CoV-2 by FDA under an Emergency Use Authorization (EUA). This EUA will remain in effect (meaning this test can be used) for the duration of the COVID-19 declaration under Section 564(b)(1) of the Act, 21 U.S.C. section 360bbb-3(b)(1), unless the authorization is terminated or revoked.     Resp Syncytial Virus by PCR NEGATIVE NEGATIVE    Comment: (NOTE) Fact Sheet for Patients: BloggerCourse.com  Fact Sheet for Healthcare Providers: SeriousBroker.it  This test is not yet approved or cleared by the [place] FDA and has been authorized for detection and/or diagnosis of SARS-CoV-2 by FDA under an Emergency Use Authorization (EUA). This EUA will remain in effect (meaning this test can be used) for the duration of the COVID-19 declaration under Section 564(b)(1) of the Act, 21 U.S.C. section 360bbb-3(b)(1), unless the authorization is terminated or revoked.  Performed at [facility], [address], [place], [place] [address]  hCG, serum, qualitative     Status: None   Collection Time: 06/30/23  8:15 AM  Result Value Ref Range   Preg, Serum NEGATIVE NEGATIVE    Comment:        THE SENSITIVITY OF THIS METHODOLOGY IS >10 mIU/mL. Performed at [facility], [address], [place], [place] [address]   Basic metabolic panel     Status: Abnormal   Collection Time: 06/30/23  8:15 AM  Result Value Ref Range   Sodium 133 (L) 135 - 145 mmol/L   Potassium 5.4 (H) 3.5 - 5.1 mmol/L   Chloride 96 (L) 98 - 111 mmol/L   CO2 22 22 - 32 mmol/L   Glucose, Bld 428 (H) 70 - 99 mg/dL    Comment: Glucose reference range applies only to samples taken after fasting for at  least 8 hours.   BUN 37 (H) 6 - 20 mg/dL   Creatinine, Ser 0.86 (H) 0.44 - 1.00 mg/dL   Calcium 7.9 (L) 8.9 - 10.3 mg/dL   GFR, Estimated 6 (L) >60 mL/min    Comment: (NOTE) Calculated using the CKD-EPI Creatinine Equation (2021)    Anion gap 15 5 - 15    Comment: Performed at [facility], [address], [place], [place] [address]  Beta-hydroxybutyric acid     Status: None   Collection Time: 06/30/23  8:15 AM  Result Value Ref Range   Beta-Hydroxybutyric Acid 0.13 0.05 - 0.27 mmol/L    Comment: Performed at [facility], [address] [place], [place] [address]  CBC with Differential (PNL)     Status: Abnormal   Collection Time: 06/30/23  8:15 AM  Result Value Ref Range   WBC 8.6 4.0 - 10.5 K/uL   RBC 4.64 3.87 - 5.11 MIL/uL   Hemoglobin 11.9 (L) 12.0 - 15.0 g/dL   HCT 28.4 13.2 - 44.0 %   MCV 85.3 80.0 - 100.0 fL   MCH 25.6 (L) 26.0 - 34.0 pg   MCHC 30.1 30.0 - 36.0 g/dL   RDW 10.2 72.5 - 36.6 %   Platelets 191 150 - 400 K/uL   nRBC 0.0 0.0 - 0.2 %   Neutrophils Relative % 51 %   Neutro Abs 4.5 1.7 - 7.7 K/uL   Lymphocytes Relative 37 %   Lymphs Abs 3.2 0.7 - 4.0 K/uL   Monocytes Relative 8 %   Monocytes Absolute 0.7 0.1 - 1.0 K/uL   Eosinophils Relative 2 %   Eosinophils Absolute 0.2 0.0 - 0.5 K/uL   Basophils Relative 1 %   Basophils Absolute 0.1 0.0 - 0.1 K/uL   Immature Granulocytes 1 %   Abs Immature Granulocytes 0.04 0.00 - 0.07 K/uL    Comment: Performed at [facility], [address] [place], [place] [address]  Osmolality     Status: Abnormal   Collection Time: 06/30/23  8:15 AM  Result Value Ref Range   Osmolality 314 (H) 275 - 295 mOsm/kg    Comment: REPEATED TO VERIFY Performed at [facility], [address] [place], [place] [address]   Hepatic function panel     Status: Abnormal   Collection Time: 06/30/23  8:15 AM  Result Value Ref Range   Total Protein 5.9 (L) 6.5 - 8.1 g/dL   Albumin 3.3 (L) 3.5 - 5.0 g/dL   AST 36  15 - 41 U/L   ALT 36 0 - 44 U/L   Alkaline Phosphatase  220 (H) 38 - 126 U/L   Total Bilirubin 1.1 0.0 - 1.2 mg/dL   Bilirubin, Direct 0.2 0.0 - 0.2 mg/dL   Indirect Bilirubin 0.9 0.3 - 0.9 mg/dL    Comment: Performed at [facility], [address], [place], [place] [address]  Lipase, blood     Status: None   Collection Time: 06/30/23  8:15 AM  Result Value Ref Range   Lipase 22 11 - 51 U/L    Comment: Performed at [facility], [address] [place], [place] [address]  I-Stat Venous Blood Gas, ED     Status: Abnormal   Collection Time: 06/30/23  8:26 AM  Result Value Ref Range   pH, Ven 7.414 7.25 - 7.43   pCO2, Ven 36.3 (L) 44 - 60 mmHg   pO2, Ven 40 32 - 45 mmHg   Bicarbonate 23.2 20.0 - 28.0 mmol/L   TCO2 24 22 - 32 mmol/L   O2 Saturation 76 %   Acid-base deficit 1.0 0.0 - 2.0 mmol/L   Sodium 133 (L) 135 - 145 mmol/L   Potassium 5.2 (H) 3.5 - 5.1 mmol/L   Calcium, Ion 0.90 (L) 1.15 - 1.40 mmol/L   HCT 41.0 36.0 - 46.0 %   Hemoglobin 13.9 12.0 - 15.0 g/dL   Sample type VENOUS   I-Stat Lactic Acid, ED     Status: Abnormal   Collection Time: 06/30/23  8:27 AM  Result Value Ref Range   Lactic Acid, Venous 2.8 (HH) 0.5 - 1.9 mmol/L   Comment NOTIFIED PHYSICIAN   CBG monitoring, ED     Status: Abnormal   Collection Time: 06/30/23  8:51 AM  Result Value Ref Range   Glucose-Capillary 378 (H) 70 - 99 mg/dL    Comment: Glucose reference range applies only to samples taken after fasting for at least 8 hours.  CBG monitoring, ED     Status: Abnormal   Collection Time: 06/30/23 10:52 AM  Result Value Ref Range   Glucose-Capillary 218 (H) 70 - 99 mg/dL    Comment: Glucose reference range applies only to samples taken after fasting for at least 8 hours.  I-Stat Lactic Acid, ED     Status: Abnormal   Collection Time: 06/30/23 11:02 AM  Result Value Ref Range   Lactic Acid, Venous 3.2 (HH) 0.5 - 1.9 mmol/L   Comment NOTIFIED PHYSICIAN   Basic metabolic panel      Status: Abnormal   Collection Time: 06/30/23 12:53 PM  Result Value Ref Range   Sodium 133 (L) 135 - 145 mmol/L   Potassium 5.4 (H) 3.5 - 5.1 mmol/L   Chloride 97 (L) 98 - 111 mmol/L   CO2 21 (L) 22 - 32 mmol/L   Glucose, Bld 238 (H) 70 - 99 mg/dL    Comment: Glucose reference range applies only to samples taken after fasting for at least 8 hours.   BUN 37 (H) 6 - 20 mg/dL   Creatinine, Ser 0.98 (H) 0.44 - 1.00 mg/dL   Calcium 7.5 (L) 8.9 - 10.3 mg/dL   GFR, Estimated 6 (L) >60 mL/min    Comment: (NOTE) Calculated using the CKD-EPI Creatinine Equation (2021)    Anion gap 15 5 - 15    Comment: Performed at [facility], [address], [place], [place] [address]  Beta-hydroxybutyric acid     Status: None   Collection Time: 06/30/23 12:53 PM  Result Value Ref Range   Beta-Hydroxybutyric Acid 0.13  0.05 - 0.27 mmol/L    Comment: Performed at [facility], [address] [place], [place] [address]   US Abdomen Limited RUQ (LIVER/GB) Result Date: 06/30/2023 CLINICAL DATA:  Right upper quadrant pain EXAM: ULTRASOUND ABDOMEN LIMITED RIGHT UPPER QUADRANT COMPARISON:  06/23/2023 FINDINGS: Gallbladder: Small volume material in the dependent gallbladder without shadowing, high-density by prior CT. There is diffuse wall thickening to 6 mm but no focal tenderness or pericholecystic edema. Common bile duct: Diameter: 5 mm Liver: No focal lesion identified. Within normal limits in parenchymal echogenicity. Portal vein is patent on color Doppler imaging with normal direction of blood flow towards the liver. Other: Small volume perihepatic ascites also seen on prior study. IMPRESSION: Cholelithiasis (better seen on prior CT) and distended, thick walled gallbladder but no sonographic Murphy sign typical of acute cholecystitis. Electronically Signed   By: Tiburcio Pea M.D.   On: 06/30/2023 12:14   DG Chest Portable 1 View Result Date: 06/30/2023 CLINICAL DATA:  Shortness of breath. EXAM:  PORTABLE CHEST 1 VIEW COMPARISON:  06/23/2023 FINDINGS: Heart size is normal. Lung volumes are low. No pleural fluid, interstitial edema or airspace disease. Visualized osseous structures appear intact. IMPRESSION: Low lung volumes. No acute findings. Electronically Signed   By: Signa Kell M.D.   On: 06/30/2023 09:02    Assessment/Plan 33 y/o F w/ a hx of ESRD on iHD, DM1, HLD, bipolar disorder, and non-ischemic cardiomyopathy who presents with several days of abdominal pain, emesis, and general malaise and has an US showing cholelithiasis w/ possible cholecystitis   - No indication for urgent surgical intervention. - Continue IV antibiotics and supportive measures with anti-emetics and pain medications - Surgery will continue to follow to assess for potential intervention - Trend LFTs - NPO at [facility]  [facility] 06/30/2023, 2:29 PM Please see Amion for pager number during day hours 7:00am-4:30pm or 7:00am -11:30am on weekends

## 2023-06-30 NOTE — H&P (Signed)
 History and Physical    Patient: Ashley Freeman UEA:[id number] DOB: [birth year] DOA: 06/30/2023 DOS: the patient was seen and examined on 06/30/2023 PCP: [facility]  Patient coming from: Home  Chief Complaint:  Chief Complaint  Patient presents with   Hyperglycemia   HPI: [name] is a 33 y.o. 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 difficile presents with body aches and pain.  She has been experiencing generalized body aches all over for several days.  She also reports fluctuations in body temperature, feeling alternately hot and cold.  Records note patient had been seen in the ED with weakness, nausea, and bodyaches on 2/23 Rx CT scan of the abdomen pelvis noted moderate severity factious versus inflammatory enteritis involving mid to distal jejunum and ileum with abdominopelvic ascites increase severity from previous exam.  Patient had been given pain and nausea patient's with some improvement for which she ultimately was discharged home with prescription for Zofran.  She reports having a persistent cough with intermittent nausea and vomiting.     She manages her diabetes with a sliding scale insulin regimen of one to ten units and takes eight units of Lantus for long-acting insulin, typically in the morning. She has not taken her Lantus today.  She is on dialysis and has a fistula in her right arm.  Last hemodialysis session was on 2/27.  She felt too bad to go to her hemodialysis session yesterday and due to hurting all over.   In the emergency department patient was noted to be afebrile with respirations 10-26, blood pressures elevated up to 150/103, and all other vital signs maintained.  Labs noted WBC 8.6, hemoglobin 11.9, sodium 133, potassium 5.4, CO2 22, BUN 37, creatinine 8.1, glucose 428, anion gap 15, alkaline phosphatase  220, lipase 22, lactic acid 2.8->3.2, and all other labs relatively within normal limits.  Chest x-ray noted low lung volumes.  Ultrasound of the gallbladder noted cholelithiasis and distended thick-walled gallbladder sonographic Murphy sign typical of of acute cholecystitis.  Patient had been given a total of 1 L of saline IV fluids, fentanyl 50 mcg IV, Lokelma 10 g p.o., DuoNeb breathing treatment, Rocephin 2 g IV,   Review of Systems: As mentioned in the history of present illness. All other systems reviewed and are negative. Past Medical History:  Diagnosis Date   Abscess, gluteal, right 08/24/2013   Anemia 02/19/2012   Bartholin's gland abscess 09/19/2013   Bipolar disorder (HCC)    BV (bacterial vaginosis) 11/24/2015   Depression    Diabetes mellitus type I (HCC) 2001   Diagnosed at age 80 ; Type I   Diarrhea 05/30/2016   DKA (diabetic ketoacidoses) 08/19/2013   Also in 2018   ESRD (end stage renal disease) (HCC)    Gonorrhea 08/2011   Treated in 09/2011   HFrEF (heart failure with reduced ejection fraction) (HCC)    a. 2022 Echo: EF 40%; b. 10/2021 Echo: EF 55%; b. 07/2022 MV: No ischemia. EF 31%; c. 08/2022 Echo: EF 35%, mildly dil RV, sev TR.   History of trichomoniasis 05/31/2016   Hyperlipidemia 03/28/2016   Hypertension    NICM (nonischemic cardiomyopathy) (HCC)    Sepsis (HCC) 09/19/2013   Past Surgical History:  Procedure Laterality Date   A/V FISTULAGRAM Right 06/17/2023   Procedure: A/V Fistulagram;  Surgeon: Annice Needy, MD;  Location: [facility] INVASIVE CV  LAB;  Service: Cardiovascular;  Laterality: Right;   AV FISTULA PLACEMENT Right 07/06/2022   Procedure: ARTERIOVENOUS GRAFT CREATION;  Surgeon: Cephus Shelling, MD;  Location: [facility] OR;  Service: Vascular;  Laterality: Right;   CESAREAN SECTION N/A 10/05/2019   Procedure: CESAREAN SECTION;  Surgeon: Yates Center Bing, MD;  Location: [facility] LD ORS;  Service: Obstetrics;  Laterality: N/A;   INCISION AND DRAINAGE ABSCESS Left  09/28/2019   Procedure: INCISION AND DRAINAGE VULVAR ABCESS;  Surgeon: Tilda Burrow, MD;  Location: [facility] OR;  Service: Gynecology;  Laterality: Left;   INCISION AND DRAINAGE PERIRECTAL ABSCESS Right 08/18/2013   Procedure: IRRIGATION AND DEBRIDEMENT GLUTEAL ABSCESS;  Surgeon: Axel Filler, MD;  Location: [facility] OR;  Service: General;  Laterality: Right;   INCISION AND DRAINAGE PERIRECTAL ABSCESS Right 09/19/2013   Procedure: IRRIGATION AND DEBRIDEMENT RIGHT GLUTEAL AND LABIAL ABSCESSES;  Surgeon: Axel Filler, MD;  Location: [facility] OR;  Service: General;  Laterality: Right;   INCISION AND DRAINAGE PERIRECTAL ABSCESS Right 09/24/2013   Procedure: IRRIGATION AND DEBRIDEMENT PERIRECTAL ABSCESS;  Surgeon: Cherylynn Ridges, MD;  Location: [facility] OR;  Service: General;  Laterality: Right;   Social History:  reports that she has never smoked. She has never been exposed to tobacco smoke. She has never used smokeless tobacco. She reports that she does not currently use alcohol. She reports that she does not use drugs.  Allergies  Allergen Reactions   Cephalexin Anaphylaxis    Has gotten ceftriaxone in the past    Morphine Itching   Penicillins Hives and Rash    Has patient had a PCN reaction causing immediate rash, facial/tongue/throat swelling, SOB or lightheadedness with hypotension: Yes Has patient had a PCN reaction causing severe rash involving mucus membranes or skin necrosis: No Has patient had a PCN reaction that required hospitalization: Yes Has patient had a PCN reaction occurring within the last 10 years: No Spoke with pt - childhood hives told by mom, tried no pcns since, doesn't remember reaction herself    Benadryl [Diphenhydramine] Itching   Doxycycline Itching    Family History  Problem Relation Age of Onset   Asthma Mother    Carpal tunnel syndrome Mother    Gout Father    Diabetes Paternal Grandmother    Anesthesia problems Neg Hx     Prior to Admission medications   Medication Sig  Start Date End Date Taking? Authorizing Provider  acetaminophen (TYLENOL) 500 MG tablet Take 1,000 mg by mouth every 6 (six) hours as needed for mild pain or moderate pain.    [provider]  albuterol (VENTOLIN HFA) 108 (90 Base) MCG/ACT inhaler Inhale 2 puffs into the lungs every 4 (four) hours as needed. 11/24/22   [provider]  amLODipine (NORVASC) 10 MG tablet Take 1 tablet (10 mg total) by mouth daily. 11/11/21 06/06/25  Lorin Glass, MD  BAQSIMI TWO PACK 3 MG/DOSE POWD Place 1 spray into both nostrils daily. 05/14/23   [provider]  blood glucose meter kit and supplies KIT Dispense based on patient and insurance preference. Use up to four times daily as directed. (FOR ICD-9 250.00, 250.01). 05/25/17   Jacalyn Lefevre, MD  Blood Pressure Monitoring (BLOOD PRESSURE KIT) DEVI 1 Device by Does not apply route as needed. 05/06/19   Anyanwu, Jethro Bastos, MD  carvedilol (COREG) 25 MG tablet Take 25 mg by mouth 2 (two) times daily with a meal. 05/15/23   [provider]  Continuous Blood Gluc Sensor (DEXCOM G6 SENSOR) MISC  Change sensors every 10 days 07/02/22   Shamleffer, Konrad Dolores, MD  Continuous Blood Gluc Transmit (DEXCOM G6 TRANSMITTER) MISC Inject 1 Device into the skin as directed. Use to check blood sugar daily 07/02/22   Shamleffer, Konrad Dolores, MD  Continuous Glucose Sensor (DEXCOM G7 SENSOR) MISC Change sensors every 10 days 09/07/22   Shamleffer, Konrad Dolores, MD  FLUoxetine (PROZAC) 20 MG capsule Take 1 capsule by mouth daily. 03/23/23   [provider]  hydrOXYzine (ATARAX) 25 MG tablet Take 25 mg by mouth every 4 (four) hours as needed. 05/14/23   [provider]  Insulin Aspart FlexPen (NOVOLOG) 100 UNIT/ML Inject 4 Units into the skin 3 (three) times daily. 05/10/23 06/23/23  [provider]  lamoTRIgine (LAMICTAL) 200 MG tablet Take 200 mg by mouth daily.    [provider]  LANTUS SOLOSTAR 100 UNIT/ML Solostar  Pen SMARTSIG:8 Unit(s) SUB-Q Daily    [provider]  LORazepam (ATIVAN) 1 MG tablet Take 1 mg by mouth daily as needed.    [provider]  losartan (COZAAR) 100 MG tablet Take 1 tablet by mouth daily. 03/25/23   [provider]  ondansetron (ZOFRAN-ODT) 4 MG disintegrating tablet Take 1 tablet (4 mg total) by mouth every 8 (eight) hours as needed for nausea or vomiting. 06/23/23   Laurence Spates, MD  oxyCODONE-acetaminophen (PERCOCET/ROXICET) 5-325 MG tablet Take 1 tablet by mouth every 4 (four) hours as needed for severe pain. 07/10/22   Rhyne, Ames Coupe, PA-C  permethrin (ELIMITE) 5 % cream Apply topically. 05/14/23   [provider]  polyethylene glycol (MIRALAX / GLYCOLAX) 17 g packet Take 17 g by mouth daily as needed. 12/07/22   [provider]  rosuvastatin (CRESTOR) 40 MG tablet Take 40 mg by mouth daily. 05/22/22   [provider]  sevelamer carbonate (RENVELA) 800 MG tablet Take 2,400 mg by mouth 3 (three) times daily with meals.    [provider]  SUMAtriptan (IMITREX) 50 MG tablet Take 50 mg by mouth every 2 (two) hours as needed for migraine or headache. 06/20/22   [provider]    Physical Exam: Vitals:   06/30/23 1100 06/30/23 1115 06/30/23 1253 06/30/23 1253  BP: (!) 137/95 (!) 137/97    Pulse: 73 75    Resp: 18 18    Temp:   97.7 F (36.5 C) 97.7 F (36.5 C)  TempSrc:   Oral Oral  SpO2: 95% 96%    Weight:      Height:         Constitutional: Young female who lethargic and appears chronically ill Eyes: PERRL, lids and conjunctivae normal ENMT: Mucous membranes are dry.  Normal dentition.  Neck: normal, supple  Respiratory: clear to auscultation bilaterally, no wheezing, no crackles. Normal respiratory effort. No accessory muscle use.  Cardiovascular: Regular rate and rhythm, no murmurs / rubs / gallops. No extremity edema. 2+ pedal pulses.  Right upper extremity AV fistula with palpable  thrill Abdomen: Moderate epigastric tenderness to palpation with guarding. Musculoskeletal: no clubbing / cyanosis. No joint deformity upper and lower extremities. Good ROM, no contractures. Normal muscle tone.  Skin: Multiple healed skin wound present. Neurologic: CN 2-12 grossly intact.  Strength 5/5 in all 4.  Psychiatric: Normal judgment and insight. Alert and oriented x 3. Normal mood.   Data Reviewed:  Reviewed labs, imaging, and pertinent records as documented.  EKG reveals sinus rhythm at 75 bpm with left atrial enlargement and prolonged QT of  505.  Assessment and Plan:  Suspected cholecystitis Patient presents with complaints of generalized body aches with nausea, vomiting, and cough.  Abdominal ultrasound noted cholelithiasis and distended thick-walled gallbladder, but no sonographic Murphy sign typical of acute cholecystitis was present.  General surgery had been formally consulted. -Admit to a telemetry bed -Clear liquid diet -Continue Rocephin and metronidazole IV -Fentanyl IV as needed for -General Surgery consulted,  will follow-up for any further recommendations  Uncontrolled diabetes mellitus type 1 On admission glucose was initially noted to be elevated greater than 428 without elevated anion gap.  Venous pH was noted to be within normal limits at 7.14.  Last hemoglobin A1c 10.6 when checked on 05/21/2023. -Hypoglycemic protocols -Lantus 8 units daily -CBGs before every meal with sensitive SSI  Lactic acidosis Acute.  Initial lactic acid 2.8 with repeat check was 3.2.  Patient had been given 1 L of IV fluids.  ESRD on HD Hyperkalemia Patient is on a TTS schedule.  Last hemodialysis was on 2/27.  Patient missed her 3/1 session due to feeling poorly.  Labs  significant for potassium 5.4, CO2 21, BUN 37, and creatinine 8.1.  Patient had been given 10 g of Lokelma for hyperkalemia as well as DuoNeb breathing treatment. -Dr. Thedore Mins of nephrology consulted and plans on  dialysis tomorrow.  Prolonged QT interval QTc noted to be 505. -Avoid possible QT prolonging medications -Correct electrolyte abnormalities  Essential hypertension -Continue home blood pressure regimen  Bipolar disorder -Continue Prozac     DVT prophylaxis: Heparin Advance Care Planning:   Code Status: Full Code   Consults:   Family Communication: Patient's mother updated over the phone  Severity of Illness: The appropriate patient status for this patient is INPATIENT. Inpatient status is judged to be reasonable and necessary in order to provide the required intensity of service to ensure the patient's safety. The patient's presenting symptoms, physical exam findings, and initial radiographic and laboratory data in the context of their chronic comorbidities is felt to place them at high risk for further clinical deterioration. Furthermore, it is not anticipated that the patient will be medically stable for discharge from the hospital within 2 midnights of admission.   * I certify that at the point of admission it is my clinical judgment that the patient will require inpatient hospital care spanning beyond 2 midnights from the point of admission due to high intensity of service, high risk for further deterioration and high frequency of surveillance required.*  Author: Clydie Braun, MD 06/30/2023 1:50 PM  or on call review [email or web address].

## 2023-06-30 NOTE — ED Notes (Signed)
 Nephrology at bedside

## 2023-06-30 NOTE — ED Triage Notes (Signed)
 BIB [facility] from home for ILI and missed HD. Endorses body  aches, HA, genmeral weakness, NV, non-productive cough. Last Thursday was last HD. Missed Saturday. Dx'd in ED last week with non-specific viral illness. VSS. 12 lead unremarkable. EDP at [facility] on arrival. Pt alert, NAD, calm, interactive.

## 2023-06-30 NOTE — Consult Note (Signed)
 ESRD Consult Note  Reason for consult: ESRD, provision of dialysis  Assessment/Recommendations:  ESRD -outpatient HD orders: [facility] MWF. Clinic not open, not able to obtain outpatient records, will try to get records tomorrow -HD tomorrow per TTS schedule. Tentatively planning for HD tomorrow off schedule depending on clinical status/labs otherwise if okay can wait until Tues  N/V DKA vs cholecystitis? -per primary service  Uncontrolled DM1 with hyperglycemia -per primary service  Cholelithiasis -Per primary service -possible cholecystitis -surgery consulted  Hyperkalemia -suspecting this is related to hyperglycemia -recheck pending, can medically manage in the interim with lokelma etc, HD planned for tomorrow -lokelma 10g tid for now  Volume/ hypertension  -UF as tolerated, BP acceptable  Anemia of Chronic Kidney Disease Hemoglobin 11.9.  -Transfuse PRN for Hgb <7  Secondary Hyperparathyroidism/Hyperphosphatemia - resumed home binders when able to take PO  Lactic acidosis -per primary, s/p fluids--avoid fluids for now   Anthony Sar, MD [facility]  History of Present Illness: Ashley Freeman is a/an 33 y.o. female with a past medical history of ESRD DM1, HFrEF, nonischemic cardiomyopathy, hypertension who presents with nausea/vomiting.  Found to have severe hyperglycemia with a glucose up to 428.  Potassium was also elevated at 5.4.  She received med management for her hyperkalemia, down to 5.2 on repeat.  Also with lactic acidosis.  She did receive a total of 1 L of normal saline. Chest x-ray with low lung volumes otherwise no acute findings.  Right upper quadrant ultrasound showed cholelithiasis with distended/thick-walled gallbladder. Patient seen and examined bedside in ER. No complaints currently. Resting comfortably. About to get transported to room.   Medications:  No current facility-administered medications for this  encounter.   Current Outpatient Medications  Medication Sig Dispense Refill   acetaminophen (TYLENOL) 500 MG tablet Take 1,000 mg by mouth every 6 (six) hours as needed for mild pain or moderate pain.     albuterol (VENTOLIN HFA) 108 (90 Base) MCG/ACT inhaler Inhale 2 puffs into the lungs every 4 (four) hours as needed.     amLODipine (NORVASC) 10 MG tablet Take 1 tablet (10 mg total) by mouth daily. 30 tablet 2   BAQSIMI TWO PACK 3 MG/DOSE POWD Place 1 spray into both nostrils daily.     blood glucose meter kit and supplies KIT Dispense based on patient and insurance preference. Use up to four times daily as directed. (FOR ICD-9 250.00, 250.01). 1 each 0   Blood Pressure Monitoring (BLOOD PRESSURE KIT) DEVI 1 Device by Does not apply route as needed. 1 each 0   carvedilol (COREG) 25 MG tablet Take 25 mg by mouth 2 (two) times daily with a meal.     Continuous Blood Gluc Sensor (DEXCOM G6 SENSOR) MISC Change sensors every 10 days 9 each 3   Continuous Blood Gluc Transmit (DEXCOM G6 TRANSMITTER) MISC Inject 1 Device into the skin as directed. Use to check blood sugar daily 1 each 3   Continuous Glucose Sensor (DEXCOM G7 SENSOR) MISC Change sensors every 10 days 3 each 3   FLUoxetine (PROZAC) 20 MG capsule Take 1 capsule by mouth daily.     hydrOXYzine (ATARAX) 25 MG tablet Take 25 mg by mouth every 4 (four) hours as needed.     Insulin Aspart FlexPen (NOVOLOG) 100 UNIT/ML Inject 4 Units into the skin 3 (three) times daily.     lamoTRIgine (LAMICTAL) 200 MG tablet Take 200 mg by mouth daily.     LANTUS SOLOSTAR 100 UNIT/ML  Solostar Pen SMARTSIG:8 Unit(s) SUB-Q Daily     LORazepam (ATIVAN) 1 MG tablet Take 1 mg by mouth daily as needed.     losartan (COZAAR) 100 MG tablet Take 1 tablet by mouth daily.     ondansetron (ZOFRAN-ODT) 4 MG disintegrating tablet Take 1 tablet (4 mg total) by mouth every 8 (eight) hours as needed for nausea or vomiting. 10 tablet 0   oxyCODONE-acetaminophen  (PERCOCET/ROXICET) 5-325 MG tablet Take 1 tablet by mouth every 4 (four) hours as needed for severe pain. 15 tablet 0   permethrin (ELIMITE) 5 % cream Apply topically.     polyethylene glycol (MIRALAX / GLYCOLAX) 17 g packet Take 17 g by mouth daily as needed.     rosuvastatin (CRESTOR) 40 MG tablet Take 40 mg by mouth daily.     sevelamer carbonate (RENVELA) 800 MG tablet Take 2,400 mg by mouth 3 (three) times daily with meals.     SUMAtriptan (IMITREX) 50 MG tablet Take 50 mg by mouth every 2 (two) hours as needed for migraine or headache.       ALLERGIES Cephalexin, Morphine, Penicillins, Benadryl [diphenhydramine], and Doxycycline  MEDICAL HISTORY Past Medical History:  Diagnosis Date   Abscess, gluteal, right 08/24/2013   Anemia 02/19/2012   Bartholin's gland abscess 09/19/2013   Bipolar disorder (HCC)    BV (bacterial vaginosis) 11/24/2015   Depression    Diabetes mellitus type I (HCC) 2001   Diagnosed at age 23 ; Type I   Diarrhea 05/30/2016   DKA (diabetic ketoacidoses) 08/19/2013   Also in 2018   ESRD (end stage renal disease) (HCC)    Gonorrhea 08/2011   Treated in 09/2011   HFrEF (heart failure with reduced ejection fraction) (HCC)    a. 2022 Echo: EF 40%; b. 10/2021 Echo: EF 55%; b. 07/2022 MV: No ischemia. EF 31%; c. 08/2022 Echo: EF 35%, mildly dil RV, sev TR.   History of trichomoniasis 05/31/2016   Hyperlipidemia 03/28/2016   Hypertension    NICM (nonischemic cardiomyopathy) (HCC)    Sepsis (HCC) 09/19/2013     SOCIAL HISTORY Social History   Socioeconomic History   Marital status: Single    Spouse name: Not on file   Number of children: 0   Years of education: 11th grade   Highest education level: Not on file  Occupational History   Occupation: unemployed    Comment: has never worked  Tobacco Use   Smoking status: Never    Passive exposure: Never   Smokeless tobacco: Never  Vaping Use   Vaping status: Never Used  Substance and Sexual Activity    Alcohol use: Not Currently   Drug use: No   Sexual activity: Yes    Birth control/protection: None  Other Topics Concern   Not on file  Social History Narrative   Patient lives in [place] mother lives in [place].  Unemployed.  Previously worked for a Customer service manager.  Completed 11 grade working on [employer]. Patient 3 brothers    Social Drivers of Corporate investment banker Strain: High Risk (11/22/2022)   Received from [facility]   Overall Financial Resource Strain (CARDIA)    Difficulty of Paying Living Expenses: Hard  Food Insecurity: No Food Insecurity (06/30/2023)   Hunger Vital Sign    Worried About Running Out of Food in the Last Year: Never true    Ran Out of Food in the Last Year: Never true  Transportation Needs: No Transportation Needs (06/30/2023)   PRAPARE -  Administrator, Civil Service (Medical): No    Lack of Transportation (Non-Medical): No  Physical Activity: Insufficiently Active (03/02/2022)   Received from [facility]   Exercise Vital Sign    Days of Exercise per Week: 2 days    Minutes of Exercise per Session: 10 min  Stress: No Stress Concern Present (03/08/2023)   Received from [facility] - Occupational Stress Questionnaire    Feeling of Stress : Not at all  Social Connections: Moderately Integrated (06/30/2023)   Social Connection and Isolation Panel [NHANES]    Frequency of Communication with Friends and Family: More than three times a week    Frequency of Social Gatherings with Friends and Family: More than three times a week    Attends Religious Services: 1 to 4 times per year    Active Member of [employer] or Organizations: No    Attends Banker Meetings: 1 to 4 times per year    Marital Status: Never married  Intimate Partner Violence: Not At Risk (06/30/2023)   Humiliation, Afraid, Rape, and Kick questionnaire    Fear of Current or Ex-Partner: No    Emotionally  Abused: No    Physically Abused: No    Sexually Abused: No     FAMILY HISTORY Family History  Problem Relation Age of Onset   Asthma Mother    Carpal tunnel syndrome Mother    Gout Father    Diabetes Paternal Grandmother    Anesthesia problems Neg Hx      Review of Systems: 12 systems were reviewed and negative except per HPI  Physical Exam: Vitals:   06/30/23 1253 06/30/23 1253  BP:    Pulse:    Resp:    Temp: 97.7 F (36.5 C) 97.7 F (36.5 C)  SpO2:     No intake/output data recorded. No intake or output data in the 24 hours ending 06/30/23 1342 General: well-appearing, no acute distress HEENT: anicteric sclera, MMM CV: normal rate, no murmurs, no edema Lungs: bilateral chest rise, normal wob Abd: soft, non-tender, non-distended Skin: no visible lesions or rashes Psych: alert, engaged, appropriate mood and affect Neuro: normal speech, no gross focal deficits  Dialysis access: rue avf +b/t  Test Results Reviewed Lab Results  Component Value Date   NA 133 (L) 06/30/2023   K 5.2 (H) 06/30/2023   CL 96 (L) 06/30/2023   CO2 22 06/30/2023   BUN 37 (H) 06/30/2023   CREATININE 8.10 (H) 06/30/2023   GFR 27.98 (L) 05/24/2021   CALCIUM 7.9 (L) 06/30/2023   ALBUMIN 3.3 (L) 06/30/2023   PHOS 6.4 (H) 09/08/2022    I have reviewed relevant outside healthcare records

## 2023-06-30 NOTE — Plan of Care (Signed)
   Problem: Education: Goal: Knowledge of General Education information will improve Description Including pain rating scale, medication(s)/side effects and non-pharmacologic comfort measures Outcome: Progressing   Problem: Health Behavior/Discharge Planning: Goal: Ability to manage health-related needs will improve Outcome: Progressing

## 2023-07-01 ENCOUNTER — Ambulatory Visit (HOSPITAL_COMMUNITY): Payer: Medicaid Other

## 2023-07-01 ENCOUNTER — Inpatient Hospital Stay (HOSPITAL_COMMUNITY)

## 2023-07-01 DIAGNOSIS — I5021 Acute systolic (congestive) heart failure: Secondary | ICD-10-CM | POA: Diagnosis not present

## 2023-07-01 DIAGNOSIS — I5042 Chronic combined systolic (congestive) and diastolic (congestive) heart failure: Secondary | ICD-10-CM | POA: Diagnosis not present

## 2023-07-01 DIAGNOSIS — Z01810 Encounter for preprocedural cardiovascular examination: Secondary | ICD-10-CM | POA: Diagnosis not present

## 2023-07-01 DIAGNOSIS — K819 Cholecystitis, unspecified: Secondary | ICD-10-CM | POA: Diagnosis not present

## 2023-07-01 LAB — BETA-HYDROXYBUTYRIC ACID: Beta-Hydroxybutyric Acid: 0.11 mmol/L (ref 0.05–0.27)

## 2023-07-01 LAB — COMPREHENSIVE METABOLIC PANEL
ALT: 30 U/L (ref 0–44)
AST: 24 U/L (ref 15–41)
Albumin: 3.3 g/dL — ABNORMAL LOW (ref 3.5–5.0)
Alkaline Phosphatase: 189 U/L — ABNORMAL HIGH (ref 38–126)
Anion gap: 14 (ref 5–15)
BUN: 40 mg/dL — ABNORMAL HIGH (ref 6–20)
CO2: 21 mmol/L — ABNORMAL LOW (ref 22–32)
Calcium: 7.6 mg/dL — ABNORMAL LOW (ref 8.9–10.3)
Chloride: 99 mmol/L (ref 98–111)
Creatinine, Ser: 8.73 mg/dL — ABNORMAL HIGH (ref 0.44–1.00)
GFR, Estimated: 6 mL/min — ABNORMAL LOW (ref >60)
Glucose, Bld: 92 mg/dL (ref 70–99)
Potassium: 5.5 mmol/L — ABNORMAL HIGH (ref 3.5–5.1)
Sodium: 134 mmol/L — ABNORMAL LOW (ref 135–145)
Total Bilirubin: 0.8 mg/dL (ref 0.0–1.2)
Total Protein: 5.8 g/dL — ABNORMAL LOW (ref 6.5–8.1)

## 2023-07-01 LAB — BASIC METABOLIC PANEL
Anion gap: 14 (ref 5–15)
BUN: 42 mg/dL — ABNORMAL HIGH (ref 6–20)
CO2: 25 mmol/L (ref 22–32)
Calcium: 7.5 mg/dL — ABNORMAL LOW (ref 8.9–10.3)
Chloride: 98 mmol/L (ref 98–111)
Creatinine, Ser: 8.4 mg/dL — ABNORMAL HIGH (ref 0.44–1.00)
GFR, Estimated: 6 mL/min — ABNORMAL LOW (ref >60)
Glucose, Bld: 136 mg/dL — ABNORMAL HIGH (ref 70–99)
Potassium: 4.9 mmol/L (ref 3.5–5.1)
Sodium: 137 mmol/L (ref 135–145)

## 2023-07-01 LAB — GLUCOSE, CAPILLARY
Glucose-Capillary: 64 mg/dL — ABNORMAL LOW (ref 70–99)
Glucose-Capillary: 111 mg/dL — ABNORMAL HIGH (ref 70–99)
Glucose-Capillary: 99 mg/dL (ref 70–99)
Glucose-Capillary: 81 mg/dL (ref 70–99)
Glucose-Capillary: 80 mg/dL (ref 70–99)
Glucose-Capillary: 156 mg/dL — ABNORMAL HIGH (ref 70–99)

## 2023-07-01 LAB — ECHOCARDIOGRAM COMPLETE
Area-P 1/2: 4.68 cm2
Calc EF: 25.7 %
Height: 63 in
S' Lateral: 4.2 cm
Single Plane A2C EF: 23.5 %
Single Plane A4C EF: 31.3 %
Weight: 2128 [oz_av]

## 2023-07-01 LAB — CBC
HCT: 40.9 % (ref 36.0–46.0)
Hemoglobin: 12.9 g/dL (ref 12.0–15.0)
MCH: 26.4 pg (ref 26.0–34.0)
MCHC: 31.5 g/dL (ref 30.0–36.0)
MCV: 83.6 fL (ref 80.0–100.0)
Platelets: 198 10*3/uL (ref 150–400)
RBC: 4.89 MIL/uL (ref 3.87–5.11)
RDW: 15.7 % — ABNORMAL HIGH (ref 11.5–15.5)
WBC: 8.9 10*3/uL (ref 4.0–10.5)
nRBC: 0 % (ref 0.0–0.2)

## 2023-07-01 MED ORDER — PENTAFLUOROPROP-TETRAFLUOROETH EX AERO: 1.0000 | INHALATION_SPRAY | CUTANEOUS | Status: DC | PRN

## 2023-07-01 MED ORDER — LIDOCAINE HCL (PF) 1 % IJ SOLN: 5.0000 mL | INTRAMUSCULAR | Status: DC | PRN

## 2023-07-01 MED ORDER — INSULIN GLARGINE 100 UNIT/ML ~~LOC~~ SOLN
6.0000 [IU] | SUBCUTANEOUS | Status: DC
  Administered 2023-07-01: 6 [IU] via SUBCUTANEOUS
  Filled 2023-07-01 (×2): qty 0.06

## 2023-07-01 MED ORDER — ALTEPLASE 2 MG IJ SOLR: 2.0000 mg | Freq: Once | INTRAMUSCULAR | Status: DC | PRN

## 2023-07-01 MED ORDER — LIDOCAINE-PRILOCAINE 2.5-2.5 % EX CREA: 1.0000 | TOPICAL_CREAM | CUTANEOUS | Status: DC | PRN

## 2023-07-01 MED ORDER — HEPARIN SODIUM (PORCINE) 1000 UNIT/ML DIALYSIS: 1000.0000 [IU] | INTRAMUSCULAR | Status: DC | PRN

## 2023-07-01 MED ORDER — ANTICOAGULANT SODIUM CITRATE 4% (200MG/5ML) IV SOLN: 5.0000 mL | Status: DC | PRN

## 2023-07-01 MED ORDER — DICYCLOMINE HCL 10 MG PO CAPS
10.0000 mg | ORAL_CAPSULE | Freq: Three times a day (TID) | ORAL | Status: DC | PRN
  Administered 2023-07-01: 10 mg via ORAL
  Filled 2023-07-01: qty 1

## 2023-07-01 MED ORDER — LOPERAMIDE HCL 2 MG PO CAPS
2.0000 mg | ORAL_CAPSULE | Freq: Four times a day (QID) | ORAL | Status: DC | PRN
  Administered 2023-07-01 – 2023-07-05 (×4): 2 mg via ORAL
  Filled 2023-07-01 (×4): qty 1

## 2023-07-01 NOTE — Progress Notes (Signed)
 Subjective: CC: Patient reports ongoing epigastric and right upper quadrant abdominal pain.  Reports this was worse with p.o. intake.  Associated nausea.  No vomiting.  Has not had any p.o. intake today.  Reports history of prior PD catheter placement.  Only did PD for a few months.  Reports she stopped this due to childcare.  Currently on HD, Tuesday/Thursday/Saturday.  Her last dialysis session was Thursday, missed Saturday.  She denies any other abdominal surgeries.  She is not on blood thinners at baseline.  Afebrile. No tachycardia or hypotension. WBC wnl. LFT's pending.   Objective: Vital signs in last 24 hours: Temp:  [97.5 F (36.4 C)-98.6 F (37 C)] 97.5 F (36.4 C) (03/03 0844) Pulse Rate:  [63-77] 65 (03/03 0844) Resp:  [14-23] 16 (03/03 0844) BP: (110-154)/(76-107) 126/86 (03/03 0844) SpO2:  [95 %-100 %] 100 % (03/03 0844) Last BM Date : 06/29/23  Intake/Output from previous day: 03/02 0701 - 03/03 0700 In: 396.4 [P.O.:200; IV Piggyback:196.4] Out: -  Intake/Output this shift: No intake/output data recorded.  PE: Gen:  Alert, NAD, pleasant Abd: Soft, mild distension, epigastric and RUQ ttp, +BS  Lab Results:  Recent Labs    06/30/23 0815 06/30/23 0826 07/01/23 0900  WBC 8.6  --  8.9  HGB 11.9* 13.9 12.9  HCT 39.6 41.0 40.9  PLT 191  --  198   BMET Recent Labs    06/30/23 1940 06/30/23 2331  NA 136 137  K 5.3* 4.9  CL 97* 98  CO2 23 25  GLUCOSE 193* 136*  BUN 40* 42*  CREATININE 8.35* 8.40*  CALCIUM 7.5* 7.5*   PT/INR No results for input(s): "LABPROT", "INR" in the last 72 hours. CMP     Component Value Date/Time   NA 137 06/30/2023 2331   NA 136 07/13/2021 1553   NA 137 11/12/2013 0423   K 4.9 06/30/2023 2331   K 4.2 11/12/2013 0423   CL 98 06/30/2023 2331   CL 107 11/12/2013 0423   CO2 25 06/30/2023 2331   CO2 23 11/12/2013 0423   GLUCOSE 136 (H) 06/30/2023 2331   GLUCOSE 339 (H) 11/12/2013 0423   BUN 42 (H) 06/30/2023  2331   BUN 51 (H) 07/13/2021 1553   BUN 7 11/12/2013 0423   CREATININE 8.40 (H) 06/30/2023 2331   CREATININE 0.59 (L) 11/12/2013 0423   CREATININE 0.51 08/24/2013 0957   CALCIUM 7.5 (L) 06/30/2023 2331   CALCIUM 8.2 (L) 11/12/2013 0423   PROT 5.9 (L) 06/30/2023 0815   PROT 5.9 (L) 09/08/2019 0945   PROT 8.0 11/10/2013 2043   ALBUMIN 3.3 (L) 06/30/2023 0815   ALBUMIN 2.9 (L) 09/08/2019 0945   ALBUMIN 3.9 11/10/2013 2043   AST 36 06/30/2023 0815   AST 12 (L) 11/10/2013 2043   ALT 36 06/30/2023 0815   ALT 10 (L) 11/10/2013 2043   ALKPHOS 220 (H) 06/30/2023 0815   ALKPHOS 77 11/10/2013 2043   BILITOT 1.1 06/30/2023 0815   BILITOT <0.2 09/08/2019 0945   BILITOT 0.5 11/10/2013 2043   GFRNONAA 6 (L) 06/30/2023 2331   GFRNONAA >60 11/12/2013 0423   GFRNONAA >89 08/24/2013 0957   GFRAA 44 (L) 01/30/2020 1637   GFRAA >60 11/12/2013 0423   GFRAA >89 08/24/2013 0957   Lipase     Component Value Date/Time   LIPASE 22 06/30/2023 0815   LIPASE 55 (L) 09/10/2011 1356    Studies/Results: US Abdomen Limited RUQ (LIVER/GB) Result Date: 06/30/2023 CLINICAL DATA:  Right upper quadrant pain EXAM: ULTRASOUND ABDOMEN LIMITED RIGHT UPPER QUADRANT COMPARISON:  06/23/2023 FINDINGS: Gallbladder: Small volume material in the dependent gallbladder without shadowing, high-density by prior CT. There is diffuse wall thickening to 6 mm but no focal tenderness or pericholecystic edema. Common bile duct: Diameter: 5 mm Liver: No focal lesion identified. Within normal limits in parenchymal echogenicity. Portal vein is patent on color Doppler imaging with normal direction of blood flow towards the liver. Other: Small volume perihepatic ascites also seen on prior study. IMPRESSION: Cholelithiasis (better seen on prior CT) and distended, thick walled gallbladder but no sonographic Murphy sign typical of acute cholecystitis. Electronically Signed   By: Tiburcio Pea M.D.   On: 06/30/2023 12:14   DG Chest Portable 1  View Result Date: 06/30/2023 CLINICAL DATA:  Shortness of breath. EXAM: PORTABLE CHEST 1 VIEW COMPARISON:  06/23/2023 FINDINGS: Heart size is normal. Lung volumes are low. No pleural fluid, interstitial edema or airspace disease. Visualized osseous structures appear intact. IMPRESSION: Low lung volumes. No acute findings. Electronically Signed   By: Signa Kell M.D.   On: 06/30/2023 09:02    Anti-infectives: Anti-infectives (From admission, onward)    Start     Dose/Rate Route Frequency Ordered Stop   07/01/23 1200  cefTRIAXone (ROCEPHIN) 2 g in sodium chloride 0.9 % 100 mL IVPB        2 g 200 mL/hr over 30 Minutes Intravenous Every 24 hours 06/30/23 1731     06/30/23 1830  metroNIDAZOLE (FLAGYL) IVPB 500 mg        500 mg 100 mL/hr over 60 Minutes Intravenous Every 12 hours 06/30/23 1731     06/30/23 1245  cefTRIAXone (ROCEPHIN) 2 g in sodium chloride 0.9 % 100 mL IVPB        2 g 200 mL/hr over 30 Minutes Intravenous  Once 06/30/23 1239 06/30/23 1402        Assessment/Plan Acute Cholecystitis - RUQ [place] with cholelithiasis and distended, thick-walled gallbladder.  CBD 5 mm.   - AST/ALT/T. bili within normal limits on admission.  She has a chronically elevated alk phos that is near her baseline on most recent labs.  Repeat LFTs are pending today.  WBC wnl. Repeat labs in AM written for.  - Reviewed case with attending.  Recommend cardiology consult for cardiac risk stratification and optimization - discussed with TRH who will plan to obtain this today.   - Would be ideal if patient could get HD pre-op as she has not had this since Thursday.  Nephrology following.  - If patient is cleared by cardiology and is able to have HD today, tentatively plan for OR for laparoscopic cholecystectomy in the morning.  FEN - CLD. NPO midnight.  VTE - SCDs, okay for chem ppx from a general surgery standpoint ID - Rocephin/Flagyl. Does not need flagyl from our standpoint.   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   LOS: 1 day    Jacinto Halim, [facility] Surgery 07/01/2023, 10:25 AM Please see Amion for pager number during day hours 7:00am-4:30pm

## 2023-07-01 NOTE — Consult Note (Addendum)
 Cardiology Consultation   Patient ID: LIAHNA BRICKNER MRN: [id number]; DOB: 11/11/1990  Admit date: 06/30/2023 Date of Consult: 07/01/2023  PCP:  [facility]   [facility] Providers Cardiologist:  [facility] cardiology    Patient Profile:   NOVIA LANSBERRY is a 33 y.o. female with a hx of type I DM with hx of DKA admission and on insulin pump, ESRD on HD TTS, HTN, HLD, bipolar d/o, NICM, and HFrEF,   who is being seen 07/01/2023 for the evaluation of pre-op evaluation for lap chole at the request of Dr Jerral Ralph.  History of Present Illness:   [name] with above PMH presented to ER yesterday for generalized body ache, nausea, emesis. Abdomen ultrasound showed cholelithiasis and distended, thick walled gallbladder but no sonographic Murphy sign typical of acute cholecystitis. Labs showed significant hyperglycemia, hyperkalemia, elevated alka phos, lactic acidosis up to 3.2, and ESRD since admission. She was admitted to hospitalist, seen by general surgery, recommended cholecystectomy tomorrow. Pre-op risk evaluation is now requested to cardiology today.   Upon encounter, patient states she had generalized body pain and abdominal pain. She feels poor for some time now. She states she has a three year old toddler at home. She is quite busy with the child and household work. She is able to clean the house on the regular basis. She can't climb up stairs due to legs weakness. She is able to walk 2 blocks. She denied any chest pain, orthopnea, syncope,  states she gets SOB and swelling typically before her dialysis. She is take her coreg and losartan, also on Bumex 3mg  AM and 2mg  PM at baseline. She is urinating regularly. She felt her heart has been doing OK and has no concerns or questions regarding this. She continues to follow [facility] cardiology. She states she is on the list for pancreas transplant and suppose to get kidney transplant with her brother  being the donor.    Per chart review, she is followed by cardiology at [facility]. Echo in 2022 showed EF of 40%, though this improved 55% by Echo in 10/2021 with GDMT.  Pre-transplant workup stress myoview on 07/30/2022 showed no ischemia or scar, EF 31%. She saw [facility] cardiology on 08/31/22 and c/o orthopnea and lower leg edema. Torsemide was increased to 80 BID on nondialysis days.  Last available Echo from [facility] was from 09/05/22 showed LVEF down to 35%, low normal RV systolic function, severe TR. During hospitalization for hypoglycemia Ulice Dash, inpatient cardiology [facility]) was consulted 08/29/22 at [facility] for cardiomyopathy and BNP 2600.  She was felt not volume overloaded and advised continue her home GDMT once stable. She was last seen by cardiology at [facility] 03/25/23, losartan was increased to 100mg  and coreg was increased to 25mg  AM and 12.5mg  PM.     Past Medical History:  Diagnosis Date   Abscess, gluteal, right 08/24/2013   Anemia 02/19/2012   Bartholin's gland abscess 09/19/2013   Bipolar disorder (HCC)    BV (bacterial vaginosis) 11/24/2015   Depression    Diabetes mellitus type I (HCC) 2001   Diagnosed at age 9 ; Type I   Diarrhea 05/30/2016   DKA (diabetic ketoacidoses) 08/19/2013   Also in 2018   ESRD (end stage renal disease) (HCC)    Gonorrhea 08/2011   Treated in 09/2011   HFrEF (heart failure with reduced ejection fraction) (HCC)    a. 2022 Echo: EF 40%; b. 10/2021 Echo: EF 55%; b. 07/2022 MV: No ischemia. EF 31%; c.  08/2022 Echo: EF 35%, mildly dil RV, sev TR.   History of trichomoniasis 05/31/2016   Hyperlipidemia 03/28/2016   Hypertension    NICM (nonischemic cardiomyopathy) (HCC)    Sepsis (HCC) 09/19/2013    Past Surgical History:  Procedure Laterality Date   A/V FISTULAGRAM Right 06/17/2023   Procedure: A/V Fistulagram;  Surgeon: Annice Needy, MD;  Location: [facility] INVASIVE CV LAB;  Service: Cardiovascular;  Laterality: Right;   AV FISTULA PLACEMENT Right 07/06/2022   Procedure:  ARTERIOVENOUS GRAFT CREATION;  Surgeon: Cephus Shelling, MD;  Location: [facility] OR;  Service: Vascular;  Laterality: Right;   CESAREAN SECTION N/A 10/05/2019   Procedure: CESAREAN SECTION;  Surgeon: DeLand Bing, MD;  Location: [facility] LD ORS;  Service: Obstetrics;  Laterality: N/A;   INCISION AND DRAINAGE ABSCESS Left 09/28/2019   Procedure: INCISION AND DRAINAGE VULVAR ABCESS;  Surgeon: Tilda Burrow, MD;  Location: [facility] OR;  Service: Gynecology;  Laterality: Left;   INCISION AND DRAINAGE PERIRECTAL ABSCESS Right 08/18/2013   Procedure: IRRIGATION AND DEBRIDEMENT GLUTEAL ABSCESS;  Surgeon: Axel Filler, MD;  Location: [facility] OR;  Service: General;  Laterality: Right;   INCISION AND DRAINAGE PERIRECTAL ABSCESS Right 09/19/2013   Procedure: IRRIGATION AND DEBRIDEMENT RIGHT GLUTEAL AND LABIAL ABSCESSES;  Surgeon: Axel Filler, MD;  Location: [facility] OR;  Service: General;  Laterality: Right;   INCISION AND DRAINAGE PERIRECTAL ABSCESS Right 09/24/2013   Procedure: IRRIGATION AND DEBRIDEMENT PERIRECTAL ABSCESS;  Surgeon: Cherylynn Ridges, MD;  Location: [facility] OR;  Service: General;  Laterality: Right;     Home Medications:  Prior to Admission medications   Medication Sig Start Date End Date Taking? Authorizing Provider  acetaminophen (TYLENOL) 500 MG tablet Take 1,000 mg by mouth as needed for mild pain (pain score 1-3) or moderate pain (pain score 4-6).   Yes [provider]  losartan (COZAAR) 100 MG tablet Take 1 tablet by mouth daily. 03/25/23  Yes [provider]  albuterol (VENTOLIN HFA) 108 (90 Base) MCG/ACT inhaler Inhale 2 puffs into the lungs every 4 (four) hours as needed. Patient not taking: Reported on 07/01/2023 11/24/22   [provider]  amLODipine (NORVASC) 10 MG tablet Take 1 tablet (10 mg total) by mouth daily. Patient not taking: Reported on 06/30/2023 11/11/21 06/06/25  Lorin Glass, MD  BAQSIMI TWO PACK 3 MG/DOSE POWD Place 1 spray into both nostrils daily. 05/14/23    [provider]  bumetanide (BUMEX) 2 MG tablet Take 2 mg by mouth See admin instructions. 3 tablets in the morning, and 2 tablets nightly    [provider]  carvedilol (COREG) 25 MG tablet Take 25 mg by mouth 2 (two) times daily with a meal. 05/15/23   [provider]  Continuous Blood Gluc Sensor (DEXCOM G6 SENSOR) MISC Change sensors every 10 days 07/02/22   Shamleffer, Konrad Dolores, MD  Continuous Blood Gluc Transmit (DEXCOM G6 TRANSMITTER) MISC Inject 1 Device into the skin as directed. Use to check blood sugar daily 07/02/22   Shamleffer, Konrad Dolores, MD  Continuous Glucose Sensor (DEXCOM G7 SENSOR) MISC Change sensors every 10 days 09/07/22   Shamleffer, Konrad Dolores, MD  FLUoxetine (PROZAC) 20 MG capsule Take 1 capsule by mouth daily. 03/23/23   [provider]  hydrOXYzine (ATARAX) 25 MG tablet Take 25 mg by mouth in the morning and at bedtime. 05/14/23   [provider]  Insulin Aspart FlexPen (NOVOLOG) 100 UNIT/ML Inject 4 Units into the skin 3 (three) times daily. 05/10/23 06/23/23  [provider]  lamoTRIgine (LAMICTAL) 200 MG tablet Take 200 mg by mouth daily.    [provider]  LANTUS SOLOSTAR 100 UNIT/ML Solostar Pen SMARTSIG:8 Unit(s) SUB-Q Daily    [provider]  levothyroxine (SYNTHROID) 100 MCG tablet Take 100 mcg by mouth daily before breakfast.    [provider]  LORazepam (ATIVAN) 1 MG tablet Take 1 mg by mouth daily as needed for anxiety.    [provider]  ondansetron (ZOFRAN-ODT) 4 MG disintegrating tablet Take 1 tablet (4 mg total) by mouth every 8 (eight) hours as needed for nausea or vomiting. 06/23/23   Laurence Spates, MD  permethrin (ELIMITE) 5 % cream Apply topically. 05/14/23   [provider]  polyethylene glycol (MIRALAX / GLYCOLAX) 17 g packet Take 17 g by mouth daily as needed. 12/07/22   [provider]  predniSONE (DELTASONE) 5 MG tablet Take 5 mg  by mouth daily.    [provider]  rosuvastatin (CRESTOR) 40 MG tablet Take 40 mg by mouth daily. 05/22/22   [provider]  sevelamer carbonate (RENVELA) 800 MG tablet Take 2,400 mg by mouth 3 (three) times daily with meals.    [provider]  SUMAtriptan (IMITREX) 50 MG tablet Take 50 mg by mouth every 2 (two) hours as needed for migraine or headache. 06/20/22   [provider]    Inpatient Medications: Scheduled Meds:  carvedilol  25 mg Oral BID WC   Chlorhexidine Gluconate Cloth  6 each Topical Q0600   FLUoxetine  20 mg Oral Daily   heparin  5,000 Units Subcutaneous Q8H   insulin aspart  0-9 Units Subcutaneous TID WC   insulin glargine  6 Units Subcutaneous Q24H   losartan  100 mg Oral Daily   sevelamer carbonate  2,400 mg Oral TID WC   sodium chloride flush  3 mL Intravenous Q12H   sodium zirconium cyclosilicate  10 g Oral TID   Continuous Infusions:  anticoagulant sodium citrate     cefTRIAXone (ROCEPHIN)  IV 2 g (07/01/23 1130)   PRN Meds: acetaminophen **OR** acetaminophen, albuterol, alteplase, anticoagulant sodium citrate, fentaNYL (SUBLIMAZE) injection, heparin, hydrALAZINE, hydrOXYzine, lidocaine (PF), lidocaine-prilocaine, pentafluoroprop-tetrafluoroeth, SUMAtriptan, trimethobenzamide  Allergies:    Allergies  Allergen Reactions   Cephalexin Anaphylaxis    Has gotten ceftriaxone in the past    Morphine Itching   Penicillins Hives and Rash   Benadryl [Diphenhydramine] Itching   Doxycycline Itching   Methotrexate Derivatives Rash    Social History:   Social History   Socioeconomic History   Marital status: Single    Spouse name: Not on file   Number of children: 0   Years of education: 11th grade   Highest education level: Not on file  Occupational History   Occupation: unemployed    Comment: has never worked  Tobacco Use   Smoking status: Never    Passive exposure: Never   Smokeless tobacco: Never  Vaping Use    Vaping status: Never Used  Substance and Sexual Activity   Alcohol use: Not Currently   Drug use: No   Sexual activity: Yes    Birth control/protection: None  Other Topics Concern   Not on file  Social History Narrative   Patient lives in [place] mother lives in [place].  Unemployed.  Previously worked for a Customer service manager.  Completed 11 grade working on [employer]. Patient 3 brothers    Social Drivers of Corporate investment banker Strain: High Risk (11/22/2022)  Received from [facility]   Overall Financial Resource Strain (CARDIA)    Difficulty of Paying Living Expenses: Hard  Food Insecurity: No Food Insecurity (06/30/2023)   Hunger Vital Sign    Worried About Running Out of Food in the Last Year: Never true    Ran Out of Food in the Last Year: Never true  Transportation Needs: No Transportation Needs (06/30/2023)   PRAPARE - Administrator, Civil Service (Medical): No    Lack of Transportation (Non-Medical): No  Physical Activity: Insufficiently Active (03/02/2022)   [facility] from [facility]   Exercise Vital Sign    Days of Exercise per Week: 2 days    Minutes of Exercise per Session: 10 min  Stress: No Stress Concern Present (03/08/2023)   Received from [facility] of Occupational [facility] - Occupational Stress Questionnaire    Feeling of Stress : Not at all  Social Connections: Moderately Integrated (06/30/2023)   Social Connection and Isolation Panel [NHANES]    Frequency of Communication with Friends and Family: More than three times a week    Frequency of Social Gatherings with Friends and Family: More than three times a week    Attends Religious Services: 1 to 4 times per year    Active Member of [employer] or Organizations: No    Attends Banker Meetings: 1 to 4 times per year    Marital Status: Never married  Intimate Partner Violence: Not At Risk (06/30/2023)   Humiliation, Afraid, Rape, and Kick  questionnaire    Fear of Current or Ex-Partner: No    Emotionally Abused: No    Physically Abused: No    Sexually Abused: No    Family History:    Family History  Problem Relation Age of Onset   Asthma Mother    Carpal tunnel syndrome Mother    Gout Father    Diabetes Paternal Grandmother    Anesthesia problems Neg Hx      ROS:  Constitutional: Denied fever, chills, malaise, night sweats Eyes: Denied vision change or loss Ears/Nose/Mouth/Throat: Denied ear ache, sore throat, coughing, sinus pain Cardiovascular: denied chest pain/pressure Respiratory:see HPI  Gastrointestinal: see HPI  Genital/Urinary: urinate 3-4 times daily  Musculoskeletal: Denied muscle ache, joint pain, weakness Skin: Denied rash, wound Neuro: Denied headache, dizziness, syncope Psych: bipolar  Endocrine: history of type 1 diabetes .     Physical Exam/Data:   Vitals:   06/30/23 2031 07/01/23 0021 07/01/23 0452 07/01/23 0844  BP: (!) 154/102 123/76 110/80 126/86  Pulse: 74 69 63 65  Resp:  18 17 16   Temp: 97.7 F (36.5 C) 98.6 F (37 C) 97.6 F (36.4 C) (!) 97.5 F (36.4 C)  TempSrc: Oral Oral Oral Oral  SpO2: 100% 99% 97% 100%  Weight:      Height:        Intake/Output Summary (Last 24 hours) at 07/01/2023 1450 Last data filed at 07/01/2023 1255 Gross per 24 hour  Intake 516.44 ml  Output --  Net 516.44 ml      06/30/2023    8:48 AM 06/17/2023    7:15 AM 06/07/2023    8:33 AM  Last 3 Weights  Weight (lbs) 133 lb 133 lb 9.6 oz 132 lb  Weight (kg) 60.328 kg 60.601 kg 59.875 kg     Body mass index is 23.56 kg/m.   Vitals:  Vitals:   07/01/23 0452 07/01/23 0844  BP: 110/80 126/86  Pulse:  63 65  Resp: 17 16  Temp: 97.6 F (36.4 C) (!) 97.5 F (36.4 C)  SpO2: 97% 100%   General Appearance: In no apparent distress, laying in bed HEENT: Normocephalic, atraumatic.  Neck: Supple, trachea midline Cardiovascular: Regular rate and rhythm, normal S1-S2,  no murmur Respiratory: Resting  breathing unlabored, lungs sounds clear to auscultation bilaterally, no use of accessory muscles. On room air.   Gastrointestinal: Bowel sounds positive, abdomen soft, RUQ + tenderness  Extremities: Able to move all extremities in bed without difficulty, no edema of BLE, RUE fistula + thrill Musculoskeletal: Normal muscle bulk and tone Skin: Intact, warm, dry. Generalized rash noted  Neurologic: Alert, oriented to person, place and time. Fluent speech, no facial droop, no cognitive deficit Psychiatric: Normal affect. Mood is appropriate.      EKG:  The EKG was personally reviewed and demonstrates:    EKG from 06/30/23 showed sinus rhythm 75bpm, anterior Q, QT manual 492 msec  Telemetry:  Telemetry was personally reviewed and demonstrates:    Sinus rhythm   Relevant CV Studies:   Echo from 09/05/22 at [facility]:   Summary   1. The left ventricle is normal in size with normal wall thickness.    2. The left ventricular systolic function is severely decreased, LVEF is  visually estimated at 35%.    3. The right ventricle is mildly dilated in size, with low normal systolic  function.   4. There is severe tricuspid regurgitation.    5. IVC size and inspiratory change suggest mildly elevated right atrial  pressure. (5-10 mmHg).     Laboratory Data:  High Sensitivity Troponin:  No results for input(s): "TROPONINIHS" in the last 720 hours.   Chemistry Recent Labs  Lab 06/30/23 1940 06/30/23 2331 07/01/23 0900  NA 136 137 134*  K 5.3* 4.9 5.5*  CL 97* 98 99  CO2 23 25 21*  GLUCOSE 193* 136* 92  BUN 40* 42* 40*  CREATININE 8.35* 8.40* 8.73*  CALCIUM 7.5* 7.5* 7.6*  GFRNONAA 6* 6* 6*  ANIONGAP 16* 14 14    Recent Labs  Lab 06/30/23 0815 07/01/23 0900  PROT 5.9* 5.8*  ALBUMIN 3.3* 3.3*  AST 36 24  ALT 36 30  ALKPHOS 220* 189*  BILITOT 1.1 0.8   Lipids No results for input(s): "CHOL", "TRIG", "HDL", "LABVLDL", "LDLCALC", "CHOLHDL" in the last 168 hours.  Hematology Recent  Labs  Lab 06/30/23 0815 06/30/23 0826 07/01/23 0900  WBC 8.6  --  8.9  RBC 4.64  --  4.89  HGB 11.9* 13.9 12.9  HCT 39.6 41.0 40.9  MCV 85.3  --  83.6  MCH 25.6*  --  26.4  MCHC 30.1  --  31.5  RDW 15.4  --  15.7*  PLT 191  --  198   Thyroid No results for input(s): "TSH", "FREET4" in the last 168 hours.  BNPNo results for input(s): "BNP", "PROBNP" in the last 168 hours.  DDimer No results for input(s): "DDIMER" in the last 168 hours.   Radiology/Studies:  US Abdomen Limited RUQ (LIVER/GB) Result Date: 06/30/2023 CLINICAL DATA:  Right upper quadrant pain EXAM: ULTRASOUND ABDOMEN LIMITED RIGHT UPPER QUADRANT COMPARISON:  06/23/2023 FINDINGS: Gallbladder: Small volume material in the dependent gallbladder without shadowing, high-density by prior CT. There is diffuse wall thickening to 6 mm but no focal tenderness or pericholecystic edema. Common bile duct: Diameter: 5 mm Liver: No focal lesion identified. Within normal limits in parenchymal echogenicity. Portal vein is patent on color  Doppler imaging with normal direction of blood flow towards the liver. Other: Small volume perihepatic ascites also seen on prior study. IMPRESSION: Cholelithiasis (better seen on prior CT) and distended, thick walled gallbladder but no sonographic Murphy sign typical of acute cholecystitis. Electronically Signed   By: Tiburcio Pea M.D.   On: 06/30/2023 12:14   DG Chest Portable 1 View Result Date: 06/30/2023 CLINICAL DATA:  Shortness of breath. EXAM: PORTABLE CHEST 1 VIEW COMPARISON:  06/23/2023 FINDINGS: Heart size is normal. Lung volumes are low. No pleural fluid, interstitial edema or airspace disease. Visualized osseous structures appear intact. IMPRESSION: Low lung volumes. No acute findings. Electronically Signed   By: Signa Kell M.D.   On: 06/30/2023 09:02     Assessment and Plan:   Pre-op risk evaluation - RCRI 3 - DASI 31.95>>6.67 METS - CHF symptoms had been stable, has poorly controlled  type 1 DM, ESRD on HD, these chronic medical conditions makes her moderate to high risk for surgical complications, although lap chole is an intermediate risk surgery, Echo has been requested for updated information on cardiomyopathy, otherwise no further cardiac workup before surgery is recommended at this time, would close monitor for volume status peri-op and use cardiac anesthesia   ADDENDUM: Echo with EF 25-30%, severe TR.  For her preop evaluation, she denies any chest pain or dyspnea currently.  Functional capacity greater than 4 METS, denies exertional symptoms.  RCRI score is 3 given history of CHF, ESRD, and T1DM.  She currently appears compensated in regards to her heart failure.  Had normal nuclear stress test 07/2022. Overall she is moderate to high risk for surgery but not prohibitive.  No further cardiac workup recommended.  Non-ischemic cardiomyopathy  Chronic systolic heart failure  - low EF dating back 2022, EF improved 2023, back down to 35% in 2024 Echo  - stress myoview at [facility] 07/2022 negative (pre-transplant) - reports SOB and peripheral swelling before hemodialysis, otherwise tolerate daily activity well with house cleaning and taking care of her toddler, no chest pain or escalating CHF symptoms  - Will update Echo  - clinically euvolemic, continue dialysis for volume management, on PTA Bumex 3mg  AM and 2mg  PM, which can be held while maintain NPO to avoid dehydration  - GDMT: on PTA coreg and losartan, would continue both, BP not high enough to support Bidil, will defer to primary cardiologist at [facility] for further changes of GDMT   QT prolongation  - noted on EKG, avoid QT prolongation meds, wean off prozac if able  - keep K >4 and Mag >2  Acute cholecystitis  Type 1 diabetes with uncontrolled hyperglycemia  ESRD HTN -per primary team      Risk Assessment/Risk Scores:    New York Heart Association (NYHA) Functional Class NYHA Class II    For questions or updates,  please contact [facility] Please consult [email or web address] for contact info under    Signed, Cyndi Bender, NP  07/01/2023 2:50 PM   Patient seen and examined.  Agree with above documentation.  [name] is a 33 year old female with a history of chronic systolic heart failure, T1DM, ESRD, hypertension, bipolar disorder who we are consulted by Dr. Jerral Ralph for preop evaluation.  She follows with cardiology at [facility].  Echo in 2022 showed EF 40%.  Echo 10/2021 showed EF had improved to 55%.  Stress Myoview 07/2022 showed normal perfusion, EF 31%.  Most recent echo 09/05/2022 showed EF 35%, severe TR.  She presented to ED yesterday with  nausea/vomiting.  Abdominal ultrasound showed cholelithiasis and distended gallbladder.  General surgery consulted, recommended cholecystectomy.  Echocardiogram today shows EF 25 to 30%, grade 2 diastolic dysfunction, mild RV dysfunction, normal PASP, mild biatrial enlargement, severe TR, RAP 8.  On exam, patient is alert and oriented, regular rate and rhythm, no murmurs, lungs CTAB, no LE edema or JVD.  For her preop evaluation, she denies any chest pain or dyspnea currently.  Functional capacity greater than 4 METS, denies exertional symptoms.  RCRI score is 3 given history of CHF, ESRD, and T1DM.  She currently appears compensated in regards to her heart failure.  Overall she is moderate to high risk but not prohibitive.  Had normal nuclear stress test 07/2022. No further cardiac workup recommended.  Little Ishikawa, MD

## 2023-07-01 NOTE — Progress Notes (Signed)
 PROGRESS NOTE    Ashley Freeman  UJW:[id number] DOB: 09/03/1990 DOA: 06/30/2023 PCP: [facility]    Brief Narrative:  33 year old with type 1 diabetes on insulin pump, chronic systolic heart failure dilated cardiomyopathy, ESRD on hemodialysis TTS, hypertension hyperlipidemia and history of C. difficile presented with body ache and abdominal pain for several days.  Seen in the emergency room on 2/23 and discharged home.  These days she is not using insulin pump.  In the emergency department patient was afebrile.  WBC 8.6.  Today lactic acid 2.8.  3.2.  Ultrasound of the gallbladder with cholelithiasis and distended thick-walled gallbladder with positive Murphy sign.  Started on fluid, IV antibiotics and admitted to the hospital with surgery consultation.  Subjective: Patient seen in the morning rounds.  She is very sleepy but looks comfortable.  Flat affect.  Difficult to participate in conversation but patient tells me her pain is better today.  Denies any nausea vomiting. Assessment & Plan:   Acute calculus cholecystitis: Clears, IV fluid, adequate pain medications, IV Rocephin.  Surgery following.  Anticipate lap chole tomorrow.  Type 1 diabetes with uncontrolled blood sugars: Probably noncompliant to medications.  Presented with blood sugars 428.  No anion gap.  Recent A1c 10.6.  Blood sugars low normal after resuming home medications.  She is n.p.o. and not having reliable intake today.  Keep on Lantus 6 units daily along with sliding scale coverage.  Will monitor.  ESRD on hemodialysis/hyperkalemia: Patient was given a dose of Lokelma.  She is due for dialysis today.  Chronic systolic heart failure, hypertension: Patient with dilated cardiomyopathy.  Currently volume status is stable.  Surgery recommended to obtain cardiology consultation for perioperative management.  Discussed with cardiology for consultation.    DVT prophylaxis: heparin injection 5,000 Units  Start: 06/30/23 1430   Code Status: Full code Family Communication: None at the bedside Disposition Plan: Status is: Inpatient Remains inpatient appropriate because: Inpatient procedures, IV antibiotics     Consultants:  Nephrology Cardiology General surgery  Procedures:  None  Antimicrobials:  Rocephin 3/2---     Objective: Vitals:   06/30/23 2031 07/01/23 0021 07/01/23 0452 07/01/23 0844  BP: (!) 154/102 123/76 110/80 126/86  Pulse: 74 69 63 65  Resp:  18 17 16   Temp: 97.7 F (36.5 C) 98.6 F (37 C) 97.6 F (36.4 C) (!) 97.5 F (36.4 C)  TempSrc: Oral Oral Oral Oral  SpO2: 100% 99% 97% 100%  Weight:      Height:        Intake/Output Summary (Last 24 hours) at 07/01/2023 1346 Last data filed at 07/01/2023 1255 Gross per 24 hour  Intake 516.44 ml  Output --  Net 516.44 ml   Filed Weights   06/30/23 0848  Weight: 60.3 kg    Examination:  General exam: Appears calm and comfortable  Sleepy.  Flat affect.  Very uninterested in conversation. Respiratory system: Clear to auscultation. Respiratory effort normal. Cardiovascular system: S1 & S2 heard, RRR.  Gastrointestinal system: Soft.  Mild tender along the epigastrium.  No rigidity or guarding.  Bowel sound present. Central nervous system: Alert and oriented. No focal neurological deficits. Extremities: Symmetric 5 x 5 power. Skin:  Right upper extremity AV fistula with thrill.    Data Reviewed: I have personally reviewed following labs and imaging studies  CBC: Recent Labs  Lab 06/30/23 0815 06/30/23 0826 07/01/23 0900  WBC 8.6  --  8.9  NEUTROABS 4.5  --   --  HGB 11.9* 13.9 12.9  HCT 39.6 41.0 40.9  MCV 85.3  --  83.6  PLT 191  --  198   Basic Metabolic Panel: Recent Labs  Lab 06/30/23 1253 06/30/23 1511 06/30/23 1940 06/30/23 2331 07/01/23 0900  NA 133* 134* 136 137 134*  K 5.4* 5.6* 5.3* 4.9 5.5*  CL 97* 97* 97* 98 99  CO2 21* 23 23 25  21*  GLUCOSE 238* 253* 193* 136* 92  BUN  37* 38* 40* 42* 40*  CREATININE 7.89* 8.16* 8.35* 8.40* 8.73*  CALCIUM 7.5* 7.3* 7.5* 7.5* 7.6*   GFR: Estimated Creatinine Clearance: 7.7 mL/min (A) (by C-G formula based on SCr of 8.73 mg/dL (H)). Liver Function Tests: Recent Labs  Lab 06/30/23 0815 07/01/23 0900  AST 36 24  ALT 36 30  ALKPHOS 220* 189*  BILITOT 1.1 0.8  PROT 5.9* 5.8*  ALBUMIN 3.3* 3.3*   Recent Labs  Lab 06/30/23 0815  LIPASE 22   No results for input(s): "AMMONIA" in the last 168 hours. Coagulation Profile: No results for input(s): "INR", "PROTIME" in the last 168 hours. Cardiac Enzymes: No results for input(s): "CKTOTAL", "CKMB", "CKMBINDEX", "TROPONINI" in the last 168 hours. BNP (last 3 results) No results for input(s): "PROBNP" in the last 8760 hours. HbA1C: No results for input(s): "HGBA1C" in the last 72 hours. CBG: Recent Labs  Lab 06/30/23 2033 07/01/23 0450 07/01/23 0605 07/01/23 0858 07/01/23 1202  GLUCAP 126* 64* 111* 99 81   Lipid Profile: No results for input(s): "CHOL", "HDL", "LDLCALC", "TRIG", "CHOLHDL", "LDLDIRECT" in the last 72 hours. Thyroid Function Tests: No results for input(s): "TSH", "T4TOTAL", "FREET4", "T3FREE", "THYROIDAB" in the last 72 hours. Anemia Panel: No results for input(s): "VITAMINB12", "FOLATE", "FERRITIN", "TIBC", "IRON", "RETICCTPCT" in the last 72 hours. Sepsis Labs: Recent Labs  Lab 06/30/23 0827 06/30/23 1102  LATICACIDVEN 2.8* 3.2*    Recent Results (from the past 240 hours)  Resp panel by RT-PCR (RSV, Flu A&B, Covid) Anterior Nasal Swab     Status: None   Collection Time: 06/23/23  1:58 PM   Specimen: Anterior Nasal Swab  Result Value Ref Range Status   SARS Coronavirus 2 by RT PCR NEGATIVE NEGATIVE Final   Influenza A by PCR NEGATIVE NEGATIVE Final   Influenza B by PCR NEGATIVE NEGATIVE Final    Comment: (NOTE) The Xpert Xpress SARS-CoV-2/FLU/RSV plus assay is intended as an aid in the diagnosis of influenza from Nasopharyngeal swab  specimens and should not be used as a sole basis for treatment. Nasal washings and aspirates are unacceptable for Xpert Xpress SARS-CoV-2/FLU/RSV testing.  Fact Sheet for Patients: BloggerCourse.com  Fact Sheet for Healthcare Providers: SeriousBroker.it  This test is not yet approved or cleared by the [place] FDA and has been authorized for detection and/or diagnosis of SARS-CoV-2 by FDA under an Emergency Use Authorization (EUA). This EUA will remain in effect (meaning this test can be used) for the duration of the COVID-19 declaration under Section 564(b)(1) of the Act, 21 U.S.C. section 360bbb-3(b)(1), unless the authorization is terminated or revoked.     Resp Syncytial Virus by PCR NEGATIVE NEGATIVE Final    Comment: (NOTE) Fact Sheet for Patients: BloggerCourse.com  Fact Sheet for Healthcare Providers: SeriousBroker.it  This test is not yet approved or cleared by the [place] FDA and has been authorized for detection and/or diagnosis of SARS-CoV-2 by FDA under an Emergency Use Authorization (EUA). This EUA will remain in effect (meaning this test can be used) for the duration  of the COVID-19 declaration under Section 564(b)(1) of the Act, 21 U.S.C. section 360bbb-3(b)(1), unless the authorization is terminated or revoked.  Performed at [facility], [address] [place], [place] [address]   Resp panel by RT-PCR (RSV, Flu A&B, Covid) Anterior Nasal Swab     Status: None   Collection Time: 06/30/23  7:56 AM   Specimen: Anterior Nasal Swab  Result Value Ref Range Status   SARS Coronavirus 2 by RT PCR NEGATIVE NEGATIVE Final   Influenza A by PCR NEGATIVE NEGATIVE Final   Influenza B by PCR NEGATIVE NEGATIVE Final    Comment: (NOTE) The Xpert Xpress SARS-CoV-2/FLU/RSV plus assay is intended as an aid in the diagnosis of influenza from Nasopharyngeal  swab specimens and should not be used as a sole basis for treatment. Nasal washings and aspirates are unacceptable for Xpert Xpress SARS-CoV-2/FLU/RSV testing.  Fact Sheet for Patients: BloggerCourse.com  Fact Sheet for Healthcare Providers: SeriousBroker.it  This test is not yet approved or cleared by the [place] FDA and has been authorized for detection and/or diagnosis of SARS-CoV-2 by FDA under an Emergency Use Authorization (EUA). This EUA will remain in effect (meaning this test can be used) for the duration of the COVID-19 declaration under Section 564(b)(1) of the Act, 21 U.S.C. section 360bbb-3(b)(1), unless the authorization is terminated or revoked.     Resp Syncytial Virus by PCR NEGATIVE NEGATIVE Final    Comment: (NOTE) Fact Sheet for Patients: BloggerCourse.com  Fact Sheet for Healthcare Providers: SeriousBroker.it  This test is not yet approved or cleared by the [place] FDA and has been authorized for detection and/or diagnosis of SARS-CoV-2 by FDA under an Emergency Use Authorization (EUA). This EUA will remain in effect (meaning this test can be used) for the duration of the COVID-19 declaration under Section 564(b)(1) of the Act, 21 U.S.C. section 360bbb-3(b)(1), unless the authorization is terminated or revoked.  Performed at [facility], [address] [place], [place] [address]   MRSA Next Gen by PCR, Nasal     Status: None   Collection Time: 06/30/23  4:55 PM  Result Value Ref Range Status   MRSA by PCR Next Gen NOT DETECTED NOT DETECTED Final    Comment: (NOTE) The GeneXpert MRSA Assay (FDA approved for NASAL specimens only), is one component of a comprehensive MRSA colonization surveillance program. It is not intended to diagnose MRSA infection nor to guide or monitor treatment for MRSA infections. Test performance is not FDA  approved in patients less than 35 years old. Performed at [facility], [address] [place], [place] [address]          Radiology Studies: US Abdomen Limited RUQ (LIVER/GB) Result Date: 06/30/2023 CLINICAL DATA:  Right upper quadrant pain EXAM: ULTRASOUND ABDOMEN LIMITED RIGHT UPPER QUADRANT COMPARISON:  06/23/2023 FINDINGS: Gallbladder: Small volume material in the dependent gallbladder without shadowing, high-density by prior CT. There is diffuse wall thickening to 6 mm but no focal tenderness or pericholecystic edema. Common bile duct: Diameter: 5 mm Liver: No focal lesion identified. Within normal limits in parenchymal echogenicity. Portal vein is patent on color Doppler imaging with normal direction of blood flow towards the liver. Other: Small volume perihepatic ascites also seen on prior study. IMPRESSION: Cholelithiasis (better seen on prior CT) and distended, thick walled gallbladder but no sonographic Murphy sign typical of acute cholecystitis. Electronically Signed   By: Tiburcio Pea M.D.   On: 06/30/2023 12:14   DG  Chest Portable 1 View Result Date: 06/30/2023 CLINICAL DATA:  Shortness of breath. EXAM: PORTABLE CHEST 1 VIEW COMPARISON:  06/23/2023 FINDINGS: Heart size is normal. Lung volumes are low. No pleural fluid, interstitial edema or airspace disease. Visualized osseous structures appear intact. IMPRESSION: Low lung volumes. No acute findings. Electronically Signed   By: Signa Kell M.D.   On: 06/30/2023 09:02        Scheduled Meds:  carvedilol  25 mg Oral BID WC   Chlorhexidine Gluconate Cloth  6 each Topical Q0600   FLUoxetine  20 mg Oral Daily   heparin  5,000 Units Subcutaneous Q8H   insulin aspart  0-9 Units Subcutaneous TID WC   insulin glargine  6 Units Subcutaneous Q24H   losartan  100 mg Oral Daily   sevelamer carbonate  2,400 mg Oral TID WC   sodium chloride flush  3 mL Intravenous Q12H   sodium zirconium cyclosilicate  10 g Oral TID    Continuous Infusions:  anticoagulant sodium citrate     cefTRIAXone (ROCEPHIN)  IV 2 g (07/01/23 1130)     LOS: 1 day    Time spent: 40 minutes    Dorcas Carrow, MD [facility]

## 2023-07-01 NOTE — Progress Notes (Addendum)
  [facility] Progress Note   Subjective:  Seen and examined at bedside, sleepy but no complaints.   Objective Vitals:   06/30/23 2031 07/01/23 0021 07/01/23 0452 07/01/23 0844  BP: (!) 154/102 123/76 110/80 126/86  Pulse: 74 69 63 65  Resp:  18 17 16   Temp: 97.7 F (36.5 C) 98.6 F (37 C) 97.6 F (36.4 C) (!) 97.5 F (36.4 C)  TempSrc: Oral Oral Oral Oral  SpO2: 100% 99% 97% 100%  Weight:      Height:          Additional Objective Labs: Basic Metabolic Panel: Recent Labs  Lab 06/30/23 1940 06/30/23 2331 07/01/23 0900  NA 136 137 134*  K 5.3* 4.9 5.5*  CL 97* 98 99  CO2 23 25 21*  GLUCOSE 193* 136* 92  BUN 40* 42* 40*  CREATININE 8.35* 8.40* 8.73*  CALCIUM 7.5* 7.5* 7.6*   CBC: Recent Labs  Lab 06/30/23 0815 06/30/23 0826 07/01/23 0900  WBC 8.6  --  8.9  NEUTROABS 4.5  --   --   HGB 11.9* 13.9 12.9  HCT 39.6 41.0 40.9  MCV 85.3  --  83.6  PLT 191  --  198   Blood Culture    Component Value Date/Time   SDES BLOOD BLOOD LEFT ARM 09/07/2022 2237   SDES BLOOD BLOOD LEFT HAND 09/07/2022 2237   SPECREQUEST IN PEDIATRIC BOTTLE Blood Culture adequate volume 09/07/2022 2237   SPECREQUEST  09/07/2022 2237    BOTTLES DRAWN AEROBIC AND ANAEROBIC Blood Culture results may not be optimal due to an inadequate volume of blood received in culture bottles   CULT  09/07/2022 2237    NO GROWTH 5 DAYS Performed at [facility], [address] [place], [place] [address]    CULT  09/07/2022 2237    NO GROWTH 5 DAYS Performed at [facility], [address] [place], [place] [address]    REPTSTATUS 09/12/2022 FINAL 09/07/2022 2237   REPTSTATUS 09/12/2022 FINAL 09/07/2022 2237     Physical Exam General: Young woman, nad Heart: RRR Lungs: Clear, normal wob Abdomen: soft non-tender Extremities: no LE edema  Dialysis Access: R AVF +bruit   Medications:  anticoagulant sodium citrate     cefTRIAXone (ROCEPHIN)  IV 2 g (07/01/23  1130)    carvedilol  25 mg Oral BID WC   Chlorhexidine Gluconate Cloth  6 each Topical Q0600   FLUoxetine  20 mg Oral Daily   heparin  5,000 Units Subcutaneous Q8H   insulin aspart  0-9 Units Subcutaneous TID WC   insulin glargine  8 Units Subcutaneous Q24H   losartan  100 mg Oral Daily   sevelamer carbonate  2,400 mg Oral TID WC   sodium chloride flush  3 mL Intravenous Q12H   sodium zirconium cyclosilicate  10 g Oral TID    Dialysis Orders:  [facility]. Will call for records.   Assessment/Plan: 1. Acute cholecystitis - on [place]. Surgery consulted - recommend cardiology clearance then plan for lap chole.  2. ESRD -  HD MWF. Continue on schedule. HD today.  3. Hyperkalemia - Lokelma ordered. Correct with HD today  4. Hyperglycemia/T1DM - per primary  5. HTN/volume-  BP/volume ok  6. Anemia- Hgb >12. No ESA needs.  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   Tomasa Blase PA-C [facility] 07/01/2023,1:45 PM

## 2023-07-01 NOTE — Inpatient Diabetes Management (Addendum)
 Inpatient Diabetes Program Recommendations  AACE/ADA: New Consensus Statement on Inpatient Glycemic Control (2015)  Target Ranges:  Prepandial:   less than 140 mg/dL      Peak postprandial:   less than 180 mg/dL (1-2 hours)      Critically ill patients:  140 - 180 mg/dL    Latest Reference Range & Units 06/30/23 08:15  Glucose 70 - 99 mg/dL 161 (H)  (H): Data is abnormally high  Latest Reference Range & Units 09/08/22 06:34 05/21/23 05:27  Hemoglobin A1C 4.8 - 5.6 % 10.4 (H) 10.6 (H)  257 mg/dl  (H): Data is abnormally high  Latest Reference Range & Units 06/30/23 08:51 06/30/23 10:52 06/30/23 16:29 06/30/23 20:33 07/01/23 04:50 07/01/23 06:05  Glucose-Capillary 70 - 99 mg/dL 096 (H)  4 units Novolog  218 (H) 246 (H)  3 units Novolog  8 units Lantus 126 (H) 64 (L) 111 (H)    Latest Reference Range & Units 07/01/23 08:58 07/01/23 12:02  Glucose-Capillary 70 - 99 mg/dL 99 81    Admit with: Suspected cholecystitis   History: Type 1 diabetes, ESRD  Home DM Meds: Tandem Tslim Insulin Pump        Dexcom G7 CGM        Novolog 4 units TID (off pump)        Lantus 8 units daily (off pump)  Current Orders: Novolog Sensitive Correction Scale/ SSI (0-9 units) TID AC     Lantus 8 units Q24H  MD- Note CBG at 12pm was 81  Please consider reducing the Lantus insulin to 6 units Q24H     Addendum 12pm--Met w/ pt at bedside.  She was lying on her side and opened her eyes briefly to me calling her name but then would NOT open her eyes again and did not seem interested in talking with me--Not sure if pt was very sleepy or just not wanting to answer questions?  I was able to get her to tell me she has not been using her insulin pump at home--not sure how long it has been since she used her insulin pump?  Could not get pt to answer any more pf my questions while I was in the room.   Pt is familiar to the Inpatient Diabetes Team due to multiple previous admissions.     ENDO: Leodis Binet Last Seen 10/02/2022 Pump Settings: Time Basal Rate (u/hr) 12:00 AM 0.500 Total Daily Basal: 12.000 u  Time Correction Factor (u:mg/dL) 04:54 AM 0:98  Time Carb Ratio (u:grams) 12:00 AM 1:12.0  Time Target BG (mg/dL) 11:91 AM 478  Insulin Duration: 5 hr     --Will follow patient during hospitalization--  Ambrose Finland RN, MSN, CDCES Diabetes Coordinator Inpatient Glycemic Control Team Team Pager: [phone] (8a-5p)

## 2023-07-01 NOTE — Plan of Care (Signed)

## 2023-07-01 NOTE — Progress Notes (Signed)
  Echocardiogram 2D Echocardiogram has been performed.  Delcie Roch 07/01/2023, 4:13 PM

## 2023-07-02 ENCOUNTER — Inpatient Hospital Stay (HOSPITAL_COMMUNITY): Admitting: Anesthesiology

## 2023-07-02 ENCOUNTER — Other Ambulatory Visit: Payer: Self-pay

## 2023-07-02 ENCOUNTER — Encounter (HOSPITAL_COMMUNITY)
Admission: EM | Disposition: A | Discharge status: Home / Self Care | Payer: Self-pay | Source: Home / Self Care | Attending: Internal Medicine

## 2023-07-02 ENCOUNTER — Encounter (HOSPITAL_COMMUNITY): Payer: Self-pay | Admitting: Internal Medicine

## 2023-07-02 DIAGNOSIS — N186 End stage renal disease: Secondary | ICD-10-CM

## 2023-07-02 DIAGNOSIS — I509 Heart failure, unspecified: Secondary | ICD-10-CM

## 2023-07-02 DIAGNOSIS — I132 Hypertensive heart and chronic kidney disease with heart failure and with stage 5 chronic kidney disease, or end stage renal disease: Secondary | ICD-10-CM | POA: Diagnosis not present

## 2023-07-02 DIAGNOSIS — K819 Cholecystitis, unspecified: Secondary | ICD-10-CM | POA: Diagnosis not present

## 2023-07-02 DIAGNOSIS — K810 Acute cholecystitis: Secondary | ICD-10-CM

## 2023-07-02 HISTORY — PX: CHOLECYSTECTOMY: SHX55

## 2023-07-02 LAB — COMPREHENSIVE METABOLIC PANEL
ALT: 30 U/L (ref 0–44)
AST: 45 U/L — ABNORMAL HIGH (ref 15–41)
Albumin: 3 g/dL — ABNORMAL LOW (ref 3.5–5.0)
Alkaline Phosphatase: 187 U/L — ABNORMAL HIGH (ref 38–126)
Anion gap: 19 — ABNORMAL HIGH (ref 5–15)
BUN: 22 mg/dL — ABNORMAL HIGH (ref 6–20)
CO2: 21 mmol/L — ABNORMAL LOW (ref 22–32)
Calcium: 7.6 mg/dL — ABNORMAL LOW (ref 8.9–10.3)
Chloride: 95 mmol/L — ABNORMAL LOW (ref 98–111)
Creatinine, Ser: 6.12 mg/dL — ABNORMAL HIGH (ref 0.44–1.00)
GFR, Estimated: 9 mL/min — ABNORMAL LOW (ref >60)
Glucose, Bld: 143 mg/dL — ABNORMAL HIGH (ref 70–99)
Potassium: 3.7 mmol/L (ref 3.5–5.1)
Sodium: 135 mmol/L (ref 135–145)
Total Bilirubin: 1.1 mg/dL (ref 0.0–1.2)
Total Protein: 5.8 g/dL — ABNORMAL LOW (ref 6.5–8.1)

## 2023-07-02 LAB — GLUCOSE, CAPILLARY
Glucose-Capillary: 110 mg/dL — ABNORMAL HIGH (ref 70–99)
Glucose-Capillary: 63 mg/dL — ABNORMAL LOW (ref 70–99)
Glucose-Capillary: 150 mg/dL — ABNORMAL HIGH (ref 70–99)
Glucose-Capillary: 42 mg/dL — CL (ref 70–99)
Glucose-Capillary: 105 mg/dL — ABNORMAL HIGH (ref 70–99)
Glucose-Capillary: 53 mg/dL — ABNORMAL LOW (ref 70–99)
Glucose-Capillary: 103 mg/dL — ABNORMAL HIGH (ref 70–99)
Glucose-Capillary: 97 mg/dL (ref 70–99)

## 2023-07-02 LAB — POCT I-STAT, CHEM 8
BUN: 21 mg/dL — ABNORMAL HIGH (ref 6–20)
Calcium, Ion: 0.91 mmol/L — ABNORMAL LOW (ref 1.15–1.40)
Chloride: 98 mmol/L (ref 98–111)
Creatinine, Ser: 5.8 mg/dL — ABNORMAL HIGH (ref 0.44–1.00)
Glucose, Bld: 100 mg/dL — ABNORMAL HIGH (ref 70–99)
HCT: 43 % (ref 36.0–46.0)
Hemoglobin: 14.6 g/dL (ref 12.0–15.0)
Potassium: 3.3 mmol/L — ABNORMAL LOW (ref 3.5–5.1)
Sodium: 136 mmol/L (ref 135–145)
TCO2: 25 mmol/L (ref 22–32)

## 2023-07-02 LAB — CBC
HCT: 42.2 % (ref 36.0–46.0)
Hemoglobin: 13 g/dL (ref 12.0–15.0)
MCH: 25.9 pg — ABNORMAL LOW (ref 26.0–34.0)
MCHC: 30.8 g/dL (ref 30.0–36.0)
MCV: 84.2 fL (ref 80.0–100.0)
Platelets: 154 10*3/uL (ref 150–400)
RBC: 5.01 MIL/uL (ref 3.87–5.11)
RDW: 16.2 % — ABNORMAL HIGH (ref 11.5–15.5)
WBC: 7.1 10*3/uL (ref 4.0–10.5)
nRBC: 0 % (ref 0.0–0.2)

## 2023-07-02 LAB — HEPATITIS B SURFACE ANTIBODY, QUANTITATIVE: Hep B S AB Quant (Post): 4.6 m[IU]/mL — ABNORMAL LOW

## 2023-07-02 SURGERY — LAPAROSCOPIC CHOLECYSTECTOMY
Anesthesia: General | Site: Abdomen

## 2023-07-02 MED ORDER — VASOPRESSIN 20 UNIT/ML IV SOLN
INTRAVENOUS | Status: AC
Start: 2023-07-02 — End: ?
  Filled 2023-07-02: qty 1

## 2023-07-02 MED ORDER — METHOCARBAMOL 500 MG PO TABS
500.0000 mg | ORAL_TABLET | Freq: Four times a day (QID) | ORAL | Status: DC | PRN
  Administered 2023-07-04: 500 mg via ORAL
  Filled 2023-07-02: qty 1

## 2023-07-02 MED ORDER — ORAL CARE MOUTH RINSE: 15.0000 mL | Freq: Once | OROMUCOSAL | Status: DC

## 2023-07-02 MED ORDER — ONDANSETRON HCL 4 MG/2ML IJ SOLN
INTRAMUSCULAR | Status: AC
  Filled 2023-07-02: qty 2

## 2023-07-02 MED ORDER — ORAL CARE MOUTH RINSE: 15.0000 mL | Freq: Once | OROMUCOSAL | Status: AC

## 2023-07-02 MED ORDER — BUPIVACAINE-EPINEPHRINE 0.25% -1:200000 IJ SOLN
INTRAMUSCULAR | Status: DC | PRN
  Administered 2023-07-02: 6 mL

## 2023-07-02 MED ORDER — LIDOCAINE 2% (20 MG/ML) 5 ML SYRINGE
INTRAMUSCULAR | Status: DC | PRN
  Administered 2023-07-02: 40 mg via INTRAVENOUS

## 2023-07-02 MED ORDER — MIDAZOLAM HCL 2 MG/2ML IJ SOLN
INTRAMUSCULAR | Status: AC
  Filled 2023-07-02: qty 2

## 2023-07-02 MED ORDER — SODIUM CHLORIDE 0.9% FLUSH: 3.0000 mL | INTRAVENOUS | Status: DC | PRN

## 2023-07-02 MED ORDER — DEXTROSE 50 % IV SOLN
INTRAVENOUS | Status: AC
  Administered 2023-07-02: 50 mL
  Filled 2023-07-02: qty 50

## 2023-07-02 MED ORDER — 0.9 % SODIUM CHLORIDE (POUR BTL) OPTIME
TOPICAL | Status: DC | PRN
  Administered 2023-07-02: 1000 mL

## 2023-07-02 MED ORDER — INDOCYANINE GREEN 25 MG IV SOLR
INTRAVENOUS | Status: DC | PRN
  Administered 2023-07-02: 1.25 mg via INTRAVENOUS

## 2023-07-02 MED ORDER — EPHEDRINE 5 MG/ML INJ
INTRAVENOUS | Status: AC
  Filled 2023-07-02: qty 5

## 2023-07-02 MED ORDER — DEXTROSE 50 % IV SOLN
INTRAVENOUS | Status: DC | PRN
Start: 2023-07-02 — End: 2023-07-02
  Administered 2023-07-02: 25 mL via INTRAVENOUS

## 2023-07-02 MED ORDER — CHLORHEXIDINE GLUCONATE 0.12 % MT SOLN
15.0000 mL | Freq: Once | OROMUCOSAL | Status: AC
  Administered 2023-07-02: 15 mL via OROMUCOSAL

## 2023-07-02 MED ORDER — SODIUM CHLORIDE 0.9% FLUSH: 3.0000 mL | Freq: Two times a day (BID) | INTRAVENOUS | Status: DC

## 2023-07-02 MED ORDER — CARVEDILOL 12.5 MG PO TABS
ORAL_TABLET | ORAL | Status: AC
Start: 2023-07-02 — End: 2023-07-03
  Filled 2023-07-02: qty 2

## 2023-07-02 MED ORDER — CHLORHEXIDINE GLUCONATE 0.12 % MT SOLN: 15.0000 mL | Freq: Once | OROMUCOSAL | Status: DC

## 2023-07-02 MED ORDER — MENTHOL 3 MG MT LOZG
1.0000 | LOZENGE | OROMUCOSAL | Status: DC | PRN
  Administered 2023-07-02: 3 mg via ORAL
  Filled 2023-07-02 (×2): qty 9

## 2023-07-02 MED ORDER — SODIUM CHLORIDE 0.9 % IV SOLN: INTRAVENOUS | Status: DC

## 2023-07-02 MED ORDER — ONDANSETRON HCL 4 MG/2ML IJ SOLN
INTRAMUSCULAR | Status: DC | PRN
  Administered 2023-07-02: 4 mg via INTRAVENOUS

## 2023-07-02 MED ORDER — SUGAMMADEX SODIUM 200 MG/2ML IV SOLN
INTRAVENOUS | Status: DC | PRN
  Administered 2023-07-02: 200 mg via INTRAVENOUS

## 2023-07-02 MED ORDER — ACETAMINOPHEN 500 MG PO TABS
1000.0000 mg | ORAL_TABLET | Freq: Once | ORAL | Status: AC
  Administered 2023-07-02: 1000 mg via ORAL
  Filled 2023-07-02: qty 2

## 2023-07-02 MED ORDER — INDOCYANINE GREEN 25 MG IV SOLR
1.2500 mg | INTRAVENOUS | Status: AC | PRN
  Administered 2023-07-02: 1.25 mg via INTRAVENOUS
  Filled 2023-07-02: qty 10

## 2023-07-02 MED ORDER — LIDOCAINE 2% (20 MG/ML) 5 ML SYRINGE
INTRAMUSCULAR | Status: AC
  Filled 2023-07-02: qty 5

## 2023-07-02 MED ORDER — OXYCODONE HCL 5 MG/5ML PO SOLN: 5.0000 mg | Freq: Once | ORAL | Status: DC | PRN

## 2023-07-02 MED ORDER — BUPIVACAINE-EPINEPHRINE (PF) 0.25% -1:200000 IJ SOLN
INTRAMUSCULAR | Status: AC
  Filled 2023-07-02: qty 30

## 2023-07-02 MED ORDER — OXYCODONE HCL 5 MG PO TABS
5.0000 mg | ORAL_TABLET | ORAL | Status: DC | PRN
Start: 2023-07-02 — End: 2023-07-03
  Administered 2023-07-02: 10 mg via ORAL
  Filled 2023-07-02: qty 2

## 2023-07-02 MED ORDER — MIDAZOLAM HCL 2 MG/2ML IJ SOLN
INTRAMUSCULAR | Status: DC | PRN
  Administered 2023-07-02: 2 mg via INTRAVENOUS

## 2023-07-02 MED ORDER — PENTAFLUOROPROP-TETRAFLUOROETH EX AERO: 1.0000 | INHALATION_SPRAY | CUTANEOUS | Status: DC | PRN

## 2023-07-02 MED ORDER — HEMOSTATIC AGENTS (NO CHARGE) OPTIME
TOPICAL | Status: DC | PRN
  Administered 2023-07-02: 1 via TOPICAL

## 2023-07-02 MED ORDER — SODIUM CHLORIDE 0.9 % IR SOLN
Status: DC | PRN
  Administered 2023-07-02: 1000 mL

## 2023-07-02 MED ORDER — FENTANYL CITRATE (PF) 250 MCG/5ML IJ SOLN
INTRAMUSCULAR | Status: DC | PRN
  Administered 2023-07-02: 100 ug via INTRAVENOUS
  Administered 2023-07-02: 50 ug via INTRAVENOUS

## 2023-07-02 MED ORDER — DEXTROSE 50 % IV SOLN
INTRAVENOUS | Status: AC
  Filled 2023-07-02: qty 50

## 2023-07-02 MED ORDER — PROPOFOL 10 MG/ML IV BOLUS
INTRAVENOUS | Status: DC | PRN
  Administered 2023-07-02: 30 mg via INTRAVENOUS
  Administered 2023-07-02: 50 mg via INTRAVENOUS

## 2023-07-02 MED ORDER — ACETAMINOPHEN 500 MG PO TABS
1000.0000 mg | ORAL_TABLET | Freq: Four times a day (QID) | ORAL | Status: DC
  Administered 2023-07-02 – 2023-07-03 (×3): 1000 mg via ORAL
  Filled 2023-07-02 (×3): qty 2

## 2023-07-02 MED ORDER — FENTANYL CITRATE (PF) 100 MCG/2ML IJ SOLN: 25.0000 ug | INTRAMUSCULAR | Status: DC | PRN

## 2023-07-02 MED ORDER — INSULIN ASPART 100 UNIT/ML IJ SOLN
0.0000 [IU] | Freq: Three times a day (TID) | INTRAMUSCULAR | Status: DC
  Administered 2023-07-03 – 2023-07-04 (×3): 4 [IU] via SUBCUTANEOUS
  Administered 2023-07-04: 6 [IU] via SUBCUTANEOUS
  Administered 2023-07-05: 1 [IU] via SUBCUTANEOUS
  Administered 2023-07-05: 2 [IU] via SUBCUTANEOUS
  Administered 2023-07-05: 3 [IU] via SUBCUTANEOUS
  Administered 2023-07-06: 6 [IU] via SUBCUTANEOUS

## 2023-07-02 MED ORDER — OXYCODONE HCL 5 MG PO TABS: 5.0000 mg | ORAL_TABLET | Freq: Once | ORAL | Status: DC | PRN

## 2023-07-02 MED ORDER — INSULIN GLARGINE 100 UNIT/ML ~~LOC~~ SOLN
4.0000 [IU] | Freq: Every day | SUBCUTANEOUS | Status: DC
  Filled 2023-07-02 (×2): qty 0.04

## 2023-07-02 MED ORDER — HEPARIN SODIUM (PORCINE) 1000 UNIT/ML DIALYSIS: 1000.0000 [IU] | INTRAMUSCULAR | Status: DC | PRN

## 2023-07-02 MED ORDER — ROCURONIUM BROMIDE 10 MG/ML (PF) SYRINGE
PREFILLED_SYRINGE | INTRAVENOUS | Status: DC | PRN
  Administered 2023-07-02: 50 mg via INTRAVENOUS

## 2023-07-02 MED ORDER — LIDOCAINE-PRILOCAINE 2.5-2.5 % EX CREA: 1.0000 | TOPICAL_CREAM | CUTANEOUS | Status: DC | PRN

## 2023-07-02 MED ORDER — FENTANYL CITRATE (PF) 250 MCG/5ML IJ SOLN
INTRAMUSCULAR | Status: AC
  Filled 2023-07-02: qty 5

## 2023-07-02 MED ORDER — DEXTROSE 50 % IV SOLN
12.5000 g | INTRAVENOUS | Status: AC
  Administered 2023-07-02: 12.5 g via INTRAVENOUS
  Filled 2023-07-02: qty 50

## 2023-07-02 MED ORDER — SUGAMMADEX SODIUM 200 MG/2ML IV SOLN
INTRAVENOUS | Status: AC
  Filled 2023-07-02: qty 2

## 2023-07-02 SURGICAL SUPPLY — 38 items
APPLIER CLIP 5 13 M/L LIGAMAX5 (MISCELLANEOUS) ×1 IMPLANT
BAG COUNTER SPONGE SURGICOUNT (BAG) ×1 IMPLANT
BLADE CLIPPER SURG (BLADE) IMPLANT
CANISTER SUCT 3000ML PPV (MISCELLANEOUS) ×1 IMPLANT
CHLORAPREP W/TINT 26 (MISCELLANEOUS) ×1 IMPLANT
CLIP APPLIE 5 13 M/L LIGAMAX5 (MISCELLANEOUS) ×1 IMPLANT
COVER SURGICAL LIGHT HANDLE (MISCELLANEOUS) ×1 IMPLANT
DERMABOND ADVANCED .7 DNX12 (GAUZE/BANDAGES/DRESSINGS) ×1 IMPLANT
DERMABOND ADVANCED .7 DNX6 (GAUZE/BANDAGES/DRESSINGS) IMPLANT
ELECT REM PT RETURN 9FT ADLT (ELECTROSURGICAL) ×1 IMPLANT
ELECTRODE REM PT RTRN 9FT ADLT (ELECTROSURGICAL) ×1 IMPLANT
GLOVE BIO SURGEON STRL SZ7 (GLOVE) ×1 IMPLANT
GLOVE BIOGEL PI IND STRL 7.5 (GLOVE) ×1 IMPLANT
GOWN STRL REUS W/ TWL LRG LVL3 (GOWN DISPOSABLE) ×3 IMPLANT
GRASPER SUT TROCAR 14GX15 (MISCELLANEOUS) ×1 IMPLANT
HEMOSTAT SNOW SURGICEL 2X4 (HEMOSTASIS) IMPLANT
IRRIG SUCT STRYKERFLOW 2 WTIP (MISCELLANEOUS) ×1 IMPLANT
IRRIGATION SUCT STRKRFLW 2 WTP (MISCELLANEOUS) ×1 IMPLANT
KIT BASIN OR (CUSTOM PROCEDURE TRAY) ×1 IMPLANT
KIT IMAGING PINPOINTPAQ (MISCELLANEOUS) IMPLANT
KIT TURNOVER KIT B (KITS) ×1 IMPLANT
NS IRRIG 1000ML POUR BTL (IV SOLUTION) ×1 IMPLANT
PAD ARMBOARD 7.5X6 YLW CONV (MISCELLANEOUS) ×1 IMPLANT
POUCH RETRIEVAL ECOSAC 10 (ENDOMECHANICALS) ×1 IMPLANT
SCISSORS LAP 5X35 DISP (ENDOMECHANICALS) ×1 IMPLANT
SET TUBE SMOKE EVAC HIGH FLOW (TUBING) ×1 IMPLANT
SLEEVE Z-THREAD 5X100MM (TROCAR) ×2 IMPLANT
SPECIMEN JAR SMALL (MISCELLANEOUS) ×1 IMPLANT
STRIP CLOSURE SKIN 1/2X4 (GAUZE/BANDAGES/DRESSINGS) ×1 IMPLANT
SUT MNCRL AB 4-0 PS2 18 (SUTURE) ×1 IMPLANT
SUT VICRYL 0 UR6 27IN ABS (SUTURE) ×1 IMPLANT
TOWEL GREEN STERILE (TOWEL DISPOSABLE) ×1 IMPLANT
TOWEL GREEN STERILE FF (TOWEL DISPOSABLE) ×1 IMPLANT
TRAY LAPAROSCOPIC MC (CUSTOM PROCEDURE TRAY) ×1 IMPLANT
TROCAR BALLN 12MMX100 BLUNT (TROCAR) ×1 IMPLANT
TROCAR Z-THREAD OPTICAL 5X100M (TROCAR) ×1 IMPLANT
WARMER LAPAROSCOPE (MISCELLANEOUS) ×1 IMPLANT
WATER STERILE IRR 1000ML POUR (IV SOLUTION) ×1 IMPLANT

## 2023-07-02 NOTE — Transfer of Care (Signed)
 Immediate Anesthesia Transfer of Care Note  Patient: Ashley Freeman  Procedure(s) Performed: LAPAROSCOPIC CHOLECYSTECTOMY (Abdomen) INDOCYANINE GREEN FLUORESCENCE IMAGING (ICG) (Abdomen)  Patient Location: PACU  Anesthesia Type:General  Level of Consciousness: awake, alert , and oriented  Airway & Oxygen Therapy: Patient connected to face mask oxygen  Post-op Assessment: Report given to RN and Post -op Vital signs reviewed and stable  Post vital signs: Reviewed and stable  Last Vitals:  Vitals Value Taken Time  BP 121/97 07/02/23 1518  Temp    Pulse 79 07/02/23 1522  Resp 15 07/02/23 1522  SpO2 98 % 07/02/23 1522  Vitals shown include unfiled device data.  Last Pain:  Vitals:   07/02/23 1257  TempSrc:   PainSc: 0-No pain         Complications: There were no known notable events for this encounter.

## 2023-07-02 NOTE — Progress Notes (Signed)
 Heart Failure Navigator Progress Note  Assessed for Heart & Vascular TOC clinic readiness.  Patient does not meet criteria due to ESRD on hemodialysis.   Navigator will sign off at this time.   Rhae Hammock, BSN, Scientist, clinical (histocompatibility and immunogenetics) Only

## 2023-07-02 NOTE — Anesthesia Procedure Notes (Signed)
 Procedure Name: Intubation Date/Time: 07/02/2023 1:43 PM  Performed by: Sharyn Dross, CRNAPre-anesthesia Checklist: Patient identified, Emergency Drugs available, Suction available and Patient being monitored Patient Re-evaluated:Patient Re-evaluated prior to induction Oxygen Delivery Method: Circle system utilized Preoxygenation: Pre-oxygenation with 100% oxygen Induction Type: IV induction Ventilation: Mask ventilation without difficulty Laryngoscope Size: Mac and 4 Grade View: Grade I Tube type: Oral Tube size: 7.0 mm Number of attempts: 1 Airway Equipment and Method: Stylet and Oral airway Placement Confirmation: ETT inserted through vocal cords under direct vision, positive ETCO2 and breath sounds checked- equal and bilateral Secured at: 22 cm Tube secured with: Tape Dental Injury: Teeth and Oropharynx as per pre-operative assessment

## 2023-07-02 NOTE — Plan of Care (Signed)

## 2023-07-02 NOTE — Op Note (Signed)
 Preoperative diagnosis: acute cholecystitis Postoperative diagnosis: same as above Procedure:Laparoscopic cholecystectomy, laparoscopic lysis of adhesions Surgeon: Dr. Harden Mo Estimated blood loss: Less than 50 cc Specimens: Gallbladder and contents to pathology Complications: None Drains: None Anesthesia: General Sponge needle count correct at completion Disposition to recovery stable addition   Indications: 33 yof ESRD, DM with acute cholecystitis. We discussed proceeding with lap chole.    Procedure: After informed consent was obtained she was taken to the operating room.  She was already on antibiotics.  She was given Ancef immediately preoperatively.  She had SCDs in place.  She was placed under general anesthesia without complication.  She was prepped and draped in a standard sterile surgical fashion.  Surgical timeout was then performed.   I infiltrated Marcaine below the umbilicus and made a vertical incision.  This was difficult to to some old scar.  This was done without injury.  I then inserted a Hassan trocar after placing a 0 Vicryl pursestring suture.  The abdomen was insufflated 15 mmHg pressure.  I had placed two right sided trocars and then had some adhesions from her omentum to her abdominal wall as well as to her falciform ligament that I took down.  I then placed 2 additional trocars in the epigastrium and right upper quadrant.  The gallbladder was then retracted cephalad and lateral. This was difficult to due her prior PD.  She had evidence of acute and chronic cholecystitis.  I was able to dissect and get a critical view of safety. I took the gallbladder top down also to ensure what I was looking at. .  I then clipped the duct 3 times and divided it.  The duct was viable and the the clips completely traversed the duct.  I also clipped the artery in a similar fashion. The gallbladder was then removed from the liver bed without difficulty. I left a small portion of the  gallbladder on the liver that I cauterized.  This was placed in retrieval bag and removed from the umbilical incision.  Hemostasis was observed.  This will area had some mild oozing to it so I did place some Surgicel snow in the gallbladder fossa.  I then removed the [facility] trocar and tied my pursestring down.  I placed several other 0 Vicryl sutures with using the suture passer device to completely obliterate this defect.  I then remove the remaining trocars and desufflated the abdomen.  These were closed with 4-0 Monocryl and glue.  She tolerated this well was extubated and transferred recovery stable.

## 2023-07-02 NOTE — Anesthesia Postprocedure Evaluation (Signed)
 Anesthesia Post Note  Patient: Ashley Freeman  Procedure(s) Performed: LAPAROSCOPIC CHOLECYSTECTOMY (Abdomen) INDOCYANINE GREEN FLUORESCENCE IMAGING (ICG) (Abdomen)     Patient location during evaluation: PACU Anesthesia Type: General Level of consciousness: awake and alert Pain management: pain level controlled Vital Signs Assessment: post-procedure vital signs reviewed and stable Respiratory status: spontaneous breathing, nonlabored ventilation and respiratory function stable Cardiovascular status: stable and blood pressure returned to baseline Anesthetic complications: no   There were no known notable events for this encounter.  Last Vitals:  Vitals:   07/02/23 1547 07/02/23 1626  BP: (!) 158/85 (!) 160/88  Pulse: 77 76  Resp: 16 17  Temp:  36.9 C  SpO2: 97% 97%                    Beryle Lathe

## 2023-07-02 NOTE — Progress Notes (Signed)
 PROGRESS NOTE    Ashley Freeman  EXB:[id number] DOB: 1991/02/06 DOA: 06/30/2023 PCP: [facility]    Brief Narrative:  33 year old with type 1 diabetes on insulin pump, chronic systolic heart failure dilated cardiomyopathy, ESRD on hemodialysis TTS, hypertension hyperlipidemia and history of C. difficile presented with body ache and abdominal pain for several days.  Seen in the emergency room on 2/23 and discharged home.  These days she is not using insulin pump.  In the emergency department patient was afebrile.  WBC 8.6.  Today lactic acid 2.8.  3.2.  Ultrasound of the gallbladder with cholelithiasis and distended thick-walled gallbladder with positive Murphy sign.  Started on fluid, IV antibiotics and admitted to the hospital with surgery consultation.  Subjective:  I saw patient receiving hemodialysis.  Patient was more interactive today.  She says she has mild pain consistent.  Denies any nausea vomiting. Reported early morning hypoglycemia, she had poor intake yesterday and had to receive long-acting insulin because of being type 1 diabetes.  She was corrected.  She is aware about going to surgery after dialysis today.   Assessment & Plan:   Acute calculus cholecystitis: IV fluid, adequate pain medications, IV Rocephin.  Surgery following.  Anticipating lap chole today after hemodialysis.  Type 1 diabetes with uncontrolled blood sugars: Probably noncompliant to medications.  Presented with blood sugars 428.  No anion gap.  Recent A1c 10.6.  Blood sugars low after resuming home medications.  She is n.p.o. and not having reliable intake today.  Keep on Lantus 6 units daily along with sliding scale coverage.  Will monitor.  ESRD on hemodialysis/hyperkalemia: Patient was given a dose of Lokelma.  Receiving dialysis today.  Chronic systolic heart failure, hypertension: Patient with dilated cardiomyopathy.  Currently volume status is stable.   Patient previously with  reduced ejection fraction, improved in the past.  Repeat echocardiogram was done 3/3 that showed ejection fraction of 25 to 30%.  Patient currently euvolemic after dialysis.  Seen by cardiology for preoperative assessment.  Patient was deemed acceptable to take risk due to having acute cholecystitis needing surgical removal.    DVT prophylaxis: heparin injection 5,000 Units Start: 06/30/23 1430   Code Status: Full code Family Communication: None at the bedside Disposition Plan: Status is: Inpatient Remains inpatient appropriate because: Inpatient procedures, IV antibiotics     Consultants:  Nephrology Cardiology General surgery  Procedures:  None  Antimicrobials:  Rocephin 3/2---     Objective: Vitals:   07/02/23 1130 07/02/23 1154 07/02/23 1157 07/02/23 1242  BP: (!) 144/97 (!) 172/90 (!) 159/88 (!) (P) 182/92  Pulse: 77 83 86 (P) 81  Resp: 17 19 (!) 22 (P) 20  Temp:  98.4 F (36.9 C)  (P) 97.9 F (36.6 C)  TempSrc:    (P) Oral  SpO2: 100% 100% 100% (P) 95%  Weight:    (P) 60.3 kg  Height:    (P) 5\' 3"  (1.6 m)    Intake/Output Summary (Last 24 hours) at 07/02/2023 1304 Last data filed at 07/02/2023 1157 Gross per 24 hour  Intake 350 ml  Output 2001 ml  Net -1651 ml   Filed Weights   06/30/23 0848 07/02/23 1242  Weight: 60.3 kg (P) 60.3 kg    Examination:  General exam: Appears fairly comfortable today.  Receiving dialysis.  On room air. Respiratory system: Clear to auscultation. Respiratory effort normal. Cardiovascular system: S1 & S2 heard, RRR.  Gastrointestinal system: Soft.  Mild tender along the epigastrium.  No  rigidity or guarding.  Bowel sound present. Central nervous system: Alert and oriented. No focal neurological deficits. Extremities: Symmetric 5 x 5 power. Skin:  Right upper extremity AV fistula running HD.    Data Reviewed: I have personally reviewed following labs and imaging studies  CBC: Recent Labs  Lab 06/30/23 0815  06/30/23 0826 07/01/23 0900 07/02/23 0735  WBC 8.6  --  8.9 7.1  NEUTROABS 4.5  --   --   --   HGB 11.9* 13.9 12.9 13.0  HCT 39.6 41.0 40.9 42.2  MCV 85.3  --  83.6 84.2  PLT 191  --  198 154   Basic Metabolic Panel: Recent Labs  Lab 06/30/23 1253 06/30/23 1511 06/30/23 1940 06/30/23 2331 07/01/23 0900  NA 133* 134* 136 137 134*  K 5.4* 5.6* 5.3* 4.9 5.5*  CL 97* 97* 97* 98 99  CO2 21* 23 23 25  21*  GLUCOSE 238* 253* 193* 136* 92  BUN 37* 38* 40* 42* 40*  CREATININE 7.89* 8.16* 8.35* 8.40* 8.73*  CALCIUM 7.5* 7.3* 7.5* 7.5* 7.6*   GFR: Estimated Creatinine Clearance: 7.7 mL/min (A) (by C-G formula based on SCr of 8.73 mg/dL (H)). Liver Function Tests: Recent Labs  Lab 06/30/23 0815 07/01/23 0900  AST 36 24  ALT 36 30  ALKPHOS 220* 189*  BILITOT 1.1 0.8  PROT 5.9* 5.8*  ALBUMIN 3.3* 3.3*   Recent Labs  Lab 06/30/23 0815  LIPASE 22   No results for input(s): "AMMONIA" in the last 168 hours. Coagulation Profile: No results for input(s): "INR", "PROTIME" in the last 168 hours. Cardiac Enzymes: No results for input(s): "CKTOTAL", "CKMB", "CKMBINDEX", "TROPONINI" in the last 168 hours. BNP (last 3 results) No results for input(s): "PROBNP" in the last 8760 hours. HbA1C: No results for input(s): "HGBA1C" in the last 72 hours. CBG: Recent Labs  Lab 07/02/23 0139 07/02/23 0223 07/02/23 0545 07/02/23 0629 07/02/23 1241  GLUCAP 63* 110* 42* 150* 105*   Lipid Profile: No results for input(s): "CHOL", "HDL", "LDLCALC", "TRIG", "CHOLHDL", "LDLDIRECT" in the last 72 hours. Thyroid Function Tests: No results for input(s): "TSH", "T4TOTAL", "FREET4", "T3FREE", "THYROIDAB" in the last 72 hours. Anemia Panel: No results for input(s): "VITAMINB12", "FOLATE", "FERRITIN", "TIBC", "IRON", "RETICCTPCT" in the last 72 hours. Sepsis Labs: Recent Labs  Lab 06/30/23 0827 06/30/23 1102  LATICACIDVEN 2.8* 3.2*    Recent Results (from the past 240 hours)  Resp panel  by RT-PCR (RSV, Flu A&B, Covid) Anterior Nasal Swab     Status: None   Collection Time: 06/23/23  1:58 PM   Specimen: Anterior Nasal Swab  Result Value Ref Range Status   SARS Coronavirus 2 by RT PCR NEGATIVE NEGATIVE Final   Influenza A by PCR NEGATIVE NEGATIVE Final   Influenza B by PCR NEGATIVE NEGATIVE Final    Comment: (NOTE) The Xpert Xpress SARS-CoV-2/FLU/RSV plus assay is intended as an aid in the diagnosis of influenza from Nasopharyngeal swab specimens and should not be used as a sole basis for treatment. Nasal washings and aspirates are unacceptable for Xpert Xpress SARS-CoV-2/FLU/RSV testing.  Fact Sheet for Patients: BloggerCourse.com  Fact Sheet for Healthcare Providers: SeriousBroker.it  This test is not yet approved or cleared by the [place] FDA and has been authorized for detection and/or diagnosis of SARS-CoV-2 by FDA under an Emergency Use Authorization (EUA). This EUA will remain in effect (meaning this test can be used) for the duration of the COVID-19 declaration under Section 564(b)(1) of the Act, 21 U.S.C.  section 360bbb-3(b)(1), unless the authorization is terminated or revoked.     Resp Syncytial Virus by PCR NEGATIVE NEGATIVE Final    Comment: (NOTE) Fact Sheet for Patients: BloggerCourse.com  Fact Sheet for Healthcare Providers: SeriousBroker.it  This test is not yet approved or cleared by the [place] FDA and has been authorized for detection and/or diagnosis of SARS-CoV-2 by FDA under an Emergency Use Authorization (EUA). This EUA will remain in effect (meaning this test can be used) for the duration of the COVID-19 declaration under Section 564(b)(1) of the Act, 21 U.S.C. section 360bbb-3(b)(1), unless the authorization is terminated or revoked.  Performed at [facility], [address], [place], [place] [address]   Resp  panel by RT-PCR (RSV, Flu A&B, Covid) Anterior Nasal Swab     Status: None   Collection Time: 06/30/23  7:56 AM   Specimen: Anterior Nasal Swab  Result Value Ref Range Status   SARS Coronavirus 2 by RT PCR NEGATIVE NEGATIVE Final   Influenza A by PCR NEGATIVE NEGATIVE Final   Influenza B by PCR NEGATIVE NEGATIVE Final    Comment: (NOTE) The Xpert Xpress SARS-CoV-2/FLU/RSV plus assay is intended as an aid in the diagnosis of influenza from Nasopharyngeal swab specimens and should not be used as a sole basis for treatment. Nasal washings and aspirates are unacceptable for Xpert Xpress SARS-CoV-2/FLU/RSV testing.  Fact Sheet for Patients: BloggerCourse.com  Fact Sheet for Healthcare Providers: SeriousBroker.it  This test is not yet approved or cleared by the [place] FDA and has been authorized for detection and/or diagnosis of SARS-CoV-2 by FDA under an Emergency Use Authorization (EUA). This EUA will remain in effect (meaning this test can be used) for the duration of the COVID-19 declaration under Section 564(b)(1) of the Act, 21 U.S.C. section 360bbb-3(b)(1), unless the authorization is terminated or revoked.     Resp Syncytial Virus by PCR NEGATIVE NEGATIVE Final    Comment: (NOTE) Fact Sheet for Patients: BloggerCourse.com  Fact Sheet for Healthcare Providers: SeriousBroker.it  This test is not yet approved or cleared by the [place] FDA and has been authorized for detection and/or diagnosis of SARS-CoV-2 by FDA under an Emergency Use Authorization (EUA). This EUA will remain in effect (meaning this test can be used) for the duration of the COVID-19 declaration under Section 564(b)(1) of the Act, 21 U.S.C. section 360bbb-3(b)(1), unless the authorization is terminated or revoked.  Performed at [facility], [address], [place], [place] [address]    MRSA Next Gen by PCR, Nasal     Status: None   Collection Time: 06/30/23  4:55 PM  Result Value Ref Range Status   MRSA by PCR Next Gen NOT DETECTED NOT DETECTED Final    Comment: (NOTE) The GeneXpert MRSA Assay (FDA approved for NASAL specimens only), is one component of a comprehensive MRSA colonization surveillance program. It is not intended to diagnose MRSA infection nor to guide or monitor treatment for MRSA infections. Test performance is not FDA approved in patients less than 55 years old. Performed at [facility], [address] [place], [place] [address]          Radiology Studies: ECHOCARDIOGRAM COMPLETE Result Date: 07/01/2023    ECHOCARDIOGRAM REPORT   Patient Name:   PETINA MURASKI [facility] Date of Exam: 07/01/2023 Medical Rec #:  [id number]                    Height:  63.0 in Accession #:    [id number]                   Weight:       133.0 lb Date of Birth:  07-14-1990                     BSA:          1.626 m Patient Age:    33 years                     BP:           126/86 mmHg Patient Gender: F                            HR:           66 bpm. Exam Location:  Inpatient Procedure: 2D Echo (Both Spectral and Color Flow Doppler were utilized during            procedure). Indications:    acute systolic chf  History:        Patient has prior history of Echocardiogram examinations, most                 recent 06/10/2020. End stage renal disease; Risk                 Factors:Hypertension.  Sonographer:    Delcie Roch RDCS Referring Phys: [id number] Cyndi Bender IMPRESSIONS  1. Left ventricular ejection fraction, by estimation, is 25 to 30%. The left ventricle has severely decreased function. The left ventricle demonstrates global hypokinesis. Left ventricular diastolic parameters are consistent with Grade II diastolic dysfunction (pseudonormalization).  2. Mildly D-shaped septum suggestive of RV pressure/volume overload. Right ventricular systolic function is mildly  reduced. The right ventricular size is normal. There is normal pulmonary artery systolic pressure. The estimated right ventricular systolic pressure is 31.0 mmHg.  3. Left atrial size was mildly dilated.  4. Right atrial size was mildly dilated.  5. The mitral valve is normal in structure. Trivial mitral valve regurgitation. No evidence of mitral stenosis.  6. The tricuspid valve did not completely coapt. The tricuspid valve is abnormal. Tricuspid valve regurgitation is severe.  7. The aortic valve is tricuspid. Aortic valve regurgitation is not visualized. No aortic stenosis is present.  8. The inferior vena cava is normal in size with <50% respiratory variability, suggesting right atrial pressure of 8 mmHg.  9. A small pericardial effusion is present. FINDINGS  Left Ventricle: Left ventricular ejection fraction, by estimation, is 25 to 30%. The left ventricle has severely decreased function. The left ventricle demonstrates global hypokinesis. The left ventricular internal cavity size was normal in size. There is no left ventricular hypertrophy. Left ventricular diastolic parameters are consistent with Grade II diastolic dysfunction (pseudonormalization). Right Ventricle: Mildly D-shaped septum suggestive of RV pressure/volume overload. The right ventricular size is normal. No increase in right ventricular wall thickness. Right ventricular systolic function is mildly reduced. There is normal pulmonary artery systolic pressure. The tricuspid regurgitant velocity is 2.40 m/s, and with an assumed right atrial pressure of 8 mmHg, the estimated right ventricular systolic pressure is 31.0 mmHg. Left Atrium: Left atrial size was mildly dilated. Right Atrium: Right atrial size was mildly dilated. Pericardium: A small pericardial effusion is present. Mitral Valve: The mitral valve is normal in structure. Trivial mitral valve regurgitation. No evidence of mitral valve stenosis. Tricuspid Valve:  The tricuspid valve did not  completely coapt. The tricuspid valve is abnormal. Tricuspid valve regurgitation is severe. Aortic Valve: The aortic valve is tricuspid. Aortic valve regurgitation is not visualized. No aortic stenosis is present. Pulmonic Valve: The pulmonic valve was normal in structure. Pulmonic valve regurgitation is mild. Aorta: The aortic root is normal in size and structure. Venous: The inferior vena cava is normal in size with less than 50% respiratory variability, suggesting right atrial pressure of 8 mmHg. IAS/Shunts: No atrial level shunt detected by color flow Doppler.  LEFT VENTRICLE PLAX 2D LVIDd:         4.90 cm      Diastology LVIDs:         4.20 cm      LV e' medial:    4.90 cm/s LV PW:         1.00 cm      LV E/e' medial:  15.9 LV IVS:        0.90 cm      LV e' lateral:   6.53 cm/s LVOT diam:     1.60 cm      LV E/e' lateral: 11.9 LV SV:         27 LV SV Index:   17 LVOT Area:     2.01 cm  LV Volumes (MOD) LV vol d, MOD A2C: 104.0 ml LV vol d, MOD A4C: 96.4 ml LV vol s, MOD A2C: 79.6 ml LV vol s, MOD A4C: 66.2 ml LV SV MOD A2C:     24.4 ml LV SV MOD A4C:     96.4 ml LV SV MOD BP:      25.9 ml RIGHT VENTRICLE             IVC RV Basal diam:  3.00 cm     IVC diam: 1.60 cm RV S prime:     11.70 cm/s TAPSE (M-mode): 1.7 cm LEFT ATRIUM           Index        RIGHT ATRIUM           Index LA diam:      3.50 cm 2.15 cm/m   RA Area:     19.30 cm LA Vol (A4C): 63.2 ml 38.87 ml/m  RA Volume:   58.10 ml  35.74 ml/m  AORTIC VALVE LVOT Vmax:   71.00 cm/s LVOT Vmean:  44.700 cm/s LVOT VTI:    0.134 m  AORTA Ao Root diam: 2.40 cm Ao Asc diam:  2.70 cm MITRAL VALVE               TRICUSPID VALVE MV Area (PHT): 4.68 cm    TR Peak grad:   23.0 mmHg MV Decel Time: 162 msec    TR Vmax:        240.00 cm/s MV E velocity: 77.80 cm/s MV A velocity: 67.70 cm/s  SHUNTS MV E/A ratio:  1.15        Systemic VTI:  0.13 m                            Systemic Diam: 1.60 cm Dalton McleanMD Electronically signed by Wilfred Lacy Signature  Date/Time: 07/01/2023/4:17:44 PM    Final         Scheduled Meds:  [WUJ Hold] carvedilol  25 mg Oral BID WC   [MAR Hold] Chlorhexidine Gluconate Cloth  6 each Topical Q0600   [MAR Hold] FLUoxetine  20 mg Oral Daily   [MAR Hold] heparin  5,000 Units Subcutaneous Q8H   [MAR Hold] insulin aspart  0-9 Units Subcutaneous TID WC   [MAR Hold] insulin glargine  6 Units Subcutaneous Q24H   [MAR Hold] losartan  100 mg Oral Daily   [MAR Hold] sevelamer carbonate  2,400 mg Oral TID WC   [MAR Hold] sodium chloride flush  3 mL Intravenous Q12H   sodium chloride flush  3-10 mL Intravenous Q12H   [MAR Hold] sodium zirconium cyclosilicate  10 g Oral TID   Continuous Infusions:  sodium chloride 10 mL/hr at 07/02/23 1255   [MAR Hold] cefTRIAXone (ROCEPHIN)  IV Stopped (07/01/23 1200)     LOS: 2 days    Time spent: 40 minutes    Dorcas Carrow, MD [facility]

## 2023-07-02 NOTE — Plan of Care (Signed)
  Problem: Education: Goal: Knowledge of General Education information will improve Description: Including pain rating scale, medication(s)/side effects and non-pharmacologic comfort measures Outcome: Not Met (add Reason)   Problem: Health Behavior/Discharge Planning: Goal: Ability to manage health-related needs will improve Outcome: Not Met (add Reason)   Problem: Clinical Measurements: Goal: Ability to maintain clinical measurements within normal limits will improve Outcome: Not Met (add Reason) Goal: Will remain free from infection Outcome: Not Met (add Reason) Goal: Diagnostic test results will improve Outcome: Not Met (add Reason) Goal: Respiratory complications will improve Outcome: Not Met (add Reason) Goal: Cardiovascular complication will be avoided Outcome: Not Met (add Reason)   Problem: Activity: Goal: Risk for activity intolerance will decrease Outcome: Not Met (add Reason)   Problem: Nutrition: Goal: Adequate nutrition will be maintained Outcome: Not Met (add Reason)   Problem: Coping: Goal: Level of anxiety will decrease Outcome: Not Met (add Reason)   Problem: Elimination: Goal: Will not experience complications related to bowel motility Outcome: Not Met (add Reason) Goal: Will not experience complications related to urinary retention Outcome: Not Met (add Reason)   Problem: Pain Managment: Goal: General experience of comfort will improve and/or be controlled Outcome: Not Met (add Reason)   Problem: Safety: Goal: Ability to remain free from injury will improve Outcome: Not Met (add Reason)   Problem: Skin Integrity: Goal: Risk for impaired skin integrity will decrease Outcome: Not Met (add Reason)   Problem: Education: Goal: Ability to describe self-care measures that may prevent or decrease complications (Diabetes Survival Skills Education) will improve Outcome: Not Met (add Reason) Goal: Individualized Educational Video(s) Outcome: Not Met (add  Reason)   Problem: Coping: Goal: Ability to adjust to condition or change in health will improve Outcome: Not Met (add Reason)   Problem: Fluid Volume: Goal: Ability to maintain a balanced intake and output will improve Outcome: Not Met (add Reason)   Problem: Health Behavior/Discharge Planning: Goal: Ability to identify and utilize available resources and services will improve Outcome: Not Met (add Reason) Goal: Ability to manage health-related needs will improve Outcome: Not Met (add Reason)   Problem: Metabolic: Goal: Ability to maintain appropriate glucose levels will improve Outcome: Not Met (add Reason)   Problem: Nutritional: Goal: Maintenance of adequate nutrition will improve Outcome: Not Met (add Reason) Goal: Progress toward achieving an optimal weight will improve Outcome: Not Met (add Reason)   Problem: Skin Integrity: Goal: Risk for impaired skin integrity will decrease Outcome: Not Met (add Reason)   Problem: Tissue Perfusion: Goal: Adequacy of tissue perfusion will improve Outcome: Not Met (add Reason)

## 2023-07-02 NOTE — Plan of Care (Signed)
 Dialysis Orders from [facility] [address]  TTS 3h 350/500 2K/2.5Ca  EDW 57.5 kg AVG 16g   Last OP HD 06/27/23 post HD wt 59.7kg   Meds: -Tums EX 750 q HD -Cinacalcet 60 mg three times per week -Calcitriol 1.25 mcg three times per week -Heparin 1600 units bolus then 600 units/hr

## 2023-07-02 NOTE — Anesthesia Preprocedure Evaluation (Addendum)
 Anesthesia Evaluation  Patient identified by MRN, date of birth, ID band Patient awake    Reviewed: Allergy & Precautions, NPO status , Patient's Chart, lab work & pertinent test results, reviewed documented beta blocker date and time   History of Anesthesia Complications Negative for: history of anesthetic complications  Airway Mallampati: II  TM Distance: >3 FB Neck ROM: Full    Dental  (+) Dental Advisory Given, Teeth Intact   Pulmonary neg pulmonary ROS   Pulmonary exam normal        Cardiovascular hypertension, Pt. on medications and Pt. on home beta blockers +CHF  Normal cardiovascular exam+ Valvular Problems/Murmurs    '25 TTE - EF 25 to 30%. Global hypokinesis. Grade II diastolic dysfunction (pseudonormalization). Mildly D-shaped septum suggestive of RV pressure/volume overload. Right ventricular systolic function is mildly reduced. Left atrial size was mildly dilated. Right atrial size was mildly dilated. Trivial mitral valve regurgitation. Tricuspid valve regurgitation is severe. A small pericardial effusion is present.     Neuro/Psych Seizures -, Well Controlled,  PSYCHIATRIC DISORDERS  Depression Bipolar Disorder      GI/Hepatic negative GI ROS, Neg liver ROS,,,  Endo/Other  diabetes, Type 1, Insulin Dependent    K 5.5 yesterday   Renal/GU ESRF and DialysisRenal disease     Musculoskeletal negative musculoskeletal ROS (+)    Abdominal   Peds  Hematology negative hematology ROS (+)   Anesthesia Other Findings   Reproductive/Obstetrics                             Anesthesia Physical Anesthesia Plan  ASA: 4  Anesthesia Plan: General   Post-op Pain Management: Tylenol PO (pre-op)*   Induction: Intravenous  PONV Risk Score and Plan: 3 and Treatment may vary due to age or medical condition, Ondansetron, Dexamethasone and Midazolam  Airway Management Planned: Oral  ETT  Additional Equipment: ClearSight  Intra-op Plan:   Post-operative Plan: Extubation in OR  Informed Consent: I have reviewed the patients History and Physical, chart, labs and discussed the procedure including the risks, benefits and alternatives for the proposed anesthesia with the patient or authorized representative who has indicated his/her understanding and acceptance.     Dental advisory given  Plan Discussed with: CRNA and Anesthesiologist  Anesthesia Plan Comments:         Anesthesia Quick Evaluation

## 2023-07-02 NOTE — Inpatient Diabetes Management (Signed)
 Inpatient Diabetes Program Recommendations  AACE/ADA: New Consensus Statement on Inpatient Glycemic Control (2015)  Target Ranges:  Prepandial:   less than 140 mg/dL      Peak postprandial:   less than 180 mg/dL (1-2 hours)      Critically ill patients:  140 - 180 mg/dL   Lab Results  Component Value Date   GLUCAP 105 (H) 07/02/2023   HGBA1C 10.6 (H) 05/21/2023    Review of Glycemic Control  Latest Reference Range & Units 07/02/23 01:39 07/02/23 02:23 07/02/23 05:45 07/02/23 06:29 07/02/23 12:41  Glucose-Capillary 70 - 99 mg/dL 63 (L) 161 (H) 42 (LL) 150 (H) 105 (H)   Admit with: Suspected cholecystitis    History: Type 1 diabetes, ESRD   Home DM Meds: Tandem Tslim Insulin Pump                              Dexcom G7 CGM                              Novolog 4 units TID (off pump)                              Lantus 8 units daily (off pump)   Current Orders: Novolog Sensitive Correction Scale/ SSI (0-9 units) TID AC                           Lantus 6 units Q24H     Consider reducing the Lantus insulin to 4 units Q24H and changing correction to Novolog 0-6 units Q4H.        ENDO: Leodis Binet Last Seen 10/02/2022 Pump Settings: Time Basal Rate (u/hr) 12:00 AM 0.500 Total Daily Basal: 12.000 u  Time Correction Factor (u:mg/dL) 09:60 AM 4:54  Time Carb Ratio (u:grams) 12:00 AM 1:12.0  Time Target BG (mg/dL) 09:81 AM 191  Insulin Duration: 5 hr    Thanks, Lujean Rave, MSN, RNC-OB Diabetes Coordinator [phone] (8a-5p)

## 2023-07-02 NOTE — Progress Notes (Signed)
  [facility] Progress Note   Subjective:  Seen and examined in K[facility]. Tolerating UF. No cp, sob, n/v. Possible surgery today.   Objective Vitals:   07/01/23 1958 07/02/23 0505 07/02/23 0810 07/02/23 0830  BP: (!) 153/98 (!) 150/107 (!) 136/90 (!) 155/96  Pulse: 70 70 70 70  Resp:  16 18 17   Temp: 98 F (36.7 C) 98.6 F (37 C) 98.2 F (36.8 C)   TempSrc: Oral     SpO2: 98% 99% 97% 97%  Weight:      Height:          Additional Objective Labs: Basic Metabolic Panel: Recent Labs  Lab 06/30/23 1940 06/30/23 2331 07/01/23 0900  NA 136 137 134*  K 5.3* 4.9 5.5*  CL 97* 98 99  CO2 23 25 21*  GLUCOSE 193* 136* 92  BUN 40* 42* 40*  CREATININE 8.35* 8.40* 8.73*  CALCIUM 7.5* 7.5* 7.6*   CBC: Recent Labs  Lab 06/30/23 0815 06/30/23 0826 07/01/23 0900  WBC 8.6  --  8.9  NEUTROABS 4.5  --   --   HGB 11.9* 13.9 12.9  HCT 39.6 41.0 40.9  MCV 85.3  --  83.6  PLT 191  --  198   Blood Culture    Component Value Date/Time   SDES BLOOD BLOOD LEFT ARM 09/07/2022 2237   SDES BLOOD BLOOD LEFT HAND 09/07/2022 2237   SPECREQUEST IN PEDIATRIC BOTTLE Blood Culture adequate volume 09/07/2022 2237   SPECREQUEST  09/07/2022 2237    BOTTLES DRAWN AEROBIC AND ANAEROBIC Blood Culture results may not be optimal due to an inadequate volume of blood received in culture bottles   CULT  09/07/2022 2237    NO GROWTH 5 DAYS Performed at [facility], [address] [place], [place] [address]    CULT  09/07/2022 2237    NO GROWTH 5 DAYS Performed at [facility], [address] [place], [place] [address]    REPTSTATUS 09/12/2022 FINAL 09/07/2022 2237   REPTSTATUS 09/12/2022 FINAL 09/07/2022 2237     Physical Exam General: Young woman, nad Heart: RRR Lungs: Clear, normal wob Abdomen: soft non-tender Extremities: no LE edema  Dialysis Access: R AVF +bruit   Medications:  anticoagulant sodium citrate     cefTRIAXone (ROCEPHIN)  IV Stopped  (07/01/23 1200)    carvedilol  25 mg Oral BID WC   Chlorhexidine Gluconate Cloth  6 each Topical Q0600   FLUoxetine  20 mg Oral Daily   heparin  5,000 Units Subcutaneous Q8H   insulin aspart  0-9 Units Subcutaneous TID WC   insulin glargine  6 Units Subcutaneous Q24H   losartan  100 mg Oral Daily   sevelamer carbonate  2,400 mg Oral TID WC   sodium chloride flush  3 mL Intravenous Q12H   sodium zirconium cyclosilicate  10 g Oral TID    Dialysis Orders:  [facility]. Will call for records.   Assessment/Plan: 1. Acute cholecystitis - on [place]. Surgery consulted - recommend cardiology clearance then plan for lap chole.  2. ESRD -  HD TTS. HD today.  3. Hyperkalemia - Lokelma ordered. Correct with HD today  4. Hyperglycemia/T1DM - per primary  5. HTN/volume-  BP/volume ok  6. Anemia- Hgb >12. No ESA needs.  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   Tomasa Blase PA-C [facility] 07/02/2023,8:55 AM

## 2023-07-02 NOTE — Procedures (Addendum)
 Received patient in bed to unit.  Alert and oriented.  Informed consent signed and in chart.   TX duration: 3 hours  Patient tolerated well.  Transported to short stay.   Alert, without acute distress.  Hand-off given to patient's nurse.   Access used: right fistula Access issues: none  Total UF removed: 2 liters Medication(s) given: none   Lu Duffel, RN [facility]

## 2023-07-02 NOTE — Progress Notes (Signed)
 * Day of Surgery *  Subjective: CC: Had ongoing RUQ and epigastric pain yesterday and this am. Mild nausea yesterday and none this am. Has been NPO since MN  Seen in HD  Objective: Vital signs in last 24 hours: Temp:  [97.5 F (36.4 C)-98.6 F (37 C)] 98.2 F (36.8 C) (03/04 0810) Pulse Rate:  [66-74] 73 (03/04 1000) Resp:  [13-18] 13 (03/04 1000) BP: (136-172)/(90-107) 172/92 (03/04 1000) SpO2:  [96 %-99 %] 98 % (03/04 1000) Last BM Date : 06/29/23  Intake/Output from previous day: 03/03 0701 - 03/04 0700 In: 470 [P.O.:270; IV Piggyback:200] Out: 1 [Urine:1] Intake/Output this shift: No intake/output data recorded.  PE: Gen:  Alert, NAD, pleasant Abd: Soft, ND, epigastric and RUQ ttp - mild without rebound or guarding  Lab Results:  Recent Labs    07/01/23 0900 07/02/23 0735  WBC 8.9 7.1  HGB 12.9 13.0  HCT 40.9 42.2  PLT 198 154   BMET Recent Labs    06/30/23 2331 07/01/23 0900  NA 137 134*  K 4.9 5.5*  CL 98 99  CO2 25 21*  GLUCOSE 136* 92  BUN 42* 40*  CREATININE 8.40* 8.73*  CALCIUM 7.5* 7.6*   PT/INR No results for input(s): "LABPROT", "INR" in the last 72 hours. CMP     Component Value Date/Time   NA 134 (L) 07/01/2023 0900   NA 136 07/13/2021 1553   NA 137 11/12/2013 0423   K 5.5 (H) 07/01/2023 0900   K 4.2 11/12/2013 0423   CL 99 07/01/2023 0900   CL 107 11/12/2013 0423   CO2 21 (L) 07/01/2023 0900   CO2 23 11/12/2013 0423   GLUCOSE 92 07/01/2023 0900   GLUCOSE 339 (H) 11/12/2013 0423   BUN 40 (H) 07/01/2023 0900   BUN 51 (H) 07/13/2021 1553   BUN 7 11/12/2013 0423   CREATININE 8.73 (H) 07/01/2023 0900   CREATININE 0.59 (L) 11/12/2013 0423   CREATININE 0.51 08/24/2013 0957   CALCIUM 7.6 (L) 07/01/2023 0900   CALCIUM 8.2 (L) 11/12/2013 0423   PROT 5.8 (L) 07/01/2023 0900   PROT 5.9 (L) 09/08/2019 0945   PROT 8.0 11/10/2013 2043   ALBUMIN 3.3 (L) 07/01/2023 0900   ALBUMIN 2.9 (L) 09/08/2019 0945   ALBUMIN 3.9 11/10/2013  2043   AST 24 07/01/2023 0900   AST 12 (L) 11/10/2013 2043   ALT 30 07/01/2023 0900   ALT 10 (L) 11/10/2013 2043   ALKPHOS 189 (H) 07/01/2023 0900   ALKPHOS 77 11/10/2013 2043   BILITOT 0.8 07/01/2023 0900   BILITOT <0.2 09/08/2019 0945   BILITOT 0.5 11/10/2013 2043   GFRNONAA 6 (L) 07/01/2023 0900   GFRNONAA >60 11/12/2013 0423   GFRNONAA >89 08/24/2013 0957   GFRAA 44 (L) 01/30/2020 1637   GFRAA >60 11/12/2013 0423   GFRAA >89 08/24/2013 0957   Lipase     Component Value Date/Time   LIPASE 22 06/30/2023 0815   LIPASE 55 (L) 09/10/2011 1356    Studies/Results: ECHOCARDIOGRAM COMPLETE Result Date: 07/01/2023    ECHOCARDIOGRAM REPORT   Patient Name:   Ashley Freeman [facility] Date of Exam: 07/01/2023 Medical Rec #:  [id number]                    Height:       63.0 in Accession #:    [id number]                   Weight:  133.0 lb Date of Birth:  09-16-1990                     BSA:          1.626 m Patient Age:    33 years                     BP:           126/86 mmHg Patient Gender: F                            HR:           66 bpm. Exam Location:  Inpatient Procedure: 2D Echo (Both Spectral and Color Flow Doppler were utilized during            procedure). Indications:    acute systolic chf  History:        Patient has prior history of Echocardiogram examinations, most                 recent 06/10/2020. End stage renal disease; Risk                 Factors:Hypertension.  Sonographer:    Delcie Roch RDCS Referring Phys: [id number] Cyndi Bender IMPRESSIONS  1. Left ventricular ejection fraction, by estimation, is 25 to 30%. The left ventricle has severely decreased function. The left ventricle demonstrates global hypokinesis. Left ventricular diastolic parameters are consistent with Grade II diastolic dysfunction (pseudonormalization).  2. Mildly D-shaped septum suggestive of RV pressure/volume overload. Right ventricular systolic function is mildly reduced. The right ventricular size is  normal. There is normal pulmonary artery systolic pressure. The estimated right ventricular systolic pressure is 31.0 mmHg.  3. Left atrial size was mildly dilated.  4. Right atrial size was mildly dilated.  5. The mitral valve is normal in structure. Trivial mitral valve regurgitation. No evidence of mitral stenosis.  6. The tricuspid valve did not completely coapt. The tricuspid valve is abnormal. Tricuspid valve regurgitation is severe.  7. The aortic valve is tricuspid. Aortic valve regurgitation is not visualized. No aortic stenosis is present.  8. The inferior vena cava is normal in size with <50% respiratory variability, suggesting right atrial pressure of 8 mmHg.  9. A small pericardial effusion is present. FINDINGS  Left Ventricle: Left ventricular ejection fraction, by estimation, is 25 to 30%. The left ventricle has severely decreased function. The left ventricle demonstrates global hypokinesis. The left ventricular internal cavity size was normal in size. There is no left ventricular hypertrophy. Left ventricular diastolic parameters are consistent with Grade II diastolic dysfunction (pseudonormalization). Right Ventricle: Mildly D-shaped septum suggestive of RV pressure/volume overload. The right ventricular size is normal. No increase in right ventricular wall thickness. Right ventricular systolic function is mildly reduced. There is normal pulmonary artery systolic pressure. The tricuspid regurgitant velocity is 2.40 m/s, and with an assumed right atrial pressure of 8 mmHg, the estimated right ventricular systolic pressure is 31.0 mmHg. Left Atrium: Left atrial size was mildly dilated. Right Atrium: Right atrial size was mildly dilated. Pericardium: A small pericardial effusion is present. Mitral Valve: The mitral valve is normal in structure. Trivial mitral valve regurgitation. No evidence of mitral valve stenosis. Tricuspid Valve: The tricuspid valve did not completely coapt. The tricuspid valve is  abnormal. Tricuspid valve regurgitation is severe. Aortic Valve: The aortic valve is tricuspid. Aortic valve regurgitation is not visualized. No aortic  stenosis is present. Pulmonic Valve: The pulmonic valve was normal in structure. Pulmonic valve regurgitation is mild. Aorta: The aortic root is normal in size and structure. Venous: The inferior vena cava is normal in size with less than 50% respiratory variability, suggesting right atrial pressure of 8 mmHg. IAS/Shunts: No atrial level shunt detected by color flow Doppler.  LEFT VENTRICLE PLAX 2D LVIDd:         4.90 cm      Diastology LVIDs:         4.20 cm      LV e' medial:    4.90 cm/s LV PW:         1.00 cm      LV E/e' medial:  15.9 LV IVS:        0.90 cm      LV e' lateral:   6.53 cm/s LVOT diam:     1.60 cm      LV E/e' lateral: 11.9 LV SV:         27 LV SV Index:   17 LVOT Area:     2.01 cm  LV Volumes (MOD) LV vol d, MOD A2C: 104.0 ml LV vol d, MOD A4C: 96.4 ml LV vol s, MOD A2C: 79.6 ml LV vol s, MOD A4C: 66.2 ml LV SV MOD A2C:     24.4 ml LV SV MOD A4C:     96.4 ml LV SV MOD BP:      25.9 ml RIGHT VENTRICLE             IVC RV Basal diam:  3.00 cm     IVC diam: 1.60 cm RV S prime:     11.70 cm/s TAPSE (M-mode): 1.7 cm LEFT ATRIUM           Index        RIGHT ATRIUM           Index LA diam:      3.50 cm 2.15 cm/m   RA Area:     19.30 cm LA Vol (A4C): 63.2 ml 38.87 ml/m  RA Volume:   58.10 ml  35.74 ml/m  AORTIC VALVE LVOT Vmax:   71.00 cm/s LVOT Vmean:  44.700 cm/s LVOT VTI:    0.134 m  AORTA Ao Root diam: 2.40 cm Ao Asc diam:  2.70 cm MITRAL VALVE               TRICUSPID VALVE MV Area (PHT): 4.68 cm    TR Peak grad:   23.0 mmHg MV Decel Time: 162 msec    TR Vmax:        240.00 cm/s MV E velocity: 77.80 cm/s MV A velocity: 67.70 cm/s  SHUNTS MV E/A ratio:  1.15        Systemic VTI:  0.13 m                            Systemic Diam: 1.60 cm Dalton McleanMD Electronically signed by Wilfred Lacy Signature Date/Time: 07/01/2023/4:17:44 PM    Final     US Abdomen Limited RUQ (LIVER/GB) Result Date: 06/30/2023 CLINICAL DATA:  Right upper quadrant pain EXAM: ULTRASOUND ABDOMEN LIMITED RIGHT UPPER QUADRANT COMPARISON:  06/23/2023 FINDINGS: Gallbladder: Small volume material in the dependent gallbladder without shadowing, high-density by prior CT. There is diffuse wall thickening to 6 mm but no focal tenderness or pericholecystic edema. Common bile duct: Diameter: 5 mm Liver: No focal lesion identified. Within  normal limits in parenchymal echogenicity. Portal vein is patent on color Doppler imaging with normal direction of blood flow towards the liver. Other: Small volume perihepatic ascites also seen on prior study. IMPRESSION: Cholelithiasis (better seen on prior CT) and distended, thick walled gallbladder but no sonographic Murphy sign typical of acute cholecystitis. Electronically Signed   By: Tiburcio Pea M.D.   On: 06/30/2023 12:14    Anti-infectives: Anti-infectives (From admission, onward)    Start     Dose/Rate Route Frequency Ordered Stop   07/01/23 1200  cefTRIAXone (ROCEPHIN) 2 g in sodium chloride 0.9 % 100 mL IVPB        2 g 200 mL/hr over 30 Minutes Intravenous Every 24 hours 06/30/23 1731     06/30/23 1830  metroNIDAZOLE (FLAGYL) IVPB 500 mg  Status:  Discontinued        500 mg 100 mL/hr over 60 Minutes Intravenous Every 12 hours 06/30/23 1731 07/01/23 1137   06/30/23 1245  cefTRIAXone (ROCEPHIN) 2 g in sodium chloride 0.9 % 100 mL IVPB        2 g 200 mL/hr over 30 Minutes Intravenous  Once 06/30/23 1239 06/30/23 1402        Assessment/Plan Acute Cholecystitis - RUQ [place] with cholelithiasis and distended, thick-walled gallbladder.  CBD 5 mm.   - AST/ALT/T. bili within normal limits yesterday.  She has a chronically elevated alk phos that is near her baseline on most recent labs.  Repeat LFTs are pending today.  WBC wnl - cards has seen and notes HF appears compensated currently and she has moderate to high but not  prohibitive operative risk. EF  25-30% with sever TR on yesterday echo. No further cards w/u recommended  - in HD this am - reached out to Nephrology and okay for OR today  Plan for lap chole with ICG today  FEN - CLD. NPO midnight.  VTE - SCDs, hep subq ID - Rocephin  T1DM on Insulin pump HFrEF secondary to dilated cardiomyopathy ESRD Dialysis T/T/Sat HTN  HLD  I reviewed Consultant nephrology, cardiology notes, last 24 h vitals and pain scores, last 48 h intake and output, last 24 h labs and trends, and last 24 h imaging results.   LOS: 2 days    Eric Form, [facility] 07/02/2023, 10:04 AM Please see Amion for pager number during day hours 7:00am-4:30pm

## 2023-07-03 ENCOUNTER — Encounter (HOSPITAL_COMMUNITY): Payer: Self-pay | Admitting: General Surgery

## 2023-07-03 DIAGNOSIS — Z992 Dependence on renal dialysis: Secondary | ICD-10-CM | POA: Diagnosis not present

## 2023-07-03 DIAGNOSIS — N186 End stage renal disease: Secondary | ICD-10-CM | POA: Diagnosis not present

## 2023-07-03 DIAGNOSIS — E860 Dehydration: Secondary | ICD-10-CM | POA: Diagnosis not present

## 2023-07-03 DIAGNOSIS — I5042 Chronic combined systolic (congestive) and diastolic (congestive) heart failure: Secondary | ICD-10-CM | POA: Diagnosis not present

## 2023-07-03 DIAGNOSIS — K819 Cholecystitis, unspecified: Secondary | ICD-10-CM | POA: Diagnosis not present

## 2023-07-03 LAB — CBC
HCT: 40.3 % (ref 36.0–46.0)
Hemoglobin: 12.3 g/dL (ref 12.0–15.0)
MCH: 25.6 pg — ABNORMAL LOW (ref 26.0–34.0)
MCHC: 30.5 g/dL (ref 30.0–36.0)
MCV: 83.8 fL (ref 80.0–100.0)
Platelets: 187 10*3/uL (ref 150–400)
RBC: 4.81 MIL/uL (ref 3.87–5.11)
RDW: 15.9 % — ABNORMAL HIGH (ref 11.5–15.5)
WBC: 10.5 10*3/uL (ref 4.0–10.5)
nRBC: 0 % (ref 0.0–0.2)

## 2023-07-03 LAB — GLUCOSE, CAPILLARY
Glucose-Capillary: 307 mg/dL — ABNORMAL HIGH (ref 70–99)
Glucose-Capillary: 309 mg/dL — ABNORMAL HIGH (ref 70–99)
Glucose-Capillary: 445 mg/dL — ABNORMAL HIGH (ref 70–99)
Glucose-Capillary: 447 mg/dL — ABNORMAL HIGH (ref 70–99)
Glucose-Capillary: 346 mg/dL — ABNORMAL HIGH (ref 70–99)
Glucose-Capillary: 244 mg/dL — ABNORMAL HIGH (ref 70–99)

## 2023-07-03 LAB — HEPATIC FUNCTION PANEL
ALT: 26 U/L (ref 0–44)
AST: 32 U/L (ref 15–41)
Albumin: 2.8 g/dL — ABNORMAL LOW (ref 3.5–5.0)
Alkaline Phosphatase: 161 U/L — ABNORMAL HIGH (ref 38–126)
Bilirubin, Direct: 0.2 mg/dL (ref 0.0–0.2)
Indirect Bilirubin: 0.6 mg/dL (ref 0.3–0.9)
Total Bilirubin: 0.8 mg/dL (ref 0.0–1.2)
Total Protein: 5.1 g/dL — ABNORMAL LOW (ref 6.5–8.1)

## 2023-07-03 LAB — BASIC METABOLIC PANEL
Anion gap: 13 (ref 5–15)
BUN: 29 mg/dL — ABNORMAL HIGH (ref 6–20)
CO2: 22 mmol/L (ref 22–32)
Calcium: 7.3 mg/dL — ABNORMAL LOW (ref 8.9–10.3)
Chloride: 99 mmol/L (ref 98–111)
Creatinine, Ser: 6.98 mg/dL — ABNORMAL HIGH (ref 0.44–1.00)
GFR, Estimated: 8 mL/min — ABNORMAL LOW (ref >60)
Glucose, Bld: 278 mg/dL — ABNORMAL HIGH (ref 70–99)
Potassium: 3.8 mmol/L (ref 3.5–5.1)
Sodium: 134 mmol/L — ABNORMAL LOW (ref 135–145)

## 2023-07-03 LAB — SURGICAL PATHOLOGY

## 2023-07-03 MED ORDER — INSULIN ASPART 100 UNIT/ML IJ SOLN: 4.0000 [IU] | Freq: Three times a day (TID) | INTRAMUSCULAR | Status: DC

## 2023-07-03 MED ORDER — INSULIN ASPART 100 UNIT/ML IJ SOLN
10.0000 [IU] | Freq: Once | INTRAMUSCULAR | Status: AC
  Administered 2023-07-03: 10 [IU] via SUBCUTANEOUS

## 2023-07-03 MED ORDER — INSULIN GLARGINE 100 UNIT/ML ~~LOC~~ SOLN
8.0000 [IU] | Freq: Every day | SUBCUTANEOUS | Status: DC
  Filled 2023-07-03 (×2): qty 0.08

## 2023-07-03 MED ORDER — INSULIN ASPART 100 UNIT/ML IJ SOLN
8.0000 [IU] | Freq: Once | INTRAMUSCULAR | Status: AC
  Administered 2023-07-03: 8 [IU] via SUBCUTANEOUS

## 2023-07-03 MED ORDER — HYDROCODONE-ACETAMINOPHEN 5-325 MG PO TABS
1.0000 | ORAL_TABLET | Freq: Four times a day (QID) | ORAL | Status: DC | PRN
  Administered 2023-07-03 – 2023-07-08 (×7): 1 via ORAL
  Filled 2023-07-03 (×9): qty 1

## 2023-07-03 MED ORDER — CALCITRIOL 0.5 MCG PO CAPS
1.2500 ug | ORAL_CAPSULE | ORAL | Status: DC
  Administered 2023-07-04 – 2023-07-06 (×2): 1.25 ug via ORAL
  Filled 2023-07-03 (×2): qty 1

## 2023-07-03 NOTE — Progress Notes (Addendum)
 Patient's BG 445. [employer]. Chiu responded 8 units given. Diet modified. BG recheck 30 minutes after administration of 8 units BG 447. Chiu sent message.

## 2023-07-03 NOTE — Progress Notes (Signed)
 1 Day Post-Op  Subjective: CC: Reports generalized abdominal pain that is worse on her R side. Pain is worsened with movement. R sided abdominal pain is worsened when she tried renal diet and reports associated nausea. This is similar to pre-op. Reports she has been only able to have liquids for the last 2 months otherwise she will get upper abdominal pain and nausea when she tried solid food. Now tolerating cld this am without worsening of her abdominal pain, n/v. BM yesterday. Voiding. Has been oob.   Denies hx of ulcer, gastritis, gastroparesis. No frequent nsaid use.No prior endoscopy.    Reports she does not tolerate oxycodone as it makes her itch but has tolerated hydrocodone in the past.   Objective: Vital signs in last 24 hours: Temp:  [97.1 F (36.2 C)-98.8 F (37.1 C)] 98.8 F (37.1 C) (03/05 0723) Pulse Rate:  [73-86] 86 (03/05 0723) Resp:  [11-25] 16 (03/05 0723) BP: (115-182)/(58-98) 145/58 (03/05 0723) SpO2:  [94 %-100 %] 97 % (03/05 0723) Weight:  [60.3 kg] 60.3 kg (03/04 1242) Last BM Date : 07/03/23  Intake/Output from previous day: 03/04 0701 - 03/05 0700 In: 650 [I.V.:550; IV Piggyback:100] Out: 2025 [Blood:25] Intake/Output this shift: No intake/output data recorded.  PE: Gen:  Alert, NAD, pleasant Abd: Soft, at least mild distension, epigastric and ruq ttp. Appropriately tender around laparoscopic incisions. +BS. Incisions with steri-strips intact appears well and are without drainage, bleeding, or signs of infection   Lab Results:  Recent Labs    07/02/23 0735 07/02/23 1258 07/03/23 0634  WBC 7.1  --  10.5  HGB 13.0 14.6 12.3  HCT 42.2 43.0 40.3  PLT 154  --  187   BMET Recent Labs    07/02/23 1814 07/03/23 0634  NA 135 134*  K 3.7 3.8  CL 95* 99  CO2 21* 22  GLUCOSE 143* 278*  BUN 22* 29*  CREATININE 6.12* 6.98*  CALCIUM 7.6* 7.3*   PT/INR No results for input(s): "LABPROT", "INR" in the last 72 hours. CMP     Component Value  Date/Time   NA 134 (L) 07/03/2023 0634   NA 136 07/13/2021 1553   NA 137 11/12/2013 0423   K 3.8 07/03/2023 0634   K 4.2 11/12/2013 0423   CL 99 07/03/2023 0634   CL 107 11/12/2013 0423   CO2 22 07/03/2023 0634   CO2 23 11/12/2013 0423   GLUCOSE 278 (H) 07/03/2023 0634   GLUCOSE 339 (H) 11/12/2013 0423   BUN 29 (H) 07/03/2023 0634   BUN 51 (H) 07/13/2021 1553   BUN 7 11/12/2013 0423   CREATININE 6.98 (H) 07/03/2023 0634   CREATININE 0.59 (L) 11/12/2013 0423   CREATININE 0.51 08/24/2013 0957   CALCIUM 7.3 (L) 07/03/2023 0634   CALCIUM 8.2 (L) 11/12/2013 0423   PROT 5.8 (L) 07/02/2023 1814   PROT 5.9 (L) 09/08/2019 0945   PROT 8.0 11/10/2013 2043   ALBUMIN 3.0 (L) 07/02/2023 1814   ALBUMIN 2.9 (L) 09/08/2019 0945   ALBUMIN 3.9 11/10/2013 2043   AST 45 (H) 07/02/2023 1814   AST 12 (L) 11/10/2013 2043   ALT 30 07/02/2023 1814   ALT 10 (L) 11/10/2013 2043   ALKPHOS 187 (H) 07/02/2023 1814   ALKPHOS 77 11/10/2013 2043   BILITOT 1.1 07/02/2023 1814   BILITOT <0.2 09/08/2019 0945   BILITOT 0.5 11/10/2013 2043   GFRNONAA 8 (L) 07/03/2023 0634   GFRNONAA >60 11/12/2013 0423   GFRNONAA >89 08/24/2013 4401  GFRAA 44 (L) 01/30/2020 1637   GFRAA >60 11/12/2013 0423   GFRAA >89 08/24/2013 0957   Lipase     Component Value Date/Time   LIPASE 22 06/30/2023 0815   LIPASE 55 (L) 09/10/2011 1356    Studies/Results: ECHOCARDIOGRAM COMPLETE Result Date: 07/01/2023    ECHOCARDIOGRAM REPORT   Patient Name:   Ashley Freeman [facility] Date of Exam: 07/01/2023 Medical Rec #:  [id number]                    Height:       63.0 in Accession #:    [id number]                   Weight:       133.0 lb Date of Birth:  1990/06/17                     BSA:          1.626 m Patient Age:    33 years                     BP:           126/86 mmHg Patient Gender: F                            HR:           66 bpm. Exam Location:  Inpatient Procedure: 2D Echo (Both Spectral and Color Flow Doppler were utilized  during            procedure). Indications:    acute systolic chf  History:        Patient has prior history of Echocardiogram examinations, most                 recent 06/10/2020. End stage renal disease; Risk                 Factors:Hypertension.  Sonographer:    Delcie Roch RDCS Referring Phys: [id number] Cyndi Bender IMPRESSIONS  1. Left ventricular ejection fraction, by estimation, is 25 to 30%. The left ventricle has severely decreased function. The left ventricle demonstrates global hypokinesis. Left ventricular diastolic parameters are consistent with Grade II diastolic dysfunction (pseudonormalization).  2. Mildly D-shaped septum suggestive of RV pressure/volume overload. Right ventricular systolic function is mildly reduced. The right ventricular size is normal. There is normal pulmonary artery systolic pressure. The estimated right ventricular systolic pressure is 31.0 mmHg.  3. Left atrial size was mildly dilated.  4. Right atrial size was mildly dilated.  5. The mitral valve is normal in structure. Trivial mitral valve regurgitation. No evidence of mitral stenosis.  6. The tricuspid valve did not completely coapt. The tricuspid valve is abnormal. Tricuspid valve regurgitation is severe.  7. The aortic valve is tricuspid. Aortic valve regurgitation is not visualized. No aortic stenosis is present.  8. The inferior vena cava is normal in size with <50% respiratory variability, suggesting right atrial pressure of 8 mmHg.  9. A small pericardial effusion is present. FINDINGS  Left Ventricle: Left ventricular ejection fraction, by estimation, is 25 to 30%. The left ventricle has severely decreased function. The left ventricle demonstrates global hypokinesis. The left ventricular internal cavity size was normal in size. There is no left ventricular hypertrophy. Left ventricular diastolic parameters are consistent with Grade II diastolic dysfunction (pseudonormalization). Right Ventricle: Mildly D-shaped septum  suggestive of RV  pressure/volume overload. The right ventricular size is normal. No increase in right ventricular wall thickness. Right ventricular systolic function is mildly reduced. There is normal pulmonary artery systolic pressure. The tricuspid regurgitant velocity is 2.40 m/s, and with an assumed right atrial pressure of 8 mmHg, the estimated right ventricular systolic pressure is 31.0 mmHg. Left Atrium: Left atrial size was mildly dilated. Right Atrium: Right atrial size was mildly dilated. Pericardium: A small pericardial effusion is present. Mitral Valve: The mitral valve is normal in structure. Trivial mitral valve regurgitation. No evidence of mitral valve stenosis. Tricuspid Valve: The tricuspid valve did not completely coapt. The tricuspid valve is abnormal. Tricuspid valve regurgitation is severe. Aortic Valve: The aortic valve is tricuspid. Aortic valve regurgitation is not visualized. No aortic stenosis is present. Pulmonic Valve: The pulmonic valve was normal in structure. Pulmonic valve regurgitation is mild. Aorta: The aortic root is normal in size and structure. Venous: The inferior vena cava is normal in size with less than 50% respiratory variability, suggesting right atrial pressure of 8 mmHg. IAS/Shunts: No atrial level shunt detected by color flow Doppler.  LEFT VENTRICLE PLAX 2D LVIDd:         4.90 cm      Diastology LVIDs:         4.20 cm      LV e' medial:    4.90 cm/s LV PW:         1.00 cm      LV E/e' medial:  15.9 LV IVS:        0.90 cm      LV e' lateral:   6.53 cm/s LVOT diam:     1.60 cm      LV E/e' lateral: 11.9 LV SV:         27 LV SV Index:   17 LVOT Area:     2.01 cm  LV Volumes (MOD) LV vol d, MOD A2C: 104.0 ml LV vol d, MOD A4C: 96.4 ml LV vol s, MOD A2C: 79.6 ml LV vol s, MOD A4C: 66.2 ml LV SV MOD A2C:     24.4 ml LV SV MOD A4C:     96.4 ml LV SV MOD BP:      25.9 ml RIGHT VENTRICLE             IVC RV Basal diam:  3.00 cm     IVC diam: 1.60 cm RV S prime:     11.70 cm/s  TAPSE (M-mode): 1.7 cm LEFT ATRIUM           Index        RIGHT ATRIUM           Index LA diam:      3.50 cm 2.15 cm/m   RA Area:     19.30 cm LA Vol (A4C): 63.2 ml 38.87 ml/m  RA Volume:   58.10 ml  35.74 ml/m  AORTIC VALVE LVOT Vmax:   71.00 cm/s LVOT Vmean:  44.700 cm/s LVOT VTI:    0.134 m  AORTA Ao Root diam: 2.40 cm Ao Asc diam:  2.70 cm MITRAL VALVE               TRICUSPID VALVE MV Area (PHT): 4.68 cm    TR Peak grad:   23.0 mmHg MV Decel Time: 162 msec    TR Vmax:        240.00 cm/s MV E velocity: 77.80 cm/s MV A velocity: 67.70 cm/s  SHUNTS MV E/A  ratio:  1.15        Systemic VTI:  0.13 m                            Systemic Diam: 1.60 cm Dalton McleanMD Electronically signed by Wilfred Lacy Signature Date/Time: 07/01/2023/4:17:44 PM    Final     Anti-infectives: Anti-infectives (From admission, onward)    Start     Dose/Rate Route Frequency Ordered Stop   07/01/23 1200  cefTRIAXone (ROCEPHIN) 2 g in sodium chloride 0.9 % 100 mL IVPB  Status:  Discontinued        2 g 200 mL/hr over 30 Minutes Intravenous Every 24 hours 06/30/23 1731 07/02/23 1604   06/30/23 1830  metroNIDAZOLE (FLAGYL) IVPB 500 mg  Status:  Discontinued        500 mg 100 mL/hr over 60 Minutes Intravenous Every 12 hours 06/30/23 1731 07/01/23 1137   06/30/23 1245  cefTRIAXone (ROCEPHIN) 2 g in sodium chloride 0.9 % 100 mL IVPB        2 g 200 mL/hr over 30 Minutes Intravenous  Once 06/30/23 1239 06/30/23 1402        Assessment/Plan POD 1 s/p laparoscopic cholecystectomy, loa by Dr. Dwain Sarna on 07/02/23 for acute cholecystitis  - Diet as tolerated - Check LFT's - Will discuss w/ Pharmacy PO pain medication options given her ESRD - Mobilize, pulm toilet  FEN - Renal VTE - SCDs, SQH ID - Rocephin peri-op. None currently.   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

## 2023-07-03 NOTE — Progress Notes (Signed)
 [employer] sent message. Patient c/o 9/10 pain. Faces pain scale 0. Patient refuses to take vicodin stating it causes her to itch. Patient states fentanyl does not help her pain, and is demanding either she be able to eat what she wants or an increase in her pain med. Diet was recently changed to dialysis/carb mod with a 1500 FR bc her BG was 445.

## 2023-07-03 NOTE — Progress Notes (Signed)
   07/03/23 1012  TOC Brief Assessment  Insurance and Status Reviewed  Patient has primary care physician Yes (Benison [name])  Home environment has been reviewed home with her mother , and her daughter (33 years old)  Prior level of function: independent  Prior/Current Home Services No current home services  Social Drivers of Health Review SDOH reviewed no interventions necessary  Readmission risk has been reviewed Yes  Transition of care needs no transition of care needs at this time     Transition of Care Department [facility]) has reviewed patient and no TOC needs have been identified at this time. We will continue to monitor patient advancement through interdisciplinary progression rounds. If new patient transition needs arise, please place a TOC consult.

## 2023-07-03 NOTE — Plan of Care (Signed)
  Problem: Education: Goal: Knowledge of General Education information will improve Description: Including pain rating scale, medication(s)/side effects and non-pharmacologic comfort measures Outcome: Progressing   Problem: Clinical Measurements: Goal: Diagnostic test results will improve Outcome: Progressing   Problem: Activity: Goal: Risk for activity intolerance will decrease Outcome: Progressing   Problem: Nutrition: Goal: Adequate nutrition will be maintained Outcome: Progressing   Problem: Coping: Goal: Level of anxiety will decrease Outcome: Progressing   Problem: Elimination: Goal: Will not experience complications related to bowel motility Outcome: Progressing   Problem: Pain Managment: Goal: General experience of comfort will improve and/or be controlled Outcome: Progressing   Problem: Safety: Goal: Ability to remain free from injury will improve Outcome: Progressing   Problem: Skin Integrity: Goal: Risk for impaired skin integrity will decrease Outcome: Progressing

## 2023-07-03 NOTE — Plan of Care (Signed)
  Problem: Education: Goal: Knowledge of General Education information will improve Description: Including pain rating scale, medication(s)/side effects and non-pharmacologic comfort measures Outcome: Not Met (add Reason)   Problem: Health Behavior/Discharge Planning: Goal: Ability to manage health-related needs will improve Outcome: Not Met (add Reason)   Problem: Clinical Measurements: Goal: Ability to maintain clinical measurements within normal limits will improve Outcome: Not Met (add Reason) Goal: Will remain free from infection Outcome: Not Met (add Reason) Goal: Diagnostic test results will improve Outcome: Not Met (add Reason) Goal: Respiratory complications will improve Outcome: Not Met (add Reason) Goal: Cardiovascular complication will be avoided Outcome: Not Met (add Reason)   Problem: Activity: Goal: Risk for activity intolerance will decrease Outcome: Not Met (add Reason)   Problem: Nutrition: Goal: Adequate nutrition will be maintained Outcome: Not Met (add Reason)   Problem: Coping: Goal: Level of anxiety will decrease Outcome: Not Met (add Reason)   Problem: Elimination: Goal: Will not experience complications related to bowel motility Outcome: Not Met (add Reason) Goal: Will not experience complications related to urinary retention Outcome: Not Met (add Reason)   Problem: Pain Managment: Goal: General experience of comfort will improve and/or be controlled Outcome: Not Met (add Reason)   Problem: Safety: Goal: Ability to remain free from injury will improve Outcome: Not Met (add Reason)   Problem: Skin Integrity: Goal: Risk for impaired skin integrity will decrease Outcome: Not Met (add Reason)   Problem: Education: Goal: Ability to describe self-care measures that may prevent or decrease complications (Diabetes Survival Skills Education) will improve Outcome: Not Met (add Reason) Goal: Individualized Educational Video(s) Outcome: Not Met (add  Reason)   Problem: Coping: Goal: Ability to adjust to condition or change in health will improve Outcome: Not Met (add Reason)   Problem: Fluid Volume: Goal: Ability to maintain a balanced intake and output will improve Outcome: Not Met (add Reason)   Problem: Health Behavior/Discharge Planning: Goal: Ability to identify and utilize available resources and services will improve Outcome: Not Met (add Reason) Goal: Ability to manage health-related needs will improve Outcome: Not Met (add Reason)   Problem: Metabolic: Goal: Ability to maintain appropriate glucose levels will improve Outcome: Not Met (add Reason)   Problem: Nutritional: Goal: Maintenance of adequate nutrition will improve Outcome: Not Met (add Reason) Goal: Progress toward achieving an optimal weight will improve Outcome: Not Met (add Reason)   Problem: Skin Integrity: Goal: Risk for impaired skin integrity will decrease Outcome: Not Met (add Reason)   Problem: Tissue Perfusion: Goal: Adequacy of tissue perfusion will improve Outcome: Not Met (add Reason)

## 2023-07-03 NOTE — Progress Notes (Signed)
 Rounding Note    Patient Name: Ashley Freeman Date of Encounter: 07/03/2023  [facility] Cardiologist: Bryan Lemma, MD   Subjective   Denies any chest pain or dyspnea.  Reports some abdominal pain  Inpatient Medications    Scheduled Meds:  carvedilol  25 mg Oral BID WC   Chlorhexidine Gluconate Cloth  6 each Topical Q0600   FLUoxetine  20 mg Oral Daily   heparin  5,000 Units Subcutaneous Q8H   insulin aspart  0-6 Units Subcutaneous TID WC   insulin glargine  4 Units Subcutaneous QHS   losartan  100 mg Oral Daily   sevelamer carbonate  2,400 mg Oral TID WC   sodium chloride flush  3 mL Intravenous Q12H   Continuous Infusions:  PRN Meds: albuterol, dicyclomine, fentaNYL (SUBLIMAZE) injection, heparin, hydrALAZINE, HYDROcodone-acetaminophen, hydrOXYzine, lidocaine-prilocaine, loperamide, menthol-cetylpyridinium, methocarbamol, pentafluoroprop-tetrafluoroeth, SUMAtriptan, trimethobenzamide   Vital Signs    Vitals:   07/02/23 1927 07/03/23 0159 07/03/23 0444 07/03/23 0723  BP: (!) 155/85 (!) 115/98 (!) 148/70 (!) 145/58  Pulse: 79 80 82 86  Resp: 17 17 18 16   Temp: 98.5 F (36.9 C) 98.1 F (36.7 C) 98.6 F (37 C) 98.8 F (37.1 C)  TempSrc: Oral Oral Oral Oral  SpO2: 97% 99% 94% 97%  Weight:      Height:        Intake/Output Summary (Last 24 hours) at 07/03/2023 1321 Last data filed at 07/02/2023 1523 Gross per 24 hour  Intake 650 ml  Output 25 ml  Net 625 ml      07/02/2023   12:42 PM 06/30/2023    8:48 AM 06/17/2023    7:15 AM  Last 3 Weights  Weight (lbs) 133 lb 133 lb 133 lb 9.6 oz  Weight (kg) 60.328 kg 60.328 kg 60.601 kg      Telemetry    NSR - Personally Reviewed  ECG    No new ECG - Personally Reviewed  Physical Exam   GEN: No acute distress.   Neck: No JVD Cardiac: RRR, no murmurs, rubs, or gallops.  Respiratory: Clear to auscultation bilaterally. GI: Soft, nontender, non-distended  MS: No edema; No  deformity. Neuro:  Nonfocal  Psych: Normal affect   Labs    High Sensitivity Troponin:  No results for input(s): "TROPONINIHS" in the last 720 hours.   Chemistry Recent Labs  Lab 07/01/23 0900 07/02/23 1258 07/02/23 1814 07/03/23 0634  NA 134* 136 135 134*  K 5.5* 3.3* 3.7 3.8  CL 99 98 95* 99  CO2 21*  --  21* 22  GLUCOSE 92 100* 143* 278*  BUN 40* 21* 22* 29*  CREATININE 8.73* 5.80* 6.12* 6.98*  CALCIUM 7.6*  --  7.6* 7.3*  PROT 5.8*  --  5.8* 5.1*  ALBUMIN 3.3*  --  3.0* 2.8*  AST 24  --  45* 32  ALT 30  --  30 26  ALKPHOS 189*  --  187* 161*  BILITOT 0.8  --  1.1 0.8  GFRNONAA 6*  --  9* 8*  ANIONGAP 14  --  19* 13    Lipids No results for input(s): "CHOL", "TRIG", "HDL", "LABVLDL", "LDLCALC", "CHOLHDL" in the last 168 hours.  Hematology Recent Labs  Lab 07/01/23 0900 07/02/23 0735 07/02/23 1258 07/03/23 0634  WBC 8.9 7.1  --  10.5  RBC 4.89 5.01  --  4.81  HGB 12.9 13.0 14.6 12.3  HCT 40.9 42.2 43.0 40.3  MCV 83.6 84.2  --  83.8  MCH 26.4 25.9*  --  25.6*  MCHC 31.5 30.8  --  30.5  RDW 15.7* 16.2*  --  15.9*  PLT 198 154  --  187   Thyroid No results for input(s): "TSH", "FREET4" in the last 168 hours.  BNPNo results for input(s): "BNP", "PROBNP" in the last 168 hours.  DDimer No results for input(s): "DDIMER" in the last 168 hours.   Radiology    ECHOCARDIOGRAM COMPLETE Result Date: 07/01/2023    ECHOCARDIOGRAM REPORT   Patient Name:   Ashley Freeman [facility] Date of Exam: 07/01/2023 Medical Rec #:  [id number]                    Height:       63.0 in Accession #:    [id number]                   Weight:       133.0 lb Date of Birth:  07-02-1990                     BSA:          1.626 m Patient Age:    33 years                     BP:           126/86 mmHg Patient Gender: F                            HR:           66 bpm. Exam Location:  Inpatient Procedure: 2D Echo (Both Spectral and Color Flow Doppler were utilized during            procedure).  Indications:    acute systolic chf  History:        Patient has prior history of Echocardiogram examinations, most                 recent 06/10/2020. End stage renal disease; Risk                 Factors:Hypertension.  Sonographer:    Delcie Roch RDCS Referring Phys: [id number] Cyndi Bender IMPRESSIONS  1. Left ventricular ejection fraction, by estimation, is 25 to 30%. The left ventricle has severely decreased function. The left ventricle demonstrates global hypokinesis. Left ventricular diastolic parameters are consistent with Grade II diastolic dysfunction (pseudonormalization).  2. Mildly D-shaped septum suggestive of RV pressure/volume overload. Right ventricular systolic function is mildly reduced. The right ventricular size is normal. There is normal pulmonary artery systolic pressure. The estimated right ventricular systolic pressure is 31.0 mmHg.  3. Left atrial size was mildly dilated.  4. Right atrial size was mildly dilated.  5. The mitral valve is normal in structure. Trivial mitral valve regurgitation. No evidence of mitral stenosis.  6. The tricuspid valve did not completely coapt. The tricuspid valve is abnormal. Tricuspid valve regurgitation is severe.  7. The aortic valve is tricuspid. Aortic valve regurgitation is not visualized. No aortic stenosis is present.  8. The inferior vena cava is normal in size with <50% respiratory variability, suggesting right atrial pressure of 8 mmHg.  9. A small pericardial effusion is present. FINDINGS  Left Ventricle: Left ventricular ejection fraction, by estimation, is 25 to 30%. The left ventricle has severely decreased function. The left ventricle demonstrates global hypokinesis. The left ventricular internal  cavity size was normal in size. There is no left ventricular hypertrophy. Left ventricular diastolic parameters are consistent with Grade II diastolic dysfunction (pseudonormalization). Right Ventricle: Mildly D-shaped septum suggestive of RV  pressure/volume overload. The right ventricular size is normal. No increase in right ventricular wall thickness. Right ventricular systolic function is mildly reduced. There is normal pulmonary artery systolic pressure. The tricuspid regurgitant velocity is 2.40 m/s, and with an assumed right atrial pressure of 8 mmHg, the estimated right ventricular systolic pressure is 31.0 mmHg. Left Atrium: Left atrial size was mildly dilated. Right Atrium: Right atrial size was mildly dilated. Pericardium: A small pericardial effusion is present. Mitral Valve: The mitral valve is normal in structure. Trivial mitral valve regurgitation. No evidence of mitral valve stenosis. Tricuspid Valve: The tricuspid valve did not completely coapt. The tricuspid valve is abnormal. Tricuspid valve regurgitation is severe. Aortic Valve: The aortic valve is tricuspid. Aortic valve regurgitation is not visualized. No aortic stenosis is present. Pulmonic Valve: The pulmonic valve was normal in structure. Pulmonic valve regurgitation is mild. Aorta: The aortic root is normal in size and structure. Venous: The inferior vena cava is normal in size with less than 50% respiratory variability, suggesting right atrial pressure of 8 mmHg. IAS/Shunts: No atrial level shunt detected by color flow Doppler.  LEFT VENTRICLE PLAX 2D LVIDd:         4.90 cm      Diastology LVIDs:         4.20 cm      LV e' medial:    4.90 cm/s LV PW:         1.00 cm      LV E/e' medial:  15.9 LV IVS:        0.90 cm      LV e' lateral:   6.53 cm/s LVOT diam:     1.60 cm      LV E/e' lateral: 11.9 LV SV:         27 LV SV Index:   17 LVOT Area:     2.01 cm  LV Volumes (MOD) LV vol d, MOD A2C: 104.0 ml LV vol d, MOD A4C: 96.4 ml LV vol s, MOD A2C: 79.6 ml LV vol s, MOD A4C: 66.2 ml LV SV MOD A2C:     24.4 ml LV SV MOD A4C:     96.4 ml LV SV MOD BP:      25.9 ml RIGHT VENTRICLE             IVC RV Basal diam:  3.00 cm     IVC diam: 1.60 cm RV S prime:     11.70 cm/s TAPSE (M-mode):  1.7 cm LEFT ATRIUM           Index        RIGHT ATRIUM           Index LA diam:      3.50 cm 2.15 cm/m   RA Area:     19.30 cm LA Vol (A4C): 63.2 ml 38.87 ml/m  RA Volume:   58.10 ml  35.74 ml/m  AORTIC VALVE LVOT Vmax:   71.00 cm/s LVOT Vmean:  44.700 cm/s LVOT VTI:    0.134 m  AORTA Ao Root diam: 2.40 cm Ao Asc diam:  2.70 cm MITRAL VALVE               TRICUSPID VALVE MV Area (PHT): 4.68 cm    TR Peak grad:   23.0 mmHg  MV Decel Time: 162 msec    TR Vmax:        240.00 cm/s MV E velocity: 77.80 cm/s MV A velocity: 67.70 cm/s  SHUNTS MV E/A ratio:  1.15        Systemic VTI:  0.13 m                            Systemic Diam: 1.60 cm Dalton McleanMD Electronically signed by Wilfred Lacy Signature Date/Time: 07/01/2023/4:17:44 PM    Final     Cardiac Studies     Patient Profile     33 y.o. female with a hx of type I DM with hx of DKA admission and on insulin pump, ESRD on HD TTS, HTN, HLD, bipolar d/o, NICM, and HFrEF who is being seen  for the evaluation of pre-op evaluation for lap chole   Assessment & Plan    Chronic systolic heart failure: EF 35% on echo in 2024, echo this admission with EF 25 to 30%, severe TR.  She follows at [facility].  Cardiology consulted for preop evaluation, she is doing well status post laparoscopic cholecystectomy -Appears euvolemic, continue volume management with HD -GDMT: Can continue prior to admission Coreg and losartan -Follow-up with cardiologist at [facility] on discharge  Acute cholecystitis: Status post laparoscopic cholecystectomy.  Doing well postoperatively  ESRD: On HD  [facility] will sign off.   Medication Recommendations: No changes Other recommendations (labs, testing, etc): None Follow up as an outpatient: Will need to follow-up with her cardiologist at [facility]   For questions or updates, please contact [facility] Please consult [email or web address] for contact info under        Signed, Little Ishikawa, MD  07/03/2023, 1:21 PM

## 2023-07-03 NOTE — Progress Notes (Signed)
 Pt receives out-pt HD at [facility] on TTS. Will assist as needed.   Olivia Canter Renal Navigator [phone]

## 2023-07-03 NOTE — Hospital Course (Signed)
 33 year old with type 1 diabetes on insulin pump, chronic systolic heart failure dilated cardiomyopathy, ESRD on hemodialysis TTS, hypertension hyperlipidemia and history of C. difficile presented with body ache and abdominal pain for several days. Seen in the emergency room on 2/23 and discharged home. These days she is not using insulin pump. In the emergency department patient was afebrile. WBC 8.6. Today lactic acid 2.8. 3.2. Ultrasound of the gallbladder with cholelithiasis and distended thick-walled gallbladder with positive Murphy sign.

## 2023-07-03 NOTE — Progress Notes (Signed)
  Progress Note   Patient: Ashley Freeman ZOX:[id number] DOB: 05-20-1990 DOA: 06/30/2023     3 DOS: the patient was seen and examined on 07/03/2023   Brief hospital course: 33 year old with type 1 diabetes on insulin pump, chronic systolic heart failure dilated cardiomyopathy, ESRD on hemodialysis TTS, hypertension hyperlipidemia and history of C. difficile presented with body ache and abdominal pain for several days. Seen in the emergency room on 2/23 and discharged home. These days she is not using insulin pump. In the emergency department patient was afebrile. WBC 8.6. Today lactic acid 2.8. 3.2. Ultrasound of the gallbladder with cholelithiasis and distended thick-walled gallbladder with positive Murphy sign.   Assessment and Plan: Acute calculus cholecystitis:  -Gen Surg following. Pt is now s/p lap chole 3/4 -Continues with nausea and pain this AM -Currently on renal diet    Type 1 diabetes with uncontrolled blood sugars:  -Presented with blood sugars 428.   -Recent A1c 10.6.   -Glucose in the 300's -Will increase lantus to 8 units and add 4 units meal coverage -Cont SSI as needed   ESRD on hemodialysis/hyperkalemia: -Nephrology following for TTS HD -Potassium normalized   Chronic systolic heart failure, hypertension: -Patient with dilated cardiomyopathy.   -Patient previously with reduced ejection fraction, improved in the past.  Repeat echocardiogram was done 3/3 that showed ejection fraction of 25 to 30%.   -Cardiology had been following. Pt to f/u with Cardiology at [facility] on d/c. Cardiology has since signed off   Subjective: Complaining of continued pain and nausea post-op  Physical Exam: Vitals:   07/02/23 1927 07/03/23 0159 07/03/23 0444 07/03/23 0723  BP: (!) 155/85 (!) 115/98 (!) 148/70 (!) 145/58  Pulse: 79 80 82 86  Resp: 17 17 18 16   Temp: 98.5 F (36.9 C) 98.1 F (36.7 C) 98.6 F (37 C) 98.8 F (37.1 C)  TempSrc: Oral Oral Oral Oral  SpO2: 97% 99%  94% 97%  Weight:      Height:       General exam: Awake, laying in bed, in nad Respiratory system: Normal respiratory effort, no wheezing Cardiovascular system: regular rate, s1, s2 Gastrointestinal system: Soft, nondistended, positive BS Central nervous system: CN2-12 grossly intact, strength intact Extremities: Perfused, no clubbing Skin: Normal skin turgor, no notable skin lesions seen Psychiatry: Mood normal // no visual hallucinations   Data Reviewed:  Labs reviewed: Na 134, K 3.8, Cr 6.98, Alk phos 161, WBC 10.5, Hgb 12.3, Plts 187  Family Communication: Pt in room, family not at bedside  Disposition: Status is: Inpatient Remains inpatient appropriate because: severity of illness  Planned Discharge Destination: Home    Author: Rickey Barbara, MD 07/03/2023 3:23 PM  For on call review [email or web address].

## 2023-07-03 NOTE — Progress Notes (Signed)
 [facility] Progress Note   Subjective:  Seen in room. Surgery yesterday - lap chole with LOA. Some abdominal pain, nausea today.  No issues with dialysis.   Objective Vitals:   07/02/23 1927 07/03/23 0159 07/03/23 0444 07/03/23 0723  BP: (!) 155/85 (!) 115/98 (!) 148/70 (!) 145/58  Pulse: 79 80 82 86  Resp: 17 17 18 16   Temp: 98.5 F (36.9 C) 98.1 F (36.7 C) 98.6 F (37 C) 98.8 F (37.1 C)  TempSrc: Oral Oral Oral Oral  SpO2: 97% 99% 94% 97%  Weight:      Height:        Additional Objective Labs: Basic Metabolic Panel: Recent Labs  Lab 07/01/23 0900 07/02/23 1258 07/02/23 1814 07/03/23 0634  NA 134* 136 135 134*  K 5.5* 3.3* 3.7 3.8  CL 99 98 95* 99  CO2 21*  --  21* 22  GLUCOSE 92 100* 143* 278*  BUN 40* 21* 22* 29*  CREATININE 8.73* 5.80* 6.12* 6.98*  CALCIUM 7.6*  --  7.6* 7.3*   CBC: Recent Labs  Lab 06/30/23 0815 06/30/23 0826 07/01/23 0900 07/02/23 0735 07/02/23 1258 07/03/23 0634  WBC 8.6  --  8.9 7.1  --  10.5  NEUTROABS 4.5  --   --   --   --   --   HGB 11.9*   < > 12.9 13.0 14.6 12.3  HCT 39.6   < > 40.9 42.2 43.0 40.3  MCV 85.3  --  83.6 84.2  --  83.8  PLT 191  --  198 154  --  187   < > = values in this interval not displayed.   Blood Culture    Component Value Date/Time   SDES BLOOD BLOOD LEFT ARM 09/07/2022 2237   SDES BLOOD BLOOD LEFT HAND 09/07/2022 2237   SPECREQUEST IN PEDIATRIC BOTTLE Blood Culture adequate volume 09/07/2022 2237   SPECREQUEST  09/07/2022 2237    BOTTLES DRAWN AEROBIC AND ANAEROBIC Blood Culture results may not be optimal due to an inadequate volume of blood received in culture bottles   CULT  09/07/2022 2237    NO GROWTH 5 DAYS Performed at [facility], [address] [place], [place] [address]    CULT  09/07/2022 2237    NO GROWTH 5 DAYS Performed at [facility], [address] [place], [place] [address]    REPTSTATUS 09/12/2022 FINAL 09/07/2022 2237   REPTSTATUS  09/12/2022 FINAL 09/07/2022 2237     Physical Exam General: Young woman, nad Heart: RRR Lungs: Clear, normal wob Abdomen: soft mild tenderness around incision site Extremities: no LE edema  Dialysis Access: R AVG +bruit   Medications:    carvedilol  25 mg Oral BID WC   Chlorhexidine Gluconate Cloth  6 each Topical Q0600   FLUoxetine  20 mg Oral Daily   heparin  5,000 Units Subcutaneous Q8H   insulin aspart  0-6 Units Subcutaneous TID WC   insulin glargine  4 Units Subcutaneous QHS   losartan  100 mg Oral Daily   sevelamer carbonate  2,400 mg Oral TID WC   sodium chloride flush  3 mL Intravenous Q12H    Dialysis Orders:  [facility].  TTS 3h 350/500 2K/2.5Ca  EDW 57.5 kg AVG 16g  Last OP HD 06/27/23 post HD wt 59.7kg  -Tums EX 750 q HD -Cinacalcet 60 mg three times per week -Calcitriol 1.25 mcg three times per week -Heparin 1600 units bolus then 600 units/hr  Assessment/Plan: 1. Acute cholecystitis - s/p lap chole with LOA. Per surgery  2. ESRD -  HD TTS. Continue on schedule. Next HD Thurs.  3. Hyperkalemia - Resolved.  4. Hyperglycemia/T1DM - per primary  5. HTN/volume-  BP ok. Not to EDW, UF as able.  6. Anemia- Hgb >12. No ESA needs.  7. MBD -Correct Ca ok. Resume home meds   Tomasa Blase PA-C [facility] 07/03/2023,1:42 PM

## 2023-07-04 DIAGNOSIS — E860 Dehydration: Secondary | ICD-10-CM | POA: Diagnosis not present

## 2023-07-04 DIAGNOSIS — N186 End stage renal disease: Secondary | ICD-10-CM | POA: Diagnosis not present

## 2023-07-04 DIAGNOSIS — K819 Cholecystitis, unspecified: Secondary | ICD-10-CM | POA: Diagnosis not present

## 2023-07-04 DIAGNOSIS — Z992 Dependence on renal dialysis: Secondary | ICD-10-CM | POA: Diagnosis not present

## 2023-07-04 LAB — CBC
HCT: 36.9 % (ref 36.0–46.0)
Hemoglobin: 11.3 g/dL — ABNORMAL LOW (ref 12.0–15.0)
MCH: 25.9 pg — ABNORMAL LOW (ref 26.0–34.0)
MCHC: 30.6 g/dL (ref 30.0–36.0)
MCV: 84.6 fL (ref 80.0–100.0)
Platelets: 180 10*3/uL (ref 150–400)
RBC: 4.36 MIL/uL (ref 3.87–5.11)
RDW: 15.9 % — ABNORMAL HIGH (ref 11.5–15.5)
WBC: 8.8 10*3/uL (ref 4.0–10.5)
nRBC: 0 % (ref 0.0–0.2)

## 2023-07-04 LAB — COMPREHENSIVE METABOLIC PANEL
ALT: 20 U/L (ref 0–44)
AST: 19 U/L (ref 15–41)
Albumin: 2.8 g/dL — ABNORMAL LOW (ref 3.5–5.0)
Alkaline Phosphatase: 138 U/L — ABNORMAL HIGH (ref 38–126)
Anion gap: 18 — ABNORMAL HIGH (ref 5–15)
BUN: 40 mg/dL — ABNORMAL HIGH (ref 6–20)
CO2: 17 mmol/L — ABNORMAL LOW (ref 22–32)
Calcium: 7.6 mg/dL — ABNORMAL LOW (ref 8.9–10.3)
Chloride: 98 mmol/L (ref 98–111)
Creatinine, Ser: 7.92 mg/dL — ABNORMAL HIGH (ref 0.44–1.00)
GFR, Estimated: 6 mL/min — ABNORMAL LOW (ref >60)
Glucose, Bld: 385 mg/dL — ABNORMAL HIGH (ref 70–99)
Potassium: 4.3 mmol/L (ref 3.5–5.1)
Sodium: 133 mmol/L — ABNORMAL LOW (ref 135–145)
Total Bilirubin: 1.6 mg/dL — ABNORMAL HIGH (ref 0.0–1.2)
Total Protein: 5.2 g/dL — ABNORMAL LOW (ref 6.5–8.1)

## 2023-07-04 LAB — GLUCOSE, CAPILLARY
Glucose-Capillary: 402 mg/dL — ABNORMAL HIGH (ref 70–99)
Glucose-Capillary: 327 mg/dL — ABNORMAL HIGH (ref 70–99)
Glucose-Capillary: 58 mg/dL — ABNORMAL LOW (ref 70–99)
Glucose-Capillary: 167 mg/dL — ABNORMAL HIGH (ref 70–99)
Glucose-Capillary: 33 mg/dL — CL (ref 70–99)
Glucose-Capillary: 41 mg/dL — CL (ref 70–99)
Glucose-Capillary: 158 mg/dL — ABNORMAL HIGH (ref 70–99)

## 2023-07-04 MED ORDER — INSULIN ASPART 100 UNIT/ML IJ SOLN: 4.0000 [IU] | Freq: Three times a day (TID) | INTRAMUSCULAR | Status: DC

## 2023-07-04 MED ORDER — CAMPHOR-MENTHOL 0.5-0.5 % EX LOTN: TOPICAL_LOTION | CUTANEOUS | Status: DC | PRN

## 2023-07-04 MED ORDER — GLUCOSE 40 % PO GEL: 2.0000 | ORAL | Status: AC

## 2023-07-04 MED ORDER — DEXTROSE 50 % IV SOLN
INTRAVENOUS | Status: AC
  Administered 2023-07-04: 50 mL
  Filled 2023-07-04: qty 50

## 2023-07-04 MED ORDER — HEPARIN SODIUM (PORCINE) 1000 UNIT/ML DIALYSIS: 1000.0000 [IU] | INTRAMUSCULAR | Status: DC | PRN

## 2023-07-04 MED ORDER — GLUCOSE 40 % PO GEL
ORAL | Status: AC
  Administered 2023-07-04: 37.5 g
  Filled 2023-07-04: qty 1.21

## 2023-07-04 MED ORDER — PROSOURCE PLUS PO LIQD
30.0000 mL | Freq: Two times a day (BID) | ORAL | Status: DC
  Administered 2023-07-08: 30 mL via ORAL
  Filled 2023-07-04 (×3): qty 30

## 2023-07-04 MED ORDER — INSULIN ASPART 100 UNIT/ML IJ SOLN
6.0000 [IU] | Freq: Three times a day (TID) | INTRAMUSCULAR | Status: DC
  Administered 2023-07-04: 6 [IU] via SUBCUTANEOUS

## 2023-07-04 MED ORDER — HEPARIN SODIUM (PORCINE) 1000 UNIT/ML IJ SOLN
INTRAMUSCULAR | Status: AC
  Filled 2023-07-04: qty 3

## 2023-07-04 MED ORDER — INSULIN GLARGINE 100 UNIT/ML ~~LOC~~ SOLN
8.0000 [IU] | Freq: Every day | SUBCUTANEOUS | Status: DC
  Administered 2023-07-05 – 2023-07-08 (×4): 8 [IU] via SUBCUTANEOUS
  Filled 2023-07-04 (×4): qty 0.08

## 2023-07-04 MED ORDER — INSULIN GLARGINE 100 UNIT/ML ~~LOC~~ SOLN
10.0000 [IU] | Freq: Every day | SUBCUTANEOUS | Status: DC
  Administered 2023-07-04: 10 [IU] via SUBCUTANEOUS
  Filled 2023-07-04: qty 0.1

## 2023-07-04 MED ORDER — HEPARIN SODIUM (PORCINE) 1000 UNIT/ML DIALYSIS
2600.0000 [IU] | INTRAMUSCULAR | Status: DC | PRN
  Administered 2023-07-04: 2600 [IU] via INTRAVENOUS_CENTRAL

## 2023-07-04 MED ORDER — LIDOCAINE HCL (PF) 1 % IJ SOLN: 5.0000 mL | INTRAMUSCULAR | Status: DC | PRN

## 2023-07-04 MED ORDER — ANTICOAGULANT SODIUM CITRATE 4% (200MG/5ML) IV SOLN: 5.0000 mL | Status: DC | PRN

## 2023-07-04 NOTE — Plan of Care (Signed)
  Problem: Education: Goal: Knowledge of General Education information will improve Description: Including pain rating scale, medication(s)/side effects and non-pharmacologic comfort measures Outcome: Progressing   Problem: Clinical Measurements: Goal: Will remain free from infection Outcome: Progressing   Problem: Nutrition: Goal: Adequate nutrition will be maintained Outcome: Progressing   Problem: Pain Managment: Goal: General experience of comfort will improve and/or be controlled Outcome: Progressing   Problem: Education: Goal: Ability to describe self-care measures that may prevent or decrease complications (Diabetes Survival Skills Education) will improve Outcome: Progressing   Problem: Health Behavior/Discharge Planning: Goal: Ability to identify and utilize available resources and services will improve Outcome: Progressing

## 2023-07-04 NOTE — Progress Notes (Signed)
 Received patient in bed to unit.  Alert and oriented.  Informed consent signed and in chart.   TX duration:  Patient tolerated well.  Transported back to the room  Alert, without acute distress.  Hand-off given to patient's nurse.   Access used: AVF Access issues: None  Total UF removed: 1.3 Medication(s) given: None   07/04/23 1645  Vitals  Temp 98.2 F (36.8 C)  Pulse Rate 87  Resp (!) 22  BP (!) 141/81  SpO2 99 %  O2 Device Room Air  Oxygen Therapy  Patient Activity (if Appropriate) In bed  Pulse Oximetry Type Continuous  Oximetry Probe Site Changed No  During Treatment Monitoring  Blood Flow Rate (mL/min) 350 mL/min  Arterial Pressure (mmHg) -193.31 mmHg  Venous Pressure (mmHg) 233.94 mmHg  TMP (mmHg) 8.89 mmHg  Ultrafiltration Rate (mL/min) 971 mL/min  Dialysate Flow Rate (mL/min) 300 ml/min  Dialysate Potassium Concentration 3  Dialysate Calcium Concentration 2.5  Duration of HD Treatment -hour(s) 2.01 hour(s)  Cumulative Fluid Removed (mL) per Treatment  1250.93  HD Safety Checks Performed Yes  Intra-Hemodialysis Comments See progress note (Pt requested to come off early , she signed AMA form)

## 2023-07-04 NOTE — Progress Notes (Signed)
 [facility] Progress Note   Subjective:  Seen in room. Eating some but still having abdominal discomfort,nausea. For dialysis today.   Objective Vitals:   07/03/23 2054 07/04/23 0442 07/04/23 0700 07/04/23 0807  BP: (!) 135/90 111/66  127/60  Pulse: 86 77  82  Resp: 15 15  17   Temp: 98.8 F (37.1 C) 98.3 F (36.8 C)  99.4 F (37.4 C)  TempSrc: Oral Oral  Oral  SpO2: 95% 95%  97%  Weight:   63.4 kg   Height:        Additional Objective Labs: Basic Metabolic Panel: Recent Labs  Lab 07/02/23 1814 07/03/23 0634 07/04/23 0701  NA 135 134* 133*  K 3.7 3.8 4.3  CL 95* 99 98  CO2 21* 22 17*  GLUCOSE 143* 278* 385*  BUN 22* 29* 40*  CREATININE 6.12* 6.98* 7.92*  CALCIUM 7.6* 7.3* 7.6*   CBC: Recent Labs  Lab 06/30/23 0815 06/30/23 0826 07/01/23 0900 07/02/23 0735 07/02/23 1258 07/03/23 0634 07/04/23 0701  WBC 8.6  --  8.9 7.1  --  10.5 8.8  NEUTROABS 4.5  --   --   --   --   --   --   HGB 11.9*   < > 12.9 13.0 14.6 12.3 11.3*  HCT 39.6   < > 40.9 42.2 43.0 40.3 36.9  MCV 85.3  --  83.6 84.2  --  83.8 84.6  PLT 191  --  198 154  --  187 180   < > = values in this interval not displayed.   Blood Culture    Component Value Date/Time   SDES BLOOD BLOOD LEFT ARM 09/07/2022 2237   SDES BLOOD BLOOD LEFT HAND 09/07/2022 2237   SPECREQUEST IN PEDIATRIC BOTTLE Blood Culture adequate volume 09/07/2022 2237   SPECREQUEST  09/07/2022 2237    BOTTLES DRAWN AEROBIC AND ANAEROBIC Blood Culture results may not be optimal due to an inadequate volume of blood received in culture bottles   CULT  09/07/2022 2237    NO GROWTH 5 DAYS Performed at [facility], [address] [place], [place] [address]    CULT  09/07/2022 2237    NO GROWTH 5 DAYS Performed at [facility], [address] [place], [place] [address]    REPTSTATUS 09/12/2022 FINAL 09/07/2022 2237   REPTSTATUS 09/12/2022 FINAL 09/07/2022 2237     Physical Exam General:  Young woman, nad Heart: RRR Lungs: Clear, normal wob Abdomen: soft mild tenderness around incision site Extremities: no LE edema  Dialysis Access: R AVG +bruit   Medications:    calcitRIOL  1.25 mcg Oral Q T,Th,Sat-1800   carvedilol  25 mg Oral BID WC   Chlorhexidine Gluconate Cloth  6 each Topical Q0600   FLUoxetine  20 mg Oral Daily   heparin  5,000 Units Subcutaneous Q8H   insulin aspart  0-6 Units Subcutaneous TID WC   insulin aspart  6 Units Subcutaneous TID WC   insulin glargine  10 Units Subcutaneous Daily   losartan  100 mg Oral Daily   sevelamer carbonate  2,400 mg Oral TID WC   sodium chloride flush  3 mL Intravenous Q12H    Dialysis Orders:  [facility].  TTS 3h 350/500 2K/2.5Ca  EDW 57.5 kg AVG 16g  Last OP HD 06/27/23 post HD wt 59.7kg  -Tums EX 750 q HD -Cinacalcet 60 mg three times per week -Calcitriol 1.25 mcg three times per week -Heparin 1600 units bolus  then 600 units/hr   Assessment/Plan: 1. Acute cholecystitis - s/p lap chole with LOA. Per surgery  2. ESRD -  HD TTS. Continue on schedule. Next HD Thurs.  3. Hyperkalemia - Resolved.  4. Hyperglycemia/T1DM - per primary  5. HTN/volume-  BP ok. Not to EDW, UF as able.  6. Anemia- Hgb >12. No ESA needs. 7. MBD -Corr Ca ok. Resume home meds. Hold sensipar for now.  8. Nutrition - add prot supp for low albumin   Tomasa Blase PA-C [facility] 07/04/2023,1:41 PM

## 2023-07-04 NOTE — Progress Notes (Signed)
  Progress Note   Patient: Ashley Freeman FAO:[id number] DOB: 1991-03-31 DOA: 06/30/2023     4 DOS: the patient was seen and examined on 07/04/2023   Brief hospital course: 33 year old with type 1 diabetes on insulin pump, chronic systolic heart failure dilated cardiomyopathy, ESRD on hemodialysis TTS, hypertension hyperlipidemia and history of C. difficile presented with body ache and abdominal pain for several days. Seen in the emergency room on 2/23 and discharged home. These days she is not using insulin pump. In the emergency department patient was afebrile. WBC 8.6. Today lactic acid 2.8. 3.2. Ultrasound of the gallbladder with cholelithiasis and distended thick-walled gallbladder with positive Murphy sign.   Assessment and Plan: Acute calculus cholecystitis:  -Gen Surg following. Pt is now s/p lap chole 3/4 -Continue with analgesia and anti-emetic as needed -continue on renal/carb mod diet   Type 1 diabetes with uncontrolled blood sugars:  -Presented with blood sugars 428.   -Recent A1c 10.6.   -Glucose in the 400's this AM -Will increase lantus to 10 units and 6 units meal coverage -Cont SSI as needed   ESRD on hemodialysis/hyperkalemia: -Nephrology following for TTS HD -Potassium normalized   Chronic systolic heart failure, hypertension: -Patient with dilated cardiomyopathy.   -Patient previously with reduced ejection fraction, improved in the past.  Repeat echocardiogram was done 3/3 that showed ejection fraction of 25 to 30%.   -Cardiology had been following. Pt to f/u with Cardiology at [facility] on d/c. Cardiology has since signed off   Subjective: Seen this AM. Without complaints presently. Glucose in the 400's this AM  Physical Exam: Vitals:   07/04/23 1445 07/04/23 1500 07/04/23 1530 07/04/23 1600  BP: (!) 145/71 139/74 (!) 150/85 (!) 149/81  Pulse: 79 79 81 83  Resp: 18 20 19  (!) 22  Temp:      TempSrc:      SpO2: 97% 98% 97% 95%  Weight: 64.9 kg      Height:       General exam: Conversant, in no acute distress Respiratory system: normal chest rise, clear, no audible wheezing Cardiovascular system: regular rhythm, s1-s2 Gastrointestinal system: Nondistended, nontender, pos BS Central nervous system: No seizures, no tremors Extremities: No cyanosis, no joint deformities Skin: No rashes, no pallor Psychiatry: Affect normal // no auditory hallucinations   Data Reviewed:  Labs reviewed: Na 133, K 4.3, WBC 8.8, Hgb 11.3  Family Communication: Pt in room, family not at bedside  Disposition: Status is: Inpatient Remains inpatient appropriate because: severity of illness  Planned Discharge Destination: Home    Author: Rickey Barbara, MD 07/04/2023 4:37 PM  For on call review [email or web address].

## 2023-07-04 NOTE — Progress Notes (Signed)
 2 Days Post-Op  Subjective: CC: Stable abdominal pain from yesterday that is described as generalized abdominal pain that is worse on her R side. R sided abdominal pain is worsened when she eats solid food and has associated nausea. This is similar to pre-op pain. She was able to eat 2 grilled cheese yesterday. No vomiting. She tolerates liquids without worsening of her abdominal pain or n/v. BM yesterday. Voiding. Has been oob.   Afebrile. No tachycardia or hypotension. WBC wnl. Alk Phos 187 > 161 and remainder of LFT's wnl yesterday - pending today.  Objective: Vital signs in last 24 hours: Temp:  [98.3 F (36.8 C)-98.8 F (37.1 C)] 98.3 F (36.8 C) (03/06 0442) Pulse Rate:  [77-86] 77 (03/06 0442) Resp:  [15-16] 15 (03/06 0442) BP: (111-141)/(62-90) 111/66 (03/06 0442) SpO2:  [95 %-100 %] 95 % (03/06 0442) Weight:  [63.4 kg] 63.4 kg (03/06 0700) Last BM Date : 07/03/23  Intake/Output from previous day: 03/05 0701 - 03/06 0700 In: 711 [P.O.:711] Out: -  Intake/Output this shift: No intake/output data recorded.  PE: Gen:  Alert, NAD, pleasant Abd: Soft, at least mild distension, epigastric and ruq ttp. Appropriately tender around laparoscopic incisions. +BS. Incisions with steri-strips intact appears well and are without drainage, bleeding, or signs of infection   Lab Results:  Recent Labs    07/03/23 0634 07/04/23 0701  WBC 10.5 8.8  HGB 12.3 11.3*  HCT 40.3 36.9  PLT 187 180   BMET Recent Labs    07/02/23 1814 07/03/23 0634  NA 135 134*  K 3.7 3.8  CL 95* 99  CO2 21* 22  GLUCOSE 143* 278*  BUN 22* 29*  CREATININE 6.12* 6.98*  CALCIUM 7.6* 7.3*   PT/INR No results for input(s): "LABPROT", "INR" in the last 72 hours. CMP     Component Value Date/Time   NA 134 (L) 07/03/2023 0634   NA 136 07/13/2021 1553   NA 137 11/12/2013 0423   K 3.8 07/03/2023 0634   K 4.2 11/12/2013 0423   CL 99 07/03/2023 0634   CL 107 11/12/2013 0423   CO2 22 07/03/2023  0634   CO2 23 11/12/2013 0423   GLUCOSE 278 (H) 07/03/2023 0634   GLUCOSE 339 (H) 11/12/2013 0423   BUN 29 (H) 07/03/2023 0634   BUN 51 (H) 07/13/2021 1553   BUN 7 11/12/2013 0423   CREATININE 6.98 (H) 07/03/2023 0634   CREATININE 0.59 (L) 11/12/2013 0423   CREATININE 0.51 08/24/2013 0957   CALCIUM 7.3 (L) 07/03/2023 0634   CALCIUM 8.2 (L) 11/12/2013 0423   PROT 5.1 (L) 07/03/2023 0634   PROT 5.9 (L) 09/08/2019 0945   PROT 8.0 11/10/2013 2043   ALBUMIN 2.8 (L) 07/03/2023 0634   ALBUMIN 2.9 (L) 09/08/2019 0945   ALBUMIN 3.9 11/10/2013 2043   AST 32 07/03/2023 0634   AST 12 (L) 11/10/2013 2043   ALT 26 07/03/2023 0634   ALT 10 (L) 11/10/2013 2043   ALKPHOS 161 (H) 07/03/2023 0634   ALKPHOS 77 11/10/2013 2043   BILITOT 0.8 07/03/2023 0634   BILITOT <0.2 09/08/2019 0945   BILITOT 0.5 11/10/2013 2043   GFRNONAA 8 (L) 07/03/2023 0634   GFRNONAA >60 11/12/2013 0423   GFRNONAA >89 08/24/2013 0957   GFRAA 44 (L) 01/30/2020 1637   GFRAA >60 11/12/2013 0423   GFRAA >89 08/24/2013 0957   Lipase     Component Value Date/Time   LIPASE 22 06/30/2023 0815   LIPASE 55 (L) 09/10/2011  1356    Studies/Results: No results found.   Anti-infectives: Anti-infectives (From admission, onward)    Start     Dose/Rate Route Frequency Ordered Stop   07/01/23 1200  cefTRIAXone (ROCEPHIN) 2 g in sodium chloride 0.9 % 100 mL IVPB  Status:  Discontinued        2 g 200 mL/hr over 30 Minutes Intravenous Every 24 hours 06/30/23 1731 07/02/23 1604   06/30/23 1830  metroNIDAZOLE (FLAGYL) IVPB 500 mg  Status:  Discontinued        500 mg 100 mL/hr over 60 Minutes Intravenous Every 12 hours 06/30/23 1731 07/01/23 1137   06/30/23 1245  cefTRIAXone (ROCEPHIN) 2 g in sodium chloride 0.9 % 100 mL IVPB        2 g 200 mL/hr over 30 Minutes Intravenous  Once 06/30/23 1239 06/30/23 1402        Assessment/Plan POD 2 s/p laparoscopic cholecystectomy, loa by Dr. Dwain Sarna on 07/02/23 for acute cholecystitis   - Diet as tolerated - Labs reassuring yesterday. WBC wnl. CMP pending today.  - Discussed w/ Pharmacy PO pain medication options given her ESRD - cont norco - Mobilize, pulm toilet  FEN - Renal VTE - SCDs, SQH ID - Rocephin peri-op. None currently.   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 Surgery 07/04/2023, 7:50 AM Please see Amion for pager number during day hours 7:00am-4:30pm

## 2023-07-04 NOTE — Progress Notes (Signed)
 Hypoglycemic Event  CBG: 58  Treatment: 4 oz juice/soda, 1 tube glucose gel, and D50 25 mL (12.5 gm)  Symptoms: Sweaty and Shaky  Follow-up CBG: Time:1924 CBG Result:167  Possible Reasons for Event: Inadequate meal intake  Comments/MD notified: Rickey Barbara MD     Orma Render Tamera Stands

## 2023-07-04 NOTE — Progress Notes (Signed)
 Pt continues to refuse heparin injections. Explained and educated why injections are important. Pt still refused.

## 2023-07-05 DIAGNOSIS — K819 Cholecystitis, unspecified: Secondary | ICD-10-CM | POA: Diagnosis not present

## 2023-07-05 DIAGNOSIS — N186 End stage renal disease: Secondary | ICD-10-CM | POA: Diagnosis not present

## 2023-07-05 DIAGNOSIS — E860 Dehydration: Secondary | ICD-10-CM | POA: Diagnosis not present

## 2023-07-05 DIAGNOSIS — Z992 Dependence on renal dialysis: Secondary | ICD-10-CM | POA: Diagnosis not present

## 2023-07-05 LAB — COMPREHENSIVE METABOLIC PANEL
ALT: 23 U/L (ref 0–44)
AST: 35 U/L (ref 15–41)
Albumin: 3.1 g/dL — ABNORMAL LOW (ref 3.5–5.0)
Alkaline Phosphatase: 165 U/L — ABNORMAL HIGH (ref 38–126)
Anion gap: 17 — ABNORMAL HIGH (ref 5–15)
BUN: 30 mg/dL — ABNORMAL HIGH (ref 6–20)
CO2: 23 mmol/L (ref 22–32)
Calcium: 8.4 mg/dL — ABNORMAL LOW (ref 8.9–10.3)
Chloride: 96 mmol/L — ABNORMAL LOW (ref 98–111)
Creatinine, Ser: 6.29 mg/dL — ABNORMAL HIGH (ref 0.44–1.00)
GFR, Estimated: 9 mL/min — ABNORMAL LOW (ref >60)
Glucose, Bld: 184 mg/dL — ABNORMAL HIGH (ref 70–99)
Potassium: 4.4 mmol/L (ref 3.5–5.1)
Sodium: 136 mmol/L (ref 135–145)
Total Bilirubin: 0.9 mg/dL (ref 0.0–1.2)
Total Protein: 6.1 g/dL — ABNORMAL LOW (ref 6.5–8.1)

## 2023-07-05 LAB — GLUCOSE, CAPILLARY
Glucose-Capillary: 58 mg/dL — ABNORMAL LOW (ref 70–99)
Glucose-Capillary: 63 mg/dL — ABNORMAL LOW (ref 70–99)
Glucose-Capillary: 92 mg/dL (ref 70–99)
Glucose-Capillary: 207 mg/dL — ABNORMAL HIGH (ref 70–99)
Glucose-Capillary: 157 mg/dL — ABNORMAL HIGH (ref 70–99)
Glucose-Capillary: 263 mg/dL — ABNORMAL HIGH (ref 70–99)
Glucose-Capillary: 447 mg/dL — ABNORMAL HIGH (ref 70–99)

## 2023-07-05 MED ORDER — NYSTATIN 100000 UNIT/GM EX CREA
TOPICAL_CREAM | Freq: Two times a day (BID) | CUTANEOUS | Status: DC
  Administered 2023-07-06 – 2023-07-07 (×2): 1 via TOPICAL
  Filled 2023-07-05 (×2): qty 30

## 2023-07-05 MED ORDER — CINACALCET HCL 30 MG PO TABS
60.0000 mg | ORAL_TABLET | ORAL | Status: DC
  Administered 2023-07-06: 60 mg via ORAL
  Filled 2023-07-05: qty 2

## 2023-07-05 MED ORDER — HYDROMORPHONE HCL 1 MG/ML IJ SOLN
0.2500 mg | INTRAMUSCULAR | Status: DC | PRN
  Administered 2023-07-05 – 2023-07-08 (×7): 0.5 mg via INTRAVENOUS
  Filled 2023-07-05 (×7): qty 0.5

## 2023-07-05 MED ORDER — INSULIN ASPART 100 UNIT/ML IJ SOLN
6.0000 [IU] | Freq: Once | INTRAMUSCULAR | Status: AC
  Administered 2023-07-05: 6 [IU] via SUBCUTANEOUS

## 2023-07-05 NOTE — Plan of Care (Signed)
 A&Ox4 VSS on RA. Independent in room.   Pt reporting new R labial vaginal swelling. MD informed and came to bedside. Assessed with this care RN. Nystatin cream prescribed  by and MD and given to pt as ordered.  Pain 7/10 prior to pain meds.  PRN Norco given x1.  Per pt, at least 3 loose BM's this shift.  PRN Immodium given x1.    BG listed below, sliding scale given per orders.    Latest Reference Range & Units 07/05/23 08:09 07/05/23 12:18 07/05/23 16:12  Glucose-Capillary 70 - 99 mg/dL 161 (H) 096 (H) 045 (H)  (H): Data is abnormally high  Problem: Pain Managment: Goal: General experience of comfort will improve and/or be controlled Outcome: Progressing   Problem: Fluid Volume: Goal: Ability to maintain a balanced intake and output will improve Outcome: Progressing

## 2023-07-05 NOTE — Progress Notes (Signed)
  Progress Note   Patient: Ashley Freeman ZOX:[id number] DOB: Aug 21, 1990 DOA: 06/30/2023     5 DOS: the patient was seen and examined on 07/05/2023   Brief hospital course: 33 year old with type 1 diabetes on insulin pump, chronic systolic heart failure dilated cardiomyopathy, ESRD on hemodialysis TTS, hypertension hyperlipidemia and history of C. difficile presented with body ache and abdominal pain for several days. Seen in the emergency room on 2/23 and discharged home. These days she is not using insulin pump. In the emergency department patient was afebrile. WBC 8.6. Today lactic acid 2.8. 3.2. Ultrasound of the gallbladder with cholelithiasis and distended thick-walled gallbladder with positive Murphy sign.   Assessment and Plan: Acute calculus cholecystitis:  -Gen Surg following. Pt is now s/p lap chole 3/4 -Continue with analgesia and anti-emetic as needed -continue on renal/carb mod diet   Type 1 diabetes with uncontrolled blood sugars:  -Presented with blood sugars 428.   -Recent A1c 10.6.   -Glucose have been very labile. Hypoglycemic overnight with scheduled insulin being held -Will decrease lantus to 8 units and cont to hold meal coverage for now -Cont SSI as needed   ESRD on hemodialysis/hyperkalemia: -Nephrology following for TTS HD -Potassium normalized   Chronic systolic heart failure, hypertension: -Patient with dilated cardiomyopathy.   -Patient previously with reduced ejection fraction, improved in the past.  Repeat echocardiogram was done 3/3 that showed ejection fraction of 25 to 30%.   -Cardiology had been following. Pt to f/u with Cardiology at [facility] on d/c. Cardiology has since signed off  Vulvular itching and swelling -Examined with female RN at bedside and pt's mother present, after getting permission by patient -RN palpated area in question with MD observing behind, shining light -Area non-erythematous with no drainage -Will give trial of nystatin    Subjective: Complaining of itching and swelling of her vulvular region  Physical Exam: Vitals:   07/04/23 1951 07/05/23 0511 07/05/23 0840 07/05/23 1620  BP: 120/72 123/73 136/83 (!) 157/85  Pulse: 85 84 83 85  Resp: 18 18 17 17   Temp: 99 F (37.2 C) 98.2 F (36.8 C) 98.8 F (37.1 C) 98.7 F (37.1 C)  TempSrc: Oral Oral Oral Oral  SpO2: 97% 97% 100% 97%  Weight:      Height:       General exam: Awake, laying in bed, in nad Respiratory system: Normal respiratory effort, no wheezing Cardiovascular system: regular rate, s1, s2 Gastrointestinal system: Soft, nondistended, positive BS Central nervous system: CN2-12 grossly intact, strength intact Extremities: Perfused, no clubbing Skin: Normal skin turgor, no erythema over vulvular region with swelling noted on R side Psychiatry: Mood normal // no visual hallucinations    Data Reviewed:  Labs reviewed: Na 136, K 4.4, Cr 6.29  Family Communication: Pt in room, family at bedside  Disposition: Status is: Inpatient Remains inpatient appropriate because: severity of illness  Planned Discharge Destination: Home    Author: Rickey Barbara, MD 07/05/2023 5:52 PM  For on call review [email or web address].

## 2023-07-05 NOTE — Plan of Care (Signed)

## 2023-07-05 NOTE — Progress Notes (Signed)
 [facility] Progress Note   Subjective:   Seen in room - abd pain improving. No CP/dyspnea. For HD tomorrow.  Objective Vitals:   07/04/23 1826 07/04/23 1951 07/05/23 0511 07/05/23 0840  BP: (!) 150/98 120/72 123/73 136/83  Pulse: 89 85 84 83  Resp: 17 18 18 17   Temp: 98.6 F (37 C) 99 F (37.2 C) 98.2 F (36.8 C) 98.8 F (37.1 C)  TempSrc: Oral Oral Oral Oral  SpO2: 100% 97% 97% 100%  Weight:      Height:       Physical Exam General: Well appearing woman, NAD. Room air Heart: RRR; no murmur Lungs: CTAB Extremities: no LE edema Dialysis Access: R AVG + t/b  Additional Objective Labs: Basic Metabolic Panel: Recent Labs  Lab 07/03/23 0634 07/04/23 0701 07/05/23 0848  NA 134* 133* 136  K 3.8 4.3 4.4  CL 99 98 96*  CO2 22 17* 23  GLUCOSE 278* 385* 184*  BUN 29* 40* 30*  CREATININE 6.98* 7.92* 6.29*  CALCIUM 7.3* 7.6* 8.4*   Liver Function Tests: Recent Labs  Lab 07/03/23 0634 07/04/23 0701 07/05/23 0848  AST 32 19 35  ALT 26 20 23   ALKPHOS 161* 138* 165*  BILITOT 0.8 1.6* 0.9  PROT 5.1* 5.2* 6.1*  ALBUMIN 2.8* 2.8* 3.1*   Recent Labs  Lab 06/30/23 0815  LIPASE 22   CBC: Recent Labs  Lab 06/30/23 0815 06/30/23 0826 07/01/23 0900 07/02/23 0735 07/02/23 1258 07/03/23 0634 07/04/23 0701  WBC 8.6  --  8.9 7.1  --  10.5 8.8  NEUTROABS 4.5  --   --   --   --   --   --   HGB 11.9*   < > 12.9 13.0 14.6 12.3 11.3*  HCT 39.6   < > 40.9 42.2 43.0 40.3 36.9  MCV 85.3  --  83.6 84.2  --  83.8 84.6  PLT 191  --  198 154  --  187 180   < > = values in this interval not displayed.   Medications:   (feeding supplement) PROSource Plus  30 mL Oral BID BM   calcitRIOL  1.25 mcg Oral Q T,Th,Sat-1800   carvedilol  25 mg Oral BID WC   Chlorhexidine Gluconate Cloth  6 each Topical Q0600   dextrose  2 Tube Oral STAT   FLUoxetine  20 mg Oral Daily   heparin  5,000 Units Subcutaneous Q8H   insulin aspart  0-6 Units Subcutaneous TID WC    insulin glargine  8 Units Subcutaneous Daily   losartan  100 mg Oral Daily   nystatin cream   Topical BID   sevelamer carbonate  2,400 mg Oral TID WC   sodium chloride flush  3 mL Intravenous Q12H    Dialysis Orders [facility].  TTS 3h 350/500 2K/2.5Ca  EDW 57.5 kg AVG 16g  Last OP HD 06/27/23 post HD wt 59.7kg  -Tums EX 750 q HD -Cinacalcet 60 mg three times per week -Calcitriol 1.25 mcg three times per week -Heparin 1600 units bolus then 600 units/hr    Assessment/Plan: 1. Acute cholecystitis - s/p lap chole with LOA. Per surgery  2. ESRD -  HD TTS. Continue on schedule. Next HD Sat 3. Hyperkalemia - On admit, resolved.  4. Hyperglycemia/T1DM - per primary  5. HTN/volume-  BP ok. Not to EDW, UF as able.  6. Anemia - Hgb >11. No ESA needs. 7. MBD - Corr Ca ok. Resume binder, VDRA,  sensipar. 8. Nutrition - Alb low, continue protein supps.   Ozzie Hoyle, PA-C 07/05/2023, 2:00 PM  [employer]

## 2023-07-05 NOTE — Progress Notes (Signed)
 3 Days Post-Op  Subjective: CC: She had to stop dialysis yesterday earlier 2/2 abdominal pain per report.   Stable areas of abdominal pain from yesterday that is described as generalized abdominal pain that is worse on her R side. Pain is overall improved and better controlled with medications. R sided abdominal pain is worse with movement. She still has worsened R sided abdominal pain when she eats solid food but feels this is better and she is able to eat more if she takes it slow and follows it with liquids. Nausea resolved. No vomiting.  BM yesterday. Voiding. Has been oob.   Afebrile. No tachycardia or hypotension. LFT's pending today.   Objective: Vital signs in last 24 hours: Temp:  [98 F (36.7 C)-99 F (37.2 C)] 98.2 F (36.8 C) (03/07 0511) Pulse Rate:  [76-89] 84 (03/07 0511) Resp:  [17-22] 18 (03/07 0511) BP: (120-150)/(71-98) 123/73 (03/07 0511) SpO2:  [95 %-100 %] 97 % (03/07 0511) Weight:  [63.6 kg-64.9 kg] 63.6 kg (03/06 1645) Last BM Date : 07/04/23  Intake/Output from previous day: 03/06 0701 - 03/07 0700 In: 480 [P.O.:480] Out: -  Intake/Output this shift: No intake/output data recorded.  PE: Gen:  Alert, NAD, pleasant Abd: Soft, at least mild distension, epigastric and ruq ttp. Appropriately tender around laparoscopic incisions. +BS. Incisions with steri-strips intact appears well and are without drainage, bleeding, or signs of infection   Lab Results:  Recent Labs    07/03/23 0634 07/04/23 0701  WBC 10.5 8.8  HGB 12.3 11.3*  HCT 40.3 36.9  PLT 187 180   BMET Recent Labs    07/03/23 0634 07/04/23 0701  NA 134* 133*  K 3.8 4.3  CL 99 98  CO2 22 17*  GLUCOSE 278* 385*  BUN 29* 40*  CREATININE 6.98* 7.92*  CALCIUM 7.3* 7.6*   PT/INR No results for input(s): "LABPROT", "INR" in the last 72 hours. CMP     Component Value Date/Time   NA 133 (L) 07/04/2023 0701   NA 136 07/13/2021 1553   NA 137 11/12/2013 0423   K 4.3 07/04/2023 0701    K 4.2 11/12/2013 0423   CL 98 07/04/2023 0701   CL 107 11/12/2013 0423   CO2 17 (L) 07/04/2023 0701   CO2 23 11/12/2013 0423   GLUCOSE 385 (H) 07/04/2023 0701   GLUCOSE 339 (H) 11/12/2013 0423   BUN 40 (H) 07/04/2023 0701   BUN 51 (H) 07/13/2021 1553   BUN 7 11/12/2013 0423   CREATININE 7.92 (H) 07/04/2023 0701   CREATININE 0.59 (L) 11/12/2013 0423   CREATININE 0.51 08/24/2013 0957   CALCIUM 7.6 (L) 07/04/2023 0701   CALCIUM 8.2 (L) 11/12/2013 0423   PROT 5.2 (L) 07/04/2023 0701   PROT 5.9 (L) 09/08/2019 0945   PROT 8.0 11/10/2013 2043   ALBUMIN 2.8 (L) 07/04/2023 0701   ALBUMIN 2.9 (L) 09/08/2019 0945   ALBUMIN 3.9 11/10/2013 2043   AST 19 07/04/2023 0701   AST 12 (L) 11/10/2013 2043   ALT 20 07/04/2023 0701   ALT 10 (L) 11/10/2013 2043   ALKPHOS 138 (H) 07/04/2023 0701   ALKPHOS 77 11/10/2013 2043   BILITOT 1.6 (H) 07/04/2023 0701   BILITOT <0.2 09/08/2019 0945   BILITOT 0.5 11/10/2013 2043   GFRNONAA 6 (L) 07/04/2023 0701   GFRNONAA >60 11/12/2013 0423   GFRNONAA >89 08/24/2013 0957   GFRAA 44 (L) 01/30/2020 1637   GFRAA >60 11/12/2013 0423   GFRAA >89 08/24/2013 1610  Lipase     Component Value Date/Time   LIPASE 22 06/30/2023 0815   LIPASE 55 (L) 09/10/2011 1356    Studies/Results: No results found.   Anti-infectives: Anti-infectives (From admission, onward)    Start     Dose/Rate Route Frequency Ordered Stop   07/01/23 1200  cefTRIAXone (ROCEPHIN) 2 g in sodium chloride 0.9 % 100 mL IVPB  Status:  Discontinued        2 g 200 mL/hr over 30 Minutes Intravenous Every 24 hours 06/30/23 1731 07/02/23 1604   06/30/23 1830  metroNIDAZOLE (FLAGYL) IVPB 500 mg  Status:  Discontinued        500 mg 100 mL/hr over 60 Minutes Intravenous Every 12 hours 06/30/23 1731 07/01/23 1137   06/30/23 1245  cefTRIAXone (ROCEPHIN) 2 g in sodium chloride 0.9 % 100 mL IVPB        2 g 200 mL/hr over 30 Minutes Intravenous  Once 06/30/23 1239 06/30/23 1402         Assessment/Plan POD 3 s/p laparoscopic cholecystectomy, loa by Dr. Dwain Sarna on 07/02/23 for acute cholecystitis  - Diet as tolerated - T. Bili up to 1.6 yesterday; Alk phos downtrending; AST/ALT wnl. Repeat today.  - Mobilize, pulm toilet  FEN - Renal VTE - SCDs, SQH ID - Rocephin peri-op. None currently. Afebrile. WBC wnl on last check.   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

## 2023-07-06 DIAGNOSIS — Z992 Dependence on renal dialysis: Secondary | ICD-10-CM | POA: Diagnosis not present

## 2023-07-06 DIAGNOSIS — K819 Cholecystitis, unspecified: Secondary | ICD-10-CM | POA: Diagnosis not present

## 2023-07-06 DIAGNOSIS — E860 Dehydration: Secondary | ICD-10-CM | POA: Diagnosis not present

## 2023-07-06 DIAGNOSIS — N186 End stage renal disease: Secondary | ICD-10-CM | POA: Diagnosis not present

## 2023-07-06 LAB — RENAL FUNCTION PANEL
Albumin: 2.7 g/dL — ABNORMAL LOW (ref 3.5–5.0)
Anion gap: 15 (ref 5–15)
BUN: 45 mg/dL — ABNORMAL HIGH (ref 6–20)
CO2: 20 mmol/L — ABNORMAL LOW (ref 22–32)
Calcium: 8.2 mg/dL — ABNORMAL LOW (ref 8.9–10.3)
Chloride: 98 mmol/L (ref 98–111)
Creatinine, Ser: 7.36 mg/dL — ABNORMAL HIGH (ref 0.44–1.00)
GFR, Estimated: 7 mL/min — ABNORMAL LOW (ref >60)
Glucose, Bld: 416 mg/dL — ABNORMAL HIGH (ref 70–99)
Phosphorus: 4.5 mg/dL (ref 2.5–4.6)
Potassium: 4.1 mmol/L (ref 3.5–5.1)
Sodium: 133 mmol/L — ABNORMAL LOW (ref 135–145)

## 2023-07-06 LAB — CBC
HCT: 35.1 % — ABNORMAL LOW (ref 36.0–46.0)
Hemoglobin: 10.6 g/dL — ABNORMAL LOW (ref 12.0–15.0)
MCH: 25.7 pg — ABNORMAL LOW (ref 26.0–34.0)
MCHC: 30.2 g/dL (ref 30.0–36.0)
MCV: 85.2 fL (ref 80.0–100.0)
Platelets: 178 10*3/uL (ref 150–400)
RBC: 4.12 MIL/uL (ref 3.87–5.11)
RDW: 16.3 % — ABNORMAL HIGH (ref 11.5–15.5)
WBC: 8.4 10*3/uL (ref 4.0–10.5)
nRBC: 0 % (ref 0.0–0.2)

## 2023-07-06 LAB — GLUCOSE, CAPILLARY
Glucose-Capillary: 110 mg/dL — ABNORMAL HIGH (ref 70–99)
Glucose-Capillary: 141 mg/dL — ABNORMAL HIGH (ref 70–99)
Glucose-Capillary: 142 mg/dL — ABNORMAL HIGH (ref 70–99)
Glucose-Capillary: 167 mg/dL — ABNORMAL HIGH (ref 70–99)
Glucose-Capillary: 408 mg/dL — ABNORMAL HIGH (ref 70–99)
Glucose-Capillary: 71 mg/dL (ref 70–99)
Glucose-Capillary: 81 mg/dL (ref 70–99)

## 2023-07-06 MED ORDER — ANTICOAGULANT SODIUM CITRATE 4% (200MG/5ML) IV SOLN: 5.0000 mL | Status: DC | PRN

## 2023-07-06 MED ORDER — HEPARIN SODIUM (PORCINE) 1000 UNIT/ML DIALYSIS: 1000.0000 [IU] | INTRAMUSCULAR | Status: DC | PRN

## 2023-07-06 MED ORDER — LIDOCAINE HCL (PF) 1 % IJ SOLN: 5.0000 mL | INTRAMUSCULAR | Status: DC | PRN

## 2023-07-06 MED ORDER — INSULIN ASPART 100 UNIT/ML IJ SOLN
0.0000 [IU] | INTRAMUSCULAR | Status: DC
  Administered 2023-07-06 – 2023-07-07 (×4): 1 [IU] via SUBCUTANEOUS
  Administered 2023-07-07: 2 [IU] via SUBCUTANEOUS
  Administered 2023-07-08 (×2): 1 [IU] via SUBCUTANEOUS
  Administered 2023-07-08: 2 [IU] via SUBCUTANEOUS

## 2023-07-06 MED ORDER — INSULIN ASPART 100 UNIT/ML IJ SOLN
3.0000 [IU] | Freq: Three times a day (TID) | INTRAMUSCULAR | Status: DC
  Administered 2023-07-06 – 2023-07-07 (×2): 3 [IU] via SUBCUTANEOUS

## 2023-07-06 MED ORDER — INSULIN ASPART 100 UNIT/ML IJ SOLN: 4.0000 [IU] | Freq: Three times a day (TID) | INTRAMUSCULAR | Status: DC

## 2023-07-06 MED ORDER — CLINDAMYCIN HCL 300 MG PO CAPS
300.0000 mg | ORAL_CAPSULE | Freq: Four times a day (QID) | ORAL | Status: DC
  Administered 2023-07-06 – 2023-07-08 (×9): 300 mg via ORAL
  Filled 2023-07-06 (×11): qty 1

## 2023-07-06 MED ORDER — ALTEPLASE 2 MG IJ SOLR: 2.0000 mg | Freq: Once | INTRAMUSCULAR | Status: DC | PRN

## 2023-07-06 MED ORDER — LIDOCAINE-PRILOCAINE 2.5-2.5 % EX CREA: 1.0000 | TOPICAL_CREAM | CUTANEOUS | Status: DC | PRN

## 2023-07-06 MED ORDER — PENTAFLUOROPROP-TETRAFLUOROETH EX AERO: 1.0000 | INHALATION_SPRAY | CUTANEOUS | Status: DC | PRN

## 2023-07-06 NOTE — Plan of Care (Signed)
  Problem: Education: Goal: Knowledge of General Education information will improve Description: Including pain rating scale, medication(s)/side effects and non-pharmacologic comfort measures Outcome: Progressing   Problem: Health Behavior/Discharge Planning: Goal: Ability to manage health-related needs will improve Outcome: Progressing   Problem: Clinical Measurements: Goal: Ability to maintain clinical measurements within normal limits will improve Outcome: Progressing Goal: Will remain free from infection Outcome: Progressing Goal: Diagnostic test results will improve Outcome: Progressing Goal: Respiratory complications will improve Outcome: Progressing Goal: Cardiovascular complication will be avoided Outcome: Progressing   Problem: Activity: Goal: Risk for activity intolerance will decrease Outcome: Progressing   Problem: Nutrition: Goal: Adequate nutrition will be maintained Outcome: Progressing   Problem: Coping: Goal: Level of anxiety will decrease Outcome: Progressing   Problem: Elimination: Goal: Will not experience complications related to bowel motility Outcome: Progressing Goal: Will not experience complications related to urinary retention Outcome: Progressing   Problem: Pain Managment: Goal: General experience of comfort will improve and/or be controlled Outcome: Progressing   Problem: Safety: Goal: Ability to remain free from injury will improve Outcome: Progressing   Problem: Skin Integrity: Goal: Risk for impaired skin integrity will decrease Outcome: Progressing   Problem: Education: Goal: Ability to describe self-care measures that may prevent or decrease complications (Diabetes Survival Skills Education) will improve Outcome: Progressing Goal: Individualized Educational Video(s) Outcome: Progressing   Problem: Coping: Goal: Ability to adjust to condition or change in health will improve Outcome: Progressing   Problem: Fluid  Volume: Goal: Ability to maintain a balanced intake and output will improve Outcome: Progressing   Problem: Health Behavior/Discharge Planning: Goal: Ability to identify and utilize available resources and services will improve Outcome: Progressing Goal: Ability to manage health-related needs will improve Outcome: Progressing   Problem: Metabolic: Goal: Ability to maintain appropriate glucose levels will improve Outcome: Progressing   Problem: Nutritional: Goal: Maintenance of adequate nutrition will improve Outcome: Progressing Goal: Progress toward achieving an optimal weight will improve Outcome: Progressing   Problem: Skin Integrity: Goal: Risk for impaired skin integrity will decrease Outcome: Progressing   Problem: Tissue Perfusion: Goal: Adequacy of tissue perfusion will improve Outcome: Progressing   Problem: Education: Goal: Ability to describe self-care measures that may prevent or decrease complications (Diabetes Survival Skills Education) will improve Outcome: Progressing Goal: Individualized Educational Video(s) Outcome: Progressing   Problem: Fluid Volume: Goal: Ability to maintain a balanced intake and output will improve Outcome: Progressing   Problem: Pain Managment: Goal: General experience of comfort will improve and/or be controlled Outcome: Progressing   Problem: Clinical Measurements: Goal: Ability to avoid or minimize complications of infection will improve Outcome: Progressing   Problem: Skin Integrity: Goal: Skin integrity will improve Outcome: Progressing

## 2023-07-06 NOTE — Progress Notes (Signed)
 Pt complaint with painful Labia (8/10). Dilaudid given at 1500, scheduled for every 4Hrs. The Pt declined NORCO and did not accept recommendations for Sitz bath or the use of warm or cold towels for comfort. She expressed her intention to rest, stating " I will just sleep"

## 2023-07-06 NOTE — Inpatient Diabetes Management (Signed)
 Inpatient Diabetes Program Recommendations  AACE/ADA: New Consensus Statement on Inpatient Glycemic Control (2015)  Target Ranges:  Prepandial:   less than 140 mg/dL      Peak postprandial:   less than 180 mg/dL (1-2 hours)      Critically ill patients:  140 - 180 mg/dL   Lab Results  Component Value Date   GLUCAP 408 (H) 07/06/2023   HGBA1C 10.6 (H) 05/21/2023    Latest Reference Range & Units 07/05/23 12:18 07/05/23 16:12 07/05/23 21:41 07/06/23 07:28  Glucose-Capillary 70 - 99 mg/dL 161 (H) 096 (H) 045 (H) 408 (H)  (H): Data is abnormally high Review of Glycemic Control  Diabetes history: type 1 Outpatient Diabetes medications: Tandem Tslim insulin pump                                                        Dexcom G7 CGM                                                         Lantus 8 units daily (off pump)                                                         Novolog 4 units TID with meals (off pump) Current orders for Inpatient glycemic control: Lantus 8 units daily, Novolog 0-6 units correction scale TID  Inpatient Diabetes Program Recommendations:   Noted that blood sugars have been greater than 400 mg/dl last night and this am.  Recommend changes in insulin regimen: Lantus 8 units Daily Novolog 0-6 units correction scale every 4 hours Novolog 3 units TID with meals if eating at least 50% of meal  Novolog correction scale every 4 hours will help to keep blood sugars checked frequently.  Will continue to monitor blood sugars while in the hospital.  Smith Mince RN BSN CDE Diabetes Coordinator Pager: [phone]  8am-5pm

## 2023-07-06 NOTE — Progress Notes (Signed)
 Pt c/o increased swelling in her right labia ad pain of 10/10. Pt stated the swelling was twice as what it was during the day shift. Pt requested Dilaudid instead of fentanyl IV for her pain. On call provider notified, PRN dilaudid ordered. Per on call provider, to give sitz bath to help with pain and swelling.

## 2023-07-06 NOTE — Progress Notes (Signed)
 [facility] Progress Note   Subjective:   Seen at start of HD - going ok so far. Denies CP/dyspnea, sleepy this AM.  Objective Vitals:   07/06/23 0839 07/06/23 0900 07/06/23 0930 07/06/23 1000  BP: (!) 156/81 (!) 148/92 (!) 149/91 (!) 148/95  Pulse: 76  81 87  Resp: 13 14 16 17   Temp:      TempSrc:      SpO2: 100% 96% 99% 99%  Weight: 62.3 kg     Height:       Physical Exam General: Well appearing woman, NAD. Room air Heart: RRR; no murmur Lungs: CTAB Extremities: no LE edema Dialysis Access: R AVG + t/b  Additional Objective Labs: Basic Metabolic Panel: Recent Labs  Lab 07/03/23 0634 07/04/23 0701 07/05/23 0848  NA 134* 133* 136  K 3.8 4.3 4.4  CL 99 98 96*  CO2 22 17* 23  GLUCOSE 278* 385* 184*  BUN 29* 40* 30*  CREATININE 6.98* 7.92* 6.29*  CALCIUM 7.3* 7.6* 8.4*   Liver Function Tests: Recent Labs  Lab 07/03/23 0634 07/04/23 0701 07/05/23 0848  AST 32 19 35  ALT 26 20 23   ALKPHOS 161* 138* 165*  BILITOT 0.8 1.6* 0.9  PROT 5.1* 5.2* 6.1*  ALBUMIN 2.8* 2.8* 3.1*   Recent Labs  Lab 06/30/23 0815  LIPASE 22   CBC: Recent Labs  Lab 06/30/23 0815 06/30/23 0826 07/01/23 0900 07/02/23 0735 07/02/23 1258 07/03/23 0634 07/04/23 0701 07/06/23 0840  WBC 8.6  --  8.9 7.1  --  10.5 8.8 8.4  NEUTROABS 4.5  --   --   --   --   --   --   --   HGB 11.9*   < > 12.9 13.0   < > 12.3 11.3* 10.6*  HCT 39.6   < > 40.9 42.2   < > 40.3 36.9 35.1*  MCV 85.3  --  83.6 84.2  --  83.8 84.6 85.2  PLT 191  --  198 154  --  187 180 178   < > = values in this interval not displayed.   Medications:   (feeding supplement) PROSource Plus  30 mL Oral BID BM   calcitRIOL  1.25 mcg Oral Q T,Th,Sat-1800   carvedilol  25 mg Oral BID WC   Chlorhexidine Gluconate Cloth  6 each Topical Q0600   cinacalcet  60 mg Oral Q T,Th,Sa-HD   clindamycin  300 mg Oral Q6H   FLUoxetine  20 mg Oral Daily   heparin  5,000 Units Subcutaneous Q8H   insulin aspart  0-6  Units Subcutaneous Q4H   insulin aspart  3 Units Subcutaneous TID WC   insulin glargine  8 Units Subcutaneous Daily   losartan  100 mg Oral Daily   nystatin cream   Topical BID   sevelamer carbonate  2,400 mg Oral TID WC   sodium chloride flush  3 mL Intravenous Q12H    Dialysis Orders [facility] .  TTS 3h 350/500 2K/2.5Ca  EDW 57.5 kg AVG 16g  Last OP HD 06/27/23 post HD wt 59.7kg  -Tums EX 750 q HD -Cinacalcet 60 mg three times per week -Calcitriol 1.25 mcg three times per week -Heparin 1600 units bolus then 600 units/hr    Assessment/Plan: 1. Acute cholecystitis - s/p lap chole with LOA. Per surgery  2. ESRD -  HD TTS. Continue on schedule - HD now. 3. Hyperkalemia - On admit, resolved.  4. Hyperglycemia/T1DM - per primary  5. HTN/volume-  BP ok. Not to EDW, UF as able.  6. Anemia - Hgb 10.6 - monitor without ESA for now. 7. MBD - Corr Ca ok. Resume binder, VDRA, sensipar. 8. Nutrition - Alb low, continue protein supps.   Ozzie Hoyle, PA-C 07/06/2023, 10:09 AM  [employer]

## 2023-07-06 NOTE — Progress Notes (Signed)
 4 Days Post-Op  Subjective: CC: Stable areas of abdominal pain from yesterday that is overall improved. Tolerating po better but still getting some worsening abdominal pain on the R and nausea.  No vomiting.  BM yesterday. Voiding.   Objective: Vital signs in last 24 hours: Temp:  [97.9 F (36.6 C)-98.7 F (37.1 C)] 97.9 F (36.6 C) (03/08 0831) Pulse Rate:  [74-87] 87 (03/08 1030) Resp:  [13-20] 19 (03/08 1030) BP: (116-157)/(73-95) 142/86 (03/08 1030) SpO2:  [92 %-100 %] 100 % (03/08 1030) Weight:  [62.3 kg] 62.3 kg (03/08 0839) Last BM Date : 07/05/23  Intake/Output from previous day: 03/07 0701 - 03/08 0700 In: 480 [P.O.:480] Out: -  Intake/Output this shift: No intake/output data recorded.  PE: Gen:  Alert, NAD, pleasant Abd: Soft, mild distension, epigastric and ruq ttp improved. Appropriately tender around laparoscopic incisions. +BS. Incisions with steri-strips intact appears well and are without drainage, bleeding, or signs of infection   Lab Results:  Recent Labs    07/04/23 0701 07/06/23 0840  WBC 8.8 8.4  HGB 11.3* 10.6*  HCT 36.9 35.1*  PLT 180 178   BMET Recent Labs    07/05/23 0848 07/06/23 0840  NA 136 133*  K 4.4 4.1  CL 96* 98  CO2 23 20*  GLUCOSE 184* 416*  BUN 30* 45*  CREATININE 6.29* 7.36*  CALCIUM 8.4* 8.2*   PT/INR No results for input(s): "LABPROT", "INR" in the last 72 hours. CMP     Component Value Date/Time   NA 133 (L) 07/06/2023 0840   NA 136 07/13/2021 1553   NA 137 11/12/2013 0423   K 4.1 07/06/2023 0840   K 4.2 11/12/2013 0423   CL 98 07/06/2023 0840   CL 107 11/12/2013 0423   CO2 20 (L) 07/06/2023 0840   CO2 23 11/12/2013 0423   GLUCOSE 416 (H) 07/06/2023 0840   GLUCOSE 339 (H) 11/12/2013 0423   BUN 45 (H) 07/06/2023 0840   BUN 51 (H) 07/13/2021 1553   BUN 7 11/12/2013 0423   CREATININE 7.36 (H) 07/06/2023 0840   CREATININE 0.59 (L) 11/12/2013 0423   CREATININE 0.51 08/24/2013 0957   CALCIUM 8.2 (L)  07/06/2023 0840   CALCIUM 8.2 (L) 11/12/2013 0423   PROT 6.1 (L) 07/05/2023 0848   PROT 5.9 (L) 09/08/2019 0945   PROT 8.0 11/10/2013 2043   ALBUMIN 2.7 (L) 07/06/2023 0840   ALBUMIN 2.9 (L) 09/08/2019 0945   ALBUMIN 3.9 11/10/2013 2043   AST 35 07/05/2023 0848   AST 12 (L) 11/10/2013 2043   ALT 23 07/05/2023 0848   ALT 10 (L) 11/10/2013 2043   ALKPHOS 165 (H) 07/05/2023 0848   ALKPHOS 77 11/10/2013 2043   BILITOT 0.9 07/05/2023 0848   BILITOT <0.2 09/08/2019 0945   BILITOT 0.5 11/10/2013 2043   GFRNONAA 7 (L) 07/06/2023 0840   GFRNONAA >60 11/12/2013 0423   GFRNONAA >89 08/24/2013 0957   GFRAA 44 (L) 01/30/2020 1637   GFRAA >60 11/12/2013 0423   GFRAA >89 08/24/2013 0957   Lipase     Component Value Date/Time   LIPASE 22 06/30/2023 0815   LIPASE 55 (L) 09/10/2011 1356    Studies/Results: No results found.   Anti-infectives: Anti-infectives (From admission, onward)    Start     Dose/Rate Route Frequency Ordered Stop   07/06/23 0830  clindamycin (CLEOCIN) capsule 300 mg        300 mg Oral Every 6 hours 07/06/23 0816 07/11/23 0559  07/01/23 1200  cefTRIAXone (ROCEPHIN) 2 g in sodium chloride 0.9 % 100 mL IVPB  Status:  Discontinued        2 g 200 mL/hr over 30 Minutes Intravenous Every 24 hours 06/30/23 1731 07/02/23 1604   06/30/23 1830  metroNIDAZOLE (FLAGYL) IVPB 500 mg  Status:  Discontinued        500 mg 100 mL/hr over 60 Minutes Intravenous Every 12 hours 06/30/23 1731 07/01/23 1137   06/30/23 1245  cefTRIAXone (ROCEPHIN) 2 g in sodium chloride 0.9 % 100 mL IVPB        2 g 200 mL/hr over 30 Minutes Intravenous  Once 06/30/23 1239 06/30/23 1402        Assessment/Plan POD 4 s/p laparoscopic cholecystectomy, loa by Dr. Dwain Sarna on 07/02/23 for acute cholecystitis  - Diet as tolerated - T. Bili normalized on yesterday's labs. Alk Phos up at 165. AST/ALT wnl.  - Mobilize, pulm toilet  FEN - Renal VTE - SCDs, SQH ID - Rocephin peri-op. None currently.  Afebrile. WBC wnl on last check.   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

## 2023-07-06 NOTE — Progress Notes (Signed)
 Pt blood glucose was 447 at 2141, on call provider notified, one time order received for 6 units of  insulin aspart.

## 2023-07-06 NOTE — Progress Notes (Signed)
 Received patient in bed to unit.  Alert and oriented.  Informed consent signed and in chart.   TX duration: 3.5  Patient tolerated well.  Transported back to the room  Alert, without acute distress.  Hand-off given to patient's nurse.   Access used: Left AVF Access issues: No  Total UF removed: 3 Medication(s) given: See [facility]   07/06/23 1321  Vitals  Temp 98.1 F (36.7 C)  Pulse Rate 88  Resp 13  BP (!) 140/96  SpO2 100 %  Weight 59.3 kg  Type of Weight Post-Dialysis  Oxygen Therapy  Patient Activity (if Appropriate) In bed  Pulse Oximetry Type Continuous

## 2023-07-06 NOTE — Progress Notes (Signed)
  Progress Note   Patient: Ashley Freeman ZOX:[id number] DOB: 1990/06/18 DOA: 06/30/2023     6 DOS: the patient was seen and examined on 07/06/2023   Brief hospital course: 33 year old with type 1 diabetes on insulin pump, chronic systolic heart failure dilated cardiomyopathy, ESRD on hemodialysis TTS, hypertension hyperlipidemia and history of C. difficile presented with body ache and abdominal pain for several days. Seen in the emergency room on 2/23 and discharged home. These days she is not using insulin pump. In the emergency department patient was afebrile. WBC 8.6. Today lactic acid 2.8. 3.2. Ultrasound of the gallbladder with cholelithiasis and distended thick-walled gallbladder with positive Murphy sign.   Assessment and Plan: Acute calculus cholecystitis:  -Gen Surg following. Pt is now s/p lap chole 3/4 -Continue with analgesia and anti-emetic as needed -continue on renal/carb mod diet   Type 1 diabetes with uncontrolled blood sugars:  -Continues with labile glucose, either hyer or hypoglycemic -Recent A1c 10.6.   -Now on semglee 8 units and have resumed meal coverage to 3 units per diabetic coordinator recs -changed SSI to q4h   ESRD on hemodialysis/hyperkalemia: -Nephrology following for TTS HD -Potassium normalized   Chronic systolic heart failure, hypertension: -Patient with dilated cardiomyopathy.   -Patient previously with reduced ejection fraction, improved in the past.  Repeat echocardiogram was done 3/3 that showed ejection fraction of 25 to 30%.   -Cardiology had been following. Pt to f/u with Cardiology at [facility] on d/c. Cardiology has since signed off  Vulvular itching and swelling and pain -Recently noted to have area of non-erythematous, tender, non-draining swelling over R labia, now worse overnight -Have started empiric clinda -Afebrile. No leukocytosis -Have discussed with GYN who will see pt in consultation   Subjective: States pain and swelling of  vulvar region has worsened overnight  Physical Exam: Vitals:   07/06/23 1200 07/06/23 1230 07/06/23 1321 07/06/23 1431  BP: (!) 148/91 (!) 143/92 (!) 140/96 (!) 150/89  Pulse: 89 93 88 87  Resp: 14 16 13 16   Temp:   98.1 F (36.7 C)   TempSrc:      SpO2: 99% 100% 100% 98%  Weight:   59.3 kg   Height:       General exam: Conversant, in no acute distress Respiratory system: normal chest rise, clear, no audible wheezing Cardiovascular system: regular rhythm, s1-s2 Gastrointestinal system: Nondistended, nontender, pos BS Central nervous system: No seizures, no tremors Extremities: No cyanosis, no joint deformities Skin: No rashes, no pallor Psychiatry: Affect normal // no auditory hallucinations   Data Reviewed:  Labs reviewed: Na 133, K 4.1, Cr 7.36, WBC 8.4, Hgb 10.6, Plts 178  Family Communication: Pt in room, family at bedside  Disposition: Status is: Inpatient Remains inpatient appropriate because: severity of illness  Planned Discharge Destination: Home    Author: Rickey Barbara, MD 07/06/2023 2:49 PM  For on call review [email or web address].

## 2023-07-07 DIAGNOSIS — N186 End stage renal disease: Secondary | ICD-10-CM | POA: Diagnosis not present

## 2023-07-07 DIAGNOSIS — Z992 Dependence on renal dialysis: Secondary | ICD-10-CM | POA: Diagnosis not present

## 2023-07-07 DIAGNOSIS — K819 Cholecystitis, unspecified: Secondary | ICD-10-CM | POA: Diagnosis not present

## 2023-07-07 DIAGNOSIS — E860 Dehydration: Secondary | ICD-10-CM | POA: Diagnosis not present

## 2023-07-07 LAB — COMPREHENSIVE METABOLIC PANEL
ALT: 32 U/L (ref 0–44)
AST: 68 U/L — ABNORMAL HIGH (ref 15–41)
Albumin: 2.7 g/dL — ABNORMAL LOW (ref 3.5–5.0)
Alkaline Phosphatase: 204 U/L — ABNORMAL HIGH (ref 38–126)
Anion gap: 10 (ref 5–15)
BUN: 27 mg/dL — ABNORMAL HIGH (ref 6–20)
CO2: 26 mmol/L (ref 22–32)
Calcium: 8.3 mg/dL — ABNORMAL LOW (ref 8.9–10.3)
Chloride: 98 mmol/L (ref 98–111)
Creatinine, Ser: 5.06 mg/dL — ABNORMAL HIGH (ref 0.44–1.00)
GFR, Estimated: 11 mL/min — ABNORMAL LOW (ref >60)
Glucose, Bld: 141 mg/dL — ABNORMAL HIGH (ref 70–99)
Potassium: 4 mmol/L (ref 3.5–5.1)
Sodium: 134 mmol/L — ABNORMAL LOW (ref 135–145)
Total Bilirubin: 0.8 mg/dL (ref 0.0–1.2)
Total Protein: 5.6 g/dL — ABNORMAL LOW (ref 6.5–8.1)

## 2023-07-07 LAB — GLUCOSE, CAPILLARY
Glucose-Capillary: 193 mg/dL — ABNORMAL HIGH (ref 70–99)
Glucose-Capillary: 153 mg/dL — ABNORMAL HIGH (ref 70–99)
Glucose-Capillary: 52 mg/dL — ABNORMAL LOW (ref 70–99)
Glucose-Capillary: 180 mg/dL — ABNORMAL HIGH (ref 70–99)
Glucose-Capillary: 214 mg/dL — ABNORMAL HIGH (ref 70–99)
Glucose-Capillary: 192 mg/dL — ABNORMAL HIGH (ref 70–99)
Glucose-Capillary: 357 mg/dL — ABNORMAL HIGH (ref 70–99)

## 2023-07-07 LAB — CBC
HCT: 36.2 % (ref 36.0–46.0)
Hemoglobin: 11.2 g/dL — ABNORMAL LOW (ref 12.0–15.0)
MCH: 25.5 pg — ABNORMAL LOW (ref 26.0–34.0)
MCHC: 30.9 g/dL (ref 30.0–36.0)
MCV: 82.5 fL (ref 80.0–100.0)
Platelets: 176 10*3/uL (ref 150–400)
RBC: 4.39 MIL/uL (ref 3.87–5.11)
RDW: 16.2 % — ABNORMAL HIGH (ref 11.5–15.5)
WBC: 9.1 10*3/uL (ref 4.0–10.5)
nRBC: 0 % (ref 0.0–0.2)

## 2023-07-07 MED ORDER — INSULIN ASPART 100 UNIT/ML IJ SOLN
2.0000 [IU] | Freq: Three times a day (TID) | INTRAMUSCULAR | Status: DC
  Administered 2023-07-07 – 2023-07-08 (×3): 2 [IU] via SUBCUTANEOUS

## 2023-07-07 MED ORDER — HYDROCODONE-ACETAMINOPHEN 5-325 MG PO TABS
1.0000 | ORAL_TABLET | Freq: Four times a day (QID) | ORAL | 0 refills | Status: DC | PRN
Start: 2023-07-07 — End: 2023-07-16

## 2023-07-07 MED ORDER — DEXTROSE 50 % IV SOLN
INTRAVENOUS | Status: AC
Start: 2023-07-07 — End: 2023-07-07
  Administered 2023-07-07: 50 mL
  Filled 2023-07-07: qty 50

## 2023-07-07 NOTE — Progress Notes (Signed)
 [facility] Progress Note   Subjective:   Seen in room - feels ok. Abd improving. Per notes, now dealing with labial infection? HD went ok yesterday - 3L removed.  Objective Vitals:   07/06/23 1431 07/06/23 2005 07/07/23 0431 07/07/23 0838  BP: (!) 150/89 138/87 131/77 122/79  Pulse: 87 87 79 85  Resp: 16 16 16 16   Temp:  98.2 F (36.8 C) 98.2 F (36.8 C)   TempSrc:  Oral Oral   SpO2: 98% 96% 95% 98%  Weight:      Height:       Physical Exam General: Well appearing woman, NAD. Room air Heart: RRR; no murmur Lungs: CTAB Extremities: no LE edema Dialysis Access: R AVG + t/b  Additional Objective Labs: Basic Metabolic Panel: Recent Labs  Lab 07/05/23 0848 07/06/23 0840 07/07/23 0949  NA 136 133* 134*  K 4.4 4.1 4.0  CL 96* 98 98  CO2 23 20* 26  GLUCOSE 184* 416* 141*  BUN 30* 45* 27*  CREATININE 6.29* 7.36* 5.06*  CALCIUM 8.4* 8.2* 8.3*  PHOS  --  4.5  --    Liver Function Tests: Recent Labs  Lab 07/04/23 0701 07/05/23 0848 07/06/23 0840 07/07/23 0949  AST 19 35  --  68*  ALT 20 23  --  32  ALKPHOS 138* 165*  --  204*  BILITOT 1.6* 0.9  --  0.8  PROT 5.2* 6.1*  --  5.6*  ALBUMIN 2.8* 3.1* 2.7* 2.7*   CBC: Recent Labs  Lab 07/02/23 0735 07/02/23 1258 07/03/23 0634 07/04/23 0701 07/06/23 0840 07/07/23 0949  WBC 7.1  --  10.5 8.8 8.4 9.1  HGB 13.0   < > 12.3 11.3* 10.6* 11.2*  HCT 42.2   < > 40.3 36.9 35.1* 36.2  MCV 84.2  --  83.8 84.6 85.2 82.5  PLT 154  --  187 180 178 176   < > = values in this interval not displayed.   Medications:  anticoagulant sodium citrate      (feeding supplement) PROSource Plus  30 mL Oral BID BM   calcitRIOL  1.25 mcg Oral Q T,Th,Sat-1800   carvedilol  25 mg Oral BID WC   Chlorhexidine Gluconate Cloth  6 each Topical Q0600   cinacalcet  60 mg Oral Q T,Th,Sa-HD   clindamycin  300 mg Oral Q6H   FLUoxetine  20 mg Oral Daily   heparin  5,000 Units Subcutaneous Q8H   insulin aspart  0-6 Units  Subcutaneous Q4H   insulin aspart  3 Units Subcutaneous TID WC   insulin glargine  8 Units Subcutaneous Daily   losartan  100 mg Oral Daily   nystatin cream   Topical BID   sevelamer carbonate  2,400 mg Oral TID WC   sodium chloride flush  3 mL Intravenous Q12H    Dialysis Orders [facility].  TTS 3h 350/500 2K/2.5Ca  EDW 57.5 kg AVG 16g  Last OP HD 06/27/23 post HD wt 59.7kg  -Tums EX 750 q HD -Cinacalcet 60 mg three times per week -Calcitriol 1.25 mcg three times per week -Heparin 1600 units bolus then 600 units/hr    Assessment/Plan: 1. Acute cholecystitis - s/p lap chole with LOA. Per surgery  2. ESRD -  HD TTS. Continue on schedule - next HD Tues 3/11 3. Hyperkalemia - On admit, resolved.  4. Hyperglycemia/T1DM - per primary  5. HTN/volume-  BP ok. Not to EDW, UF as able.  6. Anemia - Hgb  11.2 - monitor without ESA for now. 7. MBD - Corr Ca ok. Continue binder, VDRA, sensipar. 8. Nutrition - Alb low, continue protein supps. 9. Vulvar/labial infection: On clinda, per primary.   Ozzie Hoyle, PA-C 07/07/2023, 12:14 PM  [employer]

## 2023-07-07 NOTE — Plan of Care (Signed)
  Problem: Education: Goal: Knowledge of General Education information will improve Description: Including pain rating scale, medication(s)/side effects and non-pharmacologic comfort measures Outcome: Progressing   Problem: Health Behavior/Discharge Planning: Goal: Ability to manage health-related needs will improve Outcome: Progressing   Problem: Clinical Measurements: Goal: Ability to maintain clinical measurements within normal limits will improve Outcome: Progressing Goal: Will remain free from infection Outcome: Progressing Goal: Diagnostic test results will improve Outcome: Progressing Goal: Respiratory complications will improve Outcome: Progressing Goal: Cardiovascular complication will be avoided Outcome: Progressing   Problem: Activity: Goal: Risk for activity intolerance will decrease Outcome: Progressing   Problem: Nutrition: Goal: Adequate nutrition will be maintained Outcome: Progressing   Problem: Coping: Goal: Level of anxiety will decrease Outcome: Progressing   Problem: Elimination: Goal: Will not experience complications related to bowel motility Outcome: Progressing Goal: Will not experience complications related to urinary retention Outcome: Progressing   Problem: Pain Managment: Goal: General experience of comfort will improve and/or be controlled Outcome: Progressing   Problem: Safety: Goal: Ability to remain free from injury will improve Outcome: Progressing   Problem: Skin Integrity: Goal: Risk for impaired skin integrity will decrease Outcome: Progressing   Problem: Education: Goal: Ability to describe self-care measures that may prevent or decrease complications (Diabetes Survival Skills Education) will improve Outcome: Progressing Goal: Individualized Educational Video(s) Outcome: Progressing   Problem: Coping: Goal: Ability to adjust to condition or change in health will improve Outcome: Progressing   Problem: Fluid  Volume: Goal: Ability to maintain a balanced intake and output will improve Outcome: Progressing   Problem: Health Behavior/Discharge Planning: Goal: Ability to identify and utilize available resources and services will improve Outcome: Progressing Goal: Ability to manage health-related needs will improve Outcome: Progressing   Problem: Metabolic: Goal: Ability to maintain appropriate glucose levels will improve Outcome: Progressing   Problem: Nutritional: Goal: Maintenance of adequate nutrition will improve Outcome: Progressing Goal: Progress toward achieving an optimal weight will improve Outcome: Progressing   Problem: Skin Integrity: Goal: Risk for impaired skin integrity will decrease Outcome: Progressing   Problem: Tissue Perfusion: Goal: Adequacy of tissue perfusion will improve Outcome: Progressing   Problem: Education: Goal: Ability to describe self-care measures that may prevent or decrease complications (Diabetes Survival Skills Education) will improve Outcome: Progressing Goal: Individualized Educational Video(s) Outcome: Progressing   Problem: Fluid Volume: Goal: Ability to maintain a balanced intake and output will improve Outcome: Progressing   Problem: Pain Managment: Goal: General experience of comfort will improve and/or be controlled Outcome: Progressing   Problem: Clinical Measurements: Goal: Ability to avoid or minimize complications of infection will improve Outcome: Progressing   Problem: Skin Integrity: Goal: Skin integrity will improve Outcome: Progressing

## 2023-07-07 NOTE — Discharge Instructions (Signed)
[facility] LAPAROSCOPIC SURGERY: POST OP INSTRUCTIONS  Always review your discharge instruction sheet given to you by the facility where your surgery was performed. IF YOU HAVE DISABILITY OR FAMILY LEAVE FORMS, YOU MUST BRING THEM TO THE OFFICE FOR PROCESSING.   DO NOT GIVE THEM TO YOUR DOCTOR.  A prescription for pain medication may be given to you upon discharge.  Take your pain medication as prescribed, if needed.  If narcotic pain medicine is not needed, then you may take acetaminophen (Tylenol), naprosyn (Alleve), or ibuprofen (Advil) as needed. Take your usually prescribed medications unless otherwise directed. If you need a refill on your pain medication, please contact your pharmacy.  They will contact our office to request authorization. Prescriptions will not be filled after 5pm or on week-ends. You should follow a light diet the first few days after arrival home, such as soup and crackers, etc.  Be sure to include lots of fluids daily. Most patients will experience some swelling and bruising in the area of the incisions.  Ice packs will help.  Swelling and bruising can take several days to resolve.  It is common to experience some constipation if taking pain medication after surgery.  Increasing fluid intake and taking a stool softener (such as Colace) will usually help or prevent this problem from occurring.  A mild laxative (Milk of Magnesia or Miralax) should be taken according to package instructions if there are no bowel movements after 48 hours. Unless discharge instructions indicate otherwise, you may remove your bandages 48 hours after surgery, and you may shower at that time.  You may have steri-strips (small skin tapes) in place directly over the incision.  These strips should be left on the skin for 7-10 days.  If your surgeon used skin glue on the incision, you may shower in 24 hours.  The glue will flake off over the next  2-3 weeks.  Any sutures or staples will be removed at the office during your follow-up visit. ACTIVITIES:  You may resume regular (light) daily activities beginning the next day--such as daily self-care, walking, climbing stairs--gradually increasing activities as tolerated.  You may have sexual intercourse when it is comfortable.  Refrain from any heavy lifting or straining until approved by your doctor. You may drive when you are no longer taking prescription pain medication, you can comfortably wear a seatbelt, and you can safely maneuver your car and apply brakes. RETURN TO WORK:  __________________________________________________________ You should see your doctor in the office for a follow-up appointment approximately 2-3 weeks after your surgery.  Make sure that you call for this appointment within a day or two after you arrive home to insure a convenient appointment time. OTHER INSTRUCTIONS: __________________________________________________________________________________________________________________________ __________________________________________________________________________________________________________________________ WHEN TO CALL YOUR DOCTOR: Fever over 101.0 Inability to urinate Continued bleeding from incision. Increased pain, redness, or drainage from the incision. Increasing abdominal pain  The clinic staff is available to answer your questions during regular business hours.  Please don't hesitate to call and ask to speak to one of the nurses for clinical concerns.  If you have a medical emergency, go to the nearest emergency room or call 911.  A surgeon from [facility] is always on call at the hospital. [address], [address], [place], [place]  [address] ? [address] [address], [place], [place]   [address] [phone] ? [phone] ? FAX [phone] Web site: [email or web address]  

## 2023-07-07 NOTE — Consult Note (Signed)
 Reason for Consult:vulvar swelling Referring Physician: Johnna Acosta MD  Ashley Freeman is an 33 y.o. female.  [id number] No LMP recorded (lmp unknown). (Menstrual status: Irregular Periods). She is amenorrheic using Depo Provera. She states she has had a vulvar abscess drained in the past. She is in hospital for diabetes and renal insufficiency/failure and has dialysis yesterday. Right vulvar swelling and pain is reported and this may have improved since yesterday Pertinent Gynecological History: Menses:  amenorrhea with depo provera Bleeding: none Contraception: depo provera last injection 04/16/2023 Blood transfusions: none Previous GYN Procedures:  I&D vulvar abscess    Last pap: abnormal: HR HPV pos  Date: 08/16/2021 [id number]    Menstrual History:  No LMP recorded (lmp unknown). (Menstrual status: Irregular Periods).    Past Medical History:  Diagnosis Date   Abscess, gluteal, right 08/24/2013   Anemia 02/19/2012   Bartholin's gland abscess 09/19/2013   Bipolar disorder (HCC)    BV (bacterial vaginosis) 11/24/2015   Depression    Diabetes mellitus type I (HCC) 2001   Diagnosed at age 71 ; Type I   Diarrhea 05/30/2016   DKA (diabetic ketoacidoses) 08/19/2013   Also in 2018   ESRD (end stage renal disease) (HCC)    Gonorrhea 08/2011   Treated in 09/2011   HFrEF (heart failure with reduced ejection fraction) (HCC)    a. 2022 Echo: EF 40%; b. 10/2021 Echo: EF 55%; b. 07/2022 MV: No ischemia. EF 31%; c. 08/2022 Echo: EF 35%, mildly dil RV, sev TR.   History of trichomoniasis 05/31/2016   Hyperlipidemia 03/28/2016   Hypertension    NICM (nonischemic cardiomyopathy) (HCC)    Sepsis (HCC) 09/19/2013    Past Surgical History:  Procedure Laterality Date   A/V FISTULAGRAM Right 06/17/2023   Procedure: A/V Fistulagram;  Surgeon: Annice Needy, MD;  Location: [facility] INVASIVE CV LAB;  Service: Cardiovascular;  Laterality: Right;   AV FISTULA PLACEMENT Right 07/06/2022    Procedure: ARTERIOVENOUS GRAFT CREATION;  Surgeon: Cephus Shelling, MD;  Location: [facility] OR;  Service: Vascular;  Laterality: Right;   CESAREAN SECTION N/A 10/05/2019   Procedure: CESAREAN SECTION;  Surgeon: Statham Bing, MD;  Location: [facility] LD ORS;  Service: Obstetrics;  Laterality: N/A;   CHOLECYSTECTOMY N/A 07/02/2023   Procedure: LAPAROSCOPIC CHOLECYSTECTOMY;  Surgeon: Emelia Loron, MD;  Location: [facility] OR;  Service: General;  Laterality: N/A;   INCISION AND DRAINAGE ABSCESS Left 09/28/2019   Procedure: INCISION AND DRAINAGE VULVAR ABCESS;  Surgeon: Tilda Burrow, MD;  Location: [facility] OR;  Service: Gynecology;  Laterality: Left;   INCISION AND DRAINAGE PERIRECTAL ABSCESS Right 08/18/2013   Procedure: IRRIGATION AND DEBRIDEMENT GLUTEAL ABSCESS;  Surgeon: Axel Filler, MD;  Location: [facility] OR;  Service: General;  Laterality: Right;   INCISION AND DRAINAGE PERIRECTAL ABSCESS Right 09/19/2013   Procedure: IRRIGATION AND DEBRIDEMENT RIGHT GLUTEAL AND LABIAL ABSCESSES;  Surgeon: Axel Filler, MD;  Location: [facility] OR;  Service: General;  Laterality: Right;   INCISION AND DRAINAGE PERIRECTAL ABSCESS Right 09/24/2013   Procedure: IRRIGATION AND DEBRIDEMENT PERIRECTAL ABSCESS;  Surgeon: Cherylynn Ridges, MD;  Location: [facility] OR;  Service: General;  Laterality: Right;    Family History  Problem Relation Age of Onset   Asthma Mother    Carpal tunnel syndrome Mother    Gout Father    Diabetes Paternal Grandmother    Anesthesia problems Neg Hx     Social History:  reports that she has never smoked. She has never been exposed to tobacco smoke.  She has never used smokeless tobacco. She reports that she does not currently use alcohol. She reports that she does not use drugs.  Allergies:  Allergies  Allergen Reactions   Cephalexin Anaphylaxis    Has gotten ceftriaxone in the past    Morphine Itching   Penicillins Hives and Rash   Benadryl [Diphenhydramine] Itching   Doxycycline Itching   Methotrexate  Derivatives Rash    Medications: I have reviewed the patient's current medications.  Review of Systems  Genitourinary:  Positive for vaginal pain (right vulvar swelling and tenderness). Negative for menstrual problem, vaginal bleeding and vaginal discharge.    Blood pressure (!) 140/89, pulse 80, temperature 98.4 F (36.9 C), temperature source Oral, resp. rate 16, height 5\' 3"  (1.6 m), weight 59.3 kg, SpO2 98%, unknown if currently breastfeeding. Physical Exam Exam conducted with a chaperone present.  Constitutional:      General: She is not in acute distress. HENT:     Head: Normocephalic and atraumatic.  Cardiovascular:     Rate and Rhythm: Normal rate.  Pulmonary:     Effort: Pulmonary effort is normal.  Genitourinary:    Exam position: Lithotomy position.       Comments: Mild swelling on right labium major with no mass or abscess, mild tenderness. Vulva is soft and no erythema or skin changes. Left side normal, no vaginal discharge Skin:    General: Skin is warm and dry.  Neurological:     Mental Status: She is alert.  Psychiatric:        Mood and Affect: Mood normal.        Behavior: Behavior normal.     Results for orders placed or performed during the hospital encounter of 06/30/23 (from the past 48 hours)  Glucose, capillary     Status: Abnormal   Collection Time: 07/05/23  4:12 PM  Result Value Ref Range   Glucose-Capillary 263 (H) 70 - 99 mg/dL    Comment: Glucose reference range applies only to samples taken after fasting for at least 8 hours.  Glucose, capillary     Status: Abnormal   Collection Time: 07/05/23  9:41 PM  Result Value Ref Range   Glucose-Capillary 447 (H) 70 - 99 mg/dL    Comment: Glucose reference range applies only to samples taken after fasting for at least 8 hours.  Glucose, capillary     Status: Abnormal   Collection Time: 07/06/23  7:28 AM  Result Value Ref Range   Glucose-Capillary 408 (H) 70 - 99 mg/dL    Comment: Glucose  reference range applies only to samples taken after fasting for at least 8 hours.  Renal function panel     Status: Abnormal   Collection Time: 07/06/23  8:40 AM  Result Value Ref Range   Sodium 133 (L) 135 - 145 mmol/L   Potassium 4.1 3.5 - 5.1 mmol/L   Chloride 98 98 - 111 mmol/L   CO2 20 (L) 22 - 32 mmol/L   Glucose, Bld 416 (H) 70 - 99 mg/dL    Comment: Glucose reference range applies only to samples taken after fasting for at least 8 hours.   BUN 45 (H) 6 - 20 mg/dL   Creatinine, Ser 2.95 (H) 0.44 - 1.00 mg/dL   Calcium 8.2 (L) 8.9 - 10.3 mg/dL   Phosphorus 4.5 2.5 - 4.6 mg/dL   Albumin 2.7 (L) 3.5 - 5.0 g/dL   GFR, Estimated 7 (L) >60 mL/min    Comment: (NOTE) Calculated using the  CKD-EPI Creatinine Equation (2021)    Anion gap 15 5 - 15    Comment: Performed at [facility], [address] [place], [place] [address]  CBC     Status: Abnormal   Collection Time: 07/06/23  8:40 AM  Result Value Ref Range   WBC 8.4 4.0 - 10.5 K/uL   RBC 4.12 3.87 - 5.11 MIL/uL   Hemoglobin 10.6 (L) 12.0 - 15.0 g/dL   HCT 78.4 (L) 69.6 - 29.5 %   MCV 85.2 80.0 - 100.0 fL   MCH 25.7 (L) 26.0 - 34.0 pg   MCHC 30.2 30.0 - 36.0 g/dL   RDW 28.4 (H) 13.2 - 44.0 %   Platelets 178 150 - 400 K/uL   nRBC 0.0 0.0 - 0.2 %    Comment: Performed at [facility], [address], [place], [place] [address]  Glucose, capillary     Status: Abnormal   Collection Time: 07/06/23  2:25 PM  Result Value Ref Range   Glucose-Capillary 142 (H) 70 - 99 mg/dL    Comment: Glucose reference range applies only to samples taken after fasting for at least 8 hours.  Glucose, capillary     Status: Abnormal   Collection Time: 07/06/23  4:10 PM  Result Value Ref Range   Glucose-Capillary 141 (H) 70 - 99 mg/dL    Comment: Glucose reference range applies only to samples taken after fasting for at least 8 hours.   Comment 1 Notify RN   Glucose, capillary     Status: None   Collection Time: 07/06/23  6:33 PM   Result Value Ref Range   Glucose-Capillary 81 70 - 99 mg/dL    Comment: Glucose reference range applies only to samples taken after fasting for at least 8 hours.  Glucose, capillary     Status: None   Collection Time: 07/06/23  8:05 PM  Result Value Ref Range   Glucose-Capillary 71 70 - 99 mg/dL    Comment: Glucose reference range applies only to samples taken after fasting for at least 8 hours.  Glucose, capillary     Status: Abnormal   Collection Time: 07/06/23  9:50 PM  Result Value Ref Range   Glucose-Capillary 110 (H) 70 - 99 mg/dL    Comment: Glucose reference range applies only to samples taken after fasting for at least 8 hours.  Glucose, capillary     Status: Abnormal   Collection Time: 07/06/23 11:32 PM  Result Value Ref Range   Glucose-Capillary 167 (H) 70 - 99 mg/dL    Comment: Glucose reference range applies only to samples taken after fasting for at least 8 hours.  Glucose, capillary     Status: Abnormal   Collection Time: 07/07/23  4:08 AM  Result Value Ref Range   Glucose-Capillary 193 (H) 70 - 99 mg/dL    Comment: Glucose reference range applies only to samples taken after fasting for at least 8 hours.  Glucose, capillary     Status: Abnormal   Collection Time: 07/07/23  8:37 AM  Result Value Ref Range   Glucose-Capillary 153 (H) 70 - 99 mg/dL    Comment: Glucose reference range applies only to samples taken after fasting for at least 8 hours.  Comprehensive metabolic panel     Status: Abnormal   Collection Time: 07/07/23  9:49 AM  Result Value Ref Range   Sodium 134 (L) 135 - 145 mmol/L   Potassium 4.0 3.5 - 5.1 mmol/L   Chloride  98 98 - 111 mmol/L   CO2 26 22 - 32 mmol/L   Glucose, Bld 141 (H) 70 - 99 mg/dL    Comment: Glucose reference range applies only to samples taken after fasting for at least 8 hours.   BUN 27 (H) 6 - 20 mg/dL   Creatinine, Ser 9.52 (H) 0.44 - 1.00 mg/dL   Calcium 8.3 (L) 8.9 - 10.3 mg/dL   Total Protein 5.6 (L) 6.5 - 8.1 g/dL    Albumin 2.7 (L) 3.5 - 5.0 g/dL   AST 68 (H) 15 - 41 U/L   ALT 32 0 - 44 U/L   Alkaline Phosphatase 204 (H) 38 - 126 U/L   Total Bilirubin 0.8 0.0 - 1.2 mg/dL   GFR, Estimated 11 (L) >60 mL/min    Comment: (NOTE) Calculated using the CKD-EPI Creatinine Equation (2021)    Anion gap 10 5 - 15    Comment: Performed at [facility], [address], [place], [place] [address]  CBC     Status: Abnormal   Collection Time: 07/07/23  9:49 AM  Result Value Ref Range   WBC 9.1 4.0 - 10.5 K/uL   RBC 4.39 3.87 - 5.11 MIL/uL   Hemoglobin 11.2 (L) 12.0 - 15.0 g/dL   HCT 44.0 10.2 - 72.5 %   MCV 82.5 80.0 - 100.0 fL   MCH 25.5 (L) 26.0 - 34.0 pg   MCHC 30.9 30.0 - 36.0 g/dL   RDW 36.6 (H) 44.0 - 34.7 %   Platelets 176 150 - 400 K/uL   nRBC 0.0 0.0 - 0.2 %    Comment: Performed at [facility], [address], [place], [place] [address]  Glucose, capillary     Status: Abnormal   Collection Time: 07/07/23 12:13 PM  Result Value Ref Range   Glucose-Capillary 52 (L) 70 - 99 mg/dL    Comment: Glucose reference range applies only to samples taken after fasting for at least 8 hours.  Glucose, capillary     Status: Abnormal   Collection Time: 07/07/23 12:52 PM  Result Value Ref Range   Glucose-Capillary 180 (H) 70 - 99 mg/dL    Comment: Glucose reference range applies only to samples taken after fasting for at least 8 hours.    No results found.  Assessment/Plan: Right labial edema with no source of infection or injury noted. This has improved over 24 per patient and her nurse. We can observe. Continue present antibiotic as this could be effective for a possible skin or soft tissue infection. After discharge she should follow with her Gyn in [facility] and she needs her depo provera soon.  Scheryl Darter 07/07/2023

## 2023-07-07 NOTE — Progress Notes (Signed)
 5 Days Post-Op   Subjective/Chief Complaint: Continues to improve post surgery, tol diet, only pain at one site   Objective: Vital signs in last 24 hours: Temp:  [97.9 F (36.6 C)-98.2 F (36.8 C)] 98.2 F (36.8 C) (03/09 0431) Pulse Rate:  [76-93] 79 (03/09 0431) Resp:  [13-22] 16 (03/09 0431) BP: (131-157)/(77-106) 131/77 (03/09 0431) SpO2:  [95 %-100 %] 95 % (03/09 0431) Weight:  [59.3 kg-62.3 kg] 59.3 kg (03/08 1321) Last BM Date : 07/06/23  Intake/Output from previous day: No intake/output data recorded. Intake/Output this shift: No intake/output data recorded.  Ab soft approp tender nondistended incisoins clean  Lab Results:  Recent Labs    07/06/23 0840  WBC 8.4  HGB 10.6*  HCT 35.1*  PLT 178   BMET Recent Labs    07/05/23 0848 07/06/23 0840  NA 136 133*  K 4.4 4.1  CL 96* 98  CO2 23 20*  GLUCOSE 184* 416*  BUN 30* 45*  CREATININE 6.29* 7.36*  CALCIUM 8.4* 8.2*   PT/INR No results for input(s): "LABPROT", "INR" in the last 72 hours. ABG No results for input(s): "PHART", "HCO3" in the last 72 hours.  Invalid input(s): "PCO2", "PO2"  Studies/Results: No results found.  Anti-infectives: Anti-infectives (From admission, onward)    Start     Dose/Rate Route Frequency Ordered Stop   07/06/23 0830  clindamycin (CLEOCIN) capsule 300 mg        300 mg Oral Every 6 hours 07/06/23 0816 07/11/23 0559   07/01/23 1200  cefTRIAXone (ROCEPHIN) 2 g in sodium chloride 0.9 % 100 mL IVPB  Status:  Discontinued        2 g 200 mL/hr over 30 Minutes Intravenous Every 24 hours 06/30/23 1731 07/02/23 1604   06/30/23 1830  metroNIDAZOLE (FLAGYL) IVPB 500 mg  Status:  Discontinued        500 mg 100 mL/hr over 60 Minutes Intravenous Every 12 hours 06/30/23 1731 07/01/23 1137   06/30/23 1245  cefTRIAXone (ROCEPHIN) 2 g in sodium chloride 0.9 % 100 mL IVPB        2 g 200 mL/hr over 30 Minutes Intravenous  Once 06/30/23 1239 06/30/23 1402        Assessment/Plan: POD 5 s/p laparoscopic cholecystectomy, loa by Dr. Dwain Sarna on 07/02/23 for acute cholecystitis  - Diet as tolerated - can dc home when medically ready, will sign off, can see [place] in office    Ashley Freeman 07/07/2023

## 2023-07-07 NOTE — Progress Notes (Signed)
  Progress Note   Patient: Ashley Freeman:[id number] DOB: 12/23/1990 DOA: 06/30/2023     7 DOS: the patient was seen and examined on 07/07/2023   Brief hospital course: 33 year old with type 1 diabetes on insulin pump, chronic systolic heart failure dilated cardiomyopathy, ESRD on hemodialysis TTS, hypertension hyperlipidemia and history of C. difficile presented with body ache and abdominal pain for several days. Seen in the emergency room on 2/23 and discharged home. These days she is not using insulin pump. In the emergency department patient was afebrile. WBC 8.6. Today lactic acid 2.8. 3.2. Ultrasound of the gallbladder with cholelithiasis and distended thick-walled gallbladder with positive Murphy sign.   Assessment and Plan: Acute calculus cholecystitis:  -Gen Surg following. Pt is now s/p lap chole 3/4.  -Continue with analgesia and anti-emetic as needed -continue on renal/carb mod diet -Gen Surg has since signed off   Type 1 diabetes with uncontrolled blood sugars:  -Continues with labile glucose, either hyer or hypoglycemic -Recent A1c 10.6.   -Now on semglee 8 units and have resumed meal coverage to 2 units per diabetic coordinator recs -cont SSI to q4h   ESRD on hemodialysis/hyperkalemia: -Nephrology following for TTS HD -Potassium normalized   Chronic systolic heart failure, hypertension: -Patient with dilated cardiomyopathy.   -Patient previously with reduced ejection fraction, improved in the past.  Repeat echocardiogram was done 3/3 that showed ejection fraction of 25 to 30%.   -Cardiology had been following. Pt to f/u with Cardiology at [facility] on d/c. Cardiology has since signed off  Vulvular swelling and pain -Recently noted to have area of non-erythematous, tender, non-draining swelling over R labia, now worse overnight -Have started empiric clinda -Remains afebrile. No leukocytosis -Have discussed with GYN who will see pt in consultation   Subjective:  Continues with exquisite vulvar pain  Physical Exam: Vitals:   07/06/23 2005 07/07/23 0431 07/07/23 0838 07/07/23 1455  BP: 138/87 131/77 122/79 (!) 140/89  Pulse: 87 79 85 80  Resp: 16 16 16 16   Temp: 98.2 F (36.8 C) 98.2 F (36.8 C)  98.4 F (36.9 C)  TempSrc: Oral Oral  Oral  SpO2: 96% 95% 98% 98%  Weight:      Height:       General exam: Awake, laying in bed, in nad Respiratory system: Normal respiratory effort, no wheezing Cardiovascular system: regular rate, s1, s2 Gastrointestinal system: Soft, nondistended, positive BS Central nervous system: CN2-12 grossly intact, strength intact Extremities: Perfused, no clubbing Skin: Normal skin turgor, no rashes Psychiatry: Mood normal // no visual hallucinations   Data Reviewed:  Labs reviewed: Na 134, K 4.0, Cr 5.06, WBC 9.1  Family Communication: Pt in room, family at bedside  Disposition: Status is: Inpatient Remains inpatient appropriate because: severity of illness  Planned Discharge Destination: Home    Author: Rickey Barbara, MD 07/07/2023 4:00 PM  For on call review [email or web address].

## 2023-07-07 NOTE — Progress Notes (Signed)
 Dr Scheryl Darter examined Pt's perineum area(labia) with the presence of this RN.

## 2023-07-07 NOTE — Plan of Care (Signed)
  Problem: Pain Managment: Goal: General experience of comfort will improve and/or be controlled Outcome: Progressing   Problem: Safety: Goal: Ability to remain free from injury will improve Outcome: Progressing   Problem: Education: Goal: Ability to describe self-care measures that may prevent or decrease complications (Diabetes Survival Skills Education) will improve Outcome: Progressing   Problem: Coping: Goal: Ability to adjust to condition or change in health will improve Outcome: Progressing   Problem: Health Behavior/Discharge Planning: Goal: Ability to manage health-related needs will improve Outcome: Progressing

## 2023-07-08 ENCOUNTER — Other Ambulatory Visit (HOSPITAL_COMMUNITY): Payer: Self-pay

## 2023-07-08 DIAGNOSIS — Z992 Dependence on renal dialysis: Secondary | ICD-10-CM | POA: Diagnosis not present

## 2023-07-08 DIAGNOSIS — K819 Cholecystitis, unspecified: Secondary | ICD-10-CM | POA: Diagnosis not present

## 2023-07-08 DIAGNOSIS — E860 Dehydration: Secondary | ICD-10-CM | POA: Diagnosis not present

## 2023-07-08 DIAGNOSIS — N186 End stage renal disease: Secondary | ICD-10-CM | POA: Diagnosis not present

## 2023-07-08 LAB — CBC
HCT: 40.3 % (ref 36.0–46.0)
Hemoglobin: 12.2 g/dL (ref 12.0–15.0)
MCH: 25.8 pg — ABNORMAL LOW (ref 26.0–34.0)
MCHC: 30.3 g/dL (ref 30.0–36.0)
MCV: 85.2 fL (ref 80.0–100.0)
Platelets: 213 10*3/uL (ref 150–400)
RBC: 4.73 MIL/uL (ref 3.87–5.11)
RDW: 16.5 % — ABNORMAL HIGH (ref 11.5–15.5)
WBC: 9.9 10*3/uL (ref 4.0–10.5)
nRBC: 0 % (ref 0.0–0.2)

## 2023-07-08 LAB — COMPREHENSIVE METABOLIC PANEL
ALT: 49 U/L — ABNORMAL HIGH (ref 0–44)
AST: 104 U/L — ABNORMAL HIGH (ref 15–41)
Albumin: 3.1 g/dL — ABNORMAL LOW (ref 3.5–5.0)
Alkaline Phosphatase: 276 U/L — ABNORMAL HIGH (ref 38–126)
Anion gap: 14 (ref 5–15)
BUN: 40 mg/dL — ABNORMAL HIGH (ref 6–20)
CO2: 26 mmol/L (ref 22–32)
Calcium: 9.1 mg/dL (ref 8.9–10.3)
Chloride: 98 mmol/L (ref 98–111)
Creatinine, Ser: 6.02 mg/dL — ABNORMAL HIGH (ref 0.44–1.00)
GFR, Estimated: 9 mL/min — ABNORMAL LOW (ref >60)
Glucose, Bld: 182 mg/dL — ABNORMAL HIGH (ref 70–99)
Potassium: 5.4 mmol/L — ABNORMAL HIGH (ref 3.5–5.1)
Sodium: 138 mmol/L (ref 135–145)
Total Bilirubin: 0.8 mg/dL (ref 0.0–1.2)
Total Protein: 6.5 g/dL (ref 6.5–8.1)

## 2023-07-08 LAB — GLUCOSE, CAPILLARY
Glucose-Capillary: 143 mg/dL — ABNORMAL HIGH (ref 70–99)
Glucose-Capillary: 153 mg/dL — ABNORMAL HIGH (ref 70–99)
Glucose-Capillary: 160 mg/dL — ABNORMAL HIGH (ref 70–99)
Glucose-Capillary: 217 mg/dL — ABNORMAL HIGH (ref 70–99)

## 2023-07-08 MED ORDER — DOXYCYCLINE HYCLATE 100 MG PO TABS
100.0000 mg | ORAL_TABLET | Freq: Two times a day (BID) | ORAL | 0 refills | Status: AC
  Filled 2023-07-08: qty 6, 3d supply, fill #0

## 2023-07-08 MED ORDER — CALCITRIOL 0.25 MCG PO CAPS
1.2500 ug | ORAL_CAPSULE | ORAL | 0 refills | Status: DC
  Filled 2023-07-08: qty 30, 14d supply, fill #0

## 2023-07-08 NOTE — Progress Notes (Signed)
 D/C order noted. Contacted [facility] to  be advised of pt's d/c today and that pt should resume care tomorrow. Today's renal note faxed to clinic for continuation of care. Will fax d/c summary once available.   Olivia Canter Renal Navigator [phone]  Late Note entry- July 09, 2023 D/C summary faxed to clinic this morning for continuation of care.

## 2023-07-08 NOTE — Discharge Summary (Signed)
 Physician Discharge Summary   Patient: Ashley Freeman MRN: [id number] DOB: Apr 07, 1991  Admit date:     06/30/2023  Discharge date: 07/08/23  Discharge Physician: Rickey Barbara   PCP: [facility]   Recommendations at discharge:    Follow up with PCP In 1-2 weeks Follow up with General Surgery as scheduled  Discharge Diagnoses: Principal Problem:   Cholecystitis Active Problems:   Hyperglycemic crisis due to Type 1 diabetes mellitus (HCC)   Chronic combined systolic and diastolic heart failure (HCC)   Lactic acidosis   Hyperkalemia   ESRD on hemodialysis (HCC)   Prolonged QT interval   Essential hypertension   Bipolar disorder (HCC)   Preop cardiovascular exam  Resolved Problems:   * No resolved hospital problems. *  Hospital Course: 33 year old with type 1 diabetes on insulin pump, chronic systolic heart failure dilated cardiomyopathy, ESRD on hemodialysis TTS, hypertension hyperlipidemia and history of C. difficile presented with body ache and abdominal pain for several days. Seen in the emergency room on 2/23 and discharged home. These days she is not using insulin pump. In the emergency department patient was afebrile. WBC 8.6. Today lactic acid 2.8. 3.2. Ultrasound of the gallbladder with cholelithiasis and distended thick-walled gallbladder with positive Murphy sign.   Assessment and Plan: Acute calculus cholecystitis:  -Gen Surg following. Pt is now s/p lap chole 3/4.  -Continue with analgesia and anti-emetic as needed -Was continued on renal/carb mod diet -Gen Surg has since signed off   Type 1 diabetes with uncontrolled blood sugars:  -during admit, had labile glucose, either hyer or hypoglycemic -Recent A1c 10.6.   -Now on semglee 8 units and have resumed meal coverage to 2 units per diabetic coordinator recs -Glycemic trends were more stable by day of d/c   ESRD on hemodialysis/hyperkalemia: -Nephrology following for TTS HD   Chronic systolic  heart failure, hypertension: -Patient with dilated cardiomyopathy.   -Patient previously with reduced ejection fraction, improved in the past.  Repeat echocardiogram was done 3/3 that showed ejection fraction of 25 to 30%.   -Cardiology had been following. Pt to f/u with Cardiology at [facility] on d/c. Cardiology has since signed off   Vulvular swelling and pain -Recently noted to have area of non-erythematous, tender, non-draining swelling over R labia, now worse overnight -Had started empiric clinda -Remains afebrile. No leukocytosis -Pt seen by Gyn. Recs to continue antibiotics to cover skin infection and to f/u with her Gyn in [facility] -Given hx of recurrent Cdiff, have prescribed doxycycline instead to complete course of tx. Discussed with pharmacist.     Consultants: General Surgery, Nephrology, Gyn Procedures performed: s/p lap chole  Disposition: Home Diet recommendation:  Renal diet  DISCHARGE MEDICATION: Allergies as of 07/08/2023       Reactions   Cephalexin Anaphylaxis   Has gotten ceftriaxone in the past    Morphine Itching   Penicillins Hives, Rash   Benadryl [diphenhydramine] Itching   Doxycycline Itching   Methotrexate Derivatives Rash        Medication List     STOP taking these medications    amLODipine 10 MG tablet Commonly known as: NORVASC   Baqsimi Two Pack 3 MG/DOSE Powd Generic drug: Glucagon   bumetanide 2 MG tablet Commonly known as: BUMEX   Dexcom G6 Transmitter Misc   lamoTRIgine 200 MG tablet Commonly known as: LAMICTAL   levothyroxine 100 MCG tablet Commonly known as: SYNTHROID   LORazepam 1 MG tablet Commonly known as: ATIVAN  permethrin 5 % cream Commonly known as: ELIMITE   predniSONE 5 MG tablet Commonly known as: DELTASONE       TAKE these medications    acetaminophen 500 MG tablet Commonly known as: TYLENOL Take 1,000 mg by mouth as needed for mild pain (pain score 1-3) or moderate pain (pain score 4-6).    albuterol 108 (90 Base) MCG/ACT inhaler Commonly known as: VENTOLIN HFA Inhale 2 puffs into the lungs every 4 (four) hours as needed.   calcitRIOL 0.25 MCG capsule Commonly known as: ROCALTROL Take 5 capsules (1.25 mcg total) by mouth every Tuesday, Thursday, and Saturday at 6 PM. Start taking on: July 09, 2023   carvedilol 25 MG tablet Commonly known as: COREG Take 25 mg by mouth 2 (two) times daily with a meal.   Dexcom G7 Sensor Misc Change sensors every 10 days What changed: Another medication with the same name was removed. Continue taking this medication, and follow the directions you see here.   doxycycline 100 MG tablet Commonly known as: VIBRA-TABS Take 1 tablet (100 mg total) by mouth 2 (two) times daily for 3 days. Start taking on: July 09, 2023   FLUoxetine 20 MG capsule Commonly known as: PROZAC Take 1 capsule by mouth daily.   HYDROcodone-acetaminophen 5-325 MG tablet Commonly known as: NORCO/VICODIN Take 1 tablet by mouth every 6 (six) hours as needed for moderate pain (pain score 4-6) or severe pain (pain score 7-10).   hydrOXYzine 25 MG tablet Commonly known as: ATARAX Take 25 mg by mouth in the morning and at bedtime.   Insulin Aspart FlexPen 100 UNIT/ML Commonly known as: NOVOLOG Inject 4 Units into the skin 3 (three) times daily.   Lantus SoloStar 100 UNIT/ML Solostar Pen Generic drug: insulin glargine SMARTSIG:8 Unit(s) SUB-Q Daily   losartan 100 MG tablet Commonly known as: COZAAR Take 1 tablet by mouth daily.   ondansetron 4 MG disintegrating tablet Commonly known as: ZOFRAN-ODT Take 1 tablet (4 mg total) by mouth every 8 (eight) hours as needed for nausea or vomiting.   polyethylene glycol 17 g packet Commonly known as: MIRALAX / GLYCOLAX Take 17 g by mouth daily as needed.   rosuvastatin 40 MG tablet Commonly known as: CRESTOR Take 40 mg by mouth daily.   sevelamer carbonate 800 MG tablet Commonly known as: RENVELA Take 2,400 mg  by mouth 3 (three) times daily with meals.   SUMAtriptan 50 MG tablet Commonly known as: IMITREX Take 50 mg by mouth every 2 (two) hours as needed for migraine or headache.        Follow-up Information     Maczis, Hedda Slade, PA-C Follow up in 3 week(s).   Specialty: General Surgery Why: call to make a follow up appt Contact information: [address] [facility] [place] [address] [phone]         [facility] Follow up in 2 week(s).   Why: Hospital follow up Contact information: [address] [place] [address] [phone]         Follow up with your Gyn provider in [facility]. Schedule an appointment as soon as possible for a visit.   Why: Hospital follow up               Discharge Exam: Filed Weights   07/06/23 0831 07/06/23 0839 07/06/23 1321  Weight: 62.3 kg 62.3 kg 59.3 kg   General exam: Awake, laying in bed, in nad Respiratory system: Normal respiratory  effort, no wheezing Cardiovascular system: regular rate, s1, s2 Gastrointestinal system: Soft, nondistended, positive BS Central nervous system: CN2-12 grossly intact, strength intact Extremities: Perfused, no clubbing Skin: Normal skin turgor, no notable skin lesions seen Psychiatry: Mood normal // no visual hallucinations   Condition at discharge: fair  The results of significant diagnostics from this hospitalization (including imaging, microbiology, ancillary and laboratory) are listed below for reference.   Imaging Studies: ECHOCARDIOGRAM COMPLETE Result Date: 07/01/2023    ECHOCARDIOGRAM REPORT   Patient Name:   PAULETT KAUFHOLD [facility] Date of Exam: 07/01/2023 Medical Rec #:  [id number]                    Height:       63.0 in Accession #:    [id number]                   Weight:       133.0 lb Date of Birth:  [birth year]                     BSA:          1.626 m Patient Age:    33 years                     BP:           126/86 mmHg Patient Gender:  F                            HR:           66 bpm. Exam Location:  Inpatient Procedure: 2D Echo (Both Spectral and Color Flow Doppler were utilized during            procedure). Indications:    acute systolic chf  History:        Patient has prior history of Echocardiogram examinations, most                 recent 06/10/2020. End stage renal disease; Risk                 Factors:Hypertension.  Sonographer:    Delcie Roch RDCS Referring Phys: [id number] Cyndi Bender IMPRESSIONS  1. Left ventricular ejection fraction, by estimation, is 25 to 30%. The left ventricle has severely decreased function. The left ventricle demonstrates global hypokinesis. Left ventricular diastolic parameters are consistent with Grade II diastolic dysfunction (pseudonormalization).  2. Mildly D-shaped septum suggestive of RV pressure/volume overload. Right ventricular systolic function is mildly reduced. The right ventricular size is normal. There is normal pulmonary artery systolic pressure. The estimated right ventricular systolic pressure is 31.0 mmHg.  3. Left atrial size was mildly dilated.  4. Right atrial size was mildly dilated.  5. The mitral valve is normal in structure. Trivial mitral valve regurgitation. No evidence of mitral stenosis.  6. The tricuspid valve did not completely coapt. The tricuspid valve is abnormal. Tricuspid valve regurgitation is severe.  7. The aortic valve is tricuspid. Aortic valve regurgitation is not visualized. No aortic stenosis is present.  8. The inferior vena cava is normal in size with <50% respiratory variability, suggesting right atrial pressure of 8 mmHg.  9. A small pericardial effusion is present. FINDINGS  Left Ventricle: Left ventricular ejection fraction, by estimation, is 25 to 30%. The left ventricle has severely decreased function. The left ventricle demonstrates global hypokinesis. The left ventricular internal cavity size was normal  in size. There is no left ventricular hypertrophy. Left  ventricular diastolic parameters are consistent with Grade II diastolic dysfunction (pseudonormalization). Right Ventricle: Mildly D-shaped septum suggestive of RV pressure/volume overload. The right ventricular size is normal. No increase in right ventricular wall thickness. Right ventricular systolic function is mildly reduced. There is normal pulmonary artery systolic pressure. The tricuspid regurgitant velocity is 2.40 m/s, and with an assumed right atrial pressure of 8 mmHg, the estimated right ventricular systolic pressure is 31.0 mmHg. Left Atrium: Left atrial size was mildly dilated. Right Atrium: Right atrial size was mildly dilated. Pericardium: A small pericardial effusion is present. Mitral Valve: The mitral valve is normal in structure. Trivial mitral valve regurgitation. No evidence of mitral valve stenosis. Tricuspid Valve: The tricuspid valve did not completely coapt. The tricuspid valve is abnormal. Tricuspid valve regurgitation is severe. Aortic Valve: The aortic valve is tricuspid. Aortic valve regurgitation is not visualized. No aortic stenosis is present. Pulmonic Valve: The pulmonic valve was normal in structure. Pulmonic valve regurgitation is mild. Aorta: The aortic root is normal in size and structure. Venous: The inferior vena cava is normal in size with less than 50% respiratory variability, suggesting right atrial pressure of 8 mmHg. IAS/Shunts: No atrial level shunt detected by color flow Doppler.  LEFT VENTRICLE PLAX 2D LVIDd:         4.90 cm      Diastology LVIDs:         4.20 cm      LV e' medial:    4.90 cm/s LV PW:         1.00 cm      LV E/e' medial:  15.9 LV IVS:        0.90 cm      LV e' lateral:   6.53 cm/s LVOT diam:     1.60 cm      LV E/e' lateral: 11.9 LV SV:         27 LV SV Index:   17 LVOT Area:     2.01 cm  LV Volumes (MOD) LV vol d, MOD A2C: 104.0 ml LV vol d, MOD A4C: 96.4 ml LV vol s, MOD A2C: 79.6 ml LV vol s, MOD A4C: 66.2 ml LV SV MOD A2C:     24.4 ml LV SV MOD  A4C:     96.4 ml LV SV MOD BP:      25.9 ml RIGHT VENTRICLE             IVC RV Basal diam:  3.00 cm     IVC diam: 1.60 cm RV S prime:     11.70 cm/s TAPSE (M-mode): 1.7 cm LEFT ATRIUM           Index        RIGHT ATRIUM           Index LA diam:      3.50 cm 2.15 cm/m   RA Area:     19.30 cm LA Vol (A4C): 63.2 ml 38.87 ml/m  RA Volume:   58.10 ml  35.74 ml/m  AORTIC VALVE LVOT Vmax:   71.00 cm/s LVOT Vmean:  44.700 cm/s LVOT VTI:    0.134 m  AORTA Ao Root diam: 2.40 cm Ao Asc diam:  2.70 cm MITRAL VALVE               TRICUSPID VALVE MV Area (PHT): 4.68 cm    TR Peak grad:   23.0 mmHg MV Decel Time: 162  msec    TR Vmax:        240.00 cm/s MV E velocity: 77.80 cm/s MV A velocity: 67.70 cm/s  SHUNTS MV E/A ratio:  1.15        Systemic VTI:  0.13 m                            Systemic Diam: 1.60 cm Dalton McleanMD Electronically signed by Wilfred Lacy Signature Date/Time: 07/01/2023/4:17:44 PM    Final    US Abdomen Limited RUQ (LIVER/GB) Result Date: 06/30/2023 CLINICAL DATA:  Right upper quadrant pain EXAM: ULTRASOUND ABDOMEN LIMITED RIGHT UPPER QUADRANT COMPARISON:  06/23/2023 FINDINGS: Gallbladder: Small volume material in the dependent gallbladder without shadowing, high-density by prior CT. There is diffuse wall thickening to 6 mm but no focal tenderness or pericholecystic edema. Common bile duct: Diameter: 5 mm Liver: No focal lesion identified. Within normal limits in parenchymal echogenicity. Portal vein is patent on color Doppler imaging with normal direction of blood flow towards the liver. Other: Small volume perihepatic ascites also seen on prior study. IMPRESSION: Cholelithiasis (better seen on prior CT) and distended, thick walled gallbladder but no sonographic Murphy sign typical of acute cholecystitis. Electronically Signed   By: Tiburcio Pea M.D.   On: 06/30/2023 12:14   DG Chest Portable 1 View Result Date: 06/30/2023 CLINICAL DATA:  Shortness of breath. EXAM: PORTABLE CHEST 1 VIEW  COMPARISON:  06/23/2023 FINDINGS: Heart size is normal. Lung volumes are low. No pleural fluid, interstitial edema or airspace disease. Visualized osseous structures appear intact. IMPRESSION: Low lung volumes. No acute findings. Electronically Signed   By: Signa Kell M.D.   On: 06/30/2023 09:02   CT ABDOMEN PELVIS WO CONTRAST Result Date: 06/23/2023 CLINICAL DATA:  Nausea, vomiting and weakness x2 days. EXAM: CT ABDOMEN AND PELVIS WITHOUT CONTRAST TECHNIQUE: Multidetector CT imaging of the abdomen and pelvis was performed following the standard protocol without IV contrast. RADIATION DOSE REDUCTION: This exam was performed according to the departmental dose-optimization program which includes automated exposure control, adjustment of the mA and/or kV according to patient size and/or use of iterative reconstruction technique. COMPARISON:  December 13, 2022 FINDINGS: Lower chest: There is stable mild to moderate severity cardiomegaly. Mild atelectatic changes are seen within the left lung base. Hepatobiliary: No focal liver abnormality is seen. A thin layer of tiny gallstones is seen within the dependent portion of a moderately distended gallbladder. There is no evidence of gallbladder wall thickening, biliary dilatation or peripancreatic inflammatory fat stranding. Pancreas: Unremarkable. No pancreatic ductal dilatation or surrounding inflammatory changes. Spleen: Normal in size without focal abnormality. Adrenals/Urinary Tract: Adrenal glands are unremarkable. Kidneys are normal, without renal calculi, focal lesion, or hydronephrosis. The urinary bladder is poorly distended and subsequently limited in evaluation. Mild diffuse urinary bladder wall thickening is seen. Stomach/Bowel: Stomach is within normal limits. Appendix appears normal. No evidence of bowel dilatation. Moderately thickened and inflamed small bowel is seen involving predominantly the mid to distal jejunum and ileum. Vascular/Lymphatic: Stable  marked severity calcification of the mesenteric arteries is seen. No enlarged abdominal or pelvic lymph nodes. Reproductive: Uterus and bilateral adnexa are unremarkable. Other: No abdominal wall hernia or abnormality. Mild to moderate severity abdominopelvic ascites is noted. This is increased in severity when compared to the prior exam. Musculoskeletal: No acute or significant osseous findings. IMPRESSION: 1. Moderate severity infectious versus inflammatory enteritis involving predominantly the mid to distal jejunum and ileum. 2. Mild to  moderate severity abdominopelvic ascites, increased in severity when compared to the prior exam. 3. Cholelithiasis. Electronically Signed   By: Aram Candela M.D.   On: 06/23/2023 17:29   DG Chest 2 View Result Date: 06/23/2023 CLINICAL DATA:  Cough. EXAM: CHEST - 2 VIEW COMPARISON:  May 20, 2023. FINDINGS: Stable cardiomegaly. Both lungs are clear. The visualized skeletal structures are unremarkable. IMPRESSION: No active cardiopulmonary disease. Electronically Signed   By: Lupita Raider M.D.   On: 06/23/2023 14:54   PERIPHERAL VASCULAR CATHETERIZATION Result Date: 06/17/2023 See surgical note for result.   Microbiology: Results for orders placed or performed during the hospital encounter of 06/30/23  Resp panel by RT-PCR (RSV, Flu A&B, Covid) Anterior Nasal Swab     Status: None   Collection Time: 06/30/23  7:56 AM   Specimen: Anterior Nasal Swab  Result Value Ref Range Status   SARS Coronavirus 2 by RT PCR NEGATIVE NEGATIVE Final   Influenza A by PCR NEGATIVE NEGATIVE Final   Influenza B by PCR NEGATIVE NEGATIVE Final    Comment: (NOTE) The Xpert Xpress SARS-CoV-2/FLU/RSV plus assay is intended as an aid in the diagnosis of influenza from Nasopharyngeal swab specimens and should not be used as a sole basis for treatment. Nasal washings and aspirates are unacceptable for Xpert Xpress SARS-CoV-2/FLU/RSV testing.  Fact Sheet for  Patients: BloggerCourse.com  Fact Sheet for Healthcare Providers: SeriousBroker.it  This test is not yet approved or cleared by the [place] FDA and has been authorized for detection and/or diagnosis of SARS-CoV-2 by FDA under an Emergency Use Authorization (EUA). This EUA will remain in effect (meaning this test can be used) for the duration of the COVID-19 declaration under Section 564(b)(1) of the Act, 21 U.S.C. section 360bbb-3(b)(1), unless the authorization is terminated or revoked.     Resp Syncytial Virus by PCR NEGATIVE NEGATIVE Final    Comment: (NOTE) Fact Sheet for Patients: BloggerCourse.com  Fact Sheet for Healthcare Providers: SeriousBroker.it  This test is not yet approved or cleared by the [place] FDA and has been authorized for detection and/or diagnosis of SARS-CoV-2 by FDA under an Emergency Use Authorization (EUA). This EUA will remain in effect (meaning this test can be used) for the duration of the COVID-19 declaration under Section 564(b)(1) of the Act, 21 U.S.C. section 360bbb-3(b)(1), unless the authorization is terminated or revoked.  Performed at [facility], [address] [place], [place] [address]   MRSA Next Gen by PCR, Nasal     Status: None   Collection Time: 06/30/23  4:55 PM  Result Value Ref Range Status   MRSA by PCR Next Gen NOT DETECTED NOT DETECTED Final    Comment: (NOTE) The GeneXpert MRSA Assay (FDA approved for NASAL specimens only), is one component of a comprehensive MRSA colonization surveillance program. It is not intended to diagnose MRSA infection nor to guide or monitor treatment for MRSA infections. Test performance is not FDA approved in patients less than 63 years old. Performed at [facility], [address], [place], [place] [address]     Labs: CBC: Recent Labs  Lab 07/03/23 [id number]  07/04/23 0701 07/06/23 0840 07/07/23 0949 07/08/23 0752  WBC 10.5 8.8 8.4 9.1 9.9  HGB 12.3 11.3* 10.6* 11.2* 12.2  HCT 40.3 36.9 35.1* 36.2 40.3  MCV 83.8 84.6 85.2 82.5 85.2  PLT 187 180 178 176 213   Basic Metabolic Panel: Recent Labs  Lab 07/04/23 0701 07/05/23 0848 07/06/23 0840 07/07/23  1610 07/08/23 0752  NA 133* 136 133* 134* 138  K 4.3 4.4 4.1 4.0 5.4*  CL 98 96* 98 98 98  CO2 17* 23 20* 26 26  GLUCOSE 385* 184* 416* 141* 182*  BUN 40* 30* 45* 27* 40*  CREATININE 7.92* 6.29* 7.36* 5.06* 6.02*  CALCIUM 7.6* 8.4* 8.2* 8.3* 9.1  PHOS  --   --  4.5  --   --    Liver Function Tests: Recent Labs  Lab 07/03/23 0634 07/04/23 0701 07/05/23 0848 07/06/23 0840 07/07/23 0949 07/08/23 0752  AST 32 19 35  --  68* 104*  ALT 26 20 23   --  32 49*  ALKPHOS 161* 138* 165*  --  204* 276*  BILITOT 0.8 1.6* 0.9  --  0.8 0.8  PROT 5.1* 5.2* 6.1*  --  5.6* 6.5  ALBUMIN 2.8* 2.8* 3.1* 2.7* 2.7* 3.1*   CBG: Recent Labs  Lab 07/07/23 2123 07/08/23 0029 07/08/23 0450 07/08/23 0819 07/08/23 1244  GLUCAP 192* 160* 153* 217* 143*    Discharge time spent: less than 30 minutes.  Signed: Rickey Barbara, MD [facility] 07/08/2023

## 2023-07-08 NOTE — Progress Notes (Signed)
 Mobility Specialist Progress Note:   07/08/23 1226  Mobility  Activity Dangled on edge of bed  Level of Assistance Independent  Assistive Device None  Mobility Referral Yes  Mobility visit 1 Mobility  Mobility Specialist Start Time (ACUTE ONLY) 1118  Mobility Specialist Stop Time (ACUTE ONLY) 1125  Mobility Specialist Time Calculation (min) (ACUTE ONLY) 7 min   Pt received in bed, requesting towels/gown for bath. Declining ambulation, but requesting to come back at a later time today. Will follow through as time permits.   Ashley Freeman  Mobility Specialist Please contact via [employer] office at [phone]

## 2023-07-08 NOTE — Plan of Care (Signed)
  Problem: Pain Managment: Goal: General experience of comfort will improve and/or be controlled Outcome: Progressing   Problem: Safety: Goal: Ability to remain free from injury will improve Outcome: Progressing   Problem: Education: Goal: Ability to describe self-care measures that may prevent or decrease complications (Diabetes Survival Skills Education) will improve Outcome: Progressing   Problem: Coping: Goal: Ability to adjust to condition or change in health will improve Outcome: Progressing   Problem: Health Behavior/Discharge Planning: Goal: Ability to identify and utilize available resources and services will improve Outcome: Progressing

## 2023-07-08 NOTE — Progress Notes (Signed)
 Ashley Freeman to be D/C'd  per MD order.  Discussed with the patient and all questions fully answered.  VSS, Skin clean, dry and intact without evidence of skin break down, no evidence of skin tears noted.  IV catheter discontinued intact. Site without signs and symptoms of complications. Dressing and pressure applied.  An After Visit Summary was printed and given to the patient. Patient received prescription from [facility] pharmacy.  D/c education completed with patient/family including follow up instructions, medication list, d/c activities limitations if indicated, with other d/c instructions as indicated by MD - patient able to verbalize understanding, all questions fully answered.   Patient instructed to return to ED, call 911, or call MD for any changes in condition.   Patient to be escorted via WC, and D/C home via private auto.

## 2023-07-08 NOTE — Progress Notes (Signed)
 [facility] Progress Note   Subjective:   Seen in room - feeling better. Labial abscess improved with antibiotics. Likely to discharge today.   Objective Vitals:   07/07/23 1455 07/07/23 2042 07/08/23 0449 07/08/23 0821  BP: (!) 140/89 127/81 125/85 128/80  Pulse: 80 85 78 84  Resp: 16 16 16 16   Temp: 98.4 F (36.9 C) 98 F (36.7 C) 98.2 F (36.8 C) 98.4 F (36.9 C)  TempSrc: Oral   Oral  SpO2: 98% 99% 99% 100%  Weight:      Height:       Physical Exam General: Well appearing woman, NAD. Room air Heart: RRR; no murmur Lungs: CTAB Extremities: no LE edema Dialysis Access: R AVG + t/b  Additional Objective Labs: Basic Metabolic Panel: Recent Labs  Lab 07/06/23 0840 07/07/23 0949 07/08/23 0752  NA 133* 134* 138  K 4.1 4.0 5.4*  CL 98 98 98  CO2 20* 26 26  GLUCOSE 416* 141* 182*  BUN 45* 27* 40*  CREATININE 7.36* 5.06* 6.02*  CALCIUM 8.2* 8.3* 9.1  PHOS 4.5  --   --    Liver Function Tests: Recent Labs  Lab 07/05/23 0848 07/06/23 0840 07/07/23 0949 07/08/23 0752  AST 35  --  68* 104*  ALT 23  --  32 49*  ALKPHOS 165*  --  204* 276*  BILITOT 0.9  --  0.8 0.8  PROT 6.1*  --  5.6* 6.5  ALBUMIN 3.1* 2.7* 2.7* 3.1*   CBC: Recent Labs  Lab 07/03/23 0634 07/04/23 0701 07/06/23 0840 07/07/23 0949 07/08/23 0752  WBC 10.5 8.8 8.4 9.1 9.9  HGB 12.3 11.3* 10.6* 11.2* 12.2  HCT 40.3 36.9 35.1* 36.2 40.3  MCV 83.8 84.6 85.2 82.5 85.2  PLT 187 180 178 176 213   Medications:  anticoagulant sodium citrate      (feeding supplement) PROSource Plus  30 mL Oral BID BM   calcitRIOL  1.25 mcg Oral Q T,Th,Sat-1800   carvedilol  25 mg Oral BID WC   Chlorhexidine Gluconate Cloth  6 each Topical Q0600   cinacalcet  60 mg Oral Q T,Th,Sa-HD   clindamycin  300 mg Oral Q6H   FLUoxetine  20 mg Oral Daily   heparin  5,000 Units Subcutaneous Q8H   insulin aspart  0-6 Units Subcutaneous Q4H   insulin aspart  2 Units Subcutaneous TID WC   insulin  glargine  8 Units Subcutaneous Daily   losartan  100 mg Oral Daily   nystatin cream   Topical BID   sevelamer carbonate  2,400 mg Oral TID WC   sodium chloride flush  3 mL Intravenous Q12H    Dialysis Orders [facility].  TTS 3h 350/500 2K/2.5Ca  EDW 57.5 kg AVG 16g  Last OP HD 06/27/23 post HD wt 59.7kg  -Tums EX 750 q HD -Cinacalcet 60 mg three times per week -Calcitriol 1.25 mcg three times per week -Heparin 1600 units bolus then 600 units/hr    Assessment/Plan: 1. Acute cholecystitis - s/p lap chole with LOA. Per surgery  2. ESRD -  HD TTS. Continue on schedule - next HD Tues 3/11 at home unit.  3. Hyperkalemia - On admit. Resolved with HD.  4. Hyperglycemia/T1DM - per primary  5. HTN/volume-  BP ok. Not to EDW, UF as able.  6. Anemia - Hgb 11.2 - monitor without ESA for now. 7. MBD - Corr Ca ok. Continue binder, VDRA, sensipar. 8. Nutrition - Alb low, continue protein  supps. 9. Vulvar/labial infection: On clinda, per primary.   Tomasa Blase PA-C [facility] 07/08/2023,11:43 AM

## 2023-07-08 NOTE — Plan of Care (Signed)
  Problem: Education: Goal: Knowledge of General Education information will improve Description: Including pain rating scale, medication(s)/side effects and non-pharmacologic comfort measures Outcome: Adequate for Discharge   Problem: Health Behavior/Discharge Planning: Goal: Ability to manage health-related needs will improve Outcome: Adequate for Discharge   Problem: Clinical Measurements: Goal: Ability to maintain clinical measurements within normal limits will improve Outcome: Adequate for Discharge Goal: Will remain free from infection Outcome: Adequate for Discharge Goal: Diagnostic test results will improve Outcome: Adequate for Discharge Goal: Respiratory complications will improve Outcome: Adequate for Discharge Goal: Cardiovascular complication will be avoided Outcome: Adequate for Discharge   Problem: Activity: Goal: Risk for activity intolerance will decrease Outcome: Adequate for Discharge   Problem: Nutrition: Goal: Adequate nutrition will be maintained Outcome: Adequate for Discharge   Problem: Coping: Goal: Level of anxiety will decrease Outcome: Adequate for Discharge   Problem: Elimination: Goal: Will not experience complications related to bowel motility Outcome: Adequate for Discharge Goal: Will not experience complications related to urinary retention Outcome: Adequate for Discharge   Problem: Pain Managment: Goal: General experience of comfort will improve and/or be controlled Outcome: Adequate for Discharge   Problem: Safety: Goal: Ability to remain free from injury will improve Outcome: Adequate for Discharge   Problem: Skin Integrity: Goal: Risk for impaired skin integrity will decrease Outcome: Adequate for Discharge   Problem: Education: Goal: Ability to describe self-care measures that may prevent or decrease complications (Diabetes Survival Skills Education) will improve Outcome: Adequate for Discharge Goal: Individualized Educational  Video(s) Outcome: Adequate for Discharge   Problem: Coping: Goal: Ability to adjust to condition or change in health will improve Outcome: Adequate for Discharge   Problem: Fluid Volume: Goal: Ability to maintain a balanced intake and output will improve Outcome: Adequate for Discharge   Problem: Health Behavior/Discharge Planning: Goal: Ability to identify and utilize available resources and services will improve Outcome: Adequate for Discharge Goal: Ability to manage health-related needs will improve Outcome: Adequate for Discharge   Problem: Metabolic: Goal: Ability to maintain appropriate glucose levels will improve Outcome: Adequate for Discharge   Problem: Nutritional: Goal: Maintenance of adequate nutrition will improve Outcome: Adequate for Discharge Goal: Progress toward achieving an optimal weight will improve Outcome: Adequate for Discharge   Problem: Skin Integrity: Goal: Risk for impaired skin integrity will decrease Outcome: Adequate for Discharge   Problem: Tissue Perfusion: Goal: Adequacy of tissue perfusion will improve Outcome: Adequate for Discharge   Problem: Education: Goal: Ability to describe self-care measures that may prevent or decrease complications (Diabetes Survival Skills Education) will improve Outcome: Adequate for Discharge Goal: Individualized Educational Video(s) Outcome: Adequate for Discharge   Problem: Fluid Volume: Goal: Ability to maintain a balanced intake and output will improve Outcome: Adequate for Discharge   Problem: Pain Managment: Goal: General experience of comfort will improve and/or be controlled Outcome: Adequate for Discharge   Problem: Clinical Measurements: Goal: Ability to avoid or minimize complications of infection will improve Outcome: Adequate for Discharge   Problem: Skin Integrity: Goal: Skin integrity will improve Outcome: Adequate for Discharge

## 2023-07-11 ENCOUNTER — Encounter (HOSPITAL_COMMUNITY): Payer: Self-pay

## 2023-07-11 ENCOUNTER — Other Ambulatory Visit: Payer: Self-pay

## 2023-07-11 ENCOUNTER — Emergency Department (HOSPITAL_COMMUNITY)
Admission: EM | Admit: 2023-07-11 | Discharge: 2023-07-12 | Disposition: A | Discharge status: Home / Self Care | Attending: Emergency Medicine | Admitting: Emergency Medicine

## 2023-07-11 ENCOUNTER — Emergency Department (HOSPITAL_COMMUNITY)

## 2023-07-11 DIAGNOSIS — E1022 Type 1 diabetes mellitus with diabetic chronic kidney disease: Secondary | ICD-10-CM | POA: Insufficient documentation

## 2023-07-11 DIAGNOSIS — I120 Hypertensive chronic kidney disease with stage 5 chronic kidney disease or end stage renal disease: Secondary | ICD-10-CM | POA: Diagnosis not present

## 2023-07-11 DIAGNOSIS — R109 Unspecified abdominal pain: Secondary | ICD-10-CM | POA: Diagnosis not present

## 2023-07-11 DIAGNOSIS — G8918 Other acute postprocedural pain: Secondary | ICD-10-CM | POA: Diagnosis present

## 2023-07-11 DIAGNOSIS — Z794 Long term (current) use of insulin: Secondary | ICD-10-CM | POA: Insufficient documentation

## 2023-07-11 DIAGNOSIS — N186 End stage renal disease: Secondary | ICD-10-CM | POA: Diagnosis not present

## 2023-07-11 DIAGNOSIS — Z992 Dependence on renal dialysis: Secondary | ICD-10-CM | POA: Diagnosis not present

## 2023-07-11 DIAGNOSIS — R059 Cough, unspecified: Secondary | ICD-10-CM | POA: Insufficient documentation

## 2023-07-11 LAB — CBC WITH DIFFERENTIAL/PLATELET
Abs Immature Granulocytes: 0.07 10*3/uL (ref 0.00–0.07)
Basophils Absolute: 0.1 10*3/uL (ref 0.0–0.1)
Basophils Relative: 1 %
Eosinophils Absolute: 0.2 10*3/uL (ref 0.0–0.5)
Eosinophils Relative: 3 %
HCT: 39.9 % (ref 36.0–46.0)
Hemoglobin: 12.5 g/dL (ref 12.0–15.0)
Immature Granulocytes: 1 %
Lymphocytes Relative: 35 %
Lymphs Abs: 3.3 10*3/uL (ref 0.7–4.0)
MCH: 26.3 pg (ref 26.0–34.0)
MCHC: 31.3 g/dL (ref 30.0–36.0)
MCV: 83.8 fL (ref 80.0–100.0)
Monocytes Absolute: 0.8 10*3/uL (ref 0.1–1.0)
Monocytes Relative: 8 %
Neutro Abs: 5.1 10*3/uL (ref 1.7–7.7)
Neutrophils Relative %: 52 %
Platelets: 338 10*3/uL (ref 150–400)
RBC: 4.76 MIL/uL (ref 3.87–5.11)
RDW: 17.1 % — ABNORMAL HIGH (ref 11.5–15.5)
WBC: 9.6 10*3/uL (ref 4.0–10.5)
nRBC: 0 % (ref 0.0–0.2)

## 2023-07-11 LAB — RESP PANEL BY RT-PCR (RSV, FLU A&B, COVID)  RVPGX2
Influenza A by PCR: NEGATIVE
Influenza B by PCR: NEGATIVE
Resp Syncytial Virus by PCR: NEGATIVE
SARS Coronavirus 2 by RT PCR: NEGATIVE

## 2023-07-11 LAB — LIPASE, BLOOD: Lipase: 22 U/L (ref 11–51)

## 2023-07-11 LAB — COMPREHENSIVE METABOLIC PANEL
ALT: 59 U/L — ABNORMAL HIGH (ref 0–44)
AST: 45 U/L — ABNORMAL HIGH (ref 15–41)
Albumin: 3.5 g/dL (ref 3.5–5.0)
Alkaline Phosphatase: 357 U/L — ABNORMAL HIGH (ref 38–126)
Anion gap: 20 — ABNORMAL HIGH (ref 5–15)
BUN: 74 mg/dL — ABNORMAL HIGH (ref 6–20)
CO2: 16 mmol/L — ABNORMAL LOW (ref 22–32)
Calcium: 8.8 mg/dL — ABNORMAL LOW (ref 8.9–10.3)
Chloride: 96 mmol/L — ABNORMAL LOW (ref 98–111)
Creatinine, Ser: 8.77 mg/dL — ABNORMAL HIGH (ref 0.44–1.00)
GFR, Estimated: 6 mL/min — ABNORMAL LOW (ref >60)
Glucose, Bld: 462 mg/dL — ABNORMAL HIGH (ref 70–99)
Potassium: 6.2 mmol/L — ABNORMAL HIGH (ref 3.5–5.1)
Sodium: 132 mmol/L — ABNORMAL LOW (ref 135–145)
Total Bilirubin: 1.2 mg/dL (ref 0.0–1.2)
Total Protein: 6.7 g/dL (ref 6.5–8.1)

## 2023-07-11 LAB — HCG, SERUM, QUALITATIVE: Preg, Serum: NEGATIVE

## 2023-07-11 LAB — CBG MONITORING, ED: Glucose-Capillary: 300 mg/dL — ABNORMAL HIGH (ref 70–99)

## 2023-07-11 LAB — HEPATITIS B SURFACE ANTIGEN: Hepatitis B Surface Ag: NONREACTIVE

## 2023-07-11 MED ORDER — HYDROXYZINE HCL 25 MG PO TABS
25.0000 mg | ORAL_TABLET | Freq: Once | ORAL | Status: AC
  Administered 2023-07-11: 25 mg via ORAL
  Filled 2023-07-11: qty 1

## 2023-07-11 MED ORDER — SODIUM ZIRCONIUM CYCLOSILICATE 10 G PO PACK
10.0000 g | PACK | Freq: Once | ORAL | Status: AC
  Administered 2023-07-11: 10 g via ORAL
  Filled 2023-07-11: qty 1

## 2023-07-11 MED ORDER — IOHEXOL 350 MG/ML SOLN
65.0000 mL | Freq: Once | INTRAVENOUS | Status: AC | PRN
  Administered 2023-07-11: 65 mL via INTRAVENOUS

## 2023-07-11 MED ORDER — HYDROMORPHONE HCL 1 MG/ML IJ SOLN
1.0000 mg | Freq: Once | INTRAMUSCULAR | Status: AC
  Administered 2023-07-11: 1 mg via INTRAVENOUS
  Filled 2023-07-11: qty 1

## 2023-07-11 MED ORDER — INSULIN ASPART 100 UNIT/ML IJ SOLN
4.0000 [IU] | Freq: Once | INTRAMUSCULAR | Status: AC
  Administered 2023-07-11: 4 [IU] via INTRAVENOUS

## 2023-07-11 MED ORDER — HYDROMORPHONE HCL 1 MG/ML IJ SOLN
0.5000 mg | Freq: Once | INTRAMUSCULAR | Status: AC
  Administered 2023-07-11: 0.5 mg via INTRAVENOUS
  Filled 2023-07-11: qty 1

## 2023-07-11 MED ORDER — CHLORHEXIDINE GLUCONATE CLOTH 2 % EX PADS
6.0000 | MEDICATED_PAD | Freq: Every day | CUTANEOUS | Status: DC
Start: 2023-07-12 — End: 2023-07-12

## 2023-07-11 NOTE — Progress Notes (Addendum)
 Informed of ESRD patient in ER. Just discharged recently, here for choleycystectomy, discharged 3/10. Hasn't had dialysis since Sat. Presents with generalized aches, abd pain. Hyperkalemic (however with severe hyperglycemia), on RA, no active disease on CXR.  Plan: -lokelma 10g now. Plan for HD today/tonight. Dispo per ER. Please let us know if patient is to be admitted for formal consult. Please call with any questions/concerns.  Anthony Sar, MD [facility]

## 2023-07-11 NOTE — ED Notes (Signed)
 Patient is ready for transport to Dialysis. Consent has been electronically signed.

## 2023-07-11 NOTE — ED Provider Notes (Signed)
 [place] EMERGENCY DEPARTMENT AT [facility] Provider Note   CSN: [id number] Arrival date & time: 07/11/23  1114     History  No chief complaint on file.   Ashley Freeman is a 33 y.o. female.  HPI Patient presents she is feeling bad.  States that she got out of the hospital 3 days ago.  Began to feel worse.  States she aches all over.  Has had shortness of breath and cough.  Increasing abdominal pain.  Had cholecystectomy 9 days ago in the hospital. She is a Tuesday Thursday Saturday dialysis patient.  Last dialyzed on Saturday with today being Thursday.  States she feels if she has too much fluid.  States her abdomen is hurting more than it was previously Past Medical History:  Diagnosis Date   Abscess, gluteal, right 08/24/2013   Anemia 02/19/2012   Bartholin's gland abscess 09/19/2013   Bipolar disorder (HCC)    BV (bacterial vaginosis) 11/24/2015   Depression    Diabetes mellitus type I (HCC) 2001   Diagnosed at age 47 ; Type I   Diarrhea 05/30/2016   DKA (diabetic ketoacidoses) 08/19/2013   Also in 2018   ESRD (end stage renal disease) (HCC)    Gonorrhea 08/2011   Treated in 09/2011   HFrEF (heart failure with reduced ejection fraction) (HCC)    a. 2022 Echo: EF 40%; b. 10/2021 Echo: EF 55%; b. 07/2022 MV: No ischemia. EF 31%; c. 08/2022 Echo: EF 35%, mildly dil RV, sev TR.   History of trichomoniasis 05/31/2016   Hyperlipidemia 03/28/2016   Hypertension    NICM (nonischemic cardiomyopathy) (HCC)    Sepsis (HCC) 09/19/2013    Home Medications Prior to Admission medications   Medication Sig Start Date End Date Taking? Authorizing Provider  acetaminophen (TYLENOL) 500 MG tablet Take 1,000 mg by mouth as needed for mild pain (pain score 1-3) or moderate pain (pain score 4-6).    [provider]  albuterol (VENTOLIN HFA) 108 (90 Base) MCG/ACT inhaler Inhale 2 puffs into the lungs every 4 (four) hours as needed. 11/24/22   [provider]  calcitRIOL (ROCALTROL) 0.25 MCG capsule Take 5 capsules (1.25 mcg total) by mouth every Tuesday, Thursday, and Saturday at 6 PM. 07/09/23   Jerald Kief, MD  carvedilol (COREG) 25 MG tablet Take 25 mg by mouth 2 (two) times daily with a meal. 05/15/23   [provider]  Continuous Glucose Sensor (DEXCOM G7 SENSOR) MISC Change sensors every 10 days 09/07/22   Shamleffer, Konrad Dolores, MD  doxycycline (VIBRA-TABS) 100 MG tablet Take 1 tablet (100 mg total) by mouth 2 (two) times daily for 3 days. 07/09/23 07/12/23  Jerald Kief, MD  FLUoxetine (PROZAC) 20 MG capsule Take 1 capsule by mouth daily. 03/23/23   [provider]  HYDROcodone-acetaminophen (NORCO/VICODIN) 5-325 MG tablet Take 1 tablet by mouth every 6 (six) hours as needed for moderate pain (pain score 4-6) or severe pain (pain score 7-10). 07/07/23   Emelia Loron, MD  hydrOXYzine (ATARAX) 25 MG tablet Take 25 mg by mouth in the morning and at bedtime. 05/14/23   [provider]  Insulin Aspart FlexPen (NOVOLOG) 100 UNIT/ML Inject 4 Units into the skin 3 (three) times daily. 05/10/23 07/01/23  [provider]  LANTUS SOLOSTAR 100 UNIT/ML Solostar Pen SMARTSIG:8 Unit(s) SUB-Q Daily    [provider]  losartan (COZAAR) 100 MG tablet Take 1 tablet by mouth daily. 03/25/23   [provider]  ondansetron (ZOFRAN-ODT) 4 MG disintegrating tablet Take 1 tablet (4 mg total) by mouth every 8 (eight) hours as needed for nausea or vomiting. 06/23/23   Laurence Spates, MD  polyethylene glycol (MIRALAX / GLYCOLAX) 17 g packet Take 17 g by mouth daily as needed. 12/07/22   [provider]  rosuvastatin (CRESTOR) 40 MG tablet Take 40 mg by mouth daily. 05/22/22   [provider]  sevelamer carbonate (RENVELA) 800 MG tablet Take 2,400 mg by mouth 3 (three) times daily with meals.    [provider]  SUMAtriptan (IMITREX) 50 MG tablet Take 50 mg by mouth every 2  (two) hours as needed for migraine or headache. 06/20/22   [provider]      Allergies    Cephalexin, Morphine, Penicillins, Benadryl [diphenhydramine], Doxycycline, and Methotrexate derivatives    Review of Systems   Review of Systems  Physical Exam Updated Vital Signs BP (!) 146/101   Pulse 75   Temp 98.6 F (37 C)   Resp 16   LMP  (LMP Unknown)   SpO2 98%  Physical Exam Vitals and nursing note reviewed.  Cardiovascular:     Rate and Rhythm: Normal rate.  Pulmonary:     Breath sounds: No wheezing or rhonchi.  Abdominal:     Tenderness: There is abdominal tenderness.     Comments: Diffuse abdominal tenderness.  Some guarding.  Skin:    General: Skin is warm.  Neurological:     Mental Status: She is alert and oriented to person, place, and time.     ED Results / Procedures / Treatments   Labs (all labs ordered are listed, but only abnormal results are displayed) Labs Reviewed  COMPREHENSIVE METABOLIC PANEL - Abnormal; Notable for the following components:      Result Value   Sodium 132 (*)    Potassium 6.2 (*)    Chloride 96 (*)    CO2 16 (*)    Glucose, Bld 462 (*)    BUN 74 (*)    Creatinine, Ser 8.77 (*)    Calcium 8.8 (*)    AST 45 (*)    ALT 59 (*)    Alkaline Phosphatase 357 (*)    GFR, Estimated 6 (*)    Anion gap 20 (*)    All other components within normal limits  CBC WITH DIFFERENTIAL/PLATELET - Abnormal; Notable for the following components:   RDW 17.1 (*)    All other components within normal limits  CBG MONITORING, ED - Abnormal; Notable for the following components:   Glucose-Capillary 300 (*)    All other components within normal limits  RESP PANEL BY RT-PCR (RSV, FLU A&B, COVID)  RVPGX2  LIPASE, BLOOD  HCG, SERUM, QUALITATIVE    EKG EKG Interpretation Date/Time:  Thursday July 11 2023 11:24:09 EDT Ventricular Rate:  72 PR Interval:  162 QRS Duration:  128 QT Interval:  456 QTC Calculation: 500 R Axis:   60  Text  Interpretation: Sinus rhythm Nonspecific intraventricular conduction delay Confirmed by Benjiman Core [phone]) on 07/11/2023 11:32:22 AM  Radiology DG Chest Portable 1 View Result Date: 07/11/2023 CLINICAL DATA:  Shortness of breath. EXAM: PORTABLE CHEST 1 VIEW COMPARISON:  Chest radiograph dated 06/30/2023. FINDINGS: No focal consolidation, pleural effusion or pneumothorax. Stable cardiomegaly. No acute osseous pathology. IMPRESSION: 1. No active disease. 2. Cardiomegaly. Electronically Signed   By: Elgie Collard M.D.   On: 07/11/2023 15:20    Procedures Procedures    Medications Ordered in  ED Medications  HYDROmorphone (DILAUDID) injection 0.5 mg (0.5 mg Intravenous Given 07/11/23 1141)  hydrOXYzine (ATARAX) tablet 25 mg (25 mg Oral Given 07/11/23 1244)  insulin aspart (novoLOG) injection 4 Units (4 Units Intravenous Given 07/11/23 1316)  iohexol (OMNIPAQUE) 350 MG/ML injection 65 mL (65 mLs Intravenous Contrast Given 07/11/23 1420)    ED Course/ Medical Decision Making/ A&P                                 Medical Decision Making Amount and/or Complexity of Data Reviewed Labs: ordered. Radiology: ordered.  Risk Prescription drug management.   Patient with abdominal pain.  Feeling weak all over.  Afebrile.  Sugar reportedly high.  Dialysis patient that missed both today's dialysis and her treatment 2 days ago.  Differential diagnosis does include infection such as COVID and flu.  Also intra-abdominal pathology due to recent surgery.  Will get basic blood work.  Will get CT scan of abdomen and pelvis.  Afebrile here.  Will also get chest x-ray  Chest x-ray reassuring.  Blood work does show hyperkalemia.  Also hyperglycemia.  Insulin given.  EKG reassuring.  CT scan done but results pending.  Will require admission to hospital.  Care turned over to Dr. Hyacinth Meeker.        Final Clinical Impression(s) / ED Diagnoses Final diagnoses:  End stage renal disease on dialysis  [facility])  Postoperative pain    Rx / DC Orders ED Discharge Orders     None         Benjiman Core, MD 07/11/23 1545

## 2023-07-11 NOTE — ED Notes (Signed)
 Patient was given something to eat.

## 2023-07-11 NOTE — ED Triage Notes (Signed)
 Pt bib ptar from home; c/o generalized body aches that started at Midnight; dialysis pt, last full dialysis Saturday; has cholecystectomy last Tuesday; cbg 496; emesis yesterday; 150/100, 98% RA, HR 76; pt also states she has extra fluid on her, endorses sob and cough; denies known known sick contacts, denies fevers; endorses med compliance

## 2023-07-11 NOTE — ED Provider Notes (Signed)
 Patient was accepted at change of shift, she is a 32 year old patient who has a history of end-stage renal disease on dialysis, she missed on Tuesday and missed today because she was not feeling well.  She has had some abdominal pain after having a cholecystectomy, thankfully the CT scan here does not show any signs of postoperative complications.  She has a small amount of fluid left in the gallbladder fossa.  Will discuss with general surgery.  Her potassium is 6.2, she will need dialysis, I consulted with nephrology and they will arrange this.  Knowing that she has a nonsurgical abdomen, vital signs without fever or tachycardia and no leukocytosis I suspect that the patient can go home after dialysis is completed.   Eber Hong, MD 07/12/23 1455

## 2023-07-12 LAB — HEPATITIS B SURFACE ANTIBODY, QUANTITATIVE: Hep B S AB Quant (Post): 4.7 m[IU]/mL — ABNORMAL LOW

## 2023-07-12 NOTE — ED Provider Notes (Signed)
 Patient has returned from dialysis.  Overall improved.  Does continue to endorse some bodyaches but no other symptoms.  Wishes to be discharged home.  Hemodynamically stable.  Hypertensive at her baseline.   Shon Baton, MD 07/12/23 [phone]

## 2023-07-12 NOTE — ED Notes (Signed)
 AVS provided by edp was reviewed with the pt. Pt was able to verbalize understanding with no additional questions at this time.

## 2023-07-15 ENCOUNTER — Emergency Department (HOSPITAL_COMMUNITY)

## 2023-07-15 ENCOUNTER — Emergency Department (HOSPITAL_COMMUNITY)
Admission: EM | Admit: 2023-07-15 | Discharge: 2023-07-16 | Disposition: A | Discharge status: Home / Self Care | Attending: Student | Admitting: Student

## 2023-07-15 ENCOUNTER — Encounter (HOSPITAL_COMMUNITY): Payer: Self-pay

## 2023-07-15 DIAGNOSIS — I5042 Chronic combined systolic (congestive) and diastolic (congestive) heart failure: Secondary | ICD-10-CM | POA: Diagnosis not present

## 2023-07-15 DIAGNOSIS — E1010 Type 1 diabetes mellitus with ketoacidosis without coma: Secondary | ICD-10-CM | POA: Diagnosis not present

## 2023-07-15 DIAGNOSIS — N186 End stage renal disease: Secondary | ICD-10-CM | POA: Insufficient documentation

## 2023-07-15 DIAGNOSIS — R109 Unspecified abdominal pain: Secondary | ICD-10-CM | POA: Diagnosis present

## 2023-07-15 DIAGNOSIS — Z794 Long term (current) use of insulin: Secondary | ICD-10-CM | POA: Insufficient documentation

## 2023-07-15 DIAGNOSIS — R0789 Other chest pain: Secondary | ICD-10-CM | POA: Insufficient documentation

## 2023-07-15 DIAGNOSIS — W19XXXA Unspecified fall, initial encounter: Secondary | ICD-10-CM | POA: Insufficient documentation

## 2023-07-15 DIAGNOSIS — I132 Hypertensive heart and chronic kidney disease with heart failure and with stage 5 chronic kidney disease, or end stage renal disease: Secondary | ICD-10-CM | POA: Diagnosis not present

## 2023-07-15 DIAGNOSIS — R531 Weakness: Secondary | ICD-10-CM | POA: Diagnosis not present

## 2023-07-15 DIAGNOSIS — E1022 Type 1 diabetes mellitus with diabetic chronic kidney disease: Secondary | ICD-10-CM | POA: Insufficient documentation

## 2023-07-15 DIAGNOSIS — W501XXA Accidental kick by another person, initial encounter: Secondary | ICD-10-CM | POA: Diagnosis not present

## 2023-07-15 DIAGNOSIS — D631 Anemia in chronic kidney disease: Secondary | ICD-10-CM | POA: Insufficient documentation

## 2023-07-15 DIAGNOSIS — Z992 Dependence on renal dialysis: Secondary | ICD-10-CM | POA: Diagnosis not present

## 2023-07-15 DIAGNOSIS — Z79899 Other long term (current) drug therapy: Secondary | ICD-10-CM | POA: Diagnosis not present

## 2023-07-15 MED ORDER — IOHEXOL 350 MG/ML SOLN
75.0000 mL | Freq: Once | INTRAVENOUS | Status: AC | PRN
  Administered 2023-07-15: 75 mL via INTRAVENOUS

## 2023-07-15 MED ORDER — FENTANYL CITRATE PF 50 MCG/ML IJ SOSY
100.0000 ug | PREFILLED_SYRINGE | Freq: Once | INTRAMUSCULAR | Status: DC
  Filled 2023-07-15: qty 2

## 2023-07-15 MED ORDER — FENTANYL CITRATE PF 50 MCG/ML IJ SOSY
100.0000 ug | PREFILLED_SYRINGE | Freq: Once | INTRAMUSCULAR | Status: AC
  Administered 2023-07-15: 100 ug via INTRAMUSCULAR
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
## 2023-07-15 NOTE — ED Notes (Signed)
 Pt to CT

## 2023-07-16 ENCOUNTER — Other Ambulatory Visit: Payer: Self-pay

## 2023-07-16 ENCOUNTER — Encounter (HOSPITAL_COMMUNITY): Payer: Self-pay

## 2023-07-16 ENCOUNTER — Emergency Department (HOSPITAL_COMMUNITY)

## 2023-07-16 ENCOUNTER — Emergency Department (HOSPITAL_COMMUNITY)
Admission: EM | Admit: 2023-07-16 | Discharge: 2023-07-17 | Disposition: A | Discharge status: Home / Self Care | Source: Home / Self Care

## 2023-07-16 DIAGNOSIS — I120 Hypertensive chronic kidney disease with stage 5 chronic kidney disease or end stage renal disease: Secondary | ICD-10-CM | POA: Insufficient documentation

## 2023-07-16 DIAGNOSIS — Z992 Dependence on renal dialysis: Secondary | ICD-10-CM | POA: Insufficient documentation

## 2023-07-16 DIAGNOSIS — R109 Unspecified abdominal pain: Secondary | ICD-10-CM | POA: Insufficient documentation

## 2023-07-16 DIAGNOSIS — Z79899 Other long term (current) drug therapy: Secondary | ICD-10-CM | POA: Insufficient documentation

## 2023-07-16 DIAGNOSIS — N186 End stage renal disease: Secondary | ICD-10-CM | POA: Insufficient documentation

## 2023-07-16 DIAGNOSIS — W501XXA Accidental kick by another person, initial encounter: Secondary | ICD-10-CM | POA: Insufficient documentation

## 2023-07-16 DIAGNOSIS — E1122 Type 2 diabetes mellitus with diabetic chronic kidney disease: Secondary | ICD-10-CM | POA: Insufficient documentation

## 2023-07-16 LAB — CBC WITH DIFFERENTIAL/PLATELET
Abs Immature Granulocytes: 0.02 10*3/uL (ref 0.00–0.07)
Abs Immature Granulocytes: 0.02 10*3/uL (ref 0.00–0.07)
Basophils Absolute: 0.1 10*3/uL (ref 0.0–0.1)
Basophils Absolute: 0.1 10*3/uL (ref 0.0–0.1)
Basophils Relative: 1 %
Basophils Relative: 1 %
Eosinophils Absolute: 0.4 10*3/uL (ref 0.0–0.5)
Eosinophils Absolute: 0.5 10*3/uL (ref 0.0–0.5)
Eosinophils Relative: 5 %
Eosinophils Relative: 6 %
HCT: 39.4 % (ref 36.0–46.0)
HCT: 39.6 % (ref 36.0–46.0)
Hemoglobin: 12.1 g/dL (ref 12.0–15.0)
Hemoglobin: 12.1 g/dL (ref 12.0–15.0)
Immature Granulocytes: 0 %
Immature Granulocytes: 0 %
Lymphocytes Relative: 43 %
Lymphocytes Relative: 43 %
Lymphs Abs: 3.6 10*3/uL (ref 0.7–4.0)
Lymphs Abs: 3.9 10*3/uL (ref 0.7–4.0)
MCH: 26.2 pg (ref 26.0–34.0)
MCH: 26.3 pg (ref 26.0–34.0)
MCHC: 30.6 g/dL (ref 30.0–36.0)
MCHC: 30.7 g/dL (ref 30.0–36.0)
MCV: 85.3 fL (ref 80.0–100.0)
MCV: 86.1 fL (ref 80.0–100.0)
Monocytes Absolute: 0.5 10*3/uL (ref 0.1–1.0)
Monocytes Absolute: 0.6 10*3/uL (ref 0.1–1.0)
Monocytes Relative: 6 %
Monocytes Relative: 7 %
Neutro Abs: 3.5 10*3/uL (ref 1.7–7.7)
Neutro Abs: 4 10*3/uL (ref 1.7–7.7)
Neutrophils Relative %: 43 %
Neutrophils Relative %: 45 %
Platelets: 312 10*3/uL (ref 150–400)
Platelets: 316 10*3/uL (ref 150–400)
RBC: 4.6 MIL/uL (ref 3.87–5.11)
RBC: 4.62 MIL/uL (ref 3.87–5.11)
RDW: 17.3 % — ABNORMAL HIGH (ref 11.5–15.5)
RDW: 17.5 % — ABNORMAL HIGH (ref 11.5–15.5)
WBC: 8.2 10*3/uL (ref 4.0–10.5)
WBC: 9 10*3/uL (ref 4.0–10.5)
nRBC: 0 % (ref 0.0–0.2)
nRBC: 0.2 % (ref 0.0–0.2)

## 2023-07-16 LAB — COMPREHENSIVE METABOLIC PANEL
ALT: 31 U/L (ref 0–44)
ALT: 64 U/L — ABNORMAL HIGH (ref 0–44)
AST: 27 U/L (ref 15–41)
AST: 145 U/L — ABNORMAL HIGH (ref 15–41)
Albumin: 2.9 g/dL — ABNORMAL LOW (ref 3.5–5.0)
Albumin: 3.4 g/dL — ABNORMAL LOW (ref 3.5–5.0)
Alkaline Phosphatase: 253 U/L — ABNORMAL HIGH (ref 38–126)
Alkaline Phosphatase: 283 U/L — ABNORMAL HIGH (ref 38–126)
Anion gap: 14 (ref 5–15)
Anion gap: 16 — ABNORMAL HIGH (ref 5–15)
BUN: 26 mg/dL — ABNORMAL HIGH (ref 6–20)
BUN: 29 mg/dL — ABNORMAL HIGH (ref 6–20)
CO2: 23 mmol/L (ref 22–32)
CO2: 22 mmol/L (ref 22–32)
Calcium: 7.7 mg/dL — ABNORMAL LOW (ref 8.9–10.3)
Calcium: 8.6 mg/dL — ABNORMAL LOW (ref 8.9–10.3)
Chloride: 100 mmol/L (ref 98–111)
Chloride: 100 mmol/L (ref 98–111)
Creatinine, Ser: 6.55 mg/dL — ABNORMAL HIGH (ref 0.44–1.00)
Creatinine, Ser: 7.54 mg/dL — ABNORMAL HIGH (ref 0.44–1.00)
GFR, Estimated: 8 mL/min — ABNORMAL LOW (ref >60)
GFR, Estimated: 7 mL/min — ABNORMAL LOW (ref >60)
Glucose, Bld: 169 mg/dL — ABNORMAL HIGH (ref 70–99)
Glucose, Bld: 145 mg/dL — ABNORMAL HIGH (ref 70–99)
Potassium: 3.6 mmol/L (ref 3.5–5.1)
Potassium: 3.9 mmol/L (ref 3.5–5.1)
Sodium: 137 mmol/L (ref 135–145)
Sodium: 138 mmol/L (ref 135–145)
Total Bilirubin: 0.7 mg/dL (ref 0.0–1.2)
Total Bilirubin: 0.7 mg/dL (ref 0.0–1.2)
Total Protein: 5.5 g/dL — ABNORMAL LOW (ref 6.5–8.1)
Total Protein: 6.5 g/dL (ref 6.5–8.1)

## 2023-07-16 LAB — LIPASE, BLOOD: Lipase: 22 U/L (ref 11–51)

## 2023-07-16 LAB — CBG MONITORING, ED: Glucose-Capillary: 148 mg/dL — ABNORMAL HIGH (ref 70–99)

## 2023-07-16 LAB — HCG, SERUM, QUALITATIVE: Preg, Serum: NEGATIVE

## 2023-07-16 MED ORDER — FENTANYL CITRATE PF 50 MCG/ML IJ SOSY
50.0000 ug | PREFILLED_SYRINGE | Freq: Once | INTRAMUSCULAR | Status: AC
  Administered 2023-07-16: 50 ug via INTRAVENOUS
  Filled 2023-07-16: qty 1

## 2023-07-16 MED ORDER — DIPHENHYDRAMINE HCL 50 MG/ML IJ SOLN
25.0000 mg | Freq: Once | INTRAMUSCULAR | Status: DC
  Filled 2023-07-16: qty 1

## 2023-07-16 MED ORDER — OXYCODONE-ACETAMINOPHEN 5-325 MG PO TABS
1.0000 | ORAL_TABLET | Freq: Once | ORAL | Status: AC
  Administered 2023-07-16: 1 via ORAL
  Filled 2023-07-16: qty 1

## 2023-07-16 MED ORDER — PROCHLORPERAZINE EDISYLATE 10 MG/2ML IJ SOLN
10.0000 mg | Freq: Once | INTRAMUSCULAR | Status: DC
  Filled 2023-07-16: qty 2

## 2023-07-16 MED ORDER — HYDROCODONE-ACETAMINOPHEN 5-325 MG PO TABS: 1.0000 | ORAL_TABLET | Freq: Four times a day (QID) | ORAL | 0 refills | Status: DC | PRN

## 2023-07-16 NOTE — Discharge Instructions (Signed)
 Your workup today was reassuring.  Please follow-up with your surgeon for reevaluation but there were no acute findings on your CT scan today.  Please take the hydrocodone as needed for pain.  Do not drive drink alcohol taking this.  Return to the ER for worsening symptoms.  Your labs are reassuring but please call your dialysis center tomorrow to see if they would like to get you dialyzed tomorrow since she missed dialysis today.
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
## 2023-07-16 NOTE — ED Provider Notes (Signed)
 [place] EMERGENCY DEPARTMENT AT [facility] Provider Note  CSN: [id number] Arrival date & time: 07/15/23 2036  Chief Complaint(s) Fall  HPI Ashley Freeman is a 33 y.o. female with T1DM, PMH ESRD on hemodialysis Tuesday Thursday Saturday, CHF, nonischemic cardiomyopathy, recent cholecystectomy on 07/02/2023 who presents emergency room for evaluation of a fall.  Patient states she was walking in the living room when she felt sudden onset weakness in her knees causing her to fall onto the left side of her body.  Struck her stool.  Endorsing significant pain over the left chest wall and in the abdomen.  Denies nausea, vomiting, headache, numbness continuing, weakness or other systemic symptoms.  No head strike or loss of consciousness.  No blood thinner use   Past Medical History Past Medical History:  Diagnosis Date   Abscess, gluteal, right 08/24/2013   Anemia 02/19/2012   Bartholin's gland abscess 09/19/2013   Bipolar disorder (HCC)    BV (bacterial vaginosis) 11/24/2015   Depression    Diabetes mellitus type I (HCC) 2001   Diagnosed at age 55 ; Type I   Diarrhea 05/30/2016   DKA (diabetic ketoacidoses) 08/19/2013   Also in 2018   ESRD (end stage renal disease) (HCC)    Gonorrhea 08/2011   Treated in 09/2011   HFrEF (heart failure with reduced ejection fraction) (HCC)    a. 2022 Echo: EF 40%; b. 10/2021 Echo: EF 55%; b. 07/2022 MV: No ischemia. EF 31%; c. 08/2022 Echo: EF 35%, mildly dil RV, sev TR.   History of trichomoniasis 05/31/2016   Hyperlipidemia 03/28/2016   Hypertension    NICM (nonischemic cardiomyopathy) (HCC)    Sepsis (HCC) 09/19/2013   Patient Active Problem List   Diagnosis Date Noted   Cholecystitis 06/30/2023   Prolonged QT interval 06/30/2023   Bipolar disorder (HCC) 06/30/2023   Hyperglycemic crisis due to Type 1 diabetes mellitus (HCC) 05/20/2023   HFrEF (heart failure with reduced ejection fraction) (HCC) 05/20/2023   Chronic  combined systolic and diastolic heart failure (HCC) 05/20/2023   Generalized pain 05/20/2023   Hypoglycemia 09/08/2022   Dilated cardiomyopathy (HCC) 09/08/2022   Seizures (HCC) 09/08/2022   ESRD on hemodialysis (HCC) 09/08/2022   Altered mental status 09/07/2022   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 difficile diarrhea 11/15/2021   SIRS (systemic inflammatory response syndrome) (HCC) 11/06/2021   LGSIL on Pap smear of cervix 09/24/2021   Stage 3b chronic kidney disease (HCC) 08/14/2021   DKA (diabetic ketoacidosis) (HCC) 07/26/2021   Essential hypertension 07/26/2021   Acute renal failure superimposed on stage 3b chronic kidney disease (HCC) 06/20/2021   N&V (nausea and vomiting) 06/20/2021   AKI (acute kidney injury) (HCC) 06/20/2021   Type 1 diabetes mellitus with chronic kidney disease on chronic dialysis (HCC) 05/26/2021   Dyslipidemia 05/26/2021   Anasarca 04/16/2021   Mild protein malnutrition (HCC) 03/07/2021   DKA, type 1 (HCC) 12/08/2020   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 diabetes mellitus (HCC) 09/17/2020   Type 1 diabetes mellitus (HCC) 02/08/2020   Systolic ejection murmur 08/03/2019   Type 1 diabetes mellitus with hyperglycemia (  HCC) 07/24/2019   Diabetic retinopathy (HCC) 07/02/2019   Proteinuria due to type 1 diabetes mellitus (HCC) 05/21/2019   Diarrhea 05/31/2016   Diabetic neuropathy (HCC) 01/16/2016   Preop cardiovascular exam 08/24/2013   Pseudohyponatremia 08/04/2012   Hypokalemia 05/07/2012   Lactic acidosis 02/19/2012   Leukocytosis 02/19/2012   Normocytic anemia 02/19/2012   Hyperkalemia 02/19/2012    Stable proliferative diabetic retinopathy associated with type 1 diabetes mellitus (HCC) 10/13/1997   Home Medication(s) Prior to Admission medications   Medication Sig Start Date End Date Taking? Authorizing Provider  acetaminophen (TYLENOL) 500 MG tablet Take 1,000 mg by mouth as needed for mild pain (pain score 1-3) or moderate pain (pain score 4-6).    [provider]  albuterol (VENTOLIN HFA) 108 (90 Base) MCG/ACT inhaler Inhale 2 puffs into the lungs every 4 (four) hours as needed. 11/24/22   [provider]  calcitRIOL (ROCALTROL) 0.25 MCG capsule Take 5 capsules (1.25 mcg total) by mouth every Tuesday, Thursday, and Saturday at 6 PM. 07/09/23   Jerald Kief, MD  carvedilol (COREG) 25 MG tablet Take 25 mg by mouth 2 (two) times daily with a meal. 05/15/23   [provider]  Continuous Glucose Sensor (DEXCOM G7 SENSOR) MISC Change sensors every 10 days 09/07/22   Shamleffer, Konrad Dolores, MD  FLUoxetine (PROZAC) 20 MG capsule Take 1 capsule by mouth daily. 03/23/23   [provider]  HYDROcodone-acetaminophen (NORCO/VICODIN) 5-325 MG tablet Take 1 tablet by mouth every 6 (six) hours as needed for moderate pain (pain score 4-6) or severe pain (pain score 7-10). 07/07/23   Emelia Loron, MD  hydrOXYzine (ATARAX) 25 MG tablet Take 25 mg by mouth in the morning and at bedtime. 05/14/23   [provider]  Insulin Aspart FlexPen (NOVOLOG) 100 UNIT/ML Inject 4 Units into the skin 3 (three) times daily. 05/10/23 07/01/23  [provider]  LANTUS SOLOSTAR 100 UNIT/ML Solostar Pen SMARTSIG:8 Unit(s) SUB-Q Daily    [provider]  losartan (COZAAR) 100 MG tablet Take 1 tablet by mouth daily. 03/25/23   [provider]  ondansetron (ZOFRAN-ODT) 4 MG disintegrating tablet Take 1 tablet (4 mg total) by mouth every 8 (eight) hours as needed for nausea or vomiting. 06/23/23   Laurence Spates, MD  polyethylene glycol (MIRALAX /  GLYCOLAX) 17 g packet Take 17 g by mouth daily as needed. 12/07/22   [provider]  rosuvastatin (CRESTOR) 40 MG tablet Take 40 mg by mouth daily. 05/22/22   [provider]  sevelamer carbonate (RENVELA) 800 MG tablet Take 2,400 mg by mouth 3 (three) times daily with meals.    [provider]  SUMAtriptan (IMITREX) 50 MG tablet Take 50 mg by mouth every 2 (two) hours as needed for migraine or headache. 06/20/22   [provider]  Past Surgical History Past Surgical History:  Procedure Laterality Date   A/V FISTULAGRAM Right 06/17/2023   Procedure: A/V Fistulagram;  Surgeon: Annice Needy, MD;  Location: [facility] INVASIVE CV LAB;  Service: Cardiovascular;  Laterality: Right;   AV FISTULA PLACEMENT Right 07/06/2022   Procedure: ARTERIOVENOUS GRAFT CREATION;  Surgeon: Cephus Shelling, MD;  Location: [facility] OR;  Service: Vascular;  Laterality: Right;   CESAREAN SECTION N/A 10/05/2019   Procedure: CESAREAN SECTION;  Surgeon: Rowan Bing, MD;  Location: [facility] LD ORS;  Service: Obstetrics;  Laterality: N/A;   CHOLECYSTECTOMY N/A 07/02/2023   Procedure: LAPAROSCOPIC CHOLECYSTECTOMY;  Surgeon: Emelia Loron, MD;  Location: [facility] OR;  Service: General;  Laterality: N/A;   INCISION AND DRAINAGE ABSCESS Left 09/28/2019   Procedure: INCISION AND DRAINAGE VULVAR ABCESS;  Surgeon: Tilda Burrow, MD;  Location: [facility] OR;  Service: Gynecology;  Laterality: Left;   INCISION AND DRAINAGE PERIRECTAL ABSCESS Right 08/18/2013   Procedure: IRRIGATION AND DEBRIDEMENT GLUTEAL ABSCESS;  Surgeon: Axel Filler, MD;  Location: [facility] OR;  Service: General;  Laterality: Right;   INCISION AND DRAINAGE PERIRECTAL ABSCESS Right 09/19/2013   Procedure: IRRIGATION AND DEBRIDEMENT RIGHT GLUTEAL AND LABIAL ABSCESSES;  Surgeon: Axel Filler, MD;  Location: [facility] OR;  Service:  General;  Laterality: Right;   INCISION AND DRAINAGE PERIRECTAL ABSCESS Right 09/24/2013   Procedure: IRRIGATION AND DEBRIDEMENT PERIRECTAL ABSCESS;  Surgeon: Cherylynn Ridges, MD;  Location: [facility] OR;  Service: General;  Laterality: Right;   Family History Family History  Problem Relation Age of Onset   Asthma Mother    Carpal tunnel syndrome Mother    Gout Father    Diabetes Paternal Grandmother    Anesthesia problems Neg Hx     Social History Social History   Tobacco Use   Smoking status: Never    Passive exposure: Never   Smokeless tobacco: Never  Vaping Use   Vaping status: Never Used  Substance Use Topics   Alcohol use: Not Currently   Drug use: No   Allergies Cephalexin, Morphine, Penicillins, Dilaudid [hydromorphone], Benadryl [diphenhydramine], Doxycycline, and Methotrexate derivatives  Review of Systems Review of Systems  Cardiovascular:  Positive for chest pain.  Gastrointestinal:  Positive for abdominal pain.    Physical Exam Vital Signs  I have reviewed the triage vital signs BP (!) 158/93 (BP Location: Left Wrist)   Pulse 73   Temp 98.1 F (36.7 C) (Oral)   Resp 15   Ht 5\' 3"  (1.6 m)   Wt 59 kg   LMP  (LMP Unknown)   SpO2 99%   Breastfeeding Unknown   BMI 23.03 kg/m   Physical Exam Vitals and nursing note reviewed.  Constitutional:      General: She is not in acute distress.    Appearance: She is well-developed.  HENT:     Head: Normocephalic and atraumatic.  Eyes:     Conjunctiva/sclera: Conjunctivae normal.  Cardiovascular:     Rate and Rhythm: Normal rate and regular rhythm.     Heart sounds: No murmur heard. Pulmonary:     Effort: Pulmonary effort is normal. No respiratory distress.     Breath sounds: Normal breath sounds.  Abdominal:     Palpations: Abdomen is soft.     Tenderness: There is abdominal tenderness.  Musculoskeletal:        General: Tenderness present. No swelling.     Cervical back: Neck supple.  Skin:    General:  Skin is warm and dry.  Capillary Refill: Capillary refill takes less than 2 seconds.  Neurological:     Mental Status: She is alert.  Psychiatric:        Mood and Affect: Mood normal.     ED Results and Treatments Labs (all labs ordered are listed, but only abnormal results are displayed) Labs Reviewed  COMPREHENSIVE METABOLIC PANEL - Abnormal; Notable for the following components:      Result Value   Glucose, Bld 169 (*)    BUN 26 (*)    Creatinine, Ser 6.55 (*)    Calcium 7.7 (*)    Total Protein 5.5 (*)    Albumin 2.9 (*)    Alkaline Phosphatase 253 (*)    GFR, Estimated 8 (*)    All other components within normal limits  CBC WITH DIFFERENTIAL/PLATELET - Abnormal; Notable for the following components:   RDW 17.3 (*)    All other components within normal limits                                                                                                                          Radiology CT CHEST ABDOMEN PELVIS W CONTRAST Result Date: 07/15/2023 CLINICAL DATA:  Polytrauma, blunt.  Fall. EXAM: CT CHEST, ABDOMEN, AND PELVIS WITH CONTRAST TECHNIQUE: Multidetector CT imaging of the chest, abdomen and pelvis was performed following the standard protocol during bolus administration of intravenous contrast. RADIATION DOSE REDUCTION: This exam was performed according to the departmental dose-optimization program which includes automated exposure control, adjustment of the mA and/or kV according to patient size and/or use of iterative reconstruction technique. CONTRAST:  75mL OMNIPAQUE IOHEXOL 350 MG/ML SOLN COMPARISON:  07/11/2023 FINDINGS: CT CHEST FINDINGS Cardiovascular: Mild cardiomegaly.  Aorta normal caliber. Mediastinum/Nodes: No mediastinal, hilar, or axillary adenopathy. Trachea and esophagus are unremarkable. Thyroid unremarkable. Lungs/Pleura: Linear scarring or atelectasis in the lingula. No confluent opacities, effusions or pneumothorax. Musculoskeletal: Chest wall soft  tissues are unremarkable. No acute bony abnormality. CT ABDOMEN PELVIS FINDINGS Hepatobiliary: Prior cholecystectomy. Small fluid collection again noted in the gallbladder fossa, likely postoperative hematoma related to recent cholecystectomy. No real change since prior study. Diffuse low-density throughout the liver compatible with fatty infiltration. Trace perihepatic ascites is stable since prior study. Pancreas: No focal abnormality or ductal dilatation. Spleen: No splenic injury or perisplenic hematoma. Adrenals/Urinary Tract: No adrenal hemorrhage or renal injury identified. Bladder is unremarkable. Stomach/Bowel: Stomach, large and small bowel grossly unremarkable. Vascular/Lymphatic: No evidence of aneurysm or adenopathy. Reproductive: Uterus and adnexa unremarkable.  No mass. Other: Trace right perihepatic ascites and moderate pelvic ascites, stable since prior study. No free air. Musculoskeletal: No acute bony abnormality. IMPRESSION: No evidence of significant traumatic injury in the chest, abdomen or pelvis. Hepatic steatosis. Recent cholecystectomy. Small residual fluid collection in the gallbladder fossa likely reflects postoperative fluid collection/hematoma, not significantly changed since prior study. Cardiomegaly. Ascites in the abdomen and adjacent to the liver, stable since prior study. Electronically Signed   By: Charlett Nose M.D.  On: 07/15/2023 23:52    Pertinent labs & imaging results that were available during my care of the patient were reviewed by me and considered in my medical decision making (see MDM for details).  Medications Ordered in ED Medications  fentaNYL (SUBLIMAZE) injection 100 mcg (100 mcg Intramuscular Given 07/15/23 2240)  iohexol (OMNIPAQUE) 350 MG/ML injection 75 mL (75 mLs Intravenous Contrast Given 07/15/23 2341)  oxyCODONE-acetaminophen (PERCOCET/ROXICET) 5-325 MG per tablet 1 tablet (1 tablet Oral Given 07/16/23 0137)                                                                                                                                      Procedures Procedures  (including critical care time)  Medical Decision Making / ED Course   This patient presents to the ED for concern of fall, this involves an extensive number of treatment options, and is a complaint that carries with it a high risk of complications and morbidity.  The differential diagnosis includes fracture, contusion, hematoma, ligamentous injury, closed head injury, ICH, laceration, intrathoracic injury, intra-abdominal injury  MDM: Patient seen emergency room for evaluation of a fall.  Physical exam with tenderness over bilateral chest wall, generally in the abdomen but is otherwise unremarkable.  No external signs of bruising or trauma.  Neurologic exam unremarkable with no focal motor or sensory deficits.  No cranial nerve deficits.  Patient pending laboratory valuation and imaging studies at time of signout.  Please see provider signout note for continuation of workup.   Additional history obtained:  -External records from outside source obtained and reviewed including: Chart review including previous notes, labs, imaging, consultation notes   Lab Tests: -I ordered, reviewed, and interpreted labs.   The pertinent results include:   Labs Reviewed  COMPREHENSIVE METABOLIC PANEL - Abnormal; Notable for the following components:      Result Value   Glucose, Bld 169 (*)    BUN 26 (*)    Creatinine, Ser 6.55 (*)    Calcium 7.7 (*)    Total Protein 5.5 (*)    Albumin 2.9 (*)    Alkaline Phosphatase 253 (*)    GFR, Estimated 8 (*)    All other components within normal limits  CBC WITH DIFFERENTIAL/PLATELET - Abnormal; Notable for the following components:   RDW 17.3 (*)    All other components within normal limits      Imaging Studies ordered: I ordered imaging studies including CT chest abdomen pelvis and this is pending   Medicines ordered and prescription drug  management: Meds ordered this encounter  Medications   DISCONTD: fentaNYL (SUBLIMAZE) injection 100 mcg   fentaNYL (SUBLIMAZE) injection 100 mcg   iohexol (OMNIPAQUE) 350 MG/ML injection 75 mL   DISCONTD: diphenhydrAMINE (BENADRYL) injection 25 mg   DISCONTD: prochlorperazine (COMPAZINE) injection 10 mg   oxyCODONE-acetaminophen (PERCOCET/ROXICET) 5-325 MG per tablet 1 tablet    Refill:  0    -  I have reviewed the patients home medicines and have made adjustments as needed  Critical interventions none   Social Determinants of Health:  Factors impacting patients care include: none   Reevaluation: After the interventions noted above, I reevaluated the patient and found that they have :improved  Co morbidities that complicate the patient evaluation  Past Medical History:  Diagnosis Date   Abscess, gluteal, right 08/24/2013   Anemia 02/19/2012   Bartholin's gland abscess 09/19/2013   Bipolar disorder (HCC)    BV (bacterial vaginosis) 11/24/2015   Depression    Diabetes mellitus type I (HCC) 2001   Diagnosed at age 65 ; Type I   Diarrhea 05/30/2016   DKA (diabetic ketoacidoses) 08/19/2013   Also in 2018   ESRD (end stage renal disease) (HCC)    Gonorrhea 08/2011   Treated in 09/2011   HFrEF (heart failure with reduced ejection fraction) (HCC)    a. 2022 Echo: EF 40%; b. 10/2021 Echo: EF 55%; b. 07/2022 MV: No ischemia. EF 31%; c. 08/2022 Echo: EF 35%, mildly dil RV, sev TR.   History of trichomoniasis 05/31/2016   Hyperlipidemia 03/28/2016   Hypertension    NICM (nonischemic cardiomyopathy) (HCC)    Sepsis (HCC) 09/19/2013      Dispostion: I considered admission for this patient, and disposition pending laboratory evaluation and imaging studies.  Please see provider signout note for continuation of workup.     Final Clinical Impression(s) / ED Diagnoses Final diagnoses:  Fall, initial encounter     @PCDICTATION @    Korra Christine, Wyn Forster, MD 07/16/23 [phone]

## 2023-07-16 NOTE — Discharge Instructions (Signed)
 You were evaluated in the Emergency Department and after careful evaluation, we did not find any emergent condition requiring admission or further testing in the hospital.  Your exam/testing today is overall reassuring.  Recommend follow-up with your regular doctor and/or surgeon to discuss your symptoms.  Please return to the Emergency Department if you experience any worsening of your condition.   Thank you for allowing [place] to be a part of your care.

## 2023-07-16 NOTE — ED Provider Notes (Signed)
  Provider Note MRN:  [id number]  Arrival date & time: 07/16/23    ED Course and Medical Decision Making  Assumed care of patient at sign-out or upon transfer.  Fall, complaining of abdominal discomfort, recent cholecystectomy awaiting CT.  Patient also concerned that her blood sugars have been elevated lately, awaiting labs.  1:30 AM update: CT is without acute injuries, labs are without evidence of DKA or other concerns.  Patient is well-appearing with normal vital signs.  Patient is appropriate for discharge.  Procedures  Final Clinical Impressions(s) / ED Diagnoses     ICD-10-CM   1. Fall, initial encounter  W[facility]       ED Discharge Orders     None         Discharge Instructions      You were evaluated in the Emergency Department and after careful evaluation, we did not find any emergent condition requiring admission or further testing in the hospital.  Your exam/testing today is overall reassuring.  Recommend follow-up with your regular doctor and/or surgeon to discuss your symptoms.  Please return to the Emergency Department if you experience any worsening of your condition.   Thank you for allowing [place] to be a part of your care.    Elmer Sow. Pilar Plate, MD [facility] Emergency Medicine [facility] [email or web address]    Sabas Sous, MD 07/16/23 [phone]
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
## 2023-07-19 ENCOUNTER — Inpatient Hospital Stay (HOSPITAL_COMMUNITY)
Admission: EM | Admit: 2023-07-19 | Discharge: 2023-07-23 | DRG: 391 | Disposition: A | Discharge status: Home / Self Care | Attending: Internal Medicine | Admitting: Internal Medicine

## 2023-07-19 ENCOUNTER — Emergency Department (HOSPITAL_COMMUNITY)

## 2023-07-19 ENCOUNTER — Other Ambulatory Visit: Payer: Self-pay

## 2023-07-19 ENCOUNTER — Encounter (HOSPITAL_COMMUNITY): Payer: Self-pay

## 2023-07-19 DIAGNOSIS — E1040 Type 1 diabetes mellitus with diabetic neuropathy, unspecified: Secondary | ICD-10-CM | POA: Diagnosis present

## 2023-07-19 DIAGNOSIS — R188 Other ascites: Secondary | ICD-10-CM | POA: Diagnosis present

## 2023-07-19 DIAGNOSIS — Z881 Allergy status to other antibiotic agents status: Secondary | ICD-10-CM

## 2023-07-19 DIAGNOSIS — I5023 Acute on chronic systolic (congestive) heart failure: Secondary | ICD-10-CM | POA: Diagnosis present

## 2023-07-19 DIAGNOSIS — Z1152 Encounter for screening for COVID-19: Secondary | ICD-10-CM

## 2023-07-19 DIAGNOSIS — S301XXA Contusion of abdominal wall, initial encounter: Secondary | ICD-10-CM | POA: Diagnosis present

## 2023-07-19 DIAGNOSIS — R1084 Generalized abdominal pain: Principal | ICD-10-CM

## 2023-07-19 DIAGNOSIS — G8918 Other acute postprocedural pain: Secondary | ICD-10-CM

## 2023-07-19 DIAGNOSIS — I5020 Unspecified systolic (congestive) heart failure: Secondary | ICD-10-CM | POA: Diagnosis present

## 2023-07-19 DIAGNOSIS — I5042 Chronic combined systolic (congestive) and diastolic (congestive) heart failure: Secondary | ICD-10-CM | POA: Diagnosis present

## 2023-07-19 DIAGNOSIS — E10649 Type 1 diabetes mellitus with hypoglycemia without coma: Secondary | ICD-10-CM | POA: Diagnosis present

## 2023-07-19 DIAGNOSIS — D631 Anemia in chronic kidney disease: Secondary | ICD-10-CM | POA: Diagnosis present

## 2023-07-19 DIAGNOSIS — E1022 Type 1 diabetes mellitus with diabetic chronic kidney disease: Secondary | ICD-10-CM | POA: Diagnosis present

## 2023-07-19 DIAGNOSIS — Z885 Allergy status to narcotic agent status: Secondary | ICD-10-CM

## 2023-07-19 DIAGNOSIS — R569 Unspecified convulsions: Secondary | ICD-10-CM

## 2023-07-19 DIAGNOSIS — Z825 Family history of asthma and other chronic lower respiratory diseases: Secondary | ICD-10-CM

## 2023-07-19 DIAGNOSIS — W501XXA Accidental kick by another person, initial encounter: Secondary | ICD-10-CM | POA: Diagnosis present

## 2023-07-19 DIAGNOSIS — E785 Hyperlipidemia, unspecified: Secondary | ICD-10-CM | POA: Diagnosis present

## 2023-07-19 DIAGNOSIS — R7989 Other specified abnormal findings of blood chemistry: Secondary | ICD-10-CM | POA: Diagnosis present

## 2023-07-19 DIAGNOSIS — R9431 Abnormal electrocardiogram [ECG] [EKG]: Secondary | ICD-10-CM | POA: Diagnosis present

## 2023-07-19 DIAGNOSIS — R1031 Right lower quadrant pain: Principal | ICD-10-CM | POA: Diagnosis present

## 2023-07-19 DIAGNOSIS — Z79899 Other long term (current) drug therapy: Secondary | ICD-10-CM

## 2023-07-19 DIAGNOSIS — E109 Type 1 diabetes mellitus without complications: Secondary | ICD-10-CM | POA: Diagnosis present

## 2023-07-19 DIAGNOSIS — F319 Bipolar disorder, unspecified: Secondary | ICD-10-CM | POA: Diagnosis present

## 2023-07-19 DIAGNOSIS — I10 Essential (primary) hypertension: Secondary | ICD-10-CM | POA: Diagnosis present

## 2023-07-19 DIAGNOSIS — E1065 Type 1 diabetes mellitus with hyperglycemia: Secondary | ICD-10-CM | POA: Diagnosis present

## 2023-07-19 DIAGNOSIS — I5022 Chronic systolic (congestive) heart failure: Secondary | ICD-10-CM | POA: Diagnosis present

## 2023-07-19 DIAGNOSIS — Z992 Dependence on renal dialysis: Secondary | ICD-10-CM

## 2023-07-19 DIAGNOSIS — R109 Unspecified abdominal pain: Secondary | ICD-10-CM

## 2023-07-19 DIAGNOSIS — I428 Other cardiomyopathies: Secondary | ICD-10-CM | POA: Diagnosis present

## 2023-07-19 DIAGNOSIS — E1159 Type 2 diabetes mellitus with other circulatory complications: Secondary | ICD-10-CM | POA: Diagnosis present

## 2023-07-19 DIAGNOSIS — Z794 Long term (current) use of insulin: Secondary | ICD-10-CM

## 2023-07-19 DIAGNOSIS — E1140 Type 2 diabetes mellitus with diabetic neuropathy, unspecified: Secondary | ICD-10-CM | POA: Diagnosis present

## 2023-07-19 DIAGNOSIS — I5043 Acute on chronic combined systolic (congestive) and diastolic (congestive) heart failure: Secondary | ICD-10-CM | POA: Diagnosis present

## 2023-07-19 DIAGNOSIS — R7401 Elevation of levels of liver transaminase levels: Secondary | ICD-10-CM | POA: Diagnosis present

## 2023-07-19 DIAGNOSIS — N186 End stage renal disease: Secondary | ICD-10-CM

## 2023-07-19 DIAGNOSIS — Z833 Family history of diabetes mellitus: Secondary | ICD-10-CM

## 2023-07-19 DIAGNOSIS — Z880 Allergy status to penicillin: Secondary | ICD-10-CM

## 2023-07-19 DIAGNOSIS — R1011 Right upper quadrant pain: Secondary | ICD-10-CM | POA: Diagnosis present

## 2023-07-19 DIAGNOSIS — I132 Hypertensive heart and chronic kidney disease with heart failure and with stage 5 chronic kidney disease, or end stage renal disease: Secondary | ICD-10-CM | POA: Diagnosis present

## 2023-07-19 LAB — COMPREHENSIVE METABOLIC PANEL
ALT: 156 U/L — ABNORMAL HIGH (ref 0–44)
AST: 253 U/L — ABNORMAL HIGH (ref 15–41)
Albumin: 3.5 g/dL (ref 3.5–5.0)
Alkaline Phosphatase: 418 U/L — ABNORMAL HIGH (ref 38–126)
Anion gap: 15 (ref 5–15)
BUN: 38 mg/dL — ABNORMAL HIGH (ref 6–20)
CO2: 22 mmol/L (ref 22–32)
Calcium: 8.6 mg/dL — ABNORMAL LOW (ref 8.9–10.3)
Chloride: 98 mmol/L (ref 98–111)
Creatinine, Ser: 8.52 mg/dL — ABNORMAL HIGH (ref 0.44–1.00)
GFR, Estimated: 6 mL/min — ABNORMAL LOW (ref >60)
Glucose, Bld: 489 mg/dL — ABNORMAL HIGH (ref 70–99)
Potassium: 4.7 mmol/L (ref 3.5–5.1)
Sodium: 135 mmol/L (ref 135–145)
Total Bilirubin: 1.2 mg/dL (ref 0.0–1.2)
Total Protein: 6.3 g/dL — ABNORMAL LOW (ref 6.5–8.1)

## 2023-07-19 LAB — CBC
HCT: 40.1 % (ref 36.0–46.0)
Hemoglobin: 12.1 g/dL (ref 12.0–15.0)
MCH: 26.5 pg (ref 26.0–34.0)
MCHC: 30.2 g/dL (ref 30.0–36.0)
MCV: 87.9 fL (ref 80.0–100.0)
Platelets: 256 10*3/uL (ref 150–400)
RBC: 4.56 MIL/uL (ref 3.87–5.11)
RDW: 18.2 % — ABNORMAL HIGH (ref 11.5–15.5)
WBC: 8.4 10*3/uL (ref 4.0–10.5)
nRBC: 0.2 % (ref 0.0–0.2)

## 2023-07-19 LAB — I-STAT CHEM 8, ED
BUN: 45 mg/dL — ABNORMAL HIGH (ref 6–20)
Calcium, Ion: 0.99 mmol/L — ABNORMAL LOW (ref 1.15–1.40)
Chloride: 103 mmol/L (ref 98–111)
Creatinine, Ser: 9 mg/dL — ABNORMAL HIGH (ref 0.44–1.00)
Glucose, Bld: 487 mg/dL — ABNORMAL HIGH (ref 70–99)
HCT: 41 % (ref 36.0–46.0)
Hemoglobin: 13.9 g/dL (ref 12.0–15.0)
Potassium: 4.7 mmol/L (ref 3.5–5.1)
Sodium: 135 mmol/L (ref 135–145)
TCO2: 24 mmol/L (ref 22–32)

## 2023-07-19 LAB — CBG MONITORING, ED: Glucose-Capillary: 439 mg/dL — ABNORMAL HIGH (ref 70–99)

## 2023-07-19 LAB — LIPASE, BLOOD: Lipase: 37 U/L (ref 11–51)

## 2023-07-19 LAB — HEPATITIS B SURFACE ANTIGEN: Hepatitis B Surface Ag: NONREACTIVE

## 2023-07-19 LAB — HCG, SERUM, QUALITATIVE: Preg, Serum: NEGATIVE

## 2023-07-19 MED ORDER — IOHEXOL 350 MG/ML SOLN
75.0000 mL | Freq: Once | INTRAVENOUS | Status: AC | PRN
  Administered 2023-07-19: 75 mL via INTRAVENOUS

## 2023-07-19 MED ORDER — FENTANYL CITRATE PF 50 MCG/ML IJ SOSY
50.0000 ug | PREFILLED_SYRINGE | Freq: Once | INTRAMUSCULAR | Status: AC
  Administered 2023-07-19: 50 ug via INTRAVENOUS
  Filled 2023-07-19: qty 1

## 2023-07-19 MED ORDER — CHLORHEXIDINE GLUCONATE CLOTH 2 % EX PADS
6.0000 | MEDICATED_PAD | Freq: Every day | CUTANEOUS | Status: DC
Start: 2023-07-20 — End: 2023-07-23
  Administered 2023-07-20 – 2023-07-22 (×2): 6 via TOPICAL
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
## 2023-07-19 NOTE — ED Notes (Signed)
 Patient did not receive dialysis yesterday but did receive it Tuesday and Wednesday back to back.
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
## 2023-07-19 NOTE — ED Notes (Signed)
 Dialysis consent signed at bedside

## 2023-07-20 ENCOUNTER — Encounter (HOSPITAL_COMMUNITY): Payer: Self-pay

## 2023-07-20 ENCOUNTER — Inpatient Hospital Stay (HOSPITAL_COMMUNITY)

## 2023-07-20 DIAGNOSIS — R1084 Generalized abdominal pain: Secondary | ICD-10-CM | POA: Diagnosis present

## 2023-07-20 DIAGNOSIS — Z885 Allergy status to narcotic agent status: Secondary | ICD-10-CM | POA: Diagnosis not present

## 2023-07-20 DIAGNOSIS — I5022 Chronic systolic (congestive) heart failure: Secondary | ICD-10-CM | POA: Diagnosis present

## 2023-07-20 DIAGNOSIS — N186 End stage renal disease: Secondary | ICD-10-CM

## 2023-07-20 DIAGNOSIS — Z825 Family history of asthma and other chronic lower respiratory diseases: Secondary | ICD-10-CM | POA: Diagnosis not present

## 2023-07-20 DIAGNOSIS — E1022 Type 1 diabetes mellitus with diabetic chronic kidney disease: Secondary | ICD-10-CM | POA: Diagnosis present

## 2023-07-20 DIAGNOSIS — D631 Anemia in chronic kidney disease: Secondary | ICD-10-CM | POA: Diagnosis present

## 2023-07-20 DIAGNOSIS — Z833 Family history of diabetes mellitus: Secondary | ICD-10-CM | POA: Diagnosis not present

## 2023-07-20 DIAGNOSIS — R188 Other ascites: Secondary | ICD-10-CM | POA: Diagnosis present

## 2023-07-20 DIAGNOSIS — I5020 Unspecified systolic (congestive) heart failure: Secondary | ICD-10-CM

## 2023-07-20 DIAGNOSIS — W501XXA Accidental kick by another person, initial encounter: Secondary | ICD-10-CM | POA: Diagnosis present

## 2023-07-20 DIAGNOSIS — E10649 Type 1 diabetes mellitus with hypoglycemia without coma: Secondary | ICD-10-CM | POA: Diagnosis present

## 2023-07-20 DIAGNOSIS — E785 Hyperlipidemia, unspecified: Secondary | ICD-10-CM | POA: Diagnosis present

## 2023-07-20 DIAGNOSIS — R109 Unspecified abdominal pain: Secondary | ICD-10-CM | POA: Diagnosis not present

## 2023-07-20 DIAGNOSIS — E1040 Type 1 diabetes mellitus with diabetic neuropathy, unspecified: Secondary | ICD-10-CM | POA: Diagnosis present

## 2023-07-20 DIAGNOSIS — Z1152 Encounter for screening for COVID-19: Secondary | ICD-10-CM | POA: Diagnosis not present

## 2023-07-20 DIAGNOSIS — Z992 Dependence on renal dialysis: Secondary | ICD-10-CM

## 2023-07-20 DIAGNOSIS — Z79899 Other long term (current) drug therapy: Secondary | ICD-10-CM | POA: Diagnosis not present

## 2023-07-20 DIAGNOSIS — G8918 Other acute postprocedural pain: Secondary | ICD-10-CM

## 2023-07-20 DIAGNOSIS — Z794 Long term (current) use of insulin: Secondary | ICD-10-CM | POA: Diagnosis not present

## 2023-07-20 DIAGNOSIS — I428 Other cardiomyopathies: Secondary | ICD-10-CM | POA: Diagnosis present

## 2023-07-20 DIAGNOSIS — R7989 Other specified abnormal findings of blood chemistry: Secondary | ICD-10-CM | POA: Diagnosis not present

## 2023-07-20 DIAGNOSIS — F319 Bipolar disorder, unspecified: Secondary | ICD-10-CM

## 2023-07-20 DIAGNOSIS — E1065 Type 1 diabetes mellitus with hyperglycemia: Secondary | ICD-10-CM | POA: Diagnosis present

## 2023-07-20 DIAGNOSIS — R9431 Abnormal electrocardiogram [ECG] [EKG]: Secondary | ICD-10-CM | POA: Diagnosis not present

## 2023-07-20 DIAGNOSIS — R1031 Right lower quadrant pain: Secondary | ICD-10-CM | POA: Diagnosis present

## 2023-07-20 DIAGNOSIS — R1011 Right upper quadrant pain: Secondary | ICD-10-CM | POA: Diagnosis present

## 2023-07-20 DIAGNOSIS — I132 Hypertensive heart and chronic kidney disease with heart failure and with stage 5 chronic kidney disease, or end stage renal disease: Secondary | ICD-10-CM | POA: Diagnosis present

## 2023-07-20 DIAGNOSIS — Z880 Allergy status to penicillin: Secondary | ICD-10-CM | POA: Diagnosis not present

## 2023-07-20 DIAGNOSIS — Z881 Allergy status to other antibiotic agents status: Secondary | ICD-10-CM | POA: Diagnosis not present

## 2023-07-20 LAB — BILIRUBIN, DIRECT: Bilirubin, Direct: 0.2 mg/dL (ref 0.0–0.2)

## 2023-07-20 LAB — CBC WITH DIFFERENTIAL/PLATELET
Abs Immature Granulocytes: 0.04 10*3/uL (ref 0.00–0.07)
Basophils Absolute: 0.1 10*3/uL (ref 0.0–0.1)
Basophils Relative: 1 %
Eosinophils Absolute: 0.5 10*3/uL (ref 0.0–0.5)
Eosinophils Relative: 5 %
HCT: 38.2 % (ref 36.0–46.0)
Hemoglobin: 11.6 g/dL — ABNORMAL LOW (ref 12.0–15.0)
Immature Granulocytes: 0 %
Lymphocytes Relative: 43 %
Lymphs Abs: 4.4 10*3/uL — ABNORMAL HIGH (ref 0.7–4.0)
MCH: 26.3 pg (ref 26.0–34.0)
MCHC: 30.4 g/dL (ref 30.0–36.0)
MCV: 86.6 fL (ref 80.0–100.0)
Monocytes Absolute: 0.7 10*3/uL (ref 0.1–1.0)
Monocytes Relative: 6 %
Neutro Abs: 4.7 10*3/uL (ref 1.7–7.7)
Neutrophils Relative %: 45 %
Platelets: 234 10*3/uL (ref 150–400)
RBC: 4.41 MIL/uL (ref 3.87–5.11)
RDW: 18.1 % — ABNORMAL HIGH (ref 11.5–15.5)
WBC: 10.3 10*3/uL (ref 4.0–10.5)
nRBC: 0.3 % — ABNORMAL HIGH (ref 0.0–0.2)

## 2023-07-20 LAB — COMPREHENSIVE METABOLIC PANEL
ALT: 170 U/L — ABNORMAL HIGH (ref 0–44)
AST: 298 U/L — ABNORMAL HIGH (ref 15–41)
Albumin: 3.1 g/dL — ABNORMAL LOW (ref 3.5–5.0)
Alkaline Phosphatase: 409 U/L — ABNORMAL HIGH (ref 38–126)
Anion gap: 8 (ref 5–15)
BUN: 38 mg/dL — ABNORMAL HIGH (ref 6–20)
CO2: 25 mmol/L (ref 22–32)
Calcium: 8 mg/dL — ABNORMAL LOW (ref 8.9–10.3)
Chloride: 105 mmol/L (ref 98–111)
Creatinine, Ser: 8.96 mg/dL — ABNORMAL HIGH (ref 0.44–1.00)
GFR, Estimated: 6 mL/min — ABNORMAL LOW (ref >60)
Glucose, Bld: 135 mg/dL — ABNORMAL HIGH (ref 70–99)
Potassium: 3.9 mmol/L (ref 3.5–5.1)
Sodium: 138 mmol/L (ref 135–145)
Total Bilirubin: 1 mg/dL (ref 0.0–1.2)
Total Protein: 5.6 g/dL — ABNORMAL LOW (ref 6.5–8.1)

## 2023-07-20 LAB — GLUCOSE, CAPILLARY
Glucose-Capillary: 222 mg/dL — ABNORMAL HIGH (ref 70–99)
Glucose-Capillary: 47 mg/dL — ABNORMAL LOW (ref 70–99)
Glucose-Capillary: 47 mg/dL — ABNORMAL LOW (ref 70–99)
Glucose-Capillary: 71 mg/dL (ref 70–99)
Glucose-Capillary: 143 mg/dL — ABNORMAL HIGH (ref 70–99)
Glucose-Capillary: 62 mg/dL — ABNORMAL LOW (ref 70–99)
Glucose-Capillary: 217 mg/dL — ABNORMAL HIGH (ref 70–99)
Glucose-Capillary: 123 mg/dL — ABNORMAL HIGH (ref 70–99)
Glucose-Capillary: 212 mg/dL — ABNORMAL HIGH (ref 70–99)
Glucose-Capillary: 229 mg/dL — ABNORMAL HIGH (ref 70–99)
Glucose-Capillary: 306 mg/dL — ABNORMAL HIGH (ref 70–99)

## 2023-07-20 LAB — CBG MONITORING, ED: Glucose-Capillary: 257 mg/dL — ABNORMAL HIGH (ref 70–99)

## 2023-07-20 LAB — PROTIME-INR
INR: 1.4 — ABNORMAL HIGH (ref 0.8–1.2)
Prothrombin Time: 17.5 s — ABNORMAL HIGH (ref 11.4–15.2)

## 2023-07-20 MED ORDER — INSULIN ASPART 100 UNIT/ML IJ SOLN: 0.0000 [IU] | Freq: Three times a day (TID) | INTRAMUSCULAR | Status: DC

## 2023-07-20 MED ORDER — CALCITRIOL 0.5 MCG PO CAPS
1.2500 ug | ORAL_CAPSULE | ORAL | Status: DC
  Administered 2023-07-20 – 2023-07-23 (×2): 1.25 ug via ORAL
  Filled 2023-07-20 (×2): qty 1

## 2023-07-20 MED ORDER — DEXTROSE 50 % IV SOLN
1.0000 | INTRAVENOUS | Status: DC | PRN
  Administered 2023-07-20 – 2023-07-21 (×2): 50 mL via INTRAVENOUS
  Filled 2023-07-20 (×2): qty 50

## 2023-07-20 MED ORDER — FLUOXETINE HCL 20 MG PO CAPS
20.0000 mg | ORAL_CAPSULE | Freq: Every day | ORAL | Status: DC
  Administered 2023-07-20 – 2023-07-23 (×4): 20 mg via ORAL
  Filled 2023-07-20 (×4): qty 1

## 2023-07-20 MED ORDER — OXYCODONE HCL 5 MG PO TABS
5.0000 mg | ORAL_TABLET | Freq: Once | ORAL | Status: DC
  Filled 2023-07-20: qty 1

## 2023-07-20 MED ORDER — HYDROCODONE-ACETAMINOPHEN 5-325 MG PO TABS
1.0000 | ORAL_TABLET | Freq: Four times a day (QID) | ORAL | Status: DC | PRN
  Administered 2023-07-20 – 2023-07-23 (×6): 2 via ORAL
  Filled 2023-07-20 (×8): qty 2

## 2023-07-20 MED ORDER — DEXTROSE 50 % IV SOLN
25.0000 g | INTRAVENOUS | Status: AC
  Administered 2023-07-20: 25 g via INTRAVENOUS

## 2023-07-20 MED ORDER — INSULIN ASPART 100 UNIT/ML IJ SOLN
0.0000 [IU] | INTRAMUSCULAR | Status: DC
  Administered 2023-07-20: 5 [IU] via SUBCUTANEOUS
  Administered 2023-07-20: 8 [IU] via SUBCUTANEOUS

## 2023-07-20 MED ORDER — DEXTROSE 50 % IV SOLN
12.5000 g | INTRAVENOUS | Status: AC
  Administered 2023-07-20: 12.5 g via INTRAVENOUS
  Filled 2023-07-20: qty 50

## 2023-07-20 MED ORDER — FENTANYL CITRATE PF 50 MCG/ML IJ SOSY
50.0000 ug | PREFILLED_SYRINGE | Freq: Once | INTRAMUSCULAR | Status: AC | PRN
  Administered 2023-07-20: 50 ug via INTRAVENOUS
  Filled 2023-07-20: qty 1

## 2023-07-20 MED ORDER — PROCHLORPERAZINE EDISYLATE 10 MG/2ML IJ SOLN
5.0000 mg | INTRAMUSCULAR | Status: DC | PRN
  Administered 2023-07-21: 5 mg via INTRAVENOUS
  Filled 2023-07-20: qty 2

## 2023-07-20 MED ORDER — SENNOSIDES-DOCUSATE SODIUM 8.6-50 MG PO TABS: 1.0000 | ORAL_TABLET | Freq: Every evening | ORAL | Status: DC | PRN

## 2023-07-20 MED ORDER — ROSUVASTATIN CALCIUM 20 MG PO TABS
40.0000 mg | ORAL_TABLET | Freq: Every day | ORAL | Status: DC
  Administered 2023-07-20 – 2023-07-23 (×4): 40 mg via ORAL
  Filled 2023-07-20 (×5): qty 2

## 2023-07-20 MED ORDER — SODIUM CHLORIDE 0.9 % IV BOLUS
500.0000 mL | Freq: Once | INTRAVENOUS | Status: AC
  Administered 2023-07-20: 500 mL via INTRAVENOUS

## 2023-07-20 MED ORDER — INSULIN ASPART 100 UNIT/ML IJ SOLN: 0.0000 [IU] | INTRAMUSCULAR | Status: DC

## 2023-07-20 MED ORDER — DEXTROSE 10 % IV SOLN: INTRAVENOUS | Status: DC

## 2023-07-20 MED ORDER — HEPARIN SODIUM (PORCINE) 5000 UNIT/ML IJ SOLN
5000.0000 [IU] | Freq: Three times a day (TID) | INTRAMUSCULAR | Status: DC
  Administered 2023-07-20 – 2023-07-21 (×3): 5000 [IU] via SUBCUTANEOUS
  Filled 2023-07-20 (×8): qty 1

## 2023-07-20 MED ORDER — ONDANSETRON HCL 4 MG PO TABS: 4.0000 mg | ORAL_TABLET | Freq: Four times a day (QID) | ORAL | Status: DC | PRN

## 2023-07-20 MED ORDER — CARVEDILOL 25 MG PO TABS
25.0000 mg | ORAL_TABLET | Freq: Two times a day (BID) | ORAL | Status: DC
  Administered 2023-07-20 – 2023-07-23 (×8): 25 mg via ORAL
  Filled 2023-07-20 (×8): qty 1

## 2023-07-20 MED ORDER — SEVELAMER CARBONATE 800 MG PO TABS
2400.0000 mg | ORAL_TABLET | Freq: Three times a day (TID) | ORAL | Status: DC
  Administered 2023-07-20 – 2023-07-23 (×11): 2400 mg via ORAL
  Filled 2023-07-20 (×12): qty 3

## 2023-07-20 MED ORDER — LOSARTAN POTASSIUM 50 MG PO TABS
100.0000 mg | ORAL_TABLET | Freq: Every day | ORAL | Status: DC
  Administered 2023-07-20 – 2023-07-23 (×4): 100 mg via ORAL
  Filled 2023-07-20 (×4): qty 2

## 2023-07-20 MED ORDER — INSULIN ASPART 100 UNIT/ML IJ SOLN: 0.0000 [IU] | Freq: Every day | INTRAMUSCULAR | Status: DC

## 2023-07-20 MED ORDER — ONDANSETRON HCL 4 MG/2ML IJ SOLN: 4.0000 mg | Freq: Four times a day (QID) | INTRAMUSCULAR | Status: DC | PRN

## 2023-07-20 MED ORDER — DEXTROSE 50 % IV SOLN
INTRAVENOUS | Status: AC
  Filled 2023-07-20: qty 50
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
## 2023-07-20 NOTE — Progress Notes (Signed)
 [facility] night cross cover note:   I added a one time dose of Fentanyl 50 mcg iv x 1 dose prn for pain in the setting of report of 10/10 abd pain.     Newton Pigg, DO Hospitalist

## 2023-07-20 NOTE — Progress Notes (Signed)
 New Admission Note:    Arrival Method: ED stretcher Mental Orientation: a/ox4 Telemetry: n/a Assessment:completed Skin: intact IV: left arm Pain:10/10 abdomen Tubes: n/a Safety Measures: non skid socks bed alarm Admission: completed Orientation: Patient has been oriented to the room, unit and staff.  Family: n/a Belongings: at bedside  Orders have been reviewed and implemented. Will continue to monitor the patient. Call light has been placed within reach and bed alarm has been activated.   Fabian Sharp BSN, RN-BC Phone number: [phone]

## 2023-07-20 NOTE — Progress Notes (Signed)
 [facility] night cross cover note:   I was notified by RN of the patient's refractory hypoglycemia this morning, with CBG results in the 40s following 2 A of D50  in this patient with end-stage renal disease on hemodialysis. She had previously received a total of 13 units of subcu NovoLog this evening.  Does not appear to have received any basal insulin.  I subsequently added D10 running at 100 cc/h, and changed current CBG monitoring from every 4 hours to every 2 hours, and discontinued additional sliding scale insulin for now.     Newton Pigg, DO Hospitalist

## 2023-07-20 NOTE — H&P (Addendum)
 PCP:   [facility]   Chief Complaint:  Abdominal pain, nausea vomiting  HPI: This is a 33 year old female with past medical history significant for T1DM, ESRD on hemodialysis [T/Th/sat], bipolar disorder, prolonged QT, HFrEF, cardiomyopathy.  On 3/10 she underwent a cholecystectomy without complication.  Per patient since discharge she has continued to have nausea, vomiting, abdominal pain.  She reports some fever.  Denies diarrhea.  Yesterday her discomfort was greater.  She came to the ER.  In the ER vitals stable.  Glucose 485, alk phos 418 [283 at discharge on 3/18], AST 253 [145 at discharge], ALT 156 [64 at discharge], T. bili normal. CT abdomen pelvis: Shows a postop small fluid collections.  Small amount of upper abdominal ascites and moderate pelvic ascites similar to prior study.   Surgeon on-call contacted.  The current thinking elevated LFTs may be secondary to congestive liver given her fluid overload.  Official consult in AM.  Review of Systems:  Per HPI  Past Medical History: Past Medical History:  Diagnosis Date   Abscess, gluteal, right 08/24/2013   Anemia 02/19/2012   Bartholin's gland abscess 09/19/2013   Bipolar disorder (HCC)    BV (bacterial vaginosis) 11/24/2015   Depression    Diabetes mellitus type I (HCC) 2001   Diagnosed at age 60 ; Type I   Diarrhea 05/30/2016   DKA (diabetic ketoacidoses) 08/19/2013   Also in 2018   ESRD (end stage renal disease) (HCC)    Gonorrhea 08/2011   Treated in 09/2011   HFrEF (heart failure with reduced ejection fraction) (HCC)    a. 2022 Echo: EF 40%; b. 10/2021 Echo: EF 55%; b. 07/2022 MV: No ischemia. EF 31%; c. 08/2022 Echo: EF 35%, mildly dil RV, sev TR.   History of trichomoniasis 05/31/2016   Hyperlipidemia 03/28/2016   Hypertension    NICM (nonischemic cardiomyopathy) (HCC)    Sepsis (HCC) 09/19/2013   Past Surgical History:  Procedure Laterality Date   A/V FISTULAGRAM Right 06/17/2023   Procedure: A/V  Fistulagram;  Surgeon: Annice Needy, MD;  Location: [facility] IN[facility]IVE [facility];  Service: Cardiovascular;  Laterality: Right;   AV FISTULA PLACEMENT Right 07/06/2022   Procedure: ARTERIOVENOUS GRAFT CREATION;  Surgeon: Cephus Shelling, MD;  Location: [facility] OR;  Service: Vascular;  Laterality: Right;   CESAREAN SECTION N/A 10/05/2019   Procedure: CESAREAN SECTION;  Surgeon: [place] Bing, MD;  Location: [facility] ORS;  Service: Obstetrics;  Laterality: N/A;   CHOLECYSTECTOMY N/A 07/02/2023   Procedure: LAPAROSCOPIC CHOLECYSTECTOMY;  Surgeon: Emelia Loron, MD;  Location: [facility] OR;  Service: General;  Laterality: N/A;   INCISION AND DRAINAGE ABSCESS Left 09/28/2019   Procedure: INCISION AND DRAINAGE VULVAR ABCESS;  Surgeon: Tilda Burrow, MD;  Location: [facility] OR;  Service: Gynecology;  Laterality: Left;   INCISION AND DRAINAGE PERIRECTAL ABSCESS Right 08/18/2013   Procedure: IRRIGATION AND DEBRIDEMENT GLUTEAL ABSCESS;  Surgeon: Axel Filler, MD;  Location: [facility] OR;  Service: General;  Laterality: Right;   INCISION AND DRAINAGE PERIRECTAL ABSCESS Right 09/19/2013   Procedure: IRRIGATION AND DEBRIDEMENT RIGHT GLUTEAL AND LABIAL ABSCESSES;  Surgeon: Axel Filler, MD;  Location: [facility] OR;  Service: General;  Laterality: Right;   INCISION AND DRAINAGE PERIRECTAL ABSCESS Right 09/24/2013   Procedure: IRRIGATION AND DEBRIDEMENT PERIRECTAL ABSCESS;  Surgeon: Cherylynn Ridges, MD;  Location: [facility] OR;  Service: General;  Laterality: Right;    Medications: Prior to Admission medications   Medication Sig Start Date End Date Taking? Authorizing Provider  acetaminophen (  TYLENOL) 500 MG tablet Take 1,000 mg by mouth as needed for mild pain (pain score 1-3) or moderate pain (pain score 4-6).    [provider]  albuterol (VENTOLIN HFA) 108 (90 Base) MCG/ACT inhaler Inhale 2 puffs into the lungs every 4 (four) hours as needed. 11/24/22   [provider]  calcitRIOL (ROCALTROL) 0.25 MCG capsule Take 5 capsules  (1.25 mcg total) by mouth every Tuesday, Thursday, and Saturday at 6 PM. 07/09/23   Jerald Kief, MD  carvedilol (COREG) 25 MG tablet Take 25 mg by mouth 2 (two) times daily with a meal. 05/15/23   [provider]  Continuous Glucose Sensor (DEXCOM G7 SENSOR) MISC Change sensors every 10 days 09/07/22   Shamleffer, Konrad Dolores, MD  FLUoxetine (PROZAC) 20 MG capsule Take 1 capsule by mouth daily. 03/23/23   [provider]  HYDROcodone-acetaminophen (NORCO/VICODIN) 5-325 MG tablet Take 1 tablet by mouth every 6 (six) hours as needed for moderate pain (pain score 4-6) or severe pain (pain score 7-10). 07/16/23   Durwin Glaze, MD  hydrOXYzine (ATARAX) 25 MG tablet Take 25 mg by mouth in the morning and at bedtime. 05/14/23   [provider]  Insulin Aspart FlexPen (NOVOLOG) 100 UNIT/ML Inject 4 Units into the skin 3 (three) times daily. 05/10/23 07/01/23  [provider]  LANTUS SOLOSTAR 100 UNIT/ML Solostar Pen SMARTSIG:8 Unit(s) SUB-Q Daily    [provider]  losartan (COZAAR) 100 MG tablet Take 1 tablet by mouth daily. 03/25/23   [provider]  ondansetron (ZOFRAN-ODT) 4 MG disintegrating tablet Take 1 tablet (4 mg total) by mouth every 8 (eight) hours as needed for nausea or vomiting. 06/23/23   Laurence Spates, MD  polyethylene glycol (MIRALAX / GLYCOLAX) 17 g packet Take 17 g by mouth daily as needed. 12/07/22   [provider]  rosuvastatin (CRESTOR) 40 MG tablet Take 40 mg by mouth daily. 05/22/22   [provider]  sevelamer carbonate (RENVELA) 800 MG tablet Take 2,400 mg by mouth 3 (three) times daily with meals.    [provider]  SUMAtriptan (IMITREX) 50 MG tablet Take 50 mg by mouth every 2 (two) hours as needed for migraine or headache. 06/20/22   [provider]    Allergies:   Allergies  Allergen Reactions   Cephalexin Anaphylaxis    Has gotten ceftriaxone in the past    Morphine Itching    Penicillins Hives and Rash   Dilaudid [Hydromorphone] Itching   Benadryl [Diphenhydramine] Itching   Doxycycline Itching   Methotrexate Derivatives Rash    Social History:  reports that she has never smoked. She has never been exposed to tobacco smoke. She has never used smokeless tobacco. She reports that she does not currently use alcohol. She reports that she does not use drugs.  Family History: Family History  Problem Relation Age of Onset   Asthma Mother    Carpal tunnel syndrome Mother    Gout Father    Diabetes Paternal Grandmother    Anesthesia problems Neg Hx     Physical Exam: Vitals:   07/19/23 1930 07/19/23 2047 07/19/23 2133 07/19/23 2245  BP: (!) 152/98 (!) 150/93  (!) 148/97  Pulse: 81 81  83  Resp:  18  15  Temp:   98.3 F (36.8 C)   TempSrc:   Oral   SpO2: 91% 98%  96%  Weight:      Height:  General: A and O x 3, well-developed female, no acute distress Eyes: Pink conjunctiva, no scleral icterus ENT: Dry lips Lungs: CTA B/L,, no use of accessory muscles Cardiovascular: RRR, no murmurs. No carotid bruits, no JVD Abdomen: soft, positive BS, mild TTP, not an acute abdomen GU: not examined Neuro: CN II - XII grossly intact Musculoskeletal: Moves all extremities.  Decreased musculature Skin: no rash, no subcutaneous crepitation Psych: appropriate patient  Labs on Admission:  Recent Labs    07/19/23 1822 07/19/23 1828  NA 135 135  K 4.7 4.7  CL 98 103  CO2 22  --   GLUCOSE 489* 487*  BUN 38* 45*  CREATININE 8.52* 9.00*  CALCIUM 8.6*  --    Recent Labs    07/19/23 1822  AST 253*  ALT 156*  ALKPHOS 418*  BILITOT 1.2  PROT 6.3*  ALBUMIN 3.5   Recent Labs    07/19/23 1822  LIPASE 37   Recent Labs    07/19/23 1822 07/19/23 1828  WBC 8.4  --   HGB 12.1 13.9  HCT 40.1 41.0  MCV 87.9  --   PLT 256  --     Micro Results: Recent Results (from the past 240 hours)  Resp panel by RT-PCR (RSV, Flu A&B, Covid) Anterior Nasal  Swab     Status: None   Collection Time: 07/11/23 11:32 AM   Specimen: Anterior Nasal Swab  Result Value Ref Range Status   SARS Coronavirus 2 by RT PCR NEGATIVE NEGATIVE Final   Influenza A by PCR NEGATIVE NEGATIVE Final   Influenza B by PCR NEGATIVE NEGATIVE Final    Comment: (NOTE) The Xpert Xpress SARS-CoV-2/FLU/RSV plus assay is intended as an aid in the diagnosis of influenza from Nasopharyngeal swab specimens and should not be used as a sole basis for treatment. Nasal washings and aspirates are unacceptable for Xpert Xpress SARS-CoV-2/FLU/RSV testing.  Fact Sheet for Patients: BloggerCourse.com  Fact Sheet for Healthcare Providers: SeriousBroker.it  This test is not yet approved or cleared by the [place] FDA and has been authorized for detection and/or diagnosis of SARS-CoV-2 by FDA under an Emergency Use Authorization (EUA). This EUA will remain in effect (meaning this test can be used) for the duration of the COVID-19 declaration under Section 564(b)(1) of the Act, 21 U.S.C. section 360bbb-3(b)(1), unless the authorization is terminated or revoked.     Resp Syncytial Virus by PCR NEGATIVE NEGATIVE Final    Comment: (NOTE) Fact Sheet for Patients: BloggerCourse.com  Fact Sheet for Healthcare Providers: SeriousBroker.it  This test is not yet approved or cleared by the [place] FDA and has been authorized for detection and/or diagnosis of SARS-CoV-2 by FDA under an Emergency Use Authorization (EUA). This EUA will remain in effect (meaning this test can be used) for the duration of the COVID-19 declaration under Section 564(b)(1) of the Act, 21 U.S.C. section 360bbb-3(b)(1), unless the authorization is terminated or revoked.  Performed at [facility], [address] [place], [place] [address]      Radiological Exams on Admission: CT ABDOMEN  PELVIS W CONTRAST Result Date: 07/19/2023 CLINICAL DATA:  Abdominal pain following cholecystectomy EXAM: CT ABDOMEN AND PELVIS WITH CONTRAST TECHNIQUE: Multidetector CT imaging of the abdomen and pelvis was performed using the standard protocol following bolus administration of intravenous contrast. RADIATION DOSE REDUCTION: This exam was performed according to the departmental dose-optimization program which includes automated exposure control, adjustment of the mA and/or kV according to patient size and/or use  of iterative reconstruction technique. CONTRAST:  75mL OMNIPAQUE IOHEXOL 350 MG/ML SOLN COMPARISON:  07/16/2023 FINDINGS: Lower chest: No acute abnormality. Hepatobiliary: Gallbladder has been surgically removed. The liver is somewhat heterogeneous in enhancement. Delayed images demonstrate some mild periportal edema which may be related to volume overload given the patient's history of missed dialysis sessions. Minimal fluid is noted within the gallbladder fossa although no discrete abscess is identified. The overall appearance is similar to that seen on prior exams. Pancreas: Unremarkable. No pancreatic ductal dilatation or surrounding inflammatory changes. Spleen: Normal in size without focal abnormality. Adrenals/Urinary Tract: Adrenal glands are within normal limits. Kidneys demonstrate a normal enhancement pattern. No renal calculi or obstructive changes are noted. The bladder is decompressed. Stomach/Bowel: No obstructive or inflammatory changes of the colon are seen. The appendix is within normal limits. Small bowel and stomach are within normal limits. No true obstructive changes are seen. Vascular/Lymphatic: Aortic atherosclerosis. No enlarged abdominal or pelvic lymph nodes. Reproductive: Uterus and bilateral adnexa are unremarkable. Other: Free fluid is again noted within the pelvis as well as surrounding the liver and spleen overall similar to that seen on prior exam. Musculoskeletal: No acute  or significant osseous findings. IMPRESSION: Stable postoperative fluid collection within the gallbladder fossa. Stable ascites as described. Mild periportal edema which may be related to volume overload. Electronically Signed   By: Alcide Clever M.D.   On: 07/19/2023 21:26    Assessment/Plan Present on Admission:  Elevated LFTs -Status postcholecystectomy -MRCP ordered -Surgery consult placed, official consult in a.m. -Pain meds as indicated -CMP in a.m. -Clear liquid diet ordered.  Fluid restriction   ESRD -Nephrologist Dr. Juel Burrow aware -Calcitriol, Renvela resumed   Bipolar disorder (HCC) -Prozac resumed   Type 1 diabetes mellitus (HCC) //  Diabetic neuropathy (HCC) -Sliding scale insulin   Dyslipidemia -Crestor resumed   Essential hypertension -Coreg, losartan resumed   HFrEF (heart failure with reduced ejection fraction) (HCC)  Prolonged QT interval   Ashley Freeman 07/20/2023, 12:56 AM

## 2023-07-20 NOTE — Consult Note (Signed)
 Reason for Consult: ESRD Referring Physician:  Dr. Joneen Roach  Chief Complaint: Abdominal pain  Dialysis Orders [facility].  TTS 3h 350/500 2K/2.5Ca  EDW 57.5 kg AVG 16g  -Tums EX 750 q HD -Cinacalcet 60 mg three times per week -Calcitriol 1.25 mcg three times per week -Heparin 1600 units bolus then 600 units/hr   Assessment/Plan: 1. Abdominal pain with s/p lap chole with LOA. Surgery is advancing diet do not think there is any postoperative complication.  CT imaging stable. 2. ESRD -  HD TTS. Continue on schedule - next HD today 3.T1DM - per primary  5. HTN/volume-  BP ok. Not to EDW, UF as able.  6. Anemia - Hgb 11.6 - monitor without ESA for now. 7. MBD - Corr Ca ok. Continue binder, VDRA, sensipar. 8. Nutrition - Alb low, continue protein supps.   HPI: Ashley Freeman is an 33 y.o. female with a history of diabetes,, bipolar disorder, prolonged QT, HFrEF cardiomyopathy, ESRD on dialysis Tuesday Thursdays and Saturdays at [facility] with last treatment on Wednesday.  She had treatment on Tuesday and Wednesday of this past week.  Recently underwent a cholecystectomy without complication but has since had continued nausea vomiting and abdominal pain.  She states that she has had some fevers but denies any diarrhea.  She came to the emergency for evaluation.  CT scan with and without contrast showed a postoperative fluid collection within the gallbladder fossa that was stable, stable ascites, mild periorbital edema.  She has already been seen by surgery who do not think this is anything related to postoperative complications and do not recommend further imaging.  ROS Pertinent items are noted in HPI.  Chemistry and CBC: Creatinine  Date/Time Value Ref Range Status  11/12/2013 04:23 AM 0.59 (L) 0.60 - 1.30 mg/dL Final  16/01/9603 54:09 AM 0.70 0.60 - 1.30 mg/dL Final  81/19/1478 29:56 PM 0.71 0.60 - 1.30 mg/dL Final  21/30/8657 84:69 AM 0.69 0.60 - 1.30  mg/dL Final  62/95/2841 32:44 AM 0.84 0.60 - 1.30 mg/dL Final  05/02/7251 66:44 AM 0.83 0.60 - 1.30 mg/dL Final  03/47/4259 56:38 AM 0.99 0.60 - 1.30 mg/dL Final  75/64/3329 51:88 PM 1.01 0.60 - 1.30 mg/dL Final  41/66/0630 16:01 PM 1.44 (H) 0.60 - 1.30 mg/dL Final   Creat  Date/Time Value Ref Range Status  08/24/2013 09:57 AM 0.51 0.50 - 1.10 mg/dL Final  09/32/3557 32:20 PM 0.59 0.50 - 1.10 mg/dL Final   Creatinine, Ser  Date/Time Value Ref Range Status  07/20/2023 03:47 AM 8.96 (H) 0.44 - 1.00 mg/dL Final  25/42/7062 37:62 PM 9.00 (H) 0.44 - 1.00 mg/dL Final  83/15/1761 60:73 PM 8.52 (H) 0.44 - 1.00 mg/dL Final  71/09/2692 85:46 PM 7.54 (H) 0.44 - 1.00 mg/dL Final  27/06/5007 38:18 AM 6.55 (H) 0.44 - 1.00 mg/dL Final  29/93/7169 67:89 AM 8.77 (H) 0.44 - 1.00 mg/dL Final  38/01/1750 02:58 AM 6.02 (H) 0.44 - 1.00 mg/dL Final  52/77/8242 35:36 AM 5.06 (H) 0.44 - 1.00 mg/dL Final  14/43/1540 08:67 AM 7.36 (H) 0.44 - 1.00 mg/dL Final  61/95/0932 67:12 AM 6.29 (H) 0.44 - 1.00 mg/dL Final  45/80/9983 38:25 AM 7.92 (H) 0.44 - 1.00 mg/dL Final  05/39/7673 41:93 AM 6.98 (H) 0.44 - 1.00 mg/dL Final  79/06/4095 35:32 PM 6.12 (H) 0.44 - 1.00 mg/dL Final  99/24/2683 41:96 PM 5.80 (H) 0.44 - 1.00 mg/dL Final  22/29/7989 21:19 AM 8.73 (H) 0.44 - 1.00 mg/dL Final  41/74/0814 48:18  PM 8.40 (H) 0.44 - 1.00 mg/dL Final  40/98/1191 47:82 PM 8.35 (H) 0.44 - 1.00 mg/dL Final  95/62/1308 65:78 PM 8.16 (H) 0.44 - 1.00 mg/dL Final  46/96/2952 84:13 PM 7.89 (H) 0.44 - 1.00 mg/dL Final  24/40/1027 25:36 AM 8.10 (H) 0.44 - 1.00 mg/dL Final  64/40/3474 25:95 PM 6.42 (H) 0.44 - 1.00 mg/dL Final  63/87/5643 32:95 AM 8.93 (H) 0.44 - 1.00 mg/dL Final  18/84/1660 63:01 PM 8.72 (H) 0.44 - 1.00 mg/dL Final  60/01/9322 55:73 PM 8.56 (H) 0.44 - 1.00 mg/dL Final  22/05/5425 06:23 AM 8.52 (H) 0.44 - 1.00 mg/dL Final  76/28/3151 76:16 AM 8.29 (H) 0.44 - 1.00 mg/dL Final  07/37/1062 69:48 AM 8.29 (H) 0.44 - 1.00  mg/dL Final  54/62/7035 00:93 PM 6.78 (H) 0.44 - 1.00 mg/dL Final  81/82/9937 16:96 PM 6.40 (H) 0.44 - 1.00 mg/dL Final  78/93/8101 75:10 PM 6.08 (H) 0.44 - 1.00 mg/dL Final  25/85/2778 24:23 PM 7.78 (H) 0.44 - 1.00 mg/dL Final  53/61/4431 54:00 PM 7.48 (H) 0.44 - 1.00 mg/dL Final  86/76/1950 93:26 AM 6.99 (H) 0.44 - 1.00 mg/dL Final  71/24/5809 98:33 PM 6.17 (H) 0.44 - 1.00 mg/dL Final  82/50/5397 67:34 AM 5.30 (H) 0.44 - 1.00 mg/dL Final  19/37/9024 09:73 AM 3.80 (H) 0.44 - 1.00 mg/dL Final  53/29/9242 68:34 AM 3.90 (H) 0.44 - 1.00 mg/dL Final  19/62/2297 98:92 AM 4.13 (H) 0.44 - 1.00 mg/dL Final  11/94/1740 81:44 AM 4.61 (H) 0.44 - 1.00 mg/dL Final  81/85/6314 97:02 AM 4.92 (H) 0.44 - 1.00 mg/dL Final  63/78/5885 02:77 AM 4.95 (H) 0.44 - 1.00 mg/dL Final  41/28/7867 67:20 PM 4.84 (H) 0.44 - 1.00 mg/dL Final  94/70/9628 36:62 PM 5.24 (H) 0.44 - 1.00 mg/dL Final  94/76/5465 03:54 PM 5.33 (H) 0.44 - 1.00 mg/dL Final  65/68/1275 17:00 AM 5.54 (H) 0.44 - 1.00 mg/dL Final  17/49/4496 75:91 AM 2.89 (H) 0.44 - 1.00 mg/dL Final  63/84/6659 93:57 AM 3.13 (H) 0.44 - 1.00 mg/dL Final  01/77/9390 30:09 AM 3.17 (H) 0.44 - 1.00 mg/dL Final  23/30/0762 26:33 PM 3.19 (H) 0.44 - 1.00 mg/dL Final  35/45/6256 38:93 PM 3.55 (H) 0.44 - 1.00 mg/dL Final  73/42/8768 11:57 PM 3.64 (H) 0.44 - 1.00 mg/dL Final  26/20/3559 74:16 PM 2.69 (H) 0.57 - 1.00 mg/dL Final   Recent Labs  Lab 07/16/23 0008 07/16/23 2055 07/19/23 1822 07/19/23 1828 07/20/23 0347  NA 137 138 135 135 138  K 3.6 3.9 4.7 4.7 3.9  CL 100 100 98 103 105  CO2 23 22 22   --  25  GLUCOSE 169* 145* 489* 487* 135*  BUN 26* 29* 38* 45* 38*  CREATININE 6.55* 7.54* 8.52* 9.00* 8.96*  CALCIUM 7.7* 8.6* 8.6*  --  8.0*   Recent Labs  Lab 07/16/23 0008 07/16/23 2055 07/19/23 1822 07/19/23 1828 07/20/23 0347  WBC 8.2 9.0 8.4  --  10.3  NEUTROABS 3.5 4.0  --   --  4.7  HGB 12.1 12.1 12.1 13.9 11.6*  HCT 39.4 39.6 40.1 41.0 38.2  MCV  85.3 86.1 87.9  --  86.6  PLT 316 312 256  --  234   Liver Function Tests: Recent Labs  Lab 07/16/23 2055 07/19/23 1822 07/20/23 0347  AST 145* 253* 298*  ALT 64* 156* 170*  ALKPHOS 283* 418* 409*  BILITOT 0.7 1.2 1.0  PROT 6.5 6.3* 5.6*  ALBUMIN 3.4* 3.5 3.1*   Recent Labs  Lab 07/16/23 2055 07/19/23 1822  LIPASE 22 37   No results for input(s): "AMMONIA" in the last 168 hours. Cardiac Enzymes: No results for input(s): "CKTOTAL", "CKMB", "CKMBINDEX", "TROPONINI" in the last 168 hours. Iron Studies: No results for input(s): "IRON", "TIBC", "TRANSFERRIN", "FERRITIN" in the last 72 hours. PT/INR: @LABRCNTIP (inr:5)  Xrays/Other Studies: ) Results for orders placed or performed during the hospital encounter of 07/19/23 (from the past 48 hours)  POC CBG, ED     Status: Abnormal   Collection Time: 07/19/23  6:21 PM  Result Value Ref Range   Glucose-Capillary 439 (H) 70 - 99 mg/dL    Comment: Glucose reference range applies only to samples taken after fasting for at least 8 hours.  Lipase, blood     Status: None   Collection Time: 07/19/23  6:22 PM  Result Value Ref Range   Lipase 37 11 - 51 U/L    Comment: Performed at [facility], [address] [place], [place] [address]  Comprehensive metabolic panel     Status: Abnormal   Collection Time: 07/19/23  6:22 PM  Result Value Ref Range   Sodium 135 135 - 145 mmol/L   Potassium 4.7 3.5 - 5.1 mmol/L   Chloride 98 98 - 111 mmol/L   CO2 22 22 - 32 mmol/L   Glucose, Bld 489 (H) 70 - 99 mg/dL    Comment: Glucose reference range applies only to samples taken after fasting for at least 8 hours.   BUN 38 (H) 6 - 20 mg/dL   Creatinine, Ser 6.04 (H) 0.44 - 1.00 mg/dL   Calcium 8.6 (L) 8.9 - 10.3 mg/dL   Total Protein 6.3 (L) 6.5 - 8.1 g/dL   Albumin 3.5 3.5 - 5.0 g/dL   AST 540 (H) 15 - 41 U/L   ALT 156 (H) 0 - 44 U/L   Alkaline Phosphatase 418 (H) 38 - 126 U/L   Total Bilirubin 1.2 0.0 - 1.2 mg/dL   GFR, Estimated 6  (L) >60 mL/min    Comment: (NOTE) Calculated using the CKD-EPI Creatinine Equation (2021)    Anion gap 15 5 - 15    Comment: Performed at [facility], [address], [place], [place] [address]  CBC     Status: Abnormal   Collection Time: 07/19/23  6:22 PM  Result Value Ref Range   WBC 8.4 4.0 - 10.5 K/uL   RBC 4.56 3.87 - 5.11 MIL/uL   Hemoglobin 12.1 12.0 - 15.0 g/dL   HCT 14.7 82.9 - 56.2 %   MCV 87.9 80.0 - 100.0 fL   MCH 26.5 26.0 - 34.0 pg   MCHC 30.2 30.0 - 36.0 g/dL   RDW 13.0 (H) 86.5 - 78.4 %   Platelets 256 150 - 400 K/uL   nRBC 0.2 0.0 - 0.2 %    Comment: Performed at [facility], [address] [place], [place] [address]  hCG, serum, qualitative     Status: None   Collection Time: 07/19/23  6:22 PM  Result Value Ref Range   Preg, Serum NEGATIVE NEGATIVE    Comment:        THE SENSITIVITY OF THIS METHODOLOGY IS >10 mIU/mL. Performed at [facility], [address] [place], [place] [address]   I-stat chem 8, ED (not at [facility], [facility] or [facility])     Status: Abnormal   Collection Time: 07/19/23  6:28 PM  Result Value Ref Range   Sodium  135 135 - 145 mmol/L   Potassium 4.7 3.5 - 5.1 mmol/L   Chloride 103 98 - 111 mmol/L   BUN 45 (H) 6 - 20 mg/dL   Creatinine, Ser 1.61 (H) 0.44 - 1.00 mg/dL   Glucose, Bld 096 (H) 70 - 99 mg/dL    Comment: Glucose reference range applies only to samples taken after fasting for at least 8 hours.   Calcium, Ion 0.99 (L) 1.15 - 1.40 mmol/L   TCO2 24 22 - 32 mmol/L   Hemoglobin 13.9 12.0 - 15.0 g/dL   HCT 04.5 40.9 - 81.1 %  Hepatitis B surface antigen     Status: None   Collection Time: 07/19/23  8:56 PM  Result Value Ref Range   Hepatitis B Surface Ag NON REACTIVE NON REACTIVE    Comment: Performed at [facility], [address], [place], [place] [address]  CBG monitoring, ED     Status: Abnormal   Collection Time: 07/20/23  1:18 AM  Result Value Ref Range   Glucose-Capillary 257 (H) 70 - 99 mg/dL     Comment: Glucose reference range applies only to samples taken after fasting for at least 8 hours.   Comment 1 Notify RN    Comment 2 Document in Chart   Glucose, capillary     Status: Abnormal   Collection Time: 07/20/23  2:36 AM  Result Value Ref Range   Glucose-Capillary 222 (H) 70 - 99 mg/dL    Comment: Glucose reference range applies only to samples taken after fasting for at least 8 hours.   Comment 1 Notify RN    Comment 2 Document in Chart   Comprehensive metabolic panel     Status: Abnormal   Collection Time: 07/20/23  3:47 AM  Result Value Ref Range   Sodium 138 135 - 145 mmol/L   Potassium 3.9 3.5 - 5.1 mmol/L   Chloride 105 98 - 111 mmol/L   CO2 25 22 - 32 mmol/L   Glucose, Bld 135 (H) 70 - 99 mg/dL    Comment: Glucose reference range applies only to samples taken after fasting for at least 8 hours.   BUN 38 (H) 6 - 20 mg/dL   Creatinine, Ser 2.95 (H) 0.44 - 1.00 mg/dL   Calcium 8.0 (L) 8.9 - 10.3 mg/dL   Total Protein 5.6 (L) 6.5 - 8.1 g/dL   Albumin 3.1 (L) 3.5 - 5.0 g/dL   AST 621 (H) 15 - 41 U/L   ALT 170 (H) 0 - 44 U/L   Alkaline Phosphatase 409 (H) 38 - 126 U/L   Total Bilirubin 1.0 0.0 - 1.2 mg/dL   GFR, Estimated 6 (L) >60 mL/min    Comment: (NOTE) Calculated using the CKD-EPI Creatinine Equation (2021)    Anion gap 8 5 - 15    Comment: Performed at [facility], [address], [place], [place] [address]  Bilirubin, direct     Status: None   Collection Time: 07/20/23  3:47 AM  Result Value Ref Range   Bilirubin, Direct 0.2 0.0 - 0.2 mg/dL    Comment: Performed at [facility], [address] [place], [place] [address]  CBC with Differential/Platelet     Status: Abnormal   Collection Time: 07/20/23  3:47 AM  Result Value Ref Range   WBC 10.3 4.0 - 10.5 K/uL   RBC 4.41 3.87 - 5.11 MIL/uL   Hemoglobin 11.6 (L) 12.0 - 15.0 g/dL   HCT 38.2  36.0 - 46.0 %   MCV 86.6 80.0 - 100.0 fL   MCH 26.3 26.0 - 34.0 pg   MCHC 30.4 30.0 - 36.0 g/dL   RDW  91.4 (H) 78.2 - 15.5 %   Platelets 234 150 - 400 K/uL   nRBC 0.3 (H) 0.0 - 0.2 %   Neutrophils Relative % 45 %   Neutro Abs 4.7 1.7 - 7.7 K/uL   Lymphocytes Relative 43 %   Lymphs Abs 4.4 (H) 0.7 - 4.0 K/uL   Monocytes Relative 6 %   Monocytes Absolute 0.7 0.1 - 1.0 K/uL   Eosinophils Relative 5 %   Eosinophils Absolute 0.5 0.0 - 0.5 K/uL   Basophils Relative 1 %   Basophils Absolute 0.1 0.0 - 0.1 K/uL   Immature Granulocytes 0 %   Abs Immature Granulocytes 0.04 0.00 - 0.07 K/uL    Comment: Performed at [facility], [address] [place], [place] [address]  Protime-INR     Status: Abnormal   Collection Time: 07/20/23  3:47 AM  Result Value Ref Range   Prothrombin Time 17.5 (H) 11.4 - 15.2 seconds   INR 1.4 (H) 0.8 - 1.2    Comment: (NOTE) INR goal varies based on device and disease states. Performed at [facility], [address] [place], [place] [address]   Glucose, capillary     Status: Abnormal   Collection Time: 07/20/23  5:16 AM  Result Value Ref Range   Glucose-Capillary 47 (L) 70 - 99 mg/dL    Comment: Glucose reference range applies only to samples taken after fasting for at least 8 hours.  Glucose, capillary     Status: None   Collection Time: 07/20/23  5:54 AM  Result Value Ref Range   Glucose-Capillary 71 70 - 99 mg/dL    Comment: Glucose reference range applies only to samples taken after fasting for at least 8 hours.  Glucose, capillary     Status: Abnormal   Collection Time: 07/20/23  6:16 AM  Result Value Ref Range   Glucose-Capillary 47 (L) 70 - 99 mg/dL    Comment: Glucose reference range applies only to samples taken after fasting for at least 8 hours.  Glucose, capillary     Status: Abnormal   Collection Time: 07/20/23  6:40 AM  Result Value Ref Range   Glucose-Capillary 143 (H) 70 - 99 mg/dL    Comment: Glucose reference range applies only to samples taken after fasting for at least 8 hours.  Glucose, capillary     Status: Abnormal    Collection Time: 07/20/23  8:27 AM  Result Value Ref Range   Glucose-Capillary 62 (L) 70 - 99 mg/dL    Comment: Glucose reference range applies only to samples taken after fasting for at least 8 hours.  Glucose, capillary     Status: Abnormal   Collection Time: 07/20/23 10:04 AM  Result Value Ref Range   Glucose-Capillary 217 (H) 70 - 99 mg/dL    Comment: Glucose reference range applies only to samples taken after fasting for at least 8 hours.  Glucose, capillary     Status: Abnormal   Collection Time: 07/20/23 12:11 PM  Result Value Ref Range   Glucose-Capillary 123 (H) 70 - 99 mg/dL    Comment: Glucose reference range applies only to samples taken after fasting for at least 8 hours.   CT ABDOMEN PELVIS W CONTRAST Result Date: 07/19/2023 CLINICAL DATA:  Abdominal pain following cholecystectomy EXAM: CT ABDOMEN  AND PELVIS WITH CONTRAST TECHNIQUE: Multidetector CT imaging of the abdomen and pelvis was performed using the standard protocol following bolus administration of intravenous contrast. RADIATION DOSE REDUCTION: This exam was performed according to the departmental dose-optimization program which includes automated exposure control, adjustment of the mA and/or kV according to patient size and/or use of iterative reconstruction technique. CONTRAST:  75mL OMNIPAQUE IOHEXOL 350 MG/ML SOLN COMPARISON:  07/16/2023 FINDINGS: Lower chest: No acute abnormality. Hepatobiliary: Gallbladder has been surgically removed. The liver is somewhat heterogeneous in enhancement. Delayed images demonstrate some mild periportal edema which may be related to volume overload given the patient's history of missed dialysis sessions. Minimal fluid is noted within the gallbladder fossa although no discrete abscess is identified. The overall appearance is similar to that seen on prior exams. Pancreas: Unremarkable. No pancreatic ductal dilatation or surrounding inflammatory changes. Spleen: Normal in size without focal  abnormality. Adrenals/Urinary Tract: Adrenal glands are within normal limits. Kidneys demonstrate a normal enhancement pattern. No renal calculi or obstructive changes are noted. The bladder is decompressed. Stomach/Bowel: No obstructive or inflammatory changes of the colon are seen. The appendix is within normal limits. Small bowel and stomach are within normal limits. No true obstructive changes are seen. Vascular/Lymphatic: Aortic atherosclerosis. No enlarged abdominal or pelvic lymph nodes. Reproductive: Uterus and bilateral adnexa are unremarkable. Other: Free fluid is again noted within the pelvis as well as surrounding the liver and spleen overall similar to that seen on prior exam. Musculoskeletal: No acute or significant osseous findings. IMPRESSION: Stable postoperative fluid collection within the gallbladder fossa. Stable ascites as described. Mild periportal edema which may be related to volume overload. Electronically Signed   By: Alcide Clever M.D.   On: 07/19/2023 21:26    PMH:   Past Medical History:  Diagnosis Date   Abscess, gluteal, right 08/24/2013   Anemia 02/19/2012   Bartholin's gland abscess 09/19/2013   Bipolar disorder (HCC)    BV (bacterial vaginosis) 11/24/2015   Depression    Diabetes mellitus type I (HCC) 2001   Diagnosed at age 79 ; Type I   Diarrhea 05/30/2016   DKA (diabetic ketoacidoses) 08/19/2013   Also in 2018   ESRD (end stage renal disease) (HCC)    Gonorrhea 08/2011   Treated in 09/2011   HFrEF (heart failure with reduced ejection fraction) (HCC)    a. 2022 Echo: EF 40%; b. 10/2021 Echo: EF 55%; b. 07/2022 MV: No ischemia. EF 31%; c. 08/2022 Echo: EF 35%, mildly dil RV, sev TR.   History of trichomoniasis 05/31/2016   Hyperlipidemia 03/28/2016   Hypertension    NICM (nonischemic cardiomyopathy) (HCC)    Sepsis (HCC) 09/19/2013    PSH:   Past Surgical History:  Procedure Laterality Date   A/V FISTULAGRAM Right 06/17/2023   Procedure: A/V Fistulagram;   Surgeon: Annice Needy, MD;  Location: [facility] INVASIVE CV LAB;  Service: Cardiovascular;  Laterality: Right;   AV FISTULA PLACEMENT Right 07/06/2022   Procedure: ARTERIOVENOUS GRAFT CREATION;  Surgeon: Cephus Shelling, MD;  Location: [facility] OR;  Service: Vascular;  Laterality: Right;   CESAREAN SECTION N/A 10/05/2019   Procedure: CESAREAN SECTION;  Surgeon: Bella Villa Bing, MD;  Location: [facility] LD ORS;  Service: Obstetrics;  Laterality: N/A;   CHOLECYSTECTOMY N/A 07/02/2023   Procedure: LAPAROSCOPIC CHOLECYSTECTOMY;  Surgeon: Emelia Loron, MD;  [facility] OR;  Service: General;  Laterality: N/A;   INCISION AND DRAINAGE ABSCESS Left 09/28/2019   Procedure: INCISION AND DRAINAGE VULVAR ABCESS;  Surgeon: Emelda Fear,  Cyndi Lennert, MD;  Location: [facility] OR;  Service: Gynecology;  Laterality: Left;   INCISION AND DRAINAGE PERIRECTAL ABSCESS Right 08/18/2013   Procedure: IRRIGATION AND DEBRIDEMENT GLUTEAL ABSCESS;  Surgeon: Axel Filler, MD;  Location: [facility] OR;  Service: General;  Laterality: Right;   INCISION AND DRAINAGE PERIRECTAL ABSCESS Right 09/19/2013   Procedure: IRRIGATION AND DEBRIDEMENT RIGHT GLUTEAL AND LABIAL ABSCESSES;  Surgeon: Axel Filler, MD;  Location: [facility] OR;  Service: General;  Laterality: Right;   INCISION AND DRAINAGE PERIRECTAL ABSCESS Right 09/24/2013   Procedure: IRRIGATION AND DEBRIDEMENT PERIRECTAL ABSCESS;  Surgeon: Cherylynn Ridges, MD;  Location: [facility] OR;  Service: General;  Laterality: Right;    Allergies:  Allergies  Allergen Reactions   Cephalexin Anaphylaxis    Has gotten ceftriaxone in the past    Morphine Itching   Penicillins Hives and Rash   Dilaudid [Hydromorphone] Itching   Oxycodone Itching   Benadryl [Diphenhydramine] Itching   Doxycycline Itching   Methotrexate Derivatives Rash    Medications:   Prior to Admission medications   Medication Sig Start Date End Date Taking? Authorizing Provider  acetaminophen (TYLENOL) 500 MG tablet Take 1,000 mg by mouth as  needed for mild pain (pain score 1-3) or moderate pain (pain score 4-6).    [provider]  albuterol (VENTOLIN HFA) 108 (90 Base) MCG/ACT inhaler Inhale 2 puffs into the lungs every 4 (four) hours as needed for wheezing or shortness of breath. 11/24/22   [provider]  calcitRIOL (ROCALTROL) 0.25 MCG capsule Take 5 capsules (1.25 mcg total) by mouth every Tuesday, Thursday, and Saturday at 6 PM. 07/09/23   Jerald Kief, MD  carvedilol (COREG) 25 MG tablet Take 25 mg by mouth 2 (two) times daily with a meal. 05/15/23   [provider]  Continuous Glucose Sensor (DEXCOM G7 SENSOR) MISC Change sensors every 10 days 09/07/22   Shamleffer, Konrad Dolores, MD  FLUoxetine (PROZAC) 20 MG capsule Take 1 capsule by mouth daily. 03/23/23   [provider]  HYDROcodone-acetaminophen (NORCO/VICODIN) 5-325 MG tablet Take 1 tablet by mouth every 6 (six) hours as needed for moderate pain (pain score 4-6) or severe pain (pain score 7-10). 07/16/23   Durwin Glaze, MD  hydrOXYzine (ATARAX) 25 MG tablet Take 25 mg by mouth in the morning and at bedtime. 05/14/23   [provider]  Insulin Aspart FlexPen (NOVOLOG) 100 UNIT/ML Inject 4 Units into the skin 3 (three) times daily. 05/10/23 07/01/23  [provider]  LANTUS SOLOSTAR 100 UNIT/ML Solostar Pen SMARTSIG:8 Unit(s) SUB-Q Daily    [provider]  losartan (COZAAR) 100 MG tablet Take 1 tablet by mouth daily. 03/25/23   [provider]  ondansetron (ZOFRAN-ODT) 4 MG disintegrating tablet Take 1 tablet (4 mg total) by mouth every 8 (eight) hours as needed for nausea or vomiting. 06/23/23   Laurence Spates, MD  polyethylene glycol (MIRALAX / GLYCOLAX) 17 g packet Take 17 g by mouth daily as needed for mild constipation. 12/07/22   [provider]  rosuvastatin (CRESTOR) 40 MG tablet Take 40 mg by mouth daily. 05/22/22   [provider]  sevelamer carbonate (RENVELA) 800 MG tablet Take  2,400 mg by mouth 3 (three) times daily with meals.    [provider]  SUMAtriptan (IMITREX) 50 MG tablet Take 50 mg by mouth every 2 (two) hours as needed for migraine or headache. 06/20/22   [provider]    Discontinued Meds:   Medications Discontinued  During This Encounter  Medication Reason   ondansetron (ZOFRAN) tablet 4 mg    ondansetron (ZOFRAN) injection 4 mg    insulin aspart (novoLOG) injection 0-5 Units    insulin aspart (novoLOG) injection 0-15 Units    oxyCODONE (Oxy IR/ROXICODONE) immediate release tablet 5 mg    insulin aspart (novoLOG) injection 0-15 Units    insulin aspart (novoLOG) injection 0-6 Units    dextrose 10 % infusion     Social History:  reports that she has never smoked. She has never been exposed to tobacco smoke. She has never used smokeless tobacco. She reports that she does not currently use alcohol. She reports that she does not use drugs.  Family History:   Family History  Problem Relation Age of Onset   Asthma Mother    Carpal tunnel syndrome Mother    Gout Father    Diabetes Paternal Grandmother    Anesthesia problems Neg Hx     Blood pressure 138/89, pulse 77, temperature 98.2 F (36.8 C), temperature source Oral, resp. rate 19, height 5\' 3"  (1.6 m), weight 59 kg, SpO2 95%, unknown if currently breastfeeding. General: Well appearing woman, NAD. Room air Heart: RRR; no murmur Lungs: CTAB Extremities: no LE edema Dialysis Access: R AVG + t/b       Ethelene Hal, MD 07/20/2023, 12:38 PM

## 2023-07-20 NOTE — Plan of Care (Signed)
  Problem: Skin Integrity: Goal: Risk for impaired skin integrity will decrease Outcome: Progressing   Problem: Nutritional: Goal: Maintenance of adequate nutrition will improve Outcome: Progressing   Problem: Skin Integrity: Goal: Risk for impaired skin integrity will decrease Outcome: Progressing   Problem: Tissue Perfusion: Goal: Adequacy of tissue perfusion will improve Outcome: Progressing

## 2023-07-20 NOTE — Progress Notes (Addendum)
 No charge note  Patient seen and examined this morning, admitted overnight, presented to the hospital with abdominal pain, nausea, vomiting.  She has ESRD on TTS dialysis, chronic systolic CHF, who recently on 1/61 underwent a cholecystectomy without complications.  She has been having intermittent nausea, vomiting, right upper quadrant abdominal pain and subjective fevers at home.  She was admitted to the hospital and general surgery was consulted.  CT abdomen pelvis fairly unremarkable but LFTs were elevated.  She is doing better this morning, has no further pain.  General surgery evaluated, did not recommend any further imaging but advance diet as tolerated.  Start with clears and see how she does.  Will get dialysis today per nephrology.  Repeat liver function tests tomorrow morning  Ashley Barbara M. Elvera Lennox, MD, PhD [facility]  Between 7 am - 7 pm you can contact me via Amion (for emergencies) or Securechat (non urgent matters).  I am not available 7 pm - 7 am, please contact night coverage MD/APP via Amion

## 2023-07-21 DIAGNOSIS — R7989 Other specified abnormal findings of blood chemistry: Secondary | ICD-10-CM | POA: Diagnosis not present

## 2023-07-21 LAB — COMPREHENSIVE METABOLIC PANEL
ALT: 171 U/L — ABNORMAL HIGH (ref 0–44)
AST: 302 U/L — ABNORMAL HIGH (ref 15–41)
Albumin: 3 g/dL — ABNORMAL LOW (ref 3.5–5.0)
Alkaline Phosphatase: 437 U/L — ABNORMAL HIGH (ref 38–126)
Anion gap: 14 (ref 5–15)
BUN: 47 mg/dL — ABNORMAL HIGH (ref 6–20)
CO2: 20 mmol/L — ABNORMAL LOW (ref 22–32)
Calcium: 7.7 mg/dL — ABNORMAL LOW (ref 8.9–10.3)
Chloride: 97 mmol/L — ABNORMAL LOW (ref 98–111)
Creatinine, Ser: 9.36 mg/dL — ABNORMAL HIGH (ref 0.44–1.00)
GFR, Estimated: 5 mL/min — ABNORMAL LOW (ref >60)
Glucose, Bld: 443 mg/dL — ABNORMAL HIGH (ref 70–99)
Potassium: 4.9 mmol/L (ref 3.5–5.1)
Sodium: 131 mmol/L — ABNORMAL LOW (ref 135–145)
Total Bilirubin: 1.3 mg/dL — ABNORMAL HIGH (ref 0.0–1.2)
Total Protein: 5.4 g/dL — ABNORMAL LOW (ref 6.5–8.1)

## 2023-07-21 LAB — MAGNESIUM: Magnesium: 2.9 mg/dL — ABNORMAL HIGH (ref 1.7–2.4)

## 2023-07-21 LAB — CBC
HCT: 35.8 % — ABNORMAL LOW (ref 36.0–46.0)
Hemoglobin: 11.2 g/dL — ABNORMAL LOW (ref 12.0–15.0)
MCH: 26.7 pg (ref 26.0–34.0)
MCHC: 31.3 g/dL (ref 30.0–36.0)
MCV: 85.2 fL (ref 80.0–100.0)
Platelets: 203 10*3/uL (ref 150–400)
RBC: 4.2 MIL/uL (ref 3.87–5.11)
RDW: 18.4 % — ABNORMAL HIGH (ref 11.5–15.5)
WBC: 7.9 10*3/uL (ref 4.0–10.5)
nRBC: 0.3 % — ABNORMAL HIGH (ref 0.0–0.2)

## 2023-07-21 LAB — GLUCOSE, CAPILLARY
Glucose-Capillary: 470 mg/dL — ABNORMAL HIGH (ref 70–99)
Glucose-Capillary: 402 mg/dL — ABNORMAL HIGH (ref 70–99)
Glucose-Capillary: 329 mg/dL — ABNORMAL HIGH (ref 70–99)
Glucose-Capillary: 253 mg/dL — ABNORMAL HIGH (ref 70–99)
Glucose-Capillary: 34 mg/dL — CL (ref 70–99)
Glucose-Capillary: 134 mg/dL — ABNORMAL HIGH (ref 70–99)
Glucose-Capillary: 179 mg/dL — ABNORMAL HIGH (ref 70–99)

## 2023-07-21 LAB — PHOSPHORUS: Phosphorus: 5.7 mg/dL — ABNORMAL HIGH (ref 2.5–4.6)

## 2023-07-21 LAB — GLUCOSE, RANDOM: Glucose, Bld: 151 mg/dL — ABNORMAL HIGH (ref 70–99)

## 2023-07-21 MED ORDER — INSULIN GLARGINE-YFGN 100 UNIT/ML ~~LOC~~ SOLN: 8.0000 [IU] | Freq: Every day | SUBCUTANEOUS | Status: DC

## 2023-07-21 MED ORDER — INSULIN ASPART 100 UNIT/ML IJ SOLN
5.0000 [IU] | Freq: Once | INTRAMUSCULAR | Status: AC
  Administered 2023-07-21: 5 [IU] via SUBCUTANEOUS

## 2023-07-21 MED ORDER — HYDROMORPHONE HCL 1 MG/ML IJ SOLN
0.5000 mg | INTRAMUSCULAR | Status: DC | PRN
  Administered 2023-07-21: 0.5 mg via INTRAVENOUS
  Filled 2023-07-21: qty 0.5

## 2023-07-21 MED ORDER — INSULIN ASPART 100 UNIT/ML IJ SOLN: 0.0000 [IU] | Freq: Three times a day (TID) | INTRAMUSCULAR | Status: DC

## 2023-07-21 MED ORDER — INSULIN ASPART 100 UNIT/ML IJ SOLN
0.0000 [IU] | Freq: Every day | INTRAMUSCULAR | Status: DC
Start: 2023-07-21 — End: 2023-07-23
  Administered 2023-07-22: 5 [IU] via SUBCUTANEOUS

## 2023-07-21 MED ORDER — INSULIN GLARGINE 100 UNIT/ML ~~LOC~~ SOLN
8.0000 [IU] | Freq: Every day | SUBCUTANEOUS | Status: DC
Start: 2023-07-21 — End: 2023-07-22
  Administered 2023-07-21 – 2023-07-22 (×2): 8 [IU] via SUBCUTANEOUS
  Filled 2023-07-21 (×3): qty 0.08

## 2023-07-21 MED ORDER — INSULIN ASPART 100 UNIT/ML IJ SOLN
0.0000 [IU] | Freq: Three times a day (TID) | INTRAMUSCULAR | Status: DC
  Administered 2023-07-21: 5 [IU] via SUBCUTANEOUS
  Administered 2023-07-21: 7 [IU] via SUBCUTANEOUS
  Administered 2023-07-22 (×2): 9 [IU] via SUBCUTANEOUS
  Administered 2023-07-22: 7 [IU] via SUBCUTANEOUS
  Administered 2023-07-23: 9 [IU] via SUBCUTANEOUS
  Administered 2023-07-23: 2 [IU] via SUBCUTANEOUS
  Administered 2023-07-23: 9 [IU] via SUBCUTANEOUS

## 2023-07-21 MED ORDER — METHYLPREDNISOLONE SODIUM SUCC 125 MG IJ SOLR
125.0000 mg | Freq: Once | INTRAMUSCULAR | Status: AC
  Administered 2023-07-21: 125 mg via INTRAVENOUS
  Filled 2023-07-21: qty 2

## 2023-07-21 NOTE — Progress Notes (Signed)
 PROGRESS NOTE  Ashley Freeman  DOB: 1990/05/05  PCP: [facility] [id number]  DOA: 07/19/2023  LOS: 1 day  Hospital Day: 3  Brief narrative: Ashley Freeman is a 32 y.o. female with PMH significant for ESRD HD TTS, chronic systolic CHF with EF 25 to 30%, DM1, prolonged QTc, bipolar disorder On 3/10 she underwent a cholecystectomy without complication.   Since discharge, she continued to have nausea, vomiting, abdominal pain.  3/21, symptoms worsened and hence presented to ED.  In the ED, afebrile hemodynamically stable. LFTs were elevated CT abdomen pelvis showed a postop small fluid collections.  General surgery was consulted Admitted to [facility]  Subjective: Patient was seen and examined this morning.  Pleasant young African-American female.  Lying down in bed.  Not in distress.  No family at bedside. General surgery follow-up note from this morning noted.  Assessment and plan: Elevated LFTs Abdominal pain Presented with progressive abdomen pain, nausea, vomiting 10 days after cholecystectomy  LFTs elevated.  CT abdomen as above Per general surgery note, no signs of postoperative complication. Total bili slightly up today.  Other LFTs stable. Noted clear liquid diet started today. Labs to be repeated tomorrow. Defer to surgery for imaging need Recent Labs  Lab 07/16/23 0008 07/16/23 2055 07/19/23 1822 07/20/23 0347 07/21/23 0615  AST 27 145* 253* 298* 302*  ALT 31 64* 156* 170* 171*  ALKPHOS 253* 283* 418* 409* 437*  BILITOT 0.7 0.7 1.2 1.0 1.3*  BILIDIR  --   --   --  0.2  --   PROT 5.5* 6.5 6.3* 5.6* 5.4*  ALBUMIN 2.9* 3.4* 3.5 3.1* 3.0*  INR  --   --   --  1.4*  --   LIPASE  --  22 37  --   --   PLT 316 312 256 234 203   ESRD HD TTS Nephrology following.   Chronic systolic CHF  essential hypertension Last echo 3/3 with EF 25 to 30% PTA meds- Coreg 25 mg twice daily, losartan 100 mg daily Currently continued on  both  Type 1 diabetes mellitus uncontrolled Hyperglycemia/hypoglycemia A1c 10.6 PTA meds-Lantus 8 units daily and Premeal sliding some insulin Noted blood sugar trend.  Was hypoglycemic yesterday and hence long-acting insulin was not given.  Blood sugar level went as high as 470 this morning. I have resumed Lantus at home dose of 8 units this morning. Also start sliding scale insulin with Accu-Cheks. Recent Labs  Lab 07/20/23 1634 07/20/23 1918 07/21/23 0458 07/21/23 0635 07/21/23 0842  GLUCAP 229* 306* 470* 402* 329*   H/o prolonged Qtc Obtain EKG. Watch out electrolytes Recent Labs  Lab 07/16/23 2055 07/19/23 1822 07/19/23 1828 07/20/23 0347 07/21/23 0615  K 3.9 4.7 4.7 3.9 4.9  MG  --   --   --   --  2.9*  PHOS  --   --   --   --  5.7*   HLD Crestor  Bipolar disorder Prozac resumed   Mobility: Encourage ambulation  Goals of care   Code Status: Full Code     DVT prophylaxis:  heparin injection 5,000 Units Start: 07/20/23 1400   Antimicrobials: None Fluid: None Consultants: General surgery, nephrology Family Communication: None at bedside  Status: Inpatient Level of care:  Med-Surg   Patient is from: Home Needs to continue in-hospital care: Repeat labs tomorrow Anticipated d/c to: Hopefully home in 1 to 2 days   Diet:  Diet Order  Diet renal with fluid restriction Fluid restriction: Other (see comments); Room service appropriate? Yes; Fluid consistency: Thin  Diet effective now                   Scheduled Meds:  calcitRIOL  1.25 mcg Oral Q T,Th,Sat-1800   carvedilol  25 mg Oral BID WC   Chlorhexidine Gluconate Cloth  6 each Topical Q0600   FLUoxetine  20 mg Oral Daily   heparin  5,000 Units Subcutaneous Q8H   insulin aspart  0-5 Units Subcutaneous QHS   insulin aspart  0-9 Units Subcutaneous TID WC   insulin glargine  8 Units Subcutaneous Daily   losartan  100 mg Oral Daily   rosuvastatin  40 mg Oral Daily   sevelamer  carbonate  2,400 mg Oral TID WC    PRN meds: dextrose, HYDROcodone-acetaminophen, prochlorperazine, senna-docusate   Infusions:    Antimicrobials: Anti-infectives (From admission, onward)    None       Objective: Vitals:   07/20/23 1917 07/21/23 0456  BP: 138/88 (!) 150/90  Pulse: 69 71  Resp: 16 16  Temp: 98.1 F (36.7 C) 97.6 F (36.4 C)  SpO2: 98% 96%   No intake or output data in the 24 hours ending 07/21/23 1056 Filed Weights   07/19/23 1715  Weight: 59 kg   Weight change:  Body mass index is 23.03 kg/m.   Physical Exam: General exam: Pleasant, young African-American female Skin: No rashes, lesions or ulcers. HEENT: Atraumatic, normocephalic, no obvious bleeding Lungs: Clear to auscultation bilaterally,  CVS: S1, S2, no murmur,   GI/Abd: Soft, mild RUQ tenderness, nondistended, bowel sound present,   CNS: Alert, awake, oriented x 3 Psychiatry: Mood appropriate,  Extremities: Trace bilateral pedal edema, no calf tenderness  Data Review: I have personally reviewed the laboratory data and studies available.  F/u labs  Unresulted Labs (From admission, onward)     Start     Ordered   07/22/23 0500  Comprehensive metabolic panel  Tomorrow morning,   R        07/21/23 0908   07/19/23 1945  Hepatitis B surface antibody,quantitative  (New Admission Hemo Labs (Hepatitis B))  Once,   URGENT        07/19/23 1944   07/19/23 1730  Urinalysis, Routine w reflex microscopic -Urine, Clean Catch  Once,   URGENT       Question:  Specimen Source  Answer:  Urine, Clean Catch   07/19/23 1729            Total time spent in review of labs and imaging, patient evaluation, formulation of plan, documentation and communication with family: 45 minutes  Signed, Lorin Glass, MD [facility] 07/21/2023

## 2023-07-21 NOTE — Progress Notes (Addendum)
 Ashley Freeman is an 33 y.o. female with DM, , bipolar disorder, prolonged QT, HFrEF cardiomyopathy, ESRD on dialysis Tuesday Thursdays and Saturdays at [facility] with last outpt treatment on Wednesday.  She had treatment on Tuesday and Wednesday of this past week.  Recently underwent a cholecystectomy without complication but has since had continued nausea vomiting and abdominal pain.  CT scan with and without contrast showed a postoperative fluid collection within the gallbladder fossa that was stable, stable ascites, mild periorbital edema.    [facility].  TTS 3h 350/500 2K/2.5Ca  EDW 57.5 kg AVG 16g  -Tums EX 750 q HD -Cinacalcet 60 mg three times per week -Calcitriol 1.25 mcg three times per week -Heparin 1600 units bolus then 600 units/hr   Assessment/Plan: 1. Abdominal pain with s/p lap chole with LOA. Surgery is advancing diet do not think there is any postoperative complication.  CT imaging stable.  Pain seems to be worse when she eats more.  Limited to small amounts at this current time. 2. ESRD -  HD TTS. Continue on schedule - Scheduled for Saturday but had to be held over till today because of census.  I confirmed with the dialysis RN that she is on the list for today but d/w Dr. Arlean Hopping and there are 9 holdovers from yest.  There is a high chance she is held over again until Monday.  If that is the case we will have her 1st shift.  Fortunately she is not uremic and numbers look fairly stable. 3.T1DM - per primary  5. HTN/volume-  BP ok. Not to EDW, UF as able.  6. Anemia - Hgb 11.2 - monitor without ESA for now. 7. MBD - Corr Ca ok. Continue binder, VDRA, sensipar. 8. Nutrition - Alb low, continue protein supps.  Subjective: Feeling a little bit better but still abdominal pain is present especially if she eats too much.  Seems to be better when she just eats a small amount.  Denies any fever chills diaphoresis or myalgias.    Chemistry and CBC: Creatinine  Date/Time Value Ref Range Status  11/12/2013 04:23 AM 0.59 (L) 0.60 - 1.30 mg/dL Final  78/29/5621 30:86 AM 0.70 0.60 - 1.30 mg/dL Final  57/84/6962 95:28 PM 0.71 0.60 - 1.30 mg/dL Final  41/32/4401 02:72 AM 0.69 0.60 - 1.30 mg/dL Final  53/66/4403 47:42 AM 0.84 0.60 - 1.30 mg/dL Final  59/56/3875 64:33 AM 0.83 0.60 - 1.30 mg/dL Final  29/51/8841 66:06 AM 0.99 0.60 - 1.30 mg/dL Final  30/16/0109 32:35 PM 1.01 0.60 - 1.30 mg/dL Final  57/32/2025 42:70 PM 1.44 (H) 0.60 - 1.30 mg/dL Final   Creat  Date/Time Value Ref Range Status  08/24/2013 09:57 AM 0.51 0.50 - 1.10 mg/dL Final  62/37/6283 15:17 PM 0.59 0.50 - 1.10 mg/dL Final   Creatinine, Ser  Date/Time Value Ref Range Status  07/21/2023 06:15 AM 9.36 (H) 0.44 - 1.00 mg/dL Final  61/60/7371 06:26 AM 8.96 (H) 0.44 - 1.00 mg/dL Final  94/85/4627 03:50 PM 9.00 (H) 0.44 - 1.00 mg/dL Final  09/38/1829 93:71 PM 8.52 (H) 0.44 - 1.00 mg/dL Final  69/67/8938 10:17 PM 7.54 (H) 0.44 - 1.00 mg/dL Final  51/05/5850 77:82 AM 6.55 (H) 0.44 - 1.00 mg/dL Final  42/35/3614 43:15 AM 8.77 (H) 0.44 - 1.00 mg/dL Final  40/11/6759 95:09 AM 6.02 (H) 0.44 - 1.00 mg/dL Final  32/67/1245 80:99 AM 5.06 (H) 0.44 - 1.00 mg/dL Final  83/38/2505 39:76 AM 7.36 (H) 0.44 -  1.00 mg/dL Final  91/47/8295 62:13 AM 6.29 (H) 0.44 - 1.00 mg/dL Final  08/65/7846 96:29 AM 7.92 (H) 0.44 - 1.00 mg/dL Final  52/84/1324 40:10 AM 6.98 (H) 0.44 - 1.00 mg/dL Final  27/25/3664 40:34 PM 6.12 (H) 0.44 - 1.00 mg/dL Final  74/25/9563 87:56 PM 5.80 (H) 0.44 - 1.00 mg/dL Final  43/32/9518 84:16 AM 8.73 (H) 0.44 - 1.00 mg/dL Final  60/63/0160 10:93 PM 8.40 (H) 0.44 - 1.00 mg/dL Final  23/55/7322 02:54 PM 8.35 (H) 0.44 - 1.00 mg/dL Final  27/09/2374 28:31 PM 8.16 (H) 0.44 - 1.00 mg/dL Final  51/76/1607 37:10 PM 7.89 (H) 0.44 - 1.00 mg/dL Final  62/69/4854 62:70 AM 8.10 (H) 0.44 - 1.00 mg/dL Final  35/00/9381 82:99 PM 6.42 (H) 0.44 - 1.00 mg/dL Final   37/16/9678 93:81 AM 8.93 (H) 0.44 - 1.00 mg/dL Final  01/75/1025 85:27 PM 8.72 (H) 0.44 - 1.00 mg/dL Final  78/24/2353 61:44 PM 8.56 (H) 0.44 - 1.00 mg/dL Final  31/54/0086 76:19 AM 8.52 (H) 0.44 - 1.00 mg/dL Final  50/93/2671 24:58 AM 8.29 (H) 0.44 - 1.00 mg/dL Final  09/98/3382 50:53 AM 8.29 (H) 0.44 - 1.00 mg/dL Final  97/67/3419 37:90 PM 6.78 (H) 0.44 - 1.00 mg/dL Final  24/12/7351 29:92 PM 6.40 (H) 0.44 - 1.00 mg/dL Final  42/68/3419 62:22 PM 6.08 (H) 0.44 - 1.00 mg/dL Final  97/98/9211 94:17 PM 7.78 (H) 0.44 - 1.00 mg/dL Final  40/81/4481 85:63 PM 7.48 (H) 0.44 - 1.00 mg/dL Final  14/97/0263 78:58 AM 6.99 (H) 0.44 - 1.00 mg/dL Final  85/06/7739 28:78 PM 6.17 (H) 0.44 - 1.00 mg/dL Final  67/67/2094 70:96 AM 5.30 (H) 0.44 - 1.00 mg/dL Final  28/36/6294 76:54 AM 3.80 (H) 0.44 - 1.00 mg/dL Final  65/06/5463 68:12 AM 3.90 (H) 0.44 - 1.00 mg/dL Final  75/17/0017 49:44 AM 4.13 (H) 0.44 - 1.00 mg/dL Final  96/75/9163 84:66 AM 4.61 (H) 0.44 - 1.00 mg/dL Final  59/93/5701 77:93 AM 4.92 (H) 0.44 - 1.00 mg/dL Final  90/30/0923 30:07 AM 4.95 (H) 0.44 - 1.00 mg/dL Final  62/26/3335 45:62 PM 4.84 (H) 0.44 - 1.00 mg/dL Final  56/38/9373 42:87 PM 5.24 (H) 0.44 - 1.00 mg/dL Final  68/02/5725 20:35 PM 5.33 (H) 0.44 - 1.00 mg/dL Final  59/74/1638 45:36 AM 5.54 (H) 0.44 - 1.00 mg/dL Final  46/80/3212 24:82 AM 2.89 (H) 0.44 - 1.00 mg/dL Final  50/06/7046 88:91 AM 3.13 (H) 0.44 - 1.00 mg/dL Final  69/45/0388 82:80 AM 3.17 (H) 0.44 - 1.00 mg/dL Final  03/49/1791 50:56 PM 3.19 (H) 0.44 - 1.00 mg/dL Final  97/94/8016 55:37 PM 3.55 (H) 0.44 - 1.00 mg/dL Final  48/27/0786 75:44 PM 3.64 (H) 0.44 - 1.00 mg/dL Final   Recent Labs  Lab 07/16/23 0008 07/16/23 2055 07/19/23 1822 07/19/23 1828 07/20/23 0347 07/21/23 0615  NA 137 138 135 135 138 131*  K 3.6 3.9 4.7 4.7 3.9 4.9  CL 100 100 98 103 105 97*  CO2 23 22 22   --  25 20*  GLUCOSE 169* 145* 489* 487* 135* 443*  BUN 26* 29* 38* 45* 38* 47*   CREATININE 6.55* 7.54* 8.52* 9.00* 8.96* 9.36*  CALCIUM 7.7* 8.6* 8.6*  --  8.0* 7.7*  PHOS  --   --   --   --   --  5.7*   Recent Labs  Lab 07/16/23 0008 07/16/23 2055 07/19/23 1822 07/19/23 1828 07/20/23 0347 07/21/23 0615  WBC 8.2 9.0 8.4  --  10.3 7.9  NEUTROABS 3.5 4.0  --   --  4.7  --   HGB 12.1 12.1 12.1 13.9 11.6* 11.2*  HCT 39.4 39.6 40.1 41.0 38.2 35.8*  MCV 85.3 86.1 87.9  --  86.6 85.2  PLT 316 312 256  --  234 203   Liver Function Tests: Recent Labs  Lab 07/19/23 1822 07/20/23 0347 07/21/23 0615  AST 253* 298* 302*  ALT 156* 170* 171*  ALKPHOS 418* 409* 437*  BILITOT 1.2 1.0 1.3*  PROT 6.3* 5.6* 5.4*  ALBUMIN 3.5 3.1* 3.0*   Recent Labs  Lab 07/16/23 2055 07/19/23 1822  LIPASE 22 37   No results for input(s): "AMMONIA" in the last 168 hours. Cardiac Enzymes: No results for input(s): "CKTOTAL", "CKMB", "CKMBINDEX", "TROPONINI" in the last 168 hours. Iron Studies: No results for input(s): "IRON", "TIBC", "TRANSFERRIN", "FERRITIN" in the last 72 hours. PT/INR: @LABRCNTIP (inr:5)  Xrays/Other Studies: ) Results for orders placed or performed during the hospital encounter of 07/19/23 (from the past 48 hours)  POC CBG, ED     Status: Abnormal   Collection Time: 07/19/23  6:21 PM  Result Value Ref Range   Glucose-Capillary 439 (H) 70 - 99 mg/dL    Comment: Glucose reference range applies only to samples taken after fasting for at least 8 hours.  Lipase, blood     Status: None   Collection Time: 07/19/23  6:22 PM  Result Value Ref Range   Lipase 37 11 - 51 U/L    Comment: Performed at [facility], [address] [place], [place] [address]  Comprehensive metabolic panel     Status: Abnormal   Collection Time: 07/19/23  6:22 PM  Result Value Ref Range   Sodium 135 135 - 145 mmol/L   Potassium 4.7 3.5 - 5.1 mmol/L   Chloride 98 98 - 111 mmol/L   CO2 22 22 - 32 mmol/L   Glucose, Bld 489 (H) 70 - 99 mg/dL    Comment: Glucose reference range  applies only to samples taken after fasting for at least 8 hours.   BUN 38 (H) 6 - 20 mg/dL   Creatinine, Ser 4.09 (H) 0.44 - 1.00 mg/dL   Calcium 8.6 (L) 8.9 - 10.3 mg/dL   Total Protein 6.3 (L) 6.5 - 8.1 g/dL   Albumin 3.5 3.5 - 5.0 g/dL   AST 811 (H) 15 - 41 U/L   ALT 156 (H) 0 - 44 U/L   Alkaline Phosphatase 418 (H) 38 - 126 U/L   Total Bilirubin 1.2 0.0 - 1.2 mg/dL   GFR, Estimated 6 (L) >60 mL/min    Comment: (NOTE) Calculated using the CKD-EPI Creatinine Equation (2021)    Anion gap 15 5 - 15    Comment: Performed at [facility], [address], [place], [place] [address]  CBC     Status: Abnormal   Collection Time: 07/19/23  6:22 PM  Result Value Ref Range   WBC 8.4 4.0 - 10.5 K/uL   RBC 4.56 3.87 - 5.11 MIL/uL   Hemoglobin 12.1 12.0 - 15.0 g/dL   HCT 29.5 62.1 - 30.8 %   MCV 87.9 80.0 - 100.0 fL   MCH 26.5 26.0 - 34.0 pg   MCHC 30.2 30.0 - 36.0 g/dL   RDW 65.7 (H) 84.6 - 96.2 %   Platelets 256 150 - 400 K/uL   nRBC 0.2 0.0 - 0.2 %    Comment: Performed at [facility], [address], [place], [place] [address]  hCG, serum, qualitative     Status: None   Collection Time: 07/19/23  6:22 PM  Result Value Ref Range   Preg, Serum NEGATIVE NEGATIVE    Comment:        THE SENSITIVITY OF THIS METHODOLOGY IS >10 mIU/mL. Performed at [facility], [address] [place], [place] [address]   I-stat chem 8, ED (not at [facility], [facility] or [facility])     Status: Abnormal   Collection Time: 07/19/23  6:28 PM  Result Value Ref Range   Sodium 135 135 - 145 mmol/L   Potassium 4.7 3.5 - 5.1 mmol/L   Chloride 103 98 - 111 mmol/L   BUN 45 (H) 6 - 20 mg/dL   Creatinine, Ser 0.98 (H) 0.44 - 1.00 mg/dL   Glucose, Bld 119 (H) 70 - 99 mg/dL    Comment: Glucose reference range applies only to samples taken after fasting for at least 8 hours.   Calcium, Ion 0.99 (L) 1.15 - 1.40 mmol/L   TCO2 24 22 - 32 mmol/L   Hemoglobin 13.9 12.0 - 15.0 g/dL   HCT 14.7 82.9 - 56.2 %   Hepatitis B surface antigen     Status: None   Collection Time: 07/19/23  8:56 PM  Result Value Ref Range   Hepatitis B Surface Ag NON REACTIVE NON REACTIVE    Comment: Performed at [facility], [address] [place], [place] [address]  CBG monitoring, ED     Status: Abnormal   Collection Time: 07/20/23  1:18 AM  Result Value Ref Range   Glucose-Capillary 257 (H) 70 - 99 mg/dL    Comment: Glucose reference range applies only to samples taken after fasting for at least 8 hours.   Comment 1 Notify RN    Comment 2 Document in Chart   Glucose, capillary     Status: Abnormal   Collection Time: 07/20/23  2:36 AM  Result Value Ref Range   Glucose-Capillary 222 (H) 70 - 99 mg/dL    Comment: Glucose reference range applies only to samples taken after fasting for at least 8 hours.   Comment 1 Notify RN    Comment 2 Document in Chart   Comprehensive metabolic panel     Status: Abnormal   Collection Time: 07/20/23  3:47 AM  Result Value Ref Range   Sodium 138 135 - 145 mmol/L   Potassium 3.9 3.5 - 5.1 mmol/L   Chloride 105 98 - 111 mmol/L   CO2 25 22 - 32 mmol/L   Glucose, Bld 135 (H) 70 - 99 mg/dL    Comment: Glucose reference range applies only to samples taken after fasting for at least 8 hours.   BUN 38 (H) 6 - 20 mg/dL   Creatinine, Ser 5.78 (H) 0.44 - 1.00 mg/dL   Calcium 8.0 (L) 8.9 - 10.3 mg/dL   Total Protein 5.6 (L) 6.5 - 8.1 g/dL   Albumin 3.1 (L) 3.5 - 5.0 g/dL   AST 469 (H) 15 - 41 U/L   ALT 170 (H) 0 - 44 U/L   Alkaline Phosphatase 409 (H) 38 - 126 U/L   Total Bilirubin 1.0 0.0 - 1.2 mg/dL   GFR, Estimated 6 (L) >60 mL/min    Comment: (NOTE) Calculated using the CKD-EPI Creatinine Equation (2021)    Anion gap 8 5 - 15    Comment: Performed at [facility], [address], [place], [place] [address]  Bilirubin, direct     Status: None   Collection Time: 07/20/23  3:47 AM  Result Value Ref Range   Bilirubin, Direct 0.2 0.0 - 0.2 mg/dL    Comment:  Performed at [facility], [address], [place], [place] [address]  CBC with Differential/Platelet     Status: Abnormal   Collection Time: 07/20/23  3:47 AM  Result Value Ref Range   WBC 10.3 4.0 - 10.5 K/uL   RBC 4.41 3.87 - 5.11 MIL/uL   Hemoglobin 11.6 (L) 12.0 - 15.0 g/dL   HCT 19.1 47.8 - 29.5 %   MCV 86.6 80.0 - 100.0 fL   MCH 26.3 26.0 - 34.0 pg   MCHC 30.4 30.0 - 36.0 g/dL   RDW 62.1 (H) 30.8 - 65.7 %   Platelets 234 150 - 400 K/uL   nRBC 0.3 (H) 0.0 - 0.2 %   Neutrophils Relative % 45 %   Neutro Abs 4.7 1.7 - 7.7 K/uL   Lymphocytes Relative 43 %   Lymphs Abs 4.4 (H) 0.7 - 4.0 K/uL   Monocytes Relative 6 %   Monocytes Absolute 0.7 0.1 - 1.0 K/uL   Eosinophils Relative 5 %   Eosinophils Absolute 0.5 0.0 - 0.5 K/uL   Basophils Relative 1 %   Basophils Absolute 0.1 0.0 - 0.1 K/uL   Immature Granulocytes 0 %   Abs Immature Granulocytes 0.04 0.00 - 0.07 K/uL    Comment: Performed at [facility], [address], [place], [place] [address]  Protime-INR     Status: Abnormal   Collection Time: 07/20/23  3:47 AM  Result Value Ref Range   Prothrombin Time 17.5 (H) 11.4 - 15.2 seconds   INR 1.4 (H) 0.8 - 1.2    Comment: (NOTE) INR goal varies based on device and disease states. Performed at [facility], [address] [place], [place] [address]   Glucose, capillary     Status: Abnormal   Collection Time: 07/20/23  5:16 AM  Result Value Ref Range   Glucose-Capillary 47 (L) 70 - 99 mg/dL    Comment: Glucose reference range applies only to samples taken after fasting for at least 8 hours.  Glucose, capillary     Status: None   Collection Time: 07/20/23  5:54 AM  Result Value Ref Range   Glucose-Capillary 71 70 - 99 mg/dL    Comment: Glucose reference range applies only to samples taken after fasting for at least 8 hours.  Glucose, capillary     Status: Abnormal   Collection Time: 07/20/23  6:16 AM  Result Value Ref Range   Glucose-Capillary 47 (L) 70  - 99 mg/dL    Comment: Glucose reference range applies only to samples taken after fasting for at least 8 hours.  Glucose, capillary     Status: Abnormal   Collection Time: 07/20/23  6:40 AM  Result Value Ref Range   Glucose-Capillary 143 (H) 70 - 99 mg/dL    Comment: Glucose reference range applies only to samples taken after fasting for at least 8 hours.  Glucose, capillary     Status: Abnormal   Collection Time: 07/20/23  8:27 AM  Result Value Ref Range   Glucose-Capillary 62 (L) 70 - 99 mg/dL    Comment: Glucose reference range applies only to samples taken after fasting for at least 8 hours.  Glucose, capillary     Status: Abnormal   Collection Time: 07/20/23 10:04 AM  Result Value  Ref Range   Glucose-Capillary 217 (H) 70 - 99 mg/dL    Comment: Glucose reference range applies only to samples taken after fasting for at least 8 hours.  Glucose, capillary     Status: Abnormal   Collection Time: 07/20/23 12:11 PM  Result Value Ref Range   Glucose-Capillary 123 (H) 70 - 99 mg/dL    Comment: Glucose reference range applies only to samples taken after fasting for at least 8 hours.  Glucose, capillary     Status: Abnormal   Collection Time: 07/20/23  2:06 PM  Result Value Ref Range   Glucose-Capillary 212 (H) 70 - 99 mg/dL    Comment: Glucose reference range applies only to samples taken after fasting for at least 8 hours.  Glucose, capillary     Status: Abnormal   Collection Time: 07/20/23  4:34 PM  Result Value Ref Range   Glucose-Capillary 229 (H) 70 - 99 mg/dL    Comment: Glucose reference range applies only to samples taken after fasting for at least 8 hours.  Glucose, capillary     Status: Abnormal   Collection Time: 07/20/23  7:18 PM  Result Value Ref Range   Glucose-Capillary 306 (H) 70 - 99 mg/dL    Comment: Glucose reference range applies only to samples taken after fasting for at least 8 hours.  Glucose, capillary     Status: Abnormal   Collection Time: 07/21/23  4:58 AM   Result Value Ref Range   Glucose-Capillary 470 (H) 70 - 99 mg/dL    Comment: Glucose reference range applies only to samples taken after fasting for at least 8 hours.  Comprehensive metabolic panel     Status: Abnormal   Collection Time: 07/21/23  6:15 AM  Result Value Ref Range   Sodium 131 (L) 135 - 145 mmol/L    Comment: DELTA CHECK NOTED   Potassium 4.9 3.5 - 5.1 mmol/L   Chloride 97 (L) 98 - 111 mmol/L   CO2 20 (L) 22 - 32 mmol/L   Glucose, Bld 443 (H) 70 - 99 mg/dL    Comment: Glucose reference range applies only to samples taken after fasting for at least 8 hours.   BUN 47 (H) 6 - 20 mg/dL   Creatinine, Ser 4.09 (H) 0.44 - 1.00 mg/dL   Calcium 7.7 (L) 8.9 - 10.3 mg/dL   Total Protein 5.4 (L) 6.5 - 8.1 g/dL   Albumin 3.0 (L) 3.5 - 5.0 g/dL   AST 811 (H) 15 - 41 U/L   ALT 171 (H) 0 - 44 U/L   Alkaline Phosphatase 437 (H) 38 - 126 U/L   Total Bilirubin 1.3 (H) 0.0 - 1.2 mg/dL   GFR, Estimated 5 (L) >60 mL/min    Comment: (NOTE) Calculated using the CKD-EPI Creatinine Equation (2021)    Anion gap 14 5 - 15    Comment: Performed at [facility], [address] [place], [place] [address]  CBC     Status: Abnormal   Collection Time: 07/21/23  6:15 AM  Result Value Ref Range   WBC 7.9 4.0 - 10.5 K/uL   RBC 4.20 3.87 - 5.11 MIL/uL   Hemoglobin 11.2 (L) 12.0 - 15.0 g/dL   HCT 29.5 (L) 62.1 - 30.8 %   MCV 85.2 80.0 - 100.0 fL   MCH 26.7 26.0 - 34.0 pg   MCHC 31.3 30.0 - 36.0 g/dL   RDW 65.7 (H) 84.6 - 96.2 %   Platelets 203 150 - 400  K/uL   nRBC 0.3 (H) 0.0 - 0.2 %    Comment: Performed at [facility], [address] [place], [place] [address]  Magnesium     Status: Abnormal   Collection Time: 07/21/23  6:15 AM  Result Value Ref Range   Magnesium 2.9 (H) 1.7 - 2.4 mg/dL    Comment: Performed at [facility], [address] [place], [place] [address]  Phosphorus     Status: Abnormal   Collection Time: 07/21/23  6:15 AM  Result Value Ref Range    Phosphorus 5.7 (H) 2.5 - 4.6 mg/dL    Comment: Performed at [facility], [address] [place], [place] [address]  Glucose, capillary     Status: Abnormal   Collection Time: 07/21/23  6:35 AM  Result Value Ref Range   Glucose-Capillary 402 (H) 70 - 99 mg/dL    Comment: Glucose reference range applies only to samples taken after fasting for at least 8 hours.   CT ABDOMEN PELVIS W CONTRAST Result Date: 07/19/2023 CLINICAL DATA:  Abdominal pain following cholecystectomy EXAM: CT ABDOMEN AND PELVIS WITH CONTRAST TECHNIQUE: Multidetector CT imaging of the abdomen and pelvis was performed using the standard protocol following bolus administration of intravenous contrast. RADIATION DOSE REDUCTION: This exam was performed according to the departmental dose-optimization program which includes automated exposure control, adjustment of the mA and/or kV according to patient size and/or use of iterative reconstruction technique. CONTRAST:  75mL OMNIPAQUE IOHEXOL 350 MG/ML SOLN COMPARISON:  07/16/2023 FINDINGS: Lower chest: No acute abnormality. Hepatobiliary: Gallbladder has been surgically removed. The liver is somewhat heterogeneous in enhancement. Delayed images demonstrate some mild periportal edema which may be related to volume overload given the patient's history of missed dialysis sessions. Minimal fluid is noted within the gallbladder fossa although no discrete abscess is identified. The overall appearance is similar to that seen on prior exams. Pancreas: Unremarkable. No pancreatic ductal dilatation or surrounding inflammatory changes. Spleen: Normal in size without focal abnormality. Adrenals/Urinary Tract: Adrenal glands are within normal limits. Kidneys demonstrate a normal enhancement pattern. No renal calculi or obstructive changes are noted. The bladder is decompressed. Stomach/Bowel: No obstructive or inflammatory changes of the colon are seen. The appendix is within normal limits. Small bowel  and stomach are within normal limits. No true obstructive changes are seen. Vascular/Lymphatic: Aortic atherosclerosis. No enlarged abdominal or pelvic lymph nodes. Reproductive: Uterus and bilateral adnexa are unremarkable. Other: Free fluid is again noted within the pelvis as well as surrounding the liver and spleen overall similar to that seen on prior exam. Musculoskeletal: No acute or significant osseous findings. IMPRESSION: Stable postoperative fluid collection within the gallbladder fossa. Stable ascites as described. Mild periportal edema which may be related to volume overload. Electronically Signed   By: Alcide Clever M.D.   On: 07/19/2023 21:26    PMH:   Past Medical History:  Diagnosis Date   Abscess, gluteal, right 08/24/2013   Anemia 02/19/2012   Bartholin's gland abscess 09/19/2013   Bipolar disorder (HCC)    BV (bacterial vaginosis) 11/24/2015   Depression    Diabetes mellitus type I (HCC) 2001   Diagnosed at age 30 ; Type I   Diarrhea 05/30/2016   DKA (diabetic ketoacidoses) 08/19/2013   Also in 2018   ESRD (end stage renal disease) (HCC)    Gonorrhea 08/2011   Treated in 09/2011   HFrEF (heart failure with reduced ejection fraction) (HCC)    a. 2022 Echo: EF  40%; b. 10/2021 Echo: EF 55%; b. 07/2022 MV: No ischemia. EF 31%; c. 08/2022 Echo: EF 35%, mildly dil RV, sev TR.   History of trichomoniasis 05/31/2016   Hyperlipidemia 03/28/2016   Hypertension    NICM (nonischemic cardiomyopathy) (HCC)    Sepsis (HCC) 09/19/2013    PSH:   Past Surgical History:  Procedure Laterality Date   A/V FISTULAGRAM Right 06/17/2023   Procedure: A/V Fistulagram;  Surgeon: Annice Needy, MD;  Location: [facility] INVASIVE CV LAB;  Service: Cardiovascular;  Laterality: Right;   AV FISTULA PLACEMENT Right 07/06/2022   Procedure: ARTERIOVENOUS GRAFT CREATION;  Surgeon: Cephus Shelling, MD;  Location: [facility] OR;  Service: Vascular;  Laterality: Right;   CESAREAN SECTION N/A 10/05/2019   Procedure:  CESAREAN SECTION;  Surgeon: [place] Bing, MD;  Location: [facility] LD ORS;  Service: Obstetrics;  Laterality: N/A;   CHOLECYSTECTOMY N/A 07/02/2023   Procedure: LAPAROSCOPIC CHOLECYSTECTOMY;  Surgeon: Emelia Loron, MD;  Location: [facility] OR;  Service: General;  Laterality: N/A;   INCISION AND DRAINAGE ABSCESS Left 09/28/2019   Procedure: INCISION AND DRAINAGE VULVAR ABCESS;  Surgeon: Tilda Burrow, MD;  Location: [facility] OR;  Service: Gynecology;  Laterality: Left;   INCISION AND DRAINAGE PERIRECTAL ABSCESS Right 08/18/2013   Procedure: IRRIGATION AND DEBRIDEMENT GLUTEAL ABSCESS;  Surgeon: Axel Filler, MD;  Location: [facility] OR;  Service: General;  Laterality: Right;   INCISION AND DRAINAGE PERIRECTAL ABSCESS Right 09/19/2013   Procedure: IRRIGATION AND DEBRIDEMENT RIGHT GLUTEAL AND LABIAL ABSCESSES;  Surgeon: Axel Filler, MD;  Location: [facility] OR;  Service: General;  Laterality: Right;   INCISION AND DRAINAGE PERIRECTAL ABSCESS Right 09/24/2013   Procedure: IRRIGATION AND DEBRIDEMENT PERIRECTAL ABSCESS;  Surgeon: Cherylynn Ridges, MD;  Location: [facility] OR;  Service: General;  Laterality: Right;    Allergies:  Allergies  Allergen Reactions   Cephalexin Anaphylaxis    Has gotten ceftriaxone in the past    Morphine Itching   Penicillins Hives and Rash   Benadryl [Diphenhydramine] Itching   Dilaudid [Hydromorphone] Itching   Doxycycline Itching   Methotrexate Derivatives Rash   Oxycodone Itching    Medications:   Prior to Admission medications   Medication Sig Start Date End Date Taking? Authorizing Provider  acetaminophen (TYLENOL) 500 MG tablet Take 1,000 mg by mouth as needed for mild pain (pain score 1-3) or moderate pain (pain score 4-6).   Yes [provider]  albuterol (VENTOLIN HFA) 108 (90 Base) MCG/ACT inhaler Inhale 2 puffs into the lungs every 4 (four) hours as needed for wheezing or shortness of breath. 11/24/22  Yes [provider]  calcitRIOL (ROCALTROL) 0.25 MCG  capsule Take 5 capsules (1.25 mcg total) by mouth every Tuesday, Thursday, and Saturday at 6 PM. 07/09/23  Yes Jerald Kief, MD  carvedilol (COREG) 25 MG tablet Take 25 mg by mouth 2 (two) times daily with a meal. 05/15/23  Yes [provider]  Continuous Glucose Sensor (DEXCOM G7 SENSOR) MISC Change sensors every 10 days 09/07/22  Yes Shamleffer, Konrad Dolores, MD  FLUoxetine (PROZAC) 20 MG capsule Take 1 capsule by mouth daily. 03/23/23  Yes [provider]  HYDROcodone-acetaminophen (NORCO/VICODIN) 5-325 MG tablet Take 1 tablet by mouth every 6 (six) hours as needed for moderate pain (pain score 4-6) or severe pain (pain score 7-10). 07/16/23  Yes Durwin Glaze, MD  hydrOXYzine (ATARAX) 25 MG tablet Take 25 mg by mouth in the morning and at bedtime. 05/14/23  Yes [provider]  Insulin Aspart  FlexPen (NOVOLOG) 100 UNIT/ML Inject 4 Units into the skin 3 (three) times daily. 05/10/23 07/20/23 Yes [provider]  lamoTRIgine (LAMICTAL) 200 MG tablet Take 200 mg by mouth daily. 07/11/23  Yes [provider]  LANTUS SOLOSTAR 100 UNIT/ML Solostar Pen Inject 8 Units into the skin daily.   Yes [provider]  LORazepam (ATIVAN) 1 MG tablet Take 1 mg by mouth daily as needed. 07/11/23  Yes [provider]  losartan (COZAAR) 100 MG tablet Take 1 tablet by mouth daily. 03/25/23  Yes [provider]  ondansetron (ZOFRAN-ODT) 4 MG disintegrating tablet Take 1 tablet (4 mg total) by mouth every 8 (eight) hours as needed for nausea or vomiting. 06/23/23  Yes Laurence Spates, MD  polyethylene glycol (MIRALAX / GLYCOLAX) 17 g packet Take 17 g by mouth daily as needed for mild constipation. 12/07/22  Yes [provider]  rosuvastatin (CRESTOR) 40 MG tablet Take 40 mg by mouth daily. 05/22/22  Yes [provider]  sevelamer carbonate (RENVELA) 800 MG tablet Take 2,400 mg by mouth 3 (three) times daily with meals.   Yes [provider]  SUMAtriptan (IMITREX) 50 MG tablet Take 50 mg by mouth every 2 (two) hours as needed for migraine or headache. 06/20/22  Yes [provider]    Discontinued Meds:   Medications Discontinued During This Encounter  Medication Reason   ondansetron (ZOFRAN) tablet 4 mg    ondansetron (ZOFRAN) injection 4 mg    insulin aspart (novoLOG) injection 0-5 Units    insulin aspart (novoLOG) injection 0-15 Units    oxyCODONE (Oxy IR/ROXICODONE) immediate release tablet 5 mg    insulin aspart (novoLOG) injection 0-15 Units    insulin aspart (novoLOG) injection 0-6 Units    dextrose 10 % infusion     Social History:  reports that she has never smoked. She has never been exposed to tobacco smoke. She has never used smokeless tobacco. She reports that she does not currently use alcohol. She reports that she does not use drugs.  Family History:   Family History  Problem Relation Age of Onset   Asthma Mother    Carpal tunnel syndrome Mother    Gout Father    Diabetes Paternal Grandmother    Anesthesia problems Neg Hx     Blood pressure (!) 150/90, pulse 71, temperature 97.6 F (36.4 C), temperature source Oral, resp. rate 16, height 5\' 3"  (1.6 m), weight 59 kg, SpO2 96%, unknown if currently breastfeeding. Physical Exam: General: Well appearing woman, NAD. Room air Heart: RRR; no murmur Lungs: CTAB Extremities: no LE edema Dialysis Access: R AVG + t/b     Ethelene Hal, MD 07/21/2023, 7:51 AM

## 2023-07-21 NOTE — Progress Notes (Addendum)
 [facility] night cross cover note:   I was notified by RN of the patient's most recent CBG result of 470.  Last evening, she had issues with refractory hypoglycemia, so will be initially conservative with dosing of insulin.  For now, I have ordered NovoLog 5 units subcu x 1 dose now, with plan to recheck CBG in about 1 hour.   Update: cbg improving, now down to 402. I ordered an additional 5 units of sq novolog x 1 dose now.     Newton Pigg, DO Hospitalist

## 2023-07-21 NOTE — Progress Notes (Signed)
 Subjective: Having post prandial RLQ pain  Objective: Vital signs in last 24 hours: Temp:  [97.6 F (36.4 C)-98.4 F (36.9 C)] 97.6 F (36.4 C) (03/23 0456) Pulse Rate:  [69-77] 71 (03/23 0456) Resp:  [16-19] 16 (03/23 0456) BP: (138-150)/(88-95) 150/90 (03/23 0456) SpO2:  [95 %-99 %] 96 % (03/23 0456)   Intake/Output from previous day: No intake/output data recorded. Intake/Output this shift: No intake/output data recorded.   General appearance: cooperative GI: soft, nontender  Incision: no significant drainage  Lab Results:  Recent Labs    07/20/23 0347 07/21/23 0615  WBC 10.3 7.9  HGB 11.6* 11.2*  HCT 38.2 35.8*  PLT 234 203   BMET Recent Labs    07/20/23 0347 07/21/23 0615  NA 138 131*  K 3.9 4.9  CL 105 97*  CO2 25 20*  GLUCOSE 135* 443*  BUN 38* 47*  CREATININE 8.96* 9.36*  CALCIUM 8.0* 7.7*   PT/INR Recent Labs    07/20/23 0347  LABPROT 17.5*  INR 1.4*   ABG No results for input(s): "PHART", "HCO3" in the last 72 hours.  Invalid input(s): "PCO2", "PO2"  MEDS, Scheduled  calcitRIOL  1.25 mcg Oral Q T,Th,Sat-1800   carvedilol  25 mg Oral BID WC   Chlorhexidine Gluconate Cloth  6 each Topical Q0600   FLUoxetine  20 mg Oral Daily   heparin  5,000 Units Subcutaneous Q8H   insulin aspart  0-5 Units Subcutaneous QHS   insulin aspart  0-9 Units Subcutaneous TID WC   insulin glargine  8 Units Subcutaneous Daily   losartan  100 mg Oral Daily   rosuvastatin  40 mg Oral Daily   sevelamer carbonate  2,400 mg Oral TID WC    Studies/Results: CT ABDOMEN PELVIS W CONTRAST Result Date: 07/19/2023 CLINICAL DATA:  Abdominal pain following cholecystectomy EXAM: CT ABDOMEN AND PELVIS WITH CONTRAST TECHNIQUE: Multidetector CT imaging of the abdomen and pelvis was performed using the standard protocol following bolus administration of intravenous contrast. RADIATION DOSE REDUCTION: This exam was performed according to the departmental dose-optimization  program which includes automated exposure control, adjustment of the mA and/or kV according to patient size and/or use of iterative reconstruction technique. CONTRAST:  75mL OMNIPAQUE IOHEXOL 350 MG/ML SOLN COMPARISON:  07/16/2023 FINDINGS: Lower chest: No acute abnormality. Hepatobiliary: Gallbladder has been surgically removed. The liver is somewhat heterogeneous in enhancement. Delayed images demonstrate some mild periportal edema which may be related to volume overload given the patient's history of missed dialysis sessions. Minimal fluid is noted within the gallbladder fossa although no discrete abscess is identified. The overall appearance is similar to that seen on prior exams. Pancreas: Unremarkable. No pancreatic ductal dilatation or surrounding inflammatory changes. Spleen: Normal in size without focal abnormality. Adrenals/Urinary Tract: Adrenal glands are within normal limits. Kidneys demonstrate a normal enhancement pattern. No renal calculi or obstructive changes are noted. The bladder is decompressed. Stomach/Bowel: No obstructive or inflammatory changes of the colon are seen. The appendix is within normal limits. Small bowel and stomach are within normal limits. No true obstructive changes are seen. Vascular/Lymphatic: Aortic atherosclerosis. No enlarged abdominal or pelvic lymph nodes. Reproductive: Uterus and bilateral adnexa are unremarkable. Other: Free fluid is again noted within the pelvis as well as surrounding the liver and spleen overall similar to that seen on prior exam. Musculoskeletal: No acute or significant osseous findings. IMPRESSION: Stable postoperative fluid collection within the gallbladder fossa. Stable ascites as described. Mild periportal edema which may be related to volume overload. Electronically  Signed   By: Alcide Clever M.D.   On: 07/19/2023 21:26    Assessment: s/p  Patient Active Problem List   Diagnosis Date Noted   Elevated LFTs 07/20/2023   Cholecystitis  06/30/2023   Prolonged QT interval 06/30/2023   Bipolar disorder (HCC) 06/30/2023   Hyperglycemic crisis due to Type 1 diabetes mellitus (HCC) 05/20/2023   HFrEF (heart failure with reduced ejection fraction) (HCC) 05/20/2023   Chronic combined systolic and diastolic heart failure (HCC) 05/20/2023   Generalized pain 05/20/2023   Hypoglycemia 09/08/2022   Dilated cardiomyopathy (HCC) 09/08/2022   Seizures (HCC) 09/08/2022   ESRD on hemodialysis (HCC) 09/08/2022   Altered mental status 09/07/2022   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 difficile diarrhea 11/15/2021   SIRS (systemic inflammatory response syndrome) (HCC) 11/06/2021   LGSIL on Pap smear of cervix 09/24/2021   Stage 3b chronic kidney disease (HCC) 08/14/2021   DKA (diabetic ketoacidosis) (HCC) 07/26/2021   Essential hypertension 07/26/2021   Acute renal failure superimposed on stage 3b chronic kidney disease (HCC) 06/20/2021   N&V (nausea and vomiting) 06/20/2021   AKI (acute kidney injury) (HCC) 06/20/2021   Type 1 diabetes mellitus with chronic kidney disease on chronic dialysis (HCC) 05/26/2021   Dyslipidemia 05/26/2021   Anasarca 04/16/2021   Mild protein malnutrition (HCC) 03/07/2021   DKA, type 1 (HCC) 12/08/2020   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 diabetes mellitus (HCC) 09/17/2020   Type 1 diabetes mellitus (HCC) 02/08/2020   Systolic ejection murmur 08/03/2019   Type 1 diabetes mellitus with hyperglycemia (HCC) 07/24/2019   Diabetic retinopathy (HCC) 07/02/2019   Proteinuria due to type 1 diabetes mellitus (HCC) 05/21/2019   Diarrhea 05/31/2016   Diabetic  neuropathy (HCC) 01/16/2016   Preop cardiovascular exam 08/24/2013   Pseudohyponatremia 08/04/2012   Hypokalemia 05/07/2012   Lactic acidosis 02/19/2012   Leukocytosis 02/19/2012   Normocytic anemia 02/19/2012   Hyperkalemia 02/19/2012   Stable proliferative diabetic retinopathy associated with type 1 diabetes mellitus (HCC) 10/13/1997      Plan: Cont diet as tolerated today T bili up slightly today, remaining LFTs stable, recheck in AM If elevated again tom, pt may need MRCP or HIDA   LOS: 1 day     .Vanita Panda, MD [facility], [place]    07/21/2023 9:03 AM

## 2023-07-21 NOTE — Plan of Care (Signed)
  Problem: Fluid Volume: Goal: Ability to maintain a balanced intake and output will improve Outcome: Progressing   Problem: Skin Integrity: Goal: Risk for impaired skin integrity will decrease Outcome: Progressing   Problem: Tissue Perfusion: Goal: Adequacy of tissue perfusion will improve Outcome: Progressing   Problem: Nutritional: Goal: Maintenance of adequate nutrition will improve Outcome: Progressing   Problem: Metabolic: Goal: Ability to maintain appropriate glucose levels will improve Outcome: Progressing

## 2023-07-21 NOTE — Progress Notes (Signed)
 [facility] night cross cover note:   The patient has been tolerating a clear liquid diet throughout night shift, without any report of associated abdominal pain nor any associated nausea/vomiting, and she is requesting that her diet be advanced.  Brief chart review, including review of most recent progress notes, general surgery was amenable to advancing diet as tolerated.   As the patient appears to be tolerating a clear liquid diet well, will advance at this time to a full renal diet.    Newton Pigg, DO Hospitalist

## 2023-07-22 DIAGNOSIS — R7989 Other specified abnormal findings of blood chemistry: Secondary | ICD-10-CM | POA: Diagnosis not present

## 2023-07-22 LAB — COMPREHENSIVE METABOLIC PANEL
ALT: 132 U/L — ABNORMAL HIGH (ref 0–44)
AST: 79 U/L — ABNORMAL HIGH (ref 15–41)
Albumin: 3.4 g/dL — ABNORMAL LOW (ref 3.5–5.0)
Alkaline Phosphatase: 422 U/L — ABNORMAL HIGH (ref 38–126)
Anion gap: 20 — ABNORMAL HIGH (ref 5–15)
BUN: 29 mg/dL — ABNORMAL HIGH (ref 6–20)
CO2: 20 mmol/L — ABNORMAL LOW (ref 22–32)
Calcium: 8.9 mg/dL (ref 8.9–10.3)
Chloride: 95 mmol/L — ABNORMAL LOW (ref 98–111)
Creatinine, Ser: 5.63 mg/dL — ABNORMAL HIGH (ref 0.44–1.00)
GFR, Estimated: 10 mL/min — ABNORMAL LOW (ref >60)
Glucose, Bld: 352 mg/dL — ABNORMAL HIGH (ref 70–99)
Potassium: 4.2 mmol/L (ref 3.5–5.1)
Sodium: 135 mmol/L (ref 135–145)
Total Bilirubin: 1.5 mg/dL — ABNORMAL HIGH (ref 0.0–1.2)
Total Protein: 6 g/dL — ABNORMAL LOW (ref 6.5–8.1)

## 2023-07-22 LAB — GLUCOSE, CAPILLARY
Glucose-Capillary: 385 mg/dL — ABNORMAL HIGH (ref 70–99)
Glucose-Capillary: 317 mg/dL — ABNORMAL HIGH (ref 70–99)
Glucose-Capillary: 405 mg/dL — ABNORMAL HIGH (ref 70–99)
Glucose-Capillary: 353 mg/dL — ABNORMAL HIGH (ref 70–99)
Glucose-Capillary: 389 mg/dL — ABNORMAL HIGH (ref 70–99)

## 2023-07-22 MED ORDER — LORAZEPAM 2 MG/ML IJ SOLN
1.0000 mg | INTRAMUSCULAR | Status: DC | PRN
  Administered 2023-07-23: 1 mg via INTRAVENOUS
  Filled 2023-07-22: qty 1

## 2023-07-22 MED ORDER — INSULIN GLARGINE-YFGN 100 UNIT/ML ~~LOC~~ SOLN
8.0000 [IU] | Freq: Every day | SUBCUTANEOUS | Status: DC
  Administered 2023-07-23: 8 [IU] via SUBCUTANEOUS
  Filled 2023-07-22: qty 0.08

## 2023-07-22 NOTE — Plan of Care (Signed)

## 2023-07-22 NOTE — Progress Notes (Signed)
 Subjective: Still having post-prandial pain but is tolerating her meals and having formed stool. LFTs pending this morning.   Objective: Vital signs in last 24 hours: Temp:  [97.6 F (36.4 C)-98.3 F (36.8 C)] 98.3 F (36.8 C) (03/24 0603) Pulse Rate:  [67-79] 79 (03/24 0603) Resp:  [9-24] 14 (03/24 0603) BP: (128-177)/(86-99) 170/91 (03/24 0603) SpO2:  [91 %-100 %] 97 % (03/24 0603)   Intake/Output from previous day: 03/23 0701 - 03/24 0700 In: 0  Out: 1100  Intake/Output this shift: No intake/output data recorded.   General appearance: cooperative GI: soft, nontender  Incision: no significant drainage  Lab Results:  Recent Labs    07/20/23 0347 07/21/23 0615  WBC 10.3 7.9  HGB 11.6* 11.2*  HCT 38.2 35.8*  PLT 234 203   BMET Recent Labs    07/20/23 0347 07/21/23 0615 07/21/23 1726  NA 138 131*  --   K 3.9 4.9  --   CL 105 97*  --   CO2 25 20*  --   GLUCOSE 135* 443* 151*  BUN 38* 47*  --   CREATININE 8.96* 9.36*  --   CALCIUM 8.0* 7.7*  --    PT/INR Recent Labs    07/20/23 0347  LABPROT 17.5*  INR 1.4*   ABG No results for input(s): "PHART", "HCO3" in the last 72 hours.  Invalid input(s): "PCO2", "PO2"  MEDS, Scheduled  calcitRIOL  1.25 mcg Oral Q T,Th,Sat-1800   carvedilol  25 mg Oral BID WC   Chlorhexidine Gluconate Cloth  6 each Topical Q0600   FLUoxetine  20 mg Oral Daily   heparin  5,000 Units Subcutaneous Q8H   insulin aspart  0-5 Units Subcutaneous QHS   insulin aspart  0-9 Units Subcutaneous TID WC   insulin glargine  8 Units Subcutaneous Daily   losartan  100 mg Oral Daily   rosuvastatin  40 mg Oral Daily   sevelamer carbonate  2,400 mg Oral TID WC    Studies/Results: No results found.   Assessment: s/p  Patient Active Problem List   Diagnosis Date Noted   Elevated LFTs 07/20/2023   Cholecystitis 06/30/2023   Prolonged QT interval 06/30/2023   Bipolar disorder (HCC) 06/30/2023   Hyperglycemic crisis due to Type 1  diabetes mellitus (HCC) 05/20/2023   HFrEF (heart failure with reduced ejection fraction) (HCC) 05/20/2023   Chronic combined systolic and diastolic heart failure (HCC) 05/20/2023   Generalized pain 05/20/2023   Hypoglycemia 09/08/2022   Dilated cardiomyopathy (HCC) 09/08/2022   Seizures (HCC) 09/08/2022   ESRD on hemodialysis (HCC) 09/08/2022   Altered mental status 09/07/2022   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 difficile diarrhea 11/15/2021   SIRS (systemic inflammatory response syndrome) (HCC) 11/06/2021   LGSIL on Pap smear of cervix 09/24/2021   Stage 3b chronic kidney disease (HCC) 08/14/2021   DKA (diabetic ketoacidosis) (HCC) 07/26/2021   Essential hypertension 07/26/2021   Acute renal failure superimposed on stage 3b chronic kidney disease (HCC) 06/20/2021   N&V (nausea and vomiting) 06/20/2021   AKI (acute kidney injury) (HCC) 06/20/2021   Type 1 diabetes mellitus with chronic  kidney disease on chronic dialysis (HCC) 05/26/2021   Dyslipidemia 05/26/2021   Anasarca 04/16/2021   Mild protein malnutrition (HCC) 03/07/2021   DKA, type 1 (HCC) 12/08/2020   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 diabetes mellitus (HCC) 09/17/2020   Type 1 diabetes mellitus (HCC) 02/08/2020   Systolic ejection murmur 08/03/2019   Type 1 diabetes mellitus with hyperglycemia (HCC) 07/24/2019   Diabetic retinopathy (HCC) 07/02/2019   Proteinuria due to type 1 diabetes mellitus (HCC) 05/21/2019   Diarrhea 05/31/2016   Diabetic neuropathy (HCC) 01/16/2016   Preop cardiovascular exam 08/24/2013   Pseudohyponatremia 08/04/2012   Hypokalemia 05/07/2012    Lactic acidosis 02/19/2012   Leukocytosis 02/19/2012   Normocytic anemia 02/19/2012   Hyperkalemia 02/19/2012   Stable proliferative diabetic retinopathy associated with type 1 diabetes mellitus (HCC) 10/13/1997      Plan: Follow up LFTs this morning Will consider imaging pending above   LOS: 2 days     Melody Haver, MD [facility] [place]    07/22/2023 7:35 AM

## 2023-07-22 NOTE — Progress Notes (Signed)
 Patient ID: Ashley Freeman, female   DOB: Jul 24, 1990, 33 y.o.   MRN: [id number] S:Still with some nausea today. O:BP (!) 159/87 (BP Location: Left Arm)   Pulse 80   Temp 98.1 F (36.7 C) (Oral)   Resp 14   Ht 5\' 3"  (1.6 m)   Wt 59 kg   LMP  (LMP Unknown)   SpO2 95%   BMI 23.03 kg/m   Intake/Output Summary (Last 24 hours) at 07/22/2023 1513 Last data filed at 07/22/2023 0847 Gross per 24 hour  Intake 0 ml  Output 1100 ml  Net -1100 ml   Intake/Output: I/O last 3 completed shifts: In: 0  Out: 1100 [Other:1100]  Intake/Output this shift:  No intake/output data recorded. Weight change:  Gen:NAD CVS:RRR Resp:CTA Abd: +BS, soft, NT/ND Ext: no edema, RUE AVG+T/B  Recent Labs  Lab 07/16/23 0008 07/16/23 2055 07/19/23 1822 07/19/23 1828 07/20/23 0347 07/21/23 0615 07/21/23 1726 07/22/23 1045  NA 137 138 135 135 138 131*  --  135  K 3.6 3.9 4.7 4.7 3.9 4.9  --  4.2  CL 100 100 98 103 105 97*  --  95*  CO2 23 22 22   --  25 20*  --  20*  GLUCOSE 169* 145* 489* 487* 135* 443* 151* 352*  BUN 26* 29* 38* 45* 38* 47*  --  29*  CREATININE 6.55* 7.54* 8.52* 9.00* 8.96* 9.36*  --  5.63*  ALBUMIN 2.9* 3.4* 3.5  --  3.1* 3.0*  --  3.4*  CALCIUM 7.7* 8.6* 8.6*  --  8.0* 7.7*  --  8.9  PHOS  --   --   --   --   --  5.7*  --   --   AST 27 145* 253*  --  298* 302*  --  79*  ALT 31 64* 156*  --  170* 171*  --  132*   Liver Function Tests: Recent Labs  Lab 07/20/23 0347 07/21/23 0615 07/22/23 1045  AST 298* 302* 79*  ALT 170* 171* 132*  ALKPHOS 409* 437* 422*  BILITOT 1.0 1.3* 1.5*  PROT 5.6* 5.4* 6.0*  ALBUMIN 3.1* 3.0* 3.4*   Recent Labs  Lab 07/16/23 2055 07/19/23 1822  LIPASE 22 37   No results for input(s): "AMMONIA" in the last 168 hours. CBC: Recent Labs  Lab 07/16/23 0008 07/16/23 2055 07/19/23 1822 07/19/23 1828 07/20/23 0347 07/21/23 0615  WBC 8.2 9.0 8.4  --  10.3 7.9  NEUTROABS 3.5 4.0  --   --  4.7  --   HGB 12.1 12.1 12.1 13.9 11.6*  11.2*  HCT 39.4 39.6 40.1 41.0 38.2 35.8*  MCV 85.3 86.1 87.9  --  86.6 85.2  PLT 316 312 256  --  234 203   Cardiac Enzymes: No results for input(s): "CKTOTAL", "CKMB", "CKMBINDEX", "TROPONINI" in the last 168 hours. CBG: Recent Labs  Lab 07/21/23 1701 07/21/23 1738 07/21/23 2032 07/22/23 0847 07/22/23 1136  GLUCAP 34* 134* 179* 385* 317*    Iron Studies: No results for input(s): "IRON", "TIBC", "TRANSFERRIN", "FERRITIN" in the last 72 hours. Studies/Results: No results found.  calcitRIOL  1.25 mcg Oral Q T,Th,Sat-1800   carvedilol  25 mg Oral BID WC   Chlorhexidine Gluconate Cloth  6 each Topical Q0600   FLUoxetine  20 mg Oral Daily   heparin  5,000 Units Subcutaneous Q8H   insulin aspart  0-5 Units Subcutaneous QHS   insulin aspart  0-9 Units Subcutaneous TID WC  insulin glargine  8 Units Subcutaneous Daily   losartan  100 mg Oral Daily   rosuvastatin  40 mg Oral Daily   sevelamer carbonate  2,400 mg Oral TID WC    BMET    Component Value Date/Time   NA 135 07/22/2023 1045   NA 136 07/13/2021 1553   NA 137 11/12/2013 0423   K 4.2 07/22/2023 1045   K 4.2 11/12/2013 0423   CL 95 (L) 07/22/2023 1045   CL 107 11/12/2013 0423   CO2 20 (L) 07/22/2023 1045   CO2 23 11/12/2013 0423   GLUCOSE 352 (H) 07/22/2023 1045   GLUCOSE 339 (H) 11/12/2013 0423   BUN 29 (H) 07/22/2023 1045   BUN 51 (H) 07/13/2021 1553   BUN 7 11/12/2013 0423   CREATININE 5.63 (H) 07/22/2023 1045   CREATININE 0.59 (L) 11/12/2013 0423   CREATININE 0.51 08/24/2013 0957   CALCIUM 8.9 07/22/2023 1045   CALCIUM 8.2 (L) 11/12/2013 0423   GFRNONAA 10 (L) 07/22/2023 1045   GFRNONAA >60 11/12/2013 0423   GFRNONAA >89 08/24/2013 0957   GFRAA 44 (L) 01/30/2020 1637   GFRAA >60 11/12/2013 0423   GFRAA >89 08/24/2013 0957   CBC    Component Value Date/Time   WBC 7.9 07/21/2023 0615   RBC 4.20 07/21/2023 0615   HGB 11.2 (L) 07/21/2023 0615   HGB 10.0 (L) 09/08/2019 0940   HCT 35.8 (L) 07/21/2023  0615   HCT 30.3 (L) 09/08/2019 0940   PLT 203 07/21/2023 0615   PLT 302 09/08/2019 0940   MCV 85.2 07/21/2023 0615   MCV 87 09/08/2019 0940   MCV 80 11/11/2013 0431   MCH 26.7 07/21/2023 0615   MCHC 31.3 07/21/2023 0615   RDW 18.4 (H) 07/21/2023 0615   RDW 13.5 09/08/2019 0940   RDW 14.8 (H) 11/11/2013 0431   LYMPHSABS 4.4 (H) 07/20/2023 0347   LYMPHSABS 2.0 05/20/2019 1232   LYMPHSABS 3.7 (H) 11/11/2013 0431   MONOABS 0.7 07/20/2023 0347   MONOABS 0.5 11/11/2013 0431   EOSABS 0.5 07/20/2023 0347   EOSABS 0.4 05/20/2019 1232   EOSABS 0.1 11/11/2013 0431   BASOSABS 0.1 07/20/2023 0347   BASOSABS 0.0 05/20/2019 1232   BASOSABS 0.1 11/11/2013 0431    Dialysis Orders [facility].  TTS 3h 350/500 2K/2.5Ca  EDW 57.5 kg AVG 16g  -Tums EX 750 q HD -Cinacalcet 60 mg three times per week -Calcitriol 1.25 mcg three times per week -Heparin 1600 units bolus then 600 units/hr    Assessment/Plan: 1. Abdominal pain with s/p lap chole with LOA. Surgery is advancing diet do not think there is any postoperative complication.  CT imaging stable.  Pain seems to be worse when she eats more.  Limited to small amounts at this current time. 2. ESRD -  HD TTS. Continue on schedule Unfortunately, she was bumped over the weekend and didn't get HD until early this morning.  Will plan a short session tomorrow to get back on schedule.  3.T1DM - per primary  5. HTN/volume-  BP ok. Not to EDW, UF as able.  6. Anemia - Hgb 11.2 - monitor without ESA for now. 7. MBD - Corr Ca ok. Continue binder, VDRA, sensipar. 8. Nutrition - Alb low, continue protein supps.  Irena Cords, MD [facility]

## 2023-07-22 NOTE — Progress Notes (Signed)
 PROGRESS NOTE  Ashley Freeman  DOB: 1990/08/25  PCP: [facility] IEP:[id number]  DOA: 07/19/2023  LOS: 2 days  Hospital Day: 4  Brief narrative: Ashley Freeman is a 33 y.o. female with PMH significant for ESRD HD TTS, chronic systolic CHF with EF 25 to 30%, DM1, prolonged QTc, bipolar disorder On 3/10 she underwent a cholecystectomy without complication.   Since discharge, she continued to have nausea, vomiting, abdominal pain.  3/21, symptoms worsened and hence presented to ED.  In the ED, afebrile hemodynamically stable. LFTs were elevated CT abdomen pelvis showed a postop small fluid collections.  General surgery was consulted Admitted to [facility] Vomited after lunch yesterday. Pending LFTs this morning  Subjective: Patient was seen and examined this morning.   Pleasant young African-American female.   Lying down in bed.  Vomited after lunch yesterday.  States she had pain after dinner last night as well but did not throw up.   Pending labs this morning. May need imaging following that.  Assessment and plan: Elevated LFTs Postprandial abdominal pain Presented with progressive abdomen pain, nausea, vomiting 10 days after cholecystectomy  LFTs elevated.  CT abdomen as above Per general surgery note, no signs of postoperative complication. Total bili slightly up yesterday.  Pending repeat labs today Was tried on clear liquid diet yesterday but vomited after lunch. Workup in progress.  Not ready for discharge Recent Labs  Lab 07/16/23 0008 07/16/23 2055 07/19/23 1822 07/20/23 0347 07/21/23 0615  AST 27 145* 253* 298* 302*  ALT 31 64* 156* 170* 171*  ALKPHOS 253* 283* 418* 409* 437*  BILITOT 0.7 0.7 1.2 1.0 1.3*  BILIDIR  --   --   --  0.2  --   PROT 5.5* 6.5 6.3* 5.6* 5.4*  ALBUMIN 2.9* 3.4* 3.5 3.1* 3.0*  INR  --   --   --  1.4*  --   LIPASE  --  22 37  --   --   PLT 316 312 256 234 203   ESRD HD TTS Nephrology  following.   Chronic systolic CHF  essential hypertension Last echo 3/3 with EF 25 to 30% PTA meds- Coreg 25 mg twice daily, losartan 100 mg daily Currently continued on both  Type 1 diabetes mellitus uncontrolled Hyperglycemia/hypoglycemia A1c 10.6 PTA meds-Lantus 8 units daily and Premeal sliding some insulin Noted blood sugar trend.  Very brittle diabetes with wild fluctuations in his difficulty in titrating insulin.  Currently on Lantus at home dose of 8 units with SSI and Accu-Cheks. Recent Labs  Lab 07/21/23 1106 07/21/23 1701 07/21/23 1738 07/21/23 2032 07/22/23 0847  GLUCAP 253* 34* 134* 179* 385*   H/o prolonged Qtc EKG 3/23 with QTc 492 ms Watch out electrolytes Recent Labs  Lab 07/16/23 2055 07/19/23 1822 07/19/23 1828 07/20/23 0347 07/21/23 0615  K 3.9 4.7 4.7 3.9 4.9  MG  --   --   --   --  2.9*  PHOS  --   --   --   --  5.7*   HLD Crestor  Bipolar disorder Prozac to continue   Mobility: Encourage ambulation  Goals of care   Code Status: Full Code     DVT prophylaxis:  heparin injection 5,000 Units Start: 07/20/23 1400   Antimicrobials: None Fluid: None Consultants: General surgery, nephrology Family Communication: None at bedside  Status: Inpatient Level of care:  Med-Surg   Patient is from: Home Needs to continue in-hospital care: Repeat labs pending this morning Anticipated  d/c to: Workup in progress.  Home eventually   Diet:  Diet Order             Diet renal with fluid restriction Fluid restriction: Other (see comments); Room service appropriate? Yes; Fluid consistency: Thin  Diet effective now                   Scheduled Meds:  calcitRIOL  1.25 mcg Oral Q T,Th,Sat-1800   carvedilol  25 mg Oral BID WC   Chlorhexidine Gluconate Cloth  6 each Topical Q0600   FLUoxetine  20 mg Oral Daily   heparin  5,000 Units Subcutaneous Q8H   insulin aspart  0-5 Units Subcutaneous QHS   insulin aspart  0-9 Units Subcutaneous TID  WC   insulin glargine  8 Units Subcutaneous Daily   losartan  100 mg Oral Daily   rosuvastatin  40 mg Oral Daily   sevelamer carbonate  2,400 mg Oral TID WC    PRN meds: dextrose, HYDROcodone-acetaminophen, prochlorperazine, senna-docusate   Infusions:    Antimicrobials: Anti-infectives (From admission, onward)    None       Objective: Vitals:   07/22/23 0603 07/22/23 0847  BP: (!) 170/91 (!) 159/87  Pulse: 79 80  Resp: 14   Temp: 98.3 F (36.8 C) 98.1 F (36.7 C)  SpO2: 97% 95%    Intake/Output Summary (Last 24 hours) at 07/22/2023 1111 Last data filed at 07/22/2023 0847 Gross per 24 hour  Intake 0 ml  Output 1100 ml  Net -1100 ml   Filed Weights   07/19/23 1715  Weight: 59 kg   Weight change:  Body mass index is 23.03 kg/m.   Physical Exam: General exam: Pleasant, young African-American female Skin: No rashes, lesions or ulcers. HEENT: Atraumatic, normocephalic, no obvious bleeding Lungs: Clear to auscultation bilaterally,  CVS: S1, S2, no murmur,   GI/Abd: Soft, mild RUQ tenderness, nondistended, bowel sound present,   CNS: Alert, awake, oriented x 3 Psychiatry: Mood appropriate,  Extremities: Trace bilateral pedal edema, no calf tenderness  Data Review: I have personally reviewed the laboratory data and studies available.  F/u labs  Unresulted Labs (From admission, onward)     Start     Ordered   07/22/23 0500  Comprehensive metabolic panel  Tomorrow morning,   R        07/21/23 0908   07/19/23 1945  Hepatitis B surface antibody,quantitative  (New Admission Hemo Labs (Hepatitis B))  Once,   URGENT        07/19/23 1944   07/19/23 1730  Urinalysis, Routine w reflex microscopic -Urine, Clean Catch  Once,   URGENT       Question:  Specimen Source  Answer:  Urine, Clean Catch   07/19/23 1729            Total time spent in review of labs and imaging, patient evaluation, formulation of plan, documentation and communication with family: 45  minutes  Signed, Lorin Glass, MD [facility] 07/22/2023

## 2023-07-23 ENCOUNTER — Inpatient Hospital Stay (HOSPITAL_COMMUNITY)

## 2023-07-23 DIAGNOSIS — R7989 Other specified abnormal findings of blood chemistry: Secondary | ICD-10-CM | POA: Diagnosis not present

## 2023-07-23 LAB — COMPREHENSIVE METABOLIC PANEL
ALT: 101 U/L — ABNORMAL HIGH (ref 0–44)
AST: 47 U/L — ABNORMAL HIGH (ref 15–41)
Albumin: 3.2 g/dL — ABNORMAL LOW (ref 3.5–5.0)
Alkaline Phosphatase: 380 U/L — ABNORMAL HIGH (ref 38–126)
Anion gap: 13 (ref 5–15)
BUN: 44 mg/dL — ABNORMAL HIGH (ref 6–20)
CO2: 24 mmol/L (ref 22–32)
Calcium: 8.6 mg/dL — ABNORMAL LOW (ref 8.9–10.3)
Chloride: 95 mmol/L — ABNORMAL LOW (ref 98–111)
Creatinine, Ser: 6.7 mg/dL — ABNORMAL HIGH (ref 0.44–1.00)
GFR, Estimated: 8 mL/min — ABNORMAL LOW (ref >60)
Glucose, Bld: 417 mg/dL — ABNORMAL HIGH (ref 70–99)
Potassium: 4.6 mmol/L (ref 3.5–5.1)
Sodium: 132 mmol/L — ABNORMAL LOW (ref 135–145)
Total Bilirubin: 0.8 mg/dL (ref 0.0–1.2)
Total Protein: 5.7 g/dL — ABNORMAL LOW (ref 6.5–8.1)

## 2023-07-23 LAB — GLUCOSE, CAPILLARY
Glucose-Capillary: 382 mg/dL — ABNORMAL HIGH (ref 70–99)
Glucose-Capillary: 403 mg/dL — ABNORMAL HIGH (ref 70–99)
Glucose-Capillary: 186 mg/dL — ABNORMAL HIGH (ref 70–99)

## 2023-07-23 LAB — HEPATITIS B SURFACE ANTIBODY, QUANTITATIVE: Hep B S AB Quant (Post): 4.3 m[IU]/mL — ABNORMAL LOW

## 2023-07-23 MED ORDER — INSULIN GLARGINE-YFGN 100 UNIT/ML ~~LOC~~ SOLN: 10.0000 [IU] | Freq: Every day | SUBCUTANEOUS | Status: DC

## 2023-07-23 MED ORDER — FENTANYL CITRATE PF 50 MCG/ML IJ SOSY
12.5000 ug | PREFILLED_SYRINGE | Freq: Four times a day (QID) | INTRAMUSCULAR | Status: DC | PRN
  Administered 2023-07-23: 12.5 ug via INTRAVENOUS
  Filled 2023-07-23: qty 1

## 2023-07-23 MED ORDER — HYDROCODONE-ACETAMINOPHEN 5-325 MG PO TABS: 1.0000 | ORAL_TABLET | Freq: Four times a day (QID) | ORAL | Status: DC | PRN

## 2023-07-23 MED ORDER — GADOBUTROL 1 MMOL/ML IV SOLN
6.0000 mL | Freq: Once | INTRAVENOUS | Status: AC | PRN
  Administered 2023-07-23: 6 mL via INTRAVENOUS

## 2023-07-23 MED ORDER — HYDROCODONE-ACETAMINOPHEN 5-325 MG PO TABS: 1.0000 | ORAL_TABLET | Freq: Four times a day (QID) | ORAL | 0 refills | Status: DC | PRN

## 2023-07-23 NOTE — Discharge Summary (Signed)
 Physician Discharge Summary  Berdina Cheever [place] LOV:[id number] DOB: 29-Mar-1991 DOA: 07/19/2023  PCP: Karie Georges Pap, MD  Admit date: 07/19/2023 Discharge date: 07/23/2023  Admitted From: Home Discharge disposition: Home  Recommendations at discharge:  Follow-up with surgery as outpatient    Brief narrative: Ashley Freeman is a 33 y.o. female with PMH significant for ESRD HD TTS, chronic systolic CHF with EF 25 to 30%, DM1, prolonged QTc, bipolar disorder On 3/10 she underwent a cholecystectomy without complication.   Since discharge, she continued to have nausea, vomiting, abdominal pain.  3/21, symptoms worsened and hence presented to ED.  In the ED, afebrile hemodynamically stable. LFTs were elevated CT abdomen pelvis showed a postop small fluid collection. General surgery was consulted Admitted to [facility]  Subjective: Patient was seen and examined this morning.   Pleasant young African-American female.   MRCP done and did not show signs of postoperative complication. Cleared in the afternoon by general surgery for discharge.  Hospital course: Elevated LFTs Postprandial abdominal pain Presented with progressive abdominal pain, nausea, vomiting 10 days after cholecystectomy  LFTs were elevated.  CT abdomen was concerning for postop small fluid collection Per general surgery note, no signs of postoperative complication. Patient continued to have significant postprandial pain and some as she was on 3/25.  Did not see any signs of postoperative complication. Pain controlled with pain meds.  Able to tolerate diet. Okay to discharge per general surgery.  Recommended to avoid exacerbating foods At patient's request prescription for hydrocodone was sent to her pharmacy Recent Labs  Lab 07/19/23 1822 07/20/23 0347 07/21/23 0615 07/22/23 1045 07/23/23 0755  AST 253* 298* 302* 79* 47*  ALT 156* 170* 171* 132* 101*  ALKPHOS 418* 409* 437* 422* 380*   BILITOT 1.2 1.0 1.3* 1.5* 0.8  BILIDIR  --  0.2  --   --   --   PROT 6.3* 5.6* 5.4* 6.0* 5.7*  ALBUMIN 3.5 3.1* 3.0* 3.4* 3.2*  INR  --  1.4*  --   --   --   LIPASE 37  --   --   --   --   PLT 256 234 203  --   --    ESRD HD TTS Nephrology was following.   Chronic systolic CHF  essential hypertension Last echo 3/3 with EF 25 to 30% PTA meds- Coreg 25 mg twice daily, losartan 100 mg daily Currently continued on both  Type 1 diabetes mellitus uncontrolled Hyperglycemia/hypoglycemia A1c 10.6 PTA meds-Lantus 8 units daily and Premeal sliding some insulin Patient has very brittle diabetes and needs to monitor blood sugar at home diligently. Recent Labs  Lab 07/22/23 1914 07/22/23 2025 07/23/23 0740 07/23/23 1130 07/23/23 1621  GLUCAP 353* 389* 382* 403* 186*   H/o prolonged Qtc EKG 3/23 with QTc 492 ms Watch out electrolytes Recent Labs  Lab 07/19/23 1828 07/20/23 0347 07/21/23 0615 07/22/23 1045 07/23/23 0755  K 4.7 3.9 4.9 4.2 4.6  MG  --   --  2.9*  --   --   PHOS  --   --  5.7*  --   --    HLD Crestor  Bipolar disorder Prozac to continue   Goals of care   Code Status: Prior   Diet:  Diet Order             Diet general                   Nutritional status:  Body mass index  is 23.03 kg/m.       Wounds:  - Incision - 5 Ports Abdomen 1: Umbilicus 2: Superior 3: Right;Medial;Upper 4: Right;Lateral;Upper 5: Right;Distal (Active)  Placement Date/Time: 07/02/23 1351   Location of Ports: Abdomen  Port: 1:  Location Orientation: Umbilicus  Port: 2:  Location Orientation: Superior  Port: 3:  Location Orientation: Right;Medial;Upper  Port: 4:  Location Orientation: Right;Lateral;Upp...    Assessments 07/02/2023  3:18 PM 07/07/2023  8:30 PM  Port 1 Site Assessment Clean;Dry Clean;Dry  Port 1 Margins Attached edges (approximated) --  Port 1 Drainage Amount None --  Port 1 Dressing Type -- Liquid skin adhesive  Port 1 Dressing Status Clean, Dry,  Intact Clean, Dry, Intact  Port 2 Site Assessment Clean;Dry Clean;Dry  Port 2 Margins Attached edges (approximated) --  Port 2 Drainage Amount None --  Port 2 Dressing Type Adhesive strips Liquid skin adhesive  Port 2 Dressing Status Clean, Dry, Intact Clean, Dry, Intact  Port 3 Site Assessment Clean;Dry Clean;Dry  Port 3 Margins Attached edges (approximated) --  Port 3 Drainage Amount None --  Port 3 Dressing Type -- Liquid skin adhesive  Port 3 Dressing Status Clean, Dry, Intact Clean, Dry, Intact  Port 4 Site Assessment Clean;Dry Clean;Dry  Port 4 Margins Attached edges (approximated) --  Port 4 Drainage Amount None --  Port 4 Dressing Type Adhesive strips Liquid skin adhesive  Port 4 Dressing Status Clean, Dry, Intact Clean, Dry, Intact  Port 5 Site Assessment Clean;Dry Clean;Dry  Port 5 Margins Attached edges (approximated) --  Port 5 Drainage Amount None --  Port 5 Dressing Type Adhesive strips Liquid skin adhesive  Port 5 Dressing Status Clean, Dry, Intact Clean, Dry, Intact     No associated orders.    Discharge Exam:   Vitals:   07/22/23 2026 07/23/23 0434 07/23/23 0823 07/23/23 1620  BP: 127/79 120/77 130/76 (!) 149/93  Pulse: 77 78 76 78  Resp: 18 18 18 18   Temp: 98.1 F (36.7 C) 98.7 F (37.1 C) 98 F (36.7 C) (!) 97.5 F (36.4 C)  TempSrc:   Oral Oral  SpO2: 96% 99% 97% 98%  Weight:      Height:        Body mass index is 23.03 kg/m.   General exam: Pleasant, young African-American female Skin: No rashes, lesions or ulcers. HEENT: Atraumatic, normocephalic, no obvious bleeding Lungs: Clear to auscultation bilaterally,  CVS: S1, S2, no murmur,   GI/Abd: Soft, mild RUQ tenderness, nondistended, bowel sound present,   CNS: Alert, awake, oriented x 3 Psychiatry: Mood appropriate,  Extremities: Trace bilateral pedal edema, no calf tenderness  Follow ups:    Follow-up Information     [name], [name], MD Follow up.   Specialty: Family  Medicine Contact information: [address] [place] [address] [phone]                 Discharge Instructions:   Discharge Instructions     Call MD for:  difficulty breathing, headache or visual disturbances   Complete by: As directed    Call MD for:  extreme fatigue   Complete by: As directed    Call MD for:  hives   Complete by: As directed    Call MD for:  persistant dizziness or light-headedness   Complete by: As directed    Call MD for:  persistant nausea and vomiting   Complete by: As directed    Call MD for:  severe uncontrolled pain  Complete by: As directed    Call MD for:  temperature >100.4   Complete by: As directed    Diet general   Complete by: As directed    Discharge instructions   Complete by: As directed    Gradually advance diet as tolerated. F/u with general surgery as an outpatient.    PDMP reviewed this encounter.   Opioid taper instructions: It is important to wean off of your opioid medication as soon as possible. If you do not need pain medication after your surgery it is ok to stop day one. Opioids include: Codeine, Hydrocodone(Norco, Vicodin), Oxycodone(Percocet, oxycontin) and hydromorphone amongst others.  Long term and even short term use of opiods can cause: Increased pain response Dependence Constipation Depression Respiratory depression And more.  Withdrawal symptoms can include Flu like symptoms Nausea, vomiting And more Techniques to manage these symptoms Hydrate well Eat regular healthy meals Stay active Use relaxation techniques(deep breathing, meditating, yoga) Do Not substitute Alcohol to help with tapering If you have been on opioids for less than two weeks and do not have pain than it is ok to stop all together.  Plan to wean off of opioids This plan should start within one week post op of your joint replacement. Maintain the same interval or time between taking each dose and first decrease the dose.   Cut the total daily intake of opioids by one tablet each day Next start to increase the time between doses. The last dose that should be eliminated is the evening dose.        General discharge instructions: Follow with Primary MD [name], [name], MD in 7 days  Please request your PCP  to go over your hospital tests, procedures, radiology results at the follow up. Please get your medicines reviewed and adjusted.  Your PCP may decide to repeat certain labs or tests as needed. Do not drive, operate heavy machinery, perform activities at heights, swimming or participation in water activities or provide baby sitting services if your were admitted for syncope or siezures until you have seen by Primary MD or a Neurologist and advised to do so again. [place] Controlled Substance Reporting System database was reviewed. Do not drive, operate heavy machinery, perform activities at heights, swim, participate in water activities or provide baby-sitting services while on medications for pain, sleep and mood until your outpatient physician has reevaluated you and advised to do so again.  You are strongly recommended to comply with the dose, frequency and duration of prescribed medications. Activity: As tolerated with Full fall precautions use walker/cane & assistance as needed Avoid using any recreational substances like cigarette, tobacco, alcohol, or non-prescribed drug. If you experience worsening of your admission symptoms, develop shortness of breath, life threatening emergency, suicidal or homicidal thoughts you must seek medical attention immediately by calling 911 or calling your MD immediately  if symptoms less severe. You must read complete instructions/literature along with all the possible adverse reactions/side effects for all the medicines you take and that have been prescribed to you. Take any new medicine only after you have completely understood and accepted all the possible adverse  reactions/side effects.  Wear Seat belts while driving. You were cared for by a hospitalist during your hospital stay. If you have any questions about your discharge medications or the care you received while you were in the hospital after you are discharged, you can call the unit and ask to speak with the hospitalist or the covering physician. Once you  are discharged, your primary care physician will handle any further medical issues. Please note that NO REFILLS for any discharge medications will be authorized once you are discharged, as it is imperative that you return to your primary care physician (or establish a relationship with a primary care physician if you do not have one).   Increase activity slowly   Complete by: As directed        Discharge Medications:   Allergies as of 07/23/2023       Reactions   Cephalexin Anaphylaxis   Has gotten ceftriaxone in the past    Morphine Itching   Penicillins Hives, Rash   Benadryl [diphenhydramine] Itching   Dilaudid [hydromorphone] Itching   Doxycycline Itching   Methotrexate Derivatives Rash   Oxycodone Itching        Medication List     TAKE these medications    acetaminophen 500 MG tablet Commonly known as: TYLENOL Take 1,000 mg by mouth as needed for mild pain (pain score 1-3) or moderate pain (pain score 4-6).   albuterol 108 (90 Base) MCG/ACT inhaler Commonly known as: VENTOLIN HFA Inhale 2 puffs into the lungs every 4 (four) hours as needed for wheezing or shortness of breath.   calcitRIOL 0.25 MCG capsule Commonly known as: ROCALTROL Take 5 capsules (1.25 mcg total) by mouth every Tuesday, Thursday, and Saturday at 6 PM.   carvedilol 25 MG tablet Commonly known as: COREG Take 25 mg by mouth 2 (two) times daily with a meal.   Dexcom G7 Sensor Misc Change sensors every 10 days   FLUoxetine 20 MG capsule Commonly known as: PROZAC Take 1 capsule by mouth daily.   HYDROcodone-acetaminophen 5-325 MG  tablet Commonly known as: NORCO/VICODIN Take 1 tablet by mouth every 6 (six) hours as needed for moderate pain (pain score 4-6) or severe pain (pain score 7-10).   hydrOXYzine 25 MG tablet Commonly known as: ATARAX Take 25 mg by mouth in the morning and at bedtime.   Insulin Aspart FlexPen 100 UNIT/ML Commonly known as: NOVOLOG Inject 4 Units into the skin 3 (three) times daily.   lamoTRIgine 200 MG tablet Commonly known as: LAMICTAL Take 200 mg by mouth daily.   Lantus SoloStar 100 UNIT/ML Solostar Pen Generic drug: insulin glargine Inject 8 Units into the skin daily.   LORazepam 1 MG tablet Commonly known as: ATIVAN Take 1 mg by mouth daily as needed.   losartan 100 MG tablet Commonly known as: COZAAR Take 1 tablet by mouth daily.   ondansetron 4 MG disintegrating tablet Commonly known as: ZOFRAN-ODT Take 1 tablet (4 mg total) by mouth every 8 (eight) hours as needed for nausea or vomiting.   polyethylene glycol 17 g packet Commonly known as: MIRALAX / GLYCOLAX Take 17 g by mouth daily as needed for mild constipation.   rosuvastatin 40 MG tablet Commonly known as: CRESTOR Take 40 mg by mouth daily.   sevelamer carbonate 800 MG tablet Commonly known as: RENVELA Take 2,400 mg by mouth 3 (three) times daily with meals.   SUMAtriptan 50 MG tablet Commonly known as: IMITREX Take 50 mg by mouth every 2 (two) hours as needed for migraine or headache.         The results of significant diagnostics from this hospitalization (including imaging, microbiology, ancillary and laboratory) are listed below for reference.    Procedures and Diagnostic Studies:   CT ABDOMEN PELVIS W CONTRAST Result Date: 07/19/2023 CLINICAL DATA:  Abdominal pain following cholecystectomy EXAM: CT ABDOMEN  AND PELVIS WITH CONTRAST TECHNIQUE: Multidetector CT imaging of the abdomen and pelvis was performed using the standard protocol following bolus administration of intravenous contrast.  RADIATION DOSE REDUCTION: This exam was performed according to the departmental dose-optimization program which includes automated exposure control, adjustment of the mA and/or kV according to patient size and/or use of iterative reconstruction technique. CONTRAST:  75mL OMNIPAQUE IOHEXOL 350 MG/ML SOLN COMPARISON:  07/16/2023 FINDINGS: Lower chest: No acute abnormality. Hepatobiliary: Gallbladder has been surgically removed. The liver is somewhat heterogeneous in enhancement. Delayed images demonstrate some mild periportal edema which may be related to volume overload given the patient's history of missed dialysis sessions. Minimal fluid is noted within the gallbladder fossa although no discrete abscess is identified. The overall appearance is similar to that seen on prior exams. Pancreas: Unremarkable. No pancreatic ductal dilatation or surrounding inflammatory changes. Spleen: Normal in size without focal abnormality. Adrenals/Urinary Tract: Adrenal glands are within normal limits. Kidneys demonstrate a normal enhancement pattern. No renal calculi or obstructive changes are noted. The bladder is decompressed. Stomach/Bowel: No obstructive or inflammatory changes of the colon are seen. The appendix is within normal limits. Small bowel and stomach are within normal limits. No true obstructive changes are seen. Vascular/Lymphatic: Aortic atherosclerosis. No enlarged abdominal or pelvic lymph nodes. Reproductive: Uterus and bilateral adnexa are unremarkable. Other: Free fluid is again noted within the pelvis as well as surrounding the liver and spleen overall similar to that seen on prior exam. Musculoskeletal: No acute or significant osseous findings. IMPRESSION: Stable postoperative fluid collection within the gallbladder fossa. Stable ascites as described. Mild periportal edema which may be related to volume overload. Electronically Signed   By: Alcide Clever M.D.   On: 07/19/2023 21:26     Labs:   Basic  Metabolic Panel: Recent Labs  Lab 07/19/23 1822 07/19/23 1828 07/20/23 0347 07/21/23 0615 07/21/23 1726 07/22/23 1045 07/23/23 0755  NA 135 135 138 131*  --  135 132*  K 4.7 4.7 3.9 4.9  --  4.2 4.6  CL 98 103 105 97*  --  95* 95*  CO2 22  --  25 20*  --  20* 24  GLUCOSE 489* 487* 135* 443* 151* 352* 417*  BUN 38* 45* 38* 47*  --  29* 44*  CREATININE 8.52* 9.00* 8.96* 9.36*  --  5.63* 6.70*  CALCIUM 8.6*  --  8.0* 7.7*  --  8.9 8.6*  MG  --   --   --  2.9*  --   --   --   PHOS  --   --   --  5.7*  --   --   --    GFR Estimated Creatinine Clearance: 10.1 mL/min (A) (by C-G formula based on SCr of 6.7 mg/dL (H)). Liver Function Tests: Recent Labs  Lab 07/19/23 1822 07/20/23 0347 07/21/23 0615 07/22/23 1045 07/23/23 0755  AST 253* 298* 302* 79* 47*  ALT 156* 170* 171* 132* 101*  ALKPHOS 418* 409* 437* 422* 380*  BILITOT 1.2 1.0 1.3* 1.5* 0.8  PROT 6.3* 5.6* 5.4* 6.0* 5.7*  ALBUMIN 3.5 3.1* 3.0* 3.4* 3.2*   Recent Labs  Lab 07/19/23 1822  LIPASE 37   No results for input(s): "AMMONIA" in the last 168 hours. Coagulation profile Recent Labs  Lab 07/20/23 0347  INR 1.4*    CBC: Recent Labs  Lab 07/19/23 1822 07/19/23 1828 07/20/23 0347 07/21/23 0615  WBC 8.4  --  10.3 7.9  NEUTROABS  --   --  4.7  --   HGB 12.1 13.9 11.6* 11.2*  HCT 40.1 41.0 38.2 35.8*  MCV 87.9  --  86.6 85.2  PLT 256  --  234 203   Cardiac Enzymes: No results for input(s): "CKTOTAL", "CKMB", "CKMBINDEX", "TROPONINI" in the last 168 hours. BNP: Invalid input(s): "POCBNP" CBG: Recent Labs  Lab 07/22/23 1914 07/22/23 2025 07/23/23 0740 07/23/23 1130 07/23/23 1621  GLUCAP 353* 389* 382* 403* 186*   D-Dimer No results for input(s): "DDIMER" in the last 72 hours. Hgb A1c No results for input(s): "HGBA1C" in the last 72 hours. Lipid Profile No results for input(s): "CHOL", "HDL", "LDLCALC", "TRIG", "CHOLHDL", "LDLDIRECT" in the last 72 hours. Thyroid function studies No  results for input(s): "TSH", "T4TOTAL", "T3FREE", "THYROIDAB" in the last 72 hours.  Invalid input(s): "FREET3" Anemia work up No results for input(s): "VITAMINB12", "FOLATE", "FERRITIN", "TIBC", "IRON", "RETICCTPCT" in the last 72 hours. Microbiology No results found for this or any previous visit (from the past 240 hours).  Time coordinating discharge: 45 minutes  Signed: Mertie Haslem  [facility] 07/24/2023, 5:30 PM

## 2023-07-23 NOTE — TOC CM/SW Note (Signed)
 Transition of Care [facility]) - Inpatient Brief Assessment   Patient Details  Name: Ashley Freeman MRN: [id number] Date of Birth: 1990-12-10  Transition of Care [facility]) CM/SW Contact:    Tom-Johnson, Hershal Coria, RN Phone Number: 07/23/2023, 4:25 PM   Clinical Narrative:  Patient presented to the ED with Abdominal pain, N/V. Admitted with elevated with LFT's. Recently underwent Cholecystectomy on 07/08/23. Has hx of ESRD, on TTS outpatient HD schedule, T1DM, Bipolar disorder, HFrEF.  Gen sx and Nephrology following for inpatient HD.    From home with her mother and daughter. Not employed, on disability. Does not drive, mother transports to and from appointments. Does not have DME's at home.  PCP is [name], [name], MD and uses [facility] on [address].   Patient not Medically ready for discharge.  CM will continue to follow as patient progresses with care towards discharge.              Transition of Care Asessment: Insurance and Status: Insurance coverage has been reviewed Patient has primary care physician: Yes Home environment has been reviewed: Yes Prior level of function:: Independent Prior/Current Home Services: No current home services Social Drivers of Health Review: SDOH reviewed no interventions necessary Readmission risk has been reviewed: Yes Transition of care needs: no transition of care needs at this time

## 2023-07-23 NOTE — Inpatient Diabetes Management (Signed)
 Inpatient Diabetes Program Recommendations  AACE/ADA: New Consensus Statement on Inpatient Glycemic Control (2015)  Target Ranges:  Prepandial:   less than 140 mg/dL      Peak postprandial:   less than 180 mg/dL (1-2 hours)      Critically ill patients:  140 - 180 mg/dL   Lab Results  Component Value Date   GLUCAP 382 (H) 07/23/2023   HGBA1C 10.6 (H) 05/21/2023    Review of Glycemic Control  Latest Reference Range & Units 07/22/23 08:47 07/22/23 11:36 07/22/23 16:41 07/22/23 19:14 07/22/23 20:25 07/23/23 07:40  Glucose-Capillary 70 - 99 mg/dL 161 (H) 096 (H) 045 (H) 353 (H) 389 (H) 382 (H)  (H): Data is abnormally high  Diabetes history: T1DM Outpatient Diabetes medications: Lantus 8 units every day, Novolog 4 units TID Current orders for Inpatient glycemic control: Lantus 8 units every day, Novolog 0-9 units TID and 0-5 units QHS  Inpatient Diabetes Program Recommendations:    Might consider:  Lantus 10 units QAM Novolog 0-6 units TID and 0-5 units at bedtime Novolog 2 units TID with meals if patient consumes at least 50%  Will continue to follow while inpatient.  Thank you, Dulce Sellar, MSN, CDCES Diabetes Coordinator Inpatient Diabetes Program [phone] (team pager from 8a-5p)

## 2023-07-23 NOTE — Progress Notes (Signed)
 PROGRESS NOTE  Ashley Freeman  DOB: 1990/07/19  PCP: [facility] ZOX:[id number]  DOA: 07/19/2023  LOS: 3 days  Hospital Day: 5  Brief narrative: Ashley Freeman is a 33 y.o. female with PMH significant for ESRD HD TTS, chronic systolic CHF with EF 25 to 30%, DM1, prolonged QTc, bipolar disorder On 3/10 she underwent a cholecystectomy without complication.   Since discharge, she continued to have nausea, vomiting, abdominal pain.  3/21, symptoms worsened and hence presented to ED.  In the ED, afebrile hemodynamically stable. LFTs were elevated CT abdomen pelvis showed a postop small fluid collection. General surgery was consulted Admitted to [facility]  Subjective: Patient was seen and examined this morning.   Pleasant young African-American female.   Was not abdominal pain earlier.  Given IV fentanyl Somnolent at the time of my eval.  Opens eyes to command. Labs this morning with improving LFTs and bilirubin MRCP pending  Assessment and plan: Elevated LFTs Postprandial abdominal pain Presented with progressive abdomen pain, nausea, vomiting 10 days after cholecystectomy  LFTs were elevated.  CT abdomen was concerning for postop small fluid collection Per general surgery note, no signs of postoperative complication. Patient significant postprandial pain MRCP done this morning, pending report LFTs downtrending On liquid diet per general surgery Recent Labs  Lab 07/16/23 2055 07/19/23 1822 07/20/23 0347 07/21/23 0615 07/22/23 1045 07/23/23 0755  AST 145* 253* 298* 302* 79* 47*  ALT 64* 156* 170* 171* 132* 101*  ALKPHOS 283* 418* 409* 437* 422* 380*  BILITOT 0.7 1.2 1.0 1.3* 1.5* 0.8  BILIDIR  --   --  0.2  --   --   --   PROT 6.5 6.3* 5.6* 5.4* 6.0* 5.7*  ALBUMIN 3.4* 3.5 3.1* 3.0* 3.4* 3.2*  INR  --   --  1.4*  --   --   --   LIPASE 22 37  --   --   --   --   PLT 312 256 234 203  --   --    ESRD HD TTS Nephrology  following.   Chronic systolic CHF  essential hypertension Last echo 3/3 with EF 25 to 30% PTA meds- Coreg 25 mg twice daily, losartan 100 mg daily Currently continued on both  Type 1 diabetes mellitus uncontrolled Hyperglycemia/hypoglycemia A1c 10.6 PTA meds-Lantus 8 units daily and Premeal sliding some insulin Noted blood sugar trend.  Very brittle diabetes with wild fluctuations in his difficulty in titrating insulin.  Currently on Lantus at home dose of 8 units with SSI and Accu-Cheks. Will increase to Lantus 10 units Recent Labs  Lab 07/22/23 1641 07/22/23 1914 07/22/23 2025 07/23/23 0740 07/23/23 1130  GLUCAP 405* 353* 389* 382* 403*   H/o prolonged Qtc EKG 3/23 with QTc 492 ms Watch out electrolytes Recent Labs  Lab 07/19/23 1828 07/20/23 0347 07/21/23 0615 07/22/23 1045 07/23/23 0755  K 4.7 3.9 4.9 4.2 4.6  MG  --   --  2.9*  --   --   PHOS  --   --  5.7*  --   --    HLD Crestor  Bipolar disorder Prozac to continue   Mobility: Encourage ambulation  Goals of care   Code Status: Full Code     DVT prophylaxis:  heparin injection 5,000 Units Start: 07/20/23 1400   Antimicrobials: None Fluid: None Consultants: General surgery, nephrology Family Communication: None at bedside  Status: Inpatient Level of care:  Med-Surg   Patient is from: Home Needs to  continue in-hospital care: MRCP pending Anticipated d/c to: Workup in progress.  Home eventually.   Diet:  Diet Order             Diet renal with fluid restriction Fluid restriction: Other (see comments); Room service appropriate? Yes; Fluid consistency: Thin  Diet effective now                   Scheduled Meds:  calcitRIOL  1.25 mcg Oral Q T,Th,Sat-1800   carvedilol  25 mg Oral BID WC   Chlorhexidine Gluconate Cloth  6 each Topical Q0600   FLUoxetine  20 mg Oral Daily   heparin  5,000 Units Subcutaneous Q8H   insulin aspart  0-5 Units Subcutaneous QHS   insulin aspart  0-9 Units  Subcutaneous TID WC   insulin glargine-yfgn  8 Units Subcutaneous Daily   losartan  100 mg Oral Daily   rosuvastatin  40 mg Oral Daily   sevelamer carbonate  2,400 mg Oral TID WC    PRN meds: dextrose, fentaNYL (SUBLIMAZE) injection, HYDROcodone-acetaminophen, LORazepam, prochlorperazine, senna-docusate   Infusions:    Antimicrobials: Anti-infectives (From admission, onward)    None       Objective: Vitals:   07/23/23 0434 07/23/23 0823  BP: 120/77 130/76  Pulse: 78 76  Resp: 18 18  Temp: 98.7 F (37.1 C) 98 F (36.7 C)  SpO2: 99% 97%   No intake or output data in the 24 hours ending 07/23/23 1246  Filed Weights   07/19/23 1715  Weight: 59 kg   Weight change:  Body mass index is 23.03 kg/m.   Physical Exam: General exam: Pleasant, young African-American female Skin: No rashes, lesions or ulcers. HEENT: Atraumatic, normocephalic, no obvious bleeding Lungs: Clear to auscultation bilaterally,  CVS: S1, S2, no murmur,   GI/Abd: Soft, mild RUQ tenderness, nondistended, bowel sound present,   CNS: Somnolent.  Opens eyes to command.   Psychiatry: Mood appropriate,  Extremities: Trace bilateral pedal edema, no calf tenderness  Data Review: I have personally reviewed the laboratory data and studies available.  F/u labs  Unresulted Labs (From admission, onward)     Start     Ordered   07/24/23 0500  CBC with Differential/Platelet  Tomorrow morning,   R        07/23/23 0816   07/24/23 0500  Comprehensive metabolic panel  Tomorrow morning,   R        07/23/23 0816   07/19/23 1730  Urinalysis, Routine w reflex microscopic -Urine, Clean Catch  Once,   URGENT       Question:  Specimen Source  Answer:  Urine, Clean Catch   07/19/23 1729   Signed and Held  Renal function panel  Once,   R        Signed and Held   Signed and Held  CBC  Once,   R        Signed and Held            Total time spent in review of labs and imaging, patient evaluation, formulation  of plan, documentation and communication with family: 45 minutes  Signed, Lorin Glass, MD [facility] 07/23/2023
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
## 2023-07-23 NOTE — Progress Notes (Signed)
 Pt receives out-pt HD at [facility] on TTS. Will assist as needed.   Olivia Canter Renal Navigator [phone]

## 2023-07-23 NOTE — Plan of Care (Signed)

## 2023-07-23 NOTE — Progress Notes (Signed)
 Patient refused Heparin. Notified provider.

## 2023-07-23 NOTE — Progress Notes (Addendum)
 Patient Ashley Freeman is 89 notified provider.  Per provider give max allowed insulin dose from the sliding scale.

## 2023-07-23 NOTE — Progress Notes (Signed)
 Patient ID: Ashley Freeman, female   DOB: 05/08/1990, 33 y.o.   MRN: [id number] S: Still with some abdominal pain O:BP 130/76 (BP Location: Left Arm)   Pulse 76   Temp 98 F (36.7 C) (Oral)   Resp 18   Ht 5\' 3"  (1.6 m)   Wt 59 kg   LMP  (LMP Unknown)   SpO2 97%   BMI 23.03 kg/m  No intake or output data in the 24 hours ending 07/23/23 1427 Intake/Output: I/O last 3 completed shifts: In: 0  Out: 1100 [Other:1100]  Intake/Output this shift:  No intake/output data recorded. Weight change:  Gen: NAD CVS: RRR Resp:CTA Abd: +BS, soft, mild tenderness to palpation.  Ext:no edema, RUE AVG +T/B  Recent Labs  Lab 07/16/23 2055 07/19/23 1822 07/19/23 1828 07/20/23 0347 07/21/23 0615 07/21/23 1726 07/22/23 1045 07/23/23 0755  NA 138 135 135 138 131*  --  135 132*  K 3.9 4.7 4.7 3.9 4.9  --  4.2 4.6  CL 100 98 103 105 97*  --  95* 95*  CO2 22 22  --  25 20*  --  20* 24  GLUCOSE 145* 489* 487* 135* 443* 151* 352* 417*  BUN 29* 38* 45* 38* 47*  --  29* 44*  CREATININE 7.54* 8.52* 9.00* 8.96* 9.36*  --  5.63* 6.70*  ALBUMIN 3.4* 3.5  --  3.1* 3.0*  --  3.4* 3.2*  CALCIUM 8.6* 8.6*  --  8.0* 7.7*  --  8.9 8.6*  PHOS  --   --   --   --  5.7*  --   --   --   AST 145* 253*  --  298* 302*  --  79* 47*  ALT 64* 156*  --  170* 171*  --  132* 101*   Liver Function Tests: Recent Labs  Lab 07/21/23 0615 07/22/23 1045 07/23/23 0755  AST 302* 79* 47*  ALT 171* 132* 101*  ALKPHOS 437* 422* 380*  BILITOT 1.3* 1.5* 0.8  PROT 5.4* 6.0* 5.7*  ALBUMIN 3.0* 3.4* 3.2*   Recent Labs  Lab 07/16/23 2055 07/19/23 1822  LIPASE 22 37   No results for input(s): "AMMONIA" in the last 168 hours. CBC: Recent Labs  Lab 07/16/23 2055 07/19/23 1822 07/19/23 1828 07/20/23 0347 07/21/23 0615  WBC 9.0 8.4  --  10.3 7.9  NEUTROABS 4.0  --   --  4.7  --   HGB 12.1 12.1 13.9 11.6* 11.2*  HCT 39.6 40.1 41.0 38.2 35.8*  MCV 86.1 87.9  --  86.6 85.2  PLT 312 256  --  234 203    Cardiac Enzymes: No results for input(s): "CKTOTAL", "CKMB", "CKMBINDEX", "TROPONINI" in the last 168 hours. CBG: Recent Labs  Lab 07/22/23 1641 07/22/23 1914 07/22/23 2025 07/23/23 0740 07/23/23 1130  GLUCAP 405* 353* 389* 382* 403*    Iron Studies: No results for input(s): "IRON", "TIBC", "TRANSFERRIN", "FERRITIN" in the last 72 hours. Studies/Results: MR ABDOMEN MRCP W WO CONTAST Result Date: 07/23/2023 CLINICAL DATA:  Jaundice post cholecystectomy. Abdominal pain with elevated bilirubin and alkaline phosphatase levels. EXAM: MRI ABDOMEN WITHOUT AND WITH CONTRAST (INCLUDING MRCP) TECHNIQUE: Multiplanar multisequence MR imaging of the abdomen was performed both before and after the administration of intravenous contrast. Heavily T2-weighted images of the biliary and pancreatic ducts were obtained, and three-dimensional MRCP images were rendered by post processing. CONTRAST:  6mL GADAVIST GADOBUTROL 1 MMOL/ML IV SOLN COMPARISON:  Abdominopelvic CT 07/19/2023 and 07/16/2023. FINDINGS: Scientist, product/process development  note: Despite efforts by the technologist and patient, moderate to severe motion artifact is present on today's exam and could not be eliminated. This reduces exam sensitivity and specificity. Significant portions of the study are nondiagnostic. Lower chest: Images through the lung bases demonstrate stable cardiomegaly. No significant pleural fluid demonstrated. Hepatobiliary: The liver has a non cirrhotic morphology although is enlarged with diffusely decreased T2 signal and loss of signal on the gradient echo in phase images, most consistent with iron deposition. There is mildly heterogeneous enhancement following contrast, most consistent with chronic passive congestion. No focal lesion or suspicious enhancement identified on this limited examination. There is reflux of contrast into the IVC and hepatic veins. No significant biliary dilatation identified status post cholecystectomy. There is a small  amount of ill-defined fluid in the cholecystectomy bed. No evidence of choledocholithiasis (evaluation limited by motion). Pancreas: Unremarkable. No pancreatic ductal dilatation or surrounding inflammatory changes. Spleen: Normal in size without focal abnormality. Low signal in the spleen consistent with iron deposition. Adrenals/Urinary Tract: Both adrenal glands appear normal. Mild renal cortical thinning bilaterally. No evidence of hydronephrosis or focal renal abnormality. Stomach/Bowel: The stomach appears unremarkable for its degree of distension. No evidence of bowel wall thickening, distention or surrounding inflammatory change. Vascular/Lymphatic: No enlarged lymph nodes or acute vascular findings are identified. No evidence of aneurysm. As above, reflux of contrast into the IVC and hepatic veins. Other: Generalized soft tissue edema with small amount of ascites. No focal fluid collections are identified. Musculoskeletal: No acute or significant osseous findings. IMPRESSION: 1. Significantly limited examination due to motion artifact. 2. 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 No focal hepatic lesion or suspicious enhancement identified. 3. No evidence of biliary dilatation or choledocholithiasis post cholecystectomy. Stable small fluid collection in the cholecystectomy bed, nonspecific. If concern of bile leak, consider further evaluation with nuclear medicine hepatobiliary scan. 4. Cardiomegaly with reflux of contrast into the IVC and hepatic veins, consistent with right heart dysfunction. 5. Generalized soft tissue edema with small amount of ascites, consistent with anasarca. Electronically Signed   By: Carey Bullocks M.D.   On: 07/23/2023 14:09   MR 3D Recon At Scanner Result Date: 07/23/2023 CLINICAL DATA:  Jaundice post cholecystectomy. Abdominal pain with elevated bilirubin and alkaline phosphatase levels. EXAM: MRI ABDOMEN WITHOUT AND WITH CONTRAST (INCLUDING  MRCP) TECHNIQUE: Multiplanar multisequence MR imaging of the abdomen was performed both before and after the administration of intravenous contrast. Heavily T2-weighted images of the biliary and pancreatic ducts were obtained, and three-dimensional MRCP images were rendered by post processing. CONTRAST:  6mL GADAVIST GADOBUTROL 1 MMOL/ML IV SOLN COMPARISON:  Abdominopelvic CT 07/19/2023 and 07/16/2023. FINDINGS: Technical note: Despite efforts by the technologist and patient, moderate to severe motion artifact is present on today's exam and could not be eliminated. This reduces exam sensitivity and specificity. Significant portions of the study are nondiagnostic. Lower chest: Images through the lung bases demonstrate stable cardiomegaly. No significant pleural fluid demonstrated. Hepatobiliary: The liver has a non cirrhotic morphology although is enlarged with diffusely decreased T2 signal and loss of signal on the gradient echo in phase images, most consistent with iron deposition. There is mildly heterogeneous enhancement following contrast, most consistent with chronic passive congestion. No focal lesion or suspicious enhancement identified on this limited examination. There is reflux of contrast into the IVC and hepatic veins. No significant biliary dilatation identified status post cholecystectomy. There is a small amount of ill-defined fluid in the cholecystectomy bed. No evidence of choledocholithiasis (  evaluation limited by motion). Pancreas: Unremarkable. No pancreatic ductal dilatation or surrounding inflammatory changes. Spleen: Normal in size without focal abnormality. Low signal in the spleen consistent with iron deposition. Adrenals/Urinary Tract: Both adrenal glands appear normal. Mild renal cortical thinning bilaterally. No evidence of hydronephrosis or focal renal abnormality. Stomach/Bowel: The stomach appears unremarkable for its degree of distension. No evidence of bowel wall thickening,  distention or surrounding inflammatory change. Vascular/Lymphatic: No enlarged lymph nodes or acute vascular findings are identified. No evidence of aneurysm. As above, reflux of contrast into the IVC and hepatic veins. Other: Generalized soft tissue edema with small amount of ascites. No focal fluid collections are identified. Musculoskeletal: No acute or significant osseous findings. IMPRESSION: 1. Significantly limited examination due to motion artifact. 2. 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 No focal hepatic lesion or suspicious enhancement identified. 3. No evidence of biliary dilatation or choledocholithiasis post cholecystectomy. Stable small fluid collection in the cholecystectomy bed, nonspecific. If concern of bile leak, consider further evaluation with nuclear medicine hepatobiliary scan. 4. Cardiomegaly with reflux of contrast into the IVC and hepatic veins, consistent with right heart dysfunction. 5. Generalized soft tissue edema with small amount of ascites, consistent with anasarca. Electronically Signed   By: Carey Bullocks M.D.   On: 07/23/2023 14:09    calcitRIOL  1.25 mcg Oral Q T,Th,Sat-1800   carvedilol  25 mg Oral BID WC   Chlorhexidine Gluconate Cloth  6 each Topical Q0600   FLUoxetine  20 mg Oral Daily   heparin  5,000 Units Subcutaneous Q8H   insulin aspart  0-5 Units Subcutaneous QHS   insulin aspart  0-9 Units Subcutaneous TID WC   [START ON 07/24/2023] insulin glargine-yfgn  10 Units Subcutaneous Daily   losartan  100 mg Oral Daily   rosuvastatin  40 mg Oral Daily   sevelamer carbonate  2,400 mg Oral TID WC    BMET    Component Value Date/Time   NA 132 (L) 07/23/2023 0755   NA 136 07/13/2021 1553   NA 137 11/12/2013 0423   K 4.6 07/23/2023 0755   K 4.2 11/12/2013 0423   CL 95 (L) 07/23/2023 0755   CL 107 11/12/2013 0423   CO2 24 07/23/2023 0755   CO2 23 11/12/2013 0423   GLUCOSE 417 (H) 07/23/2023 0755   GLUCOSE 339 (H)  11/12/2013 0423   BUN 44 (H) 07/23/2023 0755   BUN 51 (H) 07/13/2021 1553   BUN 7 11/12/2013 0423   CREATININE 6.70 (H) 07/23/2023 0755   CREATININE 0.59 (L) 11/12/2013 0423   CREATININE 0.51 08/24/2013 0957   CALCIUM 8.6 (L) 07/23/2023 0755   CALCIUM 8.2 (L) 11/12/2013 0423   GFRNONAA 8 (L) 07/23/2023 0755   GFRNONAA >60 11/12/2013 0423   GFRNONAA >89 08/24/2013 0957   GFRAA 44 (L) 01/30/2020 1637   GFRAA >60 11/12/2013 0423   GFRAA >89 08/24/2013 0957   CBC    Component Value Date/Time   WBC 7.9 07/21/2023 0615   RBC 4.20 07/21/2023 0615   HGB 11.2 (L) 07/21/2023 0615   HGB 10.0 (L) 09/08/2019 0940   HCT 35.8 (L) 07/21/2023 0615   HCT 30.3 (L) 09/08/2019 0940   PLT 203 07/21/2023 0615   PLT 302 09/08/2019 0940   MCV 85.2 07/21/2023 0615   MCV 87 09/08/2019 0940   MCV 80 11/11/2013 0431   MCH 26.7 07/21/2023 0615   MCHC 31.3 07/21/2023 0615   RDW 18.4 (H) 07/21/2023 0615   RDW  13.5 09/08/2019 0940   RDW 14.8 (H) 11/11/2013 0431   LYMPHSABS 4.4 (H) 07/20/2023 0347   LYMPHSABS 2.0 05/20/2019 1232   LYMPHSABS 3.7 (H) 11/11/2013 0431   MONOABS 0.7 07/20/2023 0347   MONOABS 0.5 11/11/2013 0431   EOSABS 0.5 07/20/2023 0347   EOSABS 0.4 05/20/2019 1232   EOSABS 0.1 11/11/2013 0431   BASOSABS 0.1 07/20/2023 0347   BASOSABS 0.0 05/20/2019 1232   BASOSABS 0.1 11/11/2013 0431    Dialysis Orders [facility].  TTS 3h 350/500 2K/2.5Ca  EDW 57.5 kg AVG 16g  -Tums EX 750 q HD -Cinacalcet 60 mg three times per week -Calcitriol 1.25 mcg three times per week -Heparin 1600 units bolus then 600 units/hr    Assessment/Plan: 1. Abdominal pain with s/p lap chole with LOA. Surgery is advancing diet do not think there is any postoperative complication.  CT imaging stable.  Pain seems to be worse when she eats more.  Limited to small amounts at this current time. 2. ESRD -  HD TTS. Continue on schedule Unfortunately, she was bumped over the weekend and didn't get HD until  early yesterday morning.  Will plan a short session today to get back on schedule.  3.T1DM - per primary  5. HTN/volume-  BP ok. Not to EDW, UF as able.  6. Anemia - Hgb 11.2 - monitor without ESA for now. 7. MBD - Corr Ca ok. Continue binder, VDRA, sensipar. 8. Nutrition - Alb low, continue protein supps.  Irena Cords, MD [employer] [phone]

## 2023-07-24 NOTE — Progress Notes (Signed)
 Late Note Entry- July 24, 2023  Pt was d/c late yesterday afternoon. Contacted [facility] this morning to advise clinic of pt's d/c yesterday and that pt should resume care tomorrow. Will fax d/c summary to clinic for continuation of care once completed.   Olivia Canter Renal Navigator [phone]  Late Note Entry- July 25, 2023 D/C summary faxed to clinic for continuation of care.

## 2023-07-25 ENCOUNTER — Emergency Department (HOSPITAL_COMMUNITY)
Admission: EM | Admit: 2023-07-25 | Discharge: 2023-07-25 | Disposition: A | Discharge status: Home / Self Care | Attending: Emergency Medicine | Admitting: Emergency Medicine

## 2023-07-25 ENCOUNTER — Emergency Department (HOSPITAL_COMMUNITY)

## 2023-07-25 ENCOUNTER — Encounter (HOSPITAL_COMMUNITY): Payer: Self-pay

## 2023-07-25 ENCOUNTER — Other Ambulatory Visit: Payer: Self-pay

## 2023-07-25 DIAGNOSIS — E1022 Type 1 diabetes mellitus with diabetic chronic kidney disease: Secondary | ICD-10-CM | POA: Insufficient documentation

## 2023-07-25 DIAGNOSIS — I509 Heart failure, unspecified: Secondary | ICD-10-CM | POA: Insufficient documentation

## 2023-07-25 DIAGNOSIS — R1084 Generalized abdominal pain: Secondary | ICD-10-CM | POA: Insufficient documentation

## 2023-07-25 DIAGNOSIS — N186 End stage renal disease: Secondary | ICD-10-CM | POA: Insufficient documentation

## 2023-07-25 DIAGNOSIS — E1065 Type 1 diabetes mellitus with hyperglycemia: Secondary | ICD-10-CM | POA: Diagnosis not present

## 2023-07-25 DIAGNOSIS — R059 Cough, unspecified: Secondary | ICD-10-CM | POA: Diagnosis not present

## 2023-07-25 DIAGNOSIS — R739 Hyperglycemia, unspecified: Secondary | ICD-10-CM

## 2023-07-25 LAB — CBC WITH DIFFERENTIAL/PLATELET
Abs Immature Granulocytes: 0.06 10*3/uL (ref 0.00–0.07)
Basophils Absolute: 0 10*3/uL (ref 0.0–0.1)
Basophils Relative: 1 %
Eosinophils Absolute: 0.4 10*3/uL (ref 0.0–0.5)
Eosinophils Relative: 5 %
HCT: 42 % (ref 36.0–46.0)
Hemoglobin: 12.7 g/dL (ref 12.0–15.0)
Immature Granulocytes: 1 %
Lymphocytes Relative: 31 %
Lymphs Abs: 2.6 10*3/uL (ref 0.7–4.0)
MCH: 26.2 pg (ref 26.0–34.0)
MCHC: 30.2 g/dL (ref 30.0–36.0)
MCV: 86.8 fL (ref 80.0–100.0)
Monocytes Absolute: 0.9 10*3/uL (ref 0.1–1.0)
Monocytes Relative: 10 %
Neutro Abs: 4.4 10*3/uL (ref 1.7–7.7)
Neutrophils Relative %: 52 %
Platelets: 214 10*3/uL (ref 150–400)
RBC: 4.84 MIL/uL (ref 3.87–5.11)
RDW: 18.5 % — ABNORMAL HIGH (ref 11.5–15.5)
WBC: 8.4 10*3/uL (ref 4.0–10.5)
nRBC: 0 % (ref 0.0–0.2)

## 2023-07-25 LAB — COMPREHENSIVE METABOLIC PANEL WITH GFR
ALT: 102 U/L — ABNORMAL HIGH (ref 0–44)
AST: 74 U/L — ABNORMAL HIGH (ref 15–41)
Albumin: 3.6 g/dL (ref 3.5–5.0)
Alkaline Phosphatase: 418 U/L — ABNORMAL HIGH (ref 38–126)
Anion gap: 15 (ref 5–15)
BUN: 65 mg/dL — ABNORMAL HIGH (ref 6–20)
CO2: 22 mmol/L (ref 22–32)
Calcium: 9.3 mg/dL (ref 8.9–10.3)
Chloride: 98 mmol/L (ref 98–111)
Creatinine, Ser: 8.26 mg/dL — ABNORMAL HIGH (ref 0.44–1.00)
GFR, Estimated: 6 mL/min — ABNORMAL LOW (ref >60)
Glucose, Bld: 396 mg/dL — ABNORMAL HIGH (ref 70–99)
Potassium: 5 mmol/L (ref 3.5–5.1)
Sodium: 135 mmol/L (ref 135–145)
Total Bilirubin: 1.4 mg/dL — ABNORMAL HIGH (ref 0.0–1.2)
Total Protein: 6.6 g/dL (ref 6.5–8.1)

## 2023-07-25 LAB — TROPONIN I (HIGH SENSITIVITY): Troponin I (High Sensitivity): 25 ng/L — ABNORMAL HIGH (ref <18)

## 2023-07-25 LAB — RESP PANEL BY RT-PCR (RSV, FLU A&B, COVID)  RVPGX2
Influenza A by PCR: NEGATIVE
Influenza B by PCR: NEGATIVE
Resp Syncytial Virus by PCR: NEGATIVE
SARS Coronavirus 2 by RT PCR: NEGATIVE

## 2023-07-25 LAB — HCG, SERUM, QUALITATIVE: Preg, Serum: NEGATIVE

## 2023-07-25 LAB — CBG MONITORING, ED
Glucose-Capillary: 430 mg/dL — ABNORMAL HIGH (ref 70–99)
Glucose-Capillary: 397 mg/dL — ABNORMAL HIGH (ref 70–99)

## 2023-07-25 LAB — BRAIN NATRIURETIC PEPTIDE: B Natriuretic Peptide: >4500 pg/mL — ABNORMAL HIGH (ref 0.0–100.0)

## 2023-07-25 MED ORDER — INSULIN GLARGINE-YFGN 100 UNIT/ML ~~LOC~~ SOLN
8.0000 [IU] | Freq: Once | SUBCUTANEOUS | Status: AC
  Administered 2023-07-25: 8 [IU] via SUBCUTANEOUS
  Filled 2023-07-25: qty 0.08

## 2023-07-25 MED ORDER — FENTANYL CITRATE PF 50 MCG/ML IJ SOSY
50.0000 ug | PREFILLED_SYRINGE | Freq: Once | INTRAMUSCULAR | Status: AC
  Administered 2023-07-25: 50 ug via INTRAVENOUS
  Filled 2023-07-25: qty 1

## 2023-07-25 MED ORDER — DICYCLOMINE HCL 20 MG PO TABS
20.0000 mg | ORAL_TABLET | Freq: Two times a day (BID) | ORAL | 0 refills | Status: DC
Start: 2023-07-25 — End: 2023-08-31

## 2023-07-25 MED ORDER — LORATADINE 10 MG PO TABS
10.0000 mg | ORAL_TABLET | Freq: Two times a day (BID) | ORAL | Status: DC | PRN
  Administered 2023-07-25: 10 mg via ORAL
  Filled 2023-07-25: qty 1

## 2023-07-25 NOTE — Discharge Instructions (Addendum)
 As discussed, your labs are reassuring.  They are better than when you were last in the ER. Follow-up with your primary care provider in the next several days for reevaluation.  I have provided information for GI that you can follow up with for further evaluation of your chronic abdominal pain.  Get help right away if: You cannot stop vomiting. Your pain is only in one part of your belly, like on the right side. You have bloody or black poop, or poop that looks like tar. You have trouble breathing. You have chest pain.

## 2023-07-25 NOTE — ED Provider Notes (Signed)
 [place] EMERGENCY DEPARTMENT AT [facility] Provider Note   CSN: [id number] Arrival date & time: 07/25/23  4098     History  Chief Complaint  Patient presents with   Diarrhea   Abdominal Pain   Hyperglycemia    Ashley Freeman is a 33 y.o. female with a history of end-stage renal disease, type 1 diabetes mellitus, heart failure, bipolar disorder presents the ED today for abdominal pain.  Patient reports diffuse abdominal pain that is been going on for several months now.  She was hospitalized and discharged several days ago for same complaint without improvement of symptoms.  She endorses 6 episodes of diarrhea today, which is new for her.  No nausea, vomiting, dysuria, or fevers.  She is also having some cough and shortness of breath which she states happens when she is "fluid overloaded."  Lastly, she was hyperglycemic per EMS with a CBG of 462.  Patient last ate and took insulin around 6 PM.  No additional complaints or concerns at this time.    Home Medications Prior to Admission medications   Medication Sig Start Date End Date Taking? Authorizing Provider  acetaminophen (TYLENOL) 500 MG tablet Take 1,000 mg by mouth as needed for mild pain (pain score 1-3) or moderate pain (pain score 4-6).    [provider]  albuterol (VENTOLIN HFA) 108 (90 Base) MCG/ACT inhaler Inhale 2 puffs into the lungs every 4 (four) hours as needed for wheezing or shortness of breath. 11/24/22   [provider]  calcitRIOL (ROCALTROL) 0.25 MCG capsule Take 5 capsules (1.25 mcg total) by mouth every Tuesday, Thursday, and Saturday at 6 PM. 07/09/23   Jerald Kief, MD  carvedilol (COREG) 25 MG tablet Take 25 mg by mouth 2 (two) times daily with a meal. 05/15/23   [provider]  Continuous Glucose Sensor (DEXCOM G7 SENSOR) MISC Change sensors every 10 days 09/07/22   Shamleffer, Konrad Dolores, MD  dicyclomine (BENTYL) 20 MG tablet Take 1 tablet (20 mg  total) by mouth 2 (two) times daily for 14 days. 07/25/23 08/08/23 Yes Maxwell Marion, PA-C  FLUoxetine (PROZAC) 20 MG capsule Take 1 capsule by mouth daily. 03/23/23   [provider]  HYDROcodone-acetaminophen (NORCO/VICODIN) 5-325 MG tablet Take 1 tablet by mouth every 6 (six) hours as needed for moderate pain (pain score 4-6) or severe pain (pain score 7-10). 07/23/23   Lorin Glass, MD  hydrOXYzine (ATARAX) 25 MG tablet Take 25 mg by mouth in the morning and at bedtime. 05/14/23   [provider]  Insulin Aspart FlexPen (NOVOLOG) 100 UNIT/ML Inject 4 Units into the skin 3 (three) times daily. 05/10/23 07/20/23  [provider]  lamoTRIgine (LAMICTAL) 200 MG tablet Take 200 mg by mouth daily. 07/11/23   [provider]  LANTUS SOLOSTAR 100 UNIT/ML Solostar Pen Inject 8 Units into the skin daily.    [provider]  LORazepam (ATIVAN) 1 MG tablet Take 1 mg by mouth daily as needed. 07/11/23   [provider]  losartan (COZAAR) 100 MG tablet Take 1 tablet by mouth daily. 03/25/23   [provider]  ondansetron (ZOFRAN-ODT) 4 MG disintegrating tablet Take 1 tablet (4 mg total) by mouth every 8 (eight) hours as needed for nausea or vomiting. 06/23/23   Laurence Spates, MD  polyethylene glycol (MIRALAX / GLYCOLAX) 17 g packet Take 17 g by mouth daily as needed for mild constipation. 12/07/22   [provider]  rosuvastatin (CRESTOR) 40  MG tablet Take 40 mg by mouth daily. 05/22/22   [provider]  sevelamer carbonate (RENVELA) 800 MG tablet Take 2,400 mg by mouth 3 (three) times daily with meals.    [provider]  SUMAtriptan (IMITREX) 50 MG tablet Take 50 mg by mouth every 2 (two) hours as needed for migraine or headache. 06/20/22   [provider]      Allergies    Cephalexin, Morphine, Penicillins, Benadryl [diphenhydramine], Dilaudid [hydromorphone], Doxycycline, Methotrexate derivatives, and Oxycodone     Review of Systems   Review of Systems  Gastrointestinal:  Positive for abdominal pain and diarrhea.  All other systems reviewed and are negative.   Physical Exam Updated Vital Signs BP (!) 168/99   Pulse 84   Temp 98 F (36.7 C)   Resp 20   Ht 5\' 3"  (1.6 m)   Wt 63 kg   LMP  (LMP Unknown)   SpO2 100%   BMI 24.62 kg/m  Physical Exam Vitals and nursing note reviewed.  Constitutional:      Appearance: Normal appearance.  HENT:     Head: Normocephalic and atraumatic.     Mouth/Throat:     Mouth: Mucous membranes are moist.  Eyes:     Conjunctiva/sclera: Conjunctivae normal.     Pupils: Pupils are equal, round, and reactive to light.  Cardiovascular:     Rate and Rhythm: Normal rate and regular rhythm.     Pulses: Normal pulses.     Heart sounds: Normal heart sounds.  Pulmonary:     Effort: Pulmonary effort is normal.     Breath sounds: Normal breath sounds.  Abdominal:     Palpations: Abdomen is soft.     Tenderness: There is abdominal tenderness.     Comments: Diffuse abdominal tenderness to palpation with healing surgical scars present from cholecystectomy on 3/10.  Musculoskeletal:        General: Normal range of motion.     Cervical back: Normal range of motion.  Skin:    General: Skin is warm and dry.     Findings: No rash.  Neurological:     General: No focal deficit present.     Mental Status: She is alert.     Sensory: No sensory deficit.     Motor: No weakness.  Psychiatric:        Mood and Affect: Mood normal.        Behavior: Behavior normal.    ED Results / Procedures / Treatments   Labs (all labs ordered are listed, but only abnormal results are displayed) Labs Reviewed  COMPREHENSIVE METABOLIC PANEL WITH GFR - Abnormal; Notable for the following components:      Result Value   Glucose, Bld 396 (*)    BUN 65 (*)    Creatinine, Ser 8.26 (*)    AST 74 (*)    ALT 102 (*)    Alkaline Phosphatase 418 (*)    Total Bilirubin 1.4 (*)    GFR,  Estimated 6 (*)    All other components within normal limits  CBC WITH DIFFERENTIAL/PLATELET - Abnormal; Notable for the following components:   RDW 18.5 (*)    All other components within normal limits  BRAIN NATRIURETIC PEPTIDE - Abnormal; Notable for the following components:   B Natriuretic Peptide >4,500.0 (*)    All other components within normal limits  CBG MONITORING, ED - Abnormal; Notable for the following components:   Glucose-Capillary 430 (*)    All other  components within normal limits  CBG MONITORING, ED - Abnormal; Notable for the following components:   Glucose-Capillary 397 (*)    All other components within normal limits  TROPONIN I (HIGH SENSITIVITY) - Abnormal; Notable for the following components:   Troponin I (High Sensitivity) 25 (*)    All other components within normal limits  RESP PANEL BY RT-PCR (RSV, FLU A&B, COVID)  RVPGX2  HCG, SERUM, QUALITATIVE    EKG EKG Interpretation Date/Time:  Thursday July 25 2023 03:31:48 EDT Ventricular Rate:  88 PR Interval:  181 QRS Duration:  93 QT Interval:  407 QTC Calculation: 493 R Axis:   25  Text Interpretation: Sinus rhythm Probable left atrial enlargement Consider anterior infarct Nonspecific repol abnormality, lateral leads Confirmed by Marily Memos [phone]) on 07/25/2023 5:44:01 AM  Radiology DG Chest 2 View Result Date: 07/25/2023 CLINICAL DATA:  Shortness of breath. EXAM: CHEST - 2 VIEW COMPARISON:  07/11/2023 FINDINGS: Stable cardiac enlargement. No pleural fluid, interstitial edema or airspace disease. Scar versus platelike atelectasis within the lingula. No pneumothorax identified. Visualized osseous structures are unremarkable. IMPRESSION: 1. No acute findings. 2. Cardiomegaly. Electronically Signed   By: Signa Kell M.D.   On: 07/25/2023 05:09    Procedures Procedures: not indicated.   Medications Ordered in ED Medications  loratadine (CLARITIN) tablet 10 mg (10 mg Oral Given 07/25/23 0559)   insulin glargine-yfgn (SEMGLEE) injection 8 Units (has no administration in time range)  fentaNYL (SUBLIMAZE) injection 50 mcg (50 mcg Intravenous Given 07/25/23 0415)  fentaNYL (SUBLIMAZE) injection 50 mcg (50 mcg Intravenous Given 07/25/23 0516)    ED Course/ Medical Decision Making/ A&P                                 Medical Decision Making Amount and/or Complexity of Data Reviewed Labs: ordered. Radiology: ordered.  Risk Prescription drug management.   This patient presents to the ED for concern of abdominal pain and diarrhea, this involves an extensive number of treatment options, and is a complaint that carries with it a high risk of complications and morbidity.   Differential diagnosis includes: flu, COVID, RSV, other viral illness, pregnancy, IBS, IBD, gastroparesis, chronic abdominal pain, hyperglycemia, HHS, DKA, etc.   Comorbidities  See HPI above   Additional History  Additional history obtained from previous hospitalization records   Cardiac Monitoring / EKG  The patient was maintained on a cardiac monitor.  I personally viewed and interpreted the cardiac monitored which showed: sinus rhythm with a heart rate of 88 bpm.   Lab Tests  I ordered and personally interpreted labs.  The pertinent results include:   CBG of 430 Glucose of 396, BUN of 65, Cr of 8.26, AST of 74, ALT of 102, Alk Phos of 418, T-bili of 1.4 No anion gap CBC shows no signs of infection BNP > 4,500 Troponin of 25 Negative pregnancy test Negative respiratory panel   Imaging Studies  I ordered imaging studies including CXR  I independently visualized and interpreted imaging which showed:  No acute cardiopulmonary findings.  Stable cardiomegaly. I agree with the radiologist interpretation   Problem List / ED Course / Critical Interventions / Medication Management  Patient reports diffuse abdominal pain has been going on for months now.  She had a cholecystectomy on 07/08/2023  but is still having persistent symptoms.  Was admitted for the hospital for similar concern and discharged several days ago, still without improvement  of symptoms. Since LFTs were stable on CMP and pain was not worse than before, imaging not ordered. She called EMS tonight due to abdominal pain and 6 episodes of diarrhea today.  CBG was 462. Denies any associated nausea or vomiting.  No fevers or sick symptoms.  No known sick contact. Last hemoglobin A1c was 10.6 on 05/21/2023.  Average glucose usually around 258 per eAG/A1C conversion calculator. Also endorses some shortness of breath.  States this feels similar as to when she is "fluid overloaded."  Denies any chest pains.  She does endorse some swelling of the feet which she says is because the "guy at dialysis did not take the fluid off of her."  Last went to dialysis 2 days ago (on Tuesday). I ordered medications including: Fentanyl for pain  Reevaluation of the patient after these medicines showed that the patient improved. After second dose of Fentanyl patient began to complain of itching. Claritin since patient states Benadryl is an allergy.  Patient staffed with my attending, Dr. Clayborne Dana. Since patient's abdominal pain has been going on for months, and not worsened, and her labs are stable she does not need to be admitted. She has dialysis today and this may be beneficial to help relieve pain or fluid off of abdomen.  Ordered patient's morning long-acting insulin prior to discharge. BNP is typically elevated due to ESRD. CXR does not show fluid in the lungs - will avoid diuresing patient, since she has an appointment for dialysis in about 2 hours.   Social Determinants of Health  Access to healthcare   Test / Admission - Considered  Patient is stable and safe for discharge home. Return precautions given.       Final Clinical Impression(s) / ED Diagnoses Final diagnoses:  Generalized abdominal pain  Hyperglycemia    Rx / DC  Orders ED Discharge Orders          Ordered    dicyclomine (BENTYL) 20 MG tablet  2 times daily        07/25/23 0622              Maxwell Marion, PA-C 07/25/23 0630    Mesner, Barbara Cower, MD 07/26/23 [phone]

## 2023-07-25 NOTE — ED Triage Notes (Signed)
 Pt arrived from home via [facility] c/o abd 10/10 with 6 diarrhea stools today. Per ems pt hyperglycemic 462 enroute. Pt also noted to have cough, pt states that she gets the cough when she has fluid overload.

## 2023-07-26 ENCOUNTER — Ambulatory Visit (HOSPITAL_BASED_OUTPATIENT_CLINIC_OR_DEPARTMENT_OTHER): Payer: Medicaid Other | Admitting: Family

## 2023-07-26 NOTE — Progress Notes (Deleted)
  Cardiology Office Note:  .   Date:  07/26/2023  ID:  Ashley Freeman, DOB 1990-12-02, MRN [id number] PCP: Karie Georges Pap, MD  [facility] Providers Cardiologist:  Bryan Lemma, MD { Click to update primary MD,subspecialty MD or APP then REFRESH:1}   History of Present Illness: .   Ashley Freeman is a 33 y.o. female ***with history of ESRD on HD T/R/S, DM 1, heart failure, bipolar disorder    Prior cholecystectomy 07/08/2023.  Seen in ED yesterday with abdominal pain and diarrhea.  Also noted cough and shortness of breath.  Labs are stable.  She was given dose of fentanyl for pain but did require Claritin for itching.  Labs are stable and she was discharged to her outpatient dialysis.    ROS: ***  Studies Reviewed: .        *** Risk Assessment/Calculations:   {Does this patient have ATRIAL FIBRILLATION?:[phone]}         Physical Exam:   VS:  LMP  (LMP Unknown)    Wt Readings from Last 3 Encounters:  07/25/23 139 lb (63 kg)  07/19/23 130 lb (59 kg)  07/15/23 130 lb (59 kg)    GEN: Well nourished, well developed in no acute distress NECK: No JVD; No carotid bruits CARDIAC: ***RRR, no murmurs, rubs, gallops RESPIRATORY:  Clear to auscultation without rales, wheezing or rhonchi  ABDOMEN: Soft, non-tender, non-distended EXTREMITIES:  No edema; No deformity   ASSESSMENT AND PLAN: .    DM1-05/21/2023 A1c 10.6.    {Are you ordering a CV Procedure (e.g. stress test, cath, DCCV, TEE, etc)?   Press F2        :[id number]}  Dispo: ***  Signed, Alver Sorrow, NP

## 2023-08-14 ENCOUNTER — Other Ambulatory Visit: Payer: Self-pay

## 2023-08-14 ENCOUNTER — Emergency Department (HOSPITAL_COMMUNITY)
Admission: EM | Admit: 2023-08-14 | Discharge: 2023-08-15 | Disposition: A | Discharge status: Home / Self Care | Attending: Emergency Medicine | Admitting: Emergency Medicine

## 2023-08-14 ENCOUNTER — Encounter (HOSPITAL_COMMUNITY): Payer: Self-pay | Admitting: Emergency Medicine

## 2023-08-14 ENCOUNTER — Emergency Department (HOSPITAL_COMMUNITY)

## 2023-08-14 DIAGNOSIS — Z992 Dependence on renal dialysis: Secondary | ICD-10-CM | POA: Diagnosis not present

## 2023-08-14 DIAGNOSIS — I509 Heart failure, unspecified: Secondary | ICD-10-CM | POA: Diagnosis not present

## 2023-08-14 DIAGNOSIS — R1011 Right upper quadrant pain: Secondary | ICD-10-CM | POA: Diagnosis not present

## 2023-08-14 DIAGNOSIS — R0602 Shortness of breath: Secondary | ICD-10-CM | POA: Insufficient documentation

## 2023-08-14 DIAGNOSIS — E878 Other disorders of electrolyte and fluid balance, not elsewhere classified: Secondary | ICD-10-CM | POA: Diagnosis not present

## 2023-08-14 DIAGNOSIS — E871 Hypo-osmolality and hyponatremia: Secondary | ICD-10-CM | POA: Insufficient documentation

## 2023-08-14 DIAGNOSIS — R531 Weakness: Secondary | ICD-10-CM | POA: Diagnosis present

## 2023-08-14 DIAGNOSIS — E875 Hyperkalemia: Secondary | ICD-10-CM | POA: Diagnosis not present

## 2023-08-14 DIAGNOSIS — E1022 Type 1 diabetes mellitus with diabetic chronic kidney disease: Secondary | ICD-10-CM | POA: Diagnosis not present

## 2023-08-14 DIAGNOSIS — I132 Hypertensive heart and chronic kidney disease with heart failure and with stage 5 chronic kidney disease, or end stage renal disease: Secondary | ICD-10-CM | POA: Diagnosis not present

## 2023-08-14 DIAGNOSIS — N186 End stage renal disease: Secondary | ICD-10-CM | POA: Diagnosis not present

## 2023-08-14 LAB — CBC WITH DIFFERENTIAL/PLATELET
Abs Immature Granulocytes: 0.03 10*3/uL (ref 0.00–0.07)
Basophils Absolute: 0.1 10*3/uL (ref 0.0–0.1)
Basophils Relative: 1 %
Eosinophils Absolute: 0.5 10*3/uL (ref 0.0–0.5)
Eosinophils Relative: 8 %
HCT: 41.7 % (ref 36.0–46.0)
Hemoglobin: 13.2 g/dL (ref 12.0–15.0)
Immature Granulocytes: 0 %
Lymphocytes Relative: 40 %
Lymphs Abs: 2.7 10*3/uL (ref 0.7–4.0)
MCH: 27.2 pg (ref 26.0–34.0)
MCHC: 31.7 g/dL (ref 30.0–36.0)
MCV: 85.8 fL (ref 80.0–100.0)
Monocytes Absolute: 0.5 10*3/uL (ref 0.1–1.0)
Monocytes Relative: 7 %
Neutro Abs: 2.9 10*3/uL (ref 1.7–7.7)
Neutrophils Relative %: 44 %
Platelets: 196 10*3/uL (ref 150–400)
RBC: 4.86 MIL/uL (ref 3.87–5.11)
RDW: 18 % — ABNORMAL HIGH (ref 11.5–15.5)
WBC: 6.7 10*3/uL (ref 4.0–10.5)
nRBC: 0 % (ref 0.0–0.2)

## 2023-08-14 LAB — BASIC METABOLIC PANEL WITH GFR
Anion gap: 18 — ABNORMAL HIGH (ref 5–15)
BUN: 60 mg/dL — ABNORMAL HIGH (ref 6–20)
CO2: 24 mmol/L (ref 22–32)
Calcium: 7.6 mg/dL — ABNORMAL LOW (ref 8.9–10.3)
Chloride: 92 mmol/L — ABNORMAL LOW (ref 98–111)
Creatinine, Ser: 8.25 mg/dL — ABNORMAL HIGH (ref 0.44–1.00)
GFR, Estimated: 6 mL/min — ABNORMAL LOW (ref >60)
Glucose, Bld: 304 mg/dL — ABNORMAL HIGH (ref 70–99)
Potassium: 5.7 mmol/L — ABNORMAL HIGH (ref 3.5–5.1)
Sodium: 134 mmol/L — ABNORMAL LOW (ref 135–145)

## 2023-08-14 LAB — RESP PANEL BY RT-PCR (RSV, FLU A&B, COVID)  RVPGX2
Influenza A by PCR: NEGATIVE
Influenza B by PCR: NEGATIVE
Resp Syncytial Virus by PCR: NEGATIVE
SARS Coronavirus 2 by RT PCR: NEGATIVE

## 2023-08-14 LAB — PHOSPHORUS: Phosphorus: 5.7 mg/dL — ABNORMAL HIGH (ref 2.5–4.6)

## 2023-08-14 LAB — HCG, SERUM, QUALITATIVE: Preg, Serum: NEGATIVE

## 2023-08-14 LAB — ALBUMIN: Albumin: 3.5 g/dL (ref 3.5–5.0)

## 2023-08-14 MED ORDER — HYDROCODONE-ACETAMINOPHEN 5-325 MG PO TABS
1.0000 | ORAL_TABLET | Freq: Four times a day (QID) | ORAL | Status: DC | PRN
  Administered 2023-08-14 – 2023-08-15 (×2): 1 via ORAL
  Filled 2023-08-14 (×2): qty 1

## 2023-08-14 MED ORDER — CARVEDILOL 12.5 MG PO TABS: 25.0000 mg | ORAL_TABLET | Freq: Two times a day (BID) | ORAL | Status: DC

## 2023-08-14 MED ORDER — HYDROXYZINE HCL 10 MG PO TABS
10.0000 mg | ORAL_TABLET | Freq: Once | ORAL | Status: AC
  Administered 2023-08-14: 10 mg via ORAL
  Filled 2023-08-14: qty 1

## 2023-08-14 MED ORDER — SODIUM ZIRCONIUM CYCLOSILICATE 10 G PO PACK
10.0000 g | PACK | Freq: Once | ORAL | Status: AC
  Administered 2023-08-14: 10 g via ORAL
  Filled 2023-08-14: qty 1

## 2023-08-14 MED ORDER — HYDROXYZINE HCL 25 MG PO TABS
25.0000 mg | ORAL_TABLET | Freq: Once | ORAL | Status: AC
  Administered 2023-08-14: 25 mg via ORAL
  Filled 2023-08-14: qty 1

## 2023-08-14 MED ORDER — FENTANYL CITRATE PF 50 MCG/ML IJ SOSY
50.0000 ug | PREFILLED_SYRINGE | Freq: Once | INTRAMUSCULAR | Status: AC
  Administered 2023-08-14: 50 ug via INTRAVENOUS
  Filled 2023-08-14: qty 1

## 2023-08-14 MED ORDER — CHLORHEXIDINE GLUCONATE CLOTH 2 % EX PADS: 6.0000 | MEDICATED_PAD | Freq: Every day | CUTANEOUS | Status: DC

## 2023-08-14 NOTE — Consult Note (Signed)
 Renal Service Consult Note [facility]  Ashley Freeman 08/14/2023 [name], MD Requesting Physician: Dr. Rush Landmark  Reason for Consult: ESRD pt w/ myalgias, rib pain, missed HD HPI: The patient is a 33 y.o. year-old w/ PMH as below who presented to ED this am c/o body aches, rib pain, and missed HD yesterday. TTS HD pt in [place]. No fevers.  In ED blood pressure 163/75, heart rate 72, RR 22, temp 97.5.  Labs showed K+ 5.7, BUN 60, creatinine 8.2, Hgb 13.  Swab for COVID, RSV and influenza was negative.  Chest x-ray was read as no acute disease.  Patient is being admitted, we are asked to see for dialysis   Pt seen in ED hallway, patient's main complaint is body pains and some bodyaches.  She has been on dialysis for 2 years, she has a Medicaid ride to dialysis and lives with her mother.  Access is in the right upper arm.   ROS - denies CP, no joint pain, no HA, no blurry vision, no rash, no diarrhea, no nausea/ vomiting  PMH: Anemia Bipolar disorder Depression DM type I DKA ESRD on HD HFrEF HL HTN  Past Surgical History  Past Surgical History:  Procedure Laterality Date   A/V FISTULAGRAM Right 06/17/2023   Procedure: A/V Fistulagram;  Surgeon: Annice Needy, MD;  Location: [facility] INVASIVE CV LAB;  Service: Cardiovascular;  Laterality: Right;   AV FISTULA PLACEMENT Right 07/06/2022   Procedure: ARTERIOVENOUS GRAFT CREATION;  Surgeon: Cephus Shelling, MD;  Location: [facility] OR;  Service: Vascular;  Laterality: Right;   CESAREAN SECTION N/A 10/05/2019   Procedure: CESAREAN SECTION;  Surgeon: Laredo Bing, MD;  Location: [facility] LD ORS;  Service: Obstetrics;  Laterality: N/A;   CHOLECYSTECTOMY N/A 07/02/2023   Procedure: LAPAROSCOPIC CHOLECYSTECTOMY;  Surgeon: Emelia Loron, MD;  Location: [facility] OR;  Service: General;  Laterality: N/A;   INCISION AND DRAINAGE ABSCESS Left 09/28/2019   Procedure: INCISION AND DRAINAGE VULVAR ABCESS;  Surgeon:  Tilda Burrow, MD;  Location: [facility] OR;  Service: Gynecology;  Laterality: Left;   INCISION AND DRAINAGE PERIRECTAL ABSCESS Right 08/18/2013   Procedure: IRRIGATION AND DEBRIDEMENT GLUTEAL ABSCESS;  Surgeon: Axel Filler, MD;  Location: [facility] OR;  Service: General;  Laterality: Right;   INCISION AND DRAINAGE PERIRECTAL ABSCESS Right 09/19/2013   Procedure: IRRIGATION AND DEBRIDEMENT RIGHT GLUTEAL AND LABIAL ABSCESSES;  Surgeon: Axel Filler, MD;  Location: [facility] OR;  Service: General;  Laterality: Right;   INCISION AND DRAINAGE PERIRECTAL ABSCESS Right 09/24/2013   Procedure: IRRIGATION AND DEBRIDEMENT PERIRECTAL ABSCESS;  Surgeon: Cherylynn Ridges, MD;  Location: [facility] OR;  Service: General;  Laterality: Right;   Family History  Family History  Problem Relation Age of Onset   Asthma Mother    Carpal tunnel syndrome Mother    Gout Father    Diabetes Paternal Grandmother    Anesthesia problems Neg Hx    Social History  reports that she has never smoked. She has never been exposed to tobacco smoke. She has never used smokeless tobacco. She reports that she does not currently use alcohol. She reports that she does not use drugs. Allergies  Allergies  Allergen Reactions   Cephalexin Anaphylaxis    Has gotten ceftriaxone in the past    Morphine Itching   Penicillins Hives and Rash   Benadryl [Diphenhydramine] Itching   Dilaudid [Hydromorphone] Itching   Doxycycline Itching   Methotrexate Derivatives Rash   Oxycodone Itching   Home medications  Prior to Admission medications   Medication Sig Start Date End Date Taking? Authorizing Provider  acetaminophen (TYLENOL) 500 MG tablet Take 1,000 mg by mouth as needed for mild pain (pain score 1-3) or moderate pain (pain score 4-6).    [provider]  albuterol (VENTOLIN HFA) 108 (90 Base) MCG/ACT inhaler Inhale 2 puffs into the lungs every 4 (four) hours as needed for wheezing or shortness of breath. 11/24/22   [provider]   calcitRIOL (ROCALTROL) 0.25 MCG capsule Take 5 capsules (1.25 mcg total) by mouth every Tuesday, Thursday, and Saturday at 6 PM. 07/09/23   Oral Billings, MD  carvedilol (COREG) 25 MG tablet Take 25 mg by mouth 2 (two) times daily with a meal. 05/15/23   [provider]  Continuous Glucose Sensor (DEXCOM G7 SENSOR) MISC Change sensors every 10 days 09/07/22   Shamleffer, Ibtehal Jaralla, MD  dicyclomine (BENTYL) 20 MG tablet Take 1 tablet (20 mg total) by mouth 2 (two) times daily for 14 days. 07/25/23 08/08/23  Sonnie Dusky, PA-C  FLUoxetine (PROZAC) 20 MG capsule Take 1 capsule by mouth daily. 03/23/23   [provider]  HYDROcodone-acetaminophen (NORCO/VICODIN) 5-325 MG tablet Take 1 tablet by mouth every 6 (six) hours as needed for moderate pain (pain score 4-6) or severe pain (pain score 7-10). 07/23/23   Hoyt Macleod, MD  hydrOXYzine (ATARAX) 25 MG tablet Take 25 mg by mouth in the morning and at bedtime. 05/14/23   [provider]  Insulin Aspart FlexPen (NOVOLOG) 100 UNIT/ML Inject 4 Units into the skin 3 (three) times daily. 05/10/23 07/20/23  [provider]  lamoTRIgine (LAMICTAL) 200 MG tablet Take 200 mg by mouth daily. 07/11/23   [provider]  LANTUS SOLOSTAR 100 UNIT/ML Solostar Pen Inject 8 Units into the skin daily.    [provider]  LORazepam (ATIVAN) 1 MG tablet Take 1 mg by mouth daily as needed. 07/11/23   [provider]  losartan (COZAAR) 100 MG tablet Take 1 tablet by mouth daily. 03/25/23   [provider]  ondansetron (ZOFRAN-ODT) 4 MG disintegrating tablet Take 1 tablet (4 mg total) by mouth every 8 (eight) hours as needed for nausea or vomiting. 06/23/23   Davis, Jonathon H, MD  polyethylene glycol (MIRALAX / GLYCOLAX) 17 g packet Take 17 g by mouth daily as needed for mild constipation. 12/07/22   [provider]  rosuvastatin (CRESTOR) 40 MG tablet Take 40 mg by mouth daily. 05/22/22   [provider]  sevelamer carbonate (RENVELA) 800 MG tablet Take 2,400 mg by mouth 3 (three) times daily with meals.    [provider]  SUMAtriptan (IMITREX) 50 MG tablet Take 50 mg by mouth every 2 (two) hours as needed for migraine or headache. 06/20/22   [provider]     Vitals:   08/14/23 1048 08/14/23 1101  BP: (!) 163/95   Pulse: 73   Resp: (!) 22 19  Temp: 97.6 F (36.4 C)   TempSrc: Oral    Exam Gen alert, no distress, chronically ill appearing No rash, cyanosis or gangrene Sclera anicteric, throat clear  JVD  10-12 cm Chest clear bilat to bases, no rales/ wheezing RRR no MRG Abd soft ntnd no mass or ascites +bs GU defer MS no joint effusions or deformity Ext no LE or UE edema, no other edema Neuro is alert, Ox 3 , nf    RUA AVG+bruit     Renal-related home meds: Rocaltrol 1.25  mcg TTS Coreg 25 twice daily Losartan 100 mg daily Renvela 2.4 g AC 3 times daily   OP HD: TTS [facility] [address] [facility]  3h   B350  57.5kg  R AVG  Heparin 1600 + 600u/hr Rocaltrol 1.0 three times per week Mircera 75 q 4 wks Sensipar 60 tiw   Assessment/ Plan: Hyperkalemia: K+ 5.7, no EKG changes. Treat w/ lokelma and renal diet, and HD.  ESRD: on HD TTS. Missed HD yesterday. Plan is for HD tomorrow 1st shift on schedule.  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

## 2023-08-14 NOTE — ED Triage Notes (Signed)
 Pt BIB by [facility] c/o generalized body aches and rib pain. Missed dialysis treatment on Tuesday d/t symptoms. Tues/Thurs/Sat schedule. Denies any fevers, N/V/D.

## 2023-08-14 NOTE — ED Notes (Signed)
 Patient transported to X-ray

## 2023-08-14 NOTE — ED Provider Notes (Signed)
 [place] [facility]MENT AT [facility] Provider Note   CSN: [id number] Arrival date & time: 08/14/23  1042     History  Chief Complaint  Patient presents with   Weakness    Ashley Freeman is a 33 y.o. female with PMHx HLD, DM type 1, anemia, bipolar disorder, depression, ESRD, CHF, HTN, chronic abdominal pain who presents to ED concerned for body aches, RUQ pain, and mild SOB. These symptoms have been progressing over the past 2 days. Patient missed dialysis yesterday d/t these symptoms. Last dialysis appointment was Saturday 4/12. Patient denies rhinorrhea, congestion, diarrhea, fever. She does endorse an episode of vomiting earlier today but denies nausea currently.     Weakness      Home Medications Prior to Admission medications   Medication Sig Start Date End Date Taking? Authorizing Provider  acetaminophen (TYLENOL) 500 MG tablet Take 1,000 mg by mouth as needed for mild pain (pain score 1-3) or moderate pain (pain score 4-6).    [provider]  albuterol (VENTOLIN HFA) 108 (90 Base) MCG/ACT inhaler Inhale 2 puffs into the lungs every 4 (four) hours as needed for wheezing or shortness of breath. 11/24/22   [provider]  calcitRIOL (ROCALTROL) 0.25 MCG capsule Take 5 capsules (1.25 mcg total) by mouth every Tuesday, Thursday, and Saturday at 6 PM. 07/09/23   Jerald Kief, MD  carvedilol (COREG) 25 MG tablet Take 25 mg by mouth 2 (two) times daily with a meal. 05/15/23   [provider]  Continuous Glucose Sensor (DEXCOM G7 SENSOR) MISC Change sensors every 10 days 09/07/22   Shamleffer, Konrad Dolores, MD  dicyclomine (BENTYL) 20 MG tablet Take 1 tablet (20 mg total) by mouth 2 (two) times daily for 14 days. 07/25/23 08/08/23  Maxwell Marion, PA-C  FLUoxetine (PROZAC) 20 MG capsule Take 1 capsule by mouth daily. 03/23/23   [provider]  HYDROcodone-acetaminophen (NORCO/VICODIN) 5-325 MG tablet Take 1 tablet  by mouth every 6 (six) hours as needed for moderate pain (pain score 4-6) or severe pain (pain score 7-10). 07/23/23   Lorin Glass, MD  hydrOXYzine (ATARAX) 25 MG tablet Take 25 mg by mouth in the morning and at bedtime. 05/14/23   [provider]  Insulin Aspart FlexPen (NOVOLOG) 100 UNIT/ML Inject 4 Units into the skin 3 (three) times daily. 05/10/23 07/20/23  [provider]  lamoTRIgine (LAMICTAL) 200 MG tablet Take 200 mg by mouth daily. 07/11/23   [provider]  LANTUS SOLOSTAR 100 UNIT/ML Solostar Pen Inject 8 Units into the skin daily.    [provider]  LORazepam (ATIVAN) 1 MG tablet Take 1 mg by mouth daily as needed. 07/11/23   [provider]  losartan (COZAAR) 100 MG tablet Take 1 tablet by mouth daily. 03/25/23   [provider]  ondansetron (ZOFRAN-ODT) 4 MG disintegrating tablet Take 1 tablet (4 mg total) by mouth every 8 (eight) hours as needed for nausea or vomiting. 06/23/23   Laurence Spates, MD  polyethylene glycol (MIRALAX / GLYCOLAX) 17 g packet Take 17 g by mouth daily as needed for mild constipation. 12/07/22   [provider]  rosuvastatin (CRESTOR) 40 MG tablet Take 40 mg by mouth daily. 05/22/22   [provider]  sevelamer carbonate (RENVELA) 800 MG tablet Take 2,400 mg by mouth 3 (three) times daily with meals.    [provider]  SUMAtriptan (IMITREX) 50 MG tablet Take 50 mg by mouth every 2 (two) hours  as needed for migraine or headache. 06/20/22   [provider]      Allergies    Cephalexin, Morphine, Penicillins, Benadryl [diphenhydramine], Dilaudid [hydromorphone], Doxycycline, Methotrexate derivatives, and Oxycodone    Review of Systems   Review of Systems  Neurological:  Positive for weakness.    Physical Exam Updated Vital Signs BP (!) 163/95   Pulse 73   Temp 97.6 F (36.4 C) (Oral)   Resp 19  Physical Exam Vitals and nursing note reviewed.  Constitutional:       General: She is not in acute distress.    Appearance: She is not ill-appearing or toxic-appearing.  HENT:     Head: Normocephalic and atraumatic.     Mouth/Throat:     Mouth: Mucous membranes are moist.  Eyes:     General: No scleral icterus.       Right eye: No discharge.        Left eye: No discharge.     Conjunctiva/sclera: Conjunctivae normal.  Cardiovascular:     Rate and Rhythm: Normal rate and regular rhythm.     Pulses: Normal pulses.     Heart sounds: Normal heart sounds. No murmur heard. Pulmonary:     Effort: Pulmonary effort is normal. No respiratory distress.     Breath sounds: Normal breath sounds. No wheezing, rhonchi or rales.  Abdominal:     General: Abdomen is flat. Bowel sounds are normal.     Palpations: Abdomen is soft.     Tenderness: There is abdominal tenderness.     Comments: Non-focal abdominal tenderness to palpation.  Musculoskeletal:     Right lower leg: No edema.     Left lower leg: No edema.  Skin:    General: Skin is warm and dry.     Findings: No rash.  Neurological:     General: No focal deficit present.     Mental Status: She is alert and oriented to person, place, and time. Mental status is at baseline.  Psychiatric:        Mood and Affect: Mood normal.     ED Results / Procedures / Treatments   Labs (all labs ordered are listed, but only abnormal results are displayed) Labs Reviewed  BASIC METABOLIC PANEL WITH GFR - Abnormal; Notable for the following components:      Result Value   Sodium 134 (*)    Potassium 5.7 (*)    Chloride 92 (*)    Glucose, Bld 304 (*)    BUN 60 (*)    Creatinine, Ser 8.25 (*)    Calcium 7.6 (*)    GFR, Estimated 6 (*)    Anion gap 18 (*)    All other components within normal limits  CBC WITH DIFFERENTIAL/PLATELET - Abnormal; Notable for the following components:   RDW 18.0 (*)    All other components within normal limits  RESP PANEL BY RT-PCR (RSV, FLU A&B, COVID)  RVPGX2  HCG, SERUM, QUALITATIVE     EKG None  Radiology DG Chest 2 View Result Date: 08/14/2023 CLINICAL DATA:  Chest pain. EXAM: CHEST - 2 VIEW COMPARISON:  07/25/2023. FINDINGS: Low lung volume. Bilateral lung fields are clear. Bilateral costophrenic angles are clear. Mildly enlarged cardio-mediastinal silhouette, which is likely accentuated by low lung volume and AP technique. No acute osseous abnormalities. The soft tissues are within normal limits. IMPRESSION: No active cardiopulmonary disease. Electronically Signed   By: Jules Schick M.D.   On: 08/14/2023 11:36    Procedures .  Critical Care  Performed by: Dorthy Cooler, PA-C Authorized by: Dorthy Cooler, PA-C   Critical care provider statement:    Critical care time (minutes):  30   Critical care was time spent personally by me on the following activities:  Development of treatment plan with patient or surrogate, discussions with consultants, evaluation of patient's response to treatment, examination of patient, ordering and review of laboratory studies, ordering and review of radiographic studies, ordering and performing treatments and interventions, pulse oximetry, re-evaluation of patient's condition and review of old charts   Care discussed with: admitting provider   Comments:     Hyponatremia, hyperkalemia, anion gap     Medications Ordered in ED Medications  fentaNYL (SUBLIMAZE) injection 50 mcg (has no administration in time range)  sodium zirconium cyclosilicate (LOKELMA) packet 10 g (has no administration in time range)    ED Course/ Medical Decision Making/ A&P                                 Medical Decision Making Amount and/or Complexity of Data Reviewed Labs: ordered. Radiology: ordered.  Risk Prescription drug management.   This patient presents to the ED for concern of shortness of breath, this involves an extensive number of treatment options, and is a complaint that carries with it a high risk of complications and  morbidity.  The differential diagnosis includes Anxiety, Anaphylaxis/Angioedema, Aspirated FB, Arrhythmia, CHF, Asthma, COPD, PNA, COVID/Flu/RSV, STEMI, Tamponade, TPNX, Sepsis   Co morbidities that complicate the patient evaluation  HLD, DM type 1, anemia, bipolar disorder, depression, ESRD, CHF, HTN    Additional history obtained:  Patient with multiple recent ED visits with abdominal pain - many recent CT Abd/Pelv without acute concern.     Problem List / ED Course / Critical interventions / Medication management  Patient presents to ED concern for progressing SOB, body aches, abdominal pain.  Physical exam with nonfocal tenderness to palpation of her abdomen.  Shared decision making with patient who has undergone many CT scans recently who would also like to withhold imaging today since abdominal pain feels similar to her chronic pain. Patient also stating that she has a GI appointment later this month. I will provide patient medicine for her chronic abdominal pain. I Ordered, and personally interpreted labs.  CBC without leukocytosis or anemia.  hCG negative.  Respiratory panel negative.  BMP with hyponatremia 134, hyperkalemia 5.7.  Chloride is low at 92.  There is also an anion gap at 18. The patient was maintained on a cardiac monitor.  I personally viewed and interpreted the cardiac monitored which showed an underlying rhythm of: Sinus rhythm I requested consultation with the nephrologist on-call Dr. Arlean Hopping,  and discussed lab and imaging findings as well as pertinent plan - they agree to admit patient for dialysis - dialysis possibly later tonight but might be tomorrow morning. They also recommend lokelma and renal diet. I appreciate their help. I have reviewed the patients home medicines and have made adjustments as needed    Social Determinants of Health:  none          Final Clinical Impression(s) / ED Diagnoses Final diagnoses:  ESRD (end stage renal disease)  (HCC)  Hyperkalemia  SOB (shortness of breath)    Rx / DC Orders ED Discharge Orders     None         Dorthy Cooler, [place] 08/14/23 1500  Tegeler, Marine Sia, MD 08/14/23 [phone]

## 2023-08-14 NOTE — Inpatient Diabetes Management (Signed)
 Inpatient Diabetes Program Recommendations  AACE/ADA: New Consensus Statement on Inpatient Glycemic Control (2015)  Target Ranges:  Prepandial:   less than 140 mg/dL      Peak postprandial:   less than 180 mg/dL (1-2 hours)      Critically ill patients:  140 - 180 mg/dL   Lab Results  Component Value Date   GLUCAP 397 (H) 07/25/2023   HGBA1C 10.6 (H) 05/21/2023    Review of Glycemic Control  Latest Reference Range & Units 08/14/23 12:30  Glucose 70 - 99 mg/dL 098 (H)   Diabetes history: Diabetes Type 1 (requires basal insulin + Correction +meal coverage) Outpatient Diabetes medications: Lantus 8 units Daily, Novolog 4 units tid  Current orders for Inpatient glycemic control:  None being evaluated in the ED  Inpatient Diabetes Program Recommendations:    -   Semglee 6 units -   Novolog 0-6 units Q4 if NPO tid + hs if diet ordered  Multiple endocrinology providers in the past. Providers and trainers linda spagnola attempted to get pt on insulin pump pt missed several appointments and attempts to place pt on pump.   A1c 10.6% on 1/21   Will touch base with pt this admission.  Thanks,  Eloise Hake RN, MSN, BC-ADM Inpatient Diabetes Coordinator Team Pager [phone] (8a-5p)

## 2023-08-15 LAB — HEPATITIS B SURFACE ANTIBODY, QUANTITATIVE: Hep B S AB Quant (Post): <3.5 m[IU]/mL — ABNORMAL LOW

## 2023-08-15 MED ORDER — HEPARIN SODIUM (PORCINE) 1000 UNIT/ML DIALYSIS: 1500.0000 [IU] | INTRAMUSCULAR | Status: DC | PRN

## 2023-08-15 MED ORDER — HEPARIN SODIUM (PORCINE) 1000 UNIT/ML DIALYSIS
2000.0000 [IU] | Freq: Once | INTRAMUSCULAR | Status: AC
  Administered 2023-08-15: 2000 [IU] via INTRAVENOUS_CENTRAL

## 2023-08-15 MED ORDER — HEPARIN SODIUM (PORCINE) 1000 UNIT/ML IJ SOLN
INTRAMUSCULAR | Status: AC
  Filled 2023-08-15: qty 4

## 2023-08-15 MED ORDER — METOCLOPRAMIDE HCL 5 MG/ML IJ SOLN
10.0000 mg | Freq: Once | INTRAMUSCULAR | Status: AC
  Administered 2023-08-15: 10 mg via INTRAVENOUS
  Filled 2023-08-15: qty 2

## 2023-08-15 NOTE — Progress Notes (Signed)
 Received patient in bed to unit.  Alert and oriented.  Informed consent signed and in chart.   TX duration: 3 hours ( pt signed AMA with one hour remaining )  Patient tolerated well.  Transported back to the room  Alert, without acute distress.  Hand-off given to patient's nurse.   Access used: Right AV Access issues: None  Total UF removed: 1600 Medication(s) given: None   08/15/23 1125  Vitals  Temp 98 F (36.7 C)  Pulse Rate 78  Resp 17  BP (!) 145/85  SpO2 98 %  Oxygen Therapy  Patient Activity (if Appropriate) In bed  Pulse Oximetry Type Continuous  Oximetry Probe Site Changed No  Post Treatment  Liters Processed 41.4  Fluid Removed (mL) 1.6 mL  Tolerated HD Treatment Yes  AVG/AVF Arterial Site Held (minutes) 10 minutes  AVG/AVF Venous Site Held (minutes) 10 minutes

## 2023-08-15 NOTE — Procedures (Addendum)
 Pt is stable on HD this am. Tolerating procedure well. Will be returned to ED after HD completed.  I was present at the procedure, reviewed the HD regimen and made appropriate changes.   Rob Tax adviser MD  [facility] 08/15/2023, 11:00 AM   Vitals:   08/15/23 0900 08/15/23 0930 08/15/23 1000 08/15/23 1030  BP: (!) 140/88 (!) 161/101 (!) 146/117 (!) 135/100  Pulse: 69 76 78 80  Resp: (!) 23 (!) 21 20 (!) 21  Temp:      TempSrc:      SpO2: 98% 97% 99% 100%    Recent Labs  Lab 08/14/23 1230 08/14/23 1655  HGB 13.2  --   ALBUMIN  --  3.5  CALCIUM 7.6*  --   PHOS  --  5.7*  CREATININE 8.25*  --   K 5.7*  --    No results for input(s): "IRON", "TIBC", "FERRITIN" in the last 168 hours. Inpatient medications:  heparin sodium (porcine)       carvedilol  25 mg Oral BID WC   Chlorhexidine Gluconate Cloth  6 each Topical Q0600   [START ON 08/16/2023] heparin  2,000 Units Dialysis Once in dialysis    heparin sodium (porcine), [START ON 08/16/2023] heparin, HYDROcodone-acetaminophen

## 2023-08-15 NOTE — Progress Notes (Addendum)
 Attempted to reach staff at [facility] to be advised of pt's d/c today from ED. Unable to reach any staff.   Lauraine Polite Renal Navigator [phone]   Addendum 08/16/23 @8 :22 am: Spoke to RN at [facility] this morning. Clinic aware pt should resume care tomorrow. Nephrology notes faxed to clinic per their request for continuation of care.

## 2023-08-15 NOTE — ED Notes (Signed)
 Pending HD, report given to HD.

## 2023-08-15 NOTE — ED Notes (Signed)
 Patient discharged by this RN. Patient ambulatory to lobby at time of discharge.

## 2023-08-15 NOTE — Discharge Instructions (Signed)
 You were seen for your abdominal pain and dialysis in the emergency department.   At home, please take Tylenol and the other pain medications you have been prescribed for your abdominal pain.    Follow-up with your primary doctor in 2-3 days regarding your visit.  Follow-up with your surgeon about your abdominal pain.  Return immediately to the emergency department if you experience any of the following: Worsening pain, fevers, or any other concerning symptoms.    Thank you for visiting our Emergency Department. It was a pleasure taking care of you today.

## 2023-08-15 NOTE — ED Provider Notes (Signed)
 33 year old female with a history of ESRD and cholecystectomy who presented to the emergency department right-sided abdominal pain.  Has been seen multiple times for this.  Did have a cholecystectomy that was performed recently.  Patient was offered a CT scan but declined.  She went for dialysis and has now returned.  She is not in respiratory distress.  Satting well on room air.  Does have some mild abdominal distention which appears to be chronic.  No significant abdominal tenderness to palpation.  No rebound or guarding.  Will have her follow-up with dialysis for additional treatments, her primary doctor and general surgery regarding her abdominal pain.   Ninetta Basket, MD 08/15/23 [phone]

## 2023-08-15 NOTE — ED Notes (Signed)
 Pt given meal tray, but preferred something different. Other re-ordered different tray. Transport here to take pt to HD prior to receiving (another) meal tray. Pt agreeable with not waiting.

## 2023-08-16 LAB — MISC LABCORP TEST (SEND OUT): Labcorp test code: 6510

## 2023-08-26 ENCOUNTER — Other Ambulatory Visit: Payer: Self-pay

## 2023-08-26 ENCOUNTER — Emergency Department (HOSPITAL_COMMUNITY)

## 2023-08-26 ENCOUNTER — Inpatient Hospital Stay (HOSPITAL_COMMUNITY)
Admission: EM | Admit: 2023-08-26 | Discharge: 2023-09-02 | DRG: 637 | Disposition: A | Discharge status: Home / Self Care | Attending: Internal Medicine | Admitting: Internal Medicine

## 2023-08-26 DIAGNOSIS — Z992 Dependence on renal dialysis: Secondary | ICD-10-CM

## 2023-08-26 DIAGNOSIS — R197 Diarrhea, unspecified: Secondary | ICD-10-CM | POA: Diagnosis not present

## 2023-08-26 DIAGNOSIS — E875 Hyperkalemia: Secondary | ICD-10-CM | POA: Diagnosis present

## 2023-08-26 DIAGNOSIS — R1084 Generalized abdominal pain: Secondary | ICD-10-CM

## 2023-08-26 DIAGNOSIS — E1010 Type 1 diabetes mellitus with ketoacidosis without coma: Principal | ICD-10-CM | POA: Diagnosis present

## 2023-08-26 DIAGNOSIS — Z880 Allergy status to penicillin: Secondary | ICD-10-CM

## 2023-08-26 DIAGNOSIS — R9431 Abnormal electrocardiogram [ECG] [EKG]: Secondary | ICD-10-CM | POA: Diagnosis present

## 2023-08-26 DIAGNOSIS — E1165 Type 2 diabetes mellitus with hyperglycemia: Secondary | ICD-10-CM | POA: Diagnosis present

## 2023-08-26 DIAGNOSIS — F319 Bipolar disorder, unspecified: Secondary | ICD-10-CM | POA: Diagnosis present

## 2023-08-26 DIAGNOSIS — Z9049 Acquired absence of other specified parts of digestive tract: Secondary | ICD-10-CM

## 2023-08-26 DIAGNOSIS — Z888 Allergy status to other drugs, medicaments and biological substances status: Secondary | ICD-10-CM

## 2023-08-26 DIAGNOSIS — Z881 Allergy status to other antibiotic agents status: Secondary | ICD-10-CM

## 2023-08-26 DIAGNOSIS — G8929 Other chronic pain: Secondary | ICD-10-CM | POA: Diagnosis present

## 2023-08-26 DIAGNOSIS — M545 Low back pain, unspecified: Secondary | ICD-10-CM | POA: Diagnosis present

## 2023-08-26 DIAGNOSIS — I5043 Acute on chronic combined systolic (congestive) and diastolic (congestive) heart failure: Secondary | ICD-10-CM | POA: Diagnosis present

## 2023-08-26 DIAGNOSIS — K766 Portal hypertension: Secondary | ICD-10-CM | POA: Diagnosis present

## 2023-08-26 DIAGNOSIS — I132 Hypertensive heart and chronic kidney disease with heart failure and with stage 5 chronic kidney disease, or end stage renal disease: Secondary | ICD-10-CM | POA: Diagnosis present

## 2023-08-26 DIAGNOSIS — N2581 Secondary hyperparathyroidism of renal origin: Secondary | ICD-10-CM | POA: Diagnosis present

## 2023-08-26 DIAGNOSIS — Z79899 Other long term (current) drug therapy: Secondary | ICD-10-CM

## 2023-08-26 DIAGNOSIS — E10649 Type 1 diabetes mellitus with hypoglycemia without coma: Secondary | ICD-10-CM | POA: Diagnosis not present

## 2023-08-26 DIAGNOSIS — E1022 Type 1 diabetes mellitus with diabetic chronic kidney disease: Secondary | ICD-10-CM | POA: Diagnosis present

## 2023-08-26 DIAGNOSIS — Z833 Family history of diabetes mellitus: Secondary | ICD-10-CM

## 2023-08-26 DIAGNOSIS — Z8619 Personal history of other infectious and parasitic diseases: Secondary | ICD-10-CM

## 2023-08-26 DIAGNOSIS — Z885 Allergy status to narcotic agent status: Secondary | ICD-10-CM

## 2023-08-26 DIAGNOSIS — I428 Other cardiomyopathies: Secondary | ICD-10-CM | POA: Diagnosis present

## 2023-08-26 DIAGNOSIS — R188 Other ascites: Secondary | ICD-10-CM | POA: Diagnosis present

## 2023-08-26 DIAGNOSIS — R0989 Other specified symptoms and signs involving the circulatory and respiratory systems: Secondary | ICD-10-CM | POA: Diagnosis present

## 2023-08-26 DIAGNOSIS — D631 Anemia in chronic kidney disease: Secondary | ICD-10-CM | POA: Diagnosis present

## 2023-08-26 DIAGNOSIS — E1110 Type 2 diabetes mellitus with ketoacidosis without coma: Secondary | ICD-10-CM | POA: Diagnosis present

## 2023-08-26 DIAGNOSIS — E785 Hyperlipidemia, unspecified: Secondary | ICD-10-CM | POA: Diagnosis present

## 2023-08-26 DIAGNOSIS — Z794 Long term (current) use of insulin: Secondary | ICD-10-CM

## 2023-08-26 DIAGNOSIS — I5042 Chronic combined systolic (congestive) and diastolic (congestive) heart failure: Secondary | ICD-10-CM | POA: Diagnosis present

## 2023-08-26 DIAGNOSIS — N186 End stage renal disease: Secondary | ICD-10-CM

## 2023-08-26 LAB — BLOOD GAS, VENOUS
Acid-base deficit: 4.4 mmol/L — ABNORMAL HIGH (ref 0.0–2.0)
Bicarbonate: 20.2 mmol/L (ref 20.0–28.0)
Drawn by: 8595
O2 Saturation: 86.3 %
Patient temperature: 37
pCO2, Ven: 35 mmHg — ABNORMAL LOW (ref 44–60)
pH, Ven: 7.37 (ref 7.25–7.43)
pO2, Ven: 59 mmHg — ABNORMAL HIGH (ref 32–45)

## 2023-08-26 LAB — CBC
HCT: 41.7 % (ref 36.0–46.0)
Hemoglobin: 12.1 g/dL (ref 12.0–15.0)
MCH: 27.4 pg (ref 26.0–34.0)
MCHC: 29 g/dL — ABNORMAL LOW (ref 30.0–36.0)
MCV: 94.6 fL (ref 80.0–100.0)
Platelets: 159 10*3/uL (ref 150–400)
RBC: 4.41 MIL/uL (ref 3.87–5.11)
RDW: 18.7 % — ABNORMAL HIGH (ref 11.5–15.5)
WBC: 6.8 10*3/uL (ref 4.0–10.5)
nRBC: 0 % (ref 0.0–0.2)

## 2023-08-26 LAB — COMPREHENSIVE METABOLIC PANEL WITH GFR
ALT: 49 U/L — ABNORMAL HIGH (ref 0–44)
AST: 46 U/L — ABNORMAL HIGH (ref 15–41)
Albumin: 3.2 g/dL — ABNORMAL LOW (ref 3.5–5.0)
Alkaline Phosphatase: 362 U/L — ABNORMAL HIGH (ref 38–126)
Anion gap: 18 — ABNORMAL HIGH (ref 5–15)
BUN: 46 mg/dL — ABNORMAL HIGH (ref 6–20)
CO2: 15 mmol/L — ABNORMAL LOW (ref 22–32)
Calcium: 7.5 mg/dL — ABNORMAL LOW (ref 8.9–10.3)
Chloride: 96 mmol/L — ABNORMAL LOW (ref 98–111)
Creatinine, Ser: 6 mg/dL — ABNORMAL HIGH (ref 0.44–1.00)
GFR, Estimated: 9 mL/min — ABNORMAL LOW (ref >60)
Glucose, Bld: 1022 mg/dL (ref 70–99)
Potassium: 5.4 mmol/L — ABNORMAL HIGH (ref 3.5–5.1)
Sodium: 129 mmol/L — ABNORMAL LOW (ref 135–145)
Total Bilirubin: 1.2 mg/dL (ref 0.0–1.2)
Total Protein: 6.2 g/dL — ABNORMAL LOW (ref 6.5–8.1)

## 2023-08-26 LAB — CBG MONITORING, ED
Glucose-Capillary: >600 mg/dL (ref 70–99)
Glucose-Capillary: >600 mg/dL (ref 70–99)

## 2023-08-26 LAB — BETA-HYDROXYBUTYRIC ACID: Beta-Hydroxybutyric Acid: 0.3 mmol/L — ABNORMAL HIGH (ref 0.05–0.27)

## 2023-08-26 LAB — LIPASE, BLOOD: Lipase: 27 U/L (ref 11–51)

## 2023-08-26 MED ORDER — FENTANYL CITRATE PF 50 MCG/ML IJ SOSY
25.0000 ug | PREFILLED_SYRINGE | Freq: Once | INTRAMUSCULAR | Status: AC
  Administered 2023-08-27: 25 ug via INTRAVENOUS
  Filled 2023-08-26: qty 1

## 2023-08-26 MED ORDER — INSULIN ASPART 100 UNIT/ML IJ SOLN
8.0000 [IU] | Freq: Once | INTRAMUSCULAR | Status: AC
  Administered 2023-08-26: 8 [IU] via SUBCUTANEOUS
  Filled 2023-08-26: qty 0.08

## 2023-08-26 MED ORDER — LACTATED RINGERS IV SOLN
INTRAVENOUS | Status: DC
Start: 2023-08-26 — End: 2023-08-27

## 2023-08-26 MED ORDER — INSULIN REGULAR(HUMAN) IN NACL 100-0.9 UT/100ML-% IV SOLN
INTRAVENOUS | Status: DC
  Administered 2023-08-27: 0.2 [IU]/h via INTRAVENOUS
  Administered 2023-08-27: 5.5 [IU]/h via INTRAVENOUS
  Filled 2023-08-26: qty 100

## 2023-08-26 MED ORDER — DEXTROSE 50 % IV SOLN
0.0000 mL | INTRAVENOUS | Status: DC | PRN
  Administered 2023-08-28 – 2023-09-01 (×5): 50 mL via INTRAVENOUS
  Filled 2023-08-26 (×5): qty 50

## 2023-08-26 MED ORDER — DEXTROSE IN LACTATED RINGERS 5 % IV SOLN: INTRAVENOUS | Status: DC

## 2023-08-26 NOTE — ED Triage Notes (Addendum)
 Pt presents to ED for generalized abdominal pain that started today and hyperglycemia. CBG for EMS was 470. Pt reports taking insulin  this morning. Pt is Tuesday, Thursday, and Saturday dialysis patient. Pt is coughing at the time of triage and states this happens when she gets too much fluid

## 2023-08-26 NOTE — ED Provider Notes (Signed)
  EMERGENCY DEPARTMENT AT [facility] Provider Note   CSN: [id number] Arrival date & time: 08/26/23  2130     History  Chief Complaint  Patient presents with   Abdominal Pain   Hyperglycemia    Ashley Freeman is a 33 y.o. female.  Generalized abdominal pain starting today.  Diffuse.  Reports insulin  compliance, but blood sugar 470 with EMS and 600 here.  No nausea no vomiting. Normal BM.    Abdominal Pain Hyperglycemia Associated symptoms: abdominal pain        Home Medications Prior to Admission medications   Medication Sig Start Date End Date Taking? Authorizing Provider  acetaminophen  (TYLENOL ) 500 MG tablet Take 1,000 mg by mouth as needed for mild pain (pain score 1-3) or moderate pain (pain score 4-6).    [provider]  albuterol  (VENTOLIN  HFA) 108 (90 Base) MCG/ACT inhaler Inhale 2 puffs into the lungs every 4 (four) hours as needed for wheezing or shortness of breath. 11/24/22   [provider]  calcitRIOL  (ROCALTROL ) 0.25 MCG capsule Take 5 capsules (1.25 mcg total) by mouth every Tuesday, Thursday, and Saturday at 6 PM. 07/09/23   Oral Billings, MD  carvedilol  (COREG ) 25 MG tablet Take 25 mg by mouth 2 (two) times daily with a meal. 05/15/23   [provider]  Continuous Glucose Sensor (DEXCOM G7 SENSOR) MISC Change sensors every 10 days 09/07/22   Shamleffer, Ibtehal Jaralla, MD  dicyclomine  (BENTYL ) 20 MG tablet Take 1 tablet (20 mg total) by mouth 2 (two) times daily for 14 days. 07/25/23 08/08/23  Sonnie Dusky, PA-C  FLUoxetine  (PROZAC ) 20 MG capsule Take 1 capsule by mouth daily. 03/23/23   [provider]  HYDROcodone -acetaminophen  (NORCO/VICODIN) 5-325 MG tablet Take 1 tablet by mouth every 6 (six) hours as needed for moderate pain (pain score 4-6) or severe pain (pain score 7-10). 07/23/23   Hoyt Macleod, MD  hydrOXYzine  (ATARAX ) 25 MG tablet Take 25 mg by mouth in the morning and at bedtime.  05/14/23   [provider]  Insulin  Aspart FlexPen (NOVOLOG ) 100 UNIT/ML Inject 4 Units into the skin 3 (three) times daily. 05/10/23 07/20/23  [provider]  lamoTRIgine  (LAMICTAL ) 200 MG tablet Take 200 mg by mouth daily. 07/11/23   [provider]  LANTUS  SOLOSTAR 100 UNIT/ML Solostar Pen Inject 8 Units into the skin daily.    [provider]  LORazepam  (ATIVAN ) 1 MG tablet Take 1 mg by mouth daily as needed. 07/11/23   [provider]  losartan  (COZAAR ) 100 MG tablet Take 1 tablet by mouth daily. 03/25/23   [provider]  ondansetron  (ZOFRAN -ODT) 4 MG disintegrating tablet Take 1 tablet (4 mg total) by mouth every 8 (eight) hours as needed for nausea or vomiting. 06/23/23   Davis, Jonathon H, MD  polyethylene glycol (MIRALAX  / GLYCOLAX ) 17 g packet Take 17 g by mouth daily as needed for mild constipation. 12/07/22   [provider]  rosuvastatin  (CRESTOR ) 40 MG tablet Take 40 mg by mouth daily. 05/22/22   [provider]  sevelamer  carbonate (RENVELA ) 800 MG tablet Take 2,400 mg by mouth 3 (three) times daily with meals.    [provider]  SUMAtriptan  (IMITREX ) 50 MG tablet Take 50 mg by mouth every 2 (two) hours as needed for migraine or headache. 06/20/22   [provider]      Allergies    Cephalexin , Morphine , Penicillins, Benadryl  [diphenhydramine ], Dilaudid  [hydromorphone ], Doxycycline , Methotrexate derivatives, and  Oxycodone     Review of Systems   Review of Systems  Gastrointestinal:  Positive for abdominal pain.    Physical Exam Updated Vital Signs BP (!) 161/102   Pulse 84   Temp 98.1 F (36.7 C) (Oral)   Resp (!) 45   SpO2 98%  Physical Exam Vitals and nursing note reviewed.  Constitutional:      General: She is not in acute distress.    Appearance: She is not toxic-appearing.  HENT:     Head: Normocephalic.  Cardiovascular:     Rate and Rhythm: Normal rate and regular rhythm.   Pulmonary:     Effort: Pulmonary effort is normal.     Comments: Course breath sounds.  Abdominal:     General: Abdomen is protuberant.     Palpations: Abdomen is soft.     Tenderness: There is generalized abdominal tenderness (mild).  Skin:    General: Skin is warm and dry.     Capillary Refill: Capillary refill takes less than 2 seconds.  Neurological:     General: No focal deficit present.     Mental Status: She is alert.  Psychiatric:        Mood and Affect: Mood normal.        Behavior: Behavior normal.     ED Results / Procedures / Treatments   Labs (all labs ordered are listed, but only abnormal results are displayed) Labs Reviewed  CBC - Abnormal; Notable for the following components:      Result Value   MCHC 29.0 (*)    RDW 18.7 (*)    All other components within normal limits  BLOOD GAS, VENOUS - Abnormal; Notable for the following components:   pCO2, Ven 35 (*)    pO2, Ven 59 (*)    Acid-base deficit 4.4 (*)    All other components within normal limits  BETA-HYDROXYBUTYRIC ACID - Abnormal; Notable for the following components:   Beta-Hydroxybutyric Acid 0.30 (*)    All other components within normal limits  COMPREHENSIVE METABOLIC PANEL WITH GFR - Abnormal; Notable for the following components:   Sodium 129 (*)    Potassium 5.4 (*)    Chloride 96 (*)    CO2 15 (*)    Glucose, Bld 1,022 (*)    BUN 46 (*)    Creatinine, Ser 6.00 (*)    Calcium  7.5 (*)    Total Protein 6.2 (*)    Albumin 3.2 (*)    AST 46 (*)    ALT 49 (*)    Alkaline Phosphatase 362 (*)    GFR, Estimated 9 (*)    Anion gap 18 (*)    All other components within normal limits  CBG MONITORING, ED - Abnormal; Notable for the following components:   Glucose-Capillary >600 (*)    All other components within normal limits  CBG MONITORING, ED - Abnormal; Notable for the following components:   Glucose-Capillary >600 (*)    All other components within normal limits  LIPASE, BLOOD  HCG,  SERUM, QUALITATIVE  OSMOLALITY  BASIC METABOLIC PANEL WITH GFR  BASIC METABOLIC PANEL WITH GFR    EKG EKG Interpretation Date/Time:  Monday August 26 2023 21:54:35 EDT Ventricular Rate:  83 PR Interval:  140 QRS Duration:  82 QT Interval:  452 QTC Calculation: 532 R Axis:   46  Text Interpretation: Sinus rhythm Prolonged QT interval Confirmed by Elise Guile [phone]) on 08/26/2023 10:38:15 PM  Radiology DG Chest Portable 1 View Result Date:  08/26/2023 CLINICAL DATA:  Coughing, dialysis patient, hyperglycemia EXAM: PORTABLE CHEST 1 VIEW COMPARISON:  08/14/2023 FINDINGS: Stable cardiomegaly. Interstitial coarsening greatest in the lower lungs. Hazy ground-glass opacities throughout the right lung greatest in the right lower lung. No pleural effusion or pneumothorax. IMPRESSION: Hazy ground-glass opacities throughout the right lung greatest in the right lower lung. Lower lung interstitial thickening. Findings may be due to edema or infection. Electronically Signed   By: Rozell Cornet M.D.   On: 08/26/2023 22:13    Procedures .Critical Care  Performed by: Rolinda Climes, DO Authorized by: Rolinda Climes, DO   Critical care provider statement:    Critical care time (minutes):  30   Critical care was time spent personally by me on the following activities:  Development of treatment plan with patient or surrogate, discussions with consultants, evaluation of patient's response to treatment, examination of patient, ordering and review of laboratory studies, ordering and review of radiographic studies, ordering and performing treatments and interventions, pulse oximetry, re-evaluation of patient's condition and review of old charts     Medications Ordered in ED Medications  insulin  regular, human (MYXREDLIN ) 100 units/ 100 mL infusion (has no administration in time range)  lactated ringers  infusion (has no administration in time range)  dextrose  5 % in lactated ringers  infusion (0 mLs  Intravenous Hold 08/26/23 2330)  dextrose  50 % solution 0-50 mL (has no administration in time range)  fentaNYL  (SUBLIMAZE ) injection 25 mcg (has no administration in time range)  insulin  aspart (novoLOG ) injection 8 Units (8 Units Subcutaneous Given 08/26/23 2215)    ED Course/ Medical Decision Making/ A&P Clinical Course as of 08/26/23 2338  Mon Aug 26, 2023  2218 DG Chest Portable 1 View IMPRESSION: Hazy ground-glass opacities throughout the right lung greatest in the right lower lung. Lower lung interstitial thickening. Findings may be due to edema or infection.   [TY]  2238 EKG 12-Lead Prolonged QTC on my review.  [TY]    Clinical Course User Index [TY] Rolinda Climes, DO                                 Medical Decision Making This is a 33 year old female with complex past medical history to include type 1 diabetes, ESRD on dialysis Tuesday Thursday Saturday last dialysis Saturday, hypertension, bipolar, heart failure.  She presenting the emergency department with generalized abdominal pain.  Reassuring abdominal exam. Reports compliance with her home insulin , however is in HHS/DKA with blood sugar 1000, anion gap of 18, bicarb of 15.  However pH is 7.37, does have an elevated beta hydroxybutyrate 0.30 all consistent with HHS/DKA type picture.  She has no leukocytosis to suggest systemic infection.  Her abdominal pain I suspect is secondary to her HHS/DKA rather than acute surgical pathology.  She has no transaminitis to suggest hepatobiliary disease.  No elevated bilirubin.  Lipase is normal.  Pancreatitis is unlikely.  Patient started on insulin  drip.  Plan to admit for further management of HHS/DKA  Amount and/or Complexity of Data Reviewed Independent Historian:     Details: EMS reported elevated BS.  External Data Reviewed:     Details: Recent Nissen for cholecystectomy. Labs: ordered. Decision-making details documented in ED Course. Radiology: ordered and independent  interpretation performed. Decision-making details documented in ED Course.    Details: Chest x-ray with some minor edema.  She is due for dialysis tomorrow. ECG/medicine tests: ordered and independent  interpretation performed. Decision-making details documented in ED Course.    Details: Prolonged QTc. Discussion of management or test interpretation with external provider(s): Hospitalist  Risk Prescription drug management. Decision regarding hospitalization.          Final Clinical Impression(s) / ED Diagnoses Final diagnoses:  Diabetic ketoacidosis without coma associated with type 1 diabetes mellitus [facility])    Rx / DC Orders ED Discharge Orders     None         Rolinda Climes, DO 08/26/23 2338

## 2023-08-27 ENCOUNTER — Observation Stay (HOSPITAL_COMMUNITY)

## 2023-08-27 ENCOUNTER — Encounter (HOSPITAL_COMMUNITY): Payer: Self-pay | Admitting: Internal Medicine

## 2023-08-27 DIAGNOSIS — R188 Other ascites: Secondary | ICD-10-CM | POA: Diagnosis not present

## 2023-08-27 DIAGNOSIS — N2581 Secondary hyperparathyroidism of renal origin: Secondary | ICD-10-CM | POA: Diagnosis not present

## 2023-08-27 DIAGNOSIS — E1010 Type 1 diabetes mellitus with ketoacidosis without coma: Secondary | ICD-10-CM | POA: Diagnosis not present

## 2023-08-27 DIAGNOSIS — I5042 Chronic combined systolic (congestive) and diastolic (congestive) heart failure: Secondary | ICD-10-CM | POA: Diagnosis not present

## 2023-08-27 DIAGNOSIS — Z992 Dependence on renal dialysis: Secondary | ICD-10-CM | POA: Diagnosis not present

## 2023-08-27 DIAGNOSIS — E785 Hyperlipidemia, unspecified: Secondary | ICD-10-CM | POA: Diagnosis not present

## 2023-08-27 DIAGNOSIS — Z880 Allergy status to penicillin: Secondary | ICD-10-CM | POA: Diagnosis not present

## 2023-08-27 DIAGNOSIS — E875 Hyperkalemia: Secondary | ICD-10-CM | POA: Diagnosis not present

## 2023-08-27 DIAGNOSIS — E10649 Type 1 diabetes mellitus with hypoglycemia without coma: Secondary | ICD-10-CM | POA: Diagnosis not present

## 2023-08-27 DIAGNOSIS — R1084 Generalized abdominal pain: Secondary | ICD-10-CM | POA: Diagnosis present

## 2023-08-27 DIAGNOSIS — Z794 Long term (current) use of insulin: Secondary | ICD-10-CM | POA: Diagnosis not present

## 2023-08-27 DIAGNOSIS — Z881 Allergy status to other antibiotic agents status: Secondary | ICD-10-CM | POA: Diagnosis not present

## 2023-08-27 DIAGNOSIS — F319 Bipolar disorder, unspecified: Secondary | ICD-10-CM | POA: Diagnosis not present

## 2023-08-27 DIAGNOSIS — I132 Hypertensive heart and chronic kidney disease with heart failure and with stage 5 chronic kidney disease, or end stage renal disease: Secondary | ICD-10-CM | POA: Diagnosis not present

## 2023-08-27 DIAGNOSIS — K766 Portal hypertension: Secondary | ICD-10-CM | POA: Diagnosis not present

## 2023-08-27 DIAGNOSIS — N186 End stage renal disease: Secondary | ICD-10-CM | POA: Diagnosis not present

## 2023-08-27 DIAGNOSIS — R739 Hyperglycemia, unspecified: Secondary | ICD-10-CM

## 2023-08-27 DIAGNOSIS — I428 Other cardiomyopathies: Secondary | ICD-10-CM | POA: Diagnosis not present

## 2023-08-27 DIAGNOSIS — D631 Anemia in chronic kidney disease: Secondary | ICD-10-CM | POA: Diagnosis not present

## 2023-08-27 DIAGNOSIS — E1022 Type 1 diabetes mellitus with diabetic chronic kidney disease: Secondary | ICD-10-CM | POA: Diagnosis not present

## 2023-08-27 LAB — BASIC METABOLIC PANEL WITH GFR
Anion gap: 14 (ref 5–15)
Anion gap: 12 (ref 5–15)
Anion gap: 14 (ref 5–15)
BUN: 49 mg/dL — ABNORMAL HIGH (ref 6–20)
BUN: 46 mg/dL — ABNORMAL HIGH (ref 6–20)
BUN: 48 mg/dL — ABNORMAL HIGH (ref 6–20)
CO2: 20 mmol/L — ABNORMAL LOW (ref 22–32)
CO2: 24 mmol/L (ref 22–32)
CO2: 20 mmol/L — ABNORMAL LOW (ref 22–32)
Calcium: 7.8 mg/dL — ABNORMAL LOW (ref 8.9–10.3)
Calcium: 7.7 mg/dL — ABNORMAL LOW (ref 8.9–10.3)
Calcium: 7.6 mg/dL — ABNORMAL LOW (ref 8.9–10.3)
Chloride: 100 mmol/L (ref 98–111)
Chloride: 103 mmol/L (ref 98–111)
Chloride: 102 mmol/L (ref 98–111)
Creatinine, Ser: 6.35 mg/dL — ABNORMAL HIGH (ref 0.44–1.00)
Creatinine, Ser: 6.1 mg/dL — ABNORMAL HIGH (ref 0.44–1.00)
Creatinine, Ser: 6.32 mg/dL — ABNORMAL HIGH (ref 0.44–1.00)
GFR, Estimated: 8 mL/min — ABNORMAL LOW (ref >60)
GFR, Estimated: 9 mL/min — ABNORMAL LOW (ref >60)
GFR, Estimated: 8 mL/min — ABNORMAL LOW (ref >60)
Glucose, Bld: 530 mg/dL (ref 70–99)
Glucose, Bld: 149 mg/dL — ABNORMAL HIGH (ref 70–99)
Glucose, Bld: 158 mg/dL — ABNORMAL HIGH (ref 70–99)
Potassium: 4.3 mmol/L (ref 3.5–5.1)
Potassium: 4.3 mmol/L (ref 3.5–5.1)
Potassium: 4.6 mmol/L (ref 3.5–5.1)
Sodium: 134 mmol/L — ABNORMAL LOW (ref 135–145)
Sodium: 139 mmol/L (ref 135–145)
Sodium: 136 mmol/L (ref 135–145)

## 2023-08-27 LAB — CBG MONITORING, ED
Glucose-Capillary: 103 mg/dL — ABNORMAL HIGH (ref 70–99)
Glucose-Capillary: 104 mg/dL — ABNORMAL HIGH (ref 70–99)
Glucose-Capillary: 124 mg/dL — ABNORMAL HIGH (ref 70–99)
Glucose-Capillary: 13 mg/dL — CL (ref 70–99)
Glucose-Capillary: 138 mg/dL — ABNORMAL HIGH (ref 70–99)
Glucose-Capillary: 143 mg/dL — ABNORMAL HIGH (ref 70–99)
Glucose-Capillary: 144 mg/dL — ABNORMAL HIGH (ref 70–99)
Glucose-Capillary: 153 mg/dL — ABNORMAL HIGH (ref 70–99)
Glucose-Capillary: 187 mg/dL — ABNORMAL HIGH (ref 70–99)
Glucose-Capillary: 259 mg/dL — ABNORMAL HIGH (ref 70–99)
Glucose-Capillary: 369 mg/dL — ABNORMAL HIGH (ref 70–99)
Glucose-Capillary: 486 mg/dL — ABNORMAL HIGH (ref 70–99)
Glucose-Capillary: 548 mg/dL (ref 70–99)
Glucose-Capillary: 80 mg/dL (ref 70–99)
Glucose-Capillary: 82 mg/dL (ref 70–99)
Glucose-Capillary: 90 mg/dL (ref 70–99)
Glucose-Capillary: >600 mg/dL (ref 70–99)
Glucose-Capillary: >600 mg/dL (ref 70–99)

## 2023-08-27 LAB — CBC
HCT: 41.5 % (ref 36.0–46.0)
Hemoglobin: 12.2 g/dL (ref 12.0–15.0)
MCH: 27.2 pg (ref 26.0–34.0)
MCHC: 29.4 g/dL — ABNORMAL LOW (ref 30.0–36.0)
MCV: 92.4 fL (ref 80.0–100.0)
Platelets: 189 10*3/uL (ref 150–400)
RBC: 4.49 MIL/uL (ref 3.87–5.11)
RDW: 18.4 % — ABNORMAL HIGH (ref 11.5–15.5)
WBC: 9.1 10*3/uL (ref 4.0–10.5)
nRBC: 0 % (ref 0.0–0.2)

## 2023-08-27 LAB — GLUCOSE, CAPILLARY: Glucose-Capillary: 302 mg/dL — ABNORMAL HIGH (ref 70–99)

## 2023-08-27 LAB — OSMOLALITY: Osmolality: 320 mosm/kg — ABNORMAL HIGH (ref 275–295)

## 2023-08-27 LAB — HCG, SERUM, QUALITATIVE: Preg, Serum: NEGATIVE

## 2023-08-27 LAB — HEMOGLOBIN A1C
Hgb A1c MFr Bld: 10.3 % — ABNORMAL HIGH (ref 4.8–5.6)
Mean Plasma Glucose: 248.91 mg/dL

## 2023-08-27 LAB — MAGNESIUM: Magnesium: 2.4 mg/dL (ref 1.7–2.4)

## 2023-08-27 MED ORDER — DEXTROSE IN LACTATED RINGERS 5 % IV SOLN: INTRAVENOUS | Status: DC

## 2023-08-27 MED ORDER — NALOXONE HCL 0.4 MG/ML IJ SOLN: 0.4000 mg | INTRAMUSCULAR | Status: DC | PRN

## 2023-08-27 MED ORDER — INSULIN ASPART 100 UNIT/ML IJ SOLN
0.0000 [IU] | Freq: Every day | INTRAMUSCULAR | Status: DC
  Administered 2023-08-27: 4 [IU] via SUBCUTANEOUS
  Filled 2023-08-27: qty 0.05

## 2023-08-27 MED ORDER — ACETAMINOPHEN 650 MG RE SUPP: 650.0000 mg | Freq: Four times a day (QID) | RECTAL | Status: DC | PRN

## 2023-08-27 MED ORDER — FENTANYL CITRATE PF 50 MCG/ML IJ SOSY
25.0000 ug | PREFILLED_SYRINGE | INTRAMUSCULAR | Status: DC | PRN
  Administered 2023-08-27 – 2023-08-29 (×10): 25 ug via INTRAVENOUS
  Filled 2023-08-27 (×10): qty 1

## 2023-08-27 MED ORDER — ACETAMINOPHEN 325 MG PO TABS: 650.0000 mg | ORAL_TABLET | Freq: Four times a day (QID) | ORAL | Status: DC | PRN

## 2023-08-27 MED ORDER — INSULIN GLARGINE-YFGN 100 UNIT/ML ~~LOC~~ SOLN
10.0000 [IU] | Freq: Every day | SUBCUTANEOUS | Status: DC
Start: 2023-08-27 — End: 2023-08-28
  Administered 2023-08-27: 10 [IU] via SUBCUTANEOUS
  Filled 2023-08-27 (×3): qty 0.1

## 2023-08-27 MED ORDER — INSULIN ASPART 100 UNIT/ML IJ SOLN
0.0000 [IU] | Freq: Three times a day (TID) | INTRAMUSCULAR | Status: DC
  Filled 2023-08-27: qty 0.09

## 2023-08-27 MED ORDER — FENTANYL CITRATE PF 50 MCG/ML IJ SOSY
50.0000 ug | PREFILLED_SYRINGE | Freq: Once | INTRAMUSCULAR | Status: AC
  Administered 2023-08-27: 50 ug via INTRAVENOUS
  Filled 2023-08-27: qty 1

## 2023-08-27 MED ORDER — HYDROXYZINE HCL 10 MG PO TABS
10.0000 mg | ORAL_TABLET | Freq: Three times a day (TID) | ORAL | Status: DC | PRN
  Administered 2023-08-27 – 2023-09-02 (×13): 10 mg via ORAL
  Filled 2023-08-27 (×14): qty 1

## 2023-08-27 NOTE — ED Notes (Signed)
 Per Dr.Pudota , continue treating patient hyperglycemia using endo tool until BMP results and further evaluation.

## 2023-08-27 NOTE — ED Notes (Signed)
 Report called over to [facility] and [facility] contacted for pt transport.

## 2023-08-27 NOTE — H&P (Signed)
 History and Physical    Ashley Freeman EAV:[id number] DOB: 1991-03-15 DOA: 08/26/2023  PCP: Skip Dull Pap, MD  Patient coming from: Home  Chief Complaint: Abdominal pain  HPI: Ashley Freeman is a 33 y.o. female with medical history significant of ESRD on HD TTS, chronic HFrEF, type 1 diabetes, hypertension, hyperlipidemia, QT prolongation, bipolar disorder, depression.  She was admitted last month 3/21-3/25 for abdominal pain, nausea, and vomiting in the setting of recent cholecystectomy.  CT was concerning for postop small fluid collection.  General surgery consulted and felt that there were no signs of postoperative complication.    Patient presents to the ED for evaluation of generalized abdominal pain and hyperglycemia.  CBG 470 with EMS.  Vital signs on arrival: Temperature 98.1 F, pulse 82, respiratory rate 35, blood pressure 158/116, and SpO2 96% on room air.  Labs notable for WBC count 6.8, hemoglobin 12.1, sodium 129, potassium 5.4, chloride 96, glucose 1022, bicarb 15, anion gap 18, calcium  7.5, albumin 3.2, transaminases and alkaline phosphatase elevated but improved compared to labs done a month ago, T. bili normal, lipase normal, VBG with pH 7.37, beta hydroxybutyric acid 0.3, serum osmolarity pending, serum hCG negative.  Chest x-ray showing hazy groundglass opacities throughout the right lung, greatest in the right lower lung with lower lung interstitial thickening.  Findings suspicious for pulmonary edema versus infection. Patient was given fentanyl , NovoLog  8 units, and subsequently started on insulin  drip and IV fluids.  TRH called to admit.  Patient is complaining of severe generalized abdominal pain and distention.  Denies missing any dialysis treatments.  States her nephrologist was concerned that she had too much fluid in her abdomen and had scheduled her for paracentesis but it has not been done yet.  Denies nausea or vomiting.  She is having  regular bowel movements.  She is also endorsing shortness of breath.  Denies fevers, chills, or chest pain.  She denies missing any doses of her home insulin  and denies increased dietary carbohydrate intake.  She is taking Lantus  10 units daily and NovoLog  5 units with meals.  Review of Systems:  Review of Systems  All other systems reviewed and are negative.   Past Medical History:  Diagnosis Date   Abscess, gluteal, right 08/24/2013   Anemia 02/19/2012   Bartholin's gland abscess 09/19/2013   Bipolar disorder (HCC)    BV (bacterial vaginosis) 11/24/2015   Depression    Diabetes mellitus type I (HCC) 2001   Diagnosed at age 75 ; Type I   Diarrhea 05/30/2016   DKA (diabetic ketoacidoses) 08/19/2013   Also in 2018   ESRD (end stage renal disease) (HCC)    Gonorrhea 08/2011   Treated in 09/2011   HFrEF (heart failure with reduced ejection fraction) (HCC)    a. 2022 Echo: EF 40%; b. 10/2021 Echo: EF 55%; b. 07/2022 MV: No ischemia. EF 31%; c. 08/2022 Echo: EF 35%, mildly dil RV, sev TR.   History of trichomoniasis 05/31/2016   Hyperlipidemia 03/28/2016   Hypertension    NICM (nonischemic cardiomyopathy) (HCC)    Sepsis (HCC) 09/19/2013    Past Surgical History:  Procedure Laterality Date   A/V FISTULAGRAM Right 06/17/2023   Procedure: A/V Fistulagram;  Surgeon: Celso College, MD;  Location: [facility] INVASIVE CV LAB;  Service: Cardiovascular;  Laterality: Right;   AV FISTULA PLACEMENT Right 07/06/2022   Procedure: ARTERIOVENOUS GRAFT CREATION;  Surgeon: Young Hensen, MD;  Location: [facility] OR;  Service: Vascular;  Laterality: Right;  CESAREAN SECTION N/A 10/05/2019   Procedure: CESAREAN SECTION;  Surgeon: Raynell Caller, MD;  Location: [facility] LD ORS;  Service: Obstetrics;  Laterality: N/A;   CHOLECYSTECTOMY N/A 07/02/2023   Procedure: LAPAROSCOPIC CHOLECYSTECTOMY;  Surgeon: Enid Harry, MD;  Location: [facility] OR;  Service: General;  Laterality: N/A;   INCISION AND DRAINAGE ABSCESS  Left 09/28/2019   Procedure: INCISION AND DRAINAGE VULVAR ABCESS;  Surgeon: Albino Hum, MD;  Location: [facility] OR;  Service: Gynecology;  Laterality: Left;   INCISION AND DRAINAGE PERIRECTAL ABSCESS Right 08/18/2013   Procedure: IRRIGATION AND DEBRIDEMENT GLUTEAL ABSCESS;  Surgeon: Shela Derby, MD;  Location: [facility] OR;  Service: General;  Laterality: Right;   INCISION AND DRAINAGE PERIRECTAL ABSCESS Right 09/19/2013   Procedure: IRRIGATION AND DEBRIDEMENT RIGHT GLUTEAL AND LABIAL ABSCESSES;  Surgeon: Shela Derby, MD;  Location: [facility] OR;  Service: General;  Laterality: Right;   INCISION AND DRAINAGE PERIRECTAL ABSCESS Right 09/24/2013   Procedure: IRRIGATION AND DEBRIDEMENT PERIRECTAL ABSCESS;  Surgeon: Diantha Fossa, MD;  Location: [facility] OR;  Service: General;  Laterality: Right;     reports that she has never smoked. She has never been exposed to tobacco smoke. She has never used smokeless tobacco. She reports that she does not currently use alcohol. She reports that she does not use drugs.  Allergies  Allergen Reactions   Cephalexin  Anaphylaxis    Has gotten ceftriaxone  in the past    Morphine  Itching   Penicillins Hives and Rash   Benadryl  [Diphenhydramine ] Itching   Dilaudid  [Hydromorphone ] Itching   Doxycycline  Itching   Methotrexate Derivatives Rash   Oxycodone  Itching    Family History  Problem Relation Age of Onset   Asthma Mother    Carpal tunnel syndrome Mother    Gout Father    Diabetes Paternal Grandmother    Anesthesia problems Neg Hx     Prior to Admission medications   Medication Sig Start Date End Date Taking? Authorizing Provider  acetaminophen  (TYLENOL ) 500 MG tablet Take 1,000 mg by mouth as needed for mild pain (pain score 1-3) or moderate pain (pain score 4-6).   Yes [provider]  albuterol  (VENTOLIN  HFA) 108 (90 Base) MCG/ACT inhaler Inhale 2 puffs into the lungs every 4 (four) hours as needed for wheezing or shortness of breath. 11/24/22  Yes  [provider]  bumetanide (BUMEX) 2 MG tablet Take 2 mg by mouth daily.   Yes [provider]  calcitRIOL  (ROCALTROL ) 0.25 MCG capsule Take 5 capsules (1.25 mcg total) by mouth every Tuesday, Thursday, and Saturday at 6 PM. 07/09/23  Yes Oral Billings, MD  carvedilol  (COREG ) 25 MG tablet Take 25 mg by mouth 2 (two) times daily with a meal. 05/15/23  Yes [provider]  DULoxetine (CYMBALTA) 20 MG capsule Take 20 mg by mouth daily. 07/29/23  Yes [provider]  ENTRESTO 24-26 MG Take 0.5 tablets by mouth 2 (two) times daily. 07/29/23  Yes [provider]  famotidine  (PEPCID ) 20 MG tablet Take 20 mg by mouth 2 (two) times daily. 07/29/23  Yes [provider]  hydrocortisone  2.5 % cream Apply 1 Application topically daily as needed. 12/28/22 12/28/23 Yes [provider]  hydrOXYzine  (ATARAX ) 25 MG tablet Take 25 mg by mouth every 6 (six) hours as needed for anxiety or itching. 05/14/23  Yes [provider]  insulin  aspart (NOVOLOG ) 100 UNIT/ML injection Inject 5 Units into the skin 3 (three) times daily as needed for high blood sugar.   Yes  [provider]  LANTUS  SOLOSTAR 100 UNIT/ML Solostar Pen Inject 10 Units into the skin at bedtime.   Yes [provider]  LORazepam  (ATIVAN ) 1 MG tablet Take 1 mg by mouth daily as needed. 07/11/23  Yes [provider]  losartan  (COZAAR ) 100 MG tablet Take 150 mg by mouth daily. 03/25/23  Yes [provider]  methylphenidate 18 MG PO CR tablet Take 18 mg by mouth every morning. 08/21/23  Yes [provider]  ondansetron  (ZOFRAN -ODT) 4 MG disintegrating tablet Take 1 tablet (4 mg total) by mouth every 8 (eight) hours as needed for nausea or vomiting. 06/23/23  Yes Davis, Jonathon H, MD  polyethylene glycol (MIRALAX  / GLYCOLAX ) 17 g packet Take 17 g by mouth daily as needed for mild constipation. 12/07/22  Yes [provider]  rosuvastatin  (CRESTOR )  40 MG tablet Take 40 mg by mouth daily. 05/22/22  Yes [provider]  sevelamer  carbonate (RENVELA ) 800 MG tablet Take 2,400 mg by mouth 3 (three) times daily with meals.   Yes [provider]  sodium bicarbonate  650 MG tablet Take 1,300 mg by mouth 2 (two) times daily. 07/29/23  Yes [provider]  SUMAtriptan  (IMITREX ) 50 MG tablet Take 50 mg by mouth every 2 (two) hours as needed for migraine or headache. 06/20/22  Yes [provider]  Continuous Glucose Sensor (DEXCOM G7 SENSOR) MISC Change sensors every 10 days 09/07/22   Shamleffer, Ibtehal Jaralla, MD  dicyclomine  (BENTYL ) 20 MG tablet Take 1 tablet (20 mg total) by mouth 2 (two) times daily for 14 days. 07/25/23 08/08/23  Sonnie Dusky, PA-C  Insulin  Aspart FlexPen (NOVOLOG ) 100 UNIT/ML Inject 4 Units into the skin 3 (three) times daily. 05/10/23 07/20/23  [provider]  lamoTRIgine  (LAMICTAL ) 200 MG tablet Take 200 mg by mouth daily. 07/11/23   [provider]    Physical Exam: Vitals:   08/26/23 2315 08/27/23 0047 08/27/23 0115 08/27/23 0130  BP: (!) 161/102 (!) 176/127 (!) 177/125 (!) 180/126  Pulse: 84 83 91 85  Resp: (!) 45 (!) 28 (!) 31 (!) 22  Temp:    98.2 F (36.8 C)  TempSrc:      SpO2: 98% 95% 94% 100%    Physical Exam Vitals reviewed.  Constitutional:      Comments: Appears uncomfortable secondary to abdominal pain  HENT:     Head: Normocephalic and atraumatic.  Eyes:     Extraocular Movements: Extraocular movements intact.  Cardiovascular:     Rate and Rhythm: Normal rate and regular rhythm.     Pulses: Normal pulses.  Pulmonary:     Effort: Pulmonary effort is normal. No respiratory distress.     Breath sounds: Normal breath sounds. No wheezing or rales.  Abdominal:     General: There is distension.     Palpations: Abdomen is soft.     Comments: Hypoactive bowel sounds Abdomen significantly distended and tender even to minimal palpation  Musculoskeletal:      Cervical back: Normal range of motion.     Right lower leg: Edema present.     Left lower leg: Edema present.     Comments: Trace bilateral pedal edema  Skin:    General: Skin is warm and dry.  Neurological:     General: No focal deficit present.     Mental Status: She is alert and oriented to person, place, and time.     Labs on Admission: I have personally reviewed following labs and imaging  studies  CBC: Recent Labs  Lab 08/26/23 2228  WBC 6.8  HGB 12.1  HCT 41.7  MCV 94.6  PLT 159   Basic Metabolic Panel: Recent Labs  Lab 08/26/23 2228 08/27/23 0218  NA 129* 134*  K 5.4* 4.3  CL 96* 100  CO2 15* 20*  GLUCOSE 1,022* 530*  BUN 46* 49*  CREATININE 6.00* 6.35*  CALCIUM  7.5* 7.8*   GFR: Estimated Creatinine Clearance: 11.5 mL/min (A) (by C-G formula based on SCr of 6.35 mg/dL (H)). Liver Function Tests: Recent Labs  Lab 08/26/23 2228  AST 46*  ALT 49*  ALKPHOS 362*  BILITOT 1.2  PROT 6.2*  ALBUMIN 3.2*   Recent Labs  Lab 08/26/23 2228  LIPASE 27   No results for input(s): "AMMONIA" in the last 168 hours. Coagulation Profile: No results for input(s): "INR", "PROTIME" in the last 168 hours. Cardiac Enzymes: No results for input(s): "CKTOTAL", "CKMB", "CKMBINDEX", "TROPONINI" in the last 168 hours. BNP (last 3 results) No results for input(s): "PROBNP" in the last 8760 hours. HbA1C: No results for input(s): "HGBA1C" in the last 72 hours. CBG: Recent Labs  Lab 08/27/23 0115 08/27/23 0147 08/27/23 0220 08/27/23 0303 08/27/23 0353  GLUCAP >600* 548* 486* 369* 259*   Lipid Profile: No results for input(s): "CHOL", "HDL", "LDLCALC", "TRIG", "CHOLHDL", "LDLDIRECT" in the last 72 hours. Thyroid  Function Tests: No results for input(s): "TSH", "T4TOTAL", "FREET4", "T3FREE", "THYROIDAB" in the last 72 hours. Anemia Panel: No results for input(s): "VITAMINB12", "FOLATE", "FERRITIN", "TIBC", "IRON ", "RETICCTPCT" in the last 72 hours. Urine  analysis:    Component Value Date/Time   COLORURINE YELLOW 06/23/2023 1915   APPEARANCEUR HAZY (A) 06/23/2023 1915   APPEARANCEUR Hazy 11/10/2013 2043   LABSPEC 1.019 06/23/2023 1915   LABSPEC 1.031 11/10/2013 2043   PHURINE 8.0 06/23/2023 1915   GLUCOSEU 150 (A) 06/23/2023 1915   GLUCOSEU >=500 11/10/2013 2043   HGBUR NEGATIVE 06/23/2023 1915   BILIRUBINUR NEGATIVE 06/23/2023 1915   BILIRUBINUR negative 06/04/2018 1035   BILIRUBINUR Negative 11/24/2015 1443   BILIRUBINUR Negative 11/10/2013 2043   KETONESUR 5 (A) 06/23/2023 1915   PROTEINUR >=300 (A) 06/23/2023 1915   UROBILINOGEN 0.2 09/14/2019 1732   NITRITE NEGATIVE 06/23/2023 1915   LEUKOCYTESUR NEGATIVE 06/23/2023 1915   LEUKOCYTESUR 1+ 11/10/2013 2043    Radiological Exams on Admission: DG Chest Portable 1 View Result Date: 08/26/2023 CLINICAL DATA:  Coughing, dialysis patient, hyperglycemia EXAM: PORTABLE CHEST 1 VIEW COMPARISON:  08/14/2023 FINDINGS: Stable cardiomegaly. Interstitial coarsening greatest in the lower lungs. Hazy ground-glass opacities throughout the right lung greatest in the right lower lung. No pleural effusion or pneumothorax. IMPRESSION: Hazy ground-glass opacities throughout the right lung greatest in the right lower lung. Lower lung interstitial thickening. Findings may be due to edema or infection. Electronically Signed   By: Rozell Cornet M.D.   On: 08/26/2023 22:13    EKG: Independently reviewed.  Sinus rhythm, QTc 532.  No significant change since previous tracing.  Assessment and Plan  Hyperglycemia in the setting of poorly controlled type 1 diabetes ?HHS vs DKA Glucose 1022, bicarb 15, anion gap 18, VBG with pH 7.37, beta hydroxybutyric acid 0.3, serum osmolarity pending.  Last A1c 10.6 on 05/21/2023.  Keep n.p.o. and continue insulin  drip.  Very cautious with IV fluids given ESRD and chronic HFrEF.  CBG checks every 1 hour per Endo tool, BMP every 4 hours, and check A1c.  ESRD on HD  TTS Chronic HFrEF (EF 25-30%, grade 2 diastolic dysfunction per  echo 07/01/2023) Possible pulmonary edema Chest x-ray showing hazy groundglass opacities throughout the right lung, greatest in the right lower lung with lower lung interstitial thickening.  Findings suspicious for pulmonary edema versus infection.  No hypoxia or respiratory distress.  Pneumonia less likely given no fever or leukocytosis.  Rate of IV fluids for DKA has been decreased and monitor volume status very closely.  She is being admitted to [facility].  Consult nephrology in the morning for dialysis.  Abdominal pain History of cholecystectomy on 07/02/2023.  She was subsequently admitted to the hospital 3/21-3/25 for abdominal pain, nausea, and vomiting.  CT was showing postoperative small fluid collection for which patient was evaluated by general surgery and they felt that there were no signs of postoperative location.  At present, patient is complaining of severe generalized abdominal pain.  No fever or leukocytosis.  LFTs have improved compared tolabs a month ago and lipase is normal.  Serum hCG negative.  No nausea or vomiting.  However, on exam, her abdomen is significantly distended and tender even to minimal palpation.  She has hypoactive bowel sounds.  Keep n.p.o., stat CT abdomen pelvis ordered for further evaluation.  Continue pain management.  Pseudohyponatremia In the setting of hyperglycemia.  Continue to monitor labs.  Mild hyperkalemia On insulin  drip and monitor labs.  QT prolongation Monitor potassium and magnesium  levels.  Cardiac monitoring and avoid QT prolonging drugs.  Hypertension: SBP currently in the 160s.  IV hydralazine  PRN. Hyperlipidemia Depression, bipolar disorder Resume p.o. meds when patient is no longer NPO.  DVT prophylaxis: SCDs until CT abdomen pelvis is done Code Status: Full Code (discussed with the patient) Family Communication: No family available at this time. Level of  care: Progressive Care Unit Admission status: It is my clinical opinion that referral for OBSERVATION is reasonable and necessary in this patient based on the above information provided. The aforementioned taken together are felt to place the patient at high risk for further clinical deterioration. However, it is anticipated that the patient may be medically stable for discharge from the hospital within 24 to 48 hours.  Juliette Oh MD [facility]  If 7PM-7AM, please contact night-coverage [email or web address]  08/27/2023, 3:57 AM

## 2023-08-27 NOTE — ED Provider Notes (Signed)
 Patient awake and alert in no acute distress.  Patient will be admitted for DKA with history of end-stage renal disease Discussed with Dr. Michell Ahumada for admission She request that we will scale back IV fluids to the prevent any volume overload   Eldon Greenland, MD 08/27/23 [phone]

## 2023-08-27 NOTE — Inpatient Diabetes Management (Signed)
 Inpatient Diabetes Program Recommendations  AACE/ADA: New Consensus Statement on Inpatient Glycemic Control (2015)  Target Ranges:  Prepandial:   less than 140 mg/dL      Peak postprandial:   less than 180 mg/dL (1-2 hours)      Critically ill patients:  140 - 180 mg/dL   Lab Results  Component Value Date   GLUCAP 138 (H) 08/27/2023   HGBA1C 10.3 (H) 08/27/2023    Review of Glycemic Control  Diabetes history: DM1 Outpatient Diabetes medications: Lantus  10 at bedtime, Novolog  4 units TID. Has Dexcom Current orders for Inpatient glycemic control: Semglee  10 units at bedtime, Novolog  0-9 units TID with meals and 0-5 HS  HgbA1C - 10.3%  Inpatient Diabetes Program Recommendations:    Consider adding Novolog  3 units TID with meals if eating > 50%  Will see pt on 4/30.  Continue to follow.  Thank you. Joni Net, RD, LDN, CDCES Inpatient Diabetes Coordinator [phone]

## 2023-08-27 NOTE — ED Notes (Signed)
 Pt complaining of 8/10 pain all over

## 2023-08-27 NOTE — ED Notes (Signed)
 Placed bedside commode in room for pt, lightly assisted pt with standing up pt felt woozy

## 2023-08-27 NOTE — Progress Notes (Signed)
 Patient admitted for abdominal pain  and hyperglycemia. Patient is a dialysis patient of [facility] arrived into this unit at about 1945, patient remains alert and oriented x 4. Patient arrived into unit with SOB, cough and back pain, was made comfortably in bed , oriented to room prn medication given. Dr, Amy Kansky was contacted about patients complain  and dialysis order. No new order given, will continue to monitor

## 2023-08-27 NOTE — ED Notes (Signed)
 This nurse called phlebotomist to obtain blood work due to inability to pull from IV and 1 unsuccessful straight stick

## 2023-08-27 NOTE — ED Notes (Addendum)
 Disregard critical BGL of 13. Contaminated sample. Repeat/ clean sample obtained.

## 2023-08-27 NOTE — Progress Notes (Signed)
 This is a day 0 not after admission.  Patient was admitted for abdominal pain with hyperglycemia with significantly elevated blood sugars suggestive of DKA, fluid overload in the setting of end-stage renal disease.  Patient was seen and examined this morning.  Patient reports improvement in abdominal pain and is actually requesting that she eat something.  Patient also has been complaining of shortness of breath which is also improved currently on room air.  Review of BMP shows that the patient's anion gap is closed and blood sugars significantly improved.  Patient has been stopped of the Endo tool and initiated on basal bolus insulin  with carb controlled diet.   Hyperglycemia in the setting of poorly controlled type 1 diabetes ?HHS vs DKA Glucose 1022, bicarb 15, anion gap 18, VBG with pH 7.37, beta hydroxybutyric acid 0.3, serum osmolarity pending.  Last A1c 10.6 on 05/21/2023.  Keep n.p.o. and continue insulin  drip.  Very cautious with IV fluids given ESRD and chronic HFrEF.  CBG checks every 1 hour per Endo tool, BMP every 4 hours, and check A1c.  -Gap closed, bicarbonate 20 - Endo.  Started on basal bolus insulin    ESRD on HD TTS Chronic HFrEF (EF 25-30%, grade 2 diastolic dysfunction per echo 4/0/9811) Possible pulmonary edema Chest x-ray showing hazy groundglass opacities throughout the right lung, greatest in the right lower lung with lower lung interstitial thickening.  Findings suspicious for pulmonary edema versus infection.  No hypoxia or respiratory distress.  Pneumonia less likely given no fever or leukocytosis.  Rate of IV fluids for DKA has been decreased and monitor volume status very closely.  She is being admitted to [facility].  Consult nephrology in the morning for dialysis.   Abdominal pain History of cholecystectomy on 07/02/2023.  She was subsequently admitted to the hospital 3/21-3/25 for abdominal pain, nausea, and vomiting.  CT was showing postoperative small fluid  collection for which patient was evaluated by general surgery and they felt that there were no signs of postoperative location.  At present, patient is complaining of severe generalized abdominal pain.  No fever or leukocytosis.  LFTs have improved compared tolabs a month ago and lipase is normal.  Serum hCG negative.  No nausea or vomiting.  However, on exam, her abdomen is significantly distended and tender even to minimal palpation.  She has hypoactive bowel sounds.  Keep n.p.o., stat CT abdomen pelvis ordered for further evaluation.  CT confirms that the patient has large volume abdominal ascites.  Patient is on going to undergo hemodialysis we will reassess to see if this helps to reduce her ascites and pleural effusion.   Pseudohyponatremia In the setting of hyperglycemia.  Continue to monitor labs. - Current sodium level 136.   Mild hyperkalemia On insulin  drip and monitor labs.  Most recent potassium 4.6   QT prolongation Monitor potassium and magnesium  levels.  Cardiac monitoring and avoid QT prolonging drugs.   Hypertension: SBP currently in the 160s.  IV hydralazine  PRN. Hyperlipidemia Depression, bipolar disorder Resume p.o. meds when patient is no longer NPO.   DVT prophylaxis: SCDs until CT abdomen pelvis is done

## 2023-08-27 NOTE — ED Notes (Signed)
 Pt able to provide peri-care independently. Transferred self from bedside commode to bed w/o assist. No complaints

## 2023-08-28 ENCOUNTER — Encounter (HOSPITAL_COMMUNITY): Payer: Self-pay | Admitting: Internal Medicine

## 2023-08-28 ENCOUNTER — Inpatient Hospital Stay (HOSPITAL_COMMUNITY)

## 2023-08-28 DIAGNOSIS — N186 End stage renal disease: Secondary | ICD-10-CM

## 2023-08-28 DIAGNOSIS — Z881 Allergy status to other antibiotic agents status: Secondary | ICD-10-CM | POA: Diagnosis not present

## 2023-08-28 DIAGNOSIS — F319 Bipolar disorder, unspecified: Secondary | ICD-10-CM | POA: Diagnosis present

## 2023-08-28 DIAGNOSIS — D631 Anemia in chronic kidney disease: Secondary | ICD-10-CM | POA: Diagnosis present

## 2023-08-28 DIAGNOSIS — Z833 Family history of diabetes mellitus: Secondary | ICD-10-CM | POA: Diagnosis not present

## 2023-08-28 DIAGNOSIS — Z992 Dependence on renal dialysis: Secondary | ICD-10-CM | POA: Diagnosis not present

## 2023-08-28 DIAGNOSIS — I132 Hypertensive heart and chronic kidney disease with heart failure and with stage 5 chronic kidney disease, or end stage renal disease: Secondary | ICD-10-CM | POA: Diagnosis present

## 2023-08-28 DIAGNOSIS — G8929 Other chronic pain: Secondary | ICD-10-CM | POA: Diagnosis present

## 2023-08-28 DIAGNOSIS — N2581 Secondary hyperparathyroidism of renal origin: Secondary | ICD-10-CM | POA: Diagnosis present

## 2023-08-28 DIAGNOSIS — E785 Hyperlipidemia, unspecified: Secondary | ICD-10-CM | POA: Diagnosis present

## 2023-08-28 DIAGNOSIS — E875 Hyperkalemia: Secondary | ICD-10-CM | POA: Diagnosis present

## 2023-08-28 DIAGNOSIS — Z880 Allergy status to penicillin: Secondary | ICD-10-CM | POA: Diagnosis not present

## 2023-08-28 DIAGNOSIS — R9431 Abnormal electrocardiogram [ECG] [EKG]: Secondary | ICD-10-CM | POA: Diagnosis present

## 2023-08-28 DIAGNOSIS — K766 Portal hypertension: Secondary | ICD-10-CM | POA: Diagnosis present

## 2023-08-28 DIAGNOSIS — R188 Other ascites: Secondary | ICD-10-CM | POA: Diagnosis present

## 2023-08-28 DIAGNOSIS — E1022 Type 1 diabetes mellitus with diabetic chronic kidney disease: Secondary | ICD-10-CM | POA: Diagnosis present

## 2023-08-28 DIAGNOSIS — I5042 Chronic combined systolic (congestive) and diastolic (congestive) heart failure: Secondary | ICD-10-CM

## 2023-08-28 DIAGNOSIS — I428 Other cardiomyopathies: Secondary | ICD-10-CM | POA: Diagnosis present

## 2023-08-28 DIAGNOSIS — Z9049 Acquired absence of other specified parts of digestive tract: Secondary | ICD-10-CM | POA: Diagnosis not present

## 2023-08-28 DIAGNOSIS — R1084 Generalized abdominal pain: Secondary | ICD-10-CM | POA: Diagnosis present

## 2023-08-28 DIAGNOSIS — E1165 Type 2 diabetes mellitus with hyperglycemia: Secondary | ICD-10-CM

## 2023-08-28 DIAGNOSIS — M545 Low back pain, unspecified: Secondary | ICD-10-CM | POA: Diagnosis present

## 2023-08-28 DIAGNOSIS — E1010 Type 1 diabetes mellitus with ketoacidosis without coma: Secondary | ICD-10-CM | POA: Diagnosis present

## 2023-08-28 DIAGNOSIS — E10649 Type 1 diabetes mellitus with hypoglycemia without coma: Secondary | ICD-10-CM | POA: Diagnosis not present

## 2023-08-28 DIAGNOSIS — Z794 Long term (current) use of insulin: Secondary | ICD-10-CM | POA: Diagnosis not present

## 2023-08-28 DIAGNOSIS — Z885 Allergy status to narcotic agent status: Secondary | ICD-10-CM | POA: Diagnosis not present

## 2023-08-28 HISTORY — PX: IR PARACENTESIS: IMG2679

## 2023-08-28 LAB — BODY FLUID CELL COUNT WITH DIFFERENTIAL
Eos, Fluid: 0 %
Lymphs, Fluid: 53 %
Monocyte-Macrophage-Serous Fluid: 46 % — ABNORMAL LOW (ref 50–90)
Neutrophil Count, Fluid: 1 % (ref 0–25)
Total Nucleated Cell Count, Fluid: 306 uL (ref 0–1000)

## 2023-08-28 LAB — GLUCOSE, CAPILLARY
Glucose-Capillary: 38 mg/dL — CL (ref 70–99)
Glucose-Capillary: 49 mg/dL — ABNORMAL LOW (ref 70–99)
Glucose-Capillary: 129 mg/dL — ABNORMAL HIGH (ref 70–99)
Glucose-Capillary: 162 mg/dL — ABNORMAL HIGH (ref 70–99)
Glucose-Capillary: 48 mg/dL — ABNORMAL LOW (ref 70–99)
Glucose-Capillary: 133 mg/dL — ABNORMAL HIGH (ref 70–99)
Glucose-Capillary: 159 mg/dL — ABNORMAL HIGH (ref 70–99)
Glucose-Capillary: 175 mg/dL — ABNORMAL HIGH (ref 70–99)

## 2023-08-28 LAB — ALBUMIN, PLEURAL OR PERITONEAL FLUID: Albumin, Fluid: 2.1 g/dL

## 2023-08-28 LAB — MRSA NEXT GEN BY PCR, NASAL: MRSA by PCR Next Gen: NOT DETECTED

## 2023-08-28 LAB — ALBUMIN: Albumin: 3.3 g/dL — ABNORMAL LOW (ref 3.5–5.0)

## 2023-08-28 MED ORDER — LIDOCAINE-PRILOCAINE 2.5-2.5 % EX CREA: 1.0000 | TOPICAL_CREAM | CUTANEOUS | Status: DC | PRN

## 2023-08-28 MED ORDER — INSULIN GLARGINE-YFGN 100 UNIT/ML ~~LOC~~ SOLN
4.0000 [IU] | Freq: Every day | SUBCUTANEOUS | Status: DC
Start: 2023-08-28 — End: 2023-08-29
  Administered 2023-08-29: 4 [IU] via SUBCUTANEOUS
  Filled 2023-08-28 (×2): qty 0.04

## 2023-08-28 MED ORDER — HEPARIN SODIUM (PORCINE) 1000 UNIT/ML DIALYSIS: 1500.0000 [IU] | INTRAMUSCULAR | Status: DC | PRN

## 2023-08-28 MED ORDER — HEPARIN SODIUM (PORCINE) 1000 UNIT/ML DIALYSIS: 2500.0000 [IU] | Freq: Once | INTRAMUSCULAR | Status: DC

## 2023-08-28 MED ORDER — ROSUVASTATIN CALCIUM 5 MG PO TABS
10.0000 mg | ORAL_TABLET | Freq: Every day | ORAL | Status: DC
  Administered 2023-08-28 – 2023-09-01 (×5): 10 mg via ORAL
  Filled 2023-08-28 (×4): qty 2
  Filled 2023-08-28: qty 1
  Filled 2023-08-28: qty 2

## 2023-08-28 MED ORDER — ANTICOAGULANT SODIUM CITRATE 4% (200MG/5ML) IV SOLN: 5.0000 mL | Status: DC | PRN

## 2023-08-28 MED ORDER — LIDOCAINE HCL (PF) 1 % IJ SOLN: 5.0000 mL | INTRAMUSCULAR | Status: DC | PRN

## 2023-08-28 MED ORDER — LOPERAMIDE HCL 2 MG PO CAPS
2.0000 mg | ORAL_CAPSULE | ORAL | Status: AC | PRN
  Administered 2023-08-29 – 2023-08-30 (×2): 2 mg via ORAL
  Filled 2023-08-28 (×2): qty 1

## 2023-08-28 MED ORDER — NEPRO/CARBSTEADY PO LIQD: 237.0000 mL | ORAL | Status: DC | PRN

## 2023-08-28 MED ORDER — HEPARIN SODIUM (PORCINE) 1000 UNIT/ML DIALYSIS: 1000.0000 [IU] | INTRAMUSCULAR | Status: DC | PRN

## 2023-08-28 MED ORDER — LOSARTAN POTASSIUM 50 MG PO TABS
50.0000 mg | ORAL_TABLET | Freq: Every day | ORAL | Status: DC
  Administered 2023-08-28 – 2023-08-30 (×3): 50 mg via ORAL
  Filled 2023-08-28 (×3): qty 1

## 2023-08-28 MED ORDER — HEPARIN SODIUM (PORCINE) 1000 UNIT/ML IJ SOLN
INTRAMUSCULAR | Status: AC
  Filled 2023-08-28: qty 4

## 2023-08-28 MED ORDER — DULOXETINE HCL 20 MG PO CPEP
20.0000 mg | ORAL_CAPSULE | Freq: Every day | ORAL | Status: DC
  Administered 2023-08-28 – 2023-09-01 (×5): 20 mg via ORAL
  Filled 2023-08-28 (×7): qty 1

## 2023-08-28 MED ORDER — PENTAFLUOROPROP-TETRAFLUOROETH EX AERO: 1.0000 | INHALATION_SPRAY | CUTANEOUS | Status: DC | PRN

## 2023-08-28 MED ORDER — ALTEPLASE 2 MG IJ SOLR: 2.0000 mg | Freq: Once | INTRAMUSCULAR | Status: DC | PRN

## 2023-08-28 MED ORDER — CHLORHEXIDINE GLUCONATE CLOTH 2 % EX PADS
6.0000 | MEDICATED_PAD | Freq: Every day | CUTANEOUS | Status: DC
  Administered 2023-08-28 – 2023-08-29 (×2): 6 via TOPICAL

## 2023-08-28 MED ORDER — DEXTROSE 50 % IV SOLN
INTRAVENOUS | Status: AC
  Filled 2023-08-28: qty 50

## 2023-08-28 MED ORDER — LIDOCAINE HCL 1 % IJ SOLN
20.0000 mL | Freq: Once | INTRAMUSCULAR | Status: AC
  Administered 2023-08-28: 10 mL

## 2023-08-28 MED ORDER — DEXTROSE IN LACTATED RINGERS 5 % IV SOLN: INTRAVENOUS | Status: DC

## 2023-08-28 MED ORDER — INSULIN GLARGINE-YFGN 100 UNIT/ML ~~LOC~~ SOLN
6.0000 [IU] | Freq: Every day | SUBCUTANEOUS | Status: DC
Start: 2023-08-28 — End: 2023-08-28
  Filled 2023-08-28: qty 0.06

## 2023-08-28 MED ORDER — LIDOCAINE HCL 1 % IJ SOLN
INTRAMUSCULAR | Status: AC
  Filled 2023-08-28: qty 20

## 2023-08-28 MED ORDER — INSULIN ASPART 100 UNIT/ML IJ SOLN
0.0000 [IU] | Freq: Three times a day (TID) | INTRAMUSCULAR | Status: DC
  Administered 2023-08-29: 4 [IU] via SUBCUTANEOUS
  Administered 2023-08-29 – 2023-08-30 (×2): 2 [IU] via SUBCUTANEOUS
  Administered 2023-08-30: 4 [IU] via SUBCUTANEOUS
  Administered 2023-08-31: 1 [IU] via SUBCUTANEOUS

## 2023-08-28 MED ORDER — CARVEDILOL 25 MG PO TABS
25.0000 mg | ORAL_TABLET | Freq: Two times a day (BID) | ORAL | Status: DC
  Administered 2023-08-28 – 2023-09-02 (×7): 25 mg via ORAL
  Filled 2023-08-28 (×8): qty 1

## 2023-08-28 MED ORDER — HEPARIN SODIUM (PORCINE) 5000 UNIT/ML IJ SOLN
5000.0000 [IU] | Freq: Three times a day (TID) | INTRAMUSCULAR | Status: DC
  Administered 2023-08-29 – 2023-09-02 (×10): 5000 [IU] via SUBCUTANEOUS
  Filled 2023-08-28 (×12): qty 1

## 2023-08-28 NOTE — Plan of Care (Signed)

## 2023-08-28 NOTE — Progress Notes (Signed)
 Tx resumed

## 2023-08-28 NOTE — Procedures (Signed)
 PROCEDURE SUMMARY:  Patient presented to IR today for paracentesis.  Upon assessment, patient was alert and oriented, but appeared uncomfortable, slightly lethargic.  Patient offered "I think my blood sugar is low."  CBG showed hypoglycemia at 49.  Given 1 amp D50 in IR as it appeared to be her 2nd hypoglycemic event after ending insulin  drip yesterday.  Successful US  guided paracentesis from left lateral abdomen.  Yielded 2.5 liters of amber fluid.  No immediate complications.  Pt tolerated well.   Specimen was sent for labs.  EBL < 5mL  Repeat CBG 162 prior to leaving IR.  Marian Meneely Sue-Ellen Joslynn Jamroz PA-C 08/28/2023 12:20 PM

## 2023-08-28 NOTE — Progress Notes (Addendum)
 PROGRESS NOTE        PATIENT DETAILS Name: Ashley Freeman Age: 33 y.o. Sex: female Date of Birth: 06-14-1990 Admit Date: 08/26/2023 Admitting Physician Juliette Oh, MD WUJ:[id number], [name], MD  Brief Summary: Patient is a 33 y.o.  female with history of ESRD on HD TTS, chronic HFrEF, DM-1, s/p recent cholecystectomy on 3/4-who presented with abdominal pain/distention-found to have DKA and subsequently admitted to the hospitalist service.  Significant events: 4/28>> admit to [facility]  Significant studies: 3/03>> echo: EF 25-30%, grade 2 diastolic dysfunction 4/29>> CT abdomen/pelvis: Large volume ascites, small bilateral pleural effusions, diffuse body wall edema.  Significant microbiology data: None  Procedures: None  Consults: Nephrology  Subjective: Appears comfortable-no complaints-but when abdomen gently palpated-she has pain.  No nausea or vomiting overnight.  Claims she missed HD yesterday as she was in the ED-but otherwise has not missed any hemodialysis.  Objective: Vitals: Blood pressure (!) 145/108, pulse 80, temperature 98.1 F (36.7 C), temperature source Axillary, resp. rate 17, height 5\' 3"  (1.6 m), weight 67.9 kg, SpO2 93%, unknown if currently breastfeeding.   Exam: Gen Exam:Alert awake-not in any distress HEENT:atraumatic, normocephalic Chest: B/L clear to auscultation anteriorly CVS:S1S2 regular Abdomen: Soft-distended abdomen with dullness in the flanks.  Mildly but diffusely tender when palpated Extremities:+ edema Neurology: Non focal Skin: no rash  Pertinent Labs/Radiology:    Latest Ref Rng & Units 08/27/2023    5:26 AM 08/26/2023   10:28 PM 08/14/2023   12:30 PM  CBC  WBC 4.0 - 10.5 K/uL 9.1  6.8  6.7   Hemoglobin 12.0 - 15.0 g/dL 78.2  95.6  21.3   Hematocrit 36.0 - 46.0 % 41.5  41.7  41.7   Platelets 150 - 400 K/uL 189  159  196     Lab Results  Component Value Date   NA 136 08/27/2023   K  4.6 08/27/2023   CL 102 08/27/2023   CO2 20 (L) 08/27/2023    Assessment/Plan: DKA Resolved with IV insulin  Has been transition to SQ insulin -see below  Abdominal pain-worsening ascites Continues to have abdominal pain-is s/p laparoscopic cholecystectomy on 3/4.  CT done on 4/29 with worsening ascites-confirmed on physical exam.  Mildly tender Unclear whether ascites is reflective of CHF/ESRD state or another etiology.  No known history of liver disease. Looks like-she was supposed to get outpatient paracentesis-we will ask IR to perform a diagnostic paracentesis.  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  Recent Labs    08/27/23 1650 08/27/23 2128 08/28/23 0751  GLUCAP 103* 302* 38*    ESRD on HD TTS Nephrology following  Chronic HFrEF (EF 25-30% by echo on 3/3) Appears volume overloaded-has leg edema-evidence of ascites/pleural effusion on imaging studies Claims she has been compliant with HD Volume removal with HD Resume Coreg /losartan -no longer taking Entresto due to rash.  Prolonged QTc Recheck twelve-lead EKG Monitor in telemetry.  Mood disorder Discussed with patient-she is no longer on Prozac  Apparently is taking duloxetine/Lamictal  and methylphenidate-which is being resumed.  Code status:   Code Status: Full Code  DVT Prophylaxis: heparin  injection 5,000 Units Start: 08/28/23 1400 SCDs Start: 08/27/23 0507   Family Communication: None at bedside   Disposition Plan: Status is: Observation The patient will require care spanning > 2 midnights and should be moved to inpatient because: Severity of illness   Planned Discharge  Destination:Home   Diet: Diet Order             Diet Carb Modified Fluid consistency: Thin; Room service appropriate? Yes  Diet effective now                     Antimicrobial agents: Anti-infectives (From admission, onward)    None        MEDICATIONS: Scheduled Meds:  carvedilol   25 mg Oral BID WC   Chlorhexidine  Gluconate Cloth  6 each Topical Q0600   DULoxetine  20 mg Oral Daily   heparin  injection (subcutaneous)  5,000 Units Subcutaneous Q8H   insulin  aspart  0-6 Units Subcutaneous TID WC   insulin  glargine-yfgn  6 Units Subcutaneous QHS   losartan   50 mg Oral Daily   rosuvastatin   40 mg Oral Daily   Continuous Infusions: PRN Meds:.acetaminophen  **OR** acetaminophen , dextrose , fentaNYL  (SUBLIMAZE ) injection, hydrOXYzine , naLOXone  (NARCAN )  injection   I have personally reviewed following labs and imaging studies  LABORATORY DATA: CBC: Recent Labs  Lab 08/26/23 2228 08/27/23 0526  WBC 6.8 9.1  HGB 12.1 12.2  HCT 41.7 41.5  MCV 94.6 92.4  PLT 159 189    Basic Metabolic Panel: Recent Labs  Lab 08/26/23 2228 08/27/23 0218 08/27/23 0503 08/27/23 0526 08/27/23 1205  NA 129* 134* 139  --  136  K 5.4* 4.3 4.3  --  4.6  CL 96* 100 103  --  102  CO2 15* 20* 24  --  20*  GLUCOSE 1,022* 530* 149*  --  158*  BUN 46* 49* 46*  --  48*  CREATININE 6.00* 6.35* 6.10*  --  6.32*  CALCIUM  7.5* 7.8* 7.7*  --  7.6*  MG  --   --   --  2.4  --     GFR: Estimated Creatinine Clearance: 11.9 mL/min (A) (by C-G formula based on SCr of 6.32 mg/dL (H)).  Liver Function Tests: Recent Labs  Lab 08/26/23 2228  AST 46*  ALT 49*  ALKPHOS 362*  BILITOT 1.2  PROT 6.2*  ALBUMIN 3.2*   Recent Labs  Lab 08/26/23 2228  LIPASE 27   No results for input(s): "AMMONIA" in the last 168 hours.  Coagulation Profile: No results for input(s): "INR", "PROTIME" in the last 168 hours.  Cardiac Enzymes: No results for input(s): "CKTOTAL", "CKMB", "CKMBINDEX",  "TROPONINI" in the last 168 hours.  BNP (last 3 results) No results for input(s): "PROBNP" in the last 8760 hours.  Lipid Profile: No results for input(s): "CHOL", "HDL", "LDLCALC", "TRIG", "CHOLHDL", "LDLDIRECT" in the last 72 hours.  Thyroid  Function Tests: No results for input(s): "TSH", "T4TOTAL", "FREET4", "T3FREE", "THYROIDAB" in the last 72 hours.  Anemia Panel: No results for input(s): "VITAMINB12", "FOLATE", "FERRITIN", "TIBC", "IRON ", "RETICCTPCT" in the last 72 hours.  Urine analysis:    Component Value Date/Time   COLORURINE YELLOW 06/23/2023 1915   APPEARANCEUR HAZY (A) 06/23/2023 1915   APPEARANCEUR Hazy 11/10/2013 2043   LABSPEC 1.019 06/23/2023 1915   LABSPEC 1.031 11/10/2013 2043   PHURINE 8.0 06/23/2023 1915   GLUCOSEU 150 (A) 06/23/2023 1915   GLUCOSEU >=500 11/10/2013 2043   HGBUR NEGATIVE 06/23/2023 1915   BILIRUBINUR  NEGATIVE 06/23/2023 1915   BILIRUBINUR negative 06/04/2018 1035   BILIRUBINUR Negative 11/24/2015 1443   BILIRUBINUR Negative 11/10/2013 2043   KETONESUR 5 (A) 06/23/2023 1915   PROTEINUR >=300 (A) 06/23/2023 1915   UROBILINOGEN 0.2 09/14/2019 1732   NITRITE NEGATIVE 06/23/2023 1915   LEUKOCYTESUR NEGATIVE 06/23/2023 1915   LEUKOCYTESUR 1+ 11/10/2013 2043    Sepsis Labs: Lactic Acid, Venous    Component Value Date/Time   LATICACIDVEN 3.2 (HH) 06/30/2023 1102    MICROBIOLOGY: Recent Results (from the past 240 hours)  MRSA Next Gen by PCR, Nasal     Status: None   Collection Time: 08/28/23  4:08 AM   Specimen: Nasal Mucosa; Nasal Swab  Result Value Ref Range Status   MRSA by PCR Next Gen NOT DETECTED NOT DETECTED Final    Comment: (NOTE) The GeneXpert MRSA Assay (FDA approved for NASAL specimens only), is one component of a comprehensive MRSA colonization surveillance program. It is not intended to diagnose MRSA infection nor to guide or monitor treatment for MRSA infections. Test performance is not FDA approved in patients  less than 49 years old. Performed at [facility], [address], [place], [place] [address]     RADIOLOGY STUDIES/RESULTS: CT ABDOMEN PELVIS WO CONTRAST Result Date: 08/27/2023 CLINICAL DATA:  Abdominal pain with nausea and vomiting. Recent cholecystectomy on 07/02/2023. EXAM: CT ABDOMEN AND PELVIS WITHOUT CONTRAST TECHNIQUE: Multidetector CT imaging of the abdomen and pelvis was performed following the standard protocol without IV contrast. RADIATION DOSE REDUCTION: This exam was performed according to the departmental dose-optimization program which includes automated exposure control, adjustment of the mA and/or kV according to patient size and/or use of iterative reconstruction technique. COMPARISON:  07/19/2023 FINDINGS: Lower chest: Interlobular septal thickening is associated with bilateral small pleural effusions, right greater than left. Hepatobiliary: No suspicious focal abnormality in the liver on this study without intravenous contrast. Hepatomegaly again noted. Cholecystectomy. Stable crescent of material in the gallbladder fossa compared to 07/19/2023, likely postsurgical. No intrahepatic or extrahepatic biliary dilation. Pancreas: No focal mass lesion. No dilatation of the main duct. No intraparenchymal cyst. No peripancreatic edema. Spleen: No splenomegaly. No suspicious focal mass lesion. Adrenals/Urinary Tract: No adrenal nodule or mass. Kidneys unremarkable. No evidence for hydroureter. The urinary bladder appears normal for the degree of distention. Stomach/Bowel: Stomach is unremarkable. No gastric wall thickening. No evidence of outlet obstruction. Duodenum is normally positioned as is the ligament of Treitz. No small bowel wall thickening. No small bowel dilatation. The terminal ileum is normal. The appendix is normal. No gross colonic mass. No colonic wall thickening. Vascular/Lymphatic: No abdominal aortic aneurysm. Upper normal retroperitoneal lymph nodes evident although no  abdominal lymphadenopathy on this noncontrast study.No pelvic sidewall lymphadenopathy. Upper normal lymph nodes in the groin regions bilaterally are similar to prior and likely reactive. Reproductive: Unremarkable. Other: Moderate to large volume ascites is substantially progressive since 07/19/2023. Musculoskeletal: Diffuse body wall edema. No worrisome lytic or sclerotic osseous abnormality. IMPRESSION: 1. Moderate to large volume ascites is substantially progressive since 07/19/2023. 2. Interlobular septal thickening is associated with bilateral small pleural effusions, right greater than left. Imaging features compatible with pulmonary edema. 3. Diffuse body wall edema. 4. Stable crescent of material in the gallbladder fossa compared to 07/19/2023, likely postsurgical. Electronically Signed   By: Donnal Fusi M.D.   On: 08/27/2023 06:20   DG Chest Portable 1 View Result Date: 08/26/2023 CLINICAL DATA:  Coughing, dialysis patient, hyperglycemia EXAM: PORTABLE CHEST 1 VIEW COMPARISON:  08/14/2023 FINDINGS: Stable cardiomegaly. Interstitial coarsening greatest in the lower lungs. Hazy ground-glass opacities throughout the right lung greatest in the right lower lung. No pleural effusion or pneumothorax. IMPRESSION: Hazy ground-glass opacities throughout the right lung greatest in the right lower lung. Lower lung interstitial thickening. Findings may be due to edema or infection. Electronically Signed   By: Rozell Cornet M.D.   On: 08/26/2023 22:13     LOS: 0 days   Kimberly Penna, MD  [facility]    To contact the attending provider between 7A-7P or the covering provider during after hours 7P-7A, please log into the web site [email or web address] and access using universal [place] password for that web site. If you do not have the password, please call the hospital operator.  08/28/2023, 9:41 AM

## 2023-08-28 NOTE — Plan of Care (Signed)
  Problem: Fluid Volume: Goal: Ability to maintain a balanced intake and output will improve Outcome: Progressing   Problem: Health Behavior/Discharge Planning: Goal: Ability to manage health-related needs will improve Outcome: Progressing   Problem: Metabolic: Goal: Ability to maintain appropriate glucose levels will improve Outcome: Progressing   Problem: Tissue Perfusion: Goal: Adequacy of tissue perfusion will improve Outcome: Progressing   Problem: Education: Goal: Knowledge of General Education information will improve Description: Including pain rating scale, medication(s)/side effects and non-pharmacologic comfort measures Outcome: Progressing   Problem: Activity: Goal: Risk for activity intolerance will decrease Outcome: Progressing   Problem: Clinical Measurements: Goal: Diagnostic test results will improve Outcome: Progressing

## 2023-08-28 NOTE — Progress Notes (Signed)
   08/28/23 1246  TOC Brief Assessment  Insurance and Status Reviewed [facility])  Patient has primary care physician Yes ([name], [name], MD)  Home environment has been reviewed From Home  Prior level of function: Independent  Prior/Current Home Services No current home services  Social Drivers of Health Review SDOH reviewed no interventions necessary  Readmission risk has been reviewed Yes (40%  (28% due to 10 ED visits on last 6 months ))  Transition of care needs transition of care needs identified, TOC will continue to follow (Following for recommendations)   Patient from home. Following for [facility] recommendations

## 2023-08-28 NOTE — Progress Notes (Signed)
Pt off of unit for HD.

## 2023-08-28 NOTE — Consult Note (Addendum)
 Renal Service Consult Note Ashley Freeman [facility]  Ashley Freeman 08/28/2023 [name], MD Requesting Physician: Dr. Veto Gowda  Reason for Consult: ESRD patient with HHS and volume overload HPI: The patient is a 33 y.o. year-old w/ PMH as below who presented to ED the evening of 4/28 with complaint of generalized abdominal pain and uncontrolled blood sugars.  Patient is on dialysis TTS.  In ED blood pressure was high 158/116, 96% on room air, high respiratory rate 35 significant ascites, WBC 6K, Hgb 12, albumin 3.2.  Chest x-ray showed perihilar interstitial changes consistent with pulmonary edema.  No fevers or chills.  Patient with history of low EF 25-30%.  Patient was admitted for HHS versus DKA.  We are asked to see for dialysis.   Pt seen in hospital room.  Patient is not in distress.  She is scheduled for a paracentesis today. Pt states she has had fluid drawn off her chest before, but not from her abdomen.    ROS - denies CP, no joint pain, no HA, no blurry vision, no rash, no diarrhea, no nausea/ vomiting  PMH: Anemia Gluteal abscess Bipolar disorder Depression Diabetes type 1 ESRD on HD HFrEF / EF 25 to 30% HL HTN NICM  Past Surgical History  Past Surgical History:  Procedure Laterality Date   A/V FISTULAGRAM Right 06/17/2023   Procedure: A/V Fistulagram;  Surgeon: Celso College, MD;  Location: [facility] INVASIVE CV LAB;  Service: Cardiovascular;  Laterality: Right;   AV FISTULA PLACEMENT Right 07/06/2022   Procedure: ARTERIOVENOUS GRAFT CREATION;  Surgeon: Young Hensen, MD;  Location: [facility] OR;  Service: Vascular;  Laterality: Right;   CESAREAN SECTION N/A 10/05/2019   Procedure: CESAREAN SECTION;  Surgeon: Raynell Caller, MD;  Location: [facility] LD ORS;  Service: Obstetrics;  Laterality: N/A;   CHOLECYSTECTOMY N/A 07/02/2023   Procedure: LAPAROSCOPIC CHOLECYSTECTOMY;  Surgeon: Enid Harry, MD;  Location: [facility] OR;  Service: General;   Laterality: N/A;   INCISION AND DRAINAGE ABSCESS Left 09/28/2019   Procedure: INCISION AND DRAINAGE VULVAR ABCESS;  Surgeon: Albino Hum, MD;  Location: [facility] OR;  Service: Gynecology;  Laterality: Left;   INCISION AND DRAINAGE PERIRECTAL ABSCESS Right 08/18/2013   Procedure: IRRIGATION AND DEBRIDEMENT GLUTEAL ABSCESS;  Surgeon: Shela Derby, MD;  Location: [facility] OR;  Service: General;  Laterality: Right;   INCISION AND DRAINAGE PERIRECTAL ABSCESS Right 09/19/2013   Procedure: IRRIGATION AND DEBRIDEMENT RIGHT GLUTEAL AND LABIAL ABSCESSES;  Surgeon: Shela Derby, MD;  Location: [facility] OR;  Service: General;  Laterality: Right;   INCISION AND DRAINAGE PERIRECTAL ABSCESS Right 09/24/2013   Procedure: IRRIGATION AND DEBRIDEMENT PERIRECTAL ABSCESS;  Surgeon: Diantha Fossa, MD;  Location: [facility] OR;  Service: General;  Laterality: Right;   IR PARACENTESIS  08/28/2023   Family History  Family History  Problem Relation Age of Onset   Asthma Mother    Carpal tunnel syndrome Mother    Gout Father    Diabetes Paternal Grandmother    Anesthesia problems Neg Hx    Social History  reports that she has never smoked. She has never been exposed to tobacco smoke. She has never used smokeless tobacco. She reports that she does not currently use alcohol. She reports that she does not use drugs. Allergies  Allergies  Allergen Reactions   Cephalexin  Anaphylaxis    Has gotten ceftriaxone  in the past    Morphine  Itching   Penicillins Hives and Rash   Fish Allergy    Benadryl  [Diphenhydramine ] Itching  Dilaudid  [Hydromorphone ] Itching   Doxycycline  Itching   Methotrexate Derivatives Rash   Oxycodone  Itching   Home medications Prior to Admission medications   Medication Sig Start Date End Date Taking? Authorizing Provider  acetaminophen  (TYLENOL ) 500 MG tablet Take 1,000 mg by mouth as needed for mild pain (pain score 1-3) or moderate pain (pain score 4-6).   Yes [provider]  albuterol   (VENTOLIN  HFA) 108 (90 Base) MCG/ACT inhaler Inhale 2 puffs into the lungs every 4 (four) hours as needed for wheezing or shortness of breath. 11/24/22  Yes [provider]  bumetanide (BUMEX) 2 MG tablet Take 2 mg by mouth daily.   Yes [provider]  calcitRIOL  (ROCALTROL ) 0.25 MCG capsule Take 5 capsules (1.25 mcg total) by mouth every Tuesday, Thursday, and Saturday at 6 PM. 07/09/23  Yes Oral Billings, MD  carvedilol  (COREG ) 25 MG tablet Take 25 mg by mouth 2 (two) times daily with a meal. 05/15/23  Yes [provider]  DULoxetine (CYMBALTA) 20 MG capsule Take 20 mg by mouth daily. 07/29/23  Yes [provider]  ENTRESTO 24-26 MG Take 0.5 tablets by mouth 2 (two) times daily. 07/29/23  Yes [provider]  famotidine  (PEPCID ) 20 MG tablet Take 20 mg by mouth 2 (two) times daily. 07/29/23  Yes [provider]  hydrocortisone  2.5 % cream Apply 1 Application topically daily as needed. 12/28/22 12/28/23 Yes [provider]  hydrOXYzine  (ATARAX ) 25 MG tablet Take 25 mg by mouth every 6 (six) hours as needed for anxiety or itching. 05/14/23  Yes [provider]  insulin  aspart (NOVOLOG ) 100 UNIT/ML injection Inject 5 Units into the skin 3 (three) times daily as needed for high blood sugar.   Yes [provider]  LANTUS  SOLOSTAR 100 UNIT/ML Solostar Pen Inject 10 Units into the skin at bedtime.   Yes [provider]  LORazepam  (ATIVAN ) 1 MG tablet Take 1 mg by mouth daily as needed. 07/11/23  Yes [provider]  losartan  (COZAAR ) 100 MG tablet Take 150 mg by mouth daily. 03/25/23  Yes [provider]  methylphenidate 18 MG PO CR tablet Take 18 mg by mouth every morning. 08/21/23  Yes [provider]  ondansetron  (ZOFRAN -ODT) 4 MG disintegrating tablet Take 1 tablet (4 mg total) by mouth every 8 (eight) hours as needed for nausea or vomiting. 06/23/23  Yes Davis, Jonathon H, MD  polyethylene  glycol (MIRALAX  / GLYCOLAX ) 17 g packet Take 17 g by mouth daily as needed for mild constipation. 12/07/22  Yes [provider]  rosuvastatin  (CRESTOR ) 40 MG tablet Take 40 mg by mouth daily. 05/22/22  Yes [provider]  sevelamer  carbonate (RENVELA ) 800 MG tablet Take 2,400 mg by mouth 3 (three) times daily with meals.   Yes [provider]  sodium bicarbonate  650 MG tablet Take 1,300 mg by mouth 2 (two) times daily. 07/29/23  Yes [provider]  SUMAtriptan  (IMITREX ) 50 MG tablet Take 50 mg by mouth every 2 (two) hours as needed for migraine or headache. 06/20/22  Yes [provider]  Continuous Glucose Sensor (DEXCOM G7 SENSOR) MISC Change sensors every 10 days 09/07/22   Shamleffer, Ibtehal Jaralla, MD  dicyclomine  (BENTYL ) 20 MG tablet Take 1 tablet (20 mg total) by mouth 2 (two) times daily for 14 days. 07/25/23 08/08/23  Sonnie Dusky, PA-C  Insulin  Aspart FlexPen (NOVOLOG ) 100 UNIT/ML Inject 4 Units into the skin 3 (three) times daily. 05/10/23 07/20/23  [provider]  lamoTRIgine  (LAMICTAL ) 200 MG tablet Take 200 mg by mouth daily. 07/11/23   [provider]     Vitals:   08/28/23 0439 08/28/23 0800 08/28/23 1100 08/28/23 1158  BP: 131/89 (!) 145/108 (!) 151/111 (!) 144/98  Pulse: 80 80  76  Resp: (!) 23 17  17   Temp: 97.6 F (36.4 C) 98.1 F (36.7 C)  97.6 F (36.4 C)  TempSrc: Oral Axillary  Oral  SpO2: 90% 93%  92%  Weight:      Height:       Exam Gen alert, no distress, on RA No rash, cyanosis or gangrene Sclera anicteric, throat clear  No jvd or bruits Chest clear bilat to bases, no rales/ wheezing RRR no MRG Abd soft ntnd, +ascites 2+ GU deferred MS no joint effusions or deformity Ext 1+ LE edema, no other edema Neuro is alert, Ox 3 , nf    RUE AVG+ bruit     Renal-related home meds: Bumex 2 mg daily Calcitriol  1.25 mcg TTS Renvela  3 AC 3 times daily Sodium bicarbonate  2 twice daily Others:  Methylphenidate, Ativan , Lamictal , insulin  aspart, Pepcid , albuterol     OP HD: TTS [facility] [address] [facility]  From 08/14/23 --> 3h   B350  57.5kg  R AVG  Heparin  1600 + 600u/hr  Assessment/ Plan: HHS/ DKA: per primary team Volume overload: w/ pulm edema by CXR, ascites, and some LE edema. Not in distress. Plan HD later today w/ max UF.   ESRD: on HD TTS. Plan is for HD today/ tonight.  HTN: bp uncontrolled, get vol down w/ HD Volume: as above, volume overload Anemia of esrd: Hb 12, follow Secondary hyperparathyroidism: CCa is low, get records from [facility] DM type 1: per pmd      Larry Poag  MD [facility] 08/28/2023, 3:46 PM  Recent Labs  Lab 08/26/23 2228 08/27/23 0218 08/27/23 0503 08/27/23 0526 08/27/23 1205 08/28/23 1035  HGB 12.1  --   --  12.2  --   --   ALBUMIN 3.2*  --   --   --   --  3.3*  CALCIUM  7.5*   < > 7.7*  --  7.6*  --   CREATININE 6.00*   < > 6.10*  --  6.32*  --   K 5.4*   < > 4.3  --  4.6  --    < > = values in this interval not displayed.   Inpatient medications:  carvedilol   25 mg Oral BID WC   Chlorhexidine  Gluconate Cloth  6 each Topical Q0600   DULoxetine  20 mg Oral Daily   heparin   2,500 Units Dialysis Once in dialysis   heparin  injection (subcutaneous)  5,000 Units Subcutaneous Q8H   insulin  aspart  0-6 Units Subcutaneous TID WC   insulin  glargine-yfgn  4 Units Subcutaneous QHS   losartan   50 mg Oral Daily   rosuvastatin   10 mg Oral Daily    anticoagulant sodium citrate      dextrose  5% lactated ringers      acetaminophen  **OR** acetaminophen , alteplase , anticoagulant sodium citrate , dextrose , feeding supplement (NEPRO CARB STEADY), fentaNYL  (SUBLIMAZE ) injection, heparin , heparin , hydrOXYzine , lidocaine  (PF), lidocaine -prilocaine , naLOXone  (NARCAN )  injection, pentafluoroprop-tetrafluoroeth

## 2023-08-28 NOTE — Inpatient Diabetes Management (Addendum)
 Inpatient Diabetes Program Recommendations  AACE/ADA: New Consensus Statement on Inpatient Glycemic Control (2015)  Target Ranges:  Prepandial:   less than 140 mg/dL      Peak postprandial:   less than 180 mg/dL (1-2 hours)      Critically ill patients:  140 - 180 mg/dL   Lab Results  Component Value Date   GLUCAP 38 (LL) 08/28/2023   HGBA1C 10.3 (H) 08/27/2023    Latest Reference Range & Units 08/26/23 22:42 08/27/23 00:42 08/27/23 01:15 08/27/23 01:47 08/27/23 02:20 08/27/23 03:03 08/27/23 03:53 08/27/23 04:33 08/27/23 05:06  Glucose-Capillary 70 - 99 mg/dL >440 (HH) >102 (HH) Novolog  8 units  >600 (HH) 548 (HH) 486 (H) 369 (H) 259 (H) 187 (H) 143 (H)    Latest Reference Range & Units 08/27/23 06:18 08/27/23 07:28 08/27/23 08:35 08/27/23 09:45 08/27/23 10:55 08/27/23 10:57 08/27/23 11:58 08/27/23 13:01 08/27/23 13:59  Glucose-Capillary 70 - 99 mg/dL 725 (H) 90 80 82 13 (LL) 104 (H) 144 (H) 153 (H) 138 (H)    Latest Reference Range & Units 08/27/23 16:50 08/27/23 21:28 08/28/23 07:51  Glucose-Capillary 70 - 99 mg/dL 366 (H) 440 (H) Novolog  4 units 38 (LL)  D50  (LL): Data is critically low (H): Data is abnormally high  Diabetes history: DM1 Outpatient Diabetes medications: Lantus  10 at bedtime, Novolog  4 units TID. Has Dexcom Current orders for Inpatient glycemic control: Semglee  10 units at bedtime, Novolog  0-9 units TID with meals and 0-5 HS  Inpatient Diabetes Program Recommendations:   Fasting CBG 38. Please consider: -Decrease Semglee  to 8 units -Add Novolog  2 units tid meal coverage if eats 50% meal -Decrease Novolog  correction to 0-6 units tid, 0-5 units hs  1:30 Met with patient and her daughter @ bedside. Patient states when she feels bad, she doesn't take her insulin . Reviewed with patient sick day rules and she needs basal insulin  even when she is sick and not eating. Patient wears a sensor but the daughter states the sensor stopped sending readings to her phone.  Requested patient to take her phone and review trends of blood glucose with MD on appts.   Thank you, Haylo Fake E. Kerianne Gurr, RN, MSN, CDCES  Diabetes Coordinator Inpatient Glycemic Control Team Team Pager [phone] (8am-5pm) 08/28/2023 8:55 AM

## 2023-08-28 NOTE — Progress Notes (Signed)
Hd start 

## 2023-08-28 NOTE — Progress Notes (Signed)
 Tx paused. Pt needs to use restroom.

## 2023-08-29 ENCOUNTER — Encounter (HOSPITAL_COMMUNITY): Payer: Self-pay | Admitting: Internal Medicine

## 2023-08-29 ENCOUNTER — Inpatient Hospital Stay (HOSPITAL_COMMUNITY)

## 2023-08-29 DIAGNOSIS — E875 Hyperkalemia: Secondary | ICD-10-CM | POA: Diagnosis not present

## 2023-08-29 DIAGNOSIS — E1165 Type 2 diabetes mellitus with hyperglycemia: Secondary | ICD-10-CM | POA: Diagnosis not present

## 2023-08-29 DIAGNOSIS — I428 Other cardiomyopathies: Secondary | ICD-10-CM

## 2023-08-29 DIAGNOSIS — I5042 Chronic combined systolic (congestive) and diastolic (congestive) heart failure: Secondary | ICD-10-CM | POA: Diagnosis not present

## 2023-08-29 DIAGNOSIS — N186 End stage renal disease: Secondary | ICD-10-CM | POA: Diagnosis not present

## 2023-08-29 LAB — GLUCOSE, CAPILLARY
Glucose-Capillary: 143 mg/dL — ABNORMAL HIGH (ref 70–99)
Glucose-Capillary: 168 mg/dL — ABNORMAL HIGH (ref 70–99)
Glucose-Capillary: 201 mg/dL — ABNORMAL HIGH (ref 70–99)
Glucose-Capillary: 221 mg/dL — ABNORMAL HIGH (ref 70–99)
Glucose-Capillary: 223 mg/dL — ABNORMAL HIGH (ref 70–99)
Glucose-Capillary: 226 mg/dL — ABNORMAL HIGH (ref 70–99)
Glucose-Capillary: 228 mg/dL — ABNORMAL HIGH (ref 70–99)
Glucose-Capillary: 244 mg/dL — ABNORMAL HIGH (ref 70–99)
Glucose-Capillary: 26 mg/dL — CL (ref 70–99)
Glucose-Capillary: 319 mg/dL — ABNORMAL HIGH (ref 70–99)
Glucose-Capillary: 361 mg/dL — ABNORMAL HIGH (ref 70–99)
Glucose-Capillary: 415 mg/dL — ABNORMAL HIGH (ref 70–99)
Glucose-Capillary: 51 mg/dL — ABNORMAL LOW (ref 70–99)
Glucose-Capillary: 65 mg/dL — ABNORMAL LOW (ref 70–99)

## 2023-08-29 LAB — RENAL FUNCTION PANEL
Albumin: 3.4 g/dL — ABNORMAL LOW (ref 3.5–5.0)
Anion gap: 18 — ABNORMAL HIGH (ref 5–15)
BUN: 51 mg/dL — ABNORMAL HIGH (ref 6–20)
CO2: 21 mmol/L — ABNORMAL LOW (ref 22–32)
Calcium: 8.1 mg/dL — ABNORMAL LOW (ref 8.9–10.3)
Chloride: 101 mmol/L (ref 98–111)
Creatinine, Ser: 6.59 mg/dL — ABNORMAL HIGH (ref 0.44–1.00)
GFR, Estimated: 8 mL/min — ABNORMAL LOW (ref >60)
Glucose, Bld: 67 mg/dL — ABNORMAL LOW (ref 70–99)
Phosphorus: 4.4 mg/dL (ref 2.5–4.6)
Potassium: 4.3 mmol/L (ref 3.5–5.1)
Sodium: 140 mmol/L (ref 135–145)

## 2023-08-29 LAB — ECHOCARDIOGRAM COMPLETE
AR max vel: 1.31 cm2
AV Area VTI: 1.38 cm2
AV Area mean vel: 1.19 cm2
AV Mean grad: 6 mmHg
AV Peak grad: 9 mmHg
Ao pk vel: 1.5 m/s
Area-P 1/2: 4.01 cm2
Calc EF: 29.6 %
Height: 63 in
MV VTI: 2.22 cm2
S' Lateral: 3.9 cm
Single Plane A2C EF: 37.6 %
Single Plane A4C EF: 20.2 %
Weight: 2395.08 [oz_av]

## 2023-08-29 LAB — CYTOLOGY - NON PAP

## 2023-08-29 MED ORDER — INSULIN GLARGINE-YFGN 100 UNIT/ML ~~LOC~~ SOLN
8.0000 [IU] | Freq: Every day | SUBCUTANEOUS | Status: DC
  Filled 2023-08-29: qty 0.08

## 2023-08-29 MED ORDER — INSULIN ASPART 100 UNIT/ML IJ SOLN
15.0000 [IU] | Freq: Once | INTRAMUSCULAR | Status: AC
  Administered 2023-08-29: 15 [IU] via SUBCUTANEOUS

## 2023-08-29 MED ORDER — GLUCAGON HCL RDNA (DIAGNOSTIC) 1 MG IJ SOLR: 1.0000 mg | INTRAMUSCULAR | Status: AC

## 2023-08-29 MED ORDER — INSULIN GLARGINE-YFGN 100 UNIT/ML ~~LOC~~ SOLN
6.0000 [IU] | Freq: Every day | SUBCUTANEOUS | Status: DC
  Administered 2023-08-29: 6 [IU] via SUBCUTANEOUS
  Filled 2023-08-29 (×2): qty 0.06

## 2023-08-29 MED ORDER — GLUCAGON HCL RDNA (DIAGNOSTIC) 1 MG IJ SOLR: 1.0000 mg | INTRAMUSCULAR | Status: AC | PRN

## 2023-08-29 MED ORDER — POLYETHYLENE GLYCOL 3350 17 G PO PACK
17.0000 g | PACK | Freq: Every day | ORAL | Status: DC
  Administered 2023-08-29: 17 g via ORAL
  Filled 2023-08-29 (×3): qty 1

## 2023-08-29 MED ORDER — HYDROMORPHONE HCL 1 MG/ML IJ SOLN
0.5000 mg | INTRAMUSCULAR | Status: DC | PRN
  Administered 2023-08-29 – 2023-08-31 (×7): 0.5 mg via INTRAVENOUS
  Filled 2023-08-29 (×6): qty 0.5

## 2023-08-29 MED ORDER — GLUCAGON HCL RDNA (DIAGNOSTIC) 1 MG IJ SOLR
INTRAMUSCULAR | Status: AC
  Administered 2023-08-29: 1 mg via SUBCUTANEOUS
  Filled 2023-08-29: qty 1

## 2023-08-29 MED ORDER — INSULIN GLARGINE-YFGN 100 UNIT/ML ~~LOC~~ SOLN
4.0000 [IU] | Freq: Every day | SUBCUTANEOUS | Status: AC
  Administered 2023-08-29: 4 [IU] via SUBCUTANEOUS
  Filled 2023-08-29: qty 0.04

## 2023-08-29 NOTE — Inpatient Diabetes Management (Signed)
 Inpatient Diabetes Program Recommendations  AACE/ADA: New Consensus Statement on Inpatient Glycemic Control (2015)  Target Ranges:  Prepandial:   less than 140 mg/dL      Peak postprandial:   less than 180 mg/dL (1-2 hours)      Critically ill patients:  140 - 180 mg/dL    Latest Reference Range & Units 08/28/23 07:51 08/28/23 11:10 08/28/23 11:41 08/28/23 11:57 08/28/23 15:21 08/28/23 16:47 08/28/23 19:39 08/28/23 21:27  Glucose-Capillary 70 - 99 mg/dL 38 (LL) 49 (L) 829 (H) 129 (H) 48 (L) 133 (H) 159 (H) 175 (H)    Latest Reference Range & Units 08/29/23 01:35 08/29/23 04:42 08/29/23 06:47 08/29/23 07:43  Glucose-Capillary 70 - 99 mg/dL 562 (H)   130 (H) 865 (H)    319 (H)     (H): Data is abnormally high  Latest Reference Range & Units 08/29/23 01:35 08/29/23 04:42 08/29/23 06:47 08/29/23 07:43 08/29/23 10:25 08/29/23 11:55 08/29/23 12:31 08/29/23 12:47 08/29/23 13:06  Glucose-Capillary 70 - 99 mg/dL 784 (H)  4 units Semglee  @0148  361 (H) 415 (H)  15 units Novolog  319 (H)  4 units Novolog  168 (H)   4 units Semglee  @1053  51 (L) 26 (LL) 65 (L) 143 (H)      History: Type 1 Diabetes  Home DM Meds: Lantus  10 at bedtime     Novolog  4 units TID     Dexcom G7 CGM  Current Orders: Semglee  4 units AM/ 8 units at HS     Novolog  0-6 units TID     Pt given an extremely large dose of Novolog  this AM--Severe Hypoglycemia by 12pm  Note AM dose Semglee  discontinued--Pt will get 8 units Srmglee tonight  Pt is very sensitive to insulin  and does not tolerate large doses of Novolog --recommend using smaller amounts of Novolog  to correct extreme hyperglycemia     --Will follow patient during hospitalization--  Langston Pippins RN, MSN, CDCES Diabetes Coordinator Inpatient Glycemic Control Team Team Pager: [phone] (8a-5p)

## 2023-08-29 NOTE — Progress Notes (Signed)
  Echocardiogram 2D Echocardiogram has been performed.  Azul Coffie L Pelagia Iacobucci RDCS 08/29/2023, 10:31 AM

## 2023-08-29 NOTE — Progress Notes (Signed)
   08/29/23 0030  Vitals  Temp 97.7 F (36.5 C)  Temp Source Oral  BP (!) 149/80  MAP (mmHg) 98  BP Location Left Arm  BP Method Automatic  Patient Position (if appropriate) Lying  Pulse Rate 84  Pulse Rate Source Monitor  ECG Heart Rate 83  Resp 18  Oxygen Therapy  SpO2 99 %  O2 Device Room Air  During Treatment Monitoring  Blood Flow Rate (mL/min) 199 mL/min  Arterial Pressure (mmHg) -99.79 mmHg  Venous Pressure (mmHg) 73.93 mmHg  TMP (mmHg) 8.89 mmHg  Ultrafiltration Rate (mL/min) 1286 mL/min  Dialysate Flow Rate (mL/min) 299 ml/min  Dialysate Potassium Concentration 2  Dialysate Calcium  Concentration 2.5  Duration of HD Treatment -hour(s) 1.72 hour(s)  Cumulative Fluid Removed (mL) per Treatment  1413.08  HD Safety Checks Performed Yes  Intra-Hemodialysis Comments Tx completed (pt signed off early due to frequent bowel movementsw)  Post Treatment  Dialyzer Clearance Lightly streaked  Liters Processed 41.3  Fluid Removed (mL) 1400 mL  Tolerated HD Treatment No (Comment)  Post-Hemodialysis Comments  (Pt signed off early due to Diarrhea)  AVG/AVF Arterial Site Held (minutes) 10 minutes  AVG/AVF Venous Site Held (minutes) 8 minutes  Fistula / Graft Right Upper arm Arteriovenous vein graft  Placement Date/Time: 07/06/22 1013   Placed prior to admission: No  Orientation: Right  Access Location: Upper arm  Access Type: Arteriovenous vein graft  Site Condition No complications  Fistula / Graft Assessment Present;Thrill;Bruit  Status Deaccessed  Needle Size 15  Drainage Description None

## 2023-08-29 NOTE — Progress Notes (Signed)
 [facility] Progress Note   Subjective:   S/p HD overnight with 1.4L UF, also had 2.5L paracentesis. Reports her breathing is much better today. Denies CP, dizziness.   Objective Vitals:   08/29/23 0500 08/29/23 0700 08/29/23 0748 08/29/23 0830  BP:   (!) 176/112 (!) 147/85  Pulse: 78 83 93 87  Resp: (!) 21 (!) 21 20 (!) 22  Temp:   98.1 F (36.7 C)   TempSrc:   Oral   SpO2: 95% 96% 95% 95%  Weight:      Height:       Physical Exam General: Alert female in NAD Heart: RRR, no murmurs, rubs or gallops Lungs: CTA bilaterally, respirations unlabored  Abdomen: Soft, non-distended, +BS Extremities: No edema b/l lower extremities Dialysis Access: RUE AVG + bruit  Additional Objective Labs: Basic Metabolic Panel: Recent Labs  Lab 08/27/23 0218 08/27/23 0503 08/27/23 1205  NA 134* 139 136  K 4.3 4.3 4.6  CL 100 103 102  CO2 20* 24 20*  GLUCOSE 530* 149* 158*  BUN 49* 46* 48*  CREATININE 6.35* 6.10* 6.32*  CALCIUM  7.8* 7.7* 7.6*   Liver Function Tests: Recent Labs  Lab 08/26/23 2228 08/28/23 1035  AST 46*  --   ALT 49*  --   ALKPHOS 362*  --   BILITOT 1.2  --   PROT 6.2*  --   ALBUMIN 3.2* 3.3*   Recent Labs  Lab 08/26/23 2228  LIPASE 27   CBC: Recent Labs  Lab 08/26/23 2228 08/27/23 0526  WBC 6.8 9.1  HGB 12.1 12.2  HCT 41.7 41.5  MCV 94.6 92.4  PLT 159 189   Blood Culture    Component Value Date/Time   SDES PERITONEAL 08/28/2023 1230   SPECREQUEST NONE 08/28/2023 1230   CULT  08/28/2023 1230    NO GROWTH < 24 HOURS Performed at [facility], [address], [place], [place] [address]    REPTSTATUS PENDING 08/28/2023 1230    Cardiac Enzymes: No results for input(s): "CKTOTAL", "CKMB", "CKMBINDEX", "TROPONINI" in the last 168 hours. CBG: Recent Labs  Lab 08/29/23 0135 08/29/23 0442 08/29/23 0647 08/29/23 0743 08/29/23 1025  GLUCAP 223* 361* 415* 319* 168*   Iron  Studies: No results for input(s): "IRON ",  "TIBC", "TRANSFERRIN", "FERRITIN" in the last 72 hours. @lablastinr3 @ Studies/Results: IR Paracentesis Result Date: 08/28/2023 INDICATION: 33 year old female with type 1 diabetes, end-stage renal disease on dialysis, new ascites. Request for diagnostic and therapeutic paracentesis. EXAM: ULTRASOUND GUIDED DIAGNOSTIC AND THERAPEUTIC PARACENTESIS MEDICATIONS: 10 mL 1% lidocaine  COMPLICATIONS: None immediate. PROCEDURE: Informed written consent was obtained from the patient after a discussion of the risks, benefits and alternatives to treatment. A timeout was performed prior to the initiation of the procedure. Initial ultrasound scanning demonstrates a large amount of ascites within the left lateral abdomen. The left ladder abdomen was prepped and draped in the usual sterile fashion. 1% lidocaine  was used for local anesthesia. Following this, a 19 gauge, 7-cm, Yueh catheter was introduced. An ultrasound image was saved for documentation purposes. The paracentesis was performed. The catheter was removed and a dressing was applied. The patient tolerated the procedure well without immediate post procedural complication. FINDINGS: A total of approximately 2.5 liters of amber fluid was removed. Samples were sent to the laboratory as requested by the clinical team. IMPRESSION: Successful ultrasound-guided paracentesis yielding 2.5 liters of peritoneal fluid. Performed by: Kacie Matthews PA-C Electronically Signed   By: Creasie Doctor M.D.   On:  08/28/2023 14:33   Medications:   carvedilol   25 mg Oral BID WC   Chlorhexidine  Gluconate Cloth  6 each Topical Q0600   DULoxetine   20 mg Oral Daily   heparin  injection (subcutaneous)  5,000 Units Subcutaneous Q8H   insulin  aspart  0-6 Units Subcutaneous TID WC   insulin  glargine-yfgn  4 Units Subcutaneous Daily   insulin  glargine-yfgn  8 Units Subcutaneous QHS   losartan   50 mg Oral Daily   polyethylene glycol  17 g Oral Daily   rosuvastatin   10 mg Oral Daily     Dialysis Orders:  TTS [facility]  From 08/14/23 --> 3h   B350  57.5kg  R AVG  Heparin  1600 + 600u/hr  Assessment/Plan: HHS/ DKA: per primary team Volume overload: w/ pulm edema by CXR, improved s/p HD and paracentesis. Continue UF with HD as tolerated.  ESRD: on HD TTS, had HD overnight. 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

## 2023-08-29 NOTE — Progress Notes (Signed)
 Heart Failure Navigator Progress Note  Assessed for Heart & Vascular TOC clinic readiness.  Patient does not meet criteria due to ESRD on hemodialysis.   Navigator will sign off at this time.   Rhae Hammock, BSN, Scientist, clinical (histocompatibility and immunogenetics) Only

## 2023-08-29 NOTE — Progress Notes (Addendum)
 PROGRESS NOTE        PATIENT DETAILS Name: LOANNA GRANDISON Age: 33 y.o. Sex: female Date of Birth: December 29, 1990 Admit Date: 08/26/2023 Admitting Physician Tylene Galla Donzell Gallery, MD UEA:[id number], [name], MD  Brief Summary: Patient is a 33 y.o.  female with history of ESRD on HD TTS, chronic HFrEF, DM-1, s/p recent cholecystectomy on 3/4-who presented with abdominal pain/distention-found to have DKA and subsequently admitted to the hospitalist service.  Significant events: 4/28>> admit to [facility]  Significant studies: 3/03>> echo: EF 25-30%, grade 2 diastolic dysfunction 4/29>> CT abdomen/pelvis: Large volume ascites, small bilateral pleural effusions, diffuse body wall edema. 5/1>> echo: EF 30-35%, grade 2 diastolic dysfunction.  Significant microbiology data: 4/30>> peritoneal fluid culture: Negative  Procedures: 4/30>> paracentesis (WBC 306-with 53% lymphocytes, 46% monocytes, 1% neutrophils, albumin 2.1)  Consults: Nephrology  Subjective: Feels better-ate and drink dinner last night-CBGs now elevated-abdominal pain has improved.  Objective: Vitals: Blood pressure (!) 147/85, pulse 87, temperature 98.1 F (36.7 C), temperature source Oral, resp. rate (!) 22, height 5\' 3"  (1.6 m), weight 67.9 kg, SpO2 95%, unknown if currently breastfeeding.   Exam: Gen Exam:Alert awake-not in any distress HEENT:atraumatic, normocephalic Chest: B/L clear to auscultation anteriorly CVS:S1S2 regular Abdomen: Soft-distended abdomen with dullness in the flanks.  Mildly but diffusely tender when palpated Extremities:+ edema Neurology: Non focal Skin: no rash  Pertinent Labs/Radiology:    Latest Ref Rng & Units 08/27/2023    5:26 AM 08/26/2023   10:28 PM 08/14/2023   12:30 PM  CBC  WBC 4.0 - 10.5 K/uL 9.1  6.8  6.7   Hemoglobin 12.0 - 15.0 g/dL 19.1  47.8  29.5   Hematocrit 36.0 - 46.0 % 41.5  41.7  41.7   Platelets 150 - 400 K/uL 189  159  196     Lab  Results  Component Value Date   NA 136 08/27/2023   K 4.6 08/27/2023   CL 102 08/27/2023   CO2 20 (L) 08/27/2023    Assessment/Plan: DKA Resolved with IV insulin  Has been transition to SQ insulin -see below  Abdominal pain-worsening ascites Abdominal pain much better today S/p paracentesis on 4/30-see above-cytology/culture still pending but suspect ascites is likely reflective of portal hypertension in the setting of known liver causes-likely ESRD/CHF.   Continue to remove volume with HD.  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 Sugars in the 400s range this morning-will give 15 units of NovoLog  x 1-and give her 4 units of Semglee -she also has 8 units of Semglee  ordered for later this evening Follow CBGs closely.  Recent Labs    08/29/23 0647 08/29/23 0743 08/29/23 1025  GLUCAP 415* 319* 168*    ESRD on HD TTS Nephrology following  Acute on Chronic HFrEF (EF 25-30% by echo on 3/3) Appears volume overloaded-has leg edema-evidence of ascites/pleural effusion on imaging studies Claims she has been compliant with HD Volume removal with HD Continue Coreg /losartan .  Prolonged QTc Mildly long QTc-less than 500 ms on repeat twelve-lead EKG Monitor on telemetry.  Mood disorder Discussed with patient-she is no longer on Prozac  Cymbalta /duloxetine /methylphenidate has been resumed. .  Code status:   Code Status: Full Code   DVT Prophylaxis: heparin  injection 5,000 Units Start: 08/28/23 1400 SCDs Start: 08/27/23 0507  Family Communication: None at bedside   Disposition Plan: Status is: Observation The patient will require care spanning > 2 midnights and should be moved to inpatient because: Severity of illness   Planned Discharge Destination:Home   Diet: Diet Order             Diet Carb Modified  Fluid consistency: Thin; Room service appropriate? Yes  Diet effective now                     Antimicrobial agents: Anti-infectives (From admission, onward)    None        MEDICATIONS: Scheduled Meds:  carvedilol   25 mg Oral BID WC   Chlorhexidine  Gluconate Cloth  6 each Topical Q0600   DULoxetine   20 mg Oral Daily   heparin  injection (subcutaneous)  5,000 Units Subcutaneous Q8H   insulin  aspart  0-6 Units Subcutaneous TID WC   insulin  glargine-yfgn  8 Units Subcutaneous QHS   losartan   50 mg Oral Daily   polyethylene glycol  17 g Oral Daily   rosuvastatin   10 mg Oral Daily   Continuous Infusions: PRN Meds:.acetaminophen  **OR** acetaminophen , dextrose , HYDROmorphone  (DILAUDID ) injection, hydrOXYzine , loperamide , naLOXone  (NARCAN )  injection   I have personally reviewed following labs and imaging studies  LABORATORY DATA: CBC: Recent Labs  Lab 08/26/23 2228 08/27/23 0526  WBC 6.8 9.1  HGB 12.1 12.2  HCT 41.7 41.5  MCV 94.6 92.4  PLT 159 189    Basic Metabolic Panel: Recent Labs  Lab 08/26/23 2228 08/27/23 0218 08/27/23 0503 08/27/23 0526 08/27/23 1205  NA 129* 134* 139  --  136  K 5.4* 4.3 4.3  --  4.6  CL 96* 100 103  --  102  CO2 15* 20* 24  --  20*  GLUCOSE 1,022* 530* 149*  --  158*  BUN 46* 49* 46*  --  48*  CREATININE 6.00* 6.35* 6.10*  --  6.32*  CALCIUM  7.5* 7.8* 7.7*  --  7.6*  MG  --   --   --  2.4  --     GFR: Estimated Creatinine Clearance: 11.9 mL/min (A) (by C-G formula based on SCr of 6.32 mg/dL (H)).  Liver Function Tests: Recent Labs  Lab 08/26/23 2228 08/28/23 1035  AST 46*  --   ALT 49*  --   ALKPHOS 362*  --   BILITOT 1.2  --   PROT 6.2*  --   ALBUMIN 3.2* 3.3*   Recent Labs  Lab 08/26/23 2228  LIPASE 27   No results for input(s): "AMMONIA" in the last 168 hours.  Coagulation Profile: No results for input(s): "INR", "PROTIME" in the last 168 hours.  Cardiac Enzymes: No results for input(s): "CKTOTAL",  "CKMB", "CKMBINDEX", "TROPONINI" in the last 168 hours.  BNP (last 3 results) No results for input(s): "PROBNP" in the last 8760 hours.  Lipid Profile: No results for input(s): "CHOL", "HDL", "LDLCALC", "TRIG", "CHOLHDL", "LDLDIRECT" in the last 72 hours.  Thyroid  Function Tests: No results for input(s): "TSH", "T4TOTAL", "FREET4", "T3FREE", "THYROIDAB" in the last 72 hours.  Anemia Panel: No results for input(s): "VITAMINB12", "FOLATE", "FERRITIN", "TIBC", "IRON ", "RETICCTPCT" in the last 72 hours.  Urine analysis:    Component Value Date/Time   COLORURINE YELLOW 06/23/2023 1915   APPEARANCEUR HAZY (A) 06/23/2023 1915   APPEARANCEUR Hazy 11/10/2013 2043   LABSPEC 1.019 06/23/2023 1915   LABSPEC 1.031 11/10/2013 2043   PHURINE 8.0 06/23/2023 1915   GLUCOSEU 150 (A) 06/23/2023 1915  GLUCOSEU >=500 11/10/2013 2043   HGBUR NEGATIVE 06/23/2023 1915   BILIRUBINUR NEGATIVE 06/23/2023 1915   BILIRUBINUR negative 06/04/2018 1035   BILIRUBINUR Negative 11/24/2015 1443   BILIRUBINUR Negative 11/10/2013 2043   KETONESUR 5 (A) 06/23/2023 1915   PROTEINUR >=300 (A) 06/23/2023 1915   UROBILINOGEN 0.2 09/14/2019 1732   NITRITE NEGATIVE 06/23/2023 1915   LEUKOCYTESUR NEGATIVE 06/23/2023 1915   LEUKOCYTESUR 1+ 11/10/2013 2043    Sepsis Labs: Lactic Acid, Venous    Component Value Date/Time   LATICACIDVEN 3.2 (HH) 06/30/2023 1102    MICROBIOLOGY: Recent Results (from the past 240 hours)  MRSA Next Gen by PCR, Nasal     Status: None   Collection Time: 08/28/23  4:08 AM   Specimen: Nasal Mucosa; Nasal Swab  Result Value Ref Range Status   MRSA by PCR Next Gen NOT DETECTED NOT DETECTED Final    Comment: (NOTE) The GeneXpert MRSA Assay (FDA approved for NASAL specimens only), is one component of a comprehensive MRSA colonization surveillance program. It is not intended to diagnose MRSA infection nor to guide or monitor treatment for MRSA infections. Test performance is not FDA  approved in patients less than 43 years old. Performed at [facility], [address], [place], [place] [address]   Body fluid culture w Gram Stain     Status: None (Preliminary result)   Collection Time: 08/28/23 12:30 PM   Specimen: Abdomen; Peritoneal Fluid  Result Value Ref Range Status   Specimen Description PERITONEAL  Final   Special Requests NONE  Final   Gram Stain NO WBC SEEN NO ORGANISMS SEEN   Final   Culture   Final    NO GROWTH < 24 HOURS Performed at [facility], [address] [place], [place] [address]    Report Status PENDING  Incomplete    RADIOLOGY STUDIES/RESULTS: ECHOCARDIOGRAM COMPLETE Result Date: 08/29/2023    ECHOCARDIOGRAM REPORT   Patient Name:   ANNETT DUNNAVANT [facility] Date of Exam: 08/29/2023 Medical Rec #:  [id number]                    Height:       63.0 in Accession #:    [id number]                   Weight:       149.7 lb Date of Birth:  Sep 08, 1990                     BSA:          1.710 m Patient Age:    33 years                     BP:           147/85 mmHg Patient Gender: F                            HR:           88 bpm. Exam Location:  Inpatient Procedure: 2D Echo, 3D Echo, Cardiac Doppler, Color Doppler and Strain Analysis            (Both Spectral and Color Flow Doppler were utilized during            procedure). Indications:    Nonischemic cardiomyopathy  History:        Patient has prior history of Echocardiogram examinations,  most                 recent 07/01/2023. Risk Factors:Dyslipidemia and Hypertension.                 ESRD.  Sonographer:    Juanita Shaw Referring Phys: [id number] Tylene Galla M Destanie Tibbetts IMPRESSIONS  1. Left ventricular ejection fraction, by estimation, is 30 to 35%. Left ventricular ejection fraction by 3D volume is 34 %. Left ventricular ejection fraction by 2D MOD biplane is 29.6 %. The left ventricle has moderately decreased function. The left ventricle demonstrates global hypokinesis. Left ventricular diastolic parameters  are consistent with Grade II diastolic dysfunction (pseudonormalization).  2. Right ventricular systolic function is normal. The right ventricular size is mildly enlarged. There is normal pulmonary artery systolic pressure. The estimated right ventricular systolic pressure is 34.4 mmHg.  3. Left atrial size was mildly dilated.  4. Right atrial size was mildly dilated.  5. The mitral valve is normal in structure. No evidence of mitral valve regurgitation. No evidence of mitral stenosis.  6. The tricuspid valve is abnormal. Tricuspid valve regurgitation is severe.  7. The aortic valve is tricuspid. Aortic valve regurgitation is not visualized. No aortic stenosis is present.  8. The inferior vena cava is normal in size with <50% respiratory variability, suggesting right atrial pressure of 8 mmHg.  9. A small pericardial effusion is present. The pericardial effusion is circumferential. FINDINGS  Left Ventricle: Left ventricular ejection fraction, by estimation, is 30 to 35%. Left ventricular ejection fraction by 2D MOD biplane is 29.6 %. Left ventricular ejection fraction by 3D volume is 34 %. The left ventricle has moderately decreased function. The left ventricle demonstrates global hypokinesis. The left ventricular internal cavity size was normal in size. There is no left ventricular hypertrophy. Left ventricular diastolic parameters are consistent with Grade II diastolic dysfunction  (pseudonormalization). Right Ventricle: The right ventricular size is mildly enlarged. No increase in right ventricular wall thickness. Right ventricular systolic function is normal. There is normal pulmonary artery systolic pressure. The tricuspid regurgitant velocity is 2.57  m/s, and with an assumed right atrial pressure of 8 mmHg, the estimated right ventricular systolic pressure is 34.4 mmHg. Left Atrium: Left atrial size was mildly dilated. Right Atrium: Right atrial size was mildly dilated. Pericardium: A small pericardial  effusion is present. The pericardial effusion is circumferential. Mitral Valve: The mitral valve is normal in structure. No evidence of mitral valve regurgitation. No evidence of mitral valve stenosis. MV peak gradient, 5.7 mmHg. The mean mitral valve gradient is 2.0 mmHg. Tricuspid Valve: The tricuspid valve is abnormal. Tricuspid valve regurgitation is severe. Aortic Valve: The aortic valve is tricuspid. Aortic valve regurgitation is not visualized. No aortic stenosis is present. Aortic valve mean gradient measures 6.0 mmHg. Aortic valve peak gradient measures 9.0 mmHg. Aortic valve area, by VTI measures 1.38 cm. Pulmonic Valve: The pulmonic valve was normal in structure. Pulmonic valve regurgitation is mild. Aorta: The aortic root is normal in size and structure. Venous: The inferior vena cava is normal in size with less than 50% respiratory variability, suggesting right atrial pressure of 8 mmHg. IAS/Shunts: No atrial level shunt detected by color flow Doppler. Additional Comments: 3D was performed not requiring image post processing on an independent workstation and was abnormal.  LEFT VENTRICLE PLAX 2D                        Biplane EF (MOD) LVIDd:  4.40 cm         LV Biplane EF:   Left LVIDs:         3.90 cm                          ventricular LV PW:         0.80 cm                          ejection LV IVS:        0.90 cm                          fraction by LVOT diam:     1.70 cm                          2D MOD LV SV:         42                               biplane is LV SV Index:   25                               29.6 %. LVOT Area:     2.27 cm                                Diastology                                LV e' medial:    6.08 cm/s LV Volumes (MOD)               LV E/e' medial:  18.6 LV vol d, MOD    157.0 ml      LV e' lateral:   9.00 cm/s A2C:                           LV E/e' lateral: 12.6 LV vol d, MOD    117.0 ml A4C: LV vol s, MOD    97.9 ml       3D Volume EF A2C:                            LV 3D EF:    Left LV vol s, MOD    93.4 ml                    ventricul A4C:                                        ar LV SV MOD A2C:   59.1 ml                    ejection LV SV MOD A4C:   117.0 ml                   fraction LV SV MOD BP:    40.5 ml  by 3D                                             volume is                                             34 %.                                 3D Volume EF:                                3D EF:        34 %                                LV EDV:       127 ml                                LV ESV:       83 ml                                LV SV:        44 ml RIGHT VENTRICLE             IVC RV Basal diam:  4.10 cm     IVC diam: 1.70 cm RV Mid diam:    3.70 cm RV S prime:     12.20 cm/s TAPSE (M-mode): 2.0 cm LEFT ATRIUM             Index        RIGHT ATRIUM           Index LA diam:        3.40 cm 1.99 cm/m   RA Area:     22.80 cm LA Vol (A2C):   65.8 ml 38.49 ml/m  RA Volume:   71.80 ml  42.00 ml/m LA Vol (A4C):   45.3 ml 26.50 ml/m LA Biplane Vol: 55.7 ml 32.58 ml/m  AORTIC VALVE                     PULMONIC VALVE AV Area (Vmax):    1.31 cm      PV Vmax:          1.46 m/s AV Area (Vmean):   1.19 cm      PV Peak grad:     8.6 mmHg AV Area (VTI):     1.38 cm      PR End Diast Vel: 8.29 msec AV Vmax:           150.00 cm/s AV Vmean:          114.000 cm/s AV VTI:            0.305 m AV Peak Grad:      9.0 mmHg AV Mean Grad:      6.0 mmHg LVOT Vmax:  86.90 cm/s LVOT Vmean:        59.700 cm/s LVOT VTI:          0.186 m LVOT/AV VTI ratio: 0.61  AORTA Ao Root diam: 2.70 cm Ao Asc diam:  2.60 cm MITRAL VALVE                TRICUSPID VALVE MV Area (PHT): 4.01 cm     TR Peak grad:   26.4 mmHg MV Area VTI:   2.22 cm     TR Vmax:        257.00 cm/s MV Peak grad:  5.7 mmHg MV Mean grad:  2.0 mmHg     SHUNTS MV Vmax:       1.19 m/s     Systemic VTI:  0.19 m MV Vmean:      70.4 cm/s    Systemic Diam: 1.70 cm MV Decel Time: 189 msec MV E  velocity: 113.00 cm/s MV A velocity: 93.30 cm/s MV E/A ratio:  1.21 Dalton McleanMD Electronically signed by Archer Bear Signature Date/Time: 08/29/2023/11:07:43 AM    Final    IR Paracentesis Result Date: 08/28/2023 INDICATION: 33 year old female with type 1 diabetes, end-stage renal disease on dialysis, new ascites. Request for diagnostic and therapeutic paracentesis. EXAM: ULTRASOUND GUIDED DIAGNOSTIC AND THERAPEUTIC PARACENTESIS MEDICATIONS: 10 mL 1% lidocaine  COMPLICATIONS: None immediate. PROCEDURE: Informed written consent was obtained from the patient after a discussion of the risks, benefits and alternatives to treatment. A timeout was performed prior to the initiation of the procedure. Initial ultrasound scanning demonstrates a large amount of ascites within the left lateral abdomen. The left ladder abdomen was prepped and draped in the usual sterile fashion. 1% lidocaine  was used for local anesthesia. Following this, a 19 gauge, 7-cm, Yueh catheter was introduced. An ultrasound image was saved for documentation purposes. The paracentesis was performed. The catheter was removed and a dressing was applied. The patient tolerated the procedure well without immediate post procedural complication. FINDINGS: A total of approximately 2.5 liters of amber fluid was removed. Samples were sent to the laboratory as requested by the clinical team. IMPRESSION: Successful ultrasound-guided paracentesis yielding 2.5 liters of peritoneal fluid. Performed by: Kacie Matthews PA-C Electronically Signed   By: Creasie Doctor M.D.   On: 08/28/2023 14:33     LOS: 1 day   Kimberly Penna, MD  [facility]    To contact the attending provider between 7A-7P or the covering provider during after hours 7P-7A, please log into the web site [email or web address] and access using universal [place] password for that web site. If you do not have the password, please call the hospital operator.  08/29/2023, 11:10 AM

## 2023-08-29 NOTE — Progress Notes (Signed)
 Pt receives out-pt HD at [facility] on TTS 11:00 arrival for 11:15 chair time. Will assist as needed.   Lauraine Polite Renal Navigator [phone]

## 2023-08-30 ENCOUNTER — Inpatient Hospital Stay (HOSPITAL_COMMUNITY)

## 2023-08-30 DIAGNOSIS — E875 Hyperkalemia: Secondary | ICD-10-CM | POA: Diagnosis not present

## 2023-08-30 DIAGNOSIS — E1010 Type 1 diabetes mellitus with ketoacidosis without coma: Principal | ICD-10-CM

## 2023-08-30 DIAGNOSIS — I5042 Chronic combined systolic (congestive) and diastolic (congestive) heart failure: Secondary | ICD-10-CM | POA: Diagnosis not present

## 2023-08-30 DIAGNOSIS — N186 End stage renal disease: Secondary | ICD-10-CM | POA: Diagnosis not present

## 2023-08-30 LAB — GLUCOSE, CAPILLARY
Glucose-Capillary: 210 mg/dL — ABNORMAL HIGH (ref 70–99)
Glucose-Capillary: 157 mg/dL — ABNORMAL HIGH (ref 70–99)
Glucose-Capillary: 160 mg/dL — ABNORMAL HIGH (ref 70–99)
Glucose-Capillary: 113 mg/dL — ABNORMAL HIGH (ref 70–99)
Glucose-Capillary: 178 mg/dL — ABNORMAL HIGH (ref 70–99)
Glucose-Capillary: 139 mg/dL — ABNORMAL HIGH (ref 70–99)
Glucose-Capillary: 247 mg/dL — ABNORMAL HIGH (ref 70–99)
Glucose-Capillary: 350 mg/dL — ABNORMAL HIGH (ref 70–99)
Glucose-Capillary: 334 mg/dL — ABNORMAL HIGH (ref 70–99)
Glucose-Capillary: 304 mg/dL — ABNORMAL HIGH (ref 70–99)
Glucose-Capillary: 244 mg/dL — ABNORMAL HIGH (ref 70–99)

## 2023-08-30 LAB — HEPATITIS B SURFACE ANTIGEN: Hepatitis B Surface Ag: NONREACTIVE

## 2023-08-30 MED ORDER — CHLORHEXIDINE GLUCONATE CLOTH 2 % EX PADS
6.0000 | MEDICATED_PAD | Freq: Every day | CUTANEOUS | Status: DC
  Administered 2023-08-30 – 2023-08-31 (×2): 6 via TOPICAL

## 2023-08-30 MED ORDER — HYDROMORPHONE HCL 1 MG/ML IJ SOLN
0.5000 mg | Freq: Once | INTRAMUSCULAR | Status: DC
  Filled 2023-08-30: qty 0.5

## 2023-08-30 MED ORDER — SEVELAMER CARBONATE 800 MG PO TABS
3200.0000 mg | ORAL_TABLET | Freq: Three times a day (TID) | ORAL | Status: DC
  Administered 2023-08-30 – 2023-09-01 (×6): 3200 mg via ORAL
  Filled 2023-08-30 (×6): qty 4

## 2023-08-30 MED ORDER — LORAZEPAM 2 MG/ML IJ SOLN
1.0000 mg | Freq: Once | INTRAMUSCULAR | Status: AC | PRN
  Administered 2023-08-30: 1 mg via INTRAVENOUS
  Filled 2023-08-30: qty 0.5

## 2023-08-30 MED ORDER — CYCLOBENZAPRINE HCL 5 MG PO TABS
5.0000 mg | ORAL_TABLET | Freq: Three times a day (TID) | ORAL | Status: DC | PRN
Start: 2023-08-30 — End: 2023-09-02
  Administered 2023-09-02: 5 mg via ORAL
  Filled 2023-08-30: qty 1

## 2023-08-30 MED ORDER — LORAZEPAM 2 MG/ML IJ SOLN
INTRAMUSCULAR | Status: AC
  Filled 2023-08-30: qty 1

## 2023-08-30 MED ORDER — INSULIN GLARGINE-YFGN 100 UNIT/ML ~~LOC~~ SOLN
10.0000 [IU] | Freq: Every day | SUBCUTANEOUS | Status: DC
Start: 2023-08-30 — End: 2023-09-01
  Administered 2023-08-30 – 2023-08-31 (×2): 10 [IU] via SUBCUTANEOUS
  Filled 2023-08-30 (×2): qty 0.1

## 2023-08-30 NOTE — Progress Notes (Addendum)
 PROGRESS NOTE        PATIENT DETAILS Name: Ashley Freeman Age: 33 y.o. Sex: female Date of Birth: [birth year] Admit Date: 08/26/2023 Admitting Physician Tylene Galla Donzell Gallery, MD ZOX:[id number], [name], MD  Brief Summary: Patient is a 33 y.o.  female with history of ESRD on HD TTS, chronic HFrEF, DM-1, s/p recent cholecystectomy on 3/4-who presented with abdominal pain/distention-found to have DKA and subsequently admitted to the hospitalist service.  Significant events: 4/28>> admit to [facility]  Significant studies: 3/03>> echo: EF 25-30%, grade 2 diastolic dysfunction 4/29>> CT abdomen/pelvis: Large volume ascites, small bilateral pleural effusions, diffuse body wall edema. 5/1>> echo: EF 30-35%, grade 2 diastolic dysfunction.  Significant microbiology data: 4/30>> peritoneal fluid culture: Negative  Procedures: 4/30>> paracentesis (WBC 306-with 53% lymphocytes, 46% monocytes, 1% neutrophils, albumin 2.1)  Consults: Nephrology  Subjective: Some mild abdominal pain.  No other complaints.  CBGs stable since yesterday afternoon.  Objective: Vitals: Blood pressure 130/88, pulse 75, temperature 98 F (36.7 C), temperature source Oral, resp. rate 16, height 5\' 3"  (1.6 m), weight 67.9 kg, SpO2 96%, unknown if currently breastfeeding.   Exam: Gen Exam:Alert awake-not in any distress HEENT:atraumatic, normocephalic Chest: B/L clear to auscultation anteriorly CVS:S1S2 regular Abdomen: Soft-mild dullness in the flanks-minimal distention.  Mild but diffuse tenderness. Extremities:no edema Neurology: Non focal Skin: no rash  Pertinent Labs/Radiology:    Latest Ref Rng & Units 08/27/2023    5:26 AM 08/26/2023   10:28 PM 08/14/2023   12:30 PM  CBC  WBC 4.0 - 10.5 K/uL 9.1  6.8  6.7   Hemoglobin 12.0 - 15.0 g/dL 40.9  81.1  91.4   Hematocrit 36.0 - 46.0 % 41.5  41.7  41.7   Platelets 150 - 400 K/uL 189  159  196     Lab Results  Component  Value Date   NA 140 08/29/2023   K 4.3 08/29/2023   CL 101 08/29/2023   CO2 21 (L) 08/29/2023    Assessment/Plan: DKA Resolved with IV insulin  Has been transition to SQ insulin -see below  Abdominal pain-worsening ascites Significant improvement in abdominal pain-some mild abdominal pain persists. S/p paracentesis on 4/30-predominantly lymphocytic-cytology negative-peritoneal fluid cultures negative so far. Etiology likely reflective of portal hypertension in setting of CHF/ESRD.  Continue to remove volume with HD.  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  Recent Labs    08/30/23 0555 08/30/23 0806 08/30/23 0958  GLUCAP 160* 113* 178*    ESRD on HD TTS Nephrology following  Acute on Chronic HFrEF (EF 25-30% by echo on 3/3) Appears volume overloaded-has leg edema-evidence of ascites/pleural effusion on imaging studies Claims she has been compliant with HD Volume removal with HD Continue Coreg /losartan .  Prolonged QTc Mildly long QTc-less than 500 ms on repeat twelve-lead EKG Monitor on telemetry.  Mood disorder Discussed with patient-she is no longer on Prozac  Cymbalta /duloxetine /methylphenidate has been resumed. .  Code status:   Code Status: Full Code   DVT Prophylaxis: heparin  injection 5,000 Units Start: 08/28/23 1400 SCDs Start: 08/27/23 0507   Family Communication: None at bedside   Disposition Plan: Status is: Observation The patient will require care spanning > 2 midnights and should  be moved to inpatient because: Severity of illness   Planned Discharge Destination:Home on 5/3 if remains clinically stable.   Diet: Diet Order             Diet Carb Modified Fluid consistency: Thin; Room  service appropriate? Yes  Diet effective now                     Antimicrobial agents: Anti-infectives (From admission, onward)    None        MEDICATIONS: Scheduled Meds:  carvedilol   25 mg Oral BID WC   Chlorhexidine  Gluconate Cloth  6 each Topical Q0600   Chlorhexidine  Gluconate Cloth  6 each Topical Q0600   DULoxetine   20 mg Oral Daily   glucagon  (human recombinant)  1 mg Intramuscular STAT   heparin  injection (subcutaneous)  5,000 Units Subcutaneous Q8H   insulin  aspart  0-6 Units Subcutaneous TID WC   insulin  glargine-yfgn  10 Units Subcutaneous QHS   losartan   50 mg Oral Daily   polyethylene glycol  17 g Oral Daily   rosuvastatin   10 mg Oral Daily   sevelamer  carbonate  3,200 mg Oral TID WC   Continuous Infusions: PRN Meds:.acetaminophen  **OR** acetaminophen , dextrose , HYDROmorphone  (DILAUDID ) injection, hydrOXYzine , naLOXone  (NARCAN )  injection   I have personally reviewed following labs and imaging studies  LABORATORY DATA: CBC: Recent Labs  Lab 08/26/23 2228 08/27/23 0526  WBC 6.8 9.1  HGB 12.1 12.2  HCT 41.7 41.5  MCV 94.6 92.4  PLT 159 189    Basic Metabolic Panel: Recent Labs  Lab 08/26/23 2228 08/27/23 0218 08/27/23 0503 08/27/23 0526 08/27/23 1205 08/29/23 1150  NA 129* 134* 139  --  136 140  K 5.4* 4.3 4.3  --  4.6 4.3  CL 96* 100 103  --  102 101  CO2 15* 20* 24  --  20* 21*  GLUCOSE 1,022* 530* 149*  --  158* 67*  BUN 46* 49* 46*  --  48* 51*  CREATININE 6.00* 6.35* 6.10*  --  6.32* 6.59*  CALCIUM  7.5* 7.8* 7.7*  --  7.6* 8.1*  MG  --   --   --  2.4  --   --   PHOS  --   --   --   --   --  4.4    GFR: Estimated Creatinine Clearance: 11.4 mL/min (A) (by C-G formula based on SCr of 6.59 mg/dL (H)).  Liver Function Tests: Recent Labs  Lab 08/26/23 2228 08/28/23 1035 08/29/23 1150  AST 46*  --   --   ALT 49*  --   --   ALKPHOS 362*  --   --   BILITOT 1.2  --   --   PROT 6.2*  --   --   ALBUMIN 3.2* 3.3* 3.4*    Recent Labs  Lab 08/26/23 2228  LIPASE 27   No results for input(s): "AMMONIA" in the last 168 hours.  Coagulation Profile: No results for input(s): "INR", "PROTIME" in the last 168 hours.  Cardiac Enzymes: No results for input(s): "CKTOTAL", "CKMB", "CKMBINDEX", "TROPONINI" in the last 168 hours.  BNP (last 3 results) No results for input(s): "PROBNP" in the last 8760 hours.  Lipid Profile: No results for input(s): "CHOL", "HDL", "LDLCALC", "TRIG", "CHOLHDL", "LDLDIRECT" in the last 72 hours.  Thyroid  Function Tests: No results for input(s): "TSH", "T4TOTAL", "FREET4", "T3FREE", "THYROIDAB" in the last 72 hours.  Anemia Panel: No results for input(s): "VITAMINB12", "FOLATE", "FERRITIN", "TIBC", "IRON ", "  RETICCTPCT" in the last 72 hours.  Urine analysis:    Component Value Date/Time   COLORURINE YELLOW 06/23/2023 1915   APPEARANCEUR HAZY (A) 06/23/2023 1915   APPEARANCEUR Hazy 11/10/2013 2043   LABSPEC 1.019 06/23/2023 1915   LABSPEC 1.031 11/10/2013 2043   PHURINE 8.0 06/23/2023 1915   GLUCOSEU 150 (A) 06/23/2023 1915   GLUCOSEU >=500 11/10/2013 2043   HGBUR NEGATIVE 06/23/2023 1915   BILIRUBINUR NEGATIVE 06/23/2023 1915   BILIRUBINUR negative 06/04/2018 1035   BILIRUBINUR Negative 11/24/2015 1443   BILIRUBINUR Negative 11/10/2013 2043   KETONESUR 5 (A) 06/23/2023 1915   PROTEINUR >=300 (A) 06/23/2023 1915   UROBILINOGEN 0.2 09/14/2019 1732   NITRITE NEGATIVE 06/23/2023 1915   LEUKOCYTESUR NEGATIVE 06/23/2023 1915   LEUKOCYTESUR 1+ 11/10/2013 2043    Sepsis Labs: Lactic Acid, Venous    Component Value Date/Time   LATICACIDVEN 3.2 (HH) 06/30/2023 1102    MICROBIOLOGY: Recent Results (from the past 240 hours)  MRSA Next Gen by PCR, Nasal     Status: None   Collection Time: 08/28/23  4:08 AM   Specimen: Nasal Mucosa; Nasal Swab  Result Value Ref Range Status   MRSA by PCR Next Gen NOT DETECTED NOT DETECTED Final    Comment: (NOTE) The GeneXpert MRSA  Assay (FDA approved for NASAL specimens only), is one component of a comprehensive MRSA colonization surveillance program. It is not intended to diagnose MRSA infection nor to guide or monitor treatment for MRSA infections. Test performance is not FDA approved in patients less than 96 years old. Performed at [facility], [address], [place], [place] [address]   Body fluid culture w Gram Stain     Status: None (Preliminary result)   Collection Time: 08/28/23 12:30 PM   Specimen: Abdomen; Peritoneal Fluid  Result Value Ref Range Status   Specimen Description PERITONEAL  Final   Special Requests NONE  Final   Gram Stain NO WBC SEEN NO ORGANISMS SEEN   Final   Culture   Final    NO GROWTH 2 DAYS Performed at [name], [address], [place], [place] [address]    Report Status PENDING  Incomplete    RADIOLOGY STUDIES/RESULTS: ECHOCARDIOGRAM COMPLETE Result Date: 08/29/2023    ECHOCARDIOGRAM REPORT   Patient Name:   Ashley Freeman [facility] Date of Exam: 08/29/2023 Medical Rec #:  [id number]                    Height:       63.0 in Accession #:    [id number]                   Weight:       149.7 lb Date of Birth:  October 23, 1990                     BSA:          1.710 m Patient Age:    33 years                     BP:           147/85 mmHg Patient Gender: F                            HR:           88 bpm. Exam Location:  Inpatient Procedure: 2D Echo, 3D Echo, Cardiac  Doppler, Color Doppler and Strain Analysis            (Both Spectral and Color Flow Doppler were utilized during            procedure). Indications:    Nonischemic cardiomyopathy  History:        Patient has prior history of Echocardiogram examinations, most                 recent 07/01/2023. Risk Factors:Dyslipidemia and Hypertension.                 ESRD.  Sonographer:    Juanita Shaw Referring Phys: [id number] Tylene Galla M Ouida Abeyta IMPRESSIONS  1. Left ventricular ejection fraction, by estimation, is 30 to 35%. Left  ventricular ejection fraction by 3D volume is 34 %. Left ventricular ejection fraction by 2D MOD biplane is 29.6 %. The left ventricle has moderately decreased function. The left ventricle demonstrates global hypokinesis. Left ventricular diastolic parameters are consistent with Grade II diastolic dysfunction (pseudonormalization).  2. Right ventricular systolic function is normal. The right ventricular size is mildly enlarged. There is normal pulmonary artery systolic pressure. The estimated right ventricular systolic pressure is 34.4 mmHg.  3. Left atrial size was mildly dilated.  4. Right atrial size was mildly dilated.  5. The mitral valve is normal in structure. No evidence of mitral valve regurgitation. No evidence of mitral stenosis.  6. The tricuspid valve is abnormal. Tricuspid valve regurgitation is severe.  7. The aortic valve is tricuspid. Aortic valve regurgitation is not visualized. No aortic stenosis is present.  8. The inferior vena cava is normal in size with <50% respiratory variability, suggesting right atrial pressure of 8 mmHg.  9. A small pericardial effusion is present. The pericardial effusion is circumferential. FINDINGS  Left Ventricle: Left ventricular ejection fraction, by estimation, is 30 to 35%. Left ventricular ejection fraction by 2D MOD biplane is 29.6 %. Left ventricular ejection fraction by 3D volume is 34 %. The left ventricle has moderately decreased function. The left ventricle demonstrates global hypokinesis. The left ventricular internal cavity size was normal in size. There is no left ventricular hypertrophy. Left ventricular diastolic parameters are consistent with Grade II diastolic dysfunction  (pseudonormalization). Right Ventricle: The right ventricular size is mildly enlarged. No increase in right ventricular wall thickness. Right ventricular systolic function is normal. There is normal pulmonary artery systolic pressure. The tricuspid regurgitant velocity is 2.57   m/s, and with an assumed right atrial pressure of 8 mmHg, the estimated right ventricular systolic pressure is 34.4 mmHg. Left Atrium: Left atrial size was mildly dilated. Right Atrium: Right atrial size was mildly dilated. Pericardium: A small pericardial effusion is present. The pericardial effusion is circumferential. Mitral Valve: The mitral valve is normal in structure. No evidence of mitral valve regurgitation. No evidence of mitral valve stenosis. MV peak gradient, 5.7 mmHg. The mean mitral valve gradient is 2.0 mmHg. Tricuspid Valve: The tricuspid valve is abnormal. Tricuspid valve regurgitation is severe. Aortic Valve: The aortic valve is tricuspid. Aortic valve regurgitation is not visualized. No aortic stenosis is present. Aortic valve mean gradient measures 6.0 mmHg. Aortic valve peak gradient measures 9.0 mmHg. Aortic valve area, by VTI measures 1.38 cm. Pulmonic Valve: The pulmonic valve was normal in structure. Pulmonic valve regurgitation is mild. Aorta: The aortic root is normal in size and structure. Venous: The inferior vena cava is normal in size with less than 50% respiratory variability, suggesting right atrial pressure of 8 mmHg. IAS/Shunts: No  atrial level shunt detected by color flow Doppler. Additional Comments: 3D was performed not requiring image post processing on an independent workstation and was abnormal.  LEFT VENTRICLE PLAX 2D                        Biplane EF (MOD) LVIDd:         4.40 cm         LV Biplane EF:   Left LVIDs:         3.90 cm                          ventricular LV PW:         0.80 cm                          ejection LV IVS:        0.90 cm                          fraction by LVOT diam:     1.70 cm                          2D MOD LV SV:         42                               biplane is LV SV Index:   25                               29.6 %. LVOT Area:     2.27 cm                                Diastology                                LV e' medial:    6.08 cm/s  LV Volumes (MOD)               LV E/e' medial:  18.6 LV vol d, MOD    157.0 ml      LV e' lateral:   9.00 cm/s A2C:                           LV E/e' lateral: 12.6 LV vol d, MOD    117.0 ml A4C: LV vol s, MOD    97.9 ml       3D Volume EF A2C:                           LV 3D EF:    Left LV vol s, MOD    93.4 ml                    ventricul A4C:                                        ar LV SV MOD A2C:  59.1 ml                    ejection LV SV MOD A4C:   117.0 ml                   fraction LV SV MOD BP:    40.5 ml                    by 3D                                             volume is                                             34 %.                                 3D Volume EF:                                3D EF:        34 %                                LV EDV:       127 ml                                LV ESV:       83 ml                                LV SV:        44 ml RIGHT VENTRICLE             IVC RV Basal diam:  4.10 cm     IVC diam: 1.70 cm RV Mid diam:    3.70 cm RV S prime:     12.20 cm/s TAPSE (M-mode): 2.0 cm LEFT ATRIUM             Index        RIGHT ATRIUM           Index LA diam:        3.40 cm 1.99 cm/m   RA Area:     22.80 cm LA Vol (A2C):   65.8 ml 38.49 ml/m  RA Volume:   71.80 ml  42.00 ml/m LA Vol (A4C):   45.3 ml 26.50 ml/m LA Biplane Vol: 55.7 ml 32.58 ml/m  AORTIC VALVE                     PULMONIC VALVE AV Area (Vmax):    1.31 cm      PV Vmax:          1.46 m/s AV Area (Vmean):   1.19 cm      PV Peak grad:     8.6 mmHg AV Area (VTI):     1.38 cm  PR End Diast Vel: 8.29 msec AV Vmax:           150.00 cm/s AV Vmean:          114.000 cm/s AV VTI:            0.305 m AV Peak Grad:      9.0 mmHg AV Mean Grad:      6.0 mmHg LVOT Vmax:         86.90 cm/s LVOT Vmean:        59.700 cm/s LVOT VTI:          0.186 m LVOT/AV VTI ratio: 0.61  AORTA Ao Root diam: 2.70 cm Ao Asc diam:  2.60 cm MITRAL VALVE                TRICUSPID VALVE MV Area (PHT): 4.01 cm     TR Peak grad:    26.4 mmHg MV Area VTI:   2.22 cm     TR Vmax:        257.00 cm/s MV Peak grad:  5.7 mmHg MV Mean grad:  2.0 mmHg     SHUNTS MV Vmax:       1.19 m/s     Systemic VTI:  0.19 m MV Vmean:      70.4 cm/s    Systemic Diam: 1.70 cm MV Decel Time: 189 msec MV E velocity: 113.00 cm/s MV A velocity: 93.30 cm/s MV E/A ratio:  1.21 Dalton McleanMD Electronically signed by Archer Bear Signature Date/Time: 08/29/2023/11:07:43 AM    Final    IR Paracentesis Result Date: 08/28/2023 INDICATION: 33 year old female with type 1 diabetes, end-stage renal disease on dialysis, new ascites. Request for diagnostic and therapeutic paracentesis. EXAM: ULTRASOUND GUIDED DIAGNOSTIC AND THERAPEUTIC PARACENTESIS MEDICATIONS: 10 mL 1% lidocaine  COMPLICATIONS: None immediate. PROCEDURE: Informed written consent was obtained from the patient after a discussion of the risks, benefits and alternatives to treatment. A timeout was performed prior to the initiation of the procedure. Initial ultrasound scanning demonstrates a large amount of ascites within the left lateral abdomen. The left ladder abdomen was prepped and draped in the usual sterile fashion. 1% lidocaine  was used for local anesthesia. Following this, a 19 gauge, 7-cm, Yueh catheter was introduced. An ultrasound image was saved for documentation purposes. The paracentesis was performed. The catheter was removed and a dressing was applied. The patient tolerated the procedure well without immediate post procedural complication. FINDINGS: A total of approximately 2.5 liters of amber fluid was removed. Samples were sent to the laboratory as requested by the clinical team. IMPRESSION: Successful ultrasound-guided paracentesis yielding 2.5 liters of peritoneal fluid. Performed by: Kacie Matthews PA-C Electronically Signed   By: Creasie Doctor M.D.   On: 08/28/2023 14:33     LOS: 2 days   Kimberly Penna, MD  [facility]    To contact the attending provider between 7A-7P or  the covering provider during after hours 7P-7A, please log into the web site [email or web address] and access using universal [place] password for that web site. If you do not have the password, please call the hospital operator.  08/30/2023, 11:03 AM

## 2023-08-30 NOTE — Plan of Care (Signed)
 Problem: Education: Goal: Ability to describe self-care measures that may prevent or decrease complications (Diabetes Survival Skills Education) will improve 08/30/2023 0238 by Ulice Gamer, RN Outcome: Progressing 08/30/2023 0238 by Ulice Gamer, RN Outcome: Progressing Goal: Individualized Educational Video(s) 08/30/2023 0238 by Ulice Gamer, RN Outcome: Progressing 08/30/2023 0238 by Ulice Gamer, RN Outcome: Progressing   Problem: Coping: Goal: Ability to adjust to condition or change in health will improve 08/30/2023 0238 by Ulice Gamer, RN Outcome: Progressing 08/30/2023 0238 by Ulice Gamer, RN Outcome: Progressing   Problem: Fluid Volume: Goal: Ability to maintain a balanced intake and output will improve 08/30/2023 0238 by Ulice Gamer, RN Outcome: Progressing 08/30/2023 0238 by Ulice Gamer, RN Outcome: Progressing   Problem: Health Behavior/Discharge Planning: Goal: Ability to identify and utilize available resources and services will improve 08/30/2023 0238 by Ulice Gamer, RN Outcome: Progressing 08/30/2023 0238 by Ulice Gamer, RN Outcome: Progressing Goal: Ability to manage health-related needs will improve 08/30/2023 0238 by Ulice Gamer, RN Outcome: Progressing 08/30/2023 0238 by Ulice Gamer, RN Outcome: Progressing   Problem: Metabolic: Goal: Ability to maintain appropriate glucose levels will improve 08/30/2023 0238 by Ulice Gamer, RN Outcome: Progressing 08/30/2023 0238 by Ulice Gamer, RN Outcome: Progressing   Problem: Nutritional: Goal: Maintenance of adequate nutrition will improve 08/30/2023 0238 by Ulice Gamer, RN Outcome: Progressing 08/30/2023 0238 by Ulice Gamer, RN Outcome: Progressing Goal: Progress toward achieving an optimal weight will improve 08/30/2023 0238 by Ulice Gamer, RN Outcome: Progressing 08/30/2023 0238 by Ulice Gamer, RN Outcome: Progressing   Problem: Skin Integrity: Goal: Risk for impaired skin  integrity will decrease 08/30/2023 0238 by Ulice Gamer, RN Outcome: Progressing 08/30/2023 0238 by Ulice Gamer, RN Outcome: Progressing   Problem: Tissue Perfusion: Goal: Adequacy of tissue perfusion will improve 08/30/2023 0238 by Ulice Gamer, RN Outcome: Progressing 08/30/2023 0238 by Ulice Gamer, RN Outcome: Progressing   Problem: Education: Goal: Knowledge of General Education information will improve Description: Including pain rating scale, medication(s)/side effects and non-pharmacologic comfort measures 08/30/2023 0238 by Ulice Gamer, RN Outcome: Progressing 08/30/2023 0238 by Ulice Gamer, RN Outcome: Progressing   Problem: Health Behavior/Discharge Planning: Goal: Ability to manage health-related needs will improve 08/30/2023 0238 by Ulice Gamer, RN Outcome: Progressing 08/30/2023 0238 by Ulice Gamer, RN Outcome: Progressing   Problem: Clinical Measurements: Goal: Ability to maintain clinical measurements within normal limits will improve 08/30/2023 0238 by Ulice Gamer, RN Outcome: Progressing 08/30/2023 0238 by Ulice Gamer, RN Outcome: Progressing Goal: Will remain free from infection 08/30/2023 0238 by Ulice Gamer, RN Outcome: Progressing 08/30/2023 0238 by Ulice Gamer, RN Outcome: Progressing Goal: Diagnostic test results will improve 08/30/2023 0238 by Ulice Gamer, RN Outcome: Progressing 08/30/2023 0238 by Ulice Gamer, RN Outcome: Progressing Goal: Respiratory complications will improve 08/30/2023 0238 by Ulice Gamer, RN Outcome: Progressing 08/30/2023 0238 by Ulice Gamer, RN Outcome: Progressing Goal: Cardiovascular complication will be avoided 08/30/2023 0238 by Ulice Gamer, RN Outcome: Progressing 08/30/2023 0238 by Ulice Gamer, RN Outcome: Progressing   Problem: Activity: Goal: Risk for activity intolerance will decrease 08/30/2023 0238 by Ulice Gamer, RN Outcome: Progressing 08/30/2023 0238 by Ulice Gamer, RN Outcome:  Progressing   Problem: Nutrition: Goal: Adequate nutrition will be maintained 08/30/2023 0238 by Ulice Gamer, RN Outcome: Progressing 08/30/2023 0238 by Ulice Gamer, RN Outcome: Progressing  Problem: Coping: Goal: Level of anxiety will decrease 08/30/2023 0238 by Ulice Gamer, RN Outcome: Progressing 08/30/2023 0238 by Ulice Gamer, RN Outcome: Progressing   Problem: Elimination: Goal: Will not experience complications related to bowel motility 08/30/2023 0238 by Ulice Gamer, RN Outcome: Progressing 08/30/2023 0238 by Ulice Gamer, RN Outcome: Progressing Goal: Will not experience complications related to urinary retention 08/30/2023 0238 by Ulice Gamer, RN Outcome: Progressing 08/30/2023 0238 by Ulice Gamer, RN Outcome: Progressing   Problem: Pain Managment: Goal: General experience of comfort will improve and/or be controlled 08/30/2023 0238 by Ulice Gamer, RN Outcome: Progressing 08/30/2023 0238 by Ulice Gamer, RN Outcome: Progressing   Problem: Safety: Goal: Ability to remain free from injury will improve 08/30/2023 0238 by Ulice Gamer, RN Outcome: Progressing 08/30/2023 0238 by Ulice Gamer, RN Outcome: Progressing   Problem: Skin Integrity: Goal: Risk for impaired skin integrity will decrease 08/30/2023 0238 by Ulice Gamer, RN Outcome: Progressing 08/30/2023 0238 by Ulice Gamer, RN Outcome: Progressing

## 2023-08-30 NOTE — Progress Notes (Signed)
 Contacted by providers with inquiry if pt could get out-pt HD at clinic tomorrow post d/c from hospital. Pt goes to [facility] on TTS 11:00 arrival for 11:15 chair time. Contacted clinic who confirms pt's appt for tomorrow is still available. Clinic aware pt may d/c tomorrow morning and proceed to clinic for normal HD appt. Navigator will fax d/c summary to clinic on Monday if pt d/c tomorrow or Sunday. Spoke to pt via phone. Discussed above info. Pt states she uses transportation and she will call them to see if they will pick pt up here at hospital at d/c and transport pt to HD clinic and then home. This info was provided to attending, nephrologist, renal PA, and RN CM. Will assist as needed.   Lauraine Polite Renal Navigator [phone]

## 2023-08-30 NOTE — Progress Notes (Signed)
 Received patient in bed.Alert and oriented x 4. Consent verified.  Access used : Right arm avg that worked well.  Duration of treatment : 1.5 hours only out of 3.5 hours prescribed.  Net uf: 600 cc.  Hemo comment: One and half hour into her treatment,she quit due to her stomach discomfort and multiple bm,thus 600 cc of her net uf.She signed early AMA form.Renal P.a made awrare.  Hand off to the patient's nurse ,into her room via transporter with stable medical condition.

## 2023-08-30 NOTE — Progress Notes (Signed)
 [facility] Progress Note   Subjective:   Still having some abdominal pain today, feels she still has some fluid in abdomen. Denies SOB, CP, dizziness. Asks about resuming her phos binders.   Objective Vitals:   08/29/23 2027 08/29/23 2353 08/30/23 0445 08/30/23 0819  BP: 119/76 123/86 126/86 130/88  Pulse:    75  Resp: (!) 21 15 14 16   Temp: 97.8 F (36.6 C) 98.2 F (36.8 C) 97.9 F (36.6 C) 98 F (36.7 C)  TempSrc: Oral Oral Oral Oral  SpO2:      Weight:      Height:       Physical Exam General: Alert female in NAD Heart: RRR, no murmurs, rubs or gallops Lungs: CTA bilaterally, respirations unlabored  Abdomen: mildly distended, no TTP, +BS Extremities: No edema b/l lower extremities Dialysis Access: RUE AVG + bruit  Additional Objective Labs: Basic Metabolic Panel: Recent Labs  Lab 08/27/23 0503 08/27/23 1205 08/29/23 1150  NA 139 136 140  K 4.3 4.6 4.3  CL 103 102 101  CO2 24 20* 21*  GLUCOSE 149* 158* 67*  BUN 46* 48* 51*  CREATININE 6.10* 6.32* 6.59*  CALCIUM  7.7* 7.6* 8.1*  PHOS  --   --  4.4   Liver Function Tests: Recent Labs  Lab 08/26/23 2228 08/28/23 1035 08/29/23 1150  AST 46*  --   --   ALT 49*  --   --   ALKPHOS 362*  --   --   BILITOT 1.2  --   --   PROT 6.2*  --   --   ALBUMIN 3.2* 3.3* 3.4*   Recent Labs  Lab 08/26/23 2228  LIPASE 27   CBC: Recent Labs  Lab 08/26/23 2228 08/27/23 0526  WBC 6.8 9.1  HGB 12.1 12.2  HCT 41.7 41.5  MCV 94.6 92.4  PLT 159 189   Blood Culture    Component Value Date/Time   SDES PERITONEAL 08/28/2023 1230   SPECREQUEST NONE 08/28/2023 1230   CULT  08/28/2023 1230    NO GROWTH 2 DAYS Performed at [facility], [address] [place], [place] [address]    REPTSTATUS PENDING 08/28/2023 1230    Cardiac Enzymes: No results for input(s): "CKTOTAL", "CKMB", "CKMBINDEX", "TROPONINI" in the last 168 hours. CBG: Recent Labs  Lab 08/30/23 0152 08/30/23 0357  08/30/23 0555 08/30/23 0806 08/30/23 0958  GLUCAP 210* 157* 160* 113* 178*   Iron  Studies: No results for input(s): "IRON ", "TIBC", "TRANSFERRIN", "FERRITIN" in the last 72 hours. @lablastinr3 @ Studies/Results: ECHOCARDIOGRAM COMPLETE Result Date: 08/29/2023    ECHOCARDIOGRAM REPORT   Patient Name:   JAQUANA DIMOND [facility] Date of Exam: 08/29/2023 Medical Rec #:  [id number]                    Height:       63.0 in Accession #:    [id number]                   Weight:       149.7 lb Date of Birth:  Oct 22, 1990                     BSA:          1.710 m Patient Age:    33 years                     BP:  147/85 mmHg Patient Gender: F                            HR:           88 bpm. Exam Location:  Inpatient Procedure: 2D Echo, 3D Echo, Cardiac Doppler, Color Doppler and Strain Analysis            (Both Spectral and Color Flow Doppler were utilized during            procedure). Indications:    Nonischemic cardiomyopathy  History:        Patient has prior history of Echocardiogram examinations, most                 recent 07/01/2023. Risk Factors:Dyslipidemia and Hypertension.                 ESRD.  Sonographer:    Juanita Shaw Referring Phys: [id number] Tylene Galla M GHIMIRE IMPRESSIONS  1. Left ventricular ejection fraction, by estimation, is 30 to 35%. Left ventricular ejection fraction by 3D volume is 34 %. Left ventricular ejection fraction by 2D MOD biplane is 29.6 %. The left ventricle has moderately decreased function. The left ventricle demonstrates global hypokinesis. Left ventricular diastolic parameters are consistent with Grade II diastolic dysfunction (pseudonormalization).  2. Right ventricular systolic function is normal. The right ventricular size is mildly enlarged. There is normal pulmonary artery systolic pressure. The estimated right ventricular systolic pressure is 34.4 mmHg.  3. Left atrial size was mildly dilated.  4. Right atrial size was mildly dilated.  5. The mitral valve is normal in  structure. No evidence of mitral valve regurgitation. No evidence of mitral stenosis.  6. The tricuspid valve is abnormal. Tricuspid valve regurgitation is severe.  7. The aortic valve is tricuspid. Aortic valve regurgitation is not visualized. No aortic stenosis is present.  8. The inferior vena cava is normal in size with <50% respiratory variability, suggesting right atrial pressure of 8 mmHg.  9. A small pericardial effusion is present. The pericardial effusion is circumferential. FINDINGS  Left Ventricle: Left ventricular ejection fraction, by estimation, is 30 to 35%. Left ventricular ejection fraction by 2D MOD biplane is 29.6 %. Left ventricular ejection fraction by 3D volume is 34 %. The left ventricle has moderately decreased function. The left ventricle demonstrates global hypokinesis. The left ventricular internal cavity size was normal in size. There is no left ventricular hypertrophy. Left ventricular diastolic parameters are consistent with Grade II diastolic dysfunction  (pseudonormalization). Right Ventricle: The right ventricular size is mildly enlarged. No increase in right ventricular wall thickness. Right ventricular systolic function is normal. There is normal pulmonary artery systolic pressure. The tricuspid regurgitant velocity is 2.57  m/s, and with an assumed right atrial pressure of 8 mmHg, the estimated right ventricular systolic pressure is 34.4 mmHg. Left Atrium: Left atrial size was mildly dilated. Right Atrium: Right atrial size was mildly dilated. Pericardium: A small pericardial effusion is present. The pericardial effusion is circumferential. Mitral Valve: The mitral valve is normal in structure. No evidence of mitral valve regurgitation. No evidence of mitral valve stenosis. MV peak gradient, 5.7 mmHg. The mean mitral valve gradient is 2.0 mmHg. Tricuspid Valve: The tricuspid valve is abnormal. Tricuspid valve regurgitation is severe. Aortic Valve: The aortic valve is tricuspid.  Aortic valve regurgitation is not visualized. No aortic stenosis is present. Aortic valve mean gradient measures 6.0 mmHg. Aortic valve peak gradient measures 9.0 mmHg. Aortic  valve area, by VTI measures 1.38 cm. Pulmonic Valve: The pulmonic valve was normal in structure. Pulmonic valve regurgitation is mild. Aorta: The aortic root is normal in size and structure. Venous: The inferior vena cava is normal in size with less than 50% respiratory variability, suggesting right atrial pressure of 8 mmHg. IAS/Shunts: No atrial level shunt detected by color flow Doppler. Additional Comments: 3D was performed not requiring image post processing on an independent workstation and was abnormal.  LEFT VENTRICLE PLAX 2D                        Biplane EF (MOD) LVIDd:         4.40 cm         LV Biplane EF:   Left LVIDs:         3.90 cm                          ventricular LV PW:         0.80 cm                          ejection LV IVS:        0.90 cm                          fraction by LVOT diam:     1.70 cm                          2D MOD LV SV:         42                               biplane is LV SV Index:   25                               29.6 %. LVOT Area:     2.27 cm                                Diastology                                LV e' medial:    6.08 cm/s LV Volumes (MOD)               LV E/e' medial:  18.6 LV vol d, MOD    157.0 ml      LV e' lateral:   9.00 cm/s A2C:                           LV E/e' lateral: 12.6 LV vol d, MOD    117.0 ml A4C: LV vol s, MOD    97.9 ml       3D Volume EF A2C:                           LV 3D EF:    Left LV vol s, MOD    93.4 ml  ventricul A4C:                                        ar LV SV MOD A2C:   59.1 ml                    ejection LV SV MOD A4C:   117.0 ml                   fraction LV SV MOD BP:    40.5 ml                    by 3D                                             volume is                                             34 %.                                  3D Volume EF:                                3D EF:        34 %                                LV EDV:       127 ml                                LV ESV:       83 ml                                LV SV:        44 ml RIGHT VENTRICLE             IVC RV Basal diam:  4.10 cm     IVC diam: 1.70 cm RV Mid diam:    3.70 cm RV S prime:     12.20 cm/s TAPSE (M-mode): 2.0 cm LEFT ATRIUM             Index        RIGHT ATRIUM           Index LA diam:        3.40 cm 1.99 cm/m   RA Area:     22.80 cm LA Vol (A2C):   65.8 ml 38.49 ml/m  RA Volume:   71.80 ml  42.00 ml/m LA Vol (A4C):   45.3 ml 26.50 ml/m LA Biplane Vol: 55.7 ml 32.58 ml/m  AORTIC VALVE                     PULMONIC VALVE AV Area (Vmax):    1.31 cm  PV Vmax:          1.46 m/s AV Area (Vmean):   1.19 cm      PV Peak grad:     8.6 mmHg AV Area (VTI):     1.38 cm      PR End Diast Vel: 8.29 msec AV Vmax:           150.00 cm/s AV Vmean:          114.000 cm/s AV VTI:            0.305 m AV Peak Grad:      9.0 mmHg AV Mean Grad:      6.0 mmHg LVOT Vmax:         86.90 cm/s LVOT Vmean:        59.700 cm/s LVOT VTI:          0.186 m LVOT/AV VTI ratio: 0.61  AORTA Ao Root diam: 2.70 cm Ao Asc diam:  2.60 cm MITRAL VALVE                TRICUSPID VALVE MV Area (PHT): 4.01 cm     TR Peak grad:   26.4 mmHg MV Area VTI:   2.22 cm     TR Vmax:        257.00 cm/s MV Peak grad:  5.7 mmHg MV Mean grad:  2.0 mmHg     SHUNTS MV Vmax:       1.19 m/s     Systemic VTI:  0.19 m MV Vmean:      70.4 cm/s    Systemic Diam: 1.70 cm MV Decel Time: 189 msec MV E velocity: 113.00 cm/s MV A velocity: 93.30 cm/s MV E/A ratio:  1.21 Dalton McleanMD Electronically signed by Archer Bear Signature Date/Time: 08/29/2023/11:07:43 AM    Final    IR Paracentesis Result Date: 08/28/2023 INDICATION: 33 year old female with type 1 diabetes, end-stage renal disease on dialysis, new ascites. Request for diagnostic and therapeutic paracentesis. EXAM: ULTRASOUND GUIDED DIAGNOSTIC  AND THERAPEUTIC PARACENTESIS MEDICATIONS: 10 mL 1% lidocaine  COMPLICATIONS: None immediate. PROCEDURE: Informed written consent was obtained from the patient after a discussion of the risks, benefits and alternatives to treatment. A timeout was performed prior to the initiation of the procedure. Initial ultrasound scanning demonstrates a large amount of ascites within the left lateral abdomen. The left ladder abdomen was prepped and draped in the usual sterile fashion. 1% lidocaine  was used for local anesthesia. Following this, a 19 gauge, 7-cm, Yueh catheter was introduced. An ultrasound image was saved for documentation purposes. The paracentesis was performed. The catheter was removed and a dressing was applied. The patient tolerated the procedure well without immediate post procedural complication. FINDINGS: A total of approximately 2.5 liters of amber fluid was removed. Samples were sent to the laboratory as requested by the clinical team. IMPRESSION: Successful ultrasound-guided paracentesis yielding 2.5 liters of peritoneal fluid. Performed by: Kacie Matthews PA-C Electronically Signed   By: Creasie Doctor M.D.   On: 08/28/2023 14:33   Medications:   carvedilol   25 mg Oral BID WC   Chlorhexidine  Gluconate Cloth  6 each Topical Q0600   Chlorhexidine  Gluconate Cloth  6 each Topical Q0600   DULoxetine   20 mg Oral Daily   glucagon  (human recombinant)  1 mg Intramuscular STAT   heparin  injection (subcutaneous)  5,000 Units Subcutaneous Q8H   insulin  aspart  0-6 Units Subcutaneous TID WC   insulin  glargine-yfgn  10 Units Subcutaneous QHS  losartan   50 mg Oral Daily   polyethylene glycol  17 g Oral Daily   rosuvastatin   10 mg Oral Daily    Dialysis Orders:  TTS [facility]  From 08/14/23 --> 3h   B350  57.5kg  R AVG  Heparin  1600 + 600u/hr   Assessment/Plan: HHS/ DKA: per primary team Volume overload: w/ pulm edema by CXR, improved s/p HD and paracentesis. Still  feels like she has some extra fluid on. Will plan for extra HD today for volume removal.  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 Resumed her phos binders  Ramona Burner, PA-C 08/30/2023, 10:59 AM  [facility] Pager: [phone]

## 2023-08-30 NOTE — TOC Progression Note (Addendum)
 Transition of Care [facility]) - Progression Note    Patient Details  Name: Ashley Freeman MRN: [id number] Date of Birth: 08-Oct-1990  Transition of Care [facility] CM/SW Contact  Eusebio High, RN Phone Number: 08/30/2023, 4:33 PM  Clinical Narrative:     RNCM spoke with patient and she has arranged for her transportation (thru Medicaid) to be here tomorrow  for DC to get her to HD on time and back home  No additional TOC needs  RNCM has notified provider and requested DC order in early to get her ready on time           Expected Discharge Plan and Services                                               Social Determinants of Health (SDOH) Interventions SDOH Screenings   Food Insecurity: No Food Insecurity (08/28/2023)  Housing: Low Risk  (08/28/2023)  Transportation Needs: No Transportation Needs (08/28/2023)  Utilities: Not At Risk (08/28/2023)  Depression (PHQ2-9): Medium Risk (03/02/2020)  Financial Resource Strain: High Risk (11/22/2022)   Received from [facility]  Physical Activity: Insufficiently Active (03/02/2022)   Received from [facility]  Social Connections: Moderately Integrated (08/28/2023)  Stress: No Stress Concern Present (03/08/2023)   Received from [facility]  Tobacco Use: Low Risk  (08/14/2023)    Readmission Risk Interventions    07/23/2023    4:25 PM 07/03/2023   10:14 AM 05/21/2023   11:04 AM  Readmission Risk Prevention Plan  Transportation Screening Complete Complete Complete  PCP or Specialist Appt within 3-5 Days   Complete  Social Work Consult for Recovery Care Planning/Counseling   Complete  Palliative Care Screening   Not Applicable  Medication Review Ocean[facility]) Referral to Pharmacy Complete Complete  PCP or Specialist appointment within 3-5 days of discharge Complete Complete   HRI or Home Care Consult Complete Complete   SW Recovery Care/Counseling Consult Complete Complete   Palliative Care  Screening Not Applicable Not Applicable   Skilled Nursing Facility Not Applicable Not Applicable

## 2023-08-31 ENCOUNTER — Other Ambulatory Visit (HOSPITAL_COMMUNITY): Payer: Self-pay

## 2023-08-31 DIAGNOSIS — E875 Hyperkalemia: Secondary | ICD-10-CM | POA: Diagnosis not present

## 2023-08-31 DIAGNOSIS — N186 End stage renal disease: Secondary | ICD-10-CM | POA: Diagnosis not present

## 2023-08-31 DIAGNOSIS — E1010 Type 1 diabetes mellitus with ketoacidosis without coma: Secondary | ICD-10-CM | POA: Diagnosis not present

## 2023-08-31 DIAGNOSIS — E1022 Type 1 diabetes mellitus with diabetic chronic kidney disease: Secondary | ICD-10-CM

## 2023-08-31 LAB — GLUCOSE, CAPILLARY
Glucose-Capillary: 106 mg/dL — ABNORMAL HIGH (ref 70–99)
Glucose-Capillary: 109 mg/dL — ABNORMAL HIGH (ref 70–99)
Glucose-Capillary: 122 mg/dL — ABNORMAL HIGH (ref 70–99)
Glucose-Capillary: 153 mg/dL — ABNORMAL HIGH (ref 70–99)
Glucose-Capillary: 158 mg/dL — ABNORMAL HIGH (ref 70–99)
Glucose-Capillary: 235 mg/dL — ABNORMAL HIGH (ref 70–99)
Glucose-Capillary: 405 mg/dL — ABNORMAL HIGH (ref 70–99)
Glucose-Capillary: 96 mg/dL (ref 70–99)

## 2023-08-31 LAB — BODY FLUID CULTURE W GRAM STAIN
Culture: NO GROWTH
Gram Stain: NONE SEEN

## 2023-08-31 MED ORDER — HYDROCODONE-ACETAMINOPHEN 5-325 MG PO TABS
1.0000 | ORAL_TABLET | Freq: Four times a day (QID) | ORAL | Status: DC | PRN
  Administered 2023-08-31 – 2023-09-02 (×3): 1 via ORAL
  Filled 2023-08-31 (×3): qty 1

## 2023-08-31 MED ORDER — CHLORHEXIDINE GLUCONATE CLOTH 2 % EX PADS
6.0000 | MEDICATED_PAD | Freq: Every day | CUTANEOUS | Status: DC
  Administered 2023-08-31: 6 via TOPICAL

## 2023-08-31 MED ORDER — HYDROCODONE-ACETAMINOPHEN 5-325 MG PO TABS
1.0000 | ORAL_TABLET | Freq: Four times a day (QID) | ORAL | 0 refills | Status: DC | PRN
  Filled 2023-08-31: qty 15, 4d supply, fill #0

## 2023-08-31 MED ORDER — LORAZEPAM 0.5 MG PO TABS
0.5000 mg | ORAL_TABLET | Freq: Once | ORAL | Status: AC
  Administered 2023-08-31: 0.5 mg via ORAL
  Filled 2023-08-31: qty 1

## 2023-08-31 MED ORDER — DICYCLOMINE HCL 20 MG PO TABS
20.0000 mg | ORAL_TABLET | Freq: Two times a day (BID) | ORAL | 0 refills | Status: DC
  Filled 2023-08-31: qty 20, 10d supply, fill #0

## 2023-08-31 MED ORDER — CYCLOBENZAPRINE HCL 5 MG PO TABS
5.0000 mg | ORAL_TABLET | Freq: Three times a day (TID) | ORAL | 0 refills | Status: DC | PRN
  Filled 2023-08-31: qty 30, 10d supply, fill #0

## 2023-08-31 MED ORDER — HYDROMORPHONE HCL 1 MG/ML IJ SOLN
0.5000 mg | INTRAMUSCULAR | Status: AC | PRN
  Administered 2023-08-31 – 2023-09-01 (×2): 1 mg via INTRAVENOUS
  Filled 2023-08-31 (×2): qty 1

## 2023-08-31 NOTE — Plan of Care (Signed)
  Problem: Nutritional: Goal: Maintenance of adequate nutrition will improve Outcome: Progressing   Problem: Skin Integrity: Goal: Risk for impaired skin integrity will decrease Outcome: Progressing   Problem: Education: Goal: Knowledge of General Education information will improve Description: Including pain rating scale, medication(s)/side effects and non-pharmacologic comfort measures Outcome: Progressing   Problem: Clinical Measurements: Goal: Respiratory complications will improve Outcome: Progressing   Problem: Activity: Goal: Risk for activity intolerance will decrease Outcome: Progressing   Problem: Nutrition: Goal: Adequate nutrition will be maintained Outcome: Progressing

## 2023-08-31 NOTE — Progress Notes (Signed)
 [facility] Progress Note   Subjective:   Only tolerated 1.5hr HD yesterday due to stomach pain/diarrhea. Unable to arrange transportation to outpatient dialysis today, will need HD prior to discharge. Denies SOB, CP.   Objective Vitals:   08/30/23 1624 08/30/23 1638 08/30/23 2030 08/31/23 0455  BP: (!) 156/89  (!) 148/91 (!) 140/97  Pulse: 84  84 86  Resp: 19  (!) 21 17  Temp: 98.3 F (36.8 C)  97.9 F (36.6 C) 98.3 F (36.8 C)  TempSrc: Oral  Oral Oral  SpO2:    97%  Weight:  66.9 kg    Height:       Physical Exam General: sleeping female, awakens to voice, NAD  Heart: RRR, no murmurs, rubs or gallops Lungs: CTA bilaterally, respirations unlabored Abdomen: + mild distention, +BS Extremities: no edema b/l lower extremities Dialysis Access:  AVG  Additional Objective Labs: Basic Metabolic Panel: Recent Labs  Lab 08/27/23 0503 08/27/23 1205 08/29/23 1150  NA 139 136 140  K 4.3 4.6 4.3  CL 103 102 101  CO2 24 20* 21*  GLUCOSE 149* 158* 67*  BUN 46* 48* 51*  CREATININE 6.10* 6.32* 6.59*  CALCIUM  7.7* 7.6* 8.1*  PHOS  --   --  4.4   Liver Function Tests: Recent Labs  Lab 08/26/23 2228 08/28/23 1035 08/29/23 1150  AST 46*  --   --   ALT 49*  --   --   ALKPHOS 362*  --   --   BILITOT 1.2  --   --   PROT 6.2*  --   --   ALBUMIN 3.2* 3.3* 3.4*   Recent Labs  Lab 08/26/23 2228  LIPASE 27   CBC: Recent Labs  Lab 08/26/23 2228 08/27/23 0526  WBC 6.8 9.1  HGB 12.1 12.2  HCT 41.7 41.5  MCV 94.6 92.4  PLT 159 189   Blood Culture    Component Value Date/Time   SDES PERITONEAL 08/28/2023 1230   SPECREQUEST NONE 08/28/2023 1230   CULT  08/28/2023 1230    NO GROWTH 3 DAYS Performed at [facility], [address], [place], [place] [address]    REPTSTATUS 08/31/2023 FINAL 08/28/2023 1230    Cardiac Enzymes: No results for input(s): "CKTOTAL", "CKMB", "CKMBINDEX", "TROPONINI" in the last 168 hours. CBG: Recent Labs  Lab  08/31/23 0034 08/31/23 0234 08/31/23 0445 08/31/23 0656 08/31/23 0758  GLUCAP 153* 122* 96 109* 106*   Iron  Studies: No results for input(s): "IRON ", "TIBC", "TRANSFERRIN", "FERRITIN" in the last 72 hours. @lablastinr3 @ Studies/Results: MR LUMBAR SPINE WO CONTRAST Result Date: 08/30/2023 CLINICAL DATA:  Low back pain, prior surgery, new symptoms EXAM: MRI LUMBAR SPINE WITHOUT CONTRAST TECHNIQUE: Multiplanar, multisequence MR imaging of the lumbar spine was performed. No intravenous contrast was administered. COMPARISON:  None Available. FINDINGS: Motion limited study.  Within this limitation: Segmentation: Standard segmentation is assumed. The inferior-most fully formed intervertebral disc labeled L5-S1. Alignment:  No substantial sagittal subluxation. Vertebrae: No fracture, evidence of discitis, or suspicious bone lesion. Conus medullaris and cauda equina: Conus extends to the T12-L1 level. Conus and cauda equina appear normal. Paraspinal and other soft tissues: Please see recent CT of the abdomen/pelvis for intra-abdominal intrapelvic valuation. Disc levels: No significant disc protrusion, foraminal stenosis, or canal stenosis. IMPRESSION: No evidence of acute abnormality or significant stenosis. Electronically Signed   By: Stevenson Elbe M.D.   On: 08/30/2023 23:08   ECHOCARDIOGRAM COMPLETE Result Date: 08/29/2023    ECHOCARDIOGRAM REPORT  Patient Name:   [facility] Date of Exam: 08/29/2023 Medical Rec #:  [id number]                    Height:       63.0 in Accession #:    [id number]                   Weight:       149.7 lb Date of Birth:  07-19-1990                     BSA:          1.710 m Patient Age:    33 years                     BP:           147/85 mmHg Patient Gender: F                            HR:           88 bpm. Exam Location:  Inpatient Procedure: 2D Echo, 3D Echo, Cardiac Doppler, Color Doppler and Strain Analysis            (Both Spectral and Color Flow Doppler  were utilized during            procedure). Indications:    Nonischemic cardiomyopathy  History:        Patient has prior history of Echocardiogram examinations, most                 recent 07/01/2023. Risk Factors:Dyslipidemia and Hypertension.                 ESRD.  Sonographer:    Juanita Shaw Referring Phys: [id number] Tylene Galla M GHIMIRE IMPRESSIONS  1. Left ventricular ejection fraction, by estimation, is 30 to 35%. Left ventricular ejection fraction by 3D volume is 34 %. Left ventricular ejection fraction by 2D MOD biplane is 29.6 %. The left ventricle has moderately decreased function. The left ventricle demonstrates global hypokinesis. Left ventricular diastolic parameters are consistent with Grade II diastolic dysfunction (pseudonormalization).  2. Right ventricular systolic function is normal. The right ventricular size is mildly enlarged. There is normal pulmonary artery systolic pressure. The estimated right ventricular systolic pressure is 34.4 mmHg.  3. Left atrial size was mildly dilated.  4. Right atrial size was mildly dilated.  5. The mitral valve is normal in structure. No evidence of mitral valve regurgitation. No evidence of mitral stenosis.  6. The tricuspid valve is abnormal. Tricuspid valve regurgitation is severe.  7. The aortic valve is tricuspid. Aortic valve regurgitation is not visualized. No aortic stenosis is present.  8. The inferior vena cava is normal in size with <50% respiratory variability, suggesting right atrial pressure of 8 mmHg.  9. A small pericardial effusion is present. The pericardial effusion is circumferential. FINDINGS  Left Ventricle: Left ventricular ejection fraction, by estimation, is 30 to 35%. Left ventricular ejection fraction by 2D MOD biplane is 29.6 %. Left ventricular ejection fraction by 3D volume is 34 %. The left ventricle has moderately decreased function. The left ventricle demonstrates global hypokinesis. The left ventricular internal cavity size was normal in  size. There is no left ventricular hypertrophy. Left ventricular diastolic parameters are consistent with Grade II diastolic dysfunction  (pseudonormalization). Right Ventricle: The right ventricular size is mildly enlarged. No increase  in right ventricular wall thickness. Right ventricular systolic function is normal. There is normal pulmonary artery systolic pressure. The tricuspid regurgitant velocity is 2.57  m/s, and with an assumed right atrial pressure of 8 mmHg, the estimated right ventricular systolic pressure is 34.4 mmHg. Left Atrium: Left atrial size was mildly dilated. Right Atrium: Right atrial size was mildly dilated. Pericardium: A small pericardial effusion is present. The pericardial effusion is circumferential. Mitral Valve: The mitral valve is normal in structure. No evidence of mitral valve regurgitation. No evidence of mitral valve stenosis. MV peak gradient, 5.7 mmHg. The mean mitral valve gradient is 2.0 mmHg. Tricuspid Valve: The tricuspid valve is abnormal. Tricuspid valve regurgitation is severe. Aortic Valve: The aortic valve is tricuspid. Aortic valve regurgitation is not visualized. No aortic stenosis is present. Aortic valve mean gradient measures 6.0 mmHg. Aortic valve peak gradient measures 9.0 mmHg. Aortic valve area, by VTI measures 1.38 cm. Pulmonic Valve: The pulmonic valve was normal in structure. Pulmonic valve regurgitation is mild. Aorta: The aortic root is normal in size and structure. Venous: The inferior vena cava is normal in size with less than 50% respiratory variability, suggesting right atrial pressure of 8 mmHg. IAS/Shunts: No atrial level shunt detected by color flow Doppler. Additional Comments: 3D was performed not requiring image post processing on an independent workstation and was abnormal.  LEFT VENTRICLE PLAX 2D                        Biplane EF (MOD) LVIDd:         4.40 cm         LV Biplane EF:   Left LVIDs:         3.90 cm                           ventricular LV PW:         0.80 cm                          ejection LV IVS:        0.90 cm                          fraction by LVOT diam:     1.70 cm                          2D MOD LV SV:         42                               biplane is LV SV Index:   25                               29.6 %. LVOT Area:     2.27 cm                                Diastology                                LV e' medial:    6.08 cm/s LV Volumes (MOD)  LV E/e' medial:  18.6 LV vol d, MOD    157.0 ml      LV e' lateral:   9.00 cm/s A2C:                           LV E/e' lateral: 12.6 LV vol d, MOD    117.0 ml A4C: LV vol s, MOD    97.9 ml       3D Volume EF A2C:                           LV 3D EF:    Left LV vol s, MOD    93.4 ml                    ventricul A4C:                                        ar LV SV MOD A2C:   59.1 ml                    ejection LV SV MOD A4C:   117.0 ml                   fraction LV SV MOD BP:    40.5 ml                    by 3D                                             volume is                                             34 %.                                 3D Volume EF:                                3D EF:        34 %                                LV EDV:       127 ml                                LV ESV:       83 ml                                LV SV:        44 ml RIGHT VENTRICLE             IVC RV Basal diam:  4.10 cm     IVC diam: 1.70 cm RV Mid diam:  3.70 cm RV S prime:     12.20 cm/s TAPSE (M-mode): 2.0 cm LEFT ATRIUM             Index        RIGHT ATRIUM           Index LA diam:        3.40 cm 1.99 cm/m   RA Area:     22.80 cm LA Vol (A2C):   65.8 ml 38.49 ml/m  RA Volume:   71.80 ml  42.00 ml/m LA Vol (A4C):   45.3 ml 26.50 ml/m LA Biplane Vol: 55.7 ml 32.58 ml/m  AORTIC VALVE                     PULMONIC VALVE AV Area (Vmax):    1.31 cm      PV Vmax:          1.46 m/s AV Area (Vmean):   1.19 cm      PV Peak grad:     8.6 mmHg AV Area (VTI):     1.38 cm      PR  End Diast Vel: 8.29 msec AV Vmax:           150.00 cm/s AV Vmean:          114.000 cm/s AV VTI:            0.305 m AV Peak Grad:      9.0 mmHg AV Mean Grad:      6.0 mmHg LVOT Vmax:         86.90 cm/s LVOT Vmean:        59.700 cm/s LVOT VTI:          0.186 m LVOT/AV VTI ratio: 0.61  AORTA Ao Root diam: 2.70 cm Ao Asc diam:  2.60 cm MITRAL VALVE                TRICUSPID VALVE MV Area (PHT): 4.01 cm     TR Peak grad:   26.4 mmHg MV Area VTI:   2.22 cm     TR Vmax:        257.00 cm/s MV Peak grad:  5.7 mmHg MV Mean grad:  2.0 mmHg     SHUNTS MV Vmax:       1.19 m/s     Systemic VTI:  0.19 m MV Vmean:      70.4 cm/s    Systemic Diam: 1.70 cm MV Decel Time: 189 msec MV E velocity: 113.00 cm/s MV A velocity: 93.30 cm/s MV E/A ratio:  1.21 Dalton McleanMD Electronically signed by Archer Bear Signature Date/Time: 08/29/2023/11:07:43 AM    Final    Medications:   carvedilol   25 mg Oral BID WC   Chlorhexidine  Gluconate Cloth  6 each Topical Q0600   DULoxetine   20 mg Oral Daily   heparin  injection (subcutaneous)  5,000 Units Subcutaneous Q8H   insulin  aspart  0-6 Units Subcutaneous TID WC   insulin  glargine-yfgn  10 Units Subcutaneous QHS   losartan   50 mg Oral Daily   polyethylene glycol  17 g Oral Daily   rosuvastatin   10 mg Oral Daily   sevelamer  carbonate  3,200 mg Oral TID WC    Dialysis Orders:  TTS [facility] [address] [facility]  From 08/14/23 --> 3h   B350  57.5kg  R AVG  Heparin  1600 + 600u/hr  Assessment/Plan: HHS/ DKA: per primary team Volume overload: w/ pulm edema by CXR,  improved s/p HD and paracentesis. Attempted extra HD but only tolerated 1.5 hours due to abdominal pain. Planned to HD again today, UF as tolerated. 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 Resumed her phos binders  Ramona Burner,  PA-C 08/31/2023, 10:22 AM  [facility] Pager: [phone]

## 2023-08-31 NOTE — Plan of Care (Signed)
   Problem: Nutrition: Goal: Adequate nutrition will be maintained Outcome: Progressing   Problem: Coping: Goal: Level of anxiety will decrease Outcome: Progressing

## 2023-08-31 NOTE — Discharge Summary (Signed)
 PATIENT DETAILS Name: Ashley Freeman Age: 33 y.o. Sex: female Date of Birth: 1991/04/22 MRN: [id number]. Admitting Physician: Ashley Casey, MD VZD:GLOVFI, Ashley Pap, MD  Admit Date: 08/26/2023 Discharge date: 08/31/2023  Recommendations for Outpatient Follow-up:  Follow up with PCP in 1-2 weeks Please obtain CMP/CBC in one week  Admitted From:  Home  Disposition: Home   Discharge Condition: good  CODE STATUS:   Code Status: Full Code   Diet recommendation:  Diet Order             Diet - low sodium heart healthy           Diet Carb Modified           Diet Carb Modified Fluid consistency: Thin; Room service appropriate? Yes  Diet effective now                    Brief Summary: Patient is a 33 y.o.  female with history of ESRD on HD TTS, chronic HFrEF, DM-1, s/p recent cholecystectomy on 3/4-who presented with abdominal pain/distention-found to have DKA and subsequently admitted to the hospitalist service.   Significant events: 4/28>> admit to [facility]   Significant studies: 3/03>> echo: EF 25-30%, grade 2 diastolic dysfunction 4/29>> CT abdomen/pelvis: Large volume ascites, small bilateral pleural effusions, diffuse body wall edema. 5/1>> echo: EF 30-35%, grade 2 diastolic dysfunction. 5/2>> MRI LS spine: No evidence of acute abnormality or significant stenosis.   Significant microbiology data: 4/30>> peritoneal fluid culture: Negative   Procedures: 4/30>> paracentesis (WBC 306-with 53% lymphocytes, 46% monocytes, 1% neutrophils, albumin 2.1) (serum albumin 3.3)  (SAAG 1.2)   Consults: Nephrology  Brief Hospital Course: DKA Resolved with IV insulin  Has been transition to SQ insulin -see below   Abdominal pain-worsening ascites Significant improvement in abdominal pain-some mild abdominal pain persists. S/p paracentesis on 4/30-predominantly lymphocytic-cytology negative-peritoneal fluid cultures negative-SAAG of 1.2-suspect likely  reflective of portal hypertension and setting of CHF/ESRD. Continue to remove volume with HD. Since continues to have pain-will provide a few days supply of oral narcotics for patient at home request.   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   PCP to continue to optimize glycemic regimen on discharge  Low back pain Per patient-her outpatient physician has referred her to a spine specialist. MRI imaging negative for any significant abnormalities As needed Flexeril .   ESRD on HD TTS Nephrology followed closely-continue outpatient of follow-up with nephrology/dialysis.   Acute on Chronic HFrEF (EF 25-30% by echo on 3/3) Volume overloaded on exam-with leg edema/ascites/pleural effusion on imaging studies Continue volume removal with HD Continue Coreg /losartan  Apparently she is not taking Entresto-claims she had unspecified side effect.   Prolonged QTc Mildly long QTc-less than 500 ms on repeat twelve-lead EKG Was monitored closely on telemetry..   Mood disorder Discussed with patient-she is no longer on Prozac  Cymbalta /duloxetine /methylphenidate has been resumed.   Discharge Diagnoses:  Active Problems:   Hyperglycemic crisis due to Type 1 diabetes mellitus (HCC)   Chronic combined systolic and diastolic heart failure (HCC)   Hyperkalemia   ESRD on hemodialysis (HCC)   DKA (diabetic ketoacidosis) (HCC)   Generalized abdominal pain   Discharge Instructions:  Activity:  As tolerated   Discharge Instructions     Diet - low sodium heart healthy  Complete by: As directed    Diet Carb Modified   Complete by: As directed    Discharge instructions   Complete by: As directed    Follow with Primary MD  Ashley Freeman, Ashley  Pap, MD in 1-2 weeks  Follow-up with your primary nephrologist as previously scheduled  Follow-up with your hemodialysis center at your prior schedule  Please get a complete blood count and chemistry panel checked by your Primary MD at your next visit, and again as instructed by your Primary MD.  Get Medicines reviewed and adjusted: Please take all your medications with you for your next visit with your Primary MD  Laboratory/radiological data: Please request your Primary MD to go over all hospital tests and procedure/radiological results at the follow up, please ask your Primary MD to get all Hospital records sent to his/her office.  In some cases, they will be blood work, cultures and biopsy results pending at the time of your discharge. Please request that your primary care M.D. follows up on these results.  Also Note the following: If you experience worsening of your admission symptoms, develop shortness of breath, life threatening emergency, suicidal or homicidal thoughts you must seek medical attention immediately by calling 911 or calling your MD immediately  if symptoms less severe.  You must read complete instructions/literature along with all the possible adverse reactions/side effects for all the Medicines you take and that have been prescribed to you. Take any new Medicines after you have completely understood and accpet all the possible adverse reactions/side effects.   Do not drive when taking Pain medications or sleeping medications (Benzodaizepines)  Do not take more than prescribed Pain, Sleep and Anxiety Medications. It is not advisable to combine anxiety,sleep and pain medications without talking with your primary care practitioner  Special Instructions: If you have smoked or chewed Tobacco  in the last 2 yrs please stop smoking, stop any regular Alcohol  and or any Recreational drug use.  Wear Seat belts while driving.  Please note: You were cared for by a  hospitalist during your hospital stay. Once you are discharged, your primary care physician will handle any further medical issues. Please note that NO REFILLS for any discharge medications will be authorized once you are discharged, as it is imperative that you return to your primary care physician (or establish a relationship with a primary care physician if you do not have one) for your post hospital discharge needs so that they can reassess your need for medications and monitor your lab values.   Increase activity slowly   Complete by: As directed       Allergies as of 08/31/2023       Reactions   Cephalexin  Anaphylaxis   Has gotten ceftriaxone  in the past    Morphine  Itching   Penicillins Hives, Rash   Fish Allergy    Benadryl  [diphenhydramine ] Itching   Dilaudid  [hydromorphone ] Itching   Doxycycline  Itching   Methotrexate Derivatives Rash   Oxycodone  Itching        Medication List     TAKE these medications    acetaminophen  500 MG tablet Commonly known as: TYLENOL  Take 1,000 mg by mouth as needed for mild pain (pain score 1-3) or moderate pain (pain score 4-6).   albuterol  108 (90 Base) MCG/ACT inhaler Commonly known as: VENTOLIN  HFA Inhale 2 puffs into the lungs every 4 (four) hours as needed for wheezing or shortness of breath.   bumetanide 2 MG tablet Commonly known as: BUMEX Take  2 mg by mouth daily.   calcitRIOL  0.25 MCG capsule Commonly known as: ROCALTROL  Take 5 capsules (1.25 mcg total) by mouth every Tuesday, Thursday, and Saturday at 6 PM.   carvedilol  25 MG tablet Commonly known as: COREG  Take 25 mg by mouth 2 (two) times daily with a meal.   cyclobenzaprine  5 MG tablet Commonly known as: FLEXERIL  Take 1 tablet (5 mg total) by mouth 3 (three) times daily as needed for muscle spasms.   Dexcom G7 Sensor Misc Change sensors every 10 days   dicyclomine  20 MG tablet Commonly known as: BENTYL  Take 1 tablet (20 mg total) by mouth 2 (two) times daily  for 10 days.   DULoxetine  20 MG capsule Commonly known as: CYMBALTA  Take 20 mg by mouth daily.   famotidine  20 MG tablet Commonly known as: PEPCID  Take 20 mg by mouth 2 (two) times daily.   HYDROcodone -acetaminophen  5-325 MG tablet Commonly known as: NORCO/VICODIN Take 1 tablet by mouth every 6 (six) hours as needed for moderate pain (pain score 4-6).   hydrocortisone  2.5 % cream Apply 1 Application topically daily as needed.   hydrOXYzine  25 MG tablet Commonly known as: ATARAX  Take 25 mg by mouth every 6 (six) hours as needed for anxiety or itching.   insulin  aspart 100 UNIT/ML injection Commonly known as: novoLOG  Inject 5 Units into the skin 3 (three) times daily as needed for high blood sugar. What changed: Another medication with the same name was removed. Continue taking this medication, and follow the directions you see here.   lamoTRIgine  200 MG tablet Commonly known as: LAMICTAL  Take 200 mg by mouth daily.   Lantus  SoloStar 100 UNIT/ML Solostar Pen Generic drug: insulin  glargine Inject 10 Units into the skin at bedtime.   LORazepam  1 MG tablet Commonly known as: ATIVAN  Take 1 mg by mouth daily as needed.   losartan  100 MG tablet Commonly known as: COZAAR  Take 150 mg by mouth daily.   methylphenidate 18 MG CR tablet Commonly known as: CONCERTA Take 18 mg by mouth every morning.   ondansetron  4 MG disintegrating tablet Commonly known as: ZOFRAN -ODT Take 1 tablet (4 mg total) by mouth every 8 (eight) hours as needed for nausea or vomiting.   polyethylene glycol 17 g packet Commonly known as: MIRALAX  / GLYCOLAX  Take 17 g by mouth daily as needed for mild constipation.   rosuvastatin  40 MG tablet Commonly known as: CRESTOR  Take 40 mg by mouth daily.   sevelamer  carbonate 800 MG tablet Commonly known as: RENVELA  Take 2,400 mg by mouth 3 (three) times daily with meals.   sodium bicarbonate  650 MG tablet Take 1,300 mg by mouth 2 (two) times daily.    SUMAtriptan  50 MG tablet Commonly known as: IMITREX  Take 50 mg by mouth every 2 (two) hours as needed for migraine or headache.        Follow-up Information     Ashley Freeman, Ashley Pap, MD. Schedule an appointment as soon as possible for a visit in 1 week(s).   Specialty: Family Medicine Contact information: [address] [place] [address] [phone]                Allergies  Allergen Reactions   Cephalexin  Anaphylaxis    Has gotten ceftriaxone  in the past    Morphine  Itching   Penicillins Hives and Rash   Fish Allergy    Benadryl  [Diphenhydramine ] Itching   Dilaudid  [Hydromorphone ] Itching   Doxycycline  Itching   Methotrexate Derivatives Rash  Oxycodone  Itching     Other Procedures/Studies: MR LUMBAR SPINE WO CONTRAST Result Date: 08/30/2023 CLINICAL DATA:  Low back pain, prior surgery, new symptoms EXAM: MRI LUMBAR SPINE WITHOUT CONTRAST TECHNIQUE: Multiplanar, multisequence MR imaging of the lumbar spine was performed. No intravenous contrast was administered. COMPARISON:  None Available. FINDINGS: Motion limited study.  Within this limitation: Segmentation: Standard segmentation is assumed. The inferior-most fully formed intervertebral disc labeled L5-S1. Alignment:  No substantial sagittal subluxation. Vertebrae: No fracture, evidence of discitis, or suspicious bone lesion. Conus medullaris and cauda equina: Conus extends to the T12-L1 level. Conus and cauda equina appear normal. Paraspinal and other soft tissues: Please see recent CT of the abdomen/pelvis for intra-abdominal intrapelvic valuation. Disc levels: No significant disc protrusion, foraminal stenosis, or canal stenosis. IMPRESSION: No evidence of acute abnormality or significant stenosis. Electronically Signed   By: Stevenson Elbe M.D.   On: 08/30/2023 23:08   ECHOCARDIOGRAM COMPLETE Result Date: 08/29/2023    ECHOCARDIOGRAM REPORT   Patient Name:   Ashley Freeman [facility] Date of Exam: 08/29/2023  Medical Rec #:  [id number]                    Height:       63.0 in Accession #:    [id number]                   Weight:       149.7 lb Date of Birth:  January 02, 1991                     BSA:          1.710 m Patient Age:    33 years                     BP:           147/85 mmHg Patient Gender: F                            HR:           88 bpm. Exam Location:  Inpatient Procedure: 2D Echo, 3D Echo, Cardiac Doppler, Color Doppler and Strain Analysis            (Both Spectral and Color Flow Doppler were utilized during            procedure). Indications:    Nonischemic cardiomyopathy  History:        Patient has prior history of Echocardiogram examinations, most                 recent 07/01/2023. Risk Factors:Dyslipidemia and Hypertension.                 ESRD.  Sonographer:    Juanita Shaw Referring Phys: [id number] Tylene Galla M Adelyne Marchese IMPRESSIONS  1. Left ventricular ejection fraction, by estimation, is 30 to 35%. Left ventricular ejection fraction by 3D volume is 34 %. Left ventricular ejection fraction by 2D MOD biplane is 29.6 %. The left ventricle has moderately decreased function. The left ventricle demonstrates global hypokinesis. Left ventricular diastolic parameters are consistent with Grade II diastolic dysfunction (pseudonormalization).  2. Right ventricular systolic function is normal. The right ventricular size is mildly enlarged. There is normal pulmonary artery systolic pressure. The estimated right ventricular systolic pressure is 34.4 mmHg.  3. Left atrial size was mildly dilated.  4. Right atrial size was mildly dilated.  5. The  mitral valve is normal in structure. No evidence of mitral valve regurgitation. No evidence of mitral stenosis.  6. The tricuspid valve is abnormal. Tricuspid valve regurgitation is severe.  7. The aortic valve is tricuspid. Aortic valve regurgitation is not visualized. No aortic stenosis is present.  8. The inferior vena cava is normal in size with <50% respiratory variability, suggesting  right atrial pressure of 8 mmHg.  9. A small pericardial effusion is present. The pericardial effusion is circumferential. FINDINGS  Left Ventricle: Left ventricular ejection fraction, by estimation, is 30 to 35%. Left ventricular ejection fraction by 2D MOD biplane is 29.6 %. Left ventricular ejection fraction by 3D volume is 34 %. The left ventricle has moderately decreased function. The left ventricle demonstrates global hypokinesis. The left ventricular internal cavity size was normal in size. There is no left ventricular hypertrophy. Left ventricular diastolic parameters are consistent with Grade II diastolic dysfunction  (pseudonormalization). Right Ventricle: The right ventricular size is mildly enlarged. No increase in right ventricular wall thickness. Right ventricular systolic function is normal. There is normal pulmonary artery systolic pressure. The tricuspid regurgitant velocity is 2.57  m/s, and with an assumed right atrial pressure of 8 mmHg, the estimated right ventricular systolic pressure is 34.4 mmHg. Left Atrium: Left atrial size was mildly dilated. Right Atrium: Right atrial size was mildly dilated. Pericardium: A small pericardial effusion is present. The pericardial effusion is circumferential. Mitral Valve: The mitral valve is normal in structure. No evidence of mitral valve regurgitation. No evidence of mitral valve stenosis. MV peak gradient, 5.7 mmHg. The mean mitral valve gradient is 2.0 mmHg. Tricuspid Valve: The tricuspid valve is abnormal. Tricuspid valve regurgitation is severe. Aortic Valve: The aortic valve is tricuspid. Aortic valve regurgitation is not visualized. No aortic stenosis is present. Aortic valve mean gradient measures 6.0 mmHg. Aortic valve peak gradient measures 9.0 mmHg. Aortic valve area, by VTI measures 1.38 cm. Pulmonic Valve: The pulmonic valve was normal in structure. Pulmonic valve regurgitation is mild. Aorta: The aortic root is normal in size and structure.  Venous: The inferior vena cava is normal in size with less than 50% respiratory variability, suggesting right atrial pressure of 8 mmHg. IAS/Shunts: No atrial level shunt detected by color flow Doppler. Additional Comments: 3D was performed not requiring image post processing on an independent workstation and was abnormal.  LEFT VENTRICLE PLAX 2D                        Biplane EF (MOD) LVIDd:         4.40 cm         LV Biplane EF:   Left LVIDs:         3.90 cm                          ventricular LV PW:         0.80 cm                          ejection LV IVS:        0.90 cm                          fraction by LVOT diam:     1.70 cm  2D MOD LV SV:         42                               biplane is LV SV Index:   25                               29.6 %. LVOT Area:     2.27 cm                                Diastology                                LV e' medial:    6.08 cm/s LV Volumes (MOD)               LV E/e' medial:  18.6 LV vol d, MOD    157.0 ml      LV e' lateral:   9.00 cm/s A2C:                           LV E/e' lateral: 12.6 LV vol d, MOD    117.0 ml A4C: LV vol s, MOD    97.9 ml       3D Volume EF A2C:                           LV 3D EF:    Left LV vol s, MOD    93.4 ml                    ventricul A4C:                                        ar LV SV MOD A2C:   59.1 ml                    ejection LV SV MOD A4C:   117.0 ml                   fraction LV SV MOD BP:    40.5 ml                    by 3D                                             volume is                                             34 %.                                 3D Volume EF:  3D EF:        34 %                                LV EDV:       127 ml                                LV ESV:       83 ml                                LV SV:        44 ml RIGHT VENTRICLE             IVC RV Basal diam:  4.10 cm     IVC diam: 1.70 cm RV Mid diam:    3.70 cm RV S prime:     12.20 cm/s TAPSE  (M-mode): 2.0 cm LEFT ATRIUM             Index        RIGHT ATRIUM           Index LA diam:        3.40 cm 1.99 cm/m   RA Area:     22.80 cm LA Vol (A2C):   65.8 ml 38.49 ml/m  RA Volume:   71.80 ml  42.00 ml/m LA Vol (A4C):   45.3 ml 26.50 ml/m LA Biplane Vol: 55.7 ml 32.58 ml/m  AORTIC VALVE                     PULMONIC VALVE AV Area (Vmax):    1.31 cm      PV Vmax:          1.46 m/s AV Area (Vmean):   1.19 cm      PV Peak grad:     8.6 mmHg AV Area (VTI):     1.38 cm      PR End Diast Vel: 8.29 msec AV Vmax:           150.00 cm/s AV Vmean:          114.000 cm/s AV VTI:            0.305 m AV Peak Grad:      9.0 mmHg AV Mean Grad:      6.0 mmHg LVOT Vmax:         86.90 cm/s LVOT Vmean:        59.700 cm/s LVOT VTI:          0.186 m LVOT/AV VTI ratio: 0.61  AORTA Ao Root diam: 2.70 cm Ao Asc diam:  2.60 cm MITRAL VALVE                TRICUSPID VALVE MV Area (PHT): 4.01 cm     TR Peak grad:   26.4 mmHg MV Area VTI:   2.22 cm     TR Vmax:        257.00 cm/s MV Peak grad:  5.7 mmHg MV Mean grad:  2.0 mmHg     SHUNTS MV Vmax:       1.19 m/s     Systemic VTI:  0.19 m MV Vmean:      70.4 cm/s    Systemic Diam: 1.70 cm MV Decel Time: 189 msec  MV E velocity: 113.00 cm/s MV A velocity: 93.30 cm/s MV E/A ratio:  1.21 Dalton McleanMD Electronically signed by Archer Bear Signature Date/Time: 08/29/2023/11:07:43 AM    Final    IR Paracentesis Result Date: 08/28/2023 INDICATION: 33 year old female with type 1 diabetes, end-stage renal disease on dialysis, new ascites. Request for diagnostic and therapeutic paracentesis. EXAM: ULTRASOUND GUIDED DIAGNOSTIC AND THERAPEUTIC PARACENTESIS MEDICATIONS: 10 mL 1% lidocaine  COMPLICATIONS: None immediate. PROCEDURE: Informed written consent was obtained from the patient after a discussion of the risks, benefits and alternatives to treatment. A timeout was performed prior to the initiation of the procedure. Initial ultrasound scanning demonstrates a large amount of ascites  within the left lateral abdomen. The left ladder abdomen was prepped and draped in the usual sterile fashion. 1% lidocaine  was used for local anesthesia. Following this, a 19 gauge, 7-cm, Yueh catheter was introduced. An ultrasound image was saved for documentation purposes. The paracentesis was performed. The catheter was removed and a dressing was applied. The patient tolerated the procedure well without immediate post procedural complication. FINDINGS: A total of approximately 2.5 liters of amber fluid was removed. Samples were sent to the laboratory as requested by the clinical team. IMPRESSION: Successful ultrasound-guided paracentesis yielding 2.5 liters of peritoneal fluid. Performed by: Kacie Matthews PA-C Electronically Signed   By: Creasie Doctor M.D.   On: 08/28/2023 14:33   CT ABDOMEN PELVIS WO CONTRAST Result Date: 08/27/2023 CLINICAL DATA:  Abdominal pain with nausea and vomiting. Recent cholecystectomy on 07/02/2023. EXAM: CT ABDOMEN AND PELVIS WITHOUT CONTRAST TECHNIQUE: Multidetector CT imaging of the abdomen and pelvis was performed following the standard protocol without IV contrast. RADIATION DOSE REDUCTION: This exam was performed according to the departmental dose-optimization program which includes automated exposure control, adjustment of the mA and/or kV according to patient size and/or use of iterative reconstruction technique. COMPARISON:  07/19/2023 FINDINGS: Lower chest: Interlobular septal thickening is associated with bilateral small pleural effusions, right greater than left. Hepatobiliary: No suspicious focal abnormality in the liver on this study without intravenous contrast. Hepatomegaly again noted. Cholecystectomy. Stable crescent of material in the gallbladder fossa compared to 07/19/2023, likely postsurgical. No intrahepatic or extrahepatic biliary dilation. Pancreas: No focal mass lesion. No dilatation of the main duct. No intraparenchymal cyst. No peripancreatic edema.  Spleen: No splenomegaly. No suspicious focal mass lesion. Adrenals/Urinary Tract: No adrenal nodule or mass. Kidneys unremarkable. No evidence for hydroureter. The urinary bladder appears normal for the degree of distention. Stomach/Bowel: Stomach is unremarkable. No gastric wall thickening. No evidence of outlet obstruction. Duodenum is normally positioned as is the ligament of Treitz. No small bowel wall thickening. No small bowel dilatation. The terminal ileum is normal. The appendix is normal. No gross colonic mass. No colonic wall thickening. Vascular/Lymphatic: No abdominal aortic aneurysm. Upper normal retroperitoneal lymph nodes evident although no abdominal lymphadenopathy on this noncontrast study.No pelvic sidewall lymphadenopathy. Upper normal lymph nodes in the groin regions bilaterally are similar to prior and likely reactive. Reproductive: Unremarkable. Other: Moderate to large volume ascites is substantially progressive since 07/19/2023. Musculoskeletal: Diffuse body wall edema. No worrisome lytic or sclerotic osseous abnormality. IMPRESSION: 1. Moderate to large volume ascites is substantially progressive since 07/19/2023. 2. Interlobular septal thickening is associated with bilateral small pleural effusions, right greater than left. Imaging features compatible with pulmonary edema. 3. Diffuse body wall edema. 4. Stable crescent of material in the gallbladder fossa compared to 07/19/2023, likely postsurgical. Electronically Signed   By: Donnal Fusi M.D.   On:  08/27/2023 06:20   DG Chest Portable 1 View Result Date: 08/26/2023 CLINICAL DATA:  Coughing, dialysis patient, hyperglycemia EXAM: PORTABLE CHEST 1 VIEW COMPARISON:  08/14/2023 FINDINGS: Stable cardiomegaly. Interstitial coarsening greatest in the lower lungs. Hazy ground-glass opacities throughout the right lung greatest in the right lower lung. No pleural effusion or pneumothorax. IMPRESSION: Hazy ground-glass opacities throughout the  right lung greatest in the right lower lung. Lower lung interstitial thickening. Findings may be due to edema or infection. Electronically Signed   By: Rozell Cornet M.D.   On: 08/26/2023 22:13   DG Chest 2 View Result Date: 08/14/2023 CLINICAL DATA:  Chest pain. EXAM: CHEST - 2 VIEW COMPARISON:  07/25/2023. FINDINGS: Low lung volume. Bilateral lung fields are clear. Bilateral costophrenic angles are clear. Mildly enlarged cardio-mediastinal silhouette, which is likely accentuated by low lung volume and AP technique. No acute osseous abnormalities. The soft tissues are within normal limits. IMPRESSION: No active cardiopulmonary disease. Electronically Signed   By: Beula Brunswick M.D.   On: 08/14/2023 11:36     TODAY-DAY OF DISCHARGE:  Subjective:   Ashley Freeman today has no headache,no chest abdominal pain,no new weakness tingling or numbness, feels much better wants to go home today.   Objective:   Blood pressure (!) 140/97, pulse 86, temperature 98.3 F (36.8 C), temperature source Oral, resp. rate 17, height 5\' 3"  (1.6 m), weight 66.9 kg, SpO2 97%, unknown if currently breastfeeding.  Intake/Output Summary (Last 24 hours) at 08/31/2023 0853 Last data filed at 08/31/2023 0520 Gross per 24 hour  Intake 180 ml  Output 600 ml  Net -420 ml   Filed Weights   08/27/23 2000 08/30/23 1450 08/30/23 1638  Weight: 67.9 kg 68.5 kg 66.9 kg    Exam: Awake Alert, Oriented *3, No new F.N deficits, Normal affect [place].AT,PERRAL Supple Neck,No JVD, No cervical lymphadenopathy appriciated.  Symmetrical Chest wall movement, Good air movement bilaterally, CTAB RRR,No Gallops,Rubs or new Murmurs, No Parasternal Heave +ve B.Sounds, Abd Soft, Non tender, No organomegaly appriciated, No rebound -guarding or rigidity. No Cyanosis, Clubbing or edema, No new Rash or bruise   PERTINENT RADIOLOGIC STUDIES: MR LUMBAR SPINE WO CONTRAST Result Date: 08/30/2023 CLINICAL DATA:  Low back pain, prior  surgery, new symptoms EXAM: MRI LUMBAR SPINE WITHOUT CONTRAST TECHNIQUE: Multiplanar, multisequence MR imaging of the lumbar spine was performed. No intravenous contrast was administered. COMPARISON:  None Available. FINDINGS: Motion limited study.  Within this limitation: Segmentation: Standard segmentation is assumed. The inferior-most fully formed intervertebral disc labeled L5-S1. Alignment:  No substantial sagittal subluxation. Vertebrae: No fracture, evidence of discitis, or suspicious bone lesion. Conus medullaris and cauda equina: Conus extends to the T12-L1 level. Conus and cauda equina appear normal. Paraspinal and other soft tissues: Please see recent CT of the abdomen/pelvis for intra-abdominal intrapelvic valuation. Disc levels: No significant disc protrusion, foraminal stenosis, or canal stenosis. IMPRESSION: No evidence of acute abnormality or significant stenosis. Electronically Signed   By: Stevenson Elbe M.D.   On: 08/30/2023 23:08   ECHOCARDIOGRAM COMPLETE Result Date: 08/29/2023    ECHOCARDIOGRAM REPORT   Patient Name:   Ashley Freeman [facility] Date of Exam: 08/29/2023 Medical Rec #:  [id number]                    Height:       63.0 in Accession #:    [id number]                   Weight:  149.7 lb Date of Birth:  [birth year]                     BSA:          1.710 m Patient Age:    33 years                     BP:           147/85 mmHg Patient Gender: F                            HR:           88 bpm. Exam Location:  Inpatient Procedure: 2D Echo, 3D Echo, Cardiac Doppler, Color Doppler and Strain Analysis            (Both Spectral and Color Flow Doppler were utilized during            procedure). Indications:    Nonischemic cardiomyopathy  History:        Patient has prior history of Echocardiogram examinations, most                 recent 07/01/2023. Risk Factors:Dyslipidemia and Hypertension.                 ESRD.  Sonographer:    Juanita Shaw Referring Phys: [id number] Tylene Galla M Jamisha Hoeschen  IMPRESSIONS  1. Left ventricular ejection fraction, by estimation, is 30 to 35%. Left ventricular ejection fraction by 3D volume is 34 %. Left ventricular ejection fraction by 2D MOD biplane is 29.6 %. The left ventricle has moderately decreased function. The left ventricle demonstrates global hypokinesis. Left ventricular diastolic parameters are consistent with Grade II diastolic dysfunction (pseudonormalization).  2. Right ventricular systolic function is normal. The right ventricular size is mildly enlarged. There is normal pulmonary artery systolic pressure. The estimated right ventricular systolic pressure is 34.4 mmHg.  3. Left atrial size was mildly dilated.  4. Right atrial size was mildly dilated.  5. The mitral valve is normal in structure. No evidence of mitral valve regurgitation. No evidence of mitral stenosis.  6. The tricuspid valve is abnormal. Tricuspid valve regurgitation is severe.  7. The aortic valve is tricuspid. Aortic valve regurgitation is not visualized. No aortic stenosis is present.  8. The inferior vena cava is normal in size with <50% respiratory variability, suggesting right atrial pressure of 8 mmHg.  9. A small pericardial effusion is present. The pericardial effusion is circumferential. FINDINGS  Left Ventricle: Left ventricular ejection fraction, by estimation, is 30 to 35%. Left ventricular ejection fraction by 2D MOD biplane is 29.6 %. Left ventricular ejection fraction by 3D volume is 34 %. The left ventricle has moderately decreased function. The left ventricle demonstrates global hypokinesis. The left ventricular internal cavity size was normal in size. There is no left ventricular hypertrophy. Left ventricular diastolic parameters are consistent with Grade II diastolic dysfunction  (pseudonormalization). Right Ventricle: The right ventricular size is mildly enlarged. No increase in right ventricular wall thickness. Right ventricular systolic function is normal. There is  normal pulmonary artery systolic pressure. The tricuspid regurgitant velocity is 2.57  m/s, and with an assumed right atrial pressure of 8 mmHg, the estimated right ventricular systolic pressure is 34.4 mmHg. Left Atrium: Left atrial size was mildly dilated. Right Atrium: Right atrial size was mildly dilated. Pericardium: A small pericardial effusion is present. The pericardial effusion is circumferential. Mitral Valve:  The mitral valve is normal in structure. No evidence of mitral valve regurgitation. No evidence of mitral valve stenosis. MV peak gradient, 5.7 mmHg. The mean mitral valve gradient is 2.0 mmHg. Tricuspid Valve: The tricuspid valve is abnormal. Tricuspid valve regurgitation is severe. Aortic Valve: The aortic valve is tricuspid. Aortic valve regurgitation is not visualized. No aortic stenosis is present. Aortic valve mean gradient measures 6.0 mmHg. Aortic valve peak gradient measures 9.0 mmHg. Aortic valve area, by VTI measures 1.38 cm. Pulmonic Valve: The pulmonic valve was normal in structure. Pulmonic valve regurgitation is mild. Aorta: The aortic root is normal in size and structure. Venous: The inferior vena cava is normal in size with less than 50% respiratory variability, suggesting right atrial pressure of 8 mmHg. IAS/Shunts: No atrial level shunt detected by color flow Doppler. Additional Comments: 3D was performed not requiring image post processing on an independent workstation and was abnormal.  LEFT VENTRICLE PLAX 2D                        Biplane EF (MOD) LVIDd:         4.40 cm         LV Biplane EF:   Left LVIDs:         3.90 cm                          ventricular LV PW:         0.80 cm                          ejection LV IVS:        0.90 cm                          fraction by LVOT diam:     1.70 cm                          2D MOD LV SV:         42                               biplane is LV SV Index:   25                               29.6 %. LVOT Area:     2.27 cm                                 Diastology                                LV e' medial:    6.08 cm/s LV Volumes (MOD)               LV E/e' medial:  18.6 LV vol d, MOD    157.0 ml      LV e' lateral:   9.00 cm/s A2C:                           LV E/e' lateral: 12.6 LV vol d, MOD  117.0 ml A4C: LV vol s, MOD    97.9 ml       3D Volume EF A2C:                           LV 3D EF:    Left LV vol s, MOD    93.4 ml                    ventricul A4C:                                        ar LV SV MOD A2C:   59.1 ml                    ejection LV SV MOD A4C:   117.0 ml                   fraction LV SV MOD BP:    40.5 ml                    by 3D                                             volume is                                             34 %.                                 3D Volume EF:                                3D EF:        34 %                                LV EDV:       127 ml                                LV ESV:       83 ml                                LV SV:        44 ml RIGHT VENTRICLE             IVC RV Basal diam:  4.10 cm     IVC diam: 1.70 cm RV Mid diam:    3.70 cm RV S prime:     12.20 cm/s TAPSE (M-mode): 2.0 cm LEFT ATRIUM             Index        RIGHT ATRIUM           Index LA diam:        3.40 cm 1.99  cm/m   RA Area:     22.80 cm LA Vol (A2C):   65.8 ml 38.49 ml/m  RA Volume:   71.80 ml  42.00 ml/m LA Vol (A4C):   45.3 ml 26.50 ml/m LA Biplane Vol: 55.7 ml 32.58 ml/m  AORTIC VALVE                     PULMONIC VALVE AV Area (Vmax):    1.31 cm      PV Vmax:          1.46 m/s AV Area (Vmean):   1.19 cm      PV Peak grad:     8.6 mmHg AV Area (VTI):     1.38 cm      PR End Diast Vel: 8.29 msec AV Vmax:           150.00 cm/s AV Vmean:          114.000 cm/s AV VTI:            0.305 m AV Peak Grad:      9.0 mmHg AV Mean Grad:      6.0 mmHg LVOT Vmax:         86.90 cm/s LVOT Vmean:        59.700 cm/s LVOT VTI:          0.186 m LVOT/AV VTI ratio: 0.61  AORTA Ao Root diam: 2.70 cm Ao Asc diam:  2.60 cm  MITRAL VALVE                TRICUSPID VALVE MV Area (PHT): 4.01 cm     TR Peak grad:   26.4 mmHg MV Area VTI:   2.22 cm     TR Vmax:        257.00 cm/s MV Peak grad:  5.7 mmHg MV Mean grad:  2.0 mmHg     SHUNTS MV Vmax:       1.19 m/s     Systemic VTI:  0.19 m MV Vmean:      70.4 cm/s    Systemic Diam: 1.70 cm MV Decel Time: 189 msec MV E velocity: 113.00 cm/s MV A velocity: 93.30 cm/s MV E/A ratio:  1.21 Dalton McleanMD Electronically signed by Archer Bear Signature Date/Time: 08/29/2023/11:07:43 AM    Final      PERTINENT LAB RESULTS: CBC: No results for input(s): "WBC", "HGB", "HCT", "PLT" in the last 72 hours. CMET CMP     Component Value Date/Time   NA 140 08/29/2023 1150   NA 136 07/13/2021 1553   NA 137 11/12/2013 0423   K 4.3 08/29/2023 1150   K 4.2 11/12/2013 0423   CL 101 08/29/2023 1150   CL 107 11/12/2013 0423   CO2 21 (L) 08/29/2023 1150   CO2 23 11/12/2013 0423   GLUCOSE 67 (L) 08/29/2023 1150   GLUCOSE 339 (H) 11/12/2013 0423   BUN 51 (H) 08/29/2023 1150   BUN 51 (H) 07/13/2021 1553   BUN 7 11/12/2013 0423   CREATININE 6.59 (H) 08/29/2023 1150   CREATININE 0.59 (L) 11/12/2013 0423   CREATININE 0.51 08/24/2013 0957   CALCIUM  8.1 (L) 08/29/2023 1150   CALCIUM  8.2 (L) 11/12/2013 0423   PROT 6.2 (L) 08/26/2023 2228   PROT 5.9 (L) 09/08/2019 0945   PROT 8.0 11/10/2013 2043   ALBUMIN 3.4 (L) 08/29/2023 1150   ALBUMIN 2.9 (L) 09/08/2019 0945   ALBUMIN 3.9 11/10/2013 2043   AST 46 (H) 08/26/2023 2228  AST 12 (L) 11/10/2013 2043   ALT 49 (H) 08/26/2023 2228   ALT 10 (L) 11/10/2013 2043   ALKPHOS 362 (H) 08/26/2023 2228   ALKPHOS 77 11/10/2013 2043   BILITOT 1.2 08/26/2023 2228   BILITOT <0.2 09/08/2019 0945   BILITOT 0.5 11/10/2013 2043   GFR 27.98 (L) 05/24/2021 1552   EGFR 24 (L) 07/13/2021 1553   GFRNONAA 8 (L) 08/29/2023 1150   GFRNONAA >60 11/12/2013 0423   GFRNONAA >89 08/24/2013 0957    GFR Estimated Creatinine Clearance: 11.4 mL/min (A) (by C-G  formula based on SCr of 6.59 mg/dL (H)). No results for input(s): "LIPASE", "AMYLASE" in the last 72 hours. No results for input(s): "CKTOTAL", "CKMB", "CKMBINDEX", "TROPONINI" in the last 72 hours. Invalid input(s): "POCBNP" No results for input(s): "DDIMER" in the last 72 hours. No results for input(s): "HGBA1C" in the last 72 hours. No results for input(s): "CHOL", "HDL", "LDLCALC", "TRIG", "CHOLHDL", "LDLDIRECT" in the last 72 hours. No results for input(s): "TSH", "T4TOTAL", "T3FREE", "THYROIDAB" in the last 72 hours.  Invalid input(s): "FREET3" No results for input(s): "VITAMINB12", "FOLATE", "FERRITIN", "TIBC", "IRON ", "RETICCTPCT" in the last 72 hours. Coags: No results for input(s): "INR" in the last 72 hours.  Invalid input(s): "PT" Microbiology: Recent Results (from the past 240 hours)  MRSA Next Gen by PCR, Nasal     Status: None   Collection Time: 08/28/23  4:08 AM   Specimen: Nasal Mucosa; Nasal Swab  Result Value Ref Range Status   MRSA by PCR Next Gen NOT DETECTED NOT DETECTED Final    Comment: (NOTE) The GeneXpert MRSA Assay (FDA approved for NASAL specimens only), is one component of a comprehensive MRSA colonization surveillance program. It is not intended to diagnose MRSA infection nor to guide or monitor treatment for MRSA infections. Test performance is not FDA approved in patients less than 72 years old. Performed at [facility], [address], [place], [place] [address]   Body fluid culture w Gram Stain     Status: None   Collection Time: 08/28/23 12:30 PM   Specimen: Abdomen; Peritoneal Fluid  Result Value Ref Range Status   Specimen Description PERITONEAL  Final   Special Requests NONE  Final   Gram Stain NO WBC SEEN NO ORGANISMS SEEN   Final   Culture   Final    NO GROWTH 3 DAYS Performed at [facility], [address], [place], [place] [address]    Report Status 08/31/2023 FINAL  Final    FURTHER DISCHARGE INSTRUCTIONS:  Get  Medicines reviewed and adjusted: Please take all your medications with you for your next visit with your Primary MD  Laboratory/radiological data: Please request your Primary MD to go over all hospital tests and procedure/radiological results at the follow up, please ask your Primary MD to get all Hospital records sent to his/her office.  In some cases, they will be blood work, cultures and biopsy results pending at the time of your discharge. Please request that your primary care M.D. goes through all the records of your hospital data and follows up on these results.  Also Note the following: If you experience worsening of your admission symptoms, develop shortness of breath, life threatening emergency, suicidal or homicidal thoughts you must seek medical attention immediately by calling 911 or calling your MD immediately  if symptoms less severe.  You must read complete instructions/literature along with all the possible adverse reactions/side effects for all the Medicines you take and that  have been prescribed to you. Take any new Medicines after you have completely understood and accpet all the possible adverse reactions/side effects.   Do not drive when taking Pain medications or sleeping medications (Benzodaizepines)  Do not take more than prescribed Pain, Sleep and Anxiety Medications. It is not advisable to combine anxiety,sleep and pain medications without talking with your primary care practitioner  Special Instructions: If you have smoked or chewed Tobacco  in the last 2 yrs please stop smoking, stop any regular Alcohol  and or any Recreational drug use.  Wear Seat belts while driving.  Please note: You were cared for by a hospitalist during your hospital stay. Once you are discharged, your primary care physician will handle any further medical issues. Please note that NO REFILLS for any discharge medications will be authorized once you are discharged, as it is imperative that you  return to your primary care physician (or establish a relationship with a primary care physician if you do not have one) for your post hospital discharge needs so that they can reassess your need for medications and monitor your lab values.  Total Time spent coordinating discharge including counseling, education and face to face time equals greater than 30 minutes.  SignedKimberly Penna 08/31/2023 8:53 AM

## 2023-08-31 NOTE — Progress Notes (Signed)
 We forgot to prioritize her HD so still hasn't been dialyzed. Spoke w/ pt who is mostly worried about her severe pain, and is asking for something "stronger". She agrees to HD this evening. She will have to be dc'd in the morning then. Has chronic pain, will order dilaudid  IV 0.5-1 mg q 4 hrs x 2 doses.  Have d/w primary MD.   Larry Poag  MD  [facility] 08/31/2023, 5:18 PM

## 2023-09-01 DIAGNOSIS — E1022 Type 1 diabetes mellitus with diabetic chronic kidney disease: Secondary | ICD-10-CM | POA: Diagnosis not present

## 2023-09-01 DIAGNOSIS — Z992 Dependence on renal dialysis: Secondary | ICD-10-CM | POA: Diagnosis not present

## 2023-09-01 DIAGNOSIS — N186 End stage renal disease: Secondary | ICD-10-CM | POA: Diagnosis not present

## 2023-09-01 LAB — GLUCOSE, CAPILLARY
Glucose-Capillary: 289 mg/dL — ABNORMAL HIGH (ref 70–99)
Glucose-Capillary: 180 mg/dL — ABNORMAL HIGH (ref 70–99)
Glucose-Capillary: 34 mg/dL — CL (ref 70–99)
Glucose-Capillary: 39 mg/dL — CL (ref 70–99)
Glucose-Capillary: 203 mg/dL — ABNORMAL HIGH (ref 70–99)
Glucose-Capillary: 47 mg/dL — ABNORMAL LOW (ref 70–99)
Glucose-Capillary: 50 mg/dL — ABNORMAL LOW (ref 70–99)

## 2023-09-01 LAB — LIPASE, FLUID: Lipase-Fluid: 13 U/L

## 2023-09-01 MED ORDER — INSULIN GLARGINE-YFGN 100 UNIT/ML ~~LOC~~ SOLN
12.0000 [IU] | Freq: Every day | SUBCUTANEOUS | Status: DC
  Filled 2023-09-01: qty 0.12

## 2023-09-01 MED ORDER — INSULIN GLARGINE-YFGN 100 UNIT/ML ~~LOC~~ SOLN
7.0000 [IU] | Freq: Every day | SUBCUTANEOUS | Status: DC
  Administered 2023-09-02: 7 [IU] via SUBCUTANEOUS
  Filled 2023-09-01 (×2): qty 0.07

## 2023-09-01 MED ORDER — INSULIN ASPART 100 UNIT/ML IJ SOLN
0.0000 [IU] | Freq: Three times a day (TID) | INTRAMUSCULAR | Status: DC
  Administered 2023-09-01: 3 [IU] via SUBCUTANEOUS

## 2023-09-01 MED ORDER — INSULIN GLARGINE-YFGN 100 UNIT/ML ~~LOC~~ SOLN
10.0000 [IU] | Freq: Every day | SUBCUTANEOUS | Status: DC
  Filled 2023-09-01: qty 0.1

## 2023-09-01 MED ORDER — INSULIN ASPART 100 UNIT/ML IJ SOLN: 0.0000 [IU] | Freq: Every day | INTRAMUSCULAR | Status: DC

## 2023-09-01 NOTE — Plan of Care (Signed)
  Problem: Education: Goal: Ability to describe self-care measures that may prevent or decrease complications (Diabetes Survival Skills Education) will improve Outcome: Progressing   Problem: Fluid Volume: Goal: Ability to maintain a balanced intake and output will improve Outcome: Progressing   Problem: Nutritional: Goal: Maintenance of adequate nutrition will improve Outcome: Not Progressing

## 2023-09-01 NOTE — Progress Notes (Signed)
 [facility]                                                                                                                                                                         Patient Demographics  Ashley Freeman, is a 33 y.o. female  NGE:[id number]  [id number]  DOB - 11-09-1990  Admit date - 08/26/2023  Admitting Physician Burton Casey, MD  Outpatient Primary MD for the patient is [name], [name], MD  LOS - 4   Chief Complaint  Patient presents with   Abdominal Pain   Hyperglycemia        Assessment & Plan    Patient seen, she was discharged yesterday however HD could not be done, she is in bed in no distress, awaits HD today, post dialysis likely discharge later today or early tomorrow depending on the timing of her HD, no further issues, Vital signs stable, patient feels fine.      Medications  Scheduled Meds:  carvedilol   25 mg Oral BID WC   Chlorhexidine  Gluconate Cloth  6 each Topical Q0600   DULoxetine   20 mg Oral Daily   heparin  injection (subcutaneous)  5,000 Units Subcutaneous Q8H   insulin  aspart  0-5 Units Subcutaneous QHS   insulin  aspart  0-6 Units Subcutaneous TID WC   insulin  glargine-yfgn  12 Units Subcutaneous QHS   losartan   50 mg Oral Daily   polyethylene glycol  17 g Oral Daily   rosuvastatin   10 mg Oral Daily   sevelamer  carbonate  3,200 mg Oral TID WC   Continuous Infusions: PRN Meds:.acetaminophen  **OR** acetaminophen , cyclobenzaprine , dextrose , HYDROcodone -acetaminophen , HYDROmorphone  (DILAUDID ) injection, hydrOXYzine , naLOXone  (NARCAN )  injection    Time Spent in minutes   10 minutes   Lynnwood Sauer M.D on 09/01/2023 at 10:28 AM  Between 7am to 7pm - Pager - [phone]  After 7pm go to [email or web address] - password [email or web address]  And look for the night coverage person covering for me after hours  [facility] Office   [phone]    Subjective:   Ashley Freeman today has, No headache, No chest pain, No new weakness tingling or numbness, No Cough - SOB.  Chronic aches and pains.  Objective:   Vitals:   09/01/23 0301 09/01/23 0700 09/01/23 0739 09/01/23 0810  BP: (!) 162/90  (!) 140/95 (!) 140/95  Pulse: 80  83 79  Resp: 14 17 (!) 23 17  Temp: 98 F (36.7 C)  97.8 F (36.6 C) 97.9 F (36.6 C)  TempSrc: Oral  Oral Oral  SpO2: 98%  95% 100%  Weight:      Height:        Wt Readings from Last 3 Encounters:  09/01/23 64.9 kg  08/15/23  63 kg  07/25/23 63 kg     Intake/Output Summary (Last 24 hours) at 09/01/2023 1028 Last data filed at 08/31/2023 1200 Gross per 24 hour  Intake 240 ml  Output --  Net 240 ml    Exam  Awake Alert, No new F.N deficits, Normal affect, in no distress whatsoever [place].AT,PERRAL Supple Neck, No JVD,   Symmetrical Chest wall movement, Good air movement bilaterally, CTAB RRR,No Gallops, Rubs or new Murmurs,  +ve B.Sounds, Abd Soft, No tenderness,   No Cyanosis, Clubbing or edema   Data Review

## 2023-09-01 NOTE — Progress Notes (Signed)
 RN was unsuccessful in cannulating the 2nd needle blood return sluggish. Did attempt to put another but still successful. MD informed. Will indorse to morning nurse to try again in AM.

## 2023-09-01 NOTE — Progress Notes (Signed)
 [facility] Progress Note   Subjective:   RN was unable to successfully cannulate AVF yesterday. Will try again this evening. Pt denies SOB, dizziness. Feels like her abdomen is getting a bit more distended.   Objective Vitals:   09/01/23 0800 09/01/23 0810 09/01/23 0900 09/01/23 1100  BP:  (!) 140/95  (!) 149/100  Pulse:  79  82  Resp: 20 17 17 19   Temp:  97.9 F (36.6 C)  (!) 97.5 F (36.4 C)  TempSrc:  Oral  Oral  SpO2:  100%  97%  Weight:      Height:       Physical Exam General: sleeping female, awakens to voice, NAD  Heart: RRR, no murmurs, rubs or gallops Lungs: CTA bilaterally, respirations unlabored Abdomen: + mild distention, +BS Extremities: no edema b/l lower extremities Dialysis Access:  AVG + thrill/bruit throughout  Additional Objective Labs: Basic Metabolic Panel: Recent Labs  Lab 08/27/23 0503 08/27/23 1205 08/29/23 1150  NA 139 136 140  K 4.3 4.6 4.3  CL 103 102 101  CO2 24 20* 21*  GLUCOSE 149* 158* 67*  BUN 46* 48* 51*  CREATININE 6.10* 6.32* 6.59*  CALCIUM  7.7* 7.6* 8.1*  PHOS  --   --  4.4   Liver Function Tests: Recent Labs  Lab 08/26/23 2228 08/28/23 1035 08/29/23 1150  AST 46*  --   --   ALT 49*  --   --   ALKPHOS 362*  --   --   BILITOT 1.2  --   --   PROT 6.2*  --   --   ALBUMIN 3.2* 3.3* 3.4*   Recent Labs  Lab 08/26/23 2228  LIPASE 27   CBC: Recent Labs  Lab 08/26/23 2228 08/27/23 0526  WBC 6.8 9.1  HGB 12.1 12.2  HCT 41.7 41.5  MCV 94.6 92.4  PLT 159 189   Blood Culture    Component Value Date/Time   SDES PERITONEAL 08/28/2023 1230   SPECREQUEST NONE 08/28/2023 1230   CULT  08/28/2023 1230    NO GROWTH 3 DAYS Performed at [facility], [address], [place], [place] [address]    REPTSTATUS 08/31/2023 FINAL 08/28/2023 1230    Cardiac Enzymes: No results for input(s): "CKTOTAL", "CKMB", "CKMBINDEX", "TROPONINI" in the last 168 hours. CBG: Recent Labs  Lab 08/31/23 2143  09/01/23 0726 09/01/23 1053 09/01/23 1057 09/01/23 1103  GLUCAP 405* 289* 39* 34* 180*   Iron  Studies: No results for input(s): "IRON ", "TIBC", "TRANSFERRIN", "FERRITIN" in the last 72 hours. @lablastinr3 @ Studies/Results: MR LUMBAR SPINE WO CONTRAST Result Date: 08/30/2023 CLINICAL DATA:  Low back pain, prior surgery, new symptoms EXAM: MRI LUMBAR SPINE WITHOUT CONTRAST TECHNIQUE: Multiplanar, multisequence MR imaging of the lumbar spine was performed. No intravenous contrast was administered. COMPARISON:  None Available. FINDINGS: Motion limited study.  Within this limitation: Segmentation: Standard segmentation is assumed. The inferior-most fully formed intervertebral disc labeled L5-S1. Alignment:  No substantial sagittal subluxation. Vertebrae: No fracture, evidence of discitis, or suspicious bone lesion. Conus medullaris and cauda equina: Conus extends to the T12-L1 level. Conus and cauda equina appear normal. Paraspinal and other soft tissues: Please see recent CT of the abdomen/pelvis for intra-abdominal intrapelvic valuation. Disc levels: No significant disc protrusion, foraminal stenosis, or canal stenosis. IMPRESSION: No evidence of acute abnormality or significant stenosis. Electronically Signed   By: Stevenson Elbe M.D.   On: 08/30/2023 23:08   Medications:   carvedilol   25 mg Oral BID WC  Chlorhexidine  Gluconate Cloth  6 each Topical Q0600   DULoxetine   20 mg Oral Daily   heparin  injection (subcutaneous)  5,000 Units Subcutaneous Q8H   insulin  aspart  0-6 Units Subcutaneous TID WC   insulin  glargine-yfgn  10 Units Subcutaneous QHS   losartan   50 mg Oral Daily   polyethylene glycol  17 g Oral Daily   rosuvastatin   10 mg Oral Daily   sevelamer  carbonate  3,200 mg Oral TID WC    Dialysis Orders:  TTS [facility] [address] [facility]  From 08/14/23 --> 3h   B350  57.5kg  R AVG  Heparin  1600 + 600u/hr  Assessment/Plan: HHS/ DKA: per primary team. Ongoing abdominal  pain Volume overload: w/ pulm edema by CXR, improved s/p HD and paracentesis. Attempted extra HD but only tolerated 1.5 hours due to abdominal pain, then unable to cannulate last night. AVG has a strong thrill, will try again this evening. Attempt to keep volume down to prevent ascites. Will get standing weights today.  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 Resumed her phos binders  Ramona Burner, PA-C 09/01/2023, 11:11 AM  [facility] Pager: [phone]

## 2023-09-01 NOTE — Plan of Care (Signed)

## 2023-09-02 ENCOUNTER — Other Ambulatory Visit (HOSPITAL_COMMUNITY): Payer: Self-pay

## 2023-09-02 DIAGNOSIS — E1010 Type 1 diabetes mellitus with ketoacidosis without coma: Secondary | ICD-10-CM | POA: Diagnosis not present

## 2023-09-02 LAB — GLUCOSE, CAPILLARY
Glucose-Capillary: 224 mg/dL — ABNORMAL HIGH (ref 70–99)
Glucose-Capillary: 244 mg/dL — ABNORMAL HIGH (ref 70–99)
Glucose-Capillary: 256 mg/dL — ABNORMAL HIGH (ref 70–99)

## 2023-09-02 MED ORDER — HYDROMORPHONE HCL 1 MG/ML IJ SOLN
0.5000 mg | INTRAMUSCULAR | Status: AC
  Administered 2023-09-02: 0.5 mg via INTRAVENOUS
  Filled 2023-09-02: qty 0.5

## 2023-09-02 NOTE — Progress Notes (Signed)
   09/02/23 0050  Vitals  Temp 97.7 F (36.5 C)  Temp Source Oral  BP (!) 175/114  MAP (mmHg) 131  BP Location Left Arm  Pulse Rate (!) 101  Resp 17  Oxygen Therapy  SpO2 95 %  O2 Device Room Air  During Treatment Monitoring  Blood Flow Rate (mL/min) 0 mL/min  Arterial Pressure (mmHg) -0.8 mmHg  Venous Pressure (mmHg) -1.82 mmHg  TMP (mmHg) -52.12 mmHg  Ultrafiltration Rate (mL/min) 1153 mL/min  Dialysate Flow Rate (mL/min) 0 ml/min  Dialysate Potassium Concentration 3  Dialysate Calcium  Concentration 2.5  Duration of HD Treatment -hour(s) 3 hour(s)  Cumulative Fluid Removed (mL) per Treatment  2500.29  HD Safety Checks Performed Yes  Intra-Hemodialysis Comments Progressing as prescribed  Post Treatment  Dialyzer Clearance Lightly streaked  Liters Processed 59.5  Fluid Removed (mL) 2500 mL  Tolerated HD Treatment Yes  AVG/AVF Arterial Site Held (minutes) 8 minutes  AVG/AVF Venous Site Held (minutes) 7 minutes  Fistula / Graft Right Upper arm Arteriovenous vein graft  Placement Date/Time: 07/06/22 1013   Placed prior to admission: No  Orientation: Right  Access Location: Upper arm  Access Type: Arteriovenous vein graft  Site Condition No complications  Fistula / Graft Assessment Present;Thrill;Bruit  Status Deaccessed  Needle Size 16  Drainage Description None   Difficulty  cannulating Pt fistula using 15 gage.  use 16 instead blood flow 300-350

## 2023-09-02 NOTE — Discharge Instructions (Addendum)
 Follow with Primary MD  [name], [name], MD in 1-2 weeks  Follow-up with your primary nephrologist as previously scheduled  Follow-up with your hemodialysis center at your prior schedule  Please get a complete blood count and chemistry panel checked by your Primary MD at your next visit, and again as instructed by your Primary MD.  Get Medicines reviewed and adjusted: Please take all your medications with you for your next visit with your Primary MD  Laboratory/radiological data: Please request your Primary MD to go over all hospital tests and procedure/radiological results at the follow up, please ask your Primary MD to get all Hospital records sent to his/her office.  In some cases, they will be blood work, cultures and biopsy results pending at the time of your discharge. Please request that your primary care M.D. follows up on these results.  Also Note the following: If you experience worsening of your admission symptoms, develop shortness of breath, life threatening emergency, suicidal or homicidal thoughts you must seek medical attention immediately by calling 911 or calling your MD immediately  if symptoms less severe.  You must read complete instructions/literature along with all the possible adverse reactions/side effects for all the Medicines you take and that have been prescribed to you. Take any new Medicines after you have completely understood and accpet all the possible adverse reactions/side effects.   Do not drive when taking Pain medications or sleeping medications (Benzodaizepines)  Do not take more than prescribed Pain, Sleep and Anxiety Medications. It is not advisable to combine anxiety,sleep and pain medications without talking with your primary care practitioner  Special Instructions: If you have smoked or chewed Tobacco  in the last 2 yrs please stop smoking, stop any regular Alcohol  and or any Recreational drug use.  Wear Seat belts while driving.  Please  note: You were cared for by a hospitalist during your hospital stay. Once you are discharged, your primary care physician will handle any further medical issues. Please note that NO REFILLS for any discharge medications will be authorized once you are discharged, as it is imperative that you return to your primary care physician (or establish a relationship with a primary care physician if you do not have one) for your post hospital discharge needs so that they can reassess your need for medications and monitor your lab values.Aaron Aas

## 2023-09-02 NOTE — Progress Notes (Signed)
 [facility]                                                                                                                                                                         Patient Demographics  Ashley Freeman, is a 33 y.o. female  AOZ:[id number]  NGE:[id number]  DOB - 03/19/1991  Admit date - 08/26/2023  Admitting Physician Burton Casey, MD  Outpatient Primary MD for the patient is [name], [name], MD  LOS - 5   Chief Complaint  Patient presents with   Abdominal Pain   Hyperglycemia        Assessment & Plan    Patient seen, she was discharged on 08/31/2023 however HD could not be done in a timely fashion, it was done finally yesterday evening, she is in bed in no distress, awaits HD today, post dialysis likely discharge later today or early tomorrow depending on the timing of her HD, no further issues, Vital signs stable, patient feels fine.  Of note her blood sugars are extremely labile due to her inconsistent oral intake and extremely brittle type 1 diabetes mellitus in the setting of ESRD.  Requested to check her CBGs q. ACH S, requested to have a consistent schedule to her diet intake.  Patient symptom-free eating breakfast eager to go home, did ask for narcotics to be refilled prior to be discharged which was politely declined and requested to contact her PCP for the same.    Medications  Scheduled Meds:  carvedilol   25 mg Oral BID WC   Chlorhexidine  Gluconate Cloth  6 each Topical Q0600   DULoxetine   20 mg Oral Daily   heparin  injection (subcutaneous)  5,000 Units Subcutaneous Q8H   insulin  aspart  0-6 Units Subcutaneous TID WC   insulin  glargine-yfgn  7 Units Subcutaneous QHS   losartan   50 mg Oral Daily   polyethylene glycol  17 g Oral Daily   rosuvastatin   10 mg Oral Daily   sevelamer  carbonate  3,200 mg Oral TID WC   Continuous Infusions: PRN  Meds:.acetaminophen  **OR** acetaminophen , cyclobenzaprine , dextrose , HYDROcodone -acetaminophen , hydrOXYzine , naLOXone  (NARCAN )  injection    Time Spent in minutes   10 minutes   Lynnwood Sauer M.D on 09/02/2023 at 7:43 AM  Between 7am to 7pm - Pager - [phone]  After 7pm go to [email or web address] - password [email or web address]  And look for the night coverage person covering for me after hours  [facility] Office  [phone]    Subjective:   Ashley Freeman today has, No headache, No chest pain, No new weakness tingling or numbness, No Cough - SOB.   Sitting up in bed having breakfast, feels good this morning, asking for narcotics to be refilled when she  is going home, politely declined.  Objective:   Vitals:   09/01/23 2230 09/02/23 0050 09/02/23 0435 09/02/23 0457  BP: (!) 185/110 (!) 175/114 (!) 154/95   Pulse: 98 (!) 101    Resp: 16 17 17    Temp:  97.7 F (36.5 C) 98 F (36.7 C)   TempSrc:  Oral Oral   SpO2: 100% 95% 94%   Weight:    60.1 kg  Height:        Wt Readings from Last 3 Encounters:  09/02/23 60.1 kg  08/15/23 63 kg  07/25/23 63 kg     Intake/Output Summary (Last 24 hours) at 09/02/2023 0743 Last data filed at 09/02/2023 0050 Gross per 24 hour  Intake --  Output 2500 ml  Net -2500 ml    Exam  Awake Alert, No new F.N deficits, Normal affect, in no distress whatsoever [place].AT,PERRAL Supple Neck, No JVD,   Symmetrical Chest wall movement, Good air movement bilaterally, CTAB RRR,No Gallops, Rubs or new Murmurs,  +ve B.Sounds, Abd Soft, No tenderness,   No Cyanosis, Clubbing or edema   Data Review

## 2023-09-02 NOTE — Progress Notes (Signed)
 D/C noted. Contacted [facility] to be advised of pt's d/c today and that pt should resume care tomorrow. D/C summary and renal notes faxed to clinic for continuation of care.   Lauraine Polite Renal Navigator [phone]

## 2023-09-02 NOTE — Plan of Care (Signed)
  Problem: Coping: Goal: Ability to adjust to condition or change in health will improve Outcome: Progressing   Problem: Fluid Volume: Goal: Ability to maintain a balanced intake and output will improve Outcome: Progressing   Problem: Health Behavior/Discharge Planning: Goal: Ability to manage health-related needs will improve Outcome: Progressing

## 2023-09-02 NOTE — Progress Notes (Signed)
 Pt being discharged, VSS, pt transported to d/c lounge by [employer]. Education complete, Discharge lounge called with the pt needs  Keane Passe, RN 09/02/2023 8:19 AM

## 2023-09-06 ENCOUNTER — Encounter (HOSPITAL_COMMUNITY): Admission: RE | Payer: Self-pay | Source: Home / Self Care

## 2023-09-06 ENCOUNTER — Ambulatory Visit (HOSPITAL_COMMUNITY): Admission: RE | Admit: 2023-09-06 | Source: Home / Self Care | Admitting: Nephrology

## 2023-09-06 SURGERY — A/V SHUNT INTERVENTION
Anesthesia: LOCAL

## 2023-09-09 ENCOUNTER — Ambulatory Visit (HOSPITAL_COMMUNITY): Admission: RE | Admit: 2023-09-09 | Source: Home / Self Care | Admitting: Nephrology

## 2023-09-09 ENCOUNTER — Emergency Department (HOSPITAL_COMMUNITY)

## 2023-09-09 ENCOUNTER — Emergency Department (HOSPITAL_COMMUNITY)
Admission: EM | Admit: 2023-09-09 | Discharge: 2023-09-09 | Disposition: A | Discharge status: Home / Self Care | Attending: Emergency Medicine | Admitting: Emergency Medicine

## 2023-09-09 ENCOUNTER — Encounter (HOSPITAL_COMMUNITY): Payer: Self-pay | Admitting: Emergency Medicine

## 2023-09-09 DIAGNOSIS — R1084 Generalized abdominal pain: Secondary | ICD-10-CM | POA: Diagnosis present

## 2023-09-09 DIAGNOSIS — Z992 Dependence on renal dialysis: Secondary | ICD-10-CM | POA: Diagnosis not present

## 2023-09-09 DIAGNOSIS — E1022 Type 1 diabetes mellitus with diabetic chronic kidney disease: Secondary | ICD-10-CM | POA: Diagnosis not present

## 2023-09-09 DIAGNOSIS — N186 End stage renal disease: Secondary | ICD-10-CM | POA: Diagnosis not present

## 2023-09-09 DIAGNOSIS — M791 Myalgia, unspecified site: Secondary | ICD-10-CM | POA: Diagnosis not present

## 2023-09-09 DIAGNOSIS — R140 Abdominal distension (gaseous): Secondary | ICD-10-CM | POA: Insufficient documentation

## 2023-09-09 DIAGNOSIS — I509 Heart failure, unspecified: Secondary | ICD-10-CM | POA: Diagnosis not present

## 2023-09-09 DIAGNOSIS — T8089XA Other complications following infusion, transfusion and therapeutic injection, initial encounter: Secondary | ICD-10-CM

## 2023-09-09 LAB — COMPREHENSIVE METABOLIC PANEL WITH GFR
ALT: 33 U/L (ref 0–44)
AST: 51 U/L — ABNORMAL HIGH (ref 15–41)
Albumin: 3.1 g/dL — ABNORMAL LOW (ref 3.5–5.0)
Alkaline Phosphatase: 293 U/L — ABNORMAL HIGH (ref 38–126)
Anion gap: 15 (ref 5–15)
BUN: 46 mg/dL — ABNORMAL HIGH (ref 6–20)
CO2: 21 mmol/L — ABNORMAL LOW (ref 22–32)
Calcium: 8.6 mg/dL — ABNORMAL LOW (ref 8.9–10.3)
Chloride: 100 mmol/L (ref 98–111)
Creatinine, Ser: 7.04 mg/dL — ABNORMAL HIGH (ref 0.44–1.00)
GFR, Estimated: 7 mL/min — ABNORMAL LOW (ref >60)
Glucose, Bld: 387 mg/dL — ABNORMAL HIGH (ref 70–99)
Potassium: 5.2 mmol/L — ABNORMAL HIGH (ref 3.5–5.1)
Sodium: 136 mmol/L (ref 135–145)
Total Bilirubin: 1.6 mg/dL — ABNORMAL HIGH (ref 0.0–1.2)
Total Protein: 5.6 g/dL — ABNORMAL LOW (ref 6.5–8.1)

## 2023-09-09 LAB — RESP PANEL BY RT-PCR (RSV, FLU A&B, COVID)  RVPGX2
Influenza A by PCR: NEGATIVE
Influenza B by PCR: NEGATIVE
Resp Syncytial Virus by PCR: NEGATIVE
SARS Coronavirus 2 by RT PCR: NEGATIVE

## 2023-09-09 LAB — CBC WITH DIFFERENTIAL/PLATELET
Abs Immature Granulocytes: 0.02 10*3/uL (ref 0.00–0.07)
Basophils Absolute: 0.1 10*3/uL (ref 0.0–0.1)
Basophils Relative: 1 %
Eosinophils Absolute: 0.8 10*3/uL — ABNORMAL HIGH (ref 0.0–0.5)
Eosinophils Relative: 10 %
HCT: 37.9 % (ref 36.0–46.0)
Hemoglobin: 11.7 g/dL — ABNORMAL LOW (ref 12.0–15.0)
Immature Granulocytes: 0 %
Lymphocytes Relative: 31 %
Lymphs Abs: 2.5 10*3/uL (ref 0.7–4.0)
MCH: 27.5 pg (ref 26.0–34.0)
MCHC: 30.9 g/dL (ref 30.0–36.0)
MCV: 89.2 fL (ref 80.0–100.0)
Monocytes Absolute: 1 10*3/uL (ref 0.1–1.0)
Monocytes Relative: 12 %
Neutro Abs: 3.9 10*3/uL (ref 1.7–7.7)
Neutrophils Relative %: 46 %
Platelets: 229 10*3/uL (ref 150–400)
RBC: 4.25 MIL/uL (ref 3.87–5.11)
RDW: 15.6 % — ABNORMAL HIGH (ref 11.5–15.5)
WBC: 8.2 10*3/uL (ref 4.0–10.5)
nRBC: 0 % (ref 0.0–0.2)

## 2023-09-09 LAB — URINALYSIS, ROUTINE W REFLEX MICROSCOPIC
Bilirubin Urine: NEGATIVE
Glucose, UA: >=500 mg/dL — AB
Hgb urine dipstick: NEGATIVE
Ketones, ur: 5 mg/dL — AB
Leukocytes,Ua: NEGATIVE
Nitrite: NEGATIVE
Protein, ur: >=300 mg/dL — AB
Specific Gravity, Urine: 1.017 (ref 1.005–1.030)
pH: 8 (ref 5.0–8.0)

## 2023-09-09 LAB — LACTIC ACID, PLASMA
Lactic Acid, Venous: 1.6 mmol/L (ref 0.5–1.9)
Lactic Acid, Venous: 1.4 mmol/L (ref 0.5–1.9)

## 2023-09-09 LAB — TROPONIN I (HIGH SENSITIVITY)
Troponin I (High Sensitivity): 23 ng/L — ABNORMAL HIGH (ref <18)
Troponin I (High Sensitivity): 20 ng/L — ABNORMAL HIGH (ref <18)

## 2023-09-09 LAB — BRAIN NATRIURETIC PEPTIDE: B Natriuretic Peptide: >4500 pg/mL — ABNORMAL HIGH (ref 0.0–100.0)

## 2023-09-09 LAB — BETA-HYDROXYBUTYRIC ACID: Beta-Hydroxybutyric Acid: 0.51 mmol/L — ABNORMAL HIGH (ref 0.05–0.27)

## 2023-09-09 LAB — HEPATITIS B SURFACE ANTIGEN: Hepatitis B Surface Ag: NONREACTIVE

## 2023-09-09 LAB — LIPASE, BLOOD: Lipase: 35 U/L (ref 11–51)

## 2023-09-09 LAB — CBG MONITORING, ED: Glucose-Capillary: 377 mg/dL — ABNORMAL HIGH (ref 70–99)

## 2023-09-09 SURGERY — A/V SHUNT INTERVENTION
Anesthesia: LOCAL

## 2023-09-09 MED ORDER — HEPARIN SODIUM (PORCINE) 1000 UNIT/ML IJ SOLN: 1500.0000 [IU] | Freq: Once | INTRAMUSCULAR | Status: DC

## 2023-09-09 MED ORDER — HEPARIN SODIUM (PORCINE) 1000 UNIT/ML IJ SOLN
INTRAMUSCULAR | Status: AC
  Filled 2023-09-09: qty 5

## 2023-09-09 MED ORDER — HEPARIN SODIUM (PORCINE) 1000 UNIT/ML IJ SOLN
2500.0000 [IU] | Freq: Once | INTRAMUSCULAR | Status: AC
  Administered 2023-09-09: 2500 [IU] via INTRAVENOUS

## 2023-09-09 MED ORDER — FENTANYL CITRATE PF 50 MCG/ML IJ SOSY: 50.0000 ug | PREFILLED_SYRINGE | Freq: Once | INTRAMUSCULAR | Status: DC

## 2023-09-09 MED ORDER — HYDROXYZINE HCL 25 MG PO TABS
25.0000 mg | ORAL_TABLET | Freq: Once | ORAL | Status: AC
  Administered 2023-09-09: 25 mg via ORAL
  Filled 2023-09-09: qty 1

## 2023-09-09 MED ORDER — MORPHINE SULFATE (PF) 4 MG/ML IV SOLN
4.0000 mg | Freq: Once | INTRAVENOUS | Status: AC
  Administered 2023-09-09: 4 mg via INTRAVENOUS
  Filled 2023-09-09: qty 1

## 2023-09-09 MED ORDER — ONDANSETRON HCL 4 MG/2ML IJ SOLN: 4.0000 mg | Freq: Once | INTRAMUSCULAR | Status: DC

## 2023-09-09 MED ORDER — CHLORHEXIDINE GLUCONATE CLOTH 2 % EX PADS: 6.0000 | MEDICATED_PAD | Freq: Every day | CUTANEOUS | Status: DC

## 2023-09-09 NOTE — ED Provider Notes (Signed)
 Assume Care - Medical Decision Making  Care of patient assumed from previous emergency medicine provider. See their note for further details of history, physical exam and plan.  Briefly, Ashley Freeman is a 33 y.o. female who presents as below:  Clinical Course as of 09/09/23 2053  Mon Sep 09, 2023  2049 She reported the emergency department for diffuse body pain and abdominal pain when she missed treatments.; Reassess after HD [WC]    Clinical Course User Index [WC] Arminda Landmark, MD    Reassessment:  I personally reassessed the patient: Vital Signs:  The most current vitals were:  Vitals:   09/09/23 1814 09/09/23 1821  BP: (!) 163/96 (!) 163/94  Pulse: 80 85  Resp: 17 14  Temp:  98.2 F (36.8 C)  SpO2: 97% 97%     Hemodynamics:  The patient is hemodynamically stable. Mental Status:  The patient is alert   Additional MDM/ED Course:  She went to dialysis and feels much better afterwards.  Abdominal exam is reassuring.  There is no overt signs of peritonitis.  There is no abdominal distention.  Review of laboratory studies reveals a troponin that is downtrending.  There is no overt concerns on her laboratory studies.  Therefore given that she is feeling better after dialysis I do feel that she is stable for discharge.   Arminda Landmark, MD 09/09/23 1610    Deatra Face, MD 09/10/23 1710

## 2023-09-09 NOTE — ED Provider Notes (Signed)
 Care assumed at signout.  Patient with end-stage renal disease, recurrent ascites.  10:10 AM Patient with troponin substantially decreased from prior, no evidence for DKA, no lactic acidosis, mild hyperglycemia, no anion gap.  With mild hyperkalemia, evidence for fluid overload status, patient will go to dialysis.   Dorenda Gandy, MD 09/09/23 1010

## 2023-09-09 NOTE — Consult Note (Signed)
 Renal Service Consult Note [facility]  Ashley Freeman 09/09/2023 Ashley Sandifer, MD Requesting Physician: Dr. Liam Redhead  Reason for Consult: ESRD patient with abdominal pain HPI: The patient is a 33 y.o. year-old w/ PMH as below who presented to ED this morning complaining of diffuse abdominal pain and pressure.  No nausea vomiting or diarrhea.  This has been going on for hours.  Patient is on TTS dialysis, last dialysis was Thursday, she missed Saturday dialysis.  Patient had a recent cholecystectomy in early March.  In ED blood pressure 160/100, HR 78, RR 12-29, temp 98.3.  O2 sats 88 to 97% on nasal cannula oxygen.  BUN 46, creatinine 7, potassium 5.2.  BBC 8.2.  Chest x-ray consistent with interstitial pulmonary edema.  Patient is to be admitted, we are asked to see for dialysis.   Pt seen in ED hallway. Main c/o is abd pain. Denies any SOB. On [place] O2.  She lives with her mother and takes Medicare rides to get to dialysis.   ROS - denies CP, no joint pain, no HA, no blurry vision, no rash, no diarrhea, no nausea/ vomiting  PMH: DM type I ESRD on HD DKA Bipolar disorder Anemia HFrEF HL HTN Sepsis  Past Surgical History  Past Surgical History:  Procedure Laterality Date   A/V FISTULAGRAM Right 06/17/2023   Procedure: A/V Fistulagram;  Surgeon: Celso College, MD;  Location: [facility] INVASIVE CV LAB;  Service: Cardiovascular;  Laterality: Right;   AV FISTULA PLACEMENT Right 07/06/2022   Procedure: ARTERIOVENOUS GRAFT CREATION;  Surgeon: Young Hensen, MD;  Location: [facility] OR;  Service: Vascular;  Laterality: Right;   CESAREAN SECTION N/A 10/05/2019   Procedure: CESAREAN SECTION;  Surgeon: Raynell Caller, MD;  Location: [facility] LD ORS;  Service: Obstetrics;  Laterality: N/A;   CHOLECYSTECTOMY N/A 07/02/2023   Procedure: LAPAROSCOPIC CHOLECYSTECTOMY;  Surgeon: Enid Harry, MD;  Location: [facility] OR;  Service: General;  Laterality: N/A;   INCISION AND  DRAINAGE ABSCESS Left 09/28/2019   Procedure: INCISION AND DRAINAGE VULVAR ABCESS;  Surgeon: Albino Hum, MD;  Location: [facility] OR;  Service: Gynecology;  Laterality: Left;   INCISION AND DRAINAGE PERIRECTAL ABSCESS Right 08/18/2013   Procedure: IRRIGATION AND DEBRIDEMENT GLUTEAL ABSCESS;  Surgeon: Shela Derby, MD;  Location: [facility] OR;  Service: General;  Laterality: Right;   INCISION AND DRAINAGE PERIRECTAL ABSCESS Right 09/19/2013   Procedure: IRRIGATION AND DEBRIDEMENT RIGHT GLUTEAL AND LABIAL ABSCESSES;  Surgeon: Shela Derby, MD;  Location: [facility] OR;  Service: General;  Laterality: Right;   INCISION AND DRAINAGE PERIRECTAL ABSCESS Right 09/24/2013   Procedure: IRRIGATION AND DEBRIDEMENT PERIRECTAL ABSCESS;  Surgeon: Diantha Fossa, MD;  Location: [facility] OR;  Service: General;  Laterality: Right;   IR PARACENTESIS  08/28/2023   Family History  Family History  Problem Relation Age of Onset   Asthma Mother    Carpal tunnel syndrome Mother    Gout Father    Diabetes Paternal Grandmother    Anesthesia problems Neg Hx    Social History  reports that she has never smoked. She has never been exposed to tobacco smoke. She has never used smokeless tobacco. She reports that she does not currently use alcohol. She reports that she does not use drugs. Allergies  Allergies  Allergen Reactions   Cephalexin  Anaphylaxis    Has gotten ceftriaxone  in the past    Morphine  Itching   Penicillins Hives and Rash   Fish Allergy    Benadryl  Averil.Black ] Itching  Dilaudid  [Hydromorphone ] Itching   Doxycycline  Itching   Methotrexate Derivatives Rash   Oxycodone  Itching   Home medications Prior to Admission medications   Medication Sig Start Date End Date Taking? Authorizing Provider  acetaminophen  (TYLENOL ) 500 MG tablet Take 1,000 mg by mouth as needed for mild pain (pain score 1-3) or moderate pain (pain score 4-6).    [provider]  albuterol  (VENTOLIN  HFA) 108 (90 Base) MCG/ACT  inhaler Inhale 2 puffs into the lungs every 4 (four) hours as needed for wheezing or shortness of breath. 11/24/22   [provider]  bumetanide (BUMEX) 2 MG tablet Take 2 mg by mouth daily.    [provider]  calcitRIOL  (ROCALTROL ) 0.25 MCG capsule Take 5 capsules (1.25 mcg total) by mouth every Tuesday, Thursday, and Saturday at 6 PM. 07/09/23   Oral Billings, MD  carvedilol  (COREG ) 25 MG tablet Take 25 mg by mouth 2 (two) times daily with a meal. 05/15/23   [provider]  Continuous Glucose Sensor (DEXCOM G7 SENSOR) MISC Change sensors every 10 days 09/07/22   Shamleffer, Ibtehal Jaralla, MD  cyclobenzaprine  (FLEXERIL ) 5 MG tablet Take 1 tablet (5 mg total) by mouth 3 (three) times daily as needed for muscle spasms. 08/31/23   Ghimire, Estil Heman, MD  dicyclomine  (BENTYL ) 20 MG tablet Take 1 tablet (20 mg total) by mouth 2 (two) times daily for 10 days. 08/31/23 09/10/23  Ghimire, Estil Heman, MD  DULoxetine  (CYMBALTA ) 20 MG capsule Take 20 mg by mouth daily. 07/29/23   [provider]  famotidine  (PEPCID ) 20 MG tablet Take 20 mg by mouth 2 (two) times daily. 07/29/23   [provider]  HYDROcodone -acetaminophen  (NORCO/VICODIN) 5-325 MG tablet Take 1 tablet by mouth every 6 (six) hours as needed for moderate pain (pain score 4-6). 08/31/23   Ghimire, Estil Heman, MD  hydrocortisone  2.5 % cream Apply 1 Application topically daily as needed. 12/28/22 12/28/23  [provider]  hydrOXYzine  (ATARAX ) 25 MG tablet Take 25 mg by mouth every 6 (six) hours as needed for anxiety or itching. 05/14/23   [provider]  insulin  aspart (NOVOLOG ) 100 UNIT/ML injection Inject 5 Units into the skin 3 (three) times daily as needed for high blood sugar.    [provider]  lamoTRIgine  (LAMICTAL ) 200 MG tablet Take 200 mg by mouth daily. 07/11/23   [provider]  LANTUS  SOLOSTAR 100 UNIT/ML Solostar Pen Inject 10 Units into the skin at bedtime.     [provider]  LORazepam  (ATIVAN ) 1 MG tablet Take 1 mg by mouth daily as needed. 07/11/23   [provider]  losartan  (COZAAR ) 100 MG tablet Take 150 mg by mouth daily. 03/25/23   [provider]  methylphenidate 18 MG PO CR tablet Take 18 mg by mouth every morning. 08/21/23   [provider]  ondansetron  (ZOFRAN -ODT) 4 MG disintegrating tablet Take 1 tablet (4 mg total) by mouth every 8 (eight) hours as needed for nausea or vomiting. 06/23/23   Davis, Jonathon H, MD  polyethylene glycol (MIRALAX  / GLYCOLAX ) 17 g packet Take 17 g by mouth daily as needed for mild constipation. 12/07/22   [provider]  rosuvastatin  (CRESTOR ) 40 MG tablet Take 40 mg by mouth daily. 05/22/22   [provider]  sevelamer  carbonate (RENVELA ) 800 MG tablet Take 2,400 mg by mouth 3 (three) times daily with meals.    [provider]  sodium bicarbonate  650 MG tablet Take 1,300 mg by  mouth 2 (two) times daily. 07/29/23   [provider]  SUMAtriptan  (IMITREX ) 50 MG tablet Take 50 mg by mouth every 2 (two) hours as needed for migraine or headache. 06/20/22   [provider]     Vitals:   09/09/23 0630 09/09/23 0645 09/09/23 1000 09/09/23 1104  BP: (!) 161/104 (!) 145/92 137/80   Pulse: 79 78 78   Resp: (!) 24 12 (!) 21   Temp:    98 F (36.7 C)  TempSrc:    Oral  SpO2: 100% 97% 95%   Weight:      Height:       Exam Gen alert, no distress, looks uncomfortable with abdominal pain No rash, cyanosis or gangrene Sclera anicteric, throat clear  No jvd or bruits Chest clear bilat to bases, no rales/ wheezing RRR no MRG Abd soft ntnd no mass or ascites +bs GU defer MS no joint effusions or deformity Ext no LE or UE edema, no other edema Neuro is alert, Ox 3 , nf    Right upper arm AV graft positive bruit      Renal-related home meds: Coreg  25 twice daily Losartan  150 daily Renvela  3 AC 3 times daily    OP HD: TTS [facility]  [address] [facility]  From 08/14/23 --> 3h   B350  57.5kg  R AVG  Heparin  1600 + 600u/hr    Assessment/ Plan: Abd pain: per pmd Hypoxia/ possible pulm edema: CXR read as pulm edema. Pt is not in distress, no LE edema. On [place] O2 here ( not on at home O2). Max UF w/ HD today.  ESRD: on HD TTS. Missed Sat HD this weekend. Plan HD today/ tonight.  HTN: bp's up a little. Follow.  Volume: not grossly overloaded, CXR suspicious for some borderline pulm edema. Plan 2-3 L UF w/ HD.  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  Secondary hyperparathyroidism: CCa in range, cont binders ac tid.    Larry Poag  MD [facility] 09/09/2023, 12:15 PM  Recent Labs  Lab 09/09/23 0620  HGB 11.7*  ALBUMIN 3.1*  CALCIUM  8.6*  CREATININE 7.04*  K 5.2*   Inpatient medications:

## 2023-09-09 NOTE — Discharge Instructions (Addendum)
 Ashley Freeman:  Thank you for allowing us  to take care of you today.  We hope you begin feeling better soon. You were seen today for abdominal pain likely related to distention from your ascites.  You were taken to dialysis and 3 L of fluid were removed.  You stated that you felt much better on my assessment after dialysis.  Therefore we feel you can be discharged.  Please go to dialysis on Tuesday.  To-Do:  Please follow-up with your primary doctor within the next 2-3 days. It is important that you review any labs or imaging results (if any) that you had today with them. Your preliminary imaging results (if any) are attached. Please return to the Emergency Department or call 911 if you experience chest pain, shortness of breath, severe pain, severe fever, altered mental status, or have any reason to think that you need emergency medical care.  Thank you again.  Hope you feel better soon.  Arminda Landmark, MD Department of Emergency Medicine

## 2023-09-09 NOTE — Progress Notes (Signed)
   09/09/23 1821  Vitals  Temp 98.2 F (36.8 C)  Pulse Rate 85  Resp 14  BP (!) 163/94  SpO2 97 %  O2 Device Room Air  Oxygen Therapy  Patient Activity (if Appropriate) In bed  Pulse Oximetry Type Continuous  Oximetry Probe Site Changed No  Post Treatment  Dialyzer Clearance Lightly streaked  Hemodialysis Intake (mL) 0 mL  Liters Processed 83.9  Fluid Removed (mL) 3000 mL  Tolerated HD Treatment Yes  AVG/AVF Arterial Site Held (minutes) 10 minutes  AVG/AVF Venous Site Held (minutes) 10 minutes   Received patient in bed to unit.  Alert and oriented.  Informed consent signed and in chart.   TX duration:3.5  Patient tolerated well.  Transported back to the room  Alert, without acute distress.  Hand-off given to patient's nurse.   Access used: RUAF Access issues: no complications  Total UF removed: 3000 Medication(s) given: none   [facility]

## 2023-09-09 NOTE — ED Notes (Signed)
 Discharge instructions reviewed with patient. Patient questions answered and opportunity for education reviewed. Patient voices understanding of discharge instructions with no further questions. Patient ambulatory with steady gait to lobby.

## 2023-09-09 NOTE — ED Triage Notes (Signed)
 Patient BIB EMS from home. Patient c/o abdominal pain, diffuse, says it feels like pressure, onset 4 hours ago. Patient denies N/V/D. Patient ESRD, last dialysis was Saturday, has not missed any treatments.  BGL [id number]

## 2023-09-09 NOTE — ED Notes (Signed)
Placed pt on bed pan.

## 2023-09-09 NOTE — ED Provider Notes (Signed)
 [place] EMERGENCY DEPARTMENT AT [facility] Provider Note   CSN: [id number] Arrival date & time: 09/09/23  5284     History  Chief Complaint  Patient presents with   Abdominal Pain    Ashley Freeman is a 33 y.o. female.   33 y.o.  female with history of ESRD on HD TTS, chronic HFrEF, DM-1, s/p recent cholecystectomy on 3/4-who presented with abdominal pain/distention and discharged on 5/3.  Returns with diffuse body pain and abdominal pain all over for the past 4 hours.  This feels similar to "when I missed treatments".  Her last dialysis session was Thursday, May 8 and she missed 1 treatment.  States she has had increased abdominal distention, abdominal pain, pressure for the past several hours.  Did have a paracentesis last week for the first time.  Denies fevers, chills, nausea, vomiting, diarrhea, constipation.  Does make some urine.  No vaginal bleeding or discharge.  States her sugars have been in the 400s.  Previous cholecystectomy in March.  Still has appendix.  Denies any chest pain or difficulty breathing.  The history is provided by the patient.  Abdominal Pain Associated symptoms: no chest pain, no cough, no dysuria, no fever, no nausea, no shortness of breath and no vomiting        Home Medications Prior to Admission medications   Medication Sig Start Date End Date Taking? Authorizing Provider  acetaminophen  (TYLENOL ) 500 MG tablet Take 1,000 mg by mouth as needed for mild pain (pain score 1-3) or moderate pain (pain score 4-6).    [provider]  albuterol  (VENTOLIN  HFA) 108 (90 Base) MCG/ACT inhaler Inhale 2 puffs into the lungs every 4 (four) hours as needed for wheezing or shortness of breath. 11/24/22   [provider]  bumetanide (BUMEX) 2 MG tablet Take 2 mg by mouth daily.    [provider]  calcitRIOL  (ROCALTROL ) 0.25 MCG capsule Take 5 capsules (1.25 mcg total) by mouth every Tuesday, Thursday, and Saturday  at 6 PM. 07/09/23   Oral Billings, MD  carvedilol  (COREG ) 25 MG tablet Take 25 mg by mouth 2 (two) times daily with a meal. 05/15/23   [provider]  Continuous Glucose Sensor (DEXCOM G7 SENSOR) MISC Change sensors every 10 days 09/07/22   Shamleffer, Ibtehal Jaralla, MD  cyclobenzaprine  (FLEXERIL ) 5 MG tablet Take 1 tablet (5 mg total) by mouth 3 (three) times daily as needed for muscle spasms. 08/31/23   Ghimire, Estil Heman, MD  dicyclomine  (BENTYL ) 20 MG tablet Take 1 tablet (20 mg total) by mouth 2 (two) times daily for 10 days. 08/31/23 09/10/23  Ghimire, Estil Heman, MD  DULoxetine  (CYMBALTA ) 20 MG capsule Take 20 mg by mouth daily. 07/29/23   [provider]  famotidine  (PEPCID ) 20 MG tablet Take 20 mg by mouth 2 (two) times daily. 07/29/23   [provider]  HYDROcodone -acetaminophen  (NORCO/VICODIN) 5-325 MG tablet Take 1 tablet by mouth every 6 (six) hours as needed for moderate pain (pain score 4-6). 08/31/23   Ghimire, Estil Heman, MD  hydrocortisone  2.5 % cream Apply 1 Application topically daily as needed. 12/28/22 12/28/23  [provider]  hydrOXYzine  (ATARAX ) 25 MG tablet Take 25 mg by mouth every 6 (six) hours as needed for anxiety or itching. 05/14/23   [provider]  insulin  aspart (NOVOLOG ) 100 UNIT/ML injection Inject 5 Units into the skin 3 (three) times daily as needed for high blood sugar.    [provider]  lamoTRIgine  (LAMICTAL ) 200 MG tablet Take 200 mg by mouth daily. 07/11/23   [provider]  LANTUS  SOLOSTAR 100 UNIT/ML Solostar Pen Inject 10 Units into the skin at bedtime.    [provider]  LORazepam  (ATIVAN ) 1 MG tablet Take 1 mg by mouth daily as needed. 07/11/23   [provider]  losartan  (COZAAR ) 100 MG tablet Take 150 mg by mouth daily. 03/25/23   [provider]  methylphenidate 18 MG PO CR tablet Take 18 mg by mouth every morning. 08/21/23   [provider]  ondansetron   (ZOFRAN -ODT) 4 MG disintegrating tablet Take 1 tablet (4 mg total) by mouth every 8 (eight) hours as needed for nausea or vomiting. 06/23/23   Davis, Jonathon H, MD  polyethylene glycol (MIRALAX  / GLYCOLAX ) 17 g packet Take 17 g by mouth daily as needed for mild constipation. 12/07/22   [provider]  rosuvastatin  (CRESTOR ) 40 MG tablet Take 40 mg by mouth daily. 05/22/22   [provider]  sevelamer  carbonate (RENVELA ) 800 MG tablet Take 2,400 mg by mouth 3 (three) times daily with meals.    [provider]  sodium bicarbonate  650 MG tablet Take 1,300 mg by mouth 2 (two) times daily. 07/29/23   [provider]  SUMAtriptan  (IMITREX ) 50 MG tablet Take 50 mg by mouth every 2 (two) hours as needed for migraine or headache. 06/20/22   [provider]      Allergies    Cephalexin , Morphine , Penicillins, Fish allergy, Benadryl  [diphenhydramine ], Dilaudid  [hydromorphone ], Doxycycline , Methotrexate derivatives, and Oxycodone     Review of Systems   Review of Systems  Constitutional:  Negative for activity change, appetite change and fever.  HENT:  Negative for congestion and rhinorrhea.   Respiratory:  Negative for cough, chest tightness and shortness of breath.   Cardiovascular:  Negative for chest pain.  Gastrointestinal:  Positive for abdominal pain. Negative for nausea and vomiting.  Genitourinary:  Negative for dysuria.  Musculoskeletal:  Negative for myalgias.  Skin:  Negative for rash.  Neurological:  Negative for dizziness, weakness and headaches.   all other systems are negative except as noted in the HPI and PMH.    Physical Exam Updated Vital Signs BP (!) 156/99   Pulse 79   Temp 98.3 F (36.8 C) (Oral)   Resp (!) 23   Ht 5\' 3"  (1.6 m)   Wt 61 kg   SpO2 97%   BMI 23.82 kg/m  Physical Exam Vitals and nursing note reviewed.  Constitutional:      General: She is not in acute distress.    Appearance: She is well-developed. She is not  ill-appearing.  HENT:     Head: Normocephalic and atraumatic.     Mouth/Throat:     Pharynx: No oropharyngeal exudate.  Eyes:     Conjunctiva/sclera: Conjunctivae normal.     Pupils: Pupils are equal, round, and reactive to light.  Neck:     Comments: No meningismus. Cardiovascular:     Rate and Rhythm: Normal rate and regular rhythm.     Heart sounds: Normal heart sounds. No murmur heard. Pulmonary:     Effort: Pulmonary effort is normal. No respiratory distress.     Breath sounds: Normal breath sounds.  Abdominal:     General: There is distension.     Palpations: Abdomen is soft.     Tenderness: There is abdominal tenderness. There is no guarding or rebound.     Comments: Diffusely tender with voluntary guarding  Musculoskeletal:        General: No tenderness. Normal range of motion.     Cervical back: Normal range of motion and neck supple.     Comments: No CVAT  Skin:    General: Skin is warm.  Neurological:     Mental Status: She is alert and oriented to person, place, and time.     Cranial Nerves: No cranial nerve deficit.     Motor: No abnormal muscle tone.     Coordination: Coordination normal.     Comments:  5/5 strength throughout. CN 2-12 intact.Equal grip strength.   Psychiatric:        Behavior: Behavior normal.     ED Results / Procedures / Treatments   Labs (all labs ordered are listed, but only abnormal results are displayed) Labs Reviewed  CBC WITH DIFFERENTIAL/PLATELET - Abnormal; Notable for the following components:      Result Value   Hemoglobin 11.7 (*)    RDW 15.6 (*)    Eosinophils Absolute 0.8 (*)    All other components within normal limits  CBG MONITORING, ED - Abnormal; Notable for the following components:   Glucose-Capillary 377 (*)    All other components within normal limits  RESP PANEL BY RT-PCR (RSV, FLU A&B, COVID)  [id number]  LACTIC ACID, PLASMA  COMPREHENSIVE METABOLIC PANEL WITH GFR  URINALYSIS, ROUTINE W REFLEX MICROSCOPIC   LACTIC ACID, PLASMA  BETA-HYDROXYBUTYRIC ACID  LIPASE, BLOOD  BRAIN NATRIURETIC PEPTIDE  TROPONIN I (HIGH SENSITIVITY)    EKG EKG Interpretation Date/Time:  Monday Sep 09 2023 05:26:40 EDT Ventricular Rate:  80 PR Interval:  139 QRS Duration:  106 QT Interval:  435 QTC Calculation: 502 R Axis:   41  Text Interpretation: Sinus rhythm Borderline T wave abnormalities Prolonged QT interval No significant change was found Confirmed by Earma Gloss [phone]) on 09/09/2023 6:50:02 AM  Radiology No results found.  Procedures Procedures    Medications Ordered in ED Medications  fentaNYL  (SUBLIMAZE ) injection 50 mcg (has no administration in time range)  ondansetron  (ZOFRAN ) injection 4 mg (has no administration in time range)    ED Course/ Medical Decision Making/ A&P                                 Medical Decision Making Amount and/or Complexity of Data Reviewed Labs: ordered. Decision-making details documented in ED Course. Radiology: ordered and independent interpretation performed. Decision-making details documented in ED Course. ECG/medicine tests: ordered and independent interpretation performed. Decision-making details documented in ED Course.  Risk Prescription drug management.   ESRD with diabetes presents with abdominal pain and pain all over.  Stable vitals.  Abdomen soft without peritoneal signs.   Will hydrate.  Give pain and nausea medications.  Patient states she could not tolerate Dilaudid  or morphine  despite allergy if she was given Atarax .  Does not want fentanyl .  Did have 1 missed dialysis session over the weekend.  Denies any difficulty breathing or chest pain.  EKG without acute hyperkalemic changes.  Will obtain labs to rule out DKA.  May require paracentesis for ascites.  Did have recent cholecystectomy in March. Labs pending to rule out DKA or other electrolyte abnormality.  Plain film x-ray obtained of abdomen and pelvis. May benefit from  repeat paracentesis and possible admission for dialysis.  Care to be transferred at shift change.       Final Clinical Impression(s) / ED Diagnoses Final  diagnoses:  None    Rx / DC Orders ED Discharge Orders     None         Sudais Banghart, Mara Semin[place], MD 09/09/23 [phone]

## 2023-09-09 NOTE — Inpatient Diabetes Management (Signed)
 Inpatient Diabetes Program Recommendations  AACE/ADA: New Consensus Statement on Inpatient Glycemic Control (2015)  Target Ranges:  Prepandial:   less than 140 mg/dL      Peak postprandial:   less than 180 mg/dL (1-2 hours)      Critically ill patients:  140 - 180 mg/dL   Lab Results  Component Value Date   GLUCAP 377 (H) 09/09/2023   HGBA1C 10.3 (H) 08/27/2023    Review of Glycemic Control  Diabetes history: DM type 1 Outpatient Diabetes medications: Novolog  5 units tid, Lantus  10 units qhs Current orders for Inpatient glycemic control:  None being evaluated in the ED  A1c 10.3 on 4/29 Addressed by Diabetes coordinator on 4/30  Inpatient Diabetes Program Recommendations:    -   Consider Semglee  10 units Q24 hours -   Novolog  0-6 units tid + hs  Pt very sensitive to insulin  and cannot tolerate large doses of Novolog  due to renal function.  Thanks,  Eloise Hake RN, MSN, BC-ADM Inpatient Diabetes Coordinator Team Pager [phone] (8a-5p)

## 2023-09-10 ENCOUNTER — Ambulatory Visit (HOSPITAL_COMMUNITY)
Admission: EM | Admit: 2023-09-10 | Discharge: 2023-09-10 | Disposition: A | Discharge status: Home / Self Care | Attending: Family Medicine | Admitting: Family Medicine

## 2023-09-10 ENCOUNTER — Encounter (HOSPITAL_COMMUNITY): Payer: Self-pay

## 2023-09-10 DIAGNOSIS — R21 Rash and other nonspecific skin eruption: Secondary | ICD-10-CM | POA: Diagnosis not present

## 2023-09-10 MED ORDER — PREDNISONE 20 MG PO TABS: 40.0000 mg | ORAL_TABLET | Freq: Every day | ORAL | 0 refills | Status: DC

## 2023-09-10 NOTE — ED Provider Notes (Signed)
 [facility]   [id number] 09/10/23 Arrival Time: 1549  ASSESSMENT & PLAN:  1. Rash and nonspecific skin eruption    Trial of: Meds ordered this encounter  Medications   predniSONE (DELTASONE) 20 MG tablet    Sig: Take 2 tablets (40 mg total) by mouth daily.    Dispense:  10 tablet    Refill:  0    Follow-up Information     [name], [name], MD.   Specialty: Family Medicine Why: If worsening or failing to improve as anticipated. Contact information: [address] [place] [address] [phone]                 Will follow up with PCP or here if worsening or failing to improve as anticipated. Reviewed expectations re: course of current medical issues. Questions answered. Outlined signs and symptoms indicating need for more acute intervention. Patient verbalized understanding. After Visit Summary given.   SUBJECTIVE:  Ashley Freeman is a 33 y.o. female who presents with a skin complaint. Itchy rash over body after recent d/c from hospital. Reports that she received Dilauded and has had previous rash after this. "Only thing I can think of". Itching is bothering her the most. Denies fever.   OBJECTIVE: Vitals:   09/10/23 1656  BP: (!) 165/96  Pulse: 81  Resp: 18  Temp: 98.5 F (36.9 C)  TempSrc: Oral  SpO2: 96%    General appearance: alert; no distress HEENT: [place]; AT Neck: supple with FROM Lungs: clear to auscultation bilaterally Heart: regular Extremities: no edema; moves all extremities normally Skin: warm and dry; diffuse maculopapular rash from neck down; few areas of excoriation; no signs of skin infection Psychological: alert and cooperative; normal mood and affect  Allergies  Allergen Reactions   Cephalexin  Anaphylaxis    Has gotten ceftriaxone  in the past    Morphine  Itching   Penicillins Hives and Rash   Fish Allergy    Benadryl  [Diphenhydramine ] Itching   Dilaudid  [Hydromorphone ] Itching   Doxycycline  Itching    Methotrexate Derivatives Rash   Oxycodone  Itching    Past Medical History:  Diagnosis Date   Abscess, gluteal, right 08/24/2013   Anemia 02/19/2012   Bartholin's gland abscess 09/19/2013   Bipolar disorder (HCC)    BV (bacterial vaginosis) 11/24/2015   Depression    Diabetes mellitus type I (HCC) 2001   Diagnosed at age 78 ; Type I   Diarrhea 05/30/2016   DKA (diabetic ketoacidoses) 08/19/2013   Also in 2018   ESRD (end stage renal disease) (HCC)    Gonorrhea 08/2011   Treated in 09/2011   HFrEF (heart failure with reduced ejection fraction) (HCC)    a. 2022 Echo: EF 40%; b. 10/2021 Echo: EF 55%; b. 07/2022 MV: No ischemia. EF 31%; c. 08/2022 Echo: EF 35%, mildly dil RV, sev TR.   History of trichomoniasis 05/31/2016   Hyperlipidemia 03/28/2016   Hypertension    NICM (nonischemic cardiomyopathy) (HCC)    Sepsis (HCC) 09/19/2013   Social History   Socioeconomic History   Marital status: Single    Spouse name: Not on file   Number of children: 0   Years of education: 11th grade   Highest education level: Not on file  Occupational History   Occupation: unemployed    Comment: has never worked  Tobacco Use   Smoking status: Never    Passive exposure: Never   Smokeless tobacco: Never  Vaping Use   Vaping status: Never Used  Substance  and Sexual Activity   Alcohol use: Not Currently   Drug use: No   Sexual activity: Yes    Birth control/protection: None  Other Topics Concern   Not on file  Social History Narrative   Patient lives in [place] mother lives in [place].  Unemployed.  Previously worked for a Customer service manager.  Completed 11 grade working on [employer]. Patient 3 brothers    Social Drivers of Corporate investment banker Strain: High Risk (11/22/2022)   Received from [facility]   Overall Financial Resource Strain (CARDIA)    Difficulty of Paying Living Expenses: Hard  Food Insecurity: No Food Insecurity (08/28/2023)   Hunger Vital Sign    Worried  About Running Out of Food in the Last Year: Never true    Ran Out of Food in the Last Year: Never true  Transportation Needs: No Transportation Needs (08/28/2023)   PRAPARE - Administrator, Civil Service (Medical): No    Lack of Transportation (Non-Medical): No  Physical Activity: Insufficiently Active (03/02/2022)   Received from [facility]   Exercise Vital Sign    Days of Exercise per Week: 2 days    Minutes of Exercise per Session: 10 min  Stress: No Stress Concern Present (03/08/2023)   Received from [facility] of Occupational Health - Occupational Stress Questionnaire    Feeling of Stress : Not at all  Social Connections: Moderately Integrated (08/28/2023)   Social Connection and Isolation Panel [NHANES]    Frequency of Communication with Friends and Family: More than three times a week    Frequency of Social Gatherings with Friends and Family: Once a week    Attends Religious Services: More than 4 times per year    Active Member of [employer] or Organizations: Yes    Attends Banker Meetings: Never    Marital Status: Never married  Intimate Partner Violence: Not At Risk (08/28/2023)   Humiliation, Afraid, Rape, and Kick questionnaire    Fear of Current or Ex-Partner: No    Emotionally Abused: No    Physically Abused: No    Sexually Abused: No   Family History  Problem Relation Age of Onset   Asthma Mother    Carpal tunnel syndrome Mother    Gout Father    Diabetes Paternal Grandmother    Anesthesia problems Neg Hx    Past Surgical History:  Procedure Laterality Date   A/V FISTULAGRAM Right 06/17/2023   Procedure: A/V Fistulagram;  Surgeon: Celso College, MD;  Location: [facility] INVASIVE CV LAB;  Service: Cardiovascular;  Laterality: Right;   AV FISTULA PLACEMENT Right 07/06/2022   Procedure: ARTERIOVENOUS GRAFT CREATION;  Surgeon: Young Hensen, MD;  Location: [facility] OR;  Service: Vascular;  Laterality: Right;    CESAREAN SECTION N/A 10/05/2019   Procedure: CESAREAN SECTION;  Surgeon: Raynell Caller, MD;  Location: [facility] LD ORS;  Service: Obstetrics;  Laterality: N/A;   CHOLECYSTECTOMY N/A 07/02/2023   Procedure: LAPAROSCOPIC CHOLECYSTECTOMY;  Surgeon: Enid Harry, MD;  Location: [facility] OR;  Service: General;  Laterality: N/A;   INCISION AND DRAINAGE ABSCESS Left 09/28/2019   Procedure: INCISION AND DRAINAGE VULVAR ABCESS;  Surgeon: Albino Hum, MD;  Location[facility] OR;  Service: Gynecology;  Laterality: Left;   INCISION AND DRAINAGE PERIRECTAL ABSCESS Right 08/18/2013   Procedure: IRRIGATION AND DEBRIDEMENT GLUTEAL ABSCESS;  Surgeon: Shela Derby, MD;  Location: [facility] OR;  Service: General;  Laterality: Right;   INCISION AND  DRAINAGE PERIRECTAL ABSCESS Right 09/19/2013   Procedure: IRRIGATION AND DEBRIDEMENT RIGHT GLUTEAL AND LABIAL ABSCESSES;  Surgeon: Shela Derby, MD;  Location: [facility] OR;  Service: General;  Laterality: Right;   INCISION AND DRAINAGE PERIRECTAL ABSCESS Right 09/24/2013   Procedure: IRRIGATION AND DEBRIDEMENT PERIRECTAL ABSCESS;  Surgeon: Diantha Fossa, MD;  Location: [facility] OR;  Service: General;  Laterality: Right;   IR PARACENTESIS  08/28/2023      Afton Albright, MD 09/10/23 1758

## 2023-09-10 NOTE — ED Triage Notes (Signed)
 Pt c/o itchy rash all over x1wk. Denies new/change products. States she is allergic to dilaudid  and they gave it to her when she was in the hospital. States using hydrocortisone  cream with no relief.

## 2023-09-10 NOTE — Discharge Instructions (Signed)
 Be aware, the medicine prescribed will cause your blood sugars to be higher than normal. Monitor closely.

## 2023-09-11 LAB — HEPATITIS B SURFACE ANTIBODY, QUANTITATIVE: Hep B S AB Quant (Post): <3.5 m[IU]/mL — ABNORMAL LOW

## 2023-09-17 ENCOUNTER — Encounter (INDEPENDENT_AMBULATORY_CARE_PROVIDER_SITE_OTHER): Payer: Self-pay

## 2023-09-25 ENCOUNTER — Ambulatory Visit (HOSPITAL_COMMUNITY): Admission: RE | Admit: 2023-09-25 | Source: Ambulatory Visit | Admitting: Nephrology

## 2023-09-25 ENCOUNTER — Encounter (HOSPITAL_COMMUNITY): Admission: RE | Payer: Self-pay | Source: Ambulatory Visit

## 2023-09-25 ENCOUNTER — Encounter (HOSPITAL_COMMUNITY)
Admission: RE | Disposition: A | Discharge status: Home / Self Care | Payer: Self-pay | Source: Ambulatory Visit | Attending: Vascular Surgery

## 2023-09-25 ENCOUNTER — Other Ambulatory Visit: Payer: Self-pay

## 2023-09-25 ENCOUNTER — Ambulatory Visit (HOSPITAL_COMMUNITY)
Admission: RE | Admit: 2023-09-25 | Discharge: 2023-09-25 | Disposition: A | Discharge status: Home / Self Care | Source: Ambulatory Visit | Attending: Vascular Surgery | Admitting: Vascular Surgery

## 2023-09-25 DIAGNOSIS — Z992 Dependence on renal dialysis: Secondary | ICD-10-CM

## 2023-09-25 DIAGNOSIS — N186 End stage renal disease: Secondary | ICD-10-CM

## 2023-09-25 DIAGNOSIS — T82858A Stenosis of vascular prosthetic devices, implants and grafts, initial encounter: Secondary | ICD-10-CM

## 2023-09-25 DIAGNOSIS — I132 Hypertensive heart and chronic kidney disease with heart failure and with stage 5 chronic kidney disease, or end stage renal disease: Secondary | ICD-10-CM | POA: Diagnosis not present

## 2023-09-25 DIAGNOSIS — X58XXXA Exposure to other specified factors, initial encounter: Secondary | ICD-10-CM | POA: Insufficient documentation

## 2023-09-25 DIAGNOSIS — Z794 Long term (current) use of insulin: Secondary | ICD-10-CM | POA: Insufficient documentation

## 2023-09-25 DIAGNOSIS — Y832 Surgical operation with anastomosis, bypass or graft as the cause of abnormal reaction of the patient, or of later complication, without mention of misadventure at the time of the procedure: Secondary | ICD-10-CM | POA: Diagnosis not present

## 2023-09-25 DIAGNOSIS — I5022 Chronic systolic (congestive) heart failure: Secondary | ICD-10-CM | POA: Insufficient documentation

## 2023-09-25 HISTORY — PX: A/V SHUNT INTERVENTION: CATH118220

## 2023-09-25 LAB — GLUCOSE, CAPILLARY: Glucose-Capillary: 305 mg/dL — ABNORMAL HIGH (ref 70–99)

## 2023-09-25 SURGERY — A/V SHUNT INTERVENTION
Anesthesia: LOCAL

## 2023-09-25 MED ORDER — IODIXANOL 320 MG/ML IV SOLN
INTRAVENOUS | Status: DC | PRN
  Administered 2023-09-25: 25 mL

## 2023-09-25 MED ORDER — LIDOCAINE HCL (PF) 1 % IJ SOLN
INTRAMUSCULAR | Status: AC
Start: 2023-09-25 — End: ?
  Filled 2023-09-25: qty 30

## 2023-09-25 MED ORDER — LIDOCAINE HCL (PF) 1 % IJ SOLN
INTRAMUSCULAR | Status: DC | PRN
  Administered 2023-09-25: 5 mL via SUBCUTANEOUS

## 2023-09-25 MED ORDER — HEPARIN (PORCINE) IN NACL 1000-0.9 UT/500ML-% IV SOLN
INTRAVENOUS | Status: DC | PRN
  Administered 2023-09-25: 500 mL

## 2023-09-25 NOTE — Op Note (Signed)
    Patient name: Ashley Freeman MRN: [id number] DOB: 1991/01/25 Sex: female  09/25/2023 Pre-operative Diagnosis: ESRD on HD, difficulty with dialysis sessions Post-operative diagnosis:  Same Surgeon:  Philipp Brawn, MD Procedure Performed:  Ultrasound-guided access of right arm AVG Fistulogram and central venogram Balloon angioplasty of outflow vein stenosis, 10 x 40 Mustang Balloon angioplasty of in-stent restenosis, 8 x 80 Mustang   Indications: 33 year old female with ESRD on HD who is having difficulty with dialysis sessions and necessitating multiple attempts at cannulation.  Recent benefits of fistulogram were reviewed, she expressed understanding and elected to proceed.  Findings:  Widely patent central venous system.  Approximate 50% stenosis of the outflow vein in the high brachial/axillary vein.  Approximate 50% in-stent restenosis at the proximal and distal stent edges within the previously placed stent in the graft.   Procedure:  The patient was identified in the holding area and taken to the cath lab  The patient was then placed supine on the table and prepped and draped in the usual sterile fashion.  A time out was called.  Ultrasound was used to evaluate the right arm AV access. This was accessed under u/s guidance. An 018 wire was advanced without resistance, a micropuncture sheath was placed and fistulagram obtained which demonstrated the above findings.  This access was then upsized to a 6 F short sheath over a glidewire.  A Glidewire was then used to cross the lesions.  These were then treated with a 10 mm x 40 mm Mustang balloon at the outflow vein stenosis and a 8 mm x 80 Mustang balloon throughout the inguinal entirety of the previous placed stent.  Completion angiogram demonstrated adequate response.  Contrast: 25 cc  Impression: Adequate response to balloon angioplasty of the in-stent restenosis.  The outflow vein stenosis appears to be a sclerotic  valve  Philipp Brawn MD [facility] Office: [phone]

## 2023-09-25 NOTE — H&P (Signed)
 HD ACCESS H&P   MRN #:  [id number]  History of Present Illness: This is a 33 y.o. female presenting for fistulogram due to difficulty with cannulation.  Most recent dialysis session was yesterday which went well but she reports did have multiple attempts at cannulation with one of the needles.  Past Medical History:  Diagnosis Date   Abscess, gluteal, right 08/24/2013   Anemia 02/19/2012   Bartholin's gland abscess 09/19/2013   Bipolar disorder (HCC)    BV (bacterial vaginosis) 11/24/2015   Depression    Diabetes mellitus type I (HCC) 2001   Diagnosed at age 89 ; Type I   Diarrhea 05/30/2016   DKA (diabetic ketoacidoses) 08/19/2013   Also in 2018   ESRD (end stage renal disease) (HCC)    Gonorrhea 08/2011   Treated in 09/2011   HFrEF (heart failure with reduced ejection fraction) (HCC)    a. 2022 Echo: EF 40%; b. 10/2021 Echo: EF 55%; b. 07/2022 MV: No ischemia. EF 31%; c. 08/2022 Echo: EF 35%, mildly dil RV, sev TR.   History of trichomoniasis 05/31/2016   Hyperlipidemia 03/28/2016   Hypertension    NICM (nonischemic cardiomyopathy) (HCC)    Sepsis (HCC) 09/19/2013    Past Surgical History:  Procedure Laterality Date   A/V FISTULAGRAM Right 06/17/2023   Procedure: A/V Fistulagram;  Surgeon: Celso College, MD;  Location: [facility] INVASIVE CV LAB;  Service: Cardiovascular;  Laterality: Right;   AV FISTULA PLACEMENT Right 07/06/2022   Procedure: ARTERIOVENOUS GRAFT CREATION;  Surgeon: Young Hensen, MD;  Location: [facility] OR;  Service: Vascular;  Laterality: Right;   CESAREAN SECTION N/A 10/05/2019   Procedure: CESAREAN SECTION;  Surgeon: Raynell Caller, MD;  Location: [facility] LD ORS;  Service: Obstetrics;  Laterality: N/A;   CHOLECYSTECTOMY N/A 07/02/2023   Procedure: LAPAROSCOPIC CHOLECYSTECTOMY;  Surgeon: Enid Harry, MD;  Location: [facility] OR;  Service: General;  Laterality: N/A;   INCISION AND DRAINAGE ABSCESS Left 09/28/2019   Procedure: INCISION AND DRAINAGE VULVAR ABCESS;   Surgeon: Albino Hum, MD;  Location: [facility] OR;  Service: Gynecology;  Laterality: Left;   INCISION AND DRAINAGE PERIRECTAL ABSCESS Right 08/18/2013   Procedure: IRRIGATION AND DEBRIDEMENT GLUTEAL ABSCESS;  Surgeon: Shela Derby, MD;  Location: [facility] OR;  Service: General;  Laterality: Right;   INCISION AND DRAINAGE PERIRECTAL ABSCESS Right 09/19/2013   Procedure: IRRIGATION AND DEBRIDEMENT RIGHT GLUTEAL AND LABIAL ABSCESSES;  Surgeon: Shela Derby, MD;  Location: [facility] OR;  Service: General;  Laterality: Right;   INCISION AND DRAINAGE PERIRECTAL ABSCESS Right 09/24/2013   Procedure: IRRIGATION AND DEBRIDEMENT PERIRECTAL ABSCESS;  Surgeon: Diantha Fossa, MD;  Location: [facility] OR;  Service: General;  Laterality: Right;   IR PARACENTESIS  08/28/2023    Allergies  Allergen Reactions   Cephalexin  Anaphylaxis    Has gotten ceftriaxone  in the past    Morphine  Itching   Penicillins Hives and Rash   Fish Allergy    Benadryl  [Diphenhydramine ] Itching   Dilaudid  [Hydromorphone ] Itching   Doxycycline  Itching   Methotrexate Derivatives Rash   Oxycodone  Itching    Prior to Admission medications   Medication Sig Start Date End Date Taking? Authorizing Provider  acetaminophen  (TYLENOL ) 500 MG tablet Take 1,000 mg by mouth as needed for mild pain (pain score 1-3) or moderate pain (pain score 4-6).    [provider]  albuterol  (VENTOLIN  HFA) 108 (90 Base) MCG/ACT inhaler Inhale 2 puffs into the lungs every 4 (four) hours as needed for wheezing or shortness of  breath. 11/24/22   [provider]  bumetanide (BUMEX) 2 MG tablet Take 2 mg by mouth daily.    [provider]  calcitRIOL  (ROCALTROL ) 0.25 MCG capsule Take 5 capsules (1.25 mcg total) by mouth every Tuesday, Thursday, and Saturday at 6 PM. 07/09/23   Oral Billings, MD  carvedilol  (COREG ) 25 MG tablet Take 25 mg by mouth 2 (two) times daily with a meal. 05/15/23   [provider]  Continuous Glucose Sensor (DEXCOM  G7 SENSOR) MISC Change sensors every 10 days 09/07/22   Shamleffer, Ibtehal Jaralla, MD  cyclobenzaprine  (FLEXERIL ) 5 MG tablet Take 1 tablet (5 mg total) by mouth 3 (three) times daily as needed for muscle spasms. 08/31/23   Ghimire, Estil Heman, MD  dicyclomine  (BENTYL ) 20 MG tablet Take 1 tablet (20 mg total) by mouth 2 (two) times daily for 10 days. 08/31/23 09/10/23  Ghimire, Estil Heman, MD  DULoxetine  (CYMBALTA ) 20 MG capsule Take 20 mg by mouth daily. 07/29/23   [provider]  famotidine  (PEPCID ) 20 MG tablet Take 20 mg by mouth 2 (two) times daily. 07/29/23   [provider]  HYDROcodone -acetaminophen  (NORCO/VICODIN) 5-325 MG tablet Take 1 tablet by mouth every 6 (six) hours as needed for moderate pain (pain score 4-6). 08/31/23   Ghimire, Estil Heman, MD  hydrocortisone  2.5 % cream Apply 1 Application topically daily as needed. 12/28/22 12/28/23  [provider]  hydrOXYzine  (ATARAX ) 25 MG tablet Take 25 mg by mouth every 6 (six) hours as needed for anxiety or itching. 05/14/23   [provider]  insulin  aspart (NOVOLOG ) 100 UNIT/ML injection Inject 5 Units into the skin 3 (three) times daily as needed for high blood sugar.    [provider]  lamoTRIgine  (LAMICTAL ) 200 MG tablet Take 200 mg by mouth daily. 07/11/23   [provider]  LANTUS  SOLOSTAR 100 UNIT/ML Solostar Pen Inject 10 Units into the skin at bedtime.    [provider]  LORazepam  (ATIVAN ) 1 MG tablet Take 1 mg by mouth daily as needed. 07/11/23   [provider]  losartan  (COZAAR ) 100 MG tablet Take 150 mg by mouth daily. 03/25/23   [provider]  methylphenidate 18 MG PO CR tablet Take 18 mg by mouth every morning. 08/21/23   [provider]  ondansetron  (ZOFRAN -ODT) 4 MG disintegrating tablet Take 1 tablet (4 mg total) by mouth every 8 (eight) hours as needed for nausea or vomiting. 06/23/23   Davis, Jonathon H, MD  polyethylene glycol (MIRALAX  /  GLYCOLAX ) 17 g packet Take 17 g by mouth daily as needed for mild constipation. 12/07/22   [provider]  predniSONE  (DELTASONE ) 20 MG tablet Take 2 tablets (40 mg total) by mouth daily. 09/10/23   Afton Albright, MD  rosuvastatin  (CRESTOR ) 40 MG tablet Take 40 mg by mouth daily. 05/22/22   [provider]  sevelamer  carbonate (RENVELA ) 800 MG tablet Take 2,400 mg by mouth 3 (three) times daily with meals.    [provider]  sodium bicarbonate  650 MG tablet Take 1,300 mg by mouth 2 (two) times daily. 07/29/23   [provider]  SUMAtriptan  (IMITREX ) 50 MG tablet Take 50 mg by mouth every 2 (two) hours as needed for migraine or headache. 06/20/22   [provider]    Social History   Socioeconomic History   Marital status: Single    Spouse name: Not on file   Number of children: 0   Years of education:  11th grade   Highest education level: Not on file  Occupational History   Occupation: unemployed    Comment: has never worked  Tobacco Use   Smoking status: Never    Passive exposure: Never   Smokeless tobacco: Never  Vaping Use   Vaping status: Never Used  Substance and Sexual Activity   Alcohol use: Not Currently   Drug use: No   Sexual activity: Yes    Birth control/protection: None  Other Topics Concern   Not on file  Social History Narrative   Patient lives in [place] mother lives in [place].  Unemployed.  Previously worked for a Customer service manager.  Completed 11 grade working on [employer]. Patient 3 brothers    Social Drivers of Corporate investment banker Strain: Low Risk  (09/18/2023)   Received from [facility]   Overall Financial Resource Strain (CARDIA)    Difficulty of Paying Living Expenses: Not very hard  Food Insecurity: No Food Insecurity (08/28/2023)   Hunger Vital Sign    Worried About Running Out of Food in the Last Year: Never true    Ran Out of Food in the Last Year: Never true  Transportation Needs: No  Transportation Needs (08/28/2023)   PRAPARE - Administrator, Civil Service (Medical): No    Lack of Transportation (Non-Medical): No  Physical Activity: Insufficiently Active (03/02/2022)   Received from [facility]   Exercise Vital Sign    Days of Exercise per Week: 2 days    Minutes of Exercise per Session: 10 min  Stress: No Stress Concern Present (03/08/2023)   Received from [facility] Occupational Health - Occupational Stress Questionnaire    Feeling of Stress : Not at all  Social Connections: Moderately Integrated (08/28/2023)   Social Connection and Isolation Panel [NHANES]    Frequency of Communication with Friends and Family: More than three times a week    Frequency of Social Gatherings with Friends and Family: Once a week    Attends Religious Services: More than 4 times per year    Active Member of [employer] or Organizations: Yes    Attends Banker Meetings: Never    Marital Status: Never married  Intimate Partner Violence: Not At Risk (08/28/2023)   Humiliation, Afraid, Rape, and Kick questionnaire    Fear of Current or Ex-Partner: No    Emotionally Abused: No    Physically Abused: No    Sexually Abused: No    Family History  Problem Relation Age of Onset   Asthma Mother    Carpal tunnel syndrome Mother    Gout Father    Diabetes Paternal Grandmother    Anesthesia problems Neg Hx     ROS: Otherwise negative unless mentioned in HPI  Physical Examination  Vitals:   09/25/23 1031  BP: 128/79  Pulse: 85  Resp: 16  Temp: 98.1 F (36.7 C)  SpO2: 97%   There is no height or weight on file to calculate BMI.  General: no acute distress Cardiac: hemodynamically stable Pulm: normal work of breathing Abdomen: non-tender, no pulsatile mass  Neuro: alert, no focal deficit Extremities: Pulsatile thrill in right upper extremity AVG   Data:   Reviewed the fistulogram from Dr. Vonna Guardian on  06/17/2023 Performed balloon angioplasty of an outflow stenosis in the proximal upper arm    ASSESSMENT/PLAN: This is a 33 y.o. female presenting for fistulogram due to difficulty with cannulation necessitating multiple attempts.  When cannulation is successful dialysis Rosal[facility] Comber without any issues, last dialysis session was yesterday.   - Risk and benefits of fistulogram were reviewed, she expressed understanding and elected to proceed.   Philipp Brawn MD [facility] [phone] 09/25/2023  10:53 AM

## 2023-09-26 ENCOUNTER — Encounter (HOSPITAL_COMMUNITY): Payer: Self-pay | Admitting: Vascular Surgery

## 2023-09-28 ENCOUNTER — Emergency Department (HOSPITAL_COMMUNITY)
Admission: EM | Admit: 2023-09-28 | Discharge: 2023-09-28 | Disposition: A | Discharge status: Home / Self Care | Attending: Emergency Medicine | Admitting: Emergency Medicine

## 2023-09-28 ENCOUNTER — Other Ambulatory Visit: Payer: Self-pay

## 2023-09-28 ENCOUNTER — Encounter (HOSPITAL_COMMUNITY): Payer: Self-pay

## 2023-09-28 DIAGNOSIS — Z992 Dependence on renal dialysis: Secondary | ICD-10-CM | POA: Insufficient documentation

## 2023-09-28 DIAGNOSIS — Z79899 Other long term (current) drug therapy: Secondary | ICD-10-CM | POA: Diagnosis not present

## 2023-09-28 DIAGNOSIS — G8929 Other chronic pain: Secondary | ICD-10-CM | POA: Diagnosis not present

## 2023-09-28 DIAGNOSIS — I132 Hypertensive heart and chronic kidney disease with heart failure and with stage 5 chronic kidney disease, or end stage renal disease: Secondary | ICD-10-CM | POA: Diagnosis not present

## 2023-09-28 DIAGNOSIS — N186 End stage renal disease: Secondary | ICD-10-CM | POA: Insufficient documentation

## 2023-09-28 DIAGNOSIS — Z794 Long term (current) use of insulin: Secondary | ICD-10-CM | POA: Diagnosis not present

## 2023-09-28 DIAGNOSIS — I5020 Unspecified systolic (congestive) heart failure: Secondary | ICD-10-CM | POA: Insufficient documentation

## 2023-09-28 DIAGNOSIS — E1022 Type 1 diabetes mellitus with diabetic chronic kidney disease: Secondary | ICD-10-CM | POA: Insufficient documentation

## 2023-09-28 DIAGNOSIS — M791 Myalgia, unspecified site: Secondary | ICD-10-CM | POA: Diagnosis present

## 2023-09-28 LAB — COMPREHENSIVE METABOLIC PANEL WITH GFR
ALT: 67 U/L — ABNORMAL HIGH (ref 0–44)
AST: 65 U/L — ABNORMAL HIGH (ref 15–41)
Albumin: 3.4 g/dL — ABNORMAL LOW (ref 3.5–5.0)
Alkaline Phosphatase: 460 U/L — ABNORMAL HIGH (ref 38–126)
Anion gap: 16 — ABNORMAL HIGH (ref 5–15)
BUN: 58 mg/dL — ABNORMAL HIGH (ref 6–20)
CO2: 20 mmol/L — ABNORMAL LOW (ref 22–32)
Calcium: 8.6 mg/dL — ABNORMAL LOW (ref 8.9–10.3)
Chloride: 99 mmol/L (ref 98–111)
Creatinine, Ser: 6.55 mg/dL — ABNORMAL HIGH (ref 0.44–1.00)
GFR, Estimated: 8 mL/min — ABNORMAL LOW (ref >60)
Glucose, Bld: 260 mg/dL — ABNORMAL HIGH (ref 70–99)
Potassium: 5.4 mmol/L — ABNORMAL HIGH (ref 3.5–5.1)
Sodium: 135 mmol/L (ref 135–145)
Total Bilirubin: 0.8 mg/dL (ref 0.0–1.2)
Total Protein: 6.2 g/dL — ABNORMAL LOW (ref 6.5–8.1)

## 2023-09-28 LAB — CBC WITH DIFFERENTIAL/PLATELET
Abs Immature Granulocytes: 0.04 10*3/uL (ref 0.00–0.07)
Basophils Absolute: 0.1 10*3/uL (ref 0.0–0.1)
Basophils Relative: 1 %
Eosinophils Absolute: 0.4 10*3/uL (ref 0.0–0.5)
Eosinophils Relative: 4 %
HCT: 37.8 % (ref 36.0–46.0)
Hemoglobin: 11.4 g/dL — ABNORMAL LOW (ref 12.0–15.0)
Immature Granulocytes: 0 %
Lymphocytes Relative: 29 %
Lymphs Abs: 3 10*3/uL (ref 0.7–4.0)
MCH: 27.5 pg (ref 26.0–34.0)
MCHC: 30.2 g/dL (ref 30.0–36.0)
MCV: 91.1 fL (ref 80.0–100.0)
Monocytes Absolute: 1.5 10*3/uL — ABNORMAL HIGH (ref 0.1–1.0)
Monocytes Relative: 15 %
Neutro Abs: 5.3 10*3/uL (ref 1.7–7.7)
Neutrophils Relative %: 51 %
Platelets: 225 10*3/uL (ref 150–400)
RBC: 4.15 MIL/uL (ref 3.87–5.11)
RDW: 15.1 % (ref 11.5–15.5)
WBC: 10.3 10*3/uL (ref 4.0–10.5)
nRBC: 0 % (ref 0.0–0.2)

## 2023-09-28 MED ORDER — SODIUM ZIRCONIUM CYCLOSILICATE 10 G PO PACK
10.0000 g | PACK | Freq: Once | ORAL | Status: AC
  Administered 2023-09-28: 10 g via ORAL
  Filled 2023-09-28: qty 1

## 2023-09-28 NOTE — ED Provider Triage Note (Signed)
 Emergency Medicine Provider Triage Evaluation Note  Ashley Freeman , a 33 y.o. female  was evaluated in triage.  Pt complains of whole body pain X 4 months.  States her abdomen is the worst pain but it is unchanged from previous.  She was recently mated to the hospital at [facility].  She states she missed dialysis yesterday due to her discomfort.  She is awaiting a follow-up with pain management.  She states she has been only taking Tylenol  and ibuprofen  at home for the pain, but on further review she was prescribed 30 tablets of 2 mg Dilaudid  on 5/26.  When we further discussed this, she states that oral medicine has not been helping and she needs an injection of the medication with Atarax .  Review of Systems  Positive:  Negative: Fever, vomiting  Physical Exam  BP (!) 150/89 (BP Location: Right Arm)   Pulse 91   Temp 98.6 F (37 C) (Oral)   Resp 18   Ht 5\' 3"  (1.6 m)   Wt 60.8 kg   LMP  (LMP Unknown)   SpO2 96%   BMI 23.74 kg/m  Gen:   Awake, no distress   Resp:  Normal effort  MSK:   Moves extremities without difficulty  Other:    Medical Decision Making  Medically screening exam initiated at 2:24 AM.  Appropriate orders placed.  Ashley Freeman was informed that the remainder of the evaluation will be completed by another provider, this initial triage assessment does not replace that evaluation, and the importance of remaining in the ED until their evaluation is complete.     Aimee Houseman, [place] 09/28/23 [phone]

## 2023-09-28 NOTE — ED Triage Notes (Signed)
 PER EMS: pt is from home with c/o "pain all over" x 4 months. She reports the pain is all over her body, everywhere, but is worse in her abdomen. She states she has been seen multiple times for this pain and has not been able to get any answers. She is a dialysis patient and her kidney doctor is trying to get her in with pain management. Last dialysis was Tuesday, but she missed Thursday due to the pain. She is supposed to go today.   BP- 138/90, HR-84, RR-18, 98% RA

## 2023-09-28 NOTE — ED Provider Notes (Signed)
 [place] EMERGENC[facility] DEPARTMENT AT [facility] Provider Note   CSN: [id number] Arrival date & time: 09/28/23  0150     History  Chief Complaint  Patient presents with   Pain    Ashley Freeman is a 33 y.o. female.  The history is provided by the patient.  Patient with extensive history including end-stage renal disease, diabetes presents for chronic pain.  Patient reports she has had diffuse body pain for months.  She reports pain in all of her 4 extremities as well as abdominal pain.  No fevers or vomiting.  No new chest pain.  She missed her recent dialysis session due to significant pain.     Past Medical History:  Diagnosis Date   Abscess, gluteal, right 08/24/2013   Anemia 02/19/2012   Bartholin's gland abscess 09/19/2013   Bipolar disorder (HCC)    BV (bacterial vaginosis) 11/24/2015   Depression    Diabetes mellitus type I (HCC) 2001   Diagnosed at age 68 ; Type I   Diarrhea 05/30/2016   DKA (diabetic ketoacidoses) 08/19/2013   Also in 2018   ESRD (end stage renal disease) (HCC)    Gonorrhea 08/2011   Treated in 09/2011   HFrEF (heart failure with reduced ejection fraction) (HCC)    a. 2022 Echo: EF 40%; b. 10/2021 Echo: EF 55%; b. 07/2022 MV: No ischemia. EF 31%; c. 08/2022 Echo: EF 35%, mildly dil RV, sev TR.   History of trichomoniasis 05/31/2016   Hyperlipidemia 03/28/2016   Hypertension    NICM (nonischemic cardiomyopathy) (HCC)    Sepsis (HCC) 09/19/2013    Home Medications Prior to Admission medications   Medication Sig Start Date End Date Taking? Authorizing Provider  acetaminophen  (TYLENOL ) 500 MG tablet Take 1,000 mg by mouth as needed for mild pain (pain score 1-3) or moderate pain (pain score 4-6).    [provider]  albuterol  (VENTOLIN  HFA) 108 (90 Base) MCG/ACT inhaler Inhale 2 puffs into the lungs every 4 (four) hours as needed for wheezing or shortness of breath. 11/24/22   [provider]  bumetanide  (BUMEX) 2 MG tablet Take 2 mg by mouth daily.    [provider]  calcitRIOL  (ROCALTROL ) 0.25 MCG capsule Take 5 capsules (1.25 mcg total) by mouth every Tuesday, Thursday, and Saturday at 6 PM. 07/09/23   Oral Billings, MD  carvedilol  (COREG ) 25 MG tablet Take 25 mg by mouth 2 (two) times daily with a meal. 05/15/23   [provider]  Continuous Glucose Sensor (DEXCOM G7 SENSOR) MISC Change sensors every 10 days 09/07/22   Shamleffer, Ibtehal Jaralla, MD  cyclobenzaprine  (FLEXERIL ) 5 MG tablet Take 1 tablet (5 mg total) by mouth 3 (three) times daily as needed for muscle spasms. 08/31/23   Ghimire, Estil Heman, MD  dicyclomine  (BENTYL ) 20 MG tablet Take 1 tablet (20 mg total) by mouth 2 (two) times daily for 10 days. 08/31/23 09/10/23  Ghimire, Estil Heman, MD  DULoxetine  (CYMBALTA ) 20 MG capsule Take 20 mg by mouth daily. 07/29/23   [provider]  famotidine  (PEPCID ) 20 MG tablet Take 20 mg by mouth 2 (two) times daily. 07/29/23   [provider]  HYDROcodone -acetaminophen  (NORCO/VICODIN) 5-325 MG tablet Take 1 tablet by mouth every 6 (six) hours as needed for moderate pain (pain score 4-6). 08/31/23   Ghimire, Estil Heman, MD  hydrocortisone  2.5 % cream Apply 1 Application topically daily as needed. 12/28/22 12/28/23  [provider]  hydrOXYzine  (ATARAX ) 25 MG  tablet Take 25 mg by mouth every 6 (six) hours as needed for anxiety or itching. 05/14/23   [provider]  insulin  aspart (NOVOLOG ) 100 UNIT/ML injection Inject 5 Units into the skin 3 (three) times daily as needed for high blood sugar.    [provider]  lamoTRIgine  (LAMICTAL ) 200 MG tablet Take 200 mg by mouth daily. 07/11/23   [provider]  LANTUS  SOLOSTAR 100 UNIT/ML Solostar Pen Inject 10 Units into the skin at bedtime.    [provider]  LORazepam  (ATIVAN ) 1 MG tablet Take 1 mg by mouth daily as needed. 07/11/23   [provider]  losartan  (COZAAR ) 100 MG  tablet Take 150 mg by mouth daily. 03/25/23   [provider]  methylphenidate 18 MG PO CR tablet Take 18 mg by mouth every morning. 08/21/23   [provider]  ondansetron  (ZOFRAN -ODT) 4 MG disintegrating tablet Take 1 tablet (4 mg total) by mouth every 8 (eight) hours as needed for nausea or vomiting. 06/23/23   Davis, Jonathon H, MD  polyethylene glycol (MIRALAX  / GLYCOLAX ) 17 g packet Take 17 g by mouth daily as needed for mild constipation. 12/07/22   [provider]  predniSONE  (DELTASONE ) 20 MG tablet Take 2 tablets (40 mg total) by mouth daily. 09/10/23   Afton Albright, MD  rosuvastatin  (CRESTOR ) 40 MG tablet Take 40 mg by mouth daily. 05/22/22   [provider]  sevelamer  carbonate (RENVELA ) 800 MG tablet Take 2,400 mg by mouth 3 (three) times daily with meals.    [provider]  sodium bicarbonate  650 MG tablet Take 1,300 mg by mouth 2 (two) times daily. 07/29/23   [provider]  SUMAtriptan  (IMITREX ) 50 MG tablet Take 50 mg by mouth every 2 (two) hours as needed for migraine or headache. 06/20/22   [provider]      Allergies    Cephalexin , Morphine , Penicillins, Fish allergy, Benadryl  [diphenhydramine ], Dilaudid  [hydromorphone ], Doxycycline , Methotrexate derivatives, and Oxycodone     Review of Systems   Review of Systems  Cardiovascular:  Negative for chest pain.  Gastrointestinal:  Positive for abdominal pain.  Musculoskeletal:  Positive for arthralgias and myalgias.    Physical Exam Updated Vital Signs BP 128/89   Pulse 83   Temp 98.1 F (36.7 C)   Resp 16   Ht 1.6 m (5\' 3" )   Wt 60.8 kg   LMP  (LMP Unknown)   SpO2 100%   BMI 23.74 kg/m  Physical Exam CONSTITUTIONAL: Chronically ill-appearing, no acute distress HEAD: Normocephalic/atraumatic EYES: EOMI/PERRL ENMT: Mucous membranes moist NECK: supple no meningeal signs CV: S1/S2 noted, no murmurs/rubs/gallops noted LUNGS: Lungs are clear to auscultation  bilaterally, no apparent distress ABDOMEN: soft, mild diffuse tenderness, distended, no rebound or guarding, bowel sounds noted throughout abdomen GU:no cva tenderness NEURO: Pt is awake/alert/appropriate, moves all extremitiesx4.  No facial droop.   EXTREMITIES: pulses normal/equal, full ROM No deformities are noted, no edema, no bruising SKIN: warm, color normal PSYCH: no abnormalities of mood noted, alert and oriented to situation  ED Results / Procedures / Treatments   Labs (all labs ordered are listed, but only abnormal results are displayed) Labs Reviewed  COMPREHENSIVE METABOLIC PANEL WITH GFR - Abnormal; Notable for the following components:      Result Value   Potassium 5.4 (*)    CO2 20 (*)    Glucose, Bld 260 (*)    BUN 58 (*)    Creatinine, Ser 6.55 (*)  Calcium  8.6 (*)    Total Protein 6.2 (*)    Albumin 3.4 (*)    AST 65 (*)    ALT 67 (*)    Alkaline Phosphatase 460 (*)    GFR, Estimated 8 (*)    Anion gap 16 (*)    All other components within normal limits  CBC WITH DIFFERENTIAL/PLATELET - Abnormal; Notable for the following components:   Hemoglobin 11.4 (*)    Monocytes Absolute 1.5 (*)    All other components within normal limits    EKG EKG Interpretation Date/Time:  Saturday Sep 28 2023 06:13:31 EDT Ventricular Rate:  80 PR Interval:  143 QRS Duration:  99 QT Interval:  412 QTC Calculation: 476 R Axis:   63  Text Interpretation: Sinus rhythm Borderline prolonged QT interval Confirmed by Eldon Greenland ([id number]) on 09/28/2023 6:18:23 AM  Radiology No results found.  Procedures Procedures    Medications Ordered in ED Medications  sodium zirconium cyclosilicate  (LOKELMA ) packet 10 g (has no administration in time range)    ED Course/ Medical Decision Making/ A&P Clinical Course as of 09/28/23 [id number]  Sat Sep 28, 2023  0548 Patient with extensive history including end-stage renal disease, presents with continued chronic pain for months.  She  is seeking pain management although she has Dilaudid  at home labs revealed mild hyperkalemia, mild hyperglycemia but bicarb is at 20, low suspicion for DKA no leukocytosis and her hemoglobin is stable. Patient had extensive evaluation of her abdominal pain in the past with multiple imaging studies She has had ascites before and has required paracentesis, however this appears to be more of a chronic issue [DW]  [phone] Patient reports she is not sure what happened to her Dilaudid  prescription.  I advised her due to her chronic pain, we would be unable to provide her any pain medicine  Due to her mildly elevated potassium, will give a dose of Lokelma , but she reports she has dialysis arranged later this morning and can get a ride there. I suspect most of her symptoms are due to recurrent ascites which has been evaluated previously.  Encouraged her to continue her dialysis sessions. Most of her issues are chronic in nature, will defer further workup for now [DW]    Clinical Course User Index [DW] Eldon Greenland, MD                                 Medical Decision Making Amount and/or Complexity of Data Reviewed ECG/medicine tests: ordered.  Risk Prescription drug management.           Final Clinical Impression(s) / ED Diagnoses Final diagnoses:  ESRD (end stage renal disease) (HCC)  Other chronic pain    Rx / DC Orders ED Discharge Orders     None         Eldon Greenland, MD 09/28/23 [phone]

## 2023-10-05 ENCOUNTER — Emergency Department (HOSPITAL_COMMUNITY)

## 2023-10-05 ENCOUNTER — Encounter (HOSPITAL_COMMUNITY): Payer: Self-pay | Admitting: *Deleted

## 2023-10-05 ENCOUNTER — Emergency Department (HOSPITAL_COMMUNITY)
Admission: EM | Admit: 2023-10-05 | Discharge: 2023-10-06 | Discharge status: Against Medical Advice | Attending: Emergency Medicine | Admitting: Emergency Medicine

## 2023-10-05 ENCOUNTER — Other Ambulatory Visit: Payer: Self-pay

## 2023-10-05 DIAGNOSIS — R1084 Generalized abdominal pain: Secondary | ICD-10-CM | POA: Insufficient documentation

## 2023-10-05 DIAGNOSIS — R600 Localized edema: Secondary | ICD-10-CM | POA: Insufficient documentation

## 2023-10-05 DIAGNOSIS — E1122 Type 2 diabetes mellitus with diabetic chronic kidney disease: Secondary | ICD-10-CM | POA: Insufficient documentation

## 2023-10-05 DIAGNOSIS — R109 Unspecified abdominal pain: Secondary | ICD-10-CM

## 2023-10-05 DIAGNOSIS — E8770 Fluid overload, unspecified: Secondary | ICD-10-CM

## 2023-10-05 DIAGNOSIS — I509 Heart failure, unspecified: Secondary | ICD-10-CM | POA: Insufficient documentation

## 2023-10-05 DIAGNOSIS — Z794 Long term (current) use of insulin: Secondary | ICD-10-CM | POA: Insufficient documentation

## 2023-10-05 DIAGNOSIS — N186 End stage renal disease: Secondary | ICD-10-CM | POA: Insufficient documentation

## 2023-10-05 DIAGNOSIS — Z992 Dependence on renal dialysis: Secondary | ICD-10-CM | POA: Diagnosis not present

## 2023-10-05 DIAGNOSIS — Z5329 Procedure and treatment not carried out because of patient's decision for other reasons: Secondary | ICD-10-CM | POA: Insufficient documentation

## 2023-10-05 LAB — I-STAT CHEM 8, ED
BUN: 56 mg/dL — ABNORMAL HIGH (ref 6–20)
Calcium, Ion: 1.02 mmol/L — ABNORMAL LOW (ref 1.15–1.40)
Chloride: 99 mmol/L (ref 98–111)
Creatinine, Ser: 8.6 mg/dL — ABNORMAL HIGH (ref 0.44–1.00)
Glucose, Bld: 476 mg/dL — ABNORMAL HIGH (ref 70–99)
HCT: 40 % (ref 36.0–46.0)
Hemoglobin: 13.6 g/dL (ref 12.0–15.0)
Potassium: 4.4 mmol/L (ref 3.5–5.1)
Sodium: 133 mmol/L — ABNORMAL LOW (ref 135–145)
TCO2: 20 mmol/L — ABNORMAL LOW (ref 22–32)

## 2023-10-05 LAB — CBC
HCT: 38.8 % (ref 36.0–46.0)
Hemoglobin: 11.9 g/dL — ABNORMAL LOW (ref 12.0–15.0)
MCH: 27.5 pg (ref 26.0–34.0)
MCHC: 30.7 g/dL (ref 30.0–36.0)
MCV: 89.6 fL (ref 80.0–100.0)
Platelets: 311 10*3/uL (ref 150–400)
RBC: 4.33 MIL/uL (ref 3.87–5.11)
RDW: 15.3 % (ref 11.5–15.5)
WBC: 4.8 10*3/uL (ref 4.0–10.5)
nRBC: 0 % (ref 0.0–0.2)

## 2023-10-05 LAB — COMPREHENSIVE METABOLIC PANEL WITH GFR
ALT: 58 U/L — ABNORMAL HIGH (ref 0–44)
AST: 93 U/L — ABNORMAL HIGH (ref 15–41)
Albumin: 3.1 g/dL — ABNORMAL LOW (ref 3.5–5.0)
Alkaline Phosphatase: 496 U/L — ABNORMAL HIGH (ref 38–126)
Anion gap: 18 — ABNORMAL HIGH (ref 5–15)
BUN: 64 mg/dL — ABNORMAL HIGH (ref 6–20)
CO2: 20 mmol/L — ABNORMAL LOW (ref 22–32)
Calcium: 8.3 mg/dL — ABNORMAL LOW (ref 8.9–10.3)
Chloride: 97 mmol/L — ABNORMAL LOW (ref 98–111)
Creatinine, Ser: 8.9 mg/dL — ABNORMAL HIGH (ref 0.44–1.00)
GFR, Estimated: 6 mL/min — ABNORMAL LOW (ref >60)
Glucose, Bld: 506 mg/dL (ref 70–99)
Potassium: 4.5 mmol/L (ref 3.5–5.1)
Sodium: 135 mmol/L (ref 135–145)
Total Bilirubin: 0.9 mg/dL (ref 0.0–1.2)
Total Protein: 6 g/dL — ABNORMAL LOW (ref 6.5–8.1)

## 2023-10-05 LAB — BRAIN NATRIURETIC PEPTIDE: B Natriuretic Peptide: >4500 pg/mL — ABNORMAL HIGH (ref 0.0–100.0)

## 2023-10-05 LAB — HCG, SERUM, QUALITATIVE: Preg, Serum: NEGATIVE

## 2023-10-05 MED ORDER — FENTANYL CITRATE PF 50 MCG/ML IJ SOSY
50.0000 ug | PREFILLED_SYRINGE | Freq: Once | INTRAMUSCULAR | Status: AC
  Administered 2023-10-05: 50 ug via INTRAVENOUS
  Filled 2023-10-05: qty 1

## 2023-10-05 NOTE — ED Provider Notes (Signed)
 [facility] Provider Note   CSN: [id number] Arrival date & time: 10/05/23  2106     History  Chief Complaint  Patient presents with   Abdominal Pain    Ashley Freeman is a 33 y.o. female.  Patient is a 33 year old female with a past medical history of ESRD on TTS HD, CHF, diabetes presenting to the emergency department with missed dialysis.  The patient states that her last dialysis session was on Tuesday.  She states that she missed the rest of her sessions due to not having childcare.  She states that she has had increasing shortness of breath and abdominal swelling and distention.  She states that she develops fluid in her abdomen when she misses dialysis and is having associated pain related to this.  She states that her blood sugars have been running a little high of the last 2 days as well.  She denies any associated nausea, vomiting, fevers or chills.  She states that she does still make urine but denies any dysuria or hematuria.  She denies any chest pain or cough.  The history is provided by the patient.  Abdominal Pain      Home Medications Prior to Admission medications   Medication Sig Start Date End Date Taking? Authorizing Provider  acetaminophen  (TYLENOL ) 500 MG tablet Take 1,000 mg by mouth as needed for mild pain (pain score 1-3) or moderate pain (pain score 4-6).    [provider]  albuterol  (VENTOLIN  HFA) 108 (90 Base) MCG/ACT inhaler Inhale 2 puffs into the lungs every 4 (four) hours as needed for wheezing or shortness of breath. 11/24/22   [provider]  bumetanide (BUMEX) 2 MG tablet Take 2 mg by mouth daily.    [provider]  calcitRIOL  (ROCALTROL ) 0.25 MCG capsule Take 5 capsules (1.25 mcg total) by mouth every Tuesday, Thursday, and Saturday at 6 PM. 07/09/23   Oral Billings, MD  carvedilol  (COREG ) 25 MG tablet Take 25 mg by mouth 2 (two) times daily with a meal. 05/15/23    [provider]  Continuous Glucose Sensor (DEXCOM G7 SENSOR) MISC Change sensors every 10 days 09/07/22   Shamleffer, Ibtehal Jaralla, MD  cyclobenzaprine  (FLEXERIL ) 5 MG tablet Take 1 tablet (5 mg total) by mouth 3 (three) times daily as needed for muscle spasms. 08/31/23   Ghimire, Estil Heman, MD  dicyclomine  (BENTYL ) 20 MG tablet Take 1 tablet (20 mg total) by mouth 2 (two) times daily for 10 days. 08/31/23 09/10/23  Ghimire, Estil Heman, MD  DULoxetine  (CYMBALTA ) 20 MG capsule Take 20 mg by mouth daily. 07/29/23   [provider]  famotidine  (PEPCID ) 20 MG tablet Take 20 mg by mouth 2 (two) times daily. 07/29/23   [provider]  HYDROcodone -acetaminophen  (NORCO/VICODIN) 5-325 MG tablet Take 1 tablet by mouth every 6 (six) hours as needed for moderate pain (pain score 4-6). 08/31/23   Ghimire, Estil Heman, MD  hydrocortisone  2.5 % cream Apply 1 Application topically daily as needed. 12/28/22 12/28/23  [provider]  hydrOXYzine  (ATARAX ) 25 MG tablet Take 25 mg by mouth every 6 (six) hours as needed for anxiety or itching. 05/14/23   [provider]  insulin  aspart (NOVOLOG ) 100 UNIT/ML injection Inject 5 Units into the skin 3 (three) times daily as needed for high blood sugar.    [provider]  lamoTRIgine  (LAMICTAL ) 200 MG tablet Take 200 mg by mouth daily. 07/11/23   [provider]  LANTUS  SOLOSTAR 100 UNIT/ML Solostar Pen Inject 10 Units into the skin at bedtime.    [provider]  LORazepam  (ATIVAN ) 1 MG tablet Take 1 mg by mouth daily as needed. 07/11/23   [provider]  losartan  (COZAAR ) 100 MG tablet Take 150 mg by mouth daily. 03/25/23   [provider]  methylphenidate 18 MG PO CR tablet Take 18 mg by mouth every morning. 08/21/23   [provider]  ondansetron  (ZOFRAN -ODT) 4 MG disintegrating tablet Take 1 tablet (4 mg total) by mouth every 8 (eight) hours as needed for nausea or vomiting. 06/23/23    Davis, Jonathon H, MD  polyethylene glycol (MIRALAX  / GLYCOLAX ) 17 g packet Take 17 g by mouth daily as needed for mild constipation. 12/07/22   [provider]  predniSONE  (DELTASONE ) 20 MG tablet Take 2 tablets (40 mg total) by mouth daily. 09/10/23   Afton Albright, MD  rosuvastatin  (CRESTOR ) 40 MG tablet Take 40 mg by mouth daily. 05/22/22   [provider]  sevelamer  carbonate (RENVELA ) 800 MG tablet Take 2,400 mg by mouth 3 (three) times daily with meals.    [provider]  sodium bicarbonate  650 MG tablet Take 1,300 mg by mouth 2 (two) times daily. 07/29/23   [provider]  SUMAtriptan  (IMITREX ) 50 MG tablet Take 50 mg by mouth every 2 (two) hours as needed for migraine or headache. 06/20/22   [provider]      Allergies    Cephalexin , Morphine , Penicillins, Fish allergy, Benadryl  [diphenhydramine ], Dilaudid  [hydromorphone ], Doxycycline , Methotrexate derivatives, and Oxycodone     Review of Systems   Review of Systems  Gastrointestinal:  Positive for abdominal pain.    Physical Exam Updated Vital Signs BP (!) 152/100   Pulse 92   Temp 98.3 F (36.8 C) (Oral)   Resp (!) 30   Ht 5\' 3"  (1.6 m)   Wt 61.7 kg   LMP  (LMP Unknown)   SpO2 99%   BMI 24.09 kg/m  Physical Exam Vitals and nursing note reviewed.  Constitutional:      General: She is not in acute distress.    Appearance: She is well-developed.  HENT:     Head: Normocephalic and atraumatic.     Mouth/Throat:     Mouth: Mucous membranes are moist.  Eyes:     Extraocular Movements: Extraocular movements intact.  Cardiovascular:     Rate and Rhythm: Normal rate and regular rhythm.     Heart sounds: Normal heart sounds.  Pulmonary:     Effort: Pulmonary effort is normal.     Breath sounds: Normal breath sounds.  Abdominal:     General: There is distension.     Palpations: Abdomen is soft.     Tenderness: There is generalized abdominal tenderness.  Musculoskeletal:      Right lower leg: 1+ Pitting Edema present.     Left lower leg: 1+ Pitting Edema present.  Skin:    General: Skin is warm and dry.  Neurological:     General: No focal deficit present.     Mental Status: She is alert and oriented to person, place, and time.  Psychiatric:        Mood and Affect: Mood normal.        Behavior: Behavior normal.     ED Results / Procedures / Treatments   Labs (all labs ordered are listed, but only abnormal results are displayed) Labs Reviewed  CBC - Abnormal; Notable for  the following components:      Result Value   Hemoglobin 11.9 (*)    All other components within normal limits  I-STAT CHEM 8, ED - Abnormal; Notable for the following components:   Sodium 133 (*)    BUN 56 (*)    Creatinine, Ser 8.60 (*)    Glucose, Bld 476 (*)    Calcium , Ion 1.02 (*)    TCO2 20 (*)    All other components within normal limits  HCG, SERUM, QUALITATIVE  COMPREHENSIVE METABOLIC PANEL WITH GFR  BRAIN NATRIURETIC PEPTIDE  CBG MONITORING, ED    EKG EKG Interpretation Date/Time:  Saturday October 05 2023 22:20:03 EDT Ventricular Rate:  91 PR Interval:  136 QRS Duration:  85 QT Interval:  411 QTC Calculation: 506 R Axis:   87  Text Interpretation: Sinus rhythm Borderline repolarization abnormality Prolonged QT interval No significant change since last tracing Confirmed by Celesta Coke ([id number]) on 10/05/2023 10:55:45 PM  Radiology DG Chest 2 View Result Date: 10/05/2023 CLINICAL DATA:  Shortness of breath EXAM: CHEST - 2 VIEW COMPARISON:  09/09/2023 FINDINGS: Unchanged cardiomegaly.  Lungs are clear.  Normal pleural spaces. IMPRESSION: Unchanged cardiomegaly. Electronically Signed   By: Juanetta Nordmann M.D.   On: 10/05/2023 22:12    Procedures Procedures    Medications Ordered in ED Medications  fentaNYL  (SUBLIMAZE ) injection 50 mcg (50 mcg Intravenous Given 10/05/23 2244)    ED Course/ Medical Decision Making/ A&P Clinical Course as of 10/05/23 2316  Sat  Oct 05, 2023  2315 Patient signed out to Dr. Luberta Ruse pending labs with likely plan for HD for volume overload. [VK]    Clinical Course User Index [VK] Kingsley, Abbigale Mcelhaney K, DO                                 Medical Decision Making This patient presents to the ED with chief complaint(s) of abdominal distention, shortness of breath, last HD with pertinent past medical history of ESRD on TTS HD, CHF, diabetes which further complicates the presenting complaint. The complaint involves an extensive differential diagnosis and also carries with it a high risk of complications and morbidity.    The differential diagnosis includes volume overload, pulmonary edema, ascites, electrolyte derangement, hyperglycemic crisis, DKA, pancreatitis, hepatitis, no point abdominal tenderness making intra-abdominal infection less likely  Additional history obtained: Additional history obtained from N/A Records reviewed Care Everywhere/External Records  ED Course and Reassessment: On patient's arrival she is hemodynamically stable in no acute distress, is mildly tachypneic.  She does appear volume overloaded with abdominal distention, reviewing her records she does frequently get ascites with missed dialysis.  Patient had EKG performed that showed no ischemic changes and no hyperkalemic changes.  She will have labs and chest x-ray performed and will be closely reassessed.  Suspect that she will likely need HD.  Independent labs interpretation:  The following labs were independently interpreted: CBC within normal range, labs otherwise pending  Independent visualization of imaging: - I independently visualized the following imaging with scope of interpretation limited to determining acute life threatening conditions related to emergency care: CXR, which revealed no acute disease   Amount and/or Complexity of Data Reviewed Labs: ordered. Radiology: ordered.  Risk Prescription drug  management.          Final Clinical Impression(s) / ED Diagnoses Final diagnoses:  None    Rx / DC Orders ED Discharge Orders  None         Nolberto Batty, DO 10/05/23 2316

## 2023-10-05 NOTE — ED Triage Notes (Addendum)
 Pt here via [facility] from home for abdominal pain and swelling.  Last dialysis Tuesday.  Pt was unable to make her dialysis appts on H and Sat d/t lack of child care.    Since yesterday pt has been experiencing generalized pain.    CBG 537 Bp 140/84 Hr 98 spo2  95 RA Rr 22

## 2023-10-05 NOTE — ED Provider Notes (Signed)
 11:06 PM Assumed care from Dr. Nora Beal, please see their note for full history, physical and decision making until this point. In brief this is a 33 y.o. year old female who presented to the ED tonight with Abdominal Pain     Missed dialysis. Here with dyspnea. Pending labs. Will likely need dialysis before d/c. Pending labs for nephro call.  Labs without hyper-K, no hypoxia. D/w Dr. Zana Hesselbach, will plan for dialysis in AM.   Apparently patient no longer wanted to wait. While I was busy with other critical patients, patient apparently left AMA.   Labs, studies and imaging reviewed by myself and considered in medical decision making if ordered. Imaging interpreted by radiology.  Labs Reviewed  CBC - Abnormal; Notable for the following components:      Result Value   Hemoglobin 11.9 (*)    All other components within normal limits  I-STAT CHEM 8, ED - Abnormal; Notable for the following components:   Sodium 133 (*)    BUN 56 (*)    Creatinine, Ser 8.60 (*)    Glucose, Bld 476 (*)    Calcium , Ion 1.02 (*)    TCO2 20 (*)    All other components within normal limits  HCG, SERUM, QUALITATIVE  COMPREHENSIVE METABOLIC PANEL WITH GFR  BRAIN NATRIURETIC PEPTIDE  CBG MONITORING, ED    DG Chest 2 View  Final Result      No follow-ups on file.    Rachell Druckenmiller, Reymundo Caulk, MD 10/06/23 [phone]

## 2023-10-05 NOTE — ED Notes (Signed)
Attempted IV access - unsuccessful.

## 2023-10-06 LAB — CBG MONITORING, ED: Glucose-Capillary: 260 mg/dL — ABNORMAL HIGH (ref 70–99)

## 2023-10-06 MED ORDER — INSULIN ASPART 100 UNIT/ML IV SOLN
10.0000 [IU] | Freq: Once | INTRAVENOUS | Status: AC
  Administered 2023-10-06: 10 [IU] via INTRAVENOUS

## 2023-10-06 MED ORDER — HYDROMORPHONE HCL 2 MG PO TABS
2.0000 mg | ORAL_TABLET | Freq: Once | ORAL | Status: AC
  Administered 2023-10-06: 2 mg via ORAL
  Filled 2023-10-06: qty 1

## 2023-10-06 MED ORDER — HYDROMORPHONE HCL 2 MG PO TABS
2.0000 mg | ORAL_TABLET | Freq: Once | ORAL | Status: DC
  Filled 2023-10-06: qty 1

## 2023-10-06 NOTE — ED Notes (Signed)
 IV removed by pt request. Pt leaving AMA at this time. Attempted AMA signature on three computers pt unwilling to wait further. Pt departing at this time.

## 2023-10-06 NOTE — ED Notes (Signed)
 Pt glucose 268. Glucometer not crossing over into chart.

## 2023-10-06 NOTE — ED Notes (Signed)
 Pt sts she want to get peritoneal dialysis. Per EDP pt will be scheduled for hemodialysis later this morning. Pt updated in POC.

## 2023-10-06 NOTE — ED Notes (Signed)
 Pt sts she is ready to leave. Refused po dilaudid . EDP [facility] informed.

## 2023-10-11 ENCOUNTER — Ambulatory Visit: Admitting: Podiatry

## 2023-10-12 ENCOUNTER — Other Ambulatory Visit: Payer: Self-pay

## 2023-10-12 ENCOUNTER — Emergency Department (HOSPITAL_COMMUNITY)

## 2023-10-12 ENCOUNTER — Encounter (HOSPITAL_COMMUNITY): Payer: Self-pay | Admitting: Emergency Medicine

## 2023-10-12 ENCOUNTER — Observation Stay (HOSPITAL_COMMUNITY)
Admission: EM | Admit: 2023-10-12 | Discharge: 2023-10-14 | DRG: 291 | Disposition: A | Discharge status: Home / Self Care | Attending: Emergency Medicine | Admitting: Emergency Medicine

## 2023-10-12 DIAGNOSIS — Z992 Dependence on renal dialysis: Secondary | ICD-10-CM

## 2023-10-12 DIAGNOSIS — N186 End stage renal disease: Secondary | ICD-10-CM | POA: Diagnosis present

## 2023-10-12 DIAGNOSIS — E785 Hyperlipidemia, unspecified: Secondary | ICD-10-CM | POA: Diagnosis present

## 2023-10-12 DIAGNOSIS — Z91158 Patient's noncompliance with renal dialysis for other reason: Secondary | ICD-10-CM

## 2023-10-12 DIAGNOSIS — Z880 Allergy status to penicillin: Secondary | ICD-10-CM

## 2023-10-12 DIAGNOSIS — R601 Generalized edema: Principal | ICD-10-CM

## 2023-10-12 DIAGNOSIS — Z881 Allergy status to other antibiotic agents status: Secondary | ICD-10-CM

## 2023-10-12 DIAGNOSIS — Z7952 Long term (current) use of systemic steroids: Secondary | ICD-10-CM

## 2023-10-12 DIAGNOSIS — I5033 Acute on chronic diastolic (congestive) heart failure: Secondary | ICD-10-CM | POA: Diagnosis not present

## 2023-10-12 DIAGNOSIS — F319 Bipolar disorder, unspecified: Secondary | ICD-10-CM | POA: Diagnosis present

## 2023-10-12 DIAGNOSIS — N2581 Secondary hyperparathyroidism of renal origin: Secondary | ICD-10-CM | POA: Diagnosis present

## 2023-10-12 DIAGNOSIS — E1065 Type 1 diabetes mellitus with hyperglycemia: Secondary | ICD-10-CM | POA: Diagnosis present

## 2023-10-12 DIAGNOSIS — E1042 Type 1 diabetes mellitus with diabetic polyneuropathy: Secondary | ICD-10-CM | POA: Diagnosis present

## 2023-10-12 DIAGNOSIS — I132 Hypertensive heart and chronic kidney disease with heart failure and with stage 5 chronic kidney disease, or end stage renal disease: Principal | ICD-10-CM | POA: Diagnosis present

## 2023-10-12 DIAGNOSIS — E1022 Type 1 diabetes mellitus with diabetic chronic kidney disease: Secondary | ICD-10-CM | POA: Diagnosis present

## 2023-10-12 DIAGNOSIS — R188 Other ascites: Secondary | ICD-10-CM | POA: Diagnosis present

## 2023-10-12 DIAGNOSIS — Z833 Family history of diabetes mellitus: Secondary | ICD-10-CM

## 2023-10-12 DIAGNOSIS — I5043 Acute on chronic combined systolic (congestive) and diastolic (congestive) heart failure: Secondary | ICD-10-CM | POA: Diagnosis present

## 2023-10-12 DIAGNOSIS — J918 Pleural effusion in other conditions classified elsewhere: Secondary | ICD-10-CM | POA: Diagnosis present

## 2023-10-12 DIAGNOSIS — I953 Hypotension of hemodialysis: Secondary | ICD-10-CM | POA: Diagnosis not present

## 2023-10-12 DIAGNOSIS — Z79899 Other long term (current) drug therapy: Secondary | ICD-10-CM

## 2023-10-12 DIAGNOSIS — Z883 Allergy status to other anti-infective agents status: Secondary | ICD-10-CM

## 2023-10-12 DIAGNOSIS — E10649 Type 1 diabetes mellitus with hypoglycemia without coma: Secondary | ICD-10-CM | POA: Diagnosis not present

## 2023-10-12 DIAGNOSIS — Z888 Allergy status to other drugs, medicaments and biological substances status: Secondary | ICD-10-CM

## 2023-10-12 DIAGNOSIS — Z885 Allergy status to narcotic agent status: Secondary | ICD-10-CM

## 2023-10-12 DIAGNOSIS — I428 Other cardiomyopathies: Secondary | ICD-10-CM | POA: Diagnosis present

## 2023-10-12 DIAGNOSIS — M940 Chondrocostal junction syndrome [Tietze]: Secondary | ICD-10-CM | POA: Diagnosis present

## 2023-10-12 DIAGNOSIS — D631 Anemia in chronic kidney disease: Secondary | ICD-10-CM | POA: Diagnosis present

## 2023-10-12 DIAGNOSIS — Z825 Family history of asthma and other chronic lower respiratory diseases: Secondary | ICD-10-CM

## 2023-10-12 LAB — BASIC METABOLIC PANEL WITH GFR
Anion gap: 16 — ABNORMAL HIGH (ref 5–15)
BUN: 42 mg/dL — ABNORMAL HIGH (ref 6–20)
CO2: 21 mmol/L — ABNORMAL LOW (ref 22–32)
Calcium: 7.9 mg/dL — ABNORMAL LOW (ref 8.9–10.3)
Chloride: 98 mmol/L (ref 98–111)
Creatinine, Ser: 7.74 mg/dL — ABNORMAL HIGH (ref 0.44–1.00)
GFR, Estimated: 7 mL/min — ABNORMAL LOW (ref >60)
Glucose, Bld: 348 mg/dL — ABNORMAL HIGH (ref 70–99)
Potassium: 3.6 mmol/L (ref 3.5–5.1)
Sodium: 135 mmol/L (ref 135–145)

## 2023-10-12 LAB — CBC WITH DIFFERENTIAL/PLATELET
Abs Immature Granulocytes: 0.2 10*3/uL — ABNORMAL HIGH (ref 0.00–0.07)
Basophils Absolute: 0.1 10*3/uL (ref 0.0–0.1)
Basophils Relative: 1 %
Eosinophils Absolute: 0.3 10*3/uL (ref 0.0–0.5)
Eosinophils Relative: 2 %
HCT: 34.2 % — ABNORMAL LOW (ref 36.0–46.0)
Hemoglobin: 10.8 g/dL — ABNORMAL LOW (ref 12.0–15.0)
Immature Granulocytes: 2 %
Lymphocytes Relative: 30 %
Lymphs Abs: 3.2 10*3/uL (ref 0.7–4.0)
MCH: 27.8 pg (ref 26.0–34.0)
MCHC: 31.6 g/dL (ref 30.0–36.0)
MCV: 88.1 fL (ref 80.0–100.0)
Monocytes Absolute: 1.1 10*3/uL — ABNORMAL HIGH (ref 0.1–1.0)
Monocytes Relative: 11 %
Neutro Abs: 5.8 10*3/uL (ref 1.7–7.7)
Neutrophils Relative %: 54 %
Platelets: 191 10*3/uL (ref 150–400)
RBC: 3.88 MIL/uL (ref 3.87–5.11)
RDW: 14.9 % (ref 11.5–15.5)
WBC: 10.6 10*3/uL — ABNORMAL HIGH (ref 4.0–10.5)
nRBC: 0 % (ref 0.0–0.2)

## 2023-10-12 LAB — TROPONIN I (HIGH SENSITIVITY)
Troponin I (High Sensitivity): 19 ng/L — ABNORMAL HIGH (ref <18)
Troponin I (High Sensitivity): 19 ng/L — ABNORMAL HIGH (ref <18)
Troponin I (High Sensitivity): 20 ng/L — ABNORMAL HIGH (ref <18)

## 2023-10-12 LAB — BETA-HYDROXYBUTYRIC ACID: Beta-Hydroxybutyric Acid: <0.05 mmol/L — ABNORMAL LOW (ref 0.05–0.27)

## 2023-10-12 LAB — HCG, SERUM, QUALITATIVE: Preg, Serum: NEGATIVE

## 2023-10-12 LAB — GLUCOSE, CAPILLARY
Glucose-Capillary: 186 mg/dL — ABNORMAL HIGH (ref 70–99)
Glucose-Capillary: 97 mg/dL (ref 70–99)

## 2023-10-12 LAB — HEPATITIS B SURFACE ANTIGEN: Hepatitis B Surface Ag: NONREACTIVE

## 2023-10-12 LAB — BRAIN NATRIURETIC PEPTIDE: B Natriuretic Peptide: >4500 pg/mL — ABNORMAL HIGH (ref 0.0–100.0)

## 2023-10-12 LAB — CBG MONITORING, ED: Glucose-Capillary: 211 mg/dL — ABNORMAL HIGH (ref 70–99)

## 2023-10-12 MED ORDER — LAMOTRIGINE 100 MG PO TABS
200.0000 mg | ORAL_TABLET | Freq: Every day | ORAL | Status: DC
  Administered 2023-10-12 – 2023-10-14 (×3): 200 mg via ORAL
  Filled 2023-10-12: qty 8
  Filled 2023-10-12 (×2): qty 2

## 2023-10-12 MED ORDER — IPRATROPIUM-ALBUTEROL 0.5-2.5 (3) MG/3ML IN SOLN
3.0000 mL | Freq: Four times a day (QID) | RESPIRATORY_TRACT | Status: DC | PRN
  Filled 2023-10-12: qty 3

## 2023-10-12 MED ORDER — OXYCODONE HCL 5 MG PO TABS
5.0000 mg | ORAL_TABLET | ORAL | Status: DC | PRN
  Administered 2023-10-13: 5 mg via ORAL
  Filled 2023-10-12 (×4): qty 1

## 2023-10-12 MED ORDER — INSULIN ASPART 100 UNIT/ML IJ SOLN: 0.0000 [IU] | Freq: Three times a day (TID) | INTRAMUSCULAR | Status: DC

## 2023-10-12 MED ORDER — DULOXETINE HCL 20 MG PO CPEP
20.0000 mg | ORAL_CAPSULE | Freq: Every day | ORAL | Status: DC
  Administered 2023-10-13 – 2023-10-14 (×2): 20 mg via ORAL
  Filled 2023-10-12 (×3): qty 1

## 2023-10-12 MED ORDER — ACETAMINOPHEN 500 MG PO TABS
1000.0000 mg | ORAL_TABLET | Freq: Once | ORAL | Status: AC
  Filled 2023-10-12: qty 2

## 2023-10-12 MED ORDER — CALCITRIOL 0.5 MCG PO CAPS
1.2500 ug | ORAL_CAPSULE | ORAL | Status: DC
  Filled 2023-10-12: qty 1

## 2023-10-12 MED ORDER — INSULIN GLARGINE-YFGN 100 UNIT/ML ~~LOC~~ SOLN
10.0000 [IU] | Freq: Every day | SUBCUTANEOUS | Status: DC
  Administered 2023-10-13: 10 [IU] via SUBCUTANEOUS
  Filled 2023-10-12 (×2): qty 0.1

## 2023-10-12 MED ORDER — ROSUVASTATIN CALCIUM 20 MG PO TABS
40.0000 mg | ORAL_TABLET | Freq: Every day | ORAL | Status: DC
  Administered 2023-10-13 – 2023-10-14 (×2): 40 mg via ORAL
  Filled 2023-10-12 (×3): qty 2

## 2023-10-12 MED ORDER — ORAL CARE MOUTH RINSE
15.0000 mL | OROMUCOSAL | Status: AC | PRN
Start: 2023-10-12 — End: ?

## 2023-10-12 MED ORDER — SODIUM BICARBONATE 650 MG PO TABS
1300.0000 mg | ORAL_TABLET | Freq: Two times a day (BID) | ORAL | Status: DC
  Administered 2023-10-13 – 2023-10-14 (×3): 1300 mg via ORAL
  Filled 2023-10-12 (×5): qty 2

## 2023-10-12 MED ORDER — FENTANYL CITRATE PF 50 MCG/ML IJ SOSY: 25.0000 ug | PREFILLED_SYRINGE | INTRAMUSCULAR | Status: DC | PRN

## 2023-10-12 MED ORDER — NALOXONE HCL 0.4 MG/ML IJ SOLN: 0.4000 mg | INTRAMUSCULAR | Status: DC | PRN

## 2023-10-12 MED ORDER — FAMOTIDINE 20 MG PO TABS
20.0000 mg | ORAL_TABLET | Freq: Two times a day (BID) | ORAL | Status: DC
  Filled 2023-10-12: qty 1

## 2023-10-12 MED ORDER — HYDROMORPHONE HCL 1 MG/ML IJ SOLN
0.5000 mg | Freq: Once | INTRAMUSCULAR | Status: AC
  Filled 2023-10-12: qty 1

## 2023-10-12 MED ORDER — NITROGLYCERIN 2 % TD OINT
0.5000 [in_us] | TOPICAL_OINTMENT | TRANSDERMAL | Status: AC
  Filled 2023-10-12: qty 1

## 2023-10-12 MED ORDER — FENTANYL CITRATE PF 50 MCG/ML IJ SOSY
50.0000 ug | PREFILLED_SYRINGE | Freq: Once | INTRAMUSCULAR | Status: AC
  Filled 2023-10-12: qty 1

## 2023-10-12 MED ORDER — FAMOTIDINE 20 MG PO TABS
10.0000 mg | ORAL_TABLET | Freq: Every day | ORAL | Status: DC
  Administered 2023-10-13 – 2023-10-14 (×2): 10 mg via ORAL
  Filled 2023-10-12 (×2): qty 1

## 2023-10-12 MED ORDER — OLANZAPINE 5 MG PO TBDP
5.0000 mg | ORAL_TABLET | Freq: Every day | ORAL | Status: DC
  Administered 2023-10-13: 5 mg via ORAL
  Filled 2023-10-12 (×3): qty 1

## 2023-10-12 MED ORDER — ACETAMINOPHEN 650 MG RE SUPP
650.0000 mg | Freq: Four times a day (QID) | RECTAL | Status: AC | PRN
Start: 2023-10-12 — End: ?

## 2023-10-12 MED ORDER — PANCRELIPASE (LIP-PROT-AMYL) 12000-38000 UNITS PO CPEP
12000.0000 [IU] | ORAL_CAPSULE | Freq: Three times a day (TID) | ORAL | Status: DC
  Administered 2023-10-13 – 2023-10-14 (×5): 12000 [IU] via ORAL
  Filled 2023-10-12 (×9): qty 1

## 2023-10-12 MED ORDER — ALBUMIN HUMAN 25 % IV SOLN: 12.5000 g | Freq: Once | INTRAVENOUS | Status: AC

## 2023-10-12 MED ORDER — SEVELAMER CARBONATE 800 MG PO TABS
2400.0000 mg | ORAL_TABLET | Freq: Three times a day (TID) | ORAL | Status: DC
  Administered 2023-10-13 – 2023-10-14 (×5): 2400 mg via ORAL
  Filled 2023-10-12 (×7): qty 3

## 2023-10-12 MED ORDER — LOSARTAN POTASSIUM 50 MG PO TABS
100.0000 mg | ORAL_TABLET | Freq: Every day | ORAL | Status: DC
  Filled 2023-10-12 (×2): qty 2

## 2023-10-12 MED ORDER — BUMETANIDE 2 MG PO TABS
2.0000 mg | ORAL_TABLET | Freq: Every day | ORAL | Status: DC
  Administered 2023-10-13 – 2023-10-14 (×2): 2 mg via ORAL
  Filled 2023-10-12 (×2): qty 1

## 2023-10-12 MED ORDER — CHLORHEXIDINE GLUCONATE CLOTH 2 % EX PADS: 6.0000 | MEDICATED_PAD | Freq: Every day | CUTANEOUS | Status: DC

## 2023-10-12 MED ORDER — LORATADINE 10 MG PO TABS
10.0000 mg | ORAL_TABLET | Freq: Once | ORAL | Status: AC
  Filled 2023-10-12: qty 1

## 2023-10-12 MED ORDER — CARVEDILOL 25 MG PO TABS
25.0000 mg | ORAL_TABLET | Freq: Two times a day (BID) | ORAL | Status: DC
  Administered 2023-10-13 – 2023-10-14 (×4): 25 mg via ORAL
  Filled 2023-10-12 (×3): qty 1
  Filled 2023-10-12: qty 2
  Filled 2023-10-12 (×2): qty 1

## 2023-10-12 MED ORDER — PANCRELIPASE (LIP-PROT-AMYL) 3000-9500 UNITS PO CPEP: ORAL_CAPSULE | Freq: Three times a day (TID) | ORAL | Status: DC

## 2023-10-12 MED ORDER — KETOROLAC TROMETHAMINE 30 MG/ML IJ SOLN
30.0000 mg | Freq: Once | INTRAMUSCULAR | Status: AC
  Filled 2023-10-12: qty 1

## 2023-10-12 MED ORDER — ACETAMINOPHEN 325 MG PO TABS: 650.0000 mg | ORAL_TABLET | Freq: Four times a day (QID) | ORAL | Status: DC | PRN

## 2023-10-12 NOTE — Consult Note (Signed)
 [facility]  INPATIENT CONSULTATION  Reason for Consultation: ESRD, volume overload Requesting Provider: Dr. Maralee Senate  HPI: Ashley Freeman is an 33 y.o. female with ESRD on HD, DM, HFrEF, anemia, depression, DM type 1, HL currently being admitted with CP and nephrology is consulted for comanagement of ESRD and assoc conditions.   Pt attend [facility] and due to childcare issues for her daughter says she misses and shortens treatments frequently.  She developed SSCP last night prompting presentation to [facility] ED.  She reports being 18kg over her EDW.  She denies f/c/palpitations.  She has abd disention without GI complaints - fluid.  She is being admitted.   PMH: Past Medical History:  Diagnosis Date   Abscess, gluteal, right 08/24/2013   Anemia 02/19/2012   Bartholin's gland abscess 09/19/2013   Bipolar disorder (HCC)    BV (bacterial vaginosis) 11/24/2015   Depression    Diabetes mellitus type I (HCC) 2001   Diagnosed at age 33 ; Type I   Diarrhea 05/30/2016   DKA (diabetic ketoacidoses) 08/19/2013   Also in 2018   ESRD (end stage renal disease) (HCC)    Gonorrhea 08/2011   Treated in 09/2011   HFrEF (heart failure with reduced ejection fraction) (HCC)    a. 2022 Echo: EF 40%; b. 10/2021 Echo: EF 55%; b. 07/2022 MV: No ischemia. EF 31%; c. 08/2022 Echo: EF 35%, mildly dil RV, sev TR.   History of trichomoniasis 05/31/2016   Hyperlipidemia 03/28/2016   Hypertension    NICM (nonischemic cardiomyopathy) (HCC)    Sepsis (HCC) 09/19/2013   PSH: Past Surgical History:  Procedure Laterality Date   A/V FISTULAGRAM Right 06/17/2023   Procedure: A/V Fistulagram;  Surgeon: Celso College, MD;  Location: [facility] INVASIVE CV LAB;  Service: Cardiovascular;  Laterality: Right;   A/V SHUNT INTERVENTION N/A 09/25/2023   Procedure: A/V SHUNT INTERVENTION;  Surgeon: Philipp Brawn, MD;  Location: [facility] PV LAB;  Service: Cardiovascular;  Laterality: N/A;   AV  FISTULA PLACEMENT Right 07/06/2022   Procedure: ARTERIOVENOUS GRAFT CREATION;  Surgeon: Young Hensen, MD;  Location: [facility] OR;  Service: Vascular;  Laterality: Right;   CESAREAN SECTION N/A 10/05/2019   Procedure: CESAREAN SECTION;  Surgeon: Raynell Caller, MD;  Location: [facility] LD ORS;  Service: Obstetrics;  Laterality: N/A;   CHOLECYSTECTOMY N/A 07/02/2023   Procedure: LAPAROSCOPIC CHOLECYSTECTOMY;  Surgeon: Enid Harry, MD;  Location: [facility] OR;  Service: General;  Laterality: N/A;   INCISION AND DRAINAGE ABSCESS Left 09/28/2019   Procedure: INCISION AND DRAINAGE VULVAR ABCESS;  Surgeon: Albino Hum, MD;  Location: [facility] OR;  Service: Gynecology;  Laterality: Left;   INCISION AND DRAINAGE PERIRECTAL ABSCESS Right 08/18/2013   Procedure: IRRIGATION AND DEBRIDEMENT GLUTEAL ABSCESS;  Surgeon: Shela Derby, MD;  Location: [facility] OR;  Service: General;  Laterality: Right;   INCISION AND DRAINAGE PERIRECTAL ABSCESS Right 09/19/2013   Procedure: IRRIGATION AND DEBRIDEMENT RIGHT GLUTEAL AND LABIAL ABSCESSES;  Surgeon: Shela Derby, MD;  Location: [facility] OR;  Service: General;  Laterality: Right;   INCISION AND DRAINAGE PERIRECTAL ABSCESS Right 09/24/2013   Procedure: IRRIGATION AND DEBRIDEMENT PERIRECTAL ABSCESS;  Surgeon: Diantha Fossa, MD;  Location: [facility] OR;  Service: General;  Laterality: Right;   IR PARACENTESIS  08/28/2023    Past Medical History:  Diagnosis Date   Abscess, gluteal, right 08/24/2013   Anemia 02/19/2012   Bartholin's gland abscess 09/19/2013   Bipolar disorder (HCC)    BV (bacterial vaginosis) 11/24/2015  Depression    Diabetes mellitus type I (HCC) 2001   Diagnosed at age 33 ; Type I   Diarrhea 05/30/2016   DKA (diabetic ketoacidoses) 08/19/2013   Also in 2018   ESRD (end stage renal disease) (HCC)    Gonorrhea 08/2011   Treated in 09/2011   HFrEF (heart failure with reduced ejection fraction) (HCC)    a. 2022 Echo: EF 40%; b. 10/2021 Echo: EF 55%; b. 07/2022 MV:  No ischemia. EF 31%; c. 08/2022 Echo: EF 35%, mildly dil RV, sev TR.   History of trichomoniasis 05/31/2016   Hyperlipidemia 03/28/2016   Hypertension    NICM (nonischemic cardiomyopathy) (HCC)    Sepsis (HCC) 09/19/2013    Medications:  I have reviewed the patient's current medications.  Medications Prior to Admission  Medication Sig Dispense Refill   acetaminophen  (TYLENOL ) 500 MG tablet Take 1,000 mg by mouth as needed for mild pain (pain score 1-3) or moderate pain (pain score 4-6).     albuterol  (VENTOLIN  HFA) 108 (90 Base) MCG/ACT inhaler Inhale 2 puffs into the lungs every 4 (four) hours as needed for wheezing or shortness of breath.     bumetanide (BUMEX) 2 MG tablet Take 2 mg by mouth daily.     calcitRIOL  (ROCALTROL ) 0.25 MCG capsule Take 5 capsules (1.25 mcg total) by mouth every Tuesday, Thursday, and Saturday at 6 PM. 30 capsule 0   carvedilol  (COREG ) 25 MG tablet Take 25 mg by mouth 2 (two) times daily with a meal.     cyclobenzaprine  (FLEXERIL ) 5 MG tablet Take 1 tablet (5 mg total) by mouth 3 (three) times daily as needed for muscle spasms. 30 tablet 0   DULoxetine  (CYMBALTA ) 20 MG capsule Take 20 mg by mouth daily.     famotidine  (PEPCID ) 20 MG tablet Take 20 mg by mouth 2 (two) times daily.     hydrOXYzine  (ATARAX ) 25 MG tablet Take 25 mg by mouth every 6 (six) hours as needed for anxiety or itching.     insulin  aspart (NOVOLOG ) 100 UNIT/ML injection Inject 5 Units into the skin 3 (three) times daily as needed for high blood sugar.     lamoTRIgine  (LAMICTAL ) 200 MG tablet Take 200 mg by mouth daily.     LANTUS  SOLOSTAR 100 UNIT/ML Solostar Pen Inject 10 Units into the skin at bedtime.     LORazepam  (ATIVAN ) 1 MG tablet Take 1 mg by mouth daily as needed.     losartan  (COZAAR ) 100 MG tablet Take 150 mg by mouth daily.     methylphenidate 18 MG PO CR tablet Take 18 mg by mouth every morning.     OLANZapine zydis (ZYPREXA) 5 MG disintegrating tablet Take 5 mg by mouth at  bedtime.     ondansetron  (ZOFRAN -ODT) 4 MG disintegrating tablet Take 1 tablet (4 mg total) by mouth every 8 (eight) hours as needed for nausea or vomiting. 10 tablet 0   Pancrelipase, Lip-Prot-Amyl, 3000-9500 units CPEP Take 3,000 units of lipase by mouth 3 (three) times daily.     prochlorperazine  (COMPAZINE ) 5 MG tablet Take 5 mg by mouth every 6 (six) hours as needed for nausea.     rosuvastatin  (CRESTOR ) 40 MG tablet Take 40 mg by mouth daily.     sevelamer  carbonate (RENVELA ) 800 MG tablet Take 2,400 mg by mouth 3 (three) times daily with meals.     sodium bicarbonate  650 MG tablet Take 1,300 mg by mouth 2 (two) times daily.     SUMAtriptan  (  IMITREX ) 50 MG tablet Take 50 mg by mouth every 2 (two) hours as needed for migraine or headache.     Continuous Glucose Sensor (DEXCOM G7 SENSOR) MISC Change sensors every 10 days 3 each 3   dicyclomine  (BENTYL ) 20 MG tablet Take 1 tablet (20 mg total) by mouth 2 (two) times daily for 10 days. (Patient not taking: Reported on 10/12/2023) 20 tablet 0   HYDROcodone -acetaminophen  (NORCO/VICODIN) 5-325 MG tablet Take 1 tablet by mouth every 6 (six) hours as needed for moderate pain (pain score 4-6). (Patient not taking: Reported on 10/12/2023) 15 tablet 0   HYDROmorphone  (DILAUDID ) 2 MG tablet Take 2 mg by mouth every 4 (four) hours as needed for moderate pain (pain score 4-6) or severe pain (pain score 7-10). (Patient not taking: Reported on 10/12/2023)     predniSONE  (DELTASONE ) 20 MG tablet Take 2 tablets (40 mg total) by mouth daily. (Patient not taking: Reported on 10/12/2023) 10 tablet 0    ALLERGIES:   Allergies  Allergen Reactions   Cephalexin  Anaphylaxis    Has gotten ceftriaxone  in the past    Morphine  Itching   Penicillins Hives and Rash   Fish Allergy    Benadryl  [Diphenhydramine ] Itching   Dilaudid  [Hydromorphone ] Itching   Doxycycline  Itching   Methotrexate Derivatives Rash   Oxycodone  Itching    FAM HX: Family History  Problem  Relation Age of Onset   Asthma Mother    Carpal tunnel syndrome Mother    Gout Father    Diabetes Paternal Grandmother    Anesthesia problems Neg Hx     Social History:   reports that she has never smoked. She has never been exposed to tobacco smoke. She has never used smokeless tobacco. She reports that she does not currently use alcohol. She reports that she does not use drugs.  ROS: 12 system ROS neg except per HPI  Blood pressure (!) 129/98, pulse 92, temperature 99.1 F (37.3 C), temperature source Oral, resp. rate 18, SpO2 95%. PHYSICAL EXAM: Gen: chronically ill but nontoxic  Eyes: EOMI ENT: MMM Neck: JVD to ear at 30 degrees CV: RRR Abd: distended, fluid wave, nontender Lungs: dec BS bases Extr: 2+ LE edema Neuro:conversant HD access: RUE AVG +t/b   Results for orders placed or performed during the hospital encounter of 10/12/23 (from the past 48 hours)  CBC with Differential     Status: Abnormal   Collection Time: 10/12/23  4:17 AM  Result Value Ref Range   WBC 10.6 (H) 4.0 - 10.5 K/uL   RBC 3.88 3.87 - 5.11 MIL/uL   Hemoglobin 10.8 (L) 12.0 - 15.0 g/dL   HCT 28.4 (L) 13.2 - 44.0 %   MCV 88.1 80.0 - 100.0 fL   MCH 27.8 26.0 - 34.0 pg   MCHC 31.6 30.0 - 36.0 g/dL   RDW 10.2 72.5 - 36.6 %   Platelets 191 150 - 400 K/uL    Comment: REPEATED TO VERIFY   nRBC 0.0 0.0 - 0.2 %   Neutrophils Relative % 54 %   Neutro Abs 5.8 1.7 - 7.7 K/uL   Lymphocytes Relative 30 %   Lymphs Abs 3.2 0.7 - 4.0 K/uL   Monocytes Relative 11 %   Monocytes Absolute 1.1 (H) 0.1 - 1.0 K/uL   Eosinophils Relative 2 %   Eosinophils Absolute 0.3 0.0 - 0.5 K/uL   Basophils Relative 1 %   Basophils Absolute 0.1 0.0 - 0.1 K/uL   Immature Granulocytes 2 %  Abs Immature Granulocytes 0.20 (H) 0.00 - 0.07 K/uL    Comment: Performed at [facility], [address], [place], [place] [address]  Basic metabolic panel     Status: Abnormal   Collection Time: 10/12/23  4:17 AM  Result Value  Ref Range   Sodium 135 135 - 145 mmol/L   Potassium 3.6 3.5 - 5.1 mmol/L   Chloride 98 98 - 111 mmol/L   CO2 21 (L) 22 - 32 mmol/L   Glucose, Bld 348 (H) 70 - 99 mg/dL    Comment: Glucose reference range applies only to samples taken after fasting for at least 8 hours.   BUN 42 (H) 6 - 20 mg/dL   Creatinine, Ser 6.04 (H) 0.44 - 1.00 mg/dL   Calcium  7.9 (L) 8.9 - 10.3 mg/dL   GFR, Estimated 7 (L) >60 mL/min    Comment: (NOTE) Calculated using the CKD-EPI Creatinine Equation (2021)    Anion gap 16 (H) 5 - 15    Comment: Performed at [facility], [address] [place], [place] [address]  Troponin I (High Sensitivity)     Status: Abnormal   Collection Time: 10/12/23  4:17 AM  Result Value Ref Range   Troponin I (High Sensitivity) 19 (H) <18 ng/L    Comment: (NOTE) Elevated high sensitivity troponin I (hsTnI) values and significant  changes across serial measurements may suggest ACS but many other  chronic and acute conditions are known to elevate hsTnI results.  Refer to the Links section for chest pain algorithms and additional  guidance. Performed at [facility], [address], [place], [place] [address]   hCG, serum, qualitative     Status: None   Collection Time: 10/12/23  4:17 AM  Result Value Ref Range   Preg, Serum NEGATIVE NEGATIVE    Comment:        THE SENSITIVITY OF THIS METHODOLOGY IS >10 mIU/mL. Performed at [facility], [address] [place], [place] [address]   Brain natriuretic peptide     Status: Abnormal   Collection Time: 10/12/23  5:00 AM  Result Value Ref Range   B Natriuretic Peptide >4,500.0 (H) 0.0 - 100.0 pg/mL    Comment: Performed at [facility], [address], [place], [place] [address]  Troponin I (High Sensitivity)     Status: Abnormal   Collection Time: 10/12/23  6:24 AM  Result Value Ref Range   Troponin I (High Sensitivity) 19 (H) <18 ng/L    Comment: (NOTE) Elevated high sensitivity troponin I (hsTnI)  values and significant  changes across serial measurements may suggest ACS but many other  chronic and acute conditions are known to elevate hsTnI results.  Refer to the Links section for chest pain algorithms and additional  guidance. Performed at [facility], [address] [place], [place] [address]   Hepatitis B surface antigen     Status: None   Collection Time: 10/12/23  7:15 AM  Result Value Ref Range   Hepatitis B Surface Ag NON REACTIVE NON REACTIVE    Comment: Performed at [facility], [address], [place], [place] [address]  Beta-hydroxybutyric acid     Status: Abnormal   Collection Time: 10/12/23  8:30 AM  Result Value Ref Range   Beta-Hydroxybutyric Acid <0.05 (L) 0.05 - 0.27 mmol/L    Comment: Performed at [facility], [address], [place], [place] [address]  CBG monitoring,  ED     Status: Abnormal   Collection Time: 10/12/23  8:57 AM  Result Value Ref Range   Glucose-Capillary 211 (H) 70 - 99 mg/dL    Comment: Glucose reference range applies only to samples taken after fasting for at least 8 hours.    DG Chest Portable 1 View Result Date: 10/12/2023 EXAM: 1 VIEW XRAY OF THE CHEST 10/12/2023 04:26:00 AM COMPARISON: 2-view chest x-ray 10/05/2023 CLINICAL HISTORY: Chest pain. Shortness of breath. Dialysis patient. FINDINGS: LUNGS AND PLEURA: No focal pulmonary opacity. No pulmonary edema. No pleural effusion. No pneumothorax. HEART AND MEDIASTINUM: Cardiomegaly is stable. BONES AND SOFT TISSUES: No acute osseous abnormality. IMPRESSION: 1. No acute findings. 2. Stable cardiomegaly. Electronically signed by: Audree Leas MD 10/12/2023 04:31 AM EDT RP Workstation: [id number]    Assessment/Plan Ashley Freeman is an 33 y.o. female with ESRD on HD, DM, HFrEF, anemia, depression, DM type 1, HL currently being admitted with CP and nephrology is consulted for comanagement of ESRD and assoc conditions.   **Acute on chronic HFrEF: in  the setting of poor dialysis attendance.  HD today, serial HD to lower volume but next will be Monday.  Will try UF 5L today.    **ESRD on HD: as above poor attendance.  Involve SW as able but has SW support at HD unit as well.   **Anemia of CKD: Hb 10.8  **Secondary hyperparathyroidism:  cont outpt binders, calcitriol . Renal diet.   **HTN/volume: cont home meds, UF with HD, BPs currently reasonable.  **DM type 1: meds per primary  Will follow, reach out with concerns.   Baron Border 10/12/2023, 11:27 AM

## 2023-10-12 NOTE — H&P (Signed)
 History and Physical    Patient: Ashley Freeman ZOX:[id number] DOB: 04/19/1991 DOA: 10/12/2023 DOS: the patient was seen and examined on 10/12/2023 PCP: Skip Dull Pap, MD  Patient coming from: Home  Chief Complaint:  Chief Complaint  Patient presents with   Chest Pain   Abdominal Pain   HPI: Ashley Freeman is a 33 y.o. female with medical history significant of ESRD HD TTS, HFrEF (EF 30-35% in 08/2023), DM1, prolonged QTc, and bipolar disorder p/w chest pain c/b SOB iso volume overload 2/2 ADHF and ESRD.  Pt states that she was in her USOH until yesterday evening when she experienced cp while watching TV. When she felt the pain, she waited for an hour before her aunt called EMS. Pt denies jaw claudication, R shoulder radiation, or sweating/claminess during this episode. She did have some mild abdominal pain. Of note, pt did not receive HD this past Tuesday and was only able to receive 1/2 a session on Thursday. She notes that her dry weight is 57kg and that most recently her post-HD weight was 66kg on Thursday.  In the ED, pt tachycardic, hypertensive, and tachypneic on RA. Labs notable for BUN/Cr 42/7.74, anion gap 16, beta-hydroxybuterate <0.05, BNP >4500, troponin 19-->20, and glucose 348. Pt admitted to medicine with nephrology following for HD.  Review of Systems: As mentioned in the history of present illness. All other systems reviewed and are negative. Past Medical History:  Diagnosis Date   Abscess, gluteal, right 08/24/2013   Anemia 02/19/2012   Bartholin's gland abscess 09/19/2013   Bipolar disorder (HCC)    BV (bacterial vaginosis) 11/24/2015   Depression    Diabetes mellitus type I (HCC) 2001   Diagnosed at age 38 ; Type I   Diarrhea 05/30/2016   DKA (diabetic ketoacidoses) 08/19/2013   Also in 2018   ESRD (end stage renal disease) (HCC)    Gonorrhea 08/2011   Treated in 09/2011   HFrEF (heart failure with reduced ejection fraction)  (HCC)    a. 2022 Echo: EF 40%; b. 10/2021 Echo: EF 55%; b. 07/2022 MV: No ischemia. EF 31%; c. 08/2022 Echo: EF 35%, mildly dil RV, sev TR.   History of trichomoniasis 05/31/2016   Hyperlipidemia 03/28/2016   Hypertension    NICM (nonischemic cardiomyopathy) (HCC)    Sepsis (HCC) 09/19/2013   Past Surgical History:  Procedure Laterality Date   A/V FISTULAGRAM Right 06/17/2023   Procedure: A/V Fistulagram;  Surgeon: Celso College, MD;  Location: [facility] INVASIVE CV LAB;  Service: Cardiovascular;  Laterality: Right;   A/V SHUNT INTERVENTION N/A 09/25/2023   Procedure: A/V SHUNT INTERVENTION;  Surgeon: Philipp Brawn, MD;  Location: [facility];  Service: Cardiovascular;  Laterality: N/A;   AV FISTULA PLACEMENT Right 07/06/2022   Procedure: ARTERIOVENOUS GRAFT CREATION;  Surgeon: Young Hensen, MD;  Location: [facility] OR;  Service: Vascular;  Laterality: Right;   CESAREAN SECTION N/A 10/05/2019   Procedure: CESAREAN SECTION;  Surgeon: Raynell Caller, MD;  Location: [facility] LD ORS;  Service: Obstetrics;  Laterality: N/A;   CHOLECYSTECTOMY N/A 07/02/2023   Procedure: LAPAROSCOPIC CHOLECYSTECTOMY;  Surgeon: Enid Harry, MD;  Location: [facility] OR;  Service: General;  Laterality: N/A;   INCISION AND DRAINAGE ABSCESS Left 09/28/2019   Procedure: INCISION AND DRAINAGE VULVAR ABCESS;  Surgeon: Albino Hum, MD;  Location: [facility] OR;  Service: Gynecology;  Laterality: Left;   INCISION AND DRAINAGE PERIRECTAL ABSCESS Right 08/18/2013   Procedure: IRRIGATION AND DEBRIDEMENT GLUTEAL ABSCESS;  Surgeon: Shela Derby,  MD;  Location: [facility] OR;  Service: General;  Laterality: Right;   INCISION AND DRAINAGE PERIRECTAL ABSCESS Right 09/19/2013   Procedure: IRRIGATION AND DEBRIDEMENT RIGHT GLUTEAL AND LABIAL ABSCESSES;  Surgeon: Shela Derby, MD;  Location: [facility] OR;  Service: General;  Laterality: Right;   INCISION AND DRAINAGE PERIRECTAL ABSCESS Right 09/24/2013   Procedure: IRRIGATION AND DEBRIDEMENT PERIRECTAL  ABSCESS;  Surgeon: Diantha Fossa, MD;  Location: [facility] OR;  Service: General;  Laterality: Right;   IR PARACENTESIS  08/28/2023   Social History:  reports that she has never smoked. She has never been exposed to tobacco smoke. She has never used smokeless tobacco. She reports that she does not currently use alcohol. She reports that she does not use drugs.  Allergies  Allergen Reactions   Cephalexin  Anaphylaxis    Has gotten ceftriaxone  in the past    Morphine  Itching   Penicillins Hives and Rash   Fish Allergy    Benadryl  [Diphenhydramine ] Itching   Dilaudid  [Hydromorphone ] Itching   Doxycycline  Itching   Methotrexate Derivatives Rash   Oxycodone  Itching    Family History  Problem Relation Age of Onset   Asthma Mother    Carpal tunnel syndrome Mother    Gout Father    Diabetes Paternal Grandmother    Anesthesia problems Neg Hx     Prior to Admission medications   Medication Sig Start Date End Date Taking? Authorizing Provider  acetaminophen  (TYLENOL ) 500 MG tablet Take 1,000 mg by mouth as needed for mild pain (pain score 1-3) or moderate pain (pain score 4-6).   Yes [provider]  albuterol  (VENTOLIN  HFA) 108 (90 Base) MCG/ACT inhaler Inhale 2 puffs into the lungs every 4 (four) hours as needed for wheezing or shortness of breath. 11/24/22  Yes [provider]  bumetanide (BUMEX) 2 MG tablet Take 2 mg by mouth daily.   Yes [provider]  calcitRIOL  (ROCALTROL ) 0.25 MCG capsule Take 5 capsules (1.25 mcg total) by mouth every Tuesday, Thursday, and Saturday at 6 PM. 07/09/23  Yes Oral Billings, MD  carvedilol  (COREG ) 25 MG tablet Take 25 mg by mouth 2 (two) times daily with a meal. 05/15/23  Yes [provider]  cyclobenzaprine  (FLEXERIL ) 5 MG tablet Take 1 tablet (5 mg total) by mouth 3 (three) times daily as needed for muscle spasms. 08/31/23  Yes Ghimire, Estil Heman, MD  DULoxetine  (CYMBALTA ) 20 MG capsule Take 20 mg by mouth daily. 07/29/23  Yes  [provider]  famotidine  (PEPCID ) 20 MG tablet Take 20 mg by mouth 2 (two) times daily. 07/29/23  Yes [provider]  hydrOXYzine  (ATARAX ) 25 MG tablet Take 25 mg by mouth every 6 (six) hours as needed for anxiety or itching. 05/14/23  Yes [provider]  insulin  aspart (NOVOLOG ) 100 UNIT/ML injection Inject 5 Units into the skin 3 (three) times daily as needed for high blood sugar.   Yes [provider]  lamoTRIgine  (LAMICTAL ) 200 MG tablet Take 200 mg by mouth daily. 07/11/23  Yes [provider]  LANTUS  SOLOSTAR 100 UNIT/ML Solostar Pen Inject 10 Units into the skin at bedtime.   Yes [provider]  LORazepam  (ATIVAN ) 1 MG tablet Take 1 mg by mouth daily as needed. 07/11/23  Yes [provider]  losartan  (COZAAR ) 100 MG tablet Take 150 mg by mouth daily. 03/25/23  Yes [provider]  methylphenidate 18 MG PO CR tablet Take 18 mg by mouth every morning. 08/21/23  Yes [provider]  OLANZapine zydis (ZYPREXA) 5 MG disintegrating tablet Take 5 mg by mouth at bedtime. 09/23/23 12/22/23 Yes [provider]  ondansetron  (ZOFRAN -ODT) 4 MG disintegrating tablet Take 1 tablet (4 mg total) by mouth every 8 (eight) hours as needed for nausea or vomiting. 06/23/23  Yes Davis, Jonathon H, MD  Pancrelipase, Lip-Prot-Amyl, 3000-9500 units CPEP Take 3,000 units of lipase by mouth 3 (three) times daily. 09/23/23 12/22/23 Yes [provider]  prochlorperazine  (COMPAZINE ) 5 MG tablet Take 5 mg by mouth every 6 (six) hours as needed for nausea. 09/23/23  Yes [provider]  rosuvastatin  (CRESTOR ) 40 MG tablet Take 40 mg by mouth daily. 05/22/22  Yes [provider]  sevelamer  carbonate (RENVELA ) 800 MG tablet Take 2,400 mg by mouth 3 (three) times daily with meals.   Yes [provider]  sodium bicarbonate  650 MG tablet Take 1,300 mg by mouth 2 (two) times daily. 07/29/23  Yes [provider]  SUMAtriptan  (IMITREX ) 50 MG tablet Take 50 mg by mouth every 2 (two) hours as needed for migraine or headache. 06/20/22  Yes [provider]  Continuous Glucose Sensor (DEXCOM G7 SENSOR) MISC Change sensors every 10 days 09/07/22   Shamleffer, Ibtehal Jaralla, MD  dicyclomine  (BENTYL ) 20 MG tablet Take 1 tablet (20 mg total) by mouth 2 (two) times daily for 10 days. Patient not taking: Reported on 10/12/2023 08/31/23 09/10/23  Burton Casey, MD  HYDROcodone -acetaminophen  (NORCO/VICODIN) 5-325 MG tablet Take 1 tablet by mouth every 6 (six) hours as needed for moderate pain (pain score 4-6). Patient not taking: Reported on 10/12/2023 08/31/23   Burton Casey, MD  HYDROmorphone  (DILAUDID ) 2 MG tablet Take 2 mg by mouth every 4 (four) hours as needed for moderate pain (pain score 4-6) or severe pain (pain score 7-10). Patient not taking: Reported on 10/12/2023 09/23/23   [provider]  predniSONE  (DELTASONE ) 20 MG tablet Take 2 tablets (40 mg total) by mouth daily. Patient not taking: Reported on 10/12/2023 09/10/23   Afton Albright, MD    Physical Exam: Vitals:   10/12/23 1000 10/12/23 1127 10/12/23 1306 10/12/23 1311  BP:  (!) 145/96  109/71  Pulse: 92 83  78  Resp: 18 20  19   Temp:  98.2 F (36.8 C) (!) 97.2 F (36.2 C)   TempSrc:  Oral    SpO2: 95% 99%  99%  Weight:  68.6 kg  69.3 kg  Height:  5' 3 (1.6 m)     General: Alert, oriented x3, resting comfortably in no acute distress Respiratory: Lungs clear to auscultation bilaterally with normal respiratory effort; no w/r/r Cardiovascular: Regular rate and rhythm w/o m/r/g   Data Reviewed:  Lab Results  Component Value Date   WBC 10.6 (H) 10/12/2023   HGB 10.8 (L) 10/12/2023   HCT 34.2 (L) 10/12/2023   MCV 88.1 10/12/2023   PLT 191 10/12/2023   Lab Results  Component Value Date   GLUCOSE 348 (H) 10/12/2023   CALCIUM  7.9 (L) 10/12/2023   NA 135 10/12/2023   K 3.6 10/12/2023   CO2 21 (L)  10/12/2023   CL 98 10/12/2023   BUN 42 (H) 10/12/2023   CREATININE 7.74 (H) 10/12/2023   Lab Results  Component Value Date   ALT 58 (H) 10/05/2023   AST 93 (H) 10/05/2023   ALKPHOS 496 (H) 10/05/2023   BILITOT 0.9 10/05/2023   Lab Results  Component Value Date   INR 1.4 (H) 07/20/2023  INR 1.1 04/24/2021   INR 1.2 04/08/2021    Radiology: DG Chest Portable 1 View Result Date: 10/12/2023 EXAM: 1 VIEW XRAY OF THE CHEST 10/12/2023 04:26:00 AM COMPARISON: 2-view chest x-ray 10/05/2023 CLINICAL HISTORY: Chest pain. Shortness of breath. Dialysis patient. FINDINGS: LUNGS AND PLEURA: No focal pulmonary opacity. No pulmonary edema. No pleural effusion. No pneumothorax. HEART AND MEDIASTINUM: Cardiomegaly is stable. BONES AND SOFT TISSUES: No acute osseous abnormality. IMPRESSION: 1. No acute findings. 2. Stable cardiomegaly. Electronically signed by: Audree Leas MD 10/12/2023 04:31 AM EDT RP Workstation: [id number]    Assessment and Plan: 4F h/o ESRD HD TTS, HFrEF (EF 30-35% in 08/2023), DM1, prolonged QTc, and bipolar disorder p/w chest pain c/b SOB iso volume overload 2/2 ADHF and ESRD.  Chest pain c/b SOB iso volume overload 22/ ADHF and ESRD -Nephrology consulted; apprec eval/recs -PTA calcitriol , Renvela , and sodium bicarbonate   HFrEF exacerbation c/b ESRD -PTA bumex 2mg  dialy -PTA coreg  BID, losartan  daily, and crestor  daily  DM1 -Semglee  10U daily + SSI TID AC prn  BPD -PTA olanzapine, lamictal  and Cymbalta    Advance Care Planning:   Code Status: Full Code   Consults: Nephrology  Family Communication: N/A  Severity of Illness: The appropriate patient status for this patient is INPATIENT. Inpatient status is judged to be reasonable and necessary in order to provide the required intensity of service to ensure the patient's safety. The patient's presenting symptoms, physical exam findings, and initial radiographic and laboratory data in the context of their chronic  comorbidities is felt to place them at high risk for further clinical deterioration. Furthermore, it is not anticipated that the patient will be medically stable for discharge from the hospital within 2 midnights of admission.   * I certify that at the point of admission it is my clinical judgment that the patient will require inpatient hospital care spanning beyond 2 midnights from the point of admission due to high intensity of service, high risk for further deterioration and high frequency of surveillance required.*   ------- I spent 55 minutes reviewing previous labs/notes, obtaining separate history at the bedside, counseling/discussing the treatment plan outlined above, ordering medications/tests, and performing clinical documentation.  Author: Arne Langdon, MD 10/12/2023 1:26 PM  For on call review [email or web address].

## 2023-10-12 NOTE — Progress Notes (Signed)
  Carryover admission to the Day Admitter.  I discussed this case with the EDP, Dr. Palumbo.  Per these discussions:   This is a 33 year old female with history of end-stage renal disease on hemodialysis on Tuesday, Thursday, Saturday, chronic diastolic heart failure, who is being admitted with acute on chronic diastolic heart failure as well as atypical will chest pain, after presenting with such as well as shortness of breath.  Has not missed any recent dialysis sessions, although she is reported to be 18 kg above her dry weight.  BNP is reported to be elevated relative to baseline.  Initial high sensitive troponin I was 19, relative to most recent prior value of 20 when checked last month.  Repeat troponin level is currently pending.  EKGs report showed no evidence of acute ischemic changes, including no evidence of STEMI.  Chest x-ray is reported to cardiomegaly, without any significant acute findings.  EDP is contacting on-call nephrology at this time to request consultation as well as nonemergent hemodialysis.  I have placed an order for observation to PCU for further evaluation management of the above.  I have placed some additional preliminary admit orders via the adult multi-morbid admission order set. I have also ordered prn IV fentanyl .    Camelia Cavalier, DO Hospitalist

## 2023-10-12 NOTE — ED Notes (Signed)
 IV team consulted due to multiple unsuccessful venipunctures .

## 2023-10-12 NOTE — Progress Notes (Signed)
 [facility] night cross cover note:   I was notified by the patient's RN of the patient's request for IV Dilaudid  in the setting of abdominal pain, which is reported to be similar to abdominal pain experienced earlier today.  I subsequently ordered Dilaudid  0.5 mg IV x 1 dose now.     Camelia Cavalier, DO Hospitalist

## 2023-10-12 NOTE — ED Triage Notes (Signed)
 Per EMS, Pt has had abd and chest pain for the past couple of hours.  She is a dialysis pt who had 1/2 of dialysis session on Thursday and is due for another treatment on Saturday.  Pt also c/o SOB.    158/100 HR 102 98% RA CBG 139  RR26  Pt states she is 8kl over dry weight

## 2023-10-12 NOTE — ED Notes (Signed)
 Provider Sulema Endo MD contact for 8/10 chest/abd pain, a repeat of pt's symptoms coming into ed. Pharmacy contact for verification of orders.

## 2023-10-12 NOTE — ED Notes (Signed)
 Got patient on the bedside toilet patient has call bell in reach put patient some hospital socks on

## 2023-10-12 NOTE — Progress Notes (Signed)
 Patient A&O x 4, room air, transported to HD via bed.

## 2023-10-12 NOTE — ED Provider Notes (Signed)
 [place] EMERGENCY DEPARTMENT AT [facility] Provider Note   CSN: [id number] Arrival date & time: 10/12/23  9562     Patient presents with: Chest Pain and Abdominal Pain   Ashley Freeman is a 33 y.o. female.   The history is provided by the patient.  Chest Pain Pain location:  Substernal area Pain radiates to:  Does not radiate Pain severity:  Moderate Onset quality:  Gradual Timing:  Constant Progression:  Unchanged Chronicity:  New Context: at rest   Relieved by:  Nothing Worsened by:  Nothing Ineffective treatments:  None tried Associated symptoms: abdominal pain   Associated symptoms: no fever   Risk factors: hypertension   Risk factors: no aortic disease   Abdominal Pain Pain location:  Generalized Pain quality: not aching   Pain radiates to:  Does not radiate Pain severity:  Severe Timing:  Constant Progression:  Worsening Chronicity:  New Context: not alcohol use   Relieved by:  Nothing Worsened by:  Nothing Ineffective treatments:  None tried Associated symptoms: chest pain   Associated symptoms: no fever   Risk factors: no aspirin  use and not elderly   Patient with ESRD TTS presents with weight up 18.  Not a full load taken off on Thursday.     Past Medical History:  Diagnosis Date   Abscess, gluteal, right 08/24/2013   Anemia 02/19/2012   Bartholin's gland abscess 09/19/2013   Bipolar disorder (HCC)    BV (bacterial vaginosis) 11/24/2015   Depression    Diabetes mellitus type I (HCC) 2001   Diagnosed at age 40 ; Type I   Diarrhea 05/30/2016   DKA (diabetic ketoacidoses) 08/19/2013   Also in 2018   ESRD (end stage renal disease) (HCC)    Gonorrhea 08/2011   Treated in 09/2011   HFrEF (heart failure with reduced ejection fraction) (HCC)    a. 2022 Echo: EF 40%; b. 10/2021 Echo: EF 55%; b. 07/2022 MV: No ischemia. EF 31%; c. 08/2022 Echo: EF 35%, mildly dil RV, sev TR.   History of trichomoniasis 05/31/2016    Hyperlipidemia 03/28/2016   Hypertension    NICM (nonischemic cardiomyopathy) (HCC)    Sepsis (HCC) 09/19/2013     Prior to Admission medications   Medication Sig Start Date End Date Taking? Authorizing Provider  acetaminophen  (TYLENOL ) 500 MG tablet Take 1,000 mg by mouth as needed for mild pain (pain score 1-3) or moderate pain (pain score 4-6).    [provider]  albuterol  (VENTOLIN  HFA) 108 (90 Base) MCG/ACT inhaler Inhale 2 puffs into the lungs every 4 (four) hours as needed for wheezing or shortness of breath. 11/24/22   [provider]  bumetanide (BUMEX) 2 MG tablet Take 2 mg by mouth daily.    [provider]  calcitRIOL  (ROCALTROL ) 0.25 MCG capsule Take 5 capsules (1.25 mcg total) by mouth every Tuesday, Thursday, and Saturday at 6 PM. 07/09/23   Oral Billings, MD  carvedilol  (COREG ) 25 MG tablet Take 25 mg by mouth 2 (two) times daily with a meal. 05/15/23   [provider]  Continuous Glucose Sensor (DEXCOM G7 SENSOR) MISC Change sensors every 10 days 09/07/22   Shamleffer, Ibtehal Jaralla, MD  cyclobenzaprine  (FLEXERIL ) 5 MG tablet Take 1 tablet (5 mg total) by mouth 3 (three) times daily as needed for muscle spasms. 08/31/23   Ghimire, Estil Heman, MD  dicyclomine  (BENTYL ) 20 MG tablet Take 1 tablet (20 mg total) by mouth 2 (two) times daily for 10  days. 08/31/23 09/10/23  Burton Casey, MD  DULoxetine  (CYMBALTA ) 20 MG capsule Take 20 mg by mouth daily. 07/29/23   [provider]  famotidine  (PEPCID ) 20 MG tablet Take 20 mg by mouth 2 (two) times daily. 07/29/23   [provider]  HYDROcodone -acetaminophen  (NORCO/VICODIN) 5-325 MG tablet Take 1 tablet by mouth every 6 (six) hours as needed for moderate pain (pain score 4-6). 08/31/23   Ghimire, Estil Heman, MD  hydrocortisone  2.5 % cream Apply 1 Application topically daily as needed. 12/28/22 12/28/23  [provider]  hydrOXYzine  (ATARAX ) 25 MG tablet Take 25 mg by mouth every 6  (six) hours as needed for anxiety or itching. 05/14/23   [provider]  insulin  aspart (NOVOLOG ) 100 UNIT/ML injection Inject 5 Units into the skin 3 (three) times daily as needed for high blood sugar.    [provider]  lamoTRIgine  (LAMICTAL ) 200 MG tablet Take 200 mg by mouth daily. 07/11/23   [provider]  LANTUS  SOLOSTAR 100 UNIT/ML Solostar Pen Inject 10 Units into the skin at bedtime.    [provider]  LORazepam  (ATIVAN ) 1 MG tablet Take 1 mg by mouth daily as needed. 07/11/23   [provider]  losartan  (COZAAR ) 100 MG tablet Take 150 mg by mouth daily. 03/25/23   [provider]  methylphenidate 18 MG PO CR tablet Take 18 mg by mouth every morning. 08/21/23   [provider]  ondansetron  (ZOFRAN -ODT) 4 MG disintegrating tablet Take 1 tablet (4 mg total) by mouth every 8 (eight) hours as needed for nausea or vomiting. 06/23/23   Davis, Jonathon H, MD  polyethylene glycol (MIRALAX  / GLYCOLAX ) 17 g packet Take 17 g by mouth daily as needed for mild constipation. 12/07/22   [provider]  predniSONE  (DELTASONE ) 20 MG tablet Take 2 tablets (40 mg total) by mouth daily. 09/10/23   Afton Albright, MD  rosuvastatin  (CRESTOR ) 40 MG tablet Take 40 mg by mouth daily. 05/22/22   [provider]  sevelamer  carbonate (RENVELA ) 800 MG tablet Take 2,400 mg by mouth 3 (three) times daily with meals.    [provider]  sodium bicarbonate  650 MG tablet Take 1,300 mg by mouth 2 (two) times daily. 07/29/23   [provider]  SUMAtriptan  (IMITREX ) 50 MG tablet Take 50 mg by mouth every 2 (two) hours as needed for migraine or headache. 06/20/22   [provider]    Allergies: Cephalexin , Morphine , Penicillins, Fish allergy, Benadryl  [diphenhydramine ], Dilaudid  [hydromorphone ], Doxycycline , Methotrexate derivatives, and Oxycodone     Review of Systems  Constitutional:  Negative for fever.  HENT:  Negative  for facial swelling.   Eyes:  Negative for redness.  Cardiovascular:  Positive for chest pain.  Gastrointestinal:  Positive for abdominal pain.  All other systems reviewed and are negative.   Updated Vital Signs BP (!) 153/98   Pulse 99   Temp 98.7 F (37.1 C) (Oral)   Resp (!) 28   LMP  (LMP Unknown)   SpO2 95%   Physical Exam Vitals and nursing note reviewed.  Constitutional:      General: She is not in acute distress.    Appearance: Normal appearance. She is well-developed.  HENT:     Head: Normocephalic and atraumatic.     Nose: Nose normal.   Eyes:     Pupils: Pupils are equal, round, and reactive to light.    Cardiovascular:     Rate and Rhythm: Normal rate and regular  rhythm.     Pulses: Normal pulses.     Heart sounds: Normal heart sounds.  Pulmonary:     Effort: Pulmonary effort is normal. No respiratory distress.     Breath sounds: Normal breath sounds.  Abdominal:     General: Bowel sounds are normal. There is no distension.     Palpations: Abdomen is soft.     Tenderness: There is no abdominal tenderness. There is no guarding or rebound.   Musculoskeletal:        General: Normal range of motion.     Cervical back: Normal range of motion and neck supple.   Skin:    General: Skin is warm and dry.     Capillary Refill: Capillary refill takes less than 2 seconds.     Findings: No erythema or rash.   Neurological:     General: No focal deficit present.     Mental Status: She is alert and oriented to person, place, and time.     Deep Tendon Reflexes: Reflexes normal.   Psychiatric:        Mood and Affect: Mood normal.     (all labs ordered are listed, but only abnormal results are displayed) Results for orders placed or performed during the hospital encounter of 10/12/23  CBC with Differential   Collection Time: 10/12/23  4:17 AM  Result Value Ref Range   WBC 10.6 (H) 4.0 - 10.5 K/uL   RBC 3.88 3.87 - 5.11 MIL/uL   Hemoglobin 10.8 (L) 12.0 -  15.0 g/dL   HCT 57.8 (L) 46.9 - 62.9 %   MCV 88.1 80.0 - 100.0 fL   MCH 27.8 26.0 - 34.0 pg   MCHC 31.6 30.0 - 36.0 g/dL   RDW 52.8 41.3 - 24.4 %   Platelets 191 150 - 400 K/uL   nRBC 0.0 0.0 - 0.2 %   Neutrophils Relative % 54 %   Neutro Abs 5.8 1.7 - 7.7 K/uL   Lymphocytes Relative 30 %   Lymphs Abs 3.2 0.7 - 4.0 K/uL   Monocytes Relative 11 %   Monocytes Absolute 1.1 (H) 0.1 - 1.0 K/uL   Eosinophils Relative 2 %   Eosinophils Absolute 0.3 0.0 - 0.5 K/uL   Basophils Relative 1 %   Basophils Absolute 0.1 0.0 - 0.1 K/uL   Immature Granulocytes 2 %   Abs Immature Granulocytes 0.20 (H) 0.00 - 0.07 K/uL  Basic metabolic panel   Collection Time: 10/12/23  4:17 AM  Result Value Ref Range   Sodium 135 135 - 145 mmol/L   Potassium 3.6 3.5 - 5.1 mmol/L   Chloride 98 98 - 111 mmol/L   CO2 21 (L) 22 - 32 mmol/L   Glucose, Bld 348 (H) 70 - 99 mg/dL   BUN 42 (H) 6 - 20 mg/dL   Creatinine, Ser 0.10 (H) 0.44 - 1.00 mg/dL   Calcium  7.9 (L) 8.9 - 10.3 mg/dL   GFR, Estimated 7 (L) >60 mL/min   Anion gap 16 (H) 5 - 15  hCG, serum, qualitative   Collection Time: 10/12/23  4:17 AM  Result Value Ref Range   Preg, Serum NEGATIVE NEGATIVE  Troponin I (High Sensitivity)   Collection Time: 10/12/23  4:17 AM  Result Value Ref Range   Troponin I (High Sensitivity) 19 (H) <18 ng/L  Brain natriuretic peptide   Collection Time: 10/12/23  5:00 AM  Result Value Ref Range   B Natriuretic Peptide >4,500.0 (H) 0.0 - 100.0 pg/mL  Troponin I (High Sensitivity)   Collection Time: 10/12/23  6:24 AM  Result Value Ref Range   Troponin I (High Sensitivity) 19 (H) <18 ng/L   DG Chest Portable 1 View Result Date: 10/12/2023 EXAM: 1 VIEW XRAY OF THE CHEST 10/12/2023 04:26:00 AM COMPARISON: 2-view chest x-ray 10/05/2023 CLINICAL HISTORY: Chest pain. Shortness of breath. Dialysis patient. FINDINGS: LUNGS AND PLEURA: No focal pulmonary opacity. No pulmonary edema. No pleural effusion. No pneumothorax. HEART AND  MEDIASTINUM: Cardiomegaly is stable. BONES AND SOFT TISSUES: No acute osseous abnormality. IMPRESSION: 1. No acute findings. 2. Stable cardiomegaly. Electronically signed by: Audree Leas MD 10/12/2023 04:31 AM EDT RP Workstation: [id number]   DG Chest 2 View Result Date: 10/05/2023 CLINICAL DATA:  Shortness of breath EXAM: CHEST - 2 VIEW COMPARISON:  09/09/2023 FINDINGS: Unchanged cardiomegaly.  Lungs are clear.  Normal pleural spaces. IMPRESSION: Unchanged cardiomegaly. Electronically Signed   By: Juanetta Nordmann M.D.   On: 10/05/2023 22:12   PERIPHERAL VASCULAR CATHETERIZATION Result Date: 09/25/2023 Images from the original result were not included. Patient name: KALLISTA PAE MRN: [id number] DOB: 1990/08/30 Sex: female 09/25/2023 Pre-operative Diagnosis: ESRD on HD, difficulty with dialysis sessions Post-operative diagnosis:  Same Surgeon:  Philipp Brawn, MD Procedure Performed: Ultrasound-guided access of right arm AVG Fistulogram and central venogram Balloon angioplasty of outflow vein stenosis, 10 x 40 Mustang Balloon angioplasty of in-stent restenosis, 8 x 80 Mustang Indications: 33 year old female with ESRD on HD who is having difficulty with dialysis sessions and necessitating multiple attempts at cannulation.  Recent benefits of fistulogram were reviewed, she expressed understanding and elected to proceed. Findings: Widely patent central venous system.  Approximate 50% stenosis of the outflow vein in the high brachial/axillary vein.  Approximate 50% in-stent restenosis at the proximal and distal stent edges within the previously placed stent in the graft.  Procedure:  The patient was identified in the holding area and taken to the cath lab  The patient was then placed supine on the table and prepped and draped in the usual sterile fashion.  A time out was called.  Ultrasound was used to evaluate the right arm AV access. This was accessed under u/s guidance. An 018 wire was advanced  without resistance, a micropuncture sheath was placed and fistulagram obtained which demonstrated the above findings.  This access was then upsized to a 6 F short sheath over a glidewire.  A Glidewire was then used to cross the lesions.  These were then treated with a 10 mm x 40 mm Mustang balloon at the outflow vein stenosis and a 8 mm x 80 Mustang balloon throughout the inguinal entirety of the previous placed stent.  Completion angiogram demonstrated adequate response. Contrast: 25 cc Impression: Adequate response to balloon angioplasty of the in-stent restenosis.  The outflow vein stenosis appears to be a sclerotic valve Philipp Brawn MD [facility] Office: [phone]    EKG: EKG Interpretation Date/Time:  Saturday October 12 2023 04:55:25 EDT Ventricular Rate:  103 PR Interval:  129 QRS Duration:  123 QT Interval:  360 QTC Calculation: 472 R Axis:   64  Text Interpretation: Sinus tachycardia Probable left atrial enlargement Nonspecific intraventricular conduction delay Borderline repolarization abnormality Confirmed by Maralee Senate, Lenvil Swaim ([id number]) on 10/12/2023 5:19:18 AM  Radiology: Lenell Query Chest Portable 1 View Result Date: 10/12/2023 EXAM: 1 VIEW XRAY OF THE CHEST 10/12/2023 04:26:00 AM COMPARISON: 2-view chest x-ray 10/05/2023 CLINICAL HISTORY: Chest pain. Shortness of breath. Dialysis patient. FINDINGS: LUNGS AND PLEURA: No  focal pulmonary opacity. No pulmonary edema. No pleural effusion. No pneumothorax. HEART AND MEDIASTINUM: Cardiomegaly is stable. BONES AND SOFT TISSUES: No acute osseous abnormality. IMPRESSION: 1. No acute findings. 2. Stable cardiomegaly. Electronically signed by: Audree Leas MD 10/12/2023 04:31 AM EDT RP Workstation: [id number]     Procedures   Medications Ordered in the ED  acetaminophen  (TYLENOL ) tablet 650 mg (has no administration in time range)    Or  acetaminophen  (TYLENOL ) suppository 650 mg (has no administration in  time range)  naloxone  (NARCAN ) injection 0.4 mg (has no administration in time range)  fentaNYL  (SUBLIMAZE ) injection 25 mcg (has no administration in time range)  Chlorhexidine  Gluconate Cloth 2 % PADS 6 each (has no administration in time range)  nitroGLYCERIN (NITROGLYN) 2 % ointment 0.5 inch (0.5 inches Topical Given 10/12/23 0544)  fentaNYL  (SUBLIMAZE ) injection 50 mcg (50 mcg Intravenous Given 10/12/23 0542)  acetaminophen  (TYLENOL ) tablet 1,000 mg (1,000 mg Oral Given 10/12/23 0541)  loratadine  (CLARITIN ) tablet 10 mg (10 mg Oral Given 10/12/23 0620)  ketorolac  (TORADOL ) 30 MG/ML injection 30 mg (30 mg Intravenous Given 10/12/23 0981)                                    Medical Decision Making Patient wit  Amount and/or Complexity of Data Reviewed Labs: ordered.    Details: White count slight up 10.6, hemoglobin slight low 10.8, normal platelets. Normal sodium 135, normal potassium 3.6, elevated creatinine 7.74.  > 4500 elevated troponin 19  Radiology: ordered and independent interpretation performed.    Details: Cardiomegaly  ECG/medicine tests: ordered and independent interpretation performed. Decision-making details documented in ED Course.  Risk OTC drugs. Prescription drug management. Decision regarding hospitalization.     Final diagnoses:  Anasarca   The patient appears reasonably stabilized for admission considering the current resources, flow, and capabilities available in the ED at this time, and I doubt any other [facility] requiring further screening and/or treatment in the ED prior to admission.   ED Discharge Orders     None          Lillyian Heidt, MD 10/12/23 [phone]

## 2023-10-12 NOTE — ED Notes (Signed)
Transport called for pt.

## 2023-10-13 ENCOUNTER — Observation Stay (HOSPITAL_COMMUNITY)

## 2023-10-13 DIAGNOSIS — E1065 Type 1 diabetes mellitus with hyperglycemia: Secondary | ICD-10-CM | POA: Diagnosis present

## 2023-10-13 DIAGNOSIS — E785 Hyperlipidemia, unspecified: Secondary | ICD-10-CM | POA: Diagnosis present

## 2023-10-13 DIAGNOSIS — E1042 Type 1 diabetes mellitus with diabetic polyneuropathy: Secondary | ICD-10-CM | POA: Diagnosis present

## 2023-10-13 DIAGNOSIS — E10649 Type 1 diabetes mellitus with hypoglycemia without coma: Secondary | ICD-10-CM | POA: Diagnosis not present

## 2023-10-13 DIAGNOSIS — Z833 Family history of diabetes mellitus: Secondary | ICD-10-CM | POA: Diagnosis not present

## 2023-10-13 DIAGNOSIS — J918 Pleural effusion in other conditions classified elsewhere: Secondary | ICD-10-CM | POA: Diagnosis present

## 2023-10-13 DIAGNOSIS — I5033 Acute on chronic diastolic (congestive) heart failure: Secondary | ICD-10-CM | POA: Diagnosis not present

## 2023-10-13 DIAGNOSIS — R188 Other ascites: Secondary | ICD-10-CM | POA: Diagnosis present

## 2023-10-13 DIAGNOSIS — I428 Other cardiomyopathies: Secondary | ICD-10-CM | POA: Diagnosis present

## 2023-10-13 DIAGNOSIS — Z885 Allergy status to narcotic agent status: Secondary | ICD-10-CM | POA: Diagnosis not present

## 2023-10-13 DIAGNOSIS — N186 End stage renal disease: Secondary | ICD-10-CM | POA: Diagnosis present

## 2023-10-13 DIAGNOSIS — I132 Hypertensive heart and chronic kidney disease with heart failure and with stage 5 chronic kidney disease, or end stage renal disease: Secondary | ICD-10-CM | POA: Diagnosis present

## 2023-10-13 DIAGNOSIS — Z888 Allergy status to other drugs, medicaments and biological substances status: Secondary | ICD-10-CM | POA: Diagnosis not present

## 2023-10-13 DIAGNOSIS — N2581 Secondary hyperparathyroidism of renal origin: Secondary | ICD-10-CM | POA: Diagnosis present

## 2023-10-13 DIAGNOSIS — F319 Bipolar disorder, unspecified: Secondary | ICD-10-CM | POA: Diagnosis present

## 2023-10-13 DIAGNOSIS — E1022 Type 1 diabetes mellitus with diabetic chronic kidney disease: Secondary | ICD-10-CM | POA: Diagnosis present

## 2023-10-13 DIAGNOSIS — I953 Hypotension of hemodialysis: Secondary | ICD-10-CM | POA: Diagnosis not present

## 2023-10-13 DIAGNOSIS — Z881 Allergy status to other antibiotic agents status: Secondary | ICD-10-CM | POA: Diagnosis not present

## 2023-10-13 DIAGNOSIS — Z883 Allergy status to other anti-infective agents status: Secondary | ICD-10-CM | POA: Diagnosis not present

## 2023-10-13 DIAGNOSIS — Z79899 Other long term (current) drug therapy: Secondary | ICD-10-CM | POA: Diagnosis not present

## 2023-10-13 DIAGNOSIS — Z880 Allergy status to penicillin: Secondary | ICD-10-CM | POA: Diagnosis not present

## 2023-10-13 DIAGNOSIS — Z7952 Long term (current) use of systemic steroids: Secondary | ICD-10-CM | POA: Diagnosis not present

## 2023-10-13 DIAGNOSIS — R0602 Shortness of breath: Secondary | ICD-10-CM | POA: Diagnosis present

## 2023-10-13 DIAGNOSIS — I5043 Acute on chronic combined systolic (congestive) and diastolic (congestive) heart failure: Secondary | ICD-10-CM | POA: Diagnosis present

## 2023-10-13 DIAGNOSIS — Z992 Dependence on renal dialysis: Secondary | ICD-10-CM | POA: Diagnosis not present

## 2023-10-13 DIAGNOSIS — D631 Anemia in chronic kidney disease: Secondary | ICD-10-CM | POA: Diagnosis present

## 2023-10-13 LAB — GLUCOSE, CAPILLARY
Glucose-Capillary: 141 mg/dL — ABNORMAL HIGH (ref 70–99)
Glucose-Capillary: 70 mg/dL (ref 70–99)
Glucose-Capillary: 63 mg/dL — ABNORMAL LOW (ref 70–99)
Glucose-Capillary: 112 mg/dL — ABNORMAL HIGH (ref 70–99)
Glucose-Capillary: 122 mg/dL — ABNORMAL HIGH (ref 70–99)
Glucose-Capillary: 150 mg/dL — ABNORMAL HIGH (ref 70–99)
Glucose-Capillary: 43 mg/dL — CL (ref 70–99)
Glucose-Capillary: 97 mg/dL (ref 70–99)
Glucose-Capillary: 76 mg/dL (ref 70–99)
Glucose-Capillary: 40 mg/dL — CL (ref 70–99)
Glucose-Capillary: 222 mg/dL — ABNORMAL HIGH (ref 70–99)

## 2023-10-13 LAB — BODY FLUID CELL COUNT WITH DIFFERENTIAL
Eos, Fluid: 0 %
Lymphs, Fluid: 18 %
Monocyte-Macrophage-Serous Fluid: 8 % — ABNORMAL LOW (ref 50–90)
Neutrophil Count, Fluid: 74 % — ABNORMAL HIGH (ref 0–25)
Total Nucleated Cell Count, Fluid: 846 uL (ref 0–1000)

## 2023-10-13 LAB — PROTEIN, PLEURAL OR PERITONEAL FLUID: Total protein, fluid: 3.2 g/dL

## 2023-10-13 LAB — HEPATITIS B SURFACE ANTIBODY, QUANTITATIVE: Hep B S AB Quant (Post): <3.5 m[IU]/mL — ABNORMAL LOW

## 2023-10-13 MED ORDER — DEXTROSE 50 % IV SOLN
INTRAVENOUS | Status: AC
  Filled 2023-10-13: qty 50

## 2023-10-13 MED ORDER — LOSARTAN POTASSIUM 50 MG PO TABS
50.0000 mg | ORAL_TABLET | Freq: Every day | ORAL | Status: DC
  Administered 2023-10-13 – 2023-10-14 (×2): 50 mg via ORAL
  Filled 2023-10-13: qty 1

## 2023-10-13 MED ORDER — DEXTROSE 50 % IV SOLN
INTRAVENOUS | Status: AC
  Administered 2023-10-13: 50 mL
  Filled 2023-10-13: qty 50

## 2023-10-13 MED ORDER — HYDROMORPHONE HCL 1 MG/ML IJ SOLN
0.5000 mg | Freq: Once | INTRAMUSCULAR | Status: AC
  Administered 2023-10-13: 0.5 mg via INTRAVENOUS
  Filled 2023-10-13: qty 1

## 2023-10-13 MED ORDER — DEXTROSE 50 % IV SOLN: 25.0000 g | INTRAVENOUS | Status: AC

## 2023-10-13 MED ORDER — LIDOCAINE HCL (PF) 1 % IJ SOLN
INTRAMUSCULAR | Status: AC
  Filled 2023-10-13: qty 30

## 2023-10-13 MED ORDER — DEXTROSE 50 % IV SOLN
25.0000 g | INTRAVENOUS | Status: AC
  Administered 2023-10-13: 25 g via INTRAVENOUS

## 2023-10-13 MED ORDER — ONDANSETRON HCL 4 MG/2ML IJ SOLN
INTRAMUSCULAR | Status: AC
  Administered 2023-10-13: 4 mg via INTRAVENOUS
  Filled 2023-10-13: qty 2

## 2023-10-13 MED ORDER — DEXTROSE 50 % IV SOLN
INTRAVENOUS | Status: AC
  Administered 2023-10-13: 25 g via INTRAVENOUS
  Filled 2023-10-13: qty 50

## 2023-10-13 MED ORDER — GLUCOSE 40 % PO GEL: 2.0000 | ORAL | Status: DC

## 2023-10-13 MED ORDER — DEXTROSE 10 % IV SOLN: INTRAVENOUS | Status: DC

## 2023-10-13 MED ORDER — DEXTROSE 50 % IV SOLN
25.0000 g | INTRAVENOUS | Status: AC
Start: 2023-10-13 — End: 2023-10-13

## 2023-10-13 MED ORDER — LIDOCAINE HCL (PF) 1 % IJ SOLN
10.0000 mL | Freq: Once | INTRAMUSCULAR | Status: AC
  Administered 2023-10-13: 10 mL via INTRADERMAL

## 2023-10-13 MED ORDER — ONDANSETRON HCL 4 MG/2ML IJ SOLN: 4.0000 mg | Freq: Four times a day (QID) | INTRAMUSCULAR | Status: DC | PRN

## 2023-10-13 MED ORDER — HYDROMORPHONE HCL 2 MG PO TABS
2.0000 mg | ORAL_TABLET | ORAL | Status: DC | PRN
  Administered 2023-10-13 – 2023-10-14 (×2): 2 mg via ORAL
  Filled 2023-10-13 (×3): qty 1

## 2023-10-13 NOTE — Hospital Course (Signed)
 33 y.o. female with medical history significant of ESRD HD TTS, HFrEF (EF 30-35% in 08/2023), DM1, prolonged QTc, and bipolar disorder p/w chest pain c/b SOB iso volume overload 2/2 ADHF and ESRD.

## 2023-10-13 NOTE — Progress Notes (Signed)
 [facility] Progress Note   Subjective:   tolerated UF 3.4L yesterday - had low BPs but had rec'd po antiHTN prior to HD.  This AM Ashley Freeman has significant abd pain and distention.  Has previously had LVP Ashley Freeman says.  Says passing stool and gas, no vomiting, no h/o gastroparesis.  Objective Vitals:   10/13/23 0028 10/13/23 0324 10/13/23 0622 10/13/23 0823  BP: 109/83 108/80  121/85  Pulse: 82 78  74  Resp: 15 16  18   Temp: 97.7 F (36.5 C) 97.8 F (36.6 C)  98.6 F (37 C)  TempSrc: Oral Oral  Oral  SpO2: 97% 96%  92%  Weight:   65.3 kg   Height:       Physical Exam General: sleeping but when awakened Ashley Freeman is uncomfortable in bed Heart:RRR Lungs: clear with dec BS bases but poor effort poss due to distended abd - 1005 on RA Abdomen: tense, distended, TTP no rebound, dec BS Extremities: trace edema Dialysis Access: RUE AVG +t/b  Additional Objective Labs: Basic Metabolic Panel: Recent Labs  Lab 10/12/23 0417  NA 135  K 3.6  CL 98  CO2 21*  GLUCOSE 348*  BUN 42*  CREATININE 7.74*  CALCIUM  7.9*   Liver Function Tests: No results for input(s): AST, ALT, ALKPHOS, BILITOT, PROT, ALBUMIN in the last 168 hours. No results for input(s): LIPASE, AMYLASE in the last 168 hours. CBC: Recent Labs  Lab 10/12/23 0417  WBC 10.6*  NEUTROABS 5.8  HGB 10.8*  HCT 34.2*  MCV 88.1  PLT 191   Blood Culture    Component Value Date/Time   SDES PERITONEAL 08/28/2023 1230   SPECREQUEST NONE 08/28/2023 1230   CULT  08/28/2023 1230    NO GROWTH 3 DAYS Performed at [facility], [address] [place], [place] [address]    REPTSTATUS 08/31/2023 FINAL 08/28/2023 1230    Cardiac Enzymes: No results for input(s): CKTOTAL, CKMB, CKMBINDEX, TROPONINI in the last 168 hours. CBG: Recent Labs  Lab 10/12/23 0857 10/12/23 1745 10/12/23 2103 10/13/23 0207 10/13/23 0805  GLUCAP 211* 186* 97 141* 70   Iron  Studies: No results for input(s):  IRON , TIBC, TRANSFERRIN, FERRITIN in the last 72 hours. @lablastinr3 @ Studies/Results: DG Chest Portable 1 View Result Date: 10/12/2023 EXAM: 1 VIEW XRAY OF THE CHEST 10/12/2023 04:26:00 AM COMPARISON: 2-view chest x-ray 10/05/2023 CLINICAL HISTORY: Chest pain. Shortness of breath. Dialysis patient. FINDINGS: LUNGS AND PLEURA: No focal pulmonary opacity. No pulmonary edema. No pleural effusion. No pneumothorax. HEART AND MEDIASTINUM: Cardiomegaly is stable. BONES AND SOFT TISSUES: No acute osseous abnormality. IMPRESSION: 1. No acute findings. 2. Stable cardiomegaly. Electronically signed by: Audree Leas MD 10/12/2023 04:31 AM EDT RP Workstation: [id number]   Medications:   bumetanide  2 mg Oral Daily   calcitRIOL   1.25 mcg Oral Q T,Th,Sat-1800   carvedilol   25 mg Oral BID WC   DULoxetine   20 mg Oral Daily   famotidine   10 mg Oral Daily   insulin  aspart  0-15 Units Subcutaneous TID WC   insulin  glargine-yfgn  10 Units Subcutaneous Daily   lamoTRIgine   200 mg Oral Daily   lipase/protease/amylase  12,000 Units Oral TID WC   losartan   100 mg Oral Daily   OLANZapine zydis  5 mg Oral QHS   rosuvastatin   40 mg Oral Daily   sevelamer  carbonate  2,400 mg Oral TID WC   sodium bicarbonate   1,300 mg Oral BID    Assessment/Plan Ashley Freeman is an  33 y.o. female with ESRD on HD, DM, HFrEF, anemia, depression, DM type 1, HL currently being admitted with CP and nephrology is consulted for comanagement of ESRD and assoc conditions.   **Abd pain:  distended.  Suspect ascites.  Will check US  and if significant ascites would benefit from LVP.   KUB also r/o obstruction or ileus.    **Acute on chronic HFrEF: in the setting of poor dialysis attendance.  Tolerated UF 3.4L yesterday limited by hypotension.  Serial HD to lower volume, ascites mgmt per above.    **ESRD on HD: TTS [facility].  Will need orders Mon.  HD here Sat limited UF due to low BP - Ashley Freeman is agreeable to  another HD tomorrow for fluid.  Issues with childcare have led to poor attendance.  Involve SW as able but has SW support at HD unit as well.    **Anemia of CKD: Hb 10.8   **Secondary hyperparathyroidism:  cont outpt binders, calcitriol . Renal diet.    **HTN/volume: cont home meds, UF with HD. BP low - decrease losartan .  Hold meds pre HD.    **DM type 1: meds per primary   Will follow, reach out with concerns.    Adrian Alba MD 10/13/2023, 9:02 AM  [facility] Pager: [phone]

## 2023-10-13 NOTE — Plan of Care (Signed)
 Due to persistent hypoglycemia started patient on D10.  Will closely monitor.  Patient's insulin  is already on hold.  Ashley Freeman.

## 2023-10-13 NOTE — Progress Notes (Signed)
  Progress Note   Patient: Ashley Freeman WUJ:[id number] DOB: 07-14-1990 DOA: 10/12/2023     0 DOS: the patient was seen and examined on 10/13/2023 at 8:58AM      Brief hospital course: 33 y.o. female with medical history significant of ESRD HD TTS, HFrEF (EF 30-35% in 08/2023), DM1, prolonged QTc, and bipolar disorder p/w chest pain c/b SOB iso volume overload 2/2 ADHF and ESRD.      Assessment and Plan: Fluid overload from inadequate dialysis ESRD - HD per nephrology - Continue home Bumex  Abdominal pain This is new, nonfocal.  KUB shows no obstruction.  She is not vomiting, she is passing gas.  Ultrasound showed ascites, and so she underwent paracentesis of 3 L, after which her pain improved  Of note, on Creon, can't find indication - Check lipase  Acute on chronic systolic congestive heart failure Hypertension  Blood pressure soft - HD per nephrology - Continue home Bumex, carvedilol , Crestor  - Reduce losartan   Diabetes Hypoglycemic due to not eating from abdominal pain - Continue Cymbalta  for peripheral neuropathy - Hold home glargine - Continue sliding scale corrections  Bipolar disorder -Continue duloxetine , Lamictal , olanzapine  Anemia of chronic kidney disease       Subjective: Adult female, lying in bed, has abdominal pain, no vomiting, no bowel movement but is passing gas.  No fever, no respiratory symptoms.     Physical Exam: BP 122/83 (BP Location: Left Arm)   Pulse 76   Temp 98 F (36.7 C) (Axillary)   Resp 18   Ht 5' 3 (1.6 m)   Wt 65.3 kg   LMP  (LMP Unknown)   SpO2 92%   BMI 25.49 kg/m   adult female, lying in bed, no acute distress, but uncomfortable RRR, no murmurs, no peripheral edema Respiratory rate normal, lungs clear without rales or wheezes Abdomen distended, firm with ascites, diffuse tenderness, hyperactive bowel sounds Attention normal, affect blunted by pain, judgment and insight appear normal, face  symmetric, speech fluent    Data Reviewed: Basic metabolic panel consistent with end-stage renal disease CBC with mild anemia Discussed with nephrology  Family Communication:     Disposition: Status is: Inpatient         Author: Ephriam Hashimoto, MD 10/13/2023 5:31 PM  For on call review [email or web address].

## 2023-10-13 NOTE — Progress Notes (Signed)
 Hypoglycemic Event  CBG: 40  Treatment: {Hypoglycemic Treatment (must also place order and document administration):3049002}  Symptoms: {Hypoglycemic Symptoms:3049003}  Follow-up CBG: Time: CBG Result:  Possible Reasons for Event: Inadequate meal intake  Comments/MD notified:Kakrakandy    Lynard Postlewait W

## 2023-10-13 NOTE — Progress Notes (Signed)
 At approximately 15:35, received call from ultrasound regarding patient while off the floor for paracentesis.  Patient's glucose during procedure had apparently dropped precipitously to 40 mg/dL and patient was feeling diaphoretic, uneasy and close to panic.  She was given orange juice x 2 with little effect.  Reported to ultrasound unit and administered 25 gm dextrose  IV per hypoglycemia protocol orders.  Following intervention, returned patient to nursing unit personally and immediately rechecked CBG; 122 mg/dL and patient reporting feeling better, but very tired/sleepy.  Likely reason(s) for hypoglycemic event include recent poor PO intake related to ABD discomfort earlier today and overall disease process (stage IV CKD on hemodialysis, type 1 diabetes mellitus, gross ascites).  Novolog  insulin  has not been given this shift; patient received insulin  glargine (Semglee ) 10 units SQ for basal coverage only.  Notified Dr. Darlyn Eke via secure message and face to face discussion on unit.  Will monitor CBG closely, continue to support with approved fluids/snacks as tolerated and update MD as needed.

## 2023-10-13 NOTE — Progress Notes (Signed)
 Patient c/o 9/10 left upper quadrant ABD pain at time of assessment this AM at 07:55.  No effect with oxycodone  5mg  PO as ordered PRN for pain.  Notified Dr. Darlyn Eke.

## 2023-10-14 DIAGNOSIS — I5033 Acute on chronic diastolic (congestive) heart failure: Secondary | ICD-10-CM | POA: Diagnosis not present

## 2023-10-14 LAB — RENAL FUNCTION PANEL
Albumin: 2.6 g/dL — ABNORMAL LOW (ref 3.5–5.0)
Anion gap: 14 (ref 5–15)
BUN: 39 mg/dL — ABNORMAL HIGH (ref 6–20)
CO2: 25 mmol/L (ref 22–32)
Calcium: 8.1 mg/dL — ABNORMAL LOW (ref 8.9–10.3)
Chloride: 97 mmol/L — ABNORMAL LOW (ref 98–111)
Creatinine, Ser: 6.52 mg/dL — ABNORMAL HIGH (ref 0.44–1.00)
GFR, Estimated: 8 mL/min — ABNORMAL LOW (ref >60)
Glucose, Bld: 94 mg/dL (ref 70–99)
Phosphorus: 5.2 mg/dL — ABNORMAL HIGH (ref 2.5–4.6)
Potassium: 4.6 mmol/L (ref 3.5–5.1)
Sodium: 136 mmol/L (ref 135–145)

## 2023-10-14 LAB — CBC
HCT: 37.7 % (ref 36.0–46.0)
Hemoglobin: 11.7 g/dL — ABNORMAL LOW (ref 12.0–15.0)
MCH: 27.2 pg (ref 26.0–34.0)
MCHC: 31 g/dL (ref 30.0–36.0)
MCV: 87.7 fL (ref 80.0–100.0)
Platelets: 191 10*3/uL (ref 150–400)
RBC: 4.3 MIL/uL (ref 3.87–5.11)
RDW: 15.3 % (ref 11.5–15.5)
WBC: 9.9 10*3/uL (ref 4.0–10.5)
nRBC: 0 % (ref 0.0–0.2)

## 2023-10-14 LAB — GLUCOSE, CAPILLARY
Glucose-Capillary: 148 mg/dL — ABNORMAL HIGH (ref 70–99)
Glucose-Capillary: 228 mg/dL — ABNORMAL HIGH (ref 70–99)
Glucose-Capillary: 257 mg/dL — ABNORMAL HIGH (ref 70–99)
Glucose-Capillary: 288 mg/dL — ABNORMAL HIGH (ref 70–99)
Glucose-Capillary: 319 mg/dL — ABNORMAL HIGH (ref 70–99)
Glucose-Capillary: 51 mg/dL — ABNORMAL LOW (ref 70–99)
Glucose-Capillary: 52 mg/dL — ABNORMAL LOW (ref 70–99)
Glucose-Capillary: 78 mg/dL (ref 70–99)
Glucose-Capillary: 81 mg/dL (ref 70–99)
Glucose-Capillary: 86 mg/dL (ref 70–99)

## 2023-10-14 LAB — LIPASE, BLOOD: Lipase: 21 U/L (ref 11–51)

## 2023-10-14 LAB — PATHOLOGIST SMEAR REVIEW

## 2023-10-14 MED ORDER — ATORVASTATIN CALCIUM 80 MG PO TABS
80.0000 mg | ORAL_TABLET | Freq: Every day | ORAL | Status: DC
  Administered 2023-10-14: 80 mg via ORAL
  Filled 2023-10-14: qty 1

## 2023-10-14 MED ORDER — NITROGLYCERIN 0.4 MG SL SUBL
SUBLINGUAL_TABLET | SUBLINGUAL | Status: AC
  Filled 2023-10-14: qty 1

## 2023-10-14 MED ORDER — HYDROMORPHONE HCL 1 MG/ML IJ SOLN
0.2000 mg | Freq: Once | INTRAMUSCULAR | Status: AC
  Administered 2023-10-14: 0.2 mg via INTRAVENOUS
  Filled 2023-10-14: qty 1

## 2023-10-14 MED ORDER — METOCLOPRAMIDE HCL 10 MG PO TABS: 10.0000 mg | ORAL_TABLET | Freq: Three times a day (TID) | ORAL | 0 refills | Status: DC | PRN

## 2023-10-14 MED ORDER — MIDODRINE HCL 5 MG PO TABS: 10.0000 mg | ORAL_TABLET | ORAL | Status: DC

## 2023-10-14 MED ORDER — DEXTROSE 50 % IV SOLN
INTRAVENOUS | Status: AC
  Filled 2023-10-14: qty 50

## 2023-10-14 MED ORDER — ATORVASTATIN CALCIUM 80 MG PO TABS: 80.0000 mg | ORAL_TABLET | Freq: Every day | ORAL | 3 refills | Status: DC

## 2023-10-14 MED ORDER — DEXTROSE 50 % IV SOLN: 1.0000 | Freq: Once | INTRAVENOUS | Status: AC

## 2023-10-14 MED ORDER — KETOROLAC TROMETHAMINE 30 MG/ML IJ SOLN
30.0000 mg | Freq: Once | INTRAMUSCULAR | Status: AC
  Administered 2023-10-14: 30 mg via INTRAVENOUS
  Filled 2023-10-14: qty 1

## 2023-10-14 MED ORDER — NITROGLYCERIN 0.4 MG SL SUBL
0.4000 mg | SUBLINGUAL_TABLET | SUBLINGUAL | Status: DC | PRN
  Administered 2023-10-14: 0.4 mg via SUBLINGUAL

## 2023-10-14 NOTE — Progress Notes (Signed)
 Cbg of 51 this am. RN rechecked and cbg was 52. Pt had just finished eating prior to recheck. RN administered d50 and Dr. Darlyn Eke was alerted. 15 min check cbg was 288. Pt states that she feels much better.

## 2023-10-14 NOTE — Progress Notes (Signed)
 Patient has refused dialysis today. Patient has been provided education regarding the importance of fluid removal and clearance.

## 2023-10-14 NOTE — Progress Notes (Signed)
 Patient has had 3 episodes of hypoglycemia since 6/15 paracentesis procedure related to inadequate PO intake.  Last CBG 40 and treated with D50, patient then requested juice and ate her dinner tray that was saved.  Recheck 222 and 148 most recently.  Orders for D10 received from on call MD, patient declined IVF but agreeable to rechecking CBG in 2 hours.  Dr. [facility] updated.

## 2023-10-14 NOTE — Discharge Summary (Signed)
 Physician Discharge Summary   Patient: Ashley Freeman MRN: [id number] DOB: 10/16/1990  Admit date:     10/12/2023  Discharge date: 10/14/23  Discharge Physician: Ephriam Hashimoto   PCP: Skip Dull Pap, MD     Recommendations at discharge:  Follow up with PCP Benison [name] in 1 week for recurrent abdominal pain Dr. Danetta Dunnings: Please refer for outpatient gastric emptying study if able      Discharge Diagnoses: Principal Problem:   Acute on chronic systolic and diastolic heart failure (HCC) Other hospital problems:   Abdominal pain and nausea   Costochondritis   ESRD   Fluid overload from missed dialysis   Ascites   Diabetes   Hypoglycemia   Hyperglycemia   Bipolar disorder   Anemia of CKD    Hospital Course: 33 y.o. female with medical history significant of ESRD HD TTS, HFrEF (EF 30-35% in 08/2023), DM1, prolonged QTc, and bipolar disorder p/w chest pain c/b SOB iso volume overload 2/2 ADHF and ESRD.     Fluid overload from inadequate dialysis Acute on chronic systolic and diastolic CHF ESRD Ascites Admitted and underwent HD from Nephrology and CP and dyspnea resolved.  Bumex resumed.  She had persistent abdominal distension and so paracentesis was obtained and an additional 2.7L were removed and her abdominal distension improved.    Abdominal pain and nausea Looking back over the last several months, the patient has had numerous admissions for abdominal pain and nausea.  She did have a cholecystectomy during 1 of those admissions, and during another admission she was noted to have a expected postop fluid collection, but no other findings.  Given her diabetes, and symptoms of nausea and abdominal discomfort, wonder if this is gastroparesis.  There is no findings of acute or chronic pancreatitis.  She underwent ultrasound here that was unremarkable.  Eventually her pain resolved spontaneously, she tolerated full oral diet, and was discharged  home. - Recommend outpatient gastric emptying study given recurrent episodes of abdominal pain and nausea with no other findings  -As needed Reglan  at discharge, QTc less than 500.   Hyperglycemia Hypoglycemia Earlier in her hospital stay, patient was nauseated and unable to eat, and had recurrent hypoglycemia requiring dextrose .  Subsequently she was able to tolerate oral diet, and had hyperglycemia.  She was discharged on her home antiglycemic regimen.   Costochondritis Patient reported chest pain during her last night in the hospital, EKG showed no ST changes.  This pain was easily reproducible with palpation of the precordium, and resolved with ketorolac .         The [place]  Controlled Substances Registry was reviewed for this patient prior to discharge.  Consultants: Nephrology Procedures performed: Paracentesis   Disposition: Home Diet recommendation:  Renal, diabetic  DISCHARGE MEDICATION: Allergies as of 10/14/2023       Reactions   Cephalexin  Anaphylaxis   Has gotten ceftriaxone  in the past    Morphine  Itching   Penicillins Hives, Rash   Fish Allergy    Benadryl  [diphenhydramine ] Itching   Dilaudid  [hydromorphone ] Itching   Doxycycline  Itching   Methotrexate Derivatives Rash   Oxycodone  Itching        Medication List     STOP taking these medications    dicyclomine  20 MG tablet Commonly known as: BENTYL    rosuvastatin  40 MG tablet Commonly known as: CRESTOR        TAKE these medications    acetaminophen  500 MG tablet Commonly known as: TYLENOL  Take 1,000 mg by mouth as  needed for mild pain (pain score 1-3) or moderate pain (pain score 4-6).   albuterol  108 (90 Base) MCG/ACT inhaler Commonly known as: VENTOLIN  HFA Inhale 2 puffs into the lungs every 4 (four) hours as needed for wheezing or shortness of breath.   atorvastatin  80 MG tablet Commonly known as: LIPITOR Take 1 tablet (80 mg total) by mouth daily. Start taking on: October 15, 2023   bumetanide 2 MG tablet Commonly known as: BUMEX Take 2 mg by mouth daily.   calcitRIOL  0.25 MCG capsule Commonly known as: ROCALTROL  Take 5 capsules (1.25 mcg total) by mouth every Tuesday, Thursday, and Saturday at 6 PM.   carvedilol  25 MG tablet Commonly known as: COREG  Take 25 mg by mouth 2 (two) times daily with a meal.   cyclobenzaprine  5 MG tablet Commonly known as: FLEXERIL  Take 1 tablet (5 mg total) by mouth 3 (three) times daily as needed for muscle spasms.   Dexcom G7 Sensor Misc Change sensors every 10 days   DULoxetine  20 MG capsule Commonly known as: CYMBALTA  Take 20 mg by mouth daily.   famotidine  20 MG tablet Commonly known as: PEPCID  Take 20 mg by mouth 2 (two) times daily.   hydrOXYzine  25 MG tablet Commonly known as: ATARAX  Take 25 mg by mouth every 6 (six) hours as needed for anxiety or itching.   insulin  aspart 100 UNIT/ML injection Commonly known as: novoLOG  Inject 5 Units into the skin 3 (three) times daily as needed for high blood sugar.   lamoTRIgine  200 MG tablet Commonly known as: LAMICTAL  Take 200 mg by mouth daily.   Lantus  SoloStar 100 UNIT/ML Solostar Pen Generic drug: insulin  glargine Inject 10 Units into the skin at bedtime.   LORazepam  1 MG tablet Commonly known as: ATIVAN  Take 1 mg by mouth daily as needed.   losartan  100 MG tablet Commonly known as: COZAAR  Take 150 mg by mouth daily.   methylphenidate 18 MG CR tablet Commonly known as: CONCERTA Take 18 mg by mouth every morning.   metoCLOPramide  10 MG tablet Commonly known as: REGLAN  Take 1 tablet (10 mg total) by mouth every 8 (eight) hours as needed for nausea.   OLANZapine zydis 5 MG disintegrating tablet Commonly known as: ZYPREXA Take 5 mg by mouth at bedtime.   ondansetron  4 MG disintegrating tablet Commonly known as: ZOFRAN -ODT Take 1 tablet (4 mg total) by mouth every 8 (eight) hours as needed for nausea or vomiting.   Pancrelipase  (Lip-Prot-Amyl) 3000-9500 units Cpep Take 3,000 units of lipase by mouth 3 (three) times daily.   prochlorperazine  5 MG tablet Commonly known as: COMPAZINE  Take 5 mg by mouth every 6 (six) hours as needed for nausea.   sevelamer  carbonate 800 MG tablet Commonly known as: RENVELA  Take 2,400 mg by mouth 3 (three) times daily with meals.   sodium bicarbonate  650 MG tablet Take 1,300 mg by mouth 2 (two) times daily.   SUMAtriptan  50 MG tablet Commonly known as: IMITREX  Take 50 mg by mouth every 2 (two) hours as needed for migraine or headache.        Follow-up Information     [name], [name], MD Follow up.   Specialty: Family Medicine Contact information: [address] [place] [address] [phone]                 Discharge Instructions     Discharge instructions   Complete by: As directed    **IMPORTANT DISCHARGE INSTRUCTIONS**  You were admitted  for fluid overload from missed dialysis  Here, you underwent dialysis and we took extra fluid off with the paracentesis  You had imaging of the abdomen with an ultrasound that was reassuring.   Your abdominal pain and nausea symptoms sound like they are due to gastroparesis, which is a common complication of diabetes Gastroparesis is made worse by opiates like oxycodone  and Dilaudid  and fentanyl , so avoid these when you are nauseated.  The chest pain you had was from costochondritis For this, you were treated here with ketorolac /Toradol   Resume your normal diet Go see your primary doctor in 1 week  Our pharmacist noticed you were on the cholesterol medicine rosuvastatin /Crestor , which is not effective for people with dialysis  STOP Crestor  and START atorvastatin /Lipitor 80 mg nightly for cholesterol  Go see your primary docotr in 1 week Ask them about gastroparesis  If you have nausea in the meantime, take Reglan /metoclopramide  up to 2-3 times daily   Increase activity slowly   Complete by: As directed         Discharge Exam: Filed Weights   10/12/23 1722 10/13/23 0622 10/14/23 0417  Weight: 65.8 kg 65.3 kg 63.5 kg    General: Pt is alert, awake, not in acute distress Cardiovascular: RRR, nl S1-S2, no murmurs appreciated.   No LE edema.   Respiratory: Normal respiratory rate and rhythm.  CTAB without rales or wheezes. Abdominal: Abdomen soft and non-tender.  No distension or HSM.   Neuro/Psych: Strength symmetric in upper and lower extremities.  Judgment and insight appear normal.   Condition at discharge: good  The results of significant diagnostics from this hospitalization (including imaging, microbiology, ancillary and laboratory) are listed below for reference.   Imaging Studies: US  Paracentesis Result Date: 10/14/2023 INDICATION: Ascites EXAM: ULTRASOUND GUIDED  PARACENTESIS MEDICATIONS: None. COMPLICATIONS: None immediate. PROCEDURE: Informed written consent was obtained from the patient after a discussion of the risks, benefits and alternatives to treatment. A timeout was performed prior to the initiation of the procedure. Initial ultrasound scanning demonstrates a large amount of ascites within the right lower abdominal quadrant. The right lower abdomen was prepped and draped in the usual sterile fashion. 1% lidocaine  was used for local anesthesia. Following this, a 6 Fr Safe-T-Centesis catheter was introduced. An ultrasound image was saved for documentation purposes. The paracentesis was performed. The catheter was removed and a dressing was applied. The patient tolerated the procedure well without immediate post procedural complication. Patient received post-procedure intravenous albumin; see nursing notes for details. FINDINGS: A total of approximately 2700 ML of YELLOW fluid was removed. Samples were sent to the laboratory IF requested by the clinical team. IMPRESSION: Successful ultrasound-guided paracentesis yielding 2.7 liters of peritoneal fluid. PLAN: If the patient  eventually requires >/=2 paracenteses in a 30 day period, candidacy for formal evaluation by the [facility] will be assessed. Electronically Signed   By: Fernando Hoyer M.D.   On: 10/14/2023 06:45   US  ASCITES (ABDOMEN LIMITED) Result Date: 10/13/2023 CLINICAL DATA:  Abdominal distension.  Assess for ascites. EXAM: LIMITED ABDOMEN ULTRASOUND FOR ASCITES TECHNIQUE: Limited ultrasound survey for ascites was performed in all four abdominal quadrants. COMPARISON:  None Available. FINDINGS: Evaluation of the 4 quadrants of the abdomen demonstrates moderate volume ascites. IMPRESSION: Positive for moderate ascites. Electronically Signed   By: Fernando Hoyer M.D.   On: 10/13/2023 11:42   DG Abd Portable 1V Result Date: 10/13/2023 CLINICAL DATA:  Abdominal distension. EXAM: PORTABLE ABDOMEN - 1 VIEW COMPARISON:  1225. FINDINGS: The bowel gas pattern is normal. No radio-opaque calculi or other significant radiographic abnormality are seen. IMPRESSION: Negative. Electronically Signed   By: Sydell Eva M.D.   On: 10/13/2023 11:02   DG Chest Portable 1 View Result Date: 10/12/2023 EXAM: 1 VIEW XRAY OF THE CHEST 10/12/2023 04:26:00 AM COMPARISON: 2-view chest x-ray 10/05/2023 CLINICAL HISTORY: Chest pain. Shortness of breath. Dialysis patient. FINDINGS: LUNGS AND PLEURA: No focal pulmonary opacity. No pulmonary edema. No pleural effusion. No pneumothorax. HEART AND MEDIASTINUM: Cardiomegaly is stable. BONES AND SOFT TISSUES: No acute osseous abnormality. IMPRESSION: 1. No acute findings. 2. Stable cardiomegaly. Electronically signed by: Audree Leas MD 10/12/2023 04:31 AM EDT RP Workstation: [id number]   DG Chest 2 View Result Date: 10/05/2023 CLINICAL DATA:  Shortness of breath EXAM: CHEST - 2 VIEW COMPARISON:  09/09/2023 FINDINGS: Unchanged cardiomegaly.  Lungs are clear.  Normal pleural spaces. IMPRESSION: Unchanged cardiomegaly. Electronically  Signed   By: Juanetta Nordmann M.D.   On: 10/05/2023 22:12   PERIPHERAL VASCULAR CATHETERIZATION Result Date: 09/25/2023 Images from the original result were not included. Patient name: KATERI BALCH MRN: [id number] DOB: [birth year] Sex: female 09/25/2023 Pre-operative Diagnosis: ESRD on HD, difficulty with dialysis sessions Post-operative diagnosis:  Same Surgeon:  Philipp Brawn, MD Procedure Performed: Ultrasound-guided access of right arm AVG Fistulogram and central venogram Balloon angioplasty of outflow vein stenosis, 10 x 40 Mustang Balloon angioplasty of in-stent restenosis, 8 x 80 Mustang Indications: 33 year old female with ESRD on HD who is having difficulty with dialysis sessions and necessitating multiple attempts at cannulation.  Recent benefits of fistulogram were reviewed, she expressed understanding and elected to proceed. Findings: Widely patent central venous system.  Approximate 50% stenosis of the outflow vein in the high brachial/axillary vein.  Approximate 50% in-stent restenosis at the proximal and distal stent edges within the previously placed stent in the graft.  Procedure:  The patient was identified in the holding area and taken to the cath lab  The patient was then placed supine on the table and prepped and draped in the usual sterile fashion.  A time out was called.  Ultrasound was used to evaluate the right arm AV access. This was accessed under u/s guidance. An 018 wire was advanced without resistance, a micropuncture sheath was placed and fistulagram obtained which demonstrated the above findings.  This access was then upsized to a 6 F short sheath over a glidewire.  A Glidewire was then used to cross the lesions.  These were then treated with a 10 mm x 40 mm Mustang balloon at the outflow vein stenosis and a 8 mm x 80 Mustang balloon throughout the inguinal entirety of the previous placed stent.  Completion angiogram demonstrated adequate response. Contrast: 25 cc  Impression: Adequate response to balloon angioplasty of the in-stent restenosis.  The outflow vein stenosis appears to be a sclerotic valve Philipp Brawn MD [facility] Office: [phone]   Microbiology: Results for orders placed or performed during the hospital encounter of 10/12/23  Body fluid culture w Gram Stain     Status: None (Preliminary result)   Collection Time: 10/13/23  3:38 PM   Specimen: Abdomen; Peritoneal Fluid  Result Value Ref Range Status   Specimen Description PERITONEAL  Final   Special Requests NONE  Final   Gram Stain   Final    RARE WBC PRESENT, PREDOMINANTLY MONONUCLEAR NO ORGANISMS SEEN    Culture   Final    NO GROWTH <  24 HOURS Performed at [facility], [address] [place], [place] [address]    Report Status PENDING  Incomplete    Labs: CBC: Recent Labs  Lab 10/12/23 0417 10/14/23 0413  WBC 10.6* 9.9  NEUTROABS 5.8  --   HGB 10.8* 11.7*  HCT 34.2* 37.7  MCV 88.1 87.7  PLT 191 191   Basic Metabolic Panel: Recent Labs  Lab 10/12/23 0417 10/14/23 0413  NA 135 136  K 3.6 4.6  CL 98 97*  CO2 21* 25  GLUCOSE 348* 94  BUN 42* 39*  CREATININE 7.74* 6.52*  CALCIUM  7.9* 8.1*  PHOS  --  5.2*   Liver Function Tests: Recent Labs  Lab 10/14/23 0413  ALBUMIN 2.6*   CBG: Recent Labs  Lab 10/14/23 0742 10/14/23 0805 10/14/23 1006 10/14/23 1220 10/14/23 1519  GLUCAP 52* 288* 228* 257* 319*    Discharge time spent: approximately 45 minutes spent on discharge counseling, evaluation of patient on day of discharge, and coordination of discharge planning with nursing, social work, pharmacy and case management  Signed: Ephriam Hashimoto, MD [facility] 10/14/2023

## 2023-10-14 NOTE — Progress Notes (Signed)
 Patient's CBG 86, patient agreeable to starting ordered IVF with dextrose .  D10 started, will recheck CBG in 2 hours as ordered.

## 2023-10-14 NOTE — Progress Notes (Signed)
 Pt receives out-pt HD at [facility] on TTS 2nd shift. Clinic aware pt is hospitalized. Will assist as needed.   Lauraine Polite Renal Navigator [phone]

## 2023-10-14 NOTE — Progress Notes (Signed)
 Patient reported midsternal chest pressure and burning 8/10 after morning labs collected.  No relief with 1 SL NTG.  Offered PRN PO dilaudid , patient requesting IV dilaudid  for pain.  EKG done QTC 509.  Dr. [facility] notified.

## 2023-10-14 NOTE — Progress Notes (Signed)
 Patient's chest pain moved to right side of chest.  Patient states she has had this pain off and on since gall bladder removal surgery.  Pain was relieved with IV 0.2 mg dilaudid . Patient also reported brief episode of right 4 fingers numb in the middle region of fingers.  She stated she has not had this happen since graft initially placed.  MD was notified of brief numbness as well.

## 2023-10-14 NOTE — Progress Notes (Addendum)
 [facility] Progress Note   Subjective: Seen in room with primary. She is complaining of chest pain that can be reproduced by exam. Primary considering toradol . Paracentesis 06/15 yield 2.7 liters. Extra HD ordered for today. Yesterday she agreed, today she is saying she wishes to go tomorrow. Encouraged to come to extra tx today as she is known to be noncompliant with OP HD.     Scratching, dry excoriated skin. Says benadryl  makes itching worse...  Objective Vitals:   10/13/23 1921 10/14/23 0012 10/14/23 0417 10/14/23 0718  BP: (!) 142/89 125/84 117/80 137/89  Pulse: 73 77 76 79  Resp: 19 18 19 16   Temp: 97.6 F (36.4 C) (!) 97.5 F (36.4 C) 98.9 F (37.2 C) 98.7 F (37.1 C)  TempSrc: Oral Oral Oral Oral  SpO2: 93%  99%   Weight:   63.5 kg   Height:       Physical Exam General: Chronically ill appearing young female in NAD Heart: Tenderness upper chest. S1,S2 RRR No rub. No M/G. SR on monitor Lungs: CTAB Abdomen: still distended, NABS Extremities: trace edema hips, no LE edema Dialysis Access: R AVG + T/B   Additional Objective Labs: Basic Metabolic Panel: Recent Labs  Lab 10/12/23 0417 10/14/23 0413  NA 135 136  K 3.6 4.6  CL 98 97*  CO2 21* 25  GLUCOSE 348* 94  BUN 42* 39*  CREATININE 7.74* 6.52*  CALCIUM  7.9* 8.1*  PHOS  --  5.2*   Liver Function Tests: Recent Labs  Lab 10/14/23 0413  ALBUMIN 2.6*   Recent Labs  Lab 10/14/23 0413  LIPASE 21   CBC: Recent Labs  Lab 10/12/23 0417 10/14/23 0413  WBC 10.6* 9.9  NEUTROABS 5.8  --   HGB 10.8* 11.7*  HCT 34.2* 37.7  MCV 88.1 87.7  PLT 191 191   Blood Culture    Component Value Date/Time   SDES PERITONEAL 10/13/2023 1538   SPECREQUEST NONE 10/13/2023 1538   CULT PENDING 10/13/2023 1538   REPTSTATUS PENDING 10/13/2023 1538    Cardiac Enzymes: No results for input(s): CKTOTAL, CKMB, CKMBINDEX, TROPONINI in the last 168 hours. CBG: Recent Labs  Lab 10/14/23 0605  10/14/23 0715 10/14/23 0742 10/14/23 0805 10/14/23 1006  GLUCAP 81 51* 52* 288* 228*   Iron  Studies: No results for input(s): IRON , TIBC, TRANSFERRIN, FERRITIN in the last 72 hours. @lablastinr3 @ Studies/Results: US  Paracentesis Result Date: 10/14/2023 INDICATION: Ascites EXAM: ULTRASOUND GUIDED  PARACENTESIS MEDICATIONS: None. COMPLICATIONS: None immediate. PROCEDURE: Informed written consent was obtained from the patient after a discussion of the risks, benefits and alternatives to treatment. A timeout was performed prior to the initiation of the procedure. Initial ultrasound scanning demonstrates a large amount of ascites within the right lower abdominal quadrant. The right lower abdomen was prepped and draped in the usual sterile fashion. 1% lidocaine  was used for local anesthesia. Following this, a 6 Fr Safe-T-Centesis catheter was introduced. An ultrasound image was saved for documentation purposes. The paracentesis was performed. The catheter was removed and a dressing was applied. The patient tolerated the procedure well without immediate post procedural complication. Patient received post-procedure intravenous albumin; see nursing notes for details. FINDINGS: A total of approximately 2700 ML of YELLOW fluid was removed. Samples were sent to the laboratory IF requested by the clinical team. IMPRESSION: Successful ultrasound-guided paracentesis yielding 2.7 liters of peritoneal fluid. PLAN: If the patient eventually requires >/=2 paracenteses in a 30 day period, candidacy for formal evaluation by the [facility] Interventional Radiology  Portal Hypertension Clinic will be assessed. Electronically Signed   By: Fernando Hoyer M.D.   On: 10/14/2023 06:45   US  ASCITES (ABDOMEN LIMITED) Result Date: 10/13/2023 CLINICAL DATA:  Abdominal distension.  Assess for ascites. EXAM: LIMITED ABDOMEN ULTRASOUND FOR ASCITES TECHNIQUE: Limited ultrasound survey for ascites was performed in all four  abdominal quadrants. COMPARISON:  None Available. FINDINGS: Evaluation of the 4 quadrants of the abdomen demonstrates moderate volume ascites. IMPRESSION: Positive for moderate ascites. Electronically Signed   By: Fernando Hoyer M.D.   On: 10/13/2023 11:42   DG Abd Portable 1V Result Date: 10/13/2023 CLINICAL DATA:  Abdominal distension. EXAM: PORTABLE ABDOMEN - 1 VIEW COMPARISON:  1225. FINDINGS: The bowel gas pattern is normal. No radio-opaque calculi or other significant radiographic abnormality are seen. IMPRESSION: Negative. Electronically Signed   By: Sydell Eva M.D.   On: 10/13/2023 11:02   Medications:  dextrose  50 mL/hr at 10/14/23 0239    bumetanide  2 mg Oral Daily   calcitRIOL   1.25 mcg Oral Q T,Th,Sat-1800   carvedilol   25 mg Oral BID WC   DULoxetine   20 mg Oral Daily   famotidine   10 mg Oral Daily   lamoTRIgine   200 mg Oral Daily   lipase/protease/amylase  12,000 Units Oral TID WC   losartan   50 mg Oral Daily   OLANZapine zydis  5 mg Oral QHS   rosuvastatin   40 mg Oral Daily   sevelamer  carbonate  2,400 mg Oral TID WC   sodium bicarbonate   1,300 mg Oral BID   HD [facility] [address] T,Th,S  Followed by [facility] 3 hr 45 min 350/500 60 kg 2.0 K/ 2.5 Ca  AVG - No heparin  - Calcitriol  1 mcg PO three times per week - Cinacalcet  30 mg PO three times per week - Mircera 30 mcg IV q 2 weeks  - Venofer 50 mg IV weekly  Background: Ashley Freeman is an 33 y.o. female with ESRD on HD, DM, HFrEF, anemia, depression, DM type 1, HL currently being admitted with CP and nephrology is consulted for comanagement of ESRD and assoc conditions. Poor compliance with HD.   Assessment/Plan:  Abd pain:  distended. Suspected ascites on admission.S/P paracentesis 10/13/2023 Yield 2.7 Liters. Per primary    Acute on chronic HFrEF: in the setting of poor dialysis attendance. Small bilateral pleural effusion per CT.  Serial HD to lower volume, ascites  mgmt per above. Optimize volume with HD.    ESRD on HD: TTS [address] [facility].  Will need orders Mon.  HD here Sat limited UF due to low BP. HD off schedule today, then short HD 10/15/2023 to resume T,Th,S schedule     Anemia of CKD: Hb 10.8   Secondary hyperparathyroidism:  cont outpt binders, calcitriol . Renal diet.    HTN/volume: cont home meds, UF with HD. BP low - decrease losartan .  Hold meds pre HD. Decrease carvedilol  to 12.5 mg PO BID. Try midodrine 10 mg PO  with HD    DM type 1: meds per primary     Loralie Malta H. Morrisa Aldaba NP-C 10/14/2023, 10:14 AM  [employer] [phone]

## 2023-10-15 NOTE — Progress Notes (Signed)
 Late Note Entry- October 15, 2023 Pt was d/c yesterday afternoon. Contacted [facility] this morning to be advised of pt's d/c date and that pt should resume care today. D/C summary and last renal note faxed to clinic for continuation of care.   Lauraine Polite Renal Navigator [phone]

## 2023-10-16 LAB — BODY FLUID CULTURE W GRAM STAIN

## 2023-10-19 ENCOUNTER — Emergency Department (HOSPITAL_COMMUNITY)

## 2023-10-19 ENCOUNTER — Other Ambulatory Visit: Payer: Self-pay

## 2023-10-19 ENCOUNTER — Emergency Department (HOSPITAL_COMMUNITY)
Admission: EM | Admit: 2023-10-19 | Discharge: 2023-10-19 | Disposition: A | Discharge status: Home / Self Care | Attending: Student | Admitting: Student

## 2023-10-19 DIAGNOSIS — M79606 Pain in leg, unspecified: Secondary | ICD-10-CM | POA: Diagnosis present

## 2023-10-19 DIAGNOSIS — Z992 Dependence on renal dialysis: Secondary | ICD-10-CM | POA: Insufficient documentation

## 2023-10-19 DIAGNOSIS — E1022 Type 1 diabetes mellitus with diabetic chronic kidney disease: Secondary | ICD-10-CM | POA: Insufficient documentation

## 2023-10-19 DIAGNOSIS — N186 End stage renal disease: Secondary | ICD-10-CM | POA: Diagnosis not present

## 2023-10-19 DIAGNOSIS — Z79899 Other long term (current) drug therapy: Secondary | ICD-10-CM | POA: Insufficient documentation

## 2023-10-19 DIAGNOSIS — I1311 Hypertensive heart and chronic kidney disease without heart failure, with stage 5 chronic kidney disease, or end stage renal disease: Secondary | ICD-10-CM | POA: Insufficient documentation

## 2023-10-19 DIAGNOSIS — D72829 Elevated white blood cell count, unspecified: Secondary | ICD-10-CM | POA: Diagnosis not present

## 2023-10-19 LAB — COMPREHENSIVE METABOLIC PANEL WITH GFR
ALT: 33 U/L (ref 0–44)
AST: 31 U/L (ref 15–41)
Albumin: 2.5 g/dL — ABNORMAL LOW (ref 3.5–5.0)
Alkaline Phosphatase: 533 U/L — ABNORMAL HIGH (ref 38–126)
Anion gap: 14 (ref 5–15)
BUN: 51 mg/dL — ABNORMAL HIGH (ref 6–20)
CO2: 23 mmol/L (ref 22–32)
Calcium: 8.1 mg/dL — ABNORMAL LOW (ref 8.9–10.3)
Chloride: 100 mmol/L (ref 98–111)
Creatinine, Ser: 7.81 mg/dL — ABNORMAL HIGH (ref 0.44–1.00)
GFR, Estimated: 7 mL/min — ABNORMAL LOW (ref >60)
Glucose, Bld: 377 mg/dL — ABNORMAL HIGH (ref 70–99)
Potassium: 4.4 mmol/L (ref 3.5–5.1)
Sodium: 137 mmol/L (ref 135–145)
Total Bilirubin: 0.7 mg/dL (ref 0.0–1.2)
Total Protein: 5.1 g/dL — ABNORMAL LOW (ref 6.5–8.1)

## 2023-10-19 LAB — CBC WITH DIFFERENTIAL/PLATELET
Abs Immature Granulocytes: 0.06 10*3/uL (ref 0.00–0.07)
Basophils Absolute: 0.1 10*3/uL (ref 0.0–0.1)
Basophils Relative: 1 %
Eosinophils Absolute: 0.7 10*3/uL — ABNORMAL HIGH (ref 0.0–0.5)
Eosinophils Relative: 7 %
HCT: 35.6 % — ABNORMAL LOW (ref 36.0–46.0)
Hemoglobin: 10.8 g/dL — ABNORMAL LOW (ref 12.0–15.0)
Immature Granulocytes: 1 %
Lymphocytes Relative: 28 %
Lymphs Abs: 3 10*3/uL (ref 0.7–4.0)
MCH: 27.8 pg (ref 26.0–34.0)
MCHC: 30.3 g/dL (ref 30.0–36.0)
MCV: 91.5 fL (ref 80.0–100.0)
Monocytes Absolute: 0.9 10*3/uL (ref 0.1–1.0)
Monocytes Relative: 9 %
Neutro Abs: 6 10*3/uL (ref 1.7–7.7)
Neutrophils Relative %: 54 %
Platelets: 179 10*3/uL (ref 150–400)
RBC: 3.89 MIL/uL (ref 3.87–5.11)
RDW: 15 % (ref 11.5–15.5)
WBC: 10.8 10*3/uL — ABNORMAL HIGH (ref 4.0–10.5)
nRBC: 0 % (ref 0.0–0.2)

## 2023-10-19 LAB — CBG MONITORING, ED
Glucose-Capillary: 354 mg/dL — ABNORMAL HIGH (ref 70–99)
Glucose-Capillary: 350 mg/dL — ABNORMAL HIGH (ref 70–99)

## 2023-10-19 MED ORDER — HYDROCODONE-ACETAMINOPHEN 5-325 MG PO TABS
2.0000 | ORAL_TABLET | Freq: Once | ORAL | Status: AC
  Administered 2023-10-19: 2 via ORAL

## 2023-10-19 MED ORDER — HYDROCODONE-ACETAMINOPHEN 5-325 MG PO TABS
2.0000 | ORAL_TABLET | Freq: Once | ORAL | Status: AC
  Administered 2023-10-19: 2 via ORAL
  Filled 2023-10-19: qty 2

## 2023-10-19 MED ORDER — INSULIN ASPART 100 UNIT/ML IJ SOLN: 0.0000 [IU] | Freq: Every day | INTRAMUSCULAR | Status: DC

## 2023-10-19 MED ORDER — CHLORHEXIDINE GLUCONATE CLOTH 2 % EX PADS: 6.0000 | MEDICATED_PAD | Freq: Every day | CUTANEOUS | Status: DC

## 2023-10-19 MED ORDER — INSULIN ASPART 100 UNIT/ML IJ SOLN
0.0000 [IU] | Freq: Three times a day (TID) | INTRAMUSCULAR | Status: DC
  Administered 2023-10-19: 4 [IU] via SUBCUTANEOUS

## 2023-10-19 MED ORDER — HYDROCODONE-ACETAMINOPHEN 5-325 MG PO TABS
ORAL_TABLET | ORAL | Status: AC
  Filled 2023-10-19: qty 2

## 2023-10-19 NOTE — Discharge Instructions (Signed)
 Continue your current medications.  Follow up with your doctors at dialysis.

## 2023-10-19 NOTE — ED Provider Notes (Signed)
 [place] EMERGENCY DEPARTMENT AT [facility] Provider Note  CSN: [id number] Arrival date & time: 10/19/23 9486  Chief Complaint(s) Leg Swelling  HPI Ashley Freeman is a 33 y.o. female with PMH ESRD on hemodialysis Tuesday Thursday Saturday, bipolar disorder, type 1 diabetes, CHF who presents emerged apartment for evaluation of leg pain and swelling.  Recent hospitalization from 6/14 to 10/14/2023 for anasarca and fluid overload.  States that since leaving the hospital she had approximately 3 days where she felt well but it started to swell again in her lower extremities with persistent pain.  She did not go to dialysis yesterday because she did not feel well.   Past Medical History Past Medical History:  Diagnosis Date   Abscess, gluteal, right 08/24/2013   Anemia 02/19/2012   Bartholin's gland abscess 09/19/2013   Bipolar disorder (HCC)    BV (bacterial vaginosis) 11/24/2015   Depression    Diabetes mellitus type I (HCC) 2001   Diagnosed at age 89 ; Type I   Diarrhea 05/30/2016   DKA (diabetic ketoacidoses) 08/19/2013   Also in 2018   ESRD (end stage renal disease) (HCC)    Gonorrhea 08/2011   Treated in 09/2011   HFrEF (heart failure with reduced ejection fraction) (HCC)    a. 2022 Echo: EF 40%; b. 10/2021 Echo: EF 55%; b. 07/2022 MV: No ischemia. EF 31%; c. 08/2022 Echo: EF 35%, mildly dil RV, sev TR.   History of trichomoniasis 05/31/2016   Hyperlipidemia 03/28/2016   Hypertension    NICM (nonischemic cardiomyopathy) (HCC)    Sepsis (HCC) 09/19/2013   Patient Active Problem List   Diagnosis Date Noted   Acute on chronic diastolic heart failure (HCC) 10/12/2023   Generalized abdominal pain 08/27/2023   Elevated LFTs 07/20/2023   Cholecystitis 06/30/2023   Prolonged QT interval 06/30/2023   Bipolar disorder (HCC) 06/30/2023   Hyperglycemic crisis due to Type 1 diabetes mellitus (HCC) 05/20/2023   HFrEF (heart failure with reduced ejection fraction)  (HCC) 05/20/2023   Chronic combined systolic and diastolic heart failure (HCC) 05/20/2023   Generalized pain 05/20/2023   Hypoglycemia 09/08/2022   Dilated cardiomyopathy (HCC) 09/08/2022   Seizures (HCC) 09/08/2022   ESRD on hemodialysis (HCC) 09/08/2022   Altered mental status 09/07/2022   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 difficile diarrhea 11/15/2021   SIRS (systemic inflammatory response syndrome) (HCC) 11/06/2021   LGSIL on Pap smear of cervix 09/24/2021   Stage 3b chronic kidney disease (HCC) 08/14/2021   DKA (diabetic ketoacidosis) (HCC) 07/26/2021   Essential hypertension 07/26/2021   Acute renal failure superimposed on stage 3b chronic kidney disease (HCC) 06/20/2021   N&V (nausea and vomiting) 06/20/2021   AKI (acute kidney injury) (HCC) 06/20/2021   Type 1 diabetes mellitus with chronic kidney disease on chronic dialysis (HCC) 05/26/2021   Dyslipidemia 05/26/2021   Anasarca 04/16/2021   Mild protein malnutrition (HCC) 03/07/2021   DKA, type 1 (HCC) 12/08/2020   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 diabetes mellitus (HCC) 09/17/2020   Type 1 diabetes mellitus (HCC) 02/08/2020   Systolic ejection murmur 08/03/2019   Type  1 diabetes mellitus with hyperglycemia (HCC) 07/24/2019   Diabetic retinopathy (HCC) 07/02/2019   Proteinuria due to type 1 diabetes mellitus (HCC) 05/21/2019   Diarrhea 05/31/2016   Diabetic neuropathy (HCC) 01/16/2016   Preop cardiovascular exam 08/24/2013   Pseudohyponatremia 08/04/2012   Hypokalemia 05/07/2012   Lactic acidosis 02/19/2012   Leukocytosis 02/19/2012   Normocytic anemia 02/19/2012    Hyperkalemia 02/19/2012   Stable proliferative diabetic retinopathy associated with type 1 diabetes mellitus (HCC) 10/13/1997   Home Medication(s) Prior to Admission medications   Medication Sig Start Date End Date Taking? Authorizing Provider  acetaminophen  (TYLENOL ) 500 MG tablet Take 1,000 mg by mouth as needed for mild pain (pain score 1-3) or moderate pain (pain score 4-6).    [provider]  albuterol  (VENTOLIN  HFA) 108 (90 Base) MCG/ACT inhaler Inhale 2 puffs into the lungs every 4 (four) hours as needed for wheezing or shortness of breath. 11/24/22   [provider]  atorvastatin  (LIPITOR) 80 MG tablet Take 1 tablet (80 mg total) by mouth daily. 10/15/23   Danford, Lonni SQUIBB, MD  bumetanide  (BUMEX ) 2 MG tablet Take 2 mg by mouth daily.    [provider]  calcitRIOL  (ROCALTROL ) 0.25 MCG capsule Take 5 capsules (1.25 mcg total) by mouth every Tuesday, Thursday, and Saturday at 6 PM. 07/09/23   Cindy Garnette POUR, MD  carvedilol  (COREG ) 25 MG tablet Take 25 mg by mouth 2 (two) times daily with a meal. 05/15/23   [provider]  Continuous Glucose Sensor (DEXCOM G7 SENSOR) MISC Change sensors every 10 days 09/07/22   Shamleffer, Ibtehal Jaralla, MD  cyclobenzaprine  (FLEXERIL ) 5 MG tablet Take 1 tablet (5 mg total) by mouth 3 (three) times daily as needed for muscle spasms. 08/31/23   Ghimire, Donalda HERO, MD  DULoxetine  (CYMBALTA ) 20 MG capsule Take 20 mg by mouth daily. 07/29/23   [provider]  famotidine  (PEPCID ) 20 MG tablet Take 20 mg by mouth 2 (two) times daily. 07/29/23   [provider]  hydrOXYzine  (ATARAX ) 25 MG tablet Take 25 mg by mouth every 6 (six) hours as needed for anxiety or itching. 05/14/23   [provider]  insulin  aspart (NOVOLOG ) 100 UNIT/ML injection Inject 5 Units into the skin 3 (three) times daily as needed for high blood sugar.    [provider]  lamoTRIgine  (LAMICTAL ) 200 MG tablet Take 200 mg by  mouth daily. 07/11/23   [provider]  LANTUS  SOLOSTAR 100 UNIT/ML Solostar Pen Inject 10 Units into the skin at bedtime.    [provider]  LORazepam  (ATIVAN ) 1 MG tablet Take 1 mg by mouth daily as needed. 07/11/23   [provider]  losartan  (COZAAR ) 100 MG tablet Take 150 mg by mouth daily. 03/25/23   [provider]  methylphenidate 18 MG PO CR tablet Take 18 mg by mouth every morning. 08/21/23   [provider]  metoCLOPramide  (REGLAN ) 10 MG tablet Take 1 tablet (10 mg total) by mouth every 8 (eight) hours as needed for nausea. 10/14/23   Danford, Lonni SQUIBB, MD  OLANZapine  zydis (ZYPREXA ) 5 MG disintegrating tablet Take 5 mg by mouth at bedtime. 09/23/23 12/22/23  [provider]  ondansetron  (ZOFRAN -ODT) 4 MG disintegrating tablet Take 1 tablet (4 mg total) by mouth every 8 (eight) hours as needed for nausea or vomiting. 06/23/23   Davis, Jonathon H, MD  Pancrelipase , Lip-Prot-Amyl, 3000-9500 units CPEP Take 3,000 units of lipase by mouth 3 (three) times daily. 09/23/23  12/22/23  [provider]  prochlorperazine  (COMPAZINE ) 5 MG tablet Take 5 mg by mouth every 6 (six) hours as needed for nausea. 09/23/23   [provider]  sevelamer  carbonate (RENVELA ) 800 MG tablet Take 2,400 mg by mouth 3 (three) times daily with meals.    [provider]  sodium bicarbonate  650 MG tablet Take 1,300 mg by mouth 2 (two) times daily. 07/29/23   [provider]  SUMAtriptan  (IMITREX ) 50 MG tablet Take 50 mg by mouth every 2 (two) hours as needed for migraine or headache. 06/20/22   [provider]                                                                                                                                    Past Surgical History Past Surgical History:  Procedure Laterality Date   A/V FISTULAGRAM Right 06/17/2023   Procedure: A/V Fistulagram;  Surgeon: Marea Selinda RAMAN, MD;  Location: [facility] INVASIVE CV  LAB;  Service: Cardiovascular;  Laterality: Right;   A/V SHUNT INTERVENTION N/A 09/25/2023   Procedure: A/V SHUNT INTERVENTION;  Surgeon: Pearline Norman RAMAN, MD;  Location: [facility] PV LAB;  Service: Cardiovascular;  Laterality: N/A;   AV FISTULA PLACEMENT Right 07/06/2022   Procedure: ARTERIOVENOUS GRAFT CREATION;  Surgeon: Gretta Lonni PARAS, MD;  Location: [facility] OR;  Service: Vascular;  Laterality: Right;   CESAREAN SECTION N/A 10/05/2019   Procedure: CESAREAN SECTION;  Surgeon: Izell Harari, MD;  Location: [facility] LD ORS;  Service: Obstetrics;  Laterality: N/A;   CHOLECYSTECTOMY N/A 07/02/2023   Procedure: LAPAROSCOPIC CHOLECYSTECTOMY;  Surgeon: Ebbie Cough, MD;  Location: [facility] OR;  Service: General;  Laterality: N/A;   INCISION AND DRAINAGE ABSCESS Left 09/28/2019   Procedure: INCISION AND DRAINAGE VULVAR ABCESS;  Surgeon: Edsel Norleen GAILS, MD;  Location: [facility] OR;  Service: Gynecology;  Laterality: Left;   INCISION AND DRAINAGE PERIRECTAL ABSCESS Right 08/18/2013   Procedure: IRRIGATION AND DEBRIDEMENT GLUTEAL ABSCESS;  Surgeon: Lynda Leos, MD;  Location: [facility] OR;  Service: General;  Laterality: Right;   INCISION AND DRAINAGE PERIRECTAL ABSCESS Right 09/19/2013   Procedure: IRRIGATION AND DEBRIDEMENT RIGHT GLUTEAL AND LABIAL ABSCESSES;  Surgeon: Lynda Leos, MD;  Location: [facility] OR;  Service: General;  Laterality: Right;   INCISION AND DRAINAGE PERIRECTAL ABSCESS Right 09/24/2013   Procedure: IRRIGATION AND DEBRIDEMENT PERIRECTAL ABSCESS;  Surgeon: Lynwood MALVA Pina, MD;  Location: [facility] OR;  Service: General;  Laterality: Right;   IR PARACENTESIS  08/28/2023   Family History Family History  Problem Relation Age of Onset   Asthma Mother    Carpal tunnel syndrome Mother    Gout Father    Diabetes Paternal Grandmother    Anesthesia problems Neg Hx     Social History Social History   Tobacco Use   Smoking status: Never    Passive exposure: Never   Smokeless tobacco: Never  Vaping Use    Vaping status: Never  Used  Substance Use Topics   Alcohol use: Not Currently   Drug use: No   Allergies Cephalexin , Morphine , Penicillins, Fish allergy, Benadryl  [diphenhydramine ], Dilaudid  [hydromorphone ], Doxycycline , Methotrexate derivatives, and Oxycodone   Review of Systems Review of Systems  Cardiovascular:  Positive for leg swelling.  Musculoskeletal:  Positive for arthralgias and myalgias.    Physical Exam Vital Signs  I have reviewed the triage vital signs BP (!) 170/87   Pulse 90   Temp 98.3 F (36.8 C) (Oral)   Resp 18   Ht 5' 3 (1.6 m)   Wt 61.7 kg   LMP  (LMP Unknown)   SpO2 94%   BMI 24.09 kg/m   Physical Exam Vitals and nursing note reviewed.  Constitutional:      General: She is not in acute distress.    Appearance: She is well-developed.  HENT:     Head: Normocephalic and atraumatic.   Eyes:     Conjunctiva/sclera: Conjunctivae normal.    Cardiovascular:     Rate and Rhythm: Normal rate and regular rhythm.     Heart sounds: No murmur heard. Pulmonary:     Effort: Pulmonary effort is normal. No respiratory distress.     Breath sounds: Normal breath sounds.  Abdominal:     Palpations: Abdomen is soft.     Tenderness: There is no abdominal tenderness.   Musculoskeletal:        General: Tenderness present. No swelling.     Cervical back: Neck supple.     Right lower leg: Edema present.     Left lower leg: Edema present.   Skin:    General: Skin is warm and dry.     Capillary Refill: Capillary refill takes less than 2 seconds.   Neurological:     Mental Status: She is alert.   Psychiatric:        Mood and Affect: Mood normal.     ED Results and Treatments Labs (all labs ordered are listed, but only abnormal results are displayed) Labs Reviewed  COMPREHENSIVE METABOLIC PANEL WITH GFR - Abnormal; Notable for the following components:      Result Value   Glucose, Bld 377 (*)    BUN 51 (*)    Creatinine, Ser 7.81 (*)    Calcium  8.1  (*)    Total Protein 5.1 (*)    Albumin  2.5 (*)    Alkaline Phosphatase 533 (*)    GFR, Estimated 7 (*)    All other components within normal limits  CBC WITH DIFFERENTIAL/PLATELET - Abnormal; Notable for the following components:   WBC 10.8 (*)    Hemoglobin 10.8 (*)    HCT 35.6 (*)    Eosinophils Absolute 0.7 (*)    All other components within normal limits  CBG MONITORING, ED - Abnormal; Notable for the following components:   Glucose-Capillary 354 (*)    All other components within normal limits  Radiology DG Chest Port 1 View Result Date: 10/19/2023 CLINICAL DATA:  Shortness of breath EXAM: PORTABLE CHEST 1 VIEW COMPARISON:  Chest x-ray performed October 12, 2023 FINDINGS: Low lung volumes. Enlarged heart with interstitial airspace opacities. Suspected small pleural effusions. IMPRESSION: 1. Enlarged heart with low lung volumes. 2. Interstitial edema with suspected small pleural effusions. Electronically Signed   By: Maude Naegeli M.D.   On: 10/19/2023 06:49    Pertinent labs & imaging results that were available during my care of the patient were reviewed by me and considered in my medical decision making (see MDM for details).  Medications Ordered in ED Medications  Chlorhexidine  Gluconate Cloth 2 % PADS 6 each (has no administration in time range)  HYDROcodone -acetaminophen  (NORCO/VICODIN) 5-325 MG per tablet 2 tablet (2 tablets Oral Given 10/19/23 0733)                                                                                                                                     Procedures Procedures  (including critical care time)  Medical Decision Making / ED Course   This patient presents to the ED for concern of lower extremity pain and swelling, this involves an extensive number of treatment options, and is a complaint that carries with it a high  risk of complications and morbidity.  The differential diagnosis includes fluid overload, ESRD, electrolyte abnormality, calciphylaxis, DVT  MDM: Patient seen emergency room for lower extremity pain and swelling.  Physical exam with severe tenderness bilaterally and pitting edema in the lower extremities.  Laboratory evaluation with mild leukocytosis to 10.8, hemoglobin 10.8, BUN 51, creatinine 7.81, glucose 3 77 but bicarb is normal with a normal anion gap, chest x-ray with small pleural effusions but otherwise unremarkable.  Presentation likely consistent with fluid overload and pain given missed dialysis appointment.  I spoke with the nephrologist on-call Dr. Prescilla who will help arrange dialysis after lunch today on 10/19/2023.  Patient will stay in the emergency department and return to the emergency department after dialysis.  If her symptoms are still uncontrolled or she has any hypoxia she will require hospital admission but we will attempt to save hospital admission today by reevaluation in the emergency department after dialysis.  Please see provider signout note for continuation of workup.   Additional history obtained:  -External records from outside source obtained and reviewed including: Chart review including previous notes, labs, imaging, consultation notes   Lab Tests: -I ordered, reviewed, and interpreted labs.   The pertinent results include:   Labs Reviewed  COMPREHENSIVE METABOLIC PANEL WITH GFR - Abnormal; Notable for the following components:      Result Value   Glucose, Bld 377 (*)    BUN 51 (*)    Creatinine, Ser 7.81 (*)    Calcium  8.1 (*)    Total Protein 5.1 (*)    Albumin  2.5 (*)    Alkaline Phosphatase 533 (*)  GFR, Estimated 7 (*)    All other components within normal limits  CBC WITH DIFFERENTIAL/PLATELET - Abnormal; Notable for the following components:   WBC 10.8 (*)    Hemoglobin 10.8 (*)    HCT 35.6 (*)    Eosinophils Absolute 0.7 (*)    All  other components within normal limits  CBG MONITORING, ED - Abnormal; Notable for the following components:   Glucose-Capillary 354 (*)    All other components within normal limits      EKG   EKG Interpretation Date/Time:  Saturday October 19 2023 05:29:21 EDT Ventricular Rate:  93 PR Interval:  145 QRS Duration:  130 QT Interval:  380 QTC Calculation: 473 R Axis:   44  Text Interpretation: Sinus rhythm Probable left atrial enlargement Confirmed by Richelle Glick ([id number]) on 10/19/2023 8:37:41 AM         Imaging Studies ordered: I ordered imaging studies including chest x-ray I independently visualized and interpreted imaging. I agree with the radiologist interpretation   Medicines ordered and prescription drug management: Meds ordered this encounter  Medications   HYDROcodone -acetaminophen  (NORCO/VICODIN) 5-325 MG per tablet 2 tablet    Refill:  0   Chlorhexidine  Gluconate Cloth 2 % PADS 6 each    -I have reviewed the patients home medicines and have made adjustments as needed  Critical interventions none  Consultations Obtained: I requested consultation with the nephrologist on-call Dr. Prescilla,  and discussed lab and imaging findings as well as pertinent plan - they recommend: Dialysis later today   Cardiac Monitoring: The patient was maintained on a cardiac monitor.  I personally viewed and interpreted the cardiac monitored which showed an underlying rhythm of: NSR  Social Determinants of Health:  Factors impacting patients care include: none   Reevaluation: After the interventions noted above, I reevaluated the patient and found that they have :improved  Co morbidities that complicate the patient evaluation  Past Medical History:  Diagnosis Date   Abscess, gluteal, right 08/24/2013   Anemia 02/19/2012   Bartholin's gland abscess 09/19/2013   Bipolar disorder (HCC)    BV (bacterial vaginosis) 11/24/2015   Depression    Diabetes mellitus type I  (HCC) 2001   Diagnosed at age 69 ; Type I   Diarrhea 05/30/2016   DKA (diabetic ketoacidoses) 08/19/2013   Also in 2018   ESRD (end stage renal disease) (HCC)    Gonorrhea 08/2011   Treated in 09/2011   HFrEF (heart failure with reduced ejection fraction) (HCC)    a. 2022 Echo: EF 40%; b. 10/2021 Echo: EF 55%; b. 07/2022 MV: No ischemia. EF 31%; c. 08/2022 Echo: EF 35%, mildly dil RV, sev TR.   History of trichomoniasis 05/31/2016   Hyperlipidemia 03/28/2016   Hypertension    NICM (nonischemic cardiomyopathy) (HCC)    Sepsis (HCC) 09/19/2013      Dispostion: I considered admission for this patient, but at this time we will opt for dialysis and reevaluation the emergency department in an attempt to save the hospital admission     Final Clinical Impression(s) / ED Diagnoses Final diagnoses:  None     @PCDICTATION @    Albertina Dixon, MD 10/19/23 [phone]

## 2023-10-19 NOTE — ED Notes (Signed)
EDP into room, at BS.  ?

## 2023-10-19 NOTE — ED Provider Notes (Signed)
 Pt was seen by Dr Albertina.  Please see his note.  Pt has returned from dialysis.  She is feeling better.  Her leg discomfort has decreased.  Pt is comfortable with discharge.   Randol Simmonds, MD 10/19/23 [phone]

## 2023-10-19 NOTE — Progress Notes (Signed)
 [facility] Quick Note  S: Called that she was in ED with concern of worsened LE edema. Reports everywhere hurts but not specific symptoms. Dialyzes on TTS schedule at [facility] and missed HD today to come to ED.  O: Blood pressure (!) 161/95, pulse 87, temperature (P) 98.5 F (36.9 C), resp. rate 14, height 5' 3 (1.6 m), weight 61.7 kg, SpO2 94%.   Gen: Chronically overloaded, drowsy but answering questions. On room air. CV: RRR Pulm: CTA anteriorly Abd: tense, anasarca Extr: 4+ BLE edema Access: RUE AVF +t/b  A/P: ESRD Chronic fluid overload/anasarca  Plan is for HD today, try 4L UFG. She is not hypoxic nor requiring O2, so likely ok for discharge after today's treatment. Explained that needs to go to every outpatient HD to get her edema down as well as follow fluid restrictions.  Pls page if plan changes and will be admitted.  Izetta Boehringer, PA-C [employer] Pager [phone]

## 2023-10-19 NOTE — Progress Notes (Signed)
 Received patient in bed to unit.  Alert and oriented.  Informed consent signed and in chart.   TX duration:3 hours  Patient tolerated well.  Transported back to the room  Alert, without acute distress.  Hand-off given to patient's nurse.   Access used: Right graft upper arm Access issues: none  Total UF removed: 4L Medication(s) given: Hydrocodone /acetaminophen    10/19/23 1433  Vitals  Temp 98.4 F (36.9 C)  Temp Source Oral  BP (!) 168/95  MAP (mmHg) 117  Pulse Rate 92  ECG Heart Rate 92  Resp 15  Oxygen Therapy  SpO2 97 %  O2 Device Room Air  During Treatment Monitoring  Duration of HD Treatment -hour(s) 3 hour(s)  HD Safety Checks Performed Yes  Intra-Hemodialysis Comments Tx completed  Dialysis Fluid Bolus Normal Saline  Bolus Amount (mL) 300 mL  Post Treatment  Dialyzer Clearance Clear  Liters Processed 72  Fluid Removed (mL) 4000 mL  Tolerated HD Treatment Yes  Post-Hemodialysis Comments goal met.  tolerated well  AVG/AVF Arterial Site Held (minutes) 7 minutes  AVG/AVF Venous Site Held (minutes) 7 minutes  Fistula / Graft Right Upper arm Arteriovenous vein graft  Placement Date/Time: 07/06/22 1013   Placed prior to admission: No  Orientation: Right  Access Location: Upper arm  Access Type: Arteriovenous vein graft  Status Deaccessed     Camellia Brasil LPN [facility]

## 2023-10-19 NOTE — ED Triage Notes (Signed)
 Patient brought in by EMS from home stating she was having bilateral lower leg swelling. Patient is a dialysis patient and did not do dialysis on Thursday. Patient was recently seen for the same thing and discharged on Monday. Patient alert and oriented x4.

## 2023-10-20 ENCOUNTER — Other Ambulatory Visit: Payer: Self-pay

## 2023-10-20 ENCOUNTER — Emergency Department (HOSPITAL_COMMUNITY)
Admission: EM | Admit: 2023-10-20 | Discharge: 2023-10-21 | Disposition: A | Discharge status: Home / Self Care | Attending: Emergency Medicine | Admitting: Emergency Medicine

## 2023-10-20 ENCOUNTER — Emergency Department (HOSPITAL_COMMUNITY)

## 2023-10-20 DIAGNOSIS — Z992 Dependence on renal dialysis: Secondary | ICD-10-CM | POA: Insufficient documentation

## 2023-10-20 DIAGNOSIS — E11649 Type 2 diabetes mellitus with hypoglycemia without coma: Secondary | ICD-10-CM | POA: Insufficient documentation

## 2023-10-20 DIAGNOSIS — Z79899 Other long term (current) drug therapy: Secondary | ICD-10-CM | POA: Insufficient documentation

## 2023-10-20 DIAGNOSIS — T68XXXA Hypothermia, initial encounter: Secondary | ICD-10-CM | POA: Diagnosis not present

## 2023-10-20 DIAGNOSIS — I132 Hypertensive heart and chronic kidney disease with heart failure and with stage 5 chronic kidney disease, or end stage renal disease: Secondary | ICD-10-CM | POA: Insufficient documentation

## 2023-10-20 DIAGNOSIS — I509 Heart failure, unspecified: Secondary | ICD-10-CM | POA: Insufficient documentation

## 2023-10-20 DIAGNOSIS — N186 End stage renal disease: Secondary | ICD-10-CM | POA: Insufficient documentation

## 2023-10-20 DIAGNOSIS — E1122 Type 2 diabetes mellitus with diabetic chronic kidney disease: Secondary | ICD-10-CM | POA: Insufficient documentation

## 2023-10-20 DIAGNOSIS — Z794 Long term (current) use of insulin: Secondary | ICD-10-CM | POA: Insufficient documentation

## 2023-10-20 DIAGNOSIS — D631 Anemia in chronic kidney disease: Secondary | ICD-10-CM | POA: Insufficient documentation

## 2023-10-20 DIAGNOSIS — N189 Chronic kidney disease, unspecified: Secondary | ICD-10-CM

## 2023-10-20 DIAGNOSIS — E162 Hypoglycemia, unspecified: Secondary | ICD-10-CM | POA: Diagnosis present

## 2023-10-20 LAB — CBG MONITORING, ED
Glucose-Capillary: 147 mg/dL — ABNORMAL HIGH (ref 70–99)
Glucose-Capillary: 182 mg/dL — ABNORMAL HIGH (ref 70–99)
Glucose-Capillary: 212 mg/dL — ABNORMAL HIGH (ref 70–99)

## 2023-10-20 LAB — CBC WITH DIFFERENTIAL/PLATELET
Abs Immature Granulocytes: 0.03 10*3/uL (ref 0.00–0.07)
Basophils Absolute: 0.1 10*3/uL (ref 0.0–0.1)
Basophils Relative: 1 %
Eosinophils Absolute: 0.7 10*3/uL — ABNORMAL HIGH (ref 0.0–0.5)
Eosinophils Relative: 8 %
HCT: 36.1 % (ref 36.0–46.0)
Hemoglobin: 10.7 g/dL — ABNORMAL LOW (ref 12.0–15.0)
Immature Granulocytes: 0 %
Lymphocytes Relative: 27 %
Lymphs Abs: 2.3 10*3/uL (ref 0.7–4.0)
MCH: 27.5 pg (ref 26.0–34.0)
MCHC: 29.6 g/dL — ABNORMAL LOW (ref 30.0–36.0)
MCV: 92.8 fL (ref 80.0–100.0)
Monocytes Absolute: 0.9 10*3/uL (ref 0.1–1.0)
Monocytes Relative: 10 %
Neutro Abs: 4.7 10*3/uL (ref 1.7–7.7)
Neutrophils Relative %: 54 %
Platelets: 174 10*3/uL (ref 150–400)
RBC: 3.89 MIL/uL (ref 3.87–5.11)
RDW: 14.7 % (ref 11.5–15.5)
WBC: 8.7 10*3/uL (ref 4.0–10.5)
nRBC: 0 % (ref 0.0–0.2)

## 2023-10-20 LAB — COMPREHENSIVE METABOLIC PANEL WITH GFR
ALT: 39 U/L (ref 0–44)
AST: 61 U/L — ABNORMAL HIGH (ref 15–41)
Albumin: 2.6 g/dL — ABNORMAL LOW (ref 3.5–5.0)
Alkaline Phosphatase: 628 U/L — ABNORMAL HIGH (ref 38–126)
Anion gap: 11 (ref 5–15)
BUN: 39 mg/dL — ABNORMAL HIGH (ref 6–20)
CO2: 28 mmol/L (ref 22–32)
Calcium: 8.6 mg/dL — ABNORMAL LOW (ref 8.9–10.3)
Chloride: 99 mmol/L (ref 98–111)
Creatinine, Ser: 6.7 mg/dL — ABNORMAL HIGH (ref 0.44–1.00)
GFR, Estimated: 8 mL/min — ABNORMAL LOW (ref >60)
Glucose, Bld: 161 mg/dL — ABNORMAL HIGH (ref 70–99)
Potassium: 4.3 mmol/L (ref 3.5–5.1)
Sodium: 138 mmol/L (ref 135–145)
Total Bilirubin: 0.9 mg/dL (ref 0.0–1.2)
Total Protein: 5.2 g/dL — ABNORMAL LOW (ref 6.5–8.1)

## 2023-10-20 MED ORDER — HYDROMORPHONE HCL 1 MG/ML IJ SOLN
1.0000 mg | Freq: Once | INTRAMUSCULAR | Status: AC
  Administered 2023-10-20: 1 mg via INTRAVENOUS
  Filled 2023-10-20: qty 1

## 2023-10-20 NOTE — ED Triage Notes (Signed)
 Pt BIB [facility] from home d/t concerns for unresponsiveness. Was found to have an initial CBG of 48. Given 1 mg glucagon  left thigh. Repeat CBG 40. 18G left EJ placed. Given 12.5 D10 IV. Pt increased responsiveness on arrival.   Per note pt is Tues/Thur/Sat Dialysis. Last dialysis yesterday.

## 2023-10-20 NOTE — ED Notes (Signed)
 XR at bedside

## 2023-10-20 NOTE — ED Provider Notes (Signed)
 [facility] EMERGENCY DEPARTMENT AT [facility] Provider Note   CSN: [id number] Arrival date & time: 10/20/23  2230     Patient presents with: Unresponsive and Hypoglycemia   Ashley Freeman is a 33 y.o. female.  {Add pertinent medical, surgical, social history, OB history to YEP:67052} The history is provided by the EMS personnel and the patient. The history is limited by the condition of the patient (Altered mental status).  Hypoglycemia She has history of hypertension, diabetes, hyperlipidemia, end-stage renal disease on hemodialysis (Tuesday-Thursday-Saturday), bipolar disorder, heart failure with reduced ejection fraction and was brought in by ambulance because she was found to be unresponsive.  EMS noted hypoglycemia with initial glucose of 48 which actually decreased to 40 after glucagon  administration.  She then received intravenous glucose and started to become more awake.  She is complaining of hurting everywhere, which is a chronic complaint for her.   Prior to Admission medications   Medication Sig Start Date End Date Taking? Authorizing Provider  acetaminophen  (TYLENOL ) 500 MG tablet Take 1,000 mg by mouth as needed for mild pain (pain score 1-3) or moderate pain (pain score 4-6).    [provider]  albuterol  (VENTOLIN  HFA) 108 (90 Base) MCG/ACT inhaler Inhale 2 puffs into the lungs every 4 (four) hours as needed for wheezing or shortness of breath. 11/24/22   [provider]  atorvastatin  (LIPITOR) 80 MG tablet Take 1 tablet (80 mg total) by mouth daily. 10/15/23   Danford, Lonni SQUIBB, MD  bumetanide  (BUMEX ) 2 MG tablet Take 2 mg by mouth daily.    [provider]  calcitRIOL  (ROCALTROL ) 0.25 MCG capsule Take 5 capsules (1.25 mcg total) by mouth every Tuesday, Thursday, and Saturday at 6 PM. 07/09/23   Cindy Garnette POUR, MD  carvedilol  (COREG ) 25 MG tablet Take 25 mg by mouth 2 (two) times daily with a meal. 05/15/23   [provider]  Continuous Glucose Sensor (DEXCOM G7 SENSOR) MISC Change sensors every 10 days 09/07/22   Shamleffer, Ibtehal Jaralla, MD  cyclobenzaprine  (FLEXERIL ) 5 MG tablet Take 1 tablet (5 mg total) by mouth 3 (three) times daily as needed for muscle spasms. 08/31/23   Ghimire, Donalda HERO, MD  DULoxetine  (CYMBALTA ) 20 MG capsule Take 20 mg by mouth daily. 07/29/23   [provider]  famotidine  (PEPCID ) 20 MG tablet Take 20 mg by mouth 2 (two) times daily. 07/29/23   [provider]  hydrOXYzine  (ATARAX ) 25 MG tablet Take 25 mg by mouth every 6 (six) hours as needed for anxiety or itching. 05/14/23   [provider]  insulin  aspart (NOVOLOG ) 100 UNIT/ML injection Inject 5 Units into the skin 3 (three) times daily as needed for high blood sugar.    [provider]  lamoTRIgine  (LAMICTAL ) 200 MG tablet Take 200 mg by mouth daily. 07/11/23   [provider]  LANTUS  SOLOSTAR 100 UNIT/ML Solostar Pen Inject 10 Units into the skin at bedtime.    [provider]  LORazepam  (ATIVAN ) 1 MG tablet Take 1 mg by mouth daily as needed. 07/11/23   [provider]  losartan  (COZAAR ) 100 MG tablet Take 150 mg by mouth daily. 03/25/23   [provider]  methylphenidate 18 MG PO CR tablet Take 18 mg by mouth every morning. 08/21/23   [provider]  metoCLOPramide  (REGLAN ) 10 MG tablet Take 1 tablet (10 mg total) by mouth every 8 (eight) hours as needed for nausea. 10/14/23   Danford, Lonni SQUIBB,  MD  OLANZapine  zydis (ZYPREXA ) 5 MG disintegrating tablet Take 5 mg by mouth at bedtime. 09/23/23 12/22/23  [provider]  ondansetron  (ZOFRAN -ODT) 4 MG disintegrating tablet Take 1 tablet (4 mg total) by mouth every 8 (eight) hours as needed for nausea or vomiting. 06/23/23   Davis, Jonathon H, MD  Pancrelipase , Lip-Prot-Amyl, 3000-9500 units CPEP Take 3,000 units of lipase by mouth 3 (three) times daily. 09/23/23 12/22/23  [provider]  prochlorperazine  (COMPAZINE ) 5 MG tablet Take 5 mg by mouth every 6 (six) hours as needed for nausea. 09/23/23   [provider]  sevelamer  carbonate (RENVELA ) 800 MG tablet Take 2,400 mg by mouth 3 (three) times daily with meals.    [provider]  sodium bicarbonate  650 MG tablet Take 1,300 mg by mouth 2 (two) times daily. 07/29/23   [provider]  SUMAtriptan  (IMITREX ) 50 MG tablet Take 50 mg by mouth every 2 (two) hours as needed for migraine or headache. 06/20/22   [provider]    Allergies: Cephalexin , Morphine , Penicillins, Fish allergy, Benadryl  [diphenhydramine ], Dilaudid  [hydromorphone ], Doxycycline , Methotrexate derivatives, and Oxycodone     Review of Systems  Unable to perform ROS: Mental status change    Updated Vital Signs BP (!) 147/89   Pulse 82   Resp 18   LMP  (LMP Unknown)   SpO2 100%   Physical Exam Vitals and nursing note reviewed.   33 year old female, resting comfortably and in no acute distress. Vital signs are significant for borderline elevated blood pressure, low temperature. Oxygen saturation is 100%, which is normal. Head is normocephalic and atraumatic. PERRLA, EOMI.  Neck is nontender and supple. Lungs are clear without rales, wheezes, or rhonchi. Chest is nontender. Heart has regular rate and rhythm without murmur. Abdomen is soft, flat, nontender. Extremities have 2+ pitting edema.  AV fistula present on the right arm with thrill present. Skin is warm and dry without rash. Neurologic: Sleepy but arousable, oriented to person and place.  Moves all extremities.  (all labs ordered are listed, but only abnormal results are displayed) Labs Reviewed  CBG MONITORING, ED - Abnormal; Notable for the following components:      Result Value   Glucose-Capillary 147 (*)    All other components within normal limits  CBG MONITORING, ED - Abnormal; Notable for the following components:   Glucose-Capillary  182 (*)    All other components within normal limits  CBC WITH DIFFERENTIAL/PLATELET  COMPREHENSIVE METABOLIC PANEL WITH GFR  TROPONIN I (HIGH SENSITIVITY)    EKG: EKG Interpretation Date/Time:  Sunday October 20 2023 22:45:58 EDT Ventricular Rate:  82 PR Interval:  146 QRS Duration:  133 QT Interval:  463 QTC Calculation: 541 R Axis:   30  Text Interpretation: Sinus rhythm Nonspecific intraventricular conduction delay Borderline T abnormalities, diffuse leads When compared with ECG of 10/19/2023, No significant change was found Confirmed by Raford Lenis ([id number]) on 10/20/2023 11:14:51 PM  Radiology: [facility] Chest Port 1 View Result Date: 10/19/2023 CLINICAL DATA:  Shortness of breath EXAM: PORTABLE CHEST 1 VIEW COMPARISON:  Chest x-ray performed October 12, 2023 FINDINGS: Low lung volumes. Enlarged heart with interstitial airspace opacities. Suspected small pleural effusions. IMPRESSION: 1. Enlarged heart with low lung volumes. 2. Interstitial edema with suspected small pleural effusions. Electronically Signed   By: Maude Naegeli M.D.   On: 10/19/2023 06:49    {Document cardiac monitor, telemetry assessment procedure when appropriate:32947} Procedures  Cardiac monitor shows normal sinus rhythm, per my  interpretation. Medications Ordered in the ED  HYDROmorphone  (DILAUDID ) injection 1 mg (1 mg Intravenous Given 10/20/23 2306)      {Click here for ABCD2, HEART and other calculators REFRESH Note before signing:1}                              Medical Decision Making  Altered mental status with unresponsiveness.  This is a presentation which has a wide range of treatment options and has a high risk for morbidity and complications.  Differential diagnosis includes, but is not limited to, hypoglycemia, stroke, intracerebral hemorrhage, metabolic derangement, hypoxia, diabetic hyperosmolar coma, ketoacidosis, sepsis.  Patient was documented to be hypoglycemic and is improving her mental status with her  glucose increasing.  Initial glucose on arrival to the ED was 182.  She is hypothermic which is likely related to her hypoglycemia and external warming has been initiated.  I reviewed her electrocardiogram, and my interpretation is nonspecific T wave changes, unchanged from prior.  {Document critical care time when appropriate  Document review of labs and clinical decision tools ie CHADS2VASC2, etc  Document your independent review of radiology images and any outside records  Document your discussion with family members, caretakers and with consultants  Document social determinants of health affecting pt's care  Document your decision making why or why not admission, treatments were needed:32947:::1}   Final diagnoses:  None    ED Discharge Orders     None

## 2023-10-20 NOTE — ED Provider Notes (Signed)
 MSE was initiated and I personally evaluated the patient and placed orders (if any) at  10:40 PM on October 20, 2023.  The patient appears stable so that the remainder of the MSE may be completed by another provider.  She has a history of end-stage renal disease gets dialysis Tuesday Thursday Saturday and last dialysis yesterday.  EMS was called for altered mental status.  Found to have a blood sugar of 44.  Given glucagon  and IJ was placed, given D10.  Becoming more alert on presentation.  Complaining of allover pain.  States she usually is given Dilaudid  for her pain.  I have ordered her to get some lab work EKG chest x-ray.  Pain medicine ordered.   Towana Ozell BROCKS, MD 10/21/23 [phone]

## 2023-10-21 LAB — CBG MONITORING, ED: Glucose-Capillary: 335 mg/dL — ABNORMAL HIGH (ref 70–99)

## 2023-10-21 LAB — TROPONIN I (HIGH SENSITIVITY)
Troponin I (High Sensitivity): 16 ng/L (ref <18)
Troponin I (High Sensitivity): 18 ng/L — ABNORMAL HIGH (ref <18)

## 2023-10-21 MED ORDER — HYDROCODONE-ACETAMINOPHEN 5-325 MG PO TABS
1.0000 | ORAL_TABLET | Freq: Once | ORAL | Status: AC
  Administered 2023-10-21: 1 via ORAL
  Filled 2023-10-21: qty 1

## 2023-10-21 NOTE — ED Notes (Signed)
 AVS provided by edp was reviewed with pt. Pt verbalized understanding with no additional questions at this time. Pt to go home with aunt who is on her way to pick her up from the lobby

## 2023-10-21 NOTE — Discharge Instructions (Addendum)
 Make sure that she are eating an appropriate diet and eating at regular intervals.  Check your blood sugar frequently.  Make sure that you have snacks that have an appropriate sugar load to bring your sugar up if it starts to drop.  Make sure to go for your scheduled dialysis tomorrow.

## 2023-10-23 ENCOUNTER — Encounter (HOSPITAL_COMMUNITY): Payer: Self-pay

## 2023-10-23 ENCOUNTER — Emergency Department (HOSPITAL_COMMUNITY)

## 2023-10-23 ENCOUNTER — Other Ambulatory Visit: Payer: Self-pay

## 2023-10-23 ENCOUNTER — Emergency Department (HOSPITAL_COMMUNITY)
Admission: EM | Admit: 2023-10-23 | Discharge: 2023-10-23 | Disposition: A | Discharge status: Home / Self Care | Attending: Emergency Medicine | Admitting: Emergency Medicine

## 2023-10-23 DIAGNOSIS — E8770 Fluid overload, unspecified: Secondary | ICD-10-CM | POA: Insufficient documentation

## 2023-10-23 DIAGNOSIS — Z992 Dependence on renal dialysis: Secondary | ICD-10-CM | POA: Insufficient documentation

## 2023-10-23 DIAGNOSIS — I132 Hypertensive heart and chronic kidney disease with heart failure and with stage 5 chronic kidney disease, or end stage renal disease: Secondary | ICD-10-CM | POA: Diagnosis not present

## 2023-10-23 DIAGNOSIS — N2581 Secondary hyperparathyroidism of renal origin: Secondary | ICD-10-CM | POA: Insufficient documentation

## 2023-10-23 DIAGNOSIS — N186 End stage renal disease: Secondary | ICD-10-CM | POA: Insufficient documentation

## 2023-10-23 DIAGNOSIS — D72829 Elevated white blood cell count, unspecified: Secondary | ICD-10-CM | POA: Diagnosis not present

## 2023-10-23 DIAGNOSIS — R0602 Shortness of breath: Secondary | ICD-10-CM | POA: Insufficient documentation

## 2023-10-23 DIAGNOSIS — E1022 Type 1 diabetes mellitus with diabetic chronic kidney disease: Secondary | ICD-10-CM | POA: Insufficient documentation

## 2023-10-23 DIAGNOSIS — Z794 Long term (current) use of insulin: Secondary | ICD-10-CM | POA: Insufficient documentation

## 2023-10-23 DIAGNOSIS — Z833 Family history of diabetes mellitus: Secondary | ICD-10-CM | POA: Insufficient documentation

## 2023-10-23 DIAGNOSIS — R109 Unspecified abdominal pain: Secondary | ICD-10-CM | POA: Diagnosis present

## 2023-10-23 DIAGNOSIS — R188 Other ascites: Secondary | ICD-10-CM | POA: Insufficient documentation

## 2023-10-23 DIAGNOSIS — D631 Anemia in chronic kidney disease: Secondary | ICD-10-CM | POA: Insufficient documentation

## 2023-10-23 DIAGNOSIS — I5020 Unspecified systolic (congestive) heart failure: Secondary | ICD-10-CM | POA: Diagnosis not present

## 2023-10-23 DIAGNOSIS — Z79899 Other long term (current) drug therapy: Secondary | ICD-10-CM | POA: Diagnosis not present

## 2023-10-23 DIAGNOSIS — I428 Other cardiomyopathies: Secondary | ICD-10-CM | POA: Insufficient documentation

## 2023-10-23 LAB — COMPREHENSIVE METABOLIC PANEL WITH GFR
ALT: 50 U/L — ABNORMAL HIGH (ref 0–44)
AST: 58 U/L — ABNORMAL HIGH (ref 15–41)
Albumin: 2.9 g/dL — ABNORMAL LOW (ref 3.5–5.0)
Alkaline Phosphatase: 637 U/L — ABNORMAL HIGH (ref 38–126)
Anion gap: 19 — ABNORMAL HIGH (ref 5–15)
BUN: 65 mg/dL — ABNORMAL HIGH (ref 6–20)
CO2: 20 mmol/L — ABNORMAL LOW (ref 22–32)
Calcium: 8.6 mg/dL — ABNORMAL LOW (ref 8.9–10.3)
Chloride: 97 mmol/L — ABNORMAL LOW (ref 98–111)
Creatinine, Ser: 8.96 mg/dL — ABNORMAL HIGH (ref 0.44–1.00)
GFR, Estimated: 6 mL/min — ABNORMAL LOW (ref >60)
Glucose, Bld: 322 mg/dL — ABNORMAL HIGH (ref 70–99)
Potassium: 5.2 mmol/L — ABNORMAL HIGH (ref 3.5–5.1)
Sodium: 136 mmol/L (ref 135–145)
Total Bilirubin: 1.1 mg/dL (ref 0.0–1.2)
Total Protein: 5.5 g/dL — ABNORMAL LOW (ref 6.5–8.1)

## 2023-10-23 LAB — BRAIN NATRIURETIC PEPTIDE: B Natriuretic Peptide: >4500 pg/mL — ABNORMAL HIGH (ref 0.0–100.0)

## 2023-10-23 LAB — I-STAT VENOUS BLOOD GAS, ED
Acid-base deficit: 3 mmol/L — ABNORMAL HIGH (ref 0.0–2.0)
Bicarbonate: 23.2 mmol/L (ref 20.0–28.0)
Calcium, Ion: 1.05 mmol/L — ABNORMAL LOW (ref 1.15–1.40)
HCT: 34 % — ABNORMAL LOW (ref 36.0–46.0)
Hemoglobin: 11.6 g/dL — ABNORMAL LOW (ref 12.0–15.0)
O2 Saturation: 99 %
Potassium: 5.1 mmol/L (ref 3.5–5.1)
Sodium: 135 mmol/L (ref 135–145)
TCO2: 25 mmol/L (ref 22–32)
pCO2, Ven: 45.6 mmHg (ref 44–60)
pH, Ven: 7.315 (ref 7.25–7.43)
pO2, Ven: 129 mmHg — ABNORMAL HIGH (ref 32–45)

## 2023-10-23 LAB — URINALYSIS, ROUTINE W REFLEX MICROSCOPIC
Bilirubin Urine: NEGATIVE
Glucose, UA: >=500 mg/dL — AB
Hgb urine dipstick: NEGATIVE
Ketones, ur: 5 mg/dL — AB
Leukocytes,Ua: NEGATIVE
Nitrite: NEGATIVE
Protein, ur: >=300 mg/dL — AB
Specific Gravity, Urine: 1.016 (ref 1.005–1.030)
pH: 8 (ref 5.0–8.0)

## 2023-10-23 LAB — CBC WITH DIFFERENTIAL/PLATELET
Abs Immature Granulocytes: 0.04 10*3/uL (ref 0.00–0.07)
Basophils Absolute: 0.1 10*3/uL (ref 0.0–0.1)
Basophils Relative: 1 %
Eosinophils Absolute: 0.6 10*3/uL — ABNORMAL HIGH (ref 0.0–0.5)
Eosinophils Relative: 6 %
HCT: 33.5 % — ABNORMAL LOW (ref 36.0–46.0)
Hemoglobin: 10.3 g/dL — ABNORMAL LOW (ref 12.0–15.0)
Immature Granulocytes: 0 %
Lymphocytes Relative: 31 %
Lymphs Abs: 3.5 10*3/uL (ref 0.7–4.0)
MCH: 27.5 pg (ref 26.0–34.0)
MCHC: 30.7 g/dL (ref 30.0–36.0)
MCV: 89.6 fL (ref 80.0–100.0)
Monocytes Absolute: 1.3 10*3/uL — ABNORMAL HIGH (ref 0.1–1.0)
Monocytes Relative: 11 %
Neutro Abs: 5.9 10*3/uL (ref 1.7–7.7)
Neutrophils Relative %: 51 %
Platelets: 239 10*3/uL (ref 150–400)
RBC: 3.74 MIL/uL — ABNORMAL LOW (ref 3.87–5.11)
RDW: 14.6 % (ref 11.5–15.5)
WBC: 11.3 10*3/uL — ABNORMAL HIGH (ref 4.0–10.5)
nRBC: 0 % (ref 0.0–0.2)

## 2023-10-23 LAB — PHOSPHORUS: Phosphorus: 6.5 mg/dL — ABNORMAL HIGH (ref 2.5–4.6)

## 2023-10-23 LAB — LIPASE, BLOOD: Lipase: 19 U/L (ref 11–51)

## 2023-10-23 LAB — BETA-HYDROXYBUTYRIC ACID: Beta-Hydroxybutyric Acid: 0.26 mmol/L (ref 0.05–0.27)

## 2023-10-23 LAB — CBG MONITORING, ED: Glucose-Capillary: 173 mg/dL — ABNORMAL HIGH (ref 70–99)

## 2023-10-23 LAB — HCG, SERUM, QUALITATIVE: Preg, Serum: NEGATIVE

## 2023-10-23 MED ORDER — SODIUM ZIRCONIUM CYCLOSILICATE 10 G PO PACK
20.0000 g | PACK | ORAL | Status: AC
  Administered 2023-10-23: 20 g via ORAL
  Filled 2023-10-23: qty 2

## 2023-10-23 MED ORDER — HYDROMORPHONE HCL 1 MG/ML IJ SOLN
0.5000 mg | Freq: Once | INTRAMUSCULAR | Status: AC
  Administered 2023-10-23: 1 mg via INTRAVENOUS

## 2023-10-23 MED ORDER — HYDROMORPHONE HCL 1 MG/ML IJ SOLN
INTRAMUSCULAR | Status: AC
  Filled 2023-10-23: qty 1

## 2023-10-23 MED ORDER — ANTICOAGULANT SODIUM CITRATE 4% (200MG/5ML) IV SOLN: 5.0000 mL | Status: DC | PRN

## 2023-10-23 MED ORDER — LIDOCAINE HCL (PF) 1 % IJ SOLN: 5.0000 mL | INTRAMUSCULAR | Status: DC | PRN

## 2023-10-23 MED ORDER — PENTAFLUOROPROP-TETRAFLUOROETH EX AERO: 1.0000 | INHALATION_SPRAY | CUTANEOUS | Status: DC | PRN

## 2023-10-23 MED ORDER — NEPRO/CARBSTEADY PO LIQD: 237.0000 mL | ORAL | Status: DC | PRN

## 2023-10-23 MED ORDER — HYDROMORPHONE HCL 1 MG/ML IJ SOLN
1.0000 mg | Freq: Once | INTRAMUSCULAR | Status: AC
  Administered 2023-10-23: 1 mg via INTRAVENOUS
  Filled 2023-10-23: qty 1

## 2023-10-23 MED ORDER — INSULIN ASPART 100 UNIT/ML IJ SOLN: 8.0000 [IU] | Freq: Once | INTRAMUSCULAR | Status: DC

## 2023-10-23 MED ORDER — CHLORHEXIDINE GLUCONATE CLOTH 2 % EX PADS: 6.0000 | MEDICATED_PAD | Freq: Every day | CUTANEOUS | Status: DC

## 2023-10-23 MED ORDER — TRIMETHOBENZAMIDE HCL 100 MG/ML IM SOLN
200.0000 mg | INTRAMUSCULAR | Status: AC
  Administered 2023-10-23: 200 mg via INTRAMUSCULAR
  Filled 2023-10-23: qty 2

## 2023-10-23 MED ORDER — ALTEPLASE 2 MG IJ SOLR: 2.0000 mg | Freq: Once | INTRAMUSCULAR | Status: DC | PRN

## 2023-10-23 MED ORDER — PROCHLORPERAZINE EDISYLATE 10 MG/2ML IJ SOLN
10.0000 mg | Freq: Once | INTRAMUSCULAR | Status: AC
  Administered 2023-10-23: 10 mg via INTRAVENOUS
  Filled 2023-10-23: qty 2

## 2023-10-23 MED ORDER — HEPARIN SODIUM (PORCINE) 1000 UNIT/ML DIALYSIS: 1000.0000 [IU] | INTRAMUSCULAR | Status: DC | PRN

## 2023-10-23 MED ORDER — LIDOCAINE-PRILOCAINE 2.5-2.5 % EX CREA: 1.0000 | TOPICAL_CREAM | CUTANEOUS | Status: DC | PRN

## 2023-10-23 NOTE — ED Provider Notes (Signed)
 [place] EMERGENCY DEPARTMENT AT [facility] Provider Note   CSN: [id number] Arrival date & time: 10/23/23  9787     Patient presents with: Abdominal Pain   Ashley Freeman is a 33 y.o. female with history of ESRD dialysis Tuesday Thursday Saturday, HFrEF, hypertension, nonischemic cardiomyopathy, diabetes type 1.  Patient presents to ED for evaluation of abdominal distention and shortness of breath.  Reports that she is supposed to go to dialysis on Tuesday Thursday Saturday.  Reports that the last time she had dialysis was on Saturday.  States she missed on Tuesday because she could not find a babysitter for her daughter.  She reports that ever since the last 2 days she has had increasing abdominal distention, pain, shortness of breath.  Denies chest pain.  Endorsing 2 episodes of nausea and vomiting.  Denies diarrhea.  Denies fevers at home.  Reports he still makes urine, denies dysuria.  Patient is concerned that she is in DKA she states she has had elevated glucose readings at home.  Denies leg swelling.    Abdominal Pain Associated symptoms: nausea, shortness of breath and vomiting   Associated symptoms: no chest pain and no diarrhea        Prior to Admission medications   Medication Sig Start Date End Date Taking? Authorizing Provider  albuterol  (VENTOLIN  HFA) 108 (90 Base) MCG/ACT inhaler Inhale 2 puffs into the lungs every 4 (four) hours as needed for wheezing or shortness of breath. 11/24/22  Yes [provider]  atorvastatin  (LIPITOR) 80 MG tablet Take 1 tablet (80 mg total) by mouth daily. 10/15/23  Yes Danford, Lonni SQUIBB, MD  bumetanide  (BUMEX ) 2 MG tablet Take 2 mg by mouth daily.   Yes [provider]  calcitRIOL  (ROCALTROL ) 0.25 MCG capsule Take 5 capsules (1.25 mcg total) by mouth every Tuesday, Thursday, and Saturday at 6 PM. 07/09/23  Yes Cindy Garnette POUR, MD  carvedilol  (COREG ) 25 MG tablet Take 25 mg by mouth 2 (two) times  daily with a meal. 05/15/23  Yes [provider]  Continuous Glucose Sensor (DEXCOM G7 SENSOR) MISC Change sensors every 10 days 09/07/22  Yes Shamleffer, Ibtehal Jaralla, MD  cyclobenzaprine  (FLEXERIL ) 5 MG tablet Take 1 tablet (5 mg total) by mouth 3 (three) times daily as needed for muscle spasms. 08/31/23  Yes Ghimire, Donalda HERO, MD  DULoxetine  (CYMBALTA ) 20 MG capsule Take 20 mg by mouth daily. 07/29/23  Yes [provider]  famotidine  (PEPCID ) 20 MG tablet Take 20 mg by mouth 2 (two) times daily. 07/29/23  Yes [provider]  insulin  aspart (NOVOLOG ) 100 UNIT/ML injection Inject 5 Units into the skin 3 (three) times daily as needed for high blood sugar.   Yes [provider]  lamoTRIgine  (LAMICTAL ) 200 MG tablet Take 200 mg by mouth daily. 07/11/23  Yes [provider]  LANTUS  SOLOSTAR 100 UNIT/ML Solostar Pen Inject 10 Units into the skin at bedtime.   Yes [provider]  LORazepam  (ATIVAN ) 1 MG tablet Take 1 mg by mouth daily as needed for anxiety or sleep. 07/11/23  Yes [provider]  losartan  (COZAAR ) 100 MG tablet Take 150 mg by mouth daily. 03/25/23  Yes [provider]  methylphenidate 18 MG PO CR tablet Take 18 mg by mouth every morning. 08/21/23  Yes [provider]  OLANZapine  zydis (ZYPREXA ) 5 MG disintegrating tablet Take 5 mg by mouth at bedtime. 09/23/23 12/22/23 Yes [provider]  ondansetron  (ZOFRAN -ODT) 4 MG disintegrating  tablet Take 1 tablet (4 mg total) by mouth every 8 (eight) hours as needed for nausea or vomiting. 06/23/23  Yes Davis, Jonathon H, MD  Pancrelipase , Lip-Prot-Amyl, 3000-9500 units CPEP Take 3,000 units of lipase by mouth 3 (three) times daily. 09/23/23 12/22/23 Yes [provider]  prochlorperazine  (COMPAZINE ) 5 MG tablet Take 5 mg by mouth every 6 (six) hours as needed for nausea. 09/23/23  Yes [provider]  sevelamer  carbonate (RENVELA ) 800 MG tablet Take  2,400 mg by mouth 3 (three) times daily with meals.   Yes [provider]  sodium bicarbonate  650 MG tablet Take 1,300 mg by mouth 2 (two) times daily. 07/29/23  Yes [provider]  hydrOXYzine  (ATARAX ) 25 MG tablet Take 25 mg by mouth every 6 (six) hours as needed for anxiety or itching. Patient not taking: Reported on 10/23/2023 05/14/23   [provider]  metoCLOPramide  (REGLAN ) 10 MG tablet Take 1 tablet (10 mg total) by mouth every 8 (eight) hours as needed for nausea. Patient not taking: Reported on 10/23/2023 10/14/23   Jonel Lonni SQUIBB, MD  SUMAtriptan  (IMITREX ) 50 MG tablet Take 50 mg by mouth every 2 (two) hours as needed for migraine or headache. Patient not taking: Reported on 10/23/2023 06/20/22   [provider]    Allergies: Cephalexin , Morphine , Penicillins, Fish allergy, Benadryl  [diphenhydramine ], Dilaudid  [hydromorphone ], Doxycycline , Methotrexate derivatives, and Oxycodone     Review of Systems  Respiratory:  Positive for shortness of breath.   Cardiovascular:  Negative for chest pain and leg swelling.  Gastrointestinal:  Positive for abdominal distention, abdominal pain, nausea and vomiting. Negative for diarrhea.  All other systems reviewed and are negative.   Updated Vital Signs BP (!) 165/106   Pulse (!) 104   Temp 99 F (37.2 C) (Oral)   Resp (!) 22   Ht 5' 3 (1.6 m)   Wt 61.2 kg   LMP  (LMP Unknown)   SpO2 98%   BMI 23.91 kg/m   Physical Exam Vitals and nursing note reviewed.  Constitutional:      General: She is not in acute distress.    Appearance: She is well-developed.  HENT:     Head: Normocephalic and atraumatic.   Eyes:     Conjunctiva/sclera: Conjunctivae normal.    Cardiovascular:     Rate and Rhythm: Normal rate and regular rhythm.     Heart sounds: No murmur heard. Pulmonary:     Effort: Pulmonary effort is normal. No respiratory distress.     Breath sounds: Normal breath sounds.  Abdominal:      General: There is distension.     Palpations: Abdomen is soft.     Tenderness: There is abdominal tenderness.   Musculoskeletal:        General: No swelling.     Cervical back: Neck supple.     Right lower leg: No edema.     Left lower leg: No edema.   Skin:    General: Skin is warm and dry.     Capillary Refill: Capillary refill takes less than 2 seconds.   Neurological:     Mental Status: She is alert and oriented to person, place, and time. Mental status is at baseline.   Psychiatric:        Mood and Affect: Mood normal.     (all labs ordered are listed, but only abnormal results are displayed) Labs Reviewed  CBC WITH DIFFERENTIAL/PLATELET - Abnormal; Notable for the following components:  Result Value   WBC 11.3 (*)    RBC 3.74 (*)    Hemoglobin 10.3 (*)    HCT 33.5 (*)    Monocytes Absolute 1.3 (*)    Eosinophils Absolute 0.6 (*)    All other components within normal limits  COMPREHENSIVE METABOLIC PANEL WITH GFR - Abnormal; Notable for the following components:   Potassium 5.2 (*)    Chloride 97 (*)    CO2 20 (*)    Glucose, Bld 322 (*)    BUN 65 (*)    Creatinine, Ser 8.96 (*)    Calcium  8.6 (*)    Total Protein 5.5 (*)    Albumin  2.9 (*)    AST 58 (*)    ALT 50 (*)    Alkaline Phosphatase 637 (*)    GFR, Estimated 6 (*)    Anion gap 19 (*)    All other components within normal limits  BRAIN NATRIURETIC PEPTIDE - Abnormal; Notable for the following components:   B Natriuretic Peptide >4,500.0 (*)    All other components within normal limits  I-STAT VENOUS BLOOD GAS, ED - Abnormal; Notable for the following components:   pO2, Ven 129 (*)    Acid-base deficit 3.0 (*)    Calcium , Ion 1.05 (*)    HCT 34.0 (*)    Hemoglobin 11.6 (*)    All other components within normal limits  CBG MONITORING, ED - Abnormal; Notable for the following components:   Glucose-Capillary 173 (*)    All other components within normal limits  LIPASE, BLOOD  HCG, SERUM,  QUALITATIVE  BETA-HYDROXYBUTYRIC ACID  URINALYSIS, ROUTINE W REFLEX MICROSCOPIC  HEPATITIS B SURFACE ANTIGEN  HEPATITIS B SURFACE ANTIBODY, QUANTITATIVE    EKG: EKG Interpretation Date/Time:  Wednesday October 23 2023 02:16:51 EDT Ventricular Rate:  99 PR Interval:  141 QRS Duration:  85 QT Interval:  374 QTC Calculation: 480 R Axis:   38  Text Interpretation: Sinus rhythm Borderline prolonged QT interval Nonspecific ST abnormality When compared with ECG of 10/20/2023, No significant change was found Confirmed by Raford Lenis ([id number]) on 10/23/2023 2:31:27 AM  Radiology: DG Chest 2 View Result Date: 10/23/2023 CLINICAL DATA:  Dyspnea, volume overload EXAM: CHEST - 2 VIEW COMPARISON:  10/18/2023 FINDINGS: Lung volumes are small, but are symmetric and are stable since prior examination. Progressive mild perihilar interstitial pulmonary infiltrate has developed in keeping with mild progressive interstitial pulmonary edema. No pneumothorax or pleural effusion. Cardiac size is mildly enlarged. No acute bone. IMPRESSION: 1. Pulmonary hypoinflation. 2. Mild progressive interstitial pulmonary edema, possibly cardiogenic. Electronically Signed   By: Dorethia Molt M.D.   On: 10/23/2023 02:41     Procedures   Medications Ordered in the ED  Chlorhexidine  Gluconate Cloth 2 % PADS 6 each (has no administration in time range)  Chlorhexidine  Gluconate Cloth 2 % PADS 6 each (has no administration in time range)  trimethobenzamide  (TIGAN ) injection 200 mg (200 mg Intramuscular Given by Other 10/23/23 0309)  HYDROmorphone  (DILAUDID ) injection 1 mg (1 mg Intravenous Given 10/23/23 0320)  sodium zirconium cyclosilicate  (LOKELMA ) packet 20 g (20 g Oral Given 10/23/23 0410)    Clinical Course as of 10/23/23 0546  Wed Oct 23, 2023  0447 [facility] [CG]  [phone] Patient will be dialyzed in AM and then return to ED for probable discharge. Discussed with Dr. Melia. [CG]    Clinical Course User Index [CG]  Ruthell Lonni FALCON, PA-C   Medical Decision Making Amount and/or Complexity of Data  Reviewed Labs: ordered. Radiology: ordered.  Risk Prescription drug management.   33 year old female presents for evaluation.  Please see HPI for further details.  33 year old with history of HFrEF, end-stage renal disease not under optimal control presents for abdominal distention, shortness of breath most likely secondary to volume overload in the setting of missed dialysis sessions.  Patient reports that she missed dialysis session on Tuesday due to lack of transportation.  Patient chart reviewed and the patient has longstanding history of missed dialysis sessions.  She reports that she has no options in regards to getting a ride to her dialysis session.  Reports that she receives dialysis in [place] but lives in [place] and relies on [place], public transportation to make her appointments.  Labwork obtained which shows a leukocytosis to 11.3 and baseline hemoglobin.  Her metabolic panel reveals potassium 5.2 treated with 20 g Lokelma , glucose 322, BUN 65, creatinine 8.96, alk phos 637, anion gap 19.  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  The patient was concerned about being in DKA however her beta-hydroxybutyrate acid is not elevated at 0.26.  Her i-STAT VBG is unremarkable.  Her lipase is 19.  Her BNP is chronically elevated greater than 4500 which is consistent with today's lab value.  On chart review, the patient does have a longstanding history of ascites.  Patient has had diagnostic paracentesis obtained in the past which shows no evidence of SBP.  The last sampling was done on 08/26/2023.  Patient ascites most likely secondary to portal hypertension/congestive hepatopathy.  Patient chest x-ray reveals pulmonary hypoinflation as well as mild progressive interstitial pulmonary edema.  On recheck of patient CBG, her glucose has decreased to 173.  She  reports that she did take insulin  prior to arrival in the ED.  No concern for DKA at this time.  I feel that the patient lab results and presentation are most likely secondary to missed dialysis session.  At this time I feel the patient requires dialysis.  She is unable to make outpatient appointment so having her dialyzed today at outpatient center most likely not able to be completed.  The patient was discussed with on-call nephrologist Dr. Melia who states that he will dialyze the patient today in the hospital.  Most likely patient will be discharged after dialysis.  Patient amenable to plan.   Final diagnoses:  Hypervolemia, unspecified hypervolemia type  Other ascites  ESRD (end stage renal disease) on dialysis [facility])    ED Discharge Orders     None          Candence Sease F, PA-C 10/23/23 0518    Raford Lenis, MD 10/23/23 [phone]

## 2023-10-23 NOTE — ED Notes (Signed)
 Transported to dialysis.

## 2023-10-23 NOTE — ED Provider Notes (Signed)
 Patient just returned from her dialysis session.  She reported feeling much better.  She is stable for discharge.  She will resume her dialysis schedule.   Nivia Colon, PA-C 10/23/23 1203    Ula Prentice SAUNDERS, MD 10/23/23 [phone]

## 2023-10-23 NOTE — Discharge Instructions (Signed)
 Please resume your dialysis schedule for better management of your condition.

## 2023-10-23 NOTE — ED Triage Notes (Signed)
 Pt presents from home by [employer] for shortness of breath. Is alert and oriented. Missed dialysis on Tuesday due to transportation issues. Has a distended abdomen that is tight to touch. Said that from time to time she has had a paracentesis done.  All of her symptoms tonight started 2 days ago and has become worse.

## 2023-10-23 NOTE — Progress Notes (Signed)
 D/C noted. Contacted [employer] [facility] and spoke to [employer]. Clinic staff advised pt had HD treatment here today and was d/c from ED to home. Clinic advised pt should resume care tomorrow (if pt goes to treatment).   Randine Mungo Renal Navigator [phone]

## 2023-10-23 NOTE — Consult Note (Signed)
 Renal Service Consult Note [facility]  Ashley Freeman 10/23/2023 Ashley JONETTA Fret, MD Requesting Physician: C. Groce, PA  Reason for Consult: ESRD pt w/ SOB and vol overload HPI: The patient is a 33 y.o. year-old w/ PMH as below who presented to ED early this morning complaining of shortness of breath.  Also distended abdomen and abdominal pain.  She has had paracentesis done in the past.  Symptoms started 2 days ago.  Blood pressure 150/90, HR 100, RR 27-32.  K+ 5.2, creatinine 8.9, albumin  2.9, BNP 4500.  Hgb 10.3.  Chest x-ray showed vascular congestion and probable early interstitial edema on the right side.  Renal service was asked to see patient for dialysis.  HD orders were written and patient was taken up to dialysis early this morning about 4:00 AM.  This is formal renal consult for ESRD issues.   Pt seen in HD unit.  Main complaints are as above with abdominal swelling and pain, shortness of breath mostly with moving about.  Also diffuse edema of the extremities.   ROS - denies CP, no joint pain, no HA, no blurry vision, no rash, no diarrhea, no nausea/ vomiting  PMH: Anemia Bipolar disorder Depression Diarrhea Diabetes type 1 ESRD on HD HFrEF HL HTN  Past Surgical History  Past Surgical History:  Procedure Laterality Date   A/V FISTULAGRAM Right 06/17/2023   Procedure: A/V Fistulagram;  Surgeon: Marea Selinda GORMAN, MD;  Location: [facility] INVASIVE CV LAB;  Service: Cardiovascular;  Laterality: Right;   A/V SHUNT INTERVENTION N/A 09/25/2023   Procedure: A/V SHUNT INTERVENTION;  Surgeon: Pearline Norman GORMAN, MD;  Location: [facility] PV LAB;  Service: Cardiovascular;  Laterality: N/A;   AV FISTULA PLACEMENT Right 07/06/2022   Procedure: ARTERIOVENOUS GRAFT CREATION;  Surgeon: Gretta Lonni PARAS, MD;  Location: [facility] OR;  Service: Vascular;  Laterality: Right;   CESAREAN SECTION N/A 10/05/2019   Procedure: CESAREAN SECTION;  Surgeon: Izell Harari, MD;   Location: [facility] LD ORS;  Service: Obstetrics;  Laterality: N/A;   CHOLECYSTECTOMY N/A 07/02/2023   Procedure: LAPAROSCOPIC CHOLECYSTECTOMY;  Surgeon: Ebbie Cough, MD;  Location: [facility] OR;  Service: General;  Laterality: N/A;   INCISION AND DRAINAGE ABSCESS Left 09/28/2019   Procedure: INCISION AND DRAINAGE VULVAR ABCESS;  Surgeon: Edsel Norleen GAILS, MD;  Location: [facility] OR;  Service: Gynecology;  Laterality: Left;   INCISION AND DRAINAGE PERIRECTAL ABSCESS Right 08/18/2013   Procedure: IRRIGATION AND DEBRIDEMENT GLUTEAL ABSCESS;  Surgeon: Lynda Leos, MD;  Location: [facility] OR;  Service: General;  Laterality: Right;   INCISION AND DRAINAGE PERIRECTAL ABSCESS Right 09/19/2013   Procedure: IRRIGATION AND DEBRIDEMENT RIGHT GLUTEAL AND LABIAL ABSCESSES;  Surgeon: Lynda Leos, MD;  Location: [facility] OR;  Service: General;  Laterality: Right;   INCISION AND DRAINAGE PERIRECTAL ABSCESS Right 09/24/2013   Procedure: IRRIGATION AND DEBRIDEMENT PERIRECTAL ABSCESS;  Surgeon: Lynwood MALVA Pina, MD;  Location: [facility] OR;  Service: General;  Laterality: Right;   IR PARACENTESIS  08/28/2023   Family History  Family History  Problem Relation Age of Onset   Asthma Mother    Carpal tunnel syndrome Mother    Gout Father    Diabetes Paternal Grandmother    Anesthesia problems Neg Hx    Social History  reports that she has never smoked. She has never been exposed to tobacco smoke. She has never used smokeless tobacco. She reports that she does not currently use alcohol. She reports that she does not use drugs. Allergies  Allergies  Allergen  Reactions   Cephalexin  Anaphylaxis    Has gotten ceftriaxone  in the past    Morphine  Itching   Penicillins Hives and Rash   Fish Allergy    Benadryl  [Diphenhydramine ] Itching   Dilaudid  [Hydromorphone ] Itching   Doxycycline  Itching   Methotrexate Derivatives Rash   Oxycodone  Itching   Home medications Prior to Admission medications   Medication Sig Start Date End Date Taking?  Authorizing Provider  albuterol  (VENTOLIN  HFA) 108 (90 Base) MCG/ACT inhaler Inhale 2 puffs into the lungs every 4 (four) hours as needed for wheezing or shortness of breath. 11/24/22  Yes [provider]  atorvastatin  (LIPITOR) 80 MG tablet Take 1 tablet (80 mg total) by mouth daily. 10/15/23  Yes Danford, Lonni SQUIBB, MD  bumetanide  (BUMEX ) 2 MG tablet Take 2 mg by mouth daily.   Yes [provider]  calcitRIOL  (ROCALTROL ) 0.25 MCG capsule Take 5 capsules (1.25 mcg total) by mouth every Tuesday, Thursday, and Saturday at 6 PM. 07/09/23  Yes Cindy Garnette POUR, MD  carvedilol  (COREG ) 25 MG tablet Take 25 mg by mouth 2 (two) times daily with a meal. 05/15/23  Yes [provider]  Continuous Glucose Sensor (DEXCOM G7 SENSOR) MISC Change sensors every 10 days 09/07/22  Yes Shamleffer, Ibtehal Jaralla, MD  cyclobenzaprine  (FLEXERIL ) 5 MG tablet Take 1 tablet (5 mg total) by mouth 3 (three) times daily as needed for muscle spasms. 08/31/23  Yes Ghimire, Donalda HERO, MD  DULoxetine  (CYMBALTA ) 20 MG capsule Take 20 mg by mouth daily. 07/29/23  Yes [provider]  famotidine  (PEPCID ) 20 MG tablet Take 20 mg by mouth 2 (two) times daily. 07/29/23  Yes [provider]  insulin  aspart (NOVOLOG ) 100 UNIT/ML injection Inject 5 Units into the skin 3 (three) times daily as needed for high blood sugar.   Yes [provider]  lamoTRIgine  (LAMICTAL ) 200 MG tablet Take 200 mg by mouth daily. 07/11/23  Yes [provider]  LANTUS  SOLOSTAR 100 UNIT/ML Solostar Pen Inject 10 Units into the skin at bedtime.   Yes [provider]  LORazepam  (ATIVAN ) 1 MG tablet Take 1 mg by mouth daily as needed for anxiety or sleep. 07/11/23  Yes [provider]  losartan  (COZAAR ) 100 MG tablet Take 150 mg by mouth daily. 03/25/23  Yes [provider]  methylphenidate 18 MG PO CR tablet Take 18 mg by mouth every morning. 08/21/23  Yes [provider]   OLANZapine  zydis (ZYPREXA ) 5 MG disintegrating tablet Take 5 mg by mouth at bedtime. 09/23/23 12/22/23 Yes [provider]  ondansetron  (ZOFRAN -ODT) 4 MG disintegrating tablet Take 1 tablet (4 mg total) by mouth every 8 (eight) hours as needed for nausea or vomiting. 06/23/23  Yes Davis, Jonathon H, MD  Pancrelipase , Lip-Prot-Amyl, 3000-9500 units CPEP Take 3,000 units of lipase by mouth 3 (three) times daily. 09/23/23 12/22/23 Yes [provider]  prochlorperazine  (COMPAZINE ) 5 MG tablet Take 5 mg by mouth every 6 (six) hours as needed for nausea. 09/23/23  Yes [provider]  sevelamer  carbonate (RENVELA ) 800 MG tablet Take 2,400 mg by mouth 3 (three) times daily with meals.   Yes [provider]  sodium bicarbonate  650 MG tablet Take 1,300 mg by mouth 2 (two) times daily. 07/29/23  Yes [provider]  hydrOXYzine  (ATARAX ) 25 MG tablet Take 25 mg by mouth every 6 (six) hours as needed for anxiety or itching. Patient not taking: Reported on 10/23/2023 05/14/23   [provider]  metoCLOPramide  (  REGLAN ) 10 MG tablet Take 1 tablet (10 mg total) by mouth every 8 (eight) hours as needed for nausea. Patient not taking: Reported on 10/23/2023 10/14/23   Jonel Lonni SQUIBB, MD  SUMAtriptan  (IMITREX ) 50 MG tablet Take 50 mg by mouth every 2 (two) hours as needed for migraine or headache. Patient not taking: Reported on 10/23/2023 06/20/22   [provider]     Vitals:   10/23/23 9261 10/23/23 0744 10/23/23 0800 10/23/23 0830  BP: 137/79 124/80 138/89 (!) 139/94  Pulse: (!) 101 96 (!) 104 (!) 104  Resp: (!) 27 (!) 31 (!) 28 (!) 36  Temp: 98.9 F (37.2 C)     TempSrc: Oral     SpO2: 94% 93% 97% 94%  Weight:      Height:       Exam Gen alert, no distress No rash, cyanosis or gangrene Sclera anicteric, throat clear  No jvd or bruits Chest clear bilat to bases, no rales/ wheezing RRR no MRG Abd soft ntnd no mass or ascites +bs MS no  joint effusions or deformity Ext diffuse 2+ PT and hip edema including bilat flank edema Neuro is alert, Ox 3 , nf    R AVF+bruit       Renal-related home meds: Bumex  2mg  every day Coreg  25 bid Losartan  150mg  every day Renvela  3 ac tid Sod bicarb 1300 bid     OP HD: TTS [facility] [address] From April 2025 --> 3h   B350  57.5kg  AVG  Heparin  1600+ 600 /hr   Assessment/ Plan: Shortness of breath: X-ray shows early pulmonary edema, patient is not in distress but obviously diffusely volume overloaded with lower extremity edema, flank edema and ascites.  Plan HD today. ESRD: on HD TTS. HD today as above.  HTN: BP's are wnl, cont home meds Volume overload: as above Anemia of esrd: Hb 10-11, follow Secondary hyperparathyroidism: CCa in range, add on phos   Rob Stephens Shreve  MD [facility] 10/23/2023, 8:45 AM  Recent Labs  Lab 10/20/23 2240 10/23/23 0310 10/23/23 0356  HGB 10.7* 10.3* 11.6*  ALBUMIN  2.6* 2.9*  --   CALCIUM  8.6* 8.6*  --   CREATININE 6.70* 8.96*  --   K 4.3 5.2* 5.1   Inpatient medications:  Chlorhexidine  Gluconate Cloth  6 each Topical Q0600   Chlorhexidine  Gluconate Cloth  6 each Topical Q0600   prochlorperazine   10 mg Intravenous Once in dialysis    anticoagulant sodium citrate      alteplase , anticoagulant sodium citrate , feeding supplement (NEPRO CARB STEADY), heparin , lidocaine  (PF), lidocaine -prilocaine , pentafluoroprop-tetrafluoroeth

## 2023-10-23 NOTE — Progress Notes (Signed)
 Received patient in bed to unit.  Alert and oriented.  Informed consent signed and in chart.   TX duration:3.5 hours  Patient tolerated well.  Transported back to the room  Alert, without acute distress.  Hand-off given to patient's nurse.   Access used: Right Upper Arm fistula Access issues: none  Total UF removed: 3L Medication(s) given: dilaudid , compazine    10/23/23 1123  Vitals  Temp 98 F (36.7 C)  BP (!) 151/107  Pulse Rate 100  Weight  (ed bed unable to stand---)  Oxygen Therapy  SpO2 93 %  O2 Device Room Air  Patient Activity (if Appropriate) In bed  Pulse Oximetry Type Continuous  Oximetry Probe Site Changed No  During Treatment Monitoring  HD Safety Checks Performed Yes  Intra-Hemodialysis Comments Tx completed  Post Treatment  Dialyzer Clearance Lightly streaked  Liters Processed 73.6  Fluid Removed (mL) 3000 mL  Tolerated HD Treatment Yes  AVG/AVF Arterial Site Held (minutes) 7 minutes  AVG/AVF Venous Site Held (minutes) 7 minutes     Camellia Brasil LPN [facility]

## 2023-10-23 NOTE — ED Notes (Signed)
 Iv team at bedside

## 2023-10-24 ENCOUNTER — Ambulatory Visit: Admitting: Student in an Organized Health Care Education/Training Program

## 2023-10-25 ENCOUNTER — Ambulatory Visit: Admitting: Podiatry

## 2023-10-25 ENCOUNTER — Encounter (HOSPITAL_COMMUNITY): Payer: Self-pay

## 2023-10-25 ENCOUNTER — Other Ambulatory Visit: Payer: Self-pay

## 2023-10-25 ENCOUNTER — Emergency Department (HOSPITAL_COMMUNITY)

## 2023-10-25 ENCOUNTER — Inpatient Hospital Stay (HOSPITAL_COMMUNITY)
Admission: EM | Admit: 2023-10-25 | Discharge: 2023-10-29 | DRG: 637 | Disposition: A | Discharge status: Home / Self Care | Attending: Internal Medicine | Admitting: Internal Medicine

## 2023-10-25 DIAGNOSIS — Z833 Family history of diabetes mellitus: Secondary | ICD-10-CM

## 2023-10-25 DIAGNOSIS — Z885 Allergy status to narcotic agent status: Secondary | ICD-10-CM | POA: Diagnosis not present

## 2023-10-25 DIAGNOSIS — Z91013 Allergy to seafood: Secondary | ICD-10-CM

## 2023-10-25 DIAGNOSIS — E1022 Type 1 diabetes mellitus with diabetic chronic kidney disease: Secondary | ICD-10-CM | POA: Diagnosis present

## 2023-10-25 DIAGNOSIS — Z888 Allergy status to other drugs, medicaments and biological substances status: Secondary | ICD-10-CM

## 2023-10-25 DIAGNOSIS — I5023 Acute on chronic systolic (congestive) heart failure: Secondary | ICD-10-CM | POA: Diagnosis present

## 2023-10-25 DIAGNOSIS — Z881 Allergy status to other antibiotic agents status: Secondary | ICD-10-CM | POA: Diagnosis not present

## 2023-10-25 DIAGNOSIS — Z794 Long term (current) use of insulin: Secondary | ICD-10-CM | POA: Diagnosis not present

## 2023-10-25 DIAGNOSIS — I132 Hypertensive heart and chronic kidney disease with heart failure and with stage 5 chronic kidney disease, or end stage renal disease: Secondary | ICD-10-CM | POA: Diagnosis present

## 2023-10-25 DIAGNOSIS — Z539 Procedure and treatment not carried out, unspecified reason: Secondary | ICD-10-CM | POA: Diagnosis not present

## 2023-10-25 DIAGNOSIS — I428 Other cardiomyopathies: Secondary | ICD-10-CM | POA: Diagnosis present

## 2023-10-25 DIAGNOSIS — Z79899 Other long term (current) drug therapy: Secondary | ICD-10-CM | POA: Diagnosis not present

## 2023-10-25 DIAGNOSIS — Z87892 Personal history of anaphylaxis: Secondary | ICD-10-CM

## 2023-10-25 DIAGNOSIS — R140 Abdominal distension (gaseous): Secondary | ICD-10-CM | POA: Diagnosis present

## 2023-10-25 DIAGNOSIS — E10649 Type 1 diabetes mellitus with hypoglycemia without coma: Secondary | ICD-10-CM | POA: Diagnosis not present

## 2023-10-25 DIAGNOSIS — Z825 Family history of asthma and other chronic lower respiratory diseases: Secondary | ICD-10-CM

## 2023-10-25 DIAGNOSIS — E1010 Type 1 diabetes mellitus with ketoacidosis without coma: Secondary | ICD-10-CM | POA: Diagnosis not present

## 2023-10-25 DIAGNOSIS — D631 Anemia in chronic kidney disease: Secondary | ICD-10-CM | POA: Diagnosis present

## 2023-10-25 DIAGNOSIS — G9341 Metabolic encephalopathy: Secondary | ICD-10-CM | POA: Diagnosis present

## 2023-10-25 DIAGNOSIS — T383X6A Underdosing of insulin and oral hypoglycemic [antidiabetic] drugs, initial encounter: Secondary | ICD-10-CM | POA: Diagnosis present

## 2023-10-25 DIAGNOSIS — N186 End stage renal disease: Secondary | ICD-10-CM | POA: Diagnosis present

## 2023-10-25 DIAGNOSIS — E785 Hyperlipidemia, unspecified: Secondary | ICD-10-CM | POA: Diagnosis present

## 2023-10-25 DIAGNOSIS — E1110 Type 2 diabetes mellitus with ketoacidosis without coma: Secondary | ICD-10-CM | POA: Diagnosis present

## 2023-10-25 DIAGNOSIS — F319 Bipolar disorder, unspecified: Secondary | ICD-10-CM | POA: Diagnosis present

## 2023-10-25 DIAGNOSIS — M549 Dorsalgia, unspecified: Secondary | ICD-10-CM | POA: Diagnosis present

## 2023-10-25 DIAGNOSIS — Z91158 Patient's noncompliance with renal dialysis for other reason: Secondary | ICD-10-CM

## 2023-10-25 DIAGNOSIS — Z992 Dependence on renal dialysis: Secondary | ICD-10-CM

## 2023-10-25 DIAGNOSIS — Z880 Allergy status to penicillin: Secondary | ICD-10-CM

## 2023-10-25 DIAGNOSIS — Z91128 Patient's intentional underdosing of medication regimen for other reason: Secondary | ICD-10-CM

## 2023-10-25 LAB — I-STAT VENOUS BLOOD GAS, ED
Acid-base deficit: 9 mmol/L — ABNORMAL HIGH (ref 0.0–2.0)
Bicarbonate: 15.4 mmol/L — ABNORMAL LOW (ref 20.0–28.0)
Calcium, Ion: 1 mmol/L — ABNORMAL LOW (ref 1.15–1.40)
HCT: 35 % — ABNORMAL LOW (ref 36.0–46.0)
Hemoglobin: 11.9 g/dL — ABNORMAL LOW (ref 12.0–15.0)
O2 Saturation: 87 %
Potassium: 5.4 mmol/L — ABNORMAL HIGH (ref 3.5–5.1)
Sodium: 123 mmol/L — ABNORMAL LOW (ref 135–145)
TCO2: 16 mmol/L — ABNORMAL LOW (ref 22–32)
pCO2, Ven: 29.7 mmHg — ABNORMAL LOW (ref 44–60)
pH, Ven: 7.323 (ref 7.25–7.43)
pO2, Ven: 57 mmHg — ABNORMAL HIGH (ref 32–45)

## 2023-10-25 LAB — CBC WITH DIFFERENTIAL/PLATELET
Abs Immature Granulocytes: 0.06 10*3/uL (ref 0.00–0.07)
Basophils Absolute: 0 10*3/uL (ref 0.0–0.1)
Basophils Relative: 0 %
Eosinophils Absolute: 0.1 10*3/uL (ref 0.0–0.5)
Eosinophils Relative: 1 %
HCT: 32.7 % — ABNORMAL LOW (ref 36.0–46.0)
Hemoglobin: 10 g/dL — ABNORMAL LOW (ref 12.0–15.0)
Immature Granulocytes: 1 %
Lymphocytes Relative: 18 %
Lymphs Abs: 1.5 10*3/uL (ref 0.7–4.0)
MCH: 27.2 pg (ref 26.0–34.0)
MCHC: 30.6 g/dL (ref 30.0–36.0)
MCV: 89.1 fL (ref 80.0–100.0)
Monocytes Absolute: 1 10*3/uL (ref 0.1–1.0)
Monocytes Relative: 12 %
Neutro Abs: 5.7 10*3/uL (ref 1.7–7.7)
Neutrophils Relative %: 68 %
Platelets: 297 10*3/uL (ref 150–400)
RBC: 3.67 MIL/uL — ABNORMAL LOW (ref 3.87–5.11)
RDW: 15.3 % (ref 11.5–15.5)
WBC: 8.3 10*3/uL (ref 4.0–10.5)
nRBC: 0 % (ref 0.0–0.2)

## 2023-10-25 LAB — GLUCOSE, CAPILLARY
Glucose-Capillary: 472 mg/dL — ABNORMAL HIGH (ref 70–99)
Glucose-Capillary: 413 mg/dL — ABNORMAL HIGH (ref 70–99)
Glucose-Capillary: 478 mg/dL — ABNORMAL HIGH (ref 70–99)
Glucose-Capillary: 359 mg/dL — ABNORMAL HIGH (ref 70–99)
Glucose-Capillary: 290 mg/dL — ABNORMAL HIGH (ref 70–99)
Glucose-Capillary: 181 mg/dL — ABNORMAL HIGH (ref 70–99)
Glucose-Capillary: 114 mg/dL — ABNORMAL HIGH (ref 70–99)
Glucose-Capillary: 168 mg/dL — ABNORMAL HIGH (ref 70–99)
Glucose-Capillary: 66 mg/dL — ABNORMAL LOW (ref 70–99)

## 2023-10-25 LAB — BETA-HYDROXYBUTYRIC ACID: Beta-Hydroxybutyric Acid: 5.64 mmol/L — ABNORMAL HIGH (ref 0.05–0.27)

## 2023-10-25 LAB — COMPREHENSIVE METABOLIC PANEL WITH GFR
ALT: 33 U/L (ref 0–44)
AST: 23 U/L (ref 15–41)
Albumin: 2.9 g/dL — ABNORMAL LOW (ref 3.5–5.0)
Alkaline Phosphatase: 495 U/L — ABNORMAL HIGH (ref 38–126)
Anion gap: 24 — ABNORMAL HIGH (ref 5–15)
BUN: 55 mg/dL — ABNORMAL HIGH (ref 6–20)
CO2: 15 mmol/L — ABNORMAL LOW (ref 22–32)
Calcium: 8.8 mg/dL — ABNORMAL LOW (ref 8.9–10.3)
Chloride: 87 mmol/L — ABNORMAL LOW (ref 98–111)
Creatinine, Ser: 7.64 mg/dL — ABNORMAL HIGH (ref 0.44–1.00)
GFR, Estimated: 7 mL/min — ABNORMAL LOW (ref >60)
Glucose, Bld: 821 mg/dL (ref 70–99)
Potassium: 5.6 mmol/L — ABNORMAL HIGH (ref 3.5–5.1)
Sodium: 126 mmol/L — ABNORMAL LOW (ref 135–145)
Total Bilirubin: 2.4 mg/dL — ABNORMAL HIGH (ref 0.0–1.2)
Total Protein: 5.5 g/dL — ABNORMAL LOW (ref 6.5–8.1)

## 2023-10-25 LAB — MRSA NEXT GEN BY PCR, NASAL: MRSA by PCR Next Gen: NOT DETECTED

## 2023-10-25 LAB — CREATININE, SERUM
Creatinine, Ser: 7.71 mg/dL — ABNORMAL HIGH (ref 0.44–1.00)
GFR, Estimated: 7 mL/min — ABNORMAL LOW (ref >60)

## 2023-10-25 LAB — CBG MONITORING, ED
Glucose-Capillary: >600 mg/dL (ref 70–99)
Glucose-Capillary: >600 mg/dL (ref 70–99)
Glucose-Capillary: >600 mg/dL (ref 70–99)
Glucose-Capillary: >600 mg/dL (ref 70–99)
Glucose-Capillary: >600 mg/dL (ref 70–99)
Glucose-Capillary: >600 mg/dL (ref 70–99)
Glucose-Capillary: 518 mg/dL (ref 70–99)
Glucose-Capillary: 527 mg/dL (ref 70–99)
Glucose-Capillary: 539 mg/dL (ref 70–99)

## 2023-10-25 LAB — TROPONIN I (HIGH SENSITIVITY)
Troponin I (High Sensitivity): 16 ng/L (ref <18)
Troponin I (High Sensitivity): 13 ng/L (ref <18)

## 2023-10-25 LAB — BRAIN NATRIURETIC PEPTIDE: B Natriuretic Peptide: >4500 pg/mL — ABNORMAL HIGH (ref 0.0–100.0)

## 2023-10-25 LAB — HIV ANTIBODY (ROUTINE TESTING W REFLEX): HIV Screen 4th Generation wRfx: NONREACTIVE

## 2023-10-25 LAB — HCG, SERUM, QUALITATIVE: Preg, Serum: NEGATIVE

## 2023-10-25 MED ORDER — FENTANYL CITRATE PF 50 MCG/ML IJ SOSY
50.0000 ug | PREFILLED_SYRINGE | Freq: Once | INTRAMUSCULAR | Status: AC
  Administered 2023-10-25: 50 ug via INTRAVENOUS
  Filled 2023-10-25: qty 1

## 2023-10-25 MED ORDER — LAMOTRIGINE 100 MG PO TABS
200.0000 mg | ORAL_TABLET | Freq: Every day | ORAL | Status: DC
  Administered 2023-10-25 – 2023-10-29 (×5): 200 mg via ORAL
  Filled 2023-10-25 (×2): qty 2
  Filled 2023-10-25: qty 8
  Filled 2023-10-25 (×2): qty 2
  Filled 2023-10-25: qty 8

## 2023-10-25 MED ORDER — CARVEDILOL 12.5 MG PO TABS
25.0000 mg | ORAL_TABLET | Freq: Two times a day (BID) | ORAL | Status: DC
  Administered 2023-10-25 – 2023-10-27 (×3): 25 mg via ORAL
  Filled 2023-10-25 (×3): qty 2

## 2023-10-25 MED ORDER — METOCLOPRAMIDE HCL 5 MG/ML IJ SOLN
10.0000 mg | Freq: Once | INTRAMUSCULAR | Status: AC
  Administered 2023-10-25: 10 mg via INTRAVENOUS
  Filled 2023-10-25: qty 2

## 2023-10-25 MED ORDER — ATORVASTATIN CALCIUM 80 MG PO TABS
80.0000 mg | ORAL_TABLET | Freq: Every day | ORAL | Status: DC
  Administered 2023-10-25 – 2023-10-29 (×5): 80 mg via ORAL
  Filled 2023-10-25: qty 2
  Filled 2023-10-25 (×2): qty 1
  Filled 2023-10-25: qty 2
  Filled 2023-10-25: qty 1

## 2023-10-25 MED ORDER — CALCITRIOL 0.5 MCG PO CAPS
1.2500 ug | ORAL_CAPSULE | ORAL | Status: DC
  Administered 2023-10-26: 1.25 ug via ORAL
  Filled 2023-10-25: qty 1

## 2023-10-25 MED ORDER — PANTOPRAZOLE SODIUM 40 MG IV SOLR
40.0000 mg | Freq: Once | INTRAVENOUS | Status: AC
  Administered 2023-10-25: 40 mg via INTRAVENOUS
  Filled 2023-10-25: qty 10

## 2023-10-25 MED ORDER — INSULIN REGULAR(HUMAN) IN NACL 100-0.9 UT/100ML-% IV SOLN: INTRAVENOUS | Status: DC

## 2023-10-25 MED ORDER — LACTATED RINGERS IV BOLUS: 20.0000 mL/kg | Freq: Once | INTRAVENOUS | Status: DC

## 2023-10-25 MED ORDER — SODIUM BICARBONATE 650 MG PO TABS
1300.0000 mg | ORAL_TABLET | Freq: Two times a day (BID) | ORAL | Status: DC
  Administered 2023-10-25 – 2023-10-29 (×8): 1300 mg via ORAL
  Filled 2023-10-25 (×8): qty 2

## 2023-10-25 MED ORDER — INSULIN REGULAR(HUMAN) IN NACL 100-0.9 UT/100ML-% IV SOLN
INTRAVENOUS | Status: DC
  Administered 2023-10-25: 5.5 [IU]/h via INTRAVENOUS
  Filled 2023-10-25 (×3): qty 100

## 2023-10-25 MED ORDER — FAMOTIDINE 20 MG PO TABS
20.0000 mg | ORAL_TABLET | Freq: Two times a day (BID) | ORAL | Status: DC
  Administered 2023-10-25 – 2023-10-26 (×2): 20 mg via ORAL
  Filled 2023-10-25 (×2): qty 1

## 2023-10-25 MED ORDER — LACTATED RINGERS IV BOLUS
500.0000 mL | Freq: Once | INTRAVENOUS | Status: AC
  Administered 2023-10-25: 500 mL via INTRAVENOUS

## 2023-10-25 MED ORDER — DEXTROSE 50 % IV SOLN
0.0000 mL | INTRAVENOUS | Status: DC | PRN
  Administered 2023-10-25: 45 mL via INTRAVENOUS
  Administered 2023-10-26: 50 mL via INTRAVENOUS
  Filled 2023-10-25: qty 50

## 2023-10-25 MED ORDER — SEVELAMER CARBONATE 800 MG PO TABS
2400.0000 mg | ORAL_TABLET | Freq: Three times a day (TID) | ORAL | Status: DC
  Administered 2023-10-26 – 2023-10-29 (×7): 2400 mg via ORAL
  Filled 2023-10-25 (×10): qty 3

## 2023-10-25 MED ORDER — BUMETANIDE 2 MG PO TABS
2.0000 mg | ORAL_TABLET | Freq: Every day | ORAL | Status: DC
  Administered 2023-10-25 – 2023-10-29 (×5): 2 mg via ORAL
  Filled 2023-10-25 (×5): qty 1

## 2023-10-25 MED ORDER — LOSARTAN POTASSIUM 50 MG PO TABS
150.0000 mg | ORAL_TABLET | Freq: Every day | ORAL | Status: DC
  Administered 2023-10-25 – 2023-10-26 (×2): 150 mg via ORAL
  Filled 2023-10-25 (×2): qty 3

## 2023-10-25 MED ORDER — POLYETHYLENE GLYCOL 3350 17 G PO PACK: 17.0000 g | PACK | Freq: Every day | ORAL | Status: DC | PRN

## 2023-10-25 MED ORDER — CHLORHEXIDINE GLUCONATE CLOTH 2 % EX PADS
6.0000 | MEDICATED_PAD | Freq: Every day | CUTANEOUS | Status: DC
Start: 2023-10-26 — End: 2023-10-29
  Administered 2023-10-26 – 2023-10-29 (×4): 6 via TOPICAL

## 2023-10-25 MED ORDER — METHYLPHENIDATE HCL ER (OSM) 18 MG PO TBCR
18.0000 mg | EXTENDED_RELEASE_TABLET | Freq: Every morning | ORAL | Status: DC
  Administered 2023-10-26 – 2023-10-29 (×4): 18 mg via ORAL
  Filled 2023-10-25 (×4): qty 1

## 2023-10-25 MED ORDER — ALBUTEROL SULFATE (2.5 MG/3ML) 0.083% IN NEBU: 3.0000 mL | INHALATION_SOLUTION | RESPIRATORY_TRACT | Status: DC | PRN

## 2023-10-25 MED ORDER — OLANZAPINE 5 MG PO TBDP
5.0000 mg | ORAL_TABLET | Freq: Every day | ORAL | Status: DC
  Administered 2023-10-25 – 2023-10-28 (×4): 5 mg via ORAL
  Filled 2023-10-25 (×4): qty 1

## 2023-10-25 MED ORDER — INSULIN GLARGINE-YFGN 100 UNIT/ML ~~LOC~~ SOLN: 10.0000 [IU] | Freq: Every day | SUBCUTANEOUS | Status: DC

## 2023-10-25 MED ORDER — HEPARIN SODIUM (PORCINE) 5000 UNIT/ML IJ SOLN
5000.0000 [IU] | Freq: Three times a day (TID) | INTRAMUSCULAR | Status: DC
  Administered 2023-10-25 – 2023-10-29 (×10): 5000 [IU] via SUBCUTANEOUS
  Filled 2023-10-25 (×12): qty 1

## 2023-10-25 MED ORDER — ORAL CARE MOUTH RINSE: 15.0000 mL | OROMUCOSAL | Status: DC | PRN

## 2023-10-25 MED ORDER — HYDROMORPHONE HCL 1 MG/ML IJ SOLN
0.5000 mg | INTRAMUSCULAR | Status: AC | PRN
  Administered 2023-10-25 – 2023-10-26 (×2): 0.5 mg via INTRAVENOUS
  Filled 2023-10-25 (×2): qty 0.5

## 2023-10-25 MED ORDER — KETOROLAC TROMETHAMINE 15 MG/ML IJ SOLN
15.0000 mg | Freq: Once | INTRAMUSCULAR | Status: AC
  Administered 2023-10-25: 15 mg via INTRAVENOUS
  Filled 2023-10-25: qty 1

## 2023-10-25 MED ORDER — DULOXETINE HCL 20 MG PO CPEP
20.0000 mg | ORAL_CAPSULE | Freq: Every day | ORAL | Status: DC
  Administered 2023-10-25 – 2023-10-29 (×5): 20 mg via ORAL
  Filled 2023-10-25 (×5): qty 1

## 2023-10-25 MED ORDER — ENOXAPARIN SODIUM 30 MG/0.3ML IJ SOSY: 30.0000 mg | PREFILLED_SYRINGE | INTRAMUSCULAR | Status: DC

## 2023-10-25 MED ORDER — PANCRELIPASE (LIP-PROT-AMYL) 3000-9500 UNITS PO CPEP: ORAL_CAPSULE | Freq: Three times a day (TID) | ORAL | Status: DC

## 2023-10-25 NOTE — Consult Note (Signed)
 Renal Service Consult Note [facility]  Ashley Freeman 10/25/2023 Ashley JONETTA Fret, MD Requesting Physician: Dr. MICAEL Ada  Reason for Consult: ESRD patient with DKA HPI: The patient is a 33 y.o. year-old w/ PMH as below who presented to ED early this morning complaining of chest pain, generalized body pain and high blood sugars.  Usual dialysis is TTS, she missed her dialysis yesterday.  She was admitted here earlier this week and had dialysis here on 10/23/2023.  Patient was admitted.  We are asked to see for dialysis.   Pt seen in the ED room.  She is alert and responsive, wanting pain medicine.  No respiratory distress.  Poor historian at the moment, she is very uncomfortable   ROS - denies CP, no rash, no diarrhea, no nausea/ vomiting   Past Medical History  Past Medical History:  Diagnosis Date   Abscess, gluteal, right 08/24/2013   Anemia 02/19/2012   Bartholin's gland abscess 09/19/2013   Bipolar disorder (HCC)    BV (bacterial vaginosis) 11/24/2015   Depression    Diabetes mellitus type I (HCC) 2001   Diagnosed at age 4 ; Type I   Diarrhea 05/30/2016   DKA (diabetic ketoacidoses) 08/19/2013   Also in 2018   ESRD (end stage renal disease) (HCC)    Gonorrhea 08/2011   Treated in 09/2011   HFrEF (heart failure with reduced ejection fraction) (HCC)    a. 2022 Echo: EF 40%; b. 10/2021 Echo: EF 55%; b. 07/2022 MV: No ischemia. EF 31%; c. 08/2022 Echo: EF 35%, mildly dil RV, sev TR.   History of trichomoniasis 05/31/2016   Hyperlipidemia 03/28/2016   Hypertension    NICM (nonischemic cardiomyopathy) (HCC)    Sepsis (HCC) 09/19/2013   Past Surgical History  Past Surgical History:  Procedure Laterality Date   A/V FISTULAGRAM Right 06/17/2023   Procedure: A/V Fistulagram;  Surgeon: Marea Selinda GORMAN, MD;  Location: [facility] INVASIVE CV LAB;  Service: Cardiovascular;  Laterality: Right;   A/V SHUNT INTERVENTION N/A 09/25/2023   Procedure: A/V SHUNT  INTERVENTION;  Surgeon: Pearline Norman GORMAN, MD;  Location: [facility] PV LAB;  Service: Cardiovascular;  Laterality: N/A;   AV FISTULA PLACEMENT Right 07/06/2022   Procedure: ARTERIOVENOUS GRAFT CREATION;  Surgeon: Gretta Lonni PARAS, MD;  Location: [facility] OR;  Service: Vascular;  Laterality: Right;   CESAREAN SECTION N/A 10/05/2019   Procedure: CESAREAN SECTION;  Surgeon: Izell Harari, MD;  Location: [facility] LD ORS;  Service: Obstetrics;  Laterality: N/A;   CHOLECYSTECTOMY N/A 07/02/2023   Procedure: LAPAROSCOPIC CHOLECYSTECTOMY;  Surgeon: Ebbie Cough, MD;  Location: [facility] OR;  Service: General;  Laterality: N/A;   INCISION AND DRAINAGE ABSCESS Left 09/28/2019   Procedure: INCISION AND DRAINAGE VULVAR ABCESS;  Surgeon: Edsel Norleen GAILS, MD;  Location: [facility] OR;  Service: Gynecology;  Laterality: Left;   INCISION AND DRAINAGE PERIRECTAL ABSCESS Right 08/18/2013   Procedure: IRRIGATION AND DEBRIDEMENT GLUTEAL ABSCESS;  Surgeon: Lynda Leos, MD;  Location: [facility] OR;  Service: General;  Laterality: Right;   INCISION AND DRAINAGE PERIRECTAL ABSCESS Right 09/19/2013   Procedure: IRRIGATION AND DEBRIDEMENT RIGHT GLUTEAL AND LABIAL ABSCESSES;  Surgeon: Lynda Leos, MD;  Location: [facility] OR;  Service: General;  Laterality: Right;   INCISION AND DRAINAGE PERIRECTAL ABSCESS Right 09/24/2013   Procedure: IRRIGATION AND DEBRIDEMENT PERIRECTAL ABSCESS;  Surgeon: Lynwood MALVA Pina, MD;  Location: [facility] OR;  Service: General;  Laterality: Right;   IR PARACENTESIS  08/28/2023   Family History  Family History  Problem Relation Age of Onset   Asthma Mother    Carpal tunnel syndrome Mother    Gout Father    Diabetes Paternal Grandmother    Anesthesia problems Neg Hx    Social History  reports that she has never smoked. She has never been exposed to tobacco smoke. She has never used smokeless tobacco. She reports that she does not currently use alcohol. She reports that she does not use drugs. Allergies  Allergies  Allergen  Reactions   Cephalexin  Anaphylaxis    Has gotten ceftriaxone  in the past    Morphine  Itching   Penicillins Hives and Rash   Fish Allergy    Benadryl  [Diphenhydramine ] Itching   Dilaudid  [Hydromorphone ] Itching   Doxycycline  Itching   Methotrexate Derivatives Rash   Oxycodone  Itching   Home medications Prior to Admission medications   Medication Sig Start Date End Date Taking? Authorizing Provider  albuterol  (VENTOLIN  HFA) 108 (90 Base) MCG/ACT inhaler Inhale 2 puffs into the lungs every 4 (four) hours as needed for wheezing or shortness of breath. 11/24/22   [provider]  atorvastatin  (LIPITOR) 80 MG tablet Take 1 tablet (80 mg total) by mouth daily. 10/15/23   Danford, Lonni SQUIBB, MD  bumetanide  (BUMEX ) 2 MG tablet Take 2 mg by mouth daily.    [provider]  calcitRIOL  (ROCALTROL ) 0.25 MCG capsule Take 5 capsules (1.25 mcg total) by mouth every Tuesday, Thursday, and Saturday at 6 PM. 07/09/23   Cindy Garnette POUR, MD  carvedilol  (COREG ) 25 MG tablet Take 25 mg by mouth 2 (two) times daily with a meal. 05/15/23   [provider]  Continuous Glucose Sensor (DEXCOM G7 SENSOR) MISC Change sensors every 10 days 09/07/22   Shamleffer, Ibtehal Jaralla, MD  cyclobenzaprine  (FLEXERIL ) 5 MG tablet Take 1 tablet (5 mg total) by mouth 3 (three) times daily as needed for muscle spasms. 08/31/23   Ghimire, Donalda HERO, MD  DULoxetine  (CYMBALTA ) 20 MG capsule Take 20 mg by mouth daily. 07/29/23   [provider]  famotidine  (PEPCID ) 20 MG tablet Take 20 mg by mouth 2 (two) times daily. 07/29/23   [provider]  insulin  aspart (NOVOLOG ) 100 UNIT/ML injection Inject 5 Units into the skin 3 (three) times daily as needed for high blood sugar.    [provider]  lamoTRIgine  (LAMICTAL ) 200 MG tablet Take 200 mg by mouth daily. 07/11/23   [provider]  LANTUS  SOLOSTAR 100 UNIT/ML Solostar Pen Inject 10 Units into the skin at bedtime.    [provider]  LORazepam  (ATIVAN ) 1 MG tablet Take 1 mg by mouth daily as needed for anxiety or sleep. 07/11/23   [provider]  losartan  (COZAAR ) 100 MG tablet Take 150 mg by mouth daily. 03/25/23   [provider]  methylphenidate 18 MG PO CR tablet Take 18 mg by mouth every morning. 08/21/23   [provider]  metoCLOPramide  (REGLAN ) 10 MG tablet Take 1 tablet (10 mg total) by mouth every 8 (eight) hours as needed for nausea. Patient not taking: Reported on 10/23/2023 10/14/23   Jonel Lonni SQUIBB, MD  OLANZapine  zydis (ZYPREXA ) 5 MG disintegrating tablet Take 5 mg by mouth at bedtime. 09/23/23 12/22/23  [provider]  ondansetron  (ZOFRAN -ODT) 4 MG disintegrating tablet Take 1 tablet (4 mg total) by mouth every 8 (eight) hours as needed for nausea or vomiting. 06/23/23   Davis, Jonathon H, MD  Pancrelipase , Lip-Prot-Amyl, 3000-9500 units CPEP Take 3,000 units of lipase  by mouth 3 (three) times daily. 09/23/23 12/22/23  [provider]  prochlorperazine  (COMPAZINE ) 5 MG tablet Take 5 mg by mouth every 6 (six) hours as needed for nausea. 09/23/23   [provider]  sevelamer  carbonate (RENVELA ) 800 MG tablet Take 2,400 mg by mouth 3 (three) times daily with meals.    [provider]  sodium bicarbonate  650 MG tablet Take 1,300 mg by mouth 2 (two) times daily. 07/29/23   [provider]  SUMAtriptan  (IMITREX ) 50 MG tablet Take 50 mg by mouth every 2 (two) hours as needed for migraine or headache. Patient not taking: Reported on 10/23/2023 06/20/22   [provider]     Vitals:   10/25/23 1154 10/25/23 1230 10/25/23 1315 10/25/23 1615  BP:  135/83 (!) 143/86 (!) 150/82  Pulse:  81 81 79  Resp:    18  Temp: 98 F (36.7 C)     TempSrc: Oral     SpO2:  97% 95% 96%  Weight:      Height:       Exam Gen alert, no distress No rash, cyanosis or gangrene Sclera anicteric, throat clear  No jvd or bruits Chest clear  bilat to bases, no rales/ wheezing RRR no MRG Abd soft ntnd no mass or ascites +bs GU deferred MS no joint effusions or deformity Ext 1-2+ LE  edema, no other edema Neuro is alert, Ox 3 , nf         OP HD: TTS [facility] [address]  From April 2025 -> 3h  B350  57.5kjg  R AVG  heparin  1600 + 600 u/hr    Assessment/ Plan: DKA/ DM1: getting IV protocol per pmd ESRD: on HD TTS. Last HD was inpt on 6/25, missed yesterday. Pt stable today and on IV insulin . Will plan HD tomorrow. If she is off IV insulin  she can be done upstairs.  HTN: bp's wnl, cont home meds as needed Volume: mild- mod LE edema, no resp issues, on RA, CXR negative. Plan max UF w/ HD.  Anemia of esrd: Hb 10-11 here, follow.  Bipolar d/o: per coralee Myer Fret  MD [facility] 10/25/2023, 4:39 PM  Recent Labs  Lab 10/23/23 0310 10/23/23 0356 10/25/23 0748 10/25/23 0854 10/25/23 1134  HGB 10.3*   < > 10.0* 11.9*  --   ALBUMIN  2.9*  --  2.9*  --   --   CALCIUM  8.6*  --  8.8*  --   --   PHOS 6.5*  --   --   --   --   CREATININE 8.96*  --  7.64*  --  7.71*  K 5.2*   < > 5.6* 5.4*  --    < > = values in this interval not displayed.   Inpatient medications:  atorvastatin   80 mg Oral Daily   bumetanide   2 mg Oral Daily   [START ON 10/26/2023] calcitRIOL   1.25 mcg Oral Q T,Th,Sat-1800   carvedilol   25 mg Oral BID WC   DULoxetine   20 mg Oral Daily   famotidine   20 mg Oral BID   heparin   5,000 Units Subcutaneous Q8H   lamoTRIgine   200 mg Oral Daily   losartan   150 mg Oral Daily   methylphenidate  18 mg Oral q morning   OLANZapine  zydis  5 mg Oral QHS   Pancrelipase  (Lip-Prot-Amyl)   Oral TID   sevelamer  carbonate  2,400 mg Oral TID WC   sodium bicarbonate   1,300 mg Oral BID    insulin  7 Units/hr (10/25/23 1613)   albuterol , dextrose 

## 2023-10-25 NOTE — ED Notes (Signed)
 CCMD contacted to admit the patient to cardiac monitoring.

## 2023-10-25 NOTE — ED Provider Notes (Signed)
 [facility] AT [facility] Provider Note   CSN: [id number] Arrival date & time: 10/25/23  9255     Patient presents with: Chest Pain   Ashley Freeman is a 33 y.o. female with a history of type 1 diabetes mellitus, HFrEF, and ESRD on hemodialysis T/Th/Sat who presents to the ED today for chest pain. Patient reports pains in the chest and throughout her body. States that the body pains are worse than the chest pain and has been going on for months. Her body pains are so bad she just lays around at home and has not been taking her insulin  for the past day or two. Has associated swelling of the legs and abdomen, which is typical for her as well. Has a history of heart failure for which she is taking Bumex  three times a day without improvement of swelling. Dialysis tends to help with the swelling but she missed her appointment yesterday.  States that she gets to [facility] in [place] for dialysis due to her insurance, despite living in [place], but has a hard time getting there due to lack of transportation or childcare.    Prior to Admission medications   Medication Sig Start Date End Date Taking? Authorizing Provider  albuterol  (VENTOLIN  HFA) 108 (90 Base) MCG/ACT inhaler Inhale 2 puffs into the lungs every 4 (four) hours as needed for wheezing or shortness of breath. 11/24/22   [provider]  atorvastatin  (LIPITOR) 80 MG tablet Take 1 tablet (80 mg total) by mouth daily. 10/15/23   Danford, Lonni SQUIBB, MD  bumetanide  (BUMEX ) 2 MG tablet Take 2 mg by mouth daily.    [provider]  calcitRIOL  (ROCALTROL ) 0.25 MCG capsule Take 5 capsules (1.25 mcg total) by mouth every Tuesday, Thursday, and Saturday at 6 PM. 07/09/23   Cindy Garnette POUR, MD  carvedilol  (COREG ) 25 MG tablet Take 25 mg by mouth 2 (two) times daily with a meal. 05/15/23   [provider]  Continuous Glucose Sensor (DEXCOM G7 SENSOR) MISC Change sensors every 10  days 09/07/22   Shamleffer, Ibtehal Jaralla, MD  cyclobenzaprine  (FLEXERIL ) 5 MG tablet Take 1 tablet (5 mg total) by mouth 3 (three) times daily as needed for muscle spasms. 08/31/23   Ghimire, Donalda HERO, MD  DULoxetine  (CYMBALTA ) 20 MG capsule Take 20 mg by mouth daily. 07/29/23   [provider]  famotidine  (PEPCID ) 20 MG tablet Take 20 mg by mouth 2 (two) times daily. 07/29/23   [provider]  insulin  aspart (NOVOLOG ) 100 UNIT/ML injection Inject 5 Units into the skin 3 (three) times daily as needed for high blood sugar.    [provider]  lamoTRIgine  (LAMICTAL ) 200 MG tablet Take 200 mg by mouth daily. 07/11/23   [provider]  LANTUS  SOLOSTAR 100 UNIT/ML Solostar Pen Inject 10 Units into the skin at bedtime.    [provider]  LORazepam  (ATIVAN ) 1 MG tablet Take 1 mg by mouth daily as needed for anxiety or sleep. 07/11/23   [provider]  losartan  (COZAAR ) 100 MG tablet Take 150 mg by mouth daily. 03/25/23   [provider]  methylphenidate 18 MG PO CR tablet Take 18 mg by mouth every morning. 08/21/23   [provider]  metoCLOPramide  (REGLAN ) 10 MG tablet Take 1 tablet (10 mg total) by mouth every 8 (eight) hours as needed for nausea. Patient not taking: Reported on 10/23/2023 10/14/23   Jonel Lonni SQUIBB, MD  OLANZapine  zydis (ZYPREXA )  5 MG disintegrating tablet Take 5 mg by mouth at bedtime. 09/23/23 12/22/23  [provider]  ondansetron  (ZOFRAN -ODT) 4 MG disintegrating tablet Take 1 tablet (4 mg total) by mouth every 8 (eight) hours as needed for nausea or vomiting. 06/23/23   Davis, Jonathon H, MD  Pancrelipase , Lip-Prot-Amyl, 3000-9500 units CPEP Take 3,000 units of lipase by mouth 3 (three) times daily. 09/23/23 12/22/23  [provider]  prochlorperazine  (COMPAZINE ) 5 MG tablet Take 5 mg by mouth every 6 (six) hours as needed for nausea. 09/23/23   [provider]  sevelamer  carbonate  (RENVELA ) 800 MG tablet Take 2,400 mg by mouth 3 (three) times daily with meals.    [provider]  sodium bicarbonate  650 MG tablet Take 1,300 mg by mouth 2 (two) times daily. 07/29/23   [provider]  SUMAtriptan  (IMITREX ) 50 MG tablet Take 50 mg by mouth every 2 (two) hours as needed for migraine or headache. Patient not taking: Reported on 10/23/2023 06/20/22   [provider]    Allergies: Cephalexin , Morphine , Penicillins, Fish allergy, Benadryl  [diphenhydramine ], Dilaudid  [hydromorphone ], Doxycycline , Methotrexate derivatives, and Oxycodone     Review of Systems  Cardiovascular:  Positive for chest pain.  Gastrointestinal:  Positive for abdominal distention.  All other systems reviewed and are negative.   Updated Vital Signs BP (!) 143/86   Pulse 81   Temp 98 F (36.7 C) (Oral)   Resp (!) 31   Ht 5' 3 (1.6 m)   Wt 59 kg   LMP  (LMP Unknown)   SpO2 95%   BMI 23.03 kg/m   Physical Exam Vitals and nursing note reviewed.  Constitutional:      Appearance: Normal appearance.  HENT:     Head: Normocephalic and atraumatic.     Mouth/Throat:     Mouth: Mucous membranes are moist.   Eyes:     Conjunctiva/sclera: Conjunctivae normal.     Pupils: Pupils are equal, round, and reactive to light.    Cardiovascular:     Rate and Rhythm: Normal rate and regular rhythm.     Pulses: Normal pulses.     Heart sounds: Normal heart sounds.  Pulmonary:     Effort: Pulmonary effort is normal.     Breath sounds: Normal breath sounds.  Abdominal:     General: There is distension.     Palpations: Abdomen is soft.     Tenderness: There is abdominal tenderness. There is no right CVA tenderness or left CVA tenderness.     Comments: Generalized TTP with distention   Musculoskeletal:        General: Normal range of motion.     Cervical back: Normal range of motion.     Right lower leg: Edema present.     Left lower leg: Edema present.   Skin:    General:  Skin is warm and dry.     Findings: No rash.   Neurological:     General: No focal deficit present.     Mental Status: She is alert.   Psychiatric:        Mood and Affect: Mood normal.        Behavior: Behavior normal.    (all labs ordered are listed, but only abnormal results are displayed) Labs Reviewed  COMPREHENSIVE METABOLIC PANEL WITH GFR - Abnormal; Notable for the following components:      Result Value   Sodium 126 (*)    Potassium 5.6 (*)    Chloride 87 (*)  CO2 15 (*)    Glucose, Bld 821 (*)    BUN 55 (*)    Creatinine, Ser 7.64 (*)    Calcium  8.8 (*)    Total Protein 5.5 (*)    Albumin  2.9 (*)    Alkaline Phosphatase 495 (*)    Total Bilirubin 2.4 (*)    GFR, Estimated 7 (*)    Anion gap 24 (*)    All other components within normal limits  CBC WITH DIFFERENTIAL/PLATELET - Abnormal; Notable for the following components:   RBC 3.67 (*)    Hemoglobin 10.0 (*)    HCT 32.7 (*)    All other components within normal limits  BRAIN NATRIURETIC PEPTIDE - Abnormal; Notable for the following components:   B Natriuretic Peptide >4,500.0 (*)    All other components within normal limits  BETA-HYDROXYBUTYRIC ACID - Abnormal; Notable for the following components:   Beta-Hydroxybutyric Acid 5.64 (*)    All other components within normal limits  CREATININE, SERUM - Abnormal; Notable for the following components:   Creatinine, Ser 7.71 (*)    GFR, Estimated 7 (*)    All other components within normal limits  CBG MONITORING, ED - Abnormal; Notable for the following components:   Glucose-Capillary >600 (*)    All other components within normal limits  I-STAT VENOUS BLOOD GAS, ED - Abnormal; Notable for the following components:   pCO2, Ven 29.7 (*)    pO2, Ven 57 (*)    Bicarbonate 15.4 (*)    TCO2 16 (*)    Acid-base deficit 9.0 (*)    Sodium 123 (*)    Potassium 5.4 (*)    Calcium , Ion 1.00 (*)    HCT 35.0 (*)    Hemoglobin 11.9 (*)    All other components within  normal limits  CBG MONITORING, ED - Abnormal; Notable for the following components:   Glucose-Capillary >600 (*)    All other components within normal limits  CBG MONITORING, ED - Abnormal; Notable for the following components:   Glucose-Capillary >600 (*)    All other components within normal limits  CBG MONITORING, ED - Abnormal; Notable for the following components:   Glucose-Capillary >600 (*)    All other components within normal limits  CBG MONITORING, ED - Abnormal; Notable for the following components:   Glucose-Capillary >600 (*)    All other components within normal limits  HCG, SERUM, QUALITATIVE  URINALYSIS, ROUTINE W REFLEX MICROSCOPIC  HIV ANTIBODY (ROUTINE TESTING W REFLEX)  TROPONIN I (HIGH SENSITIVITY)  TROPONIN I (HIGH SENSITIVITY)    EKG: None  Radiology: DG Chest 2 View Result Date: 10/25/2023 CLINICAL DATA:  Chest pain. EXAM: CHEST - 2 VIEW COMPARISON:  10/23/2023. FINDINGS: Low lung volume. Complete clearing of previously noted pulmonary vascular congestion. Bilateral lung fields are essentially clear converting probable minimal scarring/atelectasis overlying the left lower lung zone, laterally. No acute consolidation or lung collapse. Bilateral costophrenic angles are clear. Stable mildly enlarged cardio-mediastinal silhouette, which is likely accentuated by low lung volume and AP technique. No acute osseous abnormalities. The soft tissues are within normal limits. IMPRESSION: No active cardiopulmonary disease. Electronically Signed   By: Ree Molt M.D.   On: 10/25/2023 08:33     .Critical Care  Performed by: Waddell Sluder, PA-C Authorized by: Waddell Sluder, PA-C   Critical care provider statement:    Critical care time (minutes):  30   Critical care was necessary to treat or prevent imminent or life-threatening deterioration of the following  conditions:  Endocrine crisis   Critical care was time spent personally by me on the following activities:   Blood draw for specimens, development of treatment plan with patient or surrogate, discussions with consultants, evaluation of patient's response to treatment, examination of patient, ordering and performing treatments and interventions, ordering and review of laboratory studies, ordering and review of radiographic studies, pulse oximetry, re-evaluation of patient's condition and review of old charts   Care discussed with: admitting provider      Medications Ordered in the ED  insulin  regular, human (MYXREDLIN ) 100 units/ 100 mL infusion (5.5 Units/hr Intravenous Rate/Dose Verify 10/25/23 1303)  dextrose  50 % solution 0-50 mL (has no administration in time range)  enoxaparin  (LOVENOX ) injection 30 mg (has no administration in time range)  metoCLOPramide  (REGLAN ) injection 10 mg (10 mg Intravenous Given 10/25/23 0810)  fentaNYL  (SUBLIMAZE ) injection 50 mcg (50 mcg Intravenous Given 10/25/23 0810)  lactated ringers  bolus 500 mL (0 mLs Intravenous Stopped 10/25/23 1226)  pantoprazole  (PROTONIX ) injection 40 mg (40 mg Intravenous Given 10/25/23 1136)                                    Medical Decision Making Amount and/or Complexity of Data Reviewed Labs: ordered. Radiology: ordered.  Risk Prescription drug management. Decision regarding hospitalization.   This patient presents to the ED for concern of chest pain and body pain, this involves an extensive number of treatment options, and is a complaint that carries with it a high risk of complications and morbidity.   Differential diagnosis includes: ACS, CHF exacerbation, DKA, hyperglycemia, electrolyte derangement, etc.   Comorbidities  See HPI above   Additional History  Additional history obtained from prior ED notes   Lab Tests  I ordered and personally interpreted labs.  The pertinent results include:   CBG >600 CO2 of 29.7, Bicarb of 15.4, acid-base deficiency of 9, sodium of 123, potassium of 5.4 on VBG Glucose of 821,  sodium of 126, potassium of 5.6, BUN of 55, Cr of 7.64, alk phos of 495, total bilirubin of 2.4, GFR of 7, anion gap of 24 Beta hydroxybutyric acid level of 5.64 Troponin of 16 BNP >4,500 Negative pregnancy test   Imaging Studies  I ordered imaging studies including CXR  I independently visualized and interpreted imaging which showed:  No active cardiopulmonary disease I agree with the radiologist interpretation   Consultations  I requested consultation with Dr. Georgina with [facility],  and discussed lab and imaging findings as well as pertinent plan - they recommend: admission for further work up and evaluation. I requested consultation with Dr. Geralynn with ESRD nephrology,  and discussed lab and imaging findings as well as pertinent plan - they recommend: he will consult. Patient may need dialysis since she missed her appointment yesterday.   Problem List / ED Course / Critical Interventions / Medication Management  Patient reports chest pain and generalized body pain that has been going on for several months now.  States that pain occurs intermittently. She is also having swelling in her abdomen and legs which is also chronic.  Has history of heart failure and is taking Bumex  3 times a day.  Denies any shortness of breath.  States that she is diabetic but has not been taking her diabetes medication because she has pain with movement second to her body aches.  Has associated nausea but no vomiting. No fevers.  Endorses abdominal  pain secondary to distention.  No changes to urinary or bowel habits. Patient's blood pressures have been soft in the ED in the low 100s systolically.  Patient switched to smaller blood pressure cuff and blood pressure is now 128/60. Patient staffed with my attending, Dr. Bernard I ordered medications including: Reglan  for nausea Insulin  and 500 mL bolus of fluids for DKA  Protonix  for heart burn I have reviewed the patients home medicines and have made adjustments  as needed   Social Determinants of Health  Physical activity   Test / Admission - Considered  Discussed findings with patient. She is agreeable with plan for admission.    Final diagnoses:  Type 1 diabetes mellitus with ketoacidosis without coma [facility])    ED Discharge Orders     None          Waddell Sluder, PA-C 10/25/23 1533    Bernard Drivers, MD 10/28/23 1344

## 2023-10-25 NOTE — ED Triage Notes (Signed)
 Pt BIB [facility] from home for CP, generalized pain, and high blood sugars. Pt given 324 asa en route, VSS, CBG reading High per EMS. Pt receives dialysis T/Th/Sat, missed dialysis yesterday.

## 2023-10-25 NOTE — H&P (Addendum)
 History and Physical    Patient: Ashley Freeman FMW:[id number] DOB: 1991-04-07 DOA: 10/25/2023 DOS: the patient was seen and examined on 10/25/2023 PCP: Keven Crumbly Pap, MD  Patient coming from: Home  Chief Complaint:  Chief Complaint  Patient presents with   Chest Pain   HPI: Ashley Freeman is a 33 y.o. female with medical history significant of ESRD HD TTS, HFrEF (EF 30-35% in 08/2023), DM1, prolonged QTc, and bipolar disorder p/w DKA and chest pain c/b SOB iso volume overload 2/2 ADHF and ESRD.   Pt states that she was in her USOH until this morning when she began to have cp iso BLE swelling after missing HD again yesterday due to lack of childcare (pt previously missed OP HD on 6/24 and required IP HD). Pt denies current cp.   In the ED, pt tachypneic on RA. Labs notable for BUN/Cr 55/7.64, anion gap 24, beta-hydroxybuterate 5.64, BNP >4500, troponin 13, and glucose 821. Pt admitted to medicine with nephrology following for HD.  Review of Systems: As mentioned in the history of present illness. All other systems reviewed and are negative. Past Medical History:  Diagnosis Date   Abscess, gluteal, right 08/24/2013   Anemia 02/19/2012   Bartholin's gland abscess 09/19/2013   Bipolar disorder (HCC)    BV (bacterial vaginosis) 11/24/2015   Depression    Diabetes mellitus type I (HCC) 2001   Diagnosed at age 42 ; Type I   Diarrhea 05/30/2016   DKA (diabetic ketoacidoses) 08/19/2013   Also in 2018   ESRD (end stage renal disease) (HCC)    Gonorrhea 08/2011   Treated in 09/2011   HFrEF (heart failure with reduced ejection fraction) (HCC)    a. 2022 Echo: EF 40%; b. 10/2021 Echo: EF 55%; b. 07/2022 MV: No ischemia. EF 31%; c. 08/2022 Echo: EF 35%, mildly dil RV, sev TR.   History of trichomoniasis 05/31/2016   Hyperlipidemia 03/28/2016   Hypertension    NICM (nonischemic cardiomyopathy) (HCC)    Sepsis (HCC) 09/19/2013   Past Surgical History:   Procedure Laterality Date   A/V FISTULAGRAM Right 06/17/2023   Procedure: A/V Fistulagram;  Surgeon: Marea Selinda GORMAN, MD;  Location: [facility] INVASIVE CV LAB;  Service: Cardiovascular;  Laterality: Right;   A/V SHUNT INTERVENTION N/A 09/25/2023   Procedure: A/V SHUNT INTERVENTION;  Surgeon: Pearline Norman GORMAN, MD;  Location: [facility];  Service: Cardiovascular;  Laterality: N/A;   AV FISTULA PLACEMENT Right 07/06/2022   Procedure: ARTERIOVENOUS GRAFT CREATION;  Surgeon: Gretta Lonni PARAS, MD;  Location: [facility] OR;  Service: Vascular;  Laterality: Right;   CESAREAN SECTION N/A 10/05/2019   Procedure: CESAREAN SECTION;  Surgeon: Izell Harari, MD;  Location: [facility] ORS;  Service: Obstetrics;  Laterality: N/A;   CHOLECYSTECTOMY N/A 07/02/2023   Procedure: LAPAROSCOPIC CHOLECYSTECTOMY;  Surgeon: Ebbie Cough, MD;  Location: [facility] OR;  Service: General;  Laterality: N/A;   INCISION AND DRAINAGE ABSCESS Left 09/28/2019   Procedure: INCISION AND DRAINAGE VULVAR ABCESS;  Surgeon: Edsel Norleen GAILS, MD;  Location: [name] OR;  Service: Gynecology;  Laterality: Left;   INCISION AND DRAINAGE PERIRECTAL ABSCESS Right 08/18/2013   Procedure: IRRIGATION AND DEBRIDEMENT GLUTEAL ABSCESS;  Surgeon: Lynda Leos, MD;  Location: [facility] OR;  Service: General;  Laterality: Right;   INCISION AND DRAINAGE PERIRECTAL ABSCESS Right 09/19/2013   Procedure: IRRIGATION AND DEBRIDEMENT RIGHT GLUTEAL AND LABIAL ABSCESSES;  Surgeon: Lynda Leos, MD;  Location: [facility] OR;  Service: General;  Laterality: Right;   INCISION AND  DRAINAGE PERIRECTAL ABSCESS Right 09/24/2013   Procedure: IRRIGATION AND DEBRIDEMENT PERIRECTAL ABSCESS;  Surgeon: Lynwood MALVA Pina, MD;  Location: [facility] OR;  Service: General;  Laterality: Right;   IR PARACENTESIS  08/28/2023   Social History:  reports that she has never smoked. She has never been exposed to tobacco smoke. She has never used smokeless tobacco. She reports that she does not currently use alcohol. She  reports that she does not use drugs.  Allergies  Allergen Reactions   Cephalexin  Anaphylaxis    Has gotten ceftriaxone  in the past    Morphine  Itching   Penicillins Hives and Rash   Fish Allergy    Benadryl  [Diphenhydramine ] Itching   Dilaudid  [Hydromorphone ] Itching   Doxycycline  Itching   Methotrexate Derivatives Rash   Oxycodone  Itching    Family History  Problem Relation Age of Onset   Asthma Mother    Carpal tunnel syndrome Mother    Gout Father    Diabetes Paternal Grandmother    Anesthesia problems Neg Hx     Prior to Admission medications   Medication Sig Start Date End Date Taking? Authorizing Provider  albuterol  (VENTOLIN  HFA) 108 (90 Base) MCG/ACT inhaler Inhale 2 puffs into the lungs every 4 (four) hours as needed for wheezing or shortness of breath. 11/24/22   [provider]  atorvastatin  (LIPITOR) 80 MG tablet Take 1 tablet (80 mg total) by mouth daily. 10/15/23   Danford, Lonni SQUIBB, MD  bumetanide  (BUMEX ) 2 MG tablet Take 2 mg by mouth daily.    [provider]  calcitRIOL  (ROCALTROL ) 0.25 MCG capsule Take 5 capsules (1.25 mcg total) by mouth every Tuesday, Thursday, and Saturday at 6 PM. 07/09/23   Cindy Garnette POUR, MD  carvedilol  (COREG ) 25 MG tablet Take 25 mg by mouth 2 (two) times daily with a meal. 05/15/23   [provider]  Continuous Glucose Sensor (DEXCOM G7 SENSOR) MISC Change sensors every 10 days 09/07/22   Shamleffer, Ibtehal Jaralla, MD  cyclobenzaprine  (FLEXERIL ) 5 MG tablet Take 1 tablet (5 mg total) by mouth 3 (three) times daily as needed for muscle spasms. 08/31/23   Ghimire, Donalda HERO, MD  DULoxetine  (CYMBALTA ) 20 MG capsule Take 20 mg by mouth daily. 07/29/23   [provider]  famotidine  (PEPCID ) 20 MG tablet Take 20 mg by mouth 2 (two) times daily. 07/29/23   [provider]  insulin  aspart (NOVOLOG ) 100 UNIT/ML injection Inject 5 Units into the skin 3 (three) times daily as needed for high blood sugar.     [provider]  lamoTRIgine  (LAMICTAL ) 200 MG tablet Take 200 mg by mouth daily. 07/11/23   [provider]  LANTUS  SOLOSTAR 100 UNIT/ML Solostar Pen Inject 10 Units into the skin at bedtime.    [provider]  LORazepam  (ATIVAN ) 1 MG tablet Take 1 mg by mouth daily as needed for anxiety or sleep. 07/11/23   [provider]  losartan  (COZAAR ) 100 MG tablet Take 150 mg by mouth daily. 03/25/23   [provider]  methylphenidate 18 MG PO CR tablet Take 18 mg by mouth every morning. 08/21/23   [provider]  metoCLOPramide  (REGLAN ) 10 MG tablet Take 1 tablet (10 mg total) by mouth every 8 (eight) hours as needed for nausea. Patient not taking: Reported on 10/23/2023 10/14/23   Jonel Lonni SQUIBB, MD  OLANZapine  zydis (ZYPREXA ) 5 MG disintegrating tablet Take 5 mg by mouth at bedtime. 09/23/23 12/22/23  [provider]  ondansetron  (ZOFRAN -ODT) 4  MG disintegrating tablet Take 1 tablet (4 mg total) by mouth every 8 (eight) hours as needed for nausea or vomiting. 06/23/23   Nicholaus Cassondra DEL, MD  Pancrelipase , Lip-Prot-Amyl, 3000-9500 units CPEP Take 3,000 units of lipase by mouth 3 (three) times daily. 09/23/23 12/22/23  [provider]  prochlorperazine  (COMPAZINE ) 5 MG tablet Take 5 mg by mouth every 6 (six) hours as needed for nausea. 09/23/23   [provider]  sevelamer  carbonate (RENVELA ) 800 MG tablet Take 2,400 mg by mouth 3 (three) times daily with meals.    [provider]  sodium bicarbonate  650 MG tablet Take 1,300 mg by mouth 2 (two) times daily. 07/29/23   [provider]  SUMAtriptan  (IMITREX ) 50 MG tablet Take 50 mg by mouth every 2 (two) hours as needed for migraine or headache. Patient not taking: Reported on 10/23/2023 06/20/22   [provider]    Physical Exam: Vitals:   10/25/23 1124 10/25/23 1154 10/25/23 1230 10/25/23 1315  BP: 128/68  135/83 (!) 143/86  Pulse: 74  81 81   Resp: (!) 31     Temp:  98 F (36.7 C)    TempSrc:  Oral    SpO2: 100%  97% 95%  Weight:      Height:       General: Alert, oriented x3, resting comfortably in no acute distress Respiratory: Bibasilar rales w/o wheezing Cardiovascular: Regular rate and rhythm w/o m/r/g   Data Reviewed:  Lab Results  Component Value Date   WBC 8.3 10/25/2023   HGB 11.9 (L) 10/25/2023   HCT 35.0 (L) 10/25/2023   MCV 89.1 10/25/2023   PLT 297 10/25/2023   Lab Results  Component Value Date   GLUCOSE 821 (HH) 10/25/2023   CALCIUM  8.8 (L) 10/25/2023   NA 123 (L) 10/25/2023   K 5.4 (H) 10/25/2023   CO2 15 (L) 10/25/2023   CL 87 (L) 10/25/2023   BUN 55 (H) 10/25/2023   CREATININE 7.71 (H) 10/25/2023   Lab Results  Component Value Date   ALT 33 10/25/2023   AST 23 10/25/2023   ALKPHOS 495 (H) 10/25/2023   BILITOT 2.4 (H) 10/25/2023   Lab Results  Component Value Date   INR 1.4 (H) 07/20/2023   INR 1.1 04/24/2021   INR 1.2 04/08/2021    Radiology: DG Chest 2 View Result Date: 10/25/2023 CLINICAL DATA:  Chest pain. EXAM: CHEST - 2 VIEW COMPARISON:  10/23/2023. FINDINGS: Low lung volume. Complete clearing of previously noted pulmonary vascular congestion. Bilateral lung fields are essentially clear converting probable minimal scarring/atelectasis overlying the left lower lung zone, laterally. No acute consolidation or lung collapse. Bilateral costophrenic angles are clear. Stable mildly enlarged cardio-mediastinal silhouette, which is likely accentuated by low lung volume and AP technique. No acute osseous abnormalities. The soft tissues are within normal limits. IMPRESSION: No active cardiopulmonary disease. Electronically Signed   By: Ree Molt M.D.   On: 10/25/2023 08:33    Assessment and Plan: 22F h/o ESRD HD TTS, HFrEF (EF 30-35% in 08/2023), DM1, prolonged QTc, and bipolar disorder p/w DKA and chest pain c/b SOB iso volume overload 2/2 ADHF and ESRD.  DKA H/o DM1 -IV insulin   per Endotool protocol -NPO for now; admit to PCU   Chest pain c/b SOB iso volume overload 22/ ADHF and ESRD -Nephrology consulted; apprec eval/recs -PTA calcitriol , Renvela , and sodium bicarbonate    HFrEF exacerbation c/b ESRD -PTA bumex  2mg  dialy -PTA coreg  BID, losartan  daily, and crestor  daily  BPD -PTA olanzapine , lamictal  and Cymbalta    Advance Care Planning:   Code Status: Full Code   Consults: Renal  Family Communication: N/A  Severity of Illness: The appropriate patient status for this patient is INPATIENT. Inpatient status is judged to be reasonable and necessary in order to provide the required intensity of service to ensure the patient's safety. The patient's presenting symptoms, physical exam findings, and initial radiographic and laboratory data in the context of their chronic comorbidities is felt to place them at high risk for further clinical deterioration. Furthermore, it is not anticipated that the patient will be medically stable for discharge from the hospital within 2 midnights of admission.   * I certify that at the point of admission it is my clinical judgment that the patient will require inpatient hospital care spanning beyond 2 midnights from the point of admission due to high intensity of service, high risk for further deterioration and high frequency of surveillance required.*   ------- I spent 55 minutes reviewing previous labs/notes, obtaining separate history at the bedside, counseling/discussing the treatment plan outlined above, ordering medications/tests, and performing clinical documentation.  Author: Marsha Ada, MD 10/25/2023 2:02 PM  For on call review [email or web address].

## 2023-10-26 DIAGNOSIS — E1010 Type 1 diabetes mellitus with ketoacidosis without coma: Secondary | ICD-10-CM

## 2023-10-26 LAB — BASIC METABOLIC PANEL WITH GFR
Anion gap: 18 — ABNORMAL HIGH (ref 5–15)
BUN: 65 mg/dL — ABNORMAL HIGH (ref 6–20)
CO2: 22 mmol/L (ref 22–32)
Calcium: 8.4 mg/dL — ABNORMAL LOW (ref 8.9–10.3)
Chloride: 92 mmol/L — ABNORMAL LOW (ref 98–111)
Creatinine, Ser: 8.14 mg/dL — ABNORMAL HIGH (ref 0.44–1.00)
GFR, Estimated: 6 mL/min — ABNORMAL LOW (ref >60)
Glucose, Bld: 71 mg/dL (ref 70–99)
Potassium: 4.5 mmol/L (ref 3.5–5.1)
Sodium: 132 mmol/L — ABNORMAL LOW (ref 135–145)

## 2023-10-26 LAB — BLOOD GAS, ARTERIAL
Acid-base deficit: 5.6 mmol/L — ABNORMAL HIGH (ref 0.0–2.0)
Bicarbonate: 20.1 mmol/L (ref 20.0–28.0)
O2 Saturation: 94.3 %
Patient temperature: 36.5
pCO2 arterial: 38 mmHg (ref 32–48)
pH, Arterial: 7.33 — ABNORMAL LOW (ref 7.35–7.45)
pO2, Arterial: 67 mmHg — ABNORMAL LOW (ref 83–108)

## 2023-10-26 LAB — URINALYSIS, ROUTINE W REFLEX MICROSCOPIC
Bilirubin Urine: NEGATIVE
Glucose, UA: >=500 mg/dL — AB
Ketones, ur: 5 mg/dL — AB
Leukocytes,Ua: NEGATIVE
Nitrite: NEGATIVE
Protein, ur: >=300 mg/dL — AB
Specific Gravity, Urine: 1.016 (ref 1.005–1.030)
pH: 6 (ref 5.0–8.0)

## 2023-10-26 LAB — GLUCOSE, CAPILLARY
Glucose-Capillary: 106 mg/dL — ABNORMAL HIGH (ref 70–99)
Glucose-Capillary: 119 mg/dL — ABNORMAL HIGH (ref 70–99)
Glucose-Capillary: 137 mg/dL — ABNORMAL HIGH (ref 70–99)
Glucose-Capillary: 140 mg/dL — ABNORMAL HIGH (ref 70–99)
Glucose-Capillary: 147 mg/dL — ABNORMAL HIGH (ref 70–99)
Glucose-Capillary: 149 mg/dL — ABNORMAL HIGH (ref 70–99)
Glucose-Capillary: 154 mg/dL — ABNORMAL HIGH (ref 70–99)
Glucose-Capillary: 164 mg/dL — ABNORMAL HIGH (ref 70–99)
Glucose-Capillary: 168 mg/dL — ABNORMAL HIGH (ref 70–99)
Glucose-Capillary: 181 mg/dL — ABNORMAL HIGH (ref 70–99)
Glucose-Capillary: 187 mg/dL — ABNORMAL HIGH (ref 70–99)
Glucose-Capillary: 197 mg/dL — ABNORMAL HIGH (ref 70–99)
Glucose-Capillary: 215 mg/dL — ABNORMAL HIGH (ref 70–99)
Glucose-Capillary: 234 mg/dL — ABNORMAL HIGH (ref 70–99)
Glucose-Capillary: 30 mg/dL — CL (ref 70–99)
Glucose-Capillary: 58 mg/dL — ABNORMAL LOW (ref 70–99)
Glucose-Capillary: 74 mg/dL (ref 70–99)
Glucose-Capillary: 76 mg/dL (ref 70–99)
Glucose-Capillary: 77 mg/dL (ref 70–99)
Glucose-Capillary: 81 mg/dL (ref 70–99)
Glucose-Capillary: 81 mg/dL (ref 70–99)
Glucose-Capillary: 94 mg/dL (ref 70–99)

## 2023-10-26 MED ORDER — DEXTROSE 50 % IV SOLN
25.0000 g | Freq: Once | INTRAVENOUS | Status: DC
  Administered 2023-10-26: 25 g via INTRAVENOUS

## 2023-10-26 MED ORDER — HEPARIN SODIUM (PORCINE) 1000 UNIT/ML DIALYSIS: 2000.0000 [IU] | Freq: Once | INTRAMUSCULAR | Status: DC

## 2023-10-26 MED ORDER — HEPARIN SODIUM (PORCINE) 1000 UNIT/ML DIALYSIS: 1000.0000 [IU] | INTRAMUSCULAR | Status: DC | PRN

## 2023-10-26 MED ORDER — INSULIN GLARGINE 100 UNITS/ML SOLOSTAR PEN: 7.0000 [IU] | PEN_INJECTOR | Freq: Every day | SUBCUTANEOUS | Status: DC

## 2023-10-26 MED ORDER — DEXTROSE IN LACTATED RINGERS 5 % IV SOLN: INTRAVENOUS | Status: DC

## 2023-10-26 MED ORDER — PANTOPRAZOLE SODIUM 40 MG IV SOLR
40.0000 mg | Freq: Two times a day (BID) | INTRAVENOUS | Status: DC
  Administered 2023-10-26: 40 mg via INTRAVENOUS
  Filled 2023-10-26: qty 10

## 2023-10-26 MED ORDER — INSULIN ASPART 100 UNIT/ML IJ SOLN
0.0000 [IU] | Freq: Three times a day (TID) | INTRAMUSCULAR | Status: DC
  Administered 2023-10-26: 2 [IU] via SUBCUTANEOUS
  Administered 2023-10-27: 1 [IU] via SUBCUTANEOUS
  Administered 2023-10-28: 6 [IU] via SUBCUTANEOUS

## 2023-10-26 MED ORDER — DEXTROSE 5 % IV SOLN: INTRAVENOUS | Status: DC

## 2023-10-26 MED ORDER — PANTOPRAZOLE SODIUM 40 MG PO TBEC
40.0000 mg | DELAYED_RELEASE_TABLET | Freq: Two times a day (BID) | ORAL | Status: DC
  Administered 2023-10-26 – 2023-10-29 (×5): 40 mg via ORAL
  Filled 2023-10-26 (×5): qty 1

## 2023-10-26 MED ORDER — FAMOTIDINE 20 MG PO TABS
20.0000 mg | ORAL_TABLET | Freq: Every day | ORAL | Status: DC
  Administered 2023-10-27 – 2023-10-29 (×3): 20 mg via ORAL
  Filled 2023-10-26 (×3): qty 1

## 2023-10-26 MED ORDER — HYDROMORPHONE HCL 1 MG/ML IJ SOLN
0.5000 mg | INTRAMUSCULAR | Status: AC
  Administered 2023-10-26: 0.5 mg via INTRAVENOUS
  Filled 2023-10-26: qty 0.5

## 2023-10-26 MED ORDER — GLUCAGON HCL RDNA (DIAGNOSTIC) 1 MG IJ SOLR: 1.0000 mg | Freq: Once | INTRAMUSCULAR | Status: DC | PRN

## 2023-10-26 MED ORDER — INSULIN GLARGINE 100 UNITS/ML SOLOSTAR PEN: 6.0000 [IU] | PEN_INJECTOR | Freq: Every day | SUBCUTANEOUS | Status: DC

## 2023-10-26 MED ORDER — PENTAFLUOROPROP-TETRAFLUOROETH EX AERO: 1.0000 | INHALATION_SPRAY | CUTANEOUS | Status: DC | PRN

## 2023-10-26 MED ORDER — PROCHLORPERAZINE EDISYLATE 10 MG/2ML IJ SOLN
10.0000 mg | Freq: Four times a day (QID) | INTRAMUSCULAR | Status: DC | PRN
  Administered 2023-10-26: 10 mg via INTRAVENOUS
  Filled 2023-10-26 (×2): qty 2

## 2023-10-26 MED ORDER — INSULIN GLARGINE-YFGN 100 UNIT/ML ~~LOC~~ SOLN
6.0000 [IU] | Freq: Every day | SUBCUTANEOUS | Status: DC
  Administered 2023-10-26 – 2023-10-27 (×2): 6 [IU] via SUBCUTANEOUS
  Filled 2023-10-26 (×3): qty 0.06

## 2023-10-26 MED ORDER — PANCRELIPASE (LIP-PROT-AMYL) 12000-38000 UNITS PO CPEP
12000.0000 [IU] | ORAL_CAPSULE | Freq: Three times a day (TID) | ORAL | Status: DC
  Administered 2023-10-26 – 2023-10-29 (×6): 12000 [IU] via ORAL
  Filled 2023-10-26 (×12): qty 1

## 2023-10-26 NOTE — Progress Notes (Signed)
 Patient c/o abdominal pain, wants to know when she can have Dilaudid  again.  Explained to patient that Dilaudid  has been discontinued but if she needed something for pain, RN would call MD.  Patient verbalized understanding.

## 2023-10-26 NOTE — Plan of Care (Signed)

## 2023-10-26 NOTE — Progress Notes (Signed)
 RN called to patient room for pain medication.  Patients states pain is 9/10, when RN asked patient where the pain was and description, patient tells RN that she fell on her way back to bed from bathroom.  Patient states that she missed the bed and fell on her bottom.  Denies hitting head, no obvious injuries, MAE, patient states that she feels a little dizzy.  VS taken and noted in chart.  Skin assessed, no bruising or abrasions noted.  Dr. Shona notified and orders received.  Patient placed on fall precautions, yellow arm band applied, bed alarm on, floor mats placed on bilateral sides of bed, side rails up x 3, bed in low position.  Explained fall risk precautions to patient and instructed patient to call for assistance prior to getting OOB.  Patient verbalized understanding.  Dilaudid  IV given for pain as ordered.

## 2023-10-26 NOTE — Plan of Care (Signed)
  Problem: Health Behavior/Discharge Planning: Goal: Ability to manage health-related needs will improve Outcome: Progressing   Problem: Clinical Measurements: Goal: Ability to maintain clinical measurements within normal limits will improve Outcome: Progressing   Problem: Clinical Measurements: Goal: Will remain free from infection Outcome: Progressing   Problem: Clinical Measurements: Goal: Diagnostic test results will improve Outcome: Progressing   

## 2023-10-26 NOTE — Progress Notes (Signed)
 [facility] Progress Note  Subjective:  Off IV insulin  this am per RN  Vitals:   10/26/23 0758 10/26/23 0759 10/26/23 0919 10/26/23 1034  BP: (!) 90/54 100/63 101/68 (!) 93/59  Pulse: 68     Resp: 18     Temp: 98 F (36.7 C)     TempSrc: Oral     SpO2:      Weight:      Height:        Exam: Gen alert, no distress Sclera anicteric, throat clear  No jvd or bruits Chest clear bilat to bases RRR no MRG Abd soft ntnd no mass or ascites +bs Ext 1-2+ LE  edema Neuro is alert, Ox 3 , nf            OP HD: TTS [address] [address]  From April 2025 -> 3h  B350  57.5kjg  R AVG  heparin  1600 + 600 u/hr       Assessment/ Plan: DKA/ DM1: started on IV insulin , off this am so far.  ESRD: on HD TTS. Last HD was inpt on 6/25. Missed OP HD 6/26. Plan is for iHD today.  HTN: bp's wnl, cont home meds as needed Volume: mild- mod LE edema, no resp issues, on RA, CXR negative. Plan max UF w/ HD.  Anemia of esrd: Hb 10-11 here, follow.  Bipolar d/o: per coralee Myer Fret MD  [facility] 10/26/2023, 11:07 AM  Recent Labs  Lab 10/23/23 0310 10/23/23 0356 10/25/23 0748 10/25/23 0854 10/25/23 1134  HGB 10.3*   < > 10.0* 11.9*  --   ALBUMIN  2.9*  --  2.9*  --   --   CALCIUM  8.6*  --  8.8*  --   --   PHOS 6.5*  --   --   --   --   CREATININE 8.96*  --  7.64*  --  7.71*  K 5.2*   < > 5.6* 5.4*  --    < > = values in this interval not displayed.   No results for input(s): IRON , TIBC, FERRITIN in the last 168 hours. Inpatient medications:  atorvastatin   80 mg Oral Daily   bumetanide   2 mg Oral Daily   calcitRIOL   1.25 mcg Oral Q T,Th,Sat-1800   carvedilol   25 mg Oral BID WC   Chlorhexidine  Gluconate Cloth  6 each Topical Q0600   DULoxetine   20 mg Oral Daily   famotidine   20 mg Oral BID   heparin   5,000 Units Subcutaneous Q8H   lamoTRIgine   200 mg Oral Daily   lipase/protease/amylase  12,000 Units Oral TID WC   methylphenidate  18 mg Oral q  morning   OLANZapine  zydis  5 mg Oral QHS   pantoprazole  (PROTONIX ) IV  40 mg Intravenous Q12H   sevelamer  carbonate  2,400 mg Oral TID WC   sodium bicarbonate   1,300 mg Oral BID    dextrose  50 mL/hr at 10/26/23 1041   insulin  Stopped (10/26/23 0840)   albuterol , glucagon  (human recombinant), HYDROmorphone  (DILAUDID ) injection, mouth rinse, polyethylene glycol, prochlorperazine 

## 2023-10-26 NOTE — Progress Notes (Signed)
 PROGRESS NOTE    Ashley Freeman  FMW:[id number] DOB: 02-26-1991 DOA: 10/25/2023 PCP: Keven Crumbly Pap, MD   Brief Narrative: 33 year old with medical history significant for ESRD on dialysis, TTS, heart failure with reduced ejection fraction 30 to 35% in May 2025, diabetes type 1, prolonged QT, bipolar presents with chest pain, shortness of breath, volume overload and DKA.  Patient began with chest pain and worsening shortness of breath after missing hemodialysis again the day prior to admission due to lack of childcare.  Previously needs hemodialysis 6/24.  She was found to be in DKA   Assessment & Plan:   Active Problems:   DKA (diabetic ketoacidosis) (HCC)   1-DKA Diabetes type 1, hyperglycemia uncontrolled --Presents with CBG 800, bicarb 15, 24.  --She was taken off insulin  gtt, CBG was low at 119.  -She had low Blood sugar after been transiently on Insulin  Gtt.  Hold insulin  gtt---had hypoglycemia. CBG  now 140 She is type 1, needs long acting insulin . Will start low dose.  Bicarb is at 22, gap could be elevated from Renal Failure.  Will keep D 5 for now.   2-Chest pain, shortness of breath in the setting of volume overload secondary to acute systolic  heart failure and ESRD Missed hemodialysis For HD today/   3-Acute heart failure reduced ejection fraction In the setting of ESRD HD today   Acute metabolic encephalopathy Multifactorial. Related to DKA, Hyperglycemia, Hypoglycemia.  Will check ABG  Bipolar disorder: - Continue on cefepime , Lamictal  and Cymbalta   Abdominal distension.  Might need paracentesis.   Estimated body mass index is 28.98 kg/m as calculated from the following:   Height as of this encounter: 5' 3 (1.6 m).   Weight as of this encounter: 74.2 kg.   DVT prophylaxis: heparin   Code Status: Full code Family Communication: Disposition Plan:  Status is: Inpatient Remains inpatient appropriate because: management of  DKA    Consultants:  Nephrology   Procedures:  HD  Antimicrobials:    Subjective: Sleepy, wake up  say few words, fall back to sleep.  When she had Hypoglycemia she was more lethargic.   Objective: Vitals:   10/25/23 1645 10/25/23 1900 10/25/23 2313 10/26/23 0334  BP: (!) 152/82  105/68 102/68  Pulse: 78  72 66  Resp: 19 18 18 19   Temp: 98.6 F (37 C) 98 F (36.7 C) 98 F (36.7 C) 98 F (36.7 C)  TempSrc: Oral Oral Oral Oral  SpO2: 95% 91% 92% 97%  Weight: 74.2 kg     Height: 5' 3 (1.6 m)       Intake/Output Summary (Last 24 hours) at 10/26/2023 0729 Last data filed at 10/26/2023 0341 Gross per 24 hour  Intake 36.13 ml  Output 100 ml  Net -63.87 ml   Filed Weights   10/25/23 0750 10/25/23 1645  Weight: 59 kg 74.2 kg    Examination:  General exam: Appears calm and comfortable  Respiratory system: Clear to auscultation. Respiratory effort normal. Cardiovascular system: S1 & S2 heard, RRR. No JVD, murmurs, rubs, gallops or clicks. No pedal edema. Gastrointestinal system: Abdomen is nondistended, soft and nontender. No organomegaly or masses felt. Normal bowel sounds heard. Central nervous system: sleepy follows some commands Extremities: no edema  Data Reviewed: I have personally reviewed following labs and imaging studies  CBC: Recent Labs  Lab 10/20/23 2240 10/23/23 0310 10/23/23 0356 10/25/23 0748 10/25/23 0854  WBC 8.7 11.3*  --  8.3  --   NEUTROABS 4.7 5.9  --  5.7  --   HGB 10.7* 10.3* 11.6* 10.0* 11.9*  HCT 36.1 33.5* 34.0* 32.7* 35.0*  MCV 92.8 89.6  --  89.1  --   PLT 174 239  --  297  --    Basic Metabolic Panel: Recent Labs  Lab 10/20/23 2240 10/23/23 0310 10/23/23 0356 10/25/23 0748 10/25/23 0854 10/25/23 1134  NA 138 136 135 126* 123*  --   K 4.3 5.2* 5.1 5.6* 5.4*  --   CL 99 97*  --  87*  --   --   CO2 28 20*  --  15*  --   --   GLUCOSE 161* 322*  --  821*  --   --   BUN 39* 65*  --  55*  --   --   CREATININE 6.70*  8.96*  --  7.64*  --  7.71*  CALCIUM  8.6* 8.6*  --  8.8*  --   --   PHOS  --  6.5*  --   --   --   --    GFR: Estimated Creatinine Clearance: 10.2 mL/min (A) (by C-G formula based on SCr of 7.71 mg/dL (H)). Liver Function Tests: Recent Labs  Lab 10/20/23 2240 10/23/23 0310 10/25/23 0748  AST 61* 58* 23  ALT 39 50* 33  ALKPHOS 628* 637* 495*  BILITOT 0.9 1.1 2.4*  PROT 5.2* 5.5* 5.5*  ALBUMIN  2.6* 2.9* 2.9*   Recent Labs  Lab 10/23/23 0310  LIPASE 19   No results for input(s): AMMONIA in the last 168 hours. Coagulation Profile: No results for input(s): INR, PROTIME in the last 168 hours. Cardiac Enzymes: No results for input(s): CKTOTAL, CKMB, CKMBINDEX, TROPONINI in the last 168 hours. BNP (last 3 results) No results for input(s): PROBNP in the last 8760 hours. HbA1C: No results for input(s): HGBA1C in the last 72 hours. CBG: Recent Labs  Lab 10/26/23 0236 10/26/23 0332 10/26/23 0432 10/26/23 0530 10/26/23 0630  GLUCAP 94 106* 147* 168* 187*   Lipid Profile: No results for input(s): CHOL, HDL, LDLCALC, TRIG, CHOLHDL, LDLDIRECT in the last 72 hours. Thyroid  Function Tests: No results for input(s): TSH, T4TOTAL, FREET4, T3FREE, THYROIDAB in the last 72 hours. Anemia Panel: No results for input(s): VITAMINB12, FOLATE, FERRITIN, TIBC, IRON , RETICCTPCT in the last 72 hours. Sepsis Labs: No results for input(s): PROCALCITON, LATICACIDVEN in the last 168 hours.  Recent Results (from the past 240 hours)  MRSA Next Gen by PCR, Nasal     Status: None   Collection Time: 10/25/23  4:57 PM   Specimen: Nasal Mucosa; Nasal Swab  Result Value Ref Range Status   MRSA by PCR Next Gen NOT DETECTED NOT DETECTED Final    Comment: (NOTE) The GeneXpert MRSA Assay (FDA approved for NASAL specimens only), is one component of a comprehensive MRSA colonization surveillance program. It is not intended to diagnose MRSA infection  nor to guide or monitor treatment for MRSA infections. Test performance is not FDA approved in patients less than 43 years old. Performed at [facility], [address], [place], [place] [address]          Radiology Studies: DG Chest 2 View Result Date: 10/25/2023 CLINICAL DATA:  Chest pain. EXAM: CHEST - 2 VIEW COMPARISON:  10/23/2023. FINDINGS: Low lung volume. Complete clearing of previously noted pulmonary vascular congestion. Bilateral lung fields are essentially clear converting probable minimal scarring/atelectasis overlying the left lower lung zone, laterally. No acute consolidation or lung collapse. Bilateral costophrenic angles  are clear. Stable mildly enlarged cardio-mediastinal silhouette, which is likely accentuated by low lung volume and AP technique. No acute osseous abnormalities. The soft tissues are within normal limits. IMPRESSION: No active cardiopulmonary disease. Electronically Signed   By: Ree Molt M.D.   On: 10/25/2023 08:33        Scheduled Meds:  atorvastatin   80 mg Oral Daily   bumetanide   2 mg Oral Daily   calcitRIOL   1.25 mcg Oral Q T,Th,Sat-1800   carvedilol   25 mg Oral BID WC   Chlorhexidine  Gluconate Cloth  6 each Topical Q0600   DULoxetine   20 mg Oral Daily   famotidine   20 mg Oral BID   heparin   5,000 Units Subcutaneous Q8H   lamoTRIgine   200 mg Oral Daily   losartan   150 mg Oral Daily   methylphenidate  18 mg Oral q morning   OLANZapine  zydis  5 mg Oral QHS   Pancrelipase  (Lip-Prot-Amyl)   Oral TID   sevelamer  carbonate  2,400 mg Oral TID WC   sodium bicarbonate   1,300 mg Oral BID   Continuous Infusions:  insulin  1.5 Units/hr (10/26/23 9367)     LOS: 1 day    Time spent: 35 minutes    Korvin Valentine A Avaline Stillson, MD [facility]   If 7PM-7AM, please contact night-coverage [email or web address]  10/26/2023, 7:29 AM

## 2023-10-27 DIAGNOSIS — E1010 Type 1 diabetes mellitus with ketoacidosis without coma: Secondary | ICD-10-CM | POA: Diagnosis not present

## 2023-10-27 LAB — CBC
HCT: 31.7 % — ABNORMAL LOW (ref 36.0–46.0)
Hemoglobin: 10.2 g/dL — ABNORMAL LOW (ref 12.0–15.0)
MCH: 27.5 pg (ref 26.0–34.0)
MCHC: 32.2 g/dL (ref 30.0–36.0)
MCV: 85.4 fL (ref 80.0–100.0)
Platelets: 336 10*3/uL (ref 150–400)
RBC: 3.71 MIL/uL — ABNORMAL LOW (ref 3.87–5.11)
RDW: 15.1 % (ref 11.5–15.5)
WBC: 9.2 10*3/uL (ref 4.0–10.5)
nRBC: 0 % (ref 0.0–0.2)

## 2023-10-27 LAB — BASIC METABOLIC PANEL WITH GFR
Anion gap: 18 — ABNORMAL HIGH (ref 5–15)
BUN: 76 mg/dL — ABNORMAL HIGH (ref 6–20)
CO2: 20 mmol/L — ABNORMAL LOW (ref 22–32)
Calcium: 8.1 mg/dL — ABNORMAL LOW (ref 8.9–10.3)
Chloride: 93 mmol/L — ABNORMAL LOW (ref 98–111)
Creatinine, Ser: 8.67 mg/dL — ABNORMAL HIGH (ref 0.44–1.00)
GFR, Estimated: 6 mL/min — ABNORMAL LOW (ref >60)
Glucose, Bld: 162 mg/dL — ABNORMAL HIGH (ref 70–99)
Potassium: 5.4 mmol/L — ABNORMAL HIGH (ref 3.5–5.1)
Sodium: 131 mmol/L — ABNORMAL LOW (ref 135–145)

## 2023-10-27 LAB — GLUCOSE, CAPILLARY
Glucose-Capillary: 150 mg/dL — ABNORMAL HIGH (ref 70–99)
Glucose-Capillary: 170 mg/dL — ABNORMAL HIGH (ref 70–99)
Glucose-Capillary: 275 mg/dL — ABNORMAL HIGH (ref 70–99)

## 2023-10-27 MED ORDER — OXYCODONE HCL 5 MG PO TABS
5.0000 mg | ORAL_TABLET | Freq: Four times a day (QID) | ORAL | Status: AC | PRN
  Administered 2023-10-27 (×2): 5 mg via ORAL
  Filled 2023-10-27 (×2): qty 1

## 2023-10-27 MED ORDER — HEPARIN SODIUM (PORCINE) 1000 UNIT/ML IJ SOLN
INTRAMUSCULAR | Status: AC
  Filled 2023-10-27: qty 3

## 2023-10-27 MED ORDER — CARVEDILOL 12.5 MG PO TABS
12.5000 mg | ORAL_TABLET | Freq: Two times a day (BID) | ORAL | Status: DC
  Administered 2023-10-27 – 2023-10-29 (×3): 12.5 mg via ORAL
  Filled 2023-10-27 (×4): qty 1

## 2023-10-27 NOTE — Progress Notes (Signed)
 [facility] Progress Note  Subjective:  Seen up in HD unit  BP's soft, UF goal was lowered  Vitals:   10/27/23 1046 10/27/23 1115 10/27/23 1145 10/27/23 1215  BP: 110/72 106/67 107/89 91/77  Pulse: 66 68 67 67  Resp: 14 14 (!) 21 19  Temp:      TempSrc:      SpO2: 94% 93% 98% 98%  Weight:      Height:        Exam: Gen alert, no distress Sclera anicteric, throat clear  No jvd or bruits Chest clear bilat to bases RRR no MRG Abd soft ntnd no mass or ascites +bs Ext 1-2+ bilat LE  edema Neuro is alert, Ox 3 , nf            OP HD: TTS [facility]  From April 2025 -> 3h  B350  57.5kjg  R AVG  heparin  1600 + 600 u/hr       Assessment/ Plan: DKA/ DM1: started on IV insulin , is now back on SQ.  ESRD: on HD TTS. Last HD was inpt on 6/25. Getting HD today off schedule. Next HD 7/1.  HTN: bp's soft, have lowered coreg  + hold orders written if BP < 125 Volume: + bilat LE edema, no resp issues, on RA, CXR negative. Unable to pull fluid today d/t low BP's Anemia of esrd: Hb 10-11 here, follow.  Bipolar d/o: per coralee Myer Fret MD  [facility] 10/27/2023, 12:47 PM  Recent Labs  Lab 10/23/23 0310 10/23/23 0356 10/25/23 0748 10/25/23 0854 10/25/23 1134 10/26/23 1019 10/27/23 0430 10/27/23 0800  HGB 10.3*   < > 10.0* 11.9*  --   --   --  10.2*  ALBUMIN  2.9*  --  2.9*  --   --   --   --   --   CALCIUM  8.6*  --  8.8*  --   --  8.4* 8.1*  --   PHOS 6.5*  --   --   --   --   --   --   --   CREATININE 8.96*  --  7.64*  --    < > 8.14* 8.67*  --   K 5.2*   < > 5.6* 5.4*  --  4.5 5.4*  --    < > = values in this interval not displayed.   No results for input(s): IRON , TIBC, FERRITIN in the last 168 hours. Inpatient medications:  atorvastatin   80 mg Oral Daily   bumetanide   2 mg Oral Daily   calcitRIOL   1.25 mcg Oral Q T,Th,Sat-1800   carvedilol   12.5 mg Oral BID WC   Chlorhexidine  Gluconate Cloth  6 each Topical Q0600    DULoxetine   20 mg Oral Daily   famotidine   20 mg Oral Daily   heparin   2,000 Units Dialysis Once in dialysis   heparin   5,000 Units Subcutaneous Q8H   insulin  aspart  0-6 Units Subcutaneous TID WC   insulin  glargine-yfgn  6 Units Subcutaneous Daily   lamoTRIgine   200 mg Oral Daily   lipase/protease/amylase  12,000 Units Oral TID WC   methylphenidate  18 mg Oral q morning   OLANZapine  zydis  5 mg Oral QHS   pantoprazole   40 mg Oral BID   sevelamer  carbonate  2,400 mg Oral TID WC   sodium bicarbonate   1,300 mg Oral BID     albuterol , glucagon  (human recombinant), heparin , mouth rinse, oxyCODONE ,  pentafluoroprop-tetrafluoroeth, polyethylene glycol, prochlorperazine 

## 2023-10-27 NOTE — Progress Notes (Addendum)
 PROGRESS NOTE    Ashley Freeman  FMW:[id number] DOB: 1991/03/10 DOA: 10/25/2023 PCP: Keven Crumbly Pap, MD   Brief Narrative: 33 year old with medical history significant for ESRD on dialysis, TTS, heart failure with reduced ejection fraction 30 to 35% in May 2025, diabetes type 1, prolonged QT, bipolar presents with chest pain, shortness of breath, volume overload and DKA.  Patient began with chest pain and worsening shortness of breath after missing hemodialysis again the day prior to admission due to lack of childcare.  Previously needs hemodialysis 6/24.  She was found to be in DKA   Assessment & Plan:   Active Problems:   DKA (diabetic ketoacidosis) (HCC)   1-DKA Diabetes type 1, hyperglycemia uncontrolled --Presents with CBG 800, bicarb 15, 24.  --Bicarb is at 22, gap could be elevated from Renal Failure.  --she has been transition to long acting insulin . Continue with Semglee  and SSI  2-Chest pain, shortness of breath in the setting of volume overload secondary to acute systolic  heart failure and ESRD Missed hemodialysis HD today.   3-Acute heart failure reduced ejection fraction In the setting of ESRD HD today   Acute metabolic encephalopathy Multifactorial. Related to DKA, Hyperglycemia, Hypoglycemia.  She is more awake and alert.   Bipolar disorder: - Continue Olanzapine  , Lamictal  and Cymbalta   Abdominal distension. Ascites  Will order  paracentesis.   Estimated body mass index is 28.98 kg/m as calculated from the following:   Height as of this encounter: 5' 3 (1.6 m).   Weight as of this encounter: 74.2 kg.   DVT prophylaxis: heparin   Code Status: Full code Family Communication: Disposition Plan:  Status is: Inpatient Remains inpatient appropriate because: management of DKA    Consultants:  Nephrology   Procedures:  HD  Antimicrobials:    Subjective: She is complaining of pain, buttock, decline x ray She is feeling  better. She is more awake.   Objective: Vitals:   10/26/23 1900 10/26/23 2000 10/26/23 2313 10/27/23 0621  BP: 116/70  101/63 110/72  Pulse: 72  69 66  Resp: 18  18 19   Temp: 97.6 F (36.4 C)  97.7 F (36.5 C) 97.8 F (36.6 C)  TempSrc: Oral  Oral Oral  SpO2: 92% 98% 100% 98%  Weight:      Height:        Intake/Output Summary (Last 24 hours) at 10/27/2023 0734 Last data filed at 10/26/2023 2002 Gross per 24 hour  Intake 480 ml  Output 0 ml  Net 480 ml   Filed Weights   10/25/23 0750 10/25/23 1645  Weight: 59 kg 74.2 kg    Examination:  General exam: NAD Respiratory system: CTA Cardiovascular system: s 1, S 2 RRR Gastrointestinal system: BS present, soft, nt Central nervous system: Alert.  Extremities: no edema  Data Reviewed: I have personally reviewed following labs and imaging studies  CBC: Recent Labs  Lab 10/20/23 2240 10/23/23 0310 10/23/23 0356 10/25/23 0748 10/25/23 0854  WBC 8.7 11.3*  --  8.3  --   NEUTROABS 4.7 5.9  --  5.7  --   HGB 10.7* 10.3* 11.6* 10.0* 11.9*  HCT 36.1 33.5* 34.0* 32.7* 35.0*  MCV 92.8 89.6  --  89.1  --   PLT 174 239  --  297  --    Basic Metabolic Panel: Recent Labs  Lab 10/20/23 2240 10/23/23 0310 10/23/23 0356 10/25/23 0748 10/25/23 0854 10/25/23 1134 10/26/23 1019  NA 138 136 135 126* 123*  --  132*  K 4.3 5.2* 5.1 5.6* 5.4*  --  4.5  CL 99 97*  --  87*  --   --  92*  CO2 28 20*  --  15*  --   --  22  GLUCOSE 161* 322*  --  821*  --   --  71  BUN 39* 65*  --  55*  --   --  65*  CREATININE 6.70* 8.96*  --  7.64*  --  7.71* 8.14*  CALCIUM  8.6* 8.6*  --  8.8*  --   --  8.4*  PHOS  --  6.5*  --   --   --   --   --    GFR: Estimated Creatinine Clearance: 9.7 mL/min (A) (by C-G formula based on SCr of 8.14 mg/dL (H)). Liver Function Tests: Recent Labs  Lab 10/20/23 2240 10/23/23 0310 10/25/23 0748  AST 61* 58* 23  ALT 39 50* 33  ALKPHOS 628* 637* 495*  BILITOT 0.9 1.1 2.4*  PROT 5.2* 5.5* 5.5*   ALBUMIN  2.6* 2.9* 2.9*   Recent Labs  Lab 10/23/23 0310  LIPASE 19   No results for input(s): AMMONIA in the last 168 hours. Coagulation Profile: No results for input(s): INR, PROTIME in the last 168 hours. Cardiac Enzymes: No results for input(s): CKTOTAL, CKMB, CKMBINDEX, TROPONINI in the last 168 hours. BNP (last 3 results) No results for input(s): PROBNP in the last 8760 hours. HbA1C: No results for input(s): HGBA1C in the last 72 hours. CBG: Recent Labs  Lab 10/26/23 1411 10/26/23 1604 10/26/23 1816 10/26/23 2115 10/27/23 0618  GLUCAP 215* 234* 181* 149* 150*   Lipid Profile: No results for input(s): CHOL, HDL, LDLCALC, TRIG, CHOLHDL, LDLDIRECT in the last 72 hours. Thyroid  Function Tests: No results for input(s): TSH, T4TOTAL, FREET4, T3FREE, THYROIDAB in the last 72 hours. Anemia Panel: No results for input(s): VITAMINB12, FOLATE, FERRITIN, TIBC, IRON , RETICCTPCT in the last 72 hours. Sepsis Labs: No results for input(s): PROCALCITON, LATICACIDVEN in the last 168 hours.  Recent Results (from the past 240 hours)  MRSA Next Gen by PCR, Nasal     Status: None   Collection Time: 10/25/23  4:57 PM   Specimen: Nasal Mucosa; Nasal Swab  Result Value Ref Range Status   MRSA by PCR Next Gen NOT DETECTED NOT DETECTED Final    Comment: (NOTE) The GeneXpert MRSA Assay (FDA approved for NASAL specimens only), is one component of a comprehensive MRSA colonization surveillance program. It is not intended to diagnose MRSA infection nor to guide or monitor treatment for MRSA infections. Test performance is not FDA approved in patients less than 77 years old. Performed at [facility], [address] [place], [place] [address]          Radiology Studies: DG Chest 2 View Result Date: 10/25/2023 CLINICAL DATA:  Chest pain. EXAM: CHEST - 2 VIEW COMPARISON:  10/23/2023. FINDINGS: Low lung volume. Complete  clearing of previously noted pulmonary vascular congestion. Bilateral lung fields are essentially clear converting probable minimal scarring/atelectasis overlying the left lower lung zone, laterally. No acute consolidation or lung collapse. Bilateral costophrenic angles are clear. Stable mildly enlarged cardio-mediastinal silhouette, which is likely accentuated by low lung volume and AP technique. No acute osseous abnormalities. The soft tissues are within normal limits. IMPRESSION: No active cardiopulmonary disease. Electronically Signed   By: Ree Molt M.D.   On: 10/25/2023 08:33        Scheduled Meds:  atorvastatin   80 mg  Oral Daily   bumetanide   2 mg Oral Daily   calcitRIOL   1.25 mcg Oral Q T,Th,Sat-1800   carvedilol   25 mg Oral BID WC   Chlorhexidine  Gluconate Cloth  6 each Topical Q0600   DULoxetine   20 mg Oral Daily   famotidine   20 mg Oral Daily   heparin   2,000 Units Dialysis Once in dialysis   heparin   5,000 Units Subcutaneous Q8H   insulin  aspart  0-6 Units Subcutaneous TID WC   insulin  glargine-yfgn  6 Units Subcutaneous Daily   lamoTRIgine   200 mg Oral Daily   lipase/protease/amylase  12,000 Units Oral TID WC   methylphenidate  18 mg Oral q morning   OLANZapine  zydis  5 mg Oral QHS   pantoprazole   40 mg Oral BID   sevelamer  carbonate  2,400 mg Oral TID WC   sodium bicarbonate   1,300 mg Oral BID   Continuous Infusions:     LOS: 2 days    Time spent: 35 minutes    Earnesteen Birnie A Alla Sloma, MD [facility]   If 7PM-7AM, please contact night-coverage [email or web address]  10/27/2023, 7:34 AM

## 2023-10-27 NOTE — Progress Notes (Signed)
   10/27/23 1426  Vitals  Temp 98 F (36.7 C)  Pulse Rate 72  Resp 17  BP (!) 150/70  SpO2 96 %  O2 Device Room Air  Weight 70.7 kg  Type of Weight Post-Dialysis  Oxygen Therapy  Patient Activity (if Appropriate) In bed  Pulse Oximetry Type Continuous  Oximetry Probe Site Changed No  Post Treatment  Dialyzer Clearance Lightly streaked  Hemodialysis Intake (mL) 0 mL  Liters Processed 66.6  Fluid Removed (mL) 2000 mL  Tolerated HD Treatment Yes  Post-Hemodialysis Comments Pt treatment delayed by bathroom break and arterial pressure issues which caused bfr to be turned down to 337ml/min throughout treatment. Pt completed treatment without issue.  AVG/AVF Arterial Site Held (minutes) 5 minutes  AVG/AVF Venous Site Held (minutes) 5 minutes   Received patient in bed to unit.  Alert and oriented.  Informed consent signed and in chart.   TX duration: Three hours and fifteen minutes  Patient tolerated well.  Transported back to the room  Alert, without acute distress.  Hand-off given to patient's nurse.   Access used: Right upper arm graft Access issues: None

## 2023-10-28 ENCOUNTER — Inpatient Hospital Stay (HOSPITAL_COMMUNITY)

## 2023-10-28 DIAGNOSIS — E1010 Type 1 diabetes mellitus with ketoacidosis without coma: Secondary | ICD-10-CM | POA: Diagnosis not present

## 2023-10-28 LAB — BASIC METABOLIC PANEL WITH GFR
Anion gap: 20 — ABNORMAL HIGH (ref 5–15)
Anion gap: 20 — ABNORMAL HIGH (ref 5–15)
Anion gap: 19 — ABNORMAL HIGH (ref 5–15)
Anion gap: 14 (ref 5–15)
Anion gap: 17 — ABNORMAL HIGH (ref 5–15)
BUN: 49 mg/dL — ABNORMAL HIGH (ref 6–20)
BUN: 48 mg/dL — ABNORMAL HIGH (ref 6–20)
BUN: 50 mg/dL — ABNORMAL HIGH (ref 6–20)
BUN: 52 mg/dL — ABNORMAL HIGH (ref 6–20)
BUN: 54 mg/dL — ABNORMAL HIGH (ref 6–20)
CO2: 19 mmol/L — ABNORMAL LOW (ref 22–32)
CO2: 21 mmol/L — ABNORMAL LOW (ref 22–32)
CO2: 22 mmol/L (ref 22–32)
CO2: 24 mmol/L (ref 22–32)
CO2: 21 mmol/L — ABNORMAL LOW (ref 22–32)
Calcium: 8.4 mg/dL — ABNORMAL LOW (ref 8.9–10.3)
Calcium: 8.5 mg/dL — ABNORMAL LOW (ref 8.9–10.3)
Calcium: 8.6 mg/dL — ABNORMAL LOW (ref 8.9–10.3)
Calcium: 8.5 mg/dL — ABNORMAL LOW (ref 8.9–10.3)
Calcium: 8.3 mg/dL — ABNORMAL LOW (ref 8.9–10.3)
Chloride: 94 mmol/L — ABNORMAL LOW (ref 98–111)
Chloride: 94 mmol/L — ABNORMAL LOW (ref 98–111)
Chloride: 95 mmol/L — ABNORMAL LOW (ref 98–111)
Chloride: 95 mmol/L — ABNORMAL LOW (ref 98–111)
Chloride: 96 mmol/L — ABNORMAL LOW (ref 98–111)
Creatinine, Ser: 6.36 mg/dL — ABNORMAL HIGH (ref 0.44–1.00)
Creatinine, Ser: 6.44 mg/dL — ABNORMAL HIGH (ref 0.44–1.00)
Creatinine, Ser: 6.37 mg/dL — ABNORMAL HIGH (ref 0.44–1.00)
Creatinine, Ser: 6.32 mg/dL — ABNORMAL HIGH (ref 0.44–1.00)
Creatinine, Ser: 6.39 mg/dL — ABNORMAL HIGH (ref 0.44–1.00)
GFR, Estimated: 8 mL/min — ABNORMAL LOW (ref >60)
GFR, Estimated: 8 mL/min — ABNORMAL LOW (ref >60)
GFR, Estimated: 8 mL/min — ABNORMAL LOW (ref >60)
GFR, Estimated: 8 mL/min — ABNORMAL LOW (ref >60)
GFR, Estimated: 8 mL/min — ABNORMAL LOW (ref >60)
Glucose, Bld: 391 mg/dL — ABNORMAL HIGH (ref 70–99)
Glucose, Bld: 252 mg/dL — ABNORMAL HIGH (ref 70–99)
Glucose, Bld: 94 mg/dL (ref 70–99)
Glucose, Bld: 110 mg/dL — ABNORMAL HIGH (ref 70–99)
Glucose, Bld: 170 mg/dL — ABNORMAL HIGH (ref 70–99)
Potassium: 4 mmol/L (ref 3.5–5.1)
Potassium: 4 mmol/L (ref 3.5–5.1)
Potassium: 4.3 mmol/L (ref 3.5–5.1)
Potassium: 4.5 mmol/L (ref 3.5–5.1)
Potassium: 4.4 mmol/L (ref 3.5–5.1)
Sodium: 133 mmol/L — ABNORMAL LOW (ref 135–145)
Sodium: 135 mmol/L (ref 135–145)
Sodium: 136 mmol/L (ref 135–145)
Sodium: 133 mmol/L — ABNORMAL LOW (ref 135–145)
Sodium: 134 mmol/L — ABNORMAL LOW (ref 135–145)

## 2023-10-28 LAB — RENAL FUNCTION PANEL
Albumin: 2.5 g/dL — ABNORMAL LOW (ref 3.5–5.0)
Anion gap: 27 — ABNORMAL HIGH (ref 5–15)
BUN: 48 mg/dL — ABNORMAL HIGH (ref 6–20)
CO2: 11 mmol/L — ABNORMAL LOW (ref 22–32)
Calcium: 8.1 mg/dL — ABNORMAL LOW (ref 8.9–10.3)
Chloride: 96 mmol/L — ABNORMAL LOW (ref 98–111)
Creatinine, Ser: 6.22 mg/dL — ABNORMAL HIGH (ref 0.44–1.00)
GFR, Estimated: 9 mL/min — ABNORMAL LOW (ref >60)
Glucose, Bld: 455 mg/dL — ABNORMAL HIGH (ref 70–99)
Phosphorus: 6.4 mg/dL — ABNORMAL HIGH (ref 2.5–4.6)
Potassium: 4.8 mmol/L (ref 3.5–5.1)
Sodium: 134 mmol/L — ABNORMAL LOW (ref 135–145)

## 2023-10-28 LAB — BETA-HYDROXYBUTYRIC ACID
Beta-Hydroxybutyric Acid: 2.66 mmol/L — ABNORMAL HIGH (ref 0.05–0.27)
Beta-Hydroxybutyric Acid: 1.22 mmol/L — ABNORMAL HIGH (ref 0.05–0.27)
Beta-Hydroxybutyric Acid: 0.2 mmol/L (ref 0.05–0.27)
Beta-Hydroxybutyric Acid: 0.12 mmol/L (ref 0.05–0.27)
Beta-Hydroxybutyric Acid: 0.18 mmol/L (ref 0.05–0.27)

## 2023-10-28 LAB — GLUCOSE, CAPILLARY
Glucose-Capillary: 473 mg/dL — ABNORMAL HIGH (ref 70–99)
Glucose-Capillary: 419 mg/dL — ABNORMAL HIGH (ref 70–99)
Glucose-Capillary: 287 mg/dL — ABNORMAL HIGH (ref 70–99)
Glucose-Capillary: 370 mg/dL — ABNORMAL HIGH (ref 70–99)
Glucose-Capillary: 218 mg/dL — ABNORMAL HIGH (ref 70–99)
Glucose-Capillary: 165 mg/dL — ABNORMAL HIGH (ref 70–99)
Glucose-Capillary: 131 mg/dL — ABNORMAL HIGH (ref 70–99)
Glucose-Capillary: 149 mg/dL — ABNORMAL HIGH (ref 70–99)
Glucose-Capillary: 122 mg/dL — ABNORMAL HIGH (ref 70–99)
Glucose-Capillary: 88 mg/dL (ref 70–99)
Glucose-Capillary: 102 mg/dL — ABNORMAL HIGH (ref 70–99)
Glucose-Capillary: 107 mg/dL — ABNORMAL HIGH (ref 70–99)
Glucose-Capillary: 114 mg/dL — ABNORMAL HIGH (ref 70–99)
Glucose-Capillary: 119 mg/dL — ABNORMAL HIGH (ref 70–99)
Glucose-Capillary: 149 mg/dL — ABNORMAL HIGH (ref 70–99)
Glucose-Capillary: 172 mg/dL — ABNORMAL HIGH (ref 70–99)
Glucose-Capillary: 158 mg/dL — ABNORMAL HIGH (ref 70–99)

## 2023-10-28 LAB — CBC
HCT: 32.7 % — ABNORMAL LOW (ref 36.0–46.0)
Hemoglobin: 10.3 g/dL — ABNORMAL LOW (ref 12.0–15.0)
MCH: 27.5 pg (ref 26.0–34.0)
MCHC: 31.5 g/dL (ref 30.0–36.0)
MCV: 87.4 fL (ref 80.0–100.0)
Platelets: 341 10*3/uL (ref 150–400)
RBC: 3.74 MIL/uL — ABNORMAL LOW (ref 3.87–5.11)
RDW: 15.7 % — ABNORMAL HIGH (ref 11.5–15.5)
WBC: 9.2 10*3/uL (ref 4.0–10.5)
nRBC: 0 % (ref 0.0–0.2)

## 2023-10-28 MED ORDER — OXYCODONE-ACETAMINOPHEN 5-325 MG PO TABS
1.0000 | ORAL_TABLET | Freq: Once | ORAL | Status: AC
  Administered 2023-10-28: 1 via ORAL
  Filled 2023-10-28: qty 1

## 2023-10-28 MED ORDER — LIDOCAINE HCL 1 % IJ SOLN
INTRAMUSCULAR | Status: AC
  Filled 2023-10-28: qty 20

## 2023-10-28 MED ORDER — LIDOCAINE 5 % EX PTCH
1.0000 | MEDICATED_PATCH | CUTANEOUS | Status: DC
  Filled 2023-10-28: qty 1

## 2023-10-28 MED ORDER — INSULIN REGULAR(HUMAN) IN NACL 100-0.9 UT/100ML-% IV SOLN
INTRAVENOUS | Status: DC
  Administered 2023-10-28: 6.5 [IU]/h via INTRAVENOUS
  Filled 2023-10-28: qty 100

## 2023-10-28 MED ORDER — INSULIN GLARGINE-YFGN 100 UNIT/ML ~~LOC~~ SOLN
5.0000 [IU] | Freq: Two times a day (BID) | SUBCUTANEOUS | Status: DC
  Administered 2023-10-28 – 2023-10-29 (×2): 5 [IU] via SUBCUTANEOUS
  Filled 2023-10-28 (×3): qty 0.05

## 2023-10-28 MED ORDER — HYDROMORPHONE HCL 1 MG/ML IJ SOLN
0.5000 mg | Freq: Once | INTRAMUSCULAR | Status: AC
  Administered 2023-10-28: 0.5 mg via INTRAVENOUS
  Filled 2023-10-28: qty 0.5

## 2023-10-28 MED ORDER — DEXTROSE 50 % IV SOLN: 0.0000 mL | INTRAVENOUS | Status: DC | PRN

## 2023-10-28 MED ORDER — DEXTROSE 10 % IV SOLN: INTRAVENOUS | Status: DC

## 2023-10-28 MED ORDER — HYDROXYZINE HCL 10 MG PO TABS
10.0000 mg | ORAL_TABLET | Freq: Once | ORAL | Status: AC
  Administered 2023-10-28: 10 mg via ORAL
  Filled 2023-10-28: qty 1

## 2023-10-28 MED ORDER — DEXTROSE IN LACTATED RINGERS 5 % IV SOLN: INTRAVENOUS | Status: DC

## 2023-10-28 MED ORDER — OXYCODONE HCL 5 MG PO TABS
5.0000 mg | ORAL_TABLET | Freq: Four times a day (QID) | ORAL | Status: DC | PRN
  Administered 2023-10-28 – 2023-10-29 (×2): 5 mg via ORAL
  Filled 2023-10-28 (×2): qty 1

## 2023-10-28 MED ORDER — LACTATED RINGERS IV BOLUS: 20.0000 mL/kg | Freq: Once | INTRAVENOUS | Status: DC

## 2023-10-28 MED ORDER — INSULIN ASPART 100 UNIT/ML IJ SOLN: 0.0000 [IU] | Freq: Every day | INTRAMUSCULAR | Status: DC

## 2023-10-28 MED ORDER — INSULIN ASPART 100 UNIT/ML IJ SOLN
0.0000 [IU] | Freq: Three times a day (TID) | INTRAMUSCULAR | Status: DC
  Administered 2023-10-29: 3 [IU] via SUBCUTANEOUS

## 2023-10-28 NOTE — Progress Notes (Signed)
 PROGRESS NOTE    Ashley Freeman  FMW:[id number] DOB: [birth year] DOA: 10/25/2023 PCP: Keven Crumbly Pap, MD   Brief Narrative: 33 year old with medical history significant for ESRD on dialysis, TTS, heart failure with reduced ejection fraction 30 to 35% in May 2025, diabetes type 1, prolonged QT, bipolar presents with chest pain, shortness of breath, volume overload and DKA.  Patient began with chest pain and worsening shortness of breath after missing hemodialysis again the day prior to admission due to lack of childcare.  Previously needs hemodialysis 6/24.  She was found to be in DKA   Assessment & Plan:   Active Problems:   DKA (diabetic ketoacidosis) (HCC)   1-DKA Diabetes type 1, hyperglycemia uncontrolled --Presents with CBG 800, bicarb 15, 24.  --Bicarb is at 22, gap could be elevated from Renal Failure.  --6/30: Back in DKA, bicarb down to 11, and CBG 400 range.  Resume insulin  gtt. Monitor Bmet. D 5 LR order if CBG below 250  2-Chest pain, shortness of breath in the setting of volume overload secondary to acute systolic  heart failure and ESRD Missed hemodialysis Had HD 6/29  3-Acute heart failure reduced ejection fraction In the setting of ESRD Volume manage with HD  Acute metabolic encephalopathy Multifactorial. Related to DKA, Hyperglycemia, Hypoglycemia.  Awake and alert.   Bipolar disorder: - Continue Olanzapine  , Lamictal  and Cymbalta   Abdominal distension. Ascites  No significant amount of ascites fluid. Paracentesis deferred.    Estimated body mass index is 27.61 kg/m as calculated from the following:   Height as of this encounter: 5' 3 (1.6 m).   Weight as of this encounter: 70.7 kg.   DVT prophylaxis: heparin   Code Status: Full code Family Communication: Disposition Plan:  Status is: Inpatient Remains inpatient appropriate because: management of DKA    Consultants:  Nephrology   Procedures:  HD  Antimicrobials:     Subjective: She is awake, oriented. Report back pain, asking for IV pain meds.    Objective: Vitals:   10/27/23 1600 10/27/23 1927 10/27/23 2305 10/28/23 0340  BP: 135/86 98/73 (!) 119/56 139/69  Pulse:  81 85 80  Resp:  18 18 18   Temp:  98 F (36.7 C) 98.5 F (36.9 C) 98.1 F (36.7 C)  TempSrc:  Oral Oral Oral  SpO2:  97% 96% 97%  Weight:      Height:        Intake/Output Summary (Last 24 hours) at 10/28/2023 0707 Last data filed at 10/28/2023 0300 Gross per 24 hour  Intake 360 ml  Output 2000 ml  Net -1640 ml   Filed Weights   10/25/23 1645 10/27/23 0945 10/27/23 1426  Weight: 74.2 kg 72.7 kg 70.7 kg    Examination:  General exam: NAD Respiratory system: CTA Cardiovascular system: S1, S 2 RRR Gastrointestinal system: BS present, soft, NT Central nervous system: Alert  Extremities: no edema  Data Reviewed: I have personally reviewed following labs and imaging studies  CBC: Recent Labs  Lab 10/23/23 0310 10/23/23 0356 10/25/23 0748 10/25/23 0854 10/27/23 0800  WBC 11.3*  --  8.3  --  9.2  NEUTROABS 5.9  --  5.7  --   --   HGB 10.3* 11.6* 10.0* 11.9* 10.2*  HCT 33.5* 34.0* 32.7* 35.0* 31.7*  MCV 89.6  --  89.1  --  85.4  PLT 239  --  297  --  336   Basic Metabolic Panel: Recent Labs  Lab 10/23/23 0310 10/23/23 0356 10/25/23 0748 10/25/23  9145 10/25/23 1134 10/26/23 1019 10/27/23 0430 10/28/23 0214  NA 136   < > 126* 123*  --  132* 131* 134*  K 5.2*   < > 5.6* 5.4*  --  4.5 5.4* 4.8  CL 97*  --  87*  --   --  92* 93* 96*  CO2 20*  --  15*  --   --  22 20* 11*  GLUCOSE 322*  --  821*  --   --  71 162* 455*  BUN 65*  --  55*  --   --  65* 76* 48*  CREATININE 8.96*  --  7.64*  --  7.71* 8.14* 8.67* 6.22*  CALCIUM  8.6*  --  8.8*  --   --  8.4* 8.1* 8.1*  PHOS 6.5*  --   --   --   --   --   --  6.4*   < > = values in this interval not displayed.   GFR: Estimated Creatinine Clearance: 12.4 mL/min (A) (by C-G formula based on SCr of 6.22  mg/dL (H)). Liver Function Tests: Recent Labs  Lab 10/23/23 0310 10/25/23 0748 10/28/23 0214  AST 58* 23  --   ALT 50* 33  --   ALKPHOS 637* 495*  --   BILITOT 1.1 2.4*  --   PROT 5.5* 5.5*  --   ALBUMIN  2.9* 2.9* 2.5*   Recent Labs  Lab 10/23/23 0310  LIPASE 19   No results for input(s): AMMONIA in the last 168 hours. Coagulation Profile: No results for input(s): INR, PROTIME in the last 168 hours. Cardiac Enzymes: No results for input(s): CKTOTAL, CKMB, CKMBINDEX, TROPONINI in the last 168 hours. BNP (last 3 results) No results for input(s): PROBNP in the last 8760 hours. HbA1C: No results for input(s): HGBA1C in the last 72 hours. CBG: Recent Labs  Lab 10/26/23 2115 10/27/23 0618 10/27/23 1627 10/27/23 2105 10/28/23 0621  GLUCAP 149* 150* 170* 275* 473*   Lipid Profile: No results for input(s): CHOL, HDL, LDLCALC, TRIG, CHOLHDL, LDLDIRECT in the last 72 hours. Thyroid  Function Tests: No results for input(s): TSH, T4TOTAL, FREET4, T3FREE, THYROIDAB in the last 72 hours. Anemia Panel: No results for input(s): VITAMINB12, FOLATE, FERRITIN, TIBC, IRON , RETICCTPCT in the last 72 hours. Sepsis Labs: No results for input(s): PROCALCITON, LATICACIDVEN in the last 168 hours.  Recent Results (from the past 240 hours)  MRSA Next Gen by PCR, Nasal     Status: None   Collection Time: 10/25/23  4:57 PM   Specimen: Nasal Mucosa; Nasal Swab  Result Value Ref Range Status   MRSA by PCR Next Gen NOT DETECTED NOT DETECTED Final    Comment: (NOTE) The GeneXpert MRSA Assay (FDA approved for NASAL specimens only), is one component of a comprehensive MRSA colonization surveillance program. It is not intended to diagnose MRSA infection nor to guide or monitor treatment for MRSA infections. Test performance is not FDA approved in patients less than 28 years old. Performed at [facility], [address],  [place], [place] [address]          Radiology Studies: No results found.       Scheduled Meds:  atorvastatin   80 mg Oral Daily   bumetanide   2 mg Oral Daily   calcitRIOL   1.25 mcg Oral Q T,Th,Sat-1800   carvedilol   12.5 mg Oral BID WC   Chlorhexidine  Gluconate Cloth  6 each Topical Q0600   DULoxetine   20 mg Oral Daily  famotidine   20 mg Oral Daily   heparin   5,000 Units Subcutaneous Q8H   lamoTRIgine   200 mg Oral Daily   lipase/protease/amylase  12,000 Units Oral TID WC   methylphenidate  18 mg Oral q morning   OLANZapine  zydis  5 mg Oral QHS   pantoprazole   40 mg Oral BID   sevelamer  carbonate  2,400 mg Oral TID WC   sodium bicarbonate   1,300 mg Oral BID   Continuous Infusions:  dextrose  5% lactated ringers      insulin         LOS: 3 days    Time spent: 35 minutes    Aiesha Leland A Gunnison Chahal, MD [facility]   If 7PM-7AM, please contact night-coverage [email or web address]  10/28/2023, 7:07 AM

## 2023-10-28 NOTE — Inpatient Diabetes Management (Addendum)
 Inpatient Diabetes Program Recommendations  AACE/ADA: New Consensus Statement on Inpatient Glycemic Control (2015)  Target Ranges:  Prepandial:   less than 140 mg/dL      Peak postprandial:   less than 180 mg/dL (1-2 hours)      Critically ill patients:  140 - 180 mg/dL   Lab Results  Component Value Date   GLUCAP 287 (H) 10/28/2023   HGBA1C 10.3 (H) 08/27/2023    Review of Glycemic Control  Latest Reference Range & Units 10/27/23 21:05 10/28/23 06:21 10/28/23 07:53 10/28/23 08:48 10/28/23 09:16  Glucose-Capillary 70 - 99 mg/dL 724 (H) 526 (H) 580 (H) 370 (H) 287 (H)   Diabetes history: DM 1 Outpatient Diabetes medications:  Dexcom G7 Lantus  10 units q AM Novolog  5 units tid with meals Current orders for Inpatient glycemic control:  IV insulin  (goal 140-180 mg/dL)  Inpatient Diabetes Program Recommendations:    When patient is ready to transition back off insulin  drip, consider Semglee  5 units bid,  2 hours prior to d/c of insulin  drip. Also consider Novolog  0-6 units q 4 hours (to ensure blood sugars are checked and being covered throughout night).  Unclear what precipitated increased CBG's overnight.   ** Patient has had 9 admits in 2025 so far and has been seen multiple times by DM coordinator.  Unsure how to help patient  with her DM.  Needs close follow-up with PCP and endocrinology.   Thanks,  Randall Bullocks, RN, BC-ADM Inpatient Diabetes Coordinator Pager [phone]  (8a-5p)

## 2023-10-28 NOTE — Plan of Care (Signed)
  Problem: Nutrition: Goal: Adequate nutrition will be maintained Outcome: Progressing   Problem: Coping: Goal: Level of anxiety will decrease Outcome: Progressing   Problem: Elimination: Goal: Will not experience complications related to bowel motility Outcome: Progressing Goal: Will not experience complications related to urinary retention Outcome: Progressing   Problem: Safety: Goal: Ability to remain free from injury will improve Outcome: Progressing   Problem: Skin Integrity: Goal: Risk for impaired skin integrity will decrease Outcome: Progressing

## 2023-10-28 NOTE — Plan of Care (Signed)

## 2023-10-28 NOTE — Progress Notes (Signed)
 [facility] Progress Note  Subjective:  Patient states she is having some back pain as well as cough.  No shortness of breath.  Anion gap widened now back on IV insulin   Vitals:   10/27/23 1927 10/27/23 2305 10/28/23 0340 10/28/23 0735  BP: 98/73 (!) 119/56 139/69 (!) 156/76  Pulse: 81 85 80 82  Resp: 18 18 18 18   Temp: 98 F (36.7 C) 98.5 F (36.9 C) 98.1 F (36.7 C) 98 F (36.7 C)  TempSrc: Oral Oral Oral Oral  SpO2: 97% 96% 97%   Weight:      Height:        Exam: Gen alert, no distress Sclera anicteric, moist mucous membranes No jvd or bruits Bilateral chest rise with no increased work of breathing Normal rate, no rub Abd soft and nontender Ext 1+ bilat LE  edema Neuro is alert, Ox 3 , nf            OP HD: TTS [facility]  From April 2025 -> 3h  B350  57.5kjg  R AVG  heparin  1600 + 600 u/hr       Assessment/ Plan: DKA/ DM1: Persistent hyperglycemia now back on IV insulin , management per primary team ESRD: on HD TTS.  Received dialysis off schedule on 6/29 HTN: bp's soft, have lowered coreg  + hold orders written if BP < 125 Volume: + bilat LE edema, no resp issues, on RA, CXR negative.  Ultrafiltration limited by low blood pressures at time Anemia of esrd: Hb 10-11 here, follow.  Bipolar d/o: per pmd     Myer Fret MD  [facility] 10/28/2023, 10:30 AM  Recent Labs  Lab 10/23/23 0310 10/23/23 0356 10/25/23 0748 10/25/23 0854 10/25/23 1134 10/27/23 0800 10/28/23 0214 10/28/23 0819  HGB 10.3*   < > 10.0* 11.9*  --  10.2*  --   --   ALBUMIN  2.9*  --  2.9*  --   --   --  2.5*  --   CALCIUM  8.6*  --  8.8*  --    < >  --  8.1* 8.4*  PHOS 6.5*  --   --   --   --   --  6.4*  --   CREATININE 8.96*  --  7.64*  --    < >  --  6.22* 6.36*  K 5.2*   < > 5.6* 5.4*   < >  --  4.8 4.0   < > = values in this interval not displayed.   No results for input(s): IRON , TIBC, FERRITIN in the last 168 hours. Inpatient  medications:  atorvastatin   80 mg Oral Daily   bumetanide   2 mg Oral Daily   calcitRIOL   1.25 mcg Oral Q T,Th,Sat-1800   carvedilol   12.5 mg Oral BID WC   Chlorhexidine  Gluconate Cloth  6 each Topical Q0600   DULoxetine   20 mg Oral Daily   famotidine   20 mg Oral Daily   heparin   5,000 Units Subcutaneous Q8H    HYDROmorphone  (DILAUDID ) injection  0.5 mg Intravenous Once   hydrOXYzine   10 mg Oral Once   lamoTRIgine   200 mg Oral Daily   lidocaine   1 patch Transdermal Q24H   lipase/protease/amylase  12,000 Units Oral TID WC   methylphenidate  18 mg Oral q morning   OLANZapine  zydis  5 mg Oral QHS   pantoprazole   40 mg Oral BID   sevelamer  carbonate  2,400 mg Oral TID WC   sodium  bicarbonate  1,300 mg Oral BID    dextrose  5% lactated ringers      insulin  1.9 Units/hr (10/28/23 1018)    albuterol , dextrose , mouth rinse, oxyCODONE , polyethylene glycol, prochlorperazine 

## 2023-10-28 NOTE — Procedures (Signed)
 Patient presents for therapeutic paracentesis. I was present during limited US  of the abdomen to evaluate for ascites. There is a small amount peritoneal fluid with numerous mobile loops of bowel. There is a very small pocket of fluid in which to perform paracentesis. Patient states that she does not feel distended or uncomfortable; states she was surprised she was sent for a paracentesis.   Discussed the risks versus the benefits of the procedure given the small pocket of fluid. Patient states that she is supposed to go to dialysis tomorrow and requests that we do not move forward with the procedure at this time as she believes dialysis will help with any residual fluid she may have. Procedure not performed.    Electronically Signed: Zoua Caporaso M Kamoni Depree, PA-C 10/28/2023, 10:59 AM

## 2023-10-28 NOTE — TOC Initial Note (Signed)
 Transition of Care [facility]) - Initial/Assessment Note    Patient Details  Name: Ashley Freeman MRN: [id number] Date of Birth: 09-15-1990  Transition [facility] CM/SW Contact:    Roxie KANDICE Stain, RN Phone Number: 10/28/2023, 2:26 PM  Clinical Narrative:                  Spoke to patient regarding transition needs.  Patient lives with mother and uses medicaid transportation. Aunt will transport home at discharge. PCP confirmed. Patient is requesting to work with PT, OT, Notified MD.  [facility] will continue to follow. Expected Discharge Plan: Home/Self Care Barriers to Discharge: Continued Medical Work up   Patient Goals and CMS Choice Patient states their goals for this hospitalization and ongoing recovery are:: Return home          Expected Discharge Plan and Services       Living arrangements for the past 2 months: Apartment                                      Prior Living Arrangements/Services Living arrangements for the past 2 months: Apartment Lives with:: Parents Patient language and need for interpreter reviewed:: Yes Do you feel safe going back to the place where you live?: Yes      Need for Family Participation in Patient Care: Yes (Comment) Care giver support system in place?: Yes (comment)   Criminal Activity/Legal Involvement Pertinent to Current Situation/Hospitalization: No - Comment as needed  Activities of Daily Living   ADL Screening (condition at time of admission) Independently performs ADLs?: Yes (appropriate for developmental age) Is the patient deaf or have difficulty hearing?: No Does the patient have difficulty seeing, even when wearing glasses/contacts?: No Does the patient have difficulty concentrating, remembering, or making decisions?: No  Permission Sought/Granted                  Emotional Assessment Appearance:: Appears stated age Attitude/Demeanor/Rapport: Engaged Affect (typically observed):  Accepting Orientation: : Oriented to Self, Oriented to Place, Oriented to  Time, Oriented to Situation Alcohol / Substance Use: Not Applicable Psych Involvement: No (comment)  Admission diagnosis:  DKA (diabetic ketoacidosis) (HCC) [E11.10] Type 1 diabetes mellitus with ketoacidosis without coma (HCC) [E10.10] Patient Active Problem List   Diagnosis Date Noted   Acute on chronic diastolic heart failure (HCC) 10/12/2023   Generalized abdominal pain 08/27/2023   Elevated LFTs 07/20/2023   Cholecystitis 06/30/2023   Prolonged QT interval 06/30/2023   Bipolar disorder (HCC) 06/30/2023   Hyperglycemic crisis due to Type 1 diabetes mellitus (HCC) 05/20/2023   HFrEF (heart failure with reduced ejection fraction) (HCC) 05/20/2023   Chronic combined systolic and diastolic heart failure (HCC) 05/20/2023   Generalized pain 05/20/2023   Hypoglycemia 09/08/2022   Dilated cardiomyopathy (HCC) 09/08/2022   Seizures (HCC) 09/08/2022   ESRD on hemodialysis (HCC) 09/08/2022   Altered mental status 09/07/2022   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 difficile diarrhea 11/15/2021   SIRS (systemic inflammatory response syndrome) (HCC) 11/06/2021   LGSIL  on Pap smear of cervix 09/24/2021   Stage 3b chronic kidney disease (HCC) 08/14/2021   DKA (diabetic ketoacidosis) (HCC) 07/26/2021   Essential hypertension 07/26/2021   Acute renal failure superimposed on stage 3b chronic kidney disease (HCC) 06/20/2021   N&V (nausea and vomiting) 06/20/2021   AKI (acute kidney injury) (HCC) 06/20/2021   Type 1 diabetes mellitus with chronic kidney disease on  chronic dialysis (HCC) 05/26/2021   Dyslipidemia 05/26/2021   Anasarca 04/16/2021   Mild protein malnutrition (HCC) 03/07/2021   DKA, type 1 (HCC) 12/08/2020   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 diabetes mellitus (HCC) 09/17/2020   Type 1 diabetes mellitus (HCC) 02/08/2020   Systolic ejection murmur 08/03/2019   Type 1 diabetes mellitus with hyperglycemia (HCC) 07/24/2019   Diabetic retinopathy (HCC) 07/02/2019   Proteinuria due to type 1 diabetes mellitus (HCC) 05/21/2019   Diarrhea 05/31/2016   Diabetic neuropathy (HCC) 01/16/2016   Preop cardiovascular exam 08/24/2013   Pseudohyponatremia 08/04/2012   Hypokalemia 05/07/2012   Lactic acidosis 02/19/2012   Leukocytosis 02/19/2012   Normocytic anemia 02/19/2012   Hyperkalemia 02/19/2012   Stable proliferative diabetic retinopathy associated with type 1 diabetes mellitus (HCC) 10/13/1997   PCP:  Keven Crumbly Pap, MD Pharmacy:   [facility] [place]),  - [address] [place]) [place] [address] Phone: [phone] Fax: [phone]     Social Drivers of Health (SDOH) Social History: SDOH Screenings   Food Insecurity: No Food Insecurity (10/25/2023)  Housing: Low Risk  (10/25/2023)  Transportation Needs: Unmet Transportation Needs (10/25/2023)  Utilities: Not At Risk (10/25/2023)  Depression (PHQ2-9): Medium Risk (03/02/2020)  Financial Resource Strain: Low Risk  (09/18/2023)   Received from [facility]  Physical Activity: Insufficiently Active (03/02/2022)   Received from [facility]  Social Connections: Moderately Integrated (08/28/2023)  Stress: No Stress Concern Present (03/08/2023)   Received from [facility]  Tobacco Use: Low Risk  (10/25/2023)   SDOH Interventions:     Readmission Risk Interventions    07/23/2023    4:25 PM 07/03/2023   10:14 AM 05/21/2023   11:04 AM  Readmission Risk Prevention Plan  Transportation Screening Complete Complete Complete  PCP  or Specialist Appt within 3-5 Days   Complete  Social Work Consult for Recovery Care Planning/Counseling   Complete  Palliative Care Screening   Not Applicable  Medication Review [employer]) Referral to Pharmacy Complete Complete  PCP or Specialist appointment within 3-5 days of discharge Complete Complete   HRI or Home Care Consult Complete Complete   SW Recovery Care/Counseling Consult Complete Complete   Palliative Care Screening Not Applicable Not Applicable   Skilled Nursing Facility Not Applicable Not Applicable

## 2023-10-29 DIAGNOSIS — E1010 Type 1 diabetes mellitus with ketoacidosis without coma: Secondary | ICD-10-CM | POA: Diagnosis not present

## 2023-10-29 LAB — BASIC METABOLIC PANEL WITH GFR
Anion gap: 14 (ref 5–15)
BUN: 55 mg/dL — ABNORMAL HIGH (ref 6–20)
CO2: 21 mmol/L — ABNORMAL LOW (ref 22–32)
Calcium: 8.1 mg/dL — ABNORMAL LOW (ref 8.9–10.3)
Chloride: 96 mmol/L — ABNORMAL LOW (ref 98–111)
Creatinine, Ser: 6.68 mg/dL — ABNORMAL HIGH (ref 0.44–1.00)
GFR, Estimated: 8 mL/min — ABNORMAL LOW (ref >60)
Glucose, Bld: 204 mg/dL — ABNORMAL HIGH (ref 70–99)
Potassium: 4.8 mmol/L (ref 3.5–5.1)
Sodium: 131 mmol/L — ABNORMAL LOW (ref 135–145)

## 2023-10-29 LAB — GLUCOSE, CAPILLARY: Glucose-Capillary: 207 mg/dL — ABNORMAL HIGH (ref 70–99)

## 2023-10-29 LAB — BETA-HYDROXYBUTYRIC ACID: Beta-Hydroxybutyric Acid: 0.79 mmol/L — ABNORMAL HIGH (ref 0.05–0.27)

## 2023-10-29 MED ORDER — INSULIN GLARGINE-YFGN 100 UNIT/ML ~~LOC~~ SOLN
5.0000 [IU] | Freq: Once | SUBCUTANEOUS | Status: AC
  Administered 2023-10-29: 5 [IU] via SUBCUTANEOUS
  Filled 2023-10-29: qty 0.05

## 2023-10-29 MED ORDER — INSULIN GLARGINE-YFGN 100 UNIT/ML ~~LOC~~ SOPN
10.0000 [IU] | PEN_INJECTOR | Freq: Every day | SUBCUTANEOUS | Status: DC
  Filled 2023-10-29: qty 3

## 2023-10-29 MED ORDER — CARVEDILOL 12.5 MG PO TABS: 12.5000 mg | ORAL_TABLET | Freq: Two times a day (BID) | ORAL | 0 refills | Status: DC

## 2023-10-29 MED ORDER — LAMOTRIGINE 200 MG PO TABS: 200.0000 mg | ORAL_TABLET | Freq: Every day | ORAL | 0 refills | Status: DC

## 2023-10-29 MED ORDER — HEPARIN SODIUM (PORCINE) 1000 UNIT/ML IJ SOLN: 2000.0000 [IU] | Freq: Once | INTRAMUSCULAR | Status: DC

## 2023-10-29 MED ORDER — HEPARIN SODIUM (PORCINE) 1000 UNIT/ML IJ SOLN: 1000.0000 [IU] | Freq: Once | INTRAMUSCULAR | Status: DC

## 2023-10-29 NOTE — Plan of Care (Signed)
  Problem: Activity: Goal: Risk for activity intolerance will decrease Outcome: Progressing   Problem: Nutrition: Goal: Adequate nutrition will be maintained Outcome: Progressing   Problem: Coping: Goal: Level of anxiety will decrease Outcome: Progressing   Problem: Elimination: Goal: Will not experience complications related to bowel motility Outcome: Progressing Goal: Will not experience complications related to urinary retention Outcome: Progressing   Problem: Pain Managment: Goal: General experience of comfort will improve and/or be controlled Outcome: Progressing   Problem: Safety: Goal: Ability to remain free from injury will improve Outcome: Progressing   Problem: Skin Integrity: Goal: Risk for impaired skin integrity will decrease Outcome: Progressing

## 2023-10-29 NOTE — Plan of Care (Signed)
  Problem: Education: Goal: Knowledge of General Education information will improve Description: Including pain rating scale, medication(s)/side effects and non-pharmacologic comfort measures Outcome: Progressing   Problem: Pain Managment: Goal: General experience of comfort will improve and/or be controlled Outcome: Progressing   Problem: Coping: Goal: Ability to adjust to condition or change in health will improve Outcome: Progressing   Problem: Metabolic: Goal: Ability to maintain appropriate glucose levels will improve Outcome: Progressing

## 2023-10-29 NOTE — Progress Notes (Signed)
   10/29/23 1000  PT Recommendation  Follow Up Recommendations No PT follow up  Patient can return home with the following Assistance with cooking/housework;Assist for transportation  Functional Status Assessment Patient has had a recent decline in their functional status and demonstrates the ability to make significant improvements in function in a reasonable and predictable amount of time.  PT equipment Rollator (4 wheels)   Full note to follow.  Reached out to CM to order rollator for pt prior to d/c home.  Runette Scifres M,PT Acute Rehab Services [phone]

## 2023-10-29 NOTE — Progress Notes (Addendum)
 Discharge instructions reviewed with pt.  Copy of instructions given to pt. Pt informed her script(s) were sent to her pharmacy for pick up, pt informed her coreg  was a new dose.  PT (therapy) in with pt at this time and walking pt in hallway.   PT is recommending a rollator walker for pt, she is reaching out to unit CM to have it ordered. Once this has been ordered, pt will be able to go down to the discharge lounge while she waits for her rollator and for her ride to pick her up.  Once the above is done pt will be d/c'd via wheelchair with belongings and will be escorted by staff to the discharge lounge.   Aubriel Khanna,RN SWOT   At 1018--- pt to go to her outpt dialysis treatment today at 12N. Pt states her mother has ordered her an uber/lift transportation to home then she will go to her dialysis.  Will speak with CM about having the rollator delivered to the pt's home, pt aware and has her cell phone with her if CM/DME company needs to speak with her further.    Yvette Roark,RN SWOT   At 1035--pt's lift here At 1037--spoke with CM, she has contacted [employer] and they will have to rollator delivered/shipped to the pt's home.    Saragrace Selke,RN SWOT

## 2023-10-29 NOTE — Progress Notes (Addendum)
 Notified by attending and nephrologist regarding plans for pt to d/c this am and pt go to out-pt clinic today on 2nd shift (pt's normal time). Contacted [employer] and spoke to [employer]. Clinic can treat pt today at normal time. Update provided to providers. Will fax d/c summary to clinic for continuation of care once completed.   Randine Mungo Renal Navigator [phone]  Addendum at 11:01 am: D/C summary and today's renal note faxed to clinic for continuation of care.

## 2023-10-29 NOTE — Discharge Summary (Signed)
 Physician Discharge Summary   Patient: Ashley Freeman MRN: [id number] DOB: 01-12-1991  Admit date:     10/25/2023  Discharge date: 10/29/23  Discharge Physician: Owen DELENA Lore   PCP: [name], [name], MD   Recommendations at discharge:   Needs further adjustment of insulin  regimen.  Needs further volume management removal with HD  Discharge Diagnoses: Active Problems:   DKA (diabetic ketoacidosis) (HCC)  Resolved Problems:   * No resolved hospital problems. [facility] Course: 33 year old with medical history significant for ESRD on dialysis, TTS, heart failure with reduced ejection fraction 30 to 35% in May 2025, diabetes type 1, prolonged QT, bipolar presents with chest pain, shortness of breath, volume overload and DKA.   Patient began with chest pain and worsening shortness of breath after missing hemodialysis again the day prior to admission due to lack of childcare.  Previously needs hemodialysis 6/24.   She was found to be in DKA    Assessment and Plan: 1-DKA Diabetes type 1, hyperglycemia uncontrolled --Presents with CBG 800, bicarb 15, 24.  --Bicarb is at 22, gap could be elevated from Renal Failure.  --6/30: Back in DKA, bicarb down to 11, and CBG 400 range.  -was able to be wean off insulin  gtt, back to semglee  daily Stable for discharge  2-Chest pain, shortness of breath in the setting of volume overload secondary to acute systolic  heart failure and ESRD Missed hemodialysis Had HD 6/29 Resolved.  She will follow up with her HD center today.   3-Acute heart failure reduced ejection fraction In the setting of ESRD Volume manage with HD   Acute metabolic encephalopathy Multifactorial. Related to DKA, Hyperglycemia, Hypoglycemia.  Resolved, back to baseline.    Bipolar disorder: - Continue Olanzapine  , Lamictal  and Cymbalta    Abdominal distension. Ascites  No significant amount of ascites fluid. Paracentesis deferred.            Consultants: Nephrology  Procedures performed: HD Disposition: Home Diet recommendation:  Discharge Diet Orders (From admission, onward)     Start     Ordered   10/29/23 0000  Diet Carb Modified        10/29/23 0851           Carb modified diet DISCHARGE MEDICATION: Allergies as of 10/29/2023       Reactions   Cephalexin  Anaphylaxis, Other (See Comments)   Has gotten ceftriaxone  in the past    Morphine  Itching   Penicillins Hives, Rash   Fish Allergy Other (See Comments)   Allergic   Benadryl  [diphenhydramine ] Itching   Dilaudid  [hydromorphone ] Itching   Doxycycline  Itching   Methotrexate Derivatives Rash   Oxycodone  Itching        Medication List     STOP taking these medications    Entresto 24-26 MG Generic drug: sacubitril-valsartan   methocarbamol  500 MG tablet Commonly known as: ROBAXIN        TAKE these medications    albuterol  108 (90 Base) MCG/ACT inhaler Commonly known as: VENTOLIN  HFA Inhale 2 puffs into the lungs every 4 (four) hours as needed for wheezing or shortness of breath.   atorvastatin  80 MG tablet Commonly known as: LIPITOR Take 1 tablet (80 mg total) by mouth daily.   bumetanide  2 MG tablet Commonly known as: BUMEX  Take 2 mg by mouth daily as needed (for fluid).   calcitRIOL  0.25 MCG capsule Commonly known as: ROCALTROL  Take 5 capsules (1.25 mcg total) by mouth every Tuesday, Thursday, and Saturday at 6 PM.  carvedilol  12.5 MG tablet Commonly known as: COREG  Take 1 tablet (12.5 mg total) by mouth 2 (two) times daily with a meal. What changed:  medication strength how much to take   cyclobenzaprine  5 MG tablet Commonly known as: FLEXERIL  Take 1 tablet (5 mg total) by mouth 3 (three) times daily as needed for muscle spasms.   Dexcom G7 Sensor Misc Change sensors every 10 days What changed:  how much to take how to take this when to take this additional instructions   dicyclomine  20 MG tablet Commonly  known as: BENTYL  Take 20 mg by mouth 2 (two) times daily as needed for spasms.   DULoxetine  20 MG capsule Commonly known as: CYMBALTA  Take 20 mg by mouth 2 (two) times daily.   famotidine  20 MG tablet Commonly known as: PEPCID  Take 20 mg by mouth daily before breakfast.   insulin  aspart 100 UNIT/ML injection Commonly known as: novoLOG  Inject 5 Units into the skin 3 (three) times daily with meals.   lamoTRIgine  200 MG tablet Commonly known as: LAMICTAL  Take 1 tablet (200 mg total) by mouth daily.   Lantus  SoloStar 100 UNIT/ML Solostar Pen Generic drug: insulin  glargine Inject 10 Units into the skin in the morning.   losartan  100 MG tablet Commonly known as: COZAAR  Take 150 mg by mouth daily.   methylphenidate 18 MG CR tablet Commonly known as: CONCERTA Take 18 mg by mouth every morning.   metoCLOPramide  10 MG tablet Commonly known as: REGLAN  Take 1 tablet (10 mg total) by mouth every 8 (eight) hours as needed for nausea.   OLANZapine  zydis 5 MG disintegrating tablet Commonly known as: ZYPREXA  Take 5 mg by mouth at bedtime.   ondansetron  4 MG disintegrating tablet Commonly known as: ZOFRAN -ODT Take 1 tablet (4 mg total) by mouth every 8 (eight) hours as needed for nausea or vomiting.   Pancrelipase  (Lip-Prot-Amyl) 3000-9500 units Cpep Take 3,000 units of lipase by mouth 3 (three) times daily.   prochlorperazine  5 MG tablet Commonly known as: COMPAZINE  Take 5 mg by mouth every 6 (six) hours as needed for nausea.   sevelamer  carbonate 800 MG tablet Commonly known as: RENVELA  Take 2,400 mg by mouth 3 (three) times daily with meals.   sodium bicarbonate  650 MG tablet Take 1,300 mg by mouth 2 (two) times daily.   SUMAtriptan  50 MG tablet Commonly known as: IMITREX  Take 50 mg by mouth every 2 (two) hours as needed for migraine or headache.        Discharge Exam: Filed Weights   10/25/23 1645 10/27/23 0945 10/27/23 1426  Weight: 74.2 kg 72.7 kg 70.7 kg    General; NAD  Condition at discharge: stable.   The results of significant diagnostics from this hospitalization (including imaging, microbiology, ancillary and laboratory) are listed below for reference.   Imaging Studies: IR ABDOMEN US  LIMITED Result Date: 10/28/2023 CLINICAL DATA:  33 year old female with a history of type 1 diabetes, end-stage renal disease on dialysis, hemorrhage current ascites. Request for therapeutic paracentesis. EXAM: ULTRASOUND ABDOMEN LIMITED COMPARISON:  None FINDINGS: Imaging of all 4 quadrants of the abdomen reveals a small amount of ascites. IMPRESSION: Small amount of ascites seen on ultrasound. After discussion of the risks versus benefits of the procedure the patient decided to defer paracentesis at this time. Performed by: Sherrilee Bal, PA-C under the supervision of Dr. KANDICE Moan Electronically Signed   By: Marcey Moan M.D.   On: 10/28/2023 11:35   DG Chest 2 View Result Date: 10/25/2023  CLINICAL DATA:  Chest pain. EXAM: CHEST - 2 VIEW COMPARISON:  10/23/2023. FINDINGS: Low lung volume. Complete clearing of previously noted pulmonary vascular congestion. Bilateral lung fields are essentially clear converting probable minimal scarring/atelectasis overlying the left lower lung zone, laterally. No acute consolidation or lung collapse. Bilateral costophrenic angles are clear. Stable mildly enlarged cardio-mediastinal silhouette, which is likely accentuated by low lung volume and AP technique. No acute osseous abnormalities. The soft tissues are within normal limits. IMPRESSION: No active cardiopulmonary disease. Electronically Signed   By: Ree Molt M.D.   On: 10/25/2023 08:33   DG Chest 2 View Result Date: 10/23/2023 CLINICAL DATA:  Dyspnea, volume overload EXAM: CHEST - 2 VIEW COMPARISON:  10/18/2023 FINDINGS: Lung volumes are small, but are symmetric and are stable since prior examination. Progressive mild perihilar interstitial pulmonary infiltrate  has developed in keeping with mild progressive interstitial pulmonary edema. No pneumothorax or pleural effusion. Cardiac size is mildly enlarged. No acute bone. IMPRESSION: 1. Pulmonary hypoinflation. 2. Mild progressive interstitial pulmonary edema, possibly cardiogenic. Electronically Signed   By: Dorethia Molt M.D.   On: 10/23/2023 02:41   DG Chest Port 1 View Result Date: 10/20/2023 CLINICAL DATA:  Altered mental status EXAM: PORTABLE CHEST 1 VIEW COMPARISON:  Chest x-ray 10/19/2023 FINDINGS: The heart is enlarged, unchanged. The lungs are clear. There is no pleural effusion or pneumothorax. No acute fractures are seen a IMPRESSION: No active disease. Electronically Signed   By: Greig Pique M.D.   On: 10/20/2023 23:32   DG Chest Port 1 View Result Date: 10/19/2023 CLINICAL DATA:  Shortness of breath EXAM: PORTABLE CHEST 1 VIEW COMPARISON:  Chest x-ray performed October 12, 2023 FINDINGS: Low lung volumes. Enlarged heart with interstitial airspace opacities. Suspected small pleural effusions. IMPRESSION: 1. Enlarged heart with low lung volumes. 2. Interstitial edema with suspected small pleural effusions. Electronically Signed   By: Maude Naegeli M.D.   On: 10/19/2023 06:49   US  Paracentesis Result Date: 10/14/2023 INDICATION: Ascites EXAM: ULTRASOUND GUIDED  PARACENTESIS MEDICATIONS: None. COMPLICATIONS: None immediate. PROCEDURE: Informed written consent was obtained from the patient after a discussion of the risks, benefits and alternatives to treatment. A timeout was performed prior to the initiation of the procedure. Initial ultrasound scanning demonstrates a large amount of ascites within the right lower abdominal quadrant. The right lower abdomen was prepped and draped in the usual sterile fashion. 1% lidocaine  was used for local anesthesia. Following this, a 6 Fr Safe-T-Centesis catheter was introduced. An ultrasound image was saved for documentation purposes. The paracentesis was performed. The  catheter was removed and a dressing was applied. The patient tolerated the procedure well without immediate post procedural complication. Patient received post-procedure intravenous albumin ; see nursing notes for details. FINDINGS: A total of approximately 2700 ML of YELLOW fluid was removed. Samples were sent to the laboratory IF requested by the clinical team. IMPRESSION: Successful ultrasound-guided paracentesis yielding 2.7 liters of peritoneal fluid. PLAN: If the patient eventually requires >/=2 paracenteses in a 30 day period, candidacy for formal evaluation by the [facility] will be assessed. Electronically Signed   By: Wilkie Lent M.D.   On: 10/14/2023 06:45   US  ASCITES (ABDOMEN LIMITED) Result Date: 10/13/2023 CLINICAL DATA:  Abdominal distension.  Assess for ascites. EXAM: LIMITED ABDOMEN ULTRASOUND FOR ASCITES TECHNIQUE: Limited ultrasound survey for ascites was performed in all four abdominal quadrants. COMPARISON:  None Available. FINDINGS: Evaluation of the 4 quadrants of the abdomen demonstrates moderate volume ascites. IMPRESSION:  Positive for moderate ascites. Electronically Signed   By: Wilkie Lent M.D.   On: 10/13/2023 11:42   DG Abd Portable 1V Result Date: 10/13/2023 CLINICAL DATA:  Abdominal distension. EXAM: PORTABLE ABDOMEN - 1 VIEW COMPARISON:  1225. FINDINGS: The bowel gas pattern is normal. No radio-opaque calculi or other significant radiographic abnormality are seen. IMPRESSION: Negative. Electronically Signed   By: Fonda Field M.D.   On: 10/13/2023 11:02   DG Chest Portable 1 View Result Date: 10/12/2023 EXAM: 1 VIEW XRAY OF THE CHEST 10/12/2023 04:26:00 AM COMPARISON: 2-view chest x-ray 10/05/2023 CLINICAL HISTORY: Chest pain. Shortness of breath. Dialysis patient. FINDINGS: LUNGS AND PLEURA: No focal pulmonary opacity. No pulmonary edema. No pleural effusion. No pneumothorax. HEART AND MEDIASTINUM:  Cardiomegaly is stable. BONES AND SOFT TISSUES: No acute osseous abnormality. IMPRESSION: 1. No acute findings. 2. Stable cardiomegaly. Electronically signed by: Lonni Necessary MD 10/12/2023 04:31 AM EDT RP Workstation: [id number]   DG Chest 2 View Result Date: 10/05/2023 CLINICAL DATA:  Shortness of breath EXAM: CHEST - 2 VIEW COMPARISON:  09/09/2023 FINDINGS: Unchanged cardiomegaly.  Lungs are clear.  Normal pleural spaces. IMPRESSION: Unchanged cardiomegaly. Electronically Signed   By: Franky Stanford M.D.   On: 10/05/2023 22:12    Microbiology: Results for orders placed or performed during the hospital encounter of 10/25/23  MRSA Next Gen by PCR, Nasal     Status: None   Collection Time: 10/25/23  4:57 PM   Specimen: Nasal Mucosa; Nasal Swab  Result Value Ref Range Status   MRSA by PCR Next Gen NOT DETECTED NOT DETECTED Final    Comment: (NOTE) The GeneXpert MRSA Assay (FDA approved for NASAL specimens only), is one component of a comprehensive MRSA colonization surveillance program. It is not intended to diagnose MRSA infection nor to guide or monitor treatment for MRSA infections. Test performance is not FDA approved in patients less than 45 years old. Performed at [facility], [address], [place], [place] [address]     Labs: CBC: Recent Labs  Lab 10/23/23 0310 10/23/23 0356 10/25/23 0748 10/25/23 0854 10/27/23 0800 10/28/23 0813  WBC 11.3*  --  8.3  --  9.2 9.2  NEUTROABS 5.9  --  5.7  --   --   --   HGB 10.3* 11.6* 10.0* 11.9* 10.2* 10.3*  HCT 33.5* 34.0* 32.7* 35.0* 31.7* 32.7*  MCV 89.6  --  89.1  --  85.4 87.4  PLT 239  --  297  --  336 341   Basic Metabolic Panel: Recent Labs  Lab 10/23/23 0310 10/23/23 0356 10/28/23 0214 10/28/23 0819 10/28/23 0953 10/28/23 1340 10/28/23 1716 10/28/23 2053 10/29/23 0257  NA 136   < > 134*   < > 135 136 133* 134* 131*  K 5.2*   < > 4.8   < > 4.0 4.3 4.5 4.4 4.8  CL 97*   < > 96*   < > 94* 95* 95* 96* 96*   CO2 20*   < > 11*   < > 21* 22 24 21* 21*  GLUCOSE 322*   < > 455*   < > 252* 94 110* 170* 204*  BUN 65*   < > 48*   < > 48* 50* 52* 54* 55*  CREATININE 8.96*   < > 6.22*   < > 6.44* 6.37* 6.32* 6.39* 6.68*  CALCIUM  8.6*   < > 8.1*   < > 8.5* 8.6* 8.5* 8.3* 8.1*  PHOS 6.5*  --  6.4*  --   --   --   --   --   --    < > = values in this interval not displayed.   Liver Function Tests: Recent Labs  Lab 10/23/23 0310 10/25/23 0748 10/28/23 0214  AST 58* 23  --   ALT 50* 33  --   ALKPHOS 637* 495*  --   BILITOT 1.1 2.4*  --   PROT 5.5* 5.5*  --   ALBUMIN  2.9* 2.9* 2.5*   CBG: Recent Labs  Lab 10/28/23 1830 10/28/23 1938 10/28/23 2052 10/28/23 2204 10/29/23 0616  GLUCAP 119* 149* 172* 158* 207*    Discharge time spent: greater than 30 minutes.  Signed: Owen DELENA Lore, MD [facility] 10/29/2023

## 2023-10-29 NOTE — Plan of Care (Signed)
 Problem: Education: Goal: Knowledge of General Education information will improve Description: Including pain rating scale, medication(s)/side effects and non-pharmacologic comfort measures Outcome: Adequate for Discharge   Problem: Health Behavior/Discharge Planning: Goal: Ability to manage health-related needs will improve Outcome: Adequate for Discharge   Problem: Clinical Measurements: Goal: Ability to maintain clinical measurements within normal limits will improve Outcome: Adequate for Discharge Goal: Will remain free from infection Outcome: Adequate for Discharge Goal: Diagnostic test results will improve Outcome: Adequate for Discharge Goal: Respiratory complications will improve Outcome: Adequate for Discharge Goal: Cardiovascular complication will be avoided Outcome: Adequate for Discharge   Problem: Activity: Goal: Risk for activity intolerance will decrease 10/29/2023 0957 by Charlann Lucie CROME, RN Outcome: Adequate for Discharge 10/29/2023 0820 by Charlann Lucie CROME, RN Outcome: Progressing   Problem: Nutrition: Goal: Adequate nutrition will be maintained 10/29/2023 0957 by Charlann Lucie CROME, RN Outcome: Adequate for Discharge 10/29/2023 0820 by Charlann Lucie CROME, RN Outcome: Progressing   Problem: Coping: Goal: Level of anxiety will decrease 10/29/2023 0957 by Charlann Lucie CROME, RN Outcome: Adequate for Discharge 10/29/2023 0820 by Charlann Lucie CROME, RN Outcome: Progressing   Problem: Elimination: Goal: Will not experience complications related to bowel motility 10/29/2023 0957 by Charlann Lucie CROME, RN Outcome: Adequate for Discharge 10/29/2023 0820 by Charlann Lucie CROME, RN Outcome: Progressing Goal: Will not experience complications related to urinary retention 10/29/2023 0957 by Charlann Lucie CROME, RN Outcome: Adequate for Discharge 10/29/2023 0820 by Charlann Lucie CROME, RN Outcome: Progressing   Problem: Pain Managment: Goal: General experience of comfort will  improve and/or be controlled 10/29/2023 0957 by Charlann Lucie CROME, RN Outcome: Adequate for Discharge 10/29/2023 0820 by Charlann Lucie CROME, RN Outcome: Progressing   Problem: Safety: Goal: Ability to remain free from injury will improve 10/29/2023 0957 by Charlann Lucie CROME, RN Outcome: Adequate for Discharge 10/29/2023 0820 by Charlann Lucie CROME, RN Outcome: Progressing   Problem: Skin Integrity: Goal: Risk for impaired skin integrity will decrease 10/29/2023 0957 by Charlann Lucie CROME, RN Outcome: Adequate for Discharge 10/29/2023 0820 by Charlann Lucie CROME, RN Outcome: Progressing   Problem: Education: Goal: Ability to describe self-care measures that may prevent or decrease complications (Diabetes Survival Skills Education) will improve Outcome: Adequate for Discharge Goal: Individualized Educational Video(s) Outcome: Adequate for Discharge   Problem: Coping: Goal: Ability to adjust to condition or change in health will improve Outcome: Adequate for Discharge   Problem: Fluid Volume: Goal: Ability to maintain a balanced intake and output will improve Outcome: Adequate for Discharge   Problem: Health Behavior/Discharge Planning: Goal: Ability to identify and utilize available resources and services will improve Outcome: Adequate for Discharge Goal: Ability to manage health-related needs will improve Outcome: Adequate for Discharge   Problem: Metabolic: Goal: Ability to maintain appropriate glucose levels will improve Outcome: Adequate for Discharge   Problem: Nutritional: Goal: Maintenance of adequate nutrition will improve Outcome: Adequate for Discharge Goal: Progress toward achieving an optimal weight will improve Outcome: Adequate for Discharge   Problem: Skin Integrity: Goal: Risk for impaired skin integrity will decrease Outcome: Adequate for Discharge   Problem: Tissue Perfusion: Goal: Adequacy of tissue perfusion will improve Outcome: Adequate for Discharge    Problem: Education: Goal: Ability to describe self-care measures that may prevent or decrease complications (Diabetes Survival Skills Education) will improve Outcome: Adequate for Discharge Goal: Individualized Educational Video(s) Outcome: Adequate for Discharge   Problem: Cardiac: Goal: Ability to maintain an adequate cardiac output will improve Outcome: Adequate for Discharge   Problem: Health  Behavior/Discharge Planning: Goal: Ability to identify and utilize available resources and services will improve Outcome: Adequate for Discharge Goal: Ability to manage health-related needs will improve Outcome: Adequate for Discharge   Problem: Fluid Volume: Goal: Ability to achieve a balanced intake and output will improve Outcome: Adequate for Discharge   Problem: Metabolic: Goal: Ability to maintain appropriate glucose levels will improve Outcome: Adequate for Discharge   Problem: Nutritional: Goal: Maintenance of adequate nutrition will improve Outcome: Adequate for Discharge Goal: Maintenance of adequate weight for body size and type will improve Outcome: Adequate for Discharge   Problem: Respiratory: Goal: Will regain and/or maintain adequate ventilation Outcome: Adequate for Discharge   Problem: Urinary Elimination: Goal: Ability to achieve and maintain adequate renal perfusion and functioning will improve Outcome: Adequate for Discharge

## 2023-10-29 NOTE — TOC Transition Note (Signed)
 Transition of Care [facility]) - Discharge Note   Patient Details  Name: Ashley Freeman MRN: [id number] Date of Birth: 04-06-1991  [facility]) CM/SW Contact:  Roxie KANDICE Stain, RN Phone Number: 10/29/2023, 10:22 AM   Clinical Narrative:    Patient stable for discharge.  Notified Haley with [employer] of rollator order. DME will be delivered to dc lounge.  No other TOC needs.   Final next level of care: Home/Self Care Barriers to Discharge: Continued Medical Work up   Patient Goals and CMS Choice Patient states their goals for this hospitalization and ongoing recovery are:: Return home          Discharge Placement               home        Discharge Plan and Services Additional resources added to the After Visit Summary for                  DME Arranged: Walker rolling with seat DME Agency: [employer] Date DME Agency Contacted: 10/29/23 Time DME Agency Contacted: 1022 Representative spoke with at DME Agency: Darryle            Social Drivers of Health (SDOH) Interventions SDOH Screenings   Food Insecurity: No Food Insecurity (10/25/2023)  Housing: Low Risk  (10/25/2023)  Transportation Needs: Unmet Transportation Needs (10/25/2023)  Utilities: Not At Risk (10/25/2023)  Depression (PHQ2-9): Medium Risk (03/02/2020)  Financial Resource Strain: Low Risk  (09/18/2023)   Received from [facility]  Physical Activity: Insufficiently Active (03/02/2022)   Received from [facility]  Social Connections: Moderately Integrated (08/28/2023)  Stress: No Stress Concern Present (03/08/2023)   Received from [facility]  Tobacco Use: Low Risk  (10/25/2023)     Readmission Risk Interventions    07/23/2023    4:25 PM 07/03/2023   10:14 AM 05/21/2023   11:04 AM  Readmission Risk Prevention Plan  Transportation Screening Complete Complete Complete  PCP or Specialist Appt within 3-5 Days   Complete  Social Work Consult for Recovery Care  Planning/Counseling   Complete  Palliative Care Screening   Not Applicable  Medication Review Oceanographer) Referral to Pharmacy Complete Complete  PCP or Specialist appointment within 3-5 days of discharge Complete Complete   HRI or Home Care Consult Complete Complete   SW Recovery Care/Counseling Consult Complete Complete   Palliative Care Screening Not Applicable Not Applicable   Skilled Nursing Facility Not Applicable Not Applicable

## 2023-10-29 NOTE — Progress Notes (Signed)
 Patient refused CHG at scheduled time. She is too tired but has agreed to do it later. Due time moved from 0600 to 0900 10/29/2023.

## 2023-10-29 NOTE — Progress Notes (Signed)
  [facility] Progress Note  Subjective:  Feels well today. Wants to go home so she can care for her daughter. Plans to get HD at 14 at her unit.  Vitals:   10/28/23 2012 10/28/23 2319 10/29/23 0800 10/29/23 0821  BP: 131/84 132/84 (!) 145/93 (!) 145/93  Pulse: 76 80 78 79  Resp: 18 17    Temp: 98.5 F (36.9 C) 98 F (36.7 C) 98 F (36.7 C) 98.6 F (37 C)  TempSrc: Oral Oral Oral Oral  SpO2: 98% 96% 96% 98%  Weight:      Height:        Exam: Gen alert, no distress Sclera anicteric, moist mucous membranes No jvd or bruits Bilateral chest rise with no increased work of breathing Normal rate, no rub Abd soft and nontender Ext 1+ bilat LE  edema Neuro is alert, Ox 3 , nf            OP HD: TTS [facility] [address]  From April 2025 -> 3h  B350  57.5kjg  R AVG  heparin  1600 + 600 u/hr       Assessment/ Plan: DKA/ DM1: improved with IV insulin . Mgmt per primary ESRD: on HD TTS.  Received dialysis off schedule on 6/29. HD outpatient today HTN: bp's soft, have lowered coreg  + hold orders written if BP < 125 Volume: + bilat LE edema, no resp issues, on RA, CXR negative.  Ultrafiltration limited by low blood pressures at time Anemia of esrd: Hb 10-11 here, follow.  Bipolar d/o: per pmd  Okay to DC from my perspective   10/29/2023, 9:52 AM  Recent Labs  Lab 10/23/23 0310 10/23/23 0356 10/25/23 0748 10/25/23 0854 10/27/23 0800 10/28/23 0214 10/28/23 0813 10/28/23 0819 10/28/23 2053 10/29/23 0257  HGB 10.3*   < > 10.0*   < > 10.2*  --  10.3*  --   --   --   ALBUMIN  2.9*  --  2.9*  --   --  2.5*  --   --   --   --   CALCIUM  8.6*  --  8.8*   < >  --  8.1*  --    < > 8.3* 8.1*  PHOS 6.5*  --   --   --   --  6.4*  --   --   --   --   CREATININE 8.96*  --  7.64*   < >  --  6.22*  --    < > 6.39* 6.68*  K 5.2*   < > 5.6*   < >  --  4.8  --    < > 4.4 4.8   < > = values in this interval not displayed.   No results for input(s): IRON ,  TIBC, FERRITIN in the last 168 hours. Inpatient medications:  atorvastatin   80 mg Oral Daily   bumetanide   2 mg Oral Daily   calcitRIOL   1.25 mcg Oral Q T,Th,Sat-1800   carvedilol   12.5 mg Oral BID WC   Chlorhexidine  Gluconate Cloth  6 each Topical Q0600   DULoxetine   20 mg Oral Daily   famotidine   20 mg Oral Daily   heparin   5,000 Units Subcutaneous Q8H   heparin  sodium (porcine)  1,000 Units Intravenous Once   heparin  sodium (porcine)  2,000 Units Intravenous Once   insulin  aspart  0-5 Units Subcutaneous QHS   insulin  aspart  0-9 Units Subcutaneous TID WC   insulin  glargine-yfgn  5 Units Subcutaneous  Once   [START ON 10/30/2023] insulin  glargine-yfgn  10 Units Subcutaneous Q2200   lamoTRIgine   200 mg Oral Daily   lidocaine   1 patch Transdermal Q24H   lipase/protease/amylase  12,000 Units Oral TID WC   methylphenidate  18 mg Oral q morning   OLANZapine  zydis  5 mg Oral QHS   pantoprazole   40 mg Oral BID   sevelamer  carbonate  2,400 mg Oral TID WC   sodium bicarbonate   1,300 mg Oral BID      albuterol , dextrose , mouth rinse, oxyCODONE , polyethylene glycol, prochlorperazine 

## 2023-10-29 NOTE — Evaluation (Signed)
 Physical Therapy Brief Evaluation and Discharge Note Patient Details Name: Ashley Freeman MRN: [id number] DOB: 1990/10/24 Today's Date: 10/29/2023   History of Present Illness  33 year old admitted 6/25 with chest pain, shortness of breath, volume overload and DKA.  Pt with some encephalopathy.  PMH: ESRD on dialysis, TTS, heart failure with reduced ejection fraction 30 to 35% in May 2025, diabetes type 1, prolonged QT, bipolar  Clinical Impression  Pt admitted with above diagnosis. Pt was able to ambulate with rollator with Modif I without LOB and liked the rollator so she can rest prn.  Recommended pt obtain a rollator and updated CM.  Pt appreciative and for d/c today.  Pt currently with functional limitations due to the deficits listed below (see PT Problem List). Pt will benefit from acute skilled PT to increase their independence and safety with mobility to allow discharge.          PT Assessment Patient does not need any further PT services  Assistance Needed at Discharge  PRN    Equipment Recommendations Rollator (4 wheels)  Recommendations for Other Services       Precautions/Restrictions Precautions Precautions: Fall Restrictions Weight Bearing Restrictions Per Provider Order: No        Mobility  Bed Mobility Rolling: Independent        Transfers Overall transfer level: Independent                      Ambulation/Gait Ambulation/Gait assistance: Modified independent (Device/Increase time) Gait Distance (Feet): 150 Feet Assistive device: Rollator (4 wheels) Gait Pattern/deviations: WFL(Within Functional Limits) Gait Speed: Pace WFL General Gait Details: Pt was able to ambulate in hallway safely with rollator. demonstrates ability to unlock and lock brakes as well. No LOB with rollator and pt aware of how to rest in siting.  Home Activity Instructions    Stairs            Modified Rankin (Stroke Patients Only)         Balance Overall balance assessment: Mild deficits observed, not formally tested Sitting-balance support: No upper extremity supported, Feet supported Sitting balance-Leahy Scale: Good     Standing balance support: Bilateral upper extremity supported, During functional activity Standing balance-Leahy Scale: Fair Standing balance comment: can stand statically without device          Pertinent Vitals/Pain PT - Brief Vital Signs All Vital Signs Stable: Yes Pain Assessment Pain Assessment: No/denies pain     Home Living Family/patient expects to be discharged to:: Private residence Living Arrangements: Children;Parent Available Help at Discharge: Family;Available 24 hours/day Home Environment: Level entry   Home Equipment: None        Prior Function Level of Independence: Independent Comments: Pt does report a few falls in last 6 months    UE/LE Assessment   UE ROM/Strength/Tone/Coordination: WFL    LE ROM/Strength/Tone/Coordination: [facility]      Communication   Communication Communication: No apparent difficulties     Cognition Overall Cognitive Status: Appears within functional limits for tasks assessed/performed       General Comments      Exercises     Assessment/Plan    PT Problem List         PT Visit Diagnosis Muscle weakness (generalized) (M62.81)    No Skilled PT All education completed;Patient at baseline level of functioning;Patient is modified independent with all activity/mobility   Co-evaluation                AMPAC  6 Clicks Help needed turning from your back to your side while in a flat bed without using bedrails?: None Help needed moving from lying on your back to sitting on the side of a flat bed without using bedrails?: None Help needed moving to and from a bed to a chair (including a wheelchair)?: None Help needed standing up from a chair using your arms (e.g., wheelchair or bedside chair)?: None Help needed to walk in  hospital room?: None Help needed climbing 3-5 steps with a railing? : None 6 Click Score: 24      End of Session Equipment Utilized During Treatment: Gait belt Activity Tolerance: Patient tolerated treatment well Patient left: with call bell/phone within reach;in bed (sitting EOB) Nurse Communication: Mobility status PT Visit Diagnosis: Muscle weakness (generalized) (M62.81)     Time: 8997-8986 PT Time Calculation (min) (ACUTE ONLY): 11 min  Charges:   PT Evaluation $PT Eval Low Complexity: 1 Low      Rmani Kapusta M,PT Acute Rehab Services [phone]   Stephane JULIANNA Bevel  10/29/2023, 11:41 AM

## 2023-10-31 ENCOUNTER — Emergency Department (HOSPITAL_COMMUNITY)

## 2023-10-31 ENCOUNTER — Encounter (HOSPITAL_COMMUNITY): Payer: Self-pay | Admitting: Emergency Medicine

## 2023-10-31 ENCOUNTER — Other Ambulatory Visit: Payer: Self-pay

## 2023-10-31 ENCOUNTER — Emergency Department (HOSPITAL_COMMUNITY): Admission: EM | Admit: 2023-10-31 | Discharge: 2023-10-31 | Discharge status: Against Medical Advice

## 2023-10-31 DIAGNOSIS — N179 Acute kidney failure, unspecified: Secondary | ICD-10-CM | POA: Insufficient documentation

## 2023-10-31 DIAGNOSIS — Z794 Long term (current) use of insulin: Secondary | ICD-10-CM | POA: Diagnosis not present

## 2023-10-31 DIAGNOSIS — Z992 Dependence on renal dialysis: Secondary | ICD-10-CM | POA: Diagnosis not present

## 2023-10-31 DIAGNOSIS — Z79899 Other long term (current) drug therapy: Secondary | ICD-10-CM | POA: Diagnosis not present

## 2023-10-31 DIAGNOSIS — Z91199 Patient's noncompliance with other medical treatment and regimen due to unspecified reason: Secondary | ICD-10-CM | POA: Diagnosis not present

## 2023-10-31 DIAGNOSIS — W19XXXA Unspecified fall, initial encounter: Secondary | ICD-10-CM | POA: Diagnosis not present

## 2023-10-31 DIAGNOSIS — N189 Chronic kidney disease, unspecified: Secondary | ICD-10-CM | POA: Diagnosis not present

## 2023-10-31 DIAGNOSIS — R519 Headache, unspecified: Secondary | ICD-10-CM | POA: Insufficient documentation

## 2023-10-31 DIAGNOSIS — E1110 Type 2 diabetes mellitus with ketoacidosis without coma: Secondary | ICD-10-CM | POA: Insufficient documentation

## 2023-10-31 DIAGNOSIS — M25562 Pain in left knee: Secondary | ICD-10-CM | POA: Insufficient documentation

## 2023-10-31 DIAGNOSIS — Z5329 Procedure and treatment not carried out because of patient's decision for other reasons: Secondary | ICD-10-CM | POA: Diagnosis not present

## 2023-10-31 DIAGNOSIS — N186 End stage renal disease: Secondary | ICD-10-CM

## 2023-10-31 DIAGNOSIS — R739 Hyperglycemia, unspecified: Secondary | ICD-10-CM | POA: Diagnosis present

## 2023-10-31 LAB — CBC
HCT: 34.7 % — ABNORMAL LOW (ref 36.0–46.0)
Hemoglobin: 10.5 g/dL — ABNORMAL LOW (ref 12.0–15.0)
MCH: 27.7 pg (ref 26.0–34.0)
MCHC: 30.3 g/dL (ref 30.0–36.0)
MCV: 91.6 fL (ref 80.0–100.0)
Platelets: 351 10*3/uL (ref 150–400)
RBC: 3.79 MIL/uL — ABNORMAL LOW (ref 3.87–5.11)
RDW: 15.9 % — ABNORMAL HIGH (ref 11.5–15.5)
WBC: 8.2 10*3/uL (ref 4.0–10.5)
nRBC: 0 % (ref 0.0–0.2)

## 2023-10-31 LAB — I-STAT VENOUS BLOOD GAS, ED
Acid-base deficit: 5 mmol/L — ABNORMAL HIGH (ref 0.0–2.0)
Bicarbonate: 19.1 mmol/L — ABNORMAL LOW (ref 20.0–28.0)
Calcium, Ion: 0.99 mmol/L — ABNORMAL LOW (ref 1.15–1.40)
HCT: 35 % — ABNORMAL LOW (ref 36.0–46.0)
Hemoglobin: 11.9 g/dL — ABNORMAL LOW (ref 12.0–15.0)
O2 Saturation: 89 %
Potassium: 5.4 mmol/L — ABNORMAL HIGH (ref 3.5–5.1)
Sodium: 126 mmol/L — ABNORMAL LOW (ref 135–145)
TCO2: 20 mmol/L — ABNORMAL LOW (ref 22–32)
pCO2, Ven: 33.3 mmHg — ABNORMAL LOW (ref 44–60)
pH, Ven: 7.366 (ref 7.25–7.43)
pO2, Ven: 59 mmHg — ABNORMAL HIGH (ref 32–45)

## 2023-10-31 LAB — COMPREHENSIVE METABOLIC PANEL WITH GFR
ALT: 21 U/L (ref 0–44)
AST: 24 U/L (ref 15–41)
Albumin: 2.6 g/dL — ABNORMAL LOW (ref 3.5–5.0)
Alkaline Phosphatase: 440 U/L — ABNORMAL HIGH (ref 38–126)
Anion gap: 20 — ABNORMAL HIGH (ref 5–15)
BUN: 74 mg/dL — ABNORMAL HIGH (ref 6–20)
CO2: 19 mmol/L — ABNORMAL LOW (ref 22–32)
Calcium: 8.4 mg/dL — ABNORMAL LOW (ref 8.9–10.3)
Chloride: 90 mmol/L — ABNORMAL LOW (ref 98–111)
Creatinine, Ser: 8.81 mg/dL — ABNORMAL HIGH (ref 0.44–1.00)
GFR, Estimated: 6 mL/min — ABNORMAL LOW (ref >60)
Glucose, Bld: 864 mg/dL (ref 70–99)
Potassium: 5.5 mmol/L — ABNORMAL HIGH (ref 3.5–5.1)
Sodium: 129 mmol/L — ABNORMAL LOW (ref 135–145)
Total Bilirubin: 0.9 mg/dL (ref 0.0–1.2)
Total Protein: 5.5 g/dL — ABNORMAL LOW (ref 6.5–8.1)

## 2023-10-31 LAB — I-STAT CHEM 8, ED
BUN: 65 mg/dL — ABNORMAL HIGH (ref 6–20)
Calcium, Ion: 1.01 mmol/L — ABNORMAL LOW (ref 1.15–1.40)
Chloride: 94 mmol/L — ABNORMAL LOW (ref 98–111)
Creatinine, Ser: 8.4 mg/dL — ABNORMAL HIGH (ref 0.44–1.00)
Glucose, Bld: >700 mg/dL (ref 70–99)
HCT: 36 % (ref 36.0–46.0)
Hemoglobin: 12.2 g/dL (ref 12.0–15.0)
Potassium: 5.4 mmol/L — ABNORMAL HIGH (ref 3.5–5.1)
Sodium: 127 mmol/L — ABNORMAL LOW (ref 135–145)
TCO2: 19 mmol/L — ABNORMAL LOW (ref 22–32)

## 2023-10-31 LAB — CBG MONITORING, ED
Glucose-Capillary: >600 mg/dL (ref 70–99)
Glucose-Capillary: >600 mg/dL (ref 70–99)

## 2023-10-31 LAB — BETA-HYDROXYBUTYRIC ACID: Beta-Hydroxybutyric Acid: 0.11 mmol/L (ref 0.05–0.27)

## 2023-10-31 LAB — HCG, SERUM, QUALITATIVE: Preg, Serum: NEGATIVE

## 2023-10-31 MED ORDER — INSULIN REGULAR(HUMAN) IN NACL 100-0.9 UT/100ML-% IV SOLN: INTRAVENOUS | Status: DC

## 2023-10-31 MED ORDER — DEXTROSE IN LACTATED RINGERS 5 % IV SOLN: INTRAVENOUS | Status: DC

## 2023-10-31 MED ORDER — GLUCOSE 40 % PO GEL: 2.0000 | Freq: Once | ORAL | Status: DC

## 2023-10-31 MED ORDER — ACETAMINOPHEN 325 MG PO TABS
650.0000 mg | ORAL_TABLET | Freq: Once | ORAL | Status: DC
  Filled 2023-10-31: qty 2

## 2023-10-31 MED ORDER — LACTATED RINGERS IV BOLUS: 1000.0000 mL | Freq: Once | INTRAVENOUS | Status: DC

## 2023-10-31 MED ORDER — LACTATED RINGERS IV SOLN: INTRAVENOUS | Status: DC

## 2023-10-31 MED ORDER — DEXTROSE 50 % IV SOLN: 0.0000 mL | INTRAVENOUS | Status: DC | PRN

## 2023-10-31 MED ORDER — INSULIN REGULAR(HUMAN) IN NACL 100-0.9 UT/100ML-% IV SOLN
INTRAVENOUS | Status: DC
  Filled 2023-10-31 (×2): qty 100

## 2023-10-31 NOTE — ED Provider Triage Note (Cosign Needed)
 Emergency Medicine Provider Triage Evaluation Note  Ashley Freeman , a 33 y.o. female  was evaluated in triage.  Pt complains of hyperglycemia, weakness.  Patient reports that she has severe hyperglycemia right now because of drinking high C.  She states that she is on insulin  has been using her medication as prescribed but unsure why she is has hypoglycemic issues.  Was recently hospitalized for DKA last week.  Denies any nausea, vomiting or abdominal pain at this time.  Review of Systems  Positive: As above Negative: As above  Physical Exam  BP (!) 146/84 (BP Location: Left Arm)   Pulse 71   Temp 97.9 F (36.6 C) (Oral)   Resp 18   Ht 5' 3 (1.6 m)   Wt 62.6 kg   LMP 01/29/2020 (Approximate)   SpO2 99%   BMI 24.45 kg/m  Gen:   Awake, no distress   Resp:  Normal effort  MSK:   Moves extremities without difficulty  Other:  Some lower abdominal tenderness present  Medical Decision Making  Medically screening exam initiated at 7:26 PM.  Appropriate orders placed.  Ashley Freeman was informed that the remainder of the evaluation will be completed by another provider, this initial triage assessment does not replace that evaluation, and the importance of remaining in the ED until their evaluation is complete.     Silviano Neuser A, PA-C 10/31/23 1926

## 2023-10-31 NOTE — Discharge Instructions (Addendum)
 You were found to be in DKA and you missed dialysis.  It was recommended that you be admitted to the hospital to get the dialysis and to be started on insulin  drip.  You refused and decided to sign out AGAINST MEDICAL ADVICE.  You are aware of the risks of worsening of your condition and illness and including death.  If you change your mind please return to the emergency department for admission.

## 2023-10-31 NOTE — ED Triage Notes (Signed)
 Pt via [facility] from home c/o weakness and recurring falls x 2 weeks with hyperglycemia and new onset dizziness today. Dialysis pt normally on T/Th/Sat but missed her treatment today. AVF on right side.   BP 156/80 HR 70 CBG >600 O2 98% RA RR 16

## 2023-10-31 NOTE — ED Notes (Addendum)
 Pt was unhappy with tylenol  as a means of pain control. This paramedic advised pt tylenol  should help her pain. Pt requests Dilaudid . Pt states I can take tylenol  at home. This paramedic advises pt our biggest concern is pt blood sugar levels being so high and her being in DKA. This paramedic advises pt she needs an insulin  drip. EDP advised of the situation. EDP came to pt bedside and advised pt that her scans were clear therefore she did not meet the requirements for such strong pain medicine. Pt states she will just go home then. EDP advises pt of the risks of leaving the ED including death. Pt adamantly refuses to stay. This paramedic advised pt she should stay x4. Pt still refusing and requested the AMA form. AMA form brought to pt bedside and read aloud. Due to [facility] pen and several others not working, pt signed using WOW mouse. Physical AMA form also signed by pt and this paramedic. Pt IV removed. Pt asked for water  and crackers which were provided. Pt ambulated to the lobby.

## 2023-10-31 NOTE — ED Notes (Signed)
 Patient transported to CT

## 2023-10-31 NOTE — ED Provider Notes (Addendum)
 Ashley Freeman   CSN: [id number] Arrival date & time: 10/31/23  8096     Patient presents with: Weakness and Hyperglycemia   Ashley Freeman is a 33 y.o. female.   33 year old female presents for evaluation of hyperglycemia.  States today she has not been feeling well has been weak and has fallen twice.  States she is having some left knee pain and she did hit her head when she fell.  States she fell at the [facility] with once and at home the other time.  States she did not lose consciousness.  She missed dialysis today because she was not feeling well.  She has noticed more swelling in her legs and is having some shortness of breath with exertion.  She states she has missed a couple doses of insulin  as well.  Denies any other symptoms or concerns at this time.   Weakness Associated symptoms: no abdominal pain, no arthralgias, no chest pain, no cough, no dysuria, no fever, no seizures, no shortness of breath and no vomiting   Hyperglycemia Associated symptoms: fatigue and weakness   Associated symptoms: no abdominal pain, no chest pain, no dysuria, no fever, no shortness of breath and no vomiting        Prior to Admission medications   Medication Sig Start Date End Date Taking? Authorizing Provider  albuterol  (VENTOLIN  HFA) 108 (90 Base) MCG/ACT inhaler Inhale 2 puffs into the lungs every 4 (four) hours as needed for wheezing or shortness of breath. 11/24/22   [provider]  atorvastatin  (LIPITOR) 80 MG tablet Take 1 tablet (80 mg total) by mouth daily. 10/15/23   Danford, Lonni SQUIBB, MD  bumetanide  (BUMEX ) 2 MG tablet Take 2 mg by mouth daily as needed (for fluid).    [provider]  calcitRIOL  (ROCALTROL ) 0.25 MCG capsule Take 5 capsules (1.25 mcg total) by mouth every Tuesday, Thursday, and Saturday at 6 PM. 07/09/23   Cindy Garnette POUR, MD  carvedilol  (COREG ) 12.5 MG tablet Take 1 tablet (12.5  mg total) by mouth 2 (two) times daily with a meal. 10/29/23   Regalado, Belkys A, MD  Continuous Glucose Sensor (DEXCOM G7 SENSOR) MISC Change sensors every 10 days Patient taking differently: Inject 1 Device into the skin See admin instructions. Place 1 new sensor into the skin every 10 days 09/07/22   Shamleffer, Ibtehal Jaralla, MD  cyclobenzaprine  (FLEXERIL ) 5 MG tablet Take 1 tablet (5 mg total) by mouth 3 (three) times daily as needed for muscle spasms. 08/31/23   Ghimire, Donalda HERO, MD  dicyclomine  (BENTYL ) 20 MG tablet Take 20 mg by mouth 2 (two) times daily as needed for spasms.    [provider]  DULoxetine  (CYMBALTA ) 20 MG capsule Take 20 mg by mouth 2 (two) times daily. 07/29/23   [provider]  famotidine  (PEPCID ) 20 MG tablet Take 20 mg by mouth daily before breakfast. 07/29/23   [provider]  insulin  aspart (NOVOLOG ) 100 UNIT/ML injection Inject 5 Units into the skin 3 (three) times daily with meals.    [provider]  lamoTRIgine  (LAMICTAL ) 200 MG tablet Take 1 tablet (200 mg total) by mouth daily. 10/29/23   Regalado, Belkys A, MD  LANTUS  SOLOSTAR 100 UNIT/ML Solostar Pen Inject 10 Units into the skin in the morning.    [provider]  losartan  (COZAAR ) 100 MG tablet Take 150 mg by mouth daily. 03/25/23   [provider]  methylphenidate  18 MG PO CR tablet Take 18 mg by mouth every morning. 08/21/23   [provider]  metoCLOPramide  (REGLAN ) 10 MG tablet Take 1 tablet (10 mg total) by mouth every 8 (eight) hours as needed for nausea. 10/14/23   Danford, Lonni SQUIBB, MD  OLANZapine  zydis (ZYPREXA ) 5 MG disintegrating tablet Take 5 mg by mouth at bedtime. 09/23/23 12/22/23  [provider]  ondansetron  (ZOFRAN -ODT) 4 MG disintegrating tablet Take 1 tablet (4 mg total) by mouth every 8 (eight) hours as needed for nausea or vomiting. 06/23/23   Davis, Jonathon H, MD  Pancrelipase , Lip-Prot-Amyl, 3000-9500 units CPEP Take  3,000 units of lipase by mouth 3 (three) times daily. 09/23/23 12/22/23  [provider]  prochlorperazine  (COMPAZINE ) 5 MG tablet Take 5 mg by mouth every 6 (six) hours as needed for nausea. 09/23/23   [provider]  sevelamer  carbonate (RENVELA ) 800 MG tablet Take 2,400 mg by mouth 3 (three) times daily with meals.    [provider]  sodium bicarbonate  650 MG tablet Take 1,300 mg by mouth 2 (two) times daily. 07/29/23   [provider]  SUMAtriptan  (IMITREX ) 50 MG tablet Take 50 mg by mouth every 2 (two) hours as needed for migraine or headache. 06/20/22   [provider]    Allergies: Cephalexin , Morphine , Penicillins, Fish allergy, Benadryl  [diphenhydramine ], Dilaudid  [hydromorphone ], Doxycycline , Methotrexate derivatives, and Oxycodone     Review of Systems  Constitutional:  Positive for fatigue. Negative for chills and fever.  HENT:  Negative for ear pain and sore throat.   Eyes:  Negative for pain and visual disturbance.  Respiratory:  Negative for cough and shortness of breath.   Cardiovascular:  Negative for chest pain and palpitations.  Gastrointestinal:  Negative for abdominal pain and vomiting.  Genitourinary:  Negative for dysuria and hematuria.  Musculoskeletal:  Negative for arthralgias and back pain.       Admits left knee pain`  Skin:  Negative for color change and rash.  Neurological:  Positive for weakness and light-headedness. Negative for seizures and syncope.  All other systems reviewed and are negative.   Updated Vital Signs BP (!) 146/84 (BP Location: Left Arm)   Pulse 71   Temp 97.9 F (36.6 C) (Oral)   Resp 18   Ht 5' 3 (1.6 m)   Wt 62.6 kg   LMP 01/29/2020 (Approximate)   SpO2 99%   BMI 24.45 kg/m   Physical Exam Vitals and nursing Freeman reviewed.  Constitutional:      General: She is not in acute distress.    Appearance: Normal appearance. She is well-developed. She is not ill-appearing.  HENT:     Head:  Normocephalic and atraumatic.  Eyes:     Conjunctiva/sclera: Conjunctivae normal.  Cardiovascular:     Rate and Rhythm: Normal rate and regular rhythm.     Heart sounds: No murmur heard. Pulmonary:     Effort: Pulmonary effort is normal. No respiratory distress.     Breath sounds: Normal breath sounds.  Abdominal:     General: There is distension.     Palpations: Abdomen is soft.     Tenderness: There is no abdominal tenderness.  Musculoskeletal:        General: No swelling.     Cervical back: Neck supple.     Right lower leg: Edema present.     Left lower leg: Edema present.     Comments: There is mild bilateral left knee tenderness to palpation but full range  of motion of the knee, there is 2+ pitting edema bilateral lower extremities  Skin:    General: Skin is warm and dry.     Capillary Refill: Capillary refill takes less than 2 seconds.  Neurological:     Mental Status: She is alert.  Psychiatric:        Mood and Affect: Mood normal.     (all labs ordered are listed, but only abnormal results are displayed) Labs Reviewed  CBC - Abnormal; Notable for the following components:      Result Value   RBC 3.79 (*)    Hemoglobin 10.5 (*)    HCT 34.7 (*)    RDW 15.9 (*)    All other components within normal limits  COMPREHENSIVE METABOLIC PANEL WITH GFR - Abnormal; Notable for the following components:   Sodium 129 (*)    Potassium 5.5 (*)    Chloride 90 (*)    CO2 19 (*)    Glucose, Bld 864 (*)    BUN 74 (*)    Creatinine, Ser 8.81 (*)    Calcium  8.4 (*)    Total Protein 5.5 (*)    Albumin  2.6 (*)    Alkaline Phosphatase 440 (*)    GFR, Estimated 6 (*)    Anion gap 20 (*)    All other components within normal limits  CBG MONITORING, ED - Abnormal; Notable for the following components:   Glucose-Capillary >600 (*)    All other components within normal limits  I-STAT VENOUS BLOOD GAS, ED - Abnormal; Notable for the following components:   pCO2, Ven 33.3 (*)     pO2, Ven 59 (*)    Bicarbonate 19.1 (*)    TCO2 20 (*)    Acid-base deficit 5.0 (*)    Sodium 126 (*)    Potassium 5.4 (*)    Calcium , Ion 0.99 (*)    HCT 35.0 (*)    Hemoglobin 11.9 (*)    All other components within normal limits  I-STAT CHEM 8, ED - Abnormal; Notable for the following components:   Sodium 127 (*)    Potassium 5.4 (*)    Chloride 94 (*)    BUN 65 (*)    Creatinine, Ser 8.40 (*)    Glucose, Bld >700 (*)    Calcium , Ion 1.01 (*)    TCO2 19 (*)    All other components within normal limits  CBG MONITORING, ED - Abnormal; Notable for the following components:   Glucose-Capillary >600 (*)    All other components within normal limits  HCG, SERUM, QUALITATIVE  BETA-HYDROXYBUTYRIC ACID  URINALYSIS, ROUTINE W REFLEX MICROSCOPIC  CBG MONITORING, ED    EKG: EKG Interpretation Date/Time:  Thursday October 31 2023 19:24:52 EDT Ventricular Rate:  70 PR Interval:  168 QRS Duration:  82 QT Interval:  456 QTC Calculation: 492 R Axis:   -4  Text Interpretation: Normal sinus rhythm Anterior infarct , age undetermined Abnormal ECG When compared with ECG of 25-Oct-2023 08:59, Confirmed by Gennaro Bouchard ([id number]) on 10/31/2023 7:38:50 PM  Radiology: CT Head Wo Contrast Result Date: 10/31/2023 CLINICAL DATA:  Clemens, hit head EXAM: CT HEAD WITHOUT CONTRAST TECHNIQUE: Contiguous axial images were obtained from the base of the skull through the vertex without intravenous contrast. RADIATION DOSE REDUCTION: This exam was performed according to the departmental dose-optimization program which includes automated exposure control, adjustment of the mA and/or kV according to patient size and/or use of iterative reconstruction technique. COMPARISON:  CT head 09/07/2022  FINDINGS: Brain: No intracranial hemorrhage, mass effect, or evidence of acute infarct. No hydrocephalus. No extra-axial fluid collection. Vascular: No hyperdense vessel or unexpected calcification. Skull: No fracture or focal  lesion. Sinuses/Orbits: No acute finding. Other: None. IMPRESSION: No acute intracranial abnormality. Electronically Signed   By: Norman Gatlin M.D.   On: 10/31/2023 21:24   DG Knee Complete 4 Views Left Result Date: 10/31/2023 CLINICAL DATA:  Status post fall. EXAM: LEFT KNEE - COMPLETE 4+ VIEW COMPARISON:  None Available. FINDINGS: No evidence of fracture, dislocation, or joint effusion. No evidence of arthropathy or other focal bone abnormality. Diffuse soft tissue swelling is seen which is likely, in part, related to the patient's body habitus. IMPRESSION: No acute osseous abnormality. Electronically Signed   By: Suzen Dials M.D.   On: 10/31/2023 20:40   DG Chest 1 View Result Date: 10/31/2023 CLINICAL DATA:  Chest pain and shortness of breath. EXAM: CHEST  1 VIEW COMPARISON:  October 25, 2023 FINDINGS: The cardiac silhouette is enlarged and unchanged in size. Low lung volumes are noted. Both lungs are clear. The visualized skeletal structures are unremarkable. IMPRESSION: No active cardiopulmonary disease. Electronically Signed   By: Suzen Dials M.D.   On: 10/31/2023 20:37     Procedures   Medications Ordered in the ED  dextrose  (GLUTOSE) oral gel 40% (peds > 20kg and adults) (has no administration in time range)  insulin  regular, human (MYXREDLIN ) 100 units/ 100 mL infusion (has no administration in time range)  dextrose  50 % solution 0-50 mL (has no administration in time range)  lactated ringers  infusion (has no administration in time range)  dextrose  5 % in lactated ringers  infusion (has no administration in time range)  acetaminophen  (TYLENOL ) tablet 650 mg (has no administration in time range)                                    Medical Decision Making Medical Decision Making Nursing notes are reviewed. Differential diagnosis for this patient would include but not limited to: DKA, HHS, acute on chronic renal failure, electrolyte abnormality, other  Emergency Department  Course:  Vital signs and pulse oximetry are reviewed, evaluated by myself and found to be within normal limits prior to final disposition. Findings of laboratory testing and medical imaging are discussed with patient and family that is available. Patient agrees with the medical care plan as follows:  Patient not been to dialysis since Tuesday.  She admits to missing some doses of her insulin .  She been in DKA with an anion gap of 14 and acute on chronic renal failure.  She does appear fluid overloaded, has a distended abdomen.  I ordered an insulin  drip and small bolus of IV fluids for her hyperglycemia, glucose is over 800.  Patient was given Tylenol  for her pain.  She did have a fall today but had no obvious fractures or injuries and had full range of motion of all of her extremities.  I ordered Tylenol  for pain and she states she could go home and take that there and requested Dilaudid .  I told her without fractures or acute surgical needs she did not require IV narcotics.  She decided to leave AGAINST MEDICAL ADVICE.  I discussed all the risks with her including worsening of her condition and death and she still wants to leave AMA.  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     Details: Prior records reviewed and patient was recently admitted for DKA has a history of noncompliance with her medication regimen and with dialysis Labs: ordered. Decision-making details documented in ED Course.    Details: Patient's labs ordered and reviewed by me.  She is in DKA, is not acidotic but does have a low bicarb on her blood gas and anion gap of 14.  Glucose is over 800 and she has acute on chronic kidney failure. Radiology: ordered and independent interpretation performed. Decision-making details documented in ED Course.    Details: Imaging ordered and interpreted independently of radiology Chest x-ray: Shows no acute abnormality CT head: Shows no acute abnormality Left knee x-ray: Shows no bony abnormality or fracture Discussion of management or test interpretation with external provider(s): Interpreted by me in the absence of cardiology and shows sinus rhythm, no STEMI, no acute change when compared to prior  Risk OTC drugs. Prescription drug management. Drug therapy requiring intensive monitoring for toxicity. Decision regarding hospitalization. Diagnosis or treatment significantly limited by social determinants of health.     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

## 2023-10-31 NOTE — ED Notes (Incomplete)
 Pt expresses dissatisfaction with tylenol  for pain control. This provider advises pt tylenol  should help her pain. This provider advises pt her sugar is the most prominate concern and we need to get that under control

## 2023-11-02 ENCOUNTER — Encounter (HOSPITAL_COMMUNITY): Payer: Self-pay

## 2023-11-02 ENCOUNTER — Inpatient Hospital Stay (HOSPITAL_COMMUNITY)
Admission: EM | Admit: 2023-11-02 | Discharge: 2023-11-09 | DRG: 640 | Disposition: A | Discharge status: Home / Self Care | Attending: Internal Medicine | Admitting: Internal Medicine

## 2023-11-02 ENCOUNTER — Emergency Department (HOSPITAL_COMMUNITY)

## 2023-11-02 ENCOUNTER — Other Ambulatory Visit: Payer: Self-pay

## 2023-11-02 DIAGNOSIS — E785 Hyperlipidemia, unspecified: Secondary | ICD-10-CM | POA: Diagnosis present

## 2023-11-02 DIAGNOSIS — T40605A Adverse effect of unspecified narcotics, initial encounter: Secondary | ICD-10-CM | POA: Diagnosis present

## 2023-11-02 DIAGNOSIS — I5022 Chronic systolic (congestive) heart failure: Secondary | ICD-10-CM | POA: Diagnosis present

## 2023-11-02 DIAGNOSIS — L299 Pruritus, unspecified: Secondary | ICD-10-CM | POA: Diagnosis present

## 2023-11-02 DIAGNOSIS — E875 Hyperkalemia: Secondary | ICD-10-CM | POA: Diagnosis present

## 2023-11-02 DIAGNOSIS — Z91158 Patient's noncompliance with renal dialysis for other reason: Secondary | ICD-10-CM

## 2023-11-02 DIAGNOSIS — K8689 Other specified diseases of pancreas: Secondary | ICD-10-CM | POA: Diagnosis present

## 2023-11-02 DIAGNOSIS — I5023 Acute on chronic systolic (congestive) heart failure: Secondary | ICD-10-CM | POA: Diagnosis present

## 2023-11-02 DIAGNOSIS — E8779 Other fluid overload: Principal | ICD-10-CM | POA: Diagnosis present

## 2023-11-02 DIAGNOSIS — I132 Hypertensive heart and chronic kidney disease with heart failure and with stage 5 chronic kidney disease, or end stage renal disease: Secondary | ICD-10-CM | POA: Diagnosis present

## 2023-11-02 DIAGNOSIS — N186 End stage renal disease: Secondary | ICD-10-CM

## 2023-11-02 DIAGNOSIS — E109 Type 1 diabetes mellitus without complications: Secondary | ICD-10-CM | POA: Diagnosis present

## 2023-11-02 DIAGNOSIS — I5042 Chronic combined systolic (congestive) and diastolic (congestive) heart failure: Secondary | ICD-10-CM

## 2023-11-02 DIAGNOSIS — Z79899 Other long term (current) drug therapy: Secondary | ICD-10-CM

## 2023-11-02 DIAGNOSIS — Z560 Unemployment, unspecified: Secondary | ICD-10-CM

## 2023-11-02 DIAGNOSIS — Z888 Allergy status to other drugs, medicaments and biological substances status: Secondary | ICD-10-CM

## 2023-11-02 DIAGNOSIS — E872 Acidosis, unspecified: Secondary | ICD-10-CM | POA: Diagnosis present

## 2023-11-02 DIAGNOSIS — E8770 Fluid overload, unspecified: Secondary | ICD-10-CM | POA: Insufficient documentation

## 2023-11-02 DIAGNOSIS — Z880 Allergy status to penicillin: Secondary | ICD-10-CM

## 2023-11-02 DIAGNOSIS — K219 Gastro-esophageal reflux disease without esophagitis: Secondary | ICD-10-CM | POA: Diagnosis present

## 2023-11-02 DIAGNOSIS — E1065 Type 1 diabetes mellitus with hyperglycemia: Secondary | ICD-10-CM | POA: Diagnosis present

## 2023-11-02 DIAGNOSIS — Z833 Family history of diabetes mellitus: Secondary | ICD-10-CM

## 2023-11-02 DIAGNOSIS — Z881 Allergy status to other antibiotic agents status: Secondary | ICD-10-CM

## 2023-11-02 DIAGNOSIS — Z825 Family history of asthma and other chronic lower respiratory diseases: Secondary | ICD-10-CM

## 2023-11-02 DIAGNOSIS — I428 Other cardiomyopathies: Secondary | ICD-10-CM | POA: Diagnosis present

## 2023-11-02 DIAGNOSIS — E1022 Type 1 diabetes mellitus with diabetic chronic kidney disease: Secondary | ICD-10-CM | POA: Diagnosis present

## 2023-11-02 DIAGNOSIS — Z9049 Acquired absence of other specified parts of digestive tract: Secondary | ICD-10-CM

## 2023-11-02 DIAGNOSIS — R188 Other ascites: Secondary | ICD-10-CM | POA: Diagnosis present

## 2023-11-02 DIAGNOSIS — E10649 Type 1 diabetes mellitus with hypoglycemia without coma: Secondary | ICD-10-CM | POA: Diagnosis not present

## 2023-11-02 DIAGNOSIS — F319 Bipolar disorder, unspecified: Secondary | ICD-10-CM | POA: Diagnosis present

## 2023-11-02 DIAGNOSIS — D631 Anemia in chronic kidney disease: Secondary | ICD-10-CM | POA: Diagnosis present

## 2023-11-02 DIAGNOSIS — R102 Pelvic and perineal pain: Secondary | ICD-10-CM | POA: Diagnosis present

## 2023-11-02 DIAGNOSIS — Z555 Less than a high school diploma: Secondary | ICD-10-CM

## 2023-11-02 DIAGNOSIS — Z794 Long term (current) use of insulin: Secondary | ICD-10-CM

## 2023-11-02 DIAGNOSIS — E1100 Type 2 diabetes mellitus with hyperosmolarity without nonketotic hyperglycemic-hyperosmolar coma (NKHHC): Principal | ICD-10-CM | POA: Diagnosis present

## 2023-11-02 DIAGNOSIS — Z5982 Transportation insecurity: Secondary | ICD-10-CM

## 2023-11-02 DIAGNOSIS — Z885 Allergy status to narcotic agent status: Secondary | ICD-10-CM

## 2023-11-02 DIAGNOSIS — Z992 Dependence on renal dialysis: Secondary | ICD-10-CM

## 2023-11-02 DIAGNOSIS — I5020 Unspecified systolic (congestive) heart failure: Secondary | ICD-10-CM | POA: Diagnosis present

## 2023-11-02 LAB — I-STAT VENOUS BLOOD GAS, ED
Acid-base deficit: 5 mmol/L — ABNORMAL HIGH (ref 0.0–2.0)
Bicarbonate: 21.7 mmol/L (ref 20.0–28.0)
Calcium, Ion: 1.04 mmol/L — ABNORMAL LOW (ref 1.15–1.40)
HCT: 36 % (ref 36.0–46.0)
Hemoglobin: 12.2 g/dL (ref 12.0–15.0)
O2 Saturation: 99 %
Potassium: 5.4 mmol/L — ABNORMAL HIGH (ref 3.5–5.1)
Sodium: 124 mmol/L — ABNORMAL LOW (ref 135–145)
TCO2: 23 mmol/L (ref 22–32)
pCO2, Ven: 45.9 mmHg (ref 44–60)
pH, Ven: 7.282 (ref 7.25–7.43)
pO2, Ven: 174 mmHg — ABNORMAL HIGH (ref 32–45)

## 2023-11-02 LAB — BASIC METABOLIC PANEL WITH GFR
Anion gap: 18 — ABNORMAL HIGH (ref 5–15)
BUN: 77 mg/dL — ABNORMAL HIGH (ref 6–20)
CO2: 19 mmol/L — ABNORMAL LOW (ref 22–32)
Calcium: 8.5 mg/dL — ABNORMAL LOW (ref 8.9–10.3)
Chloride: 90 mmol/L — ABNORMAL LOW (ref 98–111)
Creatinine, Ser: 9.28 mg/dL — ABNORMAL HIGH (ref 0.44–1.00)
GFR, Estimated: 5 mL/min — ABNORMAL LOW (ref >60)
Glucose, Bld: 827 mg/dL (ref 70–99)
Potassium: 5.5 mmol/L — ABNORMAL HIGH (ref 3.5–5.1)
Sodium: 127 mmol/L — ABNORMAL LOW (ref 135–145)

## 2023-11-02 LAB — CBC WITH DIFFERENTIAL/PLATELET
Abs Immature Granulocytes: 0.07 K/uL (ref 0.00–0.07)
Basophils Absolute: 0.1 K/uL (ref 0.0–0.1)
Basophils Relative: 1 %
Eosinophils Absolute: 0.9 K/uL — ABNORMAL HIGH (ref 0.0–0.5)
Eosinophils Relative: 9 %
HCT: 36.3 % (ref 36.0–46.0)
Hemoglobin: 10.9 g/dL — ABNORMAL LOW (ref 12.0–15.0)
Immature Granulocytes: 1 %
Lymphocytes Relative: 19 %
Lymphs Abs: 2 K/uL (ref 0.7–4.0)
MCH: 26.8 pg (ref 26.0–34.0)
MCHC: 30 g/dL (ref 30.0–36.0)
MCV: 89.4 fL (ref 80.0–100.0)
Monocytes Absolute: 0.9 K/uL (ref 0.1–1.0)
Monocytes Relative: 8 %
Neutro Abs: 6.4 K/uL (ref 1.7–7.7)
Neutrophils Relative %: 62 %
Platelets: 309 K/uL (ref 150–400)
RBC: 4.06 MIL/uL (ref 3.87–5.11)
RDW: 15.4 % (ref 11.5–15.5)
WBC: 10.2 K/uL (ref 4.0–10.5)
nRBC: 0 % (ref 0.0–0.2)

## 2023-11-02 LAB — BETA-HYDROXYBUTYRIC ACID: Beta-Hydroxybutyric Acid: 0.1 mmol/L (ref 0.05–0.27)

## 2023-11-02 LAB — CBG MONITORING, ED
Glucose-Capillary: >600 mg/dL (ref 70–99)
Glucose-Capillary: >600 mg/dL (ref 70–99)

## 2023-11-02 LAB — HCG, SERUM, QUALITATIVE: Preg, Serum: NEGATIVE

## 2023-11-02 LAB — OSMOLALITY: Osmolality: 339 mosm/kg (ref 275–295)

## 2023-11-02 MED ORDER — DEXTROSE 50 % IV SOLN
0.0000 mL | INTRAVENOUS | Status: DC | PRN
  Administered 2023-11-04 – 2023-11-06 (×2): 50 mL via INTRAVENOUS
  Filled 2023-11-02 (×5): qty 50

## 2023-11-02 MED ORDER — INSULIN REGULAR(HUMAN) IN NACL 100-0.9 UT/100ML-% IV SOLN
INTRAVENOUS | Status: DC
  Administered 2023-11-02: 5.5 [IU]/h via INTRAVENOUS
  Administered 2023-11-03: 1.3 [IU]/h via INTRAVENOUS
  Filled 2023-11-02: qty 100

## 2023-11-02 MED ORDER — FENTANYL CITRATE PF 50 MCG/ML IJ SOSY
50.0000 ug | PREFILLED_SYRINGE | Freq: Once | INTRAMUSCULAR | Status: AC
  Administered 2023-11-03: 50 ug via INTRAVENOUS
  Filled 2023-11-02: qty 1

## 2023-11-02 MED ORDER — ACETAMINOPHEN 500 MG PO TABS
1000.0000 mg | ORAL_TABLET | Freq: Once | ORAL | Status: AC
  Administered 2023-11-03: 1000 mg via ORAL
  Filled 2023-11-02: qty 2

## 2023-11-02 MED ORDER — FENTANYL CITRATE PF 50 MCG/ML IJ SOSY
25.0000 ug | PREFILLED_SYRINGE | Freq: Once | INTRAMUSCULAR | Status: AC
  Administered 2023-11-02: 25 ug via INTRAVENOUS
  Filled 2023-11-02: qty 1

## 2023-11-02 MED ORDER — LACTATED RINGERS IV SOLN
INTRAVENOUS | Status: AC
Start: 2023-11-02 — End: 2023-11-03

## 2023-11-02 MED ORDER — DEXTROSE IN LACTATED RINGERS 5 % IV SOLN
INTRAVENOUS | Status: DC
  Filled 2023-11-02: qty 1000

## 2023-11-02 NOTE — ED Triage Notes (Signed)
 Patient BIB [facility] from home for hyperglycemia, patient also missed dialysis today d/t issues with transportation. Patient usually T/TH/Sa, missed Th and today. Home glucometer read high, EMS reports patient appears fluid overloaded. VSS.

## 2023-11-02 NOTE — ED Notes (Signed)
 Patient transported to X-ray

## 2023-11-02 NOTE — ED Provider Notes (Signed)
 [place] [facility]GENC[facility] DEPARTMENT AT [facility] Provider Note   CSN: [id number] Arrival date & time: 11/02/23  2104     Patient presents with: Hyperglycemia   Ashley Freeman is a 33 y.o. female with medical history significant for ESRD on dialysis Tuesday Thursday Saturday, heart failure with ejection fraction of 30 to 35% in May 2025, type 1 diabetes, history of prolonged QT.  She presents today with concern for shortness of breath, elevated blood sugars, recent admission for DKA.  Patient states that she usually misses 2 dialysis sessions per week, last dialysis session was Tuesday 7/1.  She was seen in the ED on 7/3 and was in DKA at that time as well, admission was recommended but patient left AMA at that time.  She also reports abdominal distention and shortness of breath; pain and swelling her right labia.  Echo on 08/29/2023 with EF of 30-35%.   HPI     Prior to Admission medications   Medication Sig Start Date End Date Taking? Authorizing Provider  atorvastatin  (LIPITOR ) 80 MG tablet Take 1 tablet (80 mg total) by mouth daily. 10/15/23  Yes Danford, Lonni SQUIBB, MD  bumetanide  (BUMEX ) 2 MG tablet Take 2 mg by mouth daily.   Yes [provider]  calcitRIOL  (ROCALTROL ) 0.25 MCG capsule Take 5 capsules (1.25 mcg total) by mouth every Tuesday, Thursday, and Saturday at 6 PM. 07/09/23  Yes Cindy Garnette POUR, MD  carvedilol  (COREG ) 12.5 MG tablet Take 1 tablet (12.5 mg total) by mouth 2 (two) times daily with a meal. 10/29/23  Yes Regalado, Belkys A, MD  Continuous Glucose Sensor (DEXCOM G7 SENSOR) MISC Change sensors every 10 days Patient taking differently: Inject 1 Device into the skin See admin instructions. Place 1 new sensor into the skin every 10 days 09/07/22  Yes Shamleffer, Ibtehal Jaralla, MD  cyclobenzaprine  (FLEXERIL ) 5 MG tablet Take 1 tablet (5 mg total) by mouth 3 (three) times daily as needed for muscle spasms. 08/31/23  Yes Ghimire, Donalda HERO, MD   dicyclomine  (BENTYL ) 20 MG tablet Take 20 mg by mouth 2 (two) times daily as needed for spasms.   Yes [provider]  DULoxetine  (CYMBALTA ) 20 MG capsule Take 20 mg by mouth 2 (two) times daily. 07/29/23  Yes [provider]  famotidine  (PEPCID ) 20 MG tablet Take 20 mg by mouth daily before breakfast. 07/29/23  Yes [provider]  insulin  aspart (NOVOLOG ) 100 UNIT/ML injection Inject 5 Units into the skin 3 (three) times daily with meals.   Yes [provider]  lamoTRIgine  (LAMICTAL ) 200 MG tablet Take 1 tablet (200 mg total) by mouth daily. 10/29/23  Yes Regalado, Belkys A, MD  LANTUS  SOLOSTAR 100 UNIT/ML Solostar Pen Inject 10 Units into the skin in the morning.   Yes [provider]  losartan  (COZAAR ) 100 MG tablet Take 150 mg by mouth daily. 03/25/23  Yes [provider]  metoCLOPramide  (REGLAN ) 10 MG tablet Take 1 tablet (10 mg total) by mouth every 8 (eight) hours as needed for nausea. 10/14/23  Yes Danford, Lonni SQUIBB, MD  OLANZapine  zydis (ZYPREXA ) 5 MG disintegrating tablet Take 5 mg by mouth at bedtime. 09/23/23 12/22/23 Yes [provider]  ondansetron  (ZOFRAN -ODT) 4 MG disintegrating tablet Take 1 tablet (4 mg total) by mouth every 8 (eight) hours as needed for nausea or vomiting. 06/23/23  Yes Nicholaus Cassondra DEL, MD  Pancrelipase , Lip-Prot-Amyl, 3000-9500 units CPEP Take 3,000 units of lipase by mouth 3 (three) times daily.  09/23/23 12/22/23 Yes [provider]  sevelamer  carbonate (RENVELA ) 800 MG tablet Take 2,400 mg by mouth 3 (three) times daily with meals.   Yes [provider]  sodium bicarbonate  650 MG tablet Take 1,300 mg by mouth 2 (two) times daily. 07/29/23  Yes [provider]  SUMAtriptan  (IMITREX ) 50 MG tablet Take 50 mg by mouth every 2 (two) hours as needed for migraine or headache. 06/20/22  Yes [provider]  albuterol  (VENTOLIN  HFA) 108 (90 Base) MCG/ACT inhaler Inhale 2 puffs  into the lungs every 4 (four) hours as needed for wheezing or shortness of breath. Patient not taking: Reported on 11/02/2023 11/24/22   [provider]  prochlorperazine  (COMPAZINE ) 5 MG tablet Take 5 mg by mouth every 6 (six) hours as needed for nausea. 09/23/23   [provider]    Allergies: Cephalexin , Morphine , Penicillins, Fish allergy, Benadryl  [diphenhydramine ], Dilaudid  [hydromorphone ], Doxycycline , Methotrexate derivatives, and Oxycodone     Review of Systems  Respiratory:  Positive for shortness of breath.   Gastrointestinal:  Positive for abdominal distention and nausea.  Genitourinary:        Labial pain and swelling x 2 days  Musculoskeletal:  Positive for back pain.    Updated Vital Signs BP (!) 171/100 (BP Location: Left Arm)   Pulse 91   Temp 98.5 F (36.9 C) (Oral)   Resp (!) 23   LMP 01/29/2020 (Approximate)   SpO2 97%   Physical Exam Vitals and nursing note reviewed. Chaperone present: ED RN Caitlyn.  Constitutional:      Appearance: She is not toxic-appearing.  HENT:     Head: Normocephalic and atraumatic.     Mouth/Throat:     Mouth: Mucous membranes are moist.     Pharynx: No oropharyngeal exudate or posterior oropharyngeal erythema.  Eyes:     General:        Right eye: No discharge.        Left eye: No discharge.     Conjunctiva/sclera: Conjunctivae normal.     Pupils: Pupils are equal, round, and reactive to light.  Cardiovascular:     Rate and Rhythm: Normal rate and regular rhythm.     Pulses: Normal pulses.     Heart sounds: Normal heart sounds.     Comments: Pitting edema in the ankles only up to the abdomen, anasarca Pulmonary:     Effort: Pulmonary effort is normal. Tachypnea present. No respiratory distress.     Breath sounds: Examination of the right-lower field reveals rales. Examination of the left-lower field reveals rales. Rales present. No wheezing.  Abdominal:     General: A surgical scar is present. There is  distension.     Tenderness: There is no abdominal tenderness.  Genitourinary:    Exam position: Supine.   Musculoskeletal:        General: No deformity.     Cervical back: Normal range of motion and neck supple.     Right lower leg: 3+ Edema present.     Left lower leg: 3+ Edema present.  Skin:    General: Skin is warm and dry.     Capillary Refill: Capillary refill takes less than 2 seconds.  Neurological:     General: No focal deficit present.     Mental Status: She is alert and oriented to person, place, and time. Mental status is at baseline.  Psychiatric:        Mood and Affect: Mood normal.     (all labs ordered  are listed, but only abnormal results are displayed) Labs Reviewed  BASIC METABOLIC PANEL WITH GFR - Abnormal; Notable for the following components:      Result Value   Sodium 127 (*)    Potassium 5.5 (*)    Chloride 90 (*)    CO2 19 (*)    Glucose, Bld 827 (*)    BUN 77 (*)    Creatinine, Ser 9.28 (*)    Calcium  8.5 (*)    GFR, Estimated 5 (*)    Anion gap 18 (*)    All other components within normal limits  CBC WITH DIFFERENTIAL/PLATELET - Abnormal; Notable for the following components:   Hemoglobin 10.9 (*)    Eosinophils Absolute 0.9 (*)    All other components within normal limits  OSMOLALITY - Abnormal; Notable for the following components:   Osmolality 339 (*)    All other components within normal limits  CBG MONITORING, ED - Abnormal; Notable for the following components:   Glucose-Capillary >600 (*)    All other components within normal limits  CBG MONITORING, ED - Abnormal; Notable for the following components:   Glucose-Capillary >600 (*)    All other components within normal limits  I-STAT VENOUS BLOOD GAS, ED - Abnormal; Notable for the following components:   pO2, Ven 174 (*)    Acid-base deficit 5.0 (*)    Sodium 124 (*)    Potassium 5.4 (*)    Calcium , Ion 1.04 (*)    All other components within normal limits  BETA-HYDROXYBUTYRIC  ACID  HCG, SERUM, QUALITATIVE  BASIC METABOLIC PANEL WITH GFR  BASIC METABOLIC PANEL WITH GFR  BASIC METABOLIC PANEL WITH GFR  BETA-HYDROXYBUTYRIC ACID  BETA-HYDROXYBUTYRIC ACID  BETA-HYDROXYBUTYRIC ACID  URINALYSIS, ROUTINE W REFLEX MICROSCOPIC  HEPATIC FUNCTION PANEL  LIPASE, BLOOD  BASIC METABOLIC PANEL WITH GFR  BETA-HYDROXYBUTYRIC ACID    EKG: None  Radiology: DG Chest 2 View Result Date: 11/02/2023 CLINICAL DATA:  Shortness of breath EXAM: CHEST - 2 VIEW COMPARISON:  10/31/2023 FINDINGS: Shallow inspiration. Cardiac enlargement. No vascular congestion, edema, or consolidation. No pleural effusion or pneumothorax. Mediastinal contours appear intact. No significant changes. IMPRESSION: Cardiac enlargement.  No evidence of active pulmonary disease. Electronically Signed   By: Elsie Gravely M.D.   On: 11/02/2023 23:35     .Critical Care  Performed by: Bobette Pleasant SAUNDERS, PA-C Authorized by: Bobette Pleasant SAUNDERS, PA-C   Critical care provider statement:    Critical care time (minutes):  45   Critical care was time spent personally by me on the following activities:  Development of treatment plan with patient or surrogate, discussions with consultants, evaluation of patient's response to treatment, examination of patient, obtaining history from patient or surrogate, ordering and performing treatments and interventions, ordering and review of laboratory studies, ordering and review of radiographic studies, pulse oximetry and re-evaluation of patient's condition    Medications Ordered in the ED  insulin  regular, human (MYXREDLIN ) 100 units/ 100 mL infusion (5.5 Units/hr Intravenous New Bag/Given 11/02/23 2307)  lactated ringers  infusion ( Intravenous New Bag/Given 11/02/23 2308)  dextrose  5 % in lactated ringers  infusion (has no administration in time range)  dextrose  50 % solution 0-50 mL (has no administration in time range)  fentaNYL  (SUBLIMAZE ) injection 50 mcg (has no  administration in time range)  acetaminophen  (TYLENOL ) tablet 1,000 mg (has no administration in time range)  fentaNYL  (SUBLIMAZE ) injection 25 mcg (25 mcg Intravenous Given 11/02/23 2301)    Clinical Course as of 11/03/23  9997  Sat Nov 02, 2023  2257 Consult to Dr. Dolan, nephrologist, who will see the patient and add her for dialysis, likely in the AM. I appreciate his collaboration in the care of this patient.  [RS]  2359 Consult to Dr. Keturah, hospitalist, who is agreeable to admitting this patient to his service. I appreciate his collaboration in the care of this patient. [RS]    Clinical Course User Index [RS] Suleika Donavan, Pleasant SAUNDERS, PA-C                                 Medical Decision Making 33 y/o female with hyperglycemia in context of  DMT1, ESRD on HD.   HTN, Tachypneic on intake. Clinically fluid overloaded. GCS 15. Regarding the patient's labial pain, external exam revealed bilateral labial fullness in context of fluid overload with anasarca; no area of fluctuance, erythema, or disruption of the skin.  Clinical exam is not consistent with labial infection at this time, feel her pain is likely more related to her anasarca.  DDX includes hyperglycemia, DKA, HSS.  Amount and/or Complexity of Data Reviewed Labs: ordered.    Details: CBC with hbg at baseline of 10.9,  BMP with pseudohyponatremia in context of hyperglycemia of 827, hyperkalemia 5.5.  Anion gap of 18, CO2 19.  Creatinine at patient's baseline of 9.  Osmol 339, VBG with pH of 7.28 beta hydroxy 0.1. Radiology: ordered. ECG/medicine tests:     Details: EKG with NSR  Risk OTC drugs. Prescription drug management. Decision regarding hospitalization.    Patient will require admission to the hospital for [facility]; will also certainly require HD while inpatient.  Consult to nephrology as above. Consult to hospitalist pending.   Shley voiced understanding of her medical evaluation and treatment plan. Each of their  questions answered to their expressed satisfaction.  She is amenable to plan for admission at this time.   This chart was dictated using voice recognition software, Dragon. Despite the best efforts of this provider to proofread and correct errors, errors may still occur which can change documentation meaning.     Final diagnoses:  Hyperosmolar hyperglycemic state (HHS) [facility])  Other hypervolemia    ED Discharge Orders     None          Bobette Pleasant SAUNDERS DEVONNA 11/03/23 0002    Armenta Canning, MD 11/07/23 [phone]

## 2023-11-02 NOTE — ED Notes (Addendum)
 This nurse alerted EDP Pfeiffer, MD to patient's CBG reading greater than 600.

## 2023-11-03 ENCOUNTER — Inpatient Hospital Stay (HOSPITAL_COMMUNITY)

## 2023-11-03 ENCOUNTER — Other Ambulatory Visit (HOSPITAL_COMMUNITY)

## 2023-11-03 DIAGNOSIS — E1065 Type 1 diabetes mellitus with hyperglycemia: Secondary | ICD-10-CM | POA: Diagnosis present

## 2023-11-03 DIAGNOSIS — I5020 Unspecified systolic (congestive) heart failure: Secondary | ICD-10-CM

## 2023-11-03 DIAGNOSIS — E872 Acidosis, unspecified: Secondary | ICD-10-CM | POA: Diagnosis present

## 2023-11-03 DIAGNOSIS — K8689 Other specified diseases of pancreas: Secondary | ICD-10-CM | POA: Diagnosis present

## 2023-11-03 DIAGNOSIS — I132 Hypertensive heart and chronic kidney disease with heart failure and with stage 5 chronic kidney disease, or end stage renal disease: Secondary | ICD-10-CM | POA: Diagnosis present

## 2023-11-03 DIAGNOSIS — I5022 Chronic systolic (congestive) heart failure: Secondary | ICD-10-CM | POA: Diagnosis present

## 2023-11-03 DIAGNOSIS — Z79899 Other long term (current) drug therapy: Secondary | ICD-10-CM | POA: Diagnosis not present

## 2023-11-03 DIAGNOSIS — N186 End stage renal disease: Secondary | ICD-10-CM

## 2023-11-03 DIAGNOSIS — E10649 Type 1 diabetes mellitus with hypoglycemia without coma: Secondary | ICD-10-CM | POA: Diagnosis not present

## 2023-11-03 DIAGNOSIS — N9489 Other specified conditions associated with female genital organs and menstrual cycle: Secondary | ICD-10-CM | POA: Diagnosis not present

## 2023-11-03 DIAGNOSIS — E8779 Other fluid overload: Secondary | ICD-10-CM

## 2023-11-03 DIAGNOSIS — F319 Bipolar disorder, unspecified: Secondary | ICD-10-CM | POA: Diagnosis present

## 2023-11-03 DIAGNOSIS — Z794 Long term (current) use of insulin: Secondary | ICD-10-CM | POA: Diagnosis not present

## 2023-11-03 DIAGNOSIS — E1100 Type 2 diabetes mellitus with hyperosmolarity without nonketotic hyperglycemic-hyperosmolar coma (NKHHC): Secondary | ICD-10-CM | POA: Diagnosis not present

## 2023-11-03 DIAGNOSIS — E875 Hyperkalemia: Secondary | ICD-10-CM | POA: Diagnosis present

## 2023-11-03 DIAGNOSIS — R188 Other ascites: Secondary | ICD-10-CM | POA: Diagnosis present

## 2023-11-03 DIAGNOSIS — Z833 Family history of diabetes mellitus: Secondary | ICD-10-CM | POA: Diagnosis not present

## 2023-11-03 DIAGNOSIS — Z885 Allergy status to narcotic agent status: Secondary | ICD-10-CM | POA: Diagnosis not present

## 2023-11-03 DIAGNOSIS — R102 Pelvic and perineal pain: Secondary | ICD-10-CM

## 2023-11-03 DIAGNOSIS — K219 Gastro-esophageal reflux disease without esophagitis: Secondary | ICD-10-CM | POA: Diagnosis present

## 2023-11-03 DIAGNOSIS — D631 Anemia in chronic kidney disease: Secondary | ICD-10-CM | POA: Diagnosis present

## 2023-11-03 DIAGNOSIS — Z992 Dependence on renal dialysis: Secondary | ICD-10-CM

## 2023-11-03 DIAGNOSIS — Z881 Allergy status to other antibiotic agents status: Secondary | ICD-10-CM | POA: Diagnosis not present

## 2023-11-03 DIAGNOSIS — E1022 Type 1 diabetes mellitus with diabetic chronic kidney disease: Secondary | ICD-10-CM | POA: Diagnosis present

## 2023-11-03 DIAGNOSIS — I428 Other cardiomyopathies: Secondary | ICD-10-CM | POA: Diagnosis present

## 2023-11-03 DIAGNOSIS — Z560 Unemployment, unspecified: Secondary | ICD-10-CM | POA: Diagnosis not present

## 2023-11-03 DIAGNOSIS — E785 Hyperlipidemia, unspecified: Secondary | ICD-10-CM | POA: Diagnosis present

## 2023-11-03 DIAGNOSIS — E8770 Fluid overload, unspecified: Secondary | ICD-10-CM | POA: Insufficient documentation

## 2023-11-03 LAB — OSMOLALITY: Osmolality: 310 mosm/kg — ABNORMAL HIGH (ref 275–295)

## 2023-11-03 LAB — BASIC METABOLIC PANEL WITH GFR
Anion gap: 21 — ABNORMAL HIGH (ref 5–15)
BUN: 76 mg/dL — ABNORMAL HIGH (ref 6–20)
CO2: 15 mmol/L — ABNORMAL LOW (ref 22–32)
Calcium: 8.9 mg/dL (ref 8.9–10.3)
Chloride: 94 mmol/L — ABNORMAL LOW (ref 98–111)
Creatinine, Ser: 9.48 mg/dL — ABNORMAL HIGH (ref 0.44–1.00)
GFR, Estimated: 5 mL/min — ABNORMAL LOW (ref >60)
Glucose, Bld: 230 mg/dL — ABNORMAL HIGH (ref 70–99)
Potassium: 4.4 mmol/L (ref 3.5–5.1)
Sodium: 130 mmol/L — ABNORMAL LOW (ref 135–145)

## 2023-11-03 LAB — HEPATIC FUNCTION PANEL
ALT: 28 U/L (ref 0–44)
AST: 49 U/L — ABNORMAL HIGH (ref 15–41)
Albumin: 2.6 g/dL — ABNORMAL LOW (ref 3.5–5.0)
Alkaline Phosphatase: 472 U/L — ABNORMAL HIGH (ref 38–126)
Bilirubin, Direct: 0.2 mg/dL (ref 0.0–0.2)
Indirect Bilirubin: 0.6 mg/dL (ref 0.3–0.9)
Total Bilirubin: 0.8 mg/dL (ref 0.0–1.2)
Total Protein: 5.3 g/dL — ABNORMAL LOW (ref 6.5–8.1)

## 2023-11-03 LAB — GLUCOSE, CAPILLARY
Glucose-Capillary: 470 mg/dL — ABNORMAL HIGH (ref 70–99)
Glucose-Capillary: 382 mg/dL — ABNORMAL HIGH (ref 70–99)
Glucose-Capillary: 415 mg/dL — ABNORMAL HIGH (ref 70–99)
Glucose-Capillary: 320 mg/dL — ABNORMAL HIGH (ref 70–99)
Glucose-Capillary: 223 mg/dL — ABNORMAL HIGH (ref 70–99)
Glucose-Capillary: 212 mg/dL — ABNORMAL HIGH (ref 70–99)
Glucose-Capillary: 176 mg/dL — ABNORMAL HIGH (ref 70–99)
Glucose-Capillary: 172 mg/dL — ABNORMAL HIGH (ref 70–99)
Glucose-Capillary: 138 mg/dL — ABNORMAL HIGH (ref 70–99)
Glucose-Capillary: 167 mg/dL — ABNORMAL HIGH (ref 70–99)
Glucose-Capillary: 163 mg/dL — ABNORMAL HIGH (ref 70–99)
Glucose-Capillary: 131 mg/dL — ABNORMAL HIGH (ref 70–99)
Glucose-Capillary: 80 mg/dL (ref 70–99)
Glucose-Capillary: 113 mg/dL — ABNORMAL HIGH (ref 70–99)

## 2023-11-03 LAB — CBG MONITORING, ED
Glucose-Capillary: 596 mg/dL (ref 70–99)
Glucose-Capillary: >600 mg/dL (ref 70–99)

## 2023-11-03 LAB — CBC
HCT: 35 % — ABNORMAL LOW (ref 36.0–46.0)
Hemoglobin: 11.1 g/dL — ABNORMAL LOW (ref 12.0–15.0)
MCH: 27.8 pg (ref 26.0–34.0)
MCHC: 31.7 g/dL (ref 30.0–36.0)
MCV: 87.5 fL (ref 80.0–100.0)
Platelets: 367 K/uL (ref 150–400)
RBC: 4 MIL/uL (ref 3.87–5.11)
RDW: 15.1 % (ref 11.5–15.5)
WBC: 12.1 K/uL — ABNORMAL HIGH (ref 4.0–10.5)
nRBC: 0 % (ref 0.0–0.2)

## 2023-11-03 LAB — BETA-HYDROXYBUTYRIC ACID: Beta-Hydroxybutyric Acid: 0.08 mmol/L (ref 0.05–0.27)

## 2023-11-03 LAB — HEPATITIS B SURFACE ANTIGEN: Hepatitis B Surface Ag: NONREACTIVE

## 2023-11-03 LAB — MAGNESIUM: Magnesium: 2.5 mg/dL — ABNORMAL HIGH (ref 1.7–2.4)

## 2023-11-03 LAB — PHOSPHORUS: Phosphorus: 8.8 mg/dL — ABNORMAL HIGH (ref 2.5–4.6)

## 2023-11-03 LAB — MRSA NEXT GEN BY PCR, NASAL: MRSA by PCR Next Gen: NOT DETECTED

## 2023-11-03 MED ORDER — ATORVASTATIN CALCIUM 80 MG PO TABS
80.0000 mg | ORAL_TABLET | Freq: Every day | ORAL | Status: DC
  Administered 2023-11-03 – 2023-11-09 (×7): 80 mg via ORAL
  Filled 2023-11-03 (×7): qty 1

## 2023-11-03 MED ORDER — FAMOTIDINE 20 MG PO TABS
20.0000 mg | ORAL_TABLET | Freq: Every day | ORAL | Status: DC
  Administered 2023-11-03 – 2023-11-08 (×6): 20 mg via ORAL
  Filled 2023-11-03 (×6): qty 1

## 2023-11-03 MED ORDER — ONDANSETRON HCL 4 MG/2ML IJ SOLN
4.0000 mg | Freq: Four times a day (QID) | INTRAMUSCULAR | Status: DC | PRN
  Administered 2023-11-09: 4 mg via INTRAVENOUS
  Filled 2023-11-03: qty 2

## 2023-11-03 MED ORDER — NALOXONE HCL 0.4 MG/ML IJ SOLN: 0.4000 mg | INTRAMUSCULAR | Status: DC | PRN

## 2023-11-03 MED ORDER — DIPHENHYDRAMINE HCL 25 MG PO CAPS: 25.0000 mg | ORAL_CAPSULE | Freq: Four times a day (QID) | ORAL | Status: DC | PRN

## 2023-11-03 MED ORDER — INSULIN ASPART 100 UNIT/ML IJ SOLN
5.0000 [IU] | Freq: Three times a day (TID) | INTRAMUSCULAR | Status: DC
  Administered 2023-11-04: 5 [IU] via SUBCUTANEOUS

## 2023-11-03 MED ORDER — ACETAMINOPHEN 500 MG PO TABS
1000.0000 mg | ORAL_TABLET | Freq: Four times a day (QID) | ORAL | Status: DC | PRN
  Administered 2023-11-05: 1000 mg via ORAL
  Filled 2023-11-03: qty 2

## 2023-11-03 MED ORDER — OLANZAPINE 5 MG PO TBDP
5.0000 mg | ORAL_TABLET | Freq: Every day | ORAL | Status: DC
  Administered 2023-11-03 – 2023-11-08 (×7): 5 mg via ORAL
  Filled 2023-11-03 (×8): qty 1

## 2023-11-03 MED ORDER — LAMOTRIGINE 100 MG PO TABS
200.0000 mg | ORAL_TABLET | Freq: Every day | ORAL | Status: DC
  Administered 2023-11-03 – 2023-11-09 (×7): 200 mg via ORAL
  Filled 2023-11-03 (×7): qty 2

## 2023-11-03 MED ORDER — METOCLOPRAMIDE HCL 5 MG/ML IJ SOLN: 5.0000 mg | Freq: Three times a day (TID) | INTRAMUSCULAR | Status: DC

## 2023-11-03 MED ORDER — CARVEDILOL 12.5 MG PO TABS
12.5000 mg | ORAL_TABLET | Freq: Two times a day (BID) | ORAL | Status: DC
  Administered 2023-11-03 – 2023-11-08 (×10): 12.5 mg via ORAL
  Filled 2023-11-03 (×11): qty 1

## 2023-11-03 MED ORDER — DULOXETINE HCL 20 MG PO CPEP
20.0000 mg | ORAL_CAPSULE | Freq: Two times a day (BID) | ORAL | Status: DC
  Administered 2023-11-03 – 2023-11-09 (×12): 20 mg via ORAL
  Filled 2023-11-03 (×15): qty 1

## 2023-11-03 MED ORDER — MELATONIN 3 MG PO TABS
6.0000 mg | ORAL_TABLET | Freq: Every evening | ORAL | Status: DC | PRN
  Administered 2023-11-03 – 2023-11-09 (×5): 6 mg via ORAL
  Filled 2023-11-03 (×5): qty 2

## 2023-11-03 MED ORDER — HEPARIN SODIUM (PORCINE) 5000 UNIT/ML IJ SOLN: 5000.0000 [IU] | Freq: Three times a day (TID) | INTRAMUSCULAR | Status: DC

## 2023-11-03 MED ORDER — ONDANSETRON HCL 4 MG/2ML IJ SOLN: 4.0000 mg | Freq: Four times a day (QID) | INTRAMUSCULAR | Status: DC | PRN

## 2023-11-03 MED ORDER — PANCRELIPASE (LIP-PROT-AMYL) 3000-9500 UNITS PO CPEP
3000.0000 [IU] | ORAL_CAPSULE | Freq: Three times a day (TID) | ORAL | Status: DC
  Filled 2023-11-03 (×2): qty 1

## 2023-11-03 MED ORDER — HYDRALAZINE HCL 20 MG/ML IJ SOLN: 5.0000 mg | Freq: Four times a day (QID) | INTRAMUSCULAR | Status: DC | PRN

## 2023-11-03 MED ORDER — POLYETHYLENE GLYCOL 3350 17 G PO PACK: 17.0000 g | PACK | Freq: Every day | ORAL | Status: DC | PRN

## 2023-11-03 MED ORDER — BUMETANIDE 2 MG PO TABS
2.0000 mg | ORAL_TABLET | Freq: Every day | ORAL | Status: DC
  Administered 2023-11-03 – 2023-11-09 (×7): 2 mg via ORAL
  Filled 2023-11-03 (×8): qty 1

## 2023-11-03 MED ORDER — SODIUM CHLORIDE 0.9% FLUSH
3.0000 mL | Freq: Two times a day (BID) | INTRAVENOUS | Status: DC
  Administered 2023-11-03 – 2023-11-09 (×12): 3 mL via INTRAVENOUS

## 2023-11-03 MED ORDER — OXYCODONE HCL 5 MG PO TABS: 5.0000 mg | ORAL_TABLET | Freq: Four times a day (QID) | ORAL | Status: DC | PRN

## 2023-11-03 MED ORDER — INSULIN ASPART 100 UNIT/ML IJ SOLN
0.0000 [IU] | Freq: Three times a day (TID) | INTRAMUSCULAR | Status: DC
  Administered 2023-11-03: 1 [IU] via SUBCUTANEOUS
  Administered 2023-11-04: 2 [IU] via SUBCUTANEOUS
  Administered 2023-11-05: 1 [IU] via SUBCUTANEOUS
  Administered 2023-11-06 – 2023-11-07 (×2): 2 [IU] via SUBCUTANEOUS
  Administered 2023-11-07: 1 [IU] via SUBCUTANEOUS
  Administered 2023-11-08: 3 [IU] via SUBCUTANEOUS
  Administered 2023-11-09: 5 [IU] via SUBCUTANEOUS

## 2023-11-03 MED ORDER — OXYCODONE HCL 5 MG PO TABS: 2.5000 mg | ORAL_TABLET | Freq: Four times a day (QID) | ORAL | Status: DC | PRN

## 2023-11-03 MED ORDER — CALCITRIOL 0.5 MCG PO CAPS
1.2500 ug | ORAL_CAPSULE | ORAL | Status: DC
  Administered 2023-11-05 – 2023-11-07 (×2): 1.25 ug via ORAL
  Filled 2023-11-03 (×2): qty 1

## 2023-11-03 MED ORDER — INSULIN GLARGINE-YFGN 100 UNIT/ML ~~LOC~~ SOLN
10.0000 [IU] | Freq: Every day | SUBCUTANEOUS | Status: DC
  Administered 2023-11-03 – 2023-11-05 (×3): 10 [IU] via SUBCUTANEOUS
  Filled 2023-11-03 (×4): qty 0.1

## 2023-11-03 MED ORDER — DICYCLOMINE HCL 20 MG PO TABS: 20.0000 mg | ORAL_TABLET | Freq: Two times a day (BID) | ORAL | Status: DC | PRN

## 2023-11-03 MED ORDER — OXYCODONE HCL 5 MG PO TABS
5.0000 mg | ORAL_TABLET | Freq: Four times a day (QID) | ORAL | Status: DC | PRN
  Administered 2023-11-03 – 2023-11-05 (×4): 5 mg via ORAL
  Filled 2023-11-03 (×4): qty 1

## 2023-11-03 MED ORDER — OXYCODONE HCL 5 MG PO TABS: 5.0000 mg | ORAL_TABLET | Freq: Once | ORAL | Status: DC | PRN

## 2023-11-03 MED ORDER — ALBUTEROL SULFATE (2.5 MG/3ML) 0.083% IN NEBU: 2.5000 mg | INHALATION_SOLUTION | RESPIRATORY_TRACT | Status: DC | PRN

## 2023-11-03 MED ORDER — METOCLOPRAMIDE HCL 5 MG/ML IJ SOLN: 5.0000 mg | Freq: Three times a day (TID) | INTRAMUSCULAR | Status: DC | PRN

## 2023-11-03 MED ORDER — LOSARTAN POTASSIUM 50 MG PO TABS
150.0000 mg | ORAL_TABLET | Freq: Every day | ORAL | Status: DC
  Administered 2023-11-03 – 2023-11-09 (×7): 150 mg via ORAL
  Filled 2023-11-03 (×7): qty 3

## 2023-11-03 MED ORDER — SEVELAMER CARBONATE 800 MG PO TABS
2400.0000 mg | ORAL_TABLET | Freq: Three times a day (TID) | ORAL | Status: DC
  Administered 2023-11-03 – 2023-11-07 (×10): 2400 mg via ORAL
  Filled 2023-11-03 (×14): qty 3

## 2023-11-03 MED ORDER — SODIUM BICARBONATE 650 MG PO TABS
1300.0000 mg | ORAL_TABLET | Freq: Two times a day (BID) | ORAL | Status: DC
  Administered 2023-11-03 – 2023-11-09 (×13): 1300 mg via ORAL
  Filled 2023-11-03 (×13): qty 2

## 2023-11-03 MED ORDER — CHLORHEXIDINE GLUCONATE CLOTH 2 % EX PADS
6.0000 | MEDICATED_PAD | Freq: Every day | CUTANEOUS | Status: DC
  Administered 2023-11-03 – 2023-11-04 (×2): 6 via TOPICAL

## 2023-11-03 NOTE — Progress Notes (Signed)
 Called floor for pt HD delayed due to pt on insulin  gtt RN will call unit once pt is stable to come to unit

## 2023-11-03 NOTE — Plan of Care (Signed)

## 2023-11-03 NOTE — Progress Notes (Signed)
 Nephrology brief note:  Chart reviewed. Plan for HD in am. HD ordered. Full consult to follow.d/w HD nurse.  Dr. Dolan. [facility]

## 2023-11-03 NOTE — ED Notes (Signed)
 ED TO INPATIENT HANDOFF REPORT  ED Nurse Name and Phone #: Janeth RN [phone]  S Name/Age/Gender Ashley Freeman [place] 33 y.o. female Room/Bed: 021C/021C  Code Status   Code Status: Full Code  Home/SNF/Other Home Patient oriented to: self, place, time, and situation Is this baseline? Yes   Triage Complete: Triage complete  Chief Complaint Hyperosmolar hyperglycemic state (HHS) (HCC) [E11.00]  Triage Note Patient BIB [facility] from home for hyperglycemia, patient also missed dialysis today d/t issues with transportation. Patient usually T/TH/Sa, missed Th and today. Home glucometer read high, EMS reports patient appears fluid overloaded. VSS.   Allergies Allergies  Allergen Reactions   Cephalexin  Anaphylaxis and Other (See Comments)    Has gotten ceftriaxone  in the past    Morphine  Itching   Penicillins Hives and Rash   Fish Allergy Other (See Comments)    Allergic   Benadryl  [Diphenhydramine ] Itching   Dilaudid  [Hydromorphone ] Itching   Doxycycline  Itching   Methotrexate Derivatives Rash   Oxycodone  Itching    Level of Care/Admitting Diagnosis ED Disposition     ED Disposition  Admit   Condition  --   Comment  Hospital Area: [facility] [[id number]]  Level of Care: Progressive [102]  Admit to Progressive based on following criteria: GI, ENDOCRINE disease patients with GI bleeding, acute liver failure or pancreatitis, stable with diabetic ketoacidosis or thyrotoxicosis (hypothyroid) state.  May admit patient to [facility] or [facility] if equivalent level of care is available:: No  Covid Evaluation: Asymptomatic - no recent exposure (last 10 days) testing not required  Diagnosis: Hyperosmolar hyperglycemic state (HHS) [facility]) [[id number]]  Admitting Physician: SEGARS, JONATHAN [[id number]]  Attending Physician: SEGARS, JONATHAN [[id number]]  Certification:: I certify this patient will need inpatient services for at least 2 midnights  Expected  Medical Readiness: 11/06/2023          B Medical/Surgery History Past Medical History:  Diagnosis Date   Abscess, gluteal, right 08/24/2013   Anemia 02/19/2012   Bartholin's gland abscess 09/19/2013   Bipolar disorder (HCC)    BV (bacterial vaginosis) 11/24/2015   Depression    Diabetes mellitus type I (HCC) 2001   Diagnosed at age 50 ; Type I   Diarrhea 05/30/2016   DKA (diabetic ketoacidoses) 08/19/2013   Also in 2018   ESRD (end stage renal disease) (HCC)    Gonorrhea 08/2011   Treated in 09/2011   HFrEF (heart failure with reduced ejection fraction) (HCC)    a. 2022 Echo: EF 40%; b. 10/2021 Echo: EF 55%; b. 07/2022 MV: No ischemia. EF 31%; c. 08/2022 Echo: EF 35%, mildly dil RV, sev TR.   History of trichomoniasis 05/31/2016   Hyperlipidemia 03/28/2016   Hypertension    NICM (nonischemic cardiomyopathy) (HCC)    Sepsis (HCC) 09/19/2013   Past Surgical History:  Procedure Laterality Date   A/V FISTULAGRAM Right 06/17/2023   Procedure: A/V Fistulagram;  Surgeon: Marea Selinda RAMAN, MD;  Location: [facility] INVASIVE CV LAB;  Service: Cardiovascular;  Laterality: Right;   A/V SHUNT INTERVENTION N/A 09/25/2023   Procedure: A/V SHUNT INTERVENTION;  Surgeon: Pearline Norman RAMAN, MD;  Location: [facility] LAB;  Service: Cardiovascular;  Laterality: N/A;   AV FISTULA PLACEMENT Right 07/06/2022   Procedure: ARTERIOVENOUS GRAFT CREATION;  Surgeon: Gretta Lonni PARAS, MD;  Location: [facility] OR;  Service: Vascular;  Laterality: Right;   CESAREAN SECTION N/A 10/05/2019   Procedure: CESAREAN SECTION;  Surgeon: Izell Harari, MD;  Location: [facility] LD ORS;  Service: Obstetrics;  Laterality:  N/A;   CHOLECYSTECTOMY N/A 07/02/2023   Procedure: LAPAROSCOPIC CHOLECYSTECTOMY;  Surgeon: Ebbie Cough, MD;  Location: [facility] OR;  Service: General;  Laterality: N/A;   INCISION AND DRAINAGE ABSCESS Left 09/28/2019   Procedure: INCISION AND DRAINAGE VULVAR ABCESS;  Surgeon: Edsel Norleen GAILS, MD;  Location: [facility] OR;  Service:  Gynecology;  Laterality: Left;   INCISION AND DRAINAGE PERIRECTAL ABSCESS Right 08/18/2013   Procedure: IRRIGATION AND DEBRIDEMENT GLUTEAL ABSCESS;  Surgeon: Lynda Leos, MD;  Location: [facility] OR;  Service: General;  Laterality: Right;   INCISION AND DRAINAGE PERIRECTAL ABSCESS Right 09/19/2013   Procedure: IRRIGATION AND DEBRIDEMENT RIGHT GLUTEAL AND LABIAL ABSCESSES;  Surgeon: Lynda Leos, MD;  Location: [facility] OR;  Service: General;  Laterality: Right;   INCISION AND DRAINAGE PERIRECTAL ABSCESS Right 09/24/2013   Procedure: IRRIGATION AND DEBRIDEMENT PERIRECTAL ABSCESS;  Surgeon: Lynwood MALVA Pina, MD;  Location: [facility] OR;  Service: General;  Laterality: Right;   IR PARACENTESIS  08/28/2023     A IV Location/Drains/Wounds Patient Lines/Drains/Airways Status     Active Line/Drains/Airways     Name Placement date Placement time Site Days   Peripheral IV 11/02/23 20 G Anterior;Distal;Left;Upper Arm 11/02/23  2142  Arm  1   Fistula / Graft Right Upper arm Arteriovenous vein graft 07/06/22  1013  Upper arm  485            Intake/Output Last 24 hours No intake or output data in the 24 hours ending 11/03/23 0032  Labs/Imaging Results for orders placed or performed during the hospital encounter of 11/02/23 (from the past 48 hours)  CBG monitoring, ED     Status: Abnormal   Collection Time: 11/02/23  9:13 PM  Result Value Ref Range   Glucose-Capillary >600 (HH) 70 - 99 mg/dL    Comment: Glucose reference range applies only to samples taken after fasting for at least 8 hours.  Basic metabolic panel     Status: Abnormal   Collection Time: 11/02/23  9:40 PM  Result Value Ref Range   Sodium 127 (L) 135 - 145 mmol/L   Potassium 5.5 (H) 3.5 - 5.1 mmol/L   Chloride 90 (L) 98 - 111 mmol/L   CO2 19 (L) 22 - 32 mmol/L   Glucose, Bld 827 (HH) 70 - 99 mg/dL    Comment: CRITICAL RESULT CALLED TO, READ BACK BY AND VERIFIED WITH K. Melanie Openshaw, RN AT 2236 07.05.25 JLASIGAN Glucose reference range applies  only to samples taken after fasting for at least 8 hours.    BUN 77 (H) 6 - 20 mg/dL   Creatinine, Ser 0.71 (H) 0.44 - 1.00 mg/dL   Calcium  8.5 (L) 8.9 - 10.3 mg/dL   GFR, Estimated 5 (L) >60 mL/min    Comment: (NOTE) Calculated using the CKD-EPI Creatinine Equation (2021)    Anion gap 18 (H) 5 - 15    Comment: Performed at [facility], [address], [place], [place] [address]  Beta-hydroxybutyric acid     Status: None   Collection Time: 11/02/23  9:40 PM  Result Value Ref Range   Beta-Hydroxybutyric Acid 0.10 0.05 - 0.27 mmol/L    Comment: Performed at [facility], [address], [place], [place] [address]  CBC with Differential (PNL)     Status: Abnormal   Collection Time: 11/02/23  9:40 PM  Result Value Ref Range   WBC 10.2 4.0 - 10.5 K/uL   RBC 4.06 3.87 - 5.11 MIL/uL   Hemoglobin 10.9 (L) 12.0 -  15.0 g/dL   HCT 63.6 63.9 - 53.9 %   MCV 89.4 80.0 - 100.0 fL   MCH 26.8 26.0 - 34.0 pg   MCHC 30.0 30.0 - 36.0 g/dL   RDW 84.5 88.4 - 84.4 %   Platelets 309 150 - 400 K/uL    Comment: REPEATED TO VERIFY   nRBC 0.0 0.0 - 0.2 %   Neutrophils Relative % 62 %   Neutro Abs 6.4 1.7 - 7.7 K/uL   Lymphocytes Relative 19 %   Lymphs Abs 2.0 0.7 - 4.0 K/uL   Monocytes Relative 8 %   Monocytes Absolute 0.9 0.1 - 1.0 K/uL   Eosinophils Relative 9 %   Eosinophils Absolute 0.9 (H) 0.0 - 0.5 K/uL   Basophils Relative 1 %   Basophils Absolute 0.1 0.0 - 0.1 K/uL   Immature Granulocytes 1 %   Abs Immature Granulocytes 0.07 0.00 - 0.07 K/uL    Comment: Performed at [facility], [address] [place], [place] [address]  hCG, serum, qualitative     Status: None   Collection Time: 11/02/23  9:55 PM  Result Value Ref Range   Preg, Serum NEGATIVE NEGATIVE    Comment:        THE SENSITIVITY OF THIS METHODOLOGY IS >10 mIU/mL. Performed at [facility], [address] [place], [place] [address]   Osmolality     Status: Abnormal   Collection Time: 11/02/23  9:55  PM  Result Value Ref Range   Osmolality 339 (HH) 275 - 295 mOsm/kg    Comment: REPEATED TO VERIFY CRITICAL RESULT CALLED TO, READ BACK BY AND VERIFIED WITH: Ladarius Seubert,K RN 11/02/2023 2252 SKEEN,P Performed at [facility], [address] [place], [place] [address]   I-Stat Venous Blood Gas, ED     Status: Abnormal   Collection Time: 11/02/23 10:05 PM  Result Value Ref Range   pH, Ven 7.282 7.25 - 7.43   pCO2, Ven 45.9 44 - 60 mmHg   pO2, Ven 174 (H) 32 - 45 mmHg   Bicarbonate 21.7 20.0 - 28.0 mmol/L   TCO2 23 22 - 32 mmol/L   O2 Saturation 99 %   Acid-base deficit 5.0 (H) 0.0 - 2.0 mmol/L   Sodium 124 (L) 135 - 145 mmol/L   Potassium 5.4 (H) 3.5 - 5.1 mmol/L   Calcium , Ion 1.04 (L) 1.15 - 1.40 mmol/L   HCT 36.0 36.0 - 46.0 %   Hemoglobin 12.2 12.0 - 15.0 g/dL   Sample type VENOUS   CBG monitoring, ED     Status: Abnormal   Collection Time: 11/02/23 11:33 PM  Result Value Ref Range   Glucose-Capillary >600 (HH) 70 - 99 mg/dL    Comment: Glucose reference range applies only to samples taken after fasting for at least 8 hours.  CBG monitoring, ED     Status: Abnormal   Collection Time: 11/03/23 12:12 AM  Result Value Ref Range   Glucose-Capillary >600 (HH) 70 - 99 mg/dL    Comment: Glucose reference range applies only to samples taken after fasting for at least 8 hours.   DG Chest 2 View Result Date: 11/02/2023 CLINICAL DATA:  Shortness of breath EXAM: CHEST - 2 VIEW COMPARISON:  10/31/2023 FINDINGS: Shallow inspiration. Cardiac enlargement. No vascular congestion, edema, or consolidation. No pleural effusion or pneumothorax. Mediastinal contours appear intact. No significant changes. IMPRESSION: Cardiac enlargement.  No evidence of active pulmonary disease. Electronically Signed   By: Elsie  Mannie M.D.   On: 11/02/2023 23:35    Pending Labs Unresulted Labs (From admission, onward)     Start     Ordered   11/03/23 0500  CBC  Tomorrow morning,   R        11/03/23 0025    11/03/23 0500  Magnesium   Tomorrow morning,   R        11/03/23 0025   11/03/23 0500  Phosphorus  Tomorrow morning,   R        11/03/23 0025   11/03/23 0500  Hepatic function panel  Tomorrow morning,   R        11/03/23 0025   11/03/23 0020  Drug Screen 10 W/Conf, Serum  Add-on,   AD        11/03/23 0025   11/02/23 2249  Lipase, blood  Add-on,   AD        11/02/23 2248   11/02/23 2245  Hepatic function panel  Add-on,   AD        11/02/23 2244   11/02/23 2140  Basic metabolic panel  (Diabetes Ketoacidosis (DKA))  STAT Now then every 4 hours ,   STAT      11/02/23 2141   11/02/23 2140  Beta-hydroxybutyric acid  (Diabetes Ketoacidosis (DKA))  Now then every 4 hours,   STAT (with URGENT occurrences)      11/02/23 2141   11/02/23 2140  Urinalysis, Routine w reflex microscopic -Urine, Clean Catch  (Diabetes Ketoacidosis (DKA))  ONCE - STAT,   URGENT       Question:  Specimen Source  Answer:  Urine, Clean Catch   11/02/23 2141            Vitals/Pain Today's Vitals   11/02/23 2119 11/02/23 2127 11/02/23 2301 11/03/23 0006  BP:      Pulse: 91     Resp: (!) 23     Temp:      TempSrc:      SpO2: 97%     PainSc: 8  8  10-Worst pain ever 10-Worst pain ever    Isolation Precautions No active isolations  Medications Medications  insulin  regular, human (MYXREDLIN ) 100 units/ 100 mL infusion (5.5 Units/hr Intravenous New Bag/Given 11/02/23 2307)  lactated ringers  infusion ( Intravenous New Bag/Given 11/02/23 2308)  dextrose  5 % in lactated ringers  infusion (has no administration in time range)  dextrose  50 % solution 0-50 mL (has no administration in time range)  heparin  injection 5,000 Units (has no administration in time range)  sodium chloride  flush (NS) 0.9 % injection 3 mL (has no administration in time range)  acetaminophen  (TYLENOL ) tablet 1,000 mg (has no administration in time range)  albuterol  (PROVENTIL ) (2.5 MG/3ML) 0.083% nebulizer solution 2.5 mg (has no administration in  time range)  melatonin tablet 6 mg (has no administration in time range)  ondansetron  (ZOFRAN ) injection 4 mg (has no administration in time range)  polyethylene glycol (MIRALAX  / GLYCOLAX ) packet 17 g (has no administration in time range)  metoCLOPramide  (REGLAN ) injection 5 mg (has no administration in time range)  diphenhydrAMINE  (BENADRYL ) capsule 25 mg (has no administration in time range)  fentaNYL  (SUBLIMAZE ) injection 25 mcg (25 mcg Intravenous Given 11/02/23 2301)  fentaNYL  (SUBLIMAZE ) injection 50 mcg (50 mcg Intravenous Given 11/03/23 0006)  acetaminophen  (TYLENOL ) tablet 1,000 mg (1,000 mg Oral Given 11/03/23 0006)    Mobility walks     Focused Assessments GI assessment   R Recommendations: See Admitting Provider Note  Report  given to:   Additional Notes:

## 2023-11-03 NOTE — H&P (Addendum)
 History and Physical    Ashley Freeman:[id number] DOB: 1990-10-02 DOA: 11/02/2023  PCP: Keven Crumbly Pap, MD   Patient coming from: Home   Chief Complaint:  Chief Complaint  Patient presents with   Hyperglycemia    HPI:  Ashley Freeman is a 33 y.o. female with hx of Type 1 DM, with recurrent DKA/HHS, ESRD on TTS HD, HFrEF, bipolar disorder, frequent rehospitalizations ~ 1+ hospitalizations per month over the past year typically with volume overload, or diabetic emergencies, recently hospitalizated 6/27 - 7/1 with DKA and volume overload, and seen in ED on 7/3 after ground level falls and again felt to be in DKA but left against advice due to disagreement in pain regimen (patient requesting IV dilaudid  but workup for trauma negative), who presents due to missed HD sessions.   She reports that her medicaid transport agency failed to pick her up. Does not seem like she does any trouble shooting, contacting agency etc. Missed thurs and Saturday HD. Feels volume overloaded, LE edema, increased abd distension. Otherwise c/o vulvar pain and swelling. Without rashes or vaginal discharge. She has no other complaints at present, no SOB, chest pain. She reports adherence with insulin  therapy despite her uncontrolled DM and frequent rehospitalizations, denies any barriers to use, denies missed doses on my interview. Recent fill history appropriate, both lantus  and aspart last filled 4/21 x 90 days; additional aspart also filled in 5/'25.   Re: pain, c/o only vulvar pain. States that Fentanyl , is just making it worse. We discussed avoiding IV opiates as not currently indicated. She says that pills just don't work for me. But ultimately agreeable with plan.   Review of Systems:  ROS complete and negative except as marked above   Allergies  Allergen Reactions   Cephalexin  Anaphylaxis and Other (See Comments)    Has gotten ceftriaxone  in the past    Morphine  Itching    Penicillins Hives and Rash   Fish Allergy Other (See Comments)    Allergic   Benadryl  [Diphenhydramine ] Itching   Dilaudid  [Hydromorphone ] Itching   Doxycycline  Itching   Methotrexate Derivatives Rash   Oxycodone  Itching    Prior to Admission medications   Medication Sig Start Date End Date Taking? Authorizing Provider  atorvastatin  (LIPITOR ) 80 MG tablet Take 1 tablet (80 mg total) by mouth daily. 10/15/23  Yes Danford, Lonni SQUIBB, MD  bumetanide  (BUMEX ) 2 MG tablet Take 2 mg by mouth daily.   Yes [provider]  calcitRIOL  (ROCALTROL ) 0.25 MCG capsule Take 5 capsules (1.25 mcg total) by mouth every Tuesday, Thursday, and Saturday at 6 PM. 07/09/23  Yes Cindy Garnette POUR, MD  carvedilol  (COREG ) 12.5 MG tablet Take 1 tablet (12.5 mg total) by mouth 2 (two) times daily with a meal. 10/29/23  Yes Regalado, Belkys A, MD  Continuous Glucose Sensor (DEXCOM G7 SENSOR) MISC Change sensors every 10 days Patient taking differently: Inject 1 Device into the skin See admin instructions. Place 1 new sensor into the skin every 10 days 09/07/22  Yes Shamleffer, Ibtehal Jaralla, MD  cyclobenzaprine  (FLEXERIL ) 5 MG tablet Take 1 tablet (5 mg total) by mouth 3 (three) times daily as needed for muscle spasms. 08/31/23  Yes Ghimire, Donalda HERO, MD  dicyclomine  (BENTYL ) 20 MG tablet Take 20 mg by mouth 2 (two) times daily as needed for spasms.   Yes [provider]  DULoxetine  (CYMBALTA ) 20 MG capsule Take 20 mg by mouth 2 (two) times daily. 07/29/23  Yes [provider]  famotidine  (PEPCID ) 20 MG tablet Take 20 mg by mouth daily before breakfast. 07/29/23  Yes [provider]  insulin  aspart (NOVOLOG ) 100 UNIT/ML injection Inject 5 Units into the skin 3 (three) times daily with meals.   Yes [provider]  lamoTRIgine  (LAMICTAL ) 200 MG tablet Take 1 tablet (200 mg total) by mouth daily. 10/29/23  Yes Regalado, Belkys A, MD  LANTUS  SOLOSTAR 100 UNIT/ML Solostar Pen Inject  10 Units into the skin in the morning.   Yes [provider]  losartan  (COZAAR ) 100 MG tablet Take 150 mg by mouth daily. 03/25/23  Yes [provider]  metoCLOPramide  (REGLAN ) 10 MG tablet Take 1 tablet (10 mg total) by mouth every 8 (eight) hours as needed for nausea. 10/14/23  Yes Danford, Lonni SQUIBB, MD  OLANZapine  zydis (ZYPREXA ) 5 MG disintegrating tablet Take 5 mg by mouth at bedtime. 09/23/23 12/22/23 Yes [provider]  ondansetron  (ZOFRAN -ODT) 4 MG disintegrating tablet Take 1 tablet (4 mg total) by mouth every 8 (eight) hours as needed for nausea or vomiting. 06/23/23  Yes Davis, Jonathon H, MD  Pancrelipase , Lip-Prot-Amyl, 3000-9500 units CPEP Take 3,000 units of lipase by mouth 3 (three) times daily. 09/23/23 12/22/23 Yes [provider]  sevelamer  carbonate (RENVELA ) 800 MG tablet Take 2,400 mg by mouth 3 (three) times daily with meals.   Yes [provider]  sodium bicarbonate  650 MG tablet Take 1,300 mg by mouth 2 (two) times daily. 07/29/23  Yes [provider]  SUMAtriptan  (IMITREX ) 50 MG tablet Take 50 mg by mouth every 2 (two) hours as needed for migraine or headache. 06/20/22  Yes [provider]  albuterol  (VENTOLIN  HFA) 108 (90 Base) MCG/ACT inhaler Inhale 2 puffs into the lungs every 4 (four) hours as needed for wheezing or shortness of breath. Patient not taking: Reported on 11/02/2023 11/24/22   [provider]  prochlorperazine  (COMPAZINE ) 5 MG tablet Take 5 mg by mouth every 6 (six) hours as needed for nausea. 09/23/23   [provider]    Past Medical History:  Diagnosis Date   Abscess, gluteal, right 08/24/2013   Anemia 02/19/2012   Bartholin's gland abscess 09/19/2013   Bipolar disorder (HCC)    BV (bacterial vaginosis) 11/24/2015   Depression    Diabetes mellitus type I (HCC) 2001   Diagnosed at age 66 ; Type I   Diarrhea 05/30/2016   DKA (diabetic ketoacidoses) 08/19/2013   Also in 2018    ESRD (end stage renal disease) (HCC)    Gonorrhea 08/2011   Treated in 09/2011   HFrEF (heart failure with reduced ejection fraction) (HCC)    a. 2022 Echo: EF 40%; b. 10/2021 Echo: EF 55%; b. 07/2022 MV: No ischemia. EF 31%; c. 08/2022 Echo: EF 35%, mildly dil RV, sev TR.   History of trichomoniasis 05/31/2016   Hyperlipidemia 03/28/2016   Hypertension    NICM (nonischemic cardiomyopathy) (HCC)    Sepsis (HCC) 09/19/2013    Past Surgical History:  Procedure Laterality Date   A/V FISTULAGRAM Right 06/17/2023   Procedure: A/V Fistulagram;  Surgeon: Marea Selinda RAMAN, MD;  Location: [facility] INVASIVE CV LAB;  Service: Cardiovascular;  Laterality: Right;   A/V SHUNT INTERVENTION N/A 09/25/2023   Procedure: A/V SHUNT INTERVENTION;  Surgeon: Pearline Norman RAMAN, MD;  Location: [facility] LAB;  Service: Cardiovascular;  Laterality: N/A;   AV FISTULA PLACEMENT Right 07/06/2022   Procedure: ARTERIOVENOUS GRAFT CREATION;  Surgeon: Gretta Lonni PARAS, MD;  Location: [facility] OR;  Service:  Vascular;  Laterality: Right;   CESAREAN SECTION N/A 10/05/2019   Procedure: CESAREAN SECTION;  Surgeon: Izell Harari, MD;  Location: [facility] LD ORS;  Service: Obstetrics;  Laterality: N/A;   CHOLECYSTECTOMY N/A 07/02/2023   Procedure: LAPAROSCOPIC CHOLECYSTECTOMY;  Surgeon: Ebbie Cough, MD;  Location: [facility] OR;  Service: General;  Laterality: N/A;   INCISION AND DRAINAGE ABSCESS Left 09/28/2019   Procedure: INCISION AND DRAINAGE VULVAR ABCESS;  Surgeon: Edsel Norleen GAILS, MD;  Location: [facility] OR;  Service: Gynecology;  Laterality: Left;   INCISION AND DRAINAGE PERIRECTAL ABSCESS Right 08/18/2013   Procedure: IRRIGATION AND DEBRIDEMENT GLUTEAL ABSCESS;  Surgeon: Lynda Leos, MD;  Location: [facility] OR;  Service: General;  Laterality: Right;   INCISION AND DRAINAGE PERIRECTAL ABSCESS Right 09/19/2013   Procedure: IRRIGATION AND DEBRIDEMENT RIGHT GLUTEAL AND LABIAL ABSCESSES;  Surgeon: Lynda Leos, MD;  Location: [facility] OR;  Service:  General;  Laterality: Right;   INCISION AND DRAINAGE PERIRECTAL ABSCESS Right 09/24/2013   Procedure: IRRIGATION AND DEBRIDEMENT PERIRECTAL ABSCESS;  Surgeon: Lynwood MALVA Pina, MD;  Location: [facility] OR;  Service: General;  Laterality: Right;   IR PARACENTESIS  08/28/2023     reports that she has never smoked. She has never been exposed to tobacco smoke. She has never used smokeless tobacco. She reports that she does not currently use alcohol. She reports that she does not use drugs.  Family History  Problem Relation Age of Onset   Asthma Mother    Carpal tunnel syndrome Mother    Gout Father    Diabetes Paternal Grandmother    Anesthesia problems Neg Hx      Physical Exam: Vitals:   11/02/23 2117 11/02/23 2117 11/02/23 2118 11/02/23 2119  BP: (!) 171/100     Pulse: 90  89 91  Resp: (!) 24  10 (!) 23  Temp:  98.5 F (36.9 C)    TempSrc:  Oral    SpO2: 97%  97% 97%    Gen: Awake, alert, NAD   CV: Regular, normal S1, S2, 2/6 holosystolic murmur  Resp: Normal WOB, CTAB  Abd: distended and tense with dullness apart from central abd,  normoactive, mild diffuse tenderness.  GU: deferred as recently examined by ED provider.  MSK: Symmetric, 3+ edema up through thighs Skin: she has many escoriated lesions over the skin  Neuro: Alert and interactive  Psych: euthymic, appropriate    Data review:   Labs reviewed, notable for:   Blood glucose 827, VBG 7.28/45, bicarb 19, anion gap 18, beta hydroxybutyrate 0.1, serum osm 339.  Creatinine 9.2, history ESRD.  K5.5 hCG negative  Micro:  Results for orders placed or performed during the hospital encounter of 10/25/23  MRSA Next Gen by PCR, Nasal     Status: None   Collection Time: 10/25/23  4:57 PM   Specimen: Nasal Mucosa; Nasal Swab  Result Value Ref Range Status   MRSA by PCR Next Gen NOT DETECTED NOT DETECTED Final    Comment: (NOTE) The GeneXpert MRSA Assay (FDA approved for NASAL specimens only), is one component of a  comprehensive MRSA colonization surveillance program. It is not intended to diagnose MRSA infection nor to guide or monitor treatment for MRSA infections. Test performance is not FDA approved in patients less than 62 years old. Performed at [facility], [address], [place], [place] [address]     Imaging reviewed:  DG Chest 2 View Result Date: 11/02/2023 CLINICAL DATA:  Shortness of breath EXAM: CHEST - 2 VIEW  COMPARISON:  10/31/2023 FINDINGS: Shallow inspiration. Cardiac enlargement. No vascular congestion, edema, or consolidation. No pleural effusion or pneumothorax. Mediastinal contours appear intact. No significant changes. IMPRESSION: Cardiac enlargement.  No evidence of active pulmonary disease. Electronically Signed   By: Elsie Gravely M.D.   On: 11/02/2023 23:35    EKG:  Personally reviewed, sinus rhythm, PRWP, no acute ischemic changes  ED Course:  Started on insulin  drip with maintenance fluids per protocol.  Tylenol  and fentanyl  for vulvar pain.   Assessment/Plan:  33 y.o. female with hx Type 1 DM, with recurrent DKA/HHS, ESRD on TTS HD, HFrEF, HTN, bipolar disorder, frequent rehospitalizations ~ 1+ hospitalizations per month over the past year typically with volume overload, or diabetic emergencies, recently hospitalizated 6/27 - 7/1 with DKA and volume overload, and seen in ED on 7/3 after ground level falls and again felt to be in DKA but left against advice due to disagreement in pain regimen (patient requesting IV dilaudid  but workup for trauma negative), who presents due to missed HD sessions with volume overload, and found to be in HHS.   Volume overload related to missed HD Tense ascites, hx ? Cardiac ascites  Hx ESRD on TTS HD  Hx HFrEF, acute exacerbation 2/2 missed HD  Last HD 7/1. session Relates reason for missed HD secondary to transportation issues.  Appears she has social barrier here but poor coping skills and troubleshooting. Makes limited amount  of urine takes Bumex  2 mg daily. On RA. Diffuse LE edema and tense ascites noted.  - EDP consulted with nephrology, to see and arrange for HD  - IR evaluation for therapeutic paracentesis  - Resume home Bumex  tomorrow - Continue home calcitriol , sevelamer , bicarb - Notes she expresses that she will need to leave the hospital on Monday for teaching for home dialysis  HHS Blood glucose 827, VBG 7.28/45, bicarb 19, anion gap 18, beta hydroxybutyrate 0.1, serum osm 339. Acidosis likely related to renal disease as BOH butyrate normal. Cause of her HHS likely related to missed doses of insulin  despite her report, or inadequate insulin  dose. Home insulin  is Lantus  10 units, aspart 5 U TID with meals. Last A1c 10.3% in 4/'25  -Insulin  drip HHS protocol - Held on fluid bolus with ESRD, reduce maintenance fluid rates to 75 cc an hour of LR / D5LR -BMP q4 hr -K repletion if needed  -DM educator c/s  -N.p.o. except water   Vulvar pain, asymmetric R labial swelling, ? Bartholin gland cyst v abscess  - Per EDP exam no overlying skin changes or discharge, less concern for infection.  Deferred repeat GU exam as recently performed - Monitor for worsening course, can consider antibiotic or Gyn eval for drainage if concern for Bartholin cyst abscess -Symptomatic management pain control Tylenol  prn mild, heat, oxycodone  2.5/5 mg every 6 hours as needed for moderate/severe. Wean opiate.   Concern for drug seeking behavior.  Hx of recent AMA discharge from ED due to disagreement in pain regimen (patient requesting IV dilaudid  but workup for trauma negative). On last admit also requesting IV pain medications, as well as during [facility] admission. At present I do not see an indication for IV opiate therapy, and really would like to minimize opiates in general.  - Check serum drug screen  Opiate related pruritus - Benadryl  25 mg PO q 6 hr prn for itching. No IV benadryl  for this.   Chronic medical problems: HTN:  Continue home Coreg , losartan   Pancreatic insufficiency: continue home creon   Suspected  gastroparesis: Reglan  IV as needed for nausea.  Continue home Bentyl  GERD: Continue home famotidine . Bipolar disorder: Continue home olanzapine , lamotrigine , duloxetine     There is no height or weight on file to calculate BMI.    DVT prophylaxis:  SCDs Code Status:  Full Code Diet:  Diet Orders (From admission, onward)     Start     Ordered   11/03/23 0051  Diet NPO time specified Except for: Ice Chips, Sips with Meds, Other (See Comments)  (Hyperglycemic Hyperosmolar State (HHS))  Diet effective now       Comments: Ok for water   Question Answer Comment  Except for [employer]   Except for [employer] with Meds   Except for Other (See Comments)      11/03/23 0051           Family Communication:  None   Consults:  Nephrology   Admission status:   Inpatient, Step Down Unit  Severity of Illness: The appropriate patient status for this patient is INPATIENT. Inpatient status is judged to be reasonable and necessary in order to provide the required intensity of service to ensure the patient's safety. The patient's presenting symptoms, physical exam findings, and initial radiographic and laboratory data in the context of their chronic comorbidities is felt to place them at high risk for further clinical deterioration. Furthermore, it is not anticipated that the patient will be medically stable for discharge from the hospital within 2 midnights of admission.   * I certify that at the point of admission it is my clinical judgment that the patient will require inpatient hospital care spanning beyond 2 midnights from the point of admission due to high intensity of service, high risk for further deterioration and high frequency of surveillance required.*   Dorn Dawson, MD [facility]  How to contact the [facility] Attending or Consulting provider 7A - 7P or covering provider during after hours 7P -7A, for  this patient.  Check the care team in [facility] and look for a) attending/consulting [facility] provider listed and b) the [facility] team listed Log into [email or web address] and use [facility] universal password to access. If you do not have the password, please contact the hospital operator. Locate the [facility] provider you are looking for under [facility] and page to a number that you can be directly reached. If you still have difficulty reaching the provider, please page the [facility] (Director on Call) for the Hospitalists listed on amion for assistance.  11/03/2023, 12:55 AM

## 2023-11-03 NOTE — Progress Notes (Signed)
 Patient seen and examined.  On my exam she was looking comfortable.  Denied any nausea vomiting.  She was asking whether she can have Dilaudid .  We discussed about continuing oral pain medications.  Blood sugar was 170.  Patient denied any nausea vomiting and is able to eat.  In brief, 33 year old one of the multiple admissions with missed hemodialysis, she has type 1 diabetes on insulin  and recurrent hospitalization.  Recently admitted last week with DKA and volume overload, missed 2 dialysis sessions that patient attributed to the transportation not picking her up comes to the emergency room with abdominal distention and leg edema.  In the emergency room was also complaining of vulvar pain that was noted to be due to edema by ER physician, admitted with fluid overload.  Volume overload secondary to missed hemodialysis, ascites, ESRD: Currently hemodynamically stable.  Patient for hemodialysis today.  Also doing paracentesis for symptom relief.  HHS: Presented with blood glucose of 827.  She was on insulin  drip overnight.  Her blood glucose is 170 and she is able to eat regular diet.  Will transition her to home dose of long-acting insulin  10 units and prandial insulin  5 units 3 times daily with meals.  Known A1c of 10.3.  Anticipate patient will need few more sessions of subsequent dialysis before discharge.    Total time spent: 30 minutes.  Same-day admit.  No charge visit.

## 2023-11-03 NOTE — Progress Notes (Signed)
   11/03/23 1800  Vitals  Temp (!) 97.4 F (36.3 C)  Pulse Rate 86  Resp 18  BP (!) 163/102  SpO2 100 %  O2 Device Room Air  Weight 57.5 kg  Type of Weight Post-Dialysis  Post Treatment  Dialyzer Clearance Lightly streaked  Hemodialysis Intake (mL) 0 mL  Liters Processed 84  Fluid Removed (mL) 4000 mL  Tolerated HD Treatment Yes  AVG/AVF Arterial Site Held (minutes) 8 minutes  AVG/AVF Venous Site Held (minutes) 8 minutes   Received patient in bed to unit.  Alert and oriented.  Informed consent signed and in chart.   TX duration:3.5HRS  Patient tolerated well.  Transported back to the room  Alert, without acute distress.  Hand-off given to patient's nurse.   Access used: [email or web address] Access issues: NONE  Total UF removed: 4L Medication(s) given: NONE   [facility]

## 2023-11-03 NOTE — Consult Note (Addendum)
 [facility] Nephrology Consult Note: Reason for Consult: To manage dialysis and dialysis related needs Referring Physician: Dr. Raenelle Coria  HPI:  Ashley Freeman is an 33 y.o. female with past medical history significant for hypertension, bipolar mood disorder, type 1 diabetes with recurrent admission for DKA/HHS, ESRD on TTS presented with hyperglycemia, fluid overload in the setting of missing dialysis. The patient was recently admitted for DKA and volume overload at that time our team saw the patient.  The last dialysis seems to be on 6/29/225.  Patient stated she has not been to her outpatient dialysis center for about a week because of transportation issue.  In the ER, her blood pressure was elevated, tachycardic however remained in room air.  The labs showed sodium 127, blood sugar level in 800s, potassium 5.5.  This was started on DKA/HHS protocol including fluid and insulin .  I discontinued IV fluid this morning.  Patient reports shortness of breath and not feeling well..Chest x-ray with cardiac enlargement.  OP HD: TTS [facility] [address]  From April 2025 -> 3h  B350  57.5kjg  R AVG  heparin  1600 + 600 u/hr  Past Medical History:  Diagnosis Date   Abscess, gluteal, right 08/24/2013   Anemia 02/19/2012   Bartholin's gland abscess 09/19/2013   Bipolar disorder (HCC)    BV (bacterial vaginosis) 11/24/2015   Depression    Diabetes mellitus type I (HCC) 2001   Diagnosed at age 54 ; Type I   Diarrhea 05/30/2016   DKA (diabetic ketoacidoses) 08/19/2013   Also in 2018   ESRD (end stage renal disease) (HCC)    Gonorrhea 08/2011   Treated in 09/2011   HFrEF (heart failure with reduced ejection fraction) (HCC)    a. 2022 Echo: EF 40%; b. 10/2021 Echo: EF 55%; b. 07/2022 MV: No ischemia. EF 31%; c. 08/2022 Echo: EF 35%, mildly dil RV, sev TR.   History of trichomoniasis 05/31/2016   Hyperlipidemia 03/28/2016   Hypertension    NICM (nonischemic  cardiomyopathy) (HCC)    Sepsis (HCC) 09/19/2013    Past Surgical History:  Procedure Laterality Date   A/V FISTULAGRAM Right 06/17/2023   Procedure: A/V Fistulagram;  Surgeon: Marea Selinda RAMAN, MD;  Location: [facility] INVASIVE CV LAB;  Service: Cardiovascular;  Laterality: Right;   A/V SHUNT INTERVENTION N/A 09/25/2023   Procedure: A/V SHUNT INTERVENTION;  Surgeon: Pearline Norman RAMAN, MD;  Location: [facility] PV LAB;  Service: Cardiovascular;  Laterality: N/A;   AV FISTULA PLACEMENT Right 07/06/2022   Procedure: ARTERIOVENOUS GRAFT CREATION;  Surgeon: Gretta Lonni PARAS, MD;  Location: [facility] OR;  Service: Vascular;  Laterality: Right;   CESAREAN SECTION N/A 10/05/2019   Procedure: CESAREAN SECTION;  Surgeon: Izell Harari, MD;  Location: [facility] LD ORS;  Service: Obstetrics;  Laterality: N/A;   CHOLECYSTECTOMY N/A 07/02/2023   Procedure: LAPAROSCOPIC CHOLECYSTECTOMY;  Surgeon: Ebbie Cough, MD;  Location: [facility] OR;  Service: General;  Laterality: N/A;   INCISION AND DRAINAGE ABSCESS Left 09/28/2019   Procedure: INCISION AND DRAINAGE VULVAR ABCESS;  Surgeon: Edsel Norleen GAILS, MD;  Location: [facility] OR;  Service: Gynecology;  Laterality: Left;   INCISION AND DRAINAGE PERIRECTAL ABSCESS Right 08/18/2013   Procedure: IRRIGATION AND DEBRIDEMENT GLUTEAL ABSCESS;  Surgeon: Lynda Leos, MD;  Location: [facility] OR;  Service: General;  Laterality: Right;   INCISION AND DRAINAGE PERIRECTAL ABSCESS Right 09/19/2013   Procedure: IRRIGATION AND DEBRIDEMENT RIGHT GLUTEAL AND LABIAL ABSCESSES;  Surgeon: Lynda Leos, MD;  Location: [facility] OR;  Service:  General;  Laterality: Right;   INCISION AND DRAINAGE PERIRECTAL ABSCESS Right 09/24/2013   Procedure: IRRIGATION AND DEBRIDEMENT PERIRECTAL ABSCESS;  Surgeon: Lynwood MALVA Pina, MD;  Location: [facility] OR;  Service: General;  Laterality: Right;   IR PARACENTESIS  08/28/2023    Family History  Problem Relation Age of Onset   Asthma Mother    Carpal tunnel syndrome Mother    Gout Father     Diabetes Paternal Grandmother    Anesthesia problems Neg Hx     Social History:  reports that she has never smoked. She has never been exposed to tobacco smoke. She has never used smokeless tobacco. She reports that she does not currently use alcohol. She reports that she does not use drugs.  Allergies:  Allergies  Allergen Reactions   Cephalexin  Anaphylaxis and Other (See Comments)    Has gotten ceftriaxone  in the past    Morphine  Itching   Penicillins Hives and Rash   Fish Allergy Other (See Comments)    Allergic   Benadryl  [Diphenhydramine ] Itching   Dilaudid  [Hydromorphone ] Itching   Doxycycline  Itching   Methotrexate Derivatives Rash   Oxycodone  Itching    Medications: I have reviewed the patient's current medications.   Results for orders placed or performed during the hospital encounter of 11/02/23 (from the past 48 hours)  CBG monitoring, ED     Status: Abnormal   Collection Time: 11/02/23  9:13 PM  Result Value Ref Range   Glucose-Capillary >600 (HH) 70 - 99 mg/dL    Comment: Glucose reference range applies only to samples taken after fasting for at least 8 hours.  Basic metabolic panel     Status: Abnormal   Collection Time: 11/02/23  9:40 PM  Result Value Ref Range   Sodium 127 (L) 135 - 145 mmol/L   Potassium 5.5 (H) 3.5 - 5.1 mmol/L   Chloride 90 (L) 98 - 111 mmol/L   CO2 19 (L) 22 - 32 mmol/L   Glucose, Bld 827 (HH) 70 - 99 mg/dL    Comment: CRITICAL RESULT CALLED TO, READ BACK BY AND VERIFIED WITH K. CORUM, RN AT 2236 07.05.25 JLASIGAN Glucose reference range applies only to samples taken after fasting for at least 8 hours.    BUN 77 (H) 6 - 20 mg/dL   Creatinine, Ser 0.71 (H) 0.44 - 1.00 mg/dL   Calcium  8.5 (L) 8.9 - 10.3 mg/dL   GFR, Estimated 5 (L) >60 mL/min    Comment: (NOTE) Calculated using the CKD-EPI Creatinine Equation (2021)    Anion gap 18 (H) 5 - 15    Comment: Performed at [facility] Lab, [address] [place], [place]  [address]  Beta-hydroxybutyric acid     Status: None   Collection Time: 11/02/23  9:40 PM  Result Value Ref Range   Beta-Hydroxybutyric Acid 0.10 0.05 - 0.27 mmol/L    Comment: Performed at [facility] Lab, [address], [place], [place] [address]  CBC with Differential (PNL)     Status: Abnormal   Collection Time: 11/02/23  9:40 PM  Result Value Ref Range   WBC 10.2 4.0 - 10.5 K/uL   RBC 4.06 3.87 - 5.11 MIL/uL   Hemoglobin 10.9 (L) 12.0 - 15.0 g/dL   HCT 63.6 63.9 - 53.9 %   MCV 89.4 80.0 - 100.0 fL   MCH 26.8 26.0 - 34.0 pg   MCHC 30.0 30.0 - 36.0 g/dL   RDW 84.5 88.4 - 84.4 %   Platelets  309 150 - 400 K/uL    Comment: REPEATED TO VERIFY   nRBC 0.0 0.0 - 0.2 %   Neutrophils Relative % 62 %   Neutro Abs 6.4 1.7 - 7.7 K/uL   Lymphocytes Relative 19 %   Lymphs Abs 2.0 0.7 - 4.0 K/uL   Monocytes Relative 8 %   Monocytes Absolute 0.9 0.1 - 1.0 K/uL   Eosinophils Relative 9 %   Eosinophils Absolute 0.9 (H) 0.0 - 0.5 K/uL   Basophils Relative 1 %   Basophils Absolute 0.1 0.0 - 0.1 K/uL   Immature Granulocytes 1 %   Abs Immature Granulocytes 0.07 0.00 - 0.07 K/uL    Comment: Performed at [facility] Lab, [address], [place], [place] [address]  hCG, serum, qualitative     Status: None   Collection Time: 11/02/23  9:55 PM  Result Value Ref Range   Preg, Serum NEGATIVE NEGATIVE    Comment:        THE SENSITIVITY OF THIS METHODOLOGY IS >10 mIU/mL. Performed at [facility] Lab, [address], [place], [place] [address]   Osmolality     Status: Abnormal   Collection Time: 11/02/23  9:55 PM  Result Value Ref Range   Osmolality 339 (HH) 275 - 295 mOsm/kg    Comment: REPEATED TO VERIFY CRITICAL RESULT CALLED TO, READ BACK BY AND VERIFIED WITH: CORUM,K RN 11/02/2023 2252 SKEEN,P Performed at [facility] Lab, [address] [place], [place] [address]   I-Stat Venous Blood Gas, ED     Status: Abnormal   Collection Time: 11/02/23 10:05 PM  Result Value Ref Range    pH, Ven 7.282 7.25 - 7.43   pCO2, Ven 45.9 44 - 60 mmHg   pO2, Ven 174 (H) 32 - 45 mmHg   Bicarbonate 21.7 20.0 - 28.0 mmol/L   TCO2 23 22 - 32 mmol/L   O2 Saturation 99 %   Acid-base deficit 5.0 (H) 0.0 - 2.0 mmol/L   Sodium 124 (L) 135 - 145 mmol/L   Potassium 5.4 (H) 3.5 - 5.1 mmol/L   Calcium , Ion 1.04 (L) 1.15 - 1.40 mmol/L   HCT 36.0 36.0 - 46.0 %   Hemoglobin 12.2 12.0 - 15.0 g/dL   Sample type VENOUS   CBG monitoring, ED     Status: Abnormal   Collection Time: 11/02/23 11:33 PM  Result Value Ref Range   Glucose-Capillary >600 (HH) 70 - 99 mg/dL    Comment: Glucose reference range applies only to samples taken after fasting for at least 8 hours.  CBG monitoring, ED     Status: Abnormal   Collection Time: 11/03/23 12:12 AM  Result Value Ref Range   Glucose-Capillary >600 (HH) 70 - 99 mg/dL    Comment: Glucose reference range applies only to samples taken after fasting for at least 8 hours.  CBG monitoring, ED     Status: Abnormal   Collection Time: 11/03/23 12:46 AM  Result Value Ref Range   Glucose-Capillary 596 (HH) 70 - 99 mg/dL    Comment: Glucose reference range applies only to samples taken after fasting for at least 8 hours.   Comment 1 Notify RN    Comment 2 Document in Chart   Glucose, capillary     Status: Abnormal   Collection Time: 11/03/23  2:13 AM  Result Value Ref Range   Glucose-Capillary 470 (H) 70 - 99 mg/dL    Comment: Glucose reference range applies only to samples taken  after fasting for at least 8 hours.  Glucose, capillary     Status: Abnormal   Collection Time: 11/03/23  2:43 AM  Result Value Ref Range   Glucose-Capillary 415 (H) 70 - 99 mg/dL    Comment: Glucose reference range applies only to samples taken after fasting for at least 8 hours.  Glucose, capillary     Status: Abnormal   Collection Time: 11/03/23  3:21 AM  Result Value Ref Range   Glucose-Capillary 382 (H) 70 - 99 mg/dL    Comment: Glucose reference range applies only to samples  taken after fasting for at least 8 hours.  Glucose, capillary     Status: Abnormal   Collection Time: 11/03/23  4:14 AM  Result Value Ref Range   Glucose-Capillary 320 (H) 70 - 99 mg/dL    Comment: Glucose reference range applies only to samples taken after fasting for at least 8 hours.  Glucose, capillary     Status: Abnormal   Collection Time: 11/03/23  5:22 AM  Result Value Ref Range   Glucose-Capillary 223 (H) 70 - 99 mg/dL    Comment: Glucose reference range applies only to samples taken after fasting for at least 8 hours.  Beta-hydroxybutyric acid     Status: None   Collection Time: 11/03/23  5:36 AM  Result Value Ref Range   Beta-Hydroxybutyric Acid 0.08 0.05 - 0.27 mmol/L    Comment: Performed at [facility] Lab, [address] [place], [place] [address]  Hepatitis B surface antigen     Status: None   Collection Time: 11/03/23  5:37 AM  Result Value Ref Range   Hepatitis B Surface Ag NON REACTIVE NON REACTIVE    Comment: Performed at [facility] Lab, [address], [place], [place] [address]  Basic metabolic panel     Status: Abnormal   Collection Time: 11/03/23  5:39 AM  Result Value Ref Range   Sodium 130 (L) 135 - 145 mmol/L   Potassium 4.4 3.5 - 5.1 mmol/L    Comment: DELTA CHECK NOTED   Chloride 94 (L) 98 - 111 mmol/L   CO2 15 (L) 22 - 32 mmol/L   Glucose, Bld 230 (H) 70 - 99 mg/dL    Comment: Glucose reference range applies only to samples taken after fasting for at least 8 hours.   BUN 76 (H) 6 - 20 mg/dL   Creatinine, Ser 0.51 (H) 0.44 - 1.00 mg/dL   Calcium  8.9 8.9 - 10.3 mg/dL   GFR, Estimated 5 (L) >60 mL/min    Comment: (NOTE) Calculated using the CKD-EPI Creatinine Equation (2021)    Anion gap 21 (H) 5 - 15    Comment: ELECTROLYTES REPEATED TO VERIFY Performed at [facility] Lab, [address], [place], [place] [address]   CBC     Status: Abnormal   Collection Time: 11/03/23  5:39 AM  Result Value Ref Range   WBC 12.1 (H) 4.0 - 10.5 K/uL    RBC 4.00 3.87 - 5.11 MIL/uL   Hemoglobin 11.1 (L) 12.0 - 15.0 g/dL   HCT 64.9 (L) 63.9 - 53.9 %   MCV 87.5 80.0 - 100.0 fL   MCH 27.8 26.0 - 34.0 pg   MCHC 31.7 30.0 - 36.0 g/dL   RDW 84.8 88.4 - 84.4 %   Platelets 367 150 - 400 K/uL   nRBC 0.0 0.0 - 0.2 %    Comment: Performed at [facility] Lab, [address], [place],  [place] [address]  Magnesium      Status: Abnormal   Collection Time: 11/03/23  5:39 AM  Result Value Ref Range   Magnesium  2.5 (H) 1.7 - 2.4 mg/dL    Comment: Performed at [facility] Lab, [address] [place], [place] [address]  Phosphorus     Status: Abnormal   Collection Time: 11/03/23  5:39 AM  Result Value Ref Range   Phosphorus 8.8 (H) 2.5 - 4.6 mg/dL    Comment: Performed at [facility] Lab, [address], [place], [place] [address]  Hepatic function panel     Status: Abnormal   Collection Time: 11/03/23  5:39 AM  Result Value Ref Range   Total Protein 5.3 (L) 6.5 - 8.1 g/dL   Albumin  2.6 (L) 3.5 - 5.0 g/dL   AST 49 (H) 15 - 41 U/L   ALT 28 0 - 44 U/L   Alkaline Phosphatase 472 (H) 38 - 126 U/L   Total Bilirubin 0.8 0.0 - 1.2 mg/dL   Bilirubin, Direct 0.2 0.0 - 0.2 mg/dL   Indirect Bilirubin 0.6 0.3 - 0.9 mg/dL    Comment: Performed at [facility] Lab, [address] [place], [place] [address]  Osmolality     Status: Abnormal   Collection Time: 11/03/23  5:42 AM  Result Value Ref Range   Osmolality 310 (H) 275 - 295 mOsm/kg    Comment: Performed at [facility] Lab, [address] [place], [place] [address]  MRSA Next Gen by PCR, Nasal     Status: None   Collection Time: 11/03/23  5:51 AM   Specimen: Nasal Mucosa; Nasal Swab  Result Value Ref Range   MRSA by PCR Next Gen NOT DETECTED NOT DETECTED    Comment: (NOTE) The GeneXpert MRSA Assay (FDA approved for NASAL specimens only), is one component of a comprehensive MRSA colonization surveillance program. It is not intended to diagnose MRSA infection nor to guide or monitor  treatment for MRSA infections. Test performance is not FDA approved in patients less than 35 years old. Performed at [facility] Lab, [address], [place], [place] [address]   Glucose, capillary     Status: Abnormal   Collection Time: 11/03/23  6:27 AM  Result Value Ref Range   Glucose-Capillary 212 (H) 70 - 99 mg/dL    Comment: Glucose reference range applies only to samples taken after fasting for at least 8 hours.  Glucose, capillary     Status: Abnormal   Collection Time: 11/03/23  7:37 AM  Result Value Ref Range   Glucose-Capillary 176 (H) 70 - 99 mg/dL    Comment: Glucose reference range applies only to samples taken after fasting for at least 8 hours.  Glucose, capillary     Status: Abnormal   Collection Time: 11/03/23  8:38 AM  Result Value Ref Range   Glucose-Capillary 172 (H) 70 - 99 mg/dL    Comment: Glucose reference range applies only to samples taken after fasting for at least 8 hours.  Glucose, capillary     Status: Abnormal   Collection Time: 11/03/23  9:34 AM  Result Value Ref Range   Glucose-Capillary 138 (H) 70 - 99 mg/dL    Comment: Glucose reference range applies only to samples taken after fasting for at least 8 hours.  Glucose, capillary     Status: Abnormal   Collection Time: 11/03/23 10:37 AM  Result Value Ref Range   Glucose-Capillary 167 (H) 70 - 99 mg/dL  Comment: Glucose reference range applies only to samples taken after fasting for at least 8 hours.  Glucose, capillary     Status: Abnormal   Collection Time: 11/03/23 11:46 AM  Result Value Ref Range   Glucose-Capillary 163 (H) 70 - 99 mg/dL    Comment: Glucose reference range applies only to samples taken after fasting for at least 8 hours.  Glucose, capillary     Status: Abnormal   Collection Time: 11/03/23 12:58 PM  Result Value Ref Range   Glucose-Capillary 131 (H) 70 - 99 mg/dL    Comment: Glucose reference range applies only to samples taken after fasting for at least 8 hours.     US  ASCITES (ABDOMEN LIMITED) Result Date: 11/03/2023 CLINICAL DATA:  33 year old female with ascites. End stage renal disease on dialysis. 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 EXAM: LIMITED ABDOMEN ULTRASOUND FOR ASCITES TECHNIQUE: Limited ultrasound survey for ascites was performed in all four abdominal quadrants. COMPARISON:  CT Abdomen and Pelvis 08/27/2023 and earlier. FINDINGS: Grayscale images of all 4 quadrants demonstrate a moderate volume of ascites bilaterally. Appearance is very similar to the ultrasound on 10/13/2023 which proceeded ultrasound-guided paracentesis of 2.7 liters of fluid that day. IMPRESSION: Moderate volume recurrent Ascites, similar to 10/13/2023 pre-paracentesis ultrasound. Electronically Signed   By: VEAR Hurst M.D.   On: 11/03/2023 06:18   DG Chest 2 View Result Date: 11/02/2023 CLINICAL DATA:  Shortness of breath EXAM: CHEST - 2 VIEW COMPARISON:  10/31/2023 FINDINGS: Shallow inspiration. Cardiac enlargement. No vascular congestion, edema, or consolidation. No pleural effusion or pneumothorax. Mediastinal contours appear intact. No significant changes. IMPRESSION: Cardiac enlargement.  No evidence of active pulmonary disease. Electronically Signed   By: Elsie Gravely M.D.   On: 11/02/2023 23:35    ROS: As per H&P.  Rest reviewed and negative. Blood pressure 136/88, pulse 80, temperature 97.6 F (36.4 C), temperature source Oral, resp. rate 13, height 5' 3 (1.6 m), weight 61.4 kg, last menstrual period 01/29/2020, SpO2 96%. Gen: NAD, comfortable Respiratory: Clear bilateral, no wheezing or crackle Cardiovascular: Regular rate rhythm S1-S2 normal, no rubs GI: Abdomen soft, nontender,+distended Extremities: Lower extremities pitting edema present, no cyanosis  Skin: No rash or ulcer Neurology: Alert, awake, following commands Dialysis Access: AV fistula has good thrill and bruit.  Assessment/Plan:  # Volume overload due to  missed dialysis treatment associated with ascites: Plan for dialysis today.  Consulting IR for paracentesis.  # ESRD: TTS at [facility] in [place].  Missing dialysis frequently.  It seems like the last dialysis was about a week ago.  Patient reports that she misses HD due to transportation problem.  We will go ahead and do dialysis today, UF as tolerated.  Need to contact renal navigator tomorrow to help with outpatient transportation arrangement etc.  # Hypertension: UF with HD, resume home medication.  # Anemia of ESRD: Hemoglobin at goal, no need for erythropoietin.  # Metabolic Bone Disease/hyperphosphatemia: Continue sevelamer .  Need to be regular with dialysis and renal diet.  # Hyperkalemia: Managed with dialysis.  Thank you for the consult.  Will continue to follow.  Evonna Stoltz Amelie Romney 11/03/2023, 1:25 PM

## 2023-11-04 ENCOUNTER — Inpatient Hospital Stay (HOSPITAL_COMMUNITY)

## 2023-11-04 DIAGNOSIS — E1100 Type 2 diabetes mellitus with hyperosmolarity without nonketotic hyperglycemic-hyperosmolar coma (NKHHC): Secondary | ICD-10-CM | POA: Diagnosis not present

## 2023-11-04 HISTORY — PX: IR PARACENTESIS: IMG2679

## 2023-11-04 LAB — BODY FLUID CELL COUNT WITH DIFFERENTIAL
Eos, Fluid: 0 %
Lymphs, Fluid: 46 %
Monocyte-Macrophage-Serous Fluid: 23 % — ABNORMAL LOW (ref 50–90)
Neutrophil Count, Fluid: 31 % — ABNORMAL HIGH (ref 0–25)
Total Nucleated Cell Count, Fluid: 574 uL (ref 0–1000)

## 2023-11-04 LAB — GLUCOSE, CAPILLARY
Glucose-Capillary: 557 mg/dL (ref 70–99)
Glucose-Capillary: 203 mg/dL — ABNORMAL HIGH (ref 70–99)
Glucose-Capillary: 86 mg/dL (ref 70–99)
Glucose-Capillary: 45 mg/dL — ABNORMAL LOW (ref 70–99)
Glucose-Capillary: 149 mg/dL — ABNORMAL HIGH (ref 70–99)
Glucose-Capillary: 51 mg/dL — ABNORMAL LOW (ref 70–99)
Glucose-Capillary: 224 mg/dL — ABNORMAL HIGH (ref 70–99)

## 2023-11-04 LAB — GRAM STAIN: Gram Stain: NONE SEEN

## 2023-11-04 LAB — ALBUMIN, PLEURAL OR PERITONEAL FLUID: Albumin, Fluid: 1.6 g/dL

## 2023-11-04 LAB — PROTEIN, PLEURAL OR PERITONEAL FLUID: Total protein, fluid: <3 g/dL

## 2023-11-04 MED ORDER — DEXTROSE 50 % IV SOLN
25.0000 g | INTRAVENOUS | Status: AC
  Administered 2023-11-04: 25 g via INTRAVENOUS

## 2023-11-04 MED ORDER — CHLORHEXIDINE GLUCONATE CLOTH 2 % EX PADS
6.0000 | MEDICATED_PAD | Freq: Every day | CUTANEOUS | Status: DC
  Administered 2023-11-05 – 2023-11-08 (×4): 6 via TOPICAL

## 2023-11-04 NOTE — Progress Notes (Signed)
 Hypoglycemic Event  CBG: 51  Treatment: D50 50 mL (25 gm)  Symptoms: Sweaty  Follow-up CBG: Time:2215 CBG Result:224  Possible Reasons for Event: Inadequate meal intake      Crucita Lacorte D Lalania Haseman

## 2023-11-04 NOTE — Inpatient Diabetes Management (Signed)
 Inpatient Diabetes Program Recommendations  AACE/ADA: New Consensus Statement on Inpatient Glycemic Control (2015)  Target Ranges:  Prepandial:   less than 140 mg/dL      Peak postprandial:   less than 180 mg/dL (1-2 hours)      Critically ill patients:  140 - 180 mg/dL    Latest Reference Range & Units 11/02/23 21:40  Glucose 70 - 99 mg/dL 172 (HH)  (HH): Data is critically high  Latest Reference Range & Units 05/21/23 05:27 08/27/23 05:26  Hemoglobin A1C 4.8 - 5.6 % 10.6 (H) 10.3 (H)  248 mg/dl  (H): Data is abnormally high  Latest Reference Range & Units 11/03/23 06:27 11/03/23 07:37 11/03/23 08:38 11/03/23 09:34 11/03/23 10:37 11/03/23 11:46 11/03/23 12:58 11/03/23 18:30 11/03/23 21:20 11/04/23 06:41  Glucose-Capillary 70 - 99 mg/dL 787 (H)  IV Insulin  Drip Running 176 (H) 172 (H) 138 (H) 167 (H)  10 units Semglee  @1046  163 (H)  IV Insulin  Drip Stopped  1 unit Novolog   131 (H) 80 113 (H) 203 (H)  2 units Novolog    (H): Data is abnormally high   Admit with:  Hyperglycemia/ Volume overload related to missed HD  Recently hospitalizated 6/27 - 7/1 with DKA and volume overload Seen in ED on 7/3 after ground level falls and again felt to be in DKA but left against advice due to disagreement in pain regimen (patient requesting IV dilaudid  but workup for trauma negative)   History: Type 1 Diabetes, Recurrent DKA, ESRD  Home DM Meds: Dexcom G7 CGM       Lantus  10 units daily       Novolog  5 units TID  Current Orders: Semglee  10 units daily      Novolog  0-6 units TID      Novolog  5 units TID with meals     Well known to the Inpatient Diabetes Team due to frequent hospitalizations Leaves AMA sometimes Patient has had 9 admits in 2025 so far and has been seen multiple times by DM coordinator.  Unsure how to help patient with her DM.  Needs close follow-up with PCP and endocrinology.     --Will follow patient during hospitalization--  Adina Rudolpho Arrow RN,  MSN, CDCES Diabetes Coordinator Inpatient Glycemic Control Team Team Pager: [phone] (8a-5p)

## 2023-11-04 NOTE — Plan of Care (Signed)
   Problem: Education: Goal: Knowledge of General Education information will improve Description: Including pain rating scale, medication(s)/side effects and non-pharmacologic comfort measures Outcome: Progressing   Problem: Health Behavior/Discharge Planning: Goal: Ability to manage health-related needs will improve Outcome: Progressing   Problem: Clinical Measurements: Goal: Ability to maintain clinical measurements within normal limits will improve Outcome: Progressing Goal: Will remain free from infection Outcome: Progressing Goal: Diagnostic test results will improve Outcome: Progressing Goal: Respiratory complications will improve Outcome: Progressing Goal: Cardiovascular complication will be avoided Outcome: Progressing   Problem: Activity: Goal: Risk for activity intolerance will decrease Outcome: Progressing   Problem: Nutrition: Goal: Adequate nutrition will be maintained Outcome: Progressing   Problem: Coping: Goal: Level of anxiety will decrease Outcome: Progressing   Problem: Elimination: Goal: Will not experience complications related to bowel motility Outcome: Progressing Goal: Will not experience complications related to urinary retention Outcome: Progressing   Problem: Pain Managment: Goal: General experience of comfort will improve and/or be controlled Outcome: Progressing   Problem: Safety: Goal: Ability to remain free from injury will improve Outcome: Progressing   Problem: Skin Integrity: Goal: Risk for impaired skin integrity will decrease Outcome: Progressing   Problem: Education: Goal: Ability to describe self-care measures that may prevent or decrease complications (Diabetes Survival Skills Education) will improve Outcome: Progressing Goal: Individualized Educational Video(s) Outcome: Progressing   Problem: Coping: Goal: Ability to adjust to condition or change in health will improve Outcome: Progressing   Problem: Fluid  Volume: Goal: Ability to maintain a balanced intake and output will improve Outcome: Progressing   Problem: Health Behavior/Discharge Planning: Goal: Ability to identify and utilize available resources and services will improve Outcome: Progressing Goal: Ability to manage health-related needs will improve Outcome: Progressing   Problem: Metabolic: Goal: Ability to maintain appropriate glucose levels will improve Outcome: Progressing   Problem: Nutritional: Goal: Maintenance of adequate nutrition will improve Outcome: Progressing Goal: Progress toward achieving an optimal weight will improve Outcome: Progressing   Problem: Skin Integrity: Goal: Risk for impaired skin integrity will decrease Outcome: Progressing   Problem: Tissue Perfusion: Goal: Adequacy of tissue perfusion will improve Outcome: Progressing   Problem: Education: Goal: Ability to describe self-care measures that may prevent or decrease complications (Diabetes Survival Skills Education) will improve Outcome: Progressing Goal: Individualized Educational Video(s) Outcome: Progressing   Problem: Cardiac: Goal: Ability to maintain an adequate cardiac output will improve Outcome: Progressing   Problem: Health Behavior/Discharge Planning: Goal: Ability to identify and utilize available resources and services will improve Outcome: Progressing Goal: Ability to manage health-related needs will improve Outcome: Progressing   Problem: Fluid Volume: Goal: Ability to achieve a balanced intake and output will improve Outcome: Progressing   Problem: Metabolic: Goal: Ability to maintain appropriate glucose levels will improve Outcome: Progressing   Problem: Nutritional: Goal: Maintenance of adequate nutrition will improve Outcome: Progressing Goal: Maintenance of adequate weight for body size and type will improve Outcome: Progressing   Problem: Respiratory: Goal: Will regain and/or maintain adequate  ventilation Outcome: Progressing   Problem: Urinary Elimination: Goal: Ability to achieve and maintain adequate renal perfusion and functioning will improve Outcome: Progressing

## 2023-11-04 NOTE — Procedures (Signed)
 PROCEDURE SUMMARY:  Successful US  guided paracentesis from left lateral abdomen.  Yielded 1.5 liters of clear yellow fluid.  No immediate complications.  Patient tolerated well.  EBL = trace  Specimen was sent for labs.  SARI RAMAN Kodie Kishi PA-C 11/04/2023 2:34 PM

## 2023-11-04 NOTE — Progress Notes (Signed)
 [facility] Progress Note  Subjective:    Vitals:   11/04/23 0445 11/04/23 0614 11/04/23 0726 11/04/23 1156  BP: 136/81  132/80 121/72  Pulse:   83 78  Resp: 19  15 13   Temp: 97.8 F (36.6 C)  98.1 F (36.7 C) 97.9 F (36.6 C)  TempSrc: Oral  Oral Oral  SpO2: 99%  92% 94%  Weight:  77.6 kg    Height:        Exam: Gen: NAD, comfortable Respiratory: Clear bilateral, no wheezing or crackle Cardiovascular: Regular rate rhythm S1-S2 normal, no rubs GI: Abdomen soft, nontender,+distended Extremities: Lower extremities pitting edema present, no cyanosis  Skin: No rash or ulcer Neurology: Alert, awake, following commands Dialysis Access: AV fistula has good thrill and bruit.      OP HD: TTS [facility] [address]  From April 2025 -> 3h  B350  57.5kjg  R AVG  heparin  1600 + 600 u/hr   Assessment/Plan: Volume overload: due to missed dialysis treatment associated with ascites. Had HD yest w/ 4 L off. Close to dry wt today. Paracentesis a possibility, defer to pmd.  ESRD: TTS at [facility] in [place].  Missing dialysis frequently. Patient reported that she misses HD due to a transportation problem. Had HD here Sunday. Plan next HD Tuesday to get back on schedule.  Hypertension: resume home medication as needed per pmd. 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 Hyperkalemia: mild, resolved    Myer Fret MD  [facility] 11/04/2023, 3:49 PM  Recent Labs  Lab 10/31/23 2149 11/02/23 2140 11/02/23 2205 11/03/23 0539  HGB 10.5* 10.9* 12.2 11.1*  ALBUMIN  2.6*  --   --  2.6*  CALCIUM  8.4* 8.5*  --  8.9  PHOS  --   --   --  8.8*  CREATININE 8.81* 9.28*  --  9.48*  K 5.5* 5.5* 5.4* 4.4   No results for input(s): IRON , TIBC, FERRITIN in the last 168 hours. Inpatient medications:  atorvastatin   80 mg Oral Daily   bumetanide   2 mg Oral Daily   [START ON 11/05/2023] calcitRIOL   1.25 mcg Oral Q T,Th,Sat-1800   carvedilol   12.5 mg Oral BID WC    Chlorhexidine  Gluconate Cloth  6 each Topical Q0600   DULoxetine   20 mg Oral BID   famotidine   20 mg Oral QAC breakfast   insulin  aspart  0-6 Units Subcutaneous TID WC   insulin  aspart  5 Units Subcutaneous TID WC   insulin  glargine-yfgn  10 Units Subcutaneous Daily   lamoTRIgine   200 mg Oral Daily   losartan   150 mg Oral Daily   OLANZapine  zydis  5 mg Oral QHS   Pancrelipase  (Lip-Prot-Amyl)  3,000 Units Oral TID AC   sevelamer  carbonate  2,400 mg Oral TID WC   sodium bicarbonate   1,300 mg Oral BID   sodium chloride  flush  3 mL Intravenous Q12H    insulin  Stopped (11/03/23 1154)   acetaminophen , albuterol , dextrose , dicyclomine , diphenhydrAMINE , hydrALAZINE , melatonin, metoCLOPramide  (REGLAN ) injection, naLOXone  (NARCAN )  injection, ondansetron  (ZOFRAN ) IV, oxyCODONE  **OR** oxyCODONE , polyethylene glycol

## 2023-11-04 NOTE — TOC CM/SW Note (Signed)
 Transition of Care [facility]) - Inpatient Brief Assessment   Patient Details  Name: Ashley Freeman MRN: [id number] Date of Birth: 19-Apr-1991  Transition of [facility]) CM/SW Contact:    Lauraine FORBES Saa, LCSW Phone Number: 11/04/2023, 4:21 PM   Clinical Narrative:  4:21 PM Per chart review, patient recently discharged (10/29/2023) from [facility]. At the time, patient resided at home with mother and used [place] transportation. CSW provided additional transportation resources. Patient discharged home with rollator ([employer]) and does not have other DME. Patient has HH history with [facility]. Patient does not have SNF history. Patient's preferred pharmacy is [facility] [address].   Transition of Care Asessment: Insurance and Status: Insurance coverage has been reviewed Patient has primary care physician: Yes Home environment has been reviewed: Private Residence Prior level of function:: N/A Prior/Current Home Services: No current home services Social Drivers of Health Review: SDOH reviewed interventions complete Readmission risk has been reviewed: Yes Transition of care needs: no transition of care needs at this time

## 2023-11-04 NOTE — Progress Notes (Signed)
 PROGRESS NOTE    Ashley Freeman  FMW:[id number] DOB: 1991-02-01 DOA: 11/02/2023 PCP: Keven Crumbly Pap, MD    Brief Narrative:  33 year old one of the multiple admissions with missed hemodialysis, she has type 1 diabetes on insulin  and recurrent hospitalization. Recently admitted last week with DKA and volume overload, missed 2 dialysis sessions that patient attributed to the transportation not picking her up comes to the emergency room with abdominal distention and leg edema. In the emergency room was also complaining of vulvar pain that was noted to be due to edema by ER physician, admitted with fluid overload.   Subjective: Patient seen and examined.  She wants to go home today as she is planned to have an appointment for home dialysis scheduling.  Still has significantly bloated abdomen and vulvar swelling.  Blood sugars are better.  Assessment & Plan:   Volume overload secondary to missed hemodialysis, tense ascites: Missed dialysis due to transportation issues per patient. Received dialysis 7/6.  Patient will need further dialysis due to significant soft tissue edema and volume overload.  Nephrology following.  Tense ascites, pulmonary edema: Will ask IR to do paracentesis.  Edema with fluid management from dialysis.  YYD:Emzdzwuzi with blood glucose of 827.  Initially on insulin  drip.  Improved now.  Currently on home dose of long-acting insulin  10 units and prandial insulin  5 units 3 times daily with meals. Known A1c of 10.3.  Compliance is more pressing issue.   DVT prophylaxis: Place and maintain sequential compression device Start: 11/03/23 0046   Code Status: Full code Family Communication: None at bedside Disposition Plan: Status is: Inpatient Remains inpatient appropriate because: Significantly fluid overloaded     Consultants:  Nephrology  Procedures:  Routine hemodialysis  Antimicrobials:  None     Objective: Vitals:   11/03/23 2311  11/04/23 0445 11/04/23 0614 11/04/23 0726  BP: (!) 142/80 136/81  132/80  Pulse: 80   83  Resp: 16 19  15   Temp: 97.8 F (36.6 C) 97.8 F (36.6 C)  98.1 F (36.7 C)  TempSrc: Oral Oral  Oral  SpO2: 98% 99%  92%  Weight:   77.6 kg   Height:        Intake/Output Summary (Last 24 hours) at 11/04/2023 1104 Last data filed at 11/04/2023 0856 Gross per 24 hour  Intake 516.65 ml  Output 4000 ml  Net -3483.35 ml   Filed Weights   11/03/23 1410 11/03/23 1800 11/04/23 0614  Weight: 61.4 kg 57.5 kg 77.6 kg    Examination:  General exam: Appears calm and comfortable.  On room air. Respiratory system: Clear to auscultation. Respiratory effort normal.  No added sounds. Cardiovascular system: S1 & S2 heard, RRR.  Gastrointestinal system: Soft.  Nontender.  Tense and distended.  No rigidity or guarding. Central nervous system: Alert and oriented. No focal neurological deficits. Extremities: Symmetric 5 x 5 power. Tense edema of both legs. Edematous vulva, asymmetrical edema right more than left.    Data Reviewed: I have personally reviewed following labs and imaging studies  CBC: Recent Labs  Lab 10/31/23 2148 10/31/23 2149 11/02/23 2140 11/02/23 2205 11/03/23 0539  WBC  --  8.2 10.2  --  12.1*  NEUTROABS  --   --  6.4  --   --   HGB 11.9*  12.2 10.5* 10.9* 12.2 11.1*  HCT 35.0*  36.0 34.7* 36.3 36.0 35.0*  MCV  --  91.6 89.4  --  87.5  PLT  --  351 309  --  367   Basic Metabolic Panel: Recent Labs  Lab 10/28/23 2053 10/29/23 0257 10/31/23 2148 10/31/23 2149 11/02/23 2140 11/02/23 2205 11/03/23 0539  NA 134* 131* 126*  127* 129* 127* 124* 130*  K 4.4 4.8 5.4*  5.4* 5.5* 5.5* 5.4* 4.4  CL 96* 96* 94* 90* 90*  --  94*  CO2 21* 21*  --  19* 19*  --  15*  GLUCOSE 170* 204* >700* 864* 827*  --  230*  BUN 54* 55* 65* 74* 77*  --  76*  CREATININE 6.39* 6.68* 8.40* 8.81* 9.28*  --  9.48*  CALCIUM  8.3* 8.1*  --  8.4* 8.5*  --  8.9  MG  --   --   --   --   --   --   2.5*  PHOS  --   --   --   --   --   --  8.8*   GFR: Estimated Creatinine Clearance: 8.5 mL/min (A) (by C-G formula based on SCr of 9.48 mg/dL (H)). Liver Function Tests: Recent Labs  Lab 10/31/23 2149 11/03/23 0539  AST 24 49*  ALT 21 28  ALKPHOS 440* 472*  BILITOT 0.9 0.8  PROT 5.5* 5.3*  ALBUMIN  2.6* 2.6*   No results for input(s): LIPASE, AMYLASE in the last 168 hours. No results for input(s): AMMONIA in the last 168 hours. Coagulation Profile: No results for input(s): INR, PROTIME in the last 168 hours. Cardiac Enzymes: No results for input(s): CKTOTAL, CKMB, CKMBINDEX, TROPONINI in the last 168 hours. BNP (last 3 results) No results for input(s): PROBNP in the last 8760 hours. HbA1C: No results for input(s): HGBA1C in the last 72 hours. CBG: Recent Labs  Lab 11/03/23 1146 11/03/23 1258 11/03/23 1830 11/03/23 2120 11/04/23 0641  GLUCAP 163* 131* 80 113* 203*   Lipid Profile: No results for input(s): CHOL, HDL, LDLCALC, TRIG, CHOLHDL, LDLDIRECT in the last 72 hours. Thyroid  Function Tests: No results for input(s): TSH, T4TOTAL, FREET4, T3FREE, THYROIDAB in the last 72 hours. Anemia Panel: No results for input(s): VITAMINB12, FOLATE, FERRITIN, TIBC, IRON , RETICCTPCT in the last 72 hours. Sepsis Labs: No results for input(s): PROCALCITON, LATICACIDVEN in the last 168 hours.  Recent Results (from the past 240 hours)  MRSA Next Gen by PCR, Nasal     Status: None   Collection Time: 10/25/23  4:57 PM   Specimen: Nasal Mucosa; Nasal Swab  Result Value Ref Range Status   MRSA by PCR Next Gen NOT DETECTED NOT DETECTED Final    Comment: (NOTE) The GeneXpert MRSA Assay (FDA approved for NASAL specimens only), is one component of a comprehensive MRSA colonization surveillance program. It is not intended to diagnose MRSA infection nor to guide or monitor treatment for MRSA infections. Test performance is not  FDA approved in patients less than 65 years old. Performed at [facility], [address], [place], [place] [address]   MRSA Next Gen by PCR, Nasal     Status: None   Collection Time: 11/03/23  5:51 AM   Specimen: Nasal Mucosa; Nasal Swab  Result Value Ref Range Status   MRSA by PCR Next Gen NOT DETECTED NOT DETECTED Final    Comment: (NOTE) The GeneXpert MRSA Assay (FDA approved for NASAL specimens only), is one component of a comprehensive MRSA colonization surveillance program. It is not intended to diagnose MRSA infection nor to guide or monitor treatment for MRSA infections. Test performance is not FDA approved in patients less than 2  years old. Performed at [facility], [address], [place], [place] [address]          Radiology Studies: US  ASCITES (ABDOMEN LIMITED) Result Date: 11/03/2023 CLINICAL DATA:  33 year old female with ascites. End stage renal disease on dialysis. 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 EXAM: LIMITED ABDOMEN ULTRASOUND FOR ASCITES TECHNIQUE: Limited ultrasound survey for ascites was performed in all four abdominal quadrants. COMPARISON:  CT Abdomen and Pelvis 08/27/2023 and earlier. FINDINGS: Grayscale images of all 4 quadrants demonstrate a moderate volume of ascites bilaterally. Appearance is very similar to the ultrasound on 10/13/2023 which proceeded ultrasound-guided paracentesis of 2.7 liters of fluid that day. IMPRESSION: Moderate volume recurrent Ascites, similar to 10/13/2023 pre-paracentesis ultrasound. Electronically Signed   By: VEAR Hurst M.D.   On: 11/03/2023 06:18   DG Chest 2 View Result Date: 11/02/2023 CLINICAL DATA:  Shortness of breath EXAM: CHEST - 2 VIEW COMPARISON:  10/31/2023 FINDINGS: Shallow inspiration. Cardiac enlargement. No vascular congestion, edema, or consolidation. No pleural effusion or pneumothorax. Mediastinal contours appear intact. No significant changes. IMPRESSION:  Cardiac enlargement.  No evidence of active pulmonary disease. Electronically Signed   By: Elsie Gravely M.D.   On: 11/02/2023 23:35        Scheduled Meds:  atorvastatin   80 mg Oral Daily   bumetanide   2 mg Oral Daily   [START ON 11/05/2023] calcitRIOL   1.25 mcg Oral Q T,Th,Sat-1800   carvedilol   12.5 mg Oral BID WC   Chlorhexidine  Gluconate Cloth  6 each Topical Q0600   DULoxetine   20 mg Oral BID   famotidine   20 mg Oral QAC breakfast   insulin  aspart  0-6 Units Subcutaneous TID WC   insulin  aspart  5 Units Subcutaneous TID WC   insulin  glargine-yfgn  10 Units Subcutaneous Daily   lamoTRIgine   200 mg Oral Daily   losartan   150 mg Oral Daily   OLANZapine  zydis  5 mg Oral QHS   Pancrelipase  (Lip-Prot-Amyl)  3,000 Units Oral TID AC   sevelamer  carbonate  2,400 mg Oral TID WC   sodium bicarbonate   1,300 mg Oral BID   sodium chloride  flush  3 mL Intravenous Q12H   Continuous Infusions:  insulin  Stopped (11/03/23 1154)     LOS: 1 day    Time spent: 52 minutes    Renato Applebaum, MD [facility]

## 2023-11-05 DIAGNOSIS — E1100 Type 2 diabetes mellitus with hyperosmolarity without nonketotic hyperglycemic-hyperosmolar coma (NKHHC): Secondary | ICD-10-CM | POA: Diagnosis not present

## 2023-11-05 LAB — CBC
HCT: 33.4 % — ABNORMAL LOW (ref 36.0–46.0)
Hemoglobin: 10.6 g/dL — ABNORMAL LOW (ref 12.0–15.0)
MCH: 27.5 pg (ref 26.0–34.0)
MCHC: 31.7 g/dL (ref 30.0–36.0)
MCV: 86.8 fL (ref 80.0–100.0)
Platelets: 263 K/uL (ref 150–400)
RBC: 3.85 MIL/uL — ABNORMAL LOW (ref 3.87–5.11)
RDW: 15.2 % (ref 11.5–15.5)
WBC: 13 K/uL — ABNORMAL HIGH (ref 4.0–10.5)
nRBC: 0 % (ref 0.0–0.2)

## 2023-11-05 LAB — GLUCOSE, CAPILLARY
Glucose-Capillary: 162 mg/dL — ABNORMAL HIGH (ref 70–99)
Glucose-Capillary: 245 mg/dL — ABNORMAL HIGH (ref 70–99)
Glucose-Capillary: 182 mg/dL — ABNORMAL HIGH (ref 70–99)
Glucose-Capillary: 156 mg/dL — ABNORMAL HIGH (ref 70–99)
Glucose-Capillary: 188 mg/dL — ABNORMAL HIGH (ref 70–99)
Glucose-Capillary: 138 mg/dL — ABNORMAL HIGH (ref 70–99)

## 2023-11-05 LAB — RENAL FUNCTION PANEL
Albumin: 2.3 g/dL — ABNORMAL LOW (ref 3.5–5.0)
Anion gap: 14 (ref 5–15)
BUN: 58 mg/dL — ABNORMAL HIGH (ref 6–20)
CO2: 22 mmol/L (ref 22–32)
Calcium: 8.5 mg/dL — ABNORMAL LOW (ref 8.9–10.3)
Chloride: 95 mmol/L — ABNORMAL LOW (ref 98–111)
Creatinine, Ser: 7.6 mg/dL — ABNORMAL HIGH (ref 0.44–1.00)
GFR, Estimated: 7 mL/min — ABNORMAL LOW (ref >60)
Glucose, Bld: 242 mg/dL — ABNORMAL HIGH (ref 70–99)
Phosphorus: 7 mg/dL — ABNORMAL HIGH (ref 2.5–4.6)
Potassium: 5.3 mmol/L — ABNORMAL HIGH (ref 3.5–5.1)
Sodium: 131 mmol/L — ABNORMAL LOW (ref 135–145)

## 2023-11-05 MED ORDER — PANCRELIPASE (LIP-PROT-AMYL) 12000-38000 UNITS PO CPEP
12000.0000 [IU] | ORAL_CAPSULE | Freq: Every day | ORAL | Status: DC
  Administered 2023-11-06 – 2023-11-09 (×4): 12000 [IU] via ORAL
  Filled 2023-11-05 (×5): qty 1

## 2023-11-05 MED ORDER — HEPARIN SODIUM (PORCINE) 1000 UNIT/ML DIALYSIS: 1000.0000 [IU] | INTRAMUSCULAR | Status: DC | PRN

## 2023-11-05 MED ORDER — HYDROMORPHONE HCL 1 MG/ML IJ SOLN
0.5000 mg | INTRAMUSCULAR | Status: AC | PRN
  Administered 2023-11-05 – 2023-11-06 (×4): 0.5 mg via INTRAVENOUS
  Filled 2023-11-05 (×4): qty 0.5

## 2023-11-05 MED ORDER — HEPARIN SODIUM (PORCINE) 1000 UNIT/ML DIALYSIS: 20.0000 [IU]/kg | INTRAMUSCULAR | Status: DC | PRN

## 2023-11-05 MED ORDER — HEPARIN SODIUM (PORCINE) 1000 UNIT/ML DIALYSIS: 2500.0000 [IU] | Freq: Once | INTRAMUSCULAR | Status: DC

## 2023-11-05 MED ORDER — OXYCODONE HCL 5 MG PO TABS
10.0000 mg | ORAL_TABLET | Freq: Four times a day (QID) | ORAL | Status: DC | PRN
  Administered 2023-11-05 – 2023-11-06 (×2): 10 mg via ORAL
  Filled 2023-11-05 (×2): qty 2

## 2023-11-05 MED ORDER — HYDROMORPHONE HCL 1 MG/ML IJ SOLN
0.5000 mg | Freq: Once | INTRAMUSCULAR | Status: AC | PRN
  Administered 2023-11-05: 0.5 mg via INTRAVENOUS
  Filled 2023-11-05: qty 0.5

## 2023-11-05 NOTE — Plan of Care (Signed)

## 2023-11-05 NOTE — Progress Notes (Signed)
   11/05/23 1315  Vitals  Temp 98.4 F (36.9 C)  Pulse Rate 88  Resp 13  BP (!) 153/83  SpO2 100 %  O2 Device Room Air  Weight 72.4 kg (STANDING)  Type of Weight Post-Dialysis  Oxygen Therapy  Patient Activity (if Appropriate) In bed  Pulse Oximetry Type Continuous  Oximetry Probe Site Changed No  Post Treatment  Dialyzer Clearance Lightly streaked  Hemodialysis Intake (mL) 0 mL  Liters Processed 63  Fluid Removed (mL) 4000 mL  Tolerated HD Treatment Yes   Received patient in bed to unit.  Alert and oriented.  Informed consent signed and in chart.   TX duration:3HRS  Patient tolerated well.  Transported back to the room  Alert, without acute distress.  Hand-off given to patient's nurse.   Access used: RAVG Access issues: NONE  Total UF removed: 4L Medication(s) given: NONE   Ashley Freeman [facility]

## 2023-11-05 NOTE — Progress Notes (Signed)
 Patient labia swollen, painful to touch. Patient has tried heat pack, elevating at the hip and 5 mg of PO oxy without any relief. Patient cannot get in a comfortable position for pain and pressure relief. Paged MD regarding pain management.

## 2023-11-05 NOTE — TOC Initial Note (Signed)
 Transition of Care [facility]) - Initial/Assessment Note    Patient Details  Name: Ashley Freeman MRN: [id number] Date of Birth: [birth year]  Transition of Care [facility]) CM/SW Contact:    Lauraine FORBES Saa, LCSW Phone Number: 11/05/2023, 3:09 PM  Clinical Narrative:                  3:09 PM Patient confirmed that she resides at home with mother and minor child. Patient confirmed mother is able to provide transportation upon discharge. Patient confirmed she uses Medicaid transportation for appointments and that service has been successful. Patient confirmed that she does not have SNF history. Patient confirmed [facility] history ([facility]). Patient stated that she would be interested in SNF or [facility] upon discharge and that she did not receive rollator through [facility] from most recent discharge. CSW relayed information to medical team.  Expected Discharge Plan: Home/Self Care Barriers to Discharge: Continued Medical Work up   Patient Goals and CMS Choice Patient states their goals for this hospitalization and ongoing recovery are:: to return home          Expected Discharge Plan and Services In-house Referral: Clinical Social Work     Living arrangements for the past 2 months: Apartment                                      Prior Living Arrangements/Services Living arrangements for the past 2 months: Apartment Lives with:: Minor Children, Parents Patient language and need for interpreter reviewed:: Yes Do you feel safe going back to the place where you live?: Yes      Need for Family Participation in Patient Care: No (Comment) Care giver support system in place?: Yes (comment)   Criminal Activity/Legal Involvement Pertinent to Current Situation/Hospitalization: No - Comment as needed  Activities of Daily Living   ADL Screening (condition at time of admission) Independently performs ADLs?: No Does the patient have a NEW difficulty with bathing/dressing/toileting/self-feeding  that is expected to last >3 days?: Yes (Initiates electronic notice to provider for possible OT consult) Does the patient have a NEW difficulty with getting in/out of bed, walking, or climbing stairs that is expected to last >3 days?: Yes (Initiates electronic notice to provider for possible PT consult) Does the patient have a NEW difficulty with communication that is expected to last >3 days?: No Is the patient deaf or have difficulty hearing?: No Does the patient have difficulty seeing, even when wearing glasses/contacts?: No Does the patient have difficulty concentrating, remembering, or making decisions?: No  Permission Sought/Granted Permission sought to share information with : Family Supports Permission granted to share information with : No (Contact information on chart)  Share Information with NAME: Rutherford Berber     Permission granted to share info w Relationship: Mother  Permission granted to share info w Contact Information: [phone]  Emotional Assessment Appearance:: Appears stated age Attitude/Demeanor/Rapport: Engaged Affect (typically observed): Accepting, Adaptable, Stable, Pleasant, Appropriate, Calm Orientation: : Oriented to Self, Oriented to Place, Oriented to  Time, Oriented to Situation Alcohol / Substance Use: Not Applicable Psych Involvement: No (comment)  Admission diagnosis:  Other hypervolemia [E87.79] Hyperosmolar hyperglycemic state (HHS) (HCC) [E11.00] Patient Active Problem List   Diagnosis Date Noted   Hyperosmolar hyperglycemic state (HHS) (HCC) 11/03/2023   Volume overload 11/03/2023   Vulvar pain 11/03/2023   Generalized abdominal pain 08/27/2023   Elevated LFTs 07/20/2023   Cholecystitis 06/30/2023  Prolonged QT interval 06/30/2023   Bipolar disorder (HCC) 06/30/2023   Hyperglycemic crisis due to Type 1 diabetes mellitus (HCC) 05/20/2023   HFrEF (heart failure with reduced ejection fraction) (HCC) 05/20/2023   Chronic combined  systolic and diastolic heart failure (HCC) 05/20/2023   Generalized pain 05/20/2023   Hypoglycemia 09/08/2022   Dilated cardiomyopathy (HCC) 09/08/2022   Seizures (HCC) 09/08/2022   ESRD on hemodialysis (HCC) 09/08/2022   Altered mental status 09/07/2022   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) 05/26/2021   Dyslipidemia 05/26/2021   Anasarca 04/16/2021   Mild protein malnutrition (HCC) 03/07/2021   DKA, type 1 (HCC) 12/08/2020   Type 1 diabetes mellitus (HCC) 02/08/2020   Systolic ejection murmur 08/03/2019   Type 1 diabetes mellitus with hyperglycemia (HCC) 07/24/2019   Diabetic retinopathy (HCC) 07/02/2019   Proteinuria due to type 1 diabetes mellitus (HCC) 05/21/2019   Diarrhea 05/31/2016   Diabetic neuropathy (HCC) 01/16/2016   Preop cardiovascular exam 08/24/2013   Pseudohyponatremia 08/04/2012   Hypokalemia 05/07/2012   Lactic acidosis 02/19/2012   Leukocytosis 02/19/2012   Normocytic anemia 02/19/2012   Hyperkalemia 02/19/2012   Stable proliferative diabetic retinopathy associated with type 1 diabetes mellitus (HCC) 10/13/1997   PCP:  Keven Crumbly Pap, MD Pharmacy:   [facility] - [place] - [address] [place]) [place] [address] Phone: [phone] Fax: [phone]     Social Drivers of Health (SDOH) Social History: SDOH  Screenings   Food Insecurity: No Food Insecurity (11/03/2023)  Housing: Low Risk  (11/03/2023)  Transportation Needs: Unmet Transportation Needs (11/03/2023)  Utilities: Not At Risk (11/03/2023)  Depression (PHQ2-9): Medium Risk (03/02/2020)  Financial Resource Strain: Low Risk  (09/18/2023)   [facility]  Physical Activity: Insufficiently Active (03/02/2022)   Received from [facility]  Social Connections: Moderately Integrated (08/28/2023)  Stress: No Stress Concern Present (03/08/2023)   Received from [facility]  Tobacco Use: Low Risk  (11/02/2023)   SDOH Interventions: Transportation Interventions: Community Resources Provided, Inpatient TOC   Readmission Risk Interventions    10/29/2023   10:23 AM 07/23/2023    4:25 PM 07/03/2023   10:14 AM  Readmission Risk Prevention Plan  Transportation Screening Complete Complete Complete  Medication Review [employer]) Complete Referral to Pharmacy Complete  PCP or Specialist appointment within 3-5 days of discharge Complete Complete Complete  HRI or Home Care Consult Complete Complete Complete  SW Recovery Care/Counseling Consult Complete Complete Complete  Palliative Care Screening Not Applicable Not Applicable Not Applicable  Skilled Nursing Facility Not Applicable Not Applicable Not Applicable

## 2023-11-05 NOTE — Progress Notes (Signed)
 Pt receives out-pt HD at [facility] on [address] on TTS 12:00 pm chair time. Clinic to fax over pt's HD info to nephrologist for review. Will assist as needed.   Randine Mungo Renal Navigator [phone]

## 2023-11-05 NOTE — Inpatient Diabetes Management (Addendum)
 Inpatient Diabetes Program Recommendations  AACE/ADA: New Consensus Statement on Inpatient Glycemic Control (2015)  Target Ranges:  Prepandial:   less than 140 mg/dL      Peak postprandial:   less than 180 mg/dL (1-2 hours)      Critically ill patients:  140 - 180 mg/dL    Latest Reference Range & Units 11/04/23 06:41 11/04/23 11:25 11/04/23 16:19 11/04/23 16:46 11/04/23 20:56 11/04/23 22:15  Glucose-Capillary 70 - 99 mg/dL 796 (H)  2 units Novolog  @0652   5 units Novolog  @0914   10 units Semglee  @0913  86 45 (L) 149 (H) 51 (L) 224 (H)  (H): Data is abnormally high (L): Data is abnormally low  Latest Reference Range & Units 11/05/23 05:59  Glucose-Capillary 70 - 99 mg/dL 837 (H)  (H): Data is abnormally high   History: Type 1 Diabetes, Recurrent DKA, ESRD   Home DM Meds: Dexcom G7 CGM                             Lantus  10 units daily                             Novolog  5 units TID   Current Orders: Semglee  10 units daily                            Novolog  0-6 units TID                            Novolog  5 units TID with meals    MD- Note Severe Hypoglycemia yesterday afternoon Suspect the Novolog  pt received earlier in the day may have caused the low CBG 162 this AM  Please consider: Reduce the Novolog  Meal Coverage to 3 units TID with meals    Well known to the Inpatient Diabetes Team due to frequent hospitalizations Leaves AMA sometimes Patient has had 9 admits in 2025 so far and has been seen multiple times by DM coordinator.  Unsure how to help patient with her DM.  Needs close follow-up with PCP and endocrinology.    --Will follow patient during hospitalization--  Adina Rudolpho Arrow RN, MSN, CDCES Diabetes Coordinator Inpatient Glycemic Control Team Team Pager: [phone] (8a-5p)

## 2023-11-05 NOTE — Progress Notes (Addendum)
 Ashley Freeman Progress Note  Subjective:  Seen in HD unit  No c/o's  Vitals:   11/05/23 1030 11/05/23 1100 11/05/23 1130 11/05/23 1200  BP: 113/72 (!) 126/109 (!) 141/76 (!) 141/76  Pulse: 81 88 85 85  Resp: 15 15 16 15   Temp:      TempSrc:      SpO2: 95% 97% 96% 99%  Weight:      Height:        Exam: Gen: NAD, comfortable Respiratory: Clear bilateral, no rales Cardiovascular: Regular rate rhythm S1-S2 normal, no rubs GI: Abdomen soft, nontender,+distended Extremities: 1+ LE edema Neurology: Alert, awake, following commands   AV fistula+t/b      OP HD: TTS [facility] [address] 3h  60kg   B350  AVG   Heparin  1600 + 600u/hr  Assessment/Plan: Volume overload: due to missed dialysis treatment associated with ascites. Had HD 7/06 w/ 4 L off. Still LE edema. Need standing wt. Paracentesis w/ only 1.5 L removed. Standing wt post HD today was 72.4kg which is 12 kg over.  ESRD: TTS at [facility] in [place].  Missing dialysis frequently. Patient reported that she misses HD due to a transportation problem. HD today, max UF.  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   Myer Fret MD  [facility] 11/05/2023, 12:16 PM  Recent Labs  Lab 11/03/23 0539 11/05/23 0806  HGB 11.1* 10.6*  ALBUMIN  2.6* 2.3*  CALCIUM  8.9 8.5*  PHOS 8.8* 7.0*  CREATININE 9.48* 7.60*  K 4.4 5.3*   No results for input(s): IRON , TIBC, FERRITIN in the last 168 hours. Inpatient medications:  atorvastatin   80 mg Oral Daily   bumetanide   2 mg Oral Daily   calcitRIOL   1.25 mcg Oral Q T,Th,Sat-1800   carvedilol   12.5 mg Oral BID WC   Chlorhexidine  Gluconate Cloth  6 each Topical Q0600   DULoxetine   20 mg Oral BID   famotidine   20 mg Oral QAC breakfast   [START ON 11/06/2023] heparin   2,500 Units Dialysis Once in dialysis   insulin  aspart  0-6 Units Subcutaneous TID WC   insulin  aspart  5 Units Subcutaneous TID WC   insulin   glargine-yfgn  10 Units Subcutaneous Daily   lamoTRIgine   200 mg Oral Daily   losartan   150 mg Oral Daily   OLANZapine  zydis  5 mg Oral QHS   Pancrelipase  (Lip-Prot-Amyl)  3,000 Units Oral TID AC   sevelamer  carbonate  2,400 mg Oral TID WC   sodium bicarbonate   1,300 mg Oral BID   sodium chloride  flush  3 mL Intravenous Q12H    insulin  Stopped (11/03/23 1154)   acetaminophen , albuterol , dextrose , dicyclomine , diphenhydrAMINE , [START ON 11/06/2023] heparin , heparin , hydrALAZINE , HYDROmorphone  (DILAUDID ) injection, melatonin, metoCLOPramide  (REGLAN ) injection, naLOXone  (NARCAN )  injection, ondansetron  (ZOFRAN ) IV, oxyCODONE  **OR** [DISCONTINUED] oxyCODONE , polyethylene glycol

## 2023-11-05 NOTE — Progress Notes (Signed)
 PROGRESS NOTE    Ashley Freeman  FMW:[id number] DOB: 07-23-1990 DOA: 11/02/2023 PCP: Keven Crumbly Pap, MD    Brief Narrative:  33 year old with one of the multiple admissions with missed hemodialysis, she has type 1 diabetes on insulin  and recurrent hospitalization. Recently admitted last week with DKA and volume overload, missed 2 dialysis sessions that patient attributed to the transportation not picking her up comes to the emergency room with abdominal distention and leg edema, labial edema In the emergency room was also complaining of vulvar pain that was noted to be due to edema by ER physician, admitted with fluid overload.   Subjective: Patient seen and examined.  Continues to have significant pain on her labia swelling and now it is bigger and edematous.  Asking for Dilaudid  for pain relief.  Blood sugars are adequate today.  Going for dialysis.  Assessment & Plan:   Volume overload secondary to missed hemodialysis, tense ascites: Missed dialysis due to transportation issues per patient. Received dialysis 7/6.  Next dialysis today.  Patient has significant soft tissue edema, may benefit with additional dialysis to relieve the edema.  Nephrology following.  Anasarca/edema: Paracentesis with 1 L removed.  Fluid management with dialysis.SABRA  YYD:Emzdzwuzi with blood glucose of 827.  Initially on insulin  drip.  Improved now.  Currently on home dose of long-acting insulin  10 units and prandial insulin  5 units 3 times daily with meals. Known A1c of 10.3.  Compliance is more pressing issue.   DVT prophylaxis: Place and maintain sequential compression device Start: 11/03/23 0046   Code Status: Full code Family Communication: None at bedside Disposition Plan: Status is: Inpatient Remains inpatient appropriate because: Significantly fluid overloaded     Consultants:  Nephrology  Procedures:  Routine hemodialysis  Antimicrobials:  None     Objective: Vitals:    11/05/23 1130 11/05/23 1200 11/05/23 1230 11/05/23 1233  BP: (!) 141/76 (!) 141/76 (!) 145/77 (!) 145/77  Pulse: 85 85 92 86  Resp: 16 15 16 19   Temp:      TempSrc:      SpO2: 96% 99% 97% 96%  Weight:      Height:        Intake/Output Summary (Last 24 hours) at 11/05/2023 1312 Last data filed at 11/04/2023 2000 Gross per 24 hour  Intake 240 ml  Output --  Net 240 ml   Filed Weights   11/04/23 0614 11/05/23 0600 11/05/23 0945  Weight: 77.6 kg 75.9 kg 75.8 kg    Examination:  General exam: Appears calm and comfortable.  On room air.  Slightly anxious. Respiratory system: Clear to auscultation. Respiratory effort normal.  No added sounds. Cardiovascular system: S1 & S2 heard, RRR.  Gastrointestinal system: Soft.  Nontender.  Tense and distended.  No rigidity or guarding. Central nervous system: Alert and oriented. No focal neurological deficits. Extremities: Symmetric 5 x 5 power. Tense edema of both legs. Patient examined with patient's RN as chaperone. Edematous and enlarged right vulva, asymmetrical edema right more than left.    Data Reviewed: I have personally reviewed following labs and imaging studies  CBC: Recent Labs  Lab 10/31/23 2149 11/02/23 2140 11/02/23 2205 11/03/23 0539 11/05/23 0806  WBC 8.2 10.2  --  12.1* 13.0*  NEUTROABS  --  6.4  --   --   --   HGB 10.5* 10.9* 12.2 11.1* 10.6*  HCT 34.7* 36.3 36.0 35.0* 33.4*  MCV 91.6 89.4  --  87.5 86.8  PLT 351 309  --  367 263   Basic Metabolic Panel: Recent Labs  Lab 10/31/23 2148 10/31/23 2149 11/02/23 2140 11/02/23 2205 11/03/23 0539 11/05/23 0806  NA 126*  127* 129* 127* 124* 130* 131*  K 5.4*  5.4* 5.5* 5.5* 5.4* 4.4 5.3*  CL 94* 90* 90*  --  94* 95*  CO2  --  19* 19*  --  15* 22  GLUCOSE >700* 864* 827*  --  230* 242*  BUN 65* 74* 77*  --  76* 58*  CREATININE 8.40* 8.81* 9.28*  --  9.48* 7.60*  CALCIUM   --  8.4* 8.5*  --  8.9 8.5*  MG  --   --   --   --  2.5*  --   PHOS  --   --   --    --  8.8* 7.0*   GFR: Estimated Creatinine Clearance: 10.5 mL/min (A) (by C-G formula based on SCr of 7.6 mg/dL (H)). Liver Function Tests: Recent Labs  Lab 10/31/23 2149 11/03/23 0539 11/05/23 0806  AST 24 49*  --   ALT 21 28  --   ALKPHOS 440* 472*  --   BILITOT 0.9 0.8  --   PROT 5.5* 5.3*  --   ALBUMIN  2.6* 2.6* 2.3*   No results for input(s): LIPASE, AMYLASE in the last 168 hours. No results for input(s): AMMONIA in the last 168 hours. Coagulation Profile: No results for input(s): INR, PROTIME in the last 168 hours. Cardiac Enzymes: No results for input(s): CKTOTAL, CKMB, CKMBINDEX, TROPONINI in the last 168 hours. BNP (last 3 results) No results for input(s): PROBNP in the last 8760 hours. HbA1C: No results for input(s): HGBA1C in the last 72 hours. CBG: Recent Labs  Lab 11/04/23 1646 11/04/23 2056 11/04/23 2215 11/05/23 0559 11/05/23 0813  GLUCAP 149* 51* 224* 162* 245*   Lipid Profile: No results for input(s): CHOL, HDL, LDLCALC, TRIG, CHOLHDL, LDLDIRECT in the last 72 hours. Thyroid  Function Tests: No results for input(s): TSH, T4TOTAL, FREET4, T3FREE, THYROIDAB in the last 72 hours. Anemia Panel: No results for input(s): VITAMINB12, FOLATE, FERRITIN, TIBC, IRON , RETICCTPCT in the last 72 hours. Sepsis Labs: No results for input(s): PROCALCITON, LATICACIDVEN in the last 168 hours.  Recent Results (from the past 240 hours)  MRSA Next Gen by PCR, Nasal     Status: None   Collection Time: 11/03/23  5:51 AM   Specimen: Nasal Mucosa; Nasal Swab  Result Value Ref Range Status   MRSA by PCR Next Gen NOT DETECTED NOT DETECTED Final    Comment: (NOTE) The GeneXpert MRSA Assay (FDA approved for NASAL specimens only), is one component of a comprehensive MRSA colonization surveillance program. It is not intended to diagnose MRSA infection nor to guide or monitor treatment for MRSA infections. Test  performance is not FDA approved in patients less than 31 years old. Performed at [facility], [address], [place], [place] [address]   Gram stain     Status: None   Collection Time: 11/04/23 11:14 AM   Specimen: Abdomen; Peritoneal Fluid  Result Value Ref Range Status   Specimen Description ABDOMEN  Final   Special Requests NONE  Final   Gram Stain   Final    NO WBC SEEN NO ORGANISMS SEEN Performed at [facility], [address], [place], [place] [address]    Report Status 11/04/2023 FINAL  Final  Culture, body fluid w Gram Stain-bottle     Status: None (Preliminary result)   Collection  Time: 11/04/23 11:15 AM   Specimen: Peritoneal Washings  Result Value Ref Range Status   Specimen Description PERITONEAL  Final   Special Requests NONE  Final   Culture   Final    NO GROWTH < 24 HOURS Performed at [facility], [address], [place], [place] [address]    Report Status PENDING  Incomplete         Radiology Studies: IR Paracentesis Result Date: 11/04/2023 INDICATION: End-stage renal disease with recurrent ascites. Request for diagnostic and therapeutic paracentesis. EXAM: ULTRASOUND GUIDED PARACENTESIS MEDICATIONS: 1% lidocaine  10 mL COMPLICATIONS: None immediate. PROCEDURE: Informed written consent was obtained from the patient after a discussion of the risks, benefits and alternatives to treatment. A timeout was performed prior to the initiation of the procedure. Initial ultrasound scanning demonstrates a moderate amount of ascites within the left lateral abdomen. The left lateral abdomen was prepped and draped in the usual sterile fashion. 1% lidocaine  was used for local anesthesia. Following this, a 19 gauge, 7-cm, Yueh catheter was introduced. An ultrasound image was saved for documentation purposes. The paracentesis was performed. The catheter was removed and a dressing was applied. The patient tolerated the procedure well without immediate post  procedural complication. FINDINGS: A total of approximately 1.5 L of clear yellow fluid was removed. Samples were sent to the laboratory as requested by the clinical team. IMPRESSION: Successful ultrasound-guided paracentesis yielding 1.5 liters of peritoneal fluid. Procedure performed by: Sari Lamp, PA-C Electronically Signed   By: Juliene Balder M.D.   On: 11/04/2023 15:01        Scheduled Meds:  atorvastatin   80 mg Oral Daily   bumetanide   2 mg Oral Daily   calcitRIOL   1.25 mcg Oral Q T,Th,Sat-1800   carvedilol   12.5 mg Oral BID WC   Chlorhexidine  Gluconate Cloth  6 each Topical Q0600   DULoxetine   20 mg Oral BID   famotidine   20 mg Oral QAC breakfast   [START ON 11/06/2023] heparin   2,500 Units Dialysis Once in dialysis   insulin  aspart  0-6 Units Subcutaneous TID WC   insulin  aspart  5 Units Subcutaneous TID WC   insulin  glargine-yfgn  10 Units Subcutaneous Daily   lamoTRIgine   200 mg Oral Daily   losartan   150 mg Oral Daily   OLANZapine  zydis  5 mg Oral QHS   Pancrelipase  (Lip-Prot-Amyl)  3,000 Units Oral TID AC   sevelamer  carbonate  2,400 mg Oral TID WC   sodium bicarbonate   1,300 mg Oral BID   sodium chloride  flush  3 mL Intravenous Q12H   Continuous Infusions:  insulin  Stopped (11/03/23 1154)     LOS: 2 days    Time spent: 40 minutes    Renato Applebaum, MD [facility]

## 2023-11-05 NOTE — Plan of Care (Signed)
  Problem: Education: Goal: Knowledge of General Education information will improve Description: Including pain rating scale, medication(s)/side effects and non-pharmacologic comfort measures Outcome: Progressing   Problem: Health Behavior/Discharge Planning: Goal: Ability to manage health-related needs will improve Outcome: Progressing   Problem: Clinical Measurements: Goal: Ability to maintain clinical measurements within normal limits will improve Outcome: Progressing Goal: Will remain free from infection Outcome: Progressing Goal: Diagnostic test results will improve Outcome: Progressing Goal: Respiratory complications will improve Outcome: Progressing Goal: Cardiovascular complication will be avoided Outcome: Progressing   Problem: Activity: Goal: Risk for activity intolerance will decrease Outcome: Progressing   Problem: Nutrition: Goal: Adequate nutrition will be maintained Outcome: Progressing   Problem: Coping: Goal: Level of anxiety will decrease Outcome: Progressing   Problem: Elimination: Goal: Will not experience complications related to bowel motility Outcome: Progressing Goal: Will not experience complications related to urinary retention Outcome: Progressing   Problem: Pain Managment: Goal: General experience of comfort will improve and/or be controlled Outcome: Progressing   Problem: Safety: Goal: Ability to remain free from injury will improve Outcome: Progressing   Problem: Skin Integrity: Goal: Risk for impaired skin integrity will decrease Outcome: Progressing   Problem: Education: Goal: Ability to describe self-care measures that may prevent or decrease complications (Diabetes Survival Skills Education) will improve Outcome: Progressing Goal: Individualized Educational Video(s) Outcome: Progressing   Problem: Coping: Goal: Ability to adjust to condition or change in health will improve Outcome: Progressing   Problem: Fluid  Volume: Goal: Ability to maintain a balanced intake and output will improve Outcome: Progressing   Problem: Health Behavior/Discharge Planning: Goal: Ability to identify and utilize available resources and services will improve Outcome: Progressing Goal: Ability to manage health-related needs will improve Outcome: Progressing   Problem: Metabolic: Goal: Ability to maintain appropriate glucose levels will improve Outcome: Progressing   Problem: Nutritional: Goal: Maintenance of adequate nutrition will improve Outcome: Progressing Goal: Progress toward achieving an optimal weight will improve Outcome: Progressing   Problem: Skin Integrity: Goal: Risk for impaired skin integrity will decrease Outcome: Progressing   Problem: Tissue Perfusion: Goal: Adequacy of tissue perfusion will improve Outcome: Progressing   Problem: Education: Goal: Ability to describe self-care measures that may prevent or decrease complications (Diabetes Survival Skills Education) will improve Outcome: Progressing Goal: Individualized Educational Video(s) Outcome: Progressing   Problem: Cardiac: Goal: Ability to maintain an adequate cardiac output will improve Outcome: Progressing   Problem: Health Behavior/Discharge Planning: Goal: Ability to identify and utilize available resources and services will improve Outcome: Progressing Goal: Ability to manage health-related needs will improve Outcome: Progressing   Problem: Fluid Volume: Goal: Ability to achieve a balanced intake and output will improve Outcome: Progressing   Problem: Metabolic: Goal: Ability to maintain appropriate glucose levels will improve Outcome: Progressing   Problem: Nutritional: Goal: Maintenance of adequate nutrition will improve Outcome: Progressing Goal: Maintenance of adequate weight for body size and type will improve Outcome: Progressing   Problem: Respiratory: Goal: Will regain and/or maintain adequate  ventilation Outcome: Progressing

## 2023-11-06 ENCOUNTER — Inpatient Hospital Stay (HOSPITAL_COMMUNITY)

## 2023-11-06 DIAGNOSIS — E1100 Type 2 diabetes mellitus with hyperosmolarity without nonketotic hyperglycemic-hyperosmolar coma (NKHHC): Secondary | ICD-10-CM | POA: Diagnosis not present

## 2023-11-06 DIAGNOSIS — N9489 Other specified conditions associated with female genital organs and menstrual cycle: Secondary | ICD-10-CM

## 2023-11-06 LAB — GLUCOSE, CAPILLARY
Glucose-Capillary: 97 mg/dL (ref 70–99)
Glucose-Capillary: 44 mg/dL — CL (ref 70–99)
Glucose-Capillary: 144 mg/dL — ABNORMAL HIGH (ref 70–99)
Glucose-Capillary: 125 mg/dL — ABNORMAL HIGH (ref 70–99)
Glucose-Capillary: 160 mg/dL — ABNORMAL HIGH (ref 70–99)
Glucose-Capillary: 222 mg/dL — ABNORMAL HIGH (ref 70–99)
Glucose-Capillary: 211 mg/dL — ABNORMAL HIGH (ref 70–99)

## 2023-11-06 LAB — HEPATITIS B SURFACE ANTIBODY, QUANTITATIVE: Hep B S AB Quant (Post): <3.5 m[IU]/mL — ABNORMAL LOW

## 2023-11-06 MED ORDER — SODIUM CHLORIDE 0.9 % IV SOLN
2.0000 g | INTRAVENOUS | Status: DC
  Administered 2023-11-06: 2 g via INTRAVENOUS
  Filled 2023-11-06: qty 20

## 2023-11-06 MED ORDER — VANCOMYCIN HCL 750 MG/150ML IV SOLN
750.0000 mg | INTRAVENOUS | Status: DC
  Filled 2023-11-06: qty 150

## 2023-11-06 MED ORDER — INSULIN GLARGINE-YFGN 100 UNIT/ML ~~LOC~~ SOLN
8.0000 [IU] | Freq: Every day | SUBCUTANEOUS | Status: DC
  Administered 2023-11-07: 8 [IU] via SUBCUTANEOUS
  Filled 2023-11-06 (×2): qty 0.08

## 2023-11-06 MED ORDER — CHLORHEXIDINE GLUCONATE CLOTH 2 % EX PADS
6.0000 | MEDICATED_PAD | Freq: Every day | CUTANEOUS | Status: DC
Start: 2023-11-07 — End: 2023-11-06

## 2023-11-06 MED ORDER — INSULIN ASPART 100 UNIT/ML IJ SOLN
4.0000 [IU] | Freq: Three times a day (TID) | INTRAMUSCULAR | Status: DC
  Administered 2023-11-06 – 2023-11-08 (×2): 4 [IU] via SUBCUTANEOUS

## 2023-11-06 MED ORDER — VANCOMYCIN HCL 1500 MG/300ML IV SOLN
1500.0000 mg | Freq: Once | INTRAVENOUS | Status: AC
  Administered 2023-11-06: 1500 mg via INTRAVENOUS
  Filled 2023-11-06: qty 300

## 2023-11-06 MED ORDER — HYDROMORPHONE HCL 1 MG/ML IJ SOLN
0.5000 mg | INTRAMUSCULAR | Status: AC | PRN
  Administered 2023-11-06 – 2023-11-07 (×3): 0.5 mg via INTRAVENOUS
  Filled 2023-11-06 (×3): qty 0.5

## 2023-11-06 MED ORDER — IOHEXOL 350 MG/ML SOLN
60.0000 mL | Freq: Once | INTRAVENOUS | Status: AC | PRN
  Administered 2023-11-06: 60 mL via INTRAVENOUS

## 2023-11-06 NOTE — Progress Notes (Signed)
 [facility] Progress Note  Subjective:  Seen in room No c/o's  Vitals:   11/05/23 2322 11/06/23 0615 11/06/23 0740 11/06/23 1200  BP:  135/79 136/79 128/78  Pulse: 80     Resp:  15 15 12   Temp:  98.1 F (36.7 C) 97.7 F (36.5 C) 98.1 F (36.7 C)  TempSrc:  Axillary Axillary Axillary  SpO2:      Weight:  76.3 kg    Height:        Exam: Gen: NAD, comfortable, on RA Respiratory: Clear bilateral, no rales Cardiovascular: Regular rate rhythm S1-S2 normal, no rubs GI: Abdomen soft, nontender,+distended Extremities: diffuse bilat LE/ hip edema Neurology: Alert, awake, following commands   AV fistula+t/b      OP HD: TTS [facility] 3h  60kg   B350  AVG   Heparin  1600 + 600u/hr  Assessment/Plan: Volume overload: due to missed dialysis treatment associated with ascites. Might be a chronic problem. Had HD 7/06 and 7/08 w/ 4 L off x 2. Still diffuse hip/ LE edema. Paracentesis w/ only 1.5 L removed. Standing wt yesterday was 72.4kg which is 12 kg over. Have reinforced fluid restriction w/ the patient and changed the diet from carb mod ->  renal / carb mod w/ 1.2 L fluid restriction.  ESRD: TTS at [facility] in [place].  Misses dialysis frequently. Patient reported that she misses HD due to a transportation problem. HD tomorrow.  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   Myer Fret MD  [facility] 11/06/2023, 2:10 PM  Recent Labs  Lab 11/03/23 0539 11/05/23 0806  HGB 11.1* 10.6*  ALBUMIN  2.6* 2.3*  CALCIUM  8.9 8.5*  PHOS 8.8* 7.0*  CREATININE 9.48* 7.60*  K 4.4 5.3*   No results for input(s): IRON , TIBC, FERRITIN in the last 168 hours. Inpatient medications:  atorvastatin   80 mg Oral Daily   bumetanide   2 mg Oral Daily   calcitRIOL   1.25 mcg Oral Q T,Th,Sat-1800   carvedilol   12.5 mg Oral BID WC   Chlorhexidine  Gluconate Cloth  6 each Topical Q0600   DULoxetine   20 mg Oral BID    famotidine   20 mg Oral QAC breakfast   insulin  aspart  0-6 Units Subcutaneous TID WC   insulin  aspart  4 Units Subcutaneous TID WC   [START ON 11/07/2023] insulin  glargine-yfgn  8 Units Subcutaneous Daily   lamoTRIgine   200 mg Oral Daily   lipase/protease/amylase  12,000 Units Oral Daily   losartan   150 mg Oral Daily   OLANZapine  zydis  5 mg Oral QHS   sevelamer  carbonate  2,400 mg Oral TID WC   sodium bicarbonate   1,300 mg Oral BID   sodium chloride  flush  3 mL Intravenous Q12H    cefTRIAXone  (ROCEPHIN )  IV     vancomycin      [START ON 11/07/2023] vancomycin      acetaminophen , albuterol , dextrose , dicyclomine , diphenhydrAMINE , hydrALAZINE , HYDROmorphone  (DILAUDID ) injection, melatonin, metoCLOPramide  (REGLAN ) injection, naLOXone  (NARCAN )  injection, ondansetron  (ZOFRAN ) IV, oxyCODONE  **OR** [DISCONTINUED] oxyCODONE , polyethylene glycol

## 2023-11-06 NOTE — Progress Notes (Signed)
 Pharmacy Antibiotic Note  Ashley Freeman is a 33 y.o. female admitted on 11/02/2023 with cellulitis.  Pharmacy has been consulted for vancomycin  dosing.  Pt is ESRD with last HD 7/8.  Usual schedule is TTS.  Plan: Vancomycin  1500 mg x 1 now, then 750 mg IV QHD. F/u cultures, HD schedule and clinical course.   Height: 5' 3 (160 cm) Weight: 76.3 kg (168 lb 3.4 oz) IBW/kg (Calculated) : 52.4  Temp (24hrs), Avg:98.1 F (36.7 C), Min:97.7 F (36.5 C), Max:98.2 F (36.8 C)  Recent Labs  Lab 10/31/23 2148 10/31/23 2149 11/02/23 2140 11/03/23 0539 11/05/23 0806  WBC  --  8.2 10.2 12.1* 13.0*  CREATININE 8.40* 8.81* 9.28* 9.48* 7.60*    Estimated Creatinine Clearance: 10.5 mL/min (A) (by C-G formula based on SCr of 7.6 mg/dL (H)).    Allergies  Allergen Reactions   Cephalexin  Anaphylaxis and Other (See Comments)    Has gotten ceftriaxone  in the past    Morphine  Itching   Penicillins Hives and Rash   Fish Allergy Other (See Comments)    Allergic   Benadryl  [Diphenhydramine ] Itching   Dilaudid  [Hydromorphone ] Itching   Doxycycline  Itching   Methotrexate Derivatives Rash   Oxycodone  Itching    Antimicrobials this admission:  Vancomycin  7/9 >   Dose adjustments this admission:   Microbiology results:  7/7 Peritoneal fluid - ngtd 7/6 MRSA PCR neg  Thank you for allowing pharmacy to be a part of this patient's care.  Harlene Barlow, [employer], [facility] Clinical Pharmacist  11/06/2023 2:00 PM   [facility] pharmacy phone numbers are listed on [email or web address]

## 2023-11-06 NOTE — Consult Note (Signed)
 OB/GYN Consult Note  Referring Provider: Raenelle Renato Aldean Freeman Ashley Freeman is a 33 y.o. [phone] admitted for complications related to uncontrolled type 1 DM.  On admission patient also had labial pain and swelling. Gyn consulted for evaluation and treatment of the labial issue.  Pt notes she has had the lesion for about four days.  The area has been increasing in size and is becoming more uncomfortable.  The pain is becoming more constant.  Pt had a similar lesion about 4 years ago and had I and D while pregnant.  No abscess pocket was identified at that time.  Pt denies any systemic  symptoms such as fever/chills at this time.      Past Medical History:  Diagnosis Date   Abscess, gluteal, right 08/24/2013   Anemia 02/19/2012   Bartholin's gland abscess 09/19/2013   Bipolar disorder (HCC)    BV (bacterial vaginosis) 11/24/2015   Depression    Diabetes mellitus type I (HCC) 2001   Diagnosed at age 60 ; Type I   Diarrhea 05/30/2016   DKA (diabetic ketoacidoses) 08/19/2013   Also in 2018   ESRD (end stage renal disease) (HCC)    Gonorrhea 08/2011   Treated in 09/2011   HFrEF (heart failure with reduced ejection fraction) (HCC)    a. 2022 Echo: EF 40%; b. 10/2021 Echo: EF 55%; b. 07/2022 MV: No ischemia. EF 31%; c. 08/2022 Echo: EF 35%, mildly dil RV, sev TR.   History of trichomoniasis 05/31/2016   Hyperlipidemia 03/28/2016   Hypertension    NICM (nonischemic cardiomyopathy) (HCC)    Sepsis (HCC) 09/19/2013    Past Surgical History:  Procedure Laterality Date   A/V FISTULAGRAM Right 06/17/2023   Procedure: A/V Fistulagram;  Surgeon: Marea Selinda GORMAN, MD;  Location: [facility] INVASIVE CV LAB;  Service: Cardiovascular;  Laterality: Right;   A/V SHUNT INTERVENTION N/A 09/25/2023   Procedure: A/V SHUNT INTERVENTION;  Surgeon: Pearline Norman GORMAN, MD;  Location: [facility] LAB;  Service: Cardiovascular;  Laterality: N/A;   AV FISTULA PLACEMENT Right 07/06/2022   Procedure: ARTERIOVENOUS  GRAFT CREATION;  Surgeon: Gretta Lonni PARAS, MD;  Location: [facility] OR;  Service: Vascular;  Laterality: Right;   CESAREAN SECTION N/A 10/05/2019   Procedure: CESAREAN SECTION;  Surgeon: Izell Harari, MD;  Location: [facility] LD ORS;  Service: Obstetrics;  Laterality: N/A;   CHOLECYSTECTOMY N/A 07/02/2023   Procedure: LAPAROSCOPIC CHOLECYSTECTOMY;  Surgeon: Ebbie Cough, MD;  Location: [facility] OR;  Service: General;  Laterality: N/A;   INCISION AND DRAINAGE ABSCESS Left 09/28/2019   Procedure: INCISION AND DRAINAGE VULVAR ABCESS;  Surgeon: Edsel Norleen GAILS, MD;  Location: [facility] OR;  Service: Gynecology;  Laterality: Left;   INCISION AND DRAINAGE PERIRECTAL ABSCESS Right 08/18/2013   Procedure: IRRIGATION AND DEBRIDEMENT GLUTEAL ABSCESS;  Surgeon: Lynda Leos, MD;  Location: [facility] OR;  Service: General;  Laterality: Right;   INCISION AND DRAINAGE PERIRECTAL ABSCESS Right 09/19/2013   Procedure: IRRIGATION AND DEBRIDEMENT RIGHT GLUTEAL AND LABIAL ABSCESSES;  Surgeon: Lynda Leos, MD;  Location: [facility] OR;  Service: General;  Laterality: Right;   INCISION AND DRAINAGE PERIRECTAL ABSCESS Right 09/24/2013   Procedure: IRRIGATION AND DEBRIDEMENT PERIRECTAL ABSCESS;  Surgeon: Lynwood MALVA Pina, MD;  Location: [facility] OR;  Service: General;  Laterality: Right;   IR PARACENTESIS  08/28/2023   IR PARACENTESIS  11/04/2023    OB History  Gravida Para Term Preterm AB Living  2 1  1 1 1   SAB IAB Ectopic Multiple Live Births  1  0 1    # Outcome Date GA Lbr Len/2nd Weight Sex Type Anes PTL Lv  2 Preterm 10/05/19 [redacted]w[redacted]d  1500 g F CS-LTranv Spinal  LIV  1 SAB 08/23/16 [redacted]w[redacted]d            Birth Comments: 1st trimester    Social History   Socioeconomic History   Marital status: Single    Spouse name: Not on file   Number of children: 0   Years of education: 11th grade   Highest education level: Not on file  Occupational History   Occupation: unemployed    Comment: has never worked  Tobacco Use   Smoking status:  Never    Passive exposure: Never   Smokeless tobacco: Never  Vaping Use   Vaping status: Never Used  Substance and Sexual Activity   Alcohol use: Not Currently   Drug use: No   Sexual activity: Yes    Birth control/protection: None  Other Topics Concern   Not on file  Social History Narrative   Patient lives in [place] mother lives in [place].  Unemployed.  Previously worked for a Customer service manager.  Completed 11 grade working on [employer]. Patient 3 brothers    Social Drivers of Corporate investment banker Strain: Low Risk  (09/18/2023)   [facility]   Overall Financial Resource Strain (CARDIA)    Difficulty of Paying Living Expenses: Not very hard  Food Insecurity: No Food Insecurity (11/03/2023)   Hunger Vital Sign    Worried About Running Out of Food in the Last Year: Never true    Ran Out of Food in the Last Year: Never true  Transportation Needs: Unmet Transportation Needs (11/03/2023)   PRAPARE - Administrator, Civil Service (Medical): Yes    Lack of Transportation (Non-Medical): Yes  Physical Activity: Insufficiently Active (03/02/2022)   Received from [facility]   Exercise Vital Sign    On average, how many days per week do you engage in moderate to strenuous exercise (like a brisk walk)?: 2 days    On average, how many minutes do you engage in exercise at this level?: 10 min  Stress: No Stress Concern Present (03/08/2023)   Received from [facility] of Occupational Health - Occupational Stress Questionnaire    Feeling of Stress : Not at all  Social Connections: Moderately Integrated (08/28/2023)   Social Connection and Isolation Panel    Frequency of Communication with Friends and Family: More than three times a week    Frequency of Social Gatherings with Friends and Family: Once a week    Attends Religious Services: More than 4 times per year    Active Member of [employer] or Organizations: Yes    Attends Tax inspector Meetings: Never    Marital Status: Never married    Family History  Problem Relation Age of Onset   Asthma Mother    Carpal tunnel syndrome Mother    Gout Father    Diabetes Paternal Grandmother    Anesthesia problems Neg Hx     Medications Prior to Admission  Medication Sig Dispense Refill Last Dose/Taking   atorvastatin  (LIPITOR ) 80 MG tablet Take 1 tablet (80 mg total) by mouth daily. 90 tablet 3 11/02/2023 Morning   bumetanide  (BUMEX ) 2 MG tablet Take 2 mg by mouth daily.   11/02/2023 Morning   calcitRIOL  (ROCALTROL ) 0.25 MCG capsule Take 5 capsules (1.25 mcg total) by mouth every Tuesday, Thursday,  and Saturday at 6 PM. 30 capsule 0 11/02/2023 Morning   carvedilol  (COREG ) 12.5 MG tablet Take 1 tablet (12.5 mg total) by mouth 2 (two) times daily with a meal. 30 tablet 0 11/02/2023 Morning   Continuous Glucose Sensor (DEXCOM G7 SENSOR) MISC Change sensors every 10 days (Patient taking differently: Inject 1 Device into the skin See admin instructions. Place 1 new sensor into the skin every 10 days) 3 each 3 10/31/2023   cyclobenzaprine  (FLEXERIL ) 5 MG tablet Take 1 tablet (5 mg total) by mouth 3 (three) times daily as needed for muscle spasms. 30 tablet 0 11/02/2023 Morning   dicyclomine  (BENTYL ) 20 MG tablet Take 20 mg by mouth 2 (two) times daily as needed for spasms.   11/02/2023 Morning   DULoxetine  (CYMBALTA ) 20 MG capsule Take 20 mg by mouth 2 (two) times daily.   11/02/2023 Morning   famotidine  (PEPCID ) 20 MG tablet Take 20 mg by mouth daily before breakfast.   11/02/2023 Morning   insulin  aspart (NOVOLOG ) 100 UNIT/ML injection Inject 5 Units into the skin 3 (three) times daily with meals.   11/02/2023 Evening   lamoTRIgine  (LAMICTAL ) 200 MG tablet Take 1 tablet (200 mg total) by mouth daily. 30 tablet 0 11/02/2023 Morning   LANTUS  SOLOSTAR 100 UNIT/ML Solostar Pen Inject 10 Units into the skin in the morning.   11/02/2023 Morning   losartan  (COZAAR ) 100 MG tablet Take 150 mg by mouth daily.    11/02/2023 Morning   metoCLOPramide  (REGLAN ) 10 MG tablet Take 1 tablet (10 mg total) by mouth every 8 (eight) hours as needed for nausea. 12 tablet 0 Past Week   OLANZapine  zydis (ZYPREXA ) 5 MG disintegrating tablet Take 5 mg by mouth at bedtime.   11/01/2023 Bedtime   ondansetron  (ZOFRAN -ODT) 4 MG disintegrating tablet Take 1 tablet (4 mg total) by mouth every 8 (eight) hours as needed for nausea or vomiting. 10 tablet 0 Unknown   Pancrelipase , Lip-Prot-Amyl, 3000-9500 units CPEP Take 3,000 units of lipase by mouth 3 (three) times daily.   11/02/2023 Evening   sevelamer  carbonate (RENVELA ) 800 MG tablet Take 2,400 mg by mouth 3 (three) times daily with meals.   11/02/2023 Evening   sodium bicarbonate  650 MG tablet Take 1,300 mg by mouth 2 (two) times daily.   11/02/2023 Evening   SUMAtriptan  (IMITREX ) 50 MG tablet Take 50 mg by mouth every 2 (two) hours as needed for migraine or headache.   11/02/2023 Morning   albuterol  (VENTOLIN  HFA) 108 (90 Base) MCG/ACT inhaler Inhale 2 puffs into the lungs every 4 (four) hours as needed for wheezing or shortness of breath. (Patient not taking: Reported on 11/02/2023)   Not Taking   prochlorperazine  (COMPAZINE ) 5 MG tablet Take 5 mg by mouth every 6 (six) hours as needed for nausea.       Allergies  Allergen Reactions   Cephalexin  Anaphylaxis and Other (See Comments)    Has gotten ceftriaxone  in the past    Morphine  Itching   Penicillins Hives and Rash   Fish Allergy Other (See Comments)    Allergic   Benadryl  [Diphenhydramine ] Itching   Dilaudid  [Hydromorphone ] Itching   Doxycycline  Itching   Methotrexate Derivatives Rash   Oxycodone  Itching    Review of Systems: Negative except for what is mentioned in HPI.     Physical Exam: BP 128/78 (BP Location: Left Arm)   Pulse 80   Temp 98.1 F (36.7 C) (Axillary)   Resp 12   Ht 5' 3 (1.6  m)   Wt 76.3 kg   LMP 01/29/2020 (Approximate)   SpO2 95%   BMI 29.80 kg/m  CONSTITUTIONAL: Well-developed,  well-nourished female in no acute distress. Somewhat somnolent. HENT:  Normocephalic, atraumatic, External right and left ear normal. Oropharynx is clear and moist EYES: Conjunctivae and EOM are normal.  NECK: Normal range of motion, supple, no masses SKIN: Skin is warm and dry. No rash noted. Not diaphoretic. No erythema. No pallor. NEUROLGIC: Alert and oriented to person, place, and time. Normal reflexes, muscle tone coordination. No cranial nerve deficit noted. PSYCHIATRIC: Normal mood and affect. Normal behavior. Normal judgment and thought content. ABDOMEN: Soft, nontender, nondistended, Well-healed Pfannenstiel incision. PELVIC: obvious right labial abscess/swelling, glassy appearance, no obvious ulceration or drainage,  MUSCULOSKELETAL: Normal range of motion. No edema and no tenderness. 2+ distal pulses.  Pelvic exam done with nurse/ chaperone present.  Pertinent Labs/Studies:   Results for orders placed or performed during the hospital encounter of 11/02/23 (from the past 72 hours)  Glucose, capillary     Status: Abnormal   Collection Time: 11/03/23 12:58 PM  Result Value Ref Range   Glucose-Capillary 131 (H) 70 - 99 mg/dL    Comment: Glucose reference range applies only to samples taken after fasting for at least 8 hours.  Glucose, capillary     Status: None   Collection Time: 11/03/23  6:30 PM  Result Value Ref Range   Glucose-Capillary 80 70 - 99 mg/dL    Comment: Glucose reference range applies only to samples taken after fasting for at least 8 hours.  Glucose, capillary     Status: Abnormal   Collection Time: 11/03/23  9:20 PM  Result Value Ref Range   Glucose-Capillary 113 (H) 70 - 99 mg/dL    Comment: Glucose reference range applies only to samples taken after fasting for at least 8 hours.   Comment 1 Notify RN   Glucose, capillary     Status: Abnormal   Collection Time: 11/04/23  6:41 AM  Result Value Ref Range   Glucose-Capillary 203 (H) 70 - 99 mg/dL     Comment: Glucose reference range applies only to samples taken after fasting for at least 8 hours.   Comment 1 Notify RN   Gram stain     Status: None   Collection Time: 11/04/23 11:14 AM   Specimen: Abdomen; Peritoneal Fluid  Result Value Ref Range   Specimen Description ABDOMEN    Special Requests NONE    Gram Stain      NO WBC SEEN NO ORGANISMS SEEN Performed at [facility] Lab, [address], [place], [place] [address]    Report Status 11/04/2023 FINAL   Culture, body fluid w Gram Stain-bottle     Status: None (Preliminary result)   Collection Time: 11/04/23 11:15 AM   Specimen: Peritoneal Washings  Result Value Ref Range   Specimen Description PERITONEAL    Special Requests NONE    Culture      NO GROWTH 2 DAYS Performed at [facility] Lab, [address], [place], [place] [address]    Report Status PENDING   Glucose, capillary     Status: None   Collection Time: 11/04/23 11:25 AM  Result Value Ref Range   Glucose-Capillary 86 70 - 99 mg/dL    Comment: Glucose reference range applies only to samples taken after fasting for at least 8 hours.  Body fluid cell count with differential     Status: Abnormal   Collection Time:  11/04/23 11:39 AM  Result Value Ref Range   Fluid Type-FCT PERITONEAL     Comment: ABDOMEN NO CYTO CORRECTED ON 07/07 AT 1142: PREVIOUSLY REPORTED AS ABDOMEN    Color, Fluid YELLOW (A) YELLOW   Appearance, Fluid TURBID (A) CLEAR   Total Nucleated Cell Count, Fluid 574 0 - 1,000 cu mm   Neutrophil Count, Fluid 31 (H) 0 - 25 %   Lymphs, Fluid 46 %   Monocyte-Macrophage-Serous Fluid 23 (L) 50 - 90 %   Eos, Fluid 0 %   Other Cells, Fluid OTHER CELLS IDENTIFIED AS MESOTHELIAL CELLS %    Comment: Performed at [facility] Lab, [address], [place], [place] [address]  Albumin , pleural or peritoneal fluid      Status: None   Collection Time: 11/04/23 11:39 AM  Result Value Ref Range   Albumin , Fluid 1.6 g/dL    Comment: (NOTE) No normal  range established for this test Results should be evaluated in conjunction with serum values    Fluid Type-FALB PERITONEAL     Comment: ABDOMEN Performed at [facility] Lab, [address], [place], [place] [address] CORRECTED ON 07/07 AT 1142: PREVIOUSLY REPORTED AS ABDOMEN   Protein, pleural or peritoneal fluid     Status: None   Collection Time: 11/04/23 11:39 AM  Result Value Ref Range   Total protein, fluid <3.0 g/dL    Comment: (NOTE) No normal range established for this test Results should be evaluated in conjunction with serum values    Fluid Type-FTP PERITONEAL     Comment: ABDOMEN Performed at [facility], [address], [place], [place] [address] CORRECTED ON 07/07 AT 1142: PREVIOUSLY REPORTED AS ABDOMEN   Glucose, capillary     Status: Abnormal   Collection Time: 11/04/23  4:19 PM  Result Value Ref Range   Glucose-Capillary 45 (L) 70 - 99 mg/dL    Comment: Glucose reference range applies only to samples taken after fasting for at least 8 hours.  Glucose, capillary     Status: Abnormal   Collection Time: 11/04/23  4:46 PM  Result Value Ref Range   Glucose-Capillary 149 (H) 70 - 99 mg/dL    Comment: Glucose reference range applies only to samples taken after fasting for at least 8 hours.  Glucose, capillary     Status: Abnormal   Collection Time: 11/04/23  8:56 PM  Result Value Ref Range   Glucose-Capillary 51 (L) 70 - 99 mg/dL    Comment: Glucose reference range applies only to samples taken after fasting for at least 8 hours.  Glucose, capillary     Status: Abnormal   Collection Time: 11/04/23 10:15 PM  Result Value Ref Range   Glucose-Capillary 224 (H) 70 - 99 mg/dL    Comment: Glucose reference range applies only to samples taken after fasting for at least 8 hours.  Glucose, capillary     Status: Abnormal   Collection Time: 11/05/23  5:59 AM  Result Value Ref Range   Glucose-Capillary 162 (H) 70 - 99 mg/dL    Comment: Glucose reference range  applies only to samples taken after fasting for at least 8 hours.  CBC     Status: Abnormal   Collection Time: 11/05/23  8:06 AM  Result Value Ref Range   WBC 13.0 (H) 4.0 - 10.5 K/uL   RBC 3.85 (L) 3.87 - 5.11 MIL/uL   Hemoglobin 10.6 (L) 12.0 - 15.0 g/dL   HCT 66.5 (L) 63.9 -  46.0 %   MCV 86.8 80.0 - 100.0 fL   MCH 27.5 26.0 - 34.0 pg   MCHC 31.7 30.0 - 36.0 g/dL   RDW 84.7 88.4 - 84.4 %   Platelets 263 150 - 400 K/uL   nRBC 0.0 0.0 - 0.2 %    Comment: Performed at [facility], [address] [place], [place] [address]  Renal function panel     Status: Abnormal   Collection Time: 11/05/23  8:06 AM  Result Value Ref Range   Sodium 131 (L) 135 - 145 mmol/L   Potassium 5.3 (H) 3.5 - 5.1 mmol/L   Chloride 95 (L) 98 - 111 mmol/L   CO2 22 22 - 32 mmol/L   Glucose, Bld 242 (H) 70 - 99 mg/dL    Comment: Glucose reference range applies only to samples taken after fasting for at least 8 hours.   BUN 58 (H) 6 - 20 mg/dL   Creatinine, Ser 2.39 (H) 0.44 - 1.00 mg/dL   Calcium  8.5 (L) 8.9 - 10.3 mg/dL   Phosphorus 7.0 (H) 2.5 - 4.6 mg/dL   Albumin  2.3 (L) 3.5 - 5.0 g/dL   GFR, Estimated 7 (L) >60 mL/min    Comment: (NOTE) Calculated using the CKD-EPI Creatinine Equation (2021)    Anion gap 14 5 - 15    Comment: Performed at [facility] Lab, [address] [place], [place] [address]  Glucose, capillary     Status: Abnormal   Collection Time: 11/05/23  8:13 AM  Result Value Ref Range   Glucose-Capillary 245 (H) 70 - 99 mg/dL    Comment: Glucose reference range applies only to samples taken after fasting for at least 8 hours.  Glucose, capillary     Status: Abnormal   Collection Time: 11/05/23  1:52 PM  Result Value Ref Range   Glucose-Capillary 182 (H) 70 - 99 mg/dL    Comment: Glucose reference range applies only to samples taken after fasting for at least 8 hours.  Glucose, capillary     Status: Abnormal   Collection Time: 11/05/23  2:37 PM  Result Value Ref Range    Glucose-Capillary 156 (H) 70 - 99 mg/dL    Comment: Glucose reference range applies only to samples taken after fasting for at least 8 hours.  Glucose, capillary     Status: Abnormal   Collection Time: 11/05/23  4:12 PM  Result Value Ref Range   Glucose-Capillary 188 (H) 70 - 99 mg/dL    Comment: Glucose reference range applies only to samples taken after fasting for at least 8 hours.  Glucose, capillary     Status: Abnormal   Collection Time: 11/05/23  9:14 PM  Result Value Ref Range   Glucose-Capillary 138 (H) 70 - 99 mg/dL    Comment: Glucose reference range applies only to samples taken after fasting for at least 8 hours.  Glucose, capillary     Status: None   Collection Time: 11/06/23  6:10 AM  Result Value Ref Range   Glucose-Capillary 97 70 - 99 mg/dL    Comment: Glucose reference range applies only to samples taken after fasting for at least 8 hours.  Glucose, capillary     Status: Abnormal   Collection Time: 11/06/23  9:09 AM  Result Value Ref Range   Glucose-Capillary 44 (LL) 70 - 99 mg/dL    Comment: Glucose reference range applies only to samples taken after fasting for at least 8 hours.  Glucose, capillary  Status: Abnormal   Collection Time: 11/06/23  9:39 AM  Result Value Ref Range   Glucose-Capillary 144 (H) 70 - 99 mg/dL    Comment: Glucose reference range applies only to samples taken after fasting for at least 8 hours.  Glucose, capillary     Status: Abnormal   Collection Time: 11/06/23 12:04 PM  Result Value Ref Range   Glucose-Capillary 125 (H) 70 - 99 mg/dL    Comment: Glucose reference range applies only to samples taken after fasting for at least 8 hours.       Assessment and Plan :Ashley Freeman is a 33 y.o. [id number] admitted for uncontrolled type 1 DM and probable right labial abscess.  Recommend CT of pelvis to eval extent and quality of abscess. Start IV antibiotics now.  Reviewed with pharmacy due to extensive allergy list.  Will go  with Vanc and rocephin .  Evne though rocephin  is listed as an allergy, she has tolerated the medication in the past without issue.   Thank you for this consult, we will follow along, please call or re-consult with further questions.   For OB/GYN questions, please call the Center for [facility] at [facility] Monday - Friday, 8 am - 5 pm: [phone] All other times: [phone]    Jerilynn Buddle, MD Attending Obstetrician & Gynecologist, [facility] for [facility], [facility]

## 2023-11-06 NOTE — TOC Progression Note (Signed)
 Transition of Care [facility]) - Progression Note    Patient Details  Name: Ashley Freeman MRN: [id number] Date of Birth: 02/07/1991  Transition of Care [facility]) CM/SW Contact  Roxie KANDICE Stain, RN Phone Number: 11/06/2023, 3:14 PM  Clinical Narrative:     Beatris to Zack with [employer] regarding rollator. Zack states [employer] is attempting to reach patient in regards to the copay. This RNCM notified patient that [employer] is attempting to reach her.  TOC following.   Expected Discharge Plan: Home/Self Care Barriers to Discharge: Continued Medical Work up  Expected Discharge Plan and Services In-house Referral: Clinical Social Work     Living arrangements for the past 2 months: Apartment                                       Social Determinants of Health (SDOH) Interventions SDOH Screenings   Food Insecurity: No Food Insecurity (11/03/2023)  Housing: Low Risk  (11/03/2023)  Transportation Needs: Unmet Transportation Needs (11/03/2023)  Utilities: Not At Risk (11/03/2023)  Depression (PHQ2-9): Medium Risk (03/02/2020)  Financial Resource Strain: Low Risk  (09/18/2023)   Received from [facility]  Physical Activity: Insufficiently Active (03/02/2022)   Received from [facility]  Social Connections: Moderately Integrated (08/28/2023)  Stress: No Stress Concern Present (03/08/2023)   Received from [facility]  Tobacco Use: Low Risk  (11/02/2023)    Readmission Risk Interventions    10/29/2023   10:23 AM 07/23/2023    4:25 PM 07/03/2023   10:14 AM  Readmission Risk Prevention Plan  Transportation Screening Complete Complete Complete  Medication Review Oceanographer) Complete Referral to Pharmacy Complete  PCP or Specialist appointment within 3-5 days of discharge Complete Complete Complete  HRI or Home Care Consult Complete Complete Complete  SW Recovery Care/Counseling Consult Complete Complete Complete  Palliative Care Screening Not Applicable Not Applicable Not  Applicable  Skilled Nursing Facility Not Applicable Not Applicable Not Applicable

## 2023-11-06 NOTE — Plan of Care (Signed)
  Problem: Education: Goal: Knowledge of General Education information will improve Description: Including pain rating scale, medication(s)/side effects and non-pharmacologic comfort measures Outcome: Progressing   Problem: Health Behavior/Discharge Planning: Goal: Ability to manage health-related needs will improve Outcome: Progressing   Problem: Clinical Measurements: Goal: Ability to maintain clinical measurements within normal limits will improve Outcome: Progressing Goal: Will remain free from infection Outcome: Progressing Goal: Diagnostic test results will improve Outcome: Progressing Goal: Respiratory complications will improve Outcome: Progressing Goal: Cardiovascular complication will be avoided Outcome: Progressing   Problem: Activity: Goal: Risk for activity intolerance will decrease Outcome: Progressing   Problem: Nutrition: Goal: Adequate nutrition will be maintained Outcome: Progressing   Problem: Coping: Goal: Level of anxiety will decrease Outcome: Progressing   Problem: Elimination: Goal: Will not experience complications related to bowel motility Outcome: Progressing Goal: Will not experience complications related to urinary retention Outcome: Progressing   Problem: Pain Managment: Goal: General experience of comfort will improve and/or be controlled Outcome: Progressing   Problem: Safety: Goal: Ability to remain free from injury will improve Outcome: Progressing   Problem: Skin Integrity: Goal: Risk for impaired skin integrity will decrease Outcome: Progressing   Problem: Education: Goal: Ability to describe self-care measures that may prevent or decrease complications (Diabetes Survival Skills Education) will improve Outcome: Progressing Goal: Individualized Educational Video(s) Outcome: Progressing   Problem: Coping: Goal: Ability to adjust to condition or change in health will improve Outcome: Progressing   Problem: Health  Behavior/Discharge Planning: Goal: Ability to identify and utilize available resources and services will improve Outcome: Progressing Goal: Ability to manage health-related needs will improve Outcome: Progressing   Problem: Metabolic: Goal: Ability to maintain appropriate glucose levels will improve Outcome: Progressing   Problem: Nutritional: Goal: Maintenance of adequate nutrition will improve Outcome: Progressing Goal: Progress toward achieving an optimal weight will improve Outcome: Progressing   Problem: Skin Integrity: Goal: Risk for impaired skin integrity will decrease Outcome: Progressing   Problem: Tissue Perfusion: Goal: Adequacy of tissue perfusion will improve Outcome: Progressing   Problem: Education: Goal: Ability to describe self-care measures that may prevent or decrease complications (Diabetes Survival Skills Education) will improve Outcome: Progressing Goal: Individualized Educational Video(s) Outcome: Progressing   Problem: Cardiac: Goal: Ability to maintain an adequate cardiac output will improve Outcome: Progressing   Problem: Health Behavior/Discharge Planning: Goal: Ability to identify and utilize available resources and services will improve Outcome: Progressing Goal: Ability to manage health-related needs will improve Outcome: Progressing   Problem: Metabolic: Goal: Ability to maintain appropriate glucose levels will improve Outcome: Progressing   Problem: Nutritional: Goal: Maintenance of adequate nutrition will improve Outcome: Progressing Goal: Maintenance of adequate weight for body size and type will improve Outcome: Progressing   Problem: Respiratory: Goal: Will regain and/or maintain adequate ventilation Outcome: Progressing

## 2023-11-06 NOTE — Progress Notes (Signed)
 PROGRESS NOTE    Ashley Freeman  FMW:[id number] DOB: April 21, 1991 DOA: 11/02/2023 PCP: Keven Crumbly Pap, MD    Brief Narrative:  33 year old with one of the multiple admissions with missed hemodialysis, she has type 1 diabetes on insulin  and recurrent hospitalization. Recently admitted last week with DKA and volume overload, missed 2 dialysis sessions that patient attributed to the transportation not picking her up comes to the emergency room with abdominal distention and leg edema, labial edema In the emergency room was also complaining of vulvar pain that was noted to be due to edema by ER physician, admitted with fluid overload.   Subjective: Patient seen and examined.  Complains of more vulvar pain and asymmetrical swelling.  Did not have much choice to eat due to restricted diet.  Blood sugars 44 in the morning and patient was dizzy.  Assessment & Plan:   Volume overload secondary to missed hemodialysis, tense ascites: Missed dialysis due to transportation issues per patient. Received dialysis 7/6.  7/8, next dialysis 7/10. Patient has significant soft tissue edema, may benefit with additional dialysis to relieve the edema.  Nephrology following.  Anasarca/edema: Paracentesis with 1 L removed.  Fluid management with dialysis..  Asymmetrical valvular swelling/valvular pain: Also with evidence of anasarca and edema.  But patient has asymmetrical swelling and is painful with induration. Case discussed with OB/GYN for consultation.  YYD:Emzdzwuzi with blood glucose of 827.  Initially on insulin  drip.  Improved now.  Currently on home dose of long-acting insulin  10 units-decreasing to 80 units daily.  And prandial insulin  5 units 3 times daily with meals. Known A1c of 10.3.  Compliance is more pressing issue. Hypoglycemic episodes due to poor dinner.  Will keep on similar dose of insulin  today and encourage regular diet.   DVT prophylaxis: Place and maintain sequential  compression device Start: 11/03/23 0046   Code Status: Full code Family Communication: None at bedside Disposition Plan: Status is: Inpatient Remains inpatient appropriate because: Significantly fluid overloaded.      Consultants:  Nephrology OB/GYN  Procedures:  Routine hemodialysis  Antimicrobials:  None     Objective: Vitals:   11/05/23 1940 11/05/23 2322 11/06/23 0615 11/06/23 0740  BP: (!) 143/79 135/75 135/79 136/79  Pulse: 84 80    Resp: 18 16 15 15   Temp: 98.2 F (36.8 C) 98.1 F (36.7 C) 98.1 F (36.7 C) 97.7 F (36.5 C)  TempSrc: Oral Oral Axillary Axillary  SpO2:      Weight:   76.3 kg   Height:        Intake/Output Summary (Last 24 hours) at 11/06/2023 1139 Last data filed at 11/06/2023 0537 Gross per 24 hour  Intake 480 ml  Output 4500 ml  Net -4020 ml   Filed Weights   11/05/23 0945 11/05/23 1315 11/06/23 0615  Weight: 75.8 kg 72.4 kg 76.3 kg    Examination:  General exam: Fairly comfortable today.  Alert after dextrose  infusion. Respiratory system: Clear to auscultation. Respiratory effort normal.  No added sounds. Cardiovascular system: S1 & S2 heard, RRR.  Gastrointestinal system: Soft.  Nontender.  Tense and distended.  No rigidity or guarding. Central nervous system: Alert and oriented. No focal neurological deficits. Extremities: Symmetric 5 x 5 power. Tense edema of both legs. Patient examined with patient's RN as chaperone. Edematous and enlarged right vulva, tender in form.  Asymmetrical edema right more than left.    Data Reviewed: I have personally reviewed following labs and imaging studies  CBC: Recent Labs  Lab 10/31/23 2149 11/02/23 2140 11/02/23 2205 11/03/23 0539 11/05/23 0806  WBC 8.2 10.2  --  12.1* 13.0*  NEUTROABS  --  6.4  --   --   --   HGB 10.5* 10.9* 12.2 11.1* 10.6*  HCT 34.7* 36.3 36.0 35.0* 33.4*  MCV 91.6 89.4  --  87.5 86.8  PLT 351 309  --  367 263   Basic Metabolic Panel: Recent Labs  Lab  10/31/23 2148 10/31/23 2149 11/02/23 2140 11/02/23 2205 11/03/23 0539 11/05/23 0806  NA 126*  127* 129* 127* 124* 130* 131*  K 5.4*  5.4* 5.5* 5.5* 5.4* 4.4 5.3*  CL 94* 90* 90*  --  94* 95*  CO2  --  19* 19*  --  15* 22  GLUCOSE >700* 864* 827*  --  230* 242*  BUN 65* 74* 77*  --  76* 58*  CREATININE 8.40* 8.81* 9.28*  --  9.48* 7.60*  CALCIUM   --  8.4* 8.5*  --  8.9 8.5*  MG  --   --   --   --  2.5*  --   PHOS  --   --   --   --  8.8* 7.0*   GFR: Estimated Creatinine Clearance: 10.5 mL/min (A) (by C-G formula based on SCr of 7.6 mg/dL (H)). Liver Function Tests: Recent Labs  Lab 10/31/23 2149 11/03/23 0539 11/05/23 0806  AST 24 49*  --   ALT 21 28  --   ALKPHOS 440* 472*  --   BILITOT 0.9 0.8  --   PROT 5.5* 5.3*  --   ALBUMIN  2.6* 2.6* 2.3*   No results for input(s): LIPASE, AMYLASE in the last 168 hours. No results for input(s): AMMONIA in the last 168 hours. Coagulation Profile: No results for input(s): INR, PROTIME in the last 168 hours. Cardiac Enzymes: No results for input(s): CKTOTAL, CKMB, CKMBINDEX, TROPONINI in the last 168 hours. BNP (last 3 results) No results for input(s): PROBNP in the last 8760 hours. HbA1C: No results for input(s): HGBA1C in the last 72 hours. CBG: Recent Labs  Lab 11/05/23 1612 11/05/23 2114 11/06/23 0610 11/06/23 0909 11/06/23 0939  GLUCAP 188* 138* 97 44* 144*   Lipid Profile: No results for input(s): CHOL, HDL, LDLCALC, TRIG, CHOLHDL, LDLDIRECT in the last 72 hours. Thyroid  Function Tests: No results for input(s): TSH, T4TOTAL, FREET4, T3FREE, THYROIDAB in the last 72 hours. Anemia Panel: No results for input(s): VITAMINB12, FOLATE, FERRITIN, TIBC, IRON , RETICCTPCT in the last 72 hours. Sepsis Labs: No results for input(s): PROCALCITON, LATICACIDVEN in the last 168 hours.  Recent Results (from the past 240 hours)  MRSA Next Gen by PCR, Nasal     Status:  None   Collection Time: 11/03/23  5:51 AM   Specimen: Nasal Mucosa; Nasal Swab  Result Value Ref Range Status   MRSA by PCR Next Gen NOT DETECTED NOT DETECTED Final    Comment: (NOTE) The GeneXpert MRSA Assay (FDA approved for NASAL specimens only), is one component of a comprehensive MRSA colonization surveillance program. It is not intended to diagnose MRSA infection nor to guide or monitor treatment for MRSA infections. Test performance is not FDA approved in patients less than 59 years old. Performed at [facility], [address] [place], [place] [address]   Gram stain     Status: None   Collection Time: 11/04/23 11:14 AM   Specimen: Abdomen; Peritoneal Fluid  Result Value Ref Range Status   Specimen Description ABDOMEN  Final   Special Requests NONE  Final   Gram Stain   Final    NO WBC SEEN NO ORGANISMS SEEN Performed at [facility], [address], [place], [place] [address]    Report Status 11/04/2023 FINAL  Final  Culture, body fluid w Gram Stain-bottle     Status: None (Preliminary result)   Collection Time: 11/04/23 11:15 AM   Specimen: Peritoneal Washings  Result Value Ref Range Status   Specimen Description PERITONEAL  Final   Special Requests NONE  Final   Culture   Final    NO GROWTH 2 DAYS Performed at [facility], [address] [place], [place] [address]    Report Status PENDING  Incomplete         Radiology Studies: No results found.       Scheduled Meds:  atorvastatin   80 mg Oral Daily   bumetanide   2 mg Oral Daily   calcitRIOL   1.25 mcg Oral Q T,Th,Sat-1800   carvedilol   12.5 mg Oral BID WC   Chlorhexidine  Gluconate Cloth  6 each Topical Q0600   DULoxetine   20 mg Oral BID   famotidine   20 mg Oral QAC breakfast   insulin  aspart  0-6 Units Subcutaneous TID WC   insulin  aspart  5 Units Subcutaneous TID WC   insulin  glargine-yfgn  10 Units Subcutaneous Daily   lamoTRIgine   200 mg Oral Daily    lipase/protease/amylase  12,000 Units Oral Daily   losartan   150 mg Oral Daily   OLANZapine  zydis  5 mg Oral QHS   sevelamer  carbonate  2,400 mg Oral TID WC   sodium bicarbonate   1,300 mg Oral BID   sodium chloride  flush  3 mL Intravenous Q12H   Continuous Infusions:  insulin  Stopped (11/03/23 1154)     LOS: 3 days    Time spent: 40 minutes    Renato Applebaum, MD [facility]

## 2023-11-07 DIAGNOSIS — E1100 Type 2 diabetes mellitus with hyperosmolarity without nonketotic hyperglycemic-hyperosmolar coma (NKHHC): Secondary | ICD-10-CM | POA: Diagnosis not present

## 2023-11-07 LAB — CBC
HCT: 32.5 % — ABNORMAL LOW (ref 36.0–46.0)
Hemoglobin: 10 g/dL — ABNORMAL LOW (ref 12.0–15.0)
MCH: 27 pg (ref 26.0–34.0)
MCHC: 30.8 g/dL (ref 30.0–36.0)
MCV: 87.8 fL (ref 80.0–100.0)
Platelets: 260 K/uL (ref 150–400)
RBC: 3.7 MIL/uL — ABNORMAL LOW (ref 3.87–5.11)
RDW: 14.8 % (ref 11.5–15.5)
WBC: 10.5 K/uL (ref 4.0–10.5)
nRBC: 0 % (ref 0.0–0.2)

## 2023-11-07 LAB — GLUCOSE, CAPILLARY
Glucose-Capillary: 233 mg/dL — ABNORMAL HIGH (ref 70–99)
Glucose-Capillary: 198 mg/dL — ABNORMAL HIGH (ref 70–99)
Glucose-Capillary: 293 mg/dL — ABNORMAL HIGH (ref 70–99)

## 2023-11-07 LAB — RENAL FUNCTION PANEL
Albumin: 2.2 g/dL — ABNORMAL LOW (ref 3.5–5.0)
Anion gap: 15 (ref 5–15)
BUN: 55 mg/dL — ABNORMAL HIGH (ref 6–20)
CO2: 23 mmol/L (ref 22–32)
Calcium: 8.5 mg/dL — ABNORMAL LOW (ref 8.9–10.3)
Chloride: 95 mmol/L — ABNORMAL LOW (ref 98–111)
Creatinine, Ser: 7.14 mg/dL — ABNORMAL HIGH (ref 0.44–1.00)
GFR, Estimated: 7 mL/min — ABNORMAL LOW (ref >60)
Glucose, Bld: 235 mg/dL — ABNORMAL HIGH (ref 70–99)
Phosphorus: 6.9 mg/dL — ABNORMAL HIGH (ref 2.5–4.6)
Potassium: 5.2 mmol/L — ABNORMAL HIGH (ref 3.5–5.1)
Sodium: 133 mmol/L — ABNORMAL LOW (ref 135–145)

## 2023-11-07 MED ORDER — HEPARIN SODIUM (PORCINE) 1000 UNIT/ML IJ SOLN
INTRAMUSCULAR | Status: AC
Start: 2023-11-07 — End: 2023-11-07
  Filled 2023-11-07: qty 1

## 2023-11-07 MED ORDER — LIDOCAINE-PRILOCAINE 2.5-2.5 % EX CREA: 1.0000 | TOPICAL_CREAM | CUTANEOUS | Status: DC | PRN

## 2023-11-07 MED ORDER — HEPARIN SODIUM (PORCINE) 1000 UNIT/ML DIALYSIS
2500.0000 [IU] | Freq: Once | INTRAMUSCULAR | Status: AC
  Administered 2023-11-07: 2500 [IU] via INTRAVENOUS_CENTRAL

## 2023-11-07 MED ORDER — HYDROMORPHONE HCL 2 MG PO TABS
ORAL_TABLET | ORAL | Status: AC
  Filled 2023-11-07: qty 2

## 2023-11-07 MED ORDER — ALTEPLASE 2 MG IJ SOLR
2.0000 mg | Freq: Once | INTRAMUSCULAR | Status: DC | PRN
Start: 2023-11-07 — End: 2023-11-07

## 2023-11-07 MED ORDER — HYDROMORPHONE HCL 2 MG PO TABS
4.0000 mg | ORAL_TABLET | ORAL | Status: DC | PRN
  Administered 2023-11-07 (×2): 4 mg via ORAL
  Filled 2023-11-07 (×3): qty 2

## 2023-11-07 MED ORDER — LIDOCAINE HCL (PF) 1 % IJ SOLN: 5.0000 mL | INTRAMUSCULAR | Status: DC | PRN

## 2023-11-07 MED ORDER — PENTAFLUOROPROP-TETRAFLUOROETH EX AERO: 1.0000 | INHALATION_SPRAY | CUTANEOUS | Status: DC | PRN

## 2023-11-07 MED ORDER — NEPRO/CARBSTEADY PO LIQD: 237.0000 mL | ORAL | Status: DC | PRN

## 2023-11-07 MED ORDER — HEPARIN SODIUM (PORCINE) 1000 UNIT/ML DIALYSIS: 1000.0000 [IU] | INTRAMUSCULAR | Status: DC | PRN

## 2023-11-07 MED ORDER — HYDROMORPHONE HCL 1 MG/ML IJ SOLN
0.5000 mg | INTRAMUSCULAR | Status: AC | PRN
  Administered 2023-11-07 – 2023-11-08 (×2): 0.5 mg via INTRAVENOUS
  Filled 2023-11-07: qty 0.5

## 2023-11-07 MED ORDER — NEPRO/CARBSTEADY PO LIQD
237.0000 mL | ORAL | Status: DC | PRN
Start: 2023-11-07 — End: 2023-11-07
  Filled 2023-11-07: qty 237

## 2023-11-07 MED ORDER — ANTICOAGULANT SODIUM CITRATE 4% (200MG/5ML) IV SOLN: 5.0000 mL | Status: DC | PRN

## 2023-11-07 MED ORDER — HYDROMORPHONE HCL 1 MG/ML IJ SOLN
INTRAMUSCULAR | Status: AC
  Filled 2023-11-07: qty 0.5

## 2023-11-07 MED ORDER — ALTEPLASE 2 MG IJ SOLR: 2.0000 mg | Freq: Once | INTRAMUSCULAR | Status: DC | PRN

## 2023-11-07 MED ORDER — ANTICOAGULANT SODIUM CITRATE 4% (200MG/5ML) IV SOLN
5.0000 mL | Status: DC | PRN
  Filled 2023-11-07: qty 5

## 2023-11-07 MED ORDER — HEPARIN SODIUM (PORCINE) 1000 UNIT/ML DIALYSIS
1500.0000 [IU] | INTRAMUSCULAR | Status: AC | PRN
Start: 2023-11-08 — End: 2023-11-07
  Administered 2023-11-07: 1500 [IU] via INTRAVENOUS_CENTRAL

## 2023-11-07 NOTE — Progress Notes (Signed)
 PROGRESS NOTE    TWANDA STAKES  FMW:[id number] DOB: 03/18/1991 DOA: 11/02/2023 PCP: Keven Crumbly Pap, MD    Brief Narrative:  33 year old with one of the multiple admissions with missed hemodialysis, she has type 1 diabetes on insulin  and recurrent hospitalization. Recently admitted last week with DKA and volume overload, missed 2 dialysis sessions that patient attributed to the transportation not picking her up comes to the emergency room with abdominal distention and leg edema, labial edema In the emergency room was also complaining of vulvar pain that was noted to be due to edema by ER physician, admitted with fluid overload.   Subjective: Patient seen and examined.  She is clearly distressed about the perineal swelling and worried about not having surgery. CT scan did not show abscess, it only showed fluid collection.  OB/GYN to follow-up.  Sugars are better.  For dialysis today.  Assessment & Plan:   Volume overload secondary to missed hemodialysis, anasarca and ascites. Missed dialysis due to transportation issues per patient. Received dialysis 7/6.  7/8, next dialysis today. Patient has significant soft tissue edema, may benefit with additional dialysis to relieve the edema.  Nephrology following.  Anasarca/edema: Paracentesis with 1 L removed.  Fluid management with dialysis..  Asymmetrical valvular swelling/valvular pain: Also with evidence of anasarca and edema.  But patient has asymmetrical swelling and is painful with induration. CT scan without evidence of abscess, however with evidence of fluid collections. OB/GYN to follow-up.  Started on antibiotics with Rocephin  and vancomycin .  YYD:Emzdzwuzi with blood glucose of 827.  Initially on insulin  drip.  Improved now.. Hypoglycemic on home dose of insulin .  Decreasing dose from long-acting insulin  10 units-8 units, prandial insulin  5 units 3 times daily-4 units 3 times daily.  A1c more than 10.  Known  noncompliance.  DVT prophylaxis: Place and maintain sequential compression device Start: 11/03/23 0046   Code Status: Full code Family Communication: None at bedside Disposition Plan: Status is: Inpatient Remains inpatient appropriate because: Significantly fluid overloaded.  Needs subsequent dialysis.     Consultants:  Nephrology OB/GYN  Procedures:  Routine hemodialysis  Antimicrobials:  Vancomycin  and Rocephin  7/9---     Objective: Vitals:   11/06/23 2010 11/06/23 2300 11/07/23 0436 11/07/23 0743  BP: 132/75 130/78 133/74 (!) 143/88  Pulse:  74 75 80  Resp: 15 14 11    Temp: 98.4 F (36.9 C) 98.2 F (36.8 C) 98.2 F (36.8 C) 99.5 F (37.5 C)  TempSrc: Oral Oral Oral Oral  SpO2: 92%  93%   Weight:   76.9 kg   Height:        Intake/Output Summary (Last 24 hours) at 11/07/2023 1052 Last data filed at 11/06/2023 2109 Gross per 24 hour  Intake 616.63 ml  Output --  Net 616.63 ml   Filed Weights   11/05/23 1315 11/06/23 0615 11/07/23 0436  Weight: 72.4 kg 76.3 kg 76.9 kg    Examination:  General exam: Looks comfortable but anxious. Respiratory system: Clear to auscultation. Respiratory effort normal.  No added sounds. Cardiovascular system: S1 & S2 heard, RRR.  Gastrointestinal system: Soft.  Nontender.  Tense and distended.  No rigidity or guarding. Central nervous system: Alert and oriented. No focal neurological deficits. Extremities: Symmetric 5 x 5 power. Tense edema of both legs. Patient interviewed and examined with patient's RN as chaperone. Edematous and enlarged right vulva, tender to touch.  Asymmetrical edema right more than left.    Data Reviewed: I have personally reviewed following labs and imaging  studies  CBC: Recent Labs  Lab 10/31/23 2149 11/02/23 2140 11/02/23 2205 11/03/23 0539 11/05/23 0806  WBC 8.2 10.2  --  12.1* 13.0*  NEUTROABS  --  6.4  --   --   --   HGB 10.5* 10.9* 12.2 11.1* 10.6*  HCT 34.7* 36.3 36.0 35.0* 33.4*   MCV 91.6 89.4  --  87.5 86.8  PLT 351 309  --  367 263   Basic Metabolic Panel: Recent Labs  Lab 10/31/23 2148 10/31/23 2149 11/02/23 2140 11/02/23 2205 11/03/23 0539 11/05/23 0806  NA 126*  127* 129* 127* 124* 130* 131*  K 5.4*  5.4* 5.5* 5.5* 5.4* 4.4 5.3*  CL 94* 90* 90*  --  94* 95*  CO2  --  19* 19*  --  15* 22  GLUCOSE >700* 864* 827*  --  230* 242*  BUN 65* 74* 77*  --  76* 58*  CREATININE 8.40* 8.81* 9.28*  --  9.48* 7.60*  CALCIUM   --  8.4* 8.5*  --  8.9 8.5*  MG  --   --   --   --  2.5*  --   PHOS  --   --   --   --  8.8* 7.0*   GFR: Estimated Creatinine Clearance: 10.5 mL/min (A) (by C-G formula based on SCr of 7.6 mg/dL (H)). Liver Function Tests: Recent Labs  Lab 10/31/23 2149 11/03/23 0539 11/05/23 0806  AST 24 49*  --   ALT 21 28  --   ALKPHOS 440* 472*  --   BILITOT 0.9 0.8  --   PROT 5.5* 5.3*  --   ALBUMIN  2.6* 2.6* 2.3*   No results for input(s): LIPASE, AMYLASE in the last 168 hours. No results for input(s): AMMONIA in the last 168 hours. Coagulation Profile: No results for input(s): INR, PROTIME in the last 168 hours. Cardiac Enzymes: No results for input(s): CKTOTAL, CKMB, CKMBINDEX, TROPONINI in the last 168 hours. BNP (last 3 results) No results for input(s): PROBNP in the last 8760 hours. HbA1C: No results for input(s): HGBA1C in the last 72 hours. CBG: Recent Labs  Lab 11/06/23 0939 11/06/23 1204 11/06/23 1408 11/06/23 1633 11/06/23 2057  GLUCAP 144* 125* 160* 222* 211*   Lipid Profile: No results for input(s): CHOL, HDL, LDLCALC, TRIG, CHOLHDL, LDLDIRECT in the last 72 hours. Thyroid  Function Tests: No results for input(s): TSH, T4TOTAL, FREET4, T3FREE, THYROIDAB in the last 72 hours. Anemia Panel: No results for input(s): VITAMINB12, FOLATE, FERRITIN, TIBC, IRON , RETICCTPCT in the last 72 hours. Sepsis Labs: No results for input(s): PROCALCITON, LATICACIDVEN in  the last 168 hours.  Recent Results (from the past 240 hours)  MRSA Next Gen by PCR, Nasal     Status: None   Collection Time: 11/03/23  5:51 AM   Specimen: Nasal Mucosa; Nasal Swab  Result Value Ref Range Status   MRSA by PCR Next Gen NOT DETECTED NOT DETECTED Final    Comment: (NOTE) The GeneXpert MRSA Assay (FDA approved for NASAL specimens only), is one component of a comprehensive MRSA colonization surveillance program. It is not intended to diagnose MRSA infection nor to guide or monitor treatment for MRSA infections. Test performance is not FDA approved in patients less than 78 years old. Performed at [facility], [address] [place], [place] [address]   Gram stain     Status: None   Collection Time: 11/04/23 11:14 AM   Specimen: Abdomen; Peritoneal Fluid  Result Value Ref Range  Status   Specimen Description ABDOMEN  Final   Special Requests NONE  Final   Gram Stain   Final    NO WBC SEEN NO ORGANISMS SEEN Performed at [facility], [address] [place], [place] [address]    Report Status 11/04/2023 FINAL  Final  Culture, body fluid w Gram Stain-bottle     Status: None (Preliminary result)   Collection Time: 11/04/23 11:15 AM   Specimen: Peritoneal Washings  Result Value Ref Range Status   Specimen Description PERITONEAL  Final   Special Requests NONE  Final   Culture   Final    NO GROWTH 3 DAYS Performed at [facility], [address], [place], [place] [address]    Report Status PENDING  Incomplete         Radiology Studies: CT ABDOMEN PELVIS W CONTRAST Result Date: 11/06/2023 CLINICAL DATA:  Diabetes recent DKA abdominal distension and leg edema fluid overload EXAM: CT ABDOMEN AND PELVIS WITH CONTRAST TECHNIQUE: Multidetector CT imaging of the abdomen and pelvis was performed using the standard protocol following bolus administration of intravenous contrast. RADIATION DOSE REDUCTION: This exam was performed according to the  departmental dose-optimization program which includes automated exposure control, adjustment of the mA and/or kV according to patient size and/or use of iterative reconstruction technique. CONTRAST:  60mL OMNIPAQUE  IOHEXOL  350 MG/ML SOLN COMPARISON:  CT 08/27/2023, 07/19/2023 FINDINGS: Lower chest: Lung bases demonstrate no acute airspace disease. Cardiomegaly. Trace right pleural effusion. Hepatobiliary: Liver is enlarged, craniocaudal measurement of 23 cm. Diffuse heterogenous liver parenchymal density. Cholecystectomy. Stable crescent shaped density the gallbladder fossa, may reflect a small chronic fluid collection or postsurgical change. No biliary dilatation Pancreas: Unremarkable. No pancreatic ductal dilatation or surrounding inflammatory changes. Spleen: Normal in size without focal abnormality. Adrenals/Urinary Tract: Adrenal glands are normal. Kidneys show no hydronephrosis. The bladder is diffusely thick walled but nearly empty Stomach/Bowel: Moderate debris in the stomach. Mild diffuse air distension of the small bowel without transition point. Moderate stool burden. Negative appendix. Vascular/Lymphatic: Nonaneurysmal aorta. Atherosclerosis. Multiple mildly prominent retroperitoneal nodes, less than a cm. Reproductive: Uterus and bilateral adnexa are unremarkable. Other: No free air. Moderate volume of ascites in the abdomen with moderate large pelvic ascites. Considerable subcutaneous edema. Large amount of fluid at the labia. Musculoskeletal: No acute osseous abnormality IMPRESSION: 1. Hepatomegaly with heterogenous appearance of the liver which could be secondary to passive congestion, or potential hepatic inflammation/hepatocellular disease. 2. Moderate volume of ascites in the abdomen with moderate to large pelvic ascites. Considerable subcutaneous edema consistent with anasarca. Large amount of fluid at the labia 3. Mild diffuse air distension of the small bowel without transition point, possible  ileus. 4. Cardiomegaly. Trace right pleural effusion. Electronically Signed   By: Luke Bun M.D.   On: 11/06/2023 20:18         Scheduled Meds:  atorvastatin   80 mg Oral Daily   bumetanide   2 mg Oral Daily   calcitRIOL   1.25 mcg Oral Q T,Th,Sat-1800   carvedilol   12.5 mg Oral BID WC   Chlorhexidine  Gluconate Cloth  6 each Topical Q0600   DULoxetine   20 mg Oral BID   famotidine   20 mg Oral QAC breakfast   insulin  aspart  0-6 Units Subcutaneous TID WC   insulin  aspart  4 Units Subcutaneous TID WC   insulin  glargine-yfgn  8 Units Subcutaneous Daily   lamoTRIgine   200 mg Oral Daily   lipase/protease/amylase  12,000 Units Oral Daily  losartan   150 mg Oral Daily   OLANZapine  zydis  5 mg Oral QHS   sevelamer  carbonate  2,400 mg Oral TID WC   sodium bicarbonate   1,300 mg Oral BID   sodium chloride  flush  3 mL Intravenous Q12H   Continuous Infusions:  cefTRIAXone  (ROCEPHIN )  IV 2 g (11/06/23 1422)   vancomycin        LOS: 4 days    Time spent: 40 minutes    Renato Applebaum, MD [facility]

## 2023-11-07 NOTE — Progress Notes (Signed)
 [facility] Progress Note  Subjective:  Seen in room Pt expecting surgery today, no other issues  Vitals:   11/06/23 2010 11/06/23 2300 11/07/23 0436 11/07/23 0743  BP: 132/75 130/78 133/74 (!) 143/88  Pulse:  74 75 80  Resp: 15 14 11    Temp: 98.4 F (36.9 C) 98.2 F (36.8 C) 98.2 F (36.8 C) 99.5 F (37.5 C)  TempSrc: Oral Oral Oral Oral  SpO2: 92%  93%   Weight:   76.9 kg   Height:        Exam: Gen: NAD, comfortable, on RA, lying flat Respiratory: Clear bilateral, no rales Cardiovascular: Regular rate rhythm S1-S2 normal, no rubs GI: Abdomen soft, nontender,+distended Extremities: diffuse bilat LE/ hip edema Neurology: Alert, awake, following commands   AV fistula+t/b      OP HD: TTS [facility] 3h  60kg   B350  AVG   Heparin  1600 + 600u/hr  Assessment/Plan: Volume overload: due to missed dialysis treatment associated with ascites. Might be a chronic problem. Had HD 7/06 and 7/08 w/ 4 L off x 2. Still diffuse hip/ LE edema. Paracentesis w/ only 1.5 L removed. Standing wt 72.4kg on 7/08. Have reinforced fluid restriction w/ the patient and changed the diet from carb mod ->  renal / carb mod w/ 1.2 L fluid restriction. Plan is for HD sometime today/ tonight.  ESRD: TTS at [facility] in [place].  Misses dialysis frequently. Patient reported that she misses HD due to a transportation problem. HD today.  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   Myer Fret MD  [facility] 11/07/2023, 10:33 AM  Recent Labs  Lab 11/03/23 0539 11/05/23 0806  HGB 11.1* 10.6*  ALBUMIN  2.6* 2.3*  CALCIUM  8.9 8.5*  PHOS 8.8* 7.0*  CREATININE 9.48* 7.60*  K 4.4 5.3*   No results for input(s): IRON , TIBC, FERRITIN in the last 168 hours. Inpatient medications:  atorvastatin   80 mg Oral Daily   bumetanide   2 mg Oral Daily   calcitRIOL   1.25 mcg Oral Q T,Th,Sat-1800   carvedilol   12.5 mg Oral BID WC    Chlorhexidine  Gluconate Cloth  6 each Topical Q0600   DULoxetine   20 mg Oral BID   famotidine   20 mg Oral QAC breakfast   insulin  aspart  0-6 Units Subcutaneous TID WC   insulin  aspart  4 Units Subcutaneous TID WC   insulin  glargine-yfgn  8 Units Subcutaneous Daily   lamoTRIgine   200 mg Oral Daily   lipase/protease/amylase  12,000 Units Oral Daily   losartan   150 mg Oral Daily   OLANZapine  zydis  5 mg Oral QHS   sevelamer  carbonate  2,400 mg Oral TID WC   sodium bicarbonate   1,300 mg Oral BID   sodium chloride  flush  3 mL Intravenous Q12H    cefTRIAXone  (ROCEPHIN )  IV 2 g (11/06/23 1422)   vancomycin      acetaminophen , albuterol , dextrose , dicyclomine , diphenhydrAMINE , hydrALAZINE , melatonin, metoCLOPramide  (REGLAN ) injection, naLOXone  (NARCAN )  injection, ondansetron  (ZOFRAN ) IV, oxyCODONE  **OR** [DISCONTINUED] oxyCODONE , polyethylene glycol

## 2023-11-07 NOTE — Consult Note (Signed)
 OBSTETRICS AND GYNECOLOGY ATTENDING CONSULT  FOLLOW UP NOTE  Consult Date: 11/07/2023 Reason for Consult: Labial swelling, in the setting of poorly controlled T1DM, HFrEF and diffuse ascites and anasarca Consulting Provider: Renato Applebaum, MD  Subjective: Today, patient reports continued pain and discomfort around her labia. No fevers/chills/drainage.  Patient is still on IV Vancomycin  and Rocephin .  Objective: Blood pressure 106/61, pulse 74, temperature 98.3 F (36.8 C), temperature source Oral, resp. rate 11, height 5' 3 (1.6 m), weight 76.9 kg, last menstrual period 01/29/2020, SpO2 93%. Focused pelvic exam (done with Wyline Core, RN as chaperone): Marked edema of bilateral labia, R>>L, no erythema, no ulceration, no drainage, no fluctuance.  Studies:    Latest Ref Rng & Units 11/05/2023    8:06 AM 11/03/2023    5:39 AM 11/02/2023   10:05 PM  CBC  WBC 4.0 - 10.5 K/uL 13.0  12.1    Hemoglobin 12.0 - 15.0 g/dL 89.3  88.8  87.7   Hematocrit 36.0 - 46.0 % 33.4  35.0  36.0   Platelets 150 - 400 K/uL 263  367      CT ABDOMEN PELVIS W CONTRAST Result Date: 11/06/2023 CLINICAL DATA:  Diabetes recent DKA abdominal distension and leg edema fluid overload EXAM: CT ABDOMEN AND PELVIS WITH CONTRAST TECHNIQUE: Multidetector CT imaging of the abdomen and pelvis was performed using the standard protocol following bolus administration of intravenous contrast. RADIATION DOSE REDUCTION: This exam was performed according to the departmental dose-optimization program which includes automated exposure control, adjustment of the mA and/or kV according to patient size and/or use of iterative reconstruction technique. CONTRAST:  60mL OMNIPAQUE  IOHEXOL  350 MG/ML SOLN COMPARISON:  CT 08/27/2023, 07/19/2023 FINDINGS: Lower chest: Lung bases demonstrate no acute airspace disease. Cardiomegaly. Trace right pleural effusion. Hepatobiliary: Liver is enlarged, craniocaudal measurement of 23 cm. Diffuse heterogenous  liver parenchymal density. Cholecystectomy. Stable crescent shaped density the gallbladder fossa, may reflect a small chronic fluid collection or postsurgical change. No biliary dilatation Pancreas: Unremarkable. No pancreatic ductal dilatation or surrounding inflammatory changes. Spleen: Normal in size without focal abnormality. Adrenals/Urinary Tract: Adrenal glands are normal. Kidneys show no hydronephrosis. The bladder is diffusely thick walled but nearly empty Stomach/Bowel: Moderate debris in the stomach. Mild diffuse air distension of the small bowel without transition point. Moderate stool burden. Negative appendix. Vascular/Lymphatic: Nonaneurysmal aorta. Atherosclerosis. Multiple mildly prominent retroperitoneal nodes, less than a cm. Reproductive: Uterus and bilateral adnexa are unremarkable. Other: No free air. Moderate volume of ascites in the abdomen with moderate large pelvic ascites. Considerable subcutaneous edema. Large amount of fluid at the labia. Musculoskeletal: No acute osseous abnormality IMPRESSION: 1. Hepatomegaly with heterogenous appearance of the liver which could be secondary to passive congestion, or potential hepatic inflammation/hepatocellular disease. 2. Moderate volume of ascites in the abdomen with moderate to large pelvic ascites. Considerable subcutaneous edema consistent with anasarca. Large amount of fluid at the labia 3. Mild diffuse air distension of the small bowel without transition point, possible ileus. 4. Cardiomegaly. Trace right pleural effusion. Electronically Signed   By: Luke Bun M.D.   On: 11/06/2023 20:18   Assessment/Plan: There is no evidence of a labial abscess that could be incised and drained, the fluid in the labia is a manifestation of her total fluid overload/anasarca.  This is analogous to when significant scrotal edema is noted in female patients with anasarca, this is not an indication for operating in that scenario either.  This is not as a  result of a gynecologic  etiology.   No surgical intervention is needed for now; treatment should focus on the underlying cause of the fluid overload/anasarca, and this treatment can help reduce the edema in the labia. Will defer treatment modalities for this to her primary team and other specialists.   She can get pain medication as needed for the discomfort in her labia, she may also benefit from cool/cold compresses to the area to help with her discomfort.   If her labial symptoms worsen and there is evidence that the fluid has coalesced into an abscess, we will re-evaluate at that point.  I am hoping her antibiotics will help prevent this from happening for now, but I am unclear about how long to continue the antibiotics and will defer that decision to her primary team.   Appreciate care of Aldean RAMAN [place] by her primary team and her other specialists.  We will follow along and are always available for any gynecologic concerns at any time. Please call [phone] [facility] OB/GYN Consult Attending Monday-Friday 8am - 5pm) or [phone] [facility] OB/GYN Attending On Call all day, every day) as needed any time.  Thank you for involving us  in the care of this patient.  Total consultation time including face-to-face time with patient (>50% of time), reviewing chart and documentation: 55 minutes   GLORIS HUGGER, MD, FACOG Obstetrician & Gynecologist, [facility] for [employer], [facility]

## 2023-11-07 NOTE — Plan of Care (Signed)
   Problem: Education: Goal: Knowledge of General Education information will improve Description: Including pain rating scale, medication(s)/side effects and non-pharmacologic comfort measures Outcome: Progressing   Problem: Health Behavior/Discharge Planning: Goal: Ability to manage health-related needs will improve Outcome: Progressing   Problem: Clinical Measurements: Goal: Ability to maintain clinical measurements within normal limits will improve Outcome: Progressing Goal: Will remain free from infection Outcome: Progressing Goal: Diagnostic test results will improve Outcome: Progressing Goal: Respiratory complications will improve Outcome: Progressing Goal: Cardiovascular complication will be avoided Outcome: Progressing   Problem: Activity: Goal: Risk for activity intolerance will decrease Outcome: Progressing   Problem: Nutrition: Goal: Adequate nutrition will be maintained Outcome: Progressing   Problem: Coping: Goal: Level of anxiety will decrease Outcome: Progressing   Problem: Elimination: Goal: Will not experience complications related to bowel motility Outcome: Progressing Goal: Will not experience complications related to urinary retention Outcome: Progressing   Problem: Pain Managment: Goal: General experience of comfort will improve and/or be controlled Outcome: Progressing   Problem: Safety: Goal: Ability to remain free from injury will improve Outcome: Progressing   Problem: Skin Integrity: Goal: Risk for impaired skin integrity will decrease Outcome: Progressing   Problem: Education: Goal: Ability to describe self-care measures that may prevent or decrease complications (Diabetes Survival Skills Education) will improve Outcome: Progressing Goal: Individualized Educational Video(s) Outcome: Progressing   Problem: Coping: Goal: Ability to adjust to condition or change in health will improve Outcome: Progressing   Problem: Fluid  Volume: Goal: Ability to maintain a balanced intake and output will improve Outcome: Progressing   Problem: Health Behavior/Discharge Planning: Goal: Ability to identify and utilize available resources and services will improve Outcome: Progressing Goal: Ability to manage health-related needs will improve Outcome: Progressing   Problem: Metabolic: Goal: Ability to maintain appropriate glucose levels will improve Outcome: Progressing   Problem: Nutritional: Goal: Maintenance of adequate nutrition will improve Outcome: Progressing Goal: Progress toward achieving an optimal weight will improve Outcome: Progressing   Problem: Skin Integrity: Goal: Risk for impaired skin integrity will decrease Outcome: Progressing   Problem: Tissue Perfusion: Goal: Adequacy of tissue perfusion will improve Outcome: Progressing   Problem: Education: Goal: Ability to describe self-care measures that may prevent or decrease complications (Diabetes Survival Skills Education) will improve Outcome: Progressing Goal: Individualized Educational Video(s) Outcome: Progressing   Problem: Cardiac: Goal: Ability to maintain an adequate cardiac output will improve Outcome: Progressing   Problem: Health Behavior/Discharge Planning: Goal: Ability to identify and utilize available resources and services will improve Outcome: Progressing Goal: Ability to manage health-related needs will improve Outcome: Progressing   Problem: Fluid Volume: Goal: Ability to achieve a balanced intake and output will improve Outcome: Progressing   Problem: Metabolic: Goal: Ability to maintain appropriate glucose levels will improve Outcome: Progressing   Problem: Nutritional: Goal: Maintenance of adequate nutrition will improve Outcome: Progressing Goal: Maintenance of adequate weight for body size and type will improve Outcome: Progressing   Problem: Respiratory: Goal: Will regain and/or maintain adequate  ventilation Outcome: Progressing   Problem: Urinary Elimination: Goal: Ability to achieve and maintain adequate renal perfusion and functioning will improve Outcome: Progressing

## 2023-11-07 NOTE — Progress Notes (Signed)
 Received patient in bed to unit.  Alert and oriented.  Informed consent signed and in chart.   TX duration:3  Patient tolerated well.  Transported back to the room  Alert, without acute distress.  Hand-off given to patient's nurse.   Access used: AVG Access issues:n/a  Total UF removed: Medication(s) given: 0.5Mg  dilaudid  Post HD weight: 69.6kg   11/07/23 1823  Vitals  Temp 98.6 F (37 C)  BP (!) 166/98  Pulse Rate 90  Resp 19  Oxygen Therapy  SpO2 95 %  O2 Device Room Air  Patient Activity (if Appropriate) In bed  Pulse Oximetry Type Continuous  Oximetry Probe Site Changed No  During Treatment Monitoring  Blood Flow Rate (mL/min) 299 mL/min  Arterial Pressure (mmHg) -143.83 mmHg  Venous Pressure (mmHg) 197.97 mmHg  TMP (mmHg) 37.37 mmHg  Ultrafiltration Rate (mL/min) 1941 mL/min  Dialysate Flow Rate (mL/min) 300 ml/min  Dialysate Potassium Concentration 3  Dialysate Calcium  Concentration 2.5  Duration of HD Treatment -hour(s) 2.96 hour(s)  Cumulative Fluid Removed (mL) per Treatment  4817.06  HD Safety Checks Performed Yes  Intra-Hemodialysis Comments Tx completed  Post Treatment  Dialyzer Clearance Lightly streaked  Liters Processed 53.8  Fluid Removed (mL) 4900 mL  Tolerated HD Treatment Yes  AVG/AVF Arterial Site Held (minutes) 6 minutes  AVG/AVF Venous Site Held (minutes) 6 minutes  Fistula / Graft Right Upper arm Arteriovenous vein graft  Placement Date/Time: 07/06/22 1013   Placed prior to admission: No  Orientation: Right  Access Location: Upper arm  Access Type: Arteriovenous vein graft  Site Condition No complications  Fistula / Graft Assessment Present;Thrill;Bruit  Status Deaccessed     [facility]

## 2023-11-08 DIAGNOSIS — E1100 Type 2 diabetes mellitus with hyperosmolarity without nonketotic hyperglycemic-hyperosmolar coma (NKHHC): Secondary | ICD-10-CM | POA: Diagnosis not present

## 2023-11-08 LAB — GLUCOSE, CAPILLARY
Glucose-Capillary: 325 mg/dL — ABNORMAL HIGH (ref 70–99)
Glucose-Capillary: 405 mg/dL — ABNORMAL HIGH (ref 70–99)
Glucose-Capillary: 259 mg/dL — ABNORMAL HIGH (ref 70–99)
Glucose-Capillary: 121 mg/dL — ABNORMAL HIGH (ref 70–99)
Glucose-Capillary: 102 mg/dL — ABNORMAL HIGH (ref 70–99)
Glucose-Capillary: 172 mg/dL — ABNORMAL HIGH (ref 70–99)

## 2023-11-08 MED ORDER — HYDROMORPHONE HCL 1 MG/ML IJ SOLN
0.5000 mg | Freq: Once | INTRAMUSCULAR | Status: AC
  Administered 2023-11-08: 0.5 mg via INTRAVENOUS

## 2023-11-08 MED ORDER — INSULIN ASPART 100 UNIT/ML IJ SOLN
5.0000 [IU] | Freq: Three times a day (TID) | INTRAMUSCULAR | Status: DC
  Administered 2023-11-09: 5 [IU] via SUBCUTANEOUS

## 2023-11-08 MED ORDER — HYDROMORPHONE HCL 1 MG/ML IJ SOLN
INTRAMUSCULAR | Status: AC
  Filled 2023-11-08: qty 0.5

## 2023-11-08 MED ORDER — INSULIN GLARGINE-YFGN 100 UNIT/ML ~~LOC~~ SOLN
10.0000 [IU] | Freq: Every day | SUBCUTANEOUS | Status: DC
  Administered 2023-11-08 – 2023-11-09 (×2): 10 [IU] via SUBCUTANEOUS
  Filled 2023-11-08 (×2): qty 0.1

## 2023-11-08 MED ORDER — INSULIN ASPART 100 UNIT/ML IJ SOLN
6.0000 [IU] | Freq: Once | INTRAMUSCULAR | Status: AC
  Administered 2023-11-08: 6 [IU] via SUBCUTANEOUS

## 2023-11-08 MED ORDER — INSULIN ASPART 100 UNIT/ML IJ SOLN
4.0000 [IU] | Freq: Once | INTRAMUSCULAR | Status: AC
  Administered 2023-11-08: 4 [IU] via SUBCUTANEOUS

## 2023-11-08 MED ORDER — CHLORHEXIDINE GLUCONATE CLOTH 2 % EX PADS: 6.0000 | MEDICATED_PAD | Freq: Every day | CUTANEOUS | Status: DC

## 2023-11-08 NOTE — Plan of Care (Signed)

## 2023-11-08 NOTE — Progress Notes (Signed)
 Mobility Specialist Progress Note:   11/08/23 1510  Mobility  Level of Assistance Standby assist, set-up cues, supervision of patient - no hands on  Assistive Device Front wheel walker  Distance Ambulated (ft) 250 ft  Activity Response Tolerated well  Mobility Referral Yes  Mobility visit 1 Mobility  Mobility Specialist Start Time (ACUTE ONLY) 1510  Mobility Specialist Stop Time (ACUTE ONLY) 1520  Mobility Specialist Time Calculation (min) (ACUTE ONLY) 10 min   Pt received in bed, agreeable to mobility session. Tolerated well, c/o vaginal pain. Returned pt to room, lying in bed with all needs met.   Clem Wisenbaker Mobility Specialist Please contact via Special educational needs teacher or  Rehab office at [phone]

## 2023-11-08 NOTE — Progress Notes (Signed)
 [facility] Progress Note  Subjective:  Pt seen in room, no c/o's 4.8 L removed w/ HD yest  Vitals:   11/07/23 1935 11/07/23 2342 11/08/23 0412 11/08/23 0825  BP: (!) 137/104 (!) 165/80 102/74 126/74  Pulse: 85   77  Resp: (!) 22 20 17 13   Temp: 98.6 F (37 C) 98.1 F (36.7 C) 98 F (36.7 C) 97.8 F (36.6 C)  TempSrc: Oral Oral Oral Oral  SpO2: 98% 97% 97% 98%  Weight:   69.9 kg   Height:        Exam: Gen: NAD, comfortable, on RA, lying flat Respiratory: Clear bilateral, no rales Cardiovascular: Regular rate rhythm S1-S2 normal, no rubs GI: Abdomen soft, nontender,+distended Extremities: diffuse bilat LE/ hip edema Neurology: Alert, awake, following commands   AV fistula+t/b   Home BP meds: Bumex  2mg  every day Coreg  12.5 bid Losartan  150 mg every day   OP HD: TTS [facility] [address] 3h  60kg   B350  AVG   Heparin  1600 + 600u/hr  Assessment/Plan: Volume overload: due to missed dialysis treatments most likely. Might be a chronic problem. Paracentesis w/ only 1.5 L removed. Standing wt 72.4kg on 7/08. Have reinforced fluid restriction w/ the patient and changed the diet from carb mod ->  renal / carb mod w/ 1.2 L fluid restriction. HD yest w/ 4.8 L off. Still 9kg up. Max UF w/ HD tomorrow.  ESRD: TTS at [facility] in [place].  Misses dialysis frequently. Patient reported that she misses HD due to a transportation problem. HD today. HD tomorrow.  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    Myer Fret MD  [facility] 11/08/2023, 10:39 AM  Recent Labs  Lab 11/05/23 0806 11/07/23 1451  HGB 10.6* 10.0*  ALBUMIN  2.3* 2.2*  CALCIUM  8.5* 8.5*  PHOS 7.0* 6.9*  CREATININE 7.60* 7.14*  K 5.3* 5.2*   No results for input(s): IRON , TIBC, FERRITIN in the last 168 hours. Inpatient medications:  atorvastatin   80 mg Oral Daily   bumetanide   2 mg Oral Daily   calcitRIOL   1.25 mcg Oral Q  T,Th,Sat-1800   carvedilol   12.5 mg Oral BID WC   Chlorhexidine  Gluconate Cloth  6 each Topical Q0600   DULoxetine   20 mg Oral BID   famotidine   20 mg Oral QAC breakfast   insulin  aspart  0-6 Units Subcutaneous TID WC   insulin  aspart  5 Units Subcutaneous TID WC   insulin  glargine-yfgn  10 Units Subcutaneous Daily   lamoTRIgine   200 mg Oral Daily   lipase/protease/amylase  12,000 Units Oral Daily   losartan   150 mg Oral Daily   OLANZapine  zydis  5 mg Oral QHS   sevelamer  carbonate  2,400 mg Oral TID WC   sodium bicarbonate   1,300 mg Oral BID   sodium chloride  flush  3 mL Intravenous Q12H     acetaminophen , albuterol , dextrose , dicyclomine , diphenhydrAMINE , hydrALAZINE , HYDROmorphone  (DILAUDID ) injection, HYDROmorphone , melatonin, metoCLOPramide  (REGLAN ) injection, naLOXone  (NARCAN )  injection, ondansetron  (ZOFRAN ) IV, polyethylene glycol

## 2023-11-08 NOTE — Progress Notes (Signed)
 [facility] night cross cover note:   I was notified by the patient's RN that the patient is complaining of discomfort refractory to existing order for prn p.o. Dilaudid , and requesting a one-time dose of IV Dilaudid  to help further address her pain.  I subsequently ordered Dilaudid  0.5 mg IV x 1 dose now.      Eva Pore, DO Hospitalist

## 2023-11-08 NOTE — Progress Notes (Addendum)
 [facility] night cross cover note:   I was notified by the patient's RN that after a late dinner that was delayed due to returning from hemodialysis, the patient's most recent CBG is 406. Does not have at bedtime SSI coverage.  Subsequently, I ordered NovoLog  6 units subcu x 1 dose now as well as a repeat CBG to be checked in about 1 hour.    Update: updated CBG now 325. I subsequently ordered an additional 4 units of sq Novolog  x 1, with plan for next cbg check to occur via [facility] breakfast check.     Eva Pore, DO Hospitalist

## 2023-11-08 NOTE — Progress Notes (Signed)
 PROGRESS NOTE    Ashley Freeman  FMW:[id number] DOB: 09-Aug-1990 DOA: 11/02/2023 PCP: Keven Crumbly Pap, MD    Brief Narrative:  33 year old with one of the multiple admissions with missed hemodialysis, she has type 1 diabetes on insulin  and recurrent hospitalization. Recently admitted last week with DKA and volume overload, missed 2 dialysis sessions that patient attributed to the transportation not picking her up comes to the emergency room with abdominal distention and leg edema, labial edema In the emergency room was also complaining of vulvar pain that was noted to be due to edema by ER physician, admitted with fluid overload.   Subjective: Patient was seen and examined.  She is sleepy since morning.  Reported slight improvement of the labial swelling but she is very hesitant to walk around. Requesting nephrologist to give her additional dialysis to improve her fluid balance/anasarca.  Assessment & Plan:   Volume overload secondary to missed hemodialysis, anasarca and ascites. Missed dialysis due to transportation issues per patient. Received dialysis 7/6.  7/8, next dialysis today. Patient has significant soft tissue edema, may benefit with additional dialysis to relieve the edema.  Nephrology following.  Anasarca/edema: Paracentesis with 1 L removed.  Fluid management with dialysis..  Asymmetrical valvular swelling/valvular pain: Also with evidence of anasarca and edema.  patient has asymmetrical swelling and is painful with induration. CT scan without evidence of abscess, however with evidence of fluid collections. Seen by OB/GYN.  Recommended fluid management.  No surgical recommendation.  Currently no role for antibiotics.  YYD:Emzdzwuzi with blood glucose of 827.  Initially on insulin  drip.  Improved now.. Noncompliant. Episodic hypoglycemia and misses her food.  Keeping on home dose of insulin  today.  DVT prophylaxis: Place and maintain sequential compression  device Start: 11/03/23 0046   Code Status: Full code Family Communication: None at bedside.  Mother at the bedside 7/10. Disposition Plan: Status is: Inpatient Remains inpatient appropriate because: Significantly fluid overloaded.  Needs subsequent dialysis.     Consultants:  Nephrology OB/GYN  Procedures:  Routine hemodialysis  Antimicrobials:  Vancomycin  and Rocephin  7/9--- 7/10     Objective: Vitals:   11/07/23 2342 11/08/23 0412 11/08/23 0825 11/08/23 1129  BP: (!) 165/80 102/74 126/74 124/72  Pulse:   77 76  Resp: 20 17 13 15   Temp: 98.1 F (36.7 C) 98 F (36.7 C) 97.8 F (36.6 C) (!) 97.2 F (36.2 C)  TempSrc: Oral Oral Oral Axillary  SpO2: 97% 97% 98% 99%  Weight:  69.9 kg    Height:        Intake/Output Summary (Last 24 hours) at 11/08/2023 1141 Last data filed at 11/07/2023 1826 Gross per 24 hour  Intake --  Output 9800 ml  Net -9800 ml   Filed Weights   11/07/23 1434 11/07/23 1826 11/08/23 0412  Weight: 73.9 kg 69.6 kg 69.9 kg    Examination:  General exam: Comfortable and is snoring on approach. Respiratory system: Clear to auscultation. Respiratory effort normal.  No added sounds. Cardiovascular system: S1 & S2 heard, RRR.  Gastrointestinal system: Soft.  Nontender.  Tense and distended.  No rigidity or guarding. Central nervous system: Alert and oriented. No focal neurological deficits. Extremities: Symmetric 5 x 5 power. Tense edema of both legs. Patient interviewed and examined with patient's RN as chaperone. Edematous right vulva.  Slight improvement than yesterday.    Data Reviewed: I have personally reviewed following labs and imaging studies  CBC: Recent Labs  Lab 11/02/23 2140 11/02/23 2205 11/03/23 0539 11/05/23  9193 11/07/23 1451  WBC 10.2  --  12.1* 13.0* 10.5  NEUTROABS 6.4  --   --   --   --   HGB 10.9* 12.2 11.1* 10.6* 10.0*  HCT 36.3 36.0 35.0* 33.4* 32.5*  MCV 89.4  --  87.5 86.8 87.8  PLT 309  --  367 263 260    Basic Metabolic Panel: Recent Labs  Lab 11/02/23 2140 11/02/23 2205 11/03/23 0539 11/05/23 0806 11/07/23 1451  NA 127* 124* 130* 131* 133*  K 5.5* 5.4* 4.4 5.3* 5.2*  CL 90*  --  94* 95* 95*  CO2 19*  --  15* 22 23  GLUCOSE 827*  --  230* 242* 235*  BUN 77*  --  76* 58* 55*  CREATININE 9.28*  --  9.48* 7.60* 7.14*  CALCIUM  8.5*  --  8.9 8.5* 8.5*  MG  --   --  2.5*  --   --   PHOS  --   --  8.8* 7.0* 6.9*   GFR: Estimated Creatinine Clearance: 10.7 mL/min (A) (by C-G formula based on SCr of 7.14 mg/dL (H)). Liver Function Tests: Recent Labs  Lab 11/03/23 0539 11/05/23 0806 11/07/23 1451  AST 49*  --   --   ALT 28  --   --   ALKPHOS 472*  --   --   BILITOT 0.8  --   --   PROT 5.3*  --   --   ALBUMIN  2.6* 2.3* 2.2*   No results for input(s): LIPASE, AMYLASE in the last 168 hours. No results for input(s): AMMONIA in the last 168 hours. Coagulation Profile: No results for input(s): INR, PROTIME in the last 168 hours. Cardiac Enzymes: No results for input(s): CKTOTAL, CKMB, CKMBINDEX, TROPONINI in the last 168 hours. BNP (last 3 results) No results for input(s): PROBNP in the last 8760 hours. HbA1C: No results for input(s): HGBA1C in the last 72 hours. CBG: Recent Labs  Lab 11/07/23 1131 11/07/23 2113 11/08/23 0051 11/08/23 0415 11/08/23 0611  GLUCAP 198* 293* 405* 325* 259*   Lipid Profile: No results for input(s): CHOL, HDL, LDLCALC, TRIG, CHOLHDL, LDLDIRECT in the last 72 hours. Thyroid  Function Tests: No results for input(s): TSH, T4TOTAL, FREET4, T3FREE, THYROIDAB in the last 72 hours. Anemia Panel: No results for input(s): VITAMINB12, FOLATE, FERRITIN, TIBC, IRON , RETICCTPCT in the last 72 hours. Sepsis Labs: No results for input(s): PROCALCITON, LATICACIDVEN in the last 168 hours.  Recent Results (from the past 240 hours)  MRSA Next Gen by PCR, Nasal     Status: None   Collection Time:  11/03/23  5:51 AM   Specimen: Nasal Mucosa; Nasal Swab  Result Value Ref Range Status   MRSA by PCR Next Gen NOT DETECTED NOT DETECTED Final    Comment: (NOTE) The GeneXpert MRSA Assay (FDA approved for NASAL specimens only), is one component of a comprehensive MRSA colonization surveillance program. It is not intended to diagnose MRSA infection nor to guide or monitor treatment for MRSA infections. Test performance is not FDA approved in patients less than 29 years old. Performed at [facility], [address] [place], [place] [address]   Gram stain     Status: None   Collection Time: 11/04/23 11:14 AM   Specimen: Abdomen; Peritoneal Fluid  Result Value Ref Range Status   Specimen Description ABDOMEN  Final   Special Requests NONE  Final   Gram Stain   Final    NO WBC SEEN NO ORGANISMS  SEEN Performed at [facility], [address] [place], [place] [address]    Report Status 11/04/2023 FINAL  Final  Culture, body fluid w Gram Stain-bottle     Status: None (Preliminary result)   Collection Time: 11/04/23 11:15 AM   Specimen: Peritoneal Washings  Result Value Ref Range Status   Specimen Description PERITONEAL  Final   Special Requests NONE  Final   Culture   Final    NO GROWTH 4 DAYS Performed at [facility], [address] [place], [place] [address]    Report Status PENDING  Incomplete         Radiology Studies: CT ABDOMEN PELVIS W CONTRAST Result Date: 11/06/2023 CLINICAL DATA:  Diabetes recent DKA abdominal distension and leg edema fluid overload EXAM: CT ABDOMEN AND PELVIS WITH CONTRAST TECHNIQUE: Multidetector CT imaging of the abdomen and pelvis was performed using the standard protocol following bolus administration of intravenous contrast. RADIATION DOSE REDUCTION: This exam was performed according to the departmental dose-optimization program which includes automated exposure control, adjustment of the mA and/or kV according to patient size  and/or use of iterative reconstruction technique. CONTRAST:  60mL OMNIPAQUE  IOHEXOL  350 MG/ML SOLN COMPARISON:  CT 08/27/2023, 07/19/2023 FINDINGS: Lower chest: Lung bases demonstrate no acute airspace disease. Cardiomegaly. Trace right pleural effusion. Hepatobiliary: Liver is enlarged, craniocaudal measurement of 23 cm. Diffuse heterogenous liver parenchymal density. Cholecystectomy. Stable crescent shaped density the gallbladder fossa, may reflect a small chronic fluid collection or postsurgical change. No biliary dilatation Pancreas: Unremarkable. No pancreatic ductal dilatation or surrounding inflammatory changes. Spleen: Normal in size without focal abnormality. Adrenals/Urinary Tract: Adrenal glands are normal. Kidneys show no hydronephrosis. The bladder is diffusely thick walled but nearly empty Stomach/Bowel: Moderate debris in the stomach. Mild diffuse air distension of the small bowel without transition point. Moderate stool burden. Negative appendix. Vascular/Lymphatic: Nonaneurysmal aorta. Atherosclerosis. Multiple mildly prominent retroperitoneal nodes, less than a cm. Reproductive: Uterus and bilateral adnexa are unremarkable. Other: No free air. Moderate volume of ascites in the abdomen with moderate large pelvic ascites. Considerable subcutaneous edema. Large amount of fluid at the labia. Musculoskeletal: No acute osseous abnormality IMPRESSION: 1. Hepatomegaly with heterogenous appearance of the liver which could be secondary to passive congestion, or potential hepatic inflammation/hepatocellular disease. 2. Moderate volume of ascites in the abdomen with moderate to large pelvic ascites. Considerable subcutaneous edema consistent with anasarca. Large amount of fluid at the labia 3. Mild diffuse air distension of the small bowel without transition point, possible ileus. 4. Cardiomegaly. Trace right pleural effusion. Electronically Signed   By: Luke Bun M.D.   On: 11/06/2023 20:18          Scheduled Meds:  atorvastatin   80 mg Oral Daily   bumetanide   2 mg Oral Daily   calcitRIOL   1.25 mcg Oral Q T,Th,Sat-1800   carvedilol   12.5 mg Oral BID WC   Chlorhexidine  Gluconate Cloth  6 each Topical Q0600   DULoxetine   20 mg Oral BID   famotidine   20 mg Oral QAC breakfast   insulin  aspart  0-6 Units Subcutaneous TID WC   insulin  aspart  5 Units Subcutaneous TID WC   insulin  glargine-yfgn  10 Units Subcutaneous Daily   lamoTRIgine   200 mg Oral Daily   lipase/protease/amylase  12,000 Units Oral Daily   losartan   150 mg Oral Daily   OLANZapine  zydis  5 mg Oral QHS   sevelamer  carbonate  2,400 mg Oral TID WC   sodium bicarbonate   1,300 mg Oral BID   sodium chloride  flush  3 mL Intravenous Q12H   Continuous Infusions:     LOS: 5 days    Time spent: 40 minutes    Renato Applebaum, MD [facility]

## 2023-11-09 ENCOUNTER — Other Ambulatory Visit: Payer: Self-pay | Admitting: Internal Medicine

## 2023-11-09 DIAGNOSIS — E1100 Type 2 diabetes mellitus with hyperosmolarity without nonketotic hyperglycemic-hyperosmolar coma (NKHHC): Secondary | ICD-10-CM | POA: Diagnosis not present

## 2023-11-09 LAB — GLUCOSE, CAPILLARY
Glucose-Capillary: 384 mg/dL — ABNORMAL HIGH (ref 70–99)
Glucose-Capillary: 144 mg/dL — ABNORMAL HIGH (ref 70–99)

## 2023-11-09 LAB — CULTURE, BODY FLUID W GRAM STAIN -BOTTLE: Culture: NO GROWTH

## 2023-11-09 MED ORDER — HYDROMORPHONE HCL 4 MG PO TABS: 4.0000 mg | ORAL_TABLET | Freq: Three times a day (TID) | ORAL | 0 refills | Status: DC | PRN

## 2023-11-09 MED ORDER — LIDOCAINE-PRILOCAINE 2.5-2.5 % EX CREA
1.0000 | TOPICAL_CREAM | CUTANEOUS | Status: DC | PRN
Start: 2023-11-09 — End: 2023-11-09

## 2023-11-09 MED ORDER — HEPARIN SODIUM (PORCINE) 1000 UNIT/ML DIALYSIS: 1000.0000 [IU] | INTRAMUSCULAR | Status: DC | PRN

## 2023-11-09 MED ORDER — NEPRO/CARBSTEADY PO LIQD: 237.0000 mL | ORAL | Status: DC | PRN

## 2023-11-09 MED ORDER — HEPARIN SODIUM (PORCINE) 1000 UNIT/ML DIALYSIS
2000.0000 [IU] | Freq: Once | INTRAMUSCULAR | Status: AC
  Administered 2023-11-09: 2000 [IU] via INTRAVENOUS_CENTRAL

## 2023-11-09 MED ORDER — ANTICOAGULANT SODIUM CITRATE 4% (200MG/5ML) IV SOLN
5.0000 mL | Status: DC | PRN
Start: 2023-11-09 — End: 2023-11-09

## 2023-11-09 MED ORDER — PENTAFLUOROPROP-TETRAFLUOROETH EX AERO
1.0000 | INHALATION_SPRAY | CUTANEOUS | Status: DC | PRN
Start: 2023-11-09 — End: 2023-11-09

## 2023-11-09 MED ORDER — HEPARIN SODIUM (PORCINE) 1000 UNIT/ML IJ SOLN
INTRAMUSCULAR | Status: AC
  Filled 2023-11-09: qty 4

## 2023-11-09 MED ORDER — ALTEPLASE 2 MG IJ SOLR: 2.0000 mg | Freq: Once | INTRAMUSCULAR | Status: DC | PRN

## 2023-11-09 MED ORDER — HEPARIN SODIUM (PORCINE) 1000 UNIT/ML DIALYSIS
1500.0000 [IU] | INTRAMUSCULAR | Status: AC | PRN
  Administered 2023-11-09: 1500 [IU] via INTRAVENOUS_CENTRAL

## 2023-11-09 MED ORDER — HYDROMORPHONE HCL 2 MG PO TABS: 2.0000 mg | ORAL_TABLET | Freq: Four times a day (QID) | ORAL | 0 refills | Status: AC | PRN

## 2023-11-09 MED ORDER — LIDOCAINE HCL (PF) 1 % IJ SOLN: 5.0000 mL | INTRAMUSCULAR | Status: DC | PRN

## 2023-11-09 MED ORDER — HYDROMORPHONE HCL 4 MG PO TABS: 4.0000 mg | ORAL_TABLET | ORAL | 0 refills | Status: DC | PRN

## 2023-11-09 NOTE — Progress Notes (Addendum)
 [facility] Progress Note  Subjective:  Pt seen in room, no c/o's 4.8 L removed w/ HD yest  Vitals:   11/09/23 1030 11/09/23 1043 11/09/23 1048 11/09/23 1053  BP: (!) 147/82 (!) 147/82 (!) 151/82 (!) 152/85  Pulse: 88 85 85 84  Resp: 17 11 13 16   Temp:      TempSrc:      SpO2: 99% 97% 96% 100%  Weight:      Height:        Exam: Gen: NAD, comfortable, on RA, lying flat Respiratory: Clear bilateral, no rales Cardiovascular: Regular rate rhythm S1-S2 normal, no rubs GI: Abdomen soft, nontender,+distended Extremities: diffuse bilat LE/ hip edema Neurology: Alert, awake, following commands   AV fistula+t/b   Home BP meds: Bumex  2mg  every day Coreg  12.5 bid Losartan  150 mg every day   OP HD: TTS [facility] [address] 3h  60kg   B350  AVG   Heparin  1600 + 600u/hr  Assessment/Plan: Volume overload: due to missed dialysis treatments and/or fluid indiscretion.  Had paracentesis w/ 1.5 L removed. Had HD x 4 here w/ about 18L removed in total. Have reinforced fluid restriction w/ the patient. Good UF w/ HD this am. Pt to be dc'd per primary team.  ESRD: TTS at [facility] in [place]. HD done this am.  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  Dispo: per pmd, for dc today   Myer Fret MD  [facility] 11/09/2023, 11:06 AM  Recent Labs  Lab 11/05/23 0806 11/07/23 1451  HGB 10.6* 10.0*  ALBUMIN  2.3* 2.2*  CALCIUM  8.5* 8.5*  PHOS 7.0* 6.9*  CREATININE 7.60* 7.14*  K 5.3* 5.2*   No results for input(s): IRON , TIBC, FERRITIN in the last 168 hours. Inpatient medications:  atorvastatin   80 mg Oral Daily   bumetanide   2 mg Oral Daily   calcitRIOL   1.25 mcg Oral Q T,Th,Sat-1800   carvedilol   12.5 mg Oral BID WC   Chlorhexidine  Gluconate Cloth  6 each Topical Q0600   DULoxetine   20 mg Oral BID   famotidine   20 mg Oral QAC breakfast   insulin  aspart  0-6 Units Subcutaneous TID WC   insulin   aspart  5 Units Subcutaneous TID WC   insulin  glargine-yfgn  10 Units Subcutaneous Daily   lamoTRIgine   200 mg Oral Daily   lipase/protease/amylase  12,000 Units Oral Daily   losartan   150 mg Oral Daily   OLANZapine  zydis  5 mg Oral QHS   sevelamer  carbonate  2,400 mg Oral TID WC   sodium bicarbonate   1,300 mg Oral BID   sodium chloride  flush  3 mL Intravenous Q12H    anticoagulant sodium citrate       acetaminophen , albuterol , alteplase , anticoagulant sodium citrate , dextrose , dicyclomine , diphenhydrAMINE , feeding supplement (NEPRO CARB STEADY), heparin , hydrALAZINE , HYDROmorphone , lidocaine  (PF), lidocaine -prilocaine , melatonin, metoCLOPramide  (REGLAN ) injection, naLOXone  (NARCAN )  injection, ondansetron  (ZOFRAN ) IV, pentafluoroprop-tetrafluoroeth, polyethylene glycol

## 2023-11-09 NOTE — Discharge Summary (Signed)
 Physician Discharge Summary  Ashley Freeman FMW:[id number] DOB: 05-06-1990 DOA: 11/02/2023  PCP: Keven Crumbly Pap, MD  Admit date: 11/02/2023 Discharge date: 11/09/2023  Admitted From: Home Disposition: Home  Recommendations for Outpatient Follow-up:  Do not miss any dialysis sessions. Continue to use your insulin  as advised.  Home Health: N/A Equipment/Devices: Rolling walker  Discharge Condition: Stable CODE STATUS: Full code Diet recommendation: Low-salt and low-carb diet.  Fluid restriction 1200 mL in 24 hours.  Discharge summary: 33 year old with one of the multiple admissions with missed hemodialysis, she has type 1 diabetes on insulin  and recurrent hospitalization. Recently admitted last week with DKA and volume overload, missed 2 dialysis sessions that patient attributed to the transportation not picking her up comes to the emergency room with abdominal distention and leg edema, labial edema   Volume overload, anasarca, valvular swelling and pain: Patient presented with tense ascites, tense edema, edematous right labial fold that was very tense and tender. CT scan with evidence of anasarca, no abscess.  Also seen by OB/GYN. Patient was treated symptomatically.  She received multiple sessions of hemodialysis with improvement of fluid status and also improvement of the labial swelling. Paracentesis with 1 L removed. Overall improved.  Going home today.  Next dialysis Tuesday.  She is now scheduled to have home evaluation for dialysis and hopefully this will help her to stay compliant. Still has some pain, was prescribed a short course of Dilaudid  by mouth.   YYD:Emzdzwuzi with blood glucose of 827.  Initially on insulin  drip.  Improved now.. Noncompliant. Episodic hypoglycemia and misses her food.  Extensive counseling done.  She will go back on her home doses of insulin  as she has been doing.  Essential hypertension: Blood pressure fairly stable.  Resume all home  medications.  Stable to discharge home due to much clinical improvement.     Discharge Diagnoses:  Principal Problem:   Hyperosmolar hyperglycemic state (HHS) (HCC) Active Problems:   ESRD on hemodialysis (HCC)   Type 1 diabetes mellitus (HCC)   HFrEF (heart failure with reduced ejection fraction) (HCC)   Volume overload   Vulvar pain    Discharge Instructions  Discharge Instructions     Diet - low sodium heart healthy   Complete by: As directed    Diet Carb Modified   Complete by: As directed    1200 ml total fluid intake in 24 hours   Increase activity slowly   Complete by: As directed       Allergies as of 11/09/2023       Reactions   Cephalexin  Anaphylaxis, Other (See Comments)   Has gotten ceftriaxone  in the past    Morphine  Itching   Penicillins Hives, Rash   Fish Allergy Other (See Comments)   Allergic   Benadryl  [diphenhydramine ] Itching   Dilaudid  [hydromorphone ] Itching   Doxycycline  Itching   Methotrexate Derivatives Rash   Oxycodone  Itching        Medication List     STOP taking these medications    prochlorperazine  5 MG tablet Commonly known as: COMPAZINE        TAKE these medications    albuterol  108 (90 Base) MCG/ACT inhaler Commonly known as: VENTOLIN  HFA Inhale 2 puffs into the lungs every 4 (four) hours as needed for wheezing or shortness of breath.   atorvastatin  80 MG tablet Commonly known as: LIPITOR  Take 1 tablet (80 mg total) by mouth daily.   bumetanide  2 MG tablet Commonly known as: BUMEX  Take 2 mg by mouth daily.  calcitRIOL  0.25 MCG capsule Commonly known as: ROCALTROL  Take 5 capsules (1.25 mcg total) by mouth every Tuesday, Thursday, and Saturday at 6 PM.   carvedilol  12.5 MG tablet Commonly known as: COREG  Take 1 tablet (12.5 mg total) by mouth 2 (two) times daily with a meal.   cyclobenzaprine  5 MG tablet Commonly known as: FLEXERIL  Take 1 tablet (5 mg total) by mouth 3 (three) times daily as needed  for muscle spasms.   Dexcom G7 Sensor Misc Change sensors every 10 days What changed:  how much to take how to take this when to take this additional instructions   dicyclomine  20 MG tablet Commonly known as: BENTYL  Take 20 mg by mouth 2 (two) times daily as needed for spasms.   DULoxetine  20 MG capsule Commonly known as: CYMBALTA  Take 20 mg by mouth 2 (two) times daily.   famotidine  20 MG tablet Commonly known as: PEPCID  Take 20 mg by mouth daily before breakfast.   HYDROmorphone  4 MG tablet Commonly known as: DILAUDID  Take 1 tablet (4 mg total) by mouth every 4 (four) hours as needed for severe pain (pain score 7-10) or moderate pain (pain score 4-6).   insulin  aspart 100 UNIT/ML injection Commonly known as: novoLOG  Inject 5 Units into the skin 3 (three) times daily with meals.   lamoTRIgine  200 MG tablet Commonly known as: LAMICTAL  Take 1 tablet (200 mg total) by mouth daily.   Lantus  SoloStar 100 UNIT/ML Solostar Pen Generic drug: insulin  glargine Inject 10 Units into the skin in the morning.   losartan  100 MG tablet Commonly known as: COZAAR  Take 150 mg by mouth daily.   metoCLOPramide  10 MG tablet Commonly known as: REGLAN  Take 1 tablet (10 mg total) by mouth every 8 (eight) hours as needed for nausea.   OLANZapine  zydis 5 MG disintegrating tablet Commonly known as: ZYPREXA  Take 5 mg by mouth at bedtime.   ondansetron  4 MG disintegrating tablet Commonly known as: ZOFRAN -ODT Take 1 tablet (4 mg total) by mouth every 8 (eight) hours as needed for nausea or vomiting.   Pancrelipase  (Lip-Prot-Amyl) 3000-9500 units Cpep Take 3,000 units of lipase by mouth 3 (three) times daily.   sevelamer  carbonate 800 MG tablet Commonly known as: RENVELA  Take 2,400 mg by mouth 3 (three) times daily with meals.   sodium bicarbonate  650 MG tablet Take 1,300 mg by mouth 2 (two) times daily.   SUMAtriptan  50 MG tablet Commonly known as: IMITREX  Take 50 mg by mouth  every 2 (two) hours as needed for migraine or headache.               Durable Medical Equipment  (From admission, onward)           Start     Ordered   11/05/23 1515  For home use only DME Walker rolling  Once       Question Answer Comment  Walker: With 5 Inch Wheels   Patient needs a walker to treat with the following condition ESRD (end stage renal disease) (HCC)      11/05/23 1515            Allergies  Allergen Reactions   Cephalexin  Anaphylaxis and Other (See Comments)    Has gotten ceftriaxone  in the past    Morphine  Itching   Penicillins Hives and Rash   Fish Allergy Other (See Comments)    Allergic   Benadryl  [Diphenhydramine ] Itching   Dilaudid  [Hydromorphone ] Itching   Doxycycline  Itching   Methotrexate Derivatives Rash  Oxycodone  Itching    Consultations: Nephrology OB/GYN   Procedures/Studies: CT ABDOMEN PELVIS W CONTRAST Result Date: 11/06/2023 CLINICAL DATA:  Diabetes recent DKA abdominal distension and leg edema fluid overload EXAM: CT ABDOMEN AND PELVIS WITH CONTRAST TECHNIQUE: Multidetector CT imaging of the abdomen and pelvis was performed using the standard protocol following bolus administration of intravenous contrast. RADIATION DOSE REDUCTION: This exam was performed according to the departmental dose-optimization program which includes automated exposure control, adjustment of the mA and/or kV according to patient size and/or use of iterative reconstruction technique. CONTRAST:  60mL OMNIPAQUE  IOHEXOL  350 MG/ML SOLN COMPARISON:  CT 08/27/2023, 07/19/2023 FINDINGS: Lower chest: Lung bases demonstrate no acute airspace disease. Cardiomegaly. Trace right pleural effusion. Hepatobiliary: Liver is enlarged, craniocaudal measurement of 23 cm. Diffuse heterogenous liver parenchymal density. Cholecystectomy. Stable crescent shaped density the gallbladder fossa, may reflect a small chronic fluid collection or postsurgical change. No biliary  dilatation Pancreas: Unremarkable. No pancreatic ductal dilatation or surrounding inflammatory changes. Spleen: Normal in size without focal abnormality. Adrenals/Urinary Tract: Adrenal glands are normal. Kidneys show no hydronephrosis. The bladder is diffusely thick walled but nearly empty Stomach/Bowel: Moderate debris in the stomach. Mild diffuse air distension of the small bowel without transition point. Moderate stool burden. Negative appendix. Vascular/Lymphatic: Nonaneurysmal aorta. Atherosclerosis. Multiple mildly prominent retroperitoneal nodes, less than a cm. Reproductive: Uterus and bilateral adnexa are unremarkable. Other: No free air. Moderate volume of ascites in the abdomen with moderate large pelvic ascites. Considerable subcutaneous edema. Large amount of fluid at the labia. Musculoskeletal: No acute osseous abnormality IMPRESSION: 1. Hepatomegaly with heterogenous appearance of the liver which could be secondary to passive congestion, or potential hepatic inflammation/hepatocellular disease. 2. Moderate volume of ascites in the abdomen with moderate to large pelvic ascites. Considerable subcutaneous edema consistent with anasarca. Large amount of fluid at the labia 3. Mild diffuse air distension of the small bowel without transition point, possible ileus. 4. Cardiomegaly. Trace right pleural effusion. Electronically Signed   By: Luke Bun M.D.   On: 11/06/2023 20:18   IR Paracentesis Result Date: 11/04/2023 INDICATION: End-stage renal disease with recurrent ascites. Request for diagnostic and therapeutic paracentesis. EXAM: ULTRASOUND GUIDED PARACENTESIS MEDICATIONS: 1% lidocaine  10 mL COMPLICATIONS: None immediate. PROCEDURE: Informed written consent was obtained from the patient after a discussion of the risks, benefits and alternatives to treatment. A timeout was performed prior to the initiation of the procedure. Initial ultrasound scanning demonstrates a moderate amount of ascites  within the left lateral abdomen. The left lateral abdomen was prepped and draped in the usual sterile fashion. 1% lidocaine  was used for local anesthesia. Following this, a 19 gauge, 7-cm, Yueh catheter was introduced. An ultrasound image was saved for documentation purposes. The paracentesis was performed. The catheter was removed and a dressing was applied. The patient tolerated the procedure well without immediate post procedural complication. FINDINGS: A total of approximately 1.5 L of clear yellow fluid was removed. Samples were sent to the laboratory as requested by the clinical team. IMPRESSION: Successful ultrasound-guided paracentesis yielding 1.5 liters of peritoneal fluid. Procedure performed by: Sari Lamp, PA-C Electronically Signed   By: Juliene Balder M.D.   On: 11/04/2023 15:01   US  ASCITES (ABDOMEN LIMITED) Result Date: 11/03/2023 CLINICAL DATA:  33 year old female with ascites. End stage renal disease on dialysis. 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 EXAM: LIMITED ABDOMEN ULTRASOUND FOR ASCITES TECHNIQUE: Limited ultrasound survey for ascites was performed in all four abdominal quadrants. COMPARISON:  CT Abdomen and Pelvis  08/27/2023 and earlier. FINDINGS: Grayscale images of all 4 quadrants demonstrate a moderate volume of ascites bilaterally. Appearance is very similar to the ultrasound on 10/13/2023 which proceeded ultrasound-guided paracentesis of 2.7 liters of fluid that day. IMPRESSION: Moderate volume recurrent Ascites, similar to 10/13/2023 pre-paracentesis ultrasound. Electronically Signed   By: VEAR Hurst M.D.   On: 11/03/2023 06:18   DG Chest 2 View Result Date: 11/02/2023 CLINICAL DATA:  Shortness of breath EXAM: CHEST - 2 VIEW COMPARISON:  10/31/2023 FINDINGS: Shallow inspiration. Cardiac enlargement. No vascular congestion, edema, or consolidation. No pleural effusion or pneumothorax. Mediastinal contours appear intact. No significant changes.  IMPRESSION: Cardiac enlargement.  No evidence of active pulmonary disease. Electronically Signed   By: Elsie Gravely M.D.   On: 11/02/2023 23:35   CT Head Wo Contrast Result Date: 10/31/2023 CLINICAL DATA:  Clemens, hit head EXAM: CT HEAD WITHOUT CONTRAST TECHNIQUE: Contiguous axial images were obtained from the base of the skull through the vertex without intravenous contrast. RADIATION DOSE REDUCTION: This exam was performed according to the departmental dose-optimization program which includes automated exposure control, adjustment of the mA and/or kV according to patient size and/or use of iterative reconstruction technique. COMPARISON:  CT head 09/07/2022 FINDINGS: Brain: No intracranial hemorrhage, mass effect, or evidence of acute infarct. No hydrocephalus. No extra-axial fluid collection. Vascular: No hyperdense vessel or unexpected calcification. Skull: No fracture or focal lesion. Sinuses/Orbits: No acute finding. Other: None. IMPRESSION: No acute intracranial abnormality. Electronically Signed   By: Norman Gatlin M.D.   On: 10/31/2023 21:24   DG Knee Complete 4 Views Left Result Date: 10/31/2023 CLINICAL DATA:  Status post fall. EXAM: LEFT KNEE - COMPLETE 4+ VIEW COMPARISON:  None Available. FINDINGS: No evidence of fracture, dislocation, or joint effusion. No evidence of arthropathy or other focal bone abnormality. Diffuse soft tissue swelling is seen which is likely, in part, related to the patient's body habitus. IMPRESSION: No acute osseous abnormality. Electronically Signed   By: Suzen Dials M.D.   On: 10/31/2023 20:40   DG Chest 1 View Result Date: 10/31/2023 CLINICAL DATA:  Chest pain and shortness of breath. EXAM: CHEST  1 VIEW COMPARISON:  October 25, 2023 FINDINGS: The cardiac silhouette is enlarged and unchanged in size. Low lung volumes are noted. Both lungs are clear. The visualized skeletal structures are unremarkable. IMPRESSION: No active cardiopulmonary disease. Electronically  Signed   By: Suzen Dials M.D.   On: 10/31/2023 20:37   IR ABDOMEN US  LIMITED Result Date: 10/28/2023 CLINICAL DATA:  33 year old female with a history of type 1 diabetes, end-stage renal disease on dialysis, hemorrhage current ascites. Request for therapeutic paracentesis. EXAM: ULTRASOUND ABDOMEN LIMITED COMPARISON:  None FINDINGS: Imaging of all 4 quadrants of the abdomen reveals a small amount of ascites. IMPRESSION: Small amount of ascites seen on ultrasound. After discussion of the risks versus benefits of the procedure the patient decided to defer paracentesis at this time. Performed by: Sherrilee Bal, PA-C under the supervision of Dr. KANDICE Moan Electronically Signed   By: Marcey Moan M.D.   On: 10/28/2023 11:35   DG Chest 2 View Result Date: 10/25/2023 CLINICAL DATA:  Chest pain. EXAM: CHEST - 2 VIEW COMPARISON:  10/23/2023. FINDINGS: Low lung volume. Complete clearing of previously noted pulmonary vascular congestion. Bilateral lung fields are essentially clear converting probable minimal scarring/atelectasis overlying the left lower lung zone, laterally. No acute consolidation or lung collapse. Bilateral costophrenic angles are clear. Stable mildly enlarged cardio-mediastinal silhouette, which is likely accentuated by  low lung volume and AP technique. No acute osseous abnormalities. The soft tissues are within normal limits. IMPRESSION: No active cardiopulmonary disease. Electronically Signed   By: Ree Molt M.D.   On: 10/25/2023 08:33   DG Chest 2 View Result Date: 10/23/2023 CLINICAL DATA:  Dyspnea, volume overload EXAM: CHEST - 2 VIEW COMPARISON:  10/18/2023 FINDINGS: Lung volumes are small, but are symmetric and are stable since prior examination. Progressive mild perihilar interstitial pulmonary infiltrate has developed in keeping with mild progressive interstitial pulmonary edema. No pneumothorax or pleural effusion. Cardiac size is mildly enlarged. No acute bone.  IMPRESSION: 1. Pulmonary hypoinflation. 2. Mild progressive interstitial pulmonary edema, possibly cardiogenic. Electronically Signed   By: Dorethia Molt M.D.   On: 10/23/2023 02:41   DG Chest Port 1 View Result Date: 10/20/2023 CLINICAL DATA:  Altered mental status EXAM: PORTABLE CHEST 1 VIEW COMPARISON:  Chest x-ray 10/19/2023 FINDINGS: The heart is enlarged, unchanged. The lungs are clear. There is no pleural effusion or pneumothorax. No acute fractures are seen a IMPRESSION: No active disease. Electronically Signed   By: Greig Pique M.D.   On: 10/20/2023 23:32   DG Chest Port 1 View Result Date: 10/19/2023 CLINICAL DATA:  Shortness of breath EXAM: PORTABLE CHEST 1 VIEW COMPARISON:  Chest x-ray performed October 12, 2023 FINDINGS: Low lung volumes. Enlarged heart with interstitial airspace opacities. Suspected small pleural effusions. IMPRESSION: 1. Enlarged heart with low lung volumes. 2. Interstitial edema with suspected small pleural effusions. Electronically Signed   By: Maude Naegeli M.D.   On: 10/19/2023 06:49   US  Paracentesis Result Date: 10/14/2023 INDICATION: Ascites EXAM: ULTRASOUND GUIDED  PARACENTESIS MEDICATIONS: None. COMPLICATIONS: None immediate. PROCEDURE: Informed written consent was obtained from the patient after a discussion of the risks, benefits and alternatives to treatment. A timeout was performed prior to the initiation of the procedure. Initial ultrasound scanning demonstrates a large amount of ascites within the right lower abdominal quadrant. The right lower abdomen was prepped and draped in the usual sterile fashion. 1% lidocaine  was used for local anesthesia. Following this, a 6 Fr Safe-T-Centesis catheter was introduced. An ultrasound image was saved for documentation purposes. The paracentesis was performed. The catheter was removed and a dressing was applied. The patient tolerated the procedure well without immediate post procedural complication. Patient received  post-procedure intravenous albumin ; see nursing notes for details. FINDINGS: A total of approximately 2700 ML of YELLOW fluid was removed. Samples were sent to the laboratory IF requested by the clinical team. IMPRESSION: Successful ultrasound-guided paracentesis yielding 2.7 liters of peritoneal fluid. PLAN: If the patient eventually requires >/=2 paracenteses in a 30 day period, candidacy for formal evaluation by the [facility] will be assessed. Electronically Signed   By: Wilkie Lent M.D.   On: 10/14/2023 06:45   US  ASCITES (ABDOMEN LIMITED) Result Date: 10/13/2023 CLINICAL DATA:  Abdominal distension.  Assess for ascites. EXAM: LIMITED ABDOMEN ULTRASOUND FOR ASCITES TECHNIQUE: Limited ultrasound survey for ascites was performed in all four abdominal quadrants. COMPARISON:  None Available. FINDINGS: Evaluation of the 4 quadrants of the abdomen demonstrates moderate volume ascites. IMPRESSION: Positive for moderate ascites. Electronically Signed   By: Wilkie Lent M.D.   On: 10/13/2023 11:42   DG Abd Portable 1V Result Date: 10/13/2023 CLINICAL DATA:  Abdominal distension. EXAM: PORTABLE ABDOMEN - 1 VIEW COMPARISON:  1225. FINDINGS: The bowel gas pattern is normal. No radio-opaque calculi or other significant radiographic abnormality are seen. IMPRESSION: Negative. Electronically Signed  By: Fonda Field M.D.   On: 10/13/2023 11:02   DG Chest Portable 1 View Result Date: 10/12/2023 EXAM: 1 VIEW XRAY OF THE CHEST 10/12/2023 04:26:00 AM COMPARISON: 2-view chest x-ray 10/05/2023 CLINICAL HISTORY: Chest pain. Shortness of breath. Dialysis patient. FINDINGS: LUNGS AND PLEURA: No focal pulmonary opacity. No pulmonary edema. No pleural effusion. No pneumothorax. HEART AND MEDIASTINUM: Cardiomegaly is stable. BONES AND SOFT TISSUES: No acute osseous abnormality. IMPRESSION: 1. No acute findings. 2. Stable cardiomegaly. Electronically signed by:  Lonni Necessary MD 10/12/2023 04:31 AM EDT RP Workstation: [id number]   (Echo, Carotid, EGD, Colonoscopy, ERCP)    Subjective: Patient seen and examined.  Came back from dialysis.  Today she is more communicating interactive.  She feels much better.  She has been able to walk around. Leg edema has much improved.  Labial swelling has much improved but still present.  Wants to go home.   Discharge Exam: Vitals:   11/09/23 1053 11/09/23 1124  BP: (!) 152/85 (!) 172/97  Pulse: 84 90  Resp: 16 16  Temp:  98.4 F (36.9 C)  SpO2: 100% 96%   Vitals:   11/09/23 1043 11/09/23 1048 11/09/23 1053 11/09/23 1124  BP: (!) 147/82 (!) 151/82 (!) 152/85 (!) 172/97  Pulse: 85 85 84 90  Resp: 11 13 16 16   Temp:    98.4 F (36.9 C)  TempSrc:    Oral  SpO2: 97% 96% 100% 96%  Weight:      Height:        General: Pt is alert, awake, not in acute distress Cushingoid face.  She is on room air.  Alert awake and interactive.  Pleasant to conversation. Cardiovascular: RRR, S1/S2 +, no rubs, no gallops Respiratory: CTA bilaterally, no wheezing, no rhonchi Abdominal: Soft, NT, mildly distended but nontender., bowel sounds + Extremities: 1+ pedal edema. Patient was examined with female RN as chaperone at the bedside. Patient has asymmetrical swelling of the right labial fold with some pitting edema, less tense and nontender than previous exams.    The results of significant diagnostics from this hospitalization (including imaging, microbiology, ancillary and laboratory) are listed below for reference.     Microbiology: Recent Results (from the past 240 hours)  MRSA Next Gen by PCR, Nasal     Status: None   Collection Time: 11/03/23  5:51 AM   Specimen: Nasal Mucosa; Nasal Swab  Result Value Ref Range Status   MRSA by PCR Next Gen NOT DETECTED NOT DETECTED Final    Comment: (NOTE) The GeneXpert MRSA Assay (FDA approved for NASAL specimens only), is one component of a comprehensive MRSA  colonization surveillance program. It is not intended to diagnose MRSA infection nor to guide or monitor treatment for MRSA infections. Test performance is not FDA approved in patients less than 67 years old. Performed at [facility], [address] [place], [place] [address]   Gram stain     Status: None   Collection Time: 11/04/23 11:14 AM   Specimen: Abdomen; Peritoneal Fluid  Result Value Ref Range Status   Specimen Description ABDOMEN  Final   Special Requests NONE  Final   Gram Stain   Final    NO WBC SEEN NO ORGANISMS SEEN Performed at [facility], [address] [place], [place] [address]    Report Status 11/04/2023 FINAL  Final  Culture, body fluid w Gram Stain-bottle     Status: None   Collection Time: 11/04/23 11:15 AM  Specimen: Peritoneal Washings  Result Value Ref Range Status   Specimen Description PERITONEAL  Final   Special Requests NONE  Final   Culture   Final    NO GROWTH 5 DAYS Performed at [facility], [address], [place], [place] [address]    Report Status 11/09/2023 FINAL  Final     Labs: BNP (last 3 results) Recent Labs    10/12/23 0500 10/23/23 0310 10/25/23 0830  BNP >4,500.0* >4,500.0* >4,500.0*   Basic Metabolic Panel: Recent Labs  Lab 11/02/23 2140 11/02/23 2205 11/03/23 0539 11/05/23 0806 11/07/23 1451  NA 127* 124* 130* 131* 133*  K 5.5* 5.4* 4.4 5.3* 5.2*  CL 90*  --  94* 95* 95*  CO2 19*  --  15* 22 23  GLUCOSE 827*  --  230* 242* 235*  BUN 77*  --  76* 58* 55*  CREATININE 9.28*  --  9.48* 7.60* 7.14*  CALCIUM  8.5*  --  8.9 8.5* 8.5*  MG  --   --  2.5*  --   --   PHOS  --   --  8.8* 7.0* 6.9*   Liver Function Tests: Recent Labs  Lab 11/03/23 0539 11/05/23 0806 11/07/23 1451  AST 49*  --   --   ALT 28  --   --   ALKPHOS 472*  --   --   BILITOT 0.8  --   --   PROT 5.3*  --   --   ALBUMIN  2.6* 2.3* 2.2*   No results for input(s): LIPASE, AMYLASE in the last 168 hours. No results  for input(s): AMMONIA in the last 168 hours. CBC: Recent Labs  Lab 11/02/23 2140 11/02/23 2205 11/03/23 0539 11/05/23 0806 11/07/23 1451  WBC 10.2  --  12.1* 13.0* 10.5  NEUTROABS 6.4  --   --   --   --   HGB 10.9* 12.2 11.1* 10.6* 10.0*  HCT 36.3 36.0 35.0* 33.4* 32.5*  MCV 89.4  --  87.5 86.8 87.8  PLT 309  --  367 263 260   Cardiac Enzymes: No results for input(s): CKTOTAL, CKMB, CKMBINDEX, TROPONINI in the last 168 hours. BNP: Invalid input(s): POCBNP CBG: Recent Labs  Lab 11/08/23 1205 11/08/23 1545 11/08/23 2007 11/09/23 0614 11/09/23 1143  GLUCAP 121* 102* 172* 384* 144*   D-Dimer No results for input(s): DDIMER in the last 72 hours. Hgb A1c No results for input(s): HGBA1C in the last 72 hours. Lipid Profile No results for input(s): CHOL, HDL, LDLCALC, TRIG, CHOLHDL, LDLDIRECT in the last 72 hours. Thyroid  function studies No results for input(s): TSH, T4TOTAL, T3FREE, THYROIDAB in the last 72 hours.  Invalid input(s): FREET3 Anemia work up No results for input(s): VITAMINB12, FOLATE, FERRITIN, TIBC, IRON , RETICCTPCT in the last 72 hours. Urinalysis    Component Value Date/Time   COLORURINE YELLOW 10/25/2023 2300   APPEARANCEUR CLOUDY (A) 10/25/2023 2300   APPEARANCEUR Hazy 11/10/2013 2043   LABSPEC 1.016 10/25/2023 2300   LABSPEC 1.031 11/10/2013 2043   PHURINE 6.0 10/25/2023 2300   GLUCOSEU >=500 (A) 10/25/2023 2300   GLUCOSEU >=500 11/10/2013 2043   HGBUR SMALL (A) 10/25/2023 2300   BILIRUBINUR NEGATIVE 10/25/2023 2300   BILIRUBINUR negative 06/04/2018 1035   BILIRUBINUR Negative 11/24/2015 1443   BILIRUBINUR Negative 11/10/2013 2043   KETONESUR 5 (A) 10/25/2023 2300   PROTEINUR >=300 (A) 10/25/2023 2300   UROBILINOGEN 0.2 09/14/2019 1732   NITRITE NEGATIVE 10/25/2023 2300   LEUKOCYTESUR NEGATIVE 10/25/2023 2300  LEUKOCYTESUR 1+ 11/10/2013 2043   Sepsis Labs Recent Labs  Lab 11/02/23 2140  11/03/23 0539 11/05/23 0806 11/07/23 1451  WBC 10.2 12.1* 13.0* 10.5   Microbiology Recent Results (from the past 240 hours)  MRSA Next Gen by PCR, Nasal     Status: None   Collection Time: 11/03/23  5:51 AM   Specimen: Nasal Mucosa; Nasal Swab  Result Value Ref Range Status   MRSA by PCR Next Gen NOT DETECTED NOT DETECTED Final    Comment: (NOTE) The GeneXpert MRSA Assay (FDA approved for NASAL specimens only), is one component of a comprehensive MRSA colonization surveillance program. It is not intended to diagnose MRSA infection nor to guide or monitor treatment for MRSA infections. Test performance is not FDA approved in patients less than 16 years old. Performed at [facility], [address] [place], [place] [address]   Gram stain     Status: None   Collection Time: 11/04/23 11:14 AM   Specimen: Abdomen; Peritoneal Fluid  Result Value Ref Range Status   Specimen Description ABDOMEN  Final   Special Requests NONE  Final   Gram Stain   Final    NO WBC SEEN NO ORGANISMS SEEN Performed at [facility], [address], [place], [place] [address]    Report Status 11/04/2023 FINAL  Final  Culture, body fluid w Gram Stain-bottle     Status: None   Collection Time: 11/04/23 11:15 AM   Specimen: Peritoneal Washings  Result Value Ref Range Status   Specimen Description PERITONEAL  Final   Special Requests NONE  Final   Culture   Final    NO GROWTH 5 DAYS Performed at [facility], [address], [place], [place] [address]    Report Status 11/09/2023 FINAL  Final     Time coordinating discharge: 40 minutes  SIGNED:   Renato Applebaum, MD  [facility] 11/09/2023, 11:51 AM

## 2023-11-09 NOTE — Progress Notes (Signed)
 AVS gone over with patient, iv removed, patient belongings gathered and patient was wheeled out to family vehicle.

## 2023-11-09 NOTE — Progress Notes (Signed)
   11/09/23 1053  Vitals  Temp 98.7 F (37.1 C)  Pulse Rate 84  Resp 16  BP (!) 152/85  SpO2 100 %  O2 Device Room Air  Weight 64.3 kg  Type of Weight Post-Dialysis  Oxygen Therapy  Patient Activity (if Appropriate) In bed  Pulse Oximetry Type Continuous  Post Treatment  Dialyzer Clearance Lightly streaked  Hemodialysis Intake (mL) 0 mL  Liters Processed 63  Fluid Removed (mL) 5000 mL  Tolerated HD Treatment Yes  Post-Hemodialysis Comments HD tolerated  AVG/AVF Arterial Site Held (minutes) 10 minutes  AVG/AVF Venous Site Held (minutes) 10 minutes   Received patient in bed to unit.  Alert and oriented.  Informed consent signed and in chart.    TX duration:3hrs   Patient tolerated well.  Transported back to the room  Alert, without acute distress.  Hand-off given to patient's nurse.    Access used: AVG Access issues: none   Total UF removed: 5L Medication(s) given: see eMAR   Woodfin Dolores RN [facility] D[facility] Unit

## 2023-11-11 ENCOUNTER — Inpatient Hospital Stay (HOSPITAL_COMMUNITY)
Admission: EM | Admit: 2023-11-11 | Discharge: 2023-11-12 | DRG: 638 | Discharge status: Against Medical Advice | Attending: Internal Medicine | Admitting: Internal Medicine

## 2023-11-11 ENCOUNTER — Encounter (HOSPITAL_COMMUNITY): Payer: Self-pay | Admitting: Internal Medicine

## 2023-11-11 ENCOUNTER — Emergency Department (HOSPITAL_COMMUNITY)

## 2023-11-11 DIAGNOSIS — I132 Hypertensive heart and chronic kidney disease with heart failure and with stage 5 chronic kidney disease, or end stage renal disease: Secondary | ICD-10-CM | POA: Diagnosis present

## 2023-11-11 DIAGNOSIS — Z5329 Procedure and treatment not carried out because of patient's decision for other reasons: Secondary | ICD-10-CM | POA: Diagnosis not present

## 2023-11-11 DIAGNOSIS — Z794 Long term (current) use of insulin: Secondary | ICD-10-CM

## 2023-11-11 DIAGNOSIS — Y92003 Bedroom of unspecified non-institutional (private) residence as the place of occurrence of the external cause: Secondary | ICD-10-CM | POA: Diagnosis not present

## 2023-11-11 DIAGNOSIS — W06XXXA Fall from bed, initial encounter: Secondary | ICD-10-CM | POA: Diagnosis present

## 2023-11-11 DIAGNOSIS — E1065 Type 1 diabetes mellitus with hyperglycemia: Secondary | ICD-10-CM | POA: Diagnosis present

## 2023-11-11 DIAGNOSIS — N2581 Secondary hyperparathyroidism of renal origin: Secondary | ICD-10-CM | POA: Diagnosis present

## 2023-11-11 DIAGNOSIS — I420 Dilated cardiomyopathy: Secondary | ICD-10-CM | POA: Diagnosis present

## 2023-11-11 DIAGNOSIS — E785 Hyperlipidemia, unspecified: Secondary | ICD-10-CM | POA: Diagnosis present

## 2023-11-11 DIAGNOSIS — E10649 Type 1 diabetes mellitus with hypoglycemia without coma: Principal | ICD-10-CM | POA: Diagnosis present

## 2023-11-11 DIAGNOSIS — E873 Alkalosis: Secondary | ICD-10-CM | POA: Diagnosis present

## 2023-11-11 DIAGNOSIS — I959 Hypotension, unspecified: Secondary | ICD-10-CM | POA: Diagnosis present

## 2023-11-11 DIAGNOSIS — E162 Hypoglycemia, unspecified: Principal | ICD-10-CM | POA: Diagnosis present

## 2023-11-11 DIAGNOSIS — M25551 Pain in right hip: Secondary | ICD-10-CM | POA: Diagnosis present

## 2023-11-11 DIAGNOSIS — D72829 Elevated white blood cell count, unspecified: Secondary | ICD-10-CM | POA: Diagnosis present

## 2023-11-11 DIAGNOSIS — I5022 Chronic systolic (congestive) heart failure: Secondary | ICD-10-CM | POA: Diagnosis present

## 2023-11-11 DIAGNOSIS — D631 Anemia in chronic kidney disease: Secondary | ICD-10-CM | POA: Diagnosis present

## 2023-11-11 DIAGNOSIS — Z888 Allergy status to other drugs, medicaments and biological substances status: Secondary | ICD-10-CM

## 2023-11-11 DIAGNOSIS — R569 Unspecified convulsions: Secondary | ICD-10-CM | POA: Diagnosis present

## 2023-11-11 DIAGNOSIS — M25552 Pain in left hip: Secondary | ICD-10-CM | POA: Diagnosis present

## 2023-11-11 DIAGNOSIS — F319 Bipolar disorder, unspecified: Secondary | ICD-10-CM | POA: Diagnosis not present

## 2023-11-11 DIAGNOSIS — Z555 Less than a high school diploma: Secondary | ICD-10-CM

## 2023-11-11 DIAGNOSIS — Z992 Dependence on renal dialysis: Secondary | ICD-10-CM

## 2023-11-11 DIAGNOSIS — N186 End stage renal disease: Secondary | ICD-10-CM | POA: Diagnosis present

## 2023-11-11 DIAGNOSIS — Z880 Allergy status to penicillin: Secondary | ICD-10-CM

## 2023-11-11 DIAGNOSIS — Z885 Allergy status to narcotic agent status: Secondary | ICD-10-CM

## 2023-11-11 DIAGNOSIS — Z560 Unemployment, unspecified: Secondary | ICD-10-CM

## 2023-11-11 DIAGNOSIS — R9431 Abnormal electrocardiogram [ECG] [EKG]: Secondary | ICD-10-CM | POA: Diagnosis present

## 2023-11-11 DIAGNOSIS — Z79899 Other long term (current) drug therapy: Secondary | ICD-10-CM

## 2023-11-11 DIAGNOSIS — E1022 Type 1 diabetes mellitus with diabetic chronic kidney disease: Secondary | ICD-10-CM | POA: Diagnosis present

## 2023-11-11 DIAGNOSIS — Z833 Family history of diabetes mellitus: Secondary | ICD-10-CM

## 2023-11-11 DIAGNOSIS — R4182 Altered mental status, unspecified: Secondary | ICD-10-CM

## 2023-11-11 DIAGNOSIS — Z881 Allergy status to other antibiotic agents status: Secondary | ICD-10-CM

## 2023-11-11 DIAGNOSIS — Z9049 Acquired absence of other specified parts of digestive tract: Secondary | ICD-10-CM

## 2023-11-11 DIAGNOSIS — Z825 Family history of asthma and other chronic lower respiratory diseases: Secondary | ICD-10-CM

## 2023-11-11 LAB — COMPREHENSIVE METABOLIC PANEL WITH GFR
ALT: 62 U/L — ABNORMAL HIGH (ref 0–44)
AST: 78 U/L — ABNORMAL HIGH (ref 15–41)
Albumin: 2.2 g/dL — ABNORMAL LOW (ref 3.5–5.0)
Alkaline Phosphatase: 792 U/L — ABNORMAL HIGH (ref 38–126)
Anion gap: 14 (ref 5–15)
BUN: 42 mg/dL — ABNORMAL HIGH (ref 6–20)
CO2: 26 mmol/L (ref 22–32)
Calcium: 8.5 mg/dL — ABNORMAL LOW (ref 8.9–10.3)
Chloride: 102 mmol/L (ref 98–111)
Creatinine, Ser: 6.93 mg/dL — ABNORMAL HIGH (ref 0.44–1.00)
GFR, Estimated: 8 mL/min — ABNORMAL LOW (ref >60)
Glucose, Bld: 90 mg/dL (ref 70–99)
Potassium: 4.3 mmol/L (ref 3.5–5.1)
Sodium: 142 mmol/L (ref 135–145)
Total Bilirubin: 1.5 mg/dL — ABNORMAL HIGH (ref 0.0–1.2)
Total Protein: 5 g/dL — ABNORMAL LOW (ref 6.5–8.1)

## 2023-11-11 LAB — I-STAT VENOUS BLOOD GAS, ED
Acid-Base Excess: 7 mmol/L — ABNORMAL HIGH (ref 0.0–2.0)
Bicarbonate: 28.5 mmol/L — ABNORMAL HIGH (ref 20.0–28.0)
Calcium, Ion: 0.88 mmol/L — CL (ref 1.15–1.40)
HCT: 32 % — ABNORMAL LOW (ref 36.0–46.0)
Hemoglobin: 10.9 g/dL — ABNORMAL LOW (ref 12.0–15.0)
O2 Saturation: 100 %
Potassium: 5.5 mmol/L — ABNORMAL HIGH (ref 3.5–5.1)
Sodium: 137 mmol/L (ref 135–145)
TCO2: 29 mmol/L (ref 22–32)
pCO2, Ven: 27.7 mmHg — ABNORMAL LOW (ref 44–60)
pH, Ven: 7.62 (ref 7.25–7.43)
pO2, Ven: 178 mmHg — ABNORMAL HIGH (ref 32–45)

## 2023-11-11 LAB — DRUG SCREEN 10 W/CONF, SERUM
Amphetamines, IA: NEGATIVE ng/mL
Barbiturates, IA: NEGATIVE ug/mL
Benzodiazepines, IA: NEGATIVE ng/mL
Cocaine & Metabolite, IA: NEGATIVE ng/mL
Methadone, IA: NEGATIVE ng/mL
Phencyclidine, IA: NEGATIVE ng/mL
Propoxyphene, IA: NEGATIVE ng/mL
THC(Marijuana) Metabolite, IA: NEGATIVE ng/mL

## 2023-11-11 LAB — CBC WITH DIFFERENTIAL/PLATELET
Abs Immature Granulocytes: 0.11 K/uL — ABNORMAL HIGH (ref 0.00–0.07)
Basophils Absolute: 0.1 K/uL (ref 0.0–0.1)
Basophils Relative: 1 %
Eosinophils Absolute: 0.7 K/uL — ABNORMAL HIGH (ref 0.0–0.5)
Eosinophils Relative: 6 %
HCT: 34.7 % — ABNORMAL LOW (ref 36.0–46.0)
Hemoglobin: 10.7 g/dL — ABNORMAL LOW (ref 12.0–15.0)
Immature Granulocytes: 1 %
Lymphocytes Relative: 27 %
Lymphs Abs: 3 K/uL (ref 0.7–4.0)
MCH: 27.7 pg (ref 26.0–34.0)
MCHC: 30.8 g/dL (ref 30.0–36.0)
MCV: 89.9 fL (ref 80.0–100.0)
Monocytes Absolute: 1.4 K/uL — ABNORMAL HIGH (ref 0.1–1.0)
Monocytes Relative: 12 %
Neutro Abs: 5.8 K/uL (ref 1.7–7.7)
Neutrophils Relative %: 53 %
Platelets: 370 K/uL (ref 150–400)
RBC: 3.86 MIL/uL — ABNORMAL LOW (ref 3.87–5.11)
RDW: 14.8 % (ref 11.5–15.5)
WBC: 11 K/uL — ABNORMAL HIGH (ref 4.0–10.5)
nRBC: 0 % (ref 0.0–0.2)

## 2023-11-11 LAB — I-STAT CHEM 8, ED
BUN: 58 mg/dL — ABNORMAL HIGH (ref 6–20)
Calcium, Ion: 0.87 mmol/L — CL (ref 1.15–1.40)
Chloride: 105 mmol/L (ref 98–111)
Creatinine, Ser: 7 mg/dL — ABNORMAL HIGH (ref 0.44–1.00)
Glucose, Bld: 79 mg/dL (ref 70–99)
HCT: 34 % — ABNORMAL LOW (ref 36.0–46.0)
Hemoglobin: 11.6 g/dL — ABNORMAL LOW (ref 12.0–15.0)
Potassium: 5.6 mmol/L — ABNORMAL HIGH (ref 3.5–5.1)
Sodium: 136 mmol/L (ref 135–145)
TCO2: 30 mmol/L (ref 22–32)

## 2023-11-11 LAB — OXYCODONES,MS,WB/SP RFX

## 2023-11-11 LAB — CBC
HCT: 32.9 % — ABNORMAL LOW (ref 36.0–46.0)
Hemoglobin: 10.1 g/dL — ABNORMAL LOW (ref 12.0–15.0)
MCH: 27.2 pg (ref 26.0–34.0)
MCHC: 30.7 g/dL (ref 30.0–36.0)
MCV: 88.4 fL (ref 80.0–100.0)
Platelets: 266 K/uL (ref 150–400)
RBC: 3.72 MIL/uL — ABNORMAL LOW (ref 3.87–5.11)
RDW: 14.8 % (ref 11.5–15.5)
WBC: 11.6 K/uL — ABNORMAL HIGH (ref 4.0–10.5)
nRBC: 0 % (ref 0.0–0.2)

## 2023-11-11 LAB — HCG, SERUM, QUALITATIVE: Preg, Serum: NEGATIVE

## 2023-11-11 LAB — CBG MONITORING, ED
Glucose-Capillary: 41 mg/dL — CL (ref 70–99)
Glucose-Capillary: 81 mg/dL (ref 70–99)
Glucose-Capillary: 73 mg/dL (ref 70–99)
Glucose-Capillary: 105 mg/dL — ABNORMAL HIGH (ref 70–99)
Glucose-Capillary: 111 mg/dL — ABNORMAL HIGH (ref 70–99)
Glucose-Capillary: 133 mg/dL — ABNORMAL HIGH (ref 70–99)

## 2023-11-11 LAB — OPIATES,MS,WB/SP RFX

## 2023-11-11 LAB — PHOSPHORUS: Phosphorus: 7.7 mg/dL — ABNORMAL HIGH (ref 2.5–4.6)

## 2023-11-11 MED ORDER — PANCRELIPASE (LIP-PROT-AMYL) 12000-38000 UNITS PO CPEP
12000.0000 [IU] | ORAL_CAPSULE | Freq: Three times a day (TID) | ORAL | Status: DC
  Filled 2023-11-11 (×2): qty 1

## 2023-11-11 MED ORDER — SODIUM BICARBONATE 650 MG PO TABS
1300.0000 mg | ORAL_TABLET | Freq: Two times a day (BID) | ORAL | Status: DC
  Administered 2023-11-11: 1300 mg via ORAL
  Filled 2023-11-11: qty 2

## 2023-11-11 MED ORDER — GLUCAGON HCL RDNA (DIAGNOSTIC) 1 MG IJ SOLR: 1.0000 mg | Freq: Once | INTRAMUSCULAR | Status: DC | PRN

## 2023-11-11 MED ORDER — HYDROMORPHONE HCL 1 MG/ML IJ SOLN
0.5000 mg | INTRAMUSCULAR | Status: AC | PRN
  Administered 2023-11-11: 0.5 mg via INTRAVENOUS
  Filled 2023-11-11: qty 1

## 2023-11-11 MED ORDER — SODIUM CHLORIDE 0.9 % IV BOLUS
250.0000 mL | Freq: Once | INTRAVENOUS | Status: AC
  Administered 2023-11-11: 250 mL via INTRAVENOUS

## 2023-11-11 MED ORDER — LAMOTRIGINE 25 MG PO TABS: 200.0000 mg | ORAL_TABLET | Freq: Every day | ORAL | Status: DC

## 2023-11-11 MED ORDER — DULOXETINE HCL 20 MG PO CPEP
20.0000 mg | ORAL_CAPSULE | Freq: Two times a day (BID) | ORAL | Status: DC
  Administered 2023-11-11: 20 mg via ORAL
  Filled 2023-11-11: qty 1

## 2023-11-11 MED ORDER — DEXTROSE 50 % IV SOLN
1.0000 | INTRAVENOUS | Status: AC
  Administered 2023-11-11: 50 mL via INTRAVENOUS

## 2023-11-11 MED ORDER — PROCHLORPERAZINE EDISYLATE 10 MG/2ML IJ SOLN: 5.0000 mg | Freq: Four times a day (QID) | INTRAMUSCULAR | Status: DC | PRN

## 2023-11-11 MED ORDER — DEXTROSE 50 % IV SOLN: 50.0000 mL | INTRAVENOUS | Status: DC | PRN

## 2023-11-11 MED ORDER — SODIUM CHLORIDE 0.9 % IV SOLN
1.0000 g | INTRAVENOUS | Status: DC
  Administered 2023-11-11: 1 g via INTRAVENOUS
  Filled 2023-11-11: qty 10

## 2023-11-11 MED ORDER — POLYETHYLENE GLYCOL 3350 17 G PO PACK: 17.0000 g | PACK | Freq: Every day | ORAL | Status: DC | PRN

## 2023-11-11 MED ORDER — ATORVASTATIN CALCIUM 40 MG PO TABS: 80.0000 mg | ORAL_TABLET | Freq: Every day | ORAL | Status: DC

## 2023-11-11 MED ORDER — DEXTROSE 50 % IV SOLN: 50.0000 mL | Freq: Once | INTRAVENOUS | Status: DC

## 2023-11-11 MED ORDER — CHLORHEXIDINE GLUCONATE CLOTH 2 % EX PADS: 6.0000 | MEDICATED_PAD | Freq: Every day | CUTANEOUS | Status: DC

## 2023-11-11 MED ORDER — HEPARIN SODIUM (PORCINE) 5000 UNIT/ML IJ SOLN
5000.0000 [IU] | Freq: Three times a day (TID) | INTRAMUSCULAR | Status: DC
Start: 2023-11-11 — End: 2023-11-12
  Administered 2023-11-11 – 2023-11-12 (×2): 5000 [IU] via SUBCUTANEOUS
  Filled 2023-11-11 (×2): qty 1

## 2023-11-11 MED ORDER — ACETAMINOPHEN 325 MG PO TABS: 650.0000 mg | ORAL_TABLET | Freq: Four times a day (QID) | ORAL | Status: DC | PRN

## 2023-11-11 NOTE — ED Notes (Signed)
 CMP recollected by this RN and sent to lab.

## 2023-11-11 NOTE — ED Notes (Signed)
 Patient transported to CT

## 2023-11-11 NOTE — Progress Notes (Signed)
 Late Note Entry- November 11, 2023  Pt d/c on Saturday. Contacted [facility] this morning to be advised of pt's d/c date and that pt should resume care tomorrow. D/C summary and last renal note faxed to clinic for continuation of care.   Randine Mungo Renal Navigator [phone]

## 2023-11-11 NOTE — Consult Note (Signed)
 ESRD Consult Note  Requesting provider: Juliene Bicker Service requesting consult: ER Reason for consult: ESRD, provision of dialysis Indication for acute dialysis?: End Stage Renal Disease  Outpatient dialysis unit: [facility], [address] Outpatient dialysis prescription: OP HD: TTS [facility] [address] 3h  60kg   B350  AVG   Heparin  1600 + 600u/hr  Assessment/Recommendations: Ashley Freeman is a/an 33 y.o. female with a past medical history notable for ESRD on HD admitted with AMS  # ESRD: Recently admitted for volume overload.  Plan for dialysis tomorrow morning  # Volume/ hypertension: Plan for ultrafiltration with dialysis.  Some signs of moderate volume overload.  Continue home blood pressure medications  # Anemia of Chronic Kidney Disease: Hemoglobin 10.9.  No therapy needed  # Secondary Hyperparathyroidism/Hyperphosphatemia: Moderately low calcium .  Obtain outpatient records tomorrow for additional therapy needed  # Vascular access: AVG with no issues  # Altered mental status/hypoglycemia: Persistent altered mental status despite improving glucose.  Unclear cause of hypoglycemia but likely related to insulin  and possibly poor p.o. intake.  Management per primary team   # Additional recommendations: - Dose all meds for creatinine clearance < 10 ml/min  - Unless absolutely necessary, no MRIs with gadolinium.  - Implement save arm precautions.  Prefer needle sticks in the dorsum of the hands or wrists.  No blood pressure measurements in arm. - If blood transfusion is requested during hemodialysis sessions, please alert us  prior to the session.  - Use synthetic opioids (Fentanyl /Dilaudid ) if needed  Recommendations were discussed with the primary team.   History of Present Illness: Ashley Freeman is a/an 33 y.o. female with a past medical history of ESRD who presents with chief complaint of altered mental status.  History was  obtained per chart review  Patient was recently discharged on 7/12 after being admitted for volume overload.  Patient was brought to the hospital today for altered mental status.  Last had dialysis on Saturday.  Patient called her mom earlier today and said she did not feel well and was found to have a glucose of 21 when EMS arrived.  She was given dextrose  and had some mild improvement in her mental status after arriving.  She continued to be somewhat somnolent with mental status fluctuating.  She was able to tell the ER that she did not take any illicit substances but she did say she had bilateral lower extremity pain and also felt bad all over.  She did take insulin  this morning and did not take more than usual.  She denies any other symptoms of fevers, chills, nausea, vomiting, diarrhea.  In the emergency department labs were consistent with ESRD.  Alkaline phosphatase was elevated but has been elevated in the past as well.  Mild elevation in other LFTs.  Requiring some oxygen but being admitted for persistent altered mental status   Medications:  Current Facility-Administered Medications  Medication Dose Route Frequency Provider Last Rate Last Admin   acetaminophen  (TYLENOL ) tablet 650 mg  650 mg Oral Q6H PRN Shona Laurence N, DO       atorvastatin  (LIPITOR ) tablet 80 mg  80 mg Oral Daily Hall, Carole N, DO       dextrose  50 % solution 50 mL  50 mL Intravenous PRN Shona Laurence N, DO       DULoxetine  (CYMBALTA ) DR capsule 20 mg  20 mg Oral BID Hall, Carole N, DO       glucagon  (human recombinant) (GLUCAGEN ) injection 1 mg  1 mg Intravenous Once PRN Hall, Carole N, DO       heparin  injection 5,000 Units  5,000 Units Subcutaneous Q8H Hall, Carole N, DO       HYDROmorphone  (DILAUDID ) injection 0.5 mg  0.5 mg Intravenous Q4H PRN Shona Terry SAILOR, DO       [START ON 11/12/2023] lamoTRIgine  (LAMICTAL ) tablet 200 mg  200 mg Oral Daily Shona Terry N, DO       Pancrelipase  (Lip-Prot-Amyl) 3000-9500 units  CPEP   Oral TID Shona Terry N, DO       polyethylene glycol (MIRALAX  / GLYCOLAX ) packet 17 g  17 g Oral Daily PRN Shona Terry N, DO       prochlorperazine  (COMPAZINE ) injection 5 mg  5 mg Intravenous Q6H PRN Shona Terry SAILOR, DO       sodium bicarbonate  tablet 1,300 mg  1,300 mg Oral BID Shona Terry SAILOR, DO       Current Outpatient Medications  Medication Sig Dispense Refill   albuterol  (VENTOLIN  HFA) 108 (90 Base) MCG/ACT inhaler Inhale 2 puffs into the lungs every 4 (four) hours as needed for wheezing or shortness of breath. (Patient not taking: Reported on 11/02/2023)     atorvastatin  (LIPITOR ) 80 MG tablet Take 1 tablet (80 mg total) by mouth daily. 90 tablet 3   bumetanide  (BUMEX ) 2 MG tablet Take 2 mg by mouth daily.     calcitRIOL  (ROCALTROL ) 0.25 MCG capsule Take 5 capsules (1.25 mcg total) by mouth every Tuesday, Thursday, and Saturday at 6 PM. 30 capsule 0   carvedilol  (COREG ) 12.5 MG tablet Take 1 tablet (12.5 mg total) by mouth 2 (two) times daily with a meal. 30 tablet 0   Continuous Glucose Sensor (DEXCOM G7 SENSOR) MISC Change sensors every 10 days (Patient taking differently: Inject 1 Device into the skin See admin instructions. Place 1 new sensor into the skin every 10 days) 3 each 3   cyclobenzaprine  (FLEXERIL ) 5 MG tablet Take 1 tablet (5 mg total) by mouth 3 (three) times daily as needed for muscle spasms. 30 tablet 0   dicyclomine  (BENTYL ) 20 MG tablet Take 20 mg by mouth 2 (two) times daily as needed for spasms.     DULoxetine  (CYMBALTA ) 20 MG capsule Take 20 mg by mouth 2 (two) times daily.     famotidine  (PEPCID ) 20 MG tablet Take 20 mg by mouth daily before breakfast.     HYDROmorphone  (DILAUDID ) 2 MG tablet Take 1 tablet (2 mg total) by mouth every 6 (six) hours as needed for up to 7 days for severe pain (pain score 7-10) or moderate pain (pain score 4-6). 28 tablet 0   insulin  aspart (NOVOLOG ) 100 UNIT/ML injection Inject 5 Units into the skin 3 (three) times daily with  meals.     lamoTRIgine  (LAMICTAL ) 200 MG tablet Take 1 tablet (200 mg total) by mouth daily. 30 tablet 0   LANTUS  SOLOSTAR 100 UNIT/ML Solostar Pen Inject 10 Units into the skin in the morning.     losartan  (COZAAR ) 100 MG tablet Take 150 mg by mouth daily.     metoCLOPramide  (REGLAN ) 10 MG tablet Take 1 tablet (10 mg total) by mouth every 8 (eight) hours as needed for nausea. 12 tablet 0   OLANZapine  zydis (ZYPREXA ) 5 MG disintegrating tablet Take 5 mg by mouth at bedtime.     ondansetron  (ZOFRAN -ODT) 4 MG disintegrating tablet Take 1 tablet (4 mg total) by mouth every 8 (eight) hours as needed for nausea  or vomiting. 10 tablet 0   Pancrelipase , Lip-Prot-Amyl, 3000-9500 units CPEP Take 3,000 units of lipase by mouth 3 (three) times daily.     sevelamer  carbonate (RENVELA ) 800 MG tablet Take 2,400 mg by mouth 3 (three) times daily with meals.     sodium bicarbonate  650 MG tablet Take 1,300 mg by mouth 2 (two) times daily.     SUMAtriptan  (IMITREX ) 50 MG tablet Take 50 mg by mouth every 2 (two) hours as needed for migraine or headache.       ALLERGIES Cephalexin , Morphine , Penicillins, Fish allergy, Benadryl  [diphenhydramine ], Dilaudid  [hydromorphone ], Doxycycline , Methotrexate derivatives, and Oxycodone   MEDICAL HISTORY Past Medical History:  Diagnosis Date   Abscess, gluteal, right 08/24/2013   Anemia 02/19/2012   Bartholin's gland abscess 09/19/2013   Bipolar disorder (HCC)    BV (bacterial vaginosis) 11/24/2015   Depression    Diabetes mellitus type I (HCC) 2001   Diagnosed at age 8 ; Type I   Diarrhea 05/30/2016   DKA (diabetic ketoacidoses) 08/19/2013   Also in 2018   ESRD (end stage renal disease) (HCC)    Gonorrhea 08/2011   Treated in 09/2011   HFrEF (heart failure with reduced ejection fraction) (HCC)    a. 2022 Echo: EF 40%; b. 10/2021 Echo: EF 55%; b. 07/2022 MV: No ischemia. EF 31%; c. 08/2022 Echo: EF 35%, mildly dil RV, sev TR.   History of trichomoniasis 05/31/2016    Hyperlipidemia 03/28/2016   Hypertension    NICM (nonischemic cardiomyopathy) (HCC)    Sepsis (HCC) 09/19/2013     SOCIAL HISTORY Social History   Socioeconomic History   Marital status: Single    Spouse name: Not on file   Number of children: 0   Years of education: 11th grade   Highest education level: Not on file  Occupational History   Occupation: unemployed    Comment: has never worked  Tobacco Use   Smoking status: Never    Passive exposure: Never   Smokeless tobacco: Never  Vaping Use   Vaping status: Never Used  Substance and Sexual Activity   Alcohol use: Not Currently   Drug use: No   Sexual activity: Yes    Birth control/protection: None  Other Topics Concern   Not on file  Social History Narrative   Patient lives in [place] mother lives in [place].  Unemployed.  Previously worked for a Customer service manager.  Completed 11 grade working on [employer]. Patient 3 brothers    Social Drivers of Corporate investment banker Strain: Low Risk  (09/18/2023)   Received from [facility]   Overall Financial Resource Strain (CARDIA)    Difficulty of Paying Living Expenses: Not very hard  Food Insecurity: No Food Insecurity (11/03/2023)   Hunger Vital Sign    Worried About Running Out of Food in the Last Year: Never true    Ran Out of Food in the Last Year: Never true  Transportation Needs: Unmet Transportation Needs (11/03/2023)   PRAPARE - Administrator, Civil Service (Medical): Yes    Lack of Transportation (Non-Medical): Yes  Physical Activity: Insufficiently Active (03/02/2022)   Received from [facility]   Exercise Vital Sign    On average, how many days per week do you engage in moderate to strenuous exercise (like a brisk walk)?: 2 days    On average, how many minutes do you engage in exercise at this level?: 10 min  Stress: No Stress Concern Present (03/08/2023)   Received  from [facility] - Occupational  Stress Questionnaire    Feeling of Stress : Not at all  Social Connections: Moderately Integrated (08/28/2023)   Social Connection and Isolation Panel    Frequency of Communication with Friends and Family: More than three times a week    Frequency of Social Gatherings with Friends and Family: Once a week    Attends Religious Services: More than 4 times per year    Active Member of [employer] or Organizations: Yes    Attends Banker Meetings: Never    Marital Status: Never married  Intimate Partner Violence: Not At Risk (11/03/2023)   Humiliation, Afraid, Rape, and Kick questionnaire    Fear of Current or Ex-Partner: No    Emotionally Abused: No    Physically Abused: No    Sexually Abused: No     FAMILY HISTORY Family History  Problem Relation Age of Onset   Asthma Mother    Carpal tunnel syndrome Mother    Gout Father    Diabetes Paternal Grandmother    Anesthesia problems Neg Hx      Review of Systems: 12 systems were reviewed and negative except per HPI  Physical Exam: Vitals:   11/11/23 1800 11/11/23 1830  BP: (!) 108/54 (!) 92/47  Pulse: 75 69  Resp: (!) 31 16  Temp:    SpO2: 93% 92%   No intake/output data recorded. No intake or output data in the 24 hours ending 11/11/23 2058 General: Chronically ill-appearing, lying in bed, no distress HEENT: anicteric sclera, MMM CV: normal rate, no murmurs, no edema Lungs: bilateral chest rise, normal wob Abd: soft, non-tender, non-distended Skin: no visible lesions or rashes Psych: alert, engaged, appropriate mood and affect Neuro: Lethargic but normal speech, no other gross focal deficits   Test Results Reviewed Lab Results  Component Value Date   NA 142 11/11/2023   K 4.3 11/11/2023   CL 102 11/11/2023   CO2 26 11/11/2023   BUN 42 (H) 11/11/2023   CREATININE 6.93 (H) 11/11/2023   GFR 27.98 (L) 05/24/2021   CALCIUM  8.5 (L) 11/11/2023   ALBUMIN  2.2 (L) 11/11/2023   PHOS 6.9 (H) 11/07/2023    I have  reviewed relevant outside healthcare records

## 2023-11-11 NOTE — ED Triage Notes (Signed)
 Pt BIB [facility] from home due to hypoglycemia.  Pt has fistula right arm for dialysis. Initial CBG 21.  [facility] gave D10 through IO on left lower leg.  Pt responsive to painful stimuli.  CBG rolling into ED 27.  40 of lidocaine  given en route. VS BP 156/86, HR 88, SpO2 80% RA

## 2023-11-11 NOTE — ED Provider Notes (Signed)
 [facility] AT [facility] Provider Note   CSN: [id number] Arrival date & time: 11/11/23  1544  Patient presents with: Hypoglycemia   Sylwia Cuervo [place] is a 33 y.o. female with PMHx of Type 1 DM with recurrent DKA/HHS, ESRD on TTS HD, HFrEF dilated cardiomyopathy, seizures, bipolar disorder, frequent rehospitalizations who presented via EMS for acute hypoglycemia.  Patient reportedly called her mom earlier today stating that she felt bad, on EMS arrival CBG 21.  Patient given D10 through IO of [facility], was responsive to painful stimuli en route.  On initial ED arrival CBG 27.  Patient satting 80% on room air, placed on NRB.  Patient somnolent but able to provide intermittent history, denies any substance use or alcohol in the last 24 hours.  States that she fell out of her bed earlier this morning and thinks she hit her head, endorses pain in bilateral legs from hips to feet.  States that she took her insulin  last this morning.  Denies taking more than her usual amount of insulin .  Denies missing any recent HD sessions.  Patient was discharged from recent admission 7/6-7/12 for volume overload in setting of multiple missed HD sessions due to transport issues.   Prior to Admission medications   Medication Sig Start Date End Date Taking? Authorizing Provider  albuterol  (VENTOLIN  HFA) 108 (90 Base) MCG/ACT inhaler Inhale 2 puffs into the lungs every 4 (four) hours as needed for wheezing or shortness of breath. Patient not taking: Reported on 11/02/2023 11/24/22   [provider]  atorvastatin  (LIPITOR ) 80 MG tablet Take 1 tablet (80 mg total) by mouth daily. 10/15/23   Danford, Lonni SQUIBB, MD  bumetanide  (BUMEX ) 2 MG tablet Take 2 mg by mouth daily.    [provider]  calcitRIOL  (ROCALTROL ) 0.25 MCG capsule Take 5 capsules (1.25 mcg total) by mouth every Tuesday, Thursday, and Saturday at 6 PM. 07/09/23   Cindy Garnette POUR, MD  carvedilol  (COREG )  12.5 MG tablet Take 1 tablet (12.5 mg total) by mouth 2 (two) times daily with a meal. 10/29/23   Regalado, Belkys A, MD  Continuous Glucose Sensor (DEXCOM G7 SENSOR) MISC Change sensors every 10 days Patient taking differently: Inject 1 Device into the skin See admin instructions. Place 1 new sensor into the skin every 10 days 09/07/22   Shamleffer, Ibtehal Jaralla, MD  cyclobenzaprine  (FLEXERIL ) 5 MG tablet Take 1 tablet (5 mg total) by mouth 3 (three) times daily as needed for muscle spasms. 08/31/23   Ghimire, Donalda HERO, MD  dicyclomine  (BENTYL ) 20 MG tablet Take 20 mg by mouth 2 (two) times daily as needed for spasms.    [provider]  DULoxetine  (CYMBALTA ) 20 MG capsule Take 20 mg by mouth 2 (two) times daily. 07/29/23   [provider]  famotidine  (PEPCID ) 20 MG tablet Take 20 mg by mouth daily before breakfast. 07/29/23   [provider]  HYDROmorphone  (DILAUDID ) 2 MG tablet Take 1 tablet (2 mg total) by mouth every 6 (six) hours as needed for up to 7 days for severe pain (pain score 7-10) or moderate pain (pain score 4-6). 11/09/23 11/16/23  Ghimire, Kuber, MD  insulin  aspart (NOVOLOG ) 100 UNIT/ML injection Inject 5 Units into the skin 3 (three) times daily with meals.    [provider]  lamoTRIgine  (LAMICTAL ) 200 MG tablet Take 1 tablet (200 mg total) by mouth daily. 10/29/23   Regalado, Owen A, MD  LANTUS  SOLOSTAR 100 UNIT/ML Solostar Pen Inject  10 Units into the skin in the morning.    [provider]  losartan  (COZAAR ) 100 MG tablet Take 150 mg by mouth daily. 03/25/23   [provider]  metoCLOPramide  (REGLAN ) 10 MG tablet Take 1 tablet (10 mg total) by mouth every 8 (eight) hours as needed for nausea. 10/14/23   Danford, Lonni SQUIBB, MD  OLANZapine  zydis (ZYPREXA ) 5 MG disintegrating tablet Take 5 mg by mouth at bedtime. 09/23/23 12/22/23  [provider]  ondansetron  (ZOFRAN -ODT) 4 MG disintegrating tablet Take 1 tablet (4 mg  total) by mouth every 8 (eight) hours as needed for nausea or vomiting. 06/23/23   Davis, Jonathon H, MD  Pancrelipase , Lip-Prot-Amyl, 3000-9500 units CPEP Take 3,000 units of lipase by mouth 3 (three) times daily. 09/23/23 12/22/23  [provider]  sevelamer  carbonate (RENVELA ) 800 MG tablet Take 2,400 mg by mouth 3 (three) times daily with meals.    [provider]  sodium bicarbonate  650 MG tablet Take 1,300 mg by mouth 2 (two) times daily. 07/29/23   [provider]  SUMAtriptan  (IMITREX ) 50 MG tablet Take 50 mg by mouth every 2 (two) hours as needed for migraine or headache. 06/20/22   [provider]    Allergies: Cephalexin , Morphine , Penicillins, Fish allergy, Benadryl  [email or web address].[email or web address] ], Dilaudid  [hydromorphone ], Doxycycline , Methotrexate derivatives, and Oxycodone       Updated Vital Signs BP (!) 153/150 (BP Location: Right Leg)   Pulse 81   Temp (!) 95.7 F (35.4 C) (Temporal)   Resp 12   LMP 01/29/2020 (Approximate)   SpO2 92%   Physical Exam Vitals reviewed.  Constitutional:      Appearance: She is ill-appearing. She is not toxic-appearing or diaphoretic.     Comments: Arouses relatively easily and able to speak in full sentences, protecting airway on initial evaluation  HENT:     Head: Normocephalic and atraumatic.     Nose: Nose normal.     Mouth/Throat:     Mouth: Mucous membranes are moist.     Pharynx: Oropharynx is clear.  Eyes:     Extraocular Movements: Extraocular movements intact.     Conjunctiva/sclera: Conjunctivae normal.     Pupils: Pupils are equal, round, and reactive to light.     Comments: Pupils 3 mm bilaterally  Cardiovascular:     Rate and Rhythm: Normal rate and regular rhythm.     Heart sounds: No murmur heard.    No gallop.  Pulmonary:     Effort: No respiratory distress.     Breath sounds: No wheezing.     Comments: Mild bibasilar crackles Abdominal:     General: There is distension.     Palpations:  Abdomen is soft.     Tenderness: There is no abdominal tenderness. There is no guarding.  Musculoskeletal:        General: Normal range of motion.     Cervical back: Normal range of motion and neck supple. No tenderness.     Right lower leg: No edema.     Left lower leg: No edema.  Skin:    General: Skin is warm and dry.     Capillary Refill: Capillary refill takes 2 to 3 seconds.  Neurological:     Mental Status: She is lethargic.     GCS: GCS eye subscore is 2. GCS verbal subscore is 5. GCS motor subscore is 6.     Cranial Nerves: No dysarthria or facial asymmetry.     Motor: No seizure activity.  Comments: When aroused is oriented to person, place, and month     (all labs ordered are listed, but only abnormal results are displayed) Labs Reviewed  CBG MONITORING, ED - Abnormal; Notable for the following components:      Result Value   Glucose-Capillary 41 (*)    All other components within normal limits  CBC WITH DIFFERENTIAL/PLATELET  BASIC METABOLIC PANEL WITH GFR  CBG MONITORING, ED    EKG: None  Radiology: No results found.  {Document cardiac monitor, telemetry assessment procedure when appropriate:32947} Procedures   Medications Ordered in the ED  dextrose  50 % solution 50 mL (50 mLs Intravenous Given 11/11/23 1550)    Clinical Course as of 11/11/23 1922  Mon Nov 11, 2023  1700 DG Chest Portable 1 View Cardiomegaly with new pulmonary interstitial thickening, favoring interstitial edema. Bibasilar airspace disease is most likely atelectasis. Early infection, especially in the left lower lobe, cannot be excluded.   [AD]  1906 DG Pelvis Portable There is no evidence of pelvic fracture or diastasis. No pelvic bone lesions are seen.   [AD]  1906 CBC with Differential(!) Overall unremarkable [AD]  1907 Comprehensive metabolic panel(!) Mild increased T. bili to 1.5, alk phos elevated to 790, mildly increased AST/ALT.  Creatinine + BUN consistent with  baseline ESRD status.  Potassium normal at 4.3 [AD]  1908 pH, Ven(!!): 7.620 Bicarb normal, pCO2 28 [AD]  1914 CT Cervical Spine Wo Contrast Normal.  No evidence of acute traumatic injury. [AD]  1914 CT Head Wo Contrast Normal CT head/no acute findings. [AD]    Clinical Course User Index [AD] Raoul Rake, MD   {Click here for ABCD2, HEART and other calculators REFRESH Note before signing:1}                              Medical Decision Making Amount and/or Complexity of Data Reviewed Labs: ordered.  Risk Prescription drug management.   ***  {Document critical care time when appropriate  Document review of labs and clinical decision tools ie CHADS2VASC2, etc  Document your independent review of radiology images and any outside records  Document your discussion with family members, caretakers and with consultants  Document social determinants of health affecting pt's care  Document your decision making why or why not admission, treatments were needed:32947:::1}   Final diagnoses:  None    ED Discharge Orders     None

## 2023-11-11 NOTE — ED Notes (Signed)
 IV team at bedside

## 2023-11-11 NOTE — ED Provider Notes (Signed)
 Supervised resident visit.  Patient here for altered mental status.  Found to be hypoglycemic at home.  History of dialysis.  She last month Saturday.  She was given dextrose  on the way in.  She has had improvement of her mentation.  She is hypotensive that has improved.  But she is pretty somnolent.  She denies any alcohol or drugs.  Blood sugar was 41 on repeat here.  She is given more dextrose .  She has been monitored here blood sugar stabilized but she still somnolent.  Lab work otherwise unremarkable.  Potassium is 5.6.  She states that she might of fallen off her bed.  To get a CT scan of her head and neck which were unremarkable.  Pelvic x-ray unremarkable.  Chest x-ray with volume overload.  Overall I think she would benefit from observation stay to monitor her blood sugar.  She likely would benefit from dialysis in the morning as well.  She is due for dialysis tomorrow.  She denies any alcohol or drugs.  I do not think there is any major infectious process going on at this time but I do not think it is safe to discharge her to home at this time.  Will limit the hospitalist.  This chart was dictated using voice recognition software.  Despite best efforts to proofread,  errors can occur which can change the documentation meaning.    Ruthe Cornet, DO 11/11/23 1923

## 2023-11-11 NOTE — H&P (Addendum)
 History and Physical  Ashley Freeman FMW:[id number] DOB: December 06, 1990 DOA: 11/11/2023  Referring physician: Dr. Ruthe, EDP  PCP: Keven Crumbly Pap, MD  Outpatient Specialists: Nephrology, cardiology. Patient coming from: Home.  Chief Complaint: Altered mental status, severe hypoglycemia  HPI: Ashley Freeman is a 33 y.o. female with medical history significant for ESRD on HD TTS, bipolar disorder, type 1 diabetes, chronic HFrEF, frequent rehospitalizations, recently admitted for volume overload and discharged to home on 11/09/2023, who presents to the ER due to severe hypoglycemia.  EMS was activated and the patient was found to have a blood glucose level of 21 on arrival.  The patient was obtunded and D10 was administered via IO on left lower leg.  In the ER, repeat CBG was 41, treated with IV D50 with improvement.  Chest x-ray with mild pulmonary edema.  EDP discussed the case with nephrology who will take to hemodialysis on Tuesday.  TR[facility], hospitalist service, was asked to admit.  At time of this visit, the patient is alert and oriented, asking for opiates analgesics.  States she hurts all over.  States it is not new for her to have diffuse pain.  Endorses feeling cold.  She denies taking too much insulin .  Denies having fever, nausea, vomiting, or diarrhea.  ED Course: Temperature 98.4.  BP 100/57, pulse 78, respiratory 15, saturation 99% on 2 L [place].  Lab studies notable for WBC 11.0.  Antibiotics immature granulocytes 0.11.  Review of Systems: Review of systems as noted in the HPI. All other systems reviewed and are negative.   Past Medical History:  Diagnosis Date   Abscess, gluteal, right 08/24/2013   Anemia 02/19/2012   Bartholin's gland abscess 09/19/2013   Bipolar disorder (HCC)    BV (bacterial vaginosis) 11/24/2015   Depression    Diabetes mellitus type I (HCC) 2001   Diagnosed at age 55 ; Type I   Diarrhea 05/30/2016   DKA (diabetic ketoacidoses)  08/19/2013   Also in 2018   ESRD (end stage renal disease) (HCC)    Gonorrhea 08/2011   Treated in 09/2011   HFrEF (heart failure with reduced ejection fraction) (HCC)    a. 2022 Echo: EF 40%; b. 10/2021 Echo: EF 55%; b. 07/2022 MV: No ischemia. EF 31%; c. 08/2022 Echo: EF 35%, mildly dil RV, sev TR.   History of trichomoniasis 05/31/2016   Hyperlipidemia 03/28/2016   Hypertension    NICM (nonischemic cardiomyopathy) (HCC)    Sepsis (HCC) 09/19/2013   Past Surgical History:  Procedure Laterality Date   A/V FISTULAGRAM Right 06/17/2023   Procedure: A/V Fistulagram;  Surgeon: Marea Selinda RAMAN, MD;  Location: [facility] INVASIVE CV LAB;  Service: Cardiovascular;  Laterality: Right;   A/V SHUNT INTERVENTION N/A 09/25/2023   Procedure: A/V SHUNT INTERVENTION;  Surgeon: Pearline Norman RAMAN, MD;  Location: [facility];  Service: Cardiovascular;  Laterality: N/A;   AV FISTULA PLACEMENT Right 07/06/2022   Procedure: ARTERIOVENOUS GRAFT CREATION;  Surgeon: Gretta Lonni PARAS, MD;  Location: [facility] OR;  Service: Vascular;  Laterality: Right;   CESAREAN SECTION N/A 10/05/2019   Procedure: CESAREAN SECTION;  Surgeon: Izell Harari, MD;  Location: [facility] ORS;  Service: Obstetrics;  Laterality: N/A;   CHOLECYSTECTOMY N/A 07/02/2023   Procedure: LAPAROSCOPIC CHOLECYSTECTOMY;  Surgeon: Ebbie Cough, MD;  Location: [facility];  Service: General;  Laterality: N/A;   INCISION AND DRAINAGE ABSCESS Left 09/28/2019   Procedure: INCISION AND DRAINAGE VULVAR ABCESS;  Surgeon: Edsel Norleen GAILS, MD;  Location: [facility] OR;  Service: Gynecology;  Laterality: Left;   INCISION AND DRAINAGE PERIRECTAL ABSCESS Right 08/18/2013   Procedure: IRRIGATION AND DEBRIDEMENT GLUTEAL ABSCESS;  Surgeon: Lynda Leos, MD;  Location: [facility] OR;  Service: General;  Laterality: Right;   INCISION AND DRAINAGE PERIRECTAL ABSCESS Right 09/19/2013   Procedure: IRRIGATION AND DEBRIDEMENT RIGHT GLUTEAL AND LABIAL ABSCESSES;  Surgeon: Lynda Leos, MD;   Location: [facility] OR;  Service: General;  Laterality: Right;   INCISION AND DRAINAGE PERIRECTAL ABSCESS Right 09/24/2013   Procedure: IRRIGATION AND DEBRIDEMENT PERIRECTAL ABSCESS;  Surgeon: Lynwood MALVA Pina, MD;  Location: [facility] OR;  Service: General;  Laterality: Right;   IR PARACENTESIS  08/28/2023   IR PARACENTESIS  11/04/2023    Social History:  reports that she has never smoked. She has never been exposed to tobacco smoke. She has never used smokeless tobacco. She reports that she does not currently use alcohol. She reports that she does not use drugs.   Allergies  Allergen Reactions   Cephalexin  Anaphylaxis and Other (See Comments)    Has gotten ceftriaxone  in the past    Morphine  Itching   Penicillins Hives and Rash   Fish Allergy Other (See Comments)    Allergic   Benadryl  [Diphenhydramine ] Itching   Dilaudid  [Hydromorphone ] Itching   Doxycycline  Itching   Methotrexate Derivatives Rash   Oxycodone  Itching    Family History  Problem Relation Age of Onset   Asthma Mother    Carpal tunnel syndrome Mother    Gout Father    Diabetes Paternal Grandmother    Anesthesia problems Neg Hx       Prior to Admission medications   Medication Sig Start Date End Date Taking? Authorizing Provider  albuterol  (VENTOLIN  HFA) 108 (90 Base) MCG/ACT inhaler Inhale 2 puffs into the lungs every 4 (four) hours as needed for wheezing or shortness of breath. Patient not taking: Reported on 11/02/2023 11/24/22   [provider]  atorvastatin  (LIPITOR ) 80 MG tablet Take 1 tablet (80 mg total) by mouth daily. 10/15/23   Danford, Lonni SQUIBB, MD  bumetanide  (BUMEX ) 2 MG tablet Take 2 mg by mouth daily.    [provider]  calcitRIOL  (ROCALTROL ) 0.25 MCG capsule Take 5 capsules (1.25 mcg total) by mouth every Tuesday, Thursday, and Saturday at 6 PM. 07/09/23   Cindy Garnette POUR, MD  carvedilol  (COREG ) 12.5 MG tablet Take 1 tablet (12.5 mg total) by mouth 2 (two) times daily with a meal. 10/29/23    Regalado, Belkys A, MD  Continuous Glucose Sensor (DEXCOM G7 SENSOR) MISC Change sensors every 10 days Patient taking differently: Inject 1 Device into the skin See admin instructions. Place 1 new sensor into the skin every 10 days 09/07/22   Shamleffer, Ibtehal Jaralla, MD  cyclobenzaprine  (FLEXERIL ) 5 MG tablet Take 1 tablet (5 mg total) by mouth 3 (three) times daily as needed for muscle spasms. 08/31/23   Ghimire, Donalda HERO, MD  dicyclomine  (BENTYL ) 20 MG tablet Take 20 mg by mouth 2 (two) times daily as needed for spasms.    [provider]  DULoxetine  (CYMBALTA ) 20 MG capsule Take 20 mg by mouth 2 (two) times daily. 07/29/23   [provider]  famotidine  (PEPCID ) 20 MG tablet Take 20 mg by mouth daily before breakfast. 07/29/23   [provider]  HYDROmorphone  (DILAUDID ) 2 MG tablet Take 1 tablet (2 mg total) by mouth every 6 (six) hours as needed for up to 7 days for severe pain (pain score 7-10) or moderate pain (pain  score 4-6). 11/09/23 11/16/23  Ghimire, Kuber, MD  insulin  aspart (NOVOLOG ) 100 UNIT/ML injection Inject 5 Units into the skin 3 (three) times daily with meals.    [provider]  lamoTRIgine  (LAMICTAL ) 200 MG tablet Take 1 tablet (200 mg total) by mouth daily. 10/29/23   Regalado, Belkys A, MD  LANTUS  SOLOSTAR 100 UNIT/ML Solostar Pen Inject 10 Units into the skin in the morning.    [provider]  losartan  (COZAAR ) 100 MG tablet Take 150 mg by mouth daily. 03/25/23   [provider]  metoCLOPramide  (REGLAN ) 10 MG tablet Take 1 tablet (10 mg total) by mouth every 8 (eight) hours as needed for nausea. 10/14/23   Danford, Lonni SQUIBB, MD  OLANZapine  zydis (ZYPREXA ) 5 MG disintegrating tablet Take 5 mg by mouth at bedtime. 09/23/23 12/22/23  [provider]  ondansetron  (ZOFRAN -ODT) 4 MG disintegrating tablet Take 1 tablet (4 mg total) by mouth every 8 (eight) hours as needed for nausea or vomiting. 06/23/23   Davis, Jonathon H,  MD  Pancrelipase , Lip-Prot-Amyl, 3000-9500 units CPEP Take 3,000 units of lipase by mouth 3 (three) times daily. 09/23/23 12/22/23  [provider]  sevelamer  carbonate (RENVELA ) 800 MG tablet Take 2,400 mg by mouth 3 (three) times daily with meals.    [provider]  sodium bicarbonate  650 MG tablet Take 1,300 mg by mouth 2 (two) times daily. 07/29/23   [provider]  SUMAtriptan  (IMITREX ) 50 MG tablet Take 50 mg by mouth every 2 (two) hours as needed for migraine or headache. 06/20/22   [provider]    Physical Exam: BP (!) 92/47   Pulse 69   Temp (!) 95.7 F (35.4 C) (Temporal)   Resp 16   LMP 01/29/2020 (Approximate)   SpO2 92%   General: 33 y.o. year-old female well developed well nourished in no acute distress.  Alert and oriented x3. Cardiovascular: Regular rate and rhythm with no rubs or gallops.  No thyromegaly or JVD noted.  No lower extremity edema. 2/4 pulses in all 4 extremities. Respiratory: Clear to auscultation with no wheezes or rales. Good inspiratory effort. Abdomen: Soft nontender nondistended with normal bowel sounds x4 quadrants. Muskuloskeletal: No cyanosis, clubbing or edema noted bilaterally Neuro: CN II-XII intact, strength, sensation, reflexes Skin: No ulcerative lesions noted or rashes Psychiatry: Judgement and insight appear normal. Mood is appropriate for condition and setting          Labs on Admission:  Basic Metabolic Panel: Recent Labs  Lab 11/05/23 0806 11/07/23 1451 11/11/23 1623 11/11/23 1711  NA 131* 133* 137  136 142  K 5.3* 5.2* 5.5*  5.6* 4.3  CL 95* 95* 105 102  CO2 22 23  --  26  GLUCOSE 242* 235* 79 90  BUN 58* 55* 58* 42*  CREATININE 7.60* 7.14* 7.00* 6.93*  CALCIUM  8.5* 8.5*  --  8.5*  PHOS 7.0* 6.9*  --   --    Liver Function Tests: Recent Labs  Lab 11/05/23 0806 11/07/23 1451 11/11/23 1711  AST  --   --  78*  ALT  --   --  62*  ALKPHOS  --   --  792*  BILITOT  --   --  1.5*   PROT  --   --  5.0*  ALBUMIN  2.3* 2.2* 2.2*   No results for input(s): LIPASE, AMYLASE in the last 168 hours. No results for input(s): AMMONIA in the last 168 hours. CBC: Recent Labs  Lab 11/05/23  9193 11/07/23 1451 11/11/23 1618 11/11/23 1623  WBC 13.0* 10.5 11.0*  --   NEUTROABS  --   --  5.8  --   HGB 10.6* 10.0* 10.7* 10.9*  11.6*  HCT 33.4* 32.5* 34.7* 32.0*  34.0*  MCV 86.8 87.8 89.9  --   PLT 263 260 370  --    Cardiac Enzymes: No results for input(s): CKTOTAL, CKMB, CKMBINDEX, TROPONINI in the last 168 hours.  BNP (last 3 results) Recent Labs    10/12/23 0500 10/23/23 0310 10/25/23 0830  BNP >4,500.0* >4,500.0* >4,500.0*    ProBNP (last 3 results) No results for input(s): PROBNP in the last 8760 hours.  CBG: Recent Labs  Lab 11/09/23 1143 11/11/23 1547 11/11/23 1614 11/11/23 1651 11/11/23 1758  GLUCAP 144* 41* 81 73 105*    Radiological Exams on Admission: CT Cervical Spine Wo Contrast Result Date: 11/11/2023 CLINICAL DATA:  Polytrauma, blunt EXAM: CT CERVICAL SPINE WITHOUT CONTRAST TECHNIQUE: Multidetector CT imaging of the cervical spine was performed without intravenous contrast. Multiplanar CT image reconstructions were also generated. RADIATION DOSE REDUCTION: This exam was performed according to the departmental dose-optimization program which includes automated exposure control, adjustment of the mA and/or kV according to patient size and/or use of iterative reconstruction technique. COMPARISON:  None Available. FINDINGS: Alignment: Mild reversal of the normal cervical lordosis and slight dextrocurvature. Skull base and vertebrae: Unremarkable.  No fracture or dislocation. Soft tissues and spinal canal: No soft tissue swelling or soft tissue injury evident. Disc levels: The disc spaces are well preserved and the spinal canal and neural foramina are widely patent. Upper chest: Clear. Other: None. IMPRESSION: Normal.  No evidence of  acute traumatic injury. Electronically Signed   By: Evalene Coho M.D.   On: 11/11/2023 19:10   CT Head Wo Contrast Result Date: 11/11/2023 CLINICAL DATA:  Mental status change, unknown cause EXAM: CT HEAD WITHOUT CONTRAST TECHNIQUE: Contiguous axial images were obtained from the base of the skull through the vertex without intravenous contrast. RADIATION DOSE REDUCTION: This exam was performed according to the departmental dose-optimization program which includes automated exposure control, adjustment of the mA and/or kV according to patient size and/or use of iterative reconstruction technique. COMPARISON:  CT of the head dated October 31, 2023. FINDINGS: Brain: Normal brain. No evidence of hemorrhage, mass, acute cortical infarct or hydrocephalus. Vascular: Negative. Skull: Intact and unremarkable. Sinuses/Orbits: Clear.  Status post bilateral lens replacement. Other: None. IMPRESSION: Normal. Electronically Signed   By: Evalene Coho M.D.   On: 11/11/2023 19:08   DG Pelvis Portable Result Date: 11/11/2023 CLINICAL DATA:  Hip pain. EXAM: PORTABLE PELVIS 1-2 VIEWS COMPARISON:  None Available. FINDINGS: There is no evidence of pelvic fracture or diastasis. No pelvic bone lesions are seen. IMPRESSION: Negative. Electronically Signed   By: Lynwood Landy Raddle M.D.   On: 11/11/2023 18:28   DG Chest Portable 1 View Result Date: 11/11/2023 CLINICAL DATA:  Altered mental status.  Hypoxia. EXAM: PORTABLE CHEST 1 VIEW COMPARISON:  11/02/2023 FINDINGS: Apical lordotic frontal radiograph. Moderate cardiomegaly. No pleural effusion or pneumothorax. Development of mild diffuse interstitial thickening. Developing bibasilar airspace disease. IMPRESSION: Cardiomegaly with new pulmonary interstitial thickening, favoring interstitial edema. Bibasilar airspace disease is most likely atelectasis. Early infection, especially in the left lower lobe, cannot be excluded. Consider PA and lateral radiographs if feasible.  Electronically Signed   By: Rockey Kilts M.D.   On: 11/11/2023 16:52    EKG: I independently viewed the EKG done and my findings  are as followed: Sinus rhythm rate of 79.  Nonspecific ST-T changes.  QTc 575.  Assessment/Plan Present on Admission:  Hypoglycemia  Principal Problem:   Hypoglycemia  Severe hypoglycemia in the setting of type 1 diabetes CBG 21 on EMS arrival Denies taking too much insulin  Diabetic coordinator to assist with patient's education Last hemoglobin A1c 10.3 on 08/27/2023. Hold off insulin  for now and closely monitor CBG every 2 hours x 4 Insulin  will be restarted judiciously, history of type 1 diabetes IV D50 as needed, IV glucagon  as needed for hypoglycemia  ESRD on HD TTS Last hemodialysis session was on Saturday, 11/09/2023 Plan for hemodialysis on Tuesday 11/12/2023 Volume status and electrolytes managed with hemodialysis. Appreciate nephrology's assistance  Anemia of chronic disease Hemoglobin is stable  Cold intolerance Follow TSH  Leukocytosis and hypoglycemia Possible left lower lobe pneumonia, POA WBC 11.0, antibody immature granulocytes 0.11 Rule out early infection Follow peripheral blood cultures x 2. Repeat CBC with differentials in the morning and follow baseline procalcitonin. Chest x-ray, early infection cannot be ruled out in left lower lobe. Will cover with Zosyn empirically.  QTc prolongation QTc on admission twelve-lead EKG 575 Avoid QTc prolonging agents Monitor on telemetry  Hyperlipidemia Resume home regimen  Bipolar disorder Resume home resume  Chronic HFrEF Euvolemic Strict I's and O's and daily weight   Critical care time: 65 minutes.   DVT prophylaxis: Subcu heparin  3 times daily  Code Status: Full code.  Family Communication: None at bedside.  Disposition Plan: Admitted to progressive care unit.  Consults called: Nephrology.  Admission status: Inpatient status.   Status is: Inpatient The patient  requires at least 2 midnights for further evaluation and treatment of present condition.   Terry LOISE Hurst MD [facility] Pager [phone]  If 7PM-7AM, please contact night-coverage [email or web address] Password [facility]  11/11/2023, 8:26 PM

## 2023-11-12 DIAGNOSIS — E162 Hypoglycemia, unspecified: Secondary | ICD-10-CM | POA: Diagnosis not present

## 2023-11-12 LAB — RENAL FUNCTION PANEL
Albumin: 2.4 g/dL — ABNORMAL LOW (ref 3.5–5.0)
Anion gap: 18 — ABNORMAL HIGH (ref 5–15)
BUN: 44 mg/dL — ABNORMAL HIGH (ref 6–20)
CO2: 26 mmol/L (ref 22–32)
Calcium: 8.9 mg/dL (ref 8.9–10.3)
Chloride: 98 mmol/L (ref 98–111)
Creatinine, Ser: 7.15 mg/dL — ABNORMAL HIGH (ref 0.44–1.00)
GFR, Estimated: 7 mL/min — ABNORMAL LOW (ref >60)
Glucose, Bld: 130 mg/dL — ABNORMAL HIGH (ref 70–99)
Phosphorus: 8.2 mg/dL — ABNORMAL HIGH (ref 2.5–4.6)
Potassium: 5.2 mmol/L — ABNORMAL HIGH (ref 3.5–5.1)
Sodium: 142 mmol/L (ref 135–145)

## 2023-11-12 LAB — CBC
HCT: 35.6 % — ABNORMAL LOW (ref 36.0–46.0)
Hemoglobin: 10.7 g/dL — ABNORMAL LOW (ref 12.0–15.0)
MCH: 27 pg (ref 26.0–34.0)
MCHC: 30.1 g/dL (ref 30.0–36.0)
MCV: 89.7 fL (ref 80.0–100.0)
Platelets: 275 K/uL (ref 150–400)
RBC: 3.97 MIL/uL (ref 3.87–5.11)
RDW: 14.7 % (ref 11.5–15.5)
WBC: 11.3 K/uL — ABNORMAL HIGH (ref 4.0–10.5)
nRBC: 0 % (ref 0.0–0.2)

## 2023-11-12 LAB — CBG MONITORING, ED
Glucose-Capillary: 118 mg/dL — ABNORMAL HIGH (ref 70–99)
Glucose-Capillary: 127 mg/dL — ABNORMAL HIGH (ref 70–99)

## 2023-11-12 LAB — TSH: TSH: 6.845 u[IU]/mL — ABNORMAL HIGH (ref 0.350–4.500)

## 2023-11-12 LAB — PROCALCITONIN: Procalcitonin: 1.01 ng/mL

## 2023-11-12 MED ORDER — HEPARIN SODIUM (PORCINE) 1000 UNIT/ML DIALYSIS: 1000.0000 [IU] | INTRAMUSCULAR | Status: DC | PRN

## 2023-11-12 MED ORDER — PENTAFLUOROPROP-TETRAFLUOROETH EX AERO: 1.0000 | INHALATION_SPRAY | CUTANEOUS | Status: DC | PRN

## 2023-11-12 MED ORDER — LIDOCAINE-PRILOCAINE 2.5-2.5 % EX CREA: 1.0000 | TOPICAL_CREAM | CUTANEOUS | Status: DC | PRN

## 2023-11-12 MED ORDER — HYDROMORPHONE HCL 1 MG/ML IJ SOLN
0.5000 mg | INTRAMUSCULAR | Status: AC | PRN
  Administered 2023-11-12 (×2): 0.5 mg via INTRAVENOUS
  Filled 2023-11-12 (×2): qty 1

## 2023-11-12 MED ORDER — ALTEPLASE 2 MG IJ SOLR: 2.0000 mg | Freq: Once | INTRAMUSCULAR | Status: DC | PRN

## 2023-11-12 MED ORDER — OXYCODONE HCL 5 MG PO TABS: 5.0000 mg | ORAL_TABLET | Freq: Four times a day (QID) | ORAL | Status: DC | PRN

## 2023-11-12 MED ORDER — LIDOCAINE HCL (PF) 1 % IJ SOLN: 5.0000 mL | INTRAMUSCULAR | Status: DC | PRN

## 2023-11-12 NOTE — ED Notes (Signed)
 Patient declined signing AMA form.

## 2023-11-12 NOTE — ED Notes (Signed)
 Patient wants to leave AMA, to have dialysis at her center so they do not discharge out of their system due to missing multiple treatments, Lue, MD notified.

## 2023-11-12 NOTE — Discharge Summary (Signed)
 Physician Discharge Summary  Ashley Freeman FMW:[id number] DOB: [birth year] DOA: 11/11/2023  PCP: Keven Crumbly Pap, MD  Admit date: 11/11/2023 Discharge date: 11/12/2023  Admitted From: Home Disposition: Unknown  Brief/Interim Summary:  Unfortunately patient left hospital AGAINST MEDICAL ADVICE prior to evaluation.  Briefly patient was admitted overnight for symptomatic hypoglycemia that appears to have resolved.  She was tolerating p.o. and was requesting discharge to outpatient hemodialysis as she was at risk for losing her dialysis slot per nursing staff.  This is patient's ninth ED visit/admission in the past 45 days.  Hopefully patient will follow-up with PCP, nephrology and dialysis to continue monitoring her symptoms patient's most recent hemoglobin A1c 2 months ago was 10.3, it has not been within appropriate range prior labs for over 2 years.  Discharge Diagnoses:  Principal Problem:   Hypoglycemia    Discharge Instructions   Allergies as of 11/12/2023       Reactions   Cephalexin  Anaphylaxis, Other (See Comments)   Has gotten ceftriaxone  in the past    Morphine  Itching   Penicillins Hives, Rash   Fish Allergy Other (See Comments)   Allergic   Benadryl  [diphenhydramine ] Itching   Dilaudid  [hydromorphone ] Itching   Doxycycline  Itching   Methotrexate Derivatives Rash   Oxycodone  Itching        Medication List     ASK your doctor about these medications    albuterol  108 (90 Base) MCG/ACT inhaler Commonly known as: VENTOLIN  HFA Inhale 2 puffs into the lungs every 4 (four) hours as needed for wheezing or shortness of breath.   atorvastatin  80 MG tablet Commonly known as: LIPITOR  Take 1 tablet (80 mg total) by mouth daily.   bumetanide  2 MG tablet Commonly known as: BUMEX  Take 2 mg by mouth daily.   calcitRIOL  0.25 MCG capsule Commonly known as: ROCALTROL  Take 5 capsules (1.25 mcg total) by mouth every Tuesday, Thursday, and Saturday at 6  PM.   carvedilol  12.5 MG tablet Commonly known as: COREG  Take 1 tablet (12.5 mg total) by mouth 2 (two) times daily with a meal.   cyclobenzaprine  5 MG tablet Commonly known as: FLEXERIL  Take 1 tablet (5 mg total) by mouth 3 (three) times daily as needed for muscle spasms.   Dexcom G7 Sensor Misc Change sensors every 10 days   dicyclomine  20 MG tablet Commonly known as: BENTYL  Take 20 mg by mouth 2 (two) times daily as needed for spasms.   DULoxetine  20 MG capsule Commonly known as: CYMBALTA  Take 20 mg by mouth 2 (two) times daily.   famotidine  20 MG tablet Commonly known as: PEPCID  Take 20 mg by mouth daily before breakfast.   HYDROmorphone  2 MG tablet Commonly known as: DILAUDID  Take 1 tablet (2 mg total) by mouth every 6 (six) hours as needed for up to 7 days for severe pain (pain score 7-10) or moderate pain (pain score 4-6).   insulin  aspart 100 UNIT/ML injection Commonly known as: novoLOG  Inject 5 Units into the skin 3 (three) times daily with meals.   lamoTRIgine  200 MG tablet Commonly known as: LAMICTAL  Take 1 tablet (200 mg total) by mouth daily.   Lantus  SoloStar 100 UNIT/ML Solostar Pen Generic drug: insulin  glargine Inject 10 Units into the skin in the morning.   losartan  100 MG tablet Commonly known as: COZAAR  Take 150 mg by mouth daily.   metoCLOPramide  10 MG tablet Commonly known as: REGLAN  Take 1 tablet (10 mg total) by mouth every 8 (eight) hours as needed for  nausea.   OLANZapine  zydis 5 MG disintegrating tablet Commonly known as: ZYPREXA  Take 5 mg by mouth at bedtime.   ondansetron  4 MG disintegrating tablet Commonly known as: ZOFRAN -ODT Take 1 tablet (4 mg total) by mouth every 8 (eight) hours as needed for nausea or vomiting.   Pancrelipase  (Lip-Prot-Amyl) 3000-9500 units Cpep Take 3,000 units of lipase by mouth 3 (three) times daily.   sevelamer  carbonate 800 MG tablet Commonly known as: RENVELA  Take 2,400 mg by mouth 3 (three) times  daily with meals.   sodium bicarbonate  650 MG tablet Take 1,300 mg by mouth 2 (two) times daily.   SUMAtriptan  50 MG tablet Commonly known as: IMITREX  Take 50 mg by mouth every 2 (two) hours as needed for migraine or headache.        Allergies  Allergen Reactions   Cephalexin  Anaphylaxis and Other (See Comments)    Has gotten ceftriaxone  in the past    Morphine  Itching   Penicillins Hives and Rash   Fish Allergy Other (See Comments)    Allergic   Benadryl  [Diphenhydramine ] Itching   Dilaudid  [Hydromorphone ] Itching   Doxycycline  Itching   Methotrexate Derivatives Rash   Oxycodone  Itching    Consultations: None   Procedures/Studies: CT Cervical Spine Wo Contrast Result Date: 11/11/2023 CLINICAL DATA:  Polytrauma, blunt EXAM: CT CERVICAL SPINE WITHOUT CONTRAST TECHNIQUE: Multidetector CT imaging of the cervical spine was performed without intravenous contrast. Multiplanar CT image reconstructions were also generated. RADIATION DOSE REDUCTION: This exam was performed according to the departmental dose-optimization program which includes automated exposure control, adjustment of the mA and/or kV according to patient size and/or use of iterative reconstruction technique. COMPARISON:  None Available. FINDINGS: Alignment: Mild reversal of the normal cervical lordosis and slight dextrocurvature. Skull base and vertebrae: Unremarkable.  No fracture or dislocation. Soft tissues and spinal canal: No soft tissue swelling or soft tissue injury evident. Disc levels: The disc spaces are well preserved and the spinal canal and neural foramina are widely patent. Upper chest: Clear. Other: None. IMPRESSION: Normal.  No evidence of acute traumatic injury. Electronically Signed   By: Evalene Coho M.D.   On: 11/11/2023 19:10   CT Head Wo Contrast Result Date: 11/11/2023 CLINICAL DATA:  Mental status change, unknown cause EXAM: CT HEAD WITHOUT CONTRAST TECHNIQUE: Contiguous axial images were  obtained from the base of the skull through the vertex without intravenous contrast. RADIATION DOSE REDUCTION: This exam was performed according to the departmental dose-optimization program which includes automated exposure control, adjustment of the mA and/or kV according to patient size and/or use of iterative reconstruction technique. COMPARISON:  CT of the head dated October 31, 2023. FINDINGS: Brain: Normal brain. No evidence of hemorrhage, mass, acute cortical infarct or hydrocephalus. Vascular: Negative. Skull: Intact and unremarkable. Sinuses/Orbits: Clear.  Status post bilateral lens replacement. Other: None. IMPRESSION: Normal. Electronically Signed   By: Evalene Coho M.D.   On: 11/11/2023 19:08   DG Pelvis Portable Result Date: 11/11/2023 CLINICAL DATA:  Hip pain. EXAM: PORTABLE PELVIS 1-2 VIEWS COMPARISON:  None Available. FINDINGS: There is no evidence of pelvic fracture or diastasis. No pelvic bone lesions are seen. IMPRESSION: Negative. Electronically Signed   By: Lynwood Landy Raddle M.D.   On: 11/11/2023 18:28   DG Chest Portable 1 View Result Date: 11/11/2023 CLINICAL DATA:  Altered mental status.  Hypoxia. EXAM: PORTABLE CHEST 1 VIEW COMPARISON:  11/02/2023 FINDINGS: Apical lordotic frontal radiograph. Moderate cardiomegaly. No pleural effusion or pneumothorax. Development of mild diffuse interstitial  thickening. Developing bibasilar airspace disease. IMPRESSION: Cardiomegaly with new pulmonary interstitial thickening, favoring interstitial edema. Bibasilar airspace disease is most likely atelectasis. Early infection, especially in the left lower lobe, cannot be excluded. Consider PA and lateral radiographs if feasible. Electronically Signed   By: Rockey Kilts M.D.   On: 11/11/2023 16:52   CT ABDOMEN PELVIS W CONTRAST Result Date: 11/06/2023 CLINICAL DATA:  Diabetes recent DKA abdominal distension and leg edema fluid overload EXAM: CT ABDOMEN AND PELVIS WITH CONTRAST TECHNIQUE: Multidetector  CT imaging of the abdomen and pelvis was performed using the standard protocol following bolus administration of intravenous contrast. RADIATION DOSE REDUCTION: This exam was performed according to the departmental dose-optimization program which includes automated exposure control, adjustment of the mA and/or kV according to patient size and/or use of iterative reconstruction technique. CONTRAST:  60mL OMNIPAQUE  IOHEXOL  350 MG/ML SOLN COMPARISON:  CT 08/27/2023, 07/19/2023 FINDINGS: Lower chest: Lung bases demonstrate no acute airspace disease. Cardiomegaly. Trace right pleural effusion. Hepatobiliary: Liver is enlarged, craniocaudal measurement of 23 cm. Diffuse heterogenous liver parenchymal density. Cholecystectomy. Stable crescent shaped density the gallbladder fossa, may reflect a small chronic fluid collection or postsurgical change. No biliary dilatation Pancreas: Unremarkable. No pancreatic ductal dilatation or surrounding inflammatory changes. Spleen: Normal in size without focal abnormality. Adrenals/Urinary Tract: Adrenal glands are normal. Kidneys show no hydronephrosis. The bladder is diffusely thick walled but nearly empty Stomach/Bowel: Moderate debris in the stomach. Mild diffuse air distension of the small bowel without transition point. Moderate stool burden. Negative appendix. Vascular/Lymphatic: Nonaneurysmal aorta. Atherosclerosis. Multiple mildly prominent retroperitoneal nodes, less than a cm. Reproductive: Uterus and bilateral adnexa are unremarkable. Other: No free air. Moderate volume of ascites in the abdomen with moderate large pelvic ascites. Considerable subcutaneous edema. Large amount of fluid at the labia. Musculoskeletal: No acute osseous abnormality IMPRESSION: 1. Hepatomegaly with heterogenous appearance of the liver which could be secondary to passive congestion, or potential hepatic inflammation/hepatocellular disease. 2. Moderate volume of ascites in the abdomen with moderate  to large pelvic ascites. Considerable subcutaneous edema consistent with anasarca. Large amount of fluid at the labia 3. Mild diffuse air distension of the small bowel without transition point, possible ileus. 4. Cardiomegaly. Trace right pleural effusion. Electronically Signed   By: Luke Bun M.D.   On: 11/06/2023 20:18   IR Paracentesis Result Date: 11/04/2023 INDICATION: End-stage renal disease with recurrent ascites. Request for diagnostic and therapeutic paracentesis. EXAM: ULTRASOUND GUIDED PARACENTESIS MEDICATIONS: 1% lidocaine  10 mL COMPLICATIONS: None immediate. PROCEDURE: Informed written consent was obtained from the patient after a discussion of the risks, benefits and alternatives to treatment. A timeout was performed prior to the initiation of the procedure. Initial ultrasound scanning demonstrates a moderate amount of ascites within the left lateral abdomen. The left lateral abdomen was prepped and draped in the usual sterile fashion. 1% lidocaine  was used for local anesthesia. Following this, a 19 gauge, 7-cm, Yueh catheter was introduced. An ultrasound image was saved for documentation purposes. The paracentesis was performed. The catheter was removed and a dressing was applied. The patient tolerated the procedure well without immediate post procedural complication. FINDINGS: A total of approximately 1.5 L of clear yellow fluid was removed. Samples were sent to the laboratory as requested by the clinical team. IMPRESSION: Successful ultrasound-guided paracentesis yielding 1.5 liters of peritoneal fluid. Procedure performed by: Sari Lamp, PA-C Electronically Signed   By: Juliene Balder M.D.   On: 11/04/2023 15:01   US  ASCITES (ABDOMEN LIMITED) Result Date: 11/03/2023 CLINICAL DATA:  33 year old female with ascites. End stage renal disease on dialysis. 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 EXAM: LIMITED ABDOMEN ULTRASOUND FOR ASCITES TECHNIQUE: Limited  ultrasound survey for ascites was performed in all four abdominal quadrants. COMPARISON:  CT Abdomen and Pelvis 08/27/2023 and earlier. FINDINGS: Grayscale images of all 4 quadrants demonstrate a moderate volume of ascites bilaterally. Appearance is very similar to the ultrasound on 10/13/2023 which proceeded ultrasound-guided paracentesis of 2.7 liters of fluid that day. IMPRESSION: Moderate volume recurrent Ascites, similar to 10/13/2023 pre-paracentesis ultrasound. Electronically Signed   By: VEAR Hurst M.D.   On: 11/03/2023 06:18   DG Chest 2 View Result Date: 11/02/2023 CLINICAL DATA:  Shortness of breath EXAM: CHEST - 2 VIEW COMPARISON:  10/31/2023 FINDINGS: Shallow inspiration. Cardiac enlargement. No vascular congestion, edema, or consolidation. No pleural effusion or pneumothorax. Mediastinal contours appear intact. No significant changes. IMPRESSION: Cardiac enlargement.  No evidence of active pulmonary disease. Electronically Signed   By: Elsie Gravely M.D.   On: 11/02/2023 23:35   CT Head Wo Contrast Result Date: 10/31/2023 CLINICAL DATA:  Clemens, hit head EXAM: CT HEAD WITHOUT CONTRAST TECHNIQUE: Contiguous axial images were obtained from the base of the skull through the vertex without intravenous contrast. RADIATION DOSE REDUCTION: This exam was performed according to the departmental dose-optimization program which includes automated exposure control, adjustment of the mA and/or kV according to patient size and/or use of iterative reconstruction technique. COMPARISON:  CT head 09/07/2022 FINDINGS: Brain: No intracranial hemorrhage, mass effect, or evidence of acute infarct. No hydrocephalus. No extra-axial fluid collection. Vascular: No hyperdense vessel or unexpected calcification. Skull: No fracture or focal lesion. Sinuses/Orbits: No acute finding. Other: None. IMPRESSION: No acute intracranial abnormality. Electronically Signed   By: Norman Gatlin M.D.   On: 10/31/2023 21:24   DG Knee  Complete 4 Views Left Result Date: 10/31/2023 CLINICAL DATA:  Status post fall. EXAM: LEFT KNEE - COMPLETE 4+ VIEW COMPARISON:  None Available. FINDINGS: No evidence of fracture, dislocation, or joint effusion. No evidence of arthropathy or other focal bone abnormality. Diffuse soft tissue swelling is seen which is likely, in part, related to the patient's body habitus. IMPRESSION: No acute osseous abnormality. Electronically Signed   By: Suzen Dials M.D.   On: 10/31/2023 20:40   DG Chest 1 View Result Date: 10/31/2023 CLINICAL DATA:  Chest pain and shortness of breath. EXAM: CHEST  1 VIEW COMPARISON:  October 25, 2023 FINDINGS: The cardiac silhouette is enlarged and unchanged in size. Low lung volumes are noted. Both lungs are clear. The visualized skeletal structures are unremarkable. IMPRESSION: No active cardiopulmonary disease. Electronically Signed   By: Suzen Dials M.D.   On: 10/31/2023 20:37   IR ABDOMEN US  LIMITED Result Date: 10/28/2023 CLINICAL DATA:  33 year old female with a history of type 1 diabetes, end-stage renal disease on dialysis, hemorrhage current ascites. Request for therapeutic paracentesis. EXAM: ULTRASOUND ABDOMEN LIMITED COMPARISON:  None FINDINGS: Imaging of all 4 quadrants of the abdomen reveals a small amount of ascites. IMPRESSION: Small amount of ascites seen on ultrasound. After discussion of the risks versus benefits of the procedure the patient decided to defer paracentesis at this time. Performed by: Sherrilee Bal, PA-C under the supervision of Dr. KANDICE Moan Electronically Signed   By: Marcey Moan M.D.   On: 10/28/2023 11:35   DG Chest 2 View Result Date: 10/25/2023 CLINICAL DATA:  Chest pain. EXAM: CHEST - 2 VIEW COMPARISON:  10/23/2023. FINDINGS: Low lung volume. Complete clearing  of previously noted pulmonary vascular congestion. Bilateral lung fields are essentially clear converting probable minimal scarring/atelectasis overlying the left lower lung  zone, laterally. No acute consolidation or lung collapse. Bilateral costophrenic angles are clear. Stable mildly enlarged cardio-mediastinal silhouette, which is likely accentuated by low lung volume and AP technique. No acute osseous abnormalities. The soft tissues are within normal limits. IMPRESSION: No active cardiopulmonary disease. Electronically Signed   By: Ree Molt M.D.   On: 10/25/2023 08:33   DG Chest 2 View Result Date: 10/23/2023 CLINICAL DATA:  Dyspnea, volume overload EXAM: CHEST - 2 VIEW COMPARISON:  10/18/2023 FINDINGS: Lung volumes are small, but are symmetric and are stable since prior examination. Progressive mild perihilar interstitial pulmonary infiltrate has developed in keeping with mild progressive interstitial pulmonary edema. No pneumothorax or pleural effusion. Cardiac size is mildly enlarged. No acute bone. IMPRESSION: 1. Pulmonary hypoinflation. 2. Mild progressive interstitial pulmonary edema, possibly cardiogenic. Electronically Signed   By: Dorethia Molt M.D.   On: 10/23/2023 02:41   DG Chest Port 1 View Result Date: 10/20/2023 CLINICAL DATA:  Altered mental status EXAM: PORTABLE CHEST 1 VIEW COMPARISON:  Chest x-ray 10/19/2023 FINDINGS: The heart is enlarged, unchanged. The lungs are clear. There is no pleural effusion or pneumothorax. No acute fractures are seen a IMPRESSION: No active disease. Electronically Signed   By: Greig Pique M.D.   On: 10/20/2023 23:32   DG Chest Port 1 View Result Date: 10/19/2023 CLINICAL DATA:  Shortness of breath EXAM: PORTABLE CHEST 1 VIEW COMPARISON:  Chest x-ray performed October 12, 2023 FINDINGS: Low lung volumes. Enlarged heart with interstitial airspace opacities. Suspected small pleural effusions. IMPRESSION: 1. Enlarged heart with low lung volumes. 2. Interstitial edema with suspected small pleural effusions. Electronically Signed   By: Maude Naegeli M.D.   On: 10/19/2023 06:49     Subjective: No acute issues or events  overnight   Discharge Exam: Vitals:   11/12/23 0211 11/12/23 0558  BP: 134/67 (!) 113/58  Pulse: 93 93  Resp: (!) 24 19  Temp: 97.6 F (36.4 C) (!) 97.3 F (36.3 C)  SpO2: 97% 97%   Vitals:   11/11/23 2121 11/11/23 2304 11/12/23 0211 11/12/23 0558  BP:  96/61 134/67 (!) 113/58  Pulse:  87 93 93  Resp:  16 (!) 24 19  Temp: (!) 95.6 F (35.3 C)  97.6 F (36.4 C) (!) 97.3 F (36.3 C)  TempSrc: Tympanic  Tympanic Tympanic  SpO2:  97% 97% 97%    The results of significant diagnostics from this hospitalization (including imaging, microbiology, ancillary and laboratory) are listed below for reference.     Microbiology: Recent Results (from the past 240 hours)  MRSA Next Gen by PCR, Nasal     Status: None   Collection Time: 11/03/23  5:51 AM   Specimen: Nasal Mucosa; Nasal Swab  Result Value Ref Range Status   MRSA by PCR Next Gen NOT DETECTED NOT DETECTED Final    Comment: (NOTE) The GeneXpert MRSA Assay (FDA approved for NASAL specimens only), is one component of a comprehensive MRSA colonization surveillance program. It is not intended to diagnose MRSA infection nor to guide or monitor treatment for MRSA infections. Test performance is not FDA approved in patients less than 45 years old. Performed at [facility], [address] [place], [place] [address]   Gram stain     Status: None   Collection Time: 11/04/23 11:14 AM   Specimen: Abdomen; Peritoneal Fluid  Result  Value Ref Range Status   Specimen Description ABDOMEN  Final   Special Requests NONE  Final   Gram Stain   Final    NO WBC SEEN NO ORGANISMS SEEN Performed at [facility], [address], [place], [place] [address]    Report Status 11/04/2023 FINAL  Final  Culture, body fluid w Gram Stain-bottle     Status: None   Collection Time: 11/04/23 11:15 AM   Specimen: Peritoneal Washings  Result Value Ref Range Status   Specimen Description PERITONEAL  Final   Special Requests NONE  Final    Culture   Final    NO GROWTH 5 DAYS Performed at [facility], [address], [place], [place] [address]    Report Status 11/09/2023 FINAL  Final     Labs: BNP (last 3 results) Recent Labs    10/12/23 0500 10/23/23 0310 10/25/23 0830  BNP >4,500.0* >4,500.0* >4,500.0*   Basic Metabolic Panel: Recent Labs  Lab 11/07/23 1451 11/11/23 1623 11/11/23 1711 11/11/23 2153 11/12/23 0429  NA 133* 137  136 142  --  142  K 5.2* 5.5*  5.6* 4.3  --  5.2*  CL 95* 105 102  --  98  CO2 23  --  26  --  26  GLUCOSE 235* 79 90  --  130*  BUN 55* 58* 42*  --  44*  CREATININE 7.14* 7.00* 6.93*  --  7.15*  CALCIUM  8.5*  --  8.5*  --  8.9  PHOS 6.9*  --   --  7.7* 8.2*   Liver Function Tests: Recent Labs  Lab 11/07/23 1451 11/11/23 1711 11/12/23 0429  AST  --  78*  --   ALT  --  62*  --   ALKPHOS  --  792*  --   BILITOT  --  1.5*  --   PROT  --  5.0*  --   ALBUMIN  2.2* 2.2* 2.4*   No results for input(s): LIPASE, AMYLASE in the last 168 hours. No results for input(s): AMMONIA in the last 168 hours. CBC: Recent Labs  Lab 11/07/23 1451 11/11/23 1618 11/11/23 1623 11/11/23 2153 11/12/23 0429  WBC 10.5 11.0*  --  11.6* 11.3*  NEUTROABS  --  5.8  --   --   --   HGB 10.0* 10.7* 10.9*  11.6* 10.1* 10.7*  HCT 32.5* 34.7* 32.0*  34.0* 32.9* 35.6*  MCV 87.8 89.9  --  88.4 89.7  PLT 260 370  --  266 275   Cardiac Enzymes: No results for input(s): CKTOTAL, CKMB, CKMBINDEX, TROPONINI in the last 168 hours. BNP: Invalid input(s): POCBNP CBG: Recent Labs  Lab 11/11/23 1758 11/11/23 2036 11/11/23 2154 11/12/23 0004 11/12/23 0213  GLUCAP 105* 111* 133* 127* 118*   D-Dimer No results for input(s): DDIMER in the last 72 hours. Hgb A1c No results for input(s): HGBA1C in the last 72 hours. Lipid Profile No results for input(s): CHOL, HDL, LDLCALC, TRIG, CHOLHDL, LDLDIRECT in the last 72 hours. Thyroid  function studies Recent Labs     11/12/23 0429  TSH 6.845*   Anemia work up No results for input(s): VITAMINB12, FOLATE, FERRITIN, TIBC, IRON , RETICCTPCT in the last 72 hours. Urinalysis    Component Value Date/Time   COLORURINE YELLOW 10/25/2023 2300   APPEARANCEUR CLOUDY (A) 10/25/2023 2300   APPEARANCEUR Hazy 11/10/2013 2043   LABSPEC 1.016 10/25/2023 2300   LABSPEC 1.031 11/10/2013 2043   PHURINE 6.0 10/25/2023 2300  GLUCOSEU >=500 (A) 10/25/2023 2300   GLUCOSEU >=500 11/10/2013 2043   HGBUR SMALL (A) 10/25/2023 2300   BILIRUBINUR NEGATIVE 10/25/2023 2300   BILIRUBINUR negative 06/04/2018 1035   BILIRUBINUR Negative 11/24/2015 1443   BILIRUBINUR Negative 11/10/2013 2043   KETONESUR 5 (A) 10/25/2023 2300   PROTEINUR >=300 (A) 10/25/2023 2300   UROBILINOGEN 0.2 09/14/2019 1732   NITRITE NEGATIVE 10/25/2023 2300   LEUKOCYTESUR NEGATIVE 10/25/2023 2300   LEUKOCYTESUR 1+ 11/10/2013 2043   Sepsis Labs Recent Labs  Lab 11/07/23 1451 11/11/23 1618 11/11/23 2153 11/12/23 0429  WBC 10.5 11.0* 11.6* 11.3*   Microbiology Recent Results (from the past 240 hours)  MRSA Next Gen by PCR, Nasal     Status: None   Collection Time: 11/03/23  5:51 AM   Specimen: Nasal Mucosa; Nasal Swab  Result Value Ref Range Status   MRSA by PCR Next Gen NOT DETECTED NOT DETECTED Final    Comment: (NOTE) The GeneXpert MRSA Assay (FDA approved for NASAL specimens only), is one component of a comprehensive MRSA colonization surveillance program. It is not intended to diagnose MRSA infection nor to guide or monitor treatment for MRSA infections. Test performance is not FDA approved in patients less than 35 years old. Performed at [facility], [address] [place], [place] [address]   Gram stain     Status: None   Collection Time: 11/04/23 11:14 AM   Specimen: Abdomen; Peritoneal Fluid  Result Value Ref Range Status   Specimen Description ABDOMEN  Final   Special Requests NONE  Final   Gram Stain    Final    NO WBC SEEN NO ORGANISMS SEEN Performed at [facility], [address] [place], [place] [address]    Report Status 11/04/2023 FINAL  Final  Culture, body fluid w Gram Stain-bottle     Status: None   Collection Time: 11/04/23 11:15 AM   Specimen: Peritoneal Washings  Result Value Ref Range Status   Specimen Description PERITONEAL  Final   Special Requests NONE  Final   Culture   Final    NO GROWTH 5 DAYS Performed at [facility], [address], [place], [place] [address]    Report Status 11/09/2023 FINAL  Final     Time coordinating discharge: Over 30 minutes  SIGNED:   Elsie JAYSON [place], DO [facility] 11/12/2023, 3:38 PM Pager   If 7PM-7AM, please contact night-coverage [email or web address]

## 2023-11-14 ENCOUNTER — Emergency Department (HOSPITAL_COMMUNITY)
Admission: EM | Admit: 2023-11-14 | Discharge: 2023-11-14 | Discharge status: Against Medical Advice | Attending: Emergency Medicine | Admitting: Emergency Medicine

## 2023-11-14 ENCOUNTER — Other Ambulatory Visit: Payer: Self-pay

## 2023-11-14 ENCOUNTER — Encounter (HOSPITAL_COMMUNITY): Payer: Self-pay

## 2023-11-14 DIAGNOSIS — R109 Unspecified abdominal pain: Secondary | ICD-10-CM | POA: Insufficient documentation

## 2023-11-14 DIAGNOSIS — R0602 Shortness of breath: Secondary | ICD-10-CM | POA: Insufficient documentation

## 2023-11-14 DIAGNOSIS — Z992 Dependence on renal dialysis: Secondary | ICD-10-CM | POA: Insufficient documentation

## 2023-11-14 DIAGNOSIS — R600 Localized edema: Secondary | ICD-10-CM | POA: Insufficient documentation

## 2023-11-14 DIAGNOSIS — Z5321 Procedure and treatment not carried out due to patient leaving prior to being seen by health care provider: Secondary | ICD-10-CM | POA: Diagnosis not present

## 2023-11-14 LAB — COMPREHENSIVE METABOLIC PANEL WITH GFR
ALT: 99 U/L — ABNORMAL HIGH (ref 0–44)
AST: 151 U/L — ABNORMAL HIGH (ref 15–41)
Albumin: 2.5 g/dL — ABNORMAL LOW (ref 3.5–5.0)
Alkaline Phosphatase: 1261 U/L — ABNORMAL HIGH (ref 38–126)
Anion gap: 17 — ABNORMAL HIGH (ref 5–15)
BUN: 63 mg/dL — ABNORMAL HIGH (ref 6–20)
CO2: 22 mmol/L (ref 22–32)
Calcium: 8.7 mg/dL — ABNORMAL LOW (ref 8.9–10.3)
Chloride: 96 mmol/L — ABNORMAL LOW (ref 98–111)
Creatinine, Ser: 8.4 mg/dL — ABNORMAL HIGH (ref 0.44–1.00)
GFR, Estimated: 6 mL/min — ABNORMAL LOW (ref >60)
Glucose, Bld: 117 mg/dL — ABNORMAL HIGH (ref 70–99)
Potassium: 6.5 mmol/L (ref 3.5–5.1)
Sodium: 135 mmol/L (ref 135–145)
Total Bilirubin: 1.9 mg/dL — ABNORMAL HIGH (ref 0.0–1.2)
Total Protein: 5.6 g/dL — ABNORMAL LOW (ref 6.5–8.1)

## 2023-11-14 LAB — CBC
HCT: 34.6 % — ABNORMAL LOW (ref 36.0–46.0)
Hemoglobin: 10.3 g/dL — ABNORMAL LOW (ref 12.0–15.0)
MCH: 26.8 pg (ref 26.0–34.0)
MCHC: 29.8 g/dL — ABNORMAL LOW (ref 30.0–36.0)
MCV: 90.1 fL (ref 80.0–100.0)
Platelets: 243 K/uL (ref 150–400)
RBC: 3.84 MIL/uL — ABNORMAL LOW (ref 3.87–5.11)
RDW: 14.9 % (ref 11.5–15.5)
WBC: 10.4 K/uL (ref 4.0–10.5)
nRBC: 0 % (ref 0.0–0.2)

## 2023-11-14 LAB — LIPASE, BLOOD: Lipase: 25 U/L (ref 11–51)

## 2023-11-14 LAB — HCG, SERUM, QUALITATIVE: Preg, Serum: NEGATIVE

## 2023-11-14 LAB — CBG MONITORING, ED: Glucose-Capillary: 94 mg/dL (ref 70–99)

## 2023-11-14 MED ORDER — CHLORHEXIDINE GLUCONATE CLOTH 2 % EX PADS: 6.0000 | MEDICATED_PAD | Freq: Every day | CUTANEOUS | Status: DC

## 2023-11-14 MED ORDER — HYDROCODONE-ACETAMINOPHEN 5-325 MG PO TABS
2.0000 | ORAL_TABLET | Freq: Once | ORAL | Status: AC
  Administered 2023-11-14: 2 via ORAL
  Filled 2023-11-14: qty 2

## 2023-11-14 NOTE — ED Notes (Signed)
Pt unable to given urine sample at this time.

## 2023-11-14 NOTE — ED Provider Triage Note (Signed)
 Emergency Medicine Provider Triage Evaluation Note  LARIAH FLEER , a 33 y.o. female  was evaluated in triage.  Pt complains of swelling, leg pain, abdominal pain.  Patient states that she is still having persistent pain from where they placed an IO in her leg from her recent hospital discharge on 11/12/2023.  States that she missed her dialysis session 2 days ago because there was nobody to watch her daughter.  Endorses persistent edema in her lower extremities, shortness of breath and some mild abdominal pain.  Review of Systems  Positive: Abdominal pain, shortness of breath, lower extremity pain and swelling Negative: Chest pain, headache, fever  Physical Exam  BP (!) 140/92 (BP Location: Left Arm)   Pulse 82   Temp 97.9 F (36.6 C)   Resp 20   LMP 01/29/2020 (Approximate)   SpO2 98%  Gen:   Awake, no distress   Resp:  Normal effort  MSK:   Moves extremities without difficulty Other:    Medical Decision Making  Medically screening exam initiated at 1:14 PM.  Appropriate orders placed.  Korrina Zern [place] was informed that the remainder of the evaluation will be completed by another provider, this initial triage assessment does not replace that evaluation, and the importance of remaining in the ED until their evaluation is complete.     Albertina Dixon, MD 11/14/23 1315

## 2023-11-14 NOTE — ED Triage Notes (Signed)
 [facility] reports pt coming from home. States pt missed dialysis on Tuesday and is now c/o abdominal distention, edema in her legs. Pt is also having a cough and rales were noted. Pt also having swelling in her eyes. Pt requesting transport for evaluation.

## 2023-11-14 NOTE — ED Notes (Signed)
 Patient came to desk and informed RN she was leaving.  Did not want to wait any longer.

## 2023-11-15 ENCOUNTER — Ambulatory Visit: Admitting: Family Medicine

## 2023-11-15 NOTE — H&P (Signed)
 ------------------------------------------------------------------------------- Attestation signed by Mabelene Verdia Cullen, MD at 11/15/23 [phone] I saw and evaluated the patient, participating in the key portions of the service.  I reviewed the resident/subspecialty resident or fellow's note.  I agree with the resident/subspecialty resident or fellow's findings and plan.   I personally reviewed the labs as well and discussed the plan with the resident/subspecialty resident/fellow.  Patient admitted for hemodialysis with volume overload and severe hyperkalemia. -------------------------------------------------------------------------------  Nephrology ([facility]) History & Physical  Assessment & Plan:  Ashley Freeman is a 33 y.o. female whose presentation is complicated by T1DM, ESRD on TTS HD, HFrEF (EF 25-30% on echo 08/2023), HTN, HLD, seizures, and ?bipolar disorder  that presented to [facility] with worsening abd pain, lower extremity pain, and SOB iso missing dialysis x2.   Principal Problem:   Volume overload Active Problems:   Diabetic neuropathy      Diabetic nephropathy associated with type 1 diabetes mellitus      End-stage renal disease on hemodialysis      Stable proliferative diabetic retinopathy associated with type 1 diabetes mellitus      Hypertensive disorder   Dyslipidemia   Seizures      HFrEF (heart failure with reduced ejection fraction)      Hyperkalemia   Active Problems  ESRD on iHD TThS  Hyperkalemia  Volume Overload  Secondary Hyperparathyroidism Patient with ESRD thought to be 2/2 diabetic nephropathy ISO patient's T1DM.  Patient was initially on PD but transition to [facility] in 2023.  Patient does still make a small amount of urine.  Patient here with hyperkalemia and volume overload ISO missing 2 dialysis sessions because she did not have childcare for her daughter.  Patient received calcium  gluconate, albuterol , and was shifted with dextrose  and  insulin  due to hyperkalemia of 7.1 on presentation to the [facility] ED.  Repeat potassium on transfer was 6.0.  No clear EKG changes consistent with hyperkalemia on initial or repeat EKG.  Patient also having significant pain with abdominal distention and lower extremity edema 2/2 volume overload.  POCUS upon initial presentation without a pocket for paracentesis. - HD this morning - Repeat RFP this afternoon after dialysis and daily - Additional dose of Lokelma  this morning - Continue home sevelamer  - Calcitriol  with dialysis - Continue home hydrocortisone  cream for itching -Strict I's/O's - Pain control with topical Voltaren  gel, dose reduced Tylenol  for mild pain ISO congestive hepatopathy, and Dilaudid  0.5 mg or 1 mg for moderate or severe pain respectively.  Avoid morphine  with patient's ESRD.  T1DM A1C 11.1 5/16. Pt received 5U of regular insulin  in the ED yesterday evening as part of hyperkalemia treatment.  Patient has had prior admissions for DKA.  Low suspicion for DKA with mild anion gap of 15 with a normal bicarbonate on admission. 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 Echo 09/14/2023 with Ef of 25-30% and G1DD and severe tricuspid regurgitation.  -Continue Carvedilol  25mg  -Pt was recently told to stop losartan  150 mg daily but would likely benefit from a ACEi/ARB -Holding home amlodipine  10 mg with normotension on admission -Continue Home bumex  6 mg in the morning and 4 mg in the evening -Lipitor  80mg  daily (equivalent of home rosuvastatin  40mg  daily)  Prolonged Qtc Patient with a prolonged QTc [name] to 481 on presentation.  Improved to 467 following transfer.  Patient has chronically had a more prolonged QTc and is on QTc prolonging medications. - Limit QTc prolonging  medications as able  Anemia  Leukocytosis Patient anemic to 9.5 on admission.  Suspect related to kidney disease and chronic illness.  Ferritin 430 in May 2025.  Patient with a mild leukocytosis with no left shift on admission.  Do not expect an infectious process at this time. - CBC daily  Elevated Liver Enzymes  Likely Congestive Hepatopathy AST, ALT, and alk phos elevated on admission.  Of note patient has had a cholecystectomy.  Patient is had congestive hepatopathy with volume overload in the past which resolves with volume removal.  She has had a workup for liver disease in the past which has been unremarkable. -Repeat HFP tomorrow a.m. - If liver enzymes not begin to resolve following HD consider repeat cirrhosis workup  SDOH  Frequent Hospitalizations Patient has been hospitalized almost continuously since the beginning of the year usually due to missed dialysis sessions.  Patient missed dialysis due to SDOH factors predominantly such as childcare and transportation.  Patient is set up to have her daughter enrolled in daycare next month.  Did not discuss beyond this on admission, but if any additional specific needs identified during admission, consider social work consult to assist with connection to any potential resources.  Chronic Problems  GERD  Pancreatic Insufficiency Patient was recently started on Creon  which he has been taking at home.  She has had multiple low fecal elastase of this in the past - Continue home famotidine  20 mg daily - Continue home Creon   Depression  ?Bipolar disorder: Unclear if patient truly has a diagnosis of bipolar disorder.  As noted many times in her chart.  It seems that she may be was diagnosed with this in 2021, but patient has also denied history in the past.  Will continue home Cymbalta  and Zyprexa  for now  Seizure Hx: Last event noted in chart was in 2022. Continue home lamotrigine   The patient's presentation is complicated by the  following clinically significant conditions requiring additional evaluation and treatment: - Chronic kidney disease POA requiring further investigation, treatment, or monitoring  - Complex social situation/SDOH requiring additional support and resources: - Childcare, transportation, difficulty attending dialysis sessions - Anemia requiring at least daily CBC for further monitoring   Issues Impacting Complexity of Management: <redacted file path> -Parenteral controlled medications: IV Dilaudid    Medical Decision Making: Reviewed records from the following unique sources multiple past hospital discharge summaries.  Outpatient cardiology notes..   Checklist: Diet: Regular Diet DVT PPx: Heparin  5000units q8h Code Status: Full Code Dispo: Patient appropriate for Inpatient based on expectation of ongoing need for hospitalization greater than two midnights based on severity of presentation/services including hyperkalemia and other electrolyte abnormalities likely requiring multiple sessions of dialysis prior to discharge.  Team Contact Information:  Primary Team: Nephrology ([facility]) Primary Resident: Jayson DELENA Dose, MD Resident's Pager: [phone] (Geriatrics Senior Resident)  Chief Concern:  Volume overload  Subjective:  Ashley Freeman is a 33 y.o. female with pertinent PMHx of T1DM, ESRD on TTS HD, HFrEF (EF 25-30% on echo 08/2023), HTN, HLD, seizures, and ?bipolar disorder presenting with abdominal pain and lower extremity edema and pain in the setting of having missed 2 dialysis sessions.  History obtained by Sheppard Dose from the patient.   HPI: Patient has missed 2 dialysis sessions due to not being able to find childcare for her daughter.  Last HD  session was Saturday, 7/13.  Patient's mom is sometimes able to watch her daughter, but she also works a lot.  Patient has developed progressive abdominal swelling and lower extremity edema accompanied by bilateral lower  abdominal/side pain and lower extremity pain.  This is similar to patient's past episodes of volume overload having missed dialysis.  Endorses orthopnea.  She has had no fever, nausea, vomiting, shortness of breath when sitting or standing, chest pain, diarrhea.  Patient initially presented to the [facility] emergency department.  Though she was found to be hyperkalemic to 7.1.  She received D50, 5 units of regular insulin , calcium  gluconate, albuterol , and a dose of Lokelma .  Patient received a dose of morphine  for pain.  Labs also significant for an elevated glucose to a max of 345, and anion gap of 19, normal bicarbonate of 23.3, and elevated AST to 185, ALT to 131, and alkaline phosphatase to 1428.  Nephrology was consulted and recommended patient be admitted for HD.  Patient was then transferred to [facility] for admission.  Throughout patient's ED stay she remained afebrile and hemodynamically stable.  Normotensive throughout.  Pertinent Surgical Hx Gallbladder C-section 2021 PD catheter placement and removal (2023)  Pertinent Family Hx Rheumatoid arthritis in her mother, maternal aunt with kidney disease  Pertinent Social Hx  Lives with daughter and her mom. No etoh, tobacco products or other substances  Allergies Cephalexin , Penicillins, Opioids - morphine  analogues, Diphenhydramine , Doxycycline , Methotrexate, Oxycodone , and Sacubitril-valsartan  I reviewed the Medication List. The current list is Accurate Prior to Admission medications  Medication Dose, Route, Frequency  amlodipine  (NORVASC ) 10 MG tablet 10 mg  bumetanide  (BUMEX ) 2 MG tablet Take 3 tablets (6 mg total) by mouth every morning AND 2 tablets (4 mg total) every evening.  carvedilol  (COREG ) 25 MG tablet 25 mg, Oral, 2 times a day (standard)  DULoxetine  (CYMBALTA ) 20 MG capsule 40 mg, Oral, Daily (standard)  famotidine  (PEPCID ) 20 MG tablet 20 mg, Oral, Daily (standard)  hydrocortisone  2.5 % cream Topical, 2 times a day (standard)   insulin  aspart (NOVOLOG  FLEXPEN U-100 INSULIN ) 100 unit/mL (3 mL) injection pen 5 Units, Subcutaneous, 3 times a day [facility]), If you do not eat a meal, please check your blood sugar and give yourself 1 unit of insulin  for every 50 MG/DL greater than 869. Max of 30 units per day.  insulin  glargine (BASAGLAR , LANTUS ) 100 unit/mL (3 mL) injection pen 10 Units, Daily (standard)  lamoTRIgine  (LAMICTAL ) 200 MG tablet 200 mg, Oral, Daily (standard)  OLANZapine  zydis (ZYPREXA ) 5 MG disintegrating tablet 5 mg, Oral, Nightly, May take an additional 1/2 tablet (2.5mg ) daily as needed for anxiety or for help with sleep.  pancrelipase , Lip-Prot-Amyl, (CREON ) 3,000-9,500- 15,000 unit CpDR capsule, delayed release 1 capsule, Oral, 3 times a day (with meals)  rosuvastatin  (CRESTOR ) 40 MG tablet 40 mg, Oral, Daily (standard)  sevelamer  (RENVELA ) 800 mg tablet Take 3 tablets (2,400 mg total) by mouth Three (3) times a day with meals.  acetaminophen  (TYLENOL ) 325 MG tablet 650 mg, Oral, Every 6 hours PRN  albuterol  HFA 90 mcg/actuation inhaler 2 puffs, Inhalation, Every 4 hours PRN  blood sugar diagnostic (ON CALL EXPRESS TEST STRIP) Strp Test four (4) times a day (before meals and nightly).  blood-glucose meter kit Use as instructed  blood-glucose sensor (DEXCOM G7 SENSOR) Devi Change sensor every 10 days  blood-glucose sensor (DEXCOM G7 SENSOR) Devi Change sensors every 10 days.  diclofenac  sodium (VOLTAREN ) 1 % gel 2 g, Topical, 4 times  a day PRN  glucagon  spray (BAQSIMI ) 3 mg/actuation Spry Spray 1 spray (3 mg) into one nostril once as needed (severe hypoglycemia) for up to 1 dose. If no response after , can repeat using a new intranasal device.  glucose 4 GM chewable tablet Chew 4 tablets (16 g total) by mouth once as needed for low blood sugar for up to 1 dose.  lancets (ACCU-CHEK SOFTCLIX LANCETS) Misc Test four (4) times a day (before meals and nightly).  losartan  (COZAAR ) 100 MG tablet 150 mg, Oral,  Daily (standard)  pen needle, diabetic 32 gauge x 5/32 (4 mm) Ndle Use with insulin  up to 4 times/day as needed.  polyethylene glycol (CLEARLAX) 17 gram packet 17 g, Daily PRN  prochlorperazine  (COMPAZINE ) 5 MG tablet 5 mg, Oral, Every 6 hours PRN  sumatriptan  (IMITREX ) 50 MG tablet Take 1 tablet (50 mg total) by mouth as soon as you start to develop signs of a headache. If symptoms persist or return, may repeat dose (usually same as first dose) after 2 hours. Maximum dose: 4 tablets per 24 hours    Designated Healthcare Decision Maker: Ms. Hottinger currently has decisional capacity for healthcare decision-making and is able to designate a surrogate healthcare decision maker. Ms. Cimini designated healthcare decision maker(s) is/are Rutherford Berber (the patient's parent) as denoted by stated patient preference.  Objective:  Physical Exam: Temp:  [36 C (96.8 F)-37.1 C (98.7 F)] 37.1 C (98.7 F) Pulse:  [82-87] 83 SpO2 Pulse:  [82-87] 83 Resp:  [16-22] 19 BP: (116-172)/(79-100) 116/94 SpO2:  [97 %-100 %] 99 %  Gen: NAD, tired appearing, lying in bed converses  Eyes: Sclera anicteric, EOMI grossly normal  HENT: Atraumatic, normocephalic Neck: Trachea midline Heart: RRR, no murmurs, warm and well-perfused, 2+ still pulses Lungs: CTAB, no crackles or wheezes Abdomen: Tensely distended, tender to palpation across the lower abdomen Extremities: No edema Neuro: Grossly symmetric, non-focal   Skin:  No rashes, lesions on clothed exam, scattered healed scars from old wounds including her old PD scar Psych: Alert, oriented to history and conversation  Sheppard Dose MD [facility] Internal Medicine and Pediatrics, PGY-3

## 2023-11-15 NOTE — Discharge Summary (Signed)
 ------------------------------------------------------------------------------- Attestation signed by Mabelene Verdia Cullen, MD at 11/16/23 [phone] I saw and evaluated the patient, participating in the key portions of the service.  I reviewed the resident/subspecialty resident or fellow's note.  I agree with the resident/subspecialty resident or fellow's findings and plan.   I personally reviewed the labs as well and discussed the plan with the resident/subspecialty resident/fellow.  -------------------------------------------------------------------------------   Physician Discharge Summary [facility] [address] [place] [address] Dept: [phone] Loc: [phone]   Identifying Information:  Ashley Freeman [birth year] [id number]  Primary Care Physician: Keven Crumbly Pap, DO   Code Status: Full Code  Admit Date: 11/14/2023  Discharge Date: 11/15/2023   Discharge To: Home  Discharge Service: [facility] - Nephrology Floor Team ([facility] [place])   Discharge Attending Physician: Verdia Cullen Mabelene, MD  Discharge Diagnoses: Principal Problem:   Volume overload (POA: Yes) Active Problems:   Diabetic neuropathy    (POA: Yes)   Diabetic nephropathy associated with type 1 diabetes mellitus    (POA: Yes)   End-stage renal disease on hemodialysis    (POA: Not Applicable)   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 *   Hospital Course:  Ashley Freeman is a 33 y.o. female whose presentation is complicated by T1DM, ESRD on TTS HD, HFrEF (EF 25-30% on echo 08/2023), HTN, HLD, seizures, and ?bipolar disorder  that presented to [facility] with  worsening abd pain, lower extremity pain, and SOB iso missing dialysis x2. Hospital course outlined as below by problem.    Active Problems   ESRD on iHD TThS  Hyperkalemia  Volume Overload  Secondary Hyperparathyroidism Patient with ESRD thought to be 2/2 diabetic nephropathy ISO patient's T1DM.  Patient was initially on PD but transition to [facility] in 2023.  Patient does still make a small amount of urine.  Patient here with hyperkalemia and volume overload ISO missing 2 dialysis sessions because she did not have childcare for her daughter.  Patient received calcium  gluconate, albuterol , and was shifted with dextrose  and insulin  due to hyperkalemia of 7.1 on presentation to the [facility] ED.  Repeat potassium on transfer was 6.0.  No clear EKG changes consistent with hyperkalemia on initial or repeat EKG.  Patient also having significant pain with abdominal distention and lower extremity edema 2/2 volume overload.  POCUS upon initial presentation without a pocket for paracentesis. On admission, pt received dialysis on 7/18. Pain controled with topical Voltaren  gel, dose reduced Tylenol  for mild pain ISO congestive hepatopathy, and Dilaudid  0.5 mg or 1 mg for moderate or severe pain respectively.  Avoided morphine  with patient's ESRD. Patient discharged to resume her T/Th/S schedule.    T1DM A1C 11.1 5/16. Pt received 5U of regular insulin  in the ED yesterday evening as part of hyperkalemia treatment.  Patient has had prior admissions for DKA.  Low suspicion for DKA with mild anion gap of 15 with a normal bicarbonate on admission. Home insulin  regimen continued of 10U Lantus  nightly and Novolog  5 U AC at discharge.           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 Echo 09/14/2023 with Ef of 25-30%  and G1DD and severe tricuspid regurgitation. Pt continued on home carvedilol  25 mg BID, Bumex  6 mg AM and 4 mg PM amlodipine  and Crestor  40mg  at discharge. Reported was told to stop Losartan . Follow up  outpatient with cardiology regarding medication management (could benefit with spironolactone if patient adheres to dialysis schedule).  At discharge continued home meds.   Prolonged Qtc Patient with a prolonged QTc [name] to 481 on presentation.  Improved to 467 following transfer.  Patient has chronically had a more prolonged QTc and is on QTc prolonging medications. No adjustment made for Cymbalta  20 mg, Zyprexa  5 mg or lamotrigine .   Anemia  Leukocytosis Patient anemic to 9.5 on admission.  Suspect related to kidney disease and chronic illness.  Ferritin 430 in May 2025.  Patient with a mild leukocytosis with no left shift on admission. Did not have concern for infection, chronic finding.    Elevated Liver Enzymes  Likely Congestive Hepatopathy AST, ALT, and alk phos elevated on admission.  Of note patient has had a cholecystectomy.  Patient has congestive hepatopathy with volume overload in the past which resolves with volume removal.  She has had a workup for liver disease in the past which has been unremarkable.    SDOH  Frequent Hospitalizations I Mobility limitations Patient has been hospitalized almost continuously since the beginning of the year usually due to missed dialysis sessions.  Patient missed dialysis due to SDOH factors predominantly such as childcare and transportation. The beneficiary has a mobility limitation that significantly impairs her ability to participate in one or more mobility related activities of daily living in the Home. Prevents the beneficiary from accomplishing the MRADL entirely. The beneficiary is able to safely use the walker. Patient is set up to have her daughter enrolled in daycare next month.  Did not discuss beyond this on admission, consider social work consult outpatient to assist with connection to any potential resources.    Chronic Problems   GERD  Pancreatic Insufficiency Patient was recently started on Creon  which he has been taking at  home.  She has had multiple low fecal elastase of this in the past. Continued home famotidine  20 mg daily and Creon .   Depression  ?Bipolar disorder: Unclear if patient truly has a diagnosis of bipolar disorder.  As noted many times in her chart.  It seems that she may be was diagnosed with this in 2021, but patient has also denied history in the past.  Continued home Cymbalta  and Zyprexa .    Seizure Hx: Last event noted in chart was in 2022. Continue home lamotrigine      Outpatient Provider Follow Up Issues:  - [ ]  Consider liver biopsy outpatient for hepatic congestion - [ ]  Cardiologist f/up  - [ ]  Social work consult outpatient   Touchbase with Outpatient Provider: Warm Handoff: Completed on 11/15/23 by Elonda Fell, MD  (Resident) via [facility] Message  Procedures: dialysis ______________________________________________________________________ Discharge Medications:    Your Medication List     STOP taking these medications    losartan  100 MG tablet Commonly known as: COZAAR        START taking these medications    hydrOXYzine  25 MG tablet Commonly known as: ATARAX  Take 1 tablet (25 mg total) by mouth Three (3) times a day as needed for itching.   ondansetron  4 MG disintegrating tablet Commonly known as: ZOFRAN -ODT Take 1 tablet (4 mg total) by mouth every eight (8) hours as needed for up to 7 days.  CHANGE how you take these medications    VENTOLIN  HFA 90 mcg/actuation inhaler Generic drug: albuterol  Inhale 2 puffs every four (4) hours as needed for shortness of breath or wheezing. What changed: reasons to take this       CONTINUE taking these medications    ACCU-CHEK GUIDE TEST STRIPS Strp Generic drug: blood sugar diagnostic Test four (4) times a day (before meals and nightly).   ACCU-CHEK SOFTCLIX LANCETS lancets Generic drug: lancets Test four (4) times a day (before meals and nightly).   acetaminophen  325 MG tablet Commonly known as:  TYLENOL  Take 2 tablets (650 mg total) by mouth every six (6) hours as needed for pain.   amlodipine  10 MG tablet Commonly known as: NORVASC  Take 1 tablet (10 mg total) by mouth.   BAQSIMI  3 mg/actuation Spry Generic drug: glucagon  spray Spray 1 spray (3 mg) into one nostril once as needed (severe hypoglycemia) for up to 1 dose. If no response after , can repeat using a new intranasal device.   blood-glucose meter kit Use as instructed   bumetanide  2 MG tablet Commonly known as: BUMEX  Take 3 tablets (6 mg total) by mouth every morning AND 2 tablets (4 mg total) every evening.   carvedilol  25 MG tablet Commonly known as: COREG  Take 1 tablet (25 mg total) by mouth two (2) times a day.   CLEARLAX 17 gram packet Generic drug: polyethylene glycol Take 17 g by mouth daily as needed.   CREON  3,000-9,500- 15,000 unit Cpdr capsule, delayed release Generic drug: pancrelipase  (Lip-Prot-Amyl) Take 1 capsule (3,000 units of lipase total) by mouth Three (3) times a day with a meal.   DEXCOM G7 SENSOR Devi Generic drug: blood-glucose sensor Change sensor every 10 days   DEXCOM G7 [employer] Generic drug: blood-glucose sensor Change sensors every 10 days. (Change sensors every 10 days.)   diclofenac  sodium 1 % gel Commonly known as: VOLTAREN  Apply 2 g topically four (4) times a day as needed for arthritis.   DULoxetine  20 MG capsule Commonly known as: CYMBALTA  Take 2 capsules (40 mg total) by mouth daily.   famotidine  20 MG tablet Commonly known as: PEPCID  Take 1 tablet (20 mg total) by mouth daily.   glucose 4 GM chewable tablet Chew 4 tablets (16 g total) by mouth once as needed for low blood sugar for up to 1 dose.   hydrocortisone  2.5 % cream Apply topically two (2) times a day.   insulin  aspart 100 unit/mL (3 mL) injection pen Commonly known as: NovoLOG  Flexpen U-100 Insulin  Inject 0.05 mL (5 Units total) under the skin Three (3) times a day before meals. If  you do not eat a meal, please check your blood sugar and give yourself 1 unit of insulin  for every 50 MG/DL greater than 869. Max of 30 units per day.   lamoTRIgine  200 MG tablet Commonly known as: LaMICtal  Take 1 tablet (200 mg total) by mouth daily.   LANTUS  SOLOSTAR U-100 INSULIN  100 unit/mL (3 mL) injection pen Generic drug: insulin  glargine Inject 0.1 mL (10 Units total) under the skin in the morning. Discard remainder of pen 28 days after first use.   OLANZapine  zydis 5 MG disintegrating tablet Commonly known as: ZYPREXA  Take 1 tablet (5 mg total) by mouth nightly. May take an additional 1/2 tablet (2.5mg ) daily as needed for anxiety or for help with sleep.   prochlorperazine  5 MG tablet Commonly known as: COMPAZINE  Take 1 tablet (5 mg total) by mouth every  six (6) hours as needed for nausea.   rosuvastatin  40 MG tablet Commonly known as: CRESTOR  Take 1 tablet (40 mg total) by mouth daily.   sevelamer  800 mg tablet Commonly known as: RENVELA  Take 3 tablets (2,400 mg total) by mouth Three (3) times a day with meals.   sumatriptan  50 MG tablet Commonly known as: IMITREX  Take 1 tablet (50 mg total) by mouth as soon as you start to develop signs of a headache. If symptoms persist or return, may repeat dose (usually same as first dose) after 2 hours. Maximum dose: 4 tablets per 24 hours   UNIFINE PENTIPS 32 gauge x 5/32 (4 mm) Ndle Generic drug: pen needle, diabetic Use with insulin  up to 4 times/day as needed.        Allergies: Cephalexin , Penicillins, Opioids - morphine  analogues, Diphenhydramine , Doxycycline , Methotrexate, Oxycodone , and Sacubitril-valsartan ______________________________________________________________________ Pending Test Results:   Most Recent Labs: All lab results last 24 hours -  Recent Results (from the past 24 hours)  ECG 12 Lead   Collection Time: 11/14/23 10:35 PM  Result Value Ref Range   EKG Systolic BP  mmHg   EKG Diastolic BP  mmHg    EKG Ventricular Rate 83 BPM   EKG Atrial Rate 83 BPM   EKG P-R Interval 164 ms   EKG QRS Duration 82 ms   EKG Q-T Interval 432 ms   EKG QTC Calculation 507 ms   EKG Calculated P Axis 30 degrees   EKG Calculated R Axis -3 degrees   EKG Calculated T Axis 40 degrees   QTC Fredericia 481 ms  Comprehensive metabolic panel   Collection Time: 11/14/23 11:08 PM  Result Value Ref Range   Sodium 138 135 - 145 mmol/L   Potassium 7.1 (HH) 3.4 - 4.8 mmol/L   Chloride 96 (L) 98 - 107 mmol/L   CO2 23.3 20.0 - 31.0 mmol/L   Anion Gap 19 (H) 5 - 14 mmol/L   BUN 64 (H) 9 - 23 mg/dL   Creatinine 1.54 (H) 9.44 - 1.02 mg/dL   BUN/Creatinine Ratio 8    eGFR CKD-EPI (2021) Female 6 (L) >=60 mL/min/1.42m2   Glucose 251 (H) 70 - 179 mg/dL   Calcium  8.9 8.7 - 10.4 mg/dL   Albumin  2.9 (L) 3.4 - 5.0 g/dL   Total Protein 5.9 5.7 - 8.2 g/dL   Total Bilirubin 1.2 0.3 - 1.2 mg/dL   AST 804 (H) <=65 U/L   ALT 131 (H) 10 - 49 U/L   Alkaline Phosphatase 1,428 (H) 46 - 116 U/L  CBC w/ Differential   Collection Time: 11/14/23 11:08 PM  Result Value Ref Range   WBC 11.0 3.6 - 11.2 10*9/L   RBC 3.79 (L) 3.95 - 5.13 10*12/L   HGB 10.2 (L) 11.3 - 14.9 g/dL   HCT 68.0 (L) 65.9 - 55.9 %   MCV 84.0 77.6 - 95.7 fL   MCH 27.0 25.9 - 32.4 pg   MCHC 32.2 32.0 - 36.0 g/dL   RDW 84.4 (H) 87.7 - 84.7 %   MPV 9.1 6.8 - 10.7 fL   Platelet 224 150 - 450 10*9/L   nRBC 0 <=4 /100 WBCs   Neutrophils % 55.0 %   Lymphocytes % 29.6 %   Monocytes % 8.3 %   Eosinophils % 6.8 %   Basophils % 0.3 %   Absolute Neutrophils 6.0 1.8 - 7.8 10*9/L   Absolute Lymphocytes 3.3 1.1 - 3.6 10*9/L   Absolute Monocytes 0.9 (H)  0.3 - 0.8 10*9/L   Absolute Eosinophils 0.8 (H) 0.0 - 0.5 10*9/L   Absolute Basophils 0.0 0.0 - 0.1 10*9/L  POCT Glucose   Collection Time: 11/15/23  1:36 AM  Result Value Ref Range   Glucose, POC 345 (H) 70 - 179 mg/dL  Magnesium    Collection Time: 11/15/23  2:44 AM  Result Value Ref Range   Magnesium  3.1  (H) 1.6 - 2.6 mg/dL  Basic Metabolic Panel   Collection Time: 11/15/23  2:44 AM  Result Value Ref Range   Sodium 140 135 - 145 mmol/L   Potassium 6.0 (H) 3.4 - 4.8 mmol/L   Chloride 101 98 - 107 mmol/L   CO2 24.0 20.0 - 31.0 mmol/L   Anion Gap 15 (H) 5 - 14 mmol/L   BUN 68 (H) 9 - 23 mg/dL   Creatinine 1.34 (H) 9.44 - 1.02 mg/dL   BUN/Creatinine Ratio 8    eGFR CKD-EPI (2021) Female 6 (L) >=60 mL/min/1.36m2   Glucose 189 (H) 70 - 179 mg/dL   Calcium  8.9 8.7 - 10.4 mg/dL  CBC w/ Differential   Collection Time: 11/15/23  2:44 AM  Result Value Ref Range   WBC 13.7 (H) 3.6 - 11.2 10*9/L   RBC 3.57 (L) 3.95 - 5.13 10*12/L   HGB 9.5 (L) 11.3 - 14.9 g/dL   HCT 70.0 (L) 65.9 - 55.9 %   MCV 83.7 77.6 - 95.7 fL   MCH 26.7 25.9 - 32.4 pg   MCHC 31.9 (L) 32.0 - 36.0 g/dL   RDW 84.4 (H) 87.7 - 84.7 %   MPV 9.0 6.8 - 10.7 fL   Platelet 219 150 - 450 10*9/L   Neutrophils % 55.9 %   Lymphocytes % 27.4 %   Monocytes % 8.9 %   Eosinophils % 6.6 %   Basophils % 1.2 %   Absolute Neutrophils 7.6 1.8 - 7.8 10*9/L   Absolute Lymphocytes 3.7 (H) 1.1 - 3.6 10*9/L   Absolute Monocytes 1.2 (H) 0.3 - 0.8 10*9/L   Absolute Eosinophils 0.9 (H) 0.0 - 0.5 10*9/L   Absolute Basophils 0.2 (H) 0.0 - 0.1 10*9/L   Hypochromasia Slight (A) Not Present  ECG 12 Lead   Collection Time: 11/15/23  2:53 AM  Result Value Ref Range   EKG Systolic BP  mmHg   EKG Diastolic BP  mmHg   EKG Ventricular Rate 86 BPM   EKG Atrial Rate 86 BPM   EKG P-R Interval 148 ms   EKG QRS Duration 80 ms   EKG Q-T Interval 414 ms   EKG QTC Calculation 495 ms   EKG Calculated P Axis 29 degrees   EKG Calculated R Axis 4 degrees   EKG Calculated T Axis 69 degrees   QTC Fredericia 467 ms  POCT Glucose   Collection Time: 11/15/23  6:10 AM  Result Value Ref Range   Glucose, POC 129 70 - 179 mg/dL  POCT Glucose   Collection Time: 11/15/23 12:34 PM  Result Value Ref Range   Glucose, POC 195 (H) 70 - 179 mg/dL    Relevant  Studies/Radiology: ECG 12 Lead Result Date: 11/15/2023 NORMAL SINUS RHYTHM CANNOT RULE OUT ANTERIOR INFARCT  (CITED ON OR BEFORE 13-Sep-2023) PROLONGED QT ABNORMAL ECG WHEN COMPARED WITH ECG OF 13-Sep-2023 19:41, QUESTIONABLE CHANGE IN INITIAL FORCES OF LATERAL LEADS NONSPECIFIC T WAVE ABNORMALITY NO LONGER EVIDENT IN LATERAL LEADS  ECG 12 Lead Result Date: 11/15/2023 NORMAL SINUS RHYTHM CANNOT RULE OUT ANTERIOR INFARCT  (CITED ON  OR BEFORE 13-Sep-2023) QTcB >= 480 msec ABNORMAL ECG WHEN COMPARED WITH ECG OF 14-Nov-2023 22:35, NO SIGNIFICANT CHANGE WAS FOUND  ______________________________________________________________________ Discharge Instructions:  Activity Instructions     Activity as tolerated        You were admitted to [facility] with shortness of breath, abdominal pain and in need for dialysis.   You are now ready to discharge and will be discharging to: Home  Please take your medications as prescribed. A full list of medications with any changes is in this discharge packet. Please keep your follow-up appointments after the hospital for ongoing care. It has been a pleasure taking care of you, we wish you the best.   MEDICATION CHANGES: -- We did not change any of your medications. Please continue your home meds.   FOLLOW-UP: -- It is important to follow-up after your hospital discharge, you should follow-up with the following doctors:   Resources and Referrals     Walker     Type: Rolling   Length of Need: 44-months    Resources Family Support Network of [employer]  Non-governmental association in [place], [place]  Directions Nearby Contact us  [address] [place], [place], [place] [address] [phone] (link: [phone]) [email or web address] Open  Closes 5 PM  [facility]ional Child Care Resources Child care & day care in [place], [place] Directions Nearby Contact us  [address] [place], [place] [address] [phone] (link:  [phone]) [email or web address] Open  Closes 4 PM   [facility] Non-governmental association in [place], [place] Directions Nearby Contact us  [address] [place], [place], [place] [address] [phone] (link: [phone]) [email or web address] Open  Closes 5 PM   Free dialysis transportation. Find how to get a free ride to a dialysis treatment center near you. Transportation is provided by non-profits, Engineer, civil (consulting) and volunteers among other resources. Learn more on where patients can get a free ride to a dialysis appointment or center near you below. Dialysis is a life-saving treatment for those suffering from kidney failure, but the process can be overwhelming, hard to get to a appointment and time-consuming. For some patients, transportation to and from treatment centers can be a major challenge, particularly for low-income families, seniors or disabled people or people who have limited community relationships. There are many agencies that try to help patients get kidney care for free, including transportation to an emergency or ongoing dialysis appointment. Non-profit and charities that give free rides to dialysis centers Various non-profit organizations offer free or low-cost transportation services to help lower income or homebound patients get to their dialysis appointments. They also help the uninsured or people on Medicaidf or Medicare among others. These organizations rely on the support of volunteers and donations to provide assistance to those in need. Here are some options to consider: The [employer] ([employer]) is dedicated to helping people affected by kidney disease, including offering free transportation to [facility], [facility], or [facility] centers among others.. They offer the [employer] Cares Patient Help Line at 609-110-1009, where patients can request assistance for transportation to dialysis  treatment centers near you. Free rides to a Dialysis appointment are offered by local non-profits or private companies that get reimbursed by Medicaid. In effect, low income and/or disabled people that have Medicaid health insurance can get free transportation to dialysis treatment centers, doctors, or other medical appointments. Read more on Medicaid transportation programs       Follow Up instructions and Outpatient Referrals    Call MD for:  difficulty breathing, headache or visual disturbances     Call MD for:  persistent nausea or vomiting     Call MD for:  severe uncontrolled pain     Call MD for:  temperature >38.5 Celsius     Discharge instructions      Appointments which have been scheduled for you    Nov 18, 2023 1:30 PM (Arrive by 1:15 PM) RETURN HEART FAILURE with Neal Emil Clause, MD [facility] CARDIOLOGY [place] [facility] REGION) [address] [facility] 1 through 4 [place] [address] [phone]     Nov 20, 2023 9:10 AM (Arrive by 8:55 AM) RETURN DIABETES with Verdon Hutching, MD [facility] DIABETES AND ENDOCRINOLOGY [place] [facility] REGION) [address] [facility] 1 through 4 [place] [address] [phone]     Dec 09, 2023 10:00 AM (Arrive by 9:45 AM) RETURN CONTINUITY with [name] Keven, DO [facility] PRIMARY CARE [address] AT [facility] REGION) [address] [place] [address] [phone]     Dec 11, 2023 9:00 AM (Arrive by 8:45 AM) NEW PULMONARY with Heinz JAYSON Canny, MD [facility] PULMONARY SPECIALTY CL [place] [facility] REGION) [address] [facility] 1 through 4 [place] [address] [phone]     Jan 31, 2024 1:00 PM (Arrive by 12:45 PM) NEW GENERAL PCP with Ileana JULIANNA Lecher, MD [facility] GI MEDICINE [place] [facility] REGION) [address] [facility] 1 through 4 [place] [address] [phone]     Apr 09, 2024 1:00  PM RETURN OPHTHALMOLOGY with Landry Laray Castor, MD [facility] OPHTHALMOLOGY [address] [place] [facility] REGION) [address] [place] [address] [phone]        ______________________________________________________________________ Discharge Day Services: BP 144/61   Pulse 87   Temp 36.8 C (98.2 F) (Oral)   Resp 18   Ht 160 cm (5' 3)   Wt 59 kg (130 lb)   SpO2 98%   BMI 23.03 kg/m   Pt seen on the day of discharge and determined appropriate for discharge.  Condition at Discharge: fair  Length of Discharge: I spent greater than 30 mins in the discharge of this patient.

## 2023-11-17 ENCOUNTER — Observation Stay (HOSPITAL_COMMUNITY)
Admission: EM | Admit: 2023-11-17 | Discharge: 2023-11-18 | Discharge status: Against Medical Advice | Attending: Internal Medicine | Admitting: Internal Medicine

## 2023-11-17 ENCOUNTER — Encounter (HOSPITAL_COMMUNITY): Payer: Self-pay | Admitting: Pharmacy Technician

## 2023-11-17 ENCOUNTER — Emergency Department (HOSPITAL_COMMUNITY)

## 2023-11-17 ENCOUNTER — Other Ambulatory Visit: Payer: Self-pay

## 2023-11-17 DIAGNOSIS — E1029 Type 1 diabetes mellitus with other diabetic kidney complication: Secondary | ICD-10-CM | POA: Diagnosis present

## 2023-11-17 DIAGNOSIS — I4581 Long QT syndrome: Secondary | ICD-10-CM | POA: Insufficient documentation

## 2023-11-17 DIAGNOSIS — N186 End stage renal disease: Secondary | ICD-10-CM

## 2023-11-17 DIAGNOSIS — R9431 Abnormal electrocardiogram [ECG] [EKG]: Secondary | ICD-10-CM | POA: Diagnosis not present

## 2023-11-17 DIAGNOSIS — R109 Unspecified abdominal pain: Principal | ICD-10-CM

## 2023-11-17 DIAGNOSIS — R7401 Elevation of levels of liver transaminase levels: Secondary | ICD-10-CM | POA: Diagnosis not present

## 2023-11-17 DIAGNOSIS — E1022 Type 1 diabetes mellitus with diabetic chronic kidney disease: Secondary | ICD-10-CM | POA: Diagnosis not present

## 2023-11-17 DIAGNOSIS — E875 Hyperkalemia: Secondary | ICD-10-CM | POA: Diagnosis present

## 2023-11-17 DIAGNOSIS — R600 Localized edema: Secondary | ICD-10-CM | POA: Diagnosis present

## 2023-11-17 DIAGNOSIS — Z992 Dependence on renal dialysis: Secondary | ICD-10-CM | POA: Diagnosis not present

## 2023-11-17 DIAGNOSIS — I5043 Acute on chronic combined systolic (congestive) and diastolic (congestive) heart failure: Principal | ICD-10-CM | POA: Diagnosis present

## 2023-11-17 DIAGNOSIS — F319 Bipolar disorder, unspecified: Secondary | ICD-10-CM | POA: Diagnosis present

## 2023-11-17 DIAGNOSIS — I10 Essential (primary) hypertension: Secondary | ICD-10-CM | POA: Diagnosis present

## 2023-11-17 DIAGNOSIS — I132 Hypertensive heart and chronic kidney disease with heart failure and with stage 5 chronic kidney disease, or end stage renal disease: Secondary | ICD-10-CM | POA: Insufficient documentation

## 2023-11-17 DIAGNOSIS — Z794 Long term (current) use of insulin: Secondary | ICD-10-CM | POA: Insufficient documentation

## 2023-11-17 DIAGNOSIS — Z8669 Personal history of other diseases of the nervous system and sense organs: Secondary | ICD-10-CM | POA: Diagnosis not present

## 2023-11-17 DIAGNOSIS — I5042 Chronic combined systolic (congestive) and diastolic (congestive) heart failure: Secondary | ICD-10-CM | POA: Diagnosis present

## 2023-11-17 DIAGNOSIS — Z87898 Personal history of other specified conditions: Secondary | ICD-10-CM

## 2023-11-17 DIAGNOSIS — E1159 Type 2 diabetes mellitus with other circulatory complications: Secondary | ICD-10-CM | POA: Diagnosis present

## 2023-11-17 DIAGNOSIS — R7989 Other specified abnormal findings of blood chemistry: Secondary | ICD-10-CM | POA: Diagnosis present

## 2023-11-17 LAB — I-STAT CHEM 8, ED
BUN: 60 mg/dL — ABNORMAL HIGH (ref 6–20)
Calcium, Ion: 1.05 mmol/L — ABNORMAL LOW (ref 1.15–1.40)
Chloride: 99 mmol/L (ref 98–111)
Creatinine, Ser: 8 mg/dL — ABNORMAL HIGH (ref 0.44–1.00)
Glucose, Bld: 219 mg/dL — ABNORMAL HIGH (ref 70–99)
HCT: 35 % — ABNORMAL LOW (ref 36.0–46.0)
Hemoglobin: 11.9 g/dL — ABNORMAL LOW (ref 12.0–15.0)
Potassium: 6.3 mmol/L (ref 3.5–5.1)
Sodium: 134 mmol/L — ABNORMAL LOW (ref 135–145)
TCO2: 26 mmol/L (ref 22–32)

## 2023-11-17 LAB — COMPREHENSIVE METABOLIC PANEL WITH GFR
ALT: 78 U/L — ABNORMAL HIGH (ref 0–44)
AST: 71 U/L — ABNORMAL HIGH (ref 15–41)
Albumin: 2.6 g/dL — ABNORMAL LOW (ref 3.5–5.0)
Alkaline Phosphatase: 1155 U/L — ABNORMAL HIGH (ref 38–126)
Anion gap: 22 — ABNORMAL HIGH (ref 5–15)
BUN: 68 mg/dL — ABNORMAL HIGH (ref 6–20)
CO2: 20 mmol/L — ABNORMAL LOW (ref 22–32)
Calcium: 9 mg/dL (ref 8.9–10.3)
Chloride: 95 mmol/L — ABNORMAL LOW (ref 98–111)
Creatinine, Ser: 8.2 mg/dL — ABNORMAL HIGH (ref 0.44–1.00)
GFR, Estimated: 6 mL/min — ABNORMAL LOW (ref >60)
Glucose, Bld: 233 mg/dL — ABNORMAL HIGH (ref 70–99)
Potassium: 6.5 mmol/L (ref 3.5–5.1)
Sodium: 137 mmol/L (ref 135–145)
Total Bilirubin: 4.3 mg/dL — ABNORMAL HIGH (ref 0.0–1.2)
Total Protein: 5.4 g/dL — ABNORMAL LOW (ref 6.5–8.1)

## 2023-11-17 LAB — CBC WITH DIFFERENTIAL/PLATELET
Abs Immature Granulocytes: 0.15 K/uL — ABNORMAL HIGH (ref 0.00–0.07)
Basophils Absolute: 0.1 K/uL (ref 0.0–0.1)
Basophils Relative: 1 %
Eosinophils Absolute: 0.3 K/uL (ref 0.0–0.5)
Eosinophils Relative: 3 %
HCT: 33.8 % — ABNORMAL LOW (ref 36.0–46.0)
Hemoglobin: 10.2 g/dL — ABNORMAL LOW (ref 12.0–15.0)
Immature Granulocytes: 1 %
Lymphocytes Relative: 27 %
Lymphs Abs: 3.6 K/uL (ref 0.7–4.0)
MCH: 26.6 pg (ref 26.0–34.0)
MCHC: 30.2 g/dL (ref 30.0–36.0)
MCV: 88 fL (ref 80.0–100.0)
Monocytes Absolute: 1.1 K/uL — ABNORMAL HIGH (ref 0.1–1.0)
Monocytes Relative: 9 %
Neutro Abs: 8.1 K/uL — ABNORMAL HIGH (ref 1.7–7.7)
Neutrophils Relative %: 59 %
Platelets: 293 K/uL (ref 150–400)
RBC: 3.84 MIL/uL — ABNORMAL LOW (ref 3.87–5.11)
RDW: 16.1 % — ABNORMAL HIGH (ref 11.5–15.5)
WBC: 13.4 K/uL — ABNORMAL HIGH (ref 4.0–10.5)
nRBC: 1 % — ABNORMAL HIGH (ref 0.0–0.2)

## 2023-11-17 LAB — I-STAT VENOUS BLOOD GAS, ED
Acid-Base Excess: 2 mmol/L (ref 0.0–2.0)
Bicarbonate: 26.6 mmol/L (ref 20.0–28.0)
Calcium, Ion: 1.04 mmol/L — ABNORMAL LOW (ref 1.15–1.40)
HCT: 33 % — ABNORMAL LOW (ref 36.0–46.0)
Hemoglobin: 11.2 g/dL — ABNORMAL LOW (ref 12.0–15.0)
O2 Saturation: 73 %
Potassium: 6.2 mmol/L — ABNORMAL HIGH (ref 3.5–5.1)
Sodium: 134 mmol/L — ABNORMAL LOW (ref 135–145)
TCO2: 28 mmol/L (ref 22–32)
pCO2, Ven: 42.8 mmHg — ABNORMAL LOW (ref 44–60)
pH, Ven: 7.401 (ref 7.25–7.43)
pO2, Ven: 39 mmHg (ref 32–45)

## 2023-11-17 LAB — TSH: TSH: 5.463 u[IU]/mL — ABNORMAL HIGH (ref 0.350–4.500)

## 2023-11-17 LAB — AMMONIA: Ammonia: 58 umol/L — ABNORMAL HIGH (ref 9–35)

## 2023-11-17 LAB — HCG, SERUM, QUALITATIVE: Preg, Serum: NEGATIVE

## 2023-11-17 LAB — RETICULOCYTES
Immature Retic Fract: 36.6 % — ABNORMAL HIGH (ref 2.3–15.9)
RBC.: 3.85 MIL/uL — ABNORMAL LOW (ref 3.87–5.11)
Retic Count, Absolute: 90.9 K/uL (ref 19.0–186.0)
Retic Ct Pct: 2.4 % (ref 0.4–3.1)

## 2023-11-17 LAB — PROTIME-INR
INR: 1.5 — ABNORMAL HIGH (ref 0.8–1.2)
Prothrombin Time: 18.9 s — ABNORMAL HIGH (ref 11.4–15.2)

## 2023-11-17 LAB — MAGNESIUM: Magnesium: 3.1 mg/dL — ABNORMAL HIGH (ref 1.7–2.4)

## 2023-11-17 LAB — LIPASE, BLOOD: Lipase: 16 U/L (ref 11–51)

## 2023-11-17 LAB — I-STAT CG4 LACTIC ACID, ED: Lactic Acid, Venous: 1.7 mmol/L (ref 0.5–1.9)

## 2023-11-17 LAB — CBG MONITORING, ED
Glucose-Capillary: 220 mg/dL — ABNORMAL HIGH (ref 70–99)
Glucose-Capillary: 146 mg/dL — ABNORMAL HIGH (ref 70–99)

## 2023-11-17 LAB — HEPATITIS B SURFACE ANTIGEN: Hepatitis B Surface Ag: NONREACTIVE

## 2023-11-17 LAB — ETHANOL: Alcohol, Ethyl (B): <15 mg/dL (ref <15)

## 2023-11-17 MED ORDER — HYDROMORPHONE HCL 1 MG/ML IJ SOLN
1.0000 mg | Freq: Once | INTRAMUSCULAR | Status: AC
  Administered 2023-11-17: 1 mg via INTRAMUSCULAR
  Filled 2023-11-17: qty 1

## 2023-11-17 MED ORDER — CHLORHEXIDINE GLUCONATE CLOTH 2 % EX PADS: 6.0000 | MEDICATED_PAD | Freq: Every day | CUTANEOUS | Status: DC

## 2023-11-17 MED ORDER — SODIUM ZIRCONIUM CYCLOSILICATE 10 G PO PACK
10.0000 g | PACK | Freq: Once | ORAL | Status: AC
  Administered 2023-11-17: 10 g via ORAL
  Filled 2023-11-17: qty 1

## 2023-11-17 MED ORDER — INSULIN ASPART 100 UNIT/ML IJ SOLN
0.0000 [IU] | INTRAMUSCULAR | Status: DC
  Administered 2023-11-17: 2 [IU] via SUBCUTANEOUS
  Administered 2023-11-18: 1 [IU] via SUBCUTANEOUS

## 2023-11-17 NOTE — Assessment & Plan Note (Addendum)
 Hold losartan  given hyperkalemia Give a dose of Lokelma  Nephrology aware plan for hemodialysis tonight Monitor on telemetry

## 2023-11-17 NOTE — Assessment & Plan Note (Signed)
 On hemodialysis Tuesday Thursday and Saturday With noncompliance is missed recent Saturday and Thursday hemodialysis.  Now presents with hyperkalemia And fluid overload nephrology aware plan for hemodialysis tonight continue to monitor carefully on telemetry

## 2023-11-17 NOTE — H&P (Signed)
 Ashley Freeman FMW:[id number] DOB: 1990/06/16 DOA: 11/17/2023     PCP: Keven Crumbly Pap, MD   Outpatient Specialists:   CARDS:   Dr. Alm Clay, MD    Patient arrived to ER on 11/17/23 at 1720 Referred by Attending Tegeler, Lonni PARAS, *   Patient coming from:    home Lives   With family    Chief Complaint:  abd pain   HPI: Ashley Freeman is a 33 y.o. female with medical history significant of r ESRD on HD TTS, bipolar disorder, type 1 diabetes, chronic HFrEF, frequent rehospitalizations, episodes of hypoglycemia    Presented with   abd pain  Presents for abdominal pain and distention similar to prior when she administered dialysis. Missed 2 dialysis secondary to lack of transportation. Was seen at [facility] 2 days ago Has had somewhat increased somnolence today Has been vomiting yesterday On arrival CBG 215 blood pressure 142 over 8895% room air found to have potassium of 6.3 nephrology consulted plan to dialyze today Patient has been having bilateral leg edema     Denies significant ETOH intake   Does not smoke       Regarding pertinent Chronic problems:    Hyperlipidemia -  on statins Lipitor    Lipid Panel     Component Value Date/Time   CHOL 235 (H) 05/24/2021 1552   TRIG 157.0 (H) 05/24/2021 1552   HDL 54.70 05/24/2021 1552   CHOLHDL 4 05/24/2021 1552   VLDL 31.4 05/24/2021 1552   LDLCALC 149 (H) 05/24/2021 1552     HTN on Coreg , cozaar    chronic CHF diastolic/systolic/ combined - last echo  Recent Results (from the past 56199 hours)  ECHOCARDIOGRAM COMPLETE   Collection Time: 08/29/23 10:31 AM  Result Value   Weight 2,395.08   Height 63   BP 147/85   Single Plane A2C EF 37.6   Single Plane A4C EF 20.2   Calc EF 29.6   S' Lateral 3.90   AR max vel 1.31   AV Area VTI 1.38   AV Mean grad 6.0   AV Peak grad 9.0   Ao pk vel 1.50   Area-P 1/2 4.01   AV Area mean vel 1.19   MV VTI 2.22   Est EF 30 - 35%    Narrative      ECHOCARDIOGRAM REPORT       1. Left ventricular ejection fraction, by estimation, is 30 to 35%. Left ventricular ejection fraction by 3D volume is 34 %. Left ventricular ejection fraction by 2D MOD biplane is 29.6 %. The left ventricle has moderately decreased function. The left  ventricle demonstrates global hypokinesis. Left ventricular diastolic parameters are consistent with Grade II diastolic dysfunction (pseudonormalization).  2. Right ventricular systolic function is normal. The right ventricular size is mildly enlarged. There is normal pulmonary artery systolic pressure. The estimated right ventricular systolic pressure is 34.4 mmHg.  3. Left atrial size was mildly dilated.  4. Right atrial size was mildly dilated.  5. The mitral valve is normal in structure. No evidence of mitral valve regurgitation. No evidence of mitral stenosis.  6. The tricuspid valve is abnormal. Tricuspid valve regurgitation is severe.  7. The aortic valve is tricuspid. Aortic valve regurgitation is not visualized. No aortic stenosis is present.  8. The inferior vena cava is normal in size with <50% respiratory variability, suggesting right atrial pressure of 8 mmHg.  9. A small pericardial effusion is present. The pericardial effusion is circumferential.  DM 1-  Lab Results  Component Value Date   HGBA1C 10.3 (H) 08/27/2023   on insulin ,        ESRD on HD   Lab Results  Component Value Date   CREATININE 8.00 (H) 11/17/2023   CREATININE 8.40 (H) 11/14/2023   CREATININE 7.15 (H) 11/12/2023   Lab Results  Component Value Date   NA 134 (L) 11/17/2023   NA 134 (L) 11/17/2023   CL 99 11/17/2023   K 6.2 (H) 11/17/2023   K 6.3 (HH) 11/17/2023   CO2 22 11/14/2023   BUN 60 (H) 11/17/2023   CREATININE 8.00 (H) 11/17/2023   GFRNONAA 6 (L) 11/14/2023   CALCIUM  8.7 (L) 11/14/2023   PHOS 8.2 (H) 11/12/2023   ALBUMIN  2.5 (L) 11/14/2023   GLUCOSE 219 (H) 11/17/2023       Liver disease MELD 3.0: 26 at 07/22/2023 10:45 AM MELD-Na: 26 at 07/22/2023 10:45 AM Calculated from: Serum Creatinine: 5.63 mg/dL (Using max of 3 mg/dL) at 6/75/7974 89:54 AM Serum Sodium: 135 mmol/L at 07/22/2023 10:45 AM Total Bilirubin: 1.5 mg/dL at 6/75/7974 89:54 AM Serum Albumin : 3.4 g/dL at 6/75/7974 89:54 AM INR(ratio): 1.4 at 07/20/2023  3:47 AM Age at listing (hypothetical): 33 years Sex: Female at 07/22/2023 10:45 AM  Hepatic Function Panel     Component Value Date/Time   PROT 5.6 (L) 11/14/2023 1305   PROT 5.9 (L) 09/08/2019 0945   PROT 8.0 11/10/2013 2043   ALBUMIN  2.5 (L) 11/14/2023 1305   ALBUMIN  2.9 (L) 09/08/2019 0945   ALBUMIN  3.9 11/10/2013 2043   AST 151 (H) 11/14/2023 1305   AST 12 (L) 11/10/2013 2043   ALT 99 (H) 11/14/2023 1305   ALT 10 (L) 11/10/2013 2043   ALKPHOS 1,261 (H) 11/14/2023 1305   ALKPHOS 77 11/10/2013 2043   BILITOT 1.9 (H) 11/14/2023 1305   BILITOT <0.2 09/08/2019 0945   BILITOT 0.5 11/10/2013 2043   BILIDIR 0.2 11/03/2023 0539   IBILI 0.6 11/03/2023 0539   INR 1.4   Chronic anemia - baseline hg Hemoglobin & Hematocrit  Recent Labs    11/14/23 1305 11/17/23 1853 11/17/23 1927  HGB 10.3* 11.9*  11.2* 10.2*   Iron /TIBC/Ferritin/ %Sat    Component Value Date/Time   IRON  24 (L) 11/06/2021 1457   IRON  51 10/27/2018 1643   TIBC 153 (L) 11/06/2021 1457   TIBC 250 10/27/2018 1643   FERRITIN 340 (H) 04/09/2021 0741   FERRITIN 67 10/27/2018 1643   IRONPCTSAT 16 11/06/2021 1457   IRONPCTSAT 20 10/27/2018 1643      While in ER:     Found to have hyperkalemia in the setting of missing hemodialysis nephrology consulted plan to take patient to HD today    Lab Orders         CBC with Differential         Urinalysis, w/ Reflex to Culture (Infection Suspected) -Urine, Clean Catch         TSH         Ammonia         Hepatitis B surface antigen         Hepatitis B surface antibody,quantitative         CBC with Differential/Platelet          Comprehensive metabolic panel with GFR         Lipase, blood         Magnesium          hCG, serum, qualitative  I-Stat venous blood gas, ([facility] ED, [facility], [facility])         I-stat chem 8, ED (not at [facility], [facility] or [facility])       CXR - . Mild interstitial pulmonary edema, grossly unchanged. 2. Cardiomegaly.    Following Medications were ordered in ER: Medications  Chlorhexidine  Gluconate Cloth 2 % PADS 6 each (has no administration in time range)  HYDROmorphone  (DILAUDID ) injection 1 mg (1 mg Intramuscular Given 11/17/23 1933)    _______________________________________________________ ER Provider Called:     Nephrology   Dr. Melia They Recommend admit to medicine   Plan for HD tonight    ED Triage Vitals  Encounter Vitals Group     BP 11/17/23 1741 (!) 146/87     Girls Systolic BP Percentile --      Girls Diastolic BP Percentile --      Boys Systolic BP Percentile --      Boys Diastolic BP Percentile --      Pulse Rate 11/17/23 1742 71     Resp 11/17/23 1744 17     Temp 11/17/23 1915 97.8 F (36.6 C)     Temp Source 11/17/23 1915 Oral     SpO2 11/17/23 1742 98 %     Weight --      Height --      Head Circumference --      Peak Flow --      Pain Score 11/17/23 1744 9     Pain Loc --      Pain Education --      Exclude from Growth Chart --   UFJK(75)@     _________________________________________ Significant initial  Findings: Abnormal Labs Reviewed  CBC WITH DIFFERENTIAL/PLATELET - Abnormal; Notable for the following components:      Result Value   WBC 13.4 (*)    RBC 3.84 (*)    Hemoglobin 10.2 (*)    HCT 33.8 (*)    RDW 16.1 (*)    nRBC 1.0 (*)    Neutro Abs 8.1 (*)    Monocytes Absolute 1.1 (*)    Abs Immature Granulocytes 0.15 (*)    All other components within normal limits  I-STAT VENOUS BLOOD GAS, ED - Abnormal; Notable for the following components:   pCO2, Ven 42.8 (*)    Sodium 134 (*)    Potassium 6.2 (*)    Calcium , Ion 1.04 (*)    HCT 33.0 (*)     Hemoglobin 11.2 (*)    All other components within normal limits  I-STAT CHEM 8, ED - Abnormal; Notable for the following components:   Sodium 134 (*)    Potassium 6.3 (*)    BUN 60 (*)    Creatinine, Ser 8.00 (*)    Glucose, Bld 219 (*)    Calcium , Ion 1.05 (*)    Hemoglobin 11.9 (*)    HCT 35.0 (*)    All other components within normal limits        ECG: Ordered Personally reviewed and interpreted by me showing: HR : 71 Rhythm:  NSR,    no evidence of ischemic changes Sinus rhythm Prolonged QT interval QTC 505  BNP (last 3 results) Recent Labs    10/12/23 0500 10/23/23 0310 10/25/23 0830  BNP >4,500.0* >4,500.0* >4,500.0*    The recent clinical data is shown below. Vitals:   11/17/23 1915 11/17/23 1930 11/17/23 1933 11/17/23 2000  BP:  (!) 141/85  126/79  Pulse:  73 73 73  Resp:  17 15 14   Temp: 97.8 F (36.6 C)     TempSrc: Oral     SpO2:  99% 100% 100%      WBC     Component Value Date/Time   WBC 13.4 (H) 11/17/2023 1927   LYMPHSABS 3.6 11/17/2023 1927   LYMPHSABS 2.0 05/20/2019 1232   LYMPHSABS 3.7 (H) 11/11/2013 0431   MONOABS 1.1 (H) 11/17/2023 1927   MONOABS 0.5 11/11/2013 0431   EOSABS 0.3 11/17/2023 1927   EOSABS 0.4 05/20/2019 1232   EOSABS 0.1 11/11/2013 0431   BASOSABS 0.1 11/17/2023 1927   BASOSABS 0.0 05/20/2019 1232   BASOSABS 0.1 11/11/2013 0431     Lactic Acid, Venous    Component Value Date/Time   LATICACIDVEN 1.7 11/17/2023 1853           Results for orders placed or performed during the hospital encounter of 11/02/23  MRSA Next Gen by PCR, Nasal     Status: None   Collection Time: 11/03/23  5:51 AM   Specimen: Nasal Mucosa; Nasal Swab  Result Value Ref Range Status   MRSA by PCR Next Gen NOT DETECTED NOT DETECTED Final    Comment: (NOTE) The GeneXpert MRSA Assay (FDA approved for NASAL specimens only), is one component of a comprehensive MRSA colonization surveillance program. It is not intended to diagnose MRSA  infection nor to guide or monitor treatment for MRSA infections. Test performance is not FDA approved in patients less than 66 years old. Performed at [facility], [name] [place], [place] [address]   Gram stain     Status: None   Collection Time: 11/04/23 11:14 AM   Specimen: Abdomen; Peritoneal Fluid  Result Value Ref Range Status   Specimen Description ABDOMEN  Final   Special Requests NONE  Final   Gram Stain   Final    NO WBC SEEN NO ORGANISMS SEEN Performed at [facility], [address] [place], [place] [address]    Report Status 11/04/2023 FINAL  Final  Culture, body fluid w Gram Stain-bottle     Status: None   Collection Time: 11/04/23 11:15 AM   Specimen: Peritoneal Washings  Result Value Ref Range Status   Specimen Description PERITONEAL  Final   Special Requests NONE  Final   Culture   Final    NO GROWTH 5 DAYS Performed at [facility], [address] [place], [place] [address]    Report Status 11/09/2023 FINAL  Final      __________________________________________________________ Recent Labs  Lab 11/11/23 1711 11/11/23 2153 11/12/23 0429 11/14/23 1305 11/17/23 1853 11/17/23 1927  NA 142  --  142 135 134*  134* 137  K 4.3  --  5.2* 6.5* 6.3*  6.2* 6.5*  CO2 26  --  26 22  --  20*  GLUCOSE 90  --  130* 117* 219* 233*  BUN 42*  --  44* 63* 60* 68*  CREATININE 6.93*  --  7.15* 8.40* 8.00* 8.20*  CALCIUM  8.5*  --  8.9 8.7*  --  9.0  MG  --   --   --   --   --  3.1*  PHOS  --  7.7* 8.2*  --   --   --     Cr    Up from baseline see below Lab Results  Component Value Date   CREATININE 8.00 (H) 11/17/2023   CREATININE 8.40 (H) 11/14/2023   CREATININE 7.15 (H) 11/12/2023  Recent Labs  Lab 11/11/23 1711 11/12/23 0429 11/14/23 1305 11/17/23 1927  AST 78*  --  151* 71*  ALT 62*  --  99* 78*  ALKPHOS 792*  --  1,261* 1,155*  BILITOT 1.5*  --  1.9* 4.3*  PROT 5.0*  --  5.6* 5.4*  ALBUMIN  2.2* 2.4* 2.5* 2.6*   Lab  Results  Component Value Date   CALCIUM  8.7 (L) 11/14/2023   PHOS 8.2 (H) 11/12/2023       Plt: Lab Results  Component Value Date   PLT 293 11/17/2023      Recent Labs  Lab 11/11/23 1618 11/11/23 1623 11/11/23 2153 11/12/23 0429 11/14/23 1305 11/17/23 1853 11/17/23 1927  WBC 11.0*  --  11.6* 11.3* 10.4  --  13.4*  NEUTROABS 5.8  --   --   --   --   --  8.1*  HGB 10.7*   < > 10.1* 10.7* 10.3* 11.9*  11.2* 10.2*  HCT 34.7*   < > 32.9* 35.6* 34.6* 35.0*  33.0* 33.8*  MCV 89.9  --  88.4 89.7 90.1  --  88.0  PLT 370  --  266 275 243  --  293   < > = values in this interval not displayed.    HG/HCT  stable,      Component Value Date/Time   HGB 10.2 (L) 11/17/2023 1927   HGB 10.0 (L) 09/08/2019 0940   HCT 33.8 (L) 11/17/2023 1927   HCT 30.3 (L) 09/08/2019 0940   MCV 88.0 11/17/2023 1927   MCV 87 09/08/2019 0940   MCV 80 11/11/2013 0431     Recent Labs  Lab 11/14/23 1305  LIPASE 25    _______________________________________________ Hospitalist was called for admission for hyperkalemia fluid overload   The following Work up has been ordered so far:  Orders Placed This Encounter  Procedures   DG Chest Portable 1 View   CBC with Differential   Urinalysis, w/ Reflex to Culture (Infection Suspected) -Urine, Clean Catch   TSH   Ammonia   Hepatitis B surface antigen   Hepatitis B surface antibody,quantitative   CBC with Differential/Platelet   Comprehensive metabolic panel with GFR   Lipase, blood   Magnesium    hCG, serum, qualitative   ED Cardiac monitoring   Check temperature   ED Cardiac monitoring   Informed Consent Details: Physician/Practitioner Attestation; Transcribe to consent form and obtain patient signature   Pre-Hemodialysis Protocol - Day of Dialysis   Post-Dialysis Protocol - Day of Dialysis   Consult to nephrology   Consult for [facility]   I-Stat venous blood gas, ([facility] ED, [facility], [facility])   I-stat chem 8, ED (not at [facility], [facility] or  [facility])   ED EKG   EKG 12-Lead   Hemodialysis inpatient     OTHER Significant initial  Findings:  labs showing:     DM  labs:  HbA1C: Recent Labs    05/21/23 0527 08/27/23 0526  HGBA1C 10.6* 10.3*       CBG (last 3)  No results for input(s): GLUCAP in the last 72 hours.        Cultures:    Component Value Date/Time   SDES PERITONEAL 11/04/2023 1115   SPECREQUEST NONE 11/04/2023 1115   CULT  11/04/2023 1115    NO GROWTH 5 DAYS Performed at [facility], [address], [place], [place] [address]    REPTSTATUS 11/09/2023 FINAL 11/04/2023 1115     Radiological Exams on  Admission: DG Chest Portable 1 View Result Date: 11/17/2023 EXAM: 1 VIEW XRAY OF THE CHEST 11/17/2023 06:33:00 PM COMPARISON: 11/11/2023 CXR CLINICAL HISTORY: Cough, abdominal pain and distention. Reports being at [facility] 2 days ago and being told something was wrong with her liver. Family reports patient came home Friday afternoon and has been asleep since then until approximately 1 hour ago when she woke up with abdominal pain. Endorses vomiting yesterday. Missed dialysis treatment yesterday. FINDINGS: LUNGS AND PLEURA: Mild interstitial pulmonary edema, grossly unchanged. No focal pulmonary opacity. No pleural effusion. No pneumothorax. HEART AND MEDIASTINUM: Cardiomegaly. No acute abnormality of the cardiac and mediastinal silhouettes. BONES AND SOFT TISSUES: No acute osseous abnormality. IMPRESSION: 1. Mild interstitial pulmonary edema, grossly unchanged. 2. Cardiomegaly. Electronically signed by: Pinkie Pebbles MD 11/17/2023 07:16 PM EDT RP Workstation: [id number]   _______________________________________________________________________________________________________ Latest  Blood pressure 126/79, pulse 73, temperature 97.8 F (36.6 C), temperature source Oral, resp. rate 14, last menstrual period 01/29/2020, SpO2 100%.   Vitals  labs and radiology finding personally reviewed  Review of  Systems:    Pertinent positives include:  lower extremity swelling  abdominal pain Constitutional, :  No weight loss, night sweats, Fevers, chills, fatigue, weight loss  HEENT:  No headaches, Difficulty swallowing,Tooth/dental problems,Sore throat,  No sneezing, itching, ear ache, nasal congestion, post nasal drip,  Cardio-vascular:  No chest pain, Orthopnea, PND, anasarca, dizziness, palpitations.no Bilateral  GI:  No heartburn, indigestion, nausea, vomiting, diarrhea, change in bowel habits, loss of appetite, melena, blood in stool, hematemesis Resp:  no shortness of breath at rest. No dyspnea on exertion, No excess mucus, no productive cough, No non-productive cough, No coughing up of blood.No change in color of mucus.No wheezing. Skin:  no rash or lesions. No jaundice GU:  no dysuria, change in color of urine, no urgency or frequency. No straining to urinate.  No flank pain.  Musculoskeletal:  No joint pain or no joint swelling. No decreased range of motion. No back pain.  Psych:  No change in mood or affect. No depression or anxiety. No memory loss.  Neuro: no localizing neurological complaints, no tingling, no weakness, no double vision, no gait abnormality, no slurred speech, no confusion  All systems reviewed and apart from HOPI all are negative _______________________________________________________________________________________________ Past Medical History:   Past Medical History:  Diagnosis Date   Abscess, gluteal, right 08/24/2013   Anemia 02/19/2012   Bartholin's gland abscess 09/19/2013   Bipolar disorder (HCC)    BV (bacterial vaginosis) 11/24/2015   Depression    Diabetes mellitus type I (HCC) 2001   Diagnosed at age 84 ; Type I   Diarrhea 05/30/2016   DKA (diabetic ketoacidoses) 08/19/2013   Also in 2018   ESRD (end stage renal disease) (HCC)    Gonorrhea 08/2011   Treated in 09/2011   HFrEF (heart failure with reduced ejection fraction) (HCC)    a.  2022 Echo: EF 40%; b. 10/2021 Echo: EF 55%; b. 07/2022 MV: No ischemia. EF 31%; c. 08/2022 Echo: EF 35%, mildly dil RV, sev TR.   History of trichomoniasis 05/31/2016   Hyperlipidemia 03/28/2016   Hypertension    NICM (nonischemic cardiomyopathy) (HCC)    Sepsis (HCC) 09/19/2013      Past Surgical History:  Procedure Laterality Date   A/V FISTULAGRAM Right 06/17/2023   Procedure: A/V Fistulagram;  Surgeon: Marea Selinda RAMAN, MD;  Location: [facility] INVASIVE CV LAB;  Service: Cardiovascular;  Laterality: Right;   A/V SHUNT INTERVENTION N/A 09/25/2023  Procedure: A/V SHUNT INTERVENTION;  Surgeon: Pearline Norman RAMAN, MD;  Location: [facility] PV LAB;  Service: Cardiovascular;  Laterality: N/A;   AV FISTULA PLACEMENT Right 07/06/2022   Procedure: ARTERIOVENOUS GRAFT CREATION;  Surgeon: Gretta Lonni PARAS, MD;  Location: [facility] OR;  Service: Vascular;  Laterality: Right;   CESAREAN SECTION N/A 10/05/2019   Procedure: CESAREAN SECTION;  Surgeon: Izell Harari, MD;  Location: [facility] LD ORS;  Service: Obstetrics;  Laterality: N/A;   CHOLECYSTECTOMY N/A 07/02/2023   Procedure: LAPAROSCOPIC CHOLECYSTECTOMY;  Surgeon: Ebbie Cough, MD;  Location: [facility] OR;  Service: General;  Laterality: N/A;   INCISION AND DRAINAGE ABSCESS Left 09/28/2019   Procedure: INCISION AND DRAINAGE VULVAR ABCESS;  Surgeon: Edsel Norleen GAILS, MD;  Location: [facility] OR;  Service: Gynecology;  Laterality: Left;   INCISION AND DRAINAGE PERIRECTAL ABSCESS Right 08/18/2013   Procedure: IRRIGATION AND DEBRIDEMENT GLUTEAL ABSCESS;  Surgeon: Lynda Leos, MD;  Location: [facility] OR;  Service: General;  Laterality: Right;   INCISION AND DRAINAGE PERIRECTAL ABSCESS Right 09/19/2013   Procedure: IRRIGATION AND DEBRIDEMENT RIGHT GLUTEAL AND LABIAL ABSCESSES;  Surgeon: Lynda Leos, MD;  Location: [facility] OR;  Service: General;  Laterality: Right;   INCISION AND DRAINAGE PERIRECTAL ABSCESS Right 09/24/2013   Procedure: IRRIGATION AND DEBRIDEMENT PERIRECTAL ABSCESS;   Surgeon: Lynwood MALVA Pina, MD;  Location: [facility] OR;  Service: General;  Laterality: Right;   IR PARACENTESIS  08/28/2023   IR PARACENTESIS  11/04/2023    Social History:  Ambulatory   independently       reports that she has never smoked. She has never been exposed to tobacco smoke. She has never used smokeless tobacco. She reports that she does not currently use alcohol. She reports that she does not use drugs.    Family History:   Family History  Problem Relation Age of Onset   Asthma Mother    Carpal tunnel syndrome Mother    Gout Father    Diabetes Paternal Grandmother    Anesthesia problems Neg Hx    ______________________________________________________________________________________________ Allergies: Allergies  Allergen Reactions   Cephalexin  Anaphylaxis and Other (See Comments)    Has gotten ceftriaxone  in the past    Morphine  Itching   Penicillins Hives and Rash   Fish Allergy Other (See Comments)    Allergic   Benadryl  [Diphenhydramine ] Itching   Dilaudid  [Hydromorphone ] Itching   Doxycycline  Itching   Methotrexate Derivatives Rash   Oxycodone  Itching     Prior to Admission medications   Medication Sig Start Date End Date Taking? Authorizing Provider  albuterol  (VENTOLIN  HFA) 108 (90 Base) MCG/ACT inhaler Inhale 2 puffs into the lungs every 4 (four) hours as needed for wheezing or shortness of breath. Patient not taking: Reported on 11/02/2023 11/24/22   [provider]  atorvastatin  (LIPITOR ) 80 MG tablet Take 1 tablet (80 mg total) by mouth daily. 10/15/23   Danford, Lonni SQUIBB, MD  bumetanide  (BUMEX ) 2 MG tablet Take 2 mg by mouth daily.    [provider]  calcitRIOL  (ROCALTROL ) 0.25 MCG capsule Take 5 capsules (1.25 mcg total) by mouth every Tuesday, Thursday, and Saturday at 6 PM. 07/09/23   Cindy Garnette POUR, MD  carvedilol  (COREG ) 12.5 MG tablet Take 1 tablet (12.5 mg total) by mouth 2 (two) times daily with a meal. 10/29/23   Regalado, Belkys  A, MD  Continuous Glucose Sensor (DEXCOM G7 SENSOR) MISC Change sensors every 10 days Patient taking differently: Inject 1 Device into the skin See admin instructions. Place 1 new sensor  into the skin every 10 days 09/07/22   Shamleffer, Ibtehal Jaralla, MD  cyclobenzaprine  (FLEXERIL ) 5 MG tablet Take 1 tablet (5 mg total) by mouth 3 (three) times daily as needed for muscle spasms. 08/31/23   Ghimire, Donalda HERO, MD  dicyclomine  (BENTYL ) 20 MG tablet Take 20 mg by mouth 2 (two) times daily as needed for spasms.    [provider]  DULoxetine  (CYMBALTA ) 20 MG capsule Take 20 mg by mouth 2 (two) times daily. 07/29/23   [provider]  famotidine  (PEPCID ) 20 MG tablet Take 20 mg by mouth daily before breakfast. 07/29/23   [provider]  insulin  aspart (NOVOLOG ) 100 UNIT/ML injection Inject 5 Units into the skin 3 (three) times daily with meals.    [provider]  lamoTRIgine  (LAMICTAL ) 200 MG tablet Take 1 tablet (200 mg total) by mouth daily. 10/29/23   Regalado, Belkys A, MD  LANTUS  SOLOSTAR 100 UNIT/ML Solostar Pen Inject 10 Units into the skin in the morning.    [provider]  losartan  (COZAAR ) 100 MG tablet Take 150 mg by mouth daily. 03/25/23   [provider]  metoCLOPramide  (REGLAN ) 10 MG tablet Take 1 tablet (10 mg total) by mouth every 8 (eight) hours as needed for nausea. 10/14/23   Danford, Lonni SQUIBB, MD  OLANZapine  zydis (ZYPREXA ) 5 MG disintegrating tablet Take 5 mg by mouth at bedtime. 09/23/23 12/22/23  [provider]  ondansetron  (ZOFRAN -ODT) 4 MG disintegrating tablet Take 1 tablet (4 mg total) by mouth every 8 (eight) hours as needed for nausea or vomiting. 06/23/23   Davis, Jonathon H, MD  Pancrelipase , Lip-Prot-Amyl, 3000-9500 units CPEP Take 3,000 units of lipase by mouth 3 (three) times daily. 09/23/23 12/22/23  [provider]  sevelamer  carbonate (RENVELA ) 800 MG tablet Take 2,400 mg by mouth 3 (three) times  daily with meals.    [provider]  sodium bicarbonate  650 MG tablet Take 1,300 mg by mouth 2 (two) times daily. 07/29/23   [provider]  SUMAtriptan  (IMITREX ) 50 MG tablet Take 50 mg by mouth every 2 (two) hours as needed for migraine or headache. 06/20/22   [provider]    ___________________________________________________________________________________________________ Physical Exam:    11/17/2023    8:00 PM 11/17/2023    7:33 PM 11/17/2023    7:30 PM  Vitals with BMI  Systolic 126  141  Diastolic 79  85  Pulse 73 73 73     1. General:  in No  Acute distress    Chronically ill  -appearing 2. Psychological: Alert and   Oriented 3. Head/ENT:   Moist   Mucous Membranes                          Head Non traumatic, neck supple                          Poor Dentition 4. SKIN: normal   Skin turgor,  Skin clean Dry and intact no rash    5. Heart: Regular rate and rhythm no  Murmur, no Rub or gallop 6. Lungs:   no wheezes some crackles   7. Abdomen: Soft,  non-tender, distended   obese  bowel sounds present 8. Lower extremities: no clubbing, cyanosis, L>R  edema 9. Neurologically Grossly intact, moving all 4 extremities equally  10. MSK: Normal range of motion    Chart has been reviewed  ______________________________________________________________________________________________  Assessment/Plan 33 y.o. female with medical history significant of r ESRD on HD TTS, bipolar disorder, type 1 diabetes, chronic HFrEF, frequent rehospitalizations, episodes of hypoglycemia    Admitted for   hyperkalemia fluid overload noncompliance with hemodialysis     Present on Admission:  Hyperkalemia  Acute on chronic combined systolic and diastolic CHF (congestive heart failure) (HCC)  Prolonged QT interval  Essential hypertension  Bipolar disorder (HCC)  DM (diabetes mellitus), type 1 with renal complications (HCC)  Elevated LFTs  Leg edema, left     Acute on chronic combined systolic and diastolic CHF (congestive heart failure) (HCC) Appreciate nephrology consult plan for hemodialysis tonight orders in place Last echo was just in end of May Fluid overload in the setting of noncompliance with hemodialysis  ESRD on hemodialysis [facility]) On hemodialysis Tuesday Thursday and Saturday With noncompliance is missed recent Saturday and Thursday hemodialysis.  Now presents with hyperkalemia And fluid overload nephrology aware plan for hemodialysis tonight continue to monitor carefully on telemetry  Hyperkalemia Hold losartan  given hyperkalemia Give a dose of Lokelma  Nephrology aware plan for hemodialysis tonight Monitor on telemetry  Prolonged QT interval - will monitor on tele avoid QT prolonging medications, rehydrate correct electrolytes   Essential hypertension Restarted Coreg  12.5 mg p.o. twice daily but hold off on Cozaar   Bipolar disorder (HCC) Continue home medications Lamictal  200 mg Zyprexa   DM (diabetes mellitus), type 1 with renal complications (HCC) Order sliding scale Continue insulin  Lantus  10 units every morning  Elevated LFTs Possibly underlying liver disease MELD 3.0: 30 at 11/17/2023  9:01 PM   In the setting of liver congestion secondary to CHF and fluid overload secondary to missing dialysis  Her symptoms improved in the past after hemodialysis CT scan performed in May 2025 showed hepatomegaly and nodular lobular contour can be seen with cirrhosis Latest CAT scan done on 06 November 2023 showing hepatomegaly with heterogeneous appearance of the liver could be secondary to passive congestion there is moderate volume of ascites with moderate to large pelvic ascites Patient status post cholecystectomy  Would benefit from follow-up with hepatology   History of seizure disorder Continue Lamictal  200 mg a day  Leg edema, left Obtain Dopplers   Other plan as per orders.  DVT prophylaxis:  SCD     Code Status:     Code Status: Prior FULL CODE as per patient   I had personally discussed CODE STATUS with patient  ACP    none    Family Communication:   Family at  Bedside    Diet heart healthy  Disposition Plan:   To home once workup is complete and patient is stable   Following barriers for discharge:                                                      Electrolytes corrected                                                          Will need consultants to evaluate patient prior to discharge  Consults called: Nephrology  Treatment Team:  Melia Lynwood ORN, MD  Admission status:  ED Disposition     ED Disposition  Admit   Condition  --   Comment  Hospital [facility]: [facility] [[id number]]  Level of Care: Progressive [102]  Admit to Progressive based on following criteria: CARDIOVASCULAR & THORACIC of moderate stability with acute coronary syndrome symptoms/low risk myocardial infarction/hypertensive urgency/arrhythmias/heart failure potentially compromising stability and stable post cardiovascular intervention patients.  Admit to Progressive based on following criteria: NEPHROLOGY stable condition requiring close monitoring for AKI, requiring Hemodialysis or Peritoneal Dialysis either from expected electrolyte imbalance, acidosis, or fluid overload that can be managed by NIPPV or high flow oxygen.  May place patient in observation at [facility] or [facility] if equivalent level of care is available:: No  Covid Evaluation: Asymptomatic - no recent exposure (last 10 days) testing not required  Diagnosis: Hyperkalemia [827805]  Admitting Physician: Corrado Hymon [[id number]]  Attending Physician: Raileigh Sabater [[id number]]  For patients discharging to extended facilities (i.e. SNF, AL, group homes or LTAC) initiate:: Discharge to SNF/Facility Placement COVID-19 Lab Testing Protocol           Obs      Level of care      progressive       Mourad Cwikla 11/17/2023, 10:51 PM    [facility]     after 2 AM please page floor coverage   If 7AM-7PM, please contact the day team taking care of the patient using [email or web address]

## 2023-11-17 NOTE — Assessment & Plan Note (Signed)
Obtain Dopplers

## 2023-11-17 NOTE — Assessment & Plan Note (Signed)
 Order sliding scale Continue insulin  Lantus  10 units every morning

## 2023-11-17 NOTE — ED Notes (Signed)
 This RN called Dialysis unit to ascertain when this patient will be dialyzed.  No answer.

## 2023-11-17 NOTE — Assessment & Plan Note (Addendum)
 Continue home medications Lamictal  200 mg Zyprexa 

## 2023-11-17 NOTE — ED Provider Notes (Signed)
 [facility] EMERGENCY DEPARTMENT AT [facility] Provider Note   CSN: [id number] Arrival date & time: 11/17/23  [id number]     Patient presents with: No chief complaint on file.   Ashley Freeman is a 33 y.o. female.   The history is provided by the patient and medical records. No language interpreter was used.  Altered Mental Status Presenting symptoms: no confusion   Presenting symptoms comment:  Somnolence Severity:  Moderate Most recent episode:  2 days ago Episode history:  Continuous Timing:  Constant Progression:  Waxing and waning Chronicity:  Recurrent Context: recent illness   Associated symptoms: abdominal pain, nausea and vomiting   Associated symptoms: no agitation, no fever, no hallucinations, no headaches, no light-headedness, no palpitations, no rash, no seizures and no visual change        Prior to Admission medications   Medication Sig Start Date End Date Taking? Authorizing Provider  albuterol  (VENTOLIN  HFA) 108 (90 Base) MCG/ACT inhaler Inhale 2 puffs into the lungs every 4 (four) hours as needed for wheezing or shortness of breath. Patient not taking: Reported on 11/02/2023 11/24/22   [provider]  atorvastatin  (LIPITOR ) 80 MG tablet Take 1 tablet (80 mg total) by mouth daily. 10/15/23   Danford, Lonni SQUIBB, MD  bumetanide  (BUMEX ) 2 MG tablet Take 2 mg by mouth daily.    [provider]  calcitRIOL  (ROCALTROL ) 0.25 MCG capsule Take 5 capsules (1.25 mcg total) by mouth every Tuesday, Thursday, and Saturday at 6 PM. 07/09/23   Cindy Garnette POUR, MD  carvedilol  (COREG ) 12.5 MG tablet Take 1 tablet (12.5 mg total) by mouth 2 (two) times daily with a meal. 10/29/23   Regalado, Belkys A, MD  Continuous Glucose Sensor (DEXCOM G7 SENSOR) MISC Change sensors every 10 days Patient taking differently: Inject 1 Device into the skin See admin instructions. Place 1 new sensor into the skin every 10 days 09/07/22   Shamleffer, Ibtehal  Jaralla, MD  cyclobenzaprine  (FLEXERIL ) 5 MG tablet Take 1 tablet (5 mg total) by mouth 3 (three) times daily as needed for muscle spasms. 08/31/23   Ghimire, Donalda HERO, MD  dicyclomine  (BENTYL ) 20 MG tablet Take 20 mg by mouth 2 (two) times daily as needed for spasms.    [provider]  DULoxetine  (CYMBALTA ) 20 MG capsule Take 20 mg by mouth 2 (two) times daily. 07/29/23   [provider]  famotidine  (PEPCID ) 20 MG tablet Take 20 mg by mouth daily before breakfast. 07/29/23   [provider]  insulin  aspart (NOVOLOG ) 100 UNIT/ML injection Inject 5 Units into the skin 3 (three) times daily with meals.    [provider]  lamoTRIgine  (LAMICTAL ) 200 MG tablet Take 1 tablet (200 mg total) by mouth daily. 10/29/23   Regalado, Belkys A, MD  LANTUS  SOLOSTAR 100 UNIT/ML Solostar Pen Inject 10 Units into the skin in the morning.    [provider]  losartan  (COZAAR ) 100 MG tablet Take 150 mg by mouth daily. 03/25/23   [provider]  metoCLOPramide  (REGLAN ) 10 MG tablet Take 1 tablet (10 mg total) by mouth every 8 (eight) hours as needed for nausea. 10/14/23   Danford, Lonni SQUIBB, MD  OLANZapine  zydis (ZYPREXA ) 5 MG disintegrating tablet Take 5 mg by mouth at bedtime. 09/23/23 12/22/23  [provider]  ondansetron  (ZOFRAN -ODT) 4 MG disintegrating tablet Take 1 tablet (4 mg total) by mouth every 8 (eight) hours as needed for nausea or vomiting. 06/23/23   Davis, Jonathon  H, MD  Pancrelipase , Lip-Prot-Amyl, 3000-9500 units CPEP Take 3,000 units of lipase by mouth 3 (three) times daily. 09/23/23 12/22/23  [provider]  sevelamer  carbonate (RENVELA ) 800 MG tablet Take 2,400 mg by mouth 3 (three) times daily with meals.    [provider]  sodium bicarbonate  650 MG tablet Take 1,300 mg by mouth 2 (two) times daily. 07/29/23   [provider]  SUMAtriptan  (IMITREX ) 50 MG tablet Take 50 mg by mouth every 2 (two) hours as needed for  migraine or headache. 06/20/22   [provider]    Allergies: Cephalexin , Morphine , Penicillins, Fish allergy, Benadryl  [diphenhydramine ], Dilaudid  [hydromorphone ], Doxycycline , Methotrexate derivatives, and Oxycodone     Review of Systems  Constitutional:  Positive for fatigue. Negative for chills, diaphoresis and fever.  HENT:  Negative for congestion.   Eyes:  Negative for visual disturbance.  Respiratory:  Positive for cough. Negative for chest tightness, shortness of breath and wheezing.   Cardiovascular:  Negative for chest pain, palpitations and leg swelling.  Gastrointestinal:  Positive for abdominal distention, abdominal pain, nausea and vomiting. Negative for anal bleeding, blood in stool, constipation and diarrhea.  Genitourinary:  Negative for dysuria, flank pain and frequency.  Musculoskeletal:  Negative for back pain, neck pain and neck stiffness.  Skin:  Negative for rash and wound.  Neurological:  Negative for seizures, light-headedness, numbness and headaches.  Psychiatric/Behavioral:  Negative for agitation, confusion and hallucinations.   All other systems reviewed and are negative.   Updated Vital Signs BP (!) 146/87   Pulse 71   Resp 17   LMP 01/29/2020 (Approximate)   SpO2 98%   Physical Exam Vitals and nursing note reviewed.  Constitutional:      General: She is not in acute distress.    Appearance: She is well-developed. She is not ill-appearing, toxic-appearing or diaphoretic.  HENT:     Head: Normocephalic and atraumatic.     Nose: No congestion or rhinorrhea.     Mouth/Throat:     Pharynx: No oropharyngeal exudate or posterior oropharyngeal erythema.  Eyes:     Extraocular Movements: Extraocular movements intact.     Conjunctiva/sclera: Conjunctivae normal.     Pupils: Pupils are equal, round, and reactive to light.  Cardiovascular:     Rate and Rhythm: Normal rate and regular rhythm.     Heart sounds: No murmur heard. Pulmonary:      Effort: Pulmonary effort is normal. No respiratory distress.     Breath sounds: Normal breath sounds. No wheezing, rhonchi or rales.  Chest:     Chest wall: No tenderness.  Abdominal:     General: Abdomen is flat.     Palpations: Abdomen is soft.     Tenderness: There is abdominal tenderness. There is no right CVA tenderness, left CVA tenderness, guarding or rebound.  Musculoskeletal:        General: No swelling or tenderness.     Cervical back: Neck supple. No tenderness.     Right lower leg: Edema (unchanged per pt) present.     Left lower leg: Edema (unchanged per pt) present.  Skin:    General: Skin is warm and dry.     Capillary Refill: Capillary refill takes less than 2 seconds.     Findings: No erythema or rash.  Neurological:     General: No focal deficit present.     Mental Status: She is alert.     Sensory: No sensory deficit.     Motor: No weakness.  Psychiatric:        Mood and Affect: Mood normal.     (all labs ordered are listed, but only abnormal results are displayed) Labs Reviewed  CBC WITH DIFFERENTIAL/PLATELET - Abnormal; Notable for the following components:      Result Value   WBC 13.4 (*)    RBC 3.84 (*)    Hemoglobin 10.2 (*)    HCT 33.8 (*)    RDW 16.1 (*)    nRBC 1.0 (*)    Neutro Abs 8.1 (*)    Monocytes Absolute 1.1 (*)    Abs Immature Granulocytes 0.15 (*)    All other components within normal limits  I-STAT VENOUS BLOOD GAS, ED - Abnormal; Notable for the following components:   pCO2, Ven 42.8 (*)    Sodium 134 (*)    Potassium 6.2 (*)    Calcium , Ion 1.04 (*)    HCT 33.0 (*)    Hemoglobin 11.2 (*)    All other components within normal limits  I-STAT CHEM 8, ED - Abnormal; Notable for the following components:   Sodium 134 (*)    Potassium 6.3 (*)    BUN 60 (*)    Creatinine, Ser 8.00 (*)    Glucose, Bld 219 (*)    Calcium , Ion 1.05 (*)    Hemoglobin 11.9 (*)    HCT 35.0 (*)    All other components within normal limits  CBC WITH  DIFFERENTIAL/PLATELET  URINALYSIS, W/ REFLEX TO CULTURE (INFECTION SUSPECTED)  TSH  AMMONIA  HEPATITIS B SURFACE ANTIGEN  HEPATITIS B SURFACE ANTIBODY, QUANTITATIVE  COMPREHENSIVE METABOLIC PANEL WITH GFR  LIPASE, BLOOD  MAGNESIUM   HCG, SERUM, QUALITATIVE  I-STAT CG4 LACTIC ACID, ED    EKG: EKG Interpretation Date/Time:  Sunday November 17 2023 18:28:13 EDT Ventricular Rate:  71 PR Interval:  142 QRS Duration:  100 QT Interval:  464 QTC Calculation: 505 R Axis:   37  Text Interpretation: Sinus rhythm Prolonged QT interval simialr to prior No STEMI Confirmed by Ginger Barefoot ([id number]) on 11/17/2023 6:39:53 PM  Radiology: DG Chest Portable 1 View Result Date: 11/17/2023 EXAM: 1 VIEW XRAY OF THE CHEST 11/17/2023 06:33:00 PM COMPARISON: 11/11/2023 CXR CLINICAL HISTORY: Cough, abdominal pain and distention. Reports being at [facility] 2 days ago and being told something was wrong with her liver. Family reports patient came home Friday afternoon and has been asleep since then until approximately 1 hour ago when she woke up with abdominal pain. Endorses vomiting yesterday. Missed dialysis treatment yesterday. FINDINGS: LUNGS AND PLEURA: Mild interstitial pulmonary edema, grossly unchanged. No focal pulmonary opacity. No pleural effusion. No pneumothorax. HEART AND MEDIASTINUM: Cardiomegaly. No acute abnormality of the cardiac and mediastinal silhouettes. BONES AND SOFT TISSUES: No acute osseous abnormality. IMPRESSION: 1. Mild interstitial pulmonary edema, grossly unchanged. 2. Cardiomegaly. Electronically signed by: Pinkie Pebbles MD 11/17/2023 07:16 PM EDT RP Workstation: [id number]     Procedures   Medications Ordered in the ED  Chlorhexidine  Gluconate Cloth 2 % PADS 6 each (has no administration in time range)  HYDROmorphone  (DILAUDID ) injection 1 mg (1 mg Intramuscular Given 11/17/23 1933)   CRITICAL CARE Performed by: Lonni PARAS Francisca Harbuck Total critical care time: 25  minutes Critical care time was exclusive of separately billable procedures and treating other patients. Critical care was necessary to treat or prevent imminent or life-threatening deterioration. Critical care was time spent personally by me on the following activities: development of treatment plan with patient and/or surrogate as well as nursing, discussions  with consultants, evaluation of patient's response to treatment, examination of patient, obtaining history from patient or surrogate, ordering and performing treatments and interventions, ordering and review of laboratory studies, ordering and review of radiographic studies, pulse oximetry and re-evaluation of patient's condition.                                   Medical Decision Making Amount and/or Complexity of Data Reviewed Labs: ordered. Radiology: ordered.  Risk Prescription drug management. Decision regarding hospitalization.    Alaura Schippers Freeman is a 33 y.o. female with a past medical history significant for ESRD on dialysis, type 1 diabetes with previous DKA, hyperlipidemia, hypertension, CHF, depression, and recent admission for abdominal pain and fluid overload [facility] who presents with fatigue, somnolence, nausea, vomiting, and abdominal discomfort.  According to patient, she feels the exact same as she did last week when she had to be admitted at [facility] for fluid overload and needing dialysis.  She says that she missed 2 treatments of dialysis the other time needing admission and then missed the last 2 dialysis treatments due to transportation.  She reports she is a Tuesday Thursday Saturday dialysis patient and missed Thursday and Saturday.  She says that for the last few days she has been feeling more fatigued and tired and has had some abdominal discomfort feeling the exact same.  She denies any fevers or chills and denies any chest pain or shortness of breath.  She does have some mild cough but denies any production.   She denies any new leg pain or leg swelling.  She reports no flu or COVID exposures.  She said she did not have infection in her abdomen last time she was admitted.  On exam, lungs clear.  Chest nontender.  Abdomen has mild tenderness.  Bowel sounds were appreciated.  Back and flanks nontender.  Mild edema in legs.  Patient is very sleepy but otherwise resting.  Patient's chart review shows that she had a CT scan that did not show an acute surgical problem.  After dialysis during her last visit she improved.  As a symptom similar, will hold on CT imaging or emergent paracentesis as I was suspicion for SBP.  We will get screening labs and urinalysis.  Will get chest x-ray and COVID swab given the mild cough and will give her some medication to help with symptoms.  I called and spoke with nephrology who says she will need dialysis tonight as her initial potassium was over 6.  They agree with medicine admission as well.  Will call for medicine admission when some labs have returned.           7:23 PM Spoke to nephrology who said they would try to dialyze her tonight and agreed with medicine admission for the recurrent problem.  Will call medicine for admission after other labs have returned.      Final diagnoses:  Abdominal pain, unspecified abdominal location     Clinical Impression: 1. Abdominal pain, unspecified abdominal location     Disposition: Admit  This note was prepared with assistance of Dragon voice recognition software. Occasional wrong-word or sound-a-like substitutions may have occurred due to the inherent limitations of voice recognition software.      Kline Bulthuis, Lonni PARAS, MD 11/17/23 2030

## 2023-11-17 NOTE — Assessment & Plan Note (Addendum)
 Possibly underlying liver disease MELD 3.0: 30 at 11/17/2023  9:01 PM   In the setting of liver congestion secondary to CHF and fluid overload secondary to missing dialysis  Her symptoms improved in the past after hemodialysis CT scan performed in May 2025 showed hepatomegaly and nodular lobular contour can be seen with cirrhosis Latest CAT scan done on 06 November 2023 showing hepatomegaly with heterogeneous appearance of the liver could be secondary to passive congestion there is moderate volume of ascites with moderate to large pelvic ascites Patient status post cholecystectomy  Would benefit from follow-up with hepatology

## 2023-11-17 NOTE — ED Notes (Addendum)
 Disregard note.

## 2023-11-17 NOTE — Assessment & Plan Note (Signed)
Continue Lamictal 200 mg a day

## 2023-11-17 NOTE — Consult Note (Signed)
 Reason for Consult: ESRD Referring Physician:  Dr. Ginger  Chief Complaint: Abdominal pain  OP HD: TTS [facility] [address] 3h  60kg   B350  AVG   Heparin  1600 + 600u/hr  Assessment/Plan: ESRD on dialysis Tuesday Thursdays and Saturdays initially on PD but transition to intermittent hemodialysis in 2023 recently admitted to [facility] because of hyperkalemia and volume overload after missing dialysis treatments because of childcare for her daughter. -Plan on dialyzing emergently tonight  Additional recommendations - Dose all meds for creatinine clearance < 10 ml/min  - Unless absolutely necessary, no MRIs with gadolinium.  - Prefer needle sticks in the dorsum of the hands or wrists.  No blood pressure measurements in right arm. - If blood transfusion is requested during hemodialysis sessions, please alert us  prior to the session.   Avoid nephrotoxic medications including NSAIDs and iodinated intravenous contrast exposure unless the latter is absolutely indicated.  Preferred narcotic agents for pain control are hydromorphone , fentanyl , and methadone. Morphine  should not be used. Avoid Baclofen and avoid oral sodium phosphate  and magnesium  citrate based laxatives / bowel preps. Continue strict Input and Output monitoring. Will monitor the patient closely with you and intervene or adjust therapy as indicated by changes in clinical status/labs   Renal osteodystrophy -we will check a phosphorus for binder management Anemia -we will check an iron  panel; no obvious signs of infection although SBP is being ruled out, lower on the differential.  Hemoglobin 11.9. Diabetes mellitus -very poorly controlled with A1c of 11.1 in May. HFrEF secondary to dilated cardiomyopathy with echocardiogram 09/14/2023 showing EF of 25 to 30%.  Of note she was told to stop the losartan  because of hyperkalemia.  Already seen by cardiology previously and spironolactone could be of benefit if patient compliance  improves. Elevated liver enzymes -likely secondary to congestion given volume overload on examination as well as history of multiple missed dialysis treatments.   HPI: Ashley Freeman is an 33 y.o. female with a history of T1DM, ESRD on dialysis Tuesday Thursdays and Saturdays, HFrEF (EF 25 to 30% 08/2023), hypertension, hyperlipidemia, seizures recently seen in [facility] with abdominal pain, lower extremity pain and shortness of breath after missing dialysis treatments.  She is presenting with abdominal pain and distention and was found to be asleep at home by the family.  She woke up with abdominal pain with vomiting yesterday as well.  Patient missed her last 2 dialysis treatment; her last treatment was on Tuesday.    At time of examination in the ED her main complaint was generalized bodyaches but she denies any fevers, chills, myalgias.  She has had some nausea and vomiting but also denies any diarrhea, headaches, syncopal episodes.  Patient also denies any chest pain.  She has had a cough but is nonproductive.  CBG 215, BP 142/88 in the ED saturating 95% on room air.  AP, AST, ALT elevated.  Chest x-ray shows mild interstitial pulmonary edema and cardiomegaly.  ROS Pertinent items are noted in HPI.  Chemistry and CBC: Creatinine  Date/Time Value Ref Range Status  11/12/2013 04:23 AM 0.59 (L) 0.60 - 1.30 mg/dL Final  92/84/7984 95:68 AM 0.70 0.60 - 1.30 mg/dL Final  92/85/7984 91:56 PM 0.71 0.60 - 1.30 mg/dL Final  94/83/7986 93:76 AM 0.69 0.60 - 1.30 mg/dL Final  94/84/7986 97:46 AM 0.84 0.60 - 1.30 mg/dL Final  94/85/7986 88:83 AM 0.83 0.60 - 1.30 mg/dL Final  94/85/7986 96:71 AM 0.99 0.60 - 1.30 mg/dL Final  94/86/7986 88:89  PM 1.01 0.60 - 1.30 mg/dL Final  94/86/7986 98:43 PM 1.44 (H) 0.60 - 1.30 mg/dL Final   Creat  Date/Time Value Ref Range Status  08/24/2013 09:57 AM 0.51 0.50 - 1.10 mg/dL Final  92/90/7986 96:43 PM 0.59 0.50 - 1.10 mg/dL Final   Creatinine, Ser   Date/Time Value Ref Range Status  11/17/2023 06:53 PM 8.00 (H) 0.44 - 1.00 mg/dL Final  92/82/7974 98:94 PM 8.40 (H) 0.44 - 1.00 mg/dL Final  92/84/7974 95:70 AM 7.15 (H) 0.44 - 1.00 mg/dL Final  92/85/7974 94:88 PM 6.93 (H) 0.44 - 1.00 mg/dL Final  92/85/7974 95:76 PM 7.00 (H) 0.44 - 1.00 mg/dL Final  92/89/7974 97:48 PM 7.14 (H) 0.44 - 1.00 mg/dL Final  92/91/7974 91:93 AM 7.60 (H) 0.44 - 1.00 mg/dL Final  92/93/7974 94:60 AM 9.48 (H) 0.44 - 1.00 mg/dL Final  92/94/7974 90:59 PM 9.28 (H) 0.44 - 1.00 mg/dL Final  92/96/7974 90:50 PM 8.81 (H) 0.44 - 1.00 mg/dL Final  92/96/7974 90:51 PM 8.40 (H) 0.44 - 1.00 mg/dL Final  92/98/7974 97:42 AM 6.68 (H) 0.44 - 1.00 mg/dL Final  93/69/7974 91:46 PM 6.39 (H) 0.44 - 1.00 mg/dL Final  93/69/7974 94:83 PM 6.32 (H) 0.44 - 1.00 mg/dL Final  93/69/7974 98:59 PM 6.37 (H) 0.44 - 1.00 mg/dL Final  93/69/7974 90:46 AM 6.44 (H) 0.44 - 1.00 mg/dL Final  93/69/7974 91:80 AM 6.36 (H) 0.44 - 1.00 mg/dL Final  93/69/7974 97:85 AM 6.22 (H) 0.44 - 1.00 mg/dL Final  93/70/7974 95:69 AM 8.67 (H) 0.44 - 1.00 mg/dL Final  93/71/7974 89:80 AM 8.14 (H) 0.44 - 1.00 mg/dL Final  93/72/7974 88:65 AM 7.71 (H) 0.44 - 1.00 mg/dL Final  93/72/7974 92:51 AM 7.64 (H) 0.44 - 1.00 mg/dL Final  93/74/7974 96:89 AM 8.96 (H) 0.44 - 1.00 mg/dL Final  93/77/7974 89:59 PM 6.70 (H) 0.44 - 1.00 mg/dL Final  93/78/7974 93:98 AM 7.81 (H) 0.44 - 1.00 mg/dL Final  93/83/7974 95:86 AM 6.52 (H) 0.44 - 1.00 mg/dL Final  93/85/7974 95:82 AM 7.74 (H) 0.44 - 1.00 mg/dL Final  93/92/7974 89:41 PM 8.60 (H) 0.44 - 1.00 mg/dL Final  93/92/7974 89:52 PM 8.90 (H) 0.44 - 1.00 mg/dL Final  94/68/7974 97:76 AM 6.55 (H) 0.44 - 1.00 mg/dL Final  94/87/7974 93:79 AM 7.04 (H) 0.44 - 1.00 mg/dL Final  94/98/7974 88:49 AM 6.59 (H) 0.44 - 1.00 mg/dL Final  95/70/7974 87:94 PM 6.32 (H) 0.44 - 1.00 mg/dL Final  95/70/7974 94:96 AM 6.10 (H) 0.44 - 1.00 mg/dL Final  95/70/7974 97:81 AM 6.35 (H) 0.44 - 1.00  mg/dL Final  95/71/7974 89:71 PM 6.00 (H) 0.44 - 1.00 mg/dL Final  95/83/7974 87:69 PM 8.25 (H) 0.44 - 1.00 mg/dL Final  96/72/7974 96:47 AM 8.26 (H) 0.44 - 1.00 mg/dL Final  96/74/7974 92:44 AM 6.70 (H) 0.44 - 1.00 mg/dL Final  96/75/7974 89:54 AM 5.63 (H) 0.44 - 1.00 mg/dL Final  96/76/7974 93:84 AM 9.36 (H) 0.44 - 1.00 mg/dL Final  96/77/7974 96:52 AM 8.96 (H) 0.44 - 1.00 mg/dL Final  96/78/7974 93:71 PM 9.00 (H) 0.44 - 1.00 mg/dL Final  96/78/7974 93:77 PM 8.52 (H) 0.44 - 1.00 mg/dL Final  96/81/7974 91:44 PM 7.54 (H) 0.44 - 1.00 mg/dL Final  96/81/7974 87:91 AM 6.55 (H) 0.44 - 1.00 mg/dL Final  96/86/7974 88:68 AM 8.77 (H) 0.44 - 1.00 mg/dL Final  96/89/7974 92:47 AM 6.02 (H) 0.44 - 1.00 mg/dL Final  96/90/7974 90:50 AM 5.06 (H) 0.44 - 1.00 mg/dL Final  96/91/7974  08:40 AM 7.36 (H) 0.44 - 1.00 mg/dL Final  96/92/7974 91:51 AM 6.29 (H) 0.44 - 1.00 mg/dL Final  96/93/7974 92:98 AM 7.92 (H) 0.44 - 1.00 mg/dL Final   Recent Labs  Lab 11/11/23 1623 11/11/23 1711 11/11/23 2153 11/12/23 0429 11/14/23 1305 11/17/23 1853  NA 137  136 142  --  142 135 134*  134*  K 5.5*  5.6* 4.3  --  5.2* 6.5* 6.3*  6.2*  CL 105 102  --  98 96* 99  CO2  --  26  --  26 22  --   GLUCOSE 79 90  --  130* 117* 219*  BUN 58* 42*  --  44* 63* 60*  CREATININE 7.00* 6.93*  --  7.15* 8.40* 8.00*  CALCIUM   --  8.5*  --  8.9 8.7*  --   PHOS  --   --  7.7* 8.2*  --   --    Recent Labs  Lab 11/11/23 1618 11/11/23 1623 11/11/23 2153 11/12/23 0429 11/14/23 1305 11/17/23 1853  WBC 11.0*  --  11.6* 11.3* 10.4  --   NEUTROABS 5.8  --   --   --   --   --   HGB 10.7*   < > 10.1* 10.7* 10.3* 11.9*  11.2*  HCT 34.7*   < > 32.9* 35.6* 34.6* 35.0*  33.0*  MCV 89.9  --  88.4 89.7 90.1  --   PLT 370  --  266 275 243  --    < > = values in this interval not displayed.   Liver Function Tests: Recent Labs  Lab 11/11/23 1711 11/12/23 0429 11/14/23 1305  AST 78*  --  151*  ALT 62*  --  99*  ALKPHOS  792*  --  1,261*  BILITOT 1.5*  --  1.9*  PROT 5.0*  --  5.6*  ALBUMIN  2.2* 2.4* 2.5*   Recent Labs  Lab 11/14/23 1305  LIPASE 25   No results for input(s): AMMONIA in the last 168 hours. Cardiac Enzymes: No results for input(s): CKTOTAL, CKMB, CKMBINDEX, TROPONINI in the last 168 hours. Iron  Studies: No results for input(s): IRON , TIBC, TRANSFERRIN, FERRITIN in the last 72 hours. PT/INR: @LABRCNTIP (inr:5)  Xrays/Other Studies: ) Results for orders placed or performed during the hospital encounter of 11/17/23 (from the past 48 hours)  I-Stat CG4 Lactic Acid     Status: None   Collection Time: 11/17/23  6:53 PM  Result Value Ref Range   Lactic Acid, Venous 1.7 0.5 - 1.9 mmol/L  I-Stat venous blood gas, ([facility] ED, [facility], [facility])     Status: Abnormal   Collection Time: 11/17/23  6:53 PM  Result Value Ref Range   pH, Ven 7.401 7.25 - 7.43   pCO2, Ven 42.8 (L) 44 - 60 mmHg   pO2, Ven 39 32 - 45 mmHg   Bicarbonate 26.6 20.0 - 28.0 mmol/L   TCO2 28 22 - 32 mmol/L   O2 Saturation 73 %   Acid-Base Excess 2.0 0.0 - 2.0 mmol/L   Sodium 134 (L) 135 - 145 mmol/L   Potassium 6.2 (H) 3.5 - 5.1 mmol/L   Calcium , Ion 1.04 (L) 1.15 - 1.40 mmol/L   HCT 33.0 (L) 36.0 - 46.0 %   Hemoglobin 11.2 (L) 12.0 - 15.0 g/dL   Sample type VENOUS    Comment NOTIFIED PHYSICIAN   I-stat chem 8, ED (not at [facility], [facility] or [facility])     Status: Abnormal   Collection  Time: 11/17/23  6:53 PM  Result Value Ref Range   Sodium 134 (L) 135 - 145 mmol/L   Potassium 6.3 (HH) 3.5 - 5.1 mmol/L   Chloride 99 98 - 111 mmol/L   BUN 60 (H) 6 - 20 mg/dL   Creatinine, Ser 1.99 (H) 0.44 - 1.00 mg/dL   Glucose, Bld 780 (H) 70 - 99 mg/dL    Comment: Glucose reference range applies only to samples taken after fasting for at least 8 hours.   Calcium , Ion 1.05 (L) 1.15 - 1.40 mmol/L   TCO2 26 22 - 32 mmol/L   Hemoglobin 11.9 (L) 12.0 - 15.0 g/dL   HCT 64.9 (L) 63.9 - 53.9 %   Comment NOTIFIED PHYSICIAN    DG Chest  Portable 1 View Result Date: 11/17/2023 EXAM: 1 VIEW XRAY OF THE CHEST 11/17/2023 06:33:00 PM COMPARISON: 11/11/2023 CXR CLINICAL HISTORY: Cough, abdominal pain and distention. Reports being at [facility] 2 days ago and being told something was wrong with her liver. Family reports patient came home Friday afternoon and has been asleep since then until approximately 1 hour ago when she woke up with abdominal pain. Endorses vomiting yesterday. Missed dialysis treatment yesterday. FINDINGS: LUNGS AND PLEURA: Mild interstitial pulmonary edema, grossly unchanged. No focal pulmonary opacity. No pleural effusion. No pneumothorax. HEART AND MEDIASTINUM: Cardiomegaly. No acute abnormality of the cardiac and mediastinal silhouettes. BONES AND SOFT TISSUES: No acute osseous abnormality. IMPRESSION: 1. Mild interstitial pulmonary edema, grossly unchanged. 2. Cardiomegaly. Electronically signed by: Pinkie Pebbles MD 11/17/2023 07:16 PM EDT RP Workstation: [id number]    PMH:   Past Medical History:  Diagnosis Date   Abscess, gluteal, right 08/24/2013   Anemia 02/19/2012   Bartholin's gland abscess 09/19/2013   Bipolar disorder (HCC)    BV (bacterial vaginosis) 11/24/2015   Depression    Diabetes mellitus type I (HCC) 2001   Diagnosed at age 64 ; Type I   Diarrhea 05/30/2016   DKA (diabetic ketoacidoses) 08/19/2013   Also in 2018   ESRD (end stage renal disease) (HCC)    Gonorrhea 08/2011   Treated in 09/2011   HFrEF (heart failure with reduced ejection fraction) (HCC)    a. 2022 Echo: EF 40%; b. 10/2021 Echo: EF 55%; b. 07/2022 MV: No ischemia. EF 31%; c. 08/2022 Echo: EF 35%, mildly dil RV, sev TR.   History of trichomoniasis 05/31/2016   Hyperlipidemia 03/28/2016   Hypertension    NICM (nonischemic cardiomyopathy) (HCC)    Sepsis (HCC) 09/19/2013    PSH:   Past Surgical History:  Procedure Laterality Date   A/V FISTULAGRAM Right 06/17/2023   Procedure: A/V Fistulagram;  Surgeon: Marea Selinda RAMAN,  MD;  Location: [facility] INVASIVE CV LAB;  Service: Cardiovascular;  Laterality: Right;   A/V SHUNT INTERVENTION N/A 09/25/2023   Procedure: A/V SHUNT INTERVENTION;  Surgeon: Pearline Norman RAMAN, MD;  Location: [facility] PV LAB;  Service: Cardiovascular;  Laterality: N/A;   AV FISTULA PLACEMENT Right 07/06/2022   Procedure: ARTERIOVENOUS GRAFT CREATION;  Surgeon: Gretta Lonni PARAS, MD;  Location: [facility] OR;  Service: Vascular;  Laterality: Right;   CESAREAN SECTION N/A 10/05/2019   Procedure: CESAREAN SECTION;  Surgeon: Izell Harari, MD;  Location: [facility] LD ORS;  Service: Obstetrics;  Laterality: N/A;   CHOLECYSTECTOMY N/A 07/02/2023   Procedure: LAPAROSCOPIC CHOLECYSTECTOMY;  Surgeon: Ebbie Cough, MD;  Location: [facility] OR;  Service: General;  Laterality: N/A;   INCISION AND DRAINAGE ABSCESS Left 09/28/2019   Procedure: INCISION AND DRAINAGE VULVAR ABCESS;  Surgeon: Edsel Norleen GAILS, MD;  Location: [facility] OR;  Service: Gynecology;  Laterality: Left;   INCISION AND DRAINAGE PERIRECTAL ABSCESS Right 08/18/2013   Procedure: IRRIGATION AND DEBRIDEMENT GLUTEAL ABSCESS;  Surgeon: Lynda Leos, MD;  Location: [facility] OR;  Service: General;  Laterality: Right;   INCISION AND DRAINAGE PERIRECTAL ABSCESS Right 09/19/2013   Procedure: IRRIGATION AND DEBRIDEMENT RIGHT GLUTEAL AND LABIAL ABSCESSES;  Surgeon: Lynda Leos, MD;  Location: [facility] OR;  Service: General;  Laterality: Right;   INCISION AND DRAINAGE PERIRECTAL ABSCESS Right 09/24/2013   Procedure: IRRIGATION AND DEBRIDEMENT PERIRECTAL ABSCESS;  Surgeon: Lynwood MALVA Pina, MD;  Location: [facility] OR;  Service: General;  Laterality: Right;   IR PARACENTESIS  08/28/2023   IR PARACENTESIS  11/04/2023    Allergies:  Allergies  Allergen Reactions   Cephalexin  Anaphylaxis and Other (See Comments)    Has gotten ceftriaxone  in the past    Morphine  Itching   Penicillins Hives and Rash   Fish Allergy Other (See Comments)    Allergic   Benadryl  [Diphenhydramine ] Itching    Dilaudid  [Hydromorphone ] Itching   Doxycycline  Itching   Methotrexate Derivatives Rash   Oxycodone  Itching    Medications:   Prior to Admission medications   Medication Sig Start Date End Date Taking? Authorizing Provider  albuterol  (VENTOLIN  HFA) 108 (90 Base) MCG/ACT inhaler Inhale 2 puffs into the lungs every 4 (four) hours as needed for wheezing or shortness of breath. Patient not taking: Reported on 11/02/2023 11/24/22   [provider]  atorvastatin  (LIPITOR ) 80 MG tablet Take 1 tablet (80 mg total) by mouth daily. 10/15/23   Danford, Lonni SQUIBB, MD  bumetanide  (BUMEX ) 2 MG tablet Take 2 mg by mouth daily.    [provider]  calcitRIOL  (ROCALTROL ) 0.25 MCG capsule Take 5 capsules (1.25 mcg total) by mouth every Tuesday, Thursday, and Saturday at 6 PM. 07/09/23   Cindy Garnette POUR, MD  carvedilol  (COREG ) 12.5 MG tablet Take 1 tablet (12.5 mg total) by mouth 2 (two) times daily with a meal. 10/29/23   Regalado, Belkys A, MD  Continuous Glucose Sensor (DEXCOM G7 SENSOR) MISC Change sensors every 10 days Patient taking differently: Inject 1 Device into the skin See admin instructions. Place 1 new sensor into the skin every 10 days 09/07/22   Shamleffer, Ibtehal Jaralla, MD  cyclobenzaprine  (FLEXERIL ) 5 MG tablet Take 1 tablet (5 mg total) by mouth 3 (three) times daily as needed for muscle spasms. 08/31/23   Ghimire, Donalda HERO, MD  dicyclomine  (BENTYL ) 20 MG tablet Take 20 mg by mouth 2 (two) times daily as needed for spasms.    [provider]  DULoxetine  (CYMBALTA ) 20 MG capsule Take 20 mg by mouth 2 (two) times daily. 07/29/23   [provider]  famotidine  (PEPCID ) 20 MG tablet Take 20 mg by mouth daily before breakfast. 07/29/23   [provider]  insulin  aspart (NOVOLOG ) 100 UNIT/ML injection Inject 5 Units into the skin 3 (three) times daily with meals.    [provider]  lamoTRIgine  (LAMICTAL ) 200 MG tablet Take 1 tablet (200 mg total) by  mouth daily. 10/29/23   Regalado, Belkys A, MD  LANTUS  SOLOSTAR 100 UNIT/ML Solostar Pen Inject 10 Units into the skin in the morning.    [provider]  losartan  (COZAAR ) 100 MG tablet Take 150 mg by mouth daily. 03/25/23   [provider]  metoCLOPramide  (REGLAN ) 10 MG tablet Take 1 tablet (10 mg total) by mouth every 8 (eight) hours  as needed for nausea. 10/14/23   Danford, Lonni SQUIBB, MD  OLANZapine  zydis (ZYPREXA ) 5 MG disintegrating tablet Take 5 mg by mouth at bedtime. 09/23/23 12/22/23  [provider]  ondansetron  (ZOFRAN -ODT) 4 MG disintegrating tablet Take 1 tablet (4 mg total) by mouth every 8 (eight) hours as needed for nausea or vomiting. 06/23/23   Davis, Jonathon H, MD  Pancrelipase , Lip-Prot-Amyl, 3000-9500 units CPEP Take 3,000 units of lipase by mouth 3 (three) times daily. 09/23/23 12/22/23  [provider]  sevelamer  carbonate (RENVELA ) 800 MG tablet Take 2,400 mg by mouth 3 (three) times daily with meals.    [provider]  sodium bicarbonate  650 MG tablet Take 1,300 mg by mouth 2 (two) times daily. 07/29/23   [provider]  SUMAtriptan  (IMITREX ) 50 MG tablet Take 50 mg by mouth every 2 (two) hours as needed for migraine or headache. 06/20/22   [provider]    Discontinued Meds:  There are no discontinued medications.  Social History:  reports that she has never smoked. She has never been exposed to tobacco smoke. She has never used smokeless tobacco. She reports that she does not currently use alcohol. She reports that she does not use drugs.  Family History:   Family History  Problem Relation Age of Onset   Asthma Mother    Carpal tunnel syndrome Mother    Gout Father    Diabetes Paternal Grandmother    Anesthesia problems Neg Hx     Blood pressure (!) 148/90, pulse 73, temperature 97.8 F (36.6 C), temperature source Oral, resp. rate 15, last menstrual period 01/29/2020, SpO2 100%. Gen: NAD,  comfortable, on RA, lying flat Respiratory: Clear bilateral, no rales Cardiovascular: Regular rate rhythm S1-S2 normal, no rubs GI: Abdomen soft, nontender,+distended Extremities: +  LE/ hip edema Neurology: Alert, awake, following commands RUA AVG +t/b       MELIA LYNWOOD ORN, MD 11/17/2023, 7:39 PM

## 2023-11-17 NOTE — Assessment & Plan Note (Addendum)
 Appreciate nephrology consult plan for hemodialysis tonight orders in place Last echo was just in end of May Fluid overload in the setting of noncompliance with hemodialysis

## 2023-11-17 NOTE — ED Triage Notes (Signed)
 Pt bib ems with reports of abdominal pain and distention. Reports being at [facility] 2 days ago and being told something was wrong with her liver. Family reports pt came home Friday afternoon and has been asleep since then until approx 1 hour ago when she woke up with abdominal pain. Endorses vomiting yesterday. Missed dialysis tx yesterday.  CBG 215 142/88 HR 72 95% RA

## 2023-11-17 NOTE — Assessment & Plan Note (Signed)
 Restarted Coreg  12.5 mg p.o. twice daily but hold off on Cozaar 

## 2023-11-17 NOTE — Subjective & Objective (Signed)
 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 Missed 2 dialysis secondary to lack of transportation. Was seen at [facility] 2 days ago Has had somewhat increased somnolence today Has been vomiting yesterday On arrival CBG 215 blood pressure 142 over 8895% room air found to have potassium of 6.3 nephrology consulted plan to dialyze today Patient has been having bilateral leg edema

## 2023-11-17 NOTE — Assessment & Plan Note (Signed)
-  will monitor on tele avoid QT prolonging medications, rehydrate correct electrolytes ? ?

## 2023-11-18 ENCOUNTER — Observation Stay (HOSPITAL_COMMUNITY)

## 2023-11-18 ENCOUNTER — Encounter (HOSPITAL_COMMUNITY)

## 2023-11-18 DIAGNOSIS — R1010 Upper abdominal pain, unspecified: Secondary | ICD-10-CM | POA: Diagnosis not present

## 2023-11-18 LAB — HEPATITIS PANEL, ACUTE
HCV Ab: NONREACTIVE
Hep A IgM: NONREACTIVE
Hep B C IgM: NONREACTIVE
Hepatitis B Surface Ag: NONREACTIVE

## 2023-11-18 LAB — COMPREHENSIVE METABOLIC PANEL WITH GFR
ALT: 84 U/L — ABNORMAL HIGH (ref 0–44)
AST: 77 U/L — ABNORMAL HIGH (ref 15–41)
Albumin: 2.8 g/dL — ABNORMAL LOW (ref 3.5–5.0)
Alkaline Phosphatase: 1260 U/L — ABNORMAL HIGH (ref 38–126)
Anion gap: 20 — ABNORMAL HIGH (ref 5–15)
BUN: 31 mg/dL — ABNORMAL HIGH (ref 6–20)
CO2: 26 mmol/L (ref 22–32)
Calcium: 9.1 mg/dL (ref 8.9–10.3)
Chloride: 91 mmol/L — ABNORMAL LOW (ref 98–111)
Creatinine, Ser: 5.05 mg/dL — ABNORMAL HIGH (ref 0.44–1.00)
GFR, Estimated: 11 mL/min — ABNORMAL LOW (ref >60)
Glucose, Bld: 156 mg/dL — ABNORMAL HIGH (ref 70–99)
Potassium: 4.3 mmol/L (ref 3.5–5.1)
Sodium: 137 mmol/L (ref 135–145)
Total Bilirubin: 4.6 mg/dL — ABNORMAL HIGH (ref 0.0–1.2)
Total Protein: 5.9 g/dL — ABNORMAL LOW (ref 6.5–8.1)

## 2023-11-18 LAB — PROCALCITONIN: Procalcitonin: 3.42 ng/mL

## 2023-11-18 LAB — CBG MONITORING, ED: Glucose-Capillary: 187 mg/dL — ABNORMAL HIGH (ref 70–99)

## 2023-11-18 LAB — CBC
HCT: 35.9 % — ABNORMAL LOW (ref 36.0–46.0)
Hemoglobin: 10.9 g/dL — ABNORMAL LOW (ref 12.0–15.0)
MCH: 26.8 pg (ref 26.0–34.0)
MCHC: 30.4 g/dL (ref 30.0–36.0)
MCV: 88.4 fL (ref 80.0–100.0)
Platelets: 334 K/uL (ref 150–400)
RBC: 4.06 MIL/uL (ref 3.87–5.11)
RDW: 16.1 % — ABNORMAL HIGH (ref 11.5–15.5)
WBC: 12 K/uL — ABNORMAL HIGH (ref 4.0–10.5)
nRBC: 1.2 % — ABNORMAL HIGH (ref 0.0–0.2)

## 2023-11-18 LAB — HEMOGLOBIN A1C
Hgb A1c MFr Bld: 11.2 % — ABNORMAL HIGH (ref 4.8–5.6)
Mean Plasma Glucose: 274.74 mg/dL

## 2023-11-18 LAB — IRON AND TIBC
Iron: 126 ug/dL (ref 28–170)
Saturation Ratios: 45 % — ABNORMAL HIGH (ref 10.4–31.8)
TIBC: 283 ug/dL (ref 250–450)
UIBC: 157 ug/dL

## 2023-11-18 LAB — FOLATE: Folate: 12.1 ng/mL (ref >5.9)

## 2023-11-18 LAB — VITAMIN B12: Vitamin B-12: 1177 pg/mL — ABNORMAL HIGH (ref 180–914)

## 2023-11-18 LAB — PHOSPHORUS: Phosphorus: 5.6 mg/dL — ABNORMAL HIGH (ref 2.5–4.6)

## 2023-11-18 LAB — PREALBUMIN: Prealbumin: 12 mg/dL — ABNORMAL LOW (ref 18–38)

## 2023-11-18 LAB — MAGNESIUM: Magnesium: 2.5 mg/dL — ABNORMAL HIGH (ref 1.7–2.4)

## 2023-11-18 LAB — CK: Total CK: 140 U/L (ref 38–234)

## 2023-11-18 LAB — FERRITIN: Ferritin: 2586 ng/mL — ABNORMAL HIGH (ref 11–307)

## 2023-11-18 MED ORDER — DULOXETINE HCL 20 MG PO CPEP: 20.0000 mg | ORAL_CAPSULE | Freq: Two times a day (BID) | ORAL | Status: DC

## 2023-11-18 MED ORDER — ALBUTEROL SULFATE (2.5 MG/3ML) 0.083% IN NEBU: 2.5000 mg | INHALATION_SOLUTION | RESPIRATORY_TRACT | Status: DC | PRN

## 2023-11-18 MED ORDER — GUAIFENESIN 100 MG/5ML PO LIQD: 5.0000 mL | ORAL | Status: DC | PRN

## 2023-11-18 MED ORDER — METOCLOPRAMIDE HCL 10 MG PO TABS: 10.0000 mg | ORAL_TABLET | Freq: Three times a day (TID) | ORAL | Status: DC | PRN

## 2023-11-18 MED ORDER — OLANZAPINE 5 MG PO TBDP: 5.0000 mg | ORAL_TABLET | Freq: Every day | ORAL | Status: DC

## 2023-11-18 MED ORDER — METOPROLOL TARTRATE 5 MG/5ML IV SOLN: 5.0000 mg | INTRAVENOUS | Status: DC | PRN

## 2023-11-18 MED ORDER — ONDANSETRON HCL 4 MG/2ML IJ SOLN: 4.0000 mg | Freq: Four times a day (QID) | INTRAMUSCULAR | Status: DC | PRN

## 2023-11-18 MED ORDER — HYDRALAZINE HCL 20 MG/ML IJ SOLN: 10.0000 mg | INTRAMUSCULAR | Status: DC | PRN

## 2023-11-18 MED ORDER — SEVELAMER CARBONATE 800 MG PO TABS
2400.0000 mg | ORAL_TABLET | Freq: Three times a day (TID) | ORAL | Status: DC
  Administered 2023-11-18: 2400 mg via ORAL
  Filled 2023-11-18: qty 3

## 2023-11-18 MED ORDER — IPRATROPIUM-ALBUTEROL 0.5-2.5 (3) MG/3ML IN SOLN: 3.0000 mL | RESPIRATORY_TRACT | Status: DC | PRN

## 2023-11-18 MED ORDER — ACETAMINOPHEN 325 MG PO TABS
650.0000 mg | ORAL_TABLET | Freq: Four times a day (QID) | ORAL | Status: DC | PRN
Start: 2023-11-18 — End: 2023-11-18
  Administered 2023-11-18: 650 mg via ORAL
  Filled 2023-11-18: qty 2

## 2023-11-18 MED ORDER — CALCITRIOL 0.5 MCG PO CAPS: 1.2500 ug | ORAL_CAPSULE | ORAL | Status: DC

## 2023-11-18 MED ORDER — FAMOTIDINE 20 MG PO TABS
20.0000 mg | ORAL_TABLET | Freq: Every day | ORAL | Status: DC
  Administered 2023-11-18: 20 mg via ORAL
  Filled 2023-11-18: qty 1

## 2023-11-18 MED ORDER — SENNOSIDES-DOCUSATE SODIUM 8.6-50 MG PO TABS: 1.0000 | ORAL_TABLET | Freq: Every evening | ORAL | Status: DC | PRN

## 2023-11-18 MED ORDER — LAMOTRIGINE 25 MG PO TABS: 200.0000 mg | ORAL_TABLET | Freq: Every day | ORAL | Status: DC

## 2023-11-18 MED ORDER — SODIUM CHLORIDE 0.9 % IV SOLN: INTRAVENOUS | Status: DC

## 2023-11-18 MED ORDER — INSULIN GLARGINE-YFGN 100 UNIT/ML ~~LOC~~ SOLN
10.0000 [IU] | Freq: Every day | SUBCUTANEOUS | Status: DC
  Administered 2023-11-18: 10 [IU] via SUBCUTANEOUS
  Filled 2023-11-18: qty 0.1

## 2023-11-18 MED ORDER — SODIUM BICARBONATE 650 MG PO TABS
1300.0000 mg | ORAL_TABLET | Freq: Two times a day (BID) | ORAL | Status: DC
  Administered 2023-11-18: 1300 mg via ORAL
  Filled 2023-11-18: qty 2

## 2023-11-18 MED ORDER — CARVEDILOL 12.5 MG PO TABS
12.5000 mg | ORAL_TABLET | Freq: Two times a day (BID) | ORAL | Status: DC
  Administered 2023-11-18: 12.5 mg via ORAL
  Filled 2023-11-18: qty 1

## 2023-11-18 MED ORDER — IBUPROFEN 400 MG PO TABS
400.0000 mg | ORAL_TABLET | Freq: Once | ORAL | Status: AC
  Administered 2023-11-18: 400 mg via ORAL
  Filled 2023-11-18: qty 1

## 2023-11-18 MED ORDER — ALBUTEROL SULFATE HFA 108 (90 BASE) MCG/ACT IN AERS: 2.0000 | INHALATION_SPRAY | RESPIRATORY_TRACT | Status: DC | PRN

## 2023-11-18 MED ORDER — ATORVASTATIN CALCIUM 40 MG PO TABS: 80.0000 mg | ORAL_TABLET | Freq: Every day | ORAL | Status: DC

## 2023-11-18 MED ORDER — ACETAMINOPHEN 650 MG RE SUPP: 650.0000 mg | Freq: Four times a day (QID) | RECTAL | Status: DC | PRN

## 2023-11-18 NOTE — Progress Notes (Signed)
 Late Note Entry- November 18, 2023 at 2:14 pm:  [employer] to be advised that pt left AMA today. D/C summary faxed to clinic for continuation of care.   Randine Mungo Renal Navigator [phone]

## 2023-11-18 NOTE — Progress Notes (Signed)
 Received patient in bed to unit.  Alert and oriented.  Informed consent signed and in chart.   TX duration:  Patient tolerated well.  Transported back to the room  Alert, without acute distress.  Hand-off given to patient's nurse.   Access used:graf ,  Access issues: no  Total UF removed: 2500 ml Medication(s) given: none Post HD VS: 162/1558  11/18/23 0445  Vitals  Temp 98 F (36.7 C)  Temp Source Oral  BP 125/65  MAP (mmHg) 82  BP Location Right Arm  BP Method Automatic  Patient Position (if appropriate) Lying  Pulse Rate 75  ECG Heart Rate 74  Resp (!) 30  Oxygen Therapy  SpO2 99 %  O2 Device Room Air  Patient Activity (if Appropriate) In bed  Pulse Oximetry Type Continuous  During Treatment Monitoring  Blood Flow Rate (mL/min) 0 mL/min  Arterial Pressure (mmHg) -0.4 mmHg  Venous Pressure (mmHg) -1.21 mmHg  TMP (mmHg) -49.09 mmHg  Ultrafiltration Rate (mL/min) 1423 mL/min  Dialysate Flow Rate (mL/min) 0 ml/min  Duration of HD Treatment -hour(s) 3.5 hour(s)  Cumulative Fluid Removed (mL) per Treatment  3500.18  Post Treatment  Dialyzer Clearance Lightly streaked  Hemodialysis Intake (mL) 0 mL  Liters Processed 74.1  Fluid Removed (mL) 3500 mL  Tolerated HD Treatment Yes  Post-Hemodialysis Comments HD tx achieved as expected, ptt is in bed resting  AVG/AVF Arterial Site Held (minutes) 10 minutes  AVG/AVF Venous Site Held (minutes) 10 minutes  Fistula / Graft Right Upper arm Arteriovenous vein graft  Placement Date/Time: 07/06/22 1013   Placed prior to admission: No  Orientation: Right  Access Location: Upper arm  Access Type: Arteriovenous vein graft  Site Condition No complications  Fistula / Graft Assessment Present;Thrill;Bruit  Status Deaccessed  Drainage Description None    Post HD weight:unable to obtain   [facility]

## 2023-11-18 NOTE — Discharge Summary (Signed)
 Physician Discharge Summary  VESNA KABLE FMW:[id number] DOB: Sep 16, 1990 DOA: 11/17/2023  PCP: Keven Crumbly Pap, MD  Admit date: 11/17/2023 Discharge date: 11/18/2023  Admitted From: Home Disposition: Home, left AMA  Recommendations for Outpatient Follow-up:  Left AGAINST MEDICAL ADVICE   Brief/Interim Summary: Brief Narrative:  33 year old with history of ESRD on HD TTS, bipolar 1 disorder, DM1, chronic CHF with reduced EF 35%, frequent rehospitalization, hypoglycemia comes with abdominal pain to the hospital.  Was seen at [facility] 2 days ago.  Upon admission potassium noted to be 6.3.  Nephrology consulted but the following day she was feeling better therefore left AGAINST MEDICAL ADVICE   Assessment & Plan:  Active Problems:   Acute on chronic combined systolic and diastolic CHF (congestive heart failure) (HCC)   Hyperkalemia   ESRD on hemodialysis (HCC)   Prolonged QT interval   Essential hypertension   Bipolar disorder (HCC)   DM (diabetes mellitus), type 1 with renal complications (HCC)   Leg edema, left   Elevated LFTs   History of seizure disorder    Acute congestive heart failure with reduced ejection fraction, 35% - Echocardiogram in May 2025 showed EF 35%, grade 2 DD - Nephrology consulted for volume optimization  History of ESRD, HD TTS Hyperkalemia - Patient is quite noncompliant.  Volume and electrolytes will be managed with hemodialysis - Was given a dose of Lokelma  - Continue Renvela , calcitriol , sodium bicarb  Essential hypertension - On Coreg .  IV as needed  Bipolar disorder - On Zyprexa  at bedtime.  Cymbalta  and Lamictal   Diabetes mellitus type 1 - Semglee  10 units daily.  Sliding scale and Accu-Cheks  Seizure disorder - Lamictal   DVT prophylaxis: SCDs Start: 11/18/23 0620    Code Status: Full Code Family Communication:   Left ama    Subjective:  Seen at bedside Wanting to leave AGAINST MEDICAL ADVICE before  nephrology evaluation She understands the risk  Examination:  General exam: Appears calm and comfortable  Respiratory system: Clear to auscultation. Respiratory effort normal. Cardiovascular system: S1 & S2 heard, RRR. No JVD, murmurs, rubs, gallops or clicks. No pedal edema. Gastrointestinal system: Abdomen is nondistended, soft and nontender. No organomegaly or masses felt. Normal bowel sounds heard. Central nervous system: Alert and oriented. No focal neurological deficits. Extremities: Symmetric 5 x 5 power. Skin: No rashes, lesions or ulcers Psychiatry: Judgement and insight appear normal. Mood & affect appropriate.    Discharge Diagnoses:  Active Problems:   Acute on chronic combined systolic and diastolic CHF (congestive heart failure) (HCC)   Hyperkalemia   ESRD on hemodialysis (HCC)   Prolonged QT interval   Essential hypertension   Bipolar disorder (HCC)   DM (diabetes mellitus), type 1 with renal complications (HCC)   Leg edema, left   Elevated LFTs   History of seizure disorder      Discharge Exam: Vitals:   11/18/23 1011 11/18/23 1015  BP: (!) 149/54 (!) 142/66  Pulse: 77 77  Resp: (!) 24 (!) 23  Temp: 98.4 F (36.9 C)   SpO2: 92% 95%   Vitals:   11/18/23 0645 11/18/23 0700 11/18/23 1011 11/18/23 1015  BP: (!) 162/93 (!) 171/96 (!) 149/54 (!) 142/66  Pulse: 77 75 77 77  Resp: (!) 22 19 (!) 24 (!) 23  Temp:   98.4 F (36.9 C)   TempSrc:   Oral   SpO2: 98% 98% 92% 95%      Discharge Instructions     Allergies  Allergen Reactions  Cephalexin  Anaphylaxis and Other (See Comments)    Has gotten ceftriaxone  in the past    Morphine  Itching   Penicillins Hives and Rash   Fish Allergy Other (See Comments)    Allergic   Benadryl  [Diphenhydramine ] Itching   Dilaudid  [Hydromorphone ] Itching   Doxycycline  Itching   Methotrexate Derivatives Rash   Oxycodone  Itching    You were cared for by a hospitalist during your hospital stay. If you have  any questions about your discharge medications or the care you received while you were in the hospital after you are discharged, you can call the unit and asked to speak with the hospitalist on call if the hospitalist that took care of you is not available. Once you are discharged, your primary care physician will handle any further medical issues. Please note that no refills for any discharge medications will be authorized once you are discharged, as it is imperative that you return to your primary care physician (or establish a relationship with a primary care physician if you do not have one) for your aftercare needs so that they can reassess your need for medications and monitor your lab values.  You were cared for by a hospitalist during your hospital stay. If you have any questions about your discharge medications or the care you received while you were in the hospital after you are discharged, you can call the unit and asked to speak with the hospitalist on call if the hospitalist that took care of you is not available. Once you are discharged, your primary care physician will handle any further medical issues. Please note that NO REFILLS for any discharge medications will be authorized once you are discharged, as it is imperative that you return to your primary care physician (or establish a relationship with a primary care physician if you do not have one) for your aftercare needs so that they can reassess your need for medications and monitor your lab values.  Please request your Prim.MD to go over all Hospital Tests and Procedure/Radiological results at the follow up, please get all Hospital records sent to your Prim MD by signing hospital release before you go home.  Get CBC, CMP, 2 view Chest X ray checked  by Primary MD during your next visit or SNF MD in 5-7 days ( we routinely change or add medications that can affect your baseline labs and fluid status, therefore we recommend that you get the  mentioned basic workup next visit with your PCP, your PCP may decide not to get them or add new tests based on their clinical decision)  On your next visit with your primary care physician please Get Medicines reviewed and adjusted.  If you experience worsening of your admission symptoms, develop shortness of breath, life threatening emergency, suicidal or homicidal thoughts you must seek medical attention immediately by calling 911 or calling your MD immediately  if symptoms less severe.  You Must read complete instructions/literature along with all the possible adverse reactions/side effects for all the Medicines you take and that have been prescribed to you. Take any new Medicines after you have completely understood and accpet all the possible adverse reactions/side effects.   Do not drive, operate heavy machinery, perform activities at heights, swimming or participation in water  activities or provide baby sitting services if your were admitted for syncope or siezures until you have seen by Primary MD or a Neurologist and advised to do so again.  Do not drive when taking Pain medications.  Procedures/Studies: DG Chest Portable 1 View Result Date: 11/17/2023 EXAM: 1 VIEW XRAY OF THE CHEST 11/17/2023 06:33:00 PM COMPARISON: 11/11/2023 CXR CLINICAL HISTORY: Cough, abdominal pain and distention. Reports being at [facility] 2 days ago and being told something was wrong with her liver. Family reports patient came home Friday afternoon and has been asleep since then until approximately 1 hour ago when she woke up with abdominal pain. Endorses vomiting yesterday. Missed dialysis treatment yesterday. FINDINGS: LUNGS AND PLEURA: Mild interstitial pulmonary edema, grossly unchanged. No focal pulmonary opacity. No pleural effusion. No pneumothorax. HEART AND MEDIASTINUM: Cardiomegaly. No acute abnormality of the cardiac and mediastinal silhouettes. BONES AND SOFT TISSUES: No acute osseous abnormality.  IMPRESSION: 1. Mild interstitial pulmonary edema, grossly unchanged. 2. Cardiomegaly. Electronically signed by: Pinkie Pebbles MD 11/17/2023 07:16 PM EDT RP Workstation: [id number]   CT Cervical Spine Wo Contrast Result Date: 11/11/2023 CLINICAL DATA:  Polytrauma, blunt EXAM: CT CERVICAL SPINE WITHOUT CONTRAST TECHNIQUE: Multidetector CT imaging of the cervical spine was performed without intravenous contrast. Multiplanar CT image reconstructions were also generated. RADIATION DOSE REDUCTION: This exam was performed according to the departmental dose-optimization program which includes automated exposure control, adjustment of the mA and/or kV according to patient size and/or use of iterative reconstruction technique. COMPARISON:  None Available. FINDINGS: Alignment: Mild reversal of the normal cervical lordosis and slight dextrocurvature. Skull base and vertebrae: Unremarkable.  No fracture or dislocation. Soft tissues and spinal canal: No soft tissue swelling or soft tissue injury evident. Disc levels: The disc spaces are well preserved and the spinal canal and neural foramina are widely patent. Upper chest: Clear. Other: None. IMPRESSION: Normal.  No evidence of acute traumatic injury. Electronically Signed   By: Evalene Coho M.D.   On: 11/11/2023 19:10   CT Head Wo Contrast Result Date: 11/11/2023 CLINICAL DATA:  Mental status change, unknown cause EXAM: CT HEAD WITHOUT CONTRAST TECHNIQUE: Contiguous axial images were obtained from the base of the skull through the vertex without intravenous contrast. RADIATION DOSE REDUCTION: This exam was performed according to the departmental dose-optimization program which includes automated exposure control, adjustment of the mA and/or kV according to patient size and/or use of iterative reconstruction technique. COMPARISON:  CT of the head dated October 31, 2023. FINDINGS: Brain: Normal brain. No evidence of hemorrhage, mass, acute cortical infarct or  hydrocephalus. Vascular: Negative. Skull: Intact and unremarkable. Sinuses/Orbits: Clear.  Status post bilateral lens replacement. Other: None. IMPRESSION: Normal. Electronically Signed   By: Evalene Coho M.D.   On: 11/11/2023 19:08   DG Pelvis Portable Result Date: 11/11/2023 CLINICAL DATA:  Hip pain. EXAM: PORTABLE PELVIS 1-2 VIEWS COMPARISON:  None Available. FINDINGS: There is no evidence of pelvic fracture or diastasis. No pelvic bone lesions are seen. IMPRESSION: Negative. Electronically Signed   By: Lynwood Landy Raddle M.D.   On: 11/11/2023 18:28   DG Chest Portable 1 View Result Date: 11/11/2023 CLINICAL DATA:  Altered mental status.  Hypoxia. EXAM: PORTABLE CHEST 1 VIEW COMPARISON:  11/02/2023 FINDINGS: Apical lordotic frontal radiograph. Moderate cardiomegaly. No pleural effusion or pneumothorax. Development of mild diffuse interstitial thickening. Developing bibasilar airspace disease. IMPRESSION: Cardiomegaly with new pulmonary interstitial thickening, favoring interstitial edema. Bibasilar airspace disease is most likely atelectasis. Early infection, especially in the left lower lobe, cannot be excluded. Consider PA and lateral radiographs if feasible. Electronically Signed   By: Rockey Kilts M.D.   On: 11/11/2023 16:52   CT ABDOMEN PELVIS W CONTRAST Result Date: 11/06/2023 CLINICAL DATA:  Diabetes  recent DKA abdominal distension and leg edema fluid overload EXAM: CT ABDOMEN AND PELVIS WITH CONTRAST TECHNIQUE: Multidetector CT imaging of the abdomen and pelvis was performed using the standard protocol following bolus administration of intravenous contrast. RADIATION DOSE REDUCTION: This exam was performed according to the departmental dose-optimization program which includes automated exposure control, adjustment of the mA and/or kV according to patient size and/or use of iterative reconstruction technique. CONTRAST:  60mL OMNIPAQUE  IOHEXOL  350 MG/ML SOLN COMPARISON:  CT 08/27/2023, 07/19/2023  FINDINGS: Lower chest: Lung bases demonstrate no acute airspace disease. Cardiomegaly. Trace right pleural effusion. Hepatobiliary: Liver is enlarged, craniocaudal measurement of 23 cm. Diffuse heterogenous liver parenchymal density. Cholecystectomy. Stable crescent shaped density the gallbladder fossa, may reflect a small chronic fluid collection or postsurgical change. No biliary dilatation Pancreas: Unremarkable. No pancreatic ductal dilatation or surrounding inflammatory changes. Spleen: Normal in size without focal abnormality. Adrenals/Urinary Tract: Adrenal glands are normal. Kidneys show no hydronephrosis. The bladder is diffusely thick walled but nearly empty Stomach/Bowel: Moderate debris in the stomach. Mild diffuse air distension of the small bowel without transition point. Moderate stool burden. Negative appendix. Vascular/Lymphatic: Nonaneurysmal aorta. Atherosclerosis. Multiple mildly prominent retroperitoneal nodes, less than a cm. Reproductive: Uterus and bilateral adnexa are unremarkable. Other: No free air. Moderate volume of ascites in the abdomen with moderate large pelvic ascites. Considerable subcutaneous edema. Large amount of fluid at the labia. Musculoskeletal: No acute osseous abnormality IMPRESSION: 1. Hepatomegaly with heterogenous appearance of the liver which could be secondary to passive congestion, or potential hepatic inflammation/hepatocellular disease. 2. Moderate volume of ascites in the abdomen with moderate to large pelvic ascites. Considerable subcutaneous edema consistent with anasarca. Large amount of fluid at the labia 3. Mild diffuse air distension of the small bowel without transition point, possible ileus. 4. Cardiomegaly. Trace right pleural effusion. Electronically Signed   By: Luke Bun M.D.   On: 11/06/2023 20:18   IR Paracentesis Result Date: 11/04/2023 INDICATION: End-stage renal disease with recurrent ascites. Request for diagnostic and therapeutic  paracentesis. EXAM: ULTRASOUND GUIDED PARACENTESIS MEDICATIONS: 1% lidocaine  10 mL COMPLICATIONS: None immediate. PROCEDURE: Informed written consent was obtained from the patient after a discussion of the risks, benefits and alternatives to treatment. A timeout was performed prior to the initiation of the procedure. Initial ultrasound scanning demonstrates a moderate amount of ascites within the left lateral abdomen. The left lateral abdomen was prepped and draped in the usual sterile fashion. 1% lidocaine  was used for local anesthesia. Following this, a 19 gauge, 7-cm, Yueh catheter was introduced. An ultrasound image was saved for documentation purposes. The paracentesis was performed. The catheter was removed and a dressing was applied. The patient tolerated the procedure well without immediate post procedural complication. FINDINGS: A total of approximately 1.5 L of clear yellow fluid was removed. Samples were sent to the laboratory as requested by the clinical team. IMPRESSION: Successful ultrasound-guided paracentesis yielding 1.5 liters of peritoneal fluid. Procedure performed by: Sari Lamp, PA-C Electronically Signed   By: Juliene Balder M.D.   On: 11/04/2023 15:01   US  ASCITES (ABDOMEN LIMITED) Result Date: 11/03/2023 CLINICAL DATA:  33 year old female with ascites. End stage renal disease on dialysis. 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 EXAM: LIMITED ABDOMEN ULTRASOUND FOR ASCITES TECHNIQUE: Limited ultrasound survey for ascites was performed in all four abdominal quadrants. COMPARISON:  CT Abdomen and Pelvis 08/27/2023 and earlier. FINDINGS: Grayscale images of all 4 quadrants demonstrate a moderate volume of ascites bilaterally. Appearance is very similar to the  ultrasound on 10/13/2023 which proceeded ultrasound-guided paracentesis of 2.7 liters of fluid that day. IMPRESSION: Moderate volume recurrent Ascites, similar to 10/13/2023 pre-paracentesis ultrasound.  Electronically Signed   By: VEAR Hurst M.D.   On: 11/03/2023 06:18   DG Chest 2 View Result Date: 11/02/2023 CLINICAL DATA:  Shortness of breath EXAM: CHEST - 2 VIEW COMPARISON:  10/31/2023 FINDINGS: Shallow inspiration. Cardiac enlargement. No vascular congestion, edema, or consolidation. No pleural effusion or pneumothorax. Mediastinal contours appear intact. No significant changes. IMPRESSION: Cardiac enlargement.  No evidence of active pulmonary disease. Electronically Signed   By: Elsie Gravely M.D.   On: 11/02/2023 23:35   CT Head Wo Contrast Result Date: 10/31/2023 CLINICAL DATA:  Clemens, hit head EXAM: CT HEAD WITHOUT CONTRAST TECHNIQUE: Contiguous axial images were obtained from the base of the skull through the vertex without intravenous contrast. RADIATION DOSE REDUCTION: This exam was performed according to the departmental dose-optimization program which includes automated exposure control, adjustment of the mA and/or kV according to patient size and/or use of iterative reconstruction technique. COMPARISON:  CT head 09/07/2022 FINDINGS: Brain: No intracranial hemorrhage, mass effect, or evidence of acute infarct. No hydrocephalus. No extra-axial fluid collection. Vascular: No hyperdense vessel or unexpected calcification. Skull: No fracture or focal lesion. Sinuses/Orbits: No acute finding. Other: None. IMPRESSION: No acute intracranial abnormality. Electronically Signed   By: Norman Gatlin M.D.   On: 10/31/2023 21:24   DG Knee Complete 4 Views Left Result Date: 10/31/2023 CLINICAL DATA:  Status post fall. EXAM: LEFT KNEE - COMPLETE 4+ VIEW COMPARISON:  None Available. FINDINGS: No evidence of fracture, dislocation, or joint effusion. No evidence of arthropathy or other focal bone abnormality. Diffuse soft tissue swelling is seen which is likely, in part, related to the patient's body habitus. IMPRESSION: No acute osseous abnormality. Electronically Signed   By: Suzen Dials M.D.   On:  10/31/2023 20:40   DG Chest 1 View Result Date: 10/31/2023 CLINICAL DATA:  Chest pain and shortness of breath. EXAM: CHEST  1 VIEW COMPARISON:  October 25, 2023 FINDINGS: The cardiac silhouette is enlarged and unchanged in size. Low lung volumes are noted. Both lungs are clear. The visualized skeletal structures are unremarkable. IMPRESSION: No active cardiopulmonary disease. Electronically Signed   By: Suzen Dials M.D.   On: 10/31/2023 20:37   IR ABDOMEN US  LIMITED Result Date: 10/28/2023 CLINICAL DATA:  33 year old female with a history of type 1 diabetes, end-stage renal disease on dialysis, hemorrhage current ascites. Request for therapeutic paracentesis. EXAM: ULTRASOUND ABDOMEN LIMITED COMPARISON:  None FINDINGS: Imaging of all 4 quadrants of the abdomen reveals a small amount of ascites. IMPRESSION: Small amount of ascites seen on ultrasound. After discussion of the risks versus benefits of the procedure the patient decided to defer paracentesis at this time. Performed by: Sherrilee Bal, PA-C under the supervision of Dr. KANDICE Moan Electronically Signed   By: Marcey Moan M.D.   On: 10/28/2023 11:35   DG Chest 2 View Result Date: 10/25/2023 CLINICAL DATA:  Chest pain. EXAM: CHEST - 2 VIEW COMPARISON:  10/23/2023. FINDINGS: Low lung volume. Complete clearing of previously noted pulmonary vascular congestion. Bilateral lung fields are essentially clear converting probable minimal scarring/atelectasis overlying the left lower lung zone, laterally. No acute consolidation or lung collapse. Bilateral costophrenic angles are clear. Stable mildly enlarged cardio-mediastinal silhouette, which is likely accentuated by low lung volume and AP technique. No acute osseous abnormalities. The soft tissues are within normal limits. IMPRESSION: No active cardiopulmonary disease. Electronically  Signed   By: Ree Molt M.D.   On: 10/25/2023 08:33   DG Chest 2 View Result Date: 10/23/2023 CLINICAL DATA:   Dyspnea, volume overload EXAM: CHEST - 2 VIEW COMPARISON:  10/18/2023 FINDINGS: Lung volumes are small, but are symmetric and are stable since prior examination. Progressive mild perihilar interstitial pulmonary infiltrate has developed in keeping with mild progressive interstitial pulmonary edema. No pneumothorax or pleural effusion. Cardiac size is mildly enlarged. No acute bone. IMPRESSION: 1. Pulmonary hypoinflation. 2. Mild progressive interstitial pulmonary edema, possibly cardiogenic. Electronically Signed   By: Dorethia Molt M.D.   On: 10/23/2023 02:41   DG Chest Port 1 View Result Date: 10/20/2023 CLINICAL DATA:  Altered mental status EXAM: PORTABLE CHEST 1 VIEW COMPARISON:  Chest x-ray 10/19/2023 FINDINGS: The heart is enlarged, unchanged. The lungs are clear. There is no pleural effusion or pneumothorax. No acute fractures are seen a IMPRESSION: No active disease. Electronically Signed   By: Greig Pique M.D.   On: 10/20/2023 23:32     The results of significant diagnostics from this hospitalization (including imaging, microbiology, ancillary and laboratory) are listed below for reference.     Microbiology: No results found for this or any previous visit (from the past 240 hours).   Labs: BNP (last 3 results) Recent Labs    10/12/23 0500 10/23/23 0310 10/25/23 0830  BNP >4,500.0* >4,500.0* >4,500.0*   Basic Metabolic Panel: Recent Labs  Lab 11/11/23 1711 11/11/23 2153 11/12/23 0429 11/14/23 1305 11/17/23 1853 11/17/23 1927 11/18/23 0630  NA 142  --  142 135 134*  134* 137 137  K 4.3  --  5.2* 6.5* 6.3*  6.2* 6.5* 4.3  CL 102  --  98 96* 99 95* 91*  CO2 26  --  26 22  --  20* 26  GLUCOSE 90  --  130* 117* 219* 233* 156*  BUN 42*  --  44* 63* 60* 68* 31*  CREATININE 6.93*  --  7.15* 8.40* 8.00* 8.20* 5.05*  CALCIUM  8.5*  --  8.9 8.7*  --  9.0 9.1  MG  --   --   --   --   --  3.1* 2.5*  PHOS  --  7.7* 8.2*  --   --   --  5.6*   Liver Function Tests: Recent Labs   Lab 11/11/23 1711 11/12/23 0429 11/14/23 1305 11/17/23 1927 11/18/23 0630  AST 78*  --  151* 71* 77*  ALT 62*  --  99* 78* 84*  ALKPHOS 792*  --  1,261* 1,155* 1,260*  BILITOT 1.5*  --  1.9* 4.3* 4.6*  PROT 5.0*  --  5.6* 5.4* 5.9*  ALBUMIN  2.2* 2.4* 2.5* 2.6* 2.8*   Recent Labs  Lab 11/14/23 1305 11/17/23 1927  LIPASE 25 16   Recent Labs  Lab 11/17/23 1927  AMMONIA 58*   CBC: Recent Labs  Lab 11/11/23 1618 11/11/23 1623 11/11/23 2153 11/12/23 0429 11/14/23 1305 11/17/23 1853 11/17/23 1927 11/18/23 0630  WBC 11.0*  --  11.6* 11.3* 10.4  --  13.4* 12.0*  NEUTROABS 5.8  --   --   --   --   --  8.1*  --   HGB 10.7*   < > 10.1* 10.7* 10.3* 11.9*  11.2* 10.2* 10.9*  HCT 34.7*   < > 32.9* 35.6* 34.6* 35.0*  33.0* 33.8* 35.9*  MCV 89.9  --  88.4 89.7 90.1  --  88.0 88.4  PLT 370  --  266  275 243  --  293 334   < > = values in this interval not displayed.   Cardiac Enzymes: Recent Labs  Lab 11/18/23 0822  CKTOTAL 140   BNP: Invalid input(s): POCBNP CBG: Recent Labs  Lab 11/12/23 0213 11/14/23 1244 11/17/23 2102 11/17/23 2322 11/18/23 0817  GLUCAP 118* 94 220* 146* 187*   D-Dimer No results for input(s): DDIMER in the last 72 hours. Hgb A1c No results for input(s): HGBA1C in the last 72 hours. Lipid Profile No results for input(s): CHOL, HDL, LDLCALC, TRIG, CHOLHDL, LDLDIRECT in the last 72 hours. Thyroid  function studies Recent Labs    11/17/23 1927  TSH 5.463*   Anemia work up Recent Labs    11/17/23 1927 11/18/23 0630 11/18/23 0822  VITAMINB12  --  1,177*  --   FOLATE  --  12.1  --   FERRITIN  --   --  2,586*  TIBC  --   --  283  IRON   --   --  126  RETICCTPCT 2.4  --   --    Urinalysis    Component Value Date/Time   COLORURINE YELLOW 10/25/2023 2300   APPEARANCEUR CLOUDY (A) 10/25/2023 2300   APPEARANCEUR Hazy 11/10/2013 2043   LABSPEC 1.016 10/25/2023 2300   LABSPEC 1.031 11/10/2013 2043   PHURINE 6.0  10/25/2023 2300   GLUCOSEU >=500 (A) 10/25/2023 2300   GLUCOSEU >=500 11/10/2013 2043   HGBUR SMALL (A) 10/25/2023 2300   BILIRUBINUR NEGATIVE 10/25/2023 2300   BILIRUBINUR negative 06/04/2018 1035   BILIRUBINUR Negative 11/24/2015 1443   BILIRUBINUR Negative 11/10/2013 2043   KETONESUR 5 (A) 10/25/2023 2300   PROTEINUR >=300 (A) 10/25/2023 2300   UROBILINOGEN 0.2 09/14/2019 1732   NITRITE NEGATIVE 10/25/2023 2300   LEUKOCYTESUR NEGATIVE 10/25/2023 2300   LEUKOCYTESUR 1+ 11/10/2013 2043   Sepsis Labs Recent Labs  Lab 11/12/23 0429 11/14/23 1305 11/17/23 1927 11/18/23 0630  WBC 11.3* 10.4 13.4* 12.0*   Microbiology No results found for this or any previous visit (from the past 240 hours).   Time coordinating discharge:  I have spent 35 minutes face to face with the patient and on the ward discussing the patients care, assessment, plan and disposition with other care givers. >50% of the time was devoted counseling the patient about the risks and benefits of treatment/Discharge disposition and coordinating care.   SIGNED:   Burgess JAYSON Dare, MD  [facility] 11/18/2023, 11:13 AM   If 7PM-7AM, please contact night-coverage

## 2023-11-18 NOTE — Hospital Course (Addendum)
 Brief Narrative:  33 year old with history of ESRD on HD TTS, bipolar 1 disorder, DM1, chronic CHF with reduced EF 35%, frequent rehospitalization, hypoglycemia comes with abdominal pain to the hospital.  Was seen at [facility] 2 days ago.  Upon admission potassium noted to be 6.3.  Nephrology consulted but the following day she was feeling better therefore left AGAINST MEDICAL ADVICE   Assessment & Plan:  Active Problems:   Acute on chronic combined systolic and diastolic CHF (congestive heart failure) (HCC)   Hyperkalemia   ESRD on hemodialysis (HCC)   Prolonged QT interval   Essential hypertension   Bipolar disorder (HCC)   DM (diabetes mellitus), type 1 with renal complications (HCC)   Leg edema, left   Elevated LFTs   History of seizure disorder    Acute congestive heart failure with reduced ejection fraction, 35% - Echocardiogram in May 2025 showed EF 35%, grade 2 DD - Nephrology consulted for volume optimization  History of ESRD, HD TTS Hyperkalemia - Patient is quite noncompliant.  Volume and electrolytes will be managed with hemodialysis - Was given a dose of Lokelma  - Continue Renvela , calcitriol , sodium bicarb  Essential hypertension - On Coreg .  IV as needed  Bipolar disorder - On Zyprexa  at bedtime.  Cymbalta  and Lamictal   Diabetes mellitus type 1 - Semglee  10 units daily.  Sliding scale and Accu-Cheks  Seizure disorder - Lamictal   DVT prophylaxis: SCDs Start: 11/18/23 0620    Code Status: Full Code Family Communication:   Left ama    Subjective:  Seen at bedside Wanting to leave AGAINST MEDICAL ADVICE before nephrology evaluation She understands the risk  Examination:  General exam: Appears calm and comfortable  Respiratory system: Clear to auscultation. Respiratory effort normal. Cardiovascular system: S1 & S2 heard, RRR. No JVD, murmurs, rubs, gallops or clicks. No pedal edema. Gastrointestinal system: Abdomen is nondistended, soft and  nontender. No organomegaly or masses felt. Normal bowel sounds heard. Central nervous system: Alert and oriented. No focal neurological deficits. Extremities: Symmetric 5 x 5 power. Skin: No rashes, lesions or ulcers Psychiatry: Judgement and insight appear normal. Mood & affect appropriate.

## 2023-11-18 NOTE — ED Notes (Signed)
 Provider came bedside to speak with patient who insists on leaving, provider advised to AMA her if she wants to leave. IV removed, AMA paperwork signed.

## 2023-11-18 NOTE — ED Notes (Signed)
 Patient refused to have vascular US  until she can speak with provider.

## 2023-11-18 NOTE — ED Notes (Signed)
 Patient unsatisfied with pain management, stating she wants to leave AMA. I advised I would have provider come talk to her.

## 2023-11-19 ENCOUNTER — Ambulatory Visit: Admitting: Student in an Organized Health Care Education/Training Program

## 2023-11-19 LAB — HEPATITIS B SURFACE ANTIBODY, QUANTITATIVE: Hep B S AB Quant (Post): <3.5 m[IU]/mL — ABNORMAL LOW

## 2023-11-25 ENCOUNTER — Ambulatory Visit (HOSPITAL_COMMUNITY)

## 2023-11-26 ENCOUNTER — Other Ambulatory Visit: Payer: Self-pay

## 2023-11-26 ENCOUNTER — Inpatient Hospital Stay (HOSPITAL_COMMUNITY)
Admission: EM | Admit: 2023-11-26 | Discharge: 2023-11-28 | DRG: 637 | Disposition: A | Discharge status: Home / Self Care | Attending: Internal Medicine | Admitting: Internal Medicine

## 2023-11-26 ENCOUNTER — Emergency Department (HOSPITAL_COMMUNITY)

## 2023-11-26 DIAGNOSIS — R188 Other ascites: Secondary | ICD-10-CM | POA: Diagnosis present

## 2023-11-26 DIAGNOSIS — Z794 Long term (current) use of insulin: Secondary | ICD-10-CM

## 2023-11-26 DIAGNOSIS — Z992 Dependence on renal dialysis: Secondary | ICD-10-CM | POA: Diagnosis not present

## 2023-11-26 DIAGNOSIS — I428 Other cardiomyopathies: Secondary | ICD-10-CM | POA: Diagnosis present

## 2023-11-26 DIAGNOSIS — Z5982 Transportation insecurity: Secondary | ICD-10-CM | POA: Diagnosis not present

## 2023-11-26 DIAGNOSIS — E1110 Type 2 diabetes mellitus with ketoacidosis without coma: Secondary | ICD-10-CM | POA: Diagnosis present

## 2023-11-26 DIAGNOSIS — N764 Abscess of vulva: Secondary | ICD-10-CM | POA: Diagnosis present

## 2023-11-26 DIAGNOSIS — Z79899 Other long term (current) drug therapy: Secondary | ICD-10-CM

## 2023-11-26 DIAGNOSIS — G40909 Epilepsy, unspecified, not intractable, without status epilepticus: Secondary | ICD-10-CM | POA: Diagnosis present

## 2023-11-26 DIAGNOSIS — Z881 Allergy status to other antibiotic agents status: Secondary | ICD-10-CM

## 2023-11-26 DIAGNOSIS — Z8619 Personal history of other infectious and parasitic diseases: Secondary | ICD-10-CM | POA: Diagnosis not present

## 2023-11-26 DIAGNOSIS — Z91148 Patient's other noncompliance with medication regimen for other reason: Secondary | ICD-10-CM | POA: Diagnosis not present

## 2023-11-26 DIAGNOSIS — E8770 Fluid overload, unspecified: Secondary | ICD-10-CM | POA: Diagnosis not present

## 2023-11-26 DIAGNOSIS — I132 Hypertensive heart and chronic kidney disease with heart failure and with stage 5 chronic kidney disease, or end stage renal disease: Secondary | ICD-10-CM | POA: Diagnosis present

## 2023-11-26 DIAGNOSIS — Z888 Allergy status to other drugs, medicaments and biological substances status: Secondary | ICD-10-CM

## 2023-11-26 DIAGNOSIS — Z5986 Financial insecurity: Secondary | ICD-10-CM

## 2023-11-26 DIAGNOSIS — Z833 Family history of diabetes mellitus: Secondary | ICD-10-CM

## 2023-11-26 DIAGNOSIS — Z91158 Patient's noncompliance with renal dialysis for other reason: Secondary | ICD-10-CM

## 2023-11-26 DIAGNOSIS — E785 Hyperlipidemia, unspecified: Secondary | ICD-10-CM | POA: Diagnosis present

## 2023-11-26 DIAGNOSIS — Z555 Less than a high school diploma: Secondary | ICD-10-CM

## 2023-11-26 DIAGNOSIS — Z885 Allergy status to narcotic agent status: Secondary | ICD-10-CM

## 2023-11-26 DIAGNOSIS — I5023 Acute on chronic systolic (congestive) heart failure: Secondary | ICD-10-CM | POA: Diagnosis present

## 2023-11-26 DIAGNOSIS — D631 Anemia in chronic kidney disease: Secondary | ICD-10-CM | POA: Diagnosis present

## 2023-11-26 DIAGNOSIS — E1010 Type 1 diabetes mellitus with ketoacidosis without coma: Secondary | ICD-10-CM | POA: Diagnosis present

## 2023-11-26 DIAGNOSIS — Z91013 Allergy to seafood: Secondary | ICD-10-CM

## 2023-11-26 DIAGNOSIS — N186 End stage renal disease: Secondary | ICD-10-CM | POA: Diagnosis present

## 2023-11-26 DIAGNOSIS — E1022 Type 1 diabetes mellitus with diabetic chronic kidney disease: Secondary | ICD-10-CM | POA: Diagnosis present

## 2023-11-26 DIAGNOSIS — Z9049 Acquired absence of other specified parts of digestive tract: Secondary | ICD-10-CM

## 2023-11-26 DIAGNOSIS — Z880 Allergy status to penicillin: Secondary | ICD-10-CM

## 2023-11-26 DIAGNOSIS — Z560 Unemployment, unspecified: Secondary | ICD-10-CM

## 2023-11-26 DIAGNOSIS — R739 Hyperglycemia, unspecified: Secondary | ICD-10-CM | POA: Diagnosis present

## 2023-11-26 DIAGNOSIS — N2581 Secondary hyperparathyroidism of renal origin: Secondary | ICD-10-CM | POA: Diagnosis present

## 2023-11-26 DIAGNOSIS — F319 Bipolar disorder, unspecified: Secondary | ICD-10-CM | POA: Diagnosis present

## 2023-11-26 LAB — CBC
HCT: 29.5 % — ABNORMAL LOW (ref 36.0–46.0)
Hemoglobin: 9 g/dL — ABNORMAL LOW (ref 12.0–15.0)
MCH: 27.5 pg (ref 26.0–34.0)
MCHC: 30.5 g/dL (ref 30.0–36.0)
MCV: 90.2 fL (ref 80.0–100.0)
Platelets: 238 K/uL (ref 150–400)
RBC: 3.27 MIL/uL — ABNORMAL LOW (ref 3.87–5.11)
RDW: 17.3 % — ABNORMAL HIGH (ref 11.5–15.5)
WBC: 10.1 K/uL (ref 4.0–10.5)
nRBC: 0 % (ref 0.0–0.2)

## 2023-11-26 LAB — COMPREHENSIVE METABOLIC PANEL WITH GFR
ALT: 47 U/L — ABNORMAL HIGH (ref 0–44)
ALT: 46 U/L — ABNORMAL HIGH (ref 0–44)
AST: 59 U/L — ABNORMAL HIGH (ref 15–41)
AST: 51 U/L — ABNORMAL HIGH (ref 15–41)
Albumin: 2.6 g/dL — ABNORMAL LOW (ref 3.5–5.0)
Albumin: 2.6 g/dL — ABNORMAL LOW (ref 3.5–5.0)
Alkaline Phosphatase: 882 U/L — ABNORMAL HIGH (ref 38–126)
Alkaline Phosphatase: 859 U/L — ABNORMAL HIGH (ref 38–126)
Anion gap: 18 — ABNORMAL HIGH (ref 5–15)
Anion gap: 19 — ABNORMAL HIGH (ref 5–15)
BUN: 46 mg/dL — ABNORMAL HIGH (ref 6–20)
BUN: 46 mg/dL — ABNORMAL HIGH (ref 6–20)
CO2: 18 mmol/L — ABNORMAL LOW (ref 22–32)
CO2: 15 mmol/L — ABNORMAL LOW (ref 22–32)
Calcium: 7.5 mg/dL — ABNORMAL LOW (ref 8.9–10.3)
Calcium: 7.8 mg/dL — ABNORMAL LOW (ref 8.9–10.3)
Chloride: 86 mmol/L — ABNORMAL LOW (ref 98–111)
Chloride: 94 mmol/L — ABNORMAL LOW (ref 98–111)
Creatinine, Ser: 8.31 mg/dL — ABNORMAL HIGH (ref 0.44–1.00)
Creatinine, Ser: 8.47 mg/dL — ABNORMAL HIGH (ref 0.44–1.00)
GFR, Estimated: 6 mL/min — ABNORMAL LOW (ref >60)
GFR, Estimated: 6 mL/min — ABNORMAL LOW (ref >60)
Glucose, Bld: 1132 mg/dL (ref 70–99)
Glucose, Bld: 841 mg/dL (ref 70–99)
Potassium: 3.9 mmol/L (ref 3.5–5.1)
Potassium: 3.4 mmol/L — ABNORMAL LOW (ref 3.5–5.1)
Sodium: 122 mmol/L — ABNORMAL LOW (ref 135–145)
Sodium: 128 mmol/L — ABNORMAL LOW (ref 135–145)
Total Bilirubin: 2 mg/dL — ABNORMAL HIGH (ref 0.0–1.2)
Total Bilirubin: 1.4 mg/dL — ABNORMAL HIGH (ref 0.0–1.2)
Total Protein: 5.5 g/dL — ABNORMAL LOW (ref 6.5–8.1)
Total Protein: 5.5 g/dL — ABNORMAL LOW (ref 6.5–8.1)

## 2023-11-26 LAB — CBC WITH DIFFERENTIAL/PLATELET
Abs Immature Granulocytes: 0.07 K/uL (ref 0.00–0.07)
Basophils Absolute: 0.1 K/uL (ref 0.0–0.1)
Basophils Relative: 1 %
Eosinophils Absolute: 0.5 K/uL (ref 0.0–0.5)
Eosinophils Relative: 5 %
HCT: 32.9 % — ABNORMAL LOW (ref 36.0–46.0)
Hemoglobin: 9.4 g/dL — ABNORMAL LOW (ref 12.0–15.0)
Immature Granulocytes: 1 %
Lymphocytes Relative: 19 %
Lymphs Abs: 1.9 K/uL (ref 0.7–4.0)
MCH: 27.6 pg (ref 26.0–34.0)
MCHC: 28.6 g/dL — ABNORMAL LOW (ref 30.0–36.0)
MCV: 96.8 fL (ref 80.0–100.0)
Monocytes Absolute: 0.7 K/uL (ref 0.1–1.0)
Monocytes Relative: 6 %
Neutro Abs: 6.9 K/uL (ref 1.7–7.7)
Neutrophils Relative %: 68 %
Platelets: 265 K/uL (ref 150–400)
RBC: 3.4 MIL/uL — ABNORMAL LOW (ref 3.87–5.11)
RDW: 17.3 % — ABNORMAL HIGH (ref 11.5–15.5)
WBC: 10.1 K/uL (ref 4.0–10.5)
nRBC: 0 % (ref 0.0–0.2)

## 2023-11-26 LAB — I-STAT CHEM 8, ED
BUN: 41 mg/dL — ABNORMAL HIGH (ref 6–20)
Calcium, Ion: 0.99 mmol/L — ABNORMAL LOW (ref 1.15–1.40)
Chloride: 90 mmol/L — ABNORMAL LOW (ref 98–111)
Creatinine, Ser: 7.9 mg/dL — ABNORMAL HIGH (ref 0.44–1.00)
Glucose, Bld: >700 mg/dL (ref 70–99)
HCT: 33 % — ABNORMAL LOW (ref 36.0–46.0)
Hemoglobin: 11.2 g/dL — ABNORMAL LOW (ref 12.0–15.0)
Potassium: 3.9 mmol/L (ref 3.5–5.1)
Sodium: 124 mmol/L — ABNORMAL LOW (ref 135–145)
TCO2: 21 mmol/L — ABNORMAL LOW (ref 22–32)

## 2023-11-26 LAB — CBG MONITORING, ED
Glucose-Capillary: >600 mg/dL (ref 70–99)
Glucose-Capillary: >600 mg/dL (ref 70–99)
Glucose-Capillary: >600 mg/dL (ref 70–99)
Glucose-Capillary: >600 mg/dL (ref 70–99)
Glucose-Capillary: >600 mg/dL (ref 70–99)
Glucose-Capillary: >600 mg/dL (ref 70–99)
Glucose-Capillary: 297 mg/dL — ABNORMAL HIGH (ref 70–99)
Glucose-Capillary: 144 mg/dL — ABNORMAL HIGH (ref 70–99)
Glucose-Capillary: 162 mg/dL — ABNORMAL HIGH (ref 70–99)
Glucose-Capillary: 235 mg/dL — ABNORMAL HIGH (ref 70–99)
Glucose-Capillary: 183 mg/dL — ABNORMAL HIGH (ref 70–99)
Glucose-Capillary: 180 mg/dL — ABNORMAL HIGH (ref 70–99)

## 2023-11-26 LAB — I-STAT VENOUS BLOOD GAS, ED
Acid-base deficit: 7 mmol/L — ABNORMAL HIGH (ref 0.0–2.0)
Bicarbonate: 16.9 mmol/L — ABNORMAL LOW (ref 20.0–28.0)
Calcium, Ion: 1 mmol/L — ABNORMAL LOW (ref 1.15–1.40)
HCT: 32 % — ABNORMAL LOW (ref 36.0–46.0)
Hemoglobin: 10.9 g/dL — ABNORMAL LOW (ref 12.0–15.0)
O2 Saturation: 99 %
Potassium: 3.4 mmol/L — ABNORMAL LOW (ref 3.5–5.1)
Sodium: 127 mmol/L — ABNORMAL LOW (ref 135–145)
TCO2: 18 mmol/L — ABNORMAL LOW (ref 22–32)
pCO2, Ven: 27.5 mmHg — ABNORMAL LOW (ref 44–60)
pH, Ven: 7.396 (ref 7.25–7.43)
pO2, Ven: 123 mmHg — ABNORMAL HIGH (ref 32–45)

## 2023-11-26 LAB — BASIC METABOLIC PANEL WITH GFR
Anion gap: 14 (ref 5–15)
BUN: 50 mg/dL — ABNORMAL HIGH (ref 6–20)
CO2: 19 mmol/L — ABNORMAL LOW (ref 22–32)
Calcium: 8.1 mg/dL — ABNORMAL LOW (ref 8.9–10.3)
Chloride: 96 mmol/L — ABNORMAL LOW (ref 98–111)
Creatinine, Ser: 8.23 mg/dL — ABNORMAL HIGH (ref 0.44–1.00)
GFR, Estimated: 6 mL/min — ABNORMAL LOW (ref >60)
Glucose, Bld: 195 mg/dL — ABNORMAL HIGH (ref 70–99)
Potassium: 4.3 mmol/L (ref 3.5–5.1)
Sodium: 129 mmol/L — ABNORMAL LOW (ref 135–145)

## 2023-11-26 LAB — I-STAT CG4 LACTIC ACID, ED: Lactic Acid, Venous: 2 mmol/L (ref 0.5–1.9)

## 2023-11-26 LAB — HCG, SERUM, QUALITATIVE: Preg, Serum: NEGATIVE

## 2023-11-26 LAB — BRAIN NATRIURETIC PEPTIDE: B Natriuretic Peptide: >4500 pg/mL — ABNORMAL HIGH (ref 0.0–100.0)

## 2023-11-26 LAB — LIPASE, BLOOD: Lipase: 30 U/L (ref 11–51)

## 2023-11-26 LAB — TROPONIN I (HIGH SENSITIVITY)
Troponin I (High Sensitivity): 18 ng/L — ABNORMAL HIGH (ref <18)
Troponin I (High Sensitivity): 19 ng/L — ABNORMAL HIGH (ref <18)

## 2023-11-26 MED ORDER — SULFAMETHOXAZOLE-TRIMETHOPRIM 400-80 MG PO TABS
1.0000 | ORAL_TABLET | Freq: Two times a day (BID) | ORAL | Status: DC
  Administered 2023-11-26: 1 via ORAL
  Filled 2023-11-26 (×2): qty 1

## 2023-11-26 MED ORDER — POTASSIUM CHLORIDE CRYS ER 20 MEQ PO TBCR
40.0000 meq | EXTENDED_RELEASE_TABLET | Freq: Once | ORAL | Status: AC
  Administered 2023-11-26: 40 meq via ORAL
  Filled 2023-11-26: qty 2

## 2023-11-26 MED ORDER — HYDROMORPHONE HCL 2 MG PO TABS
2.0000 mg | ORAL_TABLET | Freq: Four times a day (QID) | ORAL | Status: DC | PRN
  Administered 2023-11-26 – 2023-11-27 (×3): 2 mg via ORAL
  Filled 2023-11-26 (×3): qty 1

## 2023-11-26 MED ORDER — METOCLOPRAMIDE HCL 5 MG PO TABS
10.0000 mg | ORAL_TABLET | Freq: Three times a day (TID) | ORAL | Status: DC | PRN
  Administered 2023-11-26: 10 mg via ORAL
  Filled 2023-11-26: qty 1

## 2023-11-26 MED ORDER — DEXTROSE IN LACTATED RINGERS 5 % IV SOLN: INTRAVENOUS | Status: DC

## 2023-11-26 MED ORDER — LACTATED RINGERS IV SOLN: INTRAVENOUS | Status: DC

## 2023-11-26 MED ORDER — CHLORHEXIDINE GLUCONATE CLOTH 2 % EX PADS
6.0000 | MEDICATED_PAD | Freq: Every day | CUTANEOUS | Status: DC
  Administered 2023-11-27 – 2023-11-28 (×2): 6 via TOPICAL

## 2023-11-26 MED ORDER — ACETAMINOPHEN 325 MG PO TABS: 650.0000 mg | ORAL_TABLET | Freq: Four times a day (QID) | ORAL | Status: DC | PRN

## 2023-11-26 MED ORDER — FENTANYL CITRATE PF 50 MCG/ML IJ SOSY
50.0000 ug | PREFILLED_SYRINGE | Freq: Once | INTRAMUSCULAR | Status: AC
  Administered 2023-11-26: 50 ug via INTRAVENOUS
  Filled 2023-11-26: qty 1

## 2023-11-26 MED ORDER — LACTATED RINGERS IV BOLUS: 20.0000 mL/kg | Freq: Once | INTRAVENOUS | Status: DC

## 2023-11-26 MED ORDER — FAMOTIDINE 20 MG PO TABS
20.0000 mg | ORAL_TABLET | Freq: Every day | ORAL | Status: DC
  Administered 2023-11-26 – 2023-11-28 (×2): 20 mg via ORAL
  Filled 2023-11-26 (×2): qty 1

## 2023-11-26 MED ORDER — ALBUTEROL SULFATE (2.5 MG/3ML) 0.083% IN NEBU: 2.5000 mg | INHALATION_SOLUTION | RESPIRATORY_TRACT | Status: DC | PRN

## 2023-11-26 MED ORDER — LAMOTRIGINE 100 MG PO TABS
200.0000 mg | ORAL_TABLET | Freq: Every day | ORAL | Status: DC
  Administered 2023-11-26 – 2023-11-28 (×3): 200 mg via ORAL
  Filled 2023-11-26 (×2): qty 2
  Filled 2023-11-26: qty 8

## 2023-11-26 MED ORDER — SODIUM CHLORIDE 0.9 % IV BOLUS: 1000.0000 mL | Freq: Once | INTRAVENOUS | Status: DC

## 2023-11-26 MED ORDER — DICYCLOMINE HCL 20 MG PO TABS: 20.0000 mg | ORAL_TABLET | Freq: Two times a day (BID) | ORAL | Status: DC | PRN

## 2023-11-26 MED ORDER — SEVELAMER CARBONATE 800 MG PO TABS
2400.0000 mg | ORAL_TABLET | Freq: Three times a day (TID) | ORAL | Status: DC
Start: 2023-11-26 — End: 2023-11-28
  Administered 2023-11-26 – 2023-11-28 (×6): 2400 mg via ORAL
  Filled 2023-11-26 (×6): qty 3

## 2023-11-26 MED ORDER — BACITRACIN ZINC 500 UNIT/GM EX OINT
TOPICAL_OINTMENT | Freq: Two times a day (BID) | CUTANEOUS | Status: DC
  Administered 2023-11-26 (×2): 1 via TOPICAL
  Filled 2023-11-26 (×2): qty 0.9

## 2023-11-26 MED ORDER — INSULIN ASPART 100 UNIT/ML IJ SOLN
10.0000 [IU] | Freq: Once | INTRAMUSCULAR | Status: AC
  Administered 2023-11-26: 10 [IU] via INTRAVENOUS

## 2023-11-26 MED ORDER — SODIUM BICARBONATE 650 MG PO TABS
1300.0000 mg | ORAL_TABLET | Freq: Two times a day (BID) | ORAL | Status: DC
  Administered 2023-11-26 – 2023-11-28 (×5): 1300 mg via ORAL
  Filled 2023-11-26 (×5): qty 2

## 2023-11-26 MED ORDER — INSULIN GLARGINE-YFGN 100 UNIT/ML ~~LOC~~ SOLN
5.0000 [IU] | Freq: Every day | SUBCUTANEOUS | Status: DC
  Administered 2023-11-26 – 2023-11-28 (×3): 5 [IU] via SUBCUTANEOUS
  Filled 2023-11-26 (×3): qty 0.05

## 2023-11-26 MED ORDER — OLANZAPINE 5 MG PO TBDP
5.0000 mg | ORAL_TABLET | Freq: Every day | ORAL | Status: DC
  Administered 2023-11-26 – 2023-11-27 (×2): 5 mg via ORAL
  Filled 2023-11-26 (×3): qty 1

## 2023-11-26 MED ORDER — DEXTROSE 50 % IV SOLN: 0.0000 mL | INTRAVENOUS | Status: DC | PRN

## 2023-11-26 MED ORDER — ATORVASTATIN CALCIUM 80 MG PO TABS
80.0000 mg | ORAL_TABLET | Freq: Every day | ORAL | Status: DC
  Administered 2023-11-26 – 2023-11-28 (×3): 80 mg via ORAL
  Filled 2023-11-26: qty 2
  Filled 2023-11-26 (×2): qty 1

## 2023-11-26 MED ORDER — INSULIN REGULAR(HUMAN) IN NACL 100-0.9 UT/100ML-% IV SOLN
INTRAVENOUS | Status: DC
  Administered 2023-11-26: 6.5 [IU]/h via INTRAVENOUS
  Filled 2023-11-26: qty 100

## 2023-11-26 MED ORDER — SODIUM CHLORIDE 0.9 % IV BOLUS
1000.0000 mL | Freq: Once | INTRAVENOUS | Status: AC
  Administered 2023-11-26: 1000 mL via INTRAVENOUS

## 2023-11-26 MED ORDER — LOSARTAN POTASSIUM 50 MG PO TABS
150.0000 mg | ORAL_TABLET | Freq: Every day | ORAL | Status: DC
  Administered 2023-11-26 – 2023-11-28 (×3): 150 mg via ORAL
  Filled 2023-11-26 (×3): qty 3

## 2023-11-26 MED ORDER — INSULIN ASPART 100 UNIT/ML IJ SOLN
0.0000 [IU] | Freq: Three times a day (TID) | INTRAMUSCULAR | Status: DC
  Administered 2023-11-26: 2 [IU] via SUBCUTANEOUS
  Administered 2023-11-27 – 2023-11-28 (×2): 1 [IU] via SUBCUTANEOUS

## 2023-11-26 MED ORDER — CALCITRIOL 0.5 MCG PO CAPS
1.2500 ug | ORAL_CAPSULE | ORAL | Status: DC
  Administered 2023-11-26: 1.25 ug via ORAL
  Filled 2023-11-26: qty 1

## 2023-11-26 MED ORDER — CARVEDILOL 12.5 MG PO TABS
12.5000 mg | ORAL_TABLET | Freq: Two times a day (BID) | ORAL | Status: DC
  Administered 2023-11-26 – 2023-11-28 (×4): 12.5 mg via ORAL
  Filled 2023-11-26 (×4): qty 1

## 2023-11-26 MED ORDER — DULOXETINE HCL 20 MG PO CPEP
20.0000 mg | ORAL_CAPSULE | Freq: Two times a day (BID) | ORAL | Status: DC
  Administered 2023-11-26 – 2023-11-28 (×5): 20 mg via ORAL
  Filled 2023-11-26 (×5): qty 1

## 2023-11-26 MED ORDER — INSULIN ASPART 100 UNIT/ML IJ SOLN
0.0000 [IU] | Freq: Every day | INTRAMUSCULAR | Status: DC
  Administered 2023-11-27: 2 [IU] via SUBCUTANEOUS

## 2023-11-26 MED ORDER — HEPARIN SODIUM (PORCINE) 5000 UNIT/ML IJ SOLN
5000.0000 [IU] | Freq: Three times a day (TID) | INTRAMUSCULAR | Status: DC
  Administered 2023-11-27 – 2023-11-28 (×3): 5000 [IU] via SUBCUTANEOUS
  Filled 2023-11-26 (×4): qty 1

## 2023-11-26 MED ORDER — PANCRELIPASE (LIP-PROT-AMYL) 12000-38000 UNITS PO CPEP
12000.0000 [IU] | ORAL_CAPSULE | Freq: Three times a day (TID) | ORAL | Status: DC
  Administered 2023-11-26 – 2023-11-28 (×6): 12000 [IU] via ORAL
  Filled 2023-11-26 (×9): qty 1

## 2023-11-26 NOTE — ED Notes (Signed)
 Pt complains of generalized pain all over, 9/10. MD notified.

## 2023-11-26 NOTE — H&P (Signed)
 History and Physical    Ashley Freeman FMW:[id number] DOB: 1990-07-24 DOA: 11/26/2023  PCP: Keven Crumbly Pap, MD  Patient coming from: home  I have personally briefly reviewed patient's old medical records in [facility]  Chief Complaint: hyperglycemia, abdominal distention  HPI: Ashley Freeman is a 33 y.o. female with medical history significant of ESRD on HD TTS, seizure d/o, bipolar 1 disorder, DM1, chronic CHF with reduced EF 35%, frequent rehospitalization, hypoglycemia who has interim history of admission 7/20-7/21 at which time she was diagnosed with acute on chronic CHF , hyperkalemia who signed out against medical advice( as she had no childcare). Patient now returns BIB EMS due to elevated sugars at home as well as increasing abdominal distention. Patient also noted painful boil on labia.    ED Course:  On evaluation in ED patient was found to be in DKA  Vitals  Wbc 10.1, hgb 9.4, plt 265,  Bnp >4500 Na 122, bicarb 18, Glucose 1132, cr 8.31,  ast 59, alt 47, alphos 882, tbili 2.0 (chronic elevation of lfts) CE18 EKG :NSR Lactic 2.0 Cxr  IMPRESSION: Cardiomegaly.  No active disease.   Tx insulin  drip, ns 1L,  LR 1,416  Review of Systems: As per HPI otherwise 10 point review of systems negative.   Past Medical History:  Diagnosis Date   Abscess, gluteal, right 08/24/2013   Anemia 02/19/2012   Bartholin's gland abscess 09/19/2013   Bipolar disorder (HCC)    BV (bacterial vaginosis) 11/24/2015   Depression    Diabetes mellitus type I (HCC) 2001   Diagnosed at age 51 ; Type I   Diarrhea 05/30/2016   DKA (diabetic ketoacidoses) 08/19/2013   Also in 2018   ESRD (end stage renal disease) (HCC)    Gonorrhea 08/2011   Treated in 09/2011   HFrEF (heart failure with reduced ejection fraction) (HCC)    a. 2022 Echo: EF 40%; b. 10/2021 Echo: EF 55%; b. 07/2022 MV: No ischemia. EF 31%; c. 08/2022 Echo: EF 35%, mildly dil RV, sev TR.   History  of trichomoniasis 05/31/2016   Hyperlipidemia 03/28/2016   Hypertension    NICM (nonischemic cardiomyopathy) (HCC)    Sepsis (HCC) 09/19/2013    Past Surgical History:  Procedure Laterality Date   A/V FISTULAGRAM Right 06/17/2023   Procedure: A/V Fistulagram;  Surgeon: Marea Selinda GORMAN, MD;  Location: [facility] INVASIVE CV LAB;  Service: Cardiovascular;  Laterality: Right;   A/V SHUNT INTERVENTION N/A 09/25/2023   Procedure: A/V SHUNT INTERVENTION;  Surgeon: Pearline Norman GORMAN, MD;  Location: [facility] PV LAB;  Service: Cardiovascular;  Laterality: N/A;   AV FISTULA PLACEMENT Right 07/06/2022   Procedure: ARTERIOVENOUS GRAFT CREATION;  Surgeon: Gretta Lonni PARAS, MD;  Location: [facility] OR;  Service: Vascular;  Laterality: Right;   CESAREAN SECTION N/A 10/05/2019   Procedure: CESAREAN SECTION;  Surgeon: Izell Harari, MD;  Location: [facility] LD ORS;  Service: Obstetrics;  Laterality: N/A;   CHOLECYSTECTOMY N/A 07/02/2023   Procedure: LAPAROSCOPIC CHOLECYSTECTOMY;  Surgeon: Ebbie Cough, MD;  Location: [facility] OR;  Service: General;  Laterality: N/A;   INCISION AND DRAINAGE ABSCESS Left 09/28/2019   Procedure: INCISION AND DRAINAGE VULVAR ABCESS;  Surgeon: Edsel Norleen GAILS, MD;  Location: [facility] OR;  Service: Gynecology;  Laterality: Left;   INCISION AND DRAINAGE PERIRECTAL ABSCESS Right 08/18/2013   Procedure: IRRIGATION AND DEBRIDEMENT GLUTEAL ABSCESS;  Surgeon: Lynda Leos, MD;  Location: [facility] OR;  Service: General;  Laterality: Right;   INCISION AND DRAINAGE PERIRECTAL ABSCESS Right  09/19/2013   Procedure: IRRIGATION AND DEBRIDEMENT RIGHT GLUTEAL AND LABIAL ABSCESSES;  Surgeon: Lynda Leos, MD;  Location: [facility] OR;  Service: General;  Laterality: Right;   INCISION AND DRAINAGE PERIRECTAL ABSCESS Right 09/24/2013   Procedure: IRRIGATION AND DEBRIDEMENT PERIRECTAL ABSCESS;  Surgeon: Lynwood MALVA Pina, MD;  Location: [facility] OR;  Service: General;  Laterality: Right;   IR PARACENTESIS  08/28/2023   IR PARACENTESIS   11/04/2023     reports that she has never smoked. She has never been exposed to tobacco smoke. She has never used smokeless tobacco. She reports that she does not currently use alcohol. She reports that she does not use drugs.  Allergies  Allergen Reactions   Cephalexin  Anaphylaxis and Other (See Comments)    Has gotten ceftriaxone  in the past    Penicillins Hives and Rash   Fish Allergy Other (See Comments)    Allergic   Benadryl  [Diphenhydramine ] Itching   Dilaudid  [Hydromorphone ] Itching   Doxycycline  Itching   Methotrexate Derivatives Rash   Oxycodone  Itching    Family History  Problem Relation Age of Onset   Asthma Mother    Carpal tunnel syndrome Mother    Gout Father    Diabetes Paternal Grandmother    Anesthesia problems Neg Hx     Prior to Admission medications   Medication Sig Start Date End Date Taking? Authorizing Provider  albuterol  (VENTOLIN  HFA) 108 (90 Base) MCG/ACT inhaler Inhale 2 puffs into the lungs every 4 (four) hours as needed for wheezing or shortness of breath. 11/24/22   [provider]  atorvastatin  (LIPITOR ) 80 MG tablet Take 1 tablet (80 mg total) by mouth daily. 10/15/23   Danford, Lonni SQUIBB, MD  bumetanide  (BUMEX ) 2 MG tablet Take 2 mg by mouth daily.    [provider]  calcitRIOL  (ROCALTROL ) 0.25 MCG capsule Take 5 capsules (1.25 mcg total) by mouth every Tuesday, Thursday, and Saturday at 6 PM. 07/09/23   Cindy Garnette POUR, MD  carvedilol  (COREG ) 12.5 MG tablet Take 1 tablet (12.5 mg total) by mouth 2 (two) times daily with a meal. 10/29/23   Regalado, Belkys A, MD  Continuous Glucose Sensor (DEXCOM G7 SENSOR) MISC Change sensors every 10 days Patient taking differently: Inject 1 Device into the skin See admin instructions. Place 1 new sensor into the skin every 10 days 09/07/22   Shamleffer, Ibtehal Jaralla, MD  cyclobenzaprine  (FLEXERIL ) 5 MG tablet Take 1 tablet (5 mg total) by mouth 3 (three) times daily as needed for muscle  spasms. 08/31/23   Ghimire, Donalda HERO, MD  dicyclomine  (BENTYL ) 20 MG tablet Take 20 mg by mouth 2 (two) times daily as needed for spasms.    [provider]  DULoxetine  (CYMBALTA ) 20 MG capsule Take 20 mg by mouth 2 (two) times daily. 07/29/23   [provider]  famotidine  (PEPCID ) 20 MG tablet Take 20 mg by mouth daily before breakfast. 07/29/23   [provider]  insulin  aspart (NOVOLOG ) 100 UNIT/ML injection Inject 5 Units into the skin 3 (three) times daily with meals.    [provider]  lamoTRIgine  (LAMICTAL ) 200 MG tablet Take 1 tablet (200 mg total) by mouth daily. 10/29/23   Regalado, Belkys A, MD  LANTUS  SOLOSTAR 100 UNIT/ML Solostar Pen Inject 10 Units into the skin in the morning.    [provider]  losartan  (COZAAR ) 100 MG tablet Take 150 mg by mouth daily. 03/25/23   [provider]  metoCLOPramide  (REGLAN ) 10 MG tablet Take 1  tablet (10 mg total) by mouth every 8 (eight) hours as needed for nausea. 10/14/23   Danford, Lonni SQUIBB, MD  OLANZapine  zydis (ZYPREXA ) 5 MG disintegrating tablet Take 5 mg by mouth at bedtime. 09/23/23 12/22/23  [provider]  ondansetron  (ZOFRAN -ODT) 4 MG disintegrating tablet Take 1 tablet (4 mg total) by mouth every 8 (eight) hours as needed for nausea or vomiting. Patient not taking: Reported on 11/17/2023 06/23/23   Nicholaus Cassondra DEL, MD  Pancrelipase , Lip-Prot-Amyl, 3000-9500 units CPEP Take 3,000 units of lipase by mouth 3 (three) times daily. 09/23/23 12/22/23  [provider]  sevelamer  carbonate (RENVELA ) 800 MG tablet Take 2,400 mg by mouth 3 (three) times daily with meals.    [provider]  sodium bicarbonate  650 MG tablet Take 1,300 mg by mouth 2 (two) times daily. 07/29/23   [provider]  SUMAtriptan  (IMITREX ) 50 MG tablet Take 50 mg by mouth every 2 (two) hours as needed for migraine or headache. 06/20/22   [provider]    Physical Exam: Vitals:    11/26/23 0040 11/26/23 0045  BP:  (!) 178/95  Pulse:  97  Resp:  20  Temp:  98.2 F (36.8 C)  TempSrc:  Oral  SpO2:  100%  Weight: 70.8 kg   Height: 5' 3 (1.6 m)     Constitutional: NAD, calm, comfortable Vitals:   11/26/23 0040 11/26/23 0045  BP:  (!) 178/95  Pulse:  97  Resp:  20  Temp:  98.2 F (36.8 C)  TempSrc:  Oral  SpO2:  100%  Weight: 70.8 kg   Height: 5' 3 (1.6 m)    Eyes: pupils equal  lids and conjunctivae normal ENMT: Mucous membranes are moist. Posterior pharynx clear of any exudate or lesions.Normal dentition.  Neck: normal, supple, no masses, no thyromegaly Respiratory: clear to auscultation bilaterally, no wheezing, no crackles. Normal respiratory effort. No accessory muscle use.  Cardiovascular: Regular rate and rhythm, +murmurs / rubs / gallops. +extremity edema. 2+ pedal pulses.   Abdomen: + tenderness, distended, no masses palpated. Bowel sounds positive.  Musculoskeletal: no clubbing / cyanosis. No joint deformity upper and lower extremities. Good ROM, no contractures. Normal muscle tone.  Skin: no rashes, lesions, ulcers. No induration, ulcerated boil on left labia. Neurologic: CN 2-12 grossly intact. Sensation intact, Strength 5/5 in all 4.  Psychiatric: Normal judgment and insight. Alert and oriented x 3. Normal mood.    Labs on Admission: I have personally reviewed following labs and imaging studies  CBC: Recent Labs  Lab 11/26/23 0045 11/26/23 0214  WBC 10.1  --   NEUTROABS 6.9  --   HGB 9.4* 11.2*  HCT 32.9* 33.0*  MCV 96.8  --   PLT 265  --    Basic Metabolic Panel: Recent Labs  Lab 11/26/23 0045 11/26/23 0214  NA 122* 124*  K 3.9 3.9  CL 86* 90*  CO2 18*  --   GLUCOSE 1,132* >700*  BUN 46* 41*  CREATININE 8.31* 7.90*  CALCIUM  7.5*  --    GFR: Estimated Creatinine Clearance: 9.7 mL/min (A) (by C-G formula based on SCr of 7.9 mg/dL (H)). Liver Function Tests: Recent Labs  Lab 11/26/23 0045  AST 59*  ALT 47*   ALKPHOS 882*  BILITOT 2.0*  PROT 5.5*  ALBUMIN  2.6*   Recent Labs  Lab 11/26/23 0045  LIPASE 30   No results for input(s): AMMONIA in the last 168 hours. Coagulation Profile: No results for input(s): INR, PROTIME  in the last 168 hours. Cardiac Enzymes: No results for input(s): CKTOTAL, CKMB, CKMBINDEX, TROPONINI in the last 168 hours. BNP (last 3 results) No results for input(s): PROBNP in the last 8760 hours. HbA1C: No results for input(s): HGBA1C in the last 72 hours. CBG: Recent Labs  Lab 11/26/23 0104  GLUCAP >600*   Lipid Profile: No results for input(s): CHOL, HDL, LDLCALC, TRIG, CHOLHDL, LDLDIRECT in the last 72 hours. Thyroid  Function Tests: No results for input(s): TSH, T4TOTAL, FREET4, T3FREE, THYROIDAB in the last 72 hours. Anemia Panel: No results for input(s): VITAMINB12, FOLATE, FERRITIN, TIBC, IRON , RETICCTPCT in the last 72 hours. Urine analysis:    Component Value Date/Time   COLORURINE YELLOW 10/25/2023 2300   APPEARANCEUR CLOUDY (A) 10/25/2023 2300   APPEARANCEUR Hazy 11/10/2013 2043   LABSPEC 1.016 10/25/2023 2300   LABSPEC 1.031 11/10/2013 2043   PHURINE 6.0 10/25/2023 2300   GLUCOSEU >=500 (A) 10/25/2023 2300   GLUCOSEU >=500 11/10/2013 2043   HGBUR SMALL (A) 10/25/2023 2300   BILIRUBINUR NEGATIVE 10/25/2023 2300   BILIRUBINUR negative 06/04/2018 1035   BILIRUBINUR Negative 11/24/2015 1443   BILIRUBINUR Negative 11/10/2013 2043   KETONESUR 5 (A) 10/25/2023 2300   PROTEINUR >=300 (A) 10/25/2023 2300   UROBILINOGEN 0.2 09/14/2019 1732   NITRITE NEGATIVE 10/25/2023 2300   LEUKOCYTESUR NEGATIVE 10/25/2023 2300   LEUKOCYTESUR 1+ 11/10/2013 2043    Radiological Exams on Admission: DG Chest Portable 1 View Result Date: 11/26/2023 CLINICAL DATA:  Shortness of breath EXAM: PORTABLE CHEST 1 VIEW COMPARISON:  11/17/2023 FINDINGS: Cardiomegaly. No confluent opacities, effusions or edema. No acute  bony abnormality. IMPRESSION: Cardiomegaly.  No active disease. Electronically Signed   By: Franky Crease M.D.   On: 11/26/2023 01:07    EKG: Independently reviewed.  Assessment/Plan Type II DM with DKA -dka due to noncompliance with medication  -place on insulin  drip per  dka protocol  - bmp q4h  -repeat abg now  -ivfs bolus due to renal failure and hx of missed HD x 2 sessions -strict I/o   Acute CHFref exacerbation -fluid status managed with HD   Labial ulcerated area  -presumed boil cannot r/o HSV - hsv swab  - bactrim  / supportive care with warm compresses  Hypertension -uncontrolled - resume coreg  - prn hydral  Bipolar D/o -continue on zyprexa   and cymbalta  and lamictal    Seizure d/o  -resume lamictal       DVT prophylaxis: heparin  Code Status: full/ as discussed per patient wishes in event of cardiac arrest  Family Communication:  none at bedside Disposition Plan: patient  expected to be admitted greater than 2 midnights  Consults called:  Nephrology  Admission status: progressive care   Ashley DELENA Ned MD [facility]   If 7PM-7AM, please contact night-coverage [email or web address] Password [email or web address]  11/26/2023, 3:11 AM

## 2023-11-26 NOTE — ED Notes (Signed)
 Consulted lab to obtain blood samples needed.

## 2023-11-26 NOTE — Consult Note (Signed)
 ESRD Consult Note  Requesting provider: Reyes Gaw Service requesting consult: Hospitalist Reason for consult: ESRD, provision of dialysis Indication for acute dialysis?: End Stage Renal Disease  Outpatient dialysis unit: [facility] Outpatient dialysis prescription:  3h  60kg   B350  AVG   Heparin  1600 + 600u/hr  Assessment/Recommendations: Ashley Freeman is a/an 33 y.o. female with a past medical history notable for ESRD on HD admitted with DKA and volume overload.   # ESRD: poor compliance.  TTS schedule.  Plan for dialysis today versus tomorrow pending nursing availability.  # Volume/ hypertension: Volume overloaded on exam.  Paracentesis as below.  Ultrafiltration with dialysis as tolerated.  Continue home blood pressure medications  # Anemia of Chronic Kidney Disease: Hemoglobin 10.9.  No ESA needed.  Consider iron  studies.   # Secondary Hyperparathyroidism/Hyperphosphatemia: Calcium  corrects near normal when accounting for albumin .  Continue home phosphorus binders.  # Vascular access: AVG with no issues  # Diabetic ketoacidosis: Insulin  drip per primary  # Abdominal distention: Likely some ascites from volume overload and poor compliance with dialysis.  Consider IR for LVP    # Additional recommendations: - Dose all meds for creatinine clearance < 10 ml/min  - Unless absolutely necessary, no MRIs with gadolinium.  - Implement save arm precautions.  Prefer needle sticks in the dorsum of the hands or wrists.  No blood pressure measurements in arm. - If blood transfusion is requested during hemodialysis sessions, please alert us  prior to the session.  - Use synthetic opioids (Fentanyl /Dilaudid ) if needed  Recommendations were discussed with the primary team.   History of Present Illness: Ashley Freeman is a/an 33 y.o. female with a past medical history of ESRD who presents with shortness of breath and abdominal  distention  Patient presented to the hospital yesterday for worsening abdominal distention, high blood sugars, pain all over.  She also feels that her abdomen has been getting more distended.  She required paracentesis about 2 months ago from what she can remember.  She also feels like she is volume overloaded in general.  She missed dialysis on Saturday.  She said she did not have a ride.  She has had some nausea but no vomiting.  Denies fevers or chills.  He has short of breath particularly with activity.  Very high blood sugars at home.  In the department she was noted to have significant hyperglycemia.  Signs of volume overload.  Plan was for admission.   Medications:  Current Facility-Administered Medications  Medication Dose Route Frequency Provider Last Rate Last Admin   albuterol  (PROVENTIL ) (2.5 MG/3ML) 0.083% nebulizer solution 2.5 mg  2.5 mg Inhalation Q4H PRN Debby Camila LABOR, MD       atorvastatin  (LIPITOR ) tablet 80 mg  80 mg Oral Daily Thomas, Sara-Maiz A, MD   80 mg at 11/26/23 0945   bacitracin  ointment   Topical BID Debby Camila LABOR, MD   1 Application at 11/26/23 9052   calcitRIOL  (ROCALTROL ) capsule 1.25 mcg  1.25 mcg Oral Q T,Th,Sat-1800 Debby Camila A, MD       carvedilol  (COREG ) tablet 12.5 mg  12.5 mg Oral BID WC Debby Camila A, MD   12.5 mg at 11/26/23 9261   dextrose  50 % solution 0-50 mL  0-50 mL Intravenous PRN Rancour, Garnette, MD       dicyclomine  (BENTYL ) tablet 20 mg  20 mg Oral BID PRN Debby Camila LABOR, MD       DULoxetine  (CYMBALTA ) DR capsule  20 mg  20 mg Oral BID Debby Hitch A, MD   20 mg at 11/26/23 9053   famotidine  (PEPCID ) tablet 20 mg  20 mg Oral QAC breakfast Thomas, Sara-Maiz A, MD   20 mg at 11/26/23 0739   heparin  injection 5,000 Units  5,000 Units Subcutaneous Q8H Debby Hitch LABOR, MD       insulin  regular, human (MYXREDLIN ) 100 units/ 100 mL infusion   Intravenous Continuous Rancour, Stephen, MD 6.5 mL/hr at 11/26/23 0646  6.5 Units/hr at 11/26/23 0646   lamoTRIgine  (LAMICTAL ) tablet 200 mg  200 mg Oral Daily Thomas, Sara-Maiz A, MD   200 mg at 11/26/23 0946   lipase/protease/amylase (CREON ) capsule 12,000 Units  12,000 Units Oral TID WC Debby Hitch LABOR, MD   12,000 Units at 11/26/23 0755   losartan  (COZAAR ) tablet 150 mg  150 mg Oral Daily Thomas, Sara-Maiz A, MD   150 mg at 11/26/23 9052   metoCLOPramide  (REGLAN ) tablet 10 mg  10 mg Oral Q8H PRN Debby Hitch LABOR, MD       OLANZapine  zydis (ZYPREXA ) disintegrating tablet 5 mg  5 mg Oral QHS Thomas, Sara-Maiz A, MD       sevelamer  carbonate (RENVELA ) tablet 2,400 mg  2,400 mg Oral TID WC Debby Hitch A, MD   2,400 mg at 11/26/23 9260   sodium bicarbonate  tablet 1,300 mg  1,300 mg Oral BID Thomas, Sara-Maiz A, MD   1,300 mg at 11/26/23 9052   sodium chloride  0.9 % bolus 1,000 mL  1,000 mL Intravenous Once Rancour, Stephen, MD       sulfamethoxazole -trimethoprim  (BACTRIM ) 400-80 MG per tablet 1 tablet  1 tablet Oral Q12H Debby Hitch LABOR, MD   1 tablet at 11/26/23 0945   Current Outpatient Medications  Medication Sig Dispense Refill   albuterol  (VENTOLIN  HFA) 108 (90 Base) MCG/ACT inhaler Inhale 2 puffs into the lungs every 4 (four) hours as needed for wheezing or shortness of breath.     atorvastatin  (LIPITOR ) 80 MG tablet Take 1 tablet (80 mg total) by mouth daily. 90 tablet 3   bumetanide  (BUMEX ) 2 MG tablet Take 2 mg by mouth daily.     calcitRIOL  (ROCALTROL ) 0.25 MCG capsule Take 5 capsules (1.25 mcg total) by mouth every Tuesday, Thursday, and Saturday at 6 PM. 30 capsule 0   carvedilol  (COREG ) 12.5 MG tablet Take 1 tablet (12.5 mg total) by mouth 2 (two) times daily with a meal. 30 tablet 0   Continuous Glucose Sensor (DEXCOM G7 SENSOR) MISC Change sensors every 10 days (Patient taking differently: Inject 1 Device into the skin See admin instructions. Place 1 new sensor into the skin every 10 days) 3 each 3   cyclobenzaprine  (FLEXERIL ) 5 MG  tablet Take 1 tablet (5 mg total) by mouth 3 (three) times daily as needed for muscle spasms. 30 tablet 0   dicyclomine  (BENTYL ) 20 MG tablet Take 20 mg by mouth 2 (two) times daily as needed for spasms.     DULoxetine  (CYMBALTA ) 20 MG capsule Take 20 mg by mouth 2 (two) times daily.     famotidine  (PEPCID ) 20 MG tablet Take 20 mg by mouth daily before breakfast.     HYDROmorphone  (DILAUDID ) 2 MG tablet Take 2 mg by mouth every 4 (four) hours as needed for severe pain (pain score 7-10).     hydrOXYzine  (ATARAX ) 25 MG tablet Take 25 mg by mouth 3 (three) times daily as needed for anxiety, itching, nausea or vomiting.  insulin  aspart (NOVOLOG ) 100 UNIT/ML injection Inject 5 Units into the skin 3 (three) times daily with meals.     lamoTRIgine  (LAMICTAL ) 200 MG tablet Take 1 tablet (200 mg total) by mouth daily. 30 tablet 0   LANTUS  SOLOSTAR 100 UNIT/ML Solostar Pen Inject 10 Units into the skin in the morning.     losartan  (COZAAR ) 100 MG tablet Take 150 mg by mouth daily.     metoCLOPramide  (REGLAN ) 10 MG tablet Take 1 tablet (10 mg total) by mouth every 8 (eight) hours as needed for nausea. 12 tablet 0   OLANZapine  zydis (ZYPREXA ) 5 MG disintegrating tablet Take 5 mg by mouth at bedtime.     ondansetron  (ZOFRAN -ODT) 4 MG disintegrating tablet Take 1 tablet (4 mg total) by mouth every 8 (eight) hours as needed for nausea or vomiting. (Patient not taking: Reported on 11/17/2023) 10 tablet 0   Pancrelipase , Lip-Prot-Amyl, 3000-9500 units CPEP Take 3,000 units of lipase by mouth 3 (three) times daily.     sevelamer  carbonate (RENVELA ) 800 MG tablet Take 2,400 mg by mouth 3 (three) times daily with meals.     sodium bicarbonate  650 MG tablet Take 1,300 mg by mouth 2 (two) times daily.     SUMAtriptan  (IMITREX ) 50 MG tablet Take 50 mg by mouth every 2 (two) hours as needed for migraine or headache.       ALLERGIES Cephalexin , Penicillins, Fish allergy, Benadryl  [diphenhydramine ], Dilaudid   [hydromorphone ], Doxycycline , Methotrexate derivatives, and Oxycodone   MEDICAL HISTORY Past Medical History:  Diagnosis Date   Abscess, gluteal, right 08/24/2013   Anemia 02/19/2012   Bartholin's gland abscess 09/19/2013   Bipolar disorder (HCC)    BV (bacterial vaginosis) 11/24/2015   Depression    Diabetes mellitus type I (HCC) 2001   Diagnosed at age 63 ; Type I   Diarrhea 05/30/2016   DKA (diabetic ketoacidoses) 08/19/2013   Also in 2018   ESRD (end stage renal disease) (HCC)    Gonorrhea 08/2011   Treated in 09/2011   HFrEF (heart failure with reduced ejection fraction) (HCC)    a. 2022 Echo: EF 40%; b. 10/2021 Echo: EF 55%; b. 07/2022 MV: No ischemia. EF 31%; c. 08/2022 Echo: EF 35%, mildly dil RV, sev TR.   History of trichomoniasis 05/31/2016   Hyperlipidemia 03/28/2016   Hypertension    NICM (nonischemic cardiomyopathy) (HCC)    Sepsis (HCC) 09/19/2013     SOCIAL HISTORY Social History   Socioeconomic History   Marital status: Single    Spouse name: Not on file   Number of children: 0   Years of education: 11th grade   Highest education level: Not on file  Occupational History   Occupation: unemployed    Comment: has never worked  Tobacco Use   Smoking status: Never    Passive exposure: Never   Smokeless tobacco: Never  Vaping Use   Vaping status: Never Used  Substance and Sexual Activity   Alcohol use: Not Currently   Drug use: No   Sexual activity: Yes    Birth control/protection: None  Other Topics Concern   Not on file  Social History Narrative   Patient lives in [place] mother lives in [place].  Unemployed.  Previously worked for a Customer service manager.  Completed 11 grade working on [employer]. Patient 3 brothers    Social Drivers of Corporate investment banker Strain: Medium Risk (11/15/2023)   Received from [facility]   Overall Financial Resource Strain (CARDIA)  How hard is it for you to pay for the very basics like food, housing, medical  care, and heating?: Somewhat hard  Food Insecurity: No Food Insecurity (11/03/2023)   Hunger Vital Sign    Worried About Running Out of Food in the Last Year: Never true    Ran Out of Food in the Last Year: Never true  Transportation Needs: Unmet Transportation Needs (11/03/2023)   PRAPARE - Administrator, Civil Service (Medical): Yes    Lack of Transportation (Non-Medical): Yes  Physical Activity: Insufficiently Active (03/02/2022)   [facility]   Exercise Vital Sign    On average, how many days per week do you engage in moderate to strenuous exercise (like a brisk walk)?: 2 days    On average, how many minutes do you engage in exercise at this level?: 10 min  Stress: No Stress Concern Present (03/08/2023)   Received from [facility] of Occupational [facility] - Occupational Stress Questionnaire    Feeling of Stress : Not at all  Social Connections: Moderately Integrated (08/28/2023)   Social Connection and Isolation Panel    Frequency of Communication with Friends and Family: More than three times a week    Frequency of Social Gatherings with Friends and Family: Once a week    Attends Religious Services: More than 4 times per year    Active Member of [employer] or Organizations: Yes    Attends Banker Meetings: Never    Marital Status: Never married  Intimate Partner Violence: Not At Risk (11/03/2023)   Humiliation, Afraid, Rape, and Kick questionnaire    Fear of Current or Ex-Partner: No    Emotionally Abused: No    Physically Abused: No    Sexually Abused: No     FAMILY HISTORY Family History  Problem Relation Age of Onset   Asthma Mother    Carpal tunnel syndrome Mother    Gout Father    Diabetes Paternal Grandmother    Anesthesia problems Neg Hx      Review of Systems: 12 systems were reviewed and negative except per HPI  Physical Exam: Vitals:   11/26/23 0845 11/26/23 0943  BP: (!) 160/25   Pulse: 93   Resp: 20    Temp:  98.6 F (37 C)  SpO2: 99%    No intake/output data recorded.  Intake/Output Summary (Last 24 hours) at 11/26/2023 1050 Last data filed at 11/26/2023 0526 Gross per 24 hour  Intake 1000 ml  Output --  Net 1000 ml   General: Chronically ill-appearing, no acute distress HEENT: anicteric sclera, MMM CV: normal rate, no murmurs, trace lower extremity edema Lungs: bilateral chest rise, normal wob Abd: soft, non-tender, non-distended Skin: no visible lesions or rashes Psych: Tired but alert, engaged, appropriate mood and affect Neuro: normal speech, no gross focal deficits   Test Results Reviewed Lab Results  Component Value Date   NA 127 (L) 11/26/2023   K 3.4 (L) 11/26/2023   CL 94 (L) 11/26/2023   CO2 15 (L) 11/26/2023   BUN 46 (H) 11/26/2023   CREATININE 8.47 (H) 11/26/2023   GFR 27.98 (L) 05/24/2021   CALCIUM  7.8 (L) 11/26/2023   ALBUMIN  2.6 (L) 11/26/2023   PHOS 5.6 (H) 11/18/2023    I have reviewed relevant outside healthcare records

## 2023-11-26 NOTE — ED Triage Notes (Signed)
 BIBA d/t high blood sugar. Per EMS bg readig read HI' pt has dialysis Tuesday, Thursday and Saturday. Pt missed Saturday. Hx of CHF.

## 2023-11-26 NOTE — ED Notes (Signed)
 Nt called ccmd@7 :26am

## 2023-11-26 NOTE — ED Notes (Addendum)
 This RN was informed by patient that she would like to leave at this time. This RN educated patient on importance of staying for full length of treatment in hospital. Pt states she would like to leave because she misses her baby and would like to speak to a doctor. Elpidio, MD notified. Pt agreeable to stay for MD to speak with her.

## 2023-11-26 NOTE — Progress Notes (Addendum)
 Progress Note   Patient: Ashley Freeman FMW:[id number] DOB: [birth year] DOA: 11/26/2023     0 DOS: the patient was seen and examined on 11/26/2023   Brief hospital course: TAYLEE GUNNELLS is a 33 y.o. female with medical history significant of ESRD on HD TTS, seizure d/o, bipolar 1 disorder, DM1, chronic CHF with reduced EF 35%, frequent rehospitalization, hypoglycemia who has interim history of admission 7/20-7/21 at which time she was diagnosed with acute on chronic CHF , hyperkalemia who signed out against medical advice( as she had no childcare). Patient now returns BIB EMS due to elevated sugars at home as well as increasing abdominal distention. Patient also noted painful boil on labia.   Assessment and Plan: Type II DM with DKA -dka due to noncompliance with medication  -place on insulin  drip per  dka protocol  - CBGs remain elevatred most of the day.  - Her 11:30 AM CBG finally came down to 297.  - Limit IVF due to her ESRD status and fact she missed 2 HD sessions and remains volume overloaded.  Usually we use ongoing fluid boluses to treat DKA, but not in this case.   - bmp q4h  -repeat abg-->pH good.  Bicarb 16.9.   - also need to be careful with her insulin  in light of ESRD/HD as she cannot urinate and it will be easy to over-correct  - As she is Type 1 diabetic she is insulin -dependent and so therefore starting her on 5 units Lantus , she tells me she takes 10 at home, plus sliding scale with a very sensitive correction due to her ESRD. -CBGs have been in the 140-160 range this afternoon.  Awaiting BMP to ensure gap is closed. -strict I/o    Acute CHFref exacerbation -fluid status managed with HD  - unclear if actual CHF exacerbation vs volume overload from missing HD sessions - can repeat Echo to ensure   Abdominal pain: - denies any constipation/diarrhea.   - Reports similar tightness to when she has missed dialysis in past, pain relieves after  dialysis.   - No guarding/rebound/concerns for acute abdomen currently.    Labial ulcerated area  -presumed boil cannot r/o HSV - hsv swab  - bactrim  / supportive care with warm compresses - can also start valtrex for pain relief.     Hypertension -uncontrolled outpt.  Better here since restarting coreg /PRN hydral - resume coreg  - prn hydral   Bipolar D/o -continue on zyprexa   and cymbalta  and lamictal     Seizure d/o  -resume lamictal         Subjective: Pt lying in bed.  No complaints initially, but does reoprt some abd tenderness when asked.    Physical Exam: Vitals:   11/26/23 0815 11/26/23 0830 11/26/23 0845 11/26/23 0943  BP: (!) 123/93 (!) 157/129 (!) 160/25   Pulse: 88 87 93   Resp: (!) 21 (!) 23 20   Temp:    98.6 F (37 C)  TempSrc:      SpO2: 97% 100% 99%   Weight:      Height:       Gen:  chronically ill-appearing, no distress.  Face appears swollen HEENT:  anicteric, PERRL CV:  RRR with Grade III murmur Lungs:  Clear Abd:  Soft/distended.  No guarding or rebound.  Good BS MSK:  no conractures, good tone Neuro:  Alert and orientedx 4, moving all limbs well and symmetrically  Psych:  normal mood   Data Reviewed:   Disposition: Status is:  Inpatient   Planned Discharge Destination: Home    Time spent: 45 minutes  Author: Reyes VEAR Gaw, MD 11/26/2023 1:09 PM  For on call review [email or web address].

## 2023-11-26 NOTE — ED Provider Notes (Signed)
 [facility] AT [facility] Provider Note   CSN: [id number] Arrival date & time: 11/26/23  0032     Patient presents with: Hyperglycemia   Ashley Freeman is a 33 y.o. female.   Patient with a history of type 1 diabetes, ESRD on dialysis, hypertension, heart failure with reduced ejection fraction of 35% presenting with elevated blood sugar and shortness of breath and weight gain.  She missed dialysis 2 days ago and is due for dialysis in the morning.  Also reports volume overloaded, feeling full and elevated blood sugar despite taking her insulin  at home.  Blood sugar monitor reading high at home.  Feels short of breath.  Feels fluid in her lungs and chest.  Feels fluid in her legs.  Feels distended her abdomen.  Nausea but no vomiting.  No fever.  Does make some urine.  States she sleeps on 10 pillows at baseline.  Called EMS tonight because she felt like her blood sugar was high and she was still full of fluid that she needed to have dialysis more urgently.  EMS reports no hypoxia or increased work of breathing.  The history is provided by the patient and the EMS personnel.  Hyperglycemia Associated symptoms: abdominal pain, nausea and shortness of breath   Associated symptoms: no dizziness, no dysuria, no fever, no vomiting and no weakness        Prior to Admission medications   Medication Sig Start Date End Date Taking? Authorizing Provider  albuterol  (VENTOLIN  HFA) 108 (90 Base) MCG/ACT inhaler Inhale 2 puffs into the lungs every 4 (four) hours as needed for wheezing or shortness of breath. 11/24/22   [provider]  atorvastatin  (LIPITOR ) 80 MG tablet Take 1 tablet (80 mg total) by mouth daily. 10/15/23   Danford, Lonni SQUIBB, MD  bumetanide  (BUMEX ) 2 MG tablet Take 2 mg by mouth daily.    [provider]  calcitRIOL  (ROCALTROL ) 0.25 MCG capsule Take 5 capsules (1.25 mcg total) by mouth every Tuesday, Thursday, and  Saturday at 6 PM. 07/09/23   Cindy Garnette POUR, MD  carvedilol  (COREG ) 12.5 MG tablet Take 1 tablet (12.5 mg total) by mouth 2 (two) times daily with a meal. 10/29/23   Regalado, Belkys A, MD  Continuous Glucose Sensor (DEXCOM G7 SENSOR) MISC Change sensors every 10 days Patient taking differently: Inject 1 Device into the skin See admin instructions. Place 1 new sensor into the skin every 10 days 09/07/22   Shamleffer, Ibtehal Jaralla, MD  cyclobenzaprine  (FLEXERIL ) 5 MG tablet Take 1 tablet (5 mg total) by mouth 3 (three) times daily as needed for muscle spasms. 08/31/23   Ghimire, Donalda HERO, MD  dicyclomine  (BENTYL ) 20 MG tablet Take 20 mg by mouth 2 (two) times daily as needed for spasms.    [provider]  DULoxetine  (CYMBALTA ) 20 MG capsule Take 20 mg by mouth 2 (two) times daily. 07/29/23   [provider]  famotidine  (PEPCID ) 20 MG tablet Take 20 mg by mouth daily before breakfast. 07/29/23   [provider]  insulin  aspart (NOVOLOG ) 100 UNIT/ML injection Inject 5 Units into the skin 3 (three) times daily with meals.    [provider]  lamoTRIgine  (LAMICTAL ) 200 MG tablet Take 1 tablet (200 mg total) by mouth daily. 10/29/23   Regalado, Belkys A, MD  LANTUS  SOLOSTAR 100 UNIT/ML Solostar Pen Inject 10 Units into the skin in the morning.    [provider]  losartan  (COZAAR ) 100 MG  tablet Take 150 mg by mouth daily. 03/25/23   [provider]  metoCLOPramide  (REGLAN ) 10 MG tablet Take 1 tablet (10 mg total) by mouth every 8 (eight) hours as needed for nausea. 10/14/23   Danford, Lonni SQUIBB, MD  OLANZapine  zydis (ZYPREXA ) 5 MG disintegrating tablet Take 5 mg by mouth at bedtime. 09/23/23 12/22/23  [provider]  ondansetron  (ZOFRAN -ODT) 4 MG disintegrating tablet Take 1 tablet (4 mg total) by mouth every 8 (eight) hours as needed for nausea or vomiting. Patient not taking: Reported on 11/17/2023 06/23/23   Nicholaus Cassondra DEL, MD  Pancrelipase ,  Lip-Prot-Amyl, 3000-9500 units CPEP Take 3,000 units of lipase by mouth 3 (three) times daily. 09/23/23 12/22/23  [provider]  sevelamer  carbonate (RENVELA ) 800 MG tablet Take 2,400 mg by mouth 3 (three) times daily with meals.    [provider]  sodium bicarbonate  650 MG tablet Take 1,300 mg by mouth 2 (two) times daily. 07/29/23   [provider]  SUMAtriptan  (IMITREX ) 50 MG tablet Take 50 mg by mouth every 2 (two) hours as needed for migraine or headache. 06/20/22   [provider]    Allergies: Cephalexin , Penicillins, Fish allergy, Benadryl  [diphenhydramine ], Dilaudid  [hydromorphone ], Doxycycline , Methotrexate derivatives, and Oxycodone     Review of Systems  Constitutional:  Negative for activity change, appetite change and fever.  HENT:  Negative for congestion and rhinorrhea.   Respiratory:  Positive for shortness of breath. Negative for cough and chest tightness.   Cardiovascular:  Positive for leg swelling.  Gastrointestinal:  Positive for abdominal pain and nausea. Negative for vomiting.  Genitourinary:  Negative for dysuria and hematuria.  Musculoskeletal:  Negative for arthralgias and myalgias.  Skin:  Negative for rash.  Neurological:  Negative for dizziness, weakness and headaches.   all other systems are negative except as noted in the HPI and PMH.    Updated Vital Signs BP (!) 145/82   Pulse 99   Temp 98.1 F (36.7 C) (Oral)   Resp 20   Ht 5' 3 (1.6 m)   Wt 70.8 kg   LMP 01/29/2020 (Approximate)   SpO2 100%   BMI 27.63 kg/m   Physical Exam Vitals and nursing note reviewed.  Constitutional:      General: She is not in acute distress.    Appearance: She is well-developed. She is ill-appearing.     Comments: Chronically ill-appearing No hypoxia or increased work of breathing  HENT:     Head: Normocephalic and atraumatic.     Mouth/Throat:     Pharynx: No oropharyngeal exudate.  Eyes:     Conjunctiva/sclera: Conjunctivae  normal.     Pupils: Pupils are equal, round, and reactive to light.  Neck:     Comments: No meningismus. Cardiovascular:     Rate and Rhythm: Normal rate and regular rhythm.     Heart sounds: Normal heart sounds. No murmur heard. Pulmonary:     Effort: Pulmonary effort is normal. No respiratory distress.     Breath sounds: Rales present.  Abdominal:     General: There is distension.     Palpations: Abdomen is soft.     Tenderness: There is abdominal tenderness. There is no guarding or rebound.     Comments: Diffuse tenderness, no guarding or rebound  Musculoskeletal:        General: No tenderness. Normal range of motion.     Cervical back: Normal range of motion and neck supple.     Right lower leg: Edema  present.     Left lower leg: Edema present.  Skin:    General: Skin is warm.  Neurological:     Mental Status: She is alert and oriented to person, place, and time.     Cranial Nerves: No cranial nerve deficit.     Motor: No abnormal muscle tone.     Coordination: Coordination normal.     Comments:  5/5 strength throughout. CN 2-12 intact.Equal grip strength.   Psychiatric:        Behavior: Behavior normal.     (all labs ordered are listed, but only abnormal results are displayed) Labs Reviewed  CBC WITH DIFFERENTIAL/PLATELET - Abnormal; Notable for the following components:      Result Value   RBC 3.40 (*)    Hemoglobin 9.4 (*)    HCT 32.9 (*)    MCHC 28.6 (*)    RDW 17.3 (*)    All other components within normal limits  COMPREHENSIVE METABOLIC PANEL WITH GFR - Abnormal; Notable for the following components:   Sodium 122 (*)    Chloride 86 (*)    CO2 18 (*)    Glucose, Bld 1,132 (*)    BUN 46 (*)    Creatinine, Ser 8.31 (*)    Calcium  7.5 (*)    Total Protein 5.5 (*)    Albumin  2.6 (*)    AST 59 (*)    ALT 47 (*)    Alkaline Phosphatase 882 (*)    Total Bilirubin 2.0 (*)    GFR, Estimated 6 (*)    Anion gap 18 (*)    All other components within normal  limits  BRAIN NATRIURETIC PEPTIDE - Abnormal; Notable for the following components:   B Natriuretic Peptide >4,500.0 (*)    All other components within normal limits  CBG MONITORING, ED - Abnormal; Notable for the following components:   Glucose-Capillary >600 (*)    All other components within normal limits  I-STAT CHEM 8, ED - Abnormal; Notable for the following components:   Sodium 124 (*)    Chloride 90 (*)    BUN 41 (*)    Creatinine, Ser 7.90 (*)    Glucose, Bld >700 (*)    Calcium , Ion 0.99 (*)    TCO2 21 (*)    Hemoglobin 11.2 (*)    HCT 33.0 (*)    All other components within normal limits  I-STAT CG4 LACTIC ACID, ED - Abnormal; Notable for the following components:   Lactic Acid, Venous 2.0 (*)    All other components within normal limits  TROPONIN I (HIGH SENSITIVITY) - Abnormal; Notable for the following components:   Troponin I (High Sensitivity) 18 (*)    All other components within normal limits  LIPASE, BLOOD  HCG, SERUM, QUALITATIVE  URINALYSIS, ROUTINE W REFLEX MICROSCOPIC  BETA-HYDROXYBUTYRIC ACID  I-STAT CG4 LACTIC ACID, ED  TROPONIN I (HIGH SENSITIVITY)    EKG: EKG Interpretation Date/Time:  Tuesday November 26 2023 03:11:29 EDT Ventricular Rate:  93 PR Interval:  139 QRS Duration:  124 QT Interval:  398 QTC Calculation: 496 R Axis:   34  Text Interpretation: Sinus rhythm Nonspecific intraventricular conduction delay rate fasrte Confirmed by Carita Senior [phone]) on 11/26/2023 3:20:40 AM  Radiology: DG Chest Portable 1 View Result Date: 11/26/2023 CLINICAL DATA:  Shortness of breath EXAM: PORTABLE CHEST 1 VIEW COMPARISON:  11/17/2023 FINDINGS: Cardiomegaly. No confluent opacities, effusions or edema. No acute bony abnormality. IMPRESSION: Cardiomegaly.  No active disease. Electronically Signed  By: Franky Crease M.D.   On: 11/26/2023 01:07     .Critical Care  Performed by: Carita Senior, MD Authorized by: Carita Senior, MD   Critical care  provider statement:    Critical care time (minutes):  60   Critical care time was exclusive of:  Separately billable procedures and treating other patients   Critical care was necessary to treat or prevent imminent or life-threatening deterioration of the following conditions:  Dehydration and endocrine crisis   Critical care was time spent personally by me on the following activities:  Development of treatment plan with patient or surrogate, discussions with consultants, evaluation of patient's response to treatment, examination of patient, ordering and review of laboratory studies, ordering and review of radiographic studies, ordering and performing treatments and interventions, pulse oximetry, re-evaluation of patient's condition, review of old charts, blood draw for specimens and obtaining history from patient or surrogate   I assumed direction of critical care for this patient from another provider in my specialty: no      Medications Ordered in the ED - No data to display                                  Medical Decision Making Amount and/or Complexity of Data Reviewed Labs: ordered. Decision-making details documented in ED Course. Radiology: ordered and independent interpretation performed. Decision-making details documented in ED Course. ECG/medicine tests: ordered and independent interpretation performed. Decision-making details documented in ED Course.  Risk Prescription drug management. Decision regarding hospitalization.   ESRD patient with recent missed dialysis sessions, hyperglycemia, presents with volume overload, elevated blood sugar.  No hypoxia or increased work of breathing for EMS but does report distended abdomen and legs with concern for sensation of fullness in her chest.  CBG over 600 on arrival. Sodium 124, potassium 3.9, glucose over 700, creatinine 7.9  Lab glucose is 1132.  Sodium 122 corrects to 139.  Will give IV fluids as well as insulin . Bicarb 18, gap  18. pH 7.39  No hypoxia or increased work of breathing. Due for dialysis later this morning. Will give judicious IV fluids given her DKA.   Continue IV insulin . No indication for emergent dialysis at this time. D/w Dr. Debby who doesn't recommend any additional IVF at this time. She will evaluate for admission.     Final diagnoses:  Diabetic ketoacidosis without coma associated with type 1 diabetes mellitus (HCC)  ESRD (end stage renal disease) (HCC)  Hypervolemia, unspecified hypervolemia type    ED Discharge Orders     None          Tunis Gentle, Senior, MD 11/26/23 [phone]

## 2023-11-26 NOTE — Inpatient Diabetes Management (Signed)
 Inpatient Diabetes Program Recommendations  AACE/ADA: New Consensus Statement on Inpatient Glycemic Control (2015)  Target Ranges:  Prepandial:   less than 140 mg/dL      Peak postprandial:   less than 180 mg/dL (1-2 hours)      Critically ill patients:  140 - 180 mg/dL   Lab Results  Component Value Date   GLUCAP >600 (HH) 11/26/2023   HGBA1C 11.2 (H) 11/18/2023    Review of Glycemic Control  History: Type 1 Diabetes, Recurrent DKA, ESRD   Home DM Meds: Dexcom G7 CGM                             Lantus  10 units daily                             Novolog  5 units TID   Current Orders: IV insulin  gtt/Endotool/DKA      IV insulin  for now  At time of transition consider:  -   Semglee  10 units -   Novolog  0-6 units tid + hs -   additional Novolog  2 units tid meal coverage when eating >50% of meals    Well known to the Inpatient Diabetes Team due to frequent hospitalizations Leaves AMA sometimes Patient has had 7 ED visits and 4 of those admission in the month of July alone, has been seen multiple times by DM coordinator.  Unsure how to help patient with her DM.  Needs close follow-up with PCP and endocrinology.  Thanks,  Clotilda Bull RN, MSN, BC-ADM Inpatient Diabetes Coordinator Team Pager [phone] (8a-5p)

## 2023-11-26 NOTE — ED Notes (Signed)
 Patient notified of bed placement on the floor. Pt agreeable to being transported to floor for care at this time. Pt calm and cooperative.

## 2023-11-26 NOTE — ED Notes (Addendum)
 Per [facility], Insulin  infusion has been paused at this time. Last cbg was 144. No active order for D5/LR, as suggested by [employer]. MD notified. Insulin  infusion to be stopped at this time per Elpidio, MD.

## 2023-11-27 DIAGNOSIS — E8770 Fluid overload, unspecified: Secondary | ICD-10-CM

## 2023-11-27 DIAGNOSIS — E1010 Type 1 diabetes mellitus with ketoacidosis without coma: Secondary | ICD-10-CM

## 2023-11-27 DIAGNOSIS — N186 End stage renal disease: Secondary | ICD-10-CM | POA: Diagnosis not present

## 2023-11-27 LAB — BASIC METABOLIC PANEL WITH GFR
Anion gap: 15 (ref 5–15)
BUN: 49 mg/dL — ABNORMAL HIGH (ref 6–20)
CO2: 20 mmol/L — ABNORMAL LOW (ref 22–32)
Calcium: 8.3 mg/dL — ABNORMAL LOW (ref 8.9–10.3)
Chloride: 95 mmol/L — ABNORMAL LOW (ref 98–111)
Creatinine, Ser: 8.42 mg/dL — ABNORMAL HIGH (ref 0.44–1.00)
GFR, Estimated: 6 mL/min — ABNORMAL LOW (ref >60)
Glucose, Bld: 157 mg/dL — ABNORMAL HIGH (ref 70–99)
Potassium: 4.3 mmol/L (ref 3.5–5.1)
Sodium: 130 mmol/L — ABNORMAL LOW (ref 135–145)

## 2023-11-27 LAB — CBC
HCT: 34.3 % — ABNORMAL LOW (ref 36.0–46.0)
Hemoglobin: 10.6 g/dL — ABNORMAL LOW (ref 12.0–15.0)
MCH: 27.4 pg (ref 26.0–34.0)
MCHC: 30.9 g/dL (ref 30.0–36.0)
MCV: 88.6 fL (ref 80.0–100.0)
Platelets: 324 K/uL (ref 150–400)
RBC: 3.87 MIL/uL (ref 3.87–5.11)
RDW: 17.1 % — ABNORMAL HIGH (ref 11.5–15.5)
WBC: 11.1 K/uL — ABNORMAL HIGH (ref 4.0–10.5)
nRBC: 0 % (ref 0.0–0.2)

## 2023-11-27 LAB — HSV 1 ANTIBODY, IGG: HSV 1 Glycoprotein G Ab, IgG: REACTIVE — AB

## 2023-11-27 LAB — GLUCOSE, CAPILLARY
Glucose-Capillary: 149 mg/dL — ABNORMAL HIGH (ref 70–99)
Glucose-Capillary: 108 mg/dL — ABNORMAL HIGH (ref 70–99)
Glucose-Capillary: 158 mg/dL — ABNORMAL HIGH (ref 70–99)
Glucose-Capillary: 204 mg/dL — ABNORMAL HIGH (ref 70–99)

## 2023-11-27 LAB — HSV 2 ANTIBODY, IGG: HSV 2 Glycoprotein G Ab, IgG: NONREACTIVE

## 2023-11-27 MED ORDER — MENTHOL 3 MG MT LOZG
1.0000 | LOZENGE | OROMUCOSAL | Status: DC | PRN
  Administered 2023-11-27: 3 mg via ORAL
  Filled 2023-11-27: qty 9

## 2023-11-27 NOTE — Plan of Care (Signed)
   Problem: Education: Goal: Ability to describe self-care measures that may prevent or decrease complications (Diabetes Survival Skills Education) will improve Outcome: Progressing   Problem: Coping: Goal: Ability to adjust to condition or change in health will improve Outcome: Progressing   Problem: Fluid Volume: Goal: Ability to maintain a balanced intake and output will improve Outcome: Progressing

## 2023-11-27 NOTE — Progress Notes (Signed)
 Patient off unit to dialysis.

## 2023-11-27 NOTE — Progress Notes (Signed)
 Ashley Freeman  FMW:[id number] DOB: 05-25-1990 DOA: 11/26/2023 PCP: Keven Crumbly Pap, MD    Brief Narrative:  33 year old with a history of ESRD on HD TTS, seizure disorder, bipolar 1 disorder, DM1, systolic CHF with EF 35%, and frequent hospitalizations who presented to the ER via EMS due to severely elevated CBGs at home as well as abdominal distention and painful boil on her labia.  In the ER she was found to be in DKA.  Goals of Care:   Code Status: Full Code   DVT prophylaxis: heparin  injection 5,000 Units Start: 11/26/23 1400   Interim Hx: Afebrile.  Vital signs stable.  CBG currently well-controlled.  Resting comfortably in bed.  Reports that she feels better overall.  Still reports significant edema but feels it is improving rapidly with dialysis.  Assessment & Plan:  DKA and DM1 Due to noncompliance with medical therapy - anion gap closed with CBG now well-controlled - off insulin  drip since 7/29   Acute exacerbation of chronic systolic CHF Due to noncompliance with HD which is her only means of volume control -encouraged patient to fully comply with dialysis  ESRD on HD TTS Long history of poor compliance -ongoing dialysis per nephrology  Anemia of CKD Hemoglobin stable at present  Abdominal distention Likely ascites due to poor compliance with dialysis -monitor with ongoing dialysis -if does not improve will need to consider paracentesis  Ulcerated labial lesion Presumed boil but cannot rule out HSV -continue empiric antibiotic as well as Valtrex  HTN Blood pressure improving with dialysis treatments  Bipolar disorder Continue her usual home medications  Seizure disorder Continue her usual home medications  Family Communication: No family present at time of exam Disposition:   PT Orders:  Follow Up Rec:   Objective: Blood pressure (!) 160/65, pulse 80, temperature 98.4 F (36.9 C), resp. rate 18, height 5' 3 (1.6 m), weight 77.9 kg, last  menstrual period 01/29/2020, SpO2 100%.  Intake/Output Summary (Last 24 hours) at 11/27/2023 1128 Last data filed at 11/27/2023 0500 Gross per 24 hour  Intake 240 ml  Output --  Net 240 ml   Filed Weights   11/27/23 0414 11/27/23 0823 11/27/23 0824  Weight: 75.2 kg 77.9 kg 77.9 kg    Examination: General: No acute respiratory distress Lungs: Clear to auscultation bilaterally without wheezes or crackles Cardiovascular: Regular rate and rhythm without murmur gallop or rub normal S1 and S2 Abdomen: Protuberant, soft, bowel sounds positive, no rebound Extremities: 2+ bilateral lower extremity edema   CBC: Recent Labs  Lab 11/26/23 0045 11/26/23 0214 11/26/23 0611 11/26/23 0621 11/27/23 0240  WBC 10.1  --  10.1  --  11.1*  NEUTROABS 6.9  --   --   --   --   HGB 9.4*   < > 9.0* 10.9* 10.6*  HCT 32.9*   < > 29.5* 32.0* 34.3*  MCV 96.8  --  90.2  --  88.6  PLT 265  --  238  --  324   < > = values in this interval not displayed.   Basic Metabolic Panel: Recent Labs  Lab 11/26/23 0611 11/26/23 0621 11/26/23 2135 11/27/23 0240  NA 128* 127* 129* 130*  K 3.4* 3.4* 4.3 4.3  CL 94*  --  96* 95*  CO2 15*  --  19* 20*  GLUCOSE 841*  --  195* 157*  BUN 46*  --  50* 49*  CREATININE 8.47*  --  8.23* 8.42*  CALCIUM  7.8*  --  8.1* 8.3*   GFR: Estimated Creatinine Clearance: 9.6 mL/min (A) (by C-G formula based on SCr of 8.42 mg/dL (H)).   Scheduled Meds:  atorvastatin   80 mg Oral Daily   bacitracin    Topical BID   calcitRIOL   1.25 mcg Oral Q T,Th,Sat-1800   carvedilol   12.5 mg Oral BID WC   Chlorhexidine  Gluconate Cloth  6 each Topical Q0600   DULoxetine   20 mg Oral BID   famotidine   20 mg Oral QAC breakfast   heparin   5,000 Units Subcutaneous Q8H   insulin  aspart  0-5 Units Subcutaneous QHS   insulin  aspart  0-6 Units Subcutaneous TID WC   insulin  glargine-yfgn  5 Units Subcutaneous Daily   lamoTRIgine   200 mg Oral Daily   lipase/protease/amylase  12,000 Units Oral TID  WC   losartan   150 mg Oral Daily   OLANZapine  zydis  5 mg Oral QHS   sevelamer  carbonate  2,400 mg Oral TID WC   sodium bicarbonate   1,300 mg Oral BID   Continuous Infusions:  insulin  Stopped (11/26/23 1336)   sodium chloride        LOS: 1 day   Reyes IVAR Moores, MD [facility] Office  [phone] Pager - Text Page per Amion  If 7PM-7AM, please contact night-coverage per Amion 11/27/2023, 11:28 AM

## 2023-11-27 NOTE — ED Notes (Signed)
 Floor notified that patient is on the way upstairs.

## 2023-11-27 NOTE — Procedures (Signed)
 I was present at this dialysis session. I have reviewed the session itself and made appropriate changes.   Plan for dialysis again tomorrow per schedule.  Tolerating well today  Filed Weights   11/27/23 0414 11/27/23 0823 11/27/23 0824  Weight: 75.2 kg 77.9 kg 77.9 kg    Recent Labs  Lab 11/27/23 0240  NA 130*  K 4.3  CL 95*  CO2 20*  GLUCOSE 157*  BUN 49*  CREATININE 8.42*  CALCIUM  8.3*    Recent Labs  Lab 11/26/23 0045 11/26/23 0214 11/26/23 0611 11/26/23 0621 11/27/23 0240  WBC 10.1  --  10.1  --  11.1*  NEUTROABS 6.9  --   --   --   --   HGB 9.4*   < > 9.0* 10.9* 10.6*  HCT 32.9*   < > 29.5* 32.0* 34.3*  MCV 96.8  --  90.2  --  88.6  PLT 265  --  238  --  324   < > = values in this interval not displayed.    Scheduled Meds:  atorvastatin   80 mg Oral Daily   bacitracin    Topical BID   calcitRIOL   1.25 mcg Oral Q T,Th,Sat-1800   carvedilol   12.5 mg Oral BID WC   Chlorhexidine  Gluconate Cloth  6 each Topical Q0600   DULoxetine   20 mg Oral BID   famotidine   20 mg Oral QAC breakfast   heparin   5,000 Units Subcutaneous Q8H   insulin  aspart  0-5 Units Subcutaneous QHS   insulin  aspart  0-6 Units Subcutaneous TID WC   insulin  glargine-yfgn  5 Units Subcutaneous Daily   lamoTRIgine   200 mg Oral Daily   lipase/protease/amylase  12,000 Units Oral TID WC   losartan   150 mg Oral Daily   OLANZapine  zydis  5 mg Oral QHS   sevelamer  carbonate  2,400 mg Oral TID WC   sodium bicarbonate   1,300 mg Oral BID   Continuous Infusions:  sodium chloride      PRN Meds:.acetaminophen , dextrose , dicyclomine , HYDROmorphone , menthol -cetylpyridinium, metoCLOPramide    Jayson Player,  MD 11/27/2023, 12:08 PM

## 2023-11-27 NOTE — Progress Notes (Signed)
   11/27/23 1141  Vitals  Temp 98.3 F (36.8 C)  BP 128/72  Pulse Rate 81  Resp 18  Oxygen Therapy  SpO2 98 %  O2 Device Room Air  Patient Activity (if Appropriate) In bed  Pulse Oximetry Type Continuous  Oximetry Probe Site Changed No  Post Treatment  Dialyzer Clearance Lightly streaked  Liters Processed 64.8  Fluid Removed (mL) 3200 mL  Tolerated HD Treatment Yes

## 2023-11-27 NOTE — Plan of Care (Signed)
  Problem: Education: Goal: Ability to describe self-care measures that may prevent or decrease complications (Diabetes Survival Skills Education) will improve Outcome: Progressing   Problem: Safety: Goal: Ability to remain free from injury will improve Outcome: Progressing   Problem: Activity: Goal: Capacity to carry out activities will improve Outcome: Progressing

## 2023-11-27 NOTE — Progress Notes (Signed)
 Pt receives OP HD TTS via [employer]. Will assist as needed.   Ashley Freeman Dialysis Navigator [phone]

## 2023-11-28 DIAGNOSIS — E8770 Fluid overload, unspecified: Secondary | ICD-10-CM | POA: Diagnosis not present

## 2023-11-28 DIAGNOSIS — N186 End stage renal disease: Secondary | ICD-10-CM | POA: Diagnosis not present

## 2023-11-28 DIAGNOSIS — E1010 Type 1 diabetes mellitus with ketoacidosis without coma: Secondary | ICD-10-CM | POA: Diagnosis not present

## 2023-11-28 LAB — CBC
HCT: 30.1 % — ABNORMAL LOW (ref 36.0–46.0)
Hemoglobin: 9.2 g/dL — ABNORMAL LOW (ref 12.0–15.0)
MCH: 27.6 pg (ref 26.0–34.0)
MCHC: 30.6 g/dL (ref 30.0–36.0)
MCV: 90.4 fL (ref 80.0–100.0)
Platelets: 325 K/uL (ref 150–400)
RBC: 3.33 MIL/uL — ABNORMAL LOW (ref 3.87–5.11)
RDW: 17.2 % — ABNORMAL HIGH (ref 11.5–15.5)
WBC: 9.2 K/uL (ref 4.0–10.5)
nRBC: 0 % (ref 0.0–0.2)

## 2023-11-28 LAB — RENAL FUNCTION PANEL
Albumin: 2.3 g/dL — ABNORMAL LOW (ref 3.5–5.0)
Anion gap: 12 (ref 5–15)
BUN: 34 mg/dL — ABNORMAL HIGH (ref 6–20)
CO2: 26 mmol/L (ref 22–32)
Calcium: 8 mg/dL — ABNORMAL LOW (ref 8.9–10.3)
Chloride: 98 mmol/L (ref 98–111)
Creatinine, Ser: 6.84 mg/dL — ABNORMAL HIGH (ref 0.44–1.00)
GFR, Estimated: 8 mL/min — ABNORMAL LOW (ref >60)
Glucose, Bld: 169 mg/dL — ABNORMAL HIGH (ref 70–99)
Phosphorus: 5 mg/dL — ABNORMAL HIGH (ref 2.5–4.6)
Potassium: 4.4 mmol/L (ref 3.5–5.1)
Sodium: 136 mmol/L (ref 135–145)

## 2023-11-28 LAB — GLUCOSE, CAPILLARY
Glucose-Capillary: 160 mg/dL — ABNORMAL HIGH (ref 70–99)
Glucose-Capillary: 104 mg/dL — ABNORMAL HIGH (ref 70–99)

## 2023-11-28 MED ORDER — HYDROMORPHONE HCL 2 MG PO TABS: 2.0000 mg | ORAL_TABLET | Freq: Four times a day (QID) | ORAL | Status: DC | PRN

## 2023-11-28 MED ORDER — HYDROMORPHONE HCL 2 MG PO TABS: 2.0000 mg | ORAL_TABLET | ORAL | Status: DC | PRN

## 2023-11-28 MED ORDER — BUMETANIDE 2 MG PO TABS
2.0000 mg | ORAL_TABLET | Freq: Every day | ORAL | Status: DC
  Administered 2023-11-28: 2 mg via ORAL
  Filled 2023-11-28: qty 1

## 2023-11-28 NOTE — Progress Notes (Signed)
 Nephrology Follow-Up Consult note  Outpatient dialysis unit: [facility] Outpatient dialysis prescription:  3h  60kg   B350  AVG   Heparin  1600 + 600u/hr   Assessment/Recommendations: Ashley Freeman is a/an 33 y.o. female with a past medical history notable for ESRD on HD admitted with DKA and volume overload.    # ESRD: poor compliance.  TTS schedule.  Got dialysis on Wednesday.  Due to high census may be delayed until dialysis tomorrow.  Will see if she can be discharged to receive dialysis today outpatient or tomorrow outpatient.  If she remains inpatient we will do dialysis in the morning or this afternoon if we have availability   # Volume/ hypertension: Volume overloaded on exam.  Ultrafiltration with dialysis.  Continue home blood pressure medications   # Anemia of Chronic Kidney Disease: Hemoglobin slightly below goal today.  Consider ESA if she remains inpatient   # Secondary Hyperparathyroidism/Hyperphosphatemia: Calcium  corrects near normal when accounting for albumin .  Continue home phosphorus binders.   # Vascular access: AVG with no issues   # Diabetic ketoacidosis: Now improved with treatment per primary team   # Abdominal distention: If she remains inpatient we will consider LVP as this fluid is more easily removed via procedure than dialysis       # Additional recommendations: - Dose all meds for creatinine clearance < 10 ml/min  - Unless absolutely necessary, no MRIs with gadolinium.  - Implement save arm precautions.  Prefer needle sticks in the dorsum of the hands or wrists.  No blood pressure measurements in arm. - If blood transfusion is requested during hemodialysis sessions, please alert us  prior to the session.  - Use synthetic opioids (Fentanyl /Dilaudid ) if needed   Recommendations were discussed with the primary team.   Recommendations conveyed to primary service.    Jayson JINNY Player [facility] 11/28/2023 9:58  AM  ___________________________________________________________  CC: Abdominal distention and shortness of breath  Interval History/Subjective: Patient tired but states she feels okay.  No shortness of breath.  Tolerated dialysis yesterday with no issues   Medications:  Current Facility-Administered Medications  Medication Dose Route Frequency Provider Last Rate Last Admin   acetaminophen  (TYLENOL ) tablet 650 mg  650 mg Oral Q6H PRN Elpidio Reyes DEL, MD       atorvastatin  (LIPITOR ) tablet 80 mg  80 mg Oral Daily Thomas, Sara-Maiz A, MD   80 mg at 11/27/23 1301   bacitracin  ointment   Topical BID Debby Camila LABOR, MD   Given at 11/27/23 2146   bumetanide  (BUMEX ) tablet 2 mg  2 mg Oral Daily Danton Reyes DASEN, MD       calcitRIOL  (ROCALTROL ) capsule 1.25 mcg  1.25 mcg Oral Q T,Th,Sat-1800 Debby Camila A, MD   1.25 mcg at 11/26/23 2225   carvedilol  (COREG ) tablet 12.5 mg  12.5 mg Oral BID WC Debby Camila A, MD   12.5 mg at 11/28/23 0813   Chlorhexidine  Gluconate Cloth 2 % PADS 6 each  6 each Topical Q0600 Player Jayson JINNY, MD   6 each at 11/28/23 0548   dextrose  50 % solution 0-50 mL  0-50 mL Intravenous PRN Rancour, Stephen, MD       dicyclomine  (BENTYL ) tablet 20 mg  20 mg Oral BID PRN Thomas, Sara-Maiz A, MD       DULoxetine  (CYMBALTA ) DR capsule 20 mg  20 mg Oral BID Thomas, Sara-Maiz A, MD   20 mg at 11/27/23 2140   famotidine  (PEPCID ) tablet  20 mg  20 mg Oral QAC breakfast Debby Hitch A, MD   20 mg at 11/28/23 0813   heparin  injection 5,000 Units  5,000 Units Subcutaneous Q8H Debby Hitch LABOR, MD   5,000 Units at 11/28/23 9451   HYDROmorphone  (DILAUDID ) tablet 2 mg  2 mg Oral Q6H PRN Danton Reyes DASEN, MD       insulin  aspart (novoLOG ) injection 0-5 Units  0-5 Units Subcutaneous QHS Elpidio Reyes DEL, MD   2 Units at 11/27/23 2140   insulin  aspart (novoLOG ) injection 0-6 Units  0-6 Units Subcutaneous TID WC Elpidio Reyes DEL, MD   1 Units at 11/28/23 9371    insulin  glargine-yfgn (SEMGLEE ) injection 5 Units  5 Units Subcutaneous Daily Elpidio Reyes DEL, MD   5 Units at 11/27/23 1304   lamoTRIgine  (LAMICTAL ) tablet 200 mg  200 mg Oral Daily Debby Hitch A, MD   200 mg at 11/27/23 1300   lipase/protease/amylase (CREON ) capsule 12,000 Units  12,000 Units Oral TID WC Debby Hitch LABOR, MD   12,000 Units at 11/28/23 0813   losartan  (COZAAR ) tablet 150 mg  150 mg Oral Daily Debby Hitch A, MD   150 mg at 11/27/23 1302   menthol -cetylpyridinium (CEPACOL) lozenge 3 mg  1 lozenge Oral PRN Opyd, Timothy S, MD   3 mg at 11/27/23 0630   metoCLOPramide  (REGLAN ) tablet 10 mg  10 mg Oral Q8H PRN Debby Hitch LABOR, MD   10 mg at 11/26/23 1445   OLANZapine  zydis (ZYPREXA ) disintegrating tablet 5 mg  5 mg Oral QHS Debby Hitch A, MD   5 mg at 11/27/23 2140   sevelamer  carbonate (RENVELA ) tablet 2,400 mg  2,400 mg Oral TID WC Debby Hitch A, MD   2,400 mg at 11/28/23 0813   sodium bicarbonate  tablet 1,300 mg  1,300 mg Oral BID Debby Hitch LABOR, MD   1,300 mg at 11/27/23 2140      Review of Systems: 10 systems reviewed and negative except per interval history/subjective  Physical Exam: Vitals:   11/28/23 0501 11/28/23 0720  BP: (!) 122/45 133/86  Pulse: 76 76  Resp: 13 10  Temp: 98.1 F (36.7 C) 97.7 F (36.5 C)  SpO2: 96% 96%   Total I/O In: 120 [P.O.:120] Out: -   Intake/Output Summary (Last 24 hours) at 11/28/2023 9041 Last data filed at 11/28/2023 9140 Gross per 24 hour  Intake 240 ml  Output 3200 ml  Net -2960 ml   Constitutional: Chronically ill appearing, lying in bed, no distress ENMT: ears and nose without scars or lesions, MMM CV: normal rate, 1+ edema Respiratory: Normal chest rise, normal work of breathing Gastrointestinal: Nontender, moderately distended Skin: no visible lesions or rashes Psych: alert, judgement/insight appropriate, appropriate mood and affect   Test Results I personally reviewed new and  old clinical labs and radiology tests Lab Results  Component Value Date   NA 136 11/28/2023   K 4.4 11/28/2023   CL 98 11/28/2023   CO2 26 11/28/2023   BUN 34 (H) 11/28/2023   CREATININE 6.84 (H) 11/28/2023   GFR 27.98 (L) 05/24/2021   CALCIUM  8.0 (L) 11/28/2023   ALBUMIN  2.3 (L) 11/28/2023   PHOS 5.0 (H) 11/28/2023    CBC Recent Labs  Lab 11/26/23 0045 11/26/23 0214 11/26/23 0611 11/26/23 0621 11/27/23 0240 11/28/23 0233  WBC 10.1  --  10.1  --  11.1* 9.2  NEUTROABS 6.9  --   --   --   --   --  HGB 9.4*   < > 9.0* 10.9* 10.6* 9.2*  HCT 32.9*   < > 29.5* 32.0* 34.3* 30.1*  MCV 96.8  --  90.2  --  88.6 90.4  PLT 265  --  238  --  324 325   < > = values in this interval not displayed.

## 2023-11-28 NOTE — Progress Notes (Addendum)
 Contacted by attending and nephrologist with request of OP HD appointment tomorrow. Spoke to clinic for availability and advised that pt will need to arrive at 10:45, with 11 am chair time. Spoke with pt at bedside, she was agreeable and stated she will have medicaid transport take her to this appointment tomorrow. Called clinic and confirmed. Update provided to attending and nephrologist. Will add upcoming appointment to AVS.   Ashley Freeman Dialysis Navigator [phone]  Addendum late note entry- 3:12 Contacted clinic to inform of pt d/c and expected arrival tomorrow. Scanned over last progress note. Will fax d/c summary when available.   Addendum late note entry 4:24 D/c summary faxed to clinic.

## 2023-11-28 NOTE — Discharge Summary (Signed)
 DISCHARGE SUMMARY  Ashley Freeman  MR#: [id number]  DOB:05-07-1990  Date of Admission: 11/26/2023 Date of Discharge: 11/28/2023  Attending Physician:Jailan Trimm ONEIDA Moores, MD  Patient's ERE:[name], [name], MD  Disposition: D/C home   Follow-up Appts:  Follow-up Information     Dialysis, [facility]. Go to.   Why: Dialysis appointment 11/29/23, 11am chair time, please arrive to [facility] at 10:45. Contact information: [address] [place] [address] [phone]         [name], [name], MD Follow up.   Specialty: Family Medicine Why: Please follow up in a week. Contact information: [address] [place] [address] [phone]                 Tests Needing Follow-up: -assure labial lesion has fully healed -assess CBG control and compliance with CBG testing and insulin  dosing  -assess compliance with HD and improvement in ascites with ongoing HD tx  Discharge Diagnoses: DKA in uncontrolled DM1 Acute exacerbation of chronic systolic CHF ESRD on HD TTS Anemia of CKD Abdominal distention due to ascites Ulcerated labial lesion HTN Bipolar disorder Seizure disorder  Initial presentation: 33 year old with a history of ESRD on HD TTS, seizure disorder, bipolar 1 disorder, DM1, systolic CHF with EF 35%, and frequent hospitalizations who presented to the ER via EMS due to severely elevated CBGs at home as well as abdominal distention and a painful boil on her labia.  In the ER she was found to be in DKA.   Hospital Course:  DKA and DM1 Due to noncompliance with medical therapy - off insulin  drip since 7/29 -remains out of DKA at present with CBG well-controlled on current regimen -patient educated on absolute need to follow her CBGs at home and to comply strictly with her insulin  dosing   Acute exacerbation of chronic systolic CHF Due to noncompliance with HD which is her only means of volume control - encouraged patient to  fully comply with dialysis - ongoing volume removal per HD    ESRD on HD TTS Long history of poor compliance - ongoing dialysis per Nephrology - plan was to dialyze as inpatient again 7/31 but scheduling would not allow - Neph Team arranged for outpt extra HD session at her usual unit 8/1 - pt is being discharged for ongoing outpt HD -she has been counseled on the absolute need to comply strictly with her dialysis treatments fully   Anemia of CKD Hemoglobin stable at present -monitor trend intermittently   Abdominal distention Likely ascites due to poor compliance with dialysis -monitor with ongoing dialysis -if does not improve will need to consider paracentesis   Ulcerated labial lesion Presumed boil but cannot rule out HSV - continue topical empiric antibiotic -patient offered follow-up exam prior to discharge but she politely declined -she stated the lesion had opened on its own and was now basically gone -she was advised to keep an eye on the area and to maintain strict hygiene to assure that she did not develop a secondary infection    HTN Blood pressure controlled after volume removal with dialysis   Bipolar disorder Continue her usual home medications   Seizure disorder Continue her usual home medications  Allergies as of 11/28/2023       Reactions   Cephalexin  Anaphylaxis, Other (See Comments)   Has gotten ceftriaxone  in the past    Penicillins Hives, Rash   Benadryl  [diphenhydramine ] Itching   Dilaudid  [hydromorphone ] Itching   Doxycycline  Itching   Methotrexate  Derivatives Rash   Oxycodone  Itching        Medication List     TAKE these medications    albuterol  108 (90 Base) MCG/ACT inhaler Commonly known as: VENTOLIN  HFA Inhale 2 puffs into the lungs every 4 (four) hours as needed for wheezing or shortness of breath.   atorvastatin  80 MG tablet Commonly known as: LIPITOR  Take 1 tablet (80 mg total) by mouth daily.   bumetanide  2 MG tablet Commonly known  as: BUMEX  Take 2 mg by mouth daily.   calcitRIOL  0.25 MCG capsule Commonly known as: ROCALTROL  Take 5 capsules (1.25 mcg total) by mouth every Tuesday, Thursday, and Saturday at 6 PM.   carvedilol  12.5 MG tablet Commonly known as: COREG  Take 1 tablet (12.5 mg total) by mouth 2 (two) times daily with a meal.   cyclobenzaprine  5 MG tablet Commonly known as: FLEXERIL  Take 1 tablet (5 mg total) by mouth 3 (three) times daily as needed for muscle spasms.   Dexcom G7 Sensor Misc Change sensors every 10 days What changed:  how much to take how to take this when to take this additional instructions   dicyclomine  20 MG tablet Commonly known as: BENTYL  Take 20 mg by mouth 2 (two) times daily as needed for spasms.   DULoxetine  20 MG capsule Commonly known as: CYMBALTA  Take 20 mg by mouth 2 (two) times daily.   famotidine  20 MG tablet Commonly known as: PEPCID  Take 20 mg by mouth daily before breakfast.   HYDROmorphone  2 MG tablet Commonly known as: DILAUDID  Take 2 mg by mouth every 4 (four) hours as needed for severe pain (pain score 7-10).   hydrOXYzine  25 MG tablet Commonly known as: ATARAX  Take 25 mg by mouth 3 (three) times daily as needed for anxiety, itching, nausea or vomiting.   insulin  aspart 100 UNIT/ML injection Commonly known as: novoLOG  Inject 5 Units into the skin 3 (three) times daily with meals.   lamoTRIgine  200 MG tablet Commonly known as: LAMICTAL  Take 1 tablet (200 mg total) by mouth daily.   Lantus  SoloStar 100 UNIT/ML Solostar Pen Generic drug: insulin  glargine Inject 10 Units into the skin in the morning.   losartan  100 MG tablet Commonly known as: COZAAR  Take 150 mg by mouth daily.   metoCLOPramide  10 MG tablet Commonly known as: REGLAN  Take 1 tablet (10 mg total) by mouth every 8 (eight) hours as needed for nausea.   OLANZapine  zydis 5 MG disintegrating tablet Commonly known as: ZYPREXA  Take 5 mg by mouth at bedtime.   ondansetron  4 MG  disintegrating tablet Commonly known as: ZOFRAN -ODT Take 1 tablet (4 mg total) by mouth every 8 (eight) hours as needed for nausea or vomiting.   Pancrelipase  (Lip-Prot-Amyl) 3000-9500 units Cpep Take 3,000 units of lipase by mouth 3 (three) times daily.   sevelamer  carbonate 800 MG tablet Commonly known as: RENVELA  Take 2,400 mg by mouth 3 (three) times daily with meals.   sodium bicarbonate  650 MG tablet Take 1,300 mg by mouth 2 (two) times daily.   SUMAtriptan  50 MG tablet Commonly known as: IMITREX  Take 50 mg by mouth every 2 (two) hours as needed for migraine or headache.        Day of Discharge BP 126/87 (BP Location: Left Leg) Comment: Simultaneous filing. User may not have seen previous data.  Pulse 76 Comment: Simultaneous filing. User may not have seen previous data.  Temp 98 F (36.7 C) (Oral)   Resp 16   Ht 5' 3 (  1.6 m)   Wt 71.9 kg   LMP 01/29/2020 (Approximate)   SpO2 95% Comment: Simultaneous filing. User may not have seen previous data.  BMI 28.08 kg/m   Physical Exam: General: No acute respiratory distress Lungs: Clear to auscultation bilaterally without wheezes or crackles Cardiovascular: Regular rate and rhythm without murmur gallop or rub normal S1 and S2 Abdomen: Nontender, nondistended, soft, bowel sounds positive, no rebound, no ascites, no appreciable mass Extremities: No significant cyanosis, clubbing, or edema bilateral lower extremities  Basic Metabolic Panel: Recent Labs  Lab 11/26/23 0045 11/26/23 0214 11/26/23 0611 11/26/23 0621 11/26/23 2135 11/27/23 0240 11/28/23 0233  NA 122* 124* 128* 127* 129* 130* 136  K 3.9 3.9 3.4* 3.4* 4.3 4.3 4.4  CL 86* 90* 94*  --  96* 95* 98  CO2 18*  --  15*  --  19* 20* 26  GLUCOSE 1,132* >700* 841*  --  195* 157* 169*  BUN 46* 41* 46*  --  50* 49* 34*  CREATININE 8.31* 7.90* 8.47*  --  8.23* 8.42* 6.84*  CALCIUM  7.5*  --  7.8*  --  8.1* 8.3* 8.0*  PHOS  --   --   --   --   --   --  5.0*     CBC: Recent Labs  Lab 11/26/23 0045 11/26/23 0214 11/26/23 0611 11/26/23 0621 11/27/23 0240 11/28/23 0233  WBC 10.1  --  10.1  --  11.1* 9.2  NEUTROABS 6.9  --   --   --   --   --   HGB 9.4* 11.2* 9.0* 10.9* 10.6* 9.2*  HCT 32.9* 33.0* 29.5* 32.0* 34.3* 30.1*  MCV 96.8  --  90.2  --  88.6 90.4  PLT 265  --  238  --  324 325    Time spent in discharge (includes decision making & examination of pt): 35 minutes  11/28/2023, 2:00 PM   Reyes IVAR Moores, MD [facility]  [phone]

## 2023-11-28 NOTE — Progress Notes (Signed)
 Mobility Specialist Progress Note:    11/28/23 1205  Mobility  Activity Ambulated independently  Level of Assistance Independent after set-up  Assistive Device None  Distance Ambulated (ft) 300 ft  Activity Response Tolerated well  Mobility Referral Yes  Mobility visit 1 Mobility  Mobility Specialist Start Time (ACUTE ONLY) 1205  Mobility Specialist Stop Time (ACUTE ONLY) 1214  Mobility Specialist Time Calculation (min) (ACUTE ONLY) 9 min   Pt agreeable to session. Able to move independently on her own. Mood was pleasant and stated she was feeling a lot better. Left pt in room on EOB w/ all needs met.   Venetia Keel Mobility Specialist Please Neurosurgeon or Rehab Office at [phone]

## 2023-11-28 NOTE — TOC Initial Note (Signed)
 Transition of Care [facility]) - Initial/Assessment Note    Patient Details  Name: Ashley Freeman MRN: [id number] Date of Birth: 1990-12-19  [facility]) CM/SW Contact:    Waddell Barnie Rama, RN Phone Number: 11/28/2023, 11:51 AM  Clinical Narrative:                 From home with mom, has PCP and insurance on file, states has no HH services in place at this time , has walker at home.  States family member  (mom) will transport them home at [employer] and family is support system, states gets medications from [facility] at [place].  Pta self ambulatory with walker.   There are no IP CM  needs identified  at this time.  Please place consult for IP CM needs.    Expected Discharge Plan: Home/Self Care Barriers to Discharge: Continued Medical Work up   Patient Goals and CMS Choice Patient states their goals for this hospitalization and ongoing recovery are:: return home   Choice offered to / list presented to : NA      Expected Discharge Plan and Services In-house Referral: NA Discharge Planning Services: CM Consult Post Acute Care Choice: NA Living arrangements for the past 2 months: Single Family Home                 DME Arranged: N/A DME Agency: NA       HH Arranged: NA HH Agency: NA        Prior Living Arrangements/Services Living arrangements for the past 2 months: Single Family Home Lives with:: Parents Patient language and need for interpreter reviewed:: Yes Do you feel safe going back to the place where you live?: Yes      Need for Family Participation in Patient Care: Yes (Comment) Care giver support system in place?: Yes (comment) Current home services: DME (walker) Criminal Activity/Legal Involvement Pertinent to Current Situation/Hospitalization: No - Comment as needed  Activities of Daily Living      Permission Sought/Granted Permission sought to share information with : Case Manager Permission granted to share information with : Yes,  Verbal Permission Granted  Share Information with NAME: mom           Emotional Assessment Appearance:: Appears stated age Attitude/Demeanor/Rapport: Engaged Affect (typically observed): Appropriate Orientation: : Oriented to Self, Oriented to Place, Oriented to  Time, Oriented to Situation Alcohol / Substance Use: Not Applicable Psych Involvement: No (comment)  Admission diagnosis:  DKA (diabetic ketoacidosis) (HCC) [E11.10] ESRD (end stage renal disease) (HCC) [N18.6] Diabetic ketoacidosis without coma associated with type 1 diabetes mellitus (HCC) [E10.10] Hypervolemia, unspecified hypervolemia type [E87.70] Patient Active Problem List   Diagnosis Date Noted   History of seizure disorder 11/17/2023   Hyperosmolar hyperglycemic state (HHS) (HCC) 11/03/2023   Volume overload 11/03/2023   Vulvar pain 11/03/2023   Generalized abdominal pain 08/27/2023   Elevated LFTs 07/20/2023   Cholecystitis 06/30/2023   Prolonged QT interval 06/30/2023   Bipolar disorder (HCC) 06/30/2023   Hyperglycemic crisis due to Type 1 diabetes mellitus (HCC) 05/20/2023   HFrEF (heart failure with reduced ejection fraction) (HCC) 05/20/2023   Acute on chronic combined systolic and diastolic CHF (congestive heart failure) (HCC) 05/20/2023   Generalized pain 05/20/2023   Hypoglycemia 09/08/2022   Dilated cardiomyopathy (HCC) 09/08/2022   Seizures (HCC) 09/08/2022   ESRD on hemodialysis (HCC) 09/08/2022   Altered mental status 09/07/2022   Type 1 diabetes mellitus with retinopathy (HCC) 06/22/2022   Unemployed 03/31/2022  Housing instability, currently housed 03/31/2022   Anemia due to stage 5 chronic kidney disease, not on chronic dialysis (HCC) 12/23/2021   LV dysfunction 11/15/2021   Scalp lesion 11/15/2021   C. difficile diarrhea 11/15/2021   SIRS (systemic inflammatory response syndrome) (HCC) 11/06/2021   LGSIL on Pap smear of cervix 09/24/2021   DKA (diabetic ketoacidosis) (HCC) 07/26/2021    Essential hypertension 07/26/2021   N&V (nausea and vomiting) 06/20/2021   Type 1 diabetes mellitus with chronic kidney disease on chronic dialysis (HCC) 05/26/2021   Dyslipidemia 05/26/2021   Anasarca 04/16/2021   Mild protein malnutrition (HCC) 03/07/2021   DKA, type 1 (HCC) 12/08/2020   Type 1 diabetes mellitus (HCC) 02/08/2020   Leg edema, left 09/27/2019   Systolic ejection murmur 08/03/2019   Type 1 diabetes mellitus with hyperglycemia (HCC) 07/24/2019   Diabetic retinopathy (HCC) 07/02/2019   DM (diabetes mellitus), type 1 with renal complications (HCC) 05/21/2019   Diarrhea 05/31/2016   Diabetic neuropathy (HCC) 01/16/2016   Preop cardiovascular exam 08/24/2013   Pseudohyponatremia 08/04/2012   Hypokalemia 05/07/2012   Lactic acidosis 02/19/2012   Leukocytosis 02/19/2012   Normocytic anemia 02/19/2012   Hyperkalemia 02/19/2012   Stable proliferative diabetic retinopathy associated with type 1 diabetes mellitus (HCC) 10/13/1997   PCP:  Keven Crumbly Pap, MD Pharmacy:   [id number] - [place] - [address] [place] [address] Phone: [phone] Fax: [phone]  [facility] [address] [place] [address] Phone: [phone] Fax: [phone]     Social Drivers of Health (SDOH) Social History: SDOH Screenings   Food Insecurity: No Food Insecurity (11/27/2023)  Housing: Low Risk  (11/27/2023)  Transportation Needs: Unmet Transportation Needs (11/27/2023)  Utilities: Not At Risk (11/27/2023)  Depression (PHQ2-9): Medium Risk (03/02/2020)  Financial Resource Strain: Medium Risk (11/15/2023)   Received from [facility]  Physical Activity: Insufficiently Active (03/02/2022)   Received from [facility]  Social Connections: Moderately Integrated (08/28/2023)  Stress: No Stress Concern Present (03/08/2023)   Received from [facility]  Tobacco Use: Low Risk   (11/17/2023)   SDOH Interventions:     Readmission Risk Interventions    11/28/2023   11:49 AM 10/29/2023   10:23 AM 07/23/2023    4:25 PM  Readmission Risk Prevention Plan  Transportation Screening Complete Complete Complete  Medication Review (RN Care Manager) Complete Complete Referral to Pharmacy  PCP or Specialist appointment within 3-5 days of discharge Complete Complete Complete  HRI or Home Care Consult Complete Complete Complete  SW Recovery Care/Counseling Consult  Complete Complete  Palliative Care Screening Not Applicable Not Applicable Not Applicable  Skilled Nursing Facility Not Applicable Not Applicable Not Applicable

## 2023-11-28 NOTE — Plan of Care (Signed)
  Problem: Education: Goal: Ability to describe self-care measures that may prevent or decrease complications (Diabetes Survival Skills Education) will improve Outcome: Adequate for Discharge   Problem: Coping: Goal: Ability to adjust to condition or change in health will improve Outcome: Adequate for Discharge   Problem: Education: Goal: Individualized Educational Video(s) Outcome: Adequate for Discharge

## 2023-11-28 NOTE — TOC Transition Note (Signed)
 Transition of Care [facility]) - Discharge Note   Patient Details  Name: KAELANI KENDRICK MRN: [id number] Date of Birth: [birth year]  Transition of Care [facility]) CM/SW Contact:  Waddell Barnie Rama, RN Phone Number: 11/28/2023, 2:08 PM   Clinical Narrative:    For dc today, has no needs. Mom will transport home.   Final next level of care: Home/Self Care Barriers to Discharge: Continued Medical Work up   Patient Goals and CMS Choice Patient states their goals for this hospitalization and ongoing recovery are:: return home   Choice offered to / list presented to : NA      Discharge Placement                       Discharge Plan and Services Additional resources added to the After Visit Summary for   In-house Referral: NA Discharge Planning Services: CM Consult Post Acute Care Choice: NA          DME Arranged: N/A DME Agency: NA       HH Arranged: NA HH Agency: NA        Social Drivers of Health (SDOH) Interventions SDOH Screenings   Food Insecurity: No Food Insecurity (11/27/2023)  Housing: Low Risk  (11/27/2023)  Transportation Needs: Unmet Transportation Needs (11/27/2023)  Utilities: Not At Risk (11/27/2023)  Depression (PHQ2-9): Medium Risk (03/02/2020)  Financial Resource Strain: Medium Risk (11/15/2023)   Received from [facility]  Physical Activity: Insufficiently Active (03/02/2022)   Received from [facility]  Social Connections: Moderately Integrated (08/28/2023)  Stress: No Stress Concern Present (03/08/2023)   Received from [facility]  Tobacco Use: Low Risk  (11/17/2023)     Readmission Risk Interventions    11/28/2023   11:49 AM 10/29/2023   10:23 AM 07/23/2023    4:25 PM  Readmission Risk Prevention Plan  Transportation Screening Complete Complete Complete  Medication Review (RN Care Manager) Complete Complete Referral to Pharmacy  PCP or Specialist appointment within 3-5 days of discharge Complete Complete Complete  HRI or  Home Care Consult Complete Complete Complete  SW Recovery Care/Counseling Consult  Complete Complete  Palliative Care Screening Not Applicable Not Applicable Not Applicable  Skilled Nursing Facility Not Applicable Not Applicable Not Applicable

## 2023-12-03 NOTE — Discharge Summary (Signed)
 ------------------------------------------------------------------------------- Attestation signed by Beverley Clotilda Planas, MD at 12/05/23 2129 I saw and evaluated the patient, participating in the key portions of the service on the day of discharge.  I reviewed the resident's note and agree with the discharge plans and disposition. I personally spent <30 minutes in discharge planning services. Clotilda Planas Rams, MD  -------------------------------------------------------------------------------   Physician Discharge Summary [facility] [address] 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 *   Hospital Course:  Ashley Freeman is a 33 y.o. female whose presentation is complicated by T1DM, ESRD on TTS HD, HFrEF (EF 25-30% on echo 08/2023), HTN, HLD, seizures, and bipolar disorder that presented to [facility] with abdominal pain found to be volume overloaded in setting of missed HD session (last session 7/30). Transferred to [facility] with need for dialysis.      ESRD on iHD TTS  Volume Overload  Hyperkalemia  Hyperphosphatemia  Patient with multiple recent admissions for the same. Outpatient  HD schedule is TTS. Reports dry weight of 57kg and was 62 kg after last HD session 7/30. Hyperkalemia to 5.0 on presentation, increased to 6.0 prior to HD and received lokelma  and IV calcium  gluconate. BUN 53. Without crackles on lung exam. In much discomfort from fluid overload but stable at this time. HD 8/4 and 8/5. Paracentesis 8/4 with 4.1L yellow ascites fluid; studies showed hazy fluid with but low suspicion for SBP. Received home sevelamer  with meals.   Abdominal Pain  Nausea  Poor PO Secondary to volume overload as above. Received IV morphine  and dilaudid  in ED. Diagnostic paracentesis was attempted in ED but fluid could not be drawn. Re-attempt on floor and was successful, took off 4.125 liters. Ascitic fluid without concern for SBP.  Initially managed pain control with dose reduced Tylenol  for mild pain ISO congestive hepatopathy, PO Dilaudid  1 mg second line, and IV Dilaudid  0.5 mg third line for moderate or severe pain respectively. Discontinued opioid pain management at discharge. Paracentesis as above with some improvement of symptoms.   T1DM  Recent admission to [facility] for DKA 7/10. A1C 11.1 5/16. Blood glucose 637 on this presentation to [facility]. Not acidotic, betahydroxybuterate in ED 1.69->0.30. Pt received 5U of regular insulin  and 12U of lispro in the ED around noon today. BGs swinging from 400s-80s. Will restart long-acting. Reports home regimen is 10U lantus  in the AM, lispro 5U with meals, and SSI. Patient taking poor PO at this time. Titrated back to regimen of 10U lantus  AM, 5U with meals, and SSI.   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 Echo 09/14/2023 with Ef of 25-30% and G1DD and severe tricuspid regurgitation. BP increased at 177/96  on presentation but improves to 130s-150s systolic after transfer. Continued Carvedilol  25mg , amlodipine  10 mg, bumex  6 mg in the morning and 4 mg in the evening. Pt previously on losartan  150 mg daily, not currently on but would likely  benefit from a ACEi/ARB. Continued Lipitor  80mg  daily (formulary equivalent of home rosuvastatin  40mg  daily).   Prolonged QTc Patient with a prolonged QTc [name] to 485 on presentation, similar to prior. Patient has chronically had a more prolonged QTc and is on QTc prolonging medications. QTc 8/4 >500. Monitored with daily EKG and limited QTc prolonging medications as able (held zofran , zyprexa ). Discontinued Zofran  and Atarax  at discharge. Patient should continue taking Zyprexa .   Anemia Patient anemic to 10.3 on presentation. Per [facility] records, 7/21 FERRITIN 2,586, TIBC 283, IRON  126. Likely related to kidney disease and chronic illness.  Monitored with daily CBC.   Elevated Liver Enzymes  Likely Congestive Hepatopathy AST, ALT, and alk phos chronically elevated but above baseline on admission. Of note patient has had a cholecystectomy.  Patient has had congestive hepatopathy with volume overload in the past which resolves with volume removal.  Most recent CTAP 7/9 shows hepatomegaly with heterogenous appearance of the liver which could be secondary to passive congestion, or potential hepatic inflammation/hepatocellular disease. Daily CMP showed trend improving.    SDOH  Frequent Hospitalizations Patient has been hospitalized frequently since the beginning of the year usually due to missed dialysis sessions, which she reports is due to lack of childcare. Patient states her daughter is set to be enrolled in daycare when patient returns from hospitalization. Social work was consulted who did not identify discharge needs.   Chronic Problems   GERD Adjusted famotidine  dosing to 10 mg daily per pharmacy recs iso ESRD.   Pancreatic Insufficiency Patient was recently started on Creon  which she has been taking at home.  She has had multiple low fecal elastase of this in the past. Continue home Creon .   Depression  Bipolar disorder Continued home Cymbalta . Held Zyprexa  iso prolonged QTc.    Secondary Hyperparathyroidism  Continued calcitriol  with dialysis.   Seizure Hx Last event noted in chart was in 2022. Patient reports she is taking her home lamotrigine  with last dose on day before admission. Continued home lamotrigine . The patient's hospital stay has been complicated by the following clinically significant conditions requiring additional evaluation and treatment or having a significant effect of this patient's care: - Chronic kidney disease POA requiring further investigation, treatment, or monitoring  Nutrition Assessment:              Outpatient Provider Follow Up Issues:  Repeat EKG outpatient (prolonged Qtc) T1DM management Missed HD sessions Elevated liver enzymes (outpatient work up and possible abdominal imaging)  Touchbase with Outpatient Provider: Warm Handoff: Completed on 12/03/23 by Silva Query, MD  (Intern) via Epic Secure Chat  Procedures:   ______________________________________________________________________ Discharge Medications:    Your Medication List     STOP taking these medications    hydrOXYzine  25 MG tablet Commonly known as: ATARAX    ondansetron  4 MG disintegrating tablet Commonly known as: ZOFRAN -ODT       CHANGE how you take these medications    famotidine  10 MG tablet Commonly known as: PEPCID  Take 1 tablet (10 mg total) by mouth daily. What changed:  medication strength how much to take       CONTINUE taking these medications    ACCU-CHEK GUIDE TEST STRIPS Strp Generic drug: blood sugar diagnostic Test four (4) times a day (  before meals and nightly).   ACCU-CHEK SOFTCLIX LANCETS lancets Generic drug: lancets Test four (4) times a day (before meals and nightly).   amlodipine  10 MG tablet Commonly known as: NORVASC  Take 1 tablet (10 mg total) by mouth.   BAQSIMI  3 mg/actuation Spry Generic drug: glucagon  spray Spray 1 spray (3 mg) into one nostril once as needed (severe hypoglycemia) for up to 1  dose. If no response after , can repeat using a new intranasal device.   blood-glucose meter kit Use as instructed   bumetanide  2 MG tablet Commonly known as: BUMEX  Take 3 tablets (6 mg total) by mouth every morning AND 2 tablets (4 mg total) every evening.   carvedilol  25 MG tablet Commonly known as: COREG  Take 1 tablet (25 mg total) by mouth two (2) times a day.   CLEARLAX 17 gram packet Generic drug: polyethylene glycol Take 17 g by mouth daily as needed.   CREON  3,000-9,500- 15,000 unit Cpdr capsule, delayed release Generic drug: pancrelipase  (Lip-Prot-Amyl) Take 1 capsule (3,000 units of lipase total) by mouth Three (3) times a day with a meal.   DEXCOM G7 SENSOR Devi Generic drug: blood-glucose sensor Change sensor every 10 days   DEXCOM G7 [employer] Generic drug: blood-glucose sensor Change sensors every 10 days. (Change sensors every 10 days.)   DULoxetine  20 MG capsule Commonly known as: CYMBALTA  Take 2 capsules (40 mg total) by mouth daily.   glucose 4 GM chewable tablet Chew 4 tablets (16 g total) by mouth once as needed for low blood sugar for up to 1 dose.   insulin  aspart 100 unit/mL (3 mL) injection pen Commonly known as: NovoLOG  Flexpen U-100 Insulin  Inject 0.05 mL (5 Units total) under the skin Three (3) times a day before meals. If you do not eat a meal, please check your blood sugar and give yourself 1 unit of insulin  for every 50 MG/DL greater than 869. Max of 30 units per day.   lamoTRIgine  200 MG tablet Commonly known as: LaMICtal  Take 1 tablet (200 mg total) by mouth daily.   LANTUS  SOLOSTAR U-100 INSULIN  100 unit/mL (3 mL) injection pen Generic drug: insulin  glargine Inject 0.1 mL (10 Units total) under the skin in the morning. Discard remainder of pen 28 days after first use.   OLANZapine  zydis 5 MG disintegrating tablet Commonly known as: ZYPREXA  Take 1 tablet (5 mg total) by mouth nightly. May take an additional 1/2 tablet  (2.5mg ) daily as needed for anxiety or for help with sleep.   prochlorperazine  5 MG tablet Commonly known as: COMPAZINE  Take 1 tablet (5 mg total) by mouth every six (6) hours as needed for nausea.   rosuvastatin  40 MG tablet Commonly known as: CRESTOR  Take 1 tablet (40 mg total) by mouth daily.   sevelamer  800 mg tablet Commonly known as: RENVELA  Take 3 tablets (2,400 mg total) by mouth Three (3) times a day with meals.   sumatriptan  50 MG tablet Commonly known as: IMITREX  Take 1 tablet (50 mg total) by mouth as soon as you start to develop signs of a headache. If symptoms persist or return, may repeat dose (usually same as first dose) after 2 hours. Maximum dose: 4 tablets per 24 hours   UNIFINE PENTIPS 32 gauge x 5/32 (4 mm) Ndle Generic drug: pen needle, diabetic Use with insulin  up to 4 times/day as needed.   VENTOLIN  HFA 90 mcg/actuation inhaler Generic drug: albuterol  Inhale 2 puffs every four (4) hours as needed for shortness  of breath or wheezing.        Allergies: Cephalexin , Penicillins, Opioids - morphine  analogues, Diphenhydramine , Doxycycline , Methotrexate, Oxycodone , and Sacubitril-valsartan ______________________________________________________________________ Pending Test Results: Pending Labs     Order Current Status   Ascitic/Peritoneal Fluid Culture Preliminary result       Most Recent Labs: All lab results last 24 hours -  Recent Results (from the past 24 hours)  Body fluid cell count   Collection Time: 12/02/23  3:26 PM  Result Value Ref Range   Fluid Type Fluid, Peritoneal    Color, Fluid Yellow    Appearance, Fluid Hazy    Nucleated Cells, Fluid 875 Undefined ul   RBC, Fluid 1,809 Undefined ul   Neutrophil %, Fluid 9.0 %   Lymphocytes %, Fluid 27.0 %   Mono/Macro % , Fluid 53.0 %   Other Cells %, Fluid 11.0 %   #Cells Counted BF Diff 100    Fluid Comments      Degenerating cells present. Macrophages present. Mesothelial cells  present.  Protein, Body Fluid   Collection Time: 12/02/23  3:26 PM  Result Value Ref Range   Protein, Fluid 3.1 g/dL   Protein, Fluid Type Fluid, Peritoneal   Lactate Dehydrogenase, Body Fluid   Collection Time: 12/02/23  3:26 PM  Result Value Ref Range   LDH, Fluid 167 Undefined U/L   LD, Fluid Type Fluid, Peritoneal   Body fluid, pathologist review   Collection Time: 12/02/23  3:26 PM  Result Value Ref Range   Pathologist Body Fluid Interp Confirmed by Hemepath Specialist/Senior Tech    Fluid Type Fluid, Peritoneal   Ascitic/Peritoneal Fluid Culture   Collection Time: 12/02/23  4:08 PM   Specimen: Peritoneum; Fluid, Peritoneal  Result Value Ref Range   Ascitic/Peritoneal Culture NO GROWTH TO DATE   POCT Glucose   Collection Time: 12/02/23  4:46 PM  Result Value Ref Range   Glucose, POC 164 70 - 179 mg/dL  Basic Metabolic Panel   Collection Time: 12/02/23  5:48 PM  Result Value Ref Range   Sodium 136 135 - 145 mmol/L   Potassium 4.2 3.4 - 4.8 mmol/L   Chloride 98 98 - 107 mmol/L   CO2 24.0 20.0 - 31.0 mmol/L   Anion Gap 14 5 - 14 mmol/L   BUN 21 9 - 23 mg/dL   Creatinine 4.82 (H) 9.44 - 1.02 mg/dL   BUN/Creatinine Ratio 4    eGFR CKD-EPI (2021) Female 11 (L) >=60 mL/min/1.37m2   Glucose 189 (H) 70 - 179 mg/dL   Calcium  9.4 8.7 - 10.4 mg/dL  POCT Glucose   Collection Time: 12/02/23 10:10 PM  Result Value Ref Range   Glucose, POC 224 (H) 70 - 179 mg/dL  CBC   Collection Time: 12/03/23  5:27 AM  Result Value Ref Range   WBC 9.2 3.6 - 11.2 10*9/L   RBC 3.17 (L) 3.95 - 5.13 10*12/L   HGB 9.0 (L) 11.3 - 14.9 g/dL   HCT 72.6 (L) 65.9 - 55.9 %   MCV 86.1 77.6 - 95.7 fL   MCH 28.3 25.9 - 32.4 pg   MCHC 32.9 32.0 - 36.0 g/dL   RDW 82.7 (H) 87.7 - 84.7 %   MPV 9.4 6.8 - 10.7 fL   Platelet 310 150 - 450 10*9/L  Magnesium  Level   Collection Time: 12/03/23  5:27 AM  Result Value Ref Range   Magnesium  2.4 1.6 - 2.6 mg/dL  Basic Metabolic Panel   Collection Time:  12/03/23  5:27 AM  Result Value Ref Range   Sodium 139 135 - 145 mmol/L   Potassium 4.2 3.4 - 4.8 mmol/L   Chloride 101 98 - 107 mmol/L   CO2 24.0 20.0 - 31.0 mmol/L   Anion Gap 14 5 - 14 mmol/L   BUN 26 (H) 9 - 23 mg/dL   Creatinine 4.20 (H) 9.44 - 1.02 mg/dL   BUN/Creatinine Ratio 4    eGFR CKD-EPI (2021) Female 9 (L) >=60 mL/min/1.52m2   Glucose 204 (H) 70 - 179 mg/dL   Calcium  9.2 8.7 - 10.4 mg/dL  Hepatic Function Panel   Collection Time: 12/03/23  5:27 AM  Result Value Ref Range   Albumin  3.1 (L) 3.4 - 5.0 g/dL   Total Protein 5.7 5.7 - 8.2 g/dL   Total Bilirubin 0.7 0.3 - 1.2 mg/dL   Bilirubin, Direct 9.49 (H) 0.00 - 0.30 mg/dL   AST 44 (H) <=65 U/L   ALT 49 10 - 49 U/L   Alkaline Phosphatase 847 (H) 46 - 116 U/L  POCT Glucose   Collection Time: 12/03/23  7:24 AM  Result Value Ref Range   Glucose, POC 219 (H) 70 - 179 mg/dL  ECG 12 Lead   Collection Time: 12/03/23  8:35 AM  Result Value Ref Range   EKG Systolic BP  mmHg   EKG Diastolic BP  mmHg   EKG Ventricular Rate 78 BPM   EKG Atrial Rate 78 BPM   EKG P-R Interval 152 ms   EKG QRS Duration 80 ms   EKG Q-T Interval 430 ms   EKG QTC Calculation 490 ms   EKG Calculated P Axis 25 degrees   EKG Calculated R Axis -3 degrees   EKG Calculated T Axis 141 degrees   QTC Fredericia 469 ms  POCT Glucose   Collection Time: 12/03/23  1:17 PM  Result Value Ref Range   Glucose, POC 151 70 - 179 mg/dL   Microbiology -  Microbiology Results (last day)     Procedure Component Value Date/Time Date/Time   Ascitic/Peritoneal Fluid Culture [[id number]]  (Normal) Collected: 12/02/23 1608   Lab Status: Preliminary result Specimen: Fluid, Peritoneal from Peritoneum Updated: 12/03/23 1310    Ascitic/Peritoneal Culture NO GROWTH TO DATE   Narrative:     Specimen Source: Peritoneum    Body fluid cell count [[id number]] Collected: 12/02/23 1526   Lab Status: Final result Specimen: Fluid, Peritoneal from Peritoneum Updated:  12/02/23 1833    Fluid Type Fluid, Peritoneal    Color, Fluid Yellow    Appearance, Fluid Hazy    Nucleated Cells, Fluid 875 ul     RBC, Fluid 1,809 ul     Neutrophil %, Fluid 9.0 %     Lymphocytes %, Fluid 27.0 %     Mono/Macro % , Fluid 53.0 %     Other Cells %, Fluid 11.0 %     #Cells Counted BF Diff 100    Fluid Comments Degenerating cells present. Macrophages present. Mesothelial cells present.   Protein, Body Fluid [[id number]] Collected: 12/02/23 1526   Lab Status: Final result Specimen: Fluid, Peritoneal from Peritoneum Updated: 12/02/23 1657    Protein, Fluid 3.1 g/dL     Comment: Pleural fluid protein to serum protein ratios >0.5 suggest exudative effusion. Pleural fluid protein >4.0 g/dL suggests a TB related effusion.  Pleural fluid protein 7-8 g/dL  suggests MM or Waldenstroms.       Protein, Fluid Type Fluid, Peritoneal   Narrative:  The reference range for this body fluid has not been established. The test result must be integrated into the clinical context for interpretation. This test was developed and its performance characteristics determined by the [facility] of the [employer], [employer]. This test has not been cleared or approved by the FDA. The laboratory is regulated under CAP and CLIA as qualified to perform high-complexity testing. This test is to be used for clinical purposes and should not be regarded as investigational or for research.   Lactate Dehydrogenase, Body Fluid [[id number]] Collected: 12/02/23 1526   Lab Status: Final result Specimen: Fluid, Peritoneal from Peritoneum Updated: 12/02/23 1657    LDH, Fluid 167 U/L     Comment: Pleural fluid LDH to serum LDH ratios >0.6 suggest exudative effusion.      LD, Fluid Type Fluid, Peritoneal   Narrative:     The reference range for this body fluid has not been established. The test result must be integrated into the clinical context for interpretation. This test was  developed and its performance characteristics determined by the [facility] of the [employer] [employer]. This test has not been cleared or approved by the FDA. The laboratory is regulated under CAP and CLIA as qualified to perform high-complexity testing. This test is to be used for clinical purposes and should not be regarded as investigational or for research.      CBC - Results in Past 30 Days Result Component Current Result Ref Range Previous Result Ref Range  HCT 27.3 (L) (12/03/2023) 34.0 - 44.0 % 27.6 (L) (12/02/2023) 34.0 - 44.0 %  HGB 9.0 (L) (12/03/2023) 11.3 - 14.9 g/dL 8.9 (L) (05/04/7972) 88.6 - 14.9 g/dL  MCH 71.6 (05/03/7972) 74.0 - 32.4 pg 28.7 (12/02/2023) 25.9 - 32.4 pg  MCHC 32.9 (12/03/2023) 32.0 - 36.0 g/dL 67.5 (05/04/7972) 67.9 - 36.0 g/dL  MCV 13.8 (05/03/7972) 22.3 - 95.7 fL 88.6 (12/02/2023) 77.6 - 95.7 fL  MPV 9.4 (12/03/2023) 6.8 - 10.7 fL 9.7 (12/02/2023) 6.8 - 10.7 fL  Platelet 310 (12/03/2023) 150 - 450 10*9/L 335 (12/02/2023) 150 - 450 10*9/L  RBC 3.17 (L) (12/03/2023) 3.95 - 5.13 10*12/L 3.12 (L) (12/02/2023) 3.95 - 5.13 10*12/L  WBC 9.2 (12/03/2023) 3.6 - 11.2 10*9/L 9.7 (12/02/2023) 3.6 - 11.2 10*9/L   BMP - Results in Past 30 Days Result Component Current Result Ref Range Previous Result Ref Range  BUN 26 (H) (12/03/2023) 9 - 23 mg/dL 21 (05/04/7972) 9 - 23 mg/dL  Chloride 898 (05/03/7972) 98 - 107 mmol/L 98 (12/02/2023) 98 - 107 mmol/L  CO2 24.0 (12/03/2023) 20.0 - 31.0 mmol/L 24.0 (12/02/2023) 20.0 - 31.0 mmol/L  Creatinine 5.79 (H) (12/03/2023) 0.55 - 1.02 mg/dL 4.82 (H) (05/04/7972) 9.44 - 1.02 mg/dL  Glucose 795 (H) (05/03/7972) 70 - 179 mg/dL 810 (H) (05/04/7972) 70 - 179 mg/dL  Potassium 4.2 (05/03/7972) 3.4 - 4.8 mmol/L 4.2 (12/02/2023) 3.4 - 4.8 mmol/L  Sodium 139 (12/03/2023) 135 - 145 mmol/L 136 (12/02/2023) 135 - 145 mmol/L   Coagulation - Results in Past 30 Days Result Component Current Result Ref Range Previous Result Ref Range  INR 1.36 (12/01/2023)  Not in Time Range    PT 15.5 (H) (12/01/2023) 9.9 - 12.6 sec Not in Time Range    Cardiac markers -  No results found for requested labs within last 30 days.   ABGs-  No results found for requested labs within last 30 days.   LFT's - Results in Past 30 Days Result Component  Current Result Ref Range Previous Result Ref Range  Albumin  3.1 (L) (12/03/2023) 3.4 - 5.0 g/dL 2.7 (L) (05/04/7972) 3.4 - 5.0 g/dL  Alkaline Phosphatase 152 (H) (12/03/2023) 46 - 116 U/L 932 (H) (12/02/2023) 46 - 116 U/L  ALT 49 (12/03/2023) 10 - 49 U/L 59 (H) (12/02/2023) 10 - 49 U/L  AST 44 (H) (12/03/2023) <=34 U/L 114 (H) (12/02/2023) <=34 U/L  Bilirubin, Direct 0.50 (H) (12/03/2023) 0.00 - 0.30 mg/dL 9.39 (H) (05/04/7972) 9.99 - 0.30 mg/dL  Total Bilirubin 0.7 (05/03/7972) 0.3 - 1.2 mg/dL 0.8 (05/04/7972) 0.3 - 1.2 mg/dL   Toxicology screen -  No results found for requested labs within last 30 days.  [    Relevant Studies/Radiology: ECG 12 Lead Result Date: 12/03/2023 NORMAL SINUS RHYTHM LEFT VENTRICULAR HYPERTROPHY WITH REPOLARIZATION ABNORMALITY ( R in aVL ) QTcB >= 480 msec ABNORMAL ECG WHEN COMPARED WITH ECG OF 02-Dec-2023 08:54, NONSPECIFIC T WAVE ABNORMALITY, WORSE IN LATERAL LEADS  ECG 12 Lead Result Date: 12/02/2023 NORMAL SINUS RHYTHM POSSIBLE ANTEROLATERAL INFARCT  (CITED ON OR BEFORE 13-Sep-2023) PROLONGED QT ABNORMAL ECG WHEN COMPARED WITH ECG OF 02-Dec-2023 06:19, QRS DURATION HAS DECREASED NONSPECIFIC T WAVE ABNORMALITY, WORSE IN LATERAL LEADS QT HAS SHORTENED  ECG 12 Lead Result Date: 12/02/2023 NORMAL SINUS RHYTHM POOR R WAVE PROGRESSION IN ANTERIOR PRECORDIAL LEADS PROLONGED QT ABNORMAL ECG WHEN COMPARED WITH ECG OF 15-Nov-2023 02:53, NO SIGNIFICANT CHANGE WAS FOUND Confirmed by Cleotilde Moccasin ([id number]) on 12/02/2023 9:07:00 AM  ECG 12 Lead Result Date: 12/02/2023 ** Critical Test Result: Long QTc NORMAL SINUS RHYTHM CANNOT RULE OUT ANTERIOR INFARCT  (CITED ON OR BEFORE 13-Sep-2023) PROLONGED QT ABNORMAL ECG WHEN COMPARED WITH ECG OF 01-Dec-2023  06:25, QT HAS LENGTHENED  XR Chest Portable Result Date: 12/01/2023 EXAM: XR CHEST PORTABLE DATE: 12/01/2023 5:58 AM ACCESSION: [id number] UN DICTATED: 12/01/2023 6:42 AM CLINICAL INDICATION: 33 years old Female with CHEST PAIN  TECHNIQUE: Portable chest radiograph COMPARISON: Multiple prior chest studies, the most recent from Sep 14, 2023 FINDINGS: HARDWARE: No indwelling support hardware is evident. HEART &  MEDIASTINUM:  The cardiac silhouette is similarly enlarged. PULMONARY/PLEURAL SPACE:  Both lungs are well expanded and aerated without overt edema or consolidation. There is no discernible accumulation of gas and fluid within the pleural spaces.  OTHER:  Unremarkable imaging of the cephalad abdomen   Similar enlargement of the cardiac silhouette without overt pulmonary edema or accumulation of pleural fluid   ______________________________________________________________________ Discharge Instructions:           Appointments which have been scheduled for you    Dec 09, 2023 10:00 AM (Arrive by 9:45 AM) RETURN CONTINUITY with [name] [name], DO [facility] PRIMARY CARE [address] AT [facility] REGION) [address] [place] [address] [phone]     Dec 11, 2023 9:00 AM (Arrive by 8:45 AM) NEW PULMONARY with Heinz JAYSON Canny, MD [facility] PULMONARY SPECIALTY CL [place] [facility] REGION) [address] [facility] 1 through 4 [place] [address] [phone]     Jan 31, 2024 1:00 PM (Arrive by 12:45 PM) NEW GENERAL PCP with Ileana JULIANNA Lecher, MD [facility] GI MEDICINE [place] [facility]) [address] [facility] 1 through 4 [place] [address] [phone]     Apr 09, 2024 1:00 PM RETURN OPHTHALMOLOGY with Landry Laray Castor, MD [facility] OPHTHALMOLOGY [address] [place] [facility] REGION) [address] [place] [address] [phone]  ______________________________________________________________________ Discharge Day Services: BP 140/65   Pulse 77   Temp 36.7 C (98.1 F)   Resp 18   Ht 160 cm (5' 2.99)   Wt 73.2 kg (161 lb 6 oz)   SpO2 99%   BMI 28.59 kg/m   Pt seen on the day of discharge and determined appropriate for discharge.  Condition at Discharge: fair  Length of Discharge: I spent greater than 30 mins in the discharge of this patient.

## 2023-12-05 ENCOUNTER — Encounter: Payer: Self-pay | Admitting: Obstetrics and Gynecology

## 2023-12-05 ENCOUNTER — Encounter: Admitting: Obstetrics and Gynecology

## 2023-12-06 NOTE — Progress Notes (Signed)
 Patient did not keep her GYN referral appointment for 12/05/2023.  Ashley Izell Raddle MD Attending Center for [employer] [facility])

## 2023-12-09 ENCOUNTER — Ambulatory Visit: Payer: Self-pay

## 2023-12-09 NOTE — Telephone Encounter (Signed)
 Triage was not completed - pt started stating the questions she gave were just given to get an appt: the answers might to true.  Pt disconnected call.   Copied from CRM [id number]. Topic: Clinical - Red Word Triage >> Dec 09, 2023  1:53 PM Fonda T wrote: [facility] that prompted transfer to Nurse Triage: Received call from patient, states having body pain all over, difficulty walking, swelling in legs, feet and ankles.   Previously scheduled for  new patient visit on 11/15/23, missed appointment. Answer Assessment - Initial Assessment Questions 1. ONSET: When did the muscle aches or body pains start?      Ongoing and worsening 2. LOCATION: What part of your body is hurting? (e.g., entire body, arms, legs)      Bilateral feet & legs, swell and are painful 3. SEVERITY: How bad is the pain? (Scale 1-10; or mild, moderate, severe)     9/10 4. CAUSE: What do you think is causing the pains?     na 5. FEVER: Do you have a fever? If Yes, ask: What is your temperature, how was it measured, and  when did it start?      na 6. OTHER SYMPTOMS: Do you have any other symptoms? (e.g., chest pain, cold or flu symptoms, rash, weakness, weight loss)     B/c of dialysis 7. PREGNANCY: Is there any chance you are pregnant? When was your last menstrual period?     na 8. TRAVEL: Have you traveled out of the country in the last month? (e.g., exposures, travel history)     N/a  Protocols used: Muscle Aches and Body Pain-A-AH

## 2023-12-13 ENCOUNTER — Emergency Department (HOSPITAL_COMMUNITY)
Admission: EM | Admit: 2023-12-13 | Discharge: 2023-12-13 | Disposition: A | Discharge status: Home / Self Care | Attending: Emergency Medicine | Admitting: Emergency Medicine

## 2023-12-13 ENCOUNTER — Emergency Department (HOSPITAL_COMMUNITY)

## 2023-12-13 DIAGNOSIS — R0602 Shortness of breath: Secondary | ICD-10-CM | POA: Diagnosis present

## 2023-12-13 DIAGNOSIS — Z992 Dependence on renal dialysis: Secondary | ICD-10-CM | POA: Diagnosis not present

## 2023-12-13 DIAGNOSIS — N186 End stage renal disease: Secondary | ICD-10-CM | POA: Insufficient documentation

## 2023-12-13 DIAGNOSIS — Z794 Long term (current) use of insulin: Secondary | ICD-10-CM | POA: Diagnosis not present

## 2023-12-13 LAB — CBC WITH DIFFERENTIAL/PLATELET
Abs Immature Granulocytes: 0.06 K/uL (ref 0.00–0.07)
Basophils Absolute: 0 K/uL (ref 0.0–0.1)
Basophils Relative: 0 %
Eosinophils Absolute: 0.5 K/uL (ref 0.0–0.5)
Eosinophils Relative: 5 %
HCT: 29 % — ABNORMAL LOW (ref 36.0–46.0)
Hemoglobin: 8.9 g/dL — ABNORMAL LOW (ref 12.0–15.0)
Immature Granulocytes: 1 %
Lymphocytes Relative: 23 %
Lymphs Abs: 2.4 K/uL (ref 0.7–4.0)
MCH: 27.6 pg (ref 26.0–34.0)
MCHC: 30.7 g/dL (ref 30.0–36.0)
MCV: 89.8 fL (ref 80.0–100.0)
Monocytes Absolute: 1.1 K/uL — ABNORMAL HIGH (ref 0.1–1.0)
Monocytes Relative: 10 %
Neutro Abs: 6.4 K/uL (ref 1.7–7.7)
Neutrophils Relative %: 61 %
Platelets: 280 K/uL (ref 150–400)
RBC: 3.23 MIL/uL — ABNORMAL LOW (ref 3.87–5.11)
RDW: 16.3 % — ABNORMAL HIGH (ref 11.5–15.5)
WBC: 10.4 K/uL (ref 4.0–10.5)
nRBC: 0 % (ref 0.0–0.2)

## 2023-12-13 LAB — COMPREHENSIVE METABOLIC PANEL WITH GFR
ALT: 41 U/L (ref 0–44)
AST: 44 U/L — ABNORMAL HIGH (ref 15–41)
Albumin: 2.5 g/dL — ABNORMAL LOW (ref 3.5–5.0)
Alkaline Phosphatase: 716 U/L — ABNORMAL HIGH (ref 38–126)
Anion gap: 13 (ref 5–15)
BUN: 31 mg/dL — ABNORMAL HIGH (ref 6–20)
CO2: 25 mmol/L (ref 22–32)
Calcium: 8 mg/dL — ABNORMAL LOW (ref 8.9–10.3)
Chloride: 102 mmol/L (ref 98–111)
Creatinine, Ser: 6.12 mg/dL — ABNORMAL HIGH (ref 0.44–1.00)
GFR, Estimated: 9 mL/min — ABNORMAL LOW (ref >60)
Glucose, Bld: 177 mg/dL — ABNORMAL HIGH (ref 70–99)
Potassium: 3.6 mmol/L (ref 3.5–5.1)
Sodium: 140 mmol/L (ref 135–145)
Total Bilirubin: 1.1 mg/dL (ref 0.0–1.2)
Total Protein: 5.3 g/dL — ABNORMAL LOW (ref 6.5–8.1)

## 2023-12-13 LAB — HCG, SERUM, QUALITATIVE: Preg, Serum: NEGATIVE

## 2023-12-13 MED ORDER — ACETAMINOPHEN 500 MG PO TABS
1000.0000 mg | ORAL_TABLET | Freq: Once | ORAL | Status: AC
  Administered 2023-12-13: 1000 mg via ORAL
  Filled 2023-12-13: qty 2

## 2023-12-13 MED ORDER — HYDROMORPHONE HCL 2 MG PO TABS
4.0000 mg | ORAL_TABLET | Freq: Once | ORAL | Status: AC
  Administered 2023-12-13: 4 mg via ORAL
  Filled 2023-12-13: qty 2

## 2023-12-13 NOTE — ED Triage Notes (Signed)
 BIB EMS from home where she reports she only had half of the normal amount removed at dialysis today and reports she needs the rest taken off. Has some SHOB. Vitals normal with EMS. Got 2L off when normally has 4L.

## 2023-12-13 NOTE — Discharge Instructions (Signed)
 Call your dialysis center tomorrow for urgent outpatient dialysis.  If you develop shortness of breath, chest pain, new or worsening abdominal pain or swelling, or any other new/concerning symptoms then return to the ER.

## 2023-12-13 NOTE — ED Provider Notes (Signed)
 [facility] EMERGENCY DEPARTMENT AT [facility] Provider Note   CSN: [id number] Arrival date & time: 12/13/23  1934     Patient presents with: Shortness of Breath   Ashley Freeman is a 33 y.o. female.   HPI 33 year old female presents feeling bad after only getting half of her dialysis today.  She reports that she went on Wednesday and then only got a half session today because she was late.  States she still has abdominal distention and pain which always happens when she has extra fluid.  She is has also developed a headache since leaving dialysis that has progressively worsened and is frontal.  She did vomit twice.  She states she has never really had a headache like this.  There was no sudden onset of it.  Developed over a couple hours.  No chest pain but she does feel short of breath. The abdominal pain is chronic per her. No fevers, vision changes, weakness/numbness.  Prior to Admission medications   Medication Sig Start Date End Date Taking? Authorizing Provider  albuterol  (VENTOLIN  HFA) 108 (90 Base) MCG/ACT inhaler Inhale 2 puffs into the lungs every 4 (four) hours as needed for wheezing or shortness of breath. 11/24/22   [provider]  atorvastatin  (LIPITOR ) 80 MG tablet Take 1 tablet (80 mg total) by mouth daily. Patient not taking: Reported on 11/26/2023 10/15/23   Jonel Lonni SQUIBB, MD  bumetanide  (BUMEX ) 2 MG tablet Take 2 mg by mouth daily. Patient not taking: Reported on 11/26/2023    [provider]  calcitRIOL  (ROCALTROL ) 0.25 MCG capsule Take 5 capsules (1.25 mcg total) by mouth every Tuesday, Thursday, and Saturday at 6 PM. Patient not taking: Reported on 11/26/2023 07/09/23   Cindy Garnette POUR, MD  carvedilol  (COREG ) 12.5 MG tablet Take 1 tablet (12.5 mg total) by mouth 2 (two) times daily with a meal. Patient not taking: Reported on 11/26/2023 10/29/23   Regalado, Owen A, MD  Continuous Glucose Sensor (DEXCOM G7 SENSOR) MISC  Change sensors every 10 days Patient taking differently: Inject 1 Device into the skin See admin instructions. Place 1 new sensor into the skin every 10 days 09/07/22   Shamleffer, Ibtehal Jaralla, MD  cyclobenzaprine  (FLEXERIL ) 5 MG tablet Take 1 tablet (5 mg total) by mouth 3 (three) times daily as needed for muscle spasms. 08/31/23   Ghimire, Donalda HERO, MD  dicyclomine  (BENTYL ) 20 MG tablet Take 20 mg by mouth 2 (two) times daily as needed for spasms.    [provider]  DULoxetine  (CYMBALTA ) 20 MG capsule Take 20 mg by mouth 2 (two) times daily. Patient not taking: Reported on 11/26/2023 07/29/23   [provider]  famotidine  (PEPCID ) 20 MG tablet Take 20 mg by mouth daily before breakfast. 07/29/23   [provider]  HYDROmorphone  (DILAUDID ) 2 MG tablet Take 2 mg by mouth every 4 (four) hours as needed for severe pain (pain score 7-10).    [provider]  hydrOXYzine  (ATARAX ) 25 MG tablet Take 25 mg by mouth 3 (three) times daily as needed for anxiety, itching, nausea or vomiting.    [provider]  insulin  aspart (NOVOLOG ) 100 UNIT/ML injection Inject 5 Units into the skin 3 (three) times daily with meals.    [provider]  lamoTRIgine  (LAMICTAL ) 200 MG tablet Take 1 tablet (200 mg total) by mouth daily. Patient not taking: Reported on 11/26/2023 10/29/23   Regalado, Belkys A, MD  LANTUS  SOLOSTAR 100 UNIT/ML Solostar Pen Inject  10 Units into the skin in the morning.    [provider]  losartan  (COZAAR ) 100 MG tablet Take 150 mg by mouth daily. 03/25/23   [provider]  metoCLOPramide  (REGLAN ) 10 MG tablet Take 1 tablet (10 mg total) by mouth every 8 (eight) hours as needed for nausea. 10/14/23   Danford, Lonni SQUIBB, MD  OLANZapine  zydis (ZYPREXA ) 5 MG disintegrating tablet Take 5 mg by mouth at bedtime. Patient not taking: Reported on 11/26/2023 09/23/23 12/22/23  [provider]  ondansetron  (ZOFRAN -ODT) 4 MG  disintegrating tablet Take 1 tablet (4 mg total) by mouth every 8 (eight) hours as needed for nausea or vomiting. 06/23/23   Davis, Jonathon H, MD  Pancrelipase , Lip-Prot-Amyl, 3000-9500 units CPEP Take 3,000 units of lipase by mouth 3 (three) times daily. Patient not taking: Reported on 11/26/2023 09/23/23 12/22/23  [provider]  sevelamer  carbonate (RENVELA ) 800 MG tablet Take 2,400 mg by mouth 3 (three) times daily with meals. Patient not taking: Reported on 11/26/2023    [provider]  sodium bicarbonate  650 MG tablet Take 1,300 mg by mouth 2 (two) times daily. 07/29/23   [provider]  SUMAtriptan  (IMITREX ) 50 MG tablet Take 50 mg by mouth every 2 (two) hours as needed for migraine or headache. 06/20/22   [provider]    Allergies: Cephalexin , Penicillins, Benadryl  [diphenhydramine ], Dilaudid  [hydromorphone ], Doxycycline , Methotrexate derivatives, and Oxycodone     Review of Systems  Respiratory:  Positive for shortness of breath.   Cardiovascular:  Negative for chest pain.  Gastrointestinal:  Positive for abdominal distention, abdominal pain and vomiting. Negative for nausea.  Musculoskeletal:  Negative for neck stiffness.  Neurological:  Positive for headaches.    Updated Vital Signs BP (!) 153/99   Pulse 96   Temp 98.4 F (36.9 C)   Resp 20   SpO2 99%   Physical Exam Vitals and nursing note reviewed.  Constitutional:      Appearance: She is well-developed.  HENT:     Head: Normocephalic and atraumatic.  Eyes:     Extraocular Movements: Extraocular movements intact.  Cardiovascular:     Rate and Rhythm: Normal rate and regular rhythm.     Heart sounds: Normal heart sounds.  Pulmonary:     Effort: Pulmonary effort is normal.     Breath sounds: Normal breath sounds.  Abdominal:     General: There is distension.     Palpations: Abdomen is soft.     Tenderness: There is abdominal tenderness (generalized).  Skin:    General: Skin  is warm and dry.  Neurological:     Mental Status: She is alert.     Comments: CN 3-12 grossly intact. 5/5 strength in all 4 extremities. Grossly normal sensation. Normal finger to nose.      (all labs ordered are listed, but only abnormal results are displayed) Labs Reviewed  COMPREHENSIVE METABOLIC PANEL WITH GFR - Abnormal; Notable for the following components:      Result Value   Glucose, Bld 177 (*)    BUN 31 (*)    Creatinine, Ser 6.12 (*)    Calcium  8.0 (*)    Total Protein 5.3 (*)    Albumin  2.5 (*)    AST 44 (*)    Alkaline Phosphatase 716 (*)    GFR, Estimated 9 (*)    All other components within normal limits  CBC WITH DIFFERENTIAL/PLATELET - Abnormal; Notable for the following components:   RBC 3.23 (*)  Hemoglobin 8.9 (*)    HCT 29.0 (*)    RDW 16.3 (*)    Monocytes Absolute 1.1 (*)    All other components within normal limits  HCG, SERUM, QUALITATIVE    EKG: EKG Interpretation Date/Time:  Friday December 13 2023 19:44:39 EDT Ventricular Rate:  92 PR Interval:  129 QRS Duration:  74 QT Interval:  451 QTC Calculation: 558 R Axis:   64  Text Interpretation: Sinus rhythm Borderline T abnormalities, lateral leads Prolonged QT interval Baseline wander in lead(s) III V5 similar to July 2025 Confirmed by Freddi Hamilton [phone]) on 12/13/2023 7:55:58 PM  Radiology: DG Chest 2 View Result Date: 12/13/2023 CLINICAL DATA:  Shortness of breath, missed dialysis. EXAM: CHEST - 2 VIEW COMPARISON:  None Available. FINDINGS: The cardiac silhouette is mildly enlarged and unchanged in size. Low lung volumes are noted with subsequent crowding of the bronchovascular lung markings. No acute infiltrate, pleural effusion or pneumothorax is identified. A radiopaque vascular stent is seen within the medial aspect of the proximal right upper extremity. The visualized skeletal structures are unremarkable. IMPRESSION: Low lung volumes without acute or active cardiopulmonary disease.  Electronically Signed   By: Suzen Dials M.D.   On: 12/13/2023 21:26     Procedures   Medications Ordered in the ED  acetaminophen  (TYLENOL ) tablet 1,000 mg (1,000 mg Oral Given 12/13/23 2013)  HYDROmorphone  (DILAUDID ) tablet 4 mg (4 mg Oral Given 12/13/23 2125)                                    Medical Decision Making Amount and/or Complexity of Data Reviewed External Data Reviewed: notes. Labs: ordered.    Details: Normal potassium, stable anemia Radiology: ordered and independent interpretation performed.    Details: No CHF  Risk OTC drugs. Prescription drug management.   Patient presents with multiple complaints.  From a headache perspective she has had a gradually worsening headache but has an unremarkable neuroexam.  My suspicion of acute CNS emergency such as head bleed, stroke, subarachnoid hemorrhage, is all pretty low and I do not think CT head needed.    From a missed dialysis standpoint, labs are baseline are reassuring and there is no current indication for an emergent need for dialysis.  I think she will need to get dialysis tomorrow with her dialysis center, needs to call them in the morning.  Unfortunately she has a known history of noncompliance with dialysis.  From an abdominal pain perspective she has distention but is well-appearing, resting comfortably, in no distress.  She has a known history of distention and previous ascites.  My suspicion of SBP is pretty low with no fever and a normal WBC.  This appears to be a recurrent issue for her.  She is requesting IV narcotics though I think this is more of a chronic issue.  Will give some p.o. Dilaudid  which she has been given in the past and have her follow-up with dialysis.  Will give return precautions but appears stable for discharge.      Final diagnoses:  ESRD (end stage renal disease) on dialysis [facility])    ED Discharge Orders     None          Freddi Hamilton, MD 12/13/23 2311

## 2023-12-13 NOTE — ED Notes (Signed)
 CCMD called.

## 2023-12-13 NOTE — ED Notes (Signed)
 Pt wanting to leave AMA due to not having more pain medication. Reports she can go get dialysis in the AM. Provider aware and to come to bedside to chat with her.

## 2023-12-19 ENCOUNTER — Emergency Department (HOSPITAL_COMMUNITY)

## 2023-12-19 ENCOUNTER — Encounter (HOSPITAL_COMMUNITY): Payer: Self-pay | Admitting: Family Medicine

## 2023-12-19 ENCOUNTER — Inpatient Hospital Stay (HOSPITAL_COMMUNITY)
Admission: EM | Admit: 2023-12-19 | Discharge: 2023-12-20 | DRG: 291 | Disposition: A | Discharge status: Home / Self Care | Attending: Internal Medicine | Admitting: Internal Medicine

## 2023-12-19 DIAGNOSIS — I132 Hypertensive heart and chronic kidney disease with heart failure and with stage 5 chronic kidney disease, or end stage renal disease: Principal | ICD-10-CM | POA: Diagnosis present

## 2023-12-19 DIAGNOSIS — Z881 Allergy status to other antibiotic agents status: Secondary | ICD-10-CM

## 2023-12-19 DIAGNOSIS — Z79899 Other long term (current) drug therapy: Secondary | ICD-10-CM

## 2023-12-19 DIAGNOSIS — M7989 Other specified soft tissue disorders: Secondary | ICD-10-CM | POA: Diagnosis not present

## 2023-12-19 DIAGNOSIS — I10 Essential (primary) hypertension: Secondary | ICD-10-CM | POA: Diagnosis present

## 2023-12-19 DIAGNOSIS — R188 Other ascites: Secondary | ICD-10-CM | POA: Diagnosis present

## 2023-12-19 DIAGNOSIS — I428 Other cardiomyopathies: Secondary | ICD-10-CM | POA: Diagnosis present

## 2023-12-19 DIAGNOSIS — I5042 Chronic combined systolic (congestive) and diastolic (congestive) heart failure: Secondary | ICD-10-CM | POA: Diagnosis present

## 2023-12-19 DIAGNOSIS — Z794 Long term (current) use of insulin: Secondary | ICD-10-CM | POA: Diagnosis not present

## 2023-12-19 DIAGNOSIS — Z992 Dependence on renal dialysis: Secondary | ICD-10-CM

## 2023-12-19 DIAGNOSIS — F319 Bipolar disorder, unspecified: Secondary | ICD-10-CM | POA: Diagnosis present

## 2023-12-19 DIAGNOSIS — E8809 Other disorders of plasma-protein metabolism, not elsewhere classified: Secondary | ICD-10-CM | POA: Diagnosis present

## 2023-12-19 DIAGNOSIS — Z885 Allergy status to narcotic agent status: Secondary | ICD-10-CM

## 2023-12-19 DIAGNOSIS — E8770 Fluid overload, unspecified: Secondary | ICD-10-CM | POA: Diagnosis not present

## 2023-12-19 DIAGNOSIS — Z91199 Patient's noncompliance with other medical treatment and regimen due to unspecified reason: Secondary | ICD-10-CM

## 2023-12-19 DIAGNOSIS — E1065 Type 1 diabetes mellitus with hyperglycemia: Secondary | ICD-10-CM | POA: Diagnosis present

## 2023-12-19 DIAGNOSIS — N186 End stage renal disease: Secondary | ICD-10-CM | POA: Diagnosis present

## 2023-12-19 DIAGNOSIS — E1022 Type 1 diabetes mellitus with diabetic chronic kidney disease: Secondary | ICD-10-CM | POA: Diagnosis present

## 2023-12-19 DIAGNOSIS — Z880 Allergy status to penicillin: Secondary | ICD-10-CM | POA: Diagnosis not present

## 2023-12-19 DIAGNOSIS — N289 Disorder of kidney and ureter, unspecified: Secondary | ICD-10-CM | POA: Diagnosis not present

## 2023-12-19 DIAGNOSIS — E785 Hyperlipidemia, unspecified: Secondary | ICD-10-CM | POA: Diagnosis present

## 2023-12-19 DIAGNOSIS — D631 Anemia in chronic kidney disease: Secondary | ICD-10-CM | POA: Diagnosis present

## 2023-12-19 DIAGNOSIS — Z825 Family history of asthma and other chronic lower respiratory diseases: Secondary | ICD-10-CM

## 2023-12-19 DIAGNOSIS — R030 Elevated blood-pressure reading, without diagnosis of hypertension: Secondary | ICD-10-CM

## 2023-12-19 DIAGNOSIS — I5043 Acute on chronic combined systolic (congestive) and diastolic (congestive) heart failure: Secondary | ICD-10-CM | POA: Diagnosis present

## 2023-12-19 DIAGNOSIS — M79601 Pain in right arm: Secondary | ICD-10-CM

## 2023-12-19 DIAGNOSIS — E1159 Type 2 diabetes mellitus with other circulatory complications: Secondary | ICD-10-CM | POA: Diagnosis present

## 2023-12-19 DIAGNOSIS — Z833 Family history of diabetes mellitus: Secondary | ICD-10-CM | POA: Diagnosis not present

## 2023-12-19 DIAGNOSIS — Z888 Allergy status to other drugs, medicaments and biological substances status: Secondary | ICD-10-CM | POA: Diagnosis not present

## 2023-12-19 DIAGNOSIS — R739 Hyperglycemia, unspecified: Secondary | ICD-10-CM

## 2023-12-19 LAB — COMPREHENSIVE METABOLIC PANEL WITH GFR
ALT: 36 U/L (ref 0–44)
AST: 34 U/L (ref 15–41)
Albumin: 2.6 g/dL — ABNORMAL LOW (ref 3.5–5.0)
Alkaline Phosphatase: 715 U/L — ABNORMAL HIGH (ref 38–126)
Anion gap: 16 — ABNORMAL HIGH (ref 5–15)
BUN: 45 mg/dL — ABNORMAL HIGH (ref 6–20)
CO2: 17 mmol/L — ABNORMAL LOW (ref 22–32)
Calcium: 8.4 mg/dL — ABNORMAL LOW (ref 8.9–10.3)
Chloride: 102 mmol/L (ref 98–111)
Creatinine, Ser: 8.45 mg/dL — ABNORMAL HIGH (ref 0.44–1.00)
GFR, Estimated: 6 mL/min — ABNORMAL LOW (ref >60)
Glucose, Bld: 424 mg/dL — ABNORMAL HIGH (ref 70–99)
Potassium: 4.4 mmol/L (ref 3.5–5.1)
Sodium: 135 mmol/L (ref 135–145)
Total Bilirubin: 1.2 mg/dL (ref 0.0–1.2)
Total Protein: 5.5 g/dL — ABNORMAL LOW (ref 6.5–8.1)

## 2023-12-19 LAB — CBC WITH DIFFERENTIAL/PLATELET
Abs Immature Granulocytes: 0.05 K/uL (ref 0.00–0.07)
Basophils Absolute: 0.1 K/uL (ref 0.0–0.1)
Basophils Relative: 1 %
Eosinophils Absolute: 0.5 K/uL (ref 0.0–0.5)
Eosinophils Relative: 7 %
HCT: 36.5 % (ref 36.0–46.0)
Hemoglobin: 11.1 g/dL — ABNORMAL LOW (ref 12.0–15.0)
Immature Granulocytes: 1 %
Lymphocytes Relative: 27 %
Lymphs Abs: 2.1 K/uL (ref 0.7–4.0)
MCH: 27.3 pg (ref 26.0–34.0)
MCHC: 30.4 g/dL (ref 30.0–36.0)
MCV: 89.7 fL (ref 80.0–100.0)
Monocytes Absolute: 0.6 K/uL (ref 0.1–1.0)
Monocytes Relative: 7 %
Neutro Abs: 4.4 K/uL (ref 1.7–7.7)
Neutrophils Relative %: 57 %
Platelets: 284 K/uL (ref 150–400)
RBC: 4.07 MIL/uL (ref 3.87–5.11)
RDW: 16.8 % — ABNORMAL HIGH (ref 11.5–15.5)
WBC: 7.7 K/uL (ref 4.0–10.5)
nRBC: 0 % (ref 0.0–0.2)

## 2023-12-19 LAB — BRAIN NATRIURETIC PEPTIDE: B Natriuretic Peptide: >4500 pg/mL — ABNORMAL HIGH (ref 0.0–100.0)

## 2023-12-19 LAB — TROPONIN I (HIGH SENSITIVITY): Troponin I (High Sensitivity): 16 ng/L (ref <18)

## 2023-12-19 MED ORDER — SENNA 8.6 MG PO TABS: 1.0000 | ORAL_TABLET | Freq: Every day | ORAL | Status: DC | PRN

## 2023-12-19 MED ORDER — PANCRELIPASE (LIP-PROT-AMYL) 12000-38000 UNITS PO CPEP
12000.0000 [IU] | ORAL_CAPSULE | Freq: Three times a day (TID) | ORAL | Status: DC
  Administered 2023-12-20 (×2): 12000 [IU] via ORAL
  Filled 2023-12-19 (×4): qty 1

## 2023-12-19 MED ORDER — FAMOTIDINE 20 MG PO TABS
20.0000 mg | ORAL_TABLET | Freq: Every day | ORAL | Status: DC
  Administered 2023-12-20: 20 mg via ORAL
  Filled 2023-12-19: qty 1

## 2023-12-19 MED ORDER — INSULIN ASPART 100 UNIT/ML IJ SOLN: 0.0000 [IU] | Freq: Three times a day (TID) | INTRAMUSCULAR | Status: DC

## 2023-12-19 MED ORDER — CARVEDILOL 12.5 MG PO TABS
12.5000 mg | ORAL_TABLET | Freq: Two times a day (BID) | ORAL | Status: DC
  Administered 2023-12-20: 12.5 mg via ORAL
  Filled 2023-12-19 (×2): qty 1

## 2023-12-19 MED ORDER — SODIUM BICARBONATE 650 MG PO TABS
1300.0000 mg | ORAL_TABLET | Freq: Two times a day (BID) | ORAL | Status: DC
Start: 2023-12-19 — End: 2023-12-20
  Administered 2023-12-20: 1300 mg via ORAL
  Filled 2023-12-19: qty 2

## 2023-12-19 MED ORDER — ACETAMINOPHEN 650 MG RE SUPP: 650.0000 mg | Freq: Four times a day (QID) | RECTAL | Status: DC | PRN

## 2023-12-19 MED ORDER — CARVEDILOL 12.5 MG PO TABS: 12.5000 mg | ORAL_TABLET | Freq: Two times a day (BID) | ORAL | Status: DC

## 2023-12-19 MED ORDER — INSULIN GLARGINE-YFGN 100 UNIT/ML ~~LOC~~ SOLN
10.0000 [IU] | Freq: Every day | SUBCUTANEOUS | Status: DC
  Administered 2023-12-20: 10 [IU] via SUBCUTANEOUS
  Filled 2023-12-19 (×3): qty 0.1

## 2023-12-19 MED ORDER — SODIUM CHLORIDE 0.9% FLUSH
3.0000 mL | Freq: Two times a day (BID) | INTRAVENOUS | Status: DC
  Administered 2023-12-20 (×2): 3 mL via INTRAVENOUS

## 2023-12-19 MED ORDER — OLANZAPINE 5 MG PO TBDP
5.0000 mg | ORAL_TABLET | Freq: Every day | ORAL | Status: DC
  Administered 2023-12-20: 5 mg via ORAL
  Filled 2023-12-19 (×2): qty 1

## 2023-12-19 MED ORDER — INSULIN ASPART 100 UNIT/ML IJ SOLN
12.0000 [IU] | Freq: Once | INTRAMUSCULAR | Status: AC
  Administered 2023-12-20: 12 [IU] via SUBCUTANEOUS

## 2023-12-19 MED ORDER — HYDROMORPHONE HCL 2 MG PO TABS
1.0000 mg | ORAL_TABLET | ORAL | Status: DC | PRN
  Administered 2023-12-20: 1 mg via ORAL
  Filled 2023-12-19: qty 1

## 2023-12-19 MED ORDER — INSULIN ASPART 100 UNIT/ML IJ SOLN
0.0000 [IU] | Freq: Every day | INTRAMUSCULAR | Status: DC
  Administered 2023-12-20: 5 [IU] via SUBCUTANEOUS

## 2023-12-19 MED ORDER — TRIMETHOBENZAMIDE HCL 100 MG/ML IM SOLN: 200.0000 mg | Freq: Four times a day (QID) | INTRAMUSCULAR | Status: DC | PRN

## 2023-12-19 MED ORDER — HYDROMORPHONE HCL 1 MG/ML IJ SOLN
1.0000 mg | Freq: Once | INTRAMUSCULAR | Status: AC
  Administered 2023-12-19: 1 mg via INTRAVENOUS
  Filled 2023-12-19: qty 1

## 2023-12-19 MED ORDER — CHLORHEXIDINE GLUCONATE CLOTH 2 % EX PADS
6.0000 | MEDICATED_PAD | Freq: Every day | CUTANEOUS | Status: DC
  Administered 2023-12-20: 6 via TOPICAL

## 2023-12-19 MED ORDER — LAMOTRIGINE 100 MG PO TABS
200.0000 mg | ORAL_TABLET | Freq: Every day | ORAL | Status: DC
  Administered 2023-12-20: 200 mg via ORAL
  Filled 2023-12-19: qty 2

## 2023-12-19 MED ORDER — ACETAMINOPHEN 325 MG PO TABS
650.0000 mg | ORAL_TABLET | Freq: Four times a day (QID) | ORAL | Status: DC | PRN
Start: 2023-12-19 — End: 2023-12-20

## 2023-12-19 MED ORDER — CALCITRIOL 0.5 MCG PO CAPS: 1.2500 ug | ORAL_CAPSULE | ORAL | Status: DC

## 2023-12-19 MED ORDER — HYDRALAZINE HCL 50 MG PO TABS: 50.0000 mg | ORAL_TABLET | Freq: Four times a day (QID) | ORAL | Status: DC | PRN

## 2023-12-19 MED ORDER — HYDROXYZINE HCL 25 MG PO TABS: 25.0000 mg | ORAL_TABLET | Freq: Three times a day (TID) | ORAL | Status: DC | PRN

## 2023-12-19 MED ORDER — DIPHENHYDRAMINE HCL 50 MG/ML IJ SOLN
25.0000 mg | Freq: Once | INTRAMUSCULAR | Status: DC
  Filled 2023-12-19: qty 1

## 2023-12-19 MED ORDER — HEPARIN SODIUM (PORCINE) 5000 UNIT/ML IJ SOLN
5000.0000 [IU] | Freq: Three times a day (TID) | INTRAMUSCULAR | Status: DC
  Administered 2023-12-20 (×3): 5000 [IU] via SUBCUTANEOUS
  Filled 2023-12-19 (×3): qty 1

## 2023-12-19 NOTE — H&P (Signed)
 History and Physical    DONALDA Freeman FMW:[id number] DOB: 1991/04/02 DOA: 12/19/2023  PCP: Keven Crumbly Pap, MD   Patient coming from: Home   Chief Complaint: RUE pain and swelling, abdominal pain and distension   HPI: Ashley Freeman is a pleasant 33 y.o. female with medical history significant for hypertension, insulin -dependent diabetes mellitus, bipolar disorder, chronic combined systolic and diastolic CHF, and ESRD on hemodialysis who presents with multiple complaints including RUE pain and swelling and abdominal pain and distention.  Patient explains that she has had difficulty arranging childcare and missed half of her dialysis session yesterday for this reason.  She has noted development of right upper extremity pain and swelling over the past 1 to 2 days and states that her dialysis access in the RUE seem to be functioning appropriately yesterday.  She also complains of insidiously worsening abdominal pain and distention, noting that she required a paracentesis roughly a month ago for this.  She denies any fevers, chills, melena, or hematochezia.  ED Course: Upon arrival to the ED, patient is found to be afebrile and saturating well on room air with normal HR and elevated BP.  Labs are most notable for bicarbonate 17, BUN 45, glucose 124, albumin  2.6, alkaline phosphatase 715, normal WBC, normal troponin, and BNP >4500.  Chest x-ray demonstrates chronic cardiomegaly with mild pulmonary edema and trace pleural effusions.  Nephrology was consulted by the ED physician and the patient was treated with subcutaneous NovoLog , Benadryl , and Dilaudid .  Review of Systems:  All other systems reviewed and apart from HPI, are negative.  Past Medical History:  Diagnosis Date   Abscess, gluteal, right 08/24/2013   Anemia 02/19/2012   Bartholin's gland abscess 09/19/2013   Bipolar disorder (HCC)    BV (bacterial vaginosis) 11/24/2015   Depression    Diabetes  mellitus type I (HCC) 2001   Diagnosed at age 68 ; Type I   Diarrhea 05/30/2016   DKA (diabetic ketoacidoses) 08/19/2013   Also in 2018   ESRD (end stage renal disease) (HCC)    Gonorrhea 08/2011   Treated in 09/2011   HFrEF (heart failure with reduced ejection fraction) (HCC)    a. 2022 Echo: EF 40%; b. 10/2021 Echo: EF 55%; b. 07/2022 MV: No ischemia. EF 31%; c. 08/2022 Echo: EF 35%, mildly dil RV, sev TR.   History of trichomoniasis 05/31/2016   Hyperlipidemia 03/28/2016   Hypertension    NICM (nonischemic cardiomyopathy) (HCC)    Sepsis (HCC) 09/19/2013    Past Surgical History:  Procedure Laterality Date   A/V FISTULAGRAM Right 06/17/2023   Procedure: A/V Fistulagram;  Surgeon: Marea Selinda GORMAN, MD;  Location: [facility] INVASIVE CV LAB;  Service: Cardiovascular;  Laterality: Right;   A/V SHUNT INTERVENTION N/A 09/25/2023   Procedure: A/V SHUNT INTERVENTION;  Surgeon: Pearline Norman GORMAN, MD;  Location: [facility];  Service: Cardiovascular;  Laterality: N/A;   AV FISTULA PLACEMENT Right 07/06/2022   Procedure: ARTERIOVENOUS GRAFT CREATION;  Surgeon: Gretta Lonni PARAS, MD;  Location: [facility];  Service: Vascular;  Laterality: Right;   CESAREAN SECTION N/A 10/05/2019   Procedure: CESAREAN SECTION;  Surgeon: Izell Harari, MD;  Location: [facility] LD ORS;  Service: Obstetrics;  Laterality: N/A;   CHOLECYSTECTOMY N/A 07/02/2023   Procedure: LAPAROSCOPIC CHOLECYSTECTOMY;  Surgeon: Ebbie Cough, MD;  Location: [facility] OR;  Service: General;  Laterality: N/A;   INCISION AND DRAINAGE ABSCESS Left 09/28/2019   Procedure: INCISION AND DRAINAGE VULVAR ABCESS;  Surgeon: Edsel Norleen GAILS, MD;  Location: [facility] OR;  Service: Gynecology;  Laterality: Left;   INCISION AND DRAINAGE PERIRECTAL ABSCESS Right 08/18/2013   Procedure: IRRIGATION AND DEBRIDEMENT GLUTEAL ABSCESS;  Surgeon: Lynda Leos, MD;  Location: [facility] OR;  Service: General;  Laterality: Right;   INCISION AND DRAINAGE PERIRECTAL ABSCESS Right 09/19/2013    Procedure: IRRIGATION AND DEBRIDEMENT RIGHT GLUTEAL AND LABIAL ABSCESSES;  Surgeon: Lynda Leos, MD;  Location: [facility] OR;  Service: General;  Laterality: Right;   INCISION AND DRAINAGE PERIRECTAL ABSCESS Right 09/24/2013   Procedure: IRRIGATION AND DEBRIDEMENT PERIRECTAL ABSCESS;  Surgeon: Lynwood MALVA Pina, MD;  Location: [facility] OR;  Service: General;  Laterality: Right;   IR PARACENTESIS  08/28/2023   IR PARACENTESIS  11/04/2023    Social History:   reports that she has never smoked. She has never been exposed to tobacco smoke. She has never used smokeless tobacco. She reports that she does not currently use alcohol. She reports that she does not use drugs.  Allergies  Allergen Reactions   Cephalexin  Anaphylaxis and Other (See Comments)    Has gotten ceftriaxone  in the past    Penicillins Hives and Rash   Benadryl  [Diphenhydramine ] Itching   Dilaudid  [Hydromorphone ] Itching   Doxycycline  Itching   Methotrexate Derivatives Rash   Oxycodone  Itching    Family History  Problem Relation Age of Onset   Asthma Mother    Carpal tunnel syndrome Mother    Gout Father    Diabetes Paternal Grandmother    Anesthesia problems Neg Hx      Prior to Admission medications   Medication Sig Start Date End Date Taking? Authorizing Provider  albuterol  (VENTOLIN  HFA) 108 (90 Base) MCG/ACT inhaler Inhale 2 puffs into the lungs every 4 (four) hours as needed for wheezing or shortness of breath. 11/24/22   [provider]  atorvastatin  (LIPITOR ) 80 MG tablet Take 1 tablet (80 mg total) by mouth daily. Patient not taking: Reported on 11/26/2023 10/15/23   Jonel Lonni SQUIBB, MD  bumetanide  (BUMEX ) 2 MG tablet Take 2 mg by mouth daily. Patient not taking: Reported on 11/26/2023    [provider]  calcitRIOL  (ROCALTROL ) 0.25 MCG capsule Take 5 capsules (1.25 mcg total) by mouth every Tuesday, Thursday, and Saturday at 6 PM. Patient not taking: Reported on 11/26/2023 07/09/23   Cindy Garnette POUR,  MD  carvedilol  (COREG ) 12.5 MG tablet Take 1 tablet (12.5 mg total) by mouth 2 (two) times daily with a meal. Patient not taking: Reported on 11/26/2023 10/29/23   Regalado, Owen A, MD  Continuous Glucose Sensor (DEXCOM G7 SENSOR) MISC Change sensors every 10 days Patient taking differently: Inject 1 Device into the skin See admin instructions. Place 1 new sensor into the skin every 10 days 09/07/22   Shamleffer, Ibtehal Jaralla, MD  cyclobenzaprine  (FLEXERIL ) 5 MG tablet Take 1 tablet (5 mg total) by mouth 3 (three) times daily as needed for muscle spasms. 08/31/23   Ghimire, Donalda HERO, MD  dicyclomine  (BENTYL ) 20 MG tablet Take 20 mg by mouth 2 (two) times daily as needed for spasms.    [provider]  DULoxetine  (CYMBALTA ) 20 MG capsule Take 20 mg by mouth 2 (two) times daily. Patient not taking: Reported on 11/26/2023 07/29/23   [provider]  famotidine  (PEPCID ) 20 MG tablet Take 20 mg by mouth daily before breakfast. 07/29/23   [provider]  HYDROmorphone  (DILAUDID ) 2 MG tablet Take 2 mg by mouth every 4 (four) hours as needed for severe pain (pain score 7-10).  [provider]  hydrOXYzine  (ATARAX ) 25 MG tablet Take 25 mg by mouth 3 (three) times daily as needed for anxiety, itching, nausea or vomiting.    [provider]  insulin  aspart (NOVOLOG ) 100 UNIT/ML injection Inject 5 Units into the skin 3 (three) times daily with meals.    [provider]  lamoTRIgine  (LAMICTAL ) 200 MG tablet Take 1 tablet (200 mg total) by mouth daily. Patient not taking: Reported on 11/26/2023 10/29/23   Regalado, Owen A, MD  LANTUS  SOLOSTAR 100 UNIT/ML Solostar Pen Inject 10 Units into the skin in the morning.    [provider]  losartan  (COZAAR ) 100 MG tablet Take 150 mg by mouth daily. 03/25/23   [provider]  metoCLOPramide  (REGLAN ) 10 MG tablet Take 1 tablet (10 mg total) by mouth every 8 (eight) hours as needed for nausea. 10/14/23    Danford, Lonni SQUIBB, MD  OLANZapine  zydis (ZYPREXA ) 5 MG disintegrating tablet Take 5 mg by mouth at bedtime. Patient not taking: Reported on 11/26/2023 09/23/23 12/22/23  [provider]  ondansetron  (ZOFRAN -ODT) 4 MG disintegrating tablet Take 1 tablet (4 mg total) by mouth every 8 (eight) hours as needed for nausea or vomiting. 06/23/23   Davis, Jonathon H, MD  Pancrelipase , Lip-Prot-Amyl, 3000-9500 units CPEP Take 3,000 units of lipase by mouth 3 (three) times daily. Patient not taking: Reported on 11/26/2023 09/23/23 12/22/23  [provider]  sevelamer  carbonate (RENVELA ) 800 MG tablet Take 2,400 mg by mouth 3 (three) times daily with meals. Patient not taking: Reported on 11/26/2023    [provider]  sodium bicarbonate  650 MG tablet Take 1,300 mg by mouth 2 (two) times daily. 07/29/23   [provider]  SUMAtriptan  (IMITREX ) 50 MG tablet Take 50 mg by mouth every 2 (two) hours as needed for migraine or headache. 06/20/22   [provider]    Physical Exam: Vitals:   12/19/23 2021  BP: (!) 169/107  Pulse: 97  Resp: 20  Temp: 97.7 F (36.5 C)  TempSrc: Oral  SpO2: 100%    Constitutional: NAD, no pallor or diaphoresis   Eyes: PERTLA, lids and conjunctivae normal ENMT: Mucous membranes are moist. Posterior pharynx clear of any exudate or lesions.   Neck: supple, no masses  Respiratory: Speaking in full sentences. No wheezing. No accessory muscle use.  Cardiovascular: S1 & S2 heard, regular rate and rhythm. Trace lower leg edema.  Abdomen: Soft, distended with fluid wave, generalized tenderness. Bowel sounds active.  Musculoskeletal: no clubbing / cyanosis. No joint deformity upper and lower extremities.   Skin: no significant rashes, lesions, ulcers. Warm, dry, well-perfused. Neurologic: CN 2-12 grossly intact. Moving all extremities. Alert and oriented.  Psychiatric: Calm. Cooperative.    Labs and Imaging on Admission: I have personally  reviewed following labs and imaging studies  CBC: Recent Labs  Lab 12/13/23 2014 12/19/23 2216  WBC 10.4 7.7  NEUTROABS 6.4 4.4  HGB 8.9* 11.1*  HCT 29.0* 36.5  MCV 89.8 89.7  PLT 280 284   Basic Metabolic Panel: Recent Labs  Lab 12/13/23 2014 12/19/23 2216  NA 140 135  K 3.6 4.4  CL 102 102  CO2 25 17*  GLUCOSE 177* 424*  BUN 31* 45*  CREATININE 6.12* 8.45*  CALCIUM  8.0* 8.4*   GFR: CrCl cannot be calculated (Unknown ideal weight.). Liver Function Tests: Recent Labs  Lab 12/13/23 2014 12/19/23 2216  AST 44* 34  ALT 41 36  ALKPHOS 716* 715*  BILITOT 1.1 1.2  PROT 5.3* 5.5*  ALBUMIN  2.5* 2.6*   No results for input(s): LIPASE, AMYLASE in the last 168 hours. No results for input(s): AMMONIA in the last 168 hours. Coagulation Profile: No results for input(s): INR, PROTIME in the last 168 hours. Cardiac Enzymes: No results for input(s): CKTOTAL, CKMB, CKMBINDEX, TROPONINI in the last 168 hours. BNP (last 3 results) No results for input(s): PROBNP in the last 8760 hours. HbA1C: No results for input(s): HGBA1C in the last 72 hours. CBG: No results for input(s): GLUCAP in the last 168 hours. Lipid Profile: No results for input(s): CHOL, HDL, LDLCALC, TRIG, CHOLHDL, LDLDIRECT in the last 72 hours. Thyroid  Function Tests: No results for input(s): TSH, T4TOTAL, FREET4, T3FREE, THYROIDAB in the last 72 hours. Anemia Panel: No results for input(s): VITAMINB12, FOLATE, FERRITIN, TIBC, IRON , RETICCTPCT in the last 72 hours. Urine analysis:    Component Value Date/Time   COLORURINE YELLOW 10/25/2023 2300   APPEARANCEUR CLOUDY (A) 10/25/2023 2300   APPEARANCEUR Hazy 11/10/2013 2043   LABSPEC 1.016 10/25/2023 2300   LABSPEC 1.031 11/10/2013 2043   PHURINE 6.0 10/25/2023 2300   GLUCOSEU >=500 (A) 10/25/2023 2300   GLUCOSEU >=500 11/10/2013 2043   HGBUR SMALL (A) 10/25/2023 2300   BILIRUBINUR NEGATIVE  10/25/2023 2300   BILIRUBINUR negative 06/04/2018 1035   BILIRUBINUR Negative 11/24/2015 1443   BILIRUBINUR Negative 11/10/2013 2043   KETONESUR 5 (A) 10/25/2023 2300   PROTEINUR >=300 (A) 10/25/2023 2300   UROBILINOGEN 0.2 09/14/2019 1732   NITRITE NEGATIVE 10/25/2023 2300   LEUKOCYTESUR NEGATIVE 10/25/2023 2300   LEUKOCYTESUR 1+ 11/10/2013 2043   Sepsis Labs: @LABRCNTIP (procalcitonin:4,lacticidven:4) )No results found for this or any previous visit (from the past 240 hours).   Radiological Exams on Admission: DG Chest Portable 1 View Result Date: 12/19/2023 CLINICAL DATA:  Shortness of breath. EXAM: PORTABLE CHEST 1 VIEW COMPARISON:  Multiple priors, most recently 12/13/2023 FINDINGS: Chronic cardiomegaly. Mild pulmonary edema. Suspect trace pleural effusions. No confluent airspace disease. No pneumothorax. No acute osseous findings. IMPRESSION: Chronic cardiomegaly. Mild pulmonary edema and trace pleural effusions. Electronically Signed   By: Andrea Gasman M.D.   On: 12/19/2023 21:27    Assessment/Plan   1. Hypervolemia; ESRD; acute on chronic combined systolic and diastolic CHF  - Nephrology consulted by ED and arranging inpatient HD   - SLIV, restrict fluids, monitor weight and I/Os, continue calcitriol  and bicarbonate, renally-dose medications    2. Abdominal pain and distension  - In setting of hypervolemia, anticipate improvement with dialysis, consider therapeutic paracentesis    3. RUE pain and swelling  - She reports 1-2 days of RUE pain and swelling  - Dialysis access in RUE was functioning at HD on 8/21 per pt report; no infectious s/s  - Check US    4. Insulin -dependent DM  - A1c was 11.2% in July 2025  - Continue long- and short-acting insulin     5. Hypertension  - Coreg    6. Bipolar disorder  - Continue Zyprexa , Lamictal      DVT prophylaxis: sq heparin   Code Status: Full  Level of Care: Level of care: Progressive Family Communication: None present   Disposition Plan:  Patient is from: Home  Anticipated d/c is to: Home Anticipated d/c date is: 12/23/23  Patient currently: Pending nephrology consultation, improved volume status  Consults called: Nephrology  Admission status: Inpatient     Ashley GORMAN Sprinkles, MD [facility]  12/20/2023, 12:02 AM

## 2023-12-19 NOTE — ED Triage Notes (Addendum)
 had dialysis yesterday, gets MWF pt was only able to complete half her session no fault of her own. Pt reports R arm swelling/pain in her dialysis arm. Pt also endorses abd distension x6 days.pt hypertensive. CBG 481 per EMS. Pt given 0.4mg  nitroglycerin  sublingual

## 2023-12-19 NOTE — ED Notes (Signed)
 Pt states she is a very difficult IV access, vein team paged for assistance. MD notified for possible delay in care.

## 2023-12-19 NOTE — ED Provider Notes (Signed)
 [place] EMERGENC[facility] DEPARTMENT AT [facility] Provider Note   CSN: [id number] Arrival date & time: 12/19/23  2005     Patient presents with: No chief complaint on file.   Ashley Freeman is a 33 y.o. female.  {Add pertinent medical, surgical, social history, OB history to HPI:4765} 33 year old female with a past medical history of ESRD on dialysis Monday, Wednesday, Friday presents to the ED via EMS with a chief complaint of right arm pain in abdominal distention.  Patient reports she has been only getting dialysis about half of the session due to childcare issues that she has no one to watch her kid.  She endorses severe pain to the right upper extremity, reports they tried to obtain blood from there yesterday and she has continued to have pain.  She also has significant abdominal distention on exam, tells me that she does not feel short of breath but she does feel like she needs to have fluid pulled out.  She was dialyzed yesterday but reports she also only got half of session.  She does arrive in the ED hyperglycemic and hypertensive.  The history is provided by the patient.       Prior to Admission medications   Medication Sig Start Date End Date Taking? Authorizing Provider  albuterol  (VENTOLIN  HFA) 108 (90 Base) MCG/ACT inhaler Inhale 2 puffs into the lungs every 4 (four) hours as needed for wheezing or shortness of breath. 11/24/22  Yes [provider]  bumetanide  (BUMEX ) 2 MG tablet Take 10 mg by mouth daily.   Yes [provider]  carvedilol  (COREG ) 25 MG tablet Take 25 mg by mouth 2 (two) times daily with a meal.   Yes [provider]  DULoxetine  (CYMBALTA ) 20 MG capsule Take 40 mg by mouth daily. 07/29/23  Yes [provider]  famotidine  (PEPCID ) 20 MG tablet Take 20 mg by mouth daily. 07/29/23  Yes [provider]  fluticasone  (FLONASE ) 50 MCG/ACT nasal spray Place 2 sprays into both nostrils daily. 12/11/23  12/10/24 Yes [provider]  hydrOXYzine  (ATARAX ) 25 MG tablet Take 25 mg by mouth 3 (three) times daily as needed for anxiety, itching, nausea or vomiting.   Yes [provider]  insulin  aspart (NOVOLOG ) 100 UNIT/ML injection Inject 5 Units into the skin 3 (three) times daily with meals.   Yes [provider]  lamoTRIgine  (LAMICTAL ) 200 MG tablet Take 1 tablet (200 mg total) by mouth daily. 10/29/23  Yes Regalado, Belkys A, MD  LANTUS  SOLOSTAR 100 UNIT/ML Solostar Pen Inject 10 Units into the skin daily.   Yes [provider]  losartan  (COZAAR ) 100 MG tablet Take 200 mg by mouth daily. 03/25/23  Yes [provider]  OLANZapine  zydis (ZYPREXA ) 5 MG disintegrating tablet Take 5 mg by mouth at bedtime. 09/23/23 02/08/24 Yes [provider]  omeprazole  (PRILOSEC) 40 MG capsule Take 40 mg by mouth daily. 12/11/23 02/09/24 Yes [provider]  ondansetron  (ZOFRAN -ODT) 4 MG disintegrating tablet Take 1 tablet (4 mg total) by mouth every 8 (eight) hours as needed for nausea or vomiting. 06/23/23  Yes Davis, Jonathon H, MD  Pancrelipase , Lip-Prot-Amyl, 3000-9500 units CPEP Take 3,000 units of lipase by mouth 3 (three) times daily before meals. 09/23/23 02/08/24 Yes [provider]  sodium bicarbonate  650 MG tablet Take 650 mg by mouth 2 (two) times daily. 07/29/23  Yes [provider]  SUMAtriptan  (IMITREX ) 50 MG tablet Take 50 mg by mouth every 2 (two) hours  as needed for migraine or headache. 06/20/22  Yes [provider]  atorvastatin  (LIPITOR ) 80 MG tablet Take 1 tablet (80 mg total) by mouth daily. Patient not taking: Reported on 12/23/2023 10/15/23   Jonel Lonni SQUIBB, MD  calcitRIOL  (ROCALTROL ) 0.25 MCG capsule Take 5 capsules (1.25 mcg total) by mouth every Tuesday, Thursday, and Saturday at 6 PM. Patient taking differently: Take 1.25 mcg by mouth every Monday, Wednesday, and Friday. 07/09/23   Cindy Garnette POUR, MD   prochlorperazine  (COMPAZINE ) 5 MG tablet Take 5 mg by mouth every 6 (six) hours as needed for nausea or vomiting.    [provider]  sacubitril-valsartan (ENTRESTO) 24-26 MG Take 1 tablet by mouth 2 (two) times daily.    [provider]    Allergies: Keflex  [cephalexin ], Penicillins, Vibramycin  [doxycycline ], Benadryl  [diphenhydramine ], Dilaudid  [hydromorphone ], Methotrexate derivatives, and Roxicodone  [oxycodone ]    Review of Systems  Constitutional:  Negative for chills and fever.  Respiratory:  Negative for shortness of breath.   Cardiovascular:  Positive for leg swelling. Negative for chest pain.  Gastrointestinal:  Positive for abdominal distention. Negative for nausea and vomiting.  Genitourinary:  Negative for flank pain.  Musculoskeletal:  Negative for back pain.  All other systems reviewed and are negative.   Updated Vital Signs BP (!) 160/84 (BP Location: Left Arm)   Pulse 84   Temp 98.1 F (36.7 C) (Oral)   Resp (!) 22   Ht 5' 3 (1.6 m)   Wt 69.4 kg   SpO2 99%   BMI 27.10 kg/m   Physical Exam Vitals and nursing note reviewed.  Constitutional:      Appearance: Normal appearance.  Abdominal:     General: There is distension.     Comments: Bowel sounds are diminished, significant abdominal distention.  Neurological:     Mental Status: She is alert.     (all labs ordered are listed, but only abnormal results are displayed) Labs Reviewed  CBC WITH DIFFERENTIAL/PLATELET - Abnormal; Notable for the following components:      Result Value   Hemoglobin 11.1 (*)    RDW 16.8 (*)    All other components within normal limits  COMPREHENSIVE METABOLIC PANEL WITH GFR - Abnormal; Notable for the following components:   CO2 17 (*)    Glucose, Bld 424 (*)    BUN 45 (*)    Creatinine, Ser 8.45 (*)    Calcium  8.4 (*)    Total Protein 5.5 (*)    Albumin  2.6 (*)    Alkaline Phosphatase 715 (*)    GFR, Estimated 6 (*)    Anion gap 16 (*)    All other  components within normal limits  BRAIN NATRIURETIC PEPTIDE - Abnormal; Notable for the following components:   B Natriuretic Peptide >4,500.0 (*)    All other components within normal limits  HEMOGLOBIN A1C - Abnormal; Notable for the following components:   Hgb A1c MFr Bld 10.8 (*)    All other components within normal limits  HEPATITIS B SURFACE ANTIBODY, QUANTITATIVE - Abnormal; Notable for the following components:   Hep B S AB Quant (Post) <3.5 (*)    All other components within normal limits  GLUCOSE, CAPILLARY - Abnormal; Notable for the following components:   Glucose-Capillary 398 (*)    All other components within normal limits  BASIC METABOLIC PANEL WITH GFR - Abnormal; Notable for the following components:   CO2 13 (*)    Glucose, Bld 281 (*)    BUN  49 (*)    Creatinine, Ser 8.39 (*)    Calcium  8.5 (*)    GFR, Estimated 6 (*)    Anion gap 19 (*)    All other components within normal limits  CBC - Abnormal; Notable for the following components:   WBC 11.4 (*)    RBC 3.63 (*)    Hemoglobin 9.9 (*)    HCT 32.5 (*)    RDW 16.7 (*)    All other components within normal limits  GLUCOSE, CAPILLARY - Abnormal; Notable for the following components:   Glucose-Capillary 34 (*)    All other components within normal limits  GLUCOSE, CAPILLARY - Abnormal; Notable for the following components:   Glucose-Capillary 137 (*)    All other components within normal limits  GLUCOSE, CAPILLARY - Abnormal; Notable for the following components:   Glucose-Capillary 146 (*)    All other components within normal limits  CBG MONITORING, ED - Abnormal; Notable for the following components:   Glucose-Capillary 355 (*)    All other components within normal limits  BETA-HYDROXYBUTYRIC ACID  HEPATITIS B SURFACE ANTIGEN  GLUCOSE, CAPILLARY  TROPONIN I (HIGH SENSITIVITY)    EKG: None  Radiology: IR Paracentesis Result Date: 12/24/2023 INDICATION: 33 year old female with end-stage renal  disease and recurrent ascites for diagnostic and therapeutic paracentesis. EXAM: ULTRASOUND GUIDED RIGHT PARACENTESIS MEDICATIONS: 1% lidocaine  10 mL COMPLICATIONS: None immediate. PROCEDURE: Informed written consent was obtained from the patient after a discussion of the risks, benefits and alternatives to treatment. A timeout was performed prior to the initiation of the procedure. Initial ultrasound scanning demonstrates a large amount of ascites within the right lower abdominal quadrant. The right lower abdomen was prepped and draped in the usual sterile fashion. 1% lidocaine  was used for local anesthesia. Following this, a 19 gauge, 7-cm, Yueh catheter was introduced. An ultrasound image was saved for documentation purposes. The paracentesis was performed. The catheter was removed and a dressing was applied. The patient tolerated the procedure well without immediate post procedural complication. FINDINGS: A total of approximately 5 L of clear, yellow fluid was removed. Samples were sent to the laboratory as requested by the clinical team. IMPRESSION: Successful ultrasound-guided paracentesis yielding 5 liters of peritoneal fluid. PLAN: Performed By Lavanda Jurist, PA-C Electronically Signed   By: CHRISTELLA.  Shick M.D.   On: 12/24/2023 10:04    {Document cardiac monitor, telemetry assessment procedure when appropriate:32947} Procedures   Medications Ordered in the ED  HYDROmorphone  (DILAUDID ) injection 1 mg (1 mg Intravenous Given 12/19/23 2203)  insulin  aspart (novoLOG ) injection 12 Units (12 Units Subcutaneous Given 12/20/23 0206)  dextrose  50 % solution 50 mL (50 mLs Intravenous Given 12/20/23 0831)      {Click here for ABCD2, HEART and other calculators REFRESH Note before signing:1}                              Medical Decision Making Amount and/or Complexity of Data Reviewed Labs: ordered. Radiology: ordered.  Risk Prescription drug management. Decision regarding  hospitalization.       Final diagnoses:  ESRD needing dialysis (HCC)  Elevated blood pressure reading  Essential hypertension  Other ascites  Non compliance with medical treatment  Hyperglycemia  Hypoalbuminemia    ED Discharge Orders          Ordered    Increase activity slowly        12/20/23 1513    Diet - low sodium heart  healthy        12/20/23 1513

## 2023-12-20 ENCOUNTER — Encounter (HOSPITAL_COMMUNITY)

## 2023-12-20 DIAGNOSIS — N289 Disorder of kidney and ureter, unspecified: Secondary | ICD-10-CM | POA: Diagnosis not present

## 2023-12-20 DIAGNOSIS — E8770 Fluid overload, unspecified: Secondary | ICD-10-CM | POA: Diagnosis not present

## 2023-12-20 LAB — BETA-HYDROXYBUTYRIC ACID: Beta-Hydroxybutyric Acid: 0.13 mmol/L (ref 0.05–0.27)

## 2023-12-20 LAB — BASIC METABOLIC PANEL WITH GFR
Anion gap: 19 — ABNORMAL HIGH (ref 5–15)
BUN: 49 mg/dL — ABNORMAL HIGH (ref 6–20)
CO2: 13 mmol/L — ABNORMAL LOW (ref 22–32)
Calcium: 8.5 mg/dL — ABNORMAL LOW (ref 8.9–10.3)
Chloride: 106 mmol/L (ref 98–111)
Creatinine, Ser: 8.39 mg/dL — ABNORMAL HIGH (ref 0.44–1.00)
GFR, Estimated: 6 mL/min — ABNORMAL LOW (ref >60)
Glucose, Bld: 281 mg/dL — ABNORMAL HIGH (ref 70–99)
Potassium: 4.3 mmol/L (ref 3.5–5.1)
Sodium: 138 mmol/L (ref 135–145)

## 2023-12-20 LAB — CBC
HCT: 32.5 % — ABNORMAL LOW (ref 36.0–46.0)
Hemoglobin: 9.9 g/dL — ABNORMAL LOW (ref 12.0–15.0)
MCH: 27.3 pg (ref 26.0–34.0)
MCHC: 30.5 g/dL (ref 30.0–36.0)
MCV: 89.5 fL (ref 80.0–100.0)
Platelets: 352 K/uL (ref 150–400)
RBC: 3.63 MIL/uL — ABNORMAL LOW (ref 3.87–5.11)
RDW: 16.7 % — ABNORMAL HIGH (ref 11.5–15.5)
WBC: 11.4 K/uL — ABNORMAL HIGH (ref 4.0–10.5)
nRBC: 0 % (ref 0.0–0.2)

## 2023-12-20 LAB — CBG MONITORING, ED: Glucose-Capillary: 355 mg/dL — ABNORMAL HIGH (ref 70–99)

## 2023-12-20 LAB — GLUCOSE, CAPILLARY
Glucose-Capillary: 398 mg/dL — ABNORMAL HIGH (ref 70–99)
Glucose-Capillary: 76 mg/dL (ref 70–99)
Glucose-Capillary: 34 mg/dL — CL (ref 70–99)
Glucose-Capillary: 137 mg/dL — ABNORMAL HIGH (ref 70–99)
Glucose-Capillary: 146 mg/dL — ABNORMAL HIGH (ref 70–99)

## 2023-12-20 LAB — HEMOGLOBIN A1C
Hgb A1c MFr Bld: 10.8 % — ABNORMAL HIGH (ref 4.8–5.6)
Mean Plasma Glucose: 263.26 mg/dL

## 2023-12-20 LAB — HEPATITIS B SURFACE ANTIGEN: Hepatitis B Surface Ag: NONREACTIVE

## 2023-12-20 MED ORDER — LIDOCAINE-PRILOCAINE 2.5-2.5 % EX CREA: 1.0000 | TOPICAL_CREAM | CUTANEOUS | Status: DC | PRN

## 2023-12-20 MED ORDER — HEPARIN SODIUM (PORCINE) 1000 UNIT/ML DIALYSIS: 1000.0000 [IU] | INTRAMUSCULAR | Status: DC | PRN

## 2023-12-20 MED ORDER — HEPARIN SODIUM (PORCINE) 1000 UNIT/ML DIALYSIS: 40.0000 [IU]/kg | INTRAMUSCULAR | Status: DC | PRN

## 2023-12-20 MED ORDER — DEXTROSE 50 % IV SOLN
INTRAVENOUS | Status: AC
  Administered 2023-12-20: 50 mL via INTRAVENOUS
  Filled 2023-12-20: qty 50

## 2023-12-20 MED ORDER — DEXTROSE 50 % IV SOLN: 1.0000 | Freq: Once | INTRAVENOUS | Status: AC

## 2023-12-20 MED ORDER — PENTAFLUOROPROP-TETRAFLUOROETH EX AERO: 1.0000 | INHALATION_SPRAY | CUTANEOUS | Status: DC | PRN

## 2023-12-20 MED ORDER — ALTEPLASE 2 MG IJ SOLR: 2.0000 mg | Freq: Once | INTRAMUSCULAR | Status: DC | PRN

## 2023-12-20 MED ORDER — LIDOCAINE HCL (PF) 1 % IJ SOLN: 5.0000 mL | INTRAMUSCULAR | Status: DC | PRN

## 2023-12-20 NOTE — Consult Note (Signed)
 Renal Service Consult Note [facility] [name], MD  Patient: Ashley Freeman Date: 12/20/2023 Requesting Physician: Dr. Maree  Reason for Consult: ESRD patient with abdominal pain and distention HPI: The patient is a 33 y.o. year-old w/ PMH as below who presented to ED yesterday evening complaining of right arm pain and abdominal pain and distention.  Patient missed to have her dialysis session yesterday due to arranging childcare.  They noted her legs were getting swollen 1 to 2 days ago.  Also abdominal pain and distention and with history of needing paracentesis from time to time.  No fevers or chills.  In the ED blood pressure open normal heart rate, afebrile, normal O2 sat on room air.  BUN 45, albumin  2.6, normal WBC, normal potassium.  BNP greater than 4500.  Chest x-ray with mild pulmonary edema, cardiomegaly and trace effusions.  Patient was admitted.  Nephrology is asked to see the patient for dialysis.   Pt seen in dialysis unit.  Stomach pains are better, no complaints at this time.   ROS - denies CP, no joint pain, no HA, no blurry vision, no rash, no diarrhea, no nausea/ vomiting   Past Medical History  Past Medical History:  Diagnosis Date   Abscess, gluteal, right 08/24/2013   Anemia 02/19/2012   Bartholin's gland abscess 09/19/2013   Bipolar disorder (HCC)    BV (bacterial vaginosis) 11/24/2015   Depression    Diabetes mellitus type I (HCC) 2001   Diagnosed at age 41 ; Type I   Diarrhea 05/30/2016   DKA (diabetic ketoacidoses) 08/19/2013   Also in 2018   ESRD (end stage renal disease) (HCC)    Gonorrhea 08/2011   Treated in 09/2011   HFrEF (heart failure with reduced ejection fraction) (HCC)    a. 2022 Echo: EF 40%; b. 10/2021 Echo: EF 55%; b. 07/2022 MV: No ischemia. EF 31%; c. 08/2022 Echo: EF 35%, mildly dil RV, sev TR.   History of trichomoniasis 05/31/2016   Hyperlipidemia 03/28/2016   Hypertension    NICM (nonischemic  cardiomyopathy) (HCC)    Sepsis (HCC) 09/19/2013   Past Surgical History  Past Surgical History:  Procedure Laterality Date   A/V FISTULAGRAM Right 06/17/2023   Procedure: A/V Fistulagram;  Surgeon: Marea Selinda GORMAN, MD;  Location: [facility] INVASIVE CV LAB;  Service: Cardiovascular;  Laterality: Right;   A/V SHUNT INTERVENTION N/A 09/25/2023   Procedure: A/V SHUNT INTERVENTION;  Surgeon: Pearline Norman GORMAN, MD;  Location: [facility] PV LAB;  Service: Cardiovascular;  Laterality: N/A;   AV FISTULA PLACEMENT Right 07/06/2022   Procedure: ARTERIOVENOUS GRAFT CREATION;  Surgeon: Gretta Lonni PARAS, MD;  Location: [facility] OR;  Service: Vascular;  Laterality: Right;   CESAREAN SECTION N/A 10/05/2019   Procedure: CESAREAN SECTION;  Surgeon: Izell Harari, MD;  Location: [facility] LD ORS;  Service: Obstetrics;  Laterality: N/A;   CHOLECYSTECTOMY N/A 07/02/2023   Procedure: LAPAROSCOPIC CHOLECYSTECTOMY;  Surgeon: Ebbie Cough, MD;  Location: [facility] OR;  Service: General;  Laterality: N/A;   INCISION AND DRAINAGE ABSCESS Left 09/28/2019   Procedure: INCISION AND DRAINAGE VULVAR ABCESS;  Surgeon: Edsel Norleen GAILS, MD;  Location: [facility] OR;  Service: Gynecology;  Laterality: Left;   INCISION AND DRAINAGE PERIRECTAL ABSCESS Right 08/18/2013   Procedure: IRRIGATION AND DEBRIDEMENT GLUTEAL ABSCESS;  Surgeon: Lynda Leos, MD;  Location: [facility] OR;  Service: General;  Laterality: Right;   INCISION AND DRAINAGE PERIRECTAL ABSCESS Right 09/19/2013   Procedure: IRRIGATION AND DEBRIDEMENT RIGHT GLUTEAL AND LABIAL ABSCESSES;  Surgeon: Lynda Leos, MD;  Location: [facility] OR;  Service: General;  Laterality: Right;   INCISION AND DRAINAGE PERIRECTAL ABSCESS Right 09/24/2013   Procedure: IRRIGATION AND DEBRIDEMENT PERIRECTAL ABSCESS;  Surgeon: Lynwood MALVA Pina, MD;  Location: [facility] OR;  Service: General;  Laterality: Right;   IR PARACENTESIS  08/28/2023   IR PARACENTESIS  11/04/2023   Family History  Family History  Problem Relation Age of Onset    Asthma Mother    Carpal tunnel syndrome Mother    Gout Father    Diabetes Paternal Grandmother    Anesthesia problems Neg Hx    Social History  reports that she has never smoked. She has never been exposed to tobacco smoke. She has never used smokeless tobacco. She reports that she does not currently use alcohol. She reports that she does not use drugs. Allergies  Allergies  Allergen Reactions   Cephalexin  Anaphylaxis and Other (See Comments)    Has gotten ceftriaxone  in the past    Doxycycline  Anaphylaxis   Penicillins Anaphylaxis, Hives and Rash    Per patient her reaction is anaphylactic.    Benadryl  [Diphenhydramine ] Itching   Dilaudid  [Hydromorphone ] Itching   Methotrexate Derivatives Rash   Oxycodone  Itching   Home medications Prior to Admission medications   Medication Sig Start Date End Date Taking? Authorizing Provider  albuterol  (VENTOLIN  HFA) 108 (90 Base) MCG/ACT inhaler Inhale 2 puffs into the lungs every 4 (four) hours as needed for wheezing or shortness of breath. 11/24/22  Yes [provider]  bumetanide  (BUMEX ) 2 MG tablet Take 10 mg by mouth daily.   Yes [provider]  carvedilol  (COREG ) 25 MG tablet Take 25 mg by mouth 2 (two) times daily with a meal.   Yes [provider]  DULoxetine  (CYMBALTA ) 20 MG capsule Take 20 mg by mouth 2 (two) times daily. 07/29/23  Yes [provider]  famotidine  (PEPCID ) 20 MG tablet Take 20 mg by mouth daily before breakfast. 07/29/23  Yes [provider]  fluticasone  (FLONASE ) 50 MCG/ACT nasal spray Place 2 sprays into both nostrils daily. 12/11/23 12/10/24 Yes [provider]  Glucagon  (BAQSIMI  ONE PACK) 3 MG/DOSE POWD 3 mg. 12/09/23  Yes [provider]  HYDROmorphone  (DILAUDID ) 2 MG tablet Take 2 mg by mouth every 4 (four) hours as needed for severe pain (pain score 7-10).   Yes [provider]  hydrOXYzine  (ATARAX ) 25 MG tablet Take 25 mg by mouth 3 (three) times  daily as needed for anxiety, itching, nausea or vomiting.   Yes [provider]  insulin  aspart (NOVOLOG ) 100 UNIT/ML injection Inject 5 Units into the skin 3 (three) times daily with meals.   Yes [provider]  lamoTRIgine  (LAMICTAL ) 200 MG tablet Take 1 tablet (200 mg total) by mouth daily. 10/29/23  Yes Regalado, Belkys A, MD  LANTUS  SOLOSTAR 100 UNIT/ML Solostar Pen Inject 10 Units into the skin in the morning.   Yes [provider]  losartan  (COZAAR ) 100 MG tablet Take 100 mg by mouth daily. 03/25/23  Yes [provider]  OLANZapine  zydis (ZYPREXA ) 5 MG disintegrating tablet Take 5 mg by mouth at bedtime. 09/23/23 12/22/23 Yes [provider]  omeprazole  (PRILOSEC) 40 MG capsule Take 40 mg by mouth daily. 12/11/23 02/09/24 Yes [provider]  ondansetron  (ZOFRAN -ODT) 4 MG disintegrating tablet Take 1 tablet (4 mg total) by mouth every 8 (eight) hours as needed for nausea or vomiting. 06/23/23  Yes Nicholaus Cassondra DEL, MD  Pancrelipase , Lip-Prot-Amyl,  3000-9500 units CPEP Take 3,000 units of lipase by mouth 2 (two) times daily with a meal. 09/23/23 12/22/23 Yes [provider]  sodium bicarbonate  650 MG tablet Take 1,300 mg by mouth 2 (two) times daily. 07/29/23  Yes [provider]  SUMAtriptan  (IMITREX ) 50 MG tablet Take 50 mg by mouth every 2 (two) hours as needed for migraine or headache. 06/20/22  Yes [provider]  atorvastatin  (LIPITOR ) 80 MG tablet Take 1 tablet (80 mg total) by mouth daily. Patient not taking: Reported on 11/26/2023 10/15/23   Jonel Lonni SQUIBB, MD  calcitRIOL  (ROCALTROL ) 0.25 MCG capsule Take 5 capsules (1.25 mcg total) by mouth every Tuesday, Thursday, and Saturday at 6 PM. Patient taking differently: Take 1.25 mcg by mouth every Monday, Wednesday, and Friday. 07/09/23   Cindy Garnette POUR, MD  dicyclomine  (BENTYL ) 20 MG tablet Take 20 mg by mouth 2 (two) times daily as needed for spasms. Patient  not taking: Reported on 12/20/2023    [provider]     Vitals:   12/20/23 1230 12/20/23 1249 12/20/23 1253 12/20/23 1303  BP: (!) 155/89 (!) 153/82 (!) 161/93   Pulse: 79 81 80   Resp: 12 19 19    Temp:  97.8 F (36.6 C)    TempSrc:      SpO2: 100% 100% 99%   Weight:    69.4 kg  Height:       Exam Gen alert, no distress No rash, cyanosis or gangrene Sclera anicteric, throat clear  No jvd or bruits Chest clear bilat to bases, no rales/ wheezing RRR no MRG Abd soft ntnd no mass 1-2+ ascites +bs GU deferred MS no joint effusions or deformity Ext trace LE or UE edema, no other edema Neuro is alert, Ox 3 , nf     RUE AVG + bruit  Home bp meds: Bumex  10mg  every day Coreg  25 bid Losartan  100 qd   OP HD: TTS [facility] [address] from 11/09/23 -> 3h  60kg  B350   AVG  Hep 1600 + 600 u/hr    Assessment/ Plan: Hypervolemia: With abdominal pain and distention, possible early pulmonary edema as well.  Patient is getting dialysis this morning for volume overload with 4 to 5 L goal. ESRD: on HD TTS. HD in progress this morning.  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      Myer Fret  MD [facility] 12/20/2023, 1:34 PM  Recent Labs  Lab 12/13/23 2014 12/19/23 2216 12/20/23 0322 12/20/23 0726  HGB 8.9* 11.1*  --  9.9*  ALBUMIN  2.5* 2.6*  --   --   CALCIUM  8.0* 8.4* 8.5*  --   CREATININE 6.12* 8.45* 8.39*  --   K 3.6 4.4 4.3  --    Inpatient medications:  [START ON 12/21/2023] calcitRIOL   1.25 mcg Oral Q T,Th,Sat-1800   carvedilol   12.5 mg Oral BID WC   Chlorhexidine  Gluconate Cloth  6 each Topical Q0600   diphenhydrAMINE   25 mg Intravenous Once   famotidine   20 mg Oral QAC breakfast   heparin   5,000 Units Subcutaneous Q8H   insulin  aspart  0-5 Units Subcutaneous QHS   insulin  aspart  0-6 Units Subcutaneous TID WC   insulin  glargine-yfgn  10  Units Subcutaneous Daily   lamoTRIgine   200 mg Oral Daily   lipase/protease/amylase  12,000  Units Oral TID with meals   OLANZapine  zydis  5 mg Oral QHS   sodium bicarbonate   1,300 mg Oral BID   sodium chloride  flush  3 mL Intravenous Q12H    acetaminophen  **OR** acetaminophen , alteplase , heparin , heparin , hydrALAZINE , HYDROmorphone , hydrOXYzine , lidocaine  (PF), lidocaine -prilocaine , pentafluoroprop-tetrafluoroeth, senna, trimethobenzamide 

## 2023-12-20 NOTE — ED Notes (Signed)
 Medication delayed due to inability to give, this RN has been at bedside for a separate pt for safety obs, Charge RN notified of this.

## 2023-12-20 NOTE — Progress Notes (Addendum)
 Taxi voucher given to Elora FLETCHER RN for discharge.

## 2023-12-20 NOTE — Progress Notes (Signed)
 Pt receives OP HD TTS via [employer]. Will assist as needed.   [name] Dialysis Navigator [phone]

## 2023-12-20 NOTE — Procedures (Signed)
 S: pt seen in [facility], no c/o's, getting 4-5 L off in 3.5 hrs  Vitals:   12/20/23 1230 12/20/23 1249 12/20/23 1253 12/20/23 1303  BP: (!) 155/89 (!) 153/82 (!) 161/93   Pulse: 79 81 80   Resp: 12 19 19    Temp:  97.8 F (36.6 C)    TempSrc:      SpO2: 100% 100% 99%   Weight:    69.4 kg  Height:        Recent Labs  Lab 12/13/23 2014 12/19/23 2216 12/20/23 0322 12/20/23 0726  HGB 8.9* 11.1*  --  9.9*  ALBUMIN  2.5* 2.6*  --   --   CALCIUM  8.0* 8.4* 8.5*  --   CREATININE 6.12* 8.45* 8.39*  --   K 3.6 4.4 4.3  --     Inpatient medications:  [START ON 12/21/2023] calcitRIOL   1.25 mcg Oral Q T,Th,Sat-1800   carvedilol   12.5 mg Oral BID WC   Chlorhexidine  Gluconate Cloth  6 each Topical Q0600   diphenhydrAMINE   25 mg Intravenous Once   famotidine   20 mg Oral QAC breakfast   heparin   5,000 Units Subcutaneous Q8H   insulin  aspart  0-5 Units Subcutaneous QHS   insulin  aspart  0-6 Units Subcutaneous TID WC   insulin  glargine-yfgn  10 Units Subcutaneous Daily   lamoTRIgine   200 mg Oral Daily   lipase/protease/amylase  12,000 Units Oral TID with meals   OLANZapine  zydis  5 mg Oral QHS   sodium bicarbonate   1,300 mg Oral BID   sodium chloride  flush  3 mL Intravenous Q12H    acetaminophen  **OR** acetaminophen , alteplase , heparin , heparin , hydrALAZINE , HYDROmorphone , hydrOXYzine , lidocaine  (PF), lidocaine -prilocaine , pentafluoroprop-tetrafluoroeth, senna, trimethobenzamide   I was present at the procedure, reviewed the HD regimen and made appropriate changes.   Myer Fret MD  [facility] 12/20/2023, 1:46 PM

## 2023-12-20 NOTE — Progress Notes (Signed)
 D/c order noted. Contacted op hd clinic to inform pt will be back tomorrow and will fax over d/c summary.   Euriah Matlack Dialysis Navigator

## 2023-12-20 NOTE — Discharge Summary (Signed)
 Physician Discharge Summary  Caryssa Elzey Freeman FMW:[id number] DOB: Sep 02, 1990 DOA: 12/19/2023  PCP: Keven Crumbly Pap, MD  Admit date: 12/19/2023  Discharge date: 12/20/2023  Admitted From:Home  Disposition:  Home  Recommendations for Outpatient Follow-up:  Follow up with PCP in 1-2 weeks Continue routine hemodialysis TTS with next session on 8/23 Continue home medications as prior  Home Health: None  Equipment/Devices: None  Discharge Condition:Stable  CODE STATUS: Full  Diet recommendation: Heart Healthy/carb modified  Brief/Interim Summary: Ashley Freeman is a pleasant 33 y.o. female with medical history significant for hypertension, insulin -dependent diabetes mellitus, bipolar disorder, chronic combined systolic and diastolic CHF, and ESRD on hemodialysis who presents with multiple complaints including RUE pain and swelling and abdominal pain and distention.  She underwent hemodialysis on 8/22 with 4.9 L of fluid removed and is overall doing quite well and now stable for discharge.  She will continue her hemodialysis as scheduled on 8/23 and routinely goes to hemodialysis TTS.  No other acute events or concerns noted.  Discharge Diagnoses:  Principal Problem:   Hypervolemia associated with renal insufficiency Active Problems:   Acute on chronic combined systolic and diastolic CHF (congestive heart failure) (HCC)   Essential hypertension   Bipolar disorder (HCC)   Type 1 diabetes mellitus with hyperglycemia (HCC)   Pain and swelling of right upper extremity  Principal discharge diagnosis: Hypervolemia in the setting of ESRD.  Discharge Instructions  Discharge Instructions     Diet - low sodium heart healthy   Complete by: As directed    Increase activity slowly   Complete by: As directed       Allergies as of 12/20/2023       Reactions   Cephalexin  Anaphylaxis, Other (See Comments)   Has gotten ceftriaxone  in the past    Doxycycline   Anaphylaxis   Penicillins Anaphylaxis, Hives, Rash   Per patient her reaction is anaphylactic.    Benadryl  [diphenhydramine ] Itching   Dilaudid  [hydromorphone ] Itching   Methotrexate Derivatives Rash   Oxycodone  Itching        Medication List     TAKE these medications    albuterol  108 (90 Base) MCG/ACT inhaler Commonly known as: VENTOLIN  HFA Inhale 2 puffs into the lungs every 4 (four) hours as needed for wheezing or shortness of breath.   atorvastatin  80 MG tablet Commonly known as: LIPITOR  Take 1 tablet (80 mg total) by mouth daily.   Baqsimi  One Pack 3 MG/DOSE Powd Generic drug: Glucagon  3 mg.   bumetanide  2 MG tablet Commonly known as: BUMEX  Take 10 mg by mouth daily.   calcitRIOL  0.25 MCG capsule Commonly known as: ROCALTROL  Take 5 capsules (1.25 mcg total) by mouth every Tuesday, Thursday, and Saturday at 6 PM. What changed: when to take this   carvedilol  25 MG tablet Commonly known as: COREG  Take 25 mg by mouth 2 (two) times daily with a meal.   dicyclomine  20 MG tablet Commonly known as: BENTYL  Take 20 mg by mouth 2 (two) times daily as needed for spasms.   DULoxetine  20 MG capsule Commonly known as: CYMBALTA  Take 20 mg by mouth 2 (two) times daily.   famotidine  20 MG tablet Commonly known as: PEPCID  Take 20 mg by mouth daily before breakfast.   fluticasone  50 MCG/ACT nasal spray Commonly known as: FLONASE  Place 2 sprays into both nostrils daily.   HYDROmorphone  2 MG tablet Commonly known as: DILAUDID  Take 2 mg by mouth every 4 (four) hours as needed for severe pain (pain  score 7-10).   hydrOXYzine  25 MG tablet Commonly known as: ATARAX  Take 25 mg by mouth 3 (three) times daily as needed for anxiety, itching, nausea or vomiting.   insulin  aspart 100 UNIT/ML injection Commonly known as: novoLOG  Inject 5 Units into the skin 3 (three) times daily with meals.   lamoTRIgine  200 MG tablet Commonly known as: LAMICTAL  Take 1 tablet (200 mg  total) by mouth daily.   Lantus  SoloStar 100 UNIT/ML Solostar Pen Generic drug: insulin  glargine Inject 10 Units into the skin in the morning.   losartan  100 MG tablet Commonly known as: COZAAR  Take 100 mg by mouth daily.   OLANZapine  zydis 5 MG disintegrating tablet Commonly known as: ZYPREXA  Take 5 mg by mouth at bedtime.   omeprazole  40 MG capsule Commonly known as: PRILOSEC Take 40 mg by mouth daily.   ondansetron  4 MG disintegrating tablet Commonly known as: ZOFRAN -ODT Take 1 tablet (4 mg total) by mouth every 8 (eight) hours as needed for nausea or vomiting.   Pancrelipase  (Lip-Prot-Amyl) 3000-9500 units Cpep Take 3,000 units of lipase by mouth 2 (two) times daily with a meal.   sodium bicarbonate  650 MG tablet Take 1,300 mg by mouth 2 (two) times daily.   SUMAtriptan  50 MG tablet Commonly known as: IMITREX  Take 50 mg by mouth every 2 (two) hours as needed for migraine or headache.        Follow-up Information     [name], [name], MD. Schedule an appointment as soon as possible for a visit in 1 week(s).   Specialty: Family Medicine Contact information: [address] [place] [address] [phone]                Allergies  Allergen Reactions   Cephalexin  Anaphylaxis and Other (See Comments)    Has gotten ceftriaxone  in the past    Doxycycline  Anaphylaxis   Penicillins Anaphylaxis, Hives and Rash    Per patient her reaction is anaphylactic.    Benadryl  [Diphenhydramine ] Itching   Dilaudid  [Hydromorphone ] Itching   Methotrexate Derivatives Rash   Oxycodone  Itching    Consultations: Nephrology   Procedures/Studies: DG Chest Portable 1 View Result Date: 12/19/2023 CLINICAL DATA:  Shortness of breath. EXAM: PORTABLE CHEST 1 VIEW COMPARISON:  Multiple priors, most recently 12/13/2023 FINDINGS: Chronic cardiomegaly. Mild pulmonary edema. Suspect trace pleural effusions. No confluent airspace disease. No pneumothorax. No acute osseous findings.  IMPRESSION: Chronic cardiomegaly. Mild pulmonary edema and trace pleural effusions. Electronically Signed   By: Andrea Gasman M.D.   On: 12/19/2023 21:27   DG Chest 2 View Result Date: 12/13/2023 CLINICAL DATA:  Shortness of breath, missed dialysis. EXAM: CHEST - 2 VIEW COMPARISON:  None Available. FINDINGS: The cardiac silhouette is mildly enlarged and unchanged in size. Low lung volumes are noted with subsequent crowding of the bronchovascular lung markings. No acute infiltrate, pleural effusion or pneumothorax is identified. A radiopaque vascular stent is seen within the medial aspect of the proximal right upper extremity. The visualized skeletal structures are unremarkable. IMPRESSION: Low lung volumes without acute or active cardiopulmonary disease. Electronically Signed   By: Suzen Dials M.D.   On: 12/13/2023 21:26   DG Chest Portable 1 View Result Date: 11/26/2023 CLINICAL DATA:  Shortness of breath EXAM: PORTABLE CHEST 1 VIEW COMPARISON:  11/17/2023 FINDINGS: Cardiomegaly. No confluent opacities, effusions or edema. No acute bony abnormality. IMPRESSION: Cardiomegaly.  No active disease. Electronically Signed   By: Franky Crease M.D.   On: 11/26/2023 01:07  Discharge Exam: Vitals:   12/20/23 1253 12/20/23 1353  BP: (!) 161/93 (!) 160/84  Pulse: 80 84  Resp: 19 (!) 22  Temp:  98.1 F (36.7 C)  SpO2: 99%    Vitals:   12/20/23 1249 12/20/23 1253 12/20/23 1303 12/20/23 1353  BP: (!) 153/82 (!) 161/93  (!) 160/84  Pulse: 81 80  84  Resp: 19 19  (!) 22  Temp: 97.8 F (36.6 C)   98.1 F (36.7 C)  TempSrc:    Oral  SpO2: 100% 99%    Weight:   69.4 kg   Height:        General: Pt is alert, awake, not in acute distress Cardiovascular: RRR, S1/S2 +, no rubs, no gallops Respiratory: CTA bilaterally, no wheezing, no rhonchi Abdominal: Soft, NT, ND, bowel sounds + Extremities: no edema, no cyanosis    The results of significant diagnostics from this hospitalization  (including imaging, microbiology, ancillary and laboratory) are listed below for reference.     Microbiology: No results found for this or any previous visit (from the past 240 hours).   Labs: BNP (last 3 results) Recent Labs    10/25/23 0830 11/26/23 0045 12/19/23 2216  BNP >4,500.0* >4,500.0* >4,500.0*   Basic Metabolic Panel: Recent Labs  Lab 12/13/23 2014 12/19/23 2216 12/20/23 0322  NA 140 135 138  K 3.6 4.4 4.3  CL 102 102 106  CO2 25 17* 13*  GLUCOSE 177* 424* 281*  BUN 31* 45* 49*  CREATININE 6.12* 8.45* 8.39*  CALCIUM  8.0* 8.4* 8.5*   Liver Function Tests: Recent Labs  Lab 12/13/23 2014 12/19/23 2216  AST 44* 34  ALT 41 36  ALKPHOS 716* 715*  BILITOT 1.1 1.2  PROT 5.3* 5.5*  ALBUMIN  2.5* 2.6*   No results for input(s): LIPASE, AMYLASE in the last 168 hours. No results for input(s): AMMONIA in the last 168 hours. CBC: Recent Labs  Lab 12/13/23 2014 12/19/23 2216 12/20/23 0726  WBC 10.4 7.7 11.4*  NEUTROABS 6.4 4.4  --   HGB 8.9* 11.1* 9.9*  HCT 29.0* 36.5 32.5*  MCV 89.8 89.7 89.5  PLT 280 284 352   Cardiac Enzymes: No results for input(s): CKTOTAL, CKMB, CKMBINDEX, TROPONINI in the last 168 hours. BNP: Invalid input(s): POCBNP CBG: Recent Labs  Lab 12/20/23 0037 12/20/23 0149 12/20/23 0603 12/20/23 0821 12/20/23 0851  GLUCAP 355* 398* 76 34* 137*   D-Dimer No results for input(s): DDIMER in the last 72 hours. Hgb A1c Recent Labs    12/20/23 0305  HGBA1C 10.8*   Lipid Profile No results for input(s): CHOL, HDL, LDLCALC, TRIG, CHOLHDL, LDLDIRECT in the last 72 hours. Thyroid  function studies No results for input(s): TSH, T4TOTAL, T3FREE, THYROIDAB in the last 72 hours.  Invalid input(s): FREET3 Anemia work up No results for input(s): VITAMINB12, FOLATE, FERRITIN, TIBC, IRON , RETICCTPCT in the last 72 hours. Urinalysis    Component Value Date/Time   COLORURINE YELLOW  10/25/2023 2300   APPEARANCEUR CLOUDY (A) 10/25/2023 2300   APPEARANCEUR Hazy 11/10/2013 2043   LABSPEC 1.016 10/25/2023 2300   LABSPEC 1.031 11/10/2013 2043   PHURINE 6.0 10/25/2023 2300   GLUCOSEU >=500 (A) 10/25/2023 2300   GLUCOSEU >=500 11/10/2013 2043   HGBUR SMALL (A) 10/25/2023 2300   BILIRUBINUR NEGATIVE 10/25/2023 2300   BILIRUBINUR negative 06/04/2018 1035   BILIRUBINUR Negative 11/24/2015 1443   BILIRUBINUR Negative 11/10/2013 2043   KETONESUR 5 (A) 10/25/2023 2300   PROTEINUR >=300 (A) 10/25/2023 2300   UROBILINOGEN  0.2 09/14/2019 1732   NITRITE NEGATIVE 10/25/2023 2300   LEUKOCYTESUR NEGATIVE 10/25/2023 2300   LEUKOCYTESUR 1+ 11/10/2013 2043   Sepsis Labs Recent Labs  Lab 12/13/23 2014 12/19/23 2216 12/20/23 0726  WBC 10.4 7.7 11.4*   Microbiology No results found for this or any previous visit (from the past 240 hours).   Time coordinating discharge: 35 minutes  SIGNED:   Adron JONETTA Fairly, DO [facility] 12/20/2023, 3:22 PM  If 7PM-7AM, please contact night-coverage [email or web address]

## 2023-12-20 NOTE — Plan of Care (Signed)
  Problem: Pain Managment: Goal: General experience of comfort will improve and/or be controlled Outcome: Progressing   Problem: Safety: Goal: Ability to remain free from injury will improve Outcome: Progressing   Problem: Skin Integrity: Goal: Risk for impaired skin integrity will decrease Outcome: Progressing

## 2023-12-20 NOTE — Progress Notes (Addendum)
 PROGRESS NOTE    Ashley Freeman  FMW:[id number] DOB: 23-Mar-1991 DOA: 12/19/2023 PCP: Keven Crumbly Pap, MD   Brief Narrative:    Ashley Freeman is a pleasant 33 y.o. female with medical history significant for hypertension, insulin -dependent diabetes mellitus, bipolar disorder, chronic combined systolic and diastolic CHF, and ESRD on hemodialysis who presents with multiple complaints including RUE pain and swelling and abdominal pain and distention.  Plan is for hemodialysis along with possible paracentesis.  Assessment & Plan:   Principal Problem:   Hypervolemia associated with renal insufficiency Active Problems:   Acute on chronic combined systolic and diastolic CHF (congestive heart failure) (HCC)   Essential hypertension   Bipolar disorder (HCC)   Type 1 diabetes mellitus with hyperglycemia (HCC)   Pain and swelling of right upper extremity  Assessment and Plan:   1. Hypervolemia; ESRD; acute on chronic combined systolic and diastolic CHF  - Nephrology consulted by ED and arranging inpatient HD   - SLIV, restrict fluids, monitor weight and I/Os, continue calcitriol  and bicarbonate, renally-dose medications     2. Abdominal pain and distension  - In setting of hypervolemia, anticipate improvement with dialysis - Abdomen appears to be tense and will require paracentesis   3. RUE pain and swelling  - She reports 1-2 days of RUE pain and swelling  - Dialysis access in RUE was functioning at HD on 8/21 per pt report; no infectious s/s  - Does not appear swollen on my exam and pt denies pain in this area   4. Insulin -dependent DM  - A1c was 10.8% - Continue long- and short-acting insulin    -Noted to have some hypoglycemia this a.m. consulted diabetes coordinator.   5. Hypertension  - Coreg     6. Bipolar disorder  - Continue Zyprexa , Lamictal       DVT prophylaxis:Heparin  Code Status: Full Family Communication: None at bedside Disposition  Plan:  Status is: Inpatient Remains inpatient appropriate because: Need for IV medications.   Consultants:  Nephrology  Procedures:  None  Antimicrobials:  None  Subjective: Patient seen and evaluated today and was somnolent, but arousable.  Complaining of abdominal pain this morning.  No nausea or vomiting.  Objective: Vitals:   12/20/23 1030 12/20/23 1100 12/20/23 1115 12/20/23 1130  BP: (!) 141/85 (!) 141/84 (!) 147/88 (!) 151/84  Pulse: 79 79 80 79  Resp: 11 13 13 14   Temp:      TempSrc:      SpO2: 100% 100% 100% 100%  Weight:      Height:        Intake/Output Summary (Last 24 hours) at 12/20/2023 1204 Last data filed at 12/20/2023 0830 Gross per 24 hour  Intake 33 ml  Output --  Net 33 ml   Filed Weights   12/20/23 0141 12/20/23 0901  Weight: 76.7 kg 74.3 kg    Examination:  General exam: Appears calm and comfortable  Respiratory system: Clear to auscultation. Respiratory effort normal. Cardiovascular system: S1 & S2 heard, RRR.  Gastrointestinal system: Abdomen is distended and mildly tense. Central nervous system: Somnolent, but arousable Extremities: No edema Skin: No significant lesions noted Psychiatry: Flat affect.    Data Reviewed: I have personally reviewed following labs and imaging studies  CBC: Recent Labs  Lab 12/13/23 2014 12/19/23 2216 12/20/23 0726  WBC 10.4 7.7 11.4*  NEUTROABS 6.4 4.4  --   HGB 8.9* 11.1* 9.9*  HCT 29.0* 36.5 32.5*  MCV 89.8 89.7 89.5  PLT 280 284 352  Basic Metabolic Panel: Recent Labs  Lab 12/13/23 2014 12/19/23 2216 12/20/23 0322  NA 140 135 138  K 3.6 4.4 4.3  CL 102 102 106  CO2 25 17* 13*  GLUCOSE 177* 424* 281*  BUN 31* 45* 49*  CREATININE 6.12* 8.45* 8.39*  CALCIUM  8.0* 8.4* 8.5*   GFR: Estimated Creatinine Clearance: 9.4 mL/min (A) (by C-G formula based on SCr of 8.39 mg/dL (H)). Liver Function Tests: Recent Labs  Lab 12/13/23 2014 12/19/23 2216  AST 44* 34  ALT 41 36  ALKPHOS  716* 715*  BILITOT 1.1 1.2  PROT 5.3* 5.5*  ALBUMIN  2.5* 2.6*   No results for input(s): LIPASE, AMYLASE in the last 168 hours. No results for input(s): AMMONIA in the last 168 hours. Coagulation Profile: No results for input(s): INR, PROTIME in the last 168 hours. Cardiac Enzymes: No results for input(s): CKTOTAL, CKMB, CKMBINDEX, TROPONINI in the last 168 hours. BNP (last 3 results) No results for input(s): PROBNP in the last 8760 hours. HbA1C: Recent Labs    12/20/23 0305  HGBA1C 10.8*   CBG: Recent Labs  Lab 12/20/23 0037 12/20/23 0149 12/20/23 0603 12/20/23 0821 12/20/23 0851  GLUCAP 355* 398* 76 34* 137*   Lipid Profile: No results for input(s): CHOL, HDL, LDLCALC, TRIG, CHOLHDL, LDLDIRECT in the last 72 hours. Thyroid  Function Tests: No results for input(s): TSH, T4TOTAL, FREET4, T3FREE, THYROIDAB in the last 72 hours. Anemia Panel: No results for input(s): VITAMINB12, FOLATE, FERRITIN, TIBC, IRON , RETICCTPCT in the last 72 hours. Sepsis Labs: No results for input(s): PROCALCITON, LATICACIDVEN in the last 168 hours.  No results found for this or any previous visit (from the past 240 hours).       Radiology Studies: DG Chest Portable 1 View Result Date: 12/19/2023 CLINICAL DATA:  Shortness of breath. EXAM: PORTABLE CHEST 1 VIEW COMPARISON:  Multiple priors, most recently 12/13/2023 FINDINGS: Chronic cardiomegaly. Mild pulmonary edema. Suspect trace pleural effusions. No confluent airspace disease. No pneumothorax. No acute osseous findings. IMPRESSION: Chronic cardiomegaly. Mild pulmonary edema and trace pleural effusions. Electronically Signed   By: Andrea Gasman M.D.   On: 12/19/2023 21:27        Scheduled Meds:  [START ON 12/21/2023] calcitRIOL   1.25 mcg Oral Q T,Th,Sat-1800   carvedilol   12.5 mg Oral BID WC   Chlorhexidine  Gluconate Cloth  6 each Topical Q0600   diphenhydrAMINE   25 mg  Intravenous Once   famotidine   20 mg Oral QAC breakfast   heparin   5,000 Units Subcutaneous Q8H   insulin  aspart  0-5 Units Subcutaneous QHS   insulin  aspart  0-6 Units Subcutaneous TID WC   insulin  glargine-yfgn  10 Units Subcutaneous Daily   lamoTRIgine   200 mg Oral Daily   lipase/protease/amylase  12,000 Units Oral TID with meals   OLANZapine  zydis  5 mg Oral QHS   sodium bicarbonate   1,300 mg Oral BID   sodium chloride  flush  3 mL Intravenous Q12H     LOS: 1 day    Time spent: 55 minutes    Feiga Nadel D Meadow Abramo, DO [facility]  If 7PM-7AM, please contact night-coverage [email or web address] 12/20/2023, 12:04 PM

## 2023-12-20 NOTE — TOC CM/SW Note (Signed)
 Transition of Care [facility]) - Inpatient Brief Assessment   Patient Details  Name: Ashley Freeman MRN: [id number] Date of Birth: 05-Nov-1990  [facility] CM/SW Contact:    Waddell Barnie Rama, RN Phone Number: 12/20/2023, 3:45 PM   Clinical Narrative: From home alone, indep, has PCP and insurance on file, states has no HH services in place at this time or DME at home.  States she willl need transport at dc.    Pta self ambulatory.  Patient hung up the phone.  There are no ICM needs identified  at this time.  Please place consult for ICM  needs.     Transition of Care Asessment: Insurance and Status: Insurance coverage has been reviewed Patient has primary care physician: Yes Home environment has been reviewed: home alone Prior level of function:: indep Prior/Current Home Services: No current home services Social Drivers of Health Review: SDOH reviewed no interventions necessary Readmission risk has been reviewed: Yes Transition of care needs: no transition of care needs at this time

## 2023-12-20 NOTE — Progress Notes (Addendum)
 Received patient in bed to unit.  Alert and oriented.  Informed consent signed and in chart.   TX duration:3.5 hours  Patient tolerated well.  Transported back to the room  Alert, without acute distress.  Hand-off given to patient's nurse.   Access used: Right Upper Arm fistula Access issues: 100cc ns bolus for cramps  Total UF removed: 4.9L Medication(s) given: none   12/20/23 1249  Vitals  Temp 97.8 F (36.6 C)  BP (!) 153/82  Pulse Rate 81  Resp 19  Oxygen Therapy  SpO2 100 %  O2 Device Room Air  Patient Activity (if Appropriate) In bed  Pulse Oximetry Type Continuous  Oximetry Probe Site Changed No  During Treatment Monitoring  Blood Flow Rate (mL/min) 400 mL/min  Arterial Pressure (mmHg) -189.28 mmHg  Venous Pressure (mmHg) 230.09 mmHg  TMP (mmHg) 27.27 mmHg  Ultrafiltration Rate (mL/min) 1909 mL/min  Dialysate Flow Rate (mL/min) 299 ml/min  Dialysate Potassium Concentration 3  Dialysate Calcium  Concentration 2.5  Duration of HD Treatment -hour(s) 3.48 hour(s)  Cumulative Fluid Removed (mL) per Treatment  4857.46  HD Safety Checks Performed Yes  Intra-Hemodialysis Comments Tx completed;Tolerated well  Post Treatment  Dialyzer Clearance Lightly streaked  Liters Processed 84  Fluid Removed (mL) 4900 mL  Tolerated HD Treatment Yes  Post-Hemodialysis Comments Patient cramped a little during session  Fistula / Graft Right Upper arm Arteriovenous vein graft  Placement Date/Time: 07/06/22 1013   Placed prior to admission: No  Orientation: Right  Access Location: Upper arm  Access Type: Arteriovenous vein graft  Status Deaccessed     Camellia Brasil LPN [facility]

## 2023-12-20 NOTE — Progress Notes (Signed)
 Pt verbally understands discharge plan of care.   Tele and PIV removed per Elora PEAK. Patient had HD today and is waiting for hot meal before discharge lounge and taxi.  RN aware.

## 2023-12-20 NOTE — TOC Transition Note (Signed)
 Transition of Care [facility]) - Discharge Note   Patient Details  Name: Ashley Freeman MRN: [id number] Date of Birth: 01-15-1991  Transition of Care [facility]) CM/SW Contact:  Waddell Barnie Rama, RN Phone Number: 12/20/2023, 3:47 PM   Clinical Narrative:    For dc today, she may need transport assistance.          Patient Goals and CMS Choice            Discharge Placement                       Discharge Plan and Services Additional resources added to the After Visit Summary for                                       Social Drivers of Health (SDOH) Interventions SDOH Screenings   Food Insecurity: No Food Insecurity (12/20/2023)  Housing: Unknown (12/20/2023)  Transportation Needs: Unmet Transportation Needs (12/20/2023)  Utilities: Not At Risk (12/20/2023)  Depression (PHQ2-9): Medium Risk (03/02/2020)  Financial Resource Strain: Medium Risk (11/15/2023)   Received from [facility]  Physical Activity: Insufficiently Active (03/02/2022)   Received from [facility]  Social Connections: Moderately Integrated (08/28/2023)  Stress: No Stress Concern Present (03/08/2023)   Received from [facility]  Tobacco Use: Low Risk  (12/19/2023)     Readmission Risk Interventions    11/28/2023   11:49 AM 10/29/2023   10:23 AM 07/23/2023    4:25 PM  Readmission Risk Prevention Plan  Transportation Screening Complete Complete Complete  Medication Review (RN Care Manager) Complete Complete Referral to Pharmacy  PCP or Specialist appointment within 3-5 days of discharge Complete Complete Complete  HRI or Home Care Consult Complete Complete Complete  SW Recovery Care/Counseling Consult  Complete Complete  Palliative Care Screening Not Applicable Not Applicable Not Applicable  Skilled Nursing Facility Not Applicable Not Applicable Not Applicable

## 2023-12-21 LAB — HEPATITIS B SURFACE ANTIBODY, QUANTITATIVE: Hep B S AB Quant (Post): <3.5 m[IU]/mL — ABNORMAL LOW

## 2023-12-22 ENCOUNTER — Encounter (HOSPITAL_COMMUNITY): Payer: Self-pay

## 2023-12-22 ENCOUNTER — Inpatient Hospital Stay (HOSPITAL_COMMUNITY)
Admission: EM | Admit: 2023-12-22 | Discharge: 2023-12-24 | DRG: 637 | Discharge status: Against Medical Advice | Attending: Family Medicine | Admitting: Family Medicine

## 2023-12-22 ENCOUNTER — Emergency Department (HOSPITAL_COMMUNITY)

## 2023-12-22 DIAGNOSIS — E1065 Type 1 diabetes mellitus with hyperglycemia: Secondary | ICD-10-CM

## 2023-12-22 DIAGNOSIS — E8779 Other fluid overload: Secondary | ICD-10-CM | POA: Diagnosis not present

## 2023-12-22 DIAGNOSIS — K219 Gastro-esophageal reflux disease without esophagitis: Secondary | ICD-10-CM | POA: Diagnosis present

## 2023-12-22 DIAGNOSIS — R112 Nausea with vomiting, unspecified: Secondary | ICD-10-CM | POA: Diagnosis not present

## 2023-12-22 DIAGNOSIS — Z888 Allergy status to other drugs, medicaments and biological substances status: Secondary | ICD-10-CM | POA: Diagnosis not present

## 2023-12-22 DIAGNOSIS — E1010 Type 1 diabetes mellitus with ketoacidosis without coma: Secondary | ICD-10-CM | POA: Diagnosis present

## 2023-12-22 DIAGNOSIS — E785 Hyperlipidemia, unspecified: Secondary | ICD-10-CM | POA: Diagnosis present

## 2023-12-22 DIAGNOSIS — Z880 Allergy status to penicillin: Secondary | ICD-10-CM | POA: Diagnosis not present

## 2023-12-22 DIAGNOSIS — Z5329 Procedure and treatment not carried out because of patient's decision for other reasons: Secondary | ICD-10-CM | POA: Diagnosis present

## 2023-12-22 DIAGNOSIS — E1043 Type 1 diabetes mellitus with diabetic autonomic (poly)neuropathy: Secondary | ICD-10-CM | POA: Diagnosis present

## 2023-12-22 DIAGNOSIS — Z992 Dependence on renal dialysis: Secondary | ICD-10-CM | POA: Diagnosis not present

## 2023-12-22 DIAGNOSIS — Z885 Allergy status to narcotic agent status: Secondary | ICD-10-CM

## 2023-12-22 DIAGNOSIS — E8770 Fluid overload, unspecified: Secondary | ICD-10-CM | POA: Diagnosis not present

## 2023-12-22 DIAGNOSIS — Z825 Family history of asthma and other chronic lower respiratory diseases: Secondary | ICD-10-CM | POA: Diagnosis not present

## 2023-12-22 DIAGNOSIS — F319 Bipolar disorder, unspecified: Secondary | ICD-10-CM | POA: Diagnosis present

## 2023-12-22 DIAGNOSIS — I132 Hypertensive heart and chronic kidney disease with heart failure and with stage 5 chronic kidney disease, or end stage renal disease: Secondary | ICD-10-CM | POA: Diagnosis present

## 2023-12-22 DIAGNOSIS — D631 Anemia in chronic kidney disease: Secondary | ICD-10-CM | POA: Diagnosis present

## 2023-12-22 DIAGNOSIS — N186 End stage renal disease: Secondary | ICD-10-CM | POA: Diagnosis present

## 2023-12-22 DIAGNOSIS — Z833 Family history of diabetes mellitus: Secondary | ICD-10-CM

## 2023-12-22 DIAGNOSIS — Z881 Allergy status to other antibiotic agents status: Secondary | ICD-10-CM

## 2023-12-22 DIAGNOSIS — Z79899 Other long term (current) drug therapy: Secondary | ICD-10-CM | POA: Diagnosis not present

## 2023-12-22 DIAGNOSIS — E1110 Type 2 diabetes mellitus with ketoacidosis without coma: Secondary | ICD-10-CM | POA: Diagnosis present

## 2023-12-22 DIAGNOSIS — E1022 Type 1 diabetes mellitus with diabetic chronic kidney disease: Secondary | ICD-10-CM | POA: Diagnosis present

## 2023-12-22 DIAGNOSIS — I428 Other cardiomyopathies: Secondary | ICD-10-CM | POA: Diagnosis present

## 2023-12-22 DIAGNOSIS — R188 Other ascites: Secondary | ICD-10-CM | POA: Diagnosis present

## 2023-12-22 DIAGNOSIS — K3184 Gastroparesis: Secondary | ICD-10-CM | POA: Diagnosis present

## 2023-12-22 DIAGNOSIS — I5022 Chronic systolic (congestive) heart failure: Secondary | ICD-10-CM | POA: Diagnosis present

## 2023-12-22 DIAGNOSIS — E10649 Type 1 diabetes mellitus with hypoglycemia without coma: Secondary | ICD-10-CM | POA: Diagnosis not present

## 2023-12-22 DIAGNOSIS — R739 Hyperglycemia, unspecified: Principal | ICD-10-CM | POA: Diagnosis present

## 2023-12-22 DIAGNOSIS — Z794 Long term (current) use of insulin: Secondary | ICD-10-CM

## 2023-12-22 LAB — BASIC METABOLIC PANEL WITH GFR
Anion gap: 16 — ABNORMAL HIGH (ref 5–15)
Anion gap: 20 — ABNORMAL HIGH (ref 5–15)
Anion gap: 17 — ABNORMAL HIGH (ref 5–15)
BUN: 44 mg/dL — ABNORMAL HIGH (ref 6–20)
BUN: 46 mg/dL — ABNORMAL HIGH (ref 6–20)
BUN: 46 mg/dL — ABNORMAL HIGH (ref 6–20)
CO2: 20 mmol/L — ABNORMAL LOW (ref 22–32)
CO2: 17 mmol/L — ABNORMAL LOW (ref 22–32)
CO2: 21 mmol/L — ABNORMAL LOW (ref 22–32)
Calcium: 8.6 mg/dL — ABNORMAL LOW (ref 8.9–10.3)
Calcium: 8.6 mg/dL — ABNORMAL LOW (ref 8.9–10.3)
Calcium: 8.7 mg/dL — ABNORMAL LOW (ref 8.9–10.3)
Chloride: 92 mmol/L — ABNORMAL LOW (ref 98–111)
Chloride: 92 mmol/L — ABNORMAL LOW (ref 98–111)
Chloride: 92 mmol/L — ABNORMAL LOW (ref 98–111)
Creatinine, Ser: 7.38 mg/dL — ABNORMAL HIGH (ref 0.44–1.00)
Creatinine, Ser: 7.28 mg/dL — ABNORMAL HIGH (ref 0.44–1.00)
Creatinine, Ser: 7.51 mg/dL — ABNORMAL HIGH (ref 0.44–1.00)
GFR, Estimated: 7 mL/min — ABNORMAL LOW (ref >60)
GFR, Estimated: 7 mL/min — ABNORMAL LOW (ref >60)
GFR, Estimated: 7 mL/min — ABNORMAL LOW (ref >60)
Glucose, Bld: 580 mg/dL (ref 70–99)
Glucose, Bld: 485 mg/dL — ABNORMAL HIGH (ref 70–99)
Glucose, Bld: 454 mg/dL — ABNORMAL HIGH (ref 70–99)
Potassium: 5.1 mmol/L (ref 3.5–5.1)
Potassium: 4.8 mmol/L (ref 3.5–5.1)
Potassium: 4.6 mmol/L (ref 3.5–5.1)
Sodium: 128 mmol/L — ABNORMAL LOW (ref 135–145)
Sodium: 129 mmol/L — ABNORMAL LOW (ref 135–145)
Sodium: 130 mmol/L — ABNORMAL LOW (ref 135–145)

## 2023-12-22 LAB — CBC
HCT: 26.7 % — ABNORMAL LOW (ref 36.0–46.0)
Hemoglobin: 8.1 g/dL — ABNORMAL LOW (ref 12.0–15.0)
MCH: 27.3 pg (ref 26.0–34.0)
MCHC: 30.3 g/dL (ref 30.0–36.0)
MCV: 89.9 fL (ref 80.0–100.0)
Platelets: 324 K/uL (ref 150–400)
RBC: 2.97 MIL/uL — ABNORMAL LOW (ref 3.87–5.11)
RDW: 16.3 % — ABNORMAL HIGH (ref 11.5–15.5)
WBC: 11.6 K/uL — ABNORMAL HIGH (ref 4.0–10.5)
nRBC: 0 % (ref 0.0–0.2)

## 2023-12-22 LAB — I-STAT VENOUS BLOOD GAS, ED
Acid-base deficit: 3 mmol/L — ABNORMAL HIGH (ref 0.0–2.0)
Bicarbonate: 23.1 mmol/L (ref 20.0–28.0)
Calcium, Ion: 1.1 mmol/L — ABNORMAL LOW (ref 1.15–1.40)
HCT: 27 % — ABNORMAL LOW (ref 36.0–46.0)
Hemoglobin: 9.2 g/dL — ABNORMAL LOW (ref 12.0–15.0)
O2 Saturation: 56 %
Potassium: 4.9 mmol/L (ref 3.5–5.1)
Sodium: 127 mmol/L — ABNORMAL LOW (ref 135–145)
TCO2: 24 mmol/L (ref 22–32)
pCO2, Ven: 45.7 mmHg (ref 44–60)
pH, Ven: 7.311 (ref 7.25–7.43)
pO2, Ven: 32 mmHg (ref 32–45)

## 2023-12-22 LAB — TROPONIN I (HIGH SENSITIVITY)
Troponin I (High Sensitivity): 17 ng/L (ref <18)
Troponin I (High Sensitivity): 16 ng/L (ref <18)

## 2023-12-22 LAB — CBC WITH DIFFERENTIAL/PLATELET
Abs Granulocyte: 6.1 K/uL (ref 1.5–6.5)
Abs Immature Granulocytes: 0.11 K/uL — ABNORMAL HIGH (ref 0.00–0.07)
Basophils Absolute: 0.1 K/uL (ref 0.0–0.1)
Basophils Absolute: 0.2 K/uL — ABNORMAL HIGH (ref 0.0–0.1)
Basophils Relative: 1 %
Basophils Relative: 2 %
Eosinophils Absolute: 0.6 K/uL — ABNORMAL HIGH (ref 0.0–0.5)
Eosinophils Absolute: 0.7 K/uL — ABNORMAL HIGH (ref 0.0–0.5)
Eosinophils Relative: 6 %
Eosinophils Relative: 6 %
HCT: 27.3 % — ABNORMAL LOW (ref 36.0–46.0)
HCT: 27.4 % — ABNORMAL LOW (ref 36.0–46.0)
Hemoglobin: 8.3 g/dL — ABNORMAL LOW (ref 12.0–15.0)
Hemoglobin: 8.5 g/dL — ABNORMAL LOW (ref 12.0–15.0)
Immature Granulocytes: 1 %
Lymphocytes Relative: 24 %
Lymphocytes Relative: 23 %
Lymphs Abs: 2.6 K/uL (ref 0.7–4.0)
Lymphs Abs: 2.7 K/uL (ref 0.7–4.0)
MCH: 27.5 pg (ref 26.0–34.0)
MCH: 27.3 pg (ref 26.0–34.0)
MCHC: 30.4 g/dL (ref 30.0–36.0)
MCHC: 31 g/dL (ref 30.0–36.0)
MCV: 90.4 fL (ref 80.0–100.0)
MCV: 88.1 fL (ref 80.0–100.0)
Monocytes Absolute: 1.2 K/uL — ABNORMAL HIGH (ref 0.1–1.0)
Monocytes Absolute: 0.7 K/uL (ref 0.1–1.0)
Monocytes Relative: 12 %
Monocytes Relative: 6 %
Neutro Abs: 6.1 K/uL (ref 1.7–7.7)
Neutro Abs: 7.4 K/uL (ref 1.7–7.7)
Neutrophils Relative %: 56 %
Neutrophils Relative %: 63 %
Platelets: 291 K/uL (ref 150–400)
Platelets: 317 K/uL (ref 150–400)
RBC: 3.02 MIL/uL — ABNORMAL LOW (ref 3.87–5.11)
RBC: 3.11 MIL/uL — ABNORMAL LOW (ref 3.87–5.11)
RDW: 16.4 % — ABNORMAL HIGH (ref 11.5–15.5)
RDW: 16.2 % — ABNORMAL HIGH (ref 11.5–15.5)
Smear Review: NORMAL
WBC: 10.7 K/uL — ABNORMAL HIGH (ref 4.0–10.5)
WBC: 11.7 K/uL — ABNORMAL HIGH (ref 4.0–10.5)
nRBC: 0 % (ref 0.0–0.2)
nRBC: 0 % (ref 0.0–0.2)

## 2023-12-22 LAB — COMPREHENSIVE METABOLIC PANEL WITH GFR
ALT: 28 U/L (ref 0–44)
AST: 19 U/L (ref 15–41)
Albumin: 2.5 g/dL — ABNORMAL LOW (ref 3.5–5.0)
Alkaline Phosphatase: 626 U/L — ABNORMAL HIGH (ref 38–126)
Anion gap: 16 — ABNORMAL HIGH (ref 5–15)
BUN: 46 mg/dL — ABNORMAL HIGH (ref 6–20)
CO2: 18 mmol/L — ABNORMAL LOW (ref 22–32)
Calcium: 8.6 mg/dL — ABNORMAL LOW (ref 8.9–10.3)
Chloride: 94 mmol/L — ABNORMAL LOW (ref 98–111)
Creatinine, Ser: 7.33 mg/dL — ABNORMAL HIGH (ref 0.44–1.00)
GFR, Estimated: 7 mL/min — ABNORMAL LOW (ref >60)
Glucose, Bld: 616 mg/dL (ref 70–99)
Potassium: 4.9 mmol/L (ref 3.5–5.1)
Sodium: 128 mmol/L — ABNORMAL LOW (ref 135–145)
Total Bilirubin: 1 mg/dL (ref 0.0–1.2)
Total Protein: 5.6 g/dL — ABNORMAL LOW (ref 6.5–8.1)

## 2023-12-22 LAB — CBG MONITORING, ED
Glucose-Capillary: >600 mg/dL (ref 70–99)
Glucose-Capillary: 545 mg/dL (ref 70–99)
Glucose-Capillary: 528 mg/dL (ref 70–99)

## 2023-12-22 LAB — GLUCOSE, CAPILLARY
Glucose-Capillary: 416 mg/dL — ABNORMAL HIGH (ref 70–99)
Glucose-Capillary: 385 mg/dL — ABNORMAL HIGH (ref 70–99)
Glucose-Capillary: 332 mg/dL — ABNORMAL HIGH (ref 70–99)
Glucose-Capillary: 238 mg/dL — ABNORMAL HIGH (ref 70–99)

## 2023-12-22 LAB — I-STAT CHEM 8, ED
BUN: 50 mg/dL — ABNORMAL HIGH (ref 6–20)
Calcium, Ion: 1 mmol/L — ABNORMAL LOW (ref 1.15–1.40)
Chloride: 94 mmol/L — ABNORMAL LOW (ref 98–111)
Creatinine, Ser: 7.1 mg/dL — ABNORMAL HIGH (ref 0.44–1.00)
Glucose, Bld: 607 mg/dL (ref 70–99)
HCT: 28 % — ABNORMAL LOW (ref 36.0–46.0)
Hemoglobin: 9.5 g/dL — ABNORMAL LOW (ref 12.0–15.0)
Potassium: 4.9 mmol/L (ref 3.5–5.1)
Sodium: 126 mmol/L — ABNORMAL LOW (ref 135–145)
TCO2: 20 mmol/L — ABNORMAL LOW (ref 22–32)

## 2023-12-22 LAB — I-STAT CG4 LACTIC ACID, ED: Lactic Acid, Venous: 2.7 mmol/L (ref 0.5–1.9)

## 2023-12-22 LAB — BETA-HYDROXYBUTYRIC ACID: Beta-Hydroxybutyric Acid: 0.1 mmol/L (ref 0.05–0.27)

## 2023-12-22 LAB — HCG, SERUM, QUALITATIVE
Preg, Serum: NEGATIVE
Preg, Serum: NEGATIVE

## 2023-12-22 MED ORDER — INSULIN REGULAR(HUMAN) IN NACL 100-0.9 UT/100ML-% IV SOLN
INTRAVENOUS | Status: DC
  Administered 2023-12-22: 7.5 [IU]/h via INTRAVENOUS
  Filled 2023-12-22: qty 100

## 2023-12-22 MED ORDER — SODIUM CHLORIDE 0.9 % IV SOLN: Freq: Once | INTRAVENOUS | Status: AC

## 2023-12-22 MED ORDER — LOSARTAN POTASSIUM 50 MG PO TABS
100.0000 mg | ORAL_TABLET | Freq: Every day | ORAL | Status: DC
  Administered 2023-12-22 – 2023-12-24 (×3): 100 mg via ORAL
  Filled 2023-12-22 (×3): qty 2

## 2023-12-22 MED ORDER — SODIUM BICARBONATE 650 MG PO TABS
1300.0000 mg | ORAL_TABLET | Freq: Two times a day (BID) | ORAL | Status: DC
  Administered 2023-12-22 – 2023-12-24 (×4): 1300 mg via ORAL
  Filled 2023-12-22 (×4): qty 2

## 2023-12-22 MED ORDER — CALCITRIOL 0.5 MCG PO CAPS
1.2500 ug | ORAL_CAPSULE | ORAL | Status: DC
  Filled 2023-12-22: qty 1

## 2023-12-22 MED ORDER — DEXTROSE IN LACTATED RINGERS 5 % IV SOLN: INTRAVENOUS | Status: DC

## 2023-12-22 MED ORDER — CARVEDILOL 25 MG PO TABS
25.0000 mg | ORAL_TABLET | Freq: Two times a day (BID) | ORAL | Status: DC
  Administered 2023-12-23 – 2023-12-24 (×2): 25 mg via ORAL
  Filled 2023-12-22 (×2): qty 1

## 2023-12-22 MED ORDER — LAMOTRIGINE 100 MG PO TABS
200.0000 mg | ORAL_TABLET | Freq: Every day | ORAL | Status: DC
  Administered 2023-12-22 – 2023-12-24 (×3): 200 mg via ORAL
  Filled 2023-12-22: qty 2
  Filled 2023-12-22: qty 8
  Filled 2023-12-22: qty 2

## 2023-12-22 MED ORDER — INSULIN GLARGINE-YFGN 100 UNIT/ML ~~LOC~~ SOLN: 10.0000 [IU] | Freq: Every day | SUBCUTANEOUS | Status: DC

## 2023-12-22 MED ORDER — PANTOPRAZOLE SODIUM 40 MG PO TBEC
40.0000 mg | DELAYED_RELEASE_TABLET | Freq: Every day | ORAL | Status: DC
  Administered 2023-12-22 – 2023-12-24 (×3): 40 mg via ORAL
  Filled 2023-12-22 (×3): qty 1

## 2023-12-22 MED ORDER — HYDROMORPHONE HCL 1 MG/ML IJ SOLN
0.5000 mg | INTRAMUSCULAR | Status: DC | PRN
  Administered 2023-12-22 – 2023-12-24 (×9): 0.5 mg via INTRAVENOUS
  Filled 2023-12-22 (×5): qty 0.5
  Filled 2023-12-22: qty 1
  Filled 2023-12-22 (×3): qty 0.5

## 2023-12-22 MED ORDER — CHLORHEXIDINE GLUCONATE CLOTH 2 % EX PADS
6.0000 | MEDICATED_PAD | Freq: Every day | CUTANEOUS | Status: DC
  Administered 2023-12-23: 6 via TOPICAL

## 2023-12-22 MED ORDER — LACTATED RINGERS IV SOLN: INTRAVENOUS | Status: DC

## 2023-12-22 MED ORDER — OLANZAPINE 5 MG PO TBDP
5.0000 mg | ORAL_TABLET | Freq: Every day | ORAL | Status: DC
  Administered 2023-12-22 – 2023-12-23 (×2): 5 mg via ORAL
  Filled 2023-12-22 (×2): qty 1

## 2023-12-22 MED ORDER — HYDROMORPHONE HCL 2 MG PO TABS
2.0000 mg | ORAL_TABLET | Freq: Four times a day (QID) | ORAL | Status: DC | PRN
  Administered 2023-12-22: 2 mg via ORAL
  Filled 2023-12-22: qty 1

## 2023-12-22 MED ORDER — FENTANYL CITRATE PF 50 MCG/ML IJ SOSY
50.0000 ug | PREFILLED_SYRINGE | Freq: Once | INTRAMUSCULAR | Status: AC
  Administered 2023-12-22: 50 ug via INTRAVENOUS
  Filled 2023-12-22: qty 1

## 2023-12-22 MED ORDER — HEPARIN SODIUM (PORCINE) 5000 UNIT/ML IJ SOLN
5000.0000 [IU] | Freq: Three times a day (TID) | INTRAMUSCULAR | Status: DC
  Administered 2023-12-22 – 2023-12-24 (×7): 5000 [IU] via SUBCUTANEOUS
  Filled 2023-12-22 (×7): qty 1

## 2023-12-22 MED ORDER — INSULIN GLARGINE 100 UNIT/ML ~~LOC~~ SOLN
10.0000 [IU] | Freq: Every day | SUBCUTANEOUS | Status: DC
  Administered 2023-12-22: 10 [IU] via SUBCUTANEOUS
  Filled 2023-12-22: qty 0.1

## 2023-12-22 MED ORDER — IPRATROPIUM-ALBUTEROL 0.5-2.5 (3) MG/3ML IN SOLN: 3.0000 mL | Freq: Four times a day (QID) | RESPIRATORY_TRACT | Status: DC | PRN

## 2023-12-22 MED ORDER — PANCRELIPASE (LIP-PROT-AMYL) 3000-9500 UNITS PO CPEP: ORAL_CAPSULE | Freq: Two times a day (BID) | ORAL | Status: DC

## 2023-12-22 MED ORDER — DEXTROSE 50 % IV SOLN
0.0000 mL | INTRAVENOUS | Status: DC | PRN
  Administered 2023-12-23 (×3): 50 mL via INTRAVENOUS
  Filled 2023-12-22 (×3): qty 50

## 2023-12-22 MED ORDER — INSULIN REGULAR(HUMAN) IN NACL 100-0.9 UT/100ML-% IV SOLN: INTRAVENOUS | Status: DC

## 2023-12-22 MED ORDER — DULOXETINE HCL 20 MG PO CPEP
20.0000 mg | ORAL_CAPSULE | Freq: Two times a day (BID) | ORAL | Status: DC
  Administered 2023-12-22 – 2023-12-24 (×3): 20 mg via ORAL
  Filled 2023-12-22 (×6): qty 1

## 2023-12-22 MED ORDER — ONDANSETRON 4 MG PO TBDP
4.0000 mg | ORAL_TABLET | Freq: Once | ORAL | Status: AC
  Administered 2023-12-22: 4 mg via ORAL
  Filled 2023-12-22: qty 1

## 2023-12-22 MED ORDER — ONDANSETRON HCL 4 MG/2ML IJ SOLN: 4.0000 mg | Freq: Once | INTRAMUSCULAR | Status: DC

## 2023-12-22 NOTE — ED Notes (Signed)
 Pt requested bedside commode due to pain and not being able to ambulate to restroom

## 2023-12-22 NOTE — Consult Note (Addendum)
 Renal Service Consult Note [facility] Ashley JONETTA Fret, MD  Patient: Ashley Freeman Date: 12/22/2023 Requesting Physician: Dr. MICAEL Ada  Reason for Consult: ESRD patient with nausea and abd pain  HPI: The patient is a 33 y.o. year-old w/ PMH as below who presented to ED this morning c/o lethargy, abd pain, N/V and SOB. She is on HD TTS in [place] w/ [facility]. Last HD Sat (had also HD here on Friday prior to d/c home). HD is TTS. Gets paracentesis done regularly for abd swelling. In ED 148/ 93, HR 88, RR 12- 22, 100% on RA. Na 127, BUN 44, creat 7.3.  alb 2.5, glu 607, LA 2.7, WBC 11K, Hb 8.1- 9.5.  Abd CT showed moderate ascites and anasarca, progressed since the prior CT. No bowel obstruction. Normal appendix. Patient was admitted for uncont DM, vol overload. We are asked to see for ESRD.   Pt seen in ED room. Pt is lethargic but arousable. No SOB or CP.   ROS - denies CP, no joint pain, no HA, no blurry vision, no rash, no diarrhea, no nausea/ vomiting   Past Medical History  Past Medical History:  Diagnosis Date   Abscess, gluteal, right 08/24/2013   Anemia 02/19/2012   Bartholin's gland abscess 09/19/2013   Bipolar disorder (HCC)    BV (bacterial vaginosis) 11/24/2015   Depression    Diabetes mellitus type I (HCC) 2001   Diagnosed at age 56 ; Type I   Diarrhea 05/30/2016   DKA (diabetic ketoacidoses) 08/19/2013   Also in 2018   ESRD (end stage renal disease) (HCC)    Gonorrhea 08/2011   Treated in 09/2011   HFrEF (heart failure with reduced ejection fraction) (HCC)    a. 2022 Echo: EF 40%; b. 10/2021 Echo: EF 55%; b. 07/2022 MV: No ischemia. EF 31%; c. 08/2022 Echo: EF 35%, mildly dil RV, sev TR.   History of trichomoniasis 05/31/2016   Hyperlipidemia 03/28/2016   Hypertension    NICM (nonischemic cardiomyopathy) (HCC)    Sepsis (HCC) 09/19/2013   Past Surgical History  Past Surgical History:  Procedure Laterality Date   A/V FISTULAGRAM Right  06/17/2023   Procedure: A/V Fistulagram;  Surgeon: Marea Selinda GORMAN, MD;  Location: [facility] INVASIVE CV LAB;  Service: Cardiovascular;  Laterality: Right;   A/V SHUNT INTERVENTION N/A 09/25/2023   Procedure: A/V SHUNT INTERVENTION;  Surgeon: Pearline Norman GORMAN, MD;  Location: [facility] PV LAB;  Service: Cardiovascular;  Laterality: N/A;   AV FISTULA PLACEMENT Right 07/06/2022   Procedure: ARTERIOVENOUS GRAFT CREATION;  Surgeon: Gretta Lonni PARAS, MD;  Location: [facility] OR;  Service: Vascular;  Laterality: Right;   CESAREAN SECTION N/A 10/05/2019   Procedure: CESAREAN SECTION;  Surgeon: Izell Harari, MD;  Location: [facility] LD ORS;  Service: Obstetrics;  Laterality: N/A;   CHOLECYSTECTOMY N/A 07/02/2023   Procedure: LAPAROSCOPIC CHOLECYSTECTOMY;  Surgeon: Ebbie Cough, MD;  Location: [facility] OR;  Service: General;  Laterality: N/A;   INCISION AND DRAINAGE ABSCESS Left 09/28/2019   Procedure: INCISION AND DRAINAGE VULVAR ABCESS;  Surgeon: Edsel Norleen GAILS, MD;  Location: [facility] OR;  Service: Gynecology;  Laterality: Left;   INCISION AND DRAINAGE PERIRECTAL ABSCESS Right 08/18/2013   Procedure: IRRIGATION AND DEBRIDEMENT GLUTEAL ABSCESS;  Surgeon: Lynda Leos, MD;  Location: [facility] OR;  Service: General;  Laterality: Right;   INCISION AND DRAINAGE PERIRECTAL ABSCESS Right 09/19/2013   Procedure: IRRIGATION AND DEBRIDEMENT RIGHT GLUTEAL AND LABIAL ABSCESSES;  Surgeon: Lynda Leos, MD;  Location: [facility] OR;  Service: General;  Laterality: Right;   INCISION AND DRAINAGE PERIRECTAL ABSCESS Right 09/24/2013   Procedure: IRRIGATION AND DEBRIDEMENT PERIRECTAL ABSCESS;  Surgeon: Lynwood MALVA Pina, MD;  Location: [facility] OR;  Service: General;  Laterality: Right;   IR PARACENTESIS  08/28/2023   IR PARACENTESIS  11/04/2023   Family History  Family History  Problem Relation Age of Onset   Asthma Mother    Carpal tunnel syndrome Mother    Gout Father    Diabetes Paternal Grandmother    Anesthesia problems Neg Hx    Social History   reports that she has never smoked. She has never been exposed to tobacco smoke. She has never used smokeless tobacco. She reports that she does not currently use alcohol. She reports that she does not use drugs. Allergies  Allergies  Allergen Reactions   Cephalexin  Anaphylaxis and Other (See Comments)    Has gotten ceftriaxone  in the past    Doxycycline  Anaphylaxis   Penicillins Anaphylaxis, Hives and Rash    Per patient her reaction is anaphylactic.    Benadryl  [Diphenhydramine ] Itching   Dilaudid  [Hydromorphone ] Itching   Methotrexate Derivatives Rash   Oxycodone  Itching   Home medications Prior to Admission medications   Medication Sig Start Date End Date Taking? Authorizing Provider  albuterol  (VENTOLIN  HFA) 108 (90 Base) MCG/ACT inhaler Inhale 2 puffs into the lungs every 4 (four) hours as needed for wheezing or shortness of breath. 11/24/22   [provider]  atorvastatin  (LIPITOR ) 80 MG tablet Take 1 tablet (80 mg total) by mouth daily. Patient not taking: Reported on 11/26/2023 10/15/23   Jonel Lonni SQUIBB, MD  bumetanide  (BUMEX ) 2 MG tablet Take 10 mg by mouth daily.    [provider]  calcitRIOL  (ROCALTROL ) 0.25 MCG capsule Take 5 capsules (1.25 mcg total) by mouth every Tuesday, Thursday, and Saturday at 6 PM. Patient taking differently: Take 1.25 mcg by mouth every Monday, Wednesday, and Friday. 07/09/23   Cindy Garnette POUR, MD  carvedilol  (COREG ) 25 MG tablet Take 25 mg by mouth 2 (two) times daily with a meal.    [provider]  dicyclomine  (BENTYL ) 20 MG tablet Take 20 mg by mouth 2 (two) times daily as needed for spasms. Patient not taking: Reported on 12/20/2023    [provider]  DULoxetine  (CYMBALTA ) 20 MG capsule Take 20 mg by mouth 2 (two) times daily. 07/29/23   [provider]  famotidine  (PEPCID ) 20 MG tablet Take 20 mg by mouth daily before breakfast. 07/29/23   [provider]  fluticasone  (FLONASE ) 50 MCG/ACT  nasal spray Place 2 sprays into both nostrils daily. 12/11/23 12/10/24  [provider]  Glucagon  (BAQSIMI  ONE PACK) 3 MG/DOSE POWD 3 mg. 12/09/23   [provider]  HYDROmorphone  (DILAUDID ) 2 MG tablet Take 2 mg by mouth every 4 (four) hours as needed for severe pain (pain score 7-10).    [provider]  hydrOXYzine  (ATARAX ) 25 MG tablet Take 25 mg by mouth 3 (three) times daily as needed for anxiety, itching, nausea or vomiting.    [provider]  insulin  aspart (NOVOLOG ) 100 UNIT/ML injection Inject 5 Units into the skin 3 (three) times daily with meals.    [provider]  lamoTRIgine  (LAMICTAL ) 200 MG tablet Take 1 tablet (200 mg total) by mouth daily. 10/29/23   Regalado, Belkys A, MD  LANTUS  SOLOSTAR 100 UNIT/ML Solostar Pen Inject 10 Units into the skin in the morning.    [provider]  losartan  (COZAAR ) 100 MG tablet Take 100 mg by mouth daily. 03/25/23   [provider]  OLANZapine  zydis (ZYPREXA ) 5 MG disintegrating tablet Take 5 mg by mouth at bedtime. 09/23/23 12/22/23  [provider]  omeprazole  (PRILOSEC) 40 MG capsule Take 40 mg by mouth daily. 12/11/23 02/09/24  [provider]  ondansetron  (ZOFRAN -ODT) 4 MG disintegrating tablet Take 1 tablet (4 mg total) by mouth every 8 (eight) hours as needed for nausea or vomiting. 06/23/23   Davis, Jonathon H, MD  Pancrelipase , Lip-Prot-Amyl, 3000-9500 units CPEP Take 3,000 units of lipase by mouth 2 (two) times daily with a meal. 09/23/23 12/22/23  [provider]  sodium bicarbonate  650 MG tablet Take 1,300 mg by mouth 2 (two) times daily. 07/29/23   [provider]  SUMAtriptan  (IMITREX ) 50 MG tablet Take 50 mg by mouth every 2 (two) hours as needed for migraine or headache. 06/20/22   [provider]     Vitals:   12/22/23 1005 12/22/23 1007  BP: (!) 147/93 (!) 147/93  Pulse:  88  Resp: (!) 34 (!) 22  Temp: 97.7 F (36.5 C)   TempSrc:  Oral   SpO2:  100%  Weight: 85.1 kg   Height: 5' 3 (1.6 m)    Exam Gen alert, no distress, on RA No rash, cyanosis or gangrene Sclera anicteric, throat clear  No jvd or bruits Chest clear bilat to bases, no rales/ wheezing RRR no MRG Abd distended, 2+ ascites, nontender, +bs GU deferred MS no joint effusions or deformity Ext 1-2+ bilat pretib and hip edema Neuro is alert, Ox 3 , nf    RUA AVF  Home bp meds: Bumex  10mg  every day Coreg  25 bid Losartan  100mg  every day    OP HD: TTS [facility] [address] from 11/09/23 -> 3h  60kg  B350   AVG  Hep 1600 + 600 u/hr    Assessment/ Plan: Uncont DM1: w/ BS > 600. Per pmd.  ESRD: on HD TTS. Had HD here 8/22, and says she went to her OP unit on Sat 8/23. Pt is vol overloaded, but not in distress. Plan HD for tomorrow.  HTN: bp's up a bit, cont home meds prn per pmd.  Volume: sig ascites, bilat LE edema as well, no resp issues Anemia of esrd: Hb 9-12 here, follow, transfuse prn .  Ascites: might need a paracentesis while here    Myer Fret  MD [facility] 12/22/2023, 2:03 PM  Recent Labs  Lab 12/19/23 2216 12/20/23 0322 12/22/23 1029 12/22/23 1036 12/22/23 1156 12/22/23 1235  HGB 11.1*   < > 8.3* 9.5* 9.2* 8.1*  ALBUMIN  2.6*  --  2.5*  --   --   --   CALCIUM  8.4*   < > 8.6*  --   --  8.6*  CREATININE 8.45*   < > 7.33* 7.10*  --  7.38*  K 4.4   < > 4.9 4.9 4.9 5.1   < > = values in this interval not displayed.   Inpatient medications:  [START ON 12/23/2023] calcitRIOL   1.25 mcg Oral Q M,W,F   carvedilol   25 mg Oral BID WC   DULoxetine   20 mg Oral BID   heparin   5,000 Units Subcutaneous Q8H   insulin  glargine-yfgn  10 Units Subcutaneous QHS   lamoTRIgine   200 mg Oral Daily   losartan   100 mg Oral Daily   OLANZapine  zydis  5 mg Oral QHS   Pancrelipase  (Lip-Prot-Amyl)   Oral  BID WC   pantoprazole   40 mg Oral Daily   sodium bicarbonate   1,300 mg Oral BID    HYDROmorphone 

## 2023-12-22 NOTE — ED Notes (Addendum)
 PO meds held due to pt vomiting. Secure chat sent to MD requesting prn nausea meds.

## 2023-12-22 NOTE — ED Provider Notes (Signed)
 Patient had pregnancy test a few days ago that was negative.  Will pursue CT scan without getting new pregnancy test.   Ruthe Cornet, DO 12/22/23 1115

## 2023-12-22 NOTE — ED Triage Notes (Signed)
 Pt bib EMS for hyperglycemia. Pt complains of SHOB, abdominal pain, N/V. She is lethargic and tachypneic. Hx CHF, dialysis (M, W, F)) Pt denies missing any dialysis. DM. Last insulin  and last PO was last night. Reading HI on CBG for EMS.   Paracentesis done last month for abdominal swelling. Abdomen is distended.  EMS vitals: 130 systolic, 90, 98%, 35 RR

## 2023-12-22 NOTE — ED Notes (Signed)
 Pt to CT

## 2023-12-22 NOTE — H&P (Signed)
 History and Physical    Patient: Ashley Freeman FMW:[id number] DOB: 05/15/1990 DOA: 12/22/2023 DOS: the patient was seen and examined on 12/22/2023 PCP: Keven Crumbly Pap, MD  Patient coming from: Home  Chief Complaint:  Chief Complaint  Patient presents with   Hyperglycemia   HPI: Ashley Freeman is a 33 y.o. female with medical history significant of ESRD HD TTS, HFrEF (EF 30-35% in 08/2023), DM1, bipolar disorder, and frequent admission for DKA/volume overload p/w hyperglycemia and SOB iso volume overload 2/2 ADHF and ESRD.   Pt is a limited historian. From what I can gather per brief interview, pt reported difficulty breathing last night (unclear of the time), and was unable to sleep because the SOB worsened; as such, she activated EMS and was BIBA to ED for evaluation sometime early this morning. Of note, pt was recently discharged from [facility] on 8/22 after HD for similar presentation.  In the ED, pt hypertensive. Labs notable for glucose >600, VBG 7.3/45, Na 128, K 5.1, Cr 7.38, anion gap 16, lactic acid 2.7, and WBC 11.6. EDP requested medicine admission.  Review of Systems: As mentioned in the history of present illness. All other systems reviewed and are negative. Past Medical History:  Diagnosis Date   Abscess, gluteal, right 08/24/2013   Anemia 02/19/2012   Bartholin's gland abscess 09/19/2013   Bipolar disorder (HCC)    BV (bacterial vaginosis) 11/24/2015   Depression    Diabetes mellitus type I (HCC) 2001   Diagnosed at age 55 ; Type I   Diarrhea 05/30/2016   DKA (diabetic ketoacidoses) 08/19/2013   Also in 2018   ESRD (end stage renal disease) (HCC)    Gonorrhea 08/2011   Treated in 09/2011   HFrEF (heart failure with reduced ejection fraction) (HCC)    a. 2022 Echo: EF 40%; b. 10/2021 Echo: EF 55%; b. 07/2022 MV: No ischemia. EF 31%; c. 08/2022 Echo: EF 35%, mildly dil RV, sev TR.   History of trichomoniasis 05/31/2016   Hyperlipidemia  03/28/2016   Hypertension    NICM (nonischemic cardiomyopathy) (HCC)    Sepsis (HCC) 09/19/2013   Past Surgical History:  Procedure Laterality Date   A/V FISTULAGRAM Right 06/17/2023   Procedure: A/V Fistulagram;  Surgeon: Marea Selinda GORMAN, MD;  Location: [facility] INVASIVE CV LAB;  Service: Cardiovascular;  Laterality: Right;   A/V SHUNT INTERVENTION N/A 09/25/2023   Procedure: A/V SHUNT INTERVENTION;  Surgeon: Pearline Norman GORMAN, MD;  Location: [facility] PV LAB;  Service: Cardiovascular;  Laterality: N/A;   AV FISTULA PLACEMENT Right 07/06/2022   Procedure: ARTERIOVENOUS GRAFT CREATION;  Surgeon: Gretta Lonni PARAS, MD;  Location: [facility] OR;  Service: Vascular;  Laterality: Right;   CESAREAN SECTION N/A 10/05/2019   Procedure: CESAREAN SECTION;  Surgeon: Izell Harari, MD;  Location: [facility] LD ORS;  Service: Obstetrics;  Laterality: N/A;   CHOLECYSTECTOMY N/A 07/02/2023   Procedure: LAPAROSCOPIC CHOLECYSTECTOMY;  Surgeon: Ebbie Cough, MD;  Location: [facility] OR;  Service: General;  Laterality: N/A;   INCISION AND DRAINAGE ABSCESS Left 09/28/2019   Procedure: INCISION AND DRAINAGE VULVAR ABCESS;  Surgeon: Edsel Norleen GAILS, MD;  Location: [facility] OR;  Service: Gynecology;  Laterality: Left;   INCISION AND DRAINAGE PERIRECTAL ABSCESS Right 08/18/2013   Procedure: IRRIGATION AND DEBRIDEMENT GLUTEAL ABSCESS;  Surgeon: Lynda Leos, MD;  Location: [facility] OR;  Service: General;  Laterality: Right;   INCISION AND DRAINAGE PERIRECTAL ABSCESS Right 09/19/2013   Procedure: IRRIGATION AND DEBRIDEMENT RIGHT GLUTEAL AND LABIAL ABSCESSES;  Surgeon: Lynda Leos, MD;  Location: [facility] OR;  Service: General;  Laterality: Right;   INCISION AND DRAINAGE PERIRECTAL ABSCESS Right 09/24/2013   Procedure: IRRIGATION AND DEBRIDEMENT PERIRECTAL ABSCESS;  Surgeon: Lynwood MALVA Pina, MD;  Location: [facility] OR;  Service: General;  Laterality: Right;   IR PARACENTESIS  08/28/2023   IR PARACENTESIS  11/04/2023   Social History:  reports that she has  never smoked. She has never been exposed to tobacco smoke. She has never used smokeless tobacco. She reports that she does not currently use alcohol. She reports that she does not use drugs.  Allergies  Allergen Reactions   Cephalexin  Anaphylaxis and Other (See Comments)    Has gotten ceftriaxone  in the past    Doxycycline  Anaphylaxis   Penicillins Anaphylaxis, Hives and Rash    Per patient her reaction is anaphylactic.    Benadryl  [Diphenhydramine ] Itching   Dilaudid  [Hydromorphone ] Itching   Methotrexate Derivatives Rash   Oxycodone  Itching    Family History  Problem Relation Age of Onset   Asthma Mother    Carpal tunnel syndrome Mother    Gout Father    Diabetes Paternal Grandmother    Anesthesia problems Neg Hx     Prior to Admission medications   Medication Sig Start Date End Date Taking? Authorizing Provider  albuterol  (VENTOLIN  HFA) 108 (90 Base) MCG/ACT inhaler Inhale 2 puffs into the lungs every 4 (four) hours as needed for wheezing or shortness of breath. 11/24/22   [provider]  atorvastatin  (LIPITOR ) 80 MG tablet Take 1 tablet (80 mg total) by mouth daily. Patient not taking: Reported on 11/26/2023 10/15/23   Jonel Lonni SQUIBB, MD  bumetanide  (BUMEX ) 2 MG tablet Take 10 mg by mouth daily.    [provider]  calcitRIOL  (ROCALTROL ) 0.25 MCG capsule Take 5 capsules (1.25 mcg total) by mouth every Tuesday, Thursday, and Saturday at 6 PM. Patient taking differently: Take 1.25 mcg by mouth every Monday, Wednesday, and Friday. 07/09/23   Cindy Garnette POUR, MD  carvedilol  (COREG ) 25 MG tablet Take 25 mg by mouth 2 (two) times daily with a meal.    [provider]  dicyclomine  (BENTYL ) 20 MG tablet Take 20 mg by mouth 2 (two) times daily as needed for spasms. Patient not taking: Reported on 12/20/2023    [provider]  DULoxetine  (CYMBALTA ) 20 MG capsule Take 20 mg by mouth 2 (two) times daily. 07/29/23   [provider]   famotidine  (PEPCID ) 20 MG tablet Take 20 mg by mouth daily before breakfast. 07/29/23   [provider]  fluticasone  (FLONASE ) 50 MCG/ACT nasal spray Place 2 sprays into both nostrils daily. 12/11/23 12/10/24  [provider]  Glucagon  (BAQSIMI  ONE PACK) 3 MG/DOSE POWD 3 mg. 12/09/23   [provider]  HYDROmorphone  (DILAUDID ) 2 MG tablet Take 2 mg by mouth every 4 (four) hours as needed for severe pain (pain score 7-10).    [provider]  hydrOXYzine  (ATARAX ) 25 MG tablet Take 25 mg by mouth 3 (three) times daily as needed for anxiety, itching, nausea or vomiting.    [provider]  insulin  aspart (NOVOLOG ) 100 UNIT/ML injection Inject 5 Units into the skin 3 (three) times daily with meals.    [provider]  lamoTRIgine  (LAMICTAL ) 200 MG tablet Take 1 tablet (200 mg total) by mouth daily. 10/29/23   Regalado, Belkys A, MD  LANTUS  SOLOSTAR 100 UNIT/ML Solostar Pen Inject 10 Units into the skin in the morning.    [provider]  losartan  (COZAAR ) 100 MG tablet Take 100 mg by mouth daily. 03/25/23   [provider]  OLANZapine  zydis (ZYPREXA ) 5 MG disintegrating tablet Take 5 mg by mouth at bedtime. 09/23/23 12/22/23  [provider]  omeprazole  (PRILOSEC) 40 MG capsule Take 40 mg by mouth daily. 12/11/23 02/09/24  [provider]  ondansetron  (ZOFRAN -ODT) 4 MG disintegrating tablet Take 1 tablet (4 mg total) by mouth every 8 (eight) hours as needed for nausea or vomiting. 06/23/23   Davis, Jonathon H, MD  Pancrelipase , Lip-Prot-Amyl, 3000-9500 units CPEP Take 3,000 units of lipase by mouth 2 (two) times daily with a meal. 09/23/23 12/22/23  [provider]  sodium bicarbonate  650 MG tablet Take 1,300 mg by mouth 2 (two) times daily. 07/29/23   [provider]  SUMAtriptan  (IMITREX ) 50 MG tablet Take 50 mg by mouth every 2 (two) hours as needed for migraine or headache. 06/20/22   [provider]    Physical Exam: Vitals:   12/22/23 1005 12/22/23 1007  BP: (!) 147/93 (!) 147/93  Pulse:  88  Resp: (!) 34 (!) 22  Temp: 97.7 F (36.5 C)   TempSrc: Oral   SpO2:  100%  Weight: 85.1 kg   Height: 5' 3 (1.6 m)    General: Alert, oriented x3, resting comfortably in no acute distress Cardiovascular: Regular rate and rhythm w/o m/r/g Abdomen: Soft, nontender, distended; nl bowel sounds   Data Reviewed:  Lab Results  Component Value Date   WBC 11.6 (H) 12/22/2023   HGB 8.1 (L) 12/22/2023   HCT 26.7 (L) 12/22/2023   MCV 89.9 12/22/2023   PLT 324 12/22/2023   Lab Results  Component Value Date   GLUCOSE 580 (HH) 12/22/2023   CALCIUM  8.6 (L) 12/22/2023   NA 128 (L) 12/22/2023   K 5.1 12/22/2023   CO2 20 (L) 12/22/2023   CL 92 (L) 12/22/2023   BUN 44 (H) 12/22/2023   CREATININE 7.38 (H) 12/22/2023   Lab Results  Component Value Date   ALT 28 12/22/2023   AST 19 12/22/2023   ALKPHOS 626 (H) 12/22/2023   BILITOT 1.0 12/22/2023   Lab Results  Component Value Date   INR 1.5 (H) 11/17/2023   INR 1.4 (H) 07/20/2023   INR 1.1 04/24/2021   Radiology: CT ABDOMEN PELVIS WO CONTRAST Result Date: 12/22/2023 CLINICAL DATA:  Abdominal pain EXAM: CT ABDOMEN AND PELVIS WITHOUT CONTRAST TECHNIQUE: Multidetector CT imaging of the abdomen and pelvis was performed following the standard protocol without IV contrast. RADIATION DOSE REDUCTION: This exam was performed according to the departmental dose-optimization program which includes automated exposure control, adjustment of the mA and/or kV according to patient size and/or use of iterative reconstruction technique. COMPARISON:  CT abdomen pelvis dated 11/06/2023. FINDINGS: Evaluation of this exam is limited in the absence of intravenous contrast. Lower chest: Partially visualized small right pleural effusion with partial compressive atelectasis of the right lung base. There is mild cardiomegaly. There is hypoattenuation of the  cardiac blood pool suggestive of anemia. Clinical correlation is recommended. No intra-abdominal free air.  Moderate ascites. Hepatobiliary: Mildly enlarged liver measuring 17 cm in midclavicular length. No biliary dilatation. Cholecystectomy. Pancreas: The pancreas is mildly atrophic for age. No dilatation of the main pancreatic duct Spleen: Normal in size without focal abnormality. Adrenals/Urinary Tract: The adrenal glands are unremarkable. Mild bilateral renal parenchyma atrophy. There is no hydronephrosis or nephrolithiasis on either side. The visualized ureters and urinary bladder appear unremarkable. Stomach/Bowel: There is no  bowel obstruction. The appendix is normal. Vascular/Lymphatic: The abdominal aorta and IVC are unremarkable. Atherosclerotic calcification of the mesenteric arteries. No portal venous gas. No obvious adenopathy. Reproductive: The uterus is grossly unremarkable. No suspicious adnexal masses. Other: Diffuse subcutaneous edema and anasarca. Musculoskeletal: No acute or significant osseous findings. IMPRESSION: 1. Moderate ascites and anasarca, progressed since the prior CT. 2. No bowel obstruction. Normal appendix. 3. Partially visualized small right pleural effusion with partial compressive atelectasis of the right lung base. 4. Mild cardiomegaly. Electronically Signed   By: Vanetta Chou M.D.   On: 12/22/2023 12:42   DG Chest Portable 1 View Result Date: 12/22/2023 CLINICAL DATA:  Shortness of breath.  Hyperglycemia. EXAM: PORTABLE CHEST 1 VIEW COMPARISON:  12/19/2023 FINDINGS: Heart size is mildly enlarged, likely exaggerated by low lung volumes. Mild linear opacity in the right lung base is new and consistent with mild subsegmental atelectasis. No No evidence of focal consolidation or pleural effusion. IMPRESSION: Low lung volumes with mild right basilar subsegmental atelectasis. Electronically Signed   By: Norleen DELENA Kil M.D.   On: 12/22/2023 11:12    Assessment and Plan: 53F  h/o ESRD HD TTS, HFrEF (EF 30-35% in 08/2023), DM1, bipolar disorder, and frequent admission for DKA/volume overload p/w hyperglycemia and SOB iso volume overload 2/2 ADHF and ESRD.   Volume overload 2/2 ESRD and HFrEF R pleural effusion -Renal consulted; recs: HD planned for early tomorrow AM -PTA calcitriol  and sodium bicarbonate  -Duonebs prn  Moderate ascites -IR consulted; recs: paracentesis planned for tomorrow  Hyperglycemia DM1 -Semglee  10U daily + SSI TID AC  HTN -PTA Coreg  25mg  BID  Bipolar disorder -PTA Zyprexa , duloxetine  and lamictal    Advance Care Planning:   Code Status: Full Code   Consults: Renal and IR  Family Communication: N/A  Severity of Illness: The appropriate patient status for this patient is INPATIENT. Inpatient status is judged to be reasonable and necessary in order to provide the required intensity of service to ensure the patient's safety. The patient's presenting symptoms, physical exam findings, and initial radiographic and laboratory data in the context of their chronic comorbidities is felt to place them at high risk for further clinical deterioration. Furthermore, it is not anticipated that the patient will be medically stable for discharge from the hospital within 2 midnights of admission.   * I certify that at the point of admission it is my clinical judgment that the patient will require inpatient hospital care spanning beyond 2 midnights from the point of admission due to high intensity of service, high risk for further deterioration and high frequency of surveillance required.*   ------- I spent 55 minutes reviewing previous notes, at the bedside counseling/discussing the treatment plan, and performing clinical documentation.  Author: Marsha Ada, MD 12/22/2023 1:59 PM  For on call review [email or web address].

## 2023-12-22 NOTE — Plan of Care (Signed)
   Problem: Coping: Goal: Ability to adjust to condition or change in health will improve Outcome: Progressing   Problem: Fluid Volume: Goal: Ability to maintain a balanced intake and output will improve Outcome: Progressing

## 2023-12-22 NOTE — ED Provider Notes (Signed)
 [place] EMERGENCY DEPARTMENT AT [facility] Provider Note   CSN: [id number] Arrival date & time: 12/22/23  9045     Patient presents with: Hyperglycemia   Ashley Freeman is a 33 y.o. female.   Patient here with elevated blood sugar nausea vomiting.  States she went to dialysis yesterday.  Last took her insulin  yesterday.  She has had some abdominal swelling increased as well.  Has had shortness of breath.  Denies any chest pain.  Denies any weakness numbness tingling.  No diarrhea.  She still makes urine.  She is very nauseous.  She is having abdominal discomfort at times as well.  The history is provided by the patient.       Prior to Admission medications   Medication Sig Start Date End Date Taking? Authorizing Provider  albuterol  (VENTOLIN  HFA) 108 (90 Base) MCG/ACT inhaler Inhale 2 puffs into the lungs every 4 (four) hours as needed for wheezing or shortness of breath. 11/24/22   [provider]  atorvastatin  (LIPITOR ) 80 MG tablet Take 1 tablet (80 mg total) by mouth daily. Patient not taking: Reported on 11/26/2023 10/15/23   Jonel Lonni SQUIBB, MD  bumetanide  (BUMEX ) 2 MG tablet Take 10 mg by mouth daily.    [provider]  calcitRIOL  (ROCALTROL ) 0.25 MCG capsule Take 5 capsules (1.25 mcg total) by mouth every Tuesday, Thursday, and Saturday at 6 PM. Patient taking differently: Take 1.25 mcg by mouth every Monday, Wednesday, and Friday. 07/09/23   Cindy Garnette POUR, MD  carvedilol  (COREG ) 25 MG tablet Take 25 mg by mouth 2 (two) times daily with a meal.    [provider]  dicyclomine  (BENTYL ) 20 MG tablet Take 20 mg by mouth 2 (two) times daily as needed for spasms. Patient not taking: Reported on 12/20/2023    [provider]  DULoxetine  (CYMBALTA ) 20 MG capsule Take 20 mg by mouth 2 (two) times daily. 07/29/23   [provider]  famotidine  (PEPCID ) 20 MG tablet Take 20 mg by mouth daily before breakfast.  07/29/23   [provider]  fluticasone  (FLONASE ) 50 MCG/ACT nasal spray Place 2 sprays into both nostrils daily. 12/11/23 12/10/24  [provider]  Glucagon  (BAQSIMI  ONE PACK) 3 MG/DOSE POWD 3 mg. 12/09/23   [provider]  HYDROmorphone  (DILAUDID ) 2 MG tablet Take 2 mg by mouth every 4 (four) hours as needed for severe pain (pain score 7-10).    [provider]  hydrOXYzine  (ATARAX ) 25 MG tablet Take 25 mg by mouth 3 (three) times daily as needed for anxiety, itching, nausea or vomiting.    [provider]  insulin  aspart (NOVOLOG ) 100 UNIT/ML injection Inject 5 Units into the skin 3 (three) times daily with meals.    [provider]  lamoTRIgine  (LAMICTAL ) 200 MG tablet Take 1 tablet (200 mg total) by mouth daily. 10/29/23   Regalado, Belkys A, MD  LANTUS  SOLOSTAR 100 UNIT/ML Solostar Pen Inject 10 Units into the skin in the morning.    [provider]  losartan  (COZAAR ) 100 MG tablet Take 100 mg by mouth daily. 03/25/23   [provider]  OLANZapine  zydis (ZYPREXA ) 5 MG disintegrating tablet Take 5 mg by mouth at bedtime. 09/23/23 12/22/23  [provider]  omeprazole  (PRILOSEC) 40 MG capsule Take 40 mg by mouth daily. 12/11/23 02/09/24  [provider]  ondansetron  (ZOFRAN -ODT) 4 MG disintegrating tablet Take 1 tablet (4 mg total) by mouth every 8 (eight) hours as needed  for nausea or vomiting. 06/23/23   Nicholaus Cassondra DEL, MD  Pancrelipase , Lip-Prot-Amyl, 3000-9500 units CPEP Take 3,000 units of lipase by mouth 2 (two) times daily with a meal. 09/23/23 12/22/23  [provider]  sodium bicarbonate  650 MG tablet Take 1,300 mg by mouth 2 (two) times daily. 07/29/23   [provider]  SUMAtriptan  (IMITREX ) 50 MG tablet Take 50 mg by mouth every 2 (two) hours as needed for migraine or headache. 06/20/22   [provider]    Allergies: Cephalexin , Doxycycline , Penicillins, Benadryl   [diphenhydramine ], Dilaudid  [hydromorphone ], Methotrexate derivatives, and Oxycodone     Review of Systems  Updated Vital Signs BP (!) 147/93   Pulse 88   Temp 97.7 F (36.5 C) (Oral)   Resp (!) 22   Ht 5' 3 (1.6 m)   Wt 85.1 kg Comment: bed weight  LMP  (LMP Unknown) Comment: patient states she doesnt get her period  SpO2 100%   BMI 33.23 kg/m   Physical Exam Vitals and nursing note reviewed.  Constitutional:      General: She is in acute distress.     Appearance: She is well-developed. She is ill-appearing.  HENT:     Head: Normocephalic and atraumatic.     Nose: Nose normal.     Mouth/Throat:     Mouth: Mucous membranes are dry.  Eyes:     Conjunctiva/sclera: Conjunctivae normal.     Pupils: Pupils are equal, round, and reactive to light.  Cardiovascular:     Rate and Rhythm: Normal rate and regular rhythm.     Heart sounds: No murmur heard. Pulmonary:     Effort: Pulmonary effort is normal. No respiratory distress.     Breath sounds: Normal breath sounds.  Abdominal:     General: There is distension.     Palpations: Abdomen is soft.     Tenderness: There is no abdominal tenderness.  Musculoskeletal:        General: No swelling.     Cervical back: Normal range of motion and neck supple.  Skin:    General: Skin is warm and dry.     Capillary Refill: Capillary refill takes less than 2 seconds.  Neurological:     General: No focal deficit present.     Mental Status: She is alert.  Psychiatric:        Mood and Affect: Mood normal.     (all labs ordered are listed, but only abnormal results are displayed) Labs Reviewed  CBC WITH DIFFERENTIAL/PLATELET - Abnormal; Notable for the following components:      Result Value   WBC 10.7 (*)    RBC 3.02 (*)    Hemoglobin 8.3 (*)    HCT 27.3 (*)    RDW 16.4 (*)    Monocytes Absolute 1.2 (*)    Eosinophils Absolute 0.6 (*)    Abs Immature Granulocytes 0.11 (*)    All other components within normal limits   COMPREHENSIVE METABOLIC PANEL WITH GFR - Abnormal; Notable for the following components:   Sodium 128 (*)    Chloride 94 (*)    CO2 18 (*)    Glucose, Bld 616 (*)    BUN 46 (*)    Creatinine, Ser 7.33 (*)    Calcium  8.6 (*)    Total Protein 5.6 (*)    Albumin  2.5 (*)    Alkaline Phosphatase 626 (*)    GFR, Estimated 7 (*)    Anion gap 16 (*)    All other  components within normal limits  I-STAT VENOUS BLOOD GAS, ED - Abnormal; Notable for the following components:   Acid-base deficit 3.0 (*)    Sodium 127 (*)    Calcium , Ion 1.10 (*)    HCT 27.0 (*)    Hemoglobin 9.2 (*)    All other components within normal limits  CBG MONITORING, ED - Abnormal; Notable for the following components:   Glucose-Capillary >600 (*)    All other components within normal limits  I-STAT CHEM 8, ED - Abnormal; Notable for the following components:   Sodium 126 (*)    Chloride 94 (*)    BUN 50 (*)    Creatinine, Ser 7.10 (*)    Glucose, Bld 607 (*)    Calcium , Ion 1.00 (*)    TCO2 20 (*)    Hemoglobin 9.5 (*)    HCT 28.0 (*)    All other components within normal limits  I-STAT CG4 LACTIC ACID, ED - Abnormal; Notable for the following components:   Lactic Acid, Venous 2.7 (*)    All other components within normal limits  BETA-HYDROXYBUTYRIC ACID  URINALYSIS, ROUTINE W REFLEX MICROSCOPIC  HCG, SERUM, QUALITATIVE  BASIC METABOLIC PANEL WITH GFR  BASIC METABOLIC PANEL WITH GFR  BASIC METABOLIC PANEL WITH GFR  BASIC METABOLIC PANEL WITH GFR  CBC  CBG MONITORING, ED  TROPONIN I (HIGH SENSITIVITY)  TROPONIN I (HIGH SENSITIVITY)    EKG: EKG Interpretation Date/Time:  Sunday December 22 2023 10:22:20 EDT Ventricular Rate:  88 PR Interval:  142 QRS Duration:  88 QT Interval:  399 QTC Calculation: 483 R Axis:   36  Text Interpretation: Sinus rhythm Confirmed by Ruthe Cornet [phone]) on 12/22/2023 10:24:56 AM  Radiology: [facility] Chest Portable 1 View Result Date: 12/22/2023 CLINICAL DATA:   Shortness of breath.  Hyperglycemia. EXAM: PORTABLE CHEST 1 VIEW COMPARISON:  12/19/2023 FINDINGS: Heart size is mildly enlarged, likely exaggerated by low lung volumes. Mild linear opacity in the right lung base is new and consistent with mild subsegmental atelectasis. No No evidence of focal consolidation or pleural effusion. IMPRESSION: Low lung volumes with mild right basilar subsegmental atelectasis. Electronically Signed   By: Norleen DELENA Kil M.D.   On: 12/22/2023 11:12     .Critical Care  Performed by: Ruthe Cornet, DO Authorized by: Ruthe Cornet, DO   Critical care provider statement:    Critical care time (minutes):  35   Critical care was necessary to treat or prevent imminent or life-threatening deterioration of the following conditions:  Metabolic crisis   Critical care was time spent personally by me on the following activities:  Blood draw for specimens, development of treatment plan with patient or surrogate, discussions with consultants, discussions with primary provider, evaluation of patient's response to treatment, examination of patient, obtaining history from patient or surrogate, ordering and performing treatments and interventions, ordering and review of laboratory studies, ordering and review of radiographic studies, pulse oximetry, re-evaluation of patient's condition and review of old charts   Care discussed with: admitting provider      Medications Ordered in the ED  heparin  injection 5,000 Units (has no administration in time range)  calcitRIOL  (ROCALTROL ) capsule 1.25 mcg (has no administration in time range)  carvedilol  (COREG ) tablet 25 mg (has no administration in time range)  DULoxetine  (CYMBALTA ) DR capsule 20 mg (has no administration in time range)  HYDROmorphone  (DILAUDID ) tablet 2 mg (has no administration in time range)  lamoTRIgine  (LAMICTAL ) tablet 200 mg (has no administration in time  range)  insulin  glargine-yfgn (SEMGLEE ) injection 10 Units (has no  administration in time range)  losartan  (COZAAR ) tablet 100 mg (has no administration in time range)  OLANZapine  zydis (ZYPREXA ) disintegrating tablet 5 mg (has no administration in time range)  pantoprazole  (PROTONIX ) EC tablet 40 mg (has no administration in time range)  Pancrelipase  (Lip-Prot-Amyl) 3000-9500 units CPEP (has no administration in time range)  sodium bicarbonate  tablet 1,300 mg (has no administration in time range)  0.9 %  sodium chloride  infusion ( Intravenous New Bag/Given 12/22/23 1119)  ondansetron  (ZOFRAN -ODT) disintegrating tablet 4 mg (4 mg Oral Given 12/22/23 1100)  fentaNYL  (SUBLIMAZE ) injection 50 mcg (50 mcg Intravenous Given 12/22/23 1115)                                    Medical Decision Making Amount and/or Complexity of Data Reviewed Labs: ordered. Radiology: ordered.  Risk Prescription drug management. Decision regarding hospitalization.   Ashley Freeman Freeman is here with nausea vomiting high blood sugar.  Some abdominal distention.  History of end-stage renal disease on dialysis gets intermittent paracentesis for volume overload as well.  Blood sugars over 600.  Bicarb is 18.  Anion gap is 16.  Differential diagnosis likely gastroparesis/viral process causing the symptoms.  Will put her on insulin  infusion but no volume boluses given that she looks volume overloaded on exam.  She not having any major abdominal tenderness but she has had ascites in the past from volume overload as well.  She gets paracentesis every so often.  Will place an order for this.  Will get a CT scan to evaluate for any other intra-abdominal process but I suspect that this is ascites.  She has no fever no chills.  I have no concern for SBP.  Potassium was 4.9.  Overall pH is 7.311.  She does not quite meet DKA criteria but will start her on insulin  infusion given blood sugar in the 600s.  No major leukocytosis.  Creatinine at baseline.  Chest x-ray with no obvious volume  overload.  Overall patient here with uncontrollable nausea and vomiting volume overload hyperglycemia.  Will start her on insulin  infusion.  Plan for CT scan abdomen pelvis and likely paracentesis today.  I have made nephrology team Dr. Susannah aware.  Patient admitted to medicine service with patient pending CT.  This chart was dictated using voice recognition software.  Despite best efforts to proofread,  errors can occur which can change the documentation meaning.      Final diagnoses:  Hyperglycemia  Nausea and vomiting, unspecified vomiting type  Hypervolemia, unspecified hypervolemia type    ED Discharge Orders     None          Ruthe Cornet, DO 12/22/23 1159

## 2023-12-22 NOTE — ED Notes (Signed)
 Awaiting pharm verification for meds 1200

## 2023-12-22 NOTE — ED Notes (Signed)
 Provider contacted about IV pain meds at pt request. Pt advised PO pain meds are not helping. Current pain is 10/10.

## 2023-12-22 NOTE — ED Notes (Signed)
 MD made aware of pt's CBG. Awaiting orders.

## 2023-12-23 ENCOUNTER — Inpatient Hospital Stay (HOSPITAL_COMMUNITY)

## 2023-12-23 DIAGNOSIS — R739 Hyperglycemia, unspecified: Secondary | ICD-10-CM | POA: Diagnosis not present

## 2023-12-23 HISTORY — PX: IR PARACENTESIS: IMG2679

## 2023-12-23 LAB — BASIC METABOLIC PANEL WITH GFR
Anion gap: 18 — ABNORMAL HIGH (ref 5–15)
Anion gap: 17 — ABNORMAL HIGH (ref 5–15)
BUN: 46 mg/dL — ABNORMAL HIGH (ref 6–20)
BUN: 27 mg/dL — ABNORMAL HIGH (ref 6–20)
CO2: 22 mmol/L (ref 22–32)
CO2: 22 mmol/L (ref 22–32)
Calcium: 8.8 mg/dL — ABNORMAL LOW (ref 8.9–10.3)
Calcium: 8.8 mg/dL — ABNORMAL LOW (ref 8.9–10.3)
Chloride: 93 mmol/L — ABNORMAL LOW (ref 98–111)
Chloride: 94 mmol/L — ABNORMAL LOW (ref 98–111)
Creatinine, Ser: 7.37 mg/dL — ABNORMAL HIGH (ref 0.44–1.00)
Creatinine, Ser: 5.11 mg/dL — ABNORMAL HIGH (ref 0.44–1.00)
GFR, Estimated: 7 mL/min — ABNORMAL LOW (ref >60)
GFR, Estimated: 11 mL/min — ABNORMAL LOW (ref >60)
Glucose, Bld: 69 mg/dL — ABNORMAL LOW (ref 70–99)
Glucose, Bld: 274 mg/dL — ABNORMAL HIGH (ref 70–99)
Potassium: 4.2 mmol/L (ref 3.5–5.1)
Potassium: 4.8 mmol/L (ref 3.5–5.1)
Sodium: 133 mmol/L — ABNORMAL LOW (ref 135–145)
Sodium: 133 mmol/L — ABNORMAL LOW (ref 135–145)

## 2023-12-23 LAB — BODY FLUID CELL COUNT WITH DIFFERENTIAL
Eos, Fluid: 0 %
Lymphs, Fluid: 46 %
Monocyte-Macrophage-Serous Fluid: 22 % — ABNORMAL LOW (ref 50–90)
Neutrophil Count, Fluid: 30 % — ABNORMAL HIGH (ref 0–25)
Other Cells, Fluid: 2 %
Total Nucleated Cell Count, Fluid: 178 uL (ref 0–1000)

## 2023-12-23 LAB — GLUCOSE, CAPILLARY
Glucose-Capillary: 143 mg/dL — ABNORMAL HIGH (ref 70–99)
Glucose-Capillary: 167 mg/dL — ABNORMAL HIGH (ref 70–99)
Glucose-Capillary: 17 mg/dL — CL (ref 70–99)
Glucose-Capillary: 173 mg/dL — ABNORMAL HIGH (ref 70–99)
Glucose-Capillary: 225 mg/dL — ABNORMAL HIGH (ref 70–99)
Glucose-Capillary: 268 mg/dL — ABNORMAL HIGH (ref 70–99)
Glucose-Capillary: 269 mg/dL — ABNORMAL HIGH (ref 70–99)
Glucose-Capillary: 54 mg/dL — ABNORMAL LOW (ref 70–99)
Glucose-Capillary: 58 mg/dL — ABNORMAL LOW (ref 70–99)
Glucose-Capillary: 91 mg/dL (ref 70–99)

## 2023-12-23 LAB — GRAM STAIN

## 2023-12-23 LAB — ALBUMIN, PLEURAL OR PERITONEAL FLUID: Albumin, Fluid: 1.5 g/dL

## 2023-12-23 LAB — ALBUMIN: Albumin: 2.6 g/dL — ABNORMAL LOW (ref 3.5–5.0)

## 2023-12-23 LAB — BETA-HYDROXYBUTYRIC ACID: Beta-Hydroxybutyric Acid: 0.08 mmol/L (ref 0.05–0.27)

## 2023-12-23 MED ORDER — LIDOCAINE HCL 1 % IJ SOLN
INTRAMUSCULAR | Status: AC
  Filled 2023-12-23: qty 20

## 2023-12-23 MED ORDER — GLUCOSE 40 % PO GEL: 2.0000 | ORAL | Status: AC

## 2023-12-23 MED ORDER — INSULIN ASPART 100 UNIT/ML IJ SOLN: 0.0000 [IU] | Freq: Three times a day (TID) | INTRAMUSCULAR | Status: DC

## 2023-12-23 MED ORDER — ALBUMIN HUMAN 25 % IV SOLN
25.0000 g | Freq: Four times a day (QID) | INTRAVENOUS | Status: AC
  Administered 2023-12-23 – 2023-12-24 (×2): 25 g via INTRAVENOUS
  Filled 2023-12-23 (×2): qty 100

## 2023-12-23 MED ORDER — INSULIN GLARGINE 100 UNIT/ML ~~LOC~~ SOLN
10.0000 [IU] | Freq: Every day | SUBCUTANEOUS | Status: DC
  Filled 2023-12-23: qty 0.1

## 2023-12-23 MED ORDER — INSULIN ASPART 100 UNIT/ML IJ SOLN
0.0000 [IU] | INTRAMUSCULAR | Status: DC
  Administered 2023-12-23: 1 [IU] via SUBCUTANEOUS

## 2023-12-23 MED ORDER — INSULIN ASPART 100 UNIT/ML IJ SOLN
0.0000 [IU] | INTRAMUSCULAR | Status: DC
  Administered 2023-12-23 – 2023-12-24 (×3): 3 [IU] via SUBCUTANEOUS

## 2023-12-23 MED ORDER — DEXTROSE 10 % IV SOLN: INTRAVENOUS | Status: DC

## 2023-12-23 MED ORDER — LIDOCAINE HCL (PF) 1 % IJ SOLN
10.0000 mL | Freq: Once | INTRAMUSCULAR | Status: DC
  Filled 2023-12-23: qty 10

## 2023-12-23 MED ORDER — INSULIN GLARGINE 100 UNIT/ML ~~LOC~~ SOLN
5.0000 [IU] | Freq: Every day | SUBCUTANEOUS | Status: DC
  Administered 2023-12-23 – 2023-12-24 (×2): 5 [IU] via SUBCUTANEOUS
  Filled 2023-12-23 (×2): qty 0.05

## 2023-12-23 MED ORDER — METOCLOPRAMIDE HCL 5 MG/ML IJ SOLN
5.0000 mg | Freq: Four times a day (QID) | INTRAMUSCULAR | Status: DC
  Administered 2023-12-23 – 2023-12-24 (×4): 5 mg via INTRAVENOUS
  Filled 2023-12-23 (×4): qty 2

## 2023-12-23 MED ORDER — INSULIN ASPART 100 UNIT/ML IJ SOLN: 0.0000 [IU] | Freq: Every day | INTRAMUSCULAR | Status: DC

## 2023-12-23 MED ORDER — PANCRELIPASE (LIP-PROT-AMYL) 12000-38000 UNITS PO CPEP
12000.0000 [IU] | ORAL_CAPSULE | Freq: Two times a day (BID) | ORAL | Status: DC
  Administered 2023-12-23: 12000 [IU] via ORAL
  Filled 2023-12-23 (×3): qty 1

## 2023-12-23 NOTE — Progress Notes (Signed)
   12/23/23 1105  Vitals  Temp (!) 97.4 F (36.3 C)  Pulse Rate 82  Resp 14  BP (!) 188/104  SpO2 100 %  O2 Device Room Air  Weight 67.5 kg  Type of Weight Post-Dialysis  Oxygen Therapy  Patient Activity (if Appropriate) In bed  Pulse Oximetry Type Continuous  Oximetry Probe Site Changed No  Post Treatment  Dialyzer Clearance Lightly streaked  Hemodialysis Intake (mL) 0 mL  Liters Processed 68.3  Fluid Removed (mL) 5000 mL  Tolerated HD Treatment Yes  AVG/AVF Arterial Site Held (minutes) 10 minutes  AVG/AVF Venous Site Held (minutes) 10 minutes   Received patient in bed to unit.  Alert and oriented.  Informed consent signed and in chart.   TX duration:3.25HRS  Patient tolerated well.  Transported back to the room  Alert, without acute distress.  Hand-off given to patient's nurse.   Access used: RAVF Access issues: NONE  Total UF removed: 5L Medication(s) given: NONE  Ashley Freeman [facility]

## 2023-12-23 NOTE — Procedures (Signed)
 PROCEDURE SUMMARY:  Successful image-guided paracentesis from the right lower abdomen.  Yielded 5 liters of clear, yellow fluid.  No immediate complications.  EBL = trace. Patient tolerated well.   Specimen was sent for labs.  Please see imaging section of Epic for full dictation.  Lavanda JAYSON Jurist PA-C 12/23/2023 4:39 PM

## 2023-12-23 NOTE — Progress Notes (Signed)
 Ashley Freeman is an 33 y.o. female w/ PMH NICM, hyperlipidemia, hypertension, bipolar disorder, HFrE (EF 30 to 35% in 08/2023), diabetes, STDs, ESRD p/w o lethargy, abd pain, N/V and SOB. She is on HD TTS in [place] w/ [facility]. Last outpt HD Sat (had also HD here on Friday prior to d/c home). HD is TTS. Gets paracentesis done regularly for abd swelling. In ED Abd CT showed moderate ascites and anasarca, progressed since the prior CT. No bowel obstruction. Normal appendix. Patient was admitted for uncont DM, vol overload. We are asked to see for ESRD.     Home bp meds: Bumex  10mg  every day Coreg  25 bid Losartan  100 qd    OP HD: TTS [facility] [address] from 11/09/23 -> 3h  60kg  B350   AVG  Hep 1600 + 600 u/hr  Assessment/Plan: Hypervolemia: With abdominal pain and distention, possible early pulmonary edema as well.  Patient is getting dialysis this morning for volume overload with 4 to 5 L goal. ESRD: on HD TTS. HD in progress this morning.  Seen on dialysis through a right upper arm AV shunt 3K bath 5 L net UF goal BP still remains elevated at 185/103 but the patient is asymptomatic  HTN: bp's remain up on HD, rx w/ home meds post hd prn Volume: vol excess, ultrafiltration today should help, second treatment here in the hospital. 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  Ascites -May be going for paracentesis today DM On insulin  therapy per primary Bipolar disorder -being managed by the primary  Subjective: Denies fever chills nausea vomiting shortness of breath but still has abdominal pain   Chemistry and CBC: Creatinine  Date/Time Value Ref Range Status  11/12/2013 04:23 AM 0.59 (L) 0.60 - 1.30 mg/dL Final  92/84/7984 95:68 AM 0.70 0.60 - 1.30 mg/dL Final  92/85/7984 91:56 PM 0.71 0.60 - 1.30 mg/dL Final  94/83/7986 93:76 AM 0.69 0.60 - 1.30 mg/dL Final  94/84/7986 97:46 AM 0.84 0.60 - 1.30 mg/dL Final  94/85/7986 88:83 AM 0.83 0.60 - 1.30 mg/dL Final   94/85/7986 96:71 AM 0.99 0.60 - 1.30 mg/dL Final  94/86/7986 88:89 PM 1.01 0.60 - 1.30 mg/dL Final  94/86/7986 98:43 PM 1.44 (H) 0.60 - 1.30 mg/dL Final   Creat  Date/Time Value Ref Range Status  08/24/2013 09:57 AM 0.51 0.50 - 1.10 mg/dL Final  92/90/7986 96:43 PM 0.59 0.50 - 1.10 mg/dL Final   Creatinine, Ser  Date/Time Value Ref Range Status  12/23/2023 01:36 AM 7.37 (H) 0.44 - 1.00 mg/dL Final  91/75/7974 92:57 PM 7.51 (H) 0.44 - 1.00 mg/dL Final  91/75/7974 96:49 PM 7.28 (H) 0.44 - 1.00 mg/dL Final  91/75/7974 87:64 PM 7.38 (H) 0.44 - 1.00 mg/dL Final  91/75/7974 89:63 AM 7.10 (H) 0.44 - 1.00 mg/dL Final  91/75/7974 89:70 AM 7.33 (H) 0.44 - 1.00 mg/dL Final  91/77/7974 96:77 AM 8.39 (H) 0.44 - 1.00 mg/dL Final  91/78/7974 89:83 PM 8.45 (H) 0.44 - 1.00 mg/dL Final  91/84/7974 91:85 PM 6.12 (H) 0.44 - 1.00 mg/dL Final  92/68/7974 97:66 AM 6.84 (H) 0.44 - 1.00 mg/dL Final  92/69/7974 97:59 AM 8.42 (H) 0.44 - 1.00 mg/dL Final  92/70/7974 90:64 PM 8.23 (H) 0.44 - 1.00 mg/dL Final  92/70/7974 93:88 AM 8.47 (H) 0.44 - 1.00 mg/dL Final  92/70/7974 97:85 AM 7.90 (H) 0.44 - 1.00 mg/dL Final  92/70/7974 87:54 AM 8.31 (H) 0.44 - 1.00 mg/dL Final  92/78/7974 93:69 AM 5.05 (H) 0.44 -  1.00 mg/dL Final  92/79/7974 92:72 PM 8.20 (H) 0.44 - 1.00 mg/dL Final  92/79/7974 93:46 PM 8.00 (H) 0.44 - 1.00 mg/dL Final  92/82/7974 98:94 PM 8.40 (H) 0.44 - 1.00 mg/dL Final  92/84/7974 95:70 AM 7.15 (H) 0.44 - 1.00 mg/dL Final  92/85/7974 94:88 PM 6.93 (H) 0.44 - 1.00 mg/dL Final  92/85/7974 95:76 PM 7.00 (H) 0.44 - 1.00 mg/dL Final  92/89/7974 97:48 PM 7.14 (H) 0.44 - 1.00 mg/dL Final  92/91/7974 91:93 AM 7.60 (H) 0.44 - 1.00 mg/dL Final  92/93/7974 94:60 AM 9.48 (H) 0.44 - 1.00 mg/dL Final  92/94/7974 90:59 PM 9.28 (H) 0.44 - 1.00 mg/dL Final  92/96/7974 90:50 PM 8.81 (H) 0.44 - 1.00 mg/dL Final  92/96/7974 90:51 PM 8.40 (H) 0.44 - 1.00 mg/dL Final  92/98/7974 97:42 AM 6.68 (H) 0.44 - 1.00 mg/dL  Final  93/69/7974 91:46 PM 6.39 (H) 0.44 - 1.00 mg/dL Final  93/69/7974 94:83 PM 6.32 (H) 0.44 - 1.00 mg/dL Final  93/69/7974 98:59 PM 6.37 (H) 0.44 - 1.00 mg/dL Final  93/69/7974 90:46 AM 6.44 (H) 0.44 - 1.00 mg/dL Final  93/69/7974 91:80 AM 6.36 (H) 0.44 - 1.00 mg/dL Final  93/69/7974 97:85 AM 6.22 (H) 0.44 - 1.00 mg/dL Final  93/70/7974 95:69 AM 8.67 (H) 0.44 - 1.00 mg/dL Final  93/71/7974 89:80 AM 8.14 (H) 0.44 - 1.00 mg/dL Final  93/72/7974 88:65 AM 7.71 (H) 0.44 - 1.00 mg/dL Final  93/72/7974 92:51 AM 7.64 (H) 0.44 - 1.00 mg/dL Final  93/74/7974 96:89 AM 8.96 (H) 0.44 - 1.00 mg/dL Final  93/77/7974 89:59 PM 6.70 (H) 0.44 - 1.00 mg/dL Final  93/78/7974 93:98 AM 7.81 (H) 0.44 - 1.00 mg/dL Final  93/83/7974 95:86 AM 6.52 (H) 0.44 - 1.00 mg/dL Final  93/85/7974 95:82 AM 7.74 (H) 0.44 - 1.00 mg/dL Final  93/92/7974 89:41 PM 8.60 (H) 0.44 - 1.00 mg/dL Final  93/92/7974 89:52 PM 8.90 (H) 0.44 - 1.00 mg/dL Final  94/68/7974 97:76 AM 6.55 (H) 0.44 - 1.00 mg/dL Final  94/87/7974 93:79 AM 7.04 (H) 0.44 - 1.00 mg/dL Final  94/98/7974 88:49 AM 6.59 (H) 0.44 - 1.00 mg/dL Final  95/70/7974 87:94 PM 6.32 (H) 0.44 - 1.00 mg/dL Final  95/70/7974 94:96 AM 6.10 (H) 0.44 - 1.00 mg/dL Final  95/70/7974 97:81 AM 6.35 (H) 0.44 - 1.00 mg/dL Final   Recent Labs  Lab 12/19/23 2216 12/20/23 0322 12/22/23 1029 12/22/23 1036 12/22/23 1156 12/22/23 1235 12/22/23 1550 12/22/23 1942 12/23/23 0136  NA 135 138 128* 126* 127* 128* 129* 130* 133*  K 4.4 4.3 4.9 4.9 4.9 5.1 4.8 4.6 4.2  CL 102 106 94* 94*  --  92* 92* 92* 93*  CO2 17* 13* 18*  --   --  20* 17* 21* 22  GLUCOSE 424* 281* 616* 607*  --  580* 485* 454* 69*  BUN 45* 49* 46* 50*  --  44* 46* 46* 46*  CREATININE 8.45* 8.39* 7.33* 7.10*  --  7.38* 7.28* 7.51* 7.37*  CALCIUM  8.4* 8.5* 8.6*  --   --  8.6* 8.6* 8.7* 8.8*   Recent Labs  Lab 12/19/23 2216 12/20/23 0726 12/22/23 1029 12/22/23 1036 12/22/23 1156 12/22/23 1235 12/22/23 1942   WBC 7.7 11.4* 10.7*  --   --  11.6* 11.7*  NEUTROABS 4.4  --  6.1  --   --   --  7.4  HGB 11.1* 9.9* 8.3* 9.5* 9.2* 8.1* 8.5*  HCT 36.5 32.5* 27.3* 28.0* 27.0* 26.7* 27.4*  MCV 89.7  89.5 90.4  --   --  89.9 88.1  PLT 284 352 291  --   --  324 317   Liver Function Tests: Recent Labs  Lab 12/19/23 2216 12/22/23 1029  AST 34 19  ALT 36 28  ALKPHOS 715* 626*  BILITOT 1.2 1.0  PROT 5.5* 5.6*  ALBUMIN  2.6* 2.5*   No results for input(s): LIPASE, AMYLASE in the last 168 hours. No results for input(s): AMMONIA in the last 168 hours. Cardiac Enzymes: No results for input(s): CKTOTAL, CKMB, CKMBINDEX, TROPONINI in the last 168 hours. Iron  Studies: No results for input(s): IRON , TIBC, TRANSFERRIN, FERRITIN in the last 72 hours. PT/INR: @LABRCNTIP (inr:5)  Xrays/Other Studies: ) Results for orders placed or performed during the hospital encounter of 12/22/23 (from the past 48 hours)  POC CBG, ED     Status: Abnormal   Collection Time: 12/22/23 10:15 AM  Result Value Ref Range   Glucose-Capillary >600 (HH) 70 - 99 mg/dL    Comment: Glucose reference range applies only to samples taken after fasting for at least 8 hours.  Troponin I (High Sensitivity)     Status: None   Collection Time: 12/22/23 10:26 AM  Result Value Ref Range   Troponin I (High Sensitivity) 17 <18 ng/L    Comment: (NOTE) Elevated high sensitivity troponin I (hsTnI) values and significant  changes across serial measurements may suggest ACS but many other  chronic and acute conditions are known to elevate hsTnI results.  Refer to the Links section for chest pain algorithms and additional  guidance. Performed at [facility], [address] [place], [place] [address]   CBC with Differential     Status: Abnormal   Collection Time: 12/22/23 10:29 AM  Result Value Ref Range   WBC 10.7 (H) 4.0 - 10.5 K/uL    Comment: REPEATED TO VERIFY WHITE COUNT CONFIRMED ON SMEAR    RBC 3.02 (L)  3.87 - 5.11 MIL/uL   Hemoglobin 8.3 (L) 12.0 - 15.0 g/dL   HCT 72.6 (L) 63.9 - 53.9 %   MCV 90.4 80.0 - 100.0 fL   MCH 27.5 26.0 - 34.0 pg   MCHC 30.4 30.0 - 36.0 g/dL   RDW 83.5 (H) 88.4 - 84.4 %   Platelets 291 150 - 400 K/uL    Comment: SPECIMEN CHECKED FOR CLOTS REPEATED TO VERIFY PLATELET COUNT CONFIRMED BY SMEAR    nRBC 0.0 0.0 - 0.2 %   Neutrophils Relative % 56 %   Neutro Abs 6.1 1.7 - 7.7 K/uL   Lymphocytes Relative 24 %   Lymphs Abs 2.6 0.7 - 4.0 K/uL   Monocytes Relative 12 %   Monocytes Absolute 1.2 (H) 0.1 - 1.0 K/uL   Eosinophils Relative 6 %   Eosinophils Absolute 0.6 (H) 0.0 - 0.5 K/uL   Basophils Relative 1 %   Basophils Absolute 0.1 0.0 - 0.1 K/uL   WBC Morphology MORPHOLOGY UNREMARKABLE    RBC Morphology MORPHOLOGY UNREMARKABLE    Smear Review Normal platelet morphology    Immature Granulocytes 1 %   Abs Immature Granulocytes 0.11 (H) 0.00 - 0.07 K/uL   Abs Granulocyte 6.1 1.5 - 6.5 K/uL    Comment: Performed at [facility], [address], [place], [place] [address]  Comprehensive metabolic panel     Status: Abnormal   Collection Time: 12/22/23 10:29 AM  Result Value Ref Range   Sodium 128 (L) 135 - 145 mmol/L    Comment: DELTA CHECK  NOTED   Potassium 4.9 3.5 - 5.1 mmol/L   Chloride 94 (L) 98 - 111 mmol/L   CO2 18 (L) 22 - 32 mmol/L   Glucose, Bld 616 (HH) 70 - 99 mg/dL    Comment: CRITICAL RESULT CALLED TO, READ BACK BY AND VERIFIED WITH KAMIS. JESSICA RN @ 1136 12/22/23 S OUROBANGNA Glucose reference range applies only to samples taken after fasting for at least 8 hours.    BUN 46 (H) 6 - 20 mg/dL   Creatinine, Ser 2.66 (H) 0.44 - 1.00 mg/dL   Calcium  8.6 (L) 8.9 - 10.3 mg/dL   Total Protein 5.6 (L) 6.5 - 8.1 g/dL   Albumin  2.5 (L) 3.5 - 5.0 g/dL   AST 19 15 - 41 U/L   ALT 28 0 - 44 U/L   Alkaline Phosphatase 626 (H) 38 - 126 U/L   Total Bilirubin 1.0 0.0 - 1.2 mg/dL   GFR, Estimated 7 (L) >60 mL/min    Comment: (NOTE) Calculated using  the CKD-EPI Creatinine Equation (2021)    Anion gap 16 (H) 5 - 15    Comment: Performed at [facility], [address], [place], [place] [address]  Beta-hydroxybutyric acid     Status: None   Collection Time: 12/22/23 10:29 AM  Result Value Ref Range   Beta-Hydroxybutyric Acid 0.10 0.05 - 0.27 mmol/L    Comment: Performed at [facility], [address], [place], [place] [address]  I-stat chem 8, ED (not at [facility], [facility] or [facility])     Status: Abnormal   Collection Time: 12/22/23 10:36 AM  Result Value Ref Range   Sodium 126 (L) 135 - 145 mmol/L   Potassium 4.9 3.5 - 5.1 mmol/L   Chloride 94 (L) 98 - 111 mmol/L   BUN 50 (H) 6 - 20 mg/dL   Creatinine, Ser 2.89 (H) 0.44 - 1.00 mg/dL   Glucose, Bld 392 (HH) 70 - 99 mg/dL    Comment: Glucose reference range applies only to samples taken after fasting for at least 8 hours.   Calcium , Ion 1.00 (L) 1.15 - 1.40 mmol/L   TCO2 20 (L) 22 - 32 mmol/L   Hemoglobin 9.5 (L) 12.0 - 15.0 g/dL   HCT 71.9 (L) 63.9 - 53.9 %   Comment NOTIFIED PHYSICIAN   I-Stat CG4 Lactic Acid, ED     Status: Abnormal   Collection Time: 12/22/23 10:36 AM  Result Value Ref Range   Lactic Acid, Venous 2.7 (HH) 0.5 - 1.9 mmol/L   Comment NOTIFIED PHYSICIAN   hCG, serum, qualitative     Status: None   Collection Time: 12/22/23 11:00 AM  Result Value Ref Range   Preg, Serum NEGATIVE NEGATIVE    Comment:        THE SENSITIVITY OF THIS METHODOLOGY IS >10 mIU/mL. Performed at [facility], [address], [place], [place] [address]   I-Stat venous blood gas, [facility], [facility], [facility])     Status: Abnormal   Collection Time: 12/22/23 11:56 AM  Result Value Ref Range   pH, Ven 7.311 7.25 - 7.43   pCO2, Ven 45.7 44 - 60 mmHg   pO2, Ven 32 32 - 45 mmHg   Bicarbonate 23.1 20.0 - 28.0 mmol/L   TCO2 24 22 - 32 mmol/L   O2 Saturation 56 %   Acid-base deficit 3.0 (H) 0.0 - 2.0 mmol/L   Sodium 127 (L) 135 - 145 mmol/L   Potassium 4.9 3.5 -  5.1 mmol/L   Calcium , Ion  1.10 (L) 1.15 - 1.40 mmol/L   HCT 27.0 (L) 36.0 - 46.0 %   Hemoglobin 9.2 (L) 12.0 - 15.0 g/dL   Sample type VENOUS    Comment NOTIFIED PHYSICIAN   Troponin I (High Sensitivity)     Status: None   Collection Time: 12/22/23 12:26 PM  Result Value Ref Range   Troponin I (High Sensitivity) 16 <18 ng/L    Comment: (NOTE) Elevated high sensitivity troponin I (hsTnI) values and significant  changes across serial measurements may suggest ACS but many other  chronic and acute conditions are known to elevate hsTnI results.  Refer to the Links section for chest pain algorithms and additional  guidance. Performed at [facility], [address], [place], [place] [address]   CBG monitoring, ED     Status: Abnormal   Collection Time: 12/22/23 12:29 PM  Result Value Ref Range   Glucose-Capillary 545 (HH) 70 - 99 mg/dL    Comment: Glucose reference range applies only to samples taken after fasting for at least 8 hours.   Comment 1 Notify RN    Comment 2 Document in Chart   Basic metabolic panel     Status: Abnormal   Collection Time: 12/22/23 12:35 PM  Result Value Ref Range   Sodium 128 (L) 135 - 145 mmol/L   Potassium 5.1 3.5 - 5.1 mmol/L   Chloride 92 (L) 98 - 111 mmol/L   CO2 20 (L) 22 - 32 mmol/L   Glucose, Bld 580 (HH) 70 - 99 mg/dL    Comment: CRITICAL RESULT CALLED TO, READ BACK BY AND VERIFIED WITH HANLEY.B RN @1314  12/22/2023 [email or web address] Glucose reference range applies only to samples taken after fasting for at least 8 hours.    BUN 44 (H) 6 - 20 mg/dL   Creatinine, Ser 2.61 (H) 0.44 - 1.00 mg/dL   Calcium  8.6 (L) 8.9 - 10.3 mg/dL   GFR, Estimated 7 (L) >60 mL/min    Comment: (NOTE) Calculated using the CKD-EPI Creatinine Equation (2021)    Anion gap 16 (H) 5 - 15    Comment: Performed at [facility], [address] [place], [place] [address]  CBC     Status: Abnormal   Collection Time: 12/22/23 12:35 PM  Result Value Ref Range   WBC 11.6 (H) 4.0 - 10.5 K/uL    RBC 2.97 (L) 3.87 - 5.11 MIL/uL   Hemoglobin 8.1 (L) 12.0 - 15.0 g/dL   HCT 73.2 (L) 63.9 - 53.9 %   MCV 89.9 80.0 - 100.0 fL   MCH 27.3 26.0 - 34.0 pg   MCHC 30.3 30.0 - 36.0 g/dL   RDW 83.6 (H) 88.4 - 84.4 %   Platelets 324 150 - 400 K/uL   nRBC 0.0 0.0 - 0.2 %    Comment: Performed at [facility], [address] [place], [place] [address]  Basic metabolic panel     Status: Abnormal   Collection Time: 12/22/23  3:50 PM  Result Value Ref Range   Sodium 129 (L) 135 - 145 mmol/L   Potassium 4.8 3.5 - 5.1 mmol/L   Chloride 92 (L) 98 - 111 mmol/L   CO2 17 (L) 22 - 32 mmol/L   Glucose, Bld 485 (H) 70 - 99 mg/dL    Comment: Glucose reference range applies only to samples taken after fasting for at least 8 hours.   BUN 46 (H) 6 - 20 mg/dL  Creatinine, Ser 7.28 (H) 0.44 - 1.00 mg/dL   Calcium  8.6 (L) 8.9 - 10.3 mg/dL   GFR, Estimated 7 (L) >60 mL/min    Comment: (NOTE) Calculated using the CKD-EPI Creatinine Equation (2021)    Anion gap 20 (H) 5 - 15    Comment: Performed at [facility], [address], [place], [place] [address]  CBG monitoring, ED     Status: Abnormal   Collection Time: 12/22/23  6:04 PM  Result Value Ref Range   Glucose-Capillary 528 (HH) 70 - 99 mg/dL    Comment: Glucose reference range applies only to samples taken after fasting for at least 8 hours.   Comment 1 Notify RN   hCG, serum, qualitative     Status: None   Collection Time: 12/22/23  7:42 PM  Result Value Ref Range   Preg, Serum NEGATIVE NEGATIVE    Comment:        THE SENSITIVITY OF THIS METHODOLOGY IS >10 mIU/mL. Performed at [facility], [address] [place], [place] [address]   Basic metabolic panel     Status: Abnormal   Collection Time: 12/22/23  7:42 PM  Result Value Ref Range   Sodium 130 (L) 135 - 145 mmol/L   Potassium 4.6 3.5 - 5.1 mmol/L   Chloride 92 (L) 98 - 111 mmol/L   CO2 21 (L) 22 - 32 mmol/L   Glucose, Bld 454 (H) 70 - 99 mg/dL    Comment:  Glucose reference range applies only to samples taken after fasting for at least 8 hours.   BUN 46 (H) 6 - 20 mg/dL   Creatinine, Ser 2.48 (H) 0.44 - 1.00 mg/dL   Calcium  8.7 (L) 8.9 - 10.3 mg/dL   GFR, Estimated 7 (L) >60 mL/min    Comment: (NOTE) Calculated using the CKD-EPI Creatinine Equation (2021)    Anion gap 17 (H) 5 - 15    Comment: Performed at [facility], [address] [place], [place] [address]  CBC with Differential (PNL)     Status: Abnormal   Collection Time: 12/22/23  7:42 PM  Result Value Ref Range   WBC 11.7 (H) 4.0 - 10.5 K/uL   RBC 3.11 (L) 3.87 - 5.11 MIL/uL   Hemoglobin 8.5 (L) 12.0 - 15.0 g/dL   HCT 72.5 (L) 63.9 - 53.9 %   MCV 88.1 80.0 - 100.0 fL   MCH 27.3 26.0 - 34.0 pg   MCHC 31.0 30.0 - 36.0 g/dL   RDW 83.7 (H) 88.4 - 84.4 %   Platelets 317 150 - 400 K/uL   nRBC 0.0 0.0 - 0.2 %   Neutrophils Relative % 63 %   Neutro Abs 7.4 1.7 - 7.7 K/uL   Lymphocytes Relative 23 %   Lymphs Abs 2.7 0.7 - 4.0 K/uL   Monocytes Relative 6 %   Monocytes Absolute 0.7 0.1 - 1.0 K/uL   Eosinophils Relative 6 %   Eosinophils Absolute 0.7 (H) 0.0 - 0.5 K/uL   Basophils Relative 2 %   Basophils Absolute 0.2 (H) 0.0 - 0.1 K/uL   WBC Morphology See Note     Comment:  Mild Left Shift. 1 to 5% Metas, occ myelo   Smear Review See Note     Comment:  Normal Platelet Morphology   Polychromasia PRESENT     Comment: Performed at [facility], [address], [place], [place] [address]  Glucose, capillary     Status:  Abnormal   Collection Time: 12/22/23  8:09 PM  Result Value Ref Range   Glucose-Capillary 416 (H) 70 - 99 mg/dL    Comment: Glucose reference range applies only to samples taken after fasting for at least 8 hours.  Glucose, capillary     Status: Abnormal   Collection Time: 12/22/23  9:16 PM  Result Value Ref Range   Glucose-Capillary 385 (H) 70 - 99 mg/dL    Comment: Glucose reference range applies only to samples taken after fasting for at least 8  hours.  Glucose, capillary     Status: Abnormal   Collection Time: 12/22/23 10:16 PM  Result Value Ref Range   Glucose-Capillary 332 (H) 70 - 99 mg/dL    Comment: Glucose reference range applies only to samples taken after fasting for at least 8 hours.  Glucose, capillary     Status: Abnormal   Collection Time: 12/22/23 11:18 PM  Result Value Ref Range   Glucose-Capillary 238 (H) 70 - 99 mg/dL    Comment: Glucose reference range applies only to samples taken after fasting for at least 8 hours.  Glucose, capillary     Status: Abnormal   Collection Time: 12/23/23 12:23 AM  Result Value Ref Range   Glucose-Capillary 143 (H) 70 - 99 mg/dL    Comment: Glucose reference range applies only to samples taken after fasting for at least 8 hours.  Glucose, capillary     Status: None   Collection Time: 12/23/23  1:35 AM  Result Value Ref Range   Glucose-Capillary 91 70 - 99 mg/dL    Comment: Glucose reference range applies only to samples taken after fasting for at least 8 hours.  Basic metabolic panel     Status: Abnormal   Collection Time: 12/23/23  1:36 AM  Result Value Ref Range   Sodium 133 (L) 135 - 145 mmol/L   Potassium 4.2 3.5 - 5.1 mmol/L   Chloride 93 (L) 98 - 111 mmol/L   CO2 22 22 - 32 mmol/L   Glucose, Bld 69 (L) 70 - 99 mg/dL    Comment: Glucose reference range applies only to samples taken after fasting for at least 8 hours.   BUN 46 (H) 6 - 20 mg/dL   Creatinine, Ser 2.62 (H) 0.44 - 1.00 mg/dL   Calcium  8.8 (L) 8.9 - 10.3 mg/dL   GFR, Estimated 7 (L) >60 mL/min    Comment: (NOTE) Calculated using the CKD-EPI Creatinine Equation (2021)    Anion gap 18 (H) 5 - 15    Comment: Performed at [facility], [address], [place], [place] [address]  Beta-hydroxybutyric acid     Status: None   Collection Time: 12/23/23  1:36 AM  Result Value Ref Range   Beta-Hydroxybutyric Acid 0.08 0.05 - 0.27 mmol/L    Comment: Performed at [facility], [address]  [place], [place] [address]  Glucose, capillary     Status: Abnormal   Collection Time: 12/23/23  3:16 AM  Result Value Ref Range   Glucose-Capillary 54 (L) 70 - 99 mg/dL    Comment: Glucose reference range applies only to samples taken after fasting for at least 8 hours.  Glucose, capillary     Status: Abnormal   Collection Time: 12/23/23  3:41 AM  Result Value Ref Range   Glucose-Capillary 167 (H) 70 - 99 mg/dL    Comment: Glucose reference range applies only to samples taken after fasting for at least 8 hours.  CT ABDOMEN PELVIS WO CONTRAST Result Date: 12/22/2023 CLINICAL DATA:  Abdominal pain EXAM: CT ABDOMEN AND PELVIS WITHOUT CONTRAST TECHNIQUE: Multidetector CT imaging of the abdomen and pelvis was performed following the standard protocol without IV contrast. RADIATION DOSE REDUCTION: This exam was performed according to the departmental dose-optimization program which includes automated exposure control, adjustment of the mA and/or kV according to patient size and/or use of iterative reconstruction technique. COMPARISON:  CT abdomen pelvis dated 11/06/2023. FINDINGS: Evaluation of this exam is limited in the absence of intravenous contrast. Lower chest: Partially visualized small right pleural effusion with partial compressive atelectasis of the right lung base. There is mild cardiomegaly. There is hypoattenuation of the cardiac blood pool suggestive of anemia. Clinical correlation is recommended. No intra-abdominal free air.  Moderate ascites. Hepatobiliary: Mildly enlarged liver measuring 17 cm in midclavicular length. No biliary dilatation. Cholecystectomy. Pancreas: The pancreas is mildly atrophic for age. No dilatation of the main pancreatic duct Spleen: Normal in size without focal abnormality. Adrenals/Urinary Tract: The adrenal glands are unremarkable. Mild bilateral renal parenchyma atrophy. There is no hydronephrosis or nephrolithiasis on either side. The visualized ureters and urinary  bladder appear unremarkable. Stomach/Bowel: There is no bowel obstruction. The appendix is normal. Vascular/Lymphatic: The abdominal aorta and IVC are unremarkable. Atherosclerotic calcification of the mesenteric arteries. No portal venous gas. No obvious adenopathy. Reproductive: The uterus is grossly unremarkable. No suspicious adnexal masses. Other: Diffuse subcutaneous edema and anasarca. Musculoskeletal: No acute or significant osseous findings. IMPRESSION: 1. Moderate ascites and anasarca, progressed since the prior CT. 2. No bowel obstruction. Normal appendix. 3. Partially visualized small right pleural effusion with partial compressive atelectasis of the right lung base. 4. Mild cardiomegaly. Electronically Signed   By: Vanetta Chou M.D.   On: 12/22/2023 12:42   DG Chest Portable 1 View Result Date: 12/22/2023 CLINICAL DATA:  Shortness of breath.  Hyperglycemia. EXAM: PORTABLE CHEST 1 VIEW COMPARISON:  12/19/2023 FINDINGS: Heart size is mildly enlarged, likely exaggerated by low lung volumes. Mild linear opacity in the right lung base is new and consistent with mild subsegmental atelectasis. No No evidence of focal consolidation or pleural effusion. IMPRESSION: Low lung volumes with mild right basilar subsegmental atelectasis. Electronically Signed   By: Norleen DELENA Kil M.D.   On: 12/22/2023 11:12    PMH:   Past Medical History:  Diagnosis Date   Abscess, gluteal, right 08/24/2013   Anemia 02/19/2012   Bartholin's gland abscess 09/19/2013   Bipolar disorder (HCC)    BV (bacterial vaginosis) 11/24/2015   Depression    Diabetes mellitus type I (HCC) 2001   Diagnosed at age 4 ; Type I   Diarrhea 05/30/2016   DKA (diabetic ketoacidoses) 08/19/2013   Also in 2018   ESRD (end stage renal disease) (HCC)    Gonorrhea 08/2011   Treated in 09/2011   HFrEF (heart failure with reduced ejection fraction) (HCC)    a. 2022 Echo: EF 40%; b. 10/2021 Echo: EF 55%; b. 07/2022 MV: No ischemia. EF 31%; c.  08/2022 Echo: EF 35%, mildly dil RV, sev TR.   History of trichomoniasis 05/31/2016   Hyperlipidemia 03/28/2016   Hypertension    NICM (nonischemic cardiomyopathy) (HCC)    Sepsis (HCC) 09/19/2013    PSH:   Past Surgical History:  Procedure Laterality Date   A/V FISTULAGRAM Right 06/17/2023   Procedure: A/V Fistulagram;  Surgeon: Marea Selinda RAMAN, MD;  Location: [facility];  Service: Cardiovascular;  Laterality: Right;   A/V SHUNT INTERVENTION  N/A 09/25/2023   Procedure: A/V SHUNT INTERVENTION;  Surgeon: Pearline Norman RAMAN, MD;  Location: [facility];  Service: Cardiovascular;  Laterality: N/A;   AV FISTULA PLACEMENT Right 07/06/2022   Procedure: ARTERIOVENOUS GRAFT CREATION;  Surgeon: Gretta Lonni PARAS, MD;  Location: [facility] OR;  Service: Vascular;  Laterality: Right;   CESAREAN SECTION N/A 10/05/2019   Procedure: CESAREAN SECTION;  Surgeon: Izell Harari, MD;  Location: [facility] LD ORS;  Service: Obstetrics;  Laterality: N/A;   CHOLECYSTECTOMY N/A 07/02/2023   Procedure: LAPAROSCOPIC CHOLECYSTECTOMY;  Surgeon: Ebbie Cough, MD;  Location: [facility] OR;  Service: General;  Laterality: N/A;   INCISION AND DRAINAGE ABSCESS Left 09/28/2019   Procedure: INCISION AND DRAINAGE VULVAR ABCESS;  Surgeon: Edsel Norleen GAILS, MD;  Location: [facility] OR;  Service: Gynecology;  Laterality: Left;   INCISION AND DRAINAGE PERIRECTAL ABSCESS Right 08/18/2013   Procedure: IRRIGATION AND DEBRIDEMENT GLUTEAL ABSCESS;  Surgeon: Lynda Leos, MD;  Location: [facility] OR;  Service: General;  Laterality: Right;   INCISION AND DRAINAGE PERIRECTAL ABSCESS Right 09/19/2013   Procedure: IRRIGATION AND DEBRIDEMENT RIGHT GLUTEAL AND LABIAL ABSCESSES;  Surgeon: Lynda Leos, MD;  Location: [facility] OR;  Service: General;  Laterality: Right;   INCISION AND DRAINAGE PERIRECTAL ABSCESS Right 09/24/2013   Procedure: IRRIGATION AND DEBRIDEMENT PERIRECTAL ABSCESS;  Surgeon: Lynwood MALVA Pina, MD;  Location: [facility] OR;  Service: General;  Laterality:  Right;   IR PARACENTESIS  08/28/2023   IR PARACENTESIS  11/04/2023    Allergies:  Allergies  Allergen Reactions   Cephalexin  Anaphylaxis and Other (See Comments)    Has gotten ceftriaxone  in the past    Doxycycline  Anaphylaxis   Penicillins Anaphylaxis, Hives and Rash    Per patient her reaction is anaphylactic.    Benadryl  [Diphenhydramine ] Itching   Dilaudid  [Hydromorphone ] Itching   Methotrexate Derivatives Rash   Oxycodone  Itching    Medications:   Prior to Admission medications   Medication Sig Start Date End Date Taking? Authorizing Provider  albuterol  (VENTOLIN  HFA) 108 (90 Base) MCG/ACT inhaler Inhale 2 puffs into the lungs every 4 (four) hours as needed for wheezing or shortness of breath. 11/24/22   [provider]  atorvastatin  (LIPITOR ) 80 MG tablet Take 1 tablet (80 mg total) by mouth daily. Patient not taking: Reported on 11/26/2023 10/15/23   Jonel Lonni SQUIBB, MD  bumetanide  (BUMEX ) 2 MG tablet Take 10 mg by mouth daily.    [provider]  calcitRIOL  (ROCALTROL ) 0.25 MCG capsule Take 5 capsules (1.25 mcg total) by mouth every Tuesday, Thursday, and Saturday at 6 PM. Patient taking differently: Take 1.25 mcg by mouth every Monday, Wednesday, and Friday. 07/09/23   Cindy Garnette POUR, MD  carvedilol  (COREG ) 25 MG tablet Take 25 mg by mouth 2 (two) times daily with a meal.    [provider]  dicyclomine  (BENTYL ) 20 MG tablet Take 20 mg by mouth 2 (two) times daily as needed for spasms. Patient not taking: Reported on 12/20/2023    [provider]  DULoxetine  (CYMBALTA ) 20 MG capsule Take 20 mg by mouth 2 (two) times daily. 07/29/23   [provider]  famotidine  (PEPCID ) 20 MG tablet Take 20 mg by mouth daily before breakfast. 07/29/23   [provider]  fluticasone  (FLONASE ) 50 MCG/ACT nasal spray Place 2 sprays into both nostrils daily. 12/11/23 12/10/24  [provider]  Glucagon  (BAQSIMI  ONE PACK) 3 MG/DOSE POWD  3 mg. 12/09/23   [provider]  HYDROmorphone  (DILAUDID ) 2 MG tablet Take  2 mg by mouth every 4 (four) hours as needed for severe pain (pain score 7-10).    [provider]  hydrOXYzine  (ATARAX ) 25 MG tablet Take 25 mg by mouth 3 (three) times daily as needed for anxiety, itching, nausea or vomiting.    [provider]  insulin  aspart (NOVOLOG ) 100 UNIT/ML injection Inject 5 Units into the skin 3 (three) times daily with meals.    [provider]  lamoTRIgine  (LAMICTAL ) 200 MG tablet Take 1 tablet (200 mg total) by mouth daily. 10/29/23   Regalado, Belkys A, MD  LANTUS  SOLOSTAR 100 UNIT/ML Solostar Pen Inject 10 Units into the skin in the morning.    [provider]  losartan  (COZAAR ) 100 MG tablet Take 100 mg by mouth daily. 03/25/23   [provider]  OLANZapine  zydis (ZYPREXA ) 5 MG disintegrating tablet Take 5 mg by mouth at bedtime. 09/23/23 12/22/23  [provider]  omeprazole  (PRILOSEC) 40 MG capsule Take 40 mg by mouth daily. 12/11/23 02/09/24  [provider]  ondansetron  (ZOFRAN -ODT) 4 MG disintegrating tablet Take 1 tablet (4 mg total) by mouth every 8 (eight) hours as needed for nausea or vomiting. 06/23/23   Davis, Jonathon H, MD  sodium bicarbonate  650 MG tablet Take 1,300 mg by mouth 2 (two) times daily. 07/29/23   [provider]  SUMAtriptan  (IMITREX ) 50 MG tablet Take 50 mg by mouth every 2 (two) hours as needed for migraine or headache. 06/20/22   [provider]    Discontinued Meds:   Medications Discontinued During This Encounter  Medication Reason   ondansetron  (ZOFRAN ) injection 4 mg    insulin  glargine-yfgn (SEMGLEE ) injection 10 Units    HYDROmorphone  (DILAUDID ) tablet 2 mg    insulin  glargine (LANTUS ) injection 10 Units    insulin  regular, human (MYXREDLIN ) 100 units/ 100 mL infusion    insulin  regular, human (MYXREDLIN ) 100 units/ 100 mL infusion    lactated ringers  infusion     dextrose  5 % in lactated ringers  infusion     Social History:  reports that she has never smoked. She has never been exposed to tobacco smoke. She has never used smokeless tobacco. She reports that she does not currently use alcohol. She reports that she does not use drugs.  Family History:   Family History  Problem Relation Age of Onset   Asthma Mother    Carpal tunnel syndrome Mother    Gout Father    Diabetes Paternal Grandmother    Anesthesia problems Neg Hx     Blood pressure (!) 194/100, pulse 85, temperature 98.5 F (36.9 C), resp. rate 12, height 5' 3 (1.6 m), weight 72.5 kg, SpO2 99%. Physical Exam: Gen alert, no distress No rash, cyanosis or gangrene Sclera anicteric, throat clear  No jvd or bruits Chest clear bilat to bases, no rales/ wheezing RRR no MRG Abd soft ntnd no mass 1-2+ ascites +bs GU deferred MS no joint effusions or deformity Ext trace LE or UE edema, no other edema Neuro is alert, Ox 3 , nf RUE AVG + bruit     Hudsyn Barich, LYNWOOD ORN, MD 12/23/2023, 10:11 AM

## 2023-12-23 NOTE — Inpatient Diabetes Management (Signed)
 Inpatient Diabetes Program Recommendations  AACE/ADA: New Consensus Statement on Inpatient Glycemic Control (2015)  Target Ranges:  Prepandial:   less than 140 mg/dL      Peak postprandial:   less than 180 mg/dL (1-2 hours)      Critically ill patients:  140 - 180 mg/dL    Latest Reference Range & Units 12/22/23 10:29  Glucose 70 - 99 mg/dL 383 (HH)  (HH): Data is critically high  Latest Reference Range & Units 05/21/23 05:27 08/27/23 05:26 11/18/23 06:30 12/20/23 03:05  Hemoglobin A1C 4.8 - 5.6 % 10.6 (H) 10.3 (H) 11.2 (H) 10.8 (H)  (H): Data is abnormally high  Latest Reference Range & Units 12/22/23 10:15 12/22/23 12:29 12/22/23 18:04 12/22/23 20:09 12/22/23 21:16 12/22/23 22:16  Glucose-Capillary 70 - 99 mg/dL >399 (HH) 454 (HH) 471 (HH) 416 (H) 385 (H)  IV Insulin  Drip Started 332 (H)    Latest Reference Range & Units 12/22/23 23:18 12/23/23 00:23 12/23/23 01:35 12/23/23 03:16 12/23/23 03:41  Glucose-Capillary 70 - 99 mg/dL 761 (H) 856 (H)  IV Insulin  Drip Stopped 91 54 (L) 167 (H)  1 units Novolog  @0500      Admit with:  Volume overload 2/2 ESRD and HFrEF R pleural effusion Hyperglycemia  History: Type 1 Diabetes, ESRD, Frequent DKA, Bipolar d/o  Home DM Meds: Novolog  5 units TID       Lantus  10 units daily  Current Orders: Lantus  10 units daily     Novolog  0-6 units Q4 hours     Well known to the inpatient diabetes team Multiple admissions  Leaves AMA sometimes Patient has had 14 admits in 2025 so far and has been seen multiple times by DM coordinator.  Unsure how to help patient with her DM.  Needs close follow-up with PCP and endocrinology.    --Will follow patient during hospitalization--  Adina Rudolpho Arrow RN, MSN, CDCES Diabetes Coordinator Inpatient Glycemic Control Team Team Pager: [phone] (8a-5p)

## 2023-12-23 NOTE — Progress Notes (Signed)
 Pt receives OP HD TTS via [employer]. Will assist as needed.   [name] Dialysis Navigator [phone]

## 2023-12-23 NOTE — Progress Notes (Signed)
 PROGRESS NOTE    Ashley Freeman  FMW:[id number] DOB: Jan 17, 1991 DOA: 12/22/2023 PCP: Ashley Crumbly Pap, MD  Chief Complaint  Patient presents with   Hyperglycemia    Brief Narrative:   19F h/o ESRD HD TTS, HFrEF (EF 30-35% in 08/2023), DM1, bipolar disorder, and frequent admission for DKA/volume overload p/w hyperglycemia and SOB iso volume overload 2/2 ADHF and ESRD.   Assessment & Plan:   Active Problems:   DKA (diabetic ketoacidosis) (HCC)   Volume overload 2/2 ESRD R pleural effusion Volume per renal with dialysis Bumex  on hold Calcitriol , bicarb  Moderate ascites IR c/s for paracentesis    HHS  Hyperglycemia  T1DM  Insulin  gtt now d/c'd Having recurrent hypoglycemia now - start D10 + q4 SSI   Abdominal Pain  Related to ascites, could also be Ashley Freeman component of diabetic gastroparesis Will trial reglan   Continue creon   CT with ascites/anasarca    HFrEF Volume per renal with dialysis Entresto on hold Continue coreg   HTN Coreg , losartan   Bipolar disorder -PTA Zyprexa , duloxetine  and lamictal   GERD PPI  Her medications seem wrong - has losartan  and entresto - unclear if she's taking lamictal  (discussing with pharmacy to make sure we clarify this vs whether we need to uptitrate again)    DVT prophylaxis: heparin  Code Status: full Family Communication: none Disposition:   Status is: Inpatient Remains inpatient appropriate because: need for continued inpatient care   Consultants:  Renal  IR  Procedures:  none  Antimicrobials:  Anti-infectives (From admission, onward)    None       Subjective: C/o abdominal pain   Objective: Vitals:   12/23/23 1030 12/23/23 1100 12/23/23 1104 12/23/23 1105  BP: (!) 187/102 (!) 193/111 (!) 190/109 (!) 188/104  Pulse: 87 82 80 82  Resp: 15  14 14   Temp:    (!) 97.4 F (36.3 C)  TempSrc:      SpO2: 100% 99% 100% 100%  Weight:    67.5 kg  Height:        Intake/Output Summary (Last 24  hours) at 12/23/2023 1356 Last data filed at 12/23/2023 1105 Gross per 24 hour  Intake 138.59 ml  Output 5000 ml  Net -4861.41 ml   Filed Weights   12/23/23 0500 12/23/23 0735 12/23/23 1105  Weight: 85 kg 72.5 kg 67.5 kg    Examination:  General exam: Appears calm and comfortable  Respiratory system: unlabored Cardiovascular system: RRR Gastrointestinal system: distended abdomen, mildly diffusely ttp  Central nervous system: Alert and oriented. No focal neurological deficits. Extremities: no LEE   Data Reviewed: I have personally reviewed following labs and imaging studies  CBC: Recent Labs  Lab 12/19/23 2216 12/20/23 0726 12/22/23 1029 12/22/23 1036 12/22/23 1156 12/22/23 1235 12/22/23 1942  WBC 7.7 11.4* 10.7*  --   --  11.6* 11.7*  NEUTROABS 4.4  --  6.1  --   --   --  7.4  HGB 11.1* 9.9* 8.3* 9.5* 9.2* 8.1* 8.5*  HCT 36.5 32.5* 27.3* 28.0* 27.0* 26.7* 27.4*  MCV 89.7 89.5 90.4  --   --  89.9 88.1  PLT 284 352 291  --   --  324 317    Basic Metabolic Panel: Recent Labs  Lab 12/22/23 1029 12/22/23 1036 12/22/23 1156 12/22/23 1235 12/22/23 1550 12/22/23 1942 12/23/23 0136  NA 128* 126* 127* 128* 129* 130* 133*  K 4.9 4.9 4.9 5.1 4.8 4.6 4.2  CL 94* 94*  --  92* 92* 92* 93*  CO2 18*  --   --  20* 17* 21* 22  GLUCOSE 616* 607*  --  580* 485* 454* 69*  BUN 46* 50*  --  44* 46* 46* 46*  CREATININE 7.33* 7.10*  --  7.38* 7.28* 7.51* 7.37*  CALCIUM  8.6*  --   --  8.6* 8.6* 8.7* 8.8*    GFR: Estimated Creatinine Clearance: 10.2 mL/min (Ashley Freeman) (by C-G formula based on SCr of 7.37 mg/dL (H)).  Liver Function Tests: Recent Labs  Lab 12/19/23 2216 12/22/23 1029  AST 34 19  ALT 36 28  ALKPHOS 715* 626*  BILITOT 1.2 1.0  PROT 5.5* 5.6*  ALBUMIN  2.6* 2.5*    CBG: Recent Labs  Lab 12/23/23 0135 12/23/23 0316 12/23/23 0341 12/23/23 1144 12/23/23 1159  GLUCAP 91 54* 167* 17* 173*     No results found for this or any previous visit (from the past 240  hours).       Radiology Studies: CT ABDOMEN PELVIS WO CONTRAST Result Date: 12/22/2023 CLINICAL DATA:  Abdominal pain EXAM: CT ABDOMEN AND PELVIS WITHOUT CONTRAST TECHNIQUE: Multidetector CT imaging of the abdomen and pelvis was performed following the standard protocol without IV contrast. RADIATION DOSE REDUCTION: This exam was performed according to the departmental dose-optimization program which includes automated exposure control, adjustment of the mA and/or kV according to patient size and/or use of iterative reconstruction technique. COMPARISON:  CT abdomen pelvis dated 11/06/2023. FINDINGS: Evaluation of this exam is limited in the absence of intravenous contrast. Lower chest: Partially visualized small right pleural effusion with partial compressive atelectasis of the right lung base. There is mild cardiomegaly. There is hypoattenuation of the cardiac blood pool suggestive of anemia. Clinical correlation is recommended. No intra-abdominal free air.  Moderate ascites. Hepatobiliary: Mildly enlarged liver measuring 17 cm in midclavicular length. No biliary dilatation. Cholecystectomy. Pancreas: The pancreas is mildly atrophic for age. No dilatation of the main pancreatic duct Spleen: Normal in size without focal abnormality. Adrenals/Urinary Tract: The adrenal glands are unremarkable. Mild bilateral renal parenchyma atrophy. There is no hydronephrosis or nephrolithiasis on either side. The visualized ureters and urinary bladder appear unremarkable. Stomach/Bowel: There is no bowel obstruction. The appendix is normal. Vascular/Lymphatic: The abdominal aorta and IVC are unremarkable. Atherosclerotic calcification of the mesenteric arteries. No portal venous gas. No obvious adenopathy. Reproductive: The uterus is grossly unremarkable. No suspicious adnexal masses. Other: Diffuse subcutaneous edema and anasarca. Musculoskeletal: No acute or significant osseous findings. IMPRESSION: 1. Moderate ascites  and anasarca, progressed since the prior CT. 2. No bowel obstruction. Normal appendix. 3. Partially visualized small right pleural effusion with partial compressive atelectasis of the right lung base. 4. Mild cardiomegaly. Electronically Signed   By: Ashley Freeman M.D.   On: 12/22/2023 12:42   DG Chest Portable 1 View Result Date: 12/22/2023 CLINICAL DATA:  Shortness of breath.  Hyperglycemia. EXAM: PORTABLE CHEST 1 VIEW COMPARISON:  12/19/2023 FINDINGS: Heart size is mildly enlarged, likely exaggerated by low lung volumes. Mild linear opacity in the right lung base is new and consistent with mild subsegmental atelectasis. No No evidence of focal consolidation or pleural effusion. IMPRESSION: Low lung volumes with mild right basilar subsegmental atelectasis. Electronically Signed   By: Norleen DELENA Kil M.D.   On: 12/22/2023 11:12        Scheduled Meds:  calcitRIOL   1.25 mcg Oral Q M,W,F   carvedilol   25 mg Oral BID WC   Chlorhexidine  Gluconate Cloth  6 each Topical Q0600   dextrose   2 Tube  Oral STAT   DULoxetine   20 mg Oral BID   heparin   5,000 Units Subcutaneous Q8H   insulin  aspart  0-5 Units Subcutaneous QHS   insulin  aspart  0-6 Units Subcutaneous Q4H   insulin  aspart  0-6 Units Subcutaneous TID WC   lamoTRIgine   200 mg Oral Daily   lipase/protease/amylase  12,000 Units Oral BID AC   losartan   100 mg Oral Daily   metoCLOPramide  (REGLAN ) injection  5 mg Intravenous Q6H   OLANZapine  zydis  5 mg Oral QHS   pantoprazole   40 mg Oral Daily   sodium bicarbonate   1,300 mg Oral BID   Continuous Infusions:   LOS: 1 day    Time spent: over 30 min    Meliton Monte, MD [facility]   To contact the attending provider between 7A-7P or the covering provider during after hours 7P-7A, please log into the web site [email or web address] and access using universal [place] password for that web site. If you do not have the password, please call the hospital operator.  12/23/2023, 1:56 PM

## 2023-12-23 NOTE — Progress Notes (Signed)
       Overnight   NAME: Ashley Freeman MRN: [id number] DOB : Jul 23, 1990    Date of Service   12/23/2023   HPI/Events of Note    Notified by RN for Hypoglycemic event , covered by RN and protocol, also 1 IV access with inability to achieve a second access by US .  HD TTS.  Transitioned off Endotool in light of Lab results and weighing the above. Consulted with floor coverage for opinion.    Interventions/ Plan   Stop Endotool and drip(s)  Treat Hypoglycemia with standing protocol. Utilize Every 4 hour CBG s with Sliding scale coverage  Follow up lab draw per Rounding Physician discretion.       Lynwood Kipper BSN MSNA MSN ACNPC-AG Acute Care Nurse Practitioner [facility]

## 2023-12-23 NOTE — Plan of Care (Signed)
  Problem: Coping: Goal: Ability to adjust to condition or change in health will improve Outcome: Progressing   Problem: Fluid Volume: Goal: Ability to maintain a balanced intake and output will improve Outcome: Progressing   Problem: Nutritional: Goal: Maintenance of adequate nutrition will improve Outcome: Progressing

## 2023-12-24 DIAGNOSIS — R112 Nausea with vomiting, unspecified: Secondary | ICD-10-CM | POA: Diagnosis not present

## 2023-12-24 DIAGNOSIS — R739 Hyperglycemia, unspecified: Secondary | ICD-10-CM | POA: Diagnosis not present

## 2023-12-24 LAB — COMPREHENSIVE METABOLIC PANEL WITH GFR
ALT: 20 U/L (ref 0–44)
AST: 18 U/L (ref 15–41)
Albumin: 2.2 g/dL — ABNORMAL LOW (ref 3.5–5.0)
Alkaline Phosphatase: 486 U/L — ABNORMAL HIGH (ref 38–126)
Anion gap: 12 (ref 5–15)
BUN: 29 mg/dL — ABNORMAL HIGH (ref 6–20)
CO2: 23 mmol/L (ref 22–32)
Calcium: 8.2 mg/dL — ABNORMAL LOW (ref 8.9–10.3)
Chloride: 98 mmol/L (ref 98–111)
Creatinine, Ser: 5.78 mg/dL — ABNORMAL HIGH (ref 0.44–1.00)
GFR, Estimated: 9 mL/min — ABNORMAL LOW (ref >60)
Glucose, Bld: 84 mg/dL (ref 70–99)
Potassium: 4.3 mmol/L (ref 3.5–5.1)
Sodium: 133 mmol/L — ABNORMAL LOW (ref 135–145)
Total Bilirubin: 0.7 mg/dL (ref 0.0–1.2)
Total Protein: 4.7 g/dL — ABNORMAL LOW (ref 6.5–8.1)

## 2023-12-24 LAB — CBC WITH DIFFERENTIAL/PLATELET
Abs Immature Granulocytes: 0.07 K/uL (ref 0.00–0.07)
Basophils Absolute: 0.1 K/uL (ref 0.0–0.1)
Basophils Relative: 1 %
Eosinophils Absolute: 0.7 K/uL — ABNORMAL HIGH (ref 0.0–0.5)
Eosinophils Relative: 8 %
HCT: 25.8 % — ABNORMAL LOW (ref 36.0–46.0)
Hemoglobin: 8.1 g/dL — ABNORMAL LOW (ref 12.0–15.0)
Immature Granulocytes: 1 %
Lymphocytes Relative: 35 %
Lymphs Abs: 3.2 K/uL (ref 0.7–4.0)
MCH: 27.6 pg (ref 26.0–34.0)
MCHC: 31.4 g/dL (ref 30.0–36.0)
MCV: 87.8 fL (ref 80.0–100.0)
Monocytes Absolute: 0.8 K/uL (ref 0.1–1.0)
Monocytes Relative: 9 %
Neutro Abs: 4.4 K/uL (ref 1.7–7.7)
Neutrophils Relative %: 46 %
Platelets: 299 K/uL (ref 150–400)
RBC: 2.94 MIL/uL — ABNORMAL LOW (ref 3.87–5.11)
RDW: 16.7 % — ABNORMAL HIGH (ref 11.5–15.5)
WBC: 9.1 K/uL (ref 4.0–10.5)
nRBC: 0 % (ref 0.0–0.2)

## 2023-12-24 LAB — GLUCOSE, CAPILLARY
Glucose-Capillary: 146 mg/dL — ABNORMAL HIGH (ref 70–99)
Glucose-Capillary: 256 mg/dL — ABNORMAL HIGH (ref 70–99)
Glucose-Capillary: 86 mg/dL (ref 70–99)
Glucose-Capillary: 93 mg/dL (ref 70–99)

## 2023-12-24 LAB — PHOSPHORUS: Phosphorus: 5.8 mg/dL — ABNORMAL HIGH (ref 2.5–4.6)

## 2023-12-24 LAB — PATHOLOGIST SMEAR REVIEW

## 2023-12-24 LAB — MAGNESIUM: Magnesium: 2 mg/dL (ref 1.7–2.4)

## 2023-12-24 MED ORDER — LAMOTRIGINE 100 MG PO TABS: 100.0000 mg | ORAL_TABLET | Freq: Two times a day (BID) | ORAL | Status: DC

## 2023-12-24 MED ORDER — METOCLOPRAMIDE HCL 5 MG/ML IJ SOLN
5.0000 mg | Freq: Four times a day (QID) | INTRAMUSCULAR | Status: DC
  Administered 2023-12-24: 5 mg via INTRAVENOUS
  Filled 2023-12-24: qty 2

## 2023-12-24 MED ORDER — LAMOTRIGINE 25 MG PO TABS: 25.0000 mg | ORAL_TABLET | Freq: Every day | ORAL | Status: DC

## 2023-12-24 MED ORDER — LAMOTRIGINE 25 MG PO TABS: 50.0000 mg | ORAL_TABLET | Freq: Two times a day (BID) | ORAL | Status: DC

## 2023-12-24 MED ORDER — LAMOTRIGINE 25 MG PO TABS: 25.0000 mg | ORAL_TABLET | Freq: Two times a day (BID) | ORAL | Status: DC

## 2023-12-24 NOTE — Discharge Summary (Signed)
  Physician Discharge Summary   Patient: Ashley Freeman MRN: [id number] DOB: 25-Mar-1991  Admit date:     12/22/2023  Discharge date: 12/24/23  Discharge Physician: Meliton Monte   PCP: Keven Crumbly Pap, MD   Recommendations at discharge:  Patient left AMA  Discharge Diagnoses: Active Problems:   Hyperglycemia   DKA (diabetic ketoacidosis) (HCC)  Resolved Problems:   * No resolved hospital problems. [facility] Course: See previous notes, patient left AMA

## 2023-12-24 NOTE — Progress Notes (Signed)
 Patient left AMA due to child care concerns. Physician notified. AMA form signed by patient and placed in physical chart. Patient ambulated off unit to lobby elevators to be picked up by aunt.

## 2023-12-24 NOTE — Progress Notes (Addendum)
 PROGRESS NOTE    Ashley Freeman  FMW:[id number] DOB: [birth year] DOA: 12/22/2023 PCP: Keven Crumbly Pap, MD  Chief Complaint  Patient presents with   Hyperglycemia    Brief Narrative:   89F h/o ESRD HD TTS, HFrEF (EF 30-35% in 08/2023), DM1, bipolar disorder, and frequent admission for DKA/volume overload p/w hyperglycemia and SOB iso volume overload 2/2 ADHF and ESRD.   She's improved after dialysis and paracentesis.  Still with abdominal discomfort and poor PO intake.  Follow response to reglan .    Assessment & Plan:   Active Problems:   Hyperglycemia   DKA (diabetic ketoacidosis) (HCC)   Volume overload 2/2 ESRD R pleural effusion Volume per renal with dialysis - planning for HD again 8/27 Bumex  on hold Calcitriol , bicarb   HHS  Hyperglycemia  T1DM  Insulin  gtt now d/c'd Intermittent hypoglycemia on 8/25 - will see how she does with PO intake Low dose basal and SSI ordered   Abdominal Pain  Ascites Related to ascites, could also be Chyane Greer component of diabetic gastroparesis S/p para, r/o for SBP Continue reglan  Continue creon   CT with ascites/anasarca    HFrEF Volume per renal with dialysis Continue coreg   HTN Coreg   Bipolar disorder -PTA Zyprexa , duloxetine  and lamictal  (pt reports taking this, but no recent fills seen, will discuss with pharmacy - will plan to restart uptitration)  GERD PPI  Her medications seem wrong - has losartan  and entresto on med list- unclear if she's taking lamictal  (discussing with pharmacy to make sure we clarify this vs whether we need to uptitrate again) - would clarify meds at discharge.    DVT prophylaxis: heparin  Code Status: full Family Communication: none Disposition:   Status is: Inpatient Remains inpatient appropriate because: need for continued inpatient care   Consultants:  Renal  IR  Procedures:  Paracentesis 8/25  Antimicrobials:  Anti-infectives (From admission, onward)    None        Subjective: C/o early satiety, abdominal discomfort  Objective: Vitals:   12/23/23 1948 12/23/23 2304 12/24/23 0453 12/24/23 0910  BP: (!) 147/75 (!) 147/75 121/69 (!) 110/54  Pulse: 83 85 79 80  Resp: 14 14 16 18   Temp: 98.4 F (36.9 C) 98.3 F (36.8 C) 98.3 F (36.8 C) 98.3 F (36.8 C)  TempSrc: Oral Oral Oral Oral  SpO2: 96% 97% 94% 96%  Weight:   62.9 kg   Height:        Intake/Output Summary (Last 24 hours) at 12/24/2023 1049 Last data filed at 12/23/2023 2020 Gross per 24 hour  Intake 788.31 ml  Output 5000 ml  Net -4211.69 ml   Filed Weights   12/23/23 0735 12/23/23 1105 12/24/23 0453  Weight: 72.5 kg 67.5 kg 62.9 kg    Examination:  General: No acute distress. Cardiovascular: RRR Lungs: unlabored Abdomen: distended (less than yesterday), diffuse abdominal discomfort Neurological: Alert and oriented 3. Moves all extremities 4 with equal strength. Cranial nerves II through XII grossly intact. Extremities: No clubbing or cyanosis. No edema.   Data Reviewed: I have personally reviewed following labs and imaging studies  CBC: Recent Labs  Lab 12/19/23 2216 12/20/23 0726 12/22/23 1029 12/22/23 1036 12/22/23 1156 12/22/23 1235 12/22/23 1942 12/24/23 0459  WBC 7.7 11.4* 10.7*  --   --  11.6* 11.7* 9.1  NEUTROABS 4.4  --  6.1  --   --   --  7.4 4.4  HGB 11.1* 9.9* 8.3* 9.5* 9.2* 8.1* 8.5* 8.1*  HCT 36.5 32.5* 27.3* 28.0*  27.0* 26.7* 27.4* 25.8*  MCV 89.7 89.5 90.4  --   --  89.9 88.1 87.8  PLT 284 352 291  --   --  324 317 299    Basic Metabolic Panel: Recent Labs  Lab 12/22/23 1550 12/22/23 1942 12/23/23 0136 12/23/23 1706 12/24/23 0459  NA 129* 130* 133* 133* 133*  K 4.8 4.6 4.2 4.8 4.3  CL 92* 92* 93* 94* 98  CO2 17* 21* 22 22 23   GLUCOSE 485* 454* 69* 274* 84  BUN 46* 46* 46* 27* 29*  CREATININE 7.28* 7.51* 7.37* 5.11* 5.78*  CALCIUM  8.6* 8.7* 8.8* 8.8* 8.2*  MG  --   --   --   --  2.0  PHOS  --   --   --   --  5.8*     GFR: Estimated Creatinine Clearance: 12.6 mL/min (Britiny Defrain) (by C-G formula based on SCr of 5.78 mg/dL (H)).  Liver Function Tests: Recent Labs  Lab 12/19/23 2216 12/22/23 1029 12/23/23 1706 12/24/23 0459  AST 34 19  --  18  ALT 36 28  --  20  ALKPHOS 715* 626*  --  486*  BILITOT 1.2 1.0  --  0.7  PROT 5.5* 5.6*  --  4.7*  ALBUMIN  2.6* 2.5* 2.6* 2.2*    CBG: Recent Labs  Lab 12/23/23 1702 12/23/23 2117 12/24/23 0026 12/24/23 0456 12/24/23 0801  GLUCAP 269* 268* 256* 86 146*     Recent Results (from the past 240 hours)  Gram stain     Status: None   Collection Time: 12/23/23  4:20 PM   Specimen: Abdomen; Peritoneal Fluid  Result Value Ref Range Status   Specimen Description PERITONEAL  Final   Special Requests NONE  Final   Gram Stain   Final    WBC PRESENT, PREDOMINANTLY PMN NO ORGANISMS SEEN CYTOSPIN SMEAR Performed at [facility], [address] [place], [place] [address]    Report Status 12/23/2023 FINAL  Final  Culture, body fluid w Gram Stain-bottle     Status: None (Preliminary result)   Collection Time: 12/23/23  4:20 PM   Specimen: Peritoneal Washings  Result Value Ref Range Status   Specimen Description PERITONEAL  Final   Special Requests NONE  Final   Culture   Final    NO GROWTH < 24 HOURS Performed at [facility], [address] [place], [place] [address]    Report Status PENDING  Incomplete         Radiology Studies: IR Paracentesis Result Date: 12/24/2023 INDICATION: 33 year old female with end-stage renal disease and recurrent ascites for diagnostic and therapeutic paracentesis. EXAM: ULTRASOUND GUIDED RIGHT PARACENTESIS MEDICATIONS: 1% lidocaine  10 mL COMPLICATIONS: None immediate. PROCEDURE: Informed written consent was obtained from the patient after Amarya Kuehl discussion of the risks, benefits and alternatives to treatment. Jessicah Croll timeout was performed prior to the initiation of the procedure. Initial ultrasound scanning demonstrates  Nichalas Coin large amount of ascites within the right lower abdominal quadrant. The right lower abdomen was prepped and draped in the usual sterile fashion. 1% lidocaine  was used for local anesthesia. Following this, Jaquasia Doscher 19 gauge, 7-cm, Yueh catheter was introduced. An ultrasound image was saved for documentation purposes. The paracentesis was performed. The catheter was removed and Aleiah Mohammed dressing was applied. The patient tolerated the procedure well without immediate post procedural complication. FINDINGS: Cyruss Arata total of approximately 5 L of clear, yellow fluid was removed. Samples were sent to the laboratory as requested by the clinical  team. IMPRESSION: Successful ultrasound-guided paracentesis yielding 5 liters of peritoneal fluid. PLAN: Performed By Lavanda Jurist, PA-C Electronically Signed   By: CHRISTELLA.  Shick M.D.   On: 12/24/2023 10:04   CT ABDOMEN PELVIS WO CONTRAST Result Date: 12/22/2023 CLINICAL DATA:  Abdominal pain EXAM: CT ABDOMEN AND PELVIS WITHOUT CONTRAST TECHNIQUE: Multidetector CT imaging of the abdomen and pelvis was performed following the standard protocol without IV contrast. RADIATION DOSE REDUCTION: This exam was performed according to the departmental dose-optimization program which includes automated exposure control, adjustment of the mA and/or kV according to patient size and/or use of iterative reconstruction technique. COMPARISON:  CT abdomen pelvis dated 11/06/2023. FINDINGS: Evaluation of this exam is limited in the absence of intravenous contrast. Lower chest: Partially visualized small right pleural effusion with partial compressive atelectasis of the right lung base. There is mild cardiomegaly. There is hypoattenuation of the cardiac blood pool suggestive of anemia. Clinical correlation is recommended. No intra-abdominal free air.  Moderate ascites. Hepatobiliary: Mildly enlarged liver measuring 17 cm in midclavicular length. No biliary dilatation. Cholecystectomy. Pancreas: The pancreas is mildly  atrophic for age. No dilatation of the main pancreatic duct Spleen: Normal in size without focal abnormality. Adrenals/Urinary Tract: The adrenal glands are unremarkable. Mild bilateral renal parenchyma atrophy. There is no hydronephrosis or nephrolithiasis on either side. The visualized ureters and urinary bladder appear unremarkable. Stomach/Bowel: There is no bowel obstruction. The appendix is normal. Vascular/Lymphatic: The abdominal aorta and IVC are unremarkable. Atherosclerotic calcification of the mesenteric arteries. No portal venous gas. No obvious adenopathy. Reproductive: The uterus is grossly unremarkable. No suspicious adnexal masses. Other: Diffuse subcutaneous edema and anasarca. Musculoskeletal: No acute or significant osseous findings. IMPRESSION: 1. Moderate ascites and anasarca, progressed since the prior CT. 2. No bowel obstruction. Normal appendix. 3. Partially visualized small right pleural effusion with partial compressive atelectasis of the right lung base. 4. Mild cardiomegaly. Electronically Signed   By: Vanetta Chou M.D.   On: 12/22/2023 12:42        Scheduled Meds:  calcitRIOL   1.25 mcg Oral Q M,W,F   carvedilol   25 mg Oral BID WC   Chlorhexidine  Gluconate Cloth  6 each Topical Q0600   dextrose   2 Tube Oral STAT   DULoxetine   20 mg Oral BID   heparin   5,000 Units Subcutaneous Q8H   insulin  aspart  0-6 Units Subcutaneous Q4H   insulin  glargine  5 Units Subcutaneous Daily   lamoTRIgine   200 mg Oral Daily   lidocaine  (PF)  10 mL Infiltration Once   lipase/protease/amylase  12,000 Units Oral BID AC   losartan   100 mg Oral Daily   metoCLOPramide  (REGLAN ) injection  5 mg Intravenous Q6H   OLANZapine  zydis  5 mg Oral QHS   pantoprazole   40 mg Oral Daily   sodium bicarbonate   1,300 mg Oral BID   Continuous Infusions:  dextrose  50 mL/hr at 12/23/23 1800     LOS: 2 days    Time spent: over 30 min    Meliton Monte, MD [facility]   To contact the  attending provider between 7A-7P or the covering provider during after hours 7P-7A, please log into the web site [email or web address] and access using universal [place] password for that web site. If you do not have the password, please call the hospital operator.  12/24/2023, 10:49 AM

## 2023-12-24 NOTE — Progress Notes (Signed)
 [facility] Progress Note   Subjective:   Patient seen and examined in her room this morning. She was sleepy, but reports that she is feeling better this morning. She had HD 12/23/23 and we were able to remove 5L via UF. She also had a paracentesis and they removed 5L from her abdomen. She endorses some nausea this morning. She is off her insulin  gtt. BP is better today after UF. Next HD 12/25/23  Objective Vitals:   12/23/23 1948 12/23/23 2304 12/24/23 0453 12/24/23 0910  BP: (!) 147/75 (!) 147/75 121/69 (!) 110/54  Pulse: 83 85 79 80  Resp: 14 14 16 18   Temp: 98.4 F (36.9 C) 98.3 F (36.8 C) 98.3 F (36.8 C) 98.3 F (36.8 C)  TempSrc: Oral Oral Oral Oral  SpO2: 96% 97% 94% 96%  Weight:   62.9 kg   Height:       Physical Exam: Gen alert, no distress No rash, cyanosis or gangrene Sclera anicteric, throat clear  No jvd or bruits Chest clear bilat to bases, no rales/ wheezing RRR no MRG Abd soft ntnd no mass 1+ ascites +bs GU deferred MS no joint effusions or deformity Ext trace LE or UE edema, no other edema Neuro is alert, Ox 3 , nf RUE AVG + bruit  Additional Objective Labs: Basic Metabolic Panel: Recent Labs  Lab 12/23/23 0136 12/23/23 1706 12/24/23 0459  NA 133* 133* 133*  K 4.2 4.8 4.3  CL 93* 94* 98  CO2 22 22 23   GLUCOSE 69* 274* 84  BUN 46* 27* 29*  CREATININE 7.37* 5.11* 5.78*  CALCIUM  8.8* 8.8* 8.2*  PHOS  --   --  5.8*   Liver Function Tests: Recent Labs  Lab 12/19/23 2216 12/22/23 1029 12/23/23 1706 12/24/23 0459  AST 34 19  --  18  ALT 36 28  --  20  ALKPHOS 715* 626*  --  486*  BILITOT 1.2 1.0  --  0.7  PROT 5.5* 5.6*  --  4.7*  ALBUMIN  2.6* 2.5* 2.6* 2.2*   No results for input(s): LIPASE, AMYLASE in the last 168 hours. CBC: Recent Labs  Lab 12/20/23 0726 12/22/23 1029 12/22/23 1036 12/22/23 1235 12/22/23 1942 12/24/23 0459  WBC 11.4* 10.7*  --  11.6* 11.7* 9.1  NEUTROABS  --  6.1  --   --  7.4 4.4  HGB  9.9* 8.3*   < > 8.1* 8.5* 8.1*  HCT 32.5* 27.3*   < > 26.7* 27.4* 25.8*  MCV 89.5 90.4  --  89.9 88.1 87.8  PLT 352 291  --  324 317 299   < > = values in this interval not displayed.    CBG: Recent Labs  Lab 12/23/23 1702 12/23/23 2117 12/24/23 0026 12/24/23 0456 12/24/23 0801  GLUCAP 269* 268* 256* 86 146*    Medications:  dextrose  50 mL/hr at 12/23/23 1800    calcitRIOL   1.25 mcg Oral Q M,W,F   carvedilol   25 mg Oral BID WC   Chlorhexidine  Gluconate Cloth  6 each Topical Q0600   dextrose   2 Tube Oral STAT   DULoxetine   20 mg Oral BID   heparin   5,000 Units Subcutaneous Q8H   insulin  aspart  0-6 Units Subcutaneous Q4H   insulin  glargine  5 Units Subcutaneous Daily   lamoTRIgine   200 mg Oral Daily   lidocaine  (PF)  10 mL Infiltration Once   lipase/protease/amylase  12,000 Units Oral BID AC   losartan   100 mg Oral  Daily   metoCLOPramide  (REGLAN ) injection  10 mg Intravenous Q6H   OLANZapine  zydis  5 mg Oral QHS   pantoprazole   40 mg Oral Daily   sodium bicarbonate   1,300 mg Oral BID   Home bp meds: Bumex  10mg  every day Coreg  25 bid Losartan  100 qd    OP HD: MWF [facility] [address] from 11/09/23 -> 3h  60kg  B350   AVG  Hep 1600 + 600 u/hr   Assessment/Plan: Hypervolemia: With abdominal pain and distention, possible early pulmonary edema as well.  Patient is getting dialysis this morning for volume overload with 4 to 5 L goal. ESRD: on HD MWF. Had HD 12/23/23 and we yielded 5L via UF. 3K bath 5 L net UF goal. BP better controlled today. K at goal. Next HD 12/25/23   HTN: BP better controlled today. Volume: vol excess, ultrafiltration tomorrow should help. She is still above her EDW but we can get her close to EDW before d/c. Na level 133 indicating excess volume.  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  Ascites - Had Paracentesis 12/23/23 and they sucessfully drained 5L off. Still with minimal ascites this morning but she reports that she is feeling better.   DM On insulin  therapy per primary. Has been boarder-line hypoglycemic today. On low dose dextrose .  Bipolar disorder -being managed by the primary   Ashley Och, NP 12/24/2023, 10:28 AM  [facility]

## 2023-12-25 NOTE — Progress Notes (Signed)
 Late note entry 8:58am 12/25/23 Noted pt d/c. Contacted pt out-pt HD clinic[facility] to inform of pt d/c and to anticipate back Thursday. D/c summary and last note faxed over at this time.   Eyva Califano Dialysis Navigator [phone]

## 2023-12-25 NOTE — Progress Notes (Signed)
 Late note entry 9:01, 8-27 D/c noted. Contacted pt out pt HD clinic, [facility] to inform of pt d/c and to anticipate back Thursday. D/c summary and last note faxed over.   [facility] Trecia Maring Dialysis Naviagtor [phone]

## 2023-12-28 LAB — CULTURE, BODY FLUID W GRAM STAIN -BOTTLE: Culture: NO GROWTH

## 2023-12-29 ENCOUNTER — Encounter (HOSPITAL_COMMUNITY): Payer: Self-pay

## 2023-12-29 ENCOUNTER — Other Ambulatory Visit: Payer: Self-pay

## 2023-12-29 ENCOUNTER — Inpatient Hospital Stay (HOSPITAL_COMMUNITY)
Admission: EM | Admit: 2023-12-29 | Discharge: 2023-12-31 | DRG: 637 | Disposition: A | Discharge status: Home / Self Care | Attending: Internal Medicine | Admitting: Internal Medicine

## 2023-12-29 ENCOUNTER — Emergency Department (HOSPITAL_COMMUNITY)

## 2023-12-29 DIAGNOSIS — N289 Disorder of kidney and ureter, unspecified: Secondary | ICD-10-CM | POA: Diagnosis not present

## 2023-12-29 DIAGNOSIS — D631 Anemia in chronic kidney disease: Secondary | ICD-10-CM | POA: Diagnosis present

## 2023-12-29 DIAGNOSIS — E1010 Type 1 diabetes mellitus with ketoacidosis without coma: Principal | ICD-10-CM | POA: Diagnosis present

## 2023-12-29 DIAGNOSIS — I5042 Chronic combined systolic (congestive) and diastolic (congestive) heart failure: Secondary | ICD-10-CM | POA: Diagnosis present

## 2023-12-29 DIAGNOSIS — I5043 Acute on chronic combined systolic (congestive) and diastolic (congestive) heart failure: Secondary | ICD-10-CM | POA: Diagnosis present

## 2023-12-29 DIAGNOSIS — Z885 Allergy status to narcotic agent status: Secondary | ICD-10-CM | POA: Diagnosis not present

## 2023-12-29 DIAGNOSIS — Z833 Family history of diabetes mellitus: Secondary | ICD-10-CM

## 2023-12-29 DIAGNOSIS — Z825 Family history of asthma and other chronic lower respiratory diseases: Secondary | ICD-10-CM

## 2023-12-29 DIAGNOSIS — E871 Hypo-osmolality and hyponatremia: Secondary | ICD-10-CM | POA: Diagnosis not present

## 2023-12-29 DIAGNOSIS — Z9049 Acquired absence of other specified parts of digestive tract: Secondary | ICD-10-CM | POA: Diagnosis not present

## 2023-12-29 DIAGNOSIS — Z794 Long term (current) use of insulin: Secondary | ICD-10-CM | POA: Diagnosis not present

## 2023-12-29 DIAGNOSIS — E785 Hyperlipidemia, unspecified: Secondary | ICD-10-CM | POA: Diagnosis present

## 2023-12-29 DIAGNOSIS — R7989 Other specified abnormal findings of blood chemistry: Secondary | ICD-10-CM | POA: Diagnosis present

## 2023-12-29 DIAGNOSIS — N2581 Secondary hyperparathyroidism of renal origin: Secondary | ICD-10-CM | POA: Diagnosis present

## 2023-12-29 DIAGNOSIS — Z881 Allergy status to other antibiotic agents status: Secondary | ICD-10-CM | POA: Diagnosis not present

## 2023-12-29 DIAGNOSIS — N185 Chronic kidney disease, stage 5: Secondary | ICD-10-CM | POA: Diagnosis not present

## 2023-12-29 DIAGNOSIS — Z880 Allergy status to penicillin: Secondary | ICD-10-CM

## 2023-12-29 DIAGNOSIS — Z79899 Other long term (current) drug therapy: Secondary | ICD-10-CM

## 2023-12-29 DIAGNOSIS — R188 Other ascites: Secondary | ICD-10-CM | POA: Diagnosis not present

## 2023-12-29 DIAGNOSIS — F319 Bipolar disorder, unspecified: Secondary | ICD-10-CM | POA: Diagnosis present

## 2023-12-29 DIAGNOSIS — E1165 Type 2 diabetes mellitus with hyperglycemia: Secondary | ICD-10-CM

## 2023-12-29 DIAGNOSIS — E8770 Fluid overload, unspecified: Secondary | ICD-10-CM

## 2023-12-29 DIAGNOSIS — Z992 Dependence on renal dialysis: Secondary | ICD-10-CM

## 2023-12-29 DIAGNOSIS — N186 End stage renal disease: Secondary | ICD-10-CM | POA: Diagnosis present

## 2023-12-29 DIAGNOSIS — R9431 Abnormal electrocardiogram [ECG] [EKG]: Secondary | ICD-10-CM | POA: Diagnosis present

## 2023-12-29 DIAGNOSIS — Z888 Allergy status to other drugs, medicaments and biological substances status: Secondary | ICD-10-CM | POA: Diagnosis not present

## 2023-12-29 DIAGNOSIS — Z91158 Patient's noncompliance with renal dialysis for other reason: Secondary | ICD-10-CM

## 2023-12-29 DIAGNOSIS — I428 Other cardiomyopathies: Secondary | ICD-10-CM | POA: Diagnosis present

## 2023-12-29 DIAGNOSIS — I10 Essential (primary) hypertension: Secondary | ICD-10-CM | POA: Diagnosis not present

## 2023-12-29 DIAGNOSIS — Z23 Encounter for immunization: Secondary | ICD-10-CM | POA: Diagnosis not present

## 2023-12-29 DIAGNOSIS — F419 Anxiety disorder, unspecified: Secondary | ICD-10-CM | POA: Diagnosis present

## 2023-12-29 DIAGNOSIS — E1159 Type 2 diabetes mellitus with other circulatory complications: Secondary | ICD-10-CM | POA: Diagnosis present

## 2023-12-29 DIAGNOSIS — R739 Hyperglycemia, unspecified: Secondary | ICD-10-CM | POA: Diagnosis present

## 2023-12-29 LAB — GLUCOSE, CAPILLARY
Glucose-Capillary: >600 mg/dL (ref 70–99)
Glucose-Capillary: >600 mg/dL (ref 70–99)
Glucose-Capillary: >600 mg/dL (ref 70–99)
Glucose-Capillary: >600 mg/dL (ref 70–99)
Glucose-Capillary: >600 mg/dL (ref 70–99)
Glucose-Capillary: >600 mg/dL (ref 70–99)
Glucose-Capillary: 501 mg/dL (ref 70–99)
Glucose-Capillary: 505 mg/dL (ref 70–99)
Glucose-Capillary: 441 mg/dL — ABNORMAL HIGH (ref 70–99)
Glucose-Capillary: 463 mg/dL — ABNORMAL HIGH (ref 70–99)
Glucose-Capillary: 479 mg/dL — ABNORMAL HIGH (ref 70–99)
Glucose-Capillary: 379 mg/dL — ABNORMAL HIGH (ref 70–99)
Glucose-Capillary: 332 mg/dL — ABNORMAL HIGH (ref 70–99)
Glucose-Capillary: 240 mg/dL — ABNORMAL HIGH (ref 70–99)
Glucose-Capillary: 114 mg/dL — ABNORMAL HIGH (ref 70–99)
Glucose-Capillary: 87 mg/dL (ref 70–99)
Glucose-Capillary: 174 mg/dL — ABNORMAL HIGH (ref 70–99)
Glucose-Capillary: 69 mg/dL — ABNORMAL LOW (ref 70–99)
Glucose-Capillary: 143 mg/dL — ABNORMAL HIGH (ref 70–99)

## 2023-12-29 LAB — I-STAT VENOUS BLOOD GAS, ED
Acid-base deficit: 8 mmol/L — ABNORMAL HIGH (ref 0.0–2.0)
Bicarbonate: 17.5 mmol/L — ABNORMAL LOW (ref 20.0–28.0)
Calcium, Ion: 0.98 mmol/L — ABNORMAL LOW (ref 1.15–1.40)
HCT: 30 % — ABNORMAL LOW (ref 36.0–46.0)
Hemoglobin: 10.2 g/dL — ABNORMAL LOW (ref 12.0–15.0)
O2 Saturation: 99 %
Potassium: 4.5 mmol/L (ref 3.5–5.1)
Sodium: 117 mmol/L — CL (ref 135–145)
TCO2: 19 mmol/L — ABNORMAL LOW (ref 22–32)
pCO2, Ven: 36.4 mmHg — ABNORMAL LOW (ref 44–60)
pH, Ven: 7.289 (ref 7.25–7.43)
pO2, Ven: 164 mmHg — ABNORMAL HIGH (ref 32–45)

## 2023-12-29 LAB — CBG MONITORING, ED
Glucose-Capillary: >600 mg/dL (ref 70–99)
Glucose-Capillary: >600 mg/dL (ref 70–99)
Glucose-Capillary: >600 mg/dL (ref 70–99)
Glucose-Capillary: >600 mg/dL (ref 70–99)
Glucose-Capillary: >600 mg/dL (ref 70–99)
Glucose-Capillary: >600 mg/dL (ref 70–99)

## 2023-12-29 LAB — BASIC METABOLIC PANEL WITH GFR
Anion gap: 19 — ABNORMAL HIGH (ref 5–15)
Anion gap: 20 — ABNORMAL HIGH (ref 5–15)
Anion gap: 20 — ABNORMAL HIGH (ref 5–15)
Anion gap: 15 (ref 5–15)
BUN: 33 mg/dL — ABNORMAL HIGH (ref 6–20)
BUN: 33 mg/dL — ABNORMAL HIGH (ref 6–20)
BUN: 36 mg/dL — ABNORMAL HIGH (ref 6–20)
BUN: 35 mg/dL — ABNORMAL HIGH (ref 6–20)
CO2: 15 mmol/L — ABNORMAL LOW (ref 22–32)
CO2: 14 mmol/L — ABNORMAL LOW (ref 22–32)
CO2: 14 mmol/L — ABNORMAL LOW (ref 22–32)
CO2: 19 mmol/L — ABNORMAL LOW (ref 22–32)
Calcium: 7.4 mg/dL — ABNORMAL LOW (ref 8.9–10.3)
Calcium: 7.5 mg/dL — ABNORMAL LOW (ref 8.9–10.3)
Calcium: 7.7 mg/dL — ABNORMAL LOW (ref 8.9–10.3)
Calcium: 7.7 mg/dL — ABNORMAL LOW (ref 8.9–10.3)
Chloride: 84 mmol/L — ABNORMAL LOW (ref 98–111)
Chloride: 86 mmol/L — ABNORMAL LOW (ref 98–111)
Chloride: 90 mmol/L — ABNORMAL LOW (ref 98–111)
Chloride: 92 mmol/L — ABNORMAL LOW (ref 98–111)
Creatinine, Ser: 6.28 mg/dL — ABNORMAL HIGH (ref 0.44–1.00)
Creatinine, Ser: 6.4 mg/dL — ABNORMAL HIGH (ref 0.44–1.00)
Creatinine, Ser: 6.3 mg/dL — ABNORMAL HIGH (ref 0.44–1.00)
Creatinine, Ser: 6.43 mg/dL — ABNORMAL HIGH (ref 0.44–1.00)
GFR, Estimated: 9 mL/min — ABNORMAL LOW (ref >60)
GFR, Estimated: 8 mL/min — ABNORMAL LOW (ref >60)
GFR, Estimated: 8 mL/min — ABNORMAL LOW (ref >60)
GFR, Estimated: 8 mL/min — ABNORMAL LOW (ref >60)
Glucose, Bld: >1200 mg/dL (ref 70–99)
Glucose, Bld: 1114 mg/dL (ref 70–99)
Glucose, Bld: 603 mg/dL (ref 70–99)
Glucose, Bld: 454 mg/dL — ABNORMAL HIGH (ref 70–99)
Potassium: 4.3 mmol/L (ref 3.5–5.1)
Potassium: 3.6 mmol/L (ref 3.5–5.1)
Potassium: 3.5 mmol/L (ref 3.5–5.1)
Potassium: 3.5 mmol/L (ref 3.5–5.1)
Sodium: 118 mmol/L — CL (ref 135–145)
Sodium: 120 mmol/L — ABNORMAL LOW (ref 135–145)
Sodium: 124 mmol/L — ABNORMAL LOW (ref 135–145)
Sodium: 126 mmol/L — ABNORMAL LOW (ref 135–145)

## 2023-12-29 LAB — I-STAT CHEM 8, ED
BUN: 36 mg/dL — ABNORMAL HIGH (ref 6–20)
Calcium, Ion: 1 mmol/L — ABNORMAL LOW (ref 1.15–1.40)
Chloride: 87 mmol/L — ABNORMAL LOW (ref 98–111)
Creatinine, Ser: 6.2 mg/dL — ABNORMAL HIGH (ref 0.44–1.00)
Glucose, Bld: >700 mg/dL (ref 70–99)
HCT: 32 % — ABNORMAL LOW (ref 36.0–46.0)
Hemoglobin: 10.9 g/dL — ABNORMAL LOW (ref 12.0–15.0)
Potassium: 4.5 mmol/L (ref 3.5–5.1)
Sodium: 119 mmol/L — CL (ref 135–145)
TCO2: 18 mmol/L — ABNORMAL LOW (ref 22–32)

## 2023-12-29 LAB — CBC
HCT: 30.7 % — ABNORMAL LOW (ref 36.0–46.0)
Hemoglobin: 9 g/dL — ABNORMAL LOW (ref 12.0–15.0)
MCH: 27.6 pg (ref 26.0–34.0)
MCHC: 29.3 g/dL — ABNORMAL LOW (ref 30.0–36.0)
MCV: 94.2 fL (ref 80.0–100.0)
Platelets: 282 K/uL (ref 150–400)
RBC: 3.26 MIL/uL — ABNORMAL LOW (ref 3.87–5.11)
RDW: 16.8 % — ABNORMAL HIGH (ref 11.5–15.5)
WBC: 10 K/uL (ref 4.0–10.5)
nRBC: 0 % (ref 0.0–0.2)

## 2023-12-29 LAB — BETA-HYDROXYBUTYRIC ACID
Beta-Hydroxybutyric Acid: 3.96 mmol/L — ABNORMAL HIGH (ref 0.05–0.27)
Beta-Hydroxybutyric Acid: 1.46 mmol/L — ABNORMAL HIGH (ref 0.05–0.27)
Beta-Hydroxybutyric Acid: 0.12 mmol/L (ref 0.05–0.27)
Beta-Hydroxybutyric Acid: 0.08 mmol/L (ref 0.05–0.27)

## 2023-12-29 LAB — PROTIME-INR
INR: 1.3 — ABNORMAL HIGH (ref 0.8–1.2)
Prothrombin Time: 16.4 s — ABNORMAL HIGH (ref 11.4–15.2)

## 2023-12-29 LAB — BRAIN NATRIURETIC PEPTIDE: B Natriuretic Peptide: 4229.7 pg/mL — ABNORMAL HIGH (ref 0.0–100.0)

## 2023-12-29 LAB — HCG, SERUM, QUALITATIVE: Preg, Serum: NEGATIVE

## 2023-12-29 LAB — MRSA NEXT GEN BY PCR, NASAL: MRSA by PCR Next Gen: NOT DETECTED

## 2023-12-29 MED ORDER — HYDROXYZINE HCL 25 MG PO TABS
25.0000 mg | ORAL_TABLET | Freq: Three times a day (TID) | ORAL | Status: DC | PRN
  Administered 2023-12-29 – 2023-12-30 (×2): 25 mg via ORAL
  Filled 2023-12-29 (×2): qty 1

## 2023-12-29 MED ORDER — PNEUMOCOCCAL 20-VAL CONJ VACC 0.5 ML IM SUSY
0.5000 mL | PREFILLED_SYRINGE | INTRAMUSCULAR | Status: AC
  Administered 2023-12-30: 0.5 mL via INTRAMUSCULAR
  Filled 2023-12-29 (×2): qty 0.5

## 2023-12-29 MED ORDER — KETOROLAC TROMETHAMINE 30 MG/ML IJ SOLN
30.0000 mg | Freq: Once | INTRAMUSCULAR | Status: AC
  Administered 2023-12-29: 30 mg via INTRAVENOUS
  Filled 2023-12-29: qty 1

## 2023-12-29 MED ORDER — ALBUTEROL SULFATE (2.5 MG/3ML) 0.083% IN NEBU: 2.5000 mg | INHALATION_SOLUTION | RESPIRATORY_TRACT | Status: DC | PRN

## 2023-12-29 MED ORDER — PROCHLORPERAZINE EDISYLATE 10 MG/2ML IJ SOLN
10.0000 mg | INTRAMUSCULAR | Status: AC | PRN
  Administered 2023-12-29 – 2023-12-30 (×2): 10 mg via INTRAVENOUS
  Filled 2023-12-29 (×3): qty 2

## 2023-12-29 MED ORDER — LACTATED RINGERS IV BOLUS
20.0000 mL/kg | Freq: Once | INTRAVENOUS | Status: AC
  Administered 2023-12-29: 1258 mL via INTRAVENOUS

## 2023-12-29 MED ORDER — FENTANYL CITRATE PF 50 MCG/ML IJ SOSY
25.0000 ug | PREFILLED_SYRINGE | INTRAMUSCULAR | Status: DC | PRN
  Administered 2023-12-29: 25 ug via INTRAVENOUS
  Filled 2023-12-29: qty 1

## 2023-12-29 MED ORDER — POTASSIUM CHLORIDE CRYS ER 20 MEQ PO TBCR
40.0000 meq | EXTENDED_RELEASE_TABLET | Freq: Once | ORAL | Status: AC
  Administered 2023-12-29: 40 meq via ORAL
  Filled 2023-12-29: qty 2

## 2023-12-29 MED ORDER — ACETAMINOPHEN 650 MG RE SUPP: 650.0000 mg | Freq: Four times a day (QID) | RECTAL | Status: DC | PRN

## 2023-12-29 MED ORDER — INSULIN REGULAR(HUMAN) IN NACL 100-0.9 UT/100ML-% IV SOLN
INTRAVENOUS | Status: DC
  Administered 2023-12-29: 5.5 [IU]/h via INTRAVENOUS
  Filled 2023-12-29: qty 100

## 2023-12-29 MED ORDER — ACETAMINOPHEN 325 MG PO TABS: 650.0000 mg | ORAL_TABLET | Freq: Four times a day (QID) | ORAL | Status: DC | PRN

## 2023-12-29 MED ORDER — LACTATED RINGERS IV SOLN
INTRAVENOUS | Status: DC
  Administered 2023-12-29: 75 mL/h via INTRAVENOUS

## 2023-12-29 MED ORDER — DEXTROSE IN LACTATED RINGERS 5 % IV SOLN: INTRAVENOUS | Status: DC

## 2023-12-29 MED ORDER — HYDROMORPHONE HCL 1 MG/ML IJ SOLN
1.0000 mg | INTRAMUSCULAR | Status: DC | PRN
  Administered 2023-12-29 – 2023-12-30 (×5): 1 mg via INTRAVENOUS
  Filled 2023-12-29 (×5): qty 1

## 2023-12-29 MED ORDER — SODIUM BICARBONATE 650 MG PO TABS
650.0000 mg | ORAL_TABLET | Freq: Two times a day (BID) | ORAL | Status: DC
  Administered 2023-12-29 – 2023-12-31 (×4): 650 mg via ORAL
  Filled 2023-12-29 (×4): qty 1

## 2023-12-29 MED ORDER — ONDANSETRON HCL 4 MG/2ML IJ SOLN
4.0000 mg | Freq: Once | INTRAMUSCULAR | Status: AC
  Administered 2023-12-29: 4 mg via INTRAVENOUS
  Filled 2023-12-29: qty 2

## 2023-12-29 MED ORDER — LACTATED RINGERS IV SOLN: INTRAVENOUS | Status: DC

## 2023-12-29 MED ORDER — SODIUM CHLORIDE 0.9% FLUSH
3.0000 mL | Freq: Two times a day (BID) | INTRAVENOUS | Status: DC
  Administered 2023-12-29 – 2023-12-31 (×4): 3 mL via INTRAVENOUS

## 2023-12-29 MED ORDER — CHLORHEXIDINE GLUCONATE CLOTH 2 % EX PADS
6.0000 | MEDICATED_PAD | Freq: Every day | CUTANEOUS | Status: DC
  Administered 2023-12-29 – 2023-12-30 (×2): 6 via TOPICAL

## 2023-12-29 MED ORDER — INSULIN REGULAR(HUMAN) IN NACL 100-0.9 UT/100ML-% IV SOLN
INTRAVENOUS | Status: DC
  Filled 2023-12-29: qty 100

## 2023-12-29 MED ORDER — DEXTROSE 50 % IV SOLN
0.0000 mL | INTRAVENOUS | Status: DC | PRN
  Administered 2023-12-29: 50 mL via INTRAVENOUS
  Filled 2023-12-29 (×2): qty 50

## 2023-12-29 MED ORDER — TRIMETHOBENZAMIDE HCL 100 MG/ML IM SOLN
200.0000 mg | INTRAMUSCULAR | Status: AC
  Administered 2023-12-29: 200 mg via INTRAMUSCULAR
  Filled 2023-12-29: qty 2

## 2023-12-29 MED ORDER — HEPARIN SODIUM (PORCINE) 5000 UNIT/ML IJ SOLN
5000.0000 [IU] | Freq: Three times a day (TID) | INTRAMUSCULAR | Status: DC
  Administered 2023-12-29 – 2023-12-30 (×5): 5000 [IU] via SUBCUTANEOUS
  Filled 2023-12-29 (×7): qty 1

## 2023-12-29 MED ORDER — CARVEDILOL 25 MG PO TABS
25.0000 mg | ORAL_TABLET | Freq: Two times a day (BID) | ORAL | Status: DC
  Administered 2023-12-29 – 2023-12-31 (×5): 25 mg via ORAL
  Filled 2023-12-29 (×3): qty 1
  Filled 2023-12-29: qty 2
  Filled 2023-12-29: qty 1

## 2023-12-29 MED ORDER — HYDRALAZINE HCL 20 MG/ML IJ SOLN: 10.0000 mg | INTRAMUSCULAR | Status: DC | PRN

## 2023-12-29 MED ORDER — TRIMETHOBENZAMIDE HCL 100 MG/ML IM SOLN
200.0000 mg | Freq: Four times a day (QID) | INTRAMUSCULAR | Status: DC | PRN
  Administered 2023-12-29 – 2023-12-30 (×3): 200 mg via INTRAMUSCULAR
  Filled 2023-12-29 (×4): qty 2

## 2023-12-29 NOTE — ED Provider Notes (Signed)
 [facility] AT [facility] Provider Note   CSN: [id number] Arrival date & time: 12/29/23  9475     Patient presents with: Hyperglycemia   Ashley Freeman is a 32 y.o. female.   The history is provided by the patient and the EMS personnel.  Hyperglycemia Blood sugar level PTA:  High Severity:  Severe Onset quality:  Gradual Duration:  1 day Timing:  Constant Progression:  Worsening Chronicity:  Recurrent Diabetes status:  Controlled with insulin  Time since last antidiabetic medication: hours. Context: not recent illness   Context comment:  Drank tahitian treat soda Relieved by:  Nothing Ineffective treatments:  None tried Associated symptoms: abdominal pain and nausea   Associated symptoms: no chest pain and no fever   Risk factors: hx of DKA   Patient with DKA who missed dialysis on Friday but wnet on Saturday presents with DKA    Past Medical History:  Diagnosis Date   Abscess, gluteal, right 08/24/2013   Anemia 02/19/2012   Bartholin's gland abscess 09/19/2013   Bipolar disorder (HCC)    BV (bacterial vaginosis) 11/24/2015   Depression    Diabetes mellitus type I (HCC) 2001   Diagnosed at age 44 ; Type I   Diarrhea 05/30/2016   DKA (diabetic ketoacidoses) 08/19/2013   Also in 2018   ESRD (end stage renal disease) (HCC)    Gonorrhea 08/2011   Treated in 09/2011   HFrEF (heart failure with reduced ejection fraction) (HCC)    a. 2022 Echo: EF 40%; b. 10/2021 Echo: EF 55%; b. 07/2022 MV: No ischemia. EF 31%; c. 08/2022 Echo: EF 35%, mildly dil RV, sev TR.   History of trichomoniasis 05/31/2016   Hyperlipidemia 03/28/2016   Hypertension    NICM (nonischemic cardiomyopathy) (HCC)    Sepsis (HCC) 09/19/2013     Prior to Admission medications   Medication Sig Start Date End Date Taking? Authorizing Provider  albuterol  (VENTOLIN  HFA) 108 (90 Base) MCG/ACT inhaler Inhale 2 puffs into the lungs every 4 (four) hours as needed  for wheezing or shortness of breath. 11/24/22   [provider]  atorvastatin  (LIPITOR ) 80 MG tablet Take 1 tablet (80 mg total) by mouth daily. Patient not taking: Reported on 12/23/2023 10/15/23   Jonel Lonni SQUIBB, MD  bumetanide  (BUMEX ) 2 MG tablet Take 10 mg by mouth daily.    [provider]  calcitRIOL  (ROCALTROL ) 0.25 MCG capsule Take 5 capsules (1.25 mcg total) by mouth every Tuesday, Thursday, and Saturday at 6 PM. Patient taking differently: Take 1.25 mcg by mouth every Monday, Wednesday, and Friday. 07/09/23   Cindy Garnette POUR, MD  carvedilol  (COREG ) 25 MG tablet Take 25 mg by mouth 2 (two) times daily with a meal.    [provider]  DULoxetine  (CYMBALTA ) 20 MG capsule Take 40 mg by mouth daily. 07/29/23   [provider]  famotidine  (PEPCID ) 20 MG tablet Take 20 mg by mouth daily. 07/29/23   [provider]  fluticasone  (FLONASE ) 50 MCG/ACT nasal spray Place 2 sprays into both nostrils daily. 12/11/23 12/10/24  [provider]  hydrOXYzine  (ATARAX ) 25 MG tablet Take 25 mg by mouth 3 (three) times daily as needed for anxiety, itching, nausea or vomiting.    [provider]  insulin  aspart (NOVOLOG ) 100 UNIT/ML injection Inject 5 Units into the skin 3 (three) times daily with meals.    [provider]  lamoTRIgine  (LAMICTAL ) 200 MG tablet Take 1 tablet (200 mg total) by mouth  daily. 10/29/23   Regalado, Belkys A, MD  LANTUS  SOLOSTAR 100 UNIT/ML Solostar Pen Inject 10 Units into the skin daily.    [provider]  losartan  (COZAAR ) 100 MG tablet Take 200 mg by mouth daily. 03/25/23   [provider]  OLANZapine  zydis (ZYPREXA ) 5 MG disintegrating tablet Take 5 mg by mouth at bedtime. 09/23/23 02/08/24  [provider]  omeprazole  (PRILOSEC) 40 MG capsule Take 40 mg by mouth daily. 12/11/23 02/09/24  [provider]  ondansetron  (ZOFRAN -ODT) 4 MG disintegrating tablet Take 1 tablet (4 mg  total) by mouth every 8 (eight) hours as needed for nausea or vomiting. 06/23/23   Davis, Jonathon H, MD  Pancrelipase , Lip-Prot-Amyl, 3000-9500 units CPEP Take 3,000 units of lipase by mouth 3 (three) times daily before meals. 09/23/23 02/08/24  [provider]  prochlorperazine  (COMPAZINE ) 5 MG tablet Take 5 mg by mouth every 6 (six) hours as needed for nausea or vomiting.    [provider]  sacubitril-valsartan (ENTRESTO) 24-26 MG Take 1 tablet by mouth 2 (two) times daily.    [provider]  sodium bicarbonate  650 MG tablet Take 650 mg by mouth 2 (two) times daily. 07/29/23   [provider]  SUMAtriptan  (IMITREX ) 50 MG tablet Take 50 mg by mouth every 2 (two) hours as needed for migraine or headache. 06/20/22   [provider]    Allergies: Keflex  [cephalexin ], Penicillins, Vibramycin  [doxycycline ], Benadryl  [diphenhydramine ], Dilaudid  [hydromorphone ], Methotrexate derivatives, and Roxicodone  [oxycodone ]    Review of Systems  Constitutional:  Negative for fever.  HENT:  Negative for facial swelling.   Respiratory:  Negative for wheezing and stridor.   Cardiovascular:  Negative for chest pain.  Gastrointestinal:  Positive for abdominal pain and nausea.  All other systems reviewed and are negative.   Updated Vital Signs BP (!) 158/92 (BP Location: Right Arm)   Pulse 96   Temp 98.4 F (36.9 C)   Ht 5' 3 (1.6 m)   Wt 62.9 kg   LMP  (LMP Unknown) Comment: patient states she doesnt get her period  SpO2 98%   BMI 24.56 kg/m   Physical Exam Vitals and nursing note reviewed.  Constitutional:      General: She is not in acute distress.    Appearance: Normal appearance. She is well-developed.  HENT:     Head: Normocephalic and atraumatic.     Nose: Nose normal.  Eyes:     Pupils: Pupils are equal, round, and reactive to light.  Cardiovascular:     Rate and Rhythm: Normal rate and regular rhythm.     Pulses: Normal pulses.     Heart  sounds: Normal heart sounds.  Pulmonary:     Effort: Pulmonary effort is normal. No respiratory distress.     Breath sounds: Normal breath sounds.  Abdominal:     General: Bowel sounds are normal. There is no distension.     Palpations: Abdomen is soft.     Tenderness: There is no abdominal tenderness. There is no guarding or rebound.  Musculoskeletal:        General: Normal range of motion.     Cervical back: Neck supple.  Skin:    General: Skin is dry.     Capillary Refill: Capillary refill takes less than 2 seconds.     Findings: No erythema or rash.  Neurological:     General: No focal deficit present.     Mental Status: She is alert.  Deep Tendon Reflexes: Reflexes normal.  Psychiatric:        Mood and Affect: Mood normal.     (all labs ordered are listed, but only abnormal results are displayed) Results for orders placed or performed during the hospital encounter of 12/29/23  CBG monitoring, ED   Collection Time: 12/29/23  5:28 AM  Result Value Ref Range   Glucose-Capillary >600 (HH) 70 - 99 mg/dL  CBC   Collection Time: 12/29/23  5:38 AM  Result Value Ref Range   WBC 10.0 4.0 - 10.5 K/uL   RBC 3.26 (L) 3.87 - 5.11 MIL/uL   Hemoglobin 9.0 (L) 12.0 - 15.0 g/dL   HCT 69.2 (L) 63.9 - 53.9 %   MCV 94.2 80.0 - 100.0 fL   MCH 27.6 26.0 - 34.0 pg   MCHC 29.3 (L) 30.0 - 36.0 g/dL   RDW 83.1 (H) 88.4 - 84.4 %   Platelets 282 150 - 400 K/uL   nRBC 0.0 0.0 - 0.2 %  Basic metabolic panel   Collection Time: 12/29/23  5:38 AM  Result Value Ref Range   Sodium 118 (LL) 135 - 145 mmol/L   Potassium 4.3 3.5 - 5.1 mmol/L   Chloride 84 (L) 98 - 111 mmol/L   CO2 15 (L) 22 - 32 mmol/L   Glucose, Bld >1,200 (HH) 70 - 99 mg/dL   BUN 33 (H) 6 - 20 mg/dL   Creatinine, Ser 3.71 (H) 0.44 - 1.00 mg/dL   Calcium  7.4 (L) 8.9 - 10.3 mg/dL   GFR, Estimated 9 (L) >60 mL/min   Anion gap 19 (H) 5 - 15  BNP (Order if Patient has history of Heart Failure)   Collection Time: 12/29/23  5:38  AM  Result Value Ref Range   B Natriuretic Peptide 4,229.7 (H) 0.0 - 100.0 pg/mL  I-Stat Venous Blood Gas, ED   Collection Time: 12/29/23  6:20 AM  Result Value Ref Range   pH, Ven 7.289 7.25 - 7.43   pCO2, Ven 36.4 (L) 44 - 60 mmHg   pO2, Ven 164 (H) 32 - 45 mmHg   Bicarbonate 17.5 (L) 20.0 - 28.0 mmol/L   TCO2 19 (L) 22 - 32 mmol/L   O2 Saturation 99 %   Acid-base deficit 8.0 (H) 0.0 - 2.0 mmol/L   Sodium 117 (LL) 135 - 145 mmol/L   Potassium 4.5 3.5 - 5.1 mmol/L   Calcium , Ion 0.98 (L) 1.15 - 1.40 mmol/L   HCT 30.0 (L) 36.0 - 46.0 %   Hemoglobin 10.2 (L) 12.0 - 15.0 g/dL   Sample type VENOUS    Comment NOTIFIED PHYSICIAN   I-stat chem 8, ED (not at [facility], [facility] or [facility])   Collection Time: 12/29/23  6:20 AM  Result Value Ref Range   Sodium 119 (LL) 135 - 145 mmol/L   Potassium 4.5 3.5 - 5.1 mmol/L   Chloride 87 (L) 98 - 111 mmol/L   BUN 36 (H) 6 - 20 mg/dL   Creatinine, Ser 3.79 (H) 0.44 - 1.00 mg/dL   Glucose, Bld >299 (HH) 70 - 99 mg/dL   Calcium , Ion 1.00 (L) 1.15 - 1.40 mmol/L   TCO2 18 (L) 22 - 32 mmol/L   Hemoglobin 10.9 (L) 12.0 - 15.0 g/dL   HCT 67.9 (L) 63.9 - 53.9 %   Comment NOTIFIED PHYSICIAN    DG Chest 2 View Result Date: 12/29/2023 EXAM: 2 VIEW(S) XRAY OF THE CHEST 12/29/2023 05:49:00 AM COMPARISON: 12/22/2023 CLINICAL HISTORY: SOB FINDINGS: LUNGS  AND PLEURA: No focal pulmonary opacity. No pulmonary edema. No pleural effusion. No pneumothorax. HEART AND MEDIASTINUM: Cardiomegaly, stable. BONES AND SOFT TISSUES: No acute osseous abnormality. IMPRESSION: 1. No acute findings. 2. Cardiomegaly, stable. Electronically signed by: Waddell Calk MD 12/29/2023 06:01 AM EDT RP Workstation: [id number]   IR Paracentesis Result Date: 12/24/2023 INDICATION: 33 year old female with end-stage renal disease and recurrent ascites for diagnostic and therapeutic paracentesis. EXAM: ULTRASOUND GUIDED RIGHT PARACENTESIS MEDICATIONS: 1% lidocaine  10 mL COMPLICATIONS: None immediate.  PROCEDURE: Informed written consent was obtained from the patient after a discussion of the risks, benefits and alternatives to treatment. A timeout was performed prior to the initiation of the procedure. Initial ultrasound scanning demonstrates a large amount of ascites within the right lower abdominal quadrant. The right lower abdomen was prepped and draped in the usual sterile fashion. 1% lidocaine  was used for local anesthesia. Following this, a 19 gauge, 7-cm, Yueh catheter was introduced. An ultrasound image was saved for documentation purposes. The paracentesis was performed. The catheter was removed and a dressing was applied. The patient tolerated the procedure well without immediate post procedural complication. FINDINGS: A total of approximately 5 L of clear, yellow fluid was removed. Samples were sent to the laboratory as requested by the clinical team. IMPRESSION: Successful ultrasound-guided paracentesis yielding 5 liters of peritoneal fluid. PLAN: Performed By Lavanda Jurist, PA-C Electronically Signed   By: CHRISTELLA.  Shick M.D.   On: 12/24/2023 10:04   CT ABDOMEN PELVIS WO CONTRAST Result Date: 12/22/2023 CLINICAL DATA:  Abdominal pain EXAM: CT ABDOMEN AND PELVIS WITHOUT CONTRAST TECHNIQUE: Multidetector CT imaging of the abdomen and pelvis was performed following the standard protocol without IV contrast. RADIATION DOSE REDUCTION: This exam was performed according to the departmental dose-optimization program which includes automated exposure control, adjustment of the mA and/or kV according to patient size and/or use of iterative reconstruction technique. COMPARISON:  CT abdomen pelvis dated 11/06/2023. FINDINGS: Evaluation of this exam is limited in the absence of intravenous contrast. Lower chest: Partially visualized small right pleural effusion with partial compressive atelectasis of the right lung base. There is mild cardiomegaly. There is hypoattenuation of the cardiac blood pool suggestive of  anemia. Clinical correlation is recommended. No intra-abdominal free air.  Moderate ascites. Hepatobiliary: Mildly enlarged liver measuring 17 cm in midclavicular length. No biliary dilatation. Cholecystectomy. Pancreas: The pancreas is mildly atrophic for age. No dilatation of the main pancreatic duct Spleen: Normal in size without focal abnormality. Adrenals/Urinary Tract: The adrenal glands are unremarkable. Mild bilateral renal parenchyma atrophy. There is no hydronephrosis or nephrolithiasis on either side. The visualized ureters and urinary bladder appear unremarkable. Stomach/Bowel: There is no bowel obstruction. The appendix is normal. Vascular/Lymphatic: The abdominal aorta and IVC are unremarkable. Atherosclerotic calcification of the mesenteric arteries. No portal venous gas. No obvious adenopathy. Reproductive: The uterus is grossly unremarkable. No suspicious adnexal masses. Other: Diffuse subcutaneous edema and anasarca. Musculoskeletal: No acute or significant osseous findings. IMPRESSION: 1. Moderate ascites and anasarca, progressed since the prior CT. 2. No bowel obstruction. Normal appendix. 3. Partially visualized small right pleural effusion with partial compressive atelectasis of the right lung base. 4. Mild cardiomegaly. Electronically Signed   By: Vanetta Chou M.D.   On: 12/22/2023 12:42   DG Chest Portable 1 View Result Date: 12/22/2023 CLINICAL DATA:  Shortness of breath.  Hyperglycemia. EXAM: PORTABLE CHEST 1 VIEW COMPARISON:  12/19/2023 FINDINGS: Heart size is mildly enlarged, likely exaggerated by low lung volumes. Mild linear opacity in the right lung base  is new and consistent with mild subsegmental atelectasis. No No evidence of focal consolidation or pleural effusion. IMPRESSION: Low lung volumes with mild right basilar subsegmental atelectasis. Electronically Signed   By: Norleen DELENA Kil M.D.   On: 12/22/2023 11:12   DG Chest Portable 1 View Result Date: 12/19/2023 CLINICAL  DATA:  Shortness of breath. EXAM: PORTABLE CHEST 1 VIEW COMPARISON:  Multiple priors, most recently 12/13/2023 FINDINGS: Chronic cardiomegaly. Mild pulmonary edema. Suspect trace pleural effusions. No confluent airspace disease. No pneumothorax. No acute osseous findings. IMPRESSION: Chronic cardiomegaly. Mild pulmonary edema and trace pleural effusions. Electronically Signed   By: Andrea Gasman M.D.   On: 12/19/2023 21:27   DG Chest 2 View Result Date: 12/13/2023 CLINICAL DATA:  Shortness of breath, missed dialysis. EXAM: CHEST - 2 VIEW COMPARISON:  None Available. FINDINGS: The cardiac silhouette is mildly enlarged and unchanged in size. Low lung volumes are noted with subsequent crowding of the bronchovascular lung markings. No acute infiltrate, pleural effusion or pneumothorax is identified. A radiopaque vascular stent is seen within the medial aspect of the proximal right upper extremity. The visualized skeletal structures are unremarkable. IMPRESSION: Low lung volumes without acute or active cardiopulmonary disease. Electronically Signed   By: Suzen Dials M.D.   On: 12/13/2023 21:26    EKG: EKG Interpretation Date/Time:  Sunday December 29 2023 05:29:13 EDT Ventricular Rate:  95 PR Interval:  144 QRS Duration:  95 QT Interval:  428 QTC Calculation: 539 R Axis:   37  Text Interpretation: Sinus rhythm Borderline repolarization abnormality Prolonged QT interval Confirmed by Dorri Ozturk ([id number]) on 12/29/2023 5:43:50 AM  Radiology: [facility] Chest 2 View Result Date: 12/29/2023 EXAM: 2 VIEW(S) XRAY OF THE CHEST 12/29/2023 05:49:00 AM COMPARISON: 12/22/2023 CLINICAL HISTORY: SOB FINDINGS: LUNGS AND PLEURA: No focal pulmonary opacity. No pulmonary edema. No pleural effusion. No pneumothorax. HEART AND MEDIASTINUM: Cardiomegaly, stable. BONES AND SOFT TISSUES: No acute osseous abnormality. IMPRESSION: 1. No acute findings. 2. Cardiomegaly, stable. Electronically signed by: Waddell Calk MD  12/29/2023 06:01 AM EDT RP Workstation: [id number]     .Critical Care  Performed by: Nettie Earing, MD Authorized by: Nettie Earing, MD   Critical care provider statement:    Critical care time (minutes):  60   Critical care end time:  12/29/2023 7:07 AM   Critical care was necessary to treat or prevent imminent or life-threatening deterioration of the following conditions:  Endocrine crisis   Critical care was time spent personally by me on the following activities:  Development of treatment plan with patient or surrogate, discussions with consultants, evaluation of patient's response to treatment, examination of patient, ordering and review of laboratory studies, ordering and review of radiographic studies, ordering and performing treatments and interventions, pulse oximetry, re-evaluation of patient's condition and review of old charts   Care discussed with: admitting provider      Medications Ordered in the ED  lactated ringers  bolus 1,258 mL (has no administration in time range)  insulin  regular, human (MYXREDLIN ) 100 units/ 100 mL infusion (has no administration in time range)  lactated ringers  infusion (has no administration in time range)  dextrose  5 % in lactated ringers  infusion (has no administration in time range)  dextrose  50 % solution 0-50 mL (has no administration in time range)  ketorolac  (TORADOL ) 30 MG/ML injection 30 mg (has no administration in time range)  trimethobenzamide  (TIGAN ) injection 200 mg (has no administration in time range)  Medical Decision Making Missed dialysis and then sugar was high   Amount and/or Complexity of Data Reviewed Independent Historian: EMS    Details: See above  External Data Reviewed: notes.    Details: Previous notes reviewed  Labs: ordered.    Details: Sodium 118 normal with glucose > 1200, normal potassium 4.5, elevated creatinine. Normal white count 10, low hemoglobin 9, normal  platelets  Radiology: ordered.  Risk Prescription drug management. Decision regarding hospitalization.     Final diagnoses:  None   The patient appears reasonably stabilized for admission considering the current resources, flow, and capabilities available in the ED at this time, and I doubt any other [facility] requiring further screening and/or treatment in the ED prior to admission.  ED Discharge Orders     None          Billijo Dilling, MD 12/29/23 [phone]

## 2023-12-29 NOTE — ED Notes (Signed)
 Bed ready preparing pt for transport

## 2023-12-29 NOTE — ED Triage Notes (Signed)
 Patient BIB [facility] from home with complaint of missing dialysis on Friday, (Dialysis M-W-F) reports not feeling well a few hours ago.   Patient denies N/V/D.  EMS reports CBG read HIGH

## 2023-12-29 NOTE — Consult Note (Signed)
 NAME:  Ashley Freeman, MRN:  [id number], DOB:  03-03-1991, LOS: 0 ADMISSION DATE:  12/29/2023, CONSULTATION DATE:  12/29/2023 REFERRING MD:  Dr. Claudene - [facility], CHIEF COMPLAINT:  Hyperglycemia    History of Present Illness:  Ashley Freeman is a 33 year old female with a past medical history significant for ESRD, type 1 diabetes, HFrEF, bipolar disease, hyperlipidemia, hypertension, medical noncompliance, multiple admissions for DKA, anxiety presented to the ED 8/31 with complaints of multiple missed dialysis treatments with worsening pain.  On admission patient was seen with glucose greater than 1200 on chemistry this coupled with concern for volume overload patient was admitted per hospitalist.   Patient has been receiving IV insulin  for management of hyperglycemia however given inability to aggressively hydrate patient continues to remain significantly hyperglycemic prompting every 30 minute blood sugar checks resulting in need for higher level of care and consult to PCCM  Pertinent  Medical History  ESRD, type 1 diabetes, HFrEF, bipolar disease, hyperlipidemia, hypertension, medical noncompliance, multiple admissions for DKA, anxiety   Significant Hospital Events: Including procedures, antibiotic start and stop dates in addition to other pertinent events   8/31 presented with hyperglycemia and volume overload after missing several hemodialysis sessions  Interim History / Subjective:  Slightly lethargic after receiving Dilaudid  but mentating appropriately  Objective    Blood pressure (!) 173/92, pulse 95, temperature 98.7 F (37.1 C), resp. rate 16, height 5' 3 (1.6 m), weight 62.9 kg, SpO2 100%.       No intake or output data in the 24 hours ending 12/29/23 1504 Filed Weights   12/29/23 0531  Weight: 62.9 kg    Examination: General: Acute on chronic ill-appearing adult female lying in bed in no acute distress HEENT: [facility]/AT, MM pink/moist, PERRL,  Neuro:  Alert and oriented x 3, nonfocal CV: s1s2 regular rate and rhythm, no murmur, rubs, or gallops,  PULM: Clear to auscultation bilaterally, no increased work of breathing, no added breath sounds GI: soft, bowel sounds active in all 4 quadrants, non-tender, non-distended Extremities: warm/dry, no edema  Skin: no rashes or lesions  Resolved problem list   Assessment and Plan  Hyperglycemia vs DKA with history of type 1 diabetes -Patient presented with venous pH 7.3, CO2 on chemistry 15, anion gap 19, and appropriate mentation meeting criteria for P: Given history of ESRD several missed HD sessions need to use judicious IV hydration as patient is already overloaded Insulin  drip  Closely monitor patient's electrolytes with frequent Bmets especially potassium Maintain potassium greater than 4 Accu-Cheks every 1 hour Once blood glucose falls below 250 start patient on D5 IV fluids When I anion gap closes and patient is able to tolerate oral diet transition to subcu long-acting insulin  in slowly turned drip off over the next 1-2 hours Monitor renal function Monitor lactate NPO/noncaloric clears until gap closed Resume home insulin  regiment once appropriate  Volume overload  - Missed multiple HD sessions prior to admission P: Close monitoring of volume status Continue IV hydration given DKA Plan for HD nephrology, see below  End-stage renal disease on HD Monday Wednesday Friday P: Nephrology following, appreciate assistance  Essential hypertension Hyperlipidemia P:  Continue Coreg  Continuous telemetry   Prolonged QTc P; Avoid QTc prolonging medications   Labs   CBC: Recent Labs  Lab 12/22/23 1942 12/24/23 0459 12/29/23 0538 12/29/23 0620  WBC 11.7* 9.1 10.0  --   NEUTROABS 7.4 4.4  --   --   HGB 8.5* 8.1* 9.0* 10.9*  10.2*  HCT 27.4* 25.8* 30.7* 32.0*  30.0*  MCV 88.1 87.8 94.2  --   PLT 317 299 282  --     Basic Metabolic Panel: Recent Labs  Lab 12/23/23 0136  12/23/23 1706 12/24/23 0459 12/29/23 0538 12/29/23 0620 12/29/23 0947  NA 133* 133* 133* 118* 119*  117* 120*  K 4.2 4.8 4.3 4.3 4.5  4.5 3.6  CL 93* 94* 98 84* 87* 86*  CO2 22 22 23  15*  --  14*  GLUCOSE 69* 274* 84 >1,200* >700* 1,114*  BUN 46* 27* 29* 33* 36* 33*  CREATININE 7.37* 5.11* 5.78* 6.28* 6.20* 6.40*  CALCIUM  8.8* 8.8* 8.2* 7.4*  --  7.5*  MG  --   --  2.0  --   --   --   PHOS  --   --  5.8*  --   --   --    GFR: Estimated Creatinine Clearance: 11.4 mL/min (A) (by C-G formula based on SCr of 6.4 mg/dL (H)). Recent Labs  Lab 12/22/23 1942 12/24/23 0459 12/29/23 0538  WBC 11.7* 9.1 10.0    Liver Function Tests: Recent Labs  Lab 12/23/23 1706 12/24/23 0459  AST  --  18  ALT  --  20  ALKPHOS  --  486*  BILITOT  --  0.7  PROT  --  4.7*  ALBUMIN  2.6* 2.2*   No results for input(s): LIPASE, AMYLASE in the last 168 hours. No results for input(s): AMMONIA in the last 168 hours.  ABG    Component Value Date/Time   PHART 7.33 (L) 10/26/2023 1415   PCO2ART 38 10/26/2023 1415   PO2ART 67 (L) 10/26/2023 1415   HCO3 17.5 (L) 12/29/2023 0620   TCO2 19 (L) 12/29/2023 0620   TCO2 18 (L) 12/29/2023 0620   ACIDBASEDEF 8.0 (H) 12/29/2023 0620   O2SAT 99 12/29/2023 0620     Coagulation Profile: Recent Labs  Lab 12/29/23 0538  INR 1.3*    Cardiac Enzymes: No results for input(s): CKTOTAL, CKMB, CKMBINDEX, TROPONINI in the last 168 hours.  HbA1C: Hemoglobin A1C  Date/Time Value Ref Range Status  11/11/2013 04:31 AM 14.6 (H) 4.2 - 6.3 % Final    Comment:    The American Diabetes Association recommends that a primary goal of therapy should be <7% and that physicians should reevaluate the treatment regimen in patients with HbA1c values consistently >8%.   09/12/2011 02:53 AM SEE COMMENT 4.2 - 6.3 % Final    Comment:    The American Diabetes Association recommends that a primary goal of therapy should be <7% and that physicians should  reevaluate the treatment regimen in patients with HbA1c values consistently >8%. HGB A1C - Unable to perform testing at [facility] due  - to interfering substance. HGB A1C - H -- 16.8 %  - Reference Range: 4.8-5.6  - ----------------------------------------  - Increased risk for diabetes: 5.7-6.4  - Diabetes: >6.4  - Glycemic control for adults with  - .SABRA... diabetes: <7.0  - ----------------------------------------  - PERFORMED BY [employer] [place] [employer] SPEC.NO.: [id number]  - [address] [id number]  - ELSIE JULIANNA DROSS, MD  - [phone]    HbA1c, POC (controlled diabetic range)  Date/Time Value Ref Range Status  06/04/2018 09:29 AM   Final    Comment:    >15   Hgb A1c MFr Bld  Date/Time Value Ref Range Status  12/20/2023 03:05 AM 10.8 (H) 4.8 - 5.6 % Final    Comment:    (  NOTE) Diagnosis of Diabetes The following HbA1c ranges recommended by the American Diabetes Association (ADA) may be used as an aid in the diagnosis of diabetes mellitus.  Hemoglobin             Suggested A1C NGSP%              Diagnosis  <5.7                   Non Diabetic  5.7-6.4                Pre-Diabetic  >6.4                   Diabetic  <7.0                   Glycemic control for                       adults with diabetes.    11/18/2023 06:30 AM 11.2 (H) 4.8 - 5.6 % Final    Comment:    (NOTE) Diagnosis of Diabetes The following HbA1c ranges recommended by the American Diabetes Association (ADA) may be used as an aid in the diagnosis of diabetes mellitus.  Hemoglobin             Suggested A1C NGSP%              Diagnosis  <5.7                   Non Diabetic  5.7-6.4                Pre-Diabetic  >6.4                   Diabetic  <7.0                   Glycemic control for                       adults with diabetes.      CBG: Recent Labs  Lab 12/29/23 1250 12/29/23 1323 12/29/23 1359 12/29/23 1437 12/29/23 1501  GLUCAP >600* >600* >600* 505* 501*     Review of Systems:   Please see the history of present illness. All other systems reviewed and are negative   Past Medical History:  She,  has a past medical history of Abscess, gluteal, right (08/24/2013), Anemia (02/19/2012), Bartholin's gland abscess (09/19/2013), Bipolar disorder (HCC), BV (bacterial vaginosis) (11/24/2015), Depression, Diabetes mellitus type I (HCC) (2001), Diarrhea (05/30/2016), DKA (diabetic ketoacidoses) (08/19/2013), ESRD (end stage renal disease) (HCC), Gonorrhea (08/2011), HFrEF (heart failure with reduced ejection fraction) (HCC), History of trichomoniasis (05/31/2016), Hyperlipidemia (03/28/2016), Hypertension, NICM (nonischemic cardiomyopathy) (HCC), and Sepsis (HCC) (09/19/2013).   Surgical History:   Past Surgical History:  Procedure Laterality Date   A/V FISTULAGRAM Right 06/17/2023   Procedure: A/V Fistulagram;  Surgeon: Marea Selinda RAMAN, MD;  Location: [facility] INVASIVE CV LAB;  Service: Cardiovascular;  Laterality: Right;   A/V SHUNT INTERVENTION N/A 09/25/2023   Procedure: A/V SHUNT INTERVENTION;  Surgeon: Pearline Norman RAMAN, MD;  Location: [facility] PV LAB;  Service: Cardiovascular;  Laterality: N/A;   AV FISTULA PLACEMENT Right 07/06/2022   Procedure: ARTERIOVENOUS GRAFT CREATION;  Surgeon: Gretta Lonni PARAS, MD;  Location: [facility] OR;  Service: Vascular;  Laterality: Right;   CESAREAN SECTION N/A 10/05/2019   Procedure: CESAREAN SECTION;  Surgeon: Izell Harari, MD;  Location: [facility] LD  ORS;  Service: Obstetrics;  Laterality: N/A;   CHOLECYSTECTOMY N/A 07/02/2023   Procedure: LAPAROSCOPIC CHOLECYSTECTOMY;  Surgeon: Ebbie Cough, MD;  Location: [facility] OR;  Service: General;  Laterality: N/A;   INCISION AND DRAINAGE ABSCESS Left 09/28/2019   Procedure: INCISION AND DRAINAGE VULVAR ABCESS;  Surgeon: Edsel Norleen GAILS, MD;  Location: [facility] OR;  Service: Gynecology;  Laterality: Left;   INCISION AND DRAINAGE PERIRECTAL ABSCESS Right 08/18/2013   Procedure: IRRIGATION AND  DEBRIDEMENT GLUTEAL ABSCESS;  Surgeon: Lynda Leos, MD;  Location: [facility] OR;  Service: General;  Laterality: Right;   INCISION AND DRAINAGE PERIRECTAL ABSCESS Right 09/19/2013   Procedure: IRRIGATION AND DEBRIDEMENT RIGHT GLUTEAL AND LABIAL ABSCESSES;  Surgeon: Lynda Leos, MD;  Location: [facility] OR;  Service: General;  Laterality: Right;   INCISION AND DRAINAGE PERIRECTAL ABSCESS Right 09/24/2013   Procedure: IRRIGATION AND DEBRIDEMENT PERIRECTAL ABSCESS;  Surgeon: Lynwood MALVA Pina, MD;  Location: [facility] OR;  Service: General;  Laterality: Right;   IR PARACENTESIS  08/28/2023   IR PARACENTESIS  11/04/2023   IR PARACENTESIS  12/23/2023     Social History:   reports that she has never smoked. She has never been exposed to tobacco smoke. She has never used smokeless tobacco. She reports that she does not currently use alcohol. She reports that she does not use drugs.   Family History:  Her family history includes Asthma in her mother; Carpal tunnel syndrome in her mother; Diabetes in her paternal grandmother; Gout in her father. There is no history of Anesthesia problems.   Allergies Allergies  Allergen Reactions   Keflex  [Cephalexin ] Anaphylaxis    Ceftriaxone  in the past with no reaction   Penicillins Anaphylaxis, Hives and Rash   Vibramycin  [Doxycycline ] Anaphylaxis   Benadryl  [Diphenhydramine ] Itching   Dilaudid  [Hydromorphone ] Itching   Methotrexate Derivatives Rash   Roxicodone  [Oxycodone ] Itching     Home Medications  Prior to Admission medications   Medication Sig Start Date End Date Taking? Authorizing Provider  albuterol  (VENTOLIN  HFA) 108 (90 Base) MCG/ACT inhaler Inhale 2 puffs into the lungs every 4 (four) hours as needed for wheezing or shortness of breath. 11/24/22   [provider]  atorvastatin  (LIPITOR ) 80 MG tablet Take 1 tablet (80 mg total) by mouth daily. Patient not taking: Reported on 12/23/2023 10/15/23   Jonel Lonni SQUIBB, MD  bumetanide  (BUMEX ) 2 MG tablet  Take 10 mg by mouth daily.    [provider]  calcitRIOL  (ROCALTROL ) 0.25 MCG capsule Take 5 capsules (1.25 mcg total) by mouth every Tuesday, Thursday, and Saturday at 6 PM. Patient taking differently: Take 1.25 mcg by mouth every Monday, Wednesday, and Friday. 07/09/23   Cindy Garnette POUR, MD  carvedilol  (COREG ) 25 MG tablet Take 25 mg by mouth 2 (two) times daily with a meal.    [provider]  DULoxetine  (CYMBALTA ) 20 MG capsule Take 40 mg by mouth daily. 07/29/23   [provider]  famotidine  (PEPCID ) 20 MG tablet Take 20 mg by mouth daily. 07/29/23   [provider]  fluticasone  (FLONASE ) 50 MCG/ACT nasal spray Place 2 sprays into both nostrils daily. 12/11/23 12/10/24  [provider]  hydrOXYzine  (ATARAX ) 25 MG tablet Take 25 mg by mouth 3 (three) times daily as needed for anxiety, itching, nausea or vomiting.    [provider]  insulin  aspart (NOVOLOG ) 100 UNIT/ML injection Inject 5 Units into the skin 3 (three) times daily with meals.    [provider]  lamoTRIgine  (LAMICTAL ) 200 MG  tablet Take 1 tablet (200 mg total) by mouth daily. 10/29/23   Regalado, Belkys A, MD  LANTUS  SOLOSTAR 100 UNIT/ML Solostar Pen Inject 10 Units into the skin daily.    [provider]  losartan  (COZAAR ) 100 MG tablet Take 200 mg by mouth daily. 03/25/23   [provider]  OLANZapine  zydis (ZYPREXA ) 5 MG disintegrating tablet Take 5 mg by mouth at bedtime. 09/23/23 02/08/24  [provider]  omeprazole  (PRILOSEC) 40 MG capsule Take 40 mg by mouth daily. 12/11/23 02/09/24  [provider]  ondansetron  (ZOFRAN -ODT) 4 MG disintegrating tablet Take 1 tablet (4 mg total) by mouth every 8 (eight) hours as needed for nausea or vomiting. 06/23/23   Naija Troost, Jonathon H, MD  Pancrelipase , Lip-Prot-Amyl, 3000-9500 units CPEP Take 3,000 units of lipase by mouth 3 (three) times daily before meals. 09/23/23 02/08/24  [provider]   prochlorperazine  (COMPAZINE ) 5 MG tablet Take 5 mg by mouth every 6 (six) hours as needed for nausea or vomiting.    [provider]  sacubitril-valsartan (ENTRESTO) 24-26 MG Take 1 tablet by mouth 2 (two) times daily.    [provider]  sodium bicarbonate  650 MG tablet Take 650 mg by mouth 2 (two) times daily. 07/29/23   [provider]  SUMAtriptan  (IMITREX ) 50 MG tablet Take 50 mg by mouth every 2 (two) hours as needed for migraine or headache. 06/20/22   [provider]     Critical care time: NA  Limmie Schoenberg D. Harris, NP-C [facility] Personal contact information can be found on Amion  If no contact or response made please call [phone] 12/29/2023, 4:21 PM

## 2023-12-29 NOTE — ED Notes (Signed)
 Critical results given to Dr. Palumbo.

## 2023-12-29 NOTE — ED Notes (Signed)
Pt and all belongings transported upstairs.  

## 2023-12-29 NOTE — Progress Notes (Addendum)
 eLink Physician-Brief Progress Note Patient Name: DEKAYLA PRESTRIDGE DOB: 1991/01/15 MRN: [id number]   Date of Service  12/29/2023  HPI/Events of Note  Pt on ins/ endotool and only has LR @ 125 running. CBG now 87.  eICU Interventions  Given that insulin  protocol has been maintained, maintain fluid protocol as well.  Reinstated D5 LR per protocol.   2339 - Lab tried to draw Beta-hydrobutyric acid X2 unsuccessful.  Last BMP 8 hours prior shows resolution of acidemia and anion gap.  Will wait for a.m. labs, anticipate the patient can be transitioned off of insulin  drip.  Intervention Category Minor Interventions: Routine modifications to care plan (e.g. PRN medications for pain, fever)  Alysiana Ethridge 12/29/2023, 9:29 PM

## 2023-12-29 NOTE — ED Notes (Signed)
 Per Claudene MD to give 500 ml of the LR bolus ordered due to pt being HD

## 2023-12-29 NOTE — H&P (Addendum)
 History and Physical    Patient: Ashley Freeman FMW:[id number] DOB: May 23, 1990 DOA: 12/29/2023 DOS: the patient was seen and examined on 12/29/2023 PCP: Keven Crumbly Pap, MD  Patient coming from: Home  Chief Complaint:  Chief Complaint  Patient presents with   Hyperglycemia   HPI: Ashley Freeman is a 33 y.o. female with medical history significant of ESRD on HD (MWF) HFrEF, DM type I, bipolar disorder, and noncompliance with multiple admissions in DKA who presents with complaints of missed dialysis and worsening pain.  She missed her scheduled dialysis session on Saturday, which was an additional treatment beyond her regular Monday, Wednesday, and Friday schedule. She reports worsening pain, primarily in her stomach and chest. She experiences shortness of breath and a mild cough without sputum production. No fever or chills are present.  She reports consuming 'Tahitian treats' (red soda), which may have contributed to elevated blood sugar levels. She denies nausea or vomiting but reports irregular bowel movements. No wounds or sores on her feet are present.  Review of records note patient had just recently been hospitalized from from 8/24-8/26 for hyperglycemia with DKA.  It appears that she left  In the ED patient was noted to be afebrile with blood pressures 158/92, and all other vital signs maintained.  Initial labs revealed glucose greater than 1200, potassium 4.3, chloride 84, CO2 15, glucose greater than 1200, BUN 33, creatinine 6.28, calcium  7.4, anion gap 19, beta hydroxybutyrate acid 3.96, BNP 4229.7, and hemoglobin 9 which appears around patient's baseline.  Venous pH was 7.289.  Chest x-ray noted cardiomegaly without any acute abnormality.  Patient had been ordered ketorolac  30 mg IV, Tigan  200 mg IM, 1258 mL of lactated Ringer 's, and started on insulin  drip per protocol.  IV access was limited and therefore there was delay in initiation of IV fluid  bolus.  Review of Systems: As mentioned in the history of present illness. All other systems reviewed and are negative. Past Medical History:  Diagnosis Date   Abscess, gluteal, right 08/24/2013   Anemia 02/19/2012   Bartholin's gland abscess 09/19/2013   Bipolar disorder (HCC)    BV (bacterial vaginosis) 11/24/2015   Depression    Diabetes mellitus type I (HCC) 2001   Diagnosed at age 78 ; Type I   Diarrhea 05/30/2016   DKA (diabetic ketoacidoses) 08/19/2013   Also in 2018   ESRD (end stage renal disease) (HCC)    Gonorrhea 08/2011   Treated in 09/2011   HFrEF (heart failure with reduced ejection fraction) (HCC)    a. 2022 Echo: EF 40%; b. 10/2021 Echo: EF 55%; b. 07/2022 MV: No ischemia. EF 31%; c. 08/2022 Echo: EF 35%, mildly dil RV, sev TR.   History of trichomoniasis 05/31/2016   Hyperlipidemia 03/28/2016   Hypertension    NICM (nonischemic cardiomyopathy) (HCC)    Sepsis (HCC) 09/19/2013   Past Surgical History:  Procedure Laterality Date   A/V FISTULAGRAM Right 06/17/2023   Procedure: A/V Fistulagram;  Surgeon: Marea Selinda GORMAN, MD;  Location: [facility];  Service: Cardiovascular;  Laterality: Right;   A/V SHUNT INTERVENTION N/A 09/25/2023   Procedure: A/V SHUNT INTERVENTION;  Surgeon: Pearline Norman GORMAN, MD;  Location: [facility];  Service: Cardiovascular;  Laterality: N/A;   AV FISTULA PLACEMENT Right 07/06/2022   Procedure: ARTERIOVENOUS GRAFT CREATION;  Surgeon: Gretta Lonni PARAS, MD;  Location: [facility] OR;  Service: Vascular;  Laterality: Right;   CESAREAN SECTION N/A 10/05/2019   Procedure: CESAREAN SECTION;  Surgeon:  Izell Harari, MD;  Location: [facility] ORS;  Service: Obstetrics;  Laterality: N/A;   CHOLECYSTECTOMY N/A 07/02/2023   Procedure: LAPAROSCOPIC CHOLECYSTECTOMY;  Surgeon: Ebbie Cough, MD;  Location: [facility] OR;  Service: General;  Laterality: N/A;   INCISION AND DRAINAGE ABSCESS Left 09/28/2019   Procedure: INCISION AND DRAINAGE VULVAR ABCESS;   Surgeon: Edsel Norleen GAILS, MD;  Location: [facility] OR;  Service: Gynecology;  Laterality: Left;   INCISION AND DRAINAGE PERIRECTAL ABSCESS Right 08/18/2013   Procedure: IRRIGATION AND DEBRIDEMENT GLUTEAL ABSCESS;  Surgeon: Lynda Leos, MD;  Location: [facility] OR;  Service: General;  Laterality: Right;   INCISION AND DRAINAGE PERIRECTAL ABSCESS Right 09/19/2013   Procedure: IRRIGATION AND DEBRIDEMENT RIGHT GLUTEAL AND LABIAL ABSCESSES;  Surgeon: Lynda Leos, MD;  Location: [facility] OR;  Service: General;  Laterality: Right;   INCISION AND DRAINAGE PERIRECTAL ABSCESS Right 09/24/2013   Procedure: IRRIGATION AND DEBRIDEMENT PERIRECTAL ABSCESS;  Surgeon: Lynwood MALVA Pina, MD;  Location: [facility] OR;  Service: General;  Laterality: Right;   IR PARACENTESIS  08/28/2023   IR PARACENTESIS  11/04/2023   IR PARACENTESIS  12/23/2023   Social History:  reports that she has never smoked. She has never been exposed to tobacco smoke. She has never used smokeless tobacco. She reports that she does not currently use alcohol. She reports that she does not use drugs.  Allergies  Allergen Reactions   Keflex  [Cephalexin ] Anaphylaxis    Ceftriaxone  in the past with no reaction   Penicillins Anaphylaxis, Hives and Rash   Vibramycin  [Doxycycline ] Anaphylaxis   Benadryl  [Diphenhydramine ] Itching   Dilaudid  [Hydromorphone ] Itching   Methotrexate Derivatives Rash   Roxicodone  [Oxycodone ] Itching    Family History  Problem Relation Age of Onset   Asthma Mother    Carpal tunnel syndrome Mother    Gout Father    Diabetes Paternal Grandmother    Anesthesia problems Neg Hx     Prior to Admission medications   Medication Sig Start Date End Date Taking? Authorizing Provider  albuterol  (VENTOLIN  HFA) 108 (90 Base) MCG/ACT inhaler Inhale 2 puffs into the lungs every 4 (four) hours as needed for wheezing or shortness of breath. 11/24/22   [provider]  atorvastatin  (LIPITOR ) 80 MG tablet Take 1 tablet (80 mg total) by mouth  daily. Patient not taking: Reported on 12/23/2023 10/15/23   Jonel Lonni SQUIBB, MD  bumetanide  (BUMEX ) 2 MG tablet Take 10 mg by mouth daily.    [provider]  calcitRIOL  (ROCALTROL ) 0.25 MCG capsule Take 5 capsules (1.25 mcg total) by mouth every Tuesday, Thursday, and Saturday at 6 PM. Patient taking differently: Take 1.25 mcg by mouth every Monday, Wednesday, and Friday. 07/09/23   Cindy Garnette POUR, MD  carvedilol  (COREG ) 25 MG tablet Take 25 mg by mouth 2 (two) times daily with a meal.    [provider]  DULoxetine  (CYMBALTA ) 20 MG capsule Take 40 mg by mouth daily. 07/29/23   [provider]  famotidine  (PEPCID ) 20 MG tablet Take 20 mg by mouth daily. 07/29/23   [provider]  fluticasone  (FLONASE ) 50 MCG/ACT nasal spray Place 2 sprays into both nostrils daily. 12/11/23 12/10/24  [provider]  hydrOXYzine  (ATARAX ) 25 MG tablet Take 25 mg by mouth 3 (three) times daily as needed for anxiety, itching, nausea or vomiting.    [provider]  insulin  aspart (NOVOLOG ) 100 UNIT/ML injection Inject 5 Units into the skin 3 (three) times daily with meals.    [provider]  lamoTRIgine  (LAMICTAL ) 200 MG tablet Take 1 tablet (200 mg total) by mouth daily. 10/29/23   Regalado, Belkys A, MD  LANTUS  SOLOSTAR 100 UNIT/ML Solostar Pen Inject 10 Units into the skin daily.    [provider]  losartan  (COZAAR ) 100 MG tablet Take 200 mg by mouth daily. 03/25/23   [provider]  OLANZapine  zydis (ZYPREXA ) 5 MG disintegrating tablet Take 5 mg by mouth at bedtime. 09/23/23 02/08/24  [provider]  omeprazole  (PRILOSEC) 40 MG capsule Take 40 mg by mouth daily. 12/11/23 02/09/24  [provider]  ondansetron  (ZOFRAN -ODT) 4 MG disintegrating tablet Take 1 tablet (4 mg total) by mouth every 8 (eight) hours as needed for nausea or vomiting. 06/23/23   Davis, Jonathon H, MD  Pancrelipase , Lip-Prot-Amyl, 3000-9500 units  CPEP Take 3,000 units of lipase by mouth 3 (three) times daily before meals. 09/23/23 02/08/24  [provider]  prochlorperazine  (COMPAZINE ) 5 MG tablet Take 5 mg by mouth every 6 (six) hours as needed for nausea or vomiting.    [provider]  sacubitril-valsartan (ENTRESTO) 24-26 MG Take 1 tablet by mouth 2 (two) times daily.    [provider]  sodium bicarbonate  650 MG tablet Take 650 mg by mouth 2 (two) times daily. 07/29/23   [provider]  SUMAtriptan  (IMITREX ) 50 MG tablet Take 50 mg by mouth every 2 (two) hours as needed for migraine or headache. 06/20/22   [provider]    Physical Exam: Vitals:   12/29/23 0529 12/29/23 0531  BP: (!) 158/92   Pulse: 96   Temp: 98.4 F (36.9 C)   SpO2: 98%   Weight:  62.9 kg  Height:  5' 3 (1.6 m)    Constitutional: Ill-appearing young female Eyes: PERRL, lids and conjunctivae normal ENMT: Mucous membranes are dry.  Fair dentition. Neck: normal, supple  Respiratory: clear to auscultation bilaterally, no wheezing, no crackles. Normal respiratory effort.    Cardiovascular: Regular rate and rhythm, no murmurs / rubs / gallops.  Legs +1 pitting lower extremity edema.  Right upper extremity fistula present with palpable thrill Abdomen: Protuberant abdomen with some tenderness to palpation. Bowel sounds positive.  Musculoskeletal: no clubbing / cyanosis. No joint deformity upper and lower extremities. Good ROM, no contractures. Normal muscle tone.  Skin: no rashes, lesions, ulcers. No induration Neurologic: CN 2-12 grossly intact.   Strength 5/5 in all 4.  Psychiatric:   Alert and oriented x 3. Normal mood.   Data Reviewed:  EKG reveals sinus rhythm at 95 bpm with QTc 539.  Reviewed labs, imaging, and pertinent records as documented.  Assessment and Plan:  DKA, type I Patient presents with glucose greater than 1200, CO2 15, anion gap 19, and beta hydroxybutyrate acid 3.46.  Venous pH was 7.289.   Findings consistent with diabetic ketoacidosis.  Last available hemoglobin A1c noted to be 10.8 when checked on 8/22.  Patient was started on a insulin  drip. - Admit to progressive bed - Hyperglycemia order set utilized - Continue insulin  drip per protocol - Held additional IV fluids due to patient being fluid overloaded - Correcting electrolyte abnormalities as needed - Serial BMPs every 4 hours - Monitoring for closure of anion gap to transition back to subcu insulin   Fluid overload secondary to ESRD on HD and combined systolic and diastolic CHF Acute on chronic patient complains of feeling short of breath, but O2 saturations currently obtained on room air.  Patient with some lower extremity swelling on physical exam,  but lungs sound fairly clear.  Reported to have needed extra dialysis session on Saturday, but missed it.  Labs noted potassium 4.5, BUN 33, creatinine 6.28, and BNP 4229.7.  BNP appears just slightly lower than patient's baseline when she has come to the hospital which is usually greater than 4500.  Last echocardiogram noted EF to be 30 to 35% with grade 2 diastolic dysfunction when checked last 08/29/2023. - Check renal function panel daily - Nephrology consulted for the need of dialysis during hospital stay  Pseudohyponatremia Sodium noted to be as low as 118, but when adjusted for hyperglycemia noted to be within normal limits. - Continue to monitor  Prolonged QT interval Acute.  QTc 539. - Avoid additional QT prolonging medications  Anemia of chronic disease Hemoglobin noted to be 10.9 which appears around patient baseline. - Continue to monitor  Hypertension Blood pressures initially elevated up to 158/92. - Resume Coreg   Bipolar disorder - Continue home medication regimen  DVT prophylaxis: Heparin  Advance Care Planning:   Code Status: Full Code   Consults: Nephrology  Family Communication: None requested  Severity of Illness: The appropriate patient  status for this patient is INPATIENT. Inpatient status is judged to be reasonable and necessary in order to provide the required intensity of service to ensure the patient's safety. The patient's presenting symptoms, physical exam findings, and initial radiographic and laboratory data in the context of their chronic comorbidities is felt to place them at high risk for further clinical deterioration. Furthermore, it is not anticipated that the patient will be medically stable for discharge from the hospital within 2 midnights of admission.   * I certify that at the point of admission it is my clinical judgment that the patient will require inpatient hospital care spanning beyond 2 midnights from the point of admission due to high intensity of service, high risk for further deterioration and high frequency of surveillance required.*  Author: Maximino DELENA Sharps, MD 12/29/2023 7:16 AM  For on call review [email or web address].

## 2023-12-29 NOTE — ED Notes (Signed)
 Phleb at bedside to assist with blood draw collection

## 2023-12-29 NOTE — ED Notes (Signed)
 Hourly rounding complete. Pt alert or resting, no distress noted, offered toileting and diet as appropriate. Side rails up, call light within reach. Pt denies pain or further needs at this time.  Awaiting verification of pain medication. Pt verbalizes understanding of pain medication risks and benefits. Pt agrees with fall precuations

## 2023-12-29 NOTE — ED Notes (Signed)
 Due to difficult IV access, Lab Tech attempting straight stick for I Stat

## 2023-12-29 NOTE — Consult Note (Signed)
 [facility] Renal Consultation Note    Indication for Consultation:  Management of ESRD/hemodialysis, anemia, hypertension/volume, and secondary hyperparathyroidism.  HPI: Ashley Freeman is a 33 y.o. female with ESRD on HD MWF, HTN, T1DM, HL, HFrEF who was admitted with DKA.  Presented to ED early this AM from home with abdominal pain and nausea since last night. Vitals were ok, she was afebrile. Labs showed Na 118, K 4.3, CO2 15, BS >1200, BUN 33, Cr 6.2, Ca 7.4, WBC 10, Hgb 9. She was given Toradol  for pain and started on IV insulin  and LR for DKA and posted for admission. BS remains > 600 on last check.  Seen in room. She reports that her BS was ok at home, but drank cherry soda last night which she attributes to the above. Was just discharged 12/24/23 after 2-d admit for the same as well as volume overload where she left AMA, then s/p admit 8/21-22/25 with the same. Frequent admits with overload requiring serial HD to improve. No recent illnesses or fever. No CP or dyspnea at the moment. Some nausea, no vomiting at the moment.  Dialyzes at [facility] clinic on MWF schedule (recently changed). She thinks that her dry weight is 63kg, but she is not sure. Uses AVG without recent issues.  Past Medical History:  Diagnosis Date   Abscess, gluteal, right 08/24/2013   Anemia 02/19/2012   Bartholin's gland abscess 09/19/2013   Bipolar disorder (HCC)    BV (bacterial vaginosis) 11/24/2015   Depression    Diabetes mellitus type I (HCC) 2001   Diagnosed at age 89 ; Type I   Diarrhea 05/30/2016   DKA (diabetic ketoacidoses) 08/19/2013   Also in 2018   ESRD (end stage renal disease) (HCC)    Gonorrhea 08/2011   Treated in 09/2011   HFrEF (heart failure with reduced ejection fraction) (HCC)    a. 2022 Echo: EF 40%; b. 10/2021 Echo: EF 55%; b. 07/2022 MV: No ischemia. EF 31%; c. 08/2022 Echo: EF 35%, mildly dil RV, sev TR.   History of trichomoniasis 05/31/2016    Hyperlipidemia 03/28/2016   Hypertension    NICM (nonischemic cardiomyopathy) (HCC)    Sepsis (HCC) 09/19/2013   Past Surgical History:  Procedure Laterality Date   A/V FISTULAGRAM Right 06/17/2023   Procedure: A/V Fistulagram;  Surgeon: Marea Selinda GORMAN, MD;  Location: [facility] INVASIVE CV LAB;  Service: Cardiovascular;  Laterality: Right;   A/V SHUNT INTERVENTION N/A 09/25/2023   Procedure: A/V SHUNT INTERVENTION;  Surgeon: Pearline Norman GORMAN, MD;  Location: [facility] PV LAB;  Service: Cardiovascular;  Laterality: N/A;   AV FISTULA PLACEMENT Right 07/06/2022   Procedure: ARTERIOVENOUS GRAFT CREATION;  Surgeon: Gretta Lonni PARAS, MD;  Location: [facility] OR;  Service: Vascular;  Laterality: Right;   CESAREAN SECTION N/A 10/05/2019   Procedure: CESAREAN SECTION;  Surgeon: Izell Harari, MD;  Location: [facility] LD ORS;  Service: Obstetrics;  Laterality: N/A;   CHOLECYSTECTOMY N/A 07/02/2023   Procedure: LAPAROSCOPIC CHOLECYSTECTOMY;  Surgeon: Ebbie Cough, MD;  Location: [facility] OR;  Service: General;  Laterality: N/A;   INCISION AND DRAINAGE ABSCESS Left 09/28/2019   Procedure: INCISION AND DRAINAGE VULVAR ABCESS;  Surgeon: Edsel Norleen GAILS, MD;  Location: [facility] OR;  Service: Gynecology;  Laterality: Left;   INCISION AND DRAINAGE PERIRECTAL ABSCESS Right 08/18/2013   Procedure: IRRIGATION AND DEBRIDEMENT GLUTEAL ABSCESS;  Surgeon: Lynda Leos, MD;  Location: [facility] OR;  Service: General;  Laterality: Right;   INCISION AND DRAINAGE PERIRECTAL ABSCESS Right 09/19/2013  Procedure: IRRIGATION AND DEBRIDEMENT RIGHT GLUTEAL AND LABIAL ABSCESSES;  Surgeon: Lynda Leos, MD;  Location: [facility] OR;  Service: General;  Laterality: Right;   INCISION AND DRAINAGE PERIRECTAL ABSCESS Right 09/24/2013   Procedure: IRRIGATION AND DEBRIDEMENT PERIRECTAL ABSCESS;  Surgeon: Lynwood MALVA Pina, MD;  Location: [facility] OR;  Service: General;  Laterality: Right;   IR PARACENTESIS  08/28/2023   IR PARACENTESIS  11/04/2023   IR PARACENTESIS  12/23/2023    Family History  Problem Relation Age of Onset   Asthma Mother    Carpal tunnel syndrome Mother    Gout Father    Diabetes Paternal Grandmother    Anesthesia problems Neg Hx    Social History:  reports that she has never smoked. She has never been exposed to tobacco smoke. She has never used smokeless tobacco. She reports that she does not currently use alcohol. She reports that she does not use drugs.  ROS: As per HPI otherwise negative.  Physical Exam: Vitals:   12/29/23 0531 12/29/23 0744 12/29/23 0800 12/29/23 0930  BP:   (!) 164/84 (!) 175/85  Pulse:   93 95  Resp:  (!) 24  16  Temp:  98.7 F (37.1 C)    SpO2:   100% 100%  Weight: 62.9 kg     Height: 5' 3 (1.6 m)        General: Well developed woman, NAD. Using Norman O2 - does have facial edema. Head: Normocephalic, atraumatic, sclera non-icteric, mucus membranes are moist. Neck: Supple without lymphadenopathy/masses.  Lungs: Clear bilaterally to auscultation without wheezes, rales, or rhonchi. Breathing is unlabored. Heart: RRR with normal S1, S2; 2/6 systolic murmur Abdomen: Tense, distended with firm edema in RLQ Musculoskeletal:  Strength and tone appear normal for age. Lower extremities: 2+ BLE tense edema Neuro: Alert and oriented X 3. Moves all extremities spontaneously. Psych:  Responds to questions appropriately with a normal affect. Dialysis Access: RUE AVG +t/b  Allergies  Allergen Reactions   Keflex  [Cephalexin ] Anaphylaxis    Ceftriaxone  in the past with no reaction   Penicillins Anaphylaxis, Hives and Rash   Vibramycin  [Doxycycline ] Anaphylaxis   Benadryl  [Diphenhydramine ] Itching   Dilaudid  [Hydromorphone ] Itching   Methotrexate Derivatives Rash   Roxicodone  [Oxycodone ] Itching   Prior to Admission medications   Medication Sig Start Date End Date Taking? Authorizing Provider  albuterol  (VENTOLIN  HFA) 108 (90 Base) MCG/ACT inhaler Inhale 2 puffs into the lungs every 4 (four) hours as needed for  wheezing or shortness of breath. 11/24/22   [provider]  atorvastatin  (LIPITOR ) 80 MG tablet Take 1 tablet (80 mg total) by mouth daily. Patient not taking: Reported on 12/23/2023 10/15/23   Jonel Lonni SQUIBB, MD  bumetanide  (BUMEX ) 2 MG tablet Take 10 mg by mouth daily.    [provider]  calcitRIOL  (ROCALTROL ) 0.25 MCG capsule Take 5 capsules (1.25 mcg total) by mouth every Tuesday, Thursday, and Saturday at 6 PM. Patient taking differently: Take 1.25 mcg by mouth every Monday, Wednesday, and Friday. 07/09/23   Cindy Garnette POUR, MD  carvedilol  (COREG ) 25 MG tablet Take 25 mg by mouth 2 (two) times daily with a meal.    [provider]  DULoxetine  (CYMBALTA ) 20 MG capsule Take 40 mg by mouth daily. 07/29/23   [provider]  famotidine  (PEPCID ) 20 MG tablet Take 20 mg by mouth daily. 07/29/23   [provider]  fluticasone  (FLONASE ) 50 MCG/ACT nasal spray Place 2 sprays into both nostrils daily. 12/11/23 12/10/24  [provider]  hydrOXYzine  (ATARAX ) 25 MG tablet Take 25 mg by mouth 3 (three) times daily as needed for anxiety, itching, nausea or vomiting.    [provider]  insulin  aspart (NOVOLOG ) 100 UNIT/ML injection Inject 5 Units into the skin 3 (three) times daily with meals.    [provider]  lamoTRIgine  (LAMICTAL ) 200 MG tablet Take 1 tablet (200 mg total) by mouth daily. 10/29/23   Regalado, Belkys A, MD  LANTUS  SOLOSTAR 100 UNIT/ML Solostar Pen Inject 10 Units into the skin daily.    [provider]  losartan  (COZAAR ) 100 MG tablet Take 200 mg by mouth daily. 03/25/23   [provider]  OLANZapine  zydis (ZYPREXA ) 5 MG disintegrating tablet Take 5 mg by mouth at bedtime. 09/23/23 02/08/24  [provider]  omeprazole  (PRILOSEC) 40 MG capsule Take 40 mg by mouth daily. 12/11/23 02/09/24  [provider]  ondansetron  (ZOFRAN -ODT) 4 MG disintegrating tablet Take 1 tablet (4 mg total)  by mouth every 8 (eight) hours as needed for nausea or vomiting. 06/23/23   Davis, Jonathon H, MD  Pancrelipase , Lip-Prot-Amyl, 3000-9500 units CPEP Take 3,000 units of lipase by mouth 3 (three) times daily before meals. 09/23/23 02/08/24  [provider]  prochlorperazine  (COMPAZINE ) 5 MG tablet Take 5 mg by mouth every 6 (six) hours as needed for nausea or vomiting.    [provider]  sacubitril-valsartan (ENTRESTO) 24-26 MG Take 1 tablet by mouth 2 (two) times daily.    [provider]  sodium bicarbonate  650 MG tablet Take 650 mg by mouth 2 (two) times daily. 07/29/23   [provider]  SUMAtriptan  (IMITREX ) 50 MG tablet Take 50 mg by mouth every 2 (two) hours as needed for migraine or headache. 06/20/22   [provider]   Current Facility-Administered Medications  Medication Dose Route Frequency Provider Last Rate Last Admin   acetaminophen  (TYLENOL ) tablet 650 mg  650 mg Oral Q6H PRN Smith, Rondell A, MD       Or   acetaminophen  (TYLENOL ) suppository 650 mg  650 mg Rectal Q6H PRN Claudene Maximino LABOR, MD       albuterol  (PROVENTIL ) (2.5 MG/3ML) 0.083% nebulizer solution 2.5 mg  2.5 mg Nebulization Q4H PRN Smith, Rondell A, MD       carvedilol  (COREG ) tablet 25 mg  25 mg Oral BID WC Smith, Rondell A, MD       dextrose  5 % in lactated ringers  infusion   Intravenous Continuous Claudene, Rondell A, MD       dextrose  50 % solution 0-50 mL  0-50 mL Intravenous PRN Palumbo, April, MD       heparin  injection 5,000 Units  5,000 Units Subcutaneous Q8H Smith, Rondell A, MD   5,000 Units at 12/29/23 0825   HYDROmorphone  (DILAUDID ) injection 1 mg  1 mg Intravenous Q4H PRN Claudene Maximino A, MD       insulin  regular, human (MYXREDLIN ) 100 units/ 100 mL infusion   Intravenous Continuous Palumbo, April, MD 5.5 mL/hr at 12/29/23 0709 5.5 Units/hr at 12/29/23 9290   lactated ringers  infusion   Intravenous Continuous Claudene Maximino A, MD 75 mL/hr at 12/29/23 0748 75 mL/hr at  12/29/23 0748   sodium bicarbonate  tablet 650 mg  650 mg Oral BID Smith, Rondell A, MD       sodium chloride  flush (NS) 0.9 % injection 3 mL  3 mL Intravenous Q12H Smith, Rondell A, MD       trimethobenzamide  (TIGAN ) injection  200 mg  200 mg Intramuscular Q6H PRN Claudene Maximino LABOR, MD       Labs: Basic Metabolic Panel: Recent Labs  Lab 12/24/23 0459 12/29/23 0538 12/29/23 0620 12/29/23 0947  NA 133* 118* 119*  117* 120*  K 4.3 4.3 4.5  4.5 3.6  CL 98 84* 87* 86*  CO2 23 15*  --  14*  GLUCOSE 84 >1,200* >700* 1,114*  BUN 29* 33* 36* 33*  CREATININE 5.78* 6.28* 6.20* 6.40*  CALCIUM  8.2* 7.4*  --  7.5*  PHOS 5.8*  --   --   --    Liver Function Tests: Recent Labs  Lab 12/23/23 1706 12/24/23 0459  AST  --  18  ALT  --  20  ALKPHOS  --  486*  BILITOT  --  0.7  PROT  --  4.7*  ALBUMIN  2.6* 2.2*   CBC: Recent Labs  Lab 12/22/23 1235 12/22/23 1942 12/24/23 0459 12/29/23 0538 12/29/23 0620  WBC 11.6* 11.7* 9.1 10.0  --   NEUTROABS  --  7.4 4.4  --   --   HGB 8.1* 8.5* 8.1* 9.0* 10.9*  10.2*  HCT 26.7* 27.4* 25.8* 30.7* 32.0*  30.0*  MCV 89.9 88.1 87.8 94.2  --   PLT 324 317 299 282  --    CBG: Recent Labs  Lab 12/29/23 0739 12/29/23 0814 12/29/23 0844 12/29/23 0915 12/29/23 0956  GLUCAP >600* >600* >600* >600* >600*   Studies/Results: DG Chest 2 View Result Date: 12/29/2023 EXAM: 2 VIEW(S) XRAY OF THE CHEST 12/29/2023 05:49:00 AM COMPARISON: 12/22/2023 CLINICAL HISTORY: SOB FINDINGS: LUNGS AND PLEURA: No focal pulmonary opacity. No pulmonary edema. No pleural effusion. No pneumothorax. HEART AND MEDIASTINUM: Cardiomegaly, stable. BONES AND SOFT TISSUES: No acute osseous abnormality. IMPRESSION: 1. No acute findings. 2. Cardiomegaly, stable. Electronically signed by: Waddell Calk MD 12/29/2023 06:01 AM EDT RP Workstation: [id number]    Dialysis Orders:  MWF - [facility] [address] 3:45hr, BFR 350, EDW 60kg, AVG, heparin  1600 unit bolus + 600u/hr  -  As of last admit -> will call on Monday 9/1 to verify  Assessment/Plan:  DKA, uncontrolled T1D: BS > 1200 on admit, with acidosis and pseudohyponatremia. On IV insulin  drip currently. Was also getting IVF but this has now been discontinued as she is overloaded on exam. BS remains > 600, Na improving.  Chest/abdominal pain: ?due to #1, per primary.  ESRD: Usual MWF schedule - for HD tomorrow. Frequent admissions with DKA and overload.   Hypertension/volume overload: BP high with LE and abd wall edema + ascites, plan for large UFG tomorrow.  Anemia of ESRD: Hgb 10.2 - follow without ESA for now.  Metabolic bone disease: CorrCa ok, continue home meds for now.  Bipolar disorder  Izetta Boehringer, DEVONNA 12/29/2023, 11:05 AM  [employer]

## 2023-12-29 NOTE — Plan of Care (Signed)
  Problem: Education: Goal: Knowledge of General Education information will improve Description: Including pain rating scale, medication(s)/side effects and non-pharmacologic comfort measures Outcome: Progressing   Problem: Health Behavior/Discharge Planning: Goal: Ability to manage health-related needs will improve Outcome: Progressing   Problem: Clinical Measurements: Goal: Ability to maintain clinical measurements within normal limits will improve Outcome: Progressing Goal: Will remain free from infection Outcome: Progressing Goal: Diagnostic test results will improve Outcome: Progressing Goal: Respiratory complications will improve Outcome: Progressing Goal: Cardiovascular complication will be avoided Outcome: Progressing   Problem: Activity: Goal: Risk for activity intolerance will decrease Outcome: Progressing   Problem: Nutrition: Goal: Adequate nutrition will be maintained Outcome: Progressing   Problem: Coping: Goal: Level of anxiety will decrease Outcome: Progressing   Problem: Elimination: Goal: Will not experience complications related to bowel motility Outcome: Progressing Goal: Will not experience complications related to urinary retention Outcome: Progressing   Problem: Pain Managment: Goal: General experience of comfort will improve and/or be controlled Outcome: Progressing   Problem: Safety: Goal: Ability to remain free from injury will improve Outcome: Progressing   Problem: Skin Integrity: Goal: Risk for impaired skin integrity will decrease Outcome: Progressing   Problem: Education: Goal: Ability to describe self-care measures that may prevent or decrease complications (Diabetes Survival Skills Education) will improve Outcome: Progressing Goal: Individualized Educational Video(s) Outcome: Progressing   Problem: Coping: Goal: Ability to adjust to condition or change in health will improve Outcome: Progressing   Problem: Fluid  Volume: Goal: Ability to maintain a balanced intake and output will improve Outcome: Progressing   Problem: Health Behavior/Discharge Planning: Goal: Ability to identify and utilize available resources and services will improve Outcome: Progressing Goal: Ability to manage health-related needs will improve Outcome: Progressing   Problem: Metabolic: Goal: Ability to maintain appropriate glucose levels will improve Outcome: Progressing   Problem: Nutritional: Goal: Maintenance of adequate nutrition will improve Outcome: Progressing Goal: Progress toward achieving an optimal weight will improve Outcome: Progressing   Problem: Skin Integrity: Goal: Risk for impaired skin integrity will decrease Outcome: Progressing   Problem: Tissue Perfusion: Goal: Adequacy of tissue perfusion will improve Outcome: Progressing   Problem: Education: Goal: Ability to describe self-care measures that may prevent or decrease complications (Diabetes Survival Skills Education) will improve Outcome: Progressing Goal: Individualized Educational Video(s) Outcome: Progressing   Problem: Cardiac: Goal: Ability to maintain an adequate cardiac output will improve Outcome: Progressing   Problem: Health Behavior/Discharge Planning: Goal: Ability to identify and utilize available resources and services will improve Outcome: Progressing Goal: Ability to manage health-related needs will improve Outcome: Progressing   Problem: Fluid Volume: Goal: Ability to achieve a balanced intake and output will improve Outcome: Progressing   Problem: Metabolic: Goal: Ability to maintain appropriate glucose levels will improve Outcome: Progressing   Problem: Nutritional: Goal: Maintenance of adequate nutrition will improve Outcome: Progressing Goal: Maintenance of adequate weight for body size and type will improve Outcome: Progressing   Problem: Respiratory: Goal: Will regain and/or maintain adequate  ventilation Outcome: Progressing   Problem: Urinary Elimination: Goal: Ability to achieve and maintain adequate renal perfusion and functioning will improve Outcome: Progressing

## 2023-12-29 NOTE — ED Notes (Signed)
 Patient reports that she went to dialysis on Sat due to missing Fri dialysis

## 2023-12-30 ENCOUNTER — Inpatient Hospital Stay (HOSPITAL_COMMUNITY)

## 2023-12-30 DIAGNOSIS — E1010 Type 1 diabetes mellitus with ketoacidosis without coma: Secondary | ICD-10-CM | POA: Diagnosis not present

## 2023-12-30 DIAGNOSIS — I5043 Acute on chronic combined systolic (congestive) and diastolic (congestive) heart failure: Secondary | ICD-10-CM | POA: Diagnosis not present

## 2023-12-30 DIAGNOSIS — N186 End stage renal disease: Secondary | ICD-10-CM | POA: Diagnosis not present

## 2023-12-30 DIAGNOSIS — E8770 Fluid overload, unspecified: Secondary | ICD-10-CM | POA: Diagnosis not present

## 2023-12-30 DIAGNOSIS — E871 Hypo-osmolality and hyponatremia: Secondary | ICD-10-CM

## 2023-12-30 LAB — GLUCOSE, CAPILLARY
Glucose-Capillary: 102 mg/dL — ABNORMAL HIGH (ref 70–99)
Glucose-Capillary: 109 mg/dL — ABNORMAL HIGH (ref 70–99)
Glucose-Capillary: 130 mg/dL — ABNORMAL HIGH (ref 70–99)
Glucose-Capillary: 132 mg/dL — ABNORMAL HIGH (ref 70–99)
Glucose-Capillary: 134 mg/dL — ABNORMAL HIGH (ref 70–99)
Glucose-Capillary: 135 mg/dL — ABNORMAL HIGH (ref 70–99)
Glucose-Capillary: 135 mg/dL — ABNORMAL HIGH (ref 70–99)
Glucose-Capillary: 138 mg/dL — ABNORMAL HIGH (ref 70–99)
Glucose-Capillary: 151 mg/dL — ABNORMAL HIGH (ref 70–99)
Glucose-Capillary: 156 mg/dL — ABNORMAL HIGH (ref 70–99)
Glucose-Capillary: 169 mg/dL — ABNORMAL HIGH (ref 70–99)
Glucose-Capillary: 175 mg/dL — ABNORMAL HIGH (ref 70–99)
Glucose-Capillary: 62 mg/dL — ABNORMAL LOW (ref 70–99)
Glucose-Capillary: 65 mg/dL — ABNORMAL LOW (ref 70–99)
Glucose-Capillary: 90 mg/dL (ref 70–99)
Glucose-Capillary: 99 mg/dL (ref 70–99)
Glucose-Capillary: 99 mg/dL (ref 70–99)

## 2023-12-30 LAB — RENAL FUNCTION PANEL
Albumin: 2.4 g/dL — ABNORMAL LOW (ref 3.5–5.0)
Anion gap: 13 (ref 5–15)
BUN: 34 mg/dL — ABNORMAL HIGH (ref 6–20)
CO2: 21 mmol/L — ABNORMAL LOW (ref 22–32)
Calcium: 7.8 mg/dL — ABNORMAL LOW (ref 8.9–10.3)
Chloride: 92 mmol/L — ABNORMAL LOW (ref 98–111)
Creatinine, Ser: 6.35 mg/dL — ABNORMAL HIGH (ref 0.44–1.00)
GFR, Estimated: 8 mL/min — ABNORMAL LOW (ref >60)
Glucose, Bld: 162 mg/dL — ABNORMAL HIGH (ref 70–99)
Phosphorus: 4.6 mg/dL (ref 2.5–4.6)
Potassium: 3.9 mmol/L (ref 3.5–5.1)
Sodium: 126 mmol/L — ABNORMAL LOW (ref 135–145)

## 2023-12-30 LAB — CBC
HCT: 32 % — ABNORMAL LOW (ref 36.0–46.0)
Hemoglobin: 10.5 g/dL — ABNORMAL LOW (ref 12.0–15.0)
MCH: 27.9 pg (ref 26.0–34.0)
MCHC: 32.8 g/dL (ref 30.0–36.0)
MCV: 85.1 fL (ref 80.0–100.0)
Platelets: 295 K/uL (ref 150–400)
RBC: 3.76 MIL/uL — ABNORMAL LOW (ref 3.87–5.11)
RDW: 16.1 % — ABNORMAL HIGH (ref 11.5–15.5)
WBC: 12.5 K/uL — ABNORMAL HIGH (ref 4.0–10.5)
nRBC: 0 % (ref 0.0–0.2)

## 2023-12-30 MED ORDER — LIDOCAINE HCL (PF) 1 % IJ SOLN
5.0000 mL | INTRAMUSCULAR | Status: DC | PRN
Start: 2023-12-30 — End: 2023-12-31

## 2023-12-30 MED ORDER — DEXTROSE 50 % IV SOLN
12.5000 g | INTRAVENOUS | Status: AC
  Administered 2023-12-31: 12.5 g via INTRAVENOUS

## 2023-12-30 MED ORDER — POTASSIUM CHLORIDE 10 MEQ/100ML IV SOLN
10.0000 meq | INTRAVENOUS | Status: AC
  Administered 2023-12-30 (×2): 10 meq via INTRAVENOUS
  Filled 2023-12-30 (×2): qty 100

## 2023-12-30 MED ORDER — PENTAFLUOROPROP-TETRAFLUOROETH EX AERO
1.0000 | INHALATION_SPRAY | CUTANEOUS | Status: DC | PRN
Start: 2023-12-30 — End: 2023-12-31

## 2023-12-30 MED ORDER — INSULIN ASPART 100 UNIT/ML IJ SOLN: 0.0000 [IU] | Freq: Three times a day (TID) | INTRAMUSCULAR | Status: DC

## 2023-12-30 MED ORDER — METOCLOPRAMIDE HCL 5 MG/ML IJ SOLN
10.0000 mg | Freq: Three times a day (TID) | INTRAMUSCULAR | Status: DC
  Administered 2023-12-30 – 2023-12-31 (×2): 10 mg via INTRAVENOUS
  Filled 2023-12-30 (×2): qty 2

## 2023-12-30 MED ORDER — METOCLOPRAMIDE HCL 5 MG/ML IJ SOLN: 10.0000 mg | Freq: Three times a day (TID) | INTRAMUSCULAR | Status: DC

## 2023-12-30 MED ORDER — INSULIN ASPART 100 UNIT/ML IJ SOLN: 0.0000 [IU] | Freq: Every day | INTRAMUSCULAR | Status: DC

## 2023-12-30 MED ORDER — INSULIN GLARGINE 100 UNIT/ML ~~LOC~~ SOLN
10.0000 [IU] | Freq: Every day | SUBCUTANEOUS | Status: DC
  Administered 2023-12-30 – 2023-12-31 (×2): 10 [IU] via SUBCUTANEOUS
  Filled 2023-12-30 (×2): qty 0.1

## 2023-12-30 MED ORDER — ONDANSETRON 4 MG PO TBDP
8.0000 mg | ORAL_TABLET | Freq: Once | ORAL | Status: AC
  Administered 2023-12-30: 8 mg via ORAL
  Filled 2023-12-30: qty 2

## 2023-12-30 MED ORDER — HEPARIN SODIUM (PORCINE) 1000 UNIT/ML DIALYSIS
20.0000 [IU]/kg | INTRAMUSCULAR | Status: DC | PRN
  Administered 2023-12-30: 1300 [IU] via INTRAVENOUS_CENTRAL
  Filled 2023-12-30: qty 2

## 2023-12-30 MED ORDER — SODIUM CHLORIDE 0.9 % IV SOLN: INTRAVENOUS | Status: AC | PRN

## 2023-12-30 MED ORDER — PROCHLORPERAZINE EDISYLATE 10 MG/2ML IJ SOLN: 10.0000 mg | INTRAMUSCULAR | Status: DC | PRN

## 2023-12-30 MED ORDER — INSULIN GLARGINE 100 UNIT/ML ~~LOC~~ SOLN
10.0000 [IU] | Freq: Every day | SUBCUTANEOUS | Status: DC
  Filled 2023-12-30: qty 0.1

## 2023-12-30 MED ORDER — LIDOCAINE-PRILOCAINE 2.5-2.5 % EX CREA: 1.0000 | TOPICAL_CREAM | CUTANEOUS | Status: DC | PRN

## 2023-12-30 MED ORDER — DEXTROSE 5 % IV SOLN: INTRAVENOUS | Status: DC

## 2023-12-30 MED ORDER — INSULIN GLARGINE-YFGN 100 UNIT/ML ~~LOC~~ SOLN: 10.0000 [IU] | SUBCUTANEOUS | Status: DC

## 2023-12-30 NOTE — Progress Notes (Signed)
 IV Team consult to place midline. IV Team has exhausted all efforts to obtain additional access on this pt. Midline would have to be approved by renal MD, but is not appropriate for this pt. Please consider placing central line. Primary RN also notified.

## 2023-12-30 NOTE — Progress Notes (Signed)
 Overnight floor coverage  Last BMP drawn at 4:28 PM was showing potassium 3.5, bicarb 19, glucose 454, and anion gap 15, beta hydroxybutyric acid was 0.08.  Patient was given oral potassium 40 mEq at that time.  Most recent CBG 175.  Informed by RN that they have not been able to draw repeat labs despite multiple attempts.  Also informed by RN that patient has abdominal distention and has had 2 episodes of vomiting tonight.  As such, she is not able to tolerate p.o. intake and cannot be safely transitioned to subcutaneous insulin  at this time.  Since patient continues to be on insulin  drip, additional IV potassium 10 mEq x 2 ordered to avoid hypokalemia.  Stat CT abdomen pelvis ordered to rule out bowel obstruction.

## 2023-12-30 NOTE — Progress Notes (Signed)
 [facility] Progress Note  Subjective:  No labs this am Last lab yest evening showed K+ 3.5, creat 6.3 92% on RA  Vitals:   12/30/23 0800 12/30/23 0900 12/30/23 1033 12/30/23 1116  BP: (!) 167/80 (!) 162/83    Pulse: 76 75    Resp: 13 (!) 8    Temp:    98 F (36.7 C)  TempSrc:    Axillary  SpO2: 97% 98%    Weight:   74.6 kg   Height:        Exam: General: Ashley Freeman O2, facial edema, alert Lungs: Clear bilaterally to auscultation Heart: RRR with normal S1, S2; 2/6 systolic murmur Abdomen: Tense, distended with firm edema in RLQ Lower extremities: 2+ BLE tense edema Neuro: Alert and oriented X 3.      OP HD: MWF [facility] (recently changed) Edw ~ 63kg , per pt , using AVG, hep 1600 bolus +600/hr    CXR 8/341 - no acute disease  Assessment/ Plan: DKA/ uncontrolled T1D: BS > 1200 on admit. Avoid LR/ NS due to vol overload. Okay to use D5W to keep BS up. BS 125- 175 range this am. Per pmd.   ESRD: MWF HD. HD this afternoon.   Hypertension/volume overload: BP high with LE and abd wall edema + ascites. Large UF goal today.  Anemia of ESRD: Hgb 10.2 - follow without ESA for now.  Bipolar disorder   Myer Fret MD  [facility] 12/30/2023, 11:42 AM  Recent Labs  Lab 12/23/23 1706 12/23/23 1706 12/24/23 0459 12/29/23 0538 12/29/23 0620 12/29/23 0947 12/29/23 1420 12/29/23 1628  HGB  --    < > 8.1* 9.0* 10.9*  10.2*  --   --   --   ALBUMIN  2.6*  --  2.2*  --   --   --   --   --   CALCIUM  8.8*  --  8.2* 7.4*  --    < > 7.7* 7.7*  PHOS  --   --  5.8*  --   --   --   --   --   CREATININE 5.11*  --  5.78* 6.28* 6.20*   < > 6.30* 6.43*  K 4.8  --  4.3 4.3 4.5  4.5   < > 3.5 3.5   < > = values in this interval not displayed.   No results for input(s): IRON , TIBC, FERRITIN in the last 168 hours. Inpatient medications:  carvedilol   25 mg Oral BID WC   Chlorhexidine  Gluconate Cloth  6 each Topical Daily   heparin   5,000 Units Subcutaneous Q8H   sodium  bicarbonate  650 mg Oral BID   sodium chloride  flush  3 mL Intravenous Q12H    sodium chloride      dextrose  5% lactated ringers  50 mL/hr at 12/30/23 0900   insulin  0.4 Units/hr (12/30/23 1100)   lactated ringers  50 mL/hr at 12/30/23 0900   Place/Maintain arterial line **AND** sodium chloride , acetaminophen  **OR** acetaminophen , albuterol , dextrose , hydrALAZINE , HYDROmorphone  (DILAUDID ) injection, hydrOXYzine , trimethobenzamide 

## 2023-12-30 NOTE — Progress Notes (Signed)
 PCCM interval note  Moved to ICU/stepdown setting to facilitate glucose checks, lab work, etc. Patient remains hemodynamically stable, comfortable Unfortunately had been unable to get labs reliably, IV team has attempted, RT unable to place art line.  Continuing D5 LR and insulin  infusion HD planned, HD RN coming to obtain labs via her fistula access  Will follow peripherally  Lamar Chris, MD, PhD 12/30/2023, 8:27 AM  Pulmonary and Critical Care [phone] or if no answer before 7:00PM call [phone] For any issues after 7:00PM please call eLink [phone]

## 2023-12-30 NOTE — Progress Notes (Signed)
 Aline attempted x2 unsuccessfully in the L radial artery. Minimal blood loss, patient tolerated well. Good blood flow was obtained, unable to thread the catheter. RN notified.

## 2023-12-30 NOTE — Progress Notes (Signed)
 Progress Note   Patient: Ashley Freeman FMW:[id number] DOB: 12-23-1990 DOA: 12/29/2023     1 DOS: the patient was seen and examined on 12/30/2023   Brief hospital course: Ashley Freeman is a 33 y.o. female with medical history significant of ESRD on HD TTS, HFrEF, DM type I, bipolar disorder, and noncompliance with multiple admissions for DKA presented with complaints of missed dialysis and worsening pain. Initial labs in ED revealed glucose greater than 1200, potassium 4.3, chloride 84, CO2 15, BUN 33, creatinine 6.28, calcium  7.4, anion gap 19, beta hydroxybutyrate acid 3.96, BNP 4229.7.  Venous pH was 7.289.  Chest x-ray noted cardiomegaly.  She is admitted for DKA in SDU for insulin  gtt, fluids per endotool.  Assessment and Plan: DKA type 1 Uncontrolled type 1 diabetes, h/o multiple prior admission for DKA. She is lethargic, sleepy, had nausea, abdominal discomfort, unable to tolerate oral. Continue insulin  gtt, D5  per protocol. Continue to monitor electrolytes, blood sugars closely. Labs delayed as she has poor IV access. She is being followed by CCM team.  Once more alert, awake, able to tolerate diet will transition her insulin  to SQ. Repeat labs reviewed. Discussed with pharmacy.  ESRD on HD- Fluid overload due to missed HD, combined CHF. MWF schedule, missed HD due to sickness. Nephrology on board for HD needs.  HFrEF- Caution with fluid overload. Fluid management with HD.  Hyponatremia In the setting of hypervolemia. Sodium 126 improved with sugars from 118. Prior records show loa Na. HD will help sodium. Continue to monitor   Prolonged QT interval Acute.  QTc 539. Avoid additional QT prolonging medications   Anemia of chronic disease Hemoglobin >10 at baseline. Continue to monitor   Hypertension Blood pressures initially elevated up to 158/92. Resume Coreg   Bipolar disorder- Once more awake will start her home meds.     Out of  bed to chair. Incentive spirometry. Nursing supportive care. Fall, aspiration precautions. Diet:  Diet Orders (From admission, onward)     Start     Ordered   12/29/23 1317  Diet NPO time specified  (Hyperglycemic Hyperosmolar State (HHS))  Diet effective now        12/29/23 1317           DVT prophylaxis: heparin  injection 5,000 Units Start: 12/29/23 0800  Level of care: ICU   Code Status: Full Code  Subjective: Patient is seen and examined today morning. She is sleepy and lethargic, opens eyes but unable to give history. Remains NPO on insulin  gtt.   Physical Exam: Vitals:   12/30/23 1033 12/30/23 1100 12/30/23 1116 12/30/23 1200  BP:  (!) 167/87  (!) 162/89  Pulse:  73  75  Resp:  12  19  Temp:   98 F (36.7 C)   TempSrc:   Axillary   SpO2:  97%  95%  Weight: 74.6 kg     Height:        General - Young [place] American female, no apparent distress HEENT - PERRLA, EOMI, atraumatic head, non tender sinuses. Lung - Clear, basal rales, rhonchi, no wheezes. Heart - S1, S2 heard, no murmurs, rubs, trace pedal edema. Abdomen - Soft, non tender, bowel sounds good Neuro - lethargic, unable to follow commands or do neruo exam. Skin - Warm and dry.  Data Reviewed:      Latest Ref Rng & Units 12/30/2023   11:21 AM 12/29/2023    6:20 AM 12/29/2023    5:38 AM  CBC  WBC  4.0 - 10.5 K/uL 12.5   10.0   Hemoglobin 12.0 - 15.0 g/dL 89.4  89.7    89.0  9.0   Hematocrit 36.0 - 46.0 % 32.0  30.0    32.0  30.7   Platelets 150 - 400 K/uL 295   282       Latest Ref Rng & Units 12/30/2023   11:21 AM 12/29/2023    4:28 PM 12/29/2023    2:20 PM  BMP  Glucose 70 - 99 mg/dL 837  545  396   BUN 6 - 20 mg/dL 34  35  36   Creatinine 0.44 - 1.00 mg/dL 3.64  3.56  3.69   Sodium 135 - 145 mmol/L 126  126  124   Potassium 3.5 - 5.1 mmol/L 3.9  3.5  3.5   Chloride 98 - 111 mmol/L 92  92  90   CO2 22 - 32 mmol/L 21  19  14    Calcium  8.9 - 10.3 mg/dL 7.8  7.7  7.7    CT ABDOMEN PELVIS WO  CONTRAST Result Date: 12/30/2023 CLINICAL DATA:  Bowel obstruction EXAM: CT ABDOMEN AND PELVIS WITHOUT CONTRAST TECHNIQUE: Multidetector CT imaging of the abdomen and pelvis was performed following the standard protocol without IV contrast. RADIATION DOSE REDUCTION: This exam was performed according to the departmental dose-optimization program which includes automated exposure control, adjustment of the mA and/or kV according to patient size and/or use of iterative reconstruction technique. COMPARISON:  12/22/2023 FINDINGS: Lower chest: Hypoattenuation of the cardiac blood pool in keeping with at least moderate anemia. Mild cardiomegaly. Small bilateral pleural effusions are present, stable since prior examination. The distal esophagus is patulous which may reflect changes of esophageal dysmotility or gastroesophageal reflux. Hepatobiliary: No focal liver abnormality is seen. Status post cholecystectomy. No biliary dilatation. Pancreas: Unremarkable Spleen: Unremarkable Adrenals/Urinary Tract: Adrenal glands are unremarkable. Kidneys are normal, without renal calculi, focal lesion, or hydronephrosis. Bladder is unremarkable. Stomach/Bowel: Stomach is within normal limits. Appendix appears normal. No evidence of bowel wall thickening, distention, or inflammatory changes. Moderate ascites Vascular/Lymphatic: Extensive visceral arteriosclerosis. The abdominal vasculature is otherwise unremarkable. There is shotty retroperitoneal and bilateral pelvic lymphadenopathy with the index lymph node within the left external iliac lymph node groups measuring up to 9 mm in diameter, nonspecific, possibly reactive in nature. No frankly pathologic adenopathy within the abdomen and pelvis. Reproductive: Uterus and bilateral adnexa are unremarkable. Other: Moderate diffuse subcutaneous body wall edema. No abdominal wall hernia. Musculoskeletal: No acute or significant osseous findings. IMPRESSION: 1. No acute intra-abdominal  pathology identified. No bowel obstruction 2. Moderate anemia. Mild cardiomegaly. 3. Stable small bilateral pleural effusions. Moderate ascites and moderate diffuse subcutaneous body wall edema. Together, the findings are in keeping anasarca. 4. Shotty retroperitoneal and bilateral pelvic lymphadenopathy, nonspecific, possibly reactive in nature. Electronically Signed   By: Dorethia Molt M.D.   On: 12/30/2023 04:00   DG Chest 2 View Result Date: 12/29/2023 EXAM: 2 VIEW(S) XRAY OF THE CHEST 12/29/2023 05:49:00 AM COMPARISON: 12/22/2023 CLINICAL HISTORY: SOB FINDINGS: LUNGS AND PLEURA: No focal pulmonary opacity. No pulmonary edema. No pleural effusion. No pneumothorax. HEART AND MEDIASTINUM: Cardiomegaly, stable. BONES AND SOFT TISSUES: No acute osseous abnormality. IMPRESSION: 1. No acute findings. 2. Cardiomegaly, stable. Electronically signed by: Waddell Calk MD 12/29/2023 06:01 AM EDT RP Workstation: [id number]    Family Communication: no family at bedside.  Disposition: Status is: Inpatient Remains inpatient appropriate because: severity of illness  Planned Discharge Destination: Home  Time spent: 53 minutes  Author: Concepcion Riser, MD 12/30/2023 12:54 PM Secure chat 7am to 7pm For on call review [email or web address].

## 2023-12-30 NOTE — Plan of Care (Incomplete)
 Vomited x1, tigan  given. Reglan  added. PNA vaccine given. NO BM, No UOP. HD completed.   Problem: Education: Goal: Knowledge of General Education information will improve Description: Including pain rating scale, medication(s)/side effects and non-pharmacologic comfort measures Outcome: Progressing   Problem: Health Behavior/Discharge Planning: Goal: Ability to manage health-related needs will improve Outcome: Progressing   Problem: Clinical Measurements: Goal: Ability to maintain clinical measurements within normal limits will improve Outcome: Progressing Goal: Will remain free from infection Outcome: Progressing Goal: Diagnostic test results will improve Outcome: Progressing Goal: Respiratory complications will improve Outcome: Progressing Goal: Cardiovascular complication will be avoided Outcome: Progressing   Problem: Activity: Goal: Risk for activity intolerance will decrease Outcome: Progressing   Problem: Nutrition: Goal: Adequate nutrition will be maintained Outcome: Progressing   Problem: Coping: Goal: Level of anxiety will decrease Outcome: Progressing   Problem: Elimination: Goal: Will not experience complications related to bowel motility Outcome: Progressing Goal: Will not experience complications related to urinary retention Outcome: Progressing   Problem: Pain Managment: Goal: General experience of comfort will improve and/or be controlled Outcome: Progressing   Problem: Safety: Goal: Ability to remain free from injury will improve Outcome: Progressing   Problem: Skin Integrity: Goal: Risk for impaired skin integrity will decrease Outcome: Progressing   Problem: Education: Goal: Ability to describe self-care measures that may prevent or decrease complications (Diabetes Survival Skills Education) will improve Outcome: Progressing Goal: Individualized Educational Video(s) Outcome: Progressing   Problem: Coping: Goal: Ability to adjust to  condition or change in health will improve Outcome: Progressing   Problem: Fluid Volume: Goal: Ability to maintain a balanced intake and output will improve Outcome: Progressing   Problem: Health Behavior/Discharge Planning: Goal: Ability to identify and utilize available resources and services will improve Outcome: Progressing Goal: Ability to manage health-related needs will improve Outcome: Progressing   Problem: Metabolic: Goal: Ability to maintain appropriate glucose levels will improve Outcome: Progressing   Problem: Nutritional: Goal: Maintenance of adequate nutrition will improve Outcome: Progressing Goal: Progress toward achieving an optimal weight will improve Outcome: Progressing   Problem: Skin Integrity: Goal: Risk for impaired skin integrity will decrease Outcome: Progressing   Problem: Tissue Perfusion: Goal: Adequacy of tissue perfusion will improve Outcome: Progressing   Problem: Education: Goal: Ability to describe self-care measures that may prevent or decrease complications (Diabetes Survival Skills Education) will improve Outcome: Progressing Goal: Individualized Educational Video(s) Outcome: Progressing   Problem: Cardiac: Goal: Ability to maintain an adequate cardiac output will improve Outcome: Progressing   Problem: Health Behavior/Discharge Planning: Goal: Ability to identify and utilize available resources and services will improve Outcome: Progressing Goal: Ability to manage health-related needs will improve Outcome: Progressing   Problem: Fluid Volume: Goal: Ability to achieve a balanced intake and output will improve Outcome: Progressing   Problem: Metabolic: Goal: Ability to maintain appropriate glucose levels will improve Outcome: Progressing   Problem: Nutritional: Goal: Maintenance of adequate nutrition will improve Outcome: Progressing Goal: Maintenance of adequate weight for body size and type will improve Outcome:  Progressing   Problem: Respiratory: Goal: Will regain and/or maintain adequate ventilation Outcome: Progressing   Problem: Urinary Elimination: Goal: Ability to achieve and maintain adequate renal perfusion and functioning will improve Outcome: Progressing

## 2023-12-30 NOTE — Progress Notes (Signed)
   12/30/23 1900  Vitals  Temp 98.1 F (36.7 C)  Pulse Rate 74  Resp 19  BP (!) 154/70  SpO2 97 %  O2 Device Room Air  Weight 69.6 kg  Type of Weight Post-Dialysis  Oxygen Therapy  Patient Activity (if Appropriate) In bed  Pulse Oximetry Type Continuous  Oximetry Probe Site Changed No  Post Treatment  Dialyzer Clearance Lightly streaked  Hemodialysis Intake (mL) 0 mL  Liters Processed 89.9  Fluid Removed (mL) 5000 mL  Tolerated HD Treatment Yes  AVG/AVF Arterial Site Held (minutes) 20 minutes  AVG/AVF Venous Site Held (minutes) 10 minutes   Tx completed at pt bedside---73m08 Alert and oriented.  Informed consent signed and in chart.   TX duration:4.0  Patient tolerated well.  Transported back to the room  Alert, without acute distress.  Hand-off given to patient's nurse.   Access used: RUAG Access issues: arterial access bled 10 minutes longer  Total UF removed: 5000 Medication(s) given: none   [facility]

## 2023-12-31 DIAGNOSIS — R9431 Abnormal electrocardiogram [ECG] [EKG]: Secondary | ICD-10-CM | POA: Diagnosis not present

## 2023-12-31 DIAGNOSIS — I5043 Acute on chronic combined systolic (congestive) and diastolic (congestive) heart failure: Secondary | ICD-10-CM | POA: Diagnosis not present

## 2023-12-31 DIAGNOSIS — E8770 Fluid overload, unspecified: Secondary | ICD-10-CM | POA: Diagnosis not present

## 2023-12-31 DIAGNOSIS — E1010 Type 1 diabetes mellitus with ketoacidosis without coma: Secondary | ICD-10-CM | POA: Diagnosis not present

## 2023-12-31 DIAGNOSIS — N185 Chronic kidney disease, stage 5: Secondary | ICD-10-CM

## 2023-12-31 LAB — GLUCOSE, CAPILLARY
Glucose-Capillary: 114 mg/dL — ABNORMAL HIGH (ref 70–99)
Glucose-Capillary: 144 mg/dL — ABNORMAL HIGH (ref 70–99)
Glucose-Capillary: 144 mg/dL — ABNORMAL HIGH (ref 70–99)
Glucose-Capillary: 97 mg/dL (ref 70–99)

## 2023-12-31 LAB — BASIC METABOLIC PANEL WITH GFR
Anion gap: 13 (ref 5–15)
BUN: 21 mg/dL — ABNORMAL HIGH (ref 6–20)
CO2: 25 mmol/L (ref 22–32)
Calcium: 7.7 mg/dL — ABNORMAL LOW (ref 8.9–10.3)
Chloride: 95 mmol/L — ABNORMAL LOW (ref 98–111)
Creatinine, Ser: 4.36 mg/dL — ABNORMAL HIGH (ref 0.44–1.00)
GFR, Estimated: 13 mL/min — ABNORMAL LOW (ref >60)
Glucose, Bld: 119 mg/dL — ABNORMAL HIGH (ref 70–99)
Potassium: 3.7 mmol/L (ref 3.5–5.1)
Sodium: 133 mmol/L — ABNORMAL LOW (ref 135–145)

## 2023-12-31 MED ORDER — LOPERAMIDE HCL 2 MG PO CAPS: 2.0000 mg | ORAL_CAPSULE | ORAL | Status: DC | PRN

## 2023-12-31 MED ORDER — OXYCODONE-ACETAMINOPHEN 5-325 MG PO TABS: 1.0000 | ORAL_TABLET | ORAL | 0 refills | Status: AC | PRN

## 2023-12-31 MED ORDER — ALUM & MAG HYDROXIDE-SIMETH 200-200-20 MG/5ML PO SUSP
15.0000 mL | Freq: Four times a day (QID) | ORAL | Status: DC | PRN
  Administered 2023-12-31: 15 mL via ORAL
  Filled 2023-12-31 (×2): qty 30

## 2023-12-31 MED ORDER — PANTOPRAZOLE SODIUM 40 MG PO TBEC
40.0000 mg | DELAYED_RELEASE_TABLET | Freq: Every day | ORAL | Status: DC
  Administered 2023-12-31: 40 mg via ORAL
  Filled 2023-12-31: qty 1

## 2023-12-31 NOTE — TOC Transition Note (Signed)
 Transition of Care [facility]) - Discharge Note   Patient Details  Name: Ashley Freeman MRN: [id number] Date of Birth: 20-Sep-1990  Transition of Care [facility] CM/SW Contact:  Lauraine FORBES Saa, LCSWA Phone Number: 12/31/2023, 11:29 AM   Clinical Narrative:     Patient will DC to: Home Anticipated DC date: 12/31/2023 Family notified: [place]; Mother; [phone] Transport by: [place]   Per MD patient ready for DC home. RN, patient, and patient's family notified of DC. CSW will sign off for now as social work intervention is no longer needed. Please consult us  again if new needs arise.    Final next level of care: Home/Self Care Barriers to Discharge: Barriers Resolved   Patient Goals and CMS Choice Patient states their goals for this hospitalization and ongoing recovery are:: to go home          Discharge Placement                  Name of family member notified: [place]utherford Berber; Mother; [phone] Patient and family notified of of transfer: 12/31/23  Discharge Plan and Services Additional resources added to the After Visit Summary for                                       Social Drivers of Health (SDOH) Interventions SDOH Screenings   Food Insecurity: No Food Insecurity (12/29/2023)  Housing: Unknown (12/29/2023)  Transportation Needs: No Transportation Needs (12/29/2023)  Recent Concern: Transportation Needs - Unmet Transportation Needs (12/22/2023)  Utilities: Not At Risk (12/29/2023)  Depression (PHQ2-9): Medium Risk (03/02/2020)  Financial Resource Strain: Medium Risk (11/15/2023)   Received from [facility]  Physical Activity: Insufficiently Active (03/02/2022)   Received from [facility]  Social Connections: Moderately Integrated (12/22/2023)  Stress: No Stress Concern Present (03/08/2023)   Received from [facility]  Tobacco Use: Low Risk  (12/29/2023)     Readmission Risk Interventions    11/28/2023   11:49 AM  10/29/2023   10:23 AM 07/23/2023    4:25 PM  Readmission Risk Prevention Plan  Transportation Screening Complete Complete Complete  Medication Review (RN Care Manager) Complete Complete Referral to Pharmacy  PCP or Specialist appointment within 3-5 days of discharge Complete Complete Complete  HRI or Home Care Consult Complete Complete Complete  SW Recovery Care/Counseling Consult  Complete Complete  Palliative Care Screening Not Applicable Not Applicable Not Applicable  Skilled Nursing Facility Not Applicable Not Applicable Not Applicable

## 2023-12-31 NOTE — Discharge Summary (Signed)
 Physician Discharge Summary   Patient: Ashley Freeman MRN: [id number] DOB: 20-Jul-1990  Admit date:     12/29/2023  Discharge date: {dischdate:26783}  Discharge Physician: Ashley Freeman   PCP: Ashley Freeman   Recommendations at discharge:  {Tip this will not be part of the note when signed- Example include specific recommendations for outpatient follow-up, pending tests to follow-up on. (Optional):26781}  ***  Discharge Diagnoses: Active Problems:   DKA, type 1 (HCC)   ESRD on hemodialysis (HCC)   Acute on chronic combined systolic and diastolic CHF (congestive heart failure) (HCC)   Hypervolemia associated with renal insufficiency   Pseudohyponatremia   Prolonged QT interval   Anemia due to stage 5 chronic kidney disease, not on chronic dialysis [facility])   Essential hypertension   Bipolar disorder (HCC)  Resolved Problems:   * No resolved hospital problems. [facility] Course: No notes on file  Assessment and Plan: No notes have been filed under this hospital service. Service: Hospitalist     {Tip this will not be part of the note when signed Body mass index is 27.18 kg/m. , ,  (Optional):26781}  {(NOTE) Pain control PDMP Statment (Optional):26782} Consultants: *** Procedures performed: ***  Disposition: {Plan; Disposition:26390} Diet recommendation:  Discharge Diet Orders (From admission, onward)     Start     Ordered   12/31/23 0000  Diet - low sodium heart healthy        12/31/23 1045   12/31/23 0000  Diet Carb Modified        12/31/23 1045           {Diet_Plan:26776} DISCHARGE MEDICATION: Allergies as of 12/31/2023       Reactions   Keflex  [cephalexin ] Anaphylaxis   Ceftriaxone  in the past with no reaction   Penicillins Anaphylaxis, Hives, Rash   Vibramycin  [doxycycline ] Anaphylaxis   Benadryl  [diphenhydramine ] Itching   Dilaudid  [hydromorphone ] Itching   Methotrexate Derivatives Rash   Roxicodone  [oxycodone ] Itching         Medication List     STOP taking these medications    losartan  100 MG tablet Commonly known as: COZAAR        TAKE these medications    albuterol  108 (90 Base) MCG/ACT inhaler Commonly known as: VENTOLIN  HFA Inhale 2 puffs into the lungs every 4 (four) hours as needed for wheezing or shortness of breath.   atorvastatin  80 MG tablet Commonly known as: LIPITOR  Take 1 tablet (80 mg total) by mouth daily.   bumetanide  2 MG tablet Commonly known as: BUMEX  Take 10 mg by mouth daily.   calcitRIOL  0.25 MCG capsule Commonly known as: ROCALTROL  Take 5 capsules (1.25 mcg total) by mouth every Tuesday, Thursday, and Saturday at 6 PM. What changed: when to take this   carvedilol  25 MG tablet Commonly known as: COREG  Take 25 mg by mouth 2 (two) times daily with a meal.   DULoxetine  20 MG capsule Commonly known as: CYMBALTA  Take 40 mg by mouth daily.   Entresto 24-26 MG Generic drug: sacubitril-valsartan Take 1 tablet by mouth 2 (two) times daily.   famotidine  20 MG tablet Commonly known as: PEPCID  Take 20 mg by mouth daily.   fluticasone  50 MCG/ACT nasal spray Commonly known as: FLONASE  Place 2 sprays into both nostrils daily.   hydrOXYzine  25 MG tablet Commonly known as: ATARAX  Take 25 mg by mouth 3 (three) times daily as needed for anxiety, itching, nausea or vomiting.   insulin  aspart 100 UNIT/ML injection Commonly known  as: novoLOG  Inject 5 Units into the skin 3 (three) times daily with meals.   lamoTRIgine  200 MG tablet Commonly known as: LAMICTAL  Take 1 tablet (200 mg total) by mouth daily.   Lantus  SoloStar 100 UNIT/ML Solostar Pen Generic drug: insulin  glargine Inject 10 Units into the skin daily.   OLANZapine  zydis 5 MG disintegrating tablet Commonly known as: ZYPREXA  Take 5 mg by mouth at bedtime.   omeprazole  40 MG capsule Commonly known as: PRILOSEC Take 40 mg by mouth daily.   ondansetron  4 MG disintegrating tablet Commonly known as:  ZOFRAN -ODT Take 1 tablet (4 mg total) by mouth every 8 (eight) hours as needed for nausea or vomiting.   oxyCODONE -acetaminophen  5-325 MG tablet Commonly known as: Percocet Take 1 tablet by mouth every 4 (four) hours as needed for up to 5 days for severe pain (pain score 7-10).   Pancrelipase  (Lip-Prot-Amyl) 3000-9500 units Cpep Take 3,000 units of lipase by mouth 3 (three) times daily before meals.   prochlorperazine  5 MG tablet Commonly known as: COMPAZINE  Take 5 mg by mouth every 6 (six) hours as needed for nausea or vomiting.   sodium bicarbonate  650 MG tablet Take 650 mg by mouth 2 (two) times daily.   SUMAtriptan  50 MG tablet Commonly known as: IMITREX  Take 50 mg by mouth every 2 (two) hours as needed for migraine or headache.        Follow-up Information     Ashley Freeman.   Specialty: Family Medicine Contact information: [address] [place] [address] [phone]                Discharge Exam: Ashley Freeman   12/30/23 1033 12/30/23 1330 12/30/23 1900  Weight: 74.6 kg 74.6 kg 69.6 kg   ***  Condition at discharge: {DC Condition:26389}  The results of significant diagnostics from this hospitalization (including imaging, microbiology, ancillary and laboratory) are listed below for reference.   Imaging Studies: CT ABDOMEN PELVIS WO CONTRAST Result Date: 12/30/2023 CLINICAL DATA:  Bowel obstruction EXAM: CT ABDOMEN AND PELVIS WITHOUT CONTRAST TECHNIQUE: Multidetector CT imaging of the abdomen and pelvis was performed following the standard protocol without IV contrast. RADIATION DOSE REDUCTION: This exam was performed according to the departmental dose-optimization program which includes automated exposure control, adjustment of the mA and/or kV according to patient size and/or use of iterative reconstruction technique. COMPARISON:  12/22/2023 FINDINGS: Lower chest: Hypoattenuation of the cardiac blood pool in keeping with at least moderate anemia.  Mild cardiomegaly. Small bilateral pleural effusions are present, stable since prior examination. The distal esophagus is patulous which may reflect changes of esophageal dysmotility or gastroesophageal reflux. Hepatobiliary: No focal liver abnormality is seen. Status post cholecystectomy. No biliary dilatation. Pancreas: Unremarkable Spleen: Unremarkable Adrenals/Urinary Tract: Adrenal glands are unremarkable. Kidneys are normal, without renal calculi, focal lesion, or hydronephrosis. Bladder is unremarkable. Stomach/Bowel: Stomach is within normal limits. Appendix appears normal. No evidence of bowel wall thickening, distention, or inflammatory changes. Moderate ascites Vascular/Lymphatic: Extensive visceral arteriosclerosis. The abdominal vasculature is otherwise unremarkable. There is shotty retroperitoneal and bilateral pelvic lymphadenopathy with the index lymph node within the left external iliac lymph node groups measuring up to 9 mm in diameter, nonspecific, possibly reactive in nature. No frankly pathologic adenopathy within the abdomen and pelvis. Reproductive: Uterus and bilateral adnexa are unremarkable. Other: Moderate diffuse subcutaneous body wall edema. No abdominal wall hernia. Musculoskeletal: No acute or significant osseous findings. IMPRESSION: 1. No acute intra-abdominal pathology identified. No bowel obstruction 2. Moderate  anemia. Mild cardiomegaly. 3. Stable small bilateral pleural effusions. Moderate ascites and moderate diffuse subcutaneous body wall edema. Together, the findings are in keeping anasarca. 4. Shotty retroperitoneal and bilateral pelvic lymphadenopathy, nonspecific, possibly reactive in nature. Electronically Signed   By: Dorethia Molt M.D.   On: 12/30/2023 04:00   DG Chest 2 View Result Date: 12/29/2023 EXAM: 2 VIEW(S) XRAY OF THE CHEST 12/29/2023 05:49:00 AM COMPARISON: 12/22/2023 CLINICAL HISTORY: SOB FINDINGS: LUNGS AND PLEURA: No focal pulmonary opacity. No pulmonary  edema. No pleural effusion. No pneumothorax. HEART AND MEDIASTINUM: Cardiomegaly, stable. BONES AND SOFT TISSUES: No acute osseous abnormality. IMPRESSION: 1. No acute findings. 2. Cardiomegaly, stable. Electronically signed by: Waddell Calk Freeman 12/29/2023 06:01 AM EDT RP Workstation: [id number]   IR Paracentesis Result Date: 12/24/2023 INDICATION: 33 year old female with end-stage renal disease and recurrent ascites for diagnostic and therapeutic paracentesis. EXAM: ULTRASOUND GUIDED RIGHT PARACENTESIS MEDICATIONS: 1% lidocaine  10 mL COMPLICATIONS: None immediate. PROCEDURE: Informed written consent was obtained from the patient after a discussion of the risks, benefits and alternatives to treatment. A timeout was performed prior to the initiation of the procedure. Initial ultrasound scanning demonstrates a large amount of ascites within the right lower abdominal quadrant. The right lower abdomen was prepped and draped in the usual sterile fashion. 1% lidocaine  was used for local anesthesia. Following this, a 19 gauge, 7-cm, Yueh catheter was introduced. An ultrasound image was saved for documentation purposes. The paracentesis was performed. The catheter was removed and a dressing was applied. The patient tolerated the procedure well without immediate post procedural complication. FINDINGS: A total of approximately 5 L of clear, yellow fluid was removed. Samples were sent to the laboratory as requested by the clinical team. IMPRESSION: Successful ultrasound-guided paracentesis yielding 5 liters of peritoneal fluid. PLAN: Performed By Lavanda Jurist, PA-C Electronically Signed   By: CHRISTELLA.  Shick M.D.   On: 12/24/2023 10:04   CT ABDOMEN PELVIS WO CONTRAST Result Date: 12/22/2023 CLINICAL DATA:  Abdominal pain EXAM: CT ABDOMEN AND PELVIS WITHOUT CONTRAST TECHNIQUE: Multidetector CT imaging of the abdomen and pelvis was performed following the standard protocol without IV contrast. RADIATION DOSE REDUCTION: This  exam was performed according to the departmental dose-optimization program which includes automated exposure control, adjustment of the mA and/or kV according to patient size and/or use of iterative reconstruction technique. COMPARISON:  CT abdomen pelvis dated 11/06/2023. FINDINGS: Evaluation of this exam is limited in the absence of intravenous contrast. Lower chest: Partially visualized small right pleural effusion with partial compressive atelectasis of the right lung base. There is mild cardiomegaly. There is hypoattenuation of the cardiac blood pool suggestive of anemia. Clinical correlation is recommended. No intra-abdominal free air.  Moderate ascites. Hepatobiliary: Mildly enlarged liver measuring 17 cm in midclavicular length. No biliary dilatation. Cholecystectomy. Pancreas: The pancreas is mildly atrophic for age. No dilatation of the main pancreatic duct Spleen: Normal in size without focal abnormality. Adrenals/Urinary Tract: The adrenal glands are unremarkable. Mild bilateral renal parenchyma atrophy. There is no hydronephrosis or nephrolithiasis on either side. The visualized ureters and urinary bladder appear unremarkable. Stomach/Bowel: There is no bowel obstruction. The appendix is normal. Vascular/Lymphatic: The abdominal aorta and IVC are unremarkable. Atherosclerotic calcification of the mesenteric arteries. No portal venous gas. No obvious adenopathy. Reproductive: The uterus is grossly unremarkable. No suspicious adnexal masses. Other: Diffuse subcutaneous edema and anasarca. Musculoskeletal: No acute or significant osseous findings. IMPRESSION: 1. Moderate ascites and anasarca, progressed since the prior CT. 2. No bowel obstruction. Normal appendix. 3. Partially  visualized small right pleural effusion with partial compressive atelectasis of the right lung base. 4. Mild cardiomegaly. Electronically Signed   By: Vanetta Chou M.D.   On: 12/22/2023 12:42   DG Chest Portable 1 View Result  Date: 12/22/2023 CLINICAL DATA:  Shortness of breath.  Hyperglycemia. EXAM: PORTABLE CHEST 1 VIEW COMPARISON:  12/19/2023 FINDINGS: Heart size is mildly enlarged, likely exaggerated by low lung volumes. Mild linear opacity in the right lung base is new and consistent with mild subsegmental atelectasis. No No evidence of focal consolidation or pleural effusion. IMPRESSION: Low lung volumes with mild right basilar subsegmental atelectasis. Electronically Signed   By: Norleen DELENA Kil M.D.   On: 12/22/2023 11:12   DG Chest Portable 1 View Result Date: 12/19/2023 CLINICAL DATA:  Shortness of breath. EXAM: PORTABLE CHEST 1 VIEW COMPARISON:  Multiple priors, most recently 12/13/2023 FINDINGS: Chronic cardiomegaly. Mild pulmonary edema. Suspect trace pleural effusions. No confluent airspace disease. No pneumothorax. No acute osseous findings. IMPRESSION: Chronic cardiomegaly. Mild pulmonary edema and trace pleural effusions. Electronically Signed   By: Andrea Gasman M.D.   On: 12/19/2023 21:27   DG Chest 2 View Result Date: 12/13/2023 CLINICAL DATA:  Shortness of breath, missed dialysis. EXAM: CHEST - 2 VIEW COMPARISON:  None Available. FINDINGS: The cardiac silhouette is mildly enlarged and unchanged in size. Low lung volumes are noted with subsequent crowding of the bronchovascular lung markings. No acute infiltrate, pleural effusion or pneumothorax is identified. A radiopaque vascular stent is seen within the medial aspect of the proximal right upper extremity. The visualized skeletal structures are unremarkable. IMPRESSION: Low lung volumes without acute or active cardiopulmonary disease. Electronically Signed   By: Suzen Dials M.D.   On: 12/13/2023 21:26    Microbiology: Results for orders placed or performed during the hospital encounter of 12/29/23  MRSA Next Gen by PCR, Nasal     Status: None   Collection Time: 12/29/23  4:29 PM   Specimen: Nasal Mucosa; Nasal Swab  Result Value Ref Range Status    MRSA by PCR Next Gen NOT DETECTED NOT DETECTED Final    Comment: (NOTE) The GeneXpert MRSA Assay (FDA approved for NASAL specimens only), is one component of a comprehensive MRSA colonization surveillance program. It is not intended to diagnose MRSA infection nor to guide or monitor treatment for MRSA infections. Test performance is not FDA approved in patients less than 75 years old. Performed at [facility], [address] [place], [place] [address]     Labs: CBC: Recent Labs  Lab 12/29/23 [phone] 12/29/23 0620 12/30/23 1121  WBC 10.0  --  12.5*  HGB 9.0* 10.9*  10.2* 10.5*  HCT 30.7* 32.0*  30.0* 32.0*  MCV 94.2  --  85.1  PLT 282  --  295   Basic Metabolic Panel: Recent Labs  Lab 12/29/23 0947 12/29/23 1420 12/29/23 1628 12/30/23 1121 12/31/23 0255  NA 120* 124* 126* 126* 133*  K 3.6 3.5 3.5 3.9 3.7  CL 86* 90* 92* 92* 95*  CO2 14* 14* 19* 21* 25  GLUCOSE 1,114* 603* 454* 162* 119*  BUN 33* 36* 35* 34* 21*  CREATININE 6.40* 6.30* 6.43* 6.35* 4.36*  CALCIUM  7.5* 7.7* 7.7* 7.8* 7.7*  PHOS  --   --   --  4.6  --    Liver Function Tests: Recent Labs  Lab 12/30/23 1121  ALBUMIN  2.4*   CBG: Recent Labs  Lab 12/30/23 2341 12/31/23 0012 12/31/23 0256 12/31/23 0315 12/31/23 9260  GLUCAP 65* 144* 114* 144* 97    Discharge time spent: {LESS THAN/GREATER THAN:26388} 30 minutes.  Signed: Concepcion Riser, [facility] 12/31/2023

## 2023-12-31 NOTE — TOC Initial Note (Signed)
 Transition of Care [facility]) - Initial/Assessment Note    Patient Details  Name: Ashley Freeman MRN: [id number] Date of Birth: 08/13/1990  [facility]) CM/SW Contact:    Lauraine FORBES Saa, LCSWA Phone Number: 12/31/2023, 11:25 AM  Clinical Narrative:                  11:25 AM CSW introduced self and role to patient. Patient confirmed she resides at home with mother and minor child (daughter). Patient confirmed she has a rolling walker at home (per chart review, through [employer]). Patient denied SNF and HH history (per chart review, has HH history with [facility]). Per chart review, patient has a PCP and insurance. Patient's preferred pharmacy's are [facility] and [facility] [phone]. No TOC needs were identified at this time.   Expected Discharge Plan: Home/Self Care Barriers to Discharge: Barriers Resolved   Patient Goals and CMS Choice Patient states their goals for this hospitalization and ongoing recovery are:: to go home          Expected Discharge Plan and Services       Living arrangements for the past 2 months: Apartment Expected Discharge Date: 12/31/23                                    Prior Living Arrangements/Services Living arrangements for the past 2 months: Apartment Lives with:: Minor Children, Parents Patient language and need for interpreter reviewed:: Yes Do you feel safe going back to the place where you live?: Yes      Need for Family Participation in Patient Care: No (Comment)   Current home services: DME Criminal Activity/Legal Involvement Pertinent to Current Situation/Hospitalization: No - Comment as needed  Activities of Daily Living   ADL Screening (condition at time of admission) Independently performs ADLs?: Yes (appropriate for developmental age) Is the patient deaf or have difficulty hearing?: No Does the patient have difficulty seeing, even when wearing glasses/contacts?: No Does the  patient have difficulty concentrating, remembering, or making decisions?: No  Permission Sought/Granted Permission sought to share information with : Family Supports Permission granted to share information with : No (Contact information on chart)  Share Information with NAME: Rutherford Berber     Permission granted to share info w Relationship: Mother  Permission granted to share info w Contact Information: [phone]  Emotional Assessment Appearance:: Appears stated age Attitude/Demeanor/Rapport: Engaged Affect (typically observed): Accepting, Adaptable, Pleasant, Appropriate, Calm, Stable Orientation: : Oriented to Self, Oriented to Place, Oriented to Situation, Oriented to  Time Alcohol / Substance Use: Not Applicable Psych Involvement: No (comment)  Admission diagnosis:  DKA, type 1 (HCC) [E10.10] Diabetic ketoacidosis without coma associated with type 1 diabetes mellitus (HCC) [E10.10] Patient Active Problem List   Diagnosis Date Noted   Hypervolemia associated with renal insufficiency 12/19/2023   Pain and swelling of right upper extremity 12/19/2023   History of seizure disorder 11/17/2023   Hyperosmolar hyperglycemic state (HHS) (HCC) 11/03/2023   Volume overload 11/03/2023   Vulvar pain 11/03/2023   Generalized abdominal pain 08/27/2023   Elevated LFTs 07/20/2023   Cholecystitis 06/30/2023   Prolonged QT interval 06/30/2023   Bipolar disorder (HCC) 06/30/2023   Hyperglycemic crisis due to Type 1 diabetes mellitus (HCC) 05/20/2023   Acute on chronic HFrEF (heart failure with reduced ejection fraction) (HCC) 05/20/2023   Acute on chronic combined systolic and diastolic CHF (congestive heart failure) (HCC) 05/20/2023  Generalized pain 05/20/2023   Hypoglycemia 09/08/2022   Dilated cardiomyopathy (HCC) 09/08/2022   Seizures (HCC) 09/08/2022   ESRD on hemodialysis (HCC) 09/08/2022   Altered mental status 09/07/2022   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) 05/26/2021   Dyslipidemia 05/26/2021   Anasarca 04/16/2021   Mild protein malnutrition (HCC) 03/07/2021   DKA, type 1 (HCC) 12/08/2020   Type 1 diabetes mellitus (HCC) 02/08/2020   Leg edema, left 09/27/2019   Systolic ejection murmur 08/03/2019   Type 1 diabetes mellitus with hyperglycemia (HCC) 07/24/2019   Diabetic retinopathy (HCC) 07/02/2019   DM (diabetes mellitus), type 1 with renal complications (HCC) 05/21/2019   Diarrhea 05/31/2016   Diabetic neuropathy (HCC) 01/16/2016   Preop cardiovascular exam 08/24/2013   Pseudohyponatremia 08/04/2012   Hypokalemia 05/07/2012   Lactic acidosis 02/19/2012   Leukocytosis 02/19/2012   Normocytic anemia 02/19/2012   Hyperkalemia 02/19/2012   Hyperglycemia 11/06/2011   Stable proliferative diabetic retinopathy associated with type 1 diabetes mellitus (HCC) 10/13/1997   PCP:  Keven Crumbly Pap, MD Pharmacy:   [facility] [place] - [address] [place] [address] Phone: [phone] Fax: [phone]  [facility] [address] [place] [address] Phone: [phone] Fax: [phone]     Social Drivers of Health (SDOH) Social History: SDOH Screenings   Food Insecurity: No Food Insecurity (12/29/2023)  Housing: Unknown (12/29/2023)  Transportation Needs: No  Transportation Needs (12/29/2023)  Recent Concern: Transportation Needs - Unmet Transportation Needs (12/22/2023)  Utilities: Not At Risk (12/29/2023)  Depression (PHQ2-9): Medium Risk (03/02/2020)  Financial Resource Strain: Medium Risk (11/15/2023)   Received from [facility]  Physical Activity: Insufficiently Active (03/02/2022)   Received from [facility]  Social Connections: Moderately Integrated (12/22/2023)  Stress: No Stress Concern Present (03/08/2023)   Received from [facility]  Tobacco Use: Low Risk  (12/29/2023)   SDOH Interventions:     Readmission Risk Interventions    11/28/2023   11:49 AM 10/29/2023   10:23 AM 07/23/2023    4:25 PM  Readmission Risk Prevention Plan  Transportation Screening Complete Complete Complete  Medication Review (RN Care Manager) Complete Complete Referral to Pharmacy  PCP or Specialist appointment within 3-5 days of discharge Complete Complete Complete  HRI or Home Care Consult Complete Complete Complete  SW Recovery Care/Counseling Consult  Complete Complete  Palliative Care Screening Not Applicable Not Applicable Not Applicable  Skilled Nursing Facility Not Applicable Not Applicable Not Applicable

## 2024-01-04 ENCOUNTER — Encounter (HOSPITAL_COMMUNITY): Payer: Self-pay

## 2024-01-04 ENCOUNTER — Emergency Department (HOSPITAL_COMMUNITY)

## 2024-01-04 ENCOUNTER — Emergency Department (HOSPITAL_COMMUNITY)
Admission: EM | Admit: 2024-01-04 | Discharge: 2024-01-05 | Disposition: A | Discharge status: Home / Self Care | Attending: Emergency Medicine | Admitting: Emergency Medicine

## 2024-01-04 ENCOUNTER — Other Ambulatory Visit: Payer: Self-pay

## 2024-01-04 DIAGNOSIS — E1022 Type 1 diabetes mellitus with diabetic chronic kidney disease: Secondary | ICD-10-CM | POA: Diagnosis not present

## 2024-01-04 DIAGNOSIS — R188 Other ascites: Secondary | ICD-10-CM | POA: Insufficient documentation

## 2024-01-04 DIAGNOSIS — R0602 Shortness of breath: Secondary | ICD-10-CM | POA: Diagnosis present

## 2024-01-04 DIAGNOSIS — I509 Heart failure, unspecified: Secondary | ICD-10-CM | POA: Diagnosis not present

## 2024-01-04 DIAGNOSIS — R1084 Generalized abdominal pain: Secondary | ICD-10-CM | POA: Insufficient documentation

## 2024-01-04 DIAGNOSIS — J811 Chronic pulmonary edema: Secondary | ICD-10-CM | POA: Insufficient documentation

## 2024-01-04 DIAGNOSIS — Z794 Long term (current) use of insulin: Secondary | ICD-10-CM | POA: Insufficient documentation

## 2024-01-04 DIAGNOSIS — N186 End stage renal disease: Secondary | ICD-10-CM | POA: Insufficient documentation

## 2024-01-04 DIAGNOSIS — Z7984 Long term (current) use of oral hypoglycemic drugs: Secondary | ICD-10-CM | POA: Insufficient documentation

## 2024-01-04 DIAGNOSIS — R519 Headache, unspecified: Secondary | ICD-10-CM | POA: Diagnosis not present

## 2024-01-04 DIAGNOSIS — E1042 Type 1 diabetes mellitus with diabetic polyneuropathy: Secondary | ICD-10-CM | POA: Insufficient documentation

## 2024-01-04 DIAGNOSIS — Z992 Dependence on renal dialysis: Secondary | ICD-10-CM | POA: Insufficient documentation

## 2024-01-04 DIAGNOSIS — R0600 Dyspnea, unspecified: Secondary | ICD-10-CM

## 2024-01-04 LAB — COMPREHENSIVE METABOLIC PANEL WITH GFR
ALT: 51 U/L — ABNORMAL HIGH (ref 0–44)
AST: 83 U/L — ABNORMAL HIGH (ref 15–41)
Albumin: 2.8 g/dL — ABNORMAL LOW (ref 3.5–5.0)
Alkaline Phosphatase: 558 U/L — ABNORMAL HIGH (ref 38–126)
Anion gap: 16 — ABNORMAL HIGH (ref 5–15)
BUN: 20 mg/dL (ref 6–20)
CO2: 21 mmol/L — ABNORMAL LOW (ref 22–32)
Calcium: 7.7 mg/dL — ABNORMAL LOW (ref 8.9–10.3)
Chloride: 101 mmol/L (ref 98–111)
Creatinine, Ser: 5.43 mg/dL — ABNORMAL HIGH (ref 0.44–1.00)
GFR, Estimated: 10 mL/min — ABNORMAL LOW (ref >60)
Glucose, Bld: 180 mg/dL — ABNORMAL HIGH (ref 70–99)
Potassium: 3.7 mmol/L (ref 3.5–5.1)
Sodium: 138 mmol/L (ref 135–145)
Total Bilirubin: 0.9 mg/dL (ref 0.0–1.2)
Total Protein: 5.3 g/dL — ABNORMAL LOW (ref 6.5–8.1)

## 2024-01-04 LAB — BODY FLUID CELL COUNT WITH DIFFERENTIAL
Eos, Fluid: 3 %
Lymphs, Fluid: 72 %
Monocyte-Macrophage-Serous Fluid: 6 % — ABNORMAL LOW (ref 50–90)
Neutrophil Count, Fluid: 18 % (ref 0–25)
Other Cells, Fluid: 1 %
Total Nucleated Cell Count, Fluid: 261 uL (ref 0–1000)

## 2024-01-04 LAB — CBC WITH DIFFERENTIAL/PLATELET
Abs Immature Granulocytes: 0.06 K/uL (ref 0.00–0.07)
Basophils Absolute: 0.1 K/uL (ref 0.0–0.1)
Basophils Relative: 0 %
Eosinophils Absolute: 0.9 K/uL — ABNORMAL HIGH (ref 0.0–0.5)
Eosinophils Relative: 7 %
HCT: 29.5 % — ABNORMAL LOW (ref 36.0–46.0)
Hemoglobin: 8.9 g/dL — ABNORMAL LOW (ref 12.0–15.0)
Immature Granulocytes: 1 %
Lymphocytes Relative: 21 %
Lymphs Abs: 2.7 K/uL (ref 0.7–4.0)
MCH: 27.6 pg (ref 26.0–34.0)
MCHC: 30.2 g/dL (ref 30.0–36.0)
MCV: 91.3 fL (ref 80.0–100.0)
Monocytes Absolute: 1 K/uL (ref 0.1–1.0)
Monocytes Relative: 8 %
Neutro Abs: 8 K/uL — ABNORMAL HIGH (ref 1.7–7.7)
Neutrophils Relative %: 63 %
Platelets: 264 K/uL (ref 150–400)
RBC: 3.23 MIL/uL — ABNORMAL LOW (ref 3.87–5.11)
RDW: 16.2 % — ABNORMAL HIGH (ref 11.5–15.5)
WBC: 12.8 K/uL — ABNORMAL HIGH (ref 4.0–10.5)
nRBC: 0 % (ref 0.0–0.2)

## 2024-01-04 LAB — I-STAT VENOUS BLOOD GAS, ED
Acid-Base Excess: 1 mmol/L (ref 0.0–2.0)
Bicarbonate: 25.7 mmol/L (ref 20.0–28.0)
Calcium, Ion: 0.93 mmol/L — ABNORMAL LOW (ref 1.15–1.40)
HCT: 26 % — ABNORMAL LOW (ref 36.0–46.0)
Hemoglobin: 8.8 g/dL — ABNORMAL LOW (ref 12.0–15.0)
O2 Saturation: 99 %
Potassium: 5.5 mmol/L — ABNORMAL HIGH (ref 3.5–5.1)
Sodium: 133 mmol/L — ABNORMAL LOW (ref 135–145)
TCO2: 27 mmol/L (ref 22–32)
pCO2, Ven: 39.6 mmHg — ABNORMAL LOW (ref 44–60)
pH, Ven: 7.421 (ref 7.25–7.43)
pO2, Ven: 153 mmHg — ABNORMAL HIGH (ref 32–45)

## 2024-01-04 LAB — GLUCOSE, PLEURAL OR PERITONEAL FLUID: Glucose, Fluid: 160 mg/dL

## 2024-01-04 LAB — GRAM STAIN

## 2024-01-04 LAB — HCG, SERUM, QUALITATIVE: Preg, Serum: NEGATIVE

## 2024-01-04 LAB — BRAIN NATRIURETIC PEPTIDE: B Natriuretic Peptide: >4500 pg/mL — ABNORMAL HIGH (ref 0.0–100.0)

## 2024-01-04 LAB — BETA-HYDROXYBUTYRIC ACID: Beta-Hydroxybutyric Acid: 0.08 mmol/L (ref 0.05–0.27)

## 2024-01-04 LAB — LIPASE, BLOOD: Lipase: 22 U/L (ref 11–51)

## 2024-01-04 MED ORDER — CHLORHEXIDINE GLUCONATE CLOTH 2 % EX PADS: 6.0000 | MEDICATED_PAD | Freq: Every day | CUTANEOUS | Status: DC

## 2024-01-04 MED ORDER — HYDROMORPHONE HCL 1 MG/ML IJ SOLN
INTRAMUSCULAR | Status: AC
  Filled 2024-01-04: qty 0.5

## 2024-01-04 MED ORDER — ALTEPLASE 2 MG IJ SOLR: 2.0000 mg | Freq: Once | INTRAMUSCULAR | Status: DC | PRN

## 2024-01-04 MED ORDER — HEPARIN SODIUM (PORCINE) 1000 UNIT/ML DIALYSIS: 2000.0000 [IU] | INTRAMUSCULAR | Status: DC | PRN

## 2024-01-04 MED ORDER — FENTANYL CITRATE PF 50 MCG/ML IJ SOSY
50.0000 ug | PREFILLED_SYRINGE | Freq: Once | INTRAMUSCULAR | Status: AC
  Administered 2024-01-04: 50 ug via INTRAVENOUS
  Filled 2024-01-04: qty 1

## 2024-01-04 MED ORDER — PENTAFLUOROPROP-TETRAFLUOROETH EX AERO: 1.0000 | INHALATION_SPRAY | CUTANEOUS | Status: DC | PRN

## 2024-01-04 MED ORDER — NEPRO/CARBSTEADY PO LIQD: 237.0000 mL | ORAL | Status: DC | PRN

## 2024-01-04 MED ORDER — KETOROLAC TROMETHAMINE 30 MG/ML IJ SOLN
30.0000 mg | Freq: Once | INTRAMUSCULAR | Status: AC
  Administered 2024-01-04: 30 mg via INTRAVENOUS
  Filled 2024-01-04: qty 1

## 2024-01-04 MED ORDER — MORPHINE SULFATE (PF) 4 MG/ML IV SOLN
4.0000 mg | Freq: Once | INTRAVENOUS | Status: AC
  Administered 2024-01-04: 4 mg via INTRAVENOUS
  Filled 2024-01-04: qty 1

## 2024-01-04 MED ORDER — LIDOCAINE HCL (PF) 1 % IJ SOLN: 5.0000 mL | INTRAMUSCULAR | Status: DC | PRN

## 2024-01-04 MED ORDER — ANTICOAGULANT SODIUM CITRATE 4% (200MG/5ML) IV SOLN
5.0000 mL | Status: DC | PRN
Start: 2024-01-04 — End: 2024-01-05

## 2024-01-04 MED ORDER — HYDROMORPHONE HCL 1 MG/ML IJ SOLN
0.5000 mg | INTRAMUSCULAR | Status: DC | PRN
  Administered 2024-01-04: 0.5 mg via INTRAVENOUS

## 2024-01-04 MED ORDER — HEPARIN SODIUM (PORCINE) 1000 UNIT/ML DIALYSIS: 1000.0000 [IU] | INTRAMUSCULAR | Status: DC | PRN

## 2024-01-04 MED ORDER — HEPARIN SODIUM (PORCINE) 1000 UNIT/ML DIALYSIS: 2000.0000 [IU] | Freq: Once | INTRAMUSCULAR | Status: DC

## 2024-01-04 MED ORDER — LIDOCAINE HCL (PF) 1 % IJ SOLN
8.0000 mL | Freq: Once | INTRAMUSCULAR | Status: AC
  Administered 2024-01-04: 8 mL

## 2024-01-04 MED ORDER — LIDOCAINE-PRILOCAINE 2.5-2.5 % EX CREA: 1.0000 | TOPICAL_CREAM | CUTANEOUS | Status: DC | PRN

## 2024-01-04 NOTE — ED Notes (Addendum)
 Pt asleep when entering room- pt woken up for rounding and advised she continues to have 10/10 pain. Pt in no obvious distress at this time. Pt appears to be resting comfortably.

## 2024-01-04 NOTE — Procedures (Addendum)
 Asked to see this patient for hospital dialysis. Pt presented to ED today for abd pain and SOB. She has hx of recurrent ascites requiring paracentesis. Also has ESRD, missed HD Friday. Pt is to get paracentesis by IR, we asked to dialyze the patient for vol overload (chronic condition for this pt). The plan will be for ED HD. Pt is not to be admitted at this time. Pt will go to the dialysis unit when they are ready for the patient. When dialysis is completed pt will be sent back to ED for reassessment.   Vitals:   01/04/24 0339 01/04/24 0340  BP: (!) 163/94   Pulse: (!) 108   Resp: (!) 33   Temp: 98.6 F (37 C)   TempSrc: Oral   SpO2:  100%  Weight:  63 kg  Height:  5' 3 (1.6 m)   OP HD: MWF [facility]  AVG, hep 1600 bolus +600/hr    I was present at the procedure, reviewed the HD regimen and made appropriate changes.   Myer Fret MD  [facility] 01/04/2024, 8:45 PM    Recent Labs  Lab 12/30/23 1121 12/31/23 0255 01/04/24 0424 01/04/24 0507  HGB 10.5*  --  8.9* 8.8*  ALBUMIN  2.4*  --  2.8*  --   CALCIUM  7.8* 7.7* 7.7*  --   PHOS 4.6  --   --   --   CREATININE 6.35* 4.36* 5.43*  --   K 3.9 3.7 3.7 5.5*   No results for input(s): IRON , TIBC, FERRITIN in the last 168 hours. Inpatient medications:

## 2024-01-04 NOTE — ED Provider Notes (Signed)
 Patient signed out to me by Ubaldo High PA-C at time of shift change, please see his note for further detail.  Briefly patient is a 33 year old female who presented to the emergency department with a chief complaint of abdominal pain as well as a headache.  Patient has significant past medical history of type 1 diabetes, end-stage renal disease on dialysis Monday Wednesday Friday, housing insecurity, chronic congestive heart failure, as well as potassium level abnormalities.  Patient has a history of ascites requiring multiple paracentesis in the past, patient was recently admitted to the hospital due to diabetic ketoacidosis.  Patient had additional dialysis session last Thursday however was unable to go to her normal dialysis session on Friday due to family health concerns.  Due to this patient also feels like she has developed some shortness of breath. Patient still makes urine. Abdomen noted to be distended on exam.   Prior to shift change patient given morphine  as well as toradol . Lab work-up significant for white blood cell count of 12.8, hemoglobin of 8.9 (baseline seems to be around same). Creatinine consistent with ESRD, AST/ALT/Alk phos consistent with previous labs. BNP noted to be >4500. Patient does not appear to be in DKA.   CT abdomen pelvis as well as CXR consistent with pulmonary edema as well as ascites but no other acute findings.  At this time planning on consulting interventional radiology for paracentesis as well as nephrology for dialysis today. Potassium noted to be 3.7. EKG obtained.   Spoke with Dr. Ester Sides who arranged for paracentesis via IR. Samples obtained to rule out SBP however low clinical suspicion at this time based off of history, physical exam, as well as labs and imaging. Patient returned to ED after paracentesis and is now pending dialysis which was coordinated with Dr. Geralynn with nephrology.  Plan for reassessment after dialysis for possible discharge.    Patient signed out to Dr. Dionisio who assumes care over this patient, further work-up, as well as disposition.  At this time suspect flare of chronic abdominal pain with complication of ascites due to missing dialysis treatment.      Teale Goodgame F, PA-C 01/04/24 1704    Elnor Bernarda SQUIBB, DO 01/20/24 [phone]

## 2024-01-04 NOTE — ED Triage Notes (Signed)
 Pt bib ems complaint of abd pain along with some SOB. Pt is a dialysis pt, typically M,W,F. Pt went in on Thursday instead of Friday this week. Abd pain and distention has been ongoing for approx 2 hrs

## 2024-01-04 NOTE — ED Provider Notes (Signed)
 [place] EMERGENCY DEPARTMENT AT [facility] Provider Note   CSN: [id number] Arrival date & time: 01/04/24  9663     Patient presents with: Abdominal Pain   Ashley Freeman is a 33 y.o. female.  Patient with history of hypokalemia, hypokalemia, diabetic neuropathy, type I DM, ESRD on Monday Wednesday Friday dialysis, housing insecurity, chronic CHF, presents to the emergency department via EMS complaining of abdominal pain and headache.  Abdominal pain has been chronic in nature.  Patient with history of ascites requiring paracentesis.  Patient recently admitted to the hospital due to DKA.  She did have dialysis on Thursday this week and said for normal Friday session.  She states that she was supposed to have an additional session on Friday but due to family health concerns was unable to go to the additional session on Friday.  Patient complains of shortness of breath thought to be due to inadequate dialysis.  The abdominal pain is generalized with bloating.  She denies nausea, vomiting, diarrhea.  The patient does still make a small lot of urine.  Abdominal pain has been ongoing for approximately 2 hours.  During my assessment she also complains of a mild headache.  She denies chest pain.    Abdominal Pain      Prior to Admission medications   Medication Sig Start Date End Date Taking? Authorizing Provider  albuterol  (VENTOLIN  HFA) 108 (90 Base) MCG/ACT inhaler Inhale 2 puffs into the lungs every 4 (four) hours as needed for wheezing or shortness of breath. 11/24/22   [provider]  atorvastatin  (LIPITOR ) 80 MG tablet Take 1 tablet (80 mg total) by mouth daily. Patient not taking: Reported on 12/23/2023 10/15/23   Jonel Lonni SQUIBB, MD  bumetanide  (BUMEX ) 2 MG tablet Take 10 mg by mouth daily.    [provider]  calcitRIOL  (ROCALTROL ) 0.25 MCG capsule Take 5 capsules (1.25 mcg total) by mouth every Tuesday, Thursday, and Saturday at 6  PM. Patient taking differently: Take 1.25 mcg by mouth every Monday, Wednesday, and Friday. 07/09/23   Cindy Garnette POUR, MD  carvedilol  (COREG ) 25 MG tablet Take 25 mg by mouth 2 (two) times daily with a meal.    [provider]  DULoxetine  (CYMBALTA ) 20 MG capsule Take 40 mg by mouth daily. 07/29/23   [provider]  famotidine  (PEPCID ) 20 MG tablet Take 20 mg by mouth daily. 07/29/23   [provider]  fluticasone  (FLONASE ) 50 MCG/ACT nasal spray Place 2 sprays into both nostrils daily. 12/11/23 12/10/24  [provider]  hydrOXYzine  (ATARAX ) 25 MG tablet Take 25 mg by mouth 3 (three) times daily as needed for anxiety, itching, nausea or vomiting.    [provider]  insulin  aspart (NOVOLOG ) 100 UNIT/ML injection Inject 5 Units into the skin 3 (three) times daily with meals.    [provider]  lamoTRIgine  (LAMICTAL ) 200 MG tablet Take 1 tablet (200 mg total) by mouth daily. 10/29/23   Regalado, Belkys A, MD  LANTUS  SOLOSTAR 100 UNIT/ML Solostar Pen Inject 10 Units into the skin daily.    [provider]  OLANZapine  zydis (ZYPREXA ) 5 MG disintegrating tablet Take 5 mg by mouth at bedtime. 09/23/23 02/08/24  [provider]  omeprazole  (PRILOSEC) 40 MG capsule Take 40 mg by mouth daily. 12/11/23 02/09/24  [provider]  ondansetron  (ZOFRAN -ODT) 4 MG disintegrating tablet Take 1 tablet (4 mg total) by mouth every 8 (eight) hours as needed for nausea or vomiting. 06/23/23  Davis, Jonathon H, MD  oxyCODONE -acetaminophen  (PERCOCET) 5-325 MG tablet Take 1 tablet by mouth every 4 (four) hours as needed for up to 5 days for severe pain (pain score 7-10). 12/31/23 01/05/24  Darci Pore, MD  Pancrelipase , Lip-Prot-Amyl, 3000-9500 units CPEP Take 3,000 units of lipase by mouth 3 (three) times daily before meals. 09/23/23 02/08/24  [provider]  prochlorperazine  (COMPAZINE ) 5 MG tablet Take 5 mg by mouth every 6 (six)  hours as needed for nausea or vomiting.    [provider]  sacubitril-valsartan (ENTRESTO) 24-26 MG Take 1 tablet by mouth 2 (two) times daily.    [provider]  sodium bicarbonate  650 MG tablet Take 650 mg by mouth 2 (two) times daily. 07/29/23   [provider]  SUMAtriptan  (IMITREX ) 50 MG tablet Take 50 mg by mouth every 2 (two) hours as needed for migraine or headache. 06/20/22   [provider]    Allergies: Keflex  [cephalexin ], Penicillins, Vibramycin  [doxycycline ], Benadryl  [diphenhydramine ], Dilaudid  [hydromorphone ], Methotrexate derivatives, and Roxicodone  [oxycodone ]    Review of Systems  Gastrointestinal:  Positive for abdominal pain.    Updated Vital Signs BP (!) 163/94   Pulse (!) 108   Temp 98.6 F (37 C) (Oral)   Resp (!) 33   Ht 5' 3 (1.6 m)   Wt 63 kg   LMP  (LMP Unknown) Comment: patient states she doesnt get her period  SpO2 100%   BMI 24.60 kg/m   Physical Exam Vitals and nursing note reviewed.  Constitutional:      Appearance: She is well-developed.  HENT:     Head: Normocephalic and atraumatic.  Eyes:     Extraocular Movements: Extraocular movements intact.  Cardiovascular:     Rate and Rhythm: Normal rate and regular rhythm.     Heart sounds: Normal heart sounds.  Pulmonary:     Effort: Pulmonary effort is normal.     Breath sounds: Normal breath sounds.  Abdominal:     General: There is distension.     Palpations: Abdomen is soft.     Tenderness: There is abdominal tenderness (generalized).  Skin:    General: Skin is warm and dry.  Neurological:     Mental Status: She is alert.     Comments: CN 3-12 grossly intact. 5/5 strength in all 4 extremities. Grossly normal sensation. Normal finger to nose.      (all labs ordered are listed, but only abnormal results are displayed) Labs Reviewed  CBC WITH DIFFERENTIAL/PLATELET - Abnormal; Notable for the following components:      Result Value   WBC 12.8 (*)     RBC 3.23 (*)    Hemoglobin 8.9 (*)    HCT 29.5 (*)    RDW 16.2 (*)    Neutro Abs 8.0 (*)    Eosinophils Absolute 0.9 (*)    All other components within normal limits  COMPREHENSIVE METABOLIC PANEL WITH GFR - Abnormal; Notable for the following components:   CO2 21 (*)    Glucose, Bld 180 (*)    Creatinine, Ser 5.43 (*)    Calcium  7.7 (*)    Total Protein 5.3 (*)    Albumin  2.8 (*)    AST 83 (*)    ALT 51 (*)    Alkaline Phosphatase 558 (*)    GFR, Estimated 10 (*)    Anion gap 16 (*)    All other components within normal limits  BRAIN NATRIURETIC PEPTIDE - Abnormal; Notable for the following components:  B Natriuretic Peptide >4,500.0 (*)    All other components within normal limits  I-STAT VENOUS BLOOD GAS, ED - Abnormal; Notable for the following components:   pCO2, Ven 39.6 (*)    pO2, Ven 153 (*)    Sodium 133 (*)    Potassium 5.5 (*)    Calcium , Ion 0.93 (*)    HCT 26.0 (*)    Hemoglobin 8.8 (*)    All other components within normal limits  HCG, SERUM, QUALITATIVE  LIPASE, BLOOD  BETA-HYDROXYBUTYRIC ACID    EKG: None  Radiology: CT ABDOMEN PELVIS WO CONTRAST Result Date: 01/04/2024 CLINICAL DATA:  Abdominal pain and distension. EXAM: CT ABDOMEN AND PELVIS WITHOUT CONTRAST TECHNIQUE: Multidetector CT imaging of the abdomen and pelvis was performed following the standard protocol without IV contrast. RADIATION DOSE REDUCTION: This exam was performed according to the departmental dose-optimization program which includes automated exposure control, adjustment of the mA and/or kV according to patient size and/or use of iterative reconstruction technique. COMPARISON:  12/30/2023 FINDINGS: Lower chest: Interlobular septal thickening with ground-glass opacity noted in the lung bases, findings suggestive of pulmonary edema. Trace bilateral pleural effusions, decreased in the interval. Hepatobiliary: No suspicious focal abnormality in the liver on this study without  intravenous contrast. Gallbladder is surgically absent. No intrahepatic or extrahepatic biliary dilation. Pancreas: No focal mass lesion. No dilatation of the main duct. No intraparenchymal cyst. No peripancreatic edema. Spleen: No splenomegaly. No suspicious focal mass lesion. Adrenals/Urinary Tract: No adrenal nodule or mass. Kidneys unremarkable. No evidence for hydroureter. The urinary bladder appears normal for the degree of distention. Stomach/Bowel: Stomach is unremarkable. No gastric wall thickening. No evidence of outlet obstruction. Duodenum is normally positioned as is the ligament of Treitz. No small bowel wall thickening. No small bowel dilatation. The appendix is normal. No gross colonic mass. No colonic wall thickening. Vascular/Lymphatic: No abdominal aortic aneurysm. Major mesenteric arterial anatomy is densely calcified. There is no gastrohepatic or hepatoduodenal ligament lymphadenopathy. No retroperitoneal or mesenteric lymphadenopathy. Mild groin and left pelvic sidewall lymphadenopathy is similar to prior. 10 mm short axis left pelvic sidewall lymph node identified on 75/4. index 12 mm short axis right groin node seen on 95/4. Reproductive: No adnexal mass. Other: Moderate to large volume ascites, similar to prior. Musculoskeletal: Diffuse body wall edema evident. No worrisome lytic or sclerotic osseous abnormality. IMPRESSION: 1. No acute findings in the abdomen or pelvis. Specifically, no acute findings to explain the patient's history of abdominal pain. 2. Moderate to large volume ascites, similar to prior. 3. Interlobular septal thickening with ground-glass opacity in the lung bases, findings suggestive of pulmonary edema. 4. Mild groin and left pelvic sidewall lymphadenopathy, similar to prior. Electronically Signed   By: Camellia Candle M.D.   On: 01/04/2024 05:46   DG Chest Portable 1 View Result Date: 01/04/2024 CLINICAL DATA:  Dyspnea and shortness of breath. EXAM: PORTABLE CHEST 1  VIEW COMPARISON:  12/29/2023 FINDINGS: The cardio pericardial silhouette is enlarged. Interval progression of diffuse bilateral airspace opacities suggesting edema although appearance is likely accentuated by superimposition of soft tissues. No substantial pleural effusion. No discernible pneumothorax. No acute bony abnormality. Telemetry leads overlie the chest. IMPRESSION: Interval progression of diffuse bilateral airspace opacities suggesting edema although appearance is likely accentuated by superimposition of soft tissues. Electronically Signed   By: Camellia Candle M.D.   On: 01/04/2024 05:21     Procedures   Medications Ordered in the ED  morphine  (PF) 4 MG/ML injection 4 mg (4 mg  Intravenous Given 01/04/24 0449)  ketorolac  (TORADOL ) 30 MG/ML injection 30 mg (30 mg Intravenous Given 01/04/24 9388)                                    Medical Decision Making Amount and/or Complexity of Data Reviewed Labs: ordered. Radiology: ordered.  Risk Prescription drug management.   This patient presents to the ED for concern of abdominal pain, shortness of breath, this involves an extensive number of treatment options, and is a complaint that carries with it a high risk of complications and morbidity.  The differential diagnosis includes ascites, fluid overload, need for hemodialysis, DKA, others   Co morbidities / Chronic conditions that complicate the patient evaluation  Type I DM   Additional history obtained:  Additional history obtained from EMR External records from outside source obtained and reviewed including recent discharge summary   Lab Tests:  I Ordered, and personally interpreted labs.  The pertinent results include:  No DKA, LFTs grossly at baseline   Imaging Studies ordered:  I ordered imaging studies including ct abdomen pelvis, chest x-ray  I independently visualized and interpreted imaging which showed  1. No acute findings in the abdomen or pelvis. Specifically,  no  acute findings to explain the patient's history of abdominal pain.  2. Moderate to large volume ascites, similar to prior.  3. Interlobular septal thickening with ground-glass opacity in the  lung bases, findings suggestive of pulmonary edema.  4. Mild groin and left pelvic sidewall lymphadenopathy, similar to  prior.   I agree with the radiologist interpretation   Problem List / ED Course / Critical interventions / Medication management   I ordered medication including morphine , toradol    Reevaluation of the patient after these medicines showed that the patient improved   Social Determinants of Health:  Patient has Medicaid for her primary health insurance type   Test / Admission - Considered:  Patient care signed out to [facility], PA-C at shift handoff. Anticipate hemodialysis for pulmonary edema/dyspnea, possible paracentesis      Final diagnoses:  Generalized abdominal pain  Other ascites  Dyspnea, unspecified type  Chronic pulmonary edema    ED Discharge Orders     None          Logan Ubaldo KATHEE DEVONNA 01/04/24 9361    Palumbo, April, MD 01/04/24 [phone]

## 2024-01-04 NOTE — ED Notes (Signed)
 Procedure consent for US  Paracentesis signed by patient and filed in medical records.

## 2024-01-04 NOTE — Procedures (Signed)
 PROCEDURE SUMMARY:  Successful image-guided paracentesis from the right lower abdomen.  Yielded 3.2 liters of slightly hazy yellow fluid.  No immediate complications.  EBL: trace Patient tolerated well.   Specimen was sent for labs.  Please see imaging section of Epic for full dictation.  Kimble DEL Iseah Plouff PA-C 01/04/2024 1:53 PM

## 2024-01-04 NOTE — ED Notes (Signed)
 Pt made bowel movement, stated that she felt a little better afterwards.

## 2024-01-05 NOTE — ED Provider Notes (Signed)
 Patient returned to the emergency room from dialysis.  Reportedly she was dyspneic and had pretty significant abdominal distention however patient feels all of this is now resolved.  She is eager for Uvaldo states that she feels at her baseline.  She is no longer short of breath or with any new acute complaints.   Ciena Sampley, Selinda, MD 01/05/24 [phone]

## 2024-01-05 NOTE — Progress Notes (Signed)
 Received patient in on stretcher to unit fron ED.  Alert and oriented.  Informed consent signed and in chart.   TX duration:3.5 hours  Patient tolerated well.  Transported back to the room (ED) Alert, without acute distress.  Hand-off given to patient's nurse. Emilie, ED RN Returned to ED per transport on stretcher  Access used: Right AVG Access issues: None  Total UF removed: 3L Medication(s) given: Dilaudid  0.5 mg for abdominal pain a 9 with relief Post HD VS: [id number] B/P175/96 Post HD weight: 60kg  Neville Seip, RN [facility] D[facility] Unit    01/04/24 2330  Vitals  Temp 99 F (37.2 C)  Temp Source Oral  BP (!) 175/96  BP Location Left Arm  BP Method Automatic  Patient Position (if appropriate) Lying  Pulse Rate (!) 101  Pulse Rate Source Monitor  ECG Heart Rate (!) 101  Resp 18  Weight 60 kg  Type of Weight Post-Dialysis  Oxygen Therapy  SpO2 97 %  O2 Device Room Air  Patient Activity (if Appropriate)  Tax inspector)  During Treatment Monitoring  Blood Flow Rate (mL/min) 0 mL/min  Arterial Pressure (mmHg) -40.6 mmHg  Venous Pressure (mmHg) 86.05 mmHg  TMP (mmHg) 11.51 mmHg  Ultrafiltration Rate (mL/min) 1049 mL/min  Dialysate Flow Rate (mL/min) 299 ml/min  Dialysate Potassium Concentration 2  Dialysate Calcium  Concentration 2.5  Duration of HD Treatment -hour(s) 3.5 hour(s)  Cumulative Fluid Removed (mL) per Treatment  3000.12  HD Safety Checks Performed Yes  Post Treatment  Dialyzer Clearance Clear  Liters Processed 84  Fluid Removed (mL) 3000 mL  Tolerated HD Treatment Yes  AVG/AVF Arterial Site Held (minutes) 10 minutes  AVG/AVF Venous Site Held (minutes) 10 minutes  Fistula / Graft Right Upper arm Arteriovenous vein graft  Placement Date/Time: 07/06/22 1013   Placed prior to admission: No  Orientation: Right  Access Location: Upper arm  Access Type: Arteriovenous vein graft  Site Condition No complications  Fistula / Graft Assessment  Present;Thrill;Bruit  Status Deaccessed  Needle Size 15G  Drainage Description None

## 2024-01-06 ENCOUNTER — Ambulatory Visit (INDEPENDENT_AMBULATORY_CARE_PROVIDER_SITE_OTHER): Admitting: Podiatry

## 2024-01-06 ENCOUNTER — Encounter: Payer: Self-pay | Admitting: Podiatry

## 2024-01-06 VITALS — Ht 63.0 in | Wt 135.0 lb

## 2024-01-06 DIAGNOSIS — B351 Tinea unguium: Secondary | ICD-10-CM

## 2024-01-06 DIAGNOSIS — B353 Tinea pedis: Secondary | ICD-10-CM

## 2024-01-06 DIAGNOSIS — M79675 Pain in left toe(s): Secondary | ICD-10-CM | POA: Diagnosis not present

## 2024-01-06 DIAGNOSIS — E1042 Type 1 diabetes mellitus with diabetic polyneuropathy: Secondary | ICD-10-CM | POA: Diagnosis not present

## 2024-01-06 DIAGNOSIS — M79674 Pain in right toe(s): Secondary | ICD-10-CM | POA: Diagnosis not present

## 2024-01-06 DIAGNOSIS — E1065 Type 1 diabetes mellitus with hyperglycemia: Secondary | ICD-10-CM

## 2024-01-06 LAB — PATHOLOGIST SMEAR REVIEW

## 2024-01-06 MED ORDER — KETOCONAZOLE 2 % EX CREA
1.0000 | TOPICAL_CREAM | Freq: Two times a day (BID) | CUTANEOUS | 2 refills | Status: DC
Start: 2024-01-06 — End: 2024-03-24

## 2024-01-06 NOTE — Progress Notes (Unsigned)
 Chief Complaint  Patient presents with   Foot Pain    Here for [facility]. Is having nerve pain bilaterally mainly plantar. No calluses Diabetic A1c 10.0. Dialysis 3 x week   HPI: 33 y.o. female presents today for diabetic foot check.  Patient does have chronic kidney failure and states that she is due for kidney transplant but needs to get her A1c down.  She noted that she does have a donor.  She acknowledges that she has some swelling in her legs.  She is requesting nail care today.  Last A1c on 12/20/2023 was 10.8  Past Medical History:  Diagnosis Date   Abscess, gluteal, right 08/24/2013   Anemia 02/19/2012   Bartholin's gland abscess 09/19/2013   Bipolar disorder (HCC)    BV (bacterial vaginosis) 11/24/2015   Depression    Diabetes mellitus type I (HCC) 2001   Diagnosed at age 67 ; Type I   Diarrhea 05/30/2016   DKA (diabetic ketoacidoses) 08/19/2013   Also in 2018   ESRD (end stage renal disease) (HCC)    Gonorrhea 08/2011   Treated in 09/2011   HFrEF (heart failure with reduced ejection fraction) (HCC)    a. 2022 Echo: EF 40%; b. 10/2021 Echo: EF 55%; b. 07/2022 MV: No ischemia. EF 31%; c. 08/2022 Echo: EF 35%, mildly dil RV, sev TR.   History of trichomoniasis 05/31/2016   Hyperlipidemia 03/28/2016   Hypertension    NICM (nonischemic cardiomyopathy) (HCC)    Sepsis (HCC) 09/19/2013   Past Surgical History:  Procedure Laterality Date   A/V FISTULAGRAM Right 06/17/2023   Procedure: A/V Fistulagram;  Surgeon: Marea Selinda RAMAN, MD;  Location: [facility] INVASIVE CV LAB;  Service: Cardiovascular;  Laterality: Right;   A/V SHUNT INTERVENTION N/A 09/25/2023   Procedure: A/V SHUNT INTERVENTION;  Surgeon: Pearline Norman RAMAN, MD;  Location: [facility] PV LAB;  Service: Cardiovascular;  Laterality: N/A;   AV FISTULA PLACEMENT Right 07/06/2022   Procedure: ARTERIOVENOUS GRAFT CREATION;  Surgeon: Gretta Lonni PARAS, MD;  Location: [facility] OR;  Service: Vascular;  Laterality: Right;   CESAREAN SECTION N/A 10/05/2019    Procedure: CESAREAN SECTION;  Surgeon: Izell Harari, MD;  Location: [facility] LD ORS;  Service: Obstetrics;  Laterality: N/A;   CHOLECYSTECTOMY N/A 07/02/2023   Procedure: LAPAROSCOPIC CHOLECYSTECTOMY;  Surgeon: Ebbie Cough, MD;  Location: [facility] OR;  Service: General;  Laterality: N/A;   INCISION AND DRAINAGE ABSCESS Left 09/28/2019   Procedure: INCISION AND DRAINAGE VULVAR ABCESS;  Surgeon: Edsel Norleen GAILS, MD;  Location: [facility] OR;  Service: Gynecology;  Laterality: Left;   INCISION AND DRAINAGE PERIRECTAL ABSCESS Right 08/18/2013   Procedure: IRRIGATION AND DEBRIDEMENT GLUTEAL ABSCESS;  Surgeon: Lynda Leos, MD;  Location: [facility] OR;  Service: General;  Laterality: Right;   INCISION AND DRAINAGE PERIRECTAL ABSCESS Right 09/19/2013   Procedure: IRRIGATION AND DEBRIDEMENT RIGHT GLUTEAL AND LABIAL ABSCESSES;  Surgeon: Lynda Leos, MD;  Location: [facility] OR;  Service: General;  Laterality: Right;   INCISION AND DRAINAGE PERIRECTAL ABSCESS Right 09/24/2013   Procedure: IRRIGATION AND DEBRIDEMENT PERIRECTAL ABSCESS;  Surgeon: Lynwood MALVA Pina, MD;  Location: [facility] OR;  Service: General;  Laterality: Right;   IR PARACENTESIS  08/28/2023   IR PARACENTESIS  11/04/2023   IR PARACENTESIS  12/23/2023   Allergies  Allergen Reactions   Keflex  [Cephalexin ] Anaphylaxis    Ceftriaxone  in the past with no reaction   Penicillins Anaphylaxis, Hives and Rash   Vibramycin  [Doxycycline ] Anaphylaxis   Benadryl  [Diphenhydramine ] Itching   Dilaudid  [Hydromorphone ] Itching  Methotrexate Derivatives Rash   Roxicodone  [Oxycodone ] Itching    Physical Exam: General: The patient is alert and oriented x3 in no acute distress.  Dermatology: Skin is warm, dry and supple bilateral lower extremities. Interspaces are clear of maceration and debris.  Nails are 3 mm thick with yellow and white discoloration, subungual debris, distal onycholysis and pain with compression.  They are elongated.  There is some peeling to the plantar  aspect of both heels and along the periphery.  No blister formation is noted.  No fissures are seen.  No preulcerative calluses are present.  Vascular: Palpable pedal pulses bilaterally. Capillary refill within normal limits.  Mild edema/pitting to the lower legs and ankles bilateral.  No erythema or calor.  Neurological: Protective sensation intact using a Semmes Weinstein monofilament to both feet.  Vibratory sensation is diminished bilateral.  Temperature sensation is intact.  Assessment/Plan of Care: 1. Pain due to onychomycosis of toenails of both feet   2. Poorly controlled type 1 diabetes mellitus with peripheral neuropathy (HCC)   3. Tinea pedis of both feet      Meds ordered this encounter  Medications   ketoconazole  (NIZORAL ) 2 % cream    Sig: Apply 1 Application topically 2 (two) times daily. Apply 1gm twice daily to affected skin on feet    Dispense:  60 g    Refill:  2   Discussed clinical findings with patient today.  The mycotic nails were debrided x 10 with sterile nail nippers and a power debriding bur to decrease bulk and length  Prescription for ketoconazole  cream sent to her pharmacy to apply to the heels once daily until the skin improves.  Patient given diabetic foot education at checkout.  Follow-up in 3 to 6 months for diabetic foot check.  Awanda CHARM Imperial, DPM, FACFAS [facility]     [address] [place], [place] [address]                Office [phone]  Fax [phone]

## 2024-01-09 LAB — CULTURE, BODY FLUID W GRAM STAIN -BOTTLE: Culture: NO GROWTH

## 2024-01-11 ENCOUNTER — Emergency Department (HOSPITAL_COMMUNITY)

## 2024-01-11 ENCOUNTER — Emergency Department (HOSPITAL_COMMUNITY)
Admission: EM | Admit: 2024-01-11 | Discharge: 2024-01-12 | Disposition: A | Discharge status: Home / Self Care | Attending: Emergency Medicine | Admitting: Emergency Medicine

## 2024-01-11 DIAGNOSIS — Z91199 Patient's noncompliance with other medical treatment and regimen due to unspecified reason: Secondary | ICD-10-CM

## 2024-01-11 DIAGNOSIS — I132 Hypertensive heart and chronic kidney disease with heart failure and with stage 5 chronic kidney disease, or end stage renal disease: Secondary | ICD-10-CM | POA: Insufficient documentation

## 2024-01-11 DIAGNOSIS — I509 Heart failure, unspecified: Secondary | ICD-10-CM | POA: Insufficient documentation

## 2024-01-11 DIAGNOSIS — Z794 Long term (current) use of insulin: Secondary | ICD-10-CM | POA: Diagnosis not present

## 2024-01-11 DIAGNOSIS — N186 End stage renal disease: Secondary | ICD-10-CM | POA: Diagnosis present

## 2024-01-11 DIAGNOSIS — Z992 Dependence on renal dialysis: Secondary | ICD-10-CM | POA: Diagnosis not present

## 2024-01-11 DIAGNOSIS — I10 Essential (primary) hypertension: Secondary | ICD-10-CM

## 2024-01-11 DIAGNOSIS — R030 Elevated blood-pressure reading, without diagnosis of hypertension: Secondary | ICD-10-CM

## 2024-01-11 DIAGNOSIS — R7989 Other specified abnormal findings of blood chemistry: Secondary | ICD-10-CM

## 2024-01-11 LAB — CBC WITH DIFFERENTIAL/PLATELET
Abs Immature Granulocytes: 0.07 K/uL (ref 0.00–0.07)
Basophils Absolute: 0.1 K/uL (ref 0.0–0.1)
Basophils Relative: 1 %
Eosinophils Absolute: 1.2 K/uL — ABNORMAL HIGH (ref 0.0–0.5)
Eosinophils Relative: 10 %
HCT: 30.1 % — ABNORMAL LOW (ref 36.0–46.0)
Hemoglobin: 9 g/dL — ABNORMAL LOW (ref 12.0–15.0)
Immature Granulocytes: 1 %
Lymphocytes Relative: 32 %
Lymphs Abs: 3.8 K/uL (ref 0.7–4.0)
MCH: 27.4 pg (ref 26.0–34.0)
MCHC: 29.9 g/dL — ABNORMAL LOW (ref 30.0–36.0)
MCV: 91.8 fL (ref 80.0–100.0)
Monocytes Absolute: 1 K/uL (ref 0.1–1.0)
Monocytes Relative: 8 %
Neutro Abs: 5.8 K/uL (ref 1.7–7.7)
Neutrophils Relative %: 48 %
Platelets: 276 K/uL (ref 150–400)
RBC: 3.28 MIL/uL — ABNORMAL LOW (ref 3.87–5.11)
RDW: 16 % — ABNORMAL HIGH (ref 11.5–15.5)
WBC: 12 K/uL — ABNORMAL HIGH (ref 4.0–10.5)
nRBC: 0 % (ref 0.0–0.2)

## 2024-01-11 LAB — I-STAT CHEM 8, ED
BUN: 35 mg/dL — ABNORMAL HIGH (ref 6–20)
Calcium, Ion: 1.1 mmol/L — ABNORMAL LOW (ref 1.15–1.40)
Chloride: 106 mmol/L (ref 98–111)
Creatinine, Ser: 8.3 mg/dL — ABNORMAL HIGH (ref 0.44–1.00)
Glucose, Bld: 99 mg/dL (ref 70–99)
HCT: 30 % — ABNORMAL LOW (ref 36.0–46.0)
Hemoglobin: 10.2 g/dL — ABNORMAL LOW (ref 12.0–15.0)
Potassium: 3.7 mmol/L (ref 3.5–5.1)
Sodium: 140 mmol/L (ref 135–145)
TCO2: 20 mmol/L — ABNORMAL LOW (ref 22–32)

## 2024-01-11 LAB — COMPREHENSIVE METABOLIC PANEL WITH GFR
ALT: 31 U/L (ref 0–44)
AST: 53 U/L — ABNORMAL HIGH (ref 15–41)
Albumin: 2.5 g/dL — ABNORMAL LOW (ref 3.5–5.0)
Alkaline Phosphatase: 400 U/L — ABNORMAL HIGH (ref 38–126)
Anion gap: 14 (ref 5–15)
BUN: 37 mg/dL — ABNORMAL HIGH (ref 6–20)
CO2: 20 mmol/L — ABNORMAL LOW (ref 22–32)
Calcium: 8 mg/dL — ABNORMAL LOW (ref 8.9–10.3)
Chloride: 106 mmol/L (ref 98–111)
Creatinine, Ser: 7.49 mg/dL — ABNORMAL HIGH (ref 0.44–1.00)
GFR, Estimated: 7 mL/min — ABNORMAL LOW (ref >60)
Glucose, Bld: 107 mg/dL — ABNORMAL HIGH (ref 70–99)
Potassium: 3.8 mmol/L (ref 3.5–5.1)
Sodium: 140 mmol/L (ref 135–145)
Total Bilirubin: 0.7 mg/dL (ref 0.0–1.2)
Total Protein: 4.9 g/dL — ABNORMAL LOW (ref 6.5–8.1)

## 2024-01-11 LAB — HCG, SERUM, QUALITATIVE: Preg, Serum: NEGATIVE

## 2024-01-11 LAB — CBG MONITORING, ED: Glucose-Capillary: 122 mg/dL — ABNORMAL HIGH (ref 70–99)

## 2024-01-11 MED ORDER — FENTANYL CITRATE PF 50 MCG/ML IJ SOSY
50.0000 ug | PREFILLED_SYRINGE | Freq: Once | INTRAMUSCULAR | Status: DC
  Filled 2024-01-11: qty 1

## 2024-01-11 MED ORDER — CHLORHEXIDINE GLUCONATE CLOTH 2 % EX PADS: 6.0000 | MEDICATED_PAD | Freq: Every day | CUTANEOUS | Status: DC

## 2024-01-11 NOTE — ED Triage Notes (Signed)
 BIB [facility] for Abd pain and distention & headache. Last dialysis Last Saturday due to transportation issues. Lower extremities swollen.  CBG 68, receive 15g Oral Glucose, 122 recheck.

## 2024-01-11 NOTE — ED Notes (Signed)
 Fentanyl  is showing as medication due but is not listed under the patient in the Pyxis. Charge RN consulted.

## 2024-01-11 NOTE — ED Provider Notes (Addendum)
 K is normal.   Vascular congestion on imaging. Missed hd.   Nephrology consulted for dialysis. Discussed pt w Dr Norine- they will arrange dialysis tonight.   Recheck pt, breathing comfortably, no pain or distress.  Signed out to oncoming EDP team, Dr Franklyn fisherman f/u post dialysis, and dispo appropriately then.      Bernard Drivers, MD 01/11/24 [phone]

## 2024-01-11 NOTE — ED Notes (Signed)
 Got patient into a gown on the monitor patient is resting with call bell in reach

## 2024-01-11 NOTE — ED Provider Notes (Signed)
 [place] EMERGENCY DEPARTMENT AT [facility] Provider Note   CSN: [id number] Arrival date & time: 01/11/24  1835     Patient presents with: Abdominal Pain   Ashley Freeman is a 33 y.o. female.  {Add pertinent medical, surgical, social history, OB history to YEP:67052} Patient with history of type 1 diabetes, ESRD on hemodialysis, CHF, hypertension, hyperlipidemia presents today with complaints of missed dialysis.  She reports that she has not been dialyzed in 1 week.  She is normally a Monday, Wednesday, and Friday dialysis patient.  Reports she was able to get a ride to her dialysis this week.  She attempted to order a Lyft driver, however reports they canceled her trip and she was unable to reschedule.  Reports she has never gone this long without having dialysis.  She has been getting dialysis for over 3 years now.  Reports cough, generalized myalgias, edema, and some shortness of breath.  Also reports abdominal distention and pain.  Denies fevers or chills.  No chest pain.  The history is provided by the patient. No language interpreter was used.  Abdominal Pain      Prior to Admission medications   Medication Sig Start Date End Date Taking? Authorizing Provider  albuterol  (VENTOLIN  HFA) 108 (90 Base) MCG/ACT inhaler Inhale 2 puffs into the lungs every 4 (four) hours as needed for wheezing or shortness of breath. 11/24/22   [provider]  bumetanide  (BUMEX ) 2 MG tablet Take 10 mg by mouth daily.    [provider]  calcitRIOL  (ROCALTROL ) 0.25 MCG capsule Take 5 capsules (1.25 mcg total) by mouth every Tuesday, Thursday, and Saturday at 6 PM. Patient taking differently: Take 1.25 mcg by mouth every Monday, Wednesday, and Friday. 07/09/23   Cindy Garnette POUR, MD  carvedilol  (COREG ) 25 MG tablet Take 25 mg by mouth 2 (two) times daily with a meal.    [provider]  DULoxetine  (CYMBALTA ) 20 MG capsule Take 40 mg by mouth daily. 07/29/23    [provider]  famotidine  (PEPCID ) 20 MG tablet Take 20 mg by mouth daily. 07/29/23   [provider]  fluticasone  (FLONASE ) 50 MCG/ACT nasal spray Place 2 sprays into both nostrils daily. 12/11/23 12/10/24  [provider]  hydrOXYzine  (ATARAX ) 25 MG tablet Take 25 mg by mouth 3 (three) times daily as needed for anxiety, itching, nausea or vomiting.    [provider]  insulin  aspart (NOVOLOG ) 100 UNIT/ML injection Inject 5 Units into the skin 3 (three) times daily with meals.    [provider]  ketoconazole  (NIZORAL ) 2 % cream Apply 1 Application topically 2 (two) times daily. Apply 1gm twice daily to affected skin on feet 01/06/24   McCaughan, Dia D, DPM  lamoTRIgine  (LAMICTAL ) 200 MG tablet Take 1 tablet (200 mg total) by mouth daily. 10/29/23   Regalado, Belkys A, MD  LANTUS  SOLOSTAR 100 UNIT/ML Solostar Pen Inject 10 Units into the skin daily.    [provider]  OLANZapine  zydis (ZYPREXA ) 5 MG disintegrating tablet Take 5 mg by mouth at bedtime. 09/23/23 02/08/24  [provider]  omeprazole  (PRILOSEC) 40 MG capsule Take 40 mg by mouth daily. 12/11/23 02/09/24  [provider]  ondansetron  (ZOFRAN -ODT) 4 MG disintegrating tablet Take 1 tablet (4 mg total) by mouth every 8 (eight) hours as needed for nausea or vomiting. 06/23/23   Davis, Jonathon H, MD  Pancrelipase , Lip-Prot-Amyl, 3000-9500 units CPEP Take 3,000 units of lipase by mouth 3 (three) times  daily before meals. 09/23/23 02/08/24  [provider]  prochlorperazine  (COMPAZINE ) 5 MG tablet Take 5 mg by mouth every 6 (six) hours as needed for nausea or vomiting.    [provider]  sacubitril-valsartan (ENTRESTO) 24-26 MG Take 1 tablet by mouth 2 (two) times daily.    [provider]  sodium bicarbonate  650 MG tablet Take 650 mg by mouth 2 (two) times daily. 07/29/23   [provider]  SUMAtriptan  (IMITREX ) 50 MG tablet Take 50 mg by  mouth every 2 (two) hours as needed for migraine or headache. 06/20/22   [provider]    Allergies: Keflex  [cephalexin ], Penicillins, Vibramycin  [doxycycline ], Benadryl  [diphenhydramine ], Dilaudid  [hydromorphone ], Methotrexate derivatives, and Roxicodone  [oxycodone ]    Review of Systems  Gastrointestinal:  Positive for abdominal pain.  All other systems reviewed and are negative.   Updated Vital Signs BP (!) 166/109   Pulse 100   Temp 98.2 F (36.8 C)   Resp 20   LMP  (LMP Unknown) Comment: patient states she doesnt get her period  SpO2 100%   Physical Exam Vitals and nursing note reviewed.  Constitutional:      General: She is not in acute distress.    Appearance: Normal appearance. She is normal weight. She is not ill-appearing, toxic-appearing or diaphoretic.  HENT:     Head: Normocephalic and atraumatic.  Cardiovascular:     Rate and Rhythm: Normal rate.  Pulmonary:     Effort: Pulmonary effort is normal. No respiratory distress.  Abdominal:     General: There is distension.     Tenderness: There is abdominal tenderness.  Musculoskeletal:        General: Normal range of motion.     Cervical back: Normal range of motion.     Comments: BLE edema  Skin:    General: Skin is warm and dry.  Neurological:     General: No focal deficit present.     Mental Status: She is alert.  Psychiatric:        Mood and Affect: Mood normal.        Behavior: Behavior normal.     (all labs ordered are listed, but only abnormal results are displayed) Labs Reviewed  CBG MONITORING, ED - Abnormal; Notable for the following components:      Result Value   Glucose-Capillary 122 (*)    All other components within normal limits  CBC WITH DIFFERENTIAL/PLATELET  COMPREHENSIVE METABOLIC PANEL WITH GFR  HCG, SERUM, QUALITATIVE  I-STAT CHEM 8, ED    EKG: None  Radiology: DG Chest Portable 1 View Result Date: 01/11/2024 EXAM: 1 VIEW XRAY OF THE CHEST 01/11/2024 07:43:00 PM  COMPARISON: None available. CLINICAL HISTORY: Missed dialysis. Reason for exam: Missed Dialysis, Abdominal pain, Headache. FINDINGS: LUNGS AND PLEURA: Pulmonary vascular congestion is present. Asymmetric airspace disease is present on the right. Superimposed density of the patient's hair is present over the right hemithorax. No pleural effusion. No pneumothorax. HEART AND MEDIASTINUM: Heart is mildly enlarged. The size is exaggerated by low lung volumes. BONES AND SOFT TISSUES: No acute osseous abnormality. IMPRESSION: 1. Asymmetric airspace disease on the right. 2. Pulmonary vascular congestion. 3. Mildly enlarged heart, size exaggerated by low lung volumes. Electronically signed by: Lonni Necessary MD 01/11/2024 07:50 PM EDT RP Workstation: [id number]    {Document cardiac monitor, telemetry assessment procedure when appropriate:32947} Procedures   Medications Ordered in the ED  fentaNYL  (SUBLIMAZE ) injection 50 mcg (has no administration in time range)      {  Click here for ABCD2, HEART and other calculators REFRESH Note before signing:1}                              Medical Decision Making Amount and/or Complexity of Data Reviewed Labs: ordered. Radiology: ordered.  Risk Prescription drug management.   This patient is a 33 y.o. female who presents to the ED for concern of missed dialysis, generalized myalgias, abdominal distention.   Past Medical History / Co-morbidities / Social History:  has a past medical history of Abscess, gluteal, right (08/24/2013), Anemia (02/19/2012), Bartholin's gland abscess (09/19/2013), Bipolar disorder (HCC), BV (bacterial vaginosis) (11/24/2015), Depression, Diabetes mellitus type I (HCC) (2001), Diarrhea (05/30/2016), DKA (diabetic ketoacidoses) (08/19/2013), ESRD (end stage renal disease) (HCC), Gonorrhea (08/2011), HFrEF (heart failure with reduced ejection fraction) (HCC), History of trichomoniasis (05/31/2016), Hyperlipidemia (03/28/2016),  Hypertension, NICM (nonischemic cardiomyopathy) (HCC), and Sepsis (HCC) (09/19/2013).  Additional history: Chart reviewed. Pertinent results include: patient with numerous hospital admissions for DKA and missed dialysis  Physical Exam: Physical exam performed. The pertinent findings include: chronically unwell appearing, abdominal distention and BLE edema  Lab Tests: I ordered labs which are pending at shift change.    Imaging Studies: I ordered imaging studies including CXR. I independently visualized and interpreted imaging which showed   1. Asymmetric airspace disease on the right. 2. Pulmonary vascular congestion. 3. Mildly enlarged heart, size exaggerated by low lung volumes.  I agree with the radiologist interpretation.   Cardiac Monitoring:  The patient was maintained on a cardiac monitor.  EKG has not been obtained at shift change.   Disposition:  Patient will most likely require dialysis given she has missed 1 week of sessions, likely etiology of her symptoms. Congestion seen on CXR consistent with needing to be dialyzed. Labs and EKG are pending.   Care handoff to EDP Dr. Bernard at shift change. Please see their note for continued evaluation and dispo  Final diagnoses:  None    ED Discharge Orders     None

## 2024-01-12 ENCOUNTER — Encounter (HOSPITAL_COMMUNITY): Payer: Self-pay

## 2024-01-12 ENCOUNTER — Other Ambulatory Visit: Payer: Self-pay

## 2024-01-12 LAB — HEPATITIS B SURFACE ANTIGEN: Hepatitis B Surface Ag: NONREACTIVE

## 2024-01-12 LAB — CBG MONITORING, ED
Glucose-Capillary: 42 mg/dL — CL (ref 70–99)
Glucose-Capillary: 174 mg/dL — ABNORMAL HIGH (ref 70–99)
Glucose-Capillary: 119 mg/dL — ABNORMAL HIGH (ref 70–99)

## 2024-01-12 MED ORDER — HEPARIN SODIUM (PORCINE) 1000 UNIT/ML DIALYSIS
1000.0000 [IU] | INTRAMUSCULAR | Status: DC | PRN
Start: 2024-01-12 — End: 2024-01-12

## 2024-01-12 MED ORDER — FENTANYL CITRATE PF 50 MCG/ML IJ SOSY: 50.0000 ug | PREFILLED_SYRINGE | Freq: Once | INTRAMUSCULAR | Status: DC

## 2024-01-12 MED ORDER — ALTEPLASE 2 MG IJ SOLR: 2.0000 mg | Freq: Once | INTRAMUSCULAR | Status: DC | PRN

## 2024-01-12 MED ORDER — ANTICOAGULANT SODIUM CITRATE 4% (200MG/5ML) IV SOLN: 5.0000 mL | Status: DC | PRN

## 2024-01-12 MED ORDER — LIDOCAINE HCL (PF) 1 % IJ SOLN: 5.0000 mL | INTRAMUSCULAR | Status: DC | PRN

## 2024-01-12 MED ORDER — LIDOCAINE-PRILOCAINE 2.5-2.5 % EX CREA: 1.0000 | TOPICAL_CREAM | CUTANEOUS | Status: DC | PRN

## 2024-01-12 MED ORDER — DEXTROSE 50 % IV SOLN
INTRAVENOUS | Status: AC
  Administered 2024-01-12: 50 mL
  Filled 2024-01-12: qty 50

## 2024-01-12 MED ORDER — FENTANYL CITRATE PF 50 MCG/ML IJ SOSY
50.0000 ug | PREFILLED_SYRINGE | Freq: Once | INTRAMUSCULAR | Status: AC
  Administered 2024-01-12: 50 ug via INTRAVENOUS
  Filled 2024-01-12: qty 1

## 2024-01-12 MED ORDER — PENTAFLUOROPROP-TETRAFLUOROETH EX AERO: 1.0000 | INHALATION_SPRAY | CUTANEOUS | Status: DC | PRN

## 2024-01-12 NOTE — ED Provider Notes (Signed)
 Patient signed out to me by previous provider. Please refer to their note for full HPI.  Briefly this is a 33 year old female who presented with missed dialysis.  Noted to have vascular congestion on imaging, potassium is stable.  Nephrology consulted, previous provider discussed with Dr. Norine who is arranging dialysis today.   Patient signed out pending dialysis and reevaluation   ED Course: Patient signed out to the [facility] provider pending dialysis and reevaluation.   Bari Roxie HERO, DO 01/12/24 1541

## 2024-01-12 NOTE — ED Provider Notes (Signed)
 Pt returns from dialysis. Pt indicates feels fine, at baseline, and requests d/c, indicating her ride is here.  No sob, no chest pain, no abd pain.   Chest cta bil, rrr. Abd soft nt.   Pt currently appears stable for d/c, per her request.      Bernard Drivers, MD 01/12/24 [phone]

## 2024-01-12 NOTE — ED Notes (Signed)
 Pt left ED prior to being provided d/c paperwork.

## 2024-01-12 NOTE — ED Notes (Signed)
 Pt went to dialysis.

## 2024-01-12 NOTE — Discharge Instructions (Addendum)
 It was our pleasure to provide your ER care today - we hope that you feel better.  Make sure to make all of your dialysis sessions and do not miss or skip those sessions.   Follow up closely with primary care doctor in the next 1-2 weeks.  Also follow up closely with them regarding your blood pressure that is high and your elevated liver function tests.   Return to ER if worse, new symptoms, fevers, new/severe pain, severe headache, chest pain, trouble breathing, or other concern.

## 2024-01-12 NOTE — Progress Notes (Signed)
   01/12/24 1557  Vitals  Temp 97.8 F (36.6 C)  Pulse Rate 98  Resp 18  BP (!) 183/106  SpO2 97 %  O2 Device Room Air  Weight  (ed stretcher--unable to weigh)  Type of Weight Post-Dialysis  Oxygen Therapy  Patient Activity (if Appropriate) In bed  Pulse Oximetry Type Continuous  Oximetry Probe Site Changed No  Post Treatment  Dialyzer Clearance Lightly streaked  Hemodialysis Intake (mL) 0 mL  Liters Processed 62.8  Fluid Removed (mL) 4000 mL  Tolerated HD Treatment Yes  AVG/AVF Arterial Site Held (minutes) 10 minutes  AVG/AVF Venous Site Held (minutes) 10 minutes   Received patient in bed to unit.  Alert and oriented.  Informed consent signed and in chart.   TX duration:3.0  Patient tolerated well.  Transported back to the room  Alert, without acute distress.  Hand-off given to patient's nurse.   Access used: RUAF Access issues: no complications  Total UF removed: 4000 Medication(s) given: none   [facility]

## 2024-01-13 ENCOUNTER — Ambulatory Visit: Admitting: Family Medicine

## 2024-01-13 LAB — HEPATITIS B SURFACE ANTIBODY, QUANTITATIVE: Hep B S AB Quant (Post): <3.5 m[IU]/mL — ABNORMAL LOW

## 2024-01-14 ENCOUNTER — Ambulatory Visit: Admitting: Family Medicine

## 2024-01-14 NOTE — Progress Notes (Deleted)
 IIleana Collet, PhD, LAT, ATC acting as a scribe for Artist Lloyd, MD.  Aldean RAMAN [place] is a 33 y.o. female who presents to [facility] at [facility] today for bilat leg pain x ***   Pertinent review of systems: ***  Relevant historical information: ***   Exam:  LMP  (LMP Unknown) Comment: patient states she doesnt get her period General: Well Developed, well nourished, and in no acute distress.   MSK: ***    Lab and Radiology Results Results for orders placed or performed during the hospital encounter of 01/11/24 (from the past 72 hours)  CBG monitoring, ED     Status: Abnormal   Collection Time: 01/11/24  6:42 PM  Result Value Ref Range   Glucose-Capillary 122 (H) 70 - 99 mg/dL    Comment: Glucose reference range applies only to samples taken after fasting for at least 8 hours.  CBC with Differential     Status: Abnormal   Collection Time: 01/11/24  8:41 PM  Result Value Ref Range   WBC 12.0 (H) 4.0 - 10.5 K/uL   RBC 3.28 (L) 3.87 - 5.11 MIL/uL   Hemoglobin 9.0 (L) 12.0 - 15.0 g/dL   HCT 69.8 (L) 63.9 - 53.9 %   MCV 91.8 80.0 - 100.0 fL   MCH 27.4 26.0 - 34.0 pg   MCHC 29.9 (L) 30.0 - 36.0 g/dL   RDW 83.9 (H) 88.4 - 84.4 %   Platelets 276 150 - 400 K/uL   nRBC 0.0 0.0 - 0.2 %   Neutrophils Relative % 48 %   Neutro Abs 5.8 1.7 - 7.7 K/uL   Lymphocytes Relative 32 %   Lymphs Abs 3.8 0.7 - 4.0 K/uL   Monocytes Relative 8 %   Monocytes Absolute 1.0 0.1 - 1.0 K/uL   Eosinophils Relative 10 %   Eosinophils Absolute 1.2 (H) 0.0 - 0.5 K/uL   Basophils Relative 1 %   Basophils Absolute 0.1 0.0 - 0.1 K/uL   Immature Granulocytes 1 %   Abs Immature Granulocytes 0.07 0.00 - 0.07 K/uL    Comment: Performed at [facility], [address], [place], [place] [address]  Comprehensive metabolic panel     Status: Abnormal   Collection Time: 01/11/24  8:41 PM  Result Value Ref Range   Sodium 140 135 - 145 mmol/L   Potassium 3.8 3.5 - 5.1  mmol/L   Chloride 106 98 - 111 mmol/L   CO2 20 (L) 22 - 32 mmol/L   Glucose, Bld 107 (H) 70 - 99 mg/dL    Comment: Glucose reference range applies only to samples taken after fasting for at least 8 hours.   BUN 37 (H) 6 - 20 mg/dL   Creatinine, Ser 2.50 (H) 0.44 - 1.00 mg/dL   Calcium  8.0 (L) 8.9 - 10.3 mg/dL   Total Protein 4.9 (L) 6.5 - 8.1 g/dL   Albumin  2.5 (L) 3.5 - 5.0 g/dL   AST 53 (H) 15 - 41 U/L   ALT 31 0 - 44 U/L   Alkaline Phosphatase 400 (H) 38 - 126 U/L   Total Bilirubin 0.7 0.0 - 1.2 mg/dL   GFR, Estimated 7 (L) >60 mL/min    Comment: (NOTE) Calculated using the CKD-EPI Creatinine Equation (2021)    Anion gap 14 5 - 15    Comment: Performed at [facility], [address], [place], [place] [address]  hCG, serum, qualitative     Status:  None   Collection Time: 01/11/24  8:41 PM  Result Value Ref Range   Preg, Serum NEGATIVE NEGATIVE    Comment:        THE SENSITIVITY OF THIS METHODOLOGY IS >10 mIU/mL. Performed at [facility], [address] [place], [place] [address]   I-stat chem 8, ED (not at [facility], [facility] or [facility])     Status: Abnormal   Collection Time: 01/11/24  8:58 PM  Result Value Ref Range   Sodium 140 135 - 145 mmol/L   Potassium 3.7 3.5 - 5.1 mmol/L   Chloride 106 98 - 111 mmol/L   BUN 35 (H) 6 - 20 mg/dL   Creatinine, Ser 1.69 (H) 0.44 - 1.00 mg/dL   Glucose, Bld 99 70 - 99 mg/dL    Comment: Glucose reference range applies only to samples taken after fasting for at least 8 hours.   Calcium , Ion 1.10 (L) 1.15 - 1.40 mmol/L   TCO2 20 (L) 22 - 32 mmol/L   Hemoglobin 10.2 (L) 12.0 - 15.0 g/dL   HCT 69.9 (L) 63.9 - 53.9 %  CBG monitoring, ED     Status: Abnormal   Collection Time: 01/12/24  1:14 AM  Result Value Ref Range   Glucose-Capillary 42 (LL) 70 - 99 mg/dL    Comment: Glucose reference range applies only to samples taken after fasting for at least 8 hours.   Comment 1 Notify RN   CBG monitoring, ED     Status: Abnormal    Collection Time: 01/12/24  2:03 AM  Result Value Ref Range   Glucose-Capillary 174 (H) 70 - 99 mg/dL    Comment: Glucose reference range applies only to samples taken after fasting for at least 8 hours.  Hepatitis B surface antigen     Status: None   Collection Time: 01/12/24  3:40 AM  Result Value Ref Range   Hepatitis B Surface Ag NON REACTIVE NON REACTIVE    Comment: Performed at [facility], [name], [place], [place] [address]  Hepatitis B surface antibody,quantitative     Status: Abnormal   Collection Time: 01/12/24  3:40 AM  Result Value Ref Range   Hep B S AB Quant (Post) <3.5 (L) Immunity>10 mIU/mL    Comment: (NOTE)  Status of Immunity                     Anti-HBs Level  ------------------                     -------------- Inconsistent with Immunity                  0.0 - 10.0 Consistent with Immunity                         >10.0 Performed At: [facility] [address] [place], [place] [id number] Jennette Shorter MD Ey:[id number]   POC CBG, ED     Status: Abnormal   Collection Time: 01/12/24  3:43 AM  Result Value Ref Range   Glucose-Capillary 119 (H) 70 - 99 mg/dL    Comment: Glucose reference range applies only to samples taken after fasting for at least 8 hours.   DG Chest Portable 1 View Result Date: 01/11/2024 EXAM: 1 VIEW XRAY OF THE CHEST 01/11/2024 07:43:00 PM COMPARISON: None available. CLINICAL HISTORY: Missed dialysis. Reason for exam: Missed Dialysis, Abdominal pain, Headache. FINDINGS: LUNGS AND PLEURA: Pulmonary  vascular congestion is present. Asymmetric airspace disease is present on the right. Superimposed density of the patient's hair is present over the right hemithorax. No pleural effusion. No pneumothorax. HEART AND MEDIASTINUM: Heart is mildly enlarged. The size is exaggerated by low lung volumes. BONES AND SOFT TISSUES: No acute osseous abnormality. IMPRESSION: 1. Asymmetric airspace disease on the right. 2. Pulmonary vascular  congestion. 3. Mildly enlarged heart, size exaggerated by low lung volumes. Electronically signed by: Lonni Necessary MD 01/11/2024 07:50 PM EDT RP Workstation: [id number]       Assessment and Plan: 33 y.o. female with ***   PDMP not reviewed this encounter. No orders of the defined types were placed in this encounter.  No orders of the defined types were placed in this encounter.    Discussed warning signs or symptoms. Please see discharge instructions. Patient expresses understanding.   ***

## 2024-01-15 ENCOUNTER — Observation Stay (HOSPITAL_COMMUNITY)
Admission: EM | Admit: 2024-01-15 | Discharge: 2024-01-17 | Disposition: A | Discharge status: Home / Self Care | Attending: Internal Medicine | Admitting: Internal Medicine

## 2024-01-15 ENCOUNTER — Other Ambulatory Visit: Payer: Self-pay

## 2024-01-15 ENCOUNTER — Encounter (HOSPITAL_COMMUNITY): Payer: Self-pay | Admitting: Internal Medicine

## 2024-01-15 ENCOUNTER — Emergency Department (HOSPITAL_COMMUNITY)

## 2024-01-15 DIAGNOSIS — Z79899 Other long term (current) drug therapy: Secondary | ICD-10-CM | POA: Insufficient documentation

## 2024-01-15 DIAGNOSIS — I5042 Chronic combined systolic (congestive) and diastolic (congestive) heart failure: Secondary | ICD-10-CM | POA: Insufficient documentation

## 2024-01-15 DIAGNOSIS — J309 Allergic rhinitis, unspecified: Secondary | ICD-10-CM | POA: Diagnosis not present

## 2024-01-15 DIAGNOSIS — Z992 Dependence on renal dialysis: Secondary | ICD-10-CM | POA: Diagnosis not present

## 2024-01-15 DIAGNOSIS — E1022 Type 1 diabetes mellitus with diabetic chronic kidney disease: Secondary | ICD-10-CM | POA: Insufficient documentation

## 2024-01-15 DIAGNOSIS — F319 Bipolar disorder, unspecified: Secondary | ICD-10-CM | POA: Diagnosis present

## 2024-01-15 DIAGNOSIS — Z1152 Encounter for screening for COVID-19: Secondary | ICD-10-CM | POA: Diagnosis not present

## 2024-01-15 DIAGNOSIS — E8729 Other acidosis: Secondary | ICD-10-CM | POA: Diagnosis present

## 2024-01-15 DIAGNOSIS — E109 Type 1 diabetes mellitus without complications: Secondary | ICD-10-CM | POA: Diagnosis present

## 2024-01-15 DIAGNOSIS — D631 Anemia in chronic kidney disease: Secondary | ICD-10-CM | POA: Diagnosis not present

## 2024-01-15 DIAGNOSIS — E1010 Type 1 diabetes mellitus with ketoacidosis without coma: Secondary | ICD-10-CM

## 2024-01-15 DIAGNOSIS — N189 Chronic kidney disease, unspecified: Secondary | ICD-10-CM

## 2024-01-15 DIAGNOSIS — I132 Hypertensive heart and chronic kidney disease with heart failure and with stage 5 chronic kidney disease, or end stage renal disease: Secondary | ICD-10-CM | POA: Insufficient documentation

## 2024-01-15 DIAGNOSIS — Z862 Personal history of diseases of the blood and blood-forming organs and certain disorders involving the immune mechanism: Secondary | ICD-10-CM

## 2024-01-15 DIAGNOSIS — E875 Hyperkalemia: Secondary | ICD-10-CM | POA: Diagnosis not present

## 2024-01-15 DIAGNOSIS — N186 End stage renal disease: Secondary | ICD-10-CM | POA: Diagnosis not present

## 2024-01-15 DIAGNOSIS — R188 Other ascites: Principal | ICD-10-CM | POA: Insufficient documentation

## 2024-01-15 DIAGNOSIS — R1084 Generalized abdominal pain: Secondary | ICD-10-CM | POA: Diagnosis not present

## 2024-01-15 DIAGNOSIS — R109 Unspecified abdominal pain: Secondary | ICD-10-CM | POA: Diagnosis present

## 2024-01-15 DIAGNOSIS — J301 Allergic rhinitis due to pollen: Secondary | ICD-10-CM

## 2024-01-15 LAB — CBC WITH DIFFERENTIAL/PLATELET
Basophils Absolute: 0.4 K/uL — ABNORMAL HIGH (ref 0.0–0.1)
Basophils Relative: 4 %
Eosinophils Absolute: 1.4 K/uL — ABNORMAL HIGH (ref 0.0–0.5)
Eosinophils Relative: 13 %
HCT: 37.5 % (ref 36.0–46.0)
Hemoglobin: 10.9 g/dL — ABNORMAL LOW (ref 12.0–15.0)
Lymphocytes Relative: 34 %
Lymphs Abs: 3.6 K/uL (ref 0.7–4.0)
MCH: 27.3 pg (ref 26.0–34.0)
MCHC: 29.1 g/dL — ABNORMAL LOW (ref 30.0–36.0)
MCV: 93.8 fL (ref 80.0–100.0)
Monocytes Absolute: 0 K/uL — ABNORMAL LOW (ref 0.1–1.0)
Monocytes Relative: 0 %
Neutro Abs: 5.1 K/uL (ref 1.7–7.7)
Neutrophils Relative %: 49 %
Platelets: 278 K/uL (ref 150–400)
RBC: 4 MIL/uL (ref 3.87–5.11)
RDW: 15.9 % — ABNORMAL HIGH (ref 11.5–15.5)
WBC: 10.5 K/uL (ref 4.0–10.5)
nRBC: 0 % (ref 0.0–0.2)

## 2024-01-15 LAB — I-STAT CHEM 8, ED
BUN: 36 mg/dL — ABNORMAL HIGH (ref 6–20)
Calcium, Ion: 1.05 mmol/L — ABNORMAL LOW (ref 1.15–1.40)
Chloride: 107 mmol/L (ref 98–111)
Creatinine, Ser: 7.9 mg/dL — ABNORMAL HIGH (ref 0.44–1.00)
Glucose, Bld: 177 mg/dL — ABNORMAL HIGH (ref 70–99)
HCT: 33 % — ABNORMAL LOW (ref 36.0–46.0)
Hemoglobin: 11.2 g/dL — ABNORMAL LOW (ref 12.0–15.0)
Potassium: 5.5 mmol/L — ABNORMAL HIGH (ref 3.5–5.1)
Sodium: 138 mmol/L (ref 135–145)
TCO2: 21 mmol/L — ABNORMAL LOW (ref 22–32)

## 2024-01-15 LAB — COMPREHENSIVE METABOLIC PANEL WITH GFR
ALT: 43 U/L (ref 0–44)
AST: 77 U/L — ABNORMAL HIGH (ref 15–41)
Albumin: 3.1 g/dL — ABNORMAL LOW (ref 3.5–5.0)
Alkaline Phosphatase: 557 U/L — ABNORMAL HIGH (ref 38–126)
Anion gap: 21 — ABNORMAL HIGH (ref 5–15)
BUN: 38 mg/dL — ABNORMAL HIGH (ref 6–20)
CO2: 18 mmol/L — ABNORMAL LOW (ref 22–32)
Calcium: 8.7 mg/dL — ABNORMAL LOW (ref 8.9–10.3)
Chloride: 103 mmol/L (ref 98–111)
Creatinine, Ser: 7.32 mg/dL — ABNORMAL HIGH (ref 0.44–1.00)
GFR, Estimated: 7 mL/min — ABNORMAL LOW (ref >60)
Glucose, Bld: 172 mg/dL — ABNORMAL HIGH (ref 70–99)
Potassium: 5.7 mmol/L — ABNORMAL HIGH (ref 3.5–5.1)
Sodium: 142 mmol/L (ref 135–145)
Total Bilirubin: 1 mg/dL (ref 0.0–1.2)
Total Protein: 5.9 g/dL — ABNORMAL LOW (ref 6.5–8.1)

## 2024-01-15 LAB — HCG, SERUM, QUALITATIVE: Preg, Serum: NEGATIVE

## 2024-01-15 LAB — LIPASE, BLOOD: Lipase: 24 U/L (ref 11–51)

## 2024-01-15 LAB — CBG MONITORING, ED
Glucose-Capillary: 177 mg/dL — ABNORMAL HIGH (ref 70–99)
Glucose-Capillary: 89 mg/dL (ref 70–99)

## 2024-01-15 LAB — RESP PANEL BY RT-PCR (RSV, FLU A&B, COVID)  RVPGX2
Influenza A by PCR: NEGATIVE
Influenza B by PCR: NEGATIVE
Resp Syncytial Virus by PCR: NEGATIVE
SARS Coronavirus 2 by RT PCR: NEGATIVE

## 2024-01-15 MED ORDER — ACETAMINOPHEN 325 MG PO TABS
650.0000 mg | ORAL_TABLET | Freq: Four times a day (QID) | ORAL | Status: DC | PRN
  Administered 2024-01-16: 650 mg via ORAL
  Filled 2024-01-15: qty 2

## 2024-01-15 MED ORDER — HYDROXYZINE HCL 25 MG PO TABS
25.0000 mg | ORAL_TABLET | Freq: Three times a day (TID) | ORAL | Status: DC | PRN
  Administered 2024-01-15 – 2024-01-16 (×3): 25 mg via ORAL
  Filled 2024-01-15 (×3): qty 1

## 2024-01-15 MED ORDER — ONDANSETRON HCL 4 MG/2ML IJ SOLN: 4.0000 mg | Freq: Four times a day (QID) | INTRAMUSCULAR | Status: DC | PRN

## 2024-01-15 MED ORDER — MORPHINE SULFATE (PF) 4 MG/ML IV SOLN
4.0000 mg | INTRAVENOUS | Status: DC | PRN
  Administered 2024-01-15 – 2024-01-16 (×6): 4 mg via INTRAVENOUS
  Filled 2024-01-15 (×7): qty 1

## 2024-01-15 MED ORDER — NALOXONE HCL 0.4 MG/ML IJ SOLN: 0.4000 mg | INTRAMUSCULAR | Status: DC | PRN

## 2024-01-15 MED ORDER — METOCLOPRAMIDE HCL 5 MG/ML IJ SOLN
10.0000 mg | Freq: Once | INTRAMUSCULAR | Status: AC
  Administered 2024-01-15: 10 mg via INTRAVENOUS
  Filled 2024-01-15: qty 2

## 2024-01-15 MED ORDER — ACETAMINOPHEN 650 MG RE SUPP: 650.0000 mg | Freq: Four times a day (QID) | RECTAL | Status: DC | PRN

## 2024-01-15 MED ORDER — MELATONIN 3 MG PO TABS
3.0000 mg | ORAL_TABLET | Freq: Every evening | ORAL | Status: DC | PRN
  Administered 2024-01-16: 3 mg via ORAL
  Filled 2024-01-15: qty 1

## 2024-01-15 MED ORDER — MORPHINE SULFATE (PF) 2 MG/ML IV SOLN
2.0000 mg | Freq: Once | INTRAVENOUS | Status: AC
  Administered 2024-01-15: 2 mg via INTRAVENOUS
  Filled 2024-01-15: qty 1

## 2024-01-15 MED ORDER — DEXTROSE 50 % IV SOLN
1.0000 | Freq: Once | INTRAVENOUS | Status: AC
  Administered 2024-01-15: 50 mL via INTRAVENOUS
  Filled 2024-01-15: qty 50

## 2024-01-15 MED ORDER — FENTANYL CITRATE PF 50 MCG/ML IJ SOSY: 50.0000 ug | PREFILLED_SYRINGE | INTRAMUSCULAR | Status: DC | PRN

## 2024-01-15 MED ORDER — INSULIN ASPART 100 UNIT/ML IV SOLN
5.0000 [IU] | Freq: Once | INTRAVENOUS | Status: AC
  Administered 2024-01-15: 5 [IU] via INTRAVENOUS

## 2024-01-15 MED ORDER — SODIUM ZIRCONIUM CYCLOSILICATE 10 G PO PACK
10.0000 g | PACK | Freq: Once | ORAL | Status: AC
  Administered 2024-01-15: 10 g via ORAL
  Filled 2024-01-15: qty 1

## 2024-01-15 MED ORDER — INSULIN ASPART 100 UNIT/ML IJ SOLN
0.0000 [IU] | Freq: Every day | INTRAMUSCULAR | Status: DC
  Administered 2024-01-16: 2 [IU] via SUBCUTANEOUS

## 2024-01-15 MED ORDER — INSULIN ASPART 100 UNIT/ML IJ SOLN
0.0000 [IU] | Freq: Three times a day (TID) | INTRAMUSCULAR | Status: DC
  Administered 2024-01-16: 2 [IU] via SUBCUTANEOUS
  Administered 2024-01-16: 1 [IU] via SUBCUTANEOUS
  Administered 2024-01-17: 5 [IU] via SUBCUTANEOUS

## 2024-01-15 MED ORDER — INSULIN GLARGINE 100 UNITS/ML SOLOSTAR PEN
5.0000 [IU] | PEN_INJECTOR | Freq: Every day | SUBCUTANEOUS | Status: DC
Start: 2024-01-15 — End: 2024-01-16
  Administered 2024-01-15: 5 [IU] via SUBCUTANEOUS
  Filled 2024-01-15: qty 3

## 2024-01-15 MED ORDER — MORPHINE SULFATE (PF) 4 MG/ML IV SOLN
4.0000 mg | Freq: Once | INTRAVENOUS | Status: AC
  Administered 2024-01-15: 4 mg via INTRAVENOUS
  Filled 2024-01-15: qty 1

## 2024-01-15 NOTE — ED Notes (Signed)
 Call placed to C[facility] for cardiac monitoring. CCMD tech states patient not found in system at this time but they will continue trying

## 2024-01-15 NOTE — ED Provider Notes (Cosign Needed Addendum)
 [facility] AT [facility] Provider Note   CSN: [id number] Arrival date & time: 01/15/24  1619     Patient presents with: Pain  HPI Ashley Freeman is a 33 y.o. female with ESRD, type 1 diabetes, CHF, hypertension presenting for pain.  She states she has had generalized pain for last couple of days.  Normally goes to dialysis at [facility] Monday Wednesday Friday.  She missed Monday Wednesday but did receive her dialysis here at [facility].  She states that she has had a lot of stuff going on at home difficult to go to her appointment.  She endorses a headache that started this morning it is mild and like headaches she has had in the past.  Denies chest pain but is short of breath.  Also mention that her abdomen is very distended started 2 days ago and endorsing generalized abdominal pain.  Denies nausea vomiting diarrhea, urinary or vaginal symptoms.   HPI     Prior to Admission medications   Medication Sig Start Date End Date Taking? Authorizing Provider  albuterol  (VENTOLIN  HFA) 108 (90 Base) MCG/ACT inhaler Inhale 2 puffs into the lungs every 4 (four) hours as needed for wheezing or shortness of breath. 11/24/22   [provider]  bumetanide  (BUMEX ) 2 MG tablet Take 10 mg by mouth daily.    [provider]  calcitRIOL  (ROCALTROL ) 0.25 MCG capsule Take 5 capsules (1.25 mcg total) by mouth every Tuesday, Thursday, and Saturday at 6 PM. Patient taking differently: Take 1.25 mcg by mouth every Monday, Wednesday, and Friday. 07/09/23   Cindy Garnette POUR, MD  carvedilol  (COREG ) 25 MG tablet Take 25 mg by mouth 2 (two) times daily with a meal.    [provider]  DULoxetine  (CYMBALTA ) 20 MG capsule Take 40 mg by mouth daily. 07/29/23   [provider]  famotidine  (PEPCID ) 20 MG tablet Take 20 mg by mouth daily. 07/29/23   [provider]  fluticasone  (FLONASE ) 50 MCG/ACT nasal spray Place 2 sprays into both nostrils  daily. 12/11/23 12/10/24  [provider]  hydrOXYzine  (ATARAX ) 25 MG tablet Take 25 mg by mouth 3 (three) times daily as needed for anxiety, itching, nausea or vomiting.    [provider]  insulin  aspart (NOVOLOG ) 100 UNIT/ML injection Inject 5 Units into the skin 3 (three) times daily with meals.    [provider]  ketoconazole  (NIZORAL ) 2 % cream Apply 1 Application topically 2 (two) times daily. Apply 1gm twice daily to affected skin on feet 01/06/24   McCaughan, Dia D, DPM  lamoTRIgine  (LAMICTAL ) 200 MG tablet Take 1 tablet (200 mg total) by mouth daily. 10/29/23   Regalado, Belkys A, MD  LANTUS  SOLOSTAR 100 UNIT/ML Solostar Pen Inject 10 Units into the skin daily.    [provider]  OLANZapine  zydis (ZYPREXA ) 5 MG disintegrating tablet Take 5 mg by mouth at bedtime. 09/23/23 02/08/24  [provider]  omeprazole  (PRILOSEC) 40 MG capsule Take 40 mg by mouth daily. 12/11/23 02/09/24  [provider]  ondansetron  (ZOFRAN -ODT) 4 MG disintegrating tablet Take 1 tablet (4 mg total) by mouth every 8 (eight) hours as needed for nausea or vomiting. 06/23/23   Davis, Jonathon H, MD  Pancrelipase , Lip-Prot-Amyl, 3000-9500 units CPEP Take 3,000 units of lipase by mouth 3 (three) times daily before meals. 09/23/23 02/08/24  [provider]  prochlorperazine  (COMPAZINE ) 5 MG tablet Take 5 mg by mouth every 6 (six) hours as needed for nausea  or vomiting.    [provider]  sacubitril-valsartan (ENTRESTO) 24-26 MG Take 1 tablet by mouth 2 (two) times daily.    [provider]  sodium bicarbonate  650 MG tablet Take 650 mg by mouth 2 (two) times daily. 07/29/23   [provider]  SUMAtriptan  (IMITREX ) 50 MG tablet Take 50 mg by mouth every 2 (two) hours as needed for migraine or headache. 06/20/22   [provider]    Allergies: Keflex  [cephalexin ], Penicillins, Vibramycin  [doxycycline ], Benadryl  [diphenhydramine ],  Dilaudid  [hydromorphone ], Methotrexate derivatives, and Roxicodone  [oxycodone ]    Review of Systems See HPI  Updated Vital Signs BP (!) 144/93   Pulse 84   Temp 97.7 F (36.5 C) (Oral)   Resp 14   Ht 5' 3 (1.6 m)   Wt 64.3 kg   LMP  (LMP Unknown) Comment: patient states she doesnt get her period  SpO2 100%   BMI 25.12 kg/m   Physical Exam Vitals and nursing note reviewed.  HENT:     Head: Normocephalic and atraumatic.     Mouth/Throat:     Mouth: Mucous membranes are moist.  Eyes:     General:        Right eye: No discharge.        Left eye: No discharge.     Conjunctiva/sclera: Conjunctivae normal.  Cardiovascular:     Rate and Rhythm: Normal rate and regular rhythm.     Pulses: Normal pulses.     Heart sounds: Normal heart sounds.  Pulmonary:     Effort: Pulmonary effort is normal.     Breath sounds: Normal breath sounds.  Abdominal:     General: Abdomen is flat. There is distension.     Palpations: Abdomen is soft.     Tenderness: There is generalized abdominal tenderness.  Skin:    General: Skin is warm and dry.  Neurological:     General: No focal deficit present.  Psychiatric:        Mood and Affect: Mood normal.     (all labs ordered are listed, but only abnormal results are displayed) Labs Reviewed  CBC WITH DIFFERENTIAL/PLATELET - Abnormal; Notable for the following components:      Result Value   Hemoglobin 10.9 (*)    MCHC 29.1 (*)    RDW 15.9 (*)    Monocytes Absolute 0.0 (*)    Eosinophils Absolute 1.4 (*)    Basophils Absolute 0.4 (*)    All other components within normal limits  COMPREHENSIVE METABOLIC PANEL WITH GFR - Abnormal; Notable for the following components:   Potassium 5.7 (*)    CO2 18 (*)    Glucose, Bld 172 (*)    BUN 38 (*)    Creatinine, Ser 7.32 (*)    Calcium  8.7 (*)    Total Protein 5.9 (*)    Albumin  3.1 (*)    AST 77 (*)    Alkaline Phosphatase 557 (*)    GFR, Estimated 7 (*)    Anion gap 21 (*)    All other  components within normal limits  I-STAT CHEM 8, ED - Abnormal; Notable for the following components:   Potassium 5.5 (*)    BUN 36 (*)    Creatinine, Ser 7.90 (*)    Glucose, Bld 177 (*)    Calcium , Ion 1.05 (*)    TCO2 21 (*)    Hemoglobin 11.2 (*)    HCT 33.0 (*)    All other components within normal limits  RESP PANEL BY RT-PCR (RSV, FLU A&B, COVID)  RVPGX2  LIPASE, BLOOD  HCG, SERUM, QUALITATIVE  URINALYSIS, ROUTINE W REFLEX MICROSCOPIC  CBC WITH DIFFERENTIAL/PLATELET  COMPREHENSIVE METABOLIC PANEL WITH GFR  MAGNESIUM   PHOSPHORUS  BASIC METABOLIC PANEL WITH GFR    EKG: None  Radiology: CT ABDOMEN PELVIS WO CONTRAST Result Date: 01/15/2024 CLINICAL DATA:  Abdominal pain, acute, nonlocalized generalized pain and missed dialysis. Dialysis days are MWF- states had a family emergency today. EXAM: CT ABDOMEN AND PELVIS WITHOUT CONTRAST TECHNIQUE: Multidetector CT imaging of the abdomen and pelvis was performed following the standard protocol without IV contrast. RADIATION DOSE REDUCTION: This exam was performed according to the departmental dose-optimization program which includes automated exposure control, adjustment of the mA and/or kV according to patient size and/or use of iterative reconstruction technique. COMPARISON:  CT abdomen pelvis 01/04/2024 FINDINGS: Lower chest: Interval increase in trace right pleural effusion. Bilateral lower lobe atelectasis. Enlarged cardiac silhouette. Cardiac findings consistent with anemia. Hepatobiliary: The liver is enlarged measuring up to 21 cm. No focal liver abnormality. Status post cholecystectomy. No biliary dilatation. Pancreas: No focal lesion. Normal pancreatic contour. No surrounding inflammatory changes. No main pancreatic ductal dilatation. Spleen: Normal in size without focal abnormality. Adrenals/Urinary Tract: No adrenal nodule bilaterally. No nephrolithiasis and no hydronephrosis. No definite contour-deforming renal mass. No  ureterolithiasis or hydroureter. The urinary bladder is unremarkable. Stomach/Bowel: Stomach is within normal limits. No evidence of bowel wall thickening or dilatation. Appendix appears normal. Vascular/Lymphatic: No abdominal aorta or iliac aneurysm. Mild atherosclerotic plaque of the aorta and its branches. Similar-appearing multiple prominent retroperitoneal and pelvic lymph nodes. No abdominal, pelvic, or inguinal lymphadenopathy. Reproductive: Uterus and bilateral adnexa are unremarkable. Other: Moderate simple free fluid. No intraperitoneal free gas. No organized fluid collection. Musculoskeletal: No abdominal wall hernia or abnormality. Diffuse subcutaneus soft tissue edema. No suspicious lytic or blastic osseous lesions. No acute displaced fracture. IMPRESSION: 1. Findings suggestive of volume overload. 2. Interval increase in trace right pleural effusion. 3. Moderate volume simple ascites. 4. Diffuse subcutaneus soft tissue edema. 5. Similar-appearing multiple prominent retroperitoneal and pelvic lymph nodes. 6. Hepatomegaly. 7. Cardiomegaly. Electronically Signed   By: Morgane  Naveau M.D.   On: 01/15/2024 18:49   DG Chest 2 View Result Date: 01/15/2024 CLINICAL DATA:  shob EXAM: CHEST - 2 VIEW COMPARISON:  Chest x-ray 01/11/2024, CT chest 07/15/2023 FINDINGS: The heart and mediastinal contours are unchanged. Low lung volumes. Persistent mild interstitial airspace opacities. No pleural effusion. No pneumothorax. No acute osseous abnormality. IMPRESSION: Low lung volumes.  Persistent mild interstitial airspace opacities. Electronically Signed   By: Morgane  Naveau M.D.   On: 01/15/2024 18:15     Procedures   Medications Ordered in the ED  acetaminophen  (TYLENOL ) tablet 650 mg (has no administration in time range)    Or  acetaminophen  (TYLENOL ) suppository 650 mg (has no administration in time range)  melatonin tablet 3 mg (has no administration in time range)  ondansetron  (ZOFRAN ) injection 4  mg (has no administration in time range)  naloxone  (NARCAN ) injection 0.4 mg (has no administration in time range)  fentaNYL  (SUBLIMAZE ) injection 50 mcg (has no administration in time range)  insulin  aspart (novoLOG ) injection 0-9 Units (has no administration in time range)  insulin  aspart (novoLOG ) injection 0-5 Units (has no administration in time range)  metoCLOPramide  (REGLAN ) injection 10 mg (10 mg Intravenous Given 01/15/24 1700)  morphine  (PF) 4 MG/ML injection 4 mg (4 mg Intravenous Given 01/15/24 1700)  morphine  (PF) 2 MG/ML injection  2 mg (2 mg Intravenous Given 01/15/24 1834)  insulin  aspart (novoLOG ) injection 5 Units (5 Units Intravenous Given 01/15/24 1911)    And  dextrose  50 % solution 50 mL (50 mLs Intravenous Given 01/15/24 1913)  sodium zirconium cyclosilicate  (LOKELMA ) packet 10 g (10 g Oral Given 01/15/24 1912)    Clinical Course as of 01/15/24 2006  Wed Jan 15, 2024  1901 Discussed patient with Dr. Windle of nephrology who agreed to list her for dialysis tomorrow morning. [JR]    Clinical Course User Index [JR] Lang Norleen POUR, PA-C                                 Medical Decision Making Amount and/or Complexity of Data Reviewed Labs: ordered. Radiology: ordered.  Risk OTC drugs. Prescription drug management. Decision regarding hospitalization.   Initial Impression and Ddx 33 year old well-appearing female presenting for headache and generalized pain.  Exam notable abdominal distention, generalized tenderness.  DDx includes volume overload, SBP, acute liver failure, bowel obstruction, electrolyte derangement, other. Patient PMH that increases complexity of ED encounter:  ESRD, type 1 diabetes, CHF, hypertension   Interpretation of Diagnostics - I independent reviewed and interpreted the labs as followed: Hyperkalemia 5.7, alk phos elevated 557, hyperglycemia 172, anion gap 21  - I independently visualized the following imaging with scope of interpretation  limited to determining acute life threatening conditions related to emergency care: CT abdomen pelvis, which revealed moderate ascites, chest x-ray persistent mild airspace opacities  Patient Reassessment and Ultimate Disposition/Management Workup suggestive of volume overload likely secondary to missing dialysis for the last 2 sessions.  Discussed patient with Dr. Windle nephrology who recommended dialysis in the morning.  Reviewed her chart and she has had a recent admission that required dialysis and paracentesis.  Considered SBP but feel its unlikely given white count is normal, vitals are reassuring and she looks well.  For her hyperkalemia gave Lokelma , insulin  and dextrose . Admitted to hospital service with Dr. Eva Pore.   Patient management required discussion with the following services or consulting groups:  Hospitalist Service and Nephrology  Complexity of Problems Addressed Acute complicated illness or Injury  Additional Data Reviewed and Analyzed Further history obtained from: Past medical history and medications listed in the EMR and Prior ED visit notes  Patient Encounter Risk Assessment Consideration of hospitalization    Final diagnoses:  Other ascites    ED Discharge Orders     None        Lang Norleen POUR, PA-C 01/15/24 2007    Lowther, Amy, DO 01/16/24 1733

## 2024-01-15 NOTE — H&P (Signed)
 History and Physical      Ashley Freeman FMW:[id number] DOB: 01-29-1991 DOA: 01/15/2024; DOS: 01/15/2024  PCP: Keven Crumbly Pap, MD *** Patient coming from: home ***  I have personally briefly reviewed patient's old medical records in [facility]  Chief Complaint: ***  HPI: Ashley Freeman is a 33 y.o. female with medical history significant for *** who is admitted to [facility] on 01/15/2024 with *** after presenting from home*** to [facility] ED complaining of ***.    ***       ***   ED Course:  Vital signs in the ED were notable for the following: ***  Labs were notable for the following: ***  Per my interpretation, EKG in ED demonstrated the following:  ***  Imaging in the ED, per corresponding formal radiology read, was notable for the following:  ***  EDP discussed with on-call nephrology, Dr. Darden, who conveyed that nephrology will formally consult and are planning for HD in the AM (9/18).  While in the ED, the following were administered: ***  Subsequently, the patient was admitted  ***  ***red    Review of Systems: As per HPI otherwise 10 point review of systems negative.   Past Medical History:  Diagnosis Date   Abscess, gluteal, right 08/24/2013   Anemia 02/19/2012   Bartholin's gland abscess 09/19/2013   Bipolar disorder (HCC)    BV (bacterial vaginosis) 11/24/2015   Depression    Diabetes mellitus type I (HCC) 2001   Diagnosed at age 65 ; Type I   Diarrhea 05/30/2016   DKA (diabetic ketoacidoses) 08/19/2013   Also in 2018   ESRD (end stage renal disease) (HCC)    Gonorrhea 08/2011   Treated in 09/2011   HFrEF (heart failure with reduced ejection fraction) (HCC)    a. 2022 Echo: EF 40%; b. 10/2021 Echo: EF 55%; b. 07/2022 MV: No ischemia. EF 31%; c. 08/2022 Echo: EF 35%, mildly dil RV, sev TR.   History of trichomoniasis 05/31/2016   Hyperlipidemia 03/28/2016   Hypertension    NICM (nonischemic  cardiomyopathy) (HCC)    Sepsis (HCC) 09/19/2013    Past Surgical History:  Procedure Laterality Date   A/V FISTULAGRAM Right 06/17/2023   Procedure: A/V Fistulagram;  Surgeon: Marea Selinda GORMAN, MD;  Location: [facility] INVASIVE CV LAB;  Service: Cardiovascular;  Laterality: Right;   A/V SHUNT INTERVENTION N/A 09/25/2023   Procedure: A/V SHUNT INTERVENTION;  Surgeon: Pearline Norman GORMAN, MD;  Location: [facility] PV LAB;  Service: Cardiovascular;  Laterality: N/A;   AV FISTULA PLACEMENT Right 07/06/2022   Procedure: ARTERIOVENOUS GRAFT CREATION;  Surgeon: Gretta Lonni PARAS, MD;  Location: [facility] OR;  Service: Vascular;  Laterality: Right;   CESAREAN SECTION N/A 10/05/2019   Procedure: CESAREAN SECTION;  Surgeon: Izell Harari, MD;  Location: [facility] LD ORS;  Service: Obstetrics;  Laterality: N/A;   CHOLECYSTECTOMY N/A 07/02/2023   Procedure: LAPAROSCOPIC CHOLECYSTECTOMY;  Surgeon: Ebbie Cough, MD;  Location: [facility] OR;  Service: General;  Laterality: N/A;   INCISION AND DRAINAGE ABSCESS Left 09/28/2019   Procedure: INCISION AND DRAINAGE VULVAR ABCESS;  Surgeon: Edsel Norleen GAILS, MD;  Location: [facility] OR;  Service: Gynecology;  Laterality: Left;   INCISION AND DRAINAGE PERIRECTAL ABSCESS Right 08/18/2013   Procedure: IRRIGATION AND DEBRIDEMENT GLUTEAL ABSCESS;  Surgeon: Lynda Leos, MD;  Location: [facility] OR;  Service: General;  Laterality: Right;   INCISION AND DRAINAGE PERIRECTAL ABSCESS Right 09/19/2013   Procedure: IRRIGATION AND DEBRIDEMENT RIGHT GLUTEAL AND LABIAL ABSCESSES;  Surgeon: Lynda Leos, MD;  Location: [facility] OR;  Service: General;  Laterality: Right;   INCISION AND DRAINAGE PERIRECTAL ABSCESS Right 09/24/2013   Procedure: IRRIGATION AND DEBRIDEMENT PERIRECTAL ABSCESS;  Surgeon: Lynwood MALVA Pina, MD;  Location: [facility] OR;  Service: General;  Laterality: Right;   IR PARACENTESIS  08/28/2023   IR PARACENTESIS  11/04/2023   IR PARACENTESIS  12/23/2023    Social History:  reports that she has never smoked. She  has never been exposed to tobacco smoke. She has never used smokeless tobacco. She reports that she does not currently use alcohol. She reports that she does not use drugs.   Allergies  Allergen Reactions   Keflex  [Cephalexin ] Anaphylaxis    Ceftriaxone  in the past with no reaction   Penicillins Anaphylaxis, Hives and Rash   Vibramycin  [Doxycycline ] Anaphylaxis   Benadryl  [Diphenhydramine ] Itching   Dilaudid  [Hydromorphone ] Itching   Methotrexate Derivatives Rash   Roxicodone  [Oxycodone ] Itching    Family History  Problem Relation Age of Onset   Asthma Mother    Carpal tunnel syndrome Mother    Gout Father    Diabetes Paternal Grandmother    Anesthesia problems Neg Hx     Family history reviewed and not pertinent ***   Prior to Admission medications   Medication Sig Start Date End Date Taking? Authorizing Provider  albuterol  (VENTOLIN  HFA) 108 (90 Base) MCG/ACT inhaler Inhale 2 puffs into the lungs every 4 (four) hours as needed for wheezing or shortness of breath. 11/24/22   [provider]  bumetanide  (BUMEX ) 2 MG tablet Take 10 mg by mouth daily.    [provider]  calcitRIOL  (ROCALTROL ) 0.25 MCG capsule Take 5 capsules (1.25 mcg total) by mouth every Tuesday, Thursday, and Saturday at 6 PM. Patient taking differently: Take 1.25 mcg by mouth every Monday, Wednesday, and Friday. 07/09/23   Cindy Garnette POUR, MD  carvedilol  (COREG ) 25 MG tablet Take 25 mg by mouth 2 (two) times daily with a meal.    [provider]  DULoxetine  (CYMBALTA ) 20 MG capsule Take 40 mg by mouth daily. 07/29/23   [provider]  famotidine  (PEPCID ) 20 MG tablet Take 20 mg by mouth daily. 07/29/23   [provider]  fluticasone  (FLONASE ) 50 MCG/ACT nasal spray Place 2 sprays into both nostrils daily. 12/11/23 12/10/24  [provider]  hydrOXYzine  (ATARAX ) 25 MG tablet Take 25 mg by mouth 3 (three) times daily as needed for anxiety, itching, nausea or  vomiting.    [provider]  insulin  aspart (NOVOLOG ) 100 UNIT/ML injection Inject 5 Units into the skin 3 (three) times daily with meals.    [provider]  ketoconazole  (NIZORAL ) 2 % cream Apply 1 Application topically 2 (two) times daily. Apply 1gm twice daily to affected skin on feet 01/06/24   McCaughan, Dia D, DPM  lamoTRIgine  (LAMICTAL ) 200 MG tablet Take 1 tablet (200 mg total) by mouth daily. 10/29/23   Regalado, Belkys A, MD  LANTUS  SOLOSTAR 100 UNIT/ML Solostar Pen Inject 10 Units into the skin daily.    [provider]  OLANZapine  zydis (ZYPREXA ) 5 MG disintegrating tablet Take 5 mg by mouth at bedtime. 09/23/23 02/08/24  [provider]  omeprazole  (PRILOSEC) 40 MG capsule Take 40 mg by mouth daily. 12/11/23 02/09/24  [provider]  ondansetron  (ZOFRAN -ODT) 4 MG disintegrating tablet Take 1 tablet (4 mg total) by mouth every 8 (eight) hours as needed for nausea or vomiting. 06/23/23   Davis, Jonathon H,  MD  Pancrelipase , Lip-Prot-Amyl, 3000-9500 units CPEP Take 3,000 units of lipase by mouth 3 (three) times daily before meals. 09/23/23 02/08/24  [provider]  prochlorperazine  (COMPAZINE ) 5 MG tablet Take 5 mg by mouth every 6 (six) hours as needed for nausea or vomiting.    [provider]  sacubitril-valsartan (ENTRESTO) 24-26 MG Take 1 tablet by mouth 2 (two) times daily.    [provider]  sodium bicarbonate  650 MG tablet Take 650 mg by mouth 2 (two) times daily. 07/29/23   [provider]  SUMAtriptan  (IMITREX ) 50 MG tablet Take 50 mg by mouth every 2 (two) hours as needed for migraine or headache. 06/20/22   [provider]     Objective    Physical Exam: Vitals:   01/15/24 1620 01/15/24 1715 01/15/24 1833 01/15/24 1845  BP:  (!) 145/96 (!) 148/100 (!) 144/93  Pulse:  87 84 84  Resp:  19 20 14   Temp:      TempSrc:      SpO2:  100% 100% 100%  Weight: 64.3 kg     Height: 5' 3 (1.6 m)        General: appears to be stated age; alert, oriented Skin: warm, dry, no rash Head:  AT/[place] Mouth:  Oral mucosa membranes appear moist, normal dentition Neck: supple; trachea midline Heart:  RRR; did not appreciate any M/R/G Lungs: CTAB, did not appreciate any wheezes, rales, or rhonchi Abdomen: + BS; soft, ND, NT Vascular: 2+ pedal pulses b/l; 2+ radial pulses b/l Extremities: no peripheral edema, no muscle wasting Neuro: strength and sensation intact in upper and lower extremities b/l ***   *** Neuro: 5/5 strength of the proximal and distal flexors and extensors of the upper and lower extremities bilaterally; sensation intact in upper and lower extremities b/l; cranial nerves II through XII grossly intact; no pronator drift; no evidence suggestive of slurred speech, dysarthria, or facial droop; Normal muscle tone. No tremors.  *** Neuro: In the setting of the patient's current mental status and associated inability to follow instructions, unable to perform full neurologic exam at this time.  As such, assessment of strength, sensation, and cranial nerves is limited at this time. Patient noted to spontaneously move all 4 extremities. No tremors.  ***    Labs on Admission: I have personally reviewed following labs and imaging studies  CBC: Recent Labs  Lab 01/11/24 2041 01/11/24 2058 01/15/24 1651 01/15/24 1707  WBC 12.0*  --  10.5  --   NEUTROABS 5.8  --  5.1  --   HGB 9.0* 10.2* 10.9* 11.2*  HCT 30.1* 30.0* 37.5 33.0*  MCV 91.8  --  93.8  --   PLT 276  --  278  --    Basic Metabolic Panel: Recent Labs  Lab 01/11/24 2041 01/11/24 2058 01/15/24 1651 01/15/24 1707  NA 140 140 142 138  K 3.8 3.7 5.7* 5.5*  CL 106 106 103 107  CO2 20*  --  18*  --   GLUCOSE 107* 99 172* 177*  BUN 37* 35* 38* 36*  CREATININE 7.49* 8.30* 7.32* 7.90*  CALCIUM  8.0*  --  8.7*  --    GFR: Estimated Creatinine Clearance: 9.3 mL/min (A) (by C-G formula based on SCr of 7.9 mg/dL  (H)). Liver Function Tests: Recent Labs  Lab 01/11/24 2041 01/15/24 1651  AST 53* 77*  ALT 31 43  ALKPHOS 400* 557*  BILITOT 0.7 1.0  PROT 4.9* 5.9*  ALBUMIN  2.5* 3.1*  Recent Labs  Lab 01/15/24 1651  LIPASE 24   No results for input(s): AMMONIA in the last 168 hours. Coagulation Profile: No results for input(s): INR, PROTIME in the last 168 hours. Cardiac Enzymes: No results for input(s): CKTOTAL, CKMB, CKMBINDEX, TROPONINI in the last 168 hours. BNP (last 3 results) No results for input(s): PROBNP in the last 8760 hours. HbA1C: No results for input(s): HGBA1C in the last 72 hours. CBG: Recent Labs  Lab 01/11/24 1842 01/12/24 0114 01/12/24 0203 01/12/24 0343  GLUCAP 122* 42* 174* 119*   Lipid Profile: No results for input(s): CHOL, HDL, LDLCALC, TRIG, CHOLHDL, LDLDIRECT in the last 72 hours. Thyroid  Function Tests: No results for input(s): TSH, T4TOTAL, FREET4, T3FREE, THYROIDAB in the last 72 hours. Anemia Panel: No results for input(s): VITAMINB12, FOLATE, FERRITIN, TIBC, IRON , RETICCTPCT in the last 72 hours. Urine analysis:    Component Value Date/Time   COLORURINE YELLOW 10/25/2023 2300   APPEARANCEUR CLOUDY (A) 10/25/2023 2300   APPEARANCEUR Hazy 11/10/2013 2043   LABSPEC 1.016 10/25/2023 2300   LABSPEC 1.031 11/10/2013 2043   PHURINE 6.0 10/25/2023 2300   GLUCOSEU >=500 (A) 10/25/2023 2300   GLUCOSEU >=500 11/10/2013 2043   HGBUR SMALL (A) 10/25/2023 2300   BILIRUBINUR NEGATIVE 10/25/2023 2300   BILIRUBINUR negative 06/04/2018 1035   BILIRUBINUR Negative 11/24/2015 1443   BILIRUBINUR Negative 11/10/2013 2043   KETONESUR 5 (A) 10/25/2023 2300   PROTEINUR >=300 (A) 10/25/2023 2300   UROBILINOGEN 0.2 09/14/2019 1732   NITRITE NEGATIVE 10/25/2023 2300   LEUKOCYTESUR NEGATIVE 10/25/2023 2300   LEUKOCYTESUR 1+ 11/10/2013 2043    Radiological Exams on Admission: CT ABDOMEN PELVIS WO  CONTRAST Result Date: 01/15/2024 CLINICAL DATA:  Abdominal pain, acute, nonlocalized generalized pain and missed dialysis. Dialysis days are MWF- states had a family emergency today. EXAM: CT ABDOMEN AND PELVIS WITHOUT CONTRAST TECHNIQUE: Multidetector CT imaging of the abdomen and pelvis was performed following the standard protocol without IV contrast. RADIATION DOSE REDUCTION: This exam was performed according to the departmental dose-optimization program which includes automated exposure control, adjustment of the mA and/or kV according to patient size and/or use of iterative reconstruction technique. COMPARISON:  CT abdomen pelvis 01/04/2024 FINDINGS: Lower chest: Interval increase in trace right pleural effusion. Bilateral lower lobe atelectasis. Enlarged cardiac silhouette. Cardiac findings consistent with anemia. Hepatobiliary: The liver is enlarged measuring up to 21 cm. No focal liver abnormality. Status post cholecystectomy. No biliary dilatation. Pancreas: No focal lesion. Normal pancreatic contour. No surrounding inflammatory changes. No main pancreatic ductal dilatation. Spleen: Normal in size without focal abnormality. Adrenals/Urinary Tract: No adrenal nodule bilaterally. No nephrolithiasis and no hydronephrosis. No definite contour-deforming renal mass. No ureterolithiasis or hydroureter. The urinary bladder is unremarkable. Stomach/Bowel: Stomach is within normal limits. No evidence of bowel wall thickening or dilatation. Appendix appears normal. Vascular/Lymphatic: No abdominal aorta or iliac aneurysm. Mild atherosclerotic plaque of the aorta and its branches. Similar-appearing multiple prominent retroperitoneal and pelvic lymph nodes. No abdominal, pelvic, or inguinal lymphadenopathy. Reproductive: Uterus and bilateral adnexa are unremarkable. Other: Moderate simple free fluid. No intraperitoneal free gas. No organized fluid collection. Musculoskeletal: No abdominal wall hernia or abnormality.  Diffuse subcutaneus soft tissue edema. No suspicious lytic or blastic osseous lesions. No acute displaced fracture. IMPRESSION: 1. Findings suggestive of volume overload. 2. Interval increase in trace right pleural effusion. 3. Moderate volume simple ascites. 4. Diffuse subcutaneus soft tissue edema. 5. Similar-appearing multiple prominent retroperitoneal and pelvic lymph nodes. 6. Hepatomegaly. 7. Cardiomegaly. Electronically Signed  By: Morgane  Naveau M.D.   On: 01/15/2024 18:49   DG Chest 2 View Result Date: 01/15/2024 CLINICAL DATA:  shob EXAM: CHEST - 2 VIEW COMPARISON:  Chest x-ray 01/11/2024, CT chest 07/15/2023 FINDINGS: The heart and mediastinal contours are unchanged. Low lung volumes. Persistent mild interstitial airspace opacities. No pleural effusion. No pneumothorax. No acute osseous abnormality. IMPRESSION: Low lung volumes.  Persistent mild interstitial airspace opacities. Electronically Signed   By: Morgane  Naveau M.D.   On: 01/15/2024 18:15      Assessment/Plan   Active Problems:   Hyperkalemia   ***            ***                  ***                   ***                  ***                  ***                  ***                   ***                  ***                  ***                  ***                  ***                 ***                ***  DVT prophylaxis: SCD's ***  Code Status: Full code*** Family Communication: none*** Disposition Plan: Per Rounding Team Consults called: EDP discussed with on-call nephrology, Dr. Windle, who conveyed that nephrology will formally consult and are planning for HD in the AM (9/18).;  Admission status: ***     I SPENT GREATER THAN 75 *** MINUTES IN CLINICAL CARE TIME/MEDICAL DECISION-MAKING  IN COMPLETING THIS ADMISSION.      Eva NOVAK Desirea Mizrahi DO [facility]  From 7PM - 7AM   01/15/2024, 7:38 PM   ***

## 2024-01-15 NOTE — ED Notes (Signed)
 Pt requesting to be discharged. States she spoke to her dialysis nurse who told her to come in at 0600 tomorrow for dialysis. Pt stating her HA has improved and she feels better at this time. Provider made aware.

## 2024-01-15 NOTE — ED Notes (Signed)
 Admitting  at bedside.

## 2024-01-15 NOTE — ED Triage Notes (Signed)
 Pt BIBA from home for c/o generalized pain and missed dialysis. Dialysis days are MWF- states had a family emergency today. Last dialysis day was Sunday. Pt reports HA and mild SOB. Denies CP, n/v/d, fever or chills.

## 2024-01-16 DIAGNOSIS — E875 Hyperkalemia: Secondary | ICD-10-CM | POA: Diagnosis not present

## 2024-01-16 DIAGNOSIS — J309 Allergic rhinitis, unspecified: Secondary | ICD-10-CM | POA: Diagnosis present

## 2024-01-16 DIAGNOSIS — N189 Chronic kidney disease, unspecified: Secondary | ICD-10-CM

## 2024-01-16 DIAGNOSIS — E8729 Other acidosis: Secondary | ICD-10-CM | POA: Diagnosis present

## 2024-01-16 LAB — COMPREHENSIVE METABOLIC PANEL WITH GFR
ALT: 45 U/L — ABNORMAL HIGH (ref 0–44)
AST: 92 U/L — ABNORMAL HIGH (ref 15–41)
Albumin: 2.9 g/dL — ABNORMAL LOW (ref 3.5–5.0)
Alkaline Phosphatase: 516 U/L — ABNORMAL HIGH (ref 38–126)
Anion gap: 20 — ABNORMAL HIGH (ref 5–15)
BUN: 43 mg/dL — ABNORMAL HIGH (ref 6–20)
CO2: 20 mmol/L — ABNORMAL LOW (ref 22–32)
Calcium: 8.5 mg/dL — ABNORMAL LOW (ref 8.9–10.3)
Chloride: 99 mmol/L (ref 98–111)
Creatinine, Ser: 7.8 mg/dL — ABNORMAL HIGH (ref 0.44–1.00)
GFR, Estimated: 7 mL/min — ABNORMAL LOW (ref >60)
Glucose, Bld: 191 mg/dL — ABNORMAL HIGH (ref 70–99)
Potassium: 5.6 mmol/L — ABNORMAL HIGH (ref 3.5–5.1)
Sodium: 139 mmol/L (ref 135–145)
Total Bilirubin: 1.3 mg/dL — ABNORMAL HIGH (ref 0.0–1.2)
Total Protein: 5.1 g/dL — ABNORMAL LOW (ref 6.5–8.1)

## 2024-01-16 LAB — GLUCOSE, CAPILLARY
Glucose-Capillary: 126 mg/dL — ABNORMAL HIGH (ref 70–99)
Glucose-Capillary: 220 mg/dL — ABNORMAL HIGH (ref 70–99)

## 2024-01-16 LAB — BASIC METABOLIC PANEL WITH GFR
Anion gap: 19 — ABNORMAL HIGH (ref 5–15)
BUN: 40 mg/dL — ABNORMAL HIGH (ref 6–20)
CO2: 19 mmol/L — ABNORMAL LOW (ref 22–32)
Calcium: 8.5 mg/dL — ABNORMAL LOW (ref 8.9–10.3)
Chloride: 104 mmol/L (ref 98–111)
Creatinine, Ser: 7.55 mg/dL — ABNORMAL HIGH (ref 0.44–1.00)
GFR, Estimated: 7 mL/min — ABNORMAL LOW (ref >60)
Glucose, Bld: 126 mg/dL — ABNORMAL HIGH (ref 70–99)
Potassium: 5.5 mmol/L — ABNORMAL HIGH (ref 3.5–5.1)
Sodium: 142 mmol/L (ref 135–145)

## 2024-01-16 LAB — CBC WITH DIFFERENTIAL/PLATELET
Abs Immature Granulocytes: 0.05 K/uL (ref 0.00–0.07)
Basophils Absolute: 0.1 K/uL (ref 0.0–0.1)
Basophils Relative: 1 %
Eosinophils Absolute: 0.9 K/uL — ABNORMAL HIGH (ref 0.0–0.5)
Eosinophils Relative: 8 %
HCT: 31.9 % — ABNORMAL LOW (ref 36.0–46.0)
Hemoglobin: 9.4 g/dL — ABNORMAL LOW (ref 12.0–15.0)
Immature Granulocytes: 1 %
Lymphocytes Relative: 36 %
Lymphs Abs: 3.8 K/uL (ref 0.7–4.0)
MCH: 27.2 pg (ref 26.0–34.0)
MCHC: 29.5 g/dL — ABNORMAL LOW (ref 30.0–36.0)
MCV: 92.2 fL (ref 80.0–100.0)
Monocytes Absolute: 0.6 K/uL (ref 0.1–1.0)
Monocytes Relative: 6 %
Neutro Abs: 5.1 K/uL (ref 1.7–7.7)
Neutrophils Relative %: 48 %
Platelets: 245 K/uL (ref 150–400)
RBC: 3.46 MIL/uL — ABNORMAL LOW (ref 3.87–5.11)
RDW: 15.9 % — ABNORMAL HIGH (ref 11.5–15.5)
WBC: 10.5 K/uL (ref 4.0–10.5)
nRBC: 0 % (ref 0.0–0.2)

## 2024-01-16 LAB — MAGNESIUM: Magnesium: 2.7 mg/dL — ABNORMAL HIGH (ref 1.7–2.4)

## 2024-01-16 LAB — MRSA NEXT GEN BY PCR, NASAL: MRSA by PCR Next Gen: NOT DETECTED

## 2024-01-16 LAB — BRAIN NATRIURETIC PEPTIDE: B Natriuretic Peptide: 4044 pg/mL — ABNORMAL HIGH (ref 0.0–100.0)

## 2024-01-16 LAB — CBG MONITORING, ED: Glucose-Capillary: 177 mg/dL — ABNORMAL HIGH (ref 70–99)

## 2024-01-16 LAB — PHOSPHORUS: Phosphorus: 8.5 mg/dL — ABNORMAL HIGH (ref 2.5–4.6)

## 2024-01-16 MED ORDER — HEPARIN SODIUM (PORCINE) 1000 UNIT/ML DIALYSIS
2000.0000 [IU] | Freq: Once | INTRAMUSCULAR | Status: AC
  Administered 2024-01-16: 2000 [IU] via INTRAVENOUS_CENTRAL

## 2024-01-16 MED ORDER — ANTICOAGULANT SODIUM CITRATE 4% (200MG/5ML) IV SOLN: 5.0000 mL | Status: DC | PRN

## 2024-01-16 MED ORDER — PANTOPRAZOLE SODIUM 40 MG PO TBEC
40.0000 mg | DELAYED_RELEASE_TABLET | Freq: Every day | ORAL | Status: DC
  Administered 2024-01-17: 40 mg via ORAL
  Filled 2024-01-16: qty 1

## 2024-01-16 MED ORDER — NEPRO/CARBSTEADY PO LIQD: 237.0000 mL | ORAL | Status: DC | PRN

## 2024-01-16 MED ORDER — INSULIN GLARGINE 100 UNIT/ML ~~LOC~~ SOLN
5.0000 [IU] | Freq: Every day | SUBCUTANEOUS | Status: DC
  Administered 2024-01-16: 5 [IU] via SUBCUTANEOUS
  Filled 2024-01-16 (×2): qty 0.05

## 2024-01-16 MED ORDER — FLUTICASONE PROPIONATE 50 MCG/ACT NA SUSP
2.0000 | Freq: Every day | NASAL | Status: DC
  Administered 2024-01-17: 2 via NASAL
  Filled 2024-01-16: qty 16

## 2024-01-16 MED ORDER — LIDOCAINE-PRILOCAINE 2.5-2.5 % EX CREA: 1.0000 | TOPICAL_CREAM | CUTANEOUS | Status: DC | PRN

## 2024-01-16 MED ORDER — CHLORHEXIDINE GLUCONATE CLOTH 2 % EX PADS: 6.0000 | MEDICATED_PAD | Freq: Every day | CUTANEOUS | Status: DC

## 2024-01-16 MED ORDER — HEPARIN SODIUM (PORCINE) 1000 UNIT/ML DIALYSIS: 1000.0000 [IU] | INTRAMUSCULAR | Status: DC | PRN

## 2024-01-16 MED ORDER — SODIUM BICARBONATE 650 MG PO TABS
650.0000 mg | ORAL_TABLET | Freq: Two times a day (BID) | ORAL | Status: DC
  Administered 2024-01-16 – 2024-01-17 (×2): 650 mg via ORAL
  Filled 2024-01-16 (×2): qty 1

## 2024-01-16 MED ORDER — PENTAFLUOROPROP-TETRAFLUOROETH EX AERO: 1.0000 | INHALATION_SPRAY | CUTANEOUS | Status: DC | PRN

## 2024-01-16 MED ORDER — HEPARIN SODIUM (PORCINE) 1000 UNIT/ML DIALYSIS: 1500.0000 [IU] | INTRAMUSCULAR | Status: DC | PRN

## 2024-01-16 MED ORDER — ALTEPLASE 2 MG IJ SOLR: 2.0000 mg | Freq: Once | INTRAMUSCULAR | Status: DC | PRN

## 2024-01-16 MED ORDER — OLANZAPINE 5 MG PO TBDP
5.0000 mg | ORAL_TABLET | Freq: Every day | ORAL | Status: DC
  Administered 2024-01-16: 5 mg via ORAL
  Filled 2024-01-16 (×2): qty 1

## 2024-01-16 MED ORDER — MORPHINE SULFATE (PF) 2 MG/ML IV SOLN
INTRAVENOUS | Status: AC
  Filled 2024-01-16: qty 2

## 2024-01-16 MED ORDER — LAMOTRIGINE 100 MG PO TABS
200.0000 mg | ORAL_TABLET | Freq: Every day | ORAL | Status: DC
  Administered 2024-01-16 – 2024-01-17 (×2): 200 mg via ORAL
  Filled 2024-01-16: qty 2
  Filled 2024-01-16: qty 8

## 2024-01-16 MED ORDER — LIDOCAINE HCL (PF) 1 % IJ SOLN: 5.0000 mL | INTRAMUSCULAR | Status: DC | PRN

## 2024-01-16 NOTE — Progress Notes (Signed)
 Pt receives out-pt HD at [facility] on MWF 2nd shift. Will assist as needed.   Randine Mungo Dialysis Navigator [phone]

## 2024-01-16 NOTE — Progress Notes (Signed)
 PROGRESS NOTE    Ashley Freeman  FMW:[id number] DOB: March 24, 1991 DOA: 01/15/2024 PCP: Keven Crumbly Pap, MD    Brief Narrative:  Ashley Freeman is a 33 y.o. female with medical history significant for end-stage renal disease on hemodialysis on Monday, Wednesday, Friday, type 1 diabetes mellitus, chronic systolic heart failure, who is admitted to [facility] on 01/15/2024 with hyperkalemia after presenting from home to [facility] ED complaining of abdominal pain.     Assessment and Plan: Hyperkalemia:  -Needs HD     Generalized abdominal discomfort: Recurrence of her generalized abdominal discomfort, which she reports occurs in the setting of mild volume overload after missing hemodialysis sessions, with today CT abdomen/pelvis showing evidence of acute addition of moderate ascites -No evidence of acute surgical abdomen upon physical exam.   - Getting HD     ESRD:  -On hemodialysis, on Monday, Friday Wednesday, Friday schedule, with patient having missed each of her last hemodialysis sessions, with most recent complete session occurring on Friday, 01/10/2024.  -Renal consultation, getting HD on 9/18       Type1 Diabetes Mellitus: -Lantus /sliding scale insulin       Chronic systolic/diastolic heart failure:  -Volume controlled by hemodialysis     Bipolar disorder: -: Continue outpatient Lamictal  and olanzapine .       Anemia of chronic kidney disease:  - Trend hemoglobin     DVT prophylaxis: SCDs Start: 01/15/24 1935    Code Status: Full Code   Disposition Plan:  Level of care: Progressive Status is: Observation     Consultants:  Nephrology   Subjective: In HD  Objective: Vitals:   01/16/24 1230 01/16/24 1300 01/16/24 1330 01/16/24 1400  BP: (!) 147/95 (!) 150/71 (!) 156/88 (!) 169/94  Pulse: 89 94 76 90  Resp: 20  18 20   Temp:      TempSrc:      SpO2: 96% 97% 99% 100%  Weight:      Height:       No intake or output data in  the 24 hours ending 01/16/24 1416 Filed Weights   01/15/24 1620 01/16/24 1044  Weight: 64.3 kg 64.3 kg    Examination:   General: Appearance:     Overweight female in no acute distress     Lungs:      respirations unlabored  Heart:    Normal heart rate   MS:   All extremities are intact.    Neurologic:   Awake, alert       Data Reviewed: I have personally reviewed following labs and imaging studies  CBC: Recent Labs  Lab 01/11/24 2041 01/11/24 2058 01/15/24 1651 01/15/24 1707 01/16/24 0608  WBC 12.0*  --  10.5  --  10.5  NEUTROABS 5.8  --  5.1  --  5.1  HGB 9.0* 10.2* 10.9* 11.2* 9.4*  HCT 30.1* 30.0* 37.5 33.0* 31.9*  MCV 91.8  --  93.8  --  92.2  PLT 276  --  278  --  245   Basic Metabolic Panel: Recent Labs  Lab 01/11/24 2041 01/11/24 2058 01/15/24 1651 01/15/24 1707 01/16/24 0100 01/16/24 0608  NA 140 140 142 138 142 139  K 3.8 3.7 5.7* 5.5* 5.5* 5.6*  CL 106 106 103 107 104 99  CO2 20*  --  18*  --  19* 20*  GLUCOSE 107* 99 172* 177* 126* 191*  BUN 37* 35* 38* 36* 40* 43*  CREATININE 7.49* 8.30* 7.32* 7.90* 7.55* 7.80*  CALCIUM  8.0*  --  8.7*  --  8.5* 8.5*  MG  --   --   --   --   --  2.7*  PHOS  --   --   --   --   --  8.5*   GFR: Estimated Creatinine Clearance: 9.4 mL/min (A) (by C-G formula based on SCr of 7.8 mg/dL (H)). Liver Function Tests: Recent Labs  Lab 01/11/24 2041 01/15/24 1651 01/16/24 0608  AST 53* 77* 92*  ALT 31 43 45*  ALKPHOS 400* 557* 516*  BILITOT 0.7 1.0 1.3*  PROT 4.9* 5.9* 5.1*  ALBUMIN  2.5* 3.1* 2.9*   Recent Labs  Lab 01/15/24 1651  LIPASE 24   No results for input(s): AMMONIA in the last 168 hours. Coagulation Profile: No results for input(s): INR, PROTIME in the last 168 hours. Cardiac Enzymes: No results for input(s): CKTOTAL, CKMB, CKMBINDEX, TROPONINI in the last 168 hours. BNP (last 3 results) No results for input(s): PROBNP in the last 8760 hours. HbA1C: No results for  input(s): HGBA1C in the last 72 hours. CBG: Recent Labs  Lab 01/12/24 0203 01/12/24 0343 01/15/24 1949 01/15/24 2154 01/16/24 0803  GLUCAP 174* 119* 177* 89 177*   Lipid Profile: No results for input(s): CHOL, HDL, LDLCALC, TRIG, CHOLHDL, LDLDIRECT in the last 72 hours. Thyroid  Function Tests: No results for input(s): TSH, T4TOTAL, FREET4, T3FREE, THYROIDAB in the last 72 hours. Anemia Panel: No results for input(s): VITAMINB12, FOLATE, FERRITIN, TIBC, IRON , RETICCTPCT in the last 72 hours. Sepsis Labs: No results for input(s): PROCALCITON, LATICACIDVEN in the last 168 hours.  Recent Results (from the past 240 hours)  Resp panel by RT-PCR (RSV, Flu A&B, Covid) Anterior Nasal Swab     Status: None   Collection Time: 01/15/24  5:12 PM   Specimen: Anterior Nasal Swab  Result Value Ref Range Status   SARS Coronavirus 2 by RT PCR NEGATIVE NEGATIVE Final   Influenza A by PCR NEGATIVE NEGATIVE Final   Influenza B by PCR NEGATIVE NEGATIVE Final    Comment: (NOTE) The Xpert Xpress SARS-CoV-2/FLU/RSV plus assay is intended as an aid in the diagnosis of influenza from Nasopharyngeal swab specimens and should not be used as a sole basis for treatment. Nasal washings and aspirates are unacceptable for Xpert Xpress SARS-CoV-2/FLU/RSV testing.  Fact Sheet for Patients: BloggerCourse.com  Fact Sheet for Healthcare Providers: SeriousBroker.it  This test is not yet approved or cleared by the [place]  FDA and has been authorized for detection and/or diagnosis of SARS-CoV-2 by FDA under an Emergency Use Authorization (EUA). This EUA will remain in effect (meaning this test can be used) for the duration of the COVID-19 declaration under Section 564(b)(1) of the Act, 21 U.S.C. section 360bbb-3(b)(1), unless the authorization is terminated or revoked.     Resp Syncytial Virus by PCR NEGATIVE  NEGATIVE Final    Comment: (NOTE) Fact Sheet for Patients: BloggerCourse.com  Fact Sheet for Healthcare Providers: SeriousBroker.it  This test is not yet approved or cleared by the [place]  FDA and has been authorized for detection and/or diagnosis of SARS-CoV-2 by FDA under an Emergency Use Authorization (EUA). This EUA will remain in effect (meaning this test can be used) for the duration of the COVID-19 declaration under Section 564(b)(1) of the Act, 21 U.S.C. section 360bbb-3(b)(1), unless the authorization is terminated or revoked.  Performed at [facility], [address] [place], [place] [address]          Radiology Studies: CT ABDOMEN PELVIS WO CONTRAST  Result Date: 01/15/2024 CLINICAL DATA:  Abdominal pain, acute, nonlocalized generalized pain and missed dialysis. Dialysis days are MWF- states had a family emergency today. EXAM: CT ABDOMEN AND PELVIS WITHOUT CONTRAST TECHNIQUE: Multidetector CT imaging of the abdomen and pelvis was performed following the standard protocol without IV contrast. RADIATION DOSE REDUCTION: This exam was performed according to the departmental dose-optimization program which includes automated exposure control, adjustment of the mA and/or kV according to patient size and/or use of iterative reconstruction technique. COMPARISON:  CT abdomen pelvis 01/04/2024 FINDINGS: Lower chest: Interval increase in trace right pleural effusion. Bilateral lower lobe atelectasis. Enlarged cardiac silhouette. Cardiac findings consistent with anemia. Hepatobiliary: The liver is enlarged measuring up to 21 cm. No focal liver abnormality. Status post cholecystectomy. No biliary dilatation. Pancreas: No focal lesion. Normal pancreatic contour. No surrounding inflammatory changes. No main pancreatic ductal dilatation. Spleen: Normal in size without focal abnormality. Adrenals/Urinary Tract: No adrenal nodule  bilaterally. No nephrolithiasis and no hydronephrosis. No definite contour-deforming renal mass. No ureterolithiasis or hydroureter. The urinary bladder is unremarkable. Stomach/Bowel: Stomach is within normal limits. No evidence of bowel wall thickening or dilatation. Appendix appears normal. Vascular/Lymphatic: No abdominal aorta or iliac aneurysm. Mild atherosclerotic plaque of the aorta and its branches. Similar-appearing multiple prominent retroperitoneal and pelvic lymph nodes. No abdominal, pelvic, or inguinal lymphadenopathy. Reproductive: Uterus and bilateral adnexa are unremarkable. Other: Moderate simple free fluid. No intraperitoneal free gas. No organized fluid collection. Musculoskeletal: No abdominal wall hernia or abnormality. Diffuse subcutaneus soft tissue edema. No suspicious lytic or blastic osseous lesions. No acute displaced fracture. IMPRESSION: 1. Findings suggestive of volume overload. 2. Interval increase in trace right pleural effusion. 3. Moderate volume simple ascites. 4. Diffuse subcutaneus soft tissue edema. 5. Similar-appearing multiple prominent retroperitoneal and pelvic lymph nodes. 6. Hepatomegaly. 7. Cardiomegaly. Electronically Signed   By: Morgane  Naveau M.D.   On: 01/15/2024 18:49   DG Chest 2 View Result Date: 01/15/2024 CLINICAL DATA:  shob EXAM: CHEST - 2 VIEW COMPARISON:  Chest x-ray 01/11/2024, CT chest 07/15/2023 FINDINGS: The heart and mediastinal contours are unchanged. Low lung volumes. Persistent mild interstitial airspace opacities. No pleural effusion. No pneumothorax. No acute osseous abnormality. IMPRESSION: Low lung volumes.  Persistent mild interstitial airspace opacities. Electronically Signed   By: Morgane  Naveau M.D.   On: 01/15/2024 18:15        Scheduled Meds:  Chlorhexidine  Gluconate Cloth  6 each Topical Q0600   fluticasone   2 spray Each Nare Daily   insulin  aspart  0-5 Units Subcutaneous QHS   insulin  aspart  0-9 Units Subcutaneous TID  WC   insulin  glargine  5 Units Subcutaneous Q2200   lamoTRIgine   200 mg Oral Daily   OLANZapine  zydis  5 mg Oral QHS   pantoprazole   40 mg Oral Daily   sodium bicarbonate   650 mg Oral BID   Continuous Infusions:  anticoagulant sodium citrate        LOS: 0 days    Time spent: 45 minutes spent on chart review, discussion with nursing staff, consultants, updating family and interview/physical exam; more than 50% of that time was spent in counseling and/or coordination of care.    Magin Balbi U Aniesa Boback, DO [facility] Available via Epic secure chat 7am-7pm After these hours, please refer to coverage provider listed on [email or web address] 01/16/2024, 2:16 PM

## 2024-01-16 NOTE — Procedures (Signed)
 S: pt seen and examined in HD unit. No c/o's, tolerating well so far.   Vitals:   01/16/24 1330 01/16/24 1400 01/16/24 1430 01/16/24 1440  BP: (!) 156/88 (!) 169/94 (!) 160/93 (!) 164/94  Pulse: 76 90 90 90  Resp: 18 20 17    Temp:      TempSrc:      SpO2: 99% 100% 93% 98%  Weight:      Height:        Recent Labs  Lab 01/15/24 1651 01/15/24 1707 01/16/24 0100 01/16/24 0608  HGB 10.9* 11.2*  --  9.4*  ALBUMIN  3.1*  --   --  2.9*  CALCIUM  8.7*  --  8.5* 8.5*  PHOS  --   --   --  8.5*  CREATININE 7.32* 7.90* 7.55* 7.80*  K 5.7* 5.5* 5.5* 5.6*    Inpatient medications:  Chlorhexidine  Gluconate Cloth  6 each Topical Q0600   fluticasone   2 spray Each Nare Daily   insulin  aspart  0-5 Units Subcutaneous QHS   insulin  aspart  0-9 Units Subcutaneous TID WC   insulin  glargine  5 Units Subcutaneous Q2200   lamoTRIgine   200 mg Oral Daily   OLANZapine  zydis  5 mg Oral QHS   pantoprazole   40 mg Oral Daily   sodium bicarbonate   650 mg Oral BID    anticoagulant sodium citrate      acetaminophen  **OR** acetaminophen , alteplase , anticoagulant sodium citrate , feeding supplement (NEPRO CARB STEADY), heparin , heparin , hydrOXYzine , lidocaine  (PF), lidocaine -prilocaine , melatonin, morphine  injection, naLOXone  (NARCAN )  injection, ondansetron  (ZOFRAN ) IV, pentafluoroprop-tetrafluoroeth  I was present at the procedure, reviewed the HD regimen and made appropriate changes.   Myer Fret MD  [facility] 01/16/2024, 2:41 PM

## 2024-01-16 NOTE — ED Notes (Signed)
 Pt sleeping comfortably, equal chest rise and fall. Easily arousable to voice. Pt remains on continuous cardiac monitoring.

## 2024-01-16 NOTE — Consult Note (Signed)
 Renal Servie Consult Note [facility] Ashley JONETTA Fret, MD  Patient: Ashley Freeman Date: 01/16/2024 Requesting Physician: Dr. Juvenal  Reason for Consult: ESRD pt w/ abd pain  HPI: The patient is a 33 y.o. year-old w/ PMH as below who presented to ED on 9/17 complaining of generalized pain, abdominal pain and missed dialysis.  Usual dialysis days are MWF.  Also mild shortness of breath.  ED patient was afebrile, HR 80, blood pressure 150/80, RR 15-20, 100% sats on room air.  Labs showed K+ 5.7, albumin  3.1, creatinine 7.3, calcium  8.7.  Patient was admitted for hyperkalemia and abdominal pain.  Nephrology was contacted for dialysis in the morning.  We are asked to see for ESRD.   Pt seen in HD unit.  Main complaint recently has been abdominal pain.  Mild shortness of breath, which is better half way into dialysis.  Chest x-ray showed mild pulmonary edema.  Patient goes to dialysis in [facility] on [address].  She uses a AV graft in her arm, on dialysis for 3 years.  Takes a Medicare bus to dialysis and lives with family at home.   ROS - denies CP, no joint pain, no HA, no blurry vision, no rash, no diarrhea, no nausea/ vomiting   Past Medical History  Past Medical History:  Diagnosis Date   Abscess, gluteal, right 08/24/2013   Anemia 02/19/2012   Bartholin's gland abscess 09/19/2013   Bipolar disorder (HCC)    BV (bacterial vaginosis) 11/24/2015   Depression    Diabetes mellitus type I (HCC) 2001   Diagnosed at age 8 ; Type I   Diarrhea 05/30/2016   DKA (diabetic ketoacidoses) 08/19/2013   Also in 2018   ESRD (end stage renal disease) (HCC)    Gonorrhea 08/2011   Treated in 09/2011   HFrEF (heart failure with reduced ejection fraction) (HCC)    a. 2022 Echo: EF 40%; b. 10/2021 Echo: EF 55%; b. 07/2022 MV: No ischemia. EF 31%; c. 08/2022 Echo: EF 35%, mildly dil RV, sev TR.   History of trichomoniasis 05/31/2016   Hyperlipidemia 03/28/2016    Hypertension    NICM (nonischemic cardiomyopathy) (HCC)    Sepsis (HCC) 09/19/2013   Past Surgical History  Past Surgical History:  Procedure Laterality Date   A/V FISTULAGRAM Right 06/17/2023   Procedure: A/V Fistulagram;  Surgeon: Marea Selinda GORMAN, MD;  Location: [facility] INVASIVE CV LAB;  Service: Cardiovascular;  Laterality: Right;   A/V SHUNT INTERVENTION N/A 09/25/2023   Procedure: A/V SHUNT INTERVENTION;  Surgeon: Pearline Norman GORMAN, MD;  Location: [facility] PV LAB;  Service: Cardiovascular;  Laterality: N/A;   AV FISTULA PLACEMENT Right 07/06/2022   Procedure: ARTERIOVENOUS GRAFT CREATION;  Surgeon: Gretta Lonni PARAS, MD;  Location: [facility] OR;  Service: Vascular;  Laterality: Right;   CESAREAN SECTION N/A 10/05/2019   Procedure: CESAREAN SECTION;  Surgeon: Izell Harari, MD;  Location: [facility] LD ORS;  Service: Obstetrics;  Laterality: N/A;   CHOLECYSTECTOMY N/A 07/02/2023   Procedure: LAPAROSCOPIC CHOLECYSTECTOMY;  Surgeon: Ebbie Cough, MD;  Location: [facility] OR;  Service: General;  Laterality: N/A;   INCISION AND DRAINAGE ABSCESS Left 09/28/2019   Procedure: INCISION AND DRAINAGE VULVAR ABCESS;  Surgeon: Edsel Norleen GAILS, MD;  Location: [facility] OR;  Service: Gynecology;  Laterality: Left;   INCISION AND DRAINAGE PERIRECTAL ABSCESS Right 08/18/2013   Procedure: IRRIGATION AND DEBRIDEMENT GLUTEAL ABSCESS;  Surgeon: Lynda Leos, MD;  Location: [facility] OR;  Service: General;  Laterality: Right;   INCISION  AND DRAINAGE PERIRECTAL ABSCESS Right 09/19/2013   Procedure: IRRIGATION AND DEBRIDEMENT RIGHT GLUTEAL AND LABIAL ABSCESSES;  Surgeon: Lynda Leos, MD;  Location: [facility] OR;  Service: General;  Laterality: Right;   INCISION AND DRAINAGE PERIRECTAL ABSCESS Right 09/24/2013   Procedure: IRRIGATION AND DEBRIDEMENT PERIRECTAL ABSCESS;  Surgeon: Lynwood MALVA Pina, MD;  Location: [facility] OR;  Service: General;  Laterality: Right;   IR PARACENTESIS  08/28/2023   IR PARACENTESIS  11/04/2023   IR PARACENTESIS  12/23/2023    Family History  Family History  Problem Relation Age of Onset   Asthma Mother    Carpal tunnel syndrome Mother    Gout Father    Diabetes Paternal Grandmother    Anesthesia problems Neg Hx    Social History  reports that she has never smoked. She has never been exposed to tobacco smoke. She has never used smokeless tobacco. She reports that she does not currently use alcohol. She reports that she does not use drugs. Allergies  Allergies  Allergen Reactions   Keflex  [Cephalexin ] Anaphylaxis    Ceftriaxone  in the past with no reaction   Penicillins Anaphylaxis, Hives and Rash   Vibramycin  [Doxycycline ] Anaphylaxis   Benadryl  [Diphenhydramine ] Itching   Dilaudid  [Hydromorphone ] Itching   Methotrexate Derivatives Rash   Roxicodone  [Oxycodone ] Itching   Home medications Prior to Admission medications   Medication Sig Start Date End Date Taking? Authorizing Provider  albuterol  (VENTOLIN  HFA) 108 (90 Base) MCG/ACT inhaler Inhale 2 puffs into the lungs every 4 (four) hours as needed for wheezing or shortness of breath. 11/24/22   [provider]  bumetanide  (BUMEX ) 2 MG tablet Take 10 mg by mouth daily.    [provider]  calcitRIOL  (ROCALTROL ) 0.25 MCG capsule Take 5 capsules (1.25 mcg total) by mouth every Tuesday, Thursday, and Saturday at 6 PM. Patient taking differently: Take 1.25 mcg by mouth every Monday, Wednesday, and Friday. 07/09/23   Cindy Garnette POUR, MD  carvedilol  (COREG ) 25 MG tablet Take 25 mg by mouth 2 (two) times daily with a meal.    [provider]  DULoxetine  (CYMBALTA ) 20 MG capsule Take 40 mg by mouth daily. 07/29/23   [provider]  famotidine  (PEPCID ) 20 MG tablet Take 20 mg by mouth daily. 07/29/23   [provider]  fluticasone  (FLONASE ) 50 MCG/ACT nasal spray Place 2 sprays into both nostrils daily. 12/11/23 12/10/24  [provider]  hydrOXYzine  (ATARAX ) 25 MG tablet Take 25 mg by mouth 3 (three) times daily  as needed for anxiety, itching, nausea or vomiting.    [provider]  insulin  aspart (NOVOLOG ) 100 UNIT/ML injection Inject 5 Units into the skin 3 (three) times daily with meals.    [provider]  ketoconazole  (NIZORAL ) 2 % cream Apply 1 Application topically 2 (two) times daily. Apply 1gm twice daily to affected skin on feet 01/06/24   McCaughan, Dia D, DPM  lamoTRIgine  (LAMICTAL ) 200 MG tablet Take 1 tablet (200 mg total) by mouth daily. 10/29/23   Regalado, Belkys A, MD  LANTUS  SOLOSTAR 100 UNIT/ML Solostar Pen Inject 10 Units into the skin daily.    [provider]  OLANZapine  zydis (ZYPREXA ) 5 MG disintegrating tablet Take 5 mg by mouth at bedtime. 09/23/23 02/08/24  [provider]  omeprazole  (PRILOSEC) 40 MG capsule Take 40 mg by mouth daily. 12/11/23 02/09/24  [provider]  ondansetron  (ZOFRAN -ODT) 4 MG disintegrating tablet Take 1 tablet (4 mg total) by mouth every 8 (eight) hours as  needed for nausea or vomiting. 06/23/23   Nicholaus Cassondra DEL, MD  Pancrelipase , Lip-Prot-Amyl, 3000-9500 units CPEP Take 3,000 units of lipase by mouth 3 (three) times daily before meals. 09/23/23 02/08/24  [provider]  prochlorperazine  (COMPAZINE ) 5 MG tablet Take 5 mg by mouth every 6 (six) hours as needed for nausea or vomiting.    [provider]  sacubitril-valsartan (ENTRESTO) 24-26 MG Take 1 tablet by mouth 2 (two) times daily.    [provider]  sodium bicarbonate  650 MG tablet Take 650 mg by mouth 2 (two) times daily. 07/29/23   [provider]  SUMAtriptan  (IMITREX ) 50 MG tablet Take 50 mg by mouth every 2 (two) hours as needed for migraine or headache. 06/20/22   [provider]     Vitals:   01/16/24 0104 01/16/24 0146 01/16/24 0248 01/16/24 0648  BP:  102/74 (!) 127/94 (!) 144/94  Pulse: 88 89 91 87  Resp: 15 16 18 18   Temp:    98.2 F (36.8 C)  TempSrc:    Oral  SpO2: 97% 98% 98% 97%  Weight:       Height:       Exam Gen alert, no distress, on RA Sclera anicteric, throat clear  No jvd or bruits Chest clear bilat to bases RRR no MRG Abd soft ntnd no mass or ascites +bs Ext trace LE edema, no other edema Neuro is alert, Ox 3 , nf    RUA AVG +bruit   Home bp meds: Bumex  10 mg daily Coreg  25 mg twice daily Entresto 24-26 twice daily   OP HD: MWF [facility] [address]  Needs updating --> Edw ~ 63kg , per pt , using AVG, hep 1600 bolus +600/hr    Assessment/ Plan: Hyperkalemia: K+ 5.7, for dialysis this morning.  ESRD: on HD MWF.  Dialysis today off schedule. HTN: bp's normal to slightly high. Resume home meds prn.  Volume: mild pulm edema on CXR, mild edema in legs. Chronic ascites, may get LVP. Get vol down w/ HD today.  Anemia of esrd: Hb 9.5- 12 here, follow. Get records.        Myer Fret  MD [facility] 01/16/2024, 8:07 AM  Recent Labs  Lab 01/15/24 1651 01/15/24 1707 01/16/24 0100 01/16/24 0608  HGB 10.9* 11.2*  --  9.4*  ALBUMIN  3.1*  --   --  2.9*  CALCIUM  8.7*  --  8.5* 8.5*  PHOS  --   --   --  8.5*  CREATININE 7.32* 7.90* 7.55* 7.80*  K 5.7* 5.5* 5.5* 5.6*   Inpatient medications:  fluticasone   2 spray Each Nare Daily   insulin  aspart  0-5 Units Subcutaneous QHS   insulin  aspart  0-9 Units Subcutaneous TID WC   insulin  glargine  5 Units Subcutaneous Q2200   lamoTRIgine   200 mg Oral Daily   OLANZapine  zydis  5 mg Oral QHS   pantoprazole   40 mg Oral Daily   sodium bicarbonate   650 mg Oral BID    acetaminophen  **OR** acetaminophen , hydrOXYzine , melatonin, morphine  injection, naLOXone  (NARCAN )  injection, ondansetron  (ZOFRAN ) IV

## 2024-01-16 NOTE — Progress Notes (Signed)
   01/16/24 1444  Vitals  Temp 97.9 F (36.6 C)  Pulse Rate 88  Resp 18  BP (!) 168/137  SpO2 96 %  O2 Device Room Air  Weight 64.3 kg  Type of Weight Post-Dialysis  Oxygen Therapy  Patient Activity (if Appropriate) In bed  Pulse Oximetry Type Continuous  Oximetry Probe Site Changed No  Post Treatment  Dialyzer Clearance Lightly streaked  Hemodialysis Intake (mL) 0 mL  Liters Processed 77.9  Fluid Removed (mL) 4100 mL  Tolerated HD Treatment Yes  AVG/AVF Arterial Site Held (minutes) 10 minutes  AVG/AVF Venous Site Held (minutes) 10 minutes   Received patient in bed to unit.  Alert and oriented.  Informed consent signed and in chart.   TX duration:3.15  Patient tolerated well.  Transported back to the room  Alert, without acute distress.  Hand-off given to patient's nurse.   Access used: RUAF Access issues: no complications  Total UF removed: 4100 Medication(s) given: none   [facility]

## 2024-01-17 ENCOUNTER — Encounter (HOSPITAL_COMMUNITY): Payer: Self-pay | Admitting: Internal Medicine

## 2024-01-17 DIAGNOSIS — E875 Hyperkalemia: Secondary | ICD-10-CM | POA: Diagnosis not present

## 2024-01-17 LAB — CBC
HCT: 29.9 % — ABNORMAL LOW (ref 36.0–46.0)
Hemoglobin: 9.1 g/dL — ABNORMAL LOW (ref 12.0–15.0)
MCH: 27.4 pg (ref 26.0–34.0)
MCHC: 30.4 g/dL (ref 30.0–36.0)
MCV: 90.1 fL (ref 80.0–100.0)
Platelets: 256 K/uL (ref 150–400)
RBC: 3.32 MIL/uL — ABNORMAL LOW (ref 3.87–5.11)
RDW: 15.6 % — ABNORMAL HIGH (ref 11.5–15.5)
WBC: 12 K/uL — ABNORMAL HIGH (ref 4.0–10.5)
nRBC: 0 % (ref 0.0–0.2)

## 2024-01-17 LAB — BASIC METABOLIC PANEL WITH GFR
Anion gap: 14 (ref 5–15)
BUN: 29 mg/dL — ABNORMAL HIGH (ref 6–20)
CO2: 23 mmol/L (ref 22–32)
Calcium: 8.2 mg/dL — ABNORMAL LOW (ref 8.9–10.3)
Chloride: 99 mmol/L (ref 98–111)
Creatinine, Ser: 5.44 mg/dL — ABNORMAL HIGH (ref 0.44–1.00)
GFR, Estimated: 10 mL/min — ABNORMAL LOW (ref >60)
Glucose, Bld: 221 mg/dL — ABNORMAL HIGH (ref 70–99)
Potassium: 3.9 mmol/L (ref 3.5–5.1)
Sodium: 136 mmol/L (ref 135–145)

## 2024-01-17 LAB — GLUCOSE, CAPILLARY
Glucose-Capillary: 256 mg/dL — ABNORMAL HIGH (ref 70–99)
Glucose-Capillary: 27 mg/dL — CL (ref 70–99)
Glucose-Capillary: 31 mg/dL — CL (ref 70–99)
Glucose-Capillary: 94 mg/dL (ref 70–99)

## 2024-01-17 MED ORDER — INSULIN GLARGINE 100 UNIT/ML ~~LOC~~ SOLN
10.0000 [IU] | Freq: Every day | SUBCUTANEOUS | Status: DC
  Filled 2024-01-17: qty 0.1

## 2024-01-17 NOTE — Progress Notes (Signed)
 Hypoglycemic Event  CBG: 31  Treatment: 8 oz juice/soda and graham crackers with peanut butter  Symptoms: Sweaty, Shaky, and Hungry  Follow-up CBG: Time: 1232  CBG Result: 94  Possible Reasons for Event: Type 1 DM, refusal to eat breakfast/lunch tray  Comments/MD notified: Harlene Bowl, DO and Lamar Fret, MD

## 2024-01-17 NOTE — Progress Notes (Signed)
  [facility] Progress Note   Subjective:  Completed dialysis yesterday with 4.1L UF. Seen in room. No acute complaints this am. Feels like breathing is better. Plans are for discharge today and will resume dialysis at outpatient center tomorrow.   Objective Vitals:   01/17/24 0012 01/17/24 0357 01/17/24 0359 01/17/24 0737  BP: 110/64  105/70   Pulse: 91  82   Resp: 17  12   Temp: 99.2 F (37.3 C)  99 F (37.2 C) 98.9 F (37.2 C)  TempSrc: Oral  Oral Oral  SpO2: 92%  93%   Weight:  63.9 kg    Height:         Additional Objective Labs: Basic Metabolic Panel: Recent Labs  Lab 01/16/24 0100 01/16/24 0608 01/17/24 0309  NA 142 139 136  K 5.5* 5.6* 3.9  CL 104 99 99  CO2 19* 20* 23  GLUCOSE 126* 191* 221*  BUN 40* 43* 29*  CREATININE 7.55* 7.80* 5.44*  CALCIUM  8.5* 8.5* 8.2*  PHOS  --  8.5*  --    CBC: Recent Labs  Lab 01/11/24 2041 01/11/24 2058 01/15/24 1651 01/15/24 1707 01/16/24 0608 01/17/24 0309  WBC 12.0*  --  10.5  --  10.5 12.0*  NEUTROABS 5.8  --  5.1  --  5.1  --   HGB 9.0*   < > 10.9* 11.2* 9.4* 9.1*  HCT 30.1*   < > 37.5 33.0* 31.9* 29.9*  MCV 91.8  --  93.8  --  92.2 90.1  PLT 276  --  278  --  245 256   < > = values in this interval not displayed.   Blood Culture    Component Value Date/Time   SDES PERITONEAL 01/04/2024 1322   SDES PERITONEAL 01/04/2024 1322   SPECREQUEST NONE 01/04/2024 1322   SPECREQUEST NONE 01/04/2024 1322   CULT  01/04/2024 1322    NO GROWTH 5 DAYS Performed at [facility], [address] [place], [place] [address]    REPTSTATUS 01/09/2024 FINAL 01/04/2024 1322   REPTSTATUS 01/04/2024 FINAL 01/04/2024 1322    Physical Exam General: Lying in bed, nad  Heart: RRR Lungs: Clear, normal wob Abdomen: non-tender Extremities: no LE edema  Dialysis Access: R AVF   Medications:   Chlorhexidine  Gluconate Cloth  6 each Topical Q0600   fluticasone   2 spray Each Nare Daily   insulin  aspart  0-5  Units Subcutaneous QHS   insulin  aspart  0-9 Units Subcutaneous TID WC   insulin  glargine  10 Units Subcutaneous QHS   lamoTRIgine   200 mg Oral Daily   OLANZapine  zydis  5 mg Oral QHS   pantoprazole   40 mg Oral Daily   sodium bicarbonate   650 mg Oral BID    Dialysis Orders: [facility] [address]   Assessment/Plan: Hyperkalemia: K+ 5.7 on admission. Resolved with HD.  ESRD: on HD MWF.  Dialyzed Thursday here. Plan for next HD at outpatient center Saturday.  HTN: bp's normal to slightly high. Resume home meds prn.  Volume: mild pulm edema on CXR, mild edema in legs. Chronic ascites. Continue HD for volume removal.  Anemia of esrd: Hb 9s. Continue ESA as outpatient.   Maisie Ronnald Acosta PA-C [facility] 01/17/2024,10:40 AM

## 2024-01-17 NOTE — TOC CM/SW Note (Signed)
 Transition of Care [facility]) - Inpatient Brief Assessment   Patient Details  Name: Ashley Freeman MRN: [id number] Date of Birth: Feb 24, 1991  Transition of Care [facility]) CM/SW Contact:    Waddell Barnie Rama, RN Phone Number: 01/17/2024, 10:20 AM   Clinical Narrative: From home with mom, has PCP and insurance on file,   States family member will transport them home at [employer] and family is support system, .  Pta self ambulatory.   There are no ICM needs identified  at this time.  Please place consult for ICM  needs.     Transition of Care Asessment: Insurance and Status: Insurance coverage has been reviewed Patient has primary care physician: Yes Home environment has been reviewed: home with mom Prior level of function:: indep Prior/Current Home Services: No current home services Social Drivers of Health Review: SDOH reviewed no interventions necessary Readmission risk has been reviewed: Yes Transition of care needs: no transition of care needs at this time

## 2024-01-17 NOTE — Discharge Summary (Signed)
 Physician Discharge Summary  Ashley Freeman FMW:[id number] DOB: 02/23/1991 DOA: 01/15/2024  PCP: Keven Crumbly Pap, MD  Admit date: 01/15/2024 Discharge date: 01/17/2024  Admitted From:  Discharge disposition: home   Recommendations for Outpatient Follow-Up:   Compliance with HD   Discharge Diagnosis:   Principal Problem:   Hyperkalemia Active Problems:   ESRD on dialysis (HCC)   Chronic combined systolic and diastolic heart failure (HCC)   Bipolar disorder (HCC)   Type 1 diabetes mellitus (HCC)   Generalized abdominal pain   High anion gap metabolic acidosis   Allergic rhinitis   History of anemia due to chronic kidney disease    Discharge Condition: Improved.  Diet recommendation: renal/ Carbohydrate-modified.   Wound care: None.  Code status: Full.   History of Present Illness:   Ashley Freeman is a 33 y.o. female with medical history significant for end-stage renal disease on hemodialysis on Monday, Wednesday, Friday, type 1 diabetes mellitus, chronic systolic heart failure, who is admitted to [facility] on 01/15/2024 with hyperkalemia after presenting from home to [facility] ED complaining of abdominal pain.    The patient reports 2-day history of crampy generalized abdominal discomfort, worse with palpation.  No associated any nausea, vomiting, diarrhea.  Denies any associated subjective fever, chills or rigors, or generalized myalgias.   Hospital Course by Problem:   Hyperkalemia:  -resolved after HD     Generalized abdominal discomfort: Recurrence of her generalized abdominal discomfort, which she reports occurs in the setting of mild volume overload after missing hemodialysis sessions, with today CT abdomen/pelvis showing evidence of acute addition of moderate ascites -eating well - Getting HD     ESRD:  -On hemodialysis, on Monday, Friday Wednesday, Friday schedule, with patient having missed each of her last  hemodialysis sessions, with most recent complete session occurring on Friday, 01/10/2024.  -Renal consultation, getting HD on 9/18 - much improved s/p HD      Type1 Diabetes Mellitus: -Lantus /sliding scale insulin       Chronic systolic/diastolic heart failure:  -Volume controlled by hemodialysis      Bipolar disorder: -Continue outpatient Lamictal  and olanzapine .       Anemia of chronic kidney disease:  - outpateint follow up    Medical Consultants:   renal   Discharge Exam:   Vitals:   01/17/24 0359 01/17/24 0737  BP: 105/70   Pulse: 82   Resp: 12   Temp: 99 F (37.2 C) 98.9 F (37.2 C)  SpO2: 93%    Vitals:   01/17/24 0012 01/17/24 0357 01/17/24 0359 01/17/24 0737  BP: 110/64  105/70   Pulse: 91  82   Resp: 17  12   Temp: 99.2 F (37.3 C)  99 F (37.2 C) 98.9 F (37.2 C)  TempSrc: Oral  Oral Oral  SpO2: 92%  93%   Weight:  63.9 kg    Height:        General exam: Appears calm and comfortable.   The results of significant diagnostics from this hospitalization (including imaging, microbiology, ancillary and laboratory) are listed below for reference.     Procedures and Diagnostic Studies:   CT ABDOMEN PELVIS WO CONTRAST Result Date: 01/15/2024 CLINICAL DATA:  Abdominal pain, acute, nonlocalized generalized pain and missed dialysis. Dialysis days are MWF- states had a family emergency today. EXAM: CT ABDOMEN AND PELVIS WITHOUT CONTRAST TECHNIQUE: Multidetector CT imaging of the abdomen and pelvis was performed following the standard protocol without IV contrast. RADIATION  DOSE REDUCTION: This exam was performed according to the departmental dose-optimization program which includes automated exposure control, adjustment of the mA and/or kV according to patient size and/or use of iterative reconstruction technique. COMPARISON:  CT abdomen pelvis 01/04/2024 FINDINGS: Lower chest: Interval increase in trace right pleural effusion. Bilateral lower lobe  atelectasis. Enlarged cardiac silhouette. Cardiac findings consistent with anemia. Hepatobiliary: The liver is enlarged measuring up to 21 cm. No focal liver abnormality. Status post cholecystectomy. No biliary dilatation. Pancreas: No focal lesion. Normal pancreatic contour. No surrounding inflammatory changes. No main pancreatic ductal dilatation. Spleen: Normal in size without focal abnormality. Adrenals/Urinary Tract: No adrenal nodule bilaterally. No nephrolithiasis and no hydronephrosis. No definite contour-deforming renal mass. No ureterolithiasis or hydroureter. The urinary bladder is unremarkable. Stomach/Bowel: Stomach is within normal limits. No evidence of bowel wall thickening or dilatation. Appendix appears normal. Vascular/Lymphatic: No abdominal aorta or iliac aneurysm. Mild atherosclerotic plaque of the aorta and its branches. Similar-appearing multiple prominent retroperitoneal and pelvic lymph nodes. No abdominal, pelvic, or inguinal lymphadenopathy. Reproductive: Uterus and bilateral adnexa are unremarkable. Other: Moderate simple free fluid. No intraperitoneal free gas. No organized fluid collection. Musculoskeletal: No abdominal wall hernia or abnormality. Diffuse subcutaneus soft tissue edema. No suspicious lytic or blastic osseous lesions. No acute displaced fracture. IMPRESSION: 1. Findings suggestive of volume overload. 2. Interval increase in trace right pleural effusion. 3. Moderate volume simple ascites. 4. Diffuse subcutaneus soft tissue edema. 5. Similar-appearing multiple prominent retroperitoneal and pelvic lymph nodes. 6. Hepatomegaly. 7. Cardiomegaly. Electronically Signed   By: Morgane  Naveau M.D.   On: 01/15/2024 18:49   DG Chest 2 View Result Date: 01/15/2024 CLINICAL DATA:  shob EXAM: CHEST - 2 VIEW COMPARISON:  Chest x-ray 01/11/2024, CT chest 07/15/2023 FINDINGS: The heart and mediastinal contours are unchanged. Low lung volumes. Persistent mild interstitial airspace  opacities. No pleural effusion. No pneumothorax. No acute osseous abnormality. IMPRESSION: Low lung volumes.  Persistent mild interstitial airspace opacities. Electronically Signed   By: Morgane  Naveau M.D.   On: 01/15/2024 18:15     Labs:   Basic Metabolic Panel: Recent Labs  Lab 01/11/24 2041 01/11/24 2058 01/15/24 1651 01/15/24 1707 01/16/24 0100 01/16/24 0608 01/17/24 0309  NA 140   < > 142 138 142 139 136  K 3.8   < > 5.7* 5.5* 5.5* 5.6* 3.9  CL 106   < > 103 107 104 99 99  CO2 20*  --  18*  --  19* 20* 23  GLUCOSE 107*   < > 172* 177* 126* 191* 221*  BUN 37*   < > 38* 36* 40* 43* 29*  CREATININE 7.49*   < > 7.32* 7.90* 7.55* 7.80* 5.44*  CALCIUM  8.0*  --  8.7*  --  8.5* 8.5* 8.2*  MG  --   --   --   --   --  2.7*  --   PHOS  --   --   --   --   --  8.5*  --    < > = values in this interval not displayed.   GFR Estimated Creatinine Clearance: 13.5 mL/min (A) (by C-G formula based on SCr of 5.44 mg/dL (H)). Liver Function Tests: Recent Labs  Lab 01/11/24 2041 01/15/24 1651 01/16/24 0608  AST 53* 77* 92*  ALT 31 43 45*  ALKPHOS 400* 557* 516*  BILITOT 0.7 1.0 1.3*  PROT 4.9* 5.9* 5.1*  ALBUMIN  2.5* 3.1* 2.9*   Recent Labs  Lab 01/15/24 1651  LIPASE  24   No results for input(s): AMMONIA in the last 168 hours. Coagulation profile No results for input(s): INR, PROTIME in the last 168 hours.  CBC: Recent Labs  Lab 01/11/24 2041 01/11/24 2058 01/15/24 1651 01/15/24 1707 01/16/24 0608 01/17/24 0309  WBC 12.0*  --  10.5  --  10.5 12.0*  NEUTROABS 5.8  --  5.1  --  5.1  --   HGB 9.0* 10.2* 10.9* 11.2* 9.4* 9.1*  HCT 30.1* 30.0* 37.5 33.0* 31.9* 29.9*  MCV 91.8  --  93.8  --  92.2 90.1  PLT 276  --  278  --  245 256   Cardiac Enzymes: No results for input(s): CKTOTAL, CKMB, CKMBINDEX, TROPONINI in the last 168 hours. BNP: Invalid input(s): POCBNP CBG: Recent Labs  Lab 01/15/24 2154 01/16/24 0803 01/16/24 1804 01/16/24 2026  01/17/24 0538  GLUCAP 89 177* 126* 220* 256*   D-Dimer No results for input(s): DDIMER in the last 72 hours. Hgb A1c No results for input(s): HGBA1C in the last 72 hours. Lipid Profile No results for input(s): CHOL, HDL, LDLCALC, TRIG, CHOLHDL, LDLDIRECT in the last 72 hours. Thyroid  function studies No results for input(s): TSH, T4TOTAL, T3FREE, THYROIDAB in the last 72 hours.  Invalid input(s): FREET3 Anemia work up No results for input(s): VITAMINB12, FOLATE, FERRITIN, TIBC, IRON , RETICCTPCT in the last 72 hours. Microbiology Recent Results (from the past 240 hours)  Resp panel by RT-PCR (RSV, Flu A&B, Covid) Anterior Nasal Swab     Status: None   Collection Time: 01/15/24  5:12 PM   Specimen: Anterior Nasal Swab  Result Value Ref Range Status   SARS Coronavirus 2 by RT PCR NEGATIVE NEGATIVE Final   Influenza A by PCR NEGATIVE NEGATIVE Final   Influenza B by PCR NEGATIVE NEGATIVE Final    Comment: (NOTE) The Xpert Xpress SARS-CoV-2/FLU/RSV plus assay is intended as an aid in the diagnosis of influenza from Nasopharyngeal swab specimens and should not be used as a sole basis for treatment. Nasal washings and aspirates are unacceptable for Xpert Xpress SARS-CoV-2/FLU/RSV testing.  Fact Sheet for Patients: BloggerCourse.com  Fact Sheet for Healthcare Providers: SeriousBroker.it  This test is not yet approved or cleared by the [place]  FDA and has been authorized for detection and/or diagnosis of SARS-CoV-2 by FDA under an Emergency Use Authorization (EUA). This EUA will remain in effect (meaning this test can be used) for the duration of the COVID-19 declaration under Section 564(b)(1) of the Act, 21 U.S.C. section 360bbb-3(b)(1), unless the authorization is terminated or revoked.     Resp Syncytial Virus by PCR NEGATIVE NEGATIVE Final    Comment: (NOTE) Fact Sheet for  Patients: BloggerCourse.com  Fact Sheet for Healthcare Providers: SeriousBroker.it  This test is not yet approved or cleared by the [place]  FDA and has been authorized for detection and/or diagnosis of SARS-CoV-2 by FDA under an Emergency Use Authorization (EUA). This EUA will remain in effect (meaning this test can be used) for the duration of the COVID-19 declaration under Section 564(b)(1) of the Act, 21 U.S.C. section 360bbb-3(b)(1), unless the authorization is terminated or revoked.  Performed at [facility], [address], [place], [place] [address]   MRSA Next Gen by PCR, Nasal     Status: None   Collection Time: 01/16/24  8:59 PM   Specimen: Nasal Mucosa; Nasal Swab  Result Value Ref Range Status   MRSA by PCR Next Gen NOT DETECTED NOT DETECTED Final    Comment: (  NOTE) The GeneXpert MRSA Assay (FDA approved for NASAL specimens only), is one component of a comprehensive MRSA colonization surveillance program. It is not intended to diagnose MRSA infection nor to guide or monitor treatment for MRSA infections. Test performance is not FDA approved in patients less than 51 years old. Performed at [facility], [address], [place], [place] [address]      Discharge Instructions:   Discharge Instructions     Diet Carb Modified   Complete by: As directed    Increase activity slowly   Complete by: As directed       Allergies as of 01/17/2024       Reactions   Keflex  [cephalexin ] Anaphylaxis   Ceftriaxone  in the past with no reaction   Penicillins Anaphylaxis, Hives, Rash   Vibramycin  [doxycycline ] Anaphylaxis   Benadryl  [diphenhydramine ] Itching   Dilaudid  [hydromorphone ] Itching   Methotrexate Derivatives Rash   Roxicodone  [oxycodone ] Itching        Medication List     TAKE these medications    albuterol  108 (90 Base) MCG/ACT inhaler Commonly known as: VENTOLIN  HFA Inhale 2 puffs  into the lungs every 4 (four) hours as needed for wheezing or shortness of breath.   bumetanide  2 MG tablet Commonly known as: BUMEX  Take 10 mg by mouth daily.   calcitRIOL  0.25 MCG capsule Commonly known as: ROCALTROL  Take 5 capsules (1.25 mcg total) by mouth every Tuesday, Thursday, and Saturday at 6 PM. What changed: when to take this   carvedilol  25 MG tablet Commonly known as: COREG  Take 25 mg by mouth 2 (two) times daily with a meal.   DULoxetine  20 MG capsule Commonly known as: CYMBALTA  Take 40 mg by mouth daily.   Entresto 24-26 MG Generic drug: sacubitril-valsartan Take 1 tablet by mouth 2 (two) times daily.   famotidine  20 MG tablet Commonly known as: PEPCID  Take 20 mg by mouth daily.   fluticasone  50 MCG/ACT nasal spray Commonly known as: FLONASE  Place 2 sprays into both nostrils daily.   hydrOXYzine  25 MG tablet Commonly known as: ATARAX  Take 25 mg by mouth 3 (three) times daily as needed for anxiety, itching, nausea or vomiting.   insulin  aspart 100 UNIT/ML injection Commonly known as: novoLOG  Inject 5 Units into the skin 3 (three) times daily with meals.   ketoconazole  2 % cream Commonly known as: NIZORAL  Apply 1 Application topically 2 (two) times daily. Apply 1gm twice daily to affected skin on feet   lamoTRIgine  200 MG tablet Commonly known as: LAMICTAL  Take 1 tablet (200 mg total) by mouth daily.   Lantus  SoloStar 100 UNIT/ML Solostar Pen Generic drug: insulin  glargine Inject 10 Units into the skin daily.   OLANZapine  zydis 5 MG disintegrating tablet Commonly known as: ZYPREXA  Take 5 mg by mouth at bedtime.   omeprazole  40 MG capsule Commonly known as: PRILOSEC Take 40 mg by mouth daily.   ondansetron  4 MG disintegrating tablet Commonly known as: ZOFRAN -ODT Take 1 tablet (4 mg total) by mouth every 8 (eight) hours as needed for nausea or vomiting.   Pancrelipase  (Lip-Prot-Amyl) 3000-9500 units Cpep Take 3,000 units of lipase by mouth 3  (three) times daily before meals.   prochlorperazine  5 MG tablet Commonly known as: COMPAZINE  Take 5 mg by mouth every 6 (six) hours as needed for nausea or vomiting.   sodium bicarbonate  650 MG tablet Take 650 mg by mouth 2 (two) times daily.   SUMAtriptan  50 MG tablet Commonly known as: IMITREX  Take  50 mg by mouth every 2 (two) hours as needed for migraine or headache.          Time coordinating discharge: 45 min  Signed:  Harlene RAYMOND Bowl DO  [facility] 01/17/2024, 9:41 AM

## 2024-01-17 NOTE — Progress Notes (Signed)
 Discharge Nurse Summary: DC order noted per MD. DC RN at bedside with patient. Patient agreeable with discharge plan, agreeable to transport to dc lounge for taxi voucher.   AVS printed/reviewed. PIV removed, skin intact. Telemonitor not present on assessment. No DME needs. No TOC/home meds. CP/Edu resolved. Patient dressed and ready to go. All belongings accounted for. Transport call for transport downstairs to [facility].

## 2024-01-17 NOTE — Progress Notes (Addendum)
 Contacted by nephrologist with request for pt to receive out-pt HD treatment tomorrow if clinic has availability. Contacted [facility]. Clinic can treat pt tomorrow. Pt will need to arrive at 11:00 am for 11:15 am chair time. Pt informed RN she would have transportation to appt tomorrow. Team made aware of this info and added HD appt for tomorrow to pt's AVS. Clinic aware pt should d/c today and resume care tomorrow. Will fax d/c summary and last renal note to clinic for continuation of care once available.    Randine Mungo Dialysis Navigator [phone]  Addendum at 3:34 pm: D/C summary and today's renal note faxed to clinic for continuation of care.

## 2024-01-21 ENCOUNTER — Ambulatory Visit: Admitting: Family Medicine

## 2024-01-21 NOTE — Progress Notes (Deleted)
   IIleana Collet, PhD, LAT, ATC acting as a scribe for Artist Lloyd, MD.  Aldean RAMAN [place] is a 33 y.o. female who presents to [facility] today for bilat leg pain x ***   Pertinent review of systems: ***  Relevant historical information: ***   Exam:  LMP  (LMP Unknown) Comment: patient states she doesnt get her period General: Well Developed, well nourished, and in no acute distress.   MSK: ***    Lab and Radiology Results No results found for this or any previous visit (from the past 72 hours).  No results found.      Assessment and Plan: 33 y.o. female with ***   PDMP not reviewed this encounter. No orders of the defined types were placed in this encounter.  No orders of the defined types were placed in this encounter.    Discussed warning signs or symptoms. Please see discharge instructions. Patient expresses understanding.   ***

## 2024-01-24 ENCOUNTER — Emergency Department (HOSPITAL_COMMUNITY)

## 2024-01-24 ENCOUNTER — Other Ambulatory Visit: Payer: Self-pay

## 2024-01-24 ENCOUNTER — Emergency Department (HOSPITAL_COMMUNITY)
Admission: EM | Admit: 2024-01-24 | Discharge: 2024-01-25 | Disposition: A | Discharge status: Home / Self Care | Attending: Emergency Medicine | Admitting: Emergency Medicine

## 2024-01-24 ENCOUNTER — Encounter (HOSPITAL_COMMUNITY): Payer: Self-pay

## 2024-01-24 DIAGNOSIS — R0602 Shortness of breath: Secondary | ICD-10-CM | POA: Insufficient documentation

## 2024-01-24 DIAGNOSIS — E875 Hyperkalemia: Secondary | ICD-10-CM | POA: Diagnosis not present

## 2024-01-24 DIAGNOSIS — Z91158 Patient's noncompliance with renal dialysis for other reason: Secondary | ICD-10-CM

## 2024-01-24 DIAGNOSIS — Z992 Dependence on renal dialysis: Secondary | ICD-10-CM | POA: Diagnosis not present

## 2024-01-24 DIAGNOSIS — N186 End stage renal disease: Secondary | ICD-10-CM | POA: Diagnosis not present

## 2024-01-24 DIAGNOSIS — Z7951 Long term (current) use of inhaled steroids: Secondary | ICD-10-CM | POA: Insufficient documentation

## 2024-01-24 DIAGNOSIS — Z794 Long term (current) use of insulin: Secondary | ICD-10-CM | POA: Diagnosis not present

## 2024-01-24 NOTE — ED Provider Triage Note (Signed)
 Emergency Medicine Provider Triage Evaluation Note  Ashley Freeman , a 33 y.o. female  was evaluated in triage.  Pt complains of SOB.  MWF HD patient, missed Wednesday and Friday as she was not feeling well.  States SOB and some body aches.  Denies fever.  Usually dialyzes at [facility].  Review of Systems  Positive: SOB, body aches Negative: Chest pain  Physical Exam  BP (!) 168/94   Pulse 91   Temp 98.4 F (36.9 C) (Oral)   Resp 13   SpO2 98%  Gen:   Awake, no distress   Resp:  Normal effort  MSK:   Moves extremities without difficulty  Other:    Medical Decision Making  Medically screening exam initiated at 11:32 PM.  Appropriate orders placed.  Ashley Freeman was informed that the remainder of the evaluation will be completed by another provider, this initial triage assessment does not replace that evaluation, and the importance of remaining in the ED until their evaluation is complete.  SOB, bodyaches.  MWF HD patient, missed x2 this week due to feeling poorly.  EKG, labs, RVP, CXR.   Ashley Olam HERO, PA-C 01/24/24 2334

## 2024-01-24 NOTE — ED Triage Notes (Signed)
 Pt is coming from home with complaints of shortness of breath, she is a dialysis pt, she missed Wednesday and Fridays dialysis. No other complaints at this time. She is mildy short of breath at rest but it does worsen with exertion.   Medic Vitals   184/94 93hr 18rr 95%ra 186bgl

## 2024-01-25 LAB — CBC WITH DIFFERENTIAL/PLATELET
Abs Immature Granulocytes: 0.09 K/uL — ABNORMAL HIGH (ref 0.00–0.07)
Basophils Absolute: 0.2 K/uL — ABNORMAL HIGH (ref 0.0–0.1)
Basophils Relative: 1 %
Eosinophils Absolute: 1.7 K/uL — ABNORMAL HIGH (ref 0.0–0.5)
Eosinophils Relative: 16 %
HCT: 39.4 % (ref 36.0–46.0)
Hemoglobin: 12.1 g/dL (ref 12.0–15.0)
Immature Granulocytes: 1 %
Lymphocytes Relative: 32 %
Lymphs Abs: 3.5 K/uL (ref 0.7–4.0)
MCH: 27.4 pg (ref 26.0–34.0)
MCHC: 30.7 g/dL (ref 30.0–36.0)
MCV: 89.3 fL (ref 80.0–100.0)
Monocytes Absolute: 0.7 K/uL (ref 0.1–1.0)
Monocytes Relative: 6 %
Neutro Abs: 4.8 K/uL (ref 1.7–7.7)
Neutrophils Relative %: 44 %
Platelets: 359 K/uL (ref 150–400)
RBC: 4.41 MIL/uL (ref 3.87–5.11)
RDW: 16.1 % — ABNORMAL HIGH (ref 11.5–15.5)
WBC: 10.9 K/uL — ABNORMAL HIGH (ref 4.0–10.5)
nRBC: 0.5 % — ABNORMAL HIGH (ref 0.0–0.2)

## 2024-01-25 LAB — BASIC METABOLIC PANEL WITH GFR
Anion gap: 22 — ABNORMAL HIGH (ref 5–15)
BUN: 88 mg/dL — ABNORMAL HIGH (ref 6–20)
CO2: 14 mmol/L — ABNORMAL LOW (ref 22–32)
Calcium: 8.9 mg/dL (ref 8.9–10.3)
Chloride: 102 mmol/L (ref 98–111)
Creatinine, Ser: 10.91 mg/dL — ABNORMAL HIGH (ref 0.44–1.00)
GFR, Estimated: 4 mL/min — ABNORMAL LOW (ref >60)
Glucose, Bld: 179 mg/dL — ABNORMAL HIGH (ref 70–99)
Potassium: 6.6 mmol/L (ref 3.5–5.1)
Sodium: 138 mmol/L (ref 135–145)

## 2024-01-25 LAB — RESP PANEL BY RT-PCR (RSV, FLU A&B, COVID)  RVPGX2
Influenza A by PCR: NEGATIVE
Influenza B by PCR: NEGATIVE
Resp Syncytial Virus by PCR: NEGATIVE
SARS Coronavirus 2 by RT PCR: NEGATIVE

## 2024-01-25 LAB — CBG MONITORING, ED: Glucose-Capillary: 272 mg/dL — ABNORMAL HIGH (ref 70–99)

## 2024-01-25 MED ORDER — ACETAMINOPHEN 500 MG PO TABS: 1000.0000 mg | ORAL_TABLET | Freq: Once | ORAL | Status: DC

## 2024-01-25 MED ORDER — FENTANYL CITRATE PF 50 MCG/ML IJ SOSY: 50.0000 ug | PREFILLED_SYRINGE | Freq: Once | INTRAMUSCULAR | Status: DC

## 2024-01-25 MED ORDER — SODIUM ZIRCONIUM CYCLOSILICATE 10 G PO PACK
10.0000 g | PACK | Freq: Once | ORAL | Status: AC
  Administered 2024-01-25: 10 g via ORAL
  Filled 2024-01-25: qty 1

## 2024-01-25 MED ORDER — CALCIUM GLUCONATE-NACL 1-0.675 GM/50ML-% IV SOLN
1.0000 g | Freq: Once | INTRAVENOUS | Status: AC
  Administered 2024-01-25: 1000 mg via INTRAVENOUS
  Filled 2024-01-25: qty 50

## 2024-01-25 MED ORDER — CHLORHEXIDINE GLUCONATE CLOTH 2 % EX PADS: 6.0000 | MEDICATED_PAD | Freq: Every day | CUTANEOUS | Status: DC

## 2024-01-25 MED ORDER — INSULIN ASPART 100 UNIT/ML IV SOLN
5.0000 [IU] | Freq: Once | INTRAVENOUS | Status: AC
  Administered 2024-01-25: 5 [IU] via INTRAVENOUS

## 2024-01-25 MED ORDER — OXYCODONE HCL 5 MG PO TABS
ORAL_TABLET | ORAL | Status: AC
  Filled 2024-01-25: qty 2

## 2024-01-25 MED ORDER — DEXTROSE 50 % IV SOLN
1.0000 | Freq: Once | INTRAVENOUS | Status: AC
  Administered 2024-01-25: 50 mL via INTRAVENOUS
  Filled 2024-01-25: qty 50

## 2024-01-25 MED ORDER — OXYCODONE HCL 5 MG PO TABS
10.0000 mg | ORAL_TABLET | Freq: Once | ORAL | Status: AC
  Administered 2024-01-25: 10 mg via ORAL

## 2024-01-25 MED ORDER — HYDROXYZINE HCL 25 MG PO TABS
25.0000 mg | ORAL_TABLET | Freq: Once | ORAL | Status: AC
  Administered 2024-01-25: 25 mg via ORAL
  Filled 2024-01-25: qty 1

## 2024-01-25 MED ORDER — FENTANYL CITRATE PF 50 MCG/ML IJ SOSY
50.0000 ug | PREFILLED_SYRINGE | Freq: Once | INTRAMUSCULAR | Status: AC
  Administered 2024-01-25: 50 ug via INTRAVENOUS
  Filled 2024-01-25: qty 1

## 2024-01-25 NOTE — ED Notes (Signed)
 Called and they will bring salad and fruit up to HD

## 2024-01-25 NOTE — ED Provider Notes (Signed)
 [place] EMERGENCY DEPARTMENT AT [facility] Provider Note   CSN: [id number] Arrival date & time: 01/24/24  2322     Patient presents with: Shortness of Breath   Ashley Freeman is a 33 y.o. female.   The history is provided by the patient and medical records.  Shortness of Breath  33 year old female with history of ESRD on HD (MWF), presenting to the ED with shortness of breath.  States she has missed sessions on Wednesday and Friday this week as she was feeling poorly.  Started having increased shortness of breath this evening.  Also having some bodyaches, cough, and congestion.  She denies any fever.  No reported sick contacts.  Usually dialyzes at the [facility] in [place].  Prior to Admission medications   Medication Sig Start Date End Date Taking? Authorizing Provider  albuterol  (VENTOLIN  HFA) 108 (90 Base) MCG/ACT inhaler Inhale 2 puffs into the lungs every 4 (four) hours as needed for wheezing or shortness of breath. 11/24/22   [provider]  bumetanide  (BUMEX ) 2 MG tablet Take 10 mg by mouth daily.    [provider]  calcitRIOL  (ROCALTROL ) 0.25 MCG capsule Take 5 capsules (1.25 mcg total) by mouth every Tuesday, Thursday, and Saturday at 6 PM. Patient taking differently: Take 1.25 mcg by mouth every Monday, Wednesday, and Friday. 07/09/23   Cindy Garnette POUR, MD  carvedilol  (COREG ) 25 MG tablet Take 25 mg by mouth 2 (two) times daily with a meal.    [provider]  DULoxetine  (CYMBALTA ) 20 MG capsule Take 40 mg by mouth daily. 07/29/23   [provider]  famotidine  (PEPCID ) 20 MG tablet Take 20 mg by mouth daily. 07/29/23   [provider]  fluticasone  (FLONASE ) 50 MCG/ACT nasal spray Place 2 sprays into both nostrils daily. 12/11/23 12/10/24  [provider]  hydrOXYzine  (ATARAX ) 25 MG tablet Take 25 mg by mouth 3 (three) times daily as needed for anxiety, itching, nausea or vomiting.    [provider]  insulin  aspart (NOVOLOG ) 100 UNIT/ML injection Inject 5 Units into the skin 3 (three) times daily with meals.    [provider]  ketoconazole  (NIZORAL ) 2 % cream Apply 1 Application topically 2 (two) times daily. Apply 1gm twice daily to affected skin on feet 01/06/24   McCaughan, Dia D, DPM  lamoTRIgine  (LAMICTAL ) 200 MG tablet Take 1 tablet (200 mg total) by mouth daily. 10/29/23   Regalado, Belkys A, MD  LANTUS  SOLOSTAR 100 UNIT/ML Solostar Pen Inject 10 Units into the skin daily.    [provider]  OLANZapine  zydis (ZYPREXA ) 5 MG disintegrating tablet Take 5 mg by mouth at bedtime. 09/23/23 02/08/24  [provider]  omeprazole  (PRILOSEC) 40 MG capsule Take 40 mg by mouth daily. 12/11/23 02/09/24  [provider]  ondansetron  (ZOFRAN -ODT) 4 MG disintegrating tablet Take 1 tablet (4 mg total) by mouth every 8 (eight) hours as needed for nausea or vomiting. 06/23/23   Davis, Jonathon H, MD  Pancrelipase , Lip-Prot-Amyl, 3000-9500 units CPEP Take 3,000 units of lipase by mouth 3 (three) times daily before meals. 09/23/23 02/08/24  [provider]  prochlorperazine  (COMPAZINE ) 5 MG tablet Take 5 mg by mouth every 6 (six) hours as needed for nausea or vomiting.    [provider]  sacubitril-valsartan (ENTRESTO) 24-26 MG Take 1 tablet by mouth 2 (two) times daily.    [provider]  sodium bicarbonate  650 MG tablet Take 650 mg by mouth 2 (two)  times daily. 07/29/23   [provider]  SUMAtriptan  (IMITREX ) 50 MG tablet Take 50 mg by mouth every 2 (two) hours as needed for migraine or headache. 06/20/22   [provider]    Allergies: Keflex  [cephalexin ], Penicillins, Vibramycin  [doxycycline ], Benadryl  [diphenhydramine ], Dilaudid  [hydromorphone ], Methotrexate derivatives, and Roxicodone  [oxycodone ]    Review of Systems  Respiratory:  Positive for shortness of breath.   All other systems reviewed and are  negative.   Updated Vital Signs BP (!) 168/94   Pulse 91   Temp 98.4 F (36.9 C) (Oral)   Resp 13   SpO2 98%   Physical Exam Vitals and nursing note reviewed.  Constitutional:      Appearance: She is well-developed.  HENT:     Head: Normocephalic and atraumatic.  Eyes:     Conjunctiva/sclera: Conjunctivae normal.     Pupils: Pupils are equal, round, and reactive to light.  Cardiovascular:     Rate and Rhythm: Normal rate and regular rhythm.     Heart sounds: Normal heart sounds.  Pulmonary:     Effort: Pulmonary effort is normal.     Breath sounds: Normal breath sounds. No wheezing or rhonchi.  Abdominal:     General: Bowel sounds are normal.     Palpations: Abdomen is soft.  Musculoskeletal:        General: Normal range of motion.     Cervical back: Normal range of motion.     Comments: Fistula right upper extremity, thrill present  Skin:    General: Skin is warm and dry.  Neurological:     Mental Status: She is alert and oriented to person, place, and time.     (all labs ordered are listed, but only abnormal results are displayed) Labs Reviewed  CBC WITH DIFFERENTIAL/PLATELET - Abnormal; Notable for the following components:      Result Value   WBC 10.9 (*)    RDW 16.1 (*)    nRBC 0.5 (*)    Eosinophils Absolute 1.7 (*)    Basophils Absolute 0.2 (*)    Abs Immature Granulocytes 0.09 (*)    All other components within normal limits  BASIC METABOLIC PANEL WITH GFR - Abnormal; Notable for the following components:   Potassium 6.6 (*)    CO2 14 (*)    Glucose, Bld 179 (*)    BUN 88 (*)    Creatinine, Ser 10.91 (*)    GFR, Estimated 4 (*)    Anion gap 22 (*)    All other components within normal limits  RESP PANEL BY RT-PCR (RSV, FLU A&B, COVID)  [id number]    EKG: None  Radiology: DG Chest 2 View Result Date: 01/24/2024 CLINICAL DATA:  Shortness of breath.  Missed hemodialysis EXAM: CHEST - 2 VIEW COMPARISON:  01/15/2024 FINDINGS: Shallow inspiration.  Interstitial infiltrates with mild bronchiectasis in the right lung base, unchanged. No developing consolidation or edema. No pleural effusion or pneumothorax. Mediastinal contours appear intact. Mild cardiac enlargement. IMPRESSION: Similar appearance to previous studies with mild cardiac enlargement and bronchiectasis with mild interstitial infiltrates in the lung bases. Electronically Signed   By: Elsie Gravely M.D.   On: 01/24/2024 23:51     Procedures   CRITICAL CARE Performed by: Olam CHRISTELLA Slocumb   Total critical care time: 45 minutes  Critical care time was exclusive of separately billable procedures and treating other patients.  Critical care was necessary to treat or prevent imminent or life-threatening deterioration.  Critical care was time spent  personally by me on the following activities: development of treatment plan with patient and/or surrogate as well as nursing, discussions with consultants, evaluation of patient's response to treatment, examination of patient, obtaining history from patient or surrogate, ordering and performing treatments and interventions, ordering and review of laboratory studies, ordering and review of radiographic studies, pulse oximetry and re-evaluation of patient's condition.   Medications Ordered in the ED - No data to display                                  Medical Decision Making Amount and/or Complexity of Data Reviewed Labs: ordered. Radiology: ordered and independent interpretation performed. ECG/medicine tests: ordered and independent interpretation performed.  Risk OTC drugs. Prescription drug management. Decision regarding hospitalization.   33 y.o. F here with SOB.  Missed 2 HD sessions this week as she was feeling unwell.  EKG without acute changes.  K+ 6.6 on labs.  Some vascular congestion on CXR but remains HD stable on RA without hypoxia.  Given hyperkalemia protocol.  Will discuss with nephrology.  1:39 AM Spoke with  Dr. Tobie with nephrology-- placing orders for HD to be done ASAP.    4:06 AM Patient is awaiting hemodialysis.  She has expressed to RN that she wants to leave.  I spoke with her about this and she states she is upset as she is not getting any IV pain medication.  She continues arguing with me that you just do not understand this pain from not getting dialysis.  I expressed to her that this is why it is important for her to go to her regularly scheduled dialysis sessions.  I have offered her tylenol  but she has refused.  I have informed her that there is a high likelihood that she will decompensate and potentially die without dialysis as I do not feel she can wait until Monday to have this done.  She states they always tell me that.  She is still desiring to sign out AMA.  She is awake, alert, of sound mind and capable of making her own medical decisions at this time.  She will sign out AMA.  4:15 AM Patient now agreeable to stay for HD if she can have something to eat.  She was given bagged meal.  Final diagnoses:  Hyperkalemia  Dialysis patient, noncompliant    ED Discharge Orders     None          [name], PA-C 01/25/24 0553    Midge Golas, MD 01/25/24 [phone]

## 2024-01-25 NOTE — ED Notes (Signed)
Called for meal

## 2024-01-25 NOTE — ED Triage Notes (Signed)
 The lab has called a pot of 6.6 acuity increased  and charge rn has been notified

## 2024-01-25 NOTE — Progress Notes (Signed)
 Asked by EDP to provide dialysis for Ashley Freeman who has missed her OP HD at [facility] [address] this past Wednesday and Friday. She is hyperkalemic. ED dialysis unit orders placed for hemodialysis followed by return to ER for re-eval and discharge.

## 2024-01-25 NOTE — ED Provider Notes (Signed)
 Pt awake/alert, no distress However she was found to have hyperkalemia No EKG changes Awaiting callback from nephrology  ED ECG REPORT   Date: 01/24/2024 2337  Rate: 91  Rhythm: normal sinus rhythm  QRS Axis: normal  Intervals: normal  ST/T Wave abnormalities: normal  Conduction Disutrbances:none  Narrative Interpretation:   Old EKG Reviewed: unchanged  I have personally reviewed the EKG tracing and agree with the computerized printout as noted.    Midge Golas, MD 01/25/24 [phone]

## 2024-01-25 NOTE — Progress Notes (Signed)
 Received patient in bed to unit.  Alert and oriented.  Informed consent signed and in chart.   TX duration:3.5   Patient tolerated well.  Transported back to the room  Alert, without acute distress.  Hand-off given to patient's nurse.   Access used: Right AV Graft upper arm Access issues: none  Total UF removed: 3L Medication(s) given: oxycodone    01/25/24 1729  Vitals  Temp 98.1 F (36.7 C)  Temp Source Oral  BP 112/74  MAP (mmHg) 84  Pulse Rate 93  ECG Heart Rate 93  Resp 18  Oxygen Therapy  SpO2 97 %  O2 Device Room Air  During Treatment Monitoring  Duration of HD Treatment -hour(s) 3.5 hour(s)  HD Safety Checks Performed Yes  Intra-Hemodialysis Comments Tx completed  Post Treatment  Dialyzer Clearance Heavily streaked  Liters Processed 84  Fluid Removed (mL) 3000 mL  Tolerated HD Treatment Yes  AVG/AVF Arterial Site Held (minutes) 7 minutes  AVG/AVF Venous Site Held (minutes) 7 minutes  Fistula / Graft Right Upper arm Arteriovenous vein graft  Placement Date/Time: 07/06/22 1013   Placed prior to admission: No  Orientation: Right  Access Location: Upper arm  Access Type: Arteriovenous vein graft  Site Condition No complications  Fistula / Graft Assessment Present;Thrill;Bruit  Status Deaccessed;Patent  Drainage Description None     Camellia Brasil LPN [facility]

## 2024-01-25 NOTE — ED Provider Notes (Signed)
  Physical Exam  BP 123/79   Pulse 100   Temp 98.4 F (36.9 C)   Resp 20   SpO2 100%   Physical Exam  Procedures  Procedures  ED Course / MDM   Clinical Course as of 01/25/24 1814  Sat Jan 25, 2024  0631 Needs emergent dialysis for hyperkalemia. Here frequently for same.  [CP]    Clinical Course User Index [CP] Rosan Sherlean DEL, PA-C   Medical Decision Making Amount and/or Complexity of Data Reviewed Labs: ordered. Radiology: ordered. ECG/medicine tests: ordered.  Risk OTC drugs. Prescription drug management. Decision regarding hospitalization.   \ Patient returned back from dialysis session back to the ED.  Patient has no complaints at this time.  Received full session of dialysis.  No indication for repeat labs.  Will be discharged.       Simon Lavonia SAILOR, MD 01/25/24 [phone]

## 2024-01-25 NOTE — Discharge Instructions (Addendum)
 Do not miss your dialysis sessions.  This can be dangerous.  This can lead to worsening health outcomes and/or death.  Please make sure you go to schedule dialysis.

## 2024-01-26 ENCOUNTER — Emergency Department (HOSPITAL_COMMUNITY)

## 2024-01-26 ENCOUNTER — Inpatient Hospital Stay (HOSPITAL_COMMUNITY)
Admission: EM | Admit: 2024-01-26 | Discharge: 2024-01-31 | DRG: 637 | Discharge status: Against Medical Advice | Attending: Family Medicine | Admitting: Family Medicine

## 2024-01-26 ENCOUNTER — Encounter (HOSPITAL_COMMUNITY): Payer: Self-pay

## 2024-01-26 DIAGNOSIS — D631 Anemia in chronic kidney disease: Secondary | ICD-10-CM | POA: Diagnosis present

## 2024-01-26 DIAGNOSIS — R9431 Abnormal electrocardiogram [ECG] [EKG]: Secondary | ICD-10-CM | POA: Diagnosis present

## 2024-01-26 DIAGNOSIS — D72829 Elevated white blood cell count, unspecified: Secondary | ICD-10-CM | POA: Diagnosis present

## 2024-01-26 DIAGNOSIS — E872 Acidosis, unspecified: Secondary | ICD-10-CM | POA: Diagnosis present

## 2024-01-26 DIAGNOSIS — K3184 Gastroparesis: Secondary | ICD-10-CM | POA: Diagnosis present

## 2024-01-26 DIAGNOSIS — Z5329 Procedure and treatment not carried out because of patient's decision for other reasons: Secondary | ICD-10-CM | POA: Diagnosis present

## 2024-01-26 DIAGNOSIS — Z825 Family history of asthma and other chronic lower respiratory diseases: Secondary | ICD-10-CM

## 2024-01-26 DIAGNOSIS — E1010 Type 1 diabetes mellitus with ketoacidosis without coma: Principal | ICD-10-CM | POA: Diagnosis present

## 2024-01-26 DIAGNOSIS — R748 Abnormal levels of other serum enzymes: Secondary | ICD-10-CM | POA: Diagnosis present

## 2024-01-26 DIAGNOSIS — G43909 Migraine, unspecified, not intractable, without status migrainosus: Secondary | ICD-10-CM | POA: Diagnosis not present

## 2024-01-26 DIAGNOSIS — Z881 Allergy status to other antibiotic agents status: Secondary | ICD-10-CM

## 2024-01-26 DIAGNOSIS — Z5982 Transportation insecurity: Secondary | ICD-10-CM

## 2024-01-26 DIAGNOSIS — Z79899 Other long term (current) drug therapy: Secondary | ICD-10-CM

## 2024-01-26 DIAGNOSIS — N2581 Secondary hyperparathyroidism of renal origin: Secondary | ICD-10-CM | POA: Diagnosis present

## 2024-01-26 DIAGNOSIS — Z87892 Personal history of anaphylaxis: Secondary | ICD-10-CM

## 2024-01-26 DIAGNOSIS — Z555 Less than a high school diploma: Secondary | ICD-10-CM

## 2024-01-26 DIAGNOSIS — K920 Hematemesis: Secondary | ICD-10-CM | POA: Diagnosis present

## 2024-01-26 DIAGNOSIS — R739 Hyperglycemia, unspecified: Principal | ICD-10-CM

## 2024-01-26 DIAGNOSIS — N186 End stage renal disease: Secondary | ICD-10-CM

## 2024-01-26 DIAGNOSIS — Z885 Allergy status to narcotic agent status: Secondary | ICD-10-CM

## 2024-01-26 DIAGNOSIS — Z9049 Acquired absence of other specified parts of digestive tract: Secondary | ICD-10-CM

## 2024-01-26 DIAGNOSIS — Z91158 Patient's noncompliance with renal dialysis for other reason: Secondary | ICD-10-CM

## 2024-01-26 DIAGNOSIS — Z91199 Patient's noncompliance with other medical treatment and regimen due to unspecified reason: Secondary | ICD-10-CM

## 2024-01-26 DIAGNOSIS — I428 Other cardiomyopathies: Secondary | ICD-10-CM | POA: Diagnosis present

## 2024-01-26 DIAGNOSIS — K2210 Ulcer of esophagus without bleeding: Secondary | ICD-10-CM

## 2024-01-26 DIAGNOSIS — K2100 Gastro-esophageal reflux disease with esophagitis, without bleeding: Secondary | ICD-10-CM | POA: Diagnosis present

## 2024-01-26 DIAGNOSIS — Z59868 Other specified financial insecurity: Secondary | ICD-10-CM

## 2024-01-26 DIAGNOSIS — R6510 Systemic inflammatory response syndrome (SIRS) of non-infectious origin without acute organ dysfunction: Secondary | ICD-10-CM | POA: Diagnosis present

## 2024-01-26 DIAGNOSIS — I10 Essential (primary) hypertension: Secondary | ICD-10-CM | POA: Diagnosis present

## 2024-01-26 DIAGNOSIS — I132 Hypertensive heart and chronic kidney disease with heart failure and with stage 5 chronic kidney disease, or end stage renal disease: Secondary | ICD-10-CM | POA: Diagnosis present

## 2024-01-26 DIAGNOSIS — R1310 Dysphagia, unspecified: Secondary | ICD-10-CM | POA: Diagnosis present

## 2024-01-26 DIAGNOSIS — Z888 Allergy status to other drugs, medicaments and biological substances status: Secondary | ICD-10-CM

## 2024-01-26 DIAGNOSIS — Z833 Family history of diabetes mellitus: Secondary | ICD-10-CM

## 2024-01-26 DIAGNOSIS — I5042 Chronic combined systolic (congestive) and diastolic (congestive) heart failure: Secondary | ICD-10-CM | POA: Diagnosis present

## 2024-01-26 DIAGNOSIS — Z880 Allergy status to penicillin: Secondary | ICD-10-CM

## 2024-01-26 DIAGNOSIS — R160 Hepatomegaly, not elsewhere classified: Secondary | ICD-10-CM | POA: Diagnosis present

## 2024-01-26 DIAGNOSIS — I959 Hypotension, unspecified: Secondary | ICD-10-CM | POA: Diagnosis not present

## 2024-01-26 DIAGNOSIS — Z1152 Encounter for screening for COVID-19: Secondary | ICD-10-CM

## 2024-01-26 DIAGNOSIS — Z992 Dependence on renal dialysis: Secondary | ICD-10-CM

## 2024-01-26 DIAGNOSIS — F319 Bipolar disorder, unspecified: Secondary | ICD-10-CM | POA: Diagnosis present

## 2024-01-26 DIAGNOSIS — Z560 Unemployment, unspecified: Secondary | ICD-10-CM

## 2024-01-26 DIAGNOSIS — E10649 Type 1 diabetes mellitus with hypoglycemia without coma: Secondary | ICD-10-CM | POA: Diagnosis not present

## 2024-01-26 DIAGNOSIS — R1319 Other dysphagia: Secondary | ICD-10-CM

## 2024-01-26 DIAGNOSIS — E1159 Type 2 diabetes mellitus with other circulatory complications: Secondary | ICD-10-CM | POA: Diagnosis present

## 2024-01-26 DIAGNOSIS — E785 Hyperlipidemia, unspecified: Secondary | ICD-10-CM | POA: Diagnosis present

## 2024-01-26 DIAGNOSIS — E8770 Fluid overload, unspecified: Secondary | ICD-10-CM | POA: Diagnosis present

## 2024-01-26 DIAGNOSIS — E1043 Type 1 diabetes mellitus with diabetic autonomic (poly)neuropathy: Secondary | ICD-10-CM | POA: Diagnosis present

## 2024-01-26 DIAGNOSIS — E1022 Type 1 diabetes mellitus with diabetic chronic kidney disease: Secondary | ICD-10-CM | POA: Diagnosis present

## 2024-01-26 LAB — BASIC METABOLIC PANEL WITH GFR
Anion gap: 35 — ABNORMAL HIGH (ref 5–15)
BUN: 59 mg/dL — ABNORMAL HIGH (ref 6–20)
BUN: 59 mg/dL — ABNORMAL HIGH (ref 6–20)
CO2: <7 mmol/L — ABNORMAL LOW (ref 22–32)
CO2: 11 mmol/L — ABNORMAL LOW (ref 22–32)
Calcium: 8.3 mg/dL — ABNORMAL LOW (ref 8.9–10.3)
Calcium: 8.6 mg/dL — ABNORMAL LOW (ref 8.9–10.3)
Chloride: 93 mmol/L — ABNORMAL LOW (ref 98–111)
Chloride: 93 mmol/L — ABNORMAL LOW (ref 98–111)
Creatinine, Ser: 8.29 mg/dL — ABNORMAL HIGH (ref 0.44–1.00)
Creatinine, Ser: 8.41 mg/dL — ABNORMAL HIGH (ref 0.44–1.00)
GFR, Estimated: 6 mL/min — ABNORMAL LOW (ref >60)
GFR, Estimated: 6 mL/min — ABNORMAL LOW (ref >60)
Glucose, Bld: 417 mg/dL — ABNORMAL HIGH (ref 70–99)
Glucose, Bld: 339 mg/dL — ABNORMAL HIGH (ref 70–99)
Potassium: 4.3 mmol/L (ref 3.5–5.1)
Potassium: 4.1 mmol/L (ref 3.5–5.1)
Sodium: 138 mmol/L (ref 135–145)
Sodium: 139 mmol/L (ref 135–145)

## 2024-01-26 LAB — CBG MONITORING, ED
Glucose-Capillary: 547 mg/dL (ref 70–99)
Glucose-Capillary: 577 mg/dL (ref 70–99)
Glucose-Capillary: 447 mg/dL — ABNORMAL HIGH (ref 70–99)
Glucose-Capillary: 520 mg/dL (ref 70–99)
Glucose-Capillary: 466 mg/dL — ABNORMAL HIGH (ref 70–99)
Glucose-Capillary: 482 mg/dL — ABNORMAL HIGH (ref 70–99)
Glucose-Capillary: 438 mg/dL — ABNORMAL HIGH (ref 70–99)
Glucose-Capillary: 459 mg/dL — ABNORMAL HIGH (ref 70–99)

## 2024-01-26 LAB — CK: Total CK: 143 U/L (ref 38–234)

## 2024-01-26 LAB — CBC WITH DIFFERENTIAL/PLATELET
Abs Immature Granulocytes: 0.19 K/uL — ABNORMAL HIGH (ref 0.00–0.07)
Basophils Absolute: 0.1 K/uL (ref 0.0–0.1)
Basophils Relative: 0 %
Eosinophils Absolute: 0 K/uL (ref 0.0–0.5)
Eosinophils Relative: 0 %
HCT: 32.7 % — ABNORMAL LOW (ref 36.0–46.0)
Hemoglobin: 9.6 g/dL — ABNORMAL LOW (ref 12.0–15.0)
Immature Granulocytes: 1 %
Lymphocytes Relative: 13 %
Lymphs Abs: 1.9 K/uL (ref 0.7–4.0)
MCH: 27.7 pg (ref 26.0–34.0)
MCHC: 29.4 g/dL — ABNORMAL LOW (ref 30.0–36.0)
MCV: 94.5 fL (ref 80.0–100.0)
Monocytes Absolute: 0.5 K/uL (ref 0.1–1.0)
Monocytes Relative: 3 %
Neutro Abs: 12.8 K/uL — ABNORMAL HIGH (ref 1.7–7.7)
Neutrophils Relative %: 83 %
Platelets: 340 K/uL (ref 150–400)
RBC: 3.46 MIL/uL — ABNORMAL LOW (ref 3.87–5.11)
RDW: 16.7 % — ABNORMAL HIGH (ref 11.5–15.5)
WBC: 15.5 K/uL — ABNORMAL HIGH (ref 4.0–10.5)
nRBC: 0 % (ref 0.0–0.2)

## 2024-01-26 LAB — HEMOGLOBIN AND HEMATOCRIT, BLOOD
HCT: 30.8 % — ABNORMAL LOW (ref 36.0–46.0)
Hemoglobin: 8.7 g/dL — ABNORMAL LOW (ref 12.0–15.0)

## 2024-01-26 LAB — BETA-HYDROXYBUTYRIC ACID
Beta-Hydroxybutyric Acid: >8 mmol/L — ABNORMAL HIGH (ref 0.05–0.27)
Beta-Hydroxybutyric Acid: >8 mmol/L — ABNORMAL HIGH (ref 0.05–0.27)
Beta-Hydroxybutyric Acid: >8 mmol/L — ABNORMAL HIGH (ref 0.05–0.27)

## 2024-01-26 LAB — COMPREHENSIVE METABOLIC PANEL WITH GFR
ALT: 34 U/L (ref 0–44)
AST: 18 U/L (ref 15–41)
Albumin: 3.4 g/dL — ABNORMAL LOW (ref 3.5–5.0)
Alkaline Phosphatase: 558 U/L — ABNORMAL HIGH (ref 38–126)
BUN: 54 mg/dL — ABNORMAL HIGH (ref 6–20)
CO2: <7 mmol/L — ABNORMAL LOW (ref 22–32)
Calcium: 8.9 mg/dL (ref 8.9–10.3)
Chloride: 92 mmol/L — ABNORMAL LOW (ref 98–111)
Creatinine, Ser: 8.22 mg/dL — ABNORMAL HIGH (ref 0.44–1.00)
GFR, Estimated: 6 mL/min — ABNORMAL LOW (ref >60)
Glucose, Bld: 589 mg/dL (ref 70–99)
Potassium: 5.1 mmol/L (ref 3.5–5.1)
Sodium: 136 mmol/L (ref 135–145)
Total Bilirubin: 3.1 mg/dL — ABNORMAL HIGH (ref 0.0–1.2)
Total Protein: 6.2 g/dL — ABNORMAL LOW (ref 6.5–8.1)

## 2024-01-26 LAB — TYPE AND SCREEN
ABO/RH(D): A POS
Antibody Screen: NEGATIVE

## 2024-01-26 LAB — MRSA NEXT GEN BY PCR, NASAL: MRSA by PCR Next Gen: NOT DETECTED

## 2024-01-26 LAB — GLUCOSE, CAPILLARY
Glucose-Capillary: 433 mg/dL — ABNORMAL HIGH (ref 70–99)
Glucose-Capillary: 447 mg/dL — ABNORMAL HIGH (ref 70–99)
Glucose-Capillary: 410 mg/dL — ABNORMAL HIGH (ref 70–99)
Glucose-Capillary: 362 mg/dL — ABNORMAL HIGH (ref 70–99)
Glucose-Capillary: 356 mg/dL — ABNORMAL HIGH (ref 70–99)
Glucose-Capillary: 255 mg/dL — ABNORMAL HIGH (ref 70–99)
Glucose-Capillary: 233 mg/dL — ABNORMAL HIGH (ref 70–99)

## 2024-01-26 LAB — RESP PANEL BY RT-PCR (RSV, FLU A&B, COVID)  RVPGX2
Influenza A by PCR: NEGATIVE
Influenza B by PCR: NEGATIVE
Resp Syncytial Virus by PCR: NEGATIVE
SARS Coronavirus 2 by RT PCR: NEGATIVE

## 2024-01-26 LAB — I-STAT CG4 LACTIC ACID, ED: Lactic Acid, Venous: 2.7 mmol/L (ref 0.5–1.9)

## 2024-01-26 LAB — PHOSPHORUS: Phosphorus: 9.6 mg/dL — ABNORMAL HIGH (ref 2.5–4.6)

## 2024-01-26 LAB — ETHANOL: Alcohol, Ethyl (B): <15 mg/dL (ref <15)

## 2024-01-26 LAB — HCG, SERUM, QUALITATIVE: Preg, Serum: NEGATIVE

## 2024-01-26 MED ORDER — LACTATED RINGERS IV BOLUS
1000.0000 mL | Freq: Once | INTRAVENOUS | Status: AC
  Administered 2024-01-26: 1000 mL via INTRAVENOUS

## 2024-01-26 MED ORDER — HYDRALAZINE HCL 20 MG/ML IJ SOLN
10.0000 mg | INTRAMUSCULAR | Status: DC | PRN
  Administered 2024-01-26: 10 mg via INTRAVENOUS
  Filled 2024-01-26: qty 1

## 2024-01-26 MED ORDER — INSULIN REGULAR(HUMAN) IN NACL 100-0.9 UT/100ML-% IV SOLN
INTRAVENOUS | Status: DC
  Administered 2024-01-26: 11 [IU]/h via INTRAVENOUS
  Administered 2024-01-26: 5.5 [IU]/h via INTRAVENOUS
  Administered 2024-01-27: 1.6 [IU]/h via INTRAVENOUS
  Administered 2024-01-27: 0.6 [IU]/h via INTRAVENOUS
  Administered 2024-01-27: 0.7 [IU]/h via INTRAVENOUS
  Filled 2024-01-26 (×2): qty 100

## 2024-01-26 MED ORDER — BUMETANIDE 2 MG PO TABS
10.0000 mg | ORAL_TABLET | Freq: Every day | ORAL | Status: DC
Start: 2024-01-27 — End: 2024-01-27
  Filled 2024-01-26: qty 5

## 2024-01-26 MED ORDER — PANTOPRAZOLE SODIUM 40 MG IV SOLR
40.0000 mg | Freq: Once | INTRAVENOUS | Status: AC
  Administered 2024-01-26: 40 mg via INTRAVENOUS
  Filled 2024-01-26: qty 10

## 2024-01-26 MED ORDER — LAMOTRIGINE 100 MG PO TABS
200.0000 mg | ORAL_TABLET | Freq: Every day | ORAL | Status: DC
  Administered 2024-01-27 – 2024-01-31 (×5): 200 mg via ORAL
  Filled 2024-01-26 (×2): qty 2
  Filled 2024-01-26: qty 8
  Filled 2024-01-26 (×3): qty 2

## 2024-01-26 MED ORDER — CALCITRIOL 0.25 MCG PO CAPS
1.2500 ug | ORAL_CAPSULE | ORAL | Status: DC
  Administered 2024-01-27 – 2024-01-31 (×3): 1.25 ug via ORAL
  Filled 2024-01-26 (×3): qty 1

## 2024-01-26 MED ORDER — SACUBITRIL-VALSARTAN 24-26 MG PO TABS
1.0000 | ORAL_TABLET | Freq: Two times a day (BID) | ORAL | Status: DC
  Administered 2024-01-27 – 2024-01-28 (×4): 1 via ORAL
  Filled 2024-01-26 (×5): qty 1

## 2024-01-26 MED ORDER — ACETAMINOPHEN 325 MG PO TABS: 650.0000 mg | ORAL_TABLET | Freq: Four times a day (QID) | ORAL | Status: DC | PRN

## 2024-01-26 MED ORDER — LACTATED RINGERS IV SOLN: INTRAVENOUS | Status: DC

## 2024-01-26 MED ORDER — SODIUM BICARBONATE 650 MG PO TABS
650.0000 mg | ORAL_TABLET | Freq: Two times a day (BID) | ORAL | Status: DC
  Administered 2024-01-27 – 2024-01-31 (×9): 650 mg via ORAL
  Filled 2024-01-26 (×10): qty 1

## 2024-01-26 MED ORDER — PANTOPRAZOLE SODIUM 40 MG IV SOLR
40.0000 mg | Freq: Two times a day (BID) | INTRAVENOUS | Status: DC
  Administered 2024-01-26 – 2024-01-29 (×7): 40 mg via INTRAVENOUS
  Filled 2024-01-26 (×7): qty 10

## 2024-01-26 MED ORDER — CARVEDILOL 25 MG PO TABS
25.0000 mg | ORAL_TABLET | Freq: Two times a day (BID) | ORAL | Status: DC
Start: 2024-01-27 — End: 2024-01-29
  Administered 2024-01-27 – 2024-01-28 (×4): 25 mg via ORAL
  Filled 2024-01-26: qty 1
  Filled 2024-01-26 (×2): qty 2
  Filled 2024-01-26: qty 1

## 2024-01-26 MED ORDER — ALBUTEROL SULFATE (2.5 MG/3ML) 0.083% IN NEBU: 2.5000 mg | INHALATION_SOLUTION | Freq: Four times a day (QID) | RESPIRATORY_TRACT | Status: DC | PRN

## 2024-01-26 MED ORDER — FENTANYL CITRATE PF 50 MCG/ML IJ SOSY
25.0000 ug | PREFILLED_SYRINGE | INTRAMUSCULAR | Status: DC | PRN
  Administered 2024-01-27 – 2024-01-28 (×6): 25 ug via INTRAVENOUS
  Filled 2024-01-26 (×5): qty 1

## 2024-01-26 MED ORDER — DEXTROSE 50 % IV SOLN
0.0000 mL | INTRAVENOUS | Status: DC | PRN
  Administered 2024-01-28: 50 mL via INTRAVENOUS
  Filled 2024-01-26 (×2): qty 50

## 2024-01-26 MED ORDER — DULOXETINE HCL 20 MG PO CPEP
20.0000 mg | ORAL_CAPSULE | Freq: Every day | ORAL | Status: DC
  Administered 2024-01-27 – 2024-01-31 (×5): 20 mg via ORAL
  Filled 2024-01-26 (×5): qty 1

## 2024-01-26 MED ORDER — TRIMETHOBENZAMIDE HCL 100 MG/ML IM SOLN
200.0000 mg | Freq: Four times a day (QID) | INTRAMUSCULAR | Status: DC | PRN
  Administered 2024-01-27: 200 mg via INTRAMUSCULAR
  Filled 2024-01-26 (×2): qty 2

## 2024-01-26 MED ORDER — ROSUVASTATIN CALCIUM 20 MG PO TABS
40.0000 mg | ORAL_TABLET | Freq: Every day | ORAL | Status: DC
  Administered 2024-01-27 – 2024-01-31 (×5): 40 mg via ORAL
  Filled 2024-01-26 (×5): qty 2

## 2024-01-26 MED ORDER — DEXTROSE IN LACTATED RINGERS 5 % IV SOLN: INTRAVENOUS | Status: DC

## 2024-01-26 MED ORDER — ACETAMINOPHEN 650 MG RE SUPP: 650.0000 mg | Freq: Four times a day (QID) | RECTAL | Status: DC | PRN

## 2024-01-26 MED ORDER — OXYCODONE-ACETAMINOPHEN 5-325 MG PO TABS
1.0000 | ORAL_TABLET | ORAL | Status: DC | PRN
  Administered 2024-01-28 – 2024-01-29 (×2): 1 via ORAL
  Filled 2024-01-26 (×4): qty 1

## 2024-01-26 MED ORDER — SODIUM CHLORIDE 0.9% FLUSH
3.0000 mL | Freq: Two times a day (BID) | INTRAVENOUS | Status: DC
  Administered 2024-01-26 – 2024-01-30 (×8): 3 mL via INTRAVENOUS

## 2024-01-26 NOTE — ED Triage Notes (Signed)
 Pt presents to ED via EMS from home with complaint of vomiting black stuff, no black stool began last night. Weakness. Dialyzed yesterday, missed an appointment or two due to transportation issues.   CBG HI

## 2024-01-26 NOTE — ED Notes (Signed)
 CCMD contacted.

## 2024-01-26 NOTE — ED Provider Notes (Signed)
 [place] EMERGENCY DEPARTMENT AT [facility] Provider Note   CSN: [id number] Arrival date & time: 01/26/24  1202     Patient presents with: Weakness and Hematemesis   Ashley Freeman is a 33 y.o. female with history of ESRD on HD (MWF), HFrEF, type I DM, hypertension, hyperlipidemia, DKA presents with complaints of weakness.  Brought in via EMS.  Mom reports that she  vomited black stuff.  Patient is an overall poor historian and does not engage in exam.  Repeatedly states  I hurt all over.  Reportedly last dialysis was yesterday and was completed session.    Weakness     Past Medical History:  Diagnosis Date   Abscess, gluteal, right 08/24/2013   Anemia 02/19/2012   Bartholin's gland abscess 09/19/2013   Bipolar disorder (HCC)    BV (bacterial vaginosis) 11/24/2015   Depression    Diabetes mellitus type I (HCC) 2001   Diagnosed at age 39 ; Type I   Diarrhea 05/30/2016   DKA (diabetic ketoacidoses) 08/19/2013   Also in 2018   ESRD (end stage renal disease) (HCC)    Gonorrhea 08/2011   Treated in 09/2011   HFrEF (heart failure with reduced ejection fraction) (HCC)    a. 2022 Echo: EF 40%; b. 10/2021 Echo: EF 55%; b. 07/2022 MV: No ischemia. EF 31%; c. 08/2022 Echo: EF 35%, mildly dil RV, sev TR.   History of trichomoniasis 05/31/2016   Hyperlipidemia 03/28/2016   Hypertension    NICM (nonischemic cardiomyopathy) (HCC)    Sepsis (HCC) 09/19/2013   Past Surgical History:  Procedure Laterality Date   A/V FISTULAGRAM Right 06/17/2023   Procedure: A/V Fistulagram;  Surgeon: Marea Selinda RAMAN, MD;  Location: [facility] INVASIVE CV LAB;  Service: Cardiovascular;  Laterality: Right;   A/V SHUNT INTERVENTION N/A 09/25/2023   Procedure: A/V SHUNT INTERVENTION;  Surgeon: Pearline Norman RAMAN, MD;  Location: [facility] PV LAB;  Service: Cardiovascular;  Laterality: N/A;   AV FISTULA PLACEMENT Right 07/06/2022   Procedure: ARTERIOVENOUS GRAFT CREATION;  Surgeon: Gretta Lonni PARAS, MD;  Location: [facility] OR;  Service: Vascular;  Laterality: Right;   CESAREAN SECTION N/A 10/05/2019   Procedure: CESAREAN SECTION;  Surgeon: Izell Harari, MD;  Location: [facility] LD ORS;  Service: Obstetrics;  Laterality: N/A;   CHOLECYSTECTOMY N/A 07/02/2023   Procedure: LAPAROSCOPIC CHOLECYSTECTOMY;  Surgeon: Ebbie Cough, MD;  Location: [facility] OR;  Service: General;  Laterality: N/A;   INCISION AND DRAINAGE ABSCESS Left 09/28/2019   Procedure: INCISION AND DRAINAGE VULVAR ABCESS;  Surgeon: Edsel Norleen GAILS, MD;  Location: [facility] OR;  Service: Gynecology;  Laterality: Left;   INCISION AND DRAINAGE PERIRECTAL ABSCESS Right 08/18/2013   Procedure: IRRIGATION AND DEBRIDEMENT GLUTEAL ABSCESS;  Surgeon: Lynda Leos, MD;  Location: [facility] OR;  Service: General;  Laterality: Right;   INCISION AND DRAINAGE PERIRECTAL ABSCESS Right 09/19/2013   Procedure: IRRIGATION AND DEBRIDEMENT RIGHT GLUTEAL AND LABIAL ABSCESSES;  Surgeon: Lynda Leos, MD;  Location: [facility] OR;  Service: General;  Laterality: Right;   INCISION AND DRAINAGE PERIRECTAL ABSCESS Right 09/24/2013   Procedure: IRRIGATION AND DEBRIDEMENT PERIRECTAL ABSCESS;  Surgeon: Lynwood MALVA Pina, MD;  Location: [facility] OR;  Service: General;  Laterality: Right;   IR PARACENTESIS  08/28/2023   IR PARACENTESIS  11/04/2023   IR PARACENTESIS  12/23/2023     Prior to Admission medications   Medication Sig Start Date End Date Taking? Authorizing Provider  albuterol  (VENTOLIN  HFA) 108 (90 Base) MCG/ACT inhaler Inhale 2 puffs into the  lungs every 4 (four) hours as needed for wheezing or shortness of breath. 11/24/22   [provider]  bumetanide  (BUMEX ) 2 MG tablet Take 10 mg by mouth daily.    [provider]  calcitRIOL  (ROCALTROL ) 0.25 MCG capsule Take 5 capsules (1.25 mcg total) by mouth every Tuesday, Thursday, and Saturday at 6 PM. Patient taking differently: Take 1.25 mcg by mouth every Monday, Wednesday, and Friday. 07/09/23   Cindy Garnette POUR, MD  carvedilol  (COREG ) 25 MG tablet Take 25 mg by mouth 2 (two) times daily with a meal.    [provider]  DULoxetine  (CYMBALTA ) 20 MG capsule Take 20 mg by mouth daily. 07/29/23   [provider]  famotidine  (PEPCID ) 20 MG tablet Take 20 mg by mouth daily. 07/29/23   [provider]  fluticasone  (FLONASE ) 50 MCG/ACT nasal spray Place 2 sprays into both nostrils daily. 12/11/23 12/10/24  [provider]  hydrOXYzine  (ATARAX ) 25 MG tablet Take 25 mg by mouth 3 (three) times daily as needed for anxiety, itching, nausea or vomiting.    [provider]  insulin  aspart (NOVOLOG ) 100 UNIT/ML injection Inject 5 Units into the skin 3 (three) times daily with meals.    [provider]  ketoconazole  (NIZORAL ) 2 % cream Apply 1 Application topically 2 (two) times daily. Apply 1gm twice daily to affected skin on feet 01/06/24   McCaughan, Dia D, DPM  lamoTRIgine  (LAMICTAL ) 200 MG tablet Take 1 tablet (200 mg total) by mouth daily. 10/29/23   Regalado, Belkys A, MD  LANTUS  SOLOSTAR 100 UNIT/ML Solostar Pen Inject 10 Units into the skin daily.    [provider]  OLANZapine  zydis (ZYPREXA ) 5 MG disintegrating tablet Take 5 mg by mouth at bedtime. 09/23/23 02/08/24  [provider]  omeprazole  (PRILOSEC) 40 MG capsule Take 40 mg by mouth daily as needed (for acid refulx). 12/11/23 02/09/24  [provider]  ondansetron  (ZOFRAN -ODT) 4 MG disintegrating tablet Take 1 tablet (4 mg total) by mouth every 8 (eight) hours as needed for nausea or vomiting. 06/23/23   Davis, Jonathon H, MD  oxyCODONE -acetaminophen  (PERCOCET/ROXICET) 5-325 MG tablet Take 1 tablet by mouth every 4 (four) hours as needed for moderate pain (pain score 4-6). 01/20/24   [provider]  Pancrelipase , Lip-Prot-Amyl, 3000-9500 units CPEP Take 3,000 units of lipase by mouth 3 (three) times daily before meals. 09/23/23 02/08/24  [provider]   prochlorperazine  (COMPAZINE ) 5 MG tablet Take 5 mg by mouth every 6 (six) hours as needed for nausea or vomiting.    [provider]  rosuvastatin  (CRESTOR ) 40 MG tablet Take 40 mg by mouth daily. 01/20/24 01/19/25  [provider]  sacubitril-valsartan (ENTRESTO) 24-26 MG Take 1 tablet by mouth 2 (two) times daily.    [provider]  sodium bicarbonate  650 MG tablet Take 650 mg by mouth 2 (two) times daily. 07/29/23   [provider]  SUMAtriptan  (IMITREX ) 50 MG tablet Take 50 mg by mouth every 2 (two) hours as needed for migraine or headache. 06/20/22   [provider]    Allergies: Keflex  [cephalexin ], Penicillins, Vibramycin  [doxycycline ], Benadryl  [diphenhydramine ], Dilaudid  [hydromorphone ], Methotrexate derivatives, and Roxicodone  [oxycodone ]    Review of Systems  Neurological:  Positive for weakness.    Updated Vital Signs BP 129/83   Pulse 83   Temp (!) 97.3 F (36.3 C)   Resp (!) 23   Ht 5' 3 (1.6 m)   SpO2 100%   BMI 24.95  kg/m   Physical Exam Vitals and nursing note reviewed.  Constitutional:      Appearance: She is well-developed.     Comments: Patient is lethargic, appearing to be in no acute distress  HENT:     Head: Normocephalic and atraumatic.  Eyes:     Conjunctiva/sclera: Conjunctivae normal.  Cardiovascular:     Rate and Rhythm: Normal rate and regular rhythm.     Heart sounds: No murmur heard. Pulmonary:     Effort: Pulmonary effort is normal. No respiratory distress.     Breath sounds: Normal breath sounds.  Abdominal:     Palpations: Abdomen is soft.     Tenderness: There is abdominal tenderness.     Comments: Generalized abdominal tenderness  Musculoskeletal:        General: No swelling.     Cervical back: Neck supple.     Comments: Endorses generalized tenderness without focality, worse abnormalities or swelling  Skin:    General: Skin is warm and dry.     Capillary Refill: Capillary refill takes  less than 2 seconds.  Neurological:     Comments:  opens eyes spontaneously, moves extremities spontaneously, There is no abnormal phonation. Symmetric smile without facial droop . There is no nystagmus. EOMI, PERRL.    Psychiatric:        Mood and Affect: Mood normal.     (all labs ordered are listed, but only abnormal results are displayed) Labs Reviewed  CBC WITH DIFFERENTIAL/PLATELET - Abnormal; Notable for the following components:      Result Value   WBC 15.5 (*)    RBC 3.46 (*)    Hemoglobin 9.6 (*)    HCT 32.7 (*)    MCHC 29.4 (*)    RDW 16.7 (*)    Neutro Abs 12.8 (*)    Abs Immature Granulocytes 0.19 (*)    All other components within normal limits  COMPREHENSIVE METABOLIC PANEL WITH GFR - Abnormal; Notable for the following components:   Chloride 92 (*)    CO2 <7 (*)    Glucose, Bld 589 (*)    BUN 54 (*)    Creatinine, Ser 8.22 (*)    Total Protein 6.2 (*)    Albumin  3.4 (*)    Alkaline Phosphatase 558 (*)    Total Bilirubin 3.1 (*)    GFR, Estimated 6 (*)    All other components within normal limits  BETA-HYDROXYBUTYRIC ACID - Abnormal; Notable for the following components:   Beta-Hydroxybutyric Acid >8.00 (*)    All other components within normal limits  PHOSPHORUS - Abnormal; Notable for the following components:   Phosphorus 9.6 (*)    All other components within normal limits  I-STAT CG4 LACTIC ACID, ED - Abnormal; Notable for the following components:   Lactic Acid, Venous 2.7 (*)    All other components within normal limits  CBG MONITORING, ED - Abnormal; Notable for the following components:   Glucose-Capillary 547 (*)    All other components within normal limits  CBG MONITORING, ED - Abnormal; Notable for the following components:   Glucose-Capillary 577 (*)    All other components within normal limits  CBG MONITORING, ED - Abnormal; Notable for the following components:   Glucose-Capillary 520 (*)    All other components within normal limits   CBG MONITORING, ED - Abnormal; Notable for the following components:   Glucose-Capillary 447 (*)    All other components within normal limits  RESP PANEL BY RT-PCR (RSV, FLU A&B,  COVID)  [id number]  CULTURE, BLOOD (ROUTINE X 2)  CULTURE, BLOOD (ROUTINE X 2)  HCG, SERUM, QUALITATIVE  ETHANOL  CK  RAPID URINE DRUG SCREEN, HOSP PERFORMED  BETA-HYDROXYBUTYRIC ACID  BETA-HYDROXYBUTYRIC ACID  BETA-HYDROXYBUTYRIC ACID  BASIC METABOLIC PANEL WITH GFR  BASIC METABOLIC PANEL WITH GFR  BASIC METABOLIC PANEL WITH GFR  I-STAT VENOUS BLOOD GAS, ED  I-STAT CG4 LACTIC ACID, ED    EKG: EKG Interpretation Date/Time:  Sunday January 26 2024 12:13:15 EDT Ventricular Rate:  83 PR Interval:  125 QRS Duration:  98 QT Interval:  462 QTC Calculation: 543 R Axis:   63  Text Interpretation: Sinus rhythm Probable left atrial enlargement Prolonged QT interval No significant change since last tracing Confirmed by Randol Simmonds [phone]) on 01/26/2024 12:18:57 PM  Radiology:    Procedures   Medications Ordered in the ED  insulin  regular, human (MYXREDLIN ) 100 units/ 100 mL infusion (4 Units/hr Intravenous Rate/Dose Change 01/26/24 1509)  dextrose  5 % in lactated ringers  infusion (0 mLs Intravenous Hold 01/26/24 1353)  dextrose  50 % solution 0-50 mL (has no administration in time range)  pantoprazole  (PROTONIX ) injection 40 mg (has no administration in time range)  lactated ringers  bolus 1,000 mL (1,000 mLs Intravenous New Bag/Given 01/26/24 1321)    Clinical Course as of 01/26/24 1516  Sun Jan 26, 2024  1230 Patient with history of ESRD on HD, DM evaluated for complaints of weakness and reportedly  vomiting black stuff.  Patient is hemodynamically stable.  On exam she is lethargic, GCS 14.  She endorses generalized tenderness to her extremities chest and abdomen.  Exam is overall limited.  Will begin broad workup. [JT]  1243 EKG 12-Lead Sinus rhythm, prolonged QT [JT]  1341 POC CBG, ED(!!) Glucose 547  [JT]  1341 I-Stat CG4 Lactic Acid(!!) Lactic 2.7 [JT]  1341 CK Within normal limit [JT]  1341 Comprehensive metabolic panel(!!) Glucose 589, creatinine 8.22 in the setting of ESRD, alk phos of 558 near baseline [JT]  1342 CBC with Differential(!) Leukocytosis of 15.5, hemoglobin of 9.6, hemoglobin of 12 2 days ago, however 9 few days ago [JT]  1342 DG Chest Portable 1 View No acute process [JT]  1415 Declines Hemoccult [JT]  1445 Patient requires admission for DKA. Is receiving fluids and insulin  [JT]  1500 Beta-hydroxybutyric acid(!) Notably elevated [JT]  1500 Resp panel by RT-PCR (RSV, Flu A&B, Covid) Anterior Nasal Swab Negative [JT]  1501 VBG with pH of 7.2 [JT]  1515 Discussed patient with hospitalist Dr. Claudene.  Agreed for admission [JT]    Clinical Course User Index [JT] Donnajean Lynwood DEL, PA-C                                 Medical Decision Making Amount and/or Complexity of Data Reviewed Labs: ordered. Decision-making details documented in ED Course. Radiology: ordered. Decision-making details documented in ED Course. ECG/medicine tests:  Decision-making details documented in ED Course.  Risk Prescription drug management.   This patient presents to the ED with chief complaint(s) of weakness.  The complaint involves an extensive differential diagnosis and also carries with it a high risk of complications and morbidity.   Pertinent past medical history as listed in HPI  The differential diagnosis includes  DKA, ESRD needing HD, URI, pneumonia, acute abdomen  Additional history obtained: Additional history obtained from family and EMS  Records reviewed Care Everywhere/External Records  Disposition:   Patient will be  admitted for DKA.  Social Determinants of Health:   none  This note was dictated with voice recognition software.  Despite best efforts at proofreading, errors may have occurred which can change the documentation meaning.       Final  diagnoses:  Hyperglycemia  Hematemesis, unspecified whether nausea present    ED Discharge Orders     None          Donnajean Lynwood VEAR DEVONNA 01/26/24 1516    Randol Simmonds, MD 01/27/24 [phone]

## 2024-01-26 NOTE — ED Notes (Signed)
Patient cleaned up and bedding changed.

## 2024-01-26 NOTE — H&P (Signed)
 History and Physical    Patient: Ashley Freeman DOB: [birth year] DOA: 01/26/2024 DOS: the patient was seen and examined on 01/26/2024 PCP: Keven Crumbly Pap, MD  Patient coming from: Home  Chief Complaint:  Chief Complaint  Patient presents with   Weakness   Hematemesis   HPI: Ashley Freeman is a 33 y.o. female with medical history significant of HTN, HLD, ESRD on HD (MWF), HFrEF, uncontrolled DM type I, bipolar disorder, and noncompliance with multiple admissions for DKA and/or fluid overload presents with complaints of of nausea and vomiting.  She had just recently been in the ED from 9/26-927 with complaints of shortness of breath after missing her Wednesday and Friday dialysis sessions.  Nephrology was consulted and patient was dialyzed with improvement in symptoms prior to being discharged home.  However, she has been experiencing vomiting since last night.  Reported emesis to be dark, possibly black in color.  In addition to vomiting reported dark stools, weakness, and pain all over.  She does still report making some urine.     In the ED patient was noted to be afebrile with mild tachypnea vital signs otherwise maintained.  Labs significant for WBC 15.5, hemoglobin 9 6, glucose 589, CO2 less than 7, BUN 54, creatinine 8.22, anion gap unable to be calculated, alkaline phosphatase 558, total bilirubin 3.1, beta hydroxybutyrate acid level greater than 8, and lactic acid 2.7.  Patient had been given 1 L of lactated Ringer 's and Protonix  40 mg IV.  Review of Systems: As mentioned in the history of present illness. All other systems reviewed and are negative. Past Medical History:  Diagnosis Date   Abscess, gluteal, right 08/24/2013   Anemia 02/19/2012   Bartholin's gland abscess 09/19/2013   Bipolar disorder (HCC)    BV (bacterial vaginosis) 11/24/2015   Depression    Diabetes mellitus type I (HCC) 2001   Diagnosed at age 78 ; Type I    Diarrhea 05/30/2016   DKA (diabetic ketoacidoses) 08/19/2013   Also in 2018   ESRD (end stage renal disease) (HCC)    Gonorrhea 08/2011   Treated in 09/2011   HFrEF (heart failure with reduced ejection fraction) (HCC)    a. 2022 Echo: EF 40%; b. 10/2021 Echo: EF 55%; b. 07/2022 MV: No ischemia. EF 31%; c. 08/2022 Echo: EF 35%, mildly dil RV, sev TR.   History of trichomoniasis 05/31/2016   Hyperlipidemia 03/28/2016   Hypertension    NICM (nonischemic cardiomyopathy) (HCC)    Sepsis (HCC) 09/19/2013   Past Surgical History:  Procedure Laterality Date   A/V FISTULAGRAM Right 06/17/2023   Procedure: A/V Fistulagram;  Surgeon: Marea Selinda GORMAN, MD;  Location: [facility] INVASIVE [facility] LAB;  Service: Cardiovascular;  Laterality: Right;   A/V SHUNT INTERVENTION N/A 09/25/2023   Procedure: A/V SHUNT INTERVENTION;  Surgeon: Pearline Norman GORMAN, MD;  Location: [facility];  Service: Cardiovascular;  Laterality: N/A;   AV FISTULA PLACEMENT Right 07/06/2022   Procedure: ARTERIOVENOUS GRAFT CREATION;  Surgeon: Gretta Lonni PARAS, MD;  Location: [facility];  Service: Vascular;  Laterality: Right;   CESAREAN SECTION N/A 10/05/2019   Procedure: CESAREAN SECTION;  Surgeon: Izell Harari, MD;  Location: [facility] LD ORS;  Service: Obstetrics;  Laterality: N/A;   CHOLECYSTECTOMY N/A 07/02/2023   Procedure: LAPAROSCOPIC CHOLECYSTECTOMY;  Surgeon: Ebbie Cough, MD;  Location: [facility] OR;  Service: General;  Laterality: N/A;   INCISION AND DRAINAGE ABSCESS Left 09/28/2019   Procedure: INCISION AND DRAINAGE VULVAR ABCESS;  Surgeon: Edsel Rush  V, MD;  Location: [facility] OR;  Service: Gynecology;  Laterality: Left;   INCISION AND DRAINAGE PERIRECTAL ABSCESS Right 08/18/2013   Procedure: IRRIGATION AND DEBRIDEMENT GLUTEAL ABSCESS;  Surgeon: Lynda Leos, MD;  Location: [facility] OR;  Service: General;  Laterality: Right;   INCISION AND DRAINAGE PERIRECTAL ABSCESS Right 09/19/2013   Procedure: IRRIGATION AND DEBRIDEMENT RIGHT GLUTEAL AND  LABIAL ABSCESSES;  Surgeon: Lynda Leos, MD;  Location: [facility] OR;  Service: General;  Laterality: Right;   INCISION AND DRAINAGE PERIRECTAL ABSCESS Right 09/24/2013   Procedure: IRRIGATION AND DEBRIDEMENT PERIRECTAL ABSCESS;  Surgeon: Lynwood MALVA Pina, MD;  Location: [facility] OR;  Service: General;  Laterality: Right;   IR PARACENTESIS  08/28/2023   IR PARACENTESIS  11/04/2023   IR PARACENTESIS  12/23/2023   Social History:  reports that she has never smoked. She has never been exposed to tobacco smoke. She has never used smokeless tobacco. She reports that she does not currently use alcohol. She reports that she does not use drugs.  Allergies  Allergen Reactions   Keflex  [Cephalexin ] Anaphylaxis    Ceftriaxone  in the past with no reaction   Penicillins Anaphylaxis, Hives and Rash   Vibramycin  [Doxycycline ] Anaphylaxis   Benadryl  [Diphenhydramine ] Itching   Dilaudid  [Hydromorphone ] Itching   Methotrexate Derivatives Rash   Roxicodone  [Oxycodone ] Itching    Family History  Problem Relation Age of Onset   Asthma Mother    Carpal tunnel syndrome Mother    Gout Father    Diabetes Paternal Grandmother    Anesthesia problems Neg Hx     Prior to Admission medications   Medication Sig Start Date End Date Taking? Authorizing Provider  albuterol  (VENTOLIN  HFA) 108 (90 Base) MCG/ACT inhaler Inhale 2 puffs into the lungs every 4 (four) hours as needed for wheezing or shortness of breath. 11/24/22   [provider]  bumetanide  (BUMEX ) 2 MG tablet Take 10 mg by mouth daily.    [provider]  calcitRIOL  (ROCALTROL ) 0.25 MCG capsule Take 5 capsules (1.25 mcg total) by mouth every Tuesday, Thursday, and Saturday at 6 PM. Patient taking differently: Take 1.25 mcg by mouth every Monday, Wednesday, and Friday. 07/09/23   Cindy Garnette POUR, MD  carvedilol  (COREG ) 25 MG tablet Take 25 mg by mouth 2 (two) times daily with a meal.    [provider]  DULoxetine  (CYMBALTA ) 20 MG capsule Take  20 mg by mouth daily. 07/29/23   [provider]  famotidine  (PEPCID ) 20 MG tablet Take 20 mg by mouth daily. 07/29/23   [provider]  fluticasone  (FLONASE ) 50 MCG/ACT nasal spray Place 2 sprays into both nostrils daily. 12/11/23 12/10/24  [provider]  hydrOXYzine  (ATARAX ) 25 MG tablet Take 25 mg by mouth 3 (three) times daily as needed for anxiety, itching, nausea or vomiting.    [provider]  insulin  aspart (NOVOLOG ) 100 UNIT/ML injection Inject 5 Units into the skin 3 (three) times daily with meals.    [provider]  ketoconazole  (NIZORAL ) 2 % cream Apply 1 Application topically 2 (two) times daily. Apply 1gm twice daily to affected skin on feet 01/06/24   McCaughan, Dia D, DPM  lamoTRIgine  (LAMICTAL ) 200 MG tablet Take 1 tablet (200 mg total) by mouth daily. 10/29/23   Regalado, Belkys A, MD  LANTUS  SOLOSTAR 100 UNIT/ML Solostar Pen Inject 10 Units into the skin daily.    [provider]  OLANZapine  zydis (ZYPREXA ) 5 MG disintegrating tablet Take 5 mg by mouth at bedtime. 09/23/23  02/08/24  [provider]  omeprazole  (PRILOSEC) 40 MG capsule Take 40 mg by mouth daily as needed (for acid refulx). 12/11/23 02/09/24  [provider]  ondansetron  (ZOFRAN -ODT) 4 MG disintegrating tablet Take 1 tablet (4 mg total) by mouth every 8 (eight) hours as needed for nausea or vomiting. 06/23/23   Davis, Jonathon H, MD  oxyCODONE -acetaminophen  (PERCOCET/ROXICET) 5-325 MG tablet Take 1 tablet by mouth every 4 (four) hours as needed for moderate pain (pain score 4-6). 01/20/24   [provider]  Pancrelipase , Lip-Prot-Amyl, 3000-9500 units CPEP Take 3,000 units of lipase by mouth 3 (three) times daily before meals. 09/23/23 02/08/24  [provider]  prochlorperazine  (COMPAZINE ) 5 MG tablet Take 5 mg by mouth every 6 (six) hours as needed for nausea or vomiting.    [provider]  rosuvastatin  (CRESTOR ) 40 MG tablet  Take 40 mg by mouth daily. 01/20/24 01/19/25  [provider]  sacubitril-valsartan (ENTRESTO) 24-26 MG Take 1 tablet by mouth 2 (two) times daily.    [provider]  sodium bicarbonate  650 MG tablet Take 650 mg by mouth 2 (two) times daily. 07/29/23   [provider]  SUMAtriptan  (IMITREX ) 50 MG tablet Take 50 mg by mouth every 2 (two) hours as needed for migraine or headache. 06/20/22   [provider]    Physical Exam: Vitals:   01/26/24 1209 01/26/24 1215 01/26/24 1309 01/26/24 1415  BP: 132/68 125/73  129/83  Pulse: 82 82  83  Resp: (!) 24 (!) 25  (!) 23  Temp:   (!) 97.3 F (36.3 C)   SpO2: 100% 100%  100%  Height:        Constitutional: Female who appears chronically ill Eyes: PERRL, lids and conjunctivae normal ENMT: Mucous membranes are dry.  Fair dentition. Neck: normal, supple  Respiratory: clear to auscultation bilaterally, no wheezing, no crackles. Normal respiratory effort.   Cardiovascular: Regular rate and rhythm, no murmurs / rubs / gallops. No extremity edema. 2+ pedal pulses.  Fistula present with palpable thrill of the right upper extremity. Abdomen: Mild tenderness to palpation but no guarding appreciated. Bowel sounds positive.  Musculoskeletal: no clubbing / cyanosis. No joint deformity upper and lower extremities. Good ROM, no contractures.  Generalized tenderness to palpation when checking extremities noted Skin: no rashes, lesions, ulcers. No induration Neurologic: CN 2-12 grossly intact.  Patient able to move all extremities but globally weak. Psychiatric: Lethargic, but oriented x 3.  Data Reviewed:  EKG revealed sinus rhythm at 83 bpm with QTc 543.  Reviewed labs, imaging, and pertinent records as documented.  Assessment and Plan:  DKA, type I Acute.  Patient presented with complaints of nausea, vomiting, and pain all over.  On admission labs glucose 589, CO2 less than 7, BUN 54, creatinine 8.22, anion gap unable to be  calculated, and beta hydroxybutyrate acid level greater than 8.  Patient had been bolused 1 L of IV fluids and started on insulin  drip. - Admit to progressive bed - Hyperglycemia order set utilized - Check urinalysis and venous pH once able - Continue insulin  drip per protocol - Decreased lactated Ringer  IV fluids at 50 mL/h - Correcting electrolyte abnormalities as needed - Serial BMPs every 4 hours - Monitoring for closure of anion gap to transition back to subcu insulin   SIRS Lactic acidosis Acute.  On admission patient was noted to be tachypneic with white blood cell count elevated up to 15.5 meeting SIRS criteria.  Chest x-ray showed no acute  abnormality.  Lactic acid noted to be elevated at 2.7.  Blood cultures were obtained.  She was bolused 1 L of lactated Ringer 's.  Possibly reactive to above similar presentation noted during multiple hospitalizations. - Follow-up blood cultures - Follow-up urinalysis - Trend lactic acid levels - Rechecked WBC in a.m. - Low threshold to start antibiotics.  Hematemesis with nausea Acute.  Patient presents with nausea and vomiting reported having dark emesis as well as dark stools.  Hemoglobin appears fairly around patient's baseline at 9.6.  Possibly secondary to gastritis. - Aspiration precautions with elevation head of bed - Check stool guaiac - Continue to monitor H&H - Type and screen for possible need of blood products - Protonix  40 mg IV twice daily  Anemia of chronic disease Hemoglobin noted to be 9.6 which appears around most recent baseline based off prior records where hemoglobin appears to range around 9-10.  Despite reports of hematemesis. - Continue to H&H  Prolonged QT interval Chronic.  QTc 543. - Correct electrolyte abnormalities - Avoid QT prolonging medication  ESRD on HD Patient is on a Monday, Wednesday, Friday schedule but missed her last 2 sessions when she came to the emergency department on 9/26 and was dialyzed on  the 27th.  Labs note potassium 5.1, CO2 less than 7, BUN 54, creatinine 8.22. - Nephrology consulted for need of dialysis likely in AM.  Heart failure with reduced ejection fraction Patient appears to be euvolemic at this time.  Chest x-ray noted no acute abnormality.  Last echocardiogram noted EF to be 30 to 35% with grade 2 diastolic dysfunction when checked 08/29/2023.   - Continue beta-blocker, Entresto, and Bumex  once able - Fluid management with HD  Essential hypertension Blood pressures noted to be elevated up to 171/79. - Continue Bumex , Coreg , Entresto  Bipolar disorder - Continue Cymbalta    DVT prophylaxis: SCDs Advance Care Planning:   Code Status: Full Code    Consults: None  Family Communication: Attempted to call patient's mother and left a voicemail  Severity of Illness: The appropriate patient status for this patient is OBSERVATION. Observation status is judged to be reasonable and necessary in order to provide the required intensity of service to ensure the patient's safety. The patient's presenting symptoms, physical exam findings, and initial radiographic and laboratory data in the context of their medical condition is felt to place them at decreased risk for further clinical deterioration. Furthermore, it is anticipated that the patient will be medically stable for discharge from the hospital within 2 midnights of admission.   Author: Maximino DELENA Sharps, MD 01/26/2024 3:08 PM  For on call review [email or web address].

## 2024-01-26 NOTE — ED Notes (Signed)
 [place] PA and this RN at bedside to complete rectal exam. Pt refusing rectal exam from provider even after educating on importance of testing. Pt states If I'm not being put to sleep, y'all not doing it.

## 2024-01-27 ENCOUNTER — Encounter: Admitting: Obstetrics and Gynecology

## 2024-01-27 ENCOUNTER — Other Ambulatory Visit: Payer: Self-pay

## 2024-01-27 DIAGNOSIS — Z888 Allergy status to other drugs, medicaments and biological substances status: Secondary | ICD-10-CM | POA: Diagnosis not present

## 2024-01-27 DIAGNOSIS — N2581 Secondary hyperparathyroidism of renal origin: Secondary | ICD-10-CM | POA: Diagnosis present

## 2024-01-27 DIAGNOSIS — Z833 Family history of diabetes mellitus: Secondary | ICD-10-CM | POA: Diagnosis not present

## 2024-01-27 DIAGNOSIS — I428 Other cardiomyopathies: Secondary | ICD-10-CM | POA: Diagnosis present

## 2024-01-27 DIAGNOSIS — R739 Hyperglycemia, unspecified: Secondary | ICD-10-CM | POA: Diagnosis present

## 2024-01-27 DIAGNOSIS — N186 End stage renal disease: Secondary | ICD-10-CM | POA: Diagnosis present

## 2024-01-27 DIAGNOSIS — E1011 Type 1 diabetes mellitus with ketoacidosis with coma: Secondary | ICD-10-CM | POA: Diagnosis not present

## 2024-01-27 DIAGNOSIS — Z5982 Transportation insecurity: Secondary | ICD-10-CM | POA: Diagnosis not present

## 2024-01-27 DIAGNOSIS — Z880 Allergy status to penicillin: Secondary | ICD-10-CM | POA: Diagnosis not present

## 2024-01-27 DIAGNOSIS — D631 Anemia in chronic kidney disease: Secondary | ICD-10-CM | POA: Diagnosis present

## 2024-01-27 DIAGNOSIS — F319 Bipolar disorder, unspecified: Secondary | ICD-10-CM | POA: Diagnosis present

## 2024-01-27 DIAGNOSIS — Z825 Family history of asthma and other chronic lower respiratory diseases: Secondary | ICD-10-CM | POA: Diagnosis not present

## 2024-01-27 DIAGNOSIS — I132 Hypertensive heart and chronic kidney disease with heart failure and with stage 5 chronic kidney disease, or end stage renal disease: Secondary | ICD-10-CM | POA: Diagnosis present

## 2024-01-27 DIAGNOSIS — T182XXA Foreign body in stomach, initial encounter: Secondary | ICD-10-CM | POA: Diagnosis not present

## 2024-01-27 DIAGNOSIS — Z881 Allergy status to other antibiotic agents status: Secondary | ICD-10-CM | POA: Diagnosis not present

## 2024-01-27 DIAGNOSIS — E1010 Type 1 diabetes mellitus with ketoacidosis without coma: Secondary | ICD-10-CM | POA: Diagnosis present

## 2024-01-27 DIAGNOSIS — E10649 Type 1 diabetes mellitus with hypoglycemia without coma: Secondary | ICD-10-CM | POA: Diagnosis not present

## 2024-01-27 DIAGNOSIS — Z885 Allergy status to narcotic agent status: Secondary | ICD-10-CM | POA: Diagnosis not present

## 2024-01-27 DIAGNOSIS — Z1152 Encounter for screening for COVID-19: Secondary | ICD-10-CM | POA: Diagnosis not present

## 2024-01-27 DIAGNOSIS — E1043 Type 1 diabetes mellitus with diabetic autonomic (poly)neuropathy: Secondary | ICD-10-CM | POA: Diagnosis present

## 2024-01-27 DIAGNOSIS — Z992 Dependence on renal dialysis: Secondary | ICD-10-CM | POA: Diagnosis not present

## 2024-01-27 DIAGNOSIS — R1310 Dysphagia, unspecified: Secondary | ICD-10-CM | POA: Diagnosis not present

## 2024-01-27 DIAGNOSIS — I5042 Chronic combined systolic (congestive) and diastolic (congestive) heart failure: Secondary | ICD-10-CM | POA: Diagnosis present

## 2024-01-27 DIAGNOSIS — K208 Other esophagitis without bleeding: Secondary | ICD-10-CM | POA: Diagnosis not present

## 2024-01-27 DIAGNOSIS — K920 Hematemesis: Secondary | ICD-10-CM | POA: Diagnosis present

## 2024-01-27 DIAGNOSIS — R6510 Systemic inflammatory response syndrome (SIRS) of non-infectious origin without acute organ dysfunction: Secondary | ICD-10-CM | POA: Diagnosis present

## 2024-01-27 DIAGNOSIS — R748 Abnormal levels of other serum enzymes: Secondary | ICD-10-CM | POA: Diagnosis not present

## 2024-01-27 DIAGNOSIS — E785 Hyperlipidemia, unspecified: Secondary | ICD-10-CM | POA: Diagnosis present

## 2024-01-27 DIAGNOSIS — K209 Esophagitis, unspecified without bleeding: Secondary | ICD-10-CM | POA: Diagnosis not present

## 2024-01-27 DIAGNOSIS — Z560 Unemployment, unspecified: Secondary | ICD-10-CM | POA: Diagnosis not present

## 2024-01-27 DIAGNOSIS — E1022 Type 1 diabetes mellitus with diabetic chronic kidney disease: Secondary | ICD-10-CM | POA: Diagnosis present

## 2024-01-27 LAB — BASIC METABOLIC PANEL WITH GFR
Anion gap: 27 — ABNORMAL HIGH (ref 5–15)
Anion gap: 28 — ABNORMAL HIGH (ref 5–15)
Anion gap: 17 — ABNORMAL HIGH (ref 5–15)
Anion gap: 16 — ABNORMAL HIGH (ref 5–15)
BUN: 59 mg/dL — ABNORMAL HIGH (ref 6–20)
BUN: 63 mg/dL — ABNORMAL HIGH (ref 6–20)
BUN: 60 mg/dL — ABNORMAL HIGH (ref 6–20)
BUN: 60 mg/dL — ABNORMAL HIGH (ref 6–20)
CO2: 18 mmol/L — ABNORMAL LOW (ref 22–32)
CO2: 15 mmol/L — ABNORMAL LOW (ref 22–32)
CO2: 21 mmol/L — ABNORMAL LOW (ref 22–32)
CO2: 23 mmol/L (ref 22–32)
Calcium: 8.7 mg/dL — ABNORMAL LOW (ref 8.9–10.3)
Calcium: 8.9 mg/dL (ref 8.9–10.3)
Calcium: 8.1 mg/dL — ABNORMAL LOW (ref 8.9–10.3)
Calcium: 8.3 mg/dL — ABNORMAL LOW (ref 8.9–10.3)
Chloride: 96 mmol/L — ABNORMAL LOW (ref 98–111)
Chloride: 99 mmol/L (ref 98–111)
Chloride: 100 mmol/L (ref 98–111)
Chloride: 100 mmol/L (ref 98–111)
Creatinine, Ser: 8.47 mg/dL — ABNORMAL HIGH (ref 0.44–1.00)
Creatinine, Ser: 7.95 mg/dL — ABNORMAL HIGH (ref 0.44–1.00)
Creatinine, Ser: 8.1 mg/dL — ABNORMAL HIGH (ref 0.44–1.00)
Creatinine, Ser: 8.12 mg/dL — ABNORMAL HIGH (ref 0.44–1.00)
GFR, Estimated: 6 mL/min — ABNORMAL LOW (ref >60)
GFR, Estimated: 6 mL/min — ABNORMAL LOW (ref >60)
GFR, Estimated: 6 mL/min — ABNORMAL LOW (ref >60)
GFR, Estimated: 6 mL/min — ABNORMAL LOW (ref >60)
Glucose, Bld: 150 mg/dL — ABNORMAL HIGH (ref 70–99)
Glucose, Bld: 100 mg/dL — ABNORMAL HIGH (ref 70–99)
Glucose, Bld: 90 mg/dL (ref 70–99)
Glucose, Bld: 92 mg/dL (ref 70–99)
Potassium: 3.4 mmol/L — ABNORMAL LOW (ref 3.5–5.1)
Potassium: 4.4 mmol/L (ref 3.5–5.1)
Potassium: 3.9 mmol/L (ref 3.5–5.1)
Potassium: 4 mmol/L (ref 3.5–5.1)
Sodium: 141 mmol/L (ref 135–145)
Sodium: 142 mmol/L (ref 135–145)
Sodium: 138 mmol/L (ref 135–145)
Sodium: 139 mmol/L (ref 135–145)

## 2024-01-27 LAB — CBC
HCT: 28.7 % — ABNORMAL LOW (ref 36.0–46.0)
Hemoglobin: 9.1 g/dL — ABNORMAL LOW (ref 12.0–15.0)
MCH: 27.3 pg (ref 26.0–34.0)
MCHC: 31.7 g/dL (ref 30.0–36.0)
MCV: 86.2 fL (ref 80.0–100.0)
Platelets: 342 K/uL (ref 150–400)
RBC: 3.33 MIL/uL — ABNORMAL LOW (ref 3.87–5.11)
RDW: 15.9 % — ABNORMAL HIGH (ref 11.5–15.5)
WBC: 17.2 K/uL — ABNORMAL HIGH (ref 4.0–10.5)
nRBC: 0.1 % (ref 0.0–0.2)

## 2024-01-27 LAB — GLUCOSE, CAPILLARY
Glucose-Capillary: 100 mg/dL — ABNORMAL HIGH (ref 70–99)
Glucose-Capillary: 109 mg/dL — ABNORMAL HIGH (ref 70–99)
Glucose-Capillary: 109 mg/dL — ABNORMAL HIGH (ref 70–99)
Glucose-Capillary: 114 mg/dL — ABNORMAL HIGH (ref 70–99)
Glucose-Capillary: 122 mg/dL — ABNORMAL HIGH (ref 70–99)
Glucose-Capillary: 126 mg/dL — ABNORMAL HIGH (ref 70–99)
Glucose-Capillary: 136 mg/dL — ABNORMAL HIGH (ref 70–99)
Glucose-Capillary: 144 mg/dL — ABNORMAL HIGH (ref 70–99)
Glucose-Capillary: 156 mg/dL — ABNORMAL HIGH (ref 70–99)
Glucose-Capillary: 188 mg/dL — ABNORMAL HIGH (ref 70–99)
Glucose-Capillary: 210 mg/dL — ABNORMAL HIGH (ref 70–99)
Glucose-Capillary: 51 mg/dL — ABNORMAL LOW (ref 70–99)
Glucose-Capillary: 82 mg/dL (ref 70–99)
Glucose-Capillary: 93 mg/dL (ref 70–99)

## 2024-01-27 LAB — BETA-HYDROXYBUTYRIC ACID
Beta-Hydroxybutyric Acid: 3.18 mmol/L — ABNORMAL HIGH (ref 0.05–0.27)
Beta-Hydroxybutyric Acid: 0.25 mmol/L (ref 0.05–0.27)
Beta-Hydroxybutyric Acid: 0.1 mmol/L (ref 0.05–0.27)
Beta-Hydroxybutyric Acid: 0.08 mmol/L (ref 0.05–0.27)

## 2024-01-27 MED ORDER — ONDANSETRON HCL 4 MG/2ML IJ SOLN
4.0000 mg | Freq: Four times a day (QID) | INTRAMUSCULAR | Status: DC | PRN
  Administered 2024-01-28: 4 mg via INTRAVENOUS
  Filled 2024-01-27: qty 2

## 2024-01-27 MED ORDER — INSULIN ASPART 100 UNIT/ML IJ SOLN
0.0000 [IU] | Freq: Three times a day (TID) | INTRAMUSCULAR | Status: DC
  Administered 2024-01-28: 2 [IU] via SUBCUTANEOUS
  Administered 2024-01-28: 5 [IU] via SUBCUTANEOUS

## 2024-01-27 MED ORDER — POTASSIUM CHLORIDE CRYS ER 20 MEQ PO TBCR
20.0000 meq | EXTENDED_RELEASE_TABLET | Freq: Once | ORAL | Status: AC
  Administered 2024-01-27: 20 meq via ORAL
  Filled 2024-01-27: qty 1

## 2024-01-27 MED ORDER — METOPROLOL TARTRATE 5 MG/5ML IV SOLN: 5.0000 mg | INTRAVENOUS | Status: DC | PRN

## 2024-01-27 MED ORDER — INSULIN GLARGINE 100 UNIT/ML ~~LOC~~ SOLN
10.0000 [IU] | Freq: Every day | SUBCUTANEOUS | Status: DC
  Administered 2024-01-27 – 2024-01-28 (×2): 10 [IU] via SUBCUTANEOUS
  Filled 2024-01-27 (×2): qty 0.1

## 2024-01-27 MED ORDER — CHLORHEXIDINE GLUCONATE CLOTH 2 % EX PADS
6.0000 | MEDICATED_PAD | Freq: Every day | CUTANEOUS | Status: DC
  Administered 2024-01-27 – 2024-01-29 (×2): 6 via TOPICAL

## 2024-01-27 MED ORDER — ORAL CARE MOUTH RINSE: 15.0000 mL | OROMUCOSAL | Status: DC | PRN

## 2024-01-27 MED ORDER — DEXTROSE 50 % IV SOLN
25.0000 g | INTRAVENOUS | Status: AC
  Administered 2024-01-27: 25 g via INTRAVENOUS

## 2024-01-27 MED ORDER — HYDRALAZINE HCL 20 MG/ML IJ SOLN
10.0000 mg | INTRAMUSCULAR | Status: DC | PRN
Start: 2024-01-27 — End: 2024-01-29

## 2024-01-27 MED ORDER — POTASSIUM CHLORIDE 10 MEQ/100ML IV SOLN
10.0000 meq | Freq: Once | INTRAVENOUS | Status: AC
  Administered 2024-01-27: 10 meq via INTRAVENOUS
  Filled 2024-01-27: qty 100

## 2024-01-27 MED ORDER — IPRATROPIUM-ALBUTEROL 0.5-2.5 (3) MG/3ML IN SOLN: 3.0000 mL | RESPIRATORY_TRACT | Status: DC | PRN

## 2024-01-27 MED ORDER — DEXTROSE 5 % IV SOLN: INTRAVENOUS | Status: AC

## 2024-01-27 MED ORDER — GLUCAGON HCL RDNA (DIAGNOSTIC) 1 MG IJ SOLR
1.0000 mg | INTRAMUSCULAR | Status: AC | PRN
  Administered 2024-01-29: 1 mg via INTRAVENOUS
  Filled 2024-01-27: qty 1

## 2024-01-27 MED ORDER — SENNOSIDES-DOCUSATE SODIUM 8.6-50 MG PO TABS: 1.0000 | ORAL_TABLET | Freq: Every evening | ORAL | Status: DC | PRN

## 2024-01-27 MED ORDER — INSULIN ASPART 100 UNIT/ML IJ SOLN: 2.0000 [IU] | Freq: Three times a day (TID) | INTRAMUSCULAR | Status: DC

## 2024-01-27 MED ORDER — INSULIN GLARGINE-YFGN 100 UNIT/ML ~~LOC~~ SOLN: 10.0000 [IU] | Freq: Every day | SUBCUTANEOUS | Status: DC

## 2024-01-27 MED ORDER — TRAZODONE HCL 50 MG PO TABS
50.0000 mg | ORAL_TABLET | Freq: Every evening | ORAL | Status: DC | PRN
  Administered 2024-01-29 – 2024-01-30 (×2): 50 mg via ORAL
  Filled 2024-01-27 (×2): qty 1

## 2024-01-27 NOTE — Progress Notes (Signed)
 PROGRESS NOTE    Ashley Freeman  FMW:[id number] DOB: 04/08/1991 DOA: 01/26/2024 PCP: Keven Crumbly Pap, MD    Brief Narrative:   33 year old female with history of HTN, HLD, ESRD on HD MWF, CHF with reduced EF, DM1, bipolar disorder, noncompliance with multiple admissions for DKA/fluid overload admitted for nausea and vomiting.  Was recently in the ER on 9/26 for shortness of breath after missing dialysis.  Symptoms improved after HD session therefore discharged home.  Now noted to be hyperglycemic with elevated anion gap and beta hydroxybutyrate acid.  Started on insulin  drip.  Assessment & Plan:   DKA, type I Insulin -dependent diabetes mellitus type 1 -A1c 10.8.  Blood glucose now less than 150 but acidosis has not yet resolved therefore we will continue insulin  drip/Endo tool.  Hopefully later will transition.  Dialysis should help acidosis as well -Periodic lab work, consult Diabetic coordinator.    SIRS Lactic acidosis In the setting of DKA.  No acute infection identified.  Will continue to monitor this   Hematemesis with nausea Acute.  Suspect could be mild Mallory-Weiss/gastritis.  Continue PPI twice daily   Anemia of chronic disease Stable hemoglobin baseline 9.5   Prolonged QT interval Chronic.  QTc 543.   ESRD on HD Last HD 9/27.  Nephrology has been consulted   Heart failure with reduced ejection fraction, EF 35% Closely monitor volume status in the setting of DKA.  Resume home medications as appropriate   Essential hypertension - Continue Bumex , Coreg , Entresto   Bipolar disorder - Continue Cymbalta     DVT prophylaxis: SCDs Start: 01/26/24 1545      Code Status: Full Code Family Communication:   Status is: DKA management.    PT Follow up Recs:   Subjective:  Drowsy, denies any chest pain and shortness of breath  Examination:  General exam: Appears calm and comfortable, drowsy, chronically ill Respiratory system: Clear to  auscultation. Respiratory effort normal. Cardiovascular system: S1 & S2 heard, RRR. No JVD, murmurs, rubs, gallops or clicks. No pedal edema. Gastrointestinal system: Abdomen is nondistended, soft and nontender. No organomegaly or masses felt. Normal bowel sounds heard. Central nervous system: Alert and oriented. No focal neurological deficits. Extremities: Symmetric 5 x 5 power. Skin: No rashes, lesions or ulcers Psychiatry: Judgement and insight appear normal. Mood & affect appropriate. Right upper extremity fistula               Diet Orders (From admission, onward)     Start     Ordered   01/26/24 1644  Diet NPO time specified Except for: Sips with Meds  Diet effective now       Question:  Except for  Answer:  Sips with Meds   01/26/24 1643            Objective: Vitals:   01/27/24 0724 01/27/24 1052 01/27/24 1107 01/27/24 1111  BP: (!) 158/100 (!) 168/97    Pulse: 86     Resp: 20 19    Temp: (!) 97.5 F (36.4 C) 97.6 F (36.4 C)    TempSrc: Oral Oral    SpO2: 96% 97%    Weight:    64.8 kg  Height:   5' 3 (1.6 m)     Intake/Output Summary (Last 24 hours) at 01/27/2024 1155 Last data filed at 01/27/2024 0539 Gross per 24 hour  Intake 1493.64 ml  Output --  Net 1493.64 ml   Filed Weights   01/27/24 1111  Weight: 64.8 kg  Scheduled Meds:  calcitRIOL   1.25 mcg Oral Q M,W,F   carvedilol   25 mg Oral BID WC   Chlorhexidine  Gluconate Cloth  6 each Topical Q0600   DULoxetine   20 mg Oral Daily   lamoTRIgine   200 mg Oral Daily   pantoprazole  (PROTONIX ) IV  40 mg Intravenous Q12H   rosuvastatin   40 mg Oral Daily   sacubitril-valsartan  1 tablet Oral BID   sodium bicarbonate   650 mg Oral BID   sodium chloride  flush  3 mL Intravenous Q12H   Continuous Infusions:  dextrose  40 mL/hr at 01/27/24 1121   insulin  1.6 Units/hr (01/27/24 1100)    Nutritional status     Body mass index is 25.31 kg/m.  Data Reviewed:   CBC: Recent Labs  Lab  01/24/24 2356 01/26/24 1218 01/26/24 1829 01/27/24 0236  WBC 10.9* 15.5*  --  17.2*  NEUTROABS 4.8 12.8*  --   --   HGB 12.1 9.6* 8.7* 9.1*  HCT 39.4 32.7* 30.8* 28.7*  MCV 89.3 94.5  --  86.2  PLT 359 340  --  342   Basic Metabolic Panel: Recent Labs  Lab 01/26/24 1218 01/26/24 1829 01/26/24 2036 01/27/24 0236 01/27/24 0835  NA 136 138 139 141 142  K 5.1 4.3 4.1 3.4* 4.4  CL 92* 93* 93* 96* 99  CO2 <7* <7* 11* 18* 15*  GLUCOSE 589* 417* 339* 150* 100*  BUN 54* 59* 59* 59* 63*  CREATININE 8.22* 8.29* 8.41* 8.47* 7.95*  CALCIUM  8.9 8.3* 8.6* 8.7* 8.9  PHOS 9.6*  --   --   --   --    GFR: Estimated Creatinine Clearance: 9.3 mL/min (A) (by C-G formula based on SCr of 7.95 mg/dL (H)). Liver Function Tests: Recent Labs  Lab 01/26/24 1218  AST 18  ALT 34  ALKPHOS 558*  BILITOT 3.1*  PROT 6.2*  ALBUMIN  3.4*   No results for input(s): LIPASE, AMYLASE in the last 168 hours. No results for input(s): AMMONIA in the last 168 hours. Coagulation Profile: No results for input(s): INR, PROTIME in the last 168 hours. Cardiac Enzymes: Recent Labs  Lab 01/26/24 1218  CKTOTAL 143   BNP (last 3 results) No results for input(s): PROBNP in the last 8760 hours. HbA1C: No results for input(s): HGBA1C in the last 72 hours. CBG: Recent Labs  Lab 01/27/24 0658 01/27/24 0803 01/27/24 0852 01/27/24 0934 01/27/24 1050  GLUCAP 122* 109* 93 100* 136*   Lipid Profile: No results for input(s): CHOL, HDL, LDLCALC, TRIG, CHOLHDL, LDLDIRECT in the last 72 hours. Thyroid  Function Tests: No results for input(s): TSH, T4TOTAL, FREET4, T3FREE, THYROIDAB in the last 72 hours. Anemia Panel: No results for input(s): VITAMINB12, FOLATE, FERRITIN, TIBC, IRON , RETICCTPCT in the last 72 hours. Sepsis Labs: Recent Labs  Lab 01/26/24 1229  LATICACIDVEN 2.7*    Recent Results (from the past 240 hours)  Resp panel by RT-PCR (RSV, Flu A&B,  Covid) Anterior Nasal Swab     Status: None   Collection Time: 01/24/24 11:56 PM   Specimen: Anterior Nasal Swab  Result Value Ref Range Status   SARS Coronavirus 2 by RT PCR NEGATIVE NEGATIVE Final   Influenza A by PCR NEGATIVE NEGATIVE Final   Influenza B by PCR NEGATIVE NEGATIVE Final    Comment: (NOTE) The Xpert Xpress SARS-CoV-2/FLU/RSV plus assay is intended as an aid in the diagnosis of influenza from Nasopharyngeal swab specimens and should not be used as a sole basis for treatment. Nasal washings and  aspirates are unacceptable for Xpert Xpress SARS-CoV-2/FLU/RSV testing.  Fact Sheet for Patients: BloggerCourse.com  Fact Sheet for Healthcare Providers: SeriousBroker.it  This test is not yet approved or cleared by the [place]  FDA and has been authorized for detection and/or diagnosis of SARS-CoV-2 by FDA under an Emergency Use Authorization (EUA). This EUA will remain in effect (meaning this test can be used) for the duration of the COVID-19 declaration under Section 564(b)(1) of the Act, 21 U.S.C. section 360bbb-3(b)(1), unless the authorization is terminated or revoked.     Resp Syncytial Virus by PCR NEGATIVE NEGATIVE Final    Comment: (NOTE) Fact Sheet for Patients: BloggerCourse.com  Fact Sheet for Healthcare Providers: SeriousBroker.it  This test is not yet approved or cleared by the [place]  FDA and has been authorized for detection and/or diagnosis of SARS-CoV-2 by FDA under an Emergency Use Authorization (EUA). This EUA will remain in effect (meaning this test can be used) for the duration of the COVID-19 declaration under Section 564(b)(1) of the Act, 21 U.S.C. section 360bbb-3(b)(1), unless the authorization is terminated or revoked.  Performed at [facility], [address] [place], [place] [address]   Resp panel by RT-PCR (RSV, Flu  A&B, Covid) Anterior Nasal Swab     Status: None   Collection Time: 01/26/24 12:24 PM   Specimen: Anterior Nasal Swab  Result Value Ref Range Status   SARS Coronavirus 2 by RT PCR NEGATIVE NEGATIVE Final   Influenza A by PCR NEGATIVE NEGATIVE Final   Influenza B by PCR NEGATIVE NEGATIVE Final    Comment: (NOTE) The Xpert Xpress SARS-CoV-2/FLU/RSV plus assay is intended as an aid in the diagnosis of influenza from Nasopharyngeal swab specimens and should not be used as a sole basis for treatment. Nasal washings and aspirates are unacceptable for Xpert Xpress SARS-CoV-2/FLU/RSV testing.  Fact Sheet for Patients: BloggerCourse.com  Fact Sheet for Healthcare Providers: SeriousBroker.it  This test is not yet approved or cleared by the [place]  FDA and has been authorized for detection and/or diagnosis of SARS-CoV-2 by FDA under an Emergency Use Authorization (EUA). This EUA will remain in effect (meaning this test can be used) for the duration of the COVID-19 declaration under Section 564(b)(1) of the Act, 21 U.S.C. section 360bbb-3(b)(1), unless the authorization is terminated or revoked.     Resp Syncytial Virus by PCR NEGATIVE NEGATIVE Final    Comment: (NOTE) Fact Sheet for Patients: BloggerCourse.com  Fact Sheet for Healthcare Providers: SeriousBroker.it  This test is not yet approved or cleared by the [place]  FDA and has been authorized for detection and/or diagnosis of SARS-CoV-2 by FDA under an Emergency Use Authorization (EUA). This EUA will remain in effect (meaning this test can be used) for the duration of the COVID-19 declaration under Section 564(b)(1) of the Act, 21 U.S.C. section 360bbb-3(b)(1), unless the authorization is terminated or revoked.  Performed at [facility], [address] [place], [place] [address]   Blood culture (routine x 2)      Status: None (Preliminary result)   Collection Time: 01/26/24 12:35 PM   Specimen: BLOOD LEFT ARM  Result Value Ref Range Status   Specimen Description BLOOD LEFT ARM  Final   Special Requests   Final    BOTTLES DRAWN AEROBIC AND ANAEROBIC Blood Culture results may not be optimal due to an inadequate volume of blood received in culture bottles   Culture   Final    NO GROWTH < 24 HOURS Performed at  [facility], [address] [place], [place] [address]    Report Status PENDING  Incomplete  Blood culture (routine x 2)     Status: None (Preliminary result)   Collection Time: 01/26/24  6:29 PM   Specimen: BLOOD LEFT HAND  Result Value Ref Range Status   Specimen Description BLOOD LEFT HAND  Final   Special Requests   Final    BOTTLES DRAWN AEROBIC AND ANAEROBIC Blood Culture adequate volume   Culture   Final    NO GROWTH < 24 HOURS Performed at [facility], [address] [place], [place] [address]    Report Status PENDING  Incomplete  MRSA Next Gen by PCR, Nasal     Status: None   Collection Time: 01/26/24  7:26 PM   Specimen: Nasal Mucosa; Nasal Swab  Result Value Ref Range Status   MRSA by PCR Next Gen NOT DETECTED NOT DETECTED Final    Comment: (NOTE) The GeneXpert MRSA Assay (FDA approved for NASAL specimens only), is one component of a comprehensive MRSA colonization surveillance program. It is not intended to diagnose MRSA infection nor to guide or monitor treatment for MRSA infections. Test performance is not FDA approved in patients less than 70 years old. Performed at [facility], [address] [place], [place] [address]          Radiology Studies: DG Chest Portable 1 View Result Date: 01/26/2024 EXAM: 1 VIEW(S) XRAY OF THE CHEST 01/26/2024 12:45:00 PM COMPARISON: 01/24/2024 CLINICAL HISTORY: weakness. Reason for exam:weakness; Triage notes: Pt presents to ED via EMS from home with complaint of vomiting black stuff, no black stool began  last night. Weakness. Dialyzed yesterday, missed an appointment or two due to transportation issues. FINDINGS: LUNGS AND PLEURA: Low lung volumes. No focal pulmonary opacity. No pulmonary edema. No pleural effusion. No pneumothorax. HEART AND MEDIASTINUM: Persistent cardiomegaly. No acute abnormality of the mediastinal silhouette. BONES AND SOFT TISSUES: No acute osseous abnormality. IMPRESSION: 1. No acute cardiopulmonary process identified. Electronically signed by: Waddell Calk MD 01/26/2024 01:06 PM EDT RP Workstation: [id number]           LOS: 0 days   Time spent= 35 mins    Burgess JAYSON Dare, MD [facility]  If 7PM-7AM, please contact night-coverage  01/27/2024, 11:55 AM

## 2024-01-27 NOTE — Plan of Care (Signed)

## 2024-01-27 NOTE — Inpatient Diabetes Management (Addendum)
 Inpatient Diabetes Program Recommendations  AACE/ADA: New Consensus Statement on Inpatient Glycemic Control (2015)  Target Ranges:  Prepandial:   less than 140 mg/dL      Peak postprandial:   less than 180 mg/dL (1-2 hours)      Critically ill patients:  140 - 180 mg/dL   Lab Results  Component Value Date   GLUCAP 100 (H) 01/27/2024   HGBA1C 10.8 (H) 12/20/2023    Review of Glycemic Control  Latest Reference Range & Units 01/27/24 08:03 01/27/24 08:52 01/27/24 09:34  Glucose-Capillary 70 - 99 mg/dL 890 (H) 93 899 (H)   Diabetes history: DM 1/ESRD Outpatient Diabetes medications:  Novolog  5 units tid with meals Lantus  10 units daily Current orders for Inpatient glycemic control:  IV insulin -  Inpatient Diabetes Program Recommendations:    Patient has had >15 admits in 2025.   Note that CO2 is still 15.  May consider allowing patient to eat to help clear acidosis. Per RN, patient continues to be lethargic.  For transition, consider Lantus  10 units daily plus Novolog  0-6 units q 4 hours.   Thanks,  Randall Bullocks, RN, BC-ADM Inpatient Diabetes Coordinator Pager [phone]  (8a-5p)

## 2024-01-27 NOTE — Progress Notes (Signed)
 Pt receives out-pt HD at [facility] on MWF. Will assist as needed.   Randine Mungo Dialysis Navigator [phone]

## 2024-01-27 NOTE — Plan of Care (Signed)

## 2024-01-27 NOTE — Progress Notes (Signed)
 Hypoglycemic Event  CBG: 51  Treatment: D50 50 mL (25 gm)  Symptoms: None  Follow-up CBG: Time:1711 CBG Result:156  Possible Reasons for Event: Inadequate meal intake  Comments/MD notified:Dr. Caleen notified at [address] [place]

## 2024-01-27 NOTE — Consult Note (Signed)
 Renal Service Consult Note [facility] [name], MD  Patient: Ashley Freeman Date: 01/27/2024 Requesting Physician: Dr. Caleen   Reason for Consult: ESRD patient with DKA HPI: The patient is a 33 y.o. year-old w/ PMH as below who presented to the ED on 926 3 days ago.  She complained of shortness of breath and had missed her dialysis on Wednesday and Friday.  He was not in distress.  She underwent ED HD and was returned to the emergency room for discharge but then developed nausea and vomiting and was kept over the weekend and admitted for DKA.  She is still on IV insulin .  Vital signs today BP 158/100, HR 86, RR 20, temp 97.5, room air 98%.  Sodium 142, K+ 4.4, CO2 15, BUN 63, creatinine 7.9, anion gap 28, WBC 17K, Hgb 9.1.  Beta hydroxy 3.18 this morning at 2 AM, down from 8 yesterday. We are asked to see for ESRD.   Pt seen in room. Pt is groggy, poor historian. Denies SOB, on RA.    ROS - denies CP, no joint pain, no HA, no blurry vision, no rash, no diarrhea, no nausea/ vomiting   Past Medical History  Past Medical History:  Diagnosis Date   Abscess, gluteal, right 08/24/2013   Anemia 02/19/2012   Bartholin's gland abscess 09/19/2013   Bipolar disorder (HCC)    BV (bacterial vaginosis) 11/24/2015   Depression    Diabetes mellitus type I (HCC) 2001   Diagnosed at age 25 ; Type I   Diarrhea 05/30/2016   DKA (diabetic ketoacidoses) 08/19/2013   Also in 2018   ESRD (end stage renal disease) (HCC)    Gonorrhea 08/2011   Treated in 09/2011   HFrEF (heart failure with reduced ejection fraction) (HCC)    a. 2022 Echo: EF 40%; b. 10/2021 Echo: EF 55%; b. 07/2022 MV: No ischemia. EF 31%; c. 08/2022 Echo: EF 35%, mildly dil RV, sev TR.   History of trichomoniasis 05/31/2016   Hyperlipidemia 03/28/2016   Hypertension    NICM (nonischemic cardiomyopathy) (HCC)    Sepsis (HCC) 09/19/2013   Past Surgical History  Past Surgical History:  Procedure  Laterality Date   A/V FISTULAGRAM Right 06/17/2023   Procedure: A/V Fistulagram;  Surgeon: Marea Selinda GORMAN, MD;  Location: [facility] INVASIVE CV LAB;  Service: Cardiovascular;  Laterality: Right;   A/V SHUNT INTERVENTION N/A 09/25/2023   Procedure: A/V SHUNT INTERVENTION;  Surgeon: Pearline Norman GORMAN, MD;  Location: [facility] PV LAB;  Service: Cardiovascular;  Laterality: N/A;   AV FISTULA PLACEMENT Right 07/06/2022   Procedure: ARTERIOVENOUS GRAFT CREATION;  Surgeon: Gretta Lonni PARAS, MD;  Location: [facility] OR;  Service: Vascular;  Laterality: Right;   CESAREAN SECTION N/A 10/05/2019   Procedure: CESAREAN SECTION;  Surgeon: Izell Harari, MD;  Location: [facility] LD ORS;  Service: Obstetrics;  Laterality: N/A;   CHOLECYSTECTOMY N/A 07/02/2023   Procedure: LAPAROSCOPIC CHOLECYSTECTOMY;  Surgeon: Ebbie Cough, MD;  Location: [facility] OR;  Service: General;  Laterality: N/A;   INCISION AND DRAINAGE ABSCESS Left 09/28/2019   Procedure: INCISION AND DRAINAGE VULVAR ABCESS;  Surgeon: Edsel Norleen GAILS, MD;  Location: [facility] OR;  Service: Gynecology;  Laterality: Left;   INCISION AND DRAINAGE PERIRECTAL ABSCESS Right 08/18/2013   Procedure: IRRIGATION AND DEBRIDEMENT GLUTEAL ABSCESS;  Surgeon: Lynda Leos, MD;  Location: [facility] OR;  Service: General;  Laterality: Right;   INCISION AND DRAINAGE PERIRECTAL ABSCESS Right 09/19/2013   Procedure: IRRIGATION AND DEBRIDEMENT RIGHT GLUTEAL AND LABIAL ABSCESSES;  Surgeon: Lynda Leos, MD;  Location: [facility] OR;  Service: General;  Laterality: Right;   INCISION AND DRAINAGE PERIRECTAL ABSCESS Right 09/24/2013   Procedure: IRRIGATION AND DEBRIDEMENT PERIRECTAL ABSCESS;  Surgeon: Lynwood MALVA Pina, MD;  Location: [facility] OR;  Service: General;  Laterality: Right;   IR PARACENTESIS  08/28/2023   IR PARACENTESIS  11/04/2023   IR PARACENTESIS  12/23/2023   Family History  Family History  Problem Relation Age of Onset   Asthma Mother    Carpal tunnel syndrome Mother    Gout Father    Diabetes  Paternal Grandmother    Anesthesia problems Neg Hx    Social History  reports that she has never smoked. She has never been exposed to tobacco smoke. She has never used smokeless tobacco. She reports that she does not currently use alcohol. She reports that she does not use drugs. Allergies  Allergies  Allergen Reactions   Keflex  [Cephalexin ] Anaphylaxis    Ceftriaxone  in the past with no reaction   Penicillins Anaphylaxis, Hives and Rash   Vibramycin  [Doxycycline ] Anaphylaxis   Benadryl  [Diphenhydramine ] Itching   Dilaudid  [Hydromorphone ] Itching   Methotrexate Derivatives Rash   Roxicodone  [Oxycodone ] Itching    Takes Percocet without issue   Home medications Prior to Admission medications   Medication Sig Start Date End Date Taking? Authorizing Provider  NOVOLOG  FLEXPEN 100 UNIT/ML FlexPen INJECT 5 UNITS UNDER THE SKIN 3 TIMES A DAY BEFORE MEALS. IF YOU DO NOT EAT A MEAL, PLEASE CHEECK YOUR BLOOD SUGAR AND GIVE YOURSELF 1 UNIT OF INSULIN  FOR EVERY 50MG /DL GREATER THAN 869. MAX 30 UNITS PER DAY. 01/18/24  Yes [provider]  albuterol  (VENTOLIN  HFA) 108 (90 Base) MCG/ACT inhaler Inhale 2 puffs into the lungs every 4 (four) hours as needed for wheezing or shortness of breath. 11/24/22   [provider]  bumetanide  (BUMEX ) 2 MG tablet Take 10 mg by mouth daily.    [provider]  calcitRIOL  (ROCALTROL ) 0.25 MCG capsule Take 5 capsules (1.25 mcg total) by mouth every Tuesday, Thursday, and Saturday at 6 PM. Patient taking differently: Take 1.25 mcg by mouth every Monday, Wednesday, and Friday. 07/09/23   Cindy Garnette POUR, MD  carvedilol  (COREG ) 25 MG tablet Take 25 mg by mouth 2 (two) times daily with a meal.    [provider]  DULoxetine  (CYMBALTA ) 20 MG capsule Take 20 mg by mouth daily. 07/29/23   [provider]  famotidine  (PEPCID ) 20 MG tablet Take 20 mg by mouth daily. 07/29/23   [provider]  fluticasone  (FLONASE ) 50 MCG/ACT  nasal spray Place 2 sprays into both nostrils daily. 12/11/23 12/10/24  [provider]  hydrOXYzine  (ATARAX ) 25 MG tablet Take 25 mg by mouth 3 (three) times daily as needed for anxiety, itching, nausea or vomiting.    [provider]  insulin  aspart (NOVOLOG ) 100 UNIT/ML injection Inject 5 Units into the skin 3 (three) times daily with meals.    [provider]  ketoconazole  (NIZORAL ) 2 % cream Apply 1 Application topically 2 (two) times daily. Apply 1gm twice daily to affected skin on feet 01/06/24   McCaughan, Dia D, DPM  lamoTRIgine  (LAMICTAL ) 200 MG tablet Take 1 tablet (200 mg total) by mouth daily. 10/29/23   Regalado, Belkys A, MD  LANTUS  SOLOSTAR 100 UNIT/ML Solostar Pen Inject 10 Units into the skin daily.    [provider]  OLANZapine  zydis (ZYPREXA ) 5 MG disintegrating tablet Take 5 mg by mouth at bedtime. 09/23/23  02/08/24  [provider]  omeprazole  (PRILOSEC) 40 MG capsule Take 40 mg by mouth daily as needed (for acid refulx). 12/11/23 02/09/24  [provider]  ondansetron  (ZOFRAN -ODT) 4 MG disintegrating tablet Take 1 tablet (4 mg total) by mouth every 8 (eight) hours as needed for nausea or vomiting. 06/23/23   Davis, Jonathon H, MD  oxyCODONE -acetaminophen  (PERCOCET/ROXICET) 5-325 MG tablet Take 1 tablet by mouth every 4 (four) hours as needed for moderate pain (pain score 4-6). 01/20/24   [provider]  Pancrelipase , Lip-Prot-Amyl, 3000-9500 units CPEP Take 3,000 units of lipase by mouth 3 (three) times daily before meals. 09/23/23 02/08/24  [provider]  prochlorperazine  (COMPAZINE ) 5 MG tablet Take 5 mg by mouth every 6 (six) hours as needed for nausea or vomiting.    [provider]  rosuvastatin  (CRESTOR ) 40 MG tablet Take 40 mg by mouth daily. 01/20/24 01/19/25  [provider]  sacubitril-valsartan (ENTRESTO) 24-26 MG Take 1 tablet by mouth 2 (two) times daily.    [provider]   sodium bicarbonate  650 MG tablet Take 650 mg by mouth 2 (two) times daily. 07/29/23   [provider]  SUMAtriptan  (IMITREX ) 50 MG tablet Take 50 mg by mouth every 2 (two) hours as needed for migraine or headache. 06/20/22   [provider]     Vitals:   01/27/24 0724 01/27/24 1052 01/27/24 1107 01/27/24 1111  BP: (!) 158/100 (!) 168/97    Pulse: 86     Resp: 20 19    Temp: (!) 97.5 F (36.4 C) 97.6 F (36.4 C)    TempSrc: Oral Oral    SpO2: 96% 97%    Weight:    64.8 kg  Height:   5' 3 (1.6 m)    Exam Gen alert, no distress, on RA, stable Sclera anicteric, throat clear  No jvd or bruits Chest clear bilat to bases RRR no MRG Abd soft ntnd no mass, 1-2+ ascites +bs Ext bilat 2+ chronic LE edema Neuro is alert, Ox 3 , nf      Home bp meds: Bumex  10mg  every day Coreg  25 bid Entresto bid Others: sod bicarb 650 bid, rocaltrol  1.25 mcg tts    OP HD: MWF [facility]  AVG, heparin  1600 bolus +600/hr, other details to follow     Assessment/ Plan: DKA: in T1DM patient, looks like might be coming off IV insulin  today. Per pmd.   ESRD: on HD MWF. Plan HD today.  HTN: uncont HTN 160-180/ 90-100 range. Get vol down w/ HD today.  Volume: chronic vol overload w/ sig LE edema. CXR clear, on RA.  Anemia of esrd: Hb 8.5- 10 here, get OP records. Follow.   Myer Fret  MD [facility] 01/27/2024, 11:19 AM  Recent Labs  Lab 01/26/24 1218 01/26/24 1829 01/26/24 2036 01/27/24 0236 01/27/24 0835  HGB 9.6* 8.7*  --  9.1*  --   ALBUMIN  3.4*  --   --   --   --   CALCIUM  8.9 8.3*   < > 8.7* 8.9  PHOS 9.6*  --   --   --   --   CREATININE 8.22* 8.29*   < > 8.47* 7.95*  K 5.1 4.3   < > 3.4* 4.4   < > = values in this interval not displayed.   Inpatient medications:  calcitRIOL   1.25 mcg Oral Q M,W,F   carvedilol   25 mg Oral BID WC   Chlorhexidine  Gluconate Cloth  6 each  Topical Q0600   DULoxetine   20 mg Oral Daily   lamoTRIgine   200 mg Oral Daily   pantoprazole   (PROTONIX ) IV  40 mg Intravenous Q12H   rosuvastatin   40 mg Oral Daily   sacubitril-valsartan  1 tablet Oral BID   sodium bicarbonate   650 mg Oral BID   sodium chloride  flush  3 mL Intravenous Q12H    dextrose      insulin  1.6 Units/hr (01/27/24 1100)   acetaminophen  **OR** acetaminophen , dextrose , fentaNYL  (SUBLIMAZE ) injection, glucagon  (human recombinant), hydrALAZINE , ipratropium-albuterol , metoprolol  tartrate, ondansetron  (ZOFRAN ) IV, oxyCODONE -acetaminophen , senna-docusate, traZODone, trimethobenzamide 

## 2024-01-27 NOTE — Hospital Course (Addendum)
 Brief Narrative:   33 year old female with history of HTN, HLD, ESRD on HD MWF, CHF with reduced EF, DM1, bipolar disorder, noncompliance with multiple admissions for DKA/fluid overload admitted for nausea and vomiting.  Was recently in the ER on 9/26 for shortness of breath after missing dialysis.  Symptoms improved after HD session therefore discharged home.  Now noted to be hyperglycemic with elevated anion gap and beta hydroxybutyrate acid.  Started on insulin  drip.  Assessment & Plan:   DKA, type I Insulin -dependent diabetes mellitus type 1 -A1c 10.8.  Blood glucose now less than 150 but acidosis has not yet resolved therefore we will continue insulin  drip/Endo tool.  Hopefully later will transition.  Dialysis should help acidosis as well -Periodic lab work, consult Diabetic coordinator.    SIRS Lactic acidosis In the setting of DKA.  No acute infection identified.  Will continue to monitor this   Hematemesis with nausea Acute.  Suspect could be mild Mallory-Weiss/gastritis.  Continue PPI twice daily   Anemia of chronic disease Stable hemoglobin baseline 9.5   Prolonged QT interval Chronic.  QTc 543.   ESRD on HD Last HD 9/27.  Nephrology has been consulted   Heart failure with reduced ejection fraction, EF 35% Closely monitor volume status in the setting of DKA.  Resume home medications as appropriate   Essential hypertension - Continue Bumex , Coreg , Entresto   Bipolar disorder - Continue Cymbalta     DVT prophylaxis: SCDs Start: 01/26/24 1545      Code Status: Full Code Family Communication:   Status is: DKA management.    PT Follow up Recs:   Subjective:  Drowsy, denies any chest pain and shortness of breath  Examination:  General exam: Appears calm and comfortable, drowsy, chronically ill Respiratory system: Clear to auscultation. Respiratory effort normal. Cardiovascular system: S1 & S2 heard, RRR. No JVD, murmurs, rubs, gallops or clicks. No pedal  edema. Gastrointestinal system: Abdomen is nondistended, soft and nontender. No organomegaly or masses felt. Normal bowel sounds heard. Central nervous system: Alert and oriented. No focal neurological deficits. Extremities: Symmetric 5 x 5 power. Skin: No rashes, lesions or ulcers Psychiatry: Judgement and insight appear normal. Mood & affect appropriate. Right upper extremity fistula

## 2024-01-28 DIAGNOSIS — E1010 Type 1 diabetes mellitus with ketoacidosis without coma: Secondary | ICD-10-CM | POA: Diagnosis not present

## 2024-01-28 LAB — BASIC METABOLIC PANEL WITH GFR
Anion gap: 15 (ref 5–15)
Anion gap: 21 — ABNORMAL HIGH (ref 5–15)
Anion gap: 16 — ABNORMAL HIGH (ref 5–15)
BUN: 61 mg/dL — ABNORMAL HIGH (ref 6–20)
BUN: 29 mg/dL — ABNORMAL HIGH (ref 6–20)
BUN: 32 mg/dL — ABNORMAL HIGH (ref 6–20)
CO2: 25 mmol/L (ref 22–32)
CO2: 19 mmol/L — ABNORMAL LOW (ref 22–32)
CO2: 24 mmol/L (ref 22–32)
Calcium: 8.2 mg/dL — ABNORMAL LOW (ref 8.9–10.3)
Calcium: 8 mg/dL — ABNORMAL LOW (ref 8.9–10.3)
Calcium: 8.4 mg/dL — ABNORMAL LOW (ref 8.9–10.3)
Chloride: 99 mmol/L (ref 98–111)
Chloride: 97 mmol/L — ABNORMAL LOW (ref 98–111)
Chloride: 97 mmol/L — ABNORMAL LOW (ref 98–111)
Creatinine, Ser: 8.2 mg/dL — ABNORMAL HIGH (ref 0.44–1.00)
Creatinine, Ser: 4.98 mg/dL — ABNORMAL HIGH (ref 0.44–1.00)
Creatinine, Ser: 5.6 mg/dL — ABNORMAL HIGH (ref 0.44–1.00)
GFR, Estimated: 6 mL/min — ABNORMAL LOW (ref >60)
GFR, Estimated: 11 mL/min — ABNORMAL LOW (ref >60)
GFR, Estimated: 10 mL/min — ABNORMAL LOW (ref >60)
Glucose, Bld: 97 mg/dL (ref 70–99)
Glucose, Bld: 290 mg/dL — ABNORMAL HIGH (ref 70–99)
Glucose, Bld: 154 mg/dL — ABNORMAL HIGH (ref 70–99)
Potassium: 3.7 mmol/L (ref 3.5–5.1)
Potassium: 3.8 mmol/L (ref 3.5–5.1)
Potassium: 3.8 mmol/L (ref 3.5–5.1)
Sodium: 139 mmol/L (ref 135–145)
Sodium: 137 mmol/L (ref 135–145)
Sodium: 137 mmol/L (ref 135–145)

## 2024-01-28 LAB — GLUCOSE, CAPILLARY
Glucose-Capillary: 169 mg/dL — ABNORMAL HIGH (ref 70–99)
Glucose-Capillary: 49 mg/dL — ABNORMAL LOW (ref 70–99)
Glucose-Capillary: 240 mg/dL — ABNORMAL HIGH (ref 70–99)
Glucose-Capillary: 275 mg/dL — ABNORMAL HIGH (ref 70–99)
Glucose-Capillary: 153 mg/dL — ABNORMAL HIGH (ref 70–99)
Glucose-Capillary: 139 mg/dL — ABNORMAL HIGH (ref 70–99)
Glucose-Capillary: 145 mg/dL — ABNORMAL HIGH (ref 70–99)
Glucose-Capillary: 119 mg/dL — ABNORMAL HIGH (ref 70–99)
Glucose-Capillary: 100 mg/dL — ABNORMAL HIGH (ref 70–99)
Glucose-Capillary: 105 mg/dL — ABNORMAL HIGH (ref 70–99)
Glucose-Capillary: 100 mg/dL — ABNORMAL HIGH (ref 70–99)
Glucose-Capillary: 98 mg/dL (ref 70–99)

## 2024-01-28 LAB — PHOSPHORUS: Phosphorus: 7.9 mg/dL — ABNORMAL HIGH (ref 2.5–4.6)

## 2024-01-28 LAB — CBC
HCT: 30.5 % — ABNORMAL LOW (ref 36.0–46.0)
HCT: 33 % — ABNORMAL LOW (ref 36.0–46.0)
Hemoglobin: 9.7 g/dL — ABNORMAL LOW (ref 12.0–15.0)
Hemoglobin: 10.1 g/dL — ABNORMAL LOW (ref 12.0–15.0)
MCH: 27.2 pg (ref 26.0–34.0)
MCH: 26.8 pg (ref 26.0–34.0)
MCHC: 31.8 g/dL (ref 30.0–36.0)
MCHC: 30.6 g/dL (ref 30.0–36.0)
MCV: 85.4 fL (ref 80.0–100.0)
MCV: 87.5 fL (ref 80.0–100.0)
Platelets: 268 K/uL (ref 150–400)
Platelets: 258 K/uL (ref 150–400)
RBC: 3.57 MIL/uL — ABNORMAL LOW (ref 3.87–5.11)
RBC: 3.77 MIL/uL — ABNORMAL LOW (ref 3.87–5.11)
RDW: 15.9 % — ABNORMAL HIGH (ref 11.5–15.5)
RDW: 15.8 % — ABNORMAL HIGH (ref 11.5–15.5)
WBC: 15.4 K/uL — ABNORMAL HIGH (ref 4.0–10.5)
WBC: 16.2 K/uL — ABNORMAL HIGH (ref 4.0–10.5)
nRBC: 0.9 % — ABNORMAL HIGH (ref 0.0–0.2)
nRBC: 0.2 % (ref 0.0–0.2)

## 2024-01-28 LAB — BETA-HYDROXYBUTYRIC ACID
Beta-Hydroxybutyric Acid: 0.31 mmol/L — ABNORMAL HIGH (ref 0.05–0.27)
Beta-Hydroxybutyric Acid: 5 mmol/L — ABNORMAL HIGH (ref 0.05–0.27)
Beta-Hydroxybutyric Acid: 0.24 mmol/L (ref 0.05–0.27)

## 2024-01-28 LAB — MAGNESIUM: Magnesium: 2.7 mg/dL — ABNORMAL HIGH (ref 1.7–2.4)

## 2024-01-28 MED ORDER — INSULIN REGULAR(HUMAN) IN NACL 100-0.9 UT/100ML-% IV SOLN
INTRAVENOUS | Status: DC
  Administered 2024-01-28: 0.7 [IU]/h via INTRAVENOUS
  Filled 2024-01-28: qty 100

## 2024-01-28 MED ORDER — DEXTROSE 5 % IV SOLN
INTRAVENOUS | Status: DC
Start: 2024-01-28 — End: 2024-01-29

## 2024-01-28 MED ORDER — DEXTROSE 5 % IV SOLN: INTRAVENOUS | Status: DC

## 2024-01-28 MED ORDER — HYDROMORPHONE HCL 1 MG/ML IJ SOLN
0.5000 mg | INTRAMUSCULAR | Status: DC | PRN
  Administered 2024-01-28 (×2): 0.5 mg via INTRAVENOUS
  Filled 2024-01-28 (×3): qty 0.5

## 2024-01-28 MED ORDER — OXYCODONE-ACETAMINOPHEN 5-325 MG PO TABS
ORAL_TABLET | ORAL | Status: AC
  Filled 2024-01-28: qty 1

## 2024-01-28 MED ORDER — ALUM & MAG HYDROXIDE-SIMETH 200-200-20 MG/5ML PO SUSP
30.0000 mL | Freq: Once | ORAL | Status: AC
  Administered 2024-01-28: 30 mL via ORAL
  Filled 2024-01-28: qty 30

## 2024-01-28 MED ORDER — FENTANYL CITRATE PF 50 MCG/ML IJ SOSY
PREFILLED_SYRINGE | INTRAMUSCULAR | Status: AC
  Filled 2024-01-28: qty 1

## 2024-01-28 MED ORDER — DEXTROSE 50 % IV SOLN
0.0000 mL | INTRAVENOUS | Status: DC | PRN
  Administered 2024-01-30: 50 mL via INTRAVENOUS
  Filled 2024-01-28: qty 50

## 2024-01-28 NOTE — Progress Notes (Signed)
 Beta hydroxy elevated again with high AG. Restart insulin  drip.   Chistine Dematteo

## 2024-01-28 NOTE — Progress Notes (Addendum)
 Hypoglycemic Event  CBG: 49 @0140   Treatment: D50 50 mL (25 gm)  Symptoms: Weakness per patient  Follow-up CBG: Time:0206 CBG Result:169  Possible Reasons for Event: Inadequate meal intake  Comments/MD notified: MD previously made aware    Fidela Stank

## 2024-01-28 NOTE — Inpatient Diabetes Management (Signed)
 Inpatient Diabetes Program Recommendations  AACE/ADA: New Consensus Statement on Inpatient Glycemic Control (2015)  Target Ranges:  Prepandial:   less than 140 mg/dL      Peak postprandial:   less than 180 mg/dL (1-2 hours)      Critically ill patients:  140 - 180 mg/dL   Lab Results  Component Value Date   GLUCAP 275 (H) 01/28/2024   HGBA1C 10.8 (H) 12/20/2023    Review of Glycemic Control  Diabetes history: DM1 Outpatient Diabetes medications: Lantus  10 every day, Novolog  5 units TID Current orders for Inpatient glycemic control: Lantus  10 every day, Novolog  0-9 TID + 2 units TID  Inpatient Diabetes Program Recommendations:    Discharge Recommendations: Long acting recommendations: Insulin  Glargine (LANTUS ) Solostar Pen 10 units daily  Short acting recommendations:  Meal coverage ONLY Insulin  aspart (NOVOLOG ) FlexPen  5 units TID   Supply/Referral recommendations: Pen needles - standard Test strips Lancets   Use Adult Diabetes Insulin  Treatment Post Discharge order set  Well known to the inpatient diabetes team Multiple admissions  Leaves AMA sometimes Patient has had 15 admits in 2025 so far and has been seen multiple times by DM coordinator.  Unsure how to help patient with her DM.  Needs close follow-up with PCP and endocrinology.  Continue to follow.  Thank you. Shona Brandy, RD, LDN, CDCES Inpatient Diabetes Coordinator [phone]

## 2024-01-28 NOTE — Progress Notes (Signed)
 PROGRESS NOTE    Ashley Freeman  FMW:[id number] DOB: [birth year] DOA: 01/26/2024 PCP: Keven Crumbly Pap, MD    Brief Narrative:   33 year old female with history of HTN, HLD, ESRD on HD MWF, CHF with reduced EF, DM1, bipolar disorder, noncompliance with multiple admissions for DKA/fluid overload admitted for nausea and vomiting.  Was recently in the ER on 9/26 for shortness of breath after missing dialysis.  Symptoms improved after HD session therefore discharged home.  Now noted to be hyperglycemic with elevated anion gap and beta hydroxybutyrate acid.  Started on insulin  drip.  Assessment & Plan:   DKA, type I Insulin -dependent diabetes mellitus type 1 -A1c 10.8.  Blood glucose now less than 150 but acidosis has not yet resolved therefore we will continue insulin  drip/Endo tool.  Hopefully later will transition.  Dialysis should help acidosis as well -Periodic lab work, consult Diabetic coordinator.  Wonder periodic hyoglycemia is just from poor perfusion.    SIRS Lactic acidosis In the setting of DKA.  No acute infection identified.  Will continue to monitor this   Hematemesis with nausea Acute.  Suspect could be mild Mallory-Weiss/gastritis.  Continue PPI twice daily   Anemia of chronic disease Stable hemoglobin baseline 9.5   Prolonged QT interval Chronic.  QTc 543.   ESRD on HD Last HD 9/27.  Nephrology has been consulted   Heart failure with reduced ejection fraction, EF 35% Closely monitor volume status in the setting of DKA.  Resume home medications as appropriate   Essential hypertension - Continue Bumex , Coreg , Entresto   Bipolar disorder - Continue Cymbalta     DVT prophylaxis: SCDs Start: 01/26/24 1545      Code Status: Full Code Family Communication:   Status is: DKA management.    PT Follow up Recs:   Subjective:  No complaints, seen in HD. Overnight had some asymptomatic hypoglycemia.   Examination:  General exam: Appears calm  and comfortable, drowsy, chronically ill Respiratory system: Clear to auscultation. Respiratory effort normal. Cardiovascular system: S1 & S2 heard, RRR. No JVD, murmurs, rubs, gallops or clicks. No pedal edema. Gastrointestinal system: Abdomen is nondistended, soft and nontender. No organomegaly or masses felt. Normal bowel sounds heard. Central nervous system: Alert and oriented. No focal neurological deficits. Extremities: Symmetric 5 x 5 power. Skin: No rashes, lesions or ulcers Psychiatry: Judgement and insight appear normal. Mood & affect appropriate. Right upper extremity fistula               Diet Orders (From admission, onward)     Start     Ordered   01/27/24 1215  Diet Carb Modified Fluid consistency: Thin; Room service appropriate? Yes; Fluid restriction: 1500 mL Fluid  Diet effective now       Question Answer Comment  Diet-HS Snack? Nothing   Calorie Level Medium 1600-2000   Fluid consistency: Thin   Room service appropriate? Yes   Fluid restriction: 1500 mL Fluid      01/27/24 1214            Objective: Vitals:   01/28/24 0900 01/28/24 0915 01/28/24 0920 01/28/24 0953  BP: (!) 94/40 (!) 98/46 (!) 116/53 (!) 110/51  Pulse: 73 67 66   Resp: (!) 22 12 14 13   Temp:   98.2 F (36.8 C) (!) 97.5 F (36.4 C)  TempSrc:    Oral  SpO2:    97%  Weight:   61.2 kg   Height:        Intake/Output Summary (Last  24 hours) at 01/28/2024 1058 Last data filed at 01/28/2024 0920 Gross per 24 hour  Intake 670.54 ml  Output 3900 ml  Net -3229.46 ml   Filed Weights   01/27/24 1111 01/28/24 0357 01/28/24 0920  Weight: 64.8 kg 64.5 kg 61.2 kg    Scheduled Meds:  calcitRIOL   1.25 mcg Oral Q M,W,F   carvedilol   25 mg Oral BID WC   Chlorhexidine  Gluconate Cloth  6 each Topical Q0600   DULoxetine   20 mg Oral Daily   insulin  aspart  0-9 Units Subcutaneous TID WC   insulin  aspart  2 Units Subcutaneous TID WC   insulin  glargine  10 Units Subcutaneous Daily    lamoTRIgine   200 mg Oral Daily   pantoprazole  (PROTONIX ) IV  40 mg Intravenous Q12H   rosuvastatin   40 mg Oral Daily   sacubitril-valsartan  1 tablet Oral BID   sodium bicarbonate   650 mg Oral BID   sodium chloride  flush  3 mL Intravenous Q12H   Continuous Infusions:  dextrose  40 mL/hr at 01/27/24 1931    Nutritional status     Body mass index is 23.9 kg/m.  Data Reviewed:   CBC: Recent Labs  Lab 01/24/24 2356 01/26/24 1218 01/26/24 1829 01/27/24 0236 01/28/24 0520  WBC 10.9* 15.5*  --  17.2* 15.4*  NEUTROABS 4.8 12.8*  --   --   --   HGB 12.1 9.6* 8.7* 9.1* 9.7*  HCT 39.4 32.7* 30.8* 28.7* 30.5*  MCV 89.3 94.5  --  86.2 85.4  PLT 359 340  --  342 268   Basic Metabolic Panel: Recent Labs  Lab 01/26/24 1218 01/26/24 1829 01/27/24 0236 01/27/24 0835 01/27/24 1403 01/27/24 1948 01/28/24 0520  NA 136   < > 141 142 138 139 139  K 5.1   < > 3.4* 4.4 3.9 4.0 3.7  CL 92*   < > 96* 99 100 100 99  CO2 <7*   < > 18* 15* 21* 23 25  GLUCOSE 589*   < > 150* 100* 90 92 97  BUN 54*   < > 59* 63* 60* 60* 61*  CREATININE 8.22*   < > 8.47* 7.95* 8.10* 8.12* 8.20*  CALCIUM  8.9   < > 8.7* 8.9 8.1* 8.3* 8.2*  MG  --   --   --   --   --   --  2.7*  PHOS 9.6*  --   --   --   --   --  7.9*   < > = values in this interval not displayed.   GFR: Estimated Creatinine Clearance: 8.2 mL/min (A) (by C-G formula based on SCr of 8.2 mg/dL (H)). Liver Function Tests: Recent Labs  Lab 01/26/24 1218  AST 18  ALT 34  ALKPHOS 558*  BILITOT 3.1*  PROT 6.2*  ALBUMIN  3.4*   No results for input(s): LIPASE, AMYLASE in the last 168 hours. No results for input(s): AMMONIA in the last 168 hours. Coagulation Profile: No results for input(s): INR, PROTIME in the last 168 hours. Cardiac Enzymes: Recent Labs  Lab 01/26/24 1218  CKTOTAL 143   BNP (last 3 results) No results for input(s): PROBNP in the last 8760 hours. HbA1C: No results for input(s): HGBA1C in the last 72  hours. CBG: Recent Labs  Lab 01/27/24 1711 01/27/24 2107 01/28/24 0140 01/28/24 0206 01/28/24 1012  GLUCAP 156* 82 49* 169* 240*   Lipid Profile: No results for input(s): CHOL, HDL, LDLCALC, TRIG, CHOLHDL, LDLDIRECT in the last  72 hours. Thyroid  Function Tests: No results for input(s): TSH, T4TOTAL, FREET4, T3FREE, THYROIDAB in the last 72 hours. Anemia Panel: No results for input(s): VITAMINB12, FOLATE, FERRITIN, TIBC, IRON , RETICCTPCT in the last 72 hours. Sepsis Labs: Recent Labs  Lab 01/26/24 1229  LATICACIDVEN 2.7*    Recent Results (from the past 240 hours)  Resp panel by RT-PCR (RSV, Flu A&B, Covid) Anterior Nasal Swab     Status: None   Collection Time: 01/24/24 11:56 PM   Specimen: Anterior Nasal Swab  Result Value Ref Range Status   SARS Coronavirus 2 by RT PCR NEGATIVE NEGATIVE Final   Influenza A by PCR NEGATIVE NEGATIVE Final   Influenza B by PCR NEGATIVE NEGATIVE Final    Comment: (NOTE) The Xpert Xpress SARS-CoV-2/FLU/RSV plus assay is intended as an aid in the diagnosis of influenza from Nasopharyngeal swab specimens and should not be used as a sole basis for treatment. Nasal washings and aspirates are unacceptable for Xpert Xpress SARS-CoV-2/FLU/RSV testing.  Fact Sheet for Patients: BloggerCourse.com  Fact Sheet for Healthcare Providers: SeriousBroker.it  This test is not yet approved or cleared by the [place]  FDA and has been authorized for detection and/or diagnosis of SARS-CoV-2 by FDA under an Emergency Use Authorization (EUA). This EUA will remain in effect (meaning this test can be used) for the duration of the COVID-19 declaration under Section 564(b)(1) of the Act, 21 U.S.C. section 360bbb-3(b)(1), unless the authorization is terminated or revoked.     Resp Syncytial Virus by PCR NEGATIVE NEGATIVE Final    Comment: (NOTE) Fact Sheet for  Patients: BloggerCourse.com  Fact Sheet for Healthcare Providers: SeriousBroker.it  This test is not yet approved or cleared by the [place]  FDA and has been authorized for detection and/or diagnosis of SARS-CoV-2 by FDA under an Emergency Use Authorization (EUA). This EUA will remain in effect (meaning this test can be used) for the duration of the COVID-19 declaration under Section 564(b)(1) of the Act, 21 U.S.C. section 360bbb-3(b)(1), unless the authorization is terminated or revoked.  Performed at [facility], [address] [place], [place] [address]   Resp panel by RT-PCR (RSV, Flu A&B, Covid) Anterior Nasal Swab     Status: None   Collection Time: 01/26/24 12:24 PM   Specimen: Anterior Nasal Swab  Result Value Ref Range Status   SARS Coronavirus 2 by RT PCR NEGATIVE NEGATIVE Final   Influenza A by PCR NEGATIVE NEGATIVE Final   Influenza B by PCR NEGATIVE NEGATIVE Final    Comment: (NOTE) The Xpert Xpress SARS-CoV-2/FLU/RSV plus assay is intended as an aid in the diagnosis of influenza from Nasopharyngeal swab specimens and should not be used as a sole basis for treatment. Nasal washings and aspirates are unacceptable for Xpert Xpress SARS-CoV-2/FLU/RSV testing.  Fact Sheet for Patients: BloggerCourse.com  Fact Sheet for Healthcare Providers: SeriousBroker.it  This test is not yet approved or cleared by the [place]  FDA and has been authorized for detection and/or diagnosis of SARS-CoV-2 by FDA under an Emergency Use Authorization (EUA). This EUA will remain in effect (meaning this test can be used) for the duration of the COVID-19 declaration under Section 564(b)(1) of the Act, 21 U.S.C. section 360bbb-3(b)(1), unless the authorization is terminated or revoked.     Resp Syncytial Virus by PCR NEGATIVE NEGATIVE Final    Comment: (NOTE) Fact Sheet  for Patients: BloggerCourse.com  Fact Sheet for Healthcare Providers: SeriousBroker.it  This test is not yet approved or cleared by the [place]  [place] FDA and has been authorized for detection and/or diagnosis of SARS-CoV-2 by FDA under an Emergency Use Authorization (EUA). This EUA will remain in effect (meaning this test can be used) for the duration of the COVID-19 declaration under Section 564(b)(1) of the Act, 21 U.S.C. section 360bbb-3(b)(1), unless the authorization is terminated or revoked.  Performed at [facility], [address], [place], [place] [address]   Blood culture (routine x 2)     Status: None (Preliminary result)   Collection Time: 01/26/24 12:35 PM   Specimen: BLOOD LEFT ARM  Result Value Ref Range Status   Specimen Description BLOOD LEFT ARM  Final   Special Requests   Final    BOTTLES DRAWN AEROBIC AND ANAEROBIC Blood Culture results may not be optimal due to an inadequate volume of blood received in culture bottles   Culture   Final    NO GROWTH 2 DAYS Performed at [facility], [address] [place], [place] [address]    Report Status PENDING  Incomplete  Blood culture (routine x 2)     Status: None (Preliminary result)   Collection Time: 01/26/24  6:29 PM   Specimen: BLOOD LEFT HAND  Result Value Ref Range Status   Specimen Description BLOOD LEFT HAND  Final   Special Requests   Final    BOTTLES DRAWN AEROBIC AND ANAEROBIC Blood Culture adequate volume   Culture   Final    NO GROWTH 2 DAYS Performed at [facility], [address] [place], [place] [address]    Report Status PENDING  Incomplete  MRSA Next Gen by PCR, Nasal     Status: None   Collection Time: 01/26/24  7:26 PM   Specimen: Nasal Mucosa; Nasal Swab  Result Value Ref Range Status   MRSA by PCR Next Gen NOT DETECTED NOT DETECTED Final    Comment: (NOTE) The GeneXpert MRSA Assay (FDA approved for NASAL specimens  only), is one component of a comprehensive MRSA colonization surveillance program. It is not intended to diagnose MRSA infection nor to guide or monitor treatment for MRSA infections. Test performance is not FDA approved in patients less than 89 years old. Performed at [facility], [address] [place], [place] [address]          Radiology Studies: DG Chest Portable 1 View Result Date: 01/26/2024 EXAM: 1 VIEW(S) XRAY OF THE CHEST 01/26/2024 12:45:00 PM COMPARISON: 01/24/2024 CLINICAL HISTORY: weakness. Reason for exam:weakness; Triage notes: Pt presents to ED via EMS from home with complaint of vomiting black stuff, no black stool began last night. Weakness. Dialyzed yesterday, missed an appointment or two due to transportation issues. FINDINGS: LUNGS AND PLEURA: Low lung volumes. No focal pulmonary opacity. No pulmonary edema. No pleural effusion. No pneumothorax. HEART AND MEDIASTINUM: Persistent cardiomegaly. No acute abnormality of the mediastinal silhouette. BONES AND SOFT TISSUES: No acute osseous abnormality. IMPRESSION: 1. No acute cardiopulmonary process identified. Electronically signed by: Waddell Calk MD 01/26/2024 01:06 PM EDT RP Workstation: [id number]           LOS: 1 day   Time spent= 35 mins    Burgess JAYSON Dare, MD [facility]  If 7PM-7AM, please contact night-coverage  01/28/2024, 10:58 AM

## 2024-01-28 NOTE — TOC Initial Note (Signed)
 Transition of Care [facility]) - Initial/Assessment Note    Patient Details  Name: Ashley Freeman MRN: [id number] Date of Birth: August 02, 1990  Transition of Care [facility]) CM/SW Contact:    Lauraine FORBES Saa, LCSWA Phone Number: 01/28/2024, 3:20 PM  Clinical Narrative:                  3:21 PM TOC familiar with patient. CSW attempted to speak with patient, but patient appeared asleep, and CSW could not wake patient. CSW spoke with patient's mother, who confirmed that she resides with patient and patient's daughter. Patient's mother said she or patient's aunt could provide transportation at discharge. Patient's mother informed CSW that patient uses Medicaid transport to attend appointments. Patient's mother confirmed patient has HH history with [facility]. Patient's mother confirmed patient does not have SNF history. Patient's mother confirmed patient has RW at home (per chart review, from [facility]). Per chart review, patient has a PCP and insurance. Patient's preferred pharmacy's are [facility] and [facility] [id number]. Per progressions, patient is not yet medically ready for discharge. TOC will continue to follow and be available to assist.  Expected Discharge Plan: Home/Self Care Barriers to Discharge: Continued Medical Work up   Patient Goals and CMS Choice            Expected Discharge Plan and Services       Living arrangements for the past 2 months: Apartment                                      Prior Living Arrangements/Services Living arrangements for the past 2 months: Apartment Lives with:: Parents, Minor Children Patient language and need for interpreter reviewed:: Yes        Need for Family Participation in Patient Care: No (Comment)   Current home services: DME Criminal Activity/Legal Involvement Pertinent to Current Situation/Hospitalization: No - Comment as needed  Activities of Daily Living   ADL Screening  (condition at time of admission) Independently performs ADLs?: Yes (appropriate for developmental age) Is the patient deaf or have difficulty hearing?: No Does the patient have difficulty seeing, even when wearing glasses/contacts?: No Does the patient have difficulty concentrating, remembering, or making decisions?: No  Permission Sought/Granted Permission sought to share information with : Family Supports Permission granted to share information with : No (Contact information on chart)  Share Information with NAME: Lela Berber     Permission granted to share info w Relationship: Mother  Permission granted to share info w Contact Information: [phone]  Emotional Assessment Appearance:: Appears stated age Attitude/Demeanor/Rapport: Lethargic Affect (typically observed): Stable, Calm Orientation: : Oriented to Self, Oriented to Place, Oriented to  Time, Oriented to Situation Alcohol / Substance Use: Not Applicable Psych Involvement: No (comment)  Admission diagnosis:  Hyperglycemia [R73.9] DKA, type 1 (HCC) [E10.10] Hematemesis, unspecified whether nausea present [K92.0] Patient Active Problem List   Diagnosis Date Noted   Hematemesis with nausea 01/26/2024   High anion gap metabolic acidosis 01/16/2024   Allergic rhinitis 01/16/2024   History of anemia due to chronic kidney disease 01/16/2024   Hypervolemia associated with renal insufficiency 12/19/2023   Pain and swelling of right upper extremity 12/19/2023   History of seizure disorder 11/17/2023   Hyperosmolar hyperglycemic state (HHS) (HCC) 11/03/2023   Volume overload 11/03/2023   Vulvar pain 11/03/2023   Generalized abdominal pain 08/27/2023   Elevated LFTs 07/20/2023  Cholecystitis 06/30/2023   Prolonged QT interval 06/30/2023   Bipolar disorder (HCC) 06/30/2023   Hyperglycemic crisis due to Type 1 diabetes mellitus (HCC) 05/20/2023   Acute on chronic HFrEF (heart failure with reduced ejection fraction)  (HCC) 05/20/2023   Chronic combined systolic and diastolic heart failure (HCC) 05/20/2023   Generalized pain 05/20/2023   Hypoglycemia 09/08/2022   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) 05/26/2021   Dyslipidemia 05/26/2021   Anasarca 04/16/2021   Mild protein malnutrition 03/07/2021   DKA, type 1 (HCC) 12/08/2020   Type 1 diabetes mellitus (HCC) 02/08/2020   Leg edema, left 09/27/2019   Systolic ejection murmur 08/03/2019   Type 1 diabetes mellitus with hyperglycemia (HCC) 07/24/2019   Diabetic retinopathy (HCC) 07/02/2019   DM (diabetes mellitus), type 1 with renal complications (HCC) 05/21/2019   Diarrhea 05/31/2016   Diabetic neuropathy (HCC) 01/16/2016   Preop cardiovascular exam 08/24/2013   Pseudohyponatremia 08/04/2012   Hypokalemia 05/07/2012   Lactic acidosis 02/19/2012   Leukocytosis 02/19/2012   Normocytic anemia 02/19/2012   Hyperkalemia 02/19/2012   Hyperglycemia 11/06/2011   Stable proliferative diabetic retinopathy associated with type 1 diabetes mellitus (HCC) 10/13/1997   PCP:  Keven Crumbly Pap, MD Pharmacy:   [facility] - [place] - [address] [place]) [place] [address] Phone: [phone] Fax:  [phone]  [facility] [address] [place] [address] Phone: [phone] Fax: [phone]     Social Drivers of Health (SDOH) Social History: SDOH Screenings   Food Insecurity: No Food Insecurity (01/26/2024)  Housing: Low Risk  (01/26/2024)  Transportation Needs: No Transportation Needs (01/26/2024)  Recent Concern: Transportation Needs - Unmet Transportation Needs (12/22/2023)  Utilities: Not At Risk (01/26/2024)  Depression (PHQ2-9): Medium Risk (03/02/2020)  Financial Resource Strain: Medium Risk (11/15/2023)   [facility] from [facility]  Physical Activity: Insufficiently Active (03/02/2022)   Received from [facility]  Social Connections: Moderately Integrated (12/22/2023)  Stress: No Stress Concern Present (03/08/2023)   Received from [facility]  Tobacco Use: Low Risk  (01/26/2024)   SDOH Interventions:     Readmission Risk Interventions    11/28/2023   11:49 AM 10/29/2023   10:23 AM 07/23/2023    4:25 PM  Readmission Risk Prevention Plan  Transportation Screening Complete Complete Complete  Medication Review (RN Care Manager) Complete Complete Referral to Pharmacy  PCP or Specialist appointment within 3-5 days of discharge Complete Complete Complete  HRI or Home Care Consult Complete Complete Complete  SW Recovery Care/Counseling Consult  Complete Complete  Palliative Care Screening Not Applicable Not Applicable Not Applicable  Skilled Nursing Facility Not Applicable Not Applicable Not Applicable

## 2024-01-28 NOTE — Progress Notes (Signed)
 [facility] Progress Note   Subjective:   Seen on HD earlier this AM - nearly 4L UFG and tolerated, BP low at very end of HD. Still nauseated.  Objective Vitals:   01/28/24 0900 01/28/24 0915 01/28/24 0920 01/28/24 0953  BP: (!) 94/40 (!) 98/46 (!) 116/53 (!) 110/51  Pulse: 73 67 66   Resp: (!) 22 12 14 13   Temp:   98.2 F (36.8 C) (!) 97.5 F (36.4 C)  TempSrc:    Oral  SpO2:    97%  Weight:   61.2 kg   Height:       Physical Exam General: Ill appearing, NAD. Raymond O2 in place. Holding emesis basin. Heart: RRR Lungs: CTAB Abdomen: soft, but distended Extremities: 1+ BLE edema Dialysis Access: AVF +t/b  Additional Objective Labs: Basic Metabolic Panel: Recent Labs  Lab 01/26/24 1218 01/26/24 1829 01/27/24 1403 01/27/24 1948 01/28/24 0520  NA 136   < > 138 139 139  K 5.1   < > 3.9 4.0 3.7  CL 92*   < > 100 100 99  CO2 <7*   < > 21* 23 25  GLUCOSE 589*   < > 90 92 97  BUN 54*   < > 60* 60* 61*  CREATININE 8.22*   < > 8.10* 8.12* 8.20*  CALCIUM  8.9   < > 8.1* 8.3* 8.2*  PHOS 9.6*  --   --   --  7.9*   < > = values in this interval not displayed.   Liver Function Tests: Recent Labs  Lab 01/26/24 1218  AST 18  ALT 34  ALKPHOS 558*  BILITOT 3.1*  PROT 6.2*  ALBUMIN  3.4*   CBC: Recent Labs  Lab 01/24/24 2356 01/26/24 1218 01/26/24 1829 01/27/24 0236 01/28/24 0520  WBC 10.9* 15.5*  --  17.2* 15.4*  NEUTROABS 4.8 12.8*  --   --   --   HGB 12.1 9.6* 8.7* 9.1* 9.7*  HCT 39.4 32.7* 30.8* 28.7* 30.5*  MCV 89.3 94.5  --  86.2 85.4  PLT 359 340  --  342 268   Studies/Results: DG Chest Portable 1 View Result Date: 01/26/2024 EXAM: 1 VIEW(S) XRAY OF THE CHEST 01/26/2024 12:45:00 PM COMPARISON: 01/24/2024 CLINICAL HISTORY: weakness. Reason for exam:weakness; Triage notes: Pt presents to ED via EMS from home with complaint of vomiting black stuff, no black stool began last night. Weakness. Dialyzed yesterday, missed an appointment or two due to  transportation issues. FINDINGS: LUNGS AND PLEURA: Low lung volumes. No focal pulmonary opacity. No pulmonary edema. No pleural effusion. No pneumothorax. HEART AND MEDIASTINUM: Persistent cardiomegaly. No acute abnormality of the mediastinal silhouette. BONES AND SOFT TISSUES: No acute osseous abnormality. IMPRESSION: 1. No acute cardiopulmonary process identified. Electronically signed by: Taylor Stroud MD 01/26/2024 01:06 PM EDT RP Workstation: [id number]   Medications:  dextrose  40 mL/hr at 01/27/24 1931    calcitRIOL   1.25 mcg Oral Q M,W,F   carvedilol   25 mg Oral BID WC   Chlorhexidine  Gluconate Cloth  6 each Topical Q0600   DULoxetine   20 mg Oral Daily   insulin  aspart  0-9 Units Subcutaneous TID WC   insulin  aspart  2 Units Subcutaneous TID WC   insulin  glargine  10 Units Subcutaneous Daily   lamoTRIgine   200 mg Oral Daily   pantoprazole  (PROTONIX ) IV  40 mg Intravenous Q12H   rosuvastatin   40 mg Oral Daily   sacubitril-valsartan  1 tablet Oral BID   sodium bicarbonate   650  mg Oral BID   sodium chloride  flush  3 mL Intravenous Q12H   Dialysis Orders MWF - [facility] ([address]) -> last dialyzed there on 01/07/24 4 hours, EDW 61kg, AVG  Assessment/Plan: DKA, T1DM: BS started very high, with significant acidosis. Now on slow D5W drip to maintain. Last A1c 10.8%.  Hematemesis: ?d/t vomiting, ongoing nausea. ESRD: Usual MWF schedule - last HD 9/27 here, hasn't been to her unit in 3 weeks. Finishing HD now - 4L off. Will try to run tomorrow if still here to get back on her usual schedule. HTN/volume: BP better, to EDW after today's HD, still seems volume up. Further UF with next HD. 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 HFrEF (30-35%)    Izetta Boehringer, PA-C 01/28/2024, 11:09 AM  [employer]

## 2024-01-28 NOTE — Plan of Care (Signed)

## 2024-01-28 NOTE — Progress Notes (Signed)
   01/28/24 0920  Vitals  Temp 98.2 F (36.8 C)  Pulse Rate 66  Resp 14  BP (!) 116/53  Weight 61.2 kg  Type of Weight Post-Dialysis  Oxygen Therapy  O2 Flow Rate (L/min) 2 L/min  Patient Activity (if Appropriate) In bed  Pulse Oximetry Type Continuous  Oximetry Probe Site Changed No  Post Treatment  Dialyzer Clearance Lightly streaked  Hemodialysis Intake (mL) 0 mL  Liters Processed 82.9  Fluid Removed (mL) 3900 mL  Tolerated HD Treatment Yes  Post-Hemodialysis Comments UFG NOT MET DUE TO BP DROP  AVG/AVF Arterial Site Held (minutes) 10 minutes  AVG/AVF Venous Site Held (minutes) 10 minutes   Received patient in bed to unit.  Alert and oriented.  Informed consent signed and in chart.   TX duration:3.5HRS  Patient tolerated well.  Transported back to the room  Alert, without acute distress.  Hand-off given to patient's nurse.   Access used: RAVF Access issues: NONE  Total UF removed: 3.9L Medication(s) given: PAIN MEDS    [facility]

## 2024-01-29 DIAGNOSIS — R739 Hyperglycemia, unspecified: Secondary | ICD-10-CM

## 2024-01-29 LAB — CBC WITH DIFFERENTIAL/PLATELET
Abs Immature Granulocytes: 0.06 K/uL (ref 0.00–0.07)
Basophils Absolute: 0 K/uL (ref 0.0–0.1)
Basophils Relative: 0 %
Eosinophils Absolute: 0.2 K/uL (ref 0.0–0.5)
Eosinophils Relative: 2 %
HCT: 34 % — ABNORMAL LOW (ref 36.0–46.0)
Hemoglobin: 10.3 g/dL — ABNORMAL LOW (ref 12.0–15.0)
Immature Granulocytes: 1 %
Lymphocytes Relative: 27 %
Lymphs Abs: 3 K/uL (ref 0.7–4.0)
MCH: 27.4 pg (ref 26.0–34.0)
MCHC: 30.3 g/dL (ref 30.0–36.0)
MCV: 90.4 fL (ref 80.0–100.0)
Monocytes Absolute: 0.6 K/uL (ref 0.1–1.0)
Monocytes Relative: 5 %
Neutro Abs: 7.4 K/uL (ref 1.7–7.7)
Neutrophils Relative %: 65 %
Platelets: 180 K/uL (ref 150–400)
RBC: 3.76 MIL/uL — ABNORMAL LOW (ref 3.87–5.11)
RDW: 15.7 % — ABNORMAL HIGH (ref 11.5–15.5)
Smear Review: NORMAL
WBC: 11.3 K/uL — ABNORMAL HIGH (ref 4.0–10.5)
nRBC: 0.2 % (ref 0.0–0.2)

## 2024-01-29 LAB — BASIC METABOLIC PANEL WITH GFR
Anion gap: 14 (ref 5–15)
Anion gap: 18 — ABNORMAL HIGH (ref 5–15)
Anion gap: 18 — ABNORMAL HIGH (ref 5–15)
BUN: 33 mg/dL — ABNORMAL HIGH (ref 6–20)
BUN: 34 mg/dL — ABNORMAL HIGH (ref 6–20)
BUN: 35 mg/dL — ABNORMAL HIGH (ref 6–20)
CO2: 26 mmol/L (ref 22–32)
CO2: 27 mmol/L (ref 22–32)
CO2: 21 mmol/L — ABNORMAL LOW (ref 22–32)
Calcium: 8.2 mg/dL — ABNORMAL LOW (ref 8.9–10.3)
Calcium: 8.6 mg/dL — ABNORMAL LOW (ref 8.9–10.3)
Calcium: 8.1 mg/dL — ABNORMAL LOW (ref 8.9–10.3)
Chloride: 96 mmol/L — ABNORMAL LOW (ref 98–111)
Chloride: 91 mmol/L — ABNORMAL LOW (ref 98–111)
Chloride: 95 mmol/L — ABNORMAL LOW (ref 98–111)
Creatinine, Ser: 5.64 mg/dL — ABNORMAL HIGH (ref 0.44–1.00)
Creatinine, Ser: 5.78 mg/dL — ABNORMAL HIGH (ref 0.44–1.00)
Creatinine, Ser: 6.05 mg/dL — ABNORMAL HIGH (ref 0.44–1.00)
GFR, Estimated: 10 mL/min — ABNORMAL LOW (ref >60)
GFR, Estimated: 9 mL/min — ABNORMAL LOW (ref >60)
GFR, Estimated: 9 mL/min — ABNORMAL LOW (ref >60)
Glucose, Bld: 101 mg/dL — ABNORMAL HIGH (ref 70–99)
Glucose, Bld: 142 mg/dL — ABNORMAL HIGH (ref 70–99)
Glucose, Bld: 164 mg/dL — ABNORMAL HIGH (ref 70–99)
Potassium: 4.1 mmol/L (ref 3.5–5.1)
Potassium: 4.2 mmol/L (ref 3.5–5.1)
Potassium: 4.7 mmol/L (ref 3.5–5.1)
Sodium: 136 mmol/L (ref 135–145)
Sodium: 136 mmol/L (ref 135–145)
Sodium: 134 mmol/L — ABNORMAL LOW (ref 135–145)

## 2024-01-29 LAB — HEPATIC FUNCTION PANEL
ALT: 20 U/L (ref 0–44)
AST: 35 U/L (ref 15–41)
Albumin: 2.4 g/dL — ABNORMAL LOW (ref 3.5–5.0)
Alkaline Phosphatase: 493 U/L — ABNORMAL HIGH (ref 38–126)
Bilirubin, Direct: 0.3 mg/dL — ABNORMAL HIGH (ref 0.0–0.2)
Indirect Bilirubin: 0.7 mg/dL (ref 0.3–0.9)
Total Bilirubin: 1 mg/dL (ref 0.0–1.2)
Total Protein: 4.6 g/dL — ABNORMAL LOW (ref 6.5–8.1)

## 2024-01-29 LAB — GLUCOSE, CAPILLARY
Glucose-Capillary: 106 mg/dL — ABNORMAL HIGH (ref 70–99)
Glucose-Capillary: 110 mg/dL — ABNORMAL HIGH (ref 70–99)
Glucose-Capillary: 115 mg/dL — ABNORMAL HIGH (ref 70–99)
Glucose-Capillary: 119 mg/dL — ABNORMAL HIGH (ref 70–99)
Glucose-Capillary: 151 mg/dL — ABNORMAL HIGH (ref 70–99)
Glucose-Capillary: 177 mg/dL — ABNORMAL HIGH (ref 70–99)
Glucose-Capillary: 52 mg/dL — ABNORMAL LOW (ref 70–99)
Glucose-Capillary: 74 mg/dL (ref 70–99)
Glucose-Capillary: 75 mg/dL (ref 70–99)
Glucose-Capillary: 76 mg/dL (ref 70–99)
Glucose-Capillary: 84 mg/dL (ref 70–99)
Glucose-Capillary: 91 mg/dL (ref 70–99)
Glucose-Capillary: 99 mg/dL (ref 70–99)

## 2024-01-29 LAB — TROPONIN I (HIGH SENSITIVITY)
Troponin I (High Sensitivity): 14 ng/L (ref <18)
Troponin I (High Sensitivity): 14 ng/L (ref <18)

## 2024-01-29 LAB — BETA-HYDROXYBUTYRIC ACID
Beta-Hydroxybutyric Acid: 0.1 mmol/L (ref 0.05–0.27)
Beta-Hydroxybutyric Acid: 1.19 mmol/L — ABNORMAL HIGH (ref 0.05–0.27)
Beta-Hydroxybutyric Acid: 0.6 mmol/L — ABNORMAL HIGH (ref 0.05–0.27)

## 2024-01-29 LAB — LACTIC ACID, PLASMA: Lactic Acid, Venous: 1 mmol/L (ref 0.5–1.9)

## 2024-01-29 LAB — MAGNESIUM: Magnesium: 2.2 mg/dL (ref 1.7–2.4)

## 2024-01-29 MED ORDER — ALUM & MAG HYDROXIDE-SIMETH 200-200-20 MG/5ML PO SUSP
30.0000 mL | ORAL | Status: DC | PRN
  Administered 2024-01-29 – 2024-01-31 (×3): 30 mL via ORAL
  Filled 2024-01-29 (×3): qty 30

## 2024-01-29 MED ORDER — OXYCODONE HCL 5 MG PO TABS
5.0000 mg | ORAL_TABLET | ORAL | Status: DC | PRN
  Administered 2024-01-29 – 2024-01-31 (×3): 5 mg via ORAL
  Filled 2024-01-29 (×3): qty 1

## 2024-01-29 MED ORDER — INSULIN GLARGINE 100 UNIT/ML ~~LOC~~ SOLN
10.0000 [IU] | Freq: Every day | SUBCUTANEOUS | Status: DC
  Filled 2024-01-29 (×2): qty 0.1

## 2024-01-29 MED ORDER — INSULIN ASPART 100 UNIT/ML IJ SOLN: 0.0000 [IU] | INTRAMUSCULAR | Status: DC

## 2024-01-29 MED ORDER — HYDROMORPHONE HCL 1 MG/ML IJ SOLN
0.5000 mg | INTRAMUSCULAR | Status: DC | PRN
  Administered 2024-01-29 – 2024-01-30 (×2): 0.5 mg via INTRAVENOUS
  Filled 2024-01-29 (×2): qty 0.5

## 2024-01-29 MED ORDER — HYDROXYZINE HCL 25 MG PO TABS
25.0000 mg | ORAL_TABLET | Freq: Three times a day (TID) | ORAL | Status: DC | PRN
  Administered 2024-01-29: 25 mg via ORAL
  Filled 2024-01-29 (×2): qty 1

## 2024-01-29 MED ORDER — LIDOCAINE 5 % EX PTCH: 1.0000 | MEDICATED_PATCH | CUTANEOUS | Status: DC

## 2024-01-29 MED ORDER — INSULIN ASPART 100 UNIT/ML IJ SOLN: 0.0000 [IU] | Freq: Three times a day (TID) | INTRAMUSCULAR | Status: DC

## 2024-01-29 MED ORDER — INSULIN GLARGINE 100 UNIT/ML ~~LOC~~ SOLN
10.0000 [IU] | Freq: Every day | SUBCUTANEOUS | Status: DC
Start: 2024-01-29 — End: 2024-01-29

## 2024-01-29 MED ORDER — INSULIN ASPART 100 UNIT/ML IJ SOLN
0.0000 [IU] | INTRAMUSCULAR | Status: DC
  Administered 2024-01-29 – 2024-01-30 (×3): 1 [IU] via SUBCUTANEOUS

## 2024-01-29 MED ORDER — MIDODRINE HCL 5 MG PO TABS
5.0000 mg | ORAL_TABLET | Freq: Once | ORAL | Status: AC
  Administered 2024-01-29: 5 mg via ORAL
  Filled 2024-01-29: qty 1

## 2024-01-29 MED ORDER — BUTALBITAL-APAP-CAFFEINE 50-325-40 MG PO TABS
1.0000 | ORAL_TABLET | Freq: Four times a day (QID) | ORAL | Status: DC | PRN
  Filled 2024-01-29: qty 1

## 2024-01-29 MED ORDER — POLYVINYL ALCOHOL 1.4 % OP SOLN
1.0000 [drp] | OPHTHALMIC | Status: DC | PRN
  Administered 2024-01-29 – 2024-01-31 (×6): 1 [drp] via OPHTHALMIC
  Filled 2024-01-29: qty 15

## 2024-01-29 MED ORDER — INSULIN GLARGINE 100 UNIT/ML ~~LOC~~ SOLN
10.0000 [IU] | Freq: Every day | SUBCUTANEOUS | Status: DC
  Administered 2024-01-29: 10 [IU] via SUBCUTANEOUS
  Filled 2024-01-29 (×2): qty 0.1

## 2024-01-29 MED ORDER — SIMETHICONE 40 MG/0.6ML PO SUSP
80.0000 mg | Freq: Once | ORAL | Status: AC
  Administered 2024-01-29: 80 mg via ORAL
  Filled 2024-01-29: qty 1.2

## 2024-01-29 NOTE — Plan of Care (Signed)

## 2024-01-29 NOTE — Progress Notes (Signed)
 [facility] Progress Note   Subjective:    Completed HD Tues -net UF 3.9L  Seen in room. Drowsy but able to reorient.  For dialysis again today   Objective Vitals:   01/28/24 1947 01/28/24 2310 01/29/24 0324 01/29/24 0800  BP: (!) 147/77 136/73 (!) 90/57 115/60  Pulse: 73 72 65 65  Resp: 19  17 16   Temp: 97.9 F (36.6 C) 97.6 F (36.4 C) (!) 97.5 F (36.4 C) 97.6 F (36.4 C)  TempSrc: Oral Oral Oral Oral  SpO2: 99% 99% 95% 96%  Weight:   60.9 kg   Height:       Physical Exam General: Ill appearing, NAD.  Heart: RRR Lungs: CTAB Abdomen: soft, but distended Extremities: 1+ BLE edema Dialysis Access: AVF +t/b  Additional Objective Labs: Basic Metabolic Panel: Recent Labs  Lab 01/26/24 1218 01/26/24 1829 01/28/24 0520 01/28/24 1120 01/28/24 1911 01/28/24 2329 01/29/24 0746  NA 136   < > 139   < > 137 136 136  K 5.1   < > 3.7   < > 3.8 4.1 4.2  CL 92*   < > 99   < > 97* 96* 91*  CO2 <7*   < > 25   < > 24 26 27   GLUCOSE 589*   < > 97   < > 154* 101* 142*  BUN 54*   < > 61*   < > 32* 33* 34*  CREATININE 8.22*   < > 8.20*   < > 5.60* 5.64* 5.78*  CALCIUM  8.9   < > 8.2*   < > 8.4* 8.2* 8.6*  PHOS 9.6*  --  7.9*  --   --   --   --    < > = values in this interval not displayed.   Liver Function Tests: Recent Labs  Lab 01/26/24 1218  AST 18  ALT 34  ALKPHOS 558*  BILITOT 3.1*  PROT 6.2*  ALBUMIN  3.4*   CBC: Recent Labs  Lab 01/24/24 2356 01/26/24 1218 01/26/24 1829 01/27/24 0236 01/28/24 0520 01/28/24 2329  WBC 10.9* 15.5*  --  17.2* 15.4* 16.2*  NEUTROABS 4.8 12.8*  --   --   --   --   HGB 12.1 9.6*   < > 9.1* 9.7* 10.1*  HCT 39.4 32.7*   < > 28.7* 30.5* 33.0*  MCV 89.3 94.5  --  86.2 85.4 87.5  PLT 359 340  --  342 268 258   < > = values in this interval not displayed.   Studies/Results: No results found.  Medications:    calcitRIOL   1.25 mcg Oral Q M,W,F   Chlorhexidine  Gluconate Cloth  6 each Topical Q0600    DULoxetine   20 mg Oral Daily   insulin  aspart  0-9 Units Subcutaneous Q4H   insulin  glargine  10 Units Subcutaneous Daily   lamoTRIgine   200 mg Oral Daily   pantoprazole  (PROTONIX ) IV  40 mg Intravenous Q12H   rosuvastatin   40 mg Oral Daily   sodium bicarbonate   650 mg Oral BID   sodium chloride  flush  3 mL Intravenous Q12H   Dialysis Orders MWF - [facility] ([address]) -> last dialyzed there on 01/07/24 4 hours, EDW 61kg, AVG  Assessment/Plan: DKA, T1DM: Last A1c 10.8%. Management per primary team. Off gtt this am.  Hematemesis: ?d/t vomiting, ongoing nausea. ESRD: Usual MWF schedule. Had HD Tues. HD again today on schedule.  HTN/volume: BP improved.  still seems volume up. Further  UF with next HD. 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 HFrEF (30-35%)   Maisie Ronnald Acosta PA-C [facility] 01/29/2024,9:40 AM

## 2024-01-29 NOTE — Progress Notes (Incomplete)
 Initial Nutrition Assessment  DOCUMENTATION CODES:   Not applicable  INTERVENTION:  Add Nepro Shake po BID, each supplement provides 425 kcal and 19 grams protein  Add renal MVI w/ minerals Discussed importance of protein intake as it relates to dialysis txs and volume status   NUTRITION DIAGNOSIS:  Increased nutrient needs related to chronic illness as evidenced by estimated needs.  GOAL:  Patient will meet greater than or equal to 90% of their needs  MONITOR:  PO intake, Supplement acceptance  REASON FOR ASSESSMENT:  Consult Assessment of nutrition requirement/status  ASSESSMENT:   Pt with PMH significant for: HTN, HLD, ESRD-HD, T1DM, DKA,depression, bipolar disorder, HFrEF (30-35%). Presents with hyperglycemia w/ elevated anion gap and hydroxybutyrate acid. Frequent admit (15 this year) for similar presentation along with fluid overload and N/V due to noncompliance with dialysis treatments. Admitted for DKA, type 1 and on insulin  drip.  BPs and fluid status improving with regular dialysis. Continues with bouts of hypoglycemia. Basal insulin  ordered and frequent sliding scale to manage.   Average Meal Intake  10/1: 20% x1 documented meal  Noted to be groggy and difficult to rouse at time of assessment. Willing to engage. Notes that she has no appetite currently due feeling full 2/2 fluid overload.  Also noted she had not been to OP dialysis in approximately 2 weeks, but nephrology documents since 9/9. She is amicable to ONS to improve nutrition status. She is also willing to continue to call down for meals, despite offering to have her order taken.   Discussed importance of attending dialysis txs as prescribed as fluid status impacting ability to consume enough calories and protein as well as decreasing albumin  trends. Discussed need for increased protein intake to push third space fluid back into the vascular system to allow for removal via dialysis. She verbalized  understanding, however likelihood of adherence questionable.  Admit Weight: 64.8 kg Current Weight: 55 kg EDW: 61kg Last HD tx: 9/30 Net UF: 3.9L +1 BLE edema  Abdomen soft, but somewhat distended. Ascites at baseline. Reports last paracetesis procedure performed outpatient had 5L yield. Significant weight loss this admission 2/2 fluid removal and therefore cannot be used as an indicator for malnutrition. Note she is under reported target weight. Will need adjustment upon discharge.   Drains/Lines: RUE: AVF: +t/b UOP: 1 unmeasured occurrence documented    Scheduled Meds:  calcitRIOL   1.25 mcg Oral Q M,W,F   insulin  aspart  0-5 Units Subcutaneous QHS   insulin  aspart  0-6 Units Subcutaneous TID WC   insulin  aspart  2 Units Subcutaneous TID WC   [START ON 01/31/2024] insulin  glargine  8 Units Subcutaneous Daily   pantoprazole   40 mg Oral BID   sodium bicarbonate   650 mg Oral BID   Labs: Na+ 135 (wdl) K+ 4.5 (wdl) PHOS 7.9 (H) Mg 2.5 (H) TSH 5.463 (11/17/2023) WBC 16.2>11.3>13.1 (H) CBGs 158-164 x24 hours A1c 10/8 (11/2023)  NUTRITION - FOCUSED PHYSICAL EXAM: More prominent fat and muscle depletions likely as she is fluid overloaded, which may be masking this. Would benefit from repeat NFPE when she is more euvolemic.  Flowsheet Row Most Recent Value  Orbital Region No depletion  Upper Arm Region No depletion  Thoracic and Lumbar Region Unable to assess  Buccal Region No depletion  Temple Region No depletion  Clavicle Bone Region Mild depletion  Clavicle and Acromion Bone Region Mild depletion  Scapular Bone Region No depletion  Dorsal Hand No depletion  Patellar Region No depletion  Anterior Thigh  Region No depletion  Posterior Calf Region No depletion  Edema (RD Assessment) Moderate  Hair Reviewed  Eyes Reviewed  Mouth Reviewed  Skin Reviewed  Nails Reviewed    Diet Order:   Diet Order             Diet renal/carb modified with fluid restriction Diet-HS  Snack? Nothing; Fluid restriction: 1200 mL Fluid; Room service appropriate? Yes; Fluid consistency: Thin  Diet effective now            EDUCATION NEEDS:   Education needs have been addressed  Skin:  Skin Assessment: Reviewed RN Assessment  Last BM:  10/1 - type 6 x1  Height:  Ht Readings from Last 1 Encounters:  01/27/24 5' 3 (1.6 m)   Weight:  Wt Readings from Last 1 Encounters:  01/30/24 55 kg   Ideal Body Weight:  52.3 kg  BMI:  Body mass index is 21.48 kg/m.  Estimated Nutritional Needs:   Kcal:  1600-1800 kcals  Protein:  85-100g  Fluid:  1L + UOP  Blair Deaner MS, RD, LDN Registered Dietitian Clinical Nutrition RD Inpatient Contact Info in Amion

## 2024-01-29 NOTE — Progress Notes (Signed)
 Anion gap 14 and beta-hydroxybutyric acid 0.10.  Patient CBG 75 and patient had received morning Lantus  already so lantus  held at providers approval.  Continue dextrose  fluids for additional 2 hours to ensure CBG improves.   0340-CBG now 99-dextrose  stopped.

## 2024-01-29 NOTE — Progress Notes (Signed)
 Pt's CBG 52, PRN Glucagon  administered.

## 2024-01-29 NOTE — Progress Notes (Signed)
 PROGRESS NOTE    Ashley Freeman  FMW:[id number] DOB: 19-Dec-1990 DOA: 01/26/2024 PCP: Keven Crumbly Pap, MD  Chief Complaint  Patient presents with   Weakness   Hematemesis    Brief Narrative:   33 year old female with history of HTN, HLD, ESRD on HD MWF, CHF with reduced EF, DM1, bipolar disorder, noncompliance with multiple admissions for DKA/fluid overload admitted for nausea and vomiting. Was recently in the ER on 9/26 for shortness of breath after missing dialysis. Symptoms improved after HD session therefore discharged home. Now noted to be hyperglycemic with elevated anion gap and beta hydroxybutyrate acid. Started on insulin  drip.   Assessment & Plan:   Principal Problem:   DKA, type 1 (HCC) Active Problems:   Lactic acidosis   SIRS (systemic inflammatory response syndrome) (HCC)   Hematemesis with nausea   Anemia due to chronic kidney disease   Prolonged QT interval   ESRD on dialysis (HCC)   Chronic combined systolic and diastolic heart failure (HCC)   Essential hypertension   Bipolar disorder (HCC)  DKA  T1DM  Back in DKA again this AM with AG and positive beta hydroxybutyrate - bicarb is normal  Tolerating PO this AM, received home basal insulin  yesterday morning - will continue basal insulin  and more frequent SSI to manage.   Trend BMP  SIRS Lactic Acidosis Suspected related to DKA CXR without acute cardiopulm process UA if able to collect Trend leukocytosis No si/sx infection  Headache Trial fioricet , described as migraine, monitor  Generalized Pain  Chest Pain Chest pain seems reproducible Follow EKG, troponin   Elevated LFT's Follow repeat HFP Follow RUQ US   Hematemesis  Nausea Suspected gastritis/mallory weiss PPI BID  Hemoccult pending Consider GI evaluation if recurrent or downtrending H/H  ESRD on HD Planning for HD today  Anemia of Chronic Disease Stable  HFrEF Bumex , coreg , entresto on hold with hypotension    Hypotension (hypertension) Holding antihypertensives  Prolonged Qtc Caution with qt prolonging meds Monitor and replace lytes  Bipolar Disorder Cymbalta , lamictal      DVT prophylaxis: SCD Code Status: full Family Communication: none Disposition:   Status is: Inpatient Remains inpatient appropriate because: need for continued inpatient care   Consultants:  none  Procedures:  none  Antimicrobials:  Anti-infectives (From admission, onward)    None       Subjective: C/o migraine HA - throbbing, 9/10 frontal ha Also pain all over - like Radiah Lubinski truck is on her - chest and extremities  Objective: Vitals:   01/28/24 1947 01/28/24 2310 01/29/24 0324 01/29/24 0800  BP: (!) 147/77 136/73 (!) 90/57 115/60  Pulse: 73 72 65 65  Resp: 19  17 16   Temp: 97.9 F (36.6 C) 97.6 F (36.4 C) (!) 97.5 F (36.4 C) 97.6 F (36.4 C)  TempSrc: Oral Oral Oral Oral  SpO2: 99% 99% 95% 96%  Weight:   60.9 kg   Height:        Intake/Output Summary (Last 24 hours) at 01/29/2024 0853 Last data filed at 01/29/2024 0400 Gross per 24 hour  Intake 745.94 ml  Output 3900 ml  Net -3154.06 ml   Filed Weights   01/28/24 0357 01/28/24 0920 01/29/24 0324  Weight: 64.5 kg 61.2 kg 60.9 kg    Examination:  General exam: Appears calm and comfortable  Respiratory system: unlabored Cardiovascular system: RRR - reproducible chest pain Gastrointestinal system: Abdomen is nondistended, soft and nontender. Central nervous system: Alert and oriented. No focal neurological deficits. Extremities: no LEE  Data Reviewed: I have personally reviewed following labs and imaging studies  CBC: Recent Labs  Lab 01/24/24 2356 01/26/24 1218 01/26/24 1829 01/27/24 0236 01/28/24 0520 01/28/24 2329  WBC 10.9* 15.5*  --  17.2* 15.4* 16.2*  NEUTROABS 4.8 12.8*  --   --   --   --   HGB 12.1 9.6* 8.7* 9.1* 9.7* 10.1*  HCT 39.4 32.7* 30.8* 28.7* 30.5* 33.0*  MCV 89.3 94.5  --  86.2 85.4 87.5  PLT  359 340  --  342 268 258    Basic Metabolic Panel: Recent Labs  Lab 01/26/24 1218 01/26/24 1829 01/28/24 0520 01/28/24 1120 01/28/24 1911 01/28/24 2329 01/29/24 0746  NA 136   < > 139 137 137 136 136  K 5.1   < > 3.7 3.8 3.8 4.1 4.2  CL 92*   < > 99 97* 97* 96* 91*  CO2 <7*   < > 25 19* 24 26 27   GLUCOSE 589*   < > 97 290* 154* 101* 142*  BUN 54*   < > 61* 29* 32* 33* 34*  CREATININE 8.22*   < > 8.20* 4.98* 5.60* 5.64* 5.78*  CALCIUM  8.9   < > 8.2* 8.0* 8.4* 8.2* 8.6*  MG  --   --  2.7*  --   --  2.2  --   PHOS 9.6*  --  7.9*  --   --   --   --    < > = values in this interval not displayed.    GFR: Estimated Creatinine Clearance: 11.7 mL/min (Kito Cuffe) (by C-G formula based on SCr of 5.78 mg/dL (H)).  Liver Function Tests: Recent Labs  Lab 01/26/24 1218  AST 18  ALT 34  ALKPHOS 558*  BILITOT 3.1*  PROT 6.2*  ALBUMIN  3.4*    CBG: Recent Labs  Lab 01/29/24 0119 01/29/24 0218 01/29/24 0323 01/29/24 0555 01/29/24 0831  GLUCAP 75 74 99 119* 151*     Recent Results (from the past 240 hours)  Resp panel by RT-PCR (RSV, Flu Theoplis Garciagarcia&B, Covid) Anterior Nasal Swab     Status: None   Collection Time: 01/24/24 11:56 PM   Specimen: Anterior Nasal Swab  Result Value Ref Range Status   SARS Coronavirus 2 by RT PCR NEGATIVE NEGATIVE Final   Influenza Ke[place]on Garden by PCR NEGATIVE NEGATIVE Final   Influenza B by PCR NEGATIVE NEGATIVE Final    Comment: (NOTE) The Xpert Xpress SARS-CoV-2/FLU/RSV plus assay is intended as an aid in the diagnosis of influenza from Nasopharyngeal swab specimens and should not be used as Drake Wuertz sole basis for treatment. Nasal washings and aspirates are unacceptable for Xpert Xpress SARS-CoV-2/FLU/RSV testing.  Fact Sheet for Patients: BloggerCourse.com  Fact Sheet for Healthcare Providers: SeriousBroker.it  This test is not yet approved or cleared by the [place]  FDA and has been authorized for detection  and/or diagnosis of SARS-CoV-2 by FDA under an Emergency Use Authorization (EUA). This EUA will remain in effect (meaning this test can be used) for the duration of the COVID-19 declaration under Section 564(b)(1) of the Act, 21 U.S.C. section 360bbb-3(b)(1), unless the authorization is terminated or revoked.     Resp Syncytial Virus by PCR NEGATIVE NEGATIVE Final    Comment: (NOTE) Fact Sheet for Patients: BloggerCourse.com  Fact Sheet for Healthcare Providers: SeriousBroker.it  This test is not yet approved or cleared by the [place]  FDA and has been authorized for detection and/or diagnosis of SARS-CoV-2 by FDA under an Emergency Use Authorization (  EUA). This EUA will remain in effect (meaning this test can be used) for the duration of the COVID-19 declaration under Section 564(b)(1) of the Act, 21 U.S.C. section 360bbb-3(b)(1), unless the authorization is terminated or revoked.  Performed at [facility], [address] [place], [place] [address]   Resp panel by RT-PCR (RSV, Flu Ignatius Kloos&B, Covid) Anterior Nasal Swab     Status: None   Collection Time: 01/26/24 12:24 PM   Specimen: Anterior Nasal Swab  Result Value Ref Range Status   SARS Coronavirus 2 by RT PCR NEGATIVE NEGATIVE Final   Influenza Keiasha Diep by PCR NEGATIVE NEGATIVE Final   Influenza B by PCR NEGATIVE NEGATIVE Final    Comment: (NOTE) The Xpert Xpress SARS-CoV-2/FLU/RSV plus assay is intended as an aid in the diagnosis of influenza from Nasopharyngeal swab specimens and should not be used as Carrington Mullenax sole basis for treatment. Nasal washings and aspirates are unacceptable for Xpert Xpress SARS-CoV-2/FLU/RSV testing.  Fact Sheet for Patients: BloggerCourse.com  Fact Sheet for Healthcare Providers: SeriousBroker.it  This test is not yet approved or cleared by the [place]  FDA and has been authorized for  detection and/or diagnosis of SARS-CoV-2 by FDA under an Emergency Use Authorization (EUA). This EUA will remain in effect (meaning this test can be used) for the duration of the COVID-19 declaration under Section 564(b)(1) of the Act, 21 U.S.C. section 360bbb-3(b)(1), unless the authorization is terminated or revoked.     Resp Syncytial Virus by PCR NEGATIVE NEGATIVE Final    Comment: (NOTE) Fact Sheet for Patients: BloggerCourse.com  Fact Sheet for Healthcare Providers: SeriousBroker.it  This test is not yet approved or cleared by the [place]  FDA and has been authorized for detection and/or diagnosis of SARS-CoV-2 by FDA under an Emergency Use Authorization (EUA). This EUA will remain in effect (meaning this test can be used) for the duration of the COVID-19 declaration under Section 564(b)(1) of the Act, 21 U.S.C. section 360bbb-3(b)(1), unless the authorization is terminated or revoked.  Performed at [facility], [address], [place], [place] [address]   Blood culture (routine x 2)     Status: None (Preliminary result)   Collection Time: 01/26/24 12:35 PM   Specimen: BLOOD LEFT ARM  Result Value Ref Range Status   Specimen Description BLOOD LEFT ARM  Final   Special Requests   Final    BOTTLES DRAWN AEROBIC AND ANAEROBIC Blood Culture results may not be optimal due to an inadequate volume of blood received in culture bottles   Culture   Final    NO GROWTH 3 DAYS Performed at [facility], [address], [place], [place] [address]    Report Status PENDING  Incomplete  Blood culture (routine x 2)     Status: None (Preliminary result)   Collection Time: 01/26/24  6:29 PM   Specimen: BLOOD LEFT HAND  Result Value Ref Range Status   Specimen Description BLOOD LEFT HAND  Final   Special Requests   Final    BOTTLES DRAWN AEROBIC AND ANAEROBIC Blood Culture adequate volume   Culture   Final    NO GROWTH  3 DAYS Performed at [facility], [address] [place], [place] [address]    Report Status PENDING  Incomplete  MRSA Next Gen by PCR, Nasal     Status: None   Collection Time: 01/26/24  7:26 PM   Specimen: Nasal Mucosa; Nasal Swab  Result Value Ref Range Status  MRSA by PCR Next Gen NOT DETECTED NOT DETECTED Final    Comment: (NOTE) The GeneXpert MRSA Assay (FDA approved for NASAL specimens only), is one component of Riad Wagley comprehensive MRSA colonization surveillance program. It is not intended to diagnose MRSA infection nor to guide or monitor treatment for MRSA infections. Test performance is not FDA approved in patients less than 37 years old. Performed at [facility], [address] [place], [place] [address]          Radiology Studies: No results found.      Scheduled Meds:  calcitRIOL   1.25 mcg Oral Q M,W,F   Chlorhexidine  Gluconate Cloth  6 each Topical Q0600   DULoxetine   20 mg Oral Daily   insulin  aspart  0-6 Units Subcutaneous Q4H   insulin  glargine  10 Units Subcutaneous QHS   lamoTRIgine   200 mg Oral Daily   pantoprazole  (PROTONIX ) IV  40 mg Intravenous Q12H   rosuvastatin   40 mg Oral Daily   sodium bicarbonate   650 mg Oral BID   sodium chloride  flush  3 mL Intravenous Q12H   Continuous Infusions:   LOS: 2 days    Time spent: over 30 min     Meliton Monte, MD [facility]   To contact the attending provider between 7A-7P or the covering provider during after hours 7P-7A, please log into the web site [email or web address] and access using universal [place] password for that web site. If you do not have the password, please call the hospital operator.  01/29/2024, 8:53 AM

## 2024-01-29 NOTE — Plan of Care (Signed)
  Problem: Education: Goal: Knowledge of General Education information will improve Description: Including pain rating scale, medication(s)/side effects and non-pharmacologic comfort measures Outcome: Progressing   Problem: Clinical Measurements: Goal: Diagnostic test results will improve Outcome: Progressing Goal: Respiratory complications will improve Outcome: Progressing Goal: Cardiovascular complication will be avoided Outcome: Progressing   Problem: Activity: Goal: Risk for activity intolerance will decrease Outcome: Progressing   Problem: Coping: Goal: Level of anxiety will decrease Outcome: Progressing   Problem: Pain Managment: Goal: General experience of comfort will improve and/or be controlled Outcome: Progressing   Problem: Safety: Goal: Ability to remain free from injury will improve Outcome: Progressing

## 2024-01-30 ENCOUNTER — Inpatient Hospital Stay (HOSPITAL_COMMUNITY)

## 2024-01-30 DIAGNOSIS — E1010 Type 1 diabetes mellitus with ketoacidosis without coma: Secondary | ICD-10-CM | POA: Diagnosis not present

## 2024-01-30 LAB — GLUCOSE, CAPILLARY
Glucose-Capillary: 154 mg/dL — ABNORMAL HIGH (ref 70–99)
Glucose-Capillary: 60 mg/dL — ABNORMAL LOW (ref 70–99)
Glucose-Capillary: 63 mg/dL — ABNORMAL LOW (ref 70–99)
Glucose-Capillary: 70 mg/dL (ref 70–99)
Glucose-Capillary: 46 mg/dL — ABNORMAL LOW (ref 70–99)
Glucose-Capillary: 47 mg/dL — ABNORMAL LOW (ref 70–99)
Glucose-Capillary: 164 mg/dL — ABNORMAL HIGH (ref 70–99)
Glucose-Capillary: 164 mg/dL — ABNORMAL HIGH (ref 70–99)
Glucose-Capillary: 147 mg/dL — ABNORMAL HIGH (ref 70–99)

## 2024-01-30 LAB — BASIC METABOLIC PANEL WITH GFR
Anion gap: 12 (ref 5–15)
BUN: 41 mg/dL — ABNORMAL HIGH (ref 6–20)
CO2: 25 mmol/L (ref 22–32)
Calcium: 8.1 mg/dL — ABNORMAL LOW (ref 8.9–10.3)
Chloride: 98 mmol/L (ref 98–111)
Creatinine, Ser: 6.61 mg/dL — ABNORMAL HIGH (ref 0.44–1.00)
GFR, Estimated: 8 mL/min — ABNORMAL LOW (ref >60)
Glucose, Bld: 158 mg/dL — ABNORMAL HIGH (ref 70–99)
Potassium: 4.5 mmol/L (ref 3.5–5.1)
Sodium: 135 mmol/L (ref 135–145)

## 2024-01-30 LAB — CBC
HCT: 32.6 % — ABNORMAL LOW (ref 36.0–46.0)
Hemoglobin: 9.9 g/dL — ABNORMAL LOW (ref 12.0–15.0)
MCH: 27.2 pg (ref 26.0–34.0)
MCHC: 30.4 g/dL (ref 30.0–36.0)
MCV: 89.6 fL (ref 80.0–100.0)
Platelets: 214 K/uL (ref 150–400)
RBC: 3.64 MIL/uL — ABNORMAL LOW (ref 3.87–5.11)
RDW: 15.3 % (ref 11.5–15.5)
WBC: 13.1 K/uL — ABNORMAL HIGH (ref 4.0–10.5)
nRBC: 0.2 % (ref 0.0–0.2)

## 2024-01-30 LAB — MAGNESIUM: Magnesium: 2.5 mg/dL — ABNORMAL HIGH (ref 1.7–2.4)

## 2024-01-30 LAB — HEPATIC FUNCTION PANEL
ALT: 24 U/L (ref 0–44)
AST: 50 U/L — ABNORMAL HIGH (ref 15–41)
Albumin: 2.2 g/dL — ABNORMAL LOW (ref 3.5–5.0)
Alkaline Phosphatase: 469 U/L — ABNORMAL HIGH (ref 38–126)
Bilirubin, Direct: 0.1 mg/dL (ref 0.0–0.2)
Indirect Bilirubin: 0.5 mg/dL (ref 0.3–0.9)
Total Bilirubin: 0.6 mg/dL (ref 0.0–1.2)
Total Protein: 4.5 g/dL — ABNORMAL LOW (ref 6.5–8.1)

## 2024-01-30 MED ORDER — LIDOCAINE HCL (PF) 1 % IJ SOLN: 5.0000 mL | INTRAMUSCULAR | Status: DC | PRN

## 2024-01-30 MED ORDER — PENTAFLUOROPROP-TETRAFLUOROETH EX AERO: 1.0000 | INHALATION_SPRAY | CUTANEOUS | Status: DC | PRN

## 2024-01-30 MED ORDER — PANTOPRAZOLE SODIUM 40 MG PO TBEC
40.0000 mg | DELAYED_RELEASE_TABLET | Freq: Two times a day (BID) | ORAL | Status: DC
Start: 2024-01-30 — End: 2024-02-01
  Administered 2024-01-30 – 2024-01-31 (×2): 40 mg via ORAL
  Filled 2024-01-30 (×2): qty 1

## 2024-01-30 MED ORDER — ALTEPLASE 2 MG IJ SOLR: 2.0000 mg | Freq: Once | INTRAMUSCULAR | Status: DC | PRN

## 2024-01-30 MED ORDER — NEPRO/CARBSTEADY PO LIQD: 237.0000 mL | ORAL | Status: DC | PRN

## 2024-01-30 MED ORDER — ALTEPLASE 2 MG IJ SOLR
2.0000 mg | Freq: Once | INTRAMUSCULAR | Status: DC | PRN
Start: 2024-01-30 — End: 2024-01-30

## 2024-01-30 MED ORDER — LIDOCAINE-PRILOCAINE 2.5-2.5 % EX CREA: 1.0000 | TOPICAL_CREAM | CUTANEOUS | Status: DC | PRN

## 2024-01-30 MED ORDER — INSULIN GLARGINE 100 UNIT/ML ~~LOC~~ SOLN
8.0000 [IU] | Freq: Every day | SUBCUTANEOUS | Status: DC
  Administered 2024-01-31: 8 [IU] via SUBCUTANEOUS
  Filled 2024-01-30: qty 0.08

## 2024-01-30 MED ORDER — HEPARIN SODIUM (PORCINE) 1000 UNIT/ML DIALYSIS: 1000.0000 [IU] | INTRAMUSCULAR | Status: DC | PRN

## 2024-01-30 MED ORDER — INSULIN ASPART 100 UNIT/ML IJ SOLN: 0.0000 [IU] | Freq: Every day | INTRAMUSCULAR | Status: DC

## 2024-01-30 MED ORDER — INSULIN ASPART 100 UNIT/ML IJ SOLN
0.0000 [IU] | Freq: Three times a day (TID) | INTRAMUSCULAR | Status: DC
  Administered 2024-01-30: 1 [IU] via SUBCUTANEOUS
  Administered 2024-01-31 (×2): 3 [IU] via SUBCUTANEOUS

## 2024-01-30 MED ORDER — RENA-VITE PO TABS
1.0000 | ORAL_TABLET | Freq: Every day | ORAL | Status: DC
  Administered 2024-01-30: 1 via ORAL
  Filled 2024-01-30: qty 1

## 2024-01-30 MED ORDER — ANTICOAGULANT SODIUM CITRATE 4% (200MG/5ML) IV SOLN: 5.0000 mL | Status: DC | PRN

## 2024-01-30 MED ORDER — METOCLOPRAMIDE HCL 5 MG PO TABS
5.0000 mg | ORAL_TABLET | Freq: Three times a day (TID) | ORAL | Status: DC
  Administered 2024-01-30 – 2024-01-31 (×4): 5 mg via ORAL
  Filled 2024-01-30 (×4): qty 1

## 2024-01-30 MED ORDER — INSULIN ASPART 100 UNIT/ML IJ SOLN
2.0000 [IU] | Freq: Three times a day (TID) | INTRAMUSCULAR | Status: DC
Start: 2024-01-30 — End: 2024-01-30

## 2024-01-30 MED ORDER — NEPRO/CARBSTEADY PO LIQD
237.0000 mL | Freq: Two times a day (BID) | ORAL | Status: DC
  Administered 2024-01-31: 237 mL via ORAL

## 2024-01-30 MED ORDER — ANTICOAGULANT SODIUM CITRATE 4% (200MG/5ML) IV SOLN
5.0000 mL | Status: DC | PRN
Start: 2024-01-30 — End: 2024-01-30

## 2024-01-30 MED ORDER — SUCRALFATE 1 GM/10ML PO SUSP
1.0000 g | Freq: Three times a day (TID) | ORAL | Status: DC
  Administered 2024-01-30 – 2024-01-31 (×3): 1 g via ORAL
  Filled 2024-01-30 (×3): qty 10

## 2024-01-30 MED ORDER — LIDOCAINE-PRILOCAINE 2.5-2.5 % EX CREA
1.0000 | TOPICAL_CREAM | CUTANEOUS | Status: DC | PRN
Start: 2024-01-30 — End: 2024-01-30

## 2024-01-30 MED ORDER — ALUM & MAG HYDROXIDE-SIMETH 200-200-20 MG/5ML PO SUSP
30.0000 mL | Freq: Once | ORAL | Status: AC
  Administered 2024-01-30: 30 mL via ORAL
  Filled 2024-01-30: qty 30

## 2024-01-30 MED ORDER — GLUCOSE 4 G PO CHEW
CHEWABLE_TABLET | ORAL | Status: AC
  Filled 2024-01-30: qty 1

## 2024-01-30 MED ORDER — DEXTROSE 50 % IV SOLN: 25.0000 g | INTRAVENOUS | Status: AC

## 2024-01-30 NOTE — Progress Notes (Signed)
 [facility] Progress Note   Subjective:    Seen in [facility]. On dialysis This am with hypoglycemia Drowsy but no complaints   Objective Vitals:   01/30/24 0930 01/30/24 0951 01/30/24 0955 01/30/24 1042  BP: (!) 118/103 (!) 161/81 (!) 167/59   Pulse: 69 69 69   Resp: 16 17 12    Temp:   98.3 F (36.8 C)   TempSrc:      SpO2: 100% 99% 100%   Weight:   57.5 kg 55 kg  Height:       Physical Exam General: Ill appearing, NAD.  Heart: RRR Lungs: CTAB Abdomen: soft, but distended Extremities: 1+ BLE edema Dialysis Access: AVF +t/b  Additional Objective Labs: Basic Metabolic Panel: Recent Labs  Lab 01/26/24 1218 01/26/24 1829 01/28/24 0520 01/28/24 1120 01/29/24 0746 01/29/24 1302 01/30/24 0258  NA 136   < > 139   < > 136 134* 135  K 5.1   < > 3.7   < > 4.2 4.7 4.5  CL 92*   < > 99   < > 91* 95* 98  CO2 <7*   < > 25   < > 27 21* 25  GLUCOSE 589*   < > 97   < > 142* 164* 158*  BUN 54*   < > 61*   < > 34* 35* 41*  CREATININE 8.22*   < > 8.20*   < > 5.78* 6.05* 6.61*  CALCIUM  8.9   < > 8.2*   < > 8.6* 8.1* 8.1*  PHOS 9.6*  --  7.9*  --   --   --   --    < > = values in this interval not displayed.   Liver Function Tests: Recent Labs  Lab 01/26/24 1218 01/29/24 1302 01/30/24 0258  AST 18 35 50*  ALT 34 20 24  ALKPHOS 558* 493* 469*  BILITOT 3.1* 1.0 0.6  PROT 6.2* 4.6* 4.5*  ALBUMIN  3.4* 2.4* 2.2*   CBC: Recent Labs  Lab 01/24/24 2356 01/26/24 1218 01/26/24 1829 01/27/24 0236 01/28/24 0520 01/28/24 2329 01/29/24 1302 01/30/24 0258  WBC 10.9* 15.5*  --  17.2* 15.4* 16.2* 11.3* 13.1*  NEUTROABS 4.8 12.8*  --   --   --   --  7.4  --   HGB 12.1 9.6*   < > 9.1* 9.7* 10.1* 10.3* 9.9*  HCT 39.4 32.7*   < > 28.7* 30.5* 33.0* 34.0* 32.6*  MCV 89.3 94.5  --  86.2 85.4 87.5 90.4 89.6  PLT 359 340  --  342 268 258 180 214   < > = values in this interval not displayed.   Studies/Results: No results found.  Medications:    calcitRIOL   1.25 mcg  Oral Q M,W,F   Chlorhexidine  Gluconate Cloth  6 each Topical Q0600   DULoxetine   20 mg Oral Daily   insulin  aspart  0-5 Units Subcutaneous QHS   insulin  aspart  0-6 Units Subcutaneous TID WC   insulin  aspart  2 Units Subcutaneous TID WC   insulin  glargine  10 Units Subcutaneous Daily   lamoTRIgine   200 mg Oral Daily   lidocaine   1 patch Transdermal Q24H   pantoprazole  (PROTONIX ) IV  40 mg Intravenous Q12H   rosuvastatin   40 mg Oral Daily   sodium bicarbonate   650 mg Oral BID   sodium chloride  flush  3 mL Intravenous Q12H   Dialysis Orders MWF - [facility] ([address]) -> last dialyzed there on 01/07/24 4  hours, EDW 61kg, AVG  Assessment/Plan: DKA, T1DM: Last A1c 10.8%. Management per primary team. Hematemesis: ?d/t vomiting, ongoing nausea. ESRD: Usual MWF schedule. Had HD Tues. HD off schedule today  HTN/volume: BP improved.  Volume improved. Well below outpatient dry weight.  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 HFrEF (30-35%)   Maisie Ronnald Acosta PA-C [facility] 01/30/2024,10:56 AM

## 2024-01-30 NOTE — Plan of Care (Signed)
  Problem: Education: Goal: Knowledge of General Education information will improve Description: Including pain rating scale, medication(s)/side effects and non-pharmacologic comfort measures Outcome: Progressing   Problem: Health Behavior/Discharge Planning: Goal: Ability to manage health-related needs will improve Outcome: Progressing   Problem: Nutrition: Goal: Adequate nutrition will be maintained Outcome: Progressing   Problem: Pain Managment: Goal: General experience of comfort will improve and/or be controlled Outcome: Progressing   Problem: Coping: Goal: Ability to adjust to condition or change in health will improve Outcome: Progressing   Problem: Metabolic: Goal: Ability to maintain appropriate glucose levels will improve Outcome: Progressing   Problem: Nutritional: Goal: Maintenance of adequate nutrition will improve Outcome: Progressing

## 2024-01-30 NOTE — Progress Notes (Addendum)
 PROGRESS NOTE    Ashley Freeman  FMW:[id number] DOB: [birth year] DOA: 01/26/2024 PCP: Keven Crumbly Pap, MD  Chief Complaint  Patient presents with   Weakness   Hematemesis    Brief Narrative:   33 year old female with history of HTN, HLD, ESRD on HD MWF, CHF with reduced EF, DM1, bipolar disorder, noncompliance with multiple admissions for DKA/fluid overload admitted for nausea and vomiting. Was recently in the ER on 9/26 for shortness of breath after missing dialysis. Symptoms improved after HD session therefore discharged home. Now noted to be hyperglycemic with elevated anion gap and beta hydroxybutyrate acid. Started on insulin  drip.   Assessment & Plan:   Principal Problem:   DKA, type 1 (HCC) Active Problems:   Lactic acidosis   SIRS (systemic inflammatory response syndrome) (HCC)   Hematemesis with nausea   Anemia due to chronic kidney disease   Prolonged QT interval   ESRD on dialysis (HCC)   Chronic combined systolic and diastolic heart failure (HCC)   Essential hypertension   Bipolar disorder (HCC)  DKA  T1DM  Hypoglycemia AG now resolved Continue basal, SSI   Trend BMP  Generalized Pain  Chest Pain Chest pain is reproducible - she notes this is worse after eating - strange description with reproducibility with palpation of chest + worse with PO intake.  Consider musculoskeletal CP, diabetic gastroparesis (description not classic), esophagitis, etc. Troponin negative and flat Trial sucralfate , trial reglan  - will see how she does Continue PPI   Elevated LFT's Now resolved - persistent, chronically elevated alk phos, but bilirubin/ast/alt have normalized Will hold off on RUQ US  with resolution of acute abnormalities and her hx cholecystectomy  SIRS Lactic Acidosis Suspected related to DKA CXR without acute cardiopulm process UA if able to collect Trend leukocytosis No si/sx infection  Headache Trial fioricet , described as migraine,  monitor  Hematemesis  Nausea Suspected gastritis/mallory weiss PPI BID  Hemoccult pending Consider GI evaluation if recurrent or downtrending H/H  ESRD on HD Dialysis per renal   Anemia of Chronic Disease Stable  HFrEF Bumex , coreg , entresto on hold with hypotension   Hypotension (hypertension) Holding antihypertensives  Prolonged Qtc Caution with qt prolonging meds Monitor and replace lytes  Bipolar Disorder Cymbalta , lamictal      DVT prophylaxis: SCD Code Status: full Family Communication: none Disposition:   Status is: Inpatient Remains inpatient appropriate because: need for continued inpatient care   Consultants:  none  Procedures:  none  Antimicrobials:  Anti-infectives (From admission, onward)    None       Subjective: C/o migraine HA - throbbing, 9/10 frontal ha Also pain all over - like Loda Bialas truck is on her - chest and extremities  Objective: Vitals:   01/30/24 0955 01/30/24 1042 01/30/24 1053 01/30/24 1111  BP: (!) 167/59  (!) 151/63 (!) 151/63  Pulse: 69  70 70  Resp: 12  16 17   Temp: 98.3 F (36.8 C)  97.8 F (36.6 C) 97.9 F (36.6 C)  TempSrc:   Oral Oral  SpO2: 100%     Weight: 57.5 kg 55 kg    Height:        Intake/Output Summary (Last 24 hours) at 01/30/2024 1548 Last data filed at 01/30/2024 1000 Gross per 24 hour  Intake --  Output 5000 ml  Net -5000 ml   Filed Weights   01/30/24 0430 01/30/24 0955 01/30/24 1042  Weight: 59.9 kg 57.5 kg 55 kg    Examination:  General: No acute distress. Cardiovascular: reproducible  CP with palpation - RRR Lungs: unlabored Abdomen: Soft, nontender, nondistended Neurological: Alert and oriented 3. Moves all extremities 4 with equal strength. Cranial nerves II through XII grossly  Extremities: No clubbing or cyanosis. No edema.   Data Reviewed: I have personally reviewed following labs and imaging studies  CBC: Recent Labs  Lab 01/24/24 2356 01/26/24 1218 01/26/24 1829  01/27/24 0236 01/28/24 0520 01/28/24 2329 01/29/24 1302 01/30/24 0258  WBC 10.9* 15.5*  --  17.2* 15.4* 16.2* 11.3* 13.1*  NEUTROABS 4.8 12.8*  --   --   --   --  7.4  --   HGB 12.1 9.6*   < > 9.1* 9.7* 10.1* 10.3* 9.9*  HCT 39.4 32.7*   < > 28.7* 30.5* 33.0* 34.0* 32.6*  MCV 89.3 94.5  --  86.2 85.4 87.5 90.4 89.6  PLT 359 340  --  342 268 258 180 214   < > = values in this interval not displayed.    Basic Metabolic Panel: Recent Labs  Lab 01/26/24 1218 01/26/24 1829 01/28/24 0520 01/28/24 1120 01/28/24 1911 01/28/24 2329 01/29/24 0746 01/29/24 1302 01/30/24 0258  NA 136   < > 139   < > 137 136 136 134* 135  K 5.1   < > 3.7   < > 3.8 4.1 4.2 4.7 4.5  CL 92*   < > 99   < > 97* 96* 91* 95* 98  CO2 <7*   < > 25   < > 24 26 27  21* 25  GLUCOSE 589*   < > 97   < > 154* 101* 142* 164* 158*  BUN 54*   < > 61*   < > 32* 33* 34* 35* 41*  CREATININE 8.22*   < > 8.20*   < > 5.60* 5.64* 5.78* 6.05* 6.61*  CALCIUM  8.9   < > 8.2*   < > 8.4* 8.2* 8.6* 8.1* 8.1*  MG  --   --  2.7*  --   --  2.2  --   --  2.5*  PHOS 9.6*  --  7.9*  --   --   --   --   --   --    < > = values in this interval not displayed.    GFR: Estimated Creatinine Clearance: 10.2 mL/min (Marena Witts) (by C-G formula based on SCr of 6.61 mg/dL (H)).  Liver Function Tests: Recent Labs  Lab 01/26/24 1218 01/29/24 1302 01/30/24 0258  AST 18 35 50*  ALT 34 20 24  ALKPHOS 558* 493* 469*  BILITOT 3.1* 1.0 0.6  PROT 6.2* 4.6* 4.5*  ALBUMIN  3.4* 2.4* 2.2*    CBG: Recent Labs  Lab 01/30/24 0902 01/30/24 0935 01/30/24 1130 01/30/24 1212 01/30/24 1239  GLUCAP 63* 70 46* 47* 164*     Recent Results (from the past 240 hours)  Resp panel by RT-PCR (RSV, Flu Kuzey Ogata&B, Covid) Anterior Nasal Swab     Status: None   Collection Time: 01/24/24 11:56 PM   Specimen: Anterior Nasal Swab  Result Value Ref Range Status   SARS Coronavirus 2 by RT PCR NEGATIVE NEGATIVE Final   Influenza Shaw Dobek by PCR NEGATIVE NEGATIVE Final    Influenza B by PCR NEGATIVE NEGATIVE Final    Comment: (NOTE) The Xpert Xpress SARS-CoV-2/FLU/RSV plus assay is intended as an aid in the diagnosis of influenza from Nasopharyngeal swab specimens and should not be used as Willaim Mode sole basis for treatment. Nasal washings and aspirates are unacceptable for Xpert  Xpress SARS-CoV-2/FLU/RSV testing.  Fact Sheet for Patients: BloggerCourse.com  Fact Sheet for Healthcare Providers: SeriousBroker.it  This test is not yet approved or cleared by the [place]  FDA and has been authorized for detection and/or diagnosis of SARS-CoV-2 by FDA under an Emergency Use Authorization (EUA). This EUA will remain in effect (meaning this test can be used) for the duration of the COVID-19 declaration under Section 564(b)(1) of the Act, 21 U.S.C. section 360bbb-3(b)(1), unless the authorization is terminated or revoked.     Resp Syncytial Virus by PCR NEGATIVE NEGATIVE Final    Comment: (NOTE) Fact Sheet for Patients: BloggerCourse.com  Fact Sheet for Healthcare Providers: SeriousBroker.it  This test is not yet approved or cleared by the [place]  FDA and has been authorized for detection and/or diagnosis of SARS-CoV-2 by FDA under an Emergency Use Authorization (EUA). This EUA will remain in effect (meaning this test can be used) for the duration of the COVID-19 declaration under Section 564(b)(1) of the Act, 21 U.S.C. section 360bbb-3(b)(1), unless the authorization is terminated or revoked.  Performed at [facility], [address], [place], [place] [address]   Resp panel by RT-PCR (RSV, Flu Matisse Roskelley&B, Covid) Anterior Nasal Swab     Status: None   Collection Time: 01/26/24 12:24 PM   Specimen: Anterior Nasal Swab  Result Value Ref Range Status   SARS Coronavirus 2 by RT PCR NEGATIVE NEGATIVE Final   Influenza Darus Hershman by PCR NEGATIVE NEGATIVE Final    Influenza B by PCR NEGATIVE NEGATIVE Final    Comment: (NOTE) The Xpert Xpress SARS-CoV-2/FLU/RSV plus assay is intended as an aid in the diagnosis of influenza from Nasopharyngeal swab specimens and should not be used as Lenah Messenger sole basis for treatment. Nasal washings and aspirates are unacceptable for Xpert Xpress SARS-CoV-2/FLU/RSV testing.  Fact Sheet for Patients: BloggerCourse.com  Fact Sheet for Healthcare Providers: SeriousBroker.it  This test is not yet approved or cleared by the [place]  FDA and has been authorized for detection and/or diagnosis of SARS-CoV-2 by FDA under an Emergency Use Authorization (EUA). This EUA will remain in effect (meaning this test can be used) for the duration of the COVID-19 declaration under Section 564(b)(1) of the Act, 21 U.S.C. section 360bbb-3(b)(1), unless the authorization is terminated or revoked.     Resp Syncytial Virus by PCR NEGATIVE NEGATIVE Final    Comment: (NOTE) Fact Sheet for Patients: BloggerCourse.com  Fact Sheet for Healthcare Providers: SeriousBroker.it  This test is not yet approved or cleared by the [place]  FDA and has been authorized for detection and/or diagnosis of SARS-CoV-2 by FDA under an Emergency Use Authorization (EUA). This EUA will remain in effect (meaning this test can be used) for the duration of the COVID-19 declaration under Section 564(b)(1) of the Act, 21 U.S.C. section 360bbb-3(b)(1), unless the authorization is terminated or revoked.  Performed at [facility], [address] [place], [place] [address]   Blood culture (routine x 2)     Status: None (Preliminary result)   Collection Time: 01/26/24 12:35 PM   Specimen: BLOOD LEFT ARM  Result Value Ref Range Status   Specimen Description BLOOD LEFT ARM  Final   Special Requests   Final    BOTTLES DRAWN AEROBIC AND ANAEROBIC  Blood Culture results may not be optimal due to an inadequate volume of blood received in culture bottles   Culture   Final    NO GROWTH 4 DAYS Performed at [facility], [address], [place], [place] [address]    Report Status PENDING  Incomplete  Blood culture (routine x 2)     Status: None (Preliminary result)   Collection Time: 01/26/24  6:29 PM   Specimen: BLOOD LEFT HAND  Result Value Ref Range Status   Specimen Description BLOOD LEFT HAND  Final   Special Requests   Final    BOTTLES DRAWN AEROBIC AND ANAEROBIC Blood Culture adequate volume   Culture   Final    NO GROWTH 4 DAYS Performed at [facility], [address], [place], [place] [address]    Report Status PENDING  Incomplete  MRSA Next Gen by PCR, Nasal     Status: None   Collection Time: 01/26/24  7:26 PM   Specimen: Nasal Mucosa; Nasal Swab  Result Value Ref Range Status   MRSA by PCR Next Gen NOT DETECTED NOT DETECTED Final    Comment: (NOTE) The GeneXpert MRSA Assay (FDA approved for NASAL specimens only), is one component of Daray Polgar comprehensive MRSA colonization surveillance program. It is not intended to diagnose MRSA infection nor to guide or monitor treatment for MRSA infections. Test performance is not FDA approved in patients less than 66 years old. Performed at [facility], [address] [place], [place] [address]          Radiology Studies: No results found.      Scheduled Meds:  calcitRIOL   1.25 mcg Oral Q M,W,F   Chlorhexidine  Gluconate Cloth  6 each Topical Q0600   DULoxetine   20 mg Oral Daily   feeding supplement (NEPRO CARB STEADY)  237 mL Oral BID BM   insulin  aspart  0-5 Units Subcutaneous QHS   insulin  aspart  0-6 Units Subcutaneous TID WC   insulin  aspart  2 Units Subcutaneous TID WC   [START ON 01/31/2024] insulin  glargine  8 Units Subcutaneous Daily   lamoTRIgine   200 mg Oral Daily   lidocaine   1 patch Transdermal Q24H   metoCLOPramide   5 mg Oral TID AC &  HS   multivitamin  1 tablet Oral QHS   pantoprazole   40 mg Oral BID   rosuvastatin   40 mg Oral Daily   sodium bicarbonate   650 mg Oral BID   sodium chloride  flush  3 mL Intravenous Q12H   Continuous Infusions:   LOS: 3 days    Time spent: over 30 min     Meliton Monte, MD [facility]   To contact the attending provider between 7A-7P or the covering provider during after hours 7P-7A, please log into the web site [email or web address] and access using universal [place] password for that web site. If you do not have the password, please call the hospital operator.  01/30/2024, 3:48 PM

## 2024-01-31 ENCOUNTER — Inpatient Hospital Stay (HOSPITAL_COMMUNITY): Admitting: Anesthesiology

## 2024-01-31 ENCOUNTER — Encounter (HOSPITAL_COMMUNITY): Payer: Self-pay | Admitting: Internal Medicine

## 2024-01-31 ENCOUNTER — Encounter (HOSPITAL_COMMUNITY)
Admission: EM | Discharge status: Against Medical Advice | Payer: Self-pay | Source: Home / Self Care | Attending: Family Medicine

## 2024-01-31 DIAGNOSIS — K2210 Ulcer of esophagus without bleeding: Secondary | ICD-10-CM

## 2024-01-31 DIAGNOSIS — K208 Other esophagitis without bleeding: Secondary | ICD-10-CM

## 2024-01-31 DIAGNOSIS — R1319 Other dysphagia: Secondary | ICD-10-CM

## 2024-01-31 DIAGNOSIS — T182XXA Foreign body in stomach, initial encounter: Secondary | ICD-10-CM

## 2024-01-31 DIAGNOSIS — R1310 Dysphagia, unspecified: Secondary | ICD-10-CM

## 2024-01-31 DIAGNOSIS — K209 Esophagitis, unspecified without bleeding: Secondary | ICD-10-CM

## 2024-01-31 DIAGNOSIS — I132 Hypertensive heart and chronic kidney disease with heart failure and with stage 5 chronic kidney disease, or end stage renal disease: Secondary | ICD-10-CM

## 2024-01-31 DIAGNOSIS — I5042 Chronic combined systolic (congestive) and diastolic (congestive) heart failure: Secondary | ICD-10-CM

## 2024-01-31 DIAGNOSIS — R748 Abnormal levels of other serum enzymes: Secondary | ICD-10-CM

## 2024-01-31 DIAGNOSIS — N186 End stage renal disease: Secondary | ICD-10-CM

## 2024-01-31 DIAGNOSIS — K920 Hematemesis: Secondary | ICD-10-CM | POA: Diagnosis not present

## 2024-01-31 HISTORY — PX: ESOPHAGOGASTRODUODENOSCOPY: SHX5428

## 2024-01-31 LAB — BASIC METABOLIC PANEL WITH GFR
Anion gap: 12 (ref 5–15)
BUN: 30 mg/dL — ABNORMAL HIGH (ref 6–20)
CO2: 24 mmol/L (ref 22–32)
Calcium: 8 mg/dL — ABNORMAL LOW (ref 8.9–10.3)
Chloride: 97 mmol/L — ABNORMAL LOW (ref 98–111)
Creatinine, Ser: 5.43 mg/dL — ABNORMAL HIGH (ref 0.44–1.00)
GFR, Estimated: 10 mL/min — ABNORMAL LOW (ref >60)
Glucose, Bld: 193 mg/dL — ABNORMAL HIGH (ref 70–99)
Potassium: 4.7 mmol/L (ref 3.5–5.1)
Sodium: 133 mmol/L — ABNORMAL LOW (ref 135–145)

## 2024-01-31 LAB — HEPATIC FUNCTION PANEL
ALT: 24 U/L (ref 0–44)
AST: 33 U/L (ref 15–41)
Albumin: 2.5 g/dL — ABNORMAL LOW (ref 3.5–5.0)
Alkaline Phosphatase: 507 U/L — ABNORMAL HIGH (ref 38–126)
Bilirubin, Direct: 0.2 mg/dL (ref 0.0–0.2)
Indirect Bilirubin: 0.6 mg/dL (ref 0.3–0.9)
Total Bilirubin: 0.8 mg/dL (ref 0.0–1.2)
Total Protein: 5 g/dL — ABNORMAL LOW (ref 6.5–8.1)

## 2024-01-31 LAB — CULTURE, BLOOD (ROUTINE X 2)
Culture: NO GROWTH
Culture: NO GROWTH
Special Requests: ADEQUATE

## 2024-01-31 LAB — CBC
HCT: 34.2 % — ABNORMAL LOW (ref 36.0–46.0)
Hemoglobin: 10.3 g/dL — ABNORMAL LOW (ref 12.0–15.0)
MCH: 27.1 pg (ref 26.0–34.0)
MCHC: 30.1 g/dL (ref 30.0–36.0)
MCV: 90 fL (ref 80.0–100.0)
Platelets: 216 K/uL (ref 150–400)
RBC: 3.8 MIL/uL — ABNORMAL LOW (ref 3.87–5.11)
RDW: 15 % (ref 11.5–15.5)
WBC: 11.4 K/uL — ABNORMAL HIGH (ref 4.0–10.5)
nRBC: 0.2 % (ref 0.0–0.2)

## 2024-01-31 LAB — GLUCOSE, CAPILLARY
Glucose-Capillary: 251 mg/dL — ABNORMAL HIGH (ref 70–99)
Glucose-Capillary: 262 mg/dL — ABNORMAL HIGH (ref 70–99)
Glucose-Capillary: 94 mg/dL (ref 70–99)

## 2024-01-31 LAB — MAGNESIUM: Magnesium: 2.7 mg/dL — ABNORMAL HIGH (ref 1.7–2.4)

## 2024-01-31 SURGERY — EGD (ESOPHAGOGASTRODUODENOSCOPY)
Anesthesia: Monitor Anesthesia Care

## 2024-01-31 MED ORDER — SODIUM CHLORIDE 0.9 % IV SOLN: INTRAVENOUS | Status: DC | PRN

## 2024-01-31 MED ORDER — LIDOCAINE 2% (20 MG/ML) 5 ML SYRINGE
INTRAMUSCULAR | Status: DC | PRN
  Administered 2024-01-31: 40 mg via INTRAVENOUS

## 2024-01-31 MED ORDER — SUCRALFATE 1 GM/10ML PO SUSP
1.0000 g | Freq: Two times a day (BID) | ORAL | Status: DC
  Filled 2024-01-31: qty 10

## 2024-01-31 MED ORDER — PROPOFOL 500 MG/50ML IV EMUL
INTRAVENOUS | Status: DC | PRN
  Administered 2024-01-31: 60 mg via INTRAVENOUS
  Administered 2024-01-31 (×2): 30 mg via INTRAVENOUS
  Administered 2024-01-31: 100 ug/kg/min via INTRAVENOUS

## 2024-01-31 MED ORDER — CHLORHEXIDINE GLUCONATE CLOTH 2 % EX PADS: 6.0000 | MEDICATED_PAD | Freq: Every day | CUTANEOUS | Status: DC

## 2024-01-31 NOTE — Progress Notes (Signed)
 Pt stated that she wanted to speak to MD, her daughter was throwing a fit because she isn't home. Pt educated in monitoring her glucose levels was included in her current plan of care. Pt stated that she wanted to leave AMA. Notified Franky, MD & filled out the necessary AMA paperwork. Pt then stated that if her daughter was asleep that she would stay. Pt no longer expressed wishes to leave AMA.  Lonell LITTIE Lyme, RN

## 2024-01-31 NOTE — Op Note (Signed)
 [facility] Patient Name: Ashley Freeman Procedure Date : 01/31/2024 MRN: [id number] Attending MD: Sandor Flatter , MD, [id number] Date of Birth: 07-14-1990 CSN: [id number] Age: 33 Admit Type: Inpatient Procedure:                Upper GI endoscopy Indications:              Dysphagia, Odynophagia, Hematemesis, Nausea with                            vomiting                           33 year old female admitted with DKA with                            associated nausea/vomiting and blood-streaked                            emesis. Reports new onset odynophagia and dysphagia                            over the last couple of days. Providers:                Sandor Flatter, MD, Jacquelyn Jaci Pierce, RN,                            Curtistine Bishop, Technician Referring MD:              Medicines:                Monitored Anesthesia Care Complications:            No immediate complications. Estimated Blood Loss:     Estimated blood loss: none. Procedure:                Pre-Anesthesia Assessment:                           - Prior to the procedure, a History and Physical                            was performed, and patient medications and                            allergies were reviewed. The patient's tolerance of                            previous anesthesia was also reviewed. The risks                            and benefits of the procedure and the sedation                            options and risks were discussed with the patient.                            All questions were answered, and informed consent  was obtained. Prior Anticoagulants: The patient has                            taken no anticoagulant or antiplatelet agents. ASA                            Grade Assessment: III - A patient with severe                            systemic disease. After reviewing the risks and                            benefits, the patient  was deemed in satisfactory                            condition to undergo the procedure.                           After obtaining informed consent, the endoscope was                            passed under direct vision. Throughout the                            procedure, the patient's blood pressure, pulse, and                            oxygen saturations were monitored continuously. The                            [id number]-[id number] ([id number]) Olympus endoscope was introduced                            through the mouth, and advanced to the second part                            of duodenum. The upper GI endoscopy was                            accomplished without difficulty. The patient                            tolerated the procedure well. Scope In: Scope Out: Findings:      LA Grade D (one or more mucosal breaks involving at least 75% of       esophageal circumference) esophagitis with no bleeding was found in the       mid and lower third of the esophagus.      A large amount of food (residue) was found in the gastric fundus and in       the gastric body.      The visualized mucosa in the gastric antrum and pylorus was otherwise       normal, albeit on very limited exam due to retained food.      The examined duodenum was normal. Impression:               -  LA Grade D esophagitis with no bleeding.                           - A large amount of food (residue) in the stomach.                           - Normal mucosa was found in the antrum and in the                            pylorus.                           - Normal examined duodenum.                           - No specimens collected. Recommendation:           - Return patient to hospital ward for ongoing care.                           - Restart full liquids now and slowly advance diet                            as tolerated. Recommend low-fat, low fiber diet for                            the time being to facilitate gastric  emptying.                           - Use Protonix  (pantoprazole ) 40 mg PO BID for 8                            weeks.                           - Use sucralfate  suspension 1 gram PO BID for 2                            weeks. Plan for limited course of Carafate  due to                            ESRD. Can confirm with her Nephrology team if                            2-week course acceptable.                           - If continued nausea/vomiting as outpatient                            despite resolution of DKA, may consider outpatient                            Gastric Emptying Study to evaluate for  gastroparesis.                           - To follow-up with her primary Gastroenterologist                            at [facility] as an outpatient.                           - Inpatient GI service will sign off at this time.                            Please do not hesitate to contact us  with                            additional questions or concerns. Procedure Code(s):        --- Professional ---                           [phone], Esophagogastroduodenoscopy, flexible,                            transoral; diagnostic, including collection of                            specimen(s) by brushing or washing, when performed                            (separate procedure) Diagnosis Code(s):        --- Professional ---                           K20.90, Esophagitis, unspecified without bleeding                           R13.10, Dysphagia, unspecified                           K92.0, Hematemesis                           R11.2, Nausea with vomiting, unspecified CPT copyright 2022 American Medical Association. All rights reserved. The codes documented in this report are preliminary and upon coder review may  be revised to meet current compliance requirements. Sandor Flatter, MD 01/31/2024 4:51:02 PM Number of Addenda: 0

## 2024-01-31 NOTE — Progress Notes (Signed)
 Pt expressed wishes to leave AMA, paperwork filled out, attempted to notify [place], MD.  Lonell LITTIE Lyme, RN

## 2024-01-31 NOTE — Interval H&P Note (Signed)
 History and Physical Interval Note:  01/31/2024 3:37 PM  Ashley Freeman  has presented today for surgery, with the diagnosis of odynophagia, dysphagia.  The various methods of treatment have been discussed with the patient and family. After consideration of risks, benefits and other options for treatment, the patient has consented to  Procedure(s): EGD (ESOPHAGOGASTRODUODENOSCOPY) (N/A) as a surgical intervention.  The patient's history has been reviewed, patient examined, no change in status, stable for surgery.  I have reviewed the patient's chart and labs.  Questions were answered to the patient's satisfaction.     Sandor GAILS [name]

## 2024-01-31 NOTE — Anesthesia Preprocedure Evaluation (Signed)
 Anesthesia Evaluation  Patient identified by MRN, date of birth, ID band Patient awake    Reviewed: Allergy & Precautions, NPO status , Patient's Chart, lab work & pertinent test results, reviewed documented beta blocker date and time   History of Anesthesia Complications Negative for: history of anesthetic complications  Airway Mallampati: II  TM Distance: >3 FB Neck ROM: Full    Dental no notable dental hx. (+) Dental Advisory Given, Teeth Intact   Pulmonary neg pulmonary ROS   Pulmonary exam normal breath sounds clear to auscultation       Cardiovascular hypertension, Pt. on medications +CHF  Normal cardiovascular exam+ Valvular Problems/Murmurs  Rhythm:Regular Rate:Normal - Systolic murmurs  '25 TTE - EF 25 to 30%. Global hypokinesis. Grade II diastolic dysfunction (pseudonormalization). Mildly D-shaped septum suggestive of RV pressure/volume overload. Right ventricular systolic function is mildly reduced. Left atrial size was mildly dilated. Right atrial size was mildly dilated. Trivial mitral valve regurgitation. Tricuspid valve regurgitation is severe. A small pericardial effusion is present.     Neuro/Psych Seizures -, Well Controlled,  PSYCHIATRIC DISORDERS  Depression Bipolar Disorder      GI/Hepatic negative GI ROS, Neg liver ROS,,,Patient with complaints of trouble swallowing. Last vomit was two days ago at dialysis. Patient had breakfast this morning which she finished around 8am (8hrs prior). She says the breakfast went down OK albeit she had to go slowly.   Endo/Other  diabetes, Type 1, Insulin  Dependent    K 5.5 yesterday   Renal/GU ESRF and DialysisRenal diseaseLast dialyzed two days ago.    Chemistry         Component                Value               Date/Time                 NA                       133 (L)             01/31/2024 0809           NA                       136                 07/13/2021 1553            NA                       137                 11/12/2013 0423           K                        4.7                 01/31/2024 0809           K                        4.2                 11/12/2013 0423           CL  97 (L)              01/31/2024 0809           CL                       107                 11/12/2013 0423           CO2                      24                  01/31/2024 0809           CO2                      23                  11/12/2013 0423           BUN                      30 (H)              01/31/2024 0809           BUN                      51 (H)              07/13/2021 1553           BUN                      7                   11/12/2013 0423           CREATININE               5.43 (H)            01/31/2024 0809           CREATININE               0.59 (L)            11/12/2013 0423           CREATININE               0.51                08/24/2013 0957             Component                Value               Date/Time                 CALCIUM                   8.0 (L)             01/31/2024 0809           CALCIUM                   8.2 (L)             11/12/2013 0423           ALKPHOS  507 (H)             01/31/2024 0809           ALKPHOS                  77                  11/10/2013 2043           AST                      33                  01/31/2024 0809           AST                      12 (L)              11/10/2013 2043           ALT                      24                  01/31/2024 0809           ALT                      10 (L)              11/10/2013 2043           BILITOT                  0.8                 01/31/2024 0809           BILITOT                  <0.2                09/08/2019 0945           BILITOT                  0.5                 11/10/2013 2043            Musculoskeletal negative musculoskeletal ROS (+)    Abdominal   Peds  Hematology negative hematology ROS (+)   Anesthesia  Other Findings Past Medical History: 08/24/2013: Abscess, gluteal, right 02/19/2012: Anemia 09/19/2013: Bartholin's gland abscess No date: Bipolar disorder (HCC) 11/24/2015: BV (bacterial vaginosis) No date: Depression 2001: Diabetes mellitus type I (HCC)     Comment:  Diagnosed at age 3 ; Type I 05/30/2016: Diarrhea 08/19/2013: DKA (diabetic ketoacidoses)     Comment:  Also in 2018 No date: ESRD (end stage renal disease) (HCC) 08/2011: Gonorrhea     Comment:  Treated in 09/2011 No date: HFrEF (heart failure with reduced ejection fraction) (HCC)     Comment:  a. 2022 Echo: EF 40%; b. 10/2021 Echo: EF 55%; b. 07/2022               MV: No ischemia. EF 31%; c. 08/2022 Echo: EF 35%, mildly               dil RV, sev TR. 05/31/2016: History of trichomoniasis 03/28/2016: Hyperlipidemia No date: Hypertension No  date: NICM (nonischemic cardiomyopathy) (HCC) 09/19/2013: Sepsis (HCC)   Reproductive/Obstetrics                              Anesthesia Physical Anesthesia Plan  ASA: 4  Anesthesia Plan: MAC   Post-op Pain Management: Minimal or no pain anticipated   Induction: Intravenous  PONV Risk Score and Plan: 3 and Treatment may vary due to age or medical condition, Ondansetron , TIVA and Propofol  infusion  Airway Management Planned: Natural Airway  Additional Equipment: None  Intra-op Plan:   Post-operative Plan:   Informed Consent: I have reviewed the patients History and Physical, chart, labs and discussed the procedure including the risks, benefits and alternatives for the proposed anesthesia with the patient or authorized representative who has indicated his/her understanding and acceptance.     Dental advisory given  Plan Discussed with: CRNA and Anesthesiologist  Anesthesia Plan Comments: (Discussed risks of anesthesia with patient, including possibility of difficulty with spontaneous ventilation under anesthesia necessitating airway  intervention, PONV, and rare risks such as cardiac or respiratory or neurological events, and allergic reactions. Discussed the role of CRNA in patient's perioperative care. Patient understands.)         Anesthesia Quick Evaluation

## 2024-01-31 NOTE — Progress Notes (Signed)
 PROGRESS NOTE    Ashley Freeman  FMW:[id number] DOB: 21-Aug-1990 DOA: 01/26/2024 PCP: Keven Crumbly Pap, MD  Chief Complaint  Patient presents with   Weakness   Hematemesis    Brief Narrative:   33 year old female with history of HTN, HLD, ESRD on HD MWF, CHF with reduced EF, DM1, bipolar disorder, noncompliance with multiple admissions for DKA/fluid overload admitted for nausea and vomiting. Was recently in the ER on 9/26 for shortness of breath after missing dialysis. Symptoms improved after HD session therefore discharged home. Now noted to be hyperglycemic with elevated anion gap and beta hydroxybutyrate acid. Started on insulin  drip.   Assessment & Plan:   Principal Problem:   DKA, type 1 (HCC) Active Problems:   Lactic acidosis   SIRS (systemic inflammatory response syndrome) (HCC)   Hematemesis with nausea   Anemia due to chronic kidney disease   Prolonged QT interval   ESRD on dialysis (HCC)   Chronic combined systolic and diastolic heart failure (HCC)   Essential hypertension   Bipolar disorder (HCC)  DKA  T1DM  Hypoglycemia AG now resolved Continue basal, SSI   Trend BMP  Generalized Pain  Chest Pain Chest pain is reproducible - she notes this is worse after eating - strange description with reproducibility with palpation of chest + worse with PO intake.  Consider musculoskeletal CP, diabetic gastroparesis (description not classic), esophagitis, etc. Troponin negative and flat Trial sucralfate , trial reglan  - will see how she does Continue PPI   Hematemesis  Nausea  Odynophagia Suspected gastritis/mallory weiss PPI BID  Hemoccult pending Will c/s GI given persistent pain with PO intake  SIRS Lactic Acidosis Suspected related to DKA CXR without acute cardiopulm process UA if able to collect Trend leukocytosis No si/sx infection  Elevated LFT's Now resolved - persistent, chronically elevated alk phos, but bilirubin/ast/alt have  normalized Will hold off on RUQ US  with resolution of acute abnormalities and her hx cholecystectomy  Headache Trial fioricet , described as migraine, monitor  ESRD on HD Dialysis per renal   Anemia of Chronic Disease Stable  HFrEF Bumex , coreg , entresto on hold with hypotension   Hypotension (hypertension) Holding antihypertensives  Prolonged Qtc Caution with qt prolonging meds Monitor and replace lytes  Bipolar Disorder Cymbalta , lamictal      DVT prophylaxis: SCD Code Status: full Family Communication: none Disposition:   Status is: Inpatient Remains inpatient appropriate because: need for continued inpatient care   Consultants:  none  Procedures:  none  Antimicrobials:  Anti-infectives (From admission, onward)    None       Subjective: C/o continued chest pain with swallowing   Objective: Vitals:   01/30/24 2037 01/30/24 2327 01/31/24 0531 01/31/24 0735  BP: (!) 146/82 (!) 118/57 (!) 166/83 (!) 153/73  Pulse: 74 79    Resp: 18 18 16 18   Temp: 98.3 F (36.8 C) 98.1 F (36.7 C) 98.5 F (36.9 C) 98 F (36.7 C)  TempSrc: Oral Oral Oral Oral  SpO2: 95% 96% 94% 95%  Weight:   58 kg   Height:        Intake/Output Summary (Last 24 hours) at 01/31/2024 0848 Last data filed at 01/31/2024 0000 Gross per 24 hour  Intake 600 ml  Output 5000 ml  Net -4400 ml   Filed Weights   01/30/24 0955 01/30/24 1042 01/31/24 0531  Weight: 57.5 kg 55 kg 58 kg    Examination:  General: No acute distress. Cardiovascular: RRR Lungs: unlabored Abdomen: Soft, nontender, nondistended Neurological: Alert and  oriented 3. Moves all extremities 4 with equal strength. Cranial nerves II through XII grossly intact.. Extremities: No clubbing or cyanosis. No edema.   Data Reviewed: I have personally reviewed following labs and imaging studies  CBC: Recent Labs  Lab 01/24/24 2356 01/26/24 1218 01/26/24 1829 01/28/24 0520 01/28/24 2329 01/29/24 1302  01/30/24 0258 01/31/24 0809  WBC 10.9* 15.5*   < > 15.4* 16.2* 11.3* 13.1* 11.4*  NEUTROABS 4.8 12.8*  --   --   --  7.4  --   --   HGB 12.1 9.6*   < > 9.7* 10.1* 10.3* 9.9* 10.3*  HCT 39.4 32.7*   < > 30.5* 33.0* 34.0* 32.6* 34.2*  MCV 89.3 94.5   < > 85.4 87.5 90.4 89.6 90.0  PLT 359 340   < > 268 258 180 214 216   < > = values in this interval not displayed.    Basic Metabolic Panel: Recent Labs  Lab 01/26/24 1218 01/26/24 1829 01/28/24 0520 01/28/24 1120 01/28/24 1911 01/28/24 2329 01/29/24 0746 01/29/24 1302 01/30/24 0258  NA 136   < > 139   < > 137 136 136 134* 135  K 5.1   < > 3.7   < > 3.8 4.1 4.2 4.7 4.5  CL 92*   < > 99   < > 97* 96* 91* 95* 98  CO2 <7*   < > 25   < > 24 26 27  21* 25  GLUCOSE 589*   < > 97   < > 154* 101* 142* 164* 158*  BUN 54*   < > 61*   < > 32* 33* 34* 35* 41*  CREATININE 8.22*   < > 8.20*   < > 5.60* 5.64* 5.78* 6.05* 6.61*  CALCIUM  8.9   < > 8.2*   < > 8.4* 8.2* 8.6* 8.1* 8.1*  MG  --   --  2.7*  --   --  2.2  --   --  2.5*  PHOS 9.6*  --  7.9*  --   --   --   --   --   --    < > = values in this interval not displayed.    GFR: Estimated Creatinine Clearance: 10.2 mL/min (Glendell Fouse) (by C-G formula based on SCr of 6.61 mg/dL (H)).  Liver Function Tests: Recent Labs  Lab 01/26/24 1218 01/29/24 1302 01/30/24 0258  AST 18 35 50*  ALT 34 20 24  ALKPHOS 558* 493* 469*  BILITOT 3.1* 1.0 0.6  PROT 6.2* 4.6* 4.5*  ALBUMIN  3.4* 2.4* 2.2*    CBG: Recent Labs  Lab 01/30/24 1212 01/30/24 1239 01/30/24 1657 01/30/24 2142 01/31/24 0524  GLUCAP 47* 164* 164* 147* 251*     Recent Results (from the past 240 hours)  Resp panel by RT-PCR (RSV, Flu Wrenna Saks&B, Covid) Anterior Nasal Swab     Status: None   Collection Time: 01/24/24 11:56 PM   Specimen: Anterior Nasal Swab  Result Value Ref Range Status   SARS Coronavirus 2 by RT PCR NEGATIVE NEGATIVE Final   Influenza Kawanna Christley by PCR NEGATIVE NEGATIVE Final   Influenza B by PCR NEGATIVE NEGATIVE Final     Comment: (NOTE) The Xpert Xpress SARS-CoV-2/FLU/RSV plus assay is intended as an aid in the diagnosis of influenza from Nasopharyngeal swab specimens and should not be used as Brendaly Townsel sole basis for treatment. Nasal washings and aspirates are unacceptable for Xpert Xpress SARS-CoV-2/FLU/RSV testing.  Fact Sheet for Patients: BloggerCourse.com  Fact  Sheet for Healthcare Providers: SeriousBroker.it  This test is not yet approved or cleared by the [place]  FDA and has been authorized for detection and/or diagnosis of SARS-CoV-2 by FDA under an Emergency Use Authorization (EUA). This EUA will remain in effect (meaning this test can be used) for the duration of the COVID-19 declaration under Section 564(b)(1) of the Act, 21 U.S.C. section 360bbb-3(b)(1), unless the authorization is terminated or revoked.     Resp Syncytial Virus by PCR NEGATIVE NEGATIVE Final    Comment: (NOTE) Fact Sheet for Patients: BloggerCourse.com  Fact Sheet for Healthcare Providers: SeriousBroker.it  This test is not yet approved or cleared by the [place]  FDA and has been authorized for detection and/or diagnosis of SARS-CoV-2 by FDA under an Emergency Use Authorization (EUA). This EUA will remain in effect (meaning this test can be used) for the duration of the COVID-19 declaration under Section 564(b)(1) of the Act, 21 U.S.C. section 360bbb-3(b)(1), unless the authorization is terminated or revoked.  Performed at [facility], [address], [place], [place] [address]   Resp panel by RT-PCR (RSV, Flu Jaxton Casale&B, Covid) Anterior Nasal Swab     Status: None   Collection Time: 01/26/24 12:24 PM   Specimen: Anterior Nasal Swab  Result Value Ref Range Status   SARS Coronavirus 2 by RT PCR NEGATIVE NEGATIVE Final   Influenza Emerick Weatherly by PCR NEGATIVE NEGATIVE Final   Influenza B by PCR NEGATIVE NEGATIVE Final     Comment: (NOTE) The Xpert Xpress SARS-CoV-2/FLU/RSV plus assay is intended as an aid in the diagnosis of influenza from Nasopharyngeal swab specimens and should not be used as Patrina Andreas sole basis for treatment. Nasal washings and aspirates are unacceptable for Xpert Xpress SARS-CoV-2/FLU/RSV testing.  Fact Sheet for Patients: BloggerCourse.com  Fact Sheet for Healthcare Providers: SeriousBroker.it  This test is not yet approved or cleared by the [place]  FDA and has been authorized for detection and/or diagnosis of SARS-CoV-2 by FDA under an Emergency Use Authorization (EUA). This EUA will remain in effect (meaning this test can be used) for the duration of the COVID-19 declaration under Section 564(b)(1) of the Act, 21 U.S.C. section 360bbb-3(b)(1), unless the authorization is terminated or revoked.     Resp Syncytial Virus by PCR NEGATIVE NEGATIVE Final    Comment: (NOTE) Fact Sheet for Patients: BloggerCourse.com  Fact Sheet for Healthcare Providers: SeriousBroker.it  This test is not yet approved or cleared by the [place]  FDA and has been authorized for detection and/or diagnosis of SARS-CoV-2 by FDA under an Emergency Use Authorization (EUA). This EUA will remain in effect (meaning this test can be used) for the duration of the COVID-19 declaration under Section 564(b)(1) of the Act, 21 U.S.C. section 360bbb-3(b)(1), unless the authorization is terminated or revoked.  Performed at [facility], [address] [place], [place] [address]   Blood culture (routine x 2)     Status: None (Preliminary result)   Collection Time: 01/26/24 12:35 PM   Specimen: BLOOD LEFT ARM  Result Value Ref Range Status   Specimen Description BLOOD LEFT ARM  Final   Special Requests   Final    BOTTLES DRAWN AEROBIC AND ANAEROBIC Blood Culture results may not be optimal due to an  inadequate volume of blood received in culture bottles   Culture   Final    NO GROWTH 4 DAYS Performed at [facility], [address] [place], [place] [address]    Report Status PENDING  Incomplete  Blood culture (routine x 2)     Status: None (Preliminary result)   Collection Time: 01/26/24  6:29 PM   Specimen: BLOOD LEFT HAND  Result Value Ref Range Status   Specimen Description BLOOD LEFT HAND  Final   Special Requests   Final    BOTTLES DRAWN AEROBIC AND ANAEROBIC Blood Culture adequate volume   Culture   Final    NO GROWTH 4 DAYS Performed at [facility], [address] [place], [place] [address]    Report Status PENDING  Incomplete  MRSA Next Gen by PCR, Nasal     Status: None   Collection Time: 01/26/24  7:26 PM   Specimen: Nasal Mucosa; Nasal Swab  Result Value Ref Range Status   MRSA by PCR Next Gen NOT DETECTED NOT DETECTED Final    Comment: (NOTE) The GeneXpert MRSA Assay (FDA approved for NASAL specimens only), is one component of Ernan Runkles comprehensive MRSA colonization surveillance program. It is not intended to diagnose MRSA infection nor to guide or monitor treatment for MRSA infections. Test performance is not FDA approved in patients less than 42 years old. Performed at [facility], [address], [place], [place] [address]          Radiology Studies: No results found.      Scheduled Meds:  calcitRIOL   1.25 mcg Oral Q M,W,F   DULoxetine   20 mg Oral Daily   feeding supplement (NEPRO CARB STEADY)  237 mL Oral BID BM   insulin  aspart  0-5 Units Subcutaneous QHS   insulin  aspart  0-6 Units Subcutaneous TID WC   insulin  glargine  8 Units Subcutaneous Daily   lamoTRIgine   200 mg Oral Daily   lidocaine   1 patch Transdermal Q24H   metoCLOPramide   5 mg Oral TID AC & HS   multivitamin  1 tablet Oral QHS   pantoprazole   40 mg Oral BID   rosuvastatin   40 mg Oral Daily   sodium bicarbonate   650 mg Oral BID   sodium chloride  flush  3 mL  Intravenous Q12H   sucralfate   1 g Oral TID WC & HS   Continuous Infusions:   LOS: 4 days    Time spent: over 30 min     Meliton Monte, MD [facility]   To contact the attending provider between 7A-7P or the covering provider during after hours 7P-7A, please log into the web site [email or web address] and access using universal [place] password for that web site. If you do not have the password, please call the hospital operator.  01/31/2024, 8:48 AM

## 2024-01-31 NOTE — H&P (View-Only) (Signed)
 Consultation Note   Referring Provider:   [facility] PCP: Keven Crumbly Pap, MD Primary Gastroenterologist: [facility] Reason for Consultation:  Odynophagia / dysphagia DOA: 01/26/2024         Hospital Day: 6   ASSESSMENT    33 year old female type 1 diabetes and ESRD with multiple admissions for DKA  / fluid overload .  Currently admitted with SIRS ( infectious workup negative), DKA   Acute on chronic nausea / vomiting in setting of DKA) with reported hematemesis ( POA) Odynophagia / dysphagia ( acute this admission) Probably has diabetic gastroparesis. No vomiting now in 3-4 days. Denies having had dark stools.  Has distal chest pain with PO intake and food feels like it is getting stuck in esophagus. No obvious oral candida. Rule out erosive esophagitis, Mallory-Weiss tear. Regarding hematemesis on admission, hgb has remained stable. She has had wide variations in hemoglobin throughout hospital admissions but baseline hgb seems to be 9-10 .  Hepatomegaly. Chronically elevated alkaline phosphatase ?  Related to renal disease.  Remainder of liver chemistries normal.  No hepatobiliary findings on noncontrast CT AP  Chronically prolonged QT Limits anti-emetic options. She does take Zofran  BID at home  Type 1 diabetes Multiple admissions for DKA /fluid overload after missing dialysis  ESRD on HD MWF  Chronically elevated alkaline phosphatase  ( 400-500) CT scan shows hepatomegaly, no other hepatobiliary findings .   CHF with reduced EF Last echocardiogram noted EF to be 30 to 35% with grade 2 diastolic dysfunction when checked 08/29/2023.   Bipolar disorder  Hypertension  See PMH for any additional medical history  / medical problems  Principal Problem:   DKA, type 1 (HCC) Active Problems:   Lactic acidosis   Essential hypertension   SIRS (systemic inflammatory response syndrome) (HCC)   Anemia due to chronic kidney  disease   ESRD on dialysis (HCC)   Chronic combined systolic and diastolic heart failure (HCC)   Prolonged QT interval   Bipolar disorder (HCC)   Hematemesis   PLAN:   --Offered EGD, possibly today if Anesthesia is available. Otherwise will be tomorrow.  The risks and benefits of EGD with possible biopsies were discussed with the patient who agrees to proceed. I made her NPO until determined whether EGD can be done.   HPI   Ashley Freeman was admitted 01/26/2024 for nausea, dark emesis and dark stool.  She had missed dialysis the day prior due to transportation issues.  In the ED her hemoglobin was at baseline.  Her white count was mildly elevated.  She was tachypneic.  She met SIRS criteria and found to be in DKA. Started on insulin  drip. No infectious process identified.  No acute processes on CT scan. Patient has chronic nausea and vomiting for which she takes Zofran  at home twice a day.  She has not had any vomiting in 4 days now.  She has been having distal chest pain when she tries to eat or swallow liquids.  She does not recall having the symptoms prior to hospital admission.  She gives a history of GERD .  She takes daily medication for GERD but cannot remember the name of the medication.  She describes her GERD symptoms as belching and  heartburn.  Daily famotidine  listed on home med list.  Also omeprazole  as needed .  She is also taking pancreatic enzymes     Relevant workup thus far: Alk phos 507 Tbili normal  Normal transaminases Hgb 10.3 MCV 34 Noncontrast CTAP-  Findings suggestive of volume overload. 2. Interval increase in trace right pleural effusion. 3. Moderate volume simple ascites. 4. Diffuse subcutaneus soft tissue edema. 5. Similar-appearing multiple prominent retroperitoneal and pelvic lymph nodes. 6. Hepatomegaly. 7. Cardiomegaly.  CXR - no acute findings.   Pertinent GI Studies    Labs and Imaging:  Recent Labs    01/29/24 1302 01/30/24 0258 01/31/24 0809   PROT 4.6* 4.5* 5.0*  ALBUMIN  2.4* 2.2* 2.5*  AST 35 50* 33  ALT 20 24 24   ALKPHOS 493* 469* 507*  BILITOT 1.0 0.6 0.8  BILIDIR 0.3* 0.1 0.2  IBILI 0.7 0.5 0.6   Recent Labs    01/29/24 1302 01/30/24 0258 01/31/24 0809  WBC 11.3* 13.1* 11.4*  HGB 10.3* 9.9* 10.3*  HCT 34.0* 32.6* 34.2*  MCV 90.4 89.6 90.0  PLT 180 214 216   Recent Labs    01/29/24 1302 01/30/24 0258 01/31/24 0809  NA 134* 135 133*  K 4.7 4.5 4.7  CL 95* 98 97*  CO2 21* 25 24  GLUCOSE 164* 158* 193*  BUN 35* 41* 30*  CREATININE 6.05* 6.61* 5.43*  CALCIUM  8.1* 8.1* 8.0*     DG Chest Portable 1 View EXAM: 1 VIEW(S) XRAY OF THE CHEST 01/26/2024 12:45:00 PM  COMPARISON: 01/24/2024  CLINICAL HISTORY: weakness. Reason for exam:weakness; Triage notes: Pt presents to ED via EMS from home with complaint of vomiting black stuff, no black stool began last night. Weakness. Dialyzed yesterday, missed an appointment or two due to transportation issues.  FINDINGS:  LUNGS AND PLEURA: Low lung volumes. No focal pulmonary opacity. No pulmonary edema. No pleural effusion. No pneumothorax.  HEART AND MEDIASTINUM: Persistent cardiomegaly. No acute abnormality of the mediastinal silhouette.  BONES AND SOFT TISSUES: No acute osseous abnormality.  IMPRESSION: 1. No acute cardiopulmonary process identified.  Electronically signed by: Waddell Calk MD 01/26/2024 01:06 PM EDT RP Workstation: [id number]    Past Medical History:  Diagnosis Date   Abscess, gluteal, right 08/24/2013   Anemia 02/19/2012   Bartholin's gland abscess 09/19/2013   Bipolar disorder (HCC)    BV (bacterial vaginosis) 11/24/2015   Depression    Diabetes mellitus type I (HCC) 2001   Diagnosed at age 78 ; Type I   Diarrhea 05/30/2016   DKA (diabetic ketoacidoses) 08/19/2013   Also in 2018   ESRD (end stage renal disease) (HCC)    Gonorrhea 08/2011   Treated in 09/2011   HFrEF (heart failure with reduced ejection fraction)  (HCC)    a. 2022 Echo: EF 40%; b. 10/2021 Echo: EF 55%; b. 07/2022 MV: No ischemia. EF 31%; c. 08/2022 Echo: EF 35%, mildly dil RV, sev TR.   History of trichomoniasis 05/31/2016   Hyperlipidemia 03/28/2016   Hypertension    NICM (nonischemic cardiomyopathy) (HCC)    Sepsis (HCC) 09/19/2013    Past Surgical History:  Procedure Laterality Date   A/V FISTULAGRAM Right 06/17/2023   Procedure: A/V Fistulagram;  Surgeon: Marea Selinda RAMAN, MD;  Location: [facility] INVASIVE CV LAB;  Service: Cardiovascular;  Laterality: Right;   A/V SHUNT INTERVENTION N/A 09/25/2023   Procedure: A/V SHUNT INTERVENTION;  Surgeon: Pearline Norman RAMAN, MD;  Location: [facility] PV LAB;  Service: Cardiovascular;  Laterality: N/A;  AV FISTULA PLACEMENT Right 07/06/2022   Procedure: ARTERIOVENOUS GRAFT CREATION;  Surgeon: Gretta Lonni PARAS, MD;  Location: [facility] OR;  Service: Vascular;  Laterality: Right;   CESAREAN SECTION N/A 10/05/2019   Procedure: CESAREAN SECTION;  Surgeon: Izell Harari, MD;  Location: [facility] LD ORS;  Service: Obstetrics;  Laterality: N/A;   CHOLECYSTECTOMY N/A 07/02/2023   Procedure: LAPAROSCOPIC CHOLECYSTECTOMY;  Surgeon: Ebbie Cough, MD;  Location: [facility] OR;  Service: General;  Laterality: N/A;   INCISION AND DRAINAGE ABSCESS Left 09/28/2019   Procedure: INCISION AND DRAINAGE VULVAR ABCESS;  Surgeon: Edsel Norleen GAILS, MD;  Location: [facility] OR;  Service: Gynecology;  Laterality: Left;   INCISION AND DRAINAGE PERIRECTAL ABSCESS Right 08/18/2013   Procedure: IRRIGATION AND DEBRIDEMENT GLUTEAL ABSCESS;  Surgeon: Lynda Leos, MD;  Location: [facility] OR;  Service: General;  Laterality: Right;   INCISION AND DRAINAGE PERIRECTAL ABSCESS Right 09/19/2013   Procedure: IRRIGATION AND DEBRIDEMENT RIGHT GLUTEAL AND LABIAL ABSCESSES;  Surgeon: Lynda Leos, MD;  Location: [facility] OR;  Service: General;  Laterality: Right;   INCISION AND DRAINAGE PERIRECTAL ABSCESS Right 09/24/2013   Procedure: IRRIGATION AND DEBRIDEMENT PERIRECTAL  ABSCESS;  Surgeon: Lynwood MALVA Pina, MD;  Location: [facility] OR;  Service: General;  Laterality: Right;   IR PARACENTESIS  08/28/2023   IR PARACENTESIS  11/04/2023   IR PARACENTESIS  12/23/2023    Family History  Problem Relation Age of Onset   Asthma Mother    Carpal tunnel syndrome Mother    Gout Father    Diabetes Paternal Grandmother    Anesthesia problems Neg Hx     Prior to Admission medications   Medication Sig Start Date End Date Taking? Authorizing Provider  NOVOLOG  FLEXPEN 100 UNIT/ML FlexPen INJECT 5 UNITS UNDER THE SKIN 3 TIMES A DAY BEFORE MEALS. IF YOU DO NOT EAT A MEAL, PLEASE CHEECK YOUR BLOOD SUGAR AND GIVE YOURSELF 1 UNIT OF INSULIN  FOR EVERY 50MG /DL GREATER THAN 869. MAX 30 UNITS PER DAY. 01/18/24  Yes [provider]  albuterol  (VENTOLIN  HFA) 108 (90 Base) MCG/ACT inhaler Inhale 2 puffs into the lungs every 4 (four) hours as needed for wheezing or shortness of breath. 11/24/22   [provider]  bumetanide  (BUMEX ) 2 MG tablet Take 10 mg by mouth daily.    [provider]  calcitRIOL  (ROCALTROL ) 0.25 MCG capsule Take 5 capsules (1.25 mcg total) by mouth every Tuesday, Thursday, and Saturday at 6 PM. Patient taking differently: Take 1.25 mcg by mouth every Monday, Wednesday, and Friday. 07/09/23   Cindy Garnette POUR, MD  carvedilol  (COREG ) 25 MG tablet Take 25 mg by mouth 2 (two) times daily with a meal.    [provider]  DULoxetine  (CYMBALTA ) 20 MG capsule Take 20 mg by mouth daily. 07/29/23   [provider]  famotidine  (PEPCID ) 20 MG tablet Take 20 mg by mouth daily. 07/29/23   [provider]  fluticasone  (FLONASE ) 50 MCG/ACT nasal spray Place 2 sprays into both nostrils daily. 12/11/23 12/10/24  [provider]  hydrOXYzine  (ATARAX ) 25 MG tablet Take 25 mg by mouth 3 (three) times daily as needed for anxiety, itching, nausea or vomiting.    [provider]  insulin  aspart (NOVOLOG ) 100 UNIT/ML injection Inject 5  Units into the skin 3 (three) times daily with meals.    [provider]  ketoconazole  (NIZORAL ) 2 % cream Apply 1 Application topically 2 (two) times daily. Apply 1gm twice daily to affected skin on feet 01/06/24   McCaughan, Dia D,  DPM  lamoTRIgine  (LAMICTAL ) 200 MG tablet Take 1 tablet (200 mg total) by mouth daily. 10/29/23   Regalado, Belkys A, MD  LANTUS  SOLOSTAR 100 UNIT/ML Solostar Pen Inject 10 Units into the skin daily.    [provider]  OLANZapine  zydis (ZYPREXA ) 5 MG disintegrating tablet Take 5 mg by mouth at bedtime. 09/23/23 02/08/24  [provider]  omeprazole  (PRILOSEC) 40 MG capsule Take 40 mg by mouth daily as needed (for acid refulx). 12/11/23 02/09/24  [provider]  ondansetron  (ZOFRAN -ODT) 4 MG disintegrating tablet Take 1 tablet (4 mg total) by mouth every 8 (eight) hours as needed for nausea or vomiting. 06/23/23   Davis, Jonathon H, MD  oxyCODONE -acetaminophen  (PERCOCET/ROXICET) 5-325 MG tablet Take 1 tablet by mouth every 4 (four) hours as needed for moderate pain (pain score 4-6). 01/20/24   [provider]  Pancrelipase , Lip-Prot-Amyl, 3000-9500 units CPEP Take 3,000 units of lipase by mouth 3 (three) times daily before meals. 09/23/23 02/08/24  [provider]  prochlorperazine  (COMPAZINE ) 5 MG tablet Take 5 mg by mouth every 6 (six) hours as needed for nausea or vomiting.    [provider]  rosuvastatin  (CRESTOR ) 40 MG tablet Take 40 mg by mouth daily. 01/20/24 01/19/25  [provider]  sacubitril-valsartan (ENTRESTO) 24-26 MG Take 1 tablet by mouth 2 (two) times daily.    [provider]  sodium bicarbonate  650 MG tablet Take 650 mg by mouth 2 (two) times daily. 07/29/23   [provider]  SUMAtriptan  (IMITREX ) 50 MG tablet Take 50 mg by mouth every 2 (two) hours as needed for migraine or headache. 06/20/22   [provider]    Current Facility-Administered Medications   Medication Dose Route Frequency Provider Last Rate Last Admin   acetaminophen  (TYLENOL ) tablet 650 mg  650 mg Oral Q6H PRN Smith, Rondell A, MD       Or   acetaminophen  (TYLENOL ) suppository 650 mg  650 mg Rectal Q6H PRN Smith, Rondell A, MD       alum & mag hydroxide-simeth (MAALOX/MYLANTA) 200-200-20 MG/5ML suspension 30 mL  30 mL Oral Q4H PRN Perri DELENA Meliton Mickey., MD   30 mL at 01/31/24 9272   artificial tears ophthalmic solution 1 drop  1 drop Both Eyes PRN Perri DELENA Meliton Mickey., MD   1 drop at 01/31/24 0116   butalbital -acetaminophen -caffeine  (FIORICET ) 50-325-40 MG per tablet 1-2 tablet  1-2 tablet Oral Q6H PRN Perri DELENA Meliton Mickey., MD       calcitRIOL  (ROCALTROL ) capsule 1.25 mcg  1.25 mcg Oral Q M,W,F Smith, Rondell A, MD   1.25 mcg at 01/29/24 1115   dextrose  50 % solution 0-50 mL  0-50 mL Intravenous PRN Amin, Ankit C, MD   50 mL at 01/30/24 1216   DULoxetine  (CYMBALTA ) DR capsule 20 mg  20 mg Oral Daily Smith, Rondell A, MD   20 mg at 01/30/24 1157   feeding supplement (NEPRO CARB STEADY) liquid 237 mL  237 mL Oral BID BM Perri DELENA Meliton Mickey., MD       hydrOXYzine  (ATARAX ) tablet 25 mg  25 mg Oral TID PRN Perri DELENA Meliton Mickey., MD   25 mg at 01/29/24 1521   insulin  aspart (novoLOG ) injection 0-5 Units  0-5 Units Subcutaneous QHS Perri DELENA Meliton Mickey., MD       insulin  aspart (novoLOG ) injection 0-6 Units  0-6 Units Subcutaneous TID WC Perri DELENA Meliton Mickey., MD   3 Units at 01/31/24 0600  insulin  glargine (LANTUS ) injection 8 Units  8 Units Subcutaneous Daily Perri DELENA Meliton Mickey., MD       ipratropium-albuterol  (DUONEB) 0.5-2.5 (3) MG/3ML nebulizer solution 3 mL  3 mL Nebulization Q4H PRN Amin, Ankit C, MD       lamoTRIgine  (LAMICTAL ) tablet 200 mg  200 mg Oral Daily Claudene, Rondell A, MD   200 mg at 01/30/24 1157   lidocaine  (LIDODERM ) 5 % 1 patch  1 patch Transdermal Q24H Perri DELENA Meliton Mickey., MD       metoCLOPramide  (REGLAN ) tablet 5 mg  5 mg Oral TID AC & HS Perri DELENA Meliton Mickey., MD   5 mg at 01/31/24 0557   multivitamin (RENA-VIT) tablet 1 tablet  1 tablet Oral QHS Perri DELENA Meliton Mickey., MD   1 tablet at 01/30/24 2158   ondansetron  (ZOFRAN ) injection 4 mg  4 mg Intravenous Q6H PRN Amin, Ankit C, MD   4 mg at 01/28/24 1103   Oral care mouth rinse  15 mL Mouth Rinse PRN Amin, Ankit C, MD       oxyCODONE  (Oxy IR/ROXICODONE ) immediate release tablet 5 mg  5 mg Oral Q4H PRN Perri DELENA Meliton Mickey., MD   5 mg at 01/30/24 1156   pantoprazole  (PROTONIX ) EC tablet 40 mg  40 mg Oral BID Perri DELENA Meliton Mickey., MD   40 mg at 01/30/24 2158   rosuvastatin  (CRESTOR ) tablet 40 mg  40 mg Oral Daily Smith, Rondell A, MD   40 mg at 01/30/24 1157   senna-docusate (Senokot-S) tablet 1 tablet  1 tablet Oral QHS PRN Amin, Ankit C, MD       sodium bicarbonate  tablet 650 mg  650 mg Oral BID Smith, Rondell A, MD   650 mg at 01/30/24 2158   sodium chloride  flush (NS) 0.9 % injection 3 mL  3 mL Intravenous Q12H Smith, Rondell A, MD   3 mL at 01/30/24 2158   sucralfate  (CARAFATE ) 1 GM/10ML suspension 1 g  1 g Oral TID WC & HS Perri DELENA Meliton Mickey., MD   1 g at 01/31/24 0557   traZODone (DESYREL) tablet 50 mg  50 mg Oral QHS PRN Amin, Ankit C, MD   50 mg at 01/30/24 2158   trimethobenzamide  (TIGAN ) injection 200 mg  200 mg Intramuscular Q6H PRN Claudene Reeves A, MD   200 mg at 01/27/24 0517    Allergies as of 01/26/2024 - Review Complete 01/26/2024  Allergen Reaction Noted   Keflex  [cephalexin ] Anaphylaxis 06/28/2020   Penicillins Anaphylaxis, Hives, and Rash 11/11/2010   Vibramycin  [doxycycline ] Anaphylaxis 12/10/2017   Benadryl  [diphenhydramine ] Itching 05/30/2016   Dilaudid  [hydromorphone ] Itching 07/11/2023   Methotrexate derivatives Rash 07/01/2023   Roxicodone  [oxycodone ] Itching 07/20/2023    Social History   Socioeconomic History   Marital status: Single    Spouse name: Not on file   Number of children: 0   Years of education: 11th grade   Highest education level:  Not on file  Occupational History   Occupation: unemployed    Comment: has never worked  Tobacco Use   Smoking status: Never    Passive exposure: Never   Smokeless tobacco: Never  Vaping Use   Vaping status: Never Used  Substance and Sexual Activity   Alcohol use: Not Currently   Drug use: No   Sexual activity: Yes    Birth control/protection: None  Other Topics Concern   Not on file  Social History Narrative   Patient lives in  [place] mother lives in [place].  Unemployed.  Previously worked for a Customer service manager.  Completed 11 grade working on [employer]. Patient 3 brothers    Social Drivers of Corporate investment banker Strain: Medium Risk (11/15/2023)   Received from [facility]   Overall Financial Resource Strain (CARDIA)    How hard is it for you to pay for the very basics like food, housing, medical care, and heating?: Somewhat hard  Food Insecurity: No Food Insecurity (01/26/2024)   Hunger Vital Sign    Worried About Running Out of Food in the Last Year: Never true    Ran Out of Food in the Last Year: Never true  Transportation Needs: No Transportation Needs (01/26/2024)   PRAPARE - Administrator, Civil Service (Medical): No    Lack of Transportation (Non-Medical): No  Recent Concern: Transportation Needs - Unmet Transportation Needs (12/22/2023)   PRAPARE - Transportation    Lack of Transportation (Medical): Yes    Lack of Transportation (Non-Medical): Yes  Physical Activity: Insufficiently Active (03/02/2022)   [facility]   Exercise Vital Sign    On average, how many days per week do you engage in moderate to strenuous exercise (like a brisk walk)?: 2 days    On average, how many minutes do you engage in exercise at this level?: 10 min  Stress: No Stress Concern Present (03/08/2023)   Received from [facility] - Occupational Stress Questionnaire    Feeling of Stress : Not at all  Social  Connections: Moderately Integrated (12/22/2023)   Social Connection and Isolation Panel    Frequency of Communication with Friends and Family: More than three times a week    Frequency of Social Gatherings with Friends and Family: Once a week    Attends Religious Services: More than 4 times per year    Active Member of [employer] or Organizations: Yes    Attends Banker Meetings: Never    Marital Status: Never married  Intimate Partner Violence: Not At Risk (01/26/2024)   Humiliation, Afraid, Rape, and Kick questionnaire    Fear of Current or Ex-Partner: No    Emotionally Abused: No    Physically Abused: No    Sexually Abused: No     Code Status   Code Status: Full Code  Review of Systems: All systems reviewed and negative except where noted in HPI.  Physical Exam: Vital signs in last 24 hours: Temp:  [97.8 F (36.6 C)-98.5 F (36.9 C)] 98 F (36.7 C) (10/03 0735) Pulse Rate:  [69-79] 79 (10/02 2327) Resp:  [12-19] 18 (10/03 0735) BP: (118-167)/(57-103) 153/73 (10/03 0735) SpO2:  [94 %-100 %] 95 % (10/03 0735) Weight:  [55 kg-58 kg] 58 kg (10/03 0531) Last BM Date : 01/29/24  General:  Pleasant female in NAD Psych:  Cooperative. Normal mood and affect Eyes: Pupils equal Ears:  Normal auditory acuity Nose: No deformity, discharge or lesions Neck:  Supple, no masses felt Lungs:  Clear to auscultation.  Heart:  Regular rate, regular rhythm.  Abdomen:  Soft,  mildly distended with hypoactive bowel sounds. Nontender, active bowel sounds, no masses felt Rectal :  Deferred Msk: Symmetrical without gross deformities.  Neurologic:  Alert, oriented, grossly normal neurologically Extremities : No edema Skin:  Intact without significant lesions.    Intake/Output from previous day: 10/02 0701 - 10/03 0700 In: 600 [P.O.:600] Out: 5000  Intake/Output this shift:  No intake/output  data recorded.   Vina Dasen, NP-C   01/31/2024, 9:17 AM   Attending physician's  note   I have taken a history, reviewed the chart, and examined the patient. I performed a substantive portion of this encounter, including complete performance of at least one of the key components, in conjunction with the APP. I agree with the APP's note, impression, and recommendations with my edits.   33 year old female with medical history as outlined above, to include history of ESRD on HD, HTN, HLD, CHFrEF, diabetes, bipolar, QTc prolongation, anemia of chronic disease, admitted with DKA with associated nausea/vomiting.  GI service consulted as she has been endorsing odynophagia and dysphagia over the last couple of days.  Symptoms started after hospital admission.  No prior history of odynophagia or dysphagia.  Does not recall a previous EGD.  Did have nausea/vomiting prior to admission and some blood-streaked emesis.  1) Odynophagia 2) Dysphagia 3) Nausea/vomiting 4) Hematemesis Discussed DDx with patient today to include Mallory-Weiss tear, infection, reflux changes, stricture etc. No appreciable improvement with appropriate trial of high-dose PPI and Carafate  so far. - Expedited EGD today for diagnostic and potentially therapeutic intent - Additional recommendations pending endoscopic findings  5) ESRD - HD per Nephrology  6) Elevated alkaline phosphatase - Chronically elevated and CT without duct dilation.  Remainder of liver enzymes and T. bili otherwise normal.  The indications, risks, and benefits of EGD were explained to the patient in detail. Risks include but are not limited to bleeding, perforation, adverse reaction to medications, and cardiopulmonary compromise. Sequelae include but are not limited to the possibility of surgery, hospitalization, and mortality. The patient verbalized understanding and wished to proceed. All questions answered.  I spent 55 minutes of time, including in depth chart review, independent review of results as outlined above, communicating results  with the patient directly, face-to-face time with the patient, coordinating care, ordering studies and medications as appropriate, and documentation.      [address], DO, FACG [phone] office

## 2024-01-31 NOTE — Transfer of Care (Signed)
 Immediate Anesthesia Transfer of Care Note  Patient: Ashley Freeman  Procedure(s) Performed: EGD (ESOPHAGOGASTRODUODENOSCOPY)  Patient Location: PACU  Anesthesia Type:MAC  Level of Consciousness: drowsy  Airway & Oxygen Therapy: Patient Spontanous Breathing  Post-op Assessment: Report given to RN and Post -op Vital signs reviewed and stable  Post vital signs: Reviewed and stable  Last Vitals:  Vitals Value Taken Time  BP 130/84   Temp 97   Pulse 72   Resp 20   SpO2 93   + thrill right arm AVGG  Last Pain:  Vitals:   01/31/24 1530  TempSrc: Temporal  PainSc: 0-No pain      Patients Stated Pain Goal: 0 (01/30/24 1945)  Complications: No notable events documented.

## 2024-01-31 NOTE — Consult Note (Addendum)
 Consultation Note   Referring Provider:   [facility] PCP: Keven Crumbly Pap, MD Primary Gastroenterologist: [facility] Reason for Consultation:  Odynophagia / dysphagia DOA: 01/26/2024         Hospital Day: 6   ASSESSMENT    33 year old female type 1 diabetes and ESRD with multiple admissions for DKA  / fluid overload .  Currently admitted with SIRS ( infectious workup negative), DKA   Acute on chronic nausea / vomiting in setting of DKA) with reported hematemesis ( POA) Odynophagia / dysphagia ( acute this admission) Probably has diabetic gastroparesis. No vomiting now in 3-4 days. Denies having had dark stools.  Has distal chest pain with PO intake and food feels like it is getting stuck in esophagus. No obvious oral candida. Rule out erosive esophagitis, Mallory-Weiss tear. Regarding hematemesis on admission, hgb has remained stable. She has had wide variations in hemoglobin throughout hospital admissions but baseline hgb seems to be 9-10 .  Hepatomegaly. Chronically elevated alkaline phosphatase ?  Related to renal disease.  Remainder of liver chemistries normal.  No hepatobiliary findings on noncontrast CT AP  Chronically prolonged QT Limits anti-emetic options. She does take Zofran  BID at home  Type 1 diabetes Multiple admissions for DKA /fluid overload after missing dialysis  ESRD on HD MWF  Chronically elevated alkaline phosphatase  ( 400-500) CT scan shows hepatomegaly, no other hepatobiliary findings .   CHF with reduced EF Last echocardiogram noted EF to be 30 to 35% with grade 2 diastolic dysfunction when checked 08/29/2023.   Bipolar disorder  Hypertension  See PMH for any additional medical history  / medical problems  Principal Problem:   DKA, type 1 (HCC) Active Problems:   Lactic acidosis   Essential hypertension   SIRS (systemic inflammatory response syndrome) (HCC)   Anemia due to chronic kidney  disease   ESRD on dialysis (HCC)   Chronic combined systolic and diastolic heart failure (HCC)   Prolonged QT interval   Bipolar disorder (HCC)   Hematemesis   PLAN:   --Offered EGD, possibly today if Anesthesia is available. Otherwise will be tomorrow.  The risks and benefits of EGD with possible biopsies were discussed with the patient who agrees to proceed. I made her NPO until determined whether EGD can be done.   HPI   Ashley Freeman was admitted 01/26/2024 for nausea, dark emesis and dark stool.  She had missed dialysis the day prior due to transportation issues.  In the ED her hemoglobin was at baseline.  Her white count was mildly elevated.  She was tachypneic.  She met SIRS criteria and found to be in DKA. Started on insulin  drip. No infectious process identified.  No acute processes on CT scan. Patient has chronic nausea and vomiting for which she takes Zofran  at home twice a day.  She has not had any vomiting in 4 days now.  She has been having distal chest pain when she tries to eat or swallow liquids.  She does not recall having the symptoms prior to hospital admission.  She gives a history of GERD .  She takes daily medication for GERD but cannot remember the name of the medication.  She describes her GERD symptoms as belching and  heartburn.  Daily famotidine  listed on home med list.  Also omeprazole  as needed .  She is also taking pancreatic enzymes     Relevant workup thus far: Alk phos 507 Tbili normal  Normal transaminases Hgb 10.3 MCV 34 Noncontrast CTAP-  Findings suggestive of volume overload. 2. Interval increase in trace right pleural effusion. 3. Moderate volume simple ascites. 4. Diffuse subcutaneus soft tissue edema. 5. Similar-appearing multiple prominent retroperitoneal and pelvic lymph nodes. 6. Hepatomegaly. 7. Cardiomegaly.  CXR - no acute findings.   Pertinent GI Studies    Labs and Imaging:  Recent Labs    01/29/24 1302 01/30/24 0258 01/31/24 0809   PROT 4.6* 4.5* 5.0*  ALBUMIN  2.4* 2.2* 2.5*  AST 35 50* 33  ALT 20 24 24   ALKPHOS 493* 469* 507*  BILITOT 1.0 0.6 0.8  BILIDIR 0.3* 0.1 0.2  IBILI 0.7 0.5 0.6   Recent Labs    01/29/24 1302 01/30/24 0258 01/31/24 0809  WBC 11.3* 13.1* 11.4*  HGB 10.3* 9.9* 10.3*  HCT 34.0* 32.6* 34.2*  MCV 90.4 89.6 90.0  PLT 180 214 216   Recent Labs    01/29/24 1302 01/30/24 0258 01/31/24 0809  NA 134* 135 133*  K 4.7 4.5 4.7  CL 95* 98 97*  CO2 21* 25 24  GLUCOSE 164* 158* 193*  BUN 35* 41* 30*  CREATININE 6.05* 6.61* 5.43*  CALCIUM  8.1* 8.1* 8.0*     DG Chest Portable 1 View EXAM: 1 VIEW(S) XRAY OF THE CHEST 01/26/2024 12:45:00 PM  COMPARISON: 01/24/2024  CLINICAL HISTORY: weakness. Reason for exam:weakness; Triage notes: Pt presents to ED via EMS from home with complaint of vomiting black stuff, no black stool began last night. Weakness. Dialyzed yesterday, missed an appointment or two due to transportation issues.  FINDINGS:  LUNGS AND PLEURA: Low lung volumes. No focal pulmonary opacity. No pulmonary edema. No pleural effusion. No pneumothorax.  HEART AND MEDIASTINUM: Persistent cardiomegaly. No acute abnormality of the mediastinal silhouette.  BONES AND SOFT TISSUES: No acute osseous abnormality.  IMPRESSION: 1. No acute cardiopulmonary process identified.  Electronically signed by: Waddell Calk MD 01/26/2024 01:06 PM EDT RP Workstation: [id number]    Past Medical History:  Diagnosis Date   Abscess, gluteal, right 08/24/2013   Anemia 02/19/2012   Bartholin's gland abscess 09/19/2013   Bipolar disorder (HCC)    BV (bacterial vaginosis) 11/24/2015   Depression    Diabetes mellitus type I (HCC) 2001   Diagnosed at age 78 ; Type I   Diarrhea 05/30/2016   DKA (diabetic ketoacidoses) 08/19/2013   Also in 2018   ESRD (end stage renal disease) (HCC)    Gonorrhea 08/2011   Treated in 09/2011   HFrEF (heart failure with reduced ejection fraction)  (HCC)    a. 2022 Echo: EF 40%; b. 10/2021 Echo: EF 55%; b. 07/2022 MV: No ischemia. EF 31%; c. 08/2022 Echo: EF 35%, mildly dil RV, sev TR.   History of trichomoniasis 05/31/2016   Hyperlipidemia 03/28/2016   Hypertension    NICM (nonischemic cardiomyopathy) (HCC)    Sepsis (HCC) 09/19/2013    Past Surgical History:  Procedure Laterality Date   A/V FISTULAGRAM Right 06/17/2023   Procedure: A/V Fistulagram;  Surgeon: Marea Selinda RAMAN, MD;  Location: [facility] INVASIVE CV LAB;  Service: Cardiovascular;  Laterality: Right;   A/V SHUNT INTERVENTION N/A 09/25/2023   Procedure: A/V SHUNT INTERVENTION;  Surgeon: Pearline Norman RAMAN, MD;  Location: [facility] PV LAB;  Service: Cardiovascular;  Laterality: N/A;  AV FISTULA PLACEMENT Right 07/06/2022   Procedure: ARTERIOVENOUS GRAFT CREATION;  Surgeon: Gretta Lonni PARAS, MD;  Location: [facility] OR;  Service: Vascular;  Laterality: Right;   CESAREAN SECTION N/A 10/05/2019   Procedure: CESAREAN SECTION;  Surgeon: Izell Harari, MD;  Location: [facility] LD ORS;  Service: Obstetrics;  Laterality: N/A;   CHOLECYSTECTOMY N/A 07/02/2023   Procedure: LAPAROSCOPIC CHOLECYSTECTOMY;  Surgeon: Ebbie Cough, MD;  Location: [facility] OR;  Service: General;  Laterality: N/A;   INCISION AND DRAINAGE ABSCESS Left 09/28/2019   Procedure: INCISION AND DRAINAGE VULVAR ABCESS;  Surgeon: Edsel Norleen GAILS, MD;  Location: [facility] OR;  Service: Gynecology;  Laterality: Left;   INCISION AND DRAINAGE PERIRECTAL ABSCESS Right 08/18/2013   Procedure: IRRIGATION AND DEBRIDEMENT GLUTEAL ABSCESS;  Surgeon: Lynda Leos, MD;  Location: [facility] OR;  Service: General;  Laterality: Right;   INCISION AND DRAINAGE PERIRECTAL ABSCESS Right 09/19/2013   Procedure: IRRIGATION AND DEBRIDEMENT RIGHT GLUTEAL AND LABIAL ABSCESSES;  Surgeon: Lynda Leos, MD;  Location: [facility] OR;  Service: General;  Laterality: Right;   INCISION AND DRAINAGE PERIRECTAL ABSCESS Right 09/24/2013   Procedure: IRRIGATION AND DEBRIDEMENT PERIRECTAL  ABSCESS;  Surgeon: Lynwood MALVA Pina, MD;  Location: [facility] OR;  Service: General;  Laterality: Right;   IR PARACENTESIS  08/28/2023   IR PARACENTESIS  11/04/2023   IR PARACENTESIS  12/23/2023    Family History  Problem Relation Age of Onset   Asthma Mother    Carpal tunnel syndrome Mother    Gout Father    Diabetes Paternal Grandmother    Anesthesia problems Neg Hx     Prior to Admission medications   Medication Sig Start Date End Date Taking? Authorizing Provider  NOVOLOG  FLEXPEN 100 UNIT/ML FlexPen INJECT 5 UNITS UNDER THE SKIN 3 TIMES A DAY BEFORE MEALS. IF YOU DO NOT EAT A MEAL, PLEASE CHEECK YOUR BLOOD SUGAR AND GIVE YOURSELF 1 UNIT OF INSULIN  FOR EVERY 50MG /DL GREATER THAN 869. MAX 30 UNITS PER DAY. 01/18/24  Yes [provider]  albuterol  (VENTOLIN  HFA) 108 (90 Base) MCG/ACT inhaler Inhale 2 puffs into the lungs every 4 (four) hours as needed for wheezing or shortness of breath. 11/24/22   [provider]  bumetanide  (BUMEX ) 2 MG tablet Take 10 mg by mouth daily.    [provider]  calcitRIOL  (ROCALTROL ) 0.25 MCG capsule Take 5 capsules (1.25 mcg total) by mouth every Tuesday, Thursday, and Saturday at 6 PM. Patient taking differently: Take 1.25 mcg by mouth every Monday, Wednesday, and Friday. 07/09/23   Cindy Garnette POUR, MD  carvedilol  (COREG ) 25 MG tablet Take 25 mg by mouth 2 (two) times daily with a meal.    [provider]  DULoxetine  (CYMBALTA ) 20 MG capsule Take 20 mg by mouth daily. 07/29/23   [provider]  famotidine  (PEPCID ) 20 MG tablet Take 20 mg by mouth daily. 07/29/23   [provider]  fluticasone  (FLONASE ) 50 MCG/ACT nasal spray Place 2 sprays into both nostrils daily. 12/11/23 12/10/24  [provider]  hydrOXYzine  (ATARAX ) 25 MG tablet Take 25 mg by mouth 3 (three) times daily as needed for anxiety, itching, nausea or vomiting.    [provider]  insulin  aspart (NOVOLOG ) 100 UNIT/ML injection Inject 5  Units into the skin 3 (three) times daily with meals.    [provider]  ketoconazole  (NIZORAL ) 2 % cream Apply 1 Application topically 2 (two) times daily. Apply 1gm twice daily to affected skin on feet 01/06/24   McCaughan, Dia D,  DPM  lamoTRIgine  (LAMICTAL ) 200 MG tablet Take 1 tablet (200 mg total) by mouth daily. 10/29/23   Regalado, Belkys A, MD  LANTUS  SOLOSTAR 100 UNIT/ML Solostar Pen Inject 10 Units into the skin daily.    [provider]  OLANZapine  zydis (ZYPREXA ) 5 MG disintegrating tablet Take 5 mg by mouth at bedtime. 09/23/23 02/08/24  [provider]  omeprazole  (PRILOSEC) 40 MG capsule Take 40 mg by mouth daily as needed (for acid refulx). 12/11/23 02/09/24  [provider]  ondansetron  (ZOFRAN -ODT) 4 MG disintegrating tablet Take 1 tablet (4 mg total) by mouth every 8 (eight) hours as needed for nausea or vomiting. 06/23/23   Davis, Jonathon H, MD  oxyCODONE -acetaminophen  (PERCOCET/ROXICET) 5-325 MG tablet Take 1 tablet by mouth every 4 (four) hours as needed for moderate pain (pain score 4-6). 01/20/24   [provider]  Pancrelipase , Lip-Prot-Amyl, 3000-9500 units CPEP Take 3,000 units of lipase by mouth 3 (three) times daily before meals. 09/23/23 02/08/24  [provider]  prochlorperazine  (COMPAZINE ) 5 MG tablet Take 5 mg by mouth every 6 (six) hours as needed for nausea or vomiting.    [provider]  rosuvastatin  (CRESTOR ) 40 MG tablet Take 40 mg by mouth daily. 01/20/24 01/19/25  [provider]  sacubitril-valsartan (ENTRESTO) 24-26 MG Take 1 tablet by mouth 2 (two) times daily.    [provider]  sodium bicarbonate  650 MG tablet Take 650 mg by mouth 2 (two) times daily. 07/29/23   [provider]  SUMAtriptan  (IMITREX ) 50 MG tablet Take 50 mg by mouth every 2 (two) hours as needed for migraine or headache. 06/20/22   [provider]    Current Facility-Administered Medications   Medication Dose Route Frequency Provider Last Rate Last Admin   acetaminophen  (TYLENOL ) tablet 650 mg  650 mg Oral Q6H PRN Smith, Rondell A, MD       Or   acetaminophen  (TYLENOL ) suppository 650 mg  650 mg Rectal Q6H PRN Smith, Rondell A, MD       alum & mag hydroxide-simeth (MAALOX/MYLANTA) 200-200-20 MG/5ML suspension 30 mL  30 mL Oral Q4H PRN Perri DELENA Meliton Mickey., MD   30 mL at 01/31/24 9272   artificial tears ophthalmic solution 1 drop  1 drop Both Eyes PRN Perri DELENA Meliton Mickey., MD   1 drop at 01/31/24 0116   butalbital -acetaminophen -caffeine  (FIORICET ) 50-325-40 MG per tablet 1-2 tablet  1-2 tablet Oral Q6H PRN Perri DELENA Meliton Mickey., MD       calcitRIOL  (ROCALTROL ) capsule 1.25 mcg  1.25 mcg Oral Q M,W,F Smith, Rondell A, MD   1.25 mcg at 01/29/24 1115   dextrose  50 % solution 0-50 mL  0-50 mL Intravenous PRN Amin, Ankit C, MD   50 mL at 01/30/24 1216   DULoxetine  (CYMBALTA ) DR capsule 20 mg  20 mg Oral Daily Smith, Rondell A, MD   20 mg at 01/30/24 1157   feeding supplement (NEPRO CARB STEADY) liquid 237 mL  237 mL Oral BID BM Perri DELENA Meliton Mickey., MD       hydrOXYzine  (ATARAX ) tablet 25 mg  25 mg Oral TID PRN Perri DELENA Meliton Mickey., MD   25 mg at 01/29/24 1521   insulin  aspart (novoLOG ) injection 0-5 Units  0-5 Units Subcutaneous QHS Perri DELENA Meliton Mickey., MD       insulin  aspart (novoLOG ) injection 0-6 Units  0-6 Units Subcutaneous TID WC Perri DELENA Meliton Mickey., MD   3 Units at 01/31/24 0600  insulin  glargine (LANTUS ) injection 8 Units  8 Units Subcutaneous Daily Perri DELENA Meliton Mickey., MD       ipratropium-albuterol  (DUONEB) 0.5-2.5 (3) MG/3ML nebulizer solution 3 mL  3 mL Nebulization Q4H PRN Amin, Ankit C, MD       lamoTRIgine  (LAMICTAL ) tablet 200 mg  200 mg Oral Daily Claudene, Rondell A, MD   200 mg at 01/30/24 1157   lidocaine  (LIDODERM ) 5 % 1 patch  1 patch Transdermal Q24H Perri DELENA Meliton Mickey., MD       metoCLOPramide  (REGLAN ) tablet 5 mg  5 mg Oral TID AC & HS Perri DELENA Meliton Mickey., MD   5 mg at 01/31/24 0557   multivitamin (RENA-VIT) tablet 1 tablet  1 tablet Oral QHS Perri DELENA Meliton Mickey., MD   1 tablet at 01/30/24 2158   ondansetron  (ZOFRAN ) injection 4 mg  4 mg Intravenous Q6H PRN Amin, Ankit C, MD   4 mg at 01/28/24 1103   Oral care mouth rinse  15 mL Mouth Rinse PRN Amin, Ankit C, MD       oxyCODONE  (Oxy IR/ROXICODONE ) immediate release tablet 5 mg  5 mg Oral Q4H PRN Perri DELENA Meliton Mickey., MD   5 mg at 01/30/24 1156   pantoprazole  (PROTONIX ) EC tablet 40 mg  40 mg Oral BID Perri DELENA Meliton Mickey., MD   40 mg at 01/30/24 2158   rosuvastatin  (CRESTOR ) tablet 40 mg  40 mg Oral Daily Smith, Rondell A, MD   40 mg at 01/30/24 1157   senna-docusate (Senokot-S) tablet 1 tablet  1 tablet Oral QHS PRN Amin, Ankit C, MD       sodium bicarbonate  tablet 650 mg  650 mg Oral BID Smith, Rondell A, MD   650 mg at 01/30/24 2158   sodium chloride  flush (NS) 0.9 % injection 3 mL  3 mL Intravenous Q12H Smith, Rondell A, MD   3 mL at 01/30/24 2158   sucralfate  (CARAFATE ) 1 GM/10ML suspension 1 g  1 g Oral TID WC & HS Perri DELENA Meliton Mickey., MD   1 g at 01/31/24 0557   traZODone (DESYREL) tablet 50 mg  50 mg Oral QHS PRN Amin, Ankit C, MD   50 mg at 01/30/24 2158   trimethobenzamide  (TIGAN ) injection 200 mg  200 mg Intramuscular Q6H PRN Claudene Reeves A, MD   200 mg at 01/27/24 0517    Allergies as of 01/26/2024 - Review Complete 01/26/2024  Allergen Reaction Noted   Keflex  [cephalexin ] Anaphylaxis 06/28/2020   Penicillins Anaphylaxis, Hives, and Rash 11/11/2010   Vibramycin  [doxycycline ] Anaphylaxis 12/10/2017   Benadryl  [diphenhydramine ] Itching 05/30/2016   Dilaudid  [hydromorphone ] Itching 07/11/2023   Methotrexate derivatives Rash 07/01/2023   Roxicodone  [oxycodone ] Itching 07/20/2023    Social History   Socioeconomic History   Marital status: Single    Spouse name: Not on file   Number of children: 0   Years of education: 11th grade   Highest education level:  Not on file  Occupational History   Occupation: unemployed    Comment: has never worked  Tobacco Use   Smoking status: Never    Passive exposure: Never   Smokeless tobacco: Never  Vaping Use   Vaping status: Never Used  Substance and Sexual Activity   Alcohol use: Not Currently   Drug use: No   Sexual activity: Yes    Birth control/protection: None  Other Topics Concern   Not on file  Social History Narrative   Patient lives in  [place] mother lives in [place].  Unemployed.  Previously worked for a Customer service manager.  Completed 11 grade working on [employer]. Patient 3 brothers    Social Drivers of Corporate investment banker Strain: Medium Risk (11/15/2023)   Received from [facility]   Overall Financial Resource Strain (CARDIA)    How hard is it for you to pay for the very basics like food, housing, medical care, and heating?: Somewhat hard  Food Insecurity: No Food Insecurity (01/26/2024)   Hunger Vital Sign    Worried About Running Out of Food in the Last Year: Never true    Ran Out of Food in the Last Year: Never true  Transportation Needs: No Transportation Needs (01/26/2024)   PRAPARE - Administrator, Civil Service (Medical): No    Lack of Transportation (Non-Medical): No  Recent Concern: Transportation Needs - Unmet Transportation Needs (12/22/2023)   PRAPARE - Transportation    Lack of Transportation (Medical): Yes    Lack of Transportation (Non-Medical): Yes  Physical Activity: Insufficiently Active (03/02/2022)   [facility]   Exercise Vital Sign    On average, how many days per week do you engage in moderate to strenuous exercise (like a brisk walk)?: 2 days    On average, how many minutes do you engage in exercise at this level?: 10 min  Stress: No Stress Concern Present (03/08/2023)   Received from [facility] - Occupational Stress Questionnaire    Feeling of Stress : Not at all  Social  Connections: Moderately Integrated (12/22/2023)   Social Connection and Isolation Panel    Frequency of Communication with Friends and Family: More than three times a week    Frequency of Social Gatherings with Friends and Family: Once a week    Attends Religious Services: More than 4 times per year    Active Member of [employer] or Organizations: Yes    Attends Banker Meetings: Never    Marital Status: Never married  Intimate Partner Violence: Not At Risk (01/26/2024)   Humiliation, Afraid, Rape, and Kick questionnaire    Fear of Current or Ex-Partner: No    Emotionally Abused: No    Physically Abused: No    Sexually Abused: No     Code Status   Code Status: Full Code  Review of Systems: All systems reviewed and negative except where noted in HPI.  Physical Exam: Vital signs in last 24 hours: Temp:  [97.8 F (36.6 C)-98.5 F (36.9 C)] 98 F (36.7 C) (10/03 0735) Pulse Rate:  [69-79] 79 (10/02 2327) Resp:  [12-19] 18 (10/03 0735) BP: (118-167)/(57-103) 153/73 (10/03 0735) SpO2:  [94 %-100 %] 95 % (10/03 0735) Weight:  [55 kg-58 kg] 58 kg (10/03 0531) Last BM Date : 01/29/24  General:  Pleasant female in NAD Psych:  Cooperative. Normal mood and affect Eyes: Pupils equal Ears:  Normal auditory acuity Nose: No deformity, discharge or lesions Neck:  Supple, no masses felt Lungs:  Clear to auscultation.  Heart:  Regular rate, regular rhythm.  Abdomen:  Soft,  mildly distended with hypoactive bowel sounds. Nontender, active bowel sounds, no masses felt Rectal :  Deferred Msk: Symmetrical without gross deformities.  Neurologic:  Alert, oriented, grossly normal neurologically Extremities : No edema Skin:  Intact without significant lesions.    Intake/Output from previous day: 10/02 0701 - 10/03 0700 In: 600 [P.O.:600] Out: 5000  Intake/Output this shift:  No intake/output  data recorded.   Vina Dasen, NP-C   01/31/2024, 9:17 AM   Attending physician's  note   I have taken a history, reviewed the chart, and examined the patient. I performed a substantive portion of this encounter, including complete performance of at least one of the key components, in conjunction with the APP. I agree with the APP's note, impression, and recommendations with my edits.   33 year old female with medical history as outlined above, to include history of ESRD on HD, HTN, HLD, CHFrEF, diabetes, bipolar, QTc prolongation, anemia of chronic disease, admitted with DKA with associated nausea/vomiting.  GI service consulted as she has been endorsing odynophagia and dysphagia over the last couple of days.  Symptoms started after hospital admission.  No prior history of odynophagia or dysphagia.  Does not recall a previous EGD.  Did have nausea/vomiting prior to admission and some blood-streaked emesis.  1) Odynophagia 2) Dysphagia 3) Nausea/vomiting 4) Hematemesis Discussed DDx with patient today to include Mallory-Weiss tear, infection, reflux changes, stricture etc. No appreciable improvement with appropriate trial of high-dose PPI and Carafate  so far. - Expedited EGD today for diagnostic and potentially therapeutic intent - Additional recommendations pending endoscopic findings  5) ESRD - HD per Nephrology  6) Elevated alkaline phosphatase - Chronically elevated and CT without duct dilation.  Remainder of liver enzymes and T. bili otherwise normal.  The indications, risks, and benefits of EGD were explained to the patient in detail. Risks include but are not limited to bleeding, perforation, adverse reaction to medications, and cardiopulmonary compromise. Sequelae include but are not limited to the possibility of surgery, hospitalization, and mortality. The patient verbalized understanding and wished to proceed. All questions answered.  I spent 55 minutes of time, including in depth chart review, independent review of results as outlined above, communicating results  with the patient directly, face-to-face time with the patient, coordinating care, ordering studies and medications as appropriate, and documentation.      [address], DO, FACG [phone] office

## 2024-01-31 NOTE — Progress Notes (Signed)
 [facility] Progress Note   Subjective:    Completed HD Thurs. Prefers not to have dialysis today.  Seen in room. C/o nausea, difficulty swallowing. GI evaluating   Objective Vitals:   01/30/24 2037 01/30/24 2327 01/31/24 0531 01/31/24 0735  BP: (!) 146/82 (!) 118/57 (!) 166/83 (!) 153/73  Pulse: 74 79    Resp: 18 18 16 18   Temp: 98.3 F (36.8 C) 98.1 F (36.7 C) 98.5 F (36.9 C) 98 F (36.7 C)  TempSrc: Oral Oral Oral Oral  SpO2: 95% 96% 94% 95%  Weight:   58 kg   Height:       Physical Exam General: Ill appearing, NAD.  Heart: RRR Lungs: CTAB Abdomen: soft, but distended Extremities: 1+ BLE edema Dialysis Access: AVF +t/b  Additional Objective Labs: Basic Metabolic Panel: Recent Labs  Lab 01/26/24 1218 01/26/24 1829 01/28/24 0520 01/28/24 1120 01/29/24 1302 01/30/24 0258 01/31/24 0809  NA 136   < > 139   < > 134* 135 133*  K 5.1   < > 3.7   < > 4.7 4.5 4.7  CL 92*   < > 99   < > 95* 98 97*  CO2 <7*   < > 25   < > 21* 25 24  GLUCOSE 589*   < > 97   < > 164* 158* 193*  BUN 54*   < > 61*   < > 35* 41* 30*  CREATININE 8.22*   < > 8.20*   < > 6.05* 6.61* 5.43*  CALCIUM  8.9   < > 8.2*   < > 8.1* 8.1* 8.0*  PHOS 9.6*  --  7.9*  --   --   --   --    < > = values in this interval not displayed.   Liver Function Tests: Recent Labs  Lab 01/29/24 1302 01/30/24 0258 01/31/24 0809  AST 35 50* 33  ALT 20 24 24   ALKPHOS 493* 469* 507*  BILITOT 1.0 0.6 0.8  PROT 4.6* 4.5* 5.0*  ALBUMIN  2.4* 2.2* 2.5*   CBC: Recent Labs  Lab 01/24/24 2356 01/26/24 1218 01/26/24 1829 01/28/24 0520 01/28/24 2329 01/29/24 1302 01/30/24 0258 01/31/24 0809  WBC 10.9* 15.5*   < > 15.4* 16.2* 11.3* 13.1* 11.4*  NEUTROABS 4.8 12.8*  --   --   --  7.4  --   --   HGB 12.1 9.6*   < > 9.7* 10.1* 10.3* 9.9* 10.3*  HCT 39.4 32.7*   < > 30.5* 33.0* 34.0* 32.6* 34.2*  MCV 89.3 94.5   < > 85.4 87.5 90.4 89.6 90.0  PLT 359 340   < > 268 258 180 214 216   < > = values  in this interval not displayed.   Studies/Results: No results found.  Medications:    calcitRIOL   1.25 mcg Oral Q M,W,F   DULoxetine   20 mg Oral Daily   feeding supplement (NEPRO CARB STEADY)  237 mL Oral BID BM   insulin  aspart  0-5 Units Subcutaneous QHS   insulin  aspart  0-6 Units Subcutaneous TID WC   insulin  glargine  8 Units Subcutaneous Daily   lamoTRIgine   200 mg Oral Daily   lidocaine   1 patch Transdermal Q24H   metoCLOPramide   5 mg Oral TID AC & HS   multivitamin  1 tablet Oral QHS   pantoprazole   40 mg Oral BID   rosuvastatin   40 mg Oral Daily   sodium bicarbonate   650 mg Oral  BID   sodium chloride  flush  3 mL Intravenous Q12H   sucralfate   1 g Oral TID WC & HS   Dialysis Orders MWF - [facility] ([address]) -> last dialyzed there on 01/07/24 4 hours, EDW 61kg, AVG  Assessment/Plan: DKA, T1DM: Last A1c 10.8%. Management per primary team.  Hematemesis: ?d/t vomiting, ongoing nausea. GI following.  ESRD: Usual MWF schedule. HD off schedule this week. Next HD Sat then resume MWF next week.  HTN/volume: BP improved.  Volume improved. below outpatient dry weight.  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  HFrEF (30-35%): Continue to optimize volume with HD    Maisie Ronnald Acosta PA-C [facility] 01/31/2024,8:54 AM

## 2024-02-01 NOTE — Discharge Summary (Addendum)
 Patient left AMA overnight.  See previous notes for details regarding care to this point.

## 2024-02-03 ENCOUNTER — Emergency Department (HOSPITAL_COMMUNITY)

## 2024-02-03 ENCOUNTER — Encounter (HOSPITAL_COMMUNITY): Payer: Self-pay | Admitting: Gastroenterology

## 2024-02-03 ENCOUNTER — Inpatient Hospital Stay (HOSPITAL_COMMUNITY)
Admission: EM | Admit: 2024-02-03 | Discharge: 2024-02-07 | DRG: 640 | Disposition: A | Discharge status: Home / Self Care | Attending: Family Medicine | Admitting: Family Medicine

## 2024-02-03 ENCOUNTER — Other Ambulatory Visit: Payer: Self-pay

## 2024-02-03 DIAGNOSIS — K3184 Gastroparesis: Secondary | ICD-10-CM | POA: Diagnosis present

## 2024-02-03 DIAGNOSIS — E875 Hyperkalemia: Secondary | ICD-10-CM | POA: Diagnosis present

## 2024-02-03 DIAGNOSIS — E8770 Fluid overload, unspecified: Secondary | ICD-10-CM | POA: Diagnosis present

## 2024-02-03 DIAGNOSIS — Z881 Allergy status to other antibiotic agents status: Secondary | ICD-10-CM

## 2024-02-03 DIAGNOSIS — Z794 Long term (current) use of insulin: Secondary | ICD-10-CM

## 2024-02-03 DIAGNOSIS — Z888 Allergy status to other drugs, medicaments and biological substances status: Secondary | ICD-10-CM

## 2024-02-03 DIAGNOSIS — E1022 Type 1 diabetes mellitus with diabetic chronic kidney disease: Secondary | ICD-10-CM | POA: Diagnosis present

## 2024-02-03 DIAGNOSIS — R1084 Generalized abdominal pain: Secondary | ICD-10-CM | POA: Diagnosis present

## 2024-02-03 DIAGNOSIS — N186 End stage renal disease: Secondary | ICD-10-CM | POA: Diagnosis present

## 2024-02-03 DIAGNOSIS — R0789 Other chest pain: Secondary | ICD-10-CM | POA: Diagnosis not present

## 2024-02-03 DIAGNOSIS — I428 Other cardiomyopathies: Secondary | ICD-10-CM | POA: Diagnosis present

## 2024-02-03 DIAGNOSIS — E1065 Type 1 diabetes mellitus with hyperglycemia: Secondary | ICD-10-CM | POA: Diagnosis present

## 2024-02-03 DIAGNOSIS — I5042 Chronic combined systolic (congestive) and diastolic (congestive) heart failure: Secondary | ICD-10-CM | POA: Diagnosis present

## 2024-02-03 DIAGNOSIS — E10319 Type 1 diabetes mellitus with unspecified diabetic retinopathy without macular edema: Secondary | ICD-10-CM | POA: Diagnosis present

## 2024-02-03 DIAGNOSIS — Z885 Allergy status to narcotic agent status: Secondary | ICD-10-CM

## 2024-02-03 DIAGNOSIS — Z91199 Patient's noncompliance with other medical treatment and regimen due to unspecified reason: Secondary | ICD-10-CM

## 2024-02-03 DIAGNOSIS — E1043 Type 1 diabetes mellitus with diabetic autonomic (poly)neuropathy: Secondary | ICD-10-CM | POA: Diagnosis present

## 2024-02-03 DIAGNOSIS — N2581 Secondary hyperparathyroidism of renal origin: Secondary | ICD-10-CM | POA: Diagnosis present

## 2024-02-03 DIAGNOSIS — E11319 Type 2 diabetes mellitus with unspecified diabetic retinopathy without macular edema: Secondary | ICD-10-CM | POA: Diagnosis present

## 2024-02-03 DIAGNOSIS — R072 Precordial pain: Secondary | ICD-10-CM | POA: Diagnosis not present

## 2024-02-03 DIAGNOSIS — I132 Hypertensive heart and chronic kidney disease with heart failure and with stage 5 chronic kidney disease, or end stage renal disease: Secondary | ICD-10-CM | POA: Diagnosis present

## 2024-02-03 DIAGNOSIS — Z79899 Other long term (current) drug therapy: Secondary | ICD-10-CM

## 2024-02-03 DIAGNOSIS — R739 Hyperglycemia, unspecified: Principal | ICD-10-CM | POA: Diagnosis present

## 2024-02-03 DIAGNOSIS — Z833 Family history of diabetes mellitus: Secondary | ICD-10-CM | POA: Diagnosis not present

## 2024-02-03 DIAGNOSIS — F319 Bipolar disorder, unspecified: Secondary | ICD-10-CM | POA: Diagnosis present

## 2024-02-03 DIAGNOSIS — R079 Chest pain, unspecified: Secondary | ICD-10-CM | POA: Diagnosis present

## 2024-02-03 DIAGNOSIS — R188 Other ascites: Secondary | ICD-10-CM | POA: Diagnosis present

## 2024-02-03 DIAGNOSIS — Z91158 Patient's noncompliance with renal dialysis for other reason: Secondary | ICD-10-CM

## 2024-02-03 DIAGNOSIS — R601 Generalized edema: Secondary | ICD-10-CM | POA: Diagnosis present

## 2024-02-03 DIAGNOSIS — D631 Anemia in chronic kidney disease: Secondary | ICD-10-CM | POA: Diagnosis present

## 2024-02-03 DIAGNOSIS — I10 Essential (primary) hypertension: Secondary | ICD-10-CM | POA: Diagnosis present

## 2024-02-03 DIAGNOSIS — Z992 Dependence on renal dialysis: Secondary | ICD-10-CM

## 2024-02-03 DIAGNOSIS — I5043 Acute on chronic combined systolic (congestive) and diastolic (congestive) heart failure: Secondary | ICD-10-CM | POA: Diagnosis present

## 2024-02-03 DIAGNOSIS — E1049 Type 1 diabetes mellitus with other diabetic neurological complication: Secondary | ICD-10-CM | POA: Diagnosis not present

## 2024-02-03 DIAGNOSIS — Z880 Allergy status to penicillin: Secondary | ICD-10-CM | POA: Diagnosis not present

## 2024-02-03 DIAGNOSIS — Z825 Family history of asthma and other chronic lower respiratory diseases: Secondary | ICD-10-CM

## 2024-02-03 DIAGNOSIS — N289 Disorder of kidney and ureter, unspecified: Secondary | ICD-10-CM | POA: Diagnosis not present

## 2024-02-03 DIAGNOSIS — E785 Hyperlipidemia, unspecified: Secondary | ICD-10-CM | POA: Diagnosis present

## 2024-02-03 DIAGNOSIS — E1140 Type 2 diabetes mellitus with diabetic neuropathy, unspecified: Secondary | ICD-10-CM | POA: Diagnosis present

## 2024-02-03 DIAGNOSIS — E1159 Type 2 diabetes mellitus with other circulatory complications: Secondary | ICD-10-CM | POA: Diagnosis present

## 2024-02-03 LAB — I-STAT VENOUS BLOOD GAS, ED
Acid-base deficit: 2 mmol/L (ref 0.0–2.0)
Bicarbonate: 23.5 mmol/L (ref 20.0–28.0)
Calcium, Ion: 1.06 mmol/L — ABNORMAL LOW (ref 1.15–1.40)
HCT: 35 % — ABNORMAL LOW (ref 36.0–46.0)
Hemoglobin: 11.9 g/dL — ABNORMAL LOW (ref 12.0–15.0)
O2 Saturation: 86 %
Potassium: 5.8 mmol/L — ABNORMAL HIGH (ref 3.5–5.1)
Sodium: 132 mmol/L — ABNORMAL LOW (ref 135–145)
TCO2: 25 mmol/L (ref 22–32)
pCO2, Ven: 40.5 mmHg — ABNORMAL LOW (ref 44–60)
pH, Ven: 7.372 (ref 7.25–7.43)
pO2, Ven: 52 mmHg — ABNORMAL HIGH (ref 32–45)

## 2024-02-03 LAB — CBC WITH DIFFERENTIAL/PLATELET
Abs Immature Granulocytes: 0.1 K/uL — ABNORMAL HIGH (ref 0.00–0.07)
Basophils Absolute: 0.1 K/uL (ref 0.0–0.1)
Basophils Relative: 1 %
Eosinophils Absolute: 1 K/uL — ABNORMAL HIGH (ref 0.0–0.5)
Eosinophils Relative: 7 %
HCT: 35.6 % — ABNORMAL LOW (ref 36.0–46.0)
Hemoglobin: 10.7 g/dL — ABNORMAL LOW (ref 12.0–15.0)
Immature Granulocytes: 1 %
Lymphocytes Relative: 30 %
Lymphs Abs: 4.1 K/uL — ABNORMAL HIGH (ref 0.7–4.0)
MCH: 26.9 pg (ref 26.0–34.0)
MCHC: 30.1 g/dL (ref 30.0–36.0)
MCV: 89.4 fL (ref 80.0–100.0)
Monocytes Absolute: 0.9 K/uL (ref 0.1–1.0)
Monocytes Relative: 6 %
Neutro Abs: 7.6 K/uL (ref 1.7–7.7)
Neutrophils Relative %: 55 %
Platelets: 327 K/uL (ref 150–400)
RBC: 3.98 MIL/uL (ref 3.87–5.11)
RDW: 14.4 % (ref 11.5–15.5)
WBC: 13.7 K/uL — ABNORMAL HIGH (ref 4.0–10.5)
nRBC: 0 % (ref 0.0–0.2)

## 2024-02-03 LAB — COMPREHENSIVE METABOLIC PANEL WITH GFR
ALT: 66 U/L — ABNORMAL HIGH (ref 0–44)
AST: 74 U/L — ABNORMAL HIGH (ref 15–41)
Albumin: 3.2 g/dL — ABNORMAL LOW (ref 3.5–5.0)
Alkaline Phosphatase: 655 U/L — ABNORMAL HIGH (ref 38–126)
Anion gap: 13 (ref 5–15)
BUN: 55 mg/dL — ABNORMAL HIGH (ref 6–20)
CO2: 22 mmol/L (ref 22–32)
Calcium: 8.7 mg/dL — ABNORMAL LOW (ref 8.9–10.3)
Chloride: 99 mmol/L (ref 98–111)
Creatinine, Ser: 8.2 mg/dL — ABNORMAL HIGH (ref 0.44–1.00)
GFR, Estimated: 6 mL/min — ABNORMAL LOW (ref >60)
Glucose, Bld: 539 mg/dL (ref 70–99)
Potassium: 5.9 mmol/L — ABNORMAL HIGH (ref 3.5–5.1)
Sodium: 134 mmol/L — ABNORMAL LOW (ref 135–145)
Total Bilirubin: 0.9 mg/dL (ref 0.0–1.2)
Total Protein: 6.3 g/dL — ABNORMAL LOW (ref 6.5–8.1)

## 2024-02-03 LAB — CBG MONITORING, ED
Glucose-Capillary: 478 mg/dL — ABNORMAL HIGH (ref 70–99)
Glucose-Capillary: 513 mg/dL (ref 70–99)
Glucose-Capillary: 536 mg/dL (ref 70–99)
Glucose-Capillary: 541 mg/dL (ref 70–99)
Glucose-Capillary: 432 mg/dL — ABNORMAL HIGH (ref 70–99)
Glucose-Capillary: 366 mg/dL — ABNORMAL HIGH (ref 70–99)
Glucose-Capillary: 230 mg/dL — ABNORMAL HIGH (ref 70–99)
Glucose-Capillary: 170 mg/dL — ABNORMAL HIGH (ref 70–99)
Glucose-Capillary: 115 mg/dL — ABNORMAL HIGH (ref 70–99)
Glucose-Capillary: 123 mg/dL — ABNORMAL HIGH (ref 70–99)
Glucose-Capillary: 122 mg/dL — ABNORMAL HIGH (ref 70–99)

## 2024-02-03 LAB — LIPASE, BLOOD: Lipase: 39 U/L (ref 11–51)

## 2024-02-03 LAB — HCG, SERUM, QUALITATIVE: Preg, Serum: NEGATIVE

## 2024-02-03 LAB — TROPONIN I (HIGH SENSITIVITY)
Troponin I (High Sensitivity): 18 ng/L — ABNORMAL HIGH (ref <18)
Troponin I (High Sensitivity): 17 ng/L (ref <18)

## 2024-02-03 MED ORDER — SACUBITRIL-VALSARTAN 24-26 MG PO TABS: 1.0000 | ORAL_TABLET | Freq: Two times a day (BID) | ORAL | Status: DC

## 2024-02-03 MED ORDER — CARVEDILOL 25 MG PO TABS
25.0000 mg | ORAL_TABLET | Freq: Two times a day (BID) | ORAL | Status: DC
  Administered 2024-02-03 – 2024-02-07 (×6): 25 mg via ORAL
  Filled 2024-02-03: qty 2
  Filled 2024-02-03 (×5): qty 1

## 2024-02-03 MED ORDER — HEPARIN SODIUM (PORCINE) 5000 UNIT/ML IJ SOLN
5000.0000 [IU] | Freq: Three times a day (TID) | INTRAMUSCULAR | Status: DC
  Administered 2024-02-03: 5000 [IU] via SUBCUTANEOUS
  Filled 2024-02-03: qty 1

## 2024-02-03 MED ORDER — SODIUM BICARBONATE 650 MG PO TABS
650.0000 mg | ORAL_TABLET | Freq: Two times a day (BID) | ORAL | Status: DC
  Administered 2024-02-03 – 2024-02-07 (×7): 650 mg via ORAL
  Filled 2024-02-03 (×7): qty 1

## 2024-02-03 MED ORDER — ONDANSETRON HCL 4 MG PO TABS: 4.0000 mg | ORAL_TABLET | Freq: Once | ORAL | Status: DC

## 2024-02-03 MED ORDER — FENTANYL CITRATE PF 50 MCG/ML IJ SOSY
50.0000 ug | PREFILLED_SYRINGE | Freq: Once | INTRAMUSCULAR | Status: AC
  Administered 2024-02-03: 50 ug via INTRAVENOUS
  Filled 2024-02-03: qty 1

## 2024-02-03 MED ORDER — PANCRELIPASE (LIP-PROT-AMYL) 12000-38000 UNITS PO CPEP
12000.0000 [IU] | ORAL_CAPSULE | Freq: Three times a day (TID) | ORAL | Status: DC
  Administered 2024-02-04 – 2024-02-07 (×9): 12000 [IU] via ORAL
  Filled 2024-02-03 (×14): qty 1

## 2024-02-03 MED ORDER — OLANZAPINE 5 MG PO TBDP
5.0000 mg | ORAL_TABLET | Freq: Every day | ORAL | Status: DC
  Administered 2024-02-03 – 2024-02-06 (×4): 5 mg via ORAL
  Filled 2024-02-03 (×6): qty 1

## 2024-02-03 MED ORDER — CALCITRIOL 0.25 MCG PO CAPS
1.2500 ug | ORAL_CAPSULE | ORAL | Status: DC
  Administered 2024-02-03 – 2024-02-07 (×3): 1.25 ug via ORAL
  Filled 2024-02-03 (×3): qty 1

## 2024-02-03 MED ORDER — PANTOPRAZOLE SODIUM 40 MG IV SOLR
40.0000 mg | Freq: Two times a day (BID) | INTRAVENOUS | Status: DC
  Administered 2024-02-03: 40 mg via INTRAVENOUS
  Filled 2024-02-03: qty 10

## 2024-02-03 MED ORDER — SODIUM CHLORIDE 0.9% FLUSH: 3.0000 mL | INTRAVENOUS | Status: DC | PRN

## 2024-02-03 MED ORDER — DULOXETINE HCL 20 MG PO CPEP
20.0000 mg | ORAL_CAPSULE | Freq: Every day | ORAL | Status: DC
  Administered 2024-02-03 – 2024-02-06 (×3): 20 mg via ORAL
  Filled 2024-02-03 (×3): qty 1

## 2024-02-03 MED ORDER — ACETAMINOPHEN 325 MG PO TABS
650.0000 mg | ORAL_TABLET | Freq: Four times a day (QID) | ORAL | Status: DC | PRN
  Administered 2024-02-05 – 2024-02-07 (×4): 650 mg via ORAL
  Filled 2024-02-03 (×4): qty 2

## 2024-02-03 MED ORDER — SODIUM CHLORIDE 0.9 % IV SOLN: 250.0000 mL | INTRAVENOUS | Status: AC | PRN

## 2024-02-03 MED ORDER — ROSUVASTATIN CALCIUM 20 MG PO TABS
40.0000 mg | ORAL_TABLET | Freq: Every day | ORAL | Status: DC
  Administered 2024-02-03 – 2024-02-06 (×3): 40 mg via ORAL
  Filled 2024-02-03 (×3): qty 2

## 2024-02-03 MED ORDER — INSULIN REGULAR(HUMAN) IN NACL 100-0.9 UT/100ML-% IV SOLN
INTRAVENOUS | Status: DC
  Administered 2024-02-03: 5.5 [IU]/h via INTRAVENOUS
  Administered 2024-02-03: 0.4 [IU]/h via INTRAVENOUS
  Filled 2024-02-03: qty 100

## 2024-02-03 MED ORDER — HYDRALAZINE HCL 20 MG/ML IJ SOLN
10.0000 mg | Freq: Once | INTRAMUSCULAR | Status: AC
Start: 2024-02-03 — End: 2024-02-03
  Administered 2024-02-03: 10 mg via INTRAVENOUS
  Filled 2024-02-03: qty 1

## 2024-02-03 MED ORDER — SODIUM CHLORIDE 0.9% FLUSH
3.0000 mL | Freq: Two times a day (BID) | INTRAVENOUS | Status: DC
  Administered 2024-02-03 – 2024-02-06 (×5): 3 mL via INTRAVENOUS

## 2024-02-03 MED ORDER — ACETAMINOPHEN 650 MG RE SUPP: 650.0000 mg | Freq: Four times a day (QID) | RECTAL | Status: DC | PRN

## 2024-02-03 MED ORDER — ALBUTEROL SULFATE (2.5 MG/3ML) 0.083% IN NEBU
2.5000 mg | INHALATION_SOLUTION | RESPIRATORY_TRACT | Status: DC | PRN
  Administered 2024-02-05: 2.5 mg via RESPIRATORY_TRACT
  Filled 2024-02-03: qty 3

## 2024-02-03 MED ORDER — DEXTROSE 50 % IV SOLN: 0.0000 mL | INTRAVENOUS | Status: DC | PRN

## 2024-02-03 MED ORDER — MORPHINE SULFATE (PF) 2 MG/ML IV SOLN
2.0000 mg | INTRAVENOUS | Status: DC | PRN
  Administered 2024-02-03 – 2024-02-07 (×17): 2 mg via INTRAVENOUS
  Filled 2024-02-03 (×15): qty 1

## 2024-02-03 MED ORDER — SODIUM CHLORIDE 0.9% FLUSH
3.0000 mL | Freq: Two times a day (BID) | INTRAVENOUS | Status: DC
  Administered 2024-02-03 – 2024-02-04 (×3): 3 mL via INTRAVENOUS

## 2024-02-03 MED ORDER — DEXTROSE IN LACTATED RINGERS 5 % IV SOLN: INTRAVENOUS | Status: AC

## 2024-02-03 MED ORDER — ONDANSETRON HCL 4 MG/2ML IJ SOLN
4.0000 mg | Freq: Once | INTRAMUSCULAR | Status: AC
  Administered 2024-02-03: 4 mg via INTRAVENOUS
  Filled 2024-02-03: qty 2

## 2024-02-03 MED ORDER — BUMETANIDE 2 MG PO TABS
10.0000 mg | ORAL_TABLET | Freq: Every day | ORAL | Status: DC
  Administered 2024-02-03 – 2024-02-07 (×4): 10 mg via ORAL
  Filled 2024-02-03 (×6): qty 5

## 2024-02-03 MED ORDER — SODIUM ZIRCONIUM CYCLOSILICATE 10 G PO PACK
10.0000 g | PACK | Freq: Once | ORAL | Status: AC
  Administered 2024-02-03: 10 g via ORAL
  Filled 2024-02-03: qty 1

## 2024-02-03 MED ORDER — LAMOTRIGINE 100 MG PO TABS
200.0000 mg | ORAL_TABLET | Freq: Every day | ORAL | Status: DC
  Administered 2024-02-03 – 2024-02-06 (×3): 200 mg via ORAL
  Filled 2024-02-03: qty 2
  Filled 2024-02-03: qty 8
  Filled 2024-02-03: qty 2

## 2024-02-03 MED ORDER — HEPARIN SODIUM (PORCINE) 5000 UNIT/ML IJ SOLN
5000.0000 [IU] | Freq: Two times a day (BID) | INTRAMUSCULAR | Status: DC
  Administered 2024-02-03: 5000 [IU] via SUBCUTANEOUS
  Filled 2024-02-03 (×6): qty 1

## 2024-02-03 MED ORDER — CHLORHEXIDINE GLUCONATE CLOTH 2 % EX PADS: 6.0000 | MEDICATED_PAD | Freq: Every day | CUTANEOUS | Status: DC

## 2024-02-03 MED ORDER — PROCHLORPERAZINE MALEATE 5 MG PO TABS
5.0000 mg | ORAL_TABLET | Freq: Four times a day (QID) | ORAL | Status: DC | PRN
  Filled 2024-02-03: qty 1

## 2024-02-03 NOTE — ED Notes (Signed)
 Patient reporting 10/10 generalized pain. Patient requesting something for pain. Message sent to EDP.

## 2024-02-03 NOTE — ED Notes (Signed)
 Pt refused x-ray at this time.

## 2024-02-03 NOTE — H&P (Addendum)
 History and Physical    Ashley Freeman FMW:[id number] DOB: Jan 14, 1991 DOA: 02/03/2024 DOS: the patient was seen and examined on 02/03/2024 . PCP: Keven Crumbly Pap, MD  Patient coming from: Home Chief complaint: Chief Complaint  Patient presents with   Abdominal Pain   Chest Pain   HPI:  Ashley Freeman is a 33 y.o. female with past medical history  of  HTN, HLD, ESRD on HD (MWF), HFrEF, uncontrolled DM type I, bipolar disorder, and noncompliance with multiple admissions for DKA and/or fluid overload presents with multiple complaints of chest pain abdominal pain generalized weakness.  Similar to her previous presentations patient seen and left AMA on October 3.  ED Course:  Vital signs in the ED were notable for the following:  Vitals:   02/03/24 0959 02/03/24 1200 02/03/24 1355 02/03/24 1700  BP:  (!) 175/112 (!) 176/109 (!) 179/90  Pulse:  96 96 90  Temp: (!) 97.3 F (36.3 C)  (!) 97 F (36.1 C) 97.6 F (36.4 C)  Resp:  (!) 21 (!) 21 (!) 24  Height:      Weight:      SpO2:  98% 100% 100%  TempSrc: Oral  Axillary Oral  BMI (Calculated):       >>ED evaluation thus far shows: Venous blood gas today showing pO2 of 52 pH of 7.3 pCO2 of 40.5. Abnormal CMP with sodium 134 potassium 5.9 glucose 539 BUN 55 creatinine 8.2 normal anion gap, alk phos of 655 AST of 74 ALT of 66. Troponin of 18 repeat at 17. CBC showing leukocytosis of 13.7 hemoglobin 10.7 platelets of 327.   Chart review shows patient has had an endoscopy on 31 January 2024 for her nausea vomiting and dysphagia, showing LA grade D esophagitis with no bleeding, large amount of food residue in the stomach and a normal antrum normal duodenum and pylorus. EKG today shows sinus rhythm at 96, PR 154, QTc of 460, inferior leads showing T wave inversions. Patient had a CT abdomen and pelvis noncontrast today showing large ascites congestive heart failure hepatomegaly. Chest x-ray done today  shows low lung volumes stable cardiomegaly and bibasilar atelectasis and central vascular congestion.  Most recent echo in may 2025 shows: 1. Left ventricular ejection fraction, by estimation, is 30 to 35%. Left  ventricular ejection fraction by 3D volume is 34 %. Left ventricular  ejection fraction by 2D MOD biplane is 29.6 %. The left ventricle has  moderately decreased function. The left  ventricle demonstrates global hypokinesis. Left ventricular diastolic  parameters are consistent with Grade II diastolic dysfunction  (pseudonormalization).   2. Right ventricular systolic function is normal. The right ventricular  size is mildly enlarged. There is normal pulmonary artery systolic  pressure. The estimated right ventricular systolic pressure is 34.4 mmHg.   3. Left atrial size was mildly dilated.   4. Right atrial size was mildly dilated.   5. The mitral valve is normal in structure. No evidence of mitral valve  regurgitation. No evidence of mitral stenosis.   6. The tricuspid valve is abnormal. Tricuspid valve regurgitation is  severe.   7. The aortic valve is tricuspid. Aortic valve regurgitation is not  visualized. No aortic stenosis is present.   8. The inferior vena cava is normal in size with <50% respiratory  variability, suggesting right atrial pressure of 8 mmHg.   9. A small pericardial effusion is present. The pericardial effusion is  circumferential.   >>While in the ED patient received  the following: Medications  insulin  regular, human (MYXREDLIN ) 100 units/ 100 mL infusion (has no administration in time range)  dextrose  50 % solution 0-50 mL (has no administration in time range)  dextrose  5 % in lactated ringers  infusion (has no administration in time range)  fentaNYL  (SUBLIMAZE ) injection 50 mcg (50 mcg Intravenous Given 02/03/24 1116)  fentaNYL  (SUBLIMAZE ) injection 50 mcg (50 mcg Intravenous Given 02/03/24 1351)  sodium zirconium cyclosilicate  (LOKELMA ) packet 10 g  (10 g Oral Given 02/03/24 1352)   Review of Systems  Cardiovascular:  Positive for chest pain.  Gastrointestinal:  Positive for abdominal pain.   Past Medical History:  Diagnosis Date   Abscess, gluteal, right 08/24/2013   Anemia 02/19/2012   Bartholin's gland abscess 09/19/2013   Bipolar disorder (HCC)    BV (bacterial vaginosis) 11/24/2015   Depression    Diabetes mellitus type I (HCC) 2001   Diagnosed at age 44 ; Type I   Diarrhea 05/30/2016   DKA (diabetic ketoacidoses) 08/19/2013   Also in 2018   ESRD (end stage renal disease) (HCC)    Gonorrhea 08/2011   Treated in 09/2011   HFrEF (heart failure with reduced ejection fraction) (HCC)    a. 2022 Echo: EF 40%; b. 10/2021 Echo: EF 55%; b. 07/2022 MV: No ischemia. EF 31%; c. 08/2022 Echo: EF 35%, mildly dil RV, sev TR.   History of trichomoniasis 05/31/2016   Hyperlipidemia 03/28/2016   Hypertension    NICM (nonischemic cardiomyopathy) (HCC)    Sepsis (HCC) 09/19/2013   Past Surgical History:  Procedure Laterality Date   A/V FISTULAGRAM Right 06/17/2023   Procedure: A/V Fistulagram;  Surgeon: Marea Selinda RAMAN, MD;  Location: [facility] INVASIVE CV LAB;  Service: Cardiovascular;  Laterality: Right;   A/V SHUNT INTERVENTION N/A 09/25/2023   Procedure: A/V SHUNT INTERVENTION;  Surgeon: Pearline Norman RAMAN, MD;  Location: [facility] PV LAB;  Service: Cardiovascular;  Laterality: N/A;   AV FISTULA PLACEMENT Right 07/06/2022   Procedure: ARTERIOVENOUS GRAFT CREATION;  Surgeon: Gretta Lonni PARAS, MD;  Location: [facility] OR;  Service: Vascular;  Laterality: Right;   CESAREAN SECTION N/A 10/05/2019   Procedure: CESAREAN SECTION;  Surgeon: Izell Harari, MD;  Location: [facility] LD ORS;  Service: Obstetrics;  Laterality: N/A;   CHOLECYSTECTOMY N/A 07/02/2023   Procedure: LAPAROSCOPIC CHOLECYSTECTOMY;  Surgeon: Ebbie Cough, MD;  Location[facility] OR;  Service: General;  Laterality: N/A;   ESOPHAGOGASTRODUODENOSCOPY N/A 01/31/2024   Procedure: EGD  (ESOPHAGOGASTRODUODENOSCOPY);  Surgeon: San Sandor GAILS, DO;  Location: [facility] ENDOSCOPY;  Service: Gastroenterology;  Laterality: N/A;   INCISION AND DRAINAGE ABSCESS Left 09/28/2019   Procedure: INCISION AND DRAINAGE VULVAR ABCESS;  Surgeon: Edsel Norleen GAILS, MD;  Location: [facility] OR;  Service: Gynecology;  Laterality: Left;   INCISION AND DRAINAGE PERIRECTAL ABSCESS Right 08/18/2013   Procedure: IRRIGATION AND DEBRIDEMENT GLUTEAL ABSCESS;  Surgeon: Lynda Leos, MD;  Location: [facility] OR;  Service: General;  Laterality: Right;   INCISION AND DRAINAGE PERIRECTAL ABSCESS Right 09/19/2013   Procedure: IRRIGATION AND DEBRIDEMENT RIGHT GLUTEAL AND LABIAL ABSCESSES;  Surgeon: Lynda Leos, MD;  Location: [facility] OR;  Service: General;  Laterality: Right;   INCISION AND DRAINAGE PERIRECTAL ABSCESS Right 09/24/2013   Procedure: IRRIGATION AND DEBRIDEMENT PERIRECTAL ABSCESS;  Surgeon: Lynwood MALVA Pina, MD;  Location: [facility] OR;  Service: General;  Laterality: Right;   IR PARACENTESIS  08/28/2023   IR PARACENTESIS  11/04/2023   IR PARACENTESIS  12/23/2023    reports that she has never smoked. She has never been exposed  to tobacco smoke. She has never used smokeless tobacco. She reports that she does not currently use alcohol. She reports that she does not use drugs. Allergies  Allergen Reactions   Keflex  [Cephalexin ] Anaphylaxis    Ceftriaxone  in the past with no reaction   Penicillins Anaphylaxis, Hives and Rash   Vibramycin  [Doxycycline ] Anaphylaxis   Benadryl  [Diphenhydramine ] Itching   Dilaudid  [Hydromorphone ] Itching   Methotrexate Derivatives Rash   Roxicodone  [Oxycodone ] Itching    Takes Percocet without issue   Family History  Problem Relation Age of Onset   Asthma Mother    Carpal tunnel syndrome Mother    Gout Father    Diabetes Paternal Grandmother    Anesthesia problems Neg Hx    Prior to Admission medications   Medication Sig Start Date End Date Taking? Authorizing Provider  albuterol  (VENTOLIN   HFA) 108 (90 Base) MCG/ACT inhaler Inhale 2 puffs into the lungs every 4 (four) hours as needed for wheezing or shortness of breath. 11/24/22   [provider]  bumetanide  (BUMEX ) 2 MG tablet Take 10 mg by mouth daily.    [provider]  calcitRIOL  (ROCALTROL ) 0.25 MCG capsule Take 5 capsules (1.25 mcg total) by mouth every Tuesday, Thursday, and Saturday at 6 PM. Patient taking differently: Take 1.25 mcg by mouth every Monday, Wednesday, and Friday. 07/09/23   Cindy Garnette POUR, MD  carvedilol  (COREG ) 25 MG tablet Take 25 mg by mouth 2 (two) times daily with a meal.    [provider]  DULoxetine  (CYMBALTA ) 20 MG capsule Take 20 mg by mouth daily. 07/29/23   [provider]  famotidine  (PEPCID ) 20 MG tablet Take 20 mg by mouth daily. 07/29/23   [provider]  fluticasone  (FLONASE ) 50 MCG/ACT nasal spray Place 2 sprays into both nostrils daily. 12/11/23 12/10/24  [provider]  hydrOXYzine  (ATARAX ) 25 MG tablet Take 25 mg by mouth 3 (three) times daily as needed for anxiety, itching, nausea or vomiting.    [provider]  insulin  aspart (NOVOLOG ) 100 UNIT/ML injection Inject 5 Units into the skin 3 (three) times daily with meals.    [provider]  ketoconazole  (NIZORAL ) 2 % cream Apply 1 Application topically 2 (two) times daily. Apply 1gm twice daily to affected skin on feet 01/06/24   McCaughan, Dia D, DPM  lamoTRIgine  (LAMICTAL ) 200 MG tablet Take 1 tablet (200 mg total) by mouth daily. 10/29/23   Regalado, Belkys A, MD  LANTUS  SOLOSTAR 100 UNIT/ML Solostar Pen Inject 10 Units into the skin daily.    [provider]  NOVOLOG  FLEXPEN 100 UNIT/ML FlexPen INJECT 5 UNITS UNDER THE SKIN 3 TIMES A DAY BEFORE MEALS. IF YOU DO NOT EAT A MEAL, PLEASE CHEECK YOUR BLOOD SUGAR AND GIVE YOURSELF 1 UNIT OF INSULIN  FOR EVERY 50MG /DL GREATER THAN 869. MAX 30 UNITS PER DAY. 01/18/24   [provider]  OLANZapine  zydis (ZYPREXA ) 5 MG  disintegrating tablet Take 5 mg by mouth at bedtime. 09/23/23 02/08/24  [provider]  omeprazole  (PRILOSEC) 40 MG capsule Take 40 mg by mouth daily as needed (for acid refulx). 12/11/23 02/09/24  [provider]  ondansetron  (ZOFRAN -ODT) 4 MG disintegrating tablet Take 1 tablet (4 mg total) by mouth every 8 (eight) hours as needed for nausea or vomiting. 06/23/23   Davis, Jonathon H, MD  oxyCODONE -acetaminophen  (PERCOCET/ROXICET) 5-325 MG tablet Take 1 tablet by mouth every 4 (four) hours as needed for moderate pain (pain score 4-6). 01/20/24   [provider]  Pancrelipase , Lip-Prot-Amyl, 3000-9500 units CPEP Take 3,000 units of lipase by mouth 3 (three) times daily before meals. 09/23/23 02/08/24  [provider]  prochlorperazine  (COMPAZINE ) 5 MG tablet Take 5 mg by mouth every 6 (six) hours as needed for nausea or vomiting.    [provider]  rosuvastatin  (CRESTOR ) 40 MG tablet Take 40 mg by mouth daily. 01/20/24 01/19/25  [provider]  sacubitril-valsartan (ENTRESTO) 24-26 MG Take 1 tablet by mouth 2 (two) times daily.    [provider]  sodium bicarbonate  650 MG tablet Take 650 mg by mouth 2 (two) times daily. 07/29/23   [provider]  SUMAtriptan  (IMITREX ) 50 MG tablet Take 50 mg by mouth every 2 (two) hours as needed for migraine or headache. 06/20/22   [provider]                                                                                 Vitals:   02/03/24 0959 02/03/24 1200 02/03/24 1355 02/03/24 1700  BP:  (!) 175/112 (!) 176/109 (!) 179/90  Pulse:  96 96 90  Resp:  (!) 21 (!) 21 (!) 24  Temp: (!) 97.3 F (36.3 C)  (!) 97 F (36.1 C) 97.6 F (36.4 C)  TempSrc: Oral  Axillary Oral  SpO2:  98% 100% 100%  Weight:      Height:       Physical Exam Vitals and nursing note reviewed.  Constitutional:      General: She is not in acute distress. HENT:     Head: Normocephalic and atraumatic.      Right Ear: Hearing normal.     Left Ear: Hearing normal.     Nose: No nasal deformity.     Mouth/Throat:     Lips: Pink.  Eyes:     General: Lids are normal.     Extraocular Movements: Extraocular movements intact.  Cardiovascular:     Rate and Rhythm: Normal rate and regular rhythm.     Heart sounds: Normal heart sounds.  Pulmonary:     Effort: Pulmonary effort is normal.     Breath sounds: Normal breath sounds.  Abdominal:     General: Bowel sounds are normal. There is distension.     Palpations: Abdomen is soft. There is fluid wave. There is no mass.     Tenderness: There is abdominal tenderness.  Musculoskeletal:     Right lower leg: No edema.     Left lower leg: No edema.  Skin:    General: Skin is warm.  Neurological:     General: No focal deficit present.     Mental Status: She is alert and oriented to person, place, and time.     Cranial Nerves: Cranial nerves 2-12 are intact.  Psychiatric:        Speech: Speech normal.     Labs on Admission: I have personally reviewed following labs and imaging studies CBC: Recent Labs  Lab 01/28/24 2329 01/29/24 1302 01/30/24 0258 01/31/24 0809 02/03/24 1100 02/03/24 1113  WBC 16.2* 11.3* 13.1* 11.4* 13.7*  --   NEUTROABS  --  7.4  --   --  7.6  --  HGB 10.1* 10.3* 9.9* 10.3* 10.7* 11.9*  HCT 33.0* 34.0* 32.6* 34.2* 35.6* 35.0*  MCV 87.5 90.4 89.6 90.0 89.4  --   PLT 258 180 214 216 327  --    Basic Metabolic Panel: Recent Labs  Lab 01/28/24 0520 01/28/24 1120 01/28/24 2329 01/29/24 0746 01/29/24 1302 01/30/24 0258 01/31/24 0809 02/03/24 1100 02/03/24 1113  NA 139   < > 136 136 134* 135 133* 134* 132*  K 3.7   < > 4.1 4.2 4.7 4.5 4.7 5.9* 5.8*  CL 99   < > 96* 91* 95* 98 97* 99  --   CO2 25   < > 26 27 21* 25 24 22   --   GLUCOSE 97   < > 101* 142* 164* 158* 193* 539*  --   BUN 61*   < > 33* 34* 35* 41* 30* 55*  --   CREATININE 8.20*   < > 5.64* 5.78* 6.05* 6.61* 5.43* 8.20*  --   CALCIUM  8.2*   < > 8.2*  8.6* 8.1* 8.1* 8.0* 8.7*  --   MG 2.7*  --  2.2  --   --  2.5* 2.7*  --   --   PHOS 7.9*  --   --   --   --   --   --   --   --    < > = values in this interval not displayed.   GFR: Estimated Creatinine Clearance: 8.2 mL/min (A) (by C-G formula based on SCr of 8.2 mg/dL (H)). Liver Function Tests: Recent Labs  Lab 01/29/24 1302 01/30/24 0258 01/31/24 0809 02/03/24 1100  AST 35 50* 33 74*  ALT 20 24 24  66*  ALKPHOS 493* 469* 507* 655*  BILITOT 1.0 0.6 0.8 0.9  PROT 4.6* 4.5* 5.0* 6.3*  ALBUMIN  2.4* 2.2* 2.5* 3.2*   Recent Labs  Lab 02/03/24 1100  LIPASE 39   No results for input(s): AMMONIA in the last 168 hours. Recent Labs    01/27/24 1948 01/28/24 0520 01/28/24 1120 01/28/24 1911 01/28/24 2329 01/29/24 0746 01/29/24 1302 01/30/24 0258 01/31/24 0809 02/03/24 1100  BUN 60* 61* 29* 32* 33* 34* 35* 41* 30* 55*  CREATININE 8.12* 8.20* 4.98* 5.60* 5.64* 5.78* 6.05* 6.61* 5.43* 8.20*    Cardiac Enzymes: No results for input(s): CKTOTAL, CKMB, CKMBINDEX, TROPONINI in the last 168 hours. BNP (last 3 results) No results for input(s): PROBNP in the last 8760 hours. HbA1C: No results for input(s): HGBA1C in the last 72 hours. CBG: Recent Labs  Lab 02/03/24 1429 02/03/24 1501 02/03/24 1550 02/03/24 1656 02/03/24 1802  GLUCAP 536* 541* 432* 366* 230*   Lipid Profile: No results for input(s): CHOL, HDL, LDLCALC, TRIG, CHOLHDL, LDLDIRECT in the last 72 hours. Thyroid  Function Tests: No results for input(s): TSH, T4TOTAL, FREET4, T3FREE, THYROIDAB in the last 72 hours. Anemia Panel: No results for input(s): VITAMINB12, FOLATE, FERRITIN, TIBC, IRON , RETICCTPCT in the last 72 hours. Urine analysis:    Component Value Date/Time   COLORURINE YELLOW 10/25/2023 2300   APPEARANCEUR CLOUDY (A) 10/25/2023 2300   APPEARANCEUR Hazy 11/10/2013 2043   LABSPEC 1.016 10/25/2023 2300   LABSPEC 1.031 11/10/2013 2043   PHURINE  6.0 10/25/2023 2300   GLUCOSEU >=500 (A) 10/25/2023 2300   GLUCOSEU >=500 11/10/2013 2043   HGBUR SMALL (A) 10/25/2023 2300   BILIRUBINUR NEGATIVE 10/25/2023 2300   BILIRUBINUR negative 06/04/2018 1035   BILIRUBINUR Negative 11/24/2015 1443   BILIRUBINUR Negative 11/10/2013 2043   KETONESUR 5 (  A) 10/25/2023 2300   PROTEINUR >=300 (A) 10/25/2023 2300   UROBILINOGEN 0.2 09/14/2019 1732   NITRITE NEGATIVE 10/25/2023 2300   LEUKOCYTESUR NEGATIVE 10/25/2023 2300   LEUKOCYTESUR 1+ 11/10/2013 2043   Radiological Exams on Admission: CT ABDOMEN PELVIS WO CONTRAST Result Date: 02/03/2024 CLINICAL DATA:  Abdominal pain. EXAM: CT ABDOMEN AND PELVIS WITHOUT CONTRAST TECHNIQUE: Multidetector CT imaging of the abdomen and pelvis was performed following the standard protocol without IV contrast. RADIATION DOSE REDUCTION: This exam was performed according to the departmental dose-optimization program which includes automated exposure control, adjustment of the mA and/or kV according to patient size and/or use of iterative reconstruction technique. COMPARISON:  01/15/2024. FINDINGS: Lower chest: Septal thickening and ground-glass in the lung bases. Small to moderate right pleural effusion. Heart is enlarged. Trace pericardial fluid may be physiologic. Distal esophagus is grossly unremarkable. Hepatobiliary: Liver is enlarged, 21.4 cm. Liver is otherwise grossly unremarkable. Cholecystectomy. No biliary ductal dilatation. Pancreas: Grossly unremarkable. Spleen: Negative. Adrenals/Urinary Tract: Adrenal glands and kidneys are grossly unremarkable. Ureters are decompressed. Bladder is grossly unremarkable. Stomach/Bowel: Stomach, small bowel, appendix and colon are unremarkable. Vascular/Lymphatic: Vascular structures are unremarkable. Scattered abdominal peritoneal ligament and retroperitoneal lymph nodes are not enlarged by CT size criteria. Reproductive: Uterus is visualized.  No adnexal mass. Other: Large ascites.   Omental congestion/edema. Musculoskeletal: None. IMPRESSION: 1. Large ascites. 2. Congestive heart failure. 3. Hepatomegaly. Electronically Signed   By: Newell Eke M.D.   On: 02/03/2024 13:50   DG Chest 2 View Result Date: 02/03/2024 CLINICAL DATA:  Chest and abdominal pain. EXAM: CHEST - 2 VIEW COMPARISON:  Radiograph 01/26/2024 FINDINGS: Low lung volumes. Cardiomegaly is stable. Unchanged mediastinal contours. Central vascular congestion. Mild atelectasis at both lung bases, left greater than right. No confluent opacity. No pleural effusion or pneumothorax. Vascular stent in the right proximal arm. No acute osseous findings. IMPRESSION: 1. Low lung volumes with mild bibasilar atelectasis. 2. Stable cardiomegaly. Central vascular congestion. Electronically Signed   By: Andrea Gasman M.D.   On: 02/03/2024 12:19   Data Reviewed: Relevant notes from primary care and specialist visits, past discharge summaries as available in EHR, including Care Everywhere . Prior diagnostic testing as pertinent to current admission diagnoses, Updated medications and problem lists for reconciliation .ED course, including vitals, labs, imaging, treatment and response to treatment,Triage notes, nursing and pharmacy notes and ED provider's notes.Notable results as noted in HPI.Discussed case with EDMD/ ED APP/ or Specialty MD on call and as needed.  Assessment & Plan  33 year old diabetic noncompliant with end-stage renal disease on hemodialysis presenting today with abdominal pain and chest pain.  Will admit to med/tele unit and follow.  Nephrology has already been consulted. >>Abdominal pain: Secondary to large ascites, will consult IR for paracentesis .  As needed pain control with fentanyl .  Strict I's and O's.  Of note most recent ultrasound of the abdomen in March 2025 showed small volume perihepatic ascites and cholelithiasis.   >>Chest pain: Troponins are negative, EKG is showing mild T wave inversions in  inferior leads which we will follow along with the troponins. Patient has had upper GI evaluation that has showed esophagitis.  Will obtain a V/Q scan for her chest discomfort to identify any pulmonary embolism.  >> Hyperkalemia: Patient on dextrose , insulin , lokelma .   >> Diabetes mellitus type 1: Hyperglycemia, in the ED patient started on insulin  drip, will manage patient on every 4 hours sliding scale and every 4 hours Accu-Cheks.  Add on A1c.  >>  End-stage renal disease on Monday Wednesday Friday hemodialysis: Nephrology has been consulted for patient to be dialyzed.  Continue calcitriol .  >> Anemia: Hemoglobin stable at 11.9 and will follow.  Type and screen.  >> Acute on chronic chronic HFrEF: Volume management with hemodialysis, patient currently has a rales and ascites.  Continue Entresto.  >> Essential hypertension: Vitals:   02/03/24 0955 02/03/24 1200 02/03/24 1355 02/03/24 1700  BP: (!) 169/113 (!) 175/112 (!) 176/109 (!) 179/90  Will continue patient on Coreg    DVT prophylaxis:  Heparin .  Consults:  Nephrology  Advance Care Planning:    Code Status: Full Code   Family Communication:  None Disposition Plan:  Home Severity of Illness: The appropriate patient status for this patient is INPATIENT. Inpatient status is judged to be reasonable and necessary in order to provide the required intensity of service to ensure the patient's safety. The patient's presenting symptoms, physical exam findings, and initial radiographic and laboratory data in the context of their chronic comorbidities is felt to place them at high risk for further clinical deterioration. Furthermore, it is not anticipated that the patient will be medically stable for discharge from the hospital within 2 midnights of admission.   * I certify that at the point of admission it is my clinical judgment that the patient will require inpatient hospital care spanning beyond 2 midnights from the point of  admission due to high intensity of service, high risk for further deterioration and high frequency of surveillance required.*  Unresulted Labs (From admission, onward)     Start     Ordered   02/04/24 0500  Comprehensive metabolic panel  Tomorrow morning,   R        02/03/24 1423   02/04/24 0500  CBC  Tomorrow morning,   R        02/03/24 1423   02/04/24 0500  Protime-INR  Tomorrow morning,   R        02/03/24 1843   02/03/24 1424  Magnesium   Add-on,   AD        02/03/24 1423   02/03/24 1008  Urinalysis, Routine w reflex microscopic -Urine, Clean Catch  Once,   URGENT       Question:  Specimen Source  Answer:  Urine, Clean Catch   02/03/24 1008            Meds ordered this encounter  Medications   fentaNYL  (SUBLIMAZE ) injection 50 mcg   fentaNYL  (SUBLIMAZE ) injection 50 mcg   sodium zirconium cyclosilicate  (LOKELMA ) packet 10 g   insulin  regular, human (MYXREDLIN ) 100 units/ 100 mL infusion    EndoTool Goal Range::   140-180    Type of Diabetes:   Type 1    Mode of Therapy:   Hyperglycemia    Start Method:   EndoTool to calculate   dextrose  50 % solution 0-50 mL   dextrose  5 % in lactated ringers  infusion   albuterol  (PROVENTIL ) (2.5 MG/3ML) 0.083% nebulizer solution 2.5 mg   bumetanide  (BUMEX ) tablet 10 mg   calcitRIOL  (ROCALTROL ) capsule 1.25 mcg   carvedilol  (COREG ) tablet 25 mg   DULoxetine  (CYMBALTA ) DR capsule 20 mg   lamoTRIgine  (LAMICTAL ) tablet 200 mg   OLANZapine  zydis (ZYPREXA ) disintegrating tablet 5 mg   sodium bicarbonate  tablet 650 mg   DISCONTD: sacubitril-valsartan (ENTRESTO) 24-26 mg per tablet    ACE-inhibitors have NOT been administered in the past 36-hours.:   NOT APPLICABLE (continuing home med)   rosuvastatin  (CRESTOR ) tablet 40 mg  prochlorperazine  (COMPAZINE ) tablet 5 mg   lipase/protease/amylase (CREON ) capsule 12,000 Units   hydrALAZINE  (APRESOLINE ) injection 10 mg   DISCONTD: heparin  injection 5,000 Units   sodium chloride  flush (NS) 0.9  % injection 3 mL   OR Linked Order Group    acetaminophen  (TYLENOL ) tablet 650 mg    acetaminophen  (TYLENOL ) suppository 650 mg   morphine  (PF) 2 MG/ML injection 2 mg   sodium chloride  flush (NS) 0.9 % injection 3 mL   sodium chloride  flush (NS) 0.9 % injection 3 mL   0.9 %  sodium chloride  infusion   Chlorhexidine  Gluconate Cloth 2 % PADS 6 each   DISCONTD: ondansetron  (ZOFRAN ) tablet 4 mg   heparin  injection 5,000 Units   pantoprazole  (PROTONIX ) injection 40 mg   ondansetron  (ZOFRAN ) injection 4 mg     Orders Placed This Encounter  Procedures   Critical Care   DG Chest 2 View   CT ABDOMEN PELVIS WO CONTRAST   IR Radiologist Eval & Mgmt   hCG, serum, qualitative   Lipase, blood   Comprehensive metabolic panel   CBC with Differential   Urinalysis, Routine w reflex microscopic -Urine, Clean Catch   Comprehensive metabolic panel   CBC   Magnesium    Protime-INR   Diet renal/carb modified with fluid restriction Fluid restriction: 1200 mL Fluid; Room service appropriate? Yes; Fluid consistency: Thin   ED Cardiac monitoring   ED Cardiac monitoring   Initiate Carrier Fluid Protocol   Notify physician (specify)   If present, discontinue Insulin  Pump after IV Insulin  is initiated.   Do NOT use lab glucose values in EndoTool.  If CBG meter reads Critical High, enter 600.   Maintain IV access   Vital signs   Notify physician (specify)   Mobility Protocol: No Restrictions   Refer to Sidebar Report Mobility Protocol for Adult Inpatient   Initiate Adult Central Line Maintenance and Catheter Protocol for patients with central line (CVC, PICC, Port, Hemodialysis, Trialysis)   Daily weights   Intake and Output   Initiate CHG Protocol   Do not place and if present remove PureWick   Initiate Oral Care Protocol   Initiate Carrier Fluid Protocol   RN may order General Admission PRN Orders utilizing General Admission PRN medications (through manage orders) for the following patient  needs: allergy symptoms (Claritin ), cold sores (Carmex), cough (Robitussin DM), eye irritation (Liquifilm Tears), hemorrhoids (Tucks), indigestion (Maalox), minor skin irritation (Hydrocortisone  Cream), muscle pain (Ben Gay), nose irritation (saline nasal spray) and sore throat (Chloraseptic spray).   Cardiac Monitoring - Continuous Indefinite   Informed Consent Details: Physician/Practitioner Attestation; Transcribe to consent form and obtain patient signature   Pre-Hemodialysis Protocol - Day of Dialysis   Post-Dialysis Protocol - Day of Dialysis   Full code   Consult to nephrology   Consult to hospitalist   ED Pulse oximetry, continuous   Pulse oximetry check with vital signs   Oxygen therapy Mode or (Route): Nasal cannula; Liters Per Minute: 2; Keep O2 saturation between: greater than 92 %   I-Stat venous blood gas, ED   CBG monitoring, ED   CBG monitoring, ED   CBG monitoring, ED   CBG monitoring, ED   CBG monitoring, ED   CBG monitoring, ED   CBG monitoring, ED   CBG monitoring, ED   ED EKG   ED EKG   EKG 12-Lead   Saline lock IV   Insert peripheral IV   Hemodialysis inpatient   Admit to Inpatient (patient's expected length  of stay will be greater than 2 midnights or inpatient only procedure)   Aspiration precautions   Fall precautions    Author: Mario LULLA Blanch, MD 12 pm- 8 pm. [facility]. 02/03/2024 6:43 PM Please note for any communication after hours contact [facility] Assigned provider on call on Amion.

## 2024-02-03 NOTE — ED Notes (Signed)
 IV team at bedside

## 2024-02-03 NOTE — ED Triage Notes (Signed)
 Patient arrives via [facility] for chest and abdominal pain. Patient just dc with DKA. Patient endorses chest and abdominal pain today that has worsened since recent discharge. Off scheduled dialysis, last tx Thursday and endorses she could not get in today as they were full. 12 lead sinus rhythm. Alert and oriented x4.   170/110 98 on room air 94 HR CBG 577

## 2024-02-03 NOTE — ED Notes (Addendum)
 Assuming pt care, pt bib ems coming from home for abd pain and chest discomfort x 1 day, dc recently for DKA. Pt aaox4, lethargic, denies pain/discomfort, resting in bed. Unable to provide urine sample at this time, Call bell within reach.

## 2024-02-03 NOTE — ED Provider Notes (Signed)
 [facility] Provider Note   CSN: [id number] Arrival date & time: 02/03/24  9050     Patient presents with: Abdominal Pain and Chest Pain   Ashley Freeman is a 33 y.o. female.   HPI 33 year old female presents with chest and abdominal pain.  Patient states that the pain has been going on since she left the hospital (left AMA on 10/3) but then the pain acutely worsened around 8 AM.  She was unable to sleep much last night due to the pain.  She denies any new cough but feels short of breath, has chest pain, and diffuse abdominal pain.  No leg swelling.  Her last dialysis was on 10/2.  Prior to Admission medications   Medication Sig Start Date End Date Taking? Authorizing Provider  albuterol  (VENTOLIN  HFA) 108 (90 Base) MCG/ACT inhaler Inhale 2 puffs into the lungs every 4 (four) hours as needed for wheezing or shortness of breath. 11/24/22   [provider]  bumetanide  (BUMEX ) 2 MG tablet Take 10 mg by mouth daily.    [provider]  calcitRIOL  (ROCALTROL ) 0.25 MCG capsule Take 5 capsules (1.25 mcg total) by mouth every Tuesday, Thursday, and Saturday at 6 PM. Patient taking differently: Take 1.25 mcg by mouth every Monday, Wednesday, and Friday. 07/09/23   Cindy Garnette POUR, MD  carvedilol  (COREG ) 25 MG tablet Take 25 mg by mouth 2 (two) times daily with a meal.    [provider]  DULoxetine  (CYMBALTA ) 20 MG capsule Take 20 mg by mouth daily. 07/29/23   [provider]  famotidine  (PEPCID ) 20 MG tablet Take 20 mg by mouth daily. 07/29/23   [provider]  fluticasone  (FLONASE ) 50 MCG/ACT nasal spray Place 2 sprays into both nostrils daily. 12/11/23 12/10/24  [provider]  hydrOXYzine  (ATARAX ) 25 MG tablet Take 25 mg by mouth 3 (three) times daily as needed for anxiety, itching, nausea or vomiting.    [provider]  insulin  aspart (NOVOLOG ) 100 UNIT/ML injection Inject 5  Units into the skin 3 (three) times daily with meals.    [provider]  ketoconazole  (NIZORAL ) 2 % cream Apply 1 Application topically 2 (two) times daily. Apply 1gm twice daily to affected skin on feet 01/06/24   McCaughan, Dia D, DPM  lamoTRIgine  (LAMICTAL ) 200 MG tablet Take 1 tablet (200 mg total) by mouth daily. 10/29/23   Regalado, Belkys A, MD  LANTUS  SOLOSTAR 100 UNIT/ML Solostar Pen Inject 10 Units into the skin daily.    [provider]  NOVOLOG  FLEXPEN 100 UNIT/ML FlexPen INJECT 5 UNITS UNDER THE SKIN 3 TIMES A DAY BEFORE MEALS. IF YOU DO NOT EAT A MEAL, PLEASE CHEECK YOUR BLOOD SUGAR AND GIVE YOURSELF 1 UNIT OF INSULIN  FOR EVERY 50MG /DL GREATER THAN 869. MAX 30 UNITS PER DAY. 01/18/24   [provider]  OLANZapine  zydis (ZYPREXA ) 5 MG disintegrating tablet Take 5 mg by mouth at bedtime. 09/23/23 02/08/24  [provider]  omeprazole  (PRILOSEC) 40 MG capsule Take 40 mg by mouth daily as needed (for acid refulx). 12/11/23 02/09/24  [provider]  ondansetron  (ZOFRAN -ODT) 4 MG disintegrating tablet Take 1 tablet (4 mg total) by mouth every 8 (eight) hours as needed for nausea or vomiting. 06/23/23   Davis, Jonathon H, MD  oxyCODONE -acetaminophen  (PERCOCET/ROXICET) 5-325 MG tablet Take 1 tablet by mouth every 4 (four) hours as needed for moderate pain (pain score 4-6). 01/20/24   [provider]  Pancrelipase , Lip-Prot-Amyl, 3000-9500 units CPEP Take 3,000 units of lipase by mouth 3 (three) times daily before meals. 09/23/23 02/08/24  [provider]  prochlorperazine  (COMPAZINE ) 5 MG tablet Take 5 mg by mouth every 6 (six) hours as needed for nausea or vomiting.    [provider]  rosuvastatin  (CRESTOR ) 40 MG tablet Take 40 mg by mouth daily. 01/20/24 01/19/25  [provider]  sacubitril-valsartan (ENTRESTO) 24-26 MG Take 1 tablet by mouth 2 (two) times daily.    [provider]  sodium bicarbonate  650 MG tablet  Take 650 mg by mouth 2 (two) times daily. 07/29/23   [provider]  SUMAtriptan  (IMITREX ) 50 MG tablet Take 50 mg by mouth every 2 (two) hours as needed for migraine or headache. 06/20/22   [provider]    Allergies: Keflex  [cephalexin ], Penicillins, Vibramycin  [doxycycline ], Benadryl  [diphenhydramine ], Dilaudid  [hydromorphone ], Methotrexate derivatives, and Roxicodone  [oxycodone ]    Review of Systems  Constitutional:  Negative for fever.  Respiratory:  Positive for shortness of breath.   Cardiovascular:  Positive for chest pain. Negative for leg swelling.  Gastrointestinal:  Positive for abdominal pain.    Updated Vital Signs BP (!) 176/109 (BP Location: Left Arm)   Pulse 96   Temp (!) 97 F (36.1 C) (Axillary)   Resp (!) 21   Ht 5' 3 (1.6 m)   Wt 60.3 kg   SpO2 100%   BMI 23.56 kg/m   Physical Exam Vitals and nursing note reviewed.  Constitutional:      General: She is not in acute distress.    Appearance: She is well-developed. She is not ill-appearing or diaphoretic.  HENT:     Head: Normocephalic and atraumatic.  Cardiovascular:     Rate and Rhythm: Normal rate and regular rhythm.     Heart sounds: Normal heart sounds.  Pulmonary:     Effort: Pulmonary effort is normal.     Breath sounds: Normal breath sounds.  Abdominal:     Palpations: Abdomen is soft.     Tenderness: There is generalized abdominal tenderness.  Musculoskeletal:     Right lower leg: No edema.     Left lower leg: No edema.  Skin:    General: Skin is warm and dry.  Neurological:     Mental Status: She is alert.     (all labs ordered are listed, but only abnormal results are displayed) Labs Reviewed  COMPREHENSIVE METABOLIC PANEL WITH GFR - Abnormal; Notable for the following components:      Result Value   Sodium 134 (*)    Potassium 5.9 (*)    Glucose, Bld 539 (*)    BUN 55 (*)    Creatinine, Ser 8.20 (*)    Calcium  8.7 (*)    Total Protein 6.3 (*)    Albumin   3.2 (*)    AST 74 (*)    ALT 66 (*)    Alkaline Phosphatase 655 (*)    GFR, Estimated 6 (*)    All other components within normal limits  CBC WITH DIFFERENTIAL/PLATELET - Abnormal; Notable for the following components:   WBC 13.7 (*)    Hemoglobin 10.7 (*)    HCT 35.6 (*)    Lymphs Abs 4.1 (*)    Eosinophils Absolute 1.0 (*)    Abs Immature Granulocytes 0.10 (*)    All other components within normal limits  I-STAT VENOUS BLOOD GAS, ED - Abnormal; Notable for the following components:   pCO2, Ven 40.5 (*)  pO2, Ven 52 (*)    Sodium 132 (*)    Potassium 5.8 (*)    Calcium , Ion 1.06 (*)    HCT 35.0 (*)    Hemoglobin 11.9 (*)    All other components within normal limits  CBG MONITORING, ED - Abnormal; Notable for the following components:   Glucose-Capillary 478 (*)    All other components within normal limits  CBG MONITORING, ED - Abnormal; Notable for the following components:   Glucose-Capillary 513 (*)    All other components within normal limits  CBG MONITORING, ED - Abnormal; Notable for the following components:   Glucose-Capillary 536 (*)    All other components within normal limits  TROPONIN I (HIGH SENSITIVITY) - Abnormal; Notable for the following components:   Troponin I (High Sensitivity) 18 (*)    All other components within normal limits  HCG, SERUM, QUALITATIVE  LIPASE, BLOOD  URINALYSIS, ROUTINE W REFLEX MICROSCOPIC  MAGNESIUM   TROPONIN I (HIGH SENSITIVITY)    EKG: EKG Interpretation Date/Time:  Monday February 03 2024 11:50:35 EDT Ventricular Rate:  96 PR Interval:  154 QRS Duration:  108 QT Interval:  364 QTC Calculation: 460 R Axis:   22  Text Interpretation: Sinus rhythm Borderline T abnormalities, inferior leads Baseline wander in lead(s) V2 similar to Jan 31 2024 Confirmed by Freddi Hamilton [phone]) on 02/03/2024 12:59:30 PM  Radiology: CT ABDOMEN PELVIS WO CONTRAST Result Date: 02/03/2024 CLINICAL DATA:  Abdominal pain. EXAM: CT ABDOMEN AND  PELVIS WITHOUT CONTRAST TECHNIQUE: Multidetector CT imaging of the abdomen and pelvis was performed following the standard protocol without IV contrast. RADIATION DOSE REDUCTION: This exam was performed according to the departmental dose-optimization program which includes automated exposure control, adjustment of the mA and/or kV according to patient size and/or use of iterative reconstruction technique. COMPARISON:  01/15/2024. FINDINGS: Lower chest: Septal thickening and ground-glass in the lung bases. Small to moderate right pleural effusion. Heart is enlarged. Trace pericardial fluid may be physiologic. Distal esophagus is grossly unremarkable. Hepatobiliary: Liver is enlarged, 21.4 cm. Liver is otherwise grossly unremarkable. Cholecystectomy. No biliary ductal dilatation. Pancreas: Grossly unremarkable. Spleen: Negative. Adrenals/Urinary Tract: Adrenal glands and kidneys are grossly unremarkable. Ureters are decompressed. Bladder is grossly unremarkable. Stomach/Bowel: Stomach, small bowel, appendix and colon are unremarkable. Vascular/Lymphatic: Vascular structures are unremarkable. Scattered abdominal peritoneal ligament and retroperitoneal lymph nodes are not enlarged by CT size criteria. Reproductive: Uterus is visualized.  No adnexal mass. Other: Large ascites.  Omental congestion/edema. Musculoskeletal: None. IMPRESSION: 1. Large ascites. 2. Congestive heart failure. 3. Hepatomegaly. Electronically Signed   By: Newell Eke M.D.   On: 02/03/2024 13:50   DG Chest 2 View Result Date: 02/03/2024 CLINICAL DATA:  Chest and abdominal pain. EXAM: CHEST - 2 VIEW COMPARISON:  Radiograph 01/26/2024 FINDINGS: Low lung volumes. Cardiomegaly is stable. Unchanged mediastinal contours. Central vascular congestion. Mild atelectasis at both lung bases, left greater than right. No confluent opacity. No pleural effusion or pneumothorax. Vascular stent in the right proximal arm. No acute osseous findings. IMPRESSION:  1. Low lung volumes with mild bibasilar atelectasis. 2. Stable cardiomegaly. Central vascular congestion. Electronically Signed   By: Andrea Gasman M.D.   On: 02/03/2024 12:19     .Critical Care  Performed by: Freddi Hamilton, MD Authorized by: Freddi Hamilton, MD   Critical care provider statement:    Critical care time (minutes):  30   Critical care time was exclusive of:  Separately billable procedures and treating other patients   Critical care was  time spent personally by me on the following activities:  Development of treatment plan with patient or surrogate, discussions with consultants, evaluation of patient's response to treatment, examination of patient, ordering and review of laboratory studies, ordering and review of radiographic studies, ordering and performing treatments and interventions, pulse oximetry, re-evaluation of patient's condition and review of old charts    Medications Ordered in the ED  insulin  regular, human (MYXREDLIN ) 100 units/ 100 mL infusion (5.5 Units/hr Intravenous New Bag/Given 02/03/24 1405)  dextrose  50 % solution 0-50 mL (has no administration in time range)  dextrose  5 % in lactated ringers  infusion (has no administration in time range)  albuterol  (PROVENTIL ) (2.5 MG/3ML) 0.083% nebulizer solution 2.5 mg (has no administration in time range)  bumetanide  (BUMEX ) tablet 10 mg (has no administration in time range)  calcitRIOL  (ROCALTROL ) capsule 1.25 mcg (has no administration in time range)  carvedilol  (COREG ) tablet 25 mg (has no administration in time range)  DULoxetine  (CYMBALTA ) DR capsule 20 mg (has no administration in time range)  lamoTRIgine  (LAMICTAL ) tablet 200 mg (has no administration in time range)  OLANZapine  zydis (ZYPREXA ) disintegrating tablet 5 mg (has no administration in time range)  sodium bicarbonate  tablet 650 mg (has no administration in time range)  sacubitril-valsartan (ENTRESTO) 24-26 mg per tablet (has no administration in  time range)  rosuvastatin  (CRESTOR ) tablet 40 mg (has no administration in time range)  prochlorperazine  (COMPAZINE ) tablet 5 mg (has no administration in time range)  Pancrelipase  (Lip-Prot-Amyl) 3000-9500 units CPEP (has no administration in time range)  hydrALAZINE  (APRESOLINE ) injection 10 mg (has no administration in time range)  heparin  injection 5,000 Units (has no administration in time range)  sodium chloride  flush (NS) 0.9 % injection 3 mL (has no administration in time range)  acetaminophen  (TYLENOL ) tablet 650 mg (has no administration in time range)    Or  acetaminophen  (TYLENOL ) suppository 650 mg (has no administration in time range)  morphine  (PF) 2 MG/ML injection 2 mg (has no administration in time range)  sodium chloride  flush (NS) 0.9 % injection 3 mL (has no administration in time range)  sodium chloride  flush (NS) 0.9 % injection 3 mL (has no administration in time range)  0.9 %  sodium chloride  infusion (has no administration in time range)  Chlorhexidine  Gluconate Cloth 2 % PADS 6 each (has no administration in time range)  fentaNYL  (SUBLIMAZE ) injection 50 mcg (50 mcg Intravenous Given 02/03/24 1116)  fentaNYL  (SUBLIMAZE ) injection 50 mcg (50 mcg Intravenous Given 02/03/24 1351)  sodium zirconium cyclosilicate  (LOKELMA ) packet 10 g (10 g Oral Given 02/03/24 1352)                                    Medical Decision Making Amount and/or Complexity of Data Reviewed Labs: ordered.    Details: Hyperkalemia Radiology: ordered and independent interpretation performed.    Details: Ascites ECG/medicine tests: ordered and independent interpretation performed.    Details: No hyperkalemia or ischemic changes  Risk Prescription drug management. Decision regarding hospitalization.   Patient's chest pain and abdominal pain seems to be more of a recurrent issue.  Workup thus far does not show any signs of an MI or obvious intra-abdominal catastrophe.  Does have ascites  which is a recurrent issue.  She is feeling somewhat better with some fentanyl .  Does have some moderate hyperkalemia at 5.9.  Started on some insulin  for her hyperglycemia though no evidence of  DKA.  She will need admission, I have consulted Dr. Gearline of nephrology as well as Dr. Tobie of the hospitalist service.     Final diagnoses:  Hyperglycemia  Hyperkalemia    ED Discharge Orders     None          Freddi Hamilton, MD 02/03/24 1453

## 2024-02-03 NOTE — ED Notes (Signed)
 Per endotool instructions insulin  drip restarted at 0.4ml/hr

## 2024-02-03 NOTE — ED Notes (Signed)
 CCMD called.

## 2024-02-03 NOTE — Anesthesia Postprocedure Evaluation (Signed)
 Anesthesia Post Note  Patient: Ashley Freeman  Procedure(s) Performed: EGD (ESOPHAGOGASTRODUODENOSCOPY)     Patient location during evaluation: PACU Anesthesia Type: MAC Level of consciousness: awake and alert Pain management: pain level controlled Vital Signs Assessment: post-procedure vital signs reviewed and stable Respiratory status: spontaneous breathing, nonlabored ventilation and respiratory function stable Cardiovascular status: stable and blood pressure returned to baseline Anesthetic complications: no   No notable events documented.  Last Vitals:  Vitals:   01/31/24 1720 01/31/24 2020  BP: 105/65 (!) 163/93  Pulse:  79  Resp:  20  Temp: 36.4 C 36.7 C  SpO2:  93%    Last Pain:  Vitals:   01/31/24 2020  TempSrc: Oral  PainSc:                  Debby FORBES Like

## 2024-02-03 NOTE — ED Notes (Signed)
 Pt report abd pain 8/10, will administer prn pain meds

## 2024-02-03 NOTE — ED Notes (Signed)
 Patient transported to X-ray

## 2024-02-03 NOTE — ED Notes (Signed)
 Reported to provider large volume emesis with food content from greater than 24 hours ago.

## 2024-02-03 NOTE — ED Notes (Signed)
 Insulin  drip stopped per endotool instructions

## 2024-02-03 NOTE — Consult Note (Signed)
 [facility] Renal Consultation Note    Indication for Consultation:  Management of ESRD/hemodialysis; anemia, hypertension/volume and secondary hyperparathyroidism   HPI: Dayelin Balducci [place] is a 33 y.o. female with ESRD on HD T1DM, HFrEF, medical noncompliance. Multiple admissions with DKA and missed dialysis. Most recently she was admitted to [facility] on 01/26/24 with DKA, she left AMA on 01/31/24. Today she presents with chest pain and abdominal pain. CXR shows central vascular congestion. CT abdomen notable for large ascites, CHF, hepatomegaly.  Labs notable for hyperkalemia, hyperglycemia, elevated liver enzymes.   Nephrology consulted for dialysis needs. Dialysis MWF at [facility]. She was last dialyzed during the prior admission on 01/30/24. She did present for outpatient dialysis today and was told the clinic was full. I spoke to the clinic RN who says that is possible due to staffing shortages. She was last seen at the clinic on 9/9 and he states she may be getting discharged.   Seen and examined in the ED. Hypertensive. Insulin  drip started. She is not very interactive but states she feels bad. Endorses abdominal pain, states it hurts to eat, some nausea. Denies f/c, shortness of breath, vomiting, hematemesis.   Past Medical History:  Diagnosis Date   Abscess, gluteal, right 08/24/2013   Anemia 02/19/2012   Bartholin's gland abscess 09/19/2013   Bipolar disorder (HCC)    BV (bacterial vaginosis) 11/24/2015   Depression    Diabetes mellitus type I (HCC) 2001   Diagnosed at age 50 ; Type I   Diarrhea 05/30/2016   DKA (diabetic ketoacidoses) 08/19/2013   Also in 2018   ESRD (end stage renal disease) (HCC)    Gonorrhea 08/2011   Treated in 09/2011   HFrEF (heart failure with reduced ejection fraction) (HCC)    a. 2022 Echo: EF 40%; b. 10/2021 Echo: EF 55%; b. 07/2022 MV: No ischemia. EF 31%; c. 08/2022 Echo: EF 35%, mildly dil RV, sev TR.   History of  trichomoniasis 05/31/2016   Hyperlipidemia 03/28/2016   Hypertension    NICM (nonischemic cardiomyopathy) (HCC)    Sepsis (HCC) 09/19/2013   Past Surgical History:  Procedure Laterality Date   A/V FISTULAGRAM Right 06/17/2023   Procedure: A/V Fistulagram;  Surgeon: Marea Selinda RAMAN, MD;  Location: [facility] INVASIVE CV LAB;  Service: Cardiovascular;  Laterality: Right;   A/V SHUNT INTERVENTION N/A 09/25/2023   Procedure: A/V SHUNT INTERVENTION;  Surgeon: Pearline Norman RAMAN, MD;  Location: [facility] PV LAB;  Service: Cardiovascular;  Laterality: N/A;   AV FISTULA PLACEMENT Right 07/06/2022   Procedure: ARTERIOVENOUS GRAFT CREATION;  Surgeon: Gretta Lonni PARAS, MD;  Location: [facility] OR;  Service: Vascular;  Laterality: Right;   CESAREAN SECTION N/A 10/05/2019   Procedure: CESAREAN SECTION;  Surgeon: Izell Harari, MD;  Location: [facility] LD ORS;  Service: Obstetrics;  Laterality: N/A;   CHOLECYSTECTOMY N/A 07/02/2023   Procedure: LAPAROSCOPIC CHOLECYSTECTOMY;  Surgeon: Ebbie Cough, MD;  Location: [facility] OR;  Service: General;  Laterality: N/A;   ESOPHAGOGASTRODUODENOSCOPY N/A 01/31/2024   Procedure: EGD (ESOPHAGOGASTRODUODENOSCOPY);  Surgeon: San Sandor GAILS, DO;  Location: [facility] ENDOSCOPY;  Service: Gastroenterology;  Laterality: N/A;   INCISION AND DRAINAGE ABSCESS Left 09/28/2019   Procedure: INCISION AND DRAINAGE VULVAR ABCESS;  Surgeon: Edsel Norleen GAILS, MD;  Location: [facility] OR;  Service: Gynecology;  Laterality: Left;   INCISION AND DRAINAGE PERIRECTAL ABSCESS Right 08/18/2013   Procedure: IRRIGATION AND DEBRIDEMENT GLUTEAL ABSCESS;  Surgeon: Lynda Leos, MD;  Location: [facility] OR;  Service: General;  Laterality: Right;   INCISION  AND DRAINAGE PERIRECTAL ABSCESS Right 09/19/2013   Procedure: IRRIGATION AND DEBRIDEMENT RIGHT GLUTEAL AND LABIAL ABSCESSES;  Surgeon: Lynda Leos, MD;  Location: [facility] OR;  Service: General;  Laterality: Right;   INCISION AND DRAINAGE PERIRECTAL ABSCESS Right 09/24/2013    Procedure: IRRIGATION AND DEBRIDEMENT PERIRECTAL ABSCESS;  Surgeon: Lynwood MALVA Pina, MD;  Location: [facility] OR;  Service: General;  Laterality: Right;   IR PARACENTESIS  08/28/2023   IR PARACENTESIS  11/04/2023   IR PARACENTESIS  12/23/2023   Family History  Problem Relation Age of Onset   Asthma Mother    Carpal tunnel syndrome Mother    Gout Father    Diabetes Paternal Grandmother    Anesthesia problems Neg Hx    Social History:  reports that she has never smoked. She has never been exposed to tobacco smoke. She has never used smokeless tobacco. She reports that she does not currently use alcohol. She reports that she does not use drugs. Allergies  Allergen Reactions   Keflex  [Cephalexin ] Anaphylaxis    Ceftriaxone  in the past with no reaction   Penicillins Anaphylaxis, Hives and Rash   Vibramycin  [Doxycycline ] Anaphylaxis   Benadryl  [Diphenhydramine ] Itching   Dilaudid  [Hydromorphone ] Itching   Methotrexate Derivatives Rash   Roxicodone  [Oxycodone ] Itching    Takes Percocet without issue   Prior to Admission medications   Medication Sig Start Date End Date Taking? Authorizing Provider  albuterol  (VENTOLIN  HFA) 108 (90 Base) MCG/ACT inhaler Inhale 2 puffs into the lungs every 4 (four) hours as needed for wheezing or shortness of breath. 11/24/22   [provider]  bumetanide  (BUMEX ) 2 MG tablet Take 10 mg by mouth daily.    [provider]  calcitRIOL  (ROCALTROL ) 0.25 MCG capsule Take 5 capsules (1.25 mcg total) by mouth every Tuesday, Thursday, and Saturday at 6 PM. Patient taking differently: Take 1.25 mcg by mouth every Monday, Wednesday, and Friday. 07/09/23   Cindy Garnette POUR, MD  carvedilol  (COREG ) 25 MG tablet Take 25 mg by mouth 2 (two) times daily with a meal.    [provider]  DULoxetine  (CYMBALTA ) 20 MG capsule Take 20 mg by mouth daily. 07/29/23   [provider]  famotidine  (PEPCID ) 20 MG tablet Take 20 mg by mouth daily. 07/29/23   [provider]  fluticasone  (FLONASE ) 50 MCG/ACT nasal spray Place 2 sprays into both nostrils daily. 12/11/23 12/10/24  [provider]  hydrOXYzine  (ATARAX ) 25 MG tablet Take 25 mg by mouth 3 (three) times daily as needed for anxiety, itching, nausea or vomiting.    [provider]  insulin  aspart (NOVOLOG ) 100 UNIT/ML injection Inject 5 Units into the skin 3 (three) times daily with meals.    [provider]  ketoconazole  (NIZORAL ) 2 % cream Apply 1 Application topically 2 (two) times daily. Apply 1gm twice daily to affected skin on feet 01/06/24   McCaughan, Dia D, DPM  lamoTRIgine  (LAMICTAL ) 200 MG tablet Take 1 tablet (200 mg total) by mouth daily. 10/29/23   Regalado, Belkys A, MD  LANTUS  SOLOSTAR 100 UNIT/ML Solostar Pen Inject 10 Units into the skin daily.    [provider]  NOVOLOG  FLEXPEN 100 UNIT/ML FlexPen INJECT 5 UNITS UNDER THE SKIN 3 TIMES A DAY BEFORE MEALS. IF YOU DO NOT EAT A MEAL, PLEASE CHEECK YOUR BLOOD SUGAR AND GIVE YOURSELF 1 UNIT OF INSULIN  FOR EVERY 50MG /DL GREATER THAN 869. MAX 30 UNITS PER DAY. 01/18/24   [provider]  OLANZapine  zydis (ZYPREXA ) 5  MG disintegrating tablet Take 5 mg by mouth at bedtime. 09/23/23 02/08/24  [provider]  omeprazole  (PRILOSEC) 40 MG capsule Take 40 mg by mouth daily as needed (for acid refulx). 12/11/23 02/09/24  [provider]  ondansetron  (ZOFRAN -ODT) 4 MG disintegrating tablet Take 1 tablet (4 mg total) by mouth every 8 (eight) hours as needed for nausea or vomiting. 06/23/23   Davis, Jonathon H, MD  oxyCODONE -acetaminophen  (PERCOCET/ROXICET) 5-325 MG tablet Take 1 tablet by mouth every 4 (four) hours as needed for moderate pain (pain score 4-6). 01/20/24   [provider]  Pancrelipase , Lip-Prot-Amyl, 3000-9500 units CPEP Take 3,000 units of lipase by mouth 3 (three) times daily before meals. 09/23/23 02/08/24  [provider]  prochlorperazine  (COMPAZINE ) 5 MG  tablet Take 5 mg by mouth every 6 (six) hours as needed for nausea or vomiting.    [provider]  rosuvastatin  (CRESTOR ) 40 MG tablet Take 40 mg by mouth daily. 01/20/24 01/19/25  [provider]  sacubitril-valsartan (ENTRESTO) 24-26 MG Take 1 tablet by mouth 2 (two) times daily.    [provider]  sodium bicarbonate  650 MG tablet Take 650 mg by mouth 2 (two) times daily. 07/29/23   [provider]  SUMAtriptan  (IMITREX ) 50 MG tablet Take 50 mg by mouth every 2 (two) hours as needed for migraine or headache. 06/20/22   [provider]   Current Facility-Administered Medications  Medication Dose Route Frequency Provider Last Rate Last Admin   0.9 %  sodium chloride  infusion  250 mL Intravenous PRN Patel, Ekta V, MD       acetaminophen  (TYLENOL ) tablet 650 mg  650 mg Oral Q6H PRN Patel, Ekta V, MD       Or   acetaminophen  (TYLENOL ) suppository 650 mg  650 mg Rectal Q6H PRN Patel, Ekta V, MD       albuterol  (VENTOLIN  HFA) 108 (90 Base) MCG/ACT inhaler 2 puff  2 puff Inhalation Q4H PRN Tobie Mario GAILS, MD       bumetanide  (BUMEX ) tablet 10 mg  10 mg Oral Daily Tobie Mario GAILS, MD       calcitRIOL  (ROCALTROL ) capsule 1.25 mcg  1.25 mcg Oral Q M,W,F Tobie Mario GAILS, MD       carvedilol  (COREG ) tablet 25 mg  25 mg Oral BID WC Tobie Mario GAILS, MD       dextrose  5 % in lactated ringers  infusion   Intravenous Continuous Freddi Hamilton, MD       dextrose  50 % solution 0-50 mL  0-50 mL Intravenous PRN Goldston, Scott, MD       DULoxetine  (CYMBALTA ) DR capsule 20 mg  20 mg Oral Daily Patel, Ekta V, MD       heparin  injection 5,000 Units  5,000 Units Subcutaneous Q8H Tobie Mario GAILS, MD       hydrALAZINE  (APRESOLINE ) injection 10 mg  10 mg Intravenous Once Tobie Mario GAILS, MD       insulin  regular, human (MYXREDLIN ) 100 units/ 100 mL infusion   Intravenous Continuous Freddi Hamilton, MD 5.5 mL/hr at 02/03/24 1405 5.5 Units/hr at 02/03/24 1405   lamoTRIgine  (LAMICTAL ) tablet  200 mg  200 mg Oral Daily Tobie Mario GAILS, MD       morphine  (PF) 2 MG/ML injection 2 mg  2 mg Intravenous Q4H PRN Patel, Ekta V, MD       OLANZapine  zydis (ZYPREXA ) disintegrating tablet 5 mg  5 mg Oral QHS Tobie Mario GAILS, MD  Pancrelipase  (Lip-Prot-Amyl) 3000-9500 units CPEP   Oral TID AC Patel, Ekta V, MD       prochlorperazine  (COMPAZINE ) tablet 5 mg  5 mg Oral Q6H PRN Patel, Ekta V, MD       rosuvastatin  (CRESTOR ) tablet 40 mg  40 mg Oral Daily Patel, Ekta V, MD       sacubitril-valsartan (ENTRESTO) 24-26 mg per tablet  1 tablet Oral BID Tobie Mario GAILS, MD       sodium bicarbonate  tablet 650 mg  650 mg Oral BID Patel, Ekta V, MD       sodium chloride  flush (NS) 0.9 % injection 3 mL  3 mL Intravenous Q12H Tobie Mario GAILS, MD       sodium chloride  flush (NS) 0.9 % injection 3 mL  3 mL Intravenous Q12H Tobie Mario GAILS, MD       sodium chloride  flush (NS) 0.9 % injection 3 mL  3 mL Intravenous PRN Tobie Mario GAILS, MD       Current Outpatient Medications  Medication Sig Dispense Refill   albuterol  (VENTOLIN  HFA) 108 (90 Base) MCG/ACT inhaler Inhale 2 puffs into the lungs every 4 (four) hours as needed for wheezing or shortness of breath.     bumetanide  (BUMEX ) 2 MG tablet Take 10 mg by mouth daily.     calcitRIOL  (ROCALTROL ) 0.25 MCG capsule Take 5 capsules (1.25 mcg total) by mouth every Tuesday, Thursday, and Saturday at 6 PM. (Patient taking differently: Take 1.25 mcg by mouth every Monday, Wednesday, and Friday.) 30 capsule 0   carvedilol  (COREG ) 25 MG tablet Take 25 mg by mouth 2 (two) times daily with a meal.     DULoxetine  (CYMBALTA ) 20 MG capsule Take 20 mg by mouth daily.     famotidine  (PEPCID ) 20 MG tablet Take 20 mg by mouth daily.     fluticasone  (FLONASE ) 50 MCG/ACT nasal spray Place 2 sprays into both nostrils daily.     hydrOXYzine  (ATARAX ) 25 MG tablet Take 25 mg by mouth 3 (three) times daily as needed for anxiety, itching, nausea or vomiting.     insulin  aspart (NOVOLOG ) 100  UNIT/ML injection Inject 5 Units into the skin 3 (three) times daily with meals.     ketoconazole  (NIZORAL ) 2 % cream Apply 1 Application topically 2 (two) times daily. Apply 1gm twice daily to affected skin on feet 60 g 2   lamoTRIgine  (LAMICTAL ) 200 MG tablet Take 1 tablet (200 mg total) by mouth daily. 30 tablet 0   LANTUS  SOLOSTAR 100 UNIT/ML Solostar Pen Inject 10 Units into the skin daily.     NOVOLOG  FLEXPEN 100 UNIT/ML FlexPen INJECT 5 UNITS UNDER THE SKIN 3 TIMES A DAY BEFORE MEALS. IF YOU DO NOT EAT A MEAL, PLEASE CHEECK YOUR BLOOD SUGAR AND GIVE YOURSELF 1 UNIT OF INSULIN  FOR EVERY 50MG /DL GREATER THAN 869. MAX 30 UNITS PER DAY.     OLANZapine  zydis (ZYPREXA ) 5 MG disintegrating tablet Take 5 mg by mouth at bedtime.     omeprazole  (PRILOSEC) 40 MG capsule Take 40 mg by mouth daily as needed (for acid refulx).     ondansetron  (ZOFRAN -ODT) 4 MG disintegrating tablet Take 1 tablet (4 mg total) by mouth every 8 (eight) hours as needed for nausea or vomiting. 10 tablet 0   oxyCODONE -acetaminophen  (PERCOCET/ROXICET) 5-325 MG tablet Take 1 tablet by mouth every 4 (four) hours as needed for moderate pain (pain score 4-6).     Pancrelipase , Lip-Prot-Amyl, 3000-9500 units CPEP Take 3,000 units  of lipase by mouth 3 (three) times daily before meals.     prochlorperazine  (COMPAZINE ) 5 MG tablet Take 5 mg by mouth every 6 (six) hours as needed for nausea or vomiting.     rosuvastatin  (CRESTOR ) 40 MG tablet Take 40 mg by mouth daily.     sacubitril-valsartan (ENTRESTO) 24-26 MG Take 1 tablet by mouth 2 (two) times daily.     sodium bicarbonate  650 MG tablet Take 650 mg by mouth 2 (two) times daily.     SUMAtriptan  (IMITREX ) 50 MG tablet Take 50 mg by mouth every 2 (two) hours as needed for migraine or headache.       ROS: As per HPI otherwise negative.  Physical Exam: Vitals:   02/03/24 0957 02/03/24 0959 02/03/24 1200 02/03/24 1355  BP:   (!) 175/112 (!) 176/109  Pulse:   96 96  Resp:   (!) 21  (!) 21  Temp:  (!) 97.3 F (36.3 C)  (!) 97 F (36.1 C)  TempSrc:  Oral  Axillary  SpO2:   98% 100%  Weight: 60.3 kg     Height: 5' 3 (1.6 m)        General: Chronically ill appearing, on room air, nad Head: NCAT sclera not icteric MMM Neck: Supple. No JVD appreciated  Lungs: Clear bilaterally without wheezes, rales, or rhonchi.  Heart: RRR, soft systolic murmur  Abdomen: slightly distended, bowel sounds normal, no masses  Lower extremities:without edema or ischemic changes, no open wounds  Neuro: A & O X 3. Moves all extremities spontaneously. Psych:  Flat affect  Dialysis Access: RUE AVG +bruit   Labs: Basic Metabolic Panel: Recent Labs  Lab 01/28/24 0520 01/28/24 1120 01/30/24 0258 01/31/24 0809 02/03/24 1100 02/03/24 1113  NA 139   < > 135 133* 134* 132*  K 3.7   < > 4.5 4.7 5.9* 5.8*  CL 99   < > 98 97* 99  --   CO2 25   < > 25 24 22   --   GLUCOSE 97   < > 158* 193* 539*  --   BUN 61*   < > 41* 30* 55*  --   CREATININE 8.20*   < > 6.61* 5.43* 8.20*  --   CALCIUM  8.2*   < > 8.1* 8.0* 8.7*  --   PHOS 7.9*  --   --   --   --   --    < > = values in this interval not displayed.   Liver Function Tests: Recent Labs  Lab 01/30/24 0258 01/31/24 0809 02/03/24 1100  AST 50* 33 74*  ALT 24 24 66*  ALKPHOS 469* 507* 655*  BILITOT 0.6 0.8 0.9  PROT 4.5* 5.0* 6.3*  ALBUMIN  2.2* 2.5* 3.2*   No results for input(s): LIPASE, AMYLASE in the last 168 hours. No results for input(s): AMMONIA in the last 168 hours. CBC: Recent Labs  Lab 01/28/24 2329 01/29/24 1302 01/30/24 0258 01/31/24 0809 02/03/24 1100 02/03/24 1113  WBC 16.2* 11.3* 13.1* 11.4* 13.7*  --   NEUTROABS  --  7.4  --   --  7.6  --   HGB 10.1* 10.3* 9.9* 10.3* 10.7* 11.9*  HCT 33.0* 34.0* 32.6* 34.2* 35.6* 35.0*  MCV 87.5 90.4 89.6 90.0 89.4  --   PLT 258 180 214 216 327  --    Cardiac Enzymes: No results for input(s): CKTOTAL, CKMB, CKMBINDEX, TROPONINI in the last 168  hours. CBG: Recent Labs  Lab 01/31/24 [phone]  01/31/24 1123 01/31/24 1729 02/03/24 1008 02/03/24 1353  GLUCAP 251* 262* 94 478* 513*   Iron  Studies: No results for input(s): IRON , TIBC, TRANSFERRIN, FERRITIN in the last 72 hours. Studies/Results: CT ABDOMEN PELVIS WO CONTRAST Result Date: 02/03/2024 CLINICAL DATA:  Abdominal pain. EXAM: CT ABDOMEN AND PELVIS WITHOUT CONTRAST TECHNIQUE: Multidetector CT imaging of the abdomen and pelvis was performed following the standard protocol without IV contrast. RADIATION DOSE REDUCTION: This exam was performed according to the departmental dose-optimization program which includes automated exposure control, adjustment of the mA and/or kV according to patient size and/or use of iterative reconstruction technique. COMPARISON:  01/15/2024. FINDINGS: Lower chest: Septal thickening and ground-glass in the lung bases. Small to moderate right pleural effusion. Heart is enlarged. Trace pericardial fluid may be physiologic. Distal esophagus is grossly unremarkable. Hepatobiliary: Liver is enlarged, 21.4 cm. Liver is otherwise grossly unremarkable. Cholecystectomy. No biliary ductal dilatation. Pancreas: Grossly unremarkable. Spleen: Negative. Adrenals/Urinary Tract: Adrenal glands and kidneys are grossly unremarkable. Ureters are decompressed. Bladder is grossly unremarkable. Stomach/Bowel: Stomach, small bowel, appendix and colon are unremarkable. Vascular/Lymphatic: Vascular structures are unremarkable. Scattered abdominal peritoneal ligament and retroperitoneal lymph nodes are not enlarged by CT size criteria. Reproductive: Uterus is visualized.  No adnexal mass. Other: Large ascites.  Omental congestion/edema. Musculoskeletal: None. IMPRESSION: 1. Large ascites. 2. Congestive heart failure. 3. Hepatomegaly. Electronically Signed   By: Newell Eke M.D.   On: 02/03/2024 13:50   DG Chest 2 View Result Date: 02/03/2024 CLINICAL DATA:  Chest and abdominal  pain. EXAM: CHEST - 2 VIEW COMPARISON:  Radiograph 01/26/2024 FINDINGS: Low lung volumes. Cardiomegaly is stable. Unchanged mediastinal contours. Central vascular congestion. Mild atelectasis at both lung bases, left greater than right. No confluent opacity. No pleural effusion or pneumothorax. Vascular stent in the right proximal arm. No acute osseous findings. IMPRESSION: 1. Low lung volumes with mild bibasilar atelectasis. 2. Stable cardiomegaly. Central vascular congestion. Electronically Signed   By: Andrea Gasman M.D.   On: 02/03/2024 12:19    Dialysis Orders:  [facility] ([address]) (last dialyzed there on 9/9)  MWF 3:45  450/800 EDW 61 kg 2K/2.5Ca  -Heparin  1000 units bolus + 600 units/hr  -Tums 1500 q HD, calcitriol  1 mcg q HD, Sensipar  60 q HD    Assessment/Plan: Hyperglycemia. Uncontrolled T1DM. Recently admitted with DKA. Insulin  gtt started in ED. Per primary  Abdominal pain. Large ascites, hepatomegaly on Abd CT.  ESRD: Usual MWF schedule. HD today  Hyperkalemia:  Will correct with HD, also with insulin  administration.  HTN/volume: Hypertensive with volume overload on admit. Should improve some with HD.  Anemia:Hgb 10-11 - monitor for now Secondary HPTH: Ca ok, Phos high - continue home calcitriol  and binders once eeating.  HFrEF (30-35%): Continue to optimize volume with HD     Maisie Ronnald Acosta PA-C [facility] 02/03/2024, 2:25 PM

## 2024-02-03 NOTE — ED Notes (Signed)
 Insulin  titrated to 0.5ml/hr per endotool instructions

## 2024-02-03 NOTE — ED Notes (Signed)
Got patient hooked up to the monitor

## 2024-02-03 NOTE — Progress Notes (Signed)
 Late Note Entry- February 03, 2024  Noted pt left AMA on Friday evening. Contacted [facility] this morning to make staff aware of the above info. Friday provider notes faxed to clinic for continuation of care.   Randine Mungo Dialysis Navigator [phone]

## 2024-02-03 NOTE — ED Notes (Addendum)
 Insulin  drip titrated to 0.7, next BG check at 20:34

## 2024-02-04 ENCOUNTER — Inpatient Hospital Stay (HOSPITAL_COMMUNITY)

## 2024-02-04 DIAGNOSIS — R0789 Other chest pain: Secondary | ICD-10-CM | POA: Diagnosis not present

## 2024-02-04 HISTORY — PX: IR PARACENTESIS: IMG2679

## 2024-02-04 LAB — CBG MONITORING, ED
Glucose-Capillary: 113 mg/dL — ABNORMAL HIGH (ref 70–99)
Glucose-Capillary: 133 mg/dL — ABNORMAL HIGH (ref 70–99)
Glucose-Capillary: 137 mg/dL — ABNORMAL HIGH (ref 70–99)
Glucose-Capillary: 161 mg/dL — ABNORMAL HIGH (ref 70–99)
Glucose-Capillary: 66 mg/dL — ABNORMAL LOW (ref 70–99)
Glucose-Capillary: 71 mg/dL (ref 70–99)

## 2024-02-04 LAB — MAGNESIUM: Magnesium: 2.9 mg/dL — ABNORMAL HIGH (ref 1.7–2.4)

## 2024-02-04 LAB — CBC
HCT: 32.1 % — ABNORMAL LOW (ref 36.0–46.0)
Hemoglobin: 9.7 g/dL — ABNORMAL LOW (ref 12.0–15.0)
MCH: 26.7 pg (ref 26.0–34.0)
MCHC: 30.2 g/dL (ref 30.0–36.0)
MCV: 88.4 fL (ref 80.0–100.0)
Platelets: 310 K/uL (ref 150–400)
RBC: 3.63 MIL/uL — ABNORMAL LOW (ref 3.87–5.11)
RDW: 14.5 % (ref 11.5–15.5)
WBC: 11.6 K/uL — ABNORMAL HIGH (ref 4.0–10.5)
nRBC: 0 % (ref 0.0–0.2)

## 2024-02-04 LAB — COMPREHENSIVE METABOLIC PANEL WITH GFR
ALT: 55 U/L — ABNORMAL HIGH (ref 0–44)
AST: 79 U/L — ABNORMAL HIGH (ref 15–41)
Albumin: 2.4 g/dL — ABNORMAL LOW (ref 3.5–5.0)
Alkaline Phosphatase: 545 U/L — ABNORMAL HIGH (ref 38–126)
Anion gap: 13 (ref 5–15)
BUN: 58 mg/dL — ABNORMAL HIGH (ref 6–20)
CO2: 22 mmol/L (ref 22–32)
Calcium: 8.4 mg/dL — ABNORMAL LOW (ref 8.9–10.3)
Chloride: 101 mmol/L (ref 98–111)
Creatinine, Ser: 8.45 mg/dL — ABNORMAL HIGH (ref 0.44–1.00)
GFR, Estimated: 6 mL/min — ABNORMAL LOW (ref >60)
Glucose, Bld: 146 mg/dL — ABNORMAL HIGH (ref 70–99)
Potassium: 5.5 mmol/L — ABNORMAL HIGH (ref 3.5–5.1)
Sodium: 136 mmol/L (ref 135–145)
Total Bilirubin: 0.9 mg/dL (ref 0.0–1.2)
Total Protein: 4.8 g/dL — ABNORMAL LOW (ref 6.5–8.1)

## 2024-02-04 LAB — GLUCOSE, CAPILLARY
Glucose-Capillary: 178 mg/dL — ABNORMAL HIGH (ref 70–99)
Glucose-Capillary: 112 mg/dL — ABNORMAL HIGH (ref 70–99)
Glucose-Capillary: 159 mg/dL — ABNORMAL HIGH (ref 70–99)
Glucose-Capillary: 204 mg/dL — ABNORMAL HIGH (ref 70–99)
Glucose-Capillary: 179 mg/dL — ABNORMAL HIGH (ref 70–99)
Glucose-Capillary: 126 mg/dL — ABNORMAL HIGH (ref 70–99)

## 2024-02-04 LAB — PROTIME-INR
INR: 1.3 — ABNORMAL HIGH (ref 0.8–1.2)
Prothrombin Time: 16.5 s — ABNORMAL HIGH (ref 11.4–15.2)

## 2024-02-04 MED ORDER — HEPARIN SODIUM (PORCINE) 1000 UNIT/ML DIALYSIS: 1000.0000 [IU] | INTRAMUSCULAR | Status: DC | PRN

## 2024-02-04 MED ORDER — HYDRALAZINE HCL 20 MG/ML IJ SOLN
10.0000 mg | Freq: Four times a day (QID) | INTRAMUSCULAR | Status: DC | PRN
  Administered 2024-02-07: 10 mg via INTRAVENOUS
  Filled 2024-02-04: qty 1

## 2024-02-04 MED ORDER — LIDOCAINE HCL 1 % IJ SOLN
INTRAMUSCULAR | Status: AC
  Filled 2024-02-04: qty 20

## 2024-02-04 MED ORDER — PANTOPRAZOLE SODIUM 40 MG PO TBEC
40.0000 mg | DELAYED_RELEASE_TABLET | Freq: Every day | ORAL | Status: DC
  Administered 2024-02-04 – 2024-02-06 (×3): 40 mg via ORAL
  Filled 2024-02-04 (×3): qty 1

## 2024-02-04 MED ORDER — DEXTROSE 50 % IV SOLN
25.0000 mL | Freq: Once | INTRAVENOUS | Status: AC
  Administered 2024-02-04: 25 mL via INTRAVENOUS
  Filled 2024-02-04: qty 50

## 2024-02-04 MED ORDER — HEPARIN SODIUM (PORCINE) 1000 UNIT/ML DIALYSIS: 1800.0000 [IU] | INTRAMUSCULAR | Status: DC | PRN

## 2024-02-04 MED ORDER — LIDOCAINE HCL (PF) 1 % IJ SOLN
10.0000 mL | Freq: Once | INTRAMUSCULAR | Status: DC
  Filled 2024-02-04: qty 30

## 2024-02-04 MED ORDER — LIDOCAINE HCL (PF) 1 % IJ SOLN: 5.0000 mL | INTRAMUSCULAR | Status: DC | PRN

## 2024-02-04 MED ORDER — INSULIN GLARGINE-YFGN 100 UNIT/ML ~~LOC~~ SOLN
5.0000 [IU] | Freq: Every day | SUBCUTANEOUS | Status: DC
  Filled 2024-02-04: qty 0.05

## 2024-02-04 MED ORDER — INSULIN GLARGINE 100 UNIT/ML ~~LOC~~ SOLN
10.0000 [IU] | Freq: Every day | SUBCUTANEOUS | Status: DC
  Filled 2024-02-04: qty 0.1

## 2024-02-04 MED ORDER — MORPHINE SULFATE (PF) 2 MG/ML IV SOLN
INTRAVENOUS | Status: AC
  Filled 2024-02-04: qty 1

## 2024-02-04 MED ORDER — PENTAFLUOROPROP-TETRAFLUOROETH EX AERO: 1.0000 | INHALATION_SPRAY | CUTANEOUS | Status: DC | PRN

## 2024-02-04 MED ORDER — HEPARIN SODIUM (PORCINE) 1000 UNIT/ML DIALYSIS: 1600.0000 [IU] | INTRAMUSCULAR | Status: DC | PRN

## 2024-02-04 MED ORDER — HEPARIN SODIUM (PORCINE) 1000 UNIT/ML DIALYSIS
40.0000 [IU]/kg | INTRAMUSCULAR | Status: DC | PRN
Start: 2024-02-04 — End: 2024-02-04

## 2024-02-04 MED ORDER — ANTICOAGULANT SODIUM CITRATE 4% (200MG/5ML) IV SOLN: 5.0000 mL | Status: DC | PRN

## 2024-02-04 MED ORDER — LIDOCAINE-PRILOCAINE 2.5-2.5 % EX CREA: 1.0000 | TOPICAL_CREAM | CUTANEOUS | Status: DC | PRN

## 2024-02-04 MED ORDER — HEPARIN SODIUM (PORCINE) 1000 UNIT/ML DIALYSIS: 2000.0000 [IU] | INTRAMUSCULAR | Status: DC | PRN

## 2024-02-04 MED ORDER — HEPARIN SODIUM (PORCINE) 1000 UNIT/ML DIALYSIS: 2600.0000 [IU] | INTRAMUSCULAR | Status: DC | PRN

## 2024-02-04 MED ORDER — INSULIN ASPART 100 UNIT/ML IJ SOLN: 4.0000 [IU] | Freq: Three times a day (TID) | INTRAMUSCULAR | Status: DC

## 2024-02-04 MED ORDER — ALTEPLASE 2 MG IJ SOLR: 2.0000 mg | Freq: Once | INTRAMUSCULAR | Status: DC | PRN

## 2024-02-04 MED ORDER — ANTICOAGULANT SODIUM CITRATE 4% (200MG/5ML) IV SOLN
5.0000 mL | Status: DC | PRN
  Filled 2024-02-04: qty 5

## 2024-02-04 MED ORDER — INSULIN GLARGINE 100 UNIT/ML ~~LOC~~ SOLN
5.0000 [IU] | Freq: Every day | SUBCUTANEOUS | Status: DC
Start: 2024-02-04 — End: 2024-02-07
  Administered 2024-02-04 – 2024-02-07 (×4): 5 [IU] via SUBCUTANEOUS
  Filled 2024-02-04 (×4): qty 0.05

## 2024-02-04 MED ORDER — INSULIN ASPART 100 UNIT/ML IJ SOLN
0.0000 [IU] | INTRAMUSCULAR | Status: DC
  Administered 2024-02-04: 2 [IU] via SUBCUTANEOUS
  Administered 2024-02-04 – 2024-02-05 (×3): 3 [IU] via SUBCUTANEOUS
  Administered 2024-02-06: 5 [IU] via SUBCUTANEOUS
  Administered 2024-02-06 (×2): 3 [IU] via SUBCUTANEOUS
  Administered 2024-02-06 (×2): 2 [IU] via SUBCUTANEOUS
  Administered 2024-02-06 – 2024-02-07 (×3): 3 [IU] via SUBCUTANEOUS
  Administered 2024-02-07: 2 [IU] via SUBCUTANEOUS
  Administered 2024-02-07: 1 [IU] via SUBCUTANEOUS

## 2024-02-04 MED ORDER — HEPARIN SODIUM (PORCINE) 1000 UNIT/ML DIALYSIS
2000.0000 [IU] | Freq: Once | INTRAMUSCULAR | Status: DC
Start: 2024-02-04 — End: 2024-02-04

## 2024-02-04 MED ORDER — INSULIN ASPART 100 UNIT/ML IJ SOLN: 0.0000 [IU] | Freq: Three times a day (TID) | INTRAMUSCULAR | Status: DC

## 2024-02-04 MED ORDER — HYDRALAZINE HCL 10 MG PO TABS
10.0000 mg | ORAL_TABLET | Freq: Once | ORAL | Status: AC
  Administered 2024-02-04: 10 mg via ORAL
  Filled 2024-02-04: qty 1

## 2024-02-04 MED ORDER — HEPARIN SODIUM (PORCINE) 1000 UNIT/ML DIALYSIS: 2500.0000 [IU] | Freq: Once | INTRAMUSCULAR | Status: DC

## 2024-02-04 NOTE — ED Notes (Signed)
 Current BG 66, admitting MD called, pt not tolerating OJ PO, orders for d50 iv

## 2024-02-04 NOTE — ED Notes (Signed)
 Insulin  titrated to 0.4ml/hr per endotool instructions. Last 3 BG checks have been within range, private message sent to admitting MD for further instructions

## 2024-02-04 NOTE — ED Notes (Signed)
 Report given to dialysis.

## 2024-02-04 NOTE — Progress Notes (Signed)
  Received patient in bed to unit.   Informed consent signed and in chart.    TX duration:3.5      Transported by  Hand-off given to patient's nurse.    Access used: left AVF Access issues: None   Total UF removed: 3000 Medication(s) given: Morphine  IV @0846  Post HD VS: 170/77      Hunter Hacking LPN [facility]

## 2024-02-04 NOTE — ED Notes (Addendum)
BG 161.

## 2024-02-04 NOTE — Progress Notes (Signed)
 PROGRESS NOTE    Ashley Freeman  FMW:[id number] DOB: 12/14/1990 DOA: 02/03/2024 PCP: Keven Crumbly Pap, MD   Brief Narrative:  Ashley Freeman is a 33 y.o. female with past medical history  of  HTN, HLD, ESRD on HD (MWF), HFrEF, uncontrolled DM type I, bipolar disorder, and noncompliance with multiple admissions for DKA and/or fluid overload presents with multiple complaints of chest pain, abdominal pain and generalized weakness.  Similar to her previous presentations patient seen and left AMA on October 3.   Admitted for management  of volume overload, ascites,  hyperglycemia and abdominal pain. Assessment & Plan:   Principal Problem:   Chest pain Active Problems:   Hypervolemia associated with renal insufficiency   Hyperkalemia   Anemia due to chronic kidney disease   ESRD on dialysis (HCC)   Chronic combined systolic and diastolic heart failure (HCC)   Essential hypertension   Bipolar disorder (HCC)   Hyperglycemia   Diabetic neuropathy (HCC)   Diabetic retinopathy (HCC)   Anasarca   Generalized abdominal pain   >>Abdominal pain: Secondary to large ascites, ?gastroparesis -IR paracentesis today -symptoms management  patient has had an endoscopy on 31 January 2024 for her nausea vomiting and dysphagia, showing LA grade D esophagitis with no bleeding, large amount of food residue in the stomach and a normal antrum normal duodenum and pylorus.  -CT abd pelvis wo on 10/6  today showing large ascites congestive heart failure, hepatomegaly. -morphine  prn   >>Chest pain: Likely from CHF/volume overload. Troponins are negative, EKG is showing mild T wave inversions in inferior leads which we will follow along with the troponins. Patient has had upper GI evaluation that has showed esophagitis.   CXR with pulm congestion, cardiomegaly VQ scan is pending HD per nephro   >> Hyperkalemia: Patient on dextrose , insulin , lokelma . HD    >> Diabetes  mellitus type 1: Hyperglycemia, in the ED patient started on insulin  drip, will manage patient on every 4 hours sliding scale and every 4 hours Accu-Cheks.  Add lantus  5U daily   >> End-stage renal disease on Monday Wednesday Friday hemodialysis: Nephrology on board, HD run today as well.    >> Anemia: Hemoglobin stable at 11.9 and will follow.  Type and screen.   >> Acute on chronic chronic HFrEF: Volume management with hemodialysis, patient currently has a rales and ascites.  Continue Entresto.   >> Essential hypertension: Bumex , entresto, coreg   DVT prophylaxis: scds, heparin  Code Status:  full Family Communication: none Disposition Plan: Status is: Inpatient Remains inpatient appropriate because: volume overload,electrolyte abnormality requiring daily HD    Consultants: nephro, ir  Procedures: paracentesis  Antimicrobials:    Subjective:  Pt is on room air. BP still elevated but better than yesterday. Got HD and paracentesis today Objective: Vitals:   02/04/24 1040 02/04/24 1135 02/04/24 1209 02/04/24 1540  BP: (!) 168/77 (!) 170/77 (!) 188/100 (!) 159/93  Pulse: (!) 104 94 97 94  Resp: 16 (!) 24 18 17   Temp:  (!) 97.3 F (36.3 C) 98.2 F (36.8 C) 98.3 F (36.8 C)  TempSrc:  Oral Oral Oral  SpO2: 96% 98% 94% 98%  Weight:      Height:        Intake/Output Summary (Last 24 hours) at 02/04/2024 1749 Last data filed at 02/04/2024 1135 Gross per 24 hour  Intake --  Output 3000 ml  Net -3000 ml   Filed Weights   02/03/24 0957  Weight: 60.3 kg  Data Reviewed: I have personally reviewed following labs and imaging studies  CBC: Recent Labs  Lab 01/29/24 1302 01/30/24 0258 01/31/24 0809 02/03/24 1100 02/03/24 1113 02/04/24 0440  WBC 11.3* 13.1* 11.4* 13.7*  --  11.6*  NEUTROABS 7.4  --   --  7.6  --   --   HGB 10.3* 9.9* 10.3* 10.7* 11.9* 9.7*  HCT 34.0* 32.6* 34.2* 35.6* 35.0* 32.1*  MCV 90.4 89.6 90.0 89.4  --  88.4  PLT 180 214 216 327  --   310   Basic Metabolic Panel: Recent Labs  Lab 01/28/24 2329 01/29/24 0746 01/29/24 1302 01/30/24 0258 01/31/24 0809 02/03/24 1100 02/03/24 1113 02/04/24 0440  NA 136   < > 134* 135 133* 134* 132* 136  K 4.1   < > 4.7 4.5 4.7 5.9* 5.8* 5.5*  CL 96*   < > 95* 98 97* 99  --  101  CO2 26   < > 21* 25 24 22   --  22  GLUCOSE 101*   < > 164* 158* 193* 539*  --  146*  BUN 33*   < > 35* 41* 30* 55*  --  58*  CREATININE 5.64*   < > 6.05* 6.61* 5.43* 8.20*  --  8.45*  CALCIUM  8.2*   < > 8.1* 8.1* 8.0* 8.7*  --  8.4*  MG 2.2  --   --  2.5* 2.7*  --   --  2.9*   < > = values in this interval not displayed.   GFR: Estimated Creatinine Clearance: 8 mL/min (A) (by C-G formula based on SCr of 8.45 mg/dL (H)). Liver Function Tests: Recent Labs  Lab 01/29/24 1302 01/30/24 0258 01/31/24 0809 02/03/24 1100 02/04/24 0440  AST 35 50* 33 74* 79*  ALT 20 24 24  66* 55*  ALKPHOS 493* 469* 507* 655* 545*  BILITOT 1.0 0.6 0.8 0.9 0.9  PROT 4.6* 4.5* 5.0* 6.3* 4.8*  ALBUMIN  2.4* 2.2* 2.5* 3.2* 2.4*   Recent Labs  Lab 02/03/24 1100  LIPASE 39   No results for input(s): AMMONIA in the last 168 hours. Coagulation Profile: Recent Labs  Lab 02/04/24 0440  INR 1.3*   Cardiac Enzymes: No results for input(s): CKTOTAL, CKMB, CKMBINDEX, TROPONINI in the last 168 hours. BNP (last 3 results) No results for input(s): PROBNP in the last 8760 hours. HbA1C: No results for input(s): HGBA1C in the last 72 hours. CBG: Recent Labs  Lab 02/04/24 0741 02/04/24 0918 02/04/24 1009 02/04/24 1213 02/04/24 1544  GLUCAP 178* 112* 159* 204* 179*   Lipid Profile: No results for input(s): CHOL, HDL, LDLCALC, TRIG, CHOLHDL, LDLDIRECT in the last 72 hours. Thyroid  Function Tests: No results for input(s): TSH, T4TOTAL, FREET4, T3FREE, THYROIDAB in the last 72 hours. Anemia Panel: No results for input(s): VITAMINB12, FOLATE, FERRITIN, TIBC, IRON , RETICCTPCT  in the last 72 hours. Sepsis Labs: Recent Labs  Lab 01/29/24 0746  LATICACIDVEN 1.0    Recent Results (from the past 240 hours)  Resp panel by RT-PCR (RSV, Flu A&B, Covid) Anterior Nasal Swab     Status: None   Collection Time: 01/26/24 12:24 PM   Specimen: Anterior Nasal Swab  Result Value Ref Range Status   SARS Coronavirus 2 by RT PCR NEGATIVE NEGATIVE Final   Influenza A by PCR NEGATIVE NEGATIVE Final   Influenza B by PCR NEGATIVE NEGATIVE Final    Comment: (NOTE) The Xpert Xpress SARS-CoV-2/FLU/RSV plus assay is intended as an aid in the diagnosis of influenza  from Nasopharyngeal swab specimens and should not be used as a sole basis for treatment. Nasal washings and aspirates are unacceptable for Xpert Xpress SARS-CoV-2/FLU/RSV testing.  Fact Sheet for Patients: BloggerCourse.com  Fact Sheet for Healthcare Providers: SeriousBroker.it  This test is not yet approved or cleared by the [place]  FDA and has been authorized for detection and/or diagnosis of SARS-CoV-2 by FDA under an Emergency Use Authorization (EUA). This EUA will remain in effect (meaning this test can be used) for the duration of the COVID-19 declaration under Section 564(b)(1) of the Act, 21 U.S.C. section 360bbb-3(b)(1), unless the authorization is terminated or revoked.     Resp Syncytial Virus by PCR NEGATIVE NEGATIVE Final    Comment: (NOTE) Fact Sheet for Patients: BloggerCourse.com  Fact Sheet for Healthcare Providers: SeriousBroker.it  This test is not yet approved or cleared by the [place]  FDA and has been authorized for detection and/or diagnosis of SARS-CoV-2 by FDA under an Emergency Use Authorization (EUA). This EUA will remain in effect (meaning this test can be used) for the duration of the COVID-19 declaration under Section 564(b)(1) of the Act, 21 U.S.C. section  360bbb-3(b)(1), unless the authorization is terminated or revoked.  Performed at [facility], [address], [place], [place] [address]   Blood culture (routine x 2)     Status: None   Collection Time: 01/26/24 12:35 PM   Specimen: BLOOD LEFT ARM  Result Value Ref Range Status   Specimen Description BLOOD LEFT ARM  Final   Special Requests   Final    BOTTLES DRAWN AEROBIC AND ANAEROBIC Blood Culture results may not be optimal due to an inadequate volume of blood received in culture bottles   Culture   Final    NO GROWTH 5 DAYS Performed at [facility], [address] [place], [place] [address]    Report Status 01/31/2024 FINAL  Final  Blood culture (routine x 2)     Status: None   Collection Time: 01/26/24  6:29 PM   Specimen: BLOOD LEFT HAND  Result Value Ref Range Status   Specimen Description BLOOD LEFT HAND  Final   Special Requests   Final    BOTTLES DRAWN AEROBIC AND ANAEROBIC Blood Culture adequate volume   Culture   Final    NO GROWTH 5 DAYS Performed at [facility], [address] [place], [place] [address]    Report Status 01/31/2024 FINAL  Final  MRSA Next Gen by PCR, Nasal     Status: None   Collection Time: 01/26/24  7:26 PM   Specimen: Nasal Mucosa; Nasal Swab  Result Value Ref Range Status   MRSA by PCR Next Gen NOT DETECTED NOT DETECTED Final    Comment: (NOTE) The GeneXpert MRSA Assay (FDA approved for NASAL specimens only), is one component of a comprehensive MRSA colonization surveillance program. It is not intended to diagnose MRSA infection nor to guide or monitor treatment for MRSA infections. Test performance is not FDA approved in patients less than 15 years old. Performed at [facility], [address], [place], [place] [address]          Radiology Studies: IR Paracentesis Result Date: 02/04/2024 INDICATION: 33 year old female. History of end-stage renal disease on hemodialysis with recurrent ascites. Request  is for therapeutic paracentesis EXAM: ULTRASOUND GUIDED THERAPEUTIC PARACENTESIS MEDICATIONS: Lidocaine  1% 10 mL COMPLICATIONS: None immediate. PROCEDURE: Informed written consent was obtained from the patient after a discussion of the risks,  benefits and alternatives to treatment. A timeout was performed prior to the initiation of the procedure. Initial ultrasound scanning demonstrates a moderate amount of ascites within the right lower abdominal quadrant. The right lower abdomen was prepped and draped in the usual sterile fashion. 1% lidocaine  was used for local anesthesia. Following this, a 6 Fr Safe-T-Centesis catheter was introduced. An ultrasound image was saved for documentation purposes. The paracentesis was performed. The catheter was removed and a dressing was applied. The patient tolerated the procedure well without immediate post procedural complication. FINDINGS: A total of approximately 3.1 L of straw-colored fluid was removed. IMPRESSION: Successful ultrasound-guided right-sided paracentesis yielding 3.1 liters of straw-colored peritoneal fluid. Performed by Delon Beagle NP PLAN: If the patient eventually requires >/=2 paracenteses in a 30 day period, candidacy for formal evaluation by the [facility] will be assessed. Electronically Signed   By: Juliene Balder M.D.   On: 02/04/2024 15:39   CT ABDOMEN PELVIS WO CONTRAST Result Date: 02/03/2024 CLINICAL DATA:  Abdominal pain. EXAM: CT ABDOMEN AND PELVIS WITHOUT CONTRAST TECHNIQUE: Multidetector CT imaging of the abdomen and pelvis was performed following the standard protocol without IV contrast. RADIATION DOSE REDUCTION: This exam was performed according to the departmental dose-optimization program which includes automated exposure control, adjustment of the mA and/or kV according to patient size and/or use of iterative reconstruction technique. COMPARISON:  01/15/2024. FINDINGS: Lower chest:  Septal thickening and ground-glass in the lung bases. Small to moderate right pleural effusion. Heart is enlarged. Trace pericardial fluid may be physiologic. Distal esophagus is grossly unremarkable. Hepatobiliary: Liver is enlarged, 21.4 cm. Liver is otherwise grossly unremarkable. Cholecystectomy. No biliary ductal dilatation. Pancreas: Grossly unremarkable. Spleen: Negative. Adrenals/Urinary Tract: Adrenal glands and kidneys are grossly unremarkable. Ureters are decompressed. Bladder is grossly unremarkable. Stomach/Bowel: Stomach, small bowel, appendix and colon are unremarkable. Vascular/Lymphatic: Vascular structures are unremarkable. Scattered abdominal peritoneal ligament and retroperitoneal lymph nodes are not enlarged by CT size criteria. Reproductive: Uterus is visualized.  No adnexal mass. Other: Large ascites.  Omental congestion/edema. Musculoskeletal: None. IMPRESSION: 1. Large ascites. 2. Congestive heart failure. 3. Hepatomegaly. Electronically Signed   By: Newell Eke M.D.   On: 02/03/2024 13:50   DG Chest 2 View Result Date: 02/03/2024 CLINICAL DATA:  Chest and abdominal pain. EXAM: CHEST - 2 VIEW COMPARISON:  Radiograph 01/26/2024 FINDINGS: Low lung volumes. Cardiomegaly is stable. Unchanged mediastinal contours. Central vascular congestion. Mild atelectasis at both lung bases, left greater than right. No confluent opacity. No pleural effusion or pneumothorax. Vascular stent in the right proximal arm. No acute osseous findings. IMPRESSION: 1. Low lung volumes with mild bibasilar atelectasis. 2. Stable cardiomegaly. Central vascular congestion. Electronically Signed   By: Andrea Gasman M.D.   On: 02/03/2024 12:19        Scheduled Meds:  bumetanide   10 mg Oral Daily   calcitRIOL   1.25 mcg Oral Q M,W,F   carvedilol   25 mg Oral BID WC   Chlorhexidine  Gluconate Cloth  6 each Topical Q0600   DULoxetine   20 mg Oral Daily   heparin   5,000 Units Subcutaneous BID   insulin  aspart   0-9 Units Subcutaneous Q4H   insulin  glargine  5 Units Subcutaneous Daily   lamoTRIgine   200 mg Oral Daily   lidocaine  (PF)  10 mL Infiltration Once   lipase/protease/amylase  12,000 Units Oral TID AC   OLANZapine  zydis  5 mg Oral QHS   pantoprazole   40 mg Oral Daily   rosuvastatin   40 mg Oral Daily   sodium bicarbonate   650 mg Oral BID   sodium chloride  flush  3 mL Intravenous Q12H   sodium chloride  flush  3 mL Intravenous Q12H   Continuous Infusions:        Sidi Dzikowski, MD [facility] 02/04/2024, 5:49 PM

## 2024-02-04 NOTE — ED Notes (Signed)
 Current bp 185/105, private message sent to admitting md

## 2024-02-04 NOTE — ED Notes (Signed)
 Insulin  drip stopped, Admitting md aware, OJ given to pot, tolerated well

## 2024-02-04 NOTE — Procedures (Signed)
Ultrasound-guided therapeutic paracentesis performed yielding 3.1 liters of straw colored fluid.No immediate complications. EBL is none. ? ?

## 2024-02-04 NOTE — Progress Notes (Addendum)
 Pt receives out-pt HD at [facility] on MWF. Spoke to pt's clinic this morning and they are aware of pt's admission. Will assist as needed.   Randine Mungo Dialysis Navigator [phone]

## 2024-02-04 NOTE — Procedures (Signed)
 Patient seen and examined on Hemodialysis. The procedure was supervised and I have made appropriate changes. BP (!) 168/77   Pulse (!) 104   Temp 98.1 F (36.7 C)   Resp 16   Ht 5' 3 (1.6 m)   Wt 60.3 kg   SpO2 96%   BMI 23.56 kg/m   QB 400 mL/ min via TDC UF goal 3L  Tolerating treatment without complaints at this time.   Ashley Bonine MD [facility] [phone] 11:05 AM

## 2024-02-04 NOTE — Progress Notes (Signed)
 [facility] Progress Note   Assessment/ Plan:   Dialysis Orders:  [facility] ([address]) (last dialyzed there on 9/9)  MWF 3:45  450/800 EDW 61 kg 2K/2.5Ca  -Heparin  1000 units bolus + 600 units/hr  -Tums 1500 q HD, calcitriol  1 mcg q HD, Sensipar  60 q HD      Assessment/Plan: Hyperglycemia. Uncontrolled T1DM. Recently admitted with DKA. Insulin  gtt started in ED, now off. Abdominal pain. Large ascites, hepatomegaly on Abd CT.  ESRD: Usual MWF schedule. HD 10/7, then back to regular schedule Hyperkalemia:  Will correct with HD, also with insulin  administration.  HTN/volume: Hypertensive with volume overload on admit. Should improve some with HD.  Anemia:Hgb 10-11 - monitor for now Secondary HPTH: Ca ok, Phos high - continue home calcitriol  and binders once eeating.  HFrEF (30-35%): Continue to optimize volume with HD   Subjective:    Seen and examined.  Feeling a little better.  Reports she is still having ongoing issues with gastroparesis   Objective:   BP (!) 168/77   Pulse (!) 104   Temp 98.1 F (36.7 C)   Resp 16   Ht 5' 3 (1.6 m)   Wt 60.3 kg   SpO2 96%   BMI 23.56 kg/m  No intake or output data in the 24 hours ending 02/04/24 1102 Weight change:   Physical Exam: Gen:NAD CVS: RRR Resp: clear Abd: distended + ascites Ext: no LE edema  Imaging: CT ABDOMEN PELVIS WO CONTRAST Result Date: 02/03/2024 CLINICAL DATA:  Abdominal pain. EXAM: CT ABDOMEN AND PELVIS WITHOUT CONTRAST TECHNIQUE: Multidetector CT imaging of the abdomen and pelvis was performed following the standard protocol without IV contrast. RADIATION DOSE REDUCTION: This exam was performed according to the departmental dose-optimization program which includes automated exposure control, adjustment of the mA and/or kV according to patient size and/or use of iterative reconstruction technique. COMPARISON:  01/15/2024. FINDINGS: Lower chest: Septal thickening and ground-glass in the  lung bases. Small to moderate right pleural effusion. Heart is enlarged. Trace pericardial fluid may be physiologic. Distal esophagus is grossly unremarkable. Hepatobiliary: Liver is enlarged, 21.4 cm. Liver is otherwise grossly unremarkable. Cholecystectomy. No biliary ductal dilatation. Pancreas: Grossly unremarkable. Spleen: Negative. Adrenals/Urinary Tract: Adrenal glands and kidneys are grossly unremarkable. Ureters are decompressed. Bladder is grossly unremarkable. Stomach/Bowel: Stomach, small bowel, appendix and colon are unremarkable. Vascular/Lymphatic: Vascular structures are unremarkable. Scattered abdominal peritoneal ligament and retroperitoneal lymph nodes are not enlarged by CT size criteria. Reproductive: Uterus is visualized.  No adnexal mass. Other: Large ascites.  Omental congestion/edema. Musculoskeletal: None. IMPRESSION: 1. Large ascites. 2. Congestive heart failure. 3. Hepatomegaly. Electronically Signed   By: Newell Eke M.D.   On: 02/03/2024 13:50   DG Chest 2 View Result Date: 02/03/2024 CLINICAL DATA:  Chest and abdominal pain. EXAM: CHEST - 2 VIEW COMPARISON:  Radiograph 01/26/2024 FINDINGS: Low lung volumes. Cardiomegaly is stable. Unchanged mediastinal contours. Central vascular congestion. Mild atelectasis at both lung bases, left greater than right. No confluent opacity. No pleural effusion or pneumothorax. Vascular stent in the right proximal arm. No acute osseous findings. IMPRESSION: 1. Low lung volumes with mild bibasilar atelectasis. 2. Stable cardiomegaly. Central vascular congestion. Electronically Signed   By: Andrea Gasman M.D.   On: 02/03/2024 12:19    Labs: BMET Recent Labs  Lab 01/28/24 2329 01/29/24 0746 01/29/24 1302 01/30/24 0258 01/31/24 0809 02/03/24 1100 02/03/24 1113 02/04/24 0440  NA 136 136 134* 135 133* 134* 132* 136  K 4.1 4.2 4.7 4.5  4.7 5.9* 5.8* 5.5*  CL 96* 91* 95* 98 97* 99  --  101  CO2 26 27 21* 25 24 22   --  22  GLUCOSE 101*  142* 164* 158* 193* 539*  --  146*  BUN 33* 34* 35* 41* 30* 55*  --  58*  CREATININE 5.64* 5.78* 6.05* 6.61* 5.43* 8.20*  --  8.45*  CALCIUM  8.2* 8.6* 8.1* 8.1* 8.0* 8.7*  --  8.4*   CBC Recent Labs  Lab 01/29/24 1302 01/30/24 0258 01/31/24 0809 02/03/24 1100 02/03/24 1113 02/04/24 0440  WBC 11.3* 13.1* 11.4* 13.7*  --  11.6*  NEUTROABS 7.4  --   --  7.6  --   --   HGB 10.3* 9.9* 10.3* 10.7* 11.9* 9.7*  HCT 34.0* 32.6* 34.2* 35.6* 35.0* 32.1*  MCV 90.4 89.6 90.0 89.4  --  88.4  PLT 180 214 216 327  --  310    Medications:     bumetanide   10 mg Oral Daily   calcitRIOL   1.25 mcg Oral Q M,W,F   carvedilol   25 mg Oral BID WC   Chlorhexidine  Gluconate Cloth  6 each Topical Q0600   DULoxetine   20 mg Oral Daily   heparin   5,000 Units Subcutaneous BID   insulin  aspart  0-9 Units Subcutaneous Q4H   insulin  glargine-yfgn  5 Units Subcutaneous Daily   lamoTRIgine   200 mg Oral Daily   lipase/protease/amylase  12,000 Units Oral TID AC   OLANZapine  zydis  5 mg Oral QHS   pantoprazole   40 mg Oral Daily   pantoprazole  (PROTONIX ) IV  40 mg Intravenous Q12H   rosuvastatin   40 mg Oral Daily   sodium bicarbonate   650 mg Oral BID   sodium chloride  flush  3 mL Intravenous Q12H   sodium chloride  flush  3 mL Intravenous Q12H    [name] MD 02/04/2024, 11:02 AM

## 2024-02-04 NOTE — ED Notes (Signed)
 Current BG 133, admitting MD orders for CBG Q2H, if BG over 200, order for lantus 

## 2024-02-05 ENCOUNTER — Inpatient Hospital Stay (HOSPITAL_COMMUNITY)

## 2024-02-05 ENCOUNTER — Encounter (HOSPITAL_COMMUNITY): Payer: Self-pay | Admitting: Internal Medicine

## 2024-02-05 DIAGNOSIS — E875 Hyperkalemia: Secondary | ICD-10-CM | POA: Diagnosis not present

## 2024-02-05 DIAGNOSIS — Z992 Dependence on renal dialysis: Secondary | ICD-10-CM

## 2024-02-05 DIAGNOSIS — R0789 Other chest pain: Secondary | ICD-10-CM | POA: Diagnosis not present

## 2024-02-05 DIAGNOSIS — N186 End stage renal disease: Secondary | ICD-10-CM | POA: Diagnosis not present

## 2024-02-05 DIAGNOSIS — E8770 Fluid overload, unspecified: Secondary | ICD-10-CM | POA: Diagnosis not present

## 2024-02-05 DIAGNOSIS — N289 Disorder of kidney and ureter, unspecified: Secondary | ICD-10-CM

## 2024-02-05 DIAGNOSIS — I10 Essential (primary) hypertension: Secondary | ICD-10-CM

## 2024-02-05 LAB — CBC WITH DIFFERENTIAL/PLATELET
Abs Immature Granulocytes: 0.04 K/uL (ref 0.00–0.07)
Basophils Absolute: 0.1 K/uL (ref 0.0–0.1)
Basophils Relative: 1 %
Eosinophils Absolute: 0.5 K/uL (ref 0.0–0.5)
Eosinophils Relative: 5 %
HCT: 31.6 % — ABNORMAL LOW (ref 36.0–46.0)
Hemoglobin: 9.6 g/dL — ABNORMAL LOW (ref 12.0–15.0)
Immature Granulocytes: 0 %
Lymphocytes Relative: 40 %
Lymphs Abs: 3.6 K/uL (ref 0.7–4.0)
MCH: 27.4 pg (ref 26.0–34.0)
MCHC: 30.4 g/dL (ref 30.0–36.0)
MCV: 90 fL (ref 80.0–100.0)
Monocytes Absolute: 0.8 K/uL (ref 0.1–1.0)
Monocytes Relative: 9 %
Neutro Abs: 4 K/uL (ref 1.7–7.7)
Neutrophils Relative %: 45 %
Platelets: 279 K/uL (ref 150–400)
RBC: 3.51 MIL/uL — ABNORMAL LOW (ref 3.87–5.11)
RDW: 14.6 % (ref 11.5–15.5)
WBC: 9 K/uL (ref 4.0–10.5)
nRBC: 0 % (ref 0.0–0.2)

## 2024-02-05 LAB — GLUCOSE, CAPILLARY
Glucose-Capillary: 103 mg/dL — ABNORMAL HIGH (ref 70–99)
Glucose-Capillary: 146 mg/dL — ABNORMAL HIGH (ref 70–99)
Glucose-Capillary: 215 mg/dL — ABNORMAL HIGH (ref 70–99)
Glucose-Capillary: 230 mg/dL — ABNORMAL HIGH (ref 70–99)
Glucose-Capillary: 130 mg/dL — ABNORMAL HIGH (ref 70–99)

## 2024-02-05 MED ORDER — CALCITRIOL 0.25 MCG PO CAPS
ORAL_CAPSULE | ORAL | Status: AC
  Filled 2024-02-05: qty 1

## 2024-02-05 MED ORDER — HYDROXYZINE HCL 25 MG PO TABS
25.0000 mg | ORAL_TABLET | Freq: Three times a day (TID) | ORAL | Status: DC | PRN
  Administered 2024-02-05 – 2024-02-07 (×2): 25 mg via ORAL
  Filled 2024-02-05: qty 1

## 2024-02-05 MED ORDER — MORPHINE SULFATE (PF) 2 MG/ML IV SOLN
INTRAVENOUS | Status: AC
  Filled 2024-02-05: qty 1

## 2024-02-05 MED ORDER — MELATONIN 3 MG PO TABS
3.0000 mg | ORAL_TABLET | Freq: Once | ORAL | Status: AC
  Administered 2024-02-05: 3 mg via ORAL
  Filled 2024-02-05: qty 1

## 2024-02-05 MED ORDER — SUCRALFATE 1 GM/10ML PO SUSP
1.0000 g | Freq: Two times a day (BID) | ORAL | Status: DC
  Administered 2024-02-05 – 2024-02-07 (×4): 1 g via ORAL
  Filled 2024-02-05 (×4): qty 10

## 2024-02-05 MED ORDER — TECHNETIUM TO 99M ALBUMIN AGGREGATED
3.2900 | Freq: Once | INTRAVENOUS | Status: AC | PRN
  Administered 2024-02-05: 3.92 via INTRAVENOUS

## 2024-02-05 MED ORDER — CALCITRIOL 0.5 MCG PO CAPS
ORAL_CAPSULE | ORAL | Status: AC
  Filled 2024-02-05: qty 2

## 2024-02-05 NOTE — Plan of Care (Signed)
  Problem: Coping: Goal: Ability to adjust to condition or change in health will improve Outcome: Progressing   Problem: Health Behavior/Discharge Planning: Goal: Ability to identify and utilize available resources and services will improve Outcome: Progressing   Problem: Health Behavior/Discharge Planning: Goal: Ability to manage health-related needs will improve Outcome: Progressing

## 2024-02-05 NOTE — Progress Notes (Signed)
 [facility]  PROGRESS NOTE  INDONESIA MCKEOUGH FMW:[id number] DOB: 10-Jan-1991 DOA: 02/03/2024 PCP: Keven Crumbly Pap, MD   Brief HPI:    33 y.o. female with past medical history  of  HTN, HLD, ESRD on HD (MWF), HFrEF, uncontrolled DM type I, bipolar disorder, and noncompliance with multiple admissions for DKA and/or fluid overload presents with multiple complaints of chest pain, abdominal pain and generalized weakness.  Similar to her previous presentations patient seen and left AMA on October 3.   Admitted for management  of volume overload, ascites,  hyperglycemia and abdominal pain.    Assessment/Plan:   >>Abdominal pain: Secondary to large ascites, ?gastroparesis -IR paracentesis done yesterday, 3.1 L of straw-colored fluid obtained -symptoms management  patient has had an endoscopy on 31 January 2024 for her nausea vomiting and dysphagia, showing LA grade D esophagitis with no bleeding, large amount of food residue in the stomach and a normal antrum normal duodenum and pylorus.  -CT abd pelvis wo on 10/6  today showing large ascites congestive heart failure, hepatomegaly. -morphine  prn -Will start Carafate  1 g p.o. twice daily as per GI recommendation.  Will check with nephrology   >>Chest pain: Likely from CHF/volume overload. Troponins are negative, EKG is showing mild T wave inversions in inferior leads which we will follow along with the troponins. Patient has had upper GI evaluation that has showed esophagitis.   CXR with pulm congestion, cardiomegaly VQ scan is negative HD per nephro -Will start Carafate  as above   >> Hyperkalemia: Patient on dextrose , insulin , lokelma . HD  -Nephrology following -Check BMP in a.m.   >> Diabetes mellitus type 1: Hyperglycemia, in the ED patient started on insulin  drip, will manage patient on every 4 hours sliding scale and every 4 hours Accu-Cheks.  Add lantus  5U daily   >> End-stage renal disease on Monday Wednesday  Friday hemodialysis: Nephrology on board, HD run today as well.    >> Anemia: Hemoglobin stable at 11.9 and will follow.  Type and screen.   >> Acute on chronic chronic HFrEF: Volume management with hemodialysis, patient currently has a rales and ascites.  Continue Entresto.   >> Essential hypertension: Bumex , entresto, coreg     Medications     bumetanide   10 mg Oral Daily   calcitRIOL   1.25 mcg Oral Q M,W,F   carvedilol   25 mg Oral BID WC   DULoxetine   20 mg Oral Daily   heparin   5,000 Units Subcutaneous BID   insulin  aspart  0-9 Units Subcutaneous Q4H   insulin  glargine  5 Units Subcutaneous Daily   lamoTRIgine   200 mg Oral Daily   lidocaine  (PF)  10 mL Infiltration Once   lipase/protease/amylase  12,000 Units Oral TID AC   OLANZapine  zydis  5 mg Oral QHS   pantoprazole   40 mg Oral Daily   rosuvastatin   40 mg Oral Daily   sodium bicarbonate   650 mg Oral BID   sodium chloride  flush  3 mL Intravenous Q12H   sucralfate   1 g Oral BID     Data Reviewed:   CBG:  Recent Labs  Lab 02/04/24 2102 02/05/24 0009 02/05/24 0437 02/05/24 0739 02/05/24 1628  GLUCAP 126* 103* 146* 215* 230*    SpO2: 93 %    Vitals:   02/05/24 1213 02/05/24 1216 02/05/24 1429 02/05/24 1626  BP: (!) 166/91 (!) 177/67 (!) 153/107 (!) 121/52  Pulse: 83 82 85 96  Resp: 17 17 18    Temp:  98.1 F (36.7 C)  97.8 F (36.6 C) 98.6 F (37 C)  TempSrc:   Oral Oral  SpO2: 93% 93% 95% 93%  Weight:  (S) 60.2 kg    Height:          Data Reviewed:  Basic Metabolic Panel: Recent Labs  Lab 01/30/24 0258 01/31/24 0809 02/03/24 1100 02/03/24 1113 02/04/24 0440  NA 135 133* 134* 132* 136  K 4.5 4.7 5.9* 5.8* 5.5*  CL 98 97* 99  --  101  CO2 25 24 22   --  22  GLUCOSE 158* 193* 539*  --  146*  BUN 41* 30* 55*  --  58*  CREATININE 6.61* 5.43* 8.20*  --  8.45*  CALCIUM  8.1* 8.0* 8.7*  --  8.4*  MG 2.5* 2.7*  --   --  2.9*    CBC: Recent Labs  Lab 01/30/24 0258 01/31/24 0809  02/03/24 1100 02/03/24 1113 02/04/24 0440 02/05/24 0732  WBC 13.1* 11.4* 13.7*  --  11.6* 9.0  NEUTROABS  --   --  7.6  --   --  4.0  HGB 9.9* 10.3* 10.7* 11.9* 9.7* 9.6*  HCT 32.6* 34.2* 35.6* 35.0* 32.1* 31.6*  MCV 89.6 90.0 89.4  --  88.4 90.0  PLT 214 216 327  --  310 279    LFT Recent Labs  Lab 01/30/24 0258 01/31/24 0809 02/03/24 1100 02/04/24 0440  AST 50* 33 74* 79*  ALT 24 24 66* 55*  ALKPHOS 469* 507* 655* 545*  BILITOT 0.6 0.8 0.9 0.9  PROT 4.5* 5.0* 6.3* 4.8*  ALBUMIN  2.2* 2.5* 3.2* 2.4*     Antibiotics: Anti-infectives (From admission, onward)    None        DVT prophylaxis: Heparin   Code Status: Full code  Family Communication: No family at bedside   CONSULTS nephrology   Subjective   Still complains of chest discomfort   Objective    Physical Examination:   General-appears in no acute distress Heart-S1-S2, regular, no murmur auscultated Lungs-clear to auscultation bilaterally, no wheezing or crackles auscultated Abdomen-soft, nontender, no organomegaly Extremities-no edema in the lower extremities Neuro-alert, oriented x3, no focal deficit noted  Status is: Inpatient:             Roman Sandall S Juanpablo Ciresi   [facility] If 7PM-7AM, please contact night-coverage at [email or web address], Office  [phone]   02/05/2024, 5:13 PM  LOS: 2 days

## 2024-02-05 NOTE — Progress Notes (Signed)
 [facility] Progress Note   Assessment/ Plan:   Dialysis Orders:  [facility] ([address]) (last dialyzed there on 9/9)  MWF 3:45  450/800 EDW 61 kg 2K/2.5Ca  -Heparin  1000 units bolus + 600 units/hr  -Tums 1500 q HD, calcitriol  1 mcg q HD, Sensipar  60 q HD      Assessment/Plan: Hyperglycemia. Uncontrolled T1DM. Recently admitted with DKA. Insulin  gtt started in ED, now off. Abdominal pain. Large ascites, hepatomegaly on Abd CT. S/p 3.1L paracentesis 02/04/24 ESRD: Usual MWF schedule. HD 10/7, then back to regular schedule today 10/8 Hyperkalemia:  Will correct with HD, also with insulin  administration.  HTN/volume: Hypertensive with volume overload on admit. Should improve some with HD.  Anemia:Hgb 10-11 - monitor for now Secondary HPTH: Ca ok, Phos high - continue home calcitriol  and binders once eeating.  HFrEF (30-35%): Continue to optimize volume with HD   Subjective:    Seen and examined.  Feeling a little better. Had paracentesis yesterday with 3.1L off.     Objective:   BP (!) 154/69   Pulse 83   Temp (!) 97.5 F (36.4 C)   Resp 12   Ht 5' 3 (1.6 m)   Wt 61.8 kg   SpO2 95%   BMI 24.13 kg/m   Intake/Output Summary (Last 24 hours) at 02/05/2024 1116 Last data filed at 02/04/2024 1839 Gross per 24 hour  Intake 25.16 ml  Output 3000 ml  Net -2974.84 ml   Weight change: 1.772 kg  Physical Exam: Gen:NAD CVS: RRR Resp: clear Abd: distended + ascites Ext: no LE edema  Imaging: DG CHEST PORT 1 VIEW Result Date: 02/04/2024 EXAM: 1 VIEW(S) XRAY OF THE CHEST 02/04/2024 03:55:00 PM COMPARISON: 02/03/2024 CLINICAL HISTORY: Pulmonary embolism FINDINGS: LINES, TUBES AND DEVICES: Right axillary vascular stent noted. LUNGS AND PLEURA: Low lung volumes. Hazy bibasilar opacities. No pleural effusion. No pneumothorax. HEART AND MEDIASTINUM: Stable cardiomegaly. Unchanged mediastinal contours. BONES AND SOFT TISSUES: No acute osseous abnormality.  IMPRESSION: 1. Unchanged cardiomegaly. Hazy bibasilar opacities may represent edema. Electronically signed by: Andrea Gasman MD 02/04/2024 08:15 PM EDT RP Workstation: [id number]   IR Paracentesis Result Date: 02/04/2024 INDICATION: 33 year old female. History of end-stage renal disease on hemodialysis with recurrent ascites. Request is for therapeutic paracentesis EXAM: ULTRASOUND GUIDED THERAPEUTIC PARACENTESIS MEDICATIONS: Lidocaine  1% 10 mL COMPLICATIONS: None immediate. PROCEDURE: Informed written consent was obtained from the patient after a discussion of the risks, benefits and alternatives to treatment. A timeout was performed prior to the initiation of the procedure. Initial ultrasound scanning demonstrates a moderate amount of ascites within the right lower abdominal quadrant. The right lower abdomen was prepped and draped in the usual sterile fashion. 1% lidocaine  was used for local anesthesia. Following this, a 6 Fr Safe-T-Centesis catheter was introduced. An ultrasound image was saved for documentation purposes. The paracentesis was performed. The catheter was removed and a dressing was applied. The patient tolerated the procedure well without immediate post procedural complication. FINDINGS: A total of approximately 3.1 L of straw-colored fluid was removed. IMPRESSION: Successful ultrasound-guided right-sided paracentesis yielding 3.1 liters of straw-colored peritoneal fluid. Performed by Delon Beagle NP PLAN: If the patient eventually requires >/=2 paracenteses in a 30 day period, candidacy for formal evaluation by the [facility] Hypert[facility] will be assessed. Electronically Signed   By: Juliene Balder M.D.   On: 02/04/2024 15:39   CT ABDOMEN PELVIS WO CONTRAST Result Date: 02/03/2024 CLINICAL DATA:  Abdominal pain. EXAM: CT ABDOMEN AND PELVIS WITHOUT  CONTRAST TECHNIQUE: Multidetector CT imaging of the abdomen and pelvis was performed following the  standard protocol without IV contrast. RADIATION DOSE REDUCTION: This exam was performed according to the departmental dose-optimization program which includes automated exposure control, adjustment of the mA and/or kV according to patient size and/or use of iterative reconstruction technique. COMPARISON:  01/15/2024. FINDINGS: Lower chest: Septal thickening and ground-glass in the lung bases. Small to moderate right pleural effusion. Heart is enlarged. Trace pericardial fluid may be physiologic. Distal esophagus is grossly unremarkable. Hepatobiliary: Liver is enlarged, 21.4 cm. Liver is otherwise grossly unremarkable. Cholecystectomy. No biliary ductal dilatation. Pancreas: Grossly unremarkable. Spleen: Negative. Adrenals/Urinary Tract: Adrenal glands and kidneys are grossly unremarkable. Ureters are decompressed. Bladder is grossly unremarkable. Stomach/Bowel: Stomach, small bowel, appendix and colon are unremarkable. Vascular/Lymphatic: Vascular structures are unremarkable. Scattered abdominal peritoneal ligament and retroperitoneal lymph nodes are not enlarged by CT size criteria. Reproductive: Uterus is visualized.  No adnexal mass. Other: Large ascites.  Omental congestion/edema. Musculoskeletal: None. IMPRESSION: 1. Large ascites. 2. Congestive heart failure. 3. Hepatomegaly. Electronically Signed   By: Newell Eke M.D.   On: 02/03/2024 13:50   DG Chest 2 View Result Date: 02/03/2024 CLINICAL DATA:  Chest and abdominal pain. EXAM: CHEST - 2 VIEW COMPARISON:  Radiograph 01/26/2024 FINDINGS: Low lung volumes. Cardiomegaly is stable. Unchanged mediastinal contours. Central vascular congestion. Mild atelectasis at both lung bases, left greater than right. No confluent opacity. No pleural effusion or pneumothorax. Vascular stent in the right proximal arm. No acute osseous findings. IMPRESSION: 1. Low lung volumes with mild bibasilar atelectasis. 2. Stable cardiomegaly. Central vascular congestion.  Electronically Signed   By: Andrea Gasman M.D.   On: 02/03/2024 12:19    Labs: BMET Recent Labs  Lab 01/29/24 1302 01/30/24 0258 01/31/24 0809 02/03/24 1100 02/03/24 1113 02/04/24 0440  NA 134* 135 133* 134* 132* 136  K 4.7 4.5 4.7 5.9* 5.8* 5.5*  CL 95* 98 97* 99  --  101  CO2 21* 25 24 22   --  22  GLUCOSE 164* 158* 193* 539*  --  146*  BUN 35* 41* 30* 55*  --  58*  CREATININE 6.05* 6.61* 5.43* 8.20*  --  8.45*  CALCIUM  8.1* 8.1* 8.0* 8.7*  --  8.4*   CBC Recent Labs  Lab 01/29/24 1302 01/30/24 0258 01/31/24 0809 02/03/24 1100 02/03/24 1113 02/04/24 0440 02/05/24 0732  WBC 11.3*   < > 11.4* 13.7*  --  11.6* 9.0  NEUTROABS 7.4  --   --  7.6  --   --  4.0  HGB 10.3*   < > 10.3* 10.7* 11.9* 9.7* 9.6*  HCT 34.0*   < > 34.2* 35.6* 35.0* 32.1* 31.6*  MCV 90.4   < > 90.0 89.4  --  88.4 90.0  PLT 180   < > 216 327  --  310 279   < > = values in this interval not displayed.    Medications:     bumetanide   10 mg Oral Daily   calcitRIOL   1.25 mcg Oral Q M,W,F   carvedilol   25 mg Oral BID WC   DULoxetine   20 mg Oral Daily   heparin   5,000 Units Subcutaneous BID   insulin  aspart  0-9 Units Subcutaneous Q4H   insulin  glargine  5 Units Subcutaneous Daily   lamoTRIgine   200 mg Oral Daily   lidocaine  (PF)  10 mL Infiltration Once   lipase/protease/amylase  12,000 Units Oral TID AC   OLANZapine  zydis  5 mg Oral QHS   pantoprazole   40 mg Oral Daily   rosuvastatin   40 mg Oral Daily   sodium bicarbonate   650 mg Oral BID   sodium chloride  flush  3 mL Intravenous Q12H    [name] MD 02/05/2024, 11:16 AM

## 2024-02-05 NOTE — Procedures (Signed)
 Patient seen and examined on Hemodialysis. The procedure was supervised and I have made appropriate changes. BP (!) 154/69   Pulse 83   Temp (!) 97.5 F (36.4 C)   Resp 12   Ht 5' 3 (1.6 m)   Wt 61.8 kg   SpO2 95%   BMI 24.13 kg/m   QB 400 mL/min via TDC, UF goal 3L  Tolerating treatment without complaints at this time.   [name] MD [facility] [phone] 11:17 AM

## 2024-02-05 NOTE — Progress Notes (Signed)
   02/05/24 1216  Vitals  Temp 98.1 F (36.7 C)  Pulse Rate 82  Resp 17  BP (!) 177/67  SpO2 93 %  O2 Device Room Air  Weight (S)  60.2 kg (Bed Scale)  Type of Weight Post-Dialysis  Oxygen Therapy  Patient Activity (if Appropriate) In bed  Pulse Oximetry Type Continuous  Oximetry Probe Site Changed No  Post Treatment  Dialyzer Clearance Lightly streaked  Hemodialysis Intake (mL) 0 mL  Liters Processed 84  Fluid Removed (mL) 3000 mL  Tolerated HD Treatment Yes   Received patient in bed to unit.  Alert and oriented.  Informed consent signed and in chart.   TX duration:3.5 hours  Patient tolerated well.  Transported back to the room  Alert, without acute distress.  Hand-off given to patient's nurse.   Access used: Yes Access issues: No  Total UF removed: 3000 Medication(s) given: See MAR Post HD VS: See Above Grid Post HD weight: 60.2 kg   Zebedee DELENA [facility]

## 2024-02-06 DIAGNOSIS — R0789 Other chest pain: Secondary | ICD-10-CM | POA: Diagnosis not present

## 2024-02-06 DIAGNOSIS — E875 Hyperkalemia: Secondary | ICD-10-CM | POA: Diagnosis not present

## 2024-02-06 DIAGNOSIS — E8770 Fluid overload, unspecified: Secondary | ICD-10-CM | POA: Diagnosis not present

## 2024-02-06 DIAGNOSIS — N186 End stage renal disease: Secondary | ICD-10-CM | POA: Diagnosis not present

## 2024-02-06 LAB — BASIC METABOLIC PANEL WITH GFR
Anion gap: 14 (ref 5–15)
BUN: 24 mg/dL — ABNORMAL HIGH (ref 6–20)
CO2: 22 mmol/L (ref 22–32)
Calcium: 8.1 mg/dL — ABNORMAL LOW (ref 8.9–10.3)
Chloride: 100 mmol/L (ref 98–111)
Creatinine, Ser: 4.29 mg/dL — ABNORMAL HIGH (ref 0.44–1.00)
GFR, Estimated: 13 mL/min — ABNORMAL LOW (ref >60)
Glucose, Bld: 183 mg/dL — ABNORMAL HIGH (ref 70–99)
Potassium: 4.9 mmol/L (ref 3.5–5.1)
Sodium: 136 mmol/L (ref 135–145)

## 2024-02-06 LAB — GLUCOSE, CAPILLARY
Glucose-Capillary: 177 mg/dL — ABNORMAL HIGH (ref 70–99)
Glucose-Capillary: 182 mg/dL — ABNORMAL HIGH (ref 70–99)
Glucose-Capillary: 208 mg/dL — ABNORMAL HIGH (ref 70–99)
Glucose-Capillary: 211 mg/dL — ABNORMAL HIGH (ref 70–99)
Glucose-Capillary: 216 mg/dL — ABNORMAL HIGH (ref 70–99)
Glucose-Capillary: 269 mg/dL — ABNORMAL HIGH (ref 70–99)

## 2024-02-06 MED ORDER — CHLORHEXIDINE GLUCONATE CLOTH 2 % EX PADS
6.0000 | MEDICATED_PAD | Freq: Every day | CUTANEOUS | Status: DC
  Administered 2024-02-06 – 2024-02-07 (×2): 6 via TOPICAL

## 2024-02-06 MED ORDER — SEVELAMER CARBONATE 800 MG PO TABS
800.0000 mg | ORAL_TABLET | Freq: Three times a day (TID) | ORAL | Status: DC
  Administered 2024-02-06 – 2024-02-07 (×4): 800 mg via ORAL
  Filled 2024-02-06 (×4): qty 1

## 2024-02-06 MED ORDER — METOCLOPRAMIDE HCL 5 MG/ML IJ SOLN
5.0000 mg | Freq: Four times a day (QID) | INTRAMUSCULAR | Status: DC
  Administered 2024-02-06 – 2024-02-07 (×4): 5 mg via INTRAVENOUS
  Filled 2024-02-06 (×4): qty 2

## 2024-02-06 NOTE — Progress Notes (Signed)
 [facility] Progress Note   Assessment/ Plan:   Dialysis Orders:  [facility] ([address]) (last dialyzed there on 9/9)  MWF 3:45  450/800 EDW 61 kg 2K/2.5Ca  -Heparin  1000 units bolus + 600 units/hr  -Tums 1500 q HD, calcitriol  1 mcg q HD, Sensipar  60 q HD      Assessment/Plan: Hyperglycemia. Uncontrolled T1DM. Recently admitted with DKA. Insulin  gtt started in ED, now off. Abdominal pain. Large ascites, hepatomegaly on Abd CT. S/p 3.1L paracentesis 02/04/24. GI starting Carafate  for esophagitis/reflux.  ESRD: Usual MWF schedule. Back on schedule. HD 10/10 Hyperkalemia:  Will correct with HD, also with insulin  administration.  HTN/volume: Hypertensive with volume overload on admit. Should improve some with HD.  Anemia:Hgb 10-11 - monitor for now Secondary HPTH: Ca ok, Phos high - Resume calcitriol , Renvela  binder  HFrEF (30-35%): Continue to optimize volume with HD   Subjective:    Seen and examined.  Feeling a little better. Eating small amounts now. Difficult to swallow.     Objective:   BP (!) 146/74 (BP Location: Right Arm)   Pulse 97   Temp 98.3 F (36.8 C)   Resp 19   Ht 5' 3 (1.6 m)   Wt 55.8 kg   SpO2 100%   BMI 21.79 kg/m   Intake/Output Summary (Last 24 hours) at 02/06/2024 1004 Last data filed at 02/06/2024 0100 Gross per 24 hour  Intake 240 ml  Output 3000 ml  Net -2760 ml   Weight change: -0.3 kg  Physical Exam: Gen:NAD CVS: RRR Resp: clear Abd: distended + ascites Ext: no LE edema  Imaging: NM Pulmonary Perfusion Result Date: 02/05/2024 EXAM: NM Lung Perfusion Scan. CLINICAL HISTORY: Pulmonary embolism (PE) suspected, high prob. Patient has been experiencing SOB and coughing. TECHNIQUE: Radiolabeled MAA was administered intravenously and planar images of the lungs were obtained in multiple projections. RADIOPHARMACEUTICAL: 3.92 mCi Tc7m MAA. COMPARISON: Radiograph 02/04/2024. FINDINGS: PERFUSION: No wedge-shaped peripheral  perfusion defect within left or right lung to suggest acute pulmonary embolism. Normal perfusion pattern. IMPRESSION: 1. No perfusion defects to indicate pulmonary embolism. Electronically signed by: Norleen Boxer MD 02/05/2024 04:45 PM EDT RP Workstation: [id number]   DG CHEST PORT 1 VIEW Result Date: 02/04/2024 EXAM: 1 VIEW(S) XRAY OF THE CHEST 02/04/2024 03:55:00 PM COMPARISON: 02/03/2024 CLINICAL HISTORY: Pulmonary embolism FINDINGS: LINES, TUBES AND DEVICES: Right axillary vascular stent noted. LUNGS AND PLEURA: Low lung volumes. Hazy bibasilar opacities. No pleural effusion. No pneumothorax. HEART AND MEDIASTINUM: Stable cardiomegaly. Unchanged mediastinal contours. BONES AND SOFT TISSUES: No acute osseous abnormality. IMPRESSION: 1. Unchanged cardiomegaly. Hazy bibasilar opacities may represent edema. Electronically signed by: Andrea Gasman MD 02/04/2024 08:15 PM EDT RP Workstation: [id number]   IR Paracentesis Result Date: 02/04/2024 INDICATION: 33 year old female. History of end-stage renal disease on hemodialysis with recurrent ascites. Request is for therapeutic paracentesis EXAM: ULTRASOUND GUIDED THERAPEUTIC PARACENTESIS MEDICATIONS: Lidocaine  1% 10 mL COMPLICATIONS: None immediate. PROCEDURE: Informed written consent was obtained from the patient after a discussion of the risks, benefits and alternatives to treatment. A timeout was performed prior to the initiation of the procedure. Initial ultrasound scanning demonstrates a moderate amount of ascites within the right lower abdominal quadrant. The right lower abdomen was prepped and draped in the usual sterile fashion. 1% lidocaine  was used for local anesthesia. Following this, a 6 Fr Safe-T-Centesis catheter was introduced. An ultrasound image was saved for documentation purposes. The paracentesis was performed. The catheter was removed and a dressing was applied. The patient tolerated the procedure  well without immediate post procedural  complication. FINDINGS: A total of approximately 3.1 L of straw-colored fluid was removed. IMPRESSION: Successful ultrasound-guided right-sided paracentesis yielding 3.1 liters of straw-colored peritoneal fluid. Performed by Delon Beagle NP PLAN: If the patient eventually requires >/=2 paracenteses in a 30 day period, candidacy for formal evaluation by the [facility] Interventional Radiology Portal Hypertension Clinic will be assessed. Electronically Signed   By: Juliene Balder M.D.   On: 02/04/2024 15:39    Labs: BMET Recent Labs  Lab 01/31/24 0809 02/03/24 1100 02/03/24 1113 02/04/24 0440 02/06/24 0159  NA 133* 134* 132* 136 136  K 4.7 5.9* 5.8* 5.5* 4.9  CL 97* 99  --  101 100  CO2 24 22  --  22 22  GLUCOSE 193* 539*  --  146* 183*  BUN 30* 55*  --  58* 24*  CREATININE 5.43* 8.20*  --  8.45* 4.29*  CALCIUM  8.0* 8.7*  --  8.4* 8.1*   CBC Recent Labs  Lab 01/31/24 0809 02/03/24 1100 02/03/24 1113 02/04/24 0440 02/05/24 0732  WBC 11.4* 13.7*  --  11.6* 9.0  NEUTROABS  --  7.6  --   --  4.0  HGB 10.3* 10.7* 11.9* 9.7* 9.6*  HCT 34.2* 35.6* 35.0* 32.1* 31.6*  MCV 90.0 89.4  --  88.4 90.0  PLT 216 327  --  310 279    Medications:     bumetanide   10 mg Oral Daily   calcitRIOL   1.25 mcg Oral Q M,W,F   carvedilol   25 mg Oral BID WC   DULoxetine   20 mg Oral Daily   heparin   5,000 Units Subcutaneous BID   insulin  aspart  0-9 Units Subcutaneous Q4H   insulin  glargine  5 Units Subcutaneous Daily   lamoTRIgine   200 mg Oral Daily   lidocaine  (PF)  10 mL Infiltration Once   lipase/protease/amylase  12,000 Units Oral TID AC   OLANZapine  zydis  5 mg Oral QHS   pantoprazole   40 mg Oral Daily   rosuvastatin   40 mg Oral Daily   sodium bicarbonate   650 mg Oral BID   sodium chloride  flush  3 mL Intravenous Q12H   sucralfate   1 g Oral BID   Maisie Ronnald Acosta PA-C [facility] 02/06/2024,10:04 AM

## 2024-02-06 NOTE — Progress Notes (Signed)
 [facility]  PROGRESS NOTE  Ashley Freeman FMW:[id number] DOB: [birth year] DOA: 02/03/2024 PCP: Keven Crumbly Pap, MD   Brief HPI:    33 y.o. female with past medical history  of  HTN, HLD, ESRD on HD (MWF), HFrEF, uncontrolled DM type I, bipolar disorder, and noncompliance with multiple admissions for DKA and/or fluid overload presents with multiple complaints of chest pain, abdominal pain and generalized weakness.  Similar to her previous presentations patient seen and left AMA on October 3.   Admitted for management  of volume overload, ascites,  hyperglycemia and abdominal pain.    Assessment/Plan:   >>Abdominal pain: Secondary to large ascites, ?gastroparesis -IR paracentesis done yesterday, 3.1 L of straw-colored fluid obtained -symptoms management  patient has had an endoscopy on 31 January 2024 for her nausea vomiting and dysphagia, showing LA grade D esophagitis with no bleeding, large amount of food residue in the stomach and a normal antrum normal duodenum and pylorus.  -CT abd pelvis wo on 10/6  today showing large ascites congestive heart failure, hepatomegaly. -morphine  prn -Will start Carafate  1 g p.o. twice daily as per GI recommendation.   - Will start Reglan  5 mg IV every 6 hours   >>Chest pain: -Improved after starting Carafate  Likely from CHF/volume overload. Troponins are negative, EKG is showing mild T wave inversions in inferior leads which we will follow along with the troponins. Patient has had upper GI evaluation that has showed esophagitis.   CXR with pulm congestion, cardiomegaly VQ scan is negative HD per nephro    >> Hyperkalemia: Patient on dextrose , insulin , lokelma . HD  -Nephrology following -Check BMP in a.m.   >> Diabetes mellitus type 1: Hyperglycemia, in the ED patient started on insulin  drip, will manage patient on every 4 hours sliding scale and every 4 hours Accu-Cheks.  Added lantus  5U daily   >> End-stage renal  disease on Monday Wednesday Friday hemodialysis: Nephrology on board, HD run today as well.    >> Anemia: Hemoglobin stable at 11.9 and will follow.  Type and screen.   >> Acute on chronic chronic HFrEF: Volume management with hemodialysis, patient currently has a rales and ascites.  Continue Entresto.   >> Essential hypertension: Bumex , entresto, coreg     Medications     bumetanide   10 mg Oral Daily   calcitRIOL   1.25 mcg Oral Q M,W,F   carvedilol   25 mg Oral BID WC   DULoxetine   20 mg Oral Daily   heparin   5,000 Units Subcutaneous BID   insulin  aspart  0-9 Units Subcutaneous Q4H   insulin  glargine  5 Units Subcutaneous Daily   lamoTRIgine   200 mg Oral Daily   lidocaine  (PF)  10 mL Infiltration Once   lipase/protease/amylase  12,000 Units Oral TID AC   OLANZapine  zydis  5 mg Oral QHS   pantoprazole   40 mg Oral Daily   rosuvastatin   40 mg Oral Daily   sodium bicarbonate   650 mg Oral BID   sodium chloride  flush  3 mL Intravenous Q12H   sucralfate   1 g Oral BID     Data Reviewed:   CBG:  Recent Labs  Lab 02/05/24 0739 02/05/24 1628 02/05/24 2027 02/06/24 0008 02/06/24 0448  GLUCAP 215* 230* 130* 182* 211*    SpO2: 94 %    Vitals:   02/05/24 1429 02/05/24 1626 02/05/24 2028 02/06/24 0445  BP: (!) 153/107 (!) 121/52 (!) 149/81 (!) 142/82  Pulse: 85 96 90 83  Resp: 18  18 17  Temp: 97.8 F (36.6 C) 98.6 F (37 C) 98.4 F (36.9 C) 98 F (36.7 C)  TempSrc: Oral Oral  Oral  SpO2: 95% 93% 92% 94%  Weight:    55.8 kg  Height:          Data Reviewed:  Basic Metabolic Panel: Recent Labs  Lab 01/31/24 0809 02/03/24 1100 02/03/24 1113 02/04/24 0440 02/06/24 0159  NA 133* 134* 132* 136 136  K 4.7 5.9* 5.8* 5.5* 4.9  CL 97* 99  --  101 100  CO2 24 22  --  22 22  GLUCOSE 193* 539*  --  146* 183*  BUN 30* 55*  --  58* 24*  CREATININE 5.43* 8.20*  --  8.45* 4.29*  CALCIUM  8.0* 8.7*  --  8.4* 8.1*  MG 2.7*  --   --  2.9*  --     CBC: Recent Labs   Lab 01/31/24 0809 02/03/24 1100 02/03/24 1113 02/04/24 0440 02/05/24 0732  WBC 11.4* 13.7*  --  11.6* 9.0  NEUTROABS  --  7.6  --   --  4.0  HGB 10.3* 10.7* 11.9* 9.7* 9.6*  HCT 34.2* 35.6* 35.0* 32.1* 31.6*  MCV 90.0 89.4  --  88.4 90.0  PLT 216 327  --  310 279    LFT Recent Labs  Lab 01/31/24 0809 02/03/24 1100 02/04/24 0440  AST 33 74* 79*  ALT 24 66* 55*  ALKPHOS 507* 655* 545*  BILITOT 0.8 0.9 0.9  PROT 5.0* 6.3* 4.8*  ALBUMIN  2.5* 3.2* 2.4*     Antibiotics: Anti-infectives (From admission, onward)    None        DVT prophylaxis: Heparin   Code Status: Full code  Family Communication: No family at bedside   CONSULTS nephrology   Subjective   Feels better than yesterday.  Still has abdominal discomfort   Objective    Physical Examination:   General-appears in no acute distress Heart-S1-S2, regular, no murmur auscultated Lungs-clear to auscultation bilaterally, no wheezing or crackles auscultated Abdomen-soft, mild tenderness in epigastric region Extremities-no edema in the lower extremities Neuro-alert, oriented x3, no focal deficit noted  Status is: Inpatient:             Ashley Freeman Ashley Freeman   [facility] If 7PM-7AM, please contact night-coverage at [email or web address], Office  [phone]   02/06/2024, 8:21 AM  LOS: 3 days

## 2024-02-06 NOTE — TOC CM/SW Note (Signed)
 Transition of Care [facility]) - Inpatient Brief Assessment   Patient Details  Name: Ashley Freeman MRN: [id number] Date of Birth: November 17, 1990  Transition of Care [facility]) CM/SW Contact:    Tom-Johnson, Harvest Muskrat, RN Phone Number: 02/06/2024, 2:02 PM   Clinical Narrative:  Patient presented to the ED with Chest pain, Abdominal pain and Generalized Weakness. Patient was recently seen in the Hospital and left AMA on 01/31/24. CT Abdomen/Pelvis showed large Ascites Congestive Heart Failure Hepatomegaly. Chest X-ray showed low Lung volumes stable Cardiomegaly and Bibasilar Atelectasis and Central Vascular Congestion. Underwent therapeutic Paracentesis, yielding 3.1 Liters of straw colored fluid on 02/04/24. Patient has hx of ESRD on MWF schedule, HTN, HLD, HFrEF, T1DM, Bipolar Disorder.  Nephrology following for iHD.    CM spoke with patient at bedside about needs for post hospital transition. Patient states she lives with her mother and her daughter. Independent with her care. Not, employed, on disability. Has a  RW at home. Does not drive, patient's aunt or medicaid transportation transports to and from outpatient HD.  PCP is [name], [name], MD and uses Psychologist, forensic at [employer].  No ICM needs or recommendations noted at this time.  Patient not Medically ready for discharge.  CM will continue to follow as patient progresses with care towards discharge.        Transition of Care Asessment: Insurance and Status: Insurance coverage has been reviewed Patient has primary care physician: Yes Home environment has been reviewed: Yes Prior level of function:: Independent Prior/Current Home Services: No current home services Social Drivers of Health Review: SDOH reviewed no interventions necessary Readmission risk has been reviewed: Yes Transition of care needs: no transition of care needs at this time

## 2024-02-06 NOTE — Progress Notes (Signed)
 Mobility Specialist Progress Note:   02/06/24 1030  Mobility  Activity Ambulated independently  Level of Assistance Independent  Assistive Device None  Distance Ambulated (ft) 500 ft  Activity Response Tolerated well  Mobility Referral Yes  Mobility visit 1 Mobility  Mobility Specialist Start Time (ACUTE ONLY) 1030  Mobility Specialist Stop Time (ACUTE ONLY) 1041  Mobility Specialist Time Calculation (min) (ACUTE ONLY) 11 min   Pt ambulated at an independent level with no AD in hallway. C/o persistent chest discomfort. No other c/o. Pt left with all needs met.   Therisa Rana Mobility Specialist Please contact via SecureChat or  Rehab office at [phone]

## 2024-02-07 ENCOUNTER — Other Ambulatory Visit (HOSPITAL_COMMUNITY): Payer: Self-pay

## 2024-02-07 DIAGNOSIS — R1084 Generalized abdominal pain: Secondary | ICD-10-CM

## 2024-02-07 DIAGNOSIS — E1049 Type 1 diabetes mellitus with other diabetic neurological complication: Secondary | ICD-10-CM

## 2024-02-07 DIAGNOSIS — N186 End stage renal disease: Secondary | ICD-10-CM | POA: Diagnosis not present

## 2024-02-07 DIAGNOSIS — R0789 Other chest pain: Secondary | ICD-10-CM | POA: Diagnosis not present

## 2024-02-07 LAB — RENAL FUNCTION PANEL
Albumin: 2.2 g/dL — ABNORMAL LOW (ref 3.5–5.0)
Anion gap: 11 (ref 5–15)
BUN: 37 mg/dL — ABNORMAL HIGH (ref 6–20)
CO2: 23 mmol/L (ref 22–32)
Calcium: 8.3 mg/dL — ABNORMAL LOW (ref 8.9–10.3)
Chloride: 104 mmol/L (ref 98–111)
Creatinine, Ser: 5.84 mg/dL — ABNORMAL HIGH (ref 0.44–1.00)
GFR, Estimated: 9 mL/min — ABNORMAL LOW (ref >60)
Glucose, Bld: 199 mg/dL — ABNORMAL HIGH (ref 70–99)
Phosphorus: 4.8 mg/dL — ABNORMAL HIGH (ref 2.5–4.6)
Potassium: 5.6 mmol/L — ABNORMAL HIGH (ref 3.5–5.1)
Sodium: 138 mmol/L (ref 135–145)

## 2024-02-07 LAB — CBC
HCT: 32.1 % — ABNORMAL LOW (ref 36.0–46.0)
Hemoglobin: 9.7 g/dL — ABNORMAL LOW (ref 12.0–15.0)
MCH: 27.2 pg (ref 26.0–34.0)
MCHC: 30.2 g/dL (ref 30.0–36.0)
MCV: 90.2 fL (ref 80.0–100.0)
Platelets: 273 K/uL (ref 150–400)
RBC: 3.56 MIL/uL — ABNORMAL LOW (ref 3.87–5.11)
RDW: 14.3 % (ref 11.5–15.5)
WBC: 12.2 K/uL — ABNORMAL HIGH (ref 4.0–10.5)
nRBC: 0 % (ref 0.0–0.2)

## 2024-02-07 LAB — GLUCOSE, CAPILLARY
Glucose-Capillary: 124 mg/dL — ABNORMAL HIGH (ref 70–99)
Glucose-Capillary: 138 mg/dL — ABNORMAL HIGH (ref 70–99)
Glucose-Capillary: 195 mg/dL — ABNORMAL HIGH (ref 70–99)
Glucose-Capillary: 216 mg/dL — ABNORMAL HIGH (ref 70–99)
Glucose-Capillary: 247 mg/dL — ABNORMAL HIGH (ref 70–99)

## 2024-02-07 MED ORDER — LIDOCAINE-PRILOCAINE 2.5-2.5 % EX CREA: 1.0000 | TOPICAL_CREAM | CUTANEOUS | Status: DC | PRN

## 2024-02-07 MED ORDER — LANTUS SOLOSTAR 100 UNIT/ML ~~LOC~~ SOPN
10.0000 [IU] | PEN_INJECTOR | Freq: Every day | SUBCUTANEOUS | 1 refills | Status: DC
  Filled 2024-02-07: qty 3, 30d supply, fill #0

## 2024-02-07 MED ORDER — CALCITRIOL 0.25 MCG PO CAPS
ORAL_CAPSULE | ORAL | Status: AC
  Filled 2024-02-07: qty 1

## 2024-02-07 MED ORDER — HEPARIN SODIUM (PORCINE) 1000 UNIT/ML IJ SOLN
INTRAMUSCULAR | Status: AC
  Filled 2024-02-07: qty 2

## 2024-02-07 MED ORDER — SUCRALFATE 1 GM/10ML PO SUSP
1.0000 g | Freq: Two times a day (BID) | ORAL | 0 refills | Status: DC
  Filled 2024-02-07: qty 280, 14d supply, fill #0

## 2024-02-07 MED ORDER — HEPARIN SODIUM (PORCINE) 1000 UNIT/ML DIALYSIS
1000.0000 [IU] | INTRAMUSCULAR | Status: DC | PRN
Start: 2024-02-07 — End: 2024-02-07

## 2024-02-07 MED ORDER — LIDOCAINE HCL (PF) 1 % IJ SOLN: 5.0000 mL | INTRAMUSCULAR | Status: DC | PRN

## 2024-02-07 MED ORDER — PANTOPRAZOLE SODIUM 40 MG PO TBEC
40.0000 mg | DELAYED_RELEASE_TABLET | Freq: Two times a day (BID) | ORAL | 0 refills | Status: DC
  Filled 2024-02-07: qty 120, 60d supply, fill #0

## 2024-02-07 MED ORDER — PENTAFLUOROPROP-TETRAFLUOROETH EX AERO: 1.0000 | INHALATION_SPRAY | CUTANEOUS | Status: DC | PRN

## 2024-02-07 MED ORDER — ANTICOAGULANT SODIUM CITRATE 4% (200MG/5ML) IV SOLN: 5.0000 mL | Status: DC | PRN

## 2024-02-07 MED ORDER — SEVELAMER CARBONATE 800 MG PO TABS
800.0000 mg | ORAL_TABLET | Freq: Three times a day (TID) | ORAL | 2 refills | Status: AC
  Filled 2024-02-07: qty 90, 30d supply, fill #0

## 2024-02-07 MED ORDER — HYDROXYZINE HCL 25 MG PO TABS
ORAL_TABLET | ORAL | Status: AC
  Filled 2024-02-07: qty 1

## 2024-02-07 MED ORDER — HEPARIN SODIUM (PORCINE) 1000 UNIT/ML DIALYSIS
2000.0000 [IU] | INTRAMUSCULAR | Status: DC | PRN
Start: 2024-02-07 — End: 2024-02-07
  Administered 2024-02-07: 2000 [IU] via INTRAVENOUS_CENTRAL

## 2024-02-07 MED ORDER — ALTEPLASE 2 MG IJ SOLR: 2.0000 mg | Freq: Once | INTRAMUSCULAR | Status: DC | PRN

## 2024-02-07 MED ORDER — METOCLOPRAMIDE HCL 10 MG PO TABS
5.0000 mg | ORAL_TABLET | Freq: Three times a day (TID) | ORAL | 0 refills | Status: DC | PRN
  Filled 2024-02-07: qty 15, 10d supply, fill #0

## 2024-02-07 MED ORDER — MORPHINE SULFATE (PF) 2 MG/ML IV SOLN
INTRAVENOUS | Status: AC
  Filled 2024-02-07: qty 1

## 2024-02-07 MED ORDER — CALCITRIOL 0.5 MCG PO CAPS
ORAL_CAPSULE | ORAL | Status: AC
  Filled 2024-02-07: qty 2

## 2024-02-07 NOTE — Progress Notes (Addendum)
 Received patient in bed to unit.  Alert and oriented.  Informed consent signed and in chart.   TX duration:3.5 hours  Patient had cramps with 6 more minutes left, but finished with UF off Transported back to the room  Alert, without acute distress.  Hand-off given to patient's nurse.   Access used: Right upper arm graft Access issues: none  Total UF removed: 2.8L Medication(s) given: morphine , atarax , calcitriol    02/07/24 1218  Vitals  Temp 98.5 F (36.9 C)  BP 128/85  Pulse Rate 89  ECG Heart Rate 90  Resp (!) 26  Weight 60.6 kg  Type of Weight Post-Dialysis  Oxygen Therapy  SpO2 97 %  O2 Device Room Air  During Treatment Monitoring  HD Safety Checks Performed Yes  Intra-Hemodialysis Comments Tx completed  Dialysis Fluid Bolus Normal Saline  Bolus Amount (mL) 100 mL  Post Treatment  Dialyzer Clearance Clear  Liters Processed 84  Fluid Removed (mL) 2800 mL  Tolerated HD Treatment No (Comment)  Post-Hemodialysis Comments Patient cramped at the end of session  AVG/AVF Arterial Site Held (minutes) 7 minutes  AVG/AVF Venous Site Held (minutes) 7 minutes  Fistula / Graft Right Upper arm Arteriovenous vein graft  Placement Date/Time: 07/06/22 1013   Placed prior to admission: No  Orientation: Right  Access Location: Upper arm  Access Type: Arteriovenous vein graft  Site Condition No complications  Fistula / Graft Assessment Present;Thrill;Bruit  Status Patent;Deaccessed  Drainage Description None     Camellia Brasil LPN [facility]

## 2024-02-07 NOTE — Procedures (Signed)
 Patient seen and examined on Hemodialysis. The procedure was supervised and I have made appropriate changes. BP (!) 174/82   Pulse 89   Temp 98.4 F (36.9 C) (Oral)   Resp 19   Ht 5' 3 (1.6 m)   Wt 60.8 kg   SpO2 97%   BMI 23.74 kg/m   QB 400 mL/ min TDC, UF goal 3L  Tolerating treatment without complaints at this time.   [name] MD [facility] [phone] 10:41 AM

## 2024-02-07 NOTE — Discharge Summary (Signed)
 Physician Discharge Summary   Patient: Ashley Freeman MRN: [id number] DOB: [birth year]  Admit date:     02/03/2024  Discharge date: 02/07/24  Discharge Physician: Sabas GORMAN Brod   PCP: Keven Crumbly Pap, MD   Recommendations at discharge:   Follow-up PCP in 2 weeks  Discharge Diagnoses: Principal Problem:   Chest pain Active Problems:   Hypervolemia associated with renal insufficiency   Hyperkalemia   Anemia due to chronic kidney disease   ESRD on dialysis (HCC)   Chronic combined systolic and diastolic heart failure (HCC)   Essential hypertension   Bipolar disorder (HCC)   Hyperglycemia   Diabetic neuropathy (HCC)   Diabetic retinopathy (HCC)   Anasarca   Generalized abdominal pain  Resolved Problems:   * No resolved hospital problems. *  Hospital Course:  33 y.o. female with past medical history  of  HTN, HLD, ESRD on HD (MWF), HFrEF, uncontrolled DM type I, bipolar disorder, and noncompliance with multiple admissions for DKA and/or fluid overload presents with multiple complaints of chest pain, abdominal pain and generalized weakness.  Similar to her previous presentations patient seen and left AMA on October 3.   Admitted for management  of volume overload, ascites,  hyperglycemia and abdominal pain.  Assessment and Plan:  >>Abdominal pain: Secondary to large ascites, ?gastroparesis -IR paracentesis done yesterday, 3.1 L of straw-colored fluid obtained -symptoms management  patient has had an endoscopy on 31 January 2024 for her nausea vomiting and dysphagia, showing LA grade D esophagitis with no bleeding, large amount of food residue in the stomach and a normal antrum normal duodenum and pylorus.  -CT abd pelvis wo on 10/6  today showing large ascites congestive heart failure, hepatomegaly. -morphine  prn -Will start Carafate  1 g p.o. twice daily as per GI recommendation.  She will get Carafate  for 2 weeks.  Confirmed with nephrology. - Will start Reglan   5 mg p.o. every 8 hours as needed   >>Chest pain: -Improved after starting Carafate  Likely from CHF/volume overload. Troponins are negative, EKG is showing mild T wave inversions in inferior leads which we will follow along with the troponins. Patient has had upper GI evaluation that has showed esophagitis.   CXR with pulm congestion, cardiomegaly VQ scan is negative HD per nephro -Chest pain has improved after starting Carafate  as above     >> Hyperkalemia: Underwent hemodialysis yesterday   >> Diabetes mellitus type 1: Continue home regimen  >> End-stage renal disease on Monday Wednesday Friday hemodialysis: Nephrology on board, hemodialysis as outpatient   >> Anemia: Hemoglobin stable at 11.9 and will follow.  Type and screen.   >> Acute on chronic chronic HFrEF: Volume management with hemodialysis, patient currently has a rales and ascites.  Continue Entresto.   >> Essential hypertension: Bumex , entresto, coreg         Consultants: Nephrology Procedures performed:   Disposition: Home Diet recommendation:  Discharge Diet Orders (From admission, onward)     Start     Ordered   02/07/24 0000  Diet - low sodium heart healthy        02/07/24 1352           Regular diet DISCHARGE MEDICATION: Allergies as of 02/07/2024       Reactions   Keflex  [cephalexin ] Anaphylaxis   Ceftriaxone  in the past with no reaction   Penicillins Anaphylaxis, Hives, Rash   Vibramycin  [doxycycline ] Anaphylaxis   Benadryl  [diphenhydramine ] Itching   Dilaudid  [hydromorphone ] Itching   Methotrexate And Trimetrexate Rash  Roxicodone  [oxycodone ] Itching   Takes Percocet without issue        Medication List     STOP taking these medications    famotidine  20 MG tablet Commonly known as: PEPCID    omeprazole  40 MG capsule Commonly known as: PRILOSEC Replaced by: pantoprazole  40 MG tablet   ondansetron  4 MG disintegrating tablet Commonly known as: ZOFRAN -ODT        TAKE these medications    albuterol  108 (90 Base) MCG/ACT inhaler Commonly known as: VENTOLIN  HFA Inhale 2 puffs into the lungs every 4 (four) hours as needed for wheezing or shortness of breath.   bumetanide  2 MG tablet Commonly known as: BUMEX  Take 10 mg by mouth daily.   calcitRIOL  0.25 MCG capsule Commonly known as: ROCALTROL  Take 5 capsules (1.25 mcg total) by mouth every Tuesday, Thursday, and Saturday at 6 PM. What changed: when to take this   carvedilol  25 MG tablet Commonly known as: COREG  Take 25 mg by mouth 2 (two) times daily with a meal.   DULoxetine  20 MG capsule Commonly known as: CYMBALTA  Take 20 mg by mouth daily.   Entresto 24-26 MG Generic drug: sacubitril-valsartan Take 1 tablet by mouth 2 (two) times daily.   fluticasone  50 MCG/ACT nasal spray Commonly known as: FLONASE  Place 2 sprays into both nostrils daily.   hydrOXYzine  25 MG tablet Commonly known as: ATARAX  Take 25 mg by mouth 3 (three) times daily as needed for anxiety, itching, nausea or vomiting.   ketoconazole  2 % cream Commonly known as: NIZORAL  Apply 1 Application topically 2 (two) times daily. Apply 1gm twice daily to affected skin on feet   lamoTRIgine  200 MG tablet Commonly known as: LAMICTAL  Take 1 tablet (200 mg total) by mouth daily.   Lantus  SoloStar 100 UNIT/ML Solostar Pen Generic drug: insulin  glargine Inject 10 Units into the skin daily.   metoCLOPramide  5 MG tablet Commonly known as: Reglan  Take 1 tablet (5 mg total) by mouth every 8 (eight) hours as needed for nausea or refractory nausea / vomiting.   NovoLOG  FlexPen 100 UNIT/ML FlexPen Generic drug: insulin  aspart INJECT 5 UNITS UNDER THE SKIN 3 TIMES A DAY BEFORE MEALS. IF YOU DO NOT EAT A MEAL, PLEASE CHEECK YOUR BLOOD SUGAR AND GIVE YOURSELF 1 UNIT OF INSULIN  FOR EVERY 50MG /DL GREATER THAN 869. MAX 30 UNITS PER DAY.   OLANZapine  zydis 5 MG disintegrating tablet Commonly known as: ZYPREXA  Take 5 mg by mouth  at bedtime.   oxyCODONE -acetaminophen  5-325 MG tablet Commonly known as: PERCOCET/ROXICET Take 1 tablet by mouth every 4 (four) hours as needed for moderate pain (pain score 4-6).   Pancrelipase  (Lip-Prot-Amyl) 3000-9500 units Cpep Take 3,000 units of lipase by mouth 3 (three) times daily before meals.   pantoprazole  40 MG tablet Commonly known as: PROTONIX  Take 1 tablet (40 mg total) by mouth 2 (two) times daily. Replaces: omeprazole  40 MG capsule   prochlorperazine  5 MG tablet Commonly known as: COMPAZINE  Take 5 mg by mouth every 6 (six) hours as needed for nausea or vomiting.   rosuvastatin  40 MG tablet Commonly known as: CRESTOR  Take 40 mg by mouth daily.   sevelamer  carbonate 800 MG tablet Commonly known as: RENVELA  Take 1 tablet (800 mg total) by mouth 3 (three) times daily with meals.   sodium bicarbonate  650 MG tablet Take 650 mg by mouth 2 (two) times daily.   sucralfate  1 GM/10ML suspension Commonly known as: CARAFATE  Take 10 mLs (1 g total) by mouth 2 (two) times  daily for 14 days.   SUMAtriptan  50 MG tablet Commonly known as: IMITREX  Take 50 mg by mouth every 2 (two) hours as needed for migraine or headache.        Follow-up Information     [name], [name], MD Follow up in 2 week(s).   Specialty: Family Medicine Contact information: [address] [place] [address] [phone]                Discharge Exam: [name]   02/07/24 0500 02/07/24 9185 02/07/24 1218  Weight: 58.9 kg 60.8 kg 60.6 kg   Appears in no acute distress S1-S2, regular, no murmur auscultated Lungs are clear to auscultation bilaterally Abdomen is soft, nontender, no organomegaly  Condition at discharge: good  The results of significant diagnostics from this hospitalization (including imaging, microbiology, ancillary and laboratory) are listed below for reference.   Imaging Studies: NM Pulmonary Perfusion Result Date: 02/05/2024 EXAM: NM Lung Perfusion Scan.  CLINICAL HISTORY: Pulmonary embolism (PE) suspected, high prob. Patient has been experiencing SOB and coughing. TECHNIQUE: Radiolabeled MAA was administered intravenously and planar images of the lungs were obtained in multiple projections. RADIOPHARMACEUTICAL: 3.92 mCi Tc15m MAA. COMPARISON: Radiograph 02/04/2024. FINDINGS: PERFUSION: No wedge-shaped peripheral perfusion defect within left or right lung to suggest acute pulmonary embolism. Normal perfusion pattern. IMPRESSION: 1. No perfusion defects to indicate pulmonary embolism. Electronically signed by: Norleen Boxer MD 02/05/2024 04:45 PM EDT RP Workstation: [id number]   DG CHEST PORT 1 VIEW Result Date: 02/04/2024 EXAM: 1 VIEW(S) XRAY OF THE CHEST 02/04/2024 03:55:00 PM COMPARISON: 02/03/2024 CLINICAL HISTORY: Pulmonary embolism FINDINGS: LINES, TUBES AND DEVICES: Right axillary vascular stent noted. LUNGS AND PLEURA: Low lung volumes. Hazy bibasilar opacities. No pleural effusion. No pneumothorax. HEART AND MEDIASTINUM: Stable cardiomegaly. Unchanged mediastinal contours. BONES AND SOFT TISSUES: No acute osseous abnormality. IMPRESSION: 1. Unchanged cardiomegaly. Hazy bibasilar opacities may represent edema. Electronically signed by: Andrea Gasman MD 02/04/2024 08:15 PM EDT RP Workstation: [id number]   IR Paracentesis Result Date: 02/04/2024 INDICATION: 33 year old female. History of end-stage renal disease on hemodialysis with recurrent ascites. Request is for therapeutic paracentesis EXAM: ULTRASOUND GUIDED THERAPEUTIC PARACENTESIS MEDICATIONS: Lidocaine  1% 10 mL COMPLICATIONS: None immediate. PROCEDURE: Informed written consent was obtained from the patient after a discussion of the risks, benefits and alternatives to treatment. A timeout was performed prior to the initiation of the procedure. Initial ultrasound scanning demonstrates a moderate amount of ascites within the right lower abdominal quadrant. The right lower abdomen was prepped and  draped in the usual sterile fashion. 1% lidocaine  was used for local anesthesia. Following this, a 6 Fr Safe-T-Centesis catheter was introduced. An ultrasound image was saved for documentation purposes. The paracentesis was performed. The catheter was removed and a dressing was applied. The patient tolerated the procedure well without immediate post procedural complication. FINDINGS: A total of approximately 3.1 L of straw-colored fluid was removed. IMPRESSION: Successful ultrasound-guided right-sided paracentesis yielding 3.1 liters of straw-colored peritoneal fluid. Performed by Delon Beagle NP PLAN: If the patient eventually requires >/=2 paracenteses in a 30 day period, candidacy for formal evaluation by the [facility] will be assessed. Electronically Signed   By: Juliene Balder M.D.   On: 02/04/2024 15:39   CT ABDOMEN PELVIS WO CONTRAST Result Date: 02/03/2024 CLINICAL DATA:  Abdominal pain. EXAM: CT ABDOMEN AND PELVIS WITHOUT CONTRAST TECHNIQUE: Multidetector CT imaging of the abdomen and pelvis was performed following the standard protocol without IV contrast. RADIATION DOSE REDUCTION: This exam was  performed according to the departmental dose-optimization program which includes automated exposure control, adjustment of the mA and/or kV according to patient size and/or use of iterative reconstruction technique. COMPARISON:  01/15/2024. FINDINGS: Lower chest: Septal thickening and ground-glass in the lung bases. Small to moderate right pleural effusion. Heart is enlarged. Trace pericardial fluid may be physiologic. Distal esophagus is grossly unremarkable. Hepatobiliary: Liver is enlarged, 21.4 cm. Liver is otherwise grossly unremarkable. Cholecystectomy. No biliary ductal dilatation. Pancreas: Grossly unremarkable. Spleen: Negative. Adrenals/Urinary Tract: Adrenal glands and kidneys are grossly unremarkable. Ureters are decompressed. Bladder is grossly  unremarkable. Stomach/Bowel: Stomach, small bowel, appendix and colon are unremarkable. Vascular/Lymphatic: Vascular structures are unremarkable. Scattered abdominal peritoneal ligament and retroperitoneal lymph nodes are not enlarged by CT size criteria. Reproductive: Uterus is visualized.  No adnexal mass. Other: Large ascites.  Omental congestion/edema. Musculoskeletal: None. IMPRESSION: 1. Large ascites. 2. Congestive heart failure. 3. Hepatomegaly. Electronically Signed   By: Newell Eke M.D.   On: 02/03/2024 13:50   DG Chest 2 View Result Date: 02/03/2024 CLINICAL DATA:  Chest and abdominal pain. EXAM: CHEST - 2 VIEW COMPARISON:  Radiograph 01/26/2024 FINDINGS: Low lung volumes. Cardiomegaly is stable. Unchanged mediastinal contours. Central vascular congestion. Mild atelectasis at both lung bases, left greater than right. No confluent opacity. No pleural effusion or pneumothorax. Vascular stent in the right proximal arm. No acute osseous findings. IMPRESSION: 1. Low lung volumes with mild bibasilar atelectasis. 2. Stable cardiomegaly. Central vascular congestion. Electronically Signed   By: Andrea Gasman M.D.   On: 02/03/2024 12:19   DG Chest Portable 1 View Result Date: 01/26/2024 EXAM: 1 VIEW(S) XRAY OF THE CHEST 01/26/2024 12:45:00 PM COMPARISON: 01/24/2024 CLINICAL HISTORY: weakness. Reason for exam:weakness; Triage notes: Pt presents to ED via EMS from home with complaint of vomiting black stuff, no black stool began last night. Weakness. Dialyzed yesterday, missed an appointment or two due to transportation issues. FINDINGS: LUNGS AND PLEURA: Low lung volumes. No focal pulmonary opacity. No pulmonary edema. No pleural effusion. No pneumothorax. HEART AND MEDIASTINUM: Persistent cardiomegaly. No acute abnormality of the mediastinal silhouette. BONES AND SOFT TISSUES: No acute osseous abnormality. IMPRESSION: 1. No acute cardiopulmonary process identified. Electronically signed by: Waddell Calk MD 01/26/2024 01:06 PM EDT RP Workstation: [id number]   DG Chest 2 View Result Date: 01/24/2024 CLINICAL DATA:  Shortness of breath.  Missed hemodialysis EXAM: CHEST - 2 VIEW COMPARISON:  01/15/2024 FINDINGS: Shallow inspiration. Interstitial infiltrates with mild bronchiectasis in the right lung base, unchanged. No developing consolidation or edema. No pleural effusion or pneumothorax. Mediastinal contours appear intact. Mild cardiac enlargement. IMPRESSION: Similar appearance to previous studies with mild cardiac enlargement and bronchiectasis with mild interstitial infiltrates in the lung bases. Electronically Signed   By: Elsie Gravely M.D.   On: 01/24/2024 23:51   CT ABDOMEN PELVIS WO CONTRAST Result Date: 01/15/2024 CLINICAL DATA:  Abdominal pain, acute, nonlocalized generalized pain and missed dialysis. Dialysis days are MWF- states had a family emergency today. EXAM: CT ABDOMEN AND PELVIS WITHOUT CONTRAST TECHNIQUE: Multidetector CT imaging of the abdomen and pelvis was performed following the standard protocol without IV contrast. RADIATION DOSE REDUCTION: This exam was performed according to the departmental dose-optimization program which includes automated exposure control, adjustment of the mA and/or kV according to patient size and/or use of iterative reconstruction technique. COMPARISON:  CT abdomen pelvis 01/04/2024 FINDINGS: Lower chest: Interval increase in trace right pleural effusion. Bilateral lower lobe atelectasis. Enlarged cardiac silhouette. Cardiac findings consistent with anemia. Hepatobiliary: The liver  is enlarged measuring up to 21 cm. No focal liver abnormality. Status post cholecystectomy. No biliary dilatation. Pancreas: No focal lesion. Normal pancreatic contour. No surrounding inflammatory changes. No main pancreatic ductal dilatation. Spleen: Normal in size without focal abnormality. Adrenals/Urinary Tract: No adrenal nodule bilaterally. No nephrolithiasis and no  hydronephrosis. No definite contour-deforming renal mass. No ureterolithiasis or hydroureter. The urinary bladder is unremarkable. Stomach/Bowel: Stomach is within normal limits. No evidence of bowel wall thickening or dilatation. Appendix appears normal. Vascular/Lymphatic: No abdominal aorta or iliac aneurysm. Mild atherosclerotic plaque of the aorta and its branches. Similar-appearing multiple prominent retroperitoneal and pelvic lymph nodes. No abdominal, pelvic, or inguinal lymphadenopathy. Reproductive: Uterus and bilateral adnexa are unremarkable. Other: Moderate simple free fluid. No intraperitoneal free gas. No organized fluid collection. Musculoskeletal: No abdominal wall hernia or abnormality. Diffuse subcutaneus soft tissue edema. No suspicious lytic or blastic osseous lesions. No acute displaced fracture. IMPRESSION: 1. Findings suggestive of volume overload. 2. Interval increase in trace right pleural effusion. 3. Moderate volume simple ascites. 4. Diffuse subcutaneus soft tissue edema. 5. Similar-appearing multiple prominent retroperitoneal and pelvic lymph nodes. 6. Hepatomegaly. 7. Cardiomegaly. Electronically Signed   By: Morgane  Naveau M.D.   On: 01/15/2024 18:49   DG Chest 2 View Result Date: 01/15/2024 CLINICAL DATA:  shob EXAM: CHEST - 2 VIEW COMPARISON:  Chest x-ray 01/11/2024, CT chest 07/15/2023 FINDINGS: The heart and mediastinal contours are unchanged. Low lung volumes. Persistent mild interstitial airspace opacities. No pleural effusion. No pneumothorax. No acute osseous abnormality. IMPRESSION: Low lung volumes.  Persistent mild interstitial airspace opacities. Electronically Signed   By: Morgane  Naveau M.D.   On: 01/15/2024 18:15   DG Chest Portable 1 View Result Date: 01/11/2024 EXAM: 1 VIEW XRAY OF THE CHEST 01/11/2024 07:43:00 PM COMPARISON: None available. CLINICAL HISTORY: Missed dialysis. Reason for exam: Missed Dialysis, Abdominal pain, Headache. FINDINGS: LUNGS AND  PLEURA: Pulmonary vascular congestion is present. Asymmetric airspace disease is present on the right. Superimposed density of the patient's hair is present over the right hemithorax. No pleural effusion. No pneumothorax. HEART AND MEDIASTINUM: Heart is mildly enlarged. The size is exaggerated by low lung volumes. BONES AND SOFT TISSUES: No acute osseous abnormality. IMPRESSION: 1. Asymmetric airspace disease on the right. 2. Pulmonary vascular congestion. 3. Mildly enlarged heart, size exaggerated by low lung volumes. Electronically signed by: Lonni Necessary MD 01/11/2024 07:50 PM EDT RP Workstation: [id number]    Microbiology: Results for orders placed or performed during the hospital encounter of 01/26/24  Resp panel by RT-PCR (RSV, Flu A&B, Covid) Anterior Nasal Swab     Status: None   Collection Time: 01/26/24 12:24 PM   Specimen: Anterior Nasal Swab  Result Value Ref Range Status   SARS Coronavirus 2 by RT PCR NEGATIVE NEGATIVE Final   Influenza A by PCR NEGATIVE NEGATIVE Final   Influenza B by PCR NEGATIVE NEGATIVE Final    Comment: (NOTE) The Xpert Xpress SARS-CoV-2/FLU/RSV plus assay is intended as an aid in the diagnosis of influenza from Nasopharyngeal swab specimens and should not be used as a sole basis for treatment. Nasal washings and aspirates are unacceptable for Xpert Xpress SARS-CoV-2/FLU/RSV testing.  Fact Sheet for Patients: BloggerCourse.com  Fact Sheet for Healthcare Providers: SeriousBroker.it  This test is not yet approved or cleared by the [place]  FDA and has been authorized for detection and/or diagnosis of SARS-CoV-2 by FDA under an Emergency Use Authorization (EUA). This EUA will remain in effect (meaning this test can be used) for the  duration of the COVID-19 declaration under Section 564(b)(1) of the Act, 21 U.S.C. section 360bbb-3(b)(1), unless the authorization is terminated or revoked.      Resp Syncytial Virus by PCR NEGATIVE NEGATIVE Final    Comment: (NOTE) Fact Sheet for Patients: BloggerCourse.com  Fact Sheet for Healthcare Providers: SeriousBroker.it  This test is not yet approved or cleared by the [place]  FDA and has been authorized for detection and/or diagnosis of SARS-CoV-2 by FDA under an Emergency Use Authorization (EUA). This EUA will remain in effect (meaning this test can be used) for the duration of the COVID-19 declaration under Section 564(b)(1) of the Act, 21 U.S.C. section 360bbb-3(b)(1), unless the authorization is terminated or revoked.  Performed at [facility], [address], [place], [place] [address]   Blood culture (routine x 2)     Status: None   Collection Time: 01/26/24 12:35 PM   Specimen: BLOOD LEFT ARM  Result Value Ref Range Status   Specimen Description BLOOD LEFT ARM  Final   Special Requests   Final    BOTTLES DRAWN AEROBIC AND ANAEROBIC Blood Culture results may not be optimal due to an inadequate volume of blood received in culture bottles   Culture   Final    NO GROWTH 5 DAYS Performed at [facility], [address] [place], [place] [address]    Report Status 01/31/2024 FINAL  Final  Blood culture (routine x 2)     Status: None   Collection Time: 01/26/24  6:29 PM   Specimen: BLOOD LEFT HAND  Result Value Ref Range Status   Specimen Description BLOOD LEFT HAND  Final   Special Requests   Final    BOTTLES DRAWN AEROBIC AND ANAEROBIC Blood Culture adequate volume   Culture   Final    NO GROWTH 5 DAYS Performed at [facility], [address] [place], [place] [address]    Report Status 01/31/2024 FINAL  Final  MRSA Next Gen by PCR, Nasal     Status: None   Collection Time: 01/26/24  7:26 PM   Specimen: Nasal Mucosa; Nasal Swab  Result Value Ref Range Status   MRSA by PCR Next Gen NOT DETECTED NOT DETECTED Final    Comment: (NOTE) The  GeneXpert MRSA Assay (FDA approved for NASAL specimens only), is one component of a comprehensive MRSA colonization surveillance program. It is not intended to diagnose MRSA infection nor to guide or monitor treatment for MRSA infections. Test performance is not FDA approved in patients less than 82 years old. Performed at [facility], [address] [place], [place] [address]     Labs: CBC: Recent Labs  Lab 02/03/24 1100 02/03/24 1113 02/04/24 0440 02/05/24 0732 02/07/24 0820  WBC 13.7*  --  11.6* 9.0 12.2*  NEUTROABS 7.6  --   --  4.0  --   HGB 10.7* 11.9* 9.7* 9.6* 9.7*  HCT 35.6* 35.0* 32.1* 31.6* 32.1*  MCV 89.4  --  88.4 90.0 90.2  PLT 327  --  310 279 273   Basic Metabolic Panel: Recent Labs  Lab 02/03/24 1100 02/03/24 1113 02/04/24 0440 02/06/24 0159 02/07/24 0413  NA 134* 132* 136 136 138  K 5.9* 5.8* 5.5* 4.9 5.6*  CL 99  --  101 100 104  CO2 22  --  22 22 23   GLUCOSE 539*  --  146* 183* 199*  BUN 55*  --  58* 24* 37*  CREATININE 8.20*  --  8.45* 4.29* 5.84*  CALCIUM  8.7*  --  8.4* 8.1* 8.3*  MG  --   --  2.9*  --   --   PHOS  --   --   --   --  4.8*   Liver Function Tests: Recent Labs  Lab 02/03/24 1100 02/04/24 0440 02/07/24 0413  AST 74* 79*  --   ALT 66* 55*  --   ALKPHOS 655* 545*  --   BILITOT 0.9 0.9  --   PROT 6.3* 4.8*  --   ALBUMIN  3.2* 2.4* 2.2*   CBG: Recent Labs  Lab 02/06/24 2358 02/07/24 0114 02/07/24 0429 02/07/24 0741 02/07/24 1316  GLUCAP 124* 138* 216* 247* 195*    Discharge time spent: greater than 30 minutes.  Signed: Sabas GORMAN Brod, MD [facility] 02/07/2024

## 2024-02-07 NOTE — Progress Notes (Signed)
 [facility] Progress Note   Assessment/ Plan:   Dialysis Orders:  [facility] ([address]) (last dialyzed there on 9/9)  MWF 3:45  450/800 EDW 61 kg 2K/2.5Ca  -Heparin  1000 units bolus + 600 units/hr  -Tums 1500 q HD, calcitriol  1 mcg q HD, Sensipar  60 q HD      Assessment/Plan: Hyperglycemia. Uncontrolled T1DM. Recently admitted with DKA. Insulin  gtt started in ED, now off. Abdominal pain. Large ascites, hepatomegaly on Abd CT. S/p 3.1L paracentesis 02/04/24. GI starting Carafate  for esophagitis/reflux.  Recommend only BID for 2 weeks- aluminum content precludes more frequent dosing/ longer duration  ESRD: Usual MWF schedule. Back on schedule. HD 10/10 Hyperkalemia:  Resolved HTN/volume: Hypertensive with volume overload on admit. Should improve some with HD.  Anemia:Hgb 10-11 - monitor for now Secondary HPTH: Ca ok, Phos high - Resume calcitriol , Renvela  binder  HFrEF (30-35%): Continue to optimize volume with HD  Dispo: can d/c from renal perspective when appropriate  Subjective:    Seen and examined.  Feeling much better. Carafate  added per GI recommendations.     Objective:   BP (!) 174/82   Pulse 89   Temp 98.4 F (36.9 C) (Oral)   Resp 19   Ht 5' 3 (1.6 m)   Wt 60.8 kg   SpO2 97%   BMI 23.74 kg/m   Intake/Output Summary (Last 24 hours) at 02/07/2024 1038 Last data filed at 02/06/2024 2123 Gross per 24 hour  Intake 803 ml  Output 0 ml  Net 803 ml   Weight change: -2.9 kg  Physical Exam: Gen:NAD CVS: RRR Resp: clear Abd: distended + ascites Ext: no LE edema  Imaging: NM Pulmonary Perfusion Result Date: 02/05/2024 EXAM: NM Lung Perfusion Scan. CLINICAL HISTORY: Pulmonary embolism (PE) suspected, high prob. Patient has been experiencing SOB and coughing. TECHNIQUE: Radiolabeled MAA was administered intravenously and planar images of the lungs were obtained in multiple projections. RADIOPHARMACEUTICAL: 3.92 mCi Tc44m MAA. COMPARISON:  Radiograph 02/04/2024. FINDINGS: PERFUSION: No wedge-shaped peripheral perfusion defect within left or right lung to suggest acute pulmonary embolism. Normal perfusion pattern. IMPRESSION: 1. No perfusion defects to indicate pulmonary embolism. Electronically signed by: Norleen Boxer MD 02/05/2024 04:45 PM EDT RP Workstation: [id number]    Labs: BMET Recent Labs  Lab 02/03/24 1100 02/03/24 1113 02/04/24 0440 02/06/24 0159 02/07/24 0413  NA 134* 132* 136 136 138  K 5.9* 5.8* 5.5* 4.9 5.6*  CL 99  --  101 100 104  CO2 22  --  22 22 23   GLUCOSE 539*  --  146* 183* 199*  BUN 55*  --  58* 24* 37*  CREATININE 8.20*  --  8.45* 4.29* 5.84*  CALCIUM  8.7*  --  8.4* 8.1* 8.3*  PHOS  --   --   --   --  4.8*   CBC Recent Labs  Lab 02/03/24 1100 02/03/24 1113 02/04/24 0440 02/05/24 0732 02/07/24 0820  WBC 13.7*  --  11.6* 9.0 12.2*  NEUTROABS 7.6  --   --  4.0  --   HGB 10.7* 11.9* 9.7* 9.6* 9.7*  HCT 35.6* 35.0* 32.1* 31.6* 32.1*  MCV 89.4  --  88.4 90.0 90.2  PLT 327  --  310 279 273    Medications:     bumetanide   10 mg Oral Daily   calcitRIOL   1.25 mcg Oral Q M,W,F   carvedilol   25 mg Oral BID WC   Chlorhexidine  Gluconate Cloth  6 each Topical Q0600   DULoxetine   20 mg Oral Daily   heparin   5,000 Units Subcutaneous BID   insulin  aspart  0-9 Units Subcutaneous Q4H   insulin  glargine  5 Units Subcutaneous Daily   lamoTRIgine   200 mg Oral Daily   lidocaine  (PF)  10 mL Infiltration Once   lipase/protease/amylase  12,000 Units Oral TID AC   metoCLOPramide  (REGLAN ) injection  5 mg Intravenous Q6H   OLANZapine  zydis  5 mg Oral QHS   pantoprazole   40 mg Oral Daily   rosuvastatin   40 mg Oral Daily   sevelamer  carbonate  800 mg Oral TID WC   sodium bicarbonate   650 mg Oral BID   sodium chloride  flush  3 mL Intravenous Q12H   sucralfate   1 g Oral BID   [name] MD [facility] 02/07/2024,10:38 AM

## 2024-02-07 NOTE — Progress Notes (Signed)
 Late note entry 02/07/24 03:36pm  D/c noted. Contacted out-pt HD clinic, [facility], to inform of pt d/c and anticipated arrival back to clinic on Monday. D/c summary and last note have been faxed at this time. No further support needed.   Lavanda Preslea [facility] D[facility] Navigator [phone]

## 2024-02-07 NOTE — Plan of Care (Signed)

## 2024-02-07 NOTE — TOC Transition Note (Signed)
 Transition of Care [facility]) - Discharge Note   Patient Details  Name: Ashley Freeman MRN: [id number] Date of Birth: November 06, 1990  [facility] CM/SW Contact:  Tom-Johnson, Harvest Muskrat, RN Phone Number: 02/07/2024, 2:33 PM   Clinical Narrative:     Patient is scheduled for discharge today.  Readmission Risk Assessment done. Hospital f/u and discharge instructions on AVS. Prescriptions sent to [facility] pharmacy and patient will receive meds prior discharge. No ICM needs or recommendations noted. Patient states she is scheduling transportation with Medicaid to transport at discharge.  No further ICM needs noted.       Final next level of care: Home/Self Care Barriers to Discharge: Barriers Resolved   Patient Goals and CMS Choice Patient states their goals for this hospitalization and ongoing recovery are:: To return home CMS Medicare.gov Compare Post Acute Care list provided to:: Patient Choice offered to / list presented to : NA      Discharge Placement                Patient to be transferred to facility by: Will schedule with Medicaid transportation      Discharge Plan and Services Additional resources added to the After Visit Summary for                  DME Arranged: N/A DME Agency: NA       HH Arranged: NA HH Agency: NA        Social Drivers of Health (SDOH) Interventions SDOH Screenings   Food Insecurity: No Food Insecurity (02/04/2024)  Housing: Low Risk  (02/04/2024)  Transportation Needs: No Transportation Needs (02/04/2024)  Recent Concern: Transportation Needs - Unmet Transportation Needs (12/22/2023)  Utilities: Not At Risk (02/04/2024)  Depression (PHQ2-9): Medium Risk (03/02/2020)  Financial Resource Strain: Medium Risk (11/15/2023)   Received from [facility]  Physical Activity: Insufficiently Active (03/02/2022)   Received from [facility]  Social Connections: Moderately Integrated (02/04/2024)  Stress: No  Stress Concern Present (03/08/2023)   Received from [facility]  Tobacco Use: Low Risk  (02/05/2024)     Readmission Risk Interventions    02/06/2024    2:01 PM 11/28/2023   11:49 AM 10/29/2023   10:23 AM  Readmission Risk Prevention Plan  Transportation Screening Complete Complete Complete  Medication Review (RN Care Manager) Referral to Pharmacy Complete Complete  PCP or Specialist appointment within 3-5 days of discharge Complete Complete Complete  HRI or Home Care Consult Complete Complete Complete  SW Recovery Care/Counseling Consult Complete  Complete  Palliative Care Screening Not Applicable Not Applicable Not Applicable  Skilled Nursing Facility Not Applicable Not Applicable Not Applicable

## 2024-02-07 NOTE — Inpatient Diabetes Management (Signed)
 Inpatient Diabetes Program Recommendations  AACE/ADA: New Consensus Statement on Inpatient Glycemic Control (2015)  Target Ranges:  Prepandial:   less than 140 mg/dL      Peak postprandial:   less than 180 mg/dL (1-2 hours)      Critically ill patients:  140 - 180 mg/dL   Lab Results  Component Value Date   GLUCAP 247 (H) 02/07/2024   HGBA1C 10.8 (H) 12/20/2023    Latest Reference Range & Units 02/06/24 20:53 02/06/24 23:58 02/07/24 01:14 02/07/24 04:29 02/07/24 07:41  Glucose-Capillary 70 - 99 mg/dL 783 (H) 875 (H) 861 (H) 216 (H) 247 (H)  (H): Data is abnormally high Review of Glycemic Control  Diabetes history: type 1 Outpatient Diabetes medications: Lantus  10 units daily, Novolog  5 units TID with meals; if not eating, 1 units insulin  for every 50 mg/dl greater than 869 mg/dl.  Current orders for Inpatient glycemic control: Lantus  5 units daily, Novolog  0-9 units correction scale every 4 hours  Inpatient Diabetes Program Recommendations:   Noted that blood sugars have been greater than 180 mg/dl.  Recommend increasing Lantus  to 8 units daily, changing Novolog  0-9 units correction scale to TID, adding Novolog  0-5 units HS scale, and adding Novolog  3 units TID with meals if eating at least 50% of meal.   Marjorie Lunger RN BSN CDE Diabetes Coordinator Pager: [phone]  8am-5pm

## 2024-02-07 NOTE — Progress Notes (Incomplete)
 [facility]  PROGRESS NOTE  Ashley Freeman FMW:[id number] DOB: 12-14-1990 DOA: 02/03/2024 PCP: Keven Crumbly Pap, MD   Brief HPI:    33 y.o. female with past medical history  of  HTN, HLD, ESRD on HD (MWF), HFrEF, uncontrolled DM type I, bipolar disorder, and noncompliance with multiple admissions for DKA and/or fluid overload presents with multiple complaints of chest pain, abdominal pain and generalized weakness.  Similar to her previous presentations patient seen and left AMA on October 3.   Admitted for management  of volume overload, ascites,  hyperglycemia and abdominal pain.    Assessment/Plan:   >>Abdominal pain: Secondary to large ascites, ?gastroparesis -IR paracentesis done yesterday, 3.1 L of straw-colored fluid obtained -symptoms management  patient has had an endoscopy on 31 January 2024 for her nausea vomiting and dysphagia, showing LA grade D esophagitis with no bleeding, large amount of food residue in the stomach and a normal antrum normal duodenum and pylorus.  -CT abd pelvis wo on 10/6  today showing large ascites congestive heart failure, hepatomegaly. -morphine  prn -Will start Carafate  1 g p.o. twice daily as per GI recommendation.   - Will start Reglan  5 mg IV every 6 hours   >>Chest pain: -Improved after starting Carafate  Likely from CHF/volume overload. Troponins are negative, EKG is showing mild T wave inversions in inferior leads which we will follow along with the troponins. Patient has had upper GI evaluation that has showed esophagitis.   CXR with pulm congestion, cardiomegaly VQ scan is negative HD per nephro    >> Hyperkalemia: Patient on dextrose , insulin , lokelma . HD  -Nephrology following -Check BMP in a.m.   >> Diabetes mellitus type 1: Hyperglycemia, in the ED patient started on insulin  drip, will manage patient on every 4 hours sliding scale and every 4 hours Accu-Cheks.  Added lantus  5U daily   >> End-stage renal  disease on Monday Wednesday Friday hemodialysis: Nephrology on board, HD run today as well.    >> Anemia: Hemoglobin stable at 11.9 and will follow.  Type and screen.   >> Acute on chronic chronic HFrEF: Volume management with hemodialysis, patient currently has a rales and ascites.  Continue Entresto.   >> Essential hypertension: Bumex , entresto, coreg     Medications     bumetanide   10 mg Oral Daily   calcitRIOL   1.25 mcg Oral Q M,W,F   carvedilol   25 mg Oral BID WC   Chlorhexidine  Gluconate Cloth  6 each Topical Q0600   DULoxetine   20 mg Oral Daily   heparin   5,000 Units Subcutaneous BID   insulin  aspart  0-9 Units Subcutaneous Q4H   insulin  glargine  5 Units Subcutaneous Daily   lamoTRIgine   200 mg Oral Daily   lidocaine  (PF)  10 mL Infiltration Once   lipase/protease/amylase  12,000 Units Oral TID AC   metoCLOPramide  (REGLAN ) injection  5 mg Intravenous Q6H   OLANZapine  zydis  5 mg Oral QHS   pantoprazole   40 mg Oral Daily   rosuvastatin   40 mg Oral Daily   sevelamer  carbonate  800 mg Oral TID WC   sodium bicarbonate   650 mg Oral BID   sodium chloride  flush  3 mL Intravenous Q12H   sucralfate   1 g Oral BID     Data Reviewed:   CBG:  Recent Labs  Lab 02/06/24 2053 02/06/24 2358 02/07/24 0114 02/07/24 0429 02/07/24 0741  GLUCAP 216* 124* 138* 216* 247*    SpO2: 95 %    Vitals:  02/06/24 2120 02/07/24 0455 02/07/24 0500 02/07/24 0757  BP: (!) 148/115 (!) 158/71  (!) 168/81  Pulse: 81 91  97  Resp: 16 18  19   Temp: 97.8 F (36.6 C) (!) 97.3 F (36.3 C)  98.1 F (36.7 C)  TempSrc:      SpO2: 97% 90%  95%  Weight:   58.9 kg   Height:          Data Reviewed:  Basic Metabolic Panel: Recent Labs  Lab 01/31/24 0809 02/03/24 1100 02/03/24 1113 02/04/24 0440 02/06/24 0159 02/07/24 0413  NA 133* 134* 132* 136 136 138  K 4.7 5.9* 5.8* 5.5* 4.9 5.6*  CL 97* 99  --  101 100 104  CO2 24 22  --  22 22 23   GLUCOSE 193* 539*  --  146* 183* 199*   BUN 30* 55*  --  58* 24* 37*  CREATININE 5.43* 8.20*  --  8.45* 4.29* 5.84*  CALCIUM  8.0* 8.7*  --  8.4* 8.1* 8.3*  MG 2.7*  --   --  2.9*  --   --   PHOS  --   --   --   --   --  4.8*    CBC: Recent Labs  Lab 01/31/24 0809 02/03/24 1100 02/03/24 1113 02/04/24 0440 02/05/24 0732  WBC 11.4* 13.7*  --  11.6* 9.0  NEUTROABS  --  7.6  --   --  4.0  HGB 10.3* 10.7* 11.9* 9.7* 9.6*  HCT 34.2* 35.6* 35.0* 32.1* 31.6*  MCV 90.0 89.4  --  88.4 90.0  PLT 216 327  --  310 279    LFT Recent Labs  Lab 01/31/24 0809 02/03/24 1100 02/04/24 0440 02/07/24 0413  AST 33 74* 79*  --   ALT 24 66* 55*  --   ALKPHOS 507* 655* 545*  --   BILITOT 0.8 0.9 0.9  --   PROT 5.0* 6.3* 4.8*  --   ALBUMIN  2.5* 3.2* 2.4* 2.2*     Antibiotics: Anti-infectives (From admission, onward)    None        DVT prophylaxis: Heparin   Code Status: Full code  Family Communication: No family at bedside   CONSULTS nephrology   Subjective      Objective    Physical Examination:     Status is: Inpatient:             Ashley Freeman S Ashley Freeman   [facility] If 7PM-7AM, please contact night-coverage at [email or web address], Office  [phone]   02/07/2024, 7:58 AM  LOS: 4 days

## 2024-02-10 ENCOUNTER — Other Ambulatory Visit (HOSPITAL_COMMUNITY): Payer: Self-pay

## 2024-02-12 ENCOUNTER — Ambulatory Visit: Admitting: Family Medicine

## 2024-02-13 NOTE — Progress Notes (Deleted)
   LILLETTE Ileana Collet, PhD, LAT, ATC acting as a scribe for Artist Lloyd, MD.  Ashley Freeman is a 33 y.o. female who presents to [facility] today for bilat leg pain x ***. Pt locates pain to ***  Low back pain: Radiating pain: LE numbness/tingling: LE weakness: Aggravates: Treatments tried:  Pertinent review of systems: ***  Relevant historical information: ***   Exam:  There were no vitals taken for this visit. General: Well Developed, well nourished, and in no acute distress.   MSK: ***    Lab and Radiology Results No results found for this or any previous visit (from the past 72 hours). No results found.     Assessment and Plan: 33 y.o. female with ***   PDMP not reviewed this encounter. No orders of the defined types were placed in this encounter.  No orders of the defined types were placed in this encounter.    Discussed warning signs or symptoms. Please see discharge instructions. Patient expresses understanding.   ***

## 2024-02-14 ENCOUNTER — Ambulatory Visit: Admitting: Family Medicine

## 2024-02-18 ENCOUNTER — Emergency Department (HOSPITAL_COMMUNITY)
Admission: EM | Admit: 2024-02-18 | Discharge: 2024-02-18 | Disposition: A | Discharge status: Home / Self Care | Attending: Emergency Medicine | Admitting: Emergency Medicine

## 2024-02-18 ENCOUNTER — Emergency Department (HOSPITAL_COMMUNITY)

## 2024-02-18 DIAGNOSIS — N186 End stage renal disease: Secondary | ICD-10-CM | POA: Insufficient documentation

## 2024-02-18 DIAGNOSIS — Z992 Dependence on renal dialysis: Secondary | ICD-10-CM | POA: Diagnosis not present

## 2024-02-18 DIAGNOSIS — I5020 Unspecified systolic (congestive) heart failure: Secondary | ICD-10-CM | POA: Insufficient documentation

## 2024-02-18 DIAGNOSIS — E1022 Type 1 diabetes mellitus with diabetic chronic kidney disease: Secondary | ICD-10-CM | POA: Diagnosis not present

## 2024-02-18 DIAGNOSIS — E162 Hypoglycemia, unspecified: Secondary | ICD-10-CM

## 2024-02-18 DIAGNOSIS — E10649 Type 1 diabetes mellitus with hypoglycemia without coma: Secondary | ICD-10-CM | POA: Diagnosis not present

## 2024-02-18 DIAGNOSIS — R569 Unspecified convulsions: Secondary | ICD-10-CM | POA: Diagnosis present

## 2024-02-18 DIAGNOSIS — I132 Hypertensive heart and chronic kidney disease with heart failure and with stage 5 chronic kidney disease, or end stage renal disease: Secondary | ICD-10-CM | POA: Diagnosis not present

## 2024-02-18 LAB — CBC WITH DIFFERENTIAL/PLATELET
Abs Immature Granulocytes: 0.14 K/uL — ABNORMAL HIGH (ref 0.00–0.07)
Basophils Absolute: 0.1 K/uL (ref 0.0–0.1)
Basophils Relative: 1 %
Eosinophils Absolute: 0.7 K/uL — ABNORMAL HIGH (ref 0.0–0.5)
Eosinophils Relative: 4 %
HCT: 32.1 % — ABNORMAL LOW (ref 36.0–46.0)
Hemoglobin: 9.8 g/dL — ABNORMAL LOW (ref 12.0–15.0)
Immature Granulocytes: 1 %
Lymphocytes Relative: 16 %
Lymphs Abs: 2.5 K/uL (ref 0.7–4.0)
MCH: 27.3 pg (ref 26.0–34.0)
MCHC: 30.5 g/dL (ref 30.0–36.0)
MCV: 89.4 fL (ref 80.0–100.0)
Monocytes Absolute: 0.8 K/uL (ref 0.1–1.0)
Monocytes Relative: 5 %
Neutro Abs: 11 K/uL — ABNORMAL HIGH (ref 1.7–7.7)
Neutrophils Relative %: 73 %
Platelets: 390 K/uL (ref 150–400)
RBC: 3.59 MIL/uL — ABNORMAL LOW (ref 3.87–5.11)
RDW: 15.5 % (ref 11.5–15.5)
WBC: 15.2 K/uL — ABNORMAL HIGH (ref 4.0–10.5)
nRBC: 0 % (ref 0.0–0.2)

## 2024-02-18 LAB — MAGNESIUM: Magnesium: 3.2 mg/dL — ABNORMAL HIGH (ref 1.7–2.4)

## 2024-02-18 LAB — COMPREHENSIVE METABOLIC PANEL WITH GFR
ALT: 56 U/L — ABNORMAL HIGH (ref 0–44)
AST: 30 U/L (ref 15–41)
Albumin: 2.7 g/dL — ABNORMAL LOW (ref 3.5–5.0)
Alkaline Phosphatase: 559 U/L — ABNORMAL HIGH (ref 38–126)
Anion gap: 15 (ref 5–15)
BUN: 86 mg/dL — ABNORMAL HIGH (ref 6–20)
CO2: 19 mmol/L — ABNORMAL LOW (ref 22–32)
Calcium: 8.3 mg/dL — ABNORMAL LOW (ref 8.9–10.3)
Chloride: 102 mmol/L (ref 98–111)
Creatinine, Ser: 10.45 mg/dL — ABNORMAL HIGH (ref 0.44–1.00)
GFR, Estimated: 5 mL/min — ABNORMAL LOW (ref >60)
Glucose, Bld: 165 mg/dL — ABNORMAL HIGH (ref 70–99)
Potassium: 5.1 mmol/L (ref 3.5–5.1)
Sodium: 136 mmol/L (ref 135–145)
Total Bilirubin: 0.9 mg/dL (ref 0.0–1.2)
Total Protein: 5.6 g/dL — ABNORMAL LOW (ref 6.5–8.1)

## 2024-02-18 LAB — CBG MONITORING, ED
Glucose-Capillary: 108 mg/dL — ABNORMAL HIGH (ref 70–99)
Glucose-Capillary: 189 mg/dL — ABNORMAL HIGH (ref 70–99)
Glucose-Capillary: 153 mg/dL — ABNORMAL HIGH (ref 70–99)
Glucose-Capillary: 141 mg/dL — ABNORMAL HIGH (ref 70–99)
Glucose-Capillary: 162 mg/dL — ABNORMAL HIGH (ref 70–99)

## 2024-02-18 LAB — RESP PANEL BY RT-PCR (RSV, FLU A&B, COVID)  RVPGX2
Influenza A by PCR: NEGATIVE
Influenza B by PCR: NEGATIVE
Resp Syncytial Virus by PCR: NEGATIVE
SARS Coronavirus 2 by RT PCR: NEGATIVE

## 2024-02-18 LAB — ETHANOL: Alcohol, Ethyl (B): <15 mg/dL (ref <15)

## 2024-02-18 LAB — LIPASE, BLOOD: Lipase: 20 U/L (ref 11–51)

## 2024-02-18 MED ORDER — OXYCODONE-ACETAMINOPHEN 5-325 MG PO TABS
1.0000 | ORAL_TABLET | ORAL | Status: DC | PRN
  Administered 2024-02-18: 1 via ORAL
  Filled 2024-02-18: qty 1

## 2024-02-18 NOTE — ED Notes (Signed)
 Pt okay to DC with BP as she is going to dialysis when leaving here.

## 2024-02-18 NOTE — Discharge Instructions (Addendum)
 Thank you for coming to [facility] Emergency Department. You were seen for hypoglycemia and seizure. We did an exam, labs, and imaging, and these showed no acute findings. Your blood sugar did not drop while in the ED. You can continue to eat and check your blood sugars as you normally would.  It is very important that you attend dialysis.  Please follow-up with your dialysis provider today to receive dialysis today or early tomorrow morning.  Please call them immediately when you leave here to schedule this. Please follow up with your primary care provider within 1 week about your blood sugar and insulin .   Do not hesitate to return to the ED or call 911 if you experience: -Worsening symptoms -Nausea vomiting, abdominal pain -Seizure-like activity -Slurred speech, confusion, asymmetric numbness tingling or weakness -Fevers chills, chest pain, shortness of breath -Lightheadedness, passing out -Fevers/chills -Anything else that concerns you

## 2024-02-18 NOTE — ED Notes (Signed)
I wasn't able to get patient blood. 

## 2024-02-18 NOTE — ED Notes (Signed)
 Bear hugger applied

## 2024-02-18 NOTE — ED Provider Notes (Signed)
 [facility] AT [facility] Provider Note   CSN: [id number] Arrival date & time: 02/18/24  0533     History Chief Complaint  Patient presents with   Hypoglycemia   Seizures    HPI Ashley Freeman is a 33 y.o. female presenting for hypoglycemia and seizure event.  Patient states she took her insulin  as normal last night to her knowledge and ate normally.  Was woken this morning in an ambulance.  Per family she started having shaking that would not improve and EMS was called.  They gave her glucagon  with improvement. From recent discharge:  33 y.o. female with past medical history  of  HTN, HLD, ESRD on HD (MWF), HFrEF, uncontrolled DM type I, bipolar disorder, and noncompliance with multiple admissions for DKA and/or fluid overload presents with multiple complaints of chest pain, abdominal pain and generalized weakness.    Admitted for management  of volume overload, ascites,  hyperglycemia and abdominal pain.  Patient's recorded medical, surgical, social, medication list and allergies were reviewed in the Snapshot window as part of the initial history.   Review of Systems   Review of Systems  Constitutional:  Negative for chills and fever.  HENT:  Negative for ear pain and sore throat.   Eyes:  Negative for pain and visual disturbance.  Respiratory:  Negative for cough and shortness of breath.   Cardiovascular:  Negative for chest pain and palpitations.  Gastrointestinal:  Negative for abdominal pain and vomiting.  Genitourinary:  Negative for dysuria and hematuria.  Musculoskeletal:  Negative for arthralgias and back pain.  Skin:  Negative for color change and rash.  Neurological:  Positive for seizures. Negative for syncope.  All other systems reviewed and are negative.   Physical Exam Updated Vital Signs BP (!) 160/101 (BP Location: Right Arm)   Pulse 80   Resp (!) 25   SpO2 100%  Physical Exam Vitals and nursing note  reviewed.  Constitutional:      General: She is not in acute distress.    Appearance: She is well-developed.  HENT:     Head: Normocephalic and atraumatic.  Eyes:     Conjunctiva/sclera: Conjunctivae normal.  Cardiovascular:     Rate and Rhythm: Normal rate and regular rhythm.     Heart sounds: No murmur heard. Pulmonary:     Effort: Pulmonary effort is normal. No respiratory distress.     Breath sounds: Normal breath sounds.  Abdominal:     General: There is no distension.     Palpations: Abdomen is soft.     Tenderness: There is no abdominal tenderness. There is no right CVA tenderness or left CVA tenderness.  Musculoskeletal:        General: No swelling or tenderness. Normal range of motion.     Cervical back: Neck supple.  Skin:    General: Skin is warm and dry.  Neurological:     General: No focal deficit present.     Mental Status: She is alert and oriented to person, place, and time. Mental status is at baseline.     Cranial Nerves: No cranial nerve deficit.      ED Course/ Medical Decision Making/ A&P    Procedures Procedures   Medications Ordered in ED Medications  oxyCODONE -acetaminophen  (PERCOCET/ROXICET) 5-325 MG per tablet 1 tablet (has no administration in time range)   Medical Decision Making:   Ashley Freeman is a 33 y.o. female who presented to the ED today with altered  mental status detailed above.    Patient placed on continuous vitals and telemetry monitoring while in ED which was reviewed periodically.  Complete initial physical exam performed, notably the patient  was HDS in NAD.    Reviewed and confirmed nursing documentation for past medical history, family history, social history.    Initial Assessment:   With the patient's presentation of altered mental status, most likely diagnosis is delerium 2/2 infectious etiology (UTI/CAP/URI) vs metabolic abnormality (Na/K/Mg/Ca) vs nonspecific etiology. Other diagnoses were considered  including (but not limited to) CVA, ICH, intracranial mass, critical dehydration, heptatic dysfunction, uremia, hypercarbia, intoxication, endrocrine abnormality, toxidrome. These are considered less likely due to history of present illness and physical exam findings.   This is most consistent with an acute life/limb threatening illness complicated by underlying chronic conditions.  Initial Plan:  Screening labs including CBC and Metabolic panel to evaluate for infectious or metabolic etiology of disease.  Will plan for hourly glucose monitoring this morning for maintenance of normal glucose.  If patient stays at baseline and has no further breakthrough episodes of seizures this morning and glucose stays within normal limits, patient can likely be managed in the ambulatory setting as this was likely secondary to her insulin  use.  Initial Study Results:   Laboratory  All laboratory results reviewed without evidence of clinically relevant pathology.    Radiology:  All images reviewed independently. Agree with radiology report at this time.   DG Chest Portable 1 View Result Date: 02/18/2024 EXAM: 1 VIEW(S) XRAY OF THE CHEST 02/18/2024 05:58:28 AM COMPARISON: 02/04/2024 CLINICAL HISTORY: Eval for infection/aspiration. Hypoglycemia; Seizures ,Eval for infection/aspiration ; rover FINDINGS: LINES, TUBES AND DEVICES: Right axillary stent partially visible. LUNGS AND PLEURA: Low lung volumes. No focal pulmonary opacity. No pulmonary edema. No pleural effusion. No pneumothorax. HEART AND MEDIASTINUM: Mild cardiomegaly, stable. BONES AND SOFT TISSUES: No acute osseous abnormality. IMPRESSION: 1. No acute cardiopulmonary findings. 2. Low lung volumes. Electronically signed by: Waddell Calk MD 02/18/2024 06:45 AM EDT RP Workstation: [id number]   NM Pulmonary Perfusion Result Date: 02/05/2024 EXAM: NM Lung Perfusion Scan. CLINICAL HISTORY: Pulmonary embolism (PE) suspected, high prob. Patient has been  experiencing SOB and coughing. TECHNIQUE: Radiolabeled MAA was administered intravenously and planar images of the lungs were obtained in multiple projections. RADIOPHARMACEUTICAL: 3.92 mCi Tc72m MAA. COMPARISON: Radiograph 02/04/2024. FINDINGS: PERFUSION: No wedge-shaped peripheral perfusion defect within left or right lung to suggest acute pulmonary embolism. Normal perfusion pattern. IMPRESSION: 1. No perfusion defects to indicate pulmonary embolism. Electronically signed by: Norleen Boxer MD 02/05/2024 04:45 PM EDT RP Workstation: [id number]   DG CHEST PORT 1 VIEW Result Date: 02/04/2024 EXAM: 1 VIEW(S) XRAY OF THE CHEST 02/04/2024 03:55:00 PM COMPARISON: 02/03/2024 CLINICAL HISTORY: Pulmonary embolism FINDINGS: LINES, TUBES AND DEVICES: Right axillary vascular stent noted. LUNGS AND PLEURA: Low lung volumes. Hazy bibasilar opacities. No pleural effusion. No pneumothorax. HEART AND MEDIASTINUM: Stable cardiomegaly. Unchanged mediastinal contours. BONES AND SOFT TISSUES: No acute osseous abnormality. IMPRESSION: 1. Unchanged cardiomegaly. Hazy bibasilar opacities may represent edema. Electronically signed by: Andrea Gasman MD 02/04/2024 08:15 PM EDT RP Workstation: [id number]   IR Paracentesis Result Date: 02/04/2024 INDICATION: 33 year old female. History of end-stage renal disease on hemodialysis with recurrent ascites. Request is for therapeutic paracentesis EXAM: ULTRASOUND GUIDED THERAPEUTIC PARACENTESIS MEDICATIONS: Lidocaine  1% 10 mL COMPLICATIONS: None immediate. PROCEDURE: Informed written consent was obtained from the patient after a discussion of the risks, benefits and alternatives to treatment. A timeout was performed prior to the initiation of the procedure.  Initial ultrasound scanning demonstrates a moderate amount of ascites within the right lower abdominal quadrant. The right lower abdomen was prepped and draped in the usual sterile fashion. 1% lidocaine  was used for local anesthesia.  Following this, a 6 Fr Safe-T-Centesis catheter was introduced. An ultrasound image was saved for documentation purposes. The paracentesis was performed. The catheter was removed and a dressing was applied. The patient tolerated the procedure well without immediate post procedural complication. FINDINGS: A total of approximately 3.1 L of straw-colored fluid was removed. IMPRESSION: Successful ultrasound-guided right-sided paracentesis yielding 3.1 liters of straw-colored peritoneal fluid. Performed by Delon Beagle NP PLAN: If the patient eventually requires >/=2 paracenteses in a 30 day period, candidacy for formal evaluation by the [facility] will be assessed. Electronically Signed   By: Juliene Balder M.D.   On: 02/04/2024 15:39   CT ABDOMEN PELVIS WO CONTRAST Result Date: 02/03/2024 CLINICAL DATA:  Abdominal pain. EXAM: CT ABDOMEN AND PELVIS WITHOUT CONTRAST TECHNIQUE: Multidetector CT imaging of the abdomen and pelvis was performed following the standard protocol without IV contrast. RADIATION DOSE REDUCTION: This exam was performed according to the departmental dose-optimization program which includes automated exposure control, adjustment of the mA and/or kV according to patient size and/or use of iterative reconstruction technique. COMPARISON:  01/15/2024. FINDINGS: Lower chest: Septal thickening and ground-glass in the lung bases. Small to moderate right pleural effusion. Heart is enlarged. Trace pericardial fluid may be physiologic. Distal esophagus is grossly unremarkable. Hepatobiliary: Liver is enlarged, 21.4 cm. Liver is otherwise grossly unremarkable. Cholecystectomy. No biliary ductal dilatation. Pancreas: Grossly unremarkable. Spleen: Negative. Adrenals/Urinary Tract: Adrenal glands and kidneys are grossly unremarkable. Ureters are decompressed. Bladder is grossly unremarkable. Stomach/Bowel: Stomach, small bowel, appendix and colon are  unremarkable. Vascular/Lymphatic: Vascular structures are unremarkable. Scattered abdominal peritoneal ligament and retroperitoneal lymph nodes are not enlarged by CT size criteria. Reproductive: Uterus is visualized.  No adnexal mass. Other: Large ascites.  Omental congestion/edema. Musculoskeletal: None. IMPRESSION: 1. Large ascites. 2. Congestive heart failure. 3. Hepatomegaly. Electronically Signed   By: Newell Eke M.D.   On: 02/03/2024 13:50   DG Chest 2 View Result Date: 02/03/2024 CLINICAL DATA:  Chest and abdominal pain. EXAM: CHEST - 2 VIEW COMPARISON:  Radiograph 01/26/2024 FINDINGS: Low lung volumes. Cardiomegaly is stable. Unchanged mediastinal contours. Central vascular congestion. Mild atelectasis at both lung bases, left greater than right. No confluent opacity. No pleural effusion or pneumothorax. Vascular stent in the right proximal arm. No acute osseous findings. IMPRESSION: 1. Low lung volumes with mild bibasilar atelectasis. 2. Stable cardiomegaly. Central vascular congestion. Electronically Signed   By: Andrea Gasman M.D.   On: 02/03/2024 12:19   DG Chest Portable 1 View Result Date: 01/26/2024 EXAM: 1 VIEW(S) XRAY OF THE CHEST 01/26/2024 12:45:00 PM COMPARISON: 01/24/2024 CLINICAL HISTORY: weakness. Reason for exam:weakness; Triage notes: Pt presents to ED via EMS from home with complaint of vomiting black stuff, no black stool began last night. Weakness. Dialyzed yesterday, missed an appointment or two due to transportation issues. FINDINGS: LUNGS AND PLEURA: Low lung volumes. No focal pulmonary opacity. No pulmonary edema. No pleural effusion. No pneumothorax. HEART AND MEDIASTINUM: Persistent cardiomegaly. No acute abnormality of the mediastinal silhouette. BONES AND SOFT TISSUES: No acute osseous abnormality. IMPRESSION: 1. No acute cardiopulmonary process identified. Electronically signed by: Waddell Calk MD 01/26/2024 01:06 PM EDT RP Workstation: [id number]   DG Chest 2  View Result Date: 01/24/2024 CLINICAL DATA:  Shortness of breath.  Missed hemodialysis EXAM:  CHEST - 2 VIEW COMPARISON:  01/15/2024 FINDINGS: Shallow inspiration. Interstitial infiltrates with mild bronchiectasis in the right lung base, unchanged. No developing consolidation or edema. No pleural effusion or pneumothorax. Mediastinal contours appear intact. Mild cardiac enlargement. IMPRESSION: Similar appearance to previous studies with mild cardiac enlargement and bronchiectasis with mild interstitial infiltrates in the lung bases. Electronically Signed   By: Elsie Gravely M.D.   On: 01/24/2024 23:51    Reassessment and Plan:   Serial lab work still pending on reassessment at this time.  Care of patient signed out to oncoming team pending serial blood work.  Clinical Impression:  1. Seizures (HCC)   2. Hypoglycemia      Data Unavailable   Final Clinical Impression(s) / ED Diagnoses Final diagnoses:  Seizures (HCC)  Hypoglycemia    Rx / DC Orders ED Discharge Orders     None         Jerral Meth, MD 02/18/24 [phone]

## 2024-02-18 NOTE — ED Notes (Addendum)
 Patient having episodes of diarrhea this morning. Patient went 3x with very loose stool with flaky food by products. Cream/yellow color.   Patient was given hospital underwear and bed sheets were changed as well.   Franklyn, MD aware.

## 2024-02-18 NOTE — ED Provider Notes (Signed)
 7:10 AM Assumed care of patient from off-going team. For more details, please see note from same day.  In brief, this is a 33 y.o. female with ESRD on HD MWF, T1DM uncontrolled w/ frequent DKA, HTN, HLD, bipolar disorder, and noncompliance w/ multiple admission who took her insulin  last night but unsure how much she took. EMS found her seizing, found BG in 30s, gave her D50 and glucagon .   Plan/Dispo at time of sign-out & ED Course since sign-out: [ ]  q1h glucose, IV team for labs  BP (!) 160/101 (BP Location: Right Arm)   Pulse 80   Resp (!) 25   SpO2 100%    ED Course:   Clinical Course as of 02/22/24 1537  Tue Feb 18, 2024  0712 Glucose-Capillary(!): 189 [HN]  0840 Glucose-Capillary(!): 153 [HN]  1038 Temp: 97.6 F (36.4 C) [HN]  1038 WBC(!): 15.2 +leukocytosis w/ left shift [HN]  1038 Hemoglobin(!): 9.8 C/w chronic anemia [HN]  1038 Alcohol, Ethyl (B): <15 [HN]  1149 Glucose-Capillary(!): 141 [HN]  1303 Glucose-Capillary(!): 162 [HN]  1424 Patient reevaluated. She is feeling at her baseline. She has not had any recurrent episodes of her hypoglycemia today.  She is unsure what made her hypoglycemic earlier.  I advised her to eat well and to check her blood sugars regularly.  She states that she can try to get in with her hemodialysis later today or early tomorrow morning.  She has no respiratory distress, no chest pain, no significant acute electrolyte derangements. [HN]    Clinical Course User Index [HN] Franklyn Sid SAILOR, MD   Patient feeling improved, no emergent findings present, and I confirmed with nephrology that as long as she is feeling okay, an elevated BUN to 86 and Cr 10 in s/o ESRD doesn't necessitate emergent dialysis. Patient advised to f/u with o/p dialysis center tonight or early tomorrow morning. Advised to check her BGs and take her insulin  as prescribed. DC w/ discharge instructions/return precautions. All questions answered to patient's satisfaction.     ------------------------------- Sid Franklyn, MD Emergency Medicine  This note was created using dictation software, which may contain spelling or grammatical errors.   Franklyn Sid SAILOR, MD 02/22/24 [phone]

## 2024-02-18 NOTE — ED Triage Notes (Signed)
 Pt to ED via [facility] c/o hypoglycemia. Family reports seizure BS: 37 mg/dl when ems arrived. 1 mg glucagon  IM given en route. 24 G IV left wrist. Dialysis MWF missed last two sessions. BS now 97 mg/dl. Abdomen distended. GCS 15.

## 2024-02-18 NOTE — ED Notes (Signed)
 Encouraged patient to provide urine sample. Patient not able to go at this time.

## 2024-02-18 NOTE — ED Notes (Signed)
 Encouraged patient to provide UA. Pt states she does not have to urinate now. She also states she is dialysis pt and doesn't produce much urine.

## 2024-02-19 NOTE — Care Plan (Signed)
   Transitions of Care from ED   Call attempt: 1 (Comment: [place]) Admission date: 02/18/24  Discharge date: 02/18/24  Discharge diagnosis: Hypoglycemia  Do you have a hospital follow up appointment?: Yes - Within 14 Days, Yes with Specialty Provider clinic F/U Date: 02/26/24 F/U Provider: Storm Honer, MD Patient post discharge: Medications: SABRA   [facility]: [phone]:  .  [facility]: [phone]:  .  Other: Contact PCP:       [facility] TRANSITIONAL CASE MANAGEMENT SUMMARY NOTE   Attempted to contact patient today at Cell to complete Transitional Case Management call from [facility]. Patient not available and Letter sent to last known address; 1st attempt.          Harlene JINNY Beets, RN

## 2024-02-20 ENCOUNTER — Inpatient Hospital Stay (HOSPITAL_COMMUNITY)
Admission: EM | Admit: 2024-02-20 | Discharge: 2024-02-28 | DRG: 625 | Disposition: A | Discharge status: Home Health | Attending: Internal Medicine | Admitting: Internal Medicine

## 2024-02-20 ENCOUNTER — Emergency Department (HOSPITAL_COMMUNITY)

## 2024-02-20 ENCOUNTER — Other Ambulatory Visit: Payer: Self-pay

## 2024-02-20 ENCOUNTER — Encounter (HOSPITAL_COMMUNITY): Payer: Self-pay

## 2024-02-20 DIAGNOSIS — Z794 Long term (current) use of insulin: Secondary | ICD-10-CM

## 2024-02-20 DIAGNOSIS — I132 Hypertensive heart and chronic kidney disease with heart failure and with stage 5 chronic kidney disease, or end stage renal disease: Secondary | ICD-10-CM | POA: Diagnosis present

## 2024-02-20 DIAGNOSIS — E1010 Type 1 diabetes mellitus with ketoacidosis without coma: Secondary | ICD-10-CM | POA: Diagnosis not present

## 2024-02-20 DIAGNOSIS — E10649 Type 1 diabetes mellitus with hypoglycemia without coma: Secondary | ICD-10-CM | POA: Diagnosis not present

## 2024-02-20 DIAGNOSIS — I428 Other cardiomyopathies: Secondary | ICD-10-CM | POA: Diagnosis present

## 2024-02-20 DIAGNOSIS — E119 Type 2 diabetes mellitus without complications: Secondary | ICD-10-CM | POA: Diagnosis not present

## 2024-02-20 DIAGNOSIS — Z885 Allergy status to narcotic agent status: Secondary | ICD-10-CM | POA: Diagnosis not present

## 2024-02-20 DIAGNOSIS — K761 Chronic passive congestion of liver: Secondary | ICD-10-CM | POA: Diagnosis present

## 2024-02-20 DIAGNOSIS — I5020 Unspecified systolic (congestive) heart failure: Secondary | ICD-10-CM | POA: Diagnosis not present

## 2024-02-20 DIAGNOSIS — L0211 Cutaneous abscess of neck: Secondary | ICD-10-CM | POA: Diagnosis not present

## 2024-02-20 DIAGNOSIS — B9561 Methicillin susceptible Staphylococcus aureus infection as the cause of diseases classified elsewhere: Secondary | ICD-10-CM | POA: Diagnosis present

## 2024-02-20 DIAGNOSIS — G43909 Migraine, unspecified, not intractable, without status migrainosus: Secondary | ICD-10-CM | POA: Diagnosis present

## 2024-02-20 DIAGNOSIS — N186 End stage renal disease: Secondary | ICD-10-CM | POA: Diagnosis present

## 2024-02-20 DIAGNOSIS — K920 Hematemesis: Secondary | ICD-10-CM | POA: Diagnosis not present

## 2024-02-20 DIAGNOSIS — I5023 Acute on chronic systolic (congestive) heart failure: Secondary | ICD-10-CM | POA: Diagnosis present

## 2024-02-20 DIAGNOSIS — Z881 Allergy status to other antibiotic agents status: Secondary | ICD-10-CM | POA: Diagnosis not present

## 2024-02-20 DIAGNOSIS — Z825 Family history of asthma and other chronic lower respiratory diseases: Secondary | ICD-10-CM

## 2024-02-20 DIAGNOSIS — R4182 Altered mental status, unspecified: Secondary | ICD-10-CM

## 2024-02-20 DIAGNOSIS — Z79899 Other long term (current) drug therapy: Secondary | ICD-10-CM

## 2024-02-20 DIAGNOSIS — Z992 Dependence on renal dialysis: Secondary | ICD-10-CM | POA: Diagnosis not present

## 2024-02-20 DIAGNOSIS — E785 Hyperlipidemia, unspecified: Secondary | ICD-10-CM | POA: Diagnosis present

## 2024-02-20 DIAGNOSIS — Z888 Allergy status to other drugs, medicaments and biological substances status: Secondary | ICD-10-CM

## 2024-02-20 DIAGNOSIS — F319 Bipolar disorder, unspecified: Secondary | ICD-10-CM | POA: Diagnosis present

## 2024-02-20 DIAGNOSIS — J029 Acute pharyngitis, unspecified: Secondary | ICD-10-CM

## 2024-02-20 DIAGNOSIS — Z880 Allergy status to penicillin: Secondary | ICD-10-CM

## 2024-02-20 DIAGNOSIS — E1011 Type 1 diabetes mellitus with ketoacidosis with coma: Principal | ICD-10-CM

## 2024-02-20 DIAGNOSIS — D631 Anemia in chronic kidney disease: Secondary | ICD-10-CM | POA: Diagnosis present

## 2024-02-20 DIAGNOSIS — E1110 Type 2 diabetes mellitus with ketoacidosis without coma: Secondary | ICD-10-CM | POA: Diagnosis present

## 2024-02-20 DIAGNOSIS — I110 Hypertensive heart disease with heart failure: Secondary | ICD-10-CM | POA: Diagnosis not present

## 2024-02-20 DIAGNOSIS — G9341 Metabolic encephalopathy: Secondary | ICD-10-CM | POA: Diagnosis present

## 2024-02-20 DIAGNOSIS — N2581 Secondary hyperparathyroidism of renal origin: Secondary | ICD-10-CM | POA: Diagnosis present

## 2024-02-20 DIAGNOSIS — E1022 Type 1 diabetes mellitus with diabetic chronic kidney disease: Secondary | ICD-10-CM | POA: Diagnosis present

## 2024-02-20 DIAGNOSIS — M25511 Pain in right shoulder: Secondary | ICD-10-CM | POA: Diagnosis present

## 2024-02-20 DIAGNOSIS — R1310 Dysphagia, unspecified: Secondary | ICD-10-CM | POA: Diagnosis not present

## 2024-02-20 DIAGNOSIS — Z833 Family history of diabetes mellitus: Secondary | ICD-10-CM

## 2024-02-20 DIAGNOSIS — Z91158 Patient's noncompliance with renal dialysis for other reason: Secondary | ICD-10-CM

## 2024-02-20 DIAGNOSIS — E109 Type 1 diabetes mellitus without complications: Secondary | ICD-10-CM | POA: Diagnosis present

## 2024-02-20 DIAGNOSIS — E875 Hyperkalemia: Secondary | ICD-10-CM | POA: Diagnosis present

## 2024-02-20 DIAGNOSIS — I5042 Chronic combined systolic (congestive) and diastolic (congestive) heart failure: Secondary | ICD-10-CM | POA: Diagnosis not present

## 2024-02-20 DIAGNOSIS — E8779 Other fluid overload: Secondary | ICD-10-CM

## 2024-02-20 DIAGNOSIS — Z9049 Acquired absence of other specified parts of digestive tract: Secondary | ICD-10-CM

## 2024-02-20 LAB — BETA-HYDROXYBUTYRIC ACID
Beta-Hydroxybutyric Acid: >8 mmol/L — ABNORMAL HIGH (ref 0.05–0.27)
Beta-Hydroxybutyric Acid: >8 mmol/L — ABNORMAL HIGH (ref 0.05–0.27)
Beta-Hydroxybutyric Acid: 7.1 mmol/L — ABNORMAL HIGH (ref 0.05–0.27)
Beta-Hydroxybutyric Acid: 1.25 mmol/L — ABNORMAL HIGH (ref 0.05–0.27)
Beta-Hydroxybutyric Acid: 0.38 mmol/L — ABNORMAL HIGH (ref 0.05–0.27)

## 2024-02-20 LAB — I-STAT CG4 LACTIC ACID, ED
Lactic Acid, Venous: 2.2 mmol/L (ref 0.5–1.9)
Lactic Acid, Venous: 2.3 mmol/L (ref 0.5–1.9)

## 2024-02-20 LAB — COMPREHENSIVE METABOLIC PANEL WITH GFR
ALT: 59 U/L — ABNORMAL HIGH (ref 0–44)
AST: 47 U/L — ABNORMAL HIGH (ref 15–41)
Albumin: 3.1 g/dL — ABNORMAL LOW (ref 3.5–5.0)
Alkaline Phosphatase: 550 U/L — ABNORMAL HIGH (ref 38–126)
BUN: 107 mg/dL — ABNORMAL HIGH (ref 6–20)
CO2: <7 mmol/L — ABNORMAL LOW (ref 22–32)
Calcium: 8.6 mg/dL — ABNORMAL LOW (ref 8.9–10.3)
Chloride: 91 mmol/L — ABNORMAL LOW (ref 98–111)
Creatinine, Ser: 11.87 mg/dL — ABNORMAL HIGH (ref 0.44–1.00)
GFR, Estimated: 4 mL/min — ABNORMAL LOW (ref >60)
Glucose, Bld: 873 mg/dL (ref 70–99)
Potassium: 7.3 mmol/L (ref 3.5–5.1)
Sodium: 128 mmol/L — ABNORMAL LOW (ref 135–145)
Total Bilirubin: 2.4 mg/dL — ABNORMAL HIGH (ref 0.0–1.2)
Total Protein: 5.6 g/dL — ABNORMAL LOW (ref 6.5–8.1)

## 2024-02-20 LAB — CBC WITH DIFFERENTIAL/PLATELET
Abs Immature Granulocytes: 0.11 K/uL — ABNORMAL HIGH (ref 0.00–0.07)
Basophils Absolute: 0.1 K/uL (ref 0.0–0.1)
Basophils Relative: 0 %
Eosinophils Absolute: 0 K/uL (ref 0.0–0.5)
Eosinophils Relative: 0 %
HCT: 30.6 % — ABNORMAL LOW (ref 36.0–46.0)
Hemoglobin: 8.8 g/dL — ABNORMAL LOW (ref 12.0–15.0)
Immature Granulocytes: 1 %
Lymphocytes Relative: 15 %
Lymphs Abs: 2.3 K/uL (ref 0.7–4.0)
MCH: 27.5 pg (ref 26.0–34.0)
MCHC: 28.8 g/dL — ABNORMAL LOW (ref 30.0–36.0)
MCV: 95.6 fL (ref 80.0–100.0)
Monocytes Absolute: 0.9 K/uL (ref 0.1–1.0)
Monocytes Relative: 6 %
Neutro Abs: 12.3 K/uL — ABNORMAL HIGH (ref 1.7–7.7)
Neutrophils Relative %: 78 %
Platelets: 333 K/uL (ref 150–400)
RBC: 3.2 MIL/uL — ABNORMAL LOW (ref 3.87–5.11)
RDW: 15.9 % — ABNORMAL HIGH (ref 11.5–15.5)
WBC: 15.7 K/uL — ABNORMAL HIGH (ref 4.0–10.5)
nRBC: 0 % (ref 0.0–0.2)

## 2024-02-20 LAB — CBG MONITORING, ED
Glucose-Capillary: >600 mg/dL (ref 70–99)
Glucose-Capillary: >600 mg/dL (ref 70–99)
Glucose-Capillary: >600 mg/dL (ref 70–99)
Glucose-Capillary: >600 mg/dL (ref 70–99)
Glucose-Capillary: >600 mg/dL (ref 70–99)
Glucose-Capillary: >600 mg/dL (ref 70–99)
Glucose-Capillary: >600 mg/dL (ref 70–99)
Glucose-Capillary: >600 mg/dL (ref 70–99)
Glucose-Capillary: >600 mg/dL (ref 70–99)
Glucose-Capillary: >600 mg/dL (ref 70–99)
Glucose-Capillary: 406 mg/dL — ABNORMAL HIGH (ref 70–99)
Glucose-Capillary: 511 mg/dL (ref 70–99)
Glucose-Capillary: 378 mg/dL — ABNORMAL HIGH (ref 70–99)
Glucose-Capillary: 261 mg/dL — ABNORMAL HIGH (ref 70–99)
Glucose-Capillary: 188 mg/dL — ABNORMAL HIGH (ref 70–99)
Glucose-Capillary: 172 mg/dL — ABNORMAL HIGH (ref 70–99)
Glucose-Capillary: 169 mg/dL — ABNORMAL HIGH (ref 70–99)
Glucose-Capillary: 131 mg/dL — ABNORMAL HIGH (ref 70–99)

## 2024-02-20 LAB — BLOOD GAS, VENOUS
Acid-base deficit: 26.5 mmol/L — ABNORMAL HIGH (ref 0.0–2.0)
Acid-base deficit: 18.5 mmol/L — ABNORMAL HIGH (ref 0.0–2.0)
Bicarbonate: 4 mmol/L — ABNORMAL LOW (ref 20.0–28.0)
Bicarbonate: 8.7 mmol/L — ABNORMAL LOW (ref 20.0–28.0)
O2 Saturation: 96.1 %
O2 Saturation: 95.5 %
Patient temperature: 37
Patient temperature: 37
pCO2, Ven: 18 mmHg — CL (ref 44–60)
pCO2, Ven: 25 mmHg — ABNORMAL LOW (ref 44–60)
pH, Ven: 6.96 — CL (ref 7.25–7.43)
pH, Ven: 7.15 — CL (ref 7.25–7.43)
pO2, Ven: 149 mmHg — ABNORMAL HIGH (ref 32–45)
pO2, Ven: 68 mmHg — ABNORMAL HIGH (ref 32–45)

## 2024-02-20 LAB — BASIC METABOLIC PANEL WITH GFR
Anion gap: 32 — ABNORMAL HIGH (ref 5–15)
Anion gap: 18 — ABNORMAL HIGH (ref 5–15)
Anion gap: 17 — ABNORMAL HIGH (ref 5–15)
BUN: 111 mg/dL — ABNORMAL HIGH (ref 6–20)
BUN: 45 mg/dL — ABNORMAL HIGH (ref 6–20)
BUN: 44 mg/dL — ABNORMAL HIGH (ref 6–20)
CO2: 9 mmol/L — ABNORMAL LOW (ref 22–32)
CO2: 21 mmol/L — ABNORMAL LOW (ref 22–32)
CO2: 22 mmol/L (ref 22–32)
Calcium: 8.5 mg/dL — ABNORMAL LOW (ref 8.9–10.3)
Calcium: 8.6 mg/dL — ABNORMAL LOW (ref 8.9–10.3)
Calcium: 8.3 mg/dL — ABNORMAL LOW (ref 8.9–10.3)
Chloride: 93 mmol/L — ABNORMAL LOW (ref 98–111)
Chloride: 95 mmol/L — ABNORMAL LOW (ref 98–111)
Chloride: 96 mmol/L — ABNORMAL LOW (ref 98–111)
Creatinine, Ser: 12.51 mg/dL — ABNORMAL HIGH (ref 0.44–1.00)
Creatinine, Ser: 5.74 mg/dL — ABNORMAL HIGH (ref 0.44–1.00)
Creatinine, Ser: 5.74 mg/dL — ABNORMAL HIGH (ref 0.44–1.00)
GFR, Estimated: 4 mL/min — ABNORMAL LOW (ref >60)
GFR, Estimated: 9 mL/min — ABNORMAL LOW (ref >60)
GFR, Estimated: 9 mL/min — ABNORMAL LOW (ref >60)
Glucose, Bld: 687 mg/dL (ref 70–99)
Glucose, Bld: 215 mg/dL — ABNORMAL HIGH (ref 70–99)
Glucose, Bld: 161 mg/dL — ABNORMAL HIGH (ref 70–99)
Potassium: 5.5 mmol/L — ABNORMAL HIGH (ref 3.5–5.1)
Potassium: 3.9 mmol/L (ref 3.5–5.1)
Potassium: 4.1 mmol/L (ref 3.5–5.1)
Sodium: 134 mmol/L — ABNORMAL LOW (ref 135–145)
Sodium: 134 mmol/L — ABNORMAL LOW (ref 135–145)
Sodium: 135 mmol/L (ref 135–145)

## 2024-02-20 LAB — ETHANOL: Alcohol, Ethyl (B): <15 mg/dL (ref <15)

## 2024-02-20 LAB — LIPASE, BLOOD: Lipase: 18 U/L (ref 11–51)

## 2024-02-20 LAB — HCG, SERUM, QUALITATIVE: Preg, Serum: NEGATIVE

## 2024-02-20 MED ORDER — OLANZAPINE 5 MG PO TBDP
5.0000 mg | ORAL_TABLET | Freq: Every day | ORAL | Status: DC
  Administered 2024-02-21 – 2024-02-27 (×7): 5 mg via ORAL
  Filled 2024-02-20 (×9): qty 1

## 2024-02-20 MED ORDER — LAMOTRIGINE 100 MG PO TABS
200.0000 mg | ORAL_TABLET | Freq: Every day | ORAL | Status: DC
  Administered 2024-02-21 – 2024-02-28 (×7): 200 mg via ORAL
  Filled 2024-02-20 (×7): qty 2

## 2024-02-20 MED ORDER — ROSUVASTATIN CALCIUM 20 MG PO TABS
40.0000 mg | ORAL_TABLET | Freq: Every day | ORAL | Status: DC
  Administered 2024-02-21 – 2024-02-28 (×7): 40 mg via ORAL
  Filled 2024-02-20 (×7): qty 2

## 2024-02-20 MED ORDER — OXYCODONE-ACETAMINOPHEN 5-325 MG PO TABS
1.0000 | ORAL_TABLET | Freq: Once | ORAL | Status: AC
  Administered 2024-02-21: 1 via ORAL
  Filled 2024-02-20: qty 1

## 2024-02-20 MED ORDER — SEVELAMER CARBONATE 800 MG PO TABS
800.0000 mg | ORAL_TABLET | Freq: Three times a day (TID) | ORAL | Status: DC
  Administered 2024-02-22 – 2024-02-28 (×11): 800 mg via ORAL
  Filled 2024-02-20 (×14): qty 1

## 2024-02-20 MED ORDER — SODIUM ZIRCONIUM CYCLOSILICATE 10 G PO PACK
10.0000 g | PACK | Freq: Once | ORAL | Status: AC
  Administered 2024-02-20: 10 g via ORAL
  Filled 2024-02-20: qty 1

## 2024-02-20 MED ORDER — DEXTROSE IN LACTATED RINGERS 5 % IV SOLN: INTRAVENOUS | Status: AC

## 2024-02-20 MED ORDER — METOCLOPRAMIDE HCL 5 MG/ML IJ SOLN
10.0000 mg | Freq: Once | INTRAMUSCULAR | Status: AC
  Administered 2024-02-20: 10 mg via INTRAVENOUS
  Filled 2024-02-20: qty 2

## 2024-02-20 MED ORDER — ALBUTEROL SULFATE (2.5 MG/3ML) 0.083% IN NEBU
10.0000 mg | INHALATION_SOLUTION | Freq: Once | RESPIRATORY_TRACT | Status: AC
  Administered 2024-02-20: 10 mg via RESPIRATORY_TRACT
  Filled 2024-02-20: qty 12

## 2024-02-20 MED ORDER — DEXTROSE-SODIUM CHLORIDE 10-0.45 % IV SOLN
INTRAVENOUS | Status: DC
  Filled 2024-02-20 (×5): qty 1000

## 2024-02-20 MED ORDER — LIDOCAINE HCL (PF) 1 % IJ SOLN: 5.0000 mL | INTRAMUSCULAR | Status: DC | PRN

## 2024-02-20 MED ORDER — HEPARIN SODIUM (PORCINE) 5000 UNIT/ML IJ SOLN
5000.0000 [IU] | Freq: Three times a day (TID) | INTRAMUSCULAR | Status: DC
  Administered 2024-02-20 – 2024-02-28 (×22): 5000 [IU] via SUBCUTANEOUS
  Filled 2024-02-20 (×21): qty 1

## 2024-02-20 MED ORDER — SODIUM ZIRCONIUM CYCLOSILICATE 10 G PO PACK
10.0000 g | PACK | Freq: Once | ORAL | Status: DC
  Filled 2024-02-20: qty 1

## 2024-02-20 MED ORDER — CALCIUM GLUCONATE-NACL 1-0.675 GM/50ML-% IV SOLN
1.0000 g | Freq: Once | INTRAVENOUS | Status: DC
  Filled 2024-02-20: qty 50

## 2024-02-20 MED ORDER — INSULIN REGULAR(HUMAN) IN NACL 100-0.9 UT/100ML-% IV SOLN
INTRAVENOUS | Status: DC
  Administered 2024-02-20: 7 [IU]/h via INTRAVENOUS
  Administered 2024-02-21: 0.4 [IU]/h via INTRAVENOUS
  Administered 2024-02-21: 1.2 [IU]/h via INTRAVENOUS
  Filled 2024-02-20 (×3): qty 100

## 2024-02-20 MED ORDER — LIDOCAINE-PRILOCAINE 2.5-2.5 % EX CREA: 1.0000 | TOPICAL_CREAM | CUTANEOUS | Status: DC | PRN

## 2024-02-20 MED ORDER — DEXTROSE 50 % IV SOLN
0.0000 mL | INTRAVENOUS | Status: DC | PRN
  Administered 2024-02-23 – 2024-02-27 (×3): 25 mL via INTRAVENOUS
  Administered 2024-02-27: 50 mL via INTRAVENOUS
  Filled 2024-02-20 (×5): qty 50

## 2024-02-20 MED ORDER — LACTATED RINGERS IV SOLN: INTRAVENOUS | Status: AC

## 2024-02-20 MED ORDER — CALCITRIOL 0.25 MCG PO CAPS
1.2500 ug | ORAL_CAPSULE | ORAL | Status: DC
  Administered 2024-02-24 – 2024-02-28 (×3): 1.25 ug via ORAL
  Filled 2024-02-20 (×4): qty 5

## 2024-02-20 MED ORDER — IPRATROPIUM-ALBUTEROL 0.5-2.5 (3) MG/3ML IN SOLN: 3.0000 mL | Freq: Four times a day (QID) | RESPIRATORY_TRACT | Status: DC | PRN

## 2024-02-20 MED ORDER — SACUBITRIL-VALSARTAN 24-26 MG PO TABS: 1.0000 | ORAL_TABLET | Freq: Two times a day (BID) | ORAL | Status: DC

## 2024-02-20 MED ORDER — HYDROXYZINE HCL 25 MG PO TABS
25.0000 mg | ORAL_TABLET | Freq: Three times a day (TID) | ORAL | Status: DC | PRN
  Administered 2024-02-21 – 2024-02-28 (×5): 25 mg via ORAL
  Filled 2024-02-20 (×5): qty 1

## 2024-02-20 MED ORDER — HEPARIN SODIUM (PORCINE) 1000 UNIT/ML DIALYSIS: 40.0000 [IU]/kg | INTRAMUSCULAR | Status: DC | PRN

## 2024-02-20 MED ORDER — CARVEDILOL 25 MG PO TABS
25.0000 mg | ORAL_TABLET | Freq: Two times a day (BID) | ORAL | Status: DC
  Administered 2024-02-22 – 2024-02-28 (×11): 25 mg via ORAL
  Filled 2024-02-20 (×2): qty 2
  Filled 2024-02-20: qty 1
  Filled 2024-02-20: qty 2
  Filled 2024-02-20 (×7): qty 1

## 2024-02-20 MED ORDER — FLUTICASONE PROPIONATE 50 MCG/ACT NA SUSP
2.0000 | Freq: Every day | NASAL | Status: DC
  Administered 2024-02-22 – 2024-02-28 (×5): 2 via NASAL
  Filled 2024-02-20: qty 16

## 2024-02-20 MED ORDER — LACTATED RINGERS IV BOLUS
1000.0000 mL | Freq: Once | INTRAVENOUS | Status: AC
  Administered 2024-02-20: 1000 mL via INTRAVENOUS

## 2024-02-20 MED ORDER — LACTATED RINGERS IV BOLUS: 20.0000 mL/kg | Freq: Once | INTRAVENOUS | Status: DC

## 2024-02-20 MED ORDER — DULOXETINE HCL 20 MG PO CPEP
20.0000 mg | ORAL_CAPSULE | Freq: Every day | ORAL | Status: DC
  Administered 2024-02-21 – 2024-02-28 (×7): 20 mg via ORAL
  Filled 2024-02-20 (×7): qty 1

## 2024-02-20 MED ORDER — BUMETANIDE 2 MG PO TABS
10.0000 mg | ORAL_TABLET | Freq: Every day | ORAL | Status: DC
  Administered 2024-02-21 – 2024-02-28 (×7): 10 mg via ORAL
  Filled 2024-02-20 (×9): qty 5

## 2024-02-20 MED ORDER — PENTAFLUOROPROP-TETRAFLUOROETH EX AERO
1.0000 | INHALATION_SPRAY | CUTANEOUS | Status: DC | PRN
Start: 2024-02-20 — End: 2024-02-21

## 2024-02-20 NOTE — ED Provider Notes (Signed)
 [facility] Provider Note  MDM   HPI/ROS:  Ashley Freeman is a 33 y.o. female with a medical history HTN, HLD, ESRD on HD (MWF), HFrEF, uncontrolled DM type I, bipolar disorder, and noncompliance with multiple admissions for DKA and/or fluid overload presents with multiple complaints of chest pain, abdominal pain and generalized weakness. who presents hyperglycemia.  Patient states that she has not been feeling well over the last 2 days therefore has not taken her insulin , complaining of pain all over.  She states she only made it through half of dialysis yesterday and has had persistent vomiting.  Denies any fevers, chills.   On my initial evaluation, patient is:  -Vital signs stable. Patient afebrile, normotensive but softer BPs, and toxic appearing.  ABCs intact.  Physical exam is notable for: - Volume overload, periorbital edema, abdominal distention.  Abdomen is still soft, nonrigid, not peritonitic and diffusely nontender.  --Lungs with bilateral lower lobe crackles -- Right upper arm fistula with palpable thrill  -Additional history obtained from patient's chart   Differentials include DKA, HHS/hyperglycemia, volume overload, metabolic derangements, infection, sepsis.    Initial CBG greater than 600.  Patient is a lactic acid of 2.2, negative pregnancy, beta hydroxybutyrate greater than 8, leukocytosis 15, hemoglobin 8.8 (both of these similar to baseline and repeated from 2 days prior), CMP hyperkalemia K7.3, EKG without peaked T waves, no ST elevations concerning for ischemia, intervals all WNL.  VBG despite multiple sticks with difficulty with running numbers from the lab, pH is consistently for both been as 7.01 with a bicarb <15.  CMP glucose 873).  AST/ALT/alk phos all elevated near patient's baseline. 1 L LR bolus given, Endo tool ordered.  Lokelma , albuterol  also given for hyperkalemia.  No indication for calcium  gluconate  at this time given no EKG changes.  Nephrology is consulted for patient volume overloaded with hyper-K (confounded given that patient is in DKA), but will need dialysis.  Nephrology agreed to follow patient while admitted and arrange for dialysis.  Patient thought to require admission to stepdown unit for DKA.  Interpretations, interventions, and the patient's course of care are documented below.    Clinical Course as of 02/20/24 1010  Thu Feb 20, 2024  0932 EKG sinus rhythm without peaked T waves, no ST elevations concerning for ischemia, intervals all WNL [SA]  0955 Nephro contacted, will follow [SA]  1009 VBG despite multiple sticks difficulty with running numbers, pH has been consistent for both at 7.01, bicarb <15 [SA]    Clinical Course User Index [SA] Billy Pal, MD     Disposition:  I discussed the case with hospitalist who graciously agreed to admit the patient to their service for continued care.   Clinical Impression:  1. Type 1 diabetes mellitus with ketoacidotic coma (HCC)   2. Altered mental status, unspecified altered mental status type   3. Hyperkalemia   4. Other hypervolemia     Clinical Complexity A medically appropriate history, review of systems, and physical exam was performed.  My independent interpretations of EKG, labs, and radiology are documented in the ED course above.   If decision rules were used in this patient's evaluation, they are listed below.   Click here for ABCD2, HEART and other calculatorsREFRESH Note before signing   Patient's presentation is most consistent with acute presentation with potential threat to life or bodily function.  Medical Decision Making Amount and/or Complexity of Data Reviewed Labs: ordered. Radiology: ordered.  Risk  Prescription drug management. Decision regarding hospitalization.    HPI/ROS      See MDM section for pertinent HPI and ROS. A complete ROS was performed with pertinent  positives/negatives noted above.   Past Medical History:  Diagnosis Date   Abscess, gluteal, right 08/24/2013   Anemia 02/19/2012   Bartholin's gland abscess 09/19/2013   Bipolar disorder (HCC)    BV (bacterial vaginosis) 11/24/2015   Depression    Diabetes mellitus type I (HCC) 2001   Diagnosed at age 35 ; Type I   Diarrhea 05/30/2016   DKA (diabetic ketoacidoses) 08/19/2013   Also in 2018   ESRD (end stage renal disease) (HCC)    Gonorrhea 08/2011   Treated in 09/2011   HFrEF (heart failure with reduced ejection fraction) (HCC)    a. 2022 Echo: EF 40%; b. 10/2021 Echo: EF 55%; b. 07/2022 MV: No ischemia. EF 31%; c. 08/2022 Echo: EF 35%, mildly dil RV, sev TR.   History of trichomoniasis 05/31/2016   Hyperlipidemia 03/28/2016   Hypertension    NICM (nonischemic cardiomyopathy) (HCC)    Sepsis (HCC) 09/19/2013    Past Surgical History:  Procedure Laterality Date   A/V FISTULAGRAM Right 06/17/2023   Procedure: A/V Fistulagram;  Surgeon: Marea Selinda RAMAN, MD;  Location: [facility] INVASIVE CV LAB;  Service: Cardiovascular;  Laterality: Right;   A/V SHUNT INTERVENTION N/A 09/25/2023   Procedure: A/V SHUNT INTERVENTION;  Surgeon: Pearline Norman RAMAN, MD;  Location: [facility] LAB;  Service: Cardiovascular;  Laterality: N/A;   AV FISTULA PLACEMENT Right 07/06/2022   Procedure: ARTERIOVENOUS GRAFT CREATION;  Surgeon: Gretta Lonni PARAS, MD;  Location: [facility] OR;  Service: Vascular;  Laterality: Right;   CESAREAN SECTION N/A 10/05/2019   Procedure: CESAREAN SECTION;  Surgeon: Izell Harari, MD;  Location: [facility] LD ORS;  Service: Obstetrics;  Laterality: N/A;   CHOLECYSTECTOMY N/A 07/02/2023   Procedure: LAPAROSCOPIC CHOLECYSTECTOMY;  Surgeon: Ebbie Cough, MD;  Location: [facility] OR;  Service: General;  Laterality: N/A;   ESOPHAGOGASTRODUODENOSCOPY N/A 01/31/2024   Procedure: EGD (ESOPHAGOGASTRODUODENOSCOPY);  Surgeon: San Sandor GAILS, DO;  Location: [facility] ENDOSCOPY;  Service: Gastroenterology;  Laterality:  N/A;   INCISION AND DRAINAGE ABSCESS Left 09/28/2019   Procedure: INCISION AND DRAINAGE VULVAR ABCESS;  Surgeon: Edsel Norleen GAILS, MD;  Location: [facility] OR;  Service: Gynecology;  Laterality: Left;   INCISION AND DRAINAGE PERIRECTAL ABSCESS Right 08/18/2013   Procedure: IRRIGATION AND DEBRIDEMENT GLUTEAL ABSCESS;  Surgeon: Lynda Leos, MD;  Location: [facility] OR;  Service: General;  Laterality: Right;   INCISION AND DRAINAGE PERIRECTAL ABSCESS Right 09/19/2013   Procedure: IRRIGATION AND DEBRIDEMENT RIGHT GLUTEAL AND LABIAL ABSCESSES;  Surgeon: Lynda Leos, MD;  Location: [facility] OR;  Service: General;  Laterality: Right;   INCISION AND DRAINAGE PERIRECTAL ABSCESS Right 09/24/2013   Procedure: IRRIGATION AND DEBRIDEMENT PERIRECTAL ABSCESS;  Surgeon: Lynwood MALVA Pina, MD;  Location[facility] OR;  Service: General;  Laterality: Right;   IR PARACENTESIS  08/28/2023   IR PARACENTESIS  11/04/2023   IR PARACENTESIS  12/23/2023   IR PARACENTESIS  02/04/2024      Physical Exam   Vitals:   02/20/24 0633  BP: 125/71  Pulse: 93  Resp: 16  Temp: 98.4 F (36.9 C)  SpO2: 100%  Weight: 77.1 kg  Height: 5' 3 (1.6 m)    Physical Exam Vitals and nursing note reviewed.  Constitutional:      General: She is in acute distress.     Appearance: She is well-developed. She is ill-appearing and  toxic-appearing.  HENT:     Head: Normocephalic and atraumatic.     Mouth/Throat:     Mouth: Mucous membranes are dry.  Eyes:     Conjunctiva/sclera: Conjunctivae normal.  Cardiovascular:     Rate and Rhythm: Normal rate and regular rhythm.     Heart sounds: No murmur heard. Pulmonary:     Effort: Pulmonary effort is normal. No respiratory distress.     Breath sounds: Normal breath sounds.  Abdominal:     Palpations: Abdomen is soft.     Tenderness: There is no abdominal tenderness.  Musculoskeletal:        General: Swelling (Bilateral periorbital, anasarca with abdominal swelling) present.     Cervical back: Neck  supple.  Skin:    General: Skin is warm and dry.     Capillary Refill: Capillary refill takes less than 2 seconds.  Neurological:     Mental Status: She is disoriented.      Procedures   If procedures were preformed on this patient, they are listed below:  Procedures  S. Londen Lorge, DO Electronically signed 02/20/2024  Please note that this documentation was produced with the assistance of voice-to-text technology and may contain errors.     Billy Pal, MD 02/20/24 1530    Doretha Folks, MD 02/21/24 [phone]

## 2024-02-20 NOTE — ED Triage Notes (Signed)
 PT brought in by [facility], PT states she did half of dialysis yesterday, PT states she hasn't felt good and her glucose was reading high per EMS. PT is alert and stated she hasn't taken her insulin  in 2 days due to not feeling well. PT states she was vomiting a lot.

## 2024-02-20 NOTE — ED Notes (Signed)
 Pt continuing to yell help me. Pt will not state what she needs help with

## 2024-02-20 NOTE — ED Notes (Signed)
 Pt yelling Help me over and over again. This RN asked patient how can I help. Patient does not respond to this. Asked patient if she needs to use the bathroom and also asked if she had pain. Pt continues to yell help. Will continue to monitor and periodically ask patient.

## 2024-02-20 NOTE — ED Provider Notes (Incomplete)
 Patient is a 33 year old female with numerous medical problems including end-stage renal disease on dialysis, poor diabetic compliance, heart failure with reduced ejection fraction who is presenting today with complaints of pain all over and just feeling unwell.  Patient last reports using her insulin  2 days ago.  She has had intermittent emesis and reports her body just hurts all over.  Patient tells me she had dialysis yesterday however other people she reports she missed her session.  Patient does appear fluid overloaded with periorbital edema and abdominal distention.  Vital signs are reassuring but concern for fluid overload, hyperkalemia, DKAversus hyperglycemia vs other underlying condition.  Patient reports she has had a fever but there has been no fever here.  Blood sugars greater than 600.  Patient's lab  with lactic acid of 2.2, negative pregnancy, beta hydroxybutyrate of greater than 8, leukocytosis of 15 with unchanged hemoglobin of 8.8, CMP with hyperkalemia with a potassium of 7.3 without EKG changes at this time, blood sugar of 873 with an anion gap of close to 30 with a CO2 of less than 7 and reflexive pseudohyponatremia of 128.  Chest x-ray with small pleural effusion.  Patient was started on Endo tool, fluids were given but judiciously given she is also fluid overloaded and needs dialysis.  She does not make any urine Will consult nephrology for dialysis but patient will also require admission.  Patient was treated with insulin , bicarb, Lokelma  and albuterol  for her hyperkalemia.  Calcium  was held because there were no significant EKG changes at this time.  CRITICAL CARE Performed by: Aidynn Polendo Total critical care time: 50 minutes Critical care time was exclusive of separately billable procedures and treating other patients. Critical care was necessary to treat or prevent imminent or life-threatening deterioration. Critical care was time spent personally by me on the following  activities: development of treatment plan with patient and/or surrogate as well as nursing, discussions with consultants, evaluation of patient's response to treatment, examination of patient, obtaining history from patient or surrogate, ordering and performing treatments and interventions, ordering and review of laboratory studies, ordering and review of radiographic studies, pulse oximetry and re-evaluation of patient's condition.

## 2024-02-20 NOTE — ED Notes (Signed)
 Pt bed linen changed, gown changed, equipment cords cleaned.

## 2024-02-20 NOTE — ED Notes (Signed)
 NT cleaned patient and changed bed linen, diaper. Pt given warm blankets.

## 2024-02-20 NOTE — ED Notes (Signed)
 Dr. Georgina aware patient unable to take PO pill due to patient not thinking she will be able to take them. Per Dr. Georgina CCM is suppose to come and evaluate this patient.

## 2024-02-20 NOTE — Consult Note (Signed)
 NAME:  Ashley Freeman, MRN:  [id number], DOB:  02-11-1991, LOS: 0 ADMISSION DATE:  02/20/2024, CONSULTATION DATE:  02/20/24 REFERRING MD:  Dr. Georgina, CHIEF COMPLAINT:  SOB/ hyperglycemia   History of Present Illness:   55 yoF with PMH of type 1 DM, ESRD on HD, HFrEF, HTN, HLD, DM, and bipolar disorder.  Treated in ER 2 days ago for hypoglycemia and hypoglycemic seizure at home.  Presented back again 10/23 feeling poorly after not taking insulin  for 2 days, vomiting, and reports of SOB.  Unclear of last iHD session, hx of poor medical compliance and frequent ER visits and admits for DKA and fluid overload.   In ER, afebrile, SBP 116-150s, normoxic, HR 80-90s, and RR in 20s.  Labs showing glucose 873, corrected Na 140, CO2 < 7, sCr 11.87, K 7.3, Alk phos 550, AST/ ALT 47/59, t.bili 2.4, lactic 2.2> 2.3, BHA > 8, WBC 15.7, H/H 8.8/ 30.6. No EKG changes.  CXR no pulmonary edema, suspected developing right pleural effusion.  Treated 1L LR bolus and then on MIVF with insulin  gtt per endotool.  Nephrology consulted, plans for iHD today.  ED RN reports one episode of coffee ground emesis.  Repeat labs pending.  PCCM consulted for concern of non-resolving DKA despite insulin  gtt.    Pertinent  Medical History  Type 1 DM, ESRD on HD, HFrEF, HTN, HLD, bioplar disorder  Significant Hospital Events: Including procedures, antibiotic start and stop dates in addition to other pertinent events   10/23 admit per [facility] DKA  Interim History / Subjective:  iHD at bedside  Objective    Blood pressure (!) 151/72, pulse 90, temperature 98.4 F (36.9 C), resp. rate 19, height 5' 3 (1.6 m), weight 77.1 kg, SpO2 99%.       No intake or output data in the 24 hours ending 02/20/24 1523 Filed Weights   02/20/24 9366  Weight: 77.1 kg   Examination: General:  AoC ill appearing adult female lying in bed in NAD HEENT: MM pink/dry, facial and orbital edema, small amount dried blood to lips, noted on  tongue as well, no stridor, pupils 4/r Neuro:  does not open eyes, responds to verbal, slowed, sleepy, oriented to person and place, will answer intermittent questions, mae spont CV: rr, no murmur, RUE AVF +b/t PULM:  tachypneic and deep, non labored, clear, RA 98% GI: taut/ distended with pitting edema, hypoBS, no obv tenderness Extremities: warm/dry, +1 pitting LE  Skin: no rashes   Resolved problem list   Assessment and Plan   DKA Type 1 DM, poorly controlled Metabolic encephalopathy  - VBG better, PH 7.21 with improving bicarb 4> 8.7 and protecting airway, is stable for PCU admit per [facility] - cont insulin  gtt per endotool with BMET q 4 and BHA - hold MIVF for now given volume overload - consult DM coordinator  ESRD  unclear last iHD, nonadherent w/ outpt therapies.  EDW 61kg.   Hyperkalemia, no EKG changes - per Nephrology  - iHD at bedside   Coffee ground emesis Anemia- H/H at baseline - repeat H/H pending  HFrEF with volume overload Congestive hepatopathy  HTN HLD 08/2023 EF 30-35%  P:  - volume removal per iHD - resume antihypertensives per [facility]  Bipolar disorder - olanzapine , lamictal , cymbalta  per [facility]   Secondary hyperparathyroidism - trend Ca/ phos levels, binders to be resumed per Nephrology    Nothing further to add.  PCCM will sign off.  Please call us  back if we can  be of any further assistance.   Labs   CBC: Recent Labs  Lab 02/18/24 0901 02/20/24 0650  WBC 15.2* 15.7*  NEUTROABS 11.0* 12.3*  HGB 9.8* 8.8*  HCT 32.1* 30.6*  MCV 89.4 95.6  PLT 390 333    Basic Metabolic Panel: Recent Labs  Lab 02/18/24 0901 02/18/24 0903 02/20/24 0650  NA 136  --  128*  K 5.1  --  7.3*  CL 102  --  91*  CO2 19*  --  <7*  GLUCOSE 165*  --  873*  BUN 86*  --  107*  CREATININE 10.45*  --  11.87*  CALCIUM  8.3*  --  8.6*  MG  --  3.2*  --    GFR: Estimated Creatinine Clearance: 6.8 mL/min (A) (by C-G formula based on SCr of 11.87 mg/dL (H)). Recent  Labs  Lab 02/18/24 0901 02/20/24 0650 02/20/24 0720 02/20/24 1009  WBC 15.2* 15.7*  --   --   LATICACIDVEN  --   --  2.2* 2.3*    Liver Function Tests: Recent Labs  Lab 02/18/24 0901 02/20/24 0650  AST 30 47*  ALT 56* 59*  ALKPHOS 559* 550*  BILITOT 0.9 2.4*  PROT 5.6* 5.6*  ALBUMIN  2.7* 3.1*   Recent Labs  Lab 02/18/24 0901 02/20/24 0935  LIPASE 20 18   No results for input(s): AMMONIA in the last 168 hours.  ABG    Component Value Date/Time   PHART 7.33 (L) 10/26/2023 1415   PCO2ART 38 10/26/2023 1415   PO2ART 67 (L) 10/26/2023 1415   HCO3 4.0 (L) 02/20/2024 1111   TCO2 25 02/03/2024 1113   ACIDBASEDEF 26.5 (H) 02/20/2024 1111   O2SAT 96.1 02/20/2024 1111     Coagulation Profile: No results for input(s): INR, PROTIME in the last 168 hours.  Cardiac Enzymes: No results for input(s): CKTOTAL, CKMB, CKMBINDEX, TROPONINI in the last 168 hours.  HbA1C: Hemoglobin A1C  Date/Time Value Ref Range Status  11/11/2013 04:31 AM 14.6 (H) 4.2 - 6.3 % Final    Comment:    The American Diabetes Association recommends that a primary goal of therapy should be <7% and that physicians should reevaluate the treatment regimen in patients with HbA1c values consistently >8%.   09/12/2011 02:53 AM SEE COMMENT 4.2 - 6.3 % Final    Comment:    The American Diabetes Association recommends that a primary goal of therapy should be <7% and that physicians should reevaluate the treatment regimen in patients with HbA1c values consistently >8%. HGB A1C - Unable to perform testing at [facility] due  - to interfering substance. HGB A1C - H -- 16.8 %  - Reference Range: 4.8-5.6  - ----------------------------------------  - Increased risk for diabetes: 5.7-6.4  - Diabetes: >6.4  - Glycemic control for adults with  - .SABRA... diabetes: <7.0  - ----------------------------------------  - PERFORMED BY [facility] [place] [facility] SPEC.NO.: [id number]  - [address] [address]  - ELSIE JULIANNA DROSS, MD  - [phone]    HbA1c, POC (controlled diabetic range)  Date/Time Value Ref Range Status  06/04/2018 09:29 AM   Final    Comment:    >15   Hgb A1c MFr Bld  Date/Time Value Ref Range Status  12/20/2023 03:05 AM 10.8 (H) 4.8 - 5.6 % Final    Comment:    (NOTE) Diagnosis of Diabetes The following HbA1c ranges recommended by the American Diabetes Association (ADA) may be used as an aid in the diagnosis of diabetes mellitus.  Hemoglobin             Suggested A1C NGSP%              Diagnosis  <5.7                   Non Diabetic  5.7-6.4                Pre-Diabetic  >6.4                   Diabetic  <7.0                   Glycemic control for                       adults with diabetes.    11/18/2023 06:30 AM 11.2 (H) 4.8 - 5.6 % Final    Comment:    (NOTE) Diagnosis of Diabetes The following HbA1c ranges recommended by the American Diabetes Association (ADA) may be used as an aid in the diagnosis of diabetes mellitus.  Hemoglobin             Suggested A1C NGSP%              Diagnosis  <5.7                   Non Diabetic  5.7-6.4                Pre-Diabetic  >6.4                   Diabetic  <7.0                   Glycemic control for                       adults with diabetes.      CBG: Recent Labs  Lab 02/20/24 1231 02/20/24 1309 02/20/24 1342 02/20/24 1421 02/20/24 1503  GLUCAP >600* >600* >600* >600* >600*    Review of Systems:   Unable   Past Medical History:  She,  has a past medical history of Abscess, gluteal, right (08/24/2013), Anemia (02/19/2012), Bartholin's gland abscess (09/19/2013), Bipolar disorder (HCC), BV (bacterial vaginosis) (11/24/2015), Depression, Diabetes mellitus type I (HCC) (2001), Diarrhea (05/30/2016), DKA (diabetic ketoacidoses) (08/19/2013), ESRD (end stage renal disease) (HCC), Gonorrhea (08/2011), HFrEF (heart failure with reduced ejection fraction) (HCC),  History of trichomoniasis (05/31/2016), Hyperlipidemia (03/28/2016), Hypertension, NICM (nonischemic cardiomyopathy) (HCC), and Sepsis (HCC) (09/19/2013).   Surgical History:   Past Surgical History:  Procedure Laterality Date   A/V FISTULAGRAM Right 06/17/2023   Procedure: A/V Fistulagram;  Surgeon: Marea Selinda RAMAN, MD;  Location: [facility] INVASIVE CV LAB;  Service: Cardiovascular;  Laterality: Right;   A/V SHUNT INTERVENTION N/A 09/25/2023   Procedure: A/V SHUNT INTERVENTION;  Surgeon: Pearline Norman RAMAN, MD;  Location: [facility] PV LAB;  Service: Cardiovascular;  Laterality: N/A;   AV FISTULA PLACEMENT Right 07/06/2022   Procedure: ARTERIOVENOUS GRAFT CREATION;  Surgeon: Gretta Lonni PARAS, MD;  Location: [facility] OR;  Service: Vascular;  Laterality: Right;   CESAREAN SECTION N/A 10/05/2019   Procedure: CESAREAN SECTION;  Surgeon: Izell Harari, MD;  Location: [facility] LD ORS;  Service: Obstetrics;  Laterality: N/A;   CHOLECYSTECTOMY N/A 07/02/2023   Procedure: LAPAROSCOPIC CHOLECYSTECTOMY;  Surgeon: Ebbie Cough, MD;  Location: [facility] OR;  Service: General;  Laterality: N/A;   ESOPHAGOGASTRODUODENOSCOPY N/A 01/31/2024   Procedure: EGD (  ESOPHAGOGASTRODUODENOSCOPY);  Surgeon: San Sandor GAILS, DO;  Location: [facility] ENDOSCOPY;  Service: Gastroenterology;  Laterality: N/A;   INCISION AND DRAINAGE ABSCESS Left 09/28/2019   Procedure: INCISION AND DRAINAGE VULVAR ABCESS;  Surgeon: Edsel Norleen GAILS, MD;  Location: [facility] OR;  Service: Gynecology;  Laterality: Left;   INCISION AND DRAINAGE PERIRECTAL ABSCESS Right 08/18/2013   Procedure: IRRIGATION AND DEBRIDEMENT GLUTEAL ABSCESS;  Surgeon: Lynda Leos, MD;  Location: [facility] OR;  Service: General;  Laterality: Right;   INCISION AND DRAINAGE PERIRECTAL ABSCESS Right 09/19/2013   Procedure: IRRIGATION AND DEBRIDEMENT RIGHT GLUTEAL AND LABIAL ABSCESSES;  Surgeon: Lynda Leos, MD;  Location: [facility] OR;  Service: General;  Laterality: Right;   INCISION AND DRAINAGE PERIRECTAL  ABSCESS Right 09/24/2013   Procedure: IRRIGATION AND DEBRIDEMENT PERIRECTAL ABSCESS;  Surgeon: Lynwood MALVA Pina, MD;  Location: [facility] OR;  Service: General;  Laterality: Right;   IR PARACENTESIS  08/28/2023   IR PARACENTESIS  11/04/2023   IR PARACENTESIS  12/23/2023   IR PARACENTESIS  02/04/2024     Social History:   reports that she has never smoked. She has never been exposed to tobacco smoke. She has never used smokeless tobacco. She reports that she does not currently use alcohol. She reports that she does not use drugs.   Family History:  Her family history includes Asthma in her mother; Carpal tunnel syndrome in her mother; Diabetes in her paternal grandmother; Gout in her father. There is no history of Anesthesia problems.   Allergies Allergies  Allergen Reactions   Keflex  [Cephalexin ] Anaphylaxis    Ceftriaxone  in the past with no reaction   Penicillins Anaphylaxis, Hives and Rash   Vibramycin  [Doxycycline ] Anaphylaxis   Benadryl  [Diphenhydramine ] Itching   Dilaudid  [Hydromorphone ] Itching   Methotrexate And Trimetrexate Rash   Roxicodone  [Oxycodone ] Itching    Takes Percocet without issue     Home Medications  Prior to Admission medications   Medication Sig Start Date End Date Taking? Authorizing Provider  albuterol  (VENTOLIN  HFA) 108 (90 Base) MCG/ACT inhaler Inhale 2 puffs into the lungs every 4 (four) hours as needed for wheezing or shortness of breath. 11/24/22   [provider]  bumetanide  (BUMEX ) 2 MG tablet Take 10 mg by mouth daily.    [provider]  calcitRIOL  (ROCALTROL ) 0.25 MCG capsule Take 5 capsules (1.25 mcg total) by mouth every Tuesday, Thursday, and Saturday at 6 PM. Patient taking differently: Take 1.25 mcg by mouth every Monday, Wednesday, and Friday. 07/09/23   Cindy Garnette POUR, MD  carvedilol  (COREG ) 25 MG tablet Take 25 mg by mouth 2 (two) times daily with a meal.    [provider]  DULoxetine  (CYMBALTA ) 20 MG capsule Take 20 mg by  mouth daily. 07/29/23   [provider]  fluticasone  (FLONASE ) 50 MCG/ACT nasal spray Place 2 sprays into both nostrils daily. 12/11/23 12/10/24  [provider]  hydrOXYzine  (ATARAX ) 25 MG tablet Take 25 mg by mouth 3 (three) times daily as needed for anxiety, itching, nausea or vomiting.    [provider]  ketoconazole  (NIZORAL ) 2 % cream Apply 1 Application topically 2 (two) times daily. Apply 1gm twice daily to affected skin on feet 01/06/24   McCaughan, Dia D, DPM  lamoTRIgine  (LAMICTAL ) 200 MG tablet Take 1 tablet (200 mg total) by mouth daily. 10/29/23   Regalado, Belkys A, MD  LANTUS  SOLOSTAR 100 UNIT/ML Solostar Pen Inject 10 Units into the skin daily. 02/07/24   Drusilla Sabas RAMAN, MD  metoCLOPramide  (REGLAN ) 10  MG tablet Take 0.5 tablets (5 mg total) by mouth every 8 (eight) hours as needed for nausea or refractory nausea / vomiting. 02/07/24   Drusilla Sabas RAMAN, MD  NOVOLOG  FLEXPEN 100 UNIT/ML FlexPen INJECT 5 UNITS UNDER THE SKIN 3 TIMES A DAY BEFORE MEALS. IF YOU DO NOT EAT A MEAL, PLEASE CHEECK YOUR BLOOD SUGAR AND GIVE YOURSELF 1 UNIT OF INSULIN  FOR EVERY 50MG /DL GREATER THAN 869. MAX 30 UNITS PER DAY. 01/18/24   [provider]  OLANZapine  zydis (ZYPREXA ) 5 MG disintegrating tablet Take 5 mg by mouth at bedtime. 09/23/23 02/08/24  [provider]  oxyCODONE -acetaminophen  (PERCOCET/ROXICET) 5-325 MG tablet Take 1 tablet by mouth every 4 (four) hours as needed for moderate pain (pain score 4-6). 01/20/24   [provider]  pantoprazole  (PROTONIX ) 40 MG tablet Take 1 tablet (40 mg total) by mouth 2 (two) times daily. 02/07/24 04/10/24  Drusilla Sabas RAMAN, MD  prochlorperazine  (COMPAZINE ) 5 MG tablet Take 5 mg by mouth every 6 (six) hours as needed for nausea or vomiting.    [provider]  rosuvastatin  (CRESTOR ) 40 MG tablet Take 40 mg by mouth daily. 01/20/24 01/19/25  [provider]  sacubitril-valsartan (ENTRESTO) 24-26 MG Take 1 tablet  by mouth 2 (two) times daily.    [provider]  sevelamer  carbonate (RENVELA ) 800 MG tablet Take 1 tablet (800 mg total) by mouth 3 (three) times daily with meals. 02/07/24   Drusilla Sabas RAMAN, MD  sodium bicarbonate  650 MG tablet Take 650 mg by mouth 2 (two) times daily. 07/29/23   [provider]  sucralfate  (CARAFATE ) 1 GM/10ML suspension Take 10 mLs (1 g total) by mouth 2 (two) times daily for 14 days. 02/07/24 02/24/24  Drusilla Sabas RAMAN, MD  SUMAtriptan  (IMITREX ) 50 MG tablet Take 50 mg by mouth every 2 (two) hours as needed for migraine or headache. 06/20/22   [provider]     Critical care time: 45 mins      Lyle Pesa, NP [facility] 02/20/2024, 4:52 PM  See Amion for pager If no response to pager , please call [phone] until 7pm After 7:00 pm call Elink  [phone]

## 2024-02-20 NOTE — ED Notes (Signed)
 Dr. Georgina aware of patient dark tinged vomiting episode.

## 2024-02-20 NOTE — Progress Notes (Signed)
 Pt receives out-pt HD at [facility]. Contacted clinic and was advised that pt was supposed to start a new TTS schedule today. Pt has an 11:00 am chair time on TTS. Nephrologist made aware of this change in days. Will assist as needed.   Randine Mungo Dialysis Navigator [phone]

## 2024-02-20 NOTE — ED Notes (Signed)
 Pt had a small BM. Pericare with soap and water  provided. Bed pad applied and brief.

## 2024-02-20 NOTE — ED Notes (Signed)
 Pt received insulin  gtt turned off. Insulin  gtt restarted.

## 2024-02-20 NOTE — ED Notes (Signed)
 Spoke with Georgina Basket, MD on the phone and made him aware of the following: patient endotool keeps generating an alert that endotool is not effective, patient refusing to take the lokelmia due to throat pain, patient BG still running high despite insulin  gtt being on since around 9am this morning. Per Dr. Georgina continue endotool as generated by endotool and Dr. Georgina will reach out to nephrology.

## 2024-02-20 NOTE — Consult Note (Signed)
 Reason for Consult: Continuity of ESRD care Referring Physician: Marsha Ada, MD [facility])  HPI:  33 year old woman with past medical history significant for bipolar disorder, congestive heart failure with reduced ejection fraction, hypertension, dyslipidemia, insulin -dependent diabetes mellitus and end-stage renal disease on hemodialysis.  She was in the emergency room 2 days ago for hypoglycemic event following which she was medically managed and discharged home but was brought in by EMS this morning with feeling poorly and not having taken her insulin  for the past 2 days.  She is found to be in diabetic ketoacidosis and is being admitted for treatment.  When seen this morning, she is largely somnolent and not volunteering any meaningful history.  Dialysis prescription: [facility]), Monday/Wednesday/Friday, 3 hours 45 minutes, BFR 450/DFR 800, right upper arm AV graft, EDW 61 kg, 2K/2.5 calcium .  Heparin  1000 unit bolus and 600 units/h.  Calcitriol  1 mcg q. hemodialysis with Sensipar  60 mg q. hemodialysis.  Also gets Tums 1500 mg q. hemodialysis.  Unclear when last dialyzed.  Past Medical History:  Diagnosis Date   Abscess, gluteal, right 08/24/2013   Anemia 02/19/2012   Bartholin's gland abscess 09/19/2013   Bipolar disorder (HCC)    BV (bacterial vaginosis) 11/24/2015   Depression    Diabetes mellitus type I (HCC) 2001   Diagnosed at age 44 ; Type I   Diarrhea 05/30/2016   DKA (diabetic ketoacidoses) 08/19/2013   Also in 2018   ESRD (end stage renal disease) (HCC)    Gonorrhea 08/2011   Treated in 09/2011   HFrEF (heart failure with reduced ejection fraction) (HCC)    a. 2022 Echo: EF 40%; b. 10/2021 Echo: EF 55%; b. 07/2022 MV: No ischemia. EF 31%; c. 08/2022 Echo: EF 35%, mildly dil RV, sev TR.   History of trichomoniasis 05/31/2016   Hyperlipidemia 03/28/2016   Hypertension    NICM (nonischemic cardiomyopathy) (HCC)    Sepsis (HCC) 09/19/2013    Past Surgical  History:  Procedure Laterality Date   A/V FISTULAGRAM Right 06/17/2023   Procedure: A/V Fistulagram;  Surgeon: Marea Selinda RAMAN, MD;  Location: [facility] INVASIVE CV LAB;  Service: Cardiovascular;  Laterality: Right;   A/V SHUNT INTERVENTION N/A 09/25/2023   Procedure: A/V SHUNT INTERVENTION;  Surgeon: Pearline Norman RAMAN, MD;  Location: [facility] PV LAB;  Service: Cardiovascular;  Laterality: N/A;   AV FISTULA PLACEMENT Right 07/06/2022   Procedure: ARTERIOVENOUS GRAFT CREATION;  Surgeon: Gretta Lonni PARAS, MD;  Location[facility] OR;  Service: Vascular;  Laterality: Right;   CESAREAN SECTION N/A 10/05/2019   Procedure: CESAREAN SECTION;  Surgeon: Izell Harari, MD;  Location: [facility] LD ORS;  Service: Obstetrics;  Laterality: N/A;   CHOLECYSTECTOMY N/A 07/02/2023   Procedure: LAPAROSCOPIC CHOLECYSTECTOMY;  Surgeon: Ebbie Cough, MD;  Location: [facility] OR;  Service: General;  Laterality: N/A;   ESOPHAGOGASTRODUODENOSCOPY N/A 01/31/2024   Procedure: EGD (ESOPHAGOGASTRODUODENOSCOPY);  Surgeon: San Sandor GAILS, DO;  Location: [facility] ENDOSCOPY;  Service: Gastroenterology;  Laterality: N/A;   INCISION AND DRAINAGE ABSCESS Left 09/28/2019   Procedure: INCISION AND DRAINAGE VULVAR ABCESS;  Surgeon: Edsel Norleen GAILS, MD;  Location: [facility] OR;  Service: Gynecology;  Laterality: Left;   INCISION AND DRAINAGE PERIRECTAL ABSCESS Right 08/18/2013   Procedure: IRRIGATION AND DEBRIDEMENT GLUTEAL ABSCESS;  Surgeon: Lynda Leos, MD;  Location: [facility] OR;  Service: General;  Laterality: Right;   INCISION AND DRAINAGE PERIRECTAL ABSCESS Right 09/19/2013   Procedure: IRRIGATION AND DEBRIDEMENT RIGHT GLUTEAL AND LABIAL ABSCESSES;  Surgeon: Lynda Leos, MD;  Location: [facility]  OR;  Service: General;  Laterality: Right;   INCISION AND DRAINAGE PERIRECTAL ABSCESS Right 09/24/2013   Procedure: IRRIGATION AND DEBRIDEMENT PERIRECTAL ABSCESS;  Surgeon: Lynwood MALVA Pina, MD;  Location: [facility] OR;  Service: General;  Laterality: Right;   IR PARACENTESIS   08/28/2023   IR PARACENTESIS  11/04/2023   IR PARACENTESIS  12/23/2023   IR PARACENTESIS  02/04/2024    Family History  Problem Relation Age of Onset   Asthma Mother    Carpal tunnel syndrome Mother    Gout Father    Diabetes Paternal Grandmother    Anesthesia problems Neg Hx     Social History:  reports that she has never smoked. She has never been exposed to tobacco smoke. She has never used smokeless tobacco. She reports that she does not currently use alcohol. She reports that she does not use drugs.  Allergies:  Allergies  Allergen Reactions   Keflex  [Cephalexin ] Anaphylaxis    Ceftriaxone  in the past with no reaction   Penicillins Anaphylaxis, Hives and Rash   Vibramycin  [Doxycycline ] Anaphylaxis   Benadryl  [Diphenhydramine ] Itching   Dilaudid  [Hydromorphone ] Itching   Methotrexate And Trimetrexate Rash   Roxicodone  [Oxycodone ] Itching    Takes Percocet without issue    Medications: Scheduled:  heparin   5,000 Units Subcutaneous Q8H   oxyCODONE -acetaminophen   1 tablet Oral Once   sodium zirconium cyclosilicate   10 g Oral Once      Latest Ref Rng & Units 02/20/2024    6:50 AM 02/18/2024    9:01 AM 02/07/2024    4:13 AM  BMP  Glucose 70 - 99 mg/dL 126  834  800   BUN 6 - 20 mg/dL 892  86  37   Creatinine 0.44 - 1.00 mg/dL 88.12  89.54  4.15   Sodium 135 - 145 mmol/L 128  136  138   Potassium 3.5 - 5.1 mmol/L 7.3  5.1  5.6   Chloride 98 - 111 mmol/L 91  102  104   CO2 22 - 32 mmol/L 7  19  23    Calcium  8.9 - 10.3 mg/dL 8.6  8.3  8.3       Latest Ref Rng & Units 02/20/2024    6:50 AM 02/18/2024    9:01 AM 02/07/2024    8:20 AM  CBC  WBC 4.0 - 10.5 K/uL 15.7  15.2  12.2   Hemoglobin 12.0 - 15.0 g/dL 8.8  9.8  9.7   Hematocrit 36.0 - 46.0 % 30.6  32.1  32.1   Platelets 150 - 400 K/uL 333  390  273     DG Chest Portable 1 View Result Date: 02/20/2024 EXAM: 1 VIEW XRAY OF THE CHEST 02/20/2024 08:53:00 AM COMPARISON: Portable chest 02/18/2024 and earlier.  CLINICAL HISTORY: 33 year old female. DKA, volume overload. History of HTN and diabetes. Not taking insulin  for 2 days, vomiting. FINDINGS: LUNGS AND PLEURA: Mildly rotated to the left. Mild new veiling right lung base opacity. Small right pleural effusion suspected. Mild linear opacity in the periphery of the left lung appears to be atelectasis. No pulmonary edema. No pneumothorax. HEART AND MEDIASTINUM: Stable cardiomegaly. BONES AND SOFT TISSUES: No acute osseous abnormality. IMPRESSION: 1. Small veiling right pleural effusion now suspected. 2. Stable cardiomegaly. Electronically signed by: Helayne Hurst MD 02/20/2024 08:58 AM EDT RP Workstation: [id number]    Review of Systems  Unable to perform ROS: Other (Patient's somnolent, unwilling to offer additional history)   Blood pressure 116/66, pulse 88, temperature 98.4 F (36.9  C), resp. rate 19, height 5' 3 (1.6 m), weight 77.1 kg, SpO2 100%. Physical Exam Vitals reviewed.  Constitutional:      Appearance: She is normal weight. She is ill-appearing.  HENT:     Head: Normocephalic and atraumatic.     Comments: Facial edema    Right Ear: External ear normal.     Left Ear: External ear normal.     Nose: Nose normal.     Mouth/Throat:     Mouth: Mucous membranes are dry.     Pharynx: Oropharynx is clear.  Eyes:     Comments: With obvious periorbital/facial edema  Cardiovascular:     Rate and Rhythm: Normal rate and regular rhythm.     Pulses: Normal pulses.     Heart sounds: Normal heart sounds.  Pulmonary:     Effort: Pulmonary effort is normal.     Breath sounds: Normal breath sounds. No wheezing or rales.  Abdominal:     General: Abdomen is flat. Bowel sounds are normal.     Palpations: Abdomen is soft.  Musculoskeletal:     Cervical back: Neck supple.     Right lower leg: Edema present.     Left lower leg: Edema present.     Comments: Trace bilateral lower extremity edema.  Palpable thrill right upper arm AV graft  Skin:     General: Skin is warm and dry.     Assessment/Plan: 1.  Diabetic ketoacidosis: Secondary to missed insulin  for the last 2 days.  Being admitted for acute management to the stepdown/telemetry unit. 2.  Hyperkalemia: Largely secondary to transcellular shifts associated with diabetic ketoacidosis.  Hemodialysis will be ordered with a 3K bath to limit rapid shifts and predisposition to dysrhythmias. 3.  End-stage renal disease: Unfortunately nonadherent with outpatient therapies.  Undertaking emergent hemodialysis today for correction of metabolic abnormalities, volume excess and azotemia. 4.  Anemia: Low hemoglobin/hematocrit likely secondary to missed hemodialysis treatments and therefore missed ESA doses.  Will monitor here while in the hospital.  No indication for PRBC transfusion. 5.  Secondary hyperparathyroidism: Will follow calcium  and phosphorus levels while you are in the hospital and resume binders when diet is resumed.  Gordy MARLA Blanch 02/20/2024, 11:20 AM

## 2024-02-20 NOTE — H&P (Signed)
 History and Physical    Patient: Ashley Freeman FMW:[id number] DOB: 07-26-1990 DOA: 02/20/2024 DOS: the patient was seen and examined on 02/20/2024 PCP: Keven Crumbly Pap, MD  Patient coming from: Home  Chief Complaint:  Chief Complaint  Patient presents with   Hyperglycemia   HPI: Ashley Freeman is a 33 y.o. female with medical history significant of ESRD HD TTS, HFrEF (EF 30-35% in 08/2023), DM1, bipolar disorder, and frequent admission for DKA/volume overload p/w hyperglycemia 2/2 DKA and SOB iso volume overload 2/2 ADHF and ESRD.   Pt is unable to provide a medical history. From what I can gather per Epic review, pt presented after feeling unwell after not taking any insulin  for 2 days. Unclear how EMS was activated, but pt BIBA to ED for evaluation.  In the ED, pt tachypneic on RA. Labs notable for VBH showing pH/CO2 of 6.96/18, Na 128, K 7.3, glucose 873, Cr 11.87, alkaline phosphatase 550, AST 47, ALT 59, T. Bili 2.4, lactate 2.2-->2.3, beta-hydroxybuterate >8, and WBC 15.7. CXR w/ volume overload, and EKG NSR. EDP consulted renal for HD, and requested medicine for admission.   Review of Systems: As mentioned in the history of present illness. All other systems reviewed and are negative. Past Medical History:  Diagnosis Date   Abscess, gluteal, right 08/24/2013   Anemia 02/19/2012   Bartholin's gland abscess 09/19/2013   Bipolar disorder (HCC)    BV (bacterial vaginosis) 11/24/2015   Depression    Diabetes mellitus type I (HCC) 2001   Diagnosed at age 22 ; Type I   Diarrhea 05/30/2016   DKA (diabetic ketoacidoses) 08/19/2013   Also in 2018   ESRD (end stage renal disease) (HCC)    Gonorrhea 08/2011   Treated in 09/2011   HFrEF (heart failure with reduced ejection fraction) (HCC)    a. 2022 Echo: EF 40%; b. 10/2021 Echo: EF 55%; b. 07/2022 MV: No ischemia. EF 31%; c. 08/2022 Echo: EF 35%, mildly dil RV, sev TR.   History of trichomoniasis  05/31/2016   Hyperlipidemia 03/28/2016   Hypertension    NICM (nonischemic cardiomyopathy) (HCC)    Sepsis (HCC) 09/19/2013   Past Surgical History:  Procedure Laterality Date   A/V FISTULAGRAM Right 06/17/2023   Procedure: A/V Fistulagram;  Surgeon: Marea Selinda GORMAN, MD;  Location: [facility] INVASIVE CV LAB;  Service: Cardiovascular;  Laterality: Right;   A/V SHUNT INTERVENTION N/A 09/25/2023   Procedure: A/V SHUNT INTERVENTION;  Surgeon: Pearline Norman GORMAN, MD;  Location: [facility];  Service: Cardiovascular;  Laterality: N/A;   AV FISTULA PLACEMENT Right 07/06/2022   Procedure: ARTERIOVENOUS GRAFT CREATION;  Surgeon: Gretta Lonni PARAS, MD;  Location: [facility] OR;  Service: Vascular;  Laterality: Right;   CESAREAN SECTION N/A 10/05/2019   Procedure: CESAREAN SECTION;  Surgeon: Izell Harari, MD;  Location: [facility] LD ORS;  Service: Obstetrics;  Laterality: N/A;   CHOLECYSTECTOMY N/A 07/02/2023   Procedure: LAPAROSCOPIC CHOLECYSTECTOMY;  Surgeon: Ebbie Cough, MD;  Location: [facility] OR;  Service: General;  Laterality: N/A;   ESOPHAGOGASTRODUODENOSCOPY N/A 01/31/2024   Procedure: EGD (ESOPHAGOGASTRODUODENOSCOPY);  Surgeon: San Sandor GAILS, DO;  Location: [facility] ENDOSCOPY;  Service: Gastroenterology;  Laterality: N/A;   INCISION AND DRAINAGE ABSCESS Left 09/28/2019   Procedure: INCISION AND DRAINAGE VULVAR ABCESS;  Surgeon: Edsel Norleen GAILS, MD;  Location: [facility] OR;  Service: Gynecology;  Laterality: Left;   INCISION AND DRAINAGE PERIRECTAL ABSCESS Right 08/18/2013   Procedure: IRRIGATION AND DEBRIDEMENT GLUTEAL ABSCESS;  Surgeon: Lynda Leos, MD;  Location:  [facility] OR;  Service: General;  Laterality: Right;   INCISION AND DRAINAGE PERIRECTAL ABSCESS Right 09/19/2013   Procedure: IRRIGATION AND DEBRIDEMENT RIGHT GLUTEAL AND LABIAL ABSCESSES;  Surgeon: Lynda Leos, MD;  Location: [facility] OR;  Service: General;  Laterality: Right;   INCISION AND DRAINAGE PERIRECTAL ABSCESS Right 09/24/2013   Procedure: IRRIGATION  AND DEBRIDEMENT PERIRECTAL ABSCESS;  Surgeon: Lynwood MALVA Pina, MD;  Location: [facility] OR;  Service: General;  Laterality: Right;   IR PARACENTESIS  08/28/2023   IR PARACENTESIS  11/04/2023   IR PARACENTESIS  12/23/2023   IR PARACENTESIS  02/04/2024   Social History:  reports that she has never smoked. She has never been exposed to tobacco smoke. She has never used smokeless tobacco. She reports that she does not currently use alcohol. She reports that she does not use drugs.  Allergies  Allergen Reactions   Keflex  [Cephalexin ] Anaphylaxis    Ceftriaxone  in the past with no reaction   Penicillins Anaphylaxis, Hives and Rash   Vibramycin  [Doxycycline ] Anaphylaxis   Benadryl  [Diphenhydramine ] Itching   Dilaudid  [Hydromorphone ] Itching   Methotrexate And Trimetrexate Rash   Roxicodone  [Oxycodone ] Itching    Takes Percocet without issue    Family History  Problem Relation Age of Onset   Asthma Mother    Carpal tunnel syndrome Mother    Gout Father    Diabetes Paternal Grandmother    Anesthesia problems Neg Hx     Prior to Admission medications   Medication Sig Start Date End Date Taking? Authorizing Provider  albuterol  (VENTOLIN  HFA) 108 (90 Base) MCG/ACT inhaler Inhale 2 puffs into the lungs every 4 (four) hours as needed for wheezing or shortness of breath. 11/24/22   [provider]  bumetanide  (BUMEX ) 2 MG tablet Take 10 mg by mouth daily.    [provider]  calcitRIOL  (ROCALTROL ) 0.25 MCG capsule Take 5 capsules (1.25 mcg total) by mouth every Tuesday, Thursday, and Saturday at 6 PM. Patient taking differently: Take 1.25 mcg by mouth every Monday, Wednesday, and Friday. 07/09/23   Cindy Garnette POUR, MD  carvedilol  (COREG ) 25 MG tablet Take 25 mg by mouth 2 (two) times daily with a meal.    [provider]  DULoxetine  (CYMBALTA ) 20 MG capsule Take 20 mg by mouth daily. 07/29/23   [provider]  fluticasone  (FLONASE ) 50 MCG/ACT nasal spray Place 2 sprays into  both nostrils daily. 12/11/23 12/10/24  [provider]  hydrOXYzine  (ATARAX ) 25 MG tablet Take 25 mg by mouth 3 (three) times daily as needed for anxiety, itching, nausea or vomiting.    [provider]  ketoconazole  (NIZORAL ) 2 % cream Apply 1 Application topically 2 (two) times daily. Apply 1gm twice daily to affected skin on feet 01/06/24   McCaughan, Dia D, DPM  lamoTRIgine  (LAMICTAL ) 200 MG tablet Take 1 tablet (200 mg total) by mouth daily. 10/29/23   Regalado, Belkys A, MD  LANTUS  SOLOSTAR 100 UNIT/ML Solostar Pen Inject 10 Units into the skin daily. 02/07/24   Drusilla Sabas RAMAN, MD  metoCLOPramide  (REGLAN ) 10 MG tablet Take 0.5 tablets (5 mg total) by mouth every 8 (eight) hours as needed for nausea or refractory nausea / vomiting. 02/07/24   Drusilla Sabas RAMAN, MD  NOVOLOG  FLEXPEN 100 UNIT/ML FlexPen INJECT 5 UNITS UNDER THE SKIN 3 TIMES A DAY BEFORE MEALS. IF YOU DO NOT EAT A MEAL, PLEASE CHEECK YOUR BLOOD SUGAR AND GIVE YOURSELF 1 UNIT OF INSULIN  FOR EVERY 50MG /DL GREATER THAN 869. MAX 30 UNITS  PER DAY. 01/18/24   [provider]  OLANZapine  zydis (ZYPREXA ) 5 MG disintegrating tablet Take 5 mg by mouth at bedtime. 09/23/23 02/08/24  [provider]  oxyCODONE -acetaminophen  (PERCOCET/ROXICET) 5-325 MG tablet Take 1 tablet by mouth every 4 (four) hours as needed for moderate pain (pain score 4-6). 01/20/24   [provider]  pantoprazole  (PROTONIX ) 40 MG tablet Take 1 tablet (40 mg total) by mouth 2 (two) times daily. 02/07/24 04/10/24  Drusilla Sabas RAMAN, MD  prochlorperazine  (COMPAZINE ) 5 MG tablet Take 5 mg by mouth every 6 (six) hours as needed for nausea or vomiting.    [provider]  rosuvastatin  (CRESTOR ) 40 MG tablet Take 40 mg by mouth daily. 01/20/24 01/19/25  [provider]  sacubitril-valsartan (ENTRESTO) 24-26 MG Take 1 tablet by mouth 2 (two) times daily.    [provider]  sevelamer  carbonate (RENVELA ) 800 MG tablet Take 1  tablet (800 mg total) by mouth 3 (three) times daily with meals. 02/07/24   Drusilla Sabas RAMAN, MD  sodium bicarbonate  650 MG tablet Take 650 mg by mouth 2 (two) times daily. 07/29/23   [provider]  sucralfate  (CARAFATE ) 1 GM/10ML suspension Take 10 mLs (1 g total) by mouth 2 (two) times daily for 14 days. 02/07/24 02/24/24  Drusilla Sabas RAMAN, MD  SUMAtriptan  (IMITREX ) 50 MG tablet Take 50 mg by mouth every 2 (two) hours as needed for migraine or headache. 06/20/22   [provider]    Physical Exam: Vitals:   02/20/24 0730 02/20/24 0745 02/20/24 1249 02/20/24 1252  BP:   130/61   Pulse: 89 88 91   Resp: (!) 36 19 19   Temp:    98.4 F (36.9 C)  SpO2: 100% 100% 100%   Weight:      Height:       General: Alert, oriented x1, resting comfortably in no acute distress Respiratory: Bibasilar rhonchi (R>L); no wheezing Cardiovascular: Regular rate and rhythm w/o m/r/g   Data Reviewed:  Lab Results  Component Value Date   WBC 15.7 (H) 02/20/2024   HGB 8.8 (L) 02/20/2024   HCT 30.6 (L) 02/20/2024   MCV 95.6 02/20/2024   PLT 333 02/20/2024   Lab Results  Component Value Date   GLUCOSE 873 (HH) 02/20/2024   CALCIUM  8.6 (L) 02/20/2024   NA 128 (L) 02/20/2024   K 7.3 (HH) 02/20/2024   CO2 <7 (L) 02/20/2024   CL 91 (L) 02/20/2024   BUN 107 (H) 02/20/2024   CREATININE 11.87 (H) 02/20/2024   Lab Results  Component Value Date   ALT 59 (H) 02/20/2024   AST 47 (H) 02/20/2024   ALKPHOS 550 (H) 02/20/2024   BILITOT 2.4 (H) 02/20/2024   Lab Results  Component Value Date   INR 1.3 (H) 02/04/2024   INR 1.3 (H) 12/29/2023   INR 1.5 (H) 11/17/2023   Radiology: DG Chest Portable 1 View Result Date: 02/20/2024 EXAM: 1 VIEW XRAY OF THE CHEST 02/20/2024 08:53:00 AM COMPARISON: Portable chest 02/18/2024 and earlier. CLINICAL HISTORY: 33 year old female. DKA, volume overload. History of HTN and diabetes. Not taking insulin  for 2 days, vomiting. FINDINGS: LUNGS AND PLEURA:  Mildly rotated to the left. Mild new veiling right lung base opacity. Small right pleural effusion suspected. Mild linear opacity in the periphery of the left lung appears to be atelectasis. No pulmonary edema. No pneumothorax. HEART AND MEDIASTINUM: Stable cardiomegaly. BONES AND SOFT TISSUES: No acute osseous abnormality. IMPRESSION: 1. Small veiling right pleural effusion  now suspected. 2. Stable cardiomegaly. Electronically signed by: Helayne Hurst MD 02/20/2024 08:58 AM EDT RP Workstation: [id number]    Assessment and Plan: 47F h/o ESRD HD TTS, HFrEF (EF 30-35% in 08/2023), DM1, bipolar disorder, and frequent admission for DKA/volume overload p/w hyperglycemia 2/2 DKA and SOB iso volume overload 2/2 ADHF and ESRD.  DKA H/o DM1 -IV insulin  per Endotool protocol -NPO for now; admit to PCU   SOB iso volume overload 2/2 ADHF and ESRD -Nephrology consulted; apprec eval/recs (emergent HD planned for today) -PTA calcitriol , Renvela , and sodium bicarbonate    HFrEF exacerbation c/b ESRD -HD per renal above -PTA Bumex  2mg  dialy -PTA Coreg  BID, Entresto BID, and crestor  daily  Congestive hepatopathy -HD per renal and diuresis per above -CTM   BPD -PTA olanzapine , lamictal  and Cymbalta  -F/u lamictal  level    Advance Care Planning:   Code Status: Full Code   Consults: Renal  Family Communication: N/A  Severity of Illness: The appropriate patient status for this patient is INPATIENT. Inpatient status is judged to be reasonable and necessary in order to provide the required intensity of service to ensure the patient's safety. The patient's presenting symptoms, physical exam findings, and initial radiographic and laboratory data in the context of their chronic comorbidities is felt to place them at high risk for further clinical deterioration. Furthermore, it is not anticipated that the patient will be medically stable for discharge from the hospital within 2 midnights of admission.   * I  certify that at the point of admission it is my clinical judgment that the patient will require inpatient hospital care spanning beyond 2 midnights from the point of admission due to high intensity of service, high risk for further deterioration and high frequency of surveillance required.*  ------- I spent 55 minutes reviewing previous notes, at the bedside counseling/discussing the treatment plan, and performing clinical documentation.  Author: Marsha Ada, MD 02/20/2024 2:05 PM  For on call review [email or web address].

## 2024-02-20 NOTE — Inpatient Diabetes Management (Signed)
 Inpatient Diabetes Program Recommendations  AACE/ADA: New Consensus Statement on Inpatient Glycemic Control (2015)  Target Ranges:  Prepandial:   less than 140 mg/dL      Peak postprandial:   less than 180 mg/dL (1-2 hours)      Critically ill patients:  140 - 180 mg/dL   Lab Results  Component Value Date   GLUCAP >600 (HH) 02/20/2024   HGBA1C 10.8 (H) 12/20/2023    Latest Reference Range & Units 02/20/24 06:50  Sodium 135 - 145 mmol/L 128 (L)  Potassium 3.5 - 5.1 mmol/L 7.3 (HH)  Chloride 98 - 111 mmol/L 91 (L)  CO2 22 - 32 mmol/L <7 (L)  Glucose 70 - 99 mg/dL 126 (HH)  BUN 6 - 20 mg/dL 892 (H)  Creatinine 9.55 - 1.00 mg/dL 88.12 (H)  Calcium  8.9 - 10.3 mg/dL 8.6 (L)  Anion gap 5 - 15  NOT CALCULATED  (HH): Data is critically high (L): Data is abnormally low (H): Data is abnormally high  Diabetes history: DM1 Outpatient Diabetes medications: Lantus  10 every day, Novolog  5 units TID Current orders for Inpatient glycemic control: IV Insulin   Inpatient Diabetes Program Recommendations:   Noted patient did not take insulin  x 2 days prior to admission and has type 1 diabetes.  Patient has had 11 hospital admissions over the past 6 months and 13 ED admissions.  Thank you, Aivy Akter E. Analaura Messler, RN, MSN, CNS, CDCES  Diabetes Coordinator Inpatient Glycemic Control Team Team Pager [phone] (8am-5pm) 02/20/2024 10:51 AM

## 2024-02-20 NOTE — ED Notes (Signed)
 Pt transferred to 47 for bedside hemodialysis

## 2024-02-21 DIAGNOSIS — F319 Bipolar disorder, unspecified: Secondary | ICD-10-CM

## 2024-02-21 DIAGNOSIS — E875 Hyperkalemia: Secondary | ICD-10-CM | POA: Diagnosis not present

## 2024-02-21 DIAGNOSIS — E1022 Type 1 diabetes mellitus with diabetic chronic kidney disease: Secondary | ICD-10-CM | POA: Diagnosis not present

## 2024-02-21 DIAGNOSIS — Z992 Dependence on renal dialysis: Secondary | ICD-10-CM

## 2024-02-21 DIAGNOSIS — I5020 Unspecified systolic (congestive) heart failure: Secondary | ICD-10-CM

## 2024-02-21 DIAGNOSIS — E1010 Type 1 diabetes mellitus with ketoacidosis without coma: Secondary | ICD-10-CM | POA: Diagnosis not present

## 2024-02-21 DIAGNOSIS — N186 End stage renal disease: Secondary | ICD-10-CM

## 2024-02-21 LAB — GLUCOSE, CAPILLARY
Glucose-Capillary: 108 mg/dL — ABNORMAL HIGH (ref 70–99)
Glucose-Capillary: 120 mg/dL — ABNORMAL HIGH (ref 70–99)
Glucose-Capillary: 120 mg/dL — ABNORMAL HIGH (ref 70–99)
Glucose-Capillary: 125 mg/dL — ABNORMAL HIGH (ref 70–99)
Glucose-Capillary: 125 mg/dL — ABNORMAL HIGH (ref 70–99)
Glucose-Capillary: 126 mg/dL — ABNORMAL HIGH (ref 70–99)
Glucose-Capillary: 127 mg/dL — ABNORMAL HIGH (ref 70–99)
Glucose-Capillary: 129 mg/dL — ABNORMAL HIGH (ref 70–99)
Glucose-Capillary: 132 mg/dL — ABNORMAL HIGH (ref 70–99)
Glucose-Capillary: 144 mg/dL — ABNORMAL HIGH (ref 70–99)
Glucose-Capillary: 146 mg/dL — ABNORMAL HIGH (ref 70–99)
Glucose-Capillary: 150 mg/dL — ABNORMAL HIGH (ref 70–99)
Glucose-Capillary: 151 mg/dL — ABNORMAL HIGH (ref 70–99)
Glucose-Capillary: 166 mg/dL — ABNORMAL HIGH (ref 70–99)
Glucose-Capillary: 176 mg/dL — ABNORMAL HIGH (ref 70–99)
Glucose-Capillary: 178 mg/dL — ABNORMAL HIGH (ref 70–99)
Glucose-Capillary: 192 mg/dL — ABNORMAL HIGH (ref 70–99)
Glucose-Capillary: 200 mg/dL — ABNORMAL HIGH (ref 70–99)

## 2024-02-21 LAB — BASIC METABOLIC PANEL WITH GFR
Anion gap: 19 — ABNORMAL HIGH (ref 5–15)
Anion gap: 19 — ABNORMAL HIGH (ref 5–15)
BUN: 44 mg/dL — ABNORMAL HIGH (ref 6–20)
BUN: 46 mg/dL — ABNORMAL HIGH (ref 6–20)
CO2: 19 mmol/L — ABNORMAL LOW (ref 22–32)
CO2: 20 mmol/L — ABNORMAL LOW (ref 22–32)
Calcium: 8.3 mg/dL — ABNORMAL LOW (ref 8.9–10.3)
Calcium: 8.6 mg/dL — ABNORMAL LOW (ref 8.9–10.3)
Chloride: 96 mmol/L — ABNORMAL LOW (ref 98–111)
Chloride: 96 mmol/L — ABNORMAL LOW (ref 98–111)
Creatinine, Ser: 5.8 mg/dL — ABNORMAL HIGH (ref 0.44–1.00)
Creatinine, Ser: 5.91 mg/dL — ABNORMAL HIGH (ref 0.44–1.00)
GFR, Estimated: 9 mL/min — ABNORMAL LOW (ref >60)
GFR, Estimated: 9 mL/min — ABNORMAL LOW (ref >60)
Glucose, Bld: 179 mg/dL — ABNORMAL HIGH (ref 70–99)
Glucose, Bld: 119 mg/dL — ABNORMAL HIGH (ref 70–99)
Potassium: 4.6 mmol/L (ref 3.5–5.1)
Potassium: 4.3 mmol/L (ref 3.5–5.1)
Sodium: 134 mmol/L — ABNORMAL LOW (ref 135–145)
Sodium: 135 mmol/L (ref 135–145)

## 2024-02-21 LAB — CBC
HCT: 28.2 % — ABNORMAL LOW (ref 36.0–46.0)
Hemoglobin: 9 g/dL — ABNORMAL LOW (ref 12.0–15.0)
MCH: 26.9 pg (ref 26.0–34.0)
MCHC: 31.9 g/dL (ref 30.0–36.0)
MCV: 84.4 fL (ref 80.0–100.0)
Platelets: 265 K/uL (ref 150–400)
RBC: 3.34 MIL/uL — ABNORMAL LOW (ref 3.87–5.11)
RDW: 15.5 % (ref 11.5–15.5)
WBC: 13.8 K/uL — ABNORMAL HIGH (ref 4.0–10.5)
nRBC: 0 % (ref 0.0–0.2)

## 2024-02-21 LAB — BETA-HYDROXYBUTYRIC ACID: Beta-Hydroxybutyric Acid: 1.82 mmol/L — ABNORMAL HIGH (ref 0.05–0.27)

## 2024-02-21 LAB — CBG MONITORING, ED: Glucose-Capillary: 125 mg/dL — ABNORMAL HIGH (ref 70–99)

## 2024-02-21 MED ORDER — CHLORHEXIDINE GLUCONATE CLOTH 2 % EX PADS
6.0000 | MEDICATED_PAD | Freq: Every day | CUTANEOUS | Status: DC
  Administered 2024-02-21 – 2024-02-23 (×3): 6 via TOPICAL

## 2024-02-21 NOTE — Assessment & Plan Note (Signed)
 Likely secondary to missing dialysis and DKA.  Potassium of 7.3 on admission, she was taken emergently for dialysis and now potassium improved to 4.3. -Continue to monitor

## 2024-02-21 NOTE — Assessment & Plan Note (Signed)
 Secondary to not using insulin  for the past 2 days, multiple ED visits and hospitalizations for the similar reason.  History of chronic noncompliant.  Still having a gap of 19 so continuing insulin  infusion although blood glucose seems improving.  - Continue with insulin  infusion -We will transition to basal and short acting once gap closed x 2 -Continue with supportive care

## 2024-02-21 NOTE — Inpatient Diabetes Management (Signed)
 Inpatient Diabetes Program Recommendations  AACE/ADA: New Consensus Statement on Inpatient Glycemic Control (2015)  Target Ranges:  Prepandial:   less than 140 mg/dL      Peak postprandial:   less than 180 mg/dL (1-2 hours)      Critically ill patients:  140 - 180 mg/dL   Lab Results  Component Value Date   GLUCAP 144 (H) 02/21/2024   HGBA1C 10.8 (H) 12/20/2023    Review of Glycemic Control  Diabetes history: type 1 Outpatient Diabetes medications: Lantus  10 units daily, Novolog  5 units TID with meals; if not eating, 1 units insulin  for every 50 mg/dl greater than 869 mg/dl.  Current orders for Inpatient glycemic control: IV insulin    Inpatient Diabetes Program Recommendations:    When ready to transition consider: - Lantus  to 8 units two hours prior to discontinuation then daily to follow - Novolog  0-9 units correction scale to TID - Adding Novolog  0-5 units HS scale, and adding Novolog  3 units TID with meals if eating at least 50% of meal.  - beta hydroxybutyric acid Q4H   Thanks, Tinnie Minus, MSN, RNC-OB Diabetes Coordinator [phone] (8a-5p)

## 2024-02-21 NOTE — Assessment & Plan Note (Signed)
-   Continue home olanzapine , Lamictal  and Cymbalta  -Follow-up Lamictal  levels-still pending

## 2024-02-21 NOTE — Progress Notes (Signed)
 Progress Note   Patient: Ashley Freeman DOB: 22-Jun-1990 DOA: 02/20/2024     1 DOS: the patient was seen and examined on 02/21/2024   Brief hospital course: Partly taken from prior notes.   Ashley Freeman is a 33 y.o. female with medical history significant of ESRD HD TTS, HFrEF (EF 30-35% in 08/2023), DM1, bipolar disorder, and frequent admission for DKA/volume overload p/w hyperglycemia 2/2 DKA and SOB iso volume overload 2/2 ADHF and ESRD.   Multiple prior hospitalization for similar reason.  In the ED, pt tachypneic on RA. Labs notable for VBH showing pH/CO2 of 6.96/18, Na 128, K 7.3, glucose 873, Cr 11.87, alkaline phosphatase 550, AST 47, ALT 59, T. Bili 2.4, lactate 2.2-->2.3, beta-hydroxybuterate >8, and WBC 15.7. CXR w/ volume overload, and EKG NSR.   PCCM was also consulted.  10/25: Vital Stable, improving CBG, gap still open at 19, continuing insulin  gtt. had HD last night.  Assessment and Plan: * DKA (diabetic ketoacidosis) (HCC) Secondary to not using insulin  for the past 2 days, multiple ED visits and hospitalizations for the similar reason.  History of chronic noncompliant.  Still having a gap of 19 so continuing insulin  infusion although blood glucose seems improving.  - Continue with insulin  infusion -We will transition to basal and short acting once gap closed x 2 -Continue with supportive care  Diabetes mellitus type I (HCC) History of type 1 diabetes and being noncompliant and resulted in multiple DKA - Management as above  Hyperkalemia Likely secondary to missing dialysis and DKA.  Potassium of 7.3 on admission, she was taken emergently for dialysis and now potassium improved to 4.3. -Continue to monitor  HFrEF (heart failure with reduced ejection fraction) (HCC) History of nonischemic cardiomyopathy with EF of 30 to 35% on echo done in May 2025.  On admission significantly volume overload with missed  dialysis.  Volume is being managed with dialysis.  This morning looks euvolemic after getting dialysis and removal of 3 L of fluid.  - Continue home Bumex  and carvedilol   ESRD (end stage renal disease) (HCC) On TTS schedule, received emergent dialysis yesterday.  History of multiple missed dialysis. - Next dialysis will be on 02/22/2024 -Nephrology is on board and managing  Bipolar disorder (HCC) - Continue home olanzapine , Lamictal  and Cymbalta  -Follow-up Lamictal  levels-still pending   Subjective: Patient seems quite somnolent but denies any new concern when seen today.  Physical Exam: Vitals:   02/20/24 2230 02/21/24 0317 02/21/24 0400 02/21/24 1052  BP: (!) 167/100 (!) 143/81  (!) (P) 146/89  Pulse: 91 89 91   Resp: 14 20  (P) 20  Temp:      SpO2: 95%   (P) 95%  Weight:      Height:       General.  Lethargic lady, in no acute distress. Pulmonary.  Lungs clear bilaterally, normal respiratory effort. CV.  Regular rate and rhythm, no JVD, rub or murmur. Abdomen.  Soft, nontender, nondistended, BS positive. CNS.  Somnolent.  No focal neurologic deficit. Extremities.  No edema, no cyanosis, pulses intact and symmetrical.  Data Reviewed: Prior data reviewed  Family Communication: Tried calling mother with no response, no voicemail left on a generic message.  Disposition: Status is: Inpatient Remains inpatient appropriate because: Severity of illness  Planned Discharge Destination: Home  DVT prophylaxis.  Subcu heparin  Time spent: 50 minutes  This record has been created using Conservation officer, historic buildings. Errors have been sought and corrected,but may not always be  located. Such creation errors do not reflect on the standard of care.   Author: Amaryllis Dare, MD 02/21/2024 11:19 AM  For on call review [email or web address].

## 2024-02-21 NOTE — Assessment & Plan Note (Signed)
 History of type 1 diabetes and being noncompliant and resulted in multiple DKA - Management as above

## 2024-02-21 NOTE — Progress Notes (Signed)
 [facility] Progress Note   Subjective:    Seen and examined patient at bedside. Tolerated HD last night with net UF 3L. BUN improved-now 46. She remains somnolent. On insulin  drip. On RA. Next HD 10/25.  Objective Vitals:   02/20/24 2034 02/20/24 2230 02/21/24 0317 02/21/24 0400  BP: (!) 181/96 (!) 167/100 (!) 143/81   Pulse: 93 91 89 91  Resp: (!) 27 14 20    Temp: 99.1 F (37.3 C)     SpO2: 98% 95%    Weight:      Height:       Physical Exam General: Somnolent, on RA, NAD Heart: S1 and S2; No MRGs Lungs: Clear throughout; No wheezing, rales, or rhonchi Abdomen: Soft and non-tender Extremities: Trace b/l ankle edema Dialysis Access: R AVG   Filed Weights   02/20/24 0633  Weight: 77.1 kg    Intake/Output Summary (Last 24 hours) at 02/21/2024 1034 Last data filed at 02/20/2024 2034 Gross per 24 hour  Intake 692.09 ml  Output 3000 ml  Net -2307.91 ml    Additional Objective Labs: Basic Metabolic Panel: Recent Labs  Lab 02/20/24 2237 02/21/24 0349 02/21/24 0743  NA 135 134* 135  K 4.1 4.6 4.3  CL 96* 96* 96*  CO2 22 19* 20*  GLUCOSE 161* 179* 119*  BUN 44* 44* 46*  CREATININE 5.74* 5.80* 5.91*  CALCIUM  8.3* 8.3* 8.6*   Liver Function Tests: Recent Labs  Lab 02/18/24 0901 02/20/24 0650  AST 30 47*  ALT 56* 59*  ALKPHOS 559* 550*  BILITOT 0.9 2.4*  PROT 5.6* 5.6*  ALBUMIN  2.7* 3.1*   Recent Labs  Lab 02/18/24 0901 02/20/24 0935  LIPASE 20 18   CBC: Recent Labs  Lab 02/18/24 0901 02/20/24 0650 02/21/24 0349  WBC 15.2* 15.7* 13.8*  NEUTROABS 11.0* 12.3*  --   HGB 9.8* 8.8* 9.0*  HCT 32.1* 30.6* 28.2*  MCV 89.4 95.6 84.4  PLT 390 333 265   Blood Culture    Component Value Date/Time   SDES BLOOD LEFT HAND 01/26/2024 1829   SPECREQUEST  01/26/2024 1829    BOTTLES DRAWN AEROBIC AND ANAEROBIC Blood Culture adequate volume   CULT  01/26/2024 1829    NO GROWTH 5 DAYS Performed at [facility], [address] [place], [place] [address]    REPTSTATUS 01/31/2024 FINAL 01/26/2024 1829    Cardiac Enzymes: No results for input(s): CKTOTAL, CKMB, CKMBINDEX, TROPONINI in the last 168 hours. CBG: Recent Labs  Lab 02/21/24 0507 02/21/24 0610 02/21/24 0718 02/21/24 0818 02/21/24 1030  GLUCAP 200* 166* 125* 129* 108*   Iron  Studies: No results for input(s): IRON , TIBC, TRANSFERRIN, FERRITIN in the last 72 hours. Lab Results  Component Value Date   INR 1.3 (H) 02/04/2024   INR 1.3 (H) 12/29/2023   INR 1.5 (H) 11/17/2023   Studies/Results: DG Chest Portable 1 View Result Date: 02/20/2024 EXAM: 1 VIEW XRAY OF THE CHEST 02/20/2024 08:53:00 AM COMPARISON: Portable chest 02/18/2024 and earlier. CLINICAL HISTORY: 33 year old female. DKA, volume overload. History of HTN and diabetes. Not taking insulin  for 2 days, vomiting. FINDINGS: LUNGS AND PLEURA: Mildly rotated to the left. Mild new veiling right lung base opacity. Small right pleural effusion suspected. Mild linear opacity in the periphery of the left lung appears to be atelectasis. No pulmonary edema. No pneumothorax. HEART AND MEDIASTINUM: Stable cardiomegaly. BONES AND SOFT TISSUES: No acute osseous abnormality. IMPRESSION: 1. Small veiling right pleural effusion now suspected. 2. Stable cardiomegaly.  Electronically signed by: Helayne Hurst MD 02/20/2024 08:58 AM EDT RP Workstation: [id number]    Medications:  dextrose  10 % and 0.45 % NaCl 40 mL/hr at 02/20/24 1952   insulin  Stopped (02/21/24 1032)    bumetanide   10 mg Oral Daily   calcitRIOL   1.25 mcg Oral Q M,W,F   carvedilol   25 mg Oral BID WC   DULoxetine   20 mg Oral Daily   fluticasone   2 spray Each Nare Daily   heparin   5,000 Units Subcutaneous Q8H   lamoTRIgine   200 mg Oral Daily   OLANZapine  zydis  5 mg Oral QHS   oxyCODONE -acetaminophen   1 tablet Oral Once   rosuvastatin   40 mg Oral Daily   sevelamer  carbonate  800 mg Oral TID WC    Dialysis Orders: [facility] Monday/Wednesday/Friday, 3 hours 45 minutes, BFR 450/DFR 800, right upper arm AV graft, EDW 61 kg, 2K/2.5 calcium . Heparin  1000 unit bolus and 600 units/h. Calcitriol  1 mcg q. hemodialysis with Sensipar  60 mg q. hemodialysis. Also gets Tums 1500 mg q. hemodialysis. Unclear when last dialyzed.   Assessment/Plan: 1.  Diabetic ketoacidosis: Secondary to missed insulin  for the last 2 days.  Being admitted for acute management to the stepdown/telemetry unit. 2.  Hyperkalemia: Largely secondary to transcellular shifts associated with diabetic ketoacidosis.  Received HD last night with 3K bath. K+ now stable at 4.3. 3.  End-stage renal disease: Unfortunately nonadherent with outpatient therapies. Received urgently HD yesterday-3L removed and BUN improved-now 46. Next HD 10/25. 4.  Anemia: Low hemoglobin/hematocrit likely secondary to missed hemodialysis treatments and therefore missed ESA doses.  Will monitor here while in the hospital.  No indication for PRBC transfusion. 5.  Secondary hyperparathyroidism: Will follow calcium  and phosphorus levels while you are in the hospital and resume binders when diet is resumed. 6. Dispo: Remains inpatient  Charmaine Piety, NP [facility] 02/21/2024,10:34 AM  LOS: 1 day

## 2024-02-21 NOTE — Plan of Care (Signed)

## 2024-02-21 NOTE — Assessment & Plan Note (Signed)
 On TTS schedule, received emergent dialysis yesterday.  History of multiple missed dialysis. - Next dialysis will be on 02/22/2024 -Nephrology is on board and managing

## 2024-02-21 NOTE — Assessment & Plan Note (Signed)
 History of nonischemic cardiomyopathy with EF of 30 to 35% on echo done in May 2025.  On admission significantly volume overload with missed dialysis.  Volume is being managed with dialysis.  This morning looks euvolemic after getting dialysis and removal of 3 L of fluid.  - Continue home Bumex  and carvedilol 

## 2024-02-21 NOTE — Hospital Course (Addendum)
 Partly taken from prior notes.   Ashley Freeman is a 33 y.o. female with medical history significant of ESRD HD TTS, HFrEF (EF 30-35% in 08/2023), DM1, bipolar disorder, and frequent admission for DKA/volume overload p/w hyperglycemia 2/2 DKA and SOB iso volume overload 2/2 ADHF and ESRD.   Multiple prior hospitalization for similar reason.  In the ED, pt tachypneic on RA. Labs notable for VBH showing pH/CO2 of 6.96/18, Na 128, K 7.3, glucose 873, Cr 11.87, alkaline phosphatase 550, AST 47, ALT 59, T. Bili 2.4, lactate 2.2-->2.3, beta-hydroxybuterate >8, and WBC 15.7. CXR w/ volume overload, and EKG NSR.   PCCM was also consulted.  10/25: Vital Stable, improving CBG, gap still open at 19, continuing insulin  gtt. had HD last night.

## 2024-02-22 ENCOUNTER — Inpatient Hospital Stay (HOSPITAL_COMMUNITY)

## 2024-02-22 DIAGNOSIS — L0211 Cutaneous abscess of neck: Secondary | ICD-10-CM

## 2024-02-22 DIAGNOSIS — J029 Acute pharyngitis, unspecified: Secondary | ICD-10-CM

## 2024-02-22 DIAGNOSIS — E1010 Type 1 diabetes mellitus with ketoacidosis without coma: Secondary | ICD-10-CM | POA: Diagnosis not present

## 2024-02-22 LAB — CBC WITH DIFFERENTIAL/PLATELET
Abs Immature Granulocytes: 0.07 K/uL (ref 0.00–0.07)
Basophils Absolute: 0.1 K/uL (ref 0.0–0.1)
Basophils Relative: 1 %
Eosinophils Absolute: 0.4 K/uL (ref 0.0–0.5)
Eosinophils Relative: 3 %
HCT: 29 % — ABNORMAL LOW (ref 36.0–46.0)
Hemoglobin: 9.2 g/dL — ABNORMAL LOW (ref 12.0–15.0)
Immature Granulocytes: 1 %
Lymphocytes Relative: 25 %
Lymphs Abs: 3.2 K/uL (ref 0.7–4.0)
MCH: 26.9 pg (ref 26.0–34.0)
MCHC: 31.7 g/dL (ref 30.0–36.0)
MCV: 84.8 fL (ref 80.0–100.0)
Monocytes Absolute: 0.7 K/uL (ref 0.1–1.0)
Monocytes Relative: 6 %
Neutro Abs: 8.2 K/uL — ABNORMAL HIGH (ref 1.7–7.7)
Neutrophils Relative %: 64 %
Platelets: 328 K/uL (ref 150–400)
RBC: 3.42 MIL/uL — ABNORMAL LOW (ref 3.87–5.11)
RDW: 15.8 % — ABNORMAL HIGH (ref 11.5–15.5)
WBC: 12.7 K/uL — ABNORMAL HIGH (ref 4.0–10.5)
nRBC: 0 % (ref 0.0–0.2)

## 2024-02-22 LAB — LAMOTRIGINE LEVEL: Lamotrigine Lvl: <1 ug/mL — ABNORMAL LOW (ref 2.0–20.0)

## 2024-02-22 LAB — BETA-HYDROXYBUTYRIC ACID
Beta-Hydroxybutyric Acid: 1.23 mmol/L — ABNORMAL HIGH (ref 0.05–0.27)
Beta-Hydroxybutyric Acid: 0.22 mmol/L (ref 0.05–0.27)

## 2024-02-22 LAB — BASIC METABOLIC PANEL WITH GFR
Anion gap: 17 — ABNORMAL HIGH (ref 5–15)
Anion gap: 17 — ABNORMAL HIGH (ref 5–15)
BUN: 52 mg/dL — ABNORMAL HIGH (ref 6–20)
BUN: 56 mg/dL — ABNORMAL HIGH (ref 6–20)
CO2: 19 mmol/L — ABNORMAL LOW (ref 22–32)
CO2: 20 mmol/L — ABNORMAL LOW (ref 22–32)
Calcium: 8.4 mg/dL — ABNORMAL LOW (ref 8.9–10.3)
Calcium: 8.6 mg/dL — ABNORMAL LOW (ref 8.9–10.3)
Chloride: 96 mmol/L — ABNORMAL LOW (ref 98–111)
Chloride: 96 mmol/L — ABNORMAL LOW (ref 98–111)
Creatinine, Ser: 6.88 mg/dL — ABNORMAL HIGH (ref 0.44–1.00)
Creatinine, Ser: 7.36 mg/dL — ABNORMAL HIGH (ref 0.44–1.00)
GFR, Estimated: 8 mL/min — ABNORMAL LOW (ref >60)
GFR, Estimated: 7 mL/min — ABNORMAL LOW (ref >60)
Glucose, Bld: 195 mg/dL — ABNORMAL HIGH (ref 70–99)
Glucose, Bld: 168 mg/dL — ABNORMAL HIGH (ref 70–99)
Potassium: 5 mmol/L (ref 3.5–5.1)
Potassium: 4.5 mmol/L (ref 3.5–5.1)
Sodium: 132 mmol/L — ABNORMAL LOW (ref 135–145)
Sodium: 133 mmol/L — ABNORMAL LOW (ref 135–145)

## 2024-02-22 LAB — GLUCOSE, CAPILLARY
Glucose-Capillary: 105 mg/dL — ABNORMAL HIGH (ref 70–99)
Glucose-Capillary: 113 mg/dL — ABNORMAL HIGH (ref 70–99)
Glucose-Capillary: 140 mg/dL — ABNORMAL HIGH (ref 70–99)
Glucose-Capillary: 140 mg/dL — ABNORMAL HIGH (ref 70–99)
Glucose-Capillary: 157 mg/dL — ABNORMAL HIGH (ref 70–99)
Glucose-Capillary: 166 mg/dL — ABNORMAL HIGH (ref 70–99)
Glucose-Capillary: 166 mg/dL — ABNORMAL HIGH (ref 70–99)
Glucose-Capillary: 167 mg/dL — ABNORMAL HIGH (ref 70–99)
Glucose-Capillary: 179 mg/dL — ABNORMAL HIGH (ref 70–99)
Glucose-Capillary: 180 mg/dL — ABNORMAL HIGH (ref 70–99)
Glucose-Capillary: 181 mg/dL — ABNORMAL HIGH (ref 70–99)
Glucose-Capillary: 191 mg/dL — ABNORMAL HIGH (ref 70–99)
Glucose-Capillary: 192 mg/dL — ABNORMAL HIGH (ref 70–99)
Glucose-Capillary: 197 mg/dL — ABNORMAL HIGH (ref 70–99)
Glucose-Capillary: 197 mg/dL — ABNORMAL HIGH (ref 70–99)
Glucose-Capillary: 210 mg/dL — ABNORMAL HIGH (ref 70–99)
Glucose-Capillary: 213 mg/dL — ABNORMAL HIGH (ref 70–99)
Glucose-Capillary: 217 mg/dL — ABNORMAL HIGH (ref 70–99)
Glucose-Capillary: 234 mg/dL — ABNORMAL HIGH (ref 70–99)
Glucose-Capillary: 240 mg/dL — ABNORMAL HIGH (ref 70–99)
Glucose-Capillary: 247 mg/dL — ABNORMAL HIGH (ref 70–99)
Glucose-Capillary: 94 mg/dL (ref 70–99)

## 2024-02-22 LAB — PROTIME-INR
INR: 1.2 (ref 0.8–1.2)
Prothrombin Time: 15.6 s — ABNORMAL HIGH (ref 11.4–15.2)

## 2024-02-22 LAB — C-REACTIVE PROTEIN: CRP: 4.2 mg/dL — ABNORMAL HIGH (ref <1.0)

## 2024-02-22 LAB — HEPATITIS B SURFACE ANTIGEN: Hepatitis B Surface Ag: NONREACTIVE

## 2024-02-22 LAB — TYPE AND SCREEN
ABO/RH(D): A POS
Antibody Screen: NEGATIVE

## 2024-02-22 LAB — HEPATITIS B SURFACE ANTIBODY,QUALITATIVE: Hep B S Ab: NONREACTIVE

## 2024-02-22 LAB — PROCALCITONIN: Procalcitonin: 7.39 ng/mL

## 2024-02-22 LAB — TROPONIN I (HIGH SENSITIVITY): Troponin I (High Sensitivity): 55 ng/L — ABNORMAL HIGH (ref <18)

## 2024-02-22 MED ORDER — NALOXONE HCL 0.4 MG/ML IJ SOLN: 0.4000 mg | INTRAMUSCULAR | Status: DC | PRN

## 2024-02-22 MED ORDER — OXYCODONE HCL 5 MG PO TABS
5.0000 mg | ORAL_TABLET | Freq: Once | ORAL | Status: AC | PRN
  Administered 2024-02-22: 5 mg via ORAL
  Filled 2024-02-22: qty 1

## 2024-02-22 MED ORDER — IOHEXOL 350 MG/ML SOLN
75.0000 mL | Freq: Once | INTRAVENOUS | Status: AC | PRN
  Administered 2024-02-22: 75 mL via INTRAVENOUS

## 2024-02-22 MED ORDER — DEXAMETHASONE SOD PHOSPHATE PF 10 MG/ML IJ SOLN
8.0000 mg | INTRAMUSCULAR | Status: AC
  Administered 2024-02-22: 8 mg via INTRAVENOUS

## 2024-02-22 MED ORDER — DEXTROSE 5 % IV SOLN
0.5000 g | Freq: Once | INTRAVENOUS | Status: AC
  Administered 2024-02-22: 0.5 g via INTRAVENOUS
  Filled 2024-02-22: qty 2.5

## 2024-02-22 MED ORDER — VANCOMYCIN HCL 750 MG/150ML IV SOLN: 750.0000 mg | INTRAVENOUS | Status: DC

## 2024-02-22 MED ORDER — VANCOMYCIN HCL 1250 MG/250ML IV SOLN
1250.0000 mg | Freq: Once | INTRAVENOUS | Status: AC
Start: 2024-02-22 — End: 2024-02-23
  Administered 2024-02-22: 1250 mg via INTRAVENOUS
  Filled 2024-02-22: qty 250

## 2024-02-22 MED ORDER — SODIUM CHLORIDE 0.9 % IV SOLN
1.0000 g | Freq: Every day | INTRAVENOUS | Status: DC
  Filled 2024-02-22: qty 5

## 2024-02-22 MED ORDER — SODIUM CHLORIDE 0.9 % IV SOLN
1.0000 g | Freq: Every day | INTRAVENOUS | Status: DC
  Administered 2024-02-23 – 2024-02-24 (×2): 1 g via INTRAVENOUS
  Filled 2024-02-22 (×3): qty 5

## 2024-02-22 MED ORDER — DEXAMETHASONE SOD PHOSPHATE PF 10 MG/ML IJ SOLN: 8.0000 mg | INTRAMUSCULAR | Status: DC

## 2024-02-22 MED ORDER — METRONIDAZOLE 500 MG PO TABS
500.0000 mg | ORAL_TABLET | Freq: Two times a day (BID) | ORAL | Status: DC
Start: 2024-02-22 — End: 2024-02-25
  Administered 2024-02-22 – 2024-02-24 (×5): 500 mg via ORAL
  Filled 2024-02-22 (×5): qty 1

## 2024-02-22 NOTE — Progress Notes (Signed)
 Pharmacy Antibiotic Note  Ashley Freeman is a 33 y.o. female admitted on 02/20/2024 with neck abscess.  Pharmacy has been consulted for Aztreonam/Flagyl /Vancomycin  dosing. Of note, patient is on HD TTS  Plan: Vancomycin  1250mg  Loading Dose now Vancomycin  750mg  after HD on TTS, will get one dose this evening after HD Aztreonam 1g q24 (will give 500mg  now and 500mg  after HD as it is 30-60% dialyzable) Flagyl  500mg  PO every 12 hours  Height: 5' 3 (160 cm) Weight: 77.1 kg (170 lb) IBW/kg (Calculated) : 52.4  Temp (24hrs), Avg:98.3 F (36.8 C), Min:97.8 F (36.6 C), Max:99.1 F (37.3 C)  Recent Labs  Lab 02/18/24 0901 02/20/24 0650 02/20/24 0720 02/20/24 1009 02/20/24 1455 02/20/24 1850 02/20/24 2237 02/21/24 0349 02/21/24 0743 02/22/24 0452  WBC 15.2* 15.7*  --   --   --   --   --  13.8*  --  12.7*  CREATININE 10.45* 11.87*  --   --    < > 5.74* 5.74* 5.80* 5.91* 6.88*  LATICACIDVEN  --   --  2.2* 2.3*  --   --   --   --   --   --    < > = values in this interval not displayed.    Estimated Creatinine Clearance: 11.7 mL/min (A) (by C-G formula based on SCr of 6.88 mg/dL (H)).    Allergies  Allergen Reactions   Keflex  [Cephalexin ] Anaphylaxis    Ceftriaxone  in the past with no reaction   Penicillins Anaphylaxis, Hives and Rash   Vibramycin  [Doxycycline ] Anaphylaxis   Benadryl  [Diphenhydramine ] Itching   Dilaudid  [Hydromorphone ] Itching   Methotrexate And Trimetrexate Rash   Roxicodone  [Oxycodone ] Itching    Takes Percocet without issue     Thank you for allowing pharmacy to be a part of this patient's care.  Dionicia Canavan, PharmD, RPh PGY1 Acute Care Pharmacy Resident [facility]  02/22/2024 11:30 AM

## 2024-02-22 NOTE — Progress Notes (Signed)
 Patient ID: Ashley Freeman, female   DOB: Sep 19, 1990, 33 y.o.   MRN: [id number]  [facility] Progress Note   Assessment/ Plan:   1.  Diabetic ketoacidosis: Appears to be from a combination of having missed her insulin  and what appears to be a right neck/submandibular infection.  Glycemic control improving on insulin  drip and antibiotics/corticosteroid to be started today. 2. ESRD: Poorly adherent to outpatient dialysis and will continue TTS schedule while here in the hospital with hemodialysis again today. 3. Anemia: Low/stable hemoglobin and hematocrit overnight without overt blood loss.  Will continue to monitor to determine need to restart ESA versus PRBC transfusion. 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  Await phosphorus level with subsequent labs. 5. Nutrition: Resume renal/carb modified diet.  No evidence of dysphagia but has some odynophagia. 6. Hypertension: Blood pressure elevated, likely exacerbated by ongoing pain.  Monitor with ultrafiltration at hemodialysis.  Subjective:   With worsening right-sided neck swelling/tenderness overnight for which she had a CT scan that showed phlegmonous soft tissue infection in the right posterior neck muscles without organized/drainable fluid evident.   Objective:   BP (!) 167/59 (BP Location: Left Leg)   Pulse (!) 102   Temp 99.1 F (37.3 C) (Oral)   Resp 18   Ht 5' 3 (1.6 m)   Wt 77.1 kg   SpO2 (P) 97%   BMI 30.11 kg/m   Physical Exam: Gen: Appears uncomfortable resting in bed CVS: Pulse regular tachycardia, S1 and S2 with ejection systolic murmur Resp: Clear to auscultation bilaterally without rhonchi/stridor Abd: Soft, obese, nontender, bowel sounds normal Ext: Trace bilateral lower extremity edema.  Right upper arm AV graft with palpable thrill  Labs: BMET Recent Labs  Lab 02/20/24 0650 02/20/24 1455 02/20/24 1850 02/20/24 2237 02/21/24 0349 02/21/24 0743 02/22/24 0452  NA 128* 134* 134*  135 134* 135 132*  K 7.3* 5.5* 3.9 4.1 4.6 4.3 5.0  CL 91* 93* 95* 96* 96* 96* 96*  CO2 <7* 9* 21* 22 19* 20* 19*  GLUCOSE 873* 687* 215* 161* 179* 119* 195*  BUN 107* 111* 45* 44* 44* 46* 52*  CREATININE 11.87* 12.51* 5.74* 5.74* 5.80* 5.91* 6.88*  CALCIUM  8.6* 8.5* 8.6* 8.3* 8.3* 8.6* 8.4*   CBC Recent Labs  Lab 02/18/24 0901 02/20/24 0650 02/21/24 0349 02/22/24 0452  WBC 15.2* 15.7* 13.8* 12.7*  NEUTROABS 11.0* 12.3*  --  8.2*  HGB 9.8* 8.8* 9.0* 9.2*  HCT 32.1* 30.6* 28.2* 29.0*  MCV 89.4 95.6 84.4 84.8  PLT 390 333 265 328    Medications:     bumetanide   10 mg Oral Daily   calcitRIOL   1.25 mcg Oral Q M,W,F   carvedilol   25 mg Oral BID WC   Chlorhexidine  Gluconate Cloth  6 each Topical Q0600   dexamethasone (DECADRON) injection  8 mg Intravenous Q24H   DULoxetine   20 mg Oral Daily   fluticasone   2 spray Each Nare Daily   heparin   5,000 Units Subcutaneous Q8H   lamoTRIgine   200 mg Oral Daily   OLANZapine  zydis  5 mg Oral QHS   rosuvastatin   40 mg Oral Daily   sevelamer  carbonate  800 mg Oral TID WC   Gordy Blanch, MD 02/22/2024, 10:30 AM

## 2024-02-22 NOTE — Plan of Care (Signed)

## 2024-02-22 NOTE — Progress Notes (Signed)
 Midline order placed by Dr. Dennise. Per primary RN nephrology ok'd midline. Pt has current IV orders for vancomycin  and dextrose  10%. Informed primary team midline not indicated at this time due to vesicant meds ordered and being received. Pt has two working PIVs at this time and the antibiotics are compatible so she does not need any other access at this time. Recommended central line if either PIV is lost.

## 2024-02-22 NOTE — Progress Notes (Signed)
 Progress Note   Patient: Ashley Freeman FMW:[id number] DOB: May 24, 1990 DOA: 02/20/2024     2 DOS: the patient was seen and examined on 02/22/2024   Brief hospital course: Partly taken from prior notes.   Ashley Freeman is a 33 y.o. female with medical history significant of ESRD HD TTS, HFrEF (EF 30-35% in 08/2023), DM1, bipolar disorder, and frequent admission for DKA/volume overload p/w hyperglycemia 2/2 DKA and SOB iso volume overload 2/2 ADHF and ESRD.   Multiple prior hospitalization for similar reason.  In the ED, pt tachypneic on RA. Labs notable for VBH showing pH/CO2 of 6.96/18, Na 128, K 7.3, glucose 873, Cr 11.87, alkaline phosphatase 550, AST 47, ALT 59, T. Bili 2.4, lactate 2.2-->2.3, beta-hydroxybuterate >8, and WBC 15.7. CXR w/ volume overload, and EKG NSR.   PCCM was also consulted.  10/25: Vital Stable, improving CBG, gap still open at 19, continuing insulin  gtt. had HD last night.  Assessment and Plan: DKA (diabetic ketoacidosis) (HCC) in a patient with DM type I due to noncompliance and now appears to have a neck abscess. Secondary to not using insulin  for the past 2 days, multiple ED visits and hospitalizations for the similar reason.  History of chronic noncompliant.  Still in DKA continue insulin  drip, DKA likely worse due to neck abscess.  Monitor  Right sided soft tissue neck abscess/phlegmon.  Became symptomatic over the last 24 hours, blood cultures, MRSA nasal PCR, empiric IV antibiotics, discussed with ENT, IV Decadron, ENT to see, will require I&D eventually.  ESRD with hyperkalemia.  Nephrology on board on HD sessions, noncompliant.  TTS schedule.  HFrEF (heart failure with reduced ejection fraction) (HCC) History of nonischemic cardiomyopathy with EF of 30 to 35% on echo done in May 2025.  On admission significantly volume overload with missed dialysis. Volume is being managed with dialysis. Currently euvolemic  Bipolar disorder  (HCC) - Continue home olanzapine , Lamictal  and Cymbalta  -Follow-up Lamictal  levels-still pending   Subjective: Patient in bed, in excruciating pain due to right neck discomfort, no nausea vomiting or chest pain  Physical Exam: Vitals:   02/21/24 1400 02/21/24 2040 02/22/24 0103 02/22/24 0348  BP: (!) 143/71 (!) 149/73 (!) 167/59   Pulse: 96 99 (!) 102   Resp: 18     Temp: 97.8 F (36.6 C) 98 F (36.7 C) 98.4 F (36.9 C) 99.1 F (37.3 C)  TempSrc: Axillary Oral Axillary Oral  SpO2: 96%   (P) 97%  Weight:      Height:       Awake Alert, No new F.N deficits, Normal affect [place].AT,PERRAL Right neck swelling and palpable abscess Symmetrical Chest wall movement, Good air movement bilaterally, CTAB RRR,No Gallops, Rubs or new Murmurs,  +ve B.Sounds, Abd Soft, No tenderness,   No Cyanosis, Clubbing or edema    Data Review:   Patient Lines/Drains/Airways Status     Active Line/Drains/Airways     Name Placement date Placement time Site Days   Peripheral IV 02/20/24 20 G 1.88 Left Antecubital 02/20/24  0846  Antecubital  2   Peripheral IV 02/20/24 22 G 1.75 Left;Anterior Forearm 02/20/24  0831  Forearm  2   Peripheral IV 02/21/24 22 G 2.5 Anterior;Left;Upper Arm 02/21/24  1340  Arm  1   Fistula / Graft Right Upper arm Arteriovenous vein graft 07/06/22  1013  Upper arm  596             Inpatient Medications  Scheduled Meds:  bumetanide   10 mg  Oral Daily   calcitRIOL   1.25 mcg Oral Q M,W,F   carvedilol   25 mg Oral BID WC   Chlorhexidine  Gluconate Cloth  6 each Topical Q0600   dexamethasone (DECADRON) injection  8 mg Intravenous Q24H   DULoxetine   20 mg Oral Daily   fluticasone   2 spray Each Nare Daily   heparin   5,000 Units Subcutaneous Q8H   lamoTRIgine   200 mg Oral Daily   OLANZapine  zydis  5 mg Oral QHS   rosuvastatin   40 mg Oral Daily   sevelamer  carbonate  800 mg Oral TID WC   Continuous Infusions:  dextrose  10 % and 0.45 % NaCl 40 mL/hr at 02/20/24 1952    insulin  0.6 Units/hr (02/22/24 0947)   PRN Meds:.dextrose , hydrOXYzine , ipratropium-albuterol , naLOXone  (NARCAN )  injection  DVT Prophylaxis  heparin  injection 5,000 Units Start: 02/20/24 1400 SCDs Start: 02/20/24 1037   Recent Labs  Lab 02/18/24 0901 02/20/24 0650 02/21/24 0349 02/22/24 0452  WBC 15.2* 15.7* 13.8* 12.7*  HGB 9.8* 8.8* 9.0* 9.2*  HCT 32.1* 30.6* 28.2* 29.0*  PLT 390 333 265 328  MCV 89.4 95.6 84.4 84.8  MCH 27.3 27.5 26.9 26.9  MCHC 30.5 28.8* 31.9 31.7  RDW 15.5 15.9* 15.5 15.8*  LYMPHSABS 2.5 2.3  --  3.2  MONOABS 0.8 0.9  --  0.7  EOSABS 0.7* 0.0  --  0.4  BASOSABS 0.1 0.1  --  0.1    Recent Labs  Lab 02/18/24 0901 02/18/24 0903 02/20/24 0650 02/20/24 0720 02/20/24 1009 02/20/24 1455 02/20/24 1850 02/20/24 2237 02/21/24 0349 02/21/24 0743 02/22/24 0452  NA 136  --  128*  --   --    < > 134* 135 134* 135 132*  K 5.1  --  7.3*  --   --    < > 3.9 4.1 4.6 4.3 5.0  CL 102  --  91*  --   --    < > 95* 96* 96* 96* 96*  CO2 19*  --  <7*  --   --    < > 21* 22 19* 20* 19*  ANIONGAP 15  --  NOT CALCULATED  --   --    < > 18* 17* 19* 19* 17*  GLUCOSE 165*  --  873*  --   --    < > 215* 161* 179* 119* 195*  BUN 86*  --  107*  --   --    < > 45* 44* 44* 46* 52*  CREATININE 10.45*  --  11.87*  --   --    < > 5.74* 5.74* 5.80* 5.91* 6.88*  AST 30  --  47*  --   --   --   --   --   --   --   --   ALT 56*  --  59*  --   --   --   --   --   --   --   --   ALKPHOS 559*  --  550*  --   --   --   --   --   --   --   --   BILITOT 0.9  --  2.4*  --   --   --   --   --   --   --   --   ALBUMIN  2.7*  --  3.1*  --   --   --   --   --   --   --   --  LATICACIDVEN  --   --   --  2.2* 2.3*  --   --   --   --   --   --   MG  --  3.2*  --   --   --   --   --   --   --   --   --   CALCIUM  8.3*  --  8.6*  --   --    < > 8.6* 8.3* 8.3* 8.6* 8.4*   < > = values in this interval not displayed.      Recent Labs  Lab 02/18/24 0903 02/20/24 0650 02/20/24 0720  02/20/24 1009 02/20/24 1455 02/20/24 1850 02/20/24 2237 02/21/24 0349 02/21/24 0743 02/22/24 0452  LATICACIDVEN  --   --  2.2* 2.3*  --   --   --   --   --   --   MG 3.2*  --   --   --   --   --   --   --   --   --   CALCIUM   --    < >  --   --    < > 8.6* 8.3* 8.3* 8.6* 8.4*   < > = values in this interval not displayed.    --------------------------------------------------------------------------------------------------------------- Lab Results  Component Value Date   CHOL 235 (H) 05/24/2021   HDL 54.70 05/24/2021   LDLCALC 149 (H) 05/24/2021   TRIG 157.0 (H) 05/24/2021   CHOLHDL 4 05/24/2021    Lab Results  Component Value Date   HGBA1C 10.8 (H) 12/20/2023    Micro Results Recent Results (from the past 240 hours)  Resp panel by RT-PCR (RSV, Flu A&B, Covid) Anterior Nasal Swab     Status: None   Collection Time: 02/18/24  7:41 AM   Specimen: Anterior Nasal Swab  Result Value Ref Range Status   SARS Coronavirus 2 by RT PCR NEGATIVE NEGATIVE Final   Influenza A by PCR NEGATIVE NEGATIVE Final   Influenza B by PCR NEGATIVE NEGATIVE Final    Comment: (NOTE) The Xpert Xpress SARS-CoV-2/FLU/RSV plus assay is intended as an aid in the diagnosis of influenza from Nasopharyngeal swab specimens and should not be used as a sole basis for treatment. Nasal washings and aspirates are unacceptable for Xpert Xpress SARS-CoV-2/FLU/RSV testing.  Fact Sheet for Patients: bloggercourse.com  Fact Sheet for Healthcare Providers: seriousbroker.it  This test is not yet approved or cleared by the [place]  FDA and has been authorized for detection and/or diagnosis of SARS-CoV-2 by FDA under an Emergency Use Authorization (EUA). This EUA will remain in effect (meaning this test can be used) for the duration of the COVID-19 declaration under Section 564(b)(1) of the Act, 21 U.S.C. section 360bbb-3(b)(1), unless the authorization is  terminated or revoked.     Resp Syncytial Virus by PCR NEGATIVE NEGATIVE Final    Comment: (NOTE) Fact Sheet for Patients: bloggercourse.com  Fact Sheet for Healthcare Providers: seriousbroker.it  This test is not yet approved or cleared by the [place]  FDA and has been authorized for detection and/or diagnosis of SARS-CoV-2 by FDA under an Emergency Use Authorization (EUA). This EUA will remain in effect (meaning this test can be used) for the duration of the COVID-19 declaration under Section 564(b)(1) of the Act, 21 U.S.C. section 360bbb-3(b)(1), unless the authorization is terminated or revoked.  Performed at [facility], [address] [place], [place] [address]     Radiology Reports  CT SOFT  TISSUE NECK WO CONTRAST Result Date: 02/22/2024 EXAM: CT NECK WITHOUT CONTRAST 02/22/2024 08:31:39 AM TECHNIQUE: CT of the neck was performed without the administration of intravenous contrast. Multiplanar reformatted images are provided for review. Automated exposure control, iterative reconstruction, and/or weight based adjustment of the mA/kV was utilized to reduce the radiation dose to as low as reasonably achievable. COMPARISON: Chest radiograph 02/22/2024. CLINICAL HISTORY: 33 year old female. Soft tissue infection suspected, neck, xray done. End stage renal disease. FINDINGS: AERODIGESTIVE TRACT: Retropharyngeal or prevertebral space edema is mild (series 3, image 62). Laryngeal and pharyngeal soft tissue contours are within normal limits. No discrete mass. SALIVARY GLANDS: The parotid and submandibular glands are unremarkable. Negative noncontrast sublingual space. THYROID : Unremarkable. LYMPH NODES: Surrounding the phlegmon-like soft tissue mass in the right posterior neck musculature, asymmetric level 5, level 2b, and level 3b lymph nodes measure up to 12 mm short axis individually (series 3, images 48 through 56). Enlarged  and reactive appearing right level 4 and supraclavicular lymph nodes also measure up to 14 mm short axis (series 3, image 85). SOFT TISSUES: Abnormal indistinct phlegmon-like soft tissue mass and spiculation right posterior neck musculature, trapezius, best seen on sagittal image 45. This is in an area of about 2.3 cm. No soft tissue gas. BRAIN, ORBITS, SINUSES AND MASTOIDS: Age advanced calcified atherosclerosis at the bilateral skull base. Mild to moderate bilateral paranasal sinus mucosal thickening. Trace right mastoid effusion. Tympanic cavities well aerated. No acute dental finding. LUNGS AND MEDIASTINUM: Layering right pleural effusion in the lung apex. Mild respiratory motion and atelectatic changes in the visible upper lungs and airways. Tortuous proximal great vessels. BONES: No osseous abnormality identified. VASCULATURE: Vascular patency not evaluated in the absence of IV contrast. Age advanced bilateral carotid and calcified atherosclerosis in the neck. Age advanced calcified atherosclerosis at the bilateral skull base. IMPRESSION: 1. non-contrast CT appearance strongly suggestive of Phlegmonous soft tissue infection in the right posterior neck musculature (trapezius) measuring approximately 2.3 cm. No organized or drainable fluid collection is evident. Correlation with focused Ultrasound there may be valuable. 2. Mild retropharyngeal or prevertebral space edema, and regional lymphadenopathy which tracks inferiorly to the right supraclavicular fossa. 3. Mild to moderate bilateral paranasal sinus inflammation. Mild right mastoid effusion. 4. Layering right pleural effusion in the apex. Electronically signed by: Helayne Hurst MD 02/22/2024 08:49 AM EDT RP Workstation: [id number]   DG Shoulder 1V Right Result Date: 02/22/2024 EXAM: 1 VIEW XRAY OF THE RIGHT SHOULDER 02/22/2024 06:20:00 AM COMPARISON: Chest radiograph today. CLINICAL HISTORY: FINDINGS: BONES AND JOINTS: Glenohumeral joint is normally  aligned. No acute fracture or dislocation. The [facility] joint is unremarkable in appearance. SOFT TISSUES: Vascular stent in right upper extremity. No abnormal calcifications. Visualized lung is unremarkable. IMPRESSION: 1. No osseous abnormality of the right shoulder. 2. Right axillary vascular stent in place. Electronically signed by: Helayne Hurst MD 02/22/2024 06:41 AM EDT RP Workstation: [id number]   DG Chest Port 1 View Result Date: 02/22/2024 EXAM: 1 VIEW(S) XRAY OF THE CHEST 02/22/2024 06:20:00 AM COMPARISON: 02/20/2024 CLINICAL HISTORY: SOB (shortness of breath) [id number]. Reason for exam: shortness of breath; CHIEF COMPLAINTS: Hyperglycemia ; Principal Problem: ; DKA (diabetic ketoacidosis) (HCC) FINDINGS: LINES, TUBES AND DEVICES: Multiple overlying monitor wires noted. LUNGS AND PLEURA: Hypoinflated lungs. Bilateral interstitial prominence. Veiling right pleural effusion less apparent. No focal pulmonary opacity. No overt pulmonary edema. No pneumothorax. HEART AND MEDIASTINUM: Mild cardiomegaly with central vascular prominence. BONES AND SOFT TISSUES: No acute osseous abnormality. IMPRESSION: 1. Hypoinflated lungs with interstitial prominence.  Right pleural effusion less apparent. 2. No new cardiopulmonary abnormality. Electronically signed by: Helayne Hurst MD 02/22/2024 06:40 AM EDT RP Workstation: [id number]      Signature  -   Lavada Stank M.D on 02/22/2024 at 9:58 AM   -  To page go to [email or web address]

## 2024-02-22 NOTE — Progress Notes (Signed)
 Overnight floor coverage  Patient complained of right-sided shoulder pain radiating to her chest.  RN noted new right-sided neck swelling and tenderness on exam.  However, no shortness of breath or difficulty swallowing reported.  Vital signs stable.  -EKG without STEMI.  Troponin pending. -Stat CT soft tissue neck without contrast ordered

## 2024-02-22 NOTE — Plan of Care (Signed)
 ENT Note: Reviewed CT imaging. Read pending but clear abscess right neck node --- given patient preference for OR drainage. Will take to OR to drain -- please make NPO at midnight, time pending but patient is posted for OR tomorrow  Page ENT with questions  Eldora KATHEE Blanch

## 2024-02-22 NOTE — Consult Note (Addendum)
 ENT CONSULT:  Reason for Consult: Right Neck Abscess  Referring Physician: Medicine Team  HPI: IMAGINE NEST is an 33 y.o. female with h/o T1DM and ESRD on new diagnosis, HFrEF, BP Dz admitted for DKA and SOB who ENT was consulted for possible right neck abscess. Patient reports that she had started to have right neck pain starting yesterday. No fevers. No h/o abscesses, denies trauma. Also noted to have some sore throat, but denies trouble breathing or swallowing. Voice without change. Otherwise no fevers. Abx naive. CT Neck was done but without contrast which is interpreted below. Currently on insulin  drip and dialysis.  WBC ~15  Past Medical History:  Diagnosis Date   Abscess, gluteal, right 08/24/2013   Anemia 02/19/2012   Bartholin's gland abscess 09/19/2013   Bipolar disorder (HCC)    BV (bacterial vaginosis) 11/24/2015   Depression    Diabetes mellitus type I (HCC) 2001   Diagnosed at age 64 ; Type I   Diarrhea 05/30/2016   DKA (diabetic ketoacidoses) 08/19/2013   Also in 2018   ESRD (end stage renal disease) (HCC)    Gonorrhea 08/2011   Treated in 09/2011   HFrEF (heart failure with reduced ejection fraction) (HCC)    a. 2022 Echo: EF 40%; b. 10/2021 Echo: EF 55%; b. 07/2022 MV: No ischemia. EF 31%; c. 08/2022 Echo: EF 35%, mildly dil RV, sev TR.   History of trichomoniasis 05/31/2016   Hyperlipidemia 03/28/2016   Hypertension    NICM (nonischemic cardiomyopathy) (HCC)    Sepsis (HCC) 09/19/2013    Past Surgical History:  Procedure Laterality Date   A/V FISTULAGRAM Right 06/17/2023   Procedure: A/V Fistulagram;  Surgeon: Marea Selinda GORMAN, MD;  Location: [facility] INVASIVE CV LAB;  Service: Cardiovascular;  Laterality: Right;   A/V SHUNT INTERVENTION N/A 09/25/2023   Procedure: A/V SHUNT INTERVENTION;  Surgeon: Pearline Norman GORMAN, MD;  Location: [facility];  Service: Cardiovascular;  Laterality: N/A;   AV FISTULA PLACEMENT Right 07/06/2022   Procedure: ARTERIOVENOUS  GRAFT CREATION;  Surgeon: Gretta Lonni PARAS, MD;  Location: [facility] OR;  Service: Vascular;  Laterality: Right;   CESAREAN SECTION N/A 10/05/2019   Procedure: CESAREAN SECTION;  Surgeon: Izell Harari, MD;  Location: [facility] ORS;  Service: Obstetrics;  Laterality: N/A;   CHOLECYSTECTOMY N/A 07/02/2023   Procedure: LAPAROSCOPIC CHOLECYSTECTOMY;  Surgeon: Ebbie Cough, MD;  Location: [facility] OR;  Service: General;  Laterality: N/A;   ESOPHAGOGASTRODUODENOSCOPY N/A 01/31/2024   Procedure: EGD (ESOPHAGOGASTRODUODENOSCOPY);  Surgeon: San Sandor GAILS, DO;  Location: [facility] ENDOSCOPY;  Service: Gastroenterology;  Laterality: N/A;   INCISION AND DRAINAGE ABSCESS Left 09/28/2019   Procedure: INCISION AND DRAINAGE VULVAR ABCESS;  Surgeon: Edsel Norleen GAILS, MD;  Location: [facility] OR;  Service: Gynecology;  Laterality: Left;   INCISION AND DRAINAGE PERIRECTAL ABSCESS Right 08/18/2013   Procedure: IRRIGATION AND DEBRIDEMENT GLUTEAL ABSCESS;  Surgeon: Lynda Leos, MD;  Location: [facility] OR;  Service: General;  Laterality: Right;   INCISION AND DRAINAGE PERIRECTAL ABSCESS Right 09/19/2013   Procedure: IRRIGATION AND DEBRIDEMENT RIGHT GLUTEAL AND LABIAL ABSCESSES;  Surgeon: Lynda Leos, MD;  Location: [facility] OR;  Service: General;  Laterality: Right;   INCISION AND DRAINAGE PERIRECTAL ABSCESS Right 09/24/2013   Procedure: IRRIGATION AND DEBRIDEMENT PERIRECTAL ABSCESS;  Surgeon: Lynwood MALVA Pina, MD;  Location[facility] OR;  Service: General;  Laterality: Right;   IR PARACENTESIS  08/28/2023   IR PARACENTESIS  11/04/2023   IR PARACENTESIS  12/23/2023   IR PARACENTESIS  02/04/2024  Family History  Problem Relation Age of Onset   Asthma Mother    Carpal tunnel syndrome Mother    Gout Father    Diabetes Paternal Grandmother    Anesthesia problems Neg Hx     Social History:  reports that she has never smoked. She has never been exposed to tobacco smoke. She has never used smokeless tobacco. She reports that she does not  currently use alcohol. She reports that she does not use drugs.  Allergies:  Allergies  Allergen Reactions   Keflex  [Cephalexin ] Anaphylaxis    Ceftriaxone  in the past with no reaction   Penicillins Anaphylaxis, Hives and Rash   Vibramycin  [Doxycycline ] Anaphylaxis   Benadryl  [Diphenhydramine ] Itching   Dilaudid  [Hydromorphone ] Itching   Methotrexate And Trimetrexate Rash   Roxicodone  [Oxycodone ] Itching    Takes Percocet without issue    Medications: I have reviewed the patient's current medications.  Results for orders placed or performed during the hospital encounter of 02/20/24 (from the past 48 hours)  I-Stat Lactic Acid, ED     Status: Abnormal   Collection Time: 02/20/24 10:09 AM  Result Value Ref Range   Lactic Acid, Venous 2.3 (HH) 0.5 - 1.9 mmol/L   Comment NOTIFIED PHYSICIAN   CBG monitoring, ED     Status: Abnormal   Collection Time: 02/20/24 10:19 AM  Result Value Ref Range   Glucose-Capillary >600 (HH) 70 - 99 mg/dL    Comment: Glucose reference range applies only to samples taken after fasting for at least 8 hours.  CBG monitoring, ED     Status: Abnormal   Collection Time: 02/20/24 10:57 AM  Result Value Ref Range   Glucose-Capillary >600 (HH) 70 - 99 mg/dL    Comment: Glucose reference range applies only to samples taken after fasting for at least 8 hours.  Beta-hydroxybutyric acid     Status: Abnormal   Collection Time: 02/20/24 11:11 AM  Result Value Ref Range   Beta-Hydroxybutyric Acid >8.00 (H) 0.05 - 0.27 mmol/L    Comment: RESULT CONFIRMED BY MANUAL DILUTION Performed at [facility] Lab, [address], [place], [place] [address]   Blood gas, venous     Status: Abnormal   Collection Time: 02/20/24 11:11 AM  Result Value Ref Range   pH, Ven 6.96 (LL) 7.25 - 7.43    Comment: CRITICAL RESULT CALLED TO, READ BACK BY AND VERIFIED WITH: ALYSSA PAXTON RN.@1135  ON 10.23.25 BY TCALDWELL MLS.    pCO2, Ven 18 (LL) 44 - 60 mmHg    Comment: CRITICAL  RESULT CALLED TO, READ BACK BY AND VERIFIED WITH: ALYSSA PAXTON RN.@1135  ON 10.23.25 BY TCALDWELL MLS.    pO2, Ven 149 (H) 32 - 45 mmHg   Bicarbonate 4.0 (L) 20.0 - 28.0 mmol/L   Acid-base deficit 26.5 (H) 0.0 - 2.0 mmol/L   O2 Saturation 96.1 %   Patient temperature 37.0     Comment: Performed at [facility], [address] [place], [place] [address]  CBG monitoring, ED     Status: Abnormal   Collection Time: 02/20/24 11:37 AM  Result Value Ref Range   Glucose-Capillary >600 (HH) 70 - 99 mg/dL    Comment: Glucose reference range applies only to samples taken after fasting for at least 8 hours.  CBG monitoring, ED     Status: Abnormal   Collection Time: 02/20/24 12:31 PM  Result Value Ref Range   Glucose-Capillary >600 (HH) 70 - 99 mg/dL    Comment: Glucose reference  range applies only to samples taken after fasting for at least 8 hours.  CBG monitoring, ED     Status: Abnormal   Collection Time: 02/20/24  1:09 PM  Result Value Ref Range   Glucose-Capillary >600 (HH) 70 - 99 mg/dL    Comment: Glucose reference range applies only to samples taken after fasting for at least 8 hours.  CBG monitoring, ED     Status: Abnormal   Collection Time: 02/20/24  1:42 PM  Result Value Ref Range   Glucose-Capillary >600 (HH) 70 - 99 mg/dL    Comment: Glucose reference range applies only to samples taken after fasting for at least 8 hours.  CBG monitoring, ED     Status: Abnormal   Collection Time: 02/20/24  2:21 PM  Result Value Ref Range   Glucose-Capillary >600 (HH) 70 - 99 mg/dL    Comment: Glucose reference range applies only to samples taken after fasting for at least 8 hours.  Beta-hydroxybutyric acid     Status: Abnormal   Collection Time: 02/20/24  2:50 PM  Result Value Ref Range   Beta-Hydroxybutyric Acid 7.10 (H) 0.05 - 0.27 mmol/L    Comment: RESULT CONFIRMED BY MANUAL DILUTION Performed at [facility], [address], [place], [place] [address]   Basic metabolic  panel with GFR     Status: Abnormal   Collection Time: 02/20/24  2:55 PM  Result Value Ref Range   Sodium 134 (L) 135 - 145 mmol/L   Potassium 5.5 (H) 3.5 - 5.1 mmol/L   Chloride 93 (L) 98 - 111 mmol/L   CO2 9 (L) 22 - 32 mmol/L   Glucose, Bld 687 (HH) 70 - 99 mg/dL    Comment: CRITICAL RESULT CALLED TO, READ BACK BY AND VERIFIED WITH CSABRA POUCH, RN AT [phone] 10.23.25 D. BLU Glucose reference range applies only to samples taken after fasting for at least 8 hours.    BUN 111 (H) 6 - 20 mg/dL   Creatinine, Ser 87.48 (H) 0.44 - 1.00 mg/dL   Calcium  8.5 (L) 8.9 - 10.3 mg/dL   GFR, Estimated 4 (L) >60 mL/min    Comment: (NOTE) Calculated using the CKD-EPI Creatinine Equation (2021)    Anion gap 32 (H) 5 - 15    Comment: ELECTROLYTES REPEATED TO VERIFY Performed at [facility] Lab, [address] [place], [place] [address]   CBG monitoring, ED     Status: Abnormal   Collection Time: 02/20/24  3:03 PM  Result Value Ref Range   Glucose-Capillary >600 (HH) 70 - 99 mg/dL    Comment: Glucose reference range applies only to samples taken after fasting for at least 8 hours.  CBG monitoring, ED     Status: Abnormal   Collection Time: 02/20/24  3:45 PM  Result Value Ref Range   Glucose-Capillary >600 (HH) 70 - 99 mg/dL    Comment: Glucose reference range applies only to samples taken after fasting for at least 8 hours.  Blood gas, venous     Status: Abnormal   Collection Time: 02/20/24  3:51 PM  Result Value Ref Range   pH, Ven 7.15 (LL) 7.25 - 7.43    Comment: CRITICAL RESULT CALLED TO, READ BACK BY AND VERIFIED WITH: NOTIFIED CAT WILLIAMS, RN @ 1606 02/20/24 BY SEKDAHL    pCO2, Ven 25 (L) 44 - 60 mmHg   pO2, Ven 68 (H) 32 - 45 mmHg   Bicarbonate 8.7 (L) 20.0 - 28.0 mmol/L  Acid-base deficit 18.5 (H) 0.0 - 2.0 mmol/L   O2 Saturation 95.5 %   Patient temperature 37.0    Collection site LFA     Comment: Performed at [facility], [address], [place], [place] [address]   CBG monitoring, ED     Status: Abnormal   Collection Time: 02/20/24  4:45 PM  Result Value Ref Range   Glucose-Capillary 511 (HH) 70 - 99 mg/dL    Comment: Glucose reference range applies only to samples taken after fasting for at least 8 hours.   Comment 1 Notify RN   CBG monitoring, ED     Status: Abnormal   Collection Time: 02/20/24  5:17 PM  Result Value Ref Range   Glucose-Capillary 406 (H) 70 - 99 mg/dL    Comment: Glucose reference range applies only to samples taken after fasting for at least 8 hours.  CBG monitoring, ED     Status: Abnormal   Collection Time: 02/20/24  5:40 PM  Result Value Ref Range   Glucose-Capillary 378 (H) 70 - 99 mg/dL    Comment: Glucose reference range applies only to samples taken after fasting for at least 8 hours.  CBG monitoring, ED     Status: Abnormal   Collection Time: 02/20/24  6:38 PM  Result Value Ref Range   Glucose-Capillary 261 (H) 70 - 99 mg/dL    Comment: Glucose reference range applies only to samples taken after fasting for at least 8 hours.  Beta-hydroxybutyric acid     Status: Abnormal   Collection Time: 02/20/24  6:50 PM  Result Value Ref Range   Beta-Hydroxybutyric Acid 1.25 (H) 0.05 - 0.27 mmol/L    Comment: Performed at [facility], [address], [place], [place] [address]  Basic metabolic panel     Status: Abnormal   Collection Time: 02/20/24  6:50 PM  Result Value Ref Range   Sodium 134 (L) 135 - 145 mmol/L   Potassium 3.9 3.5 - 5.1 mmol/L   Chloride 95 (L) 98 - 111 mmol/L   CO2 21 (L) 22 - 32 mmol/L   Glucose, Bld 215 (H) 70 - 99 mg/dL    Comment: Glucose reference range applies only to samples taken after fasting for at least 8 hours.   BUN 45 (H) 6 - 20 mg/dL   Creatinine, Ser 4.25 (H) 0.44 - 1.00 mg/dL    Comment: DIALYSIS   Calcium  8.6 (L) 8.9 - 10.3 mg/dL   GFR, Estimated 9 (L) >60 mL/min    Comment: (NOTE) Calculated using the CKD-EPI Creatinine Equation (2021)    Anion gap 18 (H) 5 - 15     Comment: Performed at [facility], [address] [place], [place] [address]  CBG monitoring, ED     Status: Abnormal   Collection Time: 02/20/24  7:48 PM  Result Value Ref Range   Glucose-Capillary 188 (H) 70 - 99 mg/dL    Comment: Glucose reference range applies only to samples taken after fasting for at least 8 hours.  CBG monitoring, ED     Status: Abnormal   Collection Time: 02/20/24  8:57 PM  Result Value Ref Range   Glucose-Capillary 172 (H) 70 - 99 mg/dL    Comment: Glucose reference range applies only to samples taken after fasting for at least 8 hours.  CBG monitoring, ED     Status: Abnormal   Collection Time: 02/20/24  9:59 PM  Result Value Ref Range   Glucose-Capillary  169 (H) 70 - 99 mg/dL    Comment: Glucose reference range applies only to samples taken after fasting for at least 8 hours.  Beta-hydroxybutyric acid     Status: Abnormal   Collection Time: 02/20/24 10:37 PM  Result Value Ref Range   Beta-Hydroxybutyric Acid 0.38 (H) 0.05 - 0.27 mmol/L    Comment: Performed at [facility], [address], [place], [place] [address]  Basic metabolic panel     Status: Abnormal   Collection Time: 02/20/24 10:37 PM  Result Value Ref Range   Sodium 135 135 - 145 mmol/L   Potassium 4.1 3.5 - 5.1 mmol/L   Chloride 96 (L) 98 - 111 mmol/L   CO2 22 22 - 32 mmol/L   Glucose, Bld 161 (H) 70 - 99 mg/dL    Comment: Glucose reference range applies only to samples taken after fasting for at least 8 hours.   BUN 44 (H) 6 - 20 mg/dL   Creatinine, Ser 4.25 (H) 0.44 - 1.00 mg/dL    Comment: DIALYSIS   Calcium  8.3 (L) 8.9 - 10.3 mg/dL   GFR, Estimated 9 (L) >60 mL/min    Comment: (NOTE) Calculated using the CKD-EPI Creatinine Equation (2021)    Anion gap 17 (H) 5 - 15    Comment: Performed at [facility], [address] [place], [place] [address]  CBG monitoring, ED     Status: Abnormal   Collection Time: 02/20/24 11:14 PM  Result Value Ref Range    Glucose-Capillary 131 (H) 70 - 99 mg/dL    Comment: Glucose reference range applies only to samples taken after fasting for at least 8 hours.  CBG monitoring, ED     Status: Abnormal   Collection Time: 02/21/24 12:06 AM  Result Value Ref Range   Glucose-Capillary 125 (H) 70 - 99 mg/dL    Comment: Glucose reference range applies only to samples taken after fasting for at least 8 hours.  Glucose, capillary     Status: Abnormal   Collection Time: 02/21/24  2:01 AM  Result Value Ref Range   Glucose-Capillary 132 (H) 70 - 99 mg/dL    Comment: Glucose reference range applies only to samples taken after fasting for at least 8 hours.  Basic metabolic panel     Status: Abnormal   Collection Time: 02/21/24  3:49 AM  Result Value Ref Range   Sodium 134 (L) 135 - 145 mmol/L   Potassium 4.6 3.5 - 5.1 mmol/L   Chloride 96 (L) 98 - 111 mmol/L   CO2 19 (L) 22 - 32 mmol/L   Glucose, Bld 179 (H) 70 - 99 mg/dL    Comment: Glucose reference range applies only to samples taken after fasting for at least 8 hours.   BUN 44 (H) 6 - 20 mg/dL   Creatinine, Ser 4.19 (H) 0.44 - 1.00 mg/dL   Calcium  8.3 (L) 8.9 - 10.3 mg/dL   GFR, Estimated 9 (L) >60 mL/min    Comment: (NOTE) Calculated using the CKD-EPI Creatinine Equation (2021)    Anion gap 19 (H) 5 - 15    Comment: Performed at [facility], [address], [place], [place] [address]  CBC     Status: Abnormal   Collection Time: 02/21/24  3:49 AM  Result Value Ref Range   WBC 13.8 (H) 4.0 - 10.5 K/uL   RBC 3.34 (L) 3.87 - 5.11 MIL/uL   Hemoglobin 9.0 (L) 12.0 - 15.0 g/dL  HCT 28.2 (L) 36.0 - 46.0 %   MCV 84.4 80.0 - 100.0 fL    Comment: DELTA CHECK NOTED   MCH 26.9 26.0 - 34.0 pg   MCHC 31.9 30.0 - 36.0 g/dL   RDW 84.4 88.4 - 84.4 %   Platelets 265 150 - 400 K/uL   nRBC 0.0 0.0 - 0.2 %    Comment: Performed at [facility], [address], [place], [place] [address]  Glucose, capillary     Status: Abnormal   Collection Time: 02/21/24   3:59 AM  Result Value Ref Range   Glucose-Capillary 192 (H) 70 - 99 mg/dL    Comment: Glucose reference range applies only to samples taken after fasting for at least 8 hours.  Glucose, capillary     Status: Abnormal   Collection Time: 02/21/24  5:07 AM  Result Value Ref Range   Glucose-Capillary 200 (H) 70 - 99 mg/dL    Comment: Glucose reference range applies only to samples taken after fasting for at least 8 hours.  Glucose, capillary     Status: Abnormal   Collection Time: 02/21/24  6:10 AM  Result Value Ref Range   Glucose-Capillary 166 (H) 70 - 99 mg/dL    Comment: Glucose reference range applies only to samples taken after fasting for at least 8 hours.  Glucose, capillary     Status: Abnormal   Collection Time: 02/21/24  7:18 AM  Result Value Ref Range   Glucose-Capillary 125 (H) 70 - 99 mg/dL    Comment: Glucose reference range applies only to samples taken after fasting for at least 8 hours.  Basic metabolic panel     Status: Abnormal   Collection Time: 02/21/24  7:43 AM  Result Value Ref Range   Sodium 135 135 - 145 mmol/L   Potassium 4.3 3.5 - 5.1 mmol/L   Chloride 96 (L) 98 - 111 mmol/L   CO2 20 (L) 22 - 32 mmol/L   Glucose, Bld 119 (H) 70 - 99 mg/dL    Comment: Glucose reference range applies only to samples taken after fasting for at least 8 hours.   BUN 46 (H) 6 - 20 mg/dL   Creatinine, Ser 4.08 (H) 0.44 - 1.00 mg/dL   Calcium  8.6 (L) 8.9 - 10.3 mg/dL   GFR, Estimated 9 (L) >60 mL/min    Comment: (NOTE) Calculated using the CKD-EPI Creatinine Equation (2021)    Anion gap 19 (H) 5 - 15    Comment: Performed at [facility], [address], [place], [place] [address]  Glucose, capillary     Status: Abnormal   Collection Time: 02/21/24  8:18 AM  Result Value Ref Range   Glucose-Capillary 129 (H) 70 - 99 mg/dL    Comment: Glucose reference range applies only to samples taken after fasting for at least 8 hours.  Glucose, capillary     Status: Abnormal    Collection Time: 02/21/24 10:30 AM  Result Value Ref Range   Glucose-Capillary 108 (H) 70 - 99 mg/dL    Comment: Glucose reference range applies only to samples taken after fasting for at least 8 hours.  Glucose, capillary     Status: Abnormal   Collection Time: 02/21/24 11:41 AM  Result Value Ref Range   Glucose-Capillary 144 (H) 70 - 99 mg/dL    Comment: Glucose reference range applies only to samples taken after fasting for at least 8 hours.  Glucose, capillary     Status: Abnormal  Collection Time: 02/21/24 12:45 PM  Result Value Ref Range   Glucose-Capillary 178 (H) 70 - 99 mg/dL    Comment: Glucose reference range applies only to samples taken after fasting for at least 8 hours.  Beta-hydroxybutyric acid     Status: Abnormal   Collection Time: 02/21/24  1:14 PM  Result Value Ref Range   Beta-Hydroxybutyric Acid 1.82 (H) 0.05 - 0.27 mmol/L    Comment: Performed at [facility], [address] [place], [place] [address]  Glucose, capillary     Status: Abnormal   Collection Time: 02/21/24  1:40 PM  Result Value Ref Range   Glucose-Capillary 146 (H) 70 - 99 mg/dL    Comment: Glucose reference range applies only to samples taken after fasting for at least 8 hours.  Glucose, capillary     Status: Abnormal   Collection Time: 02/21/24  2:46 PM  Result Value Ref Range   Glucose-Capillary 150 (H) 70 - 99 mg/dL    Comment: Glucose reference range applies only to samples taken after fasting for at least 8 hours.  Glucose, capillary     Status: Abnormal   Collection Time: 02/21/24  3:51 PM  Result Value Ref Range   Glucose-Capillary 176 (H) 70 - 99 mg/dL    Comment: Glucose reference range applies only to samples taken after fasting for at least 8 hours.  Glucose, capillary     Status: Abnormal   Collection Time: 02/21/24  5:01 PM  Result Value Ref Range   Glucose-Capillary 151 (H) 70 - 99 mg/dL    Comment: Glucose reference range applies only to samples taken after fasting for  at least 8 hours.  Glucose, capillary     Status: Abnormal   Collection Time: 02/21/24  6:02 PM  Result Value Ref Range   Glucose-Capillary 120 (H) 70 - 99 mg/dL    Comment: Glucose reference range applies only to samples taken after fasting for at least 8 hours.  Glucose, capillary     Status: Abnormal   Collection Time: 02/21/24  7:24 PM  Result Value Ref Range   Glucose-Capillary 126 (H) 70 - 99 mg/dL    Comment: Glucose reference range applies only to samples taken after fasting for at least 8 hours.  Glucose, capillary     Status: Abnormal   Collection Time: 02/21/24  8:26 PM  Result Value Ref Range   Glucose-Capillary 127 (H) 70 - 99 mg/dL    Comment: Glucose reference range applies only to samples taken after fasting for at least 8 hours.  Glucose, capillary     Status: Abnormal   Collection Time: 02/21/24  9:42 PM  Result Value Ref Range   Glucose-Capillary 125 (H) 70 - 99 mg/dL    Comment: Glucose reference range applies only to samples taken after fasting for at least 8 hours.  Glucose, capillary     Status: Abnormal   Collection Time: 02/21/24 11:41 PM  Result Value Ref Range   Glucose-Capillary 120 (H) 70 - 99 mg/dL    Comment: Glucose reference range applies only to samples taken after fasting for at least 8 hours.  Glucose, capillary     Status: Abnormal   Collection Time: 02/22/24 12:42 AM  Result Value Ref Range   Glucose-Capillary 113 (H) 70 - 99 mg/dL    Comment: Glucose reference range applies only to samples taken after fasting for at least 8 hours.  Glucose, capillary     Status: Abnormal   Collection Time: 02/22/24  1:43 AM  Result Value Ref Range   Glucose-Capillary 140 (H) 70 - 99 mg/dL    Comment: Glucose reference range applies only to samples taken after fasting for at least 8 hours.  Glucose, capillary     Status: None   Collection Time: 02/22/24  2:45 AM  Result Value Ref Range   Glucose-Capillary 94 70 - 99 mg/dL    Comment: Glucose reference  range applies only to samples taken after fasting for at least 8 hours.  Glucose, capillary     Status: Abnormal   Collection Time: 02/22/24  3:46 AM  Result Value Ref Range   Glucose-Capillary 217 (H) 70 - 99 mg/dL    Comment: Glucose reference range applies only to samples taken after fasting for at least 8 hours.  Glucose, capillary     Status: Abnormal   Collection Time: 02/22/24  4:50 AM  Result Value Ref Range   Glucose-Capillary 180 (H) 70 - 99 mg/dL    Comment: Glucose reference range applies only to samples taken after fasting for at least 8 hours.  Hepatitis B surface antibody,qualitative     Status: None   Collection Time: 02/22/24  4:52 AM  Result Value Ref Range   Hep B S Ab NON REACTIVE NON REACTIVE    Comment: (NOTE) Inconsistent with immunity, less than 10 mIU/mL.  Performed at [facility], [address] [place], [place] [address]   Hepatitis B surface antigen     Status: None   Collection Time: 02/22/24  4:52 AM  Result Value Ref Range   Hepatitis B Surface Ag NON REACTIVE NON REACTIVE    Comment: Performed at [facility], [address], [place], [place] [address]  CBC with Differential/Platelet     Status: Abnormal   Collection Time: 02/22/24  4:52 AM  Result Value Ref Range   WBC 12.7 (H) 4.0 - 10.5 K/uL   RBC 3.42 (L) 3.87 - 5.11 MIL/uL   Hemoglobin 9.2 (L) 12.0 - 15.0 g/dL   HCT 70.9 (L) 63.9 - 53.9 %   MCV 84.8 80.0 - 100.0 fL   MCH 26.9 26.0 - 34.0 pg   MCHC 31.7 30.0 - 36.0 g/dL   RDW 84.1 (H) 88.4 - 84.4 %   Platelets 328 150 - 400 K/uL   nRBC 0.0 0.0 - 0.2 %   Neutrophils Relative % 64 %   Neutro Abs 8.2 (H) 1.7 - 7.7 K/uL   Lymphocytes Relative 25 %   Lymphs Abs 3.2 0.7 - 4.0 K/uL   Monocytes Relative 6 %   Monocytes Absolute 0.7 0.1 - 1.0 K/uL   Eosinophils Relative 3 %   Eosinophils Absolute 0.4 0.0 - 0.5 K/uL   Basophils Relative 1 %   Basophils Absolute 0.1 0.0 - 0.1 K/uL   Immature Granulocytes 1 %   Abs Immature  Granulocytes 0.07 0.00 - 0.07 K/uL    Comment: Performed at [facility] Lab, [address] [place], [place] [address]  Basic metabolic panel with GFR     Status: Abnormal   Collection Time: 02/22/24  4:52 AM  Result Value Ref Range   Sodium 132 (L) 135 - 145 mmol/L   Potassium 5.0 3.5 - 5.1 mmol/L    Comment: HEMOLYSIS AT THIS LEVEL MAY AFFECT RESULT   Chloride 96 (L) 98 - 111 mmol/L   CO2 19 (L) 22 - 32 mmol/L   Glucose, Bld 195 (H) 70 - 99 mg/dL  Comment: Glucose reference range applies only to samples taken after fasting for at least 8 hours.   BUN 52 (H) 6 - 20 mg/dL   Creatinine, Ser 3.11 (H) 0.44 - 1.00 mg/dL   Calcium  8.4 (L) 8.9 - 10.3 mg/dL   GFR, Estimated 8 (L) >60 mL/min    Comment: (NOTE) Calculated using the CKD-EPI Creatinine Equation (2021)    Anion gap 17 (H) 5 - 15    Comment: Performed at [facility], [address] [place], [place] [address]  Beta-hydroxybutyric acid     Status: Abnormal   Collection Time: 02/22/24  4:52 AM  Result Value Ref Range   Beta-Hydroxybutyric Acid 1.23 (H) 0.05 - 0.27 mmol/L    Comment: Performed at [facility], [address], [place], [place] [address]  Troponin I (High Sensitivity)     Status: Abnormal   Collection Time: 02/22/24  4:52 AM  Result Value Ref Range   Troponin I (High Sensitivity) 55 (H) <18 ng/L    Comment: (NOTE) Elevated high sensitivity troponin I (hsTnI) values and significant  changes across serial measurements may suggest ACS but many other  chronic and acute conditions are known to elevate hsTnI results.  Refer to the Links section for chest pain algorithms and additional  guidance. Performed at [facility], [address], [place], [place] [address]   Glucose, capillary     Status: Abnormal   Collection Time: 02/22/24  5:44 AM  Result Value Ref Range   Glucose-Capillary 166 (H) 70 - 99 mg/dL    Comment: Glucose reference range applies only to samples taken after fasting  for at least 8 hours.  Glucose, capillary     Status: Abnormal   Collection Time: 02/22/24  6:46 AM  Result Value Ref Range   Glucose-Capillary 191 (H) 70 - 99 mg/dL    Comment: Glucose reference range applies only to samples taken after fasting for at least 8 hours.  Glucose, capillary     Status: Abnormal   Collection Time: 02/22/24  7:57 AM  Result Value Ref Range   Glucose-Capillary 210 (H) 70 - 99 mg/dL    Comment: Glucose reference range applies only to samples taken after fasting for at least 8 hours.  Glucose, capillary     Status: Abnormal   Collection Time: 02/22/24  8:36 AM  Result Value Ref Range   Glucose-Capillary 234 (H) 70 - 99 mg/dL    Comment: Glucose reference range applies only to samples taken after fasting for at least 8 hours.  Glucose, capillary     Status: Abnormal   Collection Time: 02/22/24  9:44 AM  Result Value Ref Range   Glucose-Capillary 192 (H) 70 - 99 mg/dL    Comment: Glucose reference range applies only to samples taken after fasting for at least 8 hours.    CT SOFT TISSUE NECK WO CONTRAST Result Date: 02/22/2024 EXAM: CT NECK WITHOUT CONTRAST 02/22/2024 08:31:39 AM TECHNIQUE: CT of the neck was performed without the administration of intravenous contrast. Multiplanar reformatted images are provided for review. Automated exposure control, iterative reconstruction, and/or weight based adjustment of the mA/kV was utilized to reduce the radiation dose to as low as reasonably achievable. COMPARISON: Chest radiograph 02/22/2024. CLINICAL HISTORY: 33 year old female. Soft tissue infection suspected, neck, xray done. End stage renal disease. FINDINGS: AERODIGESTIVE TRACT: Retropharyngeal or prevertebral space edema is mild (series 3, image 62). Laryngeal and pharyngeal soft tissue contours are within normal limits. No discrete mass.  SALIVARY GLANDS: The parotid and submandibular glands are unremarkable. Negative noncontrast sublingual space. THYROID :  Unremarkable. LYMPH NODES: Surrounding the phlegmon-like soft tissue mass in the right posterior neck musculature, asymmetric level 5, level 2b, and level 3b lymph nodes measure up to 12 mm short axis individually (series 3, images 48 through 56). Enlarged and reactive appearing right level 4 and supraclavicular lymph nodes also measure up to 14 mm short axis (series 3, image 85). SOFT TISSUES: Abnormal indistinct phlegmon-like soft tissue mass and spiculation right posterior neck musculature, trapezius, best seen on sagittal image 45. This is in an area of about 2.3 cm. No soft tissue gas. BRAIN, ORBITS, SINUSES AND MASTOIDS: Age advanced calcified atherosclerosis at the bilateral skull base. Mild to moderate bilateral paranasal sinus mucosal thickening. Trace right mastoid effusion. Tympanic cavities well aerated. No acute dental finding. LUNGS AND MEDIASTINUM: Layering right pleural effusion in the lung apex. Mild respiratory motion and atelectatic changes in the visible upper lungs and airways. Tortuous proximal great vessels. BONES: No osseous abnormality identified. VASCULATURE: Vascular patency not evaluated in the absence of IV contrast. Age advanced bilateral carotid and calcified atherosclerosis in the neck. Age advanced calcified atherosclerosis at the bilateral skull base. IMPRESSION: 1. non-contrast CT appearance strongly suggestive of Phlegmonous soft tissue infection in the right posterior neck musculature (trapezius) measuring approximately 2.3 cm. No organized or drainable fluid collection is evident. Correlation with focused Ultrasound there may be valuable. 2. Mild retropharyngeal or prevertebral space edema, and regional lymphadenopathy which tracks inferiorly to the right supraclavicular fossa. 3. Mild to moderate bilateral paranasal sinus inflammation. Mild right mastoid effusion. 4. Layering right pleural effusion in the apex. Electronically signed by: Helayne Hurst MD 02/22/2024 08:49 AM EDT RP  Workstation: [id number]   DG Shoulder 1V Right Result Date: 02/22/2024 EXAM: 1 VIEW XRAY OF THE RIGHT SHOULDER 02/22/2024 06:20:00 AM COMPARISON: Chest radiograph today. CLINICAL HISTORY: FINDINGS: BONES AND JOINTS: Glenohumeral joint is normally aligned. No acute fracture or dislocation. The [facility] joint is unremarkable in appearance. SOFT TISSUES: Vascular stent in right upper extremity. No abnormal calcifications. Visualized lung is unremarkable. IMPRESSION: 1. No osseous abnormality of the right shoulder. 2. Right axillary vascular stent in place. Electronically signed by: Helayne Hurst MD 02/22/2024 06:41 AM EDT RP Workstation: [id number]   DG Chest Port 1 View Result Date: 02/22/2024 EXAM: 1 VIEW(S) XRAY OF THE CHEST 02/22/2024 06:20:00 AM COMPARISON: 02/20/2024 CLINICAL HISTORY: SOB (shortness of breath) [id number]. Reason for exam: shortness of breath; CHIEF COMPLAINTS: Hyperglycemia ; Principal Problem: ; DKA (diabetic ketoacidosis) (HCC) FINDINGS: LINES, TUBES AND DEVICES: Multiple overlying monitor wires noted. LUNGS AND PLEURA: Hypoinflated lungs. Bilateral interstitial prominence. Veiling right pleural effusion less apparent. No focal pulmonary opacity. No overt pulmonary edema. No pneumothorax. HEART AND MEDIASTINUM: Mild cardiomegaly with central vascular prominence. BONES AND SOFT TISSUES: No acute osseous abnormality. IMPRESSION: 1. Hypoinflated lungs with interstitial prominence. Right pleural effusion less apparent. 2. No new cardiopulmonary abnormality. Electronically signed by: Helayne Hurst MD 02/22/2024 06:40 AM EDT RP Workstation: [id number]    ROS: as above  Blood pressure (!) 167/59, pulse (!) 102, temperature 99.1 F (37.3 C), temperature source Oral, resp. rate 18, height 5' 3 (1.6 m), weight 77.1 kg, SpO2 (P) 97%.  PHYSICAL EXAM:   CONSTITUTIONAL: well developed, nourished, no distress CARDIOVASCULAR: normal rate PULMONARY/CHEST WALL: effort normal and no stridor, no stertor,  no dysphonia; easily lays flat and easily tolerating secretions HENT: Head : normocephalic (see below) Nose: nose normal and no purulence, septum intact  Mouth/Throat:  Mouth: uvula midline and no oral lesions Throat: oropharynx clear, no trismus Mucous membranes: normal EYES: conjunctiva normal, EOM normal and PERRL NECK: noted right posterior neck indurated area (appears to be more lymph node related, mobile but quite tender, no overlying skin change).  but not autodraining; no other neck masses appreciated or overlying skin change  Studies Reviewed: CT neck WITHOUT contrast (02/22/2024) independently interpreted: noted right posterior neck with likely developing abscess v/s phlegmon. Also noted to have some retropharyngeal edema but study not optimal to evaluate for abscess. Noted bilateral lymphadenopathy but no obvious noted suppurative/cystic nodes. Stranding neck and right posterior neck extending inferiorly as well CBC and BMP 02/22/2024: WBC 12.7, left shift; BUN/Cr 52/6.88 Assessment/Plan: 33 y.o. with:  Right neck phlegmon v/s abscess Retropharyngeal edema Non-contrasted CT but is clearly concerning for abscess. I discussed with patient regarding possible bedside aspiration or drainage but she does not wish for it and would like to do it under general anesthesia. As such, before subjecting her to it, do think useful to check CT with contrast to get better information in case this is more cellulitic/phlegmon rather than abscess.   - She is antibiotic naive and WBC is relatively stable. Primary team plans to start vanc/zosyn which is reasonable - CRP pending - Would rec CT Neck with contrast and NPO at midnight if abscess noted  From retropharyngeal edema standpoint, after discussion, will give dex 5mg  dose x2 and if early infection, should be covered with vanc/zosyn. Will observe currently  Eldora KATHEE Blanch   02/22/2024, 9:54 AM   Time spent face to face and in preparation/chart  check, and discussing with primary team regarding case: 85 minutes

## 2024-02-23 ENCOUNTER — Inpatient Hospital Stay (HOSPITAL_COMMUNITY): Admitting: Registered Nurse

## 2024-02-23 ENCOUNTER — Encounter (HOSPITAL_COMMUNITY)
Admission: EM | Disposition: A | Discharge status: Home Health | Payer: Self-pay | Source: Home / Self Care | Attending: Internal Medicine

## 2024-02-23 DIAGNOSIS — E119 Type 2 diabetes mellitus without complications: Secondary | ICD-10-CM

## 2024-02-23 DIAGNOSIS — E1010 Type 1 diabetes mellitus with ketoacidosis without coma: Secondary | ICD-10-CM | POA: Diagnosis not present

## 2024-02-23 DIAGNOSIS — L0211 Cutaneous abscess of neck: Secondary | ICD-10-CM | POA: Diagnosis not present

## 2024-02-23 DIAGNOSIS — I5042 Chronic combined systolic (congestive) and diastolic (congestive) heart failure: Secondary | ICD-10-CM | POA: Diagnosis not present

## 2024-02-23 DIAGNOSIS — I110 Hypertensive heart disease with heart failure: Secondary | ICD-10-CM | POA: Diagnosis not present

## 2024-02-23 HISTORY — PX: INCISION AND DRAINAGE ABSCESS: SHX5864

## 2024-02-23 LAB — BASIC METABOLIC PANEL WITH GFR
Anion gap: 16 — ABNORMAL HIGH (ref 5–15)
BUN: 60 mg/dL — ABNORMAL HIGH (ref 6–20)
CO2: 19 mmol/L — ABNORMAL LOW (ref 22–32)
Calcium: 8.7 mg/dL — ABNORMAL LOW (ref 8.9–10.3)
Chloride: 98 mmol/L (ref 98–111)
Creatinine, Ser: 7.49 mg/dL — ABNORMAL HIGH (ref 0.44–1.00)
GFR, Estimated: 7 mL/min — ABNORMAL LOW (ref >60)
Glucose, Bld: 202 mg/dL — ABNORMAL HIGH (ref 70–99)
Potassium: 5.2 mmol/L — ABNORMAL HIGH (ref 3.5–5.1)
Sodium: 133 mmol/L — ABNORMAL LOW (ref 135–145)

## 2024-02-23 LAB — MAGNESIUM: Magnesium: 2.4 mg/dL (ref 1.7–2.4)

## 2024-02-23 LAB — GLUCOSE, CAPILLARY
Glucose-Capillary: 127 mg/dL — ABNORMAL HIGH (ref 70–99)
Glucose-Capillary: 138 mg/dL — ABNORMAL HIGH (ref 70–99)
Glucose-Capillary: 139 mg/dL — ABNORMAL HIGH (ref 70–99)
Glucose-Capillary: 143 mg/dL — ABNORMAL HIGH (ref 70–99)
Glucose-Capillary: 149 mg/dL — ABNORMAL HIGH (ref 70–99)
Glucose-Capillary: 159 mg/dL — ABNORMAL HIGH (ref 70–99)
Glucose-Capillary: 163 mg/dL — ABNORMAL HIGH (ref 70–99)
Glucose-Capillary: 165 mg/dL — ABNORMAL HIGH (ref 70–99)
Glucose-Capillary: 167 mg/dL — ABNORMAL HIGH (ref 70–99)
Glucose-Capillary: 168 mg/dL — ABNORMAL HIGH (ref 70–99)
Glucose-Capillary: 190 mg/dL — ABNORMAL HIGH (ref 70–99)
Glucose-Capillary: 190 mg/dL — ABNORMAL HIGH (ref 70–99)
Glucose-Capillary: 194 mg/dL — ABNORMAL HIGH (ref 70–99)
Glucose-Capillary: 198 mg/dL — ABNORMAL HIGH (ref 70–99)
Glucose-Capillary: 235 mg/dL — ABNORMAL HIGH (ref 70–99)
Glucose-Capillary: 267 mg/dL — ABNORMAL HIGH (ref 70–99)
Glucose-Capillary: 280 mg/dL — ABNORMAL HIGH (ref 70–99)
Glucose-Capillary: 65 mg/dL — ABNORMAL LOW (ref 70–99)
Glucose-Capillary: 81 mg/dL (ref 70–99)
Glucose-Capillary: 90 mg/dL (ref 70–99)

## 2024-02-23 LAB — CBC WITH DIFFERENTIAL/PLATELET
Abs Immature Granulocytes: 0.13 K/uL — ABNORMAL HIGH (ref 0.00–0.07)
Basophils Absolute: 0 K/uL (ref 0.0–0.1)
Basophils Relative: 0 %
Eosinophils Absolute: 0 K/uL (ref 0.0–0.5)
Eosinophils Relative: 0 %
HCT: 24.8 % — ABNORMAL LOW (ref 36.0–46.0)
Hemoglobin: 7.9 g/dL — ABNORMAL LOW (ref 12.0–15.0)
Immature Granulocytes: 1 %
Lymphocytes Relative: 22 %
Lymphs Abs: 3 K/uL (ref 0.7–4.0)
MCH: 27.1 pg (ref 26.0–34.0)
MCHC: 31.9 g/dL (ref 30.0–36.0)
MCV: 84.9 fL (ref 80.0–100.0)
Monocytes Absolute: 0.9 K/uL (ref 0.1–1.0)
Monocytes Relative: 6 %
Neutro Abs: 9.5 K/uL — ABNORMAL HIGH (ref 1.7–7.7)
Neutrophils Relative %: 71 %
Platelets: 251 K/uL (ref 150–400)
RBC: 2.92 MIL/uL — ABNORMAL LOW (ref 3.87–5.11)
RDW: 15.5 % (ref 11.5–15.5)
WBC: 13.5 K/uL — ABNORMAL HIGH (ref 4.0–10.5)
nRBC: 0.2 % (ref 0.0–0.2)

## 2024-02-23 LAB — C-REACTIVE PROTEIN: CRP: 3.6 mg/dL — ABNORMAL HIGH (ref <1.0)

## 2024-02-23 LAB — BETA-HYDROXYBUTYRIC ACID
Beta-Hydroxybutyric Acid: 0.28 mmol/L — ABNORMAL HIGH (ref 0.05–0.27)
Beta-Hydroxybutyric Acid: 0.91 mmol/L — ABNORMAL HIGH (ref 0.05–0.27)

## 2024-02-23 LAB — PROCALCITONIN: Procalcitonin: 6.76 ng/mL

## 2024-02-23 SURGERY — INCISION AND DRAINAGE, ABSCESS
Anesthesia: General | Laterality: Right

## 2024-02-23 MED ORDER — EPHEDRINE 5 MG/ML INJ
INTRAVENOUS | Status: AC
  Filled 2024-02-23: qty 5

## 2024-02-23 MED ORDER — LIDOCAINE-EPINEPHRINE 1 %-1:100000 IJ SOLN
INTRAMUSCULAR | Status: AC
Start: 2024-02-23 — End: 2024-02-23
  Filled 2024-02-23: qty 1

## 2024-02-23 MED ORDER — HYDRALAZINE HCL 20 MG/ML IJ SOLN
INTRAMUSCULAR | Status: AC
Start: 2024-02-23 — End: 2024-02-23
  Administered 2024-02-23: 10 mg via INTRAVENOUS
  Filled 2024-02-23: qty 1

## 2024-02-23 MED ORDER — CHLORHEXIDINE GLUCONATE 0.12 % MT SOLN
OROMUCOSAL | Status: AC
  Filled 2024-02-23: qty 15

## 2024-02-23 MED ORDER — ROCURONIUM BROMIDE 10 MG/ML (PF) SYRINGE
PREFILLED_SYRINGE | INTRAVENOUS | Status: AC
  Filled 2024-02-23: qty 10

## 2024-02-23 MED ORDER — PHENYLEPHRINE 80 MCG/ML (10ML) SYRINGE FOR IV PUSH (FOR BLOOD PRESSURE SUPPORT)
PREFILLED_SYRINGE | INTRAVENOUS | Status: AC
Start: 2024-02-23 — End: 2024-02-23
  Filled 2024-02-23: qty 10

## 2024-02-23 MED ORDER — PROPOFOL 10 MG/ML IV BOLUS
INTRAVENOUS | Status: DC | PRN
  Administered 2024-02-23: 90 mg via INTRAVENOUS

## 2024-02-23 MED ORDER — SUGAMMADEX SODIUM 200 MG/2ML IV SOLN
INTRAVENOUS | Status: DC | PRN
  Administered 2024-02-23: 150 mg via INTRAVENOUS

## 2024-02-23 MED ORDER — LIDOCAINE 2% (20 MG/ML) 5 ML SYRINGE
INTRAMUSCULAR | Status: DC | PRN
  Administered 2024-02-23: 40 mg via INTRAVENOUS

## 2024-02-23 MED ORDER — LIDOCAINE 2% (20 MG/ML) 5 ML SYRINGE
INTRAMUSCULAR | Status: AC
Start: 2024-02-23 — End: 2024-02-23
  Filled 2024-02-23: qty 5

## 2024-02-23 MED ORDER — EPHEDRINE SULFATE-NACL 50-0.9 MG/10ML-% IV SOSY
PREFILLED_SYRINGE | INTRAVENOUS | Status: DC | PRN
  Administered 2024-02-23 (×2): 5 mg via INTRAVENOUS

## 2024-02-23 MED ORDER — HYDROMORPHONE HCL 1 MG/ML IJ SOLN
1.0000 mg | INTRAMUSCULAR | Status: DC | PRN
Start: 2024-02-23 — End: 2024-02-24
  Administered 2024-02-23 (×2): 1 mg via INTRAVENOUS
  Filled 2024-02-23 (×2): qty 1

## 2024-02-23 MED ORDER — SODIUM CHLORIDE 0.9 % IV SOLN: INTRAVENOUS | Status: DC

## 2024-02-23 MED ORDER — ONDANSETRON HCL 4 MG/2ML IJ SOLN
INTRAMUSCULAR | Status: DC | PRN
  Administered 2024-02-23: 4 mg via INTRAVENOUS

## 2024-02-23 MED ORDER — CHLORHEXIDINE GLUCONATE 0.12 % MT SOLN
15.0000 mL | Freq: Once | OROMUCOSAL | Status: AC
  Administered 2024-02-23: 15 mL via OROMUCOSAL

## 2024-02-23 MED ORDER — PHENYLEPHRINE 80 MCG/ML (10ML) SYRINGE FOR IV PUSH (FOR BLOOD PRESSURE SUPPORT)
PREFILLED_SYRINGE | INTRAVENOUS | Status: DC | PRN
  Administered 2024-02-23 (×3): 80 ug via INTRAVENOUS
  Administered 2024-02-23: 160 ug via INTRAVENOUS

## 2024-02-23 MED ORDER — MIDAZOLAM HCL (PF) 2 MG/2ML IJ SOLN
INTRAMUSCULAR | Status: DC | PRN
  Administered 2024-02-23: 1 mg via INTRAVENOUS

## 2024-02-23 MED ORDER — FENTANYL CITRATE (PF) 250 MCG/5ML IJ SOLN
INTRAMUSCULAR | Status: AC
  Filled 2024-02-23: qty 5

## 2024-02-23 MED ORDER — HYDROCODONE-ACETAMINOPHEN 5-325 MG PO TABS
1.0000 | ORAL_TABLET | Freq: Four times a day (QID) | ORAL | Status: DC | PRN
  Administered 2024-02-23 – 2024-02-24 (×2): 2 via ORAL
  Filled 2024-02-23 (×2): qty 2

## 2024-02-23 MED ORDER — VASOPRESSIN 20 UNIT/ML IV SOLN
INTRAVENOUS | Status: AC
  Filled 2024-02-23: qty 1

## 2024-02-23 MED ORDER — ROCURONIUM BROMIDE 10 MG/ML (PF) SYRINGE
PREFILLED_SYRINGE | INTRAVENOUS | Status: DC | PRN
  Administered 2024-02-23: 40 mg via INTRAVENOUS

## 2024-02-23 MED ORDER — CHLORHEXIDINE GLUCONATE CLOTH 2 % EX PADS
6.0000 | MEDICATED_PAD | Freq: Every day | CUTANEOUS | Status: DC
  Administered 2024-02-24: 6 via TOPICAL

## 2024-02-23 MED ORDER — MIDAZOLAM HCL 2 MG/2ML IJ SOLN
INTRAMUSCULAR | Status: AC
Start: 2024-02-23 — End: 2024-02-23
  Filled 2024-02-23: qty 2

## 2024-02-23 MED ORDER — GUAIFENESIN-DM 100-10 MG/5ML PO SYRP
5.0000 mL | ORAL_SOLUTION | ORAL | Status: DC | PRN
  Filled 2024-02-23: qty 5

## 2024-02-23 MED ORDER — ONDANSETRON HCL 4 MG/2ML IJ SOLN
INTRAMUSCULAR | Status: AC
  Filled 2024-02-23: qty 2

## 2024-02-23 MED ORDER — SODIUM ZIRCONIUM CYCLOSILICATE 10 G PO PACK
10.0000 g | PACK | Freq: Two times a day (BID) | ORAL | Status: AC
  Administered 2024-02-23 (×2): 10 g via ORAL
  Filled 2024-02-23 (×2): qty 1

## 2024-02-23 MED ORDER — FENTANYL CITRATE (PF) 250 MCG/5ML IJ SOLN
INTRAMUSCULAR | Status: DC | PRN
  Administered 2024-02-23 (×2): 50 ug via INTRAVENOUS

## 2024-02-23 MED ORDER — HYDRALAZINE HCL 20 MG/ML IJ SOLN
10.0000 mg | Freq: Four times a day (QID) | INTRAMUSCULAR | Status: DC | PRN
  Administered 2024-02-26 – 2024-02-28 (×3): 10 mg via INTRAVENOUS
  Filled 2024-02-23 (×2): qty 1

## 2024-02-23 MED ORDER — SODIUM CHLORIDE 0.9 % IR SOLN
Status: DC | PRN
  Administered 2024-02-23: 1000 mL

## 2024-02-23 MED ORDER — ORAL CARE MOUTH RINSE: 15.0000 mL | Freq: Once | OROMUCOSAL | Status: AC

## 2024-02-23 SURGICAL SUPPLY — 35 items
BAG COUNTER SPONGE SURGICOUNT (BAG) ×1 IMPLANT
BAND RUBBER #18 3X1/16 STRL (MISCELLANEOUS) IMPLANT
BLADE SURG 15 STRL LF DISP TIS (BLADE) IMPLANT
BNDG GAUZE DERMACEA FLUFF 4 (GAUZE/BANDAGES/DRESSINGS) IMPLANT
CATH ROBINSON RED A/P 12FR (CATHETERS) ×1 IMPLANT
COVER SURGICAL LIGHT HANDLE (MISCELLANEOUS) ×2 IMPLANT
DRAIN PENROSE 12X.25 LTX STRL (MISCELLANEOUS) ×1 IMPLANT
DRAPE HALF SHEET 40X57 (DRAPES) IMPLANT
DRAPE SURG ORHT 6 SPLT 77X108 (DRAPES) ×1 IMPLANT
ELECT COATED BLADE 2.86 ST (ELECTRODE) ×1 IMPLANT
ELECTRODE REM PT RTRN 9FT ADLT (ELECTROSURGICAL) ×1 IMPLANT
GAUZE 4X4 16PLY ~~LOC~~+RFID DBL (SPONGE) ×1 IMPLANT
GAUZE PAD ABD 8X10 STRL (GAUZE/BANDAGES/DRESSINGS) IMPLANT
GAUZE SPONGE 4X4 12PLY STRL (GAUZE/BANDAGES/DRESSINGS) IMPLANT
GAUZE STRETCH 2X75IN STRL (MISCELLANEOUS) ×1 IMPLANT
GLOVE BIO SURGEON STRL SZ 6.5 (GLOVE) ×1 IMPLANT
GOWN STRL REUS W/ TWL LRG LVL3 (GOWN DISPOSABLE) ×1 IMPLANT
KIT BASIN OR (CUSTOM PROCEDURE TRAY) ×1 IMPLANT
KIT TURNOVER KIT B (KITS) ×1 IMPLANT
MARKER SKIN DUAL TIP RULER LAB (MISCELLANEOUS) ×1 IMPLANT
NDL HYPO 25GX1X1/2 BEV (NEEDLE) ×1 IMPLANT
NEEDLE HYPO 25GX1X1/2 BEV (NEEDLE) ×1 IMPLANT
PACK SRG BSC III STRL LF ECLPS (CUSTOM PROCEDURE TRAY) ×1 IMPLANT
PAD ARMBOARD POSITIONER FOAM (MISCELLANEOUS) ×2 IMPLANT
PENCIL SMOKE EVACUATOR (MISCELLANEOUS) ×1 IMPLANT
POSITIONER HEAD DONUT 9IN (MISCELLANEOUS) IMPLANT
SOLN 0.9% NACL POUR BTL 1000ML (IV SOLUTION) ×1 IMPLANT
SUT ETHILON 2 0 FS 18 (SUTURE) ×1 IMPLANT
SUT SILK 2 0 SH CR/8 (SUTURE) IMPLANT
SWAB COLLECTION DEVICE MRSA (MISCELLANEOUS) ×1 IMPLANT
SWAB CULTURE ESWAB REG 1ML (MISCELLANEOUS) ×1 IMPLANT
SYR BULB IRRIG 60ML STRL (SYRINGE) ×1 IMPLANT
SYR CONTROL 10ML LL (SYRINGE) IMPLANT
TUBE CONNECTING 12X1/4 (SUCTIONS) ×1 IMPLANT
YANKAUER SUCT BULB TIP NO VENT (SUCTIONS) ×1 IMPLANT

## 2024-02-23 NOTE — Anesthesia Procedure Notes (Signed)
 Procedure Name: Intubation Date/Time: 02/23/2024 12:35 PM  Performed by: Myrna Homer, CRNAPre-anesthesia Checklist: Patient identified, Emergency Drugs available, Suction available and Patient being monitored Patient Re-evaluated:Patient Re-evaluated prior to induction Oxygen Delivery Method: Circle System Utilized Preoxygenation: Pre-oxygenation with 100% oxygen Induction Type: IV induction Ventilation: Mask ventilation without difficulty Laryngoscope Size: Glidescope and 3 Grade View: Grade I Tube type: Oral Tube size: 7.0 mm Number of attempts: 1 Airway Equipment and Method: Stylet and Oral airway Placement Confirmation: ETT inserted through vocal cords under direct vision, positive ETCO2 and breath sounds checked- equal and bilateral Secured at: 21 cm Tube secured with: Tape Dental Injury: Teeth and Oropharynx as per pre-operative assessment  Difficulty Due To: Difficulty was anticipated and Difficult Airway- due to limited oral opening

## 2024-02-23 NOTE — Anesthesia Preprocedure Evaluation (Addendum)
 Anesthesia Evaluation  Patient identified by MRN, date of birth, ID band Patient awake    Reviewed: Allergy & Precautions, NPO status , Patient's Chart, lab work & pertinent test results  History of Anesthesia Complications Negative for: history of anesthetic complications  Airway Mallampati: IV  TM Distance: >3 FB Neck ROM: Full  Mouth opening: Limited Mouth Opening  Dental  (+) Dental Advisory Given, Teeth Intact   Pulmonary neg pulmonary ROS   Pulmonary exam normal breath sounds clear to auscultation       Cardiovascular hypertension, Pt. on medications +CHF  Normal cardiovascular exam+ Valvular Problems/Murmurs  Rhythm:Regular Rate:Normal  Echo 08/2023  1. Left ventricular ejection fraction, by estimation, is 30 to 35%. Left  ventricular ejection fraction by 3D volume is 34 %. Left ventricular  ejection fraction by 2D MOD biplane is 29.6 %. The left ventricle has  moderately decreased function. The left  ventricle demonstrates global hypokinesis. Left ventricular diastolic  parameters are consistent with Grade II diastolic dysfunction  (pseudonormalization).   2. Right ventricular systolic function is normal. The right ventricular  size is mildly enlarged. There is normal pulmonary artery systolic  pressure. The estimated right ventricular systolic pressure is 34.4 mmHg.   3. Left atrial size was mildly dilated.   4. Right atrial size was mildly dilated.   5. The mitral valve is normal in structure. No evidence of mitral valve  regurgitation. No evidence of mitral stenosis.   6. The tricuspid valve is abnormal. Tricuspid valve regurgitation is  severe.   7. The aortic valve is tricuspid. Aortic valve regurgitation is not  visualized. No aortic stenosis is present.   8. The inferior vena cava is normal in size with <50% respiratory  variability, suggesting right atrial pressure of 8 mmHg.   9. A small pericardial effusion  is present. The pericardial effusion is  circumferential.    '25 TTE - EF 25 to 30%. Global hypokinesis. Grade II diastolic dysfunction (pseudonormalization). Mildly D-shaped septum suggestive of RV pressure/volume overload. Right ventricular systolic function is mildly reduced. Left atrial size was mildly dilated. Right atrial size was mildly dilated. Trivial mitral valve regurgitation. Tricuspid valve regurgitation is severe. A small pericardial effusion is present.     Neuro/Psych Seizures -, Well Controlled,  PSYCHIATRIC DISORDERS  Depression Bipolar Disorder      GI/Hepatic negative GI ROS, Neg liver ROS,neg GERD  ,,Patient with complaints of trouble swallowing. Last vomit was two days ago at dialysis. Patient had breakfast this morning which she finished around 8am (8hrs prior). She says the breakfast went down OK albeit she had to go slowly.   Endo/Other  diabetes, Type 1, Insulin  Dependent    K 5.5 yesterday   Renal/GU ESRF and DialysisRenal diseaseLast dialyzed two days ago.    Chemistry         Component                Value               Date/Time                 NA                       133 (L)             01/31/2024 0809           NA  136                 07/13/2021 1553           NA                       137                 11/12/2013 0423           K                        4.7                 01/31/2024 0809           K                        4.2                 11/12/2013 0423           CL                       97 (L)              01/31/2024 0809           CL                       107                 11/12/2013 0423           CO2                      24                  01/31/2024 0809           CO2                      23                  11/12/2013 0423           BUN                      30 (H)              01/31/2024 0809           BUN                      51 (H)              07/13/2021 1553           BUN                      7                    11/12/2013 0423           CREATININE               5.43 (H)            01/31/2024 0809           CREATININE  0.59 (L)            11/12/2013 0423           CREATININE               0.51                08/24/2013 0957             Component                Value               Date/Time                 CALCIUM                   8.0 (L)             01/31/2024 0809           CALCIUM                   8.2 (L)             11/12/2013 0423           ALKPHOS                  507 (H)             01/31/2024 0809           ALKPHOS                  77                  11/10/2013 2043           AST                      33                  01/31/2024 0809           AST                      12 (L)              11/10/2013 2043           ALT                      24                  01/31/2024 0809           ALT                      10 (L)              11/10/2013 2043           BILITOT                  0.8                 01/31/2024 0809           BILITOT                  <0.2                09/08/2019 0945           BILITOT  0.5                 11/10/2013 2043            Musculoskeletal negative musculoskeletal ROS (+)    Abdominal   Peds  Hematology  (+) Blood dyscrasia, anemia   Anesthesia Other Findings   Reproductive/Obstetrics                              Anesthesia Physical Anesthesia Plan  ASA: 4  Anesthesia Plan: General   Post-op Pain Management: Ofirmev  IV (intra-op)*   Induction: Intravenous  PONV Risk Score and Plan: 4 or greater and Treatment may vary due to age or medical condition, Ondansetron  and Dexamethasone  Airway Management Planned: Oral ETT and Video Laryngoscope Planned  Additional Equipment: None  Intra-op Plan:   Post-operative Plan: Extubation in OR  Informed Consent: I have reviewed the patients History and Physical, chart, labs and discussed the procedure including the risks, benefits and alternatives for  the proposed anesthesia with the patient or authorized representative who has indicated his/her understanding and acceptance.     Dental advisory given  Plan Discussed with: CRNA  Anesthesia Plan Comments: (Risks of anesthesia explained at length. This includes, but is not limited to, sore throat, damage to teeth, lips gums, tongue and vocal cords, nausea and vomiting, reactions to medications, stroke, heart attack, and death. All patient questions were answered and the patient wishes to proceed. )         Anesthesia Quick Evaluation

## 2024-02-23 NOTE — Progress Notes (Signed)
 Dialysis tx terminated d/t infiltration, pt was not cooperative and would not stop moving. Blood was unable to be returned.

## 2024-02-23 NOTE — Progress Notes (Signed)
 Progress Note   Patient: Ashley Freeman FMW:[id number] DOB: 09/19/1990 DOA: 02/20/2024     3 DOS: the patient was seen and examined on 02/23/2024   Brief hospital course: Partly taken from prior notes.   Coralee Edberg [place]-Baldwin is a 33 y.o. female with medical history significant of ESRD HD TTS, HFrEF (EF 30-35% in 08/2023), DM1, bipolar disorder, and frequent admission for DKA/volume overload p/w hyperglycemia 2/2 DKA and SOB iso volume overload 2/2 ADHF and ESRD.   Multiple prior hospitalization for similar reason.  In the ED, pt tachypneic on RA. Labs notable for VBH showing pH/CO2 of 6.96/18, Na 128, K 7.3, glucose 873, Cr 11.87, alkaline phosphatase 550, AST 47, ALT 59, T. Bili 2.4, lactate 2.2-->2.3, beta-hydroxybuterate >8, and WBC 15.7. CXR w/ volume overload, and EKG NSR.   PCCM was also consulted.  10/25: Vital Stable, improving CBG, gap still open at 19, continuing insulin  gtt. had HD last night.  Assessment and Plan: DKA (diabetic ketoacidosis) (HCC) in a patient with DM type I due to noncompliance and now appears to have a neck abscess. Secondary to not using insulin  for the past 2 days, multiple ED visits and hospitalizations for the similar reason.  History of chronic noncompliant.  Still in DKA continue insulin  drip, DKA likely worse due to neck abscess.  Monitor  Right sided soft tissue neck abscess/phlegmon.  Became symptomatic over the last 24 hours, blood cultures, MRSA nasal PCR, empiric IV antibiotics, discussed with ENT, IV Decadron, ENT on board going for incision and drainage on 02/23/2024.  Follow-up postop cultures.  ESRD with hyperkalemia.  Nephrology on board on HD sessions, noncompliant.  TTS schedule.  Lokelma  given on 02/23/2024 as she is due for a procedure.  HFrEF (heart failure with reduced ejection fraction) (HCC) History of nonischemic cardiomyopathy with EF of 30 to 35% on echo done in May 2025.  On admission significantly volume  overload with missed dialysis. Volume is being managed with dialysis. Currently euvolemic  Hypertension.  Blood pressure medications adjusted on 02/23/2024.    Bipolar disorder (HCC) - Continue home olanzapine , Lamictal  and Cymbalta  -Follow-up Lamictal  levels-still pending   Subjective: Patient in bed, in excruciating pain due to right neck discomfort, no nausea vomiting or chest pain  Physical Exam: Vitals:   02/23/24 0101 02/23/24 0322 02/23/24 0400 02/23/24 0800  BP: 129/72 128/69 (!) 160/70 (!) 165/64  Pulse: 86 78 81 72  Resp: (!) 26 (!) 21 18 17   Temp:  98.2 F (36.8 C) 98 F (36.7 C) 98.4 F (36.9 C)  TempSrc:   Oral Oral  SpO2: 96% 96% 96%   Weight:      Height:       Awake Alert, No new F.N deficits, Normal affect Apex.AT,PERRAL Right neck swelling and palpable abscess Symmetrical Chest wall movement, Good air movement bilaterally, CTAB RRR,No Gallops, Rubs or new Murmurs,  +ve B.Sounds, Abd Soft, No tenderness,   No Cyanosis, Clubbing or edema    Data Review:   Patient Lines/Drains/Airways Status     Active Line/Drains/Airways     Name Placement date Placement time Site Days   Peripheral IV 02/20/24 20 G 1.88 Left Antecubital 02/20/24  0846  Antecubital  2   Peripheral IV 02/20/24 22 G 1.75 Left;Anterior Forearm 02/20/24  0831  Forearm  2   Peripheral IV 02/21/24 22 G 2.5 Anterior;Left;Upper Arm 02/21/24  1340  Arm  1   Fistula / Graft Right Upper arm Arteriovenous vein graft 07/06/22  1013  Upper arm  596             Inpatient Medications  Scheduled Meds:  bumetanide   10 mg Oral Daily   calcitRIOL   1.25 mcg Oral Q M,W,F   carvedilol   25 mg Oral BID WC   Chlorhexidine  Gluconate Cloth  6 each Topical Q0600   DULoxetine   20 mg Oral Daily   fluticasone   2 spray Each Nare Daily   heparin   5,000 Units Subcutaneous Q8H   lamoTRIgine   200 mg Oral Daily   metroNIDAZOLE   500 mg Oral Q12H   OLANZapine  zydis  5 mg Oral QHS   rosuvastatin   40 mg Oral  Daily   sevelamer  carbonate  800 mg Oral TID WC   sodium zirconium cyclosilicate   10 g Oral BID   Continuous Infusions:  aztreonam     dextrose  10 % and 0.45 % NaCl 40 mL/hr at 02/23/24 0053   insulin  1.5 Units/hr (02/23/24 0908)   [START ON 02/25/2024] vancomycin      PRN Meds:.dextrose , hydrOXYzine , ipratropium-albuterol , naLOXone  (NARCAN )  injection  DVT Prophylaxis  heparin  injection 5,000 Units Start: 02/20/24 1400 SCDs Start: 02/20/24 1037   Recent Labs  Lab 02/18/24 0901 02/20/24 0650 02/21/24 0349 02/22/24 0452 02/23/24 0554  WBC 15.2* 15.7* 13.8* 12.7* 13.5*  HGB 9.8* 8.8* 9.0* 9.2* 7.9*  HCT 32.1* 30.6* 28.2* 29.0* 24.8*  PLT 390 333 265 328 251  MCV 89.4 95.6 84.4 84.8 84.9  MCH 27.3 27.5 26.9 26.9 27.1  MCHC 30.5 28.8* 31.9 31.7 31.9  RDW 15.5 15.9* 15.5 15.8* 15.5  LYMPHSABS 2.5 2.3  --  3.2 3.0  MONOABS 0.8 0.9  --  0.7 0.9  EOSABS 0.7* 0.0  --  0.4 0.0  BASOSABS 0.1 0.1  --  0.1 0.0    Recent Labs  Lab 02/18/24 0901 02/18/24 0903 02/20/24 0650 02/20/24 0720 02/20/24 1009 02/20/24 1455 02/21/24 0349 02/21/24 0743 02/22/24 0452 02/22/24 1906 02/23/24 0554  NA 136  --  128*  --   --    < > 134* 135 132* 133* 133*  K 5.1  --  7.3*  --   --    < > 4.6 4.3 5.0 4.5 5.2*  CL 102  --  91*  --   --    < > 96* 96* 96* 96* 98  CO2 19*  --  <7*  --   --    < > 19* 20* 19* 20* 19*  ANIONGAP 15  --  NOT CALCULATED  --   --    < > 19* 19* 17* 17* 16*  GLUCOSE 165*  --  873*  --   --    < > 179* 119* 195* 168* 202*  BUN 86*  --  107*  --   --    < > 44* 46* 52* 56* 60*  CREATININE 10.45*  --  11.87*  --   --    < > 5.80* 5.91* 6.88* 7.36* 7.49*  AST 30  --  47*  --   --   --   --   --   --   --   --   ALT 56*  --  59*  --   --   --   --   --   --   --   --   ALKPHOS 559*  --  550*  --   --   --   --   --   --   --   --  BILITOT 0.9  --  2.4*  --   --   --   --   --   --   --   --   ALBUMIN  2.7*  --  3.1*  --   --   --   --   --   --   --   --   CRP  --    --   --   --   --   --   --   --   --  4.2* 3.6*  PROCALCITON  --   --   --   --   --   --   --   --   --  7.39 6.76  LATICACIDVEN  --   --   --  2.2* 2.3*  --   --   --   --   --   --   INR  --   --   --   --   --   --   --   --   --  1.2  --   MG  --  3.2*  --   --   --   --   --   --   --   --  2.4  CALCIUM  8.3*  --  8.6*  --   --    < > 8.3* 8.6* 8.4* 8.6* 8.7*   < > = values in this interval not displayed.      Recent Labs  Lab 02/18/24 0903 02/20/24 0650 02/20/24 0720 02/20/24 1009 02/20/24 1455 02/21/24 0349 02/21/24 0743 02/22/24 0452 02/22/24 1906 02/23/24 0554  CRP  --   --   --   --   --   --   --   --  4.2* 3.6*  PROCALCITON  --   --   --   --   --   --   --   --  7.39 6.76  LATICACIDVEN  --   --  2.2* 2.3*  --   --   --   --   --   --   INR  --   --   --   --   --   --   --   --  1.2  --   MG 3.2*  --   --   --   --   --   --   --   --  2.4  CALCIUM   --    < >  --   --    < > 8.3* 8.6* 8.4* 8.6* 8.7*   < > = values in this interval not displayed.    --------------------------------------------------------------------------------------------------------------- Lab Results  Component Value Date   CHOL 235 (H) 05/24/2021   HDL 54.70 05/24/2021   LDLCALC 149 (H) 05/24/2021   TRIG 157.0 (H) 05/24/2021   CHOLHDL 4 05/24/2021    Lab Results  Component Value Date   HGBA1C 10.8 (H) 12/20/2023    Micro Results Recent Results (from the past 240 hours)  Resp panel by RT-PCR (RSV, Flu A&B, Covid) Anterior Nasal Swab     Status: None   Collection Time: 02/18/24  7:41 AM   Specimen: Anterior Nasal Swab  Result Value Ref Range Status   SARS Coronavirus 2 by RT PCR NEGATIVE NEGATIVE Final   Influenza A by PCR NEGATIVE NEGATIVE Final   Influenza B by PCR NEGATIVE NEGATIVE Final    Comment: (NOTE) The Xpert Xpress SARS-CoV-2/FLU/RSV plus  assay is intended as an aid in the diagnosis of influenza from Nasopharyngeal swab specimens and should not be used as a sole  basis for treatment. Nasal washings and aspirates are unacceptable for Xpert Xpress SARS-CoV-2/FLU/RSV testing.  Fact Sheet for Patients: bloggercourse.com  Fact Sheet for Healthcare Providers: seriousbroker.it  This test is not yet approved or cleared by the [place]  FDA and has been authorized for detection and/or diagnosis of SARS-CoV-2 by FDA under an Emergency Use Authorization (EUA). This EUA will remain in effect (meaning this test can be used) for the duration of the COVID-19 declaration under Section 564(b)(1) of the Act, 21 U.S.C. section 360bbb-3(b)(1), unless the authorization is terminated or revoked.     Resp Syncytial Virus by PCR NEGATIVE NEGATIVE Final    Comment: (NOTE) Fact Sheet for Patients: bloggercourse.com  Fact Sheet for Healthcare Providers: seriousbroker.it  This test is not yet approved or cleared by the [place]  FDA and has been authorized for detection and/or diagnosis of SARS-CoV-2 by FDA under an Emergency Use Authorization (EUA). This EUA will remain in effect (meaning this test can be used) for the duration of the COVID-19 declaration under Section 564(b)(1) of the Act, 21 U.S.C. section 360bbb-3(b)(1), unless the authorization is terminated or revoked.  Performed at [facility], [address] [place], [place] [address]   Culture, blood (Routine X 2) w Reflex to ID Panel     Status: None (Preliminary result)   Collection Time: 02/22/24  7:11 PM   Specimen: BLOOD  Result Value Ref Range Status   Specimen Description BLOOD BLOOD LEFT HAND  Final   Special Requests   Final    BOTTLES DRAWN AEROBIC AND ANAEROBIC Blood Culture results may not be optimal due to an inadequate volume of blood received in culture bottles   Culture   Final    NO GROWTH < 24 HOURS Performed at [facility], [address], [place], [place]  [address]    Report Status PENDING  Incomplete  Culture, blood (Routine X 2) w Reflex to ID Panel     Status: None (Preliminary result)   Collection Time: 02/22/24  7:14 PM   Specimen: BLOOD  Result Value Ref Range Status   Specimen Description BLOOD BLOOD LEFT HAND  Final   Special Requests   Final    BOTTLES DRAWN AEROBIC AND ANAEROBIC Blood Culture adequate volume   Culture   Final    NO GROWTH < 24 HOURS Performed at [facility], [address] [place], [place] [address]    Report Status PENDING  Incomplete    Radiology Reports  CT SOFT TISSUE NECK W CONTRAST Result Date: 02/23/2024 EXAM: CT NECK WITH CONTRAST 02/22/2024 04:08:12 PM TECHNIQUE: CT of the neck was performed with the administration of 75 mL of iohexol  (OMNIPAQUE ) 350 MG/ML injection. Multiplanar reformatted images are provided for review. Automated exposure control, iterative reconstruction, and/or weight based adjustment of the mA/kV was utilized to reduce the radiation dose to as low as reasonably achievable. COMPARISON: Neck CT without contrast earlier the same day. CLINICAL HISTORY: 33 year old female. Soft tissue infection suspected, neck, xray done. FINDINGS: AERODIGESTIVE TRACT: Small retropharyngeal space effusion is redemonstrated on series 3 image 62. No retropharyngeal space enhancement or organized fluid. Larynx and pharynx are stable and negative compared to the earlier non-contrast exam. Parapharyngeal spaces and sublingual space are stable and negative compared to the earlier non-contrast exam. SALIVARY GLANDS: Submandibular glands and parotid spaces are stable  and negative compared to the earlier non-contrast exam. THYROID : Thyroid  is stable and negative compared to the earlier non-contrast exam. LYMPH NODES: Postcontrast CT confirms a rim enhancement lesion in the right posterior neck, right level 5 lymph node station area corresponding to dominant abnormality on the non-contrast exam. This encompasses 20 x  18 x 29 mm (AP x transverse x CC), see coronal image 78. It is surrounded by hyperenhancing, rounded and enlarged lymph nodes individually measuring up to 11 mm. The lesion has internal hypodensity which appears to be complex fluid. The fluid component is roughly 10 x 11 x 19 mm. Estimated volume of the fluid component is 1 mL. Asymmetric reactive lymph nodes in that region extend up toward the right level 2 nodal station and down toward the right level 4 station. Confirmed lymphadenopathy fluid collection right posterior neck, level 5 / muscle layer. Deedra this is a suppurative lymph node lymph node abscess. SOFT TISSUES: There is regional soft tissue swelling. No soft tissue gas. BRAIN, ORBITS, SINUSES AND MASTOIDS: Chronic postoperative changes to both globes. Stable mild to moderate scattered paranasal sinus mucosal thickening. Mild right mastoid effusion. Tympanic cavities are aerated. No acute dental finding. No acute osseous abnormality. Major vascular structures in the bilateral neck and at the skull base are enhancing and patent including the right IJ. There is age advanced atherosclerosis at both carotid bifurcations and both ICA siphons. LUNGS AND MEDIASTINUM: Mild respiratory motion in the upper chest. Layering right pleural effusion is small to moderate. Patchy bilateral upper lung ground glass opacities. No apical lung consolidation. No superior mediastinal lymphadenopathy. BONES: No focal bone abnormality. No acute traumatic injury identified in the neck. IMPRESSION: 1. Positive soft tissue infection: confirmed lymphadenopathy and rim enhancing fluid collection in the right posterior neck, level 5/muscle layer. Favor a suppurative lymph node / nodal abscess (2 cm, vol 1 mL). 2. Small retropharyngeal space effusion is stable and likely reactive. Stable paranasal sinus inflammation. 3. Right lung layering effusion visible, small to moderate. 4. Age-advanced atherosclerosis at the carotid bifurcations  and ICA siphons. Electronically signed by: Helayne Hurst MD 02/23/2024 04:04 AM EDT RP Workstation: [id number]   CT SOFT TISSUE NECK WO CONTRAST Result Date: 02/22/2024 EXAM: CT NECK WITHOUT CONTRAST 02/22/2024 08:31:39 AM TECHNIQUE: CT of the neck was performed without the administration of intravenous contrast. Multiplanar reformatted images are provided for review. Automated exposure control, iterative reconstruction, and/or weight based adjustment of the mA/kV was utilized to reduce the radiation dose to as low as reasonably achievable. COMPARISON: Chest radiograph 02/22/2024. CLINICAL HISTORY: 33 year old female. Soft tissue infection suspected, neck, xray done. End stage renal disease. FINDINGS: AERODIGESTIVE TRACT: Retropharyngeal or prevertebral space edema is mild (series 3, image 62). Laryngeal and pharyngeal soft tissue contours are within normal limits. No discrete mass. SALIVARY GLANDS: The parotid and submandibular glands are unremarkable. Negative noncontrast sublingual space. THYROID : Unremarkable. LYMPH NODES: Surrounding the phlegmon-like soft tissue mass in the right posterior neck musculature, asymmetric level 5, level 2b, and level 3b lymph nodes measure up to 12 mm short axis individually (series 3, images 48 through 56). Enlarged and reactive appearing right level 4 and supraclavicular lymph nodes also measure up to 14 mm short axis (series 3, image 85). SOFT TISSUES: Abnormal indistinct phlegmon-like soft tissue mass and spiculation right posterior neck musculature, trapezius, best seen on sagittal image 45. This is in an area of about 2.3 cm. No soft tissue gas. BRAIN, ORBITS, SINUSES AND MASTOIDS: Age advanced calcified atherosclerosis at the bilateral  skull base. Mild to moderate bilateral paranasal sinus mucosal thickening. Trace right mastoid effusion. Tympanic cavities well aerated. No acute dental finding. LUNGS AND MEDIASTINUM: Layering right pleural effusion in the lung apex. Mild  respiratory motion and atelectatic changes in the visible upper lungs and airways. Tortuous proximal great vessels. BONES: No osseous abnormality identified. VASCULATURE: Vascular patency not evaluated in the absence of IV contrast. Age advanced bilateral carotid and calcified atherosclerosis in the neck. Age advanced calcified atherosclerosis at the bilateral skull base. IMPRESSION: 1. non-contrast CT appearance strongly suggestive of Phlegmonous soft tissue infection in the right posterior neck musculature (trapezius) measuring approximately 2.3 cm. No organized or drainable fluid collection is evident. Correlation with focused Ultrasound there may be valuable. 2. Mild retropharyngeal or prevertebral space edema, and regional lymphadenopathy which tracks inferiorly to the right supraclavicular fossa. 3. Mild to moderate bilateral paranasal sinus inflammation. Mild right mastoid effusion. 4. Layering right pleural effusion in the apex. Electronically signed by: Helayne Hurst MD 02/22/2024 08:49 AM EDT RP Workstation: [id number]   DG Shoulder 1V Right Result Date: 02/22/2024 EXAM: 1 VIEW XRAY OF THE RIGHT SHOULDER 02/22/2024 06:20:00 AM COMPARISON: Chest radiograph today. CLINICAL HISTORY: FINDINGS: BONES AND JOINTS: Glenohumeral joint is normally aligned. No acute fracture or dislocation. The [facility] joint is unremarkable in appearance. SOFT TISSUES: Vascular stent in right upper extremity. No abnormal calcifications. Visualized lung is unremarkable. IMPRESSION: 1. No osseous abnormality of the right shoulder. 2. Right axillary vascular stent in place. Electronically signed by: Helayne Hurst MD 02/22/2024 06:41 AM EDT RP Workstation: [id number]   DG Chest Port 1 View Result Date: 02/22/2024 EXAM: 1 VIEW(S) XRAY OF THE CHEST 02/22/2024 06:20:00 AM COMPARISON: 02/20/2024 CLINICAL HISTORY: SOB (shortness of breath) [id number]. Reason for exam: shortness of breath; CHIEF COMPLAINTS: Hyperglycemia ; Principal Problem: ; DKA  (diabetic ketoacidosis) (HCC) FINDINGS: LINES, TUBES AND DEVICES: Multiple overlying monitor wires noted. LUNGS AND PLEURA: Hypoinflated lungs. Bilateral interstitial prominence. Veiling right pleural effusion less apparent. No focal pulmonary opacity. No overt pulmonary edema. No pneumothorax. HEART AND MEDIASTINUM: Mild cardiomegaly with central vascular prominence. BONES AND SOFT TISSUES: No acute osseous abnormality. IMPRESSION: 1. Hypoinflated lungs with interstitial prominence. Right pleural effusion less apparent. 2. No new cardiopulmonary abnormality. Electronically signed by: Helayne Hurst MD 02/22/2024 06:40 AM EDT RP Workstation: [id number]      Signature  -   Lavada Stank M.D on 02/23/2024 at 9:49 AM   -  To page go to [email or web address]

## 2024-02-23 NOTE — Op Note (Signed)
 Otolaryngology Operative note  Rigby Swamy [place]-Baldwin Date/Time of Admission: 02/20/2024  6:27 AM  CSN: [id number];MRN:[id number]  DOB: 01/04/1991 Age: 33 y.o. Location: [facility] OR    Pre-Op  Diagnosis: RIGHT NECK ABSCESS  Post-Op Diagnosis: RIGHT NECK ABSCESS  Procedure: Incision and Drainage of Right Deep Neck Space Abscess (CPT 21501-RT)  Surgeon: [name], MD  Anesthesia type:  GETA  Anesthesiologist: Anesthesiologist: Darlyn Rush, MD CRNA: Myrna Homer, CRNA   Staff: Circulator: Debby Catheryn HERO, RN Scrub Person: Jarvis Hashimoto  Implants: * No implants in log *  Specimens: ID Type Source Tests Collected by Time Destination  A : Anaerobic and Aerobic Culture of Right Neck Abscess Body Fluid Path fluid AEROBIC/ANAEROBIC CULTURE W GRAM STAIN (SURGICAL/DEEP WOUND) Blanch Eldora B, MD 02/23/2024 1159     EBL:  10 mL   Drains: 1/4 inch penrose   Post-op disposition and condition: PACU, hemodynamically stable   Findings: Purulence encountered and cultured right neck   Complications: None apparent   Indications and consent: Patient is a 33 yo F with diagnoses above. The patient's options were discussed, including risks/benefits/alternatives for each option. Patient expressed understanding, and despite these risks, consented and decided to proceed with above procedures. Informed consent was signed before proceeding.   Procedure: After being properly identified in the preoperative holding area, the patient was brought into the operating suite. Patient was placed supine on the operating table. A pre-procedural time-out was performed and anesthesia was initiated.   The area of abscess was palpated and a 1 cm incision was marked. The patient was then prepped and draped in usual fashion for procedure.   A stab incision was made through skin and dermis. Hemostat was then used to dissect deeper into abscess pocket. Purulence was encountered. Wound culture was  obtained. Loculation were broken up with hemostat. Wound was irrigated with saline and a 1/4 inch penrose drain was placed within the wound cavity and secured. The skin was then cleansed and kerlix and burn net dressing were placed.   With the surgical portion of the procedure complete, all instrumentation was then removed from the operative field. Patient was returned to the care of the anesthesia team. Patient was then weaned from the anesthetic and transported to the PACU in stable condition   Eldora KATHEE Blanch

## 2024-02-23 NOTE — H&P (Signed)
 Pre-Operative H&P - Day Of Surgery Patient Name: GENNETT GARCIA Date:   02/23/2024  HPI: Reizy is a 33 y.o. female who presents today for operative treatment of right neck abscess. Patient denies recent significant changes to health or significant new medications or physiologic change in condition which would immediately impact plans. No new types of therapy has been initiated that would change the plan or the appropriateness of the plan. She reiterates that she would like an I&D to be done under anesthesia  ROS:  A complete review of systems was obtained and is otherwise negative.   PMH:  Past Medical History:  Diagnosis Date   Abscess, gluteal, right 08/24/2013   Anemia 02/19/2012   Bartholin's gland abscess 09/19/2013   Bipolar disorder (HCC)    BV (bacterial vaginosis) 11/24/2015   Depression    Diabetes mellitus type I (HCC) 2001   Diagnosed at age 86 ; Type I   Diarrhea 05/30/2016   DKA (diabetic ketoacidoses) 08/19/2013   Also in 2018   ESRD (end stage renal disease) (HCC)    Gonorrhea 08/2011   Treated in 09/2011   HFrEF (heart failure with reduced ejection fraction) (HCC)    a. 2022 Echo: EF 40%; b. 10/2021 Echo: EF 55%; b. 07/2022 MV: No ischemia. EF 31%; c. 08/2022 Echo: EF 35%, mildly dil RV, sev TR.   History of trichomoniasis 05/31/2016   Hyperlipidemia 03/28/2016   Hypertension    NICM (nonischemic cardiomyopathy) (HCC)    Sepsis (HCC) 09/19/2013    PSH:  Past Surgical History:  Procedure Laterality Date   A/V FISTULAGRAM Right 06/17/2023   Procedure: A/V Fistulagram;  Surgeon: Marea Selinda GORMAN, MD;  Location: [facility] INVASIVE CV LAB;  Service: Cardiovascular;  Laterality: Right;   A/V SHUNT INTERVENTION N/A 09/25/2023   Procedure: A/V SHUNT INTERVENTION;  Surgeon: Pearline Norman GORMAN, MD;  Location: [facility] LAB;  Service: Cardiovascular;  Laterality: N/A;   AV FISTULA PLACEMENT Right 07/06/2022   Procedure: ARTERIOVENOUS GRAFT CREATION;  Surgeon: Gretta Lonni PARAS, MD;  Location: [facility] OR;  Service: Vascular;  Laterality: Right;   CESAREAN SECTION N/A 10/05/2019   Procedure: CESAREAN SECTION;  Surgeon: Izell Harari, MD;  Location: [facility] LD ORS;  Service: Obstetrics;  Laterality: N/A;   CHOLECYSTECTOMY N/A 07/02/2023   Procedure: LAPAROSCOPIC CHOLECYSTECTOMY;  Surgeon: Ebbie Cough, MD;  Location: [facility] OR;  Service: General;  Laterality: N/A;   ESOPHAGOGASTRODUODENOSCOPY N/A 01/31/2024   Procedure: EGD (ESOPHAGOGASTRODUODENOSCOPY);  Surgeon: San Sandor GAILS, DO;  Location: [facility] ENDOSCOPY;  Service: Gastroenterology;  Laterality: N/A;   INCISION AND DRAINAGE ABSCESS Left 09/28/2019   Procedure: INCISION AND DRAINAGE VULVAR ABCESS;  Surgeon: Edsel Norleen GAILS, MD;  Location: [facility] OR;  Service: Gynecology;  Laterality: Left;   INCISION AND DRAINAGE PERIRECTAL ABSCESS Right 08/18/2013   Procedure: IRRIGATION AND DEBRIDEMENT GLUTEAL ABSCESS;  Surgeon: Lynda Leos, MD;  Location: [facility] OR;  Service: General;  Laterality: Right;   INCISION AND DRAINAGE PERIRECTAL ABSCESS Right 09/19/2013   Procedure: IRRIGATION AND DEBRIDEMENT RIGHT GLUTEAL AND LABIAL ABSCESSES;  Surgeon: Lynda Leos, MD;  Location: [facility] OR;  Service: General;  Laterality: Right;   INCISION AND DRAINAGE PERIRECTAL ABSCESS Right 09/24/2013   Procedure: IRRIGATION AND DEBRIDEMENT PERIRECTAL ABSCESS;  Surgeon: Lynwood MALVA Pina, MD;  Location: [facility] OR;  Service: General;  Laterality: Right;   IR PARACENTESIS  08/28/2023   IR PARACENTESIS  11/04/2023   IR PARACENTESIS  12/23/2023   IR PARACENTESIS  02/04/2024    MEDS:   Current  Facility-Administered Medications:    aztreonam (AZACTAM) 1 g in sodium chloride  0.9 % 100 mL IVPB, 1 g, Intravenous, QHS, Singh, Prashant K, MD   bumetanide  (BUMEX ) tablet 10 mg, 10 mg, Oral, Daily, Georgina Basket, MD, 10 mg at 02/22/24 0920   calcitRIOL  (ROCALTROL ) capsule 1.25 mcg, 1.25 mcg, Oral, Q M,W,F, Georgina Basket, MD   carvedilol  (COREG ) tablet 25 mg, 25  mg, Oral, BID WC, Georgina Basket, MD, 25 mg at 02/22/24 1719   Chlorhexidine  Gluconate Cloth 2 % PADS 6 each, 6 each, Topical, Q0600, Lenon Charmaine BRAVO, NP, 6 each at 02/23/24 [phone]   dextrose  10 % and 0.45 % NaCl infusion, , Intravenous, Continuous, Claudene Toribio BROCKS, MD, Last Rate: 40 mL/hr at 02/23/24 0053, New Bag at 02/23/24 0053   dextrose  50 % solution 0-50 mL, 0-50 mL, Intravenous, PRN, Almousa, Siham, MD   DULoxetine  (CYMBALTA ) DR capsule 20 mg, 20 mg, Oral, Daily, Georgina Basket, MD, 20 mg at 02/22/24 0920   fluticasone  (FLONASE ) 50 MCG/ACT nasal spray 2 spray, 2 spray, Each Nare, Daily, Georgina Basket, MD, 2 spray at 02/22/24 1050   heparin  injection 5,000 Units, 5,000 Units, Subcutaneous, Q8H, Georgina Basket, MD, 5,000 Units at 02/23/24 9346   hydrOXYzine  (ATARAX ) tablet 25 mg, 25 mg, Oral, TID PRN, Georgina Basket, MD, 25 mg at 02/21/24 2215   insulin  regular, human (MYXREDLIN ) 100 units/ 100 mL infusion, , Intravenous, Continuous, Almousa, Siham, MD, Last Rate: 1.5 mL/hr at 02/23/24 0908, 1.5 Units/hr at 02/23/24 0908   ipratropium-albuterol  (DUONEB) 0.5-2.5 (3) MG/3ML nebulizer solution 3 mL, 3 mL, Nebulization, Q6H PRN, Georgina Basket, MD   lamoTRIgine  (LAMICTAL ) tablet 200 mg, 200 mg, Oral, Daily, Georgina Basket, MD, 200 mg at 02/22/24 0920   metroNIDAZOLE  (FLAGYL ) tablet 500 mg, 500 mg, Oral, Q12H, Singh, Prashant K, MD, 500 mg at 02/22/24 2157   naloxone  (NARCAN ) injection 0.4 mg, 0.4 mg, Intravenous, PRN, Rathore, Vasundhra, MD   OLANZapine  zydis (ZYPREXA ) disintegrating tablet 5 mg, 5 mg, Oral, QHS, Georgina Basket, MD, 5 mg at 02/22/24 2157   rosuvastatin  (CRESTOR ) tablet 40 mg, 40 mg, Oral, Daily, Georgina Basket, MD, 40 mg at 02/22/24 0920   sevelamer  carbonate (RENVELA ) tablet 800 mg, 800 mg, Oral, TID WC, Georgina Basket, MD, 800 mg at 02/22/24 0920   [START ON 02/25/2024] vancomycin  (VANCOREADY) IVPB 750 mg/150 mL, 750 mg, Intravenous, Q T,Th,Sa-HD, Dennise, Lavada POUR,  MD  ALLERGIES: Keflex  [cephalexin ], Penicillins, Vibramycin  [doxycycline ], Benadryl  [diphenhydramine ], Dilaudid  [hydromorphone ], Methotrexate and trimetrexate, and Roxicodone  [oxycodone ]  EXAM: Vitals: BP (!) 165/64 (BP Location: Left Leg)   Pulse 72   Temp 98.4 F (36.9 C) (Oral)   Resp 17   Ht 5' 3 (1.6 m)   Wt 77.1 kg   SpO2 96%   BMI 30.11 kg/m   General Awake, at baseline alertness.   HEENT No scleral icterus or conjunctival hemorrhage. Globe position appears normal. External ears  normal. Nose patent without rhinorrhea. No thyromegaly  Cardiovascular No cyanosis.  Pulmonary No audible stridor. Breathing easily with no labor.  Neuro Symmetric facial movement.   Psychiatry Appropriate affect and mood.  Skin No scars or lesions on face or neck.  Extermities Moves all extremities with normal range of motion.   Other Findings Right neck palpable indurated area.   Assessment & Plan: Xandra has diagnoses of right neck abscess and will go to the OR today for right neck abscess incision and drainage. All questions have been answered, and risks/benefits/alternatives of procedure as noted in the consent  were discussed in a quiet area. Questions were invited and answered. The patient expressed understanding, provided consent and wished to proceed despite risks.  Eldora KATHEE Blanch 02/23/2024 9:12 AM

## 2024-02-23 NOTE — Plan of Care (Signed)

## 2024-02-23 NOTE — Anesthesia Postprocedure Evaluation (Signed)
 Anesthesia Post Note  Patient: Ashley Freeman  Procedure(s) Performed: INCISION AND DRAINAGE, ABSCESS (Right)     Patient location during evaluation: PACU Anesthesia Type: General Level of consciousness: sedated and patient cooperative Pain management: pain level controlled Vital Signs Assessment: post-procedure vital signs reviewed and stable Respiratory status: spontaneous breathing Cardiovascular status: stable Anesthetic complications: no   No notable events documented.  Last Vitals:  Vitals:   02/23/24 1923 02/23/24 1924  BP: (!) 154/79   Pulse:    Resp: 20 16  Temp:  (!) 36.1 C  SpO2:      Last Pain:  Vitals:   02/23/24 1943  TempSrc:   PainSc: 9                  Norleen Pope

## 2024-02-23 NOTE — Transfer of Care (Signed)
 Immediate Anesthesia Transfer of Care Note  Patient: Ashley Freeman  Procedure(s) Performed: INCISION AND DRAINAGE, ABSCESS (Right)  Patient Location: PACU  Anesthesia Type:General  Level of Consciousness: drowsy, pateint uncooperative, and responds to stimulation  Airway & Oxygen Therapy: Patient Spontanous Breathing and Patient connected to face mask oxygen  Post-op Assessment: Report given to RN and Post -op Vital signs reviewed and stable  Post vital signs: Reviewed and stable  Last Vitals:  Vitals Value Taken Time  BP 99/78 02/23/24 13:10  Temp    Pulse 65 02/23/24 13:13  Resp 25 02/23/24 13:13  SpO2 98 % 02/23/24 13:13  Vitals shown include unfiled device data.  Last Pain:  Vitals:   02/23/24 1202  TempSrc: Oral  PainSc: 0-No pain         Complications: No notable events documented.

## 2024-02-23 NOTE — Progress Notes (Signed)
 [facility] Progress Note   Subjective:    Seen and examined patient at bedside. Noted HD was terminated yesterday 2nd access infiltration. It was noted of patient being uncooperative and wouldn't stop moving during treatment. Blood was unable to be returned. Current K+ 5.2 and Lokema X 2 already ordered. R AVG with (+) B/T. No swelling or erythema noted. Will keep RUE elevated and apply cold compress for today. Plan for HD tomorrow.  Objective Vitals:   02/23/24 0101 02/23/24 0322 02/23/24 0400 02/23/24 0800  BP: 129/72 128/69 (!) 160/70 (!) 165/64  Pulse: 86 78 81 72  Resp: (!) 26 (!) 21 18 17   Temp:  98.2 F (36.8 C) 98 F (36.7 C) 98.4 F (36.9 C)  TempSrc:   Oral Oral  SpO2: 96% 96% 96%   Weight:      Height:       Physical Exam General: Awake, alert, NAD Heart: S1 and S2; No MRGs Lungs: Clear anteriorly Abdomen: Soft and non-tender Extremities: No LE edema Dialysis Access: R AVG (+) B/T; No swelling or erythema   Filed Weights   02/20/24 0633  Weight: 77.1 kg    Intake/Output Summary (Last 24 hours) at 02/23/2024 1108 Last data filed at 02/23/2024 0411 Gross per 24 hour  Intake 360 ml  Output --  Net 360 ml    Additional Objective Labs: Basic Metabolic Panel: Recent Labs  Lab 02/22/24 0452 02/22/24 1906 02/23/24 0554  NA 132* 133* 133*  K 5.0 4.5 5.2*  CL 96* 96* 98  CO2 19* 20* 19*  GLUCOSE 195* 168* 202*  BUN 52* 56* 60*  CREATININE 6.88* 7.36* 7.49*  CALCIUM  8.4* 8.6* 8.7*   Liver Function Tests: Recent Labs  Lab 02/18/24 0901 02/20/24 0650  AST 30 47*  ALT 56* 59*  ALKPHOS 559* 550*  BILITOT 0.9 2.4*  PROT 5.6* 5.6*  ALBUMIN  2.7* 3.1*   Recent Labs  Lab 02/18/24 0901 02/20/24 0935  LIPASE 20 18   CBC: Recent Labs  Lab 02/18/24 0901 02/20/24 0650 02/21/24 0349 02/22/24 0452 02/23/24 0554  WBC 15.2* 15.7* 13.8* 12.7* 13.5*  NEUTROABS 11.0* 12.3*  --  8.2* 9.5*  HGB 9.8* 8.8* 9.0* 9.2* 7.9*  HCT 32.1*  30.6* 28.2* 29.0* 24.8*  MCV 89.4 95.6 84.4 84.8 84.9  PLT 390 333 265 328 251   Blood Culture    Component Value Date/Time   SDES BLOOD BLOOD LEFT HAND 02/22/2024 1914   SPECREQUEST  02/22/2024 1914    BOTTLES DRAWN AEROBIC AND ANAEROBIC Blood Culture adequate volume   CULT  02/22/2024 1914    NO GROWTH < 24 HOURS Performed at [facility], [address], [place], [place] [address]    REPTSTATUS PENDING 02/22/2024 1914    Cardiac Enzymes: No results for input(s): CKTOTAL, CKMB, CKMBINDEX, TROPONINI in the last 168 hours. CBG: Recent Labs  Lab 02/23/24 0650 02/23/24 0803 02/23/24 0905 02/23/24 1001 02/23/24 1102  GLUCAP 190* 163* 194* 165* 139*   Iron  Studies: No results for input(s): IRON , TIBC, TRANSFERRIN, FERRITIN in the last 72 hours. Lab Results  Component Value Date   INR 1.2 02/22/2024   INR 1.3 (H) 02/04/2024   INR 1.3 (H) 12/29/2023   Studies/Results: CT SOFT TISSUE NECK W CONTRAST Result Date: 02/23/2024 EXAM: CT NECK WITH CONTRAST 02/22/2024 04:08:12 PM TECHNIQUE: CT of the neck was performed with the administration of 75 mL of iohexol  (OMNIPAQUE ) 350 MG/ML injection. Multiplanar reformatted images are provided for review. Automated  exposure control, iterative reconstruction, and/or weight based adjustment of the mA/kV was utilized to reduce the radiation dose to as low as reasonably achievable. COMPARISON: Neck CT without contrast earlier the same day. CLINICAL HISTORY: 33 year old female. Soft tissue infection suspected, neck, xray done. FINDINGS: AERODIGESTIVE TRACT: Small retropharyngeal space effusion is redemonstrated on series 3 image 62. No retropharyngeal space enhancement or organized fluid. Larynx and pharynx are stable and negative compared to the earlier non-contrast exam. Parapharyngeal spaces and sublingual space are stable and negative compared to the earlier non-contrast exam. SALIVARY GLANDS: Submandibular glands and parotid  spaces are stable and negative compared to the earlier non-contrast exam. THYROID : Thyroid  is stable and negative compared to the earlier non-contrast exam. LYMPH NODES: Postcontrast CT confirms a rim enhancement lesion in the right posterior neck, right level 5 lymph node station area corresponding to dominant abnormality on the non-contrast exam. This encompasses 20 x 18 x 29 mm (AP x transverse x CC), see coronal image 78. It is surrounded by hyperenhancing, rounded and enlarged lymph nodes individually measuring up to 11 mm. The lesion has internal hypodensity which appears to be complex fluid. The fluid component is roughly 10 x 11 x 19 mm. Estimated volume of the fluid component is 1 mL. Asymmetric reactive lymph nodes in that region extend up toward the right level 2 nodal station and down toward the right level 4 station. Confirmed lymphadenopathy fluid collection right posterior neck, level 5 / muscle layer. Deedra this is a suppurative lymph node lymph node abscess. SOFT TISSUES: There is regional soft tissue swelling. No soft tissue gas. BRAIN, ORBITS, SINUSES AND MASTOIDS: Chronic postoperative changes to both globes. Stable mild to moderate scattered paranasal sinus mucosal thickening. Mild right mastoid effusion. Tympanic cavities are aerated. No acute dental finding. No acute osseous abnormality. Major vascular structures in the bilateral neck and at the skull base are enhancing and patent including the right IJ. There is age advanced atherosclerosis at both carotid bifurcations and both ICA siphons. LUNGS AND MEDIASTINUM: Mild respiratory motion in the upper chest. Layering right pleural effusion is small to moderate. Patchy bilateral upper lung ground glass opacities. No apical lung consolidation. No superior mediastinal lymphadenopathy. BONES: No focal bone abnormality. No acute traumatic injury identified in the neck. IMPRESSION: 1. Positive soft tissue infection: confirmed lymphadenopathy and rim  enhancing fluid collection in the right posterior neck, level 5/muscle layer. Favor a suppurative lymph node / nodal abscess (2 cm, vol 1 mL). 2. Small retropharyngeal space effusion is stable and likely reactive. Stable paranasal sinus inflammation. 3. Right lung layering effusion visible, small to moderate. 4. Age-advanced atherosclerosis at the carotid bifurcations and ICA siphons. Electronically signed by: Helayne Hurst MD 02/23/2024 04:04 AM EDT RP Workstation: [id number]   CT SOFT TISSUE NECK WO CONTRAST Result Date: 02/22/2024 EXAM: CT NECK WITHOUT CONTRAST 02/22/2024 08:31:39 AM TECHNIQUE: CT of the neck was performed without the administration of intravenous contrast. Multiplanar reformatted images are provided for review. Automated exposure control, iterative reconstruction, and/or weight based adjustment of the mA/kV was utilized to reduce the radiation dose to as low as reasonably achievable. COMPARISON: Chest radiograph 02/22/2024. CLINICAL HISTORY: 33 year old female. Soft tissue infection suspected, neck, xray done. End stage renal disease. FINDINGS: AERODIGESTIVE TRACT: Retropharyngeal or prevertebral space edema is mild (series 3, image 62). Laryngeal and pharyngeal soft tissue contours are within normal limits. No discrete mass. SALIVARY GLANDS: The parotid and submandibular glands are unremarkable. Negative noncontrast sublingual space. THYROID : Unremarkable. LYMPH NODES: Surrounding the phlegmon-like  soft tissue mass in the right posterior neck musculature, asymmetric level 5, level 2b, and level 3b lymph nodes measure up to 12 mm short axis individually (series 3, images 48 through 56). Enlarged and reactive appearing right level 4 and supraclavicular lymph nodes also measure up to 14 mm short axis (series 3, image 85). SOFT TISSUES: Abnormal indistinct phlegmon-like soft tissue mass and spiculation right posterior neck musculature, trapezius, best seen on sagittal image 45. This is in an area  of about 2.3 cm. No soft tissue gas. BRAIN, ORBITS, SINUSES AND MASTOIDS: Age advanced calcified atherosclerosis at the bilateral skull base. Mild to moderate bilateral paranasal sinus mucosal thickening. Trace right mastoid effusion. Tympanic cavities well aerated. No acute dental finding. LUNGS AND MEDIASTINUM: Layering right pleural effusion in the lung apex. Mild respiratory motion and atelectatic changes in the visible upper lungs and airways. Tortuous proximal great vessels. BONES: No osseous abnormality identified. VASCULATURE: Vascular patency not evaluated in the absence of IV contrast. Age advanced bilateral carotid and calcified atherosclerosis in the neck. Age advanced calcified atherosclerosis at the bilateral skull base. IMPRESSION: 1. non-contrast CT appearance strongly suggestive of Phlegmonous soft tissue infection in the right posterior neck musculature (trapezius) measuring approximately 2.3 cm. No organized or drainable fluid collection is evident. Correlation with focused Ultrasound there may be valuable. 2. Mild retropharyngeal or prevertebral space edema, and regional lymphadenopathy which tracks inferiorly to the right supraclavicular fossa. 3. Mild to moderate bilateral paranasal sinus inflammation. Mild right mastoid effusion. 4. Layering right pleural effusion in the apex. Electronically signed by: Helayne Hurst MD 02/22/2024 08:49 AM EDT RP Workstation: [id number]   DG Shoulder 1V Right Result Date: 02/22/2024 EXAM: 1 VIEW XRAY OF THE RIGHT SHOULDER 02/22/2024 06:20:00 AM COMPARISON: Chest radiograph today. CLINICAL HISTORY: FINDINGS: BONES AND JOINTS: Glenohumeral joint is normally aligned. No acute fracture or dislocation. The [facility] joint is unremarkable in appearance. SOFT TISSUES: Vascular stent in right upper extremity. No abnormal calcifications. Visualized lung is unremarkable. IMPRESSION: 1. No osseous abnormality of the right shoulder. 2. Right axillary vascular stent in place.  Electronically signed by: Helayne Hurst MD 02/22/2024 06:41 AM EDT RP Workstation: [id number]   DG Chest Port 1 View Result Date: 02/22/2024 EXAM: 1 VIEW(S) XRAY OF THE CHEST 02/22/2024 06:20:00 AM COMPARISON: 02/20/2024 CLINICAL HISTORY: SOB (shortness of breath) [id number]. Reason for exam: shortness of breath; CHIEF COMPLAINTS: Hyperglycemia ; Principal Problem: ; DKA (diabetic ketoacidosis) (HCC) FINDINGS: LINES, TUBES AND DEVICES: Multiple overlying monitor wires noted. LUNGS AND PLEURA: Hypoinflated lungs. Bilateral interstitial prominence. Veiling right pleural effusion less apparent. No focal pulmonary opacity. No overt pulmonary edema. No pneumothorax. HEART AND MEDIASTINUM: Mild cardiomegaly with central vascular prominence. BONES AND SOFT TISSUES: No acute osseous abnormality. IMPRESSION: 1. Hypoinflated lungs with interstitial prominence. Right pleural effusion less apparent. 2. No new cardiopulmonary abnormality. Electronically signed by: Helayne Hurst MD 02/22/2024 06:40 AM EDT RP Workstation: [id number]    Medications:  aztreonam     dextrose  10 % and 0.45 % NaCl 40 mL/hr at 02/23/24 0053   insulin  0.6 Units/hr (02/23/24 1105)   [START ON 02/25/2024] vancomycin       bumetanide   10 mg Oral Daily   calcitRIOL   1.25 mcg Oral Q M,W,F   carvedilol   25 mg Oral BID WC   Chlorhexidine  Gluconate Cloth  6 each Topical Q0600   DULoxetine   20 mg Oral Daily   fluticasone   2 spray Each Nare Daily   heparin   5,000 Units Subcutaneous Q8H   lamoTRIgine   200 mg Oral Daily   metroNIDAZOLE   500 mg Oral Q12H   OLANZapine  zydis  5 mg Oral QHS   rosuvastatin   40 mg Oral Daily   sevelamer  carbonate  800 mg Oral TID WC   sodium zirconium cyclosilicate   10 g Oral BID    Dialysis Orders: [facility]), Monday/Wednesday/Friday, 3 hours 45 minutes, BFR 450/DFR 800, right upper arm AV graft, EDW 61 kg, 2K/2.5 calcium . Heparin  1000 unit bolus and 600 units/h. Calcitriol  1 mcg q. hemodialysis  with Sensipar  60 mg q. hemodialysis. Also gets Tums 1500 mg q. hemodialysis. Unclear when last dialyzed.   Assessment/Plan: 1.  Diabetic ketoacidosis: Appears to be from a combination of having missed her insulin  and what appears to be a right neck/submandibular infection.  Glycemic control improving on insulin  drip and on antibiotics/corticosteroids. 2. ESRD: Poorly adherent to outpatient dialysis and usually on TTS schedule. Noted HD was terminated yesterday 2nd access infiltration. R AVG with (+) B/T with no erythema or swelling present. Noted patient wouldn't stop moving. Will try HD again tomorrow (02/24/24). 3. Hyperkalemia: See above. K+ 5.2 and Lokelma  X 2 already ordered. Checking labs in AM. 4. Anemia: Low/stable hemoglobin and hematocrit overnight without overt blood loss.  Will continue to monitor to determine need to restart ESA versus PRBC transfusion. 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  Await phosphorus level with subsequent labs. 6. Nutrition: Resume renal/carb modified diet.  No evidence of dysphagia but has some odynophagia. 7. Hypertension: Blood pressure elevated, likely exacerbated by ongoing pain.  Monitor with ultrafiltration at hemodialysis. 8. Dispo: Inpatient  Charmaine Piety, NP [facility] 02/23/2024,11:08 AM  LOS: 3 days

## 2024-02-24 ENCOUNTER — Encounter (HOSPITAL_COMMUNITY): Payer: Self-pay | Admitting: Otolaryngology

## 2024-02-24 DIAGNOSIS — E1010 Type 1 diabetes mellitus with ketoacidosis without coma: Secondary | ICD-10-CM | POA: Diagnosis not present

## 2024-02-24 LAB — GLUCOSE, CAPILLARY
Glucose-Capillary: 123 mg/dL — ABNORMAL HIGH (ref 70–99)
Glucose-Capillary: 127 mg/dL — ABNORMAL HIGH (ref 70–99)
Glucose-Capillary: 132 mg/dL — ABNORMAL HIGH (ref 70–99)
Glucose-Capillary: 132 mg/dL — ABNORMAL HIGH (ref 70–99)
Glucose-Capillary: 158 mg/dL — ABNORMAL HIGH (ref 70–99)
Glucose-Capillary: 161 mg/dL — ABNORMAL HIGH (ref 70–99)
Glucose-Capillary: 184 mg/dL — ABNORMAL HIGH (ref 70–99)
Glucose-Capillary: 198 mg/dL — ABNORMAL HIGH (ref 70–99)
Glucose-Capillary: 208 mg/dL — ABNORMAL HIGH (ref 70–99)
Glucose-Capillary: 212 mg/dL — ABNORMAL HIGH (ref 70–99)

## 2024-02-24 LAB — BASIC METABOLIC PANEL WITH GFR
Anion gap: 16 — ABNORMAL HIGH (ref 5–15)
BUN: 67 mg/dL — ABNORMAL HIGH (ref 6–20)
CO2: 19 mmol/L — ABNORMAL LOW (ref 22–32)
Calcium: 8.3 mg/dL — ABNORMAL LOW (ref 8.9–10.3)
Chloride: 100 mmol/L (ref 98–111)
Creatinine, Ser: 8.19 mg/dL — ABNORMAL HIGH (ref 0.44–1.00)
GFR, Estimated: 6 mL/min — ABNORMAL LOW (ref >60)
Glucose, Bld: 144 mg/dL — ABNORMAL HIGH (ref 70–99)
Potassium: 5.4 mmol/L — ABNORMAL HIGH (ref 3.5–5.1)
Sodium: 135 mmol/L (ref 135–145)

## 2024-02-24 LAB — CBC WITH DIFFERENTIAL/PLATELET
Abs Immature Granulocytes: 0.12 K/uL — ABNORMAL HIGH (ref 0.00–0.07)
Basophils Absolute: 0 K/uL (ref 0.0–0.1)
Basophils Relative: 0 %
Eosinophils Absolute: 0.3 K/uL (ref 0.0–0.5)
Eosinophils Relative: 3 %
HCT: 26.1 % — ABNORMAL LOW (ref 36.0–46.0)
Hemoglobin: 8.3 g/dL — ABNORMAL LOW (ref 12.0–15.0)
Immature Granulocytes: 1 %
Lymphocytes Relative: 39 %
Lymphs Abs: 5.1 K/uL — ABNORMAL HIGH (ref 0.7–4.0)
MCH: 27.1 pg (ref 26.0–34.0)
MCHC: 31.8 g/dL (ref 30.0–36.0)
MCV: 85.3 fL (ref 80.0–100.0)
Monocytes Absolute: 0.7 K/uL (ref 0.1–1.0)
Monocytes Relative: 6 %
Neutro Abs: 6.7 K/uL (ref 1.7–7.7)
Neutrophils Relative %: 51 %
Platelets: 252 K/uL (ref 150–400)
RBC: 3.06 MIL/uL — ABNORMAL LOW (ref 3.87–5.11)
RDW: 15.9 % — ABNORMAL HIGH (ref 11.5–15.5)
WBC: 13 K/uL — ABNORMAL HIGH (ref 4.0–10.5)
nRBC: 0 % (ref 0.0–0.2)

## 2024-02-24 LAB — MAGNESIUM: Magnesium: 2.4 mg/dL (ref 1.7–2.4)

## 2024-02-24 LAB — BETA-HYDROXYBUTYRIC ACID
Beta-Hydroxybutyric Acid: 0.12 mmol/L (ref 0.05–0.27)
Beta-Hydroxybutyric Acid: 0.1 mmol/L (ref 0.05–0.27)

## 2024-02-24 LAB — PROCALCITONIN: Procalcitonin: 5.9 ng/mL

## 2024-02-24 LAB — C-REACTIVE PROTEIN: CRP: 3.1 mg/dL — ABNORMAL HIGH (ref <1.0)

## 2024-02-24 MED ORDER — ALTEPLASE 2 MG IJ SOLR: 2.0000 mg | Freq: Once | INTRAMUSCULAR | Status: DC | PRN

## 2024-02-24 MED ORDER — LIDOCAINE-PRILOCAINE 2.5-2.5 % EX CREA: 1.0000 | TOPICAL_CREAM | CUTANEOUS | Status: DC | PRN

## 2024-02-24 MED ORDER — PENTAFLUOROPROP-TETRAFLUOROETH EX AERO: 1.0000 | INHALATION_SPRAY | CUTANEOUS | Status: DC | PRN

## 2024-02-24 MED ORDER — HYDROCODONE-ACETAMINOPHEN 5-325 MG PO TABS
1.0000 | ORAL_TABLET | Freq: Four times a day (QID) | ORAL | Status: DC | PRN
  Administered 2024-02-24 (×2): 1 via ORAL
  Filled 2024-02-24 (×2): qty 1

## 2024-02-24 MED ORDER — LINEZOLID 600 MG PO TABS
600.0000 mg | ORAL_TABLET | Freq: Two times a day (BID) | ORAL | Status: DC
  Administered 2024-02-24 – 2024-02-25 (×4): 600 mg via ORAL
  Filled 2024-02-24 (×5): qty 1

## 2024-02-24 MED ORDER — HYDRALAZINE HCL 50 MG PO TABS
50.0000 mg | ORAL_TABLET | Freq: Three times a day (TID) | ORAL | Status: DC
  Administered 2024-02-24 – 2024-02-27 (×8): 50 mg via ORAL
  Filled 2024-02-24 (×9): qty 1

## 2024-02-24 MED ORDER — HEPARIN SODIUM (PORCINE) 1000 UNIT/ML DIALYSIS: 1000.0000 [IU] | INTRAMUSCULAR | Status: DC | PRN

## 2024-02-24 MED ORDER — INSULIN GLARGINE-YFGN 100 UNIT/ML ~~LOC~~ SOLN
10.0000 [IU] | Freq: Every day | SUBCUTANEOUS | Status: DC
  Administered 2024-02-24 – 2024-02-27 (×4): 10 [IU] via SUBCUTANEOUS
  Filled 2024-02-24 (×5): qty 0.1

## 2024-02-24 MED ORDER — INSULIN ASPART 100 UNIT/ML IJ SOLN
0.0000 [IU] | Freq: Three times a day (TID) | INTRAMUSCULAR | Status: DC
  Administered 2024-02-24: 2 [IU] via SUBCUTANEOUS
  Administered 2024-02-24: 1 [IU] via SUBCUTANEOUS
  Administered 2024-02-25 (×3): 5 [IU] via SUBCUTANEOUS
  Administered 2024-02-26 (×2): 2 [IU] via SUBCUTANEOUS
  Administered 2024-02-27: 1 [IU] via SUBCUTANEOUS
  Administered 2024-02-27 – 2024-02-28 (×2): 3 [IU] via SUBCUTANEOUS
  Administered 2024-02-28: 5 [IU] via SUBCUTANEOUS
  Filled 2024-02-24: qty 3
  Filled 2024-02-24: qty 1

## 2024-02-24 MED ORDER — INSULIN ASPART 100 UNIT/ML IJ SOLN
0.0000 [IU] | Freq: Every day | INTRAMUSCULAR | Status: DC
  Administered 2024-02-25: 2 [IU] via SUBCUTANEOUS

## 2024-02-24 MED ORDER — SODIUM BICARBONATE 650 MG PO TABS
650.0000 mg | ORAL_TABLET | Freq: Two times a day (BID) | ORAL | Status: DC
  Administered 2024-02-24 – 2024-02-28 (×9): 650 mg via ORAL
  Filled 2024-02-24 (×9): qty 1

## 2024-02-24 MED ORDER — HYDROMORPHONE HCL 1 MG/ML IJ SOLN
0.5000 mg | Freq: Once | INTRAMUSCULAR | Status: AC | PRN
  Administered 2024-02-24: 0.5 mg via INTRAVENOUS
  Filled 2024-02-24: qty 0.5

## 2024-02-24 MED ORDER — LIDOCAINE HCL (PF) 1 % IJ SOLN: 5.0000 mL | INTRAMUSCULAR | Status: DC | PRN

## 2024-02-24 NOTE — Progress Notes (Signed)
 VAST consult received this am to place midline (approved by nephrology). Patient with restricted right arm due to AVF. Pt currently has a working PIV in upper left arm. Contacted unit nurse via SecureChat to advise that midline will be placed once current IV no longer working; best practice to leave current working IV in place.

## 2024-02-24 NOTE — Plan of Care (Signed)

## 2024-02-24 NOTE — Progress Notes (Addendum)
 Pharmacy Antibiotic Note  Ashley Freeman is a 33 y.o. female admitted on 02/20/2024 with neck abscess.  Pharmacy has been consulted to change vanc to linezolid .  Plan: Pt covered w/ vanc through next HD.  Will start linezolid  600mg  IV Q12H after next HD (usually TTS but terminated d/t access issue 10/26, plan for HD today). Also on aztreonam and metronidazole .  Height: 5' 3 (160 cm) Weight: 77.1 kg (170 lb) IBW/kg (Calculated) : 52.4  Temp (24hrs), Avg:97.9 F (36.6 C), Min:97 F (36.1 C), Max:98.4 F (36.9 C)  Recent Labs  Lab 02/18/24 0901 02/20/24 0650 02/20/24 0720 02/20/24 1009 02/20/24 1455 02/21/24 0349 02/21/24 0743 02/22/24 0452 02/22/24 1906 02/23/24 0554  WBC 15.2* 15.7*  --   --   --  13.8*  --  12.7*  --  13.5*  CREATININE 10.45* 11.87*  --   --    < > 5.80* 5.91* 6.88* 7.36* 7.49*  LATICACIDVEN  --   --  2.2* 2.3*  --   --   --   --   --   --    < > = values in this interval not displayed.    Estimated Creatinine Clearance: 10.7 mL/min (A) (by C-G formula based on SCr of 7.49 mg/dL (H)).    Allergies  Allergen Reactions   Keflex  [Cephalexin ] Anaphylaxis    Ceftriaxone  in the past with no reaction   Penicillins Anaphylaxis, Hives and Rash   Vibramycin  [Doxycycline ] Anaphylaxis   Benadryl  [Diphenhydramine ] Itching   Dilaudid  [Hydromorphone ] Itching   Methotrexate And Trimetrexate Rash   Roxicodone  [Oxycodone ] Itching    Takes Percocet without issue   Thank you for allowing pharmacy to be a part of this patient's care.  Marvetta Dauphin, PharmD, BCPS  02/24/2024 7:11 AM

## 2024-02-24 NOTE — Progress Notes (Signed)
 [facility] Progress Note   Subjective:   Seen in room, on HD at bedside. Denies SOB, CP, dizziness, nausea. Neck is sore.   Objective Vitals:   02/24/24 1000 02/24/24 1015 02/24/24 1030 02/24/24 1045  BP: (!) 154/79 (!) 143/85 (!) 177/70 (!) 177/74  Pulse: 76 76 75 76  Resp: (!) 27 (!) 22 (!) 21 (!) 28  Temp:      TempSrc:      SpO2: 94% 97% 95% 95%  Weight:      Height:       Physical Exam General: alert female in NAD. Bandage on neck Heart: RRR, no murmurs Lungs: CTA bilaterally, respirations unlabored Abdomen: Soft, non-distended, +BS Extremities: no edema b/l lower extremiteis Dialysis Access:  RUE AVG accessed  Additional Objective Labs: Basic Metabolic Panel: Recent Labs  Lab 02/22/24 1906 02/23/24 0554 02/24/24 0820  NA 133* 133* 135  K 4.5 5.2* 5.4*  CL 96* 98 100  CO2 20* 19* 19*  GLUCOSE 168* 202* 144*  BUN 56* 60* 67*  CREATININE 7.36* 7.49* 8.19*  CALCIUM  8.6* 8.7* 8.3*   Liver Function Tests: Recent Labs  Lab 02/18/24 0901 02/20/24 0650  AST 30 47*  ALT 56* 59*  ALKPHOS 559* 550*  BILITOT 0.9 2.4*  PROT 5.6* 5.6*  ALBUMIN  2.7* 3.1*   Recent Labs  Lab 02/18/24 0901 02/20/24 0935  LIPASE 20 18   CBC: Recent Labs  Lab 02/20/24 0650 02/21/24 0349 02/22/24 0452 02/23/24 0554 02/24/24 0820  WBC 15.7* 13.8* 12.7* 13.5* 13.0*  NEUTROABS 12.3*  --  8.2* 9.5* 6.7  HGB 8.8* 9.0* 9.2* 7.9* 8.3*  HCT 30.6* 28.2* 29.0* 24.8* 26.1*  MCV 95.6 84.4 84.8 84.9 85.3  PLT 333 265 328 251 252   Blood Culture    Component Value Date/Time   SDES FLUID 02/23/2024 1243   SPECREQUEST RIGHT NECK ABSCESS 02/23/2024 1243   CULT  02/23/2024 1243    ABUNDANT STAPHYLOCOCCUS AUREUS SUSCEPTIBILITIES TO FOLLOW Performed at [facility], [address] [place], [place] [address]    REPTSTATUS PENDING 02/23/2024 1243    Cardiac Enzymes: No results for input(s): CKTOTAL, CKMB, CKMBINDEX, TROPONINI in the last 168  hours. CBG: Recent Labs  Lab 02/24/24 0246 02/24/24 0414 02/24/24 0539 02/24/24 0705 02/24/24 0834  GLUCAP 212* 208* 184* 127* 132*   Iron  Studies: No results for input(s): IRON , TIBC, TRANSFERRIN, FERRITIN in the last 72 hours. @lablastinr3 @ Studies/Results: CT SOFT TISSUE NECK W CONTRAST Result Date: 02/23/2024 EXAM: CT NECK WITH CONTRAST 02/22/2024 04:08:12 PM TECHNIQUE: CT of the neck was performed with the administration of 75 mL of iohexol  (OMNIPAQUE ) 350 MG/ML injection. Multiplanar reformatted images are provided for review. Automated exposure control, iterative reconstruction, and/or weight based adjustment of the mA/kV was utilized to reduce the radiation dose to as low as reasonably achievable. COMPARISON: Neck CT without contrast earlier the same day. CLINICAL HISTORY: 33 year old female. Soft tissue infection suspected, neck, xray done. FINDINGS: AERODIGESTIVE TRACT: Small retropharyngeal space effusion is redemonstrated on series 3 image 62. No retropharyngeal space enhancement or organized fluid. Larynx and pharynx are stable and negative compared to the earlier non-contrast exam. Parapharyngeal spaces and sublingual space are stable and negative compared to the earlier non-contrast exam. SALIVARY GLANDS: Submandibular glands and parotid spaces are stable and negative compared to the earlier non-contrast exam. THYROID : Thyroid  is stable and negative compared to the earlier non-contrast exam. LYMPH NODES: Postcontrast CT confirms a rim enhancement lesion in the right posterior neck,  right level 5 lymph node station area corresponding to dominant abnormality on the non-contrast exam. This encompasses 20 x 18 x 29 mm (AP x transverse x CC), see coronal image 78. It is surrounded by hyperenhancing, rounded and enlarged lymph nodes individually measuring up to 11 mm. The lesion has internal hypodensity which appears to be complex fluid. The fluid component is roughly 10 x 11 x 19  mm. Estimated volume of the fluid component is 1 mL. Asymmetric reactive lymph nodes in that region extend up toward the right level 2 nodal station and down toward the right level 4 station. Confirmed lymphadenopathy fluid collection right posterior neck, level 5 / muscle layer. Deedra this is a suppurative lymph node lymph node abscess. SOFT TISSUES: There is regional soft tissue swelling. No soft tissue gas. BRAIN, ORBITS, SINUSES AND MASTOIDS: Chronic postoperative changes to both globes. Stable mild to moderate scattered paranasal sinus mucosal thickening. Mild right mastoid effusion. Tympanic cavities are aerated. No acute dental finding. No acute osseous abnormality. Major vascular structures in the bilateral neck and at the skull base are enhancing and patent including the right IJ. There is age advanced atherosclerosis at both carotid bifurcations and both ICA siphons. LUNGS AND MEDIASTINUM: Mild respiratory motion in the upper chest. Layering right pleural effusion is small to moderate. Patchy bilateral upper lung ground glass opacities. No apical lung consolidation. No superior mediastinal lymphadenopathy. BONES: No focal bone abnormality. No acute traumatic injury identified in the neck. IMPRESSION: 1. Positive soft tissue infection: confirmed lymphadenopathy and rim enhancing fluid collection in the right posterior neck, level 5/muscle layer. Favor a suppurative lymph node / nodal abscess (2 cm, vol 1 mL). 2. Small retropharyngeal space effusion is stable and likely reactive. Stable paranasal sinus inflammation. 3. Right lung layering effusion visible, small to moderate. 4. Age-advanced atherosclerosis at the carotid bifurcations and ICA siphons. Electronically signed by: Helayne Hurst MD 02/23/2024 04:04 AM EDT RP Workstation: [id number]   Medications:  aztreonam Stopped (02/24/24 0551)    bumetanide   10 mg Oral Daily   calcitRIOL   1.25 mcg Oral Q M,W,F   carvedilol   25 mg Oral BID WC    Chlorhexidine  Gluconate Cloth  6 each Topical Q0600   DULoxetine   20 mg Oral Daily   fluticasone   2 spray Each Nare Daily   heparin   5,000 Units Subcutaneous Q8H   hydrALAZINE   50 mg Oral Q8H   insulin  aspart  0-5 Units Subcutaneous QHS   insulin  aspart  0-9 Units Subcutaneous TID WC   insulin  glargine-yfgn  10 Units Subcutaneous Daily   lamoTRIgine   200 mg Oral Daily   linezolid   600 mg Oral Q12H   metroNIDAZOLE   500 mg Oral Q12H   OLANZapine  zydis  5 mg Oral QHS   rosuvastatin   40 mg Oral Daily   sevelamer  carbonate  800 mg Oral TID WC   sodium bicarbonate   650 mg Oral BID    Dialysis Orders: [facility]), Monday/Wednesday/Friday, 3 hours 45 minutes, BFR 450/DFR 800, right upper arm AV graft, EDW 61 kg, 2K/2.5 calcium . Heparin  1000 unit bolus and 600 units/h. Calcitriol  1 mcg q. hemodialysis with Sensipar  60 mg q. hemodialysis. Also gets Tums 1500 mg q. hemodialysis. Unclear when last dialyzed.     Assessment/Plan: 1.  Diabetic ketoacidosis: Appears to be from a combination of having missed her insulin  and what appears to be a right neck/submandibular infection.  Glycemic control improving on insulin  drip and on antibiotics/corticosteroids. 2. ESRD: Poorly adherent to  outpatient dialysis and usually on TTS schedule. Noted HD was terminated yesterday 2nd access infiltration. R AVG with (+) B/T with no erythema or swelling present- working well today. Will keep on MWF schedule 3. Hyperkalemia: See above. K+ 5.4 today.  4. Anemia: Low/stable hemoglobin and hematocrit overnight without overt blood loss.  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  Await phosphorus level with subsequent labs. 6. Nutrition: Resume renal/carb modified diet.  No evidence of dysphagia but has some odynophagia. 7. Hypertension: Blood pressure elevated, likely exacerbated by ongoing pain.  Monitor with ultrafiltration at hemodialysis. 8.  Dispo: Inpatient  Lucie Collet, PA-C 02/24/2024, 10:53 AM  [facility] Pager: [phone]

## 2024-02-24 NOTE — Progress Notes (Signed)
 Progress Note   Patient: Ashley Freeman FMW:[id number] DOB: 10-28-1990 DOA: 02/20/2024     4 DOS: the patient was seen and examined on 02/24/2024   Brief hospital course: Partly taken from prior notes.   Ashley Freeman is a 33 y.o. female with medical history significant of ESRD HD TTS, HFrEF (EF 30-35% in 08/2023), DM1, bipolar disorder, and frequent admission for DKA/volume overload p/w hyperglycemia 2/2 DKA and SOB iso volume overload 2/2 ADHF and ESRD.   Multiple prior hospitalization for similar reason.  In the ED, pt tachypneic on RA. Labs notable for VBH showing pH/CO2 of 6.96/18, Na 128, K 7.3, glucose 873, Cr 11.87, alkaline phosphatase 550, AST 47, ALT 59, T. Bili 2.4, lactate 2.2-->2.3, beta-hydroxybuterate >8, and WBC 15.7. CXR w/ volume overload, and EKG NSR.   PCCM was also consulted.  10/25: Vital Stable, improving CBG, gap still open at 19, continuing insulin  gtt. had HD last night.  Assessment and Plan: DKA (diabetic ketoacidosis) (HCC) in a patient with DM type I due to noncompliance and now appears to have a neck abscess. Secondary to not using insulin  for the past 2 days, multiple ED visits and hospitalizations for the similar reason.  History of chronic noncompliant.  Still in DKA continue insulin  drip, DKA likely worse due to neck abscess.  Mains noncompliant to ripping her IV access again and again, transition to Lantus  and subcu sliding scale, refused labs this morning as well.  Lab Results  Component Value Date   HGBA1C 10.8 (H) 12/20/2023   CBG (last 3)  Recent Labs    02/24/24 0414 02/24/24 0539 02/24/24 0705  GLUCAP 208* 184* 127*     Right sided soft tissue neck abscess/phlegmon.  Became symptomatic over the last 24 hours, blood cultures, MRSA nasal PCR, empiric IV antibiotics, discussed with ENT, IV Decadron, ENT on board and she went incision and drainage on 02/23/2024.  Follow-up postop cultures.  Far growing staph RES,  unfortunately she is pulling out her IV persistently, switch to oral Zyvox  continue other antibiotics and monitor final cultures.  ESRD with hyperkalemia.  Nephrology on board on HD sessions, noncompliant.  TTS schedule.  Lokelma  given on 02/23/2024 as she is due for a procedure.  HFrEF (heart failure with reduced ejection fraction) (HCC) History of nonischemic cardiomyopathy with EF of 30 to 35% on echo done in May 2025.  On admission significantly volume overload with missed dialysis. Volume is being managed with dialysis. Currently euvolemic  Hypertension.  Blood pressure medications adjusted on 02/24/2024.    Bipolar disorder (HCC) - Continue home olanzapine , Lamictal  and Cymbalta  -Follow-up Lamictal  levels-still pending   Subjective: Patient in bed, appears comfortable, denies any headache, no fever, no chest pain or pressure, no shortness of breath , no abdominal pain. No new focal weakness.   Physical Exam: Vitals:   02/23/24 1923 02/23/24 1924 02/24/24 0000 02/24/24 0700  BP: (!) 154/79  128/75 (!) 152/84  Pulse:      Resp: 20 16 15    Temp:  (!) 97 F (36.1 C) 98.1 F (36.7 C) 98.3 F (36.8 C)  TempSrc:  Oral Oral Oral  SpO2:   98%   Weight:      Height:       Awake Alert, No new F.N deficits, Normal affect Bynum.AT,PERRAL Right neck I&D site under bandage. Symmetrical Chest wall movement, Good air movement bilaterally, CTAB RRR,No Gallops, Rubs or new Murmurs,  +ve B.Sounds, Abd Soft, No tenderness,   No Cyanosis, Clubbing  or edema    Data Review:   Patient Lines/Drains/Airways Status     Active Line/Drains/Airways     Name Placement date Placement time Site Days   Peripheral IV 02/20/24 20 G 1.88 Left Antecubital 02/20/24  0846  Antecubital  2   Peripheral IV 02/20/24 22 G 1.75 Left;Anterior Forearm 02/20/24  0831  Forearm  2   Peripheral IV 02/21/24 22 G 2.5 Anterior;Left;Upper Arm 02/21/24  1340  Arm  1   Fistula / Graft Right Upper arm Arteriovenous vein  graft 07/06/22  1013  Upper arm  596             Inpatient Medications  Scheduled Meds:  bumetanide   10 mg Oral Daily   calcitRIOL   1.25 mcg Oral Q M,W,F   carvedilol   25 mg Oral BID WC   Chlorhexidine  Gluconate Cloth  6 each Topical Q0600   DULoxetine   20 mg Oral Daily   fluticasone   2 spray Each Nare Daily   heparin   5,000 Units Subcutaneous Q8H   insulin  aspart  0-5 Units Subcutaneous QHS   insulin  aspart  0-9 Units Subcutaneous TID WC   insulin  glargine-yfgn  10 Units Subcutaneous Daily   lamoTRIgine   200 mg Oral Daily   metroNIDAZOLE   500 mg Oral Q12H   OLANZapine  zydis  5 mg Oral QHS   rosuvastatin   40 mg Oral Daily   sevelamer  carbonate  800 mg Oral TID WC   sodium bicarbonate   650 mg Oral BID   Continuous Infusions:  aztreonam Stopped (02/24/24 0551)   PRN Meds:.dextrose , guaiFENesin -dextromethorphan , hydrALAZINE , HYDROcodone -acetaminophen , hydrOXYzine , ipratropium-albuterol , naLOXone  (NARCAN )  injection  DVT Prophylaxis  heparin  injection 5,000 Units Start: 02/20/24 1400 SCDs Start: 02/20/24 1037   Recent Labs  Lab 02/18/24 0901 02/20/24 0650 02/21/24 0349 02/22/24 0452 02/23/24 0554  WBC 15.2* 15.7* 13.8* 12.7* 13.5*  HGB 9.8* 8.8* 9.0* 9.2* 7.9*  HCT 32.1* 30.6* 28.2* 29.0* 24.8*  PLT 390 333 265 328 251  MCV 89.4 95.6 84.4 84.8 84.9  MCH 27.3 27.5 26.9 26.9 27.1  MCHC 30.5 28.8* 31.9 31.7 31.9  RDW 15.5 15.9* 15.5 15.8* 15.5  LYMPHSABS 2.5 2.3  --  3.2 3.0  MONOABS 0.8 0.9  --  0.7 0.9  EOSABS 0.7* 0.0  --  0.4 0.0  BASOSABS 0.1 0.1  --  0.1 0.0    Recent Labs  Lab 02/18/24 0901 02/18/24 0903 02/20/24 0650 02/20/24 0720 02/20/24 1009 02/20/24 1455 02/21/24 0349 02/21/24 0743 02/22/24 0452 02/22/24 1906 02/23/24 0554  NA 136  --  128*  --   --    < > 134* 135 132* 133* 133*  K 5.1  --  7.3*  --   --    < > 4.6 4.3 5.0 4.5 5.2*  CL 102  --  91*  --   --    < > 96* 96* 96* 96* 98  CO2 19*  --  <7*  --   --    < > 19* 20* 19* 20*  19*  ANIONGAP 15  --  NOT CALCULATED  --   --    < > 19* 19* 17* 17* 16*  GLUCOSE 165*  --  873*  --   --    < > 179* 119* 195* 168* 202*  BUN 86*  --  107*  --   --    < > 44* 46* 52* 56* 60*  CREATININE 10.45*  --  11.87*  --   --    < >  5.80* 5.91* 6.88* 7.36* 7.49*  AST 30  --  47*  --   --   --   --   --   --   --   --   ALT 56*  --  59*  --   --   --   --   --   --   --   --   ALKPHOS 559*  --  550*  --   --   --   --   --   --   --   --   BILITOT 0.9  --  2.4*  --   --   --   --   --   --   --   --   ALBUMIN  2.7*  --  3.1*  --   --   --   --   --   --   --   --   CRP  --   --   --   --   --   --   --   --   --  4.2* 3.6*  PROCALCITON  --   --   --   --   --   --   --   --   --  7.39 6.76  LATICACIDVEN  --   --   --  2.2* 2.3*  --   --   --   --   --   --   INR  --   --   --   --   --   --   --   --   --  1.2  --   MG  --  3.2*  --   --   --   --   --   --   --   --  2.4  CALCIUM  8.3*  --  8.6*  --   --    < > 8.3* 8.6* 8.4* 8.6* 8.7*   < > = values in this interval not displayed.      Recent Labs  Lab 02/18/24 0903 02/20/24 0650 02/20/24 0720 02/20/24 1009 02/20/24 1455 02/21/24 0349 02/21/24 0743 02/22/24 0452 02/22/24 1906 02/23/24 0554  CRP  --   --   --   --   --   --   --   --  4.2* 3.6*  PROCALCITON  --   --   --   --   --   --   --   --  7.39 6.76  LATICACIDVEN  --   --  2.2* 2.3*  --   --   --   --   --   --   INR  --   --   --   --   --   --   --   --  1.2  --   MG 3.2*  --   --   --   --   --   --   --   --  2.4  CALCIUM   --    < >  --   --    < > 8.3* 8.6* 8.4* 8.6* 8.7*   < > = values in this interval not displayed.    --------------------------------------------------------------------------------------------------------------- Lab Results  Component Value Date   CHOL 235 (H) 05/24/2021   HDL 54.70 05/24/2021   LDLCALC 149 (H) 05/24/2021   TRIG 157.0 (H) 05/24/2021   CHOLHDL 4 05/24/2021    Lab Results  Component Value Date   HGBA1C 10.8 (H)  12/20/2023    Micro Results Recent Results (from the past 240 hours)  Resp panel by RT-PCR (RSV, Flu A&B, Covid) Anterior Nasal Swab     Status: None   Collection Time: 02/18/24  7:41 AM   Specimen: Anterior Nasal Swab  Result Value Ref Range Status   SARS Coronavirus 2 by RT PCR NEGATIVE NEGATIVE Final   Influenza A by PCR NEGATIVE NEGATIVE Final   Influenza B by PCR NEGATIVE NEGATIVE Final    Comment: (NOTE) The Xpert Xpress SARS-CoV-2/FLU/RSV plus assay is intended as an aid in the diagnosis of influenza from Nasopharyngeal swab specimens and should not be used as a sole basis for treatment. Nasal washings and aspirates are unacceptable for Xpert Xpress SARS-CoV-2/FLU/RSV testing.  Fact Sheet for Patients: bloggercourse.com  Fact Sheet for Healthcare Providers: seriousbroker.it  This test is not yet approved or cleared by the [place]  FDA and has been authorized for detection and/or diagnosis of SARS-CoV-2 by FDA under an Emergency Use Authorization (EUA). This EUA will remain in effect (meaning this test can be used) for the duration of the COVID-19 declaration under Section 564(b)(1) of the Act, 21 U.S.C. section 360bbb-3(b)(1), unless the authorization is terminated or revoked.     Resp Syncytial Virus by PCR NEGATIVE NEGATIVE Final    Comment: (NOTE) Fact Sheet for Patients: bloggercourse.com  Fact Sheet for Healthcare Providers: seriousbroker.it  This test is not yet approved or cleared by the [place]  FDA and has been authorized for detection and/or diagnosis of SARS-CoV-2 by FDA under an Emergency Use Authorization (EUA). This EUA will remain in effect (meaning this test can be used) for the duration of the COVID-19 declaration under Section 564(b)(1) of the Act, 21 U.S.C. section 360bbb-3(b)(1), unless the authorization is terminated  or revoked.  Performed at [facility], [name] [place], [place] [address]   Culture, blood (Routine X 2) w Reflex to ID Panel     Status: None (Preliminary result)   Collection Time: 02/22/24  7:11 PM   Specimen: BLOOD LEFT HAND  Result Value Ref Range Status   Specimen Description BLOOD LEFT HAND  Final   Special Requests   Final    BOTTLES DRAWN AEROBIC AND ANAEROBIC Blood Culture results may not be optimal due to an inadequate volume of blood received in culture bottles   Culture   Final    NO GROWTH < 24 HOURS Performed at [facility], [address] [place], [place] [address]    Report Status PENDING  Incomplete  Culture, blood (Routine X 2) w Reflex to ID Panel     Status: None (Preliminary result)   Collection Time: 02/22/24  7:14 PM   Specimen: BLOOD  Result Value Ref Range Status   Specimen Description BLOOD BLOOD LEFT HAND  Final   Special Requests   Final    BOTTLES DRAWN AEROBIC AND ANAEROBIC Blood Culture adequate volume   Culture   Final    NO GROWTH < 24 HOURS Performed at [facility], [address], [place], [place] [address]    Report Status PENDING  Incomplete  Aerobic/Anaerobic Culture w Gram Stain (surgical/deep wound)     Status: None (Preliminary result)   Collection Time: 02/23/24 12:43 PM   Specimen: Path fluid; Body Fluid  Result Value Ref Range Status   Specimen Description FLUID  Final   Special Requests RIGHT NECK ABSCESS  Final  Gram Stain RARE WBC SEEN ABUNDANT GRAM POSITIVE COCCI   Final   Culture   Final    ABUNDANT STAPHYLOCOCCUS AUREUS SUSCEPTIBILITIES TO FOLLOW Performed at [facility], [address] [place], [place] [address]    Report Status PENDING  Incomplete    Radiology Reports  CT SOFT TISSUE NECK W CONTRAST Result Date: 02/23/2024 EXAM: CT NECK WITH CONTRAST 02/22/2024 04:08:12 PM TECHNIQUE: CT of the neck was performed with the administration of 75 mL of iohexol  (OMNIPAQUE ) 350  MG/ML injection. Multiplanar reformatted images are provided for review. Automated exposure control, iterative reconstruction, and/or weight based adjustment of the mA/kV was utilized to reduce the radiation dose to as low as reasonably achievable. COMPARISON: Neck CT without contrast earlier the same day. CLINICAL HISTORY: 33 year old female. Soft tissue infection suspected, neck, xray done. FINDINGS: AERODIGESTIVE TRACT: Small retropharyngeal space effusion is redemonstrated on series 3 image 62. No retropharyngeal space enhancement or organized fluid. Larynx and pharynx are stable and negative compared to the earlier non-contrast exam. Parapharyngeal spaces and sublingual space are stable and negative compared to the earlier non-contrast exam. SALIVARY GLANDS: Submandibular glands and parotid spaces are stable and negative compared to the earlier non-contrast exam. THYROID : Thyroid  is stable and negative compared to the earlier non-contrast exam. LYMPH NODES: Postcontrast CT confirms a rim enhancement lesion in the right posterior neck, right level 5 lymph node station area corresponding to dominant abnormality on the non-contrast exam. This encompasses 20 x 18 x 29 mm (AP x transverse x CC), see coronal image 78. It is surrounded by hyperenhancing, rounded and enlarged lymph nodes individually measuring up to 11 mm. The lesion has internal hypodensity which appears to be complex fluid. The fluid component is roughly 10 x 11 x 19 mm. Estimated volume of the fluid component is 1 mL. Asymmetric reactive lymph nodes in that region extend up toward the right level 2 nodal station and down toward the right level 4 station. Confirmed lymphadenopathy fluid collection right posterior neck, level 5 / muscle layer. Deedra this is a suppurative lymph node lymph node abscess. SOFT TISSUES: There is regional soft tissue swelling. No soft tissue gas. BRAIN, ORBITS, SINUSES AND MASTOIDS: Chronic postoperative changes to both  globes. Stable mild to moderate scattered paranasal sinus mucosal thickening. Mild right mastoid effusion. Tympanic cavities are aerated. No acute dental finding. No acute osseous abnormality. Major vascular structures in the bilateral neck and at the skull base are enhancing and patent including the right IJ. There is age advanced atherosclerosis at both carotid bifurcations and both ICA siphons. LUNGS AND MEDIASTINUM: Mild respiratory motion in the upper chest. Layering right pleural effusion is small to moderate. Patchy bilateral upper lung ground glass opacities. No apical lung consolidation. No superior mediastinal lymphadenopathy. BONES: No focal bone abnormality. No acute traumatic injury identified in the neck. IMPRESSION: 1. Positive soft tissue infection: confirmed lymphadenopathy and rim enhancing fluid collection in the right posterior neck, level 5/muscle layer. Favor a suppurative lymph node / nodal abscess (2 cm, vol 1 mL). 2. Small retropharyngeal space effusion is stable and likely reactive. Stable paranasal sinus inflammation. 3. Right lung layering effusion visible, small to moderate. 4. Age-advanced atherosclerosis at the carotid bifurcations and ICA siphons. Electronically signed by: Helayne Hurst MD 02/23/2024 04:04 AM EDT RP Workstation: [id number]   CT SOFT TISSUE NECK WO CONTRAST Result Date: 02/22/2024 EXAM: CT NECK WITHOUT CONTRAST 02/22/2024 08:31:39 AM TECHNIQUE: CT of the neck was performed without the administration of intravenous contrast.  Multiplanar reformatted images are provided for review. Automated exposure control, iterative reconstruction, and/or weight based adjustment of the mA/kV was utilized to reduce the radiation dose to as low as reasonably achievable. COMPARISON: Chest radiograph 02/22/2024. CLINICAL HISTORY: 33 year old female. Soft tissue infection suspected, neck, xray done. End stage renal disease. FINDINGS: AERODIGESTIVE TRACT: Retropharyngeal or prevertebral  space edema is mild (series 3, image 62). Laryngeal and pharyngeal soft tissue contours are within normal limits. No discrete mass. SALIVARY GLANDS: The parotid and submandibular glands are unremarkable. Negative noncontrast sublingual space. THYROID : Unremarkable. LYMPH NODES: Surrounding the phlegmon-like soft tissue mass in the right posterior neck musculature, asymmetric level 5, level 2b, and level 3b lymph nodes measure up to 12 mm short axis individually (series 3, images 48 through 56). Enlarged and reactive appearing right level 4 and supraclavicular lymph nodes also measure up to 14 mm short axis (series 3, image 85). SOFT TISSUES: Abnormal indistinct phlegmon-like soft tissue mass and spiculation right posterior neck musculature, trapezius, best seen on sagittal image 45. This is in an area of about 2.3 cm. No soft tissue gas. BRAIN, ORBITS, SINUSES AND MASTOIDS: Age advanced calcified atherosclerosis at the bilateral skull base. Mild to moderate bilateral paranasal sinus mucosal thickening. Trace right mastoid effusion. Tympanic cavities well aerated. No acute dental finding. LUNGS AND MEDIASTINUM: Layering right pleural effusion in the lung apex. Mild respiratory motion and atelectatic changes in the visible upper lungs and airways. Tortuous proximal great vessels. BONES: No osseous abnormality identified. VASCULATURE: Vascular patency not evaluated in the absence of IV contrast. Age advanced bilateral carotid and calcified atherosclerosis in the neck. Age advanced calcified atherosclerosis at the bilateral skull base. IMPRESSION: 1. non-contrast CT appearance strongly suggestive of Phlegmonous soft tissue infection in the right posterior neck musculature (trapezius) measuring approximately 2.3 cm. No organized or drainable fluid collection is evident. Correlation with focused Ultrasound there may be valuable. 2. Mild retropharyngeal or prevertebral space edema, and regional lymphadenopathy which tracks  inferiorly to the right supraclavicular fossa. 3. Mild to moderate bilateral paranasal sinus inflammation. Mild right mastoid effusion. 4. Layering right pleural effusion in the apex. Electronically signed by: Helayne Hurst MD 02/22/2024 08:49 AM EDT RP Workstation: [id number]      Signature  -   Lavada Stank M.D on 02/24/2024 at 7:49 AM   -  To page go to [email or web address]

## 2024-02-24 NOTE — Progress Notes (Signed)
 ENT note: Patient reports pain is some better. No fevers. Labs pending. On exam, there is a fair amount of purulence on the dressing, with expected induration and tenderness with penrose drain in place.  33 yo with DM now with right neck abscess: - f/u Cx - Recommend continuing abx - Penrose drain to stay in place - recommend CRP and CBC tomorrow.  Page ENT with questions

## 2024-02-25 DIAGNOSIS — E1010 Type 1 diabetes mellitus with ketoacidosis without coma: Secondary | ICD-10-CM | POA: Diagnosis not present

## 2024-02-25 LAB — CBC WITH DIFFERENTIAL/PLATELET
Abs Immature Granulocytes: 0.11 K/uL — ABNORMAL HIGH (ref 0.00–0.07)
Basophils Absolute: 0 K/uL (ref 0.0–0.1)
Basophils Relative: 0 %
Eosinophils Absolute: 0.4 K/uL (ref 0.0–0.5)
Eosinophils Relative: 4 %
HCT: 27.8 % — ABNORMAL LOW (ref 36.0–46.0)
Hemoglobin: 8.3 g/dL — ABNORMAL LOW (ref 12.0–15.0)
Immature Granulocytes: 1 %
Lymphocytes Relative: 25 %
Lymphs Abs: 2.5 K/uL (ref 0.7–4.0)
MCH: 26.8 pg (ref 26.0–34.0)
MCHC: 29.9 g/dL — ABNORMAL LOW (ref 30.0–36.0)
MCV: 89.7 fL (ref 80.0–100.0)
Monocytes Absolute: 0.5 K/uL (ref 0.1–1.0)
Monocytes Relative: 5 %
Neutro Abs: 6.5 K/uL (ref 1.7–7.7)
Neutrophils Relative %: 65 %
Platelets: 231 K/uL (ref 150–400)
RBC: 3.1 MIL/uL — ABNORMAL LOW (ref 3.87–5.11)
RDW: 15.6 % — ABNORMAL HIGH (ref 11.5–15.5)
WBC: 9.9 K/uL (ref 4.0–10.5)
nRBC: 0 % (ref 0.0–0.2)

## 2024-02-25 LAB — BASIC METABOLIC PANEL WITH GFR
Anion gap: 14 (ref 5–15)
BUN: 42 mg/dL — ABNORMAL HIGH (ref 6–20)
CO2: 22 mmol/L (ref 22–32)
Calcium: 8.1 mg/dL — ABNORMAL LOW (ref 8.9–10.3)
Chloride: 99 mmol/L (ref 98–111)
Creatinine, Ser: 5.81 mg/dL — ABNORMAL HIGH (ref 0.44–1.00)
GFR, Estimated: 9 mL/min — ABNORMAL LOW (ref >60)
Glucose, Bld: 272 mg/dL — ABNORMAL HIGH (ref 70–99)
Potassium: 4.8 mmol/L (ref 3.5–5.1)
Sodium: 135 mmol/L (ref 135–145)

## 2024-02-25 LAB — GLUCOSE, CAPILLARY
Glucose-Capillary: 242 mg/dL — ABNORMAL HIGH (ref 70–99)
Glucose-Capillary: 264 mg/dL — ABNORMAL HIGH (ref 70–99)
Glucose-Capillary: 293 mg/dL — ABNORMAL HIGH (ref 70–99)
Glucose-Capillary: 204 mg/dL — ABNORMAL HIGH (ref 70–99)

## 2024-02-25 LAB — BETA-HYDROXYBUTYRIC ACID
Beta-Hydroxybutyric Acid: 1.18 mmol/L — ABNORMAL HIGH (ref 0.05–0.27)
Beta-Hydroxybutyric Acid: 0.08 mmol/L (ref 0.05–0.27)

## 2024-02-25 LAB — C-REACTIVE PROTEIN: CRP: 3.2 mg/dL — ABNORMAL HIGH (ref <1.0)

## 2024-02-25 LAB — MAGNESIUM: Magnesium: 2.2 mg/dL (ref 1.7–2.4)

## 2024-02-25 MED ORDER — CHLORHEXIDINE GLUCONATE CLOTH 2 % EX PADS
6.0000 | MEDICATED_PAD | Freq: Every day | CUTANEOUS | Status: DC
  Administered 2024-02-27: 6 via TOPICAL

## 2024-02-25 MED ORDER — HYDROCODONE-ACETAMINOPHEN 5-325 MG PO TABS
1.0000 | ORAL_TABLET | Freq: Four times a day (QID) | ORAL | Status: DC | PRN
Start: 2024-02-25 — End: 2024-02-28
  Administered 2024-02-25 – 2024-02-28 (×5): 2 via ORAL
  Filled 2024-02-25: qty 2
  Filled 2024-02-25: qty 1
  Filled 2024-02-25: qty 2
  Filled 2024-02-25: qty 1
  Filled 2024-02-25 (×2): qty 2

## 2024-02-25 NOTE — Inpatient Diabetes Management (Signed)
 Inpatient Diabetes Program Recommendations  AACE/ADA: New Consensus Statement on Inpatient Glycemic Control (2015)  Target Ranges:  Prepandial:   less than 140 mg/dL      Peak postprandial:   less than 180 mg/dL (1-2 hours)      Critically ill patients:  140 - 180 mg/dL   Lab Results  Component Value Date   GLUCAP 264 (H) 02/25/2024   HGBA1C 10.8 (H) 12/20/2023    Review of Glycemic Control  Diabetes history: DM1 Outpatient Diabetes medications: Lantus  10 daily, Novolog  5 units TID with meals, 1 unit for every 50 mg/dL above 869 mg/dL. Current orders for Inpatient glycemic control: Semglee  10 daily, Novolog  0-9 TID with meals and 0-5 HS  Inpatient Diabetes Program Recommendations:    If post-prandials elevated, consider adding Novolog  2-3 units TID if pt eating > 50% meal.   Follow.  Thank you. Shona Brandy, RD, LDN, CDCES Inpatient Diabetes Coordinator [phone]

## 2024-02-25 NOTE — Progress Notes (Signed)
 [facility] Progress Note   Subjective:   Sleeping, opens eyes briefly to voice but not very interactive today.   Objective Vitals:   02/24/24 1953 02/25/24 0651 02/25/24 0700 02/25/24 1100  BP: (!) 144/79 (!) 173/89 (!) 150/96 (!) 154/88  Pulse:      Resp: 19     Temp: 98.1 F (36.7 C)  98.3 F (36.8 C) 98.4 F (36.9 C)  TempSrc: Oral  Oral Oral  SpO2: 96%     Weight:      Height:       Physical Exam  General: NAD. Bandage on neck Heart: RRR, no murmurs Lungs: CTA bilaterally, respirations unlabored Abdomen: Soft, non-distended, +BS Extremities: no edema b/l lower extremiteis Dialysis Access:  RUE AVG +t/b    Additional Objective Labs: Basic Metabolic Panel: Recent Labs  Lab 02/23/24 0554 02/24/24 0820 02/25/24 0624  NA 133* 135 135  K 5.2* 5.4* 4.8  CL 98 100 99  CO2 19* 19* 22  GLUCOSE 202* 144* 272*  BUN 60* 67* 42*  CREATININE 7.49* 8.19* 5.81*  CALCIUM  8.7* 8.3* 8.1*   Liver Function Tests: Recent Labs  Lab 02/20/24 0650  AST 47*  ALT 59*  ALKPHOS 550*  BILITOT 2.4*  PROT 5.6*  ALBUMIN  3.1*   Recent Labs  Lab 02/20/24 0935  LIPASE 18   CBC: Recent Labs  Lab 02/21/24 0349 02/22/24 0452 02/23/24 0554 02/24/24 0820 02/25/24 0958  WBC 13.8* 12.7* 13.5* 13.0* 9.9  NEUTROABS  --  8.2* 9.5* 6.7 6.5  HGB 9.0* 9.2* 7.9* 8.3* 8.3*  HCT 28.2* 29.0* 24.8* 26.1* 27.8*  MCV 84.4 84.8 84.9 85.3 89.7  PLT 265 328 251 252 231   Blood Culture    Component Value Date/Time   SDES FLUID 02/23/2024 1243   SPECREQUEST RIGHT NECK ABSCESS 02/23/2024 1243   CULT  02/23/2024 1243    ABUNDANT STAPHYLOCOCCUS AUREUS NO ANAEROBES ISOLATED; CULTURE IN PROGRESS FOR 5 DAYS    REPTSTATUS PENDING 02/23/2024 1243    Cardiac Enzymes: No results for input(s): CKTOTAL, CKMB, CKMBINDEX, TROPONINI in the last 168 hours. CBG: Recent Labs  Lab 02/24/24 1132 02/24/24 1613 02/24/24 2105 02/25/24 0705 02/25/24 1203  GLUCAP 132* 161*  198* 242* 264*   Iron  Studies: No results for input(s): IRON , TIBC, TRANSFERRIN, FERRITIN in the last 72 hours. @lablastinr3 @ Studies/Results: No results found. Medications:   bumetanide   10 mg Oral Daily   calcitRIOL   1.25 mcg Oral Q M,W,F   carvedilol   25 mg Oral BID WC   Chlorhexidine  Gluconate Cloth  6 each Topical Q0600   DULoxetine   20 mg Oral Daily   fluticasone   2 spray Each Nare Daily   heparin   5,000 Units Subcutaneous Q8H   hydrALAZINE   50 mg Oral Q8H   insulin  aspart  0-5 Units Subcutaneous QHS   insulin  aspart  0-9 Units Subcutaneous TID WC   insulin  glargine-yfgn  10 Units Subcutaneous Daily   lamoTRIgine   200 mg Oral Daily   linezolid   600 mg Oral Q12H   OLANZapine  zydis  5 mg Oral QHS   rosuvastatin   40 mg Oral Daily   sevelamer  carbonate  800 mg Oral TID WC   sodium bicarbonate   650 mg Oral BID    Dialysis Orders: [facility]), Monday/Wednesday/Friday, 3 hours 45 minutes, BFR 450/DFR 800, right upper arm AV graft, EDW 61 kg, 2K/2.5 calcium . Heparin  1000 unit bolus and 600 units/h. Calcitriol  1 mcg q. hemodialysis with Sensipar  60 mg q.  hemodialysis. Also gets Tums 1500 mg q. hemodialysis. Unclear when last dialyzed.   Assessment/Plan: 1.  Diabetic ketoacidosis: Appears to be from a combination of having missed her insulin  and what appears to be a right neck/submandibular infection. Improved, per PMD 2. ESRD: Poorly adherent to outpatient dialysis and usually on TTS schedule. Noted HD was terminated once due to access infiltration. R AVG with (+) B/T with no erythema or swelling present- worked well yesterday.  3. Hyperkalemia: Resolved, K+ 4.8 4. Anemia: Low/stable hemoglobin and hematocrit overnight without overt blood loss.  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  Await phosphorus level with subsequent labs. 6. Nutrition: Resume renal/carb modified diet.  No  evidence of dysphagia but has some odynophagia. 7. Hypertension: Blood pressure elevated, likely exacerbated by ongoing pain.  Monitor with ultrafiltration at hemodialysis. 8. Dispo: Inpatient     Lucie Collet, PA-C 02/25/2024, 12:30 PM  [facility] Pager: [phone]

## 2024-02-25 NOTE — Plan of Care (Signed)

## 2024-02-25 NOTE — Plan of Care (Signed)
   Problem: Education: Goal: Knowledge of General Education information will improve Description: Including pain rating scale, medication(s)/side effects and non-pharmacologic comfort measures Outcome: Progressing   Problem: Health Behavior/Discharge Planning: Goal: Ability to manage health-related needs will improve Outcome: Progressing   Problem: Clinical Measurements: Goal: Will remain free from infection Outcome: Progressing   Problem: Activity: Goal: Risk for activity intolerance will decrease Outcome: Progressing   Problem: Nutrition: Goal: Adequate nutrition will be maintained Outcome: Progressing

## 2024-02-25 NOTE — Progress Notes (Signed)
 Progress Note   Patient: Ashley Freeman FMW:[id number] DOB: 1991-03-03 DOA: 02/20/2024     5 DOS: the patient was seen and examined on 02/25/2024   Brief hospital course: Partly taken from prior notes.   Ashley Freeman is a 33 y.o. female with medical history significant of ESRD HD TTS, HFrEF (EF 30-35% in 08/2023), DM1, bipolar disorder, and frequent admission for DKA/volume overload p/w hyperglycemia 2/2 DKA and SOB iso volume overload 2/2 ADHF and ESRD.  She has had multiple prior admissions for noncompliance with dialysis and insulin , she was admitted to ICU DKA resolved transferred to our care under hospitalist service on 02/21/2024, on that day she was found to have right-sided neck abscess for which she was taken to the OR by ENT and underwent incision and drainage.     Assessment and Plan:  DKA (diabetic ketoacidosis) (HCC) in a patient with DM type I due to noncompliance and now appears to have a neck abscess. Secondary to not using insulin  for the past 2 days, multiple ED visits and hospitalizations for the similar reason.  History of chronic noncompliant.  Counseled on compliance, still pulling out IVs which makes giving IV insulin  and fluids difficult, counseled multiple times.  DKA is clinically resolved, beta hydroxybutyrate stable, she does have a small anion gap and metabolic acidosis due to underlying ESRD, on subcu insulin  now.  Lab Results  Component Value Date   HGBA1C 10.8 (H) 12/20/2023   CBG (last 3)  Recent Labs    02/24/24 1613 02/24/24 2105 02/25/24 0705  GLUCAP 161* 198* 242*     Right sided soft tissue neck abscess/phlegmon.  Became symptomatic over the last 24 hours, blood cultures, MRSA nasal PCR, empiric IV antibiotics, discussed with ENT, IV Decadron, ENT on board and she went incision and drainage on 02/23/2024, has a small Penrose drain and bandage over the neck now.  Follow-up postop cultures.  Prelim cultures growing Staph  aureus, transition to Zyvox .  Monitor final culture and sensitivity.  ESRD with hyperkalemia.  Nephrology on board on HD sessions, uncompliant in the outpatient setting again extensively counseled.  HFrEF (heart failure with reduced ejection fraction) (HCC) History of nonischemic cardiomyopathy with EF of 30 to 35% on echo done in May 2025.  On admission significantly volume overload with missed dialysis. Volume is being managed with dialysis. Currently euvolemic  Hypertension.  Blood pressure medications adjusted on 02/24/2024.    Bipolar disorder (HCC) - Continue home olanzapine , Lamictal  and Cymbalta  -Follow-up Lamictal  levels-still pending   Subjective: Seen in bed appears to be in no distress denies any headache or chest pain, right-sided neck pain better but still there.  No shortness of breath or abdominal pain.  No focal weakness   Physical Exam: Vitals:   02/24/24 1614 02/24/24 1953 02/25/24 0651 02/25/24 0700  BP: (!) 157/81 (!) 144/79 (!) 173/89 (!) 150/96  Pulse:      Resp:  19    Temp: (!) 97.5 F (36.4 C) 98.1 F (36.7 C)  98.3 F (36.8 C)  TempSrc: Oral Oral  Oral  SpO2:  96%    Weight:      Height:       Awake Alert, No new F.N deficits, Normal affect [place].AT,PERRAL Right neck I&D site under bandage. Symmetrical Chest wall movement, Good air movement bilaterally, CTAB RRR,No Gallops, Rubs or new Murmurs,  +ve B.Sounds, Abd Soft, No tenderness,   No Cyanosis, Clubbing or edema    Data Review:   Patient Lines/Drains/Airways  Status     Active Line/Drains/Airways     Name Placement date Placement time Site Days   Peripheral IV 02/20/24 20 G 1.88 Left Antecubital 02/20/24  0846  Antecubital  2   Peripheral IV 02/20/24 22 G 1.75 Left;Anterior Forearm 02/20/24  0831  Forearm  2   Peripheral IV 02/21/24 22 G 2.5 Anterior;Left;Upper Arm 02/21/24  1340  Arm  1   Fistula / Graft Right Upper arm Arteriovenous vein graft 07/06/22  1013  Upper arm  596              Inpatient Medications  Scheduled Meds:  bumetanide   10 mg Oral Daily   calcitRIOL   1.25 mcg Oral Q M,W,F   carvedilol   25 mg Oral BID WC   Chlorhexidine  Gluconate Cloth  6 each Topical Q0600   DULoxetine   20 mg Oral Daily   fluticasone   2 spray Each Nare Daily   heparin   5,000 Units Subcutaneous Q8H   hydrALAZINE   50 mg Oral Q8H   insulin  aspart  0-5 Units Subcutaneous QHS   insulin  aspart  0-9 Units Subcutaneous TID WC   insulin  glargine-yfgn  10 Units Subcutaneous Daily   lamoTRIgine   200 mg Oral Daily   linezolid   600 mg Oral Q12H   OLANZapine  zydis  5 mg Oral QHS   rosuvastatin   40 mg Oral Daily   sevelamer  carbonate  800 mg Oral TID WC   sodium bicarbonate   650 mg Oral BID   Continuous Infusions:   PRN Meds:.alteplase , dextrose , guaiFENesin -dextromethorphan , heparin , hydrALAZINE , HYDROcodone -acetaminophen , hydrOXYzine , ipratropium-albuterol , lidocaine  (PF), lidocaine -prilocaine , naLOXone  (NARCAN )  injection, pentafluoroprop-tetrafluoroeth  DVT Prophylaxis  heparin  injection 5,000 Units Start: 02/20/24 1400 SCDs Start: 02/20/24 1037   Recent Labs  Lab 02/18/24 0901 02/20/24 0650 02/21/24 0349 02/22/24 0452 02/23/24 0554 02/24/24 0820  WBC 15.2* 15.7* 13.8* 12.7* 13.5* 13.0*  HGB 9.8* 8.8* 9.0* 9.2* 7.9* 8.3*  HCT 32.1* 30.6* 28.2* 29.0* 24.8* 26.1*  PLT 390 333 265 328 251 252  MCV 89.4 95.6 84.4 84.8 84.9 85.3  MCH 27.3 27.5 26.9 26.9 27.1 27.1  MCHC 30.5 28.8* 31.9 31.7 31.9 31.8  RDW 15.5 15.9* 15.5 15.8* 15.5 15.9*  LYMPHSABS 2.5 2.3  --  3.2 3.0 5.1*  MONOABS 0.8 0.9  --  0.7 0.9 0.7  EOSABS 0.7* 0.0  --  0.4 0.0 0.3  BASOSABS 0.1 0.1  --  0.1 0.0 0.0    Recent Labs  Lab 02/18/24 0901 02/18/24 0903 02/20/24 0650 02/20/24 0720 02/20/24 1009 02/20/24 1455 02/22/24 0452 02/22/24 1906 02/23/24 0554 02/24/24 0820 02/25/24 0624  NA 136  --  128*  --   --    < > 132* 133* 133* 135 135  K 5.1  --  7.3*  --   --    < > 5.0 4.5 5.2* 5.4*  4.8  CL 102  --  91*  --   --    < > 96* 96* 98 100 99  CO2 19*  --  <7*  --   --    < > 19* 20* 19* 19* 22  ANIONGAP 15  --  NOT CALCULATED  --   --    < > 17* 17* 16* 16* 14  GLUCOSE 165*  --  873*  --   --    < > 195* 168* 202* 144* 272*  BUN 86*  --  107*  --   --    < > 52* 56* 60* 67* 42*  CREATININE 10.45*  --  11.87*  --   --    < > 6.88* 7.36* 7.49* 8.19* 5.81*  AST 30  --  47*  --   --   --   --   --   --   --   --   ALT 56*  --  59*  --   --   --   --   --   --   --   --   ALKPHOS 559*  --  550*  --   --   --   --   --   --   --   --   BILITOT 0.9  --  2.4*  --   --   --   --   --   --   --   --   ALBUMIN  2.7*  --  3.1*  --   --   --   --   --   --   --   --   CRP  --   --   --   --   --   --   --  4.2* 3.6* 3.1* 3.2*  PROCALCITON  --   --   --   --   --   --   --  7.39 6.76 5.90  --   LATICACIDVEN  --   --   --  2.2* 2.3*  --   --   --   --   --   --   INR  --   --   --   --   --   --   --  1.2  --   --   --   MG  --  3.2*  --   --   --   --   --   --  2.4 2.4 2.2  CALCIUM  8.3*  --  8.6*  --   --    < > 8.4* 8.6* 8.7* 8.3* 8.1*   < > = values in this interval not displayed.      Recent Labs  Lab 02/18/24 0903 02/20/24 0650 02/20/24 0720 02/20/24 1009 02/20/24 1455 02/22/24 0452 02/22/24 1906 02/23/24 0554 02/24/24 0820 02/25/24 0624  CRP  --   --   --   --   --   --  4.2* 3.6* 3.1* 3.2*  PROCALCITON  --   --   --   --   --   --  7.39 6.76 5.90  --   LATICACIDVEN  --   --  2.2* 2.3*  --   --   --   --   --   --   INR  --   --   --   --   --   --  1.2  --   --   --   MG 3.2*  --   --   --   --   --   --  2.4 2.4 2.2  CALCIUM   --    < >  --   --    < > 8.4* 8.6* 8.7* 8.3* 8.1*   < > = values in this interval not displayed.    --------------------------------------------------------------------------------------------------------------- Lab Results  Component Value Date   CHOL 235 (H) 05/24/2021   HDL 54.70 05/24/2021   LDLCALC 149 (H) 05/24/2021   TRIG 157.0  (H) 05/24/2021   CHOLHDL 4 05/24/2021    Lab Results  Component Value Date  HGBA1C 10.8 (H) 12/20/2023    Micro Results Recent Results (from the past 240 hours)  Resp panel by RT-PCR (RSV, Flu A&B, Covid) Anterior Nasal Swab     Status: None   Collection Time: 02/18/24  7:41 AM   Specimen: Anterior Nasal Swab  Result Value Ref Range Status   SARS Coronavirus 2 by RT PCR NEGATIVE NEGATIVE Final   Influenza A by PCR NEGATIVE NEGATIVE Final   Influenza B by PCR NEGATIVE NEGATIVE Final    Comment: (NOTE) The Xpert Xpress SARS-CoV-2/FLU/RSV plus assay is intended as an aid in the diagnosis of influenza from Nasopharyngeal swab specimens and should not be used as a sole basis for treatment. Nasal washings and aspirates are unacceptable for Xpert Xpress SARS-CoV-2/FLU/RSV testing.  Fact Sheet for Patients: bloggercourse.com  Fact Sheet for Healthcare Providers: seriousbroker.it  This test is not yet approved or cleared by the [place]  FDA and has been authorized for detection and/or diagnosis of SARS-CoV-2 by FDA under an Emergency Use Authorization (EUA). This EUA will remain in effect (meaning this test can be used) for the duration of the COVID-19 declaration under Section 564(b)(1) of the Act, 21 U.S.C. section 360bbb-3(b)(1), unless the authorization is terminated or revoked.     Resp Syncytial Virus by PCR NEGATIVE NEGATIVE Final    Comment: (NOTE) Fact Sheet for Patients: bloggercourse.com  Fact Sheet for Healthcare Providers: seriousbroker.it  This test is not yet approved or cleared by the [place]  FDA and has been authorized for detection and/or diagnosis of SARS-CoV-2 by FDA under an Emergency Use Authorization (EUA). This EUA will remain in effect (meaning this test can be used) for the duration of the COVID-19 declaration under Section 564(b)(1) of the  Act, 21 U.S.C. section 360bbb-3(b)(1), unless the authorization is terminated or revoked.  Performed at [facility], [address] [place], [place] [address]   Culture, blood (Routine X 2) w Reflex to ID Panel     Status: None (Preliminary result)   Collection Time: 02/22/24  7:11 PM   Specimen: BLOOD LEFT HAND  Result Value Ref Range Status   Specimen Description BLOOD LEFT HAND  Final   Special Requests   Final    BOTTLES DRAWN AEROBIC AND ANAEROBIC Blood Culture results may not be optimal due to an inadequate volume of blood received in culture bottles   Culture   Final    NO GROWTH 2 DAYS Performed at [facility], [address], [place], [place] [address]    Report Status PENDING  Incomplete  Culture, blood (Routine X 2) w Reflex to ID Panel     Status: None (Preliminary result)   Collection Time: 02/22/24  7:14 PM   Specimen: BLOOD  Result Value Ref Range Status   Specimen Description BLOOD BLOOD LEFT HAND  Final   Special Requests   Final    BOTTLES DRAWN AEROBIC AND ANAEROBIC Blood Culture adequate volume   Culture   Final    NO GROWTH 2 DAYS Performed at [facility], [address], [place], [place] [address]    Report Status PENDING  Incomplete  Aerobic/Anaerobic Culture w Gram Stain (surgical/deep wound)     Status: None (Preliminary result)   Collection Time: 02/23/24 12:43 PM   Specimen: Path fluid; Body Fluid  Result Value Ref Range Status   Specimen Description FLUID  Final   Special Requests RIGHT NECK ABSCESS  Final   Gram Stain RARE WBC SEEN ABUNDANT GRAM  POSITIVE COCCI   Final   Culture   Final    ABUNDANT STAPHYLOCOCCUS AUREUS SUSCEPTIBILITIES TO FOLLOW Performed at [facility], [address], [place], [place] [address]    Report Status PENDING  Incomplete    Radiology Reports  No results found.     Signature  -   Lavada Stank M.D on 02/25/2024 at 8:01 AM   -  To page go to [email or web address]

## 2024-02-26 DIAGNOSIS — E1010 Type 1 diabetes mellitus with ketoacidosis without coma: Secondary | ICD-10-CM | POA: Diagnosis not present

## 2024-02-26 LAB — CBC WITH DIFFERENTIAL/PLATELET
Abs Immature Granulocytes: 0.08 K/uL — ABNORMAL HIGH (ref 0.00–0.07)
Basophils Absolute: 0 K/uL (ref 0.0–0.1)
Basophils Relative: 0 %
Eosinophils Absolute: 0.6 K/uL — ABNORMAL HIGH (ref 0.0–0.5)
Eosinophils Relative: 6 %
HCT: 29.3 % — ABNORMAL LOW (ref 36.0–46.0)
Hemoglobin: 8.7 g/dL — ABNORMAL LOW (ref 12.0–15.0)
Immature Granulocytes: 1 %
Lymphocytes Relative: 30 %
Lymphs Abs: 2.9 K/uL (ref 0.7–4.0)
MCH: 26.6 pg (ref 26.0–34.0)
MCHC: 29.7 g/dL — ABNORMAL LOW (ref 30.0–36.0)
MCV: 89.6 fL (ref 80.0–100.0)
Monocytes Absolute: 0.6 K/uL (ref 0.1–1.0)
Monocytes Relative: 6 %
Neutro Abs: 5.7 K/uL (ref 1.7–7.7)
Neutrophils Relative %: 57 %
Platelets: 257 K/uL (ref 150–400)
RBC: 3.27 MIL/uL — ABNORMAL LOW (ref 3.87–5.11)
RDW: 15.5 % (ref 11.5–15.5)
WBC: 9.8 K/uL (ref 4.0–10.5)
nRBC: 0 % (ref 0.0–0.2)

## 2024-02-26 LAB — BASIC METABOLIC PANEL WITH GFR
Anion gap: 14 (ref 5–15)
BUN: 50 mg/dL — ABNORMAL HIGH (ref 6–20)
CO2: 22 mmol/L (ref 22–32)
Calcium: 8.4 mg/dL — ABNORMAL LOW (ref 8.9–10.3)
Chloride: 102 mmol/L (ref 98–111)
Creatinine, Ser: 6.79 mg/dL — ABNORMAL HIGH (ref 0.44–1.00)
GFR, Estimated: 8 mL/min — ABNORMAL LOW (ref >60)
Glucose, Bld: 113 mg/dL — ABNORMAL HIGH (ref 70–99)
Potassium: 4.3 mmol/L (ref 3.5–5.1)
Sodium: 138 mmol/L (ref 135–145)

## 2024-02-26 LAB — MAGNESIUM: Magnesium: 2.5 mg/dL — ABNORMAL HIGH (ref 1.7–2.4)

## 2024-02-26 LAB — GLUCOSE, CAPILLARY
Glucose-Capillary: 104 mg/dL — ABNORMAL HIGH (ref 70–99)
Glucose-Capillary: 197 mg/dL — ABNORMAL HIGH (ref 70–99)
Glucose-Capillary: 162 mg/dL — ABNORMAL HIGH (ref 70–99)
Glucose-Capillary: 190 mg/dL — ABNORMAL HIGH (ref 70–99)

## 2024-02-26 LAB — PROCALCITONIN: Procalcitonin: 4.36 ng/mL

## 2024-02-26 LAB — C-REACTIVE PROTEIN: CRP: 2.2 mg/dL — ABNORMAL HIGH (ref <1.0)

## 2024-02-26 MED ORDER — INSULIN ASPART 100 UNIT/ML IJ SOLN
2.0000 [IU] | Freq: Three times a day (TID) | INTRAMUSCULAR | Status: DC
  Administered 2024-02-26 – 2024-02-28 (×2): 2 [IU] via SUBCUTANEOUS

## 2024-02-26 MED ORDER — DARBEPOETIN ALFA 60 MCG/0.3ML IJ SOSY
60.0000 ug | PREFILLED_SYRINGE | INTRAMUSCULAR | Status: DC
  Filled 2024-02-26: qty 0.3

## 2024-02-26 MED ORDER — CEFAZOLIN SODIUM-DEXTROSE 2-4 GM/100ML-% IV SOLN: 2.0000 g | INTRAVENOUS | Status: DC

## 2024-02-26 MED ORDER — HYDRALAZINE HCL 20 MG/ML IJ SOLN
INTRAMUSCULAR | Status: AC
  Filled 2024-02-26: qty 1

## 2024-02-26 NOTE — Progress Notes (Signed)
 Progress Note   Patient: Ashley Freeman FMW:[id number] DOB: 1991-04-30 DOA: 02/20/2024     6 DOS: the patient was seen and examined on 02/26/2024   Brief hospital course: Partly taken from prior notes.   Ashley Freeman is a 33 y.o. female with medical history significant of ESRD HD TTS, HFrEF (EF 30-35% in 08/2023), DM1, bipolar disorder, and frequent admission for DKA/volume overload p/w hyperglycemia 2/2 DKA and SOB iso volume overload 2/2 ADHF and ESRD.  She has had multiple prior admissions for noncompliance with dialysis and insulin , she was admitted to ICU DKA resolved transferred to our care under hospitalist service on 02/21/2024, on that day she was found to have right-sided neck abscess for which she was taken to the OR by ENT and underwent incision and drainage.     Assessment and Plan:  DKA (diabetic ketoacidosis) (HCC) in a patient with DM type I due to noncompliance and now appears to have a neck abscess. Secondary to not using insulin  for the past 2 days, multiple ED visits and hospitalizations for the similar reason.  History of chronic noncompliant.  Counseled on compliance, still pulling out IVs which makes giving IV insulin  and fluids difficult, counseled multiple times.  DKA is clinically resolved, beta hydroxybutyrate stable, she does have a small anion gap and metabolic acidosis due to underlying ESRD, on subcu insulin  now. - Will add NovoLog  2 units before meals.  Lab Results  Component Value Date   HGBA1C 10.8 (H) 12/20/2023   CBG (last 3)  Recent Labs    02/25/24 2023 02/26/24 0724 02/26/24 1155  GLUCAP 204* 104* 197*     Right sided soft tissue neck abscess/phlegmon.  - .ENT input greatly appreciated, status post I&D 02/23/2024 . - Per ENT still with discharge, continue with Penrose today . - Intraoperative culture growing pansensitive Staph aureus . - Continue with Zyvox    ESRD with hyperkalemia.  - Nephrology on board on HD  sessions, uncompliant in the outpatient setting again extensively counseled. - Next HD is for today. - Hyperkalemia resolved with hemodialysis  HFrEF (heart failure with reduced ejection fraction) (HCC) History of nonischemic cardiomyopathy with EF of 30 to 35% on echo done in May 2025.  On admission significantly volume overload with missed dialysis. Volume is being managed with dialysis. Currently euvolemic  Hypertension.   -Blood pressure medications adjusted on 02/24/2024.   - Improved with hemodialysis  Bipolar disorder (HCC) - Continue home olanzapine , Lamictal  and Cymbalta  -Follow-up Lamictal  levels-still pending   Subjective:  reports she is feeling better, no significant events overnight, overall her pain is controlled, she is afebrile.   Physical Exam: Vitals:   02/26/24 0017 02/26/24 0423 02/26/24 0725 02/26/24 1154  BP: 120/70 (!) 156/81 139/77 (!) 165/84  Pulse:   74 83  Resp: 18 (!) 22 16 18   Temp: 98.1 F (36.7 C) 98.1 F (36.7 C) 98 F (36.7 C) 98.3 F (36.8 C)  TempSrc: Oral Oral Oral Oral  SpO2: 97% 97%    Weight:      Height:       Awake Alert, Oriented X 3 Neck bandaged.  Penrose present with small amount of purulent discharge on gauze Symmetrical Chest wall movement, Good air movement bilaterally RRR,No Gallops,Rubs or new Murmurs, No Parasternal Heave +ve B.Sounds, Abd Soft No Cyanosis, Clubbing or edema    Data Review:   Patient Lines/Drains/Airways Status     Active Line/Drains/Airways     Name Placement date Placement time Site Days  Peripheral IV 02/20/24 20 G 1.88 Left Antecubital 02/20/24  0846  Antecubital  2   Peripheral IV 02/20/24 22 G 1.75 Left;Anterior Forearm 02/20/24  0831  Forearm  2   Peripheral IV 02/21/24 22 G 2.5 Anterior;Left;Upper Arm 02/21/24  1340  Arm  1   Fistula / Graft Right Upper arm Arteriovenous vein graft 07/06/22  1013  Upper arm  596             Inpatient Medications  Scheduled Meds:   bumetanide   10 mg Oral Daily   calcitRIOL   1.25 mcg Oral Q M,W,F   carvedilol   25 mg Oral BID WC   Chlorhexidine  Gluconate Cloth  6 each Topical Q0600   darbepoetin (ARANESP) injection - DIALYSIS  60 mcg Subcutaneous Q Wed-1800   DULoxetine   20 mg Oral Daily   fluticasone   2 spray Each Nare Daily   heparin   5,000 Units Subcutaneous Q8H   hydrALAZINE   50 mg Oral Q8H   insulin  aspart  0-5 Units Subcutaneous QHS   insulin  aspart  0-9 Units Subcutaneous TID WC   insulin  glargine-yfgn  10 Units Subcutaneous Daily   lamoTRIgine   200 mg Oral Daily   linezolid   600 mg Oral Q12H   OLANZapine  zydis  5 mg Oral QHS   rosuvastatin   40 mg Oral Daily   sevelamer  carbonate  800 mg Oral TID WC   sodium bicarbonate   650 mg Oral BID   Continuous Infusions:   PRN Meds:.alteplase , dextrose , guaiFENesin -dextromethorphan , heparin , hydrALAZINE , HYDROcodone -acetaminophen , hydrOXYzine , ipratropium-albuterol , lidocaine  (PF), lidocaine -prilocaine , naLOXone  (NARCAN )  injection, pentafluoroprop-tetrafluoroeth  DVT Prophylaxis  heparin  injection 5,000 Units Start: 02/20/24 1400 SCDs Start: 02/20/24 1037   Recent Labs  Lab 02/22/24 0452 02/23/24 0554 02/24/24 0820 02/25/24 0958 02/26/24 0830  WBC 12.7* 13.5* 13.0* 9.9 9.8  HGB 9.2* 7.9* 8.3* 8.3* 8.7*  HCT 29.0* 24.8* 26.1* 27.8* 29.3*  PLT 328 251 252 231 257  MCV 84.8 84.9 85.3 89.7 89.6  MCH 26.9 27.1 27.1 26.8 26.6  MCHC 31.7 31.9 31.8 29.9* 29.7*  RDW 15.8* 15.5 15.9* 15.6* 15.5  LYMPHSABS 3.2 3.0 5.1* 2.5 2.9  MONOABS 0.7 0.9 0.7 0.5 0.6  EOSABS 0.4 0.0 0.3 0.4 0.6*  BASOSABS 0.1 0.0 0.0 0.0 0.0    Recent Labs  Lab 02/20/24 0650 02/20/24 0720 02/20/24 1009 02/20/24 1455 02/22/24 1906 02/23/24 0554 02/24/24 0820 02/25/24 0624 02/26/24 0830  NA 128*  --   --    < > 133* 133* 135 135 138  K 7.3*  --   --    < > 4.5 5.2* 5.4* 4.8 4.3  CL 91*  --   --    < > 96* 98 100 99 102  CO2 <7*  --   --    < > 20* 19* 19* 22 22  ANIONGAP NOT  CALCULATED  --   --    < > 17* 16* 16* 14 14  GLUCOSE 873*  --   --    < > 168* 202* 144* 272* 113*  BUN 107*  --   --    < > 56* 60* 67* 42* 50*  CREATININE 11.87*  --   --    < > 7.36* 7.49* 8.19* 5.81* 6.79*  AST 47*  --   --   --   --   --   --   --   --   ALT 59*  --   --   --   --   --   --   --   --  ALKPHOS 550*  --   --   --   --   --   --   --   --   BILITOT 2.4*  --   --   --   --   --   --   --   --   ALBUMIN  3.1*  --   --   --   --   --   --   --   --   CRP  --   --   --   --  4.2* 3.6* 3.1* 3.2* 2.2*  PROCALCITON  --   --   --   --  7.39 6.76 5.90  --  4.36  LATICACIDVEN  --  2.2* 2.3*  --   --   --   --   --   --   INR  --   --   --   --  1.2  --   --   --   --   MG  --   --   --   --   --  2.4 2.4 2.2 2.5*  CALCIUM  8.6*  --   --    < > 8.6* 8.7* 8.3* 8.1* 8.4*   < > = values in this interval not displayed.      Recent Labs  Lab 02/20/24 0720 02/20/24 1009 02/20/24 1455 02/22/24 1906 02/23/24 0554 02/24/24 0820 02/25/24 0624 02/26/24 0830  CRP  --   --   --  4.2* 3.6* 3.1* 3.2* 2.2*  PROCALCITON  --   --   --  7.39 6.76 5.90  --  4.36  LATICACIDVEN 2.2* 2.3*  --   --   --   --   --   --   INR  --   --   --  1.2  --   --   --   --   MG  --   --   --   --  2.4 2.4 2.2 2.5*  CALCIUM   --   --    < > 8.6* 8.7* 8.3* 8.1* 8.4*   < > = values in this interval not displayed.    --------------------------------------------------------------------------------------------------------------- Lab Results  Component Value Date   CHOL 235 (H) 05/24/2021   HDL 54.70 05/24/2021   LDLCALC 149 (H) 05/24/2021   TRIG 157.0 (H) 05/24/2021   CHOLHDL 4 05/24/2021    Lab Results  Component Value Date   HGBA1C 10.8 (H) 12/20/2023    Micro Results Recent Results (from the past 240 hours)  Resp panel by RT-PCR (RSV, Flu A&B, Covid) Anterior Nasal Swab     Status: None   Collection Time: 02/18/24  7:41 AM   Specimen: Anterior Nasal Swab  Result Value Ref Range Status    SARS Coronavirus 2 by RT PCR NEGATIVE NEGATIVE Final   Influenza A by PCR NEGATIVE NEGATIVE Final   Influenza B by PCR NEGATIVE NEGATIVE Final    Comment: (NOTE) The Xpert Xpress SARS-CoV-2/FLU/RSV plus assay is intended as an aid in the diagnosis of influenza from Nasopharyngeal swab specimens and should not be used as a sole basis for treatment. Nasal washings and aspirates are unacceptable for Xpert Xpress SARS-CoV-2/FLU/RSV testing.  Fact Sheet for Patients: bloggercourse.com  Fact Sheet for Healthcare Providers: seriousbroker.it  This test is not yet approved or cleared by the [place]  FDA and has been authorized for detection and/or diagnosis of SARS-CoV-2 by FDA under an Emergency Use Authorization (EUA). This EUA  will remain in effect (meaning this test can be used) for the duration of the COVID-19 declaration under Section 564(b)(1) of the Act, 21 U.S.C. section 360bbb-3(b)(1), unless the authorization is terminated or revoked.     Resp Syncytial Virus by PCR NEGATIVE NEGATIVE Final    Comment: (NOTE) Fact Sheet for Patients: bloggercourse.com  Fact Sheet for Healthcare Providers: seriousbroker.it  This test is not yet approved or cleared by the [place]  FDA and has been authorized for detection and/or diagnosis of SARS-CoV-2 by FDA under an Emergency Use Authorization (EUA). This EUA will remain in effect (meaning this test can be used) for the duration of the COVID-19 declaration under Section 564(b)(1) of the Act, 21 U.S.C. section 360bbb-3(b)(1), unless the authorization is terminated or revoked.  Performed at [facility], [address], [place], [place] [address]   Culture, blood (Routine X 2) w Reflex to ID Panel     Status: None (Preliminary result)   Collection Time: 02/22/24  7:11 PM   Specimen: BLOOD LEFT HAND  Result Value Ref Range  Status   Specimen Description BLOOD LEFT HAND  Final   Special Requests   Final    BOTTLES DRAWN AEROBIC AND ANAEROBIC Blood Culture results may not be optimal due to an inadequate volume of blood received in culture bottles   Culture   Final    NO GROWTH 4 DAYS Performed at [facility], [address] [place], [place] [address]    Report Status PENDING  Incomplete  Culture, blood (Routine X 2) w Reflex to ID Panel     Status: None (Preliminary result)   Collection Time: 02/22/24  7:14 PM   Specimen: BLOOD  Result Value Ref Range Status   Specimen Description BLOOD BLOOD LEFT HAND  Final   Special Requests   Final    BOTTLES DRAWN AEROBIC AND ANAEROBIC Blood Culture adequate volume   Culture   Final    NO GROWTH 4 DAYS Performed at [facility], [address], [place], [place] [address]    Report Status PENDING  Incomplete  Aerobic/Anaerobic Culture w Gram Stain (surgical/deep wound)     Status: None (Preliminary result)   Collection Time: 02/23/24 12:43 PM   Specimen: Path fluid; Body Fluid  Result Value Ref Range Status   Specimen Description FLUID  Final   Special Requests RIGHT NECK ABSCESS  Final   Gram Stain   Final    RARE WBC SEEN ABUNDANT GRAM POSITIVE COCCI Performed at [facility], [address], [place], [place] [address]    Culture   Final    ABUNDANT STAPHYLOCOCCUS AUREUS NO ANAEROBES ISOLATED; CULTURE IN PROGRESS FOR 5 DAYS    Report Status PENDING  Incomplete   Organism ID, Bacteria STAPHYLOCOCCUS AUREUS  Final      Susceptibility   Staphylococcus aureus - MIC*    CIPROFLOXACIN  <=0.5 SENSITIVE Sensitive     ERYTHROMYCIN <=0.25 SENSITIVE Sensitive     GENTAMICIN  <=0.5 SENSITIVE Sensitive     OXACILLIN 0.5 SENSITIVE Sensitive     TETRACYCLINE <=1 SENSITIVE Sensitive     VANCOMYCIN  1 SENSITIVE Sensitive     TRIMETH /SULFA  <=10 SENSITIVE Sensitive     CLINDAMYCIN  <=0.25 SENSITIVE Sensitive     RIFAMPIN <=0.5 SENSITIVE Sensitive      Inducible Clindamycin  NEGATIVE Sensitive     LINEZOLID  2 SENSITIVE Sensitive     * ABUNDANT STAPHYLOCOCCUS AUREUS    Radiology Reports  No results found.  Signature  -   Brayton Lye M.D on 02/26/2024 at 1:16 PM   -  To page go to [email or web address]

## 2024-02-26 NOTE — Progress Notes (Signed)
 [facility] Progress Note   Subjective:   Reports she is feeling much better, neck pain is improving. Denies SOB, CP, dizziness, nausea.   Objective Vitals:   02/26/24 0017 02/26/24 0423 02/26/24 0725 02/26/24 1154  BP: 120/70 (!) 156/81 139/77 (!) 165/84  Pulse:   74 83  Resp: 18 (!) 22 16 18   Temp: 98.1 F (36.7 C) 98.1 F (36.7 C) 98 F (36.7 C) 98.3 F (36.8 C)  TempSrc: Oral Oral Oral Oral  SpO2: 97% 97%    Weight:      Height:       Physical Exam General: Alert, NAD. Bandage on neck Heart: RRR, no murmurs Lungs: CTA bilaterally, respirations unlabored Abdomen: Soft, non-distended, +BS Extremities: no edema b/l lower extremiteis Dialysis Access:  RUE AVG +t/b  Additional Objective Labs: Basic Metabolic Panel: Recent Labs  Lab 02/24/24 0820 02/25/24 0624 02/26/24 0830  NA 135 135 138  K 5.4* 4.8 4.3  CL 100 99 102  CO2 19* 22 22  GLUCOSE 144* 272* 113*  BUN 67* 42* 50*  CREATININE 8.19* 5.81* 6.79*  CALCIUM  8.3* 8.1* 8.4*   Liver Function Tests: Recent Labs  Lab 02/20/24 0650  AST 47*  ALT 59*  ALKPHOS 550*  BILITOT 2.4*  PROT 5.6*  ALBUMIN  3.1*   Recent Labs  Lab 02/20/24 0935  LIPASE 18   CBC: Recent Labs  Lab 02/22/24 0452 02/23/24 0554 02/24/24 0820 02/25/24 0958 02/26/24 0830  WBC 12.7* 13.5* 13.0* 9.9 9.8  NEUTROABS 8.2* 9.5* 6.7 6.5 5.7  HGB 9.2* 7.9* 8.3* 8.3* 8.7*  HCT 29.0* 24.8* 26.1* 27.8* 29.3*  MCV 84.8 84.9 85.3 89.7 89.6  PLT 328 251 252 231 257   Blood Culture    Component Value Date/Time   SDES FLUID 02/23/2024 1243   SPECREQUEST RIGHT NECK ABSCESS 02/23/2024 1243   CULT  02/23/2024 1243    ABUNDANT STAPHYLOCOCCUS AUREUS NO ANAEROBES ISOLATED; CULTURE IN PROGRESS FOR 5 DAYS    REPTSTATUS PENDING 02/23/2024 1243    Cardiac Enzymes: No results for input(s): CKTOTAL, CKMB, CKMBINDEX, TROPONINI in the last 168 hours. CBG: Recent Labs  Lab 02/25/24 1203 02/25/24 1553 02/25/24 2023  02/26/24 0724 02/26/24 1155  GLUCAP 264* 293* 204* 104* 197*   Iron  Studies: No results for input(s): IRON , TIBC, TRANSFERRIN, FERRITIN in the last 72 hours. @lablastinr3 @ Studies/Results: No results found. Medications:   bumetanide   10 mg Oral Daily   calcitRIOL   1.25 mcg Oral Q M,W,F   carvedilol   25 mg Oral BID WC   Chlorhexidine  Gluconate Cloth  6 each Topical Q0600   DULoxetine   20 mg Oral Daily   fluticasone   2 spray Each Nare Daily   heparin   5,000 Units Subcutaneous Q8H   hydrALAZINE   50 mg Oral Q8H   insulin  aspart  0-5 Units Subcutaneous QHS   insulin  aspart  0-9 Units Subcutaneous TID WC   insulin  glargine-yfgn  10 Units Subcutaneous Daily   lamoTRIgine   200 mg Oral Daily   linezolid   600 mg Oral Q12H   OLANZapine  zydis  5 mg Oral QHS   rosuvastatin   40 mg Oral Daily   sevelamer  carbonate  800 mg Oral TID WC   sodium bicarbonate   650 mg Oral BID    Dialysis Orders: [facility]), Monday/Wednesday/Friday, 3 hours 45 minutes, BFR 450/DFR 800, right upper arm AV graft, EDW 61 kg, 2K/2.5 calcium . Heparin  1000 unit bolus and 600 units/h. Calcitriol  1 mcg q. hemodialysis with Sensipar  60  mg q. hemodialysis. Also gets Tums 1500 mg q. hemodialysis. Unclear when last dialyzed.     Assessment/Plan: 1.  Diabetic ketoacidosis: Appears to be from a combination of having missed her insulin  and what appears to be a right neck/submandibular infection. Improved, per PMD 2. ESRD: Poorly adherent to outpatient dialysis and usually on TTS schedule. Noted HD was terminated once due to access infiltration. R AVG with (+) B/T with no erythema or swelling present- worked well last HD. HD again today.  3. Hyperkalemia: Resolved with HD 4. Anemia: Low/stable hemoglobin and hematocrit. 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  Await phosphorus level with subsequent labs. 6. Nutrition: Resume renal/carb modified diet.  No evidence of dysphagia  but has some odynophagia. 7. Hypertension: Blood pressure intermittently elevated but improved.  Monitor with ultrafiltration at hemodialysis. 8. Dispo: Inpatient  Lucie Collet, PA-C 02/26/2024, 12:03 PM  [facility] Pager: [phone]

## 2024-02-26 NOTE — Progress Notes (Signed)
 ENT note: Patient reports pain is some better. On exam, there is some amount of purulence on the dressing, with expected induration and tenderness with penrose drain in place. Doing some better  33 yo with DM now with right neck abscess: - Cx - pansensitive staph aureus - Recommend continuing abx -- agree based on cultures - Penrose drain to stay in place today, consider removing tomorrow - recommend CRP and CBC tomorrow.  Page ENT with questions

## 2024-02-26 NOTE — TOC Initial Note (Signed)
 Transition of Care [facility]) - Initial/Assessment Note    Patient Details  Name: Ashley Freeman MRN: [id number] Date of Birth: [birth year]  [facility]) CM/SW Contact:    Marval Gell, RN Phone Number: 02/26/2024, 8:16 AM  Clinical Narrative:                  Very noncompliant patient admitted with DKA and Right side soft tissue neck abscess.  Chronic noncompliance with DM medications and HD.  Per chart review 10/9- Patient states she lives with her mother and her daughter. Independent with her care. Not, employed, on disability. Has a  RW at home. Does not drive, patient's aunt or medicaid transportation transports to and from outpatient HD.  PCP is [name], [name], MD and uses Psychologist, Forensic at [employer]. Expected Discharge Plan: Home/Self Care Barriers to Discharge: Continued Medical Work up   Patient Goals and CMS Choice            Expected Discharge Plan and Services       Living arrangements for the past 2 months: Apartment                 DME Arranged: N/A           HH Agency: NA        Prior Living Arrangements/Services Living arrangements for the past 2 months: Apartment Lives with:: Parents, Minor Children                   Activities of Daily Living   ADL Screening (condition at time of admission) Independently performs ADLs?: Yes (appropriate for developmental age) Is the patient deaf or have difficulty hearing?: No Does the patient have difficulty seeing, even when wearing glasses/contacts?: No Does the patient have difficulty concentrating, remembering, or making decisions?: No  Permission Sought/Granted                  Emotional Assessment              Admission diagnosis:  Hyperkalemia [E87.5] DKA (diabetic ketoacidosis) (HCC) [E11.10] Altered mental status, unspecified altered mental status type [R41.82] Other hypervolemia [E87.79] Type 1 diabetes mellitus with ketoacidotic coma (HCC)  [E10.11] Patient Active Problem List   Diagnosis Date Noted   Neck abscess 02/22/2024   Sore throat 02/22/2024   HFrEF (heart failure with reduced ejection fraction) (HCC)    Chest pain 02/03/2024   Odynophagia 01/31/2024   Esophageal dysphagia 01/31/2024   Erosive esophagitis 01/31/2024   Hematemesis 01/26/2024   High anion gap metabolic acidosis 01/16/2024   Allergic rhinitis 01/16/2024   History of anemia due to chronic kidney disease 01/16/2024   Hypervolemia associated with renal insufficiency 12/19/2023   Pain and swelling of right upper extremity 12/19/2023   History of seizure disorder 11/17/2023   Hyperosmolar hyperglycemic state (HHS) (HCC) 11/03/2023   Volume overload 11/03/2023   Vulvar pain 11/03/2023   Generalized abdominal pain 08/27/2023   Elevated LFTs 07/20/2023   Cholecystitis 06/30/2023   Prolonged QT interval 06/30/2023   Bipolar disorder (HCC) 06/30/2023   Hyperglycemic crisis due to Type 1 diabetes mellitus (HCC) 05/20/2023   Acute on chronic HFrEF (heart failure with reduced ejection fraction) (HCC) 05/20/2023   Chronic combined systolic and diastolic heart failure (HCC) 05/20/2023   Generalized pain 05/20/2023   Hypoglycemia 09/08/2022   Dilated cardiomyopathy (HCC) 09/08/2022   Seizures (HCC) 09/08/2022   ESRD (end stage renal disease) (HCC) 09/08/2022   Altered mental status 09/07/2022  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 difficile diarrhea 11/15/2021   SIRS (systemic inflammatory response syndrome) (HCC) 11/06/2021   LGSIL on Pap smear of cervix 09/24/2021   DKA (diabetic ketoacidosis) (HCC) 07/26/2021   Essential hypertension 07/26/2021   N&V (nausea and vomiting) 06/20/2021   Type 1 diabetes mellitus with chronic kidney disease on  chronic dialysis (HCC) 05/26/2021   Dyslipidemia 05/26/2021   Anasarca 04/16/2021   Mild protein malnutrition 03/07/2021   DKA, type 1 (HCC) 12/08/2020   Diabetes mellitus type I (HCC) 02/08/2020   Leg edema, left 09/27/2019   Systolic ejection murmur 08/03/2019   Type 1 diabetes mellitus with hyperglycemia (HCC) 07/24/2019   Diabetic retinopathy (HCC) 07/02/2019   DM (diabetes mellitus), type 1 with renal complications (HCC) 05/21/2019   Diarrhea 05/31/2016   Diabetic neuropathy (HCC) 01/16/2016   Preop cardiovascular exam 08/24/2013   Pseudohyponatremia 08/04/2012   Hypokalemia 05/07/2012   Lactic acidosis 02/19/2012   Leukocytosis 02/19/2012   Normocytic anemia 02/19/2012   Hyperkalemia 02/19/2012   Hyperglycemia 11/06/2011   Stable proliferative diabetic retinopathy associated with type 1 diabetes mellitus (HCC) 10/13/1997   PCP:  Keven Crumbly Pap, MD Pharmacy:   [id number] [place]), [place] - [address]  ([place]) [place] [address] Phone: [phone] Fax: [phone]  [facility] [address] [place] [address] Phone: [phone] Fax: [phone]     Social Drivers of Health (SDOH) Social History: SDOH Screenings   Food Insecurity: Patient Unable To Answer (02/21/2024)  Housing: Patient Unable To Answer (02/21/2024)  Transportation Needs: Patient Unable To Answer (02/21/2024)  Recent Concern: Transportation Needs - Unmet Transportation Needs (12/22/2023)  Utilities: Patient Unable To Answer (02/21/2024)  Depression (PHQ2-9): Medium Risk (03/02/2020)  Financial Resource Strain: Medium Risk (11/15/2023)   [facility]  Physical Activity: Insufficiently Active (03/02/2022)   Received from [facility]  Social Connections: Moderately Integrated (02/04/2024)  Stress: No Stress Concern Present (03/08/2023)   Received from [facility]  Tobacco Use: Low Risk   (02/20/2024)   SDOH Interventions:     Readmission Risk Interventions    02/06/2024    2:01 PM 11/28/2023   11:49 AM 10/29/2023   10:23 AM  Readmission Risk Prevention Plan  Transportation Screening Complete Complete Complete  Medication Review (RN Care Manager) Referral to Pharmacy Complete Complete  PCP or Specialist appointment within 3-5 days of discharge Complete Complete Complete  HRI or Home Care Consult Complete Complete Complete  SW Recovery Care/Counseling Consult Complete  Complete  Palliative Care Screening Not Applicable Not Applicable Not Applicable  Skilled Nursing Facility Not Applicable Not Applicable Not Applicable

## 2024-02-27 DIAGNOSIS — E1010 Type 1 diabetes mellitus with ketoacidosis without coma: Secondary | ICD-10-CM | POA: Diagnosis not present

## 2024-02-27 LAB — CBC WITH DIFFERENTIAL/PLATELET
Abs Immature Granulocytes: 0.08 K/uL — ABNORMAL HIGH (ref 0.00–0.07)
Basophils Absolute: 0 K/uL (ref 0.0–0.1)
Basophils Relative: 0 %
Eosinophils Absolute: 0.3 K/uL (ref 0.0–0.5)
Eosinophils Relative: 4 %
HCT: 28.5 % — ABNORMAL LOW (ref 36.0–46.0)
Hemoglobin: 8.7 g/dL — ABNORMAL LOW (ref 12.0–15.0)
Immature Granulocytes: 1 %
Lymphocytes Relative: 23 %
Lymphs Abs: 2 K/uL (ref 0.7–4.0)
MCH: 26.9 pg (ref 26.0–34.0)
MCHC: 30.5 g/dL (ref 30.0–36.0)
MCV: 88.2 fL (ref 80.0–100.0)
Monocytes Absolute: 0.6 K/uL (ref 0.1–1.0)
Monocytes Relative: 7 %
Neutro Abs: 5.6 K/uL (ref 1.7–7.7)
Neutrophils Relative %: 65 %
Platelets: 264 K/uL (ref 150–400)
RBC: 3.23 MIL/uL — ABNORMAL LOW (ref 3.87–5.11)
RDW: 15.6 % — ABNORMAL HIGH (ref 11.5–15.5)
WBC: 8.7 K/uL (ref 4.0–10.5)
nRBC: 0 % (ref 0.0–0.2)

## 2024-02-27 LAB — PROCALCITONIN: Procalcitonin: 3.43 ng/mL

## 2024-02-27 LAB — BASIC METABOLIC PANEL WITH GFR
Anion gap: 15 (ref 5–15)
BUN: 25 mg/dL — ABNORMAL HIGH (ref 6–20)
CO2: 26 mmol/L (ref 22–32)
Calcium: 8.5 mg/dL — ABNORMAL LOW (ref 8.9–10.3)
Chloride: 98 mmol/L (ref 98–111)
Creatinine, Ser: 4.66 mg/dL — ABNORMAL HIGH (ref 0.44–1.00)
GFR, Estimated: 12 mL/min — ABNORMAL LOW (ref >60)
Glucose, Bld: 224 mg/dL — ABNORMAL HIGH (ref 70–99)
Potassium: 4.3 mmol/L (ref 3.5–5.1)
Sodium: 139 mmol/L (ref 135–145)

## 2024-02-27 LAB — CULTURE, BLOOD (ROUTINE X 2)
Culture: NO GROWTH
Culture: NO GROWTH
Special Requests: ADEQUATE

## 2024-02-27 LAB — BETA-HYDROXYBUTYRIC ACID: Beta-Hydroxybutyric Acid: 0.3 mmol/L — ABNORMAL HIGH (ref 0.05–0.27)

## 2024-02-27 LAB — GLUCOSE, CAPILLARY
Glucose-Capillary: 201 mg/dL — ABNORMAL HIGH (ref 70–99)
Glucose-Capillary: 150 mg/dL — ABNORMAL HIGH (ref 70–99)
Glucose-Capillary: 144 mg/dL — ABNORMAL HIGH (ref 70–99)
Glucose-Capillary: 61 mg/dL — ABNORMAL LOW (ref 70–99)
Glucose-Capillary: 220 mg/dL — ABNORMAL HIGH (ref 70–99)
Glucose-Capillary: 38 mg/dL — CL (ref 70–99)
Glucose-Capillary: 43 mg/dL — CL (ref 70–99)
Glucose-Capillary: 83 mg/dL (ref 70–99)

## 2024-02-27 MED ORDER — HYDRALAZINE HCL 25 MG PO TABS: 25.0000 mg | ORAL_TABLET | Freq: Once | ORAL | Status: DC

## 2024-02-27 MED ORDER — SUMATRIPTAN SUCCINATE 50 MG PO TABS
50.0000 mg | ORAL_TABLET | ORAL | Status: AC | PRN
  Administered 2024-02-27 (×2): 50 mg via ORAL
  Filled 2024-02-27 (×3): qty 1

## 2024-02-27 MED ORDER — CEFAZOLIN SODIUM-DEXTROSE 1-4 GM/50ML-% IV SOLN
1.0000 g | Freq: Every day | INTRAVENOUS | Status: DC
  Administered 2024-02-27 – 2024-02-28 (×2): 1 g via INTRAVENOUS
  Filled 2024-02-27 (×2): qty 50

## 2024-02-27 MED ORDER — HYDROMORPHONE HCL 1 MG/ML IJ SOLN
0.5000 mg | Freq: Once | INTRAMUSCULAR | Status: AC | PRN
  Administered 2024-02-27: 0.5 mg via INTRAVENOUS
  Filled 2024-02-27: qty 0.5

## 2024-02-27 MED ORDER — DEXTROSE 50 % IV SOLN
INTRAVENOUS | Status: AC
  Administered 2024-02-27: 25 mL
  Filled 2024-02-27: qty 50

## 2024-02-27 MED ORDER — PHENOL 1.4 % MT LIQD
1.0000 | OROMUCOSAL | Status: DC | PRN
  Filled 2024-02-27: qty 177

## 2024-02-27 MED ORDER — MENTHOL 3 MG MT LOZG
1.0000 | LOZENGE | OROMUCOSAL | Status: DC | PRN
  Administered 2024-02-27: 3 mg via ORAL
  Filled 2024-02-27: qty 9

## 2024-02-27 MED ORDER — HYDRALAZINE HCL 25 MG PO TABS
75.0000 mg | ORAL_TABLET | Freq: Three times a day (TID) | ORAL | Status: DC
  Administered 2024-02-27 – 2024-02-28 (×2): 75 mg via ORAL
  Filled 2024-02-27 (×3): qty 1

## 2024-02-27 MED ORDER — HYDROMORPHONE HCL 1 MG/ML IJ SOLN
0.2500 mg | Freq: Once | INTRAMUSCULAR | Status: AC
  Administered 2024-02-27: 0.25 mg via INTRAVENOUS
  Filled 2024-02-27: qty 0.5

## 2024-02-27 NOTE — Progress Notes (Signed)
 Case discussed with attending, nephrologist, and renal NP. Pt will require iv cefazolin  with HD at d/c for 2 weeks. Contacted [employer] and spoke to RN to be made aware of this info. RN advised navigator that clinic will be able to provide this abx to pt with HD. Clinic RN advised pt for possible d/c tomorrow and should resume care on Saturday if deemed stable for d/c. Will contact clinic tomorrow with update. Will assist as needed.   Randine Mungo Dialysis Navigator [phone]

## 2024-02-27 NOTE — Progress Notes (Signed)
 [facility] night cross cover note:   Home prn Imitrex  for migraine ordered x 2 doses.      Eva Pore, DO Hospitalist

## 2024-02-27 NOTE — Progress Notes (Signed)
 Progress Note   Patient: Ashley Freeman FMW:[id number] DOB: 1990/08/30 DOA: 02/20/2024     7 DOS: the patient was seen and examined on 02/27/2024   Brief hospital course: Partly taken from prior notes.   Kadin Bera [place] is a 33 y.o. female with medical history significant of ESRD HD TTS, HFrEF (EF 30-35% in 08/2023), DM1, bipolar disorder, and frequent admission for DKA/volume overload p/w hyperglycemia 2/2 DKA and SOB iso volume overload 2/2 ADHF and ESRD.  She has had multiple prior admissions for noncompliance with dialysis and insulin , she was admitted to ICU DKA resolved transferred to our care under hospitalist service on 02/21/2024, on that day she was found to have right-sided neck abscess for which she was taken to the OR by ENT and underwent incision and drainage.     Assessment and Plan:  DKA (diabetic ketoacidosis) (HCC) in a patient with DM type I due to noncompliance and now appears to have a neck abscess. Secondary to not using insulin  for the past 2 days, multiple ED visits and hospitalizations for the similar reason.  History of chronic noncompliant.  Counseled on compliance, still pulling out IVs which makes giving IV insulin  and fluids difficult, counseled multiple times.  DKA is clinically resolved, beta hydroxybutyrate stable, she does have a small anion gap and metabolic acidosis due to underlying ESRD, on subcu insulin  now. - Will add NovoLog  2 units before meals.  Lab Results  Component Value Date   HGBA1C 10.8 (H) 12/20/2023   CBG (last 3)  Recent Labs    02/26/24 2032 02/27/24 0755 02/27/24 1142  GLUCAP 190* 201* 150*     Right sided soft tissue neck abscess/phlegmon.  - .ENT input greatly appreciated, status post I&D 02/23/2024 . - Per ENT still with discharge, continue with Penrose today . - Intraoperative culture growing pansensitive Staph aureus .  He was initially on p.o. Zyvox , discussed with ID, will transition to cefazolin   post HD, she tolerated well post HD yesterday, she will need to continue total of 2 weeks from postop day 10/26, stop date11/8   ESRD with hyperkalemia.  - Nephrology on board on HD sessions, uncompliant in the outpatient setting again extensively counseled. - TTS schedule, next hemodialysis on Saturday as an outpatient - Hyperkalemia resolved with hemodialysis  HFrEF (heart failure with reduced ejection fraction) (HCC) History of nonischemic cardiomyopathy with EF of 30 to 35% on echo done in May 2025.  On admission significantly volume overload with missed dialysis. Volume is being managed with dialysis. Currently euvolemic  Hypertension.   -Blood pressure medications adjusted on 02/24/2024.   - Improved with hemodialysis, but remains elevated so we will increase hydralazine  to 75 mg 3 times daily, continue with home dose Coreg   Bipolar disorder (HCC) - Continue home olanzapine , Lamictal  and Cymbalta  -Follow-up Lamictal  levels   Subjective:  reports she is feeling better, no significant events overnight, overall her pain is controlled, she is afebrile.   Physical Exam: Vitals:   02/27/24 0432 02/27/24 0756 02/27/24 0757 02/27/24 1142  BP: (!) 166/81 (!) 176/81 (!) 176/81 (!) 156/93  Pulse: 93 86 85 82  Resp: 20 (!) 34 20   Temp:  98.7 F (37.1 C) 98.7 F (37.1 C) (!) 97.3 F (36.3 C)  TempSrc:  Oral Oral Oral  SpO2: 94% 99% 98% 97%  Weight:      Height:       Awake Alert, Oriented X 3 Neck bandaged.  Penrose present Symmetrical Chest wall movement, Good  air movement bilaterally, CTAB RRR,No Gallops,Rubs or new Murmurs, No Parasternal Heave +ve B.Sounds, Abd Soft, No tenderness, No rebound - guarding or rigidity. No Cyanosis, Clubbing or edema, No new Rash or bruise       Data Review:   Patient Lines/Drains/Airways Status     Active Line/Drains/Airways     Name Placement date Placement time Site Days   Peripheral IV 02/20/24 20 G 1.88 Left Antecubital 02/20/24   0846  Antecubital  2   Peripheral IV 02/20/24 22 G 1.75 Left;Anterior Forearm 02/20/24  0831  Forearm  2   Peripheral IV 02/21/24 22 G 2.5 Anterior;Left;Upper Arm 02/21/24  1340  Arm  1   Fistula / Graft Right Upper arm Arteriovenous vein graft 07/06/22  1013  Upper arm  596             Inpatient Medications  Scheduled Meds:  bumetanide   10 mg Oral Daily   calcitRIOL   1.25 mcg Oral Q M,W,F   carvedilol   25 mg Oral BID WC   Chlorhexidine  Gluconate Cloth  6 each Topical Q0600   darbepoetin (ARANESP) injection - DIALYSIS  60 mcg Subcutaneous Q Wed-1800   DULoxetine   20 mg Oral Daily   fluticasone   2 spray Each Nare Daily   heparin   5,000 Units Subcutaneous Q8H   hydrALAZINE   50 mg Oral Q8H   insulin  aspart  0-5 Units Subcutaneous QHS   insulin  aspart  0-9 Units Subcutaneous TID WC   insulin  aspart  2 Units Subcutaneous TID WC   insulin  glargine-yfgn  10 Units Subcutaneous Daily   lamoTRIgine   200 mg Oral Daily   OLANZapine  zydis  5 mg Oral QHS   rosuvastatin   40 mg Oral Daily   sevelamer  carbonate  800 mg Oral TID WC   sodium bicarbonate   650 mg Oral BID   Continuous Infusions:   ceFAZolin  (ANCEF ) IV 1 g (02/27/24 1327)    PRN Meds:.dextrose , guaiFENesin -dextromethorphan , hydrALAZINE , HYDROcodone -acetaminophen , hydrOXYzine , ipratropium-albuterol , naLOXone  (NARCAN )  injection  DVT Prophylaxis  heparin  injection 5,000 Units Start: 02/20/24 1400 SCDs Start: 02/20/24 1037   Recent Labs  Lab 02/23/24 0554 02/24/24 0820 02/25/24 0958 02/26/24 0830 02/27/24 0518  WBC 13.5* 13.0* 9.9 9.8 8.7  HGB 7.9* 8.3* 8.3* 8.7* 8.7*  HCT 24.8* 26.1* 27.8* 29.3* 28.5*  PLT 251 252 231 257 264  MCV 84.9 85.3 89.7 89.6 88.2  MCH 27.1 27.1 26.8 26.6 26.9  MCHC 31.9 31.8 29.9* 29.7* 30.5  RDW 15.5 15.9* 15.6* 15.5 15.6*  LYMPHSABS 3.0 5.1* 2.5 2.9 2.0  MONOABS 0.9 0.7 0.5 0.6 0.6  EOSABS 0.0 0.3 0.4 0.6* 0.3  BASOSABS 0.0 0.0 0.0 0.0 0.0    Recent Labs  Lab 02/22/24 1906  02/23/24 0554 02/24/24 0820 02/25/24 0624 02/26/24 0830 02/27/24 0518  NA 133* 133* 135 135 138 139  K 4.5 5.2* 5.4* 4.8 4.3 4.3  CL 96* 98 100 99 102 98  CO2 20* 19* 19* 22 22 26   ANIONGAP 17* 16* 16* 14 14 15   GLUCOSE 168* 202* 144* 272* 113* 224*  BUN 56* 60* 67* 42* 50* 25*  CREATININE 7.36* 7.49* 8.19* 5.81* 6.79* 4.66*  CRP 4.2* 3.6* 3.1* 3.2* 2.2*  --   PROCALCITON 7.39 6.76 5.90  --  4.36 3.43  INR 1.2  --   --   --   --   --   MG  --  2.4 2.4 2.2 2.5*  --   CALCIUM  8.6* 8.7* 8.3* 8.1* 8.4* 8.5*  Recent Labs  Lab 02/22/24 1906 02/23/24 0554 02/24/24 0820 02/25/24 0624 02/26/24 0830 02/27/24 0518  CRP 4.2* 3.6* 3.1* 3.2* 2.2*  --   PROCALCITON 7.39 6.76 5.90  --  4.36 3.43  INR 1.2  --   --   --   --   --   MG  --  2.4 2.4 2.2 2.5*  --   CALCIUM  8.6* 8.7* 8.3* 8.1* 8.4* 8.5*    --------------------------------------------------------------------------------------------------------------- Lab Results  Component Value Date   CHOL 235 (H) 05/24/2021   HDL 54.70 05/24/2021   LDLCALC 149 (H) 05/24/2021   TRIG 157.0 (H) 05/24/2021   CHOLHDL 4 05/24/2021    Lab Results  Component Value Date   HGBA1C 10.8 (H) 12/20/2023    Micro Results Recent Results (from the past 240 hours)  Resp panel by RT-PCR (RSV, Flu A&B, Covid) Anterior Nasal Swab     Status: None   Collection Time: 02/18/24  7:41 AM   Specimen: Anterior Nasal Swab  Result Value Ref Range Status   SARS Coronavirus 2 by RT PCR NEGATIVE NEGATIVE Final   Influenza A by PCR NEGATIVE NEGATIVE Final   Influenza B by PCR NEGATIVE NEGATIVE Final    Comment: (NOTE) The Xpert Xpress SARS-CoV-2/FLU/RSV plus assay is intended as an aid in the diagnosis of influenza from Nasopharyngeal swab specimens and should not be used as a sole basis for treatment. Nasal washings and aspirates are unacceptable for Xpert Xpress SARS-CoV-2/FLU/RSV testing.  Fact Sheet for  Patients: bloggercourse.com  Fact Sheet for Healthcare Providers: seriousbroker.it  This test is not yet approved or cleared by the [place]  FDA and has been authorized for detection and/or diagnosis of SARS-CoV-2 by FDA under an Emergency Use Authorization (EUA). This EUA will remain in effect (meaning this test can be used) for the duration of the COVID-19 declaration under Section 564(b)(1) of the Act, 21 U.S.C. section 360bbb-3(b)(1), unless the authorization is terminated or revoked.     Resp Syncytial Virus by PCR NEGATIVE NEGATIVE Final    Comment: (NOTE) Fact Sheet for Patients: bloggercourse.com  Fact Sheet for Healthcare Providers: seriousbroker.it  This test is not yet approved or cleared by the [place]  FDA and has been authorized for detection and/or diagnosis of SARS-CoV-2 by FDA under an Emergency Use Authorization (EUA). This EUA will remain in effect (meaning this test can be used) for the duration of the COVID-19 declaration under Section 564(b)(1) of the Act, 21 U.S.C. section 360bbb-3(b)(1), unless the authorization is terminated or revoked.  Performed at [facility], [address], [place], [place] [address]   Culture, blood (Routine X 2) w Reflex to ID Panel     Status: None   Collection Time: 02/22/24  7:11 PM   Specimen: BLOOD LEFT HAND  Result Value Ref Range Status   Specimen Description BLOOD LEFT HAND  Final   Special Requests   Final    BOTTLES DRAWN AEROBIC AND ANAEROBIC Blood Culture results may not be optimal due to an inadequate volume of blood received in culture bottles   Culture   Final    NO GROWTH 5 DAYS Performed at [facility], [address] [place], [place] [address]    Report Status 02/27/2024 FINAL  Final  Culture, blood (Routine X 2) w Reflex to ID Panel     Status: None   Collection Time: 02/22/24  7:14  PM   Specimen: BLOOD  Result Value Ref Range Status   Specimen  Description BLOOD BLOOD LEFT HAND  Final   Special Requests   Final    BOTTLES DRAWN AEROBIC AND ANAEROBIC Blood Culture adequate volume   Culture   Final    NO GROWTH 5 DAYS Performed at [facility], [address], [place], [place] [address]    Report Status 02/27/2024 FINAL  Final  Aerobic/Anaerobic Culture w Gram Stain (surgical/deep wound)     Status: None (Preliminary result)   Collection Time: 02/23/24 12:43 PM   Specimen: Path fluid; Body Fluid  Result Value Ref Range Status   Specimen Description FLUID  Final   Special Requests RIGHT NECK ABSCESS  Final   Gram Stain   Final    RARE WBC SEEN ABUNDANT GRAM POSITIVE COCCI Performed at [facility], [address] [place], [place] [address]    Culture   Final    ABUNDANT STAPHYLOCOCCUS AUREUS NO ANAEROBES ISOLATED; CULTURE IN PROGRESS FOR 5 DAYS    Report Status PENDING  Incomplete   Organism ID, Bacteria STAPHYLOCOCCUS AUREUS  Final      Susceptibility   Staphylococcus aureus - MIC*    CIPROFLOXACIN  <=0.5 SENSITIVE Sensitive     ERYTHROMYCIN <=0.25 SENSITIVE Sensitive     GENTAMICIN  <=0.5 SENSITIVE Sensitive     OXACILLIN 0.5 SENSITIVE Sensitive     TETRACYCLINE <=1 SENSITIVE Sensitive     VANCOMYCIN  1 SENSITIVE Sensitive     TRIMETH /SULFA  <=10 SENSITIVE Sensitive     CLINDAMYCIN  <=0.25 SENSITIVE Sensitive     RIFAMPIN <=0.5 SENSITIVE Sensitive     Inducible Clindamycin  NEGATIVE Sensitive     LINEZOLID  2 SENSITIVE Sensitive     * ABUNDANT STAPHYLOCOCCUS AUREUS    Radiology Reports  No results found.     Signature  -   Brayton Lye M.D on 02/27/2024 at 2:17 PM   -  To page go to [email or web address]

## 2024-02-27 NOTE — Progress Notes (Addendum)
 Pt's cbg=61. Pt refused taking PO food/drink. Administered 25ml dextrose  5%. Rechecked cbg=144. Will continue to monitor the pt.  Amado GORMAN Arabia, RN

## 2024-02-27 NOTE — Progress Notes (Addendum)
 [facility] night cross cover note:   Pt c/o sore throat. I added prn Cepacol lozenges to her existing order for prn Chloraseptic throat spray.    Update: pt c/o generalized pain, but is having difficulty with swallowing pills in the setting of her sore throat. I subsequently ordered Dilaudid  0.5 mg IV x 1 dose prn.     Eva Pore, DO Hospitalist

## 2024-02-27 NOTE — Progress Notes (Addendum)
 Pt refused taking off 2l 02 oxygen. Stated that she got panic attack. Pt wanted to keep oxygen on.  Amado GORMAN Arabia, RN

## 2024-02-27 NOTE — Progress Notes (Addendum)
 Pt complained of pain all over her body. Pain on her throat. Unable to swallow. Pt refused hydralinze, coreg , revela. Md informed  Pt's CBG=38. Pt refused to eat/drink. Administered 50ml dextrose  5%. Rechecked cbg=220. MD notified.   Amado GORMAN Arabia, RN

## 2024-02-27 NOTE — Progress Notes (Signed)
 ENT note: Doing better. Still slight purulence on the dressing, with expected induration and tenderness with penrose drain in place.  32 yo with DM now with right neck abscess: - Cx - pansensitive staph aureus - Recommend continuing abx -- agree based on cultures -  Given slight purulence, will keep penrose today and will remove tomorrow.  [facility] for discharge tomorrow once penrose removed  Page ENT with questions

## 2024-02-27 NOTE — Progress Notes (Signed)
 [facility] Progress Note   Subjective:   Denies SOB, CP, dizziness, nausea.   Objective Vitals:   02/27/24 0756 02/27/24 0757 02/27/24 1142 02/27/24 1549  BP: (!) 176/81 (!) 176/81 (!) 156/93 138/79  Pulse: 86 85 82 83  Resp: (!) 34 20    Temp: 98.7 F (37.1 C) 98.7 F (37.1 C) (!) 97.3 F (36.3 C) 98.5 F (36.9 C)  TempSrc: Oral Oral Oral Oral  SpO2: 99% 98% 97% 90%  Weight:      Height:       Physical Exam General: Alert, NAD. Bandage on neck Heart: RRR, no murmurs Lungs: CTA bilaterally, respirations unlabored Abdomen: Soft, non-distended, +BS Extremities: no edema b/l lower extremiteis Dialysis Access:  RUE AVG +t/b  Additional Objective Labs: Basic Metabolic Panel: Recent Labs  Lab 02/25/24 0624 02/26/24 0830 02/27/24 0518  NA 135 138 139  K 4.8 4.3 4.3  CL 99 102 98  CO2 22 22 26   GLUCOSE 272* 113* 224*  BUN 42* 50* 25*  CREATININE 5.81* 6.79* 4.66*  CALCIUM  8.1* 8.4* 8.5*     Dialysis Orders:  MWF [facility] [address]) 3h  B450  61kg  AVG   Heparin  1000+ 600u/hr Calcitriol  1 mcg q. HD Sensipar  60 mg q. HD Tums 1500 mg q. HD     Assessment/Plan: 1.  Diabetic ketoacidosis: Appears to be from a combination of having missed her insulin  and what appears to be a right neck/submandibular infection. Improved, per PMD 2. ESRD: usual HD is TTS schedule. Noted HD was terminated once due to access infiltration. R AVG with (+) B/T with no erythema or swelling present- worked well last HD. Had HD here Monday/ wed this week. Next HD Sat , likely at OP unit if dc'd tomorrow.  3. Hyperkalemia: Resolved with HD 4. Anemia: Low/stable hemoglobin and hematocrit. 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  Await phosphorus level with subsequent labs. 6. Nutrition: Resume renal/carb modified diet.  No evidence of dysphagia but has some odynophagia. 7. Hypertension: Blood pressure intermittently elevated but  improved.  Monitor with ultrafiltration at hemodialysis. 8. Dispo: likely is for d/c tomorrow  Myer Fret  MD  [facility] 02/27/2024, 4:36 PM  Recent Labs  Lab 02/26/24 0830 02/27/24 0518  HGB 8.7* 8.7*  CALCIUM  8.4* 8.5*  CREATININE 6.79* 4.66*  K 4.3 4.3    Inpatient medications:  bumetanide   10 mg Oral Daily   calcitRIOL   1.25 mcg Oral Q M,W,F   carvedilol   25 mg Oral BID WC   Chlorhexidine  Gluconate Cloth  6 each Topical Q0600   darbepoetin (ARANESP) injection - DIALYSIS  60 mcg Subcutaneous Q Wed-1800   DULoxetine   20 mg Oral Daily   fluticasone   2 spray Each Nare Daily   heparin   5,000 Units Subcutaneous Q8H   hydrALAZINE   25 mg Oral Once   hydrALAZINE   75 mg Oral Q8H   insulin  aspart  0-5 Units Subcutaneous QHS   insulin  aspart  0-9 Units Subcutaneous TID WC   insulin  aspart  2 Units Subcutaneous TID WC   insulin  glargine-yfgn  10 Units Subcutaneous Daily   lamoTRIgine   200 mg Oral Daily   OLANZapine  zydis  5 mg Oral QHS   rosuvastatin   40 mg Oral Daily   sevelamer  carbonate  800 mg Oral TID WC   sodium bicarbonate   650 mg Oral BID     ceFAZolin  (ANCEF ) IV Stopped (02/27/24 1357)   dextrose , guaiFENesin -dextromethorphan , hydrALAZINE ,  HYDROcodone -acetaminophen , hydrOXYzine , ipratropium-albuterol , naLOXone  (NARCAN )  injection

## 2024-02-28 ENCOUNTER — Other Ambulatory Visit (HOSPITAL_COMMUNITY): Payer: Self-pay

## 2024-02-28 DIAGNOSIS — E1010 Type 1 diabetes mellitus with ketoacidosis without coma: Secondary | ICD-10-CM | POA: Diagnosis not present

## 2024-02-28 LAB — BETA-HYDROXYBUTYRIC ACID: Beta-Hydroxybutyric Acid: 0.11 mmol/L (ref 0.05–0.27)

## 2024-02-28 LAB — AEROBIC/ANAEROBIC CULTURE W GRAM STAIN (SURGICAL/DEEP WOUND)

## 2024-02-28 LAB — GLUCOSE, CAPILLARY
Glucose-Capillary: 120 mg/dL — ABNORMAL HIGH (ref 70–99)
Glucose-Capillary: 194 mg/dL — ABNORMAL HIGH (ref 70–99)
Glucose-Capillary: 206 mg/dL — ABNORMAL HIGH (ref 70–99)
Glucose-Capillary: 258 mg/dL — ABNORMAL HIGH (ref 70–99)
Glucose-Capillary: 37 mg/dL — CL (ref 70–99)
Glucose-Capillary: 69 mg/dL — ABNORMAL LOW (ref 70–99)

## 2024-02-28 LAB — CBC
HCT: 30.5 % — ABNORMAL LOW (ref 36.0–46.0)
Hemoglobin: 9.2 g/dL — ABNORMAL LOW (ref 12.0–15.0)
MCH: 26.8 pg (ref 26.0–34.0)
MCHC: 30.2 g/dL (ref 30.0–36.0)
MCV: 88.9 fL (ref 80.0–100.0)
Platelets: 235 K/uL (ref 150–400)
RBC: 3.43 MIL/uL — ABNORMAL LOW (ref 3.87–5.11)
RDW: 15.4 % (ref 11.5–15.5)
WBC: 8.7 K/uL (ref 4.0–10.5)
nRBC: 0 % (ref 0.0–0.2)

## 2024-02-28 LAB — C-REACTIVE PROTEIN: CRP: 1.6 mg/dL — ABNORMAL HIGH (ref <1.0)

## 2024-02-28 MED ORDER — LINEZOLID 600 MG PO TABS
600.0000 mg | ORAL_TABLET | Freq: Two times a day (BID) | ORAL | 0 refills | Status: AC
Start: 2024-02-29 — End: 2024-03-04
  Filled 2024-02-28: qty 8, 4d supply, fill #0

## 2024-02-28 MED ORDER — HYDROCODONE-ACETAMINOPHEN 5-325 MG PO TABS
1.0000 | ORAL_TABLET | Freq: Four times a day (QID) | ORAL | 0 refills | Status: DC | PRN
  Filled 2024-02-28: qty 10, 2d supply, fill #0

## 2024-02-28 MED ORDER — DEXTROSE 50 % IV SOLN
12.5000 g | INTRAVENOUS | Status: AC
  Administered 2024-02-28: 12.5 g via INTRAVENOUS

## 2024-02-28 MED ORDER — DEXTROSE 10 % IV SOLN: INTRAVENOUS | Status: DC

## 2024-02-28 MED ORDER — CHLORHEXIDINE GLUCONATE CLOTH 2 % EX PADS
6.0000 | MEDICATED_PAD | Freq: Every day | CUTANEOUS | Status: DC
  Administered 2024-02-28: 6 via TOPICAL

## 2024-02-28 MED ORDER — INSULIN GLARGINE-YFGN 100 UNIT/ML ~~LOC~~ SOLN
10.0000 [IU] | Freq: Every day | SUBCUTANEOUS | Status: DC
  Administered 2024-02-28: 10 [IU] via SUBCUTANEOUS
  Filled 2024-02-28: qty 0.1

## 2024-02-28 MED ORDER — DEXTROSE 50 % IV SOLN
25.0000 g | INTRAVENOUS | Status: AC
  Administered 2024-02-28: 25 g via INTRAVENOUS

## 2024-02-28 MED ORDER — FUROSEMIDE 10 MG/ML IJ SOLN
80.0000 mg | Freq: Once | INTRAMUSCULAR | Status: AC
  Administered 2024-02-28: 80 mg via INTRAVENOUS
  Filled 2024-02-28: qty 8

## 2024-02-28 MED ORDER — CEFAZOLIN IV (FOR PTA / DISCHARGE USE ONLY): 2.0000 g | INTRAVENOUS | Status: AC

## 2024-02-28 NOTE — Progress Notes (Addendum)
 D/C order noted. Contacted [facility]. Staff advised pt for d/c today and should resume care tomorrow. Navigator advised by clinic staff that clinic has been unable to obtain iv cefazolin  as hoped and will not have iv abx for pt's treatment tomorrow. Clinic has ordered med and will hopefully be delivered early next week. This info was provided to attending and renal PA. Plan is for pt d/c on PO abx over the weekend until pt's clinic can obtain iv abx hopefully early next week. HD clinic staff made aware of this info. Navigator will fax d/c summary and most recent renal note to clinic once available for continuation of care. Pt's HD appt for tomorrow added to pt's AVS as well.   Randine Mungo Dialysis Navigator [phone]  Addendum at 3:49 pm: D/C summary and today's renal note faxed to HD clinic for continuation of care.

## 2024-02-28 NOTE — Progress Notes (Signed)
  [facility] Progress Note   Subjective:   Had low BS overnight. Reports she feels fine now, now SOB, dizziness, HA, nausea. Drain removed by ENT today.    Objective Vitals:   02/28/24 0006 02/28/24 0100 02/28/24 0511 02/28/24 0600  BP: (!) 190/97 (!) 167/81 (!) 174/85 (!) 176/86  Pulse:  84  86  Resp:  20    Temp:      TempSrc:      SpO2:  98%  96%  Weight:      Height:       Physical Exam General: Alert, NAD. Bandage on neck Heart: RRR, no murmurs Lungs: CTA bilaterally, respirations unlabored Abdomen: Soft, non-distended, +BS Extremities: no edema b/l lower extremiteis Dialysis Access:  RUE AVG +t/b  Additional Objective Labs: Basic Metabolic Panel: Recent Labs  Lab 02/25/24 0624 02/26/24 0830 02/27/24 0518  NA 135 138 139  K 4.8 4.3 4.3  CL 99 102 98  CO2 22 22 26   GLUCOSE 272* 113* 224*  BUN 42* 50* 25*  CREATININE 5.81* 6.79* 4.66*  CALCIUM  8.1* 8.4* 8.5*   Liver Function Tests: No results for input(s): AST, ALT, ALKPHOS, BILITOT, PROT, ALBUMIN  in the last 168 hours. No results for input(s): LIPASE, AMYLASE in the last 168 hours. CBC: Recent Labs  Lab 02/24/24 0820 02/25/24 0958 02/26/24 0830 02/27/24 0518 02/28/24 0800  WBC 13.0* 9.9 9.8 8.7 8.7  NEUTROABS 6.7 6.5 5.7 5.6  --   HGB 8.3* 8.3* 8.7* 8.7* 9.2*  HCT 26.1* 27.8* 29.3* 28.5* 30.5*  MCV 85.3 89.7 89.6 88.2 88.9  PLT 252 231 257 264 235   Blood Culture    Component Value Date/Time   SDES FLUID 02/23/2024 1243   SPECREQUEST RIGHT NECK ABSCESS 02/23/2024 1243   CULT  02/23/2024 1243    ABUNDANT STAPHYLOCOCCUS AUREUS NO ANAEROBES ISOLATED; CULTURE IN PROGRESS FOR 5 DAYS    REPTSTATUS PENDING 02/23/2024 1243    Cardiac Enzymes: No results for input(s): CKTOTAL, CKMB, CKMBINDEX, TROPONINI in the last 168 hours. CBG: Recent Labs  Lab 02/28/24 0012 02/28/24 0059 02/28/24 0458 02/28/24 0535 02/28/24 0759  GLUCAP 37* 120* 69* 194* 206*    Iron  Studies: No results for input(s): IRON , TIBC, TRANSFERRIN, FERRITIN in the last 72 hours. @lablastinr3 @ Studies/Results: No results found. Medications:   ceFAZolin  (ANCEF ) IV Stopped (02/27/24 1357)   dextrose  50 mL/hr at 02/28/24 0521    bumetanide   10 mg Oral Daily   calcitRIOL   1.25 mcg Oral Q M,W,F   carvedilol   25 mg Oral BID WC   Chlorhexidine  Gluconate Cloth  6 each Topical Q0600   darbepoetin (ARANESP) injection - DIALYSIS  60 mcg Subcutaneous Q Wed-1800   DULoxetine   20 mg Oral Daily   fluticasone   2 spray Each Nare Daily   heparin   5,000 Units Subcutaneous Q8H   hydrALAZINE   25 mg Oral Once   hydrALAZINE   75 mg Oral Q8H   insulin  aspart  0-5 Units Subcutaneous QHS   insulin  aspart  0-9 Units Subcutaneous TID WC   insulin  aspart  2 Units Subcutaneous TID WC   lamoTRIgine   200 mg Oral Daily   OLANZapine  zydis  5 mg Oral QHS   rosuvastatin   40 mg Oral Daily   sevelamer  carbonate  800 mg Oral TID WC   sodium bicarbonate   650 mg Oral BID    Dialysis Orders: MWF [facility] [address]) 3h  B450  61kg  AVG   Heparin  1000+ 600u/hr Calcitriol  1 mcg  q. HD Sensipar  60 mg q. HD Tums 1500 mg q. HD   Assessment/Plan: 1.  Diabetic ketoacidosis: Appears to be from a combination of having missed her insulin  and what appears to be a right neck/submandibular infection. Improved, per PMD 2. ESRD: usual HD is MWF schedule. Noted HD was terminated once due to access infiltration. R AVG with (+) B/T with no erythema or swelling present- worked well last HD. Had HD here Monday/ wed this week. Next HD today 3. Hyperkalemia: Resolved with HD 4. Anemia: Low/stable hemoglobin and hematocrit. 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   6. Nutrition: Resume renal/carb modified diet.  No evidence of dysphagia but has some odynophagia. 7. Hypertension: Blood pressure intermittently elevated but improved.  Monitor with ultrafiltration at  hemodialysis.  Lucie Collet, PA-C 02/28/2024, 10:42 AM  [facility] Pager: [phone]

## 2024-02-28 NOTE — Progress Notes (Signed)
 Pt ambulated in the hall on RA.  Lowest 02 noted to be 90%.  Pt returned to room, sitting up in the chair.  Currently on RA, 02 94%, no resp distress.  Pt encouraged to use the IS.  Report given to HD RN.  Pt updated on POC

## 2024-02-28 NOTE — Discharge Instructions (Signed)
 Follow with Primary MD [name], [name], MD in 7 days   Get CBC, CMP, 2 view Chest X ray checked  by Primary MD next visit.    Activity: As tolerated with Full fall precautions use walker/cane & assistance as needed   Disposition Home    Diet: Renal diet/carb modified diet with 1200 cc fluid restriction  On your next visit with your primary care physician please Get Medicines reviewed and adjusted.   Please request your Prim.MD to go over all Hospital Tests and Procedure/Radiological results at the follow up, please get all Hospital records sent to your Prim MD by signing hospital release before you go home.   If you experience worsening of your admission symptoms, develop shortness of breath, life threatening emergency, suicidal or homicidal thoughts you must seek medical attention immediately by calling 911 or calling your MD immediately  if symptoms less severe.  You Must read complete instructions/literature along with all the possible adverse reactions/side effects for all the Medicines you take and that have been prescribed to you. Take any new Medicines after you have completely understood and accpet all the possible adverse reactions/side effects.   Do not drive, operating heavy machinery, perform activities at heights, swimming or participation in water  activities or provide baby sitting services if your were admitted for syncope or siezures until you have seen by Primary MD or a Neurologist and advised to do so again.  Do not drive when taking Pain medications.    Do not take more than prescribed Pain, Sleep and Anxiety Medications  Special Instructions: If you have smoked or chewed Tobacco  in the last 2 yrs please stop smoking, stop any regular Alcohol  and or any Recreational drug use.  Wear Seat belts while driving.   Please note  You were cared for by a hospitalist during your hospital stay. If you have any questions about your discharge medications or the care  you received while you were in the hospital after you are discharged, you can call the unit and asked to speak with the hospitalist on call if the hospitalist that took care of you is not available. Once you are discharged, your primary care physician will handle any further medical issues. Please note that NO REFILLS for any discharge medications will be authorized once you are discharged, as it is imperative that you return to your primary care physician (or establish a relationship with a primary care physician if you do not have one) for your aftercare needs so that they can reassess your need for medications and monitor your lab values.

## 2024-02-28 NOTE — Progress Notes (Signed)
 ENT note: Doing better from ENT standpoint. Complaining of generalized pain. Penrose drain without purulence, neck is much softer and less indurated, and no pain on palpation. Removed penrose drain. Continues on abx.  33 yo with DM now with right neck abscess: - Cx - pansensitive staph aureus - Recommend continuing abx -- total 14 days - Removed penrose drain, the defect will close on its own in a few days. Ok to shower with band aid on but do not submerge incision for 7 days (like a bath). - ENT will request f/u  Ashley Freeman

## 2024-02-28 NOTE — Discharge Summary (Addendum)
 Physician Discharge Summary  Ashley Freeman FMW:[id number] DOB: [birth year] DOA: 02/20/2024  PCP: Keven Crumbly Pap, MD  Admit date: 02/20/2024 Discharge date: 02/28/2024  Admitted From: (Home) Disposition:  (Home)  Recommendations for Outpatient Follow-up:  Please continue counseling about medication and diet compliance. He is continue counseling about dialysis compliance Recommendation for IV cefazolin  post HD with stop date 03/07/2024, next dose to start after HD tomorrow 02/29/2024 (as well she was given prescription for oral Zyvox  total of 4 days she can start tomorrow after her hemodialysis if IV cefazolin  is not arranged at hemodialysis center, and she can take till IV cefazolin  is arranged, and IV cefazolin  last dose date will still be on 03/07/2024)   Home Health: (YES)  Diet recommendation: Heart Healthy / Carb Modified   Patient was counseled at length about medication compliance, diet compliance, and Elcess compliance, but she is high risk for readmission given her noncompliance, as she already had 15 admissions just over the last 6 months  Brief/Interim Summary:   Ashley Freeman is a 33 y.o. female with medical history significant of ESRD HD TTS, HFrEF (EF 30-35% in 08/2023), DM1, bipolar disorder, and frequent admission for DKA/volume overload p/w hyperglycemia 2/2 DKA and SOB iso volume overload 2/2 ADHF and ESRD.  She has had multiple prior admissions for noncompliance with dialysis and insulin , she was admitted to ICU DKA resolved transferred to our care under hospitalist service on 02/21/2024, on that day she was found to have right-sided neck abscess for which she was taken to the OR by ENT and underwent incision and drainage.   DKA (diabetic ketoacidosis) (HCC) in a patient with DM type I due to noncompliance and now appears to have a neck abscess. Secondary to not using insulin , multiple ED visits and hospitalizations for the similar reason.   History of chronic noncompliant.  Counseled on compliance, she was even pulling her IV during hospital stay, where she was not able to receive her IV insulin  - He has been compliant over the last few days, her insulin  regimen has been adjusted, no further evidence of DKA. - Reports she has enough insulin  at home and she does not need refill at time of discharge. - Her home regimen on discharge   Right sided soft tissue neck abscess/phlegmon.  - .ENT input greatly appreciated, status post I&D 02/23/2024 . -Operative cultures growing MSSA. - He was followed closely by ENT daily, Penrose was kept till this morning given some minimal purulent output, this has resolved. - Discussed with ID, recommendation for IV cefazolin  postdialysis,she will need to continue total of 2 weeks from postop day 10/26, stop date11/8 (and this can be bridged with oral Zyvox  till IV cefazolin  at the facility)     ESRD with hyperkalemia.  - Nephrology on board on HD sessions, uncompliant in the outpatient setting again extensively counseled. - TTS schedule, next hemodialysis on Saturday as an outpatient - Hyperkalemia resolved with hemodialysis   HFrEF (heart failure with reduced ejection fraction) (HCC) History of nonischemic cardiomyopathy with EF of 30 to 35% on echo done in May 2025.  On admission significantly volume overload with missed dialysis. Volume is being managed with dialysis. Currently euvolemic   Hypertension.   - Resume home regimen   Bipolar disorder (HCC) - Continue home olanzapine , Lamictal  and Cymbalta  -Follow-up Lamictal  levels       Discharge Diagnoses:  Principal Problem:   DKA (diabetic ketoacidosis) (HCC) Active Problems:   Diabetes mellitus type I (HCC)  Hyperkalemia   HFrEF (heart failure with reduced ejection fraction) (HCC)   ESRD (end stage renal disease) (HCC)   Bipolar disorder (HCC)   Neck abscess   Sore throat    Discharge Instructions  Discharge Instructions      Diet - low sodium heart healthy   Complete by: As directed    Discharge instructions   Complete by: As directed    Follow with Primary MD [name], [name], MD in 7 days   Get CBC, CMP, 2 view Chest X ray checked  by Primary MD next visit.    Activity: As tolerated with Full fall precautions use walker/cane & assistance as needed   Disposition Home    Diet: Renal diet/carb modified diet with 1200 cc fluid restriction  On your next visit with your primary care physician please Get Medicines reviewed and adjusted.   Please request your Prim.MD to go over all Hospital Tests and Procedure/Radiological results at the follow up, please get all Hospital records sent to your Prim MD by signing hospital release before you go home.   If you experience worsening of your admission symptoms, develop shortness of breath, life threatening emergency, suicidal or homicidal thoughts you must seek medical attention immediately by calling 911 or calling your MD immediately  if symptoms less severe.  You Must read complete instructions/literature along with all the possible adverse reactions/side effects for all the Medicines you take and that have been prescribed to you. Take any new Medicines after you have completely understood and accpet all the possible adverse reactions/side effects.   Do not drive, operating heavy machinery, perform activities at heights, swimming or participation in water  activities or provide baby sitting services if your were admitted for syncope or siezures until you have seen by Primary MD or a Neurologist and advised to do so again.  Do not drive when taking Pain medications.    Do not take more than prescribed Pain, Sleep and Anxiety Medications  Special Instructions: If you have smoked or chewed Tobacco  in the last 2 yrs please stop smoking, stop any regular Alcohol  and or any Recreational drug use.  Wear Seat belts while driving.   Please note  You were  cared for by a hospitalist during your hospital stay. If you have any questions about your discharge medications or the care you received while you were in the hospital after you are discharged, you can call the unit and asked to speak with the hospitalist on call if the hospitalist that took care of you is not available. Once you are discharged, your primary care physician will handle any further medical issues. Please note that NO REFILLS for any discharge medications will be authorized once you are discharged, as it is imperative that you return to your primary care physician (or establish a relationship with a primary care physician if you do not have one) for your aftercare needs so that they can reassess your need for medications and monitor your lab values.   Home infusion instructions   Complete by: As directed    Instructions: Flushing of vascular access device: 0.9% NaCl pre/post medication administration and prn patency; Heparin  100 u/ml, 5ml for implanted ports and Heparin  10u/ml, 5ml for all other central venous catheters.   Increase activity slowly   Complete by: As directed    No wound care   Complete by: As directed       Allergies as of 02/28/2024       Reactions  Keflex  [cephalexin ] Anaphylaxis   Ceftriaxone  in the past with no reaction   Penicillins Anaphylaxis, Hives, Rash   Vibramycin  [doxycycline ] Anaphylaxis   Benadryl  [diphenhydramine ] Itching   Dilaudid  [hydromorphone ] Itching   Methotrexate And Trimetrexate Rash   Roxicodone  [oxycodone ] Itching   Takes Percocet without issue        Medication List     STOP taking these medications    oxyCODONE -acetaminophen  5-325 MG tablet Commonly known as: PERCOCET/ROXICET       TAKE these medications    albuterol  108 (90 Base) MCG/ACT inhaler Commonly known as: VENTOLIN  HFA Inhale 2 puffs into the lungs every 4 (four) hours as needed for wheezing or shortness of breath.   bumetanide  2 MG tablet Commonly known  as: BUMEX  Take 10 mg by mouth daily.   calcitRIOL  0.25 MCG capsule Commonly known as: ROCALTROL  Take 5 capsules (1.25 mcg total) by mouth every Tuesday, Thursday, and Saturday at 6 PM.   carvedilol  25 MG tablet Commonly known as: COREG  Take 25 mg by mouth 2 (two) times daily with a meal.   ceFAZolin  IVPB Commonly known as: ANCEF  Inject 2 g into the vein Every Tuesday,Thursday,and Saturday with dialysis for 7 days. Indication:  MRSA abscess First Dose: Yes Last Day of Therapy:  03/07/2024 Start taking on: February 29, 2024   DULoxetine  20 MG capsule Commonly known as: CYMBALTA  Take 20 mg by mouth daily.   Entresto 24-26 MG Generic drug: sacubitril-valsartan Take 1 tablet by mouth 2 (two) times daily.   fluticasone  50 MCG/ACT nasal spray Commonly known as: FLONASE  Place 2 sprays into both nostrils daily.   HYDROcodone -acetaminophen  5-325 MG tablet Commonly known as: NORCO/VICODIN Take 1-2 tablets by mouth every 6 (six) hours as needed for moderate pain (pain score 4-6) or severe pain (pain score 7-10).   hydrOXYzine  25 MG tablet Commonly known as: ATARAX  Take 25 mg by mouth 3 (three) times daily as needed for anxiety, itching, nausea or vomiting.   ketoconazole  2 % cream Commonly known as: NIZORAL  Apply 1 Application topically 2 (two) times daily. Apply 1gm twice daily to affected skin on feet   lamoTRIgine  200 MG tablet Commonly known as: LAMICTAL  Take 1 tablet (200 mg total) by mouth daily.   Lantus  SoloStar 100 UNIT/ML Solostar Pen Generic drug: insulin  glargine Inject 10 Units into the skin daily.   linezolid  600 MG tablet Commonly known as: ZYVOX  Take 1 tablet (600 mg total) by mouth 2 (two) times daily for 4 days. Start taking on: February 29, 2024   metoCLOPramide  10 MG tablet Commonly known as: REGLAN  Take 0.5 tablets (5 mg total) by mouth every 8 (eight) hours as needed for nausea or refractory nausea / vomiting.   NovoLOG  FlexPen 100 UNIT/ML  FlexPen Generic drug: insulin  aspart INJECT 5 UNITS UNDER THE SKIN 3 TIMES A DAY BEFORE MEALS. IF YOU DO NOT EAT A MEAL, PLEASE CHEECK YOUR BLOOD SUGAR AND GIVE YOURSELF 1 UNIT OF INSULIN  FOR EVERY 50MG /DL GREATER THAN 869. MAX 30 UNITS PER DAY.   OLANZapine  zydis 5 MG disintegrating tablet Commonly known as: ZYPREXA  Take 5 mg by mouth at bedtime.   pantoprazole  40 MG tablet Commonly known as: PROTONIX  Take 1 tablet (40 mg total) by mouth 2 (two) times daily.   prochlorperazine  5 MG tablet Commonly known as: COMPAZINE  Take 5 mg by mouth every 6 (six) hours as needed for nausea or vomiting.   rosuvastatin  40 MG tablet Commonly known as: CRESTOR  Take 40 mg by mouth daily.  sevelamer  carbonate 800 MG tablet Commonly known as: RENVELA  Take 1 tablet (800 mg total) by mouth 3 (three) times daily with meals.   sodium bicarbonate  650 MG tablet Take 650 mg by mouth 2 (two) times daily.   sucralfate  1 GM/10ML suspension Commonly known as: CARAFATE  Take 10 mLs (1 g total) by mouth 2 (two) times daily for 14 days.   SUMAtriptan  50 MG tablet Commonly known as: IMITREX  Take 50 mg by mouth every 2 (two) hours as needed for migraine or headache.               Home Infusion Instuctions  (From admission, onward)           Start     Ordered   02/28/24 0000  Home infusion instructions       Question:  Instructions  Answer:  Flushing of vascular access device: 0.9% NaCl pre/post medication administration and prn patency; Heparin  100 u/ml, 5ml for implanted ports and Heparin  10u/ml, 5ml for all other central venous catheters.   02/28/24 1323            Follow-up Information     Dialysis, [facility]. Go on 02/29/2024.   Why: Schedule is Tuesday, Thursday, Saturday wtih 11:00 am start time. Contact information: [address] [place] [address] [phone]                Allergies  Allergen Reactions   Keflex  [Cephalexin ] Anaphylaxis    Ceftriaxone  in  the past with no reaction   Penicillins Anaphylaxis, Hives and Rash   Vibramycin  [Doxycycline ] Anaphylaxis   Benadryl  [Diphenhydramine ] Itching   Dilaudid  [Hydromorphone ] Itching   Methotrexate And Trimetrexate Rash   Roxicodone  [Oxycodone ] Itching    Takes Percocet without issue    Consultations: ENT PCCM Nephrology   Procedures/Studies: CT SOFT TISSUE NECK W CONTRAST Result Date: 02/23/2024 EXAM: CT NECK WITH CONTRAST 02/22/2024 04:08:12 PM TECHNIQUE: CT of the neck was performed with the administration of 75 mL of iohexol  (OMNIPAQUE ) 350 MG/ML injection. Multiplanar reformatted images are provided for review. Automated exposure control, iterative reconstruction, and/or weight based adjustment of the mA/kV was utilized to reduce the radiation dose to as low as reasonably achievable. COMPARISON: Neck CT without contrast earlier the same day. CLINICAL HISTORY: 33 year old female. Soft tissue infection suspected, neck, xray done. FINDINGS: AERODIGESTIVE TRACT: Small retropharyngeal space effusion is redemonstrated on series 3 image 62. No retropharyngeal space enhancement or organized fluid. Larynx and pharynx are stable and negative compared to the earlier non-contrast exam. Parapharyngeal spaces and sublingual space are stable and negative compared to the earlier non-contrast exam. SALIVARY GLANDS: Submandibular glands and parotid spaces are stable and negative compared to the earlier non-contrast exam. THYROID : Thyroid  is stable and negative compared to the earlier non-contrast exam. LYMPH NODES: Postcontrast CT confirms a rim enhancement lesion in the right posterior neck, right level 5 lymph node station area corresponding to dominant abnormality on the non-contrast exam. This encompasses 20 x 18 x 29 mm (AP x transverse x CC), see coronal image 78. It is surrounded by hyperenhancing, rounded and enlarged lymph nodes individually measuring up to 11 mm. The lesion has internal hypodensity which  appears to be complex fluid. The fluid component is roughly 10 x 11 x 19 mm. Estimated volume of the fluid component is 1 mL. Asymmetric reactive lymph nodes in that region extend up toward the right level 2 nodal station and down toward the right level 4 station. Confirmed lymphadenopathy fluid collection right posterior  neck, level 5 / muscle layer. Deedra this is a suppurative lymph node lymph node abscess. SOFT TISSUES: There is regional soft tissue swelling. No soft tissue gas. BRAIN, ORBITS, SINUSES AND MASTOIDS: Chronic postoperative changes to both globes. Stable mild to moderate scattered paranasal sinus mucosal thickening. Mild right mastoid effusion. Tympanic cavities are aerated. No acute dental finding. No acute osseous abnormality. Major vascular structures in the bilateral neck and at the skull base are enhancing and patent including the right IJ. There is age advanced atherosclerosis at both carotid bifurcations and both ICA siphons. LUNGS AND MEDIASTINUM: Mild respiratory motion in the upper chest. Layering right pleural effusion is small to moderate. Patchy bilateral upper lung ground glass opacities. No apical lung consolidation. No superior mediastinal lymphadenopathy. BONES: No focal bone abnormality. No acute traumatic injury identified in the neck. IMPRESSION: 1. Positive soft tissue infection: confirmed lymphadenopathy and rim enhancing fluid collection in the right posterior neck, level 5/muscle layer. Favor a suppurative lymph node / nodal abscess (2 cm, vol 1 mL). 2. Small retropharyngeal space effusion is stable and likely reactive. Stable paranasal sinus inflammation. 3. Right lung layering effusion visible, small to moderate. 4. Age-advanced atherosclerosis at the carotid bifurcations and ICA siphons. Electronically signed by: Helayne Hurst MD 02/23/2024 04:04 AM EDT RP Workstation: [id number]   CT SOFT TISSUE NECK WO CONTRAST Result Date: 02/22/2024 EXAM: CT NECK WITHOUT CONTRAST  02/22/2024 08:31:39 AM TECHNIQUE: CT of the neck was performed without the administration of intravenous contrast. Multiplanar reformatted images are provided for review. Automated exposure control, iterative reconstruction, and/or weight based adjustment of the mA/kV was utilized to reduce the radiation dose to as low as reasonably achievable. COMPARISON: Chest radiograph 02/22/2024. CLINICAL HISTORY: 33 year old female. Soft tissue infection suspected, neck, xray done. End stage renal disease. FINDINGS: AERODIGESTIVE TRACT: Retropharyngeal or prevertebral space edema is mild (series 3, image 62). Laryngeal and pharyngeal soft tissue contours are within normal limits. No discrete mass. SALIVARY GLANDS: The parotid and submandibular glands are unremarkable. Negative noncontrast sublingual space. THYROID : Unremarkable. LYMPH NODES: Surrounding the phlegmon-like soft tissue mass in the right posterior neck musculature, asymmetric level 5, level 2b, and level 3b lymph nodes measure up to 12 mm short axis individually (series 3, images 48 through 56). Enlarged and reactive appearing right level 4 and supraclavicular lymph nodes also measure up to 14 mm short axis (series 3, image 85). SOFT TISSUES: Abnormal indistinct phlegmon-like soft tissue mass and spiculation right posterior neck musculature, trapezius, best seen on sagittal image 45. This is in an area of about 2.3 cm. No soft tissue gas. BRAIN, ORBITS, SINUSES AND MASTOIDS: Age advanced calcified atherosclerosis at the bilateral skull base. Mild to moderate bilateral paranasal sinus mucosal thickening. Trace right mastoid effusion. Tympanic cavities well aerated. No acute dental finding. LUNGS AND MEDIASTINUM: Layering right pleural effusion in the lung apex. Mild respiratory motion and atelectatic changes in the visible upper lungs and airways. Tortuous proximal great vessels. BONES: No osseous abnormality identified. VASCULATURE: Vascular patency not evaluated  in the absence of IV contrast. Age advanced bilateral carotid and calcified atherosclerosis in the neck. Age advanced calcified atherosclerosis at the bilateral skull base. IMPRESSION: 1. non-contrast CT appearance strongly suggestive of Phlegmonous soft tissue infection in the right posterior neck musculature (trapezius) measuring approximately 2.3 cm. No organized or drainable fluid collection is evident. Correlation with focused Ultrasound there may be valuable. 2. Mild retropharyngeal or prevertebral space edema, and regional lymphadenopathy which tracks inferiorly to the right supraclavicular fossa. 3.  Mild to moderate bilateral paranasal sinus inflammation. Mild right mastoid effusion. 4. Layering right pleural effusion in the apex. Electronically signed by: Helayne Hurst MD 02/22/2024 08:49 AM EDT RP Workstation: [id number]   DG Shoulder 1V Right Result Date: 02/22/2024 EXAM: 1 VIEW XRAY OF THE RIGHT SHOULDER 02/22/2024 06:20:00 AM COMPARISON: Chest radiograph today. CLINICAL HISTORY: FINDINGS: BONES AND JOINTS: Glenohumeral joint is normally aligned. No acute fracture or dislocation. The [facility] joint is unremarkable in appearance. SOFT TISSUES: Vascular stent in right upper extremity. No abnormal calcifications. Visualized lung is unremarkable. IMPRESSION: 1. No osseous abnormality of the right shoulder. 2. Right axillary vascular stent in place. Electronically signed by: Helayne Hurst MD 02/22/2024 06:41 AM EDT RP Workstation: [id number]   DG Chest Port 1 View Result Date: 02/22/2024 EXAM: 1 VIEW(S) XRAY OF THE CHEST 02/22/2024 06:20:00 AM COMPARISON: 02/20/2024 CLINICAL HISTORY: SOB (shortness of breath) 141880. Reason for exam: shortness of breath; CHIEF COMPLAINTS: Hyperglycemia ; Principal Problem: ; DKA (diabetic ketoacidosis) (HCC) FINDINGS: LINES, TUBES AND DEVICES: Multiple overlying monitor wires noted. LUNGS AND PLEURA: Hypoinflated lungs. Bilateral interstitial prominence. Veiling right pleural  effusion less apparent. No focal pulmonary opacity. No overt pulmonary edema. No pneumothorax. HEART AND MEDIASTINUM: Mild cardiomegaly with central vascular prominence. BONES AND SOFT TISSUES: No acute osseous abnormality. IMPRESSION: 1. Hypoinflated lungs with interstitial prominence. Right pleural effusion less apparent. 2. No new cardiopulmonary abnormality. Electronically signed by: Helayne Hurst MD 02/22/2024 06:40 AM EDT RP Workstation: [id number]   DG Chest Portable 1 View Result Date: 02/20/2024 EXAM: 1 VIEW XRAY OF THE CHEST 02/20/2024 08:53:00 AM COMPARISON: Portable chest 02/18/2024 and earlier. CLINICAL HISTORY: 33 year old female. DKA, volume overload. History of HTN and diabetes. Not taking insulin  for 2 days, vomiting. FINDINGS: LUNGS AND PLEURA: Mildly rotated to the left. Mild new veiling right lung base opacity. Small right pleural effusion suspected. Mild linear opacity in the periphery of the left lung appears to be atelectasis. No pulmonary edema. No pneumothorax. HEART AND MEDIASTINUM: Stable cardiomegaly. BONES AND SOFT TISSUES: No acute osseous abnormality. IMPRESSION: 1. Small veiling right pleural effusion now suspected. 2. Stable cardiomegaly. Electronically signed by: Helayne Hurst MD 02/20/2024 08:58 AM EDT RP Workstation: [id number]   DG Chest Portable 1 View Result Date: 02/18/2024 EXAM: 1 VIEW(S) XRAY OF THE CHEST 02/18/2024 05:58:28 AM COMPARISON: 02/04/2024 CLINICAL HISTORY: Eval for infection/aspiration. Hypoglycemia; Seizures ,Eval for infection/aspiration ; rover FINDINGS: LINES, TUBES AND DEVICES: Right axillary stent partially visible. LUNGS AND PLEURA: Low lung volumes. No focal pulmonary opacity. No pulmonary edema. No pleural effusion. No pneumothorax. HEART AND MEDIASTINUM: Mild cardiomegaly, stable. BONES AND SOFT TISSUES: No acute osseous abnormality. IMPRESSION: 1. No acute cardiopulmonary findings. 2. Low lung volumes. Electronically signed by: Waddell Calk MD  02/18/2024 06:45 AM EDT RP Workstation: [id number]   NM Pulmonary Perfusion Result Date: 02/05/2024 EXAM: NM Lung Perfusion Scan. CLINICAL HISTORY: Pulmonary embolism (PE) suspected, high prob. Patient has been experiencing SOB and coughing. TECHNIQUE: Radiolabeled MAA was administered intravenously and planar images of the lungs were obtained in multiple projections. RADIOPHARMACEUTICAL: 3.92 mCi Tc4m MAA. COMPARISON: Radiograph 02/04/2024. FINDINGS: PERFUSION: No wedge-shaped peripheral perfusion defect within left or right lung to suggest acute pulmonary embolism. Normal perfusion pattern. IMPRESSION: 1. No perfusion defects to indicate pulmonary embolism. Electronically signed by: Norleen Boxer MD 02/05/2024 04:45 PM EDT RP Workstation: [id number]   DG CHEST PORT 1 VIEW Result Date: 02/04/2024 EXAM: 1 VIEW(S) XRAY OF THE CHEST 02/04/2024 03:55:00 PM COMPARISON: 02/03/2024 CLINICAL HISTORY: Pulmonary embolism FINDINGS: LINES, TUBES AND DEVICES:  Right axillary vascular stent noted. LUNGS AND PLEURA: Low lung volumes. Hazy bibasilar opacities. No pleural effusion. No pneumothorax. HEART AND MEDIASTINUM: Stable cardiomegaly. Unchanged mediastinal contours. BONES AND SOFT TISSUES: No acute osseous abnormality. IMPRESSION: 1. Unchanged cardiomegaly. Hazy bibasilar opacities may represent edema. Electronically signed by: Andrea Gasman MD 02/04/2024 08:15 PM EDT RP Workstation: [id number]   IR Paracentesis Result Date: 02/04/2024 INDICATION: 34 year old female. History of end-stage renal disease on hemodialysis with recurrent ascites. Request is for therapeutic paracentesis EXAM: ULTRASOUND GUIDED THERAPEUTIC PARACENTESIS MEDICATIONS: Lidocaine  1% 10 mL COMPLICATIONS: None immediate. PROCEDURE: Informed written consent was obtained from the patient after a discussion of the risks, benefits and alternatives to treatment. A timeout was performed prior to the initiation of the procedure. Initial ultrasound  scanning demonstrates a moderate amount of ascites within the right lower abdominal quadrant. The right lower abdomen was prepped and draped in the usual sterile fashion. 1% lidocaine  was used for local anesthesia. Following this, a 6 Fr Safe-T-Centesis catheter was introduced. An ultrasound image was saved for documentation purposes. The paracentesis was performed. The catheter was removed and a dressing was applied. The patient tolerated the procedure well without immediate post procedural complication. FINDINGS: A total of approximately 3.1 L of straw-colored fluid was removed. IMPRESSION: Successful ultrasound-guided right-sided paracentesis yielding 3.1 liters of straw-colored peritoneal fluid. Performed by Delon Beagle NP PLAN: If the patient eventually requires >/=2 paracenteses in a 30 day period, candidacy for formal evaluation by the [facility] will be assessed. Electronically Signed   By: Juliene Balder M.D.   On: 02/04/2024 15:39   CT ABDOMEN PELVIS WO CONTRAST Result Date: 02/03/2024 CLINICAL DATA:  Abdominal pain. EXAM: CT ABDOMEN AND PELVIS WITHOUT CONTRAST TECHNIQUE: Multidetector CT imaging of the abdomen and pelvis was performed following the standard protocol without IV contrast. RADIATION DOSE REDUCTION: This exam was performed according to the departmental dose-optimization program which includes automated exposure control, adjustment of the mA and/or kV according to patient size and/or use of iterative reconstruction technique. COMPARISON:  01/15/2024. FINDINGS: Lower chest: Septal thickening and ground-glass in the lung bases. Small to moderate right pleural effusion. Heart is enlarged. Trace pericardial fluid may be physiologic. Distal esophagus is grossly unremarkable. Hepatobiliary: Liver is enlarged, 21.4 cm. Liver is otherwise grossly unremarkable. Cholecystectomy. No biliary ductal dilatation. Pancreas: Grossly unremarkable.  Spleen: Negative. Adrenals/Urinary Tract: Adrenal glands and kidneys are grossly unremarkable. Ureters are decompressed. Bladder is grossly unremarkable. Stomach/Bowel: Stomach, small bowel, appendix and colon are unremarkable. Vascular/Lymphatic: Vascular structures are unremarkable. Scattered abdominal peritoneal ligament and retroperitoneal lymph nodes are not enlarged by CT size criteria. Reproductive: Uterus is visualized.  No adnexal mass. Other: Large ascites.  Omental congestion/edema. Musculoskeletal: None. IMPRESSION: 1. Large ascites. 2. Congestive heart failure. 3. Hepatomegaly. Electronically Signed   By: Newell Eke M.D.   On: 02/03/2024 13:50   DG Chest 2 View Result Date: 02/03/2024 CLINICAL DATA:  Chest and abdominal pain. EXAM: CHEST - 2 VIEW COMPARISON:  Radiograph 01/26/2024 FINDINGS: Low lung volumes. Cardiomegaly is stable. Unchanged mediastinal contours. Central vascular congestion. Mild atelectasis at both lung bases, left greater than right. No confluent opacity. No pleural effusion or pneumothorax. Vascular stent in the right proximal arm. No acute osseous findings. IMPRESSION: 1. Low lung volumes with mild bibasilar atelectasis. 2. Stable cardiomegaly. Central vascular congestion. Electronically Signed   By: Andrea Gasman M.D.   On: 02/03/2024 12:19      Subjective: Related in the hallway maintaining oxygen saturation 90%  or above on room air  Discharge Exam: Vitals:   02/28/24 1250 02/28/24 1255  BP:    Pulse:    Resp:    Temp:    SpO2: 90% 95%   Vitals:   02/28/24 1220 02/28/24 1245 02/28/24 1250 02/28/24 1255  BP:      Pulse:      Resp: 13     Temp:      TempSrc:      SpO2:  95% 90% 95%  Weight:      Height:        General: Pt is alert, awake, not in acute distress Cardiovascular: RRR, S1/S2 +, no rubs, no gallops Respiratory: CTA bilaterally, no wheezing, no rhonchi Abdominal: Soft, NT, ND, bowel sounds + Extremities: no edema, no  cyanosis    The results of significant diagnostics from this hospitalization (including imaging, microbiology, ancillary and laboratory) are listed below for reference.     Microbiology: Recent Results (from the past 240 hours)  Culture, blood (Routine X 2) w Reflex to ID Panel     Status: None   Collection Time: 02/22/24  7:11 PM   Specimen: BLOOD LEFT HAND  Result Value Ref Range Status   Specimen Description BLOOD LEFT HAND  Final   Special Requests   Final    BOTTLES DRAWN AEROBIC AND ANAEROBIC Blood Culture results may not be optimal due to an inadequate volume of blood received in culture bottles   Culture   Final    NO GROWTH 5 DAYS Performed at [facility], [address] [place], [place] [address]    Report Status 02/27/2024 FINAL  Final  Culture, blood (Routine X 2) w Reflex to ID Panel     Status: None   Collection Time: 02/22/24  7:14 PM   Specimen: BLOOD  Result Value Ref Range Status   Specimen Description BLOOD BLOOD LEFT HAND  Final   Special Requests   Final    BOTTLES DRAWN AEROBIC AND ANAEROBIC Blood Culture adequate volume   Culture   Final    NO GROWTH 5 DAYS Performed at [facility], [address] [place], [place] [address]    Report Status 02/27/2024 FINAL  Final  Aerobic/Anaerobic Culture w Gram Stain (surgical/deep wound)     Status: None   Collection Time: 02/23/24 12:43 PM   Specimen: Path fluid; Body Fluid  Result Value Ref Range Status   Specimen Description FLUID  Final   Special Requests RIGHT NECK ABSCESS  Final   Gram Stain RARE WBC SEEN ABUNDANT GRAM POSITIVE COCCI   Final   Culture   Final    ABUNDANT STAPHYLOCOCCUS AUREUS NO ANAEROBES ISOLATED Performed at [facility], [address], [place], [place] [address]    Report Status 02/28/2024 FINAL  Final   Organism ID, Bacteria STAPHYLOCOCCUS AUREUS  Final      Susceptibility   Staphylococcus aureus - MIC*    CIPROFLOXACIN  <=0.5 SENSITIVE Sensitive      ERYTHROMYCIN <=0.25 SENSITIVE Sensitive     GENTAMICIN  <=0.5 SENSITIVE Sensitive     OXACILLIN 0.5 SENSITIVE Sensitive     TETRACYCLINE <=1 SENSITIVE Sensitive     VANCOMYCIN  1 SENSITIVE Sensitive     TRIMETH /SULFA  <=10 SENSITIVE Sensitive     CLINDAMYCIN  <=0.25 SENSITIVE Sensitive     RIFAMPIN <=0.5 SENSITIVE Sensitive     Inducible Clindamycin  NEGATIVE Sensitive     LINEZOLID  2 SENSITIVE Sensitive     * ABUNDANT STAPHYLOCOCCUS  AUREUS     Labs: BNP (last 3 results) Recent Labs    12/29/23 0538 01/04/24 0424 01/16/24 0608  BNP 4,229.7* >4,500.0* 4,044.0*   Basic Metabolic Panel: Recent Labs  Lab 02/23/24 0554 02/24/24 0820 02/25/24 0624 02/26/24 0830 02/27/24 0518  NA 133* 135 135 138 139  K 5.2* 5.4* 4.8 4.3 4.3  CL 98 100 99 102 98  CO2 19* 19* 22 22 26   GLUCOSE 202* 144* 272* 113* 224*  BUN 60* 67* 42* 50* 25*  CREATININE 7.49* 8.19* 5.81* 6.79* 4.66*  CALCIUM  8.7* 8.3* 8.1* 8.4* 8.5*  MG 2.4 2.4 2.2 2.5*  --    Liver Function Tests: No results for input(s): AST, ALT, ALKPHOS, BILITOT, PROT, ALBUMIN  in the last 168 hours. No results for input(s): LIPASE, AMYLASE in the last 168 hours. No results for input(s): AMMONIA in the last 168 hours. CBC: Recent Labs  Lab 02/23/24 0554 02/24/24 0820 02/25/24 0958 02/26/24 0830 02/27/24 0518 02/28/24 0800  WBC 13.5* 13.0* 9.9 9.8 8.7 8.7  NEUTROABS 9.5* 6.7 6.5 5.7 5.6  --   HGB 7.9* 8.3* 8.3* 8.7* 8.7* 9.2*  HCT 24.8* 26.1* 27.8* 29.3* 28.5* 30.5*  MCV 84.9 85.3 89.7 89.6 88.2 88.9  PLT 251 252 231 257 264 235   Cardiac Enzymes: No results for input(s): CKTOTAL, CKMB, CKMBINDEX, TROPONINI in the last 168 hours. BNP: Invalid input(s): POCBNP CBG: Recent Labs  Lab 02/28/24 0059 02/28/24 0458 02/28/24 0535 02/28/24 0759 02/28/24 1147  GLUCAP 120* 69* 194* 206* 258*   D-Dimer No results for input(s): DDIMER in the last 72 hours. Hgb A1c No results for input(s): HGBA1C in  the last 72 hours. Lipid Profile No results for input(s): CHOL, HDL, LDLCALC, TRIG, CHOLHDL, LDLDIRECT in the last 72 hours. Thyroid  function studies No results for input(s): TSH, T4TOTAL, T3FREE, THYROIDAB in the last 72 hours.  Invalid input(s): FREET3 Anemia work up No results for input(s): VITAMINB12, FOLATE, FERRITIN, TIBC, IRON , RETICCTPCT in the last 72 hours. Urinalysis    Component Value Date/Time   COLORURINE YELLOW 10/25/2023 2300   APPEARANCEUR CLOUDY (A) 10/25/2023 2300   APPEARANCEUR Hazy 11/10/2013 2043   LABSPEC 1.016 10/25/2023 2300   LABSPEC 1.031 11/10/2013 2043   PHURINE 6.0 10/25/2023 2300   GLUCOSEU >=500 (A) 10/25/2023 2300   GLUCOSEU >=500 11/10/2013 2043   HGBUR SMALL (A) 10/25/2023 2300   BILIRUBINUR NEGATIVE 10/25/2023 2300   BILIRUBINUR negative 06/04/2018 1035   BILIRUBINUR Negative 11/24/2015 1443   BILIRUBINUR Negative 11/10/2013 2043   KETONESUR 5 (A) 10/25/2023 2300   PROTEINUR >=300 (A) 10/25/2023 2300   UROBILINOGEN 0.2 09/14/2019 1732   NITRITE NEGATIVE 10/25/2023 2300   LEUKOCYTESUR NEGATIVE 10/25/2023 2300   LEUKOCYTESUR 1+ 11/10/2013 2043   Sepsis Labs Recent Labs  Lab 02/25/24 0958 02/26/24 0830 02/27/24 0518 02/28/24 0800  WBC 9.9 9.8 8.7 8.7   Microbiology Recent Results (from the past 240 hours)  Culture, blood (Routine X 2) w Reflex to ID Panel     Status: None   Collection Time: 02/22/24  7:11 PM   Specimen: BLOOD LEFT HAND  Result Value Ref Range Status   Specimen Description BLOOD LEFT HAND  Final   Special Requests   Final    BOTTLES DRAWN AEROBIC AND ANAEROBIC Blood Culture results may not be optimal due to an inadequate volume of blood received in culture bottles   Culture   Final    NO GROWTH 5 DAYS Performed at [facility], [address], [place], [place] [address]    Report Status 02/27/2024 FINAL  Final  Culture, blood (Routine X 2) w Reflex to ID Panel     Status:  None   Collection Time: 02/22/24  7:14 PM   Specimen: BLOOD  Result Value Ref Range Status   Specimen Description BLOOD BLOOD LEFT HAND  Final   Special Requests   Final    BOTTLES DRAWN AEROBIC AND ANAEROBIC Blood Culture adequate volume   Culture   Final    NO GROWTH 5 DAYS Performed at [facility], [address], [place], [place] [address]    Report Status 02/27/2024 FINAL  Final  Aerobic/Anaerobic Culture w Gram Stain (surgical/deep wound)     Status: None   Collection Time: 02/23/24 12:43 PM   Specimen: Path fluid; Body Fluid  Result Value Ref Range Status   Specimen Description FLUID  Final   Special Requests RIGHT NECK ABSCESS  Final   Gram Stain RARE WBC SEEN ABUNDANT GRAM POSITIVE COCCI   Final   Culture   Final    ABUNDANT STAPHYLOCOCCUS AUREUS NO ANAEROBES ISOLATED Performed at [facility], [address] [place], [place] [address]    Report Status 02/28/2024 FINAL  Final   Organism ID, Bacteria STAPHYLOCOCCUS AUREUS  Final      Susceptibility   Staphylococcus aureus - MIC*    CIPROFLOXACIN  <=0.5 SENSITIVE Sensitive     ERYTHROMYCIN <=0.25 SENSITIVE Sensitive     GENTAMICIN  <=0.5 SENSITIVE Sensitive     OXACILLIN 0.5 SENSITIVE Sensitive     TETRACYCLINE <=1 SENSITIVE Sensitive     VANCOMYCIN  1 SENSITIVE Sensitive     TRIMETH /SULFA  <=10 SENSITIVE Sensitive     CLINDAMYCIN  <=0.25 SENSITIVE Sensitive     RIFAMPIN <=0.5 SENSITIVE Sensitive     Inducible Clindamycin  NEGATIVE Sensitive     LINEZOLID  2 SENSITIVE Sensitive     * ABUNDANT STAPHYLOCOCCUS AUREUS     Time coordinating discharge: Over 30 minutes  SIGNED:   Brayton Lye, MD  [facility] 02/28/2024, 3:30 PM Pager   If 7PM-7AM, please contact night-coverage [email or web address] Password [email or web address]

## 2024-02-28 NOTE — Progress Notes (Signed)
 Patient has very poor oral intake since yesterday with the dayshift. As for the shift, patient didn't eat any of her dinner. Encouraged to take something to drink but can't barely drink even half of an orange juice. Had multiple recurrence of hypoglycemia treated with d50. Blood pressure has been consistently elevated despite giving her scheduled and prn hydralazine . Also she complains of generalized pain and is treated with pain medications. Had given her lozenges to address her throat pain and crushed her pills mixed with apple sauce which she tolerated well. The aforementioned complaints has been referred and addressed by Dr. Marcene.

## 2024-02-28 NOTE — Progress Notes (Signed)
 DISCHARGE NOTE HOME Ashley Freeman to be discharged Home per MD order. Discussed prescriptions and follow up appointments with the patient. Prescriptions given to patient; medication list explained in detail. Patient verbalized understanding.  Skin clean, dry and intact without evidence of skin break down, no evidence of skin tears noted. IV catheter discontinued intact. Site without signs and symptoms of complications. Dressing and pressure applied. Pt denies pain at the site currently. No complaints noted.  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   An After Visit Summary (AVS) was printed and given to the patient. Patient escorted via wheelchair, and discharged home via private auto.  Peyton [name] Pepper, RN

## 2024-02-28 NOTE — Inpatient Diabetes Management (Signed)
 Inpatient Diabetes Program Recommendations  AACE/ADA: New Consensus Statement on Inpatient Glycemic Control (2015)  Target Ranges:  Prepandial:   less than 140 mg/dL      Peak postprandial:   less than 180 mg/dL (1-2 hours)      Critically ill patients:  140 - 180 mg/dL   Lab Results  Component Value Date   GLUCAP 206 (H) 02/28/2024   HGBA1C 10.8 (H) 12/20/2023    Review of Glycemic Control  Latest Reference Range & Units 02/27/24 16:12 02/27/24 18:30 02/27/24 18:47 02/27/24 21:07 02/27/24 21:47 02/28/24 00:12  Glucose-Capillary 70 - 99 mg/dL 855 (H) 38 (LL) 779 (H) 43 (LL) 83 37 (LL)  (LL): Data is critically low (H): Data is abnormally high  Latest Reference Range & Units 02/27/24 05:18  Creatinine 0.44 - 1.00 mg/dL 5.33 (H)  (H): Data is abnormally high  Diabetes history: DM1(does not make insulin .  Needs correction, basal and meal coverage)  Outpatient Diabetes medications: Lantus  10 units every day, Novolog  5 units TID   Current orders for Inpatient glycemic control: Novolog  0-9 units TID and 0-5 units at bedtime, Novolog  2 units TID  Inpatient Diabetes Program Recommendations:    Several episodes of hypoglycemia last evening and overnight; Semglee  discontinued.  Patient has T1DM and requires basal insulin .  Please consider:  Semglee  5 units every day Novolog  0-6 units TID and 0-5 units at bedtime  Thank you, Wyvonna Pinal, MSN, CDCES Diabetes Coordinator Inpatient Diabetes Program [phone] (team pager from 8a-5p)

## 2024-02-28 NOTE — Progress Notes (Signed)
 This RN removed pt's 02 to get OOB, walk in the room, and get to the chair. 02 dropped as low as 82% (with a good pleth). Pt said she did feel SOB. 02 put back on 2L and pt recovered to 95%. MD notified and this RN gave her an IS/flutter valve. MD ordered IV lasix  at this time.  WCTM

## 2024-02-28 NOTE — Plan of Care (Signed)
  Problem: Education: Goal: Knowledge of General Education information will improve Description: Including pain rating scale, medication(s)/side effects and non-pharmacologic comfort measures Outcome: Progressing   Problem: Health Behavior/Discharge Planning: Goal: Ability to manage health-related needs will improve Outcome: Progressing   Problem: Clinical Measurements: Goal: Ability to maintain clinical measurements within normal limits will improve Outcome: Progressing   Problem: Activity: Goal: Risk for activity intolerance will decrease Outcome: Progressing   Problem: Nutrition: Goal: Adequate nutrition will be maintained Outcome: Progressing   Problem: Pain Managment: Goal: General experience of comfort will improve and/or be controlled Outcome: Progressing   Problem: Safety: Goal: Ability to remain free from injury will improve Outcome: Progressing

## 2024-02-28 NOTE — Progress Notes (Signed)
 [facility] night cross cover note:   In the setting of recurrent hypoglycemia in spite of multiple amps of D50 this evening, with most recent CBG in the 60s, I have started continuous D10 running at 50 cc/h x 12 hours.  Will closely monitor insulin  volume status in the context of her end-stage renal disease on hemodialysis, for which it appears that she is next due for hemodialysis tomorrow, 02/29/2024.    Eva Pore, DO Hospitalist

## 2024-03-02 ENCOUNTER — Encounter: Payer: Self-pay | Admitting: Family Medicine

## 2024-03-02 ENCOUNTER — Telehealth (INDEPENDENT_AMBULATORY_CARE_PROVIDER_SITE_OTHER): Payer: Self-pay | Admitting: Otolaryngology

## 2024-03-02 NOTE — Progress Notes (Addendum)
 Late Note Entry- Mar 02, 2024  Received a call from [facility] this morning to advise navigator that pt's iv abx will arrive to clinic this afternoon and will be available with pt's treatment tomorrow. Update provided to the attending who d/c pt and to renal PA.   Randine Mungo Dialysis Navigator [phone]

## 2024-03-02 NOTE — Telephone Encounter (Signed)
 Tried to call to schedule follow-up appointment with Ashley Freeman in 3 wks - VM FULL

## 2024-03-08 ENCOUNTER — Emergency Department (HOSPITAL_COMMUNITY)

## 2024-03-08 ENCOUNTER — Other Ambulatory Visit: Payer: Self-pay

## 2024-03-08 ENCOUNTER — Emergency Department (HOSPITAL_COMMUNITY)
Admission: EM | Admit: 2024-03-08 | Discharge: 2024-03-08 | Disposition: A | Discharge status: Home / Self Care | Attending: Emergency Medicine | Admitting: Emergency Medicine

## 2024-03-08 ENCOUNTER — Encounter (HOSPITAL_COMMUNITY): Payer: Self-pay

## 2024-03-08 DIAGNOSIS — Z794 Long term (current) use of insulin: Secondary | ICD-10-CM | POA: Insufficient documentation

## 2024-03-08 DIAGNOSIS — R1084 Generalized abdominal pain: Secondary | ICD-10-CM | POA: Insufficient documentation

## 2024-03-08 DIAGNOSIS — E1022 Type 1 diabetes mellitus with diabetic chronic kidney disease: Secondary | ICD-10-CM | POA: Insufficient documentation

## 2024-03-08 DIAGNOSIS — I5020 Unspecified systolic (congestive) heart failure: Secondary | ICD-10-CM | POA: Insufficient documentation

## 2024-03-08 DIAGNOSIS — E10649 Type 1 diabetes mellitus with hypoglycemia without coma: Secondary | ICD-10-CM | POA: Diagnosis present

## 2024-03-08 DIAGNOSIS — R197 Diarrhea, unspecified: Secondary | ICD-10-CM | POA: Diagnosis not present

## 2024-03-08 DIAGNOSIS — E8770 Fluid overload, unspecified: Secondary | ICD-10-CM | POA: Diagnosis not present

## 2024-03-08 DIAGNOSIS — N186 End stage renal disease: Secondary | ICD-10-CM | POA: Insufficient documentation

## 2024-03-08 DIAGNOSIS — R011 Cardiac murmur, unspecified: Secondary | ICD-10-CM | POA: Insufficient documentation

## 2024-03-08 DIAGNOSIS — Z992 Dependence on renal dialysis: Secondary | ICD-10-CM | POA: Insufficient documentation

## 2024-03-08 DIAGNOSIS — D72829 Elevated white blood cell count, unspecified: Secondary | ICD-10-CM | POA: Insufficient documentation

## 2024-03-08 DIAGNOSIS — E162 Hypoglycemia, unspecified: Secondary | ICD-10-CM

## 2024-03-08 LAB — COMPREHENSIVE METABOLIC PANEL WITH GFR
ALT: 9 U/L (ref 0–44)
AST: 40 U/L (ref 15–41)
Albumin: 3 g/dL — ABNORMAL LOW (ref 3.5–5.0)
Alkaline Phosphatase: 656 U/L — ABNORMAL HIGH (ref 38–126)
Anion gap: 19 — ABNORMAL HIGH (ref 5–15)
BUN: 46 mg/dL — ABNORMAL HIGH (ref 6–20)
CO2: 22 mmol/L (ref 22–32)
Calcium: 8.3 mg/dL — ABNORMAL LOW (ref 8.9–10.3)
Chloride: 100 mmol/L (ref 98–111)
Creatinine, Ser: 7.78 mg/dL — ABNORMAL HIGH (ref 0.44–1.00)
GFR, Estimated: 7 mL/min — ABNORMAL LOW (ref >60)
Glucose, Bld: 154 mg/dL — ABNORMAL HIGH (ref 70–99)
Potassium: 6.1 mmol/L — ABNORMAL HIGH (ref 3.5–5.1)
Sodium: 141 mmol/L (ref 135–145)
Total Bilirubin: 0.8 mg/dL (ref 0.0–1.2)
Total Protein: 6.1 g/dL — ABNORMAL LOW (ref 6.5–8.1)

## 2024-03-08 LAB — CBC WITH DIFFERENTIAL/PLATELET
Abs Immature Granulocytes: 0.13 K/uL — ABNORMAL HIGH (ref 0.00–0.07)
Basophils Absolute: 0.1 K/uL (ref 0.0–0.1)
Basophils Relative: 1 %
Eosinophils Absolute: 0.2 K/uL (ref 0.0–0.5)
Eosinophils Relative: 2 %
HCT: 34.3 % — ABNORMAL LOW (ref 36.0–46.0)
Hemoglobin: 10.2 g/dL — ABNORMAL LOW (ref 12.0–15.0)
Immature Granulocytes: 1 %
Lymphocytes Relative: 27 %
Lymphs Abs: 3 K/uL (ref 0.7–4.0)
MCH: 26.5 pg (ref 26.0–34.0)
MCHC: 29.7 g/dL — ABNORMAL LOW (ref 30.0–36.0)
MCV: 89.1 fL (ref 80.0–100.0)
Monocytes Absolute: 0.9 K/uL (ref 0.1–1.0)
Monocytes Relative: 8 %
Neutro Abs: 6.7 K/uL (ref 1.7–7.7)
Neutrophils Relative %: 61 %
Platelets: 239 K/uL (ref 150–400)
RBC: 3.85 MIL/uL — ABNORMAL LOW (ref 3.87–5.11)
RDW: 14.9 % (ref 11.5–15.5)
WBC: 11 K/uL — ABNORMAL HIGH (ref 4.0–10.5)
nRBC: 0 % (ref 0.0–0.2)

## 2024-03-08 LAB — HCG, SERUM, QUALITATIVE: Preg, Serum: NEGATIVE

## 2024-03-08 LAB — I-STAT CG4 LACTIC ACID, ED: Lactic Acid, Venous: 1.7 mmol/L (ref 0.5–1.9)

## 2024-03-08 LAB — CBG MONITORING, ED
Glucose-Capillary: 208 mg/dL — ABNORMAL HIGH (ref 70–99)
Glucose-Capillary: 132 mg/dL — ABNORMAL HIGH (ref 70–99)

## 2024-03-08 MED ORDER — FENTANYL CITRATE (PF) 50 MCG/ML IJ SOSY
25.0000 ug | PREFILLED_SYRINGE | Freq: Once | INTRAMUSCULAR | Status: AC
  Administered 2024-03-08: 25 ug via INTRAVENOUS
  Filled 2024-03-08: qty 1

## 2024-03-08 MED ORDER — SODIUM CHLORIDE 0.9% FLUSH
3.0000 mL | Freq: Two times a day (BID) | INTRAVENOUS | Status: DC
  Administered 2024-03-08: 3 mL via INTRAVENOUS

## 2024-03-08 MED ORDER — SODIUM CHLORIDE 0.9% FLUSH: 3.0000 mL | INTRAVENOUS | Status: DC | PRN

## 2024-03-08 MED ORDER — SODIUM CHLORIDE 0.9 % IV SOLN: 250.0000 mL | INTRAVENOUS | Status: DC | PRN

## 2024-03-08 MED ORDER — SODIUM ZIRCONIUM CYCLOSILICATE 10 G PO PACK
10.0000 g | PACK | Freq: Once | ORAL | Status: AC
  Administered 2024-03-08: 10 g via ORAL
  Filled 2024-03-08: qty 1

## 2024-03-08 NOTE — ED Notes (Signed)
 Patient transported to CT

## 2024-03-08 NOTE — ED Notes (Signed)
 CBG 132.

## 2024-03-08 NOTE — Discharge Instructions (Addendum)
 The scans today look normal, your blood sugar has been better since you have been here and your temperature went back to normal.  There is some extra fluid in your stomach but the nephrologist reported that you did not need emergent dialysis today but it would be important to call your center tomorrow to see if they can dialyze you early.  If not it is very important that you go on Tuesday.  If you start having a fever, vomiting or other concerns prior to being able to go to dialysis return to the emergency room.

## 2024-03-08 NOTE — ED Provider Notes (Signed)
 [place] EMERGENCY DEPARTMENT AT [facility] Provider Note   CSN: [id number] Arrival date & time: 03/08/24  9394     Patient presents with: Hypoglycemia   Ashley Freeman is a 33 y.o. female.   Pt is a 33y/o female with hx of ESRD HD TTS, HFrEF (EF 30-35% in 08/2023), DM1, bipolar disorder, and frequent admission for DKA/volume overload p/w hyperglycemia 2/2 DKA and SOB iso volume overload related to noncompliance with dialysis and insulin  who was last admitted and d/ced on 10/31 after the above issues but also a neck abscess that required OR drainage by ENT and then cefazolin  post HD with stop date 03/07/2024 who is presenting today with hypoglycemia with EMS.  This morning family called EMS as patient was unresponsive and upon their arrival blood sugar was 46.  She received 1 mg of glucagon  IM with improvement of blood sugar and return of mental status back to baseline.  It was reported at her home that she last had dialysis on Tuesday however she tells me her last dialysis was Thursday but she did not go to dialysis yesterday because she did not have childcare.  She also reports yesterday she started to have mild cold-like symptoms with congestion and a cough but denied having a fever.  She has been compliant with her diabetic medication of Lantus  and NovoLog  and reports there is been no recent changes.  She had been eating and drinking normally and reports went to bed feeling okay.  Since coming around after receiving sugar this morning she now is complaining of abdominal pain diffusely and has had 2 episodes of diarrhea.  She denies any chest pain, shortness of breath.  She has no leg pain or swelling that she is aware of.  Abscess in her neck has been healing well but she has missed some doses of the cefazolin  because of missing dialysis.  The history is provided by the patient and medical records.  Hypoglycemia      Prior to Admission medications   Medication Sig  Start Date End Date Taking? Authorizing Provider  albuterol  (VENTOLIN  HFA) 108 (90 Base) MCG/ACT inhaler Inhale 2 puffs into the lungs every 4 (four) hours as needed for wheezing or shortness of breath. 11/24/22   [provider]  bumetanide  (BUMEX ) 2 MG tablet Take 10 mg by mouth daily.    [provider]  calcitRIOL  (ROCALTROL ) 0.25 MCG capsule Take 5 capsules (1.25 mcg total) by mouth every Tuesday, Thursday, and Saturday at 6 PM. Patient not taking: Reported on 02/22/2024 07/09/23   Cindy Garnette POUR, MD  carvedilol  (COREG ) 25 MG tablet Take 25 mg by mouth 2 (two) times daily with a meal.    [provider]  DULoxetine  (CYMBALTA ) 20 MG capsule Take 20 mg by mouth daily. 07/29/23   [provider]  fluticasone  (FLONASE ) 50 MCG/ACT nasal spray Place 2 sprays into both nostrils daily. 12/11/23 12/10/24  [provider]  HYDROcodone -acetaminophen  (NORCO/VICODIN) 5-325 MG tablet Take 1-2 tablets by mouth every 6 (six) hours as needed for moderate pain (pain score 4-6) or severe pain (pain score 7-10). 02/28/24   Elgergawy, Brayton RAMAN, MD  hydrOXYzine  (ATARAX ) 25 MG tablet Take 25 mg by mouth 3 (three) times daily as needed for anxiety, itching, nausea or vomiting.    [provider]  ketoconazole  (NIZORAL ) 2 % cream Apply 1 Application topically 2 (two) times daily. Apply 1gm twice daily to affected skin on feet 01/06/24   McCaughan, Dia D,  DPM  lamoTRIgine  (LAMICTAL ) 200 MG tablet Take 1 tablet (200 mg total) by mouth daily. 10/29/23   Regalado, Belkys A, MD  LANTUS  SOLOSTAR 100 UNIT/ML Solostar Pen Inject 10 Units into the skin daily. 02/07/24   Drusilla Sabas RAMAN, MD  metoCLOPramide  (REGLAN ) 10 MG tablet Take 0.5 tablets (5 mg total) by mouth every 8 (eight) hours as needed for nausea or refractory nausea / vomiting. 02/07/24   Drusilla Sabas RAMAN, MD  NOVOLOG  FLEXPEN 100 UNIT/ML FlexPen INJECT 5 UNITS UNDER THE SKIN 3 TIMES A DAY BEFORE MEALS. IF YOU DO NOT EAT A MEAL,  PLEASE CHEECK YOUR BLOOD SUGAR AND GIVE YOURSELF 1 UNIT OF INSULIN  FOR EVERY 50MG /DL GREATER THAN 869. MAX 30 UNITS PER DAY. 01/18/24   [provider]  OLANZapine  zydis (ZYPREXA ) 5 MG disintegrating tablet Take 5 mg by mouth at bedtime. 09/23/23 02/08/24  [provider]  pantoprazole  (PROTONIX ) 40 MG tablet Take 1 tablet (40 mg total) by mouth 2 (two) times daily. 02/07/24 04/10/24  Drusilla Sabas RAMAN, MD  prochlorperazine  (COMPAZINE ) 5 MG tablet Take 5 mg by mouth every 6 (six) hours as needed for nausea or vomiting.    [provider]  rosuvastatin  (CRESTOR ) 40 MG tablet Take 40 mg by mouth daily. 01/20/24 01/19/25  [provider]  sacubitril-valsartan (ENTRESTO) 24-26 MG Take 1 tablet by mouth 2 (two) times daily.    [provider]  sevelamer  carbonate (RENVELA ) 800 MG tablet Take 1 tablet (800 mg total) by mouth 3 (three) times daily with meals. 02/07/24   Drusilla Sabas RAMAN, MD  sodium bicarbonate  650 MG tablet Take 650 mg by mouth 2 (two) times daily. 07/29/23   [provider]  sucralfate  (CARAFATE ) 1 GM/10ML suspension Take 10 mLs (1 g total) by mouth 2 (two) times daily for 14 days. 02/07/24 02/24/24  Drusilla Sabas RAMAN, MD  SUMAtriptan  (IMITREX ) 50 MG tablet Take 50 mg by mouth every 2 (two) hours as needed for migraine or headache. 06/20/22   [provider]    Allergies: Keflex  [cephalexin ], Penicillins, Vibramycin  [doxycycline ], Benadryl  [diphenhydramine ], Dilaudid  [hydromorphone ], Methotrexate and trimetrexate, and Roxicodone  [oxycodone ]    Review of Systems  Updated Vital Signs BP (!) 157/95   Pulse 95   Temp 97.6 F (36.4 C) (Oral)   Resp 15   Ht 5' 3 (1.6 m)   Wt 62.5 kg   SpO2 98%   BMI 24.41 kg/m   Physical Exam Vitals and nursing note reviewed.  Constitutional:      General: She is not in acute distress.    Appearance: She is well-developed. She is ill-appearing.     Comments: Chronically ill-appearing  HENT:      Head: Normocephalic and atraumatic.  Eyes:     Pupils: Pupils are equal, round, and reactive to light.  Neck:     Comments: Surgical scar present on the neck which appears to be healing well with no significant erythema Cardiovascular:     Rate and Rhythm: Normal rate and regular rhythm.     Heart sounds: Murmur heard.     No friction rub.  Pulmonary:     Effort: Pulmonary effort is normal.     Breath sounds: Normal breath sounds. No wheezing or rales.  Abdominal:     General: Bowel sounds are normal. There is distension.     Palpations: Abdomen is soft.     Tenderness: There is abdominal tenderness. There is no guarding or rebound.     Comments:  Diffuse tenderness with palpation in all quadrants with signs of mild ascites and distention  Musculoskeletal:        General: No tenderness. Normal range of motion.     Right lower leg: No edema.     Left lower leg: No edema.     Comments: No edema  Skin:    General: Skin is warm and dry.     Coloration: Skin is pale.     Findings: No rash.  Neurological:     Mental Status: She is alert and oriented to person, place, and time. Mental status is at baseline.     Cranial Nerves: No cranial nerve deficit.  Psychiatric:        Behavior: Behavior normal.     (all labs ordered are listed, but only abnormal results are displayed) Labs Reviewed  CBC WITH DIFFERENTIAL/PLATELET - Abnormal; Notable for the following components:      Result Value   WBC 11.0 (*)    RBC 3.85 (*)    Hemoglobin 10.2 (*)    HCT 34.3 (*)    MCHC 29.7 (*)    Abs Immature Granulocytes 0.13 (*)    All other components within normal limits  COMPREHENSIVE METABOLIC PANEL WITH GFR - Abnormal; Notable for the following components:   Potassium 6.1 (*)    Glucose, Bld 154 (*)    BUN 46 (*)    Creatinine, Ser 7.78 (*)    Calcium  8.3 (*)    Total Protein 6.1 (*)    Albumin  3.0 (*)    Alkaline Phosphatase 656 (*)    GFR, Estimated 7 (*)    Anion gap 19 (*)    All  other components within normal limits  CBG MONITORING, ED - Abnormal; Notable for the following components:   Glucose-Capillary 208 (*)    All other components within normal limits  CBG MONITORING, ED - Abnormal; Notable for the following components:   Glucose-Capillary 132 (*)    All other components within normal limits  CULTURE, BLOOD (ROUTINE X 2)  CULTURE, BLOOD (ROUTINE X 2)  HCG, SERUM, QUALITATIVE  URINALYSIS, W/ REFLEX TO CULTURE (INFECTION SUSPECTED)  I-STAT CG4 LACTIC ACID, ED  CBG MONITORING, ED  CBG MONITORING, ED  I-STAT CG4 LACTIC ACID, ED    EKG: EKG Interpretation Date/Time:  Sunday March 08 2024 07:40:54 EST Ventricular Rate:  86 PR Interval:  145 QRS Duration:  89 QT Interval:  430 QTC Calculation: 515 R Axis:   48  Text Interpretation: Sinus rhythm recurrent Prolonged QT interval Confirmed by Doretha Folks ([id number]) on 03/08/2024 7:47:24 AM  Radiology: CT ABDOMEN PELVIS WO CONTRAST Result Date: 03/08/2024 EXAM: CT ABDOMEN AND PELVIS WITHOUT CONTRAST 03/08/2024 08:45:12 AM TECHNIQUE: CT of the abdomen and pelvis was performed without the administration of intravenous contrast. Multiplanar reformatted images are provided for review. Automated exposure control, iterative reconstruction, and/or weight-based adjustment of the mA/kV was utilized to reduce the radiation dose to as low as reasonably achievable. COMPARISON: Similar to the previous exam. CLINICAL HISTORY: Abdominal pain, acute, nonlocalized. FINDINGS: LOWER CHEST: Heterogeneous ground glass attenuation is identified within both lung bases along with mild interstitial thickening suggesting pulmonary edema. Small right pleural effusion. LIVER: The liver is unremarkable. GALLBLADDER AND BILE DUCTS: Status post cholecystectomy. No biliary ductal dilatation. SPLEEN: The spleen is within normal limits in size and appearance. PANCREAS: The pancreas is normal in size and contour without focal lesion or ductal  dilatation. ADRENAL GLANDS: Normal size and morphology bilaterally. No nodule,  thickening, or hemorrhage. No periadrenal stranding. KIDNEYS, URETERS AND BLADDER: No stones in the kidneys or ureters. No hydronephrosis. No perinephric or periureteral stranding. The urinary bladder is decompressed. GI AND BOWEL: Stomach demonstrates no acute abnormality. The appendix is visualized and normal in caliber, without wall thickening, periappendiceal inflammation, or fluid. There is no bowel obstruction. PERITONEUM AND RETROPERITONEUM: Moderate volume of ascites noted within the abdomen and pelvis, similar to the previous exam. No free air. VASCULATURE: Aorta is normal in caliber. Extensive vascular calcifications are again noted. LYMPH NODES: No lymphadenopathy. REPRODUCTIVE ORGANS: Uterus and adnexal structures are unremarkable. BONES AND SOFT TISSUES: No acute osseous abnormality. Diffuse body wall edema compatible with anasarca. IMPRESSION: 1. Findings of pulmonary edema with small right pleural effusion. 2. Moderate ascites, similar to prior. 3. Diffuse body wall edema compatible with anasarca. Electronically signed by: Waddell Calk MD 03/08/2024 08:52 AM EST RP Workstation: [id number]   DG Chest Port 1 View Result Date: 03/08/2024 CLINICAL DATA:  Hypoglycemia, unresponsive, concern for sepsis EXAM: PORTABLE CHEST 1 VIEW COMPARISON:  02/22/2024 FINDINGS: Similar mild cardiomegaly without acute airspace process, pneumonia, collapse, consolidation, edema pattern, or CHF. No effusion or pneumothorax. Trachea midline. Monitor leads overlie the chest. Right upper arm axillary stent noted. IMPRESSION: Cardiomegaly without acute process. Electronically Signed   By: CHRISTELLA.  Shick M.D.   On: 03/08/2024 08:06     Procedures   Medications Ordered in the ED  sodium chloride  flush (NS) 0.9 % injection 3 mL (3 mLs Intravenous Given 03/08/24 1035)  sodium chloride  flush (NS) 0.9 % injection 3 mL (has no administration in time  range)  0.9 %  sodium chloride  infusion (has no administration in time range)  fentaNYL  (SUBLIMAZE ) injection 25 mcg (25 mcg Intravenous Given 03/08/24 0737)  sodium zirconium cyclosilicate  (LOKELMA ) packet 10 g (10 g Oral Given 03/08/24 1035)                                    Medical Decision Making Amount and/or Complexity of Data Reviewed External Data Reviewed: notes. Labs: ordered. Decision-making details documented in ED Course. Radiology: ordered and independent interpretation performed. Decision-making details documented in ED Course. ECG/medicine tests: ordered and independent interpretation performed. Decision-making details documented in ED Course.  Risk Prescription drug management.   Pt with multiple medical problems and comorbidities and presenting today with a complaint that caries a high risk for morbidity and mortality.  Here today with the above issues.  Patient was found to be hypoglycemic, hypothermic in the setting of recent hospitalization for DKA and neck abscess.  Patient was supposed to be receiving cefazolin  after hemodialysis to finish yesterday but she missed several doses because of missing dialysis.  She does have distention in her abdomen which suspect is related to fluid from missed dialysis.  Also having diffuse abdominal pain and had several episodes of diarrhea here.  Given patient's hypothermia concern for possible sepsis.  Hypothermia can be related to hypoglycemia which could also be related to sepsis or poor oral intake or poor clearance of her diabetic meds due to lack of dialysis.  Patient did receive glucagon  with improvement of blood sugar.  She is awake and mentating normally and low suspicion for DKA at this time, meningitis or encephalitis.  Undifferentiated septic workup initiated.  Also due to patient missing dialysis concern for possible electrolyte abnormalities.  I independently interpreted patient's EKG and labs.  EKG does show sinus  rhythm with  a prolonged QT which is unchanged for this patient.  CBC with a leukocytosis of 11 and a stable hemoglobin of 10, CMP with findings consistent with end-stage renal disease with an anion gap of 19 elevated creatinine and BUN and a potassium of 6.1.  Lactic acid within normal limits.  Repeat temperature is normal and will remove Bair hugger.  Blood sugar has remained stable.  Did discuss with Dr. Geralynn patient's case and at this time she does not require emergent dialysis. I have independently visualized and interpreted pt's images today.  Chest x-ray with cardiomegaly and unchanged from priors, abdominal CT without evidence of obstruction or colitis.  Radiology reports findings of pulmonary edema with small right pleural effusion and moderate ascites similar to prior.  Will ensure patient is able to maintain body temperature and eat and drink here.  Will discern if she needs admission versus discharge home with calling dialysis tomorrow to see if she can get in sooner or going on Tuesday.  She was given a dose of Lokelma .  12:39 PM Patient's temperature has been stable off Bair hugger now for hours.  Blood sugar has been stable.  At this time there is not a indication for patient to be admitted.  Encouraged her to follow-up with dialysis tomorrow to see if they can dialyze her early since she missed it on Saturday.  Repeat blood sugar has been stable at 132      Final diagnoses:  Hypoglycemia  Hypervolemia, unspecified hypervolemia type    ED Discharge Orders     None          Doretha Folks, MD 03/08/24 1239

## 2024-03-08 NOTE — ED Triage Notes (Signed)
 Pt BIB by [facility] for hypoglycemia with a BG of 46 and unresponsive. En route she received 1 mg of glucagon  IM and BG came up to 121.  Pt coming from home and receives dialysis - scheduled to receive it on Tuesday. Fistula in the right arm.   136/68 84 98% RA   BG of 141 @ 0604

## 2024-03-09 LAB — CBG MONITORING, ED: Glucose-Capillary: 141 mg/dL — ABNORMAL HIGH (ref 70–99)

## 2024-03-13 LAB — CULTURE, BLOOD (ROUTINE X 2)
Culture: NO GROWTH
Culture: NO GROWTH

## 2024-03-16 ENCOUNTER — Emergency Department (HOSPITAL_COMMUNITY)

## 2024-03-16 ENCOUNTER — Encounter (HOSPITAL_COMMUNITY): Payer: Self-pay

## 2024-03-16 ENCOUNTER — Other Ambulatory Visit: Payer: Self-pay

## 2024-03-16 ENCOUNTER — Inpatient Hospital Stay (HOSPITAL_COMMUNITY)
Admission: EM | Admit: 2024-03-16 | Discharge: 2024-03-18 | DRG: 637 | Disposition: A | Discharge status: Home / Self Care | Attending: Emergency Medicine | Admitting: Emergency Medicine

## 2024-03-16 DIAGNOSIS — N186 End stage renal disease: Secondary | ICD-10-CM | POA: Diagnosis present

## 2024-03-16 DIAGNOSIS — Z833 Family history of diabetes mellitus: Secondary | ICD-10-CM | POA: Diagnosis not present

## 2024-03-16 DIAGNOSIS — I428 Other cardiomyopathies: Secondary | ICD-10-CM | POA: Diagnosis present

## 2024-03-16 DIAGNOSIS — E875 Hyperkalemia: Secondary | ICD-10-CM | POA: Diagnosis present

## 2024-03-16 DIAGNOSIS — I5022 Chronic systolic (congestive) heart failure: Secondary | ICD-10-CM | POA: Diagnosis present

## 2024-03-16 DIAGNOSIS — Z825 Family history of asthma and other chronic lower respiratory diseases: Secondary | ICD-10-CM | POA: Diagnosis not present

## 2024-03-16 DIAGNOSIS — E1022 Type 1 diabetes mellitus with diabetic chronic kidney disease: Secondary | ICD-10-CM | POA: Diagnosis present

## 2024-03-16 DIAGNOSIS — I132 Hypertensive heart and chronic kidney disease with heart failure and with stage 5 chronic kidney disease, or end stage renal disease: Secondary | ICD-10-CM | POA: Diagnosis present

## 2024-03-16 DIAGNOSIS — Z79899 Other long term (current) drug therapy: Secondary | ICD-10-CM | POA: Diagnosis not present

## 2024-03-16 DIAGNOSIS — Z885 Allergy status to narcotic agent status: Secondary | ICD-10-CM | POA: Diagnosis not present

## 2024-03-16 DIAGNOSIS — Z91158 Patient's noncompliance with renal dialysis for other reason: Secondary | ICD-10-CM

## 2024-03-16 DIAGNOSIS — I5020 Unspecified systolic (congestive) heart failure: Secondary | ICD-10-CM | POA: Diagnosis present

## 2024-03-16 DIAGNOSIS — F319 Bipolar disorder, unspecified: Secondary | ICD-10-CM | POA: Diagnosis present

## 2024-03-16 DIAGNOSIS — Z91199 Patient's noncompliance with other medical treatment and regimen due to unspecified reason: Secondary | ICD-10-CM

## 2024-03-16 DIAGNOSIS — Z881 Allergy status to other antibiotic agents status: Secondary | ICD-10-CM

## 2024-03-16 DIAGNOSIS — E785 Hyperlipidemia, unspecified: Secondary | ICD-10-CM | POA: Diagnosis present

## 2024-03-16 DIAGNOSIS — Z880 Allergy status to penicillin: Secondary | ICD-10-CM

## 2024-03-16 DIAGNOSIS — Z883 Allergy status to other anti-infective agents status: Secondary | ICD-10-CM | POA: Diagnosis not present

## 2024-03-16 DIAGNOSIS — E1010 Type 1 diabetes mellitus with ketoacidosis without coma: Secondary | ICD-10-CM | POA: Diagnosis present

## 2024-03-16 DIAGNOSIS — D631 Anemia in chronic kidney disease: Secondary | ICD-10-CM | POA: Diagnosis present

## 2024-03-16 DIAGNOSIS — I152 Hypertension secondary to endocrine disorders: Secondary | ICD-10-CM | POA: Diagnosis present

## 2024-03-16 DIAGNOSIS — Z888 Allergy status to other drugs, medicaments and biological substances status: Secondary | ICD-10-CM | POA: Diagnosis not present

## 2024-03-16 DIAGNOSIS — Z992 Dependence on renal dialysis: Secondary | ICD-10-CM

## 2024-03-16 DIAGNOSIS — E1159 Type 2 diabetes mellitus with other circulatory complications: Secondary | ICD-10-CM | POA: Diagnosis present

## 2024-03-16 DIAGNOSIS — I10 Essential (primary) hypertension: Secondary | ICD-10-CM | POA: Diagnosis present

## 2024-03-16 LAB — CBC WITH DIFFERENTIAL/PLATELET
Abs Immature Granulocytes: 0.06 K/uL (ref 0.00–0.07)
Basophils Absolute: 0.1 K/uL (ref 0.0–0.1)
Basophils Relative: 1 %
Eosinophils Absolute: 0.6 K/uL — ABNORMAL HIGH (ref 0.0–0.5)
Eosinophils Relative: 8 %
HCT: 34.3 % — ABNORMAL LOW (ref 36.0–46.0)
Hemoglobin: 10 g/dL — ABNORMAL LOW (ref 12.0–15.0)
Immature Granulocytes: 1 %
Lymphocytes Relative: 31 %
Lymphs Abs: 2.5 K/uL (ref 0.7–4.0)
MCH: 27.5 pg (ref 26.0–34.0)
MCHC: 29.2 g/dL — ABNORMAL LOW (ref 30.0–36.0)
MCV: 94.5 fL (ref 80.0–100.0)
Monocytes Absolute: 0.6 K/uL (ref 0.1–1.0)
Monocytes Relative: 8 %
Neutro Abs: 4.2 K/uL (ref 1.7–7.7)
Neutrophils Relative %: 51 %
Platelets: 265 K/uL (ref 150–400)
RBC: 3.63 MIL/uL — ABNORMAL LOW (ref 3.87–5.11)
RDW: 16.8 % — ABNORMAL HIGH (ref 11.5–15.5)
WBC: 8.1 K/uL (ref 4.0–10.5)
nRBC: 0 % (ref 0.0–0.2)

## 2024-03-16 LAB — BASIC METABOLIC PANEL WITH GFR
Anion gap: 21 — ABNORMAL HIGH (ref 5–15)
Anion gap: 19 — ABNORMAL HIGH (ref 5–15)
BUN: 55 mg/dL — ABNORMAL HIGH (ref 6–20)
BUN: 58 mg/dL — ABNORMAL HIGH (ref 6–20)
CO2: 16 mmol/L — ABNORMAL LOW (ref 22–32)
CO2: 15 mmol/L — ABNORMAL LOW (ref 22–32)
Calcium: 8.2 mg/dL — ABNORMAL LOW (ref 8.9–10.3)
Calcium: 8.1 mg/dL — ABNORMAL LOW (ref 8.9–10.3)
Chloride: 89 mmol/L — ABNORMAL LOW (ref 98–111)
Chloride: 94 mmol/L — ABNORMAL LOW (ref 98–111)
Creatinine, Ser: 8.83 mg/dL — ABNORMAL HIGH (ref 0.44–1.00)
Creatinine, Ser: 8.85 mg/dL — ABNORMAL HIGH (ref 0.44–1.00)
GFR, Estimated: 6 mL/min — ABNORMAL LOW (ref >60)
GFR, Estimated: 6 mL/min — ABNORMAL LOW (ref >60)
Glucose, Bld: 1157 mg/dL (ref 70–99)
Glucose, Bld: 805 mg/dL (ref 70–99)
Potassium: 6.1 mmol/L — ABNORMAL HIGH (ref 3.5–5.1)
Potassium: 4.9 mmol/L (ref 3.5–5.1)
Sodium: 126 mmol/L — ABNORMAL LOW (ref 135–145)
Sodium: 128 mmol/L — ABNORMAL LOW (ref 135–145)

## 2024-03-16 LAB — I-STAT VENOUS BLOOD GAS, ED
Acid-base deficit: 10 mmol/L — ABNORMAL HIGH (ref 0.0–2.0)
Bicarbonate: 16.8 mmol/L — ABNORMAL LOW (ref 20.0–28.0)
Calcium, Ion: 1.02 mmol/L — ABNORMAL LOW (ref 1.15–1.40)
HCT: 35 % — ABNORMAL LOW (ref 36.0–46.0)
Hemoglobin: 11.9 g/dL — ABNORMAL LOW (ref 12.0–15.0)
O2 Saturation: 93 %
Potassium: 6 mmol/L — ABNORMAL HIGH (ref 3.5–5.1)
Sodium: 124 mmol/L — ABNORMAL LOW (ref 135–145)
TCO2: 18 mmol/L — ABNORMAL LOW (ref 22–32)
pCO2, Ven: 38.4 mmHg — ABNORMAL LOW (ref 44–60)
pH, Ven: 7.249 — ABNORMAL LOW (ref 7.25–7.43)
pO2, Ven: 79 mmHg — ABNORMAL HIGH (ref 32–45)

## 2024-03-16 LAB — CBG MONITORING, ED
Glucose-Capillary: >600 mg/dL (ref 70–99)
Glucose-Capillary: >600 mg/dL (ref 70–99)
Glucose-Capillary: >600 mg/dL (ref 70–99)
Glucose-Capillary: >600 mg/dL (ref 70–99)
Glucose-Capillary: >600 mg/dL (ref 70–99)

## 2024-03-16 LAB — I-STAT CHEM 8, ED
BUN: 53 mg/dL — ABNORMAL HIGH (ref 6–20)
Calcium, Ion: 1.01 mmol/L — ABNORMAL LOW (ref 1.15–1.40)
Chloride: 96 mmol/L — ABNORMAL LOW (ref 98–111)
Creatinine, Ser: 8.5 mg/dL — ABNORMAL HIGH (ref 0.44–1.00)
Glucose, Bld: >700 mg/dL (ref 70–99)
HCT: 36 % (ref 36.0–46.0)
Hemoglobin: 12.2 g/dL (ref 12.0–15.0)
Potassium: 6 mmol/L — ABNORMAL HIGH (ref 3.5–5.1)
Sodium: 125 mmol/L — ABNORMAL LOW (ref 135–145)
TCO2: 17 mmol/L — ABNORMAL LOW (ref 22–32)

## 2024-03-16 LAB — BETA-HYDROXYBUTYRIC ACID: Beta-Hydroxybutyric Acid: 2.1 mmol/L — ABNORMAL HIGH (ref 0.05–0.27)

## 2024-03-16 LAB — I-STAT CG4 LACTIC ACID, ED: Lactic Acid, Venous: 1.6 mmol/L (ref 0.5–1.9)

## 2024-03-16 LAB — HCG, SERUM, QUALITATIVE: Preg, Serum: NEGATIVE

## 2024-03-16 LAB — OSMOLALITY: Osmolality: 337 mosm/kg (ref 275–295)

## 2024-03-16 MED ORDER — PANTOPRAZOLE SODIUM 40 MG PO TBEC
40.0000 mg | DELAYED_RELEASE_TABLET | Freq: Two times a day (BID) | ORAL | Status: DC
  Administered 2024-03-17 – 2024-03-18 (×3): 40 mg via ORAL
  Filled 2024-03-16 (×3): qty 1

## 2024-03-16 MED ORDER — CHLORHEXIDINE GLUCONATE CLOTH 2 % EX PADS
6.0000 | MEDICATED_PAD | Freq: Every day | CUTANEOUS | Status: DC
  Administered 2024-03-17 – 2024-03-18 (×2): 6 via TOPICAL

## 2024-03-16 MED ORDER — ACETAMINOPHEN 500 MG PO TABS: 1000.0000 mg | ORAL_TABLET | Freq: Four times a day (QID) | ORAL | Status: DC | PRN

## 2024-03-16 MED ORDER — CALCIUM GLUCONATE-NACL 1-0.675 GM/50ML-% IV SOLN
1.0000 g | Freq: Once | INTRAVENOUS | Status: AC
  Administered 2024-03-16: 1000 mg via INTRAVENOUS
  Filled 2024-03-16: qty 50

## 2024-03-16 MED ORDER — SEVELAMER CARBONATE 800 MG PO TABS
800.0000 mg | ORAL_TABLET | Freq: Three times a day (TID) | ORAL | Status: DC
  Administered 2024-03-17 – 2024-03-18 (×2): 800 mg via ORAL
  Filled 2024-03-16 (×5): qty 1

## 2024-03-16 MED ORDER — ALBUTEROL SULFATE (2.5 MG/3ML) 0.083% IN NEBU: 2.5000 mg | INHALATION_SOLUTION | RESPIRATORY_TRACT | Status: DC | PRN

## 2024-03-16 MED ORDER — PROCHLORPERAZINE EDISYLATE 10 MG/2ML IJ SOLN: 10.0000 mg | Freq: Four times a day (QID) | INTRAMUSCULAR | Status: DC | PRN

## 2024-03-16 MED ORDER — HYDROXYZINE HCL 10 MG PO TABS: 10.0000 mg | ORAL_TABLET | Freq: Three times a day (TID) | ORAL | Status: DC | PRN

## 2024-03-16 MED ORDER — DEXTROSE 50 % IV SOLN
0.0000 mL | INTRAVENOUS | Status: DC | PRN
  Administered 2024-03-18: 50 mL via INTRAVENOUS
  Filled 2024-03-16: qty 50

## 2024-03-16 MED ORDER — ONDANSETRON HCL 4 MG/2ML IJ SOLN: 4.0000 mg | Freq: Once | INTRAMUSCULAR | Status: DC

## 2024-03-16 MED ORDER — HYDROXYZINE HCL 10 MG PO TABS
10.0000 mg | ORAL_TABLET | Freq: Once | ORAL | Status: DC
Start: 2024-03-16 — End: 2024-03-16
  Filled 2024-03-16: qty 1

## 2024-03-16 MED ORDER — HYDROXYZINE HCL 25 MG PO TABS
25.0000 mg | ORAL_TABLET | Freq: Three times a day (TID) | ORAL | Status: DC | PRN
  Administered 2024-03-17 (×3): 25 mg via ORAL
  Filled 2024-03-16 (×3): qty 1

## 2024-03-16 MED ORDER — DULOXETINE HCL 20 MG PO CPEP
20.0000 mg | ORAL_CAPSULE | Freq: Every day | ORAL | Status: DC
  Administered 2024-03-18: 20 mg via ORAL
  Filled 2024-03-16: qty 1

## 2024-03-16 MED ORDER — DEXTROSE IN LACTATED RINGERS 5 % IV SOLN: INTRAVENOUS | Status: DC

## 2024-03-16 MED ORDER — HYDRALAZINE HCL 20 MG/ML IJ SOLN
10.0000 mg | INTRAMUSCULAR | Status: DC | PRN
  Administered 2024-03-16: 10 mg via INTRAVENOUS
  Filled 2024-03-16: qty 1
  Filled 2024-03-16: qty 0.5
  Filled 2024-03-16: qty 1

## 2024-03-16 MED ORDER — INSULIN REGULAR(HUMAN) IN NACL 100-0.9 UT/100ML-% IV SOLN
INTRAVENOUS | Status: DC
  Administered 2024-03-16: 5.5 [IU]/h via INTRAVENOUS
  Filled 2024-03-16 (×2): qty 100

## 2024-03-16 MED ORDER — MORPHINE SULFATE (PF) 2 MG/ML IV SOLN
4.0000 mg | Freq: Once | INTRAVENOUS | Status: AC
  Administered 2024-03-16: 4 mg via INTRAVENOUS
  Filled 2024-03-16: qty 2

## 2024-03-16 MED ORDER — HYDROMORPHONE HCL 1 MG/ML IJ SOLN
1.0000 mg | INTRAMUSCULAR | Status: AC | PRN
Start: 2024-03-16 — End: 2024-03-17
  Administered 2024-03-16 – 2024-03-17 (×3): 1 mg via INTRAVENOUS
  Filled 2024-03-16 (×2): qty 1

## 2024-03-16 MED ORDER — LACTATED RINGERS IV SOLN: INTRAVENOUS | Status: DC

## 2024-03-16 MED ORDER — SODIUM BICARBONATE 650 MG PO TABS
650.0000 mg | ORAL_TABLET | Freq: Two times a day (BID) | ORAL | Status: DC
  Administered 2024-03-17 – 2024-03-18 (×3): 650 mg via ORAL
  Filled 2024-03-16 (×3): qty 1

## 2024-03-16 MED ORDER — LACTATED RINGERS IV BOLUS
20.0000 mL/kg | Freq: Once | INTRAVENOUS | Status: AC
  Administered 2024-03-16: 1250 mL via INTRAVENOUS

## 2024-03-16 MED ORDER — ROSUVASTATIN CALCIUM 20 MG PO TABS
40.0000 mg | ORAL_TABLET | Freq: Every day | ORAL | Status: DC
  Administered 2024-03-17 – 2024-03-18 (×2): 40 mg via ORAL
  Filled 2024-03-16 (×2): qty 2

## 2024-03-16 MED ORDER — LAMOTRIGINE 100 MG PO TABS
200.0000 mg | ORAL_TABLET | Freq: Every day | ORAL | Status: DC
  Administered 2024-03-18: 200 mg via ORAL
  Filled 2024-03-16: qty 2

## 2024-03-16 MED ORDER — CARVEDILOL 25 MG PO TABS
25.0000 mg | ORAL_TABLET | Freq: Two times a day (BID) | ORAL | Status: DC
  Administered 2024-03-17 – 2024-03-18 (×2): 25 mg via ORAL
  Filled 2024-03-16 (×3): qty 1

## 2024-03-16 MED ORDER — HEPARIN SODIUM (PORCINE) 5000 UNIT/ML IJ SOLN
5000.0000 [IU] | Freq: Three times a day (TID) | INTRAMUSCULAR | Status: DC
  Administered 2024-03-17: 5000 [IU] via SUBCUTANEOUS
  Filled 2024-03-16 (×4): qty 1

## 2024-03-16 MED ORDER — SODIUM ZIRCONIUM CYCLOSILICATE 10 G PO PACK
10.0000 g | PACK | Freq: Once | ORAL | Status: AC
  Administered 2024-03-16: 10 g via ORAL
  Filled 2024-03-16: qty 1

## 2024-03-16 NOTE — ED Notes (Signed)
 TRN attempted IV access and was unable to obtain, EDP made aware and is attempting access at bedside. Patient reports she has previously needed midlines for access.

## 2024-03-16 NOTE — ED Provider Notes (Signed)
 [place] EMERGENCY DEPARTMENT AT [facility] Provider Note   CSN: [id number] Arrival date & time: 03/16/24  1426     Patient presents with: Loss of Consciousness and Diabetic Ketoacidosis   Ashley Freeman is a 33 y.o. female.  With a history of type 1 diabetes, DKA, HFrEF ESRD on dialysis who presents to the ED after loss of consciousness.  Hyperglycemia at home with episode of syncope.  Last dialysis was 4 days ago.  Missed 1 session.  No fevers chills.  Does report low mechanism fall at home with diffuse body pain.  No head trauma.  Has had similar presentations associated with DKA in the past.    Loss of Consciousness      Prior to Admission medications   Medication Sig Start Date End Date Taking? Authorizing Provider  albuterol  (VENTOLIN  HFA) 108 (90 Base) MCG/ACT inhaler Inhale 2 puffs into the lungs every 4 (four) hours as needed for wheezing or shortness of breath. 11/24/22   [provider]  bumetanide  (BUMEX ) 2 MG tablet Take 10 mg by mouth daily.    [provider]  calcitRIOL  (ROCALTROL ) 0.25 MCG capsule Take 5 capsules (1.25 mcg total) by mouth every Tuesday, Thursday, and Saturday at 6 PM. Patient not taking: Reported on 02/22/2024 07/09/23   Cindy Garnette POUR, MD  carvedilol  (COREG ) 25 MG tablet Take 25 mg by mouth 2 (two) times daily with a meal.    [provider]  DULoxetine  (CYMBALTA ) 20 MG capsule Take 20 mg by mouth daily. 07/29/23   [provider]  fluticasone  (FLONASE ) 50 MCG/ACT nasal spray Place 2 sprays into both nostrils daily. 12/11/23 12/10/24  [provider]  HYDROcodone -acetaminophen  (NORCO/VICODIN) 5-325 MG tablet Take 1-2 tablets by mouth every 6 (six) hours as needed for moderate pain (pain score 4-6) or severe pain (pain score 7-10). 02/28/24   Elgergawy, Brayton RAMAN, MD  hydrOXYzine  (ATARAX ) 25 MG tablet Take 25 mg by mouth 3 (three) times daily as needed for anxiety, itching, nausea or  vomiting.    [provider]  ketoconazole  (NIZORAL ) 2 % cream Apply 1 Application topically 2 (two) times daily. Apply 1gm twice daily to affected skin on feet 01/06/24   McCaughan, Dia D, DPM  lamoTRIgine  (LAMICTAL ) 200 MG tablet Take 1 tablet (200 mg total) by mouth daily. 10/29/23   Regalado, Belkys A, MD  LANTUS  SOLOSTAR 100 UNIT/ML Solostar Pen Inject 10 Units into the skin daily. 02/07/24   Drusilla Sabas RAMAN, MD  metoCLOPramide  (REGLAN ) 10 MG tablet Take 0.5 tablets (5 mg total) by mouth every 8 (eight) hours as needed for nausea or refractory nausea / vomiting. 02/07/24   Drusilla Sabas RAMAN, MD  NOVOLOG  FLEXPEN 100 UNIT/ML FlexPen INJECT 5 UNITS UNDER THE SKIN 3 TIMES A DAY BEFORE MEALS. IF YOU DO NOT EAT A MEAL, PLEASE CHEECK YOUR BLOOD SUGAR AND GIVE YOURSELF 1 UNIT OF INSULIN  FOR EVERY 50MG /DL GREATER THAN 869. MAX 30 UNITS PER DAY. 01/18/24   [provider]  OLANZapine  zydis (ZYPREXA ) 5 MG disintegrating tablet Take 5 mg by mouth at bedtime. 09/23/23 02/08/24  [provider]  pantoprazole  (PROTONIX ) 40 MG tablet Take 1 tablet (40 mg total) by mouth 2 (two) times daily. 02/07/24 04/10/24  Drusilla Sabas RAMAN, MD  prochlorperazine  (COMPAZINE ) 5 MG tablet Take 5 mg by mouth every 6 (six) hours as needed for nausea or vomiting.    [provider]  rosuvastatin  (CRESTOR ) 40 MG tablet Take 40 mg by  mouth daily. 01/20/24 01/19/25  [provider]  sacubitril-valsartan (ENTRESTO) 24-26 MG Take 1 tablet by mouth 2 (two) times daily.    [provider]  sevelamer  carbonate (RENVELA ) 800 MG tablet Take 1 tablet (800 mg total) by mouth 3 (three) times daily with meals. 02/07/24   Drusilla Sabas RAMAN, MD  sodium bicarbonate  650 MG tablet Take 650 mg by mouth 2 (two) times daily. 07/29/23   [provider]  sucralfate  (CARAFATE ) 1 GM/10ML suspension Take 10 mLs (1 g total) by mouth 2 (two) times daily for 14 days. 02/07/24 02/24/24  Drusilla Sabas RAMAN, MD  SUMAtriptan   (IMITREX ) 50 MG tablet Take 50 mg by mouth every 2 (two) hours as needed for migraine or headache. 06/20/22   [provider]    Allergies: Keflex  [cephalexin ], Penicillins, Vibramycin  [doxycycline ], Benadryl  [diphenhydramine ], Dilaudid  [hydromorphone ], Methotrexate and trimetrexate, and Roxicodone  [oxycodone ]    Review of Systems  Cardiovascular:  Positive for syncope.    Updated Vital Signs BP (!) 159/104   Pulse 100   Temp 97.7 F (36.5 C)   Resp (!) 24   SpO2 98%   Physical Exam Vitals and nursing note reviewed.  HENT:     Head: Normocephalic and atraumatic.     Mouth/Throat:     Mouth: Mucous membranes are dry.  Eyes:     Pupils: Pupils are equal, round, and reactive to light.  Cardiovascular:     Rate and Rhythm: Normal rate and regular rhythm.  Pulmonary:     Effort: Respiratory distress present.     Breath sounds: Normal breath sounds.  Abdominal:     Palpations: Abdomen is soft.     Tenderness: There is no abdominal tenderness.  Musculoskeletal:     Cervical back: Neck supple. No tenderness.  Skin:    General: Skin is warm and dry.  Neurological:     Mental Status: She is alert.  Psychiatric:        Mood and Affect: Mood normal.     (all labs ordered are listed, but only abnormal results are displayed) Labs Reviewed  BETA-HYDROXYBUTYRIC ACID - Abnormal; Notable for the following components:      Result Value   Beta-Hydroxybutyric Acid 2.10 (*)    All other components within normal limits  BASIC METABOLIC PANEL WITH GFR - Abnormal; Notable for the following components:   Sodium 126 (*)    Potassium 6.1 (*)    Chloride 89 (*)    CO2 16 (*)    Glucose, Bld 1,157 (*)    BUN 55 (*)    Creatinine, Ser 8.83 (*)    Calcium  8.2 (*)    GFR, Estimated 6 (*)    Anion gap 21 (*)    All other components within normal limits  CBC WITH DIFFERENTIAL/PLATELET - Abnormal; Notable for the following components:   RBC 3.63 (*)    Hemoglobin 10.0 (*)    HCT  34.3 (*)    MCHC 29.2 (*)    RDW 16.8 (*)    Eosinophils Absolute 0.6 (*)    All other components within normal limits  CBG MONITORING, ED - Abnormal; Notable for the following components:   Glucose-Capillary >600 (*)    All other components within normal limits  CBG MONITORING, ED - Abnormal; Notable for the following components:   Glucose-Capillary >600 (*)    All other components within normal limits  I-STAT CHEM 8, ED - Abnormal; Notable for the following components:   Sodium 125 (*)  Potassium 6.0 (*)    Chloride 96 (*)    BUN 53 (*)    Creatinine, Ser 8.50 (*)    Glucose, Bld >700 (*)    Calcium , Ion 1.01 (*)    TCO2 17 (*)    All other components within normal limits  I-STAT VENOUS BLOOD GAS, ED - Abnormal; Notable for the following components:   pH, Ven 7.249 (*)    pCO2, Ven 38.4 (*)    pO2, Ven 79 (*)    Bicarbonate 16.8 (*)    TCO2 18 (*)    Acid-base deficit 10.0 (*)    Sodium 124 (*)    Potassium 6.0 (*)    Calcium , Ion 1.02 (*)    HCT 35.0 (*)    Hemoglobin 11.9 (*)    All other components within normal limits  HCG, SERUM, QUALITATIVE  URINALYSIS, ROUTINE W REFLEX MICROSCOPIC  OSMOLALITY  I-STAT CG4 LACTIC ACID, ED  I-STAT CG4 LACTIC ACID, ED    EKG: None  Radiology: DG Chest Portable 1 View Result Date: 03/16/2024 CLINICAL DATA:  Chest pain, hyperglycemia. EXAM: PORTABLE CHEST 1 VIEW COMPARISON:  03/08/2024 and CT chest 07/15/2023. FINDINGS: Trachea is midline. Heart is enlarged. Lungs are low in volume with mild interstitial prominence and indistinctness. No pleural fluid. A vascular stent is partially imaged in the proximal right upper arm. IMPRESSION: Mild pulmonary edema. Electronically Signed   By: Newell Eke M.D.   On: 03/16/2024 17:44     .Critical Care  Performed by: Pamella Ozell LABOR, DO Authorized by: Pamella Ozell LABOR, DO   Critical care provider statement:    Critical care time (minutes):  80   Critical care was necessary to  treat or prevent imminent or life-threatening deterioration of the following conditions:  Endocrine crisis   Critical care was time spent personally by me on the following activities:  Development of treatment plan with patient or surrogate, discussions with consultants, evaluation of patient's response to treatment, examination of patient, ordering and review of laboratory studies, ordering and review of radiographic studies, ordering and performing treatments and interventions, pulse oximetry, re-evaluation of patient's condition, review of old charts and obtaining history from patient or surrogate   I assumed direction of critical care for this patient from another provider in my specialty: no     Care discussed with: admitting provider   Comments:     Discussed with nephrology, ICU and hospitalist service .Ultrasound ED Peripheral IV (Provider)  Date/Time: 03/16/2024 4:19 PM  Performed by: Pamella Ozell LABOR, DO Authorized by: Pamella Ozell LABOR, DO   Procedure details:    Indications: multiple failed IV attempts and poor IV access     Skin Prep: chlorhexidine  gluconate     Location: Left upper arm.   Angiocath:  18 G   Bedside Ultrasound Guided: Yes     Images: archived     Patient tolerated procedure without complications: Yes     Dressing applied: No      Medications Ordered in the ED  hydrOXYzine  (ATARAX ) tablet 10 mg (has no administration in time range)  insulin  regular, human (MYXREDLIN ) 100 units/ 100 mL infusion (5.5 Units/hr Intravenous New Bag/Given 03/16/24 1843)  lactated ringers  infusion (0 mLs Intravenous Stopped 03/16/24 1848)  dextrose  5 % in lactated ringers  infusion (has no administration in time range)  dextrose  50 % solution 0-50 mL (has no administration in time range)  ondansetron  (ZOFRAN ) injection 4 mg (has no administration in time range)  calcium  gluconate 1  g/ 50 mL sodium chloride  IVPB (1,000 mg Intravenous New Bag/Given 03/16/24 1851)  sodium zirconium  cyclosilicate (LOKELMA ) packet 10 g (has no administration in time range)  morphine  (PF) 2 MG/ML injection 4 mg (4 mg Intravenous Given 03/16/24 1633)  lactated ringers  bolus 1,250 mL (1,250 mLs Intravenous New Bag/Given 03/16/24 1632)    Clinical Course as of 03/16/24 1919  Mon Mar 16, 2024  1831 Labs consistent with DKA hyperkalemia of 6.1.  Will order calcium  gluconate Lokelma .  Discussed with Dr. Gordy patel nephrology who recommends holding off on additional fluids and initiating insulin  drip for DKA and hyperkalemia treatment.  Will repeat metabolic panel in 4 to 6 hours to see if patient needs dialysis tonight or can wait until tomorrow. [MP]  1850 Discussed with ICU provider on-call Deward Eastern, NP.  Based on clinical status and workup thus far patient does not require ICU admission.  Will page to hospitalist [MP]  1918 Discussed with admitting hospitalist Dr. Tobie who accepts patient for admission [MP]    Clinical Course User Index [MP] Pamella Ozell LABOR, DO                                 Medical Decision Making 33 year old female with history as above presenting in DKA.  Syncopal episode prior to arrival.  Initial blood glucose over 600 here.  Hypertensive afebrile tachypneic.  Awake alert oriented.  Reporting diffuse body pain.  She was in the ED over 1 hour without IV access.  I establish IV access in the left upper arm using ultrasound guidance.  Will initiate fluid resuscitation then insulin  drip.  Will hold off on potassium repletion until initial result have returned given concern for severe electrolyte abnormalities such as hyperkalemia in the setting of ESRD with missed dialysis.  Once we have her stabilized we will reach out to nephrology team plan for admission.  Risk Prescription drug management.        Final diagnoses:  Diabetic ketoacidosis without coma associated with type 1 diabetes mellitus (HCC)  Hyperkalemia  ESRD on dialysis [facility])    ED Discharge Orders      None          Pamella Ozell LABOR, DO 03/16/24 1919

## 2024-03-16 NOTE — Hospital Course (Signed)
 Ashley Freeman is a 33 y.o. female with medical history significant for ESRD on TTS HD, chronic HFrEF (EF 30-35%), T1DM, HTN, anemia of chronic disease, HLD, BPD, right neck abscess s/p I&D 02/23/2024 who is admitted with DKA.

## 2024-03-16 NOTE — ED Provider Triage Note (Signed)
 Emergency Medicine Provider Triage Evaluation Note  Ashley Freeman , a 33 y.o. female  was evaluated in triage.  Pt complains of decreased LOC, hyperglycemia.  History of type 1 diabetes.  Global pain..  Review of Systems  Positive: As above Negative:   Physical Exam  BP (!) 179/112 (BP Location: Left Arm)   Pulse 100   Temp 97.7 F (36.5 C)   Resp 19   SpO2 97%  Gen:   Awake, in acute distress Resp:  Normal effort tachypneic MSK:   Moves extremities without difficulty  Other:    Medical Decision Making  Medically screening exam initiated at 2:45 PM.  Appropriate orders placed.  Ashley Freeman was informed that the remainder of the evaluation will be completed by another provider, this initial triage assessment does not replace that evaluation, and the importance of remaining in the ED until their evaluation is complete.  Initial hypoglycemia order set placed.   Ashley Freeman, [place] 03/16/24 1446

## 2024-03-16 NOTE — ED Notes (Signed)
 Cm 8 results to paden b.rn by at

## 2024-03-16 NOTE — ED Triage Notes (Addendum)
 Pt hyperglycemic CBG over 600. Pt had LOC on way to hospital, had to be sternal rubbed by first nurse to respond. Pt c/o all over pain.  Also said she was told by dialysis nurse she needs emergency dialysis, normally [facility]Th[facility] but missed Saturday.

## 2024-03-16 NOTE — H&P (Signed)
 History and Physical    PIERA Freeman FMW:[id number] DOB: [birth year] DOA: 03/16/2024  PCP: Keven Crumbly Pap, MD  Patient coming from: Home  I have personally briefly reviewed patient's old medical records in [facility]  Chief Complaint: Hyperglycemia  HPI: Ashley Freeman is a 33 y.o. female with medical history significant for ESRD on TTS HD, chronic HFrEF (EF 30-35%), T1DM, HTN, anemia of chronic disease, HLD, BPD, right neck abscess s/p I&D 02/23/2024 who presented to the ED for evaluation of hyperglycemia and dialysis needs.  Patient came to the ED for hyperglycemia, CBG over 600.  She was brought by her mom.  She reportedly lost consciousness on the way to the hospital.  On arrival she had to be sternal rubbed to respond.  She says she is having pain all over her body.  She is complaining of significantly dry mouth.  She states that she usually dialyzes on TTS scheduled but missed Saturday due to not having a ride.  Last dialysis was on Thursday 11/13.  She states that she still makes fair amount of urine.  She reports last bowel movement this morning.  She denies nausea and vomiting.  Patient states that she takes 10 units long-acting insulin  daily and 5 units short acting with meals and reports that she has been adherent.  Patient was last admitted 10/23-10/31 for DKA in setting of right neck abscess.  She underwent I&D by ENT on 02/23/2024.  She completed total 2-week antibiotic course with IV Ancef .  ED Course  Labs/Imaging on admission: I have personally reviewed following labs and imaging studies.  Initial vitals showed BP 179/112, pulse 100, RR 19, temp 97.7 F, SpO2 97% on room air.  Labs showed initial CBG >600.  Serum glucose 1157, bicarb 16, anion gap 21, beta hydroxybutyrate 2.10, potassium 6.1, BUN 55, creatinine 8.83.  Sodium 126 (151 when corrected for hyperglycemia).  WBC 8.1, hemoglobin 10.0, platelets 265.  Serum hCG negative,  lactic acid 1.6.  VBG pH 7.249, pCO2 38.4, pO2 79.  Portable chest x-ray showed mild pulmonary edema.  EKG ordered but not yet completed.  Patient was given 1.25 L LR, IV morphine  4 mg, IV calcium  gluconate 1 g, oral Lokelma  10 g, and started on IV insulin  infusion.  EDP spoke with nephrology Dr. Gordy Blanch who recommended holding further fluids, continue IV insulin , and follow repeat potassium level to determine if patient needs urgent dialysis tonight versus tomorrow.  EDP also spoke with PCCM who felt patient was stable for medical admission.  The hospitalist service was consulted for admission.  Review of Systems: All systems reviewed and are negative except as documented in history of present illness above.   Past Medical History:  Diagnosis Date   Abscess, gluteal, right 08/24/2013   Anemia 02/19/2012   Bartholin's gland abscess 09/19/2013   Bipolar disorder (HCC)    BV (bacterial vaginosis) 11/24/2015   Depression    Diabetes mellitus type I (HCC) 2001   Diagnosed at age 64 ; Type I   Diarrhea 05/30/2016   DKA (diabetic ketoacidoses) 08/19/2013   Also in 2018   ESRD (end stage renal disease) (HCC)    Gonorrhea 08/2011   Treated in 09/2011   HFrEF (heart failure with reduced ejection fraction) (HCC)    a. 2022 Echo: EF 40%; b. 10/2021 Echo: EF 55%; b. 07/2022 MV: No ischemia. EF 31%; c. 08/2022 Echo: EF 35%, mildly dil RV, sev TR.   History of trichomoniasis 05/31/2016   Hyperlipidemia  03/28/2016   Hypertension    NICM (nonischemic cardiomyopathy) (HCC)    Sepsis (HCC) 09/19/2013    Past Surgical History:  Procedure Laterality Date   A/V FISTULAGRAM Right 06/17/2023   Procedure: A/V Fistulagram;  Surgeon: Marea Selinda RAMAN, MD;  Location: [facility] INVASIVE CV LAB;  Service: Cardiovascular;  Laterality: Right;   A/V SHUNT INTERVENTION N/A 09/25/2023   Procedure: A/V SHUNT INTERVENTION;  Surgeon: Pearline Norman RAMAN, MD;  Location: [facility] LAB;  Service: Cardiovascular;  Laterality: N/A;    AV FISTULA PLACEMENT Right 07/06/2022   Procedure: ARTERIOVENOUS GRAFT CREATION;  Surgeon: Gretta Lonni PARAS, MD;  Location: [facility] OR;  Service: Vascular;  Laterality: Right;   CESAREAN SECTION N/A 10/05/2019   Procedure: CESAREAN SECTION;  Surgeon: Izell Harari, MD;  Location: [facility] LD ORS;  Service: Obstetrics;  Laterality: N/A;   CHOLECYSTECTOMY N/A 07/02/2023   Procedure: LAPAROSCOPIC CHOLECYSTECTOMY;  Surgeon: Ebbie Cough, MD;  Location: [facility] OR;  Service: General;  Laterality: N/A;   ESOPHAGOGASTRODUODENOSCOPY N/A 01/31/2024   Procedure: EGD (ESOPHAGOGASTRODUODENOSCOPY);  Surgeon: San Sandor GAILS, DO;  Location: [facility] ENDOSCOPY;  Service: Gastroenterology;  Laterality: N/A;   INCISION AND DRAINAGE ABSCESS Left 09/28/2019   Procedure: INCISION AND DRAINAGE VULVAR ABCESS;  Surgeon: Edsel Norleen GAILS, MD;  Location: [facility] OR;  Service: Gynecology;  Laterality: Left;   INCISION AND DRAINAGE ABSCESS Right 02/23/2024   Procedure: INCISION AND DRAINAGE, ABSCESS;  Surgeon: Tobie Eldora NOVAK, MD;  Location: [facility] OR;  Service: ENT;  Laterality: Right;   INCISION AND DRAINAGE PERIRECTAL ABSCESS Right 08/18/2013   Procedure: IRRIGATION AND DEBRIDEMENT GLUTEAL ABSCESS;  Surgeon: Lynda Leos, MD;  Location: [facility] OR;  Service: General;  Laterality: Right;   INCISION AND DRAINAGE PERIRECTAL ABSCESS Right 09/19/2013   Procedure: IRRIGATION AND DEBRIDEMENT RIGHT GLUTEAL AND LABIAL ABSCESSES;  Surgeon: Lynda Leos, MD;  Location: [facility] OR;  Service: General;  Laterality: Right;   INCISION AND DRAINAGE PERIRECTAL ABSCESS Right 09/24/2013   Procedure: IRRIGATION AND DEBRIDEMENT PERIRECTAL ABSCESS;  Surgeon: Lynwood MALVA Pina, MD;  Location: [facility] OR;  Service: General;  Laterality: Right;   IR PARACENTESIS  08/28/2023   IR PARACENTESIS  11/04/2023   IR PARACENTESIS  12/23/2023   IR PARACENTESIS  02/04/2024    Social History: Social History   Tobacco Use   Smoking status: Never    Passive exposure: Never    Smokeless tobacco: Never  Vaping Use   Vaping status: Never Used  Substance Use Topics   Alcohol use: Not Currently   Drug use: No   Allergies  Allergen Reactions   Keflex  [Cephalexin ] Anaphylaxis    Ceftriaxone  in the past with no reaction   Penicillins Anaphylaxis, Hives and Rash   Vibramycin  [Doxycycline ] Anaphylaxis   Benadryl  [Diphenhydramine ] Itching   Dilaudid  [Hydromorphone ] Itching   Methotrexate And Trimetrexate Rash   Roxicodone  [Oxycodone ] Itching    Takes Percocet without issue    Family History  Problem Relation Age of Onset   Asthma Mother    Carpal tunnel syndrome Mother    Gout Father    Diabetes Paternal Grandmother    Anesthesia problems Neg Hx      Prior to Admission medications   Medication Sig Start Date End Date Taking? Authorizing Provider  albuterol  (VENTOLIN  HFA) 108 (90 Base) MCG/ACT inhaler Inhale 2 puffs into the lungs every 4 (four) hours as needed for wheezing or shortness of breath. 11/24/22   [provider]  bumetanide  (BUMEX ) 2 MG tablet Take 10 mg by mouth daily.  [provider]  calcitRIOL  (ROCALTROL ) 0.25 MCG capsule Take 5 capsules (1.25 mcg total) by mouth every Tuesday, Thursday, and Saturday at 6 PM. Patient not taking: Reported on 02/22/2024 07/09/23   Cindy Garnette POUR, MD  carvedilol  (COREG ) 25 MG tablet Take 25 mg by mouth 2 (two) times daily with a meal.    [provider]  DULoxetine  (CYMBALTA ) 20 MG capsule Take 20 mg by mouth daily. 07/29/23   [provider]  fluticasone  (FLONASE ) 50 MCG/ACT nasal spray Place 2 sprays into both nostrils daily. 12/11/23 12/10/24  [provider]  HYDROcodone -acetaminophen  (NORCO/VICODIN) 5-325 MG tablet Take 1-2 tablets by mouth every 6 (six) hours as needed for moderate pain (pain score 4-6) or severe pain (pain score 7-10). 02/28/24   Elgergawy, Brayton RAMAN, MD  hydrOXYzine  (ATARAX ) 25 MG tablet Take 25 mg by mouth 3 (three) times daily as needed for  anxiety, itching, nausea or vomiting.    [provider]  ketoconazole  (NIZORAL ) 2 % cream Apply 1 Application topically 2 (two) times daily. Apply 1gm twice daily to affected skin on feet 01/06/24   McCaughan, Dia D, DPM  lamoTRIgine  (LAMICTAL ) 200 MG tablet Take 1 tablet (200 mg total) by mouth daily. 10/29/23   Regalado, Belkys A, MD  LANTUS  SOLOSTAR 100 UNIT/ML Solostar Pen Inject 10 Units into the skin daily. 02/07/24   Drusilla Sabas RAMAN, MD  metoCLOPramide  (REGLAN ) 10 MG tablet Take 0.5 tablets (5 mg total) by mouth every 8 (eight) hours as needed for nausea or refractory nausea / vomiting. 02/07/24   Drusilla Sabas RAMAN, MD  NOVOLOG  FLEXPEN 100 UNIT/ML FlexPen INJECT 5 UNITS UNDER THE SKIN 3 TIMES A DAY BEFORE MEALS. IF YOU DO NOT EAT A MEAL, PLEASE CHEECK YOUR BLOOD SUGAR AND GIVE YOURSELF 1 UNIT OF INSULIN  FOR EVERY 50MG /DL GREATER THAN 869. MAX 30 UNITS PER DAY. 01/18/24   [provider]  OLANZapine  zydis (ZYPREXA ) 5 MG disintegrating tablet Take 5 mg by mouth at bedtime. 09/23/23 02/08/24  [provider]  pantoprazole  (PROTONIX ) 40 MG tablet Take 1 tablet (40 mg total) by mouth 2 (two) times daily. 02/07/24 04/10/24  Drusilla Sabas RAMAN, MD  prochlorperazine  (COMPAZINE ) 5 MG tablet Take 5 mg by mouth every 6 (six) hours as needed for nausea or vomiting.    [provider]  rosuvastatin  (CRESTOR ) 40 MG tablet Take 40 mg by mouth daily. 01/20/24 01/19/25  [provider]  sacubitril-valsartan (ENTRESTO) 24-26 MG Take 1 tablet by mouth 2 (two) times daily.    [provider]  sevelamer  carbonate (RENVELA ) 800 MG tablet Take 1 tablet (800 mg total) by mouth 3 (three) times daily with meals. 02/07/24   Drusilla Sabas RAMAN, MD  sodium bicarbonate  650 MG tablet Take 650 mg by mouth 2 (two) times daily. 07/29/23   [provider]  sucralfate  (CARAFATE ) 1 GM/10ML suspension Take 10 mLs (1 g total) by mouth 2 (two) times daily for 14 days. 02/07/24 02/24/24  Drusilla Sabas RAMAN, MD  SUMAtriptan  (IMITREX ) 50 MG tablet Take 50 mg by mouth every 2 (two) hours as needed for migraine or headache. 06/20/22   [provider]    Physical Exam: Vitals:   03/16/24 1430 03/16/24 1835  BP: (!) 179/112 (!) 159/104  Pulse: 100   Resp: 19 (!) 24  Temp: 97.7 F (36.5 C)   SpO2: 97% 98%   Constitutional: Chronically ill-appearing young woman resting in bed in the left lateral decubitus position.  Eyes: EOMI,  lids and conjunctivae normal ENMT: Mucous membranes are dry. Posterior pharynx clear of any exudate or lesions.Normal dentition.  Neck: normal, supple, no masses. Respiratory: clear to auscultation bilaterally, no wheezing, no crackles. Normal respiratory effort. No accessory muscle use.  Cardiovascular: Tachycardic, no murmurs / rubs / gallops. No extremity edema. 2+ pedal pulses. Abdomen: no tenderness, no masses palpated. Musculoskeletal: no clubbing / cyanosis. No joint deformity upper and lower extremities. Good ROM, no contractures. Normal muscle tone.  Skin: no rashes, lesions, ulcers. No induration Neurologic: Sensation intact. Strength 5/5 in all 4.  Psychiatric:  Alert and oriented x 3.  Anxious mood.   EKG: Ordered and pending.  Assessment/Plan Principal Problem:   Diabetic ketoacidosis associated with type 1 diabetes mellitus (HCC) Active Problems:   ESRD on hemodialysis (HCC)   Hyperkalemia   Anemia due to chronic kidney disease   HFrEF (heart failure with reduced ejection fraction) (HCC)   Hypertension associated with diabetes (HCC)   Bipolar disorder (HCC)   Ashley Freeman is a 33 y.o. female with medical history significant for ESRD on TTS HD, chronic HFrEF (EF 30-35%), T1DM, HTN, anemia of chronic disease, HLD, BPD, right neck abscess s/p I&D 02/23/2024 who is admitted with DKA.  Assessment and Plan: Diabetic ketoacidosis in uncontrolled type 1 diabetes: - Continue IV insulin  infusion per protocol - Hold further  maintenance LR given history of ESRD/HFrEF - Placed on low rate D5 LR 50 mL/hour when CBG <250 - Follow serial BMET and transition to SQ insulin  when able - Consulted diabetes coordinator - NPO except sips/meds for now  ESRD on TTS HD: Reported last HD was on 11/13 after missing dialysis on Saturday.  Potassium elevated but anticipate improvement with IV insulin .  EDP discussed with nephrology, Dr. Tobie, who is aware.  If potassium remains elevated on repeat labs then may need more urgent dialysis otherwise will be stable for dialysis tomorrow.  Hyperkalemia: Given Lokelma  in the ED and on IV insulin  infusion.  Follow repeat labs.  Chronic HFrEF: TTE 08/29/2023 showed EF 30-35%, LV global hypokinesis, G2DD.  At the moment does not appear grossly volume overloaded. - Holding maintenance fluids as above - Continue Coreg  25 mg twice daily - Holding Entresto given hyperkalemia - Strict I/O's and daily weights  Hypertension: Continue Coreg , holding Entresto.  IV hydralazine  ordered as needed.  Anemia of chronic disease: Hemoglobin stable.  Continue to monitor.  Hyperlipidemia: Continue rosuvastatin .  Bipolar disorder: Continue Lamictal , Cymbalta , Atarax  as needed.   DVT prophylaxis: heparin  injection 5,000 Units Start: 03/16/24 2200 Code Status: Full code Family Communication: Discussed with patient, she has discussed with family Disposition Plan: From home, dispo pending clinical progress Consults called: Nephrology Severity of Illness: The appropriate patient status for this patient is INPATIENT. Inpatient status is judged to be reasonable and necessary in order to provide the required intensity of service to ensure the patient's safety. The patient's presenting symptoms, physical exam findings, and initial radiographic and laboratory data in the context of their chronic comorbidities is felt to place them at high risk for further clinical deterioration. Furthermore, it is not  anticipated that the patient will be medically stable for discharge from the hospital within 2 midnights of admission.   * I certify that at the point of admission it is my clinical judgment that the patient will require inpatient hospital care spanning beyond 2 midnights from the point of admission due to high intensity of service, high risk for further deterioration and high frequency of  surveillance required.DEWAINE Jorie Blanch MD [facility]  If 7PM-7AM, please contact night-coverage [email or web address]  03/16/2024, 8:29 PM

## 2024-03-16 NOTE — ED Notes (Signed)
 CCMD called for cardiac monitoring.

## 2024-03-17 DIAGNOSIS — E1010 Type 1 diabetes mellitus with ketoacidosis without coma: Secondary | ICD-10-CM | POA: Diagnosis not present

## 2024-03-17 LAB — GLUCOSE, CAPILLARY
Glucose-Capillary: 91 mg/dL (ref 70–99)
Glucose-Capillary: 101 mg/dL — ABNORMAL HIGH (ref 70–99)

## 2024-03-17 LAB — BASIC METABOLIC PANEL WITH GFR
Anion gap: 23 — ABNORMAL HIGH (ref 5–15)
Anion gap: 20 — ABNORMAL HIGH (ref 5–15)
Anion gap: 15 (ref 5–15)
BUN: 57 mg/dL — ABNORMAL HIGH (ref 6–20)
BUN: 26 mg/dL — ABNORMAL HIGH (ref 6–20)
BUN: 26 mg/dL — ABNORMAL HIGH (ref 6–20)
CO2: 14 mmol/L — ABNORMAL LOW (ref 22–32)
CO2: 20 mmol/L — ABNORMAL LOW (ref 22–32)
CO2: 23 mmol/L (ref 22–32)
Calcium: 8.8 mg/dL — ABNORMAL LOW (ref 8.9–10.3)
Calcium: 8.7 mg/dL — ABNORMAL LOW (ref 8.9–10.3)
Calcium: 8.5 mg/dL — ABNORMAL LOW (ref 8.9–10.3)
Chloride: 95 mmol/L — ABNORMAL LOW (ref 98–111)
Chloride: 94 mmol/L — ABNORMAL LOW (ref 98–111)
Chloride: 98 mmol/L (ref 98–111)
Creatinine, Ser: 9.18 mg/dL — ABNORMAL HIGH (ref 0.44–1.00)
Creatinine, Ser: 4.55 mg/dL — ABNORMAL HIGH (ref 0.44–1.00)
Creatinine, Ser: 5.13 mg/dL — ABNORMAL HIGH (ref 0.44–1.00)
GFR, Estimated: 5 mL/min — ABNORMAL LOW (ref >60)
GFR, Estimated: 13 mL/min — ABNORMAL LOW (ref >60)
GFR, Estimated: 11 mL/min — ABNORMAL LOW (ref >60)
Glucose, Bld: 479 mg/dL — ABNORMAL HIGH (ref 70–99)
Glucose, Bld: 111 mg/dL — ABNORMAL HIGH (ref 70–99)
Glucose, Bld: 72 mg/dL (ref 70–99)
Potassium: 4.7 mmol/L (ref 3.5–5.1)
Potassium: 3.6 mmol/L (ref 3.5–5.1)
Potassium: 4.2 mmol/L (ref 3.5–5.1)
Sodium: 132 mmol/L — ABNORMAL LOW (ref 135–145)
Sodium: 134 mmol/L — ABNORMAL LOW (ref 135–145)
Sodium: 136 mmol/L (ref 135–145)

## 2024-03-17 LAB — CBC
HCT: 29.8 % — ABNORMAL LOW (ref 36.0–46.0)
Hemoglobin: 9.6 g/dL — ABNORMAL LOW (ref 12.0–15.0)
MCH: 27.6 pg (ref 26.0–34.0)
MCHC: 32.2 g/dL (ref 30.0–36.0)
MCV: 85.6 fL (ref 80.0–100.0)
Platelets: 291 K/uL (ref 150–400)
RBC: 3.48 MIL/uL — ABNORMAL LOW (ref 3.87–5.11)
RDW: 16.3 % — ABNORMAL HIGH (ref 11.5–15.5)
WBC: 16.1 K/uL — ABNORMAL HIGH (ref 4.0–10.5)
nRBC: 0 % (ref 0.0–0.2)

## 2024-03-17 LAB — CBG MONITORING, ED
Glucose-Capillary: 113 mg/dL — ABNORMAL HIGH (ref 70–99)
Glucose-Capillary: 155 mg/dL — ABNORMAL HIGH (ref 70–99)
Glucose-Capillary: 234 mg/dL — ABNORMAL HIGH (ref 70–99)
Glucose-Capillary: 316 mg/dL — ABNORMAL HIGH (ref 70–99)
Glucose-Capillary: 383 mg/dL — ABNORMAL HIGH (ref 70–99)
Glucose-Capillary: 428 mg/dL — ABNORMAL HIGH (ref 70–99)
Glucose-Capillary: 429 mg/dL — ABNORMAL HIGH (ref 70–99)
Glucose-Capillary: 550 mg/dL (ref 70–99)
Glucose-Capillary: 562 mg/dL (ref 70–99)

## 2024-03-17 LAB — BETA-HYDROXYBUTYRIC ACID: Beta-Hydroxybutyric Acid: 0.09 mmol/L (ref 0.05–0.27)

## 2024-03-17 MED ORDER — INSULIN GLARGINE-YFGN 100 UNIT/ML ~~LOC~~ SOLN
10.0000 [IU] | SUBCUTANEOUS | Status: DC
  Administered 2024-03-17: 10 [IU] via SUBCUTANEOUS
  Filled 2024-03-17: qty 0.1

## 2024-03-17 MED ORDER — HEPARIN SODIUM (PORCINE) 1000 UNIT/ML DIALYSIS: 40.0000 [IU]/kg | INTRAMUSCULAR | Status: DC | PRN

## 2024-03-17 MED ORDER — HYDROMORPHONE HCL 1 MG/ML IJ SOLN
INTRAMUSCULAR | Status: AC
  Filled 2024-03-17: qty 1

## 2024-03-17 MED ORDER — HYDROXYZINE HCL 50 MG/ML IM SOLN
25.0000 mg | INTRAMUSCULAR | Status: DC | PRN
Start: 2024-03-17 — End: 2024-03-18

## 2024-03-17 MED ORDER — LIDOCAINE-PRILOCAINE 2.5-2.5 % EX CREA: 1.0000 | TOPICAL_CREAM | CUTANEOUS | Status: DC | PRN

## 2024-03-17 MED ORDER — BUMETANIDE 2 MG PO TABS
10.0000 mg | ORAL_TABLET | Freq: Every day | ORAL | Status: DC
  Administered 2024-03-18: 10 mg via ORAL
  Filled 2024-03-17: qty 5

## 2024-03-17 MED ORDER — ALTEPLASE 2 MG IJ SOLR: 2.0000 mg | Freq: Once | INTRAMUSCULAR | Status: DC | PRN

## 2024-03-17 MED ORDER — INSULIN GLARGINE-YFGN 100 UNIT/ML ~~LOC~~ SOLN
5.0000 [IU] | SUBCUTANEOUS | Status: DC
  Administered 2024-03-18: 5 [IU] via SUBCUTANEOUS
  Filled 2024-03-17: qty 0.05

## 2024-03-17 MED ORDER — ACETAMINOPHEN 500 MG PO TABS
500.0000 mg | ORAL_TABLET | Freq: Four times a day (QID) | ORAL | Status: DC | PRN
  Administered 2024-03-17: 500 mg via ORAL
  Filled 2024-03-17: qty 1

## 2024-03-17 MED ORDER — INSULIN ASPART 100 UNIT/ML IJ SOLN: 0.0000 [IU] | Freq: Every day | INTRAMUSCULAR | Status: DC

## 2024-03-17 MED ORDER — INSULIN ASPART 100 UNIT/ML IJ SOLN: 3.0000 [IU] | Freq: Three times a day (TID) | INTRAMUSCULAR | Status: DC

## 2024-03-17 MED ORDER — INSULIN ASPART 100 UNIT/ML IJ SOLN
0.0000 [IU] | Freq: Three times a day (TID) | INTRAMUSCULAR | Status: DC
  Administered 2024-03-18: 1 [IU] via SUBCUTANEOUS
  Filled 2024-03-17: qty 1

## 2024-03-17 MED ORDER — DIPHENHYDRAMINE HCL 50 MG/ML IJ SOLN: 25.0000 mg | INTRAMUSCULAR | Status: DC | PRN

## 2024-03-17 MED ORDER — LIDOCAINE-PRILOCAINE 2.5-2.5 % EX CREA
1.0000 | TOPICAL_CREAM | CUTANEOUS | Status: DC | PRN
Start: 2024-03-17 — End: 2024-03-17

## 2024-03-17 MED ORDER — HEPARIN SODIUM (PORCINE) 1000 UNIT/ML DIALYSIS: 1000.0000 [IU] | INTRAMUSCULAR | Status: DC | PRN

## 2024-03-17 MED ORDER — ANTICOAGULANT SODIUM CITRATE 4% (200MG/5ML) IV SOLN: 5.0000 mL | Status: DC | PRN

## 2024-03-17 MED ORDER — HYDROCODONE-ACETAMINOPHEN 5-325 MG PO TABS
1.0000 | ORAL_TABLET | Freq: Four times a day (QID) | ORAL | Status: DC | PRN
  Administered 2024-03-17: 1 via ORAL
  Administered 2024-03-18: 2 via ORAL
  Filled 2024-03-17: qty 2
  Filled 2024-03-17: qty 1

## 2024-03-17 MED ORDER — PENTAFLUOROPROP-TETRAFLUOROETH EX AERO: 1.0000 | INHALATION_SPRAY | CUTANEOUS | Status: DC | PRN

## 2024-03-17 MED ORDER — LIDOCAINE HCL (PF) 1 % IJ SOLN: 5.0000 mL | INTRAMUSCULAR | Status: DC | PRN

## 2024-03-17 NOTE — Progress Notes (Signed)
 [facility] Progress Note Patient: Ashley Freeman FMW:[id number] DOB: [birth year]  DOA: 03/16/2024 DOS: the patient was seen and examined on 03/17/2024  Brief Hospital Course: MARQUEL SPOTO is a 33 y.o. female with medical history significant for ESRD on TTS HD, chronic HFrEF (EF 30-35%), T1DM, HTN, anemia of chronic disease, HLD, BPD, right neck abscess s/p I&D 02/23/2024 who is admitted with DKA.  Assessment and Plan: Diabetic ketoacidosis in uncontrolled type 1 diabetes: Continue IV insulin  infusion per protocol Anion gap closed. Transition to sliding scale insulin . Home regimen appears to be around 8 units of long-acting with 6 units of Premeal coverage. Etiology of the DKA is not clear.  Potential noncompliance.   ESRD on TTS HD: Reported last HD was on 11/13 after missing dialysis on Saturday.  Potassium elevated but anticipate improvement with IV insulin . Underwent HD on 11/18. 3.5 L removed. Monitor improvement.   Hyperkalemia: Given Lokelma  in the ED and on IV insulin  infusion.  Follow repeat labs.   Chronic HFrEF: TTE 08/29/2023 showed EF 30-35%, LV global hypokinesis, G2DD.  At the moment does not appear grossly volume overloaded. - Continue Coreg  25 mg twice daily - Holding Entresto given hyperkalemia.  Also holding diuretic for now.  Resume tomorrow. - Strict I/O's and daily weights   Hypertension: Continue Coreg , holding Entresto.  IV hydralazine  ordered as needed.   Anemia of chronic disease: Hemoglobin stable.  Continue to monitor.   Hyperlipidemia: Continue rosuvastatin .   Bipolar disorder: Continue Lamictal , Cymbalta , Atarax  as needed.  Subjective: Reports generalized body ache.  No nausea no vomiting no fever no chills.  No shortness of breath unable to tell me if she has any eye pain or itching or not.  Physical Exam: Alert awake and oriented x 3. Seen in hemodialysis. S1-S2 present.  Aortic murmur. Bowel sound  present Nontender. No edema. Periorbital edema bilaterally seen with crusting.  No erythema.  Data Reviewed: I have Reviewed nursing notes, Vitals, and Lab results. Since last encounter, pertinent lab results CBC and BMP   . I have ordered test including CBC and BMP  .   Disposition: Status is: Inpatient Remains inpatient appropriate because: Monitor for improvement in sugar levels.  heparin  injection 5,000 Units Start: 03/16/24 2200   Family Communication: No one at bedside Level of care: Progressive   Vitals:   03/17/24 1124 03/17/24 1127 03/17/24 1130 03/17/24 1223  BP: (!) 192/104 (!) 179/89 (!) 179/89   Pulse: 98 98 94   Resp: 10 10  18   Temp: 98.6 F (37 C)   98.1 F (36.7 C)  TempSrc: Oral   Oral  SpO2: 95% 98% 98%      Author: Yetta Blanch, MD 03/17/2024 4:07 PM  Please look on [email or web address] to find out who is on call.

## 2024-03-17 NOTE — Consult Note (Signed)
 Renal Service Consult Note [facility] Ashley JONETTA Fret, MD  Patient: Ashley Freeman Date: 03/17/2024 Requesting Physician: Dr. Tobie, P.   Reason for Consult: ESRD pt w/ LOC and high BS HPI: The patient is a 33 y.o. year-old w/ PMH as below who presented to ED brought by her mother for CBG > 600 at home. Pt required sternal rub. Then c/o pain all over her body.  Missed her HD on Sat (TTS). Last HD was 11/13. No n/v. In ED BP 179/112, HR 100, RR 18, temp 97, O2 sat 97% on RA. Labs showed glu 1157, CO2 16, AF 21, BHB 2.1, K+ 6.1, bun 55, creat 8.8.  CXR showed early IS edema. Pt rec'd IV LR 12.5L, IV morphine , IV ca gluc, po lokelma  and IV insulin  infusion. Nephrology on-call was notified and scheduled HD for 1st shift today. Pt was admitted. We are asked to consult for ESRD.    Pt seen in HD unit. Poor historian, lethargic. Asking for itching medication.    ROS - denies CP, no joint pain, no HA   Past Medical History  Past Medical History:  Diagnosis Date   Abscess, gluteal, right 08/24/2013   Anemia 02/19/2012   Bartholin's gland abscess 09/19/2013   Bipolar disorder (HCC)    BV (bacterial vaginosis) 11/24/2015   Depression    Diabetes mellitus type I (HCC) 2001   Diagnosed at age 49 ; Type I   Diarrhea 05/30/2016   DKA (diabetic ketoacidoses) 08/19/2013   Also in 2018   ESRD (end stage renal disease) (HCC)    Gonorrhea 08/2011   Treated in 09/2011   HFrEF (heart failure with reduced ejection fraction) (HCC)    a. 2022 Echo: EF 40%; b. 10/2021 Echo: EF 55%; b. 07/2022 MV: No ischemia. EF 31%; c. 08/2022 Echo: EF 35%, mildly dil RV, sev TR.   History of trichomoniasis 05/31/2016   Hyperlipidemia 03/28/2016   Hypertension    NICM (nonischemic cardiomyopathy) (HCC)    Sepsis (HCC) 09/19/2013   Past Surgical History  Past Surgical History:  Procedure Laterality Date   A/V FISTULAGRAM Right 06/17/2023   Procedure: A/V Fistulagram;  Surgeon: Ashley Selinda GORMAN, MD;  Location: [facility] INVASIVE CV LAB;  Service: Cardiovascular;  Laterality: Right;   A/V SHUNT INTERVENTION N/A 09/25/2023   Procedure: A/V SHUNT INTERVENTION;  Surgeon: Ashley Norman GORMAN, MD;  Location: [facility] PV LAB;  Service: Cardiovascular;  Laterality: N/A;   AV FISTULA PLACEMENT Right 07/06/2022   Procedure: ARTERIOVENOUS GRAFT CREATION;  Surgeon: Ashley Lonni PARAS, MD;  Location: [facility] OR;  Service: Vascular;  Laterality: Right;   CESAREAN SECTION N/A 10/05/2019   Procedure: CESAREAN SECTION;  Surgeon: Ashley Harari, MD;  Location: [facility] ORS;  Service: Obstetrics;  Laterality: N/A;   CHOLECYSTECTOMY N/A 07/02/2023   Procedure: LAPAROSCOPIC CHOLECYSTECTOMY;  Surgeon: Ashley Cough, MD;  [facility];  Service: General;  Laterality: N/A;   ESOPHAGOGASTRODUODENOSCOPY N/A 01/31/2024   Procedure: EGD (ESOPHAGOGASTRODUODENOSCOPY);  Surgeon: Ashley Sandor GAILS, DO;  Location: [facility] ENDOSCOPY;  Service: Gastroenterology;  Laterality: N/A;   INCISION AND DRAINAGE ABSCESS Left 09/28/2019   Procedure: INCISION AND DRAINAGE VULVAR ABCESS;  Surgeon: Ashley Norleen GAILS, MD;  Location: [facility] OR;  Service: Gynecology;  Laterality: Left;   INCISION AND DRAINAGE ABSCESS Right 02/23/2024   Procedure: INCISION AND DRAINAGE, ABSCESS;  Surgeon: Ashley Eldora NOVAK, MD;  Location: [facility] OR;  Service: ENT;  Laterality: Right;   INCISION AND DRAINAGE PERIRECTAL ABSCESS Right 08/18/2013  Procedure: IRRIGATION AND DEBRIDEMENT GLUTEAL ABSCESS;  Surgeon: Ashley Leos, MD;  Location: [facility] OR;  Service: General;  Laterality: Right;   INCISION AND DRAINAGE PERIRECTAL ABSCESS Right 09/19/2013   Procedure: IRRIGATION AND DEBRIDEMENT RIGHT GLUTEAL AND LABIAL ABSCESSES;  Surgeon: Ashley Leos, MD;  Location: [facility] OR;  Service: General;  Laterality: Right;   INCISION AND DRAINAGE PERIRECTAL ABSCESS Right 09/24/2013   Procedure: IRRIGATION AND DEBRIDEMENT PERIRECTAL ABSCESS;  Surgeon: Ashley MALVA Pina, MD;  Location: [facility] OR;   Service: General;  Laterality: Right;   IR PARACENTESIS  08/28/2023   IR PARACENTESIS  11/04/2023   IR PARACENTESIS  12/23/2023   IR PARACENTESIS  02/04/2024   Family History  Family History  Problem Relation Age of Onset   Asthma Mother    Carpal tunnel syndrome Mother    Gout Father    Diabetes Paternal Grandmother    Anesthesia problems Neg Hx    Social History  reports that she has never smoked. She has never been exposed to tobacco smoke. She has never used smokeless tobacco. She reports that she does not currently use alcohol. She reports that she does not use drugs. Allergies  Allergies  Allergen Reactions   Keflex  [Cephalexin ] Anaphylaxis    Ceftriaxone  in the past with no reaction   Penicillins Anaphylaxis, Hives and Rash   Vibramycin  [Doxycycline ] Anaphylaxis   Benadryl  [Diphenhydramine ] Itching   Dilaudid  [Hydromorphone ] Itching   Methotrexate And Trimetrexate Rash   Roxicodone  [Oxycodone ] Itching    Takes Percocet without issue   Home medications Prior to Admission medications   Medication Sig Start Date End Date Taking? Authorizing Provider  albuterol  (VENTOLIN  HFA) 108 (90 Base) MCG/ACT inhaler Inhale 2 puffs into the lungs every 4 (four) hours as needed for wheezing or shortness of breath. 11/24/22   [provider]  bumetanide  (BUMEX ) 2 MG tablet Take 10 mg by mouth daily.    [provider]  calcitRIOL  (ROCALTROL ) 0.25 MCG capsule Take 5 capsules (1.25 mcg total) by mouth every Tuesday, Thursday, and Saturday at 6 PM. Patient not taking: Reported on 02/22/2024 07/09/23   Cindy Garnette POUR, MD  carvedilol  (COREG ) 25 MG tablet Take 25 mg by mouth 2 (two) times daily with a meal.    [provider]  DULoxetine  (CYMBALTA ) 20 MG capsule Take 20 mg by mouth daily. 07/29/23   [provider]  fluticasone  (FLONASE ) 50 MCG/ACT nasal spray Place 2 sprays into both nostrils daily. 12/11/23 12/10/24  [provider]   HYDROcodone -acetaminophen  (NORCO/VICODIN) 5-325 MG tablet Take 1-2 tablets by mouth every 6 (six) hours as needed for moderate pain (pain score 4-6) or severe pain (pain score 7-10). 02/28/24   Elgergawy, Brayton RAMAN, MD  hydrOXYzine  (ATARAX ) 25 MG tablet Take 25 mg by mouth 3 (three) times daily as needed for anxiety, itching, nausea or vomiting.    [provider]  ketoconazole  (NIZORAL ) 2 % cream Apply 1 Application topically 2 (two) times daily. Apply 1gm twice daily to affected skin on feet 01/06/24   McCaughan, Dia D, DPM  lamoTRIgine  (LAMICTAL ) 200 MG tablet Take 1 tablet (200 mg total) by mouth daily. 10/29/23   Regalado, Belkys A, MD  LANTUS  SOLOSTAR 100 UNIT/ML Solostar Pen Inject 10 Units into the skin daily. 02/07/24   Drusilla Sabas RAMAN, MD  metoCLOPramide  (REGLAN ) 10 MG tablet Take 0.5 tablets (5 mg total) by mouth every 8 (eight) hours as needed for nausea or refractory nausea / vomiting. 02/07/24   Drusilla Sabas RAMAN, MD  NOVOLOG  FLEXPEN 100 UNIT/ML FlexPen INJECT 5 UNITS UNDER THE SKIN 3 TIMES A DAY BEFORE MEALS. IF YOU DO NOT EAT A MEAL, PLEASE CHEECK YOUR BLOOD SUGAR AND GIVE YOURSELF 1 UNIT OF INSULIN  FOR EVERY 50MG /DL GREATER THAN 869. MAX 30 UNITS PER DAY. 01/18/24   [provider]  OLANZapine  zydis (ZYPREXA ) 5 MG disintegrating tablet Take 5 mg by mouth at bedtime. 09/23/23 02/08/24  [provider]  pantoprazole  (PROTONIX ) 40 MG tablet Take 1 tablet (40 mg total) by mouth 2 (two) times daily. 02/07/24 04/10/24  Drusilla Sabas RAMAN, MD  prochlorperazine  (COMPAZINE ) 5 MG tablet Take 5 mg by mouth every 6 (six) hours as needed for nausea or vomiting.    [provider]  rosuvastatin  (CRESTOR ) 40 MG tablet Take 40 mg by mouth daily. 01/20/24 01/19/25  [provider]  sacubitril-valsartan (ENTRESTO) 24-26 MG Take 1 tablet by mouth 2 (two) times daily.    [provider]  sevelamer  carbonate (RENVELA ) 800 MG tablet Take 1 tablet (800 mg total) by mouth 3  (three) times daily with meals. 02/07/24   Drusilla Sabas RAMAN, MD  sodium bicarbonate  650 MG tablet Take 650 mg by mouth 2 (two) times daily. 07/29/23   [provider]  sucralfate  (CARAFATE ) 1 GM/10ML suspension Take 10 mLs (1 g total) by mouth 2 (two) times daily for 14 days. 02/07/24 03/16/24  Drusilla Sabas RAMAN, MD  SUMAtriptan  (IMITREX ) 50 MG tablet Take 50 mg by mouth every 2 (two) hours as needed for migraine or headache. 06/20/22   [provider]     Vitals:   03/17/24 0800 03/17/24 0830 03/17/24 0930 03/17/24 1000  BP: (!) 177/99 (!) 176/97 (!) 187/113 (!) 168/104  Pulse: 91 97 96 100  Resp: 19 14  19   Temp:      TempSrc:      SpO2: 96% 98% 99% 98%   Exam Gen alert, no distress, chronically ill appearing Sclera anicteric, throat clear  No jvd or bruits Chest clear bilat to bases RRR no RG Abd soft ntnd no mass or ascites +bs Ext 1+ bilat LE edema Neuro is alert, Ox 3 , nf    R AVF+bruit   Home bp meds: Bumex  10mg  /day Coreg  25mg  bid Entresto 24-26 mg bid    OP HD:  MWF [facility]) From Oct 2025 -> 3h  B450  61kg  AVG   Heparin  1000+ 600u/hr  CXR 11/17: mild pulm edema, sig CM   Assessment/ Plan: DKA: in uncontrolled DM type 1. Per pmd Hyperkalemia: temp measures given in ED, is on HD now.  ESRD: on HD MWF w/ [facility]. Not sure last OP HD. HD today in progress. HTN: bp's up a bit, cont home meds post HD today.  Volume: mild-mod LE edema, mild pulm edema by CXR. Max UF 4 -5 L.  Anemia of esrd: Hb 9-12 here, follow.        Myer Fret  MD [facility] 03/17/2024, 10:19 AM  Recent Labs  Lab 03/16/24 1622 03/16/24 2230 03/17/24 0130 03/17/24 0746  HGB 11.9*  12.2  --   --  9.6*  CALCIUM   --  8.1* 8.8*  --   CREATININE 8.50* 8.85* 9.18*  --   K 6.0*  6.0* 4.9 4.7  --    Inpatient medications:  carvedilol   25 mg Oral BID WC   Chlorhexidine  Gluconate Cloth  6 each Topical Q0600   DULoxetine   20 mg Oral Daily   heparin   5,000 Units Subcutaneous Q8H   lamoTRIgine   200 mg Oral Daily   pantoprazole   40 mg Oral BID   rosuvastatin   40 mg Oral Daily   sevelamer  carbonate  800 mg Oral TID WC   sodium bicarbonate   650 mg Oral BID    anticoagulant sodium citrate      dextrose  5% lactated ringers  50 mL/hr at 03/17/24 0431   insulin  Stopped (03/17/24 9362)   acetaminophen , albuterol , alteplase , alteplase , anticoagulant sodium citrate , dextrose , heparin , heparin , heparin , hydrALAZINE , HYDROcodone -acetaminophen , hydrOXYzine , lidocaine  (PF), lidocaine  (PF), lidocaine -prilocaine , lidocaine -prilocaine , pentafluoroprop-tetrafluoroeth, pentafluoroprop-tetrafluoroeth, prochlorperazine 

## 2024-03-17 NOTE — Progress Notes (Signed)
 Goal increased to 3500 per provider

## 2024-03-17 NOTE — Progress Notes (Signed)
  Received patient in bed to unit.   Informed consent signed and in chart.    TX duration:3.5     Transported by  Hand-off given to patient's nurse.    Access used: left AVF Access issues:none    Total UF removed: 3100 Medication(s) given: dilaudid  atarax  po  Post HD VS: 179/89      Hunter Hacking LPN Kidney Dialysis Unit

## 2024-03-17 NOTE — Progress Notes (Signed)
 Pt receives out-pt HD at [facility] ([address] Contacted clinic to inform of pt arrival, she is now on a TTS schedule, not MWF. Her new schedule at this clinic is TTS 1100 chair time. Will inform nephrology and continue to assist as needed.   Konnor Jorden Dialysis Navigator [phone]

## 2024-03-17 NOTE — Inpatient Diabetes Management (Signed)
 Inpatient Diabetes Program Recommendations  AACE/ADA: New Consensus Statement on Inpatient Glycemic Control   Target Ranges:  Prepandial:   less than 140 mg/dL      Peak postprandial:   less than 180 mg/dL (1-2 hours)      Critically ill patients:  140 - 180 mg/dL    Latest Reference Range & Units 03/17/24 00:10 03/17/24 00:46 03/17/24 01:26 03/17/24 01:59 03/17/24 02:36 03/17/24 03:29 03/17/24 04:26 03/17/24 05:35 03/17/24 06:35  Glucose-Capillary 70 - 99 mg/dL 437 (HH) 449 (HH) 570 (H) 428 (H) 383 (H) 316 (H) 234 (H) 155 (H) 113 (H)    Latest Reference Range & Units 03/16/24 16:08 03/16/24 16:22 03/16/24 22:30 03/17/24 01:30 03/17/24 10:31 03/17/24 12:40  CO2 22 - 32 mmol/L 16 (L)  15 (L) 14 (L) 20 (L) 23  Glucose 70 - 99 mg/dL 8,842 (HH) >299 (HH) 194 (HH) 479 (H) 111 (H) 72  Anion gap 5 - 15  21 (H)  19 (H) 23 (H) 20 (H) 15    Latest Reference Range & Units 03/16/24 16:08 03/17/24 10:31  Beta-Hydroxybutyric Acid 0.05 - 0.27 mmol/L 2.10 (H) 0.09   Review of Glycemic Control  Diabetes history: DM1 (does NOT make any insulin ; requires basal, carb coverage, and correction insulin ) Outpatient Diabetes medications: Lantus  10 units daily, Novolog  5 units TID with meals Current orders for Inpatient glycemic control: Semglee  10 units Q24H, Novolog  0-9 units TID with meals, Novolog  0-5 units at bedtime, Novolog  3 units TID with meals  Inpatient Diabetes Program Recommendations:    Insulin : Patient was ordered IV insulin  drip which was stopped around 6:38 am today and no basal insulin  was given prior to IV Insulin  drip being stopped. Patient had dialysis this morning.  CBG 72 mg/dl at 87:59 today and patient given Semglee  10 units at 14:19 pm today.  Please consider decreasing Novolog  correction to 0-6 units TID with meals.  If patient experiences hypoglycemia, please consider decreasing Semglee  to 5 units Q24H.  NOTE: Patient admitted with DKA with initial glucose of 1157 mg/dl on 88/82/74 and  was ordered IV Insulin . Per chart review, IV insulin  was stopped at 6:38 am today.  Patient has multiple ED visits and hospital admissions and well known to inpatient diabetes team.  Thanks, Ashley Gainer, RN, MSN, CDCES Diabetes Coordinator Inpatient Diabetes Program [phone] (Team Pager from 8am to 5pm)

## 2024-03-17 NOTE — Plan of Care (Signed)
  Problem: Education: Goal: Individualized Educational Video(s) Outcome: Progressing   Problem: Fluid Volume: Goal: Ability to maintain a balanced intake and output will improve Outcome: Progressing   Problem: Health Behavior/Discharge Planning: Goal: Ability to identify and utilize available resources and services will improve Outcome: Progressing   Problem: Metabolic: Goal: Ability to maintain appropriate glucose levels will improve Outcome: Progressing   Problem: Nutritional: Goal: Maintenance of adequate nutrition will improve Outcome: Progressing Goal: Progress toward achieving an optimal weight will improve Outcome: Progressing   Problem: Tissue Perfusion: Goal: Adequacy of tissue perfusion will improve Outcome: Progressing   Problem: Education: Goal: Ability to describe self-care measures that may prevent or decrease complications (Diabetes Survival Skills Education) will improve Outcome: Progressing Goal: Individualized Educational Video(s) Outcome: Progressing   Problem: Cardiac: Goal: Ability to maintain an adequate cardiac output will improve Outcome: Progressing   Problem: Health Behavior/Discharge Planning: Goal: Ability to identify and utilize available resources and services will improve Outcome: Progressing Goal: Ability to manage health-related needs will improve Outcome: Progressing   Problem: Fluid Volume: Goal: Ability to achieve a balanced intake and output will improve Outcome: Progressing   Problem: Metabolic: Goal: Ability to maintain appropriate glucose levels will improve Outcome: Progressing   Problem: Nutritional: Goal: Maintenance of adequate nutrition will improve Outcome: Progressing Goal: Maintenance of adequate weight for body size and type will improve Outcome: Progressing   Problem: Respiratory: Goal: Will regain and/or maintain adequate ventilation Outcome: Progressing   Problem: Urinary Elimination: Goal: Ability to  achieve and maintain adequate renal perfusion and functioning will improve Outcome: Progressing

## 2024-03-17 NOTE — ED Notes (Signed)
 Insulin  stopped at this time per ENDO tool instructions.

## 2024-03-17 NOTE — Procedures (Signed)
 S: pt seen in [facility]. No issues w/ BP, access working well.   Vitals:   03/17/24 1100 03/17/24 1124 03/17/24 1127 03/17/24 1130  BP: (!) 197/104 (!) 192/104 (!) 179/89 (!) 179/89  Pulse: 99 98 98 94  Resp: 17 10 10    Temp:  98.6 F (37 C)    TempSrc:  Oral    SpO2: 98% 95% 98% 98%    Recent Labs  Lab 03/16/24 1622 03/16/24 2230 03/17/24 0130 03/17/24 0746 03/17/24 1031  HGB 11.9*  12.2  --   --  9.6*  --   CALCIUM   --    < > 8.8*  --  8.7*  CREATININE 8.50*   < > 9.18*  --  4.55*  K 6.0*  6.0*   < > 4.7  --  3.6   < > = values in this interval not displayed.    Inpatient medications:  carvedilol   25 mg Oral BID WC   Chlorhexidine  Gluconate Cloth  6 each Topical Q0600   DULoxetine   20 mg Oral Daily   heparin   5,000 Units Subcutaneous Q8H   lamoTRIgine   200 mg Oral Daily   pantoprazole   40 mg Oral BID   rosuvastatin   40 mg Oral Daily   sevelamer  carbonate  800 mg Oral TID WC   sodium bicarbonate   650 mg Oral BID    dextrose  5% lactated ringers  50 mL/hr at 03/17/24 0431   insulin  Stopped (03/17/24 0637)   acetaminophen , albuterol , dextrose , hydrALAZINE , HYDROcodone -acetaminophen , hydrOXYzine , hydrOXYzine , prochlorperazine   I was present at the procedure, reviewed the HD regimen and made appropriate changes.   Myer Fret MD  [facility] 03/17/2024, 12:21 PM

## 2024-03-18 DIAGNOSIS — E1010 Type 1 diabetes mellitus with ketoacidosis without coma: Secondary | ICD-10-CM | POA: Diagnosis not present

## 2024-03-18 LAB — GLUCOSE, CAPILLARY
Glucose-Capillary: 117 mg/dL — ABNORMAL HIGH (ref 70–99)
Glucose-Capillary: 38 mg/dL — CL (ref 70–99)
Glucose-Capillary: 45 mg/dL — ABNORMAL LOW (ref 70–99)
Glucose-Capillary: 125 mg/dL — ABNORMAL HIGH (ref 70–99)
Glucose-Capillary: 137 mg/dL — ABNORMAL HIGH (ref 70–99)
Glucose-Capillary: 97 mg/dL (ref 70–99)

## 2024-03-18 LAB — BASIC METABOLIC PANEL WITH GFR
Anion gap: 17 — ABNORMAL HIGH (ref 5–15)
BUN: 28 mg/dL — ABNORMAL HIGH (ref 6–20)
CO2: 22 mmol/L (ref 22–32)
Calcium: 8.5 mg/dL — ABNORMAL LOW (ref 8.9–10.3)
Chloride: 97 mmol/L — ABNORMAL LOW (ref 98–111)
Creatinine, Ser: 6.34 mg/dL — ABNORMAL HIGH (ref 0.44–1.00)
GFR, Estimated: 8 mL/min — ABNORMAL LOW (ref >60)
Glucose, Bld: 127 mg/dL — ABNORMAL HIGH (ref 70–99)
Potassium: 4.4 mmol/L (ref 3.5–5.1)
Sodium: 136 mmol/L (ref 135–145)

## 2024-03-18 NOTE — Plan of Care (Signed)
  Problem: Education: Goal: Ability to describe self-care measures that may prevent or decrease complications (Diabetes Survival Skills Education) will improve Outcome: Progressing Goal: Individualized Educational Video(s) Outcome: Progressing   Problem: Coping: Goal: Ability to adjust to condition or change in health will improve Outcome: Progressing   Problem: Fluid Volume: Goal: Ability to maintain a balanced intake and output will improve Outcome: Progressing   Problem: Health Behavior/Discharge Planning: Goal: Ability to identify and utilize available resources and services will improve Outcome: Progressing Goal: Ability to manage health-related needs will improve Outcome: Progressing   Problem: Metabolic: Goal: Ability to maintain appropriate glucose levels will improve Outcome: Progressing   Problem: Nutritional: Goal: Maintenance of adequate nutrition will improve Outcome: Progressing Goal: Progress toward achieving an optimal weight will improve Outcome: Progressing   Problem: Skin Integrity: Goal: Risk for impaired skin integrity will decrease Outcome: Progressing   Problem: Tissue Perfusion: Goal: Adequacy of tissue perfusion will improve Outcome: Progressing   Problem: Education: Goal: Ability to describe self-care measures that may prevent or decrease complications (Diabetes Survival Skills Education) will improve Outcome: Progressing Goal: Individualized Educational Video(s) Outcome: Progressing   Problem: Cardiac: Goal: Ability to maintain an adequate cardiac output will improve Outcome: Progressing   Problem: Health Behavior/Discharge Planning: Goal: Ability to identify and utilize available resources and services will improve Outcome: Progressing Goal: Ability to manage health-related needs will improve Outcome: Progressing   Problem: Fluid Volume: Goal: Ability to achieve a balanced intake and output will improve Outcome: Progressing    Problem: Metabolic: Goal: Ability to maintain appropriate glucose levels will improve Outcome: Progressing   Problem: Nutritional: Goal: Maintenance of adequate nutrition will improve Outcome: Progressing Goal: Maintenance of adequate weight for body size and type will improve Outcome: Progressing   Problem: Respiratory: Goal: Will regain and/or maintain adequate ventilation Outcome: Progressing   Problem: Urinary Elimination: Goal: Ability to achieve and maintain adequate renal perfusion and functioning will improve Outcome: Progressing   Problem: Fluid Volume: Goal: Compliance with measures to maintain balanced fluid volume will improve Outcome: Progressing   Problem: Health Behavior/Discharge Planning: Goal: Ability to manage health-related needs will improve Outcome: Progressing   Problem: Clinical Measurements: Goal: Complications related to the disease process, condition or treatment will be avoided or minimized Outcome: Progressing

## 2024-03-18 NOTE — Progress Notes (Signed)
 POCT Glucose check was done at 0140 based on patient's request; result was 38. Recheck showed 117 after administering D50. Patient is quietly sleeping without discomfort; Provider notified.

## 2024-03-18 NOTE — Progress Notes (Signed)
  [facility] Progress Note   Subjective:  Seen in room. Completed dialysis yesterday - net UF 3L  On room air. Does endorse some orthopnea.  Asks if getting paracentesis today.   Objective Vitals:   03/18/24 0406 03/18/24 0655 03/18/24 0800 03/18/24 1155  BP: (!) 143/91 (!) 148/63 (!) 145/51 (!) 145/79  Pulse: 72 73 76 85  Resp: 14 15 16 16   Temp: 97.6 F (36.4 C) 97.8 F (36.6 C) 97.7 F (36.5 C) 98 F (36.7 C)  TempSrc: Oral Oral Oral Oral  SpO2: 100%  91% 97%  Weight:  68.6 kg    Height:  5' 3 (1.6 m)      Additional Objective Labs: Basic Metabolic Panel: Recent Labs  Lab 03/17/24 1031 03/17/24 1240 03/18/24 0706  NA 134* 136 136  K 3.6 4.2 4.4  CL 94* 98 97*  CO2 20* 23 22  GLUCOSE 111* 72 127*  BUN 26* 26* 28*  CREATININE 4.55* 5.13* 6.34*  CALCIUM  8.7* 8.5* 8.5*   CBC: Recent Labs  Lab 03/16/24 1608 03/16/24 1622 03/17/24 0746  WBC 8.1  --  16.1*  NEUTROABS 4.2  --   --   HGB 10.0* 11.9*  12.2 9.6*  HCT 34.3* 35.0*  36.0 29.8*  MCV 94.5  --  85.6  PLT 265  --  291   Blood Culture    Component Value Date/Time   SDES BLOOD LEFT HAND 03/08/2024 0728   SPECREQUEST  03/08/2024 0728    BOTTLES DRAWN AEROBIC ONLY Blood Culture results may not be optimal due to an inadequate volume of blood received in culture bottles   CULT  03/08/2024 0728    NO GROWTH 5 DAYS Performed at [facility], [address] [place], [place] [address]    REPTSTATUS 03/13/2024 FINAL 03/08/2024 9271    Physical Exam General: Alert, lying in bed, nad  Heart: RRR Lungs: Clear, normal wob Abdomen: soft +distended  Extremities: no LE edema  Dialysis Access: RUE AVF +bruite   Medications:   bumetanide   10 mg Oral Daily   carvedilol   25 mg Oral BID WC   Chlorhexidine  Gluconate Cloth  6 each Topical Q0600   DULoxetine   20 mg Oral Daily   heparin   5,000 Units Subcutaneous Q8H   insulin  aspart  0-9 Units Subcutaneous TID WC   insulin   glargine-yfgn  5 Units Subcutaneous Q24H   lamoTRIgine   200 mg Oral Daily   pantoprazole   40 mg Oral BID   rosuvastatin   40 mg Oral Daily   sevelamer  carbonate  800 mg Oral TID WC   sodium bicarbonate   650 mg Oral BID    Dialysis Orders:  [facility] [place] TTS   Assessment/Plan: DKA/Uncontrolled T1DM. Management per primary  Hyperkalemia. Resolved with HD.  ESRD. HD TTS schedule. . Continue per schedule. No urgent indication today.  HTN. BP Elevated. Continue home meds. UF with HD as able.  HFrEF.  Volume overload on admit. UF as able.  Anemia. Hgb 9.6. Resume ESA as outpatient   Maisie Ronnald Acosta PA-C [facility] 03/18/2024,12:30 PM

## 2024-03-18 NOTE — Progress Notes (Signed)
 Order to discharge patient home. Discharge instructions/AVS given to and reviewed with patient. Education provided as needed . Patient verbalized understanding. 1 PIV removed by the RN. Personal belongings sent home with the patient. Home via private vehicle.

## 2024-03-19 ENCOUNTER — Other Ambulatory Visit (HOSPITAL_COMMUNITY): Payer: Self-pay | Admitting: Nephrology

## 2024-03-19 DIAGNOSIS — R188 Other ascites: Secondary | ICD-10-CM

## 2024-03-19 NOTE — Progress Notes (Addendum)
 Late note entry 11/20 0851 D/c note. Contacted out-pt HD clinic, [facility], to inform of pt d/c and anticipated arrival back today. Last nephrology note faxed at this time. No d/c summary, no further support needed.   Lavanda Kaleel Schmieder Dialysis Navigator [phone]

## 2024-03-19 NOTE — Discharge Summary (Signed)
 Physician Discharge Summary   Patient: Ashley Freeman MRN: [id number] DOB: 01-01-1991  Admit date:     03/16/2024  Discharge date: 03/18/2024  Discharge Physician: Yetta Blanch  PCP: Keven Crumbly Pap, MD  Recommendations at discharge: Follow-up with PCP.  Continue hemodialysis as scheduled.   Follow-up Information     [name], [name], MD. Schedule an appointment as soon as possible for a visit in 1 week(s).   Specialty: Family Medicine Why: with BMP lab to look at kidney/electrolyte numbers Contact information: [address] [place] [address] [phone]                Hospital Course: Ashley Freeman is a 33 y.o. female with medical history significant for ESRD on TTS HD, chronic HFrEF (EF 30-35%), T1DM, HTN, anemia of chronic disease, HLD, BPD, right neck abscess s/p I&D 02/23/2024 who is admitted with DKA. Assessment and Plan: Diabetic ketoacidosis in uncontrolled type 1 diabetes: Continue IV insulin  infusion per protocol Anion gap closed. Was able to tolerate oral diet.  That She Has Access to Insulin  and Has Not Ran Out Of Them. Etiology of the DKA is not clear.  Potential noncompliance. Continue home regimen.   ESRD on TTS HD: Reported last HD was on 11/13 after missing dialysis on Saturday.  Potassium elevated but anticipate improvement with IV insulin . Underwent HD on 11/18. 3.5 L removed.  Significant improvement in volume overload seen after HD. Has history of chronic noncompliance. Recommend to continue HD as scheduled.   Hyperkalemia: Given Lokelma  in the ED and on IV insulin  infusion.  Resolved with HD.   Chronic HFrEF: TTE 08/29/2023 showed EF 30-35%, LV global hypokinesis, G2DD.  At the moment does not appear grossly volume overloaded. Continue Coreg  25 mg twice daily Resuming home regimen.   Hypertension: Blood pressure stable. Continue home regimen.   Anemia of chronic disease: Hemoglobin stable.  Continue to  monitor.   Hyperlipidemia: Continue rosuvastatin .   Bipolar disorder: Continue Lamictal , Cymbalta , Atarax  as needed.  Consultants:  Nephrology  Procedures performed:  HD  DISCHARGE MEDICATION: Allergies as of 03/18/2024       Reactions   Keflex  [cephalexin ] Anaphylaxis   Ceftriaxone  in the past with no reaction   Penicillins Anaphylaxis, Hives, Rash   Vibramycin  [doxycycline ] Anaphylaxis   Benadryl  [diphenhydramine ] Itching   Dilaudid  [hydromorphone ] Itching   Methotrexate And Trimetrexate Rash   Roxicodone  [oxycodone ] Itching   Takes Percocet without issue        Medication List     TAKE these medications    albuterol  108 (90 Base) MCG/ACT inhaler Commonly known as: VENTOLIN  HFA Inhale 2 puffs into the lungs every 4 (four) hours as needed for wheezing or shortness of breath.   bumetanide  2 MG tablet Commonly known as: BUMEX  Take 10 mg by mouth daily.   carvedilol  25 MG tablet Commonly known as: COREG  Take 25 mg by mouth 2 (two) times daily with a meal.   DULoxetine  20 MG capsule Commonly known as: CYMBALTA  Take 20 mg by mouth daily.   Entresto 24-26 MG Generic drug: sacubitril-valsartan Take 1 tablet by mouth 2 (two) times daily.   fluticasone  50 MCG/ACT nasal spray Commonly known as: FLONASE  Place 2 sprays into both nostrils daily.   HYDROcodone -acetaminophen  5-325 MG tablet Commonly known as: NORCO/VICODIN Take 1-2 tablets by mouth every 6 (six) hours as needed for moderate pain (pain score 4-6) or severe pain (pain score 7-10).   hydrOXYzine  25 MG tablet Commonly known as: ATARAX   Take 25 mg by mouth 3 (three) times daily as needed for anxiety, itching, nausea or vomiting.   ketoconazole  2 % cream Commonly known as: NIZORAL  Apply 1 Application topically 2 (two) times daily. Apply 1gm twice daily to affected skin on feet   lamoTRIgine  200 MG tablet Commonly known as: LAMICTAL  Take 1 tablet (200 mg total) by mouth daily.   Lantus  SoloStar  100 UNIT/ML Solostar Pen Generic drug: insulin  glargine Inject 10 Units into the skin daily.   metoCLOPramide  10 MG tablet Commonly known as: REGLAN  Take 0.5 tablets (5 mg total) by mouth every 8 (eight) hours as needed for nausea or refractory nausea / vomiting.   NovoLOG  FlexPen 100 UNIT/ML FlexPen Generic drug: insulin  aspart INJECT 5 UNITS UNDER THE SKIN 3 TIMES A DAY BEFORE MEALS. IF YOU DO NOT EAT A MEAL, PLEASE CHEECK YOUR BLOOD SUGAR AND GIVE YOURSELF 1 UNIT OF INSULIN  FOR EVERY 50MG /DL GREATER THAN 869. MAX 30 UNITS PER DAY.   OLANZapine  zydis 5 MG disintegrating tablet Commonly known as: ZYPREXA  Take 5 mg by mouth at bedtime.   pantoprazole  40 MG tablet Commonly known as: PROTONIX  Take 1 tablet (40 mg total) by mouth 2 (two) times daily.   prochlorperazine  5 MG tablet Commonly known as: COMPAZINE  Take 5 mg by mouth every 6 (six) hours as needed for nausea or vomiting.   rosuvastatin  40 MG tablet Commonly known as: CRESTOR  Take 40 mg by mouth daily.   sevelamer  carbonate 800 MG tablet Commonly known as: RENVELA  Take 1 tablet (800 mg total) by mouth 3 (three) times daily with meals.   sodium bicarbonate  650 MG tablet Take 650 mg by mouth 2 (two) times daily.   sucralfate  1 GM/10ML suspension Commonly known as: CARAFATE  Take 10 mLs (1 g total) by mouth 2 (two) times daily for 14 days.   SUMAtriptan  50 MG tablet Commonly known as: IMITREX  Take 50 mg by mouth every 2 (two) hours as needed for migraine or headache.       Disposition: Home Diet recommendation: Carb modified diet  Discharge Exam: Vitals:   03/18/24 0406 03/18/24 0655 03/18/24 0800 03/18/24 1155  BP: (!) 143/91 (!) 148/63 (!) 145/51 (!) 145/79  Pulse: 72 73 76 85  Resp: 14 15 16 16   Temp: 97.6 F (36.4 C) 97.8 F (36.6 C) 97.7 F (36.5 C) 98 F (36.7 C)  TempSrc: Oral Oral Oral Oral  SpO2: 100%  91% 97%  Weight:  68.6 kg    Height:  5' 3 (1.6 m)     Clear to auscultation. S1-S2  present Bowel sounds No edema. Resolution of peripheral edema. Filed Weights   03/18/24 0100 03/18/24 0655  Weight: 70.7 kg 68.6 kg   Condition at discharge: stable  The results of significant diagnostics from this hospitalization (including imaging, microbiology, ancillary and laboratory) are listed below for reference.   Imaging Studies: DG Chest Portable 1 View Result Date: 03/16/2024 CLINICAL DATA:  Chest pain, hyperglycemia. EXAM: PORTABLE CHEST 1 VIEW COMPARISON:  03/08/2024 and CT chest 07/15/2023. FINDINGS: Trachea is midline. Heart is enlarged. Lungs are low in volume with mild interstitial prominence and indistinctness. No pleural fluid. A vascular stent is partially imaged in the proximal right upper arm. IMPRESSION: Mild pulmonary edema. Electronically Signed   By: Newell Eke M.D.   On: 03/16/2024 17:44   CT ABDOMEN PELVIS WO CONTRAST Result Date: 03/08/2024 EXAM: CT ABDOMEN AND PELVIS WITHOUT CONTRAST 03/08/2024 08:45:12 AM TECHNIQUE: CT of the abdomen and pelvis was  performed without the administration of intravenous contrast. Multiplanar reformatted images are provided for review. Automated exposure control, iterative reconstruction, and/or weight-based adjustment of the mA/kV was utilized to reduce the radiation dose to as low as reasonably achievable. COMPARISON: Similar to the previous exam. CLINICAL HISTORY: Abdominal pain, acute, nonlocalized. FINDINGS: LOWER CHEST: Heterogeneous ground glass attenuation is identified within both lung bases along with mild interstitial thickening suggesting pulmonary edema. Small right pleural effusion. LIVER: The liver is unremarkable. GALLBLADDER AND BILE DUCTS: Status post cholecystectomy. No biliary ductal dilatation. SPLEEN: The spleen is within normal limits in size and appearance. PANCREAS: The pancreas is normal in size and contour without focal lesion or ductal dilatation. ADRENAL GLANDS: Normal size and morphology bilaterally. No  nodule, thickening, or hemorrhage. No periadrenal stranding. KIDNEYS, URETERS AND BLADDER: No stones in the kidneys or ureters. No hydronephrosis. No perinephric or periureteral stranding. The urinary bladder is decompressed. GI AND BOWEL: Stomach demonstrates no acute abnormality. The appendix is visualized and normal in caliber, without wall thickening, periappendiceal inflammation, or fluid. There is no bowel obstruction. PERITONEUM AND RETROPERITONEUM: Moderate volume of ascites noted within the abdomen and pelvis, similar to the previous exam. No free air. VASCULATURE: Aorta is normal in caliber. Extensive vascular calcifications are again noted. LYMPH NODES: No lymphadenopathy. REPRODUCTIVE ORGANS: Uterus and adnexal structures are unremarkable. BONES AND SOFT TISSUES: No acute osseous abnormality. Diffuse body wall edema compatible with anasarca. IMPRESSION: 1. Findings of pulmonary edema with small right pleural effusion. 2. Moderate ascites, similar to prior. 3. Diffuse body wall edema compatible with anasarca. Electronically signed by: Waddell Calk MD 03/08/2024 08:52 AM EST RP Workstation: [id number]   DG Chest Port 1 View Result Date: 03/08/2024 CLINICAL DATA:  Hypoglycemia, unresponsive, concern for sepsis EXAM: PORTABLE CHEST 1 VIEW COMPARISON:  02/22/2024 FINDINGS: Similar mild cardiomegaly without acute airspace process, pneumonia, collapse, consolidation, edema pattern, or CHF. No effusion or pneumothorax. Trachea midline. Monitor leads overlie the chest. Right upper arm axillary stent noted. IMPRESSION: Cardiomegaly without acute process. Electronically Signed   By: CHRISTELLA.  Shick M.D.   On: 03/08/2024 08:06   CT SOFT TISSUE NECK W CONTRAST Result Date: 02/23/2024 EXAM: CT NECK WITH CONTRAST 02/22/2024 04:08:12 PM TECHNIQUE: CT of the neck was performed with the administration of 75 mL of iohexol  (OMNIPAQUE ) 350 MG/ML injection. Multiplanar reformatted images are provided for review. Automated  exposure control, iterative reconstruction, and/or weight based adjustment of the mA/kV was utilized to reduce the radiation dose to as low as reasonably achievable. COMPARISON: Neck CT without contrast earlier the same day. CLINICAL HISTORY: 33 year old female. Soft tissue infection suspected, neck, xray done. FINDINGS: AERODIGESTIVE TRACT: Small retropharyngeal space effusion is redemonstrated on series 3 image 62. No retropharyngeal space enhancement or organized fluid. Larynx and pharynx are stable and negative compared to the earlier non-contrast exam. Parapharyngeal spaces and sublingual space are stable and negative compared to the earlier non-contrast exam. SALIVARY GLANDS: Submandibular glands and parotid spaces are stable and negative compared to the earlier non-contrast exam. THYROID : Thyroid  is stable and negative compared to the earlier non-contrast exam. LYMPH NODES: Postcontrast CT confirms a rim enhancement lesion in the right posterior neck, right level 5 lymph node station area corresponding to dominant abnormality on the non-contrast exam. This encompasses 20 x 18 x 29 mm (AP x transverse x CC), see coronal image 78. It is surrounded by hyperenhancing, rounded and enlarged lymph nodes individually measuring up to 11 mm. The lesion has internal hypodensity which appears to be complex  fluid. The fluid component is roughly 10 x 11 x 19 mm. Estimated volume of the fluid component is 1 mL. Asymmetric reactive lymph nodes in that region extend up toward the right level 2 nodal station and down toward the right level 4 station. Confirmed lymphadenopathy fluid collection right posterior neck, level 5 / muscle layer. Deedra this is a suppurative lymph node lymph node abscess. SOFT TISSUES: There is regional soft tissue swelling. No soft tissue gas. BRAIN, ORBITS, SINUSES AND MASTOIDS: Chronic postoperative changes to both globes. Stable mild to moderate scattered paranasal sinus mucosal thickening. Mild  right mastoid effusion. Tympanic cavities are aerated. No acute dental finding. No acute osseous abnormality. Major vascular structures in the bilateral neck and at the skull base are enhancing and patent including the right IJ. There is age advanced atherosclerosis at both carotid bifurcations and both ICA siphons. LUNGS AND MEDIASTINUM: Mild respiratory motion in the upper chest. Layering right pleural effusion is small to moderate. Patchy bilateral upper lung ground glass opacities. No apical lung consolidation. No superior mediastinal lymphadenopathy. BONES: No focal bone abnormality. No acute traumatic injury identified in the neck. IMPRESSION: 1. Positive soft tissue infection: confirmed lymphadenopathy and rim enhancing fluid collection in the right posterior neck, level 5/muscle layer. Favor a suppurative lymph node / nodal abscess (2 cm, vol 1 mL). 2. Small retropharyngeal space effusion is stable and likely reactive. Stable paranasal sinus inflammation. 3. Right lung layering effusion visible, small to moderate. 4. Age-advanced atherosclerosis at the carotid bifurcations and ICA siphons. Electronically signed by: Helayne Hurst MD 02/23/2024 04:04 AM EDT RP Workstation: [id number]   CT SOFT TISSUE NECK WO CONTRAST Result Date: 02/22/2024 EXAM: CT NECK WITHOUT CONTRAST 02/22/2024 08:31:39 AM TECHNIQUE: CT of the neck was performed without the administration of intravenous contrast. Multiplanar reformatted images are provided for review. Automated exposure control, iterative reconstruction, and/or weight based adjustment of the mA/kV was utilized to reduce the radiation dose to as low as reasonably achievable. COMPARISON: Chest radiograph 02/22/2024. CLINICAL HISTORY: 33 year old female. Soft tissue infection suspected, neck, xray done. End stage renal disease. FINDINGS: AERODIGESTIVE TRACT: Retropharyngeal or prevertebral space edema is mild (series 3, image 62). Laryngeal and pharyngeal soft tissue  contours are within normal limits. No discrete mass. SALIVARY GLANDS: The parotid and submandibular glands are unremarkable. Negative noncontrast sublingual space. THYROID : Unremarkable. LYMPH NODES: Surrounding the phlegmon-like soft tissue mass in the right posterior neck musculature, asymmetric level 5, level 2b, and level 3b lymph nodes measure up to 12 mm short axis individually (series 3, images 48 through 56). Enlarged and reactive appearing right level 4 and supraclavicular lymph nodes also measure up to 14 mm short axis (series 3, image 85). SOFT TISSUES: Abnormal indistinct phlegmon-like soft tissue mass and spiculation right posterior neck musculature, trapezius, best seen on sagittal image 45. This is in an area of about 2.3 cm. No soft tissue gas. BRAIN, ORBITS, SINUSES AND MASTOIDS: Age advanced calcified atherosclerosis at the bilateral skull base. Mild to moderate bilateral paranasal sinus mucosal thickening. Trace right mastoid effusion. Tympanic cavities well aerated. No acute dental finding. LUNGS AND MEDIASTINUM: Layering right pleural effusion in the lung apex. Mild respiratory motion and atelectatic changes in the visible upper lungs and airways. Tortuous proximal great vessels. BONES: No osseous abnormality identified. VASCULATURE: Vascular patency not evaluated in the absence of IV contrast. Age advanced bilateral carotid and calcified atherosclerosis in the neck. Age advanced calcified atherosclerosis at the bilateral skull base. IMPRESSION: 1. non-contrast CT appearance strongly suggestive  of Phlegmonous soft tissue infection in the right posterior neck musculature (trapezius) measuring approximately 2.3 cm. No organized or drainable fluid collection is evident. Correlation with focused Ultrasound there may be valuable. 2. Mild retropharyngeal or prevertebral space edema, and regional lymphadenopathy which tracks inferiorly to the right supraclavicular fossa. 3. Mild to moderate bilateral  paranasal sinus inflammation. Mild right mastoid effusion. 4. Layering right pleural effusion in the apex. Electronically signed by: Helayne Hurst MD 02/22/2024 08:49 AM EDT RP Workstation: [id number]   DG Shoulder 1V Right Result Date: 02/22/2024 EXAM: 1 VIEW XRAY OF THE RIGHT SHOULDER 02/22/2024 06:20:00 AM COMPARISON: Chest radiograph today. CLINICAL HISTORY: FINDINGS: BONES AND JOINTS: Glenohumeral joint is normally aligned. No acute fracture or dislocation. The [facility] joint is unremarkable in appearance. SOFT TISSUES: Vascular stent in right upper extremity. No abnormal calcifications. Visualized lung is unremarkable. IMPRESSION: 1. No osseous abnormality of the right shoulder. 2. Right axillary vascular stent in place. Electronically signed by: Helayne Hurst MD 02/22/2024 06:41 AM EDT RP Workstation: [id number]   DG Chest Port 1 View Result Date: 02/22/2024 EXAM: 1 VIEW(S) XRAY OF THE CHEST 02/22/2024 06:20:00 AM COMPARISON: 02/20/2024 CLINICAL HISTORY: SOB (shortness of breath) [id number]. Reason for exam: shortness of breath; CHIEF COMPLAINTS: Hyperglycemia ; Principal Problem: ; DKA (diabetic ketoacidosis) (HCC) FINDINGS: LINES, TUBES AND DEVICES: Multiple overlying monitor wires noted. LUNGS AND PLEURA: Hypoinflated lungs. Bilateral interstitial prominence. Veiling right pleural effusion less apparent. No focal pulmonary opacity. No overt pulmonary edema. No pneumothorax. HEART AND MEDIASTINUM: Mild cardiomegaly with central vascular prominence. BONES AND SOFT TISSUES: No acute osseous abnormality. IMPRESSION: 1. Hypoinflated lungs with interstitial prominence. Right pleural effusion less apparent. 2. No new cardiopulmonary abnormality. Electronically signed by: Helayne Hurst MD 02/22/2024 06:40 AM EDT RP Workstation: [id number]   DG Chest Portable 1 View Result Date: 02/20/2024 EXAM: 1 VIEW XRAY OF THE CHEST 02/20/2024 08:53:00 AM COMPARISON: Portable chest 02/18/2024 and earlier. CLINICAL HISTORY:  33 year old female. DKA, volume overload. History of HTN and diabetes. Not taking insulin  for 2 days, vomiting. FINDINGS: LUNGS AND PLEURA: Mildly rotated to the left. Mild new veiling right lung base opacity. Small right pleural effusion suspected. Mild linear opacity in the periphery of the left lung appears to be atelectasis. No pulmonary edema. No pneumothorax. HEART AND MEDIASTINUM: Stable cardiomegaly. BONES AND SOFT TISSUES: No acute osseous abnormality. IMPRESSION: 1. Small veiling right pleural effusion now suspected. 2. Stable cardiomegaly. Electronically signed by: Helayne Hurst MD 02/20/2024 08:58 AM EDT RP Workstation: [id number]    Microbiology: Results for orders placed or performed during the hospital encounter of 03/08/24  Blood Culture (routine x 2)     Status: None   Collection Time: 03/08/24  7:23 AM   Specimen: BLOOD  Result Value Ref Range Status   Specimen Description BLOOD LEFT ANTECUBITAL  Final   Special Requests   Final    BOTTLES DRAWN AEROBIC AND ANAEROBIC Blood Culture results may not be optimal due to an inadequate volume of blood received in culture bottles   Culture   Final    NO GROWTH 5 DAYS Performed at [facility], [address], [place], [place] [address]    Report Status 03/13/2024 FINAL  Final  Blood Culture (routine x 2)     Status: None   Collection Time: 03/08/24  7:28 AM   Specimen: BLOOD LEFT HAND  Result Value Ref Range Status   Specimen Description BLOOD LEFT HAND  Final   Special Requests   Final  BOTTLES DRAWN AEROBIC ONLY Blood Culture results may not be optimal due to an inadequate volume of blood received in culture bottles   Culture   Final    NO GROWTH 5 DAYS Performed at [facility], [address] [place], [place] [address]    Report Status 03/13/2024 FINAL  Final   *Note: Due to a large number of results and/or encounters for the requested time period, some results have not been displayed. A complete set of results  can be found in Results Review.   Labs: CBC: Recent Labs  Lab 03/16/24 1608 03/16/24 1622 03/17/24 0746  WBC 8.1  --  16.1*  NEUTROABS 4.2  --   --   HGB 10.0* 11.9*  12.2 9.6*  HCT 34.3* 35.0*  36.0 29.8*  MCV 94.5  --  85.6  PLT 265  --  291   Basic Metabolic Panel: Recent Labs  Lab 03/16/24 2230 03/17/24 0130 03/17/24 1031 03/17/24 1240 03/18/24 0706  NA 128* 132* 134* 136 136  K 4.9 4.7 3.6 4.2 4.4  CL 94* 95* 94* 98 97*  CO2 15* 14* 20* 23 22  GLUCOSE 805* 479* 111* 72 127*  BUN 58* 57* 26* 26* 28*  CREATININE 8.85* 9.18* 4.55* 5.13* 6.34*  CALCIUM  8.1* 8.8* 8.7* 8.5* 8.5*   Liver Function Tests: No results for input(s): AST, ALT, ALKPHOS, BILITOT, PROT, ALBUMIN  in the last 168 hours. CBG: Recent Labs  Lab 03/18/24 0143 03/18/24 0157 03/18/24 0646 03/18/24 0813 03/18/24 1152  GLUCAP 45* 117* 125* 137* 97    Discharge time spent: greater than 30 minutes.  Author: Yetta Blanch, MD  [facility] 03/18/2024

## 2024-03-20 NOTE — Care Plan (Signed)
 Transition of Care Encounter Data   Call attempt: 2 Admission date: 03/16/24 Discharge date: 03/18/24 Discharge diagnosis: Diabetic ketoacidosis without coma associated with type 1 diabetes mellitus Patient post discharge: Medications:      SABRA   [facility]: [phone]:  .  [facility]: [phone]:  .  Other: Contact PCP:                                                  Transitions Case Management MyChart Follow-Up  Case Manager sent MyChart Followup message to patient's MyChart.     LOLITA MOLT, RN

## 2024-03-21 ENCOUNTER — Inpatient Hospital Stay (HOSPITAL_COMMUNITY)
Admission: EM | Admit: 2024-03-21 | Discharge: 2024-03-24 | DRG: 291 | Disposition: A | Discharge status: Home / Self Care | Attending: Internal Medicine | Admitting: Internal Medicine

## 2024-03-21 ENCOUNTER — Emergency Department (HOSPITAL_COMMUNITY)

## 2024-03-21 ENCOUNTER — Other Ambulatory Visit: Payer: Self-pay

## 2024-03-21 DIAGNOSIS — Z91199 Patient's noncompliance with other medical treatment and regimen due to unspecified reason: Secondary | ICD-10-CM

## 2024-03-21 DIAGNOSIS — R9431 Abnormal electrocardiogram [ECG] [EKG]: Secondary | ICD-10-CM | POA: Diagnosis present

## 2024-03-21 DIAGNOSIS — N186 End stage renal disease: Principal | ICD-10-CM

## 2024-03-21 DIAGNOSIS — E109 Type 1 diabetes mellitus without complications: Secondary | ICD-10-CM | POA: Diagnosis present

## 2024-03-21 DIAGNOSIS — E1065 Type 1 diabetes mellitus with hyperglycemia: Secondary | ICD-10-CM | POA: Diagnosis present

## 2024-03-21 DIAGNOSIS — R739 Hyperglycemia, unspecified: Secondary | ICD-10-CM

## 2024-03-21 DIAGNOSIS — R601 Generalized edema: Secondary | ICD-10-CM

## 2024-03-21 DIAGNOSIS — J189 Pneumonia, unspecified organism: Secondary | ICD-10-CM | POA: Diagnosis present

## 2024-03-21 DIAGNOSIS — E1022 Type 1 diabetes mellitus with diabetic chronic kidney disease: Secondary | ICD-10-CM

## 2024-03-21 DIAGNOSIS — F319 Bipolar disorder, unspecified: Secondary | ICD-10-CM | POA: Diagnosis present

## 2024-03-21 DIAGNOSIS — I5033 Acute on chronic diastolic (congestive) heart failure: Secondary | ICD-10-CM | POA: Diagnosis present

## 2024-03-21 DIAGNOSIS — I5020 Unspecified systolic (congestive) heart failure: Secondary | ICD-10-CM | POA: Diagnosis present

## 2024-03-21 LAB — COMPREHENSIVE METABOLIC PANEL WITH GFR
ALT: 31 U/L (ref 0–44)
AST: 93 U/L — ABNORMAL HIGH (ref 15–41)
Albumin: 3.1 g/dL — ABNORMAL LOW (ref 3.5–5.0)
Alkaline Phosphatase: 745 U/L — ABNORMAL HIGH (ref 38–126)
Anion gap: 19 — ABNORMAL HIGH (ref 5–15)
BUN: 42 mg/dL — ABNORMAL HIGH (ref 6–20)
CO2: 21 mmol/L — ABNORMAL LOW (ref 22–32)
Calcium: 8.6 mg/dL — ABNORMAL LOW (ref 8.9–10.3)
Chloride: 94 mmol/L — ABNORMAL LOW (ref 98–111)
Creatinine, Ser: 7.74 mg/dL — ABNORMAL HIGH (ref 0.44–1.00)
GFR, Estimated: 7 mL/min — ABNORMAL LOW (ref >60)
Glucose, Bld: 461 mg/dL — ABNORMAL HIGH (ref 70–99)
Potassium: 5.3 mmol/L — ABNORMAL HIGH (ref 3.5–5.1)
Sodium: 134 mmol/L — ABNORMAL LOW (ref 135–145)
Total Bilirubin: 1.2 mg/dL (ref 0.0–1.2)
Total Protein: 5.9 g/dL — ABNORMAL LOW (ref 6.5–8.1)

## 2024-03-21 LAB — CBC
HCT: 37.3 % (ref 36.0–46.0)
Hemoglobin: 11.1 g/dL — ABNORMAL LOW (ref 12.0–15.0)
MCH: 26.7 pg (ref 26.0–34.0)
MCHC: 29.8 g/dL — ABNORMAL LOW (ref 30.0–36.0)
MCV: 89.9 fL (ref 80.0–100.0)
Platelets: 272 K/uL (ref 150–400)
RBC: 4.15 MIL/uL (ref 3.87–5.11)
RDW: 15.9 % — ABNORMAL HIGH (ref 11.5–15.5)
WBC: 13.4 K/uL — ABNORMAL HIGH (ref 4.0–10.5)
nRBC: 0.4 % — ABNORMAL HIGH (ref 0.0–0.2)

## 2024-03-21 LAB — I-STAT VENOUS BLOOD GAS, ED
Acid-base deficit: 2 mmol/L (ref 0.0–2.0)
Bicarbonate: 24.7 mmol/L (ref 20.0–28.0)
Calcium, Ion: 1.04 mmol/L — ABNORMAL LOW (ref 1.15–1.40)
HCT: 38 % (ref 36.0–46.0)
Hemoglobin: 12.9 g/dL (ref 12.0–15.0)
O2 Saturation: 72 %
Potassium: 5.3 mmol/L — ABNORMAL HIGH (ref 3.5–5.1)
Sodium: 133 mmol/L — ABNORMAL LOW (ref 135–145)
TCO2: 26 mmol/L (ref 22–32)
pCO2, Ven: 48.5 mmHg (ref 44–60)
pH, Ven: 7.314 (ref 7.25–7.43)
pO2, Ven: 41 mmHg (ref 32–45)

## 2024-03-21 LAB — I-STAT CHEM 8, ED
BUN: 42 mg/dL — ABNORMAL HIGH (ref 6–20)
Calcium, Ion: 1.03 mmol/L — ABNORMAL LOW (ref 1.15–1.40)
Chloride: 101 mmol/L (ref 98–111)
Creatinine, Ser: 7.5 mg/dL — ABNORMAL HIGH (ref 0.44–1.00)
Glucose, Bld: 438 mg/dL — ABNORMAL HIGH (ref 70–99)
HCT: 39 % (ref 36.0–46.0)
Hemoglobin: 13.3 g/dL (ref 12.0–15.0)
Potassium: 5.4 mmol/L — ABNORMAL HIGH (ref 3.5–5.1)
Sodium: 134 mmol/L — ABNORMAL LOW (ref 135–145)
TCO2: 24 mmol/L (ref 22–32)

## 2024-03-21 LAB — CBG MONITORING, ED: Glucose-Capillary: 413 mg/dL — ABNORMAL HIGH (ref 70–99)

## 2024-03-21 LAB — BETA-HYDROXYBUTYRIC ACID: Beta-Hydroxybutyric Acid: 0.69 mmol/L — ABNORMAL HIGH (ref 0.05–0.27)

## 2024-03-21 LAB — BRAIN NATRIURETIC PEPTIDE: B Natriuretic Peptide: >4500 pg/mL — ABNORMAL HIGH (ref 0.0–100.0)

## 2024-03-21 LAB — HCG, SERUM, QUALITATIVE: Preg, Serum: NEGATIVE

## 2024-03-21 MED ORDER — SODIUM CHLORIDE 0.9 % IV SOLN
500.0000 mg | Freq: Once | INTRAVENOUS | Status: AC
  Administered 2024-03-22: 500 mg via INTRAVENOUS
  Filled 2024-03-21: qty 5

## 2024-03-21 MED ORDER — SODIUM CHLORIDE 0.9 % IV SOLN
1.0000 g | Freq: Once | INTRAVENOUS | Status: AC
  Administered 2024-03-21: 1 g via INTRAVENOUS
  Filled 2024-03-21: qty 10

## 2024-03-21 MED ORDER — HYDROCODONE-ACETAMINOPHEN 5-325 MG PO TABS
1.0000 | ORAL_TABLET | Freq: Once | ORAL | Status: AC
  Administered 2024-03-21: 1 via ORAL
  Filled 2024-03-21 (×2): qty 1

## 2024-03-21 NOTE — ED Notes (Signed)
 This phlebotomy stuck and couldn't get blood back

## 2024-03-21 NOTE — ED Triage Notes (Signed)
 Pt brought in by [facility] for one missed dialysis (T TH SAT)appointment. Last dialysis was Thursday. Pt reports she did not have a ride so she did not go. Pt complaining of missed dialysis feeling. EMS reports CBG of 500. Pt denies any missed medication dosages. Pt refused IV access with EMS.   BP 150/90 HR 80  SP02 99 RA R 14 CBG 500

## 2024-03-21 NOTE — ED Provider Notes (Signed)
 [facility] Provider Note   CSN: [id number] Arrival date & time: 03/21/24  1742     Patient presents with: Missed Dialysis , Abdominal Pain, and Back Pain   Ashley Freeman is a 32 y.o. female.  {Add pertinent medical, surgical, social history, OB history to YEP:67052} The history is provided by the patient and medical records.  Abdominal Pain Associated symptoms: cough   Back Pain Associated symptoms: abdominal pain    33 y.o. F with hx of ESRD on HD, CHF, anemia, diabetes, neuropathy, chronic pain, seizure disorder, presenting to the ED with multiple complaints.  Patient reports she generally feels unwell, has actually been feeling this way for a few days.  She missed her dialysis session earlier today as she did not have a ride.  States she was feeling very poorly but initially thought she would be okay until her next session.  She does report ongoing cough but denies any fever or chills.  No sick contacts reported-- has only been to school and HD this week. States she feels fluid build up in her abdomen.  No nausea/vomiting.  Still able to eat/drink today.    Prior to Admission medications   Medication Sig Start Date End Date Taking? Authorizing Provider  albuterol  (VENTOLIN  HFA) 108 (90 Base) MCG/ACT inhaler Inhale 2 puffs into the lungs every 4 (four) hours as needed for wheezing or shortness of breath. 11/24/22   [provider]  bumetanide  (BUMEX ) 2 MG tablet Take 10 mg by mouth daily.    [provider]  carvedilol  (COREG ) 25 MG tablet Take 25 mg by mouth 2 (two) times daily with a meal.    [provider]  DULoxetine  (CYMBALTA ) 20 MG capsule Take 20 mg by mouth daily. 07/29/23   [provider]  fluticasone  (FLONASE ) 50 MCG/ACT nasal spray Place 2 sprays into both nostrils daily. 12/11/23 12/10/24  [provider]  HYDROcodone -acetaminophen  (NORCO/VICODIN) 5-325 MG tablet Take  1-2 tablets by mouth every 6 (six) hours as needed for moderate pain (pain score 4-6) or severe pain (pain score 7-10). 02/28/24   Elgergawy, Brayton GORMAN, MD  hydrOXYzine  (ATARAX ) 25 MG tablet Take 25 mg by mouth 3 (three) times daily as needed for anxiety, itching, nausea or vomiting.    [provider]  ketoconazole  (NIZORAL ) 2 % cream Apply 1 Application topically 2 (two) times daily. Apply 1gm twice daily to affected skin on feet 01/06/24   McCaughan, Dia D, DPM  lamoTRIgine  (LAMICTAL ) 200 MG tablet Take 1 tablet (200 mg total) by mouth daily. 10/29/23   Regalado, Belkys A, MD  LANTUS  SOLOSTAR 100 UNIT/ML Solostar Pen Inject 10 Units into the skin daily. 02/07/24   Drusilla Sabas GORMAN, MD  metoCLOPramide  (REGLAN ) 10 MG tablet Take 0.5 tablets (5 mg total) by mouth every 8 (eight) hours as needed for nausea or refractory nausea / vomiting. 02/07/24   Drusilla Sabas GORMAN, MD  NOVOLOG  FLEXPEN 100 UNIT/ML FlexPen INJECT 5 UNITS UNDER THE SKIN 3 TIMES A DAY BEFORE MEALS. IF YOU DO NOT EAT A MEAL, PLEASE CHEECK YOUR BLOOD SUGAR AND GIVE YOURSELF 1 UNIT OF INSULIN  FOR EVERY 50MG /DL GREATER THAN 869. MAX 30 UNITS PER DAY. 01/18/24   [provider]  OLANZapine  zydis (ZYPREXA ) 5 MG disintegrating tablet Take 5 mg by mouth at bedtime. 09/23/23 02/08/24  [provider]  pantoprazole  (PROTONIX ) 40 MG tablet Take 1 tablet (40 mg total) by mouth 2 (two) times daily. 02/07/24  04/10/24  Drusilla Sabas RAMAN, MD  prochlorperazine  (COMPAZINE ) 5 MG tablet Take 5 mg by mouth every 6 (six) hours as needed for nausea or vomiting.    [provider]  rosuvastatin  (CRESTOR ) 40 MG tablet Take 40 mg by mouth daily. 01/20/24 01/19/25  [provider]  sacubitril -valsartan  (ENTRESTO ) 24-26 MG Take 1 tablet by mouth 2 (two) times daily.    [provider]  sevelamer  carbonate (RENVELA ) 800 MG tablet Take 1 tablet (800 mg total) by mouth 3 (three) times daily with meals. 02/07/24   Drusilla Sabas RAMAN, MD   sodium bicarbonate  650 MG tablet Take 650 mg by mouth 2 (two) times daily. 07/29/23   [provider]  sucralfate  (CARAFATE ) 1 GM/10ML suspension Take 10 mLs (1 g total) by mouth 2 (two) times daily for 14 days. 02/07/24 03/16/24  Drusilla Sabas RAMAN, MD  SUMAtriptan  (IMITREX ) 50 MG tablet Take 50 mg by mouth every 2 (two) hours as needed for migraine or headache. 06/20/22   [provider]    Allergies: Keflex  [cephalexin ], Penicillins, Vibramycin  [doxycycline ], Benadryl  [diphenhydramine ], Dilaudid  [hydromorphone ], Methotrexate and trimetrexate, and Roxicodone  [oxycodone ]    Review of Systems  Respiratory:  Positive for cough.   Gastrointestinal:  Positive for abdominal pain.  Musculoskeletal:  Positive for back pain.  All other systems reviewed and are negative.   Updated Vital Signs BP (!) 174/113 (BP Location: Left Arm)   Pulse 82   Temp (!) 96.7 F (35.9 C) (Axillary)   Resp 18   Ht 5' 3 (1.6 m)   Wt 69 kg   SpO2 100%   BMI 26.95 kg/m   Physical Exam Vitals and nursing note reviewed.  Constitutional:      Appearance: She is well-developed.  HENT:     Head: Normocephalic and atraumatic.  Eyes:     Conjunctiva/sclera: Conjunctivae normal.     Pupils: Pupils are equal, round, and reactive to light.  Cardiovascular:     Rate and Rhythm: Normal rate and regular rhythm.     Heart sounds: Normal heart sounds.  Pulmonary:     Effort: Pulmonary effort is normal.     Breath sounds: Normal breath sounds.     Comments: Repetitive dry cough during exam, able to speak in sentences, sats 100% on RA Abdominal:     General: Bowel sounds are normal.     Palpations: Abdomen is soft.  Musculoskeletal:        General: Normal range of motion.     Cervical back: Normal range of motion.     Comments: HD access right upper arm, thrill present  Skin:    General: Skin is warm and dry.  Neurological:     Mental Status: She is alert and oriented to person, place, and time.      (all labs ordered are listed, but only abnormal results are displayed) Labs Reviewed  CBC - Abnormal; Notable for the following components:      Result Value   WBC 13.4 (*)    Hemoglobin 11.1 (*)    MCHC 29.8 (*)    RDW 15.9 (*)    nRBC 0.4 (*)    All other components within normal limits  COMPREHENSIVE METABOLIC PANEL WITH GFR - Abnormal; Notable for the following components:   Sodium 134 (*)    Potassium 5.3 (*)    Chloride 94 (*)    CO2 21 (*)    Glucose, Bld 461 (*)    BUN 42 (*)  Creatinine, Ser 7.74 (*)    Calcium  8.6 (*)    Total Protein 5.9 (*)    Albumin  3.1 (*)    AST 93 (*)    Alkaline Phosphatase 745 (*)    GFR, Estimated 7 (*)    Anion gap 19 (*)    All other components within normal limits  BETA-HYDROXYBUTYRIC ACID - Abnormal; Notable for the following components:   Beta-Hydroxybutyric Acid 0.69 (*)    All other components within normal limits  BRAIN NATRIURETIC PEPTIDE - Abnormal; Notable for the following components:   B Natriuretic Peptide >4,500.0 (*)    All other components within normal limits  CBG MONITORING, ED - Abnormal; Notable for the following components:   Glucose-Capillary 413 (*)    All other components within normal limits  I-STAT VENOUS BLOOD GAS, ED - Abnormal; Notable for the following components:   Sodium 133 (*)    Potassium 5.3 (*)    Calcium , Ion 1.04 (*)    All other components within normal limits  I-STAT CHEM 8, ED - Abnormal; Notable for the following components:   Sodium 134 (*)    Potassium 5.4 (*)    BUN 42 (*)    Creatinine, Ser 7.50 (*)    Glucose, Bld 438 (*)    Calcium , Ion 1.03 (*)    All other components within normal limits  RESP PANEL BY RT-PCR (RSV, FLU A&B, COVID)  [id number]  HCG, SERUM, QUALITATIVE  URINALYSIS, ROUTINE W REFLEX MICROSCOPIC  CBG MONITORING, ED    EKG: None  Radiology: DG Chest Portable 1 View Result Date: 03/21/2024 EXAM: 1 VIEW(S) XRAY OF THE CHEST 03/21/2024 07:26:00 PM  COMPARISON: 03/16/2024 CLINICAL HISTORY: Shortness of breath FINDINGS: LUNGS AND PLEURA: Low lung volumes. Airspace disease in the right lower lobe concerning for pneumonia. No pleural effusion. No pneumothorax. HEART AND MEDIASTINUM: Cardiomegaly. BONES AND SOFT TISSUES: No acute osseous abnormality. IMPRESSION: 1. Airspace disease in the right lower lobe concerning for pneumonia. 2. Cardiomegaly. Electronically signed by: Franky Crease MD 03/21/2024 07:33 PM EST RP Workstation: [id number]    {Document cardiac monitor, telemetry assessment procedure when appropriate:32947} Procedures   CRITICAL CARE Performed by: Olam CHRISTELLA Slocumb   Total critical care time: 45 minutes  Critical care time was exclusive of separately billable procedures and treating other patients.  Critical care was necessary to treat or prevent imminent or life-threatening deterioration.  Critical care was time spent personally by me on the following activities: development of treatment plan with patient and/or surrogate as well as nursing, discussions with consultants, evaluation of patient's response to treatment, examination of patient, obtaining history from patient or surrogate, ordering and performing treatments and interventions, ordering and review of laboratory studies, ordering and review of radiographic studies, pulse oximetry and re-evaluation of patient's condition.   Medications Ordered in the ED  HYDROcodone -acetaminophen  (NORCO/VICODIN) 5-325 MG per tablet 1 tablet (0 tablets Oral Hold 03/21/24 1944)      {Click here for ABCD2, HEART and other calculators REFRESH Note before signing:1}                              Medical Decision Making Amount and/or Complexity of Data Reviewed Labs: ordered. Radiology: ordered and independent interpretation performed. ECG/medicine tests: ordered and independent interpretation performed.  Risk Decision regarding hospitalization.   33 year old female presenting to the  ED after missing HD session today.  She reports she has been feeling poorly for the past  several days and today did not have a ride.  She thought she would be okay but has had increased abdominal swelling.  States this happens when she misses dialysis.  She is afebrile and nontoxic in appearance here.  She does have a repetitive dry cough on exam but no production of mucus.  Sats remain 100% on room air.  Lungs are clear.  HD site in right upper arm, thrill present.  Labs as above--does have leukocytosis at 13.4.  Potassium is 5.3.  She is hyperglycemic in the 400s.  Anion gap 19 but beta hydroxy is only 0.69, pH is normal on VBG so doubt DKA.  CXR with concern for RLL pneumonia.    Have notified nephrology, Dr. Geralynn that patient will need make-up HD session, they will get this arranged for AM.  Final diagnoses:  None    ED Discharge Orders     None

## 2024-03-22 ENCOUNTER — Emergency Department (HOSPITAL_COMMUNITY)

## 2024-03-22 ENCOUNTER — Encounter (HOSPITAL_COMMUNITY): Payer: Self-pay

## 2024-03-22 ENCOUNTER — Inpatient Hospital Stay (HOSPITAL_COMMUNITY)

## 2024-03-22 DIAGNOSIS — I5033 Acute on chronic diastolic (congestive) heart failure: Secondary | ICD-10-CM | POA: Diagnosis not present

## 2024-03-22 DIAGNOSIS — J189 Pneumonia, unspecified organism: Secondary | ICD-10-CM | POA: Diagnosis present

## 2024-03-22 LAB — GLUCOSE, CAPILLARY
Glucose-Capillary: 241 mg/dL — ABNORMAL HIGH (ref 70–99)
Glucose-Capillary: 135 mg/dL — ABNORMAL HIGH (ref 70–99)
Glucose-Capillary: 136 mg/dL — ABNORMAL HIGH (ref 70–99)
Glucose-Capillary: 14 mg/dL — CL (ref 70–99)
Glucose-Capillary: 149 mg/dL — ABNORMAL HIGH (ref 70–99)
Glucose-Capillary: 42 mg/dL — CL (ref 70–99)
Glucose-Capillary: 96 mg/dL (ref 70–99)

## 2024-03-22 LAB — RESP PANEL BY RT-PCR (RSV, FLU A&B, COVID)  RVPGX2
Influenza A by PCR: NEGATIVE
Influenza B by PCR: NEGATIVE
Resp Syncytial Virus by PCR: NEGATIVE
SARS Coronavirus 2 by RT PCR: NEGATIVE

## 2024-03-22 LAB — ETHANOL: Alcohol, Ethyl (B): <15 mg/dL (ref <15)

## 2024-03-22 MED ORDER — DEXTROSE 50 % IV SOLN: 25.0000 g | INTRAVENOUS | Status: AC

## 2024-03-22 MED ORDER — ALBUTEROL SULFATE HFA 108 (90 BASE) MCG/ACT IN AERS: 2.0000 | INHALATION_SPRAY | RESPIRATORY_TRACT | Status: DC | PRN

## 2024-03-22 MED ORDER — PANTOPRAZOLE SODIUM 40 MG PO TBEC
40.0000 mg | DELAYED_RELEASE_TABLET | Freq: Two times a day (BID) | ORAL | Status: DC
  Administered 2024-03-22 – 2024-03-24 (×6): 40 mg via ORAL
  Filled 2024-03-22 (×6): qty 1

## 2024-03-22 MED ORDER — HEPARIN SODIUM (PORCINE) 1000 UNIT/ML DIALYSIS
1000.0000 [IU] | INTRAMUSCULAR | Status: DC | PRN
Start: 2024-03-22 — End: 2024-03-22

## 2024-03-22 MED ORDER — CHLORHEXIDINE GLUCONATE CLOTH 2 % EX PADS
6.0000 | MEDICATED_PAD | Freq: Every day | CUTANEOUS | Status: DC
  Administered 2024-03-23 – 2024-03-24 (×2): 6 via TOPICAL

## 2024-03-22 MED ORDER — NEPRO/CARBSTEADY PO LIQD: 237.0000 mL | ORAL | Status: DC | PRN

## 2024-03-22 MED ORDER — INSULIN GLARGINE-YFGN 100 UNIT/ML ~~LOC~~ SOLN
10.0000 [IU] | Freq: Every day | SUBCUTANEOUS | Status: DC
  Administered 2024-03-22: 10 [IU] via SUBCUTANEOUS
  Filled 2024-03-22 (×3): qty 0.1

## 2024-03-22 MED ORDER — SACUBITRIL-VALSARTAN 24-26 MG PO TABS
1.0000 | ORAL_TABLET | Freq: Two times a day (BID) | ORAL | Status: DC
  Administered 2024-03-22 – 2024-03-24 (×4): 1 via ORAL
  Filled 2024-03-22 (×4): qty 1

## 2024-03-22 MED ORDER — HEPARIN SODIUM (PORCINE) 5000 UNIT/ML IJ SOLN
5000.0000 [IU] | Freq: Three times a day (TID) | INTRAMUSCULAR | Status: DC
  Administered 2024-03-22 – 2024-03-24 (×6): 5000 [IU] via SUBCUTANEOUS
  Filled 2024-03-22 (×7): qty 1

## 2024-03-22 MED ORDER — INSULIN ASPART 100 UNIT/ML IJ SOLN
5.0000 [IU] | Freq: Three times a day (TID) | INTRAMUSCULAR | Status: DC
  Administered 2024-03-22 – 2024-03-23 (×3): 5 [IU] via SUBCUTANEOUS
  Filled 2024-03-22 (×2): qty 5

## 2024-03-22 MED ORDER — ALBUMIN HUMAN 25 % IV SOLN
12.5000 g | Freq: Once | INTRAVENOUS | Status: AC
  Administered 2024-03-22: 12.5 g via INTRAVENOUS
  Filled 2024-03-22: qty 50

## 2024-03-22 MED ORDER — LIDOCAINE HCL (PF) 1 % IJ SOLN: 5.0000 mL | INTRAMUSCULAR | Status: DC | PRN

## 2024-03-22 MED ORDER — ALBUTEROL SULFATE (2.5 MG/3ML) 0.083% IN NEBU: 2.5000 mg | INHALATION_SOLUTION | RESPIRATORY_TRACT | Status: DC | PRN

## 2024-03-22 MED ORDER — HEPARIN SODIUM (PORCINE) 1000 UNIT/ML DIALYSIS
2000.0000 [IU] | Freq: Once | INTRAMUSCULAR | Status: AC
  Administered 2024-03-22: 2000 [IU] via INTRAVENOUS_CENTRAL

## 2024-03-22 MED ORDER — DEXTROSE 50 % IV SOLN
INTRAVENOUS | Status: AC
Start: 2024-03-22 — End: 2024-03-22
  Administered 2024-03-22: 25 g via INTRAVENOUS
  Filled 2024-03-22: qty 50

## 2024-03-22 MED ORDER — OLANZAPINE 5 MG PO TBDP
5.0000 mg | ORAL_TABLET | Freq: Every day | ORAL | Status: DC
  Administered 2024-03-22 – 2024-03-23 (×2): 5 mg via ORAL
  Filled 2024-03-22 (×3): qty 1

## 2024-03-22 MED ORDER — ACETAMINOPHEN 325 MG PO TABS: 650.0000 mg | ORAL_TABLET | Freq: Four times a day (QID) | ORAL | Status: DC | PRN

## 2024-03-22 MED ORDER — OXYCODONE HCL 5 MG PO TABS: 5.0000 mg | ORAL_TABLET | ORAL | Status: DC | PRN

## 2024-03-22 MED ORDER — HEPARIN SODIUM (PORCINE) 1000 UNIT/ML DIALYSIS: 1500.0000 [IU] | INTRAMUSCULAR | Status: DC | PRN

## 2024-03-22 MED ORDER — ALTEPLASE 2 MG IJ SOLR: 2.0000 mg | Freq: Once | INTRAMUSCULAR | Status: DC | PRN

## 2024-03-22 MED ORDER — BUMETANIDE 0.25 MG/ML IJ SOLN
1.0000 mg | Freq: Two times a day (BID) | INTRAMUSCULAR | Status: DC
  Administered 2024-03-22 – 2024-03-24 (×6): 1 mg via INTRAVENOUS
  Filled 2024-03-22 (×8): qty 4

## 2024-03-22 MED ORDER — HYDROXYZINE HCL 25 MG PO TABS: 25.0000 mg | ORAL_TABLET | Freq: Three times a day (TID) | ORAL | Status: DC | PRN

## 2024-03-22 MED ORDER — LAMOTRIGINE 100 MG PO TABS
200.0000 mg | ORAL_TABLET | Freq: Every day | ORAL | Status: DC
  Administered 2024-03-22 – 2024-03-24 (×3): 200 mg via ORAL
  Filled 2024-03-22 (×3): qty 2

## 2024-03-22 MED ORDER — SODIUM CHLORIDE 0.9 % IV SOLN
2.0000 g | INTRAVENOUS | Status: DC
  Administered 2024-03-22: 2 g via INTRAVENOUS
  Filled 2024-03-22: qty 20

## 2024-03-22 MED ORDER — DULOXETINE HCL 20 MG PO CPEP
20.0000 mg | ORAL_CAPSULE | Freq: Every day | ORAL | Status: DC
  Administered 2024-03-22 – 2024-03-24 (×3): 20 mg via ORAL
  Filled 2024-03-22 (×3): qty 1

## 2024-03-22 MED ORDER — SEVELAMER CARBONATE 800 MG PO TABS
800.0000 mg | ORAL_TABLET | Freq: Three times a day (TID) | ORAL | Status: DC
  Administered 2024-03-22 – 2024-03-24 (×6): 800 mg via ORAL
  Filled 2024-03-22 (×6): qty 1

## 2024-03-22 MED ORDER — ACETAMINOPHEN 650 MG RE SUPP: 650.0000 mg | Freq: Four times a day (QID) | RECTAL | Status: DC | PRN

## 2024-03-22 MED ORDER — PENTAFLUOROPROP-TETRAFLUOROETH EX AERO: 1.0000 | INHALATION_SPRAY | CUTANEOUS | Status: DC | PRN

## 2024-03-22 MED ORDER — PROCHLORPERAZINE EDISYLATE 10 MG/2ML IJ SOLN: 10.0000 mg | Freq: Four times a day (QID) | INTRAMUSCULAR | Status: DC | PRN

## 2024-03-22 MED ORDER — CARVEDILOL 25 MG PO TABS
25.0000 mg | ORAL_TABLET | Freq: Two times a day (BID) | ORAL | Status: DC
  Administered 2024-03-22 – 2024-03-24 (×5): 25 mg via ORAL
  Filled 2024-03-22 (×5): qty 1

## 2024-03-22 MED ORDER — LIDOCAINE-PRILOCAINE 2.5-2.5 % EX CREA: 1.0000 | TOPICAL_CREAM | CUTANEOUS | Status: DC | PRN

## 2024-03-22 MED ORDER — FLUTICASONE PROPIONATE 50 MCG/ACT NA SUSP
2.0000 | Freq: Every day | NASAL | Status: DC
  Administered 2024-03-22 – 2024-03-24 (×3): 2 via NASAL
  Filled 2024-03-22: qty 16

## 2024-03-22 MED ORDER — SODIUM BICARBONATE 650 MG PO TABS
650.0000 mg | ORAL_TABLET | Freq: Two times a day (BID) | ORAL | Status: DC
  Administered 2024-03-22 – 2024-03-24 (×6): 650 mg via ORAL
  Filled 2024-03-22 (×6): qty 1

## 2024-03-22 MED ORDER — MORPHINE SULFATE (PF) 2 MG/ML IV SOLN
2.0000 mg | INTRAVENOUS | Status: DC | PRN
  Administered 2024-03-22 – 2024-03-24 (×7): 2 mg via INTRAVENOUS
  Filled 2024-03-22 (×6): qty 1

## 2024-03-22 MED ORDER — ANTICOAGULANT SODIUM CITRATE 4% (200MG/5ML) IV SOLN: 5.0000 mL | Status: DC | PRN

## 2024-03-22 NOTE — Plan of Care (Signed)

## 2024-03-22 NOTE — ED Notes (Signed)
 CCMD updated

## 2024-03-22 NOTE — Progress Notes (Signed)
   03/22/24 1137  Vitals  Temp 98 F (36.7 C)  Pulse Rate 90  Resp (!) 22  BP (!) 189/93  SpO2 99 %  Weight 65 kg  Type of Weight Post-Dialysis  Oxygen Therapy  Patient Activity (if Appropriate) In bed  Pulse Oximetry Type Continuous  Oximetry Probe Site Changed No  Post Treatment  Dialyzer Clearance Lightly streaked  Liters Processed 84  Fluid Removed (mL) 4000 mL  Tolerated HD Treatment Yes  AVG/AVF Arterial Site Held (minutes) 7 minutes  AVG/AVF Venous Site Held (minutes) 7 minutes   Received patient in bed to unit.  Alert and oriented.  Informed consent signed and in chart.   TX duration:3.5  Patient tolerated well.  Transported back to the room  Alert, without acute distress.  Hand-off given to patient's nurse. ---pt being admitted to 3e13  Access used: RUAG Access issues: no complications  Total UF removed: 4000 Medication(s) given: none   [facility]

## 2024-03-22 NOTE — Progress Notes (Signed)
 Pt found unresponsive and clammy. CBG=14. D50 admin per hypoglycemia protocol. CBG = 136. Pt remains very drowsy and talking but falls asleep while answering questions. She had a large amount of saliva coming out of her mouth when first found. She was able to tell me she has had seizures in the past with hypoglycemic events. She believes the last one was about 1 year ago. VS = BP (!) 203/118 (BP Location: Left Arm)   Pulse 90   Temp (!) 97.4 F (36.3 C) (Oral)   Resp 17   Ht 5' 3 (1.6 m)   Wt 65 kg   SpO2 93%   BMI 25.38 kg/m   Dr. Griffin notified.

## 2024-03-22 NOTE — H&P (Signed)
 History and Physical    Ashley Freeman FMW:[id number] DOB: [birth year] DOA: 03/21/2024  PCP: Keven Crumbly Pap, MD Patient coming from: Home  Chief Complaint: shortness of breath abdominal pain  HPI: Ashley Freeman is a 33 y.o. female with medical history significant of end-stage renal disease dialysis dependent, bipolar disorder, depression, type 1 diabetes, hypertension, hyperlipidemia and nonischemic cardiomyopathy with type II diastolic dysfunction who presented to the hospital with complaint of increasing difficulty breathing as well as abdominal pain and shortness of breath.  She states that she has not been feeling generally well at home and that she had missed a few of her dialysis sessions.  She did not admit to fevers or chills at home.  Did not state that she had any nausea vomiting or diarrhea.  ED Course:  In the ER, BP 111/73, HR 81, RR 13, O2 saturation 99% on RA,  and Tmax 97.5. Cbc demonstrated wbc 13.4,  hb/hct 11.1/37.3, and platelet 272. Beta hydroxybutyrate is 0.69. Chemistry demonstrated Na 134, K 5.3, Cl 94, bicarb 21, Bun/Cr 42/7.74 and glucose 461. Anion gap19. BNP >4500. CXR demonstrated airspace disease in the right lower lobe concerning for pneumonia.  Cardiomegaly.  CT abdomen pelvis demonstrated moderate to severe ascites, increased compared to prior exam.  Moderate right pleural effusion slightly increased compared to prior exam.  EKG demonstrated normal sinus rhythm with prolonged QT 501  Review of Systems:  All systems reviewed and apart from history of presenting illness, are negative.  Past Medical History:  Diagnosis Date   Abscess, gluteal, right 08/24/2013   Anemia 02/19/2012   Bartholin's gland abscess 09/19/2013   Bipolar disorder (HCC)    BV (bacterial vaginosis) 11/24/2015   Depression    Diabetes mellitus type I (HCC) 2001   Diagnosed at age 16 ; Type I   Diarrhea 05/30/2016   DKA (diabetic ketoacidoses) 08/19/2013    Also in 2018   ESRD (end stage renal disease) (HCC)    Gonorrhea 08/2011   Treated in 09/2011   HFrEF (heart failure with reduced ejection fraction) (HCC)    a. 2022 Echo: EF 40%; b. 10/2021 Echo: EF 55%; b. 07/2022 MV: No ischemia. EF 31%; c. 08/2022 Echo: EF 35%, mildly dil RV, sev TR.   History of trichomoniasis 05/31/2016   Hyperlipidemia 03/28/2016   Hypertension    NICM (nonischemic cardiomyopathy) (HCC)    Sepsis (HCC) 09/19/2013    Past Surgical History:  Procedure Laterality Date   A/V FISTULAGRAM Right 06/17/2023   Procedure: A/V Fistulagram;  Surgeon: Marea Selinda GORMAN, MD;  Location: [facility] INVASIVE CV LAB;  Service: Cardiovascular;  Laterality: Right;   A/V SHUNT INTERVENTION N/A 09/25/2023   Procedure: A/V SHUNT INTERVENTION;  Surgeon: Pearline Norman GORMAN, MD;  Location: [facility] PV LAB;  Service: Cardiovascular;  Laterality: N/A;   AV FISTULA PLACEMENT Right 07/06/2022   Procedure: ARTERIOVENOUS GRAFT CREATION;  Surgeon: Gretta Lonni PARAS, MD;  Location: [facility] OR;  Service: Vascular;  Laterality: Right;   CESAREAN SECTION N/A 10/05/2019   Procedure: CESAREAN SECTION;  Surgeon: Izell Harari, MD;  Location: [facility] LD ORS;  Service: Obstetrics;  Laterality: N/A;   CHOLECYSTECTOMY N/A 07/02/2023   Procedure: LAPAROSCOPIC CHOLECYSTECTOMY;  Surgeon: Ebbie Cough, MD;  Location: [facility] OR;  Service: General;  Laterality: N/A;   ESOPHAGOGASTRODUODENOSCOPY N/A 01/31/2024   Procedure: EGD (ESOPHAGOGASTRODUODENOSCOPY);  Surgeon: San Sandor GAILS, DO;  Location: [facility] ENDOSCOPY;  Service: Gastroenterology;  Laterality: N/A;   INCISION AND DRAINAGE ABSCESS Left 09/28/2019   Procedure: INCISION  AND DRAINAGE VULVAR ABCESS;  Surgeon: Edsel Norleen GAILS, MD;  Location: [facility] OR;  Service: Gynecology;  Laterality: Left;   INCISION AND DRAINAGE ABSCESS Right 02/23/2024   Procedure: INCISION AND DRAINAGE, ABSCESS;  Surgeon: Tobie Eldora NOVAK, MD;  Location: [facility] OR;  Service: ENT;  Laterality: Right;   INCISION AND  DRAINAGE PERIRECTAL ABSCESS Right 08/18/2013   Procedure: IRRIGATION AND DEBRIDEMENT GLUTEAL ABSCESS;  Surgeon: Lynda Leos, MD;  Location: [facility] OR;  Service: General;  Laterality: Right;   INCISION AND DRAINAGE PERIRECTAL ABSCESS Right 09/19/2013   Procedure: IRRIGATION AND DEBRIDEMENT RIGHT GLUTEAL AND LABIAL ABSCESSES;  Surgeon: Lynda Leos, MD;  Location: [facility] OR;  Service: General;  Laterality: Right;   INCISION AND DRAINAGE PERIRECTAL ABSCESS Right 09/24/2013   Procedure: IRRIGATION AND DEBRIDEMENT PERIRECTAL ABSCESS;  Surgeon: Lynwood MALVA Pina, MD;  Location: [facility] OR;  Service: General;  Laterality: Right;   IR PARACENTESIS  08/28/2023   IR PARACENTESIS  11/04/2023   IR PARACENTESIS  12/23/2023   IR PARACENTESIS  02/04/2024     reports that she has never smoked. She has never been exposed to tobacco smoke. She has never used smokeless tobacco. She reports that she does not currently use alcohol . She reports that she does not use drugs.  Allergies  Allergen Reactions   Keflex  [Cephalexin ] Anaphylaxis    Ceftriaxone  in the past with no reaction   Penicillins Anaphylaxis, Hives and Rash   Vibramycin  [Doxycycline ] Anaphylaxis   Benadryl  [Diphenhydramine ] Itching   Dilaudid  [Hydromorphone ] Itching   Methotrexate And Trimetrexate Rash   Roxicodone  [Oxycodone ] Itching    Takes Percocet without issue    Family History  Problem Relation Age of Onset   Asthma Mother    Carpal tunnel syndrome Mother    Gout Father    Diabetes Paternal Grandmother    Anesthesia problems Neg Hx     Prior to Admission medications   Medication Sig Start Date End Date Taking? Authorizing Provider  albuterol  (VENTOLIN  HFA) 108 (90 Base) MCG/ACT inhaler Inhale 2 puffs into the lungs every 4 (four) hours as needed for wheezing or shortness of breath. 11/24/22   [provider]  bumetanide  (BUMEX ) 2 MG tablet Take 10 mg by mouth daily.    [provider]  carvedilol  (COREG ) 25 MG tablet Take 25  mg by mouth 2 (two) times daily with a meal.    [provider]  DULoxetine  (CYMBALTA ) 20 MG capsule Take 20 mg by mouth daily. 07/29/23   [provider]  fluticasone  (FLONASE ) 50 MCG/ACT nasal spray Place 2 sprays into both nostrils daily. 12/11/23 12/10/24  [provider]  HYDROcodone -acetaminophen  (NORCO/VICODIN) 5-325 MG tablet Take 1-2 tablets by mouth every 6 (six) hours as needed for moderate pain (pain score 4-6) or severe pain (pain score 7-10). 02/28/24   Elgergawy, Brayton RAMAN, MD  hydrOXYzine  (ATARAX ) 25 MG tablet Take 25 mg by mouth 3 (three) times daily as needed for anxiety, itching, nausea or vomiting.    [provider]  ketoconazole  (NIZORAL ) 2 % cream Apply 1 Application topically 2 (two) times daily. Apply 1gm twice daily to affected skin on feet 01/06/24   McCaughan, Dia D, DPM  lamoTRIgine  (LAMICTAL ) 200 MG tablet Take 1 tablet (200 mg total) by mouth daily. 10/29/23   Regalado, Belkys A, MD  LANTUS  SOLOSTAR 100 UNIT/ML Solostar Pen Inject 10 Units into the skin daily. 02/07/24   Drusilla Sabas RAMAN, MD  metoCLOPramide  (REGLAN ) 10 MG tablet Take 0.5 tablets (5 mg total)  by mouth every 8 (eight) hours as needed for nausea or refractory nausea / vomiting. 02/07/24   Drusilla Sabas RAMAN, MD  NOVOLOG  FLEXPEN 100 UNIT/ML FlexPen INJECT 5 UNITS UNDER THE SKIN 3 TIMES A DAY BEFORE MEALS. IF YOU DO NOT EAT A MEAL, PLEASE CHEECK YOUR BLOOD SUGAR AND GIVE YOURSELF 1 UNIT OF INSULIN  FOR EVERY 50MG /DL GREATER THAN 869. MAX 30 UNITS PER DAY. 01/18/24   [provider]  OLANZapine  zydis (ZYPREXA ) 5 MG disintegrating tablet Take 5 mg by mouth at bedtime. 09/23/23 02/08/24  [provider]  pantoprazole  (PROTONIX ) 40 MG tablet Take 1 tablet (40 mg total) by mouth 2 (two) times daily. 02/07/24 04/10/24  Drusilla Sabas RAMAN, MD  prochlorperazine  (COMPAZINE ) 5 MG tablet Take 5 mg by mouth every 6 (six) hours as needed for nausea or vomiting.    [provider]   rosuvastatin  (CRESTOR ) 40 MG tablet Take 40 mg by mouth daily. 01/20/24 01/19/25  [provider]  sacubitril -valsartan  (ENTRESTO ) 24-26 MG Take 1 tablet by mouth 2 (two) times daily.    [provider]  sevelamer  carbonate (RENVELA ) 800 MG tablet Take 1 tablet (800 mg total) by mouth 3 (three) times daily with meals. 02/07/24   Drusilla Sabas RAMAN, MD  sodium bicarbonate  650 MG tablet Take 650 mg by mouth 2 (two) times daily. 07/29/23   [provider]  sucralfate  (CARAFATE ) 1 GM/10ML suspension Take 10 mLs (1 g total) by mouth 2 (two) times daily for 14 days. 02/07/24 03/16/24  Drusilla Sabas RAMAN, MD  SUMAtriptan  (IMITREX ) 50 MG tablet Take 50 mg by mouth every 2 (two) hours as needed for migraine or headache. 06/20/22   [provider]    Physical Exam: Vitals:   03/22/24 0030 03/22/24 0115 03/22/24 0230 03/22/24 0230  BP: (!) 163/150 (!) 149/81 (!) 91/53   Pulse: 76  82   Resp: 17 10 17    Temp:    (!) 97.5 F (36.4 C)  TempSrc:    Axillary  SpO2: 96%  96%   Weight:      Height:        Physical Exam Constitutional:      General: He is not in acute distress.    Appearance: Normal appearance.  HENT:     Head: Normocephalic and atraumatic.  Eyes:     Extraocular Movements: Extraocular movements intact.     Conjunctiva/sclera: Conjunctivae normal.     Pupils: Pupils are equal, round, and reactive to light.  Cardiovascular:     Rate and Rhythm: Normal rate and regular rhythm.     Pulses: Normal pulses.     Heart sounds: Normal heart sounds.  Pulmonary:     Effort: Pulmonary effort is normal. No respiratory distress.     Breath sounds: rales in lung bilaterally   Abdominal:     General: Abdomen is flat. Bowel sounds are normal. Moderate ascites    Palpations: Abdomen is soft.     Tenderness: There is no abdominal tenderness.  Musculoskeletal:        General: No deformity. Normal range of motion.  Skin:    General: Skin is warm and dry.      Coloration: Skin is not jaundiced.  Neurological:     General: No focal deficit present.     Mental Status: He is alert and oriented to person, place, and time. Mental status is at baseline.   Labs on Admission: I have personally reviewed following labs and imaging studies  CBC: Recent Labs  Lab 03/16/24 1608 03/16/24 1622 03/17/24 0746 03/21/24 2151 03/21/24 2200 03/21/24 2204  WBC 8.1  --  16.1* 13.4*  --   --   NEUTROABS 4.2  --   --   --   --   --   HGB 10.0* 11.9*  12.2 9.6* 11.1* 12.9 13.3  HCT 34.3* 35.0*  36.0 29.8* 37.3 38.0 39.0  MCV 94.5  --  85.6 89.9  --   --   PLT 265  --  291 272  --   --    Basic Metabolic Panel: Recent Labs  Lab 03/17/24 0130 03/17/24 1031 03/17/24 1240 03/18/24 0706 03/21/24 2151 03/21/24 2200 03/21/24 2204  NA 132* 134* 136 136 134* 133* 134*  K 4.7 3.6 4.2 4.4 5.3* 5.3* 5.4*  CL 95* 94* 98 97* 94*  --  101  CO2 14* 20* 23 22 21*  --   --   GLUCOSE 479* 111* 72 127* 461*  --  438*  BUN 57* 26* 26* 28* 42*  --  42*  CREATININE 9.18* 4.55* 5.13* 6.34* 7.74*  --  7.50*  CALCIUM  8.8* 8.7* 8.5* 8.5* 8.6*  --   --    GFR: Estimated Creatinine Clearance: 10.1 mL/min (A) (by C-G formula based on SCr of 7.5 mg/dL (H)). Liver Function Tests: Recent Labs  Lab 03/21/24 2151  AST 93*  ALT 31  ALKPHOS 745*  BILITOT 1.2  PROT 5.9*  ALBUMIN  3.1*   No results for input(s): LIPASE, AMYLASE in the last 168 hours. No results for input(s): AMMONIA in the last 168 hours. Coagulation Profile: No results for input(s): INR, PROTIME in the last 168 hours. Cardiac Enzymes: No results for input(s): CKTOTAL, CKMB, CKMBINDEX, TROPONINI in the last 168 hours. BNP (last 3 results) No results for input(s): PROBNP in the last 8760 hours. HbA1C: No results for input(s): HGBA1C in the last 72 hours. CBG: Recent Labs  Lab 03/18/24 0157 03/18/24 0646 03/18/24 0813 03/18/24 1152 03/21/24 2208  GLUCAP 117* 125* 137* 97 413*    Lipid Profile: No results for input(s): CHOL, HDL, LDLCALC, TRIG, CHOLHDL, LDLDIRECT in the last 72 hours. Thyroid  Function Tests: No results for input(s): TSH, T4TOTAL, FREET4, T3FREE, THYROIDAB in the last 72 hours. Anemia Panel: No results for input(s): VITAMINB12, FOLATE, FERRITIN, TIBC, IRON , RETICCTPCT in the last 72 hours. Urine analysis:    Component Value Date/Time   COLORURINE YELLOW 10/25/2023 2300   APPEARANCEUR CLOUDY (A) 10/25/2023 2300   APPEARANCEUR Hazy 11/10/2013 2043   LABSPEC 1.016 10/25/2023 2300   LABSPEC 1.031 11/10/2013 2043   PHURINE 6.0 10/25/2023 2300   GLUCOSEU >=500 (A) 10/25/2023 2300   GLUCOSEU >=500 11/10/2013 2043   HGBUR SMALL (A) 10/25/2023 2300   BILIRUBINUR NEGATIVE 10/25/2023 2300   BILIRUBINUR negative 06/04/2018 1035   BILIRUBINUR Negative 11/24/2015 1443   BILIRUBINUR Negative 11/10/2013 2043   KETONESUR 5 (A) 10/25/2023 2300   PROTEINUR >=300 (A) 10/25/2023 2300   UROBILINOGEN 0.2 09/14/2019 1732   NITRITE NEGATIVE 10/25/2023 2300   LEUKOCYTESUR NEGATIVE 10/25/2023 2300   LEUKOCYTESUR 1+ 11/10/2013 2043    Radiological Exams on Admission: CT ABDOMEN PELVIS WO CONTRAST Result Date: 03/22/2024 EXAM: CT ABDOMEN AND PELVIS WITHOUT CONTRAST 03/22/2024 01:57:31 AM TECHNIQUE: CT of the abdomen and pelvis was performed without the administration of intravenous contrast. Multiplanar reformatted images are provided for review. Automated exposure control, iterative reconstruction, and/or weight-based adjustment of the mA/kV was utilized to reduce the radiation dose to as  low as reasonably achievable. COMPARISON: 03/08/2024 CLINICAL HISTORY: Distended abdomen. FINDINGS: LOWER CHEST: Moderate right-sided pleural effusion is noted, slightly increased when compared with the prior exam. LIVER: The liver is within normal limits. GALLBLADDER AND BILE DUCTS: The gallbladder has been surgically removed. No biliary ductal  dilatation. SPLEEN: The spleen is unremarkable. PANCREAS: The pancreas is unremarkable. ADRENAL GLANDS: The adrenal glands are well visualized without acute abnormality. KIDNEYS, URETERS AND BLADDER: The kidneys are well visualized without acute abnormality. Renal vascular calcifications are seen. The bladder is partially distended. No stones in the kidneys or ureters. GI AND BOWEL: Stomach and small bowel are within normal limits. No obstructive or inflammatory changes of the colon are seen. The appendix is not well visualized and may have been surgically removed. No inflammatory changes to suggest appendicitis are seen. PERITONEUM AND RETROPERITONEUM: Moderate to severe ascites is noted, increased when compared with the prior exam. No free air. VASCULATURE: Diffuse vascular calcifications are noted in the visceral vessels. The aorta shows no aneurysmal dilatation. LYMPH NODES: No lymphadenopathy. REPRODUCTIVE ORGANS: The uterus and adnexa are within normal limits. BONES AND SOFT TISSUES: Changes of anasarca are noted in the subcutaneous tissues. IMPRESSION: 1. Moderate to severe ascites, increased compared to the prior exam. 2. Moderate right pleural effusion, slightly increased compared to the prior exam. Electronically signed by: Oneil Devonshire MD 03/22/2024 02:03 AM EST RP Workstation: [id number]   DG Chest Portable 1 View Result Date: 03/21/2024 EXAM: 1 VIEW(S) XRAY OF THE CHEST 03/21/2024 07:26:00 PM COMPARISON: 03/16/2024 CLINICAL HISTORY: Shortness of breath FINDINGS: LUNGS AND PLEURA: Low lung volumes. Airspace disease in the right lower lobe concerning for pneumonia. No pleural effusion. No pneumothorax. HEART AND MEDIASTINUM: Cardiomegaly. BONES AND SOFT TISSUES: No acute osseous abnormality. IMPRESSION: 1. Airspace disease in the right lower lobe concerning for pneumonia. 2. Cardiomegaly. Electronically signed by: Franky Crease MD 03/21/2024 07:33 PM EST RP Workstation: [id number]    EKG:  Independently reviewed.  Assessment/Plan Principal Problem:   Acute on chronic diastolic heart failure (HCC) Active Problems:   Diabetes mellitus type I (HCC)   ESRD on hemodialysis (HCC)   HFrEF (heart failure with reduced ejection fraction) (HCC)   Prolonged QT interval   Bipolar disorder (HCC)   Type 1 diabetes mellitus with hyperglycemia (HCC)   CAP (community acquired pneumonia)    Acute on chronic diastolic heart failure Will start on IV bumex  Will monitor in and out  Will place on fluid restriction Will start on heart healthy diet Sodium 2G per day Will obtain echocardiogram if needed Continue to monitor for clinical improvement   ESRD on HD Will consult nephrology Patient has missed dialysis a few days this week  DM type 1 Will start on lantus  10 unit daily She will have before insulin  SQ Will need correctional sliding scale insulin  as well   Prolonged QT interval QTC waas 501 Avoid Qt prolonging drugs   CAP Patient was started on rocephin  2G The plan was to add azithromycin  but her QTC was prolonged  Considered doxy but she is allergic Will just monitor on current antibiotic  Bipolar disorder Patient has been restarted on her lamictal   Will continue to monitor   Large volume ascites This could be related to volume overload I have ordered paracentesis  She will have dialysis but likely this is will not be enough Patient is on bumex   Moderate pleural effusion Also likely related to CHF Will have dialysis  Patient has been started on bumex  1 mg BID  DVT prophylaxis: heparin    Code Status: full  Family Communication:    [place],Latisha (Mother) [phone] (Home Phone)     Disposition Plan: Status is: Inpatient Remains inpatient appropriate because:  missed dialysis, acute on chronic heart failure and CAP  Consults called: nephrology Admission status: inpatient Level of care: Level of care: Progressive The medical decision making on  this patient was of high complexity and the patient is at high risk for clinical deterioration, therefore this is a level 3 visit.  The medical decision making is of moderate complexity, therefore this is a level 2 visit.  Bradly MARLA Drones MD [facility]  If 7PM-7AM, please contact night-coverage [email or web address]  03/22/2024, 3:20 AM

## 2024-03-22 NOTE — Hospital Course (Addendum)
 Ashley Freeman is a 33 y.o. female with PMH of ESRD on TTS HD, chronic HFrEF (EF 30-35%), T1DM, HTN, anemia of chronic renal disease, HLD, BPD, recent Right neck abscess s/p I&D 02/23/24 and recent admission for DKA 11/19 presented with missing dialysis because of not having a ride, her last dialysis was on Thursday and also feeling unwell, having ongoing cough, shortness of breath.  In the ED: VSS labs with leukocytosis beta SOB 0.6 hyperkalemia 5.3 gap 19 BNP >4500 CXR-airspace disease in the right lower lobe concerning for pneumonia.  Cardiomegaly. CT abdomen pelvis>>moderate to severe ascites, increased compared to prior exam.  Moderate right pleural effusion slightly increased compared to prior exam.  EKG demonstrated normal sinus rhythm with prolonged QT 501 Patient admitted for acute on chronic diastolic CHF possible CAP, moderate pleural effusion ascites Underwent paracentesis with improvement in the abdominal tightness and distention and pain Got dialyzed by nephrology. Initial concern for Gram stain positive but on further review by ID Gram stain negative As per ID okay for discharge home  Subjective: Seen and examined  Alert awake oriented blood sugar running high no episodes of hypoglycemia Abdomen is less tender less swollen after paracentesis yesterday Eager to go home  Discharge Diagnoses:   Acute on chronic systolic CHF Fluid overload Moderate pleural effusion Large volume ascites-recurrent Missed dialysis: Patient with signs symptoms of fluid overload multifactorial due to acute on chronic systolic CHF, ESRD with missed HD. nephrology on board going for HD 11/25.   Blood pressure stable after resuming home Coreg  and Entresto  Continue home diuretic regimen on d/c.  X-ray worsening bilateral airspace disease-possibly reflecting edema or infection S/p paracentesis with improvement in pain and swelling-fluid shows gram-positive cocci in Gram stain>Initial  concern for Gram stain positive but on further review by ID Gram stain negative. Regarding Ascites-recommend GI evaluation as outpatient, she had EGD 10/5.  2025 with hematemesis -I ordered internal referral for GI.  Suspected CAP: Will do po antibiotics for dc home -discussed with pharmacy patient has tolerated 3rd gen cephalosporin in past so will ado ceftin  250 mg daily and dc home once she tolerates.  ESRD on TTS HD Anemia of chronic renal disease Metabolic bone disease: Nephrology following got HD 11/23 and cont HD plan per nephro Continue sodium bicarb sevelamer  and monitor hemoglobin  T1DM w/ HLD with uncontrolled hyperglycemia and hypoglycemia, appears brittle DM: Recent admission for DKA.  PTA on Lantus  10 units daily and NovoLog  FlexPen 5 units Premeal Blood sugar as low as 14 on 11/23-following insulin  treatment for hyperkalemia.blood sugar trending up resuming 5 minutes Lantus  continue SSI  OFF D10 Recent Labs  Lab 03/23/24 2049 03/23/24 2204 03/24/24 0343 03/24/24 0624 03/24/24 1119  GLUCAP 269* 305* 363* 318* 129*    HTN: BP was poorly controlled and stabilized after resuming home Coreg  and Entresto  continue diuretics  BPD Seizure disorder: Continue her Lamictal , Zyprexa  at bedtime.  DVT prophylaxis: heparin  injection 5,000 Units Start: 03/22/24 0600 Code Status:   Code Status: Full Code Family Communication: plan of care discussed with patient at bedside. Patient status is: Remains hospitalized because of severity of illness Level of care: Progressive   Dispo: The patient is from: home            Anticipated disposition: TBD.  Objective: Vitals last 24 hrs: Vitals:   03/24/24 0720 03/24/24 1120 03/24/24 1328 03/24/24 1334  BP:   (!) 129/56 (!) 146/56  Pulse: 77 71 85 80  Resp: 16 17 18  17  Temp:   97.9 F (36.6 C)   TempSrc: Oral Oral    SpO2: 97% 98% 98% 96%  Weight:   59.7 kg   Height:       Physical Examination: General exam: aaox3, pleasant   HEENT:Oral mucosa moist, Ear/Nose WNL grossly Respiratory system: cta Bilaterally Cardiovascular system: S1 & S2 +, No JVD. Gastrointestinal system: Abdomen soft less distended less tender Nervous System: Alert, awake, moving all extremities,and following commands. Extremities: extremities warm, leg edema neg, RUE AVG+ Skin: Warm, no rashes MSK: Normal muscle bulk,tone, power   Medications reviewed:  Scheduled Meds:  bumetanide  (BUMEX ) IV  1 mg Intravenous Q12H   carvedilol   25 mg Oral BID WC   cefUROXime   250 mg Oral QHS   Chlorhexidine  Gluconate Cloth  6 each Topical Q0600   Chlorhexidine  Gluconate Cloth  6 each Topical Q0600   DULoxetine   20 mg Oral Daily   fluticasone   2 spray Each Nare Daily   heparin   2,500 Units Dialysis Once in dialysis   heparin   5,000 Units Subcutaneous Q8H   insulin  aspart  0-6 Units Subcutaneous TID WC   insulin  glargine-yfgn  5 Units Subcutaneous Q24H   lamoTRIgine   200 mg Oral Daily   OLANZapine  zydis  5 mg Oral QHS   pantoprazole   40 mg Oral BID   sacubitril -valsartan   1 tablet Oral BID   sevelamer  carbonate  800 mg Oral TID WC   sodium bicarbonate   650 mg Oral BID   Continuous Infusions:  anticoagulant sodium citrate      Diet: Diet Order             Diet renal/carb modified with fluid restriction           Diet renal/carb modified with fluid restriction Diet-HS Snack? Nothing; Fluid restriction: 1200 mL Fluid; Room service appropriate? Yes; Fluid consistency: Thin  Diet effective now

## 2024-03-22 NOTE — Procedures (Signed)
 S: pt seen in [facility] for dialysis. Pt is on HD, tolerating well for now.   Vitals:   03/22/24 1030 03/22/24 1100 03/22/24 1128 03/22/24 1137  BP: (!) 191/99 (!) 189/97 (!) 124/107 (!) 189/93  Pulse: (!) 102 86 89 90  Resp: 15 17 19  (!) 22  Temp:    98 F (36.7 C)  TempSrc:      SpO2: 100% 100% 100% 99%  Weight:    65 kg  Height:        Recent Labs  Lab 03/18/24 0706 03/21/24 2151 03/21/24 2200 03/21/24 2204  HGB  --  11.1* 12.9 13.3  ALBUMIN   --  3.1*  --   --   CALCIUM  8.5* 8.6*  --   --   CREATININE 6.34* 7.74*  --  7.50*  K 4.4 5.3* 5.3* 5.4*    Inpatient medications:  bumetanide  (BUMEX ) IV  1 mg Intravenous Q12H   Chlorhexidine  Gluconate Cloth  6 each Topical Q0600   [START ON 03/23/2024] Chlorhexidine  Gluconate Cloth  6 each Topical Q0600   DULoxetine   20 mg Oral Daily   fluticasone   2 spray Each Nare Daily   heparin   5,000 Units Subcutaneous Q8H   insulin  aspart  5 Units Subcutaneous TID WC   insulin  glargine-yfgn  10 Units Subcutaneous Daily   lamoTRIgine   200 mg Oral Daily   OLANZapine  zydis  5 mg Oral QHS   pantoprazole   40 mg Oral BID   sevelamer  carbonate  800 mg Oral TID WC   sodium bicarbonate   650 mg Oral BID    anticoagulant sodium citrate      cefTRIAXone  (ROCEPHIN )  IV     acetaminophen  **OR** acetaminophen , albuterol , alteplase , anticoagulant sodium citrate , feeding supplement (NEPRO CARB STEADY), heparin , heparin , hydrOXYzine , lidocaine  (PF), lidocaine -prilocaine , morphine  injection, pentafluoroprop-tetrafluoroeth, prochlorperazine   I was present at the procedure, reviewed the HD regimen and made appropriate changes.   Myer Fret MD  [facility] 03/22/2024, 12:10 PM

## 2024-03-22 NOTE — Consult Note (Addendum)
 Renal Service Consult Note [facility] [name], MD  Patient: Ashley Freeman Date: 03/22/2024 Requesting Physician: Dr. Christobal  Reason for Consult: ESRD pt w/ severe ascites, back/ abd pain HPI: The patient is a 33 y.o. year-old w/ PMH as below who presented to ED w/ SOB and abd pain early this am. In ED BP 111/73, HR 80, RR 13, 99% sat on RA. Temp 97.5. WBC 13k, Hb 11.1. BHB 0.69. K+ 5.3, creat 7.74.  BNP > 4500. CXR showed no edema, poss RLL pna. CT abd/ pelvis showed mod to severe ascites, worse than prior. Pt had missed HD recently. Pt was admitted for SOB and volume overload. We are asked to see for ESRD.     Pt seen in ED room. Pt denies severe SOB at rest. Stomach is very large, more than usual.  Did miss some OP HD this past week.    ROS - denies CP, no joint pain, no HA, no blurry vision, no rash, no diarrhea, no nausea/ vomiting   Past Medical History  Past Medical History:  Diagnosis Date   Abscess, gluteal, right 08/24/2013   Anemia 02/19/2012   Bartholin's gland abscess 09/19/2013   Bipolar disorder (HCC)    BV (bacterial vaginosis) 11/24/2015   Depression    Diabetes mellitus type I (HCC) 2001   Diagnosed at age 69 ; Type I   Diarrhea 05/30/2016   DKA (diabetic ketoacidoses) 08/19/2013   Also in 2018   ESRD (end stage renal disease) (HCC)    Gonorrhea 08/2011   Treated in 09/2011   HFrEF (heart failure with reduced ejection fraction) (HCC)    a. 2022 Echo: EF 40%; b. 10/2021 Echo: EF 55%; b. 07/2022 MV: No ischemia. EF 31%; c. 08/2022 Echo: EF 35%, mildly dil RV, sev TR.   History of trichomoniasis 05/31/2016   Hyperlipidemia 03/28/2016   Hypertension    NICM (nonischemic cardiomyopathy) (HCC)    Sepsis (HCC) 09/19/2013   Past Surgical History  Past Surgical History:  Procedure Laterality Date   A/V FISTULAGRAM Right 06/17/2023   Procedure: A/V Fistulagram;  Surgeon: Marea Selinda GORMAN, MD;  Location: [facility] INVASIVE CV LAB;  Service:  Cardiovascular;  Laterality: Right;   A/V SHUNT INTERVENTION N/A 09/25/2023   Procedure: A/V SHUNT INTERVENTION;  Surgeon: Pearline Norman GORMAN, MD;  Location: [facility] PV LAB;  Service: Cardiovascular;  Laterality: N/A;   AV FISTULA PLACEMENT Right 07/06/2022   Procedure: ARTERIOVENOUS GRAFT CREATION;  Surgeon: Gretta Lonni PARAS, MD;  Location: [facility] OR;  Service: Vascular;  Laterality: Right;   CESAREAN SECTION N/A 10/05/2019   Procedure: CESAREAN SECTION;  Surgeon: Izell Harari, MD;  Location: [facility] LD ORS;  Service: Obstetrics;  Laterality: N/A;   CHOLECYSTECTOMY N/A 07/02/2023   Procedure: LAPAROSCOPIC CHOLECYSTECTOMY;  Surgeon: Ebbie Cough, MD;  Location: [facility] OR;  Service: General;  Laterality: N/A;   ESOPHAGOGASTRODUODENOSCOPY N/A 01/31/2024   Procedure: EGD (ESOPHAGOGASTRODUODENOSCOPY);  Surgeon: San Sandor GAILS, DO;  Location[facility] ENDOSCOPY;  Service: Gastroenterology;  Laterality: N/A;   INCISION AND DRAINAGE ABSCESS Left 09/28/2019   Procedure: INCISION AND DRAINAGE VULVAR ABCESS;  Surgeon: Edsel Norleen GAILS, MD;  Location: [facility] OR;  Service: Gynecology;  Laterality: Left;   INCISION AND DRAINAGE ABSCESS Right 02/23/2024   Procedure: INCISION AND DRAINAGE, ABSCESS;  Surgeon: Tobie Eldora NOVAK, MD;  Location: [facility] OR;  Service: ENT;  Laterality: Right;   INCISION AND DRAINAGE PERIRECTAL ABSCESS Right 08/18/2013   Procedure: IRRIGATION AND DEBRIDEMENT GLUTEAL ABSCESS;  Surgeon: Lynda  Rubin, MD;  Location: [facility] OR;  Service: General;  Laterality: Right;   INCISION AND DRAINAGE PERIRECTAL ABSCESS Right 09/19/2013   Procedure: IRRIGATION AND DEBRIDEMENT RIGHT GLUTEAL AND LABIAL ABSCESSES;  Surgeon: Lynda Rubin, MD;  Location: [facility] OR;  Service: General;  Laterality: Right;   INCISION AND DRAINAGE PERIRECTAL ABSCESS Right 09/24/2013   Procedure: IRRIGATION AND DEBRIDEMENT PERIRECTAL ABSCESS;  Surgeon: Lynwood MALVA Pina, MD;  Location: [facility] OR;  Service: General;  Laterality: Right;   IR PARACENTESIS   08/28/2023   IR PARACENTESIS  11/04/2023   IR PARACENTESIS  12/23/2023   IR PARACENTESIS  02/04/2024   Family History  Family History  Problem Relation Age of Onset   Asthma Mother    Carpal tunnel syndrome Mother    Gout Father    Diabetes Paternal Grandmother    Anesthesia problems Neg Hx    Social History  reports that she has never smoked. She has never been exposed to tobacco smoke. She has never used smokeless tobacco. She reports that she does not currently use alcohol . She reports that she does not use drugs. Allergies  Allergies  Allergen Reactions   Keflex  [Cephalexin ] Anaphylaxis    Ceftriaxone  in the past with no reaction   Penicillins Anaphylaxis, Hives and Rash   Vibramycin  [Doxycycline ] Anaphylaxis   Benadryl  [Diphenhydramine ] Itching   Dilaudid  [Hydromorphone ] Itching   Methotrexate And Trimetrexate Rash   Roxicodone  [Oxycodone ] Itching    Takes Percocet without issue   Home medications Prior to Admission medications   Medication Sig Start Date End Date Taking? Authorizing Provider  albuterol  (VENTOLIN  HFA) 108 (90 Base) MCG/ACT inhaler Inhale 2 puffs into the lungs every 4 (four) hours as needed for wheezing or shortness of breath. 11/24/22   [provider]  bumetanide  (BUMEX ) 2 MG tablet Take 10 mg by mouth daily.    [provider]  carvedilol  (COREG ) 25 MG tablet Take 25 mg by mouth 2 (two) times daily with a meal.    [provider]  DULoxetine  (CYMBALTA ) 20 MG capsule Take 20 mg by mouth daily. 07/29/23   [provider]  fluticasone  (FLONASE ) 50 MCG/ACT nasal spray Place 2 sprays into both nostrils daily. 12/11/23 12/10/24  [provider]  HYDROcodone -acetaminophen  (NORCO/VICODIN) 5-325 MG tablet Take 1-2 tablets by mouth every 6 (six) hours as needed for moderate pain (pain score 4-6) or severe pain (pain score 7-10). 02/28/24   Elgergawy, Brayton RAMAN, MD  hydrOXYzine  (ATARAX ) 25 MG tablet Take 25 mg by mouth 3 (three)  times daily as needed for anxiety, itching, nausea or vomiting.    [provider]  ketoconazole  (NIZORAL ) 2 % cream Apply 1 Application topically 2 (two) times daily. Apply 1gm twice daily to affected skin on feet 01/06/24   McCaughan, Dia D, DPM  lamoTRIgine  (LAMICTAL ) 200 MG tablet Take 1 tablet (200 mg total) by mouth daily. 10/29/23   Regalado, Belkys A, MD  LANTUS  SOLOSTAR 100 UNIT/ML Solostar Pen Inject 10 Units into the skin daily. 02/07/24   Drusilla Sabas RAMAN, MD  metoCLOPramide  (REGLAN ) 10 MG tablet Take 0.5 tablets (5 mg total) by mouth every 8 (eight) hours as needed for nausea or refractory nausea / vomiting. 02/07/24   Drusilla Sabas RAMAN, MD  NOVOLOG  FLEXPEN 100 UNIT/ML FlexPen INJECT 5 UNITS UNDER THE SKIN 3 TIMES A DAY BEFORE MEALS. IF YOU DO NOT EAT A MEAL, PLEASE CHEECK YOUR BLOOD SUGAR AND GIVE YOURSELF 1 UNIT OF INSULIN  FOR EVERY 50MG /DL GREATER THAN 869. MAX  30 UNITS PER DAY. 01/18/24   [provider]  OLANZapine  zydis (ZYPREXA ) 5 MG disintegrating tablet Take 5 mg by mouth at bedtime. 09/23/23 02/08/24  [provider]  pantoprazole  (PROTONIX ) 40 MG tablet Take 1 tablet (40 mg total) by mouth 2 (two) times daily. 02/07/24 04/10/24  Drusilla Sabas RAMAN, MD  prochlorperazine  (COMPAZINE ) 5 MG tablet Take 5 mg by mouth every 6 (six) hours as needed for nausea or vomiting.    [provider]  rosuvastatin  (CRESTOR ) 40 MG tablet Take 40 mg by mouth daily. 01/20/24 01/19/25  [provider]  sacubitril -valsartan  (ENTRESTO ) 24-26 MG Take 1 tablet by mouth 2 (two) times daily.    [provider]  sevelamer  carbonate (RENVELA ) 800 MG tablet Take 1 tablet (800 mg total) by mouth 3 (three) times daily with meals. 02/07/24   Drusilla Sabas RAMAN, MD  sodium bicarbonate  650 MG tablet Take 650 mg by mouth 2 (two) times daily. 07/29/23   [provider]  sucralfate  (CARAFATE ) 1 GM/10ML suspension Take 10 mLs (1 g total) by mouth 2 (two) times daily for 14 days.  02/07/24 03/16/24  Drusilla Sabas RAMAN, MD  SUMAtriptan  (IMITREX ) 50 MG tablet Take 50 mg by mouth every 2 (two) hours as needed for migraine or headache. 06/20/22   [provider]     Vitals:   03/22/24 0245 03/22/24 0315 03/22/24 0405 03/22/24 0408  BP: 111/73 128/88  117/87  Pulse: 81 79  78  Resp: 13 12  19   Temp:    97.9 F (36.6 C)  TempSrc:    Oral  SpO2: 99% 100% 100% 100%  Weight:      Height:       Exam Gen alert, no distress, Rico O2 Sclera anicteric, throat clear  No jvd or bruits Chest clear bilat to bases RRR no MRG Abd soft ascites 3+ not tight, just very big Ext trace LE edema, no other edema Neuro is alert, Ox 3 , nf    RUE AVG + bruit   Home bp meds: Coreg  25 bid Entresto  24-26 bid Bumex  10mg  daily    OP HD: [facility] TTS AVG  Heparin  1600 +600/hr  CXR 11/23: no pulm edema/ vasc congestion, poss RLL pna   Assessment/ Plan: ESRD: on HD TTS. HD off schedule today. HD tomorrow.   BP: bp's are low normal. Cont meds as tolerated.  Volume: most of her excess vol is in the abd cavity (ascites). CXR clear of edema. Will follow.  Anemia of esrd: Hb 11-13 here. Follow.  Abdominal pain: w/u in progress, hx of ascites T1DM    Myer Fret  MD [facility] 03/22/2024, 6:13 AM  Recent Labs  Lab 03/18/24 0706 03/21/24 2151 03/21/24 2200 03/21/24 2204  HGB  --  11.1* 12.9 13.3  ALBUMIN   --  3.1*  --   --   CALCIUM  8.5* 8.6*  --   --   CREATININE 6.34* 7.74*  --  7.50*  K 4.4 5.3* 5.3* 5.4*   Inpatient medications:  bumetanide  (BUMEX ) IV  1 mg Intravenous Q12H   DULoxetine   20 mg Oral Daily   fluticasone   2 spray Each Nare Daily   heparin   5,000 Units Subcutaneous Q8H   insulin  aspart  5 Units Subcutaneous TID WC   insulin  glargine-yfgn  10 Units Subcutaneous Daily   lamoTRIgine   200 mg Oral Daily   OLANZapine  zydis  5 mg Oral QHS   pantoprazole   40 mg Oral BID  sevelamer  carbonate  800 mg Oral TID WC   sodium bicarbonate   650 mg Oral BID     cefTRIAXone  (ROCEPHIN )  IV     acetaminophen  **OR** acetaminophen , albuterol , hydrOXYzine , morphine  injection, prochlorperazine 

## 2024-03-22 NOTE — Significant Event (Signed)
 Patient's nurse notified that patient was found unresponsive and had some secretions around the mouth.  CBG check was 13.  Patient was given D50 following which patient became more alert awake.  On exam at bedside patient is alert awake following commands..  Mildly hypoxic, ordered chest x-ray.  I discontinued patient's Lantus  and insulin  coverage.  Will check CBGs closely.  If remains hypoglycemic will start D10.  Redia Cleaver. MD.

## 2024-03-22 NOTE — Progress Notes (Signed)
 PROGRESS NOTE ICEL CASTLES  FMW:[id number] DOB: 09/13/1990 DOA: 03/21/2024 PCP: Keven Crumbly Pap, MD  Brief Narrative/Hospital Course: Ashley Freeman is a 33 y.o. female with PMH of ESRD on TTS HD, chronic HFrEF (EF 30-35%), T1DM, HTN, anemia of chronic renal disease, HLD, BPD, recent Right neck abscess s/p I&D 02/23/24 and recent admission for DKA 11/19 presented with missing dialysis because of not having a ride, her last dialysis was on Thursday and also feeling unwell, having ongoing cough, shortness of breath.  In the ED: VSS labs with leukocytosis beta SOB 0.6 hyperkalemia 5.3 gap 19 BNP >4500 CXR-airspace disease in the right lower lobe concerning for pneumonia.  Cardiomegaly. CT abdomen pelvis>>moderate to severe ascites, increased compared to prior exam.  Moderate right pleural effusion slightly increased compared to prior exam.  EKG demonstrated normal sinus rhythm with prolonged QT 501 Patient admitted for acute on chronic diastolic CHF possible CAP, moderate pleural effusion ascites  Patient seen and examined personally, I reviewed the chart, history and physical and admission note, done by admitting physician this morning and agree with the same with following addendum.  Please refer to the morning admission note for more detailed plan of care.  Subjective: Seen and examined today in dialysis Patient having cough and dry, on room air she appears chronically ill and sick Has abdominal distention with tightness Overnight afebrile, VSS  Assessment and plan:  Acute on chronic systolic CHF Fluid overload Moderate pleural effusion Large volume ascites-recurrent Missed dialysis: Patient with signs symptoms of fluid overload multifactorial due to acute on chronic systolic CHF, ESRD with missed HD. nephrology on board for dialysis.  Continue fluid and salt restriction diet. PTA on Coreg  25 twice daily, Bumex  10 daily, Entresto : Will resume slowly if BP  allows overnight BP 90s-120s Placed on IV Lasix  monitor intake and daily weight Follow-up paracentesis for ascites.  Reports he previously had multiple paracentesis monitor pleural effusion with intermittent chest x-ray-currently not hypoxic.  Suspected CAP: Possible pneumonia versus atelectasis due to pleural effusion.  Has mild leukocytosis continue ceftriaxone .  Patient has prolonged QTc recheck QTc before adding additional agent    ESRD on TTS HD Anemia of chronic renal disease: Nephrology has been consulted, patient missed dialysis on Thursday  T1DM w/ HLD: Recent admission for DKA.  Continue home Lantus  10 units, sliding scale.  Continue home Crestor   HTN: BP stable resume Coreg  Entresto  Bumex  GDMT as above  BPD Seizure disorder: Continue her Lamictal , Zyprexa  at bedtime.  DVT prophylaxis: heparin  injection 5,000 Units Start: 03/22/24 0600 Code Status:   Code Status: Full Code Family Communication: plan of care discussed with patient at bedside. Patient status is: Remains hospitalized because of severity of illness Level of care: Progressive   Dispo: The patient is from: home            Anticipated disposition: TBD.  Request PT OT after paracentesis Objective: Vitals last 24 hrs: Vitals:   03/22/24 0930 03/22/24 1000 03/22/24 1030 03/22/24 1100  BP: (!) 174/91 (!) 181/92 (!) 191/99 (!) 189/97  Pulse: 85 88 (!) 102 86  Resp: 16 15 15 17   Temp:      TempSrc:      SpO2: 100% 100% 100% 100%  Weight:      Height:        Physical Examination: General exam: alert awake, frail, chronically sick looking HEENT:Oral mucosa moist, Ear/Nose WNL grossly Respiratory system: Bilaterally clear BS,no use of accessory muscle Cardiovascular system: S1 & S2 +, No  JVD. Gastrointestinal system: Abdomen soft,NT,ND, BS+ Nervous System: Alert, awake, moving all extremities,and following commands. Extremities: extremities warm, leg edema neg Skin: Warm, no rashes MSK: Normal muscle  bulk,tone, power   Medications reviewed:  Scheduled Meds:  bumetanide  (BUMEX ) IV  1 mg Intravenous Q12H   Chlorhexidine  Gluconate Cloth  6 each Topical Q0600   DULoxetine   20 mg Oral Daily   fluticasone   2 spray Each Nare Daily   heparin   5,000 Units Subcutaneous Q8H   insulin  aspart  5 Units Subcutaneous TID WC   insulin  glargine-yfgn  10 Units Subcutaneous Daily   lamoTRIgine   200 mg Oral Daily   OLANZapine  zydis  5 mg Oral QHS   pantoprazole   40 mg Oral BID   sevelamer  carbonate  800 mg Oral TID WC   sodium bicarbonate   650 mg Oral BID   Continuous Infusions:  anticoagulant sodium citrate      cefTRIAXone  (ROCEPHIN )  IV     Diet: Diet Order             Diet renal/carb modified with fluid restriction Diet-HS Snack? Nothing; Fluid restriction: 1200 mL Fluid; Room service appropriate? Yes; Fluid consistency: Thin  Diet effective now                    Data Reviewed: I have personally reviewed following labs and imaging studies ( see epic result tab) CBC: Recent Labs  Lab 03/16/24 1608 03/16/24 1622 03/17/24 0746 03/21/24 2151 03/21/24 2200 03/21/24 2204  WBC 8.1  --  16.1* 13.4*  --   --   NEUTROABS 4.2  --   --   --   --   --   HGB 10.0* 11.9*  12.2 9.6* 11.1* 12.9 13.3  HCT 34.3* 35.0*  36.0 29.8* 37.3 38.0 39.0  MCV 94.5  --  85.6 89.9  --   --   PLT 265  --  291 272  --   --    CMP: Recent Labs  Lab 03/17/24 0130 03/17/24 1031 03/17/24 1240 03/18/24 0706 03/21/24 2151 03/21/24 2200 03/21/24 2204  NA 132* 134* 136 136 134* 133* 134*  K 4.7 3.6 4.2 4.4 5.3* 5.3* 5.4*  CL 95* 94* 98 97* 94*  --  101  CO2 14* 20* 23 22 21*  --   --   GLUCOSE 479* 111* 72 127* 461*  --  438*  BUN 57* 26* 26* 28* 42*  --  42*  CREATININE 9.18* 4.55* 5.13* 6.34* 7.74*  --  7.50*  CALCIUM  8.8* 8.7* 8.5* 8.5* 8.6*  --   --    GFR: Estimated Creatinine Clearance: 10.1 mL/min (A) (by C-G formula based on SCr of 7.5 mg/dL (H)). Recent Labs  Lab 03/21/24 2151  AST 93*   ALT 31  ALKPHOS 745*  BILITOT 1.2  PROT 5.9*  ALBUMIN  3.1*   No results for input(s): LIPASE, AMYLASE in the last 168 hours. No results for input(s): AMMONIA in the last 168 hours. Coagulation Profile: No results for input(s): INR, PROTIME in the last 168 hours. Unresulted Labs (From admission, onward)     Start     Ordered   03/21/24 1810  Urinalysis, Routine w reflex microscopic -Urine, Clean Catch  Once,   URGENT       Question Answer Comment  Obtain urine by in and out catheter if not obtained within 30 minutes of placing in a treatment room? Yes   Specimen Source Urine, Clean Catch  03/21/24 1809           Antimicrobials/Microbiology: Anti-infectives (From admission, onward)    Start     Dose/Rate Route Frequency Ordered Stop   03/22/24 1000  cefTRIAXone  (ROCEPHIN ) 2 g in sodium chloride  0.9 % 100 mL IVPB        2 g 200 mL/hr over 30 Minutes Intravenous Every 24 hours 03/22/24 0317     03/21/24 2315  cefTRIAXone  (ROCEPHIN ) 1 g in sodium chloride  0.9 % 100 mL IVPB        1 g 200 mL/hr over 30 Minutes Intravenous  Once 03/21/24 2304 03/22/24 0114   03/21/24 2315  azithromycin  (ZITHROMAX ) 500 mg in sodium chloride  0.9 % 250 mL IVPB        500 mg 250 mL/hr over 60 Minutes Intravenous  Once 03/21/24 2304 03/22/24 0224         Component Value Date/Time   SDES BLOOD LEFT HAND 03/08/2024 0728   SPECREQUEST  03/08/2024 0728    BOTTLES DRAWN AEROBIC ONLY Blood Culture results may not be optimal due to an inadequate volume of blood received in culture bottles   CULT  03/08/2024 0728    NO GROWTH 5 DAYS Performed at [facility], [address], [place], [place] [address]    REPTSTATUS 03/13/2024 FINAL 03/08/2024 9271   Procedures:  Mennie LAMY, MD [facility] 03/22/2024, 11:19 AM

## 2024-03-23 ENCOUNTER — Inpatient Hospital Stay (HOSPITAL_COMMUNITY): Admission: RE | Admit: 2024-03-23

## 2024-03-23 ENCOUNTER — Encounter (HOSPITAL_COMMUNITY): Payer: Self-pay

## 2024-03-23 ENCOUNTER — Inpatient Hospital Stay (HOSPITAL_COMMUNITY)

## 2024-03-23 DIAGNOSIS — I5033 Acute on chronic diastolic (congestive) heart failure: Secondary | ICD-10-CM | POA: Diagnosis not present

## 2024-03-23 HISTORY — PX: IR PARACENTESIS: IMG2679

## 2024-03-23 LAB — BODY FLUID CELL COUNT WITH DIFFERENTIAL
Eos, Fluid: 0 %
Lymphs, Fluid: 35 %
Monocyte-Macrophage-Serous Fluid: 61 % (ref 50–90)
Neutrophil Count, Fluid: 4 % (ref 0–25)
Total Nucleated Cell Count, Fluid: 198 uL (ref 0–1000)

## 2024-03-23 LAB — BASIC METABOLIC PANEL WITH GFR
Anion gap: 17 — ABNORMAL HIGH (ref 5–15)
BUN: 26 mg/dL — ABNORMAL HIGH (ref 6–20)
CO2: 22 mmol/L (ref 22–32)
Calcium: 8.1 mg/dL — ABNORMAL LOW (ref 8.9–10.3)
Chloride: 92 mmol/L — ABNORMAL LOW (ref 98–111)
Creatinine, Ser: 5.41 mg/dL — ABNORMAL HIGH (ref 0.44–1.00)
GFR, Estimated: 10 mL/min — ABNORMAL LOW (ref >60)
Glucose, Bld: 114 mg/dL — ABNORMAL HIGH (ref 70–99)
Potassium: 3.8 mmol/L (ref 3.5–5.1)
Sodium: 131 mmol/L — ABNORMAL LOW (ref 135–145)

## 2024-03-23 LAB — CBC WITH DIFFERENTIAL/PLATELET
Abs Immature Granulocytes: 0.06 K/uL (ref 0.00–0.07)
Basophils Absolute: 0.1 K/uL (ref 0.0–0.1)
Basophils Relative: 1 %
Eosinophils Absolute: 1 K/uL — ABNORMAL HIGH (ref 0.0–0.5)
Eosinophils Relative: 9 %
HCT: 39.4 % (ref 36.0–46.0)
Hemoglobin: 12.2 g/dL (ref 12.0–15.0)
Immature Granulocytes: 1 %
Lymphocytes Relative: 39 %
Lymphs Abs: 4.8 K/uL — ABNORMAL HIGH (ref 0.7–4.0)
MCH: 27.1 pg (ref 26.0–34.0)
MCHC: 31 g/dL (ref 30.0–36.0)
MCV: 87.6 fL (ref 80.0–100.0)
Monocytes Absolute: 1.2 K/uL — ABNORMAL HIGH (ref 0.1–1.0)
Monocytes Relative: 11 %
Neutro Abs: 4.5 K/uL (ref 1.7–7.7)
Neutrophils Relative %: 39 %
Platelets: 263 K/uL (ref 150–400)
RBC: 4.5 MIL/uL (ref 3.87–5.11)
RDW: 16.4 % — ABNORMAL HIGH (ref 11.5–15.5)
Smear Review: NORMAL
WBC: 11.7 K/uL — ABNORMAL HIGH (ref 4.0–10.5)
nRBC: 0.6 % — ABNORMAL HIGH (ref 0.0–0.2)

## 2024-03-23 LAB — LACTATE DEHYDROGENASE, PLEURAL OR PERITONEAL FLUID: LD, Fluid: 95 U/L — ABNORMAL HIGH (ref 3–23)

## 2024-03-23 LAB — GLUCOSE, PLEURAL OR PERITONEAL FLUID: Glucose, Fluid: 149 mg/dL

## 2024-03-23 LAB — GLUCOSE, CAPILLARY
Glucose-Capillary: 116 mg/dL — ABNORMAL HIGH (ref 70–99)
Glucose-Capillary: 119 mg/dL — ABNORMAL HIGH (ref 70–99)
Glucose-Capillary: 129 mg/dL — ABNORMAL HIGH (ref 70–99)
Glucose-Capillary: 130 mg/dL — ABNORMAL HIGH (ref 70–99)
Glucose-Capillary: 138 mg/dL — ABNORMAL HIGH (ref 70–99)
Glucose-Capillary: 145 mg/dL — ABNORMAL HIGH (ref 70–99)
Glucose-Capillary: 146 mg/dL — ABNORMAL HIGH (ref 70–99)
Glucose-Capillary: 169 mg/dL — ABNORMAL HIGH (ref 70–99)
Glucose-Capillary: 269 mg/dL — ABNORMAL HIGH (ref 70–99)
Glucose-Capillary: 305 mg/dL — ABNORMAL HIGH (ref 70–99)
Glucose-Capillary: 56 mg/dL — ABNORMAL LOW (ref 70–99)

## 2024-03-23 LAB — ALBUMIN, PLEURAL OR PERITONEAL FLUID: Albumin, Fluid: <1.5 g/dL

## 2024-03-23 LAB — HEPATITIS B SURFACE ANTIGEN: Hepatitis B Surface Ag: NONREACTIVE

## 2024-03-23 LAB — PROCALCITONIN: Procalcitonin: 1.3 ng/mL

## 2024-03-23 LAB — PROTEIN, PLEURAL OR PERITONEAL FLUID: Total protein, fluid: <3 g/dL

## 2024-03-23 LAB — MRSA NEXT GEN BY PCR, NASAL: MRSA by PCR Next Gen: NOT DETECTED

## 2024-03-23 MED ORDER — INSULIN ASPART 100 UNIT/ML IJ SOLN
0.0000 [IU] | Freq: Three times a day (TID) | INTRAMUSCULAR | Status: DC
  Administered 2024-03-24: 4 [IU] via SUBCUTANEOUS
  Filled 2024-03-23: qty 1

## 2024-03-23 MED ORDER — DEXTROSE 50 % IV SOLN: 12.5000 g | INTRAVENOUS | Status: AC

## 2024-03-23 MED ORDER — HEPARIN SODIUM (PORCINE) 1000 UNIT/ML DIALYSIS
2500.0000 [IU] | Freq: Once | INTRAMUSCULAR | Status: AC
  Administered 2024-03-24: 2500 [IU] via INTRAVENOUS_CENTRAL

## 2024-03-23 MED ORDER — VANCOMYCIN HCL 1500 MG/300ML IV SOLN
1500.0000 mg | Freq: Once | INTRAVENOUS | Status: AC
  Administered 2024-03-23: 1500 mg via INTRAVENOUS
  Filled 2024-03-23: qty 300

## 2024-03-23 MED ORDER — HEPARIN SODIUM (PORCINE) 1000 UNIT/ML DIALYSIS: 1000.0000 [IU] | INTRAMUSCULAR | Status: DC | PRN

## 2024-03-23 MED ORDER — LIDOCAINE-EPINEPHRINE 1 %-1:100000 IJ SOLN
INTRAMUSCULAR | Status: AC
Start: 2024-03-23 — End: 2024-03-23
  Filled 2024-03-23: qty 1

## 2024-03-23 MED ORDER — ANTICOAGULANT SODIUM CITRATE 4% (200MG/5ML) IV SOLN: 5.0000 mL | Status: DC | PRN

## 2024-03-23 MED ORDER — PENTAFLUOROPROP-TETRAFLUOROETH EX AERO
1.0000 | INHALATION_SPRAY | CUTANEOUS | Status: DC | PRN
Start: 2024-03-23 — End: 2024-03-24

## 2024-03-23 MED ORDER — VANCOMYCIN HCL 750 MG/150ML IV SOLN: 750.0000 mg | INTRAVENOUS | Status: DC

## 2024-03-23 MED ORDER — DEXTROSE 50 % IV SOLN
INTRAVENOUS | Status: AC
  Administered 2024-03-23: 12.5 g via INTRAVENOUS
  Filled 2024-03-23: qty 50

## 2024-03-23 MED ORDER — ALBUMIN HUMAN 25 % IV SOLN
12.5000 g | Freq: Once | INTRAVENOUS | Status: AC
  Administered 2024-03-23: 12.5 g via INTRAVENOUS
  Filled 2024-03-23: qty 50

## 2024-03-23 MED ORDER — LIDOCAINE-EPINEPHRINE 1 %-1:100000 IJ SOLN
20.0000 mL | Freq: Once | INTRAMUSCULAR | Status: AC
  Administered 2024-03-23: 10 mL

## 2024-03-23 MED ORDER — LIDOCAINE-PRILOCAINE 2.5-2.5 % EX CREA: 1.0000 | TOPICAL_CREAM | CUTANEOUS | Status: DC | PRN

## 2024-03-23 MED ORDER — NEPRO/CARBSTEADY PO LIQD: 237.0000 mL | ORAL | Status: DC | PRN

## 2024-03-23 MED ORDER — DEXTROSE 10 % IV SOLN: INTRAVENOUS | Status: DC

## 2024-03-23 MED ORDER — LIDOCAINE HCL (PF) 1 % IJ SOLN: 5.0000 mL | INTRAMUSCULAR | Status: DC | PRN

## 2024-03-23 MED ORDER — INSULIN ASPART 100 UNIT/ML IJ SOLN: 0.0000 [IU] | Freq: Three times a day (TID) | INTRAMUSCULAR | Status: DC

## 2024-03-23 MED ORDER — SODIUM CHLORIDE 0.9 % IV SOLN
1.0000 g | INTRAVENOUS | Status: DC
  Administered 2024-03-23 – 2024-03-24 (×2): 1 g via INTRAVENOUS
  Filled 2024-03-23 (×2): qty 10

## 2024-03-23 NOTE — Inpatient Diabetes Management (Signed)
 Inpatient Diabetes Program Recommendations  AACE/ADA: New Consensus Statement on Inpatient Glycemic Control (2015)  Target Ranges:  Prepandial:   less than 140 mg/dL      Peak postprandial:   less than 180 mg/dL (1-2 hours)      Critically ill patients:  140 - 180 mg/dL   Lab Results  Component Value Date   GLUCAP 145 (H) 03/23/2024   HGBA1C 10.8 (H) 12/20/2023    Review of Glycemic Control  Latest Reference Range & Units 03/22/24 21:06 03/22/24 22:04 03/22/24 22:26 03/22/24 23:03 03/23/24 00:14 03/23/24 00:37 03/23/24 02:25 03/23/24 04:16 03/23/24 06:08 03/23/24 11:15  Glucose-Capillary 70 - 99 mg/dL 863 (H) 42 (LL) 850 (H) 96 56 (L) 169 (H) 116 (H) 129 (H) 146 (H) 145 (H)  (LL): Data is critically low (H): Data is abnormally high (L): Data is abnormally low  Diabetes history: DM1 (does NOT make any insulin ; requires basal, carb coverage, and correction insulin ) Outpatient Diabetes medications: Lantus  10 units daily, Novolog  5 units TID with meals Current orders for Inpatient glycemic control: None  Inpatient Diabetes Program Recommendations:    Please consider: Semglee  5 units every day Novolog  0-6 units TID  Hypoglycemia yesterday after receiving Novolog  5 x 2 for elvated K+  Thank you, Ashley Pinal, MSN, CDCES Diabetes Coordinator Inpatient Diabetes Program [phone] (team pager from 8a-5p)

## 2024-03-23 NOTE — Progress Notes (Signed)
 Pharmacy Antibiotic Note  Ashley Freeman is a 33 y.o. female admitted on 03/21/2024 with shortness of breath/abdominal pain/missed HD.  Pharmacy has been consulted for cefepime /vancomycin  dosing.  -Ceftriaxone /azithromycin  x1 -CXR: worsening bilateral airspace disease; suspect small right pleural effusion -WBC 13, ESRD HD TTS, afebrile -Last MRSA PCR negative 12/2023; COVID/Flu negative   Plan: -Cefepime  1g IV every 24 hours -Vancomycin  1500mg  IV x1 -Vancomycin  750mg  IV after every HD session TTS -Order MRSA PCR -Follow up signs of clinical improvement, LOT, de-escalation of antibiotics   Height: 5' 3 (160 cm) Weight: 65 kg (143 lb 4.8 oz) IBW/kg (Calculated) : 52.4  Temp (24hrs), Avg:96.9 F (36.1 C), Min:93.9 F (34.4 C), Max:98.5 F (36.9 C)  Recent Labs  Lab 03/16/24 1608 03/16/24 1622 03/16/24 2230 03/17/24 0746 03/17/24 1031 03/17/24 1240 03/18/24 0706 03/21/24 2151 03/21/24 2204  WBC 8.1  --   --  16.1*  --   --   --  13.4*  --   CREATININE 8.83* 8.50*   < >  --  4.55* 5.13* 6.34* 7.74* 7.50*  LATICACIDVEN  --  1.6  --   --   --   --   --   --   --    < > = values in this interval not displayed.    Estimated Creatinine Clearance: 9.8 mL/min (A) (by C-G formula based on SCr of 7.5 mg/dL (H)).    Allergies  Allergen Reactions   Keflex  [Cephalexin ] Anaphylaxis    Ceftriaxone  in the past with no reaction   Penicillins Anaphylaxis, Hives and Rash   Vibramycin  [Doxycycline ] Anaphylaxis   Benadryl  [Diphenhydramine ] Itching   Dilaudid  [Hydromorphone ] Itching   Methotrexate And Trimetrexate Rash   Roxicodone  [Oxycodone ] Itching    Takes Percocet without issue    Antimicrobials this admission: Cefepime  11/24 >>  Vancomycin  11/24 >>   Microbiology results: 11/24 MRSA PCR:   Thank you for allowing pharmacy to be a part of this patient's care.  Lynwood Poplar, PharmD, BCPS Clinical Pharmacist 03/23/2024 1:42 AM

## 2024-03-23 NOTE — Plan of Care (Signed)
 Patient had low BP after returning from paracentesis, had fluid drained, and sampled, was found to have gram positive enterococci and WBCs present in peritoneal fluid. When given albumin , blood pressure came back up. Hemodialysis tomorrow.

## 2024-03-23 NOTE — Procedures (Signed)
 PROCEDURE SUMMARY:  Successful image-guided paracentesis from the right lower abdomen.  Yielded 6.2 liters of hazy yellow fluid.  No immediate complications.  EBL: trace  Patient tolerated well.   Specimen not sent for labs.  Please see imaging section of Epic for full dictation.  Kimble DEL Jalynne Persico PA-C 03/23/2024 1:24 PM

## 2024-03-23 NOTE — Progress Notes (Signed)
 PROGRESS NOTE [name]  FMW:[id number] DOB: 1990-05-03 DOA: 03/21/2024 PCP: Keven Crumbly Pap, MD  Brief Narrative/Hospital Course: Ashley Freeman is a 33 y.o. female with PMH of ESRD on TTS HD, chronic HFrEF (EF 30-35%), T1DM, HTN, anemia of chronic renal disease, HLD, BPD, recent Right neck abscess s/p I&D 02/23/24 and recent admission for DKA 11/19 presented with missing dialysis because of not having a ride, her last dialysis was on Thursday and also feeling unwell, having ongoing cough, shortness of breath.  In the ED: VSS labs with leukocytosis beta SOB 0.6 hyperkalemia 5.3 gap 19 BNP >4500 CXR-airspace disease in the right lower lobe concerning for pneumonia.  Cardiomegaly. CT abdomen pelvis>>moderate to severe ascites, increased compared to prior exam.  Moderate right pleural effusion slightly increased compared to prior exam.  EKG demonstrated normal sinus rhythm with prolonged QT 501 Patient admitted for acute on chronic diastolic CHF possible CAP, moderate pleural effusion ascites  Subjective: Seen and examined  Alert awake resting comfortably Denies nausea vomiting chest pain Overnight afebrile vital stable on 2 L [place]-yesterday evening blood pressure running high before resuming her home meds Labs showed stable creatinine and bicarb, WBC down to 11.7 Yesterday patient was briefly unresponsive with hypoglycemia  Assessment and plan:  Acute on chronic systolic CHF Fluid overload Moderate pleural effusion Large volume ascites-recurrent Missed dialysis: Patient with signs symptoms of fluid overload multifactorial due to acute on chronic systolic CHF, ESRD with missed HD. nephrology on board regarding dialysis 11/23.   Blood pressure stable after resuming home Coreg  and Entresto  Continue IV Bumex , HD as per nephrology monitor intake output Daily weight and restrict fluids and salt intake X-ray ordered this morning shows worsening bilateral  airspace disease-possibly reflecting edema or infection Awaiting ultrasound-guided paracentesis .Reports she previously had multiple paracentesis monitor pleural effusion with intermittent chest x-ray- on 2l [place]  Suspected CAP: Continue on empiric antibiotics .WBC downtrending   ESRD on TTS HD Anemia of chronic renal disease Metabolic bone disease: Nephrology following got HD 11/23 continue HD as per schedule , continue sodium bicarb sevelamer  and monitor hemoglobin  T1DM w/ HLD with uncontrolled hyperglycemia and hypoglycemia, appears brittle DM: Recent admission for DKA.  PTA on Lantus  10 units daily and NovoLog  FlexPen 5 units Premeal Blood sugar as low as 14 on 11/23 and briefly unresponsive 8 PM and remains on D10-hold off on insulin  regimen, consult DM coordinator.  Encourage p.o. and wean off IV fluids, keep on SSI only Recent Labs  Lab 03/23/24 0014 03/23/24 0037 03/23/24 0225 03/23/24 0416 03/23/24 0608  GLUCAP 56* 169* 116* 129* 146*    HTN: BP was poorly controlled and stabilized after resuming home Coreg  and Entresto  continue diuretics  BPD Seizure disorder: Continue her Lamictal , Zyprexa  at bedtime.  DVT prophylaxis: heparin  injection 5,000 Units Start: 03/22/24 0600 Code Status:   Code Status: Full Code Family Communication: plan of care discussed with patient at bedside. Patient status is: Remains hospitalized because of severity of illness Level of care: Progressive   Dispo: The patient is from: home            Anticipated disposition: TBD.  Request PT OT after paracentesis Objective: Vitals last 24 hrs: Vitals:   03/23/24 0400 03/23/24 0500 03/23/24 0724 03/23/24 0842  BP: (!) 144/78  (!) 141/82 131/83  Pulse:   69 74  Resp: 18  17   Temp: (!) 96.4 F (35.8 C)  (!) 97.5 F (36.4 C)   TempSrc: Axillary  Oral  SpO2: 93%  94%   Weight:  67.5 kg    Height:       Physical Examination: General exam: alert awake o x3 HEENT:Oral mucosa moist, Ear/Nose  WNL grossly Respiratory system: cta Bilaterally Cardiovascular system: S1 & S2 +, No JVD. Gastrointestinal system: Abdomen soft,NT,ND, BS+ Nervous System: Alert, awake, moving all extremities,and following commands. Extremities: extremities warm, leg edema neg, RUE AVG+ Skin: Warm, no rashes MSK: Normal muscle bulk,tone, power   Medications reviewed:  Scheduled Meds:  bumetanide  (BUMEX ) IV  1 mg Intravenous Q12H   carvedilol   25 mg Oral BID WC   Chlorhexidine  Gluconate Cloth  6 each Topical Q0600   Chlorhexidine  Gluconate Cloth  6 each Topical Q0600   DULoxetine   20 mg Oral Daily   fluticasone   2 spray Each Nare Daily   heparin   5,000 Units Subcutaneous Q8H   lamoTRIgine   200 mg Oral Daily   OLANZapine  zydis  5 mg Oral QHS   pantoprazole   40 mg Oral BID   sacubitril -valsartan   1 tablet Oral BID   sevelamer  carbonate  800 mg Oral TID WC   sodium bicarbonate   650 mg Oral BID   Continuous Infusions:  ceFEPime  (MAXIPIME ) IV 1 g (03/23/24 0219)   dextrose  75 mL/hr at 03/23/24 0033   [START ON 03/24/2024] vancomycin      Diet: Diet Order             Diet renal/carb modified with fluid restriction Diet-HS Snack? Nothing; Fluid restriction: 1200 mL Fluid; Room service appropriate? Yes; Fluid consistency: Thin  Diet effective now                    Data Reviewed: I have personally reviewed following labs and imaging studies ( see epic result tab) CBC: Recent Labs  Lab 03/16/24 1608 03/16/24 1622 03/17/24 0746 03/21/24 2151 03/21/24 2200 03/21/24 2204 03/23/24 0619  WBC 8.1  --  16.1* 13.4*  --   --  11.7*  NEUTROABS 4.2  --   --   --   --   --  4.5  HGB 10.0*   < > 9.6* 11.1* 12.9 13.3 12.2  HCT 34.3*   < > 29.8* 37.3 38.0 39.0 39.4  MCV 94.5  --  85.6 89.9  --   --  87.6  PLT 265  --  291 272  --   --  263   < > = values in this interval not displayed.   CMP: Recent Labs  Lab 03/17/24 1031 03/17/24 1240 03/18/24 0706 03/21/24 2151 03/21/24 2200  03/21/24 2204 03/23/24 0619  NA 134* 136 136 134* 133* 134* 131*  K 3.6 4.2 4.4 5.3* 5.3* 5.4* 3.8  CL 94* 98 97* 94*  --  101 92*  CO2 20* 23 22 21*  --   --  22  GLUCOSE 111* 72 127* 461*  --  438* 114*  BUN 26* 26* 28* 42*  --  42* 26*  CREATININE 4.55* 5.13* 6.34* 7.74*  --  7.50* 5.41*  CALCIUM  8.7* 8.5* 8.5* 8.6*  --   --  8.1*   GFR: Estimated Creatinine Clearance: 13.9 mL/min (A) (by C-G formula based on SCr of 5.41 mg/dL (H)). Recent Labs  Lab 03/21/24 2151  AST 93*  ALT 31  ALKPHOS 745*  BILITOT 1.2  PROT 5.9*  ALBUMIN  3.1*   No results for input(s): LIPASE, AMYLASE in the last 168 hours. No results for input(s): AMMONIA in the last 168 hours. Coagulation  Profile: No results for input(s): INR, PROTIME in the last 168 hours. Unresulted Labs (From admission, onward)     Start     Ordered   03/23/24 9065  Hepatitis B surface antigen  (New Admission Hemo Labs (Hepatitis B))  Once,   R        03/23/24 0934   03/23/24 0934  Hepatitis B surface antibody,quantitative  (New Admission Hemo Labs (Hepatitis B))  Once,   R        03/23/24 0934   03/21/24 1810  Urinalysis, Routine w reflex microscopic -Urine, Clean Catch  Once,   URGENT       Question Answer Comment  Obtain urine by in and out catheter if not obtained within 30 minutes of placing in a treatment room? Yes   Specimen Source Urine, Clean Catch      03/21/24 1809   Signed and Held  Renal function panel  Once,   R        Signed and Held   Signed and Held  CBC  Once,   R        Signed and Held           Antimicrobials/Microbiology: Anti-infectives (From admission, onward)    Start     Dose/Rate Route Frequency Ordered Stop   03/24/24 1200  vancomycin  (VANCOREADY) IVPB 750 mg/150 mL        750 mg 150 mL/hr over 60 Minutes Intravenous Every T-Th-Sa (Hemodialysis) 03/23/24 0143     03/23/24 0300  ceFEPIme  (MAXIPIME ) 1 g in sodium chloride  0.9 % 100 mL IVPB        1 g 200 mL/hr over 30 Minutes  Intravenous Every 24 hours 03/23/24 0132     03/23/24 0230  vancomycin  (VANCOREADY) IVPB 1500 mg/300 mL        1,500 mg 150 mL/hr over 120 Minutes Intravenous  Once 03/23/24 0143 03/23/24 0422   03/22/24 1000  cefTRIAXone  (ROCEPHIN ) 2 g in sodium chloride  0.9 % 100 mL IVPB  Status:  Discontinued        2 g 200 mL/hr over 30 Minutes Intravenous Every 24 hours 03/22/24 0317 03/23/24 0132   03/21/24 2315  cefTRIAXone  (ROCEPHIN ) 1 g in sodium chloride  0.9 % 100 mL IVPB        1 g 200 mL/hr over 30 Minutes Intravenous  Once 03/21/24 2304 03/22/24 0114   03/21/24 2315  azithromycin  (ZITHROMAX ) 500 mg in sodium chloride  0.9 % 250 mL IVPB        500 mg 250 mL/hr over 60 Minutes Intravenous  Once 03/21/24 2304 03/22/24 0224         Component Value Date/Time   SDES BLOOD LEFT HAND 03/08/2024 0728   SPECREQUEST  03/08/2024 0728    BOTTLES DRAWN AEROBIC ONLY Blood Culture results may not be optimal due to an inadequate volume of blood received in culture bottles   CULT  03/08/2024 0728    NO GROWTH 5 DAYS Performed at [facility], [address], [place], [place] [address]    REPTSTATUS 03/13/2024 FINAL 03/08/2024 9271   Procedures:  Mennie LAMY, MD [facility] 03/23/2024, 10:46 AM

## 2024-03-23 NOTE — Progress Notes (Signed)
 Patient has IR procedure and informed floor nurse that he does not want to do dialysis 2 days in a row.  Dr. Rayburn informed and says ok to run tomorrow.  Camellia Brasil, LPN  [facility]

## 2024-03-23 NOTE — Progress Notes (Signed)
 Pt receives out-pt HD at [facility] on TTS 11:00 am chair time. Clinic states pt did receive treatment at clinic on Thursday, Nov 20. Pt's clinic has a holiday schedule for this week. Since pt is TTS, pt's next out-pt HD treatment would be scheduled on Wednesday due to holiday schedule. Inquired of clinic staff if pt had mentioned to staff her interest in home therapy options for dialysis at home. Navigator was advised that pt's nephrologist does not feel that pt has adequate support at home in order to be considered appropriate for dialysis treatments at home. Will assist as needed.   Randine Mungo Dialysis Navigator  [phone]

## 2024-03-23 NOTE — Progress Notes (Signed)
 Patient ID: Ashley Freeman, female   DOB: 1990/09/19, 33 y.o.   MRN: [id number] S: Pt had an episode of unresponsiveness due to hypoglycemia.  Markedly improved this morning.  Plan for paracentesis today and she would like to go home afterwards. O:BP 131/83   Pulse 74   Temp (!) 97.5 F (36.4 C) (Oral)   Resp 17   Ht 5' 3 (1.6 m)   Wt 67.5 kg   SpO2 94%   BMI 26.36 kg/m   Intake/Output Summary (Last 24 hours) at 03/23/2024 0926 Last data filed at 03/22/2024 1303 Gross per 24 hour  Intake 160 ml  Output 4000 ml  Net -3840 ml   Intake/Output: I/O last 3 completed shifts: In: 534.4 [P.O.:60; IV Piggyback:474.4] Out: 4000 [Other:4000]  Intake/Output this shift:  No intake/output data recorded. Weight change: 0 kg Gen: NAD CVS: RRR Resp:CTA Abd: distended, nontender, + fluid wave Ext: trace presacral edema  Recent Labs  Lab 03/16/24 2230 03/17/24 0130 03/17/24 1031 03/17/24 1240 03/18/24 0706 03/21/24 2151 03/21/24 2200 03/21/24 2204 03/23/24 0619  NA 128* 132* 134* 136 136 134* 133* 134* 131*  K 4.9 4.7 3.6 4.2 4.4 5.3* 5.3* 5.4* 3.8  CL 94* 95* 94* 98 97* 94*  --  101 92*  CO2 15* 14* 20* 23 22 21*  --   --  22  GLUCOSE 805* 479* 111* 72 127* 461*  --  438* 114*  BUN 58* 57* 26* 26* 28* 42*  --  42* 26*  CREATININE 8.85* 9.18* 4.55* 5.13* 6.34* 7.74*  --  7.50* 5.41*  ALBUMIN   --   --   --   --   --  3.1*  --   --   --   CALCIUM  8.1* 8.8* 8.7* 8.5* 8.5* 8.6*  --   --  8.1*  AST  --   --   --   --   --  93*  --   --   --   ALT  --   --   --   --   --  31  --   --   --    Liver Function Tests: Recent Labs  Lab 03/21/24 2151  AST 93*  ALT 31  ALKPHOS 745*  BILITOT 1.2  PROT 5.9*  ALBUMIN  3.1*   No results for input(s): LIPASE, AMYLASE in the last 168 hours. No results for input(s): AMMONIA in the last 168 hours. CBC: Recent Labs  Lab 03/16/24 1608 03/16/24 1622 03/17/24 0746 03/21/24 2151 03/21/24 2200 03/21/24 2204  03/23/24 0619  WBC 8.1  --  16.1* 13.4*  --   --  11.7*  NEUTROABS 4.2  --   --   --   --   --  4.5  HGB 10.0*   < > 9.6* 11.1* 12.9 13.3 12.2  HCT 34.3*   < > 29.8* 37.3 38.0 39.0 39.4  MCV 94.5  --  85.6 89.9  --   --  87.6  PLT 265  --  291 272  --   --  263   < > = values in this interval not displayed.   Cardiac Enzymes: No results for input(s): CKTOTAL, CKMB, CKMBINDEX, TROPONINI in the last 168 hours. CBG: Recent Labs  Lab 03/23/24 0014 03/23/24 0037 03/23/24 0225 03/23/24 0416 03/23/24 0608  GLUCAP 56* 169* 116* 129* 146*    Iron  Studies: No results for input(s): IRON , TIBC, TRANSFERRIN, FERRITIN in the last 72 hours. Studies/Results: DG Chest [employer]  1 View Result Date: 03/23/2024 EXAM: 1 VIEW(S) XRAY OF THE CHEST 03/23/2024 12:30:00 AM COMPARISON: 03/21/2024. CLINICAL HISTORY: SOB (shortness of breath). FINDINGS: LUNGS AND PLEURA: Worsening bilaterally airspace disease could reflect edema or infection. Suspect small right pleural effusion. Low lung volumes. No pneumothorax. HEART AND MEDIASTINUM: Cardiomegaly. BONES AND SOFT TISSUES: No acute osseous abnormality. IMPRESSION: 1. Worsening bilateral airspace disease, possibly reflecting edema or infection. 2. Suspected small right pleural effusion. 3. Cardiomegaly. Electronically signed by: Franky Crease MD 03/23/2024 12:42 AM EST RP Workstation: [id number]   CT ABDOMEN PELVIS WO CONTRAST Result Date: 03/22/2024 EXAM: CT ABDOMEN AND PELVIS WITHOUT CONTRAST 03/22/2024 01:57:31 AM TECHNIQUE: CT of the abdomen and pelvis was performed without the administration of intravenous contrast. Multiplanar reformatted images are provided for review. Automated exposure control, iterative reconstruction, and/or weight-based adjustment of the mA/kV was utilized to reduce the radiation dose to as low as reasonably achievable. COMPARISON: 03/08/2024 CLINICAL HISTORY: Distended abdomen. FINDINGS: LOWER CHEST: Moderate right-sided  pleural effusion is noted, slightly increased when compared with the prior exam. LIVER: The liver is within normal limits. GALLBLADDER AND BILE DUCTS: The gallbladder has been surgically removed. No biliary ductal dilatation. SPLEEN: The spleen is unremarkable. PANCREAS: The pancreas is unremarkable. ADRENAL GLANDS: The adrenal glands are well visualized without acute abnormality. KIDNEYS, URETERS AND BLADDER: The kidneys are well visualized without acute abnormality. Renal vascular calcifications are seen. The bladder is partially distended. No stones in the kidneys or ureters. GI AND BOWEL: Stomach and small bowel are within normal limits. No obstructive or inflammatory changes of the colon are seen. The appendix is not well visualized and may have been surgically removed. No inflammatory changes to suggest appendicitis are seen. PERITONEUM AND RETROPERITONEUM: Moderate to severe ascites is noted, increased when compared with the prior exam. No free air. VASCULATURE: Diffuse vascular calcifications are noted in the visceral vessels. The aorta shows no aneurysmal dilatation. LYMPH NODES: No lymphadenopathy. REPRODUCTIVE ORGANS: The uterus and adnexa are within normal limits. BONES AND SOFT TISSUES: Changes of anasarca are noted in the subcutaneous tissues. IMPRESSION: 1. Moderate to severe ascites, increased compared to the prior exam. 2. Moderate right pleural effusion, slightly increased compared to the prior exam. Electronically signed by: Oneil Devonshire MD 03/22/2024 02:03 AM EST RP Workstation: [id number]   DG Chest Portable 1 View Result Date: 03/21/2024 EXAM: 1 VIEW(S) XRAY OF THE CHEST 03/21/2024 07:26:00 PM COMPARISON: 03/16/2024 CLINICAL HISTORY: Shortness of breath FINDINGS: LUNGS AND PLEURA: Low lung volumes. Airspace disease in the right lower lobe concerning for pneumonia. No pleural effusion. No pneumothorax. HEART AND MEDIASTINUM: Cardiomegaly. BONES AND SOFT TISSUES: No acute osseous abnormality.  IMPRESSION: 1. Airspace disease in the right lower lobe concerning for pneumonia. 2. Cardiomegaly. Electronically signed by: Kevin Dover MD 03/21/2024 07:33 PM EST RP Workstation: [id number]    bumetanide  (BUMEX ) IV  1 mg Intravenous Q12H   carvedilol   25 mg Oral BID WC   Chlorhexidine  Gluconate Cloth  6 each Topical Q0600   Chlorhexidine  Gluconate Cloth  6 each Topical Q0600   DULoxetine   20 mg Oral Daily   fluticasone   2 spray Each Nare Daily   heparin   5,000 Units Subcutaneous Q8H   lamoTRIgine   200 mg Oral Daily   OLANZapine  zydis  5 mg Oral QHS   pantoprazole   40 mg Oral BID   sacubitril -valsartan   1 tablet Oral BID   sevelamer  carbonate  800 mg Oral TID WC   sodium bicarbonate   650 mg Oral BID  BMET    Component Value Date/Time   NA 131 (L) 03/23/2024 0619   NA 136 07/13/2021 1553   NA 137 11/12/2013 0423   K 3.8 03/23/2024 0619   K 4.2 11/12/2013 0423   CL 92 (L) 03/23/2024 0619   CL 107 11/12/2013 0423   CO2 22 03/23/2024 0619   CO2 23 11/12/2013 0423   GLUCOSE 114 (H) 03/23/2024 0619   GLUCOSE 339 (H) 11/12/2013 0423   BUN 26 (H) 03/23/2024 0619   BUN 51 (H) 07/13/2021 1553   BUN 7 11/12/2013 0423   CREATININE 5.41 (H) 03/23/2024 0619   CREATININE 0.59 (L) 11/12/2013 0423   CREATININE 0.51 08/24/2013 0957   CALCIUM  8.1 (L) 03/23/2024 0619   CALCIUM  8.2 (L) 11/12/2013 0423   GFRNONAA 10 (L) 03/23/2024 0619   GFRNONAA >60 11/12/2013 0423   GFRNONAA >89 08/24/2013 0957   GFRAA 44 (L) 01/30/2020 1637   GFRAA >60 11/12/2013 0423   GFRAA >89 08/24/2013 0957   CBC    Component Value Date/Time   WBC 11.7 (H) 03/23/2024 0619   RBC 4.50 03/23/2024 0619   HGB 12.2 03/23/2024 0619   HGB 10.0 (L) 09/08/2019 0940   HCT 39.4 03/23/2024 0619   HCT 30.3 (L) 09/08/2019 0940   PLT 263 03/23/2024 0619   PLT 302 09/08/2019 0940   MCV 87.6 03/23/2024 0619   MCV 87 09/08/2019 0940   MCV 80 11/11/2013 0431   MCH 27.1 03/23/2024 0619   MCHC 31.0 03/23/2024 0619   RDW  16.4 (H) 03/23/2024 0619   RDW 13.5 09/08/2019 0940   RDW 14.8 (H) 11/11/2013 0431   LYMPHSABS 4.8 (H) 03/23/2024 0619   LYMPHSABS 2.0 05/20/2019 1232   LYMPHSABS 3.7 (H) 11/11/2013 0431   MONOABS 1.2 (H) 03/23/2024 0619   MONOABS 0.5 11/11/2013 0431   EOSABS 1.0 (H) 03/23/2024 0619   EOSABS 0.4 05/20/2019 1232   EOSABS 0.1 11/11/2013 0431   BASOSABS 0.1 03/23/2024 0619   BASOSABS 0.0 05/20/2019 1232   BASOSABS 0.1 11/11/2013 0431      OP HD: [facility] TTS AVG  Heparin  1600 +600/hr   CXR 11/23: no pulm edema/ vasc congestion, poss RLL pna     Assessment/ Plan: ESRD: on HD TTS. HD off schedule and had HD yesterday. HD tomorrow either here or at her outpatient clinic.   Acute on chronic systolic CHF - moderate to severe ascites.  Plan for paracentesis today by IR. Possible CAP - per primary svc BP: bp's are low normal. Cont meds as tolerated.  Ascites: for US  guided paracentesis today by IR.  CXR clear of edema. Will follow.  Anemia of esrd: Hb 11-13 here. Follow.  Abdominal pain: w/u in progress, hx of ascites T1DM - had hypoglycemic event last night but better this morning. Disposition - possible discharge to home today after paracentesis.  Fairy RONAL Sellar, MD [facility]

## 2024-03-23 NOTE — Progress Notes (Signed)
 Heart Failure Navigator Progress Note  Assessed for Heart & Vascular TOC clinic readiness.  Patient does not meet criteria due to she is seen by [facility]. No HF TOC. .   Navigator will sign off at this time.   Stephane Haddock, BSN, Scientist, Clinical (histocompatibility And Immunogenetics) Only

## 2024-03-23 NOTE — TOC Initial Note (Signed)
 Transition of Care [facility]) - Initial/Assessment Note    Patient Details  Name: Ashley Freeman MRN: [id number] Date of Birth: Sep 02, 1990  Transition of Care [facility]) CM/SW Contact:    Waddell Barnie Rama, RN Phone Number: 03/23/2024, 2:22 PM  Clinical Narrative:                 From home with Mom,  has PCP and insurance on file, states has no HH services in place at this time or DME at home.  States family member  (mom) will transport them home at [employer] and family is support system, states gets medications from [facility] at [place].  Pta self ambulatory.  Patient states she wants to have a HHRN to help her do HD at home.  NCM informed her that do not think HHRN will be able to do that for her.  This NCM asked why she is not going to HD center, she states she does not want to go to HD center, NCM informed her that she will probably have to go because a [facility] RN will not be able to be with her for 3 hrs for HD unless she hires someone privately with HD skills she gets HD on [facility],  [facility].  Expected Discharge Plan: Home/Self Care Barriers to Discharge: Continued Medical Work up  Patient Goals and CMS Choice Patient states their goals for this hospitalization and ongoing recovery are:: return home   Choice offered to / list presented to : NA      Expected Discharge Plan and Services In-house Referral: NA Discharge Planning Services: CM Consult Post Acute Care Choice: NA Living arrangements for the past 2 months: Single Family Home                 DME Arranged: N/A DME Agency: NA       HH Arranged: NA          Prior Living Arrangements/Services Living arrangements for the past 2 months: Single Family Home Lives with:: Parents Patient language and need for interpreter reviewed:: Yes Do you feel safe going back to the place where you live?: Yes      Need for Family Participation in Patient Care: Yes (Comment) Care giver support system in  place?: Yes (comment)   Criminal Activity/Legal Involvement Pertinent to Current Situation/Hospitalization: No - Comment as needed  Activities of Daily Living   ADL Screening (condition at time of admission) Independently performs ADLs?: Yes (appropriate for developmental age) Is the patient deaf or have difficulty hearing?: No Does the patient have difficulty seeing, even when wearing glasses/contacts?: No Does the patient have difficulty concentrating, remembering, or making decisions?: No  Permission Sought/Granted Permission sought to share information with : Case Manager Permission granted to share information with : Yes, Verbal Permission Granted              Emotional Assessment Appearance:: Appears stated age Attitude/Demeanor/Rapport: Engaged Affect (typically observed): Appropriate Orientation: : Oriented to Self, Oriented to Place, Oriented to  Time, Oriented to Situation Alcohol  / Substance Use: Not Applicable Psych Involvement: No (comment)  Admission diagnosis:  Acute on chronic diastolic heart failure (HCC) [I50.33] Hyperglycemia [R73.9] Non-compliance [Z91.199] ESRD on dialysis (HCC) [N18.6, Z99.2] Community acquired pneumonia of right lower lobe of lung [J18.9] Patient Active Problem List   Diagnosis Date Noted   Acute on chronic diastolic heart failure (HCC) 03/22/2024   CAP (community acquired pneumonia) 03/22/2024   Diabetic ketoacidosis associated with type 1  diabetes mellitus (HCC) 03/16/2024   Neck abscess 02/22/2024   Sore throat 02/22/2024   HFrEF (heart failure with reduced ejection fraction) (HCC)    Chest pain 02/03/2024   Odynophagia 01/31/2024   Esophageal dysphagia 01/31/2024   Erosive esophagitis 01/31/2024   Hematemesis 01/26/2024   High anion gap metabolic acidosis 01/16/2024   Allergic rhinitis 01/16/2024   History of anemia due to chronic kidney disease 01/16/2024   Hypervolemia associated with renal insufficiency 12/19/2023   Pain  and swelling of right upper extremity 12/19/2023   History of seizure disorder 11/17/2023   Hyperosmolar hyperglycemic state (HHS) (HCC) 11/03/2023   Volume overload 11/03/2023   Vulvar pain 11/03/2023   Generalized abdominal pain 08/27/2023   Elevated LFTs 07/20/2023   Cholecystitis 06/30/2023   Prolonged QT interval 06/30/2023   Bipolar disorder (HCC) 06/30/2023   Hyperglycemic crisis due to Type 1 diabetes mellitus (HCC) 05/20/2023   Acute on chronic HFrEF (heart failure with reduced ejection fraction) (HCC) 05/20/2023   Chronic combined systolic and diastolic heart failure (HCC) 05/20/2023   Generalized pain 05/20/2023   Hypoglycemia 09/08/2022   Dilated cardiomyopathy (HCC) 09/08/2022   Seizures (HCC) 09/08/2022   ESRD on hemodialysis (HCC) 09/08/2022   Altered mental status 09/07/2022   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 difficile diarrhea 11/15/2021   SIRS (systemic inflammatory response syndrome) (HCC) 11/06/2021   LGSIL on Pap smear of cervix 09/24/2021   DKA (diabetic ketoacidosis) (HCC) 07/26/2021   Hypertension associated with diabetes (HCC) 07/26/2021   N&V (nausea and vomiting) 06/20/2021   Type 1 diabetes mellitus with chronic kidney disease on chronic dialysis (HCC) 05/26/2021   Dyslipidemia 05/26/2021   Anasarca 04/16/2021   Mild protein malnutrition 03/07/2021   DKA, type 1 (HCC) 12/08/2020   Diabetes mellitus type I (HCC) 02/08/2020   Leg edema, left 09/27/2019   Systolic ejection murmur 08/03/2019   Type 1 diabetes mellitus with hyperglycemia (HCC) 07/24/2019   Diabetic retinopathy (HCC) 07/02/2019   DM (diabetes mellitus), type 1 with renal complications (HCC) 05/21/2019   Diarrhea 05/31/2016   Diabetic neuropathy (HCC) 01/16/2016   Preop  cardiovascular exam 08/24/2013   Pseudohyponatremia 08/04/2012   Hypokalemia 05/07/2012   Lactic acidosis 02/19/2012   Leukocytosis 02/19/2012   Normocytic anemia 02/19/2012   Hyperkalemia 02/19/2012   Hyperglycemia 11/06/2011   Stable proliferative diabetic retinopathy associated with type 1 diabetes mellitus (HCC) 10/13/1997   PCP:  Keven Crumbly Pap, MD Pharmacy:   [facility] - [place] - [address] [place] ([place]) [place] [address] Phone: [phone] Fax: [phone]  [facility] [address] [place] [address] Phone: [phone] Fax: [phone]     Social Drivers of Health (SDOH) Social History: SDOH Screenings   Food Insecurity: No Food Insecurity (03/22/2024)  Housing: Low Risk  (03/22/2024)  Transportation Needs: No Transportation Needs (03/22/2024)  Utilities: Not At Risk (03/22/2024)  Depression (PHQ2-9): Medium Risk (03/02/2020)  Financial Resource Strain: Medium Risk (11/15/2023)   [facility]  Physical Activity: Insufficiently Active (03/02/2022)   Received from [facility]  Social Connections: Moderately Integrated (02/04/2024)  Stress: No Stress Concern Present (03/08/2023)   Received from [facility]  Tobacco Use: Low Risk  (03/16/2024)   SDOH Interventions:  Readmission Risk Interventions    03/23/2024    2:19 PM 02/06/2024    2:01 PM 11/28/2023   11:49 AM  Readmission Risk Prevention Plan  Transportation Screening Complete Complete Complete  Medication Review (RN Care Manager) Complete Referral to Pharmacy Complete  PCP or Specialist appointment within 3-5 days of discharge Complete Complete Complete  HRI or Home Care Consult Complete Complete Complete  SW Recovery Care/Counseling Consult  Complete   Palliative Care Screening Not Applicable Not Applicable Not Applicable  Skilled Nursing Facility Not Applicable Not Applicable  Not Applicable

## 2024-03-23 NOTE — Progress Notes (Signed)
 Pharmacy Antibiotic Note  Ashley Freeman is a 33 y.o. female admitted on 03/21/2024 with shortness of breath/abdominal pain/missed HD.  Concern for SBP. Pharmacy has been consulted for cefepime /vancomycin  dosing.  Plan: -Continue cefepime  1g IV every 24 hours -Vancomycin  750mg  IV after every HD session TTS - plan for iHD on Tuesday.  -Follow up signs of clinical improvement, LOT, de-escalation of antibiotics   Height: 5' 3 (160 cm) Weight: 67.5 kg (148 lb 13 oz) IBW/kg (Calculated) : 52.4  Temp (24hrs), Avg:96.7 F (35.9 C), Min:93.9 F (34.4 C), Max:98.5 F (36.9 C)  Recent Labs  Lab 03/16/24 1608 03/16/24 1622 03/16/24 2230 03/17/24 0746 03/17/24 1031 03/17/24 1240 03/18/24 0706 03/21/24 2151 03/21/24 2204 03/23/24 0619  WBC 8.1  --   --  16.1*  --   --   --  13.4*  --  11.7*  CREATININE 8.83* 8.50*   < >  --    < > 5.13* 6.34* 7.74* 7.50* 5.41*  LATICACIDVEN  --  1.6  --   --   --   --   --   --   --   --    < > = values in this interval not displayed.    Estimated Creatinine Clearance: 13.9 mL/min (A) (by C-G formula based on SCr of 5.41 mg/dL (H)).    Allergies  Allergen Reactions   Keflex  [Cephalexin ] Anaphylaxis    Ceftriaxone  in the past with no reaction   Penicillins Anaphylaxis, Hives and Rash   Vibramycin  [Doxycycline ] Anaphylaxis   Benadryl  [Diphenhydramine ] Itching   Dilaudid  [Hydromorphone ] Itching   Methotrexate And Trimetrexate Rash   Roxicodone  [Oxycodone ] Itching    Takes Percocet without issue    Antimicrobials this admission: CRO/Azithro x1 11/23 Cefepime  11/24 > Vancomycin  11/24 >    Microbiology results: 11/24 MRSA PCR: negative 11/23 Flu PCR: neg  Thank you for allowing pharmacy to be a part of this patient's care.  Sharyne Glatter, PharmD, BCCCP Critical Care Clinical Pharmacist 03/23/2024 3:56 PM

## 2024-03-24 ENCOUNTER — Other Ambulatory Visit (HOSPITAL_COMMUNITY): Payer: Self-pay

## 2024-03-24 ENCOUNTER — Inpatient Hospital Stay (HOSPITAL_COMMUNITY)

## 2024-03-24 DIAGNOSIS — Z992 Dependence on renal dialysis: Secondary | ICD-10-CM

## 2024-03-24 DIAGNOSIS — E109 Type 1 diabetes mellitus without complications: Secondary | ICD-10-CM | POA: Diagnosis not present

## 2024-03-24 DIAGNOSIS — R188 Other ascites: Secondary | ICD-10-CM

## 2024-03-24 DIAGNOSIS — R899 Unspecified abnormal finding in specimens from other organs, systems and tissues: Secondary | ICD-10-CM

## 2024-03-24 DIAGNOSIS — N186 End stage renal disease: Secondary | ICD-10-CM | POA: Diagnosis not present

## 2024-03-24 DIAGNOSIS — I5033 Acute on chronic diastolic (congestive) heart failure: Secondary | ICD-10-CM | POA: Diagnosis not present

## 2024-03-24 LAB — BASIC METABOLIC PANEL WITH GFR
Anion gap: 18 — ABNORMAL HIGH (ref 5–15)
BUN: 32 mg/dL — ABNORMAL HIGH (ref 6–20)
CO2: 19 mmol/L — ABNORMAL LOW (ref 22–32)
Calcium: 8 mg/dL — ABNORMAL LOW (ref 8.9–10.3)
Chloride: 98 mmol/L (ref 98–111)
Creatinine, Ser: 6.33 mg/dL — ABNORMAL HIGH (ref 0.44–1.00)
GFR, Estimated: 8 mL/min — ABNORMAL LOW (ref >60)
Glucose, Bld: 338 mg/dL — ABNORMAL HIGH (ref 70–99)
Potassium: 4.3 mmol/L (ref 3.5–5.1)
Sodium: 135 mmol/L (ref 135–145)

## 2024-03-24 LAB — GRAM STAIN

## 2024-03-24 LAB — CBC
HCT: 35 % — ABNORMAL LOW (ref 36.0–46.0)
Hemoglobin: 10.5 g/dL — ABNORMAL LOW (ref 12.0–15.0)
MCH: 27.2 pg (ref 26.0–34.0)
MCHC: 30 g/dL (ref 30.0–36.0)
MCV: 90.7 fL (ref 80.0–100.0)
Platelets: 239 K/uL (ref 150–400)
RBC: 3.86 MIL/uL — ABNORMAL LOW (ref 3.87–5.11)
RDW: 16.3 % — ABNORMAL HIGH (ref 11.5–15.5)
WBC: 12.1 K/uL — ABNORMAL HIGH (ref 4.0–10.5)
nRBC: 0 % (ref 0.0–0.2)

## 2024-03-24 LAB — GLUCOSE, CAPILLARY
Glucose-Capillary: 363 mg/dL — ABNORMAL HIGH (ref 70–99)
Glucose-Capillary: 318 mg/dL — ABNORMAL HIGH (ref 70–99)
Glucose-Capillary: 129 mg/dL — ABNORMAL HIGH (ref 70–99)

## 2024-03-24 LAB — HEPATITIS B SURFACE ANTIBODY, QUANTITATIVE: Hep B S AB Quant (Post): <3.5 m[IU]/mL — ABNORMAL LOW

## 2024-03-24 MED ORDER — INSULIN GLARGINE-YFGN 100 UNIT/ML ~~LOC~~ SOLN
5.0000 [IU] | SUBCUTANEOUS | Status: DC
  Administered 2024-03-24: 5 [IU] via SUBCUTANEOUS
  Filled 2024-03-24 (×2): qty 0.05

## 2024-03-24 MED ORDER — CEFUROXIME AXETIL 250 MG PO TABS
250.0000 mg | ORAL_TABLET | Freq: Every day | ORAL | Status: DC
  Administered 2024-03-24: 250 mg via ORAL
  Filled 2024-03-24: qty 1

## 2024-03-24 MED ORDER — MORPHINE SULFATE (PF) 2 MG/ML IV SOLN
INTRAVENOUS | Status: AC
  Filled 2024-03-24: qty 1

## 2024-03-24 MED ORDER — CEFUROXIME AXETIL 250 MG PO TABS
250.0000 mg | ORAL_TABLET | Freq: Every day | ORAL | 0 refills | Status: AC
  Filled 2024-03-24: qty 3, 3d supply, fill #0

## 2024-03-24 MED ORDER — INSULIN ASPART 100 UNIT/ML IJ SOLN
1.0000 [IU] | Freq: Once | INTRAMUSCULAR | Status: AC
  Administered 2024-03-24: 1 [IU] via SUBCUTANEOUS
  Filled 2024-03-24: qty 1

## 2024-03-24 MED ORDER — HEPARIN SODIUM (PORCINE) 1000 UNIT/ML IJ SOLN
INTRAMUSCULAR | Status: AC
  Filled 2024-03-24: qty 3

## 2024-03-24 NOTE — Discharge Summary (Signed)
 Physician Discharge Summary  Ashley Freeman FMW:[id number] DOB: [birth year] DOA: 03/21/2024  PCP: Keven Crumbly Pap, MD  Admit date: 03/21/2024 Discharge date: 03/24/2024 Recommendations for Outpatient Follow-up:  Follow up with PCP in 1 weeks-call for appointment Please obtain BMP/CBC in one week Follow-up with gastroenterology regarding recurrent ascites Follow up with Nephro for HD   Discharge Dispo: home Discharge Condition: Stable Code Status:   Code Status: Full Code Diet recommendation:  Diet Order             Diet renal/carb modified with fluid restriction           Diet renal/carb modified with fluid restriction Diet-HS Snack? Nothing; Fluid restriction: 1200 mL Fluid; Room service appropriate? Yes; Fluid consistency: Thin  Diet effective now                    Brief/Interim Summary: Ashley Freeman is a 33 y.o. female with PMH of ESRD on TTS HD, chronic HFrEF (EF 30-35%), T1DM, HTN, anemia of chronic renal disease, HLD, BPD, recent Right neck abscess s/p I&D 02/23/24 and recent admission for DKA 11/19 presented with missing dialysis because of not having a ride, her last dialysis was on Thursday and also feeling unwell, having ongoing cough, shortness of breath.  In the ED: VSS labs with leukocytosis beta SOB 0.6 hyperkalemia 5.3 gap 19 BNP >4500 CXR-airspace disease in the right lower lobe concerning for pneumonia.  Cardiomegaly. CT abdomen pelvis>>moderate to severe ascites, increased compared to prior exam.  Moderate right pleural effusion slightly increased compared to prior exam.  EKG demonstrated normal sinus rhythm with prolonged QT 501 Patient admitted for acute on chronic diastolic CHF possible CAP, moderate pleural effusion ascites Underwent paracentesis with improvement in the abdominal tightness and distention and pain Got dialyzed by nephrology. Initial concern for Gram stain positive but on further review by ID Gram stain  negative As per ID okay for discharge home  Subjective: Seen and examined  Alert awake oriented blood sugar running high no episodes of hypoglycemia Abdomen is less tender less swollen after paracentesis yesterday Eager to go home  Discharge Diagnoses:   Acute on chronic systolic CHF Fluid overload Moderate pleural effusion Large volume ascites-recurrent Missed dialysis: Patient with signs symptoms of fluid overload multifactorial due to acute on chronic systolic CHF, ESRD with missed HD. nephrology on board going for HD 11/25.   Blood pressure stable after resuming home Coreg  and Entresto  Continue home diuretic regimen on d/c.  X-ray worsening bilateral airspace disease-possibly reflecting edema or infection S/p paracentesis with improvement in pain and swelling-fluid shows gram-positive cocci in Gram stain>Initial concern for Gram stain positive but on further review by ID Gram stain negative. Regarding Ascites-recommend GI evaluation as outpatient, she had EGD 10/5.  2025 with hematemesis -I ordered internal referral for GI.  Suspected CAP: Will do po antibiotics for dc home -discussed with pharmacy patient has tolerated 3rd gen cephalosporin in past so will ado ceftin  250 mg daily and dc home once she tolerates.  ESRD on TTS HD Anemia of chronic renal disease Metabolic bone disease: Nephrology following got HD 11/23 and cont HD plan per nephro Continue sodium bicarb sevelamer  and monitor hemoglobin  T1DM w/ HLD with uncontrolled hyperglycemia and hypoglycemia, appears brittle DM: Recent admission for DKA.  PTA on Lantus  10 units daily and NovoLog  FlexPen 5 units Premeal Blood sugar as low as 14 on 11/23-following insulin  treatment for hyperkalemia.blood sugar trending up resuming 5 minutes Lantus  continue SSI  OFF D10 Recent Labs  Lab 03/23/24 2049 03/23/24 2204 03/24/24 0343 03/24/24 0624 03/24/24 1119  GLUCAP 269* 305* 363* 318* 129*    HTN: BP was poorly  controlled and stabilized after resuming home Coreg  and Entresto  continue diuretics  BPD Seizure disorder: Continue her Lamictal , Zyprexa  at bedtime.  DVT prophylaxis: heparin  injection 5,000 Units Start: 03/22/24 0600 Code Status:   Code Status: Full Code Family Communication: plan of care discussed with patient at bedside. Patient status is: Remains hospitalized because of severity of illness Level of care: Progressive   Dispo: The patient is from: home            Anticipated disposition: TBD.  Objective: Vitals last 24 hrs: Vitals:   03/24/24 0720 03/24/24 1120 03/24/24 1328 03/24/24 1334  BP:   (!) 129/56 (!) 146/56  Pulse: 77 71 85 80  Resp: 16 17 18 17   Temp:   97.9 F (36.6 C)   TempSrc: Oral Oral    SpO2: 97% 98% 98% 96%  Weight:   59.7 kg   Height:       Physical Examination: General exam: aaox3, pleasant  HEENT:Oral mucosa moist, Ear/Nose WNL grossly Respiratory system: cta Bilaterally Cardiovascular system: S1 & S2 +, No JVD. Gastrointestinal system: Abdomen soft less distended less tender Nervous System: Alert, awake, moving all extremities,and following commands. Extremities: extremities warm, leg edema neg, RUE AVG+ Skin: Warm, no rashes MSK: Normal muscle bulk,tone, power   Medications reviewed:  Scheduled Meds:  bumetanide  (BUMEX ) IV  1 mg Intravenous Q12H   carvedilol   25 mg Oral BID WC   cefUROXime   250 mg Oral QHS   Chlorhexidine  Gluconate Cloth  6 each Topical Q0600   Chlorhexidine  Gluconate Cloth  6 each Topical Q0600   DULoxetine   20 mg Oral Daily   fluticasone   2 spray Each Nare Daily   heparin   2,500 Units Dialysis Once in dialysis   heparin   5,000 Units Subcutaneous Q8H   insulin  aspart  0-6 Units Subcutaneous TID WC   insulin  glargine-yfgn  5 Units Subcutaneous Q24H   lamoTRIgine   200 mg Oral Daily   OLANZapine  zydis  5 mg Oral QHS   pantoprazole   40 mg Oral BID   sacubitril -valsartan   1 tablet Oral BID   sevelamer  carbonate  800 mg  Oral TID WC   sodium bicarbonate   650 mg Oral BID   Continuous Infusions:  anticoagulant sodium citrate      Diet: Diet Order             Diet renal/carb modified with fluid restriction           Diet renal/carb modified with fluid restriction Diet-HS Snack? Nothing; Fluid restriction: 1200 mL Fluid; Room service appropriate? Yes; Fluid consistency: Thin  Diet effective now                     Consultation: Nephrology ID IR  Discharge Instructions  Discharge Instructions     Ambulatory referral to Gastroenterology   Complete by: As directed    What is the reason for referral?: Other Comment - recurrent ascites   Diet renal/carb modified with fluid restriction   Complete by: As directed    Discharge instructions   Complete by: As directed    Please call call MD or return to ER for similar or worsening recurring problem that brought you to hospital or if any fever,nausea/vomiting,abdominal pain, uncontrolled pain, chest pain,  shortness of breath or any other alarming symptoms.  Please follow-up your doctor as instructed in a week time and call the office for appointment.  Please avoid alcohol , smoking, or any other illicit substance and maintain healthy habits including taking your regular medications as prescribed.  You were cared for by a hospitalist during your hospital stay. If you have any questions about your discharge medications or the care you received while you were in the hospital after you are discharged, you can call the unit and ask to speak with the hospitalist on call if the hospitalist that took care of you is not available.  Once you are discharged, your primary care physician will handle any further medical issues. Please note that NO REFILLS for any discharge medications will be authorized once you are discharged, as it is imperative that you return to your primary care physician (or establish a relationship with a primary care physician if you do not  have one) for your aftercare needs so that they can reassess your need for medications and monitor your lab values   Increase activity slowly   Complete by: As directed    No wound care   Complete by: As directed       Allergies as of 03/24/2024       Reactions   Keflex  [cephalexin ] Anaphylaxis   Ceftriaxone  in the past with no reaction   Penicillins Anaphylaxis, Hives, Rash   Vibramycin  [doxycycline ] Anaphylaxis   Benadryl  [diphenhydramine ] Itching   Dilaudid  [hydromorphone ] Itching   Methotrexate And Trimetrexate Rash   Roxicodone  [oxycodone ] Itching   Takes Percocet without issue        Medication List     STOP taking these medications    HYDROcodone -acetaminophen  5-325 MG tablet Commonly known as: NORCO/VICODIN   ketoconazole  2 % cream Commonly known as: NIZORAL        TAKE these medications    albuterol  108 (90 Base) MCG/ACT inhaler Commonly known as: VENTOLIN  HFA Inhale 2 puffs into the lungs every 4 (four) hours as needed for wheezing or shortness of breath.   bumetanide  2 MG tablet Commonly known as: BUMEX  Take 10 mg by mouth daily.   carvedilol  25 MG tablet Commonly known as: COREG  Take 25 mg by mouth 2 (two) times daily with a meal.   cefUROXime  250 MG tablet Commonly known as: CEFTIN  Take 1 tablet (250 mg total) by mouth at bedtime for 3 days.   DULoxetine  20 MG capsule Commonly known as: CYMBALTA  Take 20 mg by mouth daily.   Entresto  24-26 MG Generic drug: sacubitril -valsartan  Take 1 tablet by mouth 2 (two) times daily.   fluticasone  50 MCG/ACT nasal spray Commonly known as: FLONASE  Place 2 sprays into both nostrils daily.   hydrOXYzine  25 MG tablet Commonly known as: ATARAX  Take 25 mg by mouth 3 (three) times daily as needed for anxiety, itching, nausea or vomiting.   lamoTRIgine  200 MG tablet Commonly known as: LAMICTAL  Take 1 tablet (200 mg total) by mouth daily.   Lantus  SoloStar 100 UNIT/ML Solostar Pen Generic drug: insulin   glargine Inject 10 Units into the skin daily.   metoCLOPramide  10 MG tablet Commonly known as: REGLAN  Take 0.5 tablets (5 mg total) by mouth every 8 (eight) hours as needed for nausea or refractory nausea / vomiting.   NovoLOG  FlexPen 100 UNIT/ML FlexPen Generic drug: insulin  aspart Inject 5 Units into the skin 3 (three) times daily with meals.   OLANZapine  zydis 5 MG disintegrating tablet Commonly known as: ZYPREXA  Take 5 mg by mouth at bedtime.   pantoprazole   40 MG tablet Commonly known as: PROTONIX  Take 1 tablet (40 mg total) by mouth 2 (two) times daily.   prochlorperazine  5 MG tablet Commonly known as: COMPAZINE  Take 5 mg by mouth every 6 (six) hours as needed for nausea or vomiting.   rosuvastatin  40 MG tablet Commonly known as: CRESTOR  Take 40 mg by mouth daily.   sevelamer  carbonate 800 MG tablet Commonly known as: RENVELA  Take 1 tablet (800 mg total) by mouth 3 (three) times daily with meals.   sodium bicarbonate  650 MG tablet Take 650 mg by mouth 2 (two) times daily.   sucralfate  1 GM/10ML suspension Commonly known as: CARAFATE  Take 10 mLs (1 g total) by mouth 2 (two) times daily for 14 days.   SUMAtriptan  50 MG tablet Commonly known as: IMITREX  Take 50 mg by mouth every 2 (two) hours as needed for migraine or headache.        Follow-up Information     [name], [name], MD Follow up.   Specialty: Family Medicine Why: office will patient to set up tele visit for hospital follow and then they will schedule her a hospital follow up with her provider Contact information: [address] [place] [address] [phone]                Allergies  Allergen Reactions   Keflex  [Cephalexin ] Anaphylaxis    Ceftriaxone  in the past with no reaction   Penicillins Anaphylaxis, Hives and Rash   Vibramycin  [Doxycycline ] Anaphylaxis   Benadryl  [Diphenhydramine ] Itching   Dilaudid  [Hydromorphone ] Itching   Methotrexate And Trimetrexate Rash   Roxicodone   [Oxycodone ] Itching    Takes Percocet without issue    The results of significant diagnostics from this hospitalization (including imaging, microbiology, ancillary and laboratory) are listed below for reference.    Microbiology: Recent Results (from the past 240 hours)  Resp panel by RT-PCR (RSV, Flu A&B, Covid) Anterior Nasal Swab     Status: None   Collection Time: 03/22/24  3:31 AM   Specimen: Anterior Nasal Swab  Result Value Ref Range Status   SARS Coronavirus 2 by RT PCR NEGATIVE NEGATIVE Final   Influenza A by PCR NEGATIVE NEGATIVE Final   Influenza B by PCR NEGATIVE NEGATIVE Final    Comment: (NOTE) The Xpert Xpress SARS-CoV-2/FLU/RSV plus assay is intended as an aid in the diagnosis of influenza from Nasopharyngeal swab specimens and should not be used as a sole basis for treatment. Nasal washings and aspirates are unacceptable for Xpert Xpress SARS-CoV-2/FLU/RSV testing.  Fact Sheet for Patients: bloggercourse.com  Fact Sheet for Healthcare Providers: seriousbroker.it  This test is not yet approved or cleared by the [place]  FDA and has been authorized for detection and/or diagnosis of SARS-CoV-2 by FDA under an Emergency Use Authorization (EUA). This EUA will remain in effect (meaning this test can be used) for the duration of the COVID-19 declaration under Section 564(b)(1) of the Act, 21 U.S.C. section 360bbb-3(b)(1), unless the authorization is terminated or revoked.     Resp Syncytial Virus by PCR NEGATIVE NEGATIVE Final    Comment: (NOTE) Fact Sheet for Patients: bloggercourse.com  Fact Sheet for Healthcare Providers: seriousbroker.it  This test is not yet approved or cleared by the [place]  FDA and has been authorized for detection and/or diagnosis of SARS-CoV-2 by FDA under an Emergency Use Authorization (EUA). This EUA will remain in effect  (meaning this test can be used) for the duration of the COVID-19 declaration under Section 564(b)(1) of the Act,  21 U.S.C. section 360bbb-3(b)(1), unless the authorization is terminated or revoked.  Performed at [facility], [address] [place], [place] [address]   MRSA Next Gen by PCR, Nasal     Status: None   Collection Time: 03/23/24  1:26 AM   Specimen: Nasal Mucosa; Nasal Swab  Result Value Ref Range Status   MRSA by PCR Next Gen NOT DETECTED NOT DETECTED Final    Comment: (NOTE) The GeneXpert MRSA Assay (FDA approved for NASAL specimens only), is one component of a comprehensive MRSA colonization surveillance program. It is not intended to diagnose MRSA infection nor to guide or monitor treatment for MRSA infections. Test performance is not FDA approved in patients less than 44 years old. Performed at [facility], [address] [place], [place] [address]   Gram stain     Status: None   Collection Time: 03/23/24  1:37 PM   Specimen: Abdomen; Peritoneal Fluid  Result Value Ref Range Status   Specimen Description PERITONEAL  Final   Special Requests NONE  Final   Gram Stain   Final    WBC PRESENT, PREDOMINANTLY MONONUCLEAR CORRECTED RESULTS CORRECTED ON 11/25 AT 1214: PREVIOUSLY REPORTED AS GRAM POSITIVE COCCI NO ORGANISMS SEEN PREVIOUSLY REPORTED AS: GRAM POSITIVE COCCI CORRECTED RESULTS CALLED TO: PHARMD EMILY S [id number] FCP Performed at [facility], [address] [place], [place] [address]    Report Status 03/24/2024 FINAL  Final  Culture, body fluid w Gram Stain-bottle     Status: None (Preliminary result)   Collection Time: 03/23/24  2:14 PM   Specimen: Peritoneal Washings  Result Value Ref Range Status   Specimen Description PERITONEAL  Final   Special Requests NONE  Final   Gram Stain PENDING  Incomplete   Culture   Final    NO GROWTH < 24 HOURS Performed at [facility], [address] [place], [place] [address]     Report Status PENDING  Incomplete    Procedures/Studies: IR Paracentesis Result Date: 03/23/2024 INDICATION: History of end-stage renal disease on hemodialysis with recurrent ascites. Request for therapeutic paracentesis. EXAM: ULTRASOUND GUIDED THERAPEUTIC PARACENTESIS MEDICATIONS: 8 mL 1% lidocaine  COMPLICATIONS: None immediate. PROCEDURE: Informed written consent was obtained from the patient after a discussion of the risks, benefits and alternatives to treatment. A timeout was performed prior to the initiation of the procedure. Initial ultrasound scanning demonstrates a moderate amount of ascites within the right lower abdominal quadrant. The right lower abdomen was prepped and draped in the usual sterile fashion. 1% lidocaine  was used for local anesthesia. Following this, a 19 gauge, 7-cm, Yueh catheter was introduced. An ultrasound image was saved for documentation purposes. The paracentesis was performed. The catheter was removed and a dressing was applied. The patient tolerated the procedure well without immediate post procedural complication. Patient received post-procedure intravenous albumin ; see nursing notes for details. FINDINGS: A total of approximately 6.2 liters of hazy yellow fluid was removed. IMPRESSION: Successful ultrasound-guided paracentesis yielding 6.2 liters of peritoneal fluid. Performed by: Wyatt Pommier, PA-C Electronically Signed   By: JONETTA Faes M.D.   On: 03/23/2024 16:25   DG Chest Port 1 View Result Date: 03/23/2024 EXAM: 1 VIEW(S) XRAY OF THE CHEST 03/23/2024 12:30:00 AM COMPARISON: 03/21/2024. CLINICAL HISTORY: SOB (shortness of breath). FINDINGS: LUNGS AND PLEURA: Worsening bilaterally airspace disease could reflect edema or infection. Suspect small right pleural effusion. Low lung volumes. No pneumothorax. HEART AND MEDIASTINUM: Cardiomegaly. BONES AND SOFT TISSUES: No  acute osseous abnormality. IMPRESSION: 1. Worsening bilateral airspace disease, possibly reflecting  edema or infection. 2. Suspected small right pleural effusion. 3. Cardiomegaly. Electronically signed by: Franky Crease MD 03/23/2024 12:42 AM EST RP Workstation: [id number]   CT ABDOMEN PELVIS WO CONTRAST Result Date: 03/22/2024 EXAM: CT ABDOMEN AND PELVIS WITHOUT CONTRAST 03/22/2024 01:57:31 AM TECHNIQUE: CT of the abdomen and pelvis was performed without the administration of intravenous contrast. Multiplanar reformatted images are provided for review. Automated exposure control, iterative reconstruction, and/or weight-based adjustment of the mA/kV was utilized to reduce the radiation dose to as low as reasonably achievable. COMPARISON: 03/08/2024 CLINICAL HISTORY: Distended abdomen. FINDINGS: LOWER CHEST: Moderate right-sided pleural effusion is noted, slightly increased when compared with the prior exam. LIVER: The liver is within normal limits. GALLBLADDER AND BILE DUCTS: The gallbladder has been surgically removed. No biliary ductal dilatation. SPLEEN: The spleen is unremarkable. PANCREAS: The pancreas is unremarkable. ADRENAL GLANDS: The adrenal glands are well visualized without acute abnormality. KIDNEYS, URETERS AND BLADDER: The kidneys are well visualized without acute abnormality. Renal vascular calcifications are seen. The bladder is partially distended. No stones in the kidneys or ureters. GI AND BOWEL: Stomach and small bowel are within normal limits. No obstructive or inflammatory changes of the colon are seen. The appendix is not well visualized and may have been surgically removed. No inflammatory changes to suggest appendicitis are seen. PERITONEUM AND RETROPERITONEUM: Moderate to severe ascites is noted, increased when compared with the prior exam. No free air. VASCULATURE: Diffuse vascular calcifications are noted in the visceral vessels. The aorta shows no aneurysmal dilatation. LYMPH NODES: No lymphadenopathy. REPRODUCTIVE ORGANS: The uterus and adnexa are within normal limits. BONES AND  SOFT TISSUES: Changes of anasarca are noted in the subcutaneous tissues. IMPRESSION: 1. Moderate to severe ascites, increased compared to the prior exam. 2. Moderate right pleural effusion, slightly increased compared to the prior exam. Electronically signed by: Oneil Devonshire MD 03/22/2024 02:03 AM EST RP Workstation: [id number]   DG Chest Portable 1 View Result Date: 03/21/2024 EXAM: 1 VIEW(S) XRAY OF THE CHEST 03/21/2024 07:26:00 PM COMPARISON: 03/16/2024 CLINICAL HISTORY: Shortness of breath FINDINGS: LUNGS AND PLEURA: Low lung volumes. Airspace disease in the right lower lobe concerning for pneumonia. No pleural effusion. No pneumothorax. HEART AND MEDIASTINUM: Cardiomegaly. BONES AND SOFT TISSUES: No acute osseous abnormality. IMPRESSION: 1. Airspace disease in the right lower lobe concerning for pneumonia. 2. Cardiomegaly. Electronically signed by: Franky Crease MD 03/21/2024 07:33 PM EST RP Workstation: [id number]   DG Chest Portable 1 View Result Date: 03/16/2024 CLINICAL DATA:  Chest pain, hyperglycemia. EXAM: PORTABLE CHEST 1 VIEW COMPARISON:  03/08/2024 and CT chest 07/15/2023. FINDINGS: Trachea is midline. Heart is enlarged. Lungs are low in volume with mild interstitial prominence and indistinctness. No pleural fluid. A vascular stent is partially imaged in the proximal right upper arm. IMPRESSION: Mild pulmonary edema. Electronically Signed   By: Newell Eke M.D.   On: 03/16/2024 17:44   CT ABDOMEN PELVIS WO CONTRAST Result Date: 03/08/2024 EXAM: CT ABDOMEN AND PELVIS WITHOUT CONTRAST 03/08/2024 08:45:12 AM TECHNIQUE: CT of the abdomen and pelvis was performed without the administration of intravenous contrast. Multiplanar reformatted images are provided for review. Automated exposure control, iterative reconstruction, and/or weight-based adjustment of the mA/kV was utilized to reduce the radiation dose to as low as reasonably achievable. COMPARISON: Similar to the previous exam. CLINICAL  HISTORY: Abdominal pain, acute, nonlocalized. FINDINGS: LOWER CHEST: Heterogeneous ground glass attenuation is identified within both lung bases along with mild interstitial  thickening suggesting pulmonary edema. Small right pleural effusion. LIVER: The liver is unremarkable. GALLBLADDER AND BILE DUCTS: Status post cholecystectomy. No biliary ductal dilatation. SPLEEN: The spleen is within normal limits in size and appearance. PANCREAS: The pancreas is normal in size and contour without focal lesion or ductal dilatation. ADRENAL GLANDS: Normal size and morphology bilaterally. No nodule, thickening, or hemorrhage. No periadrenal stranding. KIDNEYS, URETERS AND BLADDER: No stones in the kidneys or ureters. No hydronephrosis. No perinephric or periureteral stranding. The urinary bladder is decompressed. GI AND BOWEL: Stomach demonstrates no acute abnormality. The appendix is visualized and normal in caliber, without wall thickening, periappendiceal inflammation, or fluid. There is no bowel obstruction. PERITONEUM AND RETROPERITONEUM: Moderate volume of ascites noted within the abdomen and pelvis, similar to the previous exam. No free air. VASCULATURE: Aorta is normal in caliber. Extensive vascular calcifications are again noted. LYMPH NODES: No lymphadenopathy. REPRODUCTIVE ORGANS: Uterus and adnexal structures are unremarkable. BONES AND SOFT TISSUES: No acute osseous abnormality. Diffuse body wall edema compatible with anasarca. IMPRESSION: 1. Findings of pulmonary edema with small right pleural effusion. 2. Moderate ascites, similar to prior. 3. Diffuse body wall edema compatible with anasarca. Electronically signed by: Waddell Calk MD 03/08/2024 08:52 AM EST RP Workstation: [id number]   DG Chest Port 1 View Result Date: 03/08/2024 CLINICAL DATA:  Hypoglycemia, unresponsive, concern for sepsis EXAM: PORTABLE CHEST 1 VIEW COMPARISON:  02/22/2024 FINDINGS: Similar mild cardiomegaly without acute airspace process,  pneumonia, collapse, consolidation, edema pattern, or CHF. No effusion or pneumothorax. Trachea midline. Monitor leads overlie the chest. Right upper arm axillary stent noted. IMPRESSION: Cardiomegaly without acute process. Electronically Signed   By: CHRISTELLA.  Shick M.D.   On: 03/08/2024 08:06    Labs: BNP (last 3 results) Recent Labs    01/04/24 0424 01/16/24 0608 03/21/24 2153  BNP >4,500.0* 4,044.0* >4,500.0*   Basic Metabolic Panel: Recent Labs  Lab 03/18/24 0706 03/21/24 2151 03/21/24 2200 03/21/24 2204 03/23/24 0619 03/24/24 0547  NA 136 134* 133* 134* 131* 135  K 4.4 5.3* 5.3* 5.4* 3.8 4.3  CL 97* 94*  --  101 92* 98  CO2 22 21*  --   --  22 19*  GLUCOSE 127* 461*  --  438* 114* 338*  BUN 28* 42*  --  42* 26* 32*  CREATININE 6.34* 7.74*  --  7.50* 5.41* 6.33*  CALCIUM  8.5* 8.6*  --   --  8.1* 8.0*   Liver Function Tests: Recent Labs  Lab 03/21/24 2151  AST 93*  ALT 31  ALKPHOS 745*  BILITOT 1.2  PROT 5.9*  ALBUMIN  3.1*   No results for input(s): LIPASE, AMYLASE in the last 168 hours. No results for input(s): AMMONIA in the last 168 hours. CBC: Recent Labs  Lab 03/21/24 2151 03/21/24 2200 03/21/24 2204 03/23/24 0619 03/24/24 0547  WBC 13.4*  --   --  11.7* 12.1*  NEUTROABS  --   --   --  4.5  --   HGB 11.1* 12.9 13.3 12.2 10.5*  HCT 37.3 38.0 39.0 39.4 35.0*  MCV 89.9  --   --  87.6 90.7  PLT 272  --   --  263 239   CBG: Recent Labs  Lab 03/23/24 2049 03/23/24 2204 03/24/24 0343 03/24/24 0624 03/24/24 1119  GLUCAP 269* 305* 363* 318* 129*   Hgb A1c No results for input(s): HGBA1C in the last 72 hours. Anemia work up No results for input(s): VITAMINB12, FOLATE, FERRITIN, TIBC, IRON , RETICCTPCT in the last  72 hours. Cardiac Enzymes: No results for input(s): CKTOTAL, CKMB, CKMBINDEX, TROPONINI in the last 168 hours. BNP: Invalid input(s): POCBNP D-Dimer No results for input(s): DDIMER in the last 72  hours. Lipid Profile No results for input(s): CHOL, HDL, LDLCALC, TRIG, CHOLHDL, LDLDIRECT in the last 72 hours. Thyroid  function studies No results for input(s): TSH, T4TOTAL, T3FREE, THYROIDAB in the last 72 hours.  Invalid input(s): FREET3 Urinalysis    Component Value Date/Time   COLORURINE YELLOW 10/25/2023 2300   APPEARANCEUR CLOUDY (A) 10/25/2023 2300   APPEARANCEUR Hazy 11/10/2013 2043   LABSPEC 1.016 10/25/2023 2300   LABSPEC 1.031 11/10/2013 2043   PHURINE 6.0 10/25/2023 2300   GLUCOSEU >=500 (A) 10/25/2023 2300   GLUCOSEU >=500 11/10/2013 2043   HGBUR SMALL (A) 10/25/2023 2300   BILIRUBINUR NEGATIVE 10/25/2023 2300   BILIRUBINUR negative 06/04/2018 1035   BILIRUBINUR Negative 11/24/2015 1443   BILIRUBINUR Negative 11/10/2013 2043   KETONESUR 5 (A) 10/25/2023 2300   PROTEINUR >=300 (A) 10/25/2023 2300   UROBILINOGEN 0.2 09/14/2019 1732   NITRITE NEGATIVE 10/25/2023 2300   LEUKOCYTESUR NEGATIVE 10/25/2023 2300   LEUKOCYTESUR 1+ 11/10/2013 2043   Sepsis Labs Recent Labs  Lab 03/21/24 2151 03/23/24 0619 03/24/24 0547  WBC 13.4* 11.7* 12.1*   Microbiology Recent Results (from the past 240 hours)  Resp panel by RT-PCR (RSV, Flu A&B, Covid) Anterior Nasal Swab     Status: None   Collection Time: 03/22/24  3:31 AM   Specimen: Anterior Nasal Swab  Result Value Ref Range Status   SARS Coronavirus 2 by RT PCR NEGATIVE NEGATIVE Final   Influenza A by PCR NEGATIVE NEGATIVE Final   Influenza B by PCR NEGATIVE NEGATIVE Final    Comment: (NOTE) The Xpert Xpress SARS-CoV-2/FLU/RSV plus assay is intended as an aid in the diagnosis of influenza from Nasopharyngeal swab specimens and should not be used as a sole basis for treatment. Nasal washings and aspirates are unacceptable for Xpert Xpress SARS-CoV-2/FLU/RSV testing.  Fact Sheet for Patients: bloggercourse.com  Fact Sheet for Healthcare  Providers: seriousbroker.it  This test is not yet approved or cleared by the [place]  FDA and has been authorized for detection and/or diagnosis of SARS-CoV-2 by FDA under an Emergency Use Authorization (EUA). This EUA will remain in effect (meaning this test can be used) for the duration of the COVID-19 declaration under Section 564(b)(1) of the Act, 21 U.S.C. section 360bbb-3(b)(1), unless the authorization is terminated or revoked.     Resp Syncytial Virus by PCR NEGATIVE NEGATIVE Final    Comment: (NOTE) Fact Sheet for Patients: bloggercourse.com  Fact Sheet for Healthcare Providers: seriousbroker.it  This test is not yet approved or cleared by the [place]  FDA and has been authorized for detection and/or diagnosis of SARS-CoV-2 by FDA under an Emergency Use Authorization (EUA). This EUA will remain in effect (meaning this test can be used) for the duration of the COVID-19 declaration under Section 564(b)(1) of the Act, 21 U.S.C. section 360bbb-3(b)(1), unless the authorization is terminated or revoked.  Performed at [facility], [address] [place], [place] [address]   MRSA Next Gen by PCR, Nasal     Status: None   Collection Time: 03/23/24  1:26 AM   Specimen: Nasal Mucosa; Nasal Swab  Result Value Ref Range Status   MRSA by PCR Next Gen NOT DETECTED NOT DETECTED Final    Comment: (NOTE) The GeneXpert MRSA Assay (FDA approved for NASAL specimens only), is one component of a comprehensive  MRSA colonization surveillance program. It is not intended to diagnose MRSA infection nor to guide or monitor treatment for MRSA infections. Test performance is not FDA approved in patients less than 51 years old. Performed at [facility], [address], [place], [place] [address]   Gram stain     Status: None   Collection Time: 03/23/24  1:37 PM   Specimen: Abdomen; Peritoneal  Fluid  Result Value Ref Range Status   Specimen Description PERITONEAL  Final   Special Requests NONE  Final   Gram Stain   Final    WBC PRESENT, PREDOMINANTLY MONONUCLEAR CORRECTED RESULTS CORRECTED ON 11/25 AT 1214: PREVIOUSLY REPORTED AS GRAM POSITIVE COCCI NO ORGANISMS SEEN PREVIOUSLY REPORTED AS: GRAM POSITIVE COCCI CORRECTED RESULTS CALLED TO: PHARMD EMILY S [id number] FCP Performed at [facility], [address], [place], [place] [address]    Report Status 03/24/2024 FINAL  Final  Culture, body fluid w Gram Stain-bottle     Status: None (Preliminary result)   Collection Time: 03/23/24  2:14 PM   Specimen: Peritoneal Washings  Result Value Ref Range Status   Specimen Description PERITONEAL  Final   Special Requests NONE  Final   Gram Stain PENDING  Incomplete   Culture   Final    NO GROWTH < 24 HOURS Performed at [facility], [address], [place], [place] [address]    Report Status PENDING  Incomplete    Time coordinating discharge: 35  minutes  SIGNED: Mennie LAMY, MD  [facility] 03/24/2024, 4:57 PM  If 7PM-7AM, please contact night-coverage [email or web address]

## 2024-03-24 NOTE — TOC Transition Note (Signed)
 Transition of Care [facility]) - Discharge Note   Patient Details  Name: Ashley Freeman MRN: [id number] Date of Birth: 1990/07/31  Transition of Care [facility]) CM/SW Contact:  Waddell Barnie Rama, RN Phone Number: 03/24/2024, 12:33 PM   Clinical Narrative:    For dc home today, patient states she is going to call her Medicaid transport to take her home.       Barriers to Discharge: Continued Medical Work up   Patient Goals and CMS Choice Patient states their goals for this hospitalization and ongoing recovery are:: return home   Choice offered to / list presented to : NA      Discharge Placement                       Discharge Plan and Services Additional resources added to the After Visit Summary for   In-house Referral: NA Discharge Planning Services: CM Consult Post Acute Care Choice: NA          DME Arranged: N/A DME Agency: NA       HH Arranged: NA          Social Drivers of Health (SDOH) Interventions SDOH Screenings   Food Insecurity: No Food Insecurity (03/22/2024)  Housing: Low Risk  (03/22/2024)  Transportation Needs: No Transportation Needs (03/22/2024)  Utilities: Not At Risk (03/22/2024)  Depression (PHQ2-9): Medium Risk (03/02/2020)  Financial Resource Strain: Medium Risk (11/15/2023)   Received [facility]  Physical Activity: Insufficiently Active (03/02/2022)   Received from [facility]  Social Connections: Moderately Integrated (02/04/2024)  Stress: No Stress Concern Present (03/08/2023)   Received from [facility]  Tobacco Use: Low Risk  (03/16/2024)     Readmission Risk Interventions    03/23/2024    2:19 PM 02/06/2024    2:01 PM 11/28/2023   11:49 AM  Readmission Risk Prevention Plan  Transportation Screening Complete Complete Complete  Medication Review [employer]) Complete Referral to Pharmacy Complete  PCP or Specialist appointment within 3-5 days of discharge Complete Complete Complete  HRI or  Home Care Consult Complete Complete Complete  SW Recovery Care/Counseling Consult  Complete   Palliative Care Screening Not Applicable Not Applicable Not Applicable  Skilled Nursing Facility Not Applicable Not Applicable Not Applicable

## 2024-03-24 NOTE — Procedures (Signed)
 HD Note:  Some information was entered later than the data was gathered due to patient care needs. The stated time with the data is accurate.  Received patient in bed to unit.   Alert and oriented.   Informed consent signed and in chart.   Access used: Upper right arm AV fistula Access issues: None  Patient tolerated treatment well.  She had pain, medication given.  See MAR  TX duration: 3.25 hours  Alert, without acute distress.  Total UF removed: 3000 ml  Hand-off given to patient's nurse.   Transported back to the room   Aleese Kamps L. Lenon, RN Kidney Dialysis Unit.

## 2024-03-24 NOTE — Progress Notes (Signed)
 D/C order noted. Reviewed nephrology note for today which states pt is for possible d/c today after HD treatment. Contacted [employer] and spoke to [employer]. Clinic advised pt should d/c today and should resume care tomorrow on holiday schedule. Navigator will fax d/c summary and today's renal note for continuation of care once d/c summary is available.   Randine Mungo Dialysis Navigator [phone]

## 2024-03-24 NOTE — Progress Notes (Signed)
 PROGRESS NOTE Ashley Freeman  FMW:[id number] DOB: 1990/09/30 DOA: 03/21/2024 PCP: Keven Crumbly Pap, MD  Brief Narrative/Hospital Course: Ashley Freeman is a 33 y.o. female with PMH of ESRD on TTS HD, chronic HFrEF (EF 30-35%), T1DM, HTN, anemia of chronic renal disease, HLD, BPD, recent Right neck abscess s/p I&D 02/23/24 and recent admission for DKA 11/19 presented with missing dialysis because of not having a ride, her last dialysis was on Thursday and also feeling unwell, having ongoing cough, shortness of breath.  In the ED: VSS labs with leukocytosis beta SOB 0.6 hyperkalemia 5.3 gap 19 BNP >4500 CXR-airspace disease in the right lower lobe concerning for pneumonia.  Cardiomegaly. CT abdomen pelvis>>moderate to severe ascites, increased compared to prior exam.  Moderate right pleural effusion slightly increased compared to prior exam.  EKG demonstrated normal sinus rhythm with prolonged QT 501 Patient admitted for acute on chronic diastolic CHF possible CAP, moderate pleural effusion ascites  Subjective: Seen and examined  Alert awake oriented blood sugar running high no episodes of hypoglycemia Abdomen is less tender less swollen after paracentesis yesterday Eager to go home  Assessment and plan:  Acute on chronic systolic CHF Fluid overload Moderate pleural effusion Large volume ascites-recurrent Missed dialysis: Patient with signs symptoms of fluid overload multifactorial due to acute on chronic systolic CHF, ESRD with missed HD. nephrology on board going for HD 11/25.   Blood pressure stable after resuming home Coreg  and Entresto  Continue IV Bumex , Monitor daily weight and restrict fluids and salt intake X-ray worsening bilateral airspace disease-possibly reflecting edema or infection S/p paracentesis with improvement in pain and swelling-fluid shows gram-positive cocci in Gram stain cultures pending keeping on vancomycin -with HD and ID is  following  Suspected CAP: Continue current cefepime  and vancomycin .  Respiratory status is stable  ESRD on TTS HD Anemia of chronic renal disease Metabolic bone disease: Nephrology following got HD 11/23 and going for HD again today. Continue sodium bicarb sevelamer  and monitor hemoglobin  T1DM w/ HLD with uncontrolled hyperglycemia and hypoglycemia, appears brittle DM: Recent admission for DKA.  PTA on Lantus  10 units daily and NovoLog  FlexPen 5 units Premeal Blood sugar as low as 14 on 11/23-following insulin  treatment for hyperkalemia.blood sugar trending up resuming 5 minutes Lantus  continue SSI  OFF D10 Recent Labs  Lab 03/23/24 2049 03/23/24 2204 03/24/24 0343 03/24/24 0624 03/24/24 1119  GLUCAP 269* 305* 363* 318* 129*    HTN: BP was poorly controlled and stabilized after resuming home Coreg  and Entresto  continue diuretics  BPD Seizure disorder: Continue her Lamictal , Zyprexa  at bedtime.  DVT prophylaxis: heparin  injection 5,000 Units Start: 03/22/24 0600 Code Status:   Code Status: Full Code Family Communication: plan of care discussed with patient at bedside. Patient status is: Remains hospitalized because of severity of illness Level of care: Progressive   Dispo: The patient is from: home            Anticipated disposition: TBD.  Objective: Vitals last 24 hrs: Vitals:   03/24/24 0028 03/24/24 0343 03/24/24 0501 03/24/24 0720  BP: (!) 140/82 131/70    Pulse: 75 72  77  Resp: 18 18  16   Temp: 98 F (36.7 C) 98 F (36.7 C)    TempSrc: Oral Oral  Oral  SpO2: 92% 95%  97%  Weight:   61.5 kg   Height:       Physical Examination: General exam: aaox3, pleasant  HEENT:Oral mucosa moist, Ear/Nose WNL grossly Respiratory system: cta Bilaterally Cardiovascular system: S1 &  S2 +, No JVD. Gastrointestinal system: Abdomen soft less distended less tender Nervous System: Alert, awake, moving all extremities,and following commands. Extremities: extremities warm,  leg edema neg, RUE AVG+ Skin: Warm, no rashes MSK: Normal muscle bulk,tone, power   Medications reviewed:  Scheduled Meds:  bumetanide  (BUMEX ) IV  1 mg Intravenous Q12H   carvedilol   25 mg Oral BID WC   Chlorhexidine  Gluconate Cloth  6 each Topical Q0600   Chlorhexidine  Gluconate Cloth  6 each Topical Q0600   DULoxetine   20 mg Oral Daily   fluticasone   2 spray Each Nare Daily   heparin   2,500 Units Dialysis Once in dialysis   heparin   5,000 Units Subcutaneous Q8H   insulin  aspart  0-6 Units Subcutaneous TID WC   insulin  glargine-yfgn  5 Units Subcutaneous Q24H   lamoTRIgine   200 mg Oral Daily   OLANZapine  zydis  5 mg Oral QHS   pantoprazole   40 mg Oral BID   sacubitril -valsartan   1 tablet Oral BID   sevelamer  carbonate  800 mg Oral TID WC   sodium bicarbonate   650 mg Oral BID   Continuous Infusions:  anticoagulant sodium citrate      Diet: Diet Order             Diet renal/carb modified with fluid restriction Diet-HS Snack? Nothing; Fluid restriction: 1200 mL Fluid; Room service appropriate? Yes; Fluid consistency: Thin  Diet effective now                    Data Reviewed: I have personally reviewed following labs and imaging studies ( see epic result tab) CBC: Recent Labs  Lab 03/21/24 2151 03/21/24 2200 03/21/24 2204 03/23/24 0619 03/24/24 0547  WBC 13.4*  --   --  11.7* 12.1*  NEUTROABS  --   --   --  4.5  --   HGB 11.1* 12.9 13.3 12.2 10.5*  HCT 37.3 38.0 39.0 39.4 35.0*  MCV 89.9  --   --  87.6 90.7  PLT 272  --   --  263 239   CMP: Recent Labs  Lab 03/17/24 1240 03/18/24 0706 03/21/24 2151 03/21/24 2200 03/21/24 2204 03/23/24 0619 03/24/24 0547  NA 136 136 134* 133* 134* 131* 135  K 4.2 4.4 5.3* 5.3* 5.4* 3.8 4.3  CL 98 97* 94*  --  101 92* 98  CO2 23 22 21*  --   --  22 19*  GLUCOSE 72 127* 461*  --  438* 114* 338*  BUN 26* 28* 42*  --  42* 26* 32*  CREATININE 5.13* 6.34* 7.74*  --  7.50* 5.41* 6.33*  CALCIUM  8.5* 8.5* 8.6*  --   --  8.1*  8.0*   GFR: Estimated Creatinine Clearance: 10.7 mL/min (A) (by C-G formula based on SCr of 6.33 mg/dL (H)). Recent Labs  Lab 03/21/24 2151  AST 93*  ALT 31  ALKPHOS 745*  BILITOT 1.2  PROT 5.9*  ALBUMIN  3.1*   No results for input(s): LIPASE, AMYLASE in the last 168 hours. No results for input(s): AMMONIA in the last 168 hours. Coagulation Profile: No results for input(s): INR, PROTIME in the last 168 hours. Unresulted Labs (From admission, onward)     Start     Ordered   03/24/24 0500  Basic metabolic panel with GFR  Daily,   R      03/23/24 1655   03/24/24 0500  CBC  Daily,   R      03/23/24 1655  03/21/24 1810  Urinalysis, Routine w reflex microscopic -Urine, Clean Catch  Once,   URGENT       Question Answer Comment  Obtain urine by in and out catheter if not obtained within 30 minutes of placing in a treatment room? Yes   Specimen Source Urine, Clean Catch      03/21/24 1809   Signed and Held  Renal function panel  Once,   R        Signed and Held   Signed and Held  CBC  Once,   R        Signed and Held           Antimicrobials/Microbiology: Anti-infectives (From admission, onward)    Start     Dose/Rate Route Frequency Ordered Stop   03/24/24 1800  vancomycin  (VANCOREADY) IVPB 750 mg/150 mL  Status:  Discontinued        750 mg 150 mL/hr over 60 Minutes Intravenous Every T-Th-Sa (1800) 03/23/24 1558 03/24/24 1126   03/24/24 1200  vancomycin  (VANCOREADY) IVPB 750 mg/150 mL  Status:  Discontinued        750 mg 150 mL/hr over 60 Minutes Intravenous Every T-Th-Sa (Hemodialysis) 03/23/24 0143 03/23/24 1128   03/23/24 0300  ceFEPIme  (MAXIPIME ) 1 g in sodium chloride  0.9 % 100 mL IVPB  Status:  Discontinued        1 g 200 mL/hr over 30 Minutes Intravenous Every 24 hours 03/23/24 0132 03/24/24 0936   03/23/24 0230  vancomycin  (VANCOREADY) IVPB 1500 mg/300 mL        1,500 mg 150 mL/hr over 120 Minutes Intravenous  Once 03/23/24 0143 03/23/24 0422   03/22/24  1000  cefTRIAXone  (ROCEPHIN ) 2 g in sodium chloride  0.9 % 100 mL IVPB  Status:  Discontinued        2 g 200 mL/hr over 30 Minutes Intravenous Every 24 hours 03/22/24 0317 03/23/24 0132   03/21/24 2315  cefTRIAXone  (ROCEPHIN ) 1 g in sodium chloride  0.9 % 100 mL IVPB        1 g 200 mL/hr over 30 Minutes Intravenous  Once 03/21/24 2304 03/22/24 0114   03/21/24 2315  azithromycin  (ZITHROMAX ) 500 mg in sodium chloride  0.9 % 250 mL IVPB        500 mg 250 mL/hr over 60 Minutes Intravenous  Once 03/21/24 2304 03/22/24 0224         Component Value Date/Time   SDES PERITONEAL 03/23/2024 1414   SPECREQUEST NONE 03/23/2024 1414   CULT  03/23/2024 1414    NO GROWTH < 24 HOURS Performed at [facility], [address], [place], [place] [address]    REPTSTATUS PENDING 03/23/2024 1414   Procedures:  Mennie LAMY, MD [facility] 03/24/2024, 11:27 AM

## 2024-03-24 NOTE — Consult Note (Signed)
 [facility]    Date of Admission:  03/21/2024     Reason for Consult: abnormal lab value    Referring Provider: CHRISTOBAL Guadalajara     Lines:  Peripheral iv RUE avg   Abx: 11/23-c vanc  11/23-24 cefepime  11/22-23 ceftriaxone         Assessment: 33 yo female dm1, esrd, recurrent ascites, id called for a gram stain of peritoneal fluid gpc  Patient has abdominal discomfort just before each tap; she underwent 6 liters tap 11/24. A gpc was called on gs  She has no sepsis. Her abd discomfort improved immediately after tap  She never seen gi previously for ascites  She has an egd early 01/2024 with hematemesis/dka and esophagitis was found   I personally went down to micro today and reviewed the slide and there was no gpc -- just artifact  The body fluid cell count was 198 with only 4 % neutrophil      Plan: No sign/sx for secondary bacterial peritonitis; history and lab not consistent with a bacterial peritonitis; review of gram stain showed artifact only Stop vancomycin  Ok to discharge from id standpoint Consider outpatient gi referral for ascites workup Will sign off Discuss with primary team      ------------------------------------------------ Principal Problem:   Acute on chronic diastolic heart failure (HCC) Active Problems:   Type 1 diabetes mellitus with hyperglycemia (HCC)   Diabetes mellitus type I (HCC)   ESRD on hemodialysis (HCC)   Prolonged QT interval   Bipolar disorder (HCC)   HFrEF (heart failure with reduced ejection fraction) (HCC)   CAP (community acquired pneumonia)    HPI: Ashley Freeman is a 33 y.o. female dm1, esrd, recurrent ascites, id called for a gram stain of peritoneal fluid gpc  Patient said she came to ED 11/22 as she was going to miss outpatient dialysis as she couldn't arrange transportation and so she called ambulance to go to hospital  She has had recurrent ascites.  Patient has abdominal discomfort just before each tap; she underwent 6 liters tap 11/24. A gpc was called on gs  She had several tap since 07/2023  She has no sepsis. Her abd discomfort improved immediately after tap  She never seen gi previously for ascites  She has an egd early 01/2024 with hematemesis/dka and esophagitis was found   I personally went down to micro today and reviewed the slide and there was no gpc -- just artifact  The body fluid cell count was 198 with only 4 % neutrophil    Family History  Problem Relation Age of Onset   Asthma Mother    Carpal tunnel syndrome Mother    Gout Father    Diabetes Paternal Grandmother    Anesthesia problems Neg Hx     Social History   Tobacco Use   Smoking status: Never    Passive exposure: Never   Smokeless tobacco: Never  Vaping Use   Vaping status: Never Used  Substance Use Topics   Alcohol  use: Not Currently   Drug use: No    Allergies  Allergen Reactions   Keflex  [Cephalexin ] Anaphylaxis    Ceftriaxone  in the past with no reaction   Penicillins Anaphylaxis, Hives and Rash   Vibramycin  [Doxycycline ] Anaphylaxis   Benadryl  [Diphenhydramine ] Itching   Dilaudid  [Hydromorphone ] Itching   Methotrexate And Trimetrexate Rash   Roxicodone  [Oxycodone ] Itching    Takes Percocet without issue    Review  of Systems: ROS All Other ROS was negative, except mentioned above   Past Medical History:  Diagnosis Date   Abscess, gluteal, right 08/24/2013   Anemia 02/19/2012   Bartholin's gland abscess 09/19/2013   Bipolar disorder (HCC)    BV (bacterial vaginosis) 11/24/2015   Depression    Diabetes mellitus type I (HCC) 2001   Diagnosed at age 33 ; Type I   Diarrhea 05/30/2016   DKA (diabetic ketoacidoses) 08/19/2013   Also in 2018   ESRD (end stage renal disease) (HCC)    Gonorrhea 08/2011   Treated in 09/2011   HFrEF (heart failure with reduced ejection fraction) (HCC)    a. 2022 Echo: EF 40%; b. 10/2021 Echo:  EF 55%; b. 07/2022 MV: No ischemia. EF 31%; c. 08/2022 Echo: EF 35%, mildly dil RV, sev TR.   History of trichomoniasis 05/31/2016   Hyperlipidemia 03/28/2016   Hypertension    NICM (nonischemic cardiomyopathy) (HCC)    Sepsis (HCC) 09/19/2013       Scheduled Meds:  bumetanide  (BUMEX ) IV  1 mg Intravenous Q12H   carvedilol   25 mg Oral BID WC   Chlorhexidine  Gluconate Cloth  6 each Topical Q0600   Chlorhexidine  Gluconate Cloth  6 each Topical Q0600   DULoxetine   20 mg Oral Daily   fluticasone   2 spray Each Nare Daily   heparin   2,500 Units Dialysis Once in dialysis   heparin   5,000 Units Subcutaneous Q8H   insulin  aspart  0-6 Units Subcutaneous TID WC   insulin  glargine-yfgn  5 Units Subcutaneous Q24H   lamoTRIgine   200 mg Oral Daily   OLANZapine  zydis  5 mg Oral QHS   pantoprazole   40 mg Oral BID   sacubitril -valsartan   1 tablet Oral BID   sevelamer  carbonate  800 mg Oral TID WC   sodium bicarbonate   650 mg Oral BID   Continuous Infusions:  anticoagulant sodium citrate      PRN Meds:.acetaminophen  **OR** acetaminophen , albuterol , anticoagulant sodium citrate , feeding supplement (NEPRO CARB STEADY), heparin , heparin , hydrOXYzine , lidocaine  (PF), lidocaine -prilocaine , morphine  injection, pentafluoroprop-tetrafluoroeth, prochlorperazine    OBJECTIVE: Blood pressure 131/70, pulse 77, temperature 98 F (36.7 C), temperature source Oral, resp. rate 16, height 5' 3 (1.6 m), weight 61.5 kg, SpO2 97%.  Physical Exam  General/constitutional: no distress, pleasant HEENT: Normocephalic, PER, Conj Clear, EOMI, Oropharynx clear Neck supple CV: rrr no mrg Lungs: clear to auscultation, normal respiratory effort Abd: Soft, Nontender Ext: no edema Skin: No Rash Neuro: nonfocal MSK: no peripheral joint swelling/tenderness/warmth; back spines nontender    Lab Results Lab Results  Component Value Date   WBC 12.1 (H) 03/24/2024   HGB 10.5 (L) 03/24/2024   HCT 35.0 (L) 03/24/2024    MCV 90.7 03/24/2024   PLT 239 03/24/2024    Lab Results  Component Value Date   CREATININE 6.33 (H) 03/24/2024   BUN 32 (H) 03/24/2024   NA 135 03/24/2024   K 4.3 03/24/2024   CL 98 03/24/2024   CO2 19 (L) 03/24/2024    Lab Results  Component Value Date   ALT 31 03/21/2024   AST 93 (H) 03/21/2024   ALKPHOS 745 (H) 03/21/2024   BILITOT 1.2 03/21/2024      Microbiology: Recent Results (from the past 240 hours)  Resp panel by RT-PCR (RSV, Flu A&B, Covid) Anterior Nasal Swab     Status: None   Collection Time: 03/22/24  3:31 AM   Specimen: Anterior Nasal Swab  Result Value Ref Range Status   SARS Coronavirus  2 by RT PCR NEGATIVE NEGATIVE Final   Influenza A by PCR NEGATIVE NEGATIVE Final   Influenza B by PCR NEGATIVE NEGATIVE Final    Comment: (NOTE) The Xpert Xpress SARS-CoV-2/FLU/RSV plus assay is intended as an aid in the diagnosis of influenza from Nasopharyngeal swab specimens and should not be used as a sole basis for treatment. Nasal washings and aspirates are unacceptable for Xpert Xpress SARS-CoV-2/FLU/RSV testing.  Fact Sheet for Patients: bloggercourse.com  Fact Sheet for Healthcare Providers: seriousbroker.it  This test is not yet approved or cleared by the [place]  FDA and has been authorized for detection and/or diagnosis of SARS-CoV-2 by FDA under an Emergency Use Authorization (EUA). This EUA will remain in effect (meaning this test can be used) for the duration of the COVID-19 declaration under Section 564(b)(1) of the Act, 21 U.S.C. section 360bbb-3(b)(1), unless the authorization is terminated or revoked.     Resp Syncytial Virus by PCR NEGATIVE NEGATIVE Final    Comment: (NOTE) Fact Sheet for Patients: bloggercourse.com  Fact Sheet for Healthcare Providers: seriousbroker.it  This test is not yet approved or cleared by the [place]  FDA  and has been authorized for detection and/or diagnosis of SARS-CoV-2 by FDA under an Emergency Use Authorization (EUA). This EUA will remain in effect (meaning this test can be used) for the duration of the COVID-19 declaration under Section 564(b)(1) of the Act, 21 U.S.C. section 360bbb-3(b)(1), unless the authorization is terminated or revoked.  Performed at [facility], [address], [place], [place] [address]   MRSA Next Gen by PCR, Nasal     Status: None   Collection Time: 03/23/24  1:26 AM   Specimen: Nasal Mucosa; Nasal Swab  Result Value Ref Range Status   MRSA by PCR Next Gen NOT DETECTED NOT DETECTED Final    Comment: (NOTE) The GeneXpert MRSA Assay (FDA approved for NASAL specimens only), is one component of a comprehensive MRSA colonization surveillance program. It is not intended to diagnose MRSA infection nor to guide or monitor treatment for MRSA infections. Test performance is not FDA approved in patients less than 37 years old. Performed at [facility], [address], [place], [place] [address]   Gram stain     Status: None   Collection Time: 03/23/24  1:37 PM   Specimen: Abdomen; Peritoneal Fluid  Result Value Ref Range Status   Specimen Description PERITONEAL  Final   Special Requests NONE  Final   Gram Stain   Final    WBC PRESENT, PREDOMINANTLY MONONUCLEAR GRAM POSITIVE COCCI CRITICAL RESULT CALLED TO, READ BACK BY AND VERIFIED WITH: RN TIM F ON 03/23/24 @ 1536 BY DRT Performed at [facility], [address], [place], [place] [address]    Report Status 03/23/2024 FINAL  Final  Culture, body fluid w Gram Stain-bottle     Status: None (Preliminary result)   Collection Time: 03/23/24  2:14 PM   Specimen: Peritoneal Washings  Result Value Ref Range Status   Specimen Description PERITONEAL  Final   Special Requests NONE  Final   Gram Stain PENDING  Incomplete   Culture   Final    NO GROWTH < 24 HOURS Performed at [facility], [address] [place], [place] [address]    Report Status PENDING  Incomplete     Serology:    Imaging: If present, new imagings (plain films, ct scans, and mri) have been personally visualized and interpreted; radiology reports  have been reviewed. Decision making incorporated into the Impression / Recommendations.  03/22/24 ct abd pelv without contrast 1. Moderate to severe ascites, increased compared to the prior exam. 2. Moderate right pleural effusion, slightly increased compared to the prior exam.  Constance ONEIDA Passer, MD [facility] [phone] pager    03/24/2024, 11:42 AM

## 2024-03-24 NOTE — Progress Notes (Signed)
 Patient ID: ARMIDA VICKROY, female   DOB: [birth year], 33 y.o.   MRN: [id number] S: Had large volume paracentesis of 6.2 liters yesterday.  Feeling better this morning. O:BP 131/70 (BP Location: Left Arm)   Pulse 77   Temp 98 F (36.7 C) (Oral)   Resp 16   Ht 5' 3 (1.6 m)   Wt 61.5 kg   SpO2 97%   BMI 24.02 kg/m   Intake/Output Summary (Last 24 hours) at 03/24/2024 0952 Last data filed at 03/24/2024 0417 Gross per 24 hour  Intake 681.79 ml  Output --  Net 681.79 ml   Intake/Output: I/O last 3 completed shifts: In: 681.8 [P.O.:540; IV Piggyback:141.8] Out: -   Intake/Output this shift:  No intake/output data recorded. Weight change: -7.5 kg Gen:NAD CVS: RRR Resp: decreased BS at bases Abd: distended, +BS, soft, + fluid wave Ext: no edema, RUE AVG +T/B   Recent Labs  Lab 03/17/24 1031 03/17/24 1240 03/18/24 0706 03/21/24 2151 03/21/24 2200 03/21/24 2204 03/23/24 0619 03/24/24 0547  NA 134* 136 136 134* 133* 134* 131* 135  K 3.6 4.2 4.4 5.3* 5.3* 5.4* 3.8 4.3  CL 94* 98 97* 94*  --  101 92* 98  CO2 20* 23 22 21*  --   --  22 19*  GLUCOSE 111* 72 127* 461*  --  438* 114* 338*  BUN 26* 26* 28* 42*  --  42* 26* 32*  CREATININE 4.55* 5.13* 6.34* 7.74*  --  7.50* 5.41* 6.33*  ALBUMIN   --   --   --  3.1*  --   --   --   --   CALCIUM  8.7* 8.5* 8.5* 8.6*  --   --  8.1* 8.0*  AST  --   --   --  93*  --   --   --   --   ALT  --   --   --  31  --   --   --   --    Liver Function Tests: Recent Labs  Lab 03/21/24 2151  AST 93*  ALT 31  ALKPHOS 745*  BILITOT 1.2  PROT 5.9*  ALBUMIN  3.1*   No results for input(s): LIPASE, AMYLASE in the last 168 hours. No results for input(s): AMMONIA in the last 168 hours. CBC: Recent Labs  Lab 03/21/24 2151 03/21/24 2200 03/21/24 2204 03/23/24 0619 03/24/24 0547  WBC 13.4*  --   --  11.7* 12.1*  NEUTROABS  --   --   --  4.5  --   HGB 11.1*   < > 13.3 12.2 10.5*  HCT 37.3   < > 39.0 39.4 35.0*  MCV 89.9   --   --  87.6 90.7  PLT 272  --   --  263 239   < > = values in this interval not displayed.   Cardiac Enzymes: No results for input(s): CKTOTAL, CKMB, CKMBINDEX, TROPONINI in the last 168 hours. CBG: Recent Labs  Lab 03/23/24 1544 03/23/24 2049 03/23/24 2204 03/24/24 0343 03/24/24 0624  GLUCAP 130* 269* 305* 363* 318*    Iron  Studies: No results for input(s): IRON , TIBC, TRANSFERRIN, FERRITIN in the last 72 hours. Studies/Results: IR Paracentesis Result Date: 03/23/2024 INDICATION: History of end-stage renal disease on hemodialysis with recurrent ascites. Request for therapeutic paracentesis. EXAM: ULTRASOUND GUIDED THERAPEUTIC PARACENTESIS MEDICATIONS: 8 mL 1% lidocaine  COMPLICATIONS: None immediate. PROCEDURE: Informed written consent was obtained from the patient after a discussion of the risks, benefits and alternatives  to treatment. A timeout was performed prior to the initiation of the procedure. Initial ultrasound scanning demonstrates a moderate amount of ascites within the right lower abdominal quadrant. The right lower abdomen was prepped and draped in the usual sterile fashion. 1% lidocaine  was used for local anesthesia. Following this, a 19 gauge, 7-cm, Yueh catheter was introduced. An ultrasound image was saved for documentation purposes. The paracentesis was performed. The catheter was removed and a dressing was applied. The patient tolerated the procedure well without immediate post procedural complication. Patient received post-procedure intravenous albumin ; see nursing notes for details. FINDINGS: A total of approximately 6.2 liters of hazy yellow fluid was removed. IMPRESSION: Successful ultrasound-guided paracentesis yielding 6.2 liters of peritoneal fluid. Performed by: Wyatt Pommier, PA-C Electronically Signed   By: JONETTA Faes M.D.   On: 03/23/2024 16:25   DG Chest Port 1 View Result Date: 03/23/2024 EXAM: 1 VIEW(S) XRAY OF THE CHEST 03/23/2024 12:30:00  AM COMPARISON: 03/21/2024. CLINICAL HISTORY: SOB (shortness of breath). FINDINGS: LUNGS AND PLEURA: Worsening bilaterally airspace disease could reflect edema or infection. Suspect small right pleural effusion. Low lung volumes. No pneumothorax. HEART AND MEDIASTINUM: Cardiomegaly. BONES AND SOFT TISSUES: No acute osseous abnormality. IMPRESSION: 1. Worsening bilateral airspace disease, possibly reflecting edema or infection. 2. Suspected small right pleural effusion. 3. Cardiomegaly. Electronically signed by: Franky Crease MD 03/23/2024 12:42 AM EST RP Workstation: [id number]    bumetanide  (BUMEX ) IV  1 mg Intravenous Q12H   carvedilol   25 mg Oral BID WC   Chlorhexidine  Gluconate Cloth  6 each Topical Q0600   Chlorhexidine  Gluconate Cloth  6 each Topical Q0600   DULoxetine   20 mg Oral Daily   fluticasone   2 spray Each Nare Daily   heparin   2,500 Units Dialysis Once in dialysis   heparin   5,000 Units Subcutaneous Q8H   insulin  aspart  0-6 Units Subcutaneous TID WC   insulin  glargine-yfgn  5 Units Subcutaneous Q24H   lamoTRIgine   200 mg Oral Daily   OLANZapine  zydis  5 mg Oral QHS   pantoprazole   40 mg Oral BID   sacubitril -valsartan   1 tablet Oral BID   sevelamer  carbonate  800 mg Oral TID WC   sodium bicarbonate   650 mg Oral BID    BMET    Component Value Date/Time   NA 135 03/24/2024 0547   NA 136 07/13/2021 1553   NA 137 11/12/2013 0423   K 4.3 03/24/2024 0547   K 4.2 11/12/2013 0423   CL 98 03/24/2024 0547   CL 107 11/12/2013 0423   CO2 19 (L) 03/24/2024 0547   CO2 23 11/12/2013 0423   GLUCOSE 338 (H) 03/24/2024 0547   GLUCOSE 339 (H) 11/12/2013 0423   BUN 32 (H) 03/24/2024 0547   BUN 51 (H) 07/13/2021 1553   BUN 7 11/12/2013 0423   CREATININE 6.33 (H) 03/24/2024 0547   CREATININE 0.59 (L) 11/12/2013 0423   CREATININE 0.51 08/24/2013 0957   CALCIUM  8.0 (L) 03/24/2024 0547   CALCIUM  8.2 (L) 11/12/2013 0423   GFRNONAA 8 (L) 03/24/2024 0547   GFRNONAA >60 11/12/2013 0423    GFRNONAA >89 08/24/2013 0957   GFRAA 44 (L) 01/30/2020 1637   GFRAA >60 11/12/2013 0423   GFRAA >89 08/24/2013 0957   CBC    Component Value Date/Time   WBC 12.1 (H) 03/24/2024 0547   RBC 3.86 (L) 03/24/2024 0547   HGB 10.5 (L) 03/24/2024 0547   HGB 10.0 (L) 09/08/2019 0940   HCT 35.0 (L) 03/24/2024 9452  HCT 30.3 (L) 09/08/2019 0940   PLT 239 03/24/2024 0547   PLT 302 09/08/2019 0940   MCV 90.7 03/24/2024 0547   MCV 87 09/08/2019 0940   MCV 80 11/11/2013 0431   MCH 27.2 03/24/2024 0547   MCHC 30.0 03/24/2024 0547   RDW 16.3 (H) 03/24/2024 0547   RDW 13.5 09/08/2019 0940   RDW 14.8 (H) 11/11/2013 0431   LYMPHSABS 4.8 (H) 03/23/2024 0619   LYMPHSABS 2.0 05/20/2019 1232   LYMPHSABS 3.7 (H) 11/11/2013 0431   MONOABS 1.2 (H) 03/23/2024 0619   MONOABS 0.5 11/11/2013 0431   EOSABS 1.0 (H) 03/23/2024 0619   EOSABS 0.4 05/20/2019 1232   EOSABS 0.1 11/11/2013 0431   BASOSABS 0.1 03/23/2024 0619   BASOSABS 0.0 05/20/2019 1232   BASOSABS 0.1 11/11/2013 0431    OP HD: [facility] TTS AVG  Heparin  1600 +600/hr   CXR 11/23: no pulm edema/ vasc congestion, poss RLL pna     Assessment/ Plan: ESRD: on HD TTS. HD off schedule and had HD yesterday. HD today then follow up with her outpatient clinic tomorrow for holiday schedule.   Acute on chronic systolic CHF - moderate to severe ascites.  Status post large volume paracentesis yesterday by IR. Possible CAP - per primary svc BP: bp's are low normal. Cont meds as tolerated.  Ascites: for US  guided paracentesis yesterday by IR.  CXR clear of edema. Will follow.  Anemia of esrd: Hb 10.5-11 here. Follow.  Abdominal pain: w/u in progress, hx of ascites T1DM - had hypoglycemic event last night but better this morning. Disposition - possible discharge to home today after hemodialysis.    Fairy RONAL Sellar, MD [facility]

## 2024-03-25 NOTE — Progress Notes (Signed)
 Late Note Entry- March 25, 2024  D/C summary and last renal note faxed to [facility] for continuation of care.   Randine Mungo Dialysis Navigator [phone]

## 2024-03-28 ENCOUNTER — Observation Stay (HOSPITAL_COMMUNITY)
Admission: EM | Admit: 2024-03-28 | Discharge: 2024-03-30 | Disposition: A | Attending: Internal Medicine | Admitting: Internal Medicine

## 2024-03-28 ENCOUNTER — Emergency Department (HOSPITAL_COMMUNITY)

## 2024-03-28 ENCOUNTER — Encounter (HOSPITAL_COMMUNITY): Payer: Self-pay

## 2024-03-28 ENCOUNTER — Other Ambulatory Visit: Payer: Self-pay

## 2024-03-28 DIAGNOSIS — D72829 Elevated white blood cell count, unspecified: Principal | ICD-10-CM

## 2024-03-28 DIAGNOSIS — R6510 Systemic inflammatory response syndrome (SIRS) of non-infectious origin without acute organ dysfunction: Secondary | ICD-10-CM | POA: Diagnosis present

## 2024-03-28 DIAGNOSIS — Z794 Long term (current) use of insulin: Secondary | ICD-10-CM | POA: Insufficient documentation

## 2024-03-28 DIAGNOSIS — Z992 Dependence on renal dialysis: Secondary | ICD-10-CM | POA: Insufficient documentation

## 2024-03-28 DIAGNOSIS — I5023 Acute on chronic systolic (congestive) heart failure: Secondary | ICD-10-CM | POA: Diagnosis present

## 2024-03-28 DIAGNOSIS — E10649 Type 1 diabetes mellitus with hypoglycemia without coma: Principal | ICD-10-CM | POA: Insufficient documentation

## 2024-03-28 DIAGNOSIS — N186 End stage renal disease: Secondary | ICD-10-CM

## 2024-03-28 DIAGNOSIS — R7989 Other specified abnormal findings of blood chemistry: Secondary | ICD-10-CM | POA: Diagnosis present

## 2024-03-28 DIAGNOSIS — F319 Bipolar disorder, unspecified: Secondary | ICD-10-CM | POA: Diagnosis present

## 2024-03-28 DIAGNOSIS — E1022 Type 1 diabetes mellitus with diabetic chronic kidney disease: Secondary | ICD-10-CM

## 2024-03-28 DIAGNOSIS — R188 Other ascites: Secondary | ICD-10-CM | POA: Insufficient documentation

## 2024-03-28 DIAGNOSIS — E162 Hypoglycemia, unspecified: Secondary | ICD-10-CM | POA: Diagnosis present

## 2024-03-28 DIAGNOSIS — I132 Hypertensive heart and chronic kidney disease with heart failure and with stage 5 chronic kidney disease, or end stage renal disease: Secondary | ICD-10-CM | POA: Insufficient documentation

## 2024-03-28 DIAGNOSIS — Z79899 Other long term (current) drug therapy: Secondary | ICD-10-CM | POA: Insufficient documentation

## 2024-03-28 LAB — CBC WITH DIFFERENTIAL/PLATELET
Abs Immature Granulocytes: 0.16 K/uL — ABNORMAL HIGH (ref 0.00–0.07)
Basophils Absolute: 0.1 K/uL (ref 0.0–0.1)
Basophils Relative: 1 %
Eosinophils Absolute: 0.8 K/uL — ABNORMAL HIGH (ref 0.0–0.5)
Eosinophils Relative: 4 %
HCT: 33.5 % — ABNORMAL LOW (ref 36.0–46.0)
Hemoglobin: 9.8 g/dL — ABNORMAL LOW (ref 12.0–15.0)
Immature Granulocytes: 1 %
Lymphocytes Relative: 15 %
Lymphs Abs: 3.3 K/uL (ref 0.7–4.0)
MCH: 26.8 pg (ref 26.0–34.0)
MCHC: 29.3 g/dL — ABNORMAL LOW (ref 30.0–36.0)
MCV: 91.8 fL (ref 80.0–100.0)
Monocytes Absolute: 0.8 K/uL (ref 0.1–1.0)
Monocytes Relative: 3 %
Neutro Abs: 17 K/uL — ABNORMAL HIGH (ref 1.7–7.7)
Neutrophils Relative %: 76 %
Platelets: 181 K/uL (ref 150–400)
RBC: 3.65 MIL/uL — ABNORMAL LOW (ref 3.87–5.11)
RDW: 15.9 % — ABNORMAL HIGH (ref 11.5–15.5)
WBC: 22.2 K/uL — ABNORMAL HIGH (ref 4.0–10.5)
nRBC: 0 % (ref 0.0–0.2)

## 2024-03-28 LAB — COMPREHENSIVE METABOLIC PANEL WITH GFR
ALT: 27 U/L (ref 0–44)
AST: 120 U/L — ABNORMAL HIGH (ref 15–41)
Albumin: 2.6 g/dL — ABNORMAL LOW (ref 3.5–5.0)
Alkaline Phosphatase: 365 U/L — ABNORMAL HIGH (ref 38–126)
Anion gap: 12 (ref 5–15)
BUN: 36 mg/dL — ABNORMAL HIGH (ref 6–20)
CO2: 23 mmol/L (ref 22–32)
Calcium: 8.1 mg/dL — ABNORMAL LOW (ref 8.9–10.3)
Chloride: 106 mmol/L (ref 98–111)
Creatinine, Ser: 7.71 mg/dL — ABNORMAL HIGH (ref 0.44–1.00)
GFR, Estimated: 7 mL/min — ABNORMAL LOW (ref >60)
Glucose, Bld: 247 mg/dL — ABNORMAL HIGH (ref 70–99)
Potassium: 5.1 mmol/L (ref 3.5–5.1)
Sodium: 141 mmol/L (ref 135–145)
Total Bilirubin: 1 mg/dL (ref 0.0–1.2)
Total Protein: 4.9 g/dL — ABNORMAL LOW (ref 6.5–8.1)

## 2024-03-28 LAB — CBG MONITORING, ED
Glucose-Capillary: 171 mg/dL — ABNORMAL HIGH (ref 70–99)
Glucose-Capillary: 198 mg/dL — ABNORMAL HIGH (ref 70–99)
Glucose-Capillary: 253 mg/dL — ABNORMAL HIGH (ref 70–99)

## 2024-03-28 LAB — CULTURE, BODY FLUID W GRAM STAIN -BOTTLE

## 2024-03-28 LAB — HCG, SERUM, QUALITATIVE: Preg, Serum: NEGATIVE

## 2024-03-28 MED ORDER — MORPHINE SULFATE (PF) 4 MG/ML IV SOLN
4.0000 mg | Freq: Once | INTRAVENOUS | Status: AC
  Administered 2024-03-28: 4 mg via INTRAVENOUS
  Filled 2024-03-28: qty 1

## 2024-03-28 NOTE — ED Triage Notes (Addendum)
 Pt to ED via [facility] from home c/o hypoglycemia. Pt mother called EMS d/t cbg 45. On EMS arrival cbg 35, alert to painful stimulI on EMS arrival  1mg  glucagon  given.   Last dialysis Tuesday.   Bp 158/98, HR 104, 96%RA, RR 18 Last cbg 116   Pt A&O X 4 during triage.   Abdominal distention

## 2024-03-28 NOTE — ED Provider Notes (Incomplete)
 [place] EMERGENCY DEPARTMENT AT [facility] Provider Note   CSN: [id number] Arrival date & time: 03/28/24  2103     Patient presents with: Hypoglycemia   Ashley Freeman is a 33 y.o. female.  {Add pertinent medical, surgical, social history, OB history to YEP:67052} Patient to ED by EMS, called by family for hypoglycemic episode. The patient reports she is unsure if she took too much insulin  or if she didn't eat enough today, but around 8:00 pm she felt shaky. She drank some OJ but continued to have symptoms. She states her mother brought her a second glass but she does not remember drinking it. Per EMS, CBG on scene 35 with decreased GCS, better after giving glucagon  and arrived awake and alert. She denies fever, cough, vomiting. She missed dialysis this week due to the holidays. She reports pain all over now, which is how she feels when she misses her dialysis treatments. No SOB, CP.  The history is provided by the patient and the EMS personnel. No language interpreter was used.  Hypoglycemia      Prior to Admission medications   Medication Sig Start Date End Date Taking? Authorizing Provider  albuterol  (VENTOLIN  HFA) 108 (90 Base) MCG/ACT inhaler Inhale 2 puffs into the lungs every 4 (four) hours as needed for wheezing or shortness of breath. 11/24/22   [provider]  bumetanide  (BUMEX ) 2 MG tablet Take 10 mg by mouth daily.    [provider]  carvedilol  (COREG ) 25 MG tablet Take 25 mg by mouth 2 (two) times daily with a meal.    [provider]  DULoxetine  (CYMBALTA ) 20 MG capsule Take 20 mg by mouth daily. 07/29/23   [provider]  fluticasone  (FLONASE ) 50 MCG/ACT nasal spray Place 2 sprays into both nostrils daily. 12/11/23 12/10/24  [provider]  hydrOXYzine  (ATARAX ) 25 MG tablet Take 25 mg by mouth 3 (three) times daily as needed for anxiety, itching, nausea or vomiting.    [provider]   lamoTRIgine  (LAMICTAL ) 200 MG tablet Take 1 tablet (200 mg total) by mouth daily. 10/29/23   Regalado, Belkys A, MD  LANTUS  SOLOSTAR 100 UNIT/ML Solostar Pen Inject 10 Units into the skin daily. 02/07/24   Drusilla Sabas RAMAN, MD  metoCLOPramide  (REGLAN ) 10 MG tablet Take 0.5 tablets (5 mg total) by mouth every 8 (eight) hours as needed for nausea or refractory nausea / vomiting. 02/07/24   Drusilla Sabas RAMAN, MD  NOVOLOG  FLEXPEN 100 UNIT/ML FlexPen Inject 5 Units into the skin 3 (three) times daily with meals. 01/18/24   [provider]  OLANZapine  zydis (ZYPREXA ) 5 MG disintegrating tablet Take 5 mg by mouth at bedtime. 09/23/23 03/23/24  [provider]  pantoprazole  (PROTONIX ) 40 MG tablet Take 1 tablet (40 mg total) by mouth 2 (two) times daily. 02/07/24 04/10/24  Drusilla Sabas RAMAN, MD  prochlorperazine  (COMPAZINE ) 5 MG tablet Take 5 mg by mouth every 6 (six) hours as needed for nausea or vomiting.    [provider]  rosuvastatin  (CRESTOR ) 40 MG tablet Take 40 mg by mouth daily. 01/20/24 01/19/25  [provider]  sacubitril -valsartan  (ENTRESTO ) 24-26 MG Take 1 tablet by mouth 2 (two) times daily.    [provider]  sevelamer  carbonate (RENVELA ) 800 MG tablet Take 1 tablet (800 mg total) by mouth 3 (three) times daily with meals. 02/07/24   Drusilla Sabas RAMAN, MD  sodium bicarbonate  650 MG tablet Take 650 mg by mouth 2 (two) times  daily. 07/29/23   [provider]  sucralfate  (CARAFATE ) 1 GM/10ML suspension Take 10 mLs (1 g total) by mouth 2 (two) times daily for 14 days. 02/07/24 03/23/24  Drusilla Sabas RAMAN, MD  SUMAtriptan  (IMITREX ) 50 MG tablet Take 50 mg by mouth every 2 (two) hours as needed for migraine or headache. 06/20/22   [provider]    Allergies: Keflex  [cephalexin ], Penicillins, Vibramycin  [doxycycline ], Benadryl  [diphenhydramine ], Dilaudid  [hydromorphone ], Methotrexate and trimetrexate, and Roxicodone  [oxycodone ]    Review of  Systems  Updated Vital Signs BP (!) 166/100   Pulse (!) 105   Temp 98.1 F (36.7 C) (Oral)   Resp 20   Ht 5' 3 (1.6 m)   Wt 59.4 kg   SpO2 99%   BMI 23.21 kg/m   Physical Exam Vitals and nursing note reviewed.  Constitutional:      Appearance: She is well-developed.     Comments: Chronically ill appearing.  HENT:     Head: Normocephalic.  Cardiovascular:     Rate and Rhythm: Normal rate and regular rhythm.     Heart sounds: No murmur heard. Pulmonary:     Effort: Pulmonary effort is normal.     Breath sounds: Normal breath sounds. No wheezing, rhonchi or rales.  Abdominal:     General: Bowel sounds are normal.     Palpations: Abdomen is soft.     Tenderness: There is no abdominal tenderness. There is no guarding or rebound.  Musculoskeletal:        General: Normal range of motion.     Cervical back: Normal range of motion and neck supple.  Skin:    General: Skin is warm and dry.  Neurological:     General: No focal deficit present.     Mental Status: She is alert and oriented to person, place, and time.     (all labs ordered are listed, but only abnormal results are displayed) Labs Reviewed  CBG MONITORING, ED - Abnormal; Notable for the following components:      Result Value   Glucose-Capillary 171 (*)    All other components within normal limits  CBC WITH DIFFERENTIAL/PLATELET  COMPREHENSIVE METABOLIC PANEL WITH GFR  HCG, SERUM, QUALITATIVE  CBG MONITORING, ED    EKG: None  Radiology: No results found.  {Document cardiac monitor, telemetry assessment procedure when appropriate:32947} Procedures   Medications Ordered in the ED - No data to display  Clinical Course as of 03/28/24 2157  Sat Mar 28, 2024  2153 Patient with h/o T1DM, ESRD with TTS HD, HTN, chronic HFrEF (EF 30-35%), anemia of chronic renal disease, HLD, BPD, and recent admission for DKA 11/19, presents after hypoglycemic episode tonight with CBG on scene of 35. Arrives to ED awake and  alert. Complains of generalized pain she attributes to missing her dialysis treatments. VSS. IV team consulted to obtain IV access. Labs pending.  [SU]    Clinical Course User Index [SU] Odell Balls, PA-C   {Click here for ABCD2, HEART and other calculators REFRESH Note before signing:1}                              Medical Decision Making Amount and/or Complexity of Data Reviewed Labs: ordered.     {Document critical care time when appropriate  Document review of labs and clinical decision tools ie CHADS2VASC2, etc  Document your independent review of radiology images and any outside records  Document your discussion with  family members, caretakers and with consultants  Document social determinants of health affecting pt's care  Document your decision making why or why not admission, treatments were needed:32947:::1}   Final diagnoses:  None    ED Discharge Orders     None

## 2024-03-28 NOTE — ED Provider Notes (Signed)
 [place] EMERGENCY DEPARTMENT AT [facility] Provider Note   CSN: [id number] Arrival date & time: 03/28/24  2103     Patient presents with: Hypoglycemia   Ashley Freeman is a 33 y.o. female.   Patient to ED by EMS, called by family for hypoglycemic episode. The patient reports she is unsure if she took too much insulin  or if she didn't eat enough today, but around 8:00 pm she felt shaky. She drank some OJ but continued to have symptoms. She states her mother brought her a second glass but she does not remember drinking it. Per EMS, CBG on scene 35 with decreased GCS, better after giving glucagon  and arrived awake and alert. She denies fever, cough, vomiting. She missed dialysis this week due to the holidays. She reports pain all over now, which is how she feels when she misses her dialysis treatments. No SOB, CP.  The history is provided by the patient and the EMS personnel. No language interpreter was used.  Hypoglycemia      Prior to Admission medications   Medication Sig Start Date End Date Taking? Authorizing Provider  albuterol  (VENTOLIN  HFA) 108 (90 Base) MCG/ACT inhaler Inhale 2 puffs into the lungs every 4 (four) hours as needed for wheezing or shortness of breath. 11/24/22   [provider]  bumetanide  (BUMEX ) 2 MG tablet Take 10 mg by mouth daily.    [provider]  carvedilol  (COREG ) 25 MG tablet Take 25 mg by mouth 2 (two) times daily with a meal.    [provider]  DULoxetine  (CYMBALTA ) 20 MG capsule Take 20 mg by mouth daily. 07/29/23   [provider]  fluticasone  (FLONASE ) 50 MCG/ACT nasal spray Place 2 sprays into both nostrils daily. 12/11/23 12/10/24  [provider]  hydrOXYzine  (ATARAX ) 25 MG tablet Take 25 mg by mouth 3 (three) times daily as needed for anxiety, itching, nausea or vomiting.    [provider]  lamoTRIgine  (LAMICTAL ) 200 MG tablet Take 1 tablet (200 mg total) by mouth  daily. 10/29/23   Regalado, Belkys A, MD  LANTUS  SOLOSTAR 100 UNIT/ML Solostar Pen Inject 10 Units into the skin daily. 02/07/24   Drusilla Sabas RAMAN, MD  metoCLOPramide  (REGLAN ) 10 MG tablet Take 0.5 tablets (5 mg total) by mouth every 8 (eight) hours as needed for nausea or refractory nausea / vomiting. 02/07/24   Drusilla Sabas RAMAN, MD  NOVOLOG  FLEXPEN 100 UNIT/ML FlexPen Inject 5 Units into the skin 3 (three) times daily with meals. 01/18/24   [provider]  OLANZapine  zydis (ZYPREXA ) 5 MG disintegrating tablet Take 5 mg by mouth at bedtime. 09/23/23 03/23/24  [provider]  pantoprazole  (PROTONIX ) 40 MG tablet Take 1 tablet (40 mg total) by mouth 2 (two) times daily. 02/07/24 04/10/24  Drusilla Sabas RAMAN, MD  prochlorperazine  (COMPAZINE ) 5 MG tablet Take 5 mg by mouth every 6 (six) hours as needed for nausea or vomiting.    [provider]  rosuvastatin  (CRESTOR ) 40 MG tablet Take 40 mg by mouth daily. 01/20/24 01/19/25  [provider]  sacubitril -valsartan  (ENTRESTO ) 24-26 MG Take 1 tablet by mouth 2 (two) times daily.    [provider]  sevelamer  carbonate (RENVELA ) 800 MG tablet Take 1 tablet (800 mg total) by mouth 3 (three) times daily with meals. 02/07/24   Drusilla Sabas RAMAN, MD  sodium bicarbonate  650 MG tablet Take 650 mg by mouth 2 (two) times daily. 07/29/23   [provider]  sucralfate  (  CARAFATE ) 1 GM/10ML suspension Take 10 mLs (1 g total) by mouth 2 (two) times daily for 14 days. 02/07/24 03/23/24  Drusilla Sabas RAMAN, MD  SUMAtriptan  (IMITREX ) 50 MG tablet Take 50 mg by mouth every 2 (two) hours as needed for migraine or headache. 06/20/22   [provider]    Allergies: Keflex  [cephalexin ], Penicillins, Vibramycin  [doxycycline ], Benadryl  [diphenhydramine ], Dilaudid  [hydromorphone ], Methotrexate and trimetrexate, and Roxicodone  [oxycodone ]    Review of Systems  Updated Vital Signs BP (!) 166/100   Pulse (!) 105   Temp 98.1 F (36.7 C)  (Oral)   Resp 20   Ht 5' 3 (1.6 m)   Wt 59.4 kg   SpO2 99%   BMI 23.21 kg/m   Physical Exam Vitals and nursing note reviewed.  Constitutional:      Appearance: She is well-developed.     Comments: Chronically ill appearing.  HENT:     Head: Normocephalic.  Cardiovascular:     Rate and Rhythm: Normal rate and regular rhythm.     Heart sounds: No murmur heard. Pulmonary:     Effort: Pulmonary effort is normal.     Breath sounds: Normal breath sounds. No wheezing, rhonchi or rales.  Abdominal:     General: Bowel sounds are normal.     Palpations: Abdomen is soft.     Tenderness: There is no abdominal tenderness. There is no guarding or rebound.  Musculoskeletal:        General: Normal range of motion.     Cervical back: Normal range of motion and neck supple.  Skin:    General: Skin is warm and dry.  Neurological:     General: No focal deficit present.     Mental Status: She is alert and oriented to person, place, and time.     (all labs ordered are listed, but only abnormal results are displayed) Labs Reviewed  CBC WITH DIFFERENTIAL/PLATELET - Abnormal; Notable for the following components:      Result Value   WBC 22.2 (*)    RBC 3.65 (*)    Hemoglobin 9.8 (*)    HCT 33.5 (*)    MCHC 29.3 (*)    RDW 15.9 (*)    Neutro Abs 17.0 (*)    Eosinophils Absolute 0.8 (*)    Abs Immature Granulocytes 0.16 (*)    All other components within normal limits  COMPREHENSIVE METABOLIC PANEL WITH GFR - Abnormal; Notable for the following components:   Glucose, Bld 247 (*)    BUN 36 (*)    Creatinine, Ser 7.71 (*)    Calcium  8.1 (*)    Total Protein 4.9 (*)    Albumin  2.6 (*)    AST 120 (*)    Alkaline Phosphatase 365 (*)    GFR, Estimated 7 (*)    All other components within normal limits  CBG MONITORING, ED - Abnormal; Notable for the following components:   Glucose-Capillary 171 (*)    All other components within normal limits  CBG MONITORING, ED - Abnormal; Notable for  the following components:   Glucose-Capillary 253 (*)    All other components within normal limits  CBG MONITORING, ED - Abnormal; Notable for the following components:   Glucose-Capillary 198 (*)    All other components within normal limits  CULTURE, BLOOD (ROUTINE X 2)  CULTURE, BLOOD (ROUTINE X 2)  HCG, SERUM, QUALITATIVE  I-STAT CG4 LACTIC ACID, ED  CBG MONITORING, ED  CBG MONITORING, ED    EKG: EKG Interpretation  Date/Time:  Saturday March 28 2024 21:24:44 EST Ventricular Rate:  105 PR Interval:  107 QRS Duration:  83 QT Interval:  373 QTC Calculation: 493 R Axis:   76  Text Interpretation: Sinus tachycardia Borderline repol abnormality, diffuse leads Confirmed by Cottie Cough [phone]) on 03/28/2024 10:04:13 PM  Radiology: [facility] Chest Portable 1 View Result Date: 03/28/2024 CLINICAL DATA:  History of CHF.  Hypoglycemia. EXAM: PORTABLE CHEST 1 VIEW COMPARISON:  Chest x-ray 03/24/2024 FINDINGS: The heart is enlarged. Lung volumes are low. There is no focal lung infiltrate, pleural effusion or pneumothorax. No acute fractures are seen. IMPRESSION: Cardiomegaly. No acute cardiopulmonary process. Electronically Signed   By: Greig Pique M.D.   On: 03/28/2024 22:31     Procedures   Medications Ordered in the ED  morphine  (PF) 4 MG/ML injection 4 mg (4 mg Intravenous Given 03/28/24 2251)    Clinical Course as of 03/29/24 0037  Sat Mar 28, 2024  2153 Patient with h/o T1DM, ESRD with TTS HD, HTN, chronic HFrEF (EF 30-35%), anemia of chronic renal disease, HLD, BPD, and recent admission for DKA 11/19, presents after hypoglycemic episode tonight with CBG on scene of 35. Arrives to ED awake and alert. Complains of generalized pain she attributes to missing her dialysis treatments. VSS. IV team consulted to obtain IV access. Labs pending.  [SU]    Clinical Course User Index [SU] Odell Balls, PA-C                                 Medical Decision Making Amount and/or  Complexity of Data Reviewed Labs: ordered.        Final diagnoses:  None    ED Discharge Orders     None          Odell Balls, PA-C 03/29/24 0037    Cottie Cough PARAS, MD 03/29/24 [phone]

## 2024-03-29 ENCOUNTER — Encounter (HOSPITAL_COMMUNITY): Payer: Self-pay | Admitting: Family Medicine

## 2024-03-29 DIAGNOSIS — E1022 Type 1 diabetes mellitus with diabetic chronic kidney disease: Secondary | ICD-10-CM | POA: Diagnosis not present

## 2024-03-29 DIAGNOSIS — I5023 Acute on chronic systolic (congestive) heart failure: Secondary | ICD-10-CM

## 2024-03-29 DIAGNOSIS — E10649 Type 1 diabetes mellitus with hypoglycemia without coma: Principal | ICD-10-CM

## 2024-03-29 DIAGNOSIS — E162 Hypoglycemia, unspecified: Secondary | ICD-10-CM | POA: Diagnosis not present

## 2024-03-29 DIAGNOSIS — F319 Bipolar disorder, unspecified: Secondary | ICD-10-CM | POA: Diagnosis not present

## 2024-03-29 DIAGNOSIS — R6510 Systemic inflammatory response syndrome (SIRS) of non-infectious origin without acute organ dysfunction: Secondary | ICD-10-CM | POA: Diagnosis not present

## 2024-03-29 DIAGNOSIS — N186 End stage renal disease: Secondary | ICD-10-CM | POA: Diagnosis not present

## 2024-03-29 DIAGNOSIS — R188 Other ascites: Secondary | ICD-10-CM

## 2024-03-29 DIAGNOSIS — Z992 Dependence on renal dialysis: Secondary | ICD-10-CM

## 2024-03-29 DIAGNOSIS — I5022 Chronic systolic (congestive) heart failure: Secondary | ICD-10-CM

## 2024-03-29 LAB — COMPREHENSIVE METABOLIC PANEL WITH GFR
ALT: 31 U/L (ref 0–44)
AST: 79 U/L — ABNORMAL HIGH (ref 15–41)
Albumin: 3 g/dL — ABNORMAL LOW (ref 3.5–5.0)
Alkaline Phosphatase: 463 U/L — ABNORMAL HIGH (ref 38–126)
Anion gap: 14 (ref 5–15)
BUN: 40 mg/dL — ABNORMAL HIGH (ref 6–20)
CO2: 18 mmol/L — ABNORMAL LOW (ref 22–32)
Calcium: 8.3 mg/dL — ABNORMAL LOW (ref 8.9–10.3)
Chloride: 107 mmol/L (ref 98–111)
Creatinine, Ser: 7.54 mg/dL — ABNORMAL HIGH (ref 0.44–1.00)
GFR, Estimated: 7 mL/min — ABNORMAL LOW (ref >60)
Glucose, Bld: 96 mg/dL (ref 70–99)
Potassium: 5.3 mmol/L — ABNORMAL HIGH (ref 3.5–5.1)
Sodium: 139 mmol/L (ref 135–145)
Total Bilirubin: 0.9 mg/dL (ref 0.0–1.2)
Total Protein: 5.6 g/dL — ABNORMAL LOW (ref 6.5–8.1)

## 2024-03-29 LAB — CBC
HCT: 37.8 % (ref 36.0–46.0)
HCT: 32.9 % — ABNORMAL LOW (ref 36.0–46.0)
Hemoglobin: 11.3 g/dL — ABNORMAL LOW (ref 12.0–15.0)
Hemoglobin: 9.8 g/dL — ABNORMAL LOW (ref 12.0–15.0)
MCH: 26.8 pg (ref 26.0–34.0)
MCH: 26.8 pg (ref 26.0–34.0)
MCHC: 29.9 g/dL — ABNORMAL LOW (ref 30.0–36.0)
MCHC: 29.8 g/dL — ABNORMAL LOW (ref 30.0–36.0)
MCV: 89.8 fL (ref 80.0–100.0)
MCV: 89.9 fL (ref 80.0–100.0)
Platelets: 188 K/uL (ref 150–400)
Platelets: 163 K/uL (ref 150–400)
RBC: 4.21 MIL/uL (ref 3.87–5.11)
RBC: 3.66 MIL/uL — ABNORMAL LOW (ref 3.87–5.11)
RDW: 15.9 % — ABNORMAL HIGH (ref 11.5–15.5)
RDW: 15.9 % — ABNORMAL HIGH (ref 11.5–15.5)
WBC: 16 K/uL — ABNORMAL HIGH (ref 4.0–10.5)
WBC: 13.3 K/uL — ABNORMAL HIGH (ref 4.0–10.5)
nRBC: 0 % (ref 0.0–0.2)
nRBC: 0 % (ref 0.0–0.2)

## 2024-03-29 LAB — RENAL FUNCTION PANEL
Albumin: 2.4 g/dL — ABNORMAL LOW (ref 3.5–5.0)
Anion gap: 12 (ref 5–15)
BUN: 40 mg/dL — ABNORMAL HIGH (ref 6–20)
CO2: 20 mmol/L — ABNORMAL LOW (ref 22–32)
Calcium: 8.1 mg/dL — ABNORMAL LOW (ref 8.9–10.3)
Chloride: 108 mmol/L (ref 98–111)
Creatinine, Ser: 7.62 mg/dL — ABNORMAL HIGH (ref 0.44–1.00)
GFR, Estimated: 7 mL/min — ABNORMAL LOW (ref >60)
Glucose, Bld: 89 mg/dL (ref 70–99)
Phosphorus: 9.1 mg/dL — ABNORMAL HIGH (ref 2.5–4.6)
Potassium: 5.4 mmol/L — ABNORMAL HIGH (ref 3.5–5.1)
Sodium: 140 mmol/L (ref 135–145)

## 2024-03-29 LAB — GLUCOSE, CAPILLARY
Glucose-Capillary: 69 mg/dL — ABNORMAL LOW (ref 70–99)
Glucose-Capillary: 69 mg/dL — ABNORMAL LOW (ref 70–99)
Glucose-Capillary: 87 mg/dL (ref 70–99)
Glucose-Capillary: 44 mg/dL — CL (ref 70–99)
Glucose-Capillary: 51 mg/dL — ABNORMAL LOW (ref 70–99)
Glucose-Capillary: 152 mg/dL — ABNORMAL HIGH (ref 70–99)
Glucose-Capillary: 232 mg/dL — ABNORMAL HIGH (ref 70–99)

## 2024-03-29 LAB — CBG MONITORING, ED
Glucose-Capillary: 138 mg/dL — ABNORMAL HIGH (ref 70–99)
Glucose-Capillary: 175 mg/dL — ABNORMAL HIGH (ref 70–99)

## 2024-03-29 LAB — PROCALCITONIN: Procalcitonin: 36.95 ng/mL

## 2024-03-29 LAB — I-STAT CG4 LACTIC ACID, ED: Lactic Acid, Venous: 1.1 mmol/L (ref 0.5–1.9)

## 2024-03-29 MED ORDER — SACUBITRIL-VALSARTAN 24-26 MG PO TABS
1.0000 | ORAL_TABLET | Freq: Two times a day (BID) | ORAL | Status: DC
  Administered 2024-03-29 – 2024-03-30 (×3): 1 via ORAL
  Filled 2024-03-29 (×3): qty 1

## 2024-03-29 MED ORDER — AMLODIPINE BESYLATE 5 MG PO TABS
5.0000 mg | ORAL_TABLET | Freq: Every day | ORAL | Status: DC
  Administered 2024-03-29 – 2024-03-30 (×2): 5 mg via ORAL
  Filled 2024-03-29 (×2): qty 1

## 2024-03-29 MED ORDER — METRONIDAZOLE 500 MG/100ML IV SOLN: 500.0000 mg | Freq: Once | INTRAVENOUS | Status: DC

## 2024-03-29 MED ORDER — BUMETANIDE 2 MG PO TABS
10.0000 mg | ORAL_TABLET | Freq: Every day | ORAL | Status: DC
  Administered 2024-03-29 – 2024-03-30 (×2): 10 mg via ORAL
  Filled 2024-03-29 (×4): qty 5

## 2024-03-29 MED ORDER — SEVELAMER CARBONATE 800 MG PO TABS
800.0000 mg | ORAL_TABLET | Freq: Three times a day (TID) | ORAL | Status: DC
  Administered 2024-03-29 – 2024-03-30 (×3): 800 mg via ORAL
  Filled 2024-03-29 (×3): qty 1

## 2024-03-29 MED ORDER — ROSUVASTATIN CALCIUM 20 MG PO TABS
40.0000 mg | ORAL_TABLET | Freq: Every day | ORAL | Status: DC
  Administered 2024-03-29 – 2024-03-30 (×2): 40 mg via ORAL
  Filled 2024-03-29 (×2): qty 2

## 2024-03-29 MED ORDER — OLANZAPINE 5 MG PO TBDP
5.0000 mg | ORAL_TABLET | Freq: Every day | ORAL | Status: DC
  Administered 2024-03-29: 5 mg via ORAL
  Filled 2024-03-29: qty 1

## 2024-03-29 MED ORDER — VANCOMYCIN HCL 1250 MG/250ML IV SOLN
1250.0000 mg | Freq: Once | INTRAVENOUS | Status: DC
  Filled 2024-03-29: qty 250

## 2024-03-29 MED ORDER — ACETAMINOPHEN 650 MG RE SUPP: 650.0000 mg | Freq: Four times a day (QID) | RECTAL | Status: DC | PRN

## 2024-03-29 MED ORDER — LIDOCAINE HCL (PF) 1 % IJ SOLN: 5.0000 mL | INTRAMUSCULAR | Status: DC | PRN

## 2024-03-29 MED ORDER — ACETAMINOPHEN 325 MG PO TABS: 650.0000 mg | ORAL_TABLET | Freq: Four times a day (QID) | ORAL | Status: DC | PRN

## 2024-03-29 MED ORDER — PROCHLORPERAZINE EDISYLATE 10 MG/2ML IJ SOLN: 5.0000 mg | Freq: Four times a day (QID) | INTRAMUSCULAR | Status: DC | PRN

## 2024-03-29 MED ORDER — INSULIN ASPART 100 UNIT/ML IJ SOLN: 0.0000 [IU] | Freq: Every day | INTRAMUSCULAR | Status: DC

## 2024-03-29 MED ORDER — PENTAFLUOROPROP-TETRAFLUOROETH EX AERO: 1.0000 | INHALATION_SPRAY | CUTANEOUS | Status: DC | PRN

## 2024-03-29 MED ORDER — INSULIN ASPART 100 UNIT/ML IJ SOLN: 2.0000 [IU] | Freq: Three times a day (TID) | INTRAMUSCULAR | Status: DC

## 2024-03-29 MED ORDER — SODIUM CHLORIDE 0.9% FLUSH
3.0000 mL | Freq: Two times a day (BID) | INTRAVENOUS | Status: DC
  Administered 2024-03-29 (×2): 3 mL via INTRAVENOUS

## 2024-03-29 MED ORDER — OXYCODONE-ACETAMINOPHEN 5-325 MG PO TABS
1.0000 | ORAL_TABLET | Freq: Once | ORAL | Status: AC
  Administered 2024-03-29: 1 via ORAL
  Filled 2024-03-29: qty 1

## 2024-03-29 MED ORDER — SODIUM CHLORIDE 0.9 % IV SOLN: 2.0000 g | Freq: Once | INTRAVENOUS | Status: DC

## 2024-03-29 MED ORDER — HEPARIN SODIUM (PORCINE) 1000 UNIT/ML DIALYSIS: 1000.0000 [IU] | INTRAMUSCULAR | Status: DC | PRN

## 2024-03-29 MED ORDER — HYDROXYZINE HCL 25 MG PO TABS
25.0000 mg | ORAL_TABLET | Freq: Three times a day (TID) | ORAL | Status: DC | PRN
  Administered 2024-03-29 (×2): 25 mg via ORAL
  Filled 2024-03-29 (×3): qty 1

## 2024-03-29 MED ORDER — DEXTROSE 50 % IV SOLN
INTRAVENOUS | Status: AC
  Administered 2024-03-29: 50 mL
  Filled 2024-03-29: qty 50

## 2024-03-29 MED ORDER — HEPARIN SODIUM (PORCINE) 1000 UNIT/ML DIALYSIS: 2500.0000 [IU] | Freq: Once | INTRAMUSCULAR | Status: DC

## 2024-03-29 MED ORDER — CHLORHEXIDINE GLUCONATE CLOTH 2 % EX PADS
6.0000 | MEDICATED_PAD | Freq: Every day | CUTANEOUS | Status: DC
  Administered 2024-03-29 – 2024-03-30 (×2): 6 via TOPICAL

## 2024-03-29 MED ORDER — HYDRALAZINE HCL 20 MG/ML IJ SOLN
10.0000 mg | Freq: Four times a day (QID) | INTRAMUSCULAR | Status: DC | PRN
  Administered 2024-03-30: 10 mg via INTRAVENOUS
  Filled 2024-03-29: qty 1

## 2024-03-29 MED ORDER — GLUCOSE 40 % PO GEL
ORAL | Status: AC
  Administered 2024-03-29: 37.5 g
  Filled 2024-03-29: qty 1.21

## 2024-03-29 MED ORDER — GLUCOSE 40 % PO GEL: 2.0000 | ORAL | Status: DC

## 2024-03-29 MED ORDER — OXYCODONE HCL 5 MG PO TABS
5.0000 mg | ORAL_TABLET | Freq: Four times a day (QID) | ORAL | Status: DC | PRN
  Filled 2024-03-29: qty 1

## 2024-03-29 MED ORDER — ALTEPLASE 2 MG IJ SOLR: 2.0000 mg | Freq: Once | INTRAMUSCULAR | Status: DC | PRN

## 2024-03-29 MED ORDER — LAMOTRIGINE 100 MG PO TABS
200.0000 mg | ORAL_TABLET | Freq: Every day | ORAL | Status: DC
  Administered 2024-03-29 – 2024-03-30 (×2): 200 mg via ORAL
  Filled 2024-03-29 (×2): qty 2

## 2024-03-29 MED ORDER — LIDOCAINE-PRILOCAINE 2.5-2.5 % EX CREA: 1.0000 | TOPICAL_CREAM | CUTANEOUS | Status: DC | PRN

## 2024-03-29 MED ORDER — PANTOPRAZOLE SODIUM 40 MG PO TBEC
40.0000 mg | DELAYED_RELEASE_TABLET | Freq: Two times a day (BID) | ORAL | Status: DC
  Administered 2024-03-29 – 2024-03-30 (×3): 40 mg via ORAL
  Filled 2024-03-29 (×3): qty 1

## 2024-03-29 MED ORDER — INSULIN ASPART 100 UNIT/ML IJ SOLN
0.0000 [IU] | Freq: Three times a day (TID) | INTRAMUSCULAR | Status: DC
  Administered 2024-03-30: 2 [IU] via SUBCUTANEOUS
  Filled 2024-03-29: qty 5

## 2024-03-29 MED ORDER — TRAMADOL HCL 50 MG PO TABS
50.0000 mg | ORAL_TABLET | Freq: Two times a day (BID) | ORAL | Status: DC | PRN
  Administered 2024-03-29: 50 mg via ORAL
  Filled 2024-03-29: qty 1

## 2024-03-29 MED ORDER — DULOXETINE HCL 20 MG PO CPEP
20.0000 mg | ORAL_CAPSULE | Freq: Every day | ORAL | Status: DC
  Administered 2024-03-29 – 2024-03-30 (×2): 20 mg via ORAL
  Filled 2024-03-29 (×2): qty 1

## 2024-03-29 MED ORDER — GLUCOSE 40 % PO GEL
1.0000 | ORAL | Status: AC
  Administered 2024-03-29: 31 g via ORAL
  Filled 2024-03-29: qty 1.21

## 2024-03-29 MED ORDER — METRONIDAZOLE 500 MG/100ML IV SOLN: 500.0000 mg | Freq: Three times a day (TID) | INTRAVENOUS | Status: DC

## 2024-03-29 MED ORDER — OXYCODONE HCL 5 MG PO TABS
5.0000 mg | ORAL_TABLET | ORAL | Status: DC | PRN
  Filled 2024-03-29: qty 1

## 2024-03-29 MED ORDER — CARVEDILOL 25 MG PO TABS
25.0000 mg | ORAL_TABLET | Freq: Two times a day (BID) | ORAL | Status: DC
  Administered 2024-03-29 – 2024-03-30 (×3): 25 mg via ORAL
  Filled 2024-03-29 (×3): qty 1

## 2024-03-29 MED ORDER — HEPARIN SODIUM (PORCINE) 5000 UNIT/ML IJ SOLN
5000.0000 [IU] | Freq: Three times a day (TID) | INTRAMUSCULAR | Status: DC
  Administered 2024-03-29 – 2024-03-30 (×3): 5000 [IU] via SUBCUTANEOUS
  Filled 2024-03-29 (×3): qty 1

## 2024-03-29 MED ORDER — ANTICOAGULANT SODIUM CITRATE 4% (200MG/5ML) IV SOLN: 5.0000 mL | Status: DC | PRN

## 2024-03-29 NOTE — Progress Notes (Signed)
 PROGRESS NOTE     Patient Demographics:    Ashley Freeman, is a 33 y.o. female, DOB - 1990-12-01, FMW:[id number]  Outpatient Primary MD for the patient is [name], [name], MD    LOS - 0  Admit date - 03/28/2024    Chief Complaint  Patient presents with   Hypoglycemia       Brief Narrative (HPI from H&P)    33 y.o. female with medical history significant for ESRD on hemodialysis, chronic HFrEF, type 1 diabetes mellitus, hypertension, and bipolar disorder who was brought into the ED with hypoglycemia.   Patient reports that she was last dialyzed on 03/24/2024.  She had been experiencing some generalized aches and fatigue which she attributes to missing dialysis, but had otherwise been in her usual state until she developed anxiety and shakes last night at roughly 8 PM.  She continued to feel worse despite drinking orange juice and her mother called EMS.  CBG was 35 on the scene, patient had decreased LOC, was treated with glucagon , and brought into the ED.   Subjective:    Ashley Freeman today has, No headache, No chest pain, No abdominal pain - No Nausea, No new weakness tingling or numbness, no shortness of breath   Assessment  & Plan :   1. SIRS - HR and WBC elevated in ED; blood cultures were collected and she was given broad-spectrum antibiotics  - No infectious process identified, will check procalcitonin, repeat CBC in am, follow cultures and clinical course, hold further antibiotics for now, currently afebrile and nontoxic.  Continue to monitor cultures and clinically of off antibiotics.   2. Hypoglycemia; type I DM  - A1c was 10.8% in August 2025  - CBG was 1 with EMS and she was  treated with glucagon  prior to arrival  - No further hypoglycemia in ED  - History of very labile blood sugars, shoot for permissive hyperglycemia, noncompliant with insulin  regimen at home, counseled again.  Monitor on sensitive before every meal CBGs.  CBG (last 3)  Recent Labs    03/29/24 0246 03/29/24 0739 03/29/24 9193  GLUCAP 138* 69* 69*      3. ESRD  - Last HD was 11/25 per patient report  - She appears hypervolemic but no respiratory symptoms or electrolyte derangements requiring urgent dialysis  - Renally-dose medications, restrict fluids, continue Renvela , nephrology consulted for HD treatments.  Patient counseled on compliance   4. Chronic HFrEF  - EF was 30-35% in May 2025  - Continue Bumex , Coreg , and Entresto    5. Ascites; elevated LFTs  - She underwent paracentesis on 03/13/24 with 6 liters off, SAAG supports portal hypertension as etiology - She was referred to GI last admission a few days ago but has not yet been seen    6. Bipolar disorder  - Continue Zyprexa , Lamictal , Cymbalta    7. HTN - Continue Bumex , Coreg , and Entresto , added low-dose Norvasc  along with as needed hydralazine  for better blood pressure control        Condition - Guarded  Family Communication  :  None present  Code Status :  Full  Consults  :  Renal  PUD Prophylaxis :  PPI   Procedures  :            Disposition Plan  :    Status is: Observation  DVT Prophylaxis  :    heparin  injection 5,000 Units Start: 03/29/24 1400    Lab Results  Component Value Date   PLT 188 03/29/2024    Diet :  Diet Order             Diet renal with fluid restriction Fluid restriction: 1200 mL Fluid; Room service appropriate? Yes; Fluid consistency: Thin  Diet effective now                    Inpatient Medications  Scheduled Meds:  amLODipine   5 mg Oral Daily   bumetanide   10 mg Oral Daily   carvedilol   25 mg Oral BID WC   DULoxetine   20 mg Oral Daily   heparin   5,000  Units Subcutaneous Q8H   insulin  aspart  0-6 Units Subcutaneous TID WC   lamoTRIgine   200 mg Oral Daily   OLANZapine  zydis  5 mg Oral QHS   pantoprazole   40 mg Oral BID   rosuvastatin   40 mg Oral Daily   sacubitril -valsartan   1 tablet Oral BID   sevelamer  carbonate  800 mg Oral TID WC   sodium chloride  flush  3 mL Intravenous Q12H   Continuous Infusions: PRN Meds:.acetaminophen  **OR** acetaminophen , hydrALAZINE , hydrOXYzine , oxyCODONE , prochlorperazine   Antibiotics  :    Anti-infectives (From admission, onward)    Start     Dose/Rate Route Frequency Ordered Stop   03/29/24 0215  metroNIDAZOLE  (FLAGYL ) IVPB 500 mg  Status:  Discontinued        500 mg 100 mL/hr over 60 Minutes Intravenous Every 8 hours 03/29/24 0204 03/29/24 0206   03/29/24 0215  metroNIDAZOLE  (FLAGYL ) IVPB 500 mg  Status:  Discontinued        500 mg 100 mL/hr over 60 Minutes Intravenous  Once 03/29/24 0206 03/29/24 0215   03/29/24 0215  vancomycin  (VANCOREADY) IVPB 1250 mg/250 mL  Status:  Discontinued        1,250 mg 166.7 mL/hr over 90 Minutes Intravenous  Once 03/29/24 0209 03/29/24 0215   03/29/24 0215  ceFEPIme  (MAXIPIME ) 2 g in sodium chloride  0.9 % 100 mL IVPB  Status:  Discontinued        2 g 200 mL/hr over 30 Minutes Intravenous  Once 03/29/24 0209  03/29/24 0215         Objective:   Vitals:   03/29/24 0414 03/29/24 0459 03/29/24 0550 03/29/24 0741  BP: (!) 161/99 (!) 173/104 (!) 186/106 (!) 157/105  Pulse: (!) 112 (!) 108 (!) 107 98  Resp: (!) 22 (!) 23 (!) 22 17  Temp: 98.7 F (37.1 C) 98.6 F (37 C)  98.9 F (37.2 C)  TempSrc: Oral Oral  Oral  SpO2: 98% 94% 92% 96%  Weight:      Height:        Wt Readings from Last 3 Encounters:  03/28/24 59.4 kg  03/24/24 59.7 kg  03/18/24 68.6 kg    No intake or output data in the 24 hours ending 03/29/24 0814   Physical Exam  Awake Alert, No new F.N deficits, Normal affect Doyle.AT,PERRAL Supple Neck, No JVD,   Symmetrical Chest wall  movement, Good air movement bilaterally, CTAB RRR,No Gallops,Rubs or new Murmurs,  +ve B.Sounds, Abd Soft, No tenderness,   No Cyanosis, Clubbing or edema        Data Review:    Recent Labs  Lab 03/23/24 0619 03/24/24 0547 03/28/24 2124 03/29/24 0623  WBC 11.7* 12.1* 22.2* 16.0*  HGB 12.2 10.5* 9.8* 11.3*  HCT 39.4 35.0* 33.5* 37.8  PLT 263 239 181 188  MCV 87.6 90.7 91.8 89.8  MCH 27.1 27.2 26.8 26.8  MCHC 31.0 30.0 29.3* 29.9*  RDW 16.4* 16.3* 15.9* 15.9*  LYMPHSABS 4.8*  --  3.3  --   MONOABS 1.2*  --  0.8  --   EOSABS 1.0*  --  0.8*  --   BASOSABS 0.1  --  0.1  --     Recent Labs  Lab 03/23/24 0619 03/24/24 0547 03/28/24 2124 03/29/24 0029 03/29/24 0623  NA 131* 135 141  --  139  K 3.8 4.3 5.1  --  5.3*  CL 92* 98 106  --  107  CO2 22 19* 23  --  18*  ANIONGAP 17* 18* 12  --  14  GLUCOSE 114* 338* 247*  --  96  BUN 26* 32* 36*  --  40*  CREATININE 5.41* 6.33* 7.71*  --  7.54*  AST  --   --  120*  --  79*  ALT  --   --  27  --  31  ALKPHOS  --   --  365*  --  463*  BILITOT  --   --  1.0  --  0.9  ALBUMIN   --   --  2.6*  --  3.0*  PROCALCITON 1.30  --   --   --  36.95  LATICACIDVEN  --   --   --  1.1  --   CALCIUM  8.1* 8.0* 8.1*  --  8.3*      Recent Labs  Lab 03/23/24 0619 03/24/24 0547 03/28/24 2124 03/29/24 0029 03/29/24 0623  PROCALCITON 1.30  --   --   --  36.95  LATICACIDVEN  --   --   --  1.1  --   CALCIUM  8.1* 8.0* 8.1*  --  8.3*     Micro Results Recent Results (from the past 240 hours)  Resp panel by RT-PCR (RSV, Flu A&B, Covid) Anterior Nasal Swab     Status: None   Collection Time: 03/22/24  3:31 AM   Specimen: Anterior Nasal Swab  Result Value Ref Range Status   SARS Coronavirus 2 by RT PCR NEGATIVE NEGATIVE Final   Influenza  A by PCR NEGATIVE NEGATIVE Final   Influenza B by PCR NEGATIVE NEGATIVE Final    Comment: (NOTE) The Xpert Xpress SARS-CoV-2/FLU/RSV plus assay is intended as an aid in the diagnosis of influenza from  Nasopharyngeal swab specimens and should not be used as a sole basis for treatment. Nasal washings and aspirates are unacceptable for Xpert Xpress SARS-CoV-2/FLU/RSV testing.  Fact Sheet for Patients: bloggercourse.com  Fact Sheet for Healthcare Providers: seriousbroker.it  This test is not yet approved or cleared by the [place]  FDA and has been authorized for detection and/or diagnosis of SARS-CoV-2 by FDA under an Emergency Use Authorization (EUA). This EUA will remain in effect (meaning this test can be used) for the duration of the COVID-19 declaration under Section 564(b)(1) of the Act, 21 U.S.C. section 360bbb-3(b)(1), unless the authorization is terminated or revoked.     Resp Syncytial Virus by PCR NEGATIVE NEGATIVE Final    Comment: (NOTE) Fact Sheet for Patients: bloggercourse.com  Fact Sheet for Healthcare Providers: seriousbroker.it  This test is not yet approved or cleared by the [place]  FDA and has been authorized for detection and/or diagnosis of SARS-CoV-2 by FDA under an Emergency Use Authorization (EUA). This EUA will remain in effect (meaning this test can be used) for the duration of the COVID-19 declaration under Section 564(b)(1) of the Act, 21 U.S.C. section 360bbb-3(b)(1), unless the authorization is terminated or revoked.  Performed at [facility], [address] [place], [place] [address]   MRSA Next Gen by PCR, Nasal     Status: None   Collection Time: 03/23/24  1:26 AM   Specimen: Nasal Mucosa; Nasal Swab  Result Value Ref Range Status   MRSA by PCR Next Gen NOT DETECTED NOT DETECTED Final    Comment: (NOTE) The GeneXpert MRSA Assay (FDA approved for NASAL specimens only), is one component of a comprehensive MRSA colonization surveillance program. It is not intended to diagnose MRSA infection nor to guide or monitor treatment  for MRSA infections. Test performance is not FDA approved in patients less than 49 years old. Performed at [facility], [address] [place], [place] [address]   Gram stain     Status: None   Collection Time: 03/23/24  1:37 PM   Specimen: Abdomen; Peritoneal Fluid  Result Value Ref Range Status   Specimen Description PERITONEAL  Final   Special Requests NONE  Final   Gram Stain   Final    WBC PRESENT, PREDOMINANTLY MONONUCLEAR CORRECTED RESULTS CORRECTED ON 11/25 AT 1214: PREVIOUSLY REPORTED AS GRAM POSITIVE COCCI NO ORGANISMS SEEN PREVIOUSLY REPORTED AS: GRAM POSITIVE COCCI CORRECTED RESULTS CALLED TO: PHARMD EMILY S [id number] FCP Performed at [facility], [address] [place], [place] [address]    Report Status 03/24/2024 FINAL  Final  Culture, body fluid w Gram Stain-bottle     Status: None (Preliminary result)   Collection Time: 03/23/24  2:14 PM   Specimen: Peritoneal Washings  Result Value Ref Range Status   Specimen Description PERITONEAL  Final   Special Requests NONE  Final   Gram Stain PENDING  Incomplete   Culture   Final    NO GROWTH 5 DAYS Performed at [facility], [address] [place], [place] [address]    Report Status PENDING  Incomplete    Radiology Report DG Chest Portable 1 View Result Date: 03/28/2024 CLINICAL DATA:  History of CHF.  Hypoglycemia. EXAM: PORTABLE CHEST 1 VIEW COMPARISON:  Chest x-ray 03/24/2024 FINDINGS: The heart is enlarged. Lung volumes are low. There is no focal lung infiltrate, pleural effusion or pneumothorax. No acute fractures are seen. IMPRESSION: Cardiomegaly. No acute cardiopulmonary process. Electronically Signed   By: Greig Pique M.D.   On: 03/28/2024 22:31     Signature  -   Lavada Stank M.D on 03/29/2024 at 8:14 AM   -  To page go to [email or web address]

## 2024-03-29 NOTE — Plan of Care (Signed)
  Problem: Coping: Goal: Ability to adjust to condition or change in health will improve Outcome: Progressing   Problem: Health Behavior/Discharge Planning: Goal: Ability to manage health-related needs will improve Outcome: Progressing   Problem: Metabolic: Goal: Ability to maintain appropriate glucose levels will improve Outcome: Progressing   Problem: Clinical Measurements: Goal: Will remain free from infection Outcome: Progressing Goal: Diagnostic test results will improve Outcome: Progressing Goal: Respiratory complications will improve Outcome: Progressing   Problem: Activity: Goal: Risk for activity intolerance will decrease Outcome: Progressing   Problem: Coping: Goal: Level of anxiety will decrease Outcome: Progressing

## 2024-03-29 NOTE — Progress Notes (Signed)
 ED Pharmacy Antibiotic Sign Off An antibiotic consult was received from an ED provider for Vancomycin/Cefepime per pharmacy dosing for sepsis. A chart review was completed to assess appropriateness.   The following one time order(s) were placed:  Vancomycin 1250 mg IV x 1 Cefepime 2g IV x 1  Further antibiotic and/or antibiotic pharmacy consults should be ordered by the admitting provider if indicated.   Abran Duke, PharmD, BCPS Clinical Pharmacist Phone: [phone]

## 2024-03-29 NOTE — Plan of Care (Signed)
  Problem: Fluid Volume: Goal: Ability to maintain a balanced intake and output will improve Outcome: Progressing   Problem: Metabolic: Goal: Ability to maintain appropriate glucose levels will improve Outcome: Progressing   Problem: Nutritional: Goal: Maintenance of adequate nutrition will improve Outcome: Progressing

## 2024-03-29 NOTE — H&P (Signed)
 History and Physical    Ashley Freeman FMW:[id number] DOB: [birth year] DOA: 03/28/2024  PCP: Keven Crumbly Pap, MD   Patient coming from: Home   Chief Complaint: Hypoglycemia   HPI: Ashley Freeman is a 33 y.o. female with medical history significant for ESRD on hemodialysis, chronic HFrEF, type 1 diabetes mellitus, hypertension, and bipolar disorder who was brought into the ED with hypoglycemia.  Patient reports that she was last dialyzed on 03/24/2024.  She had been experiencing some generalized aches and fatigue which she attributes to missing dialysis, but had otherwise been in her usual state until she developed anxiety and shakes last night at roughly 8 PM.  She continued to feel worse despite drinking orange juice and her mother called EMS.  CBG was 35 on the scene, patient had decreased LOC, was treated with glucagon , and brought into the ED.  She denies fevers, cough, dysuria, rash, boils, or neck pain or stiffness.  ED Course: Upon arrival to the ED, patient is found to be afebrile and saturating well on room air with mild tachycardia and elevated blood pressure.  Labs are most notable for normal potassium, normal serum bicarbonate, BUN 36, alkaline phosphatase 365, albumin  2.6, AST 120, WBC 22,200, and normal lactic acid.  Chest x-ray is negative for acute findings.  Blood cultures were collected in the ED and the patient was treated with morphine , vancomycin , cefepime , and Flagyl .  Review of Systems:  All other systems reviewed and apart from HPI, are negative.  Past Medical History:  Diagnosis Date   Abscess, gluteal, right 08/24/2013   Anemia 02/19/2012   Bartholin's gland abscess 09/19/2013   Bipolar disorder (HCC)    BV (bacterial vaginosis) 11/24/2015   Depression    Diabetes mellitus type I (HCC) 2001   Diagnosed at age 58 ; Type I   Diarrhea 05/30/2016   DKA (diabetic ketoacidoses) 08/19/2013   Also in 2018   ESRD (end stage renal  disease) (HCC)    Gonorrhea 08/2011   Treated in 09/2011   HFrEF (heart failure with reduced ejection fraction) (HCC)    a. 2022 Echo: EF 40%; b. 10/2021 Echo: EF 55%; b. 07/2022 MV: No ischemia. EF 31%; c. 08/2022 Echo: EF 35%, mildly dil RV, sev TR.   History of trichomoniasis 05/31/2016   Hyperlipidemia 03/28/2016   Hypertension    NICM (nonischemic cardiomyopathy) (HCC)    Sepsis (HCC) 09/19/2013    Past Surgical History:  Procedure Laterality Date   A/V FISTULAGRAM Right 06/17/2023   Procedure: A/V Fistulagram;  Surgeon: Marea Selinda GORMAN, MD;  Location: [facility] INVASIVE CV LAB;  Service: Cardiovascular;  Laterality: Right;   A/V SHUNT INTERVENTION N/A 09/25/2023   Procedure: A/V SHUNT INTERVENTION;  Surgeon: Pearline Norman GORMAN, MD;  Location: [facility] LAB;  Service: Cardiovascular;  Laterality: N/A;   AV FISTULA PLACEMENT Right 07/06/2022   Procedure: ARTERIOVENOUS GRAFT CREATION;  Surgeon: Gretta Lonni PARAS, MD;  Location: [facility] OR;  Service: Vascular;  Laterality: Right;   CESAREAN SECTION N/A 10/05/2019   Procedure: CESAREAN SECTION;  Surgeon: Izell Harari, MD;  Location: [facility] LD ORS;  Service: Obstetrics;  Laterality: N/A;   CHOLECYSTECTOMY N/A 07/02/2023   Procedure: LAPAROSCOPIC CHOLECYSTECTOMY;  Surgeon: Ebbie Cough, MD;  Location: [facility] OR;  Service: General;  Laterality: N/A;   ESOPHAGOGASTRODUODENOSCOPY N/A 01/31/2024   Procedure: EGD (ESOPHAGOGASTRODUODENOSCOPY);  Surgeon: San Sandor GAILS, DO;  Location: [facility] ENDOSCOPY;  Service: Gastroenterology;  Laterality: N/A;   INCISION AND DRAINAGE ABSCESS Left 09/28/2019   Procedure:  INCISION AND DRAINAGE VULVAR ABCESS;  Surgeon: Edsel Norleen GAILS, MD;  Location: [facility] OR;  Service: Gynecology;  Laterality: Left;   INCISION AND DRAINAGE ABSCESS Right 02/23/2024   Procedure: INCISION AND DRAINAGE, ABSCESS;  Surgeon: Tobie Eldora NOVAK, MD;  Location: [facility] OR;  Service: ENT;  Laterality: Right;   INCISION AND DRAINAGE PERIRECTAL ABSCESS Right  08/18/2013   Procedure: IRRIGATION AND DEBRIDEMENT GLUTEAL ABSCESS;  Surgeon: Lynda Leos, MD;  Location: [facility] OR;  Service: General;  Laterality: Right;   INCISION AND DRAINAGE PERIRECTAL ABSCESS Right 09/19/2013   Procedure: IRRIGATION AND DEBRIDEMENT RIGHT GLUTEAL AND LABIAL ABSCESSES;  Surgeon: Lynda Leos, MD;  Location: [facility] OR;  Service: General;  Laterality: Right;   INCISION AND DRAINAGE PERIRECTAL ABSCESS Right 09/24/2013   Procedure: IRRIGATION AND DEBRIDEMENT PERIRECTAL ABSCESS;  Surgeon: Lynwood MALVA Pina, MD;  Location: [facility] OR;  Service: General;  Laterality: Right;   IR PARACENTESIS  08/28/2023   IR PARACENTESIS  11/04/2023   IR PARACENTESIS  12/23/2023   IR PARACENTESIS  02/04/2024   IR PARACENTESIS  03/23/2024    Social History:   reports that she has never smoked. She has never been exposed to tobacco smoke. She has never used smokeless tobacco. She reports that she does not currently use alcohol . She reports that she does not use drugs.  Allergies  Allergen Reactions   Keflex  [Cephalexin ] Anaphylaxis    Ceftriaxone  in the past with no reaction   Penicillins Anaphylaxis, Hives and Rash   Vibramycin  [Doxycycline ] Anaphylaxis   Benadryl  [Diphenhydramine ] Itching   Dilaudid  [Hydromorphone ] Itching   Methotrexate And Trimetrexate Rash   Roxicodone  [Oxycodone ] Itching    Takes Percocet without issue    Family History  Problem Relation Age of Onset   Asthma Mother    Carpal tunnel syndrome Mother    Gout Father    Diabetes Paternal Grandmother    Anesthesia problems Neg Hx      Prior to Admission medications   Medication Sig Start Date End Date Taking? Authorizing Provider  albuterol  (VENTOLIN  HFA) 108 (90 Base) MCG/ACT inhaler Inhale 2 puffs into the lungs every 4 (four) hours as needed for wheezing or shortness of breath. 11/24/22   [provider]  bumetanide  (BUMEX ) 2 MG tablet Take 10 mg by mouth daily.    [provider]  carvedilol  (COREG ) 25  MG tablet Take 25 mg by mouth 2 (two) times daily with a meal.    [provider]  DULoxetine  (CYMBALTA ) 20 MG capsule Take 20 mg by mouth daily. 07/29/23   [provider]  fluticasone  (FLONASE ) 50 MCG/ACT nasal spray Place 2 sprays into both nostrils daily. 12/11/23 12/10/24  [provider]  hydrOXYzine  (ATARAX ) 25 MG tablet Take 25 mg by mouth 3 (three) times daily as needed for anxiety, itching, nausea or vomiting.    [provider]  lamoTRIgine  (LAMICTAL ) 200 MG tablet Take 1 tablet (200 mg total) by mouth daily. 10/29/23   Regalado, Belkys A, MD  LANTUS  SOLOSTAR 100 UNIT/ML Solostar Pen Inject 10 Units into the skin daily. 02/07/24   Drusilla Sabas RAMAN, MD  metoCLOPramide  (REGLAN ) 10 MG tablet Take 0.5 tablets (5 mg total) by mouth every 8 (eight) hours as needed for nausea or refractory nausea / vomiting. 02/07/24   Drusilla Sabas RAMAN, MD  NOVOLOG  FLEXPEN 100 UNIT/ML FlexPen Inject 5 Units into the skin 3 (three) times daily with meals. 01/18/24   [provider]  OLANZapine  zydis (ZYPREXA ) 5 MG disintegrating tablet Take  5 mg by mouth at bedtime. 09/23/23 03/23/24  [provider]  pantoprazole  (PROTONIX ) 40 MG tablet Take 1 tablet (40 mg total) by mouth 2 (two) times daily. 02/07/24 04/10/24  Drusilla Sabas RAMAN, MD  prochlorperazine  (COMPAZINE ) 5 MG tablet Take 5 mg by mouth every 6 (six) hours as needed for nausea or vomiting.    [provider]  rosuvastatin  (CRESTOR ) 40 MG tablet Take 40 mg by mouth daily. 01/20/24 01/19/25  [provider]  sacubitril -valsartan  (ENTRESTO ) 24-26 MG Take 1 tablet by mouth 2 (two) times daily.    [provider]  sevelamer  carbonate (RENVELA ) 800 MG tablet Take 1 tablet (800 mg total) by mouth 3 (three) times daily with meals. 02/07/24   Drusilla Sabas RAMAN, MD  sodium bicarbonate  650 MG tablet Take 650 mg by mouth 2 (two) times daily. 07/29/23   [provider]  sucralfate  (CARAFATE ) 1 GM/10ML  suspension Take 10 mLs (1 g total) by mouth 2 (two) times daily for 14 days. 02/07/24 03/23/24  Drusilla Sabas RAMAN, MD  SUMAtriptan  (IMITREX ) 50 MG tablet Take 50 mg by mouth every 2 (two) hours as needed for migraine or headache. 06/20/22   [provider]    Physical Exam: Vitals:   03/29/24 0130 03/29/24 0145 03/29/24 0200 03/29/24 0215  BP: (!) 175/118 (!) 180/107 (!) 182/104 (!) 167/115  Pulse: (!) 107 (!) 105 (!) 108 (!) 105  Resp: (!) 21 13 14 14   Temp:      TempSrc:      SpO2: 98% 96% 99% 98%  Weight:      Height:        Constitutional: NAD, no pallor or diaphoresis   Eyes: PERTLA, lids and conjunctivae normal ENMT: Mucous membranes are moist. Posterior pharynx clear of any exudate or lesions.   Neck: supple, no masses  Respiratory: no wheezing, no crackles. No accessory muscle use.  Cardiovascular: S1 & S2 heard, regular rate and rhythm. No extremity edema.  Abdomen: Distended, soft. Bowel sounds active.  Musculoskeletal: no clubbing / cyanosis. No joint deformity upper and lower extremities.   Skin: no significant rashes, lesions, ulcers. Warm, dry, well-perfused. Neurologic: CN 2-12 grossly intact. Moving all extremities. Alert and oriented.  Psychiatric: Calm. Cooperative.    Labs and Imaging on Admission: I have personally reviewed following labs and imaging studies  CBC: Recent Labs  Lab 03/23/24 0619 03/24/24 0547 03/28/24 2124  WBC 11.7* 12.1* 22.2*  NEUTROABS 4.5  --  17.0*  HGB 12.2 10.5* 9.8*  HCT 39.4 35.0* 33.5*  MCV 87.6 90.7 91.8  PLT 263 239 181   Basic Metabolic Panel: Recent Labs  Lab 03/23/24 0619 03/24/24 0547 03/28/24 2124  NA 131* 135 141  K 3.8 4.3 5.1  CL 92* 98 106  CO2 22 19* 23  GLUCOSE 114* 338* 247*  BUN 26* 32* 36*  CREATININE 5.41* 6.33* 7.71*  CALCIUM  8.1* 8.0* 8.1*   GFR: Estimated Creatinine Clearance: 8.7 mL/min (A) (by C-G formula based on SCr of 7.71 mg/dL (H)). Liver Function Tests: Recent Labs  Lab  03/28/24 2124  AST 120*  ALT 27  ALKPHOS 365*  BILITOT 1.0  PROT 4.9*  ALBUMIN  2.6*   No results for input(s): LIPASE, AMYLASE in the last 168 hours. No results for input(s): AMMONIA in the last 168 hours. Coagulation Profile: No results for input(s): INR, PROTIME in the last 168 hours. Cardiac Enzymes: No results for input(s): CKTOTAL, CKMB, CKMBINDEX, TROPONINI in the last 168 hours. BNP (  last 3 results) No results for input(s): PROBNP in the last 8760 hours. HbA1C: No results for input(s): HGBA1C in the last 72 hours. CBG: Recent Labs  Lab 03/28/24 2117 03/28/24 2202 03/28/24 2330 03/29/24 0056 03/29/24 0246  GLUCAP 171* 253* 198* 175* 138*   Lipid Profile: No results for input(s): CHOL, HDL, LDLCALC, TRIG, CHOLHDL, LDLDIRECT in the last 72 hours. Thyroid  Function Tests: No results for input(s): TSH, T4TOTAL, FREET4, T3FREE, THYROIDAB in the last 72 hours. Anemia Panel: No results for input(s): VITAMINB12, FOLATE, FERRITIN, TIBC, IRON , RETICCTPCT in the last 72 hours. Urine analysis:    Component Value Date/Time   COLORURINE YELLOW 10/25/2023 2300   APPEARANCEUR CLOUDY (A) 10/25/2023 2300   APPEARANCEUR Hazy 11/10/2013 2043   LABSPEC 1.016 10/25/2023 2300   LABSPEC 1.031 11/10/2013 2043   PHURINE 6.0 10/25/2023 2300   GLUCOSEU >=500 (A) 10/25/2023 2300   GLUCOSEU >=500 11/10/2013 2043   HGBUR SMALL (A) 10/25/2023 2300   BILIRUBINUR NEGATIVE 10/25/2023 2300   BILIRUBINUR negative 06/04/2018 1035   BILIRUBINUR Negative 11/24/2015 1443   BILIRUBINUR Negative 11/10/2013 2043   KETONESUR 5 (A) 10/25/2023 2300   PROTEINUR >=300 (A) 10/25/2023 2300   UROBILINOGEN 0.2 09/14/2019 1732   NITRITE NEGATIVE 10/25/2023 2300   LEUKOCYTESUR NEGATIVE 10/25/2023 2300   LEUKOCYTESUR 1+ 11/10/2013 2043   Sepsis Labs: @LABRCNTIP (procalcitonin:4,lacticidven:4) ) Recent Results (from the past 240 hours)  Resp panel by  RT-PCR (RSV, Flu A&B, Covid) Anterior Nasal Swab     Status: None   Collection Time: 03/22/24  3:31 AM   Specimen: Anterior Nasal Swab  Result Value Ref Range Status   SARS Coronavirus 2 by RT PCR NEGATIVE NEGATIVE Final   Influenza A by PCR NEGATIVE NEGATIVE Final   Influenza B by PCR NEGATIVE NEGATIVE Final    Comment: (NOTE) The Xpert Xpress SARS-CoV-2/FLU/RSV plus assay is intended as an aid in the diagnosis of influenza from Nasopharyngeal swab specimens and should not be used as a sole basis for treatment. Nasal washings and aspirates are unacceptable for Xpert Xpress SARS-CoV-2/FLU/RSV testing.  Fact Sheet for Patients: bloggercourse.com  Fact Sheet for Healthcare Providers: seriousbroker.it  This test is not yet approved or cleared by the [place]  FDA and has been authorized for detection and/or diagnosis of SARS-CoV-2 by FDA under an Emergency Use Authorization (EUA). This EUA will remain in effect (meaning this test can be used) for the duration of the COVID-19 declaration under Section 564(b)(1) of the Act, 21 U.S.C. section 360bbb-3(b)(1), unless the authorization is terminated or revoked.     Resp Syncytial Virus by PCR NEGATIVE NEGATIVE Final    Comment: (NOTE) Fact Sheet for Patients: bloggercourse.com  Fact Sheet for Healthcare Providers: seriousbroker.it  This test is not yet approved or cleared by the [place]  FDA and has been authorized for detection and/or diagnosis of SARS-CoV-2 by FDA under an Emergency Use Authorization (EUA). This EUA will remain in effect (meaning this test can be used) for the duration of the COVID-19 declaration under Section 564(b)(1) of the Act, 21 U.S.C. section 360bbb-3(b)(1), unless the authorization is terminated or revoked.  Performed at [facility], [address], [place], [place] [address]   MRSA Next  Gen by PCR, Nasal     Status: None   Collection Time: 03/23/24  1:26 AM   Specimen: Nasal Mucosa; Nasal Swab  Result Value Ref Range Status   MRSA by PCR Next Gen NOT DETECTED NOT DETECTED Final    Comment: (NOTE) The  GeneXpert MRSA Assay (FDA approved for NASAL specimens only), is one component of a comprehensive MRSA colonization surveillance program. It is not intended to diagnose MRSA infection nor to guide or monitor treatment for MRSA infections. Test performance is not FDA approved in patients less than 74 years old. Performed at [facility], [address], [place], [place] [address]   Gram stain     Status: None   Collection Time: 03/23/24  1:37 PM   Specimen: Abdomen; Peritoneal Fluid  Result Value Ref Range Status   Specimen Description PERITONEAL  Final   Special Requests NONE  Final   Gram Stain   Final    WBC PRESENT, PREDOMINANTLY MONONUCLEAR CORRECTED RESULTS CORRECTED ON 11/25 AT 1214: PREVIOUSLY REPORTED AS GRAM POSITIVE COCCI NO ORGANISMS SEEN PREVIOUSLY REPORTED AS: GRAM POSITIVE COCCI CORRECTED RESULTS CALLED TO: PHARMD EMILY S [id number] FCP Performed at [facility], [address], [place], [place] [address]    Report Status 03/24/2024 FINAL  Final  Culture, body fluid w Gram Stain-bottle     Status: None (Preliminary result)   Collection Time: 03/23/24  2:14 PM   Specimen: Peritoneal Washings  Result Value Ref Range Status   Specimen Description PERITONEAL  Final   Special Requests NONE  Final   Gram Stain PENDING  Incomplete   Culture   Final    NO GROWTH 5 DAYS Performed at [facility], [address], [place], [place] [address]    Report Status PENDING  Incomplete     Radiological Exams on Admission: DG Chest Portable 1 View Result Date: 03/28/2024 CLINICAL DATA:  History of CHF.  Hypoglycemia. EXAM: PORTABLE CHEST 1 VIEW COMPARISON:  Chest x-ray 03/24/2024 FINDINGS: The heart is enlarged. Lung volumes are low. There is  no focal lung infiltrate, pleural effusion or pneumothorax. No acute fractures are seen. IMPRESSION: Cardiomegaly. No acute cardiopulmonary process. Electronically Signed   By: Greig Pique M.D.   On: 03/28/2024 22:31    EKG: Independently reviewed. Sinus tachycardia, rate 105.   Assessment/Plan   1. SIRS - HR and WBC elevated in ED; blood cultures were collected and she was given broad-spectrum antibiotics  - No infectious process identified, will check procalcitonin, repeat CBC in am, follow cultures and clinical course, hold further antibiotics for now    2. Hypoglycemia; type I DM  - A1c was 10.8% in August 2025  - CBG was 57 with EMS and she was treated with glucagon  prior to arrival  - No further hypoglycemia in ED  - Check CBGs, hold long-acting insulin  and use Novolog  only for now   3. ESRD  - Last HD was 11/25 per patient report  - She appears hypervolemic but no respiratory symptoms or electrolyte derangements requiring urgent dialysis  - Renally-dose medications, restrict fluids, continue Renvela     4. Chronic HFrEF  - EF was 30-35% in May 2025  - Continue Bumex , Coreg , and Entresto     5. Ascites; elevated LFTs  - She underwent paracentesis on 03/13/24 with 6 liters off, SAAG supports portal hypertension as etiology - She was referred to GI last admission a few days ago but has not yet been seen   6. Bipolar disorder  - Continue Zyprexa , Lamictal , Cymbalta     DVT prophylaxis: sq heparin   Code Status: Full  Level of Care: Level of care: Progressive Family Communication: none present  Disposition Plan:  Patient is from: home  Anticipated d/c is to: Home  Anticipated  d/c date is: 12/1 or 03/31/24 Patient currently: Pending clinical stability  Consults called: none  Admission status: Observation     Evalene GORMAN Sprinkles, MD [facility]  03/29/2024, 2:50 AM

## 2024-03-29 NOTE — Procedures (Signed)
 Patient seen on Hemodialysis. BP (!) 140/73 (BP Location: Left Wrist)   Pulse 75   Temp 98.4 F (36.9 C) (Oral)   Resp 16   Ht 5' 3 (1.6 m)   Wt 64.4 kg   SpO2 98%   BMI 25.15 kg/m   QB 400, UF goal 2L Tolerating treatment without complaints at this time.   Gordy Blanch MD [facility]. Office # [phone] Pager # [phone] 11:31 AM

## 2024-03-29 NOTE — Progress Notes (Signed)
 The patient completed HD treatment without issue and goal met.  03/29/24 1441  Vitals  Temp 98.4 F (36.9 C)  Temp Source Oral  BP (!) 146/96  BP Location Left Wrist  BP Method Automatic  Patient Position (if appropriate) Lying  Pulse Rate 82  Pulse Rate Source Monitor  Resp 20  Weight 61.4 kg  Type of Weight Post-Dialysis  Oxygen Therapy  SpO2 100 %  O2 Device Room Air  During Treatment Monitoring  Intra-Hemodialysis Comments Tx completed  Post Treatment  Dialyzer Clearance Lightly streaked  Hemodialysis Intake (mL) 0 mL  Liters Processed 82  Fluid Removed (mL) 3000 mL  Tolerated HD Treatment Yes  Post-Hemodialysis Comments see notes.  AVG/AVF Arterial Site Held (minutes) 7 minutes  AVG/AVF Venous Site Held (minutes) 7 minutes  Fistula / Graft Right Upper arm Arteriovenous vein graft  Placement Date/Time: 07/06/22 1013   Placed prior to admission: No  Orientation: Right  Access Location: Upper arm  Access Type: Arteriovenous vein graft  Site Condition No complications  Fistula / Graft Assessment Present;Thrill;Bruit  Status Deaccessed  Needle Size 15  Drainage Description None

## 2024-03-29 NOTE — ED Provider Notes (Signed)
 1:55 AM Care assumed from [place], PA-C at shift change.  In short, patient transported to the emergency department for an episode of altered mental status.  Was found to have a CBG of 35 with EMS.  She was given glucagon  during transport.  Has been maintaining CBG levels since ED arrival.  Patient with leukocytosis of 22.2.  Unclear if leukocytosis may be secondary to the stress of hypoglycemic event.  She has had a leukocytosis in the past; however, typically not to this degree.  Her white blood cell count at time of hospital discharge on 03/24/2024 was 12.1.  Patient noted to be tachycardic while in the emergency department.  Question if this may be volume related; states she was last dialyzed on Tuesday prior to discharge.  Alleges that her dialysis chair was given to someone else today which is why she missed her session. She has a palpable thrill to her RUE fistula.  Lactate and blood cultures ordered given SIRS criteria.  However, currently no infectious source identified to suggest sepsis. Initial lactate reassuring so initiation of IVF deferred in light of hemodynamic stability and concern for degree of volume overload.  Concern for PNA on prior CXR appears to have resolved. Skin exam completed without evidence of cellulitis. No decubitus ulcers. UA ordered should patient produce urine.  2:18 AM Case discussed with Dr. Charlton of [facility] regarding observation pending preliminary culture results. Patient would benefit from inpatient dialysis. Dr. Charlton to assess patient in the ED for potential admission.   Keith Sor, PA-C 03/29/24 2204    Palumbo, April, MD 03/29/24 2317

## 2024-03-29 NOTE — Progress Notes (Signed)
 [name] Renal Consultation Note    Indication for Consultation:  Management of ESRD/hemodialysis, anemia, hypertension/volume, and secondary hyperparathyroidism.  HPI: Ashley Freeman is a 33 y.o. female with PMH including ESRD on dialysis, T2DM, HFrEF, HTN who presented to the ED with hypoglycemia. She reports her last dialysis was 03/24/24. She reports generalized body aches, fatigue, and chills. Reported BS would not come up despite drinking orange juice so EMS was called. CBG was 35 on arrival. In the ED, WBC was elevated and she was started on broad spectrum antibiotics. Bcx pending. She has labile BS at baseline and has multiple admissions this year, most recently discharged on 03/24/24 with acute on chronic CHF and recurrent pleural effusion. Today, she denies SOB. Reports abdominal pain and distention, no nausea at present. Denies fevers and chills today. Denies CP, palpitations, dizziness, edema. CXR showed no acute cardiopulmonary process. Labs notable for WBC 16, Hgb 11.3, K+ 5.3, BUN 40, Cr 7.54, CO2 18. Nephrology was consulted for management of ESRD>   Past Medical History:  Diagnosis Date   Abscess, gluteal, right 08/24/2013   Anemia 02/19/2012   Bartholin's gland abscess 09/19/2013   Bipolar disorder (HCC)    BV (bacterial vaginosis) 11/24/2015   Depression    Diabetes mellitus type I (HCC) 2001   Diagnosed at age 20 ; Type I   Diarrhea 05/30/2016   DKA (diabetic ketoacidoses) 08/19/2013   Also in 2018   ESRD (end stage renal disease) (HCC)    Gonorrhea 08/2011   Treated in 09/2011   HFrEF (heart failure with reduced ejection fraction) (HCC)    a. 2022 Echo: EF 40%; b. 10/2021 Echo: EF 55%; b. 07/2022 MV: No ischemia. EF 31%; c. 08/2022 Echo: EF 35%, mildly dil RV, sev TR.   History of trichomoniasis 05/31/2016   Hyperlipidemia 03/28/2016   Hypertension    NICM (nonischemic cardiomyopathy) (HCC)    Sepsis (HCC) 09/19/2013   Past Surgical  History:  Procedure Laterality Date   A/V FISTULAGRAM Right 06/17/2023   Procedure: A/V Fistulagram;  Surgeon: Marea Selinda GORMAN, MD;  Location: [facility] INVASIVE CV LAB;  Service: Cardiovascular;  Laterality: Right;   A/V SHUNT INTERVENTION N/A 09/25/2023   Procedure: A/V SHUNT INTERVENTION;  Surgeon: Pearline Norman GORMAN, MD;  Location: [facility] PV LAB;  Service: Cardiovascular;  Laterality: N/A;   AV FISTULA PLACEMENT Right 07/06/2022   Procedure: ARTERIOVENOUS GRAFT CREATION;  Surgeon: Gretta Lonni PARAS, MD;  Location: [facility] OR;  Service: Vascular;  Laterality: Right;   CESAREAN SECTION N/A 10/05/2019   Procedure: CESAREAN SECTION;  Surgeon: Izell Harari, MD;  Location: [facility] LD ORS;  Service: Obstetrics;  Laterality: N/A;   CHOLECYSTECTOMY N/A 07/02/2023   Procedure: LAPAROSCOPIC CHOLECYSTECTOMY;  Surgeon: Ebbie Cough, MD;  Location: [facility] OR;  Service: General;  Laterality: N/A;   ESOPHAGOGASTRODUODENOSCOPY N/A 01/31/2024   Procedure: EGD (ESOPHAGOGASTRODUODENOSCOPY);  Surgeon: San Sandor GAILS, DO;  Location: [facility] ENDOSCOPY;  Service: Gastroenterology;  Laterality: N/A;   INCISION AND DRAINAGE ABSCESS Left 09/28/2019   Procedure: INCISION AND DRAINAGE VULVAR ABCESS;  Surgeon: Edsel Norleen GAILS, MD;  Location: [facility] OR;  Service: Gynecology;  Laterality: Left;   INCISION AND DRAINAGE ABSCESS Right 02/23/2024   Procedure: INCISION AND DRAINAGE, ABSCESS;  Surgeon: Tobie Eldora NOVAK, MD;  Location: [facility] OR;  Service: ENT;  Laterality: Right;   INCISION AND DRAINAGE PERIRECTAL ABSCESS Right 08/18/2013   Procedure: IRRIGATION AND DEBRIDEMENT GLUTEAL ABSCESS;  Surgeon: Lynda Leos, MD;  Location: [facility] OR;  Service: General;  Laterality:  Right;   INCISION AND DRAINAGE PERIRECTAL ABSCESS Right 09/19/2013   Procedure: IRRIGATION AND DEBRIDEMENT RIGHT GLUTEAL AND LABIAL ABSCESSES;  Surgeon: Lynda Leos, MD;  Location: [facility] OR;  Service: General;  Laterality: Right;   INCISION AND DRAINAGE PERIRECTAL ABSCESS Right  09/24/2013   Procedure: IRRIGATION AND DEBRIDEMENT PERIRECTAL ABSCESS;  Surgeon: Lynwood MALVA Pina, MD;  Location: [facility] OR;  Service: General;  Laterality: Right;   IR PARACENTESIS  08/28/2023   IR PARACENTESIS  11/04/2023   IR PARACENTESIS  12/23/2023   IR PARACENTESIS  02/04/2024   IR PARACENTESIS  03/23/2024   Family History  Problem Relation Age of Onset   Asthma Mother    Carpal tunnel syndrome Mother    Gout Father    Diabetes Paternal Grandmother    Anesthesia problems Neg Hx    Social History:  reports that she has never smoked. She has never been exposed to tobacco smoke. She has never used smokeless tobacco. She reports that she does not currently use alcohol . She reports that she does not use drugs.  ROS: As per HPI otherwise negative  Physical Exam: Vitals:   03/29/24 0414 03/29/24 0459 03/29/24 0550 03/29/24 0741  BP: (!) 161/99 (!) 173/104 (!) 186/106 (!) 157/105  Pulse: (!) 112 (!) 108 (!) 107 98  Resp: (!) 22 (!) 23 (!) 22 17  Temp: 98.7 F (37.1 C) 98.6 F (37 C)  98.9 F (37.2 C)  TempSrc: Oral Oral  Oral  SpO2: 98% 94% 92% 96%  Weight:      Height:         General: Well developed, well nourished, in no acute distress. Head: Normocephalic, atraumatic, sclera non-icteric, mucus membranes are moist. Lungs: Clear bilaterally to auscultation without wheezes, rales, or rhonchi. Breathing is unlabored. Heart: RRR with normal S1, S2. No murmurs, rubs, or gallops appreciated. Abdomen: moderately distended, +BS Musculoskeletal:  Strength and tone appear normal for age. Lower extremities: No edema b/l lower extremities Neuro: Alert and oriented X 3. Moves all extremities spontaneously. Psych:  Responds to questions appropriately with a normal affect. Dialysis Access: AVG + t/b  Allergies  Allergen Reactions   Keflex  [Cephalexin ] Anaphylaxis    Ceftriaxone  in the past with no reaction   Penicillins Anaphylaxis, Hives and Rash   Vibramycin  [Doxycycline ] Anaphylaxis    Benadryl  [Diphenhydramine ] Itching   Dilaudid  [Hydromorphone ] Itching   Methotrexate And Trimetrexate Rash   Roxicodone  [Oxycodone ] Itching    Takes Percocet without issue   Prior to Admission medications   Medication Sig Start Date End Date Taking? Authorizing Provider  albuterol  (VENTOLIN  HFA) 108 (90 Base) MCG/ACT inhaler Inhale 2 puffs into the lungs every 4 (four) hours as needed for wheezing or shortness of breath. 11/24/22  Yes [provider]  bumetanide  (BUMEX ) 2 MG tablet Take 10 mg by mouth daily.   Yes [provider]  carvedilol  (COREG ) 25 MG tablet Take 25 mg by mouth 2 (two) times daily with a meal.   Yes [provider]  Cinnamon 500 MG capsule Take 500 mg by mouth in the morning.   Yes [provider]  DULoxetine  (CYMBALTA ) 20 MG capsule Take 20 mg by mouth daily. 07/29/23  Yes [provider]  Elderberry-Vitamin C-Zinc  (ELDERBERRY IMMUNE HEALTH GUMMY PO) Take 2 each by mouth in the morning.   Yes [provider]  hydrOXYzine  (ATARAX ) 25 MG tablet Take 25 mg by mouth 3 (three) times daily as needed for anxiety, itching, nausea or vomiting.   Yes  [provider]  lamoTRIgine  (LAMICTAL ) 200 MG tablet Take 1 tablet (200 mg total) by mouth daily. 10/29/23  Yes Regalado, Belkys A, MD  LANTUS  SOLOSTAR 100 UNIT/ML Solostar Pen Inject 10 Units into the skin daily. 02/07/24  Yes Drusilla Sabas RAMAN, MD  NOVOLOG  FLEXPEN 100 UNIT/ML FlexPen Inject 5 Units into the skin 3 (three) times daily with meals. 01/18/24  Yes [provider]  pantoprazole  (PROTONIX ) 40 MG tablet Take 1 tablet (40 mg total) by mouth 2 (two) times daily. 02/07/24 04/10/24 Yes Drusilla Sabas RAMAN, MD  prochlorperazine  (COMPAZINE ) 5 MG tablet Take 5 mg by mouth every 6 (six) hours as needed for nausea or vomiting.   Yes [provider]  rosuvastatin  (CRESTOR ) 40 MG tablet Take 40 mg by mouth daily. 01/20/24 01/19/25 Yes [provider]   sacubitril -valsartan  (ENTRESTO ) 24-26 MG Take 1 tablet by mouth 2 (two) times daily.   Yes [provider]  sevelamer  carbonate (RENVELA ) 800 MG tablet Take 1 tablet (800 mg total) by mouth 3 (three) times daily with meals. 02/07/24  Yes Drusilla Sabas RAMAN, MD  sodium bicarbonate  650 MG tablet Take 650 mg by mouth 2 (two) times daily. 07/29/23  Yes [provider]  SUMAtriptan  (IMITREX ) 50 MG tablet Take 50 mg by mouth every 2 (two) hours as needed for migraine or headache. 06/20/22  Yes [provider]  fluticasone  (FLONASE ) 50 MCG/ACT nasal spray Place 2 sprays into both nostrils daily as needed for allergies or rhinitis. 12/11/23 12/10/24  [provider]   Current Facility-Administered Medications  Medication Dose Route Frequency Provider Last Rate Last Admin   acetaminophen  (TYLENOL ) tablet 650 mg  650 mg Oral Q6H PRN Opyd, Timothy S, MD       Or   acetaminophen  (TYLENOL ) suppository 650 mg  650 mg Rectal Q6H PRN Opyd, Timothy S, MD       amLODipine  (NORVASC ) tablet 5 mg  5 mg Oral Daily Singh, Prashant K, MD       bumetanide  (BUMEX ) tablet 10 mg  10 mg Oral Daily Opyd, Timothy S, MD       carvedilol  (COREG ) tablet 25 mg  25 mg Oral BID WC Opyd, Timothy S, MD   25 mg at 03/29/24 0630   Chlorhexidine  Gluconate Cloth 2 % PADS 6 each  6 each Topical Q0600 Shaquelle Hernon G, PA-C       DULoxetine  (CYMBALTA ) DR capsule 20 mg  20 mg Oral Daily Opyd, Timothy S, MD       heparin  injection 5,000 Units  5,000 Units Subcutaneous Q8H Opyd, Timothy S, MD       hydrALAZINE  (APRESOLINE ) injection 10 mg  10 mg Intravenous Q6H PRN Singh, Prashant K, MD       hydrOXYzine  (ATARAX ) tablet 25 mg  25 mg Oral TID PRN Opyd, Timothy S, MD   25 mg at 03/29/24 0422   insulin  aspart (novoLOG ) injection 0-6 Units  0-6 Units Subcutaneous TID WC Opyd, Timothy S, MD       lamoTRIgine  (LAMICTAL ) tablet 200 mg  200 mg Oral Daily Opyd, Timothy S, MD       OLANZapine  zydis (ZYPREXA )  disintegrating tablet 5 mg  5 mg Oral QHS Opyd, Timothy S, MD       oxyCODONE  (Oxy IR/ROXICODONE ) immediate release tablet 5 mg  5 mg Oral Q4H PRN Opyd, Timothy S, MD       pantoprazole  (PROTONIX ) EC tablet 40 mg  40 mg Oral BID Opyd, Timothy S,  MD   40 mg at 03/29/24 0422   prochlorperazine  (COMPAZINE ) injection 5 mg  5 mg Intravenous Q6H PRN Opyd, Evalene RAMAN, MD       rosuvastatin  (CRESTOR ) tablet 40 mg  40 mg Oral Daily Opyd, Evalene RAMAN, MD       sacubitril -valsartan  (ENTRESTO ) 24-26 mg per tablet  1 tablet Oral BID Opyd, Evalene RAMAN, MD       sevelamer  carbonate (RENVELA ) tablet 800 mg  800 mg Oral TID WC Opyd, Evalene RAMAN, MD   800 mg at 03/29/24 0810   sodium chloride  flush (NS) 0.9 % injection 3 mL  3 mL Intravenous Q12H Charlton Evalene RAMAN, MD       Labs: Basic Metabolic Panel: Recent Labs  Lab 03/24/24 0547 03/28/24 2124 03/29/24 0623  NA 135 141 139  K 4.3 5.1 5.3*  CL 98 106 107  CO2 19* 23 18*  GLUCOSE 338* 247* 96  BUN 32* 36* 40*  CREATININE 6.33* 7.71* 7.54*  CALCIUM  8.0* 8.1* 8.3*   Liver Function Tests: Recent Labs  Lab 03/28/24 2124 03/29/24 0623  AST 120* 79*  ALT 27 31  ALKPHOS 365* 463*  BILITOT 1.0 0.9  PROT 4.9* 5.6*  ALBUMIN  2.6* 3.0*   No results for input(s): LIPASE, AMYLASE in the last 168 hours. No results for input(s): AMMONIA in the last 168 hours. CBC: Recent Labs  Lab 03/23/24 0619 03/24/24 0547 03/28/24 2124 03/29/24 0623  WBC 11.7* 12.1* 22.2* 16.0*  NEUTROABS 4.5  --  17.0*  --   HGB 12.2 10.5* 9.8* 11.3*  HCT 39.4 35.0* 33.5* 37.8  MCV 87.6 90.7 91.8 89.8  PLT 263 239 181 188   Cardiac Enzymes: No results for input(s): CKTOTAL, CKMB, CKMBINDEX, TROPONINI in the last 168 hours. CBG: Recent Labs  Lab 03/29/24 0056 03/29/24 0246 03/29/24 0739 03/29/24 0806 03/29/24 0826  GLUCAP 175* 138* 69* 69* 87   Iron  Studies: No results for input(s): IRON , TIBC, TRANSFERRIN, FERRITIN in the last 72  hours. Studies/Results: DG Chest Portable 1 View Result Date: 03/28/2024 CLINICAL DATA:  History of CHF.  Hypoglycemia. EXAM: PORTABLE CHEST 1 VIEW COMPARISON:  Chest x-ray 03/24/2024 FINDINGS: The heart is enlarged. Lung volumes are low. There is no focal lung infiltrate, pleural effusion or pneumothorax. No acute fractures are seen. IMPRESSION: Cardiomegaly. No acute cardiopulmonary process. Electronically Signed   By: Greig Pique M.D.   On: 03/28/2024 22:31    Dialysis Orders:   [facility] TTS AVG  Heparin  1600 +600/hr  Assessment/Plan:  Hypoglycemia: Labile BS. WBC also elevated and on empiric antibiotics. Per PMD  ESRD:  Last dialysis was 11/25. Will plan for dialysis either today or this evening, HD Rns are coordinating timing.   Hypertension/volume: BP elevated, no evidence of volume overload on CXR but does have recurrent ascites. UF goal 3L today, consider paracentesis while admitted.  Anemia: Hgb at goal, ESA not indicated at this time.   Metabolic bone disease: Calcium  controlled, follow phos  Nutrition:  Will need renal diet and fluid restrictins  Lucie Collet, PA-C 03/29/2024, 8:39 AM   [facility] Pager: [phone]

## 2024-03-30 DIAGNOSIS — R6510 Systemic inflammatory response syndrome (SIRS) of non-infectious origin without acute organ dysfunction: Secondary | ICD-10-CM | POA: Diagnosis not present

## 2024-03-30 DIAGNOSIS — Z91158 Patient's noncompliance with renal dialysis for other reason: Secondary | ICD-10-CM | POA: Diagnosis not present

## 2024-03-30 DIAGNOSIS — Z91148 Patient's other noncompliance with medication regimen for other reason: Secondary | ICD-10-CM

## 2024-03-30 LAB — CBC
HCT: 33.1 % — ABNORMAL LOW (ref 36.0–46.0)
Hemoglobin: 9.9 g/dL — ABNORMAL LOW (ref 12.0–15.0)
MCH: 26.8 pg (ref 26.0–34.0)
MCHC: 29.9 g/dL — ABNORMAL LOW (ref 30.0–36.0)
MCV: 89.7 fL (ref 80.0–100.0)
Platelets: 165 K/uL (ref 150–400)
RBC: 3.69 MIL/uL — ABNORMAL LOW (ref 3.87–5.11)
RDW: 15.8 % — ABNORMAL HIGH (ref 11.5–15.5)
WBC: 10.7 K/uL — ABNORMAL HIGH (ref 4.0–10.5)
nRBC: 0 % (ref 0.0–0.2)

## 2024-03-30 LAB — COMPREHENSIVE METABOLIC PANEL WITH GFR
ALT: 23 U/L (ref 0–44)
AST: 50 U/L — ABNORMAL HIGH (ref 15–41)
Albumin: 2.6 g/dL — ABNORMAL LOW (ref 3.5–5.0)
Alkaline Phosphatase: 389 U/L — ABNORMAL HIGH (ref 38–126)
Anion gap: 12 (ref 5–15)
BUN: 29 mg/dL — ABNORMAL HIGH (ref 6–20)
CO2: 27 mmol/L (ref 22–32)
Calcium: 8.3 mg/dL — ABNORMAL LOW (ref 8.9–10.3)
Chloride: 99 mmol/L (ref 98–111)
Creatinine, Ser: 5.84 mg/dL — ABNORMAL HIGH (ref 0.44–1.00)
GFR, Estimated: 9 mL/min — ABNORMAL LOW (ref >60)
Glucose, Bld: 386 mg/dL — ABNORMAL HIGH (ref 70–99)
Potassium: 4.8 mmol/L (ref 3.5–5.1)
Sodium: 138 mmol/L (ref 135–145)
Total Bilirubin: 1.1 mg/dL (ref 0.0–1.2)
Total Protein: 4.9 g/dL — ABNORMAL LOW (ref 6.5–8.1)

## 2024-03-30 LAB — HEMOGLOBIN A1C
Hgb A1c MFr Bld: 8.5 % — ABNORMAL HIGH (ref 4.8–5.6)
Mean Plasma Glucose: 197 mg/dL

## 2024-03-30 LAB — GLUCOSE, CAPILLARY
Glucose-Capillary: 397 mg/dL — ABNORMAL HIGH (ref 70–99)
Glucose-Capillary: 408 mg/dL — ABNORMAL HIGH (ref 70–99)
Glucose-Capillary: 325 mg/dL — ABNORMAL HIGH (ref 70–99)

## 2024-03-30 LAB — CYTOLOGY - NON PAP

## 2024-03-30 MED ORDER — INSULIN ASPART 100 UNIT/ML IJ SOLN
8.0000 [IU] | Freq: Once | INTRAMUSCULAR | Status: AC
  Administered 2024-03-30: 8 [IU] via SUBCUTANEOUS
  Filled 2024-03-30: qty 8

## 2024-03-30 MED ORDER — LANTUS SOLOSTAR 100 UNIT/ML ~~LOC~~ SOPN: 5.0000 [IU] | PEN_INJECTOR | Freq: Every day | SUBCUTANEOUS | Status: DC

## 2024-03-30 MED ORDER — INSULIN GLARGINE-YFGN 100 UNIT/ML ~~LOC~~ SOLN
5.0000 [IU] | Freq: Every day | SUBCUTANEOUS | Status: DC
  Administered 2024-03-30: 5 [IU] via SUBCUTANEOUS
  Filled 2024-03-30: qty 0.05

## 2024-03-30 MED ORDER — NOVOLOG FLEXPEN RELION 100 UNIT/ML ~~LOC~~ SOPN: PEN_INJECTOR | SUBCUTANEOUS | Status: DC

## 2024-03-30 NOTE — Progress Notes (Signed)
 [facility]  PROCEDURAL EXPEDITER PROGRESS NOTE  Patient Name: Ashley Freeman  DOB:1991-01-27 Date of Admission: 03/28/2024  Date of Assessment:03/30/24   ------------------------------------------------------------------------------------------------------------------- Pt CBG and blood pressure elevated will recheck at 11 am.  Pt received BP meds -------------------------------------------------------------------------------------------------------------------  [facility] Expediter, Ronal DELENA Bald Please contact us  directly via secure chat (search for [facility]) or by calling us  at [phone] [facility]).

## 2024-03-30 NOTE — Plan of Care (Signed)
  Problem: Coping: Goal: Ability to adjust to condition or change in health will improve Outcome: Progressing   Problem: Health Behavior/Discharge Planning: Goal: Ability to manage health-related needs will improve Outcome: Progressing   Problem: Clinical Measurements: Goal: Will remain free from infection Outcome: Progressing Goal: Respiratory complications will improve Outcome: Progressing   Problem: Coping: Goal: Level of anxiety will decrease Outcome: Progressing   Problem: Pain Managment: Goal: General experience of comfort will improve and/or be controlled Outcome: Progressing

## 2024-03-30 NOTE — Discharge Instructions (Signed)
 Kindly remain compliant with your Accu-Cheks q. ACHS, with your diet/fluid restriction and dialysis schedule.  Please take your insulin  as you are supposed to.  Follow with Primary MD [name], [name], MD in 7 days   Get CBC, CMP, Magnesium , 2 view Chest X ray -  checked next visit with your primary MD    Activity: As tolerated with Full fall precautions use walker/cane & assistance as needed  Disposition Home    Diet: Renal-low carbohydrate diet, strict 1.2 L fluid restriction per day.  Monitor your CBGs q. ACHS.  Use your glucometer/Dexcom as instructed.  Accuchecks 4 times/day, Once in AM empty stomach and then before each meal. Log in all results and show them to your Prim.MD in 3 days. If any glucose reading is under 80 or above 300 call your Prim MD immidiately. Follow Low glucose instructions for glucose under 80 as instructed.  Special Instructions: If you have smoked or chewed Tobacco  in the last 2 yrs please stop smoking, stop any regular Alcohol   and or any Recreational drug use.  On your next visit with your primary care physician please Get Medicines reviewed and adjusted.  Please request your Prim.MD to go over all Hospital Tests and Procedure/Radiological results at the follow up, please get all Hospital records sent to your Prim MD by signing hospital release before you go home.  If you experience worsening of your admission symptoms, develop shortness of breath, life threatening emergency, suicidal or homicidal thoughts you must seek medical attention immediately by calling 911 or calling your MD immediately  if symptoms less severe.  You Must read complete instructions/literature along with all the possible adverse reactions/side effects for all the Medicines you take and that have been prescribed to you. Take any new Medicines after you have completely understood and accpet all the possible adverse reactions/side effects.   Do not drive when taking Pain medications.   Do not take more than prescribed Pain, Sleep and Anxiety Medications  Wear Seat belts while driving.   Please note  You were cared for by a hospitalist during your hospital stay. If you have any questions about your discharge medications or the care you received while you were in the hospital after you are discharged, you can call the unit and asked to speak with the hospitalist on call if the hospitalist that took care of you is not available. Once you are discharged, your primary care physician will handle any further medical issues. Please note that NO REFILLS for any discharge medications will be authorized once you are discharged, as it is imperative that you return to your primary care physician (or establish a relationship with a primary care physician if you do not have one) for your aftercare needs so that they can reassess your need for medications and monitor your lab values.

## 2024-03-30 NOTE — Progress Notes (Signed)
  [facility] Progress Note   Subjective:  Completed dialysis yesterday - 3L UF.  Seen in room. No complaints. Denies cp, sob. Feels better and planning to go home today. Says she has transportation to dialysis tomorrow.   Objective Vitals:   03/30/24 0315 03/30/24 0605 03/30/24 0750 03/30/24 0949  BP: 107/87   (!) 181/101  Pulse: 87   84  Resp: (!) 21   15  Temp: 98.1 F (36.7 C)  97.7 F (36.5 C)   TempSrc: Oral  Oral   SpO2: 94% 97%  90%  Weight:      Height:          Additional Objective Labs: Basic Metabolic Panel: Recent Labs  Lab 03/29/24 0623 03/29/24 1129 03/30/24 0334  NA 139 140 138  K 5.3* 5.4* 4.8  CL 107 108 99  CO2 18* 20* 27  GLUCOSE 96 89 386*  BUN 40* 40* 29*  CREATININE 7.54* 7.62* 5.84*  CALCIUM  8.3* 8.1* 8.3*  PHOS  --  9.1*  --    CBC: Recent Labs  Lab 03/24/24 0547 03/28/24 2124 03/29/24 0623 03/29/24 1129 03/30/24 0334  WBC 12.1* 22.2* 16.0* 13.3* 10.7*  NEUTROABS  --  17.0*  --   --   --   HGB 10.5* 9.8* 11.3* 9.8* 9.9*  HCT 35.0* 33.5* 37.8 32.9* 33.1*  MCV 90.7 91.8 89.8 89.9 89.7  PLT 239 181 188 163 165   Blood Culture    Component Value Date/Time   SDES BLOOD LEFT ANTECUBITAL 03/29/2024 0030   SPECREQUEST  03/29/2024 0030    BOTTLES DRAWN AEROBIC AND ANAEROBIC Blood Culture adequate volume   CULT  03/29/2024 0030    NO GROWTH 1 DAY Performed at [facility], [address], [place], [place] [address]    REPTSTATUS PENDING 03/29/2024 0030     Physical Exam General: Alert, nad Heart: RRR Lungs: Clear Abdomen: non-tender Extremities: trace LE edema Dialysis Access: AVG   Medications:   amLODipine   5 mg Oral Daily   bumetanide   10 mg Oral Daily   carvedilol   25 mg Oral BID WC   Chlorhexidine  Gluconate Cloth  6 each Topical Q0600   dextrose   2 Tube Oral STAT   DULoxetine   20 mg Oral Daily   heparin   5,000 Units Subcutaneous Q8H   insulin  aspart  0-6 Units Subcutaneous TID WC   insulin   glargine-yfgn  5 Units Subcutaneous Daily   lamoTRIgine   200 mg Oral Daily   OLANZapine  zydis  5 mg Oral QHS   pantoprazole   40 mg Oral BID   rosuvastatin   40 mg Oral Daily   sacubitril -valsartan   1 tablet Oral BID   sevelamer  carbonate  800 mg Oral TID WC   sodium chloride  flush  3 mL Intravenous Q12H    Dialysis Orders:   [facility] TTS AVG  Heparin  1600 +600/hr   Assessment/Plan:  SIRS:  Labile BS. WBC also elevated. Received empiric antibiotics. Bcx ngtd. Per PMD  ESRD: HD TTS. Missed outpatient HD. Dialyzied 11/30 here. No acute HD indications today. Next HD Tues.   Hypertension/volume: BP elevated, no evidence of volume overload on CXR but does have recurrent ascites.   Anemia: Hgb at goal, ESA not indicated at this time.   Metabolic bone disease: Calcium  controlled, follow phos  Nutrition:  Will need renal diet and fluid restrictins     Maisie Ronnald Acosta PA-C [facility] 03/30/2024,10:39 AM

## 2024-03-30 NOTE — Progress Notes (Signed)
 Patient's CBG is 325.Dr Dennise instructed to give 2u of insulin  before the patient leaves,Primary RN attempted to administer but the patient refused to take the insulin  and left the unit.

## 2024-03-30 NOTE — TOC Transition Note (Signed)
 Transition of Care [facility]) - Discharge Note   Patient Details  Name: Ashley Freeman MRN: [id number] Date of Birth: 12/02/1990  Transition of Care [facility] CM/SW Contact:  Landry DELENA Senters, RN Phone Number: 03/30/2024, 8:20 AM   Clinical Narrative:     Patient will be discharging home today, with transportation in place.   No further needs identified by CM.   Final next level of care: Home/Self Care Barriers to Discharge: No Barriers Identified   Patient Goals and CMS Choice            Discharge Placement                       Discharge Plan and Services Additional resources added to the After Visit Summary for                                       Social Drivers of Health (SDOH) Interventions SDOH Screenings   Food Insecurity: No Food Insecurity (03/29/2024)  Housing: Low Risk  (03/29/2024)  Transportation Needs: No Transportation Needs (03/29/2024)  Utilities: Not At Risk (03/29/2024)  Depression (PHQ2-9): Medium Risk (03/02/2020)  Financial Resource Strain: Medium Risk (11/15/2023)   [facility]  Physical Activity: Insufficiently Active (03/02/2022)   Received from [facility]  Social Connections: Moderately Integrated (02/04/2024)  Stress: No Stress Concern Present (03/08/2023)   Received from [facility]  Tobacco Use: Low Risk  (03/29/2024)     Readmission Risk Interventions    03/23/2024    2:19 PM 02/06/2024    2:01 PM 11/28/2023   11:49 AM  Readmission Risk Prevention Plan  Transportation Screening Complete Complete Complete  Medication Review Oceanographer) Complete Referral to Pharmacy Complete  PCP or Specialist appointment within 3-5 days of discharge Complete Complete Complete  HRI or Home Care Consult Complete Complete Complete  SW Recovery Care/Counseling Consult  Complete   Palliative Care Screening Not Applicable Not Applicable Not Applicable  Skilled Nursing Facility Not Applicable Not  Applicable Not Applicable

## 2024-03-30 NOTE — Progress Notes (Signed)
 SATURATION QUALIFICATIONS: (This note is used to comply with regulatory documentation for home oxygen)  Patient Saturations on Room Air at Rest = 92%  Patient Saturations on Room Air while Ambulating = 90%  Patient Saturations on 2 Liters of oxygen while Ambulating = 94%  Patient ambulated around the room in no distress Patient walking independently on room air tolerated well

## 2024-03-30 NOTE — Progress Notes (Signed)
 Patient sats sustaining in between 84-88% in RA while sleeping so, placed on 2L [place] for now.

## 2024-03-30 NOTE — Plan of Care (Signed)

## 2024-03-30 NOTE — Inpatient Diabetes Management (Signed)
 Inpatient Diabetes Program Recommendations  AACE/ADA: New Consensus Statement on Inpatient Glycemic Control (2015)  Target Ranges:  Prepandial:   less than 140 mg/dL      Peak postprandial:   less than 180 mg/dL (1-2 hours)      Critically ill patients:  140 - 180 mg/dL   Lab Results  Component Value Date   GLUCAP 325 (H) 03/30/2024   HGBA1C 10.8 (H) 12/20/2023    Review of Glycemic Control  Latest Reference Range & Units 03/30/24 08:14 03/30/24 09:30 03/30/24 11:01  Glucose-Capillary 70 - 99 mg/dL 602 (H)  Novolog  2 units @ 0832 408 (H)  Novolog  8 units @ 1000  325 (H)   Diabetes history: DM 1 Outpatient Diabetes medications:  Lantus  10 units daily Novolog  5 units tid with meals  Current orders for Inpatient glycemic control:  Novolog  0-6 units tid with meals Semglee  5 units daily Inpatient Diabetes Program Recommendations:    Note that CBG's increased this morning. Patient did not receive short or long acting insulin  on 11/30 due to presenting with hypoglycemia.  Spoke to patient at bedside.  She states that she is wearing Dexcom sensor and knows that her basal insulin  dose will be reduced at discharge.   We briefly discussed potential causes of low blood sugar/hypoglycemia on admit, but she states she is unsure what happened or why.   She used to have prescription for Baqsimi  (intranasal glucagon  but states she does not have anymore).  Will see if she can get a new prescription?  Patient has received a total of 10 units of Novolog  this AM plus Semglee  5 units.  May consider holding lunch time dose of Novolog  to prevent stacking (Last dose of Novolog  was at 1000 am).   Thanks,  Randall Bullocks, RN, BC-ADM Inpatient Diabetes Coordinator Pager [phone]  ([id number])

## 2024-03-30 NOTE — Discharge Summary (Signed)
 Discharge summary note.  Ashley Freeman FMW:[id number] DOB: 10-Oct-1990 DOA: 03/28/2024  PCP: Keven Crumbly Pap, MD  Admit date: 03/28/2024  Discharge date: 03/30/2024  Admitted From: Home   Disposition:  Home   Recommendations for Outpatient Follow-up:   Follow up with PCP in 1-2 weeks  PCP Please obtain BMP/CBC, 2 view CXR in 1week,  (see Discharge instructions)   PCP Please follow up on the following pending results: Monitor CBGs, check CBC in 5 to 7 days.   Home Health: None Equipment/Devices: None  Consultations: Renal Discharge Condition: Stable    CODE STATUS: Full    Diet Recommendation: Renal-low carbohydrate diet with strict 1.2 L fluid restriction per day    Chief Complaint  Patient presents with   Hypoglycemia     Brief history of present illness from the day of admission and additional interim summary    33 y.o. female with medical history significant for ESRD on hemodialysis, chronic HFrEF, type 1 diabetes mellitus, hypertension, long history of noncompliance with HD treatments and insulin  regimen, bipolar disorder who was brought into the ED with hypoglycemia.   Patient reports that she was last dialyzed on 03/24/2024.  She had been experiencing some generalized aches and fatigue which she attributes to missing dialysis, but had otherwise been in her usual state until she developed anxiety and shakes last night at roughly 8 PM.  She continued to feel worse despite drinking orange juice and her mother called EMS.  CBG was 35 on the scene, patient had decreased LOC, was treated with glucagon , and brought into the ED, of note patient has had long history of noncompliance with both her HD treatments and her diet and insulin  regimen, she is also not checking her sugars on a daily  basis for several weeks.                                                                 Hospital Course    1. SIRS - HR and WBC elevated in ED; blood cultures were collected and she was given broad-spectrum antibiotics  - No infectious process identified, she was monitored off of antibiotics after admission, no further fevers, WBC trending down, nontoxic-appearing, symptom-free sleeping in bed in no distress.  No source of infection.  PCP to monitor final culture results which are still pending.  Currently no source of infection.   2. Hypoglycemia; type I DM  - A1c was 10.8% in August 2025  - CBG was 6 with EMS and she was treated with glucagon  prior to arrival  - Of note patient is very noncompliant with her diabetic regimen for several months, she has not checked her CBGs on a regular basis for several months, has a Dexcom meter which she stopped using few months ago and says  she is now thinking about starting to use it again, she takes her insulin  once every 3 to 4 days according to her, has supplies but just does not take it, strictly counseled.  Also her oral intake is highly erratic, mild permissive hyperglycemia right now is safer in her setting, have requested her to check CBGs q. ACHS maintain a logbook ensured to PCP within a week, PCP to monitor CBGs and adjust insulin  regimen as appropriate.  For now I have adjusted her insulin  regimen guided towards safe levels, of note she is highly erratic in her diet and does not check her CBGs and does not take insulin  for days, strictly counseled.   CBG (last 3)  Recent Labs    03/29/24 1601 03/29/24 1636 03/29/24 2121  GLUCAP 44* 152* 232*   Lab Results  Component Value Date   HGBA1C 10.8 (H) 12/20/2023      3. ESRD  - Last HD was 11/25 per patient report, patient has longstanding history of noncompliance with HD treatments.  Today she tells me there is no good reason why she had could not go to HD says that she did not feel like  going, again strictly counseled had HD treatment yesterday discussed with nephrology stable for discharge.  Counseled on compliance with HD diet and fluid restriction     4. Chronic HFrEF  - EF was 30-35% in May 2025  - Continue Bumex , Coreg , and Entresto     5. Ascites; elevated LFTs  - She underwent paracentesis on 03/13/24 with 6 liters off, SAAG supports portal hypertension as etiology - She was referred to GI last admission a few days ago but has not yet been seen, requested to keep her appointment, ascites likely due to third spacing from the HD treatments and fluid overload.   6. Bipolar disorder  - Continue Zyprexa , Lamictal , Cymbalta     7. HTN - Continue Bumex , Coreg , and Entresto , counseled on compliance with blood pressure medications.  Discharge diagnosis     Principal Problem:   SIRS (systemic inflammatory response syndrome) (HCC) Active Problems:   ESRD on hemodialysis (HCC)   Bipolar disorder (HCC)   Type 1 diabetes mellitus with chronic kidney disease on chronic dialysis (HCC)   Hypoglycemia   Acute on chronic HFrEF (heart failure with reduced ejection fraction) (HCC)   Elevated LFTs    Discharge instructions    Discharge Instructions     Discharge instructions   Complete by: As directed    Kindly remain compliant with your Accu-Cheks q. ACHS, with your diet/fluid restriction and dialysis schedule.  Please take your insulin  as you are supposed to.  Follow with Primary MD [name], [name], MD in 7 days   Get CBC, CMP, Magnesium , 2 view Chest X ray -  checked next visit with your primary MD    Activity: As tolerated with Full fall precautions use walker/cane & assistance as needed  Disposition Home    Diet: Renal-low carbohydrate diet, strict 1.2 L fluid restriction per day.  Monitor your CBGs q. ACHS.  Use your glucometer/Dexcom as instructed.  Accuchecks 4 times/day, Once in AM empty stomach and then before each meal. Log in all results and show  them to your Prim.MD in 3 days. If any glucose reading is under 80 or above 300 call your Prim MD immidiately. Follow Low glucose instructions for glucose under 80 as instructed.  Special Instructions: If you have smoked or chewed Tobacco  in the last 2 yrs please stop smoking,  stop any regular Alcohol   and or any Recreational drug use.  On your next visit with your primary care physician please Get Medicines reviewed and adjusted.  Please request your Prim.MD to go over all Hospital Tests and Procedure/Radiological results at the follow up, please get all Hospital records sent to your Prim MD by signing hospital release before you go home.  If you experience worsening of your admission symptoms, develop shortness of breath, life threatening emergency, suicidal or homicidal thoughts you must seek medical attention immediately by calling 911 or calling your MD immediately  if symptoms less severe.  You Must read complete instructions/literature along with all the possible adverse reactions/side effects for all the Medicines you take and that have been prescribed to you. Take any new Medicines after you have completely understood and accpet all the possible adverse reactions/side effects.   Do not drive when taking Pain medications.  Do not take more than prescribed Pain, Sleep and Anxiety Medications  Wear Seat belts while driving.   Please note  You were cared for by a hospitalist during your hospital stay. If you have any questions about your discharge medications or the care you received while you were in the hospital after you are discharged, you can call the unit and asked to speak with the hospitalist on call if the hospitalist that took care of you is not available. Once you are discharged, your primary care physician will handle any further medical issues. Please note that NO REFILLS for any discharge medications will be authorized once you are discharged, as it is imperative that you  return to your primary care physician (or establish a relationship with a primary care physician if you do not have one) for your aftercare needs so that they can reassess your need for medications and monitor your lab values.   Increase activity slowly   Complete by: As directed    No wound care   Complete by: As directed        Discharge Medications   Allergies as of 03/30/2024       Reactions   Keflex  [cephalexin ] Anaphylaxis   Ceftriaxone  in the past with no reaction   Penicillins Anaphylaxis, Hives, Rash   Vibramycin  [doxycycline ] Anaphylaxis   Benadryl  [diphenhydramine ] Itching   Dilaudid  [hydromorphone ] Itching   Methotrexate And Trimetrexate Rash   Roxicodone  [oxycodone ] Itching   Takes Percocet without issue        Medication List     TAKE these medications    albuterol  108 (90 Base) MCG/ACT inhaler Commonly known as: VENTOLIN  HFA Inhale 2 puffs into the lungs every 4 (four) hours as needed for wheezing or shortness of breath.   bumetanide  2 MG tablet Commonly known as: BUMEX  Take 10 mg by mouth daily.   carvedilol  25 MG tablet Commonly known as: COREG  Take 25 mg by mouth 2 (two) times daily with a meal.   Cinnamon 500 MG capsule Take 500 mg by mouth in the morning.   DULoxetine  20 MG capsule Commonly known as: CYMBALTA  Take 20 mg by mouth daily.   ELDERBERRY IMMUNE HEALTH GUMMY PO Take 2 each by mouth in the morning.   Entresto  24-26 MG Generic drug: sacubitril -valsartan  Take 1 tablet by mouth 2 (two) times daily.   fluticasone  50 MCG/ACT nasal spray Commonly known as: FLONASE  Place 2 sprays into both nostrils daily as needed for allergies or rhinitis.   hydrOXYzine  25 MG tablet Commonly known as: ATARAX  Take 25 mg by mouth 3 (three)  times daily as needed for anxiety, itching, nausea or vomiting.   lamoTRIgine  200 MG tablet Commonly known as: LAMICTAL  Take 1 tablet (200 mg total) by mouth daily.   Lantus  SoloStar 100 UNIT/ML Solostar  Pen Generic drug: insulin  glargine Inject 5 Units into the skin daily. What changed: how much to take   NovoLOG  FlexPen 100 UNIT/ML FlexPen Generic drug: insulin  aspart Before each meal 3 times a day, 140-199 - 2 units, 200-250 - 4 units, 251-299 - 6 units,  300-349 - 7 units,  350 or above 8 units. What changed:  how much to take how to take this when to take this additional instructions   pantoprazole  40 MG tablet Commonly known as: PROTONIX  Take 1 tablet (40 mg total) by mouth 2 (two) times daily.   prochlorperazine  5 MG tablet Commonly known as: COMPAZINE  Take 5 mg by mouth every 6 (six) hours as needed for nausea or vomiting.   rosuvastatin  40 MG tablet Commonly known as: CRESTOR  Take 40 mg by mouth daily.   sevelamer  carbonate 800 MG tablet Commonly known as: RENVELA  Take 1 tablet (800 mg total) by mouth 3 (three) times daily with meals.   sodium bicarbonate  650 MG tablet Take 650 mg by mouth 2 (two) times daily.   SUMAtriptan  50 MG tablet Commonly known as: IMITREX  Take 50 mg by mouth every 2 (two) hours as needed for migraine or headache.         Follow-up Information     [name], [name], MD.   Specialty: Family Medicine Contact information: [address] [place] [address] [phone]                 Major procedures and Radiology Reports - PLEASE review detailed and final reports thoroughly  -       DG Chest Portable 1 View Result Date: 03/28/2024 CLINICAL DATA:  History of CHF.  Hypoglycemia. EXAM: PORTABLE CHEST 1 VIEW COMPARISON:  Chest x-ray 03/24/2024 FINDINGS: The heart is enlarged. Lung volumes are low. There is no focal lung infiltrate, pleural effusion or pneumothorax. No acute fractures are seen. IMPRESSION: Cardiomegaly. No acute cardiopulmonary process. Electronically Signed   By: Greig Pique M.D.   On: 03/28/2024 22:31   DG Chest Port 1 View Result Date: 03/24/2024 EXAM: 1 VIEW(S) XRAY OF THE CHEST 03/24/2024 06:43:00 PM  COMPARISON: 03/23/2024 CLINICAL HISTORY: Shortness of breath FINDINGS: LUNGS AND PLEURA: Improved diffuse airspace opacities. Increased retrocardiac opacity. Small pleural effusions. No pneumothorax. HEART AND MEDIASTINUM: Similar cardiomegaly. Partially visualized right arm vascular stent. BONES AND SOFT TISSUES: No acute osseous abnormality. IMPRESSION: 1. Improved increased aeration compared to prior radiograph. 2. Increased retrocardiac opacity, atelectasis versus pneumonia . 3. Stable  cardiomegaly with Small pleural effusions. Electronically signed by: Luke Bun MD 03/24/2024 07:08 PM EST RP Workstation: [id number]   IR Paracentesis Result Date: 03/23/2024 INDICATION: History of end-stage renal disease on hemodialysis with recurrent ascites. Request for therapeutic paracentesis. EXAM: ULTRASOUND GUIDED THERAPEUTIC PARACENTESIS MEDICATIONS: 8 mL 1% lidocaine  COMPLICATIONS: None immediate. PROCEDURE: Informed written consent was obtained from the patient after a discussion of the risks, benefits and alternatives to treatment. A timeout was performed prior to the initiation of the procedure. Initial ultrasound scanning demonstrates a moderate amount of ascites within the right lower abdominal quadrant. The right lower abdomen was prepped and draped in the usual sterile fashion. 1% lidocaine  was used for local anesthesia. Following this, a 19 gauge, 7-cm, Yueh catheter was introduced. An ultrasound image was saved for  documentation purposes. The paracentesis was performed. The catheter was removed and a dressing was applied. The patient tolerated the procedure well without immediate post procedural complication. Patient received post-procedure intravenous albumin ; see nursing notes for details. FINDINGS: A total of approximately 6.2 liters of hazy yellow fluid was removed. IMPRESSION: Successful ultrasound-guided paracentesis yielding 6.2 liters of peritoneal fluid. Performed by: Wyatt Pommier, PA-C  Electronically Signed   By: JONETTA Faes M.D.   On: 03/23/2024 16:25   DG Chest Port 1 View Result Date: 03/23/2024 EXAM: 1 VIEW(S) XRAY OF THE CHEST 03/23/2024 12:30:00 AM COMPARISON: 03/21/2024. CLINICAL HISTORY: SOB (shortness of breath). FINDINGS: LUNGS AND PLEURA: Worsening bilaterally airspace disease could reflect edema or infection. Suspect small right pleural effusion. Low lung volumes. No pneumothorax. HEART AND MEDIASTINUM: Cardiomegaly. BONES AND SOFT TISSUES: No acute osseous abnormality. IMPRESSION: 1. Worsening bilateral airspace disease, possibly reflecting edema or infection. 2. Suspected small right pleural effusion. 3. Cardiomegaly. Electronically signed by: Franky Crease MD 03/23/2024 12:42 AM EST RP Workstation: [id number]   CT ABDOMEN PELVIS WO CONTRAST Result Date: 03/22/2024 EXAM: CT ABDOMEN AND PELVIS WITHOUT CONTRAST 03/22/2024 01:57:31 AM TECHNIQUE: CT of the abdomen and pelvis was performed without the administration of intravenous contrast. Multiplanar reformatted images are provided for review. Automated exposure control, iterative reconstruction, and/or weight-based adjustment of the mA/kV was utilized to reduce the radiation dose to as low as reasonably achievable. COMPARISON: 03/08/2024 CLINICAL HISTORY: Distended abdomen. FINDINGS: LOWER CHEST: Moderate right-sided pleural effusion is noted, slightly increased when compared with the prior exam. LIVER: The liver is within normal limits. GALLBLADDER AND BILE DUCTS: The gallbladder has been surgically removed. No biliary ductal dilatation. SPLEEN: The spleen is unremarkable. PANCREAS: The pancreas is unremarkable. ADRENAL GLANDS: The adrenal glands are well visualized without acute abnormality. KIDNEYS, URETERS AND BLADDER: The kidneys are well visualized without acute abnormality. Renal vascular calcifications are seen. The bladder is partially distended. No stones in the kidneys or ureters. GI AND BOWEL: Stomach and small bowel  are within normal limits. No obstructive or inflammatory changes of the colon are seen. The appendix is not well visualized and may have been surgically removed. No inflammatory changes to suggest appendicitis are seen. PERITONEUM AND RETROPERITONEUM: Moderate to severe ascites is noted, increased when compared with the prior exam. No free air. VASCULATURE: Diffuse vascular calcifications are noted in the visceral vessels. The aorta shows no aneurysmal dilatation. LYMPH NODES: No lymphadenopathy. REPRODUCTIVE ORGANS: The uterus and adnexa are within normal limits. BONES AND SOFT TISSUES: Changes of anasarca are noted in the subcutaneous tissues. IMPRESSION: 1. Moderate to severe ascites, increased compared to the prior exam. 2. Moderate right pleural effusion, slightly increased compared to the prior exam. Electronically signed by: Oneil Devonshire MD 03/22/2024 02:03 AM EST RP Workstation: [id number]   DG Chest Portable 1 View Result Date: 03/21/2024 EXAM: 1 VIEW(S) XRAY OF THE CHEST 03/21/2024 07:26:00 PM COMPARISON: 03/16/2024 CLINICAL HISTORY: Shortness of breath FINDINGS: LUNGS AND PLEURA: Low lung volumes. Airspace disease in the right lower lobe concerning for pneumonia. No pleural effusion. No pneumothorax. HEART AND MEDIASTINUM: Cardiomegaly. BONES AND SOFT TISSUES: No acute osseous abnormality. IMPRESSION: 1. Airspace disease in the right lower lobe concerning for pneumonia. 2. Cardiomegaly. Electronically signed by: Franky Crease MD 03/21/2024 07:33 PM EST RP Workstation: [id number]   DG Chest Portable 1 View Result Date: 03/16/2024 CLINICAL DATA:  Chest pain, hyperglycemia. EXAM: PORTABLE CHEST 1 VIEW COMPARISON:  03/08/2024 and CT chest 07/15/2023. FINDINGS: Trachea is midline. Heart is enlarged. Lungs are  low in volume with mild interstitial prominence and indistinctness. No pleural fluid. A vascular stent is partially imaged in the proximal right upper arm. IMPRESSION: Mild pulmonary edema.  Electronically Signed   By: Newell Eke M.D.   On: 03/16/2024 17:44   CT ABDOMEN PELVIS WO CONTRAST Result Date: 03/08/2024 EXAM: CT ABDOMEN AND PELVIS WITHOUT CONTRAST 03/08/2024 08:45:12 AM TECHNIQUE: CT of the abdomen and pelvis was performed without the administration of intravenous contrast. Multiplanar reformatted images are provided for review. Automated exposure control, iterative reconstruction, and/or weight-based adjustment of the mA/kV was utilized to reduce the radiation dose to as low as reasonably achievable. COMPARISON: Similar to the previous exam. CLINICAL HISTORY: Abdominal pain, acute, nonlocalized. FINDINGS: LOWER CHEST: Heterogeneous ground glass attenuation is identified within both lung bases along with mild interstitial thickening suggesting pulmonary edema. Small right pleural effusion. LIVER: The liver is unremarkable. GALLBLADDER AND BILE DUCTS: Status post cholecystectomy. No biliary ductal dilatation. SPLEEN: The spleen is within normal limits in size and appearance. PANCREAS: The pancreas is normal in size and contour without focal lesion or ductal dilatation. ADRENAL GLANDS: Normal size and morphology bilaterally. No nodule, thickening, or hemorrhage. No periadrenal stranding. KIDNEYS, URETERS AND BLADDER: No stones in the kidneys or ureters. No hydronephrosis. No perinephric or periureteral stranding. The urinary bladder is decompressed. GI AND BOWEL: Stomach demonstrates no acute abnormality. The appendix is visualized and normal in caliber, without wall thickening, periappendiceal inflammation, or fluid. There is no bowel obstruction. PERITONEUM AND RETROPERITONEUM: Moderate volume of ascites noted within the abdomen and pelvis, similar to the previous exam. No free air. VASCULATURE: Aorta is normal in caliber. Extensive vascular calcifications are again noted. LYMPH NODES: No lymphadenopathy. REPRODUCTIVE ORGANS: Uterus and adnexal structures are unremarkable. BONES AND  SOFT TISSUES: No acute osseous abnormality. Diffuse body wall edema compatible with anasarca. IMPRESSION: 1. Findings of pulmonary edema with small right pleural effusion. 2. Moderate ascites, similar to prior. 3. Diffuse body wall edema compatible with anasarca. Electronically signed by: Waddell Calk MD 03/08/2024 08:52 AM EST RP Workstation: [id number]   DG Chest Port 1 View Result Date: 03/08/2024 CLINICAL DATA:  Hypoglycemia, unresponsive, concern for sepsis EXAM: PORTABLE CHEST 1 VIEW COMPARISON:  02/22/2024 FINDINGS: Similar mild cardiomegaly without acute airspace process, pneumonia, collapse, consolidation, edema pattern, or CHF. No effusion or pneumothorax. Trachea midline. Monitor leads overlie the chest. Right upper arm axillary stent noted. IMPRESSION: Cardiomegaly without acute process. Electronically Signed   By: CHRISTELLA.  Shick M.D.   On: 03/08/2024 08:06    Micro Results     Recent Results (from the past 240 hours)  Resp panel by RT-PCR (RSV, Flu A&B, Covid) Anterior Nasal Swab     Status: None   Collection Time: 03/22/24  3:31 AM   Specimen: Anterior Nasal Swab  Result Value Ref Range Status   SARS Coronavirus 2 by RT PCR NEGATIVE NEGATIVE Final   Influenza A by PCR NEGATIVE NEGATIVE Final   Influenza B by PCR NEGATIVE NEGATIVE Final    Comment: (NOTE) The Xpert Xpress SARS-CoV-2/FLU/RSV plus assay is intended as an aid in the diagnosis of influenza from Nasopharyngeal swab specimens and should not be used as a sole basis for treatment. Nasal washings and aspirates are unacceptable for Xpert Xpress SARS-CoV-2/FLU/RSV testing.  Fact Sheet for Patients: bloggercourse.com  Fact Sheet for Healthcare Providers: seriousbroker.it  This test is not yet approved or cleared by the [place]  FDA and has been authorized for detection and/or diagnosis of SARS-CoV-2 by FDA under  an Emergency Use Authorization (EUA). This EUA will  remain in effect (meaning this test can be used) for the duration of the COVID-19 declaration under Section 564(b)(1) of the Act, 21 U.S.C. section 360bbb-3(b)(1), unless the authorization is terminated or revoked.     Resp Syncytial Virus by PCR NEGATIVE NEGATIVE Final    Comment: (NOTE) Fact Sheet for Patients: bloggercourse.com  Fact Sheet for Healthcare Providers: seriousbroker.it  This test is not yet approved or cleared by the [place]  FDA and has been authorized for detection and/or diagnosis of SARS-CoV-2 by FDA under an Emergency Use Authorization (EUA). This EUA will remain in effect (meaning this test can be used) for the duration of the COVID-19 declaration under Section 564(b)(1) of the Act, 21 U.S.C. section 360bbb-3(b)(1), unless the authorization is terminated or revoked.  Performed at [facility], [address] [place], [place] [address]   MRSA Next Gen by PCR, Nasal     Status: None   Collection Time: 03/23/24  1:26 AM   Specimen: Nasal Mucosa; Nasal Swab  Result Value Ref Range Status   MRSA by PCR Next Gen NOT DETECTED NOT DETECTED Final    Comment: (NOTE) The GeneXpert MRSA Assay (FDA approved for NASAL specimens only), is one component of a comprehensive MRSA colonization surveillance program. It is not intended to diagnose MRSA infection nor to guide or monitor treatment for MRSA infections. Test performance is not FDA approved in patients less than 10 years old. Performed at [facility], [address] [place], [place] [address]   Gram stain     Status: None   Collection Time: 03/23/24  1:37 PM   Specimen: Abdomen; Peritoneal Fluid  Result Value Ref Range Status   Specimen Description PERITONEAL  Final   Special Requests NONE  Final   Gram Stain   Final    WBC PRESENT, PREDOMINANTLY MONONUCLEAR CORRECTED RESULTS CORRECTED ON 11/25 AT 1214: PREVIOUSLY REPORTED AS GRAM POSITIVE  COCCI NO ORGANISMS SEEN PREVIOUSLY REPORTED AS: GRAM POSITIVE [facility]CCI CORRECTED RESULTS CALLED TO: PHARMD EMILY S [id number] FCP Performed at [facility], [address] [place], [place] [address]    Report Status 03/24/2024 FINAL  Final  Culture, body fluid w Gram Stain-bottle     Status: None (Preliminary result)   Collection Time: 03/23/24  2:14 PM   Specimen: Peritoneal Washings  Result Value Ref Range Status   Specimen Description PERITONEAL  Final   Special Requests NONE  Final   Gram Stain PENDING  Incomplete   Culture   Final    NO GROWTH 5 DAYS Performed at [facility], [address] [place], [place] [address]    Report Status PENDING  Incomplete  Blood culture (routine x 2)     Status: None (Preliminary result)   Collection Time: 03/29/24 12:25 AM   Specimen: BLOOD  Result Value Ref Range Status   Specimen Description BLOOD RIGHT ANTECUBITAL  Final   Special Requests   Final    BOTTLES DRAWN AEROBIC AND ANAEROBIC Blood Culture adequate volume   Culture   Final    NO GROWTH < 12 HOURS Performed at [facility] [facility], [address] [place], [place] [address]    Report Status PENDING  Incomplete  Blood culture (routine x 2)     Status: None (Preliminary result)   Collection Time: 03/29/24 12:30 AM   Specimen: BLOOD  Result Value Ref Range Status   Specimen Description BLOOD  LEFT ANTECUBITAL  Final   Special Requests   Final    BOTTLES DRAWN AEROBIC AND ANAEROBIC Blood Culture adequate volume   Culture   Final    NO GROWTH < 12 HOURS Performed at [facility], [address] [place], [place] [address]    Report Status PENDING  Incomplete    Today   Subjective    Ashley Freeman today has no headache,no chest abdominal pain,no new weakness tingling or numbness, feels much better wants to go home today.     Objective   Blood pressure 107/87, pulse 87, temperature 98.1 F (36.7 C), temperature source Oral, resp. rate (!)  21, height 5' 3 (1.6 m), weight 61.4 kg, SpO2 97%.   Intake/Output Summary (Last 24 hours) at 03/30/2024 0733 Last data filed at 03/29/2024 2122 Gross per 24 hour  Intake 3 ml  Output 3000 ml  Net -2997 ml    Exam  Awake Alert, No new F.N deficits,    [place].AT,PERRAL Supple Neck,   Symmetrical Chest wall movement, Good air movement bilaterally, CTAB RRR,No Gallops,   +ve B.Sounds, Abd Soft, Non tender,  No Cyanosis, Clubbing or edema    Data Review   Recent Labs  Lab 03/24/24 0547 03/28/24 2124 03/29/24 0623 03/29/24 1129 03/30/24 0334  WBC 12.1* 22.2* 16.0* 13.3* 10.7*  HGB 10.5* 9.8* 11.3* 9.8* 9.9*  HCT 35.0* 33.5* 37.8 32.9* 33.1*  PLT 239 181 188 163 165  MCV 90.7 91.8 89.8 89.9 89.7  MCH 27.2 26.8 26.8 26.8 26.8  MCHC 30.0 29.3* 29.9* 29.8* 29.9*  RDW 16.3* 15.9* 15.9* 15.9* 15.8*  LYMPHSABS  --  3.3  --   --   --   MONOABS  --  0.8  --   --   --   EOSABS  --  0.8*  --   --   --   BASOSABS  --  0.1  --   --   --     Recent Labs  Lab 03/24/24 0547 03/28/24 2124 03/29/24 0029 03/29/24 0623 03/29/24 1129 03/30/24 0334  NA 135 141  --  139 140 138  K 4.3 5.1  --  5.3* 5.4* 4.8  CL 98 106  --  107 108 99  CO2 19* 23  --  18* 20* 27  ANIONGAP 18* 12  --  14 12 12   GLUCOSE 338* 247*  --  96 89 386*  BUN 32* 36*  --  40* 40* 29*  CREATININE 6.33* 7.71*  --  7.54* 7.62* 5.84*  AST  --  120*  --  79*  --  50*  ALT  --  27  --  31  --  23  ALKPHOS  --  365*  --  463*  --  389*  BILITOT  --  1.0  --  0.9  --  1.1  ALBUMIN   --  2.6*  --  3.0* 2.4* 2.6*  PROCALCITON  --   --   --  36.95  --   --   LATICACIDVEN  --   --  1.1  --   --   --   PHOS  --   --   --   --  9.1*  --   CALCIUM  8.0* 8.1*  --  8.3* 8.1* 8.3*    Total Time in preparing paper work, data evaluation and todays exam - 35 minutes  Signature  -    Lavada Stank M.D on 03/30/2024 at 7:33  AM   -  To page go to [email or web address]

## 2024-04-01 LAB — CULTURE, BODY FLUID W GRAM STAIN -BOTTLE: Culture: NO GROWTH

## 2024-04-03 LAB — CULTURE, BLOOD (ROUTINE X 2)
Culture: NO GROWTH
Culture: NO GROWTH
Special Requests: ADEQUATE
Special Requests: ADEQUATE

## 2024-04-06 ENCOUNTER — Other Ambulatory Visit: Payer: Self-pay

## 2024-04-06 ENCOUNTER — Inpatient Hospital Stay (HOSPITAL_COMMUNITY)
Admission: EM | Admit: 2024-04-06 | Discharge: 2024-04-08 | DRG: 637 | Attending: Internal Medicine | Admitting: Internal Medicine

## 2024-04-06 ENCOUNTER — Emergency Department (HOSPITAL_COMMUNITY)

## 2024-04-06 ENCOUNTER — Observation Stay (HOSPITAL_COMMUNITY)

## 2024-04-06 ENCOUNTER — Encounter: Admitting: Podiatry

## 2024-04-06 ENCOUNTER — Encounter (HOSPITAL_COMMUNITY): Payer: Self-pay | Admitting: Hospitalist

## 2024-04-06 DIAGNOSIS — E8770 Fluid overload, unspecified: Secondary | ICD-10-CM

## 2024-04-06 DIAGNOSIS — R188 Other ascites: Secondary | ICD-10-CM

## 2024-04-06 DIAGNOSIS — E1010 Type 1 diabetes mellitus with ketoacidosis without coma: Principal | ICD-10-CM | POA: Diagnosis present

## 2024-04-06 DIAGNOSIS — R0600 Dyspnea, unspecified: Secondary | ICD-10-CM

## 2024-04-06 DIAGNOSIS — N186 End stage renal disease: Secondary | ICD-10-CM

## 2024-04-06 HISTORY — PX: IR PARACENTESIS: IMG2679

## 2024-04-06 LAB — CBG MONITORING, ED
Glucose-Capillary: >600 mg/dL (ref 70–99)
Glucose-Capillary: >600 mg/dL (ref 70–99)
Glucose-Capillary: >600 mg/dL (ref 70–99)
Glucose-Capillary: 553 mg/dL (ref 70–99)
Glucose-Capillary: 563 mg/dL (ref 70–99)

## 2024-04-06 LAB — BODY FLUID CELL COUNT WITH DIFFERENTIAL
Eos, Fluid: 0 %
Lymphs, Fluid: 33 %
Monocyte-Macrophage-Serous Fluid: 62 % (ref 50–90)
Neutrophil Count, Fluid: 5 % (ref 0–25)
Total Nucleated Cell Count, Fluid: 304 uL (ref 0–1000)

## 2024-04-06 LAB — COMPREHENSIVE METABOLIC PANEL WITH GFR
ALT: 28 U/L (ref 0–44)
AST: 28 U/L (ref 15–41)
Albumin: 3.1 g/dL — ABNORMAL LOW (ref 3.5–5.0)
Alkaline Phosphatase: 472 U/L — ABNORMAL HIGH (ref 38–126)
Anion gap: 29 — ABNORMAL HIGH (ref 5–15)
BUN: 42 mg/dL — ABNORMAL HIGH (ref 6–20)
CO2: 14 mmol/L — ABNORMAL LOW (ref 22–32)
Calcium: 9.2 mg/dL (ref 8.9–10.3)
Chloride: 91 mmol/L — ABNORMAL LOW (ref 98–111)
Creatinine, Ser: 7.18 mg/dL — ABNORMAL HIGH (ref 0.44–1.00)
GFR, Estimated: 7 mL/min — ABNORMAL LOW (ref >60)
Glucose, Bld: 791 mg/dL (ref 70–99)
Potassium: 6.1 mmol/L — ABNORMAL HIGH (ref 3.5–5.1)
Sodium: 134 mmol/L — ABNORMAL LOW (ref 135–145)
Total Bilirubin: 2.4 mg/dL — ABNORMAL HIGH (ref 0.0–1.2)
Total Protein: 5.5 g/dL — ABNORMAL LOW (ref 6.5–8.1)

## 2024-04-06 LAB — CBC WITH DIFFERENTIAL/PLATELET
Abs Immature Granulocytes: 0.06 K/uL (ref 0.00–0.07)
Basophils Absolute: 0.1 K/uL (ref 0.0–0.1)
Basophils Relative: 1 %
Eosinophils Absolute: 0.1 K/uL (ref 0.0–0.5)
Eosinophils Relative: 1 %
HCT: 36 % (ref 36.0–46.0)
Hemoglobin: 11 g/dL — ABNORMAL LOW (ref 12.0–15.0)
Immature Granulocytes: 1 %
Lymphocytes Relative: 20 %
Lymphs Abs: 2.5 K/uL (ref 0.7–4.0)
MCH: 26.6 pg (ref 26.0–34.0)
MCHC: 30.6 g/dL (ref 30.0–36.0)
MCV: 87.2 fL (ref 80.0–100.0)
Monocytes Absolute: 0.7 K/uL (ref 0.1–1.0)
Monocytes Relative: 5 %
Neutro Abs: 9.1 K/uL — ABNORMAL HIGH (ref 1.7–7.7)
Neutrophils Relative %: 72 %
Platelets: 293 K/uL (ref 150–400)
RBC: 4.13 MIL/uL (ref 3.87–5.11)
RDW: 14.9 % (ref 11.5–15.5)
WBC: 12.5 K/uL — ABNORMAL HIGH (ref 4.0–10.5)
nRBC: 0 % (ref 0.0–0.2)

## 2024-04-06 LAB — I-STAT CG4 LACTIC ACID, ED
Lactic Acid, Venous: 3.7 mmol/L (ref 0.5–1.9)
Lactic Acid, Venous: 4.2 mmol/L (ref 0.5–1.9)

## 2024-04-06 LAB — I-STAT VENOUS BLOOD GAS, ED
Acid-base deficit: 9 mmol/L — ABNORMAL HIGH (ref 0.0–2.0)
Bicarbonate: 15.1 mmol/L — ABNORMAL LOW (ref 20.0–28.0)
Calcium, Ion: 1 mmol/L — ABNORMAL LOW (ref 1.15–1.40)
HCT: 38 % (ref 36.0–46.0)
Hemoglobin: 12.9 g/dL (ref 12.0–15.0)
O2 Saturation: 98 %
Potassium: 6.2 mmol/L — ABNORMAL HIGH (ref 3.5–5.1)
Sodium: 128 mmol/L — ABNORMAL LOW (ref 135–145)
TCO2: 16 mmol/L — ABNORMAL LOW (ref 22–32)
pCO2, Ven: 28.2 mmHg — ABNORMAL LOW (ref 44–60)
pH, Ven: 7.337 (ref 7.25–7.43)
pO2, Ven: 119 mmHg — ABNORMAL HIGH (ref 32–45)

## 2024-04-06 LAB — CREATININE, SERUM
Creatinine, Ser: 7.42 mg/dL — ABNORMAL HIGH (ref 0.44–1.00)
GFR, Estimated: 7 mL/min — ABNORMAL LOW (ref >60)

## 2024-04-06 LAB — GLUCOSE, CAPILLARY
Glucose-Capillary: 237 mg/dL — ABNORMAL HIGH (ref 70–99)
Glucose-Capillary: 35 mg/dL — CL (ref 70–99)
Glucose-Capillary: 379 mg/dL — ABNORMAL HIGH (ref 70–99)

## 2024-04-06 LAB — I-STAT CHEM 8, ED
BUN: 50 mg/dL — ABNORMAL HIGH (ref 6–20)
Calcium, Ion: 1.01 mmol/L — ABNORMAL LOW (ref 1.15–1.40)
Chloride: 98 mmol/L (ref 98–111)
Creatinine, Ser: 6.8 mg/dL — ABNORMAL HIGH (ref 0.44–1.00)
Glucose, Bld: >700 mg/dL (ref 70–99)
HCT: 38 % (ref 36.0–46.0)
Hemoglobin: 12.9 g/dL (ref 12.0–15.0)
Potassium: 6.3 mmol/L (ref 3.5–5.1)
Sodium: 130 mmol/L — ABNORMAL LOW (ref 135–145)
TCO2: 16 mmol/L — ABNORMAL LOW (ref 22–32)

## 2024-04-06 LAB — PROTIME-INR
INR: 1.5 — ABNORMAL HIGH (ref 0.8–1.2)
Prothrombin Time: 18.8 s — ABNORMAL HIGH (ref 11.4–15.2)

## 2024-04-06 LAB — TROPONIN I (HIGH SENSITIVITY)
Troponin I (High Sensitivity): 16 ng/L (ref <18)
Troponin I (High Sensitivity): 15 ng/L (ref <18)

## 2024-04-06 LAB — BETA-HYDROXYBUTYRIC ACID
Beta-Hydroxybutyric Acid: 6.99 mmol/L — ABNORMAL HIGH (ref 0.05–0.27)
Beta-Hydroxybutyric Acid: 2.03 mmol/L — ABNORMAL HIGH (ref 0.05–0.27)
Beta-Hydroxybutyric Acid: 0.89 mmol/L — ABNORMAL HIGH (ref 0.05–0.27)

## 2024-04-06 LAB — HCG, SERUM, QUALITATIVE: Preg, Serum: NEGATIVE

## 2024-04-06 LAB — PROTEIN, PLEURAL OR PERITONEAL FLUID: Total protein, fluid: <3 g/dL

## 2024-04-06 LAB — LACTATE DEHYDROGENASE, PLEURAL OR PERITONEAL FLUID: LD, Fluid: 78 U/L — ABNORMAL HIGH (ref 3–23)

## 2024-04-06 LAB — ALBUMIN, PLEURAL OR PERITONEAL FLUID: Albumin, Fluid: 1.5 g/dL

## 2024-04-06 LAB — GLUCOSE, PLEURAL OR PERITONEAL FLUID: Glucose, Fluid: 753 mg/dL

## 2024-04-06 MED ORDER — LIDOCAINE-EPINEPHRINE 1 %-1:100000 IJ SOLN
INTRAMUSCULAR | Status: AC
  Filled 2024-04-06: qty 1

## 2024-04-06 MED ORDER — DEXTROSE 50 % IV SOLN
0.0000 mL | INTRAVENOUS | Status: DC | PRN
  Administered 2024-04-07 – 2024-04-08 (×3): 50 mL via INTRAVENOUS
  Filled 2024-04-06 (×2): qty 50

## 2024-04-06 MED ORDER — INSULIN ASPART 100 UNIT/ML IJ SOLN
0.0000 [IU] | Freq: Three times a day (TID) | INTRAMUSCULAR | Status: DC
  Administered 2024-04-06: 15 [IU] via SUBCUTANEOUS
  Filled 2024-04-06: qty 15

## 2024-04-06 MED ORDER — DEXTROSE IN LACTATED RINGERS 5 % IV SOLN: INTRAVENOUS | Status: DC

## 2024-04-06 MED ORDER — HEPARIN SODIUM (PORCINE) 5000 UNIT/ML IJ SOLN
5000.0000 [IU] | Freq: Three times a day (TID) | INTRAMUSCULAR | Status: DC
  Administered 2024-04-07 – 2024-04-08 (×2): 5000 [IU] via SUBCUTANEOUS
  Filled 2024-04-06 (×2): qty 1

## 2024-04-06 MED ORDER — ONDANSETRON HCL 4 MG/2ML IJ SOLN
4.0000 mg | Freq: Once | INTRAMUSCULAR | Status: AC
  Administered 2024-04-06: 4 mg via INTRAVENOUS
  Filled 2024-04-06: qty 2

## 2024-04-06 MED ORDER — PATIROMER SORBITEX CALCIUM 16.8 G PO PACK: 16.8000 | PACK | Freq: Every day | ORAL | Status: DC

## 2024-04-06 MED ORDER — SEVELAMER CARBONATE 800 MG PO TABS
800.0000 mg | ORAL_TABLET | Freq: Three times a day (TID) | ORAL | Status: DC
  Administered 2024-04-07 – 2024-04-08 (×5): 800 mg via ORAL
  Filled 2024-04-06 (×5): qty 1

## 2024-04-06 MED ORDER — OXYCODONE-ACETAMINOPHEN 5-325 MG PO TABS
1.0000 | ORAL_TABLET | Freq: Once | ORAL | Status: AC
  Administered 2024-04-06: 1 via ORAL

## 2024-04-06 MED ORDER — HEPARIN SODIUM (PORCINE) 1000 UNIT/ML DIALYSIS: 1500.0000 [IU] | INTRAMUSCULAR | Status: DC | PRN

## 2024-04-06 MED ORDER — HYDROXYZINE HCL 25 MG PO TABS
25.0000 mg | ORAL_TABLET | Freq: Once | ORAL | Status: AC
  Administered 2024-04-06: 25 mg via ORAL
  Filled 2024-04-06: qty 1

## 2024-04-06 MED ORDER — CARVEDILOL 25 MG PO TABS
25.0000 mg | ORAL_TABLET | Freq: Two times a day (BID) | ORAL | Status: DC
  Administered 2024-04-06 – 2024-04-07 (×3): 25 mg via ORAL
  Filled 2024-04-06 (×3): qty 1

## 2024-04-06 MED ORDER — PANTOPRAZOLE SODIUM 40 MG PO TBEC
40.0000 mg | DELAYED_RELEASE_TABLET | Freq: Two times a day (BID) | ORAL | Status: DC
  Administered 2024-04-06 – 2024-04-08 (×4): 40 mg via ORAL
  Filled 2024-04-06 (×4): qty 1

## 2024-04-06 MED ORDER — INSULIN GLARGINE 100 UNIT/ML ~~LOC~~ SOLN
10.0000 [IU] | Freq: Two times a day (BID) | SUBCUTANEOUS | Status: DC
  Administered 2024-04-06: 10 [IU] via SUBCUTANEOUS
  Filled 2024-04-06 (×3): qty 0.1

## 2024-04-06 MED ORDER — HEPARIN SODIUM (PORCINE) 1000 UNIT/ML DIALYSIS
2500.0000 [IU] | Freq: Once | INTRAMUSCULAR | Status: AC
  Administered 2024-04-06: 2500 [IU] via INTRAVENOUS_CENTRAL
  Filled 2024-04-06: qty 3

## 2024-04-06 MED ORDER — OXYCODONE-ACETAMINOPHEN 5-325 MG PO TABS
1.0000 | ORAL_TABLET | Freq: Once | ORAL | Status: DC
  Filled 2024-04-06: qty 1

## 2024-04-06 MED ORDER — LIDOCAINE-EPINEPHRINE 1 %-1:100000 IJ SOLN
20.0000 mL | Freq: Once | INTRAMUSCULAR | Status: AC
  Administered 2024-04-06: 10 mL via INTRADERMAL

## 2024-04-06 MED ORDER — FENTANYL CITRATE (PF) 50 MCG/ML IJ SOSY
50.0000 ug | PREFILLED_SYRINGE | INTRAMUSCULAR | Status: DC | PRN
  Administered 2024-04-06 – 2024-04-08 (×8): 50 ug via INTRAVENOUS
  Filled 2024-04-06 (×8): qty 1

## 2024-04-06 MED ORDER — ACETAMINOPHEN 325 MG PO TABS
650.0000 mg | ORAL_TABLET | Freq: Four times a day (QID) | ORAL | Status: DC | PRN
  Administered 2024-04-08 (×2): 650 mg via ORAL
  Filled 2024-04-06 (×2): qty 2

## 2024-04-06 MED ORDER — SODIUM BICARBONATE 650 MG PO TABS
650.0000 mg | ORAL_TABLET | Freq: Two times a day (BID) | ORAL | Status: DC
  Administered 2024-04-06 – 2024-04-08 (×4): 650 mg via ORAL
  Filled 2024-04-06 (×4): qty 1

## 2024-04-06 MED ORDER — OXYCODONE-ACETAMINOPHEN 5-325 MG PO TABS
ORAL_TABLET | ORAL | Status: AC
  Filled 2024-04-06: qty 1

## 2024-04-06 MED ORDER — SODIUM CHLORIDE 0.9 % IV SOLN
2.0000 g | INTRAVENOUS | Status: DC
  Administered 2024-04-07 – 2024-04-08 (×2): 2 g via INTRAVENOUS
  Filled 2024-04-06 (×2): qty 20

## 2024-04-06 MED ORDER — NEPRO/CARBSTEADY PO LIQD: 237.0000 mL | ORAL | Status: DC | PRN

## 2024-04-06 MED ORDER — FENTANYL CITRATE (PF) 50 MCG/ML IJ SOSY
50.0000 ug | PREFILLED_SYRINGE | Freq: Once | INTRAMUSCULAR | Status: AC
  Administered 2024-04-06: 50 ug via INTRAVENOUS
  Filled 2024-04-06: qty 1

## 2024-04-06 MED ORDER — ROSUVASTATIN CALCIUM 20 MG PO TABS
40.0000 mg | ORAL_TABLET | Freq: Every day | ORAL | Status: DC
  Administered 2024-04-07 – 2024-04-08 (×2): 40 mg via ORAL
  Filled 2024-04-06 (×2): qty 2

## 2024-04-06 MED ORDER — SODIUM ZIRCONIUM CYCLOSILICATE 10 G PO PACK
10.0000 g | PACK | Freq: Once | ORAL | Status: DC
  Filled 2024-04-06: qty 1

## 2024-04-06 MED ORDER — HEPARIN SODIUM (PORCINE) 1000 UNIT/ML DIALYSIS: 1000.0000 [IU] | INTRAMUSCULAR | Status: DC | PRN

## 2024-04-06 MED ORDER — SODIUM BICARBONATE 8.4 % IV SOLN
50.0000 meq | Freq: Once | INTRAVENOUS | Status: AC
  Administered 2024-04-06: 50 meq via INTRAVENOUS
  Filled 2024-04-06: qty 50

## 2024-04-06 MED ORDER — LACTATED RINGERS IV SOLN: INTRAVENOUS | Status: DC

## 2024-04-06 MED ORDER — INSULIN ASPART 100 UNIT/ML IJ SOLN
0.0000 [IU] | Freq: Every day | INTRAMUSCULAR | Status: DC
  Administered 2024-04-06: 2 [IU] via SUBCUTANEOUS
  Filled 2024-04-06: qty 2

## 2024-04-06 MED ORDER — ALTEPLASE 2 MG IJ SOLR: 2.0000 mg | Freq: Once | INTRAMUSCULAR | Status: DC | PRN

## 2024-04-06 MED ORDER — SODIUM CHLORIDE 0.9% FLUSH
3.0000 mL | Freq: Two times a day (BID) | INTRAVENOUS | Status: DC
  Administered 2024-04-06 – 2024-04-08 (×4): 3 mL via INTRAVENOUS

## 2024-04-06 MED ORDER — CHLORHEXIDINE GLUCONATE CLOTH 2 % EX PADS
6.0000 | MEDICATED_PAD | Freq: Every day | CUTANEOUS | Status: DC
  Administered 2024-04-08: 6 via TOPICAL

## 2024-04-06 MED ORDER — ACETAMINOPHEN 650 MG RE SUPP: 650.0000 mg | Freq: Four times a day (QID) | RECTAL | Status: DC | PRN

## 2024-04-06 MED ORDER — PATIROMER SORBITEX CALCIUM 8.4 G PO PACK
16.8000 g | PACK | Freq: Every day | ORAL | Status: DC
  Administered 2024-04-07 – 2024-04-08 (×2): 16.8 g via ORAL
  Filled 2024-04-06 (×3): qty 2

## 2024-04-06 MED ORDER — LACTATED RINGERS IV BOLUS
20.0000 mL/kg | Freq: Once | INTRAVENOUS | Status: AC
  Administered 2024-04-06: 1228 mL via INTRAVENOUS

## 2024-04-06 MED ORDER — HEPARIN SODIUM (PORCINE) 1000 UNIT/ML IJ SOLN
INTRAMUSCULAR | Status: AC
  Filled 2024-04-06: qty 3

## 2024-04-06 MED ORDER — INSULIN REGULAR(HUMAN) IN NACL 100-0.9 UT/100ML-% IV SOLN
INTRAVENOUS | Status: DC
  Administered 2024-04-06: 5.5 [IU]/h via INTRAVENOUS
  Filled 2024-04-06: qty 100

## 2024-04-06 MED ORDER — INSULIN ASPART 100 UNIT/ML IV SOLN
10.0000 [IU] | Freq: Once | INTRAVENOUS | Status: AC
  Administered 2024-04-06: 10 [IU] via INTRAVENOUS
  Filled 2024-04-06: qty 10

## 2024-04-06 NOTE — H&P (Signed)
 History and Physical    Patient: Ashley Freeman FMW:[id number] DOB: 06-Mar-1991 DOA: 04/06/2024 DOS: the patient was seen and examined on 04/06/2024 PCP: Keven Crumbly Pap, MD  Patient coming from: Home  Chief Complaint:  Chief Complaint  Patient presents with   Hyperglycemia    Pt has bg over 600 missed dialysis Saturday and is having abdomin and chest pain    HPI: Ashley Freeman is a 33 y.o. 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  Patient was recently discharged on 12/1 after an admission for positive SIRS physiology without infectious process and hypoglycemia.  At that presentation her CBG was 35 in the field and it was documented she had also not been checking her CBGs for multiple months and had been taking her insulin  once every 3 to 4 days and not eating adequately.  She underwent paracentesis for her ascites and 6 L was removed.  Her SAAG was consistent with hepatic etiology to her ascites and GI evaluation in the outpatient setting is pending.  She presented via EMS back to the ED with reports of abdominal distention, abdominal and chest pain after missing dialysis on Saturday.  She reported that she did not go to dialysis because she felt poorly.  In the field her glucose was greater than 600.  When questioned she stated she had been taking her insulin  and other medications as prescribed.  Her anion gap was elevated at 29 and her potassium was 6.3 and a sodium of 130.  She has been started on an insulin  infusion by the EDP.  EDP attempting to contact IR for paracentesis although he may proceed with paracentesis given patient's severity of abdominal pain.  She denied fevers or chills in the outpatient setting and has remained afebrile here but does have  mild elevation in WBCs with normal shift.  Given severity of discomfort although this may be mechanical in nature once paracentesis completed plan is to initiate IV antibiotics.  Patient is allergic to cephalosporins and penicillins therefore we will initially order Cipro  and less pharmacy recommends another agent.  Hospitalist service has been asked to evaluate the patient for admission.   Review of Systems: As mentioned in the history of present illness. All other systems reviewed and are negative. Past Medical History:  Diagnosis Date   Abscess, gluteal, right 08/24/2013   Anemia 02/19/2012   Bartholin's gland abscess 09/19/2013   Bipolar disorder (HCC)    BV (bacterial vaginosis) 11/24/2015   Depression    Diabetes mellitus type I (HCC) 2001   Diagnosed at age 39 ; Type I   Diarrhea 05/30/2016   DKA (diabetic ketoacidoses) 08/19/2013   Also in 2018   ESRD (end stage renal disease) (HCC)    Gonorrhea 08/2011   Treated in 09/2011   HFrEF (heart failure with reduced ejection fraction) (HCC)    a. 2022 Echo: EF 40%; b. 10/2021 Echo: EF 55%; b. 07/2022 MV: No ischemia. EF 31%; c. 08/2022 Echo: EF 35%, mildly dil RV, sev TR.   History of trichomoniasis 05/31/2016   Hyperlipidemia 03/28/2016   Hypertension    NICM (nonischemic cardiomyopathy) (HCC)    Sepsis (HCC) 09/19/2013   Past Surgical History:  Procedure Laterality Date   A/V FISTULAGRAM Right 06/17/2023   Procedure: A/V Fistulagram;  Surgeon: Marea Selinda GORMAN, MD;  Location: [facility] INVASIVE  CV LAB;  Service: Cardiovascular;  Laterality: Right;   A/V SHUNT INTERVENTION N/A 09/25/2023   Procedure: A/V SHUNT INTERVENTION;  Surgeon: Pearline Norman RAMAN, MD;  Location: [facility] PV LAB;  Service: Cardiovascular;  Laterality: N/A;   AV FISTULA PLACEMENT Right 07/06/2022   Procedure: ARTERIOVENOUS GRAFT CREATION;  Surgeon: Gretta Lonni PARAS, MD;  Location: [facility] OR;  Service: Vascular;  Laterality: Right;   CESAREAN SECTION N/A 10/05/2019   Procedure:  CESAREAN SECTION;  Surgeon: Izell Harari, MD;  Location: [facility] LD ORS;  Service: Obstetrics;  Laterality: N/A;   CHOLECYSTECTOMY N/A 07/02/2023   Procedure: LAPAROSCOPIC CHOLECYSTECTOMY;  Surgeon: Ebbie Cough, MD;  Location: [facility] OR;  Service: General;  Laterality: N/A;   ESOPHAGOGASTRODUODENOSCOPY N/A 01/31/2024   Procedure: EGD (ESOPHAGOGASTRODUODENOSCOPY);  Surgeon: San Sandor GAILS, DO;  Location: [facility] ENDOSCOPY;  Service: Gastroenterology;  Laterality: N/A;   INCISION AND DRAINAGE ABSCESS Left 09/28/2019   Procedure: INCISION AND DRAINAGE VULVAR ABCESS;  Surgeon: Edsel Norleen GAILS, MD;  Location: [facility] OR;  Service: Gynecology;  Laterality: Left;   INCISION AND DRAINAGE ABSCESS Right 02/23/2024   Procedure: INCISION AND DRAINAGE, ABSCESS;  Surgeon: Tobie Eldora NOVAK, MD;  Location: [facility] OR;  Service: ENT;  Laterality: Right;   INCISION AND DRAINAGE PERIRECTAL ABSCESS Right 08/18/2013   Procedure: IRRIGATION AND DEBRIDEMENT GLUTEAL ABSCESS;  Surgeon: Lynda Leos, MD;  Location: [facility] OR;  Service: General;  Laterality: Right;   INCISION AND DRAINAGE PERIRECTAL ABSCESS Right 09/19/2013   Procedure: IRRIGATION AND DEBRIDEMENT RIGHT GLUTEAL AND LABIAL ABSCESSES;  Surgeon: Lynda Leos, MD;  Location: [facility] OR;  Service: General;  Laterality: Right;   INCISION AND DRAINAGE PERIRECTAL ABSCESS Right 09/24/2013   Procedure: IRRIGATION AND DEBRIDEMENT PERIRECTAL ABSCESS;  Surgeon: Lynwood MALVA Pina, MD;  Location: [facility] OR;  Service: General;  Laterality: Right;   IR PARACENTESIS  08/28/2023   IR PARACENTESIS  11/04/2023   IR PARACENTESIS  12/23/2023   IR PARACENTESIS  02/04/2024   IR PARACENTESIS  03/23/2024   Social History:  reports that she has never smoked. She has never been exposed to tobacco smoke. She has never used smokeless tobacco. She reports that she does not currently use alcohol . She reports that she does not use drugs.  Allergies  Allergen Reactions   Keflex  [Cephalexin ] Anaphylaxis     Ceftriaxone  in the past with no reaction   Penicillins Anaphylaxis, Hives and Rash   Vibramycin  [Doxycycline ] Anaphylaxis   Benadryl  [Diphenhydramine ] Itching   Dilaudid  [Hydromorphone ] Itching   Methotrexate And Trimetrexate Rash   Roxicodone  [Oxycodone ] Itching    Takes Percocet without issue    Family History  Problem Relation Age of Onset   Asthma Mother    Carpal tunnel syndrome Mother    Gout Father    Diabetes Paternal Grandmother    Anesthesia problems Neg Hx     Prior to Admission medications   Medication Sig Start Date End Date Taking? Authorizing Provider  VELTASSA  16.8 g PACK Take by mouth. 04/03/24  Yes [provider]  albuterol  (VENTOLIN  HFA) 108 (90 Base) MCG/ACT inhaler Inhale 2 puffs into the lungs every 4 (four) hours as needed for wheezing or shortness of breath. 11/24/22   [provider]  bumetanide  (BUMEX ) 2 MG tablet Take 10 mg by mouth daily.    [provider]  carvedilol  (COREG ) 25 MG tablet Take 25 mg by mouth 2 (two) times daily with a meal.    [provider]  Cinnamon 500 MG capsule Take 500 mg by mouth  in the morning.    [provider]  DULoxetine  (CYMBALTA ) 20 MG capsule Take 20 mg by mouth daily. 07/29/23   [provider]  Elderberry-Vitamin C-Zinc  (ELDERBERRY IMMUNE HEALTH GUMMY PO) Take 2 each by mouth in the morning.    [provider]  fluticasone  (FLONASE ) 50 MCG/ACT nasal spray Place 2 sprays into both nostrils daily as needed for allergies or rhinitis. 12/11/23 12/10/24  [provider]  hydrOXYzine  (ATARAX ) 25 MG tablet Take 25 mg by mouth 3 (three) times daily as needed for anxiety, itching, nausea or vomiting.    [provider]  lamoTRIgine  (LAMICTAL ) 200 MG tablet Take 1 tablet (200 mg total) by mouth daily. 10/29/23   Regalado, Belkys A, MD  LANTUS  SOLOSTAR 100 UNIT/ML Solostar Pen Inject 5 Units into the skin daily. 03/30/24   Singh, Prashant K, MD  NOVOLOG   FLEXPEN 100 UNIT/ML FlexPen Before each meal 3 times a day, 140-199 - 2 units, 200-250 - 4 units, 251-299 - 6 units,  300-349 - 7 units,  350 or above 8 units. 03/30/24   Singh, Prashant K, MD  pantoprazole  (PROTONIX ) 40 MG tablet Take 1 tablet (40 mg total) by mouth 2 (two) times daily. 02/07/24 04/10/24  Drusilla Sabas RAMAN, MD  prochlorperazine  (COMPAZINE ) 5 MG tablet Take 5 mg by mouth every 6 (six) hours as needed for nausea or vomiting.    [provider]  rosuvastatin  (CRESTOR ) 40 MG tablet Take 40 mg by mouth daily. 01/20/24 01/19/25  [provider]  sacubitril -valsartan  (ENTRESTO ) 24-26 MG Take 1 tablet by mouth 2 (two) times daily.    [provider]  sevelamer  carbonate (RENVELA ) 800 MG tablet Take 1 tablet (800 mg total) by mouth 3 (three) times daily with meals. 02/07/24   Drusilla Sabas RAMAN, MD  sodium bicarbonate  650 MG tablet Take 650 mg by mouth 2 (two) times daily. 07/29/23   [provider]  SUMAtriptan  (IMITREX ) 50 MG tablet Take 50 mg by mouth every 2 (two) hours as needed for migraine or headache. 06/20/22   [provider]    Physical Exam: Vitals:   04/06/24 0930 04/06/24 0945 04/06/24 1000 04/06/24 1014  BP: (!) 192/96  (!) 178/99   Pulse: 97  98   Resp:  (!) 21 (!) 28   Temp:      TempSrc:      SpO2: 100%  97%   Weight:    59.9 kg  Height:    5' 3 (1.6 m)   Constitutional: NAD, uncomfortable secondary to significant abdominal distention Respiratory: clear to auscultation bilaterally, no wheezing, no crackles. Normal respiratory effort. No accessory muscle use.  Cardiovascular: Regular rate and rhythm, no murmurs / rubs / gallops. No extremity edema. 2+ pedal pulses. No carotid bruits.  Abdomen: Abdomen markedly distended and tympanitic and diffusely tender, no masses palpated.  Given degree of abdominal distention unable to appreciate if any hepatosplenomegaly. Bowel sounds positive but hypoactive.  Musculoskeletal: no clubbing /  cyanosis. No joint deformity upper and lower extremities. Good ROM, no contractures. Normal muscle tone.  Skin: no rashes, lesions, ulcers. No induration Neurologic: Awake and oriented x 3.  Due to ongoing pain having difficulty answering questions  Data Reviewed:  Initial sodium 131 with potassium of 6.1, chloride 91, CO2 14, glucose 791, BUN 42, creatinine 7.18, anion gap 29, alkaline phosphatase 472, albumin  3.1, AST and ALT are normal, total protein 5.5, total bilirubin 2.4  Repeat i-STAT at 826 sodium 130, potassium 6.3, glucose  greater than 700, BUN 50 and creatinine 6.8  Lactic acid 3.7 and 4.2  WBC 12,500 with neutrophils 72 and absolute neutrophils 9.1, hemoglobin 11, platelets 293,000  PT 18.8 and INR 1.5  Serum pregnancy negative  Single blood culture obtained in the ED  EKG sinus rhythm with QTc 510 ms  Assessment and Plan: Insulin -dependent diabetes mellitus DKA Presents with sugars in the 600-700 range with anion gap 29 Started on an insulin  infusion Discussed with nephrologist and since she is a dialysis patient no indication to initiate fluid challenges at this juncture Will continue IV insulin  until can present to dialysis after lunch-will stop insulin  just prior to dialysis and anticipate dialysis will correct glucose and electrolyte imbalances Despite patient stating she was taking her medications i.e. insulin  at home as prescribed she typically does not and given presentation I doubt she was taking her insulin  regularly On insulin  infusion will check CBGs every 1 hour and after dialysis can switch to AC/at bedtime schedule Hemoglobin A1c previous admission was 8.5 Initial beta-hydroxybutyrate acid 6.99 Appreciate assistance of diabetes coordinator Based on med rec at time of discharge was on Lantus  5 units daily noting dose had been decreased given presentation with hypoglycemia.  Patient was also on sliding scale meal coverage with NovoLog   Chronic kidney  disease on dialysis Acute hyperkalemia Dialysis schedule is TTS and patient missed on Saturday per her report Lungs are clear and not overtly volume overloaded except for ascites Plans are to proceed with dialysis after lunch today when a scheduled slot will be available Regarding hyperkalemia she has received IV insulin  per current infusion as well as 1 amp of sodium bicarbonate -recommend repeat renal function panel prior to coming off of dialysis today-can resume preoperative Veltassa  postdialysis Lokelma  ordered by EDP but does not appear to have been taken as of yet Management of bone mineral disease per nephrology team as well (prior to admission was on Renvela  and sodium bicarbonate  tablet) Follow intake and output as well as weight Once anion gap closed we will initiate renal diet with fluid restriction  Marked ascites with mild transaminitis Etiology of liver disease unclear Prior CT imaging shows normal-appearing liver Prior hepatitis panels have been negative Prior body fluid cytology with mesothelial cells Given degree of abdominal pain uncertain if this is more mechanical in nature given degree of distention noting prior paracentesis removed 6 L or if this is evidence of evolving bacterial peritonitis-EDP now confirms that IR available to perform paracentesis Have asked pharmacist to recommend antibiotic given patient is allergic to cephalosporins and penicillins and has a prolonged QTc which would prohibit use of flora quinolone.  Pharmacist performed chart review and states patient has tolerated Rocephin  in the past including previous admission so post paracentesis we will initiate Rocephin  Documentation on recent discharge summary states SAAG consistent with hepatic etiology to ascites  HFrEF/grade 2 diastolic dysfunction Last echocardiogram completed Aug 29, 2023 EF 30 to 35% with moderate to marked decrease LV function Except for ascites appears euvolemic and no peripheral  edema Volume management achieved with dialysis Prior to admission patient had been prescribed carvedilol  Entresto -unclear if remains on Bumex  Unclear if patient has taken other ACE inhibitors so we will hold Entresto  until confirm  HLD Resume Crestor   Bipolar disorder Appears to only be on Cymbalta  and Atarax    Advance Care Planning:   Code Status: Full Code   VTE prophylaxis: Subcutaneous heparin   Consults: Nephrology, interventional radiology  Family Communication: Patient on  Severity of Illness: The  appropriate patient status for this patient is INPATIENT. Inpatient status is judged to be reasonable and necessary in order to provide the required intensity of service to ensure the patient's safety. The patient's presenting symptoms, physical exam findings, and initial radiographic and laboratory data in the context of their chronic comorbidities is felt to place them at high risk for further clinical deterioration. Furthermore, it is not anticipated that the patient will be medically stable for discharge from the hospital within 2 midnights of admission.   * I certify that at the point of admission it is my clinical judgment that the patient will require inpatient hospital care spanning beyond 2 midnights from the point of admission due to high intensity of service, high risk for further deterioration and high frequency of surveillance required.*  Author: Isaiah Lever, NP 04/06/2024 11:19 AM  For on call review [email or web address].

## 2024-04-06 NOTE — Progress Notes (Signed)
 Patient received in the unit. A/O X 4. CCMD notified. Skin assessment done with RN Channing. Patient complaining of pain. Meds given as per MAR. Call bell within reach.

## 2024-04-06 NOTE — ED Notes (Signed)
 Cm 8 and lactic results given to sara a.rn by at

## 2024-04-06 NOTE — Progress Notes (Signed)
Patient did not show for scheduled appointment today.

## 2024-04-06 NOTE — ED Notes (Signed)
 Insulin  stopped per dialysis RN as transport is taking patient to dialysis at this time.

## 2024-04-06 NOTE — ED Provider Notes (Signed)
 Ashley Freeman Provider Note   CSN: [id number] Arrival date & time: 04/06/24  9366     Patient presents with: Hyperglycemia (Pt has bg over 600 missed dialysis Saturday and is having abdomin and chest pain )   Ashley Freeman is a 33 y.o. female.   HPI    33 y.o. female with medical history significant for ESRD on hemodialysis, chronic HFrEF, type 1 diabetes mellitus, hypertension, and bipolar disorder.  Patient comes in with chief complaint of missed dialysis, shortness of breath, chest discomfort and abdominal discomfort.  Patient reports that she had her dialysis on Friday, but was advised to come back on Saturday which she did not.  Yesterday she started experiencing chest discomfort, abdominal pain that is generalized.  Chest pain is in the upper part of her chest and it is nonradiating and described as sharp pain.  Patient has some shortness of breath when laying flat.  Patient denies any vomiting, fevers, chills.  However, her blood sugar is elevated.    Prior to Admission medications   Medication Sig Start Date End Date Taking? Authorizing Provider  VELTASSA  16.8 g PACK Take by mouth. 04/03/24  Yes [provider]  albuterol  (VENTOLIN  HFA) 108 (90 Base) MCG/ACT inhaler Inhale 2 puffs into the lungs every 4 (four) hours as needed for wheezing or shortness of breath. 11/24/22   [provider]  bumetanide  (BUMEX ) 2 MG tablet Take 10 mg by mouth daily.    [provider]  carvedilol  (COREG ) 25 MG tablet Take 25 mg by mouth 2 (two) times daily with a meal.    [provider]  Cinnamon 500 MG capsule Take 500 mg by mouth in the morning.    [provider]  DULoxetine  (CYMBALTA ) 20 MG capsule Take 20 mg by mouth daily. 07/29/23   [provider]  Elderberry-Vitamin C-Zinc  (ELDERBERRY IMMUNE HEALTH GUMMY PO) Take 2 each by mouth in the morning.    [provider]   fluticasone  (FLONASE ) 50 MCG/ACT nasal spray Place 2 sprays into both nostrils daily as needed for allergies or rhinitis. 12/11/23 12/10/24  [provider]  hydrOXYzine  (ATARAX ) 25 MG tablet Take 25 mg by mouth 3 (three) times daily as needed for anxiety, itching, nausea or vomiting.    [provider]  lamoTRIgine  (LAMICTAL ) 200 MG tablet Take 1 tablet (200 mg total) by mouth daily. 10/29/23   Regalado, Belkys A, MD  LANTUS  SOLOSTAR 100 UNIT/ML Solostar Pen Inject 5 Units into the skin daily. 03/30/24   Singh, Prashant K, MD  NOVOLOG  FLEXPEN 100 UNIT/ML FlexPen Before each meal 3 times a day, 140-199 - 2 units, 200-250 - 4 units, 251-299 - 6 units,  300-349 - 7 units,  350 or above 8 units. 03/30/24   Dennise Lavada POUR, MD  pantoprazole  (PROTONIX ) 40 MG tablet Take 1 tablet (40 mg total) by mouth 2 (two) times daily. 02/07/24 04/10/24  Drusilla Sabas RAMAN, MD  prochlorperazine  (COMPAZINE ) 5 MG tablet Take 5 mg by mouth every 6 (six) hours as needed for nausea or vomiting.    [provider]  rosuvastatin  (CRESTOR ) 40 MG tablet Take 40 mg by mouth daily. 01/20/24 01/19/25  [provider]  sacubitril -valsartan  (ENTRESTO ) 24-26 MG Take 1 tablet by mouth 2 (two) times daily.    [provider]  sevelamer  carbonate (RENVELA ) 800 MG tablet Take 1 tablet (800 mg total) by mouth 3 (three) times daily with meals. 02/07/24  Drusilla Sabas RAMAN, MD  sodium bicarbonate  650 MG tablet Take 650 mg by mouth 2 (two) times daily. 07/29/23   [provider]  SUMAtriptan  (IMITREX ) 50 MG tablet Take 50 mg by mouth every 2 (two) hours as needed for migraine or headache. 06/20/22   [provider]    Allergies: Keflex  [cephalexin ], Penicillins, Vibramycin  [doxycycline ], Benadryl  Egeria.dura ], Dilaudid  [hydromorphone ], Methotrexate and trimetrexate, and Roxicodone  [oxycodone ]    Review of Systems  All other systems reviewed and are negative.   Updated Vital Signs BP  (!) 178/99   Pulse 98   Temp 97.9 F (36.6 C) (Oral)   Resp (!) 28   Ht 5' 3 (1.6 m)   Wt 59.9 kg   SpO2 97%   BMI 23.38 kg/m   Physical Exam Vitals and nursing note reviewed.  Constitutional:      General: She is not in acute distress.    Appearance: She is well-developed. She is not toxic-appearing.  HENT:     Head: Atraumatic.  Cardiovascular:     Rate and Rhythm: Tachycardia present.  Pulmonary:     Effort: Pulmonary effort is normal.     Breath sounds: Normal breath sounds. No rhonchi or rales.  Abdominal:     General: There is distension.     Tenderness: There is abdominal tenderness.     Comments: Generalized abdominal tenderness with distention and positive fluid wave  Musculoskeletal:     Cervical back: Normal range of motion and neck supple.  Skin:    General: Skin is warm and dry.  Neurological:     Mental Status: She is alert and oriented to person, place, and time.     (all labs ordered are listed, but only abnormal results are displayed) Labs Reviewed  COMPREHENSIVE METABOLIC PANEL WITH GFR - Abnormal; Notable for the following components:      Result Value   Sodium 134 (*)    Potassium 6.1 (*)    Chloride 91 (*)    CO2 14 (*)    Glucose, Bld 791 (*)    BUN 42 (*)    Creatinine, Ser 7.18 (*)    Total Protein 5.5 (*)    Albumin  3.1 (*)    Alkaline Phosphatase 472 (*)    Total Bilirubin 2.4 (*)    GFR, Estimated 7 (*)    Anion gap 29 (*)    All other components within normal limits  CBC WITH DIFFERENTIAL/PLATELET - Abnormal; Notable for the following components:   WBC 12.5 (*)    Hemoglobin 11.0 (*)    Neutro Abs 9.1 (*)    All other components within normal limits  PROTIME-INR - Abnormal; Notable for the following components:   Prothrombin Time 18.8 (*)    INR 1.5 (*)    All other components within normal limits  BETA-HYDROXYBUTYRIC ACID - Abnormal; Notable for the following components:   Beta-Hydroxybutyric Acid 6.99 (*)    All other  components within normal limits  I-STAT CG4 LACTIC ACID, ED - Abnormal; Notable for the following components:   Lactic Acid, Venous 3.7 (*)    All other components within normal limits  CBG MONITORING, ED - Abnormal; Notable for the following components:   Glucose-Capillary >600 (*)    All other components within normal limits  I-STAT CHEM 8, ED - Abnormal; Notable for the following components:   Sodium 130 (*)    Potassium 6.3 (*)    BUN 50 (*)    Creatinine, Ser 6.80 (*)  Glucose, Bld >700 (*)    Calcium , Ion 1.01 (*)    TCO2 16 (*)    All other components within normal limits  I-STAT VENOUS BLOOD GAS, ED - Abnormal; Notable for the following components:   pCO2, Ven 28.2 (*)    pO2, Ven 119 (*)    Bicarbonate 15.1 (*)    TCO2 16 (*)    Acid-base deficit 9.0 (*)    Sodium 128 (*)    Potassium 6.2 (*)    Calcium , Ion 1.00 (*)    All other components within normal limits  I-STAT CG4 LACTIC ACID, ED - Abnormal; Notable for the following components:   Lactic Acid, Venous 4.2 (*)    All other components within normal limits  CBG MONITORING, ED - Abnormal; Notable for the following components:   Glucose-Capillary >600 (*)    All other components within normal limits  CBG MONITORING, ED - Abnormal; Notable for the following components:   Glucose-Capillary >600 (*)    All other components within normal limits  CULTURE, BLOOD (SINGLE)  BODY FLUID CULTURE W GRAM STAIN  HCG, SERUM, QUALITATIVE  BETA-HYDROXYBUTYRIC ACID  BETA-HYDROXYBUTYRIC ACID  BETA-HYDROXYBUTYRIC ACID  URINALYSIS, ROUTINE W REFLEX MICROSCOPIC  LACTATE DEHYDROGENASE, PLEURAL OR PERITONEAL FLUID  GLUCOSE, PLEURAL OR PERITONEAL FLUID  PROTEIN, PLEURAL OR PERITONEAL FLUID  ALBUMIN , PLEURAL OR PERITONEAL FLUID   CREATININE, SERUM  TROPONIN I (HIGH SENSITIVITY)  TROPONIN I (HIGH SENSITIVITY)    EKG: EKG Interpretation Date/Time:  Monday April 06 2024 06:44:44 EST Ventricular Rate:  95 PR  Interval:  120 QRS Duration:  90 QT Interval:  405 QTC Calculation: 510 R Axis:   87  Text Interpretation: Sinus rhythm Borderline repolarization abnormality Prolonged QT interval Confirmed by Charlyn Sora ([id number]) on 04/06/2024 9:31:52 AM  Radiology: [facility] Chest Port 1 View Result Date: 04/06/2024 CLINICAL DATA:  Sepsis. EXAM: PORTABLE CHEST 1 VIEW COMPARISON:  03/28/2024 FINDINGS: The cardio pericardial silhouette is enlarged. Focal consolidative opacity at the right base with pleural effusion. Low lung volumes with vascular congestion. No acute bony abnormality. Telemetry leads overlie the chest. IMPRESSION: Focal consolidative opacity at the right base with pleural effusion. Imaging features compatible with pneumonia. Follow-up imaging recommended to ensure resolution. Low lung volumes with cardiomegaly. Electronically Signed   By: Camellia Candle M.D.   On: 04/06/2024 10:23     .Critical Care  Performed by: Charlyn Sora, MD Authorized by: Charlyn Sora, MD   Critical care provider statement:    Critical care time (minutes):  48   Critical care was necessary to treat or prevent imminent or life-threatening deterioration of the following conditions:  Renal failure, metabolic crisis and circulatory failure   Critical care was time spent personally by me on the following activities:  Development of treatment plan with patient or surrogate, discussions with consultants, evaluation of patient's response to treatment, examination of patient, ordering and review of laboratory studies, ordering and review of radiographic studies, ordering and performing treatments and interventions, pulse oximetry, re-evaluation of patient's condition, review of old charts and obtaining history from patient or surrogate    Medications Ordered in the ED  oxyCODONE -acetaminophen  (PERCOCET/ROXICET) 5-325 MG per tablet 1 tablet (1 tablet Oral Patient Refused/Not Given 04/06/24 0757)  insulin  regular, human  (MYXREDLIN ) 100 units/ 100 mL infusion (5.5 Units/hr Intravenous Rate/Dose Verify 04/06/24 1048)  lactated ringers  infusion ( Intravenous Not Given 04/06/24 1023)  dextrose  5 % in lactated ringers  infusion (has no administration in time range)  dextrose  50 %  solution 0-50 mL (has no administration in time range)  sodium zirconium cyclosilicate  (LOKELMA ) packet 10 g (0 g Oral Hold 04/06/24 0940)  Chlorhexidine  Gluconate Cloth 2 % PADS 6 each (has no administration in time range)  oxyCODONE -acetaminophen  (PERCOCET/ROXICET) 5-325 MG per tablet 1 tablet (1 tablet Oral Patient Refused/Not Given 04/06/24 1013)  fentaNYL  (SUBLIMAZE ) injection 50 mcg (50 mcg Intravenous Given 04/06/24 1022)  heparin  injection 5,000 Units (has no administration in time range)  sodium chloride  flush (NS) 0.9 % injection 3 mL (3 mLs Intravenous Not Given 04/06/24 1055)  acetaminophen  (TYLENOL ) tablet 650 mg (has no administration in time range)    Or  acetaminophen  (TYLENOL ) suppository 650 mg (has no administration in time range)  fentaNYL  (SUBLIMAZE ) injection 50 mcg (50 mcg Intravenous Given 04/06/24 0811)  lactated ringers  bolus 1,228 mL (0 mLs Intravenous Stopped 04/06/24 1014)  insulin  aspart (novoLOG ) injection 10 Units (10 Units Intravenous Given 04/06/24 0924)  sodium bicarbonate  injection 50 mEq (50 mEq Intravenous Given 04/06/24 0930)  ondansetron  (ZOFRAN ) injection 4 mg (4 mg Intravenous Given 04/06/24 1022)    Clinical Course as of 04/06/24 1114  Mon Apr 06, 2024  0934 I-Stat venous blood gas, ([facility] ED, [facility], [facility])(!) No acidosis, but patient's bicarb is 16.  Sugar is over 700. [AN]  [id number] Potassium(!!): 6.3 Potassium is 6.3, patient has bicarb of 16 we will start on insulin  drip and treat her for hyper kalemia. [AN]  0934 I-stat chem 8, ED (not at [facility], [facility] or [facility])(!!) Consulted nephrology and spoke with Dr. Conda -patient will be on the dialysis list. [AN]  0934 [facility] Chest [facility] I independently interpreted  patient's chest x-ray, there is increased consolidation on the right side.  Patient however denies any new cough, fevers.  No profound pulmonary edema noted, but does appear to have some interstitial edema, increased vascularization [AN]  1001 Comprehensive metabolic panel(!!) Patient has anion gap DKA.  Will continue with IV insulin  drip.  IR paracentesis order has been put in. [AN]  1114 IR was consulted, and they we will complete diagnostic and therapeutic paracentesis.  Admitting team will follow-up on the results.  Nurse practitioner made aware of IR to finish the paracentesis. [AN]    Clinical Course User Index [AN] Charlyn Sora, MD                                 Medical Decision Making Amount and/or Complexity of Data Reviewed Labs: ordered. Decision-making details documented in ED Course. Radiology: ordered. Decision-making details documented in ED Course.  Risk OTC drugs. Prescription drug management. Decision regarding hospitalization.    33 year old female comes in with chief complaint of missed dialysis, dyspnea, chest and abdominal pain.  Patient has history of ESRD on hemodialysis, diabetes type 1.  On exam, patient is nontoxic-appearing, she has diffuse abdominal tenderness and distention.  I have reviewed patient's records including recent discharge summary.  I have also reviewed patient's recent CT abdomen and pelvis which shows ascites.  Patient does not have any evidence of rales on my exam and no hypoxia.  Differential diagnosis for this patient includes: DKA, HHS, hyperglycemia without ketosis, volume overload including pulmonary edema, ascites, SBP.  Patient had paracentesis completed last time when she was in the hospital.   Patient has nonfocal, generalized abdominal tenderness.  At this time I do not think CT scan will be helpful unless.  I believe her abdominal discomfort and  also the shortness of breath is worse because of the ascites.   Final  diagnoses:  Type 1 diabetes mellitus with ketoacidosis without coma (HCC)  Other ascites  Dyspnea and respiratory abnormalities  Hypervolemia, unspecified hypervolemia type  ESRD (end stage renal disease) on dialysis [facility])    ED Discharge Orders     None          Charlyn Sora, MD 04/06/24 1114

## 2024-04-06 NOTE — Procedures (Signed)
 HD Note:  Some information was entered later than the data was gathered due to patient care needs. The stated time with the data is accurate.  Received patient in bed to unit.   Alert and oriented.  Patient reluctant to interact.  Patient responses were very low and hard to hear.  Informed consent signed and in chart.   Access used: Upper left arm fistula Access issues: No issues  Patient complained of pain in her abdomen and all over when arriving on the unit.  This clinical research associate explained that she was not alert enough for me to give her more pain medicine at that point.   Further into the treatment, the patient was yelling out that she needed pain medication.  Medication given, see MAR  TX duration: 3.5 hours  Alert, without acute distress.  Total UF removed: 5000 ml  Hand-off given to patient's nurse.   Transported to her assigned room   Margert Edsall L. Lenon, RN Kid[facility] D[facility] Unit.

## 2024-04-06 NOTE — Consult Note (Signed)
 Renal Service Consult Note [facility] Ashley JONETTA Fret, MD  Patient: Ashley Freeman Date: 04/06/2024 Requesting Physician: Dr. Charlyn  Reason for Consult: ESRD pt w/ high BS's > 600 at home HPI: The patient is a 33 y.o. year-old w/ PMH as below who presented to ED this morning c/o high BS's at home. Also c/o CP, SOB and back pains when lying flat. In ED labs showed K+ 6.1, bun 42, creat 7.18, CO2 14, Na 134, alb 3.1, tbili 2.4, WBC 12, Hb 11. CXR negative. BP 150/90, hR 95, RR 16-21. Pt was given temporizing measures for high K+, and started on IV insulin . We are asked to see for dialysis.    Pt seen in the ED room.  As usual when the patient is sick she will interact minimally on a verbal basis.  Does say she has been having chest pain and stomach pain and back pain, no details.  Not sure when her last dialysis was.   ROS -  no joint pain, no HA, no blurry vision, no rash, no diarrhea   Past Medical History  Past Medical History:  Diagnosis Date   Abscess, gluteal, right 08/24/2013   Anemia 02/19/2012   Bartholin's gland abscess 09/19/2013   Bipolar disorder (HCC)    BV (bacterial vaginosis) 11/24/2015   Depression    Diabetes mellitus type I (HCC) 2001   Diagnosed at age 74 ; Type I   Diarrhea 05/30/2016   DKA (diabetic ketoacidoses) 08/19/2013   Also in 2018   ESRD (end stage renal disease) (HCC)    Gonorrhea 08/2011   Treated in 09/2011   HFrEF (heart failure with reduced ejection fraction) (HCC)    a. 2022 Echo: EF 40%; b. 10/2021 Echo: EF 55%; b. 07/2022 MV: No ischemia. EF 31%; c. 08/2022 Echo: EF 35%, mildly dil RV, sev TR.   History of trichomoniasis 05/31/2016   Hyperlipidemia 03/28/2016   Hypertension    NICM (nonischemic cardiomyopathy) (HCC)    Sepsis (HCC) 09/19/2013   Past Surgical History  Past Surgical History:  Procedure Laterality Date   A/V FISTULAGRAM Right 06/17/2023   Procedure: A/V Fistulagram;  Surgeon: Marea Selinda GORMAN,  MD;  Location: [facility] INVASIVE CV LAB;  Service: Cardiovascular;  Laterality: Right;   A/V SHUNT INTERVENTION N/A 09/25/2023   Procedure: A/V SHUNT INTERVENTION;  Surgeon: Pearline Norman GORMAN, MD;  Location: [facility] PV LAB;  Service: Cardiovascular;  Laterality: N/A;   AV FISTULA PLACEMENT Right 07/06/2022   Procedure: ARTERIOVENOUS GRAFT CREATION;  Surgeon: Gretta Lonni PARAS, MD;  Location: [facility] OR;  Service: Vascular;  Laterality: Right;   CESAREAN SECTION N/A 10/05/2019   Procedure: CESAREAN SECTION;  Surgeon: Izell Harari, MD;  Location: [facility] LD ORS;  Service: Obstetrics;  Laterality: N/A;   CHOLECYSTECTOMY N/A 07/02/2023   Procedure: LAPAROSCOPIC CHOLECYSTECTOMY;  Surgeon: Ebbie Cough, MD;  Location: [facility] OR;  Service: General;  Laterality: N/A;   ESOPHAGOGASTRODUODENOSCOPY N/A 01/31/2024   Procedure: EGD (ESOPHAGOGASTRODUODENOSCOPY);  Surgeon: San Sandor GAILS, DO;  Location: [facility] ENDOSCOPY;  Service: Gastroenterology;  Laterality: N/A;   INCISION AND DRAINAGE ABSCESS Left 09/28/2019   Procedure: INCISION AND DRAINAGE VULVAR ABCESS;  Surgeon: Edsel Norleen GAILS, MD;  Location: [facility] OR;  Service: Gynecology;  Laterality: Left;   INCISION AND DRAINAGE ABSCESS Right 02/23/2024   Procedure: INCISION AND DRAINAGE, ABSCESS;  Surgeon: Tobie Eldora NOVAK, MD;  Location: [facility] OR;  Service: ENT;  Laterality: Right;   INCISION AND DRAINAGE PERIRECTAL ABSCESS Right 08/18/2013   Procedure:  IRRIGATION AND DEBRIDEMENT GLUTEAL ABSCESS;  Surgeon: Lynda Leos, MD;  Location: [facility] OR;  Service: General;  Laterality: Right;   INCISION AND DRAINAGE PERIRECTAL ABSCESS Right 09/19/2013   Procedure: IRRIGATION AND DEBRIDEMENT RIGHT GLUTEAL AND LABIAL ABSCESSES;  Surgeon: Lynda Leos, MD;  Location: [facility] OR;  Service: General;  Laterality: Right;   INCISION AND DRAINAGE PERIRECTAL ABSCESS Right 09/24/2013   Procedure: IRRIGATION AND DEBRIDEMENT PERIRECTAL ABSCESS;  Surgeon: Lynwood MALVA Pina, MD;  Location: [facility] OR;  Service:  General;  Laterality: Right;   IR PARACENTESIS  08/28/2023   IR PARACENTESIS  11/04/2023   IR PARACENTESIS  12/23/2023   IR PARACENTESIS  02/04/2024   IR PARACENTESIS  03/23/2024   Family History  Family History  Problem Relation Age of Onset   Asthma Mother    Carpal tunnel syndrome Mother    Gout Father    Diabetes Paternal Grandmother    Anesthesia problems Neg Hx    Social History  reports that she has never smoked. She has never been exposed to tobacco smoke. She has never used smokeless tobacco. She reports that she does not currently use alcohol . She reports that she does not use drugs. Allergies  Allergies  Allergen Reactions   Keflex  [Cephalexin ] Anaphylaxis    Ceftriaxone  in the past with no reaction   Penicillins Anaphylaxis, Hives and Rash   Vibramycin  [Doxycycline ] Anaphylaxis   Benadryl  [Diphenhydramine ] Itching   Dilaudid  [Hydromorphone ] Itching   Methotrexate And Trimetrexate Rash   Roxicodone  [Oxycodone ] Itching    Takes Percocet without issue   Home medications Prior to Admission medications   Medication Sig Start Date End Date Taking? Authorizing Provider  albuterol  (VENTOLIN  HFA) 108 (90 Base) MCG/ACT inhaler Inhale 2 puffs into the lungs every 4 (four) hours as needed for wheezing or shortness of breath. 11/24/22   [provider]  bumetanide  (BUMEX ) 2 MG tablet Take 10 mg by mouth daily.    [provider]  carvedilol  (COREG ) 25 MG tablet Take 25 mg by mouth 2 (two) times daily with a meal.    [provider]  Cinnamon 500 MG capsule Take 500 mg by mouth in the morning.    [provider]  DULoxetine  (CYMBALTA ) 20 MG capsule Take 20 mg by mouth daily. 07/29/23   [provider]  Elderberry-Vitamin C-Zinc  (ELDERBERRY IMMUNE HEALTH GUMMY PO) Take 2 each by mouth in the morning.    [provider]  fluticasone  (FLONASE ) 50 MCG/ACT nasal spray Place 2 sprays into both nostrils daily as needed for allergies or  rhinitis. 12/11/23 12/10/24  [provider]  hydrOXYzine  (ATARAX ) 25 MG tablet Take 25 mg by mouth 3 (three) times daily as needed for anxiety, itching, nausea or vomiting.    [provider]  lamoTRIgine  (LAMICTAL ) 200 MG tablet Take 1 tablet (200 mg total) by mouth daily. 10/29/23   Regalado, Belkys A, MD  LANTUS  SOLOSTAR 100 UNIT/ML Solostar Pen Inject 5 Units into the skin daily. 03/30/24   Singh, Prashant K, MD  NOVOLOG  FLEXPEN 100 UNIT/ML FlexPen Before each meal 3 times a day, 140-199 - 2 units, 200-250 - 4 units, 251-299 - 6 units,  300-349 - 7 units,  350 or above 8 units. 03/30/24   Dennise Lavada POUR, MD  pantoprazole  (PROTONIX ) 40 MG tablet Take 1 tablet (40 mg total) by mouth 2 (two) times daily. 02/07/24 04/10/24  Drusilla Sabas RAMAN, MD  prochlorperazine  (COMPAZINE ) 5 MG tablet Take 5 mg by mouth every 6 (six) hours  as needed for nausea or vomiting.    [provider]  rosuvastatin  (CRESTOR ) 40 MG tablet Take 40 mg by mouth daily. 01/20/24 01/19/25  [provider]  sacubitril -valsartan  (ENTRESTO ) 24-26 MG Take 1 tablet by mouth 2 (two) times daily.    [provider]  sevelamer  carbonate (RENVELA ) 800 MG tablet Take 1 tablet (800 mg total) by mouth 3 (three) times daily with meals. 02/07/24   Drusilla Sabas RAMAN, MD  sodium bicarbonate  650 MG tablet Take 650 mg by mouth 2 (two) times daily. 07/29/23   [provider]  SUMAtriptan  (IMITREX ) 50 MG tablet Take 50 mg by mouth every 2 (two) hours as needed for migraine or headache. 06/20/22   [provider]     Vitals:   04/06/24 9365 04/06/24 0646  BP:  (!) 149/91  Pulse:  97  Resp:  16  Temp:  97.9 F (36.6 C)  TempSrc:  Oral  SpO2: 95% 99%   Exam Gen alert, no distress, on RA Sclera anicteric, throat clear  No jvd or bruits Chest clear bilat to bases RRR no MRG Abd soft ntnd no mass, 2-3+ non-tender ascites Ext no LE or UE edema, no other edema Neuro is alert, Ox 3 , nf       RUE AVG + bruit   Home bp meds: Coreg , bumex , entresto    OP HD: [facility] TTS AVG  Heparin  1600 +600/hr   Istat -> K+ 6.3, creat 6.8, bun 50, LA 3.7 BP 150/90,  HR 94, RR 15   Assessment/ Plan: Hyperkalemia: K+ 6.3, in setting of uncont DM, poss DKA. K+ will come down w/ IV insulin  and/or dialysis. Also lokelma  x 1 has been ordered. Plan is for HD.  Uncont DM1: BS > 600, on IV insulin  for ^K+ per ED. Have d/w pmd - will need to be off IV insulin  for HD upstairs and she agrees that is okay.  ESRD: on HD TTS. Last HD Sat. B/Cr unremarkable. HD today upstairs.  HTN: bp's slightly high. Get vol down w/ HD. Can resume home BP meds post HD prn.  Volume overload: ascites mostly, no pulm or LE edema, this is a recurrent issue. CXR neg, on RA. Max UF w/ HD today.  Anemia of esrd: Hb 12-13, no esa needs   Myer Fret  MD [facility] 04/06/2024, 9:02 AM  Recent Labs  Lab 04/06/24 0824 04/06/24 0826  HGB 12.9 12.9  CREATININE  --  6.80*  K 6.2* 6.3*   Inpatient medications:  insulin  aspart  10 Units Intravenous Once   oxyCODONE -acetaminophen   1 tablet Oral Once   sodium bicarbonate   50 mEq Intravenous Once   sodium zirconium cyclosilicate   10 g Oral Once    dextrose  5% lactated ringers      insulin      lactated ringers      lactated ringers      dextrose 

## 2024-04-06 NOTE — Procedures (Signed)
 PROCEDURE SUMMARY:  Successful image-guided paracentesis from the left abdomen.  Yielded 3.5 liters of clear, straw-colored peritoneal fluid.  No immediate complications.  EBL: zero Patient tolerated well.   Specimen was sent for labs.  Please see imaging section of Epic for full dictation.  Carlin LABOR Greydis Stlouis PA-C 04/06/2024 2:54 PM

## 2024-04-06 NOTE — Inpatient Diabetes Management (Signed)
 Inpatient Diabetes Program Recommendations  AACE/ADA: New Consensus Statement on Inpatient Glycemic Control  Target Ranges:  Prepandial:   less than 140 mg/dL      Peak postprandial:   less than 180 mg/dL (1-2 hours)      Critically ill patients:  140 - 180 mg/dL   Lab Results  Component Value Date   GLUCAP >600 (HH) 04/06/2024   HGBA1C 8.5 (H) 03/30/2024    Latest Reference Range & Units 04/06/24 08:08 04/06/24 08:26  Glucose 70 - 99 mg/dL 208 (HH) >299 (HH)    Latest Reference Range & Units 04/06/24 08:08 04/06/24 08:26  Potassium 3.5 - 5.1 mmol/L 6.1 (H) 6.3 (HH)  Chloride 98 - 111 mmol/L 91 (L) 98  CO2 22 - 32 mmol/L 14 (L)   Glucose 70 - 99 mg/dL 208 (HH) >299 (HH)  BUN 6 - 20 mg/dL 42 (H) 50 (H)  Creatinine 0.44 - 1.00 mg/dL 2.81 (H) 3.19 (H)  Calcium  8.9 - 10.3 mg/dL 9.2   Anion gap 5 - 15  29 (H)     Latest Reference Range & Units 04/06/24 08:25 04/06/24 09:44  Lactic Acid, Venous 0.5 - 1.9 mmol/L 3.7 (HH) 4.2 (HH)    Review of Glycemic Control  Diabetes history: DM1  Outpatient Diabetes medications:  Dexcom G7 CGM  Lantus  5 units daily  Novolog  2-8 units TID    Current orders for Inpatient glycemic control:  IV Insulin    Inpatient Diabetes Program Recommendations:   - Diabetes Coordinator recently spoke with patient during previous admission on 03/30/24.  - Noted IV Insulin  has been ordered.   Team will continue to follow.   Thanks,  Lavanda Search, RN, MSN, [facility]  Inpatient Diabetes Coordinator  Pager [phone] (8a-5p)

## 2024-04-06 NOTE — ED Notes (Signed)
 Pt refused oral oxycodone . IV team at bedside. Pt wants to wait for IV fentanyl  for pain management.

## 2024-04-06 NOTE — ED Triage Notes (Signed)
 Pt been having symptoms since missing dialysis on Saturday. Refused an iv for EMS blood glucose reading greater than 600 (HIGH) on ems.

## 2024-04-06 NOTE — ED Notes (Signed)
 Pt refusing labs and xray d/t pain from laying flat on back, requesting pain meds.

## 2024-04-06 NOTE — ED Notes (Signed)
 Lactic results to sara a.rn by at

## 2024-04-07 LAB — COMPREHENSIVE METABOLIC PANEL WITH GFR
ALT: 20 U/L (ref 0–44)
AST: 18 U/L (ref 15–41)
Albumin: 2.4 g/dL — ABNORMAL LOW (ref 3.5–5.0)
Alkaline Phosphatase: 370 U/L — ABNORMAL HIGH (ref 38–126)
Anion gap: 19 — ABNORMAL HIGH (ref 5–15)
BUN: 24 mg/dL — ABNORMAL HIGH (ref 6–20)
CO2: 23 mmol/L (ref 22–32)
Calcium: 8.4 mg/dL — ABNORMAL LOW (ref 8.9–10.3)
Chloride: 97 mmol/L — ABNORMAL LOW (ref 98–111)
Creatinine, Ser: 5.2 mg/dL — ABNORMAL HIGH (ref 0.44–1.00)
GFR, Estimated: 11 mL/min — ABNORMAL LOW (ref >60)
Glucose, Bld: 41 mg/dL — CL (ref 70–99)
Potassium: 3.9 mmol/L (ref 3.5–5.1)
Sodium: 139 mmol/L (ref 135–145)
Total Bilirubin: 1.1 mg/dL (ref 0.0–1.2)
Total Protein: 4.7 g/dL — ABNORMAL LOW (ref 6.5–8.1)

## 2024-04-07 LAB — GLUCOSE, CAPILLARY
Glucose-Capillary: 117 mg/dL — ABNORMAL HIGH (ref 70–99)
Glucose-Capillary: 134 mg/dL — ABNORMAL HIGH (ref 70–99)
Glucose-Capillary: 146 mg/dL — ABNORMAL HIGH (ref 70–99)
Glucose-Capillary: 156 mg/dL — ABNORMAL HIGH (ref 70–99)
Glucose-Capillary: 19 mg/dL — CL (ref 70–99)
Glucose-Capillary: 219 mg/dL — ABNORMAL HIGH (ref 70–99)
Glucose-Capillary: 219 mg/dL — ABNORMAL HIGH (ref 70–99)
Glucose-Capillary: 29 mg/dL — CL (ref 70–99)
Glucose-Capillary: 74 mg/dL (ref 70–99)
Glucose-Capillary: 91 mg/dL (ref 70–99)
Glucose-Capillary: 98 mg/dL (ref 70–99)

## 2024-04-07 LAB — PATHOLOGIST SMEAR REVIEW

## 2024-04-07 LAB — BETA-HYDROXYBUTYRIC ACID: Beta-Hydroxybutyric Acid: 0.23 mmol/L (ref 0.05–0.27)

## 2024-04-07 MED ORDER — INSULIN GLARGINE 100 UNIT/ML ~~LOC~~ SOLN
8.0000 [IU] | Freq: Every day | SUBCUTANEOUS | Status: DC
  Filled 2024-04-07: qty 0.08

## 2024-04-07 MED ORDER — INSULIN ASPART 100 UNIT/ML IJ SOLN
0.0000 [IU] | Freq: Three times a day (TID) | INTRAMUSCULAR | Status: DC
  Administered 2024-04-07: 3 [IU] via SUBCUTANEOUS
  Administered 2024-04-08: 2 [IU] via SUBCUTANEOUS
  Filled 2024-04-07: qty 2
  Filled 2024-04-07: qty 3

## 2024-04-07 MED ORDER — DULOXETINE HCL 20 MG PO CPEP
20.0000 mg | ORAL_CAPSULE | Freq: Every day | ORAL | Status: DC
  Administered 2024-04-07 – 2024-04-08 (×2): 20 mg via ORAL
  Filled 2024-04-07 (×2): qty 1

## 2024-04-07 MED ORDER — HYDRALAZINE HCL 20 MG/ML IJ SOLN: 10.0000 mg | Freq: Four times a day (QID) | INTRAMUSCULAR | Status: DC | PRN

## 2024-04-07 MED ORDER — BUMETANIDE 2 MG PO TABS
10.0000 mg | ORAL_TABLET | Freq: Every day | ORAL | Status: DC
  Administered 2024-04-07 – 2024-04-08 (×2): 10 mg via ORAL
  Filled 2024-04-07 (×2): qty 5

## 2024-04-07 MED ORDER — INSULIN GLARGINE 100 UNIT/ML ~~LOC~~ SOLN
6.0000 [IU] | Freq: Every day | SUBCUTANEOUS | Status: DC
  Administered 2024-04-07: 6 [IU] via SUBCUTANEOUS
  Filled 2024-04-07 (×2): qty 0.06

## 2024-04-07 MED ORDER — DEXTROSE 50 % IV SOLN
INTRAVENOUS | Status: AC
  Filled 2024-04-07: qty 50

## 2024-04-07 MED ORDER — AMLODIPINE BESYLATE 10 MG PO TABS
10.0000 mg | ORAL_TABLET | Freq: Every day | ORAL | Status: DC
  Administered 2024-04-07: 10 mg via ORAL
  Filled 2024-04-07: qty 1

## 2024-04-07 MED ORDER — LAMOTRIGINE 100 MG PO TABS
200.0000 mg | ORAL_TABLET | Freq: Every day | ORAL | Status: DC
  Administered 2024-04-07 – 2024-04-08 (×2): 200 mg via ORAL
  Filled 2024-04-07 (×2): qty 2

## 2024-04-07 MED ORDER — GLUCAGON HCL RDNA (DIAGNOSTIC) 1 MG IJ SOLR
1.0000 mg | Freq: Once | INTRAMUSCULAR | Status: AC
  Administered 2024-04-07: 1 mg via INTRAVENOUS
  Filled 2024-04-07: qty 1

## 2024-04-07 NOTE — Progress Notes (Addendum)
 PROGRESS NOTE     Patient Demographics:    Ashley Freeman, is a 33 y.o. female, DOB - 12-13-1990, FMW:[id number]  Outpatient Primary MD for the patient is [name], [name], MD    LOS - 1  Admit date - 04/06/2024    Chief Complaint  Patient presents with   Hyperglycemia    Pt has bg over 600 missed dialysis Saturday and is having abdomin and chest pain        Brief Narrative (HPI from H&P)    33 y.o. 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  Patient was recently discharged on 12/1 after an admission for positive SIRS physiology without infectious process and hypoglycemia.  At that presentation her CBG was 35 in the field and it was documented she had also not been checking her CBGs for multiple months and had been taking her insulin  once every 3 to 4 days and not eating adequately.  She underwent paracentesis for her ascites and 6 L was removed.  Her SAAG was consistent with hepatic etiology to her ascites and GI evaluation in the outpatient setting is pending.  Note patient is highly noncompliant with her insulin  and dialysis regimen on a persistent basis requiring multiple hospital admissions.  Continues to be noncompliant with both.  She presented via EMS back to the ED with reports of abdominal distention, abdominal and chest pain after missing dialysis on Saturday, she was found to have significant ascites and elevated blood sugars and was admitted to the hospital.   Subjective:   Patient in bed,  appears comfortable, denies any headache, no fever, no chest pain or pressure, no shortness of breath , no abdominal pain. No focal weakness.   Assessment  & Plan :   1.  Recurrent ascites with mild transaminitis.  Negative acute hepatitis panel recently, on imaging no signs of cirrhosis, underwent ultrasound-guided paracentesis on 04/06/2024 with 3.5 L of fluid removed, SAAG levels not  suggestive of portal hypertension, case discussed in detail with gastroenterologist Dr. Leigh, recurrent ascites is due to third spacing of fluid from missed dialysis sessions and fluid overload, no signs of SBP, patient has been counseled to be compliant with her medications and HD treatments, monitor with supportive care.  Fluid removal via HD and resume Bumex  at home dose.  Was discharged follow-up with [facility].   2.  DKA in a patient with DM type I who is noncompliant with her insulin  regimen on a persistent basis with very brittle diabetes - Was treated with DKA protocol, transition to subcu insulin  but CBGs running low overnight, will cut down her insulin  regimen, she has history of extremely brittle DM type I and we should provide room for some permissive hyperglycemia, again patient counseled strictly on compliance with her insulin  and her HD schedule.  Glucagon  administered 8 AM on 04/07/2024 for persistent hypoglycemia.  CBG (last 3)  Recent Labs    04/07/24 0555 04/07/24 0614 04/07/24 0726  GLUCAP 19* 146* 91    Latest Reference Range & Units 04/06/24 21:03 04/06/24 23:55 04/07/24 00:22 04/07/24 00:54 04/07/24 05:55 04/07/24 06:14 04/07/24 07:26 04/07/24 08:44  Glucose-Capillary 70 - 99 mg/dL 762 (H) 35 (LL) 29 (LL) 117 (H) 19 (LL) 146 (H) 91 74  (LL): Data is critically low (H): Data is abnormally high  Lab Results  Component Value Date   HGBA1C 8.5 (H) 03/30/2024     3. ESRD  - Extreme noncompliance with dialysis sessions with fluid overload, counseled, nephrology on board   4.  Acute  on chronic chronic HFrEF  - EF was 30-35% in May 2025  - Continue Bumex , Coreg ,, for now hold Entresto  till she is routinely on HD with stable K levels.  5. Bipolar disorder  - Continue Atarax , Lamictal , obtain psych input for her underlying bipolar disorder and extreme noncompliance with HD treatments and insulin  which is life-threatening.  Will request psych to evaluate her capacity as well.  6. HTN -on Coreg  will add Norvasc  and as needed hydralazine  for better control.   7.  Dyslipidemia.  Replaced on statin.       Condition - Guarded  Family Communication  :  None present  Code Status :  Full  Consults  :  Renal, psych  PUD Prophylaxis :  PPI   Procedures  :            Disposition Plan  :    Status is: Observation  DVT Prophylaxis  :    heparin  injection 5,000 Units Start: 04/06/24 1400    Lab Results  Component Value Date   PLT 293 04/06/2024    Diet :  Diet Order             Diet Carb Modified Fluid consistency: Thin; Room service appropriate? Yes  Diet effective now                    Inpatient Medications  Scheduled Meds:  amLODipine   10 mg Oral Daily   bumetanide   10 mg Oral Daily   carvedilol   25 mg Oral BID WC   Chlorhexidine  Gluconate Cloth  6 each Topical Q0600   DULoxetine   20 mg Oral Daily   glucagon  (human recombinant)  1 mg Intravenous Once   heparin   5,000 Units Subcutaneous Q8H   lamoTRIgine   200 mg Oral Daily   pantoprazole   40 mg Oral BID   patiromer   16.8 g Oral Daily   rosuvastatin   40 mg Oral Daily   sevelamer  carbonate  800 mg Oral TID WC   sodium bicarbonate   650 mg Oral BID   sodium chloride  flush  3 mL Intravenous Q12H   Continuous Infusions:  cefTRIAXone  (ROCEPHIN )  IV     PRN Meds:.acetaminophen  **OR** acetaminophen , dextrose , fentaNYL  (SUBLIMAZE ) injection, hydrALAZINE   Antibiotics  :    Anti-infectives (From admission, onward)    Start     Dose/Rate Route Frequency Ordered Stop   04/06/24 1130   cefTRIAXone  (ROCEPHIN ) 2 g in sodium chloride  0.9 % 100 mL IVPB        2 g 200 mL/hr over 30 Minutes Intravenous Every 24 hours 04/06/24 1120           Objective:   Vitals:   04/06/24 2247 04/07/24 0332 04/07/24 0500 04/07/24 0725  BP: (!) 123/108   119/84  Pulse: (!) 102 69    Resp:      Temp: 98.4 F (36.9 C) (!) 97.4 F (36.3 C)  97.6 F (36.4 C)  TempSrc: Oral Oral  Oral  SpO2:      Weight:   59.2 kg   Height:        Wt Readings from Last 3 Encounters:  04/07/24 59.2 kg  03/29/24 61.4 kg  03/24/24 59.7 kg     Intake/Output Summary (Last 24 hours) at 04/07/2024 0810 Last data filed at 04/06/2024 1749 Gross per 24 hour  Intake 300 ml  Output 5000 ml  Net -4700 ml     Physical Exam  Awake Alert, No new F.N deficits, Normal affect [place].AT,PERRAL Supple Neck, No JVD,   Symmetrical Chest wall movement, Good air movement bilaterally, CTAB RRR,No Gallops,Rubs or new Murmurs,  +ve B.Sounds, Abd Soft, No tenderness,   No Cyanosis, Clubbing or edema        Data Review:    Recent Labs  Lab 04/06/24 0808 04/06/24 0824 04/06/24 0826  WBC 12.5*  --   --   HGB 11.0* 12.9 12.9  HCT 36.0 38.0 38.0  PLT 293  --   --   MCV 87.2  --   --   MCH 26.6  --   --   MCHC 30.6  --   --   RDW 14.9  --   --   LYMPHSABS 2.5  --   --   MONOABS 0.7  --   --   EOSABS 0.1  --   --   BASOSABS 0.1  --   --     Recent Labs  Lab 04/06/24 0808 04/06/24 0824 04/06/24 0825 04/06/24 0826 04/06/24 0935 04/06/24 0944 04/07/24 0323  NA 134* 128*  --  130*  --   --  139  K 6.1* 6.2*  --  6.3*  --   --  3.9  CL 91*  --   --  98  --   --  97*  CO2 14*  --   --   --   --   --  23  ANIONGAP 29*  --   --   --   --   --  19*  GLUCOSE 791*  --   --  >700*  --   --  41*  BUN 42*  --   --  50*  --   --  24*  CREATININE 7.18*  --   --  6.80* 7.42*  --  5.20*  AST 28  --   --   --   --   --  18  ALT 28  --   --   --   --   --  20  ALKPHOS 472*  --   --   --   --   --  370*  BILITOT  2.4*  --   --   --   --   --  1.1  ALBUMIN  3.1*  --   --   --   --   --  2.4*  LATICACIDVEN  --   --  3.7*  --   --  4.2*  --   INR 1.5*  --   --   --   --   --   --   CALCIUM  9.2  --   --   --   --   --  8.4*      Recent Labs  Lab 04/06/24 0808 04/06/24 0825 04/06/24 0944 04/07/24 0323  LATICACIDVEN  --  3.7* 4.2*  --   INR 1.5*  --   --   --   CALCIUM  9.2  --   --  8.4*     Micro Results Recent Results (from the past 240 hours)  Blood culture (routine x 2)     Status: None   Collection Time: 03/29/24 12:25 AM   Specimen: BLOOD  Result Value Ref Range Status   Specimen Description BLOOD RIGHT ANTECUBITAL  Final   Special Requests   Final    BOTTLES DRAWN AEROBIC AND ANAEROBIC Blood Culture adequate volume   Culture   Final    NO GROWTH 5 DAYS Performed at [facility], [address] [place], [place] [address]    Report Status 04/03/2024 FINAL  Final  Blood culture (routine x 2)     Status: None   Collection Time: 03/29/24 12:30 AM   Specimen: BLOOD  Result Value Ref Range Status   Specimen Description BLOOD LEFT ANTECUBITAL  Final   Special Requests   Final    BOTTLES DRAWN AEROBIC AND ANAEROBIC Blood Culture adequate volume   Culture   Final    NO GROWTH 5 DAYS Performed at [facility], [address] [place], [place] [address]    Report Status 04/03/2024 FINAL  Final  Body fluid culture w Gram Stain     Status: None (Preliminary result)   Collection Time: 04/06/24 11:22 AM   Specimen: Abdomen; Peritoneal Fluid  Result Value Ref Range Status   Specimen Description PERITONEAL  Final   Special Requests ABDOMEN  Final   Gram Stain NO WBC SEEN NO ORGANISMS SEEN   Final   Culture   Final    NO GROWTH < 24 HOURS Performed at [facility], [address], [place], [place] [address]    Report Status PENDING  Incomplete    Radiology Report IR Paracentesis Result Date: 04/06/2024 INDICATION: 33 year old female with history of ESRD, on HD,  with recurrent ascites. IR is requested for diagnostic and therapeutic paracentesis. EXAM: ULTRASOUND GUIDED DIAGNOSTIC AND THERAPEUTIC PARACENTESIS MEDICATIONS: 5 cc of 1% lidocaine . COMPLICATIONS: None immediate. PROCEDURE: Informed written consent was obtained from the patient after a discussion of the risks, benefits and alternatives to treatment. A timeout was performed prior to the initiation of the procedure. Initial ultrasound scanning demonstrates a large amount of ascites within the right lower abdominal quadrant. The right lower abdomen was prepped and draped in the usual sterile fashion. 1% lidocaine  was used for local anesthesia. Following this, a 19 gauge, 7-cm, Yueh catheter  was introduced. An ultrasound image was saved for documentation purposes. The paracentesis was performed. The catheter was removed and a dressing was applied. The patient tolerated the procedure well without immediate post procedural complication. FINDINGS: A total of approximately 3.5 L of clear, straw-colored fluid was removed. Samples were sent to the laboratory as requested by the clinical team. IMPRESSION: Successful ultrasound-guided paracentesis yielding 3.5 liters of peritoneal fluid. Performed by: Carlin Griffon, PA-C Electronically Signed   By: Wilkie Lent M.D.   On: 04/06/2024 14:59   DG Chest Port 1 View Result Date: 04/06/2024 CLINICAL DATA:  Sepsis. EXAM: PORTABLE CHEST 1 VIEW COMPARISON:  03/28/2024 FINDINGS: The cardio pericardial silhouette is enlarged. Focal consolidative opacity at the right base with pleural effusion. Low lung volumes with vascular congestion. No acute bony abnormality. Telemetry leads overlie the chest. IMPRESSION: Focal consolidative opacity at the right base with pleural effusion. Imaging features compatible with pneumonia. Follow-up imaging recommended to ensure resolution. Low lung volumes with cardiomegaly. Electronically Signed   By: Camellia Candle M.D.   On: 04/06/2024 10:23      Signature  -   Lavada Stank M.D on 04/07/2024 at 8:10 AM   -  To page go to [email or web address]

## 2024-04-07 NOTE — Inpatient Diabetes Management (Signed)
 Inpatient Diabetes Program Recommendations  AACE/ADA: New Consensus Statement on Inpatient Glycemic Control (2015)  Target Ranges:  Prepandial:   less than 140 mg/dL      Peak postprandial:   less than 180 mg/dL (1-2 hours)      Critically ill patients:  140 - 180 mg/dL   Lab Results  Component Value Date   GLUCAP 219 (H) 04/07/2024   HGBA1C 8.5 (H) 03/30/2024    Latest Reference Range & Units 04/07/24 00:22 04/07/24 00:54 04/07/24 05:55 04/07/24 06:14 04/07/24 07:26 04/07/24 08:44 04/07/24 09:59  Glucose-Capillary 70 - 99 mg/dL 29 (LL) 882 (H) 19 (LL) 146 (H) 91 74 219 (H)  (LL): Data is critically low (H): Data is abnormally high Review of Glycemic Control  Diabetes history: DM1   Outpatient Diabetes medications:  Dexcom G7 CGM  Lantus  5 units daily  Novolog  2-8 units TID     Current orders for Inpatient glycemic control:  IV Insulin     Inpatient Diabetes Program Recommendations:    - Noted hypoglycemia.   Since patient has Type 1 diabetes, will require basal insulin .  Please consider starting Semglee  3 units daily and decreasing Novolog  correction to 0-6 units TID.   Thanks,  Lavanda Search, RN, MSN, [facility]  Inpatient Diabetes Coordinator  Pager [phone] (8a-5p)

## 2024-04-07 NOTE — Consult Note (Signed)
  Psychiatry consult received for capacity evaluation and psychiatric review to determine whether underlying mental health conditions are impacting the patient's decision-making ability. This provider attempted to assess the patient on two separate occasions; however, the patient was unable to participate meaningfully in the evaluation. She briefly opened her eyes when her name was called but was unable to maintain wakefulness long enough to complete the assessment. A reattempt will be made tomorrow to allow for a more thorough evaluation. It appears the patient will remain hospitalized for at least the next day given her current admission for diabetic ketoacidosis (DKA). Chart review indicates this is approximately her tenth hospital admission for this condition. She is also noted to be a high emergency department utilizer, at times presenting to the ED for dialysis-related concerns.

## 2024-04-07 NOTE — Progress Notes (Signed)
 Hypoglycemic Event  CBG: 29 at 0022  Treatment: D50 50 mL (25 gm)  Symptoms: Sweaty  Follow-up CBG: Time:0054 CBG Result:117  Possible Reasons for Event: Inadequate meal intake and Medication regimen:    Comments/MD notified:na    Ashley Freeman [name] Mon

## 2024-04-07 NOTE — TOC Initial Note (Signed)
 Transition of Care [facility]) - Initial/Assessment Note    Patient Details  Name: Ashley Freeman MRN: [id number] Date of Birth: 1990-08-15  [facility] CM/SW Contact:    Landry DELENA Senters, RN Phone Number: 04/07/2024, 1:37 PM  Clinical Narrative:                 RR:[id number] history significant of end-stage renal disease dialysis dependent, bipolar disorder, depression, type 1 diabetes, hypertension, hyperlipidemia and nonischemic cardiomyopathy with type II diastolic dysfunction who presented to the hospital with complaint of increasing difficulty breathing as well as abdominal pain and shortness of breath.  She states that she has not been feeling generally well at home and that she had missed a few of her dialysis sessions.   Patient lives at home with her mom, who provides support and does help with transportation. Patient has transportation arranged through medicaid, which she takes to dialysis appts.   Patient has no DME at home, does have PCP, manages own medications.   Continued medical workup.   CM will continue to follow.   Expected Discharge Plan:  (TBD) Barriers to Discharge: Continued Medical Work up   Patient Goals and CMS Choice            Expected Discharge Plan and Services                                              Prior Living Arrangements/Services   Lives with:: Self Patient language and need for interpreter reviewed:: Yes Do you feel safe going back to the place where you live?: Yes      Need for Family Participation in Patient Care: Yes (Comment) Care giver support system in place?: Yes (comment)   Criminal Activity/Legal Involvement Pertinent to Current Situation/Hospitalization: No - Comment as needed  Activities of Daily Living      Permission Sought/Granted                  Emotional Assessment Appearance:: Developmentally appropriate Attitude/Demeanor/Rapport: Engaged Affect (typically observed):  Calm Orientation: : Oriented to Self, Oriented to Place, Oriented to  Time, Oriented to Situation Alcohol  / Substance Use: Not Applicable Psych Involvement: No (comment)  Admission diagnosis:  Other ascites [R18.8] ESRD (end stage renal disease) on dialysis (HCC) [N18.6, Z99.2] DKA, type 1 (HCC) [E10.10] Dyspnea and respiratory abnormalities [R06.00, R06.89] Hypervolemia, unspecified hypervolemia type [E87.70] Type 1 diabetes mellitus with ketoacidosis without coma (HCC) [E10.10] Patient Active Problem List   Diagnosis Date Noted   Acute on chronic diastolic heart failure (HCC) 03/22/2024   CAP (community acquired pneumonia) 03/22/2024   Diabetic ketoacidosis associated with type 1 diabetes mellitus (HCC) 03/16/2024   Neck abscess 02/22/2024   Sore throat 02/22/2024   HFrEF (heart failure with reduced ejection fraction) (HCC)    Chest pain 02/03/2024   Odynophagia 01/31/2024   Esophageal dysphagia 01/31/2024   Erosive esophagitis 01/31/2024   Hematemesis 01/26/2024   High anion gap metabolic acidosis 01/16/2024   Allergic rhinitis 01/16/2024   History of anemia due to chronic kidney disease 01/16/2024   Hypervolemia associated with renal insufficiency 12/19/2023   Pain and swelling of right upper extremity 12/19/2023   History of seizure disorder 11/17/2023   Hyperosmolar hyperglycemic state (HHS) (HCC) 11/03/2023   Volume overload 11/03/2023   Vulvar pain 11/03/2023   Generalized abdominal pain 08/27/2023   Elevated LFTs  07/20/2023   Cholecystitis 06/30/2023   Prolonged QT interval 06/30/2023   Bipolar disorder (HCC) 06/30/2023   Hyperglycemic crisis due to Type 1 diabetes mellitus (HCC) 05/20/2023   Acute on chronic HFrEF (heart failure with reduced ejection fraction) (HCC) 05/20/2023   Chronic combined systolic and diastolic heart failure (HCC) 05/20/2023   Generalized pain 05/20/2023   Hypoglycemia 09/08/2022   Dilated cardiomyopathy (HCC) 09/08/2022   Seizures (HCC)  09/08/2022   ESRD on hemodialysis (HCC) 09/08/2022   Altered mental status 09/07/2022   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 difficile diarrhea 11/15/2021   SIRS (systemic inflammatory response syndrome) (HCC) 11/06/2021   LGSIL on Pap smear of cervix 09/24/2021   DKA (diabetic ketoacidosis) (HCC) 07/26/2021   Hypertension associated with diabetes (HCC) 07/26/2021   N&V (nausea and vomiting) 06/20/2021   Type 1 diabetes mellitus with chronic kidney disease on chronic dialysis (HCC) 05/26/2021   Dyslipidemia 05/26/2021   Anasarca 04/16/2021   Mild protein malnutrition 03/07/2021   DKA, type 1 (HCC) 12/08/2020   Diabetes mellitus type I (HCC) 02/08/2020   Leg edema, left 09/27/2019   Systolic ejection murmur 08/03/2019   Type 1 diabetes mellitus with hyperglycemia (HCC) 07/24/2019   Diabetic retinopathy (HCC) 07/02/2019   DM (diabetes mellitus), type 1 with renal complications (HCC) 05/21/2019   Diarrhea 05/31/2016   Diabetic neuropathy (HCC) 01/16/2016   Preop cardiovascular exam 08/24/2013   Pseudohyponatremia 08/04/2012   Hypokalemia 05/07/2012   Lactic acidosis 02/19/2012   Leukocytosis 02/19/2012   Normocytic anemia 02/19/2012   Hyperkalemia 02/19/2012   Hyperglycemia 11/06/2011   Stable proliferative diabetic retinopathy associated with type 1 diabetes mellitus (HCC) 10/13/1997   PCP:  Keven Crumbly Pap, MD Pharmacy:   [facility] [place] - [address] [place]) [place] [address] Phone: [phone] Fax: [phone]  [facility] [address] [place] [address] Phone: [phone] Fax: [phone]  [place] [facility] [address]  [address] [place] [address] Phone: [phone] Fax: [phone]     Social Drivers of Health (SDOH) Social History: SDOH Screenings   Food Insecurity: No Food Insecurity (04/07/2024)  Housing: Low Risk  (04/07/2024)  Transportation Needs: No Transportation Needs (04/07/2024)  Utilities: Not At Risk (04/07/2024)  Depression (PHQ2-9): Medium Risk (03/02/2020)  Financial Resource Strain: Medium Risk (11/15/2023)   Received from [facility]  Physical Activity: Insufficiently Active (03/02/2022)   Received from [facility]  Social Connections: Moderately Integrated (02/04/2024)  Stress: No Stress Concern Present (03/08/2023)   Received from [facility]  Tobacco Use: Low Risk  (04/06/2024)   SDOH Interventions:     Readmission Risk Interventions    03/23/2024    2:19 PM 02/06/2024    2:01 PM 11/28/2023   11:49 AM  Readmission Risk Prevention Plan  Transportation Screening Complete Complete Complete  Medication Review Oceanographer) Complete Referral to Pharmacy Complete  PCP or Specialist appointment within 3-5 days of discharge Complete Complete Complete  HRI or Home Care Consult Complete Complete Complete  SW Recovery Care/Counseling Consult  Complete   Palliative Care Screening Not Applicable Not Applicable Not Applicable  Skilled Nursing Facility Not Applicable Not Applicable Not Applicable

## 2024-04-07 NOTE — Progress Notes (Signed)
 Hypoglycemic Event  CBG: 35 @2355   Treatment: 8 oz juice/soda  Symptoms: Sweaty  Follow-up CBG: Time:0022 CBG Result:29  Possible Reasons for Event: Inadequate meal intake, did not eat dinner  Comments/MD notified:NA     Ashley Freeman [name] Mon

## 2024-04-07 NOTE — Progress Notes (Signed)
 [facility] Progress Note   Subjective:  Completed dialysis yesterday - net UF 5L  Also had paracentesis with 3.5L removed  CGBs variable. Hypoglycemia noted  Feels much better today. Denies cp, sob.   Objective Vitals:   04/06/24 2247 04/07/24 0332 04/07/24 0500 04/07/24 0725  BP: (!) 123/108   119/84  Pulse: (!) 102 69    Resp:      Temp: 98.4 F (36.9 C) (!) 97.4 F (36.3 C)  97.6 F (36.4 C)  TempSrc: Oral Oral  Oral  SpO2:      Weight:   59.2 kg   Height:        Additional Objective Labs: Basic Metabolic Panel: Recent Labs  Lab 04/06/24 0808 04/06/24 0824 04/06/24 0826 04/06/24 0935 04/07/24 0323  NA 134* 128* 130*  --  139  K 6.1* 6.2* 6.3*  --  3.9  CL 91*  --  98  --  97*  CO2 14*  --   --   --  23  GLUCOSE 791*  --  >700*  --  41*  BUN 42*  --  50*  --  24*  CREATININE 7.18*  --  6.80* 7.42* 5.20*  CALCIUM  9.2  --   --   --  8.4*   CBC: Recent Labs  Lab 04/06/24 0808 04/06/24 0824 04/06/24 0826  WBC 12.5*  --   --   NEUTROABS 9.1*  --   --   HGB 11.0* 12.9 12.9  HCT 36.0 38.0 38.0  MCV 87.2  --   --   PLT 293  --   --    Blood Culture    Component Value Date/Time   SDES PERITONEAL 04/06/2024 1122   SPECREQUEST ABDOMEN 04/06/2024 1122   CULT  04/06/2024 1122    NO GROWTH < 24 HOURS Performed at [facility], [address] [place], [place] [address]    REPTSTATUS PENDING 04/06/2024 1122    Physical Exam General: Alert, nad Heart: RRR Lungs: Clear, no rales Abdomen: soft, flat, non-tender Extremities: no LE edema  Dialysis Access: RU AVG   Medications:  cefTRIAXone  (ROCEPHIN )  IV      amLODipine   10 mg Oral Daily   bumetanide   10 mg Oral Daily   carvedilol   25 mg Oral BID WC   Chlorhexidine  Gluconate Cloth  6 each Topical Q0600   DULoxetine   20 mg Oral Daily   heparin   5,000 Units Subcutaneous Q8H   insulin  aspart  0-9 Units Subcutaneous TID WC   insulin  glargine  6 Units Subcutaneous Q2200    lamoTRIgine   200 mg Oral Daily   pantoprazole   40 mg Oral BID   patiromer   16.8 g Oral Daily   rosuvastatin   40 mg Oral Daily   sevelamer  carbonate  800 mg Oral TID WC   sodium bicarbonate   650 mg Oral BID   sodium chloride  flush  3 mL Intravenous Q12H    Dialysis Orders:  [facility] EDW ?61kg   Assessment/Plan: DKA. Uncontrolled T1DM. Noncompliant with insulin . Management per primary ESRD. HD TTS. Noncompliant with HD. Had urgent HD Monday. No urgent dialysis needs today. Next HD Wed.  Hyperkalemia. 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 Optimize volume with HD. Appears euvolemic today after HD/paracentesis  HTN. BP at goal. Continue home meds Anemia. Hgb 11-12. No ESA needs MBD. Ca acceptable. Check phos with HD Continue home binders. Medical noncompliance. Psych consulted to determine capacity.  Maisie Ronnald Acosta PA-C [facility] 04/07/2024,10:40 AM

## 2024-04-07 NOTE — Plan of Care (Signed)

## 2024-04-07 NOTE — Significant Event (Signed)
 Patient noted to have recurrent episodes of hypoglycemia last one ending.  Patient presented with DKA and did receive Lantus  10 units at around 6:30 PM last evening before discontinuing the insulin  infusion.  Since patient is having recurrent episodes of hypoglycemia and also has known history of brittle diabetes I discontinued insulin  for now.  Will check CBG q. hourly or more frequently and restart long-acting insulin  and sliding scale at a lower dose.  Will sign out to the oncoming hospitalist.  Discussed with patient's nurse.  Redia Cleaver

## 2024-04-07 NOTE — Progress Notes (Signed)
 Pt receives out-pt HD at [facility] on TTS 11:45 am chair time. Will assist as needed.   Randine Mungo Dialysis Navigator [phone]

## 2024-04-07 NOTE — Progress Notes (Signed)
 Hypoglycemic Event  CBG: 19  At 0555  Treatment: D50 50 mL (25 gm)  Symptoms: Sweaty  Follow-up CBG: [id number] CBG Result:146  Possible Reasons for Event: Inadequate meal intake and Other: HD patient   Comments/MD notified: Griffin Alberteen [name] Almarie

## 2024-04-07 NOTE — Plan of Care (Signed)
   Problem: Coping: Goal: Level of anxiety will decrease Outcome: Progressing   Problem: Elimination: Goal: Will not experience complications related to bowel motility Outcome: Progressing

## 2024-04-08 DIAGNOSIS — E1010 Type 1 diabetes mellitus with ketoacidosis without coma: Principal | ICD-10-CM

## 2024-04-08 LAB — CBC WITH DIFFERENTIAL/PLATELET
Abs Immature Granulocytes: 0.06 K/uL (ref 0.00–0.07)
Basophils Absolute: 0.1 K/uL (ref 0.0–0.1)
Basophils Relative: 0 %
Eosinophils Absolute: 0.4 K/uL (ref 0.0–0.5)
Eosinophils Relative: 3 %
HCT: 36.9 % (ref 36.0–46.0)
Hemoglobin: 11.5 g/dL — ABNORMAL LOW (ref 12.0–15.0)
Immature Granulocytes: 0 %
Lymphocytes Relative: 36 %
Lymphs Abs: 4.9 K/uL — ABNORMAL HIGH (ref 0.7–4.0)
MCH: 27.3 pg (ref 26.0–34.0)
MCHC: 31.2 g/dL (ref 30.0–36.0)
MCV: 87.4 fL (ref 80.0–100.0)
Monocytes Absolute: 0.7 K/uL (ref 0.1–1.0)
Monocytes Relative: 5 %
Neutro Abs: 7.4 K/uL (ref 1.7–7.7)
Neutrophils Relative %: 56 %
Platelets: 261 K/uL (ref 150–400)
RBC: 4.22 MIL/uL (ref 3.87–5.11)
RDW: 15 % (ref 11.5–15.5)
WBC: 13.4 K/uL — ABNORMAL HIGH (ref 4.0–10.5)
nRBC: 0.7 % — ABNORMAL HIGH (ref 0.0–0.2)

## 2024-04-08 LAB — GLUCOSE, CAPILLARY
Glucose-Capillary: 37 mg/dL — CL (ref 70–99)
Glucose-Capillary: 44 mg/dL — CL (ref 70–99)
Glucose-Capillary: 51 mg/dL — ABNORMAL LOW (ref 70–99)
Glucose-Capillary: 183 mg/dL — ABNORMAL HIGH (ref 70–99)
Glucose-Capillary: 209 mg/dL — ABNORMAL HIGH (ref 70–99)
Glucose-Capillary: 167 mg/dL — ABNORMAL HIGH (ref 70–99)

## 2024-04-08 LAB — RENAL FUNCTION PANEL
Albumin: 2.3 g/dL — ABNORMAL LOW (ref 3.5–5.0)
Anion gap: 12 (ref 5–15)
BUN: 29 mg/dL — ABNORMAL HIGH (ref 6–20)
CO2: 29 mmol/L (ref 22–32)
Calcium: 8.1 mg/dL — ABNORMAL LOW (ref 8.9–10.3)
Chloride: 98 mmol/L (ref 98–111)
Creatinine, Ser: 6.16 mg/dL — ABNORMAL HIGH (ref 0.44–1.00)
GFR, Estimated: 9 mL/min — ABNORMAL LOW (ref >60)
Glucose, Bld: 50 mg/dL — ABNORMAL LOW (ref 70–99)
Phosphorus: 7.6 mg/dL — ABNORMAL HIGH (ref 2.5–4.6)
Potassium: 3.8 mmol/L (ref 3.5–5.1)
Sodium: 139 mmol/L (ref 135–145)

## 2024-04-08 MED ORDER — INSULIN GLARGINE 100 UNIT/ML ~~LOC~~ SOLN
3.0000 [IU] | Freq: Every day | SUBCUTANEOUS | Status: DC
  Filled 2024-04-08: qty 0.03

## 2024-04-08 MED ORDER — LANTUS SOLOSTAR 100 UNIT/ML ~~LOC~~ SOPN: 3.0000 [IU] | PEN_INJECTOR | Freq: Every day | SUBCUTANEOUS | Status: DC

## 2024-04-08 NOTE — Inpatient Diabetes Management (Signed)
 Inpatient Diabetes Program Recommendations  AACE/ADA: New Consensus Statement on Inpatient Glycemic Control   Target Ranges:  Prepandial:   less than 140 mg/dL      Peak postprandial:   less than 180 mg/dL (1-2 hours)      Critically ill patients:  140 - 180 mg/dL   Lab Results  Component Value Date   GLUCAP 183 (H) 04/08/2024   HGBA1C 8.5 (H) 03/30/2024    Latest Reference Range & Units 04/07/24 20:33 04/07/24 21:56 04/08/24 06:32 04/08/24 07:02 04/08/24 07:28 04/08/24 08:48 04/08/24 09:06  Glucose-Capillary 70 - 99 mg/dL 843 (H) 865 (H) 51 (L) 44 (LL) 37 (LL) 209 (H) 183 (H)   Review of Glycemic Control  Diabetes history: DM1   Outpatient Diabetes medications:  Dexcom G7 CGM  Lantus  5 units daily  Novolog  2-8 units TID     Current orders for Inpatient glycemic control:  Lantus  6 units  Novolog  0-9 units TID    Inpatient Diabetes Program Recommendations:  Noted Lantus  6 units was administered last night and hypoglycemia this morning  Please consider decreasing Lantus  to 3 units daily OR discontinuing Lantus  for now and changing to Novolog  0-6 units Q4HR.   Thanks,  Lavanda Search, RN, MSN, [facility]  Inpatient Diabetes Coordinator  Pager [phone] (8a-5p)

## 2024-04-08 NOTE — Progress Notes (Signed)
 Late Note Entry- April 08, 2024 at 2:43 pm:  Pt left AMA earlier today. Contacted [facility] to be advised that pt left hospital AMA today. D/C summary and today's renal note faxed to clinic for continuation of care.   Randine Mungo Dialysis Navigator [phone]

## 2024-04-08 NOTE — Plan of Care (Signed)
  Problem: Education: Goal: Knowledge of General Education information will improve Description: Including pain rating scale, medication(s)/side effects and non-pharmacologic comfort measures Outcome: Completed/Met   Problem: Health Behavior/Discharge Planning: Goal: Ability to manage health-related needs will improve Outcome: Completed/Met   Problem: Clinical Measurements: Goal: Ability to maintain clinical measurements within normal limits will improve Outcome: Completed/Met Goal: Will remain free from infection Outcome: Completed/Met Goal: Diagnostic test results will improve Outcome: Completed/Met Goal: Respiratory complications will improve Outcome: Completed/Met Goal: Cardiovascular complication will be avoided Outcome: Completed/Met   Problem: Activity: Goal: Risk for activity intolerance will decrease Outcome: Completed/Met   Problem: Nutrition: Goal: Adequate nutrition will be maintained Outcome: Completed/Met   Problem: Coping: Goal: Level of anxiety will decrease Outcome: Completed/Met   Problem: Elimination: Goal: Will not experience complications related to bowel motility Outcome: Completed/Met Goal: Will not experience complications related to urinary retention Outcome: Completed/Met   Problem: Pain Managment: Goal: General experience of comfort will improve and/or be controlled Outcome: Completed/Met   Problem: Safety: Goal: Ability to remain free from injury will improve Outcome: Completed/Met   Problem: Skin Integrity: Goal: Risk for impaired skin integrity will decrease Outcome: Completed/Met   Problem: Education: Goal: Ability to describe self-care measures that may prevent or decrease complications (Diabetes Survival Skills Education) will improve Outcome: Completed/Met Goal: Individualized Educational Video(s) Outcome: Completed/Met   Problem: Coping: Goal: Ability to adjust to condition or change in health will improve Outcome:  Completed/Met   Problem: Fluid Volume: Goal: Ability to maintain a balanced intake and output will improve Outcome: Completed/Met   Problem: Health Behavior/Discharge Planning: Goal: Ability to identify and utilize available resources and services will improve Outcome: Completed/Met Goal: Ability to manage health-related needs will improve Outcome: Completed/Met   Problem: Metabolic: Goal: Ability to maintain appropriate glucose levels will improve Outcome: Completed/Met   Problem: Nutritional: Goal: Maintenance of adequate nutrition will improve Outcome: Completed/Met Goal: Progress toward achieving an optimal weight will improve Outcome: Completed/Met   Problem: Skin Integrity: Goal: Risk for impaired skin integrity will decrease Outcome: Completed/Met   Problem: Tissue Perfusion: Goal: Adequacy of tissue perfusion will improve Outcome: Completed/Met

## 2024-04-08 NOTE — Plan of Care (Signed)
  Problem: Clinical Measurements: Goal: Will remain free from infection Outcome: Progressing Goal: Respiratory complications will improve Outcome: Progressing Goal: Cardiovascular complication will be avoided Outcome: Progressing   

## 2024-04-08 NOTE — Progress Notes (Signed)
 [facility] Progress Note   Subjective:  Seen in room. No complaints this am. She does want to go home today, says she needs to pick up her daughter this afternoon.  Plan for dialysis today, but not sure she will stay for treatment   Objective Vitals:   04/07/24 1944 04/08/24 0007 04/08/24 0430 04/08/24 0847  BP: (!) 98/59 105/65 (!) 88/60   Pulse: 77 76 80 82  Resp: 20 18    Temp: 97.7 F (36.5 C) 97.7 F (36.5 C) 97.6 F (36.4 C) 97.8 F (36.6 C)  TempSrc: Oral Oral Oral Oral  SpO2:      Weight:      Height:        Additional Objective Labs: Basic Metabolic Panel: Recent Labs  Lab 04/06/24 0808 04/06/24 0824 04/06/24 0826 04/06/24 0935 04/07/24 0323 04/08/24 0322  NA 134*   < > 130*  --  139 139  K 6.1*   < > 6.3*  --  3.9 3.8  CL 91*  --  98  --  97* 98  CO2 14*  --   --   --  23 29  GLUCOSE 791*  --  >700*  --  41* 50*  BUN 42*  --  50*  --  24* 29*  CREATININE 7.18*  --  6.80* 7.42* 5.20* 6.16*  CALCIUM  9.2  --   --   --  8.4* 8.1*  PHOS  --   --   --   --   --  7.6*   < > = values in this interval not displayed.   CBC: Recent Labs  Lab 04/06/24 0808 04/06/24 0824 04/06/24 0826 04/08/24 0322  WBC 12.5*  --   --  13.4*  NEUTROABS 9.1*  --   --  7.4  HGB 11.0* 12.9 12.9 11.5*  HCT 36.0 38.0 38.0 36.9  MCV 87.2  --   --  87.4  PLT 293  --   --  261   Blood Culture    Component Value Date/Time   SDES PERITONEAL 04/06/2024 1122   SPECREQUEST ABDOMEN 04/06/2024 1122   CULT  04/06/2024 1122    NO GROWTH 2 DAYS Performed at [facility], [address] [place], [place] [address]    REPTSTATUS PENDING 04/06/2024 1122    Physical Exam General: Alert, nad Heart: RRR Lungs: Clear, no rales Abdomen: soft, flat, non-tender Extremities: no LE edema  Dialysis Access: RU AVG   Medications:  cefTRIAXone  (ROCEPHIN )  IV 2 g (04/07/24 1152)    amLODipine   10 mg Oral Daily   bumetanide   10 mg Oral Daily   carvedilol   25 mg  Oral BID WC   Chlorhexidine  Gluconate Cloth  6 each Topical Q0600   DULoxetine   20 mg Oral Daily   heparin   5,000 Units Subcutaneous Q8H   insulin  aspart  0-9 Units Subcutaneous TID WC   insulin  glargine  3 Units Subcutaneous Q2200   lamoTRIgine   200 mg Oral Daily   pantoprazole   40 mg Oral BID   patiromer   16.8 g Oral Daily   rosuvastatin   40 mg Oral Daily   sevelamer  carbonate  800 mg Oral TID WC   sodium bicarbonate   650 mg Oral BID   sodium chloride  flush  3 mL Intravenous Q12H    Dialysis Orders:  [facility] EDW ?61kg   Assessment/Plan: DKA. Uncontrolled T1DM. Noncompliant with insulin . Management per primary ESRD. HD TTS. Noncompliant with HD. Had  urgent HD Monday. Plan for HD today or tomorrow depending on dispo.  Hyperkalemia. 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 Optimize volume with HD. Appears euvolemic today after HD/paracentesis  HTN. BP at goal. Continue home meds Anemia. Hgb 11-12. No ESA needs MBD. Ca acceptable. Phos above goal.  Continue home binders. Medical noncompliance. Psych consulted to determine capacity.   Maisie Ronnald Acosta PA-C [facility] 04/08/2024,11:18 AM

## 2024-04-08 NOTE — Discharge Summary (Signed)
 Physician Discharge Summary  Ashley Freeman FMW:[id number] DOB: 27-Oct-1990 DOA: 04/06/2024  PCP: Keven Crumbly Pap, MD  Admit date: 04/06/2024 Discharge date: 04/08/2024                                 This is AMA discharge summary    Brief/Interim Summary: 33 y.o. 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  Patient was recently discharged on 12/1 after an admission for positive SIRS physiology without infectious process and hypoglycemia.  At that presentation her CBG was 35 in the field and it was documented she had also not been checking her CBGs for multiple months and had been taking her insulin  once every 3 to 4 days and not eating adequately.  She underwent paracentesis for her ascites and 6 L was removed.  Her SAAG was consistent with hepatic etiology to her ascites and GI evaluation in the outpatient setting is pending.  Note patient is highly noncompliant with her insulin  and dialysis regimen on a persistent basis requiring multiple hospital admissions.  Continues to be noncompliant with both.   She presented via EMS back to the ED with reports of abdominal distention, abdominal and chest pain after missing dialysis on Saturday, she was found to have significant ascites and elevated blood sugars and was admitted to the hospital.  She went ultrasound-guided paracentesis, while she was in DKA, treated with DKA protocol, transition to subcu insulin , she has very labile mellitus, with extreme fluctuation of her blood glucose, as well she was seen by nephrologist who, where she had urgent dialysis on Monday, and plan was for another HD today, but patient was adamant about leaving today, and she left AMA   Recurrent ascites with mild transaminitis.  Negative acute hepatitis  panel recently, on imaging no signs of cirrhosis, underwent ultrasound-guided paracentesis on 04/06/2024 with 3.5 L of fluid removed, SAAG levels not suggestive of portal hypertension, case discussed in detail with gastroenterologist Dr. Leigh, recurrent ascites is due to third spacing of fluid from missed dialysis sessions and fluid overload, no signs of SBP, patient has been counseled to be compliant with her medications and HD treatments, monitor with supportive care.  Fluid removal via HD and resume Bumex  at home dose.     2.  DKA in a patient with DM type I who is noncompliant with her insulin  regimen on a persistent basis with very brittle diabetes - Was treated with DKA protocol, transition to subcu insulin  but CBGs running low overnight, so her long-acting insulin  was decreased to 3 units, and kept on insulin  sliding scale.      3. ESRD  - Extreme noncompliance with dialysis sessions with fluid overload, he received urgent HD on Monday, leaving AMA despite recommending to finish her HD today, but she is adamant about leaving now and did not want to wait for HD.      4.  Acute on chronic chronic HFrEF  - EF was 30-35% in May 2025   5. Bipolar disorder  - Continue Atarax , Lamictal , psychiatry consulted regarding medication adjustment if needed given her extreme noncompliance, but patient is leaving AMA, she is awake, alert, oriented x 4, cannot be kept against her well pending psychiatry evaluation.    6. HTN    7.  Dyslipidemia.  -on statin     Discharge Diagnoses:  Active Problems:   DKA, type 1 (HCC)    Discharge Instructions   Allergies as of 04/08/2024       Reactions   Keflex  [cephalexin ] Anaphylaxis   Ceftriaxone  in the past with no reaction   Penicillins Anaphylaxis, Hives, Rash   Vibramycin  [doxycycline ] Anaphylaxis   Benadryl  [diphenhydramine ] Itching   Dilaudid  [hydromorphone ] Itching   Methotrexate And Trimetrexate Rash   Roxicodone  [oxycodone ] Itching    Takes Percocet without issue        Medication List     TAKE these medications    albuterol  108 (90 Base) MCG/ACT inhaler Commonly known as: VENTOLIN  HFA Inhale 2 puffs into the lungs every 4 (four) hours as needed for wheezing or shortness of breath.   bumetanide  2 MG tablet Commonly known as: BUMEX  Take 10 mg by mouth daily.   carvedilol  25 MG tablet Commonly known as: COREG  Take 25 mg by mouth 2 (two) times daily with a meal.   Cinnamon 500 MG capsule Take 500 mg by mouth in the morning.   DULoxetine  20 MG capsule Commonly known as: CYMBALTA  Take 20 mg by mouth daily.   ELDERBERRY IMMUNE HEALTH GUMMY PO Take 2 each by mouth in the morning.   Entresto  24-26 MG Generic drug: sacubitril -valsartan  Take 1 tablet by mouth 2 (two) times daily.   fluticasone  50 MCG/ACT nasal spray Commonly known as: FLONASE  Place 2 sprays into both nostrils daily as needed for allergies or rhinitis.   hydrOXYzine  25 MG tablet Commonly known as: ATARAX  Take 25 mg by mouth 3 (three) times daily as needed for anxiety, itching, nausea or vomiting.   lamoTRIgine  200 MG tablet Commonly known as: LAMICTAL  Take 1 tablet (200 mg total) by mouth daily.   Lantus  SoloStar 100 UNIT/ML Solostar Pen Generic drug: insulin  glargine Inject 3 Units into the skin daily. What changed: how much to take   NovoLOG  FlexPen 100 UNIT/ML FlexPen Generic drug: insulin  aspart Before each meal 3 times a day, 140-199 - 2 units, 200-250 - 4 units, 251-299 - 6 units,  300-349 - 7 units,  350 or above 8 units.   OLANZapine  5 MG tablet Commonly known as: ZYPREXA  Take 5 mg by mouth at bedtime.   Pancrelipase  (Lip-Prot-Amyl) 3000-9500 units Cpep Take 3,000 Units by mouth in the morning, at noon, and at bedtime.   pantoprazole  40 MG tablet Commonly known as: PROTONIX  Take 1 tablet (40 mg total) by mouth 2 (two) times daily.   prochlorperazine  5 MG tablet Commonly known as: COMPAZINE  Take 5 mg by mouth  every 6 (six) hours as needed for nausea or vomiting.   rosuvastatin  40 MG tablet Commonly known as: CRESTOR  Take 40 mg by mouth daily.   sevelamer  carbonate 800 MG tablet Commonly known as: RENVELA  Take 1 tablet (800 mg total) by mouth 3 (three) times daily with meals.   sodium bicarbonate  650 MG tablet Take 650 mg by mouth 2 (two) times daily.   SUMAtriptan  50 MG tablet Commonly known as: IMITREX  Take 50 mg by mouth every 2 (two) hours as needed for migraine or headache.   Veltassa  16.8 g Pack Generic drug: Patiromer  Sorbitex Calcium  Take 1 Dose by mouth daily at 12 noon.        Allergies  Allergen Reactions   Keflex  [Cephalexin ] Anaphylaxis    Ceftriaxone  in the past with no reaction   Penicillins Anaphylaxis, Hives and Rash   Vibramycin  [Doxycycline ] Anaphylaxis  Benadryl  [Diphenhydramine ] Itching   Dilaudid  [Hydromorphone ] Itching   Methotrexate And Trimetrexate Rash   Roxicodone  [Oxycodone ] Itching    Takes Percocet without issue    Consultations: Renal    Procedures/Studies: IR Paracentesis Result Date: 04/06/2024 INDICATION: 33 year old female with history of ESRD, on HD, with recurrent ascites. IR is requested for diagnostic and therapeutic paracentesis. EXAM: ULTRASOUND GUIDED DIAGNOSTIC AND THERAPEUTIC PARACENTESIS MEDICATIONS: 5 cc of 1% lidocaine . COMPLICATIONS: None immediate. PROCEDURE: Informed written consent was obtained from the patient after a discussion of the risks, benefits and alternatives to treatment. A timeout was performed prior to the initiation of the procedure. Initial ultrasound scanning demonstrates a large amount of ascites within the right lower abdominal quadrant. The right lower abdomen was prepped and draped in the usual sterile fashion. 1% lidocaine  was used for local anesthesia. Following this, a 19 gauge, 7-cm, Yueh catheter was introduced. An ultrasound image was saved for documentation purposes. The paracentesis was performed.  The catheter was removed and a dressing was applied. The patient tolerated the procedure well without immediate post procedural complication. FINDINGS: A total of approximately 3.5 L of clear, straw-colored fluid was removed. Samples were sent to the laboratory as requested by the clinical team. IMPRESSION: Successful ultrasound-guided paracentesis yielding 3.5 liters of peritoneal fluid. Performed by: Carlin Griffon, PA-C Electronically Signed   By: Wilkie Lent M.D.   On: 04/06/2024 14:59   DG Chest Port 1 View Result Date: 04/06/2024 CLINICAL DATA:  Sepsis. EXAM: PORTABLE CHEST 1 VIEW COMPARISON:  03/28/2024 FINDINGS: The cardio pericardial silhouette is enlarged. Focal consolidative opacity at the right base with pleural effusion. Low lung volumes with vascular congestion. No acute bony abnormality. Telemetry leads overlie the chest. IMPRESSION: Focal consolidative opacity at the right base with pleural effusion. Imaging features compatible with pneumonia. Follow-up imaging recommended to ensure resolution. Low lung volumes with cardiomegaly. Electronically Signed   By: Camellia Candle M.D.   On: 04/06/2024 10:23   DG Chest Portable 1 View Result Date: 03/28/2024 CLINICAL DATA:  History of CHF.  Hypoglycemia. EXAM: PORTABLE CHEST 1 VIEW COMPARISON:  Chest x-ray 03/24/2024 FINDINGS: The heart is enlarged. Lung volumes are low. There is no focal lung infiltrate, pleural effusion or pneumothorax. No acute fractures are seen. IMPRESSION: Cardiomegaly. No acute cardiopulmonary process. Electronically Signed   By: Greig Pique M.D.   On: 03/28/2024 22:31   DG Chest Port 1 View Result Date: 03/24/2024 EXAM: 1 VIEW(S) XRAY OF THE CHEST 03/24/2024 06:43:00 PM COMPARISON: 03/23/2024 CLINICAL HISTORY: Shortness of breath FINDINGS: LUNGS AND PLEURA: Improved diffuse airspace opacities. Increased retrocardiac opacity. Small pleural effusions. No pneumothorax. HEART AND MEDIASTINUM: Similar cardiomegaly.  Partially visualized right arm vascular stent. BONES AND SOFT TISSUES: No acute osseous abnormality. IMPRESSION: 1. Improved increased aeration compared to prior radiograph. 2. Increased retrocardiac opacity, atelectasis versus pneumonia . 3. Stable  cardiomegaly with Small pleural effusions. Electronically signed by: Luke Bun MD 03/24/2024 07:08 PM EST RP Workstation: [id number]   IR Paracentesis Result Date: 03/23/2024 INDICATION: History of end-stage renal disease on hemodialysis with recurrent ascites. Request for therapeutic paracentesis. EXAM: ULTRASOUND GUIDED THERAPEUTIC PARACENTESIS MEDICATIONS: 8 mL 1% lidocaine  COMPLICATIONS: None immediate. PROCEDURE: Informed written consent was obtained from the patient after a discussion of the risks, benefits and alternatives to treatment. A timeout was performed prior to the initiation of the procedure. Initial ultrasound scanning demonstrates a moderate amount of ascites within the right lower abdominal quadrant. The right lower abdomen was prepped and draped in the usual sterile  fashion. 1% lidocaine  was used for local anesthesia. Following this, a 19 gauge, 7-cm, Yueh catheter was introduced. An ultrasound image was saved for documentation purposes. The paracentesis was performed. The catheter was removed and a dressing was applied. The patient tolerated the procedure well without immediate post procedural complication. Patient received post-procedure intravenous albumin ; see nursing notes for details. FINDINGS: A total of approximately 6.2 liters of hazy yellow fluid was removed. IMPRESSION: Successful ultrasound-guided paracentesis yielding 6.2 liters of peritoneal fluid. Performed by: Wyatt Pommier, PA-C Electronically Signed   By: JONETTA Faes M.D.   On: 03/23/2024 16:25   DG Chest Port 1 View Result Date: 03/23/2024 EXAM: 1 VIEW(S) XRAY OF THE CHEST 03/23/2024 12:30:00 AM COMPARISON: 03/21/2024. CLINICAL HISTORY: SOB (shortness of breath).  FINDINGS: LUNGS AND PLEURA: Worsening bilaterally airspace disease could reflect edema or infection. Suspect small right pleural effusion. Low lung volumes. No pneumothorax. HEART AND MEDIASTINUM: Cardiomegaly. BONES AND SOFT TISSUES: No acute osseous abnormality. IMPRESSION: 1. Worsening bilateral airspace disease, possibly reflecting edema or infection. 2. Suspected small right pleural effusion. 3. Cardiomegaly. Electronically signed by: Franky Crease MD 03/23/2024 12:42 AM EST RP Workstation: [id number]   CT ABDOMEN PELVIS WO CONTRAST Result Date: 03/22/2024 EXAM: CT ABDOMEN AND PELVIS WITHOUT CONTRAST 03/22/2024 01:57:31 AM TECHNIQUE: CT of the abdomen and pelvis was performed without the administration of intravenous contrast. Multiplanar reformatted images are provided for review. Automated exposure control, iterative reconstruction, and/or weight-based adjustment of the mA/kV was utilized to reduce the radiation dose to as low as reasonably achievable. COMPARISON: 03/08/2024 CLINICAL HISTORY: Distended abdomen. FINDINGS: LOWER CHEST: Moderate right-sided pleural effusion is noted, slightly increased when compared with the prior exam. LIVER: The liver is within normal limits. GALLBLADDER AND BILE DUCTS: The gallbladder has been surgically removed. No biliary ductal dilatation. SPLEEN: The spleen is unremarkable. PANCREAS: The pancreas is unremarkable. ADRENAL GLANDS: The adrenal glands are well visualized without acute abnormality. KIDNEYS, URETERS AND BLADDER: The kidneys are well visualized without acute abnormality. Renal vascular calcifications are seen. The bladder is partially distended. No stones in the kidneys or ureters. GI AND BOWEL: Stomach and small bowel are within normal limits. No obstructive or inflammatory changes of the colon are seen. The appendix is not well visualized and may have been surgically removed. No inflammatory changes to suggest appendicitis are seen. PERITONEUM AND  RETROPERITONEUM: Moderate to severe ascites is noted, increased when compared with the prior exam. No free air. VASCULATURE: Diffuse vascular calcifications are noted in the visceral vessels. The aorta shows no aneurysmal dilatation. LYMPH NODES: No lymphadenopathy. REPRODUCTIVE ORGANS: The uterus and adnexa are within normal limits. BONES AND SOFT TISSUES: Changes of anasarca are noted in the subcutaneous tissues. IMPRESSION: 1. Moderate to severe ascites, increased compared to the prior exam. 2. Moderate right pleural effusion, slightly increased compared to the prior exam. Electronically signed by: Oneil Devonshire MD 03/22/2024 02:03 AM EST RP Workstation: [id number]   DG Chest Portable 1 View Result Date: 03/21/2024 EXAM: 1 VIEW(S) XRAY OF THE CHEST 03/21/2024 07:26:00 PM COMPARISON: 03/16/2024 CLINICAL HISTORY: Shortness of breath FINDINGS: LUNGS AND PLEURA: Low lung volumes. Airspace disease in the right lower lobe concerning for pneumonia. No pleural effusion. No pneumothorax. HEART AND MEDIASTINUM: Cardiomegaly. BONES AND SOFT TISSUES: No acute osseous abnormality. IMPRESSION: 1. Airspace disease in the right lower lobe concerning for pneumonia. 2. Cardiomegaly. Electronically signed by: Franky Crease MD 03/21/2024 07:33 PM EST RP Workstation: [id number]   DG Chest Portable 1 View Result Date: 03/16/2024 CLINICAL DATA:  Chest pain, hyperglycemia. EXAM: PORTABLE CHEST 1 VIEW COMPARISON:  03/08/2024 and CT chest 07/15/2023. FINDINGS: Trachea is midline. Heart is enlarged. Lungs are low in volume with mild interstitial prominence and indistinctness. No pleural fluid. A vascular stent is partially imaged in the proximal right upper arm. IMPRESSION: Mild pulmonary edema. Electronically Signed   By: Newell Eke M.D.   On: 03/16/2024 17:44   (Echo, Carotid, EGD, Colonoscopy, ERCP)    Subjective: She had low CBG this morning, received D50, but she was not symptomatic during that event, she denies any  complaints, asking to go home, have told her we will await for dialysis first to be done to be safe for discharge, but she declines, she is awake, alert, coherent oriented x 4  Discharge Exam: Vitals:   04/08/24 0430 04/08/24 0847  BP: (!) 88/60   Pulse: 80 82  Resp:    Temp: 97.6 F (36.4 C) 97.8 F (36.6 C)  SpO2:     Vitals:   04/07/24 1944 04/08/24 0007 04/08/24 0430 04/08/24 0847  BP: (!) 98/59 105/65 (!) 88/60   Pulse: 77 76 80 82  Resp: 20 18    Temp: 97.7 F (36.5 C) 97.7 F (36.5 C) 97.6 F (36.4 C) 97.8 F (36.6 C)  TempSrc: Oral Oral Oral Oral  SpO2:      Weight:      Height:        General: Pt is alert, awake, not in acute distress Cardiovascular: RRR, S1/S2 +, no rubs, no gallops Respiratory: CTA bilaterally, no wheezing, no rhonchi Abdominal: Soft, NT, ND, bowel sounds + Extremities: no edema, no cyanosis    The results of significant diagnostics from this hospitalization (including imaging, microbiology, ancillary and laboratory) are listed below for reference.     Microbiology: Recent Results (from the past 240 hours)  Blood Culture (routine single)     Status: None (Preliminary result)   Collection Time: 04/06/24  8:13 AM   Specimen: BLOOD LEFT ARM  Result Value Ref Range Status   Specimen Description BLOOD LEFT ARM  Final   Special Requests   Final    BOTTLES DRAWN AEROBIC AND ANAEROBIC Blood Culture adequate volume   Culture   Final    NO GROWTH 2 DAYS Performed at [facility], [address] [place], [place] [address]    Report Status PENDING  Incomplete  Body fluid culture w Gram Stain     Status: None (Preliminary result)   Collection Time: 04/06/24 11:22 AM   Specimen: Abdomen; Peritoneal Fluid  Result Value Ref Range Status   Specimen Description PERITONEAL  Final   Special Requests ABDOMEN  Final   Gram Stain NO WBC SEEN NO ORGANISMS SEEN   Final   Culture   Final    NO GROWTH 2 DAYS Performed at [facility]  Lab, [address] [place], [place] [address]    Report Status PENDING  Incomplete     Labs: BNP (last 3 results) Recent Labs    01/04/24 0424 01/16/24 0608 03/21/24 2153  BNP >4,500.0* 4,044.0* >4,500.0*   Basic Metabolic Panel: Recent Labs  Lab 04/06/24 0808 04/06/24 0824 04/06/24 0826 04/06/24 0935 04/07/24 0323 04/08/24 0322  NA 134* 128* 130*  --  139 139  K 6.1* 6.2* 6.3*  --  3.9 3.8  CL 91*  --  98  --  97* 98  CO2 14*  --   --   --  23 29  GLUCOSE  791*  --  >700*  --  41* 50*  BUN 42*  --  50*  --  24* 29*  CREATININE 7.18*  --  6.80* 7.42* 5.20* 6.16*  CALCIUM  9.2  --   --   --  8.4* 8.1*  PHOS  --   --   --   --   --  7.6*   Liver Function Tests: Recent Labs  Lab 04/06/24 0808 04/07/24 0323 04/08/24 0322  AST 28 18  --   ALT 28 20  --   ALKPHOS 472* 370*  --   BILITOT 2.4* 1.1  --   PROT 5.5* 4.7*  --   ALBUMIN  3.1* 2.4* 2.3*   No results for input(s): LIPASE, AMYLASE in the last 168 hours. No results for input(s): AMMONIA in the last 168 hours. CBC: Recent Labs  Lab 04/06/24 0808 04/06/24 0824 04/06/24 0826 04/08/24 0322  WBC 12.5*  --   --  13.4*  NEUTROABS 9.1*  --   --  7.4  HGB 11.0* 12.9 12.9 11.5*  HCT 36.0 38.0 38.0 36.9  MCV 87.2  --   --  87.4  PLT 293  --   --  261   Cardiac Enzymes: No results for input(s): CKTOTAL, CKMB, CKMBINDEX, TROPONINI in the last 168 hours. BNP: Invalid input(s): POCBNP CBG: Recent Labs  Lab 04/08/24 0632 04/08/24 0702 04/08/24 0728 04/08/24 0848 04/08/24 0906  GLUCAP 51* 44* 37* 209* 183*   D-Dimer No results for input(s): DDIMER in the last 72 hours. Hgb A1c No results for input(s): HGBA1C in the last 72 hours. Lipid Profile No results for input(s): CHOL, HDL, LDLCALC, TRIG, CHOLHDL, LDLDIRECT in the last 72 hours. Thyroid  function studies No results for input(s): TSH, T4TOTAL, T3FREE, THYROIDAB in the last 72 hours.  Invalid input(s):  FREET3 Anemia work up No results for input(s): VITAMINB12, FOLATE, FERRITIN, TIBC, IRON , RETICCTPCT in the last 72 hours. Urinalysis    Component Value Date/Time   COLORURINE YELLOW 10/25/2023 2300   APPEARANCEUR CLOUDY (A) 10/25/2023 2300   APPEARANCEUR Hazy 11/10/2013 2043   LABSPEC 1.016 10/25/2023 2300   LABSPEC 1.031 11/10/2013 2043   PHURINE 6.0 10/25/2023 2300   GLUCOSEU >=500 (A) 10/25/2023 2300   GLUCOSEU >=500 11/10/2013 2043   HGBUR SMALL (A) 10/25/2023 2300   BILIRUBINUR NEGATIVE 10/25/2023 2300   BILIRUBINUR negative 06/04/2018 1035   BILIRUBINUR Negative 11/24/2015 1443   BILIRUBINUR Negative 11/10/2013 2043   KETONESUR 5 (A) 10/25/2023 2300   PROTEINUR >=300 (A) 10/25/2023 2300   UROBILINOGEN 0.2 09/14/2019 1732   NITRITE NEGATIVE 10/25/2023 2300   LEUKOCYTESUR NEGATIVE 10/25/2023 2300   LEUKOCYTESUR 1+ 11/10/2013 2043   Sepsis Labs Recent Labs  Lab 04/06/24 0808 04/08/24 0322  WBC 12.5* 13.4*   Microbiology Recent Results (from the past 240 hours)  Blood Culture (routine single)     Status: None (Preliminary result)   Collection Time: 04/06/24  8:13 AM   Specimen: BLOOD LEFT ARM  Result Value Ref Range Status   Specimen Description BLOOD LEFT ARM  Final   Special Requests   Final    BOTTLES DRAWN AEROBIC AND ANAEROBIC Blood Culture adequate volume   Culture   Final    NO GROWTH 2 DAYS Performed at [facility], [address] [place], [place] [address]    Report Status PENDING  Incomplete  Body fluid culture w Gram Stain     Status: None (Preliminary result)   Collection Time: 04/06/24 11:22 AM  Specimen: Abdomen; Peritoneal Fluid  Result Value Ref Range Status   Specimen Description PERITONEAL  Final   Special Requests ABDOMEN  Final   Gram Stain NO WBC SEEN NO ORGANISMS SEEN   Final   Culture   Final    NO GROWTH 2 DAYS Performed at [facility], [address] [place], [place] [address]    Report Status  PENDING  Incomplete     Time coordinating discharge: Over 30 minutes  SIGNED:   Brayton Lye, MD  [facility] 04/08/2024, 11:15 AM Pager   If 7PM-7AM, please contact night-coverage [email or web address]

## 2024-04-08 NOTE — TOC Transition Note (Signed)
 Transition of Care [facility]) - Discharge Note   Patient Details  Name: Ashley Freeman MRN: [id number] Date of Birth: [birth year]  [facility] CM/SW Contact:  Landry DELENA Senters, RN Phone Number: 04/08/2024, 12:52 PM   Clinical Narrative:     Patient left AMA today, without receiving dialysis or psych eval.    Barriers to Discharge: Continued Medical Work up   Patient Goals and CMS Choice            Discharge Placement                       Discharge Plan and Services Additional resources added to the After Visit Summary for                                       Social Drivers of Health (SDOH) Interventions SDOH Screenings   Food Insecurity: No Food Insecurity (04/07/2024)  Housing: Low Risk  (04/07/2024)  Transportation Needs: No Transportation Needs (04/07/2024)  Utilities: Not At Risk (04/07/2024)  Depression (PHQ2-9): Medium Risk (03/02/2020)  Financial Resource Strain: Medium Risk (11/15/2023)   Received from [facility]  Physical Activity: Insufficiently Active (03/02/2022)   Received from [facility]  Social Connections: Moderately Integrated (02/04/2024)  Stress: No Stress Concern Present (03/08/2023)   Received from [facility]  Tobacco Use: Low Risk  (04/06/2024)     Readmission Risk Interventions    03/23/2024    2:19 PM 02/06/2024    2:01 PM 11/28/2023   11:49 AM  Readmission Risk Prevention Plan  Transportation Screening Complete Complete Complete  Medication Review Oceanographer) Complete Referral to Pharmacy Complete  PCP or Specialist appointment within 3-5 days of discharge Complete Complete Complete  HRI or Home Care Consult Complete Complete Complete  SW Recovery Care/Counseling Consult  Complete   Palliative Care Screening Not Applicable Not Applicable Not Applicable  Skilled Nursing Facility Not Applicable Not Applicable Not Applicable

## 2024-04-08 NOTE — Consult Note (Signed)
°  Psychiatry Consult Note: Psychiatry was consulted to evaluate for possible underlying mental health concerns and to assess the patients decision-making as it pertains to adherence with dialysis and insulin  therapy. The patient is a middle-aged female with a history of Type I diabetes mellitus and end-stage renal disease on hemodialysis who has been intermittently skipping dialysis sessions and insulin  doses. The primary team expressed concern that her behavior may be influenced by a possible mood disorder such as bipolar disorder or depression.  The patient was seen and assessed at bedside. She was alert, oriented, pleasant, and jovial, interacting appropriately and appearing in no acute distress. Her affect was bright and congruent with her euthymic mood. She was smiling, laughing, and engaging easily in conversation. No psychomotor agitation, flight of ideas, or pressured speech was noted. She denied depressive symptoms, anhedonia, or suicidal ideation. There were no signs or symptoms of mania or psychosis observed during evaluation.  When asked about her dialysis and insulin  adherence, the patient provided coherent and rational explanations for her behavior. She stated, I missed a day of dialysis because I ate too much celery, and that made me swell up. She verbalized clear understanding of the consequences of skipping treatment, explaining that when she misses dialysis she experiences swelling, shortness of breath, and chest discomfort, and that missing insulin  makes her feel bad. She further elaborated that when she is physically ill, she sometimes skips her insulin  because she doesnt want to make herself feel worse, but otherwise takes it as prescribed.  The patient appears to have adequate understanding of her medical conditions, the purpose of her treatments, and the consequences of nonadherence. She demonstrates the ability to express a clear and consistent voluntary choice, understands the  nature of her illness, and can reason through her decisions logically. There is no evidence of an acute psychiatric disorder impairing judgment or decision-making at this time. While she makes poor health choices intermittently, these decisions appear to be based on personal preference and situational reasoning rather than psychiatric instability or lack of capacity.  Conclusion: At this time, there is no indication of mania, psychosis, or major depression contributing to the patients behavior. She demonstrates capacity to make informed medical decisions, including decisions regarding her dialysis and insulin  use, though her choices may not align with optimal medical recommendations. Her nonadherence appears behavioral rather than psychiatric. Recommend continued education, motivational support, and reinforcement of medical risks and benefits by the primary team. Psychiatry will sign off at this time. Please reconsult if any acute behavioral or mood changes arise.

## 2024-04-09 LAB — BODY FLUID CULTURE W GRAM STAIN
Culture: NO GROWTH
Gram Stain: NONE SEEN

## 2024-04-11 LAB — CULTURE, BLOOD (SINGLE)
Culture: NO GROWTH
Special Requests: ADEQUATE

## 2024-04-13 ENCOUNTER — Inpatient Hospital Stay (HOSPITAL_COMMUNITY)
Admission: EM | Admit: 2024-04-13 | Discharge: 2024-04-19 | DRG: 100 | Disposition: A | Discharge status: Home / Self Care | Attending: Family Medicine | Admitting: Family Medicine

## 2024-04-13 ENCOUNTER — Emergency Department (HOSPITAL_COMMUNITY)

## 2024-04-13 DIAGNOSIS — D631 Anemia in chronic kidney disease: Secondary | ICD-10-CM | POA: Diagnosis present

## 2024-04-13 DIAGNOSIS — E785 Hyperlipidemia, unspecified: Secondary | ICD-10-CM | POA: Diagnosis present

## 2024-04-13 DIAGNOSIS — E1010 Type 1 diabetes mellitus with ketoacidosis without coma: Secondary | ICD-10-CM | POA: Diagnosis present

## 2024-04-13 DIAGNOSIS — Z91158 Patient's noncompliance with renal dialysis for other reason: Secondary | ICD-10-CM | POA: Diagnosis not present

## 2024-04-13 DIAGNOSIS — N2581 Secondary hyperparathyroidism of renal origin: Secondary | ICD-10-CM | POA: Diagnosis present

## 2024-04-13 DIAGNOSIS — I132 Hypertensive heart and chronic kidney disease with heart failure and with stage 5 chronic kidney disease, or end stage renal disease: Secondary | ICD-10-CM | POA: Diagnosis present

## 2024-04-13 DIAGNOSIS — D721 Eosinophilia, unspecified: Secondary | ICD-10-CM | POA: Diagnosis present

## 2024-04-13 DIAGNOSIS — Z9049 Acquired absence of other specified parts of digestive tract: Secondary | ICD-10-CM

## 2024-04-13 DIAGNOSIS — I5023 Acute on chronic systolic (congestive) heart failure: Secondary | ICD-10-CM | POA: Diagnosis present

## 2024-04-13 DIAGNOSIS — Z79899 Other long term (current) drug therapy: Secondary | ICD-10-CM

## 2024-04-13 DIAGNOSIS — R569 Unspecified convulsions: Secondary | ICD-10-CM | POA: Diagnosis present

## 2024-04-13 DIAGNOSIS — N186 End stage renal disease: Secondary | ICD-10-CM | POA: Diagnosis present

## 2024-04-13 DIAGNOSIS — E10649 Type 1 diabetes mellitus with hypoglycemia without coma: Secondary | ICD-10-CM | POA: Diagnosis not present

## 2024-04-13 DIAGNOSIS — Z885 Allergy status to narcotic agent status: Secondary | ICD-10-CM

## 2024-04-13 DIAGNOSIS — G40409 Other generalized epilepsy and epileptic syndromes, not intractable, without status epilepticus: Principal | ICD-10-CM | POA: Diagnosis present

## 2024-04-13 DIAGNOSIS — F319 Bipolar disorder, unspecified: Secondary | ICD-10-CM | POA: Diagnosis present

## 2024-04-13 DIAGNOSIS — Z880 Allergy status to penicillin: Secondary | ICD-10-CM

## 2024-04-13 DIAGNOSIS — Z833 Family history of diabetes mellitus: Secondary | ICD-10-CM

## 2024-04-13 DIAGNOSIS — J449 Chronic obstructive pulmonary disease, unspecified: Secondary | ICD-10-CM | POA: Diagnosis present

## 2024-04-13 DIAGNOSIS — J189 Pneumonia, unspecified organism: Secondary | ICD-10-CM | POA: Diagnosis present

## 2024-04-13 DIAGNOSIS — Z881 Allergy status to other antibiotic agents status: Secondary | ICD-10-CM

## 2024-04-13 DIAGNOSIS — E1022 Type 1 diabetes mellitus with diabetic chronic kidney disease: Secondary | ICD-10-CM | POA: Diagnosis present

## 2024-04-13 DIAGNOSIS — Z794 Long term (current) use of insulin: Secondary | ICD-10-CM

## 2024-04-13 DIAGNOSIS — G40909 Epilepsy, unspecified, not intractable, without status epilepticus: Secondary | ICD-10-CM

## 2024-04-13 DIAGNOSIS — R188 Other ascites: Secondary | ICD-10-CM | POA: Diagnosis present

## 2024-04-13 DIAGNOSIS — Z8619 Personal history of other infectious and parasitic diseases: Secondary | ICD-10-CM

## 2024-04-13 DIAGNOSIS — I428 Other cardiomyopathies: Secondary | ICD-10-CM | POA: Diagnosis present

## 2024-04-13 DIAGNOSIS — R748 Abnormal levels of other serum enzymes: Secondary | ICD-10-CM | POA: Diagnosis present

## 2024-04-13 DIAGNOSIS — Z992 Dependence on renal dialysis: Secondary | ICD-10-CM | POA: Diagnosis not present

## 2024-04-13 DIAGNOSIS — I5022 Chronic systolic (congestive) heart failure: Secondary | ICD-10-CM | POA: Diagnosis not present

## 2024-04-13 LAB — BASIC METABOLIC PANEL WITH GFR
Anion gap: 16 — ABNORMAL HIGH (ref 5–15)
Anion gap: 16 — ABNORMAL HIGH (ref 5–15)
BUN: 26 mg/dL — ABNORMAL HIGH (ref 6–20)
BUN: 26 mg/dL — ABNORMAL HIGH (ref 6–20)
CO2: 16 mmol/L — ABNORMAL LOW (ref 22–32)
CO2: 15 mmol/L — ABNORMAL LOW (ref 22–32)
Calcium: 7.4 mg/dL — ABNORMAL LOW (ref 8.9–10.3)
Calcium: 7.9 mg/dL — ABNORMAL LOW (ref 8.9–10.3)
Chloride: 91 mmol/L — ABNORMAL LOW (ref 98–111)
Chloride: 96 mmol/L — ABNORMAL LOW (ref 98–111)
Creatinine, Ser: 6.04 mg/dL — ABNORMAL HIGH (ref 0.44–1.00)
Creatinine, Ser: 5.84 mg/dL — ABNORMAL HIGH (ref 0.44–1.00)
GFR, Estimated: 9 mL/min — ABNORMAL LOW (ref >60)
GFR, Estimated: 9 mL/min — ABNORMAL LOW (ref >60)
Glucose, Bld: 1080 mg/dL (ref 70–99)
Glucose, Bld: 716 mg/dL (ref 70–99)
Potassium: 4.3 mmol/L (ref 3.5–5.1)
Potassium: 3.4 mmol/L — ABNORMAL LOW (ref 3.5–5.1)
Sodium: 123 mmol/L — ABNORMAL LOW (ref 135–145)
Sodium: 127 mmol/L — ABNORMAL LOW (ref 135–145)

## 2024-04-13 LAB — I-STAT VENOUS BLOOD GAS, ED
Acid-base deficit: 8 mmol/L — ABNORMAL HIGH (ref 0.0–2.0)
Bicarbonate: 16.9 mmol/L — ABNORMAL LOW (ref 20.0–28.0)
Calcium, Ion: 1.01 mmol/L — ABNORMAL LOW (ref 1.15–1.40)
HCT: 39 % (ref 36.0–46.0)
Hemoglobin: 13.3 g/dL (ref 12.0–15.0)
O2 Saturation: 92 %
Potassium: 4.1 mmol/L (ref 3.5–5.1)
Sodium: 123 mmol/L — ABNORMAL LOW (ref 135–145)
TCO2: 18 mmol/L — ABNORMAL LOW (ref 22–32)
pCO2, Ven: 33.1 mmHg — ABNORMAL LOW (ref 44–60)
pH, Ven: 7.316 (ref 7.25–7.43)
pO2, Ven: 68 mmHg — ABNORMAL HIGH (ref 32–45)

## 2024-04-13 LAB — CBC WITH DIFFERENTIAL/PLATELET
Abs Immature Granulocytes: 0.19 K/uL — ABNORMAL HIGH (ref 0.00–0.07)
Basophils Absolute: 0 K/uL (ref 0.0–0.1)
Basophils Relative: 1 %
Eosinophils Absolute: 0.4 K/uL (ref 0.0–0.5)
Eosinophils Relative: 5 %
HCT: 40.8 % (ref 36.0–46.0)
Hemoglobin: 11.9 g/dL — ABNORMAL LOW (ref 12.0–15.0)
Immature Granulocytes: 3 %
Lymphocytes Relative: 25 %
Lymphs Abs: 1.8 K/uL (ref 0.7–4.0)
MCH: 26.7 pg (ref 26.0–34.0)
MCHC: 29.2 g/dL — ABNORMAL LOW (ref 30.0–36.0)
MCV: 91.7 fL (ref 80.0–100.0)
Monocytes Absolute: 0.4 K/uL (ref 0.1–1.0)
Monocytes Relative: 6 %
Neutro Abs: 4.5 K/uL (ref 1.7–7.7)
Neutrophils Relative %: 60 %
Platelets: 178 K/uL (ref 150–400)
RBC: 4.45 MIL/uL (ref 3.87–5.11)
RDW: 14.6 % (ref 11.5–15.5)
WBC: 7.3 K/uL (ref 4.0–10.5)
nRBC: 0 % (ref 0.0–0.2)

## 2024-04-13 LAB — URINALYSIS, ROUTINE W REFLEX MICROSCOPIC
Bacteria, UA: NONE SEEN
Bilirubin Urine: NEGATIVE
Glucose, UA: >=500 mg/dL — AB
Hgb urine dipstick: NEGATIVE
Ketones, ur: 5 mg/dL — AB
Leukocytes,Ua: NEGATIVE
Nitrite: NEGATIVE
Protein, ur: >=300 mg/dL — AB
Specific Gravity, Urine: 1.018 (ref 1.005–1.030)
pH: 7 (ref 5.0–8.0)

## 2024-04-13 LAB — I-STAT CG4 LACTIC ACID, ED
Lactic Acid, Venous: 3.3 mmol/L (ref 0.5–1.9)
Lactic Acid, Venous: 3.9 mmol/L (ref 0.5–1.9)

## 2024-04-13 LAB — COMPREHENSIVE METABOLIC PANEL WITH GFR
ALT: 18 U/L (ref 0–44)
AST: 47 U/L — ABNORMAL HIGH (ref 15–41)
Albumin: 3.1 g/dL — ABNORMAL LOW (ref 3.5–5.0)
Alkaline Phosphatase: 362 U/L — ABNORMAL HIGH (ref 38–126)
Anion gap: 18 — ABNORMAL HIGH (ref 5–15)
BUN: 25 mg/dL — ABNORMAL HIGH (ref 6–20)
CO2: 17 mmol/L — ABNORMAL LOW (ref 22–32)
Calcium: 8.2 mg/dL — ABNORMAL LOW (ref 8.9–10.3)
Chloride: 88 mmol/L — ABNORMAL LOW (ref 98–111)
Creatinine, Ser: 5.95 mg/dL — ABNORMAL HIGH (ref 0.44–1.00)
GFR, Estimated: 9 mL/min — ABNORMAL LOW (ref >60)
Glucose, Bld: 1155 mg/dL (ref 70–99)
Potassium: 3.6 mmol/L (ref 3.5–5.1)
Sodium: 123 mmol/L — ABNORMAL LOW (ref 135–145)
Total Bilirubin: 1.4 mg/dL — ABNORMAL HIGH (ref 0.0–1.2)
Total Protein: 5.8 g/dL — ABNORMAL LOW (ref 6.5–8.1)

## 2024-04-13 LAB — CBG MONITORING, ED
Glucose-Capillary: >600 mg/dL (ref 70–99)
Glucose-Capillary: >600 mg/dL (ref 70–99)
Glucose-Capillary: >600 mg/dL (ref 70–99)
Glucose-Capillary: >600 mg/dL (ref 70–99)

## 2024-04-13 LAB — HCG, QUANTITATIVE, PREGNANCY: hCG, Beta Chain, Quant, S: 2 m[IU]/mL (ref <5)

## 2024-04-13 LAB — BETA-HYDROXYBUTYRIC ACID: Beta-Hydroxybutyric Acid: 1.84 mmol/L — ABNORMAL HIGH (ref 0.05–0.27)

## 2024-04-13 LAB — HCG, SERUM, QUALITATIVE: Preg, Serum: NEGATIVE

## 2024-04-13 LAB — GLUCOSE, CAPILLARY
Glucose-Capillary: >600 mg/dL (ref 70–99)
Glucose-Capillary: >600 mg/dL (ref 70–99)
Glucose-Capillary: 579 mg/dL (ref 70–99)
Glucose-Capillary: 485 mg/dL — ABNORMAL HIGH (ref 70–99)

## 2024-04-13 LAB — MAGNESIUM: Magnesium: 2.1 mg/dL (ref 1.7–2.4)

## 2024-04-13 MED ORDER — DEXTROSE 50 % IV SOLN
0.0000 mL | INTRAVENOUS | Status: DC | PRN
  Administered 2024-04-15: 09:00:00 12.5 mL via INTRAVENOUS
  Administered 2024-04-15: 03:00:00 25 mL via INTRAVENOUS
  Filled 2024-04-13: qty 50

## 2024-04-13 MED ORDER — PANTOPRAZOLE SODIUM 40 MG PO TBEC
40.0000 mg | DELAYED_RELEASE_TABLET | Freq: Two times a day (BID) | ORAL | Status: DC
  Administered 2024-04-13 – 2024-04-19 (×11): 40 mg via ORAL
  Filled 2024-04-13 (×11): qty 1

## 2024-04-13 MED ORDER — HYDROXYZINE HCL 25 MG PO TABS
25.0000 mg | ORAL_TABLET | Freq: Three times a day (TID) | ORAL | Status: DC | PRN
  Administered 2024-04-13 – 2024-04-19 (×3): 25 mg via ORAL
  Filled 2024-04-13 (×3): qty 1

## 2024-04-13 MED ORDER — HYDROCODONE-ACETAMINOPHEN 5-325 MG PO TABS
1.0000 | ORAL_TABLET | ORAL | Status: DC | PRN
  Administered 2024-04-13: 20:00:00 2 via ORAL
  Administered 2024-04-19: 1 via ORAL
  Filled 2024-04-13 (×2): qty 2
  Filled 2024-04-13: qty 1
  Filled 2024-04-13: qty 2
  Filled 2024-04-13: qty 1

## 2024-04-13 MED ORDER — HYDROMORPHONE HCL 1 MG/ML IJ SOLN
0.5000 mg | INTRAMUSCULAR | Status: DC | PRN
  Administered 2024-04-13 – 2024-04-18 (×23): 0.5 mg via INTRAVENOUS
  Filled 2024-04-13 (×23): qty 0.5

## 2024-04-13 MED ORDER — LEVETIRACETAM (KEPPRA) 500 MG/5 ML ADULT IV PUSH
500.0000 mg | Freq: Two times a day (BID) | INTRAVENOUS | Status: DC
  Administered 2024-04-13 – 2024-04-14 (×2): 500 mg via INTRAVENOUS
  Filled 2024-04-13 (×2): qty 5

## 2024-04-13 MED ORDER — LACTATED RINGERS IV BOLUS
20.0000 mL/kg | Freq: Once | INTRAVENOUS | Status: AC
  Administered 2024-04-13: 18:00:00 1180 mL via INTRAVENOUS

## 2024-04-13 MED ORDER — PROCHLORPERAZINE MALEATE 5 MG PO TABS: 5.0000 mg | ORAL_TABLET | Freq: Four times a day (QID) | ORAL | Status: DC | PRN

## 2024-04-13 MED ORDER — CARVEDILOL 12.5 MG PO TABS
25.0000 mg | ORAL_TABLET | Freq: Two times a day (BID) | ORAL | Status: DC
  Administered 2024-04-14 – 2024-04-19 (×8): 25 mg via ORAL
  Filled 2024-04-13 (×8): qty 2

## 2024-04-13 MED ORDER — SACUBITRIL-VALSARTAN 24-26 MG PO TABS
1.0000 | ORAL_TABLET | Freq: Two times a day (BID) | ORAL | Status: DC
  Administered 2024-04-13 – 2024-04-19 (×11): 1 via ORAL
  Filled 2024-04-13 (×12): qty 1

## 2024-04-13 MED ORDER — ENOXAPARIN SODIUM 40 MG/0.4ML IJ SOSY: 40.0000 mg | PREFILLED_SYRINGE | INTRAMUSCULAR | Status: DC

## 2024-04-13 MED ORDER — ROSUVASTATIN CALCIUM 20 MG PO TABS
40.0000 mg | ORAL_TABLET | Freq: Every day | ORAL | Status: DC
  Administered 2024-04-13 – 2024-04-16 (×3): 40 mg via ORAL
  Filled 2024-04-13 (×3): qty 2

## 2024-04-13 MED ORDER — INSULIN ASPART 100 UNIT/ML IJ SOLN
10.0000 [IU] | Freq: Once | INTRAMUSCULAR | Status: AC
  Administered 2024-04-13: 16:00:00 10 [IU] via SUBCUTANEOUS

## 2024-04-13 MED ORDER — INSULIN REGULAR(HUMAN) IN NACL 100-0.9 UT/100ML-% IV SOLN
INTRAVENOUS | Status: DC
  Administered 2024-04-13: 18:00:00 5.5 [IU]/h via INTRAVENOUS
  Filled 2024-04-13: qty 100

## 2024-04-13 MED ORDER — DEXTROSE IN LACTATED RINGERS 5 % IV SOLN: INTRAVENOUS | Status: AC

## 2024-04-13 MED ORDER — ONDANSETRON HCL 4 MG/2ML IJ SOLN
4.0000 mg | Freq: Four times a day (QID) | INTRAMUSCULAR | Status: DC | PRN
  Administered 2024-04-14: 04:00:00 4 mg via INTRAVENOUS
  Filled 2024-04-13: qty 2

## 2024-04-13 MED ORDER — LACTATED RINGERS IV SOLN: INTRAVENOUS | Status: AC

## 2024-04-13 MED ORDER — LABETALOL HCL 5 MG/ML IV SOLN
10.0000 mg | INTRAVENOUS | Status: DC | PRN
  Administered 2024-04-13 – 2024-04-14 (×2): 10 mg via INTRAVENOUS
  Filled 2024-04-13 (×2): qty 4

## 2024-04-13 MED ORDER — POTASSIUM CHLORIDE 10 MEQ/100ML IV SOLN
10.0000 meq | INTRAVENOUS | Status: AC
  Administered 2024-04-13 (×2): 10 meq via INTRAVENOUS
  Filled 2024-04-13 (×2): qty 100

## 2024-04-13 MED ORDER — ONDANSETRON HCL 4 MG PO TABS: 4.0000 mg | ORAL_TABLET | Freq: Four times a day (QID) | ORAL | Status: DC | PRN

## 2024-04-13 MED ORDER — DULOXETINE HCL 20 MG PO CPEP
20.0000 mg | ORAL_CAPSULE | Freq: Every day | ORAL | Status: DC
  Administered 2024-04-13 – 2024-04-19 (×6): 20 mg via ORAL
  Filled 2024-04-13 (×6): qty 1

## 2024-04-13 MED ORDER — HEPARIN SODIUM (PORCINE) 5000 UNIT/ML IJ SOLN
5000.0000 [IU] | Freq: Three times a day (TID) | INTRAMUSCULAR | Status: DC
  Administered 2024-04-13 – 2024-04-18 (×10): 5000 [IU] via SUBCUTANEOUS
  Filled 2024-04-13 (×14): qty 1

## 2024-04-13 MED ORDER — ACETAMINOPHEN 650 MG RE SUPP: 650.0000 mg | Freq: Four times a day (QID) | RECTAL | Status: DC | PRN

## 2024-04-13 MED ORDER — INSULIN ASPART 100 UNIT/ML IJ SOLN
10.0000 [IU] | Freq: Once | INTRAMUSCULAR | Status: DC
  Filled 2024-04-13: qty 10

## 2024-04-13 MED ORDER — OLANZAPINE 2.5 MG PO TABS
5.0000 mg | ORAL_TABLET | Freq: Every day | ORAL | Status: DC
  Administered 2024-04-13 – 2024-04-18 (×6): 5 mg via ORAL
  Filled 2024-04-13 (×6): qty 2

## 2024-04-13 MED ORDER — ACETAMINOPHEN 325 MG PO TABS
650.0000 mg | ORAL_TABLET | Freq: Four times a day (QID) | ORAL | Status: DC | PRN
  Administered 2024-04-14 – 2024-04-15 (×2): 650 mg via ORAL
  Filled 2024-04-13: qty 2

## 2024-04-13 NOTE — ED Provider Notes (Signed)
 [place] EMERGENCY DEPARTMENT AT [facility] Provider Note   CSN: [id number] Arrival date & time: 04/13/24  1452     Patient presents with: Seizures   Ashley Freeman is a 33 y.o. female.    Seizures    This patient is a ill-appearing 33 year old female known history of end-stage renal disease on dialysis in the right upper extremity, goes Tuesday Thursday Saturday, unknown if she has missed dialysis, she has been in and out of the hospital over the last few months with frequent admissions for poorly controlled blood sugar, intermittent DKA, recent respiratory failure with pneumonia, recently placed on steroids, recently admitted and discharged within the last week, presents today after having witnessed seizures by family members who stated there was about 2 minutes of tonic-clonic activity followed by a dense postictal period.  The paramedics were not able to obtain IV access, noted her blood sugar to be over 590, vital signs otherwise did not show hypotension or fever or hypoxia.  Patient not able to give any information, level 5 caveat applies secondary to altered mental status  Prior to Admission medications  Medication Sig Start Date End Date Taking? Authorizing Provider  albuterol  (VENTOLIN  HFA) 108 (90 Base) MCG/ACT inhaler Inhale 2 puffs into the lungs every 4 (four) hours as needed for wheezing or shortness of breath. 11/24/22   [provider]  bumetanide  (BUMEX ) 2 MG tablet Take 10 mg by mouth daily.    [provider]  carvedilol  (COREG ) 25 MG tablet Take 25 mg by mouth 2 (two) times daily with a meal.    [provider]  Cinnamon 500 MG capsule Take 500 mg by mouth in the morning.    [provider]  DULoxetine  (CYMBALTA ) 20 MG capsule Take 20 mg by mouth daily. 07/29/23   [provider]  Elderberry-Vitamin C-Zinc  (ELDERBERRY IMMUNE HEALTH GUMMY PO) Take 2 each by mouth in the morning.    [provider]  fluticasone  (FLONASE ) 50 MCG/ACT nasal spray Place 2 sprays into both nostrils daily as needed for allergies or rhinitis. 12/11/23 12/10/24  [provider]  hydrOXYzine  (ATARAX ) 25 MG tablet Take 25 mg by mouth 3 (three) times daily as needed for anxiety, itching, nausea or vomiting.    [provider]  lamoTRIgine  (LAMICTAL ) 200 MG tablet Take 1 tablet (200 mg total) by mouth daily. 10/29/23   Regalado, Belkys A, MD  LANTUS  SOLOSTAR 100 UNIT/ML Solostar Pen Inject 3 Units into the skin daily. 04/08/24   Elgergawy, Brayton RAMAN, MD  NOVOLOG  FLEXPEN 100 UNIT/ML FlexPen Before each meal 3 times a day, 140-199 - 2 units, 200-250 - 4 units, 251-299 - 6 units,  300-349 - 7 units,  350 or above 8 units. 03/30/24   Dennise Lavada POUR, MD  OLANZapine  (ZYPREXA ) 5 MG tablet Take 5 mg by mouth at bedtime.    [provider]  Pancrelipase , Lip-Prot-Amyl, 3000-9500 units CPEP Take 3,000 Units by mouth in the morning, at noon, and at bedtime.    [provider]  pantoprazole  (PROTONIX ) 40 MG tablet Take 1 tablet (40 mg total) by mouth 2 (two) times daily. 02/07/24 04/10/24  Drusilla Sabas RAMAN, MD  prochlorperazine  (COMPAZINE ) 5 MG tablet Take 5 mg by mouth every 6 (six) hours as needed for nausea or vomiting.    [provider]  rosuvastatin  (CRESTOR ) 40 MG tablet Take 40 mg by mouth daily. 01/20/24 01/19/25  [provider]  sacubitril -valsartan  (ENTRESTO ) 24-26 MG Take 1  tablet by mouth 2 (two) times daily.    [provider]  sevelamer  carbonate (RENVELA ) 800 MG tablet Take 1 tablet (800 mg total) by mouth 3 (three) times daily with meals. 02/07/24   Drusilla Sabas RAMAN, MD  sodium bicarbonate  650 MG tablet Take 650 mg by mouth 2 (two) times daily. Patient not taking: Reported on 04/07/2024 07/29/23   [provider]  SUMAtriptan  (IMITREX ) 50 MG tablet Take 50 mg by mouth every 2 (two) hours as needed for migraine or headache. 06/20/22   [provider]  VELTASSA  16.8 g PACK Take 1 Dose by mouth daily at 12 noon. 04/03/24   [provider]    Allergies: Keflex  [cephalexin ], Penicillins, Vibramycin  [doxycycline ], Benadryl  [diphenhydramine ], Dilaudid  [hydromorphone ], Methotrexate and trimetrexate, and Roxicodone  [oxycodone ]    Review of Systems  Neurological:  Positive for seizures.  All other systems reviewed and are negative.   Updated Vital Signs Ht 1.6 m (5' 3)   Wt 59 kg   BMI 23.03 kg/m   Physical Exam Vitals and nursing note reviewed.  Constitutional:      General: She is in acute distress.     Appearance: She is well-developed.  HENT:     Head: Normocephalic and atraumatic.     Mouth/Throat:     Pharynx: No oropharyngeal exudate.     Comments: Mouth is dry, no signs of tongue biting, dentition appears intact Eyes:     General: No scleral icterus.       Right eye: No discharge.        Left eye: No discharge.     Conjunctiva/sclera: Conjunctivae normal.     Pupils: Pupils are equal, round, and reactive to light.  Neck:     Thyroid : No thyromegaly.     Vascular: No JVD.  Cardiovascular:     Rate and Rhythm: Normal rate and regular rhythm.     Heart sounds: Normal heart sounds. No murmur heard.    No friction rub. No gallop.     Comments: Good thrill in the right upper extremity fistula, no overlying redness Pulmonary:     Effort: Pulmonary effort is normal. No respiratory distress.     Breath sounds: Normal breath sounds. No wheezing or rales.  Abdominal:     General: Bowel sounds are normal. There is no distension.     Palpations: Abdomen is soft. There is no mass.     Tenderness: There is no abdominal tenderness.  Musculoskeletal:        General: No tenderness. Normal range of motion.     Cervical back: Normal range of motion and neck supple.     Right lower leg: No edema.     Left lower leg: No edema.  Lymphadenopathy:     Cervical: No cervical adenopathy.  Skin:    General: Skin  is warm and dry.     Findings: No erythema or rash.  Neurological:     Coordination: Coordination normal.     Comments: Patient is somnolent, not able to answer questions, she mumbles answers here and there, seems to be moving all 4 extremities to painful stimuli, no obvious facial droop  Psychiatric:        Behavior: Behavior normal.     (all labs ordered are listed, but only abnormal results are displayed) Labs Reviewed  CBC WITH DIFFERENTIAL/PLATELET - Abnormal; Notable for the following components:      Result Value   Hemoglobin 11.9 (*)    MCHC 29.2 (*)  Abs Immature Granulocytes 0.19 (*)    All other components within normal limits  COMPREHENSIVE METABOLIC PANEL WITH GFR - Abnormal; Notable for the following components:   Sodium 123 (*)    Chloride 88 (*)    CO2 17 (*)    Glucose, Bld 1,155 (*)    BUN 25 (*)    Creatinine, Ser 5.95 (*)    Calcium  8.2 (*)    Total Protein 5.8 (*)    Albumin  3.1 (*)    AST 47 (*)    Alkaline Phosphatase 362 (*)    Total Bilirubin 1.4 (*)    GFR, Estimated 9 (*)    Anion gap 18 (*)    All other components within normal limits  BETA-HYDROXYBUTYRIC ACID - Abnormal; Notable for the following components:   Beta-Hydroxybutyric Acid 1.84 (*)    All other components within normal limits  CBG MONITORING, ED - Abnormal; Notable for the following components:   Glucose-Capillary >600 (*)    All other components within normal limits  CBG MONITORING, ED - Abnormal; Notable for the following components:   Glucose-Capillary >600 (*)    All other components within normal limits  CULTURE, BLOOD (ROUTINE X 2)  CULTURE, BLOOD (ROUTINE X 2)  HCG, QUANTITATIVE, PREGNANCY  MAGNESIUM   URINALYSIS, ROUTINE W REFLEX MICROSCOPIC  HCG, SERUM, QUALITATIVE  BASIC METABOLIC PANEL WITH GFR  BASIC METABOLIC PANEL WITH GFR  BASIC METABOLIC PANEL WITH GFR  BASIC METABOLIC PANEL WITH GFR  I-STAT CG4 LACTIC ACID, ED  I-STAT VENOUS BLOOD GAS, ED    EKG: EKG  Interpretation Date/Time:  Monday April 13 2024 15:09:34 EST Ventricular Rate:  100 PR Interval:  168 QRS Duration:  91 QT Interval:  407 QTC Calculation: 525 R Axis:   30  Text Interpretation: Sinus tachycardia Consider anterior infarct Borderline repolarization abnormality Prolonged QT interval Confirmed by Cleotilde Rogue ([id number]) on 04/13/2024 4:56:40 PM  Radiology: [facility] Chest Port 1 View Result Date: 04/13/2024 EXAM: 1 VIEW(S) XRAY OF THE CHEST 04/13/2024 04:21:00 PM COMPARISON: 04/06/2024 CLINICAL HISTORY: cough FINDINGS: LINES, TUBES AND DEVICES: Right axillary stent. LUNGS AND PLEURA: Asymmetric interstitial/perihilar prominence, right lung predominant, favoring asymmetric interstitial edema over multifocal infection. Superimposed right lower lobe opacity favors pneumonia over atelectasis. Small bilateral pleural effusions, layering on the right. No pneumothorax. HEART AND MEDIASTINUM: Mild cardiomegaly is improved. BONES AND SOFT TISSUES: No acute osseous abnormality. IMPRESSION: 1. Suspected asymmetric interstitial edema, right lung predominant, less likely multifocal infection. 2. Superimposed right lower lobe opacity, favoring pneumonia over atelectasis. 3. Small bilateral pleural effusions, layering on the right. Electronically signed by: Pinkie Pebbles MD 04/13/2024 05:04 PM EST RP Workstation: [id number]   CT Head Wo Contrast Result Date: 04/13/2024 EXAM: CT HEAD WITHOUT 04/13/2024 04:33:32 PM TECHNIQUE: CT of the head was performed without the administration of intravenous contrast. Automated exposure control, iterative reconstruction, and/or weight based adjustment of the mA/kV was utilized to reduce the radiation dose to as low as reasonably achievable. COMPARISON: 11/11/2023 CLINICAL HISTORY: Seizure, new-onset, no history of trauma FINDINGS: BRAIN AND VENTRICLES: No acute intracranial hemorrhage. No mass effect or midline shift. No extra-axial fluid collection. No evidence of  acute infarct. No hydrocephalus. Atherosclerosis of the carotid siphons and intracranial vertebral arteries. ORBITS: Bilateral lens replacement. SINUSES AND MASTOIDS: Mild mucosal thickening in the paranasal sinuses. SOFT TISSUES AND SKULL: No acute skull fracture. There is diffuse stranding of the facial subcutaneous fat bilaterally which could reflect edema or anasarca. IMPRESSION: 1. No acute intracranial abnormality. 2. Skull  base atherosclerosis greater than expected for age. 3. There is diffuse stranding of the facial subcutaneous fat bilaterally which could reflect edema/anasarca. Electronically signed by: Donnice Mania MD 04/13/2024 04:42 PM EST RP Workstation: [id number]     .Critical Care  Performed by: Cleotilde Rogue, MD Authorized by: Cleotilde Rogue, MD   Critical care provider statement:    Critical care time (minutes):  45   Critical care time was exclusive of:  Separately billable procedures and treating other patients and teaching time   Critical care was necessary to treat or prevent imminent or life-threatening deterioration of the following conditions:  Endocrine crisis   Critical care was time spent personally by me on the following activities:  Development of treatment plan with patient or surrogate, discussions with consultants, evaluation of patient's response to treatment, examination of patient, obtaining history from patient or surrogate, review of old charts, re-evaluation of patient's condition, pulse oximetry, ordering and review of radiographic studies, ordering and review of laboratory studies and ordering and performing treatments and interventions   I assumed direction of critical care for this patient from another provider in my specialty: no     Care discussed with: admitting provider   Comments:          Medications Ordered in the ED  lactated ringers  bolus 1,180 mL (has no administration in time range)  insulin  regular, human (MYXREDLIN ) 100 units/ 100 mL  infusion (has no administration in time range)  lactated ringers  infusion (has no administration in time range)  dextrose  5 % in lactated ringers  infusion (has no administration in time range)  dextrose  50 % solution 0-50 mL (has no administration in time range)  potassium chloride  10 mEq in 100 mL IVPB (has no administration in time range)  insulin  aspart (novoLOG ) injection 10 Units (10 Units Subcutaneous Given 04/13/24 1546)                                    Medical Decision Making Amount and/or Complexity of Data Reviewed Labs: ordered. Radiology: ordered. ECG/medicine tests: ordered.  Risk Prescription drug management. Decision regarding hospitalization.    This patient presents to the ED for concern of altered mental status, possible seizure activity, severe hyperglycemia, this involves an extensive number of treatment options, and is a complaint that carries with it a high risk of complications and morbidity.  The differential diagnosis includes poorly controlled diabetes, I do not know that there is a clear history of seizure disorder in fact she is not on anything for seizures and in fact she has never had an EEG in the system that I can see, that being said she has been admitted frequently for blood sugar related illnesses.   Co morbidities / Chronic conditions that complicate the patient evaluation  Poorly controlled diabetes, end-stage renal disease   Additional history obtained:  Additional history obtained from EMR External records from outside source obtained and reviewed including medical record, reviewed chart at length Recent admissions and discharges   Lab Tests:  I Ordered, and personally interpreted labs.  The pertinent results include: Severe hyperglycemia over 1000, there is an anion gap acidosis with a low CO2   Imaging Studies ordered:  I ordered imaging studies including CT scan of the brain and chest x-ray I independently visualized and  interpreted imaging which showed x-ray with interstitial edema, asymmetrical, likely right lower lobe pneumonia, CT scan of the head without acute  findings I agree with the radiologist interpretation   Cardiac Monitoring: / EKG:  The patient was maintained on a cardiac monitor.  I personally viewed and interpreted the cardiac monitored which showed an underlying rhythm of: Borderline tachycardia   Problem List / ED Course / Critical interventions / Medication management  COPD I ordered medication including insulin  drip, insulin  bolus, fluid bolus Reevaluation of the patient after these medicines showed that the patient no significant improvement I have reviewed the patients home medicines and have made adjustments as needed   Consultations Obtained:  I requested consultation with the hospitalist,  and discussed lab and imaging findings as well as pertinent plan - they recommend: Admission   Social Determinants of Health:  Poorly controlled diabetic dialysis   Test / Admission - Considered:  Admit to  high level of care Critically ill with DKA      Final diagnoses:  Diabetic ketoacidosis without coma associated with type 1 diabetes mellitus (HCC)  New onset seizure (HCC)       Cleotilde Rogue, MD 04/13/24 2328

## 2024-04-13 NOTE — Progress Notes (Signed)
 [facility] night cross cover note:   Patient's RN conveys that the patient is complaining of generalized pain refractory to existing order for as needed Norco.  I subsequently added as needed IV Dilaudid  for pain refractory to Norco.    Additionally, systolic blood pressures in the 180s to low 200s, with HR's in the 90's to low 100's following evening coreg . I added as needed IV labetalol  for blood pressure greater than 180 mmHg.    Eva Pore, DO Hospitalist

## 2024-04-13 NOTE — ED Triage Notes (Addendum)
 Pt transported from home w/reports of grand mal seizure activity per mother Cbg 598, HD unkn days, unkn last procedure.  HR 100-120. EMS reports pt very resistive initially with care measures, mother states this behavior is normal after seizures. Recently admitted fro [facility].

## 2024-04-13 NOTE — H&P (Incomplete)
 History and Physical    Ashley Freeman FMW:[id number] DOB: 05/11/1990 DOA: 04/13/2024  PCP: Keven Crumbly Pap, MD   Chief Complaint:  seizure  HPI: Ashley Freeman is a 33 y.o. female with medical history significant of bipolar, type 1 diabetes, ESRD, heart failure reduced ejection fraction, hypertension, hyperlipidemia who Emergency Department after witnessed seizure.  Patient was just discharged from the hospital on 12/10 after being admitted with DKA and recurrent ascites.  She has had numerous previous presentations leaving AGAINST MEDICAL ADVICE.  She presented today after a witnessed seizure.  EMS was called and she was postictal.  She was transported to the ER where she was found to be afebrile and hemodynamically stable.  Labs were obtained on presentation which showed glucose 1155, hemoglobin 11.9, beta hydroxybutyrate 1.8, chest x-ray showed no acute findings with volume overload.  CT head showed no acute findings. Patient was transported to the ER given fluids and started on insulin . On admission she was started on Keppra . Patient was persistently itchy and endroses abdominal pain.    Review of Systems: Review of Systems  Constitutional:  Negative for chills and fever.  HENT: Negative.    Eyes: Negative.   Respiratory: Negative.    Cardiovascular: Negative.   Gastrointestinal: Negative.   Genitourinary: Negative.   Musculoskeletal: Negative.   Skin: Negative.   Neurological: Negative.   Endo/Heme/Allergies: Negative.   Psychiatric/Behavioral: Negative.    All other systems reviewed and are negative.    As per HPI otherwise 10 point review of systems negative.   Allergies[1]  Past Medical History:  Diagnosis Date   Abscess, gluteal, right 08/24/2013   Anemia 02/19/2012   Bartholin's gland abscess 09/19/2013   Bipolar disorder (HCC)    BV (bacterial vaginosis) 11/24/2015   Depression    Diabetes mellitus type I (HCC) 2001   Diagnosed at  age 20 ; Type I   Diarrhea 05/30/2016   DKA (diabetic ketoacidoses) 08/19/2013   Also in 2018   ESRD (end stage renal disease) (HCC)    Gonorrhea 08/2011   Treated in 09/2011   HFrEF (heart failure with reduced ejection fraction) (HCC)    a. 2022 Echo: EF 40%; b. 10/2021 Echo: EF 55%; b. 07/2022 MV: No ischemia. EF 31%; c. 08/2022 Echo: EF 35%, mildly dil RV, sev TR.   History of trichomoniasis 05/31/2016   Hyperlipidemia 03/28/2016   Hypertension    NICM (nonischemic cardiomyopathy) (HCC)    Sepsis (HCC) 09/19/2013    Past Surgical History:  Procedure Laterality Date   A/V FISTULAGRAM Right 06/17/2023   Procedure: A/V Fistulagram;  Surgeon: Marea Selinda GORMAN, MD;  Location: [facility] INVASIVE CV LAB;  Service: Cardiovascular;  Laterality: Right;   A/V SHUNT INTERVENTION N/A 09/25/2023   Procedure: A/V SHUNT INTERVENTION;  Surgeon: Pearline Norman GORMAN, MD;  Location: [facility] LAB;  Service: Cardiovascular;  Laterality: N/A;   AV FISTULA PLACEMENT Right 07/06/2022   Procedure: ARTERIOVENOUS GRAFT CREATION;  Surgeon: Gretta Lonni PARAS, MD;  Location: [facility] OR;  Service: Vascular;  Laterality: Right;   CESAREAN SECTION N/A 10/05/2019   Procedure: CESAREAN SECTION;  Surgeon: Izell Harari, MD;  Location: [facility] LD ORS;  Service: Obstetrics;  Laterality: N/A;   CHOLECYSTECTOMY N/A 07/02/2023   Procedure: LAPAROSCOPIC CHOLECYSTECTOMY;  Surgeon: Ebbie Cough, MD;  Location[facility] OR;  Service: General;  Laterality: N/A;   ESOPHAGOGASTRODUODENOSCOPY N/A 01/31/2024   Procedure: EGD (ESOPHAGOGASTRODUODENOSCOPY);  Surgeon: San Sandor GAILS, DO;  Location: [facility] ENDOSCOPY;  Service: Gastroenterology;  Laterality: N/A;  INCISION AND DRAINAGE ABSCESS Left 09/28/2019   Procedure: INCISION AND DRAINAGE VULVAR ABCESS;  Surgeon: Edsel Norleen GAILS, MD;  Location: [facility] OR;  Service: Gynecology;  Laterality: Left;   INCISION AND DRAINAGE ABSCESS Right 02/23/2024   Procedure: INCISION AND DRAINAGE, ABSCESS;  Surgeon: Tobie Eldora NOVAK, MD;  Location: [facility] OR;  Service: ENT;  Laterality: Right;   INCISION AND DRAINAGE PERIRECTAL ABSCESS Right 08/18/2013   Procedure: IRRIGATION AND DEBRIDEMENT GLUTEAL ABSCESS;  Surgeon: Lynda Leos, MD;  Location: [facility] OR;  Service: General;  Laterality: Right;   INCISION AND DRAINAGE PERIRECTAL ABSCESS Right 09/19/2013   Procedure: IRRIGATION AND DEBRIDEMENT RIGHT GLUTEAL AND LABIAL ABSCESSES;  Surgeon: Lynda Leos, MD;  Location: [facility] OR;  Service: General;  Laterality: Right;   INCISION AND DRAINAGE PERIRECTAL ABSCESS Right 09/24/2013   Procedure: IRRIGATION AND DEBRIDEMENT PERIRECTAL ABSCESS;  Surgeon: Lynwood MALVA Pina, MD;  Location: [facility] OR;  Service: General;  Laterality: Right;   IR PARACENTESIS  08/28/2023   IR PARACENTESIS  11/04/2023   IR PARACENTESIS  12/23/2023   IR PARACENTESIS  02/04/2024   IR PARACENTESIS  03/23/2024   IR PARACENTESIS  04/06/2024     reports that she has never smoked. She has never been exposed to tobacco smoke. She has never used smokeless tobacco. She reports that she does not currently use alcohol . She reports that she does not use drugs.  Family History  Problem Relation Age of Onset   Asthma Mother    Carpal tunnel syndrome Mother    Gout Father    Diabetes Paternal Grandmother    Anesthesia problems Neg Hx     Prior to Admission medications  Medication Sig Start Date End Date Taking? Authorizing Provider  albuterol  (VENTOLIN  HFA) 108 (90 Base) MCG/ACT inhaler Inhale 2 puffs into the lungs every 4 (four) hours as needed for wheezing or shortness of breath. 11/24/22   [provider]  bumetanide  (BUMEX ) 2 MG tablet Take 10 mg by mouth daily.    [provider]  carvedilol  (COREG ) 25 MG tablet Take 25 mg by mouth 2 (two) times daily with a meal.    [provider]  Cinnamon 500 MG capsule Take 500 mg by mouth in the morning.    [provider]  DULoxetine  (CYMBALTA ) 20 MG capsule Take 20 mg by mouth daily. 07/29/23    [provider]  Elderberry-Vitamin C-Zinc  (ELDERBERRY IMMUNE HEALTH GUMMY PO) Take 2 each by mouth in the morning.    [provider]  fluticasone  (FLONASE ) 50 MCG/ACT nasal spray Place 2 sprays into both nostrils daily as needed for allergies or rhinitis. 12/11/23 12/10/24  [provider]  hydrOXYzine  (ATARAX ) 25 MG tablet Take 25 mg by mouth 3 (three) times daily as needed for anxiety, itching, nausea or vomiting.    [provider]  lamoTRIgine  (LAMICTAL ) 200 MG tablet Take 1 tablet (200 mg total) by mouth daily. 10/29/23   Regalado, Belkys A, MD  LANTUS  SOLOSTAR 100 UNIT/ML Solostar Pen Inject 3 Units into the skin daily. 04/08/24   Elgergawy, Brayton RAMAN, MD  NOVOLOG  FLEXPEN 100 UNIT/ML FlexPen Before each meal 3 times a day, 140-199 - 2 units, 200-250 - 4 units, 251-299 - 6 units,  300-349 - 7 units,  350 or above 8 units. 03/30/24   Singh, Prashant K, MD  OLANZapine  (ZYPREXA ) 5 MG tablet Take 5 mg by mouth at bedtime.    [provider]  Pancrelipase , Lip-Prot-Amyl, 3000-9500 units CPEP Take 3,000 Units by  mouth in the morning, at noon, and at bedtime.    [provider]  pantoprazole  (PROTONIX ) 40 MG tablet Take 1 tablet (40 mg total) by mouth 2 (two) times daily. 02/07/24 04/10/24  Drusilla Sabas RAMAN, MD  prochlorperazine  (COMPAZINE ) 5 MG tablet Take 5 mg by mouth every 6 (six) hours as needed for nausea or vomiting.    [provider]  rosuvastatin  (CRESTOR ) 40 MG tablet Take 40 mg by mouth daily. 01/20/24 01/19/25  [provider]  sacubitril -valsartan  (ENTRESTO ) 24-26 MG Take 1 tablet by mouth 2 (two) times daily.    [provider]  sevelamer  carbonate (RENVELA ) 800 MG tablet Take 1 tablet (800 mg total) by mouth 3 (three) times daily with meals. 02/07/24   Drusilla Sabas RAMAN, MD  sodium bicarbonate  650 MG tablet Take 650 mg by mouth 2 (two) times daily. Patient not taking: Reported on 04/07/2024 07/29/23   [provider]  SUMAtriptan  (IMITREX ) 50 MG tablet Take 50 mg by mouth every 2 (two) hours as needed for migraine or headache. 06/20/22   [provider]  VELTASSA  16.8 g PACK Take 1 Dose by mouth daily at 12 noon. 04/03/24   [provider]    Physical Exam: Vitals:   04/13/24 1457 04/13/24 1842 04/13/24 2002 04/13/24 2006  BP:  (!) 182/100  (!) 182/115  Pulse:  99  88  Resp:  17    Temp:   98 F (36.7 C)   TempSrc:   Oral   SpO2:  100%  99%  Weight: 59 kg     Height: 5' 3 (1.6 m)      Physical Exam Vitals reviewed.  Constitutional:      Appearance: She is normal weight.  HENT:     Head: Normocephalic.     Nose: Nose normal.     Mouth/Throat:     Mouth: Mucous membranes are moist.     Pharynx: Oropharynx is clear.  Eyes:     Conjunctiva/sclera: Conjunctivae normal.     Pupils: Pupils are equal, round, and reactive to light.  Cardiovascular:     Rate and Rhythm: Normal rate and regular rhythm.     Pulses: Normal pulses.     Heart sounds: Normal heart sounds.  Pulmonary:     Effort: Pulmonary effort is normal.     Breath sounds: Normal breath sounds.  Abdominal:     General: Abdomen is flat. Bowel sounds are normal.     Palpations: Abdomen is soft.  Musculoskeletal:        General: Normal range of motion.     Cervical back: Normal range of motion.  Skin:    General: Skin is warm.     Capillary Refill: Capillary refill takes less than 2 seconds.  Neurological:     General: No focal deficit present.     Mental Status: She is alert. Mental status is at baseline.  Psychiatric:        Mood and Affect: Mood normal.        Labs on Admission: I have personally reviewed the patients's labs and imaging studies.  Assessment/Plan Principal Problem:   Seizure (HCC)   # Seizure 2/2 metabolic derangements.  - Patient reportedly had a witnessed grand mal seizure - Not aware of any prior seizure activity Send plan: Start Keppra , per neuro. Will  consult if patient has another seizure.,   # Type 1 diabetes- # Hyperglycemia # Diabetic ketoacidosis - Patient hyperglycemic with glucose greater than  thousand, bicarb 16, beta hydroxybutyrate 1.8  Plan: Continue insulin  drip  # Bipolar disorder-continue home hydroxyzine , Lamictal   # Acute on chronic heart failure with reduced ejection fraction exacerbation-patient appears volume overloaded is noncompliant with dialysis.  Will need dialysis consultation in the morning  # Hemodialysis-consult nephrology  Admission status: Inpatient Progressive  Certification: The appropriate patient status for this patient is INPATIENT. Inpatient status is judged to be reasonable and necessary in order to provide the required intensity of service to ensure the patient's safety. The patient's presenting symptoms, physical exam findings, and initial radiographic and laboratory data in the context of their chronic comorbidities is felt to place them at high risk for further clinical deterioration. Furthermore, it is not anticipated that the patient will be medically stable for discharge from the hospital within 2 midnights of admission.   * I certify that at the point of admission it is my clinical judgment that the patient will require inpatient hospital care spanning beyond 2 midnights from the point of admission due to high intensity of service, high risk for further deterioration and high frequency of surveillance required.DEWAINE Lamar Dess MD [facility] If 7PM-7AM, please contact night-coverage [email or web address]  04/13/2024, 8:51 PM        [1]  Allergies Allergen Reactions   Keflex  [Cephalexin ] Anaphylaxis    Ceftriaxone  in the past with no reaction   Penicillins Anaphylaxis, Hives and Rash   Vibramycin  [Doxycycline ] Anaphylaxis   Benadryl  [Diphenhydramine ] Itching   Dilaudid  [Hydromorphone ] Itching   Methotrexate And Trimetrexate Rash   Roxicodone  [Oxycodone ] Itching    Takes  Percocet without issue

## 2024-04-14 ENCOUNTER — Other Ambulatory Visit: Payer: Self-pay

## 2024-04-14 ENCOUNTER — Encounter (HOSPITAL_COMMUNITY): Payer: Self-pay | Admitting: Internal Medicine

## 2024-04-14 DIAGNOSIS — R569 Unspecified convulsions: Secondary | ICD-10-CM | POA: Diagnosis not present

## 2024-04-14 DIAGNOSIS — E1010 Type 1 diabetes mellitus with ketoacidosis without coma: Secondary | ICD-10-CM | POA: Diagnosis not present

## 2024-04-14 DIAGNOSIS — I5022 Chronic systolic (congestive) heart failure: Secondary | ICD-10-CM | POA: Diagnosis not present

## 2024-04-14 DIAGNOSIS — Z992 Dependence on renal dialysis: Secondary | ICD-10-CM | POA: Diagnosis not present

## 2024-04-14 DIAGNOSIS — N186 End stage renal disease: Secondary | ICD-10-CM | POA: Diagnosis not present

## 2024-04-14 DIAGNOSIS — E1022 Type 1 diabetes mellitus with diabetic chronic kidney disease: Secondary | ICD-10-CM | POA: Diagnosis not present

## 2024-04-14 DIAGNOSIS — I132 Hypertensive heart and chronic kidney disease with heart failure and with stage 5 chronic kidney disease, or end stage renal disease: Secondary | ICD-10-CM | POA: Diagnosis not present

## 2024-04-14 LAB — CBC WITH DIFFERENTIAL/PLATELET
Abs Immature Granulocytes: 0.05 K/uL (ref 0.00–0.07)
Basophils Absolute: 0 K/uL (ref 0.0–0.1)
Basophils Relative: 1 %
Eosinophils Absolute: 0.8 K/uL — ABNORMAL HIGH (ref 0.0–0.5)
Eosinophils Relative: 9 %
HCT: 38.4 % (ref 36.0–46.0)
Hemoglobin: 12.5 g/dL (ref 12.0–15.0)
Immature Granulocytes: 1 %
Lymphocytes Relative: 57 %
Lymphs Abs: 5 K/uL — ABNORMAL HIGH (ref 0.7–4.0)
MCH: 26.9 pg (ref 26.0–34.0)
MCHC: 32.6 g/dL (ref 30.0–36.0)
MCV: 82.6 fL (ref 80.0–100.0)
Monocytes Absolute: 0.5 K/uL (ref 0.1–1.0)
Monocytes Relative: 6 %
Neutro Abs: 2.3 K/uL (ref 1.7–7.7)
Neutrophils Relative %: 26 %
Platelets: 182 K/uL (ref 150–400)
RBC: 4.65 MIL/uL (ref 3.87–5.11)
RDW: 14.2 % (ref 11.5–15.5)
WBC: 8.7 K/uL (ref 4.0–10.5)
nRBC: 0.2 % (ref 0.0–0.2)

## 2024-04-14 LAB — GLUCOSE, CAPILLARY
Glucose-Capillary: 101 mg/dL — ABNORMAL HIGH (ref 70–99)
Glucose-Capillary: 101 mg/dL — ABNORMAL HIGH (ref 70–99)
Glucose-Capillary: 104 mg/dL — ABNORMAL HIGH (ref 70–99)
Glucose-Capillary: 105 mg/dL — ABNORMAL HIGH (ref 70–99)
Glucose-Capillary: 105 mg/dL — ABNORMAL HIGH (ref 70–99)
Glucose-Capillary: 105 mg/dL — ABNORMAL HIGH (ref 70–99)
Glucose-Capillary: 114 mg/dL — ABNORMAL HIGH (ref 70–99)
Glucose-Capillary: 116 mg/dL — ABNORMAL HIGH (ref 70–99)
Glucose-Capillary: 138 mg/dL — ABNORMAL HIGH (ref 70–99)
Glucose-Capillary: 139 mg/dL — ABNORMAL HIGH (ref 70–99)
Glucose-Capillary: 150 mg/dL — ABNORMAL HIGH (ref 70–99)
Glucose-Capillary: 193 mg/dL — ABNORMAL HIGH (ref 70–99)
Glucose-Capillary: 271 mg/dL — ABNORMAL HIGH (ref 70–99)
Glucose-Capillary: 352 mg/dL — ABNORMAL HIGH (ref 70–99)
Glucose-Capillary: 82 mg/dL (ref 70–99)
Glucose-Capillary: 88 mg/dL (ref 70–99)
Glucose-Capillary: 97 mg/dL (ref 70–99)
Glucose-Capillary: 99 mg/dL (ref 70–99)

## 2024-04-14 MED ORDER — LEVETIRACETAM 500 MG PO TABS
500.0000 mg | ORAL_TABLET | Freq: Every day | ORAL | Status: DC
  Administered 2024-04-14 – 2024-04-19 (×5): 500 mg via ORAL
  Filled 2024-04-14 (×4): qty 1

## 2024-04-14 MED ORDER — CHLORHEXIDINE GLUCONATE CLOTH 2 % EX PADS
6.0000 | MEDICATED_PAD | Freq: Every day | CUTANEOUS | Status: DC
  Administered 2024-04-15 – 2024-04-16 (×2): 6 via TOPICAL

## 2024-04-14 MED ORDER — SODIUM CHLORIDE 0.9 % IV SOLN
1.0000 g | Freq: Every day | INTRAVENOUS | Status: DC
  Administered 2024-04-14 – 2024-04-18 (×5): 1 g via INTRAVENOUS
  Filled 2024-04-14 (×5): qty 10

## 2024-04-14 MED ORDER — INSULIN ASPART 100 UNIT/ML IJ SOLN
0.0000 [IU] | INTRAMUSCULAR | Status: DC
  Administered 2024-04-15: 21:00:00 1 [IU] via SUBCUTANEOUS
  Administered 2024-04-16: 22:00:00 3 [IU] via SUBCUTANEOUS
  Administered 2024-04-16 (×2): 1 [IU] via SUBCUTANEOUS
  Administered 2024-04-17: 6 [IU] via SUBCUTANEOUS
  Administered 2024-04-17: 1 [IU] via SUBCUTANEOUS
  Administered 2024-04-17: 3 [IU] via SUBCUTANEOUS
  Administered 2024-04-17: 6 [IU] via SUBCUTANEOUS
  Administered 2024-04-18: 2 [IU] via SUBCUTANEOUS
  Administered 2024-04-18: 5 [IU] via SUBCUTANEOUS
  Administered 2024-04-18 – 2024-04-19 (×2): 3 [IU] via SUBCUTANEOUS
  Administered 2024-04-19: 1 [IU] via SUBCUTANEOUS
  Administered 2024-04-19: 2 [IU] via SUBCUTANEOUS
  Filled 2024-04-14: qty 3
  Filled 2024-04-14: qty 6
  Filled 2024-04-14: qty 5
  Filled 2024-04-14 (×2): qty 1
  Filled 2024-04-14: qty 2
  Filled 2024-04-14 (×2): qty 1
  Filled 2024-04-14: qty 3
  Filled 2024-04-14: qty 1
  Filled 2024-04-14 (×3): qty 3
  Filled 2024-04-14: qty 6

## 2024-04-14 MED ORDER — LEVETIRACETAM 250 MG PO TABS
250.0000 mg | ORAL_TABLET | ORAL | Status: DC
  Administered 2024-04-16 – 2024-04-18 (×2): 250 mg via ORAL
  Filled 2024-04-14 (×2): qty 1

## 2024-04-14 MED ORDER — LAMOTRIGINE 25 MG PO TABS
25.0000 mg | ORAL_TABLET | Freq: Every day | ORAL | Status: DC
  Administered 2024-04-14: 22:00:00 25 mg via ORAL

## 2024-04-14 MED ORDER — INSULIN GLARGINE 100 UNIT/ML ~~LOC~~ SOLN
5.0000 [IU] | Freq: Every day | SUBCUTANEOUS | Status: DC
  Administered 2024-04-14 – 2024-04-19 (×6): 5 [IU] via SUBCUTANEOUS
  Filled 2024-04-14 (×6): qty 0.05

## 2024-04-14 NOTE — Inpatient Diabetes Management (Signed)
 Inpatient Diabetes Program Recommendations  AACE/ADA: New Consensus Statement on Inpatient Glycemic Control (2015)  Target Ranges:  Prepandial:   less than 140 mg/dL      Peak postprandial:   less than 180 mg/dL (1-2 hours)      Critically ill patients:  140 - 180 mg/dL   Lab Results  Component Value Date   GLUCAP 101 (H) 04/14/2024   HGBA1C 8.5 (H) 03/30/2024    Review of Glycemic Control  Latest Reference Range & Units 04/14/24 04:16 04/14/24 06:18 04/14/24 08:06 04/14/24 08:40 04/14/24 09:34 04/14/24 10:28  Glucose-Capillary 70 - 99 mg/dL 861 (H) 849 (H) 99 88 894 (H) 101 (H)   Diabetes history: DM 1 Outpatient Diabetes medications:  Lantus  3 units daily Novolog  0-8 units tid with meals Current orders for Inpatient glycemic control:  IV insulin - DKA orders  Inpatient Diabetes Program Recommendations:    Needs updated labs.  When patient is ready for transition, recommend Lantus  5 units daily plus Novolog  0-6 units q 4 hours.  Patient is very sensitive to insulin  doses.  Once eating, may need very low dose meal coverage as well.    -Note patient has had 16 admissions in the past 6 months plus 11 ED visits. Will follow closely.   Thanks,  Randall Bullocks, RN, BC-ADM Inpatient Diabetes Coordinator Pager [phone]  (8a-5p)

## 2024-04-14 NOTE — Consult Note (Signed)
 [facility] Renal Consultation Note    Indication for Consultation:  Management of ESRD/hemodialysis; anemia, hypertension/volume and secondary hyperparathyroidism  ERE:[name], [name], MD  HPI: Ashley Freeman is a 33 y.o. female with ESRD on HD TTS at [facility] ([address] She has a past medical history significant for bipolar, T1DM, heart failure reduced ejection fraction, HTN, and HLD.   Noted she was recently hospitalized for DKA and recurrent ascites. Also noted she left the hospital AMA on 04/08/24. Today, she returns to the ED by EMS after having a witnessed seizure.  Seen and examined patient at bedside. She's sleeping but responding to my questions appropriately. She's on RA and denies SOB, CP, and N/V. Notable labs include: Glucose 1155 (now 716), Na 127, K+ 3.4, Ca 7.9, lactic acid 3.9, and Hgb 13.3. CXR shows suspected asymmetric interstitial edema and small b/l pleural effusions (layering on the R), concerning for PNA. No acute intracranial abnormality. Will continue her dialysis while here. Next HD either tonight or tomorrow morning.  Past Medical History:  Diagnosis Date   Abscess, gluteal, right 08/24/2013   Anemia 02/19/2012   Bartholin's gland abscess 09/19/2013   Bipolar disorder (HCC)    BV (bacterial vaginosis) 11/24/2015   Depression    Diabetes mellitus type I (HCC) 2001   Diagnosed at age 37 ; Type I   Diarrhea 05/30/2016   DKA (diabetic ketoacidoses) 08/19/2013   Also in 2018   ESRD (end stage renal disease) (HCC)    Gonorrhea 08/2011   Treated in 09/2011   HFrEF (heart failure with reduced ejection fraction) (HCC)    a. 2022 Echo: EF 40%; b. 10/2021 Echo: EF 55%; b. 07/2022 MV: No ischemia. EF 31%; c. 08/2022 Echo: EF 35%, mildly dil RV, sev TR.   History of trichomoniasis 05/31/2016   Hyperlipidemia 03/28/2016   Hypertension    NICM (nonischemic cardiomyopathy) (HCC)    Sepsis (HCC) 09/19/2013   Past Surgical  History:  Procedure Laterality Date   A/V FISTULAGRAM Right 06/17/2023   Procedure: A/V Fistulagram;  Surgeon: Marea Selinda RAMAN, MD;  Location: [facility] INVASIVE CV LAB;  Service: Cardiovascular;  Laterality: Right;   A/V SHUNT INTERVENTION N/A 09/25/2023   Procedure: A/V SHUNT INTERVENTION;  Surgeon: Pearline Norman RAMAN, MD;  Location: [facility] PV LAB;  Service: Cardiovascular;  Laterality: N/A;   AV FISTULA PLACEMENT Right 07/06/2022   Procedure: ARTERIOVENOUS GRAFT CREATION;  Surgeon: Gretta Lonni PARAS, MD;  Location: [facility] OR;  Service: Vascular;  Laterality: Right;   CESAREAN SECTION N/A 10/05/2019   Procedure: CESAREAN SECTION;  Surgeon: Izell Harari, MD;  Location: [facility] LD ORS;  Service: Obstetrics;  Laterality: N/A;   CHOLECYSTECTOMY N/A 07/02/2023   Procedure: LAPAROSCOPIC CHOLECYSTECTOMY;  Surgeon: Ebbie Cough, MD;  Location: [facility] OR;  Service: General;  Laterality: N/A;   ESOPHAGOGASTRODUODENOSCOPY N/A 01/31/2024   Procedure: EGD (ESOPHAGOGASTRODUODENOSCOPY);  Surgeon: San Sandor GAILS, DO;  Location: [facility] ENDOSCOPY;  Service: Gastroenterology;  Laterality: N/A;   INCISION AND DRAINAGE ABSCESS Left 09/28/2019   Procedure: INCISION AND DRAINAGE VULVAR ABCESS;  Surgeon: Edsel Norleen GAILS, MD;  Location: [facility] OR;  Service: Gynecology;  Laterality: Left;   INCISION AND DRAINAGE ABSCESS Right 02/23/2024   Procedure: INCISION AND DRAINAGE, ABSCESS;  Surgeon: Tobie Eldora NOVAK, MD;  Location: [facility] OR;  Service: ENT;  Laterality: Right;   INCISION AND DRAINAGE PERIRECTAL ABSCESS Right 08/18/2013   Procedure: IRRIGATION AND DEBRIDEMENT GLUTEAL ABSCESS;  Surgeon: Lynda Leos, MD;  Location: [facility] OR;  Service: General;  Laterality: Right;   INCISION AND DRAINAGE PERIRECTAL ABSCESS Right 09/19/2013   Procedure: IRRIGATION AND DEBRIDEMENT RIGHT GLUTEAL AND LABIAL ABSCESSES;  Surgeon: Lynda Leos, MD;  Location: [facility] OR;  Service: General;  Laterality: Right;   INCISION AND DRAINAGE PERIRECTAL ABSCESS Right  09/24/2013   Procedure: IRRIGATION AND DEBRIDEMENT PERIRECTAL ABSCESS;  Surgeon: Lynwood MALVA Pina, MD;  Location: [facility] OR;  Service: General;  Laterality: Right;   IR PARACENTESIS  08/28/2023   IR PARACENTESIS  11/04/2023   IR PARACENTESIS  12/23/2023   IR PARACENTESIS  02/04/2024   IR PARACENTESIS  03/23/2024   IR PARACENTESIS  04/06/2024   Family History  Problem Relation Age of Onset   Asthma Mother    Carpal tunnel syndrome Mother    Gout Father    Diabetes Paternal Grandmother    Anesthesia problems Neg Hx    Social History:  reports that she has never smoked. She has never been exposed to tobacco smoke. She has never used smokeless tobacco. She reports that she does not currently use alcohol . She reports that she does not use drugs. Allergies[1] Prior to Admission medications  Medication Sig Start Date End Date Taking? Authorizing Provider  albuterol  (VENTOLIN  HFA) 108 (90 Base) MCG/ACT inhaler Inhale 2 puffs into the lungs every 4 (four) hours as needed for wheezing or shortness of breath. 11/24/22   [provider]  bumetanide  (BUMEX ) 2 MG tablet Take 10 mg by mouth daily.    [provider]  carvedilol  (COREG ) 25 MG tablet Take 25 mg by mouth 2 (two) times daily with a meal.    [provider]  Cinnamon 500 MG capsule Take 500 mg by mouth in the morning.    [provider]  DULoxetine  (CYMBALTA ) 20 MG capsule Take 20 mg by mouth daily. 07/29/23   [provider]  Elderberry-Vitamin C-Zinc  (ELDERBERRY IMMUNE HEALTH GUMMY PO) Take 2 each by mouth in the morning.    [provider]  fluticasone  (FLONASE ) 50 MCG/ACT nasal spray Place 2 sprays into both nostrils daily as needed for allergies or rhinitis. 12/11/23 12/10/24  [provider]  hydrOXYzine  (ATARAX ) 25 MG tablet Take 25 mg by mouth 3 (three) times daily as needed for anxiety, itching, nausea or vomiting.    [provider]  lamoTRIgine  (LAMICTAL ) 200 MG tablet Take  1 tablet (200 mg total) by mouth daily. 10/29/23   Regalado, Belkys A, MD  LANTUS  SOLOSTAR 100 UNIT/ML Solostar Pen Inject 3 Units into the skin daily. 04/08/24   Elgergawy, Brayton RAMAN, MD  NOVOLOG  FLEXPEN 100 UNIT/ML FlexPen Before each meal 3 times a day, 140-199 - 2 units, 200-250 - 4 units, 251-299 - 6 units,  300-349 - 7 units,  350 or above 8 units. 03/30/24   Singh, Prashant K, MD  OLANZapine  (ZYPREXA ) 5 MG tablet Take 5 mg by mouth at bedtime.    [provider]  Pancrelipase , Lip-Prot-Amyl, 3000-9500 units CPEP Take 3,000 Units by mouth in the morning, at noon, and at bedtime.    [provider]  pantoprazole  (PROTONIX ) 40 MG tablet Take 1 tablet (40 mg total) by mouth 2 (two) times daily. 02/07/24 04/10/24  Drusilla Sabas RAMAN, MD  prochlorperazine  (COMPAZINE ) 5 MG tablet Take 5 mg by mouth every 6 (six) hours as needed for nausea or vomiting.    [provider]  rosuvastatin  (CRESTOR ) 40 MG tablet Take 40 mg by mouth daily. 01/20/24 01/19/25  [provider]  sacubitril -valsartan  (ENTRESTO ) 24-26 MG Take  1 tablet by mouth 2 (two) times daily.    [provider]  sevelamer  carbonate (RENVELA ) 800 MG tablet Take 1 tablet (800 mg total) by mouth 3 (three) times daily with meals. 02/07/24   Drusilla Sabas RAMAN, MD  sodium bicarbonate  650 MG tablet Take 650 mg by mouth 2 (two) times daily. Patient not taking: Reported on 04/07/2024 07/29/23   [provider]  SUMAtriptan  (IMITREX ) 50 MG tablet Take 50 mg by mouth every 2 (two) hours as needed for migraine or headache. 06/20/22   [provider]  VELTASSA  16.8 g PACK Take 1 Dose by mouth daily at 12 noon. 04/03/24   [provider]   Current Facility-Administered Medications  Medication Dose Route Frequency Provider Last Rate Last Admin   acetaminophen  (TYLENOL ) tablet 650 mg  650 mg Oral Q6H PRN Dena Charleston, MD       Or   acetaminophen  (TYLENOL ) suppository 650 mg  650 mg Rectal Q6H PRN  Dena Charleston, MD       carvedilol  (COREG ) tablet 25 mg  25 mg Oral BID WC Dena Charleston, MD   25 mg at 04/14/24 1546   dextrose  5 % in lactated ringers  infusion   Intravenous Continuous Perri DELENA Meliton Mickey., MD 75 mL/hr at 04/14/24 1259 75 mL/hr at 04/14/24 1259   dextrose  50 % solution 0-50 mL  0-50 mL Intravenous PRN Dena Charleston, MD       DULoxetine  (CYMBALTA ) DR capsule 20 mg  20 mg Oral Daily Dorrell, Robert, MD   20 mg at 04/14/24 0801   heparin  injection 5,000 Units  5,000 Units Subcutaneous Q8H Dorrell, Robert, MD   5,000 Units at 04/14/24 1332   HYDROcodone -acetaminophen  (NORCO/VICODIN) 5-325 MG per tablet 1-2 tablet  1-2 tablet Oral Q4H PRN Dena Charleston, MD   2 tablet at 04/13/24 2002   HYDROmorphone  (DILAUDID ) injection 0.5 mg  0.5 mg Intravenous Q2H PRN Howerter, Justin B, DO   0.5 mg at 04/14/24 9374   hydrOXYzine  (ATARAX ) tablet 25 mg  25 mg Oral TID PRN Howerter, Justin B, DO   25 mg at 04/13/24 2358   insulin  aspart (novoLOG ) injection 0-6 Units  0-6 Units Subcutaneous Q4H Perri DELENA Meliton Mickey., MD       insulin  glargine (LANTUS ) injection 5 Units  5 Units Subcutaneous Daily Perri DELENA Meliton Mickey., MD       insulin  regular, human (MYXREDLIN ) 100 units/ 100 mL infusion   Intravenous Continuous Dorrell, Robert, MD 0.2 mL/hr at 04/14/24 1543 0.2 Units/hr at 04/14/24 1543   labetalol  (NORMODYNE ) injection 10 mg  10 mg Intravenous Q2H PRN Howerter, Justin B, DO   10 mg at 04/14/24 0008   lactated ringers  infusion   Intravenous Continuous Perri DELENA Meliton Mickey., MD   Stopped at 04/14/24 0710   levETIRAcetam  (KEPPRA ) undiluted injection 500 mg  500 mg Intravenous Q12H Dorrell, Robert, MD   500 mg at 04/14/24 0801   OLANZapine  (ZYPREXA ) tablet 5 mg  5 mg Oral QHS Dorrell, Robert, MD   5 mg at 04/13/24 2111   ondansetron  (ZOFRAN ) tablet 4 mg  4 mg Oral Q6H PRN Dena Charleston, MD       Or   ondansetron  (ZOFRAN ) injection 4 mg  4 mg Intravenous Q6H PRN Dena Charleston, MD   4 mg  at 04/14/24 0414   pantoprazole  (PROTONIX ) EC tablet 40 mg  40 mg Oral BID Dorrell, Robert, MD   40 mg at 04/14/24 0801   prochlorperazine  (COMPAZINE ) tablet  5 mg  5 mg Oral Q6H PRN Dena Charleston, MD       rosuvastatin  (CRESTOR ) tablet 40 mg  40 mg Oral Daily Dena Charleston, MD   40 mg at 04/14/24 0801   sacubitril -valsartan  (ENTRESTO ) 24-26 mg per tablet  1 tablet Oral BID Dena Charleston, MD   1 tablet at 04/14/24 0801   Labs: Basic Metabolic Panel: Recent Labs  Lab 04/08/24 0322 04/13/24 1531 04/13/24 1748 04/13/24 1754 04/13/24 2108  NA 139 123* 123* 123* 127*  K 3.8 3.6 4.3 4.1 3.4*  CL 98 88* 91*  --  96*  CO2 29 17* 16*  --  15*  GLUCOSE 50* 1,155* 1,080*  --  716*  BUN 29* 25* 26*  --  26*  CREATININE 6.16* 5.95* 6.04*  --  5.84*  CALCIUM  8.1* 8.2* 7.4*  --  7.9*  PHOS 7.6*  --   --   --   --    Liver Function Tests: Recent Labs  Lab 04/08/24 0322 04/13/24 1531  AST  --  47*  ALT  --  18  ALKPHOS  --  362*  BILITOT  --  1.4*  PROT  --  5.8*  ALBUMIN  2.3* 3.1*   No results for input(s): LIPASE, AMYLASE in the last 168 hours. No results for input(s): AMMONIA in the last 168 hours. CBC: Recent Labs  Lab 04/08/24 0322 04/13/24 1531 04/13/24 1754  WBC 13.4* 7.3  --   NEUTROABS 7.4 4.5  --   HGB 11.5* 11.9* 13.3  HCT 36.9 40.8 39.0  MCV 87.4 91.7  --   PLT 261 178  --    Cardiac Enzymes: No results for input(s): CKTOTAL, CKMB, CKMBINDEX, TROPONINI in the last 168 hours. CBG: Recent Labs  Lab 04/14/24 1132 04/14/24 1227 04/14/24 1331 04/14/24 1442 04/14/24 1540  GLUCAP 104* 105* 105* 116* 114*   Iron  Studies: No results for input(s): IRON , TIBC, TRANSFERRIN, FERRITIN in the last 72 hours. Studies/Results: DG Chest Port 1 View Result Date: 04/13/2024 EXAM: 1 VIEW(S) XRAY OF THE CHEST 04/13/2024 04:21:00 PM COMPARISON: 04/06/2024 CLINICAL HISTORY: cough FINDINGS: LINES, TUBES AND DEVICES: Right axillary stent. LUNGS AND  PLEURA: Asymmetric interstitial/perihilar prominence, right lung predominant, favoring asymmetric interstitial edema over multifocal infection. Superimposed right lower lobe opacity favors pneumonia over atelectasis. Small bilateral pleural effusions, layering on the right. No pneumothorax. HEART AND MEDIASTINUM: Mild cardiomegaly is improved. BONES AND SOFT TISSUES: No acute osseous abnormality. IMPRESSION: 1. Suspected asymmetric interstitial edema, right lung predominant, less likely multifocal infection. 2. Superimposed right lower lobe opacity, favoring pneumonia over atelectasis. 3. Small bilateral pleural effusions, layering on the right. Electronically signed by: Pinkie Pebbles MD 04/13/2024 05:04 PM EST RP Workstation: [id number]   CT Head Wo Contrast Result Date: 04/13/2024 EXAM: CT HEAD WITHOUT 04/13/2024 04:33:32 PM TECHNIQUE: CT of the head was performed without the administration of intravenous contrast. Automated exposure control, iterative reconstruction, and/or weight based adjustment of the mA/kV was utilized to reduce the radiation dose to as low as reasonably achievable. COMPARISON: 11/11/2023 CLINICAL HISTORY: Seizure, new-onset, no history of trauma FINDINGS: BRAIN AND VENTRICLES: No acute intracranial hemorrhage. No mass effect or midline shift. No extra-axial fluid collection. No evidence of acute infarct. No hydrocephalus. Atherosclerosis of the carotid siphons and intracranial vertebral arteries. ORBITS: Bilateral lens replacement. SINUSES AND MASTOIDS: Mild mucosal thickening in the paranasal sinuses. SOFT TISSUES AND SKULL: No acute skull fracture. There is diffuse stranding of the facial subcutaneous fat bilaterally which could reflect edema  or anasarca. IMPRESSION: 1. No acute intracranial abnormality. 2. Skull base atherosclerosis greater than expected for age. 3. There is diffuse stranding of the facial subcutaneous fat bilaterally which could reflect edema/anasarca.  Electronically signed by: Donnice Mania MD 04/13/2024 04:42 PM EST RP Workstation: [id number]    ROS: All others negative except those listed in HPI.   Physical Exam: Vitals:   04/13/24 2349 04/14/24 0402 04/14/24 0737 04/14/24 1145  BP: (!) 197/120 (!) 164/91 (!) 163/90 (!) 151/79  Pulse: 81 87 82 77  Resp:   16 15  Temp: 98 F (36.7 C) 98 F (36.7 C) 98.6 F (37 C) 98.4 F (36.9 C)  TempSrc: Oral Oral    SpO2: 97% 100% 100% 98%  Weight:      Height:         General: WDWN NAD Head: (+) facial edema; sclera not icteric Lungs: CTA bilaterally. No wheeze, rales or rhonchi. Breathing is unlabored. Heart: RRR. No murmur, rubs or gallops.  Abdomen: soft, nontender, +BS, no guarding, no rebound tenderness Lower extremities: no bilateral lower extremities Neuro: AAOx3. Moves all extremities spontaneously. Dialysis Access: R AVF  Dialysis Orders:  [facility] ([address] [phone]) *I requested outside records*  Assessment/Plan: Witnessed seizure - likely 2nd uncontrolled DM; neurology consulted; on Keppra . T1DM/DKA - Per primary Possible PNA - On Levaquin  for now, per primary ESRD - on HD TTS. Next HD either tonight or tomorrow morning. Hypertension/volume  - Noted facial edema and CXR showed interstitial edema and pleural effusions. She's not in distress. See above on HD plan Anemia of CKD - Hgb 13.3; Fe/ESA is not indicated at this time Secondary Hyperparathyroidism - Checking phos in AM Bipolar - On Cymbalta  and Zyprexa  Nutrition - Renal diet with fluid restriction  Charmaine Piety, NP [facility] 04/14/2024, 3:59 PM       [1]  Allergies Allergen Reactions   Keflex  [Cephalexin ] Anaphylaxis    Ceftriaxone  in the past with no reaction   Penicillins Anaphylaxis, Hives and Rash   Vibramycin  [Doxycycline ] Anaphylaxis   Benadryl  [Diphenhydramine ] Itching   Dilaudid  [Hydromorphone ] Itching   Methotrexate And Trimetrexate Rash    Roxicodone  [Oxycodone ] Itching    Takes Percocet without issue

## 2024-04-14 NOTE — Plan of Care (Signed)
°  Problem: Clinical Measurements: Goal: Respiratory complications will improve Outcome: Progressing Goal: Cardiovascular complication will be avoided Outcome: Progressing   Problem: Elimination: Goal: Will not experience complications related to bowel motility Outcome: Progressing   Problem: Pain Managment: Goal: General experience of comfort will improve and/or be controlled Outcome: Progressing   Problem: Safety: Goal: Ability to remain free from injury will improve Outcome: Progressing   Problem: Skin Integrity: Goal: Risk for impaired skin integrity will decrease Outcome: Progressing   Problem: Education: Goal: Knowledge of General Education information will improve Description: Including pain rating scale, medication(s)/side effects and non-pharmacologic comfort measures Outcome: Not Progressing   Problem: Health Behavior/Discharge Planning: Goal: Ability to manage health-related needs will improve Outcome: Not Progressing   Problem: Clinical Measurements: Goal: Ability to maintain clinical measurements within normal limits will improve Outcome: Not Progressing   Problem: Activity: Goal: Risk for activity intolerance will decrease Outcome: Not Progressing   Problem: Nutrition: Goal: Adequate nutrition will be maintained Outcome: Not Progressing   Problem: Coping: Goal: Level of anxiety will decrease Outcome: Not Progressing

## 2024-04-14 NOTE — Progress Notes (Signed)
 Pharmacy Antibiotic Note  Ashley Freeman is a 33 y.o. female admitted on 04/13/2024 with pneumonia.  Pharmacy has been consulted for levafloxacin > ceftriaxone  dosing.  Confirmed with MD, ok to change levofloxacin  to ceftriaxone  for CAP treatment (has tolerated ceftriaxone  on several occassions with no issues per chart review). Has also tolerated cefazolin .  Plan: Ceftriaxone  1g Q24h  Trend WBC, fever, renal function F/u cultures, clinical progress, levels as indicated De-escalate when able   Height: 5' 3 (160 cm) Weight: 59 kg (130 lb) IBW/kg (Calculated) : 52.4  Temp (24hrs), Avg:98.3 F (36.8 C), Min:98 F (36.7 C), Max:98.6 F (37 C)  Recent Labs  Lab 04/08/24 0322 04/13/24 1531 04/13/24 1748 04/13/24 1754 04/13/24 2030 04/13/24 2108  WBC 13.4* 7.3  --   --   --   --   CREATININE 6.16* 5.95* 6.04*  --   --  5.84*  LATICACIDVEN  --   --   --  3.3* 3.9*  --     Estimated Creatinine Clearance: 11.5 mL/min (A) (by C-G formula based on SCr of 5.84 mg/dL (H)).    Allergies[1]   Thank you for allowing pharmacy to be a part of this patients care.  Shelba Collier, PharmD, BCPS Clinical Pharmacist    [1]  Allergies Allergen Reactions   Keflex  [Cephalexin ] Anaphylaxis    Ceftriaxone  in the past with no reaction   Penicillins Anaphylaxis, Hives and Rash   Vibramycin  [Doxycycline ] Anaphylaxis   Benadryl  [Diphenhydramine ] Itching   Dilaudid  [Hydromorphone ] Itching   Methotrexate And Trimetrexate Rash   Roxicodone  [Oxycodone ] Itching    Takes Percocet without issue

## 2024-04-14 NOTE — Progress Notes (Signed)
 [facility] night cross cover note:   I was notified by the patient's RN of the patient's improving blood sugar, now with CBG result of 139 and this patient admitted with DKA and currently on insulin  drip via Endo tool.  Currently on D5 LR.  Most recent BMP shows bicarbonate 15, anion gap 16.  Most recent beta-hydroxybutyrate acid level 1.84. will continue DKA protocol, insulin  drip, D5 LR, and monitor for closure of anion gap metabolic acidosis and improvement in beta-hydroxybutyrate acid level via every 4 hour BMPs.  Have also ordered an additional beta-hydroxybutyrate acid level to be checked with this morning's labs.     Eva Pore, DO Hospitalist

## 2024-04-14 NOTE — Consult Note (Addendum)
 NEUROLOGY CONSULT NOTE   Date of service: April 14, 2024 Patient Name: Ashley Freeman MRN:  [id number] DOB:  June 09, 1990 Chief Complaint: seizures Requesting Provider: Perri DELENA Meliton Mickey., *  History of Present Illness  Ashley Freeman is a 33 y.o. female with hx of DM Type 1, seizres, Bipolar Disorder, ESRD, CHFrEF, HTN, HLD who presented to ED after witnessed seizure. Patients blood glucose on arrival was 1155. She has now been transitioned off insulin  pump.  Patient was just discharged from the hospital on 12/10 after being admitted with DKA and recurrent ascites.  Chart review shows several prior admissions for both hyperglycemia as well as hypoglycemia. Some of these have triggered seizures. She was therefore started on lamotrigine .  On exam today, she is drowsy but oriented, not fully cooperative with exam. She says she hurts all over. No confusion, focal or sensory deficits. No staring off, rhythmic jerking or seizure-like activity on exam. Patient endorses sticking to her prescribed insulin  regimen and denies any issues getting this medication.   No recent fills of Lamictal  on her chart. Dr. Perri noted that he reviewed patient's prescriptions with [facility] and it hasn't been filled since the summer. Psych was consulted for med recs due to possible medication non-compliance with Lamictal . While speaking with patient, she confirmed that she hasn't had her medicine for at least 2- 3 months because she did not have refills and forgot to ask the doctor for them. She denies any specific side effects or intolerance to lamotrigine . Educated patient on need for this medication and dangers of stopping it abruptly.   ROS  Comprehensive ROS performed and pertinent positives documented in HPI   Past History   Past Medical History:  Diagnosis Date   Abscess, gluteal, right 08/24/2013   Anemia 02/19/2012   Bartholin's gland abscess 09/19/2013   Bipolar disorder  (HCC)    BV (bacterial vaginosis) 11/24/2015   Depression    Diabetes mellitus type I (HCC) 2001   Diagnosed at age 65 ; Type I   Diarrhea 05/30/2016   DKA (diabetic ketoacidoses) 08/19/2013   Also in 2018   ESRD (end stage renal disease) (HCC)    Gonorrhea 08/2011   Treated in 09/2011   HFrEF (heart failure with reduced ejection fraction) (HCC)    a. 2022 Echo: EF 40%; b. 10/2021 Echo: EF 55%; b. 07/2022 MV: No ischemia. EF 31%; c. 08/2022 Echo: EF 35%, mildly dil RV, sev TR.   History of trichomoniasis 05/31/2016   Hyperlipidemia 03/28/2016   Hypertension    NICM (nonischemic cardiomyopathy) (HCC)    Sepsis (HCC) 09/19/2013    Past Surgical History:  Procedure Laterality Date   A/V FISTULAGRAM Right 06/17/2023   Procedure: A/V Fistulagram;  Surgeon: Marea Selinda GORMAN, MD;  Location: [facility] INVASIVE CV LAB;  Service: Cardiovascular;  Laterality: Right;   A/V SHUNT INTERVENTION N/A 09/25/2023   Procedure: A/V SHUNT INTERVENTION;  Surgeon: Pearline Norman GORMAN, MD;  Location: [facility] PV LAB;  Service: Cardiovascular;  Laterality: N/A;   AV FISTULA PLACEMENT Right 07/06/2022   Procedure: ARTERIOVENOUS GRAFT CREATION;  Surgeon: Gretta Lonni PARAS, MD;  Location: [facility] OR;  Service: Vascular;  Laterality: Right;   CESAREAN SECTION N/A 10/05/2019   Procedure: CESAREAN SECTION;  Surgeon: Izell Harari, MD;  Location: [facility] LD ORS;  Service: Obstetrics;  Laterality: N/A;   CHOLECYSTECTOMY N/A 07/02/2023   Procedure: LAPAROSCOPIC CHOLECYSTECTOMY;  Surgeon: Ebbie Cough, MD;  Location: [facility] OR;  Service: General;  Laterality: N/A;   ESOPHAGOGASTRODUODENOSCOPY N/A  01/31/2024   Procedure: EGD (ESOPHAGOGASTRODUODENOSCOPY);  Surgeon: San Sandor GAILS, DO;  Location: [facility] ENDOSCOPY;  Service: Gastroenterology;  Laterality: N/A;   INCISION AND DRAINAGE ABSCESS Left 09/28/2019   Procedure: INCISION AND DRAINAGE VULVAR ABCESS;  Surgeon: Edsel Norleen GAILS, MD;  Location: [facility] OR;  Service: Gynecology;  Laterality: Left;    INCISION AND DRAINAGE ABSCESS Right 02/23/2024   Procedure: INCISION AND DRAINAGE, ABSCESS;  Surgeon: Tobie Eldora NOVAK, MD;  Location: [facility] OR;  Service: ENT;  Laterality: Right;   INCISION AND DRAINAGE PERIRECTAL ABSCESS Right 08/18/2013   Procedure: IRRIGATION AND DEBRIDEMENT GLUTEAL ABSCESS;  Surgeon: Lynda Leos, MD;  Location: [facility] OR;  Service: General;  Laterality: Right;   INCISION AND DRAINAGE PERIRECTAL ABSCESS Right 09/19/2013   Procedure: IRRIGATION AND DEBRIDEMENT RIGHT GLUTEAL AND LABIAL ABSCESSES;  Surgeon: Lynda Leos, MD;  Location: [facility] OR;  Service: General;  Laterality: Right;   INCISION AND DRAINAGE PERIRECTAL ABSCESS Right 09/24/2013   Procedure: IRRIGATION AND DEBRIDEMENT PERIRECTAL ABSCESS;  Surgeon: Lynwood MALVA Pina, MD;  Location: [facility] OR;  Service: General;  Laterality: Right;   IR PARACENTESIS  08/28/2023   IR PARACENTESIS  11/04/2023   IR PARACENTESIS  12/23/2023   IR PARACENTESIS  02/04/2024   IR PARACENTESIS  03/23/2024   IR PARACENTESIS  04/06/2024    Family History: Family History  Problem Relation Age of Onset   Asthma Mother    Carpal tunnel syndrome Mother    Gout Father    Diabetes Paternal Grandmother    Anesthesia problems Neg Hx     Social History  reports that she has never smoked. She has never been exposed to tobacco smoke. She has never used smokeless tobacco. She reports that she does not currently use alcohol . She reports that she does not use drugs.  Allergies[1]  Medications  Current Medications[2]  Vitals   Vitals:   04/14/24 0402 04/14/24 0737 04/14/24 1145 04/14/24 1607  BP: (!) 164/91 (!) 163/90 (!) 151/79 (!) 151/88  Pulse: 87 82 77 74  Resp:  16 15 16   Temp: 98 F (36.7 C) 98.6 F (37 C) 98.4 F (36.9 C) 98.5 F (36.9 C)  TempSrc: Oral     SpO2: 100% 100% 98% 94%  Weight:      Height:        Body mass index is 23.03 kg/m.   Physical Exam   Constitutional: Appears well-developed and well-nourished.  Psych: Flat  affect Cardiovascular: Normal rate and regular rhythm.  Respiratory: Effort normal, non-labored breathing.   Neurologic Examination   Neuro: Mental Status: Patient is drowsy, oriented to person, place, time, and situation. Patient is able to give a clear and coherent history. No signs of aphasia or neglect Cranial Nerves: II: Visual Fields are full. PERRL  III,IV, VI: EOMI  V: Facial sensation is symmetric to light touch VII: Facial movement is symmetric.  VIII: hearing is intact to voice X: No dysarthria.  XI: Head turn is symmetric. XII: not cooperative with tongue protrusion Motor: Tone is normal. Bulk is normal.Generalized weakness.  Sensory: Patient not cooperative.  Cerebellar: Patient not cooperative   Labs/Imaging/Neurodiagnostic studies   CBC:  Recent Labs  Lab 2024-04-17 0322 04/13/24 1531 04/13/24 1754  WBC 13.4* 7.3  --   NEUTROABS 7.4 4.5  --   HGB 11.5* 11.9* 13.3  HCT 36.9 40.8 39.0  MCV 87.4 91.7  --   PLT 261 178  --    Basic Metabolic Panel:  Lab Results  Component Value Date  NA 127 (L) 04/13/2024   K 3.4 (L) 04/13/2024   CO2 15 (L) 04/13/2024   GLUCOSE 716 (HH) 04/13/2024   BUN 26 (H) 04/13/2024   CREATININE 5.84 (H) 04/13/2024   CALCIUM  7.9 (L) 04/13/2024   GFRNONAA 9 (L) 04/13/2024   GFRAA 44 (L) 01/30/2020   Lipid Panel:  Lab Results  Component Value Date   LDLCALC 149 (H) 05/24/2021   HgbA1c:  Lab Results  Component Value Date   HGBA1C 8.5 (H) 03/30/2024   Urine Drug Screen:     Component Value Date/Time   LABOPIA NONE DETECTED 09/07/2022 1840   LABOPIA NONE DETECTED 04/24/2021 1032   COCAINSCRNUR NONE DETECTED 09/07/2022 1840   COCAINSCRNUR NEGATIVE 12/18/2009 1453   LABBENZ NONE DETECTED 09/07/2022 1840   LABBENZ POSITIVE (A) 04/24/2021 1032   LABBENZ NEGATIVE 12/18/2009 1453   AMPHETMU NONE DETECTED 09/07/2022 1840   AMPHETMU NONE DETECTED 04/24/2021 1032   THCU NONE DETECTED 09/07/2022 1840   THCU NONE DETECTED  04/24/2021 1032   LABBARB NONE DETECTED 09/07/2022 1840   LABBARB NONE DETECTED 04/24/2021 1032    Alcohol  Level     Component Value Date/Time   ETH <15 03/22/2024 0331   INR  Lab Results  Component Value Date   INR 1.5 (H) 04/06/2024   APTT  Lab Results  Component Value Date   APTT 26 04/24/2021   AED levels:  Lab Results  Component Value Date   LAMOTRIGINE  <1.0 (L) 02/20/2024    CT Head without contrast(Personally reviewed): No acute intracranial abnormality. Skull base atherosclerosis greater than expected for age. There is diffuse stranding of the facial subcutaneous fat bilaterally which could reflect edema/anasarca.  Neurodiagnostics rEEG:  pending  ASSESSMENT   Marai Teehan [place]-Baldwin is a 33 y.o. female with hx of DM Type 1, seizres, Bipolar Disorder, ESRD, CHFrEF, HTN, HLD who presented to ED after witnessed seizure. Patients blood glucose on arrival was 1155. Blood glucose improved with insulin  pump and she has now been transitioned off insulin  pump.  Chart review shows several prior admissions for both hyperglycemia as well as hypoglycemia. Some of these have triggered seizures. She was therefore started on lamotrigine .  She does not have primary epilepsy but I think with continued wild fluctuations in her blood glucose that have led to multiple admissions for DKA, some of which have triggered seizures, I think it is reasonable to use lamotrigine  for seizure prophylaxis. If in the future, her blood glucose is better controlled and she is no longer experiencing wild fluctuations in blood glucose, we should consider weaning her off lamotrigine .  She has been off lamotrgine for the last couple of months after she ran out of it and never got it refilled. She denies any side effects or intolerance to lamotrigine . She will need gradual uptitration of Lamotrigine .  RECOMMENDATIONS  - lamotrigine  titration as below. Lamotrigine  Morning Evening  Week 1 25mg     Week 2 25mg    Week 3 50mg    Week 4 50mg    Week 5 100mg    Week 6 100mg  25mg   Week 7 100mg  50mg   Week 8 100mg  100mg   - routine EEG - Keppra  500mg  daily and 250mg  on TTS after HD. Stop Keppra  after week 6 of lamotrigine  uptitration. - Neurology will signoff. Please feel free to contact us  with any questions or concerns. ______________________________________________________________________  Plan discussed with patient at the bedside. Plan also discussed with pharmacy to help me order this titration.  Robbie Nangle [facility]     [  1]  Allergies Allergen Reactions   Keflex  [Cephalexin ] Anaphylaxis    Ceftriaxone  in the past with no reaction   Penicillins Anaphylaxis, Hives and Rash   Vibramycin  [Doxycycline ] Anaphylaxis   Benadryl  [Diphenhydramine ] Itching   Dilaudid  [Hydromorphone ] Itching   Methotrexate And Trimetrexate Rash   Roxicodone  [Oxycodone ] Itching    Takes Percocet without issue  [2]  Current Facility-Administered Medications:    acetaminophen  (TYLENOL ) tablet 650 mg, 650 mg, Oral, Q6H PRN, 650 mg at 04/14/24 1715 **OR** acetaminophen  (TYLENOL ) suppository 650 mg, 650 mg, Rectal, Q6H PRN, Dena Charleston, MD   carvedilol  (COREG ) tablet 25 mg, 25 mg, Oral, BID WC, Dorrell, Robert, MD, 25 mg at 04/14/24 1546   cefTRIAXone  (ROCEPHIN ) 1 g in sodium chloride  0.9 % 100 mL IVPB, 1 g, Intravenous, QHS, Perri DELENA Meliton Mickey., MD, Last Rate: 200 mL/hr at 04/14/24 1727, 1 g at 04/14/24 1727   [START ON 04/15/2024] Chlorhexidine  Gluconate Cloth 2 % PADS 6 each, 6 each, Topical, Q0600, Lenon Charmaine BRAVO, NP   dextrose  50 % solution 0-50 mL, 0-50 mL, Intravenous, PRN, Dorrell, Robert, MD   DULoxetine  (CYMBALTA ) DR capsule 20 mg, 20 mg, Oral, Daily, Dorrell, Robert, MD, 20 mg at 04/14/24 0801   heparin  injection 5,000 Units, 5,000 Units, Subcutaneous, Q8H, Dorrell, Robert, MD, 5,000 Units at 04/14/24 1332   HYDROcodone -acetaminophen  (NORCO/VICODIN) 5-325 MG per  tablet 1-2 tablet, 1-2 tablet, Oral, Q4H PRN, Dena Charleston, MD, 2 tablet at 04/13/24 2002   HYDROmorphone  (DILAUDID ) injection 0.5 mg, 0.5 mg, Intravenous, Q2H PRN, Howerter, Justin B, DO, 0.5 mg at 04/14/24 9374   hydrOXYzine  (ATARAX ) tablet 25 mg, 25 mg, Oral, TID PRN, Howerter, Justin B, DO, 25 mg at 04/13/24 2358   insulin  aspart (novoLOG ) injection 0-6 Units, 0-6 Units, Subcutaneous, Q4H, Powell, A Meliton Mickey., MD   insulin  glargine (LANTUS ) injection 5 Units, 5 Units, Subcutaneous, Daily, Perri DELENA Meliton Mickey., MD, 5 Units at 04/14/24 1715   insulin  regular, human (MYXREDLIN ) 100 units/ 100 mL infusion, , Intravenous, Continuous, Dorrell, Robert, MD, Held at 04/14/24 1639   labetalol  (NORMODYNE ) injection 10 mg, 10 mg, Intravenous, Q2H PRN, Howerter, Justin B, DO, 10 mg at 04/14/24 0008   levETIRAcetam  (KEPPRA ) undiluted injection 500 mg, 500 mg, Intravenous, Q12H, Dorrell, Robert, MD, 500 mg at 04/14/24 0801   OLANZapine  (ZYPREXA ) tablet 5 mg, 5 mg, Oral, QHS, Dorrell, Robert, MD, 5 mg at 04/13/24 2111   ondansetron  (ZOFRAN ) tablet 4 mg, 4 mg, Oral, Q6H PRN **OR** ondansetron  (ZOFRAN ) injection 4 mg, 4 mg, Intravenous, Q6H PRN, Dorrell, Robert, MD, 4 mg at 04/14/24 0414   pantoprazole  (PROTONIX ) EC tablet 40 mg, 40 mg, Oral, BID, Dorrell, Robert, MD, 40 mg at 04/14/24 0801   prochlorperazine  (COMPAZINE ) tablet 5 mg, 5 mg, Oral, Q6H PRN, Dorrell, Robert, MD   rosuvastatin  (CRESTOR ) tablet 40 mg, 40 mg, Oral, Daily, Dorrell, Robert, MD, 40 mg at 04/14/24 0801   sacubitril -valsartan  (ENTRESTO ) 24-26 mg per tablet, 1 tablet, Oral, BID, Dena Charleston, MD, 1 tablet at 04/14/24 [phone]

## 2024-04-14 NOTE — Progress Notes (Signed)
 Contacted by pt's out-pt HD clinic [facility]) to confirm pt is hospitalized per family and to make navigator aware of pt's admission. Clinic states pt came to HD treatment on Sat, Pt receives HD on TTS 11:45 am chair time at above clinic. Contacted nephrologist and renal NP to inquire if consult has been received on pt and that pt's clinic reached out to navigator. Will assist as needed.   Randine Mungo Dialysis Navigator [phone]

## 2024-04-14 NOTE — Progress Notes (Addendum)
 PROGRESS NOTE    [name]  FMW:[id number] DOB: 1990-08-28 DOA: 04/13/2024 PCP: Keven Crumbly Pap, MD  Chief Complaint  Patient presents with   Seizures    Brief Narrative:   Ashley Freeman is Ashley Freeman 33 y.o. female with medical history significant of bipolar, type 1 diabetes, ESRD, heart failure reduced ejection fraction, hypertension, hyperlipidemia who Emergency Department after witnessed seizure.  Patient was just discharged from the hospital on 12/10 after being admitted with DKA and recurrent ascites.   She's been admitted in DKA.    Assessment & Plan:   Principal Problem:   Seizure (HCC)  DKA Unclear precipitating cause - she notes compliance with her insulin  regimen CXR with findings concerning for RLL pneumonia Blood cultures pending UA pending Issues with blood draws to follow AG, beta hydroxybutyrate, metabolic acidosis - BG's have been well controlled for several hours.  Will go ahead and transition cautiously.    Seizure Disorder In setting of above Sounds like she's had seizures before  Head CT without acute abnormalities Will consult neurology Continue keppra   Per [place]  DMV statutes, patients with seizures are not allowed to drive until  they have been seizure-free for six months. Use caution when using heavy equipment or power tools. Avoid working on ladders or at heights. Take showers instead of baths. Ensure the water  temperature is not too high on the home water  heater. Do not go swimming alone. When caring for infants or small children, sit down when holding, feeding, or changing them to minimize risk of injury to the child in the event you have [name] seizure. Also, Maintain good sleep hygiene. Avoid alcohol .  Possible Pneumonia Treat with levaquin  (due to allergies) Urine strep, urine legionella, blood culture above  ESRD Renal c/s for dialysis  Hypertension Coreg  Holding bumex  for  now  HFrEF Entresto  Coreg  Bumex  on hold Volume per renal   Bipolar Disorder Cymbalta  zyprexa  Lamictal  - no recent fills of lamictal  per med rec - will review with [name] as she says she's taking - per my discussion with [facility], hasn't filled since summer.  Will consult psych for med recs as lamictal  may not be the best choice with her nonadherence.   Dyslipidemia Crestor     DVT prophylaxis: heparin  Code Status: full Family Communication: none Disposition:   Status is: Inpatient Remains inpatient appropriate because: need for continued inpatient care   Consultants:  Renal   Procedures:  none  Antimicrobials:  Anti-infectives (From admission, onward)    None       Subjective: No new complaints  Objective: Vitals:   04/13/24 2349 04/14/24 0402 04/14/24 0737 04/14/24 1145  BP: (!) 197/120 (!) 164/91 (!) 163/90 (!) 151/79  Pulse: 81 87 82 77  Resp:   16 15  Temp: 98 F (36.7 C) 98 F (36.7 C) 98.6 F (37 C) 98.4 F (36.9 C)  TempSrc: Oral Oral    SpO2: 97% 100% 100% 98%  Weight:      Height:        Intake/Output Summary (Last 24 hours) at 04/14/2024 1549 Last data filed at 04/13/2024 1919 Gross per 24 hour  Intake 1281.66 ml  Output --  Net 1281.66 ml   Filed Weights   04/13/24 1457  Weight: 59 kg    Examination:  General exam: Appears calm and comfortable  Respiratory system: Clear to auscultation. Respiratory effort normal. Cardiovascular system: RRR Gastrointestinal system: Abdomen is nondistended, soft and nontender.  Central nervous system: drowsy, moving all extremities -  awakens and answer questions appropriately Extremities: no LEE    Data Reviewed: I have personally reviewed following labs and imaging studies  CBC: Recent Labs  Lab 04/08/24 0322 04/13/24 1531 04/13/24 1754  WBC 13.4* 7.3  --   NEUTROABS 7.4 4.5  --   HGB 11.5* 11.9* 13.3  HCT 36.9 40.8 39.0  MCV 87.4 91.7  --   PLT 261 178  --     Basic  Metabolic Panel: Recent Labs  Lab 04/08/24 0322 04/13/24 1531 04/13/24 1748 04/13/24 1754 04/13/24 2108  NA 139 123* 123* 123* 127*  K 3.8 3.6 4.3 4.1 3.4*  CL 98 88* 91*  --  96*  CO2 29 17* 16*  --  15*  GLUCOSE 50* 1,155* 1,080*  --  716*  BUN 29* 25* 26*  --  26*  CREATININE 6.16* 5.95* 6.04*  --  5.84*  CALCIUM  8.1* 8.2* 7.4*  --  7.9*  MG  --  2.1  --   --   --   PHOS 7.6*  --   --   --   --     GFR: Estimated Creatinine Clearance: 11.5 mL/min ([name]) (by C-G formula based on SCr of 5.84 mg/dL (H)).  Liver Function Tests: Recent Labs  Lab 04/08/24 0322 04/13/24 1531  AST  --  47*  ALT  --  18  ALKPHOS  --  362*  BILITOT  --  1.4*  PROT  --  5.8*  ALBUMIN  2.3* 3.1*    CBG: Recent Labs  Lab 04/14/24 1132 04/14/24 1227 04/14/24 1331 04/14/24 1442 04/14/24 1540  GLUCAP 104* 105* 105* 116* 114*     Recent Results (from the past 240 hours)  Blood Culture (routine single)     Status: None   Collection Time: 04/06/24  8:13 AM   Specimen: BLOOD LEFT ARM  Result Value Ref Range Status   Specimen Description BLOOD LEFT ARM  Final   Special Requests   Final    BOTTLES DRAWN AEROBIC AND ANAEROBIC Blood Culture adequate volume   Culture   Final    NO GROWTH 5 DAYS Performed at [facility], [address], [place], [place] [address]    Report Status 04/11/2024 FINAL  Final  Body fluid culture w Gram Stain     Status: None   Collection Time: 04/06/24 11:22 AM   Specimen: Abdomen; Peritoneal Fluid  Result Value Ref Range Status   Specimen Description PERITONEAL  Final   Special Requests ABDOMEN  Final   Gram Stain NO WBC SEEN NO ORGANISMS SEEN   Final   Culture   Final    NO GROWTH 3 DAYS Performed at [facility], [address], [place], [place] [address]    Report Status 04/09/2024 FINAL  Final  Blood culture (routine x 2)     Status: None (Preliminary result)   Collection Time: 04/13/24  5:46 PM   Specimen: BLOOD LEFT HAND  Result Value  Ref Range Status   Specimen Description BLOOD LEFT HAND  Final   Special Requests   Final    BOTTLES DRAWN AEROBIC AND ANAEROBIC Blood Culture results may not be optimal due to an inadequate volume of blood received in culture bottles   Culture   Final    NO GROWTH < 24 HOURS Performed at [facility], [address], [place], [place] [address]    Report Status PENDING  Incomplete  Radiology Studies: DG Chest Port 1 View Result Date: 04/13/2024 EXAM: 1 VIEW(S) XRAY OF THE CHEST 04/13/2024 04:21:00 PM COMPARISON: 04/06/2024 CLINICAL HISTORY: cough FINDINGS: LINES, TUBES AND DEVICES: Right axillary stent. LUNGS AND PLEURA: Asymmetric interstitial/perihilar prominence, right lung predominant, favoring asymmetric interstitial edema over multifocal infection. Superimposed right lower lobe opacity favors pneumonia over atelectasis. Small bilateral pleural effusions, layering on the right. No pneumothorax. HEART AND MEDIASTINUM: Mild cardiomegaly is improved. BONES AND SOFT TISSUES: No acute osseous abnormality. IMPRESSION: 1. Suspected asymmetric interstitial edema, right lung predominant, less likely multifocal infection. 2. Superimposed right lower lobe opacity, favoring pneumonia over atelectasis. 3. Small bilateral pleural effusions, layering on the right. Electronically signed by: Pinkie Pebbles MD 04/13/2024 05:04 PM EST RP Workstation: [id number]   CT Head Wo Contrast Result Date: 04/13/2024 EXAM: CT HEAD WITHOUT 04/13/2024 04:33:32 PM TECHNIQUE: CT of the head was performed without the administration of intravenous contrast. Automated exposure control, iterative reconstruction, and/or weight based adjustment of the mA/kV was utilized to reduce the radiation dose to as low as reasonably achievable. COMPARISON: 11/11/2023 CLINICAL HISTORY: Seizure, new-onset, no history of trauma FINDINGS: BRAIN AND VENTRICLES: No acute intracranial hemorrhage. No mass effect or midline shift. No  extra-axial fluid collection. No evidence of acute infarct. No hydrocephalus. Atherosclerosis of the carotid siphons and intracranial vertebral arteries. ORBITS: Bilateral lens replacement. SINUSES AND MASTOIDS: Mild mucosal thickening in the paranasal sinuses. SOFT TISSUES AND SKULL: No acute skull fracture. There is diffuse stranding of the facial subcutaneous fat bilaterally which could reflect edema or anasarca. IMPRESSION: 1. No acute intracranial abnormality. 2. Skull base atherosclerosis greater than expected for age. 3. There is diffuse stranding of the facial subcutaneous fat bilaterally which could reflect edema/anasarca. Electronically signed by: Donnice Mania MD 04/13/2024 04:42 PM EST RP Workstation: [id number]        Scheduled Meds:  carvedilol   25 mg Oral BID WC   DULoxetine   20 mg Oral Daily   heparin  injection (subcutaneous)  5,000 Units Subcutaneous Q8H   insulin  aspart  0-6 Units Subcutaneous Q4H   insulin  glargine  5 Units Subcutaneous Daily   levETIRAcetam   500 mg Intravenous Q12H   OLANZapine   5 mg Oral QHS   pantoprazole   40 mg Oral BID   rosuvastatin   40 mg Oral Daily   sacubitril -valsartan   1 tablet Oral BID   Continuous Infusions:  dextrose  5% lactated ringers  75 mL/hr (04/14/24 1259)   insulin  0.2 Units/hr (04/14/24 1543)   lactated ringers  Stopped (04/14/24 0710)     LOS: 1 day    Time spent: over 30 min    Meliton Monte, MD [facility]   To contact the attending provider between 7A-7P or the covering provider during after hours 7P-7A, please log into the web site [email or web address] and access using universal Patrick password for that web site. If you do not have the password, please call the hospital operator.  04/14/2024, 3:49 PM

## 2024-04-15 ENCOUNTER — Inpatient Hospital Stay (HOSPITAL_COMMUNITY)

## 2024-04-15 DIAGNOSIS — R569 Unspecified convulsions: Secondary | ICD-10-CM | POA: Diagnosis not present

## 2024-04-15 LAB — COMPREHENSIVE METABOLIC PANEL WITH GFR
ALT: 66 U/L — ABNORMAL HIGH (ref 0–44)
ALT: 88 U/L — ABNORMAL HIGH (ref 0–44)
AST: 258 U/L — ABNORMAL HIGH (ref 15–41)
AST: 320 U/L — ABNORMAL HIGH (ref 15–41)
Albumin: 2.6 g/dL — ABNORMAL LOW (ref 3.5–5.0)
Albumin: 2.6 g/dL — ABNORMAL LOW (ref 3.5–5.0)
Alkaline Phosphatase: 461 U/L — ABNORMAL HIGH (ref 38–126)
Alkaline Phosphatase: 464 U/L — ABNORMAL HIGH (ref 38–126)
Anion gap: 14 (ref 5–15)
Anion gap: 11 (ref 5–15)
BUN: 26 mg/dL — ABNORMAL HIGH (ref 6–20)
BUN: 29 mg/dL — ABNORMAL HIGH (ref 6–20)
CO2: 16 mmol/L — ABNORMAL LOW (ref 22–32)
CO2: 19 mmol/L — ABNORMAL LOW (ref 22–32)
Calcium: 7.6 mg/dL — ABNORMAL LOW (ref 8.9–10.3)
Calcium: 7.5 mg/dL — ABNORMAL LOW (ref 8.9–10.3)
Chloride: 96 mmol/L — ABNORMAL LOW (ref 98–111)
Chloride: 97 mmol/L — ABNORMAL LOW (ref 98–111)
Creatinine, Ser: 6.19 mg/dL — ABNORMAL HIGH (ref 0.44–1.00)
Creatinine, Ser: 6.47 mg/dL — ABNORMAL HIGH (ref 0.44–1.00)
GFR, Estimated: 9 mL/min — ABNORMAL LOW (ref >60)
GFR, Estimated: 8 mL/min — ABNORMAL LOW (ref >60)
Glucose, Bld: 75 mg/dL (ref 70–99)
Glucose, Bld: 55 mg/dL — ABNORMAL LOW (ref 70–99)
Potassium: 3.8 mmol/L (ref 3.5–5.1)
Potassium: 3.6 mmol/L (ref 3.5–5.1)
Sodium: 126 mmol/L — ABNORMAL LOW (ref 135–145)
Sodium: 127 mmol/L — ABNORMAL LOW (ref 135–145)
Total Bilirubin: 0.5 mg/dL (ref 0.0–1.2)
Total Bilirubin: 0.4 mg/dL (ref 0.0–1.2)
Total Protein: 4 g/dL — ABNORMAL LOW (ref 6.5–8.1)
Total Protein: 3.9 g/dL — ABNORMAL LOW (ref 6.5–8.1)

## 2024-04-15 LAB — CBC WITH DIFFERENTIAL/PLATELET
Abs Immature Granulocytes: 0.05 K/uL (ref 0.00–0.07)
Basophils Absolute: 0 K/uL (ref 0.0–0.1)
Basophils Relative: 1 %
Eosinophils Absolute: 0.9 K/uL — ABNORMAL HIGH (ref 0.0–0.5)
Eosinophils Relative: 10 %
HCT: 38.3 % (ref 36.0–46.0)
Hemoglobin: 12.4 g/dL (ref 12.0–15.0)
Immature Granulocytes: 1 %
Lymphocytes Relative: 47 %
Lymphs Abs: 4 K/uL (ref 0.7–4.0)
MCH: 27 pg (ref 26.0–34.0)
MCHC: 32.4 g/dL (ref 30.0–36.0)
MCV: 83.3 fL (ref 80.0–100.0)
Monocytes Absolute: 0.5 K/uL (ref 0.1–1.0)
Monocytes Relative: 6 %
Neutro Abs: 3 K/uL (ref 1.7–7.7)
Neutrophils Relative %: 35 %
Platelets: 174 K/uL (ref 150–400)
RBC: 4.6 MIL/uL (ref 3.87–5.11)
RDW: 14.5 % (ref 11.5–15.5)
WBC: 8.5 K/uL (ref 4.0–10.5)
nRBC: 0 % (ref 0.0–0.2)

## 2024-04-15 LAB — GLUCOSE, CAPILLARY
Glucose-Capillary: 106 mg/dL — ABNORMAL HIGH (ref 70–99)
Glucose-Capillary: 115 mg/dL — ABNORMAL HIGH (ref 70–99)
Glucose-Capillary: 135 mg/dL — ABNORMAL HIGH (ref 70–99)
Glucose-Capillary: 142 mg/dL — ABNORMAL HIGH (ref 70–99)
Glucose-Capillary: 146 mg/dL — ABNORMAL HIGH (ref 70–99)
Glucose-Capillary: 170 mg/dL — ABNORMAL HIGH (ref 70–99)
Glucose-Capillary: 178 mg/dL — ABNORMAL HIGH (ref 70–99)
Glucose-Capillary: 198 mg/dL — ABNORMAL HIGH (ref 70–99)
Glucose-Capillary: 51 mg/dL — ABNORMAL LOW (ref 70–99)
Glucose-Capillary: 55 mg/dL — ABNORMAL LOW (ref 70–99)
Glucose-Capillary: 58 mg/dL — ABNORMAL LOW (ref 70–99)
Glucose-Capillary: 59 mg/dL — ABNORMAL LOW (ref 70–99)
Glucose-Capillary: 66 mg/dL — ABNORMAL LOW (ref 70–99)

## 2024-04-15 LAB — MAGNESIUM
Magnesium: 1.8 mg/dL (ref 1.7–2.4)
Magnesium: 1.9 mg/dL (ref 1.7–2.4)

## 2024-04-15 LAB — PHOSPHORUS
Phosphorus: 5 mg/dL — ABNORMAL HIGH (ref 2.5–4.6)
Phosphorus: 5.4 mg/dL — ABNORMAL HIGH (ref 2.5–4.6)

## 2024-04-15 LAB — HEPATITIS B SURFACE ANTIGEN: Hepatitis B Surface Ag: NONREACTIVE

## 2024-04-15 MED ORDER — PENTAFLUOROPROP-TETRAFLUOROETH EX AERO: 1.0000 | INHALATION_SPRAY | CUTANEOUS | Status: DC | PRN

## 2024-04-15 MED ORDER — LAMOTRIGINE 25 MG PO TABS: 50.0000 mg | ORAL_TABLET | Freq: Every day | ORAL | Status: DC

## 2024-04-15 MED ORDER — LAMOTRIGINE 100 MG PO TABS: 100.0000 mg | ORAL_TABLET | Freq: Every day | ORAL | Status: DC

## 2024-04-15 MED ORDER — LIDOCAINE HCL (PF) 1 % IJ SOLN: 5.0000 mL | INTRAMUSCULAR | Status: DC | PRN

## 2024-04-15 MED ORDER — HEPARIN SODIUM (PORCINE) 1000 UNIT/ML DIALYSIS: 1000.0000 [IU] | INTRAMUSCULAR | Status: DC | PRN

## 2024-04-15 MED ORDER — HEPARIN SODIUM (PORCINE) 1000 UNIT/ML DIALYSIS: 20.0000 [IU]/kg | INTRAMUSCULAR | Status: DC | PRN

## 2024-04-15 MED ORDER — LIDOCAINE-PRILOCAINE 2.5-2.5 % EX CREA: 1.0000 | TOPICAL_CREAM | CUTANEOUS | Status: DC | PRN

## 2024-04-15 MED ORDER — ANTICOAGULANT SODIUM CITRATE 4% (200MG/5ML) IV SOLN: 5.0000 mL | Status: DC | PRN

## 2024-04-15 MED ORDER — DEXTROSE 50 % IV SOLN
INTRAVENOUS | Status: AC
  Filled 2024-04-15: qty 50

## 2024-04-15 MED ORDER — DEXTROSE 10 % IV SOLN: INTRAVENOUS | Status: DC

## 2024-04-15 MED ORDER — LAMOTRIGINE 25 MG PO TABS
25.0000 mg | ORAL_TABLET | Freq: Every day | ORAL | Status: DC
  Administered 2024-04-15 – 2024-04-19 (×5): 25 mg via ORAL
  Filled 2024-04-15 (×5): qty 1

## 2024-04-15 MED ORDER — ACETAMINOPHEN 325 MG PO TABS
ORAL_TABLET | ORAL | Status: AC
  Filled 2024-04-15: qty 2

## 2024-04-15 MED ORDER — HYDROMORPHONE HCL 1 MG/ML IJ SOLN
INTRAMUSCULAR | Status: AC
  Filled 2024-04-15: qty 0.5

## 2024-04-15 MED ORDER — ALTEPLASE 2 MG IJ SOLR: 2.0000 mg | Freq: Once | INTRAMUSCULAR | Status: DC | PRN

## 2024-04-15 MED ORDER — LAMOTRIGINE 25 MG PO TABS: 25.0000 mg | ORAL_TABLET | Freq: Every day | ORAL | Status: DC

## 2024-04-15 NOTE — Plan of Care (Signed)

## 2024-04-15 NOTE — Progress Notes (Addendum)
 Blood sugar retake 51.  Gave 2 juices. Gave 12.103mLD-50 per Hypoglycemia protocal.  Patient Asymptomatic.  Dr. Perri alerted

## 2024-04-15 NOTE — Progress Notes (Signed)
 Hypoglycemic Event  CBG: 66  Treatment: 4 oz juice/soda  Symptoms: Pale  Follow-up CBG: Time: 0916H  CBG Result:51mg /dl  Possible Reasons for Event: Inadequate meal intake  Comments/MD notified: As per HD nurse's note, MD notified.    HD nurse called and asked about the delay in transport. Informed HD nurse that I still need to recheck patient's CBG  @ 0845H which was 66mg /dl. HD nurse said that's fine and he can recheck it.    Unable to recheck patient's CBG due to patient is already in the HD unit. HD nurse rechecked her CBG which should show on the HD nurse's notes.                                      Karlene Gains RN made aware of this event.    Rawlins Stuard

## 2024-04-15 NOTE — Progress Notes (Signed)
 PROGRESS NOTE    Ashley Freeman  FMW:[id number] DOB: 1991/02/11 DOA: 04/13/2024 PCP: Keven Crumbly Pap, MD  Chief Complaint  Patient presents with   Seizures    Brief Narrative:   Ashley Freeman is Ashley Freeman 33 y.o. female with medical history significant of bipolar, type 1 diabetes, ESRD, heart failure reduced ejection fraction, hypertension, hyperlipidemia who Emergency Department after witnessed seizure.  Patient was just discharged from the hospital on 12/10 after being admitted with DKA and recurrent ascites.   She's been admitted in DKA.    Assessment & Plan:   Principal Problem:   Seizure (HCC)  DKA Uncontrolled T1DM with hyperglycemia and hypoglycemia Unclear precipitating cause - she notes compliance with her insulin  regimen CXR with findings concerning for RLL pneumonia Blood cultures NGx2 UA contaminated, not c/w UTI DKA resolved, but now having issues with hypoglycemia - will treat and follow   Seizure Disorder In setting of above Sounds like she's had seizures before  Head CT without acute abnormalities Neurology recommending keppra  while lamictal  Continue keppra   Per [place]  DMV statutes, patients with seizures are not allowed to drive until  they have been seizure-free for six months. Use caution when using heavy equipment or power tools. Avoid working on ladders or at heights. Take showers instead of baths. Ensure the water  temperature is not too high on the home water  heater. Do not go swimming alone. When caring for infants or small children, sit down when holding, feeding, or changing them to minimize risk of injury to the child in the event you have Ashley Freeman seizure. Also, Maintain good sleep hygiene. Avoid alcohol .  Possible Pneumonia Treating with ceftriaxone  (per pharmacy, has tolerated this in the past despite documented cephalosporin allergy) Urine strep, urine legionella, blood culture above  Elevated Liver Enzymes Shock  liver? Vs related to volume overload? Negative hepatitis panel from 10/2023 Will trend   Eosinophilia Unclear cause - will monitor - warrants outpatient follow up  ESRD Renal c/s for dialysis  Hypertension Coreg  Holding bumex  for now  HFrEF Entresto  Coreg  Bumex  on hold Volume per renal   Bipolar Disorder Cymbalta  zyprexa  Per neurology, recommending gradual uptitration of lamictal .  Will discuss with her the importance of compliance with this medication.  Will hold off on psych consult for now with neurology recommendation to continue lamictal .   Dyslipidemia Crestor     DVT prophylaxis: heparin  Code Status: full Family Communication: none Disposition:   Status is: Inpatient Remains inpatient appropriate because: need for continued inpatient care   Consultants:  Renal   Procedures:  none  Antimicrobials:  Anti-infectives (From admission, onward)    Start     Dose/Rate Route Frequency Ordered Stop   04/14/24 1800  cefTRIAXone  (ROCEPHIN ) 1 g in sodium chloride  0.9 % 100 mL IVPB        1 g 200 mL/hr over 30 Minutes Intravenous Daily at bedtime 04/14/24 1709         Subjective:  Notes her cough has been chronic No other complaints  Objective: Vitals:   04/15/24 0859 04/15/24 0903 04/15/24 0909 04/15/24 0930  BP:  136/64 (!) 146/70 (!) 118/51  Pulse:  66 67 69  Resp:  13 14 13   Temp:  (!) 97.3 F (36.3 C)    TempSrc:  Oral    SpO2:  93% 94% 96%  Weight: 67 kg     Height:        Intake/Output Summary (Last 24 hours) at 04/15/2024 1004 Last data filed at 04/14/2024  1600 Gross per 24 hour  Intake 2596.11 ml  Output --  Net 2596.11 ml   Filed Weights   04/13/24 1457 04/15/24 0859  Weight: 59 kg 67 kg    Examination:  Seen on dialysis General: sleepy, chronically ill appearing Lungs: unlabored with frequent cough Neurological: Alert , but sleepy. Moves all extremities 4 . Cranial nerves II through XII grossly intact. Extremities: No  clubbing or cyanosis. No edema.     Data Reviewed: I have personally reviewed following labs and imaging studies  CBC: Recent Labs  Lab 04/13/24 1531 04/13/24 1754 04/14/24 2257 04/15/24 0850  WBC 7.3  --  8.7 8.5  NEUTROABS 4.5  --  2.3 3.0  HGB 11.9* 13.3 12.5 12.4  HCT 40.8 39.0 38.4 38.3  MCV 91.7  --  82.6 83.3  PLT 178  --  182 174    Basic Metabolic Panel: Recent Labs  Lab 04/13/24 1531 04/13/24 1748 04/13/24 1754 04/13/24 2108 04/14/24 2257 04/15/24 0850  NA 123* 123* 123* 127* 126* 127*  K 3.6 4.3 4.1 3.4* 3.8 3.6  CL 88* 91*  --  96* 96* 97*  CO2 17* 16*  --  15* 16* 19*  GLUCOSE 1,155* 1,080*  --  716* 75 55*  BUN 25* 26*  --  26* 26* 29*  CREATININE 5.95* 6.04*  --  5.84* 6.19* 6.47*  CALCIUM  8.2* 7.4*  --  7.9* 7.6* 7.5*  MG 2.1  --   --   --  1.8 1.9  PHOS  --   --   --   --  5.0* 5.4*    GFR: Estimated Creatinine Clearance: 11.6 mL/min (Ashley Freeman) (by C-G formula based on SCr of 6.47 mg/dL (H)).  Liver Function Tests: Recent Labs  Lab 04/13/24 1531 04/14/24 2257 04/15/24 0850  AST 47* 258* 320*  ALT 18 66* 88*  ALKPHOS 362* 461* 464*  BILITOT 1.4* 0.5 0.4  PROT 5.8* 4.0* 3.9*  ALBUMIN  3.1* 2.6* 2.6*    CBG: Recent Labs  Lab 04/15/24 0259 04/15/24 0401 04/15/24 0751 04/15/24 0916 04/15/24 0943  GLUCAP 146* 106* 66* 51* 178*     Recent Results (from the past 240 hours)  Blood Culture (routine single)     Status: None   Collection Time: 04/06/24  8:13 AM   Specimen: BLOOD LEFT ARM  Result Value Ref Range Status   Specimen Description BLOOD LEFT ARM  Final   Special Requests   Final    BOTTLES DRAWN AEROBIC AND ANAEROBIC Blood Culture adequate volume   Culture   Final    NO GROWTH 5 DAYS Performed at [facility], [address], [place], [place] [address]    Report Status 04/11/2024 FINAL  Final  Body fluid culture w Gram Stain     Status: None   Collection Time: 04/06/24 11:22 AM   Specimen: Abdomen; Peritoneal Fluid   Result Value Ref Range Status   Specimen Description PERITONEAL  Final   Special Requests ABDOMEN  Final   Gram Stain NO WBC SEEN NO ORGANISMS SEEN   Final   Culture   Final    NO GROWTH 3 DAYS Performed at [facility], [name] [place], [place] [address]    Report Status 04/09/2024 FINAL  Final  Blood culture (routine x 2)     Status: None (Preliminary result)   Collection Time: 04/13/24  5:46 PM   Specimen: BLOOD LEFT HAND  Result Value Ref Range Status  Specimen Description BLOOD LEFT HAND  Final   Special Requests   Final    BOTTLES DRAWN AEROBIC AND ANAEROBIC Blood Culture results may not be optimal due to an inadequate volume of blood received in culture bottles   Culture   Final    NO GROWTH 2 DAYS Performed at [facility], [address] [place], [place] [address]    Report Status PENDING  Incomplete         Radiology Studies: DG Chest Port 1 View Result Date: 04/13/2024 EXAM: 1 VIEW(S) XRAY OF THE CHEST 04/13/2024 04:21:00 PM COMPARISON: 04/06/2024 CLINICAL HISTORY: cough FINDINGS: LINES, TUBES AND DEVICES: Right axillary stent. LUNGS AND PLEURA: Asymmetric interstitial/perihilar prominence, right lung predominant, favoring asymmetric interstitial edema over multifocal infection. Superimposed right lower lobe opacity favors pneumonia over atelectasis. Small bilateral pleural effusions, layering on the right. No pneumothorax. HEART AND MEDIASTINUM: Mild cardiomegaly is improved. BONES AND SOFT TISSUES: No acute osseous abnormality. IMPRESSION: 1. Suspected asymmetric interstitial edema, right lung predominant, less likely multifocal infection. 2. Superimposed right lower lobe opacity, favoring pneumonia over atelectasis. 3. Small bilateral pleural effusions, layering on the right. Electronically signed by: Pinkie Pebbles MD 04/13/2024 05:04 PM EST RP Workstation: [id number]   CT Head Wo Contrast Result Date: 04/13/2024 EXAM: CT HEAD WITHOUT  04/13/2024 04:33:32 PM TECHNIQUE: CT of the head was performed without the administration of intravenous contrast. Automated exposure control, iterative reconstruction, and/or weight based adjustment of the mA/kV was utilized to reduce the radiation dose to as low as reasonably achievable. COMPARISON: 11/11/2023 CLINICAL HISTORY: Seizure, new-onset, no history of trauma FINDINGS: BRAIN AND VENTRICLES: No acute intracranial hemorrhage. No mass effect or midline shift. No extra-axial fluid collection. No evidence of acute infarct. No hydrocephalus. Atherosclerosis of the carotid siphons and intracranial vertebral arteries. ORBITS: Bilateral lens replacement. SINUSES AND MASTOIDS: Mild mucosal thickening in the paranasal sinuses. SOFT TISSUES AND SKULL: No acute skull fracture. There is diffuse stranding of the facial subcutaneous fat bilaterally which could reflect edema or anasarca. IMPRESSION: 1. No acute intracranial abnormality. 2. Skull base atherosclerosis greater than expected for age. 3. There is diffuse stranding of the facial subcutaneous fat bilaterally which could reflect edema/anasarca. Electronically signed by: Donnice Mania MD 04/13/2024 04:42 PM EST RP Workstation: [id number]        Scheduled Meds:  carvedilol   25 mg Oral BID WC   Chlorhexidine  Gluconate Cloth  6 each Topical Q0600   DULoxetine   20 mg Oral Daily   heparin  injection (subcutaneous)  5,000 Units Subcutaneous Q8H   insulin  aspart  0-6 Units Subcutaneous Q4H   insulin  glargine  5 Units Subcutaneous Daily   lamoTRIgine   25 mg Oral Daily   levETIRAcetam   500 mg Oral Daily   And   [START ON 04/16/2024] levETIRAcetam   250 mg Oral Q T,Th,Sa-HD   OLANZapine   5 mg Oral QHS   pantoprazole   40 mg Oral BID   rosuvastatin   40 mg Oral Daily   sacubitril -valsartan   1 tablet Oral BID   Continuous Infusions:  cefTRIAXone  (ROCEPHIN )  IV 1 g (04/14/24 1727)   dextrose      insulin  Stopped (04/14/24 1639)     LOS: 2 days    Time  spent: over 30 min    Meliton Monte, MD [facility]   To contact the attending provider between 7A-7P or the covering provider during after hours 7P-7A, please log into the web site [email or web address] and access using universal [place] password for that web site. If  you do not have the password, please call the hospital operator.  04/15/2024, 10:04 AM

## 2024-04-15 NOTE — Progress Notes (Signed)
 BG 106 one hour post D-50.

## 2024-04-15 NOTE — Progress Notes (Signed)
 Patient isn't available for EEG at the moment due to being at dialysis, [facility] will check back this afternoon for availability.

## 2024-04-15 NOTE — Procedures (Signed)
 Patient Name: Ashley Freeman  MRN: [id number]  Epilepsy Attending: Arlin MALVA Krebs  Referring Physician/Provider: Perri DELENA Meliton Mickey., MD  Date: 04/15/2024 Duration: 24.20 mins  Patient history: 33yo female with seizure. EEG to evaluate for seizure.  Level of alertness: Awake  AEDs during EEG study: LEV, LTG  Technical aspects: This EEG study was done with scalp electrodes positioned according to the 10-20 International system of electrode placement. Electrical activity was reviewed with band pass filter of 1-70Hz , sensitivity of 7 uV/mm, display speed of 67mm/sec with a 60Hz  notched filter applied as appropriate. EEG data were recorded continuously and digitally stored.  Video monitoring was available and reviewed as appropriate.  Description: The posterior dominant rhythm consists of 8 Hz activity of moderate voltage (25-35 uV) seen predominantly in posterior head regions, symmetric and reactive to eye opening and eye closing. Physiologic photic driving was not seen during photic stimulation. Hyperventilation was not performed.     Of note, parts of study were difficult due to significant electrode and movement artifact.  IMPRESSION: This study is within normal limits. No seizures or epileptiform discharges were seen throughout the recording.  A normal interictal EEG does not exclude the diagnosis of epilepsy.   Shiheem Corporan O Monya Kozakiewicz

## 2024-04-15 NOTE — Progress Notes (Signed)
 Received patient @1417H . Vital signs taken. Asked patient to order her lunch. Due medications given. Made sure patient has her call button on. Asked patient about her pain level; patient denies any pain. Left patient comfortably lying on the bed with side rails up and bed alarm on.

## 2024-04-15 NOTE — Progress Notes (Signed)
 Received patient in bed to unit.  Alert and oriented.  Informed consent signed and in chart.   TX duration:3.5 hours.  Patient moved her arm to look at phone and set machine off a lot.  Patient had blood glucose issues that were fixed, see MAR.  Dr. Perri alerted and ordered d-10 drip, see MAR  Patient tolerated well.  Transported back to the room  Alert, without acute distress.  Hand-off given to patient's nurse.   Access used: Right upper arm graft.  Access issues: Patient sets machine off when she bends her arm or moves around.  Total UF removed: 3.9L Medication(s) given: Dialudid and Tylenol  for generalized pain, D-50 and juice given to help blood glucose.  D-10 IV continuous   04/15/24 1330  Vitals  Temp (!) 97.5 F (36.4 C)  Temp Source Oral  BP 114/79  MAP (mmHg) (!) 31  Pulse Rate 79  ECG Heart Rate 78  Resp 17  Weight 63 kg  Type of Weight Post-Dialysis  Oxygen Therapy  SpO2 94 %  O2 Device Room Air  During Treatment Monitoring  Duration of HD Treatment -hour(s) 3.5 hour(s)  HD Safety Checks Performed Yes  Intra-Hemodialysis Comments Tx completed  Dialysis Fluid Bolus Normal Saline  Bolus Amount (mL) 100 mL  Post Treatment  Dialyzer Clearance Clear  Hemodialysis Intake (mL) 100 mL  Liters Processed 64.4  Fluid Removed (mL) 3900 mL  Tolerated HD Treatment Yes  Post-Hemodialysis Comments see notes  AVG/AVF Arterial Site Held (minutes) 7 minutes  AVG/AVF Venous Site Held (minutes) 7 minutes  Fistula / Graft Right Upper arm Arteriovenous vein graft  Placement Date/Time: 07/06/22 1013   Placed prior to admission: No  Orientation: Right  Access Location: Upper arm  Access Type: Arteriovenous vein graft  Site Condition No complications  Fistula / Graft Assessment Present;Thrill;Bruit  Status Deaccessed;Patent  Drainage Description None     Camellia Brasil LPN [facility]

## 2024-04-15 NOTE — Progress Notes (Signed)
 Hypoglycemic Event  CBG: 59  Treatment: 8 oz juice/soda  Symptoms: None  Follow-up CBG: Time: 0208 CBG Result: 55 pt did not drink all of 8oz of juice at this time, over half left in cup.  After drinking all of juice bg 58, gave 1/2 amp bg now 146 at 0259, will recheck glucose in hour.   Possible Reasons for Event: Inadequate meal intake  Comments/MD notified: Provider on call notified of the above.     [name]

## 2024-04-15 NOTE — Progress Notes (Signed)
  [facility] Progress Note   Subjective:    Seen and examined patient on HD. Raised UFG to 4.5L. Tolerating this so far. Informed of blood sugars dropping in the 50s. 1/2 D5W with snack given by HD RN per protocol.   Objective Vitals:   04/15/24 0903 04/15/24 0909 04/15/24 0930 04/15/24 1004  BP: 136/64 (!) 146/70 (!) 118/51 (!) 109/50  Pulse: 66 67 69 78  Resp: 13 14 13 12   Temp: (!) 97.3 F (36.3 C)     TempSrc: Oral     SpO2: 93% 94% 96% 96%  Weight:      Height:       Physical Exam General: Alert; NAD Heart: S1 and S2; No murmurs, gallops, or rubs Lungs: Clear anteriorly Abdomen: Soft and non-tender Extremities: 1+ b/l LE edema Dialysis Access: R AVF   Filed Weights   04/13/24 1457 04/15/24 0859  Weight: 59 kg 67 kg    Intake/Output Summary (Last 24 hours) at 04/15/2024 1025 Last data filed at 04/14/2024 1600 Gross per 24 hour  Intake 2596.11 ml  Output --  Net 2596.11 ml    Additional Objective Labs: Basic Metabolic Panel: Recent Labs  Lab 04/13/24 2108 04/14/24 2257 04/15/24 0850  NA 127* 126* 127*  K 3.4* 3.8 3.6  CL 96* 96* 97*  CO2 15* 16* 19*  GLUCOSE 716* 75 55*  BUN 26* 26* 29*  CREATININE 5.84* 6.19* 6.47*  CALCIUM  7.9* 7.6* 7.5*  PHOS  --  5.0* 5.4*   Liver Function Tests: Recent Labs  Lab 04/13/24 1531 04/14/24 2257 04/15/24 0850  AST 47* 258* 320*  ALT 18 66* 88*  ALKPHOS 362* 461* 464*  BILITOT 1.4* 0.5 0.4  PROT 5.8* 4.0* 3.9*  ALBUMIN  3.1* 2.6* 2.6*   No results for input(s): LIPASE, AMYLASE in the last 168 hours. CBC: Recent Labs  Lab 04/13/24 1531 04/13/24 1754 04/14/24 2257 04/15/24 0850  WBC 7.3  --  8.7 8.5  NEUTROABS 4.5  --  2.3 3.0  HGB 11.9* 13.3 12.5 12.4  HCT 40.8 39.0 38.4 38.3  MCV 91.7  --  82.6 83.3  PLT 178  --  182 174   Blood Culture    Component Value Date/Time   SDES BLOOD LEFT HAND 04/13/2024 1746   SPECREQUEST  04/13/2024 1746    BOTTLES DRAWN AEROBIC AND ANAEROBIC  Blood Culture results may not be optimal due to an inadequate volume of blood received in culture bottles   CULT  04/13/2024 1746    NO GROWTH 2 DAYS Performed at [facility], [address], [place], [place] [address]    REPTSTATUS PENDING 04/13/2024 1746    Cardiac Enzymes: No results for input(s): CKTOTAL, CKMB, CKMBINDEX, TROPONINI in the last 168 hours. CBG: Recent Labs  Lab 04/15/24 0259 04/15/24 0401 04/15/24 0751 04/15/24 0916 04/15/24 0943  GLUCAP 146* 106* 66* 51* 178*   Iron  Studies: No results for input(s): IRON , TIBC, TRANSFERRIN, FERRITIN in the last 72 hours. Lab Results  Component Value Date   INR 1.5 (H) 04/06/2024   INR 1.2 02/22/2024   INR 1.3 (H) 02/04/2024   Studies/Results: DG Chest Port 1 View Result Date: 04/13/2024 EXAM: 1 VIEW(S) XRAY OF THE CHEST 04/13/2024 04:21:00 PM COMPARISON: 04/06/2024 CLINICAL HISTORY: cough FINDINGS: LINES, TUBES AND DEVICES: Right axillary stent. LUNGS AND PLEURA: Asymmetric interstitial/perihilar prominence, right lung predominant, favoring asymmetric interstitial edema over multifocal infection. Superimposed right lower lobe opacity favors pneumonia over atelectasis. Small bilateral pleural effusions,  layering on the right. No pneumothorax. HEART AND MEDIASTINUM: Mild cardiomegaly is improved. BONES AND SOFT TISSUES: No acute osseous abnormality. IMPRESSION: 1. Suspected asymmetric interstitial edema, right lung predominant, less likely multifocal infection. 2. Superimposed right lower lobe opacity, favoring pneumonia over atelectasis. 3. Small bilateral pleural effusions, layering on the right. Electronically signed by: Pinkie Pebbles MD 04/13/2024 05:04 PM EST RP Workstation: [id number]   CT Head Wo Contrast Result Date: 04/13/2024 EXAM: CT HEAD WITHOUT 04/13/2024 04:33:32 PM TECHNIQUE: CT of the head was performed without the administration of intravenous contrast. Automated exposure control,  iterative reconstruction, and/or weight based adjustment of the mA/kV was utilized to reduce the radiation dose to as low as reasonably achievable. COMPARISON: 11/11/2023 CLINICAL HISTORY: Seizure, new-onset, no history of trauma FINDINGS: BRAIN AND VENTRICLES: No acute intracranial hemorrhage. No mass effect or midline shift. No extra-axial fluid collection. No evidence of acute infarct. No hydrocephalus. Atherosclerosis of the carotid siphons and intracranial vertebral arteries. ORBITS: Bilateral lens replacement. SINUSES AND MASTOIDS: Mild mucosal thickening in the paranasal sinuses. SOFT TISSUES AND SKULL: No acute skull fracture. There is diffuse stranding of the facial subcutaneous fat bilaterally which could reflect edema or anasarca. IMPRESSION: 1. No acute intracranial abnormality. 2. Skull base atherosclerosis greater than expected for age. 3. There is diffuse stranding of the facial subcutaneous fat bilaterally which could reflect edema/anasarca. Electronically signed by: Donnice Mania MD 04/13/2024 04:42 PM EST RP Workstation: [id number]    Medications:  cefTRIAXone  (ROCEPHIN )  IV 1 g (04/14/24 1727)   dextrose      insulin  Stopped (04/14/24 1639)    carvedilol   25 mg Oral BID WC   Chlorhexidine  Gluconate Cloth  6 each Topical Q0600   DULoxetine   20 mg Oral Daily   heparin  injection (subcutaneous)  5,000 Units Subcutaneous Q8H   insulin  aspart  0-6 Units Subcutaneous Q4H   insulin  glargine  5 Units Subcutaneous Daily   lamoTRIgine   25 mg Oral Daily   levETIRAcetam   500 mg Oral Daily   And   [START ON 04/16/2024] levETIRAcetam   250 mg Oral Q T,Th,Sa-HD   OLANZapine   5 mg Oral QHS   pantoprazole   40 mg Oral BID   rosuvastatin   40 mg Oral Daily   sacubitril -valsartan   1 tablet Oral BID    Dialysis Orders: [facility] ([address] [phone]) 3hrs101min EDW 57.5kg BFR 450 DFR 800 2K/2.5Ca R AVG Heparin  bolus 1600 units with HD Calcitriol  1mcg - last  12/13 Sensipar  60mg  with HD - last 12/13 TUMS 1500mg  with HD - last 12/13     Assessment/Plan: Witnessed seizure - likely 2nd uncontrolled DM; neurology consulted; on Keppra . T1DM/DKA - Per primary Possible PNA - On Levaquin  for now, per primary ESRD - on HD TTS. On HD off schedule, will place back on routine schedule later this week. Hypertension/volume  - Noted facial edema and CXR showed interstitial edema and pleural effusions. She's not in distress. Push UF. UFG set 4.5L today. Anemia of CKD - Hgb 12.4; Fe/ESA is not indicated at this time Secondary Hyperparathyroidism - Checking phos in AM Bipolar - On Cymbalta  and Zyprexa  Nutrition - Renal diet with fluid restriction  Charmaine Piety, NP [facility] 04/15/2024,10:25 AM  LOS: 2 days

## 2024-04-15 NOTE — Progress Notes (Signed)
 EEG complete - results pending

## 2024-04-15 NOTE — Progress Notes (Signed)
@  1638H, Dr. Meliton made rounds. Informed him of the latest CBG (see results) and inquired about the next sliding scale dose. Dr. Meliton said to hold sliding scale dose and pause D10% continuous infusion.

## 2024-04-16 ENCOUNTER — Inpatient Hospital Stay (HOSPITAL_COMMUNITY)

## 2024-04-16 DIAGNOSIS — R569 Unspecified convulsions: Secondary | ICD-10-CM | POA: Diagnosis not present

## 2024-04-16 LAB — GLUCOSE, CAPILLARY
Glucose-Capillary: 88 mg/dL (ref 70–99)
Glucose-Capillary: 156 mg/dL — ABNORMAL HIGH (ref 70–99)
Glucose-Capillary: 116 mg/dL — ABNORMAL HIGH (ref 70–99)
Glucose-Capillary: 154 mg/dL — ABNORMAL HIGH (ref 70–99)
Glucose-Capillary: 274 mg/dL — ABNORMAL HIGH (ref 70–99)

## 2024-04-16 LAB — HEPATITIS B SURFACE ANTIBODY, QUANTITATIVE: Hep B S AB Quant (Post): <3.5 m[IU]/mL — ABNORMAL LOW

## 2024-04-16 MED ORDER — CHLORHEXIDINE GLUCONATE CLOTH 2 % EX PADS: 6.0000 | MEDICATED_PAD | Freq: Every day | CUTANEOUS | Status: DC

## 2024-04-16 NOTE — TOC Transition Note (Signed)
 Transition of Care [facility]) - Discharge Note   Patient Details  Name: Ashley Freeman MRN: [id number] Date of Birth: [birth year]  Transition of Care [facility]) CM/SW Contact:  Andrez JULIANNA George, RN Phone Number: 04/16/2024, 11:52 AM   Clinical Narrative:    Aldean GORMAN [place] is a 33 y.o. female with medical history significant of bipolar, type 1 diabetes, ESRD, heart failure reduced ejection fraction, hypertension, hyperlipidemia who Emergency Department after witnessed seizure.   Pt is from home with her parents.  She uses medicaid transportation for HD.  No DME.  She manages her medications and denies issues.  CM will provide transportation home for her via medicaid transport or cab.    Final next level of care: Home/Self Care Barriers to Discharge: No Barriers Identified   Patient Goals and CMS Choice            Discharge Placement                       Discharge Plan and Services Additional resources added to the After Visit Summary for                                       Social Drivers of Health (SDOH) Interventions SDOH Screenings   Food Insecurity: No Food Insecurity (04/13/2024)  Housing: Low Risk (04/13/2024)  Transportation Needs: No Transportation Needs (04/13/2024)  Utilities: Not At Risk (04/13/2024)  Financial Resource Strain: Medium Risk (11/15/2023)   Received from [facility]  Physical Activity: Insufficiently Active (03/02/2022)   Received from [facility]  Social Connections: Moderately Integrated (02/04/2024)  Stress: No Stress Concern Present (03/08/2023)   Received from [facility]  Tobacco Use: Low Risk (04/14/2024)     Readmission Risk Interventions    03/23/2024    2:19 PM 02/06/2024    2:01 PM 11/28/2023   11:49 AM  Readmission Risk Prevention Plan  Transportation Screening Complete Complete Complete  Medication Review Oceanographer) Complete Referral to Pharmacy Complete  PCP or  Specialist appointment within 3-5 days of discharge Complete Complete Complete  HRI or Home Care Consult Complete Complete Complete  SW Recovery Care/Counseling Consult  Complete   Palliative Care Screening Not Applicable Not Applicable Not Applicable  Skilled Nursing Facility Not Applicable Not Applicable Not Applicable

## 2024-04-16 NOTE — Progress Notes (Signed)
 [facility] Progress Note   Subjective:    Seen and examined patient at bedside. Tolerated yesterday's HD with 3.9L removed. She denies any acute complaints. Discussed with the Hospitalist. Plan for discharge tomorrow. Will plan for HD 1st before she leaves.  Objective Vitals:   04/16/24 0758 04/16/24 1133 04/16/24 1408 04/16/24 1510  BP: (!) 142/68 (!) 155/81 (!) 170/75 (!) 166/65  Pulse: 88 87 91 88  Resp: 18 18  16   Temp: 97.9 F (36.6 C) 98.6 F (37 C)  98.5 F (36.9 C)  TempSrc: Oral   Oral  SpO2: 99% 95% 99% 100%  Weight:      Height:       Physical Exam General: Alert; NAD Heart: S1 and S2; No murmurs, gallops, or rubs Lungs: Clear anteriorly Abdomen: Soft and non-tender Extremities: 1+ b/l LE edema Dialysis Access: R AVF    Filed Weights   04/13/24 1457 04/15/24 0859 04/15/24 1330  Weight: 59 kg 67 kg 63 kg    Intake/Output Summary (Last 24 hours) at 04/16/2024 1625 Last data filed at 04/16/2024 0400 Gross per 24 hour  Intake 684.57 ml  Output --  Net 684.57 ml    Additional Objective Labs: Basic Metabolic Panel: Recent Labs  Lab 04/13/24 2108 04/14/24 2257 04/15/24 0850  NA 127* 126* 127*  K 3.4* 3.8 3.6  CL 96* 96* 97*  CO2 15* 16* 19*  GLUCOSE 716* 75 55*  BUN 26* 26* 29*  CREATININE 5.84* 6.19* 6.47*  CALCIUM  7.9* 7.6* 7.5*  PHOS  --  5.0* 5.4*   Liver Function Tests: Recent Labs  Lab 04/13/24 1531 04/14/24 2257 04/15/24 0850  AST 47* 258* 320*  ALT 18 66* 88*  ALKPHOS 362* 461* 464*  BILITOT 1.4* 0.5 0.4  PROT 5.8* 4.0* 3.9*  ALBUMIN  3.1* 2.6* 2.6*   No results for input(s): LIPASE, AMYLASE in the last 168 hours. CBC: Recent Labs  Lab 04/13/24 1531 04/13/24 1754 04/14/24 2257 04/15/24 0850  WBC 7.3  --  8.7 8.5  NEUTROABS 4.5  --  2.3 3.0  HGB 11.9* 13.3 12.5 12.4  HCT 40.8 39.0 38.4 38.3  MCV 91.7  --  82.6 83.3  PLT 178  --  182 174   Blood Culture    Component Value Date/Time   SDES BLOOD  LEFT HAND 04/13/2024 1746   SPECREQUEST  04/13/2024 1746    BOTTLES DRAWN AEROBIC AND ANAEROBIC Blood Culture results may not be optimal due to an inadequate volume of blood received in culture bottles   CULT  04/13/2024 1746    NO GROWTH 3 DAYS Performed at [facility], [address], [place], [place] [address]    REPTSTATUS PENDING 04/13/2024 1746    Cardiac Enzymes: No results for input(s): CKTOTAL, CKMB, CKMBINDEX, TROPONINI in the last 168 hours. CBG: Recent Labs  Lab 04/15/24 2340 04/16/24 0354 04/16/24 0759 04/16/24 1134 04/16/24 1600  GLUCAP 115* 88 156* 116* 154*   Iron  Studies: No results for input(s): IRON , TIBC, TRANSFERRIN, FERRITIN in the last 72 hours. Lab Results  Component Value Date   INR 1.5 (H) 04/06/2024   INR 1.2 02/22/2024   INR 1.3 (H) 02/04/2024   Studies/Results: EEG adult Result Date: 04/15/2024 Shelton Arlin KIDD, MD     04/15/2024  5:38 PM Patient Name: Ashley Freeman MRN: [id number] Epilepsy Attending: Arlin KIDD Shelton Referring Physician/Provider: Perri DELENA Meliton Mickey., MD Date: 04/15/2024 Duration: 24.20 mins Patient history: 33yo female with seizure. EEG to  evaluate for seizure. Level of alertness: Awake AEDs during EEG study: LEV, LTG Technical aspects: This EEG study was done with scalp electrodes positioned according to the 10-20 International system of electrode placement. Electrical activity was reviewed with band pass filter of 1-70Hz , sensitivity of 7 uV/mm, display speed of 36mm/sec with a 60Hz  notched filter applied as appropriate. EEG data were recorded continuously and digitally stored.  Video monitoring was available and reviewed as appropriate. Description: The posterior dominant rhythm consists of 8 Hz activity of moderate voltage (25-35 uV) seen predominantly in posterior head regions, symmetric and reactive to eye opening and eye closing. Physiologic photic driving was not seen during photic  stimulation. Hyperventilation was not performed.   Of note, parts of study were difficult due to significant electrode and movement artifact. IMPRESSION: This study is within normal limits. No seizures or epileptiform discharges were seen throughout the recording. A normal interictal EEG does not exclude the diagnosis of epilepsy. Priyanka O Yadav    Medications:  cefTRIAXone  (ROCEPHIN )  IV 1 g (04/15/24 2123)   insulin  Stopped (04/14/24 1639)    carvedilol   25 mg Oral BID WC   Chlorhexidine  Gluconate Cloth  6 each Topical Q0600   DULoxetine   20 mg Oral Daily   heparin  injection (subcutaneous)  5,000 Units Subcutaneous Q8H   insulin  aspart  0-6 Units Subcutaneous Q4H   insulin  glargine  5 Units Subcutaneous Daily   lamoTRIgine   25 mg Oral Daily   Followed by   NOREEN ON 04/28/2024] lamoTRIgine   50 mg Oral Daily   Followed by   NOREEN ON 05/12/2024] lamoTRIgine   100 mg Oral Daily   Followed by   NOREEN ON 05/19/2024] lamoTRIgine   100 mg Oral Daily   [START ON 05/19/2024] lamoTRIgine   25 mg Oral QHS   Followed by   NOREEN ON 05/26/2024] lamoTRIgine   50 mg Oral QHS   Followed by   NOREEN ON 06/02/2024] lamoTRIgine   100 mg Oral QHS   levETIRAcetam   500 mg Oral Daily   And   levETIRAcetam   250 mg Oral Q T,Th,Sa-HD   OLANZapine   5 mg Oral QHS   pantoprazole   40 mg Oral BID   sacubitril -valsartan   1 tablet Oral BID    Dialysis Orders: [facility] ([address] [id number]) 3hrs29min EDW 57.5kg BFR 450 DFR 800 2K/2.5Ca R AVG Heparin  bolus 1600 units with HD Calcitriol  1mcg - last 12/13 Sensipar  60mg  with HD - last 12/13 TUMS 1500mg  with HD - last 12/13    Assessment/Plan: Witnessed seizure - likely 2nd uncontrolled DM; neurology consulted; on Keppra . T1DM/DKA - Per primary Possible PNA - On Levaquin  for now, per primary ESRD - on HD TTS. On HD off schedule, next HD 12/19. Recommend she also do HD on Saturday at her outpatient HD center to be back on her routine  schedule Hypertension/volume  - Na's are still low. Noted facial edema and CXR showed interstitial edema and pleural effusions. She's not in distress. Push UF. UFG set 4-5L for tomorrow. Anemia of CKD - Hgb 12.4; Fe/ESA is not indicated at this time Secondary Hyperparathyroidism - Continue management t her outpatient HD center Bipolar - On Cymbalta  and Zyprexa  Nutrition - Renal diet with fluid restriction Dispo - Plan for dc tomorrow. She will get HD before she leaves. Recommend she resume HD again on Saturday in outpatient to be back on her routine schedule.  Charmaine Piety, NP [facility] 04/16/2024,4:25 PM  LOS: 3 days

## 2024-04-16 NOTE — Progress Notes (Addendum)
 PROGRESS NOTE    Ashley Freeman  FMW:[id number] DOB: Aug 28, 1990 DOA: 04/13/2024 PCP: Keven Crumbly Pap, MD  Chief Complaint  Patient presents with   Seizures    Brief Narrative:   Ashley Freeman is Ashley Freeman 33 y.o. female with medical history significant of bipolar, type 1 diabetes, ESRD, heart failure reduced ejection fraction, hypertension, hyperlipidemia who Emergency Department after witnessed seizure.  Patient was just discharged from the hospital on 12/10 after being admitted with DKA and recurrent ascites.   She's been admitted in DKA.    Assessment & Plan:   Principal Problem:   Seizure (HCC)  DKA Uncontrolled T1DM with hyperglycemia and hypoglycemia Unclear precipitating cause - she notes compliance with her insulin  regimen CXR with findings concerning for RLL pneumonia Blood cultures NGx2 UA contaminated, not c/w UTI Basal, SSI - follow   Seizure Disorder In setting of above Sounds like she's had seizures before  Head CT without acute abnormalities Neurology recommending keppra  while lamictal  Continue keppra   Per [place]  DMV statutes, patients with seizures are not allowed to drive until  they have been seizure-free for six months. Use caution when using heavy equipment or power tools. Avoid working on ladders or at heights. Take showers instead of baths. Ensure the water  temperature is not too high on the home water  heater. Do not go swimming alone. When caring for infants or small children, sit down when holding, feeding, or changing them to minimize risk of injury to the child in the event you have Ashley Freeman seizure. Also, Maintain good sleep hygiene. Avoid alcohol .  Possible Pneumonia Chronic Cough Treating with ceftriaxone  (per pharmacy, has tolerated this in the past despite documented cephalosporin allergy) Urine strep, urine legionella, blood culture above Will get CT chest with her chronic cough   Elevated Liver Enzymes Shock  liver? Vs related to volume overload? Negative hepatitis panel from 10/2023 Will trend - hold statin  Eosinophilia Unclear cause - will monitor - warrants outpatient follow up  ESRD Renal c/s for dialysis  Hypertension Coreg  Holding bumex  for now  HFrEF Entresto  Coreg  Bumex  on hold Volume per renal   Bipolar Disorder Cymbalta  zyprexa  Per neurology, recommending gradual uptitration of lamictal . Discussed with her the importance of compliance with this medication.  Will hold off on psych consult for now with neurology recommendation to continue lamictal .   Dyslipidemia Crestor     DVT prophylaxis: heparin  Code Status: full Family Communication: none Disposition:   Status is: Inpatient Remains inpatient appropriate because: need for continued inpatient care   Consultants:  Renal   Procedures:  none  Antimicrobials:  Anti-infectives (From admission, onward)    Start     Dose/Rate Route Frequency Ordered Stop   04/14/24 1800  cefTRIAXone  (ROCEPHIN ) 1 g in sodium chloride  0.9 % 100 mL IVPB        1 g 200 mL/hr over 30 Minutes Intravenous Daily at bedtime 04/14/24 1709         Subjective:  No complaints, notes plan for dialysis tomorrow  Objective: Vitals:   04/16/24 0758 04/16/24 1133 04/16/24 1408 04/16/24 1510  BP: (!) 142/68 (!) 155/81 (!) 170/75 (!) 166/65  Pulse: 88 87 91 88  Resp: 18 18  16   Temp: 97.9 F (36.6 C) 98.6 F (37 C)  98.5 F (36.9 C)  TempSrc: Oral   Oral  SpO2: 99% 95% 99% 100%  Weight:      Height:        Intake/Output Summary (Last 24 hours) at 04/16/2024  1702 Last data filed at 04/16/2024 0400 Gross per 24 hour  Intake 684.57 ml  Output --  Net 684.57 ml   Filed Weights   04/13/24 1457 04/15/24 0859 04/15/24 1330  Weight: 59 kg 67 kg 63 kg    Examination:  General: No acute distress. Cardiovascular: RRR Lungs: unlabored, occasional cough Abdomen: Soft, nontender, nondistended Neurological: Alert and oriented  3. Moves all extremities 4 with equal strength. Cranial nerves II through XII grossly intact. Extremities: No clubbing or cyanosis. No edema.   Data Reviewed: I have personally reviewed following labs and imaging studies  CBC: Recent Labs  Lab 04/13/24 1531 04/13/24 1754 04/14/24 2257 04/15/24 0850  WBC 7.3  --  8.7 8.5  NEUTROABS 4.5  --  2.3 3.0  HGB 11.9* 13.3 12.5 12.4  HCT 40.8 39.0 38.4 38.3  MCV 91.7  --  82.6 83.3  PLT 178  --  182 174    Basic Metabolic Panel: Recent Labs  Lab 04/13/24 1531 04/13/24 1748 04/13/24 1754 04/13/24 2108 04/14/24 2257 04/15/24 0850  NA 123* 123* 123* 127* 126* 127*  K 3.6 4.3 4.1 3.4* 3.8 3.6  CL 88* 91*  --  96* 96* 97*  CO2 17* 16*  --  15* 16* 19*  GLUCOSE 1,155* 1,080*  --  716* 75 55*  BUN 25* 26*  --  26* 26* 29*  CREATININE 5.95* 6.04*  --  5.84* 6.19* 6.47*  CALCIUM  8.2* 7.4*  --  7.9* 7.6* 7.5*  MG 2.1  --   --   --  1.8 1.9  PHOS  --   --   --   --  5.0* 5.4*    GFR: Estimated Creatinine Clearance: 11.3 mL/min (Shaquella Stamant) (by C-G formula based on SCr of 6.47 mg/dL (H)).  Liver Function Tests: Recent Labs  Lab 04/13/24 1531 04/14/24 2257 04/15/24 0850  AST 47* 258* 320*  ALT 18 66* 88*  ALKPHOS 362* 461* 464*  BILITOT 1.4* 0.5 0.4  PROT 5.8* 4.0* 3.9*  ALBUMIN  3.1* 2.6* 2.6*    CBG: Recent Labs  Lab 04/15/24 2340 04/16/24 0354 04/16/24 0759 04/16/24 1134 04/16/24 1600  GLUCAP 115* 88 156* 116* 154*     Recent Results (from the past 240 hours)  Blood culture (routine x 2)     Status: None (Preliminary result)   Collection Time: 04/13/24  5:46 PM   Specimen: BLOOD LEFT HAND  Result Value Ref Range Status   Specimen Description BLOOD LEFT HAND  Final   Special Requests   Final    BOTTLES DRAWN AEROBIC AND ANAEROBIC Blood Culture results may not be optimal due to an inadequate volume of blood received in culture bottles   Culture   Final    NO GROWTH 3 DAYS Performed at [facility], [address] [place], [place] [address]    Report Status PENDING  Incomplete         Radiology Studies: EEG adult Result Date: 04/15/2024 Shelton Arlin KIDD, MD     04/15/2024  5:38 PM Patient Name: Ashley Freeman MRN: [id number] Epilepsy Attending: Arlin KIDD Shelton Referring Physician/Provider: Perri DELENA Meliton Mickey., MD Date: 04/15/2024 Duration: 24.20 mins Patient history: 33yo female with seizure. EEG to evaluate for seizure. Level of alertness: Awake AEDs during EEG study: LEV, LTG Technical aspects: This EEG study was done with scalp electrodes positioned according to the 10-20 International system of electrode placement. Electrical activity was reviewed with band pass filter of 1-70Hz ,  sensitivity of 7 uV/mm, display speed of 50mm/sec with Kourtney Terriquez 60Hz  notched filter applied as appropriate. EEG data were recorded continuously and digitally stored.  Video monitoring was available and reviewed as appropriate. Description: The posterior dominant rhythm consists of 8 Hz activity of moderate voltage (25-35 uV) seen predominantly in posterior head regions, symmetric and reactive to eye opening and eye closing. Physiologic photic driving was not seen during photic stimulation. Hyperventilation was not performed.   Of note, parts of study were difficult due to significant electrode and movement artifact. IMPRESSION: This study is within normal limits. No seizures or epileptiform discharges were seen throughout the recording. Deeksha Cotrell normal interictal EEG does not exclude the diagnosis of epilepsy. Priyanka O Yadav        Scheduled Meds:  carvedilol   25 mg Oral BID WC   [START ON 04/17/2024] Chlorhexidine  Gluconate Cloth  6 each Topical Q0600   DULoxetine   20 mg Oral Daily   heparin  injection (subcutaneous)  5,000 Units Subcutaneous Q8H   insulin  aspart  0-6 Units Subcutaneous Q4H   insulin  glargine  5 Units Subcutaneous Daily   lamoTRIgine   25 mg Oral Daily   Followed by   NOREEN ON 04/28/2024]  lamoTRIgine   50 mg Oral Daily   Followed by   NOREEN ON 05/12/2024] lamoTRIgine   100 mg Oral Daily   Followed by   NOREEN ON 05/19/2024] lamoTRIgine   100 mg Oral Daily   [START ON 05/19/2024] lamoTRIgine   25 mg Oral QHS   Followed by   NOREEN ON 05/26/2024] lamoTRIgine   50 mg Oral QHS   Followed by   NOREEN ON 06/02/2024] lamoTRIgine   100 mg Oral QHS   levETIRAcetam   500 mg Oral Daily   And   levETIRAcetam   250 mg Oral Q T,Th,Sa-HD   OLANZapine   5 mg Oral QHS   pantoprazole   40 mg Oral BID   sacubitril -valsartan   1 tablet Oral BID   Continuous Infusions:  cefTRIAXone  (ROCEPHIN )  IV 1 g (04/15/24 2123)   insulin  Stopped (04/14/24 1639)     LOS: 3 days    Time spent: over 30 min    Meliton Monte, MD [facility]   To contact the attending provider between 7A-7P or the covering provider during after hours 7P-7A, please log into the web site [email or web address] and access using universal [place] password for that web site. If you do not have the password, please call the hospital operator.  04/16/2024, 5:02 PM

## 2024-04-16 NOTE — Plan of Care (Signed)
 Pt is alert and oriented.

## 2024-04-16 NOTE — Progress Notes (Signed)
 Ok to hold Crestor  while LFTs are trending up per Dr. Perri.  Sergio Batch, PharmD, BCIDP, AAHIVP, CPP Infectious Disease Pharmacist 04/16/2024 11:23 AM

## 2024-04-16 NOTE — Progress Notes (Signed)
 Pt is alert and fully oriented x 4, afebrile, stable hemodynamically, NSR on the monitor, no acute distress noted overnight. She has complaints of headache and generalized pain. She requests dilaudid  only. Pt refuses Vicodin. She stated  it does not work for me.  Pt has been able to rest and sleep well after dilaudid  was given. CBG q 4 hrs, no hypoglycemic episode tonight. Pt has very good appetite. Plan of care is reviewed. Pt has been progressing. We will continue to monitor.    04/16/24 0400  Mobility (See group info for [facility] equipment and weight capacities recommendations)  HOB Elevated/Bed Position Self regulated  Activity Ambulated independently  Range of Motion/Exercises Active;All extremities  Level of Assistance Independent after set-up  Assistive Device None  Distance Ambulated (ft) 30 ft  Activity Response Tolerated well  Transport method Ambulatory    Wendi Dash, RN

## 2024-04-17 DIAGNOSIS — R569 Unspecified convulsions: Secondary | ICD-10-CM | POA: Diagnosis not present

## 2024-04-17 LAB — CBC WITH DIFFERENTIAL/PLATELET
Abs Immature Granulocytes: 0.02 K/uL (ref 0.00–0.07)
Basophils Absolute: 0 K/uL (ref 0.0–0.1)
Basophils Relative: 1 %
Eosinophils Absolute: 0.6 K/uL — ABNORMAL HIGH (ref 0.0–0.5)
Eosinophils Relative: 8 %
HCT: 31.5 % — ABNORMAL LOW (ref 36.0–46.0)
Hemoglobin: 9.6 g/dL — ABNORMAL LOW (ref 12.0–15.0)
Immature Granulocytes: 0 %
Lymphocytes Relative: 43 %
Lymphs Abs: 3.2 K/uL (ref 0.7–4.0)
MCH: 26.7 pg (ref 26.0–34.0)
MCHC: 30.5 g/dL (ref 30.0–36.0)
MCV: 87.7 fL (ref 80.0–100.0)
Monocytes Absolute: 0.4 K/uL (ref 0.1–1.0)
Monocytes Relative: 6 %
Neutro Abs: 3 K/uL (ref 1.7–7.7)
Neutrophils Relative %: 42 %
Platelets: 165 K/uL (ref 150–400)
RBC: 3.59 MIL/uL — ABNORMAL LOW (ref 3.87–5.11)
RDW: 14.6 % (ref 11.5–15.5)
WBC: 7.3 K/uL (ref 4.0–10.5)
nRBC: 0 % (ref 0.0–0.2)

## 2024-04-17 LAB — GLUCOSE, CAPILLARY
Glucose-Capillary: 148 mg/dL — ABNORMAL HIGH (ref 70–99)
Glucose-Capillary: 189 mg/dL — ABNORMAL HIGH (ref 70–99)
Glucose-Capillary: 293 mg/dL — ABNORMAL HIGH (ref 70–99)
Glucose-Capillary: 297 mg/dL — ABNORMAL HIGH (ref 70–99)
Glucose-Capillary: 320 mg/dL — ABNORMAL HIGH (ref 70–99)
Glucose-Capillary: 433 mg/dL — ABNORMAL HIGH (ref 70–99)
Glucose-Capillary: 469 mg/dL — ABNORMAL HIGH (ref 70–99)

## 2024-04-17 LAB — COMPREHENSIVE METABOLIC PANEL WITH GFR
ALT: 73 U/L — ABNORMAL HIGH (ref 0–44)
AST: 203 U/L — ABNORMAL HIGH (ref 15–41)
Albumin: 3.2 g/dL — ABNORMAL LOW (ref 3.5–5.0)
Alkaline Phosphatase: 475 U/L — ABNORMAL HIGH (ref 38–126)
Anion gap: 14 (ref 5–15)
BUN: 33 mg/dL — ABNORMAL HIGH (ref 6–20)
CO2: 19 mmol/L — ABNORMAL LOW (ref 22–32)
Calcium: 8.1 mg/dL — ABNORMAL LOW (ref 8.9–10.3)
Chloride: 106 mmol/L (ref 98–111)
Creatinine, Ser: 6.23 mg/dL — ABNORMAL HIGH (ref 0.44–1.00)
GFR, Estimated: 9 mL/min — ABNORMAL LOW
Glucose, Bld: 281 mg/dL — ABNORMAL HIGH (ref 70–99)
Potassium: 3.9 mmol/L (ref 3.5–5.1)
Sodium: 138 mmol/L (ref 135–145)
Total Bilirubin: 0.3 mg/dL (ref 0.0–1.2)
Total Protein: 4.7 g/dL — ABNORMAL LOW (ref 6.5–8.1)

## 2024-04-17 LAB — PHOSPHORUS: Phosphorus: 5.5 mg/dL — ABNORMAL HIGH (ref 2.5–4.6)

## 2024-04-17 LAB — MAGNESIUM: Magnesium: 2.4 mg/dL (ref 1.7–2.4)

## 2024-04-17 MED ORDER — LIDOCAINE HCL (PF) 1 % IJ SOLN: 5.0000 mL | INTRAMUSCULAR | Status: DC | PRN

## 2024-04-17 MED ORDER — ALTEPLASE 2 MG IJ SOLR: 2.0000 mg | Freq: Once | INTRAMUSCULAR | Status: DC | PRN

## 2024-04-17 MED ORDER — PENTAFLUOROPROP-TETRAFLUOROETH EX AERO: 1.0000 | INHALATION_SPRAY | CUTANEOUS | Status: DC | PRN

## 2024-04-17 MED ORDER — HEPARIN SODIUM (PORCINE) 1000 UNIT/ML DIALYSIS: 1000.0000 [IU] | INTRAMUSCULAR | Status: DC | PRN

## 2024-04-17 MED ORDER — LIDOCAINE-PRILOCAINE 2.5-2.5 % EX CREA: 1.0000 | TOPICAL_CREAM | CUTANEOUS | Status: DC | PRN

## 2024-04-17 NOTE — Progress Notes (Signed)
 " PROGRESS NOTE    Ashley Freeman  FMW:[id number] DOB: [birth year] DOA: 04/13/2024 PCP: Keven Crumbly Pap, MD  Chief Complaint  Patient presents with   Seizures    Brief Narrative:   Ashley Freeman is Ashley Freeman 33 y.o. female with medical history significant of bipolar, type 1 diabetes, ESRD, heart failure reduced ejection fraction, hypertension, hyperlipidemia who Emergency Department after witnessed seizure.  Patient was just discharged from the hospital on 12/10 after being admitted with DKA and recurrent ascites.   She's been admitted in DKA.    Assessment & Plan:   Principal Problem:   Seizure (HCC)  DKA Uncontrolled T1DM with hyperglycemia and hypoglycemia Unclear precipitating cause - she notes compliance with her insulin  regimen CXR with findings concerning for RLL pneumonia Blood cultures NGx2 UA contaminated, not c/w UTI Basal, SSI - follow  Hyperglycemic this PM, would prefer BG better controlled before discharge, will monitor  Seizure Disorder In setting of above Sounds like she's had seizures before  Head CT without acute abnormalities Neurology recommending keppra  while lamictal  Continue keppra   Per [place]  DMV statutes, patients with seizures are not allowed to drive until  they have been seizure-free for six months. Use caution when using heavy equipment or power tools. Avoid working on ladders or at heights. Take showers instead of baths. Ensure the water  temperature is not too high on the home water  heater. Do not go swimming alone. When caring for infants or small children, sit down when holding, feeding, or changing them to minimize risk of injury to the child in the event you have Ashley Freeman seizure. Also, Maintain good sleep hygiene. Avoid alcohol .  Possible Pneumonia Chronic Cough Treating with ceftriaxone  (per pharmacy, has tolerated this in the past despite documented cephalosporin allergy) Urine strep, urine legionella, blood  culture above CT chest with small to moderate pulm edema, small bilateral pleural effusions, mild ascites and diffuse body wall edema - manage volume with dialysis  Elevated Liver Enzymes Shock liver? Vs related to volume overload? Negative hepatitis panel from 10/2023 Will trend - hold statin  Eosinophilia Unclear cause - will monitor - warrants outpatient follow up  ESRD Renal c/s for dialysis  Hypertension Coreg  Holding bumex  for now  HFrEF Entresto  Coreg  Bumex  on hold Volume per renal   Bipolar Disorder Cymbalta  zyprexa  Per neurology, recommending gradual uptitration of lamictal . Discussed with her the importance of compliance with this medication.  Will hold off on psych consult for now with neurology recommendation to continue lamictal .   Dyslipidemia Crestor     DVT prophylaxis: heparin  Code Status: full Family Communication: none Disposition:   Status is: Inpatient Remains inpatient appropriate because: need for continued inpatient care   Consultants:  Renal   Procedures:  none  Antimicrobials:  Anti-infectives (From admission, onward)    Start     Dose/Rate Route Frequency Ordered Stop   04/14/24 1800  cefTRIAXone  (ROCEPHIN ) 1 g in sodium chloride  0.9 % 100 mL IVPB        1 g 200 mL/hr over 30 Minutes Intravenous Daily at bedtime 04/14/24 1709         Subjective:  No complaints  Objective: Vitals:   04/17/24 1209 04/17/24 1212 04/17/24 1322 04/17/24 1604  BP: (!) 171/96 (!) 185/9 (!) 167/82 (!) 155/89  Pulse: 85 97 95 92  Resp: (!) 26 19 18 16   Temp: 98.2 F (36.8 C)  99.6 F (37.6 C)   TempSrc: Oral  Oral   SpO2: 99% 100% 97% 98%  Weight: 54.1 kg     Height:        Intake/Output Summary (Last 24 hours) at 04/17/2024 1636 Last data filed at 04/17/2024 1209 Gross per 24 hour  Intake --  Output 5000 ml  Net -5000 ml   Filed Weights   04/15/24 1330 04/17/24 0812 04/17/24 1209  Weight: 63 kg 59.1 kg 54.1 kg     Examination:  General: No acute distress. Cardiovascular: RRR Lungs: unlabored Neurological: Alert and oriented 3. Moves all extremities 4 with equal strength. Cranial nerves II through XII grossly intact. Extremities: No clubbing or cyanosis. No edema.  Data Reviewed: I have personally reviewed following labs and imaging studies  CBC: Recent Labs  Lab 04/13/24 1531 04/13/24 1754 04/14/24 2257 04/15/24 0850 04/17/24 0830  WBC 7.3  --  8.7 8.5 7.3  NEUTROABS 4.5  --  2.3 3.0 3.0  HGB 11.9* 13.3 12.5 12.4 9.6*  HCT 40.8 39.0 38.4 38.3 31.5*  MCV 91.7  --  82.6 83.3 87.7  PLT 178  --  182 174 165    Basic Metabolic Panel: Recent Labs  Lab 04/13/24 1531 04/13/24 1748 04/13/24 1754 04/13/24 2108 04/14/24 2257 04/15/24 0850 04/17/24 0830  NA 123* 123* 123* 127* 126* 127* 138  K 3.6 4.3 4.1 3.4* 3.8 3.6 3.9  CL 88* 91*  --  96* 96* 97* 106  CO2 17* 16*  --  15* 16* 19* 19*  GLUCOSE 1,155* 1,080*  --  716* 75 55* 281*  BUN 25* 26*  --  26* 26* 29* 33*  CREATININE 5.95* 6.04*  --  5.84* 6.19* 6.47* 6.23*  CALCIUM  8.2* 7.4*  --  7.9* 7.6* 7.5* 8.1*  MG 2.1  --   --   --  1.8 1.9 2.4  PHOS  --   --   --   --  5.0* 5.4* 5.5*    GFR: Estimated Creatinine Clearance: 10.8 mL/min (Ashley Freeman) (by C-G formula based on SCr of 6.23 mg/dL (H)).  Liver Function Tests: Recent Labs  Lab 04/13/24 1531 04/14/24 2257 04/15/24 0850 04/17/24 0830  AST 47* 258* 320* 203*  ALT 18 66* 88* 73*  ALKPHOS 362* 461* 464* 475*  BILITOT 1.4* 0.5 0.4 0.3  PROT 5.8* 4.0* 3.9* 4.7*  ALBUMIN  3.1* 2.6* 2.6* 3.2*    CBG: Recent Labs  Lab 04/17/24 0021 04/17/24 0456 04/17/24 0719 04/17/24 1319 04/17/24 1619  GLUCAP 293* 148* 189* 297* 469*     Recent Results (from the past 240 hours)  Blood culture (routine x 2)     Status: None (Preliminary result)   Collection Time: 04/13/24  5:46 PM   Specimen: BLOOD LEFT HAND  Result Value Ref Range Status   Specimen Description BLOOD LEFT  HAND  Final   Special Requests   Final    BOTTLES DRAWN AEROBIC AND ANAEROBIC Blood Culture results may not be optimal due to an inadequate volume of blood received in culture bottles   Culture   Final    NO GROWTH 4 DAYS Performed at [facility], [address] [place], [place] [address]    Report Status PENDING  Incomplete         Radiology Studies: CT CHEST WO CONTRAST Result Date: 04/17/2024 EXAM: CT CHEST WITHOUT CONTRAST 04/16/2024 08:37:00 PM TECHNIQUE: CT of the chest was performed without the administration of intravenous contrast. Multiplanar reformatted images are provided for review. Automated exposure control, iterative reconstruction, and/or weight based adjustment of the mA/kV was utilized  to reduce the radiation dose to as low as reasonably achievable. COMPARISON: None available. CLINICAL HISTORY: Respiratory illness, nondiagnostic xray. FINDINGS: MEDIASTINUM: Mild coronary artery calcification. Mild cardiomegaly. No pericardial effusion. The central airways are clear. No central obstructing lesion. LYMPH NODES: No mediastinal, hilar or axillary lymphadenopathy. LUNGS AND PLEURA: Small bilateral pleural effusions. Diffuse ground-glass pulmonary infiltrate is noted with associated smooth and lobular septal thickening, best appreciated within the lung bases. These findings are consistent with small to moderate interstitial and alveolar pulmonary edema. No pneumothorax. SOFT TISSUES/BONES: Diffuse subcutaneous body wall edema consistent with anasarca and/or cardiogenic failure. No acute abnormality of the bones. UPPER ABDOMEN: Limited images of the upper abdomen demonstrates mild ascites. IMPRESSION: 1. Mild cardiomegaly and mild coronary artery calcification. 2. Small to moderate pulmonary edema. Small bilateral pleural effusions. Mild ascites and diffuse subcutaneous body wall edema, consistent with anasarca and/or cardiogenic failure. Electronically signed by: Dorethia Molt  MD 04/17/2024 01:16 AM EST RP Workstation: [id number]   EEG adult Result Date: 04/15/2024 Shelton Arlin KIDD, MD     04/15/2024  5:38 PM Patient Name: Ashley Freeman MRN: [id number] Epilepsy Attending: Arlin KIDD Shelton Referring Physician/Provider: Perri DELENA Meliton Mickey., MD Date: 04/15/2024 Duration: 24.20 mins Patient history: 33yo female with seizure. EEG to evaluate for seizure. Level of alertness: Awake AEDs during EEG study: LEV, LTG Technical aspects: This EEG study was done with scalp electrodes positioned according to the 10-20 International system of electrode placement. Electrical activity was reviewed with band pass filter of 1-70Hz , sensitivity of 7 uV/mm, display speed of 1mm/sec with [place] 60Hz  notched filter applied as appropriate. EEG data were recorded continuously and digitally stored.  Video monitoring was available and reviewed as appropriate. Description: The posterior dominant rhythm consists of 8 Hz activity of moderate voltage (25-35 uV) seen predominantly in posterior head regions, symmetric and reactive to eye opening and eye closing. Physiologic photic driving was not seen during photic stimulation. Hyperventilation was not performed.   Of note, parts of study were difficult due to significant electrode and movement artifact. IMPRESSION: This study is within normal limits. No seizures or epileptiform discharges were seen throughout the recording. Ansley Mangiapane normal interictal EEG does not exclude the diagnosis of epilepsy. Priyanka O Yadav        Scheduled Meds:  carvedilol   25 mg Oral BID WC   Chlorhexidine  Gluconate Cloth  6 each Topical Q0600   DULoxetine   20 mg Oral Daily   heparin  injection (subcutaneous)  5,000 Units Subcutaneous Q8H   insulin  aspart  0-6 Units Subcutaneous Q4H   insulin  glargine  5 Units Subcutaneous Daily   lamoTRIgine   25 mg Oral Daily   Followed by   NOREEN ON 04/28/2024] lamoTRIgine   50 mg Oral Daily   Followed by   NOREEN ON 05/12/2024]  lamoTRIgine   100 mg Oral Daily   Followed by   NOREEN ON 05/19/2024] lamoTRIgine   100 mg Oral Daily   [START ON 05/19/2024] lamoTRIgine   25 mg Oral QHS   Followed by   NOREEN ON 05/26/2024] lamoTRIgine   50 mg Oral QHS   Followed by   NOREEN ON 06/02/2024] lamoTRIgine   100 mg Oral QHS   levETIRAcetam   500 mg Oral Daily   And   levETIRAcetam   250 mg Oral Q T,Th,Sa-HD   OLANZapine   5 mg Oral QHS   pantoprazole   40 mg Oral BID   sacubitril -valsartan   1 tablet Oral BID   Continuous Infusions:  cefTRIAXone  (ROCEPHIN )  IV 1 g (04/16/24 2205)  LOS: 4 days    Time spent: over 30 min    Meliton Monte, MD [facility]   To contact the attending provider between 7A-7P or the covering provider during after hours 7P-7A, please log into the web site [email or web address] and access using universal [place] password for that web site. If you do not have the password, please call the hospital operator.  04/17/2024, 4:36 PM    "

## 2024-04-17 NOTE — Progress Notes (Signed)
 Received patient in bed to unit.  Alert and oriented.  Informed consent signed and in chart.   TX duration:  Patient tolerated well.  Transported back to the room  Alert, without acute distress.  Hand-off given to patient's nurse.   Access used: R. AVG Access issues: N/A  Total UF removed: 5000 Medication(s) given: N/A Post HD VS:  BP 171/96, HR 85, Temp 98.2 Post HD weight: 54.1kg  04/17/24 1209  Vitals  Temp 98.2 F (36.8 C)  Temp Source Oral  BP (!) 171/96  MAP (mmHg) 115  BP Location Left Arm  BP Method Automatic  Patient Position (if appropriate) Lying  Pulse Rate 85  Pulse Rate Source Monitor  ECG Heart Rate 90  Resp (!) 26  Oxygen Therapy  SpO2 99 %  O2 Device Room Air  Patient Activity (if Appropriate) In bed  Pulse Oximetry Type Continuous  Oximetry Probe Site Changed No  During Treatment Monitoring  Blood Flow Rate (mL/min) 400 mL/min  Arterial Pressure (mmHg) -187.87 mmHg  Venous Pressure (mmHg) 249.28 mmHg  TMP (mmHg) 7.27 mmHg  Ultrafiltration Rate (mL/min) 1683 mL/min  Dialysate Flow Rate (mL/min) 300 ml/min  Dialysate Potassium Concentration 3  Dialysate Calcium  Concentration 2.5  Duration of HD Treatment -hour(s) 3.5 hour(s)  Cumulative Fluid Removed (mL) per Treatment  5000.32  HD Safety Checks Performed Yes  Intra-Hemodialysis Comments Tx completed;Tolerated well  Post Treatment  Dialyzer Clearance Lightly streaked  Hemodialysis Intake (mL) 0 mL  Fistula / Graft Right Upper arm Arteriovenous vein graft  Placement Date/Time: 07/06/22 1013   Placed prior to admission: No  Orientation: Right  Access Location: Upper arm  Access Type: Arteriovenous vein graft  Site Condition No complications  Fistula / Graft Assessment Present;Thrill;Bruit     Carlyon Rhein, RN [facility]

## 2024-04-17 NOTE — Progress Notes (Signed)
 Returned from dialysis

## 2024-04-17 NOTE — Progress Notes (Signed)
 Leaving the floor for dialysis.

## 2024-04-17 NOTE — Progress Notes (Signed)
 " [facility] Progress Note   Subjective:   Seen in KDU - on HD with 5L UFG, tolerating. BP remains high. Denies CP today.  Objective Vitals:   04/17/24 0930 04/17/24 1000 04/17/24 1030 04/17/24 1100  BP: (!) 169/88 (!) 172/88 (!) 169/85 (!) 167/79  Pulse: 81 81 84 84  Resp: (!) 22 18 18 16   Temp:      TempSrc:      SpO2: 99% 99% 98% 100%  Weight:      Height:       Physical Exam General: Chronically ill appearing, NAD. Room air Heart: RRR Lungs: CTA anteriorly Abdomen: mildly tender, soft Extremities: no LE edema Dialysis Access: R AVF +t/b  Additional Objective Labs: Basic Metabolic Panel: Recent Labs  Lab 04/14/24 2257 04/15/24 0850 04/17/24 0830  NA 126* 127* 138  K 3.8 3.6 3.9  CL 96* 97* 106  CO2 16* 19* 19*  GLUCOSE 75 55* 281*  BUN 26* 29* 33*  CREATININE 6.19* 6.47* 6.23*  CALCIUM  7.6* 7.5* 8.1*  PHOS 5.0* 5.4* 5.5*   Liver Function Tests: Recent Labs  Lab 04/14/24 2257 04/15/24 0850 04/17/24 0830  AST 258* 320* 203*  ALT 66* 88* 73*  ALKPHOS 461* 464* 475*  BILITOT 0.5 0.4 0.3  PROT 4.0* 3.9* 4.7*  ALBUMIN  2.6* 2.6* 3.2*   CBC: Recent Labs  Lab 04/13/24 1531 04/13/24 1754 04/14/24 2257 04/15/24 0850 04/17/24 0830  WBC 7.3  --  8.7 8.5 7.3  NEUTROABS 4.5  --  2.3 3.0 3.0  HGB 11.9*   < > 12.5 12.4 9.6*  HCT 40.8   < > 38.4 38.3 31.5*  MCV 91.7  --  82.6 83.3 87.7  PLT 178  --  182 174 165   < > = values in this interval not displayed.   Blood Culture    Component Value Date/Time   SDES BLOOD LEFT HAND 04/13/2024 1746   SPECREQUEST  04/13/2024 1746    BOTTLES DRAWN AEROBIC AND ANAEROBIC Blood Culture results may not be optimal due to an inadequate volume of blood received in culture bottles   CULT  04/13/2024 1746    NO GROWTH 4 DAYS Performed at [facility], [address], [place], [place] [address]    REPTSTATUS PENDING 04/13/2024 1746   Studies/Results: CT CHEST WO CONTRAST Result Date:  04/17/2024 EXAM: CT CHEST WITHOUT CONTRAST 04/16/2024 08:37:00 PM TECHNIQUE: CT of the chest was performed without the administration of intravenous contrast. Multiplanar reformatted images are provided for review. Automated exposure control, iterative reconstruction, and/or weight based adjustment of the mA/kV was utilized to reduce the radiation dose to as low as reasonably achievable. COMPARISON: None available. CLINICAL HISTORY: Respiratory illness, nondiagnostic xray. FINDINGS: MEDIASTINUM: Mild coronary artery calcification. Mild cardiomegaly. No pericardial effusion. The central airways are clear. No central obstructing lesion. LYMPH NODES: No mediastinal, hilar or axillary lymphadenopathy. LUNGS AND PLEURA: Small bilateral pleural effusions. Diffuse ground-glass pulmonary infiltrate is noted with associated smooth and lobular septal thickening, best appreciated within the lung bases. These findings are consistent with small to moderate interstitial and alveolar pulmonary edema. No pneumothorax. SOFT TISSUES/BONES: Diffuse subcutaneous body wall edema consistent with anasarca and/or cardiogenic failure. No acute abnormality of the bones. UPPER ABDOMEN: Limited images of the upper abdomen demonstrates mild ascites. IMPRESSION: 1. Mild cardiomegaly and mild coronary artery calcification. 2. Small to moderate pulmonary edema. Small bilateral pleural effusions. Mild ascites and diffuse subcutaneous body wall edema, consistent with anasarca and/or cardiogenic  failure. Electronically signed by: Dorethia Molt MD 04/17/2024 01:16 AM EST RP Workstation: [id number]   EEG adult Result Date: 04/15/2024 Shelton Arlin KIDD, MD     04/15/2024  5:38 PM Patient Name: Ashley Freeman MRN: [id number] Epilepsy Attending: Arlin KIDD Shelton Referring Physician/Provider: Perri DELENA Meliton Mickey., MD Date: 04/15/2024 Duration: 24.20 mins Patient history: 33yo female with seizure. EEG to evaluate for seizure. Level of  alertness: Awake AEDs during EEG study: LEV, LTG Technical aspects: This EEG study was done with scalp electrodes positioned according to the 10-20 International system of electrode placement. Electrical activity was reviewed with band pass filter of 1-70Hz , sensitivity of 7 uV/mm, display speed of 32mm/sec with a 60Hz  notched filter applied as appropriate. EEG data were recorded continuously and digitally stored.  Video monitoring was available and reviewed as appropriate. Description: The posterior dominant rhythm consists of 8 Hz activity of moderate voltage (25-35 uV) seen predominantly in posterior head regions, symmetric and reactive to eye opening and eye closing. Physiologic photic driving was not seen during photic stimulation. Hyperventilation was not performed.   Of note, parts of study were difficult due to significant electrode and movement artifact. IMPRESSION: This study is within normal limits. No seizures or epileptiform discharges were seen throughout the recording. A normal interictal EEG does not exclude the diagnosis of epilepsy. Priyanka O Yadav   Medications:  cefTRIAXone  (ROCEPHIN )  IV 1 g (04/16/24 2205)    carvedilol   25 mg Oral BID WC   Chlorhexidine  Gluconate Cloth  6 each Topical Q0600   DULoxetine   20 mg Oral Daily   heparin  injection (subcutaneous)  5,000 Units Subcutaneous Q8H   insulin  aspart  0-6 Units Subcutaneous Q4H   insulin  glargine  5 Units Subcutaneous Daily   lamoTRIgine   25 mg Oral Daily   Followed by   NOREEN ON 04/28/2024] lamoTRIgine   50 mg Oral Daily   Followed by   NOREEN ON 05/12/2024] lamoTRIgine   100 mg Oral Daily   Followed by   NOREEN ON 05/19/2024] lamoTRIgine   100 mg Oral Daily   [START ON 05/19/2024] lamoTRIgine   25 mg Oral QHS   Followed by   NOREEN ON 05/26/2024] lamoTRIgine   50 mg Oral QHS   Followed by   NOREEN ON 06/02/2024] lamoTRIgine   100 mg Oral QHS   levETIRAcetam   500 mg Oral Daily   And   levETIRAcetam   250 mg Oral Q T,Th,Sa-HD    OLANZapine   5 mg Oral QHS   pantoprazole   40 mg Oral BID   sacubitril -valsartan   1 tablet Oral BID    Dialysis Orders [facility] [facility] [address] [phone]) 3:45hr, 400/800, EDW 57.5kg, 2K/2.5Ca, R AVG Heparin  bolus 1600 units with HD Calcitriol  1mcg - last 12/13 Sensipar  60mg  with HD - last 12/13 TUMS 1500mg  with HD - last 12/13    Assessment/Plan: Witnessed seizure - likely 2nd uncontrolled DM; neurology consulted; on Keppra . T1DM/DKA - Per primary Possible PNA - On Levaquin  for now, per primary ESRD - usual TTS schedule - for extra HD today, then resume usual schedule tomorrow. Hypertension/volume  - another 5L off today, should be at or below dry weight. Anemia of CKD - Hgb 12.4 -> 9.6. No signs of bleeding. Resume ESA as needed. Secondary Hyperparathyroidism - Ca/Phos ok - continue home meds. Bipolar - On Cymbalta  and Zyprexa  Nutrition - Renal diet with fluid restriction Dispo - Recommend she resume HD again on Saturday in outpatient to be back on her routine schedule. Ok for discharge from  renal standpoint after HD today.   Izetta Boehringer, PA-C 04/17/2024, 11:47 AM  [facility]    "

## 2024-04-17 NOTE — Progress Notes (Signed)
 [facility] night cross cover note:   I was notified by the patient's RN of the patient's evening CBG result of 433.  She will receive a total of 6 units of sq NovoLog  now.  I have requested repeat CBG to occur in about 1 hour, and requested to be notified if this ensuing CBG result remains greater than 400.     Eva Pore, DO Hospitalist

## 2024-04-17 NOTE — Plan of Care (Signed)
 Dialysis today. Removal of 5 liters. 54.1 KG per dialysis nurse post treatment. Remain here for another day to get CBG's under control. Sleeping when returned from treatment/dialysis.    Problem: Education: Goal: Knowledge of General Education information will improve Description: Including pain rating scale, medication(s)/side effects and non-pharmacologic comfort measures Outcome: Progressing   Problem: Pain Managment: Goal: General experience of comfort will improve and/or be controlled Outcome: Progressing   Problem: Safety: Goal: Ability to remain free from injury will improve Outcome: Progressing   Problem: Skin Integrity: Goal: Risk for impaired skin integrity will decrease Outcome: Progressing

## 2024-04-17 NOTE — Progress Notes (Signed)
 CBG of 469. Dr JAYSON Monte notified. Order to give 6 units SSI per order and he came to the floor to tell her she will be staying another night.

## 2024-04-18 ENCOUNTER — Other Ambulatory Visit (HOSPITAL_COMMUNITY): Payer: Self-pay

## 2024-04-18 DIAGNOSIS — R569 Unspecified convulsions: Secondary | ICD-10-CM | POA: Diagnosis not present

## 2024-04-18 LAB — GLUCOSE, CAPILLARY
Glucose-Capillary: 135 mg/dL — ABNORMAL HIGH (ref 70–99)
Glucose-Capillary: 145 mg/dL — ABNORMAL HIGH (ref 70–99)
Glucose-Capillary: 202 mg/dL — ABNORMAL HIGH (ref 70–99)
Glucose-Capillary: 295 mg/dL — ABNORMAL HIGH (ref 70–99)
Glucose-Capillary: 375 mg/dL — ABNORMAL HIGH (ref 70–99)

## 2024-04-18 LAB — CULTURE, BLOOD (ROUTINE X 2): Culture: NO GROWTH

## 2024-04-18 LAB — BASIC METABOLIC PANEL WITH GFR
Anion gap: 12 (ref 5–15)
BUN: 25 mg/dL — ABNORMAL HIGH (ref 6–20)
CO2: 24 mmol/L (ref 22–32)
Calcium: 8.3 mg/dL — ABNORMAL LOW (ref 8.9–10.3)
Chloride: 106 mmol/L (ref 98–111)
Creatinine, Ser: 4.57 mg/dL — ABNORMAL HIGH (ref 0.44–1.00)
GFR, Estimated: 12 mL/min — ABNORMAL LOW
Glucose, Bld: 87 mg/dL (ref 70–99)
Potassium: 4.1 mmol/L (ref 3.5–5.1)
Sodium: 143 mmol/L (ref 135–145)

## 2024-04-18 MED ORDER — LIDOCAINE-PRILOCAINE 2.5-2.5 % EX CREA: 1.0000 | TOPICAL_CREAM | CUTANEOUS | Status: DC | PRN

## 2024-04-18 MED ORDER — LEVETIRACETAM 250 MG PO TABS
ORAL_TABLET | ORAL | 0 refills | Status: DC
  Filled 2024-04-18: qty 102, 42d supply, fill #0

## 2024-04-18 MED ORDER — ALTEPLASE 2 MG IJ SOLR: 2.0000 mg | Freq: Once | INTRAMUSCULAR | Status: DC | PRN

## 2024-04-18 MED ORDER — LIDOCAINE HCL (PF) 1 % IJ SOLN: 5.0000 mL | INTRAMUSCULAR | Status: DC | PRN

## 2024-04-18 MED ORDER — SACUBITRIL-VALSARTAN 24-26 MG PO TABS
1.0000 | ORAL_TABLET | Freq: Two times a day (BID) | ORAL | 0 refills | Status: DC
  Filled 2024-04-18: qty 60, 30d supply, fill #0

## 2024-04-18 MED ORDER — PENTAFLUOROPROP-TETRAFLUOROETH EX AERO: 1.0000 | INHALATION_SPRAY | CUTANEOUS | Status: DC | PRN

## 2024-04-18 MED ORDER — LANTUS SOLOSTAR 100 UNIT/ML ~~LOC~~ SOPN: 5.0000 [IU] | PEN_INJECTOR | Freq: Every day | SUBCUTANEOUS | Status: AC

## 2024-04-18 MED ORDER — LAMOTRIGINE 25 MG PO TABS
ORAL_TABLET | ORAL | 0 refills | Status: DC
  Filled 2024-04-18: qty 101, 40d supply, fill #0

## 2024-04-18 MED ORDER — CARVEDILOL 25 MG PO TABS
25.0000 mg | ORAL_TABLET | Freq: Two times a day (BID) | ORAL | 0 refills | Status: DC
  Filled 2024-04-18: qty 60, 30d supply, fill #0

## 2024-04-18 MED ORDER — HEPARIN SODIUM (PORCINE) 1000 UNIT/ML DIALYSIS: 1000.0000 [IU] | INTRAMUSCULAR | Status: DC | PRN

## 2024-04-18 MED ORDER — CHLORHEXIDINE GLUCONATE CLOTH 2 % EX PADS
6.0000 | MEDICATED_PAD | Freq: Every day | CUTANEOUS | Status: DC
  Administered 2024-04-19: 6 via TOPICAL

## 2024-04-18 NOTE — Discharge Summary (Signed)
 Physician Discharge Summary  Ashley Freeman FMW:[id number] DOB: [birth year] DOA: 04/13/2024  PCP: Keven Crumbly Pap, MD  Admit date: 04/13/2024 Discharge date: 04/18/2024  Time spent: 40 minutes  Recommendations for Outpatient Follow-up:  Follow outpatient CBC/CMP  Continue to work on blood sugar control Follow with neurology outpatient for seizures  Volume per renal Mild eosinophilia, follow outpatient Ensure adherence to meds  Discharge Diagnoses:  Principal Problem:   Seizure [facility])   Discharge Condition: stable  Diet recommendation: diabetic  Filed Weights   04/17/24 1209 04/18/24 1500 04/18/24 1540  Weight: 54.1 kg 56.5 kg 53.6 kg    History of present illness:   Ashley Freeman is Ashley Freeman 33 y.o. female with medical history significant of bipolar, type 1 diabetes, ESRD, heart failure reduced ejection fraction, hypertension, hyperlipidemia who Emergency Department after witnessed seizure.  Patient was just discharged from the hospital on 12/10 after being admitted with DKA and recurrent ascites.    She was admitted for DKA and seizure in setting of hyperglycemia.  She was treated for pneumonia.    Seen by neurology who recommended titrating lamictal  back up, then stopping keppra .   Received dialysis 12/20 prior to discharge.    She's gradually improved and is stable for discharge.   Hospital Course:  Assessment and Plan:  DKA Uncontrolled T1DM with hyperglycemia and hypoglycemia Unclear precipitating cause - she notes compliance with her insulin  regimen CXR with findings concerning for RLL pneumonia Blood cultures NGx2 UA contaminated, not c/w UTI Basal, SSI - follow outpatient   Seizure Disorder In setting of above Sounds like she's had seizures before  Head CT without acute abnormalities Neurology recommending keppra  while lamictal  Continue keppra    Per [place]  DMV statutes, patients with seizures are not allowed to drive  until  they have been seizure-free for six months. Use caution when using heavy equipment or power tools. Avoid working on ladders or at heights. Take showers instead of baths. Ensure the water  temperature is not too high on the home water  heater. Do not go swimming alone. When caring for infants or small children, sit down when holding, feeding, or changing them to minimize risk of injury to the child in the event you have Ashley Freeman seizure. Also, Maintain good sleep hygiene. Avoid alcohol .   Possible Pneumonia Chronic Cough Treating with ceftriaxone  (per pharmacy, has tolerated this in the past despite documented cephalosporin allergy) Will stop abx at discharge as CT more c/w edema/volume overload, normal WBC count and afebrile Urine strep, urine legionella, blood culture above CT chest with small to moderate pulm edema, small bilateral pleural effusions, mild ascites and diffuse body wall edema - manage volume with dialysis   Elevated Liver Enzymes Shock liver? Vs related to volume overload? Negative hepatitis panel from 10/2023 Will trend - hold statin   Eosinophilia Unclear cause - will monitor - warrants outpatient follow up  ESRD Renal c/s for dialysis   Hypertension Coreg  Holding bumex  for now  HFrEF Entresto  Coreg  Bumex  on hold Volume per renal    Bipolar Disorder Cymbalta  zyprexa  Per neurology, recommending gradual uptitration of lamictal . Discussed with her the importance of compliance with this medication.     Dyslipidemia Crestor     Procedures:    Consultations: nephrology  Discharge Exam: Vitals:   04/18/24 1830 04/18/24 1900  BP: (!) 171/80 (!) 172/85  Pulse: 86 81  Resp: 19 18  Temp:    SpO2: 100% 100%   Eager to discharge today  General: No acute distress. Cardiovascular:  RRR Lungs: unlabored Abdomen: Soft, nontender, nondistended  Neurological: sleepy. Moves all extremities 4. Cranial nerves II through XII grossly intact. Extremities: No  clubbing or cyanosis. No edema.   Discharge Instructions   Discharge Instructions     Call MD for:  difficulty breathing, headache or visual disturbances   Complete by: As directed    Call MD for:  extreme fatigue   Complete by: As directed    Call MD for:  hives   Complete by: As directed    Call MD for:  persistant dizziness or light-headedness   Complete by: As directed    Call MD for:  persistant nausea and vomiting   Complete by: As directed    Call MD for:  redness, tenderness, or signs of infection (pain, swelling, redness, odor or green/yellow discharge around incision site)   Complete by: As directed    Call MD for:  severe uncontrolled pain   Complete by: As directed    Call MD for:  temperature >100.4   Complete by: As directed    Discharge instructions   Complete by: As directed    You were seen for DKA.  You were treated for pneumonia with [name] short course of antibiotics.   Your CT scan of your chest is more suggestive of just extra fluid which will be managed by dialysis.  You had Ashley Freeman seizure.  We've resumed your lamictal .  It's important that you titrate this medicine gradually as prescribed.  We've also started you on keppra .  Continue this this as prescribed.  Per [place]  DMV statutes, patients with seizures are not allowed to drive until  they have been seizure-free for six months. Use caution when using heavy equipment or power tools. Avoid working on ladders or at heights. Take showers instead of baths. Ensure the water  temperature is not too high on the home water  heater. Do not go swimming alone. When caring for infants or small children, sit down when holding, feeding, or changing them to minimize risk of injury to the child in the event you have Ashley Freeman seizure. Also, Maintain good sleep hygiene. Avoid alcohol .  Your liver enzymes are abnormal.  Hold your crestor  until you get repeat labs with your PCP outpatient.  You have some mildly elevated white blood  cells called eosinophils.  Follow this up with your outpatient provider.  Return for new, recurrent, or worsening symptoms.  Please ask your PCP to request records from this hospitalization so they know what was done and what the next steps will be.   Increase activity slowly   Complete by: As directed    No wound care   Complete by: As directed       Allergies as of 04/18/2024       Reactions   Keflex  [cephalexin ] Anaphylaxis   Ceftriaxone  in the past with no reaction   Penicillins Anaphylaxis, Hives, Rash   Vibramycin  [doxycycline ] Anaphylaxis   Benadryl  [diphenhydramine ] Itching   Dilaudid  [hydromorphone ] Itching   Methotrexate And Trimetrexate Rash   Roxicodone  [oxycodone ] Itching   Takes Percocet without issue        Medication List     PAUSE taking these medications    rosuvastatin  40 MG tablet Wait to take this until your doctor or other care provider tells you to start again. Follow repeat liver function tests with your PCP before resuming this Commonly known as: CRESTOR  Take 40 mg by mouth daily.       TAKE these medications  albuterol  108 (90 Base) MCG/ACT inhaler Commonly known as: VENTOLIN  HFA Inhale 2 puffs into the lungs every 4 (four) hours as needed for wheezing or shortness of breath.   bumetanide  2 MG tablet Commonly known as: BUMEX  Take 10 mg by mouth daily.   carvedilol  25 MG tablet Commonly known as: COREG  Take 1 tablet (25 mg total) by mouth 2 (two) times daily with Ashley Freeman meal. Follow with your PCP for refills. What changed: additional instructions   Cinnamon 500 MG capsule Take 500 mg by mouth in the morning.   DULoxetine  20 MG capsule Commonly known as: CYMBALTA  Take 20 mg by mouth daily.   ELDERBERRY IMMUNE HEALTH GUMMY PO Take 2 each by mouth in the morning.   Entresto  24-26 MG Generic drug: sacubitril -valsartan  Take 1 tablet by mouth 2 (two) times daily. Follow with your PCP/outpatient provider for refills What changed:  additional instructions   fluticasone  50 MCG/ACT nasal spray Commonly known as: FLONASE  Place 2 sprays into both nostrils daily as needed for allergies or rhinitis.   hydrOXYzine  25 MG tablet Commonly known as: ATARAX  Take 25 mg by mouth 3 (three) times daily as needed for anxiety, itching, nausea or vomiting.   lamoTRIgine  25 MG tablet Commonly known as: LaMICtal  Take 25 mg (1 tablet) daily for 10 days.   Then take 50 mg (2 tablets) daily for 14 days.   Then take 100 mg (4 tablets) daily for 7 days.   Then take 100 mg (4 tablets) in the morning and 25 mg (1 tablet) at night for 7 days.   Then take 100 mg (4 tablets) in the morning and 50 mg (2 tablets) at night for 7 days.   Then take 100 mg (4 tablets) twice Ashley Freeman day.  Continue this dose.  Follow with your PCP for refills. What changed:  medication strength how much to take how to take this when to take this additional instructions   Lantus  SoloStar 100 UNIT/ML Solostar Pen Generic drug: insulin  glargine Inject 5 Units into the skin daily. What changed: how much to take   levETIRAcetam  250 MG tablet Commonly known as: KEPPRA  Take 2 tablets (500 mg total) by mouth every morning AND 1 tablet (250 mg total) every Tuesday, Thursday, and Saturday at 6 PM. (After dialysis).   NovoLOG  FlexPen 100 UNIT/ML FlexPen Generic drug: insulin  aspart Before each meal 3 times Ashley Freeman day, 140-199 - 2 units, 200-250 - 4 units, 251-299 - 6 units,  300-349 - 7 units,  350 or above 8 units.   OLANZapine  5 MG tablet Commonly known as: ZYPREXA  Take 5 mg by mouth at bedtime.   Pancrelipase  (Lip-Prot-Amyl) 3000-9500 units Cpep Take 3,000 Units by mouth in the morning, at noon, and at bedtime.   pantoprazole  40 MG tablet Commonly known as: PROTONIX  Take 1 tablet (40 mg total) by mouth 2 (two) times daily.   prochlorperazine  5 MG tablet Commonly known as: COMPAZINE  Take 5 mg by mouth every 6 (six) hours as needed for nausea or vomiting.   sevelamer   carbonate 800 MG tablet Commonly known as: RENVELA  Take 1 tablet (800 mg total) by mouth 3 (three) times daily with meals.   sodium bicarbonate  650 MG tablet Take 650 mg by mouth 2 (two) times daily.   SUMAtriptan  50 MG tablet Commonly known as: IMITREX  Take 50 mg by mouth every 2 (two) hours as needed for migraine or headache.   Veltassa  16.8 g Pack Generic drug: Patiromer  Sorbitex Calcium  Take 1 Dose by mouth  daily at 12 noon.       Allergies[1]    The results of significant diagnostics from this hospitalization (including imaging, microbiology, ancillary and laboratory) are listed below for reference.    Significant Diagnostic Studies: CT CHEST WO CONTRAST Result Date: 04/17/2024 EXAM: CT CHEST WITHOUT CONTRAST 04/16/2024 08:37:00 PM TECHNIQUE: CT of the chest was performed without the administration of intravenous contrast. Multiplanar reformatted images are provided for review. Automated exposure control, iterative reconstruction, and/or weight based adjustment of the mA/kV was utilized to reduce the radiation dose to as low as reasonably achievable. COMPARISON: None available. CLINICAL HISTORY: Respiratory illness, nondiagnostic xray. FINDINGS: MEDIASTINUM: Mild coronary artery calcification. Mild cardiomegaly. No pericardial effusion. The central airways are clear. No central obstructing lesion. LYMPH NODES: No mediastinal, hilar or axillary lymphadenopathy. LUNGS AND PLEURA: Small bilateral pleural effusions. Diffuse ground-glass pulmonary infiltrate is noted with associated smooth and lobular septal thickening, best appreciated within the lung bases. These findings are consistent with small to moderate interstitial and alveolar pulmonary edema. No pneumothorax. SOFT TISSUES/BONES: Diffuse subcutaneous body wall edema consistent with anasarca and/or cardiogenic failure. No acute abnormality of the bones. UPPER ABDOMEN: Limited images of the upper abdomen demonstrates mild ascites.  IMPRESSION: 1. Mild cardiomegaly and mild coronary artery calcification. 2. Small to moderate pulmonary edema. Small bilateral pleural effusions. Mild ascites and diffuse subcutaneous body wall edema, consistent with anasarca and/or cardiogenic failure. Electronically signed by: Dorethia Molt MD 04/17/2024 01:16 AM EST RP Workstation: [id number]   EEG adult Result Date: 04/15/2024 Ashley Arlin KIDD, MD     04/15/2024  5:38 PM Patient Name: BRAELEY BUSKEY MRN: [id number] Epilepsy Attending: Arlin Freeman Ashley Referring Physician/Provider: Perri DELENA Meliton Mickey., MD Date: 04/15/2024 Duration: 24.20 mins Patient history: 33yo female with seizure. EEG to evaluate for seizure. Level of alertness: Awake AEDs during EEG study: LEV, LTG Technical aspects: This EEG study was done with scalp electrodes positioned according to the 10-20 International system of electrode placement. Electrical activity was reviewed with band pass filter of 1-70Hz , sensitivity of 7 uV/mm, display speed of 81mm/sec with Mikisha Roseland 60Hz  notched filter applied as appropriate. EEG data were recorded continuously and digitally stored.  Video monitoring was available and reviewed as appropriate. Description: The posterior dominant rhythm consists of 8 Hz activity of moderate voltage (25-35 uV) seen predominantly in posterior head regions, symmetric and reactive to eye opening and eye closing. Physiologic photic driving was not seen during photic stimulation. Hyperventilation was not performed.   Of note, parts of study were difficult due to significant electrode and movement artifact. IMPRESSION: This study is within normal limits. No seizures or epileptiform discharges were seen throughout the recording. Anton Cheramie normal interictal EEG does not exclude the diagnosis of epilepsy. Arlin Freeman Ashley   DG Chest Port 1 View Result Date: 04/13/2024 EXAM: 1 VIEW(S) XRAY OF THE CHEST 04/13/2024 04:21:00 PM COMPARISON: 04/06/2024 CLINICAL HISTORY: cough  FINDINGS: LINES, TUBES AND DEVICES: Right axillary stent. LUNGS AND PLEURA: Asymmetric interstitial/perihilar prominence, right lung predominant, favoring asymmetric interstitial edema over multifocal infection. Superimposed right lower lobe opacity favors pneumonia over atelectasis. Small bilateral pleural effusions, layering on the right. No pneumothorax. HEART AND MEDIASTINUM: Mild cardiomegaly is improved. BONES AND SOFT TISSUES: No acute osseous abnormality. IMPRESSION: 1. Suspected asymmetric interstitial edema, right lung predominant, less likely multifocal infection. 2. Superimposed right lower lobe opacity, favoring pneumonia over atelectasis. 3. Small bilateral pleural effusions, layering on the right. Electronically signed by: Pinkie Pebbles MD 04/13/2024 05:04 PM EST RP Workstation: [id number]  CT Head Wo Contrast Result Date: 04/13/2024 EXAM: CT HEAD WITHOUT 04/13/2024 04:33:32 PM TECHNIQUE: CT of the head was performed without the administration of intravenous contrast. Automated exposure control, iterative reconstruction, and/or weight based adjustment of the mA/kV was utilized to reduce the radiation dose to as low as reasonably achievable. COMPARISON: 11/11/2023 CLINICAL HISTORY: Seizure, new-onset, no history of trauma FINDINGS: BRAIN AND VENTRICLES: No acute intracranial hemorrhage. No mass effect or midline shift. No extra-axial fluid collection. No evidence of acute infarct. No hydrocephalus. Atherosclerosis of the carotid siphons and intracranial vertebral arteries. ORBITS: Bilateral lens replacement. SINUSES AND MASTOIDS: Mild mucosal thickening in the paranasal sinuses. SOFT TISSUES AND SKULL: No acute skull fracture. There is diffuse stranding of the facial subcutaneous fat bilaterally which could reflect edema or anasarca. IMPRESSION: 1. No acute intracranial abnormality. 2. Skull base atherosclerosis greater than expected for age. 3. There is diffuse stranding of the facial  subcutaneous fat bilaterally which could reflect edema/anasarca. Electronically signed by: Donnice Mania MD 04/13/2024 04:42 PM EST RP Workstation: [id number]   IR Paracentesis Result Date: 04/06/2024 INDICATION: 33 year old female with history of ESRD, on HD, with recurrent ascites. IR is requested for diagnostic and therapeutic paracentesis. EXAM: ULTRASOUND GUIDED DIAGNOSTIC AND THERAPEUTIC PARACENTESIS MEDICATIONS: 5 cc of 1% lidocaine . COMPLICATIONS: None immediate. PROCEDURE: Informed written consent was obtained from the patient after Zelma Mazariego discussion of the risks, benefits and alternatives to treatment. Diar Berkel timeout was performed prior to the initiation of the procedure. Initial ultrasound scanning demonstrates Joban Colledge large amount of ascites within the right lower abdominal quadrant. The right lower abdomen was prepped and draped in the usual sterile fashion. 1% lidocaine  was used for local anesthesia. Following this, Keygan Dumond 19 gauge, 7-cm, Yueh catheter was introduced. An ultrasound image was saved for documentation purposes. The paracentesis was performed. The catheter was removed and Lakin Romer dressing was applied. The patient tolerated the procedure well without immediate post procedural complication. FINDINGS: Dhanvi Boesen total of approximately 3.5 L of clear, straw-colored fluid was removed. Samples were sent to the laboratory as requested by the clinical team. IMPRESSION: Successful ultrasound-guided paracentesis yielding 3.5 liters of peritoneal fluid. Performed by: Carlin Griffon, PA-C Electronically Signed   By: Wilkie Lent M.D.   On: 04/06/2024 14:59   DG Chest Port 1 View Result Date: 04/06/2024 CLINICAL DATA:  Sepsis. EXAM: PORTABLE CHEST 1 VIEW COMPARISON:  03/28/2024 FINDINGS: The cardio pericardial silhouette is enlarged. Focal consolidative opacity at the right base with pleural effusion. Low lung volumes with vascular congestion. No acute bony abnormality. Telemetry leads overlie the chest. IMPRESSION: Focal  consolidative opacity at the right base with pleural effusion. Imaging features compatible with pneumonia. Follow-up imaging recommended to ensure resolution. Low lung volumes with cardiomegaly. Electronically Signed   By: Camellia Candle M.D.   On: 04/06/2024 10:23   DG Chest Portable 1 View Result Date: 03/28/2024 CLINICAL DATA:  History of CHF.  Hypoglycemia. EXAM: PORTABLE CHEST 1 VIEW COMPARISON:  Chest x-ray 03/24/2024 FINDINGS: The heart is enlarged. Lung volumes are low. There is no focal lung infiltrate, pleural effusion or pneumothorax. No acute fractures are seen. IMPRESSION: Cardiomegaly. No acute cardiopulmonary process. Electronically Signed   By: Greig Pique M.D.   On: 03/28/2024 22:31   DG Chest Port 1 View Result Date: 03/24/2024 EXAM: 1 VIEW(S) XRAY OF THE CHEST 03/24/2024 06:43:00 PM COMPARISON: 03/23/2024 CLINICAL HISTORY: Shortness of breath FINDINGS: LUNGS AND PLEURA: Improved diffuse airspace opacities. Increased retrocardiac opacity. Small pleural effusions. No pneumothorax. HEART AND MEDIASTINUM: Similar cardiomegaly. Partially  visualized right arm vascular stent. BONES AND SOFT TISSUES: No acute osseous abnormality. IMPRESSION: 1. Improved increased aeration compared to prior radiograph. 2. Increased retrocardiac opacity, atelectasis versus pneumonia . 3. Stable  cardiomegaly with Small pleural effusions. Electronically signed by: Luke Bun MD 03/24/2024 07:08 PM EST RP Workstation: [id number]   IR Paracentesis Result Date: 03/23/2024 INDICATION: History of end-stage renal disease on hemodialysis with recurrent ascites. Request for therapeutic paracentesis. EXAM: ULTRASOUND GUIDED THERAPEUTIC PARACENTESIS MEDICATIONS: 8 mL 1% lidocaine  COMPLICATIONS: None immediate. PROCEDURE: Informed written consent was obtained from the patient after Jameshia Hayashida discussion of the risks, benefits and alternatives to treatment. Dezmen Alcock timeout was performed prior to the initiation of the procedure. Initial  ultrasound scanning demonstrates Rica Heather moderate amount of ascites within the right lower abdominal quadrant. The right lower abdomen was prepped and draped in the usual sterile fashion. 1% lidocaine  was used for local anesthesia. Following this, Indyah Saulnier 19 gauge, 7-cm, Yueh catheter was introduced. An ultrasound image was saved for documentation purposes. The paracentesis was performed. The catheter was removed and Adina Puzzo dressing was applied. The patient tolerated the procedure well without immediate post procedural complication. Patient received post-procedure intravenous albumin ; see nursing notes for details. FINDINGS: Iyanah Demont total of approximately 6.2 liters of hazy yellow fluid was removed. IMPRESSION: Successful ultrasound-guided paracentesis yielding 6.2 liters of peritoneal fluid. Performed by: Wyatt Pommier, PA-C Electronically Signed   By: JONETTA Faes M.D.   On: 03/23/2024 16:25   DG Chest Port 1 View Result Date: 03/23/2024 EXAM: 1 VIEW(S) XRAY OF THE CHEST 03/23/2024 12:30:00 AM COMPARISON: 03/21/2024. CLINICAL HISTORY: SOB (shortness of breath). FINDINGS: LUNGS AND PLEURA: Worsening bilaterally airspace disease could reflect edema or infection. Suspect small right pleural effusion. Low lung volumes. No pneumothorax. HEART AND MEDIASTINUM: Cardiomegaly. BONES AND SOFT TISSUES: No acute osseous abnormality. IMPRESSION: 1. Worsening bilateral airspace disease, possibly reflecting edema or infection. 2. Suspected small right pleural effusion. 3. Cardiomegaly. Electronically signed by: Franky Crease MD 03/23/2024 12:42 AM EST RP Workstation: [id number]   CT ABDOMEN PELVIS WO CONTRAST Result Date: 03/22/2024 EXAM: CT ABDOMEN AND PELVIS WITHOUT CONTRAST 03/22/2024 01:57:31 AM TECHNIQUE: CT of the abdomen and pelvis was performed without the administration of intravenous contrast. Multiplanar reformatted images are provided for review. Automated exposure control, iterative reconstruction, and/or weight-based adjustment of  the mA/kV was utilized to reduce the radiation dose to as low as reasonably achievable. COMPARISON: 03/08/2024 CLINICAL HISTORY: Distended abdomen. FINDINGS: LOWER CHEST: Moderate right-sided pleural effusion is noted, slightly increased when compared with the prior exam. LIVER: The liver is within normal limits. GALLBLADDER AND BILE DUCTS: The gallbladder has been surgically removed. No biliary ductal dilatation. SPLEEN: The spleen is unremarkable. PANCREAS: The pancreas is unremarkable. ADRENAL GLANDS: The adrenal glands are well visualized without acute abnormality. KIDNEYS, URETERS AND BLADDER: The kidneys are well visualized without acute abnormality. Renal vascular calcifications are seen. The bladder is partially distended. No stones in the kidneys or ureters. GI AND BOWEL: Stomach and small bowel are within normal limits. No obstructive or inflammatory changes of the colon are seen. The appendix is not well visualized and may have been surgically removed. No inflammatory changes to suggest appendicitis are seen. PERITONEUM AND RETROPERITONEUM: Moderate to severe ascites is noted, increased when compared with the prior exam. No free air. VASCULATURE: Diffuse vascular calcifications are noted in the visceral vessels. The aorta shows no aneurysmal dilatation. LYMPH NODES: No lymphadenopathy. REPRODUCTIVE ORGANS: The uterus and adnexa are within normal limits. BONES AND SOFT TISSUES: Changes of anasarca  are noted in the subcutaneous tissues. IMPRESSION: 1. Moderate to severe ascites, increased compared to the prior exam. 2. Moderate right pleural effusion, slightly increased compared to the prior exam. Electronically signed by: Oneil Devonshire MD 03/22/2024 02:03 AM EST RP Workstation: [id number]   DG Chest Portable 1 View Result Date: 03/21/2024 EXAM: 1 VIEW(S) XRAY OF THE CHEST 03/21/2024 07:26:00 PM COMPARISON: 03/16/2024 CLINICAL HISTORY: Shortness of breath FINDINGS: LUNGS AND PLEURA: Low lung volumes.  Airspace disease in the right lower lobe concerning for pneumonia. No pleural effusion. No pneumothorax. HEART AND MEDIASTINUM: Cardiomegaly. BONES AND SOFT TISSUES: No acute osseous abnormality. IMPRESSION: 1. Airspace disease in the right lower lobe concerning for pneumonia. 2. Cardiomegaly. Electronically signed by: Franky Crease MD 03/21/2024 07:33 PM EST RP Workstation: [id number]    Microbiology: Recent Results (from the past 240 hours)  Blood culture (routine x 2)     Status: None   Collection Time: 04/13/24  5:46 PM   Specimen: BLOOD LEFT HAND  Result Value Ref Range Status   Specimen Description BLOOD LEFT HAND  Final   Special Requests   Final    BOTTLES DRAWN AEROBIC AND ANAEROBIC Blood Culture results may not be optimal due to an inadequate volume of blood received in culture bottles   Culture   Final    NO GROWTH 5 DAYS Performed at [facility], [address], [place], [place] [address]    Report Status 04/18/2024 FINAL  Final     Labs: Basic Metabolic Panel: Recent Labs  Lab 04/13/24 1531 04/13/24 1748 04/13/24 2108 04/14/24 2257 04/15/24 0850 04/17/24 0830 04/18/24 0613  NA 123*   < > 127* 126* 127* 138 143  K 3.6   < > 3.4* 3.8 3.6 3.9 4.1  CL 88*   < > 96* 96* 97* 106 106  CO2 17*   < > 15* 16* 19* 19* 24  GLUCOSE 1,155*   < > 716* 75 55* 281* 87  BUN 25*   < > 26* 26* 29* 33* 25*  CREATININE 5.95*   < > 5.84* 6.19* 6.47* 6.23* 4.57*  CALCIUM  8.2*   < > 7.9* 7.6* 7.5* 8.1* 8.3*  MG 2.1  --   --  1.8 1.9 2.4  --   PHOS  --   --   --  5.0* 5.4* 5.5*  --    < > = values in this interval not displayed.   Liver Function Tests: Recent Labs  Lab 04/13/24 1531 04/14/24 2257 04/15/24 0850 04/17/24 0830  AST 47* 258* 320* 203*  ALT 18 66* 88* 73*  ALKPHOS 362* 461* 464* 475*  BILITOT 1.4* 0.5 0.4 0.3  PROT 5.8* 4.0* 3.9* 4.7*  ALBUMIN  3.1* 2.6* 2.6* 3.2*   No results for input(s): LIPASE, AMYLASE in the last 168 hours. No results for input(s):  AMMONIA in the last 168 hours. CBC: Recent Labs  Lab 04/13/24 1531 04/13/24 1754 04/14/24 2257 04/15/24 0850 04/17/24 0830  WBC 7.3  --  8.7 8.5 7.3  NEUTROABS 4.5  --  2.3 3.0 3.0  HGB 11.9* 13.3 12.5 12.4 9.6*  HCT 40.8 39.0 38.4 38.3 31.5*  MCV 91.7  --  82.6 83.3 87.7  PLT 178  --  182 174 165   Cardiac Enzymes: No results for input(s): CKTOTAL, CKMB, CKMBINDEX, TROPONINI in the last 168 hours. BNP: BNP (last 3 results) Recent Labs    01/04/24 0424 01/16/24 0608 03/21/24 2153  BNP >4,500.0* 4,044.0* >4,500.0*  ProBNP (last 3 results) No results for input(s): PROBNP in the last 8760 hours.  CBG: Recent Labs  Lab 04/17/24 2225 04/18/24 0003 04/18/24 0412 04/18/24 0818 04/18/24 1224  GLUCAP 320* 295* 202* 135* 375*       Signed:  Meliton Monte MD.  [facility] 04/18/2024, 7:45 PM       [1]  Allergies Allergen Reactions   Keflex  [Cephalexin ] Anaphylaxis    Ceftriaxone  in the past with no reaction   Penicillins Anaphylaxis, Hives and Rash   Vibramycin  [Doxycycline ] Anaphylaxis   Benadryl  [Diphenhydramine ] Itching   Dilaudid  [Hydromorphone ] Itching   Methotrexate And Trimetrexate Rash   Roxicodone  [Oxycodone ] Itching    Takes Percocet without issue

## 2024-04-18 NOTE — Progress Notes (Signed)
 [facility] night cross cover note:   After returning from HD, patient conveys that she does not feel well enough to discharge to home tonight and requests re-evaluation for discharge in the AM. No specific complaints, interventions, or requests at this time. I have subsequently d\c'ed the discharge order.    Eva Pore, DO Hospitalist

## 2024-04-18 NOTE — Progress Notes (Signed)
" °   04/18/24 1900  Vitals  BP (!) 172/85  MAP (mmHg) 107  BP Location Left Arm  BP Method Automatic  Patient Position (if appropriate) Lying  Pulse Rate 81  Pulse Rate Source Monitor  ECG Heart Rate 81  Resp 18  Oxygen Therapy  SpO2 100 %  O2 Device Room Air  During Treatment Monitoring  Blood Flow Rate (mL/min) 0 mL/min  Arterial Pressure (mmHg) 0 mmHg  Venous Pressure (mmHg) 0 mmHg  TMP (mmHg) 17.57 mmHg  Ultrafiltration Rate (mL/min) 985 mL/min  Dialysate Flow Rate (mL/min) 300 ml/min  Dialysate Potassium Concentration 3  Dialysate Calcium  Concentration 2.5  Duration of HD Treatment -hour(s) 2.81 hour(s)  Cumulative Fluid Removed (mL) per Treatment  2315.34  HD Safety Checks Performed Yes  Intra-Hemodialysis Comments Tx completed  Post Treatment  Dialyzer Clearance Lightly streaked  Liters Processed 61.1  Fluid Removed (mL) 2300 mL  Tolerated HD Treatment Yes  AVG/AVF Arterial Site Held (minutes) 12 minutes  AVG/AVF Venous Site Held (minutes) 7 minutes  Fistula / Graft Right Upper arm Arteriovenous vein graft  Placement Date/Time: 07/06/22 1013   Placed prior to admission: No  Orientation: Right  Access Location: Upper arm  Access Type: Arteriovenous vein graft  Site Condition No complications  Fistula / Graft Assessment Present;Thrill;Bruit  Status Deaccessed  Needle Size 15  Drainage Description None    "

## 2024-04-18 NOTE — Plan of Care (Incomplete)
" °  Problem: Education: Goal: Knowledge of General Education information will improve Description: Including pain rating scale, medication(s)/side effects and non-pharmacologic comfort measures Outcome: Adequate for Discharge   Problem: Health Behavior/Discharge Planning: Goal: Ability to manage health-related needs will improve Outcome: Adequate for Discharge   Problem: Clinical Measurements: Goal: Ability to maintain clinical measurements within normal limits will improve Outcome: Adequate for Discharge Goal: Will remain free from infection Outcome: Adequate for Discharge Goal: Diagnostic test results will improve Outcome: Adequate for Discharge Goal: Respiratory complications will improve Outcome: Adequate for Discharge Goal: Cardiovascular complication will be avoided Outcome: Adequate for Discharge   Problem: Nutrition: Goal: Adequate nutrition will be maintained Outcome: Adequate for Discharge   Problem: Coping: Goal: Level of anxiety will decrease Outcome: Adequate for Discharge   Problem: Elimination: Goal: Will not experience complications related to bowel motility Outcome: Adequate for Discharge Goal: Will not experience complications related to urinary retention Outcome: Adequate for Discharge   Problem: Pain Managment: Goal: General experience of comfort will improve and/or be controlled Outcome: Adequate for Discharge   Problem: Safety: Goal: Ability to remain free from injury will improve Outcome: Adequate for Discharge   Problem: Skin Integrity: Goal: Risk for impaired skin integrity will decrease Outcome: Adequate for Discharge PT D/C home after Dialysis, IV removed, tele removed, TOC meds given, belongings returned AVS printed and reviewed with all follow up questions answered.    "

## 2024-04-18 NOTE — Progress Notes (Signed)
 " [facility] Progress Note   Subjective:    Patient still here 2nd hyperglycemia episode yesterday. Unable to dialyze urgently given current HD census. Plan for HD tonight then she can be discharged tomorrow morning. Discussed this with Hospitalist and patient.  Objective Vitals:   04/18/24 0005 04/18/24 0414 04/18/24 0817 04/18/24 1227  BP: (!) 152/70 (!) 187/82 136/73 (!) 155/83  Pulse: 96 90 84 79  Resp:   15 14  Temp: 98.2 F (36.8 C) 98.4 F (36.9 C) 98.2 F (36.8 C) 98 F (36.7 C)  TempSrc: Oral Oral Oral Oral  SpO2: 96% 99% 97% 94%  Weight:      Height:       Physical Exam General: Chronically ill appearing, NAD. Room air Heart: RRR Lungs: CTA anteriorly Abdomen: mildly tender, soft Extremities: no LE edema Dialysis Access: R AVF +t/b  Filed Weights   04/15/24 1330 04/17/24 0812 04/17/24 1209  Weight: 63 kg 59.1 kg 54.1 kg   No intake or output data in the 24 hours ending 04/18/24 1406  Additional Objective Labs: Basic Metabolic Panel: Recent Labs  Lab 04/14/24 2257 04/15/24 0850 04/17/24 0830 04/18/24 0613  NA 126* 127* 138 143  K 3.8 3.6 3.9 4.1  CL 96* 97* 106 106  CO2 16* 19* 19* 24  GLUCOSE 75 55* 281* 87  BUN 26* 29* 33* 25*  CREATININE 6.19* 6.47* 6.23* 4.57*  CALCIUM  7.6* 7.5* 8.1* 8.3*  PHOS 5.0* 5.4* 5.5*  --    Liver Function Tests: Recent Labs  Lab 04/14/24 2257 04/15/24 0850 04/17/24 0830  AST 258* 320* 203*  ALT 66* 88* 73*  ALKPHOS 461* 464* 475*  BILITOT 0.5 0.4 0.3  PROT 4.0* 3.9* 4.7*  ALBUMIN  2.6* 2.6* 3.2*   No results for input(s): LIPASE, AMYLASE in the last 168 hours. CBC: Recent Labs  Lab 04/13/24 1531 04/13/24 1754 04/14/24 2257 04/15/24 0850 04/17/24 0830  WBC 7.3  --  8.7 8.5 7.3  NEUTROABS 4.5  --  2.3 3.0 3.0  HGB 11.9*   < > 12.5 12.4 9.6*  HCT 40.8   < > 38.4 38.3 31.5*  MCV 91.7  --  82.6 83.3 87.7  PLT 178  --  182 174 165   < > = values in this interval not displayed.    Blood Culture    Component Value Date/Time   SDES BLOOD LEFT HAND 04/13/2024 1746   SPECREQUEST  04/13/2024 1746    BOTTLES DRAWN AEROBIC AND ANAEROBIC Blood Culture results may not be optimal due to an inadequate volume of blood received in culture bottles   CULT  04/13/2024 1746    NO GROWTH 5 DAYS Performed at [facility], [address], [place], [place] [address]    REPTSTATUS 04/18/2024 FINAL 04/13/2024 1746    Cardiac Enzymes: No results for input(s): CKTOTAL, CKMB, CKMBINDEX, TROPONINI in the last 168 hours. CBG: Recent Labs  Lab 04/17/24 2225 04/18/24 0003 04/18/24 0412 04/18/24 0818 04/18/24 1224  GLUCAP 320* 295* 202* 135* 375*   Iron  Studies: No results for input(s): IRON , TIBC, TRANSFERRIN, FERRITIN in the last 72 hours. Lab Results  Component Value Date   INR 1.5 (H) 04/06/2024   INR 1.2 02/22/2024   INR 1.3 (H) 02/04/2024   Studies/Results: CT CHEST WO CONTRAST Result Date: 04/17/2024 EXAM: CT CHEST WITHOUT CONTRAST 04/16/2024 08:37:00 PM TECHNIQUE: CT of the chest was performed without the administration of intravenous contrast. Multiplanar reformatted images are provided for review.  Automated exposure control, iterative reconstruction, and/or weight based adjustment of the mA/kV was utilized to reduce the radiation dose to as low as reasonably achievable. COMPARISON: None available. CLINICAL HISTORY: Respiratory illness, nondiagnostic xray. FINDINGS: MEDIASTINUM: Mild coronary artery calcification. Mild cardiomegaly. No pericardial effusion. The central airways are clear. No central obstructing lesion. LYMPH NODES: No mediastinal, hilar or axillary lymphadenopathy. LUNGS AND PLEURA: Small bilateral pleural effusions. Diffuse ground-glass pulmonary infiltrate is noted with associated smooth and lobular septal thickening, best appreciated within the lung bases. These findings are consistent with small to moderate interstitial and alveolar  pulmonary edema. No pneumothorax. SOFT TISSUES/BONES: Diffuse subcutaneous body wall edema consistent with anasarca and/or cardiogenic failure. No acute abnormality of the bones. UPPER ABDOMEN: Limited images of the upper abdomen demonstrates mild ascites. IMPRESSION: 1. Mild cardiomegaly and mild coronary artery calcification. 2. Small to moderate pulmonary edema. Small bilateral pleural effusions. Mild ascites and diffuse subcutaneous body wall edema, consistent with anasarca and/or cardiogenic failure. Electronically signed by: Dorethia Molt MD 04/17/2024 01:16 AM EST RP Workstation: [id number]    Medications:  cefTRIAXone  (ROCEPHIN )  IV 1 g (04/17/24 2233)    carvedilol   25 mg Oral BID WC   [START ON 04/19/2024] Chlorhexidine  Gluconate Cloth  6 each Topical Q0600   DULoxetine   20 mg Oral Daily   heparin  injection (subcutaneous)  5,000 Units Subcutaneous Q8H   insulin  aspart  0-6 Units Subcutaneous Q4H   insulin  glargine  5 Units Subcutaneous Daily   lamoTRIgine   25 mg Oral Daily   Followed by   NOREEN ON 04/28/2024] lamoTRIgine   50 mg Oral Daily   Followed by   NOREEN ON 05/12/2024] lamoTRIgine   100 mg Oral Daily   Followed by   NOREEN ON 05/19/2024] lamoTRIgine   100 mg Oral Daily   [START ON 05/19/2024] lamoTRIgine   25 mg Oral QHS   Followed by   NOREEN ON 05/26/2024] lamoTRIgine   50 mg Oral QHS   Followed by   NOREEN ON 06/02/2024] lamoTRIgine   100 mg Oral QHS   levETIRAcetam   500 mg Oral Daily   And   levETIRAcetam   250 mg Oral Q T,Th,Sa-HD   OLANZapine   5 mg Oral QHS   pantoprazole   40 mg Oral BID   sacubitril -valsartan   1 tablet Oral BID    Dialysis Orders: [facility] ([address] [id number]) 3:45hr, 400/800, EDW 57.5kg, 2K/2.5Ca, R AVG Heparin  bolus 1600 units with HD Calcitriol  1mcg - last 12/13 Sensipar  60mg  with HD - last 12/13 TUMS 1500mg  with HD - last 12/13    Assessment/Plan: Witnessed seizure - likely 2nd uncontrolled DM; neurology consulted;  on Keppra . T1DM/DKA - Per primary Possible PNA - On Levaquin  for now, per primary ESRD - usual TTS schedule - Plan for HD tonight. Hypertension/volume  - another 5L off 12/19, should be at or below dry weight. Anemia of CKD - Hgb 12.4 -> 9.6. No signs of bleeding. Resume ESA as needed. Secondary Hyperparathyroidism - Ca/Phos ok - continue home meds. Bipolar - On Cymbalta  and Zyprexa  Nutrition - Renal diet with fluid restriction Dispo - Plan for HD tonight per her usual schedule then she can leave. Okay for discharge Sunday morning from a renal standpoint.   Charmaine Piety, NP [facility] 04/18/2024,2:06 PM  LOS: 5 days    "

## 2024-04-19 LAB — GLUCOSE, CAPILLARY
Glucose-Capillary: 164 mg/dL — ABNORMAL HIGH (ref 70–99)
Glucose-Capillary: 201 mg/dL — ABNORMAL HIGH (ref 70–99)
Glucose-Capillary: 259 mg/dL — ABNORMAL HIGH (ref 70–99)

## 2024-04-19 LAB — BASIC METABOLIC PANEL WITH GFR
Anion gap: 10 (ref 5–15)
BUN: 24 mg/dL — ABNORMAL HIGH (ref 6–20)
CO2: 26 mmol/L (ref 22–32)
Calcium: 8.2 mg/dL — ABNORMAL LOW (ref 8.9–10.3)
Chloride: 102 mmol/L (ref 98–111)
Creatinine, Ser: 3.49 mg/dL — ABNORMAL HIGH (ref 0.44–1.00)
GFR, Estimated: 17 mL/min — ABNORMAL LOW
Glucose, Bld: 210 mg/dL — ABNORMAL HIGH (ref 70–99)
Potassium: 4.9 mmol/L (ref 3.5–5.1)
Sodium: 137 mmol/L (ref 135–145)

## 2024-04-19 NOTE — Plan of Care (Signed)
" °  Problem: Education: Goal: Knowledge of General Education information will improve Description: Including pain rating scale, medication(s)/side effects and non-pharmacologic comfort measures 04/19/2024 1037 by Jenel Bobetta SAILOR, RN Outcome: Adequate for Discharge 04/19/2024 0955 by Jenel Bobetta SAILOR, RN Outcome: Progressing 04/19/2024 0736 by Jenel Bobetta SAILOR, RN Outcome: Progressing   Problem: Health Behavior/Discharge Planning: Goal: Ability to manage health-related needs will improve 04/19/2024 1037 by Jenel Bobetta SAILOR, RN Outcome: Adequate for Discharge 04/19/2024 0955 by Jenel Bobetta SAILOR, RN Outcome: Progressing 04/19/2024 0736 by Jenel Bobetta SAILOR, RN Outcome: Progressing   Problem: Clinical Measurements: Goal: Ability to maintain clinical measurements within normal limits will improve 04/19/2024 1037 by Jenel Bobetta SAILOR, RN Outcome: Adequate for Discharge 04/19/2024 0955 by Jenel Bobetta SAILOR, RN Outcome: Progressing 04/19/2024 0736 by Jenel Bobetta SAILOR, RN Outcome: Progressing Goal: Will remain free from infection 04/19/2024 1037 by Jenel Bobetta SAILOR, RN Outcome: Adequate for Discharge 04/19/2024 0955 by Jenel Bobetta SAILOR, RN Outcome: Progressing 04/19/2024 0736 by Jenel Bobetta SAILOR, RN Outcome: Progressing Goal: Diagnostic test results will improve 04/19/2024 1037 by Jenel Bobetta SAILOR, RN Outcome: Adequate for Discharge 04/19/2024 0955 by Jenel Bobetta SAILOR, RN Outcome: Progressing 04/19/2024 0736 by Jenel Bobetta SAILOR, RN Outcome: Progressing Goal: Respiratory complications will improve 04/19/2024 1037 by Jenel Bobetta SAILOR, RN Outcome: Adequate for Discharge 04/19/2024 0955 by Jenel Bobetta SAILOR, RN Outcome: Progressing 04/19/2024 0736 by Jenel Bobetta SAILOR, RN Outcome: Progressing Goal: Cardiovascular complication will be avoided 04/19/2024 1037 by Jenel Bobetta SAILOR, RN Outcome: Adequate for Discharge 04/19/2024 0955 by Jenel Bobetta SAILOR, RN Outcome: Progressing 04/19/2024 0736 by Jenel Bobetta SAILOR, RN Outcome: Progressing   Problem: Nutrition: Goal: Adequate nutrition will be maintained 04/19/2024 1037 by Jenel Bobetta SAILOR, RN Outcome: Adequate for Discharge 04/19/2024 0955 by Jenel Bobetta SAILOR, RN Outcome: Progressing 04/19/2024 0736 by Jenel Bobetta SAILOR, RN Outcome: Progressing   Problem: Coping: Goal: Level of anxiety will decrease 04/19/2024 1037 by Jenel Bobetta SAILOR, RN Outcome: Adequate for Discharge 04/19/2024 0955 by Jenel Bobetta SAILOR, RN Outcome: Progressing 04/19/2024 0736 by Jenel Bobetta SAILOR, RN Outcome: Progressing   Problem: Elimination: Goal: Will not experience complications related to bowel motility 04/19/2024 1037 by Jenel Bobetta SAILOR, RN Outcome: Adequate for Discharge 04/19/2024 0955 by Jenel Bobetta SAILOR, RN Outcome: Progressing 04/19/2024 0736 by Jenel Bobetta SAILOR, RN Outcome: Progressing Goal: Will not experience complications related to urinary retention 04/19/2024 1037 by Jenel Bobetta SAILOR, RN Outcome: Adequate for Discharge 04/19/2024 0955 by Jenel Bobetta SAILOR, RN Outcome: Progressing 04/19/2024 0736 by Jenel Bobetta SAILOR, RN Outcome: Progressing   Problem: Pain Managment: Goal: General experience of comfort will improve and/or be controlled 04/19/2024 1037 by Jenel Bobetta SAILOR, RN Outcome: Adequate for Discharge 04/19/2024 0955 by Jenel Bobetta SAILOR, RN Outcome: Progressing 04/19/2024 0736 by Jenel Bobetta SAILOR, RN Outcome: Progressing   Problem: Safety: Goal: Ability to remain free from injury will improve 04/19/2024 1037 by Jenel Bobetta SAILOR, RN Outcome: Adequate for Discharge 04/19/2024 0955 by Jenel Bobetta SAILOR, RN Outcome: Progressing 04/19/2024 0736 by Jenel Bobetta SAILOR, RN Outcome: Progressing   Problem: Skin Integrity: Goal: Risk for impaired skin integrity will decrease 04/19/2024 1037 by Jenel Bobetta SAILOR, RN Outcome: Adequate for Discharge 04/19/2024 0955 by Jenel Bobetta SAILOR, RN Outcome: Progressing 04/19/2024 0736 by Jenel Bobetta SAILOR,  RN Outcome: Progressing   "

## 2024-04-19 NOTE — Progress Notes (Signed)
 Discharge instructions given to patient, patient verbalizes an understanding of discharge instructions. X1 PIV removed site clean dry and intact. Expressed the importance of keeping up with follow up appointments and taking home medications as prescribed. Patient wheeled to discharge lounge to wait for transportation home at this time.

## 2024-04-19 NOTE — Plan of Care (Signed)
  Problem: Education: Goal: Knowledge of General Education information will improve Description: Including pain rating scale, medication(s)/side effects and non-pharmacologic comfort measures Outcome: Progressing   Problem: Health Behavior/Discharge Planning: Goal: Ability to manage health-related needs will improve Outcome: Progressing   Problem: Clinical Measurements: Goal: Ability to maintain clinical measurements within normal limits will improve Outcome: Progressing Goal: Will remain free from infection Outcome: Progressing Goal: Diagnostic test results will improve Outcome: Progressing Goal: Respiratory complications will improve Outcome: Progressing Goal: Cardiovascular complication will be avoided Outcome: Progressing   Problem: Nutrition: Goal: Adequate nutrition will be maintained Outcome: Progressing   Problem: Coping: Goal: Level of anxiety will decrease Outcome: Progressing   Problem: Elimination: Goal: Will not experience complications related to bowel motility Outcome: Progressing Goal: Will not experience complications related to urinary retention Outcome: Progressing   Problem: Pain Managment: Goal: General experience of comfort will improve and/or be controlled Outcome: Progressing   Problem: Safety: Goal: Ability to remain free from injury will improve Outcome: Progressing   Problem: Skin Integrity: Goal: Risk for impaired skin integrity will decrease Outcome: Progressing

## 2024-04-19 NOTE — Progress Notes (Signed)
 Patient did not go home last night as she did not have a ride to go home.  Discharge order placed.  Case management and social work contacted to assist with transportation.

## 2024-04-19 NOTE — Care Management (Signed)
 No needs identified for DC, please send patient to [facility] for taxi voucher

## 2024-04-19 NOTE — Plan of Care (Signed)
" °  Problem: Education: Goal: Knowledge of General Education information will improve Description: Including pain rating scale, medication(s)/side effects and non-pharmacologic comfort measures 04/19/2024 0955 by Jenel Bobetta SAILOR, RN Outcome: Progressing 04/19/2024 0736 by Jenel Bobetta SAILOR, RN Outcome: Progressing   Problem: Health Behavior/Discharge Planning: Goal: Ability to manage health-related needs will improve 04/19/2024 0955 by Jenel Bobetta SAILOR, RN Outcome: Progressing 04/19/2024 0736 by Jenel Bobetta SAILOR, RN Outcome: Progressing   Problem: Clinical Measurements: Goal: Ability to maintain clinical measurements within normal limits will improve 04/19/2024 0955 by Jenel Bobetta SAILOR, RN Outcome: Progressing 04/19/2024 0736 by Jenel Bobetta SAILOR, RN Outcome: Progressing Goal: Will remain free from infection 04/19/2024 0955 by Jenel Bobetta SAILOR, RN Outcome: Progressing 04/19/2024 0736 by Jenel Bobetta SAILOR, RN Outcome: Progressing Goal: Diagnostic test results will improve 04/19/2024 0955 by Jenel Bobetta SAILOR, RN Outcome: Progressing 04/19/2024 0736 by Jenel Bobetta SAILOR, RN Outcome: Progressing Goal: Respiratory complications will improve 04/19/2024 0955 by Jenel Bobetta SAILOR, RN Outcome: Progressing 04/19/2024 0736 by Jenel Bobetta SAILOR, RN Outcome: Progressing Goal: Cardiovascular complication will be avoided 04/19/2024 0955 by Jenel Bobetta SAILOR, RN Outcome: Progressing 04/19/2024 0736 by Jenel Bobetta SAILOR, RN Outcome: Progressing   Problem: Nutrition: Goal: Adequate nutrition will be maintained 04/19/2024 0955 by Jenel Bobetta SAILOR, RN Outcome: Progressing 04/19/2024 0736 by Jenel Bobetta SAILOR, RN Outcome: Progressing   Problem: Coping: Goal: Level of anxiety will decrease 04/19/2024 0955 by Jenel Bobetta SAILOR, RN Outcome: Progressing 04/19/2024 0736 by Jenel Bobetta SAILOR, RN Outcome: Progressing   Problem: Elimination: Goal: Will not experience complications related to bowel motility 04/19/2024  0955 by Jenel Bobetta SAILOR, RN Outcome: Progressing 04/19/2024 0736 by Jenel Bobetta SAILOR, RN Outcome: Progressing Goal: Will not experience complications related to urinary retention 04/19/2024 0955 by Jenel Bobetta SAILOR, RN Outcome: Progressing 04/19/2024 0736 by Jenel Bobetta SAILOR, RN Outcome: Progressing   Problem: Pain Managment: Goal: General experience of comfort will improve and/or be controlled 04/19/2024 0955 by Jenel Bobetta SAILOR, RN Outcome: Progressing 04/19/2024 0736 by Jenel Bobetta SAILOR, RN Outcome: Progressing   Problem: Safety: Goal: Ability to remain free from injury will improve 04/19/2024 0955 by Jenel Bobetta SAILOR, RN Outcome: Progressing 04/19/2024 0736 by Jenel Bobetta SAILOR, RN Outcome: Progressing   Problem: Skin Integrity: Goal: Risk for impaired skin integrity will decrease 04/19/2024 0955 by Jenel Bobetta SAILOR, RN Outcome: Progressing 04/19/2024 0736 by Jenel Bobetta SAILOR, RN Outcome: Progressing   "

## 2024-04-19 NOTE — Progress Notes (Incomplete)
 SABRA

## 2024-04-20 ENCOUNTER — Other Ambulatory Visit (HOSPITAL_COMMUNITY): Payer: Self-pay

## 2024-04-20 NOTE — Progress Notes (Signed)
 Late Note Entry- Apr 20, 2024  Pt was d/c yesterday. Contacted [facility] this morning to advise staff of pt's d/c date. Clinic staff to contact pt directly regarding pt's holiday schedule for this week. D/C summary and last renal note faxed to clinic for continuation of care.   Randine Mungo Dialysis Navigator [phone]

## 2024-04-23 ENCOUNTER — Encounter (HOSPITAL_COMMUNITY): Payer: Self-pay

## 2024-04-23 ENCOUNTER — Other Ambulatory Visit: Payer: Self-pay

## 2024-04-23 ENCOUNTER — Emergency Department (HOSPITAL_COMMUNITY)

## 2024-04-23 ENCOUNTER — Inpatient Hospital Stay (HOSPITAL_COMMUNITY)

## 2024-04-23 ENCOUNTER — Inpatient Hospital Stay (HOSPITAL_COMMUNITY)
Admission: EM | Admit: 2024-04-23 | Discharge: 2024-04-28 | DRG: 637 | Discharge status: Against Medical Advice | Attending: Student | Admitting: Student

## 2024-04-23 DIAGNOSIS — F319 Bipolar disorder, unspecified: Secondary | ICD-10-CM | POA: Diagnosis present

## 2024-04-23 DIAGNOSIS — T426X6A Underdosing of other antiepileptic and sedative-hypnotic drugs, initial encounter: Secondary | ICD-10-CM | POA: Diagnosis not present

## 2024-04-23 DIAGNOSIS — N186 End stage renal disease: Secondary | ICD-10-CM | POA: Diagnosis present

## 2024-04-23 DIAGNOSIS — R188 Other ascites: Secondary | ICD-10-CM | POA: Diagnosis present

## 2024-04-23 DIAGNOSIS — E871 Hypo-osmolality and hyponatremia: Secondary | ICD-10-CM | POA: Diagnosis present

## 2024-04-23 DIAGNOSIS — R569 Unspecified convulsions: Secondary | ICD-10-CM

## 2024-04-23 DIAGNOSIS — Z881 Allergy status to other antibiotic agents status: Secondary | ICD-10-CM

## 2024-04-23 DIAGNOSIS — Z794 Long term (current) use of insulin: Secondary | ICD-10-CM | POA: Diagnosis not present

## 2024-04-23 DIAGNOSIS — G9340 Encephalopathy, unspecified: Secondary | ICD-10-CM | POA: Diagnosis not present

## 2024-04-23 DIAGNOSIS — K219 Gastro-esophageal reflux disease without esophagitis: Secondary | ICD-10-CM | POA: Diagnosis present

## 2024-04-23 DIAGNOSIS — E875 Hyperkalemia: Secondary | ICD-10-CM | POA: Diagnosis present

## 2024-04-23 DIAGNOSIS — Z79899 Other long term (current) drug therapy: Secondary | ICD-10-CM | POA: Diagnosis not present

## 2024-04-23 DIAGNOSIS — E8770 Fluid overload, unspecified: Secondary | ICD-10-CM | POA: Diagnosis not present

## 2024-04-23 DIAGNOSIS — Z91148 Patient's other noncompliance with medication regimen for other reason: Secondary | ICD-10-CM

## 2024-04-23 DIAGNOSIS — E1022 Type 1 diabetes mellitus with diabetic chronic kidney disease: Secondary | ICD-10-CM | POA: Diagnosis present

## 2024-04-23 DIAGNOSIS — D631 Anemia in chronic kidney disease: Secondary | ICD-10-CM | POA: Diagnosis present

## 2024-04-23 DIAGNOSIS — N250 Renal osteodystrophy: Secondary | ICD-10-CM | POA: Diagnosis present

## 2024-04-23 DIAGNOSIS — J9601 Acute respiratory failure with hypoxia: Secondary | ICD-10-CM

## 2024-04-23 DIAGNOSIS — E1010 Type 1 diabetes mellitus with ketoacidosis without coma: Secondary | ICD-10-CM | POA: Diagnosis present

## 2024-04-23 DIAGNOSIS — Z885 Allergy status to narcotic agent status: Secondary | ICD-10-CM

## 2024-04-23 DIAGNOSIS — E1110 Type 2 diabetes mellitus with ketoacidosis without coma: Secondary | ICD-10-CM | POA: Diagnosis present

## 2024-04-23 DIAGNOSIS — Z992 Dependence on renal dialysis: Secondary | ICD-10-CM

## 2024-04-23 DIAGNOSIS — E10649 Type 1 diabetes mellitus with hypoglycemia without coma: Secondary | ICD-10-CM | POA: Diagnosis not present

## 2024-04-23 DIAGNOSIS — R9431 Abnormal electrocardiogram [ECG] [EKG]: Secondary | ICD-10-CM | POA: Diagnosis present

## 2024-04-23 DIAGNOSIS — R7401 Elevation of levels of liver transaminase levels: Secondary | ICD-10-CM | POA: Diagnosis not present

## 2024-04-23 DIAGNOSIS — I132 Hypertensive heart and chronic kidney disease with heart failure and with stage 5 chronic kidney disease, or end stage renal disease: Secondary | ICD-10-CM | POA: Diagnosis present

## 2024-04-23 DIAGNOSIS — F419 Anxiety disorder, unspecified: Secondary | ICD-10-CM | POA: Diagnosis present

## 2024-04-23 DIAGNOSIS — I428 Other cardiomyopathies: Secondary | ICD-10-CM | POA: Diagnosis present

## 2024-04-23 DIAGNOSIS — I10 Essential (primary) hypertension: Secondary | ICD-10-CM | POA: Diagnosis not present

## 2024-04-23 DIAGNOSIS — Z5320 Procedure and treatment not carried out because of patient's decision for unspecified reasons: Secondary | ICD-10-CM | POA: Diagnosis present

## 2024-04-23 DIAGNOSIS — I959 Hypotension, unspecified: Secondary | ICD-10-CM | POA: Diagnosis not present

## 2024-04-23 DIAGNOSIS — G9341 Metabolic encephalopathy: Secondary | ICD-10-CM | POA: Diagnosis present

## 2024-04-23 DIAGNOSIS — Z888 Allergy status to other drugs, medicaments and biological substances status: Secondary | ICD-10-CM

## 2024-04-23 DIAGNOSIS — I429 Cardiomyopathy, unspecified: Secondary | ICD-10-CM | POA: Diagnosis not present

## 2024-04-23 DIAGNOSIS — E785 Hyperlipidemia, unspecified: Secondary | ICD-10-CM | POA: Diagnosis present

## 2024-04-23 DIAGNOSIS — Z833 Family history of diabetes mellitus: Secondary | ICD-10-CM | POA: Diagnosis not present

## 2024-04-23 DIAGNOSIS — Z880 Allergy status to penicillin: Secondary | ICD-10-CM

## 2024-04-23 DIAGNOSIS — G40909 Epilepsy, unspecified, not intractable, without status epilepticus: Secondary | ICD-10-CM | POA: Diagnosis present

## 2024-04-23 DIAGNOSIS — E1065 Type 1 diabetes mellitus with hyperglycemia: Secondary | ICD-10-CM | POA: Diagnosis not present

## 2024-04-23 DIAGNOSIS — Z87892 Personal history of anaphylaxis: Secondary | ICD-10-CM

## 2024-04-23 DIAGNOSIS — Z825 Family history of asthma and other chronic lower respiratory diseases: Secondary | ICD-10-CM

## 2024-04-23 DIAGNOSIS — I5042 Chronic combined systolic (congestive) and diastolic (congestive) heart failure: Secondary | ICD-10-CM | POA: Diagnosis present

## 2024-04-23 DIAGNOSIS — N189 Chronic kidney disease, unspecified: Secondary | ICD-10-CM | POA: Diagnosis not present

## 2024-04-23 DIAGNOSIS — E861 Hypovolemia: Secondary | ICD-10-CM | POA: Diagnosis present

## 2024-04-23 DIAGNOSIS — J690 Pneumonitis due to inhalation of food and vomit: Secondary | ICD-10-CM | POA: Diagnosis not present

## 2024-04-23 DIAGNOSIS — I5022 Chronic systolic (congestive) heart failure: Secondary | ICD-10-CM | POA: Diagnosis not present

## 2024-04-23 DIAGNOSIS — E1051 Type 1 diabetes mellitus with diabetic peripheral angiopathy without gangrene: Secondary | ICD-10-CM | POA: Diagnosis not present

## 2024-04-23 LAB — COMPREHENSIVE METABOLIC PANEL WITH GFR
ALT: 133 U/L — ABNORMAL HIGH (ref 0–44)
AST: 59 U/L — ABNORMAL HIGH (ref 15–41)
Albumin: 3.6 g/dL (ref 3.5–5.0)
Alkaline Phosphatase: 722 U/L — ABNORMAL HIGH (ref 38–126)
BUN: 88 mg/dL — ABNORMAL HIGH (ref 6–20)
CO2: <7 mmol/L — ABNORMAL LOW (ref 22–32)
Calcium: 8.8 mg/dL — ABNORMAL LOW (ref 8.9–10.3)
Chloride: 79 mmol/L — ABNORMAL LOW (ref 98–111)
Creatinine, Ser: 6.79 mg/dL — ABNORMAL HIGH (ref 0.44–1.00)
GFR, Estimated: 8 mL/min — ABNORMAL LOW
Glucose, Bld: >1200 mg/dL (ref 70–99)
Potassium: >7.5 mmol/L (ref 3.5–5.1)
Sodium: 113 mmol/L — CL (ref 135–145)
Total Bilirubin: 0.6 mg/dL (ref 0.0–1.2)
Total Protein: 5.8 g/dL — ABNORMAL LOW (ref 6.5–8.1)

## 2024-04-23 LAB — GLUCOSE, CAPILLARY
Glucose-Capillary: 150 mg/dL — ABNORMAL HIGH (ref 70–99)
Glucose-Capillary: 211 mg/dL — ABNORMAL HIGH (ref 70–99)
Glucose-Capillary: 248 mg/dL — ABNORMAL HIGH (ref 70–99)
Glucose-Capillary: 277 mg/dL — ABNORMAL HIGH (ref 70–99)
Glucose-Capillary: 394 mg/dL — ABNORMAL HIGH (ref 70–99)
Glucose-Capillary: 537 mg/dL (ref 70–99)
Glucose-Capillary: 562 mg/dL (ref 70–99)
Glucose-Capillary: >600 mg/dL (ref 70–99)
Glucose-Capillary: >600 mg/dL (ref 70–99)
Glucose-Capillary: >600 mg/dL (ref 70–99)
Glucose-Capillary: >600 mg/dL (ref 70–99)
Glucose-Capillary: >600 mg/dL (ref 70–99)

## 2024-04-23 LAB — CBG MONITORING, ED
Glucose-Capillary: >600 mg/dL (ref 70–99)
Glucose-Capillary: >600 mg/dL (ref 70–99)
Glucose-Capillary: >600 mg/dL (ref 70–99)
Glucose-Capillary: >600 mg/dL (ref 70–99)
Glucose-Capillary: >600 mg/dL (ref 70–99)
Glucose-Capillary: >600 mg/dL (ref 70–99)
Glucose-Capillary: >600 mg/dL (ref 70–99)

## 2024-04-23 LAB — I-STAT CHEM 8, ED
BUN: 106 mg/dL — ABNORMAL HIGH (ref 6–20)
BUN: 89 mg/dL — ABNORMAL HIGH (ref 6–20)
Calcium, Ion: 0.98 mmol/L — ABNORMAL LOW (ref 1.15–1.40)
Calcium, Ion: 1.01 mmol/L — ABNORMAL LOW (ref 1.15–1.40)
Chloride: 92 mmol/L — ABNORMAL LOW (ref 98–111)
Chloride: 93 mmol/L — ABNORMAL LOW (ref 98–111)
Creatinine, Ser: 7 mg/dL — ABNORMAL HIGH (ref 0.44–1.00)
Creatinine, Ser: 7 mg/dL — ABNORMAL HIGH (ref 0.44–1.00)
Glucose, Bld: >700 mg/dL (ref 70–99)
Glucose, Bld: >700 mg/dL (ref 70–99)
HCT: 38 % (ref 36.0–46.0)
HCT: 39 % (ref 36.0–46.0)
Hemoglobin: 12.9 g/dL (ref 12.0–15.0)
Hemoglobin: 13.3 g/dL (ref 12.0–15.0)
Potassium: 7.1 mmol/L (ref 3.5–5.1)
Potassium: 7.6 mmol/L (ref 3.5–5.1)
Sodium: 113 mmol/L — CL (ref 135–145)
Sodium: 114 mmol/L — CL (ref 135–145)
TCO2: 10 mmol/L — ABNORMAL LOW (ref 22–32)
TCO2: 8 mmol/L — ABNORMAL LOW (ref 22–32)

## 2024-04-23 LAB — RESP PANEL BY RT-PCR (RSV, FLU A&B, COVID)  RVPGX2
Influenza A by PCR: NEGATIVE
Influenza B by PCR: NEGATIVE
Resp Syncytial Virus by PCR: NEGATIVE
SARS Coronavirus 2 by RT PCR: NEGATIVE

## 2024-04-23 LAB — I-STAT VENOUS BLOOD GAS, ED
Acid-base deficit: 20 mmol/L — ABNORMAL HIGH (ref 0.0–2.0)
Bicarbonate: 6.2 mmol/L — ABNORMAL LOW (ref 20.0–28.0)
Calcium, Ion: 1 mmol/L — ABNORMAL LOW (ref 1.15–1.40)
HCT: 37 % (ref 36.0–46.0)
Hemoglobin: 12.6 g/dL (ref 12.0–15.0)
O2 Saturation: 97 %
Potassium: 7.1 mmol/L (ref 3.5–5.1)
Sodium: 113 mmol/L — CL (ref 135–145)
TCO2: 7 mmol/L — ABNORMAL LOW (ref 22–32)
pCO2, Ven: 16.5 mmHg — CL (ref 44–60)
pH, Ven: 7.185 — CL (ref 7.25–7.43)
pO2, Ven: 110 mmHg — ABNORMAL HIGH (ref 32–45)

## 2024-04-23 LAB — POCT I-STAT 7, (LYTES, BLD GAS, ICA,H+H)
Acid-base deficit: 9 mmol/L — ABNORMAL HIGH (ref 0.0–2.0)
Bicarbonate: 18.8 mmol/L — ABNORMAL LOW (ref 20.0–28.0)
Calcium, Ion: 1.2 mmol/L (ref 1.15–1.40)
HCT: 34 % — ABNORMAL LOW (ref 36.0–46.0)
Hemoglobin: 11.6 g/dL — ABNORMAL LOW (ref 12.0–15.0)
O2 Saturation: 100 %
Patient temperature: 98.1
Potassium: 5.1 mmol/L (ref 3.5–5.1)
Sodium: 122 mmol/L — ABNORMAL LOW (ref 135–145)
TCO2: 20 mmol/L — ABNORMAL LOW (ref 22–32)
pCO2 arterial: 45.4 mmHg (ref 32–48)
pH, Arterial: 7.223 — ABNORMAL LOW (ref 7.35–7.45)
pO2, Arterial: 298 mmHg — ABNORMAL HIGH (ref 83–108)

## 2024-04-23 LAB — BASIC METABOLIC PANEL WITH GFR
Anion gap: 20 — ABNORMAL HIGH (ref 5–15)
Anion gap: 14 (ref 5–15)
BUN: 70 mg/dL — ABNORMAL HIGH (ref 6–20)
BUN: 37 mg/dL — ABNORMAL HIGH (ref 6–20)
CO2: 13 mmol/L — ABNORMAL LOW (ref 22–32)
CO2: 24 mmol/L (ref 22–32)
Calcium: 8.9 mg/dL (ref 8.9–10.3)
Calcium: 8.4 mg/dL — ABNORMAL LOW (ref 8.9–10.3)
Chloride: 87 mmol/L — ABNORMAL LOW (ref 98–111)
Chloride: 89 mmol/L — ABNORMAL LOW (ref 98–111)
Creatinine, Ser: 5.56 mg/dL — ABNORMAL HIGH (ref 0.44–1.00)
Creatinine, Ser: 3.23 mg/dL — ABNORMAL HIGH (ref 0.44–1.00)
GFR, Estimated: 10 mL/min — ABNORMAL LOW
GFR, Estimated: 19 mL/min — ABNORMAL LOW
Glucose, Bld: 904 mg/dL (ref 70–99)
Glucose, Bld: 287 mg/dL — ABNORMAL HIGH (ref 70–99)
Potassium: 6.6 mmol/L (ref 3.5–5.1)
Potassium: 3.5 mmol/L (ref 3.5–5.1)
Sodium: 120 mmol/L — ABNORMAL LOW (ref 135–145)
Sodium: 127 mmol/L — ABNORMAL LOW (ref 135–145)

## 2024-04-23 LAB — CBC WITH DIFFERENTIAL/PLATELET
Abs Immature Granulocytes: 0.09 K/uL — ABNORMAL HIGH (ref 0.00–0.07)
Basophils Absolute: 0 K/uL (ref 0.0–0.1)
Basophils Relative: 0 %
Eosinophils Absolute: 0.1 K/uL (ref 0.0–0.5)
Eosinophils Relative: 1 %
HCT: 39.5 % (ref 36.0–46.0)
Hemoglobin: 10.5 g/dL — ABNORMAL LOW (ref 12.0–15.0)
Immature Granulocytes: 1 %
Lymphocytes Relative: 17 %
Lymphs Abs: 1.7 K/uL (ref 0.7–4.0)
MCH: 26.6 pg (ref 26.0–34.0)
MCHC: 26.6 g/dL — ABNORMAL LOW (ref 30.0–36.0)
MCV: 100 fL (ref 80.0–100.0)
Monocytes Absolute: 0.9 K/uL (ref 0.1–1.0)
Monocytes Relative: 9 %
Neutro Abs: 7.5 K/uL (ref 1.7–7.7)
Neutrophils Relative %: 72 %
Platelets: 222 K/uL (ref 150–400)
RBC: 3.95 MIL/uL (ref 3.87–5.11)
RDW: 15.6 % — ABNORMAL HIGH (ref 11.5–15.5)
WBC: 10.4 K/uL (ref 4.0–10.5)
nRBC: 0 % (ref 0.0–0.2)

## 2024-04-23 LAB — HEPATITIS B SURFACE ANTIGEN: Hepatitis B Surface Ag: NONREACTIVE

## 2024-04-23 LAB — BETA-HYDROXYBUTYRIC ACID
Beta-Hydroxybutyric Acid: 0.54 mmol/L — ABNORMAL HIGH (ref 0.05–0.27)
Beta-Hydroxybutyric Acid: 0.06 mmol/L (ref 0.05–0.27)

## 2024-04-23 LAB — MRSA NEXT GEN BY PCR, NASAL: MRSA by PCR Next Gen: NOT DETECTED

## 2024-04-23 LAB — HCG, SERUM, QUALITATIVE
Preg, Serum: NEGATIVE
Preg, Serum: NEGATIVE

## 2024-04-23 MED ORDER — DEXTROSE 50 % IV SOLN
0.0000 mL | INTRAVENOUS | Status: DC | PRN
  Administered 2024-04-24: 25 mL via INTRAVENOUS
  Filled 2024-04-23: qty 50

## 2024-04-23 MED ORDER — LACTATED RINGERS IV BOLUS: 1000.0000 mL | Freq: Once | INTRAVENOUS | Status: DC

## 2024-04-23 MED ORDER — PANTOPRAZOLE SODIUM 40 MG IV SOLR
40.0000 mg | Freq: Two times a day (BID) | INTRAVENOUS | Status: DC
  Administered 2024-04-23 – 2024-04-26 (×7): 40 mg via INTRAVENOUS
  Filled 2024-04-23 (×7): qty 10

## 2024-04-23 MED ORDER — LACTATED RINGERS IV BOLUS
500.0000 mL | Freq: Once | INTRAVENOUS | Status: AC
  Administered 2024-04-23: 500 mL via INTRAVENOUS

## 2024-04-23 MED ORDER — INSULIN ASPART 100 UNIT/ML IJ SOLN
10.0000 [IU] | Freq: Once | INTRAMUSCULAR | Status: AC
  Administered 2024-04-23: 10 [IU] via SUBCUTANEOUS
  Filled 2024-04-23: qty 10

## 2024-04-23 MED ORDER — LORAZEPAM 2 MG/ML IJ SOLN: 1.0000 mg | INTRAMUSCULAR | Status: DC | PRN

## 2024-04-23 MED ORDER — POLYETHYLENE GLYCOL 3350 17 G PO PACK
17.0000 g | PACK | Freq: Every day | ORAL | Status: DC
  Administered 2024-04-25: 17 g
  Filled 2024-04-23: qty 1

## 2024-04-23 MED ORDER — ETOMIDATE 2 MG/ML IV SOLN
INTRAVENOUS | Status: AC | PRN
  Administered 2024-04-23: 20 mg via INTRAVENOUS

## 2024-04-23 MED ORDER — ROCURONIUM BROMIDE 10 MG/ML (PF) SYRINGE
PREFILLED_SYRINGE | INTRAVENOUS | Status: AC | PRN
  Administered 2024-04-23: 60 mg via INTRAVENOUS

## 2024-04-23 MED ORDER — DOCUSATE SODIUM 50 MG/5ML PO LIQD
100.0000 mg | Freq: Two times a day (BID) | ORAL | Status: DC
  Administered 2024-04-24 – 2024-04-25 (×3): 100 mg
  Filled 2024-04-23 (×3): qty 10

## 2024-04-23 MED ORDER — ROCURONIUM BROMIDE 10 MG/ML (PF) SYRINGE
PREFILLED_SYRINGE | INTRAVENOUS | Status: AC
  Filled 2024-04-23: qty 10

## 2024-04-23 MED ORDER — LEVETIRACETAM 500 MG PO TABS: 500.0000 mg | ORAL_TABLET | Freq: Every day | ORAL | Status: DC

## 2024-04-23 MED ORDER — ORAL CARE MOUTH RINSE
15.0000 mL | OROMUCOSAL | Status: DC
  Administered 2024-04-23 – 2024-04-26 (×33): 15 mL via OROMUCOSAL

## 2024-04-23 MED ORDER — DOCUSATE SODIUM 50 MG/5ML PO LIQD: 100.0000 mg | Freq: Two times a day (BID) | ORAL | Status: DC

## 2024-04-23 MED ORDER — LAMOTRIGINE 25 MG PO TABS: 25.0000 mg | ORAL_TABLET | ORAL | Status: DC

## 2024-04-23 MED ORDER — FENTANYL CITRATE (PF) 50 MCG/ML IJ SOSY
PREFILLED_SYRINGE | INTRAMUSCULAR | Status: AC
  Filled 2024-04-23: qty 2

## 2024-04-23 MED ORDER — SODIUM CHLORIDE 0.9 % IV SOLN
2.0000 g | INTRAVENOUS | Status: DC
  Administered 2024-04-23 – 2024-04-28 (×6): 2 g via INTRAVENOUS
  Filled 2024-04-23 (×4): qty 20

## 2024-04-23 MED ORDER — LORAZEPAM 2 MG/ML IJ SOLN
INTRAMUSCULAR | Status: AC
  Administered 2024-04-23: 2 mg via INTRAVENOUS
  Filled 2024-04-23: qty 1

## 2024-04-23 MED ORDER — SODIUM CHLORIDE 0.9% FLUSH
3.0000 mL | Freq: Two times a day (BID) | INTRAVENOUS | Status: DC
  Administered 2024-04-23 – 2024-04-27 (×7): 3 mL via INTRAVENOUS

## 2024-04-23 MED ORDER — ORAL CARE MOUTH RINSE: 15.0000 mL | OROMUCOSAL | Status: DC | PRN

## 2024-04-23 MED ORDER — HEPARIN SODIUM (PORCINE) 5000 UNIT/ML IJ SOLN
5000.0000 [IU] | Freq: Three times a day (TID) | INTRAMUSCULAR | Status: DC
  Administered 2024-04-23 – 2024-04-28 (×15): 5000 [IU] via SUBCUTANEOUS
  Filled 2024-04-23 (×10): qty 1

## 2024-04-23 MED ORDER — CALCIUM GLUCONATE-NACL 1-0.675 GM/50ML-% IV SOLN
1.0000 g | Freq: Once | INTRAVENOUS | Status: AC
  Administered 2024-04-23: 1000 mg via INTRAVENOUS
  Filled 2024-04-23: qty 50

## 2024-04-23 MED ORDER — SODIUM ZIRCONIUM CYCLOSILICATE 10 G PO PACK
10.0000 g | PACK | Freq: Once | ORAL | Status: AC
  Administered 2024-04-23: 10 g via ORAL
  Filled 2024-04-23: qty 1

## 2024-04-23 MED ORDER — ACETAMINOPHEN 325 MG PO TABS
650.0000 mg | ORAL_TABLET | Freq: Four times a day (QID) | ORAL | Status: DC | PRN
  Administered 2024-04-27: 650 mg via ORAL

## 2024-04-23 MED ORDER — TRIMETHOBENZAMIDE HCL 100 MG/ML IM SOLN
200.0000 mg | Freq: Four times a day (QID) | INTRAMUSCULAR | Status: DC | PRN
  Filled 2024-04-23: qty 2

## 2024-04-23 MED ORDER — DEXTROSE IN LACTATED RINGERS 5 % IV SOLN: INTRAVENOUS | Status: AC

## 2024-04-23 MED ORDER — LEVETIRACETAM (KEPPRA) 500 MG/5 ML ADULT IV PUSH: 500.0000 mg | Freq: Once | INTRAVENOUS | Status: AC

## 2024-04-23 MED ORDER — SODIUM CHLORIDE 0.9 % IV SOLN
100.0000 mg | Freq: Two times a day (BID) | INTRAVENOUS | Status: AC
  Administered 2024-04-23 – 2024-04-26 (×5): 100 mg via INTRAVENOUS
  Filled 2024-04-23 (×7): qty 10

## 2024-04-23 MED ORDER — LEVETIRACETAM 500 MG/5ML IV SOLN
INTRAVENOUS | Status: AC
  Administered 2024-04-23: 500 mg via INTRAVENOUS
  Filled 2024-04-23: qty 5

## 2024-04-23 MED ORDER — ALBUTEROL SULFATE (2.5 MG/3ML) 0.083% IN NEBU
2.5000 mg | INHALATION_SOLUTION | Freq: Four times a day (QID) | RESPIRATORY_TRACT | Status: DC | PRN
  Administered 2024-04-23 – 2024-04-27 (×3): 2.5 mg via RESPIRATORY_TRACT
  Filled 2024-04-23 (×2): qty 3

## 2024-04-23 MED ORDER — INSULIN REGULAR(HUMAN) IN NACL 100-0.9 UT/100ML-% IV SOLN
INTRAVENOUS | Status: DC
  Administered 2024-04-23: 4.8 [IU]/h via INTRAVENOUS
  Filled 2024-04-23: qty 100

## 2024-04-23 MED ORDER — MIDAZOLAM HCL 2 MG/2ML IJ SOLN
INTRAMUSCULAR | Status: AC
  Filled 2024-04-23: qty 2

## 2024-04-23 MED ORDER — ETOMIDATE 2 MG/ML IV SOLN
INTRAVENOUS | Status: AC
  Filled 2024-04-23: qty 20

## 2024-04-23 MED ORDER — MIDAZOLAM HCL 5 MG/5ML IJ SOLN
INTRAMUSCULAR | Status: AC | PRN
  Administered 2024-04-23: 2 mg via INTRAVENOUS

## 2024-04-23 MED ORDER — PROPOFOL 1000 MG/100ML IV EMUL
0.0000 ug/kg/min | INTRAVENOUS | Status: DC
  Administered 2024-04-23: 50 ug/kg/min via INTRAVENOUS
  Administered 2024-04-23 – 2024-04-24 (×2): 70 ug/kg/min via INTRAVENOUS
  Administered 2024-04-24: 20 ug/kg/min via INTRAVENOUS
  Filled 2024-04-23 (×3): qty 100

## 2024-04-23 MED ORDER — PANTOPRAZOLE SODIUM 40 MG PO TBEC: 40.0000 mg | DELAYED_RELEASE_TABLET | Freq: Two times a day (BID) | ORAL | Status: DC

## 2024-04-23 MED ORDER — FENTANYL CITRATE (PF) 100 MCG/2ML IJ SOLN
INTRAMUSCULAR | Status: AC | PRN
  Administered 2024-04-23 (×2): 50 ug via INTRAVENOUS

## 2024-04-23 MED ORDER — FENTANYL CITRATE (PF) 50 MCG/ML IJ SOSY
50.0000 ug | PREFILLED_SYRINGE | INTRAMUSCULAR | Status: DC | PRN
  Filled 2024-04-23: qty 1

## 2024-04-23 MED ORDER — ACETAMINOPHEN 650 MG RE SUPP
650.0000 mg | Freq: Four times a day (QID) | RECTAL | Status: DC | PRN
  Filled 2024-04-23: qty 1

## 2024-04-23 MED ORDER — LACTATED RINGERS IV SOLN: INTRAVENOUS | Status: AC

## 2024-04-23 MED ORDER — CHLORHEXIDINE GLUCONATE CLOTH 2 % EX PADS
6.0000 | MEDICATED_PAD | Freq: Every day | CUTANEOUS | Status: DC
  Administered 2024-04-23 – 2024-04-24 (×2): 6 via TOPICAL

## 2024-04-23 MED ORDER — LEVETIRACETAM 250 MG PO TABS: 250.0000 mg | ORAL_TABLET | ORAL | Status: DC

## 2024-04-23 MED ORDER — FENTANYL CITRATE (PF) 50 MCG/ML IJ SOSY
50.0000 ug | PREFILLED_SYRINGE | INTRAMUSCULAR | Status: DC | PRN
  Administered 2024-04-23 (×2): 150 ug via INTRAVENOUS
  Administered 2024-04-23: 200 ug via INTRAVENOUS
  Administered 2024-04-23: 100 ug via INTRAVENOUS
  Administered 2024-04-23 – 2024-04-24 (×3): 200 ug via INTRAVENOUS
  Filled 2024-04-23: qty 2
  Filled 2024-04-23: qty 3
  Filled 2024-04-23 (×2): qty 4
  Filled 2024-04-23: qty 3
  Filled 2024-04-23 (×2): qty 4
  Filled 2024-04-23: qty 2

## 2024-04-23 NOTE — Progress Notes (Signed)
 LTM VIDEO EEG hooked up and running - no initial skin breakdown - push button tested - [facility] is monitoring. NF assisted MB with hook up.

## 2024-04-23 NOTE — Consult Note (Signed)
 "  NAME:  Ashley Freeman, MRN:  [id number], DOB:  18-Jun-1990, LOS: 0 ADMISSION DATE:  04/23/2024, CONSULTATION DATE:  04/23/24 REFERRING MD:  Maximino Sharps, MD CHIEF COMPLAINT:  Seizures, vomiting   History of Present Illness:  33 year old female with below PMHx with recent hospitalization for seizure from 12/15-12/20 and prior to this DKA on 12/10. She presented on 12/25 for general pain, weakness and malaise. Patient reported as poor historian. Labs significant for Na 113, K >7.5 CO2 <7 glucose 1200, elevated LFTs. She was started on insulin  gtt and IVF for DKA. VBG with pH 7.185. Nephrology consulted for HD. PCCM consulted for worsening mental status with facial twitching, initially protecting airway. On exam patient had emesis and not protecting airway so intubated in the ED.  Pertinent  Medical History  Seizure, ESRD on HD, DM1 - poorly controlled, bipolar disorder  Significant Hospital Events: Including procedures, antibiotic start and stop dates in addition to other pertinent events     Interim History / Subjective:  As above  Objective    Blood pressure (!) 180/87, pulse 88, temperature (!) 97.5 F (36.4 C), temperature source Axillary, resp. rate 20, SpO2 99%.    Vent Mode: PRVC FiO2 (%):  [100 %] 100 % Set Rate:  [16 bmp] 16 bmp Vt Set:  [420 mL] 420 mL PEEP:  [5 cmH20] 5 cmH20   Intake/Output Summary (Last 24 hours) at 04/23/2024 1535 Last data filed at 04/23/2024 1114 Gross per 24 hour  Intake 516.99 ml  Output --  Net 516.99 ml   There were no vitals filed for this visit.  Physical Exam: General: Critically ill-appearing, actively vomiting, seizing HENT: Ashley Freeman, AT, OP clear, dry mucous membranes Eyes: EOMI, no scleral icterus Respiratory: Rhonchi bilaterally.  Cardiovascular: RRR, -M/R/G, no JVD GI: BS+, soft, nontender Extremities:Pedal edema,-tenderness Neuro: Encephalopathic, moving extremities x 4, right facial twitching, not following  commands  CXR 04/23/24 ETT in place, interval development of right pleural effusion   Resolved problem list   Assessment and Plan   Seizures: concern for nonadherence in setting of DKA -S/p keppra  load in ED -Appreciate Neurology input. Last admission plan to uptitrate lamictal  and wean Keppra  however unclear if patient was compliant with no recent refills of lamictal  -LTM EEG -AEDs per consult team. DC keppra  and lamictal . Starting Vimpat   Acute hypoxemic respiratory failure 2/2 aspiration, airway protection, volume overload -Intubated -Full vent support -LTVV, 4-8cc/kg IBW with goal Pplat<30 and DP<15 -PAD protocol with propofol  for RASS goal -4. PRN fentanyl  added -VAP -Post-intubation ABG, increase RR -CXR tomorrow after dialysis  Aspiration pneumonia/pneumonitis -Will start ceftriaxone . Hx of anaphylaxis for pencillin/keflex  but has tolerated ceftriaxone  in past -Obtain trach aspirate   DM type 1 DKA -Insulin  and IVF per DKA protocol -Serial BMET and BHA  ESRD with hyperkalemia, volume overload, right pleural effusion -S/p lokelma  -Initiating HD on arrival to ICU -Nephrology following  HTN -Hold antihypertensive agents  Bipolar/Depression/Anxiety -Hold home meds  Reflux -Continue home med PPI BID  DVT ppx - heparin  subq  Labs   CBC: Recent Labs  Lab 04/17/24 0830 04/23/24 0910 04/23/24 0953 04/23/24 1015 04/23/24 1016  WBC 7.3 10.4  --   --   --   NEUTROABS 3.0 7.5  --   --   --   HGB 9.6* 10.5* 13.3 12.9 12.6  HCT 31.5* 39.5 39.0 38.0 37.0  MCV 87.7 100.0  --   --   --   PLT 165 222  --   --   --  Basic Metabolic Panel: Recent Labs  Lab 04/17/24 0830 04/18/24 0613 04/19/24 0503 04/23/24 0910 04/23/24 0953 04/23/24 1015 04/23/24 1016  NA 138 143 137 113* 113* 114* 113*  K 3.9 4.1 4.9 >7.5* 7.6* 7.1* 7.1*  CL 106 106 102 79* 92* 93*  --   CO2 19* 24 26 <7*  --   --   --   GLUCOSE 281* 87 210* >1,200* >700* >700*  --   BUN 33*  25* 24* 88* 106* 89*  --   CREATININE 6.23* 4.57* 3.49* 6.79* 7.00* 7.00*  --   CALCIUM  8.1* 8.3* 8.2* 8.8*  --   --   --   MG 2.4  --   --   --   --   --   --   PHOS 5.5*  --   --   --   --   --   --    GFR: Estimated Creatinine Clearance: 9.6 mL/min (A) (by C-G formula based on SCr of 7 mg/dL (H)). Recent Labs  Lab 04/17/24 0830 04/23/24 0910  WBC 7.3 10.4    Liver Function Tests: Recent Labs  Lab 04/17/24 0830 04/23/24 0910  AST 203* 59*  ALT 73* 133*  ALKPHOS 475* 722*  BILITOT 0.3 0.6  PROT 4.7* 5.8*  ALBUMIN  3.2* 3.6   No results for input(s): LIPASE, AMYLASE in the last 168 hours. No results for input(s): AMMONIA in the last 168 hours.  ABG    Component Value Date/Time   PHART 7.33 (L) 10/26/2023 1415   PCO2ART 38 10/26/2023 1415   PO2ART 67 (L) 10/26/2023 1415   HCO3 6.2 (L) 04/23/2024 1016   TCO2 7 (L) 04/23/2024 1016   ACIDBASEDEF 20.0 (H) 04/23/2024 1016   O2SAT 97 04/23/2024 1016     Coagulation Profile: No results for input(s): INR, PROTIME in the last 168 hours.  Cardiac Enzymes: No results for input(s): CKTOTAL, CKMB, CKMBINDEX, TROPONINI in the last 168 hours.  HbA1C: Hemoglobin A1C  Date/Time Value Ref Range Status  11/11/2013 04:31 AM 14.6 (H) 4.2 - 6.3 % Final    Comment:    The American Diabetes Association recommends that a primary goal of therapy should be <7% and that physicians should reevaluate the treatment regimen in patients with HbA1c values consistently >8%.   09/12/2011 02:53 AM SEE COMMENT 4.2 - 6.3 % Final    Comment:    The American Diabetes Association recommends that a primary goal of therapy should be <7% and that physicians should reevaluate the treatment regimen in patients with HbA1c values consistently >8%. HGB A1C - Unable to perform testing at [facility] due  - to interfering substance. HGB A1C - H -- 16.8 %  - Reference Range: 4.8-5.6  - ----------------------------------------  - Increased risk  for diabetes: 5.7-6.4  - Diabetes: >6.4  - Glycemic control for adults with  - .SABRA... diabetes: <7.0  - ----------------------------------------  - PERFORMED BY [employer] [place] [employer] SPEC.NO.: [phone]  - [address] [phone]  - ELSIE JULIANNA DROSS, MD  - [phone]    HbA1c, POC (controlled diabetic range)  Date/Time Value Ref Range Status  06/04/2018 09:29 AM   Final    Comment:    >15   Hgb A1c MFr Bld  Date/Time Value Ref Range Status  03/30/2024 03:34 AM 8.5 (H) 4.8 - 5.6 % Final    Comment:    (NOTE)         Prediabetes: 5.7 - 6.4  Diabetes: >6.4         Glycemic control for adults with diabetes: <7.0   12/20/2023 03:05 AM 10.8 (H) 4.8 - 5.6 % Final    Comment:    (NOTE) Diagnosis of Diabetes The following HbA1c ranges recommended by the American Diabetes Association (ADA) may be used as an aid in the diagnosis of diabetes mellitus.  Hemoglobin             Suggested A1C NGSP%              Diagnosis  <5.7                   Non Diabetic  5.7-6.4                Pre-Diabetic  >6.4                   Diabetic  <7.0                   Glycemic control for                       adults with diabetes.      CBG: Recent Labs  Lab 04/23/24 1155 04/23/24 1226 04/23/24 1257 04/23/24 1347 04/23/24 1430  GLUCAP >600* >600* >600* >600* >600*    Review of Systems:   Unable to obtain due to critical illness  Past Medical History:  She,  has a past medical history of Abscess, gluteal, right (08/24/2013), Anemia (02/19/2012), Bartholin's gland abscess (09/19/2013), Bipolar disorder (HCC), BV (bacterial vaginosis) (11/24/2015), Depression, Diabetes mellitus type I (HCC) (2001), Diarrhea (05/30/2016), DKA (diabetic ketoacidoses) (08/19/2013), ESRD (end stage renal disease) (HCC), Gonorrhea (08/2011), HFrEF (heart failure with reduced ejection fraction) (HCC), History of trichomoniasis (05/31/2016), Hyperlipidemia (03/28/2016), Hypertension,  NICM (nonischemic cardiomyopathy) (HCC), and Sepsis (HCC) (09/19/2013).   Surgical History:   Past Surgical History:  Procedure Laterality Date   A/V FISTULAGRAM Right 06/17/2023   Procedure: A/V Fistulagram;  Surgeon: Marea Selinda RAMAN, MD;  Location: [facility] INVASIVE CV LAB;  Service: Cardiovascular;  Laterality: Right;   A/V SHUNT INTERVENTION N/A 09/25/2023   Procedure: A/V SHUNT INTERVENTION;  Surgeon: Pearline Norman RAMAN, MD;  Location: [facility] PV LAB;  Service: Cardiovascular;  Laterality: N/A;   AV FISTULA PLACEMENT Right 07/06/2022   Procedure: ARTERIOVENOUS GRAFT CREATION;  Surgeon: Gretta Lonni PARAS, MD;  Location: [facility] OR;  Service: Vascular;  Laterality: Right;   CESAREAN SECTION N/A 10/05/2019   Procedure: CESAREAN SECTION;  Surgeon: Izell Harari, MD;  Location: [facility] LD ORS;  Service: Obstetrics;  Laterality: N/A;   CHOLECYSTECTOMY N/A 07/02/2023   Procedure: LAPAROSCOPIC CHOLECYSTECTOMY;  Surgeon: Ebbie Cough, MD;  Location: [facility];  Service: General;  Laterality: N/A;   ESOPHAGOGASTRODUODENOSCOPY N/A 01/31/2024   Procedure: EGD (ESOPHAGOGASTRODUODENOSCOPY);  Surgeon: San Sandor GAILS, DO;  Location: [facility] ENDOSCOPY;  Service: Gastroenterology;  Laterality: N/A;   INCISION AND DRAINAGE ABSCESS Left 09/28/2019   Procedure: INCISION AND DRAINAGE VULVAR ABCESS;  Surgeon: Edsel Norleen GAILS, MD;  Location: [facility] OR;  Service: Gynecology;  Laterality: Left;   INCISION AND DRAINAGE ABSCESS Right 02/23/2024   Procedure: INCISION AND DRAINAGE, ABSCESS;  Surgeon: Tobie Eldora NOVAK, MD;  Location: [facility] OR;  Service: ENT;  Laterality: Right;   INCISION AND DRAINAGE PERIRECTAL ABSCESS Right 08/18/2013   Procedure: IRRIGATION AND DEBRIDEMENT GLUTEAL ABSCESS;  Surgeon: Lynda Leos, MD;  Location: [facility] OR;  Service: General;  Laterality: Right;   INCISION AND DRAINAGE PERIRECTAL  ABSCESS Right 09/19/2013   Procedure: IRRIGATION AND DEBRIDEMENT RIGHT GLUTEAL AND LABIAL ABSCESSES;  Surgeon: Lynda Leos,  MD;  Location: [facility] OR;  Service: General;  Laterality: Right;   INCISION AND DRAINAGE PERIRECTAL ABSCESS Right 09/24/2013   Procedure: IRRIGATION AND DEBRIDEMENT PERIRECTAL ABSCESS;  Surgeon: Lynwood MALVA Pina, MD;  Location: [facility] OR;  Service: General;  Laterality: Right;   IR PARACENTESIS  08/28/2023   IR PARACENTESIS  11/04/2023   IR PARACENTESIS  12/23/2023   IR PARACENTESIS  02/04/2024   IR PARACENTESIS  03/23/2024   IR PARACENTESIS  04/06/2024     Social History:   reports that she has never smoked. She has never been exposed to tobacco smoke. She has never used smokeless tobacco. She reports that she does not currently use alcohol . She reports that she does not use drugs.   Family History:  Her family history includes Asthma in her mother; Carpal tunnel syndrome in her mother; Diabetes in her paternal grandmother; Gout in her father. There is no history of Anesthesia problems.   Allergies Allergies[1]   Home Medications  Prior to Admission medications  Medication Sig Start Date End Date Taking? Authorizing Provider  albuterol  (VENTOLIN  HFA) 108 (90 Base) MCG/ACT inhaler Inhale 2 puffs into the lungs every 4 (four) hours as needed for wheezing or shortness of breath. 11/24/22   [provider]  bumetanide  (BUMEX ) 2 MG tablet Take 10 mg by mouth daily.    [provider]  carvedilol  (COREG ) 25 MG tablet Take 1 tablet (25 mg total) by mouth 2 (two) times daily with a meal. Follow with your PCP for refills. 04/18/24 05/18/24  Perri DELENA Meliton Mickey., MD  Cinnamon 500 MG capsule Take 500 mg by mouth in the morning.    [provider]  DULoxetine  (CYMBALTA ) 20 MG capsule Take 20 mg by mouth daily. 07/29/23   [provider]  Elderberry-Vitamin C-Zinc  (ELDERBERRY IMMUNE HEALTH GUMMY PO) Take 2 each by mouth in the morning.    [provider]  fluticasone  (FLONASE ) 50 MCG/ACT nasal spray Place 2 sprays into both nostrils daily as needed for allergies or  rhinitis. 12/11/23 12/10/24  [provider]  hydrOXYzine  (ATARAX ) 25 MG tablet Take 25 mg by mouth 3 (three) times daily as needed for anxiety, itching, nausea or vomiting.    [provider]  lamoTRIgine  (LAMICTAL ) 25 MG tablet Take 25 mg (1 tablet) daily for 10 days.   Then take 50 mg (2 tablets) daily for 14 days.   Then take 100 mg (4 tablets) daily for 7 days.   Then take 100 mg (4 tablets) in the morning and 25 mg (1 tablet) at night for 7 days.   Then take 100 mg (4 tablets) in the morning and 50 mg (2 tablets) at night for 7 days.   Then take 100 mg (4 tablets) twice a day.  Continue this dose.  Follow with your PCP for refills. 04/18/24   Perri DELENA Meliton Mickey., MD  LANTUS  SOLOSTAR 100 UNIT/ML Solostar Pen Inject 5 Units into the skin daily. 04/18/24   Perri DELENA Meliton Mickey., MD  levETIRAcetam  (KEPPRA ) 250 MG tablet Take 2 tablets (500 mg total) by mouth every morning AND 1 tablet (250 mg total) every Tuesday, Thursday, and Saturday at 6 PM. (After dialysis). 04/18/24 05/30/24  Perri DELENA Meliton Mickey., MD  NOVOLOG  FLEXPEN 100 UNIT/ML FlexPen Before each meal 3 times a day, 140-199 - 2 units, 200-250 - 4 units, 251-299 - 6  units,  300-349 - 7 units,  350 or above 8 units. 03/30/24   Dennise Lavada POUR, MD  OLANZapine  (ZYPREXA ) 5 MG tablet Take 5 mg by mouth at bedtime.    [provider]  Pancrelipase , Lip-Prot-Amyl, 3000-9500 units CPEP Take 3,000 Units by mouth in the morning, at noon, and at bedtime.    [provider]  pantoprazole  (PROTONIX ) 40 MG tablet Take 1 tablet (40 mg total) by mouth 2 (two) times daily. 02/07/24 04/10/24  Drusilla Sabas RAMAN, MD  prochlorperazine  (COMPAZINE ) 5 MG tablet Take 5 mg by mouth every 6 (six) hours as needed for nausea or vomiting.    [provider]  [Paused] rosuvastatin  (CRESTOR ) 40 MG tablet Take 40 mg by mouth daily. Wait to take this until your doctor or other care provider tells you to start again. 01/20/24  01/19/25  [provider]  sacubitril -valsartan  (ENTRESTO ) 24-26 MG Take 1 tablet by mouth 2 (two) times daily. Follow with your PCP/outpatient provider for refills 04/18/24 05/18/24  Perri DELENA Meliton Mickey., MD  sevelamer  carbonate (RENVELA ) 800 MG tablet Take 1 tablet (800 mg total) by mouth 3 (three) times daily with meals. 02/07/24   Drusilla Sabas RAMAN, MD  sodium bicarbonate  650 MG tablet Take 650 mg by mouth 2 (two) times daily. Patient not taking: Reported on 04/07/2024 07/29/23   [provider]  SUMAtriptan  (IMITREX ) 50 MG tablet Take 50 mg by mouth every 2 (two) hours as needed for migraine or headache. 06/20/22   [provider]  VELTASSA  16.8 g PACK Take 1 Dose by mouth daily at 12 noon. 04/03/24   [provider]     Critical care time: 60 min     The patient is critically ill with multiple organ systems failure and requires high complexity decision making for assessment and support, frequent evaluation and titration of therapies, application of advanced monitoring technologies and extensive interpretation of multiple databases.  Independent Critical Care Time: 60 Minutes.   Slater Staff, M.D. [facility] Pulmonary/Critical Care Medicine 04/23/2024 3:38 PM   Please see Amion for pager number to reach on-call Pulmonary and Critical Care Team.            [1]  Allergies Allergen Reactions   Keflex  [Cephalexin ] Anaphylaxis    Ceftriaxone  in the past with no reaction   Penicillins Anaphylaxis, Hives and Rash   Vibramycin  [Doxycycline ] Anaphylaxis   Benadryl  [Diphenhydramine ] Itching   Dilaudid  [Hydromorphone ] Itching   Methotrexate And Trimetrexate Rash   Roxicodone  [Oxycodone ] Itching    Takes Percocet without issue   "

## 2024-04-23 NOTE — Progress Notes (Signed)
 Unable to obtain Hemodialysis consent due to emergent nature of procedure per Dr Upton/Nephology . Patient's mother Rutherford Dempwhqpzoi([phone])eventually returned our call and has agreed with the course of action.

## 2024-04-23 NOTE — Progress Notes (Signed)
 Brief nephrology note:  I just received a consult for this ESRD patient with hyperkalemia.  I've dialysis and discussed with the nurse.  Management of DKA per primary team.  Full consult note to follow.

## 2024-04-23 NOTE — Progress Notes (Signed)
 Writer is here to initiate HD. Patient is pinned to 4N. Writer will await information from assigned 4N RN regarding patient arrival to 4N15.

## 2024-04-23 NOTE — ED Notes (Signed)
 Continued insulin  rate 4.8 unit/h per [employer]

## 2024-04-23 NOTE — ED Notes (Signed)
 Nt called ccmd@10 :07am

## 2024-04-23 NOTE — H&P (Addendum)
 " History and Physical    Patient: Ashley Freeman FMW:[id number] DOB: May 16, 1990 DOA: 04/23/2024 DOS: the patient was seen and examined on 04/23/2024 PCP: Keven Crumbly Pap, MD  Patient coming from: Home via EMS  Chief Complaint:  Chief Complaint  Patient presents with   Hyperglycemia   Fatigue   HPI: Ashley Freeman is a 33 y.o. female with medical history significant of ESRD on HD, poorly controlled type 1 diabetes mellitus, bipolar disorder, and noncompliance presents with generalized pain.  Patient is a poor historian at this time.  Review of records note patient had just recently been hospitalized from 12/15-12/20 after having witnessed seizure after recently being admitted on 12/10 for DKA and recurrent ascites.  During hospital stay patient was seen by neurology with recommendations of titrating Lamictal  back up and then stopping Keppra .   She experiences generalized pain described as 'everywhere.' The onset and duration of the pain are unspecified.  She complains of weakness and malaise for the last 3 days.    She is on a dialysis schedule of Tuesday, Thursday, and Friday. Her last dialysis session is unclear, but was previously reported as yesterday.  Patient states that she has been taking her insulin  as prescribed.  No vomiting.  En route with EMS patient refused all treatments.  In the emergency department patient was noted to have stable vital signs.  Labs noted WBC 10.4, sodium 113, potassium >7.5, CO2 less than 7, BUN 88, glucose greater than 1200,  anion gap unable to be calculated, alkaline phosphatase 722, AST 59, and ALT 133.  Interval decrease in diffuse right-sided and retrocardiac left base airspace disease with no focal consolidative airspace disease on current study.  Patient had been given a bolus of 500 mL of normal saline IV fluids and started on insulin  drip per protocol.   Review of Systems: As mentioned in the history of present  illness. All other systems reviewed and are negative. Past Medical History:  Diagnosis Date   Abscess, gluteal, right 08/24/2013   Anemia 02/19/2012   Bartholin's gland abscess 09/19/2013   Bipolar disorder (HCC)    BV (bacterial vaginosis) 11/24/2015   Depression    Diabetes mellitus type I (HCC) 2001   Diagnosed at age 4 ; Type I   Diarrhea 05/30/2016   DKA (diabetic ketoacidoses) 08/19/2013   Also in 2018   ESRD (end stage renal disease) (HCC)    Gonorrhea 08/2011   Treated in 09/2011   HFrEF (heart failure with reduced ejection fraction) (HCC)    a. 2022 Echo: EF 40%; b. 10/2021 Echo: EF 55%; b. 07/2022 MV: No ischemia. EF 31%; c. 08/2022 Echo: EF 35%, mildly dil RV, sev TR.   History of trichomoniasis 05/31/2016   Hyperlipidemia 03/28/2016   Hypertension    NICM (nonischemic cardiomyopathy) (HCC)    Sepsis (HCC) 09/19/2013   Past Surgical History:  Procedure Laterality Date   A/V FISTULAGRAM Right 06/17/2023   Procedure: A/V Fistulagram;  Surgeon: Marea Selinda GORMAN, MD;  Location: [facility] INVASIVE CV LAB;  Service: Cardiovascular;  Laterality: Right;   A/V SHUNT INTERVENTION N/A 09/25/2023   Procedure: A/V SHUNT INTERVENTION;  Surgeon: Pearline Norman GORMAN, MD;  Location: [facility] LAB;  Service: Cardiovascular;  Laterality: N/A;   AV FISTULA PLACEMENT Right 07/06/2022   Procedure: ARTERIOVENOUS GRAFT CREATION;  Surgeon: Gretta Lonni PARAS, MD;  Location: [facility] OR;  Service: Vascular;  Laterality: Right;   CESAREAN SECTION N/A 10/05/2019   Procedure: CESAREAN SECTION;  Surgeon: Izell Harari,  MD;  Location: [facility] LD ORS;  Service: Obstetrics;  Laterality: N/A;   CHOLECYSTECTOMY N/A 07/02/2023   Procedure: LAPAROSCOPIC CHOLECYSTECTOMY;  Surgeon: Ebbie Cough, MD;  Location: [facility] OR;  Service: General;  Laterality: N/A;   ESOPHAGOGASTRODUODENOSCOPY N/A 01/31/2024   Procedure: EGD (ESOPHAGOGASTRODUODENOSCOPY);  Surgeon: San Sandor GAILS, DO;  Location[facility] ENDOSCOPY;  Service:  Gastroenterology;  Laterality: N/A;   INCISION AND DRAINAGE ABSCESS Left 09/28/2019   Procedure: INCISION AND DRAINAGE VULVAR ABCESS;  Surgeon: Edsel Norleen GAILS, MD;  Location: [facility] OR;  Service: Gynecology;  Laterality: Left;   INCISION AND DRAINAGE ABSCESS Right 02/23/2024   Procedure: INCISION AND DRAINAGE, ABSCESS;  Surgeon: Tobie Eldora NOVAK, MD;  Location: [facility] OR;  Service: ENT;  Laterality: Right;   INCISION AND DRAINAGE PERIRECTAL ABSCESS Right 08/18/2013   Procedure: IRRIGATION AND DEBRIDEMENT GLUTEAL ABSCESS;  Surgeon: Lynda Leos, MD;  Location: [facility] OR;  Service: General;  Laterality: Right;   INCISION AND DRAINAGE PERIRECTAL ABSCESS Right 09/19/2013   Procedure: IRRIGATION AND DEBRIDEMENT RIGHT GLUTEAL AND LABIAL ABSCESSES;  Surgeon: Lynda Leos, MD;  Location: [facility] OR;  Service: General;  Laterality: Right;   INCISION AND DRAINAGE PERIRECTAL ABSCESS Right 09/24/2013   Procedure: IRRIGATION AND DEBRIDEMENT PERIRECTAL ABSCESS;  Surgeon: Lynwood MALVA Pina, MD;  Location: [facility] OR;  Service: General;  Laterality: Right;   IR PARACENTESIS  08/28/2023   IR PARACENTESIS  11/04/2023   IR PARACENTESIS  12/23/2023   IR PARACENTESIS  02/04/2024   IR PARACENTESIS  03/23/2024   IR PARACENTESIS  04/06/2024   Social History:  reports that she has never smoked. She has never been exposed to tobacco smoke. She has never used smokeless tobacco. She reports that she does not currently use alcohol . She reports that she does not use drugs.  Allergies[1]  Family History  Problem Relation Age of Onset   Asthma Mother    Carpal tunnel syndrome Mother    Gout Father    Diabetes Paternal Grandmother    Anesthesia problems Neg Hx     Prior to Admission medications  Medication Sig Start Date End Date Taking? Authorizing Provider  albuterol  (VENTOLIN  HFA) 108 (90 Base) MCG/ACT inhaler Inhale 2 puffs into the lungs every 4 (four) hours as needed for wheezing or shortness of breath. 11/24/22   [provider]  bumetanide  (BUMEX ) 2 MG tablet Take 10 mg by mouth daily.    [provider]  carvedilol  (COREG ) 25 MG tablet Take 1 tablet (25 mg total) by mouth 2 (two) times daily with a meal. Follow with your PCP for refills. 04/18/24 05/18/24  Perri DELENA Meliton Mickey., MD  Cinnamon 500 MG capsule Take 500 mg by mouth in the morning.    [provider]  DULoxetine  (CYMBALTA ) 20 MG capsule Take 20 mg by mouth daily. 07/29/23   [provider]  Elderberry-Vitamin C-Zinc  (ELDERBERRY IMMUNE HEALTH GUMMY PO) Take 2 each by mouth in the morning.    [provider]  fluticasone  (FLONASE ) 50 MCG/ACT nasal spray Place 2 sprays into both nostrils daily as needed for allergies or rhinitis. 12/11/23 12/10/24  [provider]  hydrOXYzine  (ATARAX ) 25 MG tablet Take 25 mg by mouth 3 (three) times daily as needed for anxiety, itching, nausea or vomiting.    [provider]  lamoTRIgine  (LAMICTAL ) 25 MG tablet Take 25 mg (1 tablet) daily for 10 days.   Then take 50 mg (2 tablets) daily for 14 days.   Then take 100 mg (4 tablets) daily  for 7 days.   Then take 100 mg (4 tablets) in the morning and 25 mg (1 tablet) at night for 7 days.   Then take 100 mg (4 tablets) in the morning and 50 mg (2 tablets) at night for 7 days.   Then take 100 mg (4 tablets) twice a day.  Continue this dose.  Follow with your PCP for refills. 04/18/24   Perri DELENA Meliton Mickey., MD  LANTUS  SOLOSTAR 100 UNIT/ML Solostar Pen Inject 5 Units into the skin daily. 04/18/24   Perri DELENA Meliton Mickey., MD  levETIRAcetam  (KEPPRA ) 250 MG tablet Take 2 tablets (500 mg total) by mouth every morning AND 1 tablet (250 mg total) every Tuesday, Thursday, and Saturday at 6 PM. (After dialysis). 04/18/24 05/30/24  Perri DELENA Meliton Mickey., MD  NOVOLOG  FLEXPEN 100 UNIT/ML FlexPen Before each meal 3 times a day, 140-199 - 2 units, 200-250 - 4 units, 251-299 - 6 units,  300-349 - 7 units,  350 or above 8 units. 03/30/24   Singh,  Prashant K, MD  OLANZapine  (ZYPREXA ) 5 MG tablet Take 5 mg by mouth at bedtime.    [provider]  Pancrelipase , Lip-Prot-Amyl, 3000-9500 units CPEP Take 3,000 Units by mouth in the morning, at noon, and at bedtime.    [provider]  pantoprazole  (PROTONIX ) 40 MG tablet Take 1 tablet (40 mg total) by mouth 2 (two) times daily. 02/07/24 04/10/24  Drusilla Sabas RAMAN, MD  prochlorperazine  (COMPAZINE ) 5 MG tablet Take 5 mg by mouth every 6 (six) hours as needed for nausea or vomiting.    [provider]  [Paused] rosuvastatin  (CRESTOR ) 40 MG tablet Take 40 mg by mouth daily. Wait to take this until your doctor or other care provider tells you to start again. 01/20/24 01/19/25  [provider]  sacubitril -valsartan  (ENTRESTO ) 24-26 MG Take 1 tablet by mouth 2 (two) times daily. Follow with your PCP/outpatient provider for refills 04/18/24 05/18/24  Perri DELENA Meliton Mickey., MD  sevelamer  carbonate (RENVELA ) 800 MG tablet Take 1 tablet (800 mg total) by mouth 3 (three) times daily with meals. 02/07/24   Drusilla Sabas RAMAN, MD  sodium bicarbonate  650 MG tablet Take 650 mg by mouth 2 (two) times daily. Patient not taking: Reported on 04/07/2024 07/29/23   [provider]  SUMAtriptan  (IMITREX ) 50 MG tablet Take 50 mg by mouth every 2 (two) hours as needed for migraine or headache. 06/20/22   [provider]  VELTASSA  16.8 g PACK Take 1 Dose by mouth daily at 12 noon. 04/03/24   [provider]    Physical Exam: Vitals:   04/23/24 0915  BP: (!) 152/77  Pulse: 87  Resp: 14  SpO2: 99%   Constitutional: Chronically ill-appearing young female Eyes: PERRL, lids and conjunctivae normal ENMT: Mucous membranes are dry.  Fair dentition Neck: normal, supple  Respiratory: clear to auscultation bilaterally, no wheezing, no crackles.  Cardiovascular: Regular rate and rhythm, no murmurs / rubs / gallops.  Trace lower extremity edema.  Fistula present of the right  upper extremity with palpable thrill Abdomen: Abdominal distention. Bowel sounds positive.  Musculoskeletal: no clubbing / cyanosis. No joint deformity upper and lower extremities. Normal muscle tone.  Skin: no rashes, lesions, ulcers. No induration Neurologic: CN 2-12 grossly intact.  Moves all extremities Psychiatric: Lethargic  Data Reviewed:  EKG reveals sinus rhythm 85 bpm with QTc 497.  Reviewed labs, imaging, and pertinent records as documented.  Assessment and Plan:  Diabetic ketoacidosis associated  with type 1 diabetes Acute.  Patient presented with initial glucose greater than 1200, CO2 less than 7, and anion gap unable to be calculated.  Venous pH was noted to be significantly acidotic at 7.185.  Patient was bolused 500 mL of normal saline IV fluids and placed on insulin  drip per hyperglycemia protocol. - Admit to a progressive bed on changed to ICU bed due to need of closer monitoring PCCM notified - Serial monitoring of BMP evaluate for closure of anion gap - Hyperglycemia protocol with insulin  drip - Reduce IV fluid rate to 75 mL/h - Plan to transition off insulin  drip once deemed medically appropriate  Hyponatremia Hyperkalemia End-stage renal disease on hemodialysis Acute.  Potassium initially noted to be elevated greater than 7.5.  On venous blood gas obtained 1 hour later potassium noted to be 7.1.  Patient had been given IV fluids started on insulin  drip.  Reported last hemodialysis session was yesterday but not totally clear.  Labs noted sodium 113, CO2 less than 7, and anion gap unable to be calculated. - Lokelma  - Nephrology consulted for need of hemodialysis  Heart failure with reduced ejection fraction Last echocardiogram noted EF to be 30 to 35% in 08/2023. - Fluid management with dialysis  Recurrent ascites with transaminitis Patient noted to have significant abdominal distention present.  Alkaline phosphatase 722, AST 59, and ALT 130. - Consider need of  paracentesis after patient able to be transitioned off insulin  drip  Seizure disorder  While in the ED patient was noted to have concerns for seizure-like activity - Seizure precautions - Continue Keppra  and Lamictal  for previous recommendations - Ativan  IV as needed for seizure activity - Neurology consulted to evaluate, to evaluate  Prolonged QT interval Chronic.  QTc 497. - Avoid QT prolonging medications  Bipolar disorder - Resume Cymbalta  and Zyprexa  was able  Dyslipidemia - Resume Crestor  once a  DVT prophylaxis: Heparin  Advance Care Planning:   Code Status: Full Code    Consults: Nephrology  Family Communication: none  Severity of Illness: The appropriate patient status for this patient is OBSERVATION. Observation status is judged to be reasonable and necessary in order to provide the required intensity of service to ensure the patient's safety. The patient's presenting symptoms, physical exam findings, and initial radiographic and laboratory data in the context of their medical condition is felt to place them at decreased risk for further clinical deterioration. Furthermore, it is anticipated that the patient will be medically stable for discharge from the hospital within 2 midnights of admission.   Author: Maximino DELENA Sharps, MD 04/23/2024 10:44 AM  For on call review [email or web address].      [1]  Allergies Allergen Reactions   Keflex  [Cephalexin ] Anaphylaxis    Ceftriaxone  in the past with no reaction   Penicillins Anaphylaxis, Hives and Rash   Vibramycin  [Doxycycline ] Anaphylaxis   Benadryl  [Diphenhydramine ] Itching   Dilaudid  [Hydromorphone ] Itching   Methotrexate And Trimetrexate Rash   Roxicodone  [Oxycodone ] Itching    Takes Percocet without issue   "

## 2024-04-23 NOTE — Progress Notes (Signed)
 Date and time results received: 04/23/2024 1834   Test: potassium  Critical Value: 6.6  Name of Provider Notified: Kassie  Orders Received? Or Actions Taken?:       Date and time results received: 04/23/2024 1834   Test: Glucose Critical Value: 904  Name of Provider Notified: Kassie  Orders Received? Or Actions Taken?:

## 2024-04-23 NOTE — Progress Notes (Signed)
 IV Team rec'd a STAT consult for a 2nd IV site; upon entering the pt's room, pt's eyelids and mouth were twitching, as if some type of seizure like activity;  RN and MD notified; 2nd IV started in LUA with ultrasound; suggest central access if pt loses these 2 iv sites in the left arm.

## 2024-04-23 NOTE — ED Notes (Signed)
 Insulin  remain 4.8 per [employer]

## 2024-04-23 NOTE — Procedures (Signed)
 Intubation Procedure Note  AKILI CUDA  [id number]  July 07, 1990  Date:04/23/2024  Time:4:27 PM   Provider Performing:Evalee Gerard Slater Staff MD   Procedure: Intubation (31500)  Indication(s) Respiratory Failure  Consent Unable to obtain consent due to emergent nature of procedure.   Anesthesia Etomidate , Versed , Fentanyl , and Rocuronium    Time Out Verified patient identification, verified procedure, site/side was marked, verified correct patient position, special equipment/implants available, medications/allergies/relevant history reviewed, required imaging and test results available.   Sterile Technique Usual hand hygeine, masks, and gloves were used   Procedure Description Patient positioned in bed supine.  Sedation given as noted above.  Patient was intubated with endotracheal tube using Glidescope.  View was Grade 1 full glottis .  Number of attempts was 1.  Colorimetric CO2 detector was consistent with tracheal placement.   Complications/Tolerance None; patient tolerated the procedure well. Chest X-ray is ordered to verify placement.   EBL Minimal   Specimen(s) None

## 2024-04-23 NOTE — Consult Note (Signed)
 Reason for Consult: To manage dialysis and dialysis related needs Referring Physician: Dr Smith/ Kassie Aldean RAMAN Ashley Freeman is an 33 y.o. female.   HPI: Pt is a 83F with ESRD on HD, DM I, HFrEF, noncompliance who is now seen in consultation at the request of Dr. Teri Kassie for managemetn of ESRD/ provision of dialysis.  Was just here and d/c'd on 12/21 for DKA and severe hyperglycemia.   She presents with the same.  Reportedly went to HD yesterday (holiday schedule) but unclear.  On admission, K 7.6, glucose > 1200.  Started on insulin  gtt and had bed on 5W but then developed seizures/ vomiting.  PCCM involved, plan for intubation and transfer to 4N.    Pt has some facial twitching- not able to provide history.  Large amount of emesis.      Dialysis Orders: [facility] ([address] [phone]) 3:45hr, 400/800, EDW 57.5kg, 2K/2.5Ca, R AVG Heparin  bolus 1600 units with HD Calcitriol  1mcg Sensipar  60mg  with HD  TUMS 1500mg  with HD   Past Medical History:  Diagnosis Date   Abscess, gluteal, right 08/24/2013   Anemia 02/19/2012   Bartholin's gland abscess 09/19/2013   Bipolar disorder (HCC)    BV (bacterial vaginosis) 11/24/2015   Depression    Diabetes mellitus type I (HCC) 2001   Diagnosed at age 68 ; Type I   Diarrhea 05/30/2016   DKA (diabetic ketoacidoses) 08/19/2013   Also in 2018   ESRD (end stage renal disease) (HCC)    Gonorrhea 08/2011   Treated in 09/2011   HFrEF (heart failure with reduced ejection fraction) (HCC)    a. 2022 Echo: EF 40%; b. 10/2021 Echo: EF 55%; b. 07/2022 MV: No ischemia. EF 31%; c. 08/2022 Echo: EF 35%, mildly dil RV, sev TR.   History of trichomoniasis 05/31/2016   Hyperlipidemia 03/28/2016   Hypertension    NICM (nonischemic cardiomyopathy) (HCC)    Sepsis (HCC) 09/19/2013    Past Surgical History:  Procedure Laterality Date   A/V FISTULAGRAM Right 06/17/2023   Procedure: A/V Fistulagram;  Surgeon: Marea Selinda RAMAN, MD;  Location: [facility] INVASIVE CV LAB;  Service: Cardiovascular;  Laterality: Right;   A/V SHUNT INTERVENTION N/A 09/25/2023   Procedure: A/V SHUNT INTERVENTION;  Surgeon: Pearline Norman RAMAN, MD;  Location: [facility] PV LAB;  Service: Cardiovascular;  Laterality: N/A;   AV FISTULA PLACEMENT Right 07/06/2022   Procedure: ARTERIOVENOUS GRAFT CREATION;  Surgeon: Gretta Lonni PARAS, MD;  Location: [facility] OR;  Service: Vascular;  Laterality: Right;   CESAREAN SECTION N/A 10/05/2019   Procedure: CESAREAN SECTION;  Surgeon: Izell Harari, MD;  Location: [facility] LD ORS;  Service: Obstetrics;  Laterality: N/A;   CHOLECYSTECTOMY N/A 07/02/2023   Procedure: LAPAROSCOPIC CHOLECYSTECTOMY;  Surgeon: Ebbie Cough, MD;  Location: [facility] OR;  Service: General;  Laterality: N/A;   ESOPHAGOGASTRODUODENOSCOPY N/A 01/31/2024   Procedure: EGD (ESOPHAGOGASTRODUODENOSCOPY);  Surgeon: San Sandor GAILS, DO;  Location: [facility] ENDOSCOPY;  Service: Gastroenterology;  Laterality: N/A;   INCISION AND DRAINAGE ABSCESS Left 09/28/2019   Procedure: INCISION AND DRAINAGE VULVAR ABCESS;  Surgeon: Edsel Norleen GAILS, MD;  Location: [facility] OR;  Service: Gynecology;  Laterality: Left;   INCISION AND DRAINAGE ABSCESS Right 02/23/2024   Procedure: INCISION AND DRAINAGE, ABSCESS;  Surgeon: Tobie Eldora NOVAK, MD;  Location: [facility] OR;  Service: ENT;  Laterality: Right;   INCISION AND DRAINAGE PERIRECTAL ABSCESS Right 08/18/2013   Procedure: IRRIGATION AND DEBRIDEMENT GLUTEAL ABSCESS;  Surgeon: Lynda Leos, MD;  Location: [facility] OR;  Service: General;  Laterality: Right;   INCISION AND DRAINAGE PERIRECTAL ABSCESS Right 09/19/2013   Procedure: IRRIGATION AND DEBRIDEMENT RIGHT GLUTEAL AND LABIAL ABSCESSES;  Surgeon: Lynda Leos, MD;  Location: [facility] OR;  Service: General;  Laterality: Right;   INCISION AND DRAINAGE PERIRECTAL ABSCESS Right 09/24/2013   Procedure: IRRIGATION AND DEBRIDEMENT PERIRECTAL ABSCESS;  Surgeon: Lynwood MALVA Pina, MD;  Location: [facility] OR;   Service: General;  Laterality: Right;   IR PARACENTESIS  08/28/2023   IR PARACENTESIS  11/04/2023   IR PARACENTESIS  12/23/2023   IR PARACENTESIS  02/04/2024   IR PARACENTESIS  03/23/2024   IR PARACENTESIS  04/06/2024    Family History  Problem Relation Age of Onset   Asthma Mother    Carpal tunnel syndrome Mother    Gout Father    Diabetes Paternal Grandmother    Anesthesia problems Neg Hx     Social History:  reports that she has never smoked. She has never been exposed to tobacco smoke. She has never used smokeless tobacco. She reports that she does not currently use alcohol . She reports that she does not use drugs.  Allergies: Allergies[1]  Medications: Scheduled:  etomidate        fentaNYL        midazolam        rocuronium        rocuronium        Chlorhexidine  Gluconate Cloth  6 each Topical Q0600   heparin   5,000 Units Subcutaneous Q8H   lamoTRIgine   25 mg Oral See admin instructions   levETIRAcetam   500 mg Oral Daily   And   [START ON 04/25/2024] levETIRAcetam   250 mg Oral Q T,Th,Sat-1800   sodium chloride  flush  3 mL Intravenous Q12H   sodium zirconium cyclosilicate   10 g Oral Once     Results for orders placed or performed during the hospital encounter of 04/23/24 (from the past 48 hours)  CBC with Differential     Status: Abnormal   Collection Time: 04/23/24  9:10 AM  Result Value Ref Range   WBC 10.4 4.0 - 10.5 K/uL   RBC 3.95 3.87 - 5.11 MIL/uL   Hemoglobin 10.5 (L) 12.0 - 15.0 g/dL   HCT 60.4 63.9 - 53.9 %   MCV 100.0 80.0 - 100.0 fL   MCH 26.6 26.0 - 34.0 pg   MCHC 26.6 (L) 30.0 - 36.0 g/dL   RDW 84.3 (H) 88.4 - 84.4 %   Platelets 222 150 - 400 K/uL   nRBC 0.0 0.0 - 0.2 %   Neutrophils Relative % 72 %   Neutro Abs 7.5 1.7 - 7.7 K/uL   Lymphocytes Relative 17 %   Lymphs Abs 1.7 0.7 - 4.0 K/uL   Monocytes Relative 9 %   Monocytes Absolute 0.9 0.1 - 1.0 K/uL   Eosinophils Relative 1 %   Eosinophils Absolute 0.1 0.0 - 0.5 K/uL   Basophils Relative 0 %    Basophils Absolute 0.0 0.0 - 0.1 K/uL   Immature Granulocytes 1 %   Abs Immature Granulocytes 0.09 (H) 0.00 - 0.07 K/uL    Comment: Performed at [facility], [address], [place], [place] [address]  Comprehensive metabolic panel     Status: Abnormal   Collection Time: 04/23/24  9:10 AM  Result Value Ref Range   Sodium 113 (LL) 135 - 145 mmol/L    Comment: Critical Value, Read Back and verified with CHELSEA SCHILLING RN.@1027  ON 12.25.25 BY TCALDWELL MLS.   Potassium >7.5 (HH) 3.5 -  5.1 mmol/L    Comment: HEMOLYSIS AT THIS LEVEL MAY AFFECT RESULT Critical Value, Read Back and verified with CHELSEA SCHILLING RN.@1027  ON 12.25.25 BY TCALDWELL MLS.    Chloride 79 (L) 98 - 111 mmol/L   CO2 <7 (L) 22 - 32 mmol/L   Glucose, Bld >1,200 (HH) 70 - 99 mg/dL    Comment: Repeated to verify  Critical Value, Read Back and verified with CHELSEA SCHILLING RN.@1034  ON 12.25.25 BY TCALDWELL MLS. Glucose reference range applies only to samples taken after fasting for at least 8 hours.    BUN 88 (H) 6 - 20 mg/dL   Creatinine, Ser 3.20 (H) 0.44 - 1.00 mg/dL   Calcium  8.8 (L) 8.9 - 10.3 mg/dL   Total Protein 5.8 (L) 6.5 - 8.1 g/dL   Albumin  3.6 3.5 - 5.0 g/dL   AST 59 (H) 15 - 41 U/L    Comment: HEMOLYSIS AT THIS LEVEL MAY AFFECT RESULT   ALT 133 (H) 0 - 44 U/L   Alkaline Phosphatase 722 (H) 38 - 126 U/L   Total Bilirubin 0.6 0.0 - 1.2 mg/dL   GFR, Estimated 8 (L) >60 mL/min    Comment: (NOTE) Calculated using the CKD-EPI Creatinine Equation (2021)    Anion gap NOT CALCULATED 5 - 15    Comment: Performed at [facility], [address] [place], [place] [address]  hCG, serum, qualitative     Status: None   Collection Time: 04/23/24  9:10 AM  Result Value Ref Range   Preg, Serum NEGATIVE NEGATIVE    Comment:        THE SENSITIVITY OF THIS METHODOLOGY IS >10 mIU/mL. Performed at [facility], [address], [place], [place] [address]   POC CBG, ED     Status: Abnormal    Collection Time: 04/23/24  9:15 AM  Result Value Ref Range   Glucose-Capillary >600 (HH) 70 - 99 mg/dL    Comment: Glucose reference range applies only to samples taken after fasting for at least 8 hours.  Resp panel by RT-PCR (RSV, Flu A&B, Covid) Anterior Nasal Swab     Status: None   Collection Time: 04/23/24  9:21 AM   Specimen: Anterior Nasal Swab  Result Value Ref Range   SARS Coronavirus 2 by RT PCR NEGATIVE NEGATIVE   Influenza A by PCR NEGATIVE NEGATIVE   Influenza B by PCR NEGATIVE NEGATIVE    Comment: (NOTE) The Xpert Xpress SARS-CoV-2/FLU/RSV plus assay is intended as an aid in the diagnosis of influenza from Nasopharyngeal swab specimens and should not be used as a sole basis for treatment. Nasal washings and aspirates are unacceptable for Xpert Xpress SARS-CoV-2/FLU/RSV testing.  Fact Sheet for Patients: bloggercourse.com  Fact Sheet for Healthcare Providers: seriousbroker.it  This test is not yet approved or cleared by the [place]  FDA and has been authorized for detection and/or diagnosis of SARS-CoV-2 by FDA under an Emergency Use Authorization (EUA). This EUA will remain in effect (meaning this test can be used) for the duration of the COVID-19 declaration under Section 564(b)(1) of the Act, 21 U.S.C. section 360bbb-3(b)(1), unless the authorization is terminated or revoked.     Resp Syncytial Virus by PCR NEGATIVE NEGATIVE    Comment: (NOTE) Fact Sheet for Patients: bloggercourse.com  Fact Sheet for Healthcare Providers: seriousbroker.it  This test is not yet approved or cleared by the [place]  FDA and has been authorized for detection and/or diagnosis of SARS-CoV-2 by FDA under an Emergency Use  Authorization (EUA). This EUA will remain in effect (meaning this test can be used) for the duration of the COVID-19 declaration under Section 564(b)(1)  of the Act, 21 U.S.C. section 360bbb-3(b)(1), unless the authorization is terminated or revoked.  Performed at [facility], [address], [place], [place] [address]   I-stat chem 8, ED (not at [facility], [facility] or [facility])     Status: Abnormal   Collection Time: 04/23/24  9:53 AM  Result Value Ref Range   Sodium 113 (LL) 135 - 145 mmol/L   Potassium 7.6 (HH) 3.5 - 5.1 mmol/L   Chloride 92 (L) 98 - 111 mmol/L   BUN 106 (H) 6 - 20 mg/dL   Creatinine, Ser 2.99 (H) 0.44 - 1.00 mg/dL   Glucose, Bld >299 (HH) 70 - 99 mg/dL    Comment: Glucose reference range applies only to samples taken after fasting for at least 8 hours.   Calcium , Ion 0.98 (L) 1.15 - 1.40 mmol/L   TCO2 10 (L) 22 - 32 mmol/L   Hemoglobin 13.3 12.0 - 15.0 g/dL   HCT 60.9 63.9 - 53.9 %   Comment NOTIFIED PHYSICIAN   I-stat chem 8, ED (not at [facility], [facility] or [facility])     Status: Abnormal   Collection Time: 04/23/24 10:15 AM  Result Value Ref Range   Sodium 114 (LL) 135 - 145 mmol/L   Potassium 7.1 (HH) 3.5 - 5.1 mmol/L   Chloride 93 (L) 98 - 111 mmol/L   BUN 89 (H) 6 - 20 mg/dL   Creatinine, Ser 2.99 (H) 0.44 - 1.00 mg/dL   Glucose, Bld >299 (HH) 70 - 99 mg/dL    Comment: Glucose reference range applies only to samples taken after fasting for at least 8 hours.   Calcium , Ion 1.01 (L) 1.15 - 1.40 mmol/L   TCO2 8 (L) 22 - 32 mmol/L   Hemoglobin 12.9 12.0 - 15.0 g/dL   HCT 61.9 63.9 - 53.9 %   Comment NOTIFIED PHYSICIAN   I-Stat venous blood gas, ([facility] ED, [facility], [facility])     Status: Abnormal   Collection Time: 04/23/24 10:16 AM  Result Value Ref Range   pH, Ven 7.185 (LL) 7.25 - 7.43   pCO2, Ven 16.5 (LL) 44 - 60 mmHg   pO2, Ven 110 (H) 32 - 45 mmHg   Bicarbonate 6.2 (L) 20.0 - 28.0 mmol/L   TCO2 7 (L) 22 - 32 mmol/L   O2 Saturation 97 %   Acid-base deficit 20.0 (H) 0.0 - 2.0 mmol/L   Sodium 113 (LL) 135 - 145 mmol/L   Potassium 7.1 (HH) 3.5 - 5.1 mmol/L   Calcium , Ion 1.00 (L) 1.15 - 1.40 mmol/L   HCT 37.0 36.0 - 46.0 %    Hemoglobin 12.6 12.0 - 15.0 g/dL   Sample type VENOUS    Comment NOTIFIED PHYSICIAN   CBG monitoring, ED     Status: Abnormal   Collection Time: 04/23/24 11:23 AM  Result Value Ref Range   Glucose-Capillary >600 (HH) 70 - 99 mg/dL    Comment: Glucose reference range applies only to samples taken after fasting for at least 8 hours.  CBG monitoring, ED     Status: Abnormal   Collection Time: 04/23/24 11:55 AM  Result Value Ref Range   Glucose-Capillary >600 (HH) 70 - 99 mg/dL    Comment: Glucose reference range applies only to samples taken after fasting for at least 8 hours.  CBG monitoring, ED  Status: Abnormal   Collection Time: 04/23/24 12:26 PM  Result Value Ref Range   Glucose-Capillary >600 (HH) 70 - 99 mg/dL    Comment: Glucose reference range applies only to samples taken after fasting for at least 8 hours.  CBG monitoring, ED     Status: Abnormal   Collection Time: 04/23/24 12:57 PM  Result Value Ref Range   Glucose-Capillary >600 (HH) 70 - 99 mg/dL    Comment: Glucose reference range applies only to samples taken after fasting for at least 8 hours.  CBG monitoring, ED     Status: Abnormal   Collection Time: 04/23/24  1:47 PM  Result Value Ref Range   Glucose-Capillary >600 (HH) 70 - 99 mg/dL    Comment: Glucose reference range applies only to samples taken after fasting for at least 8 hours.  CBG monitoring, ED     Status: Abnormal   Collection Time: 04/23/24  2:30 PM  Result Value Ref Range   Glucose-Capillary >600 (HH) 70 - 99 mg/dL    Comment: Glucose reference range applies only to samples taken after fasting for at least 8 hours.   *Note: Due to a large number of results and/or encounters for the requested time period, some results have not been displayed. A complete set of results can be found in Results Review.    DG Chest 2 View Result Date: 04/23/2024 CLINICAL DATA:  Recent pneumonia.  Fatigue. EXAM: CHEST - 2 VIEW COMPARISON:  04/13/2024 FINDINGS: Low  volume film. The cardio pericardial silhouette is enlarged. Diffuse right-sided and retrocardiac left base airspace disease seen previously has decreased in the interval. No dense focal consolidative airspace disease on the current study. No substantial pleural effusion. No acute bony abnormality. Telemetry leads overlie the chest. IMPRESSION: Interval decrease in diffuse right-sided and retrocardiac left base airspace disease. No dense focal consolidative airspace disease on the current study. Electronically Signed   By: Camellia Candle M.D.   On: 04/23/2024 10:12    ROS: unable to obtain ROS Blood pressure (!) 180/87, pulse 88, temperature (!) 97.5 F (36.4 C), temperature source Axillary, resp. rate 20, SpO2 99%. GEN ill-appearing HEENT + periobital edema NECK + JVD PULM coarse bilaterally CV tachycardic ABD + appears like ascited EXT no LE edema NEURO +facial twitching ACCESS: AVG + Bruit  Assessment/Plan: 1 Seizures: likely 2/2 HTN/ DKA, on keppra / ativan .  Per primary  Getting intubated 2.  DKA: on insulin  gtt 3 ESRD: HD urgently today 4 Hypertension: expect to improve with UF- would resume carvedilol  after HD-may need to find another agent besides entresto  given severe recurrent hyperkalemia 5. Anemia of ESRD: Not an issue today 6. Metabolic Bone Disease: TUMS when eating 7.  For intubation and then ICU  Jess Toney 04/23/2024, 3:04 PM         [1]  Allergies Allergen Reactions   Keflex  [Cephalexin ] Anaphylaxis    Ceftriaxone  in the past with no reaction   Penicillins Anaphylaxis, Hives and Rash   Vibramycin  [Doxycycline ] Anaphylaxis   Benadryl  [Diphenhydramine ] Itching   Dilaudid  [Hydromorphone ] Itching   Methotrexate And Trimetrexate Rash   Roxicodone  [Oxycodone ] Itching    Takes Percocet without issue

## 2024-04-23 NOTE — Progress Notes (Signed)
 Patient transported from ED Trauma 26 to 4N15 with RT and RN, no complications noted.

## 2024-04-23 NOTE — Progress Notes (Signed)
" °   04/23/24 2030  Vitals  Temp 97.8 F (36.6 C)  Temp Source Axillary  BP (!) 159/72  MAP (mmHg) 95  Pulse Rate 84  ECG Heart Rate 85  Resp 18  Oxygen Therapy  SpO2 100 %  O2 Device Ventilator  During Treatment Monitoring  Blood Flow Rate (mL/min) 399 mL/min  Arterial Pressure (mmHg) -174.33 mmHg  Venous Pressure (mmHg) 238.37 mmHg  TMP (mmHg) -0.4 mmHg  Ultrafiltration Rate (mL/min) 399 mL/min  Dialysate Flow Rate (mL/min) 299 ml/min  Dialysate Potassium Concentration 2  Dialysate Calcium  Concentration 2.5  Duration of HD Treatment -hour(s) 3.42 hour(s)  Cumulative Fluid Removed (mL) per Treatment  -32.9  HD Safety Checks Performed Yes  Intra-Hemodialysis Comments Progressing as prescribed  Post Treatment  Dialyzer Clearance Lightly streaked  Liters Processed 84  Fluid Removed (mL) 0 mL  Tolerated HD Treatment Yes  AVG/AVF Arterial Site Held (minutes) 10 minutes  AVG/AVF Venous Site Held (minutes) 10 minutes  Fistula / Graft Right Upper arm Arteriovenous vein graft  Placement Date/Time: 07/06/22 1013   Placed prior to admission: No  Orientation: Right  Access Location: Upper arm  Access Type: Arteriovenous vein graft  Site Condition No complications  Fistula / Graft Assessment Present;Thrill;Bruit  Status Deaccessed  Needle Size 15  Drainage Description None    "

## 2024-04-23 NOTE — ED Provider Notes (Signed)
 " [place] EMERGENCY DEPARTMENT AT [facility] Provider Note   CSN: [id number] Arrival date & time: 04/23/24  0901     Patient presents with: Hyperglycemia and Fatigue   Ashley Freeman is a 33 y.o. female.   33 year old female presenting emergency department with generalized weakness and malaise x 2 days.  Poor historian.  Reportedly compliant with medications.  She has ESRD on dialysis with yesterday for full session.  Reports that she hurts all over EMS reported that she has not been keeping up with her diabetes medications, but patient reports that she has been taking her insulin .   Hyperglycemia      Prior to Admission medications  Medication Sig Start Date End Date Taking? Authorizing Provider  albuterol  (VENTOLIN  HFA) 108 (90 Base) MCG/ACT inhaler Inhale 2 puffs into the lungs every 4 (four) hours as needed for wheezing or shortness of breath. 11/24/22   [provider]  bumetanide  (BUMEX ) 2 MG tablet Take 10 mg by mouth daily.    [provider]  carvedilol  (COREG ) 25 MG tablet Take 1 tablet (25 mg total) by mouth 2 (two) times daily with a meal. Follow with your PCP for refills. 04/18/24 05/18/24  Perri DELENA Meliton Mickey., MD  Cinnamon 500 MG capsule Take 500 mg by mouth in the morning.    [provider]  DULoxetine  (CYMBALTA ) 20 MG capsule Take 20 mg by mouth daily. 07/29/23   [provider]  Elderberry-Vitamin C-Zinc  (ELDERBERRY IMMUNE HEALTH GUMMY PO) Take 2 each by mouth in the morning.    [provider]  fluticasone  (FLONASE ) 50 MCG/ACT nasal spray Place 2 sprays into both nostrils daily as needed for allergies or rhinitis. 12/11/23 12/10/24  [provider]  hydrOXYzine  (ATARAX ) 25 MG tablet Take 25 mg by mouth 3 (three) times daily as needed for anxiety, itching, nausea or vomiting.    [provider]  lamoTRIgine  (LAMICTAL ) 25 MG tablet Take 25 mg (1 tablet) daily for 10 days.   Then  take 50 mg (2 tablets) daily for 14 days.   Then take 100 mg (4 tablets) daily for 7 days.   Then take 100 mg (4 tablets) in the morning and 25 mg (1 tablet) at night for 7 days.   Then take 100 mg (4 tablets) in the morning and 50 mg (2 tablets) at night for 7 days.   Then take 100 mg (4 tablets) twice a day.  Continue this dose.  Follow with your PCP for refills. 04/18/24   Perri DELENA Meliton Mickey., MD  LANTUS  SOLOSTAR 100 UNIT/ML Solostar Pen Inject 5 Units into the skin daily. 04/18/24   Perri DELENA Meliton Mickey., MD  levETIRAcetam  (KEPPRA ) 250 MG tablet Take 2 tablets (500 mg total) by mouth every morning AND 1 tablet (250 mg total) every Tuesday, Thursday, and Saturday at 6 PM. (After dialysis). 04/18/24 05/30/24  Perri DELENA Meliton Mickey., MD  NOVOLOG  FLEXPEN 100 UNIT/ML FlexPen Before each meal 3 times a day, 140-199 - 2 units, 200-250 - 4 units, 251-299 - 6 units,  300-349 - 7 units,  350 or above 8 units. 03/30/24   Singh, Prashant K, MD  OLANZapine  (ZYPREXA ) 5 MG tablet Take 5 mg by mouth at bedtime.    [provider]  Pancrelipase , Lip-Prot-Amyl, 3000-9500 units CPEP Take 3,000 Units by mouth in the morning, at noon, and at bedtime.    [provider]  pantoprazole  (PROTONIX ) 40 MG tablet Take 1 tablet (40 mg  total) by mouth 2 (two) times daily. 02/07/24 04/10/24  Drusilla Sabas RAMAN, MD  prochlorperazine  (COMPAZINE ) 5 MG tablet Take 5 mg by mouth every 6 (six) hours as needed for nausea or vomiting.    [provider]  [Paused] rosuvastatin  (CRESTOR ) 40 MG tablet Take 40 mg by mouth daily. Wait to take this until your doctor or other care provider tells you to start again. 01/20/24 01/19/25  [provider]  sacubitril -valsartan  (ENTRESTO ) 24-26 MG Take 1 tablet by mouth 2 (two) times daily. Follow with your PCP/outpatient provider for refills 04/18/24 05/18/24  Perri DELENA Meliton Mickey., MD  sevelamer  carbonate (RENVELA ) 800 MG tablet Take 1 tablet (800 mg total) by mouth  3 (three) times daily with meals. 02/07/24   Drusilla Sabas RAMAN, MD  sodium bicarbonate  650 MG tablet Take 650 mg by mouth 2 (two) times daily. Patient not taking: Reported on 04/07/2024 07/29/23   [provider]  SUMAtriptan  (IMITREX ) 50 MG tablet Take 50 mg by mouth every 2 (two) hours as needed for migraine or headache. 06/20/22   [provider]  VELTASSA  16.8 g PACK Take 1 Dose by mouth daily at 12 noon. 04/03/24   [provider]    Allergies: Keflex  [cephalexin ], Penicillins, Vibramycin  [doxycycline ], Benadryl  [diphenhydramine ], Dilaudid  [hydromorphone ], Methotrexate and trimetrexate, and Roxicodone  [oxycodone ]    Review of Systems  Updated Vital Signs BP (!) 152/77   Pulse 87   Resp 14   SpO2 99%   Physical Exam Vitals and nursing note reviewed.  Constitutional:      Appearance: She is obese.  HENT:     Nose: Nose normal.     Mouth/Throat:     Mouth: Mucous membranes are moist.  Eyes:     Extraocular Movements: Extraocular movements intact.     Pupils: Pupils are equal, round, and reactive to light.  Cardiovascular:     Rate and Rhythm: Normal rate and regular rhythm.  Pulmonary:     Effort: Pulmonary effort is normal.     Breath sounds: Normal breath sounds.  Abdominal:     General: Abdomen is flat. There is no distension.     Tenderness: There is no abdominal tenderness. There is no guarding or rebound.  Musculoskeletal:     Right lower leg: No edema.     Left lower leg: No edema.  Skin:    General: Skin is warm.  Neurological:     General: No focal deficit present.     Mental Status: She is alert and oriented to person, place, and time.  Psychiatric:        Mood and Affect: Mood normal.        Behavior: Behavior normal.     (all labs ordered are listed, but only abnormal results are displayed) Labs Reviewed  CBC WITH DIFFERENTIAL/PLATELET - Abnormal; Notable for the following components:      Result Value   Hemoglobin 10.5 (*)     MCHC 26.6 (*)    RDW 15.6 (*)    Abs Immature Granulocytes 0.09 (*)    All other components within normal limits  COMPREHENSIVE METABOLIC PANEL WITH GFR - Abnormal; Notable for the following components:   Sodium 113 (*)    Potassium >7.5 (*)    Chloride 79 (*)    CO2 <7 (*)    Glucose, Bld >1,200 (*)    BUN 88 (*)    Creatinine, Ser 6.79 (*)    Calcium  8.8 (*)    Total Protein  5.8 (*)    AST 59 (*)    ALT 133 (*)    Alkaline Phosphatase 722 (*)    GFR, Estimated 8 (*)    All other components within normal limits  CBG MONITORING, ED - Abnormal; Notable for the following components:   Glucose-Capillary >600 (*)    All other components within normal limits  I-STAT VENOUS BLOOD GAS, ED - Abnormal; Notable for the following components:   pH, Ven 7.185 (*)    pCO2, Ven 16.5 (*)    pO2, Ven 110 (*)    Bicarbonate 6.2 (*)    TCO2 7 (*)    Acid-base deficit 20.0 (*)    Sodium 113 (*)    Potassium 7.1 (*)    Calcium , Ion 1.00 (*)    All other components within normal limits  I-STAT CHEM 8, ED - Abnormal; Notable for the following components:   Sodium 113 (*)    Potassium 7.6 (*)    Chloride 92 (*)    BUN 106 (*)    Creatinine, Ser 7.00 (*)    Glucose, Bld >700 (*)    Calcium , Ion 0.98 (*)    TCO2 10 (*)    All other components within normal limits  I-STAT CHEM 8, ED - Abnormal; Notable for the following components:   Sodium 114 (*)    Potassium 7.1 (*)    Chloride 93 (*)    BUN 89 (*)    Creatinine, Ser 7.00 (*)    Glucose, Bld >700 (*)    Calcium , Ion 1.01 (*)    TCO2 8 (*)    All other components within normal limits  RESP PANEL BY RT-PCR (RSV, FLU A&B, COVID)  RVPGX2  HCG, SERUM, QUALITATIVE  BETA-HYDROXYBUTYRIC ACID  URINALYSIS, ROUTINE W REFLEX MICROSCOPIC  HCG, SERUM, QUALITATIVE  BASIC METABOLIC PANEL WITH GFR  BASIC METABOLIC PANEL WITH GFR  BETA-HYDROXYBUTYRIC ACID    EKG: EKG Interpretation Date/Time:  Thursday April 23 2024 09:22:12  EST Ventricular Rate:  85 PR Interval:  161 QRS Duration:  98 QT Interval:  417 QTC Calculation: 496 R Axis:   75  Text Interpretation: Sinus rhythm Borderline repolarization abnormality Prolonged QT interval Confirmed by Neysa Clap [phone]) on 04/23/2024 10:44:56 AM  Radiology: [facility] Chest 2 View Result Date: 04/23/2024 CLINICAL DATA:  Recent pneumonia.  Fatigue. EXAM: CHEST - 2 VIEW COMPARISON:  04/13/2024 FINDINGS: Low volume film. The cardio pericardial silhouette is enlarged. Diffuse right-sided and retrocardiac left base airspace disease seen previously has decreased in the interval. No dense focal consolidative airspace disease on the current study. No substantial pleural effusion. No acute bony abnormality. Telemetry leads overlie the chest. IMPRESSION: Interval decrease in diffuse right-sided and retrocardiac left base airspace disease. No dense focal consolidative airspace disease on the current study. Electronically Signed   By: Camellia Candle M.D.   On: 04/23/2024 10:12     .Critical Care  Performed by: Neysa Clap PARAS, DO Authorized by: Neysa Clap PARAS, DO   Critical care provider statement:    Critical care time (minutes):  30   Critical care was necessary to treat or prevent imminent or life-threatening deterioration of the following conditions:  Endocrine crisis   Critical care was time spent personally by me on the following activities:  Development of treatment plan with patient or surrogate, discussions with consultants, evaluation of patient's response to treatment, examination of patient, ordering and review of laboratory studies, ordering and review of radiographic studies, ordering and performing treatments  and interventions, pulse oximetry, re-evaluation of patient's condition and review of old charts   Care discussed with: admitting provider      Medications Ordered in the ED  insulin  regular, human (MYXREDLIN ) 100 units/ 100 mL infusion (has no administration in  time range)  lactated ringers  infusion (has no administration in time range)  dextrose  5 % in lactated ringers  infusion (has no administration in time range)  dextrose  50 % solution 0-50 mL (has no administration in time range)  insulin  aspart (novoLOG ) injection 10 Units (10 Units Subcutaneous Given 04/23/24 0957)  lactated ringers  bolus 500 mL (500 mLs Intravenous New Bag/Given 04/23/24 1005)    Clinical Course as of 04/23/24 1048  Thu Apr 23, 2024  0918 Glucose-Capillary(!!): >600 Will give fluids and insulin .  [TY]  1000 I-stat chem 8, ED (not at [facility], [facility] or [facility])(!!) Glucose is quite high. [TY]  1001 Potassium(!!): 7.6 EKG without evidence of hyperkalemia.  Also starting insulin  [TY]  1028 DG Chest 2 View IMPRESSION: Interval decrease in diffuse right-sided and retrocardiac left base airspace disease. No dense focal consolidative airspace disease on the current study.   [TY]  1030 pH, Ven(!!): 7.185 [TY]  1046 Case discussed with Dr. Claudene, hospitalist, for admission for further treatment of DKA.  [TY]    Clinical Course User Index [TY] Neysa Caron PARAS, DO                                 Medical Decision Making This is a 33 year old female complex past medical history to include type 1 diabetes, ESRD on dialysis last went to dialysis yesterday, bipolar, documented heart failure reduced EF presenting emergency department complaining of generalized weakness.  EMS reported blood sugar of 575.  Refused all other treatments interventions with EMS.  She has a poor historian and not forthcoming with information and states that she just hurts all over.  Her initial blood sugar here greater than 600.  Concern for DKA causing her symptoms.  Per chart review admitted recently for same was initially treated for pneumonia on admission, but antibiotics discontinued as it was thought her symptoms were secondary to fluid overload rather than infectious process.  Will get screening labs, IV  fluids insulin , chest x-ray and EKG.  Plan to reassess.  High probability of admission.  See ED course for final MDM and disposition.  Amount and/or Complexity of Data Reviewed External Data Reviewed:     Details: CT chest on 12/19 with IMPRESSION: 1. Mild cardiomegaly and mild coronary artery calcification. 2. Small to moderate pulmonary edema. Small bilateral pleural effusions. Mild ascites and diffuse subcutaneous body wall edema, consistent with anasarca and/or cardiogenic failure.  Labs: ordered. Decision-making details documented in ED Course. Radiology: ordered and independent interpretation performed. Decision-making details documented in ED Course. ECG/medicine tests: ordered and independent interpretation performed.  Risk Prescription drug management. Decision regarding hospitalization. Diagnosis or treatment significantly limited by social determinants of health. Risk Details: Poor health literacy.  Question medication noncompliance.      Final diagnoses:  Diabetic ketoacidosis without coma associated with type 1 diabetes mellitus [facility])    ED Discharge Orders     None          Neysa Caron PARAS, DO 04/23/24 1048  "

## 2024-04-23 NOTE — ED Notes (Signed)
 Pt transported to xray

## 2024-04-23 NOTE — ED Triage Notes (Signed)
 Pt had pneumonia last week, was discharged but has been lethargic for past 2 days, not keeping up with diabetes care. CBG with EMS 575. AOX4. Refused all treatment with EMS.

## 2024-04-23 NOTE — Consult Note (Signed)
 NEUROLOGY CONSULT NOTE   Date of service: April 23, 2024 Patient Name: Ashley Freeman MRN:  [id number] DOB:  [birth year] Reason for Consultation: AED management Requesting Provider: Kassie Acquanetta Bradley, MD  History of Present Illness  Ashley Freeman is a 32 y.o. female with a PMHx of poorly controlled DM Type 1, seizures, Bipolar Disorder, ESRD on HD, CHFrEF, HTN, HLD and poor medication compliance, recently discharged from the hospital on 12/20 after admission for a witnessed breakthrough seizure in the setting of an extremely elevated blood glucose level of 1155, who re-presents with generalized pain. She experiences generalized pain described as 'everywhere.' She also complains of weakness and malaise for the last 3 days. Her last dialysis session is unclear, but was previously reported as yesterday. Patient states that she has been taking her insulin  as prescribed.  In the ED, the patient's vital signs were stable. Labs revealed low sodium of 113 in the setting of glucose > 1200 (component of pseudohyponatremia), potassium >7.5, CO2 less than 7, BUN 88, alkaline phosphatase 722, AST 59, and ALT 133.SABRAShe has been started on an insulin  drip.   During her last hospital stay, the patient was seen by Neurology with recommendation to titrate Lamictal  back up and then taper off Keppra .  Unclear why this change was made.  While patient was in the emergency department, she had an episode of emesis with aspiration and was intubated for airway protection.  She reportedly had continuous facial twitching during intubation which ceased after she was placed on propofol .  Chart review shows several prior admissions for both hyperglycemia as well as hypoglycemia. Some of these have triggered seizures. She was therefore started on lamotrigine . Per Dr. Gill assessment last admission, the patient has been poorly compliant with her AED regimen: No recent fills of Lamictal  on her  chart. Dr. Perri noted that he reviewed patient's prescriptions with [facility] and it hasn't been filled since the summer. Psych was consulted for med recs due to possible medication non-compliance with Lamictal . While speaking with patient, she confirmed that she hasn't had her medicine for at least 2- 3 months because she did not have refills and forgot to ask the doctor for them. She denies any specific side effects or intolerance to lamotrigine .   ROS   Unable to ascertain due to patient being intubated and sedated  Past History   Past Medical History:  Diagnosis Date   Abscess, gluteal, right 08/24/2013   Anemia 02/19/2012   Bartholin's gland abscess 09/19/2013   Bipolar disorder (HCC)    BV (bacterial vaginosis) 11/24/2015   Depression    Diabetes mellitus type I (HCC) 2001   Diagnosed at age 4 ; Type I   Diarrhea 05/30/2016   DKA (diabetic ketoacidoses) 08/19/2013   Also in 2018   ESRD (end stage renal disease) (HCC)    Gonorrhea 08/2011   Treated in 09/2011   HFrEF (heart failure with reduced ejection fraction) (HCC)    a. 2022 Echo: EF 40%; b. 10/2021 Echo: EF 55%; b. 07/2022 MV: No ischemia. EF 31%; c. 08/2022 Echo: EF 35%, mildly dil RV, sev TR.   History of trichomoniasis 05/31/2016   Hyperlipidemia 03/28/2016   Hypertension    NICM (nonischemic cardiomyopathy) (HCC)    Sepsis (HCC) 09/19/2013    Past Surgical History:  Procedure Laterality Date   A/V FISTULAGRAM Right 06/17/2023   Procedure: A/V Fistulagram;  Surgeon: Marea Selinda GORMAN, MD;  Location: [facility] INVASIVE CV LAB;  Service: Cardiovascular;  Laterality: Right;  A/V SHUNT INTERVENTION N/A 09/25/2023   Procedure: A/V SHUNT INTERVENTION;  Surgeon: Pearline Norman RAMAN, MD;  Location: [facility] PV LAB;  Service: Cardiovascular;  Laterality: N/A;   AV FISTULA PLACEMENT Right 07/06/2022   Procedure: ARTERIOVENOUS GRAFT CREATION;  Surgeon: Gretta Lonni PARAS, MD;  Location: [facility] OR;  Service: Vascular;  Laterality: Right;    CESAREAN SECTION N/A 10/05/2019   Procedure: CESAREAN SECTION;  Surgeon: Izell Harari, MD;  Location: [facility] LD ORS;  Service: Obstetrics;  Laterality: N/A;   CHOLECYSTECTOMY N/A 07/02/2023   Procedure: LAPAROSCOPIC CHOLECYSTECTOMY;  Surgeon: Ebbie Cough, MD;  Location: [facility] OR;  Service: General;  Laterality: N/A;   ESOPHAGOGASTRODUODENOSCOPY N/A 01/31/2024   Procedure: EGD (ESOPHAGOGASTRODUODENOSCOPY);  Surgeon: San Sandor GAILS, DO;  Location: [facility] ENDOSCOPY;  Service: Gastroenterology;  Laterality: N/A;   INCISION AND DRAINAGE ABSCESS Left 09/28/2019   Procedure: INCISION AND DRAINAGE VULVAR ABCESS;  Surgeon: Edsel Norleen GAILS, MD;  Location: [facility] OR;  Service: Gynecology;  Laterality: Left;   INCISION AND DRAINAGE ABSCESS Right 02/23/2024   Procedure: INCISION AND DRAINAGE, ABSCESS;  Surgeon: Tobie Eldora NOVAK, MD;  Location: [facility] OR;  Service: ENT;  Laterality: Right;   INCISION AND DRAINAGE PERIRECTAL ABSCESS Right 08/18/2013   Procedure: IRRIGATION AND DEBRIDEMENT GLUTEAL ABSCESS;  Surgeon: Lynda Leos, MD;  Location: [facility] OR;  Service: General;  Laterality: Right;   INCISION AND DRAINAGE PERIRECTAL ABSCESS Right 09/19/2013   Procedure: IRRIGATION AND DEBRIDEMENT RIGHT GLUTEAL AND LABIAL ABSCESSES;  Surgeon: Lynda Leos, MD;  Location: [facility] OR;  Service: General;  Laterality: Right;   INCISION AND DRAINAGE PERIRECTAL ABSCESS Right 09/24/2013   Procedure: IRRIGATION AND DEBRIDEMENT PERIRECTAL ABSCESS;  Surgeon: Lynwood MALVA Pina, MD;  Location: [facility] OR;  Service: General;  Laterality: Right;   IR PARACENTESIS  08/28/2023   IR PARACENTESIS  11/04/2023   IR PARACENTESIS  12/23/2023   IR PARACENTESIS  02/04/2024   IR PARACENTESIS  03/23/2024   IR PARACENTESIS  04/06/2024    Family History: Family History  Problem Relation Age of Onset   Asthma Mother    Carpal tunnel syndrome Mother    Gout Father    Diabetes Paternal Grandmother    Anesthesia problems Neg Hx     Social History  reports  that she has never smoked. She has never been exposed to tobacco smoke. She has never used smokeless tobacco. She reports that she does not currently use alcohol . She reports that she does not use drugs.  Allergies[1]  Medications  Current Medications[2]  Vitals   Vitals:   04/23/24 0915 04/23/24 1257 04/23/24 1400 04/23/24 1431  BP: (!) 152/77 (!) 160/72 (!) 170/93   Pulse: 87 88 87   Resp: 14 (!) 21 (!) 22   Temp:    (!) 97.5 F (36.4 C)  TempSrc:    Axillary  SpO2: 99% 99% 99%     There is no height or weight on file to calculate BMI.   Physical Exam   Constitutional: Intubated, chronically ill appearing patient  Eyes: No scleral injection.  HENT: Endotracheal tube in place Head: Normocephalic.  Cardiovascular: Normal rate and regular rhythm.  Respiratory: Respirations synchronous with ventilator Skin: WDI.   Neurologic Examination   (On sedation with low-dose propofol ) patient does not respond to name or follow commands.  She will grimace to sternal rub but does not move extremities or localize.  Pupils are equal round and reactive to light. Oculocephalic reflex is absent.  No spontaneous movement of extremities and no response to  proximal pinch.  Labs/Imaging/Neurodiagnostic studies   CBC:  Recent Labs  Lab 05-07-2024 0830 04/23/24 0910 04/23/24 0953 04/23/24 1015 04/23/24 1016  WBC 7.3 10.4  --   --   --   NEUTROABS 3.0 7.5  --   --   --   HGB 9.6* 10.5*   < > 12.9 12.6  HCT 31.5* 39.5   < > 38.0 37.0  MCV 87.7 100.0  --   --   --   PLT 165 222  --   --   --    < > = values in this interval not displayed.   Basic Metabolic Panel:  Lab Results  Component Value Date   NA 113 (LL) 04/23/2024   K 7.1 (HH) 04/23/2024   CO2 <7 (L) 04/23/2024   GLUCOSE >700 (HH) 04/23/2024   BUN 89 (H) 04/23/2024   CREATININE 7.00 (H) 04/23/2024   CALCIUM  8.8 (L) 04/23/2024   GFRNONAA 8 (L) 04/23/2024   GFRAA 44 (L) 01/30/2020   Lipid Panel:  Lab Results  Component  Value Date   LDLCALC 149 (H) 05/24/2021   HgbA1c:  Lab Results  Component Value Date   HGBA1C 8.5 (H) 03/30/2024   Urine Drug Screen:     Component Value Date/Time   LABOPIA NONE DETECTED 09/07/2022 1840   LABOPIA NONE DETECTED 04/24/2021 1032   COCAINSCRNUR NONE DETECTED 09/07/2022 1840   COCAINSCRNUR NEGATIVE 12/18/2009 1453   LABBENZ NONE DETECTED 09/07/2022 1840   LABBENZ POSITIVE (A) 04/24/2021 1032   LABBENZ NEGATIVE 12/18/2009 1453   AMPHETMU NONE DETECTED 09/07/2022 1840   AMPHETMU NONE DETECTED 04/24/2021 1032   THCU NONE DETECTED 09/07/2022 1840   THCU NONE DETECTED 04/24/2021 1032   LABBARB NONE DETECTED 09/07/2022 1840   LABBARB NONE DETECTED 04/24/2021 1032    Alcohol  Level     Component Value Date/Time   ETH <15 03/22/2024 0331   INR  Lab Results  Component Value Date   INR 1.5 (H) 04/06/2024   APTT  Lab Results  Component Value Date   APTT 26 04/24/2021   AED levels:  Lab Results  Component Value Date   LAMOTRIGINE  <1.0 (L) 02/20/2024      ASSESSMENT   Ashley Freeman is a 33 y.o. female with a PMHx of poorly controlled DM Type 1, seizures, Bipolar Disorder, ESRD on HD, CHFrEF, HTN, HLD and poor medication compliance who originally presented with generalized pain.  She was noted to have an extremely high blood glucose and was found to be in DKA.  She then had some seizure activity in the emergency department, vomited, aspirated and was intubated for airway protection.  She has had 2 recent hospitalizations this month for seizure and DKA.  Patient was in the process of titrating up Lamictal  to a therapeutic dose and was then supposed to stop Keppra . He Lamictal  level is < 1.0, which is consistent with non-compliance. It is unclear why this medication change was being made and patient unfortunately cannot give history right now regarding possible side effects from Keppra .  Given compliance issues, would be concerned about patient stopping  therapeutic Lamictal  and then abruptly starting it again, leading to increased risk of Stevens-Johnson syndrome. Therefore, we will initiate Vimpat  for seizure control.  Given seizure activity in the emergency department, will place patient on LTM EEG and maintain this until patient is extubated and back to her mental baseline.   RECOMMENDATIONS  - LTM EEG - Start Vimpat  100 mg twice daily - Discontinue  Keppra  and Lamictal  - Neurology will continue to follow  Addendum:  - Reexamined with propofol  at a rate of 50. Flails upper extremities intermittently. Not responding to voice. Inconsistent responses to tactile stimuli.  - LTM EEG shows diffuse low voltage in all 4 leads in the alpha frequency range. No electrographic seizures seen.  ______________________________________________________________________  Patient seen by NP and by MD. Earle FORBES Everitt Clint Abbey , MSN, AGACNP-BC [facility] See Amion for schedule and pager information 04/23/2024 4:31 PM    Signed, MERRIANNE, Vincen Bejar, MD [facility]     [1]  Allergies Allergen Reactions   Keflex  [Cephalexin ] Anaphylaxis    Ceftriaxone  in the past with no reaction   Penicillins Anaphylaxis, Hives and Rash   Vibramycin  [Doxycycline ] Anaphylaxis   Benadryl  [Diphenhydramine ] Itching   Dilaudid  [Hydromorphone ] Itching   Methotrexate And Trimetrexate Rash   Roxicodone  [Oxycodone ] Itching    Takes Percocet without issue  [2]  Current Facility-Administered Medications:    acetaminophen  (TYLENOL ) tablet 650 mg, 650 mg, Oral, Q6H PRN **OR** acetaminophen  (TYLENOL ) suppository 650 mg, 650 mg, Rectal, Q6H PRN, Smith, Rondell A, MD   albuterol  (PROVENTIL ) (2.5 MG/3ML) 0.083% nebulizer solution 2.5 mg, 2.5 mg, Nebulization, Q6H PRN, Claudene, Rondell A, MD   Chlorhexidine  Gluconate Cloth 2 % PADS 6 each, 6 each, Topical, Q0600, Bhandari, Dron Prasad, MD   dextrose  5 % in lactated ringers  infusion, , Intravenous, Continuous,  Claudene Reeves A, MD, Last Rate: 75 mL/hr at 04/23/24 1126, New Bag at 04/23/24 1126   dextrose  50 % solution 0-50 mL, 0-50 mL, Intravenous, PRN, Young, Travis J, DO   heparin  injection 5,000 Units, 5,000 Units, Subcutaneous, Q8H, Smith, Rondell A, MD   insulin  regular, human (MYXREDLIN ) 100 units/ 100 mL infusion, , Intravenous, Continuous, Young, Travis J, DO, Last Rate: 4.8 mL/hr at 04/23/24 1054, 4.8 Units/hr at 04/23/24 1054   lactated ringers  infusion, , Intravenous, Continuous, Claudene Reeves A, MD, Last Rate: 75 mL/hr at 04/23/24 1356, Rate Change at 04/23/24 1356   lamoTRIgine  (LAMICTAL ) tablet 25 mg, 25 mg, Oral, See admin instructions, Smith, Rondell A, MD   levETIRAcetam  (KEPPRA ) tablet 500 mg, 500 mg, Oral, Daily **AND** [START ON 04/25/2024] levETIRAcetam  (KEPPRA ) tablet 250 mg, 250 mg, Oral, Q T,Th,Sat-1800, Smith, Rondell A, MD   LORazepam  (ATIVAN ) injection 1-2 mg, 1-2 mg, Intravenous, Q2H PRN, Claudene, Rondell A, MD, 2 mg at 04/23/24 1326   sodium chloride  flush (NS) 0.9 % injection 3 mL, 3 mL, Intravenous, Q12H, Smith, Rondell A, MD, 3 mL at 04/23/24 1126   sodium zirconium cyclosilicate  (LOKELMA ) packet 10 g, 10 g, Oral, Once, Smith, Rondell A, MD   trimethobenzamide  (TIGAN ) injection 200 mg, 200 mg, Intramuscular, Q6H PRN, Claudene Reeves LABOR, MD  Current Outpatient Medications:    albuterol  (VENTOLIN  HFA) 108 (90 Base) MCG/ACT inhaler, Inhale 2 puffs into the lungs every 4 (four) hours as needed for wheezing or shortness of breath., Disp: , Rfl:    bumetanide  (BUMEX ) 2 MG tablet, Take 10 mg by mouth daily., Disp: , Rfl:    carvedilol  (COREG ) 25 MG tablet, Take 1 tablet (25 mg total) by mouth 2 (two) times daily with a meal. Follow with your PCP for refills., Disp: 60 tablet, Rfl: 0   Cinnamon 500 MG capsule, Take 500 mg by mouth in the morning., Disp: , Rfl:    DULoxetine  (CYMBALTA ) 20 MG capsule, Take 20 mg by mouth daily., Disp: , Rfl:    Elderberry-Vitamin C-Zinc  (ELDERBERRY  IMMUNE HEALTH GUMMY PO), Take  2 each by mouth in the morning., Disp: , Rfl:    fluticasone  (FLONASE ) 50 MCG/ACT nasal spray, Place 2 sprays into both nostrils daily as needed for allergies or rhinitis., Disp: , Rfl:    hydrOXYzine  (ATARAX ) 25 MG tablet, Take 25 mg by mouth 3 (three) times daily as needed for anxiety, itching, nausea or vomiting., Disp: , Rfl:    lamoTRIgine  (LAMICTAL ) 25 MG tablet, Take 25 mg (1 tablet) daily for 10 days.   Then take 50 mg (2 tablets) daily for 14 days.   Then take 100 mg (4 tablets) daily for 7 days.   Then take 100 mg (4 tablets) in the morning and 25 mg (1 tablet) at night for 7 days.   Then take 100 mg (4 tablets) in the morning and 50 mg (2 tablets) at night for 7 days.   Then take 100 mg (4 tablets) twice a day.  Continue this dose.  Follow with your PCP for refills., Disp: 199 tablet, Rfl: 0   LANTUS  SOLOSTAR 100 UNIT/ML Solostar Pen, Inject 5 Units into the skin daily., Disp: , Rfl:    levETIRAcetam  (KEPPRA ) 250 MG tablet, Take 2 tablets (500 mg total) by mouth every morning AND 1 tablet (250 mg total) every Tuesday, Thursday, and Saturday at 6 PM. (After dialysis)., Disp: 102 tablet, Rfl: 0   NOVOLOG  FLEXPEN 100 UNIT/ML FlexPen, Before each meal 3 times a day, 140-199 - 2 units, 200-250 - 4 units, 251-299 - 6 units,  300-349 - 7 units,  350 or above 8 units., Disp: , Rfl:    OLANZapine  (ZYPREXA ) 5 MG tablet, Take 5 mg by mouth at bedtime., Disp: , Rfl:    Pancrelipase , Lip-Prot-Amyl, 3000-9500 units CPEP, Take 3,000 Units by mouth in the morning, at noon, and at bedtime., Disp: , Rfl:    pantoprazole  (PROTONIX ) 40 MG tablet, Take 1 tablet (40 mg total) by mouth 2 (two) times daily., Disp: 120 tablet, Rfl: 0   prochlorperazine  (COMPAZINE ) 5 MG tablet, Take 5 mg by mouth every 6 (six) hours as needed for nausea or vomiting., Disp: , Rfl:    [Paused] rosuvastatin  (CRESTOR ) 40 MG tablet, Take 40 mg by mouth daily., Disp: , Rfl:    sacubitril -valsartan  (ENTRESTO )  24-26 MG, Take 1 tablet by mouth 2 (two) times daily. Follow with your PCP/outpatient provider for refills, Disp: 60 tablet, Rfl: 0   sevelamer  carbonate (RENVELA ) 800 MG tablet, Take 1 tablet (800 mg total) by mouth 3 (three) times daily with meals., Disp: 90 tablet, Rfl: 2   sodium bicarbonate  650 MG tablet, Take 650 mg by mouth 2 (two) times daily. (Patient not taking: Reported on 04/07/2024), Disp: , Rfl:    SUMAtriptan  (IMITREX ) 50 MG tablet, Take 50 mg by mouth every 2 (two) hours as needed for migraine or headache., Disp: , Rfl:    VELTASSA  16.8 g PACK, Take 1 Dose by mouth daily at 12 noon., Disp: , Rfl:

## 2024-04-24 ENCOUNTER — Inpatient Hospital Stay (HOSPITAL_COMMUNITY)

## 2024-04-24 DIAGNOSIS — G40909 Epilepsy, unspecified, not intractable, without status epilepticus: Secondary | ICD-10-CM | POA: Diagnosis not present

## 2024-04-24 DIAGNOSIS — E1010 Type 1 diabetes mellitus with ketoacidosis without coma: Secondary | ICD-10-CM | POA: Diagnosis not present

## 2024-04-24 DIAGNOSIS — G9340 Encephalopathy, unspecified: Secondary | ICD-10-CM

## 2024-04-24 DIAGNOSIS — Z992 Dependence on renal dialysis: Secondary | ICD-10-CM

## 2024-04-24 DIAGNOSIS — I5022 Chronic systolic (congestive) heart failure: Secondary | ICD-10-CM

## 2024-04-24 DIAGNOSIS — I132 Hypertensive heart and chronic kidney disease with heart failure and with stage 5 chronic kidney disease, or end stage renal disease: Secondary | ICD-10-CM | POA: Diagnosis not present

## 2024-04-24 DIAGNOSIS — I959 Hypotension, unspecified: Secondary | ICD-10-CM

## 2024-04-24 DIAGNOSIS — N189 Chronic kidney disease, unspecified: Secondary | ICD-10-CM | POA: Diagnosis not present

## 2024-04-24 DIAGNOSIS — I5042 Chronic combined systolic (congestive) and diastolic (congestive) heart failure: Secondary | ICD-10-CM

## 2024-04-24 DIAGNOSIS — E1022 Type 1 diabetes mellitus with diabetic chronic kidney disease: Secondary | ICD-10-CM | POA: Diagnosis not present

## 2024-04-24 LAB — GLUCOSE, CAPILLARY
Glucose-Capillary: 104 mg/dL — ABNORMAL HIGH (ref 70–99)
Glucose-Capillary: 119 mg/dL — ABNORMAL HIGH (ref 70–99)
Glucose-Capillary: 128 mg/dL — ABNORMAL HIGH (ref 70–99)
Glucose-Capillary: 139 mg/dL — ABNORMAL HIGH (ref 70–99)
Glucose-Capillary: 151 mg/dL — ABNORMAL HIGH (ref 70–99)
Glucose-Capillary: 164 mg/dL — ABNORMAL HIGH (ref 70–99)
Glucose-Capillary: 166 mg/dL — ABNORMAL HIGH (ref 70–99)
Glucose-Capillary: 184 mg/dL — ABNORMAL HIGH (ref 70–99)
Glucose-Capillary: 184 mg/dL — ABNORMAL HIGH (ref 70–99)
Glucose-Capillary: 205 mg/dL — ABNORMAL HIGH (ref 70–99)
Glucose-Capillary: 64 mg/dL — ABNORMAL LOW (ref 70–99)
Glucose-Capillary: 90 mg/dL (ref 70–99)
Glucose-Capillary: 90 mg/dL (ref 70–99)

## 2024-04-24 LAB — CBC
HCT: 36.7 % (ref 36.0–46.0)
Hemoglobin: 12.6 g/dL (ref 12.0–15.0)
MCH: 27 pg (ref 26.0–34.0)
MCHC: 34.3 g/dL (ref 30.0–36.0)
MCV: 78.6 fL — ABNORMAL LOW (ref 80.0–100.0)
Platelets: 222 K/uL (ref 150–400)
RBC: 4.67 MIL/uL (ref 3.87–5.11)
RDW: 13.9 % (ref 11.5–15.5)
WBC: 19.5 K/uL — ABNORMAL HIGH (ref 4.0–10.5)
nRBC: 0.1 % (ref 0.0–0.2)

## 2024-04-24 LAB — BASIC METABOLIC PANEL WITH GFR
Anion gap: 14 (ref 5–15)
BUN: 39 mg/dL — ABNORMAL HIGH (ref 6–20)
CO2: 21 mmol/L — ABNORMAL LOW (ref 22–32)
Calcium: 8.2 mg/dL — ABNORMAL LOW (ref 8.9–10.3)
Chloride: 90 mmol/L — ABNORMAL LOW (ref 98–111)
Creatinine, Ser: 3.87 mg/dL — ABNORMAL HIGH (ref 0.44–1.00)
GFR, Estimated: 15 mL/min — ABNORMAL LOW
Glucose, Bld: 117 mg/dL — ABNORMAL HIGH (ref 70–99)
Potassium: 4.5 mmol/L (ref 3.5–5.1)
Sodium: 125 mmol/L — ABNORMAL LOW (ref 135–145)

## 2024-04-24 LAB — BETA-HYDROXYBUTYRIC ACID: Beta-Hydroxybutyric Acid: 0.06 mmol/L (ref 0.05–0.27)

## 2024-04-24 LAB — LACTIC ACID, PLASMA: Lactic Acid, Venous: 2.2 mmol/L (ref 0.5–1.9)

## 2024-04-24 LAB — TRIGLYCERIDES: Triglycerides: 157 mg/dL — ABNORMAL HIGH

## 2024-04-24 MED ORDER — NOREPINEPHRINE 4 MG/250ML-% IV SOLN
0.0000 ug/min | INTRAVENOUS | Status: DC
  Administered 2024-04-24: 2 ug/min via INTRAVENOUS
  Filled 2024-04-24: qty 250

## 2024-04-24 MED ORDER — LACTATED RINGERS IV BOLUS: 1000.0000 mL | Freq: Once | INTRAVENOUS | Status: DC

## 2024-04-24 MED ORDER — LACTATED RINGERS IV SOLN: INTRAVENOUS | Status: DC

## 2024-04-24 MED ORDER — INSULIN ASPART 100 UNIT/ML IJ SOLN
0.0000 [IU] | INTRAMUSCULAR | Status: DC
  Administered 2024-04-24: 1 [IU] via SUBCUTANEOUS
  Administered 2024-04-24 – 2024-04-25 (×2): 2 [IU] via SUBCUTANEOUS
  Filled 2024-04-24 (×2): qty 2
  Filled 2024-04-24: qty 1

## 2024-04-24 MED ORDER — DEXMEDETOMIDINE HCL IN NACL 400 MCG/100ML IV SOLN
0.0000 ug/kg/h | INTRAVENOUS | Status: DC
  Administered 2024-04-24: 0.6 ug/kg/h via INTRAVENOUS
  Administered 2024-04-24: 1.2 ug/kg/h via INTRAVENOUS
  Administered 2024-04-25: 0.7 ug/kg/h via INTRAVENOUS
  Filled 2024-04-24 (×4): qty 100

## 2024-04-24 MED ORDER — INSULIN GLARGINE-YFGN 100 UNIT/ML ~~LOC~~ SOLN: 5.0000 [IU] | Freq: Every day | SUBCUTANEOUS | Status: DC

## 2024-04-24 MED ORDER — SODIUM CHLORIDE 0.9 % IV SOLN: 250.0000 mL | INTRAVENOUS | Status: AC

## 2024-04-24 MED ORDER — INSULIN GLARGINE 100 UNIT/ML ~~LOC~~ SOLN
5.0000 [IU] | Freq: Every day | SUBCUTANEOUS | Status: DC
  Administered 2024-04-24 – 2024-04-27 (×4): 5 [IU] via SUBCUTANEOUS
  Filled 2024-04-24 (×4): qty 0.05

## 2024-04-24 MED ORDER — CHLORHEXIDINE GLUCONATE CLOTH 2 % EX PADS
6.0000 | MEDICATED_PAD | Freq: Every day | CUTANEOUS | Status: DC
  Administered 2024-04-25 – 2024-04-28 (×4): 6 via TOPICAL

## 2024-04-24 MED ORDER — FENTANYL CITRATE (PF) 50 MCG/ML IJ SOSY
50.0000 ug | PREFILLED_SYRINGE | Freq: Once | INTRAMUSCULAR | Status: AC
  Administered 2024-04-24: 50 ug via INTRAVENOUS

## 2024-04-24 MED ORDER — FENTANYL 2500MCG IN NS 250ML (10MCG/ML) PREMIX INFUSION
0.0000 ug/h | INTRAVENOUS | Status: DC
  Administered 2024-04-24 – 2024-04-25 (×2): 100 ug/h via INTRAVENOUS
  Administered 2024-04-26: 150 ug/h via INTRAVENOUS
  Filled 2024-04-24 (×3): qty 250

## 2024-04-24 MED ORDER — FENTANYL BOLUS VIA INFUSION
25.0000 ug | INTRAVENOUS | Status: DC | PRN
  Administered 2024-04-24 (×2): 25 ug via INTRAVENOUS
  Administered 2024-04-25 (×5): 100 ug via INTRAVENOUS
  Administered 2024-04-25: 50 ug via INTRAVENOUS
  Administered 2024-04-25: 100 ug via INTRAVENOUS
  Administered 2024-04-25: 50 ug via INTRAVENOUS
  Administered 2024-04-26 (×3): 100 ug via INTRAVENOUS

## 2024-04-24 MED ORDER — ALBUMIN HUMAN 5 % IV SOLN
25.0000 g | Freq: Once | INTRAVENOUS | Status: AC
  Administered 2024-04-24: 25 g via INTRAVENOUS
  Filled 2024-04-24: qty 500

## 2024-04-24 NOTE — Progress Notes (Signed)
 Pt receives out-pt HD at [facility] on [facility]. Clinic staff advised pt has been hospitalized again. Clinic will run on normal schedule next week. Will assist at needed.   Randine Mungo Dialysis Navigator [phone]

## 2024-04-24 NOTE — Progress Notes (Addendum)
 eLink Physician-Brief Progress Note Patient Name: Ashley Freeman DOB: 06/24/1990 MRN: [id number]   Date of Service  04/24/2024  HPI/Events of Note  Issues with IV line. Right arm fistula. Has only one left arm PIV which has 2 lines in it. One of them is blocked. RN called IV team and they are unable to place another IV. Has only one functioning port which is also giving issues so RN   eICU Interventions  Notified ground team of above. Will likely need a CVC     Intervention Category Minor Interventions: Communication with other healthcare providers and/or family  Lorella Gomez G Eknoor Novack 04/24/2024, 11:36 PM  425 am - Asked to urgently see patient on camera. The PIV has stopped working and RN had given fentanyl  bolus of 200 mic with no effect at all. Was also on 1 mic of precedex . Is fighting staff who are holding her down. IM valium  ordered x 1 and called and spoke with ground team who are heading up to place CVC.

## 2024-04-24 NOTE — Procedures (Addendum)
 Patient Name: SHYLER HAMILL  MRN: [id number]  Epilepsy Attending: Arlin MALVA Krebs  Referring Physician/Provider: everitt Clint Abbey Earle FORBES, NP  Duration: 04/23/2024 1644 to 04/24/2024 1644  Patient history:  33 y.o. female who originally presented with generalized pain. She was noted to have an extremely high blood glucose and was found to be in DKA.  She then had seizure activity in the emergency department. EEG to evaluate for seizure  Level of alertness:comatose  AEDs during EEG study: LCM, Propofol   Technical aspects: This EEG study was done with scalp electrodes positioned according to the 10-20 International system of electrode placement. Electrical activity was reviewed with band pass filter of 1-70Hz , sensitivity of 7 uV/mm, display speed of 49mm/sec with a 60Hz  notched filter applied as appropriate. EEG data were recorded continuously and digitally stored.  Video monitoring was available and reviewed as appropriate.  Description: EEG initially showed burst suppression with bursts of generalized 5 to 6 Hz theta slowing lasting 1-2 seconds admixed with 3-7 seconds of generalized suppression. Gradually EEG evolved into near continuous generalized 3-6hz  theta-delta slowing. Hyperventilation and photic stimulation were not performed.     EKG artifact was seen during the study  ABNORMALITY - Burst suppression, generalized - Continuous slow, generalized  IMPRESSION: This study is suggestive of severe to profound diffuse encephalopathy, likely related to sedation. No seizures or epileptiform discharges were seen throughout the recording.  Ricahrd Schwager O Samentha Perham

## 2024-04-24 NOTE — Progress Notes (Signed)
 LTM maint complete - no skin breakdown under: Fp2 Fp1  Serviced P4  [facility] monitored

## 2024-04-24 NOTE — TOC Progression Note (Signed)
 Transition of Care [facility]) - Progression Note    Patient Details  Name: Ashley Freeman MRN: [id number] Date of Birth: Aug 13, 1990  [facility] CM/SW Contact  Corean JAYSON Canary, RN Phone Number: 04/24/2024, 12:54 PM  Clinical Narrative:     Consult to try to figure out why the patient cannot seem to stay out of the hospital, has had 45 ED/and OR hospitalization visits  this year. Chart reviewed. She sees primary care at [facility] in [place]. She has TOC follow up from office with every admission documented, sometimes they have to leave messages. She shares some SDOH need, varies with calls.  Primary reasons for admission are DKA and need for dialysis.  She sometimes misses dialysis and is incompliant with medication  Often times she is in the hospital again before she has a follow up appointment.  Recommend Palliative care for goal setting IPCm will continue to follow   Expected Discharge Plan: Home/Self Care Barriers to Discharge: Continued Medical Work up               Expected Discharge Plan and Services       Living arrangements for the past 2 months: Single Family Home                                       Social Drivers of Health (SDOH) Interventions SDOH Screenings   Food Insecurity: No Food Insecurity (04/13/2024)  Housing: Low Risk (04/13/2024)  Transportation Needs: No Transportation Needs (04/13/2024)  Utilities: Not At Risk (04/13/2024)  Financial Resource Strain: Medium Risk (11/15/2023)   Received from [facility]  Physical Activity: Insufficiently Active (03/02/2022)   Received from [facility]  Social Connections: Moderately Integrated (02/04/2024)  Stress: No Stress Concern Present (03/08/2023)   Received from [facility]  Tobacco Use: Low Risk (04/23/2024)    Readmission Risk Interventions    03/23/2024    2:19 PM 02/06/2024    2:01 PM 11/28/2023   11:49 AM  Readmission Risk Prevention Plan  Transportation  Screening Complete Complete Complete  Medication Review Oceanographer) Complete Referral to Pharmacy Complete  PCP or Specialist appointment within 3-5 days of discharge Complete Complete Complete  HRI or Home Care Consult Complete Complete Complete  SW Recovery Care/Counseling Consult  Complete   Palliative Care Screening Not Applicable Not Applicable Not Applicable  Skilled Nursing Facility Not Applicable Not Applicable Not Applicable

## 2024-04-24 NOTE — Progress Notes (Signed)
 Dear Doctor: This patient has been identified as a candidate for CVC (renal pt, not appropriate for PICC/midline) for the following reason (s): IV therapy over 48 hours, drug pH or osmolality (causing phlebitis, infiltration in 24 hours), drug extravasation potential with tissue necrosis (KCL, Dilantin, Dopamine, CaCl, MgSO4, chemo vesicant), poor veins/poor circulatory system (CHF, COPD, emphysema, diabetes, steroid use, IV drug abuse, etc.), restarts due to phlebitis and infiltration in 24 hours, and incompatible drugs (aminophyllin, TPN, heparin , given with an antibiotic) If you agree, please write an order for the indicated device.  Thank you for supporting the early vascular access assessment program.

## 2024-04-24 NOTE — Progress Notes (Signed)
 eLink Physician-Brief Progress Note Patient Name: Ashley Freeman DOB: 1991/02/25 MRN: [id number]   Date of Service  04/24/2024  HPI/Events of Note  RN requesting orders to transition off insulin  gtt. Patient was in DKA. Gap closed, CO2 normal, BHA cleared. Currently CBG is 166 on 0.8 of insulin .  Lab drawn for above is at 8 PM last night.   eICU Interventions  Discussed with RN. Call  back with new BMP for latest values before shifting to ssi/lantus .      Intervention Category Intermediate Interventions: Hyperglycemia - evaluation and treatment  Jodelle ONEIDA Hutching 04/24/2024, 3:22 AM

## 2024-04-24 NOTE — Progress Notes (Signed)
 "  NAME:  Ashley Freeman, MRN:  [id number], DOB:  11-Sep-1990, LOS: 1 ADMISSION DATE:  04/23/2024, CONSULTATION DATE:  04/23/24 REFERRING MD:  Maximino Sharps, MD CHIEF COMPLAINT:  Seizures, vomiting   History of Present Illness:  33 year old female with below PMHx with recent hospitalization for seizure from 12/15-12/20 and prior to this DKA on 12/10. She presented on 12/25 for general pain, weakness and malaise. Patient reported as poor historian. Labs significant for Na 113, K >7.5 CO2 <7 glucose 1200, elevated LFTs. She was started on insulin  gtt and IVF for DKA. VBG with pH 7.185. Nephrology consulted for HD. PCCM consulted for worsening mental status with facial twitching, initially protecting airway. On exam patient had emesis and not protecting airway so intubated in the ED.  Pertinent  Medical History  Seizure, ESRD on HD, DM1 - poorly controlled, bipolar disorder  Significant Hospital Events: Including procedures, antibiotic start and stop dates in addition to other pertinent events     Interim History / Subjective:  As above  Objective    Blood pressure (!) 85/48, pulse 94, temperature 99.8 F (37.7 C), temperature source Axillary, resp. rate 18, weight 66.4 kg, SpO2 100%.    Vent Mode: PRVC FiO2 (%):  [40 %-100 %] 40 % Set Rate:  [16 bmp-18 bmp] 18 bmp Vt Set:  [420 mL] 420 mL PEEP:  [5 cmH20] 5 cmH20 Pressure Support:  [5 cmH20] 5 cmH20 Plateau Pressure:  [18 cmH20-19 cmH20] 18 cmH20   Intake/Output Summary (Last 24 hours) at 04/24/2024 1158 Last data filed at 04/24/2024 0800 Gross per 24 hour  Intake 2594.52 ml  Output 50 ml  Net 2544.52 ml   Filed Weights   04/23/24 1620 04/24/24 0500  Weight: 53.6 kg 66.4 kg    Physical Exam: General: Critically ill-appearing, actively vomiting, seizing HENT: Van, AT, OP clear, dry mucous membranes Eyes: EOMI, no scleral icterus Respiratory: Rhonchi bilaterally.  Cardiovascular: RRR, -M/R/G, no JVD GI: BS+,  soft, nontender Extremities:Pedal edema,-tenderness Neuro: Encephalopathic, moving extremities x 4, right facial twitching, not following commands  CXR 04/23/24 ETT in place, interval development of right pleural effusion   Resolved problem list   Assessment and Plan    Acute encephalopathy  Seizures Bipolar disorder Med non-adherence  -no sz on cEEG 12/26 P -d/w neuro-- prop to 10, then off -change RASS goal to 0  -AED per neuro -- on vimpat  100mg  BID  -home meds held    Acute hypoxic resp failure Aspiration pna P -rocephin  -follow trach asp -WUA/SBT when prop is ok to shut off. hopefully move toward extubation  -VAP, pulm hygiene   Hypotension R/o Septic shock  -worse leukocytosis, up to 19  P -getting albumin  -start periph NE -send LA -bcx. UA was ordered in ED, not yet collected   -on rocephin  as above. MRSA PCR neg   DKA DM1 P -transition off insulin  gtt -DM coordinator following, appreciate recs   ESRD  -nephro following   Suspected congestive hepatopathy  Recurrent ascites  -follow for para need  -difficult volume status to optimize w ESRD/ HF/ DKA / HD and med noncompliance  Chronic systolic and diastolic HF  Hx HTN  -holding antiHTN meds   Reflux -Continue home med PPI BID  DVT ppx - heparin  subq   About admissions since October, innumerable touches in 2025, > 40 ED touches and admissions in 2025 P -TOC consult. Not sure what exactly the barrier is  Labs   CBC: Recent Labs  Lab 04/23/24 0910 04/23/24 0953 04/23/24 1015 04/23/24 1016 04/23/24 1659 04/24/24 0750  WBC 10.4  --   --   --   --  19.5*  NEUTROABS 7.5  --   --   --   --   --   HGB 10.5* 13.3 12.9 12.6 11.6* 12.6  HCT 39.5 39.0 38.0 37.0 34.0* 36.7  MCV 100.0  --   --   --   --  78.6*  PLT 222  --   --   --   --  222    Basic Metabolic Panel: Recent Labs  Lab 04/19/24 0503 04/23/24 0910 04/23/24 0953 04/23/24 1015 04/23/24 1016 04/23/24 1659 04/23/24 1708  04/23/24 2059 04/24/24 0724  NA 137 113* 113* 114* 113* 122* 120* 127* 125*  K 4.9 >7.5* 7.6* 7.1* 7.1* 5.1 6.6* 3.5 4.5  CL 102 79* 92* 93*  --   --  87* 89* 90*  CO2 26 <7*  --   --   --   --  13* 24 21*  GLUCOSE 210* >1,200* >700* >700*  --   --  904* 287* 117*  BUN 24* 88* 106* 89*  --   --  70* 37* 39*  CREATININE 3.49* 6.79* 7.00* 7.00*  --   --  5.56* 3.23* 3.87*  CALCIUM  8.2* 8.8*  --   --   --   --  8.9 8.4* 8.2*   GFR: Estimated Creatinine Clearance: 19.3 mL/min (A) (by C-G formula based on SCr of 3.87 mg/dL (H)). Recent Labs  Lab 04/23/24 0910 04/24/24 0750  WBC 10.4 19.5*    Liver Function Tests: Recent Labs  Lab 04/23/24 0910  AST 59*  ALT 133*  ALKPHOS 722*  BILITOT 0.6  PROT 5.8*  ALBUMIN  3.6   No results for input(s): LIPASE, AMYLASE in the last 168 hours. No results for input(s): AMMONIA in the last 168 hours.  ABG    Component Value Date/Time   PHART 7.223 (L) 04/23/2024 1659   PCO2ART 45.4 04/23/2024 1659   PO2ART 298 (H) 04/23/2024 1659   HCO3 18.8 (L) 04/23/2024 1659   TCO2 20 (L) 04/23/2024 1659   ACIDBASEDEF 9.0 (H) 04/23/2024 1659   O2SAT 100 04/23/2024 1659     Coagulation Profile: No results for input(s): INR, PROTIME in the last 168 hours.  Cardiac Enzymes: No results for input(s): CKTOTAL, CKMB, CKMBINDEX, TROPONINI in the last 168 hours.  HbA1C: Hemoglobin A1C  Date/Time Value Ref Range Status  11/11/2013 04:31 AM 14.6 (H) 4.2 - 6.3 % Final    Comment:    The American Diabetes Association recommends that a primary goal of therapy should be <7% and that physicians should reevaluate the treatment regimen in patients with HbA1c values consistently >8%.   09/12/2011 02:53 AM SEE COMMENT 4.2 - 6.3 % Final    Comment:    The American Diabetes Association recommends that a primary goal of therapy should be <7% and that physicians should reevaluate the treatment regimen in patients with HbA1c values consistently  >8%. HGB A1C - Unable to perform testing at [facility] due  - to interfering substance. HGB A1C - H -- 16.8 %  - Reference Range: 4.8-5.6  - ----------------------------------------  - Increased risk for diabetes: 5.7-6.4  - Diabetes: >6.4  - Glycemic control for adults with  - .SABRA... diabetes: <7.0  - ----------------------------------------  - PERFORMED BY [facility] [place] [facility] SPEC.NO.: [id number]  - [address],[place] [address]  - ELSIE JULIANNA DROSS, MD  -  [phone]    HbA1c, POC (controlled diabetic range)  Date/Time Value Ref Range Status  06/04/2018 09:29 AM   Final    Comment:    >15   Hgb A1c MFr Bld  Date/Time Value Ref Range Status  03/30/2024 03:34 AM 8.5 (H) 4.8 - 5.6 % Final    Comment:    (NOTE)         Prediabetes: 5.7 - 6.4         Diabetes: >6.4         Glycemic control for adults with diabetes: <7.0   12/20/2023 03:05 AM 10.8 (H) 4.8 - 5.6 % Final    Comment:    (NOTE) Diagnosis of Diabetes The following HbA1c ranges recommended by the American Diabetes Association (ADA) may be used as an aid in the diagnosis of diabetes mellitus.  Hemoglobin             Suggested A1C NGSP%              Diagnosis  <5.7                   Non Diabetic  5.7-6.4                Pre-Diabetic  >6.4                   Diabetic  <7.0                   Glycemic control for                       adults with diabetes.      CBG: Recent Labs  Lab 04/24/24 0603 04/24/24 0709 04/24/24 0810 04/24/24 1049 04/24/24 1110  GLUCAP 128* 90 90 151* 184*   CRITICAL CARE Performed by: Ronnald FORBES Gave   Total critical care time: 50 minutes  Critical care time was exclusive of separately billable procedures and treating other patients. Critical care was necessary to treat or prevent imminent or life-threatening deterioration.  Critical care was time spent personally by me on the following activities: development of treatment plan with patient and/or surrogate  as well as nursing, discussions with consultants, evaluation of patient's response to treatment, examination of patient, obtaining history from patient or surrogate, ordering and performing treatments and interventions, ordering and review of laboratory studies, ordering and review of radiographic studies, pulse oximetry and re-evaluation of patient's condition.  Ronnald Gave MSN, AGACNP-BC [facility] Amion for pager  04/24/2024, 11:58 AM         "

## 2024-04-24 NOTE — Progress Notes (Signed)
 Subjective: No acute events overnight.  No concerns.  ROS: Unable to obtain due to intubation/sedation Examination  Vital signs in last 24 hours: Temp:  [97.5 F (36.4 C)-100.7 F (38.2 C)] 100.7 F (38.2 C) (12/26 1200) Pulse Rate:  [74-96] 94 (12/26 1000) Resp:  [0-28] 18 (12/26 1000) BP: (82-191)/(47-97) 85/48 (12/26 1000) SpO2:  [98 %-100 %] 100 % (12/26 1130) FiO2 (%):  [40 %-100 %] 40 % (12/26 1130) Weight:  [53.6 kg-66.4 kg] 66.4 kg (12/26 0500)  General: lying in bed, intubated Neuro: Comatose, barely opens eyes to noxious stimulation, did not follow commands, did not track examiner, PERRLA, no postintubation, corneal reflex intact, cough reflex intact, withdraws to noxious stimuli in all 4 extremities  Basic Metabolic Panel: Recent Labs  Lab 04/19/24 0503 04/23/24 0910 04/23/24 0953 04/23/24 1015 04/23/24 1016 04/23/24 1659 04/23/24 1708 04/23/24 2059 04/24/24 0724  NA 137 113* 113* 114* 113* 122* 120* 127* 125*  K 4.9 >7.5* 7.6* 7.1* 7.1* 5.1 6.6* 3.5 4.5  CL 102 79* 92* 93*  --   --  87* 89* 90*  CO2 26 <7*  --   --   --   --  13* 24 21*  GLUCOSE 210* >1,200* >700* >700*  --   --  904* 287* 117*  BUN 24* 88* 106* 89*  --   --  70* 37* 39*  CREATININE 3.49* 6.79* 7.00* 7.00*  --   --  5.56* 3.23* 3.87*  CALCIUM  8.2* 8.8*  --   --   --   --  8.9 8.4* 8.2*    CBC: Recent Labs  Lab 04/23/24 0910 04/23/24 0953 04/23/24 1015 04/23/24 1016 04/23/24 1659 04/24/24 0750  WBC 10.4  --   --   --   --  19.5*  NEUTROABS 7.5  --   --   --   --   --   HGB 10.5* 13.3 12.9 12.6 11.6* 12.6  HCT 39.5 39.0 38.0 37.0 34.0* 36.7  MCV 100.0  --   --   --   --  78.6*  PLT 222  --   --   --   --  222     Coagulation Studies: No results for input(s): LABPROT, INR in the last 72 hours.  Imaging personally reviewed  CT head without contrast 04/13/2024: No acute intracranial abnormality. Skull base atherosclerosis greater than expected for age. There is diffuse  stranding of the facial subcutaneous fat bilaterally which could reflect edema/anasarca.   ASSESSMENT AND PLAN:33 y.o. female with a PMHx of poorly controlled DM Type 1, seizures, Bipolar Disorder, ESRD on HD, CHFrEF, HTN, HLD and poor medication compliance who originally presented with generalized pain.  She was noted to have an extremely high blood glucose and was found to be in DKA.  She then had some seizure activity in the emergency department, vomited, aspirated and was intubated for airway protection.  She has had 2 recent hospitalizations this month for seizure and DKA.  Patient was in the process of titrating up Lamictal  to a therapeutic dose and was then supposed to stop Keppra . He Lamictal  level is < 1.0, which is consistent with non-compliance. It is unclear why this medication change was being made and patient unfortunately cannot give history right now regarding possible side effects from Keppra .  Given compliance issues, would be concerned about patient stopping therapeutic Lamictal  and then abruptly starting it again, leading to increased risk of Stevens-Johnson syndrome. Therefore, we will initiate Vimpat  for seizure control.  Epilepsy with breakthrough seizures -Continue Vimpat  100 mg twice daily - Reduce propofol  to 10 and in 1 hour okay to stop as no seizures on LTM - Will continue LTM EEG today and likely DC tomorrow to monitor for seizure recurrence - Okay to use Precedex  for agitation/sedation if needed - Management of other metabolic abnormalities per primary team - Will need to counsel patient about compliance when she is more awake - Discussed plan with ICU team via secure chat  CRITICAL CARE Performed by: Arlin MALVA Krebs   Total critical care time: 32 minutes  Critical care time was exclusive of separately billable procedures and treating other patients.  Critical care was necessary to treat or prevent imminent or life-threatening deterioration.  Critical care  was time spent personally by me on the following activities: development of treatment plan with patient and/or surrogate as well as nursing, discussions with consultants, evaluation of patient's response to treatment, examination of patient, obtaining history from patient or surrogate, ordering and performing treatments and interventions, ordering and review of laboratory studies, ordering and review of radiographic studies, pulse oximetry and re-evaluation of patient's condition.     Arlin Krebs [facility] For questions after 5pm please refer to AMION to reach the Neurologist on call

## 2024-04-24 NOTE — TOC Initial Note (Signed)
 Transition of Care [facility]) - Initial/Assessment Note    Patient Details  Name: Ashley Freeman MRN: [id number] Date of Birth: [birth year]  [facility] CM/SW Contact:    Tally Mckinnon E Jorden Minchey, LCSW Phone Number: 04/24/2024, 12:02 PM  Clinical Narrative:                 Patient is known to [facility] for recent admission. Patient was discharged home on 12/21. Readmitted 12/25. Questionable diabetes medication compliance per notes - Diabetes Coordinator consult. Patient is currently intubated and in restraints. Patient is a Dialysis patient at [facility], uses Medicaid transportation services. ICM will follow during this stay.   Expected Discharge Plan: Home/Self Care Barriers to Discharge: Continued Medical Work up   Patient Goals and CMS Choice            Expected Discharge Plan and Services       Living arrangements for the past 2 months: Single Family Home                                      Prior Living Arrangements/Services Living arrangements for the past 2 months: Single Family Home Lives with:: Parents                   Activities of Daily Living      Permission Sought/Granted                  Emotional Assessment         Alcohol  / Substance Use: Not Applicable Psych Involvement: No (comment)  Admission diagnosis:  DKA (diabetic ketoacidosis) (HCC) [E11.10] DKA, type 1 (HCC) [E10.10] Diabetic ketoacidosis without coma associated with type 1 diabetes mellitus (HCC) [E10.10] Diabetic ketoacidosis associated with type 1 diabetes mellitus (HCC) [E10.10] Patient Active Problem List   Diagnosis Date Noted   Ascites 04/23/2024   Seizure disorder (HCC) 04/13/2024   Acute on chronic diastolic heart failure (HCC) 03/22/2024   CAP (community acquired pneumonia) 03/22/2024   Diabetic ketoacidosis associated with type 1 diabetes mellitus (HCC) 03/16/2024   Neck abscess 02/22/2024   Sore throat 02/22/2024   HFrEF (heart  failure with reduced ejection fraction) (HCC)    Chest pain 02/03/2024   Odynophagia 01/31/2024   Esophageal dysphagia 01/31/2024   Erosive esophagitis 01/31/2024   Hematemesis 01/26/2024   High anion gap metabolic acidosis 01/16/2024   Allergic rhinitis 01/16/2024   History of anemia due to chronic kidney disease 01/16/2024   Hypervolemia associated with renal insufficiency 12/19/2023   Pain and swelling of right upper extremity 12/19/2023   History of seizure disorder 11/17/2023   Hyperosmolar hyperglycemic state (HHS) (HCC) 11/03/2023   Volume overload 11/03/2023   Vulvar pain 11/03/2023   Generalized abdominal pain 08/27/2023   Elevated LFTs 07/20/2023   Cholecystitis 06/30/2023   Prolonged QT interval 06/30/2023   Bipolar disorder (HCC) 06/30/2023   Hyperglycemic crisis due to Type 1 diabetes mellitus (HCC) 05/20/2023   Acute on chronic HFrEF (heart failure with reduced ejection fraction) (HCC) 05/20/2023   Chronic combined systolic and diastolic heart failure (HCC) 05/20/2023   Generalized pain 05/20/2023   Hypoglycemia 09/08/2022   Dilated cardiomyopathy (HCC) 09/08/2022   Seizures (HCC) 09/08/2022   ESRD on hemodialysis (HCC) 09/08/2022   Altered mental status 09/07/2022   Type 1 diabetes mellitus with retinopathy (HCC) 06/22/2022   Unemployed 03/31/2022   Housing instability, currently housed 03/31/2022  Anemia due to chronic kidney disease 12/23/2021   LV dysfunction 11/15/2021   Scalp lesion 11/15/2021   C. difficile diarrhea 11/15/2021   SIRS (systemic inflammatory response syndrome) (HCC) 11/06/2021   LGSIL on Pap smear of cervix 09/24/2021   DKA (diabetic ketoacidosis) (HCC) 07/26/2021   Hypertension associated with diabetes (HCC) 07/26/2021   N&V (nausea and vomiting) 06/20/2021   Type 1 diabetes mellitus with chronic kidney disease on chronic dialysis (HCC) 05/26/2021   Dyslipidemia 05/26/2021   Anasarca 04/16/2021   Mild protein malnutrition 03/07/2021    DKA, type 1 (HCC) 12/08/2020   Diabetes mellitus type I (HCC) 02/08/2020   Transaminitis 10/05/2019   Leg edema, left 09/27/2019   Systolic ejection murmur 08/03/2019   Type 1 diabetes mellitus with hyperglycemia (HCC) 07/24/2019   Diabetic retinopathy (HCC) 07/02/2019   DM (diabetes mellitus), type 1 with renal complications (HCC) 05/21/2019   Diarrhea 05/31/2016   Diabetic neuropathy (HCC) 01/16/2016   Preop cardiovascular exam 08/24/2013   Pseudohyponatremia 08/04/2012   Hypokalemia 05/07/2012   Lactic acidosis 02/19/2012   Leukocytosis 02/19/2012   Normocytic anemia 02/19/2012   Hyperkalemia 02/19/2012   Hyperglycemia 11/06/2011   Stable proliferative diabetic retinopathy associated with type 1 diabetes mellitus (HCC) 10/13/1997   PCP:  Keven Crumbly Pap, MD Pharmacy:   [facility] - [place] - [address] [place] [address] Phone: [phone] Fax: [phone]  [facility] [address] [place] [address] Phone: [phone] Fax: [phone]  [facility] [address] [place] [address] Phone: [phone] Fax: [phone]     Social Drivers of Health (SDOH) Social History: SDOH Screenings   Food Insecurity: No Food Insecurity (04/13/2024)  Housing: Low Risk (04/13/2024)  Transportation Needs: No Transportation Needs (04/13/2024)  Utilities: Not At Risk (04/13/2024)  Financial Resource Strain: Medium Risk (11/15/2023)   Received from [facility]  Physical Activity: Insufficiently Active (03/02/2022)   [facility]  Social Connections: Moderately Integrated (02/04/2024)  Stress: No Stress Concern Present (03/08/2023)   Received from [facility]  Tobacco Use: Low Risk (04/23/2024)   SDOH Interventions:     Readmission Risk Interventions    03/23/2024    2:19 PM 02/06/2024    2:01  PM 11/28/2023   11:49 AM  Readmission Risk Prevention Plan  Transportation Screening Complete Complete Complete  Medication Review Oceanographer) Complete Referral to Pharmacy Complete  PCP or Specialist appointment within 3-5 days of discharge Complete Complete Complete  HRI or Home Care Consult Complete Complete Complete  SW Recovery Care/Counseling Consult  Complete   Palliative Care Screening Not Applicable Not Applicable Not Applicable  Skilled Nursing Facility Not Applicable Not Applicable Not Applicable

## 2024-04-24 NOTE — Inpatient Diabetes Management (Signed)
 Inpatient Diabetes Program Recommendations  AACE/ADA: New Consensus Statement on Inpatient Glycemic Control (2015)  Target Ranges:  Prepandial:   less than 140 mg/dL      Peak postprandial:   less than 180 mg/dL (1-2 hours)      Critically ill patients:  140 - 180 mg/dL   Lab Results  Component Value Date   GLUCAP 90 04/24/2024   HGBA1C 8.5 (H) 03/30/2024    Review of Glycemic Control  Diabetes history: T1DM Outpatient Diabetes medications:  Lantus  5 units every day, Novolog  2-8 units TID Current orders for Inpatient glycemic control: IV insulin   Inpatient Diabetes Program Recommendations:    Please consider for transition to SQ insulin :  Lantus  5 units every day (allow IV insulin  to run for 2 hrs after administration) Novolog  0-6 units Q4H  Patient very familiar to the DM team.    Thank you, Wyvonna Pinal, MSN, CDCES Diabetes Coordinator Inpatient Diabetes Program [phone] (team pager from 8a-5p)

## 2024-04-24 NOTE — Progress Notes (Signed)
 [facility] NEPHROLOGY PROGRESS NOTE  Assessment/ Plan: Pt is a 33 y.o. yo female with type 1 diabetes, CHF, ESRD on HD following per dialysis management. Dialysis Orders: [facility] [address] [phone]), TTS 3:45hr, 400/800, EDW 57.5kg, 2K/2.5Ca, R AVG Heparin  bolus 1600 units with HD Calcitriol  1mcg Sensipar  60mg  with HD  TUMS 1500mg  with HD   # Seizure disorder, acute metabolic encephalopathy: Neurology is following.  Currently intubated.  # ESRD on HD: TTS schedule, completed dialysis yesterday.  Plan for next HD tomorrow.  # Severe hyperkalemia presumably due to missed HD, improved with dialysis.  # Hyponatremia due to hyperglycemia and hypervolemia: Currently on insulin , discontinue dextrose  fluid, UF with HD.  # Anemia: Hemoglobin above goal, no need for ESA.  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  # HTN/volume: Euvolemic, blood pressure low this morning.  Currently on sedation.   Subjective: Seen and examined in ICU.  Currently intubated, sedated.  Received HD yesterday.  Discussed with ICU nurse. Objective Vital signs in last 24 hours: Vitals:   04/24/24 1000 04/24/24 1058 04/24/24 1130 04/24/24 1200  BP: (!) 85/48     Pulse: 94     Resp: 18     Temp:    (!) 100.7 F (38.2 C)  TempSrc:    Axillary  SpO2: 100% 99% 100%   Weight:       Weight change:   Intake/Output Summary (Last 24 hours) at 04/24/2024 1248 Last data filed at 04/24/2024 0800 Gross per 24 hour  Intake 2594.52 ml  Output 50 ml  Net 2544.52 ml       Labs: RENAL PANEL Recent Labs  Lab 04/19/24 0503 04/23/24 0910 04/23/24 0953 04/23/24 1015 04/23/24 1016 04/23/24 1659 04/23/24 1708 04/23/24 2059 04/24/24 0724  NA 137 113* 113* 114* 113* 122* 120* 127* 125*  K 4.9 >7.5* 7.6* 7.1* 7.1* 5.1 6.6* 3.5 4.5  CL 102 79* 92* 93*  --   --  87* 89* 90*  CO2 26 <7*  --   --   --   --  13* 24 21*  GLUCOSE 210* >1,200* >700* >700*  --   --   904* 287* 117*  BUN 24* 88* 106* 89*  --   --  70* 37* 39*  CREATININE 3.49* 6.79* 7.00* 7.00*  --   --  5.56* 3.23* 3.87*  CALCIUM  8.2* 8.8*  --   --   --   --  8.9 8.4* 8.2*  ALBUMIN   --  3.6  --   --   --   --   --   --   --     Liver Function Tests: Recent Labs  Lab 04/23/24 0910  AST 59*  ALT 133*  ALKPHOS 722*  BILITOT 0.6  PROT 5.8*  ALBUMIN  3.6   No results for input(s): LIPASE, AMYLASE in the last 168 hours. No results for input(s): AMMONIA in the last 168 hours. CBC: Recent Labs    11/17/23 1927 11/18/23 0630 11/18/23 0822 11/26/23 0045 04/23/24 0910 04/23/24 0953 04/23/24 1015 04/23/24 1016 04/23/24 1659 04/24/24 0750  HGB 10.2* 10.9*  --    < > 10.5* 13.3 12.9 12.6 11.6* 12.6  MCV 88.0 88.4  --    < > 100.0  --   --   --   --  78.6*  VITAMINB12  --  1,177*  --   --   --   --   --   --   --   --  FOLATE  --  12.1  --   --   --   --   --   --   --   --   FERRITIN  --   --  2,586*  --   --   --   --   --   --   --   TIBC  --   --  283  --   --   --   --   --   --   --   IRON   --   --  126  --   --   --   --   --   --   --   RETICCTPCT 2.4  --   --   --   --   --   --   --   --   --    < > = values in this interval not displayed.    Cardiac Enzymes: No results for input(s): CKTOTAL, CKMB, CKMBINDEX, TROPONINI in the last 168 hours. CBG: Recent Labs  Lab 04/24/24 0603 04/24/24 0709 04/24/24 0810 04/24/24 1049 04/24/24 1110  GLUCAP 128* 90 90 151* 184*    Iron  Studies: No results for input(s): IRON , TIBC, TRANSFERRIN, FERRITIN in the last 72 hours. Studies/Results: Overnight EEG with video Result Date: 04/24/2024 Shelton Arlin KIDD, MD     04/24/2024  9:23 AM Patient Name: HENRYETTA CORRIVEAU MRN: [id number] Epilepsy Attending: Arlin KIDD Shelton Referring Physician/Provider: everitt Clint Abbey Earle FORBES, NP Duration: Patient history:  33 y.o. female who originally presented with generalized pain. She was noted to have an  extremely high blood glucose and was found to be in DKA.  She then had seizure activity in the emergency department. EEG to evaluate for seizure Level of alertness:comatose AEDs during EEG study: LCM, Propofol  Technical aspects: This EEG study was done with scalp electrodes positioned according to the 10-20 International system of electrode placement. Electrical activity was reviewed with band pass filter of 1-70Hz , sensitivity of 7 uV/mm, display speed of 66mm/sec with a 60Hz  notched filter applied as appropriate. EEG data were recorded continuously and digitally stored.  Video monitoring was available and reviewed as appropriate. Description: EEG initially showed burst suppression with bursts of generalized 5 to 6 Hz theta slowing lasting 1-2 seconds admixed with 3-7 seconds of generalized suppression. Gradually EEG evolved into near continuous generalized 3-6hz  theta-delta slowing. Hyperventilation and photic stimulation were not performed.   EKG artifact was seen during the study ABNORMALITY - Burst suppression, generalized - Continuous slow, generalized IMPRESSION: This study is suggestive of severe to profound diffuse encephalopathy, likely related to sedation. No seizures or epileptiform discharges were seen throughout the recording. Arlin KIDD Shelton   DG Abd Portable 1V Result Date: 04/23/2024 CLINICAL DATA:  Nasogastric tube placement. EXAM: PORTABLE ABDOMEN - 1 VIEW COMPARISON:  None Available. FINDINGS: Nasogastric tube terminates in the stomach with the side port a few cm beyond the gastroesophageal junction. Surgical clips in the right upper quadrant. Abdomen is otherwise grossly unremarkable. IMPRESSION: Nasogastric tube terminates in the stomach. Chest radiograph dictated separately. Electronically Signed   By: Newell Eke M.D.   On: 04/23/2024 16:38   DG CHEST PORT 1 VIEW Result Date: 04/23/2024 CLINICAL DATA:  Nasogastric tube placement. EXAM: PORTABLE CHEST 1 VIEW COMPARISON:   04/23/2024 and CT chest 04/16/2024. FINDINGS: Endotracheal to terminates approximately 2.5 cm above the carina. Nasogastric tube is followed into the stomach with the side port likely a few cm beyond  the gastroesophageal junction. Tip extends beyond the inferior margin of the image. Patient is rotated. Heart is enlarged. New right upper and right lower lobe collapse/consolidation with left perihilar and left lower lobe volume loss. Moderate right pleural effusion. No pneumothorax. IMPRESSION: 1. Satisfactory endotracheal tube placement. 2. New collapse/consolidation in the right upper and right lower lobes, possibly due to mucous plugging. 3. Moderate right pleural effusion. 4. Left perihilar and left lower lobe atelectasis. Electronically Signed   By: Newell Eke M.D.   On: 04/23/2024 16:37   DG Chest 2 View Result Date: 04/23/2024 CLINICAL DATA:  Recent pneumonia.  Fatigue. EXAM: CHEST - 2 VIEW COMPARISON:  04/13/2024 FINDINGS: Low volume film. The cardio pericardial silhouette is enlarged. Diffuse right-sided and retrocardiac left base airspace disease seen previously has decreased in the interval. No dense focal consolidative airspace disease on the current study. No substantial pleural effusion. No acute bony abnormality. Telemetry leads overlie the chest. IMPRESSION: Interval decrease in diffuse right-sided and retrocardiac left base airspace disease. No dense focal consolidative airspace disease on the current study. Electronically Signed   By: Camellia Candle M.D.   On: 04/23/2024 10:12    Medications: Infusions:  sodium chloride      cefTRIAXone  (ROCEPHIN )  IV Stopped (04/23/24 1805)   dexmedetomidine  (PRECEDEX ) IV infusion     lacosamide  (VIMPAT ) IV 100 mg (04/24/24 1132)   norepinephrine  (LEVOPHED ) Adult infusion      Scheduled Medications:  Chlorhexidine  Gluconate Cloth  6 each Topical Q0600   docusate  100 mg Per Tube BID   heparin   5,000 Units Subcutaneous Q8H   insulin  aspart  0-6  Units Subcutaneous Q4H   insulin  glargine  5 Units Subcutaneous Daily   mouth rinse  15 mL Mouth Rinse Q2H   pantoprazole  (PROTONIX ) IV  40 mg Intravenous Q12H   polyethylene glycol  17 g Per Tube Daily   sodium chloride  flush  3 mL Intravenous Q12H    have reviewed scheduled and prn medications.  Physical Exam: General: Ill looking young female, intubated, sedated. Heart:RRR, s1s2 nl Lungs: Coarse breath sound bilateral. Abdomen:soft,  non-distended Extremities:No edema Dialysis Access: AV graft has good thrill.   Ala Kratz Prasad Kavontae Pritchard 04/24/2024,12:48 PM  LOS: 1 day

## 2024-04-25 ENCOUNTER — Inpatient Hospital Stay (HOSPITAL_COMMUNITY)

## 2024-04-25 DIAGNOSIS — G40909 Epilepsy, unspecified, not intractable, without status epilepticus: Secondary | ICD-10-CM | POA: Diagnosis not present

## 2024-04-25 DIAGNOSIS — E1022 Type 1 diabetes mellitus with diabetic chronic kidney disease: Secondary | ICD-10-CM | POA: Diagnosis not present

## 2024-04-25 DIAGNOSIS — G9341 Metabolic encephalopathy: Secondary | ICD-10-CM

## 2024-04-25 DIAGNOSIS — N186 End stage renal disease: Secondary | ICD-10-CM

## 2024-04-25 DIAGNOSIS — E1051 Type 1 diabetes mellitus with diabetic peripheral angiopathy without gangrene: Secondary | ICD-10-CM

## 2024-04-25 DIAGNOSIS — Z992 Dependence on renal dialysis: Secondary | ICD-10-CM | POA: Diagnosis not present

## 2024-04-25 DIAGNOSIS — Z794 Long term (current) use of insulin: Secondary | ICD-10-CM

## 2024-04-25 DIAGNOSIS — E1065 Type 1 diabetes mellitus with hyperglycemia: Secondary | ICD-10-CM

## 2024-04-25 DIAGNOSIS — I132 Hypertensive heart and chronic kidney disease with heart failure and with stage 5 chronic kidney disease, or end stage renal disease: Secondary | ICD-10-CM | POA: Diagnosis not present

## 2024-04-25 DIAGNOSIS — Z91148 Patient's other noncompliance with medication regimen for other reason: Secondary | ICD-10-CM | POA: Diagnosis not present

## 2024-04-25 DIAGNOSIS — E1010 Type 1 diabetes mellitus with ketoacidosis without coma: Secondary | ICD-10-CM | POA: Diagnosis not present

## 2024-04-25 DIAGNOSIS — E10649 Type 1 diabetes mellitus with hypoglycemia without coma: Secondary | ICD-10-CM

## 2024-04-25 DIAGNOSIS — I5022 Chronic systolic (congestive) heart failure: Secondary | ICD-10-CM | POA: Diagnosis not present

## 2024-04-25 DIAGNOSIS — E8770 Fluid overload, unspecified: Secondary | ICD-10-CM

## 2024-04-25 LAB — CBC
HCT: 33.1 % — ABNORMAL LOW (ref 36.0–46.0)
Hemoglobin: 11.1 g/dL — ABNORMAL LOW (ref 12.0–15.0)
MCH: 26.3 pg (ref 26.0–34.0)
MCHC: 33.5 g/dL (ref 30.0–36.0)
MCV: 78.4 fL — ABNORMAL LOW (ref 80.0–100.0)
Platelets: 197 K/uL (ref 150–400)
RBC: 4.22 MIL/uL (ref 3.87–5.11)
RDW: 13.9 % (ref 11.5–15.5)
WBC: 16.4 K/uL — ABNORMAL HIGH (ref 4.0–10.5)
nRBC: 0 % (ref 0.0–0.2)

## 2024-04-25 LAB — RENAL FUNCTION PANEL
Albumin: 2.9 g/dL — ABNORMAL LOW (ref 3.5–5.0)
Anion gap: 19 — ABNORMAL HIGH (ref 5–15)
BUN: 45 mg/dL — ABNORMAL HIGH (ref 6–20)
CO2: 19 mmol/L — ABNORMAL LOW (ref 22–32)
Calcium: 8.6 mg/dL — ABNORMAL LOW (ref 8.9–10.3)
Chloride: 88 mmol/L — ABNORMAL LOW (ref 98–111)
Creatinine, Ser: 4.68 mg/dL — ABNORMAL HIGH (ref 0.44–1.00)
GFR, Estimated: 12 mL/min — ABNORMAL LOW
Glucose, Bld: 223 mg/dL — ABNORMAL HIGH (ref 70–99)
Phosphorus: 4.4 mg/dL (ref 2.5–4.6)
Potassium: 4 mmol/L (ref 3.5–5.1)
Sodium: 126 mmol/L — ABNORMAL LOW (ref 135–145)

## 2024-04-25 LAB — GLUCOSE, CAPILLARY
Glucose-Capillary: 108 mg/dL — ABNORMAL HIGH (ref 70–99)
Glucose-Capillary: 138 mg/dL — ABNORMAL HIGH (ref 70–99)
Glucose-Capillary: 153 mg/dL — ABNORMAL HIGH (ref 70–99)
Glucose-Capillary: 223 mg/dL — ABNORMAL HIGH (ref 70–99)
Glucose-Capillary: 50 mg/dL — ABNORMAL LOW (ref 70–99)
Glucose-Capillary: 51 mg/dL — ABNORMAL LOW (ref 70–99)
Glucose-Capillary: 85 mg/dL (ref 70–99)
Glucose-Capillary: 92 mg/dL (ref 70–99)
Glucose-Capillary: 95 mg/dL (ref 70–99)

## 2024-04-25 LAB — MAGNESIUM: Magnesium: 1.8 mg/dL (ref 1.7–2.4)

## 2024-04-25 LAB — PHOSPHORUS: Phosphorus: 4.5 mg/dL (ref 2.5–4.6)

## 2024-04-25 LAB — HEPATITIS B SURFACE ANTIBODY, QUANTITATIVE: Hep B S AB Quant (Post): <3.5 m[IU]/mL — ABNORMAL LOW

## 2024-04-25 MED ORDER — LIDOCAINE-PRILOCAINE 2.5-2.5 % EX CREA: 1.0000 | TOPICAL_CREAM | CUTANEOUS | Status: DC | PRN

## 2024-04-25 MED ORDER — PROPOFOL 1000 MG/100ML IV EMUL
0.0000 ug/kg/min | INTRAVENOUS | Status: DC
  Administered 2024-04-25: 30 ug/kg/min via INTRAVENOUS
  Administered 2024-04-25: 20 ug/kg/min via INTRAVENOUS
  Administered 2024-04-26: 30 ug/kg/min via INTRAVENOUS
  Filled 2024-04-25 (×3): qty 100

## 2024-04-25 MED ORDER — HEPARIN SODIUM (PORCINE) 1000 UNIT/ML IJ SOLN
INTRAMUSCULAR | Status: AC
  Filled 2024-04-25: qty 2

## 2024-04-25 MED ORDER — MIDAZOLAM HCL (PF) 2 MG/2ML IJ SOLN: 2.0000 mg | INTRAMUSCULAR | Status: DC | PRN

## 2024-04-25 MED ORDER — PENTAFLUOROPROP-TETRAFLUOROETH EX AERO: 1.0000 | INHALATION_SPRAY | CUTANEOUS | Status: DC | PRN

## 2024-04-25 MED ORDER — HEPARIN SODIUM (PORCINE) 1000 UNIT/ML DIALYSIS: 20.0000 [IU]/kg | INTRAMUSCULAR | Status: DC | PRN

## 2024-04-25 MED ORDER — DEXTROSE 50 % IV SOLN
25.0000 g | INTRAVENOUS | Status: AC
  Administered 2024-04-25: 25 g via INTRAVENOUS
  Filled 2024-04-25: qty 50

## 2024-04-25 MED ORDER — HEPARIN SODIUM (PORCINE) 1000 UNIT/ML DIALYSIS: 1000.0000 [IU] | INTRAMUSCULAR | Status: DC | PRN

## 2024-04-25 MED ORDER — ALBUMIN HUMAN 25 % IV SOLN
INTRAVENOUS | Status: AC
  Filled 2024-04-25: qty 100

## 2024-04-25 MED ORDER — HYDRALAZINE HCL 20 MG/ML IJ SOLN
10.0000 mg | Freq: Four times a day (QID) | INTRAMUSCULAR | Status: DC | PRN
  Administered 2024-04-25 – 2024-04-26 (×2): 10 mg via INTRAVENOUS
  Filled 2024-04-25 (×2): qty 1

## 2024-04-25 MED ORDER — LIDOCAINE HCL (PF) 1 % IJ SOLN: 5.0000 mL | INTRAMUSCULAR | Status: DC | PRN

## 2024-04-25 MED ORDER — VITAL HP 1.0 CAL PO LIQD
1000.0000 mL | ORAL | Status: DC
  Administered 2024-04-25: 1000 mL

## 2024-04-25 MED ORDER — ALTEPLASE 2 MG IJ SOLR: 2.0000 mg | Freq: Once | INTRAMUSCULAR | Status: DC | PRN

## 2024-04-25 MED ORDER — MIDAZOLAM HCL (PF) 2 MG/2ML IJ SOLN
2.0000 mg | Freq: Once | INTRAMUSCULAR | Status: DC | PRN
  Filled 2024-04-25: qty 2

## 2024-04-25 MED ORDER — DIAZEPAM 5 MG/ML IJ SOLN
5.0000 mg | Freq: Once | INTRAMUSCULAR | Status: AC
  Administered 2024-04-25: 5 mg via INTRAMUSCULAR
  Filled 2024-04-25: qty 2

## 2024-04-25 MED ORDER — ANTICOAGULANT SODIUM CITRATE 4% (200MG/5ML) IV SOLN: 5.0000 mL | Status: DC | PRN

## 2024-04-25 MED ORDER — INSULIN ASPART 100 UNIT/ML IJ SOLN
0.0000 [IU] | Freq: Three times a day (TID) | INTRAMUSCULAR | Status: DC
  Administered 2024-04-25 – 2024-04-26 (×3): 2 [IU] via SUBCUTANEOUS
  Administered 2024-04-26: 1 [IU] via SUBCUTANEOUS
  Administered 2024-04-27: 3 [IU] via SUBCUTANEOUS
  Administered 2024-04-27 – 2024-04-28 (×2): 2 [IU] via SUBCUTANEOUS
  Filled 2024-04-25: qty 3
  Filled 2024-04-25: qty 2
  Filled 2024-04-25: qty 1
  Filled 2024-04-25: qty 3

## 2024-04-25 MED ORDER — PROSOURCE TF20 ENFIT COMPATIBL EN LIQD
60.0000 mL | Freq: Every day | ENTERAL | Status: DC
  Administered 2024-04-25: 60 mL
  Filled 2024-04-25: qty 60

## 2024-04-25 NOTE — Procedures (Signed)
 Patient Name: Ashley Freeman  MRN: [id number]  Epilepsy Attending: Arlin MALVA Krebs  Referring Physician/Provider: everitt Clint Abbey Earle FORBES, NP  Duration: 04/24/2024 1644 to 04/25/2024 1644   Patient history:  33 y.o. female who originally presented with generalized pain. She was noted to have an extremely high blood glucose and was found to be in DKA.  She then had seizure activity in the emergency department. EEG to evaluate for seizure   Level of alertness: comatose/ lethargic   AEDs during EEG study: LCM, propofol    Technical aspects: This EEG study was done with scalp electrodes positioned according to the 10-20 International system of electrode placement. Electrical activity was reviewed with band pass filter of 1-70Hz , sensitivity of 7 uV/mm, display speed of 55mm/sec with a 60Hz  notched filter applied as appropriate. EEG data were recorded continuously and digitally stored.  Video monitoring was available and reviewed as appropriate.   Description: EEG showed continuous generalized 3-6hz  theta-delta slowing, at times with triphasic morphology. Hyperventilation and photic stimulation were not performed.     ABNORMALITY - Continuous slow, generalized   IMPRESSION: This study is suggestive of severe diffuse encephalopathy. No seizures or epileptiform discharges were seen throughout the recording.   Myrtie Leuthold O Elva Mauro

## 2024-04-25 NOTE — Progress Notes (Signed)
 "  NAME:  Ashley Freeman, MRN:  [id number], DOB:  [birth year], LOS: 2 ADMISSION DATE:  04/23/2024, CONSULTATION DATE:  04/23/24 REFERRING MD:  Maximino Sharps, MD CHIEF COMPLAINT:  Seizures, vomiting   History of Present Illness:  33 year old female with below PMHx with recent hospitalization for seizure from 12/15-12/20 and prior to this DKA on 12/10. She presented on 12/25 for general pain, weakness and malaise. Patient reported as poor historian. Labs significant for Na 113, K >7.5 CO2 <7 glucose 1200, elevated LFTs. She was started on insulin  gtt and IVF for DKA. VBG with pH 7.185. Nephrology consulted for HD. PCCM consulted for worsening mental status with facial twitching, initially protecting airway. On exam patient had emesis and not protecting airway so intubated in the ED.  Pertinent  Medical History  Seizure, ESRD on HD, DM1 - poorly controlled, bipolar disorder  Significant Hospital Events: Including procedures, antibiotic start and stop dates in addition to other pertinent events     Interim History / Subjective:  AG closed, transitioned to SQ insulin . Receiving iHD this AM. Central line overnight lost access.   Objective    Blood pressure 137/69, pulse 81, temperature 99.6 F (37.6 C), temperature source Axillary, resp. rate 18, weight 66.4 kg, SpO2 99%.    Vent Mode: PRVC FiO2 (%):  [40 %] 40 % Set Rate:  [18 bmp] 18 bmp Vt Set:  [420 mL] 420 mL PEEP:  [5 cmH20] 5 cmH20 Pressure Support:  [5 cmH20] 5 cmH20 Plateau Pressure:  [18 cmH20-19 cmH20] 18 cmH20   Intake/Output Summary (Last 24 hours) at 04/25/2024 0801 Last data filed at 04/25/2024 0700 Gross per 24 hour  Intake 1229.07 ml  Output 0 ml  Net 1229.07 ml   Filed Weights   04/23/24 1620 04/24/24 0500  Weight: 53.6 kg 66.4 kg    Physical Exam: General: Chronically ill-appearing, easily agitated with minimal stimulation Eyes: EOMI, no icterus noted Respiratory: No significant tachypnea on  ventilator, breathing Cardiovascular: Regular rate and rhythm, appears sinus on the monitor GI: Nondistended Neuro: Encephalopathic, agitated, moves all extremities  Labs and images reviewed.   Resolved problem list   Assessment and Plan   Presumed seizures: Description of seizure-like activity on admission.  Likely due to hyperglycemia, DKA. -- Continue AEDs per neurology, no seizures demonstrated on EEG  Acute metabolic encephalopathy: Due to hypoglycemia, postictal state etc.  Seemingly resolved to improved.  Now agitated. -- PAD protocol, changing back to propofol  from Precedex  given significant agitation with minimal stimulation on high-dose Precedex .  Hopeful can extubate after IHD  Acute hypoxemic respiratory failure due to presumed pneumonia as well as now volume overload with fluid resuscitation and DKA. -- 5-day ceftriaxone  ordered for pneumonia -- IHD Tuesday Thursday Saturday, receiving 12/27  Hypotension: Likely related to volume depletion, hypovolemia in the setting of DKA.  Now resolved. -- Continue blood pressure monitoring  Type 1 diabetes with hyperglycemia, DKA: Now anion gap closed. --Continue subcutaneous insulin , increase intensity of sliding scale to match home regimen  End-stage renal disease: --Tuesday, Thursday, Saturday dialysis  Labs   CBC: Recent Labs  Lab 04/23/24 0910 04/23/24 0953 04/23/24 1015 04/23/24 1016 04/23/24 1659 04/24/24 0750  WBC 10.4  --   --   --   --  19.5*  NEUTROABS 7.5  --   --   --   --   --   HGB 10.5* 13.3 12.9 12.6 11.6* 12.6  HCT 39.5 39.0 38.0 37.0 34.0* 36.7  MCV 100.0  --   --   --   --  78.6*  PLT 222  --   --   --   --  222    Basic Metabolic Panel: Recent Labs  Lab 04/19/24 0503 04/23/24 0910 04/23/24 0953 04/23/24 1015 04/23/24 1016 04/23/24 1659 04/23/24 1708 04/23/24 2059 04/24/24 0724  NA 137 113* 113* 114* 113* 122* 120* 127* 125*  K 4.9 >7.5* 7.6* 7.1* 7.1* 5.1 6.6* 3.5 4.5  CL 102 79* 92*  93*  --   --  87* 89* 90*  CO2 26 <7*  --   --   --   --  13* 24 21*  GLUCOSE 210* >1,200* >700* >700*  --   --  904* 287* 117*  BUN 24* 88* 106* 89*  --   --  70* 37* 39*  CREATININE 3.49* 6.79* 7.00* 7.00*  --   --  5.56* 3.23* 3.87*  CALCIUM  8.2* 8.8*  --   --   --   --  8.9 8.4* 8.2*   GFR: Estimated Creatinine Clearance: 19.3 mL/min (A) (by C-G formula based on SCr of 3.87 mg/dL (H)). Recent Labs  Lab 04/23/24 0910 04/24/24 0750 04/24/24 1216  WBC 10.4 19.5*  --   LATICACIDVEN  --   --  2.2*    Liver Function Tests: Recent Labs  Lab 04/23/24 0910  AST 59*  ALT 133*  ALKPHOS 722*  BILITOT 0.6  PROT 5.8*  ALBUMIN  3.6   No results for input(s): LIPASE, AMYLASE in the last 168 hours. No results for input(s): AMMONIA in the last 168 hours.  ABG    Component Value Date/Time   PHART 7.223 (L) 04/23/2024 1659   PCO2ART 45.4 04/23/2024 1659   PO2ART 298 (H) 04/23/2024 1659   HCO3 18.8 (L) 04/23/2024 1659   TCO2 20 (L) 04/23/2024 1659   ACIDBASEDEF 9.0 (H) 04/23/2024 1659   O2SAT 100 04/23/2024 1659     Coagulation Profile: No results for input(s): INR, PROTIME in the last 168 hours.  Cardiac Enzymes: No results for input(s): CKTOTAL, CKMB, CKMBINDEX, TROPONINI in the last 168 hours.  HbA1C: Hemoglobin A1C  Date/Time Value Ref Range Status  11/11/2013 04:31 AM 14.6 (H) 4.2 - 6.3 % Final    Comment:    The American Diabetes Association recommends that a primary goal of therapy should be <7% and that physicians should reevaluate the treatment regimen in patients with HbA1c values consistently >8%.   09/12/2011 02:53 AM SEE COMMENT 4.2 - 6.3 % Final    Comment:    The American Diabetes Association recommends that a primary goal of therapy should be <7% and that physicians should reevaluate the treatment regimen in patients with HbA1c values consistently >8%. HGB A1C - Unable to perform testing at [facility] due  - to interfering substance. HGB A1C  - H -- 16.8 %  - Reference Range: 4.8-5.6  - ----------------------------------------  - Increased risk for diabetes: 5.7-6.4  - Diabetes: >6.4  - Glycemic control for adults with  - .SABRA... diabetes: <7.0  - ----------------------------------------  - PERFORMED BY [facility] [place] [facility] SPEC.NO.: [id number]  - [address] [phone]  - ELSIE JULIANNA DROSS, MD  - [phone]    HbA1c, POC (controlled diabetic range)  Date/Time Value Ref Range Status  06/04/2018 09:29 AM   Final    Comment:    >15   Hgb A1c MFr Bld  Date/Time Value Ref Range Status  03/30/2024 03:34 AM 8.5 (H) 4.8 - 5.6 % Final    Comment:    (NOTE)  Prediabetes: 5.7 - 6.4         Diabetes: >6.4         Glycemic control for adults with diabetes: <7.0   12/20/2023 03:05 AM 10.8 (H) 4.8 - 5.6 % Final    Comment:    (NOTE) Diagnosis of Diabetes The following HbA1c ranges recommended by the American Diabetes Association (ADA) may be used as an aid in the diagnosis of diabetes mellitus.  Hemoglobin             Suggested A1C NGSP%              Diagnosis  <5.7                   Non Diabetic  5.7-6.4                Pre-Diabetic  >6.4                   Diabetic  <7.0                   Glycemic control for                       adults with diabetes.      CBG: Recent Labs  Lab 04/24/24 1932 04/24/24 2309 04/25/24 0020 04/25/24 0310 04/25/24 0741  GLUCAP 104* 64* 92 95 223*   CRITICAL CARE Performed by: Donnice SAUNDERS [place] Lasseter   Total critical care time: 32 minutes  Critical care time was exclusive of separately billable procedures and treating other patients. Critical care was necessary to treat or prevent imminent or life-threatening deterioration.  Critical care was time spent personally by me on the following activities: development of treatment plan with patient and/or surrogate as well as nursing, discussions with consultants, evaluation of patient's response to  treatment, examination of patient, obtaining history from patient or surrogate, ordering and performing treatments and interventions, ordering and review of laboratory studies, ordering and review of radiographic studies, pulse oximetry and re-evaluation of patient's condition.  Donnice SAUNDERS Beals, MD [facility] Pulmonary/Critical Care Medicine Amion for pager  04/25/2024, 8:01 AM         "

## 2024-04-25 NOTE — Progress Notes (Signed)
 Anion gap elevated, blood sugars better.  Likely starvation ketosis.  Start tube feeds in effort to improve acid-base prior to extubation to avoid tachypnea etc.  Plan to continue intubated overnight and hope for extubation in the morning.

## 2024-04-25 NOTE — Progress Notes (Signed)
 eLink Physician-Brief Progress Note Patient Name: Ashley Freeman DOB: 1990/10/19 MRN: [id number]   Date of Service  04/25/2024  HPI/Events of Note  CBG 50. BSRN is treating per hypoglycemic protocol. She was on endotool 24 hrs ago and just started on TF a few hours ago.  Received Lantus  5 units at 1115 when CBG was at 153  eICU Interventions  Will observe for now as TF at 40 cc/hr was just started 3 hours ago Discussed with BSRN to refer back to eLink if persistently hypoglycemic     Intervention Category Intermediate Interventions: Other:  Damien ONEIDA Grout 04/25/2024, 7:45 PM

## 2024-04-25 NOTE — Progress Notes (Signed)
" °   04/25/24 1202  Vitals  Temp (!) 97.5 F (36.4 C)  Pulse Rate 74  Resp 18  BP (!) 102/56  SpO2 99 %  O2 Device Ventilator  Weight 65.4 kg  Type of Weight Post-Dialysis  Oxygen Therapy  Patient Activity (if Appropriate) In bed  Pulse Oximetry Type Continuous  Oximetry Probe Site Changed No  Post Treatment  Dialyzer Clearance Clear  Liters Processed 63  Fluid Removed (mL) 1000 mL  Tolerated HD Treatment Yes  Post-Hemodialysis Comments Pt. tolerated tx well  AVG/AVF Arterial Site Held (minutes) 10 minutes  AVG/AVF Venous Site Held (minutes) 10 minutes    "

## 2024-04-25 NOTE — Progress Notes (Signed)
 [facility] NEPHROLOGY PROGRESS NOTE  Assessment/ Plan: Pt is a 33 y.o. yo female with type 1 diabetes, CHF, ESRD on HD following per dialysis management. Dialysis Orders: [facility] [address] [phone]), TTS 3:45hr, 400/800, EDW 57.5kg, 2K/2.5Ca, R AVG Heparin  bolus 1600 units with HD Calcitriol  1mcg Sensipar  60mg  with HD  TUMS 1500mg  with HD   # Seizure disorder, acute metabolic encephalopathy: Neurology is following, EEG monitoring.  Currently intubated.  # ESRD on HD: Dialysis today and has been tolerating well.    # Severe hyperkalemia presumably due to missed HD, improved with dialysis.  # Hyponatremia due to hyperglycemia and hypervolemia: Currently on insulin , UF with HD.  # Anemia: Hemoglobin above goal, no need for ESA.  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  Currently NPO.  # HTN/volume: Euvolemic, blood pressure acceptable.  Currently on sedation.   Subjective: Seen and examined in ICU.  Currently intubated, sedated.  Receiving dialysis and has been tolerating well.  Discussed with dialysis and ICU nurse.  No new event. Objective Vital signs in last 24 hours: Vitals:   04/25/24 0930 04/25/24 0945 04/25/24 1000 04/25/24 1015  BP: (!) 117/58 (!) 117/53 (!) 117/53 (!) 124/57  Pulse: 77 76 76 76  Resp: 18 18 18 18   Temp:      TempSrc:      SpO2: 98% 98% 98% 97%  Weight:       Weight change:   Intake/Output Summary (Last 24 hours) at 04/25/2024 1024 Last data filed at 04/25/2024 0900 Gross per 24 hour  Intake 1154.64 ml  Output 0 ml  Net 1154.64 ml       Labs: RENAL PANEL Recent Labs  Lab 04/23/24 0910 04/23/24 0953 04/23/24 1015 04/23/24 1016 04/23/24 1659 04/23/24 1708 04/23/24 2059 04/24/24 0724 04/25/24 0728  NA 113*   < > 114*   < > 122* 120* 127* 125* 126*  K >7.5*   < > 7.1*   < > 5.1 6.6* 3.5 4.5 4.0  CL 79*   < > 93*  --   --  87* 89* 90* 88*  CO2 <7*  --   --   --   --  13* 24 21*  19*  GLUCOSE >1,200*   < > >700*  --   --  904* 287* 117* 223*  BUN 88*   < > 89*  --   --  70* 37* 39* 45*  CREATININE 6.79*   < > 7.00*  --   --  5.56* 3.23* 3.87* 4.68*  CALCIUM  8.8*  --   --   --   --  8.9 8.4* 8.2* 8.6*  PHOS  --   --   --   --   --   --   --   --  4.4  ALBUMIN  3.6  --   --   --   --   --   --   --  2.9*   < > = values in this interval not displayed.    Liver Function Tests: Recent Labs  Lab 04/23/24 0910 04/25/24 0728  AST 59*  --   ALT 133*  --   ALKPHOS 722*  --   BILITOT 0.6  --   PROT 5.8*  --   ALBUMIN  3.6 2.9*   No results for input(s): LIPASE, AMYLASE in the last 168 hours. No results for input(s): AMMONIA in the last 168 hours. CBC: Recent Labs    11/17/23 1927 11/18/23 0630  11/18/23 0822 11/26/23 0045 04/23/24 1015 04/23/24 1016 04/23/24 1659 04/24/24 0750 04/25/24 0720  HGB 10.2* 10.9*  --    < > 12.9 12.6 11.6* 12.6 11.1*  MCV 88.0 88.4  --    < >  --   --   --  78.6* 78.4*  VITAMINB12  --  1,177*  --   --   --   --   --   --   --   FOLATE  --  12.1  --   --   --   --   --   --   --   FERRITIN  --   --  2,586*  --   --   --   --   --   --   TIBC  --   --  283  --   --   --   --   --   --   IRON   --   --  126  --   --   --   --   --   --   RETICCTPCT 2.4  --   --   --   --   --   --   --   --    < > = values in this interval not displayed.    Cardiac Enzymes: No results for input(s): CKTOTAL, CKMB, CKMBINDEX, TROPONINI in the last 168 hours. CBG: Recent Labs  Lab 04/24/24 1932 04/24/24 2309 04/25/24 0020 04/25/24 0310 04/25/24 0741  GLUCAP 104* 64* 92 95 223*    Iron  Studies: No results for input(s): IRON , TIBC, TRANSFERRIN, FERRITIN in the last 72 hours. Studies/Results: DG CHEST PORT 1 VIEW Result Date: 04/24/2024 CLINICAL DATA:  Pleural effusion. EXAM: PORTABLE CHEST 1 VIEW COMPARISON:  Radiograph yesterday FINDINGS: The endotracheal tube tip is 2.8 cm from the carina. Tip and side port of the  enteric tube below the diaphragm in the stomach. Lung volumes are low. Small bilateral pleural effusions. On the right this is likely more layering than on prior exam. Persistent right perihilar opacity, although improved from prior. Central vascular congestion. No pneumothorax. IMPRESSION: 1. Small bilateral pleural effusions. On the right this is likely more layering than on prior exam. 2. Persistent right perihilar opacity, although improved from prior. 3. Central vascular congestion. Electronically Signed   By: Andrea Gasman M.D.   On: 04/24/2024 14:13   Overnight EEG with video Result Date: 04/24/2024 Shelton Arlin KIDD, MD     04/25/2024  7:55 AM Patient Name: Ashley Freeman MRN: [id number] Epilepsy Attending: Arlin KIDD Shelton Referring Physician/Provider: everitt Clint Abbey Earle FORBES, NP Duration: 04/23/2024 1644 to 04/24/2024 1644 Patient history:  33 y.o. female who originally presented with generalized pain. She was noted to have an extremely high blood glucose and was found to be in DKA.  She then had seizure activity in the emergency department. EEG to evaluate for seizure Level of alertness:comatose AEDs during EEG study: LCM, Propofol  Technical aspects: This EEG study was done with scalp electrodes positioned according to the 10-20 International system of electrode placement. Electrical activity was reviewed with band pass filter of 1-70Hz , sensitivity of 7 uV/mm, display speed of 68mm/sec with a 60Hz  notched filter applied as appropriate. EEG data were recorded continuously and digitally stored.  Video monitoring was available and reviewed as appropriate. Description: EEG initially showed burst suppression with bursts of generalized 5 to 6 Hz theta slowing lasting 1-2 seconds admixed with 3-7 seconds of generalized suppression. Gradually  EEG evolved into near continuous generalized 3-6hz  theta-delta slowing. Hyperventilation and photic stimulation were not performed.   EKG artifact was  seen during the study ABNORMALITY - Burst suppression, generalized - Continuous slow, generalized IMPRESSION: This study is suggestive of severe to profound diffuse encephalopathy, likely related to sedation. No seizures or epileptiform discharges were seen throughout the recording. Arlin MALVA Krebs   DG Abd Portable 1V Result Date: 04/23/2024 CLINICAL DATA:  Nasogastric tube placement. EXAM: PORTABLE ABDOMEN - 1 VIEW COMPARISON:  None Available. FINDINGS: Nasogastric tube terminates in the stomach with the side port a few cm beyond the gastroesophageal junction. Surgical clips in the right upper quadrant. Abdomen is otherwise grossly unremarkable. IMPRESSION: Nasogastric tube terminates in the stomach. Chest radiograph dictated separately. Electronically Signed   By: Newell Eke M.D.   On: 04/23/2024 16:38   DG CHEST PORT 1 VIEW Result Date: 04/23/2024 CLINICAL DATA:  Nasogastric tube placement. EXAM: PORTABLE CHEST 1 VIEW COMPARISON:  04/23/2024 and CT chest 04/16/2024. FINDINGS: Endotracheal to terminates approximately 2.5 cm above the carina. Nasogastric tube is followed into the stomach with the side port likely a few cm beyond the gastroesophageal junction. Tip extends beyond the inferior margin of the image. Patient is rotated. Heart is enlarged. New right upper and right lower lobe collapse/consolidation with left perihilar and left lower lobe volume loss. Moderate right pleural effusion. No pneumothorax. IMPRESSION: 1. Satisfactory endotracheal tube placement. 2. New collapse/consolidation in the right upper and right lower lobes, possibly due to mucous plugging. 3. Moderate right pleural effusion. 4. Left perihilar and left lower lobe atelectasis. Electronically Signed   By: Newell Eke M.D.   On: 04/23/2024 16:37    Medications: Infusions:  sodium chloride      anticoagulant sodium citrate      cefTRIAXone  (ROCEPHIN )  IV Stopped (04/24/24 1724)   fentaNYL  infusion INTRAVENOUS 100  mcg/hr (04/25/24 0900)   lacosamide  (VIMPAT ) IV 100 mg (04/25/24 1005)   norepinephrine  (LEVOPHED ) Adult infusion Stopped (04/24/24 1409)   propofol  (DIPRIVAN ) infusion 20 mcg/kg/min (04/25/24 0900)    Scheduled Medications:  Chlorhexidine  Gluconate Cloth  6 each Topical Q0600   docusate  100 mg Per Tube BID   heparin   5,000 Units Subcutaneous Q8H   insulin  aspart  0-9 Units Subcutaneous TID WC   insulin  glargine  5 Units Subcutaneous Daily   mouth rinse  15 mL Mouth Rinse Q2H   pantoprazole  (PROTONIX ) IV  40 mg Intravenous Q12H   polyethylene glycol  17 g Per Tube Daily   sodium chloride  flush  3 mL Intravenous Q12H    have reviewed scheduled and prn medications.  Physical Exam: General: Ill looking young female, intubated, sedated. Heart:RRR, s1s2 nl Lungs: Coarse breath sound bilateral. Abdomen:soft,  non-distended Extremities:No edema Dialysis Access: AV graft has good thrill.   Banesa Tristan Prasad Daleyza Gadomski 04/25/2024,10:24 AM  LOS: 2 days

## 2024-04-25 NOTE — Progress Notes (Signed)
 Pt. On bed with sedated and responsive to pain. Consent on file Started with no complications  UF removal: 1L Tx duration: 3.5 hours  Access used: right AVG Access issue: Increased arterial and venous pressures. Needed to decrease BFR to 300ml/hr  Tx completed and tolerated Pressure dressing applied. Endorsed to floor nurse.   Asees Manfredi Rubi Cristofher Livecchi,RN [facility]

## 2024-04-25 NOTE — Plan of Care (Signed)
" °  Problem: Clinical Measurements: Goal: Ability to maintain clinical measurements within normal limits will improve 04/25/2024 0727 by Sharyle Lonni SQUIBB, RN Outcome: Progressing 04/25/2024 0726 by Sharyle Lonni SQUIBB, RN Outcome: Progressing Goal: Will remain free from infection Outcome: Progressing Goal: Diagnostic test results will improve 04/25/2024 0727 by Sharyle Lonni SQUIBB, RN Outcome: Progressing 04/25/2024 0726 by Sharyle Lonni SQUIBB, RN Outcome: Progressing Goal: Respiratory complications will improve 04/25/2024 0727 by Sharyle Lonni SQUIBB, RN Outcome: Progressing 04/25/2024 0726 by Sharyle Lonni SQUIBB, RN Outcome: Progressing Goal: Cardiovascular complication will be avoided 04/25/2024 0727 by Sharyle Lonni SQUIBB, RN Outcome: Progressing 04/25/2024 0726 by Sharyle Lonni SQUIBB, RN Outcome: Progressing   Problem: Activity: Goal: Risk for activity intolerance will decrease 04/25/2024 0727 by Sharyle Lonni SQUIBB, RN Outcome: Progressing 04/25/2024 0726 by Sharyle Lonni SQUIBB, RN Outcome: Progressing   Problem: Coping: Goal: Level of anxiety will decrease Outcome: Progressing   Problem: Education: Goal: Knowledge of General Education information will improve Description: Including pain rating scale, medication(s)/side effects and non-pharmacologic comfort measures 04/25/2024 0727 by Sharyle Lonni SQUIBB, RN Outcome: Not Progressing 04/25/2024 0726 by Sharyle Lonni SQUIBB, RN Outcome: Progressing   Problem: Health Behavior/Discharge Planning: Goal: Ability to manage health-related needs will improve 04/25/2024 0727 by Sharyle Lonni SQUIBB, RN Outcome: Not Progressing 04/25/2024 0726 by Sharyle Lonni SQUIBB, RN Outcome: Progressing   Problem: Nutrition: Goal: Adequate nutrition will be maintained Outcome: Not Progressing   Problem: Elimination: Goal: Will not experience complications related to bowel motility Outcome: Not  Progressing   "

## 2024-04-25 NOTE — Procedures (Signed)
 Central Venous Catheter Insertion Procedure Note  VERNELLE WISNER  [id number]  1991/01/19  Date:04/25/2024  Time:5:10 AM   Provider Performing:Maham Quintin R Stark Aguinaga   Procedure: Insertion of Non-tunneled Central Venous Catheter(36556) with US  guidance (23062)   Indication(s) Difficult access  Consent Unable to obtain consent due to emergent nature of procedure.  Anesthesia Topical only with 1% lidocaine    Timeout Verified patient identification, verified procedure, site/side was marked, verified correct patient position, special equipment/implants available, medications/allergies/relevant history reviewed, required imaging and test results available.  Sterile Technique Maximal sterile technique including full sterile barrier drape, hand hygiene, sterile gown, sterile gloves, mask, hair covering, sterile ultrasound probe cover (if used).  Procedure Description Area of catheter insertion was cleaned with chlorhexidine  and draped in sterile fashion.  With real-time ultrasound guidance a central venous catheter was placed into the right femoral vein. Nonpulsatile blood flow and easy flushing noted in all ports.  The catheter was sutured in place and sterile dressing applied.  Complications/Tolerance None; patient tolerated the procedure well. Chest X-ray is ordered to verify placement for internal jugular or subclavian cannulation.   Chest x-ray is not ordered for femoral cannulation.  EBL Minimal  Specimen(s) None   Leita SAUNDERS Maudell Stanbrough, PA-C

## 2024-04-25 NOTE — Progress Notes (Signed)
 LTM maint complete - no skin breakdown under:  Fp1,Fp2

## 2024-04-25 NOTE — Plan of Care (Signed)
" °  Problem: Education: Goal: Knowledge of General Education information will improve Description: Including pain rating scale, medication(s)/side effects and non-pharmacologic comfort measures Outcome: Progressing   Problem: Health Behavior/Discharge Planning: Goal: Ability to manage health-related needs will improve Outcome: Progressing   Problem: Clinical Measurements: Goal: Ability to maintain clinical measurements within normal limits will improve Outcome: Progressing Goal: Will remain free from infection Outcome: Progressing Goal: Diagnostic test results will improve Outcome: Progressing Goal: Respiratory complications will improve Outcome: Progressing Goal: Cardiovascular complication will be avoided Outcome: Progressing   Problem: Activity: Goal: Risk for activity intolerance will decrease Outcome: Progressing   Problem: Coping: Goal: Level of anxiety will decrease Outcome: Progressing   Problem: Nutrition: Goal: Adequate nutrition will be maintained Outcome: Not Progressing   Problem: Elimination: Goal: Will not experience complications related to bowel motility Outcome: Not Progressing   "

## 2024-04-25 NOTE — Progress Notes (Signed)
 NEUROLOGY CONSULT FOLLOW UP NOTE   Date of service: April 25, 2024 Patient Name: Ashley Freeman MRN:  [id number] DOB:  1990/08/22  Interval Hx/subjective  Currently being dialyzed at the bedside.   Vitals   Vitals:   04/25/24 0800 04/25/24 0808 04/25/24 0815 04/25/24 0818  BP: (!) 131/58 (!) 132/58 (!) 123/52   Pulse: 80 77 78 78  Resp: 18 18 18 18   Temp: 97.9 F (36.6 C)     TempSrc: Oral     SpO2: 100% 99% 99% 99%  Weight:         Body mass index is 25.93 kg/m.  Physical Exam   Constitutional: Intubated, chronically ill appearing patient  Eyes: No scleral injection or icterus.  HENT: Endotracheal tube in place Head: Normocephalic. No neck stiffness.  Respiratory: Respirations synchronous with ventilator Skin: WDI.   Neurologic Examination   Ment: Comatose in the setting of sedation with fentanyl  gtt at a rate of 100 and propofol  at a rate of 20. No eye opening to any stimuli. No limb movement.  CN: Pupils 2 mm, round and unreactive in the setting of IV sedation. No blink to threat. Absent corneal reflexes. Eyes are conjugate and at the midline with absent oculocephalic reflex. Face symmetric.  Motor/Sensory: Flaccid tone x 4. No movement to noxious stimuli.  Reflexes: Hypoactive in the context of sedation.    Medications Current Medications[1]  Labs and Diagnostic Imaging   CBC:  Recent Labs  Lab 04/23/24 0910 04/23/24 0953 04/24/24 0750 04/25/24 0720  WBC 10.4  --  19.5* 16.4*  NEUTROABS 7.5  --   --   --   HGB 10.5*   < > 12.6 11.1*  HCT 39.5   < > 36.7 33.1*  MCV 100.0  --  78.6* 78.4*  PLT 222  --  222 197   < > = values in this interval not displayed.    Basic Metabolic Panel:  Lab Results  Component Value Date   NA 125 (L) 04/24/2024   K 4.5 04/24/2024   CO2 21 (L) 04/24/2024   GLUCOSE 117 (H) 04/24/2024   BUN 39 (H) 04/24/2024   CREATININE 3.87 (H) 04/24/2024   CALCIUM  8.2 (L) 04/24/2024   GFRNONAA 15 (L) 04/24/2024    GFRAA 44 (L) 01/30/2020    HgbA1c:  Lab Results  Component Value Date   HGBA1C 8.5 (H) 03/30/2024   Urine Drug Screen:     Component Value Date/Time   LABOPIA NONE DETECTED 09/07/2022 1840   LABOPIA NONE DETECTED 04/24/2021 1032   COCAINSCRNUR NONE DETECTED 09/07/2022 1840   COCAINSCRNUR NEGATIVE 12/18/2009 1453   LABBENZ NONE DETECTED 09/07/2022 1840   LABBENZ POSITIVE (A) 04/24/2021 1032   LABBENZ NEGATIVE 12/18/2009 1453   AMPHETMU NONE DETECTED 09/07/2022 1840   AMPHETMU NONE DETECTED 04/24/2021 1032   THCU NONE DETECTED 09/07/2022 1840   THCU NONE DETECTED 04/24/2021 1032   LABBARB NONE DETECTED 09/07/2022 1840   LABBARB NONE DETECTED 04/24/2021 1032    Alcohol  Level     Component Value Date/Time   [facility] <15 03/22/2024 0331   INR  Lab Results  Component Value Date   INR 1.5 (H) 04/06/2024   APTT  Lab Results  Component Value Date   APTT 26 04/24/2021   AED levels:  Lab Results  Component Value Date   LAMOTRIGINE  <1.0 (L) 02/20/2024   CT head without contrast 04/13/2024: No acute intracranial abnormality. Skull base atherosclerosis greater than expected for age. There is diffuse  stranding of the facial subcutaneous fat bilaterally which could reflect edema/anasarca.    Assessment  Ashley Freeman is a 33 y.o. female with a PMHx of poorly controlled DM Type 1, seizures, Bipolar Disorder, ESRD on HD, CHFrEF, HTN, HLD and poor medication compliance who originally presented with generalized pain.  She was noted to have an extremely high blood glucose and was found to be in DKA.  She then had some seizure activity in the emergency department, vomited, aspirated and was intubated for airway protection.  She has had 2 recent hospitalizations this month for seizure and DKA.  Patient was in the process of titrating up Lamictal  to a therapeutic dose and was then supposed to stop Keppra . Her Lamictal  level was < 1.0, consistent with non-compliance. It is unclear  why this medication change was being made and patient unfortunately cannot give history right now regarding possible side effects from Keppra .  Given compliance issues, would be concerned about patient stopping therapeutic Lamictal  and then abruptly starting it again, leading to increased risk of Stevens-Johnson syndrome. Therefore, Vimpat  was started for seizure control.  - Exam today reveals a comatose/heavily sedated patient on fentanyl  at a rate of 100 and propofol  at at rate of 20. No clinical seizure activity appreciated.  - LTM EEG report for this morning: Continuous slow, generalized. This study is suggestive of severe diffuse encephalopathy. No seizures or epileptiform discharges were seen throughout the recording. - Impression: Epilepsy with breakthrough seizures due to severe hyperglycemia in conjunction with AED noncompliance  Recommendations  - Continue Vimpat  100 mg twice daily - Reduce propofol  to 10 and in 1 hour okay to stop as no seizures on LTM. She is currently on propofol  at a rate of 20.  - Will continue LTM EEG today and likely DC tomorrow if no seizure recurrence off propofol  - Okay to use Precedex  instead of propofol  for agitation/sedation if needed - Management of other metabolic abnormalities per primary team - Will need to counsel patient about compliance when she is more awake - Discussed plan with ICU team via secure chat  CRITICAL CARE Performed by: MERRIANNE, Shaquan Puerta  Total critical care time: 35 minutes  Critical care time was exclusive of separately billable procedures and treating other patients.  Critical care was necessary to treat or prevent imminent or life-threatening deterioration.  Critical care was time spent personally by me on the following activities: development of treatment plan with patient and/or surrogate as well as nursing, discussions with consultants, evaluation of patient's response to treatment, examination of patient, obtaining history from  patient or surrogate, ordering and performing treatments and interventions, ordering and review of laboratory studies, ordering and review of radiographic studies, pulse oximetry and re-evaluation of patient's condition.  ______________________________________________________________________   Bonney MERRIANNE, Leverett Camplin, MD [facility]    [1]  Current Facility-Administered Medications:    0.9 %  sodium chloride  infusion, 250 mL, Intravenous, Continuous, Bowser, Ronnald BRAVO, NP   acetaminophen  (TYLENOL ) tablet 650 mg, 650 mg, Oral, Q6H PRN **OR** acetaminophen  (TYLENOL ) suppository 650 mg, 650 mg, Rectal, Q6H PRN, Claudene, Rondell A, MD   albuterol  (PROVENTIL ) (2.5 MG/3ML) 0.083% nebulizer solution 2.5 mg, 2.5 mg, Nebulization, Q6H PRN, Claudene, Rondell A, MD, 2.5 mg at 04/25/24 0529   alteplase  (CATHFLO ACTIVASE ) injection 2 mg, 2 mg, Intracatheter, Once PRN, Bhandari, Dron Prasad, MD   alteplase  (CATHFLO ACTIVASE ) injection 2 mg, 2 mg, Intracatheter, Once PRN, Bhandari, Dron Prasad, MD   anticoagulant sodium citrate  solution 5 mL, 5 mL, Intracatheter,  PRN, Bhandari, Dron Prasad, MD   anticoagulant sodium citrate  solution 5 mL, 5 mL, Intracatheter, PRN, Bhandari, Dron Prasad, MD   cefTRIAXone  (ROCEPHIN ) 2 g in sodium chloride  0.9 % 100 mL IVPB, 2 g, Intravenous, Q24H, Hunsucker, Donnice SAUNDERS, MD, Stopped at 04/24/24 1724   Chlorhexidine  Gluconate Cloth 2 % PADS 6 each, 6 each, Topical, Q0600, Bhandari, Dron Prasad, MD, 6 each at 04/24/24 0533   Chlorhexidine  Gluconate Cloth 2 % PADS 6 each, 6 each, Topical, Q0600, Bhandari, Dron Prasad, MD, 6 each at 04/25/24 0559   dextrose  50 % solution 0-50 mL, 0-50 mL, Intravenous, PRN, Neysa Caron PARAS, DO, 25 mL at 04/24/24 2326   docusate (COLACE) 50 MG/5ML liquid 100 mg, 100 mg, Per Tube, BID, Kassie Acquanetta Bradley, MD, 100 mg at 04/24/24 0930   fentaNYL  (SUBLIMAZE ) bolus via infusion 25-100 mcg, 25-100 mcg, Intravenous, Q15 min PRN, Mannam, Praveen, MD, 100  mcg at 04/25/24 0754   fentaNYL  in NS (63mcg/ml) infusion-PREMIX, 0-400 mcg/hr, Intravenous, Continuous, Mannam, Praveen, MD, Last Rate: 10 mL/hr at 04/25/24 0809, 100 mcg/hr at 04/25/24 0809   heparin  injection 1,000 Units, 1,000 Units, Intracatheter, PRN, Bhandari, Dron Prasad, MD   heparin  injection 1,000 Units, 1,000 Units, Intracatheter, PRN, Bhandari, Dron Prasad, MD   heparin  injection 1,300 Units, 20 Units/kg, Dialysis, PRN, Bhandari, Dron Prasad, MD   heparin  injection 5,000 Units, 5,000 Units, Subcutaneous, Q8H, Claudene Reeves A, MD, 5,000 Units at 04/25/24 0559   hydrALAZINE  (APRESOLINE ) injection 10 mg, 10 mg, Intravenous, Q6H PRN, Kamat, Sunil G, MD, 10 mg at 04/25/24 9660   insulin  aspart (novoLOG ) injection 0-9 Units, 0-9 Units, Subcutaneous, TID WC, Hunsucker, Donnice SAUNDERS, MD   insulin  glargine (LANTUS ) injection 5 Units, 5 Units, Subcutaneous, Daily, Tanda Powell ORN, RPH, 5 Units at 04/24/24 1122   lacosamide  (VIMPAT ) 100 mg in sodium chloride  0.9 % 25 mL IVPB, 100 mg, Intravenous, Q12H, de Clint Kill, Woodland E, NP, Stopped at 04/24/24 1202   lidocaine  (PF) (XYLOCAINE ) 1 % injection 5 mL, 5 mL, Intradermal, PRN, Dolan Mateo Larger, MD   lidocaine  (PF) (XYLOCAINE ) 1 % injection 5 mL, 5 mL, Intradermal, PRN, Bhandari, Dron Prasad, MD   lidocaine -prilocaine  (EMLA ) cream 1 Application, 1 Application, Topical, PRN, Bhandari, Dron Prasad, MD   lidocaine -prilocaine  (EMLA ) cream 1 Application, 1 Application, Topical, PRN, Bhandari, Dron Prasad, MD   midazolam  PF (VERSED ) injection 2 mg, 2 mg, Intravenous, Q4H PRN, Hunsucker, Donnice SAUNDERS, MD   norepinephrine  (LEVOPHED ) 4mg  in (0.016 mg/mL) premix infusion, 0-10 mcg/min, Intravenous, Titrated, Bowser, Ronnald BRAVO, NP, Stopped at 04/24/24 1409   Oral care mouth rinse, 15 mL, Mouth Rinse, Q2H, Kassie Acquanetta Bradley, MD, 15 mL at 04/25/24 0746   Oral care mouth rinse, 15 mL, Mouth Rinse, PRN, Kassie Acquanetta Bradley, MD   pantoprazole   (PROTONIX ) injection 40 mg, 40 mg, Intravenous, Q12H, Kassie Acquanetta Bradley, MD, 40 mg at 04/24/24 2211   pentafluoroprop-tetrafluoroeth (GEBAUERS) aerosol 1 Application, 1 Application, Topical, PRN, Bhandari, Dron Prasad, MD   pentafluoroprop-tetrafluoroeth (GEBAUERS) aerosol 1 Application, 1 Application, Topical, PRN, Bhandari, Dron Prasad, MD   polyethylene glycol (MIRALAX  / GLYCOLAX ) packet 17 g, 17 g, Per Tube, Daily, Kassie Acquanetta Bradley, MD   propofol  (DIPRIVAN ) 1000 MG/100ML infusion, 0-40 mcg/kg/min, Intravenous, Titrated, Hunsucker, Donnice SAUNDERS, MD, Last Rate: 7.97 mL/hr at 04/25/24 0809, 20 mcg/kg/min at 04/25/24 0809   sodium chloride  flush (NS) 0.9 % injection 3 mL, 3 mL, Intravenous, Q12H, Smith, Rondell A, MD, 3 mL at 04/24/24 2211  trimethobenzamide  (TIGAN ) injection 200 mg, 200 mg, Intramuscular, Q6H PRN, Claudene Maximino LABOR, MD

## 2024-04-26 ENCOUNTER — Inpatient Hospital Stay (HOSPITAL_COMMUNITY)

## 2024-04-26 DIAGNOSIS — Z992 Dependence on renal dialysis: Secondary | ICD-10-CM | POA: Diagnosis not present

## 2024-04-26 DIAGNOSIS — G40909 Epilepsy, unspecified, not intractable, without status epilepticus: Secondary | ICD-10-CM | POA: Diagnosis not present

## 2024-04-26 DIAGNOSIS — E1022 Type 1 diabetes mellitus with diabetic chronic kidney disease: Secondary | ICD-10-CM | POA: Diagnosis not present

## 2024-04-26 DIAGNOSIS — I429 Cardiomyopathy, unspecified: Secondary | ICD-10-CM

## 2024-04-26 DIAGNOSIS — I5022 Chronic systolic (congestive) heart failure: Secondary | ICD-10-CM | POA: Diagnosis not present

## 2024-04-26 DIAGNOSIS — N186 End stage renal disease: Secondary | ICD-10-CM | POA: Diagnosis not present

## 2024-04-26 DIAGNOSIS — E1010 Type 1 diabetes mellitus with ketoacidosis without coma: Secondary | ICD-10-CM | POA: Diagnosis not present

## 2024-04-26 DIAGNOSIS — I132 Hypertensive heart and chronic kidney disease with heart failure and with stage 5 chronic kidney disease, or end stage renal disease: Secondary | ICD-10-CM | POA: Diagnosis not present

## 2024-04-26 DIAGNOSIS — Z91148 Patient's other noncompliance with medication regimen for other reason: Secondary | ICD-10-CM | POA: Diagnosis not present

## 2024-04-26 LAB — BASIC METABOLIC PANEL WITH GFR
Anion gap: 12 (ref 5–15)
Anion gap: 15 (ref 5–15)
BUN: 28 mg/dL — ABNORMAL HIGH (ref 6–20)
BUN: 30 mg/dL — ABNORMAL HIGH (ref 6–20)
CO2: 26 mmol/L (ref 22–32)
CO2: 26 mmol/L (ref 22–32)
Calcium: 7.9 mg/dL — ABNORMAL LOW (ref 8.9–10.3)
Calcium: 8.5 mg/dL — ABNORMAL LOW (ref 8.9–10.3)
Chloride: 94 mmol/L — ABNORMAL LOW (ref 98–111)
Chloride: 93 mmol/L — ABNORMAL LOW (ref 98–111)
Creatinine, Ser: 3.5 mg/dL — ABNORMAL HIGH (ref 0.44–1.00)
Creatinine, Ser: 3.92 mg/dL — ABNORMAL HIGH (ref 0.44–1.00)
GFR, Estimated: 17 mL/min — ABNORMAL LOW
GFR, Estimated: 15 mL/min — ABNORMAL LOW
Glucose, Bld: 129 mg/dL — ABNORMAL HIGH (ref 70–99)
Glucose, Bld: 85 mg/dL (ref 70–99)
Potassium: 3.7 mmol/L (ref 3.5–5.1)
Potassium: 3.6 mmol/L (ref 3.5–5.1)
Sodium: 132 mmol/L — ABNORMAL LOW (ref 135–145)
Sodium: 133 mmol/L — ABNORMAL LOW (ref 135–145)

## 2024-04-26 LAB — GLUCOSE, CAPILLARY
Glucose-Capillary: 115 mg/dL — ABNORMAL HIGH (ref 70–99)
Glucose-Capillary: 191 mg/dL — ABNORMAL HIGH (ref 70–99)
Glucose-Capillary: 187 mg/dL — ABNORMAL HIGH (ref 70–99)
Glucose-Capillary: 124 mg/dL — ABNORMAL HIGH (ref 70–99)
Glucose-Capillary: 59 mg/dL — ABNORMAL LOW (ref 70–99)
Glucose-Capillary: 72 mg/dL (ref 70–99)

## 2024-04-26 LAB — PHOSPHORUS: Phosphorus: 4 mg/dL (ref 2.5–4.6)

## 2024-04-26 LAB — CULTURE, RESPIRATORY W GRAM STAIN

## 2024-04-26 LAB — MAGNESIUM: Magnesium: 2 mg/dL (ref 1.7–2.4)

## 2024-04-26 LAB — TRIGLYCERIDES: Triglycerides: 303 mg/dL — ABNORMAL HIGH

## 2024-04-26 MED ORDER — NEPRO/CARBSTEADY PO LIQD
237.0000 mL | Freq: Two times a day (BID) | ORAL | Status: DC
  Administered 2024-04-27 – 2024-04-28 (×2): 237 mL via ORAL

## 2024-04-26 MED ORDER — LACOSAMIDE 50 MG PO TABS: 100.0000 mg | ORAL_TABLET | Freq: Two times a day (BID) | ORAL | Status: DC

## 2024-04-26 MED ORDER — DOCUSATE SODIUM 100 MG PO CAPS: 100.0000 mg | ORAL_CAPSULE | Freq: Two times a day (BID) | ORAL | Status: DC

## 2024-04-26 MED ORDER — POLYETHYLENE GLYCOL 3350 17 G PO PACK: 17.0000 g | PACK | Freq: Every day | ORAL | Status: DC

## 2024-04-26 MED ORDER — CARVEDILOL 25 MG PO TABS
25.0000 mg | ORAL_TABLET | Freq: Two times a day (BID) | ORAL | Status: DC
  Administered 2024-04-26 – 2024-04-28 (×5): 25 mg via ORAL
  Filled 2024-04-26 (×2): qty 2

## 2024-04-26 MED ORDER — PROSOURCE PLUS PO LIQD
30.0000 mL | Freq: Two times a day (BID) | ORAL | Status: DC
  Administered 2024-04-27 – 2024-04-28 (×3): 30 mL via ORAL
  Filled 2024-04-26 (×2): qty 30

## 2024-04-26 MED ORDER — ONDANSETRON HCL 4 MG/2ML IJ SOLN
4.0000 mg | INTRAMUSCULAR | Status: DC | PRN
  Administered 2024-04-26 (×2): 4 mg via INTRAVENOUS
  Filled 2024-04-26 (×2): qty 2

## 2024-04-26 MED ORDER — HYDROXYZINE HCL 25 MG PO TABS
25.0000 mg | ORAL_TABLET | Freq: Three times a day (TID) | ORAL | Status: DC | PRN
  Administered 2024-04-26 – 2024-04-27 (×4): 25 mg via ORAL
  Filled 2024-04-26 (×2): qty 1

## 2024-04-26 MED ORDER — ORAL CARE MOUTH RINSE: 15.0000 mL | OROMUCOSAL | Status: DC | PRN

## 2024-04-26 MED ORDER — FENTANYL CITRATE (PF) 50 MCG/ML IJ SOSY
50.0000 ug | PREFILLED_SYRINGE | Freq: Once | INTRAMUSCULAR | Status: AC
  Administered 2024-04-26: 50 ug via INTRAVENOUS
  Filled 2024-04-26: qty 1

## 2024-04-26 MED ORDER — LACOSAMIDE 50 MG PO TABS
100.0000 mg | ORAL_TABLET | Freq: Two times a day (BID) | ORAL | Status: DC
  Administered 2024-04-26 – 2024-04-27 (×3): 100 mg via ORAL
  Filled 2024-04-26: qty 2

## 2024-04-26 MED ORDER — RENA-VITE PO TABS
1.0000 | ORAL_TABLET | Freq: Every day | ORAL | Status: DC
  Administered 2024-04-26 – 2024-04-27 (×2): 1 via ORAL
  Filled 2024-04-26: qty 1

## 2024-04-26 MED ORDER — SACUBITRIL-VALSARTAN 24-26 MG PO TABS
1.0000 | ORAL_TABLET | Freq: Two times a day (BID) | ORAL | Status: DC
  Administered 2024-04-26 – 2024-04-27 (×4): 1 via ORAL
  Filled 2024-04-26 (×2): qty 1

## 2024-04-26 MED ORDER — OXYCODONE HCL 5 MG PO TABS
5.0000 mg | ORAL_TABLET | Freq: Four times a day (QID) | ORAL | Status: DC | PRN
  Administered 2024-04-26 – 2024-04-28 (×4): 5 mg via ORAL
  Filled 2024-04-26 (×2): qty 1

## 2024-04-26 MED ORDER — ORAL CARE MOUTH RINSE
15.0000 mL | OROMUCOSAL | Status: DC
  Administered 2024-04-27 – 2024-04-28 (×4): 15 mL via OROMUCOSAL

## 2024-04-26 NOTE — Progress Notes (Signed)
 Wean sedation.  Agitated as she has been in the past with less sedation.  Respirate 16 with tidal volume over a liter.  On pressure support 8/5.  Recommended extubation given her agitation and good lung performance based on lung volumes.  RT was contacted.  She was unavailable to proceed with imminent extubation.  Given severe agitation demonstrated by the patient and inability to wait, this writer performed extubation.  She was placed on nasal cannula.  In no immediate distress.  Able to verbally communicate.

## 2024-04-26 NOTE — Progress Notes (Signed)
 Ashley Freeman  Assessment/ Plan: Pt is a 33 y.o. yo female with type 1 diabetes, CHF, ESRD on HD following per dialysis management. Dialysis Orders: [facility] [address] [phone]), TTS 3:45hr, 400/800, EDW 57.5kg, 2K/2.5Ca, R AVG Heparin  bolus 1600 units with HD Calcitriol  1mcg Sensipar  60mg  with HD  TUMS 1500mg  with HD   # Seizure disorder, acute metabolic encephalopathy: Neurology is following, EEG monitoring.  AED per neurology.  # ESRD on HD: Dialysis yesterday with around 1 L UF.  The patient is extubated.  Continue TTS schedule.  # Severe hyperkalemia presumably due to missed HD, improved with dialysis.  # Hyponatremia due to hyperglycemia and hypervolemia: on insulin , UF with HD.  # Anemia: Hemoglobin at goal, no need for ESA.  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  Currently NPO.  # HTN/volume: Euvolemic, blood pressure mostly acceptable however elevated due to agitation.  Currently on sedation.   Subjective: Seen and examined in ICU.  She is extubated.  Tolerated dialysis well yesterday.  Currently on nasal cannula.  Remains confused and somnolent. Objective Vital signs in last 24 hours: Vitals:   04/26/24 0941 04/26/24 0945 04/26/24 1030 04/26/24 1100  BP: (!) 196/72 (!) 196/72 (!) 157/41 (!) 157/60  Pulse: (!) 114  (!) 127 (!) 120  Resp: 16  17   Temp:      TempSrc:      SpO2: 100%  92% 93%  Weight:       Weight change:   Intake/Output Summary (Last 24 hours) at 04/26/2024 1135 Last data filed at 04/26/2024 0900 Gross per 24 hour  Intake 1412.64 ml  Output 1170 ml  Net 242.64 ml       Labs: RENAL PANEL Recent Labs  Lab 04/23/24 0910 04/23/24 0953 04/23/24 1708 04/23/24 2059 04/24/24 0724 04/25/24 0728 04/26/24 0429  NA 113*   < > 120* 127* 125* 126* 132*  K >7.5*   < > 6.6* 3.5 4.5 4.0 3.7  CL 79*   < > 87* 89* 90* 88* 94*  CO2 <7*  --  13* 24 21* 19* 26  GLUCOSE  >1,200*   < > 904* 287* 117* 223* 129*  BUN 88*   < > 70* 37* 39* 45* 28*  CREATININE 6.79*   < > 5.56* 3.23* 3.87* 4.68* 3.50*  CALCIUM  8.8*  --  8.9 8.4* 8.2* 8.6* 7.9*  MG  --   --   --   --   --  1.8 2.0  PHOS  --   --   --   --   --  4.5  4.4 4.0  ALBUMIN  3.6  --   --   --   --  2.9*  --    < > = values in this interval not displayed.    Liver Function Tests: Recent Labs  Lab 04/23/24 0910 04/25/24 0728  AST 59*  --   ALT 133*  --   ALKPHOS 722*  --   BILITOT 0.6  --   PROT 5.8*  --   ALBUMIN  3.6 2.9*   No results for input(s): LIPASE, AMYLASE in the last 168 hours. No results for input(s): AMMONIA in the last 168 hours. CBC: Recent Labs    11/17/23 1927 11/18/23 0630 11/18/23 0822 11/26/23 0045 04/23/24 1015 04/23/24 1016 04/23/24 1659 04/24/24 0750 04/25/24 0720  HGB 10.2* 10.9*  --    < > 12.9 12.6 11.6* 12.6 11.1*  MCV 88.0 88.4  --    < >  --   --   --  78.6* 78.4*  VITAMINB12  --  1,177*  --   --   --   --   --   --   --   FOLATE  --  12.1  --   --   --   --   --   --   --   FERRITIN  --   --  2,586*  --   --   --   --   --   --   TIBC  --   --  283  --   --   --   --   --   --   IRON   --   --  126  --   --   --   --   --   --   RETICCTPCT 2.4  --   --   --   --   --   --   --   --    < > = values in this interval not displayed.    Cardiac Enzymes: No results for input(s): CKTOTAL, CKMB, CKMBINDEX, TROPONINI in the last 168 hours. CBG: Recent Labs  Lab 04/25/24 1932 04/25/24 2004 04/25/24 2314 04/26/24 0309 04/26/24 0732  GLUCAP 51* 138* 108* 115* 191*    Iron  Studies: No results for input(s): IRON , TIBC, TRANSFERRIN, FERRITIN in the last 72 hours. Studies/Results: No results found.   Medications: Infusions:  anticoagulant sodium citrate      cefTRIAXone  (ROCEPHIN )  IV Stopped (04/25/24 1805)   lacosamide  (VIMPAT ) IV 100 mg (04/26/24 0950)    Scheduled Medications:  (feeding supplement) PROSource Plus  30 mL Oral  BID BM   carvedilol   25 mg Oral BID WC   Chlorhexidine  Gluconate Cloth  6 each Topical Q0600   docusate sodium   100 mg Oral BID   fentaNYL  (SUBLIMAZE ) injection  50 mcg Intravenous Once   heparin   5,000 Units Subcutaneous Q8H   insulin  aspart  0-9 Units Subcutaneous TID WC   insulin  glargine  5 Units Subcutaneous Daily   lacosamide   100 mg Oral BID   mouth rinse  15 mL Mouth Rinse Q2H   pantoprazole  (PROTONIX ) IV  40 mg Intravenous Q12H   polyethylene glycol  17 g Oral Daily   sacubitril -valsartan   1 tablet Oral BID   sodium chloride  flush  3 mL Intravenous Q12H    have reviewed scheduled and prn medications.  Physical Exam: General: somnolent female, extubated.  Not in distress Heart:RRR, s1s2 nl Lungs: Clear bilateral.. Abdomen:soft,  non-distended Extremities:No edema Dialysis Access: AV graft has good thrill.   Ashley Freeman 04/26/2024,11:35 AM  LOS: 3 days

## 2024-04-26 NOTE — Progress Notes (Signed)
 MD had placed order for extubation. RN called Bari, RT to come extubate and she was finishing up on another unit and then on the way. Patient very agitated, HR 140s and vomited. RN called Dr. Annella to the room and MD proceeded to extubate patient with assistance from this RN and Cyndee, RN. Patient drowsy, talking with hoarse voice, following commands but restless and agitated attempting to pull at EEG leads and central line stating she itches. RN remaining at bedside. Reoriention provided to patient.

## 2024-04-26 NOTE — Progress Notes (Signed)
 NEUROLOGY CONSULT FOLLOW UP NOTE   Date of service: April 26, 2024 Patient Name: Ashley Freeman MRN:  [id number] DOB:  22-Jul-1990  Interval Hx/subjective  The patient has been extubated, and has ripped all of her EEG leads off.   Vitals   Vitals:   04/26/24 0945 04/26/24 1030 04/26/24 1100 04/26/24 1130  BP: (!) 196/72 (!) 157/41 (!) 157/60 (!) 161/110  Pulse:  (!) 127 (!) 120   Resp:  17  13  Temp:      TempSrc:      SpO2:  92% 93%   Weight:         Body mass index is 25.7 kg/m.  Physical Exam   Constitutional: Intubated, chronically ill appearing patient  Eyes: No scleral injection or icterus.  HENT: Endotracheal tube in place Head: Normocephalic. No neck stiffness.  Respiratory: Respirations synchronous with ventilator Skin: WDI.   Neurologic Examination   Mental Status: Awake and alert. Speech is sparse and she is poorly cooperative. Poor insight regarding her present situation and medical conditions. Does not correctly answer orientation questions. Akathisia noted - squirming in her hospital bed.  Cranial Nerves: II: Does not make eye contact. Poorly cooperative.    III,IV, VI: No ptosis. EOMI. No nystagmus.  VII: Face is symmetric during speech VIII: Hearing intact to questions and commands.  IX,X: No hoarseness or hypophonia XI: Symmetric Motor: Does not cooperate well with exam. Moves all 4 extremities equally while squirming in her hospital bed.  Sensory: Reacts to touch and light pinch x 4 Deep Tendon Reflexes: Hypoactive reflexes Cerebellar: Does not cooperate with coordination testing Gait: Deferred  Medications Current Medications[1]  Labs and Diagnostic Imaging   CBC:  Recent Labs  Lab 04/23/24 0910 04/23/24 0953 04/24/24 0750 04/25/24 0720  WBC 10.4  --  19.5* 16.4*  NEUTROABS 7.5  --   --   --   HGB 10.5*   < > 12.6 11.1*  HCT 39.5   < > 36.7 33.1*  MCV 100.0  --  78.6* 78.4*  PLT 222  --  222 197   < > = values in  this interval not displayed.    Basic Metabolic Panel:  Lab Results  Component Value Date   NA 132 (L) 04/26/2024   K 3.7 04/26/2024   CO2 26 04/26/2024   GLUCOSE 129 (H) 04/26/2024   BUN 28 (H) 04/26/2024   CREATININE 3.50 (H) 04/26/2024   CALCIUM  7.9 (L) 04/26/2024   GFRNONAA 17 (L) 04/26/2024   GFRAA 44 (L) 01/30/2020   Lipid Panel:  Lab Results  Component Value Date   LDLCALC 149 (H) 05/24/2021   HgbA1c:  Lab Results  Component Value Date   HGBA1C 8.5 (H) 03/30/2024   Urine Drug Screen:     Component Value Date/Time   LABOPIA NONE DETECTED 09/07/2022 1840   LABOPIA NONE DETECTED 04/24/2021 1032   COCAINSCRNUR NONE DETECTED 09/07/2022 1840   COCAINSCRNUR NEGATIVE 12/18/2009 1453   LABBENZ NONE DETECTED 09/07/2022 1840   LABBENZ POSITIVE (A) 04/24/2021 1032   LABBENZ NEGATIVE 12/18/2009 1453   AMPHETMU NONE DETECTED 09/07/2022 1840   AMPHETMU NONE DETECTED 04/24/2021 1032   THCU NONE DETECTED 09/07/2022 1840   THCU NONE DETECTED 04/24/2021 1032   LABBARB NONE DETECTED 09/07/2022 1840   LABBARB NONE DETECTED 04/24/2021 1032    Alcohol  Level     Component Value Date/Time   [facility] <15 03/22/2024 0331   INR  Lab Results  Component Value Date  INR 1.5 (H) 04/06/2024   APTT  Lab Results  Component Value Date   APTT 26 04/24/2021   AED levels:  Lab Results  Component Value Date   LAMOTRIGINE  <1.0 (L) 02/20/2024   CT head without contrast 04/13/2024: No acute intracranial abnormality. Skull base atherosclerosis greater than expected for age. There is diffuse stranding of the facial subcutaneous fat bilaterally which could reflect edema/anasarca.     Assessment  Ashley Freeman is a 33 y.o. female with a PMHx of poorly controlled DM Type 1, seizures, Bipolar Disorder, ESRD on HD, CHFrEF, HTN, HLD and poor medication compliance who originally presented with generalized pain.  She was noted to have an extremely high blood glucose and was found to  be in DKA.  She then had some seizure activity in the emergency department, vomited, aspirated and was intubated for airway protection.  She has had 2 recent hospitalizations this month for seizure and DKA.  Patient was in the process of titrating up Lamictal  to a therapeutic dose and was then supposed to stop Keppra . Her Lamictal  level was < 1.0, consistent with non-compliance. Given compliance issues, would be concerned about patient stopping therapeutic Lamictal  and then abruptly starting it again, leading to increased risk of Stevens-Johnson syndrome. Therefore, Vimpat  was started for seizure control.  - Exam today reveals patient now awake with akathisia and squirming in her hospital bed. She verbalizes some concerns but is poorly oriented and poorly cooperative.   - LTM EEG report for this morning:  Continuous slow, generalized. This study is suggestive of severe diffuse encephalopathy. No seizures or epileptiform discharges were seen throughout the recording. - Impression: Epilepsy with breakthrough seizures due to severe hyperglycemia in conjunction with AED noncompliance  Recommendations  - Continue Vimpat  100 mg twice daily - Discontinuing LTM EEG - Management of other metabolic abnormalities per primary team - Will need to counsel patient about compliance when she is more cooperative   ______________________________________________________________________   Ashley Freeman, Peyton Spengler, MD [facility]     [1]  Current Facility-Administered Medications:    (feeding supplement) PROSource Plus liquid 30 mL, 30 mL, Oral, BID BM, Laurence, [name] D, RPH   acetaminophen  (TYLENOL ) tablet 650 mg, 650 mg, Oral, Q6H PRN **OR** acetaminophen  (TYLENOL ) suppository 650 mg, 650 mg, Rectal, Q6H PRN, Claudene, Rondell A, MD   albuterol  (PROVENTIL ) (2.5 MG/3ML) 0.083% nebulizer solution 2.5 mg, 2.5 mg, Nebulization, Q6H PRN, Claudene, Rondell A, MD, 2.5 mg at 04/25/24 0529   alteplase  (CATHFLO ACTIVASE )  injection 2 mg, 2 mg, Intracatheter, Once PRN, Bhandari, Dron Prasad, MD   anticoagulant sodium citrate  solution 5 mL, 5 mL, Intracatheter, PRN, Bhandari, Dron Prasad, MD   carvedilol  (COREG ) tablet 25 mg, 25 mg, Oral, BID WC, Hunsucker, Donnice SAUNDERS, MD   cefTRIAXone  (ROCEPHIN ) 2 g in sodium chloride  0.9 % 100 mL IVPB, 2 g, Intravenous, Q24H, Hunsucker, Donnice SAUNDERS, MD, Stopped at 04/25/24 1805   Chlorhexidine  Gluconate Cloth 2 % PADS 6 each, 6 each, Topical, Q0600, Bhandari, Dron Prasad, MD, 6 each at 04/26/24 0545   dextrose  50 % solution 0-50 mL, 0-50 mL, Intravenous, PRN, Young, Travis J, DO, 25 mL at 04/24/24 2326   docusate sodium  (COLACE) capsule 100 mg, 100 mg, Oral, BID, Chen, [name] D, [facility]   fentaNYL  (SUBLIMAZE ) injection 50 mcg, 50 mcg, Intravenous, Once, Hunsucker, Donnice SAUNDERS, MD   heparin  injection 1,000 Units, 1,000 Units, Intracatheter, PRN, Dolan Mateo Larger, MD   heparin  injection 1,300 Units, 20 Units/kg, Dialysis, PRN, Bhandari, Dron  Amelie, MD   heparin  injection 5,000 Units, 5,000 Units, Subcutaneous, Q8H, Claudene Reeves A, MD, 5,000 Units at 04/26/24 0546   hydrALAZINE  (APRESOLINE ) injection 10 mg, 10 mg, Intravenous, Q6H PRN, Kamat, Sunil G, MD, 10 mg at 04/26/24 0945   insulin  aspart (novoLOG ) injection 0-9 Units, 0-9 Units, Subcutaneous, TID WC, Hunsucker, Donnice SAUNDERS, MD, 2 Units at 04/26/24 0805   insulin  glargine (LANTUS ) injection 5 Units, 5 Units, Subcutaneous, Daily, Tanda Powell ORN, RPH, 5 Units at 04/26/24 0957   lacosamide  (VIMPAT ) 100 mg in sodium chloride  0.9 % 25 mL IVPB, 100 mg, Intravenous, Q12H, Shelton Arlin KIDD, MD, Last Rate: 70 mL/hr at 04/26/24 0950, 100 mg at 04/26/24 0950   lacosamide  (VIMPAT ) tablet 100 mg, 100 mg, Oral, BID, Chen, [name] D, [facility]   lidocaine  (PF) (XYLOCAINE ) 1 % injection 5 mL, 5 mL, Intradermal, PRN, Bhandari, Dron Prasad, MD   lidocaine -prilocaine  (EMLA ) cream 1 Application, 1 Application, Topical, PRN, Bhandari, Dron Prasad, MD    ondansetron  (ZOFRAN ) injection 4 mg, 4 mg, Intravenous, Q4H PRN, Hunsucker, Donnice SAUNDERS, MD, 4 mg at 04/26/24 1008   Oral care mouth rinse, 15 mL, Mouth Rinse, Q2H, Kassie Acquanetta Bradley, MD, 15 mL at 04/26/24 0950   Oral care mouth rinse, 15 mL, Mouth Rinse, PRN, Kassie Acquanetta Bradley, MD   oxyCODONE  (Oxy IR/ROXICODONE ) immediate release tablet 5 mg, 5 mg, Oral, Q6H PRN, Hunsucker, Donnice SAUNDERS, MD   pantoprazole  (PROTONIX ) injection 40 mg, 40 mg, Intravenous, Q12H, Kassie Acquanetta Bradley, MD, 40 mg at 04/26/24 0945   pentafluoroprop-tetrafluoroeth (GEBAUERS) aerosol 1 Application, 1 Application, Topical, PRN, Bhandari, Dron Prasad, MD   polyethylene glycol (MIRALAX  / GLYCOLAX ) packet 17 g, 17 g, Oral, Daily, Chen, [name] D, RPH   sacubitril -valsartan  (ENTRESTO ) 24-26 mg per tablet, 1 tablet, Oral, BID, Hunsucker, Donnice SAUNDERS, MD   sodium chloride  flush (NS) 0.9 % injection 3 mL, 3 mL, Intravenous, Q12H, Smith, Rondell A, MD, 3 mL at 04/25/24 2136   trimethobenzamide  (TIGAN ) injection 200 mg, 200 mg, Intramuscular, Q6H PRN, Claudene Reeves LABOR, MD

## 2024-04-26 NOTE — Progress Notes (Signed)
 "  NAME:  Ashley Freeman, MRN:  [id number], DOB:  1991/02/20, LOS: 3 ADMISSION DATE:  04/23/2024, CONSULTATION DATE:  04/23/24 REFERRING MD:  Maximino Sharps, MD CHIEF COMPLAINT:  Seizures, vomiting   History of Present Illness:  33 year old female with below PMHx with recent hospitalization for seizure from 12/15-12/20 and prior to this DKA on 12/10. She presented on 12/25 for general pain, weakness and malaise. Patient reported as poor historian. Labs significant for Na 113, K >7.5 CO2 <7 glucose 1200, elevated LFTs. She was started on insulin  gtt and IVF for DKA. VBG with pH 7.185. Nephrology consulted for HD. PCCM consulted for worsening mental status with facial twitching, initially protecting airway. On exam patient had emesis and not protecting airway so intubated in the ED.  Pertinent  Medical History  Seizure, ESRD on HD, DM1 - poorly controlled, bipolar disorder  Significant Hospital Events: Including procedures, antibiotic start and stop dates in addition to other pertinent events     Interim History / Subjective:  Anion gap improved with tube feeds.  Blood glucoses hypoglycemic but improved with tube feeds.  Wean sedation this morning and subsequently very agitated.  Therefore extubated to nasal cannula.  Objective    Blood pressure 131/61, pulse 95, temperature 98.1 F (36.7 C), temperature source Axillary, resp. rate 14, weight 65.8 kg, SpO2 100%.    Vent Mode: CPAP;PSV FiO2 (%):  [40 %] 40 % Set Rate:  [18 bmp] 18 bmp Vt Set:  [420 mL] 420 mL PEEP:  [5 cmH20] 5 cmH20 Pressure Support:  [8 cmH20] 8 cmH20 Plateau Pressure:  [16 cmH20-20 cmH20] 20 cmH20   Intake/Output Summary (Last 24 hours) at 04/26/2024 0908 Last data filed at 04/26/2024 0700 Gross per 24 hour  Intake 1386.18 ml  Output 1170 ml  Net 216.18 ml   Filed Weights   04/24/24 0500 04/25/24 1202 04/26/24 0500  Weight: 66.4 kg 65.4 kg 65.8 kg    Physical Exam: General: Chronically  ill-appearing, agitated Eyes: No icterus Respiratory: Respirations 16 on ventilator, very agitated Cardiovascular: tachycardic GI: Nondistended Neuro: Encephalopathic, agitated, moves all extremities    Resolved problem list   Assessment and Plan   Presumed seizures: Description of seizure-like activity on admission.  Likely due to hyperglycemia, DKA. -- Continue AEDs per neurology, no seizures demonstrated on EEG  Acute metabolic encephalopathy: Due to hypoglycemia, postictal state etc.  Seemingly resolved to improved.  Now agitated. -- assess off vent  Acute hypoxemic respiratory failure due to presumed pneumonia as well as now volume overload with fluid resuscitation and DKA. -- 5-day ceftriaxone  ordered for pneumonia -- IHD Tuesday Thursday Saturday, received 12/27 --extubated 12/28, goal sat 90%  Hypotension: Likely related to volume depletion, hypovolemia in the setting of DKA.  Now resolved. -- Continue blood pressure monitoring  Type 1 diabetes with hyperglycemia, DKA: Now anion gap closed. Sugars up and down.  --Continue subcutaneous insulin   End-stage renal disease: --Tuesday, Thursday, Saturday dialysis  Labs   CBC: Recent Labs  Lab 04/23/24 0910 04/23/24 0953 04/23/24 1015 04/23/24 1016 04/23/24 1659 04/24/24 0750 04/25/24 0720  WBC 10.4  --   --   --   --  19.5* 16.4*  NEUTROABS 7.5  --   --   --   --   --   --   HGB 10.5*   < > 12.9 12.6 11.6* 12.6 11.1*  HCT 39.5   < > 38.0 37.0 34.0* 36.7 33.1*  MCV 100.0  --   --   --   --  78.6* 78.4*  PLT 222  --   --   --   --  222 197   < > = values in this interval not displayed.    Basic Metabolic Panel: Recent Labs  Lab 04/23/24 1708 04/23/24 2059 04/24/24 0724 04/25/24 0728 04/26/24 0429  NA 120* 127* 125* 126* 132*  K 6.6* 3.5 4.5 4.0 3.7  CL 87* 89* 90* 88* 94*  CO2 13* 24 21* 19* 26  GLUCOSE 904* 287* 117* 223* 129*  BUN 70* 37* 39* 45* 28*  CREATININE 5.56* 3.23* 3.87* 4.68* 3.50*   CALCIUM  8.9 8.4* 8.2* 8.6* 7.9*  MG  --   --   --  1.8 2.0  PHOS  --   --   --  4.5  4.4 4.0   GFR: Estimated Creatinine Clearance: 21.3 mL/min (A) (by C-G formula based on SCr of 3.5 mg/dL (H)). Recent Labs  Lab 04/23/24 0910 04/24/24 0750 04/24/24 1216 04/25/24 0720  WBC 10.4 19.5*  --  16.4*  LATICACIDVEN  --   --  2.2*  --     Liver Function Tests: Recent Labs  Lab 04/23/24 0910 04/25/24 0728  AST 59*  --   ALT 133*  --   ALKPHOS 722*  --   BILITOT 0.6  --   PROT 5.8*  --   ALBUMIN  3.6 2.9*   No results for input(s): LIPASE, AMYLASE in the last 168 hours. No results for input(s): AMMONIA in the last 168 hours.  ABG    Component Value Date/Time   PHART 7.223 (L) 04/23/2024 1659   PCO2ART 45.4 04/23/2024 1659   PO2ART 298 (H) 04/23/2024 1659   HCO3 18.8 (L) 04/23/2024 1659   TCO2 20 (L) 04/23/2024 1659   ACIDBASEDEF 9.0 (H) 04/23/2024 1659   O2SAT 100 04/23/2024 1659     Coagulation Profile: No results for input(s): INR, PROTIME in the last 168 hours.  Cardiac Enzymes: No results for input(s): CKTOTAL, CKMB, CKMBINDEX, TROPONINI in the last 168 hours.  HbA1C: Hemoglobin A1C  Date/Time Value Ref Range Status  11/11/2013 04:31 AM 14.6 (H) 4.2 - 6.3 % Final    Comment:    The American Diabetes Association recommends that a primary goal of therapy should be <7% and that physicians should reevaluate the treatment regimen in patients with HbA1c values consistently >8%.   09/12/2011 02:53 AM SEE COMMENT 4.2 - 6.3 % Final    Comment:    The American Diabetes Association recommends that a primary goal of therapy should be <7% and that physicians should reevaluate the treatment regimen in patients with HbA1c values consistently >8%. HGB A1C - Unable to perform testing at [facility] due  - to interfering substance. HGB A1C - H -- 16.8 %  - Reference Range: 4.8-5.6  - ----------------------------------------  - Increased risk for diabetes:  5.7-6.4  - Diabetes: >6.4  - Glycemic control for adults with  - .SABRA... diabetes: <7.0  - ----------------------------------------  - PERFORMED BY [employer] [place] [employer] SPEC.NO.: [phone]  - [address] [id number]  - ELSIE JULIANNA DROSS, MD  - [phone]    HbA1c, POC (controlled diabetic range)  Date/Time Value Ref Range Status  06/04/2018 09:29 AM   Final    Comment:    >15   Hgb A1c MFr Bld  Date/Time Value Ref Range Status  03/30/2024 03:34 AM 8.5 (H) 4.8 - 5.6 % Final    Comment:    (NOTE)  Prediabetes: 5.7 - 6.4         Diabetes: >6.4         Glycemic control for adults with diabetes: <7.0   12/20/2023 03:05 AM 10.8 (H) 4.8 - 5.6 % Final    Comment:    (NOTE) Diagnosis of Diabetes The following HbA1c ranges recommended by the American Diabetes Association (ADA) may be used as an aid in the diagnosis of diabetes mellitus.  Hemoglobin             Suggested A1C NGSP%              Diagnosis  <5.7                   Non Diabetic  5.7-6.4                Pre-Diabetic  >6.4                   Diabetic  <7.0                   Glycemic control for                       adults with diabetes.      CBG: Recent Labs  Lab 04/25/24 1932 04/25/24 2004 04/25/24 2314 04/26/24 0309 04/26/24 0732  GLUCAP 51* 138* 108* 115* 191*   CRITICAL CARE Performed by: Donnice SAUNDERS Dannie Hattabaugh   Total critical care time: 31 minutes  Critical care time was exclusive of separately billable procedures and treating other patients. Critical care was necessary to treat or prevent imminent or life-threatening deterioration.  Critical care was time spent personally by me on the following activities: development of treatment plan with patient and/or surrogate as well as nursing, discussions with consultants, evaluation of patient's response to treatment, examination of patient, obtaining history from patient or surrogate, ordering and performing treatments and  interventions, ordering and review of laboratory studies, ordering and review of radiographic studies, pulse oximetry and re-evaluation of patient's condition.  Donnice SAUNDERS Beals, MD [facility] Amion for pager  04/26/2024, 9:08 AM         "

## 2024-04-26 NOTE — Progress Notes (Signed)
 LTM VIDEO EEG discontinued - no skin breakdown at [facility].

## 2024-04-26 NOTE — Progress Notes (Signed)
 LTM maint complete - no skin breakdown under:  Pz,P3,P7

## 2024-04-26 NOTE — Progress Notes (Addendum)
 Initial Nutrition Assessment  DOCUMENTATION CODES:   Not applicable  INTERVENTION:   -Continue renal, carb modified diet with 1.2 L fluid restriction -D/c Prosource Plus BID -Nepro Shake po BID with meals, each supplement provides 425 kcal and 19 grams protein  -Renal MVI daily -Per review of PTA medications, patient is prescribed 3,000 mg pancrealipase TID PTA; reached out to pharmacy, RN, and MD to discuss resumption of medication now that patient is on a PO diet. MD agreeable to order, however, RN reports patient with variable compliance of medication acceptance, currently taking only pain medications  NUTRITION DIAGNOSIS:   Increased nutrient needs related to chronic illness (ESRD on HD) as evidenced by estimated needs.  GOAL:   Patient will meet greater than or equal to 90% of their needs  MONITOR:   PO intake, Supplement acceptance  REASON FOR ASSESSMENT:   Consult Enteral/tube feeding initiation and management  ASSESSMENT:   33 y.o. female with medical history significant of ESRD on HD, poorly controlled type 1 diabetes mellitus, bipolar disorder, and noncompliance presents with generalized pain.  Patient admitted with DKA.  12/25- intubated, LTM EEG initiated 12/27- TF initiated 12/28- extubated, advanced to PO diet  Reviewed I/O's: +1.2 L x 24 hours and +4.6 L since admission  UOP: 0 ml x 24 hours  Patient unavailable at time of visit. Attempted to speak with patient via call to hospital room phone, however, unable to reach. RD unable to obtain further nutrition-related history or complete nutrition-focused physical exam at this time.   Per neurology notes, continuous EEG revealed no seizure activity.    Case discussed with RN, who confirmed that patient was extubated this morning. RD asked about potential readiness for PO diet (patient currently with no enteral access); TF orders have been d/c'd. Per discussion with RN, patient just passed swallow screen and  advanced to PO diet.   Patient currently on a renal, carb modified diet with 1.2 L fluid restriction. No meal completion data available to assess at this time.   Reviewed weight history; weight has ranged from 53.6-68.6 kg over the past 2 months. Patient is ESRD on HD and suspect weight fluctuations, which makes weight trends difficult to interpret. EDW is 57.5 kg; patient is over her dry weight and suspect nutrition-focused physical exam would mask fat and muscle depletions in addition for potential weight loss. She receives HD PTA on Tuesday, Thursday, Saturday [facility] at [facility].   Per TOC notes, patient recent discharged home on 04/19/24. DM coordinator has been consulted due to investigate medication adherence. She has had 45 ED/ admissions over the past year. She is followed by Transitions of Care office at home, but SDOH needs often vary. Main reasons for admissions are DKA and needing HD. There are times in which patient does not take medications and misses HD. She often presents to the hospital prior to being able to follow-up with her PCP.  Medications reviewed and include vimpat , protonix , and miralax .   Noted patient takes 3,000 mg pancrealipase TID PTA; discussed with RN, MD, and pharmacy regarding resumption of order as patient is now on PO diet.   Lab Results  Component Value Date   HGBA1C 8.5 (H) 03/30/2024   PTA DM medications are 5 units lantus  daily and SSI novolog  TID before meals.   Labs reviewed: CBGS: 108-191 (inpatient orders for glycemic control are 0-9 units insulin  aspart TID with meals and 5 units insulin  glargine daily).    Diet Order:   Diet Order  Diet renal/carb modified with fluid restriction Diet-HS Snack? Nothing; Fluid restriction: 1200 mL Fluid; Room service appropriate? Yes; Fluid consistency: Thin  Diet effective now                   EDUCATION NEEDS:   No education needs have been identified at this time  Skin:  Skin  Assessment: Skin Integrity Issues: Skin Integrity Issues:: Incisions Incisions: closed right neck  Last BM:  04/26/24  Height:   Ht Readings from Last 1 Encounters:  04/13/24 5' 3 (1.6 m)    Weight:   Wt Readings from Last 1 Encounters:  04/26/24 65.8 kg    Ideal Body Weight:  52.3 kg  BMI:  Body mass index is 25.7 kg/m.  Estimated Nutritional Needs:   Kcal:  1700-1900  Protein:  90-105 grams  Fluid:  1000 ml + UOP    Margery ORN, RD, LDN, CDCES Registered Dietitian III Certified Diabetes Care and Education Specialist If unable to reach this RD, please use RD Inpatient group chat on secure chat between hours of 8am-4 pm daily

## 2024-04-26 NOTE — Procedures (Addendum)
 Patient Name: Ashley Freeman  MRN: [id number]  Epilepsy Attending: Arlin MALVA Krebs  Referring Physician/Provider: everitt Clint Abbey Earle FORBES, NP  Duration: 04/25/2024 1644 to 04/26/2024 1238   Patient history:  33 y.o. female who originally presented with generalized pain. She was noted to have an extremely high blood glucose and was found to be in DKA.  She then had seizure activity in the emergency department. EEG to evaluate for seizure   Level of alertness: comatose/ lethargic   AEDs during EEG study: LCM, propofol    Technical aspects: This EEG study was done with scalp electrodes positioned according to the 10-20 International system of electrode placement. Electrical activity was reviewed with band pass filter of 1-70Hz , sensitivity of 7 uV/mm, display speed of 79mm/sec with a 60Hz  notched filter applied as appropriate. EEG data were recorded continuously and digitally stored.  Video monitoring was available and reviewed as appropriate.   Description: EEG showed continuous generalized 3-6hz  theta-delta slowing. Hyperventilation and photic stimulation were not performed.      ABNORMALITY - Continuous slow, generalized   IMPRESSION: This study is suggestive of severe diffuse encephalopathy. No seizures or epileptiform discharges were seen throughout the recording.   Douglas Rooks O Asani Deniston

## 2024-04-27 LAB — GLUCOSE, CAPILLARY
Glucose-Capillary: 117 mg/dL — ABNORMAL HIGH (ref 70–99)
Glucose-Capillary: 123 mg/dL — ABNORMAL HIGH (ref 70–99)
Glucose-Capillary: 159 mg/dL — ABNORMAL HIGH (ref 70–99)
Glucose-Capillary: 172 mg/dL — ABNORMAL HIGH (ref 70–99)
Glucose-Capillary: 177 mg/dL — ABNORMAL HIGH (ref 70–99)
Glucose-Capillary: 204 mg/dL — ABNORMAL HIGH (ref 70–99)
Glucose-Capillary: 63 mg/dL — ABNORMAL LOW (ref 70–99)
Glucose-Capillary: 99 mg/dL (ref 70–99)

## 2024-04-27 LAB — TRIGLYCERIDES: Triglycerides: 191 mg/dL — ABNORMAL HIGH

## 2024-04-27 LAB — BASIC METABOLIC PANEL WITH GFR
Anion gap: 15 (ref 5–15)
BUN: 31 mg/dL — ABNORMAL HIGH (ref 6–20)
CO2: 24 mmol/L (ref 22–32)
Calcium: 8.6 mg/dL — ABNORMAL LOW (ref 8.9–10.3)
Chloride: 95 mmol/L — ABNORMAL LOW (ref 98–111)
Creatinine, Ser: 4.27 mg/dL — ABNORMAL HIGH (ref 0.44–1.00)
GFR, Estimated: 13 mL/min — ABNORMAL LOW
Glucose, Bld: 129 mg/dL — ABNORMAL HIGH (ref 70–99)
Potassium: 3.5 mmol/L (ref 3.5–5.1)
Sodium: 134 mmol/L — ABNORMAL LOW (ref 135–145)

## 2024-04-27 LAB — MAGNESIUM: Magnesium: 2.1 mg/dL (ref 1.7–2.4)

## 2024-04-27 LAB — PHOSPHORUS: Phosphorus: 5.2 mg/dL — ABNORMAL HIGH (ref 2.5–4.6)

## 2024-04-27 MED ORDER — SEVELAMER CARBONATE 800 MG PO TABS
800.0000 mg | ORAL_TABLET | Freq: Three times a day (TID) | ORAL | Status: DC
  Administered 2024-04-27 – 2024-04-28 (×3): 800 mg via ORAL
  Filled 2024-04-27 (×6): qty 1

## 2024-04-27 MED ORDER — CHLORHEXIDINE GLUCONATE CLOTH 2 % EX PADS
6.0000 | MEDICATED_PAD | Freq: Every day | CUTANEOUS | Status: DC
  Administered 2024-04-28: 6 via TOPICAL

## 2024-04-27 MED ORDER — PANTOPRAZOLE SODIUM 40 MG PO TBEC
40.0000 mg | DELAYED_RELEASE_TABLET | Freq: Two times a day (BID) | ORAL | Status: DC
  Administered 2024-04-27 (×2): 40 mg via ORAL
  Filled 2024-04-27 (×2): qty 1

## 2024-04-27 NOTE — Plan of Care (Addendum)
 [id number] Beals, MD rounded on pt.  See provider note.  1120:  Pt refused PT.  See PT note.  1150:  Pt transferred to [facility] with 2 person assist.  Pt complains of dizziness upon ambulating. Nephrologist rounded on pt.  See provider note.  1710:  Pt transferred to 4P Room 10.  Pt in possession of cell phone, phone charger and grey slide on flops.  Pt left in the care of oncoming RN at bedside.  Pt VSS and showing no signs of distress.    Problem: Education: Goal: Knowledge of General Education information will improve Description: Including pain rating scale, medication(s)/side effects and non-pharmacologic comfort measures Outcome: Progressing   Problem: Health Behavior/Discharge Planning: Goal: Ability to manage health-related needs will improve Outcome: Progressing   Problem: Clinical Measurements: Goal: Ability to maintain clinical measurements within normal limits will improve Outcome: Progressing Goal: Will remain free from infection Outcome: Progressing Goal: Diagnostic test results will improve Outcome: Progressing Goal: Respiratory complications will improve Outcome: Progressing Goal: Cardiovascular complication will be avoided Outcome: Progressing   Problem: Activity: Goal: Risk for activity intolerance will decrease Outcome: Progressing   Problem: Nutrition: Goal: Adequate nutrition will be maintained Outcome: Progressing   Problem: Coping: Goal: Level of anxiety will decrease Outcome: Progressing   Problem: Elimination: Goal: Will not experience complications related to bowel motility Outcome: Progressing Goal: Will not experience complications related to urinary retention Outcome: Progressing   Problem: Pain Managment: Goal: General experience of comfort will improve and/or be controlled Outcome: Progressing   Problem: Safety: Goal: Ability to remain free from injury will improve Outcome: Progressing   Problem: Skin Integrity: Goal: Risk for impaired  skin integrity will decrease Outcome: Progressing   Problem: Activity: Goal: Ability to tolerate increased activity will improve Outcome: Progressing   Problem: Respiratory: Goal: Ability to maintain a clear airway and adequate ventilation will improve Outcome: Progressing   Problem: Role Relationship: Goal: Method of communication will improve Outcome: Progressing   Problem: Safety: Goal: Non-violent Restraint(s) Outcome: Progressing   Problem: Education: Goal: Ability to describe self-care measures that may prevent or decrease complications (Diabetes Survival Skills Education) will improve Outcome: Progressing Goal: Individualized Educational Video(s) Outcome: Progressing   Problem: Coping: Goal: Ability to adjust to condition or change in health will improve Outcome: Progressing   Problem: Fluid Volume: Goal: Ability to maintain a balanced intake and output will improve Outcome: Progressing   Problem: Health Behavior/Discharge Planning: Goal: Ability to identify and utilize available resources and services will improve Outcome: Progressing Goal: Ability to manage health-related needs will improve Outcome: Progressing   Problem: Metabolic: Goal: Ability to maintain appropriate glucose levels will improve Outcome: Progressing   Problem: Nutritional: Goal: Maintenance of adequate nutrition will improve Outcome: Progressing Goal: Progress toward achieving an optimal weight will improve Outcome: Progressing   Problem: Skin Integrity: Goal: Risk for impaired skin integrity will decrease Outcome: Progressing   Problem: Tissue Perfusion: Goal: Adequacy of tissue perfusion will improve Outcome: Progressing

## 2024-04-27 NOTE — Progress Notes (Signed)
 "  NAME:  Ashley Freeman, MRN:  [id number], DOB:  Jan 04, 1991, LOS: 4 ADMISSION DATE:  04/23/2024, CONSULTATION DATE:  04/23/24 REFERRING MD:  Maximino Sharps, MD CHIEF COMPLAINT:  Seizures, vomiting   History of Present Illness:  33 year old female with below PMHx with recent hospitalization for seizure from 12/15-12/20 and prior to this DKA on 12/10. She presented on 12/25 for general pain, weakness and malaise. Patient reported as poor historian. Labs significant for Na 113, K >7.5 CO2 <7 glucose 1200, elevated LFTs. She was started on insulin  gtt and IVF for DKA. VBG with pH 7.185. Nephrology consulted for HD. PCCM consulted for worsening mental status with facial twitching, initially protecting airway. On exam patient had emesis and not protecting airway so intubated in the ED.  Pertinent  Medical History  Seizure, ESRD on HD, DM1 - poorly controlled, bipolar disorder  Significant Hospital Events: Including procedures, antibiotic start and stop dates in addition to other pertinent events     Interim History / Subjective:  BP improved, respiratory status stable.   Objective    Blood pressure 137/63, pulse 78, temperature 98.6 F (37 C), temperature source Oral, resp. rate 17, weight 64.2 kg, SpO2 99%.    Vent Mode: CPAP;PSV FiO2 (%):  [40 %] 40 % PEEP:  [5 cmH20] 5 cmH20 Pressure Support:  [8 cmH20] 8 cmH20   Intake/Output Summary (Last 24 hours) at 04/27/2024 0747 Last data filed at 04/26/2024 1900 Gross per 24 hour  Intake 432.39 ml  Output --  Net 432.39 ml   Filed Weights   04/25/24 1202 04/26/24 0500 04/27/24 0500  Weight: 65.4 kg 65.8 kg 64.2 kg    Physical Exam: General: Chronically ill-appearing, agitated Eyes: No icterus Respiratory: NWOB on 2L [place] Cardiovascular: RRR GI: Nondistended Neuro: alert, no deficits    Resolved problem list   Assessment and Plan   Presumed seizures: Description of seizure-like activity on admission.  Likely due to  hyperglycemia, DKA. -- Continue AEDs per neurology, no seizures demonstrated on EEG, EEG d/c'd  Acute metabolic encephalopathy: Due to hypoglycemia, postictal state etc.  Seemingly resolved to improved.  Now agitated. -- slowly improving appears close to baseline  Acute hypoxemic respiratory failure due to presumed pneumonia as well as now volume overload with fluid resuscitation and DKA. -- 7-day ceftriaxone  ordered for pneumonia, extended with burden of mucous -- IHD Tuesday Thursday Saturday, received 12/27 -- extubated 12/28, goal sat 90%, wean O2 as able  Hypotension: Likely related to volume depletion, hypovolemia in the setting of DKA.  Now resolved.  Cardiomyopathy: --Entresto , coreg  resumed 12/28  Type 1 diabetes with hyperglycemia, DKA: Now anion gap closed. Sugars up and down.  --Continue subcutaneous insulin   End-stage renal disease: --Tuesday, Thursday, Saturday dialysis  Labs   CBC: Recent Labs  Lab 04/23/24 0910 04/23/24 0953 04/23/24 1015 04/23/24 1016 04/23/24 1659 04/24/24 0750 04/25/24 0720  WBC 10.4  --   --   --   --  19.5* 16.4*  NEUTROABS 7.5  --   --   --   --   --   --   HGB 10.5*   < > 12.9 12.6 11.6* 12.6 11.1*  HCT 39.5   < > 38.0 37.0 34.0* 36.7 33.1*  MCV 100.0  --   --   --   --  78.6* 78.4*  PLT 222  --   --   --   --  222 197   < > = values in this interval  not displayed.    Basic Metabolic Panel: Recent Labs  Lab 04/24/24 0724 04/25/24 0728 04/26/24 0429 04/26/24 1744 04/27/24 0444  NA 125* 126* 132* 133* 134*  K 4.5 4.0 3.7 3.6 3.5  CL 90* 88* 94* 93* 95*  CO2 21* 19* 26 26 24   GLUCOSE 117* 223* 129* 85 129*  BUN 39* 45* 28* 30* 31*  CREATININE 3.87* 4.68* 3.50* 3.92* 4.27*  CALCIUM  8.2* 8.6* 7.9* 8.5* 8.6*  MG  --  1.8 2.0  --  2.1  PHOS  --  4.5  4.4 4.0  --  5.2*   GFR: Estimated Creatinine Clearance: 17.2 mL/min (A) (by C-G formula based on SCr of 4.27 mg/dL (H)). Recent Labs  Lab 04/23/24 0910 04/24/24 0750  04/24/24 1216 04/25/24 0720  WBC 10.4 19.5*  --  16.4*  LATICACIDVEN  --   --  2.2*  --     Liver Function Tests: Recent Labs  Lab 04/23/24 0910 04/25/24 0728  AST 59*  --   ALT 133*  --   ALKPHOS 722*  --   BILITOT 0.6  --   PROT 5.8*  --   ALBUMIN  3.6 2.9*   No results for input(s): LIPASE, AMYLASE in the last 168 hours. No results for input(s): AMMONIA in the last 168 hours.  ABG    Component Value Date/Time   PHART 7.223 (L) 04/23/2024 1659   PCO2ART 45.4 04/23/2024 1659   PO2ART 298 (H) 04/23/2024 1659   HCO3 18.8 (L) 04/23/2024 1659   TCO2 20 (L) 04/23/2024 1659   ACIDBASEDEF 9.0 (H) 04/23/2024 1659   O2SAT 100 04/23/2024 1659     Coagulation Profile: No results for input(s): INR, PROTIME in the last 168 hours.  Cardiac Enzymes: No results for input(s): CKTOTAL, CKMB, CKMBINDEX, TROPONINI in the last 168 hours.  HbA1C: Hemoglobin A1C  Date/Time Value Ref Range Status  11/11/2013 04:31 AM 14.6 (H) 4.2 - 6.3 % Final    Comment:    The American Diabetes Association recommends that a primary goal of therapy should be <7% and that physicians should reevaluate the treatment regimen in patients with HbA1c values consistently >8%.   09/12/2011 02:53 AM SEE COMMENT 4.2 - 6.3 % Final    Comment:    The American Diabetes Association recommends that a primary goal of therapy should be <7% and that physicians should reevaluate the treatment regimen in patients with HbA1c values consistently >8%. HGB A1C - Unable to perform testing at [facility] due  - to interfering substance. HGB A1C - H -- 16.8 %  - Reference Range: 4.8-5.6  - ----------------------------------------  - Increased risk for diabetes: 5.7-6.4  - Diabetes: >6.4  - Glycemic control for adults with  - .SABRA... diabetes: <7.0  - ----------------------------------------  - PERFORMED BY [facility] [place] [facility] SPEC.NO.: [id number]  - [address] [phone]  - ELSIE JULIANNA DROSS, MD  - [phone]    HbA1c, POC (controlled diabetic range)  Date/Time Value Ref Range Status  06/04/2018 09:29 AM   Final    Comment:    >15   Hgb A1c MFr Bld  Date/Time Value Ref Range Status  03/30/2024 03:34 AM 8.5 (H) 4.8 - 5.6 % Final    Comment:    (NOTE)         Prediabetes: 5.7 - 6.4         Diabetes: >6.4         Glycemic control for adults with diabetes: <7.0   12/20/2023  03:05 AM 10.8 (H) 4.8 - 5.6 % Final    Comment:    (NOTE) Diagnosis of Diabetes The following HbA1c ranges recommended by the American Diabetes Association (ADA) may be used as an aid in the diagnosis of diabetes mellitus.  Hemoglobin             Suggested A1C NGSP%              Diagnosis  <5.7                   Non Diabetic  5.7-6.4                Pre-Diabetic  >6.4                   Diabetic  <7.0                   Glycemic control for                       adults with diabetes.      CBG: Recent Labs  Lab 04/26/24 1528 04/26/24 1921 04/26/24 2000 04/27/24 0006 04/27/24 0408  GLUCAP 124* 59* 72 123* 117*    Donnice JONELLE Beals, MD [facility] Pulmonary/Critical Care Medicine Amion for pager  04/27/2024, 7:47 AM         "

## 2024-04-27 NOTE — Inpatient Diabetes Management (Signed)
 Inpatient Diabetes Program Recommendations  AACE/ADA: New Consensus Statement on Inpatient Glycemic Control (2015)  Target Ranges:  Prepandial:   less than 140 mg/dL      Peak postprandial:   less than 180 mg/dL (1-2 hours)      Critically ill patients:  140 - 180 mg/dL   Lab Results  Component Value Date   GLUCAP 172 (H) 04/27/2024   HGBA1C 8.5 (H) 03/30/2024    Review of Glycemic Control  Latest Reference Range & Units 04/27/24 00:06 04/27/24 04:08 04/27/24 08:21 04/27/24 11:19  Glucose-Capillary 70 - 99 mg/dL 876 (H) 882 (H) 795 (H) 172 (H)   Diabetes history: DM 2 Outpatient Diabetes medications:  Lantus  5 units daily Novolog  2-8 units daily Current orders for Inpatient glycemic control:  Novolog  0-9 units tid with meals Lantus  5 units daily  Inpatient Diabetes Program Recommendations:    May need slight reduction in Novolog  correction to very sensitive (0-6 units) if patient not eating well.   Thanks,  Randall Bullocks, RN, BC-ADM Inpatient Diabetes Coordinator Pager [phone]  (8a-5p)

## 2024-04-27 NOTE — TOC Progression Note (Signed)
 Transition of Care [facility]) - Progression Note    Patient Details  Name: Ashley Freeman MRN: [id number] Date of Birth: 1990-09-21  [facility] CM/SW Contact  Kamorie Aldous E Geet Hosking, LCSW Phone Number: 04/27/2024, 11:32 AM  Clinical Narrative:    ICM following for therapy recs, noted patient refused PT today.   Expected Discharge Plan: Home/Self Care Barriers to Discharge: Continued Medical Work up               Expected Discharge Plan and Services       Living arrangements for the past 2 months: Single Family Home                                       Social Drivers of Health (SDOH) Interventions SDOH Screenings   Food Insecurity: No Food Insecurity (04/13/2024)  Housing: Low Risk (04/13/2024)  Transportation Needs: No Transportation Needs (04/13/2024)  Utilities: Not At Risk (04/13/2024)  Financial Resource Strain: Medium Risk (11/15/2023)   Received from [facility]  Physical Activity: Insufficiently Active (03/02/2022)   Received from [facility]  Social Connections: Moderately Integrated (02/04/2024)  Stress: No Stress Concern Present (03/08/2023)   Received from [facility]  Tobacco Use: Low Risk (04/23/2024)    Readmission Risk Interventions    03/23/2024    2:19 PM 02/06/2024    2:01 PM 11/28/2023   11:49 AM  Readmission Risk Prevention Plan  Transportation Screening Complete Complete Complete  Medication Review Oceanographer) Complete Referral to Pharmacy Complete  PCP or Specialist appointment within 3-5 days of discharge Complete Complete Complete  HRI or Home Care Consult Complete Complete Complete  SW Recovery Care/Counseling Consult  Complete   Palliative Care Screening Not Applicable Not Applicable Not Applicable  Skilled Nursing Facility Not Applicable Not Applicable Not Applicable

## 2024-04-27 NOTE — Progress Notes (Signed)
 PT Cancellation Note  Patient Details Name: Ashley Freeman MRN: [id number] DOB: [birth year]   Cancelled Treatment:    Reason Eval/Treat Not Completed: Other (comment). Pt declined OOB mobility due to I'm tired. I haven't been asleep today because everyone keeps coming in. Pt educated on importance of OOB mobility however pt continued to decline to participate in PT. Acute PT to return as able to complete PT eval.  Ashley Freeman, PT, DPT Acute Rehabilitation Services Secure chat preferred Office #: [phone]    Ashley CHRISTELLA Freeman 04/27/2024, 11:24 AM

## 2024-04-27 NOTE — Progress Notes (Signed)
 [facility] NEPHROLOGY PROGRESS NOTE  Assessment/ Plan: Pt is a 33 y.o. yo female with type 1 diabetes, CHF, ESRD on HD following per dialysis management. Dialysis Orders: [facility] ([address] [phone]), TTS 3:45hr, 400/800, EDW 57.5kg, 2K/2.5Ca, R AVG Heparin  bolus 1600 units with HD Calcitriol  1mcg Sensipar  60mg  with HD  TUMS 1500mg  with HD   # Seizure disorder, acute metabolic encephalopathy:  - Neurology has seen; s/p EEG monitoring - anti-epileptics per neurology  # ESRD on HD:  - HD per TTS schedule   # Severe hyperkalemia presumably due to missed HD, improved with dialysis.  Note that she is on entresto  and if this is a recurrent issue would need to stop.  Her potassium is currently normal and has even been low in the past  -would keep 2K bath outpatient given this presentation  # Hyponatremia due to hyperglycemia (some pseudohypokalemia) and hypervolemia: on insulin , UF with HD as tolerated.  # Anemia of CKD: Hemoglobin at goal, no need for ESA.  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  Currently NPO.  # HTN/volume: Euvolemic, blood pressure mostly acceptable however elevated due to agitation.    Disposition - currently in ICU - disposition per primary team     Subjective:  She had 1 unmeasured urine void over 12/28.  Last HD on 12/27 with 1 kg UF.   No recent seizures per pt and RN.    Review of systems: Denies shortness of breath or chest pain  Denies n/v Just got up to go to bedside commode - was a little dizzy and she feels was related to pain med   Objective Vital signs in last 24 hours: Vitals:   04/27/24 0715 04/27/24 0800 04/27/24 0900 04/27/24 1000  BP: 137/63 (!) 137/50 (!) 144/59 (!) 151/57  Pulse:    77  Resp:  15 19 13   Temp: 98.7 F (37.1 C)     TempSrc: Oral     SpO2: 99%   97%  Weight:       Weight change: -1.2 kg  Intake/Output Summary (Last 24 hours) at 04/27/2024 1134 Last data filed  at 04/27/2024 1025 Gross per 24 hour  Intake 338 ml  Output 0 ml  Net 338 ml       Labs: RENAL PANEL Recent Labs  Lab 04/23/24 0910 04/23/24 0953 04/24/24 0724 04/25/24 0728 04/26/24 0429 04/26/24 1744 04/27/24 0444  NA 113*   < > 125* 126* 132* 133* 134*  K >7.5*   < > 4.5 4.0 3.7 3.6 3.5  CL 79*   < > 90* 88* 94* 93* 95*  CO2 <7*   < > 21* 19* 26 26 24   GLUCOSE >1,200*   < > 117* 223* 129* 85 129*  BUN 88*   < > 39* 45* 28* 30* 31*  CREATININE 6.79*   < > 3.87* 4.68* 3.50* 3.92* 4.27*  CALCIUM  8.8*   < > 8.2* 8.6* 7.9* 8.5* 8.6*  MG  --   --   --  1.8 2.0  --  2.1  PHOS  --   --   --  4.5  4.4 4.0  --  5.2*  ALBUMIN  3.6  --   --  2.9*  --   --   --    < > = values in this interval not displayed.    Liver Function Tests: Recent Labs  Lab 04/23/24 0910 04/25/24 0728  AST 59*  --   ALT 133*  --  ALKPHOS 722*  --   BILITOT 0.6  --   PROT 5.8*  --   ALBUMIN  3.6 2.9*   No results for input(s): LIPASE, AMYLASE in the last 168 hours. No results for input(s): AMMONIA in the last 168 hours. CBC: Recent Labs    11/17/23 1927 11/18/23 0630 11/18/23 0822 11/26/23 0045 04/23/24 1015 04/23/24 1016 04/23/24 1659 04/24/24 0750 04/25/24 0720  HGB 10.2* 10.9*  --    < > 12.9 12.6 11.6* 12.6 11.1*  MCV 88.0 88.4  --    < >  --   --   --  78.6* 78.4*  VITAMINB12  --  1,177*  --   --   --   --   --   --   --   FOLATE  --  12.1  --   --   --   --   --   --   --   FERRITIN  --   --  2,586*  --   --   --   --   --   --   TIBC  --   --  283  --   --   --   --   --   --   IRON   --   --  126  --   --   --   --   --   --   RETICCTPCT 2.4  --   --   --   --   --   --   --   --    < > = values in this interval not displayed.    Cardiac Enzymes: No results for input(s): CKTOTAL, CKMB, CKMBINDEX, TROPONINI in the last 168 hours. CBG: Recent Labs  Lab 04/26/24 2000 04/27/24 0006 04/27/24 0408 04/27/24 0821 04/27/24 1119  GLUCAP 72 123* 117* 204* 172*     Iron  Studies: No results for input(s): IRON , TIBC, TRANSFERRIN, FERRITIN in the last 72 hours. Studies/Results: No results found.   Medications: Infusions:  anticoagulant sodium citrate      cefTRIAXone  (ROCEPHIN )  IV Stopped (04/26/24 1812)    Scheduled Medications:  (feeding supplement) PROSource Plus  30 mL Oral BID BM   carvedilol   25 mg Oral BID WC   Chlorhexidine  Gluconate Cloth  6 each Topical Q0600   feeding supplement (NEPRO CARB STEADY)  237 mL Oral BID WC   heparin   5,000 Units Subcutaneous Q8H   insulin  aspart  0-9 Units Subcutaneous TID WC   insulin  glargine  5 Units Subcutaneous Daily   lacosamide   100 mg Oral BID   multivitamin  1 tablet Oral QHS   mouth rinse  15 mL Mouth Rinse 4 times per day   pantoprazole   40 mg Oral BID   sacubitril -valsartan   1 tablet Oral BID   sevelamer  carbonate  800 mg Oral TID WC   sodium chloride  flush  3 mL Intravenous Q12H    have reviewed scheduled and prn medications.  Physical Exam:    General adult female in bed in no acute distress HEENT normocephalic atraumatic extraocular movements intact sclera anicteric Neck supple trachea midline Lungs clear to auscultation bilaterally normal work of breathing at rest  Heart S1S2 no rub Abdomen soft nontender nondistended Extremities no edema  Psych normal mood and affect Neuro - provides her name, year, and that we are in a hospital; slightly slow to respond  Access RUE AVG with bruit    Ashley Freeman 04/27/2024,11:53 AM  LOS: 4  days

## 2024-04-28 ENCOUNTER — Other Ambulatory Visit (HOSPITAL_COMMUNITY): Payer: Self-pay

## 2024-04-28 ENCOUNTER — Telehealth (HOSPITAL_COMMUNITY): Payer: Self-pay

## 2024-04-28 DIAGNOSIS — T426X6A Underdosing of other antiepileptic and sedative-hypnotic drugs, initial encounter: Secondary | ICD-10-CM | POA: Diagnosis not present

## 2024-04-28 DIAGNOSIS — E1022 Type 1 diabetes mellitus with diabetic chronic kidney disease: Secondary | ICD-10-CM | POA: Diagnosis not present

## 2024-04-28 DIAGNOSIS — Z91148 Patient's other noncompliance with medication regimen for other reason: Secondary | ICD-10-CM | POA: Diagnosis not present

## 2024-04-28 DIAGNOSIS — Z992 Dependence on renal dialysis: Secondary | ICD-10-CM | POA: Diagnosis not present

## 2024-04-28 DIAGNOSIS — G40909 Epilepsy, unspecified, not intractable, without status epilepticus: Secondary | ICD-10-CM | POA: Diagnosis not present

## 2024-04-28 DIAGNOSIS — N186 End stage renal disease: Secondary | ICD-10-CM | POA: Diagnosis not present

## 2024-04-28 DIAGNOSIS — E1010 Type 1 diabetes mellitus with ketoacidosis without coma: Secondary | ICD-10-CM | POA: Diagnosis not present

## 2024-04-28 LAB — BASIC METABOLIC PANEL WITH GFR
Anion gap: 18 — ABNORMAL HIGH (ref 5–15)
BUN: 35 mg/dL — ABNORMAL HIGH (ref 6–20)
CO2: 19 mmol/L — ABNORMAL LOW (ref 22–32)
Calcium: 8.9 mg/dL (ref 8.9–10.3)
Chloride: 95 mmol/L — ABNORMAL LOW (ref 98–111)
Creatinine, Ser: 5.19 mg/dL — ABNORMAL HIGH (ref 0.44–1.00)
GFR, Estimated: 11 mL/min — ABNORMAL LOW
Glucose, Bld: 173 mg/dL — ABNORMAL HIGH (ref 70–99)
Potassium: 3.9 mmol/L (ref 3.5–5.1)
Sodium: 133 mmol/L — ABNORMAL LOW (ref 135–145)

## 2024-04-28 LAB — VITAMIN D 25 HYDROXY (VIT D DEFICIENCY, FRACTURES): Vit D, 25-Hydroxy: <6 ng/mL — ABNORMAL LOW (ref 30–100)

## 2024-04-28 LAB — GLUCOSE, CAPILLARY
Glucose-Capillary: 184 mg/dL — ABNORMAL HIGH (ref 70–99)
Glucose-Capillary: 188 mg/dL — ABNORMAL HIGH (ref 70–99)
Glucose-Capillary: 201 mg/dL — ABNORMAL HIGH (ref 70–99)

## 2024-04-28 LAB — PHOSPHORUS: Phosphorus: 6.1 mg/dL — ABNORMAL HIGH (ref 2.5–4.6)

## 2024-04-28 LAB — MAGNESIUM: Magnesium: 2.3 mg/dL (ref 1.7–2.4)

## 2024-04-28 MED ORDER — LOPERAMIDE HCL 2 MG PO CAPS
4.0000 mg | ORAL_CAPSULE | Freq: Once | ORAL | Status: AC
  Administered 2024-04-28: 4 mg via ORAL

## 2024-04-28 MED ORDER — LACOSAMIDE 50 MG PO TABS
50.0000 mg | ORAL_TABLET | ORAL | Status: DC
  Administered 2024-04-28: 50 mg via ORAL
  Filled 2024-04-28: qty 1

## 2024-04-28 MED ORDER — LOPERAMIDE HCL 2 MG PO CAPS
ORAL_CAPSULE | ORAL | Status: AC
  Filled 2024-04-28: qty 1

## 2024-04-28 MED ORDER — LIDOCAINE-PRILOCAINE 2.5-2.5 % EX CREA: 1.0000 | TOPICAL_CREAM | CUTANEOUS | Status: DC | PRN

## 2024-04-28 MED ORDER — DIAZEPAM (15 MG DOSE) 2 X 7.5 MG/0.1ML NA LQPK
15.0000 mg | NASAL | 3 refills | Status: AC | PRN
  Filled 2024-04-28: qty 5, 30d supply, fill #0

## 2024-04-28 MED ORDER — LIDOCAINE HCL (PF) 1 % IJ SOLN: 5.0000 mL | INTRAMUSCULAR | Status: DC | PRN

## 2024-04-28 MED ORDER — HEPARIN SODIUM (PORCINE) 1000 UNIT/ML DIALYSIS: 1000.0000 [IU] | INTRAMUSCULAR | Status: DC | PRN

## 2024-04-28 MED ORDER — OXYCODONE HCL 5 MG PO TABS: 5.0000 mg | ORAL_TABLET | Freq: Four times a day (QID) | ORAL | Status: DC | PRN

## 2024-04-28 MED ORDER — LOPERAMIDE HCL 2 MG PO CAPS: 2.0000 mg | ORAL_CAPSULE | Freq: Once | ORAL | Status: DC

## 2024-04-28 MED ORDER — ANTICOAGULANT SODIUM CITRATE 4% (200MG/5ML) IV SOLN: 5.0000 mL | Status: DC | PRN

## 2024-04-28 MED ORDER — PENTAFLUOROPROP-TETRAFLUOROETH EX AERO: 1.0000 | INHALATION_SPRAY | CUTANEOUS | Status: DC | PRN

## 2024-04-28 NOTE — Progress Notes (Signed)
" °   04/28/24 1206  Vitals  Temp 97.9 F (36.6 C)  Pulse Rate 82  Resp 17  BP (!) 193/69  SpO2 100 %  O2 Device Nasal Cannula  Type of Weight Post-Dialysis  Oxygen Therapy  O2 Flow Rate (L/min) 2 L/min  Patient Activity (if Appropriate) In bed  Pulse Oximetry Type Continuous  Post Treatment  Dialyzer Clearance Clear  Liters Processed 73.5  Fluid Removed (mL) 3000 mL  Tolerated HD Treatment Yes  Post-Hemodialysis Comments Pt. tolerated tx well  AVG/AVF Arterial Site Held (minutes) 5 minutes  AVG/AVF Venous Site Held (minutes) 5 minutes    "

## 2024-04-28 NOTE — Progress Notes (Signed)
 Ashley Freeman  Assessment/ Plan: Pt is a 33 y.o. yo female with type 1 diabetes, CHF, ESRD on HD following per dialysis management. Dialysis Orders: [facility] ([address] [phone]), TTS 3:45hr, 400/800, EDW 57.5kg, 2K/2.5Ca, R AVG Heparin  bolus 1600 units with HD Calcitriol  1mcg Sensipar  60mg  with HD  TUMS 1500mg  with HD   # Seizure disorder, acute metabolic encephalopathy:  - Neurology has seen; s/p EEG monitoring - anti-epileptics per neurology  # ESRD on HD:  - HD per TTS schedule   # Severe hyperkalemia presumably due to missed HD, improved with dialysis.   - This was persistent on repeat.  Freeman that she is on entresto  and if this is a recurrent issue would need to stop the entresto .   - Her potassium is currently normal and has even been low in the past so this is surprising   -would keep 2K bath outpatient given this presentation  # Hyponatremia due to hyperglycemia (some pseudohypokalemia) and hypervolemia: on insulin , UF with HD as tolerated.  # Anemia of CKD: Hemoglobin at goal on last check, no need for ESA.  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  She had multiple stools on HD today - was given immodium x 1.    Seen and examined on dialysis.  Procedure supervised.  Blood pressure 164/65 and HR 80  Tolerating goal.  Right AVG in use.  She offers little history and per nursing report got pain meds on the floor prior to coming to HD.   Review of systems: limited secondary to patient factors - answers limited questions  Denies shortness of breath or chest pain  Denies n/v Multiple stools today as above    Objective Vital signs in last 24 hours: Vitals:   04/28/24 0855 04/28/24 0925 04/28/24 0955 04/28/24 1025  BP: (!)  143/45 (!) 159/43 (!) 166/44 115/63  Pulse: 85 88 74 78  Resp: 18 (!) 21    Temp:      TempSrc:      SpO2: 100% 100% 100% 100%  Weight:       Weight change: 0 kg  Intake/Output Summary (Last 24 hours) at 04/28/2024 1045 Last data filed at 04/27/2024 2137 Gross per 24 hour  Intake 3 ml  Output --  Net 3 ml       Labs: RENAL PANEL Recent Labs  Lab 04/23/24 0910 04/23/24 0953 04/25/24 0728 04/26/24 0429 04/26/24 1744 04/27/24 0444 04/28/24 0550  NA 113*   < > 126* 132* 133* 134* 133*  K >7.5*   < > 4.0 3.7 3.6 3.5 3.9  CL 79*   < > 88* 94* 93* 95* 95*  CO2 <7*   < > 19* 26 26 24  19*  GLUCOSE >1,200*   < > 223* 129* 85 129* 173*  BUN 88*   < > 45* 28* 30* 31* 35*  CREATININE 6.79*   < > 4.68* 3.50* 3.92* 4.27* 5.19*  CALCIUM  8.8*   < > 8.6* 7.9* 8.5* 8.6* 8.9  MG  --   --  1.8 2.0  --  2.1 2.3  PHOS  --   --  4.5  4.4 4.0  --  5.2* 6.1*  ALBUMIN  3.6  --  2.9*  --   --   --   --    < > =  values in this interval not displayed.    Liver Function Tests: Recent Labs  Lab 04/23/24 0910 04/25/24 0728  AST 59*  --   ALT 133*  --   ALKPHOS 722*  --   BILITOT 0.6  --   PROT 5.8*  --   ALBUMIN  3.6 2.9*   No results for input(s): LIPASE, AMYLASE in the last 168 hours. No results for input(s): AMMONIA in the last 168 hours. CBC: Recent Labs    11/17/23 1927 11/18/23 0630 11/18/23 0822 11/26/23 0045 04/23/24 1015 04/23/24 1016 04/23/24 1659 04/24/24 0750 04/25/24 0720  HGB 10.2* 10.9*  --    < > 12.9 12.6 11.6* 12.6 11.1*  MCV 88.0 88.4  --    < >  --   --   --  78.6* 78.4*  VITAMINB12  --  1,177*  --   --   --   --   --   --   --   FOLATE  --  12.1  --   --   --   --   --   --   --   FERRITIN  --   --  2,586*  --   --   --   --   --   --   TIBC  --   --  283  --   --   --   --   --   --   IRON   --   --  126  --   --   --   --   --   --   RETICCTPCT 2.4  --   --   --   --   --   --   --   --    < > = values in this interval not displayed.     Cardiac Enzymes: No results for input(s): CKTOTAL, CKMB, CKMBINDEX, TROPONINI in the last 168 hours. CBG: Recent Labs  Lab 04/27/24 1521 04/27/24 1826 04/27/24 1940 04/27/24 2329 04/28/24 0207  GLUCAP 99 63* 159* 177* 184*    Iron  Studies: No results for input(s): IRON , TIBC, TRANSFERRIN, FERRITIN in the last 72 hours. Studies/Results: No results found.   Medications: Infusions:  anticoagulant sodium citrate      anticoagulant sodium citrate      cefTRIAXone  (ROCEPHIN )  IV 2 g (04/27/24 1823)    Scheduled Medications:  (feeding supplement) PROSource Plus  30 mL Oral BID BM   carvedilol   25 mg Oral BID WC   Chlorhexidine  Gluconate Cloth  6 each Topical Q0600   Chlorhexidine  Gluconate Cloth  6 each Topical Q0600   feeding supplement (NEPRO CARB STEADY)  237 mL Oral BID WC   heparin   5,000 Units Subcutaneous Q8H   insulin  aspart  0-9 Units Subcutaneous TID WC   insulin  glargine  5 Units Subcutaneous Daily   lacosamide   100 mg Oral BID   lacosamide   50 mg Oral Q T,Th,Sa-HD   multivitamin  1 tablet Oral QHS   mouth rinse  15 mL Mouth Rinse 4 times per day   pantoprazole   40 mg Oral BID   sacubitril -valsartan   1 tablet Oral BID   sevelamer  carbonate  800 mg Oral TID WC   sodium chloride  flush  3 mL Intravenous Q12H    have reviewed scheduled and prn medications.  Physical Exam:      General adult female in bed in no acute distress HEENT normocephalic atraumatic keeps eyes closed during exam  Neck supple trachea midline  Lungs clear to auscultation bilaterally normal work of breathing at rest  Heart S1S2 no rub Abdomen soft nontender nondistended Extremities no edema  Psych normal mood and affect Neuro - provides her name, year, and that we are in a hospital; slightly slow to respond  Access RUE AVG in use    Juma Oxley C Ta Fair 04/28/2024,11:04 AM  LOS: 5 days

## 2024-04-28 NOTE — Progress Notes (Signed)
 Patient called this RN in her room and requested to leave AMA to go home and take care of her 33 year old child.Patient was educated on risks for leaving AMA without medically discharged by a provider. Patient stated that she understood the risks and still opted to leave AMA. On call provider was notified about patient's discission to leave. Patient did not have any acute distress and is alert and oriented and is able to make her own medical divisions. AMA paperwork was verbally read to patient, patient acknowledged and signed it. IV was taken out, patient was escorted to main entrance via wheel chair where uber was waiting for her.

## 2024-04-28 NOTE — Telephone Encounter (Signed)
 Pharmacy Patient Advocate Encounter  Insurance verification completed.    The patient is insured through [facility] [place].     Ran test claim for Valtoco  15 MG Dose and the current 30 day co-pay is $4.   This test claim was processed through [employer]- copay amounts may vary at other pharmacies due to [employer], or as the patient moves through the different stages of their insurance plan.

## 2024-04-28 NOTE — Plan of Care (Signed)
 " Problem: Education: Goal: Knowledge of General Education information will improve Description: Including pain rating scale, medication(s)/side effects and non-pharmacologic comfort measures 04/28/2024 0006 by Marvis Kenneth SAILOR, RN Outcome: Progressing 04/27/2024 2052 by Marvis Kenneth SAILOR, RN Outcome: Progressing   Problem: Health Behavior/Discharge Planning: Goal: Ability to manage health-related needs will improve 04/28/2024 0006 by Marvis Kenneth SAILOR, RN Outcome: Progressing 04/27/2024 2052 by Marvis Kenneth SAILOR, RN Outcome: Progressing   Problem: Clinical Measurements: Goal: Ability to maintain clinical measurements within normal limits will improve 04/28/2024 0006 by Marvis Kenneth SAILOR, RN Outcome: Progressing 04/27/2024 2052 by Marvis Kenneth SAILOR, RN Outcome: Progressing Goal: Will remain free from infection 04/28/2024 0006 by Marvis Kenneth SAILOR, RN Outcome: Progressing 04/27/2024 2052 by Marvis Kenneth SAILOR, RN Outcome: Progressing Goal: Diagnostic test results will improve 04/28/2024 0006 by Marvis Kenneth SAILOR, RN Outcome: Progressing 04/27/2024 2052 by Marvis Kenneth SAILOR, RN Outcome: Progressing Goal: Respiratory complications will improve 04/28/2024 0006 by Marvis Kenneth SAILOR, RN Outcome: Progressing 04/27/2024 2052 by Marvis Kenneth SAILOR, RN Outcome: Progressing Goal: Cardiovascular complication will be avoided 04/28/2024 0006 by Marvis Kenneth SAILOR, RN Outcome: Progressing 04/27/2024 2052 by Marvis Kenneth SAILOR, RN Outcome: Progressing   Problem: Activity: Goal: Risk for activity intolerance will decrease 04/28/2024 0006 by Marvis Kenneth SAILOR, RN Outcome: Progressing 04/27/2024 2052 by Marvis Kenneth SAILOR, RN Outcome: Progressing   Problem: Nutrition: Goal: Adequate nutrition will be maintained 04/28/2024 0006 by Marvis Kenneth SAILOR, RN Outcome: Progressing 04/27/2024 2052 by Marvis Kenneth SAILOR, RN Outcome: Progressing   Problem: Coping: Goal: Level  of anxiety will decrease 04/28/2024 0006 by Marvis Kenneth SAILOR, RN Outcome: Progressing 04/27/2024 2052 by Marvis Kenneth SAILOR, RN Outcome: Progressing   Problem: Elimination: Goal: Will not experience complications related to bowel motility 04/28/2024 0006 by Marvis Kenneth SAILOR, RN Outcome: Progressing 04/27/2024 2052 by Marvis Kenneth SAILOR, RN Outcome: Progressing Goal: Will not experience complications related to urinary retention 04/28/2024 0006 by Marvis Kenneth SAILOR, RN Outcome: Progressing 04/27/2024 2052 by Marvis Kenneth SAILOR, RN Outcome: Progressing   Problem: Pain Managment: Goal: General experience of comfort will improve and/or be controlled 04/28/2024 0006 by Marvis Kenneth SAILOR, RN Outcome: Progressing 04/27/2024 2052 by Marvis Kenneth SAILOR, RN Outcome: Progressing   Problem: Safety: Goal: Ability to remain free from injury will improve 04/28/2024 0006 by Marvis Kenneth SAILOR, RN Outcome: Progressing 04/27/2024 2052 by Marvis Kenneth SAILOR, RN Outcome: Progressing   Problem: Skin Integrity: Goal: Risk for impaired skin integrity will decrease 04/28/2024 0006 by Marvis Kenneth SAILOR, RN Outcome: Progressing 04/27/2024 2052 by Marvis Kenneth SAILOR, RN Outcome: Progressing   Problem: Activity: Goal: Ability to tolerate increased activity will improve 04/28/2024 0006 by Marvis Kenneth SAILOR, RN Outcome: Progressing 04/27/2024 2052 by Marvis Kenneth SAILOR, RN Outcome: Progressing   Problem: Respiratory: Goal: Ability to maintain a clear airway and adequate ventilation will improve 04/28/2024 0006 by Marvis Kenneth SAILOR, RN Outcome: Progressing 04/27/2024 2052 by Marvis Kenneth SAILOR, RN Outcome: Progressing   Problem: Role Relationship: Goal: Method of communication will improve 04/28/2024 0006 by Marvis Kenneth SAILOR, RN Outcome: Progressing 04/27/2024 2052 by Marvis Kenneth SAILOR, RN Outcome: Progressing   Problem: Safety: Goal: Non-violent Restraint(s) 04/28/2024  0006 by Marvis Kenneth SAILOR, RN Outcome: Progressing 04/27/2024 2052 by Marvis Kenneth SAILOR, RN Outcome: Progressing   Problem: Education: Goal: Ability to describe self-care measures that may prevent or decrease complications (Diabetes Survival Skills Education) will improve 04/28/2024 0006 by Marvis Kenneth SAILOR, RN Outcome: Progressing 04/27/2024 2052 by Marvis Kenneth SAILOR, RN Outcome: Progressing Goal:  Individualized Educational Video(s) 04/28/2024 0006 by Marvis Kenneth SAILOR, RN Outcome: Progressing 04/27/2024 2052 by Marvis Kenneth SAILOR, RN Outcome: Progressing   Problem: Coping: Goal: Ability to adjust to condition or change in health will improve 04/28/2024 0006 by Marvis Kenneth SAILOR, RN Outcome: Progressing 04/27/2024 2052 by Marvis Kenneth SAILOR, RN Outcome: Progressing   Problem: Fluid Volume: Goal: Ability to maintain a balanced intake and output will improve 04/28/2024 0006 by Marvis Kenneth SAILOR, RN Outcome: Progressing 04/27/2024 2052 by Marvis Kenneth SAILOR, RN Outcome: Progressing   Problem: Health Behavior/Discharge Planning: Goal: Ability to identify and utilize available resources and services will improve 04/28/2024 0006 by Marvis Kenneth SAILOR, RN Outcome: Progressing 04/27/2024 2052 by Marvis Kenneth SAILOR, RN Outcome: Progressing Goal: Ability to manage health-related needs will improve 04/28/2024 0006 by Marvis Kenneth SAILOR, RN Outcome: Progressing 04/27/2024 2052 by Marvis Kenneth SAILOR, RN Outcome: Progressing   Problem: Metabolic: Goal: Ability to maintain appropriate glucose levels will improve 04/28/2024 0006 by Marvis Kenneth SAILOR, RN Outcome: Progressing 04/27/2024 2052 by Marvis Kenneth SAILOR, RN Outcome: Progressing   Problem: Nutritional: Goal: Maintenance of adequate nutrition will improve 04/28/2024 0006 by Marvis Kenneth SAILOR, RN Outcome: Progressing 04/27/2024 2052 by Marvis Kenneth SAILOR, RN Outcome: Progressing Goal: Progress toward achieving  an optimal weight will improve 04/28/2024 0006 by Marvis Kenneth SAILOR, RN Outcome: Progressing 04/27/2024 2052 by Marvis Kenneth SAILOR, RN Outcome: Progressing   Problem: Skin Integrity: Goal: Risk for impaired skin integrity will decrease 04/28/2024 0006 by Marvis Kenneth SAILOR, RN Outcome: Progressing 04/27/2024 2052 by Marvis Kenneth SAILOR, RN Outcome: Progressing   Problem: Tissue Perfusion: Goal: Adequacy of tissue perfusion will improve 04/28/2024 0006 by Marvis Kenneth SAILOR, RN Outcome: Progressing 04/27/2024 2052 by Marvis Kenneth SAILOR, RN Outcome: Progressing   "

## 2024-04-28 NOTE — Progress Notes (Addendum)
 Subjective: No acute events over.  Undergoing dialysis this morning.  Reporting diffuse pain.  States she forgets to take medicines because she is on so many of them.  ROS: negative except above  Examination  Vital signs in last 24 hours: Temp:  [97.6 F (36.4 C)-98 F (36.7 C)] 98 F (36.7 C) (12/30 0748) Pulse Rate:  [68-88] 74 (12/30 0955) Resp:  [0-25] 21 (12/30 0925) BP: (93-166)/(43-100) 166/44 (12/30 0955) SpO2:  [93 %-100 %] 100 % (12/30 0955) Weight:  [64.2 kg-65.3 kg] 65.3 kg (12/30 0748)  General: lying in bed, NAD Neuro: MS: Alert, oriented, follows commands CN: pupils equal and reactive,  EOMI, face symmetric, tongue midline, normal sensation over face, Motor: 5/5 strength in all 4 extremities Coordination: normal Gait: not tested  Basic Metabolic Panel: Recent Labs  Lab 04/25/24 0728 04/26/24 0429 04/26/24 1744 04/27/24 0444 04/28/24 0550  NA 126* 132* 133* 134* 133*  K 4.0 3.7 3.6 3.5 3.9  CL 88* 94* 93* 95* 95*  CO2 19* 26 26 24  19*  GLUCOSE 223* 129* 85 129* 173*  BUN 45* 28* 30* 31* 35*  CREATININE 4.68* 3.50* 3.92* 4.27* 5.19*  CALCIUM  8.6* 7.9* 8.5* 8.6* 8.9  MG 1.8 2.0  --  2.1 2.3  PHOS 4.5  4.4 4.0  --  5.2* 6.1*    CBC: Recent Labs  Lab 04/23/24 0910 04/23/24 0953 04/23/24 1015 04/23/24 1016 04/23/24 1659 04/24/24 0750 04/25/24 0720  WBC 10.4  --   --   --   --  19.5* 16.4*  NEUTROABS 7.5  --   --   --   --   --   --   HGB 10.5*   < > 12.9 12.6 11.6* 12.6 11.1*  HCT 39.5   < > 38.0 37.0 34.0* 36.7 33.1*  MCV 100.0  --   --   --   --  78.6* 78.4*  PLT 222  --   --   --   --  222 197   < > = values in this interval not displayed.     Coagulation Studies: No results for input(s): LABPROT, INR in the last 72 hours.  Imaging No new brain imaging overnight   ASSESSMENT AND PLAN: 33 year old female with poorly controlled type 1 diabetes, epilepsy who presented with breakthrough seizures in the setting of DKA and medication  noncompliance.  Epilepsy with breakthrough seizure ESRD on HD - Patient has been on Keppra  and lamotrigine  in the past.  Unfortunately had to switch to Vimpat  during this admission due to noncompliance - Currently on Vimpat  100 mg twice daily with additional 50 mg dose after dialysis - Rescue medication: Valtoco  15mg  for sz lasting over 2 mins  - Counseled patient regarding importance of medication compliance - Patient reports significant burden of medications daily.  If compliance remains an issue, can consider switching to [facility] which potentially can be used just once daily without additional dose after dialysis if tolerated - Continue seizure precautions - F/u with Neuro in 2-3 months  Seizure precautions: Per [place]  DMV statutes, patients with seizures are not allowed to drive until they have been seizure-free for six months and cleared by a physician    Use caution when using heavy equipment or power tools. Avoid working on ladders or at heights. Take showers instead of baths. Ensure the water  temperature is not too high on the home water  heater. Do not go swimming alone. Do not lock yourself in a room alone (i.e.  bathroom). When caring for infants or small children, sit down when holding, feeding, or changing them to minimize risk of injury to the child in the event you have a seizure. Maintain good sleep hygiene. Avoid alcohol .    If patient has another seizure, call 911 and bring them back to the ED if: A.  The seizure lasts longer than 5 minutes.      B.  The patient doesn't wake shortly after the seizure or has new problems such as difficulty seeing, speaking or moving following the seizure C.  The patient was injured during the seizure D.  The patient has a temperature over 102 F (39C) E.  The patient vomited during the seizure and now is having trouble breathing    During the Seizure   - First, ensure adequate ventilation and place patients on the floor on their left side   Loosen clothing around the neck and ensure the airway is patent. If the patient is clenching the teeth, do not force the mouth open with any object as this can cause severe damage - Remove all items from the surrounding that can be hazardous. The patient may be oblivious to what's happening and may not even know what he or she is doing. If the patient is confused and wandering, either gently guide him/her away and block access to outside areas - Reassure the individual and be comforting - Call 911. In most cases, the seizure ends before EMS arrives. However, there are cases when seizures may last over 3 to 5 minutes. Or the individual may have developed breathing difficulties or severe injuries. If a pregnant patient or a person with diabetes develops a seizure, it is prudent to call an ambulance.    After the Seizure (Postictal Stage)   After a seizure, most patients experience confusion, fatigue, muscle pain and/or a headache. Thus, one should permit the individual to sleep. For the next few days, reassurance is essential. Being calm and helping reorient the person is also of importance.   Most seizures are painless and end spontaneously. Seizures are not harmful to others but can lead to complications such as stress on the lungs, brain and the heart. Individuals with prior lung problems may develop labored breathing and respiratory distress.       I personally spent a total of 36 minutes in the care of the patient today including getting/reviewing separately obtained history, performing a medically appropriate exam/evaluation, counseling and educating, placing orders, referring and communicating with other health care professionals, documenting clinical information in the EHR, independently interpreting results, and coordinating care.       [facility] For questions after 5pm please refer to AMION to reach the Neurologist on call

## 2024-04-28 NOTE — Plan of Care (Signed)
" °  Problem: Education: Goal: Knowledge of General Education information will improve Description: Including pain rating scale, medication(s)/side effects and non-pharmacologic comfort measures Outcome: Not Progressing   Problem: Health Behavior/Discharge Planning: Goal: Ability to manage health-related needs will improve Outcome: Not Progressing   Problem: Clinical Measurements: Goal: Ability to maintain clinical measurements within normal limits will improve Outcome: Progressing Goal: Will remain free from infection Outcome: Progressing Goal: Diagnostic test results will improve Outcome: Progressing Goal: Respiratory complications will improve Outcome: Progressing Goal: Cardiovascular complication will be avoided Outcome: Progressing   Problem: Nutrition: Goal: Adequate nutrition will be maintained Outcome: Progressing   Problem: Activity: Goal: Risk for activity intolerance will decrease Outcome: Progressing   Problem: Elimination: Goal: Will not experience complications related to bowel motility Outcome: Progressing Goal: Will not experience complications related to urinary retention Outcome: Progressing   Problem: Coping: Goal: Level of anxiety will decrease Outcome: Progressing   Problem: Skin Integrity: Goal: Risk for impaired skin integrity will decrease Outcome: Progressing   Problem: Activity: Goal: Ability to tolerate increased activity will improve Outcome: Progressing   Problem: Respiratory: Goal: Ability to maintain a clear airway and adequate ventilation will improve Outcome: Progressing   Problem: Education: Goal: Ability to describe self-care measures that may prevent or decrease complications (Diabetes Survival Skills Education) will improve Outcome: Not Progressing Goal: Individualized Educational Video(s) Outcome: Progressing   Problem: Safety: Goal: Non-violent Restraint(s) Outcome: Progressing   Problem: Coping: Goal: Ability to adjust  to condition or change in health will improve Outcome: Progressing   Problem: Fluid Volume: Goal: Ability to maintain a balanced intake and output will improve Outcome: Progressing   Problem: Skin Integrity: Goal: Risk for impaired skin integrity will decrease Outcome: Progressing   Problem: Nutritional: Goal: Maintenance of adequate nutrition will improve Outcome: Progressing Goal: Progress toward achieving an optimal weight will improve Outcome: Progressing   Problem: Tissue Perfusion: Goal: Adequacy of tissue perfusion will improve Outcome: Progressing   "

## 2024-04-28 NOTE — Progress Notes (Signed)
 [facility] Progress Note  Patient: Ashley Freeman    FMW:[id number]  DOA: 04/23/2024     Date of Service: the patient was seen and examined on 04/28/2024  Chief Complaint  Patient presents with   Hyperglycemia   Fatigue   Brief hospital course: 33 year old female with below PMHx with recent hospitalization for seizure from 12/15-12/20 and prior to this DKA on 12/10. She presented on 12/25 for general pain, weakness and malaise. Patient reported as poor historian. Labs significant for Na 113, K >7.5 CO2 <7 glucose 1200, elevated LFTs. She was started on insulin  gtt and IVF for DKA. VBG with pH 7.185. Nephrology consulted for HD. PCCM consulted for worsening mental status with facial twitching, initially protecting airway. On exam patient had emesis and not protecting airway so intubated in the ED.   Assessment and Plan:  # Presumed seizures: Description of seizure-like activity on admission.  Likely due to hyperglycemia, DKA. -- Continue AEDs per neurology, no seizures demonstrated on EEG, EEG d/c'd   # Acute metabolic encephalopathy: Due to hypoglycemia, postictal state etc.  Seemingly resolved to improved.  Now agitated. -- slowly improving appears close to baseline   Acute hypoxemic respiratory failure due to presumed pneumonia as well as now volume overload with fluid resuscitation and DKA. -- 7-day ceftriaxone  ordered for pneumonia, extended with burden of mucous -- IHD Tuesday Thursday Saturday, received 12/27 -- extubated 12/28, goal sat 90%, wean O2 as able 12/30 currently saturating well on room air.  Respiratory failure resolved   Hypotension: Likely related to volume depletion, hypovolemia in the setting of DKA.  Now resolved.   Hypertension Continue current medications Monitor BP and titrate medications accordingly  Cardiomyopathy: --Entresto , coreg  resumed 12/28   Type 1 diabetes with hyperglycemia, DKA: Now anion gap closed. Sugars up and down.   --Continue subcutaneous insulin    End-stage renal disease: --Tuesday, Thursday, Saturday dialysis Nephrology following  Body mass index is 25.5 kg/m.  Nutrition Problem: Increased nutrient needs Etiology: chronic illness (ESRD on HD) Interventions: Interventions: Nepro shake, MVI   Diet: Renal/carb modified diet DVT Prophylaxis: Subcutaneous Heparin     Advance goals of care discussion: Full code  Family Communication: family was not present at bedside, at the time of interview.  The pt provided permission to discuss medical plan with the family. Opportunity was given to ask question and all questions were answered satisfactorily.   Disposition:  Pt is from Home, admitted with DKA, seizures, and respiratory failure, patient was intubated in the ED, admitted under PCCM, transition to TRH on 12/30.  Hypotension resolved, respiratory failure resolved currently saturating well on room air.  Now she is very hypertensive, which precludes a safe discharge. Discharge to home, when stable, most likely in 1 to 2 days.  Subjective: No significant events overnight.  Patient was seen after hemodialysis, tolerated well.  Complaining of generalized body ache, no any other specific complaints.  Physical Exam: General: NAD, lying comfortably Appear in no distress, affect appropriate Eyes: PERRLA ENT: Oral Mucosa Clear, moist  Neck: no JVD,  Cardiovascular: S1 and S2 Present, no Murmur,  Respiratory: good respiratory effort, Bilateral Air entry equal and Decreased, no Crackles, no wheezes Abdomen: Bowel Sound present, Soft and no tenderness,  Skin: no rashes Extremities: no Pedal edema, no calf tenderness Neurologic: without any new focal findings Gait not checked due to patient safety concerns  Vitals:   04/28/24 0450 04/28/24 0748 04/28/24 0811 04/28/24 0825  BP:  (!) 156/54 (!) 151/54 ROLLEN)  165/70  Pulse:  75 68 72  Resp:  (!) 23 (!) 0 19  Temp:  98 F (36.7 C)    TempSrc:       SpO2:  93% 100% 100%  Weight: 64.2 kg 65.3 kg      Intake/Output Summary (Last 24 hours) at 04/28/2024 0856 Last data filed at 04/27/2024 2137 Gross per 24 hour  Intake 6 ml  Output --  Net 6 ml   Filed Weights   04/27/24 0500 04/28/24 0450 04/28/24 0748  Weight: 64.2 kg 64.2 kg 65.3 kg    Data Reviewed: I have personally reviewed and interpreted daily labs, tele strips, imagings as discussed above. I reviewed all nursing notes, pharmacy notes, vitals, pertinent old records I have discussed plan of care as described above with RN and patient/family.  CBC: Recent Labs  Lab 04/23/24 0910 04/23/24 0953 04/23/24 1015 04/23/24 1016 04/23/24 1659 04/24/24 0750 04/25/24 0720  WBC 10.4  --   --   --   --  19.5* 16.4*  NEUTROABS 7.5  --   --   --   --   --   --   HGB 10.5*   < > 12.9 12.6 11.6* 12.6 11.1*  HCT 39.5   < > 38.0 37.0 34.0* 36.7 33.1*  MCV 100.0  --   --   --   --  78.6* 78.4*  PLT 222  --   --   --   --  222 197   < > = values in this interval not displayed.   Basic Metabolic Panel: Recent Labs  Lab 04/25/24 0728 04/26/24 0429 04/26/24 1744 04/27/24 0444 04/28/24 0550  NA 126* 132* 133* 134* 133*  K 4.0 3.7 3.6 3.5 3.9  CL 88* 94* 93* 95* 95*  CO2 19* 26 26 24  19*  GLUCOSE 223* 129* 85 129* 173*  BUN 45* 28* 30* 31* 35*  CREATININE 4.68* 3.50* 3.92* 4.27* 5.19*  CALCIUM  8.6* 7.9* 8.5* 8.6* 8.9  MG 1.8 2.0  --  2.1 2.3  PHOS 4.5  4.4 4.0  --  5.2* 6.1*    Studies: No results found.  Scheduled Meds:  (feeding supplement) PROSource Plus  30 mL Oral BID BM   carvedilol   25 mg Oral BID WC   Chlorhexidine  Gluconate Cloth  6 each Topical Q0600   Chlorhexidine  Gluconate Cloth  6 each Topical Q0600   feeding supplement (NEPRO CARB STEADY)  237 mL Oral BID WC   heparin   5,000 Units Subcutaneous Q8H   insulin  aspart  0-9 Units Subcutaneous TID WC   insulin  glargine  5 Units Subcutaneous Daily   lacosamide   100 mg Oral BID   multivitamin  1 tablet  Oral QHS   mouth rinse  15 mL Mouth Rinse 4 times per day   pantoprazole   40 mg Oral BID   sacubitril -valsartan   1 tablet Oral BID   sevelamer  carbonate  800 mg Oral TID WC   sodium chloride  flush  3 mL Intravenous Q12H   Continuous Infusions:  anticoagulant sodium citrate      anticoagulant sodium citrate      cefTRIAXone  (ROCEPHIN )  IV 2 g (04/27/24 1823)   PRN Meds: acetaminophen  **OR** acetaminophen , albuterol , alteplase , anticoagulant sodium citrate , anticoagulant sodium citrate , dextrose , heparin , heparin , heparin , hydrALAZINE , hydrOXYzine , lidocaine  (PF), lidocaine  (PF), lidocaine -prilocaine , lidocaine -prilocaine , ondansetron  (ZOFRAN ) IV, mouth rinse, oxyCODONE , pentafluoroprop-tetrafluoroeth, pentafluoroprop-tetrafluoroeth, trimethobenzamide   Time spent: 35 minutes  Author: ELVAN SOR. MD [facility] 04/28/2024 8:56 AM  To reach On-call,  see care teams to locate the attending and reach out to them via [email or web address]. If 7PM-7AM, please contact night-coverage If you still have difficulty reaching the attending provider, please page the [facility] (Director on Call) for [facility] on amion for assistance.

## 2024-04-28 NOTE — Progress Notes (Signed)
 Patient reported difficulty breathing. SpO2 98 on room air. Head of bed elevated. Breathing treatment administered. Patient stated face mask was blowing too hard. O2 rate lowered from 15L/min to 10L/min. Patient again refused to accept breathing treatment. 2L/O2 applied by nasal cannula. Even reported off to oncoming night shift.

## 2024-04-28 NOTE — Progress Notes (Signed)
"                                                                                  ° ° ° °                                               Against Medical Advice Patient at this time expresses desire to leave the Hospital immediately, patient has been warned that this is not Medically advisable at this time, and can result in Medical complications like Death and Disability, patient understands and accepts the risks involved and assumes full responsibilty of this decision.  This patient has also been advised that if they feel the need for further medical assistance to return to any available ER or dial  [phone].  Informed by Nursing staff that this patient has left care and has signed the form  Against Medical Advice on 04/28/2024 at 2012 Hrs  Lynwood Kipper BSN MSNA MSN ACNPC-AG Acute Care Nurse Practitioner [facility] [place]    "

## 2024-04-28 NOTE — Progress Notes (Signed)
 Came in sleepy but alert and oriented with no complaints Consent verified and on file. Started with no complaints  UF goal: Tx duration: 3.5 hours  Access used: Right AVG Access issue: Used needle 16g due to access is a little bit small.  Pt. Having a loose bowel movement. See MAR  Tx completed and tolerated. Pressure dressing applied Endorsed to floor nurse. Transported to room.  Tiffancy Moger Rubi Jennifer Holland, RN [facility]

## 2024-04-28 NOTE — Progress Notes (Signed)
 PT Cancellation Note  Patient Details Name: Ashley Freeman MRN: [id number] DOB: 1991-02-15   Cancelled Treatment:    Reason Eval/Treat Not Completed: Patient at procedure or test/unavailable. Pt in HD.   [name] 04/28/2024, 8:49 AM

## 2024-04-29 ENCOUNTER — Other Ambulatory Visit (HOSPITAL_COMMUNITY): Payer: Self-pay

## 2024-04-29 LAB — CULTURE, BLOOD (ROUTINE X 2)
Culture: NO GROWTH
Culture: NO GROWTH

## 2024-04-29 NOTE — Progress Notes (Signed)
 Late Note Entry- Apr 29, 2024  Noted pt left AMA last evening. Contacted [facility] this morning to be made aware of this information. Will fax d/c summary and last renal note to clinic for continuation of care once d/c summary has been completed. Pt's clinic is running on normal schedule this week and plans to call pt to make her aware that she can treat on Thursday.   Randine Mungo Dialysis Navigator [phone]

## 2024-04-29 NOTE — Care Plan (Signed)
 Transition of Care Encounter Data   Call attempt: 1 Admission date: 04/23/24 Discharge date: 04/28/24 Discharge diagnosis: DKA (diabetic ketoacidosis) Do you have a hospital follow up appointment?: Yes - After 14 Days, Yes with Specialty Provider clinic F/U Date: 05/21/24 F/U Provider: Linward Aloha Sensing, MD Patient post discharge: Medications:         [facility]: [phone] [place]: [phone]:    Other: Contact PCP:       [facility] TRANSITIONAL CASE MANAGEMENT SUMMARY NOTE   Attempted to contact patient today at Cell to complete Transitional Case Management call from [facility]. Left message for patient to return call; direct phone number included in message left for patient; 1st attempt.           Camie LITTIE Moll, RN

## 2024-04-30 ENCOUNTER — Other Ambulatory Visit: Payer: Self-pay

## 2024-04-30 ENCOUNTER — Encounter (HOSPITAL_COMMUNITY): Payer: Self-pay

## 2024-04-30 ENCOUNTER — Emergency Department (HOSPITAL_COMMUNITY)

## 2024-04-30 ENCOUNTER — Inpatient Hospital Stay (HOSPITAL_COMMUNITY)
Admission: EM | Admit: 2024-04-30 | Discharge: 2024-05-04 | DRG: 637 | Disposition: A | Discharge status: Home / Self Care | Attending: Internal Medicine | Admitting: Internal Medicine

## 2024-04-30 DIAGNOSIS — Z91158 Patient's noncompliance with renal dialysis for other reason: Secondary | ICD-10-CM

## 2024-04-30 DIAGNOSIS — F319 Bipolar disorder, unspecified: Secondary | ICD-10-CM | POA: Diagnosis not present

## 2024-04-30 DIAGNOSIS — Z79899 Other long term (current) drug therapy: Secondary | ICD-10-CM

## 2024-04-30 DIAGNOSIS — N186 End stage renal disease: Secondary | ICD-10-CM

## 2024-04-30 DIAGNOSIS — E10649 Type 1 diabetes mellitus with hypoglycemia without coma: Secondary | ICD-10-CM | POA: Diagnosis present

## 2024-04-30 DIAGNOSIS — I5042 Chronic combined systolic (congestive) and diastolic (congestive) heart failure: Secondary | ICD-10-CM | POA: Diagnosis present

## 2024-04-30 DIAGNOSIS — R531 Weakness: Secondary | ICD-10-CM

## 2024-04-30 DIAGNOSIS — Z881 Allergy status to other antibiotic agents status: Secondary | ICD-10-CM

## 2024-04-30 DIAGNOSIS — E1110 Type 2 diabetes mellitus with ketoacidosis without coma: Secondary | ICD-10-CM | POA: Diagnosis present

## 2024-04-30 DIAGNOSIS — Z8269 Family history of other diseases of the musculoskeletal system and connective tissue: Secondary | ICD-10-CM

## 2024-04-30 DIAGNOSIS — D631 Anemia in chronic kidney disease: Secondary | ICD-10-CM | POA: Diagnosis present

## 2024-04-30 DIAGNOSIS — Z880 Allergy status to penicillin: Secondary | ICD-10-CM

## 2024-04-30 DIAGNOSIS — T783XXA Angioneurotic edema, initial encounter: Secondary | ICD-10-CM | POA: Diagnosis present

## 2024-04-30 DIAGNOSIS — E1010 Type 1 diabetes mellitus with ketoacidosis without coma: Secondary | ICD-10-CM | POA: Diagnosis not present

## 2024-04-30 DIAGNOSIS — Z992 Dependence on renal dialysis: Secondary | ICD-10-CM

## 2024-04-30 DIAGNOSIS — I871 Compression of vein: Secondary | ICD-10-CM | POA: Diagnosis present

## 2024-04-30 DIAGNOSIS — Z885 Allergy status to narcotic agent status: Secondary | ICD-10-CM

## 2024-04-30 DIAGNOSIS — E785 Hyperlipidemia, unspecified: Secondary | ICD-10-CM | POA: Diagnosis present

## 2024-04-30 DIAGNOSIS — I428 Other cardiomyopathies: Secondary | ICD-10-CM | POA: Diagnosis present

## 2024-04-30 DIAGNOSIS — Z888 Allergy status to other drugs, medicaments and biological substances status: Secondary | ICD-10-CM

## 2024-04-30 DIAGNOSIS — Z833 Family history of diabetes mellitus: Secondary | ICD-10-CM

## 2024-04-30 DIAGNOSIS — Z862 Personal history of diseases of the blood and blood-forming organs and certain disorders involving the immune mechanism: Secondary | ICD-10-CM | POA: Diagnosis not present

## 2024-04-30 DIAGNOSIS — Z91148 Patient's other noncompliance with medication regimen for other reason: Secondary | ICD-10-CM

## 2024-04-30 DIAGNOSIS — N189 Chronic kidney disease, unspecified: Secondary | ICD-10-CM | POA: Diagnosis not present

## 2024-04-30 DIAGNOSIS — N2581 Secondary hyperparathyroidism of renal origin: Secondary | ICD-10-CM | POA: Diagnosis present

## 2024-04-30 DIAGNOSIS — Z825 Family history of asthma and other chronic lower respiratory diseases: Secondary | ICD-10-CM

## 2024-04-30 DIAGNOSIS — E1022 Type 1 diabetes mellitus with diabetic chronic kidney disease: Secondary | ICD-10-CM | POA: Diagnosis present

## 2024-04-30 DIAGNOSIS — E875 Hyperkalemia: Secondary | ICD-10-CM | POA: Diagnosis present

## 2024-04-30 DIAGNOSIS — Z794 Long term (current) use of insulin: Secondary | ICD-10-CM

## 2024-04-30 DIAGNOSIS — G40909 Epilepsy, unspecified, not intractable, without status epilepticus: Secondary | ICD-10-CM | POA: Diagnosis present

## 2024-04-30 DIAGNOSIS — G928 Other toxic encephalopathy: Secondary | ICD-10-CM | POA: Diagnosis present

## 2024-04-30 DIAGNOSIS — I132 Hypertensive heart and chronic kidney disease with heart failure and with stage 5 chronic kidney disease, or end stage renal disease: Secondary | ICD-10-CM | POA: Diagnosis present

## 2024-04-30 LAB — I-STAT VENOUS BLOOD GAS, ED
Acid-base deficit: 18 mmol/L — ABNORMAL HIGH (ref 0.0–2.0)
Bicarbonate: 8.5 mmol/L — ABNORMAL LOW (ref 20.0–28.0)
Calcium, Ion: 1.12 mmol/L — ABNORMAL LOW (ref 1.15–1.40)
HCT: 35 % — ABNORMAL LOW (ref 36.0–46.0)
Hemoglobin: 11.9 g/dL — ABNORMAL LOW (ref 12.0–15.0)
O2 Saturation: 95 %
Potassium: 4.7 mmol/L (ref 3.5–5.1)
Sodium: 116 mmol/L — CL (ref 135–145)
TCO2: 9 mmol/L — ABNORMAL LOW (ref 22–32)
pCO2, Ven: 23.8 mmHg — ABNORMAL LOW (ref 44–60)
pH, Ven: 7.163 — CL (ref 7.25–7.43)
pO2, Ven: 95 mmHg — ABNORMAL HIGH (ref 32–45)

## 2024-04-30 LAB — CBC WITH DIFFERENTIAL/PLATELET
Abs Immature Granulocytes: 0.12 K/uL — ABNORMAL HIGH (ref 0.00–0.07)
Basophils Absolute: 0.1 K/uL (ref 0.0–0.1)
Basophils Relative: 1 %
Eosinophils Absolute: 0.2 K/uL (ref 0.0–0.5)
Eosinophils Relative: 2 %
HCT: 35.8 % — ABNORMAL LOW (ref 36.0–46.0)
Hemoglobin: 9.5 g/dL — ABNORMAL LOW (ref 12.0–15.0)
Immature Granulocytes: 2 %
Lymphocytes Relative: 32 %
Lymphs Abs: 2.4 K/uL (ref 0.7–4.0)
MCH: 26.5 pg (ref 26.0–34.0)
MCHC: 26.5 g/dL — ABNORMAL LOW (ref 30.0–36.0)
MCV: 100 fL (ref 80.0–100.0)
Monocytes Absolute: 1 K/uL (ref 0.1–1.0)
Monocytes Relative: 14 %
Neutro Abs: 3.8 K/uL (ref 1.7–7.7)
Neutrophils Relative %: 49 %
Platelets: 284 K/uL (ref 150–400)
RBC: 3.58 MIL/uL — ABNORMAL LOW (ref 3.87–5.11)
RDW: 14.8 % (ref 11.5–15.5)
WBC: 7.5 K/uL (ref 4.0–10.5)
nRBC: 0 % (ref 0.0–0.2)

## 2024-04-30 LAB — I-STAT CHEM 8, ED
BUN: 35 mg/dL — ABNORMAL HIGH (ref 6–20)
Calcium, Ion: 1.14 mmol/L — ABNORMAL LOW (ref 1.15–1.40)
Chloride: 88 mmol/L — ABNORMAL LOW (ref 98–111)
Creatinine, Ser: 5.3 mg/dL — ABNORMAL HIGH (ref 0.44–1.00)
Glucose, Bld: >700 mg/dL (ref 70–99)
HCT: 35 % — ABNORMAL LOW (ref 36.0–46.0)
Hemoglobin: 11.9 g/dL — ABNORMAL LOW (ref 12.0–15.0)
Potassium: 4.8 mmol/L (ref 3.5–5.1)
Sodium: 117 mmol/L — CL (ref 135–145)
TCO2: 10 mmol/L — ABNORMAL LOW (ref 22–32)

## 2024-04-30 LAB — BASIC METABOLIC PANEL WITH GFR
BUN: 40 mg/dL — ABNORMAL HIGH (ref 6–20)
CO2: <7 mmol/L — ABNORMAL LOW (ref 22–32)
Calcium: 8.7 mg/dL — ABNORMAL LOW (ref 8.9–10.3)
Chloride: 78 mmol/L — ABNORMAL LOW (ref 98–111)
Creatinine, Ser: 5.3 mg/dL — ABNORMAL HIGH (ref 0.44–1.00)
GFR, Estimated: 10 mL/min — ABNORMAL LOW
Glucose, Bld: >1200 mg/dL (ref 70–99)
Potassium: 5.2 mmol/L — ABNORMAL HIGH (ref 3.5–5.1)
Sodium: 117 mmol/L — CL (ref 135–145)

## 2024-04-30 LAB — CBG MONITORING, ED: Glucose-Capillary: >600 mg/dL (ref 70–99)

## 2024-04-30 LAB — HCG, SERUM, QUALITATIVE: Preg, Serum: NEGATIVE

## 2024-04-30 MED ORDER — INSULIN REGULAR(HUMAN) IN NACL 100-0.9 UT/100ML-% IV SOLN
INTRAVENOUS | Status: DC
  Administered 2024-05-01: 0.9 [IU]/h via INTRAVENOUS
  Administered 2024-05-01: 5.5 [IU]/h via INTRAVENOUS
  Administered 2024-05-02: 1 [IU]/h via INTRAVENOUS
  Filled 2024-04-30 (×2): qty 100

## 2024-04-30 MED ORDER — DEXTROSE IN LACTATED RINGERS 5 % IV SOLN: INTRAVENOUS | Status: AC

## 2024-04-30 MED ORDER — MELATONIN 3 MG PO TABS: 3.0000 mg | ORAL_TABLET | Freq: Every evening | ORAL | Status: DC | PRN

## 2024-04-30 MED ORDER — POTASSIUM CHLORIDE 10 MEQ/100ML IV SOLN: 10.0000 meq | INTRAVENOUS | Status: DC

## 2024-04-30 MED ORDER — SODIUM CHLORIDE 0.9 % IV BOLUS
500.0000 mL | Freq: Once | INTRAVENOUS | Status: AC
  Administered 2024-04-30: 500 mL via INTRAVENOUS

## 2024-04-30 MED ORDER — ACETAMINOPHEN 650 MG RE SUPP: 650.0000 mg | Freq: Four times a day (QID) | RECTAL | Status: DC | PRN

## 2024-04-30 MED ORDER — ACETAMINOPHEN 325 MG PO TABS
650.0000 mg | ORAL_TABLET | Freq: Four times a day (QID) | ORAL | Status: DC | PRN
  Administered 2024-05-03 – 2024-05-04 (×2): 650 mg via ORAL
  Filled 2024-04-30 (×3): qty 2

## 2024-04-30 MED ORDER — LACTATED RINGERS IV SOLN: INTRAVENOUS | Status: DC

## 2024-04-30 MED ORDER — FENTANYL CITRATE (PF) 50 MCG/ML IJ SOSY
12.5000 ug | PREFILLED_SYRINGE | INTRAMUSCULAR | Status: DC | PRN
  Administered 2024-05-01 (×5): 12.5 ug via INTRAVENOUS
  Filled 2024-04-30 (×5): qty 1

## 2024-04-30 MED ORDER — DEXTROSE 50 % IV SOLN: 0.0000 mL | INTRAVENOUS | Status: DC | PRN

## 2024-04-30 MED ORDER — FENTANYL CITRATE (PF) 50 MCG/ML IJ SOSY
50.0000 ug | PREFILLED_SYRINGE | Freq: Once | INTRAMUSCULAR | Status: AC
  Administered 2024-04-30: 50 ug via INTRAVENOUS
  Filled 2024-04-30: qty 1

## 2024-04-30 MED ORDER — ONDANSETRON HCL 4 MG/2ML IJ SOLN: 4.0000 mg | Freq: Four times a day (QID) | INTRAMUSCULAR | Status: DC | PRN

## 2024-04-30 NOTE — ED Triage Notes (Addendum)
 Pt BIB [facility] from home c/o hyperglycemia. EMS reports pt discharged yesterday with pneumonia. Tachypnea, angioedema to the face. A&Ox4. Pt states last dialysis 04/24/2024.   94% 02 RA  30-40 RR  90 HR  Sinus rhythm

## 2024-04-30 NOTE — ED Notes (Signed)
 Attempted to collect urine sample from pt. Patient stated she is unable to provide sample at this time.

## 2024-04-30 NOTE — ED Provider Notes (Signed)
 " [place] [facility] AT [facility] Provider Note   CSN: [id number] Arrival date & time: 04/30/24  1924     Patient presents with: Hyperglycemia   Ashley Freeman is a 34 y.o. female.   Patient is a 34 year old with a history of diabetes, end-stage renal disease on dialysis Tuesday Thursday Saturday, hypertension, bipolar disorder, nonischemic cardiomyopathy who presents with weakness.  She feels weak and has pain all over.  She denies any fevers.  She denies any significant coughing but she does feel short of breath.  She reports that she last went to dialysis 2 days ago.  She denies any vomiting.  She was recently admitted to the hospital for DKA.  She also had had some seizure-like activity.  She was last dialyzed in the hospital on December 27.  She did require intubation after she had some vomiting and was not protecting her airway.  She ended up leaving AMA on December 30.       Prior to Admission medications  Medication Sig Start Date End Date Taking? Authorizing Provider  albuterol  (VENTOLIN  HFA) 108 (90 Base) MCG/ACT inhaler Inhale 2 puffs into the lungs every 4 (four) hours as needed for wheezing or shortness of breath. 11/24/22   [provider]  bumetanide  (BUMEX ) 2 MG tablet Take 10 mg by mouth daily.    [provider]  carvedilol  (COREG ) 25 MG tablet Take 1 tablet (25 mg total) by mouth 2 (two) times daily with a meal. Follow with your PCP for refills. 04/18/24 05/18/24  Perri DELENA Meliton Mickey., MD  Cinnamon 500 MG capsule Take 500 mg by mouth in the morning.    [provider]  diazePAM , 15 MG Dose, 2 x 7.5 MG/0.1ML LQPK Place 15 mg into the nose as needed (sz lasting over 2 minutes). 04/28/24   Yadav, Priyanka O, MD  DULoxetine  (CYMBALTA ) 20 MG capsule Take 20 mg by mouth daily. 07/29/23   [provider]  Elderberry-Vitamin C-Zinc  (ELDERBERRY IMMUNE HEALTH GUMMY PO) Take 2 each by mouth in the morning.     [provider]  fluticasone  (FLONASE ) 50 MCG/ACT nasal spray Place 2 sprays into both nostrils daily as needed for allergies or rhinitis. 12/11/23 12/10/24  [provider]  hydrOXYzine  (ATARAX ) 25 MG tablet Take 25 mg by mouth 3 (three) times daily as needed for anxiety, itching, nausea or vomiting.    [provider]  lamoTRIgine  (LAMICTAL ) 25 MG tablet Take 25 mg (1 tablet) daily for 10 days.   Then take 50 mg (2 tablets) daily for 14 days.   Then take 100 mg (4 tablets) daily for 7 days.   Then take 100 mg (4 tablets) in the morning and 25 mg (1 tablet) at night for 7 days.   Then take 100 mg (4 tablets) in the morning and 50 mg (2 tablets) at night for 7 days.   Then take 100 mg (4 tablets) twice a day.  Continue this dose.  Follow with your PCP for refills. 04/18/24   Perri DELENA Meliton Mickey., MD  LANTUS  SOLOSTAR 100 UNIT/ML Solostar Pen Inject 5 Units into the skin daily. 04/18/24   Perri DELENA Meliton Mickey., MD  levETIRAcetam  (KEPPRA ) 250 MG tablet Take 2 tablets (500 mg total) by mouth every morning AND 1 tablet (250 mg total) every Tuesday, Thursday, and Saturday at 6 PM. (After dialysis). 04/18/24 05/30/24  Perri DELENA Meliton Mickey., MD  NOVOLOG  FLEXPEN 100 UNIT/ML FlexPen Before each meal 3  times a day, 140-199 - 2 units, 200-250 - 4 units, 251-299 - 6 units,  300-349 - 7 units,  350 or above 8 units. 03/30/24   Dennise Lavada POUR, MD  OLANZapine  (ZYPREXA ) 5 MG tablet Take 5 mg by mouth at bedtime.    [provider]  Pancrelipase , Lip-Prot-Amyl, 3000-9500 units CPEP Take 3,000 Units by mouth in the morning, at noon, and at bedtime.    [provider]  pantoprazole  (PROTONIX ) 40 MG tablet Take 1 tablet (40 mg total) by mouth 2 (two) times daily. 02/07/24 04/10/24  Drusilla Sabas RAMAN, MD  prochlorperazine  (COMPAZINE ) 5 MG tablet Take 5 mg by mouth every 6 (six) hours as needed for nausea or vomiting.    [provider]  [Paused] rosuvastatin  (CRESTOR )  40 MG tablet Take 40 mg by mouth daily. Wait to take this until your doctor or other care provider tells you to start again. 01/20/24 01/19/25  [provider]  sacubitril -valsartan  (ENTRESTO ) 24-26 MG Take 1 tablet by mouth 2 (two) times daily. Follow with your PCP/outpatient provider for refills 04/18/24 05/18/24  Perri DELENA Meliton Mickey., MD  sevelamer  carbonate (RENVELA ) 800 MG tablet Take 1 tablet (800 mg total) by mouth 3 (three) times daily with meals. 02/07/24   Drusilla Sabas RAMAN, MD  sodium bicarbonate  650 MG tablet Take 650 mg by mouth 2 (two) times daily. Patient not taking: Reported on 04/07/2024 07/29/23   [provider]  SUMAtriptan  (IMITREX ) 50 MG tablet Take 50 mg by mouth every 2 (two) hours as needed for migraine or headache. 06/20/22   [provider]  VELTASSA  16.8 g PACK Take 1 Dose by mouth daily at 12 noon. 04/03/24   [provider]    Allergies: Keflex  [cephalexin ], Penicillins, Vibramycin  [doxycycline ], Benadryl  [diphenhydramine ], Dilaudid  [hydromorphone ], Methotrexate and trimetrexate, and Roxicodone  [oxycodone ]    Review of Systems  Constitutional:  Positive for fatigue. Negative for chills, diaphoresis and fever.  HENT:  Negative for congestion, rhinorrhea and sneezing.   Eyes: Negative.   Respiratory:  Positive for shortness of breath. Negative for cough and chest tightness.   Cardiovascular:  Negative for chest pain and leg swelling.  Gastrointestinal:  Negative for abdominal pain, diarrhea, nausea and vomiting.  Genitourinary:  Negative for difficulty urinating, flank pain and frequency.  Musculoskeletal:  Positive for myalgias (Pain all over). Negative for arthralgias and back pain.  Skin:  Negative for rash.  Neurological:  Negative for dizziness, speech difficulty, weakness, numbness and headaches.    Updated Vital Signs BP (!) 160/82   Pulse 86   Temp 97.7 F (36.5 C) (Oral)   Resp 19   Ht 5' 3 (1.6 m)   Wt 59.4 kg   SpO2  100%   BMI 23.20 kg/m   Physical Exam Constitutional:      Appearance: She is well-developed. She is ill-appearing.     Comments: Smells of ketones  HENT:     Head: Normocephalic and atraumatic.  Eyes:     Pupils: Pupils are equal, round, and reactive to light.  Cardiovascular:     Rate and Rhythm: Normal rate and regular rhythm.     Heart sounds: Normal heart sounds.  Pulmonary:     Effort: Pulmonary effort is normal. Tachypnea present. No respiratory distress.     Breath sounds: Normal breath sounds. No wheezing or rales.  Chest:     Chest wall: No tenderness.  Abdominal:     General: Bowel sounds are normal.  Palpations: Abdomen is soft.     Tenderness: There is no abdominal tenderness. There is no guarding or rebound.  Musculoskeletal:        General: Normal range of motion.     Cervical back: Normal range of motion and neck supple.  Lymphadenopathy:     Cervical: No cervical adenopathy.  Skin:    General: Skin is warm and dry.     Findings: No rash.  Neurological:     Mental Status: She is alert and oriented to person, place, and time.     (all labs ordered are listed, but only abnormal results are displayed) Labs Reviewed  CBC WITH DIFFERENTIAL/PLATELET - Abnormal; Notable for the following components:      Result Value   RBC 3.58 (*)    Hemoglobin 9.5 (*)    HCT 35.8 (*)    MCHC 26.5 (*)    Abs Immature Granulocytes 0.12 (*)    All other components within normal limits  CBG MONITORING, ED - Abnormal; Notable for the following components:   Glucose-Capillary >600 (*)    All other components within normal limits  I-STAT CHEM 8, ED - Abnormal; Notable for the following components:   Sodium 117 (*)    Chloride 88 (*)    BUN 35 (*)    Creatinine, Ser 5.30 (*)    Glucose, Bld >700 (*)    Calcium , Ion 1.14 (*)    TCO2 10 (*)    Hemoglobin 11.9 (*)    HCT 35.0 (*)    All other components within normal limits  I-STAT VENOUS BLOOD GAS, ED - Abnormal;  Notable for the following components:   pH, Ven 7.163 (*)    pCO2, Ven 23.8 (*)    pO2, Ven 95 (*)    Bicarbonate 8.5 (*)    TCO2 9 (*)    Acid-base deficit 18.0 (*)    Sodium 116 (*)    Calcium , Ion 1.12 (*)    HCT 35.0 (*)    Hemoglobin 11.9 (*)    All other components within normal limits  HCG, SERUM, QUALITATIVE  URINALYSIS, ROUTINE W REFLEX MICROSCOPIC  BASIC METABOLIC PANEL WITH GFR  BASIC METABOLIC PANEL WITH GFR  BASIC METABOLIC PANEL WITH GFR  BETA-HYDROXYBUTYRIC ACID  BETA-HYDROXYBUTYRIC ACID  BETA-HYDROXYBUTYRIC ACID  BETA-HYDROXYBUTYRIC ACID  BASIC METABOLIC PANEL WITH GFR    EKG: EKG Interpretation Date/Time:  Thursday April 30 2024 20:54:39 EST Ventricular Rate:  87 PR Interval:  145 QRS Duration:  89 QT Interval:  397 QTC Calculation: 478 R Axis:   89  Text Interpretation: Sinus rhythm Probable lateral infarct, age indeterminate since last tracing no significant change Confirmed by Lenor Hollering ([id number]) on 04/30/2024 9:16:39 PM  Radiology: [facility] Chest Port 1 View Result Date: 04/30/2024 EXAM: 1 VIEW(S) XRAY OF THE CHEST 04/30/2024 08:32:51 PM COMPARISON: 04/24/2024 CLINICAL HISTORY: SOB FINDINGS: LUNGS AND PLEURA: Low lung volumes. Perihilar vascular fullness with bilateral interstitial prominence. Decreased retrocardiac opacity. Trace right pleural effusion. No focal pulmonary opacity. No pneumothorax. HEART AND MEDIASTINUM: Unchanged cardiomegaly. BONES AND SOFT TISSUES: No acute osseous abnormality. IMPRESSION: 1. Mild pulmonary vascular congestion and interstitial edema. 2. Trace right pleural effusion. 3. Unchanged cardiomegaly. Electronically signed by: Greig Pique MD 04/30/2024 08:38 PM EST RP Workstation: [id number]     Procedures   Medications Ordered in the ED  sodium chloride  0.9 % bolus 500 mL (500 mLs Intravenous New Bag/Given 04/30/24 2125)  fentaNYL  (SUBLIMAZE ) injection 50 mcg (50 mcg Intravenous Given 04/30/24 2244)  Medical Decision Making Amount and/or Complexity of Data Reviewed Labs: ordered. Radiology: ordered.  Risk Prescription drug management. Decision regarding hospitalization.   This patient presents to the ED for concern of fatigue, this involves an extensive number of treatment options, and is a complaint that carries with it a high risk of complications and morbidity.  I considered the following differential and admission for this acute, potentially life threatening condition.  The differential diagnosis includes DKA, hyperglycemia, sepsis, pneumonia, respiratory failure, dehydration  MDM:    Patient is a 34 year old patient who has had some recent admissions including most recently for DKA and seizure-like activity.  She is alert and oriented but tachypneic and smells of ketones.  Her glucose was greater than 600.  pH shows acidosis.  Chest x-ray shows some mild edema.  She does not have any hypoxia or increased work of breathing.  She is mildly tachypneic which is likely from her markedly elevated glucose.  She was started on insulin  drip and a small bolus of IV fluids.  Her i-STAT shows a glucose of greater than 700 with a sodium of 117.  Correcting for the glucose would make a sodium of around 127.  Will get a more definitive glucose once her chemistry results.  However there has been a delay from the lab as apparently the first blood had a different label on it.  Discussed with Dr. Marcene who will admit the patient for further treatment.  CRITICAL CARE Performed by: Andrea Ness Total critical care time: 70 minutes Critical care time was exclusive of separately billable procedures and treating other patients. Critical care was necessary to treat or prevent imminent or life-threatening deterioration. Critical care was time spent personally by me on the following activities: development of treatment plan with patient and/or surrogate as well as nursing, discussions with  consultants, evaluation of patient's response to treatment, examination of patient, obtaining history from patient or surrogate, ordering and performing treatments and interventions, ordering and review of laboratory studies, ordering and review of radiographic studies, pulse oximetry and re-evaluation of patient's condition.   (Labs, imaging, consults)  Labs: I Ordered, and personally interpreted labs.  The pertinent results include: Elevated glucose, pH 7.16, hyponatremia but not significantly low after corrected for glucose.  Imaging Studies ordered: I ordered imaging studies including x-ray I independently visualized and interpreted imaging. I agree with the radiologist interpretation  Additional history obtained from chart.  External records from outside source obtained and reviewed including prior notes  Cardiac Monitoring: The patient was maintained on a cardiac monitor.  If on the cardiac monitor, I personally viewed and interpreted the cardiac monitored which showed an underlying rhythm of: Sinus rhythm  Reevaluation: After the interventions noted above, I reevaluated the patient and found that they have :improved  Social Determinants of Health:    Disposition: Admit to hospital  Co morbidities that complicate the patient evaluation  Past Medical History:  Diagnosis Date   Abscess, gluteal, right 08/24/2013   Anemia 02/19/2012   Bartholin's gland abscess 09/19/2013   Bipolar disorder (HCC)    BV (bacterial vaginosis) 11/24/2015   Depression    Diabetes mellitus type I (HCC) 2001   Diagnosed at age 38 ; Type I   Diarrhea 05/30/2016   DKA (diabetic ketoacidoses) 08/19/2013   Also in 2018   ESRD (end stage renal disease) (HCC)    Gonorrhea 08/2011   Treated in 09/2011   HFrEF (heart failure with reduced ejection fraction) (HCC)    a. 2022 Echo:  EF 40%; b. 10/2021 Echo: EF 55%; b. 07/2022 MV: No ischemia. EF 31%; c. 08/2022 Echo: EF 35%, mildly dil RV, sev TR.   History  of trichomoniasis 05/31/2016   Hyperlipidemia 03/28/2016   Hypertension    NICM (nonischemic cardiomyopathy) (HCC)    Sepsis (HCC) 09/19/2013     Medicines Meds ordered this encounter  Medications   sodium chloride  0.9 % bolus 500 mL   fentaNYL  (SUBLIMAZE ) injection 50 mcg    I have reviewed the patients home medicines and have made adjustments as needed  Problem List / ED Course: Problem List Items Addressed This Visit   None            Final diagnoses:  None    ED Discharge Orders     None          Lenor Hollering, MD 04/30/24 2347  "

## 2024-04-30 NOTE — ED Notes (Signed)
 Pt requesting pain medication. MD made aware, no new orders at this time.

## 2024-04-30 NOTE — H&P (Signed)
 " History and Physical      Ashley Freeman FMW:[id number] DOB: [birth year] DOA: 04/30/2024; DOS: 04/30/2024  PCP: Keven Crumbly Pap, MD *** Patient coming from: home ***  I have personally briefly reviewed patient's old medical records in [facility] Link  Chief Complaint: ***  HPI: Ashley Freeman is a 34 y.o. female with medical history significant for *** who is admitted to [facility] on 04/30/2024 with *** after presenting from home*** to [facility] ED complaining of ***.    ***       ***   ED Course:  Vital signs in the ED were notable for the following: ***  Labs were notable for the following: ***  Per my interpretation, EKG in ED demonstrated the following:  ***  Imaging in the ED, per corresponding formal radiology read, was notable for the following:  ***  While in the ED, the following were administered: ***  Subsequently, the patient was admitted  ***  ***red    Review of Systems: As per HPI otherwise 10 point review of systems negative.   Past Medical History:  Diagnosis Date   Abscess, gluteal, right 08/24/2013   Anemia 02/19/2012   Bartholin's gland abscess 09/19/2013   Bipolar disorder (HCC)    BV (bacterial vaginosis) 11/24/2015   Depression    Diabetes mellitus type I (HCC) 2001   Diagnosed at age 47 ; Type I   Diarrhea 05/30/2016   DKA (diabetic ketoacidoses) 08/19/2013   Also in 2018   ESRD (end stage renal disease) (HCC)    Gonorrhea 08/2011   Treated in 09/2011   HFrEF (heart failure with reduced ejection fraction) (HCC)    a. 2022 Echo: EF 40%; b. 10/2021 Echo: EF 55%; b. 07/2022 MV: No ischemia. EF 31%; c. 08/2022 Echo: EF 35%, mildly dil RV, sev TR.   History of trichomoniasis 05/31/2016   Hyperlipidemia 03/28/2016   Hypertension    NICM (nonischemic cardiomyopathy) (HCC)    Sepsis (HCC) 09/19/2013    Past Surgical History:  Procedure Laterality Date   A/V FISTULAGRAM Right 06/17/2023   Procedure: A/V  Fistulagram;  Surgeon: Marea Selinda GORMAN, MD;  Location: [facility] INVASIVE CV LAB;  Service: Cardiovascular;  Laterality: Right;   A/V SHUNT INTERVENTION N/A 09/25/2023   Procedure: A/V SHUNT INTERVENTION;  Surgeon: Pearline Norman GORMAN, MD;  Location: [facility] PV LAB;  Service: Cardiovascular;  Laterality: N/A;   AV FISTULA PLACEMENT Right 07/06/2022   Procedure: ARTERIOVENOUS GRAFT CREATION;  Surgeon: Gretta Lonni PARAS, MD;  Location: [facility] OR;  Service: Vascular;  Laterality: Right;   CESAREAN SECTION N/A 10/05/2019   Procedure: CESAREAN SECTION;  Surgeon: Izell Harari, MD;  Location: [facility] LD ORS;  Service: Obstetrics;  Laterality: N/A;   CHOLECYSTECTOMY N/A 07/02/2023   Procedure: LAPAROSCOPIC CHOLECYSTECTOMY;  Surgeon: Ebbie Cough, MD;  Location: [facility] OR;  Service: General;  Laterality: N/A;   ESOPHAGOGASTRODUODENOSCOPY N/A 01/31/2024   Procedure: EGD (ESOPHAGOGASTRODUODENOSCOPY);  Surgeon: San Sandor GAILS, DO;  Location: [facility] ENDOSCOPY;  Service: Gastroenterology;  Laterality: N/A;   INCISION AND DRAINAGE ABSCESS Left 09/28/2019   Procedure: INCISION AND DRAINAGE VULVAR ABCESS;  Surgeon: Edsel Norleen GAILS, MD;  Location: [facility] OR;  Service: Gynecology;  Laterality: Left;   INCISION AND DRAINAGE ABSCESS Right 02/23/2024   Procedure: INCISION AND DRAINAGE, ABSCESS;  Surgeon: Tobie Eldora NOVAK, MD;  Location: [facility] OR;  Service: ENT;  Laterality: Right;   INCISION AND DRAINAGE PERIRECTAL ABSCESS Right 08/18/2013   Procedure: IRRIGATION AND DEBRIDEMENT GLUTEAL ABSCESS;  Surgeon: Lynda Leos, MD;  Location: [facility] OR;  Service: General;  Laterality: Right;   INCISION AND DRAINAGE PERIRECTAL ABSCESS Right 09/19/2013   Procedure: IRRIGATION AND DEBRIDEMENT RIGHT GLUTEAL AND LABIAL ABSCESSES;  Surgeon: Lynda Leos, MD;  Location: [facility] OR;  Service: General;  Laterality: Right;   INCISION AND DRAINAGE PERIRECTAL ABSCESS Right 09/24/2013   Procedure: IRRIGATION AND DEBRIDEMENT PERIRECTAL ABSCESS;  Surgeon: Lynwood MALVA Pina, MD;  Location: [facility] OR;  Service: General;  Laterality: Right;   IR PARACENTESIS  08/28/2023   IR PARACENTESIS  11/04/2023   IR PARACENTESIS  12/23/2023   IR PARACENTESIS  02/04/2024   IR PARACENTESIS  03/23/2024   IR PARACENTESIS  04/06/2024    Social History:  reports that she has never smoked. She has never been exposed to tobacco smoke. She has never used smokeless tobacco. She reports that she does not currently use alcohol . She reports that she does not use drugs.   Allergies[1]  Family History  Problem Relation Age of Onset   Asthma Mother    Carpal tunnel syndrome Mother    Gout Father    Diabetes Paternal Grandmother    Anesthesia problems Neg Hx     Family history reviewed and not pertinent ***   Prior to Admission medications  Medication Sig Start Date End Date Taking? Authorizing Provider  albuterol  (VENTOLIN  HFA) 108 (90 Base) MCG/ACT inhaler Inhale 2 puffs into the lungs every 4 (four) hours as needed for wheezing or shortness of breath. 11/24/22   [provider]  bumetanide  (BUMEX ) 2 MG tablet Take 10 mg by mouth daily.    [provider]  carvedilol  (COREG ) 25 MG tablet Take 1 tablet (25 mg total) by mouth 2 (two) times daily with a meal. Follow with your PCP for refills. 04/18/24 05/18/24  Perri DELENA Meliton Mickey., MD  Cinnamon 500 MG capsule Take 500 mg by mouth in the morning.    [provider]  diazePAM , 15 MG Dose, 2 x 7.5 MG/0.1ML LQPK Place 15 mg into the nose as needed (sz lasting over 2 minutes). 04/28/24   Yadav, Priyanka O, MD  DULoxetine  (CYMBALTA ) 20 MG capsule Take 20 mg by mouth daily. 07/29/23   [provider]  Elderberry-Vitamin C-Zinc  (ELDERBERRY IMMUNE HEALTH GUMMY PO) Take 2 each by mouth in the morning.    [provider]  fluticasone  (FLONASE ) 50 MCG/ACT nasal spray Place 2 sprays into both nostrils daily as needed for allergies or rhinitis. 12/11/23 12/10/24  [provider]  hydrOXYzine   (ATARAX ) 25 MG tablet Take 25 mg by mouth 3 (three) times daily as needed for anxiety, itching, nausea or vomiting.    [provider]  lamoTRIgine  (LAMICTAL ) 25 MG tablet Take 25 mg (1 tablet) daily for 10 days.   Then take 50 mg (2 tablets) daily for 14 days.   Then take 100 mg (4 tablets) daily for 7 days.   Then take 100 mg (4 tablets) in the morning and 25 mg (1 tablet) at night for 7 days.   Then take 100 mg (4 tablets) in the morning and 50 mg (2 tablets) at night for 7 days.   Then take 100 mg (4 tablets) twice a day.  Continue this dose.  Follow with your PCP for refills. 04/18/24   Perri DELENA Meliton Mickey., MD  LANTUS  SOLOSTAR 100 UNIT/ML Solostar Pen Inject 5 Units into the skin daily. 04/18/24   Perri DELENA Meliton Mickey., MD  levETIRAcetam  (KEPPRA ) 250 MG tablet  Take 2 tablets (500 mg total) by mouth every morning AND 1 tablet (250 mg total) every Tuesday, Thursday, and Saturday at 6 PM. (After dialysis). 04/18/24 05/30/24  Perri DELENA Meliton Mickey., MD  NOVOLOG  FLEXPEN 100 UNIT/ML FlexPen Before each meal 3 times a day, 140-199 - 2 units, 200-250 - 4 units, 251-299 - 6 units,  300-349 - 7 units,  350 or above 8 units. 03/30/24   Dennise Lavada POUR, MD  OLANZapine  (ZYPREXA ) 5 MG tablet Take 5 mg by mouth at bedtime.    [provider]  Pancrelipase , Lip-Prot-Amyl, 3000-9500 units CPEP Take 3,000 Units by mouth in the morning, at noon, and at bedtime.    [provider]  pantoprazole  (PROTONIX ) 40 MG tablet Take 1 tablet (40 mg total) by mouth 2 (two) times daily. 02/07/24 04/10/24  Drusilla Sabas RAMAN, MD  prochlorperazine  (COMPAZINE ) 5 MG tablet Take 5 mg by mouth every 6 (six) hours as needed for nausea or vomiting.    [provider]  [Paused] rosuvastatin  (CRESTOR ) 40 MG tablet Take 40 mg by mouth daily. Wait to take this until your doctor or other care provider tells you to start again. 01/20/24 01/19/25  [provider]  sacubitril -valsartan  (ENTRESTO )  24-26 MG Take 1 tablet by mouth 2 (two) times daily. Follow with your PCP/outpatient provider for refills 04/18/24 05/18/24  Perri DELENA Meliton Mickey., MD  sevelamer  carbonate (RENVELA ) 800 MG tablet Take 1 tablet (800 mg total) by mouth 3 (three) times daily with meals. 02/07/24   Drusilla Sabas RAMAN, MD  sodium bicarbonate  650 MG tablet Take 650 mg by mouth 2 (two) times daily. Patient not taking: Reported on 04/07/2024 07/29/23   [provider]  SUMAtriptan  (IMITREX ) 50 MG tablet Take 50 mg by mouth every 2 (two) hours as needed for migraine or headache. 06/20/22   [provider]  VELTASSA  16.8 g PACK Take 1 Dose by mouth daily at 12 noon. 04/03/24   [provider]     Objective    Physical Exam: Vitals:   04/30/24 1949 04/30/24 2045 04/30/24 2130 04/30/24 2330  BP:  (!) 155/82 (!) 160/82 (!) 158/63  Pulse:  88 86 85  Resp: 17 20 19 19   Temp: 97.7 F (36.5 C)     TempSrc: Oral     SpO2:  95% 100% 99%  Weight: 59.4 kg     Height: 5' 3 (1.6 m)       General: appears to be stated age; alert, oriented Skin: warm, dry, no rash Head:  AT/[place] Mouth:  Oral mucosa membranes appear moist, normal dentition Neck: supple; trachea midline Heart:  RRR; did not appreciate any M/R/G Lungs: CTAB, did not appreciate any wheezes, rales, or rhonchi Abdomen: + BS; soft, ND, NT Vascular: 2+ pedal pulses b/l; 2+ radial pulses b/l Extremities: no peripheral edema, no muscle wasting   ***   *** Neuro: strength and sensation intact in upper and lower extremities b/l  *** Neuro: 5/5 strength of the proximal and distal flexors and extensors of the upper and lower extremities bilaterally; sensation intact in upper and lower extremities b/l; cranial nerves II through XII grossly intact; no pronator drift; no evidence suggestive of slurred speech, dysarthria, or facial droop; Normal muscle tone. No tremors. *** Neuro: In the setting of the patient's current mental status and associated  inability to follow instructions, unable to perform full neurologic exam at this time.  As such, assessment of strength, sensation, and cranial nerves is limited  at this time. Patient noted to spontaneously move all 4 extremities. No tremors.  ***        Labs on Admission: I have personally reviewed following labs and imaging studies  CBC: Recent Labs  Lab 04/24/24 0750 04/25/24 0720 04/30/24 2028 04/30/24 2134 04/30/24 2135  WBC 19.5* 16.4* 7.5  --   --   NEUTROABS  --   --  3.8  --   --   HGB 12.6 11.1* 9.5* 11.9* 11.9*  HCT 36.7 33.1* 35.8* 35.0* 35.0*  MCV 78.6* 78.4* 100.0  --   --   PLT 222 197 284  --   --    Basic Metabolic Panel: Recent Labs  Lab 04/25/24 0728 04/26/24 0429 04/26/24 1744 04/27/24 0444 04/28/24 0550 04/30/24 2134 04/30/24 2135  NA 126* 132* 133* 134* 133* 117* 116*  K 4.0 3.7 3.6 3.5 3.9 4.8 4.7  CL 88* 94* 93* 95* 95* 88*  --   CO2 19* 26 26 24  19*  --   --   GLUCOSE 223* 129* 85 129* 173* >700*  --   BUN 45* 28* 30* 31* 35* 35*  --   CREATININE 4.68* 3.50* 3.92* 4.27* 5.19* 5.30*  --   CALCIUM  8.6* 7.9* 8.5* 8.6* 8.9  --   --   MG 1.8 2.0  --  2.1 2.3  --   --   PHOS 4.5  4.4 4.0  --  5.2* 6.1*  --   --    GFR: Estimated Creatinine Clearance: 12.7 mL/min (A) (by C-G formula based on SCr of 5.3 mg/dL (H)). Liver Function Tests: Recent Labs  Lab 04/25/24 0728  ALBUMIN  2.9*   No results for input(s): LIPASE, AMYLASE in the last 168 hours. No results for input(s): AMMONIA in the last 168 hours. Coagulation Profile: No results for input(s): INR, PROTIME in the last 168 hours. Cardiac Enzymes: No results for input(s): CKTOTAL, CKMB, CKMBINDEX, TROPONINI in the last 168 hours. BNP (last 3 results) No results for input(s): PROBNP in the last 8760 hours. HbA1C: No results for input(s): HGBA1C in the last 72 hours. CBG: Recent Labs  Lab 04/27/24 2329 04/28/24 0207 04/28/24 1411 04/28/24 1605 04/30/24 1957   GLUCAP 177* 184* 188* 201* >600*   Lipid Profile: No results for input(s): CHOL, HDL, LDLCALC, TRIG, CHOLHDL, LDLDIRECT in the last 72 hours. Thyroid  Function Tests: No results for input(s): TSH, T4TOTAL, FREET4, T3FREE, THYROIDAB in the last 72 hours. Anemia Panel: No results for input(s): VITAMINB12, FOLATE, FERRITIN, TIBC, IRON , RETICCTPCT in the last 72 hours. Urine analysis:    Component Value Date/Time   COLORURINE YELLOW 04/13/2024 1749   APPEARANCEUR HAZY (A) 04/13/2024 1749   APPEARANCEUR Hazy 11/10/2013 2043   LABSPEC 1.018 04/13/2024 1749   LABSPEC 1.031 11/10/2013 2043   PHURINE 7.0 04/13/2024 1749   GLUCOSEU >=500 (A) 04/13/2024 1749   GLUCOSEU >=500 11/10/2013 2043   HGBUR NEGATIVE 04/13/2024 1749   BILIRUBINUR NEGATIVE 04/13/2024 1749   BILIRUBINUR negative 06/04/2018 1035   BILIRUBINUR Negative 11/24/2015 1443   BILIRUBINUR Negative 11/10/2013 2043   KETONESUR 5 (A) 04/13/2024 1749   PROTEINUR >=300 (A) 04/13/2024 1749   UROBILINOGEN 0.2 09/14/2019 1732   NITRITE NEGATIVE 04/13/2024 1749   LEUKOCYTESUR NEGATIVE 04/13/2024 1749   LEUKOCYTESUR 1+ 11/10/2013 2043    Radiological Exams on Admission: DG Chest Port 1 View Result Date: 04/30/2024 EXAM: 1 VIEW(S) XRAY OF THE CHEST 04/30/2024 08:32:51 PM COMPARISON: 04/24/2024 CLINICAL HISTORY: SOB FINDINGS: LUNGS AND PLEURA: Low lung  volumes. Perihilar vascular fullness with bilateral interstitial prominence. Decreased retrocardiac opacity. Trace right pleural effusion. No focal pulmonary opacity. No pneumothorax. HEART AND MEDIASTINUM: Unchanged cardiomegaly. BONES AND SOFT TISSUES: No acute osseous abnormality. IMPRESSION: 1. Mild pulmonary vascular congestion and interstitial edema. 2. Trace right pleural effusion. 3. Unchanged cardiomegaly. Electronically signed by: Greig Pique MD 04/30/2024 08:38 PM EST RP Workstation: [id number]      Assessment/Plan   Active Problems:   DKA  (diabetic ketoacidosis) (HCC)   ***            ***                     ***                      ***                     ***                     ***                     ***                      ***                     ***                     ***                     ***                     ***                    ***                   ***  DVT prophylaxis: SCD's ***  Code Status: Full code*** Family Communication: none*** Disposition Plan: Per Rounding Team Consults called: none***;  Admission status: ***     I SPENT GREATER THAN 75 *** MINUTES IN CLINICAL CARE TIME/MEDICAL DECISION-MAKING IN COMPLETING THIS ADMISSION.      Eva NOVAK Ryan Palermo DO [facility]  From 7PM - 7AM   04/30/2024, 11:48 PM   ***     [1]  Allergies Allergen Reactions   Keflex  [Cephalexin ] Anaphylaxis    Ceftriaxone  in the past with no reaction   Penicillins Anaphylaxis, Hives and Rash   Vibramycin  [Doxycycline ] Anaphylaxis   Benadryl  [Diphenhydramine ] Itching   Dilaudid  [Hydromorphone ] Itching   Methotrexate And Trimetrexate Rash   Roxicodone  [Oxycodone ] Itching    Takes Percocet without issue   "

## 2024-05-01 ENCOUNTER — Other Ambulatory Visit (HOSPITAL_COMMUNITY): Payer: Self-pay

## 2024-05-01 ENCOUNTER — Encounter (HOSPITAL_COMMUNITY): Payer: Self-pay | Admitting: Internal Medicine

## 2024-05-01 DIAGNOSIS — E785 Hyperlipidemia, unspecified: Secondary | ICD-10-CM | POA: Diagnosis present

## 2024-05-01 DIAGNOSIS — N2581 Secondary hyperparathyroidism of renal origin: Secondary | ICD-10-CM | POA: Diagnosis present

## 2024-05-01 DIAGNOSIS — Z885 Allergy status to narcotic agent status: Secondary | ICD-10-CM | POA: Diagnosis not present

## 2024-05-01 DIAGNOSIS — E1110 Type 2 diabetes mellitus with ketoacidosis without coma: Secondary | ICD-10-CM | POA: Diagnosis present

## 2024-05-01 DIAGNOSIS — F319 Bipolar disorder, unspecified: Secondary | ICD-10-CM | POA: Diagnosis present

## 2024-05-01 DIAGNOSIS — Z881 Allergy status to other antibiotic agents status: Secondary | ICD-10-CM | POA: Diagnosis not present

## 2024-05-01 DIAGNOSIS — R531 Weakness: Secondary | ICD-10-CM | POA: Diagnosis not present

## 2024-05-01 DIAGNOSIS — Z833 Family history of diabetes mellitus: Secondary | ICD-10-CM | POA: Diagnosis not present

## 2024-05-01 DIAGNOSIS — E875 Hyperkalemia: Secondary | ICD-10-CM | POA: Diagnosis present

## 2024-05-01 DIAGNOSIS — I132 Hypertensive heart and chronic kidney disease with heart failure and with stage 5 chronic kidney disease, or end stage renal disease: Secondary | ICD-10-CM | POA: Diagnosis present

## 2024-05-01 DIAGNOSIS — Z880 Allergy status to penicillin: Secondary | ICD-10-CM | POA: Diagnosis not present

## 2024-05-01 DIAGNOSIS — I871 Compression of vein: Secondary | ICD-10-CM | POA: Diagnosis present

## 2024-05-01 DIAGNOSIS — E10649 Type 1 diabetes mellitus with hypoglycemia without coma: Secondary | ICD-10-CM | POA: Diagnosis present

## 2024-05-01 DIAGNOSIS — D631 Anemia in chronic kidney disease: Secondary | ICD-10-CM | POA: Diagnosis present

## 2024-05-01 DIAGNOSIS — I5042 Chronic combined systolic (congestive) and diastolic (congestive) heart failure: Secondary | ICD-10-CM | POA: Diagnosis present

## 2024-05-01 DIAGNOSIS — G40909 Epilepsy, unspecified, not intractable, without status epilepticus: Secondary | ICD-10-CM | POA: Diagnosis present

## 2024-05-01 DIAGNOSIS — Z888 Allergy status to other drugs, medicaments and biological substances status: Secondary | ICD-10-CM | POA: Diagnosis not present

## 2024-05-01 DIAGNOSIS — E1022 Type 1 diabetes mellitus with diabetic chronic kidney disease: Secondary | ICD-10-CM | POA: Diagnosis present

## 2024-05-01 DIAGNOSIS — G928 Other toxic encephalopathy: Secondary | ICD-10-CM | POA: Diagnosis present

## 2024-05-01 DIAGNOSIS — I428 Other cardiomyopathies: Secondary | ICD-10-CM | POA: Diagnosis present

## 2024-05-01 DIAGNOSIS — E1010 Type 1 diabetes mellitus with ketoacidosis without coma: Secondary | ICD-10-CM | POA: Diagnosis present

## 2024-05-01 DIAGNOSIS — Z794 Long term (current) use of insulin: Secondary | ICD-10-CM | POA: Diagnosis not present

## 2024-05-01 DIAGNOSIS — N186 End stage renal disease: Secondary | ICD-10-CM | POA: Diagnosis present

## 2024-05-01 DIAGNOSIS — Z91148 Patient's other noncompliance with medication regimen for other reason: Secondary | ICD-10-CM | POA: Diagnosis not present

## 2024-05-01 DIAGNOSIS — Z992 Dependence on renal dialysis: Secondary | ICD-10-CM | POA: Diagnosis not present

## 2024-05-01 DIAGNOSIS — Z79899 Other long term (current) drug therapy: Secondary | ICD-10-CM | POA: Diagnosis not present

## 2024-05-01 LAB — CBC WITH DIFFERENTIAL/PLATELET
Abs Immature Granulocytes: 0.09 K/uL — ABNORMAL HIGH (ref 0.00–0.07)
Basophils Absolute: 0.1 K/uL (ref 0.0–0.1)
Basophils Relative: 1 %
Eosinophils Absolute: 0.3 K/uL (ref 0.0–0.5)
Eosinophils Relative: 4 %
HCT: 31.7 % — ABNORMAL LOW (ref 36.0–46.0)
Hemoglobin: 9.2 g/dL — ABNORMAL LOW (ref 12.0–15.0)
Immature Granulocytes: 1 %
Lymphocytes Relative: 37 %
Lymphs Abs: 2.8 K/uL (ref 0.7–4.0)
MCH: 26.6 pg (ref 26.0–34.0)
MCHC: 29 g/dL — ABNORMAL LOW (ref 30.0–36.0)
MCV: 91.6 fL (ref 80.0–100.0)
Monocytes Absolute: 0.8 K/uL (ref 0.1–1.0)
Monocytes Relative: 10 %
Neutro Abs: 3.7 K/uL (ref 1.7–7.7)
Neutrophils Relative %: 47 %
Platelets: 282 K/uL (ref 150–400)
RBC: 3.46 MIL/uL — ABNORMAL LOW (ref 3.87–5.11)
RDW: 14.9 % (ref 11.5–15.5)
WBC: 7.7 K/uL (ref 4.0–10.5)
nRBC: 0 % (ref 0.0–0.2)

## 2024-05-01 LAB — GLUCOSE, CAPILLARY
Glucose-Capillary: 113 mg/dL — ABNORMAL HIGH (ref 70–99)
Glucose-Capillary: 122 mg/dL — ABNORMAL HIGH (ref 70–99)
Glucose-Capillary: 138 mg/dL — ABNORMAL HIGH (ref 70–99)
Glucose-Capillary: 152 mg/dL — ABNORMAL HIGH (ref 70–99)
Glucose-Capillary: 249 mg/dL — ABNORMAL HIGH (ref 70–99)
Glucose-Capillary: 299 mg/dL — ABNORMAL HIGH (ref 70–99)

## 2024-05-01 LAB — MAGNESIUM
Magnesium: 2.1 mg/dL (ref 1.7–2.4)
Magnesium: 1.8 mg/dL (ref 1.7–2.4)

## 2024-05-01 LAB — BASIC METABOLIC PANEL WITH GFR
Anion gap: 26 — ABNORMAL HIGH (ref 5–15)
Anion gap: 24 — ABNORMAL HIGH (ref 5–15)
Anion gap: 24 — ABNORMAL HIGH (ref 5–15)
Anion gap: 17 — ABNORMAL HIGH (ref 5–15)
BUN: 40 mg/dL — ABNORMAL HIGH (ref 6–20)
BUN: 42 mg/dL — ABNORMAL HIGH (ref 6–20)
BUN: 42 mg/dL — ABNORMAL HIGH (ref 6–20)
BUN: 39 mg/dL — ABNORMAL HIGH (ref 6–20)
CO2: 11 mmol/L — ABNORMAL LOW (ref 22–32)
CO2: 13 mmol/L — ABNORMAL LOW (ref 22–32)
CO2: 15 mmol/L — ABNORMAL LOW (ref 22–32)
CO2: 18 mmol/L — ABNORMAL LOW (ref 22–32)
Calcium: 8.6 mg/dL — ABNORMAL LOW (ref 8.9–10.3)
Calcium: 8.7 mg/dL — ABNORMAL LOW (ref 8.9–10.3)
Calcium: 8.9 mg/dL (ref 8.9–10.3)
Calcium: 8.3 mg/dL — ABNORMAL LOW (ref 8.9–10.3)
Chloride: 83 mmol/L — ABNORMAL LOW (ref 98–111)
Chloride: 84 mmol/L — ABNORMAL LOW (ref 98–111)
Chloride: 87 mmol/L — ABNORMAL LOW (ref 98–111)
Chloride: 93 mmol/L — ABNORMAL LOW (ref 98–111)
Creatinine, Ser: 5.19 mg/dL — ABNORMAL HIGH (ref 0.44–1.00)
Creatinine, Ser: 5.37 mg/dL — ABNORMAL HIGH (ref 0.44–1.00)
Creatinine, Ser: 5.59 mg/dL — ABNORMAL HIGH (ref 0.44–1.00)
Creatinine, Ser: 5.01 mg/dL — ABNORMAL HIGH (ref 0.44–1.00)
GFR, Estimated: 11 mL/min — ABNORMAL LOW
GFR, Estimated: 10 mL/min — ABNORMAL LOW
GFR, Estimated: 10 mL/min — ABNORMAL LOW
GFR, Estimated: 11 mL/min — ABNORMAL LOW
Glucose, Bld: >1200 mg/dL (ref 70–99)
Glucose, Bld: 1131 mg/dL (ref 70–99)
Glucose, Bld: 750 mg/dL (ref 70–99)
Glucose, Bld: 183 mg/dL — ABNORMAL HIGH (ref 70–99)
Potassium: 4 mmol/L (ref 3.5–5.1)
Potassium: 3.6 mmol/L (ref 3.5–5.1)
Potassium: 3.7 mmol/L (ref 3.5–5.1)
Potassium: 3.1 mmol/L — ABNORMAL LOW (ref 3.5–5.1)
Sodium: 120 mmol/L — ABNORMAL LOW (ref 135–145)
Sodium: 121 mmol/L — ABNORMAL LOW (ref 135–145)
Sodium: 125 mmol/L — ABNORMAL LOW (ref 135–145)
Sodium: 129 mmol/L — ABNORMAL LOW (ref 135–145)

## 2024-05-01 LAB — CBG MONITORING, ED
Glucose-Capillary: 398 mg/dL — ABNORMAL HIGH (ref 70–99)
Glucose-Capillary: 440 mg/dL — ABNORMAL HIGH (ref 70–99)
Glucose-Capillary: 504 mg/dL (ref 70–99)
Glucose-Capillary: 571 mg/dL (ref 70–99)
Glucose-Capillary: >600 mg/dL (ref 70–99)
Glucose-Capillary: >600 mg/dL (ref 70–99)
Glucose-Capillary: >600 mg/dL (ref 70–99)
Glucose-Capillary: >600 mg/dL (ref 70–99)
Glucose-Capillary: >600 mg/dL (ref 70–99)
Glucose-Capillary: >600 mg/dL (ref 70–99)
Glucose-Capillary: >600 mg/dL (ref 70–99)
Glucose-Capillary: >600 mg/dL (ref 70–99)
Glucose-Capillary: >600 mg/dL (ref 70–99)
Glucose-Capillary: >600 mg/dL (ref 70–99)
Glucose-Capillary: >600 mg/dL (ref 70–99)
Glucose-Capillary: >600 mg/dL (ref 70–99)
Glucose-Capillary: >600 mg/dL (ref 70–99)
Glucose-Capillary: >600 mg/dL (ref 70–99)
Glucose-Capillary: >600 mg/dL (ref 70–99)

## 2024-05-01 LAB — PRO BRAIN NATRIURETIC PEPTIDE: Pro Brain Natriuretic Peptide: >35000 pg/mL — ABNORMAL HIGH

## 2024-05-01 LAB — PHOSPHORUS: Phosphorus: 6.2 mg/dL — ABNORMAL HIGH (ref 2.5–4.6)

## 2024-05-01 LAB — BETA-HYDROXYBUTYRIC ACID
Beta-Hydroxybutyric Acid: 0.12 mmol/L (ref 0.05–0.27)
Beta-Hydroxybutyric Acid: 3.35 mmol/L — ABNORMAL HIGH (ref 0.05–0.27)
Beta-Hydroxybutyric Acid: >8 mmol/L — ABNORMAL HIGH (ref 0.05–0.27)

## 2024-05-01 MED ORDER — ALTEPLASE 2 MG IJ SOLR: 2.0000 mg | Freq: Once | INTRAMUSCULAR | Status: DC | PRN

## 2024-05-01 MED ORDER — POTASSIUM CHLORIDE 10 MEQ/100ML IV SOLN
10.0000 meq | INTRAVENOUS | Status: DC
  Administered 2024-05-01 (×2): 10 meq via INTRAVENOUS
  Filled 2024-05-01 (×2): qty 100

## 2024-05-01 MED ORDER — LIDOCAINE HCL (PF) 1 % IJ SOLN: 5.0000 mL | INTRAMUSCULAR | Status: DC | PRN

## 2024-05-01 MED ORDER — POTASSIUM CHLORIDE CRYS ER 20 MEQ PO TBCR
20.0000 meq | EXTENDED_RELEASE_TABLET | Freq: Once | ORAL | Status: DC
  Filled 2024-05-01: qty 1

## 2024-05-01 MED ORDER — HEPARIN SODIUM (PORCINE) 1000 UNIT/ML DIALYSIS: 1000.0000 [IU] | INTRAMUSCULAR | Status: DC | PRN

## 2024-05-01 MED ORDER — FAMOTIDINE 20 MG PO TABS
20.0000 mg | ORAL_TABLET | Freq: Every day | ORAL | Status: DC
  Administered 2024-05-01: 20 mg via ORAL
  Filled 2024-05-01 (×2): qty 1

## 2024-05-01 MED ORDER — ZIPRASIDONE MESYLATE 20 MG IM SOLR
10.0000 mg | Freq: Once | INTRAMUSCULAR | Status: AC
  Administered 2024-05-01: 10 mg via INTRAMUSCULAR
  Filled 2024-05-01: qty 20

## 2024-05-01 MED ORDER — LORAZEPAM 2 MG/ML IJ SOLN
1.0000 mg | Freq: Four times a day (QID) | INTRAMUSCULAR | Status: DC | PRN
  Administered 2024-05-01 – 2024-05-02 (×2): 1 mg via INTRAVENOUS
  Filled 2024-05-01 (×2): qty 1

## 2024-05-01 MED ORDER — LIDOCAINE-PRILOCAINE 2.5-2.5 % EX CREA: 1.0000 | TOPICAL_CREAM | CUTANEOUS | Status: DC | PRN

## 2024-05-01 MED ORDER — CHLORHEXIDINE GLUCONATE CLOTH 2 % EX PADS
6.0000 | MEDICATED_PAD | Freq: Every day | CUTANEOUS | Status: DC
  Administered 2024-05-02 – 2024-05-04 (×3): 6 via TOPICAL

## 2024-05-01 MED ORDER — ANTICOAGULANT SODIUM CITRATE 4% (200MG/5ML) IV SOLN: 5.0000 mL | Status: DC | PRN

## 2024-05-01 MED ORDER — STERILE WATER FOR INJECTION IJ SOLN
INTRAMUSCULAR | Status: AC
  Administered 2024-05-01: 10 mL
  Filled 2024-05-01: qty 10

## 2024-05-01 MED ORDER — QUETIAPINE FUMARATE 25 MG PO TABS
25.0000 mg | ORAL_TABLET | Freq: Every day | ORAL | Status: DC
  Filled 2024-05-01: qty 1

## 2024-05-01 MED ORDER — CHLORHEXIDINE GLUCONATE CLOTH 2 % EX PADS
6.0000 | MEDICATED_PAD | Freq: Every day | CUTANEOUS | Status: DC
  Administered 2024-05-02 – 2024-05-04 (×2): 6 via TOPICAL

## 2024-05-01 MED ORDER — HEPARIN SODIUM (PORCINE) 5000 UNIT/ML IJ SOLN
5000.0000 [IU] | Freq: Two times a day (BID) | INTRAMUSCULAR | Status: DC
  Administered 2024-05-02 – 2024-05-03 (×5): 5000 [IU] via SUBCUTANEOUS
  Filled 2024-05-01 (×6): qty 1

## 2024-05-01 MED ORDER — EPINEPHRINE 0.3 MG/0.3ML IJ SOAJ
0.3000 mg | Freq: Once | INTRAMUSCULAR | Status: AC
  Administered 2024-05-01: 0.3 mg via INTRAMUSCULAR
  Filled 2024-05-01: qty 0.3

## 2024-05-01 MED ORDER — PENTAFLUOROPROP-TETRAFLUOROETH EX AERO: 1.0000 | INHALATION_SPRAY | CUTANEOUS | Status: DC | PRN

## 2024-05-01 NOTE — ED Notes (Signed)
 CANDIE Chapel paged to [phone] per Charge RN request.

## 2024-05-01 NOTE — ED Notes (Signed)
 MD aware BG remains high. Pt continues to remove monitoring devices and Ivs.

## 2024-05-01 NOTE — ED Notes (Signed)
 Ogbata MD aware of pt BG values remaining >600 on gulcometer and morning labs showing >1200. As per MD maintain endotool protocol. New BG >600 at this time, endotool remains at 6.5u/hour.

## 2024-05-01 NOTE — ED Notes (Signed)
 Discussed care plan with Ogbata MD at bedside and was informed patient needs to remain on low dose insulin  due to kidney function. MD stated the patient needs dialysis first in order to work on resolving BG issue. MD advised me to call dialysis and request where the patient is at in line.   Dialysis informed me the patient must be in a room before she gets dialysis because it has to be done at the bedside due to her being on a titratable insulin  drip. I explained to the dialysis nurse that the patient is very sick and needs to be seen as soon as possible once she is roomed. Dialysis staff stated it will be no earlier than 1730 due to staffing issues, even if she does get in a room prior to that.

## 2024-05-01 NOTE — ED Notes (Signed)
 Dr. Rosario notified of patients facial swelling. No new orders at this time.

## 2024-05-01 NOTE — ED Notes (Signed)
 Ogbata MD paged regarding patient complaining  of developing new wet cough. It is audible.

## 2024-05-01 NOTE — Progress Notes (Signed)
 OT Cancellation Note  Patient Details Name: Ashley Freeman MRN: [id number] DOB: Nov 29, 1990   Cancelled Treatment:    Reason Eval/Treat Not Completed: Patient declined, no reason specified  Kennth Mliss Helling 05/01/2024, 10:53 AM Mliss HERO, OTR/L Acute Rehabilitation Services Office: [phone]

## 2024-05-01 NOTE — Inpatient Diabetes Management (Signed)
 Inpatient Diabetes Program Recommendations  AACE/ADA: New Consensus Statement on Inpatient Glycemic Control (2015)  Target Ranges:  Prepandial:   less than 140 mg/dL      Peak postprandial:   less than 180 mg/dL (1-2 hours)      Critically ill patients:  140 - 180 mg/dL   Lab Results  Component Value Date   GLUCAP >600 (HH) 05/01/2024   HGBA1C 8.5 (H) 03/30/2024    Review of Glycemic Control  Diabetes history: T1DM Outpatient Diabetes medications:  Lantus  5 units every day, Novolog  2-8 units TID Current orders for Inpatient glycemic control: IV insulin   Inpatient Diabetes Program Recommendations:   Patient is currently in ED and left AMA on 04/28/24 and returned to ED on 04/30/24.  Please ready for transition to SQ insulin :   Lantus  5 units every day (allow IV insulin  to run for 2 hrs after administration) Novolog  0-6 units Q4H   Patient very familiar to the DM team.    Thank you, Dagoberto BRAVO. Tammela Bales, RN, MSN, CNS, CDCES  Diabetes Coordinator Inpatient Glycemic Control Team Team Pager [phone] (8am-5pm) 05/01/2024 10:38 AM

## 2024-05-01 NOTE — ED Notes (Signed)
 This RN while trying to assist patient to bathroom using bedpan, pt became agitated and threw bedpan at this RN.

## 2024-05-01 NOTE — Progress Notes (Signed)
 Pre-HD  Patient combative, pulling at lines. Patient is high risk for needle dislodgment and infiltration.  Floor obtained provider order restraints.Nephrologist also notified.

## 2024-05-01 NOTE — ED Notes (Signed)
 Pt screaming out for pain medication

## 2024-05-01 NOTE — ED Notes (Signed)
 This RN consulted pharmacy related to insulin  drip/endo tool, spoke with Lynwood Poplar.

## 2024-05-01 NOTE — Progress Notes (Signed)
 PT Cancellation Note  Patient Details Name: Ashley Freeman MRN: [id number] DOB: 1990/05/05   Cancelled Treatment:    Reason Eval/Treat Not Completed: Patient declined, no reason specified (Initially pt not acknowledging therapist presence, Moaning, stating she needs water ; pt educated RN needs to okay this due to fluid issues. Pt stated leave me alone. Will follow up as able and appropriate.)  Dorothyann Maier, DPT, CLT  Acute Rehabilitation Services Office: [phone] (Secure chat preferred)   Dorothyann VEAR Maier 05/01/2024, 10:52 AM

## 2024-05-01 NOTE — Progress Notes (Signed)
 " PROGRESS NOTE    Ashley Freeman  FMW:[id number] DOB: 12/24/1990 DOA: 04/30/2024 PCP: Keven Crumbly Pap, MD  Outpatient Specialists:     Brief Narrative:  Patient is a 34 year old female past medical history significant for type 1 diabetes mellitus, recurrent DKA, chronic systolic/diastolic heart failure, bipolar disorder, anemia of chronic disease and end-stage renal disease on hemodialysis on Tuesday, Thursday and Saturday.  Patient is well-known to the nephrology team.  Apparently, patient was seen recently at the emergency room.  Patient was admitted with diabetes ketoacidosis.  Swelling of the face, lips and tongue noted by the nephrology team well-known patient from before.  05/01/2024: Patient seen.  Above documentation as noted.  Significant history from patient.  Awaiting hemodialysis.  Will continue insulin  drip.  Nephrology input is highly appreciated.   Assessment & Plan:   Principal Problem:   DKA (diabetic ketoacidosis) (HCC) Active Problems:   End-stage renal disease on hemodialysis (HCC)   Chronic combined systolic and diastolic heart failure (HCC)   Bipolar disorder (HCC)   History of anemia due to chronic kidney disease   Generalized weakness   DKA: - Patient has type 1 diabetes mellitus. - Blood sugar has been uncontrolled. -Admitted with blood sugar of greater than 1000, CO2 of less than 7 and anion gap of 26. - Continue insulin  drip. - Continue to monitor renal function and electrolytes. - Monitor blood sugar and anion gap. - Hold IV fluids as directed by nephrology team.  End-stage renal disease on hemodialysis TTS: - For dialysis today. - Nephrology team is directing care.  Possible angioedema: - EpiPen  0.3 mg  x 1 dose. - Pepcid  20 mg p.o. once daily. - Patient is documented to be allergic to Benadryl . - Will hold steroids for now due to significantly elevated blood sugar.  DVT prophylaxis: Start subcutaneous heparin  5000 units twice  daily. Code Status: Full code Family Communication:  Disposition Plan:    Consultants:  Nephrology.  Procedures:  For hemodialysis.  Antimicrobials:  None.   Subjective: -Patient is a poor historian.  Objective: Vitals:   05/01/24 0652 05/01/24 0850 05/01/24 1100 05/01/24 1104  BP: (!) 149/63 (!) 145/121 121/87   Pulse: 81 80 86   Resp: 20 20 20    Temp: (!) 97.5 F (36.4 C) 98 F (36.7 C)  (!) 97.5 F (36.4 C)  TempSrc: Axillary Oral  Oral  SpO2: 97% 100% 98%   Weight:      Height:       No intake or output data in the 24 hours ending 05/01/24 1325 Filed Weights   04/30/24 1949  Weight: 59.4 kg    Examination:  General exam: Appears chronically ill looking.  Facial and lip swelling.  Periorbital edema.  Respiratory system: Clear to auscultation. Cardiovascular system: S1 & S2 heard Gastrointestinal system: Abdomen is soft and nontender.  Central nervous system: Awake and alert.   Extremities: No leg edema.  Data Reviewed: I have personally reviewed following labs and imaging studies  CBC: Recent Labs  Lab 04/25/24 0720 04/30/24 2028 04/30/24 2134 04/30/24 2135 05/01/24 0443  WBC 16.4* 7.5  --   --  7.7  NEUTROABS  --  3.8  --   --  3.7  HGB 11.1* 9.5* 11.9* 11.9* 9.2*  HCT 33.1* 35.8* 35.0* 35.0* 31.7*  MCV 78.4* 100.0  --   --  91.6  PLT 197 284  --   --  282   Basic Metabolic Panel: Recent Labs  Lab 04/25/24 0728  04/26/24 0429 04/26/24 1744 04/27/24 0444 04/28/24 0550 04/30/24 2128 04/30/24 2134 04/30/24 2135 05/01/24 0443 05/01/24 0905  NA 126* 132*   < > 134* 133* 117* 117* 116* 120* 121*  K 4.0 3.7   < > 3.5 3.9 5.2* 4.8 4.7 4.0 3.6  CL 88* 94*   < > 95* 95* 78* 88*  --  83* 84*  CO2 19* 26   < > 24 19* <7*  --   --  11* 13*  GLUCOSE 223* 129*   < > 129* 173* >1,200* >700*  --  >1,200* 1,131*  BUN 45* 28*   < > 31* 35* 40* 35*  --  40* 42*  CREATININE 4.68* 3.50*   < > 4.27* 5.19* 5.30* 5.30*  --  5.19* 5.37*  CALCIUM  8.6* 7.9*    < > 8.6* 8.9 8.7*  --   --  8.6* 8.7*  MG 1.8 2.0  --  2.1 2.3  --   --   --  2.1  --   PHOS 4.5  4.4 4.0  --  5.2* 6.1*  --   --   --  6.2*  --    < > = values in this interval not displayed.   GFR: Estimated Creatinine Clearance: 12.6 mL/min (A) (by C-G formula based on SCr of 5.37 mg/dL (H)). Liver Function Tests: Recent Labs  Lab 04/25/24 0728  ALBUMIN  2.9*   No results for input(s): LIPASE, AMYLASE in the last 168 hours. No results for input(s): AMMONIA in the last 168 hours. Coagulation Profile: No results for input(s): INR, PROTIME in the last 168 hours. Cardiac Enzymes: No results for input(s): CKTOTAL, CKMB, CKMBINDEX, TROPONINI in the last 168 hours. BNP (last 3 results) Recent Labs    05/01/24 0443  PROBNP >35,000.0*   HbA1C: No results for input(s): HGBA1C in the last 72 hours. CBG: Recent Labs  Lab 05/01/24 0853 05/01/24 0955 05/01/24 1056 05/01/24 1202 05/01/24 1308  GLUCAP >600* >600* >600* >600* >600*   Lipid Profile: No results for input(s): CHOL, HDL, LDLCALC, TRIG, CHOLHDL, LDLDIRECT in the last 72 hours. Thyroid  Function Tests: No results for input(s): TSH, T4TOTAL, FREET4, T3FREE, THYROIDAB in the last 72 hours. Anemia Panel: No results for input(s): VITAMINB12, FOLATE, FERRITIN, TIBC, IRON , RETICCTPCT in the last 72 hours. Urine analysis:    Component Value Date/Time   COLORURINE YELLOW 04/13/2024 1749   APPEARANCEUR HAZY (A) 04/13/2024 1749   APPEARANCEUR Hazy 11/10/2013 2043   LABSPEC 1.018 04/13/2024 1749   LABSPEC 1.031 11/10/2013 2043   PHURINE 7.0 04/13/2024 1749   GLUCOSEU >=500 (A) 04/13/2024 1749   GLUCOSEU >=500 11/10/2013 2043   HGBUR NEGATIVE 04/13/2024 1749   BILIRUBINUR NEGATIVE 04/13/2024 1749   BILIRUBINUR negative 06/04/2018 1035   BILIRUBINUR Negative 11/24/2015 1443   BILIRUBINUR Negative 11/10/2013 2043   KETONESUR 5 (A) 04/13/2024 1749   PROTEINUR >=300 (A)  04/13/2024 1749   UROBILINOGEN 0.2 09/14/2019 1732   NITRITE NEGATIVE 04/13/2024 1749   LEUKOCYTESUR NEGATIVE 04/13/2024 1749   LEUKOCYTESUR 1+ 11/10/2013 2043   Sepsis Labs: @LABRCNTIP (procalcitonin:4,lacticidven:4)  ) Recent Results (from the past 240 hours)  Resp panel by RT-PCR (RSV, Flu A&B, Covid) Anterior Nasal Swab     Status: None   Collection Time: 04/23/24  9:21 AM   Specimen: Anterior Nasal Swab  Result Value Ref Range Status   SARS Coronavirus 2 by RT PCR NEGATIVE NEGATIVE Final   Influenza A by PCR NEGATIVE NEGATIVE Final   Influenza B by  PCR NEGATIVE NEGATIVE Final    Comment: (NOTE) The Xpert Xpress SARS-CoV-2/FLU/RSV plus assay is intended as an aid in the diagnosis of influenza from Nasopharyngeal swab specimens and should not be used as a sole basis for treatment. Nasal washings and aspirates are unacceptable for Xpert Xpress SARS-CoV-2/FLU/RSV testing.  Fact Sheet for Patients: bloggercourse.com  Fact Sheet for Healthcare Providers: seriousbroker.it  This test is not yet approved or cleared by the [place]  FDA and has been authorized for detection and/or diagnosis of SARS-CoV-2 by FDA under an Emergency Use Authorization (EUA). This EUA will remain in effect (meaning this test can be used) for the duration of the COVID-19 declaration under Section 564(b)(1) of the Act, 21 U.S.C. section 360bbb-3(b)(1), unless the authorization is terminated or revoked.     Resp Syncytial Virus by PCR NEGATIVE NEGATIVE Final    Comment: (NOTE) Fact Sheet for Patients: bloggercourse.com  Fact Sheet for Healthcare Providers: seriousbroker.it  This test is not yet approved or cleared by the [place]  FDA and has been authorized for detection and/or diagnosis of SARS-CoV-2 by FDA under an Emergency Use Authorization (EUA). This EUA will remain in effect (meaning  this test can be used) for the duration of the COVID-19 declaration under Section 564(b)(1) of the Act, 21 U.S.C. section 360bbb-3(b)(1), unless the authorization is terminated or revoked.  Performed at [facility], [address] [place], [place] [address]   MRSA Next Gen by PCR, Nasal     Status: None   Collection Time: 04/23/24  3:47 PM   Specimen: Nasal Mucosa; Nasal Swab  Result Value Ref Range Status   MRSA by PCR Next Gen NOT DETECTED NOT DETECTED Final    Comment: (NOTE) The GeneXpert MRSA Assay (FDA approved for NASAL specimens only), is one component of a comprehensive MRSA colonization surveillance program. It is not intended to diagnose MRSA infection nor to guide or monitor treatment for MRSA infections. Test performance is not FDA approved in patients less than 81 years old. Performed at [facility], [address] [place], [place] [address]   Culture, Respiratory w Gram Stain     Status: None   Collection Time: 04/24/24  3:29 AM   Specimen: Tracheal Aspirate; Respiratory  Result Value Ref Range Status   Specimen Description TRACHEAL ASPIRATE  Final   Special Requests NONE  Final   Gram Stain   Final    RARE WBC PRESENT, PREDOMINANTLY PMN NO ORGANISMS SEEN Performed at [facility], [address], [place], [place] [address]    Culture RARE CANDIDA ALBICANS  Final   Report Status 04/26/2024 FINAL  Final  Culture, blood (Routine X 2) w Reflex to ID Panel     Status: None   Collection Time: 04/24/24 12:16 PM   Specimen: BLOOD LEFT HAND  Result Value Ref Range Status   Specimen Description BLOOD LEFT HAND  Final   Special Requests   Final    BOTTLES DRAWN AEROBIC ONLY Blood Culture results may not be optimal due to an inadequate volume of blood received in culture bottles   Culture   Final    NO GROWTH 5 DAYS Performed at [facility], [address], [place], [place] [address]    Report Status 04/29/2024 FINAL  Final  Culture,  blood (Routine X 2) w Reflex to ID Panel     Status: None   Collection Time: 04/24/24 12:17 PM   Specimen: BLOOD LEFT HAND  Result  Value Ref Range Status   Specimen Description BLOOD LEFT HAND  Final   Special Requests   Final    BOTTLES DRAWN AEROBIC ONLY Blood Culture results may not be optimal due to an inadequate volume of blood received in culture bottles   Culture   Final    NO GROWTH 5 DAYS Performed at [facility], [address], [place], [place] [address]    Report Status 04/29/2024 FINAL  Final         Radiology Studies: DG Chest Port 1 View Result Date: 04/30/2024 EXAM: 1 VIEW(S) XRAY OF THE CHEST 04/30/2024 08:32:51 PM COMPARISON: 04/24/2024 CLINICAL HISTORY: SOB FINDINGS: LUNGS AND PLEURA: Low lung volumes. Perihilar vascular fullness with bilateral interstitial prominence. Decreased retrocardiac opacity. Trace right pleural effusion. No focal pulmonary opacity. No pneumothorax. HEART AND MEDIASTINUM: Unchanged cardiomegaly. BONES AND SOFT TISSUES: No acute osseous abnormality. IMPRESSION: 1. Mild pulmonary vascular congestion and interstitial edema. 2. Trace right pleural effusion. 3. Unchanged cardiomegaly. Electronically signed by: Greig Pique MD 04/30/2024 08:38 PM EST RP Workstation: [id number]        Scheduled Meds:  Chlorhexidine  Gluconate Cloth  6 each Topical Q0600   Continuous Infusions:  dextrose  5% lactated ringers      insulin  6.5 Units/hr (05/01/24 1204)     LOS: 0 days    Time spent: 55 minutes.    Leatrice Chapel, MD  [facility] Pager #: [phone] 7PM-7AM contact night coverage as above    "

## 2024-05-01 NOTE — Progress Notes (Signed)
 Navigator received a call from pt's out-pt HD clinic this morning to inquire if pt had been re-hospitalized since pt did not come to treatment yesterday. Clinic advised pt currently in the ED. Pt receives out-pt HD at [facility] on TTS. Will assist as needed.  Randine Mungo Dialysis Navigator [phone]

## 2024-05-01 NOTE — Progress Notes (Signed)
 Arrived to bedside for PIV consult. Upon assessment, left  lower forearm infiltration noted and patent left upper arm ultrasound guided IV present. No compressible veins viewed under ultrasound above prior infiltrated IV site. Primary nurse aware of IV Team assessment and informed to contact provider for vascular access.

## 2024-05-01 NOTE — Consult Note (Addendum)
 [facility] Renal Consultation Note    Indication for Consultation:  Management of ESRD/hemodialysis, anemia, hypertension/volume, and secondary hyperparathyroidism.  HPI: Ashley Freeman is a 34 y.o. female with PMH including ESRD on dialysis, T1DM, recent DKA, chronic HF, anemia, who presented to [facility] with DKA. She was recently admitted for seizure disorder, acute metabolic encephalopathy, and hyperkalemia and discharged on 04/29/24. She complained of generalized weakness. Labs were notable for CBG >600. She was given fentanyl , saline and insulin  in the ED. Nephrology was consulted for management of ESRD. Her last dialysis was here on 04/28/24. Patient seem in ED. BS still remains elevated >1000. She appears fatigued, says she is tired and doesn't feel good. Reports nausea, abdominal pain and body aches. Denies SOB, CP, palpitations, dizziness, HA, blurry vision, diarrhea, fevers, chills. She has notable facial edema which she reports is chronic. Denies history of angioedema and SVC syndrome but is very uncomfortable and unable to provide much more history. She is on entresto  outpatient.   Past Medical History:  Diagnosis Date   Abscess, gluteal, right 08/24/2013   Anemia 02/19/2012   Bartholin's gland abscess 09/19/2013   Bipolar disorder (HCC)    BV (bacterial vaginosis) 11/24/2015   Depression    Diabetes mellitus type I (HCC) 2001   Diagnosed at age 76 ; Type I   Diarrhea 05/30/2016   DKA (diabetic ketoacidoses) 08/19/2013   Also in 2018   ESRD (end stage renal disease) (HCC)    Gonorrhea 08/2011   Treated in 09/2011   HFrEF (heart failure with reduced ejection fraction) (HCC)    a. 2022 Echo: EF 40%; b. 10/2021 Echo: EF 55%; b. 07/2022 MV: No ischemia. EF 31%; c. 08/2022 Echo: EF 35%, mildly dil RV, sev TR.   History of trichomoniasis 05/31/2016   Hyperlipidemia 03/28/2016   Hypertension    NICM (nonischemic cardiomyopathy) (HCC)    Sepsis (HCC) 09/19/2013    Past Surgical History:  Procedure Laterality Date   A/V FISTULAGRAM Right 06/17/2023   Procedure: A/V Fistulagram;  Surgeon: Marea Selinda GORMAN, MD;  Location: [facility] INVASIVE CV LAB;  Service: Cardiovascular;  Laterality: Right;   A/V SHUNT INTERVENTION N/A 09/25/2023   Procedure: A/V SHUNT INTERVENTION;  Surgeon: Pearline Norman GORMAN, MD;  Location: [facility] PV LAB;  Service: Cardiovascular;  Laterality: N/A;   AV FISTULA PLACEMENT Right 07/06/2022   Procedure: ARTERIOVENOUS GRAFT CREATION;  Surgeon: Gretta Lonni PARAS, MD;  Location: [facility] OR;  Service: Vascular;  Laterality: Right;   CESAREAN SECTION N/A 10/05/2019   Procedure: CESAREAN SECTION;  Surgeon: Izell Harari, MD;  Location: [facility] LD ORS;  Service: Obstetrics;  Laterality: N/A;   CHOLECYSTECTOMY N/A 07/02/2023   Procedure: LAPAROSCOPIC CHOLECYSTECTOMY;  Surgeon: Ebbie Cough, MD;  Location: [facility] OR;  Service: General;  Laterality: N/A;   ESOPHAGOGASTRODUODENOSCOPY N/A 01/31/2024   Procedure: EGD (ESOPHAGOGASTRODUODENOSCOPY);  Surgeon: San Sandor GAILS, DO;  Location: [facility] ENDOSCOPY;  Service: Gastroenterology;  Laterality: N/A;   INCISION AND DRAINAGE ABSCESS Left 09/28/2019   Procedure: INCISION AND DRAINAGE VULVAR ABCESS;  Surgeon: Edsel Norleen GAILS, MD;  Location: [facility] OR;  Service: Gynecology;  Laterality: Left;   INCISION AND DRAINAGE ABSCESS Right 02/23/2024   Procedure: INCISION AND DRAINAGE, ABSCESS;  Surgeon: Tobie Eldora NOVAK, MD;  Location: [facility] OR;  Service: ENT;  Laterality: Right;   INCISION AND DRAINAGE PERIRECTAL ABSCESS Right 08/18/2013   Procedure: IRRIGATION AND DEBRIDEMENT GLUTEAL ABSCESS;  Surgeon: Lynda Leos, MD;  Location: [facility] OR;  Service: General;  Laterality: Right;  INCISION AND DRAINAGE PERIRECTAL ABSCESS Right 09/19/2013   Procedure: IRRIGATION AND DEBRIDEMENT RIGHT GLUTEAL AND LABIAL ABSCESSES;  Surgeon: Lynda Leos, MD;  Location: [facility] OR;  Service: General;  Laterality: Right;   INCISION AND DRAINAGE PERIRECTAL  ABSCESS Right 09/24/2013   Procedure: IRRIGATION AND DEBRIDEMENT PERIRECTAL ABSCESS;  Surgeon: Lynwood MALVA Pina, MD;  Location: [facility] OR;  Service: General;  Laterality: Right;   IR PARACENTESIS  08/28/2023   IR PARACENTESIS  11/04/2023   IR PARACENTESIS  12/23/2023   IR PARACENTESIS  02/04/2024   IR PARACENTESIS  03/23/2024   IR PARACENTESIS  04/06/2024   Family History  Problem Relation Age of Onset   Asthma Mother    Carpal tunnel syndrome Mother    Gout Father    Diabetes Paternal Grandmother    Anesthesia problems Neg Hx    Social History:  reports that she has never smoked. She has never been exposed to tobacco smoke. She has never used smokeless tobacco. She reports that she does not currently use alcohol . She reports that she does not use drugs.  ROS: As per HPI otherwise negative.  Physical Exam: Vitals:   05/01/24 0230 05/01/24 0242 05/01/24 0245 05/01/24 0652  BP: (!) 170/90 (!) 196/87  (!) 149/63  Pulse: 81 83 97 81  Resp: 18 18 17 20   Temp:  (!) 97.5 F (36.4 C)  (!) 97.5 F (36.4 C)  TempSrc:  Oral  Axillary  SpO2: 100% 99% 100% 97%  Weight:      Height:         General: Tired appearing female, NAD Head: + periorbital and generalized facial edema, no tongue swelling noted Lungs: Clear bilaterally to auscultation without wheezes, rales, or rhonchi. Breathing is unlabored. Heart: RRR with normal S1, S2. No murmurs, rubs, or gallops appreciated. Abdomen: Soft, non-distended with normoactive bowel sounds. No rebound/guarding. No obvious abdominal masses. Musculoskeletal:  Strength and tone appear normal for age. Lower extremities: trace edema b/l lower extremities Neuro: Alert and oriented X 3. Moves all extremities spontaneously. Dialysis Access: RUE AVG + t/b  Allergies[1] Prior to Admission medications  Medication Sig Start Date End Date Taking? Authorizing Provider  albuterol  (VENTOLIN  HFA) 108 (90 Base) MCG/ACT inhaler Inhale 2 puffs into the lungs every 4 (four)  hours as needed for wheezing or shortness of breath. 11/24/22   [provider]  bumetanide  (BUMEX ) 2 MG tablet Take 10 mg by mouth daily.    [provider]  carvedilol  (COREG ) 25 MG tablet Take 1 tablet (25 mg total) by mouth 2 (two) times daily with a meal. Follow with your PCP for refills. 04/18/24 05/18/24  Perri DELENA Meliton Mickey., MD  Cinnamon 500 MG capsule Take 500 mg by mouth in the morning.    [provider]  diazePAM , 15 MG Dose, 2 x 7.5 MG/0.1ML LQPK Place 15 mg into the nose as needed (sz lasting over 2 minutes). 04/28/24   Yadav, Priyanka O, MD  DULoxetine  (CYMBALTA ) 20 MG capsule Take 20 mg by mouth daily. 07/29/23   [provider]  Elderberry-Vitamin C-Zinc  (ELDERBERRY IMMUNE HEALTH GUMMY PO) Take 2 each by mouth in the morning.    [provider]  fluticasone  (FLONASE ) 50 MCG/ACT nasal spray Place 2 sprays into both nostrils daily as needed for allergies or rhinitis. 12/11/23 12/10/24  [provider]  hydrOXYzine  (ATARAX ) 25 MG tablet Take 25 mg by mouth 3 (three) times daily as needed for anxiety, itching, nausea or vomiting.    [provider]  lamoTRIgine  (LAMICTAL ) 25 MG tablet Take 25 mg (1 tablet) daily for 10 days.   Then take 50 mg (2 tablets) daily for 14 days.   Then take 100 mg (4 tablets) daily for 7 days.   Then take 100 mg (4 tablets) in the morning and 25 mg (1 tablet) at night for 7 days.   Then take 100 mg (4 tablets) in the morning and 50 mg (2 tablets) at night for 7 days.   Then take 100 mg (4 tablets) twice a day.  Continue this dose.  Follow with your PCP for refills. 04/18/24   Perri DELENA Meliton Mickey., MD  LANTUS  SOLOSTAR 100 UNIT/ML Solostar Pen Inject 5 Units into the skin daily. 04/18/24   Perri DELENA Meliton Mickey., MD  levETIRAcetam  (KEPPRA ) 250 MG tablet Take 2 tablets (500 mg total) by mouth every morning AND 1 tablet (250 mg total) every Tuesday, Thursday, and Saturday at 6 PM. (After dialysis).  04/18/24 05/30/24  Perri DELENA Meliton Mickey., MD  NOVOLOG  FLEXPEN 100 UNIT/ML FlexPen Before each meal 3 times a day, 140-199 - 2 units, 200-250 - 4 units, 251-299 - 6 units,  300-349 - 7 units,  350 or above 8 units. 03/30/24   Dennise Lavada POUR, MD  OLANZapine  (ZYPREXA ) 5 MG tablet Take 5 mg by mouth at bedtime.    [provider]  Pancrelipase , Lip-Prot-Amyl, 3000-9500 units CPEP Take 3,000 Units by mouth in the morning, at noon, and at bedtime.    [provider]  pantoprazole  (PROTONIX ) 40 MG tablet Take 1 tablet (40 mg total) by mouth 2 (two) times daily. 02/07/24 04/10/24  Drusilla Sabas RAMAN, MD  prochlorperazine  (COMPAZINE ) 5 MG tablet Take 5 mg by mouth every 6 (six) hours as needed for nausea or vomiting.    [provider]  [Paused] rosuvastatin  (CRESTOR ) 40 MG tablet Take 40 mg by mouth daily. Wait to take this until your doctor or other care provider tells you to start again. 01/20/24 01/19/25  [provider]  sacubitril -valsartan  (ENTRESTO ) 24-26 MG Take 1 tablet by mouth 2 (two) times daily. Follow with your PCP/outpatient provider for refills 04/18/24 05/18/24  Perri DELENA Meliton Mickey., MD  sevelamer  carbonate (RENVELA ) 800 MG tablet Take 1 tablet (800 mg total) by mouth 3 (three) times daily with meals. 02/07/24   Drusilla Sabas RAMAN, MD  sodium bicarbonate  650 MG tablet Take 650 mg by mouth 2 (two) times daily. Patient not taking: Reported on 04/07/2024 07/29/23   [provider]  SUMAtriptan  (IMITREX ) 50 MG tablet Take 50 mg by mouth every 2 (two) hours as needed for migraine or headache. 06/20/22   [provider]  VELTASSA  16.8 g PACK Take 1 Dose by mouth daily at 12 noon. 04/03/24   [provider]   Current Facility-Administered Medications  Medication Dose Route Frequency Provider Last Rate Last Admin   acetaminophen  (TYLENOL ) tablet 650 mg  650 mg Oral Q6H PRN Howerter, Justin B, DO       Or   acetaminophen  (TYLENOL ) suppository 650  mg  650 mg Rectal Q6H PRN Howerter, Justin B, DO       dextrose  5 % in lactated ringers  infusion   Intravenous Continuous Howerter, Justin B, DO       dextrose  50 % solution 0-50 mL  0-50 mL Intravenous PRN Howerter, Justin B, DO       fentaNYL  (SUBLIMAZE ) injection 12.5 mcg  12.5 mcg Intravenous Q2H PRN Howerter, Justin B,  DO   12.5 mcg at 05/01/24 0536   insulin  regular, human (MYXREDLIN ) 100 units/ 100 mL infusion   Intravenous Continuous Howerter, Justin B, DO 6.5 mL/hr at 05/01/24 0755 6.5 Units/hr at 05/01/24 0755   lactated ringers  infusion   Intravenous Continuous Howerter, Justin B, DO 75 mL/hr at 05/01/24 9663 Restarted at 05/01/24 0336   melatonin tablet 3 mg  3 mg Oral QHS PRN Howerter, Justin B, DO       ondansetron  (ZOFRAN ) injection 4 mg  4 mg Intravenous Q6H PRN Howerter, Justin B, DO       Current Outpatient Medications  Medication Sig Dispense Refill   albuterol  (VENTOLIN  HFA) 108 (90 Base) MCG/ACT inhaler Inhale 2 puffs into the lungs every 4 (four) hours as needed for wheezing or shortness of breath.     bumetanide  (BUMEX ) 2 MG tablet Take 10 mg by mouth daily.     carvedilol  (COREG ) 25 MG tablet Take 1 tablet (25 mg total) by mouth 2 (two) times daily with a meal. Follow with your PCP for refills. 60 tablet 0   Cinnamon 500 MG capsule Take 500 mg by mouth in the morning.     diazePAM , 15 MG Dose, 2 x 7.5 MG/0.1ML LQPK Place 15 mg into the nose as needed (sz lasting over 2 minutes). 5 each 3   DULoxetine  (CYMBALTA ) 20 MG capsule Take 20 mg by mouth daily.     Elderberry-Vitamin C-Zinc  (ELDERBERRY IMMUNE HEALTH GUMMY PO) Take 2 each by mouth in the morning.     fluticasone  (FLONASE ) 50 MCG/ACT nasal spray Place 2 sprays into both nostrils daily as needed for allergies or rhinitis.     hydrOXYzine  (ATARAX ) 25 MG tablet Take 25 mg by mouth 3 (three) times daily as needed for anxiety, itching, nausea or vomiting.     lamoTRIgine  (LAMICTAL ) 25 MG tablet Take 25 mg (1 tablet) daily  for 10 days.   Then take 50 mg (2 tablets) daily for 14 days.   Then take 100 mg (4 tablets) daily for 7 days.   Then take 100 mg (4 tablets) in the morning and 25 mg (1 tablet) at night for 7 days.   Then take 100 mg (4 tablets) in the morning and 50 mg (2 tablets) at night for 7 days.   Then take 100 mg (4 tablets) twice a day.  Continue this dose.  Follow with your PCP for refills. 199 tablet 0   LANTUS  SOLOSTAR 100 UNIT/ML Solostar Pen Inject 5 Units into the skin daily.     levETIRAcetam  (KEPPRA ) 250 MG tablet Take 2 tablets (500 mg total) by mouth every morning AND 1 tablet (250 mg total) every Tuesday, Thursday, and Saturday at 6 PM. (After dialysis). 102 tablet 0   NOVOLOG  FLEXPEN 100 UNIT/ML FlexPen Before each meal 3 times a day, 140-199 - 2 units, 200-250 - 4 units, 251-299 - 6 units,  300-349 - 7 units,  350 or above 8 units.     OLANZapine  (ZYPREXA ) 5 MG tablet Take 5 mg by mouth at bedtime.     Pancrelipase , Lip-Prot-Amyl, 3000-9500 units CPEP Take 3,000 Units by mouth in the morning, at noon, and at bedtime.     pantoprazole  (PROTONIX ) 40 MG tablet Take 1 tablet (40 mg total) by mouth 2 (two) times daily. 120 tablet 0   prochlorperazine  (COMPAZINE ) 5 MG tablet Take 5 mg by mouth every 6 (six) hours as needed for nausea or vomiting.     [Paused] rosuvastatin  (  CRESTOR ) 40 MG tablet Take 40 mg by mouth daily.     sacubitril -valsartan  (ENTRESTO ) 24-26 MG Take 1 tablet by mouth 2 (two) times daily. Follow with your PCP/outpatient provider for refills 60 tablet 0   sevelamer  carbonate (RENVELA ) 800 MG tablet Take 1 tablet (800 mg total) by mouth 3 (three) times daily with meals. 90 tablet 2   sodium bicarbonate  650 MG tablet Take 650 mg by mouth 2 (two) times daily. (Patient not taking: Reported on 04/07/2024)     SUMAtriptan  (IMITREX ) 50 MG tablet Take 50 mg by mouth every 2 (two) hours as needed for migraine or headache.     VELTASSA  16.8 g PACK Take 1 Dose by mouth daily at 12 noon.      Labs: Basic Metabolic Panel: Recent Labs  Lab 04/27/24 0444 04/28/24 0550 04/30/24 2128 04/30/24 2134 04/30/24 2135 05/01/24 0443  NA 134* 133* 117* 117* 116* 120*  K 3.5 3.9 5.2* 4.8 4.7 4.0  CL 95* 95* 78* 88*  --  83*  CO2 24 19* <7*  --   --  11*  GLUCOSE 129* 173* >1,200* >700*  --  >1,200*  BUN 31* 35* 40* 35*  --  40*  CREATININE 4.27* 5.19* 5.30* 5.30*  --  5.19*  CALCIUM  8.6* 8.9 8.7*  --   --  8.6*  PHOS 5.2* 6.1*  --   --   --  6.2*   Liver Function Tests: Recent Labs  Lab 04/25/24 0728  ALBUMIN  2.9*   No results for input(s): LIPASE, AMYLASE in the last 168 hours. No results for input(s): AMMONIA in the last 168 hours. CBC: Recent Labs  Lab 04/25/24 0720 04/30/24 2028 04/30/24 2134 04/30/24 2135 05/01/24 0443  WBC 16.4* 7.5  --   --  7.7  NEUTROABS  --  3.8  --   --  3.7  HGB 11.1* 9.5* 11.9* 11.9* 9.2*  HCT 33.1* 35.8* 35.0* 35.0* 31.7*  MCV 78.4* 100.0  --   --  91.6  PLT 197 284  --   --  282   Cardiac Enzymes: No results for input(s): CKTOTAL, CKMB, CKMBINDEX, TROPONINI in the last 168 hours. CBG: Recent Labs  Lab 05/01/24 0319 05/01/24 0443 05/01/24 0535 05/01/24 0652 05/01/24 0752  GLUCAP >600* >600* >600* >600* >600*   Iron  Studies: No results for input(s): IRON , TIBC, TRANSFERRIN, FERRITIN in the last 72 hours. Studies/Results: DG Chest Port 1 View Result Date: 04/30/2024 EXAM: 1 VIEW(S) XRAY OF THE CHEST 04/30/2024 08:32:51 PM COMPARISON: 04/24/2024 CLINICAL HISTORY: SOB FINDINGS: LUNGS AND PLEURA: Low lung volumes. Perihilar vascular fullness with bilateral interstitial prominence. Decreased retrocardiac opacity. Trace right pleural effusion. No focal pulmonary opacity. No pneumothorax. HEART AND MEDIASTINUM: Unchanged cardiomegaly. BONES AND SOFT TISSUES: No acute osseous abnormality. IMPRESSION: 1. Mild pulmonary vascular congestion and interstitial edema. 2. Trace right pleural effusion. 3. Unchanged  cardiomegaly. Electronically signed by: Greig Pique MD 04/30/2024 08:38 PM EST RP Workstation: [id number]    Dialysis Orders:  [facility] [address] [id number]), TTS 3:45hr, 400/800, EDW 57.5kg, 2K/2.5Ca, R AVG Heparin  bolus 1600 units with HD Calcitriol  1mcg Sensipar  60mg  with HD  TUMS 1500mg  with HD   Assessment/Plan:  DKA: T1DM, recurrent DKA. Management per admitting team. Glucose remains severely elevated.   ESRD:  Typically on TTS schedule but last HD was 12/30 due to holiday/ED visit. Cannot bring her up to the HD unit on an insulin  drip but will prioritize her for bedside dialysis once she is  admitted to a room.   Hypertension/volume: BP moderately elevated, CXR showed mild pulmonary vascular congestion. Reducing IV fluids. UF with HD as tolerated.   Anemia: Hgb 11.9 > 9.2. Follow trend and will start ESA as needed  Metabolic bone disease: Calcium  controlled, phos elevated. Continue calcitriol , sensipar  and renvela  once tolerating PO  Nutrition:  Currently NPO Facial edema: Reportedly chronic per pt and there are notes about periorbital edema from last admission. Not in any respiratory distress. May be volume overload component but recommend we also hold her ARB (entresto ).   Lucie Collet, PA-C 05/01/2024, 8:19 AM  [facility] Pager: [phone]   Seen and examined independently.  Agree with note and exam as documented above by physician extender and as noted here.  Patient with ESRD on HD, medical noncompliance who represented to the hospital after a recent admit with seizure disorder and encephalopathy.  She had left AMA earlier this week.  Patient was found to have DKA and initial blood glucose > 1200 and now with worsening periorbital edema and lip swelling, concerning for an allergic reaction.  She is listed as being on entresto  (which we will stop).  She received fluids earlier as part of DKA orders - these were stopped this AM.    General adult female in stretcher, short of breath HEENT NCAT; marked periorbital edema and lip swelling (new) Neck supple trachea midline Lungs coarse breath sounds; increased work of breathing with exertion - some improvement with repositioning  Heart S1S2 no rub Abdomen soft nontender nondistended Extremities trace lower extremity edema  Neuro - she answers questions but is understandably more focused on her breathing Access RUE AVG with bruit   Facial swelling, allergic reaction - Reached out to primary team and they are giving epi and pepcid .  - Expediting dialysis.  We are going to dialyze her in the ER when the next available RN can do a separate treatment - spoke with primary team, ED RN, HD charge RN - added entresto  to allergy list- would not resume  ESRD  - HD now to optimize respiratory status  - Usually per TTS schedule but noncompliant  - anticipate HD on 1/3, as well, and then assess needs daily   DKA - Stop fluids - Can add back a low rate of dextrose -containing fluids per the primary team if needed per a cautious DKA protocol.  Would defer other fluids given her volume overload and respiratory distress   HTN  - optimize volume status with HD - fluids off this AM  Anemia of CKD - anticipate ESA soon - will need to see when rec'd her last dose   Metabolic bone disease  - resume home regimen once tolerating PO.  Here with DKA   Thank you for the consult.  Please do not hesitate to contact me with any questions regarding our patient   Katheryn JAYSON Saba, MD 4:36 PM 05/01/2024     [1]  Allergies Allergen Reactions   Keflex  [Cephalexin ] Anaphylaxis    Ceftriaxone  in the past with no reaction   Penicillins Anaphylaxis, Hives and Rash   Vibramycin  [Doxycycline ] Anaphylaxis   Benadryl  [Diphenhydramine ] Itching   Dilaudid  [Hydromorphone ] Itching   Methotrexate And Trimetrexate Rash   Roxicodone  [Oxycodone ] Itching    Takes Percocet without issue

## 2024-05-01 NOTE — ED Notes (Signed)
 Walked in to patient room and pt had all monitoring cords off and has ripped out both IV's. Pt also had brief and gown off and thrown in the floor. Pt continues to scream out that she is cold and ask for pain medication. Pt advised we had pain medication to give but now can't because she ripped her IV's out. MD Howerter notified. ED nurse asked to attempt US  IV and IV team order will be placed.

## 2024-05-02 ENCOUNTER — Other Ambulatory Visit (HOSPITAL_COMMUNITY): Payer: Self-pay

## 2024-05-02 DIAGNOSIS — Z992 Dependence on renal dialysis: Secondary | ICD-10-CM | POA: Diagnosis not present

## 2024-05-02 DIAGNOSIS — F319 Bipolar disorder, unspecified: Secondary | ICD-10-CM

## 2024-05-02 DIAGNOSIS — R531 Weakness: Secondary | ICD-10-CM

## 2024-05-02 DIAGNOSIS — E1010 Type 1 diabetes mellitus with ketoacidosis without coma: Secondary | ICD-10-CM | POA: Diagnosis not present

## 2024-05-02 DIAGNOSIS — N186 End stage renal disease: Secondary | ICD-10-CM

## 2024-05-02 DIAGNOSIS — I5042 Chronic combined systolic (congestive) and diastolic (congestive) heart failure: Secondary | ICD-10-CM | POA: Diagnosis not present

## 2024-05-02 LAB — BASIC METABOLIC PANEL WITH GFR
Anion gap: 16 — ABNORMAL HIGH (ref 5–15)
Anion gap: 15 (ref 5–15)
Anion gap: 15 (ref 5–15)
Anion gap: 15 (ref 5–15)
Anion gap: 14 (ref 5–15)
Anion gap: 15 (ref 5–15)
BUN: 30 mg/dL — ABNORMAL HIGH (ref 6–20)
BUN: 30 mg/dL — ABNORMAL HIGH (ref 6–20)
BUN: 30 mg/dL — ABNORMAL HIGH (ref 6–20)
BUN: 31 mg/dL — ABNORMAL HIGH (ref 6–20)
BUN: 32 mg/dL — ABNORMAL HIGH (ref 6–20)
BUN: 34 mg/dL — ABNORMAL HIGH (ref 6–20)
CO2: 20 mmol/L — ABNORMAL LOW (ref 22–32)
CO2: 19 mmol/L — ABNORMAL LOW (ref 22–32)
CO2: 21 mmol/L — ABNORMAL LOW (ref 22–32)
CO2: 20 mmol/L — ABNORMAL LOW (ref 22–32)
CO2: 19 mmol/L — ABNORMAL LOW (ref 22–32)
CO2: 22 mmol/L (ref 22–32)
Calcium: 8.4 mg/dL — ABNORMAL LOW (ref 8.9–10.3)
Calcium: 8.3 mg/dL — ABNORMAL LOW (ref 8.9–10.3)
Calcium: 8.6 mg/dL — ABNORMAL LOW (ref 8.9–10.3)
Calcium: 8.5 mg/dL — ABNORMAL LOW (ref 8.9–10.3)
Calcium: 8.2 mg/dL — ABNORMAL LOW (ref 8.9–10.3)
Calcium: 8.3 mg/dL — ABNORMAL LOW (ref 8.9–10.3)
Chloride: 93 mmol/L — ABNORMAL LOW (ref 98–111)
Chloride: 94 mmol/L — ABNORMAL LOW (ref 98–111)
Chloride: 93 mmol/L — ABNORMAL LOW (ref 98–111)
Chloride: 93 mmol/L — ABNORMAL LOW (ref 98–111)
Chloride: 94 mmol/L — ABNORMAL LOW (ref 98–111)
Chloride: 93 mmol/L — ABNORMAL LOW (ref 98–111)
Creatinine, Ser: 4.37 mg/dL — ABNORMAL HIGH (ref 0.44–1.00)
Creatinine, Ser: 4.36 mg/dL — ABNORMAL HIGH (ref 0.44–1.00)
Creatinine, Ser: 4.42 mg/dL — ABNORMAL HIGH (ref 0.44–1.00)
Creatinine, Ser: 4.55 mg/dL — ABNORMAL HIGH (ref 0.44–1.00)
Creatinine, Ser: 4.66 mg/dL — ABNORMAL HIGH (ref 0.44–1.00)
Creatinine, Ser: 4.85 mg/dL — ABNORMAL HIGH (ref 0.44–1.00)
GFR, Estimated: 13 mL/min — ABNORMAL LOW
GFR, Estimated: 13 mL/min — ABNORMAL LOW
GFR, Estimated: 13 mL/min — ABNORMAL LOW
GFR, Estimated: 12 mL/min — ABNORMAL LOW
GFR, Estimated: 12 mL/min — ABNORMAL LOW
GFR, Estimated: 12 mL/min — ABNORMAL LOW
Glucose, Bld: 166 mg/dL — ABNORMAL HIGH (ref 70–99)
Glucose, Bld: 119 mg/dL — ABNORMAL HIGH (ref 70–99)
Glucose, Bld: 172 mg/dL — ABNORMAL HIGH (ref 70–99)
Glucose, Bld: 181 mg/dL — ABNORMAL HIGH (ref 70–99)
Glucose, Bld: 111 mg/dL — ABNORMAL HIGH (ref 70–99)
Glucose, Bld: 235 mg/dL — ABNORMAL HIGH (ref 70–99)
Potassium: 3.7 mmol/L (ref 3.5–5.1)
Potassium: 3.6 mmol/L (ref 3.5–5.1)
Potassium: 3.9 mmol/L (ref 3.5–5.1)
Potassium: 3.9 mmol/L (ref 3.5–5.1)
Potassium: 3.7 mmol/L (ref 3.5–5.1)
Potassium: 3.3 mmol/L — ABNORMAL LOW (ref 3.5–5.1)
Sodium: 128 mmol/L — ABNORMAL LOW (ref 135–145)
Sodium: 128 mmol/L — ABNORMAL LOW (ref 135–145)
Sodium: 128 mmol/L — ABNORMAL LOW (ref 135–145)
Sodium: 128 mmol/L — ABNORMAL LOW (ref 135–145)
Sodium: 127 mmol/L — ABNORMAL LOW (ref 135–145)
Sodium: 130 mmol/L — ABNORMAL LOW (ref 135–145)

## 2024-05-02 LAB — GLUCOSE, CAPILLARY
Glucose-Capillary: 109 mg/dL — ABNORMAL HIGH (ref 70–99)
Glucose-Capillary: 117 mg/dL — ABNORMAL HIGH (ref 70–99)
Glucose-Capillary: 119 mg/dL — ABNORMAL HIGH (ref 70–99)
Glucose-Capillary: 129 mg/dL — ABNORMAL HIGH (ref 70–99)
Glucose-Capillary: 133 mg/dL — ABNORMAL HIGH (ref 70–99)
Glucose-Capillary: 136 mg/dL — ABNORMAL HIGH (ref 70–99)
Glucose-Capillary: 138 mg/dL — ABNORMAL HIGH (ref 70–99)
Glucose-Capillary: 147 mg/dL — ABNORMAL HIGH (ref 70–99)
Glucose-Capillary: 149 mg/dL — ABNORMAL HIGH (ref 70–99)
Glucose-Capillary: 152 mg/dL — ABNORMAL HIGH (ref 70–99)
Glucose-Capillary: 155 mg/dL — ABNORMAL HIGH (ref 70–99)
Glucose-Capillary: 156 mg/dL — ABNORMAL HIGH (ref 70–99)
Glucose-Capillary: 178 mg/dL — ABNORMAL HIGH (ref 70–99)
Glucose-Capillary: 199 mg/dL — ABNORMAL HIGH (ref 70–99)
Glucose-Capillary: 200 mg/dL — ABNORMAL HIGH (ref 70–99)
Glucose-Capillary: 206 mg/dL — ABNORMAL HIGH (ref 70–99)
Glucose-Capillary: 206 mg/dL — ABNORMAL HIGH (ref 70–99)
Glucose-Capillary: 210 mg/dL — ABNORMAL HIGH (ref 70–99)
Glucose-Capillary: 219 mg/dL — ABNORMAL HIGH (ref 70–99)
Glucose-Capillary: 231 mg/dL — ABNORMAL HIGH (ref 70–99)

## 2024-05-02 LAB — BETA-HYDROXYBUTYRIC ACID: Beta-Hydroxybutyric Acid: 0.18 mmol/L (ref 0.05–0.27)

## 2024-05-02 LAB — CBC
HCT: 35.2 % — ABNORMAL LOW (ref 36.0–46.0)
Hemoglobin: 12 g/dL (ref 12.0–15.0)
MCH: 26.2 pg (ref 26.0–34.0)
MCHC: 34.1 g/dL (ref 30.0–36.0)
MCV: 76.9 fL — ABNORMAL LOW (ref 80.0–100.0)
Platelets: 247 K/uL (ref 150–400)
RBC: 4.58 MIL/uL (ref 3.87–5.11)
RDW: 13.3 % (ref 11.5–15.5)
WBC: 14.5 K/uL — ABNORMAL HIGH (ref 4.0–10.5)
nRBC: 0.1 % (ref 0.0–0.2)

## 2024-05-02 LAB — MAGNESIUM: Magnesium: 1.9 mg/dL (ref 1.7–2.4)

## 2024-05-02 LAB — LAMOTRIGINE LEVEL: Lamotrigine Lvl: <1 ug/mL — ABNORMAL LOW (ref 2.0–20.0)

## 2024-05-02 MED ORDER — PANTOPRAZOLE SODIUM 40 MG PO TBEC
40.0000 mg | DELAYED_RELEASE_TABLET | Freq: Two times a day (BID) | ORAL | Status: DC
  Administered 2024-05-02 – 2024-05-04 (×4): 40 mg via ORAL
  Filled 2024-05-02 (×5): qty 1

## 2024-05-02 MED ORDER — SODIUM CHLORIDE 0.9 % IV SOLN
50.0000 mg | INTRAVENOUS | Status: DC
  Filled 2024-05-02: qty 5

## 2024-05-02 MED ORDER — DULOXETINE HCL 20 MG PO CPEP
20.0000 mg | ORAL_CAPSULE | Freq: Every day | ORAL | Status: DC
  Administered 2024-05-03 – 2024-05-04 (×2): 20 mg via ORAL
  Filled 2024-05-02 (×3): qty 1

## 2024-05-02 MED ORDER — OLANZAPINE 5 MG PO TABS: 5.0000 mg | ORAL_TABLET | Freq: Every day | ORAL | Status: DC

## 2024-05-02 MED ORDER — INSULIN ASPART 100 UNIT/ML IJ SOLN: 0.0000 [IU] | Freq: Every day | INTRAMUSCULAR | Status: DC

## 2024-05-02 MED ORDER — LABETALOL HCL 5 MG/ML IV SOLN: 10.0000 mg | INTRAVENOUS | Status: DC | PRN

## 2024-05-02 MED ORDER — HYDRALAZINE HCL 20 MG/ML IJ SOLN: 10.0000 mg | Freq: Four times a day (QID) | INTRAMUSCULAR | Status: DC | PRN

## 2024-05-02 MED ORDER — FAMOTIDINE 20 MG PO TABS
10.0000 mg | ORAL_TABLET | Freq: Every day | ORAL | Status: DC
  Administered 2024-05-03 – 2024-05-04 (×2): 10 mg via ORAL
  Filled 2024-05-02 (×2): qty 1

## 2024-05-02 MED ORDER — CARVEDILOL 25 MG PO TABS
25.0000 mg | ORAL_TABLET | Freq: Two times a day (BID) | ORAL | Status: DC
  Administered 2024-05-03 – 2024-05-04 (×3): 25 mg via ORAL
  Filled 2024-05-02 (×3): qty 1

## 2024-05-02 MED ORDER — INSULIN GLARGINE 100 UNIT/ML ~~LOC~~ SOLN
5.0000 [IU] | Freq: Every day | SUBCUTANEOUS | Status: AC
  Administered 2024-05-02 – 2024-05-03 (×2): 5 [IU] via SUBCUTANEOUS
  Filled 2024-05-02 (×2): qty 0.05

## 2024-05-02 MED ORDER — DEXTROSE IN LACTATED RINGERS 5 % IV SOLN: INTRAVENOUS | Status: DC

## 2024-05-02 MED ORDER — SACUBITRIL-VALSARTAN 24-26 MG PO TABS
1.0000 | ORAL_TABLET | Freq: Two times a day (BID) | ORAL | Status: DC
  Filled 2024-05-02 (×2): qty 1

## 2024-05-02 MED ORDER — OLANZAPINE 5 MG PO TABS
5.0000 mg | ORAL_TABLET | Freq: Every day | ORAL | Status: DC
  Administered 2024-05-03: 5 mg via ORAL
  Filled 2024-05-02: qty 1

## 2024-05-02 MED ORDER — SODIUM CHLORIDE 0.9 % IV SOLN
100.0000 mg | Freq: Two times a day (BID) | INTRAVENOUS | Status: DC
  Administered 2024-05-02 – 2024-05-04 (×5): 100 mg via INTRAVENOUS
  Filled 2024-05-02 (×6): qty 10

## 2024-05-02 MED ORDER — MORPHINE SULFATE (PF) 2 MG/ML IV SOLN
1.0000 mg | Freq: Once | INTRAVENOUS | Status: AC
  Administered 2024-05-02: 1 mg via INTRAVENOUS
  Filled 2024-05-02: qty 1

## 2024-05-02 MED ORDER — INSULIN ASPART 100 UNIT/ML IJ SOLN: 0.0000 [IU] | Freq: Three times a day (TID) | INTRAMUSCULAR | Status: DC

## 2024-05-02 MED ORDER — INSULIN GLARGINE 100 UNITS/ML SOLOSTAR PEN: 5.0000 [IU] | PEN_INJECTOR | Freq: Every day | SUBCUTANEOUS | Status: DC

## 2024-05-02 MED ORDER — OLANZAPINE 5 MG PO TBDP: 5.0000 mg | ORAL_TABLET | Freq: Every day | ORAL | Status: DC | PRN

## 2024-05-02 NOTE — Progress Notes (Signed)
 OT Cancellation Note  Patient Details Name: ADDYLYNN BALIN MRN: [id number] DOB: [birth year]   Cancelled Treatment:    Reason Eval/Treat Not Completed: Fatigue/lethargy limiting ability to participate (Pt with lethargy and fatigue preventing participation. OT to reattempt to see pt at a later time for OT eval as appropriate/available.)  Margarie Rockey HERO., OTR/L, MA Acute Rehab [phone]   Margarie FORBES Horns 05/02/2024, 10:37 AM

## 2024-05-02 NOTE — Progress Notes (Signed)
 PT Cancellation Note  Patient Details Name: Ashley Freeman MRN: [id number] DOB: 11-25-1990   Cancelled Treatment:     Pt sleeping upon arrival, briefly opens her eyes to verbal and tactile cues. Unreliably shakes head yes/no in response to questions about mobilizing. Pt not wanting to participate at this time, will follow up later today as able or evaluate next date as appropriate.   Sabra Morel, PT, DPT  Acute Rehabilitation Services         Office: [phone]      Sabra MARLA Morel 05/02/2024, 1:33 PM

## 2024-05-02 NOTE — Progress Notes (Signed)
  [facility] Progress Note  Subjective:  Seen in room Lethargic, but awakens to voice and responds appropriately  Presentation summary: 34 y.o. female with PMH including ESRD on dialysis, T1DM, recent DKA, chronic HF, anemia, who presented to [facility] with DKA. She was recently admitted for seizure disorder, acute metabolic encephalopathy, and hyperkalemia and discharged on 04/29/24. She complained of generalized weakness. Labs were notable for CBG >600. She was given fentanyl , saline and insulin  in the ED. Nephrology was consulted for management of ESRD. Her last dialysis was here on 04/28/24. Patient seem in ED. BS still remains elevated >1000. She appears fatigued, says she is tired and doesn't feel good. Reports nausea, abdominal pain and body aches. Denies SOB, CP, palpitations, dizziness, HA, blurry vision, diarrhea, fevers, chills. She has notable facial edema which she reports is chronic. Denies history of angioedema and SVC syndrome but is very uncomfortable and unable to provide much more history. She is on entresto  outpatient.     Vitals:   05/02/24 0500 05/02/24 0519 05/02/24 0716 05/02/24 1140  BP:  (!) 160/86 (!) 150/81 (!) 160/104  Pulse:  (!) 102    Resp:  (!) 29 14 20   Temp:   98.2 F (36.8 C) 98 F (36.7 C)  TempSrc:   Oral Oral  SpO2:  100%    Weight: 59.2 kg     Height:        Exam: General: Tired appearing female, NAD Head: + periorbital and generalized facial edema Lungs: Clear bilaterally to auscultation Heart: RRR with normal S1, S2. No rubs, or gallops appreciated. Abdomen: Soft, non-distended with normoactive bowel sounds Lower extremities: trace edema b/l lower extremities Neuro: Alert and oriented X 3. Dialysis Access: RUE AVG + t/b    OP HD: TTS [facility] ([address]) [phone] 3h  B400  57.5kg  2K bath  R AVG  Hep 1600 Calcitriol  Sensipar  60mg  with HD  TUMS 1500mg  with HD     Assessment/ Plan:  # ESRD on HD  TTS - usual HD is TTS  - had HD here last night  - does not require HD today - will plan for next HD Monday if still here  # HTN  - optimize volume status with HD - a bit better today   # Anemia of CKD - Hb 9-10 here, follow, transfuse prn   # DKA - Stop fluids - Can add back a low rate of IV dextrose +water  fluids per the primary team if needed per a cautious DKA protocol.  Would defer other salt-containing IV fluids given her initial volume overload and respiratory distress   # Facial swelling, allergic reaction - added entresto  to allergy list- would not resume  # Metabolic bone disease  - resume home regimen once tolerating PO.  Here with DKA    Myer Fret MD  [facility] 05/02/2024, 1:57 PM  Recent Labs  Lab 04/28/24 0550 04/30/24 2028 05/01/24 0443 05/01/24 0905 05/02/24 0639 05/02/24 1119  HGB  --    < > 9.2*  --  12.0  --   CALCIUM  8.9   < > 8.6*   < > 8.6* 8.5*  PHOS 6.1*  --  6.2*  --   --   --   CREATININE 5.19*   < > 5.19*   < > 4.42* 4.55*  K 3.9   < > 4.0   < > 3.9 3.9   < > = values in this interval not displayed.   No results  for input(s): IRON , TIBC, FERRITIN in the last 168 hours. Inpatient medications:  carvedilol   25 mg Oral BID WC   Chlorhexidine  Gluconate Cloth  6 each Topical Q0600   Chlorhexidine  Gluconate Cloth  6 each Topical Q0600   DULoxetine   20 mg Oral Daily   [START ON 05/03/2024] famotidine   10 mg Oral Daily   heparin  injection (subcutaneous)  5,000 Units Subcutaneous Q12H   OLANZapine   5 mg Oral QHS   pantoprazole   40 mg Oral BID   potassium chloride   20 mEq Oral Once   sacubitril -valsartan   1 tablet Oral BID    anticoagulant sodium citrate      insulin  Stopped (05/02/24 1319)   lacosamide  (VIMPAT ) IV     lacosamide  (VIMPAT ) IV     acetaminophen  **OR** acetaminophen , alteplase , anticoagulant sodium citrate , dextrose , fentaNYL  (SUBLIMAZE ) injection, heparin , hydrALAZINE , labetalol , lidocaine  (PF), lidocaine -prilocaine , LORazepam ,  melatonin, OLANZapine  zydis, ondansetron  (ZOFRAN ) IV, pentafluoroprop-tetrafluoroeth

## 2024-05-02 NOTE — Progress Notes (Addendum)
 "                        PROGRESS NOTE        PATIENT DETAILS Name: Ashley Freeman Age: 34 y.o. Sex: female Date of Birth: 12/04/1990 Admit Date: 04/30/2024 Admitting Physician Eva KATHEE Pore, DO ERE:[name], [name], MD  Brief Summary: Patient is a 34 y.o.  female with a history of ESRD on HD, recurrent hospitalizations for DKA (presumed noncompliance), bipolar disorder-who presented to the ED with generalized weakness-found to have diabetic ketoacidosis and admitted to the hospitalist service.  Significant events: 1/1>> admit to [facility]  Significant studies: 1/1>> CXR: Mild pulmonary vascular congestion/interstitial edema.  Significant microbiology data: None  Procedures: None  Consults: Nephrology  Subjective: Sleeping-required gentle stimuli to awake-briefly opens eyes-moves all 4 extremities and then goes back to sleep.  Grunts yes/no but otherwise does not say much.  Received multiple doses of fentanyl  yesterday evening-Haldol  last night-and then Ativan  early this morning  Objective: Vitals: Blood pressure (!) 150/81, pulse (!) 102, temperature 98.2 F (36.8 C), temperature source Oral, resp. rate 14, height 5' 3 (1.6 m), weight 59.2 kg, SpO2 100%.   Exam: Gen Exam: Not in any distress HEENT:atraumatic, normocephalic Chest: B/L clear to auscultation anteriorly CVS:S1S2 regular Abdomen:soft non tender, non distended Extremities:no edema Neurology: Non focal-but difficult exam  Skin: no rash  Pertinent Labs/Radiology:    Latest Ref Rng & Units 05/02/2024    6:39 AM 05/01/2024    4:43 AM 04/30/2024    9:35 PM  CBC  WBC 4.0 - 10.5 K/uL 14.5  7.7    Hemoglobin 12.0 - 15.0 g/dL 87.9  9.2  88.0   Hematocrit 36.0 - 46.0 % 35.2  31.7  35.0   Platelets 150 - 400 K/uL 247  282      Lab Results  Component Value Date   NA 128 (L) 05/02/2024   K 3.6 05/02/2024   CL 94 (L) 05/02/2024   CO2 19 (L) 05/02/2024       Assessment/Plan: DKA Resolved Remains on insulin  infusion-once a bit more awake-oral intake stable-Will transition to SQ insulin .  DM-1 (A1c 8.5 on 12/1) See above  ?  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 Suspect this is mostly a chronic issue.  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) Volume removal with HD  ESRD on HD Nephrology following and directing care  Normocytic anemia Secondary to ESRD Defer Aranesp /iron  therapies to nephrology service  Acute toxic/metabolic encephalopathy Required numerous doses of fentanyl  yesterday evening-required Haldol  for HD-and then Ativan  earlier this morning Drowsy but slowly coming around this morning-opens eyes-moves all 4 extremities in response to pain. Continue supportive care.  Seizure disorder Resume Vimpat  Reviewed most recent neurology note-no longer on Keppra /Lamictal  due to compliance issues-and switch to Vimpat .  History of bipolar disorder Appears to be on Zyprexa  and Cymbalta -stop Seroquel .  History of medication noncompliance Recently hospitalized and signed out AMA  Code status:   Code Status: Full Code   DVT Prophylaxis: heparin  injection 5,000 Units Start: 05/01/24 2200 SCDs Start: 04/30/24 2355    Family Communication: None at bedside   Disposition Plan: Status is: Inpatient Remains inpatient appropriate because: Severity of illness   Planned Discharge Destination:Home   Diet: Diet Order             Diet renal/carb modified with fluid restriction Diet-HS  Snack? Nothing; Fluid restriction: 2000 mL Fluid; Room service appropriate? Yes with Assist; Fluid consistency: Thin  Diet effective now                     Antimicrobial agents: Anti-infectives (From admission, onward)    None        MEDICATIONS: Scheduled Meds:  Chlorhexidine  Gluconate Cloth  6 each Topical  Q0600   Chlorhexidine  Gluconate Cloth  6 each Topical Q0600   famotidine   20 mg Oral Daily   heparin  injection (subcutaneous)  5,000 Units Subcutaneous Q12H   potassium chloride   20 mEq Oral Once   QUEtiapine   25 mg Oral QHS   Continuous Infusions:  anticoagulant sodium citrate      insulin  1.5 Units/hr (05/02/24 0820)   PRN Meds:.acetaminophen  **OR** acetaminophen , alteplase , anticoagulant sodium citrate , dextrose , fentaNYL  (SUBLIMAZE ) injection, heparin , lidocaine  (PF), lidocaine -prilocaine , LORazepam , melatonin, ondansetron  (ZOFRAN ) IV, pentafluoroprop-tetrafluoroeth   I have personally reviewed following labs and imaging studies  LABORATORY DATA: CBC: Recent Labs  Lab 04/30/24 2028 04/30/24 2134 04/30/24 2135 05/01/24 0443 05/02/24 0639  WBC 7.5  --   --  7.7 14.5*  NEUTROABS 3.8  --   --  3.7  --   HGB 9.5* 11.9* 11.9* 9.2* 12.0  HCT 35.8* 35.0* 35.0* 31.7* 35.2*  MCV 100.0  --   --  91.6 76.9*  PLT 284  --   --  282 247    Basic Metabolic Panel: Recent Labs  Lab 04/26/24 0429 04/26/24 1744 04/27/24 0444 04/28/24 0550 04/30/24 2128 05/01/24 0443 05/01/24 0905 05/01/24 1223 05/01/24 1943 05/02/24 0220 05/02/24 0435  NA 132*   < > 134* 133*   < > 120* 121* 125* 129* 128* 128*  K 3.7   < > 3.5 3.9   < > 4.0 3.6 3.7 3.1* 3.7 3.6  CL 94*   < > 95* 95*   < > 83* 84* 87* 93* 93* 94*  CO2 26   < > 24 19*   < > 11* 13* 15* 18* 20* 19*  GLUCOSE 129*   < > 129* 173*   < > >1,200* 1,131* 750* 183* 166* 119*  BUN 28*   < > 31* 35*   < > 40* 42* 42* 39* 30* 30*  CREATININE 3.50*   < > 4.27* 5.19*   < > 5.19* 5.37* 5.59* 5.01* 4.37* 4.36*  CALCIUM  7.9*   < > 8.6* 8.9   < > 8.6* 8.7* 8.9 8.3* 8.4* 8.3*  MG 2.0  --  2.1 2.3  --  2.1  --   --  1.8 1.9  --   PHOS 4.0  --  5.2* 6.1*  --  6.2*  --   --   --   --   --    < > = values in this interval not displayed.    GFR: Estimated Creatinine Clearance: 15.5 mL/min (A) (by C-G formula based on SCr of 4.36 mg/dL  (H)).  Liver Function Tests: No results for input(s): AST, ALT, ALKPHOS, BILITOT, PROT, ALBUMIN  in the last 168 hours. No results for input(s): LIPASE, AMYLASE in the last 168 hours. No results for input(s): AMMONIA in the last 168 hours.  Coagulation Profile: No results for input(s): INR, PROTIME in the last 168 hours.  Cardiac Enzymes: No results for input(s): CKTOTAL, CKMB, CKMBINDEX, TROPONINI in the last 168 hours.  BNP (last 3 results) Recent Labs    05/01/24 0443  PROBNP >35,000.0*    Lipid  Profile: No results for input(s): CHOL, HDL, LDLCALC, TRIG, CHOLHDL, LDLDIRECT in the last 72 hours.  Thyroid  Function Tests: No results for input(s): TSH, T4TOTAL, FREET4, T3FREE, THYROIDAB in the last 72 hours.  Anemia Panel: No results for input(s): VITAMINB12, FOLATE, FERRITIN, TIBC, IRON , RETICCTPCT in the last 72 hours.  Urine analysis:    Component Value Date/Time   COLORURINE YELLOW 04/13/2024 1749   APPEARANCEUR HAZY (A) 04/13/2024 1749   APPEARANCEUR Hazy 11/10/2013 2043   LABSPEC 1.018 04/13/2024 1749   LABSPEC 1.031 11/10/2013 2043   PHURINE 7.0 04/13/2024 1749   GLUCOSEU >=500 (A) 04/13/2024 1749   GLUCOSEU >=500 11/10/2013 2043   HGBUR NEGATIVE 04/13/2024 1749   BILIRUBINUR NEGATIVE 04/13/2024 1749   BILIRUBINUR negative 06/04/2018 1035   BILIRUBINUR Negative 11/24/2015 1443   BILIRUBINUR Negative 11/10/2013 2043   KETONESUR 5 (A) 04/13/2024 1749   PROTEINUR >=300 (A) 04/13/2024 1749   UROBILINOGEN 0.2 09/14/2019 1732   NITRITE NEGATIVE 04/13/2024 1749   LEUKOCYTESUR NEGATIVE 04/13/2024 1749   LEUKOCYTESUR 1+ 11/10/2013 2043    Sepsis Labs: Lactic Acid, Venous    Component Value Date/Time   LATICACIDVEN 2.2 (HH) 04/24/2024 1216    MICROBIOLOGY: Recent Results (from the past 240 hours)  Resp panel by RT-PCR (RSV, Flu A&B, Covid) Anterior Nasal Swab     Status: None   Collection Time:  04/23/24  9:21 AM   Specimen: Anterior Nasal Swab  Result Value Ref Range Status   SARS Coronavirus 2 by RT PCR NEGATIVE NEGATIVE Final   Influenza A by PCR NEGATIVE NEGATIVE Final   Influenza B by PCR NEGATIVE NEGATIVE Final    Comment: (NOTE) The Xpert Xpress SARS-CoV-2/FLU/RSV plus assay is intended as an aid in the diagnosis of influenza from Nasopharyngeal swab specimens and should not be used as a sole basis for treatment. Nasal washings and aspirates are unacceptable for Xpert Xpress SARS-CoV-2/FLU/RSV testing.  Fact Sheet for Patients: bloggercourse.com  Fact Sheet for Healthcare Providers: seriousbroker.it  This test is not yet approved or cleared by the [place]  FDA and has been authorized for detection and/or diagnosis of SARS-CoV-2 by FDA under an Emergency Use Authorization (EUA). This EUA will remain in effect (meaning this test can be used) for the duration of the COVID-19 declaration under Section 564(b)(1) of the Act, 21 U.S.C. section 360bbb-3(b)(1), unless the authorization is terminated or revoked.     Resp Syncytial Virus by PCR NEGATIVE NEGATIVE Final    Comment: (NOTE) Fact Sheet for Patients: bloggercourse.com  Fact Sheet for Healthcare Providers: seriousbroker.it  This test is not yet approved or cleared by the [place]  FDA and has been authorized for detection and/or diagnosis of SARS-CoV-2 by FDA under an Emergency Use Authorization (EUA). This EUA will remain in effect (meaning this test can be used) for the duration of the COVID-19 declaration under Section 564(b)(1) of the Act, 21 U.S.C. section 360bbb-3(b)(1), unless the authorization is terminated or revoked.  Performed at [facility], [address] [place], [place] [address]   MRSA Next Gen by PCR, Nasal     Status: None   Collection Time: 04/23/24  3:47 PM   Specimen:  Nasal Mucosa; Nasal Swab  Result Value Ref Range Status   MRSA by PCR Next Gen NOT DETECTED NOT DETECTED Final    Comment: (NOTE) The GeneXpert MRSA Assay (FDA approved for NASAL specimens only), is one component of a comprehensive MRSA colonization surveillance program. It is not intended to diagnose MRSA infection nor  to guide or monitor treatment for MRSA infections. Test performance is not FDA approved in patients less than 75 years old. Performed at [facility], [address] [place], [place] [address]   Culture, Respiratory w Gram Stain     Status: None   Collection Time: 04/24/24  3:29 AM   Specimen: Tracheal Aspirate; Respiratory  Result Value Ref Range Status   Specimen Description TRACHEAL ASPIRATE  Final   Special Requests NONE  Final   Gram Stain   Final    RARE WBC PRESENT, PREDOMINANTLY PMN NO ORGANISMS SEEN Performed at [facility], [address], [place], [place] [address]    Culture RARE CANDIDA ALBICANS  Final   Report Status 04/26/2024 FINAL  Final  Culture, blood (Routine X 2) w Reflex to ID Panel     Status: None   Collection Time: 04/24/24 12:16 PM   Specimen: BLOOD LEFT HAND  Result Value Ref Range Status   Specimen Description BLOOD LEFT HAND  Final   Special Requests   Final    BOTTLES DRAWN AEROBIC ONLY Blood Culture results may not be optimal due to an inadequate volume of blood received in culture bottles   Culture   Final    NO GROWTH 5 DAYS Performed at [facility], [address] [place], [place] [address]    Report Status 04/29/2024 FINAL  Final  Culture, blood (Routine X 2) w Reflex to ID Panel     Status: None   Collection Time: 04/24/24 12:17 PM   Specimen: BLOOD LEFT HAND  Result Value Ref Range Status   Specimen Description BLOOD LEFT HAND  Final   Special Requests   Final    BOTTLES DRAWN AEROBIC ONLY Blood Culture results may not be optimal due to an inadequate volume of blood received in culture bottles    Culture   Final    NO GROWTH 5 DAYS Performed at [facility], [address], [place], [place] [address]    Report Status 04/29/2024 FINAL  Final    RADIOLOGY STUDIES/RESULTS: DG Chest Port 1 View Result Date: 04/30/2024 EXAM: 1 VIEW(S) XRAY OF THE CHEST 04/30/2024 08:32:51 PM COMPARISON: 04/24/2024 CLINICAL HISTORY: SOB FINDINGS: LUNGS AND PLEURA: Low lung volumes. Perihilar vascular fullness with bilateral interstitial prominence. Decreased retrocardiac opacity. Trace right pleural effusion. No focal pulmonary opacity. No pneumothorax. HEART AND MEDIASTINUM: Unchanged cardiomegaly. BONES AND SOFT TISSUES: No acute osseous abnormality. IMPRESSION: 1. Mild pulmonary vascular congestion and interstitial edema. 2. Trace right pleural effusion. 3. Unchanged cardiomegaly. Electronically signed by: Greig Pique MD 04/30/2024 08:38 PM EST RP Workstation: [id number]     LOS: 1 day   Donalda Applebaum, MD  [facility]    To contact the attending provider between 7A-7P or the covering provider during after hours 7P-7A, please log into the web site [email or web address] and access using universal [place] password for that web site. If you do not have the password, please call the hospital operator.  05/02/2024, 9:17 AM    "

## 2024-05-02 NOTE — Progress Notes (Signed)
 Assumed care 1945. Patient asking for pain medication. RN was able to have patient eat fruit cup, some rice and broccoli, and a fourth of mac and cheese lean cuisine for dinner. She declined anymore stating the food is gross. Education provided on po intake. Patient also agreeable to take all po medications for RN. MD contacted and orders placed. Patient calm and cooperative.

## 2024-05-03 DIAGNOSIS — N186 End stage renal disease: Secondary | ICD-10-CM | POA: Diagnosis not present

## 2024-05-03 DIAGNOSIS — R531 Weakness: Secondary | ICD-10-CM | POA: Diagnosis not present

## 2024-05-03 DIAGNOSIS — E1010 Type 1 diabetes mellitus with ketoacidosis without coma: Secondary | ICD-10-CM | POA: Diagnosis not present

## 2024-05-03 DIAGNOSIS — F319 Bipolar disorder, unspecified: Secondary | ICD-10-CM | POA: Diagnosis not present

## 2024-05-03 LAB — CBC
HCT: 28.9 % — ABNORMAL LOW (ref 36.0–46.0)
Hemoglobin: 9.7 g/dL — ABNORMAL LOW (ref 12.0–15.0)
MCH: 26.3 pg (ref 26.0–34.0)
MCHC: 33.6 g/dL (ref 30.0–36.0)
MCV: 78.3 fL — ABNORMAL LOW (ref 80.0–100.0)
Platelets: 205 K/uL (ref 150–400)
RBC: 3.69 MIL/uL — ABNORMAL LOW (ref 3.87–5.11)
RDW: 13.7 % (ref 11.5–15.5)
WBC: 11.2 K/uL — ABNORMAL HIGH (ref 4.0–10.5)
nRBC: 0.3 % — ABNORMAL HIGH (ref 0.0–0.2)

## 2024-05-03 LAB — RENAL FUNCTION PANEL
Albumin: 2.7 g/dL — ABNORMAL LOW (ref 3.5–5.0)
Anion gap: 14 (ref 5–15)
BUN: 34 mg/dL — ABNORMAL HIGH (ref 6–20)
CO2: 23 mmol/L (ref 22–32)
Calcium: 8.3 mg/dL — ABNORMAL LOW (ref 8.9–10.3)
Chloride: 96 mmol/L — ABNORMAL LOW (ref 98–111)
Creatinine, Ser: 5.14 mg/dL — ABNORMAL HIGH (ref 0.44–1.00)
GFR, Estimated: 11 mL/min — ABNORMAL LOW
Glucose, Bld: 54 mg/dL — ABNORMAL LOW (ref 70–99)
Phosphorus: 4.9 mg/dL — ABNORMAL HIGH (ref 2.5–4.6)
Potassium: 3.6 mmol/L (ref 3.5–5.1)
Sodium: 132 mmol/L — ABNORMAL LOW (ref 135–145)

## 2024-05-03 LAB — GLUCOSE, CAPILLARY
Glucose-Capillary: 48 mg/dL — ABNORMAL LOW (ref 70–99)
Glucose-Capillary: 54 mg/dL — ABNORMAL LOW (ref 70–99)
Glucose-Capillary: 86 mg/dL (ref 70–99)
Glucose-Capillary: 62 mg/dL — ABNORMAL LOW (ref 70–99)
Glucose-Capillary: 135 mg/dL — ABNORMAL HIGH (ref 70–99)
Glucose-Capillary: 272 mg/dL — ABNORMAL HIGH (ref 70–99)
Glucose-Capillary: 356 mg/dL — ABNORMAL HIGH (ref 70–99)

## 2024-05-03 MED ORDER — LIDOCAINE HCL (PF) 1 % IJ SOLN: 5.0000 mL | INTRAMUSCULAR | Status: DC | PRN

## 2024-05-03 MED ORDER — INSULIN GLARGINE-YFGN 100 UNIT/ML ~~LOC~~ SOLN
5.0000 [IU] | Freq: Every day | SUBCUTANEOUS | Status: DC
  Filled 2024-05-03: qty 0.05

## 2024-05-03 MED ORDER — ANTICOAGULANT SODIUM CITRATE 4% (200MG/5ML) IV SOLN: 5.0000 mL | Status: DC | PRN

## 2024-05-03 MED ORDER — PENTAFLUOROPROP-TETRAFLUOROETH EX AERO: 1.0000 | INHALATION_SPRAY | CUTANEOUS | Status: DC | PRN

## 2024-05-03 MED ORDER — INSULIN ASPART 100 UNIT/ML IJ SOLN: 0.0000 [IU] | Freq: Every day | INTRAMUSCULAR | Status: DC

## 2024-05-03 MED ORDER — HEPARIN SODIUM (PORCINE) 1000 UNIT/ML DIALYSIS: 1000.0000 [IU] | INTRAMUSCULAR | Status: DC | PRN

## 2024-05-03 MED ORDER — MORPHINE SULFATE (PF) 2 MG/ML IV SOLN
1.0000 mg | Freq: Once | INTRAVENOUS | Status: AC
  Administered 2024-05-03: 1 mg via INTRAVENOUS
  Filled 2024-05-03: qty 1

## 2024-05-03 MED ORDER — INSULIN ASPART 100 UNIT/ML IJ SOLN
0.0000 [IU] | Freq: Three times a day (TID) | INTRAMUSCULAR | Status: DC
  Administered 2024-05-03: 3 [IU] via SUBCUTANEOUS
  Administered 2024-05-04: 2 [IU] via SUBCUTANEOUS
  Filled 2024-05-03: qty 6

## 2024-05-03 MED ORDER — LIDOCAINE-PRILOCAINE 2.5-2.5 % EX CREA: 1.0000 | TOPICAL_CREAM | CUTANEOUS | Status: DC | PRN

## 2024-05-03 MED ORDER — SODIUM CHLORIDE 0.9 % IV SOLN
50.0000 mg | INTRAVENOUS | Status: DC
  Filled 2024-05-03: qty 5

## 2024-05-03 NOTE — Plan of Care (Signed)
 IV pain medication seeking behavior.  Specifically morphine . Declining to take Tylenol  and melatonin continue to request for narcotics. Patient received morphine  1 mg total 2 doses as of now. If patient continues to ask more narcotics she will not receive any more.   Traycen Goyer, MD [facility] 05/03/2024, 12:40 AM

## 2024-05-03 NOTE — Plan of Care (Signed)
 IV pain medication seeking behavior observing for last 3 days. Will try to limit narcotics unless it is absolutely necessary.  Rook Maue, MD [facility] 05/03/2024, 9:16 PM

## 2024-05-03 NOTE — Progress Notes (Signed)
 OT Cancellation Note  Patient Details Name: Ashley Freeman MRN: [id number] DOB: 1991-04-02   Cancelled Treatment:    Reason Eval/Treat Not Completed: Other (comment) (Attempted to see pt this AM. Refused, eating breakfast. Per notes, this is third attempt to see pt, per department protocol, OT to sign-off after 3 attempts. Also, pt reports she has been up OOB and feels she is at her baseline. Will sign-off.)   Iosefa Weintraub M. [place], OTR/L [facility] Acute Rehabilitation Services [phone] Secure Chat Preferred  Rikki Burma 05/03/2024, 9:06 AM

## 2024-05-03 NOTE — Progress Notes (Signed)
 Pt called out asking for her sugar to be checked. BG 48. Gave patient juice and crackers, which patient consumed. MD notified.

## 2024-05-03 NOTE — Progress Notes (Signed)
 [facility] Progress Note  Subjective:  Seen in HD unit Much more alert and interactive today Off of IV insulin   Presentation summary: 34 y.o. female with PMH including ESRD on dialysis, T1DM, recent DKA, chronic HF, anemia, who presented to [facility] with DKA. She was recently admitted for seizure disorder, acute metabolic encephalopathy, and hyperkalemia and discharged on 04/29/24. She complained of generalized weakness. Labs were notable for CBG >600. She was given fentanyl , saline and insulin  in the ED. Nephrology was consulted for management of ESRD. Her last dialysis was here on 04/28/24. Patient seem in ED. BS still remains elevated >1000. She appears fatigued, says she is tired and doesn't feel good. Reports nausea, abdominal pain and body aches. Denies SOB, CP, palpitations, dizziness, HA, blurry vision, diarrhea, fevers, chills. She has notable facial edema which she reports is chronic. Denies history of angioedema and SVC syndrome but is very uncomfortable and unable to provide much more history. She is on entresto  outpatient.     Vitals:   05/03/24 1153 05/03/24 1208 05/03/24 1226 05/03/24 1342  BP: (!) 126/50 122/61 90/78 (!) 120/58  Pulse: 88 87 93 88  Resp: (!) 24 (!) 38 12   Temp:      TempSrc:      SpO2: 98% 100% 100% 99%  Weight:      Height:        Exam: General; alert, no distress Mild periorbital edema Lungs: Clear bilaterally to auscultation Heart: RRR with normal S1, S2 Abdomen: Soft, non-distended with normoactive bowel sounds Lower extremities: trace edema b/l lower extremities Neuro: Alert and oriented X 3. Dialysis Access: RUE AVG + t/b    OP HD: TTS [facility]  ([address]) [phone] 3h  B400  57.5kg  2K bath  R AVG  Hep 1600 Calcitriol  Sensipar  60mg  with HD  TUMS 1500mg  with HD     Assessment/ Plan:  # ESRD on HD TTS - usual HD is TTS  - had HD here Friday night w/ 1.9 L off - on HD today upstairs, rollover  from Sat, UF pending - next HD 1/06   # HTN/volume  - bp's stable - 1-2 kg over pre HD today - better today, on RA w/ 100% sats   # Anemia of CKD - Hb 9-10 here, follow, transfuse prn   # DKA - BS's are much better - off IV insulin , on SQ insulin  now  # Facial swelling; possible allergic reaction - facial swelling is mild, back to baseline - avoid entresto  (added to allergy list- would not resume)   # Metabolic bone disease  - resume home regimen once tolerating PO   Rob Ryder Man MD  [facility] 05/03/2024, 1:48 PM  Recent Labs  Lab 05/01/24 0443 05/01/24 0905 05/02/24 0639 05/02/24 1119 05/02/24 2114 05/03/24 0521  HGB 9.2*  --  12.0  --   --  9.7*  ALBUMIN   --   --   --   --   --  2.7*  CALCIUM  8.6*   < > 8.6*   < > 8.3* 8.3*  PHOS 6.2*  --   --   --   --  4.9*  CREATININE 5.19*   < > 4.42*   < > 4.85* 5.14*  K 4.0   < > 3.9   < > 3.3* 3.6   < > = values in this interval not displayed.   No results for input(s): IRON , TIBC, FERRITIN in the last 168 hours. Inpatient medications:  carvedilol   25 mg Oral BID WC   Chlorhexidine  Gluconate Cloth  6 each Topical Q0600   Chlorhexidine  Gluconate Cloth  6 each Topical Q0600   DULoxetine   20 mg Oral Daily   famotidine   10 mg Oral Daily   heparin  injection (subcutaneous)  5,000 Units Subcutaneous Q12H   insulin  aspart  0-6 Units Subcutaneous TID WC   insulin  glargine  5 Units Subcutaneous Q2200   OLANZapine   5 mg Oral QHS   pantoprazole   40 mg Oral BID   potassium chloride   20 mEq Oral Once    anticoagulant sodium citrate      lacosamide  (VIMPAT ) IV 100 mg (05/03/24 0945)   [START ON 05/04/2024] lacosamide  (VIMPAT ) IV     acetaminophen  **OR** acetaminophen , alteplase , anticoagulant sodium citrate , dextrose , heparin , hydrALAZINE , labetalol , lidocaine  (PF), lidocaine -prilocaine , melatonin, OLANZapine  zydis, ondansetron  (ZOFRAN ) IV, pentafluoroprop-tetrafluoroeth

## 2024-05-03 NOTE — Progress Notes (Signed)
 PT Cancellation Note  Patient Details Name: [name] MRN: [id number] DOB: 13-Aug-1990   Cancelled Treatment:    Reason Eval/Treat Not Completed: Patient declined, no reason specified (PT consult appreciated and chart reviewed. Pt declined PT evaluation as she was still working on her breakfast. Per notes, this is third attempt to see pt. Per department protocol, PT to sign-off after 3 attempts. Of note, pt reports she has been up OOB and feels she is at her baseline. Will sign-off. Please re-consult if there is a change in status or pt agreeable to participate in therapy.)  Randall SAUNDERS, PT, DPT Acute Rehabilitation Services Office: [phone] Secure Chat Preferred  Delon CHRISTELLA Callander 05/03/2024, 9:34 AM

## 2024-05-03 NOTE — Progress Notes (Addendum)
 "                        PROGRESS NOTE        PATIENT DETAILS Name: Ashley Freeman Age: 34 y.o. Sex: female Date of Birth: April 19, 1991 Admit Date: 04/30/2024 Admitting Physician Eva KATHEE Pore, DO ERE:[name], [name], MD  Brief Summary: Patient is a 34 y.o.  female with a history of ESRD on HD, recurrent hospitalizations for DKA (presumed noncompliance), bipolar disorder-who presented to the ED with generalized weakness-found to have diabetic ketoacidosis and admitted to the hospitalist service.  Significant events: 1/1>> admit to [facility]  Significant studies: 1/1>> CXR: Mild pulmonary vascular congestion/interstitial edema.  Significant microbiology data: None  Procedures: None  Consults: Nephrology  Subjective: Finally woke up yesterday evening-sleeping when I walked in this morning-but easily aroused-did not want to be disturbed-speech is clear-much more awake and alert compared to yesterday.  Objective: Vitals: Blood pressure 110/80, pulse 94, temperature 97.8 F (36.6 C), temperature source Oral, resp. rate 18, height 5' 3 (1.6 m), weight 59.2 kg, SpO2 100%.   Exam: Not in any distress Chest: Clear to auscultation CVS: S1-S2 regular Abdomen: Soft nontender nondistended Extremities: No edema Nonfocal exam but limited given poor cooperation  Pertinent Labs/Radiology:    Latest Ref Rng & Units 05/03/2024    5:21 AM 05/02/2024    6:39 AM 05/01/2024    4:43 AM  CBC  WBC 4.0 - 10.5 K/uL 11.2  14.5  7.7   Hemoglobin 12.0 - 15.0 g/dL 9.7  87.9  9.2   Hematocrit 36.0 - 46.0 % 28.9  35.2  31.7   Platelets 150 - 400 K/uL 205  247  282     Lab Results  Component Value Date   NA 132 (L) 05/03/2024   K 3.6 05/03/2024   CL 96 (L) 05/03/2024   CO2 23 05/03/2024      Assessment/Plan: DKA Resolved with IV insulin /IVF-has been transition to SQ insulin  overnight. Likely secondary to noncompliance.  DM-1 (A1c 8.5 on 12/1) Hypoglycemic this  morning Received Lantus  5 units overnight On extra sensitive SSI Watch closely-supportive care Encourage oral intake.  Recent Labs    05/03/24 0555 05/03/24 0609 05/03/24 0812  GLUCAP 54* 86 62*     ?  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 Discussed with nephrology-avoid ACEI/ARB-hence no longer on Entresto .  Addendum Chart reviewed with nephrology-patient had vascular procedure done in May 2025-central veins were patent-likely facial swelling as secondary to volume issues due to noncompliance with HD.  Will reassess over the next several days after more HD sessions.  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) Volume removal with HD Continue beta-blocker No longer interested due to possibility of angioedema-if needed-Will start on hydralazine /Imdur   ESRD on HD Nephrology following and directing care  Normocytic anemia Secondary to ESRD Defer Aranesp /iron  therapies to nephrology service  Acute toxic/metabolic encephalopathy Secondary to numerous doses of fentanyl /Haldol /Ativan  that the patient received on day of admission Now much more awake/alert-essentially back to baseline  Seizure disorder Continue Vimpat  Reviewed most recent neurology note-no longer on Keppra /Lamictal  due to compliance issues-and switch to Vimpat .  History of bipolar disorder Appears to be on Zyprexa  and Cymbalta  No longer on Seroquel .  History of medication noncompliance Recently hospitalized and signed out AMA  Code status:   Code Status: Full Code   DVT Prophylaxis: heparin   injection 5,000 Units Start: 05/01/24 2200 SCDs Start: 04/30/24 2355    Family Communication: None at bedside   Disposition Plan: Status is: Inpatient Remains inpatient appropriate because: Severity of illness   Planned Discharge Destination:Home   Diet: Diet Order             Diet  renal/carb modified with fluid restriction Diet-HS Snack? Nothing; Fluid restriction: 2000 mL Fluid; Room service appropriate? Yes with Assist; Fluid consistency: Thin  Diet effective now                     Antimicrobial agents: Anti-infectives (From admission, onward)    None        MEDICATIONS: Scheduled Meds:  carvedilol   25 mg Oral BID WC   Chlorhexidine  Gluconate Cloth  6 each Topical Q0600   Chlorhexidine  Gluconate Cloth  6 each Topical Q0600   DULoxetine   20 mg Oral Daily   famotidine   10 mg Oral Daily   heparin  injection (subcutaneous)  5,000 Units Subcutaneous Q12H   insulin  aspart  0-5 Units Subcutaneous QHS   insulin  aspart  0-6 Units Subcutaneous TID WC   insulin  glargine  5 Units Subcutaneous Q2200   OLANZapine   5 mg Oral QHS   pantoprazole   40 mg Oral BID   potassium chloride   20 mEq Oral Once   Continuous Infusions:  anticoagulant sodium citrate      lacosamide  (VIMPAT ) IV 100 mg (05/03/24 0945)   lacosamide  (VIMPAT ) IV     PRN Meds:.acetaminophen  **OR** acetaminophen , alteplase , anticoagulant sodium citrate , dextrose , heparin , hydrALAZINE , labetalol , lidocaine  (PF), lidocaine -prilocaine , melatonin, OLANZapine  zydis, ondansetron  (ZOFRAN ) IV, pentafluoroprop-tetrafluoroeth   I have personally reviewed following labs and imaging studies  LABORATORY DATA: CBC: Recent Labs  Lab 04/30/24 2028 04/30/24 2134 04/30/24 2135 05/01/24 0443 05/02/24 0639 05/03/24 0521  WBC 7.5  --   --  7.7 14.5* 11.2*  NEUTROABS 3.8  --   --  3.7  --   --   HGB 9.5* 11.9* 11.9* 9.2* 12.0 9.7*  HCT 35.8* 35.0* 35.0* 31.7* 35.2* 28.9*  MCV 100.0  --   --  91.6 76.9* 78.3*  PLT 284  --   --  282 247 205    Basic Metabolic Panel: Recent Labs  Lab 04/27/24 0444 04/28/24 0550 04/30/24 2128 05/01/24 0443 05/01/24 0905 05/01/24 1943 05/02/24 0220 05/02/24 0435 05/02/24 0639 05/02/24 1119 05/02/24 1605 05/02/24 2114 05/03/24 0521  NA 134* 133*   < > 120*   < >  129* 128*   < > 128* 128* 127* 130* 132*  K 3.5 3.9   < > 4.0   < > 3.1* 3.7   < > 3.9 3.9 3.7 3.3* 3.6  CL 95* 95*   < > 83*   < > 93* 93*   < > 93* 93* 94* 93* 96*  CO2 24 19*   < > 11*   < > 18* 20*   < > 21* 20* 19* 22 23  GLUCOSE 129* 173*   < > >1,200*   < > 183* 166*   < > 172* 181* 111* 235* 54*  BUN 31* 35*   < > 40*   < > 39* 30*   < > 30* 31* 32* 34* 34*  CREATININE 4.27* 5.19*   < > 5.19*   < > 5.01* 4.37*   < > 4.42* 4.55* 4.66* 4.85* 5.14*  CALCIUM  8.6* 8.9   < > 8.6*   < > 8.3* 8.4*   < >  8.6* 8.5* 8.2* 8.3* 8.3*  MG 2.1 2.3  --  2.1  --  1.8 1.9  --   --   --   --   --   --   PHOS 5.2* 6.1*  --  6.2*  --   --   --   --   --   --   --   --  4.9*   < > = values in this interval not displayed.    GFR: Estimated Creatinine Clearance: 13.1 mL/min (A) (by C-G formula based on SCr of 5.14 mg/dL (H)).  Liver Function Tests: Recent Labs  Lab 05/03/24 0521  ALBUMIN  2.7*   No results for input(s): LIPASE, AMYLASE in the last 168 hours. No results for input(s): AMMONIA in the last 168 hours.  Coagulation Profile: No results for input(s): INR, PROTIME in the last 168 hours.  Cardiac Enzymes: No results for input(s): CKTOTAL, CKMB, CKMBINDEX, TROPONINI in the last 168 hours.  BNP (last 3 results) Recent Labs    05/01/24 0443  PROBNP >35,000.0*    Lipid Profile: No results for input(s): CHOL, HDL, LDLCALC, TRIG, CHOLHDL, LDLDIRECT in the last 72 hours.  Thyroid  Function Tests: No results for input(s): TSH, T4TOTAL, FREET4, T3FREE, THYROIDAB in the last 72 hours.  Anemia Panel: No results for input(s): VITAMINB12, FOLATE, FERRITIN, TIBC, IRON , RETICCTPCT in the last 72 hours.  Urine analysis:    Component Value Date/Time   COLORURINE YELLOW 04/13/2024 1749   APPEARANCEUR HAZY (A) 04/13/2024 1749   APPEARANCEUR Hazy 11/10/2013 2043   LABSPEC 1.018 04/13/2024 1749   LABSPEC 1.031 11/10/2013 2043   PHURINE 7.0  04/13/2024 1749   GLUCOSEU >=500 (A) 04/13/2024 1749   GLUCOSEU >=500 11/10/2013 2043   HGBUR NEGATIVE 04/13/2024 1749   BILIRUBINUR NEGATIVE 04/13/2024 1749   BILIRUBINUR negative 06/04/2018 1035   BILIRUBINUR Negative 11/24/2015 1443   BILIRUBINUR Negative 11/10/2013 2043   KETONESUR 5 (A) 04/13/2024 1749   PROTEINUR >=300 (A) 04/13/2024 1749   UROBILINOGEN 0.2 09/14/2019 1732   NITRITE NEGATIVE 04/13/2024 1749   LEUKOCYTESUR NEGATIVE 04/13/2024 1749   LEUKOCYTESUR 1+ 11/10/2013 2043    Sepsis Labs: Lactic Acid, Venous    Component Value Date/Time   LATICACIDVEN 2.2 (HH) 04/24/2024 1216    MICROBIOLOGY: Recent Results (from the past 240 hours)  MRSA Next Gen by PCR, Nasal     Status: None   Collection Time: 04/23/24  3:47 PM   Specimen: Nasal Mucosa; Nasal Swab  Result Value Ref Range Status   MRSA by PCR Next Gen NOT DETECTED NOT DETECTED Final    Comment: (NOTE) The GeneXpert MRSA Assay (FDA approved for NASAL specimens only), is one component of a comprehensive MRSA colonization surveillance program. It is not intended to diagnose MRSA infection nor to guide or monitor treatment for MRSA infections. Test performance is not FDA approved in patients less than 9 years old. Performed at [facility], [address], [place], [place] [address]   Culture, Respiratory w Gram Stain     Status: None   Collection Time: 04/24/24  3:29 AM   Specimen: Tracheal Aspirate; Respiratory  Result Value Ref Range Status   Specimen Description TRACHEAL ASPIRATE  Final   Special Requests NONE  Final   Gram Stain   Final    RARE WBC PRESENT, PREDOMINANTLY PMN NO ORGANISMS SEEN Performed at [facility], [address] [place], [place] [address]    Culture RARE CANDIDA ALBICANS  Final  Report Status 04/26/2024 FINAL  Final  Culture, blood (Routine X 2) w Reflex to ID Panel     Status: None   Collection Time: 04/24/24 12:16 PM   Specimen: BLOOD LEFT HAND  Result Value  Ref Range Status   Specimen Description BLOOD LEFT HAND  Final   Special Requests   Final    BOTTLES DRAWN AEROBIC ONLY Blood Culture results may not be optimal due to an inadequate volume of blood received in culture bottles   Culture   Final    NO GROWTH 5 DAYS Performed at [facility], [address], [place], [place] [address]    Report Status 04/29/2024 FINAL  Final  Culture, blood (Routine X 2) w Reflex to ID Panel     Status: None   Collection Time: 04/24/24 12:17 PM   Specimen: BLOOD LEFT HAND  Result Value Ref Range Status   Specimen Description BLOOD LEFT HAND  Final   Special Requests   Final    BOTTLES DRAWN AEROBIC ONLY Blood Culture results may not be optimal due to an inadequate volume of blood received in culture bottles   Culture   Final    NO GROWTH 5 DAYS Performed at [facility], [address], [place], [place] [address]    Report Status 04/29/2024 FINAL  Final    RADIOLOGY STUDIES/RESULTS: No results found.    LOS: 2 days   Donalda Applebaum, MD  [facility]    To contact the attending provider between 7A-7P or the covering provider during after hours 7P-7A, please log into the web site [email or web address] and access using universal [place] password for that web site. If you do not have the password, please call the hospital operator.  05/03/2024, 10:06 AM    "

## 2024-05-03 NOTE — Plan of Care (Signed)
" °  Problem: Coping: Goal: Ability to adjust to condition or change in health will improve Outcome: Progressing   Problem: Metabolic: Goal: Ability to maintain appropriate glucose levels will improve Outcome: Progressing   Problem: Nutritional: Goal: Maintenance of adequate nutrition will improve Outcome: Progressing   Problem: Activity: Goal: Risk for activity intolerance will decrease Outcome: Progressing   "

## 2024-05-03 NOTE — Plan of Care (Signed)
  Problem: Education: Goal: Ability to describe self-care measures that may prevent or decrease complications (Diabetes Survival Skills Education) will improve Outcome: Progressing Goal: Individualized Educational Video(s) Outcome: Progressing   Problem: Coping: Goal: Ability to adjust to condition or change in health will improve Outcome: Progressing   Problem: Fluid Volume: Goal: Ability to maintain a balanced intake and output will improve Outcome: Progressing   Problem: Health Behavior/Discharge Planning: Goal: Ability to identify and utilize available resources and services will improve Outcome: Progressing Goal: Ability to manage health-related needs will improve Outcome: Progressing   Problem: Metabolic: Goal: Ability to maintain appropriate glucose levels will improve Outcome: Progressing   Problem: Nutritional: Goal: Maintenance of adequate nutrition will improve Outcome: Progressing Goal: Progress toward achieving an optimal weight will improve Outcome: Progressing   Problem: Skin Integrity: Goal: Risk for impaired skin integrity will decrease Outcome: Progressing   Problem: Tissue Perfusion: Goal: Adequacy of tissue perfusion will improve Outcome: Progressing   Problem: Education: Goal: Ability to describe self-care measures that may prevent or decrease complications (Diabetes Survival Skills Education) will improve Outcome: Progressing Goal: Individualized Educational Video(s) Outcome: Progressing   Problem: Cardiac: Goal: Ability to maintain an adequate cardiac output will improve Outcome: Progressing   Problem: Health Behavior/Discharge Planning: Goal: Ability to identify and utilize available resources and services will improve Outcome: Progressing Goal: Ability to manage health-related needs will improve Outcome: Progressing   Problem: Fluid Volume: Goal: Ability to achieve a balanced intake and output will improve Outcome: Progressing    Problem: Metabolic: Goal: Ability to maintain appropriate glucose levels will improve Outcome: Progressing   Problem: Nutritional: Goal: Maintenance of adequate nutrition will improve Outcome: Progressing Goal: Maintenance of adequate weight for body size and type will improve Outcome: Progressing   Problem: Respiratory: Goal: Will regain and/or maintain adequate ventilation Outcome: Progressing   Problem: Urinary Elimination: Goal: Ability to achieve and maintain adequate renal perfusion and functioning will improve Outcome: Progressing   Problem: Education: Goal: Knowledge of General Education information will improve Description: Including pain rating scale, medication(s)/side effects and non-pharmacologic comfort measures Outcome: Progressing   Problem: Health Behavior/Discharge Planning: Goal: Ability to manage health-related needs will improve Outcome: Progressing   Problem: Clinical Measurements: Goal: Ability to maintain clinical measurements within normal limits will improve Outcome: Progressing Goal: Will remain free from infection Outcome: Progressing Goal: Diagnostic test results will improve Outcome: Progressing Goal: Respiratory complications will improve Outcome: Progressing Goal: Cardiovascular complication will be avoided Outcome: Progressing   Problem: Activity: Goal: Risk for activity intolerance will decrease Outcome: Progressing   Problem: Nutrition: Goal: Adequate nutrition will be maintained Outcome: Progressing   Problem: Coping: Goal: Level of anxiety will decrease Outcome: Progressing   Problem: Elimination: Goal: Will not experience complications related to bowel motility Outcome: Progressing Goal: Will not experience complications related to urinary retention Outcome: Progressing   Problem: Pain Managment: Goal: General experience of comfort will improve and/or be controlled Outcome: Progressing   Problem: Safety: Goal:  Ability to remain free from injury will improve Outcome: Progressing   Problem: Skin Integrity: Goal: Risk for impaired skin integrity will decrease Outcome: Progressing   Problem: Safety: Goal: Non-violent Restraint(s) Outcome: Progressing

## 2024-05-04 ENCOUNTER — Other Ambulatory Visit (HOSPITAL_COMMUNITY): Payer: Self-pay

## 2024-05-04 DIAGNOSIS — I5042 Chronic combined systolic (congestive) and diastolic (congestive) heart failure: Secondary | ICD-10-CM

## 2024-05-04 DIAGNOSIS — E1010 Type 1 diabetes mellitus with ketoacidosis without coma: Secondary | ICD-10-CM | POA: Diagnosis not present

## 2024-05-04 DIAGNOSIS — F319 Bipolar disorder, unspecified: Secondary | ICD-10-CM | POA: Diagnosis not present

## 2024-05-04 DIAGNOSIS — N186 End stage renal disease: Secondary | ICD-10-CM | POA: Diagnosis not present

## 2024-05-04 LAB — RENAL FUNCTION PANEL
Albumin: 3.4 g/dL — ABNORMAL LOW (ref 3.5–5.0)
Anion gap: 15 (ref 5–15)
BUN: 22 mg/dL — ABNORMAL HIGH (ref 6–20)
CO2: 23 mmol/L (ref 22–32)
Calcium: 8.5 mg/dL — ABNORMAL LOW (ref 8.9–10.3)
Chloride: 95 mmol/L — ABNORMAL LOW (ref 98–111)
Creatinine, Ser: 3.69 mg/dL — ABNORMAL HIGH (ref 0.44–1.00)
GFR, Estimated: 16 mL/min — ABNORMAL LOW
Glucose, Bld: 315 mg/dL — ABNORMAL HIGH (ref 70–99)
Phosphorus: 3.3 mg/dL (ref 2.5–4.6)
Potassium: 3.8 mmol/L (ref 3.5–5.1)
Sodium: 133 mmol/L — ABNORMAL LOW (ref 135–145)

## 2024-05-04 LAB — GLUCOSE, CAPILLARY
Glucose-Capillary: 212 mg/dL — ABNORMAL HIGH (ref 70–99)
Glucose-Capillary: 186 mg/dL — ABNORMAL HIGH (ref 70–99)

## 2024-05-04 MED ORDER — OLANZAPINE 5 MG PO TABS
5.0000 mg | ORAL_TABLET | Freq: Every day | ORAL | 1 refills | Status: AC
  Filled 2024-05-04: qty 30, 30d supply, fill #0

## 2024-05-04 MED ORDER — HYDROXYZINE HCL 25 MG PO TABS: 25.0000 mg | ORAL_TABLET | Freq: Four times a day (QID) | ORAL | Status: DC | PRN

## 2024-05-04 MED ORDER — OXYCODONE HCL 5 MG PO TABS
5.0000 mg | ORAL_TABLET | Freq: Four times a day (QID) | ORAL | Status: DC | PRN
  Administered 2024-05-04: 5 mg via ORAL
  Filled 2024-05-04: qty 1

## 2024-05-04 MED ORDER — LACOSAMIDE 50 MG PO TABS
100.0000 mg | ORAL_TABLET | Freq: Two times a day (BID) | ORAL | 10 refills | Status: AC
  Filled 2024-05-04: qty 60, 30d supply, fill #0
  Filled 2024-05-04: qty 120, 27d supply, fill #0

## 2024-05-04 MED ORDER — DULOXETINE HCL 20 MG PO CPEP
20.0000 mg | ORAL_CAPSULE | Freq: Every day | ORAL | 3 refills | Status: AC
  Filled 2024-05-04: qty 30, 30d supply, fill #0

## 2024-05-04 MED ORDER — SODIUM CHLORIDE 0.9 % IV SOLN: 50.0000 mg | INTRAVENOUS | Status: DC

## 2024-05-04 MED ORDER — HYDROXYZINE HCL 25 MG PO TABS
25.0000 mg | ORAL_TABLET | Freq: Three times a day (TID) | ORAL | 0 refills | Status: AC | PRN
  Filled 2024-05-04: qty 30, 10d supply, fill #0

## 2024-05-04 NOTE — Progress Notes (Signed)
 D/C order noted. Contacted [facility] to be advised of pt's d/c today and that pt should resume care tomorrow. D/C summary and yesterday's renal note faxed to clinic for continuation of care.   Randine Mungo Dialysis Navigator [phone]

## 2024-05-04 NOTE — TOC Transition Note (Signed)
 Transition of Care [facility]) - Discharge Note   Patient Details  Name: Ashley Freeman MRN: [id number] Date of Birth: 08/29/1990  [facility] CM/SW Contact:  Landry DELENA Senters, RN Phone Number: 05/04/2024, 11:22 AM   Clinical Narrative:     Patient will be d/c home today, with transportation plan in place. Patient has transportation arranged through medicaid, which she takes to dialysis appts.    Patient has no DME at home, does have PCP, manages own medications.   No needs identified by CM.   Final next level of care: Home/Self Care Barriers to Discharge: No Barriers Identified   Patient Goals and CMS Choice            Discharge Placement                       Discharge Plan and Services Additional resources added to the After Visit Summary for                                       Social Drivers of Health (SDOH) Interventions SDOH Screenings   Food Insecurity: No Food Insecurity (05/03/2024)  Housing: Low Risk (05/03/2024)  Transportation Needs: No Transportation Needs (05/03/2024)  Utilities: Not At Risk (05/01/2024)  Financial Resource Strain: Medium Risk (11/15/2023)   Received from [facility]  Physical Activity: Insufficiently Active (03/02/2022)   Received from [facility]  Social Connections: Moderately Isolated (05/03/2024)  Stress: No Stress Concern Present (03/08/2023)   Received from [facility]  Tobacco Use: Low Risk (05/01/2024)     Readmission Risk Interventions    03/23/2024    2:19 PM 02/06/2024    2:01 PM 11/28/2023   11:49 AM  Readmission Risk Prevention Plan  Transportation Screening Complete Complete Complete  Medication Review Oceanographer) Complete Referral to Pharmacy Complete  PCP or Specialist appointment within 3-5 days of discharge Complete Complete Complete  HRI or Home Care Consult Complete Complete Complete  SW Recovery Care/Counseling Consult  Complete   Palliative Care Screening Not  Applicable Not Applicable Not Applicable  Skilled Nursing Facility Not Applicable Not Applicable Not Applicable

## 2024-05-04 NOTE — Plan of Care (Signed)
 Discharged

## 2024-05-04 NOTE — Discharge Summary (Signed)
 "  PATIENT DETAILS Name: Ashley Freeman Age: 34 y.o. Sex: female Date of Birth: 04-14-1991 MRN: [id number]. Admitting Physician: Eva KATHEE Pore, DO ERE:[name], [name], MD  Admit Date: 04/30/2024 Discharge date: 05/04/2024  Recommendations for Outpatient Follow-up:  Follow up with PCP in 1-2 weeks Please obtain CMP/CBC in one week  Admitted From:  Home  Disposition: Home   Discharge Condition: good  CODE STATUS:   Code Status: Full Code   Diet recommendation:  Diet Order             Diet regular Room service appropriate? Yes; Fluid consistency: Thin; Fluid restriction: 2000 mL Fluid  Diet effective now           Diet renal/carb modified with fluid restriction                    Brief Summary: Patient is a 34 y.o.  female with a history of ESRD on HD, recurrent hospitalizations for DKA (presumed noncompliance), bipolar disorder-who presented to the ED with generalized weakness-found to have diabetic ketoacidosis and admitted to the hospitalist service.   Significant events: 1/1>> admit to [facility]-DKA-facial swelling (volume overload versus angioedema)   Significant studies: 1/1>> CXR: Mild pulmonary vascular congestion/interstitial edema.   Significant microbiology data: None   Procedures: None   Consults: Nephrolog  Brief Hospital Course: DKA Resolved with IV insulin /IVF-has been transition to SQ insulin  overnight. Likely secondary to noncompliance.   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  ?  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  Chart reviewed with nephrology-patient had vascular procedure done in May 2025-central veins were patent-likely facial swelling  as secondary to volume issues due to noncompliance with HD.  Much improved after hemodialysis-back to baseline per patient Due to concern of angioedema-Entresto  held-added to allergy list.  Patient aware as well-to avoid ARB/ACEI in the future.   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) Volume removal with HD Continue beta-blocker No longer on Entresto -if need be-and blood pressure can tolerate-start hydralazine /Imdur  in the outpatient setting-currently BP soft in the low 100s.   ESRD on HD Nephrology followed closely-underwent HD at the discretion of nephrology service.   Normocytic anemia Secondary to ESRD Defer Aranesp /iron  therapies to nephrology service   Acute toxic/metabolic encephalopathy Secondary to numerous doses of fentanyl /Haldol /Ativan  that the patient received on day of admission Now much more awake/alert-essentially back to baseline   Seizure disorder Continue Vimpat  Reviewed most recent neurology note-no longer on Keppra /Lamictal  due to compliance issues-and switch to Vimpat .   History of bipolar disorder Appears to be on Zyprexa  and Cymbalta  No longer on Seroquel .   History of medication noncompliance Recently hospitalized and signed out AMA    Discharge Diagnoses:  Principal Problem:   DKA (diabetic ketoacidosis) (HCC) Active Problems:   End-stage renal disease on hemodialysis (HCC)   Bipolar disorder (HCC)   Chronic combined systolic and diastolic heart failure (HCC)   History of anemia due to chronic kidney disease   Generalized weakness   Discharge Instructions:  Activity:  As tolerated Discharge Instructions     Diet renal/carb modified with fluid restriction   Complete by: As directed    Discharge instructions   Complete by: As directed    Follow with Primary MD  [name],  [name], MD in 1-2 weeks  Please take your insulin  as prescribed  Please follow-up with your dialysis clinic   Please get a complete blood count and  chemistry panel checked by your Primary MD at your next visit, and again as instructed by your Primary MD.  Get Medicines reviewed and adjusted: Please take all your medications with you for your next visit with your Primary MD  Laboratory/radiological data: Please request your Primary MD to go over all hospital tests and procedure/radiological results at the follow up, please ask your Primary MD to get all Hospital records sent to his/her office.  In some cases, they will be blood work, cultures and biopsy results pending at the time of your discharge. Please request that your primary care M.D. follows up on these results.  Also Note the following: If you experience worsening of your admission symptoms, develop shortness of breath, life threatening emergency, suicidal or homicidal thoughts you must seek medical attention immediately by calling 911 or calling your MD immediately  if symptoms less severe.  You must read complete instructions/literature along with all the possible adverse reactions/side effects for all the Medicines you take and that have been prescribed to you. Take any new Medicines after you have completely understood and accpet all the possible adverse reactions/side effects.   Do not drive when taking Pain medications or sleeping medications (Benzodaizepines)  Do not take more than prescribed Pain, Sleep and Anxiety Medications. It is not advisable to combine anxiety,sleep and pain medications without talking with your primary care practitioner  Special Instructions: If you have smoked or chewed Tobacco  in the last 2 yrs please stop smoking, stop any regular Alcohol   and or any Recreational drug use.  Wear Seat belts while driving.  Please note: You were cared for by a hospitalist during your hospital stay. Once you are discharged, your primary care physician will handle any further medical issues. Please note that NO REFILLS for any discharge medications will be  authorized once you are discharged, as it is imperative that you return to your primary care physician (or establish a relationship with a primary care physician if you do not have one) for your post hospital discharge needs so that they can reassess your need for medications and monitor your lab values.     Seizure precautions: Per [place]  DMV statutes, patients with seizures are not allowed to drive until they have been seizure-free for six months and cleared by a physician    Use caution when using heavy equipment or power tools. Avoid working on ladders or at heights. Take showers instead of baths. Ensure the water  temperature is not too high on the home water  heater. Do not go swimming alone. Do not lock yourself in a room alone (i.e. bathroom). When caring for infants or small children, sit down when holding, feeding, or changing them to minimize risk of injury to the child in the event you have a seizure. Maintain good sleep hygiene. Avoid alcohol .    If patient has another seizure, call 911 and bring them back to the ED if: A.  The seizure lasts longer than 5 minutes.      B.  The patient doesn't wake shortly after the seizure or has new problems such as difficulty seeing, speaking or moving following the seizure C.  The patient was injured during the seizure D.  The patient has a temperature over 102 F (39C) E.  The patient vomited during the seizure and now  is having trouble breathing    During the Seizure   - First, ensure adequate ventilation and place patients on the floor on their left side  Loosen clothing around the neck and ensure the airway is patent. If the patient is clenching the teeth, do not force the mouth open with any object as this can cause severe damage - Remove all items from the surrounding that can be hazardous. The patient may be oblivious to what's happening and may not even know what he or she is doing. If the patient is confused and wandering, either  gently guide him/her away and block access to outside areas - Reassure the individual and be comforting - Call 911. In most cases, the seizure ends before EMS arrives. However, there are cases when seizures may last over 3 to 5 minutes. Or the individual may have developed breathing difficulties or severe injuries. If a pregnant patient or a person with diabetes develops a seizure, it is prudent to call an ambulance. - Finally, if the patient does not regain full consciousness, then call EMS. Most patients will remain confused for about 45 to 90 minutes after a seizure, so you must use judgment in calling for help. - Avoid restraints but make sure the patient is in a bed with padded side rails - Place the individual in a lateral position with the neck slightly flexed; this will help the saliva drain from the mouth and prevent the tongue from falling backward - Remove all nearby furniture and other hazards from the area - Provide verbal assurance as the individual is regaining consciousness - Provide the patient with privacy if possible - Call for help and start treatment as ordered by the caregiver    After the Seizure (Postictal Stage)   After a seizure, most patients experience confusion, fatigue, muscle pain and/or a headache. Thus, one should permit the individual to sleep. For the next few days, reassurance is essential. Being calm and helping reorient the person is also of importance.   Most seizures are painless and end spontaneously. Seizures are not harmful to others but can lead to complications such as stress on the lungs, brain and the heart. Individuals with prior lung problems may develop labored breathing and respiratory distress.    Increase activity slowly   Complete by: As directed    No wound care   Complete by: As directed       Allergies as of 05/04/2024       Reactions   Keflex  [cephalexin ] Anaphylaxis   Ceftriaxone  in the past with no reaction   Penicillins  Anaphylaxis, Hives, Rash   Vibramycin  [doxycycline ] Anaphylaxis   Benadryl  [diphenhydramine ] Itching   Entresto  [sacubitril -valsartan ] Swelling   angioedema   Dilaudid  [hydromorphone ] Itching   Methotrexate And Trimetrexate Rash   Roxicodone  [oxycodone ] Itching   Takes Percocet without issue        Medication List     PAUSE taking these medications    rosuvastatin  40 MG tablet Wait to take this until your doctor or other care provider tells you to start again. Follow repeat liver function tests with your PCP before resuming this Commonly known as: CRESTOR  Take 40 mg by mouth daily.       STOP taking these medications    Entresto  24-26 MG Generic drug: sacubitril -valsartan    lamoTRIgine  25 MG tablet Commonly known as: LaMICtal    levETIRAcetam  250 MG tablet Commonly known as: KEPPRA        TAKE these medications  albuterol  108 (90 Base) MCG/ACT inhaler Commonly known as: VENTOLIN  HFA Inhale 2 puffs into the lungs every 4 (four) hours as needed for wheezing or shortness of breath.   bumetanide  2 MG tablet Commonly known as: BUMEX  Take 10 mg by mouth daily.   carvedilol  25 MG tablet Commonly known as: COREG  Take 1 tablet (25 mg total) by mouth 2 (two) times daily with a meal. Follow with your PCP for refills.   Cinnamon 500 MG capsule Take 500 mg by mouth in the morning.   diazePAM  (15 MG Dose) 2 x 7.5 MG/0.1ML Lqpk Place 15 mg into the nose as needed (sz lasting over 2 minutes).   DULoxetine  20 MG capsule Commonly known as: CYMBALTA  Take 1 capsule (20 mg total) by mouth daily.   ELDERBERRY IMMUNE HEALTH GUMMY PO Take 2 each by mouth in the morning.   fluticasone  50 MCG/ACT nasal spray Commonly known as: FLONASE  Place 2 sprays into both nostrils daily as needed for allergies or rhinitis.   hydrOXYzine  25 MG tablet Commonly known as: ATARAX  Take 1 tablet (25 mg total) by mouth 3 (three) times daily as needed for anxiety, itching, nausea or  vomiting.   Lacosamide  100 MG Tabs Commonly known as: Vimpat  Take 1 tablet (100 mg total) by mouth 2 (two) times daily. Take additional 50 mg after dialysis on Tuesdays, Thursdays and Saturdays   Lantus  SoloStar 100 UNIT/ML Solostar Pen Generic drug: insulin  glargine Inject 5 Units into the skin daily.   NovoLOG  FlexPen 100 UNIT/ML FlexPen Generic drug: insulin  aspart Before each meal 3 times a day, 140-199 - 2 units, 200-250 - 4 units, 251-299 - 6 units,  300-349 - 7 units,  350 or above 8 units.   OLANZapine  5 MG tablet Commonly known as: ZYPREXA  Take 1 tablet (5 mg total) by mouth at bedtime.   Pancrelipase  (Lip-Prot-Amyl) 3000-9500 units Cpep Take 3,000 Units by mouth in the morning, at noon, and at bedtime.   pantoprazole  40 MG tablet Commonly known as: PROTONIX  Take 1 tablet (40 mg total) by mouth 2 (two) times daily.   prochlorperazine  5 MG tablet Commonly known as: COMPAZINE  Take 5 mg by mouth every 6 (six) hours as needed for nausea or vomiting.   sevelamer  carbonate 800 MG tablet Commonly known as: RENVELA  Take 1 tablet (800 mg total) by mouth 3 (three) times daily with meals.   sodium bicarbonate  650 MG tablet Take 650 mg by mouth 2 (two) times daily.   SUMAtriptan  50 MG tablet Commonly known as: IMITREX  Take 50 mg by mouth every 2 (two) hours as needed for migraine or headache.   Veltassa  16.8 g Pack Generic drug: Patiromer  Sorbitex Calcium  Take 1 Dose by mouth daily at 12 noon.        Follow-up Information     [name], [name], MD. Schedule an appointment as soon as possible for a visit in 1 week(s).   Specialty: Family Medicine Contact information: [address] [place] [address] [phone]                Allergies[1]   Other Procedures/Studies: DG Chest Port 1 View Result Date: 04/30/2024 EXAM: 1 VIEW(S) XRAY OF THE CHEST 04/30/2024 08:32:51 PM COMPARISON: 04/24/2024 CLINICAL HISTORY: SOB FINDINGS: LUNGS AND PLEURA: Low lung  volumes. Perihilar vascular fullness with bilateral interstitial prominence. Decreased retrocardiac opacity. Trace right pleural effusion. No focal pulmonary opacity. No pneumothorax. HEART AND MEDIASTINUM: Unchanged cardiomegaly. BONES AND SOFT TISSUES: No acute osseous abnormality. IMPRESSION: 1.  Mild pulmonary vascular congestion and interstitial edema. 2. Trace right pleural effusion. 3. Unchanged cardiomegaly. Electronically signed by: Greig Pique MD 04/30/2024 08:38 PM EST RP Workstation: [id number]   DG CHEST PORT 1 VIEW Result Date: 04/24/2024 CLINICAL DATA:  Pleural effusion. EXAM: PORTABLE CHEST 1 VIEW COMPARISON:  Radiograph yesterday FINDINGS: The endotracheal tube tip is 2.8 cm from the carina. Tip and side port of the enteric tube below the diaphragm in the stomach. Lung volumes are low. Small bilateral pleural effusions. On the right this is likely more layering than on prior exam. Persistent right perihilar opacity, although improved from prior. Central vascular congestion. No pneumothorax. IMPRESSION: 1. Small bilateral pleural effusions. On the right this is likely more layering than on prior exam. 2. Persistent right perihilar opacity, although improved from prior. 3. Central vascular congestion. Electronically Signed   By: Andrea Gasman M.D.   On: 04/24/2024 14:13   Overnight EEG with video Result Date: 04/24/2024 Shelton Arlin KIDD, MD     04/25/2024  7:55 AM Patient Name: Ashley Freeman MRN: [id number] Epilepsy Attending: Arlin KIDD Shelton Referring Physician/Provider: everitt Clint Abbey Earle FORBES, NP Duration: 04/23/2024 1644 to 04/24/2024 1644 Patient history:  34 y.o. female who originally presented with generalized pain. She was noted to have an extremely high blood glucose and was found to be in DKA.  She then had seizure activity in the emergency department. EEG to evaluate for seizure Level of alertness:comatose AEDs during EEG study: LCM, Propofol  Technical aspects:  This EEG study was done with scalp electrodes positioned according to the 10-20 International system of electrode placement. Electrical activity was reviewed with band pass filter of 1-70Hz , sensitivity of 7 uV/mm, display speed of 38mm/sec with a 60Hz  notched filter applied as appropriate. EEG data were recorded continuously and digitally stored.  Video monitoring was available and reviewed as appropriate. Description: EEG initially showed burst suppression with bursts of generalized 5 to 6 Hz theta slowing lasting 1-2 seconds admixed with 3-7 seconds of generalized suppression. Gradually EEG evolved into near continuous generalized 3-6hz  theta-delta slowing. Hyperventilation and photic stimulation were not performed.   EKG artifact was seen during the study ABNORMALITY - Burst suppression, generalized - Continuous slow, generalized IMPRESSION: This study is suggestive of severe to profound diffuse encephalopathy, likely related to sedation. No seizures or epileptiform discharges were seen throughout the recording. Arlin KIDD Shelton   DG Abd Portable 1V Result Date: 04/23/2024 CLINICAL DATA:  Nasogastric tube placement. EXAM: PORTABLE ABDOMEN - 1 VIEW COMPARISON:  None Available. FINDINGS: Nasogastric tube terminates in the stomach with the side port a few cm beyond the gastroesophageal junction. Surgical clips in the right upper quadrant. Abdomen is otherwise grossly unremarkable. IMPRESSION: Nasogastric tube terminates in the stomach. Chest radiograph dictated separately. Electronically Signed   By: Newell Eke M.D.   On: 04/23/2024 16:38   DG CHEST PORT 1 VIEW Result Date: 04/23/2024 CLINICAL DATA:  Nasogastric tube placement. EXAM: PORTABLE CHEST 1 VIEW COMPARISON:  04/23/2024 and CT chest 04/16/2024. FINDINGS: Endotracheal to terminates approximately 2.5 cm above the carina. Nasogastric tube is followed into the stomach with the side port likely a few cm beyond the gastroesophageal junction. Tip  extends beyond the inferior margin of the image. Patient is rotated. Heart is enlarged. New right upper and right lower lobe collapse/consolidation with left perihilar and left lower lobe volume loss. Moderate right pleural effusion. No pneumothorax. IMPRESSION: 1. Satisfactory endotracheal tube placement. 2. New collapse/consolidation in the right upper and right lower  lobes, possibly due to mucous plugging. 3. Moderate right pleural effusion. 4. Left perihilar and left lower lobe atelectasis. Electronically Signed   By: Newell Eke M.D.   On: 04/23/2024 16:37   DG Chest 2 View Result Date: 04/23/2024 CLINICAL DATA:  Recent pneumonia.  Fatigue. EXAM: CHEST - 2 VIEW COMPARISON:  04/13/2024 FINDINGS: Low volume film. The cardio pericardial silhouette is enlarged. Diffuse right-sided and retrocardiac left base airspace disease seen previously has decreased in the interval. No dense focal consolidative airspace disease on the current study. No substantial pleural effusion. No acute bony abnormality. Telemetry leads overlie the chest. IMPRESSION: Interval decrease in diffuse right-sided and retrocardiac left base airspace disease. No dense focal consolidative airspace disease on the current study. Electronically Signed   By: Camellia Candle M.D.   On: 04/23/2024 10:12   CT CHEST WO CONTRAST Result Date: 04/17/2024 EXAM: CT CHEST WITHOUT CONTRAST 04/16/2024 08:37:00 PM TECHNIQUE: CT of the chest was performed without the administration of intravenous contrast. Multiplanar reformatted images are provided for review. Automated exposure control, iterative reconstruction, and/or weight based adjustment of the mA/kV was utilized to reduce the radiation dose to as low as reasonably achievable. COMPARISON: None available. CLINICAL HISTORY: Respiratory illness, nondiagnostic xray. FINDINGS: MEDIASTINUM: Mild coronary artery calcification. Mild cardiomegaly. No pericardial effusion. The central airways are clear. No  central obstructing lesion. LYMPH NODES: No mediastinal, hilar or axillary lymphadenopathy. LUNGS AND PLEURA: Small bilateral pleural effusions. Diffuse ground-glass pulmonary infiltrate is noted with associated smooth and lobular septal thickening, best appreciated within the lung bases. These findings are consistent with small to moderate interstitial and alveolar pulmonary edema. No pneumothorax. SOFT TISSUES/BONES: Diffuse subcutaneous body wall edema consistent with anasarca and/or cardiogenic failure. No acute abnormality of the bones. UPPER ABDOMEN: Limited images of the upper abdomen demonstrates mild ascites. IMPRESSION: 1. Mild cardiomegaly and mild coronary artery calcification. 2. Small to moderate pulmonary edema. Small bilateral pleural effusions. Mild ascites and diffuse subcutaneous body wall edema, consistent with anasarca and/or cardiogenic failure. Electronically signed by: Dorethia Molt MD 04/17/2024 01:16 AM EST RP Workstation: [id number]   EEG adult Result Date: 04/15/2024 Shelton Arlin KIDD, MD     04/15/2024  5:38 PM Patient Name: Ashley Freeman MRN: [id number] Epilepsy Attending: Arlin KIDD Shelton Referring Physician/Provider: Perri DELENA Meliton Mickey., MD Date: 04/15/2024 Duration: 24.20 mins Patient history: 34yo female with seizure. EEG to evaluate for seizure. Level of alertness: Awake AEDs during EEG study: LEV, LTG Technical aspects: This EEG study was done with scalp electrodes positioned according to the 10-20 International system of electrode placement. Electrical activity was reviewed with band pass filter of 1-70Hz , sensitivity of 7 uV/mm, display speed of 97mm/sec with a 60Hz  notched filter applied as appropriate. EEG data were recorded continuously and digitally stored.  Video monitoring was available and reviewed as appropriate. Description: The posterior dominant rhythm consists of 8 Hz activity of moderate voltage (25-35 uV) seen predominantly in posterior head  regions, symmetric and reactive to eye opening and eye closing. Physiologic photic driving was not seen during photic stimulation. Hyperventilation was not performed.   Of note, parts of study were difficult due to significant electrode and movement artifact. IMPRESSION: This study is within normal limits. No seizures or epileptiform discharges were seen throughout the recording. A normal interictal EEG does not exclude the diagnosis of epilepsy. Arlin KIDD Shelton   DG Chest Port 1 View Result Date: 04/13/2024 EXAM: 1 VIEW(S) XRAY OF THE CHEST 04/13/2024 04:21:00 PM COMPARISON: 04/06/2024 CLINICAL HISTORY:  cough FINDINGS: LINES, TUBES AND DEVICES: Right axillary stent. LUNGS AND PLEURA: Asymmetric interstitial/perihilar prominence, right lung predominant, favoring asymmetric interstitial edema over multifocal infection. Superimposed right lower lobe opacity favors pneumonia over atelectasis. Small bilateral pleural effusions, layering on the right. No pneumothorax. HEART AND MEDIASTINUM: Mild cardiomegaly is improved. BONES AND SOFT TISSUES: No acute osseous abnormality. IMPRESSION: 1. Suspected asymmetric interstitial edema, right lung predominant, less likely multifocal infection. 2. Superimposed right lower lobe opacity, favoring pneumonia over atelectasis. 3. Small bilateral pleural effusions, layering on the right. Electronically signed by: Pinkie Pebbles MD 04/13/2024 05:04 PM EST RP Workstation: [id number]   CT Head Wo Contrast Result Date: 04/13/2024 EXAM: CT HEAD WITHOUT 04/13/2024 04:33:32 PM TECHNIQUE: CT of the head was performed without the administration of intravenous contrast. Automated exposure control, iterative reconstruction, and/or weight based adjustment of the mA/kV was utilized to reduce the radiation dose to as low as reasonably achievable. COMPARISON: 11/11/2023 CLINICAL HISTORY: Seizure, new-onset, no history of trauma FINDINGS: BRAIN AND VENTRICLES: No acute intracranial  hemorrhage. No mass effect or midline shift. No extra-axial fluid collection. No evidence of acute infarct. No hydrocephalus. Atherosclerosis of the carotid siphons and intracranial vertebral arteries. ORBITS: Bilateral lens replacement. SINUSES AND MASTOIDS: Mild mucosal thickening in the paranasal sinuses. SOFT TISSUES AND SKULL: No acute skull fracture. There is diffuse stranding of the facial subcutaneous fat bilaterally which could reflect edema or anasarca. IMPRESSION: 1. No acute intracranial abnormality. 2. Skull base atherosclerosis greater than expected for age. 3. There is diffuse stranding of the facial subcutaneous fat bilaterally which could reflect edema/anasarca. Electronically signed by: Donnice Mania MD 04/13/2024 04:42 PM EST RP Workstation: [id number]   IR Paracentesis Result Date: 04/06/2024 INDICATION: 34 year old female with history of ESRD, on HD, with recurrent ascites. IR is requested for diagnostic and therapeutic paracentesis. EXAM: ULTRASOUND GUIDED DIAGNOSTIC AND THERAPEUTIC PARACENTESIS MEDICATIONS: 5 cc of 1% lidocaine . COMPLICATIONS: None immediate. PROCEDURE: Informed written consent was obtained from the patient after a discussion of the risks, benefits and alternatives to treatment. A timeout was performed prior to the initiation of the procedure. Initial ultrasound scanning demonstrates a large amount of ascites within the right lower abdominal quadrant. The right lower abdomen was prepped and draped in the usual sterile fashion. 1% lidocaine  was used for local anesthesia. Following this, a 19 gauge, 7-cm, Yueh catheter was introduced. An ultrasound image was saved for documentation purposes. The paracentesis was performed. The catheter was removed and a dressing was applied. The patient tolerated the procedure well without immediate post procedural complication. FINDINGS: A total of approximately 3.5 L of clear, straw-colored fluid was removed. Samples were sent to the  laboratory as requested by the clinical team. IMPRESSION: Successful ultrasound-guided paracentesis yielding 3.5 liters of peritoneal fluid. Performed by: Carlin Griffon, PA-C Electronically Signed   By: Wilkie Lent M.D.   On: 04/06/2024 14:59   DG Chest Port 1 View Result Date: 04/06/2024 CLINICAL DATA:  Sepsis. EXAM: PORTABLE CHEST 1 VIEW COMPARISON:  03/28/2024 FINDINGS: The cardio pericardial silhouette is enlarged. Focal consolidative opacity at the right base with pleural effusion. Low lung volumes with vascular congestion. No acute bony abnormality. Telemetry leads overlie the chest. IMPRESSION: Focal consolidative opacity at the right base with pleural effusion. Imaging features compatible with pneumonia. Follow-up imaging recommended to ensure resolution. Low lung volumes with cardiomegaly. Electronically Signed   By: Camellia Candle M.D.   On: 04/06/2024 10:23     TODAY-DAY OF DISCHARGE:  Subjective:   Ashley Freeman today has  no headache,no chest abdominal pain,no new weakness tingling or numbness, feels much better wants to go home today.   Objective:   Blood pressure (!) 117/44, pulse 79, temperature 98.2 F (36.8 C), temperature source Oral, resp. rate 18, height 5' 3 (1.6 m), weight 62.1 kg, SpO2 98%.  Intake/Output Summary (Last 24 hours) at 05/04/2024 1021 Last data filed at 05/04/2024 0400 Gross per 24 hour  Intake --  Output 2500 ml  Net -2500 ml   Filed Weights   05/02/24 0500 05/03/24 1109 05/03/24 1438  Weight: 59.2 kg 63.5 kg 62.1 kg    Exam: Awake Alert, Oriented *3, No new F.N deficits, Normal affect [place].AT,PERRAL Supple Neck,No JVD, No cervical lymphadenopathy appriciated.  Symmetrical Chest wall movement, Good air movement bilaterally, CTAB RRR,No Gallops,Rubs or new Murmurs, No Parasternal Heave +ve B.Sounds, Abd Soft, Non tender, No organomegaly appriciated, No rebound -guarding or rigidity. No Cyanosis, Clubbing or edema, No new Rash or  bruise   PERTINENT RADIOLOGIC STUDIES: No results found.   PERTINENT LAB RESULTS: CBC: Recent Labs    05/02/24 0639 05/03/24 0521  WBC 14.5* 11.2*  HGB 12.0 9.7*  HCT 35.2* 28.9*  PLT 247 205   CMET CMP     Component Value Date/Time   NA 133 (L) 05/04/2024 0352   NA 136 07/13/2021 1553   NA 137 11/12/2013 0423   K 3.8 05/04/2024 0352   K 4.2 11/12/2013 0423   CL 95 (L) 05/04/2024 0352   CL 107 11/12/2013 0423   CO2 23 05/04/2024 0352   CO2 23 11/12/2013 0423   GLUCOSE 315 (H) 05/04/2024 0352   GLUCOSE 339 (H) 11/12/2013 0423   BUN 22 (H) 05/04/2024 0352   BUN 51 (H) 07/13/2021 1553   BUN 7 11/12/2013 0423   CREATININE 3.69 (H) 05/04/2024 0352   CREATININE 0.59 (L) 11/12/2013 0423   CREATININE 0.51 08/24/2013 0957   CALCIUM  8.5 (L) 05/04/2024 0352   CALCIUM  8.2 (L) 11/12/2013 0423   PROT 5.8 (L) 04/23/2024 0910   PROT 5.9 (L) 09/08/2019 0945   PROT 8.0 11/10/2013 2043   ALBUMIN  3.4 (L) 05/04/2024 0352   ALBUMIN  2.9 (L) 09/08/2019 0945   ALBUMIN  3.9 11/10/2013 2043   AST 59 (H) 04/23/2024 0910   AST 12 (L) 11/10/2013 2043   ALT 133 (H) 04/23/2024 0910   ALT 10 (L) 11/10/2013 2043   ALKPHOS 722 (H) 04/23/2024 0910   ALKPHOS 77 11/10/2013 2043   BILITOT 0.6 04/23/2024 0910   BILITOT <0.2 09/08/2019 0945   BILITOT 0.5 11/10/2013 2043   GFR 27.98 (L) 05/24/2021 1552   EGFR 24 (L) 07/13/2021 1553   GFRNONAA 16 (L) 05/04/2024 0352   GFRNONAA >60 11/12/2013 0423   GFRNONAA >89 08/24/2013 0957    GFR Estimated Creatinine Clearance: 18.3 mL/min (A) (by C-G formula based on SCr of 3.69 mg/dL (H)). No results for input(s): LIPASE, AMYLASE in the last 72 hours. No results for input(s): CKTOTAL, CKMB, CKMBINDEX, TROPONINI in the last 72 hours. Invalid input(s): POCBNP No results for input(s): DDIMER in the last 72 hours. No results for input(s): HGBA1C in the last 72 hours. No results for input(s): CHOL, HDL, LDLCALC, TRIG, CHOLHDL,  LDLDIRECT in the last 72 hours. No results for input(s): TSH, T4TOTAL, T3FREE, THYROIDAB in the last 72 hours.  Invalid input(s): FREET3 No results for input(s): VITAMINB12, FOLATE, FERRITIN, TIBC, IRON , RETICCTPCT in the last 72 hours. Coags: No results for input(s): INR in the last 72 hours.  Invalid input(s):  PT Microbiology: Recent Results (from the past 240 hours)  Culture, blood (Routine X 2) w Reflex to ID Panel     Status: None   Collection Time: 04/24/24 12:16 PM   Specimen: BLOOD LEFT HAND  Result Value Ref Range Status   Specimen Description BLOOD LEFT HAND  Final   Special Requests   Final    BOTTLES DRAWN AEROBIC ONLY Blood Culture results may not be optimal due to an inadequate volume of blood received in culture bottles   Culture   Final    NO GROWTH 5 DAYS Performed at [facility], [address], [place], [place] [address]    Report Status 04/29/2024 FINAL  Final  Culture, blood (Routine X 2) w Reflex to ID Panel     Status: None   Collection Time: 04/24/24 12:17 PM   Specimen: BLOOD LEFT HAND  Result Value Ref Range Status   Specimen Description BLOOD LEFT HAND  Final   Special Requests   Final    BOTTLES DRAWN AEROBIC ONLY Blood Culture results may not be optimal due to an inadequate volume of blood received in culture bottles   Culture   Final    NO GROWTH 5 DAYS Performed at [facility], [address] [place], [place] [address]    Report Status 04/29/2024 FINAL  Final    FURTHER DISCHARGE INSTRUCTIONS:  Get Medicines reviewed and adjusted: Please take all your medications with you for your next visit with your Primary MD  Laboratory/radiological data: Please request your Primary MD to go over all hospital tests and procedure/radiological results at the follow up, please ask your Primary MD to get all Hospital records sent to his/her office.  In some cases, they will be blood work, cultures and biopsy results  pending at the time of your discharge. Please request that your primary care M.D. goes through all the records of your hospital data and follows up on these results.  Also Note the following: If you experience worsening of your admission symptoms, develop shortness of breath, life threatening emergency, suicidal or homicidal thoughts you must seek medical attention immediately by calling 911 or calling your MD immediately  if symptoms less severe.  You must read complete instructions/literature along with all the possible adverse reactions/side effects for all the Medicines you take and that have been prescribed to you. Take any new Medicines after you have completely understood and accpet all the possible adverse reactions/side effects.   Do not drive when taking Pain medications or sleeping medications (Benzodaizepines)  Do not take more than prescribed Pain, Sleep and Anxiety Medications. It is not advisable to combine anxiety,sleep and pain medications without talking with your primary care practitioner  Special Instructions: If you have smoked or chewed Tobacco  in the last 2 yrs please stop smoking, stop any regular Alcohol   and or any Recreational drug use.  Wear Seat belts while driving.  Please note: You were cared for by a hospitalist during your hospital stay. Once you are discharged, your primary care physician will handle any further medical issues. Please note that NO REFILLS for any discharge medications will be authorized once you are discharged, as it is imperative that you return to your primary care physician (or establish a relationship with a primary care physician if you do not have one) for your post hospital discharge needs so that they can reassess your need for medications and monitor your lab values.  Total Time spent coordinating discharge  including counseling, education and face to face time equals greater than 30 minutes.  Signed: Donalda Applebaum 05/04/2024 10:21  AM      [1]  Allergies Allergen Reactions   Keflex  [Cephalexin ] Anaphylaxis    Ceftriaxone  in the past with no reaction   Penicillins Anaphylaxis, Hives and Rash   Vibramycin  [Doxycycline ] Anaphylaxis   Benadryl  [Diphenhydramine ] Itching   Entresto  [Sacubitril -Valsartan ] Swelling    angioedema   Dilaudid  [Hydromorphone ] Itching   Methotrexate And Trimetrexate Rash   Roxicodone  [Oxycodone ] Itching    Takes Percocet without issue   "

## 2024-05-04 NOTE — Plan of Care (Signed)
 Patient is progressing towards goals of care.    Problem: Education: Goal: Ability to describe self-care measures that may prevent or decrease complications (Diabetes Survival Skills Education) will improve Outcome: Progressing Goal: Individualized Educational Video(s) Outcome: Progressing   Problem: Coping: Goal: Ability to adjust to condition or change in health will improve Outcome: Progressing   Problem: Fluid Volume: Goal: Ability to maintain a balanced intake and output will improve Outcome: Progressing   Problem: Health Behavior/Discharge Planning: Goal: Ability to identify and utilize available resources and services will improve Outcome: Progressing Goal: Ability to manage health-related needs will improve Outcome: Progressing   Problem: Metabolic: Goal: Ability to maintain appropriate glucose levels will improve Outcome: Progressing   Problem: Nutritional: Goal: Maintenance of adequate nutrition will improve Outcome: Progressing Goal: Progress toward achieving an optimal weight will improve Outcome: Progressing   Problem: Skin Integrity: Goal: Risk for impaired skin integrity will decrease Outcome: Progressing   Problem: Tissue Perfusion: Goal: Adequacy of tissue perfusion will improve Outcome: Progressing   Problem: Education: Goal: Ability to describe self-care measures that may prevent or decrease complications (Diabetes Survival Skills Education) will improve Outcome: Progressing Goal: Individualized Educational Video(s) Outcome: Progressing   Problem: Cardiac: Goal: Ability to maintain an adequate cardiac output will improve Outcome: Progressing   Problem: Health Behavior/Discharge Planning: Goal: Ability to identify and utilize available resources and services will improve Outcome: Progressing Goal: Ability to manage health-related needs will improve Outcome: Progressing   Problem: Fluid Volume: Goal: Ability to achieve a balanced intake and  output will improve Outcome: Progressing   Problem: Metabolic: Goal: Ability to maintain appropriate glucose levels will improve Outcome: Progressing   Problem: Nutritional: Goal: Maintenance of adequate nutrition will improve Outcome: Progressing Goal: Maintenance of adequate weight for body size and type will improve Outcome: Progressing   Problem: Respiratory: Goal: Will regain and/or maintain adequate ventilation Outcome: Progressing   Problem: Urinary Elimination: Goal: Ability to achieve and maintain adequate renal perfusion and functioning will improve Outcome: Progressing   Problem: Education: Goal: Knowledge of General Education information will improve Description: Including pain rating scale, medication(s)/side effects and non-pharmacologic comfort measures Outcome: Progressing   Problem: Health Behavior/Discharge Planning: Goal: Ability to manage health-related needs will improve Outcome: Progressing   Problem: Clinical Measurements: Goal: Ability to maintain clinical measurements within normal limits will improve Outcome: Progressing Goal: Will remain free from infection Outcome: Progressing Goal: Diagnostic test results will improve Outcome: Progressing Goal: Respiratory complications will improve Outcome: Progressing Goal: Cardiovascular complication will be avoided Outcome: Progressing   Problem: Activity: Goal: Risk for activity intolerance will decrease Outcome: Progressing   Problem: Nutrition: Goal: Adequate nutrition will be maintained Outcome: Progressing   Problem: Coping: Goal: Level of anxiety will decrease Outcome: Progressing   Problem: Elimination: Goal: Will not experience complications related to bowel motility Outcome: Progressing Goal: Will not experience complications related to urinary retention Outcome: Progressing   Problem: Pain Managment: Goal: General experience of comfort will improve and/or be controlled Outcome:  Progressing   Problem: Safety: Goal: Ability to remain free from injury will improve Outcome: Progressing   Problem: Skin Integrity: Goal: Risk for impaired skin integrity will decrease Outcome: Progressing   Problem: Safety: Goal: Non-violent Restraint(s) Outcome: Progressing

## 2024-05-05 ENCOUNTER — Other Ambulatory Visit: Payer: Self-pay

## 2024-05-05 ENCOUNTER — Encounter (HOSPITAL_COMMUNITY): Payer: Self-pay

## 2024-05-05 ENCOUNTER — Inpatient Hospital Stay (HOSPITAL_COMMUNITY)
Admission: EM | Admit: 2024-05-05 | Discharge: 2024-05-10 | DRG: 637 | Disposition: A | Discharge status: Home / Self Care | Attending: Internal Medicine | Admitting: Internal Medicine

## 2024-05-05 DIAGNOSIS — E1010 Type 1 diabetes mellitus with ketoacidosis without coma: Principal | ICD-10-CM | POA: Diagnosis present

## 2024-05-05 DIAGNOSIS — E785 Hyperlipidemia, unspecified: Secondary | ICD-10-CM | POA: Diagnosis present

## 2024-05-05 DIAGNOSIS — Z825 Family history of asthma and other chronic lower respiratory diseases: Secondary | ICD-10-CM

## 2024-05-05 DIAGNOSIS — E16A2 Hypoglycemia level 2: Secondary | ICD-10-CM | POA: Diagnosis present

## 2024-05-05 DIAGNOSIS — E1022 Type 1 diabetes mellitus with diabetic chronic kidney disease: Secondary | ICD-10-CM | POA: Diagnosis present

## 2024-05-05 DIAGNOSIS — Z881 Allergy status to other antibiotic agents status: Secondary | ICD-10-CM

## 2024-05-05 DIAGNOSIS — Z794 Long term (current) use of insulin: Secondary | ICD-10-CM

## 2024-05-05 DIAGNOSIS — I132 Hypertensive heart and chronic kidney disease with heart failure and with stage 5 chronic kidney disease, or end stage renal disease: Secondary | ICD-10-CM | POA: Diagnosis present

## 2024-05-05 DIAGNOSIS — E10649 Type 1 diabetes mellitus with hypoglycemia without coma: Secondary | ICD-10-CM | POA: Diagnosis present

## 2024-05-05 DIAGNOSIS — Z888 Allergy status to other drugs, medicaments and biological substances status: Secondary | ICD-10-CM

## 2024-05-05 DIAGNOSIS — Z885 Allergy status to narcotic agent status: Secondary | ICD-10-CM

## 2024-05-05 DIAGNOSIS — E1110 Type 2 diabetes mellitus with ketoacidosis without coma: Secondary | ICD-10-CM | POA: Diagnosis present

## 2024-05-05 DIAGNOSIS — Z79899 Other long term (current) drug therapy: Secondary | ICD-10-CM

## 2024-05-05 DIAGNOSIS — I5020 Unspecified systolic (congestive) heart failure: Secondary | ICD-10-CM | POA: Diagnosis present

## 2024-05-05 DIAGNOSIS — K8689 Other specified diseases of pancreas: Secondary | ICD-10-CM | POA: Diagnosis present

## 2024-05-05 DIAGNOSIS — Z9049 Acquired absence of other specified parts of digestive tract: Secondary | ICD-10-CM

## 2024-05-05 DIAGNOSIS — D649 Anemia, unspecified: Secondary | ICD-10-CM | POA: Diagnosis present

## 2024-05-05 DIAGNOSIS — G40909 Epilepsy, unspecified, not intractable, without status epilepticus: Secondary | ICD-10-CM | POA: Diagnosis present

## 2024-05-05 DIAGNOSIS — D631 Anemia in chronic kidney disease: Secondary | ICD-10-CM | POA: Diagnosis present

## 2024-05-05 DIAGNOSIS — F319 Bipolar disorder, unspecified: Secondary | ICD-10-CM | POA: Diagnosis present

## 2024-05-05 DIAGNOSIS — I428 Other cardiomyopathies: Secondary | ICD-10-CM | POA: Diagnosis present

## 2024-05-05 DIAGNOSIS — Z833 Family history of diabetes mellitus: Secondary | ICD-10-CM

## 2024-05-05 DIAGNOSIS — R188 Other ascites: Secondary | ICD-10-CM | POA: Diagnosis present

## 2024-05-05 DIAGNOSIS — I5022 Chronic systolic (congestive) heart failure: Secondary | ICD-10-CM | POA: Diagnosis present

## 2024-05-05 DIAGNOSIS — G43909 Migraine, unspecified, not intractable, without status migrainosus: Secondary | ICD-10-CM | POA: Diagnosis present

## 2024-05-05 DIAGNOSIS — Z560 Unemployment, unspecified: Secondary | ICD-10-CM

## 2024-05-05 DIAGNOSIS — N186 End stage renal disease: Secondary | ICD-10-CM | POA: Diagnosis present

## 2024-05-05 DIAGNOSIS — Z880 Allergy status to penicillin: Secondary | ICD-10-CM

## 2024-05-05 LAB — CBC WITH DIFFERENTIAL/PLATELET
Abs Immature Granulocytes: 0.04 K/uL (ref 0.00–0.07)
Basophils Absolute: 0.1 K/uL (ref 0.0–0.1)
Basophils Relative: 1 %
Eosinophils Absolute: 0.3 K/uL (ref 0.0–0.5)
Eosinophils Relative: 3 %
HCT: 30.6 % — ABNORMAL LOW (ref 36.0–46.0)
Hemoglobin: 9.3 g/dL — ABNORMAL LOW (ref 12.0–15.0)
Immature Granulocytes: 0 %
Lymphocytes Relative: 28 %
Lymphs Abs: 2.8 K/uL (ref 0.7–4.0)
MCH: 26.6 pg (ref 26.0–34.0)
MCHC: 30.4 g/dL (ref 30.0–36.0)
MCV: 87.4 fL (ref 80.0–100.0)
Monocytes Absolute: 0.6 K/uL (ref 0.1–1.0)
Monocytes Relative: 6 %
Neutro Abs: 6.2 K/uL (ref 1.7–7.7)
Neutrophils Relative %: 62 %
Platelets: 195 K/uL (ref 150–400)
RBC: 3.5 MIL/uL — ABNORMAL LOW (ref 3.87–5.11)
RDW: 14.6 % (ref 11.5–15.5)
WBC: 10 K/uL (ref 4.0–10.5)
nRBC: 0 % (ref 0.0–0.2)

## 2024-05-05 LAB — COMPREHENSIVE METABOLIC PANEL WITH GFR
ALT: 65 U/L — ABNORMAL HIGH (ref 0–44)
AST: 190 U/L — ABNORMAL HIGH (ref 15–41)
Albumin: 3.6 g/dL (ref 3.5–5.0)
Alkaline Phosphatase: 555 U/L — ABNORMAL HIGH (ref 38–126)
Anion gap: 24 — ABNORMAL HIGH (ref 5–15)
BUN: 36 mg/dL — ABNORMAL HIGH (ref 6–20)
CO2: 14 mmol/L — ABNORMAL LOW (ref 22–32)
Calcium: 8.7 mg/dL — ABNORMAL LOW (ref 8.9–10.3)
Chloride: 94 mmol/L — ABNORMAL LOW (ref 98–111)
Creatinine, Ser: 5.09 mg/dL — ABNORMAL HIGH (ref 0.44–1.00)
GFR, Estimated: 11 mL/min — ABNORMAL LOW
Glucose, Bld: 826 mg/dL (ref 70–99)
Potassium: 4.4 mmol/L (ref 3.5–5.1)
Sodium: 131 mmol/L — ABNORMAL LOW (ref 135–145)
Total Bilirubin: 0.4 mg/dL (ref 0.0–1.2)
Total Protein: 5.9 g/dL — ABNORMAL LOW (ref 6.5–8.1)

## 2024-05-05 LAB — HCG, QUANTITATIVE, PREGNANCY: hCG, Beta Chain, Quant, S: <1 m[IU]/mL

## 2024-05-05 MED ORDER — INSULIN REGULAR(HUMAN) IN NACL 100-0.9 UT/100ML-% IV SOLN
INTRAVENOUS | Status: DC
  Administered 2024-05-06: 5.5 [IU]/h via INTRAVENOUS
  Filled 2024-05-05: qty 100

## 2024-05-05 MED ORDER — FENTANYL CITRATE (PF) 50 MCG/ML IJ SOSY
100.0000 ug | PREFILLED_SYRINGE | Freq: Once | INTRAMUSCULAR | Status: AC
  Administered 2024-05-06: 100 ug via INTRAVENOUS
  Filled 2024-05-05: qty 2

## 2024-05-05 MED ORDER — POTASSIUM CHLORIDE 10 MEQ/100ML IV SOLN
10.0000 meq | INTRAVENOUS | Status: AC
  Administered 2024-05-06 (×2): 10 meq via INTRAVENOUS
  Filled 2024-05-05 (×2): qty 100

## 2024-05-05 MED ORDER — LACTATED RINGERS IV BOLUS
20.0000 mL/kg | Freq: Once | INTRAVENOUS | Status: AC
  Administered 2024-05-06: 1242 mL via INTRAVENOUS

## 2024-05-05 MED ORDER — DEXTROSE 50 % IV SOLN: 0.0000 mL | INTRAVENOUS | Status: DC | PRN

## 2024-05-05 MED ORDER — SODIUM CHLORIDE 0.9 % IV BOLUS
1000.0000 mL | Freq: Once | INTRAVENOUS | Status: AC
  Administered 2024-05-06: 1000 mL via INTRAVENOUS

## 2024-05-05 MED ORDER — LACTATED RINGERS IV SOLN: INTRAVENOUS | Status: DC

## 2024-05-05 MED ORDER — DEXTROSE IN LACTATED RINGERS 5 % IV SOLN: INTRAVENOUS | Status: DC

## 2024-05-05 NOTE — ED Triage Notes (Addendum)
 Pt reports with a migraine and body aches that started today. Pt recently seen for a reaction from her heart medication.

## 2024-05-05 NOTE — ED Provider Notes (Signed)
 " [facility] Emergency Department Provider Note MRN:  [id number]  Arrival date & time: 05/06/2024     Chief Complaint   Malaise History of Present Illness   Ashley Freeman is a 34 y.o. year-old female with a history of type 1 diabetes presenting to the ED with chief complaint of malaise.  Patient does not feel good.  Having bodyaches, headache, cough.  Feels generally unwell.  Blood sugar really high.  Reports compliance with insulin  at home.  Review of Systems  A thorough review of systems was obtained and all systems are negative except as noted in the HPI and PMH.   Patient's Health History    Past Medical History:  Diagnosis Date   Abscess, gluteal, right 08/24/2013   Anemia 02/19/2012   Bartholin's gland abscess 09/19/2013   Bipolar disorder (HCC)    BV (bacterial vaginosis) 11/24/2015   Depression    Diabetes mellitus type I (HCC) 2001   Diagnosed at age 20 ; Type I   Diarrhea 05/30/2016   DKA (diabetic ketoacidoses) 08/19/2013   Also in 2018   ESRD (end stage renal disease) (HCC)    Gonorrhea 08/2011   Treated in 09/2011   HFrEF (heart failure with reduced ejection fraction) (HCC)    a. 2022 Echo: EF 40%; b. 10/2021 Echo: EF 55%; b. 07/2022 MV: No ischemia. EF 31%; c. 08/2022 Echo: EF 35%, mildly dil RV, sev TR.   History of trichomoniasis 05/31/2016   Hyperlipidemia 03/28/2016   Hypertension    NICM (nonischemic cardiomyopathy) (HCC)    Sepsis (HCC) 09/19/2013    Past Surgical History:  Procedure Laterality Date   A/V FISTULAGRAM Right 06/17/2023   Procedure: A/V Fistulagram;  Surgeon: Marea Selinda GORMAN, MD;  Location: [facility] INVASIVE CV LAB;  Service: Cardiovascular;  Laterality: Right;   A/V SHUNT INTERVENTION N/A 09/25/2023   Procedure: A/V SHUNT INTERVENTION;  Surgeon: Pearline Norman GORMAN, MD;  Location: [facility] PV LAB;  Service: Cardiovascular;  Laterality: N/A;   AV FISTULA PLACEMENT Right 07/06/2022   Procedure: ARTERIOVENOUS GRAFT  CREATION;  Surgeon: Gretta Lonni PARAS, MD;  Location: [facility] OR;  Service: Vascular;  Laterality: Right;   CESAREAN SECTION N/A 10/05/2019   Procedure: CESAREAN SECTION;  Surgeon: Izell Harari, MD;  Location: [facility] LD ORS;  Service: Obstetrics;  Laterality: N/A;   CHOLECYSTECTOMY N/A 07/02/2023   Procedure: LAPAROSCOPIC CHOLECYSTECTOMY;  Surgeon: Ebbie Cough, MD;  Location: [facility] OR;  Service: General;  Laterality: N/A;   ESOPHAGOGASTRODUODENOSCOPY N/A 01/31/2024   Procedure: EGD (ESOPHAGOGASTRODUODENOSCOPY);  Surgeon: San Sandor GAILS, DO;  Location: [facility] ENDOSCOPY;  Service: Gastroenterology;  Laterality: N/A;   INCISION AND DRAINAGE ABSCESS Left 09/28/2019   Procedure: INCISION AND DRAINAGE VULVAR ABCESS;  Surgeon: Edsel Norleen GAILS, MD;  Location: [facility] OR;  Service: Gynecology;  Laterality: Left;   INCISION AND DRAINAGE ABSCESS Right 02/23/2024   Procedure: INCISION AND DRAINAGE, ABSCESS;  Surgeon: Tobie Eldora NOVAK, MD;  Location: [facility] OR;  Service: ENT;  Laterality: Right;   INCISION AND DRAINAGE PERIRECTAL ABSCESS Right 08/18/2013   Procedure: IRRIGATION AND DEBRIDEMENT GLUTEAL ABSCESS;  Surgeon: Lynda Leos, MD;  Location: [facility] OR;  Service: General;  Laterality: Right;   INCISION AND DRAINAGE PERIRECTAL ABSCESS Right 09/19/2013   Procedure: IRRIGATION AND DEBRIDEMENT RIGHT GLUTEAL AND LABIAL ABSCESSES;  Surgeon: Lynda Leos, MD;  Location: [facility] OR;  Service: General;  Laterality: Right;   INCISION AND DRAINAGE PERIRECTAL ABSCESS Right 09/24/2013   Procedure: IRRIGATION AND DEBRIDEMENT PERIRECTAL ABSCESS;  Surgeon: Lynwood KIDD  Kimble, MD;  Location: [facility] OR;  Service: General;  Laterality: Right;   IR PARACENTESIS  08/28/2023   IR PARACENTESIS  11/04/2023   IR PARACENTESIS  12/23/2023   IR PARACENTESIS  02/04/2024   IR PARACENTESIS  03/23/2024   IR PARACENTESIS  04/06/2024    Family History  Problem Relation Age of Onset   Asthma Mother    Carpal tunnel syndrome Mother    Gout Father     Diabetes Paternal Grandmother    Anesthesia problems Neg Hx     Social History   Socioeconomic History   Marital status: Single    Spouse name: Not on file   Number of children: 0   Years of education: 11th grade   Highest education level: Not on file  Occupational History   Occupation: unemployed    Comment: has never worked  Tobacco Use   Smoking status: Never    Passive exposure: Never   Smokeless tobacco: Never  Vaping Use   Vaping status: Never Used  Substance and Sexual Activity   Alcohol  use: Not Currently   Drug use: No   Sexual activity: Yes    Birth control/protection: None  Other Topics Concern   Not on file  Social History Narrative   Patient lives in [place] mother lives in [place].  Unemployed.  Previously worked for a customer service manager.  Completed 11 grade working on [employer]. Patient 3 brothers    Social Drivers of Health   Tobacco Use: Low Risk (05/05/2024)   Patient History    Smoking Tobacco Use: Never    Smokeless Tobacco Use: Never    Passive Exposure: Never  Financial Resource Strain: Medium Risk (11/15/2023)   Received from [facility]   Overall Financial Resource Strain (CARDIA)    How hard is it for you to pay for the very basics like food, housing, medical care, and heating?: Somewhat hard  Food Insecurity: No Food Insecurity (05/03/2024)   Epic    Worried About Radiation Protection Practitioner of Food in the Last Year: Never true    Ran Out of Food in the Last Year: Never true  Transportation Needs: No Transportation Needs (05/03/2024)   Epic    Lack of Transportation (Medical): No    Lack of Transportation (Non-Medical): No  Physical Activity: Insufficiently Active (03/02/2022)   Received from [facility]   Exercise Vital Sign    On average, how many days per week do you engage in moderate to strenuous exercise (like a brisk walk)?: 2 days    On average, how many minutes do you engage in exercise at this level?: 10 min  Stress: No Stress Concern Present  (03/08/2023)   Received from [facility] of Occupational Health - Occupational Stress Questionnaire    Feeling of Stress : Not at all  Social Connections: Moderately Isolated (05/03/2024)   Social Connection and Isolation Panel    Frequency of Communication with Friends and Family: Three times a week    Frequency of Social Gatherings with Friends and Family: Once a week    Attends Religious Services: 1 to 4 times per year    Active Member of [employer] or Organizations: No    Attends Banker Meetings: Never    Marital Status: Never married  Intimate Partner Violence: Not At Risk (05/02/2024)   Epic    Fear of Current or Ex-Partner: No    Emotionally Abused: No    Physically Abused: No    Sexually  Abused: No  Depression (PHQ2-9): Not on file  Alcohol  Screen: Not on file  Housing: Low Risk (05/03/2024)   Epic    Unable to Pay for Housing in the Last Year: No    Number of Times Moved in the Last Year: 0    Homeless in the Last Year: No  Utilities: Not At Risk (05/01/2024)   Epic    Threatened with loss of utilities: No  Health Literacy: Not on file     Physical Exam   Vitals:   05/05/24 2139 05/06/24 0024  BP: (!) 185/94 (!) 159/81  Pulse: 87 98  Resp: 18 (!) 21  Temp: 98.5 F (36.9 C) 98.4 F (36.9 C)  SpO2: 100% 100%    CONSTITUTIONAL:-Chronically illappearing, NAD NEURO/PSYCH:  Alert and oriented x 3, no focal deficits EYES:  eyes equal and reactive ENT/NECK:  no LAD, no JVD CARDIO: Regular rate, well-perfused, normal S1 and S2 PULM:  CTAB no wheezing or rhonchi GI/GU:  non-distended, non-tender MSK/SPINE:  No gross deformities, no edema SKIN:  no rash, atraumatic   *Additional and/or pertinent findings included in MDM below  Diagnostic and Interventional Summary    EKG Interpretation Date/Time:  Wednesday May 06 2024 00:24:28 EST Ventricular Rate:  96 PR Interval:  135 QRS Duration:  90 QT Interval:  390 QTC Calculation: 493 R  Axis:   63  Text Interpretation: Sinus rhythm Borderline prolonged QT interval Confirmed by Theadore Sharper [phone]) on 05/06/2024 1:15:27 AM       Labs Reviewed  CBC WITH DIFFERENTIAL/PLATELET - Abnormal; Notable for the following components:      Result Value   RBC 3.50 (*)    Hemoglobin 9.3 (*)    HCT 30.6 (*)    All other components within normal limits  COMPREHENSIVE METABOLIC PANEL WITH GFR - Abnormal; Notable for the following components:   Sodium 131 (*)    Chloride 94 (*)    CO2 14 (*)    Glucose, Bld 826 (*)    BUN 36 (*)    Creatinine, Ser 5.09 (*)    Calcium  8.7 (*)    Total Protein 5.9 (*)    AST 190 (*)    ALT 65 (*)    Alkaline Phosphatase 555 (*)    GFR, Estimated 11 (*)    Anion gap 24 (*)    All other components within normal limits  BLOOD GAS, VENOUS - Abnormal; Notable for the following components:   pH, Ven 7.24 (*)    pCO2, Ven 37 (*)    pO2, Ven 48 (*)    Bicarbonate 15.9 (*)    Acid-base deficit 10.7 (*)    All other components within normal limits  CBG MONITORING, ED - Abnormal; Notable for the following components:   Glucose-Capillary >600 (*)    All other components within normal limits  CBG MONITORING, ED - Abnormal; Notable for the following components:   Glucose-Capillary >600 (*)    All other components within normal limits  HCG, QUANTITATIVE, PREGNANCY  HCG, SERUM, QUALITATIVE  BETA-HYDROXYBUTYRIC ACID  BASIC METABOLIC PANEL WITH GFR  BASIC METABOLIC PANEL WITH GFR  BASIC METABOLIC PANEL WITH GFR  BASIC METABOLIC PANEL WITH GFR  BASIC METABOLIC PANEL WITH GFR  BETA-HYDROXYBUTYRIC ACID  BETA-HYDROXYBUTYRIC ACID  BETA-HYDROXYBUTYRIC ACID  BETA-HYDROXYBUTYRIC ACID  BETA-HYDROXYBUTYRIC ACID  CBC WITH DIFFERENTIAL/PLATELET  URINALYSIS, ROUTINE W REFLEX MICROSCOPIC  BLOOD GAS, VENOUS    DG Chest Port 1 View  Final Result  Medications  lactated ringers  bolus 1,242 mL (has no administration in time range)  insulin  regular, human  (MYXREDLIN ) 100 units/ 100 mL infusion (5.5 Units/hr Intravenous Rate/Dose Verify 05/06/24 0112)  lactated ringers  infusion (has no administration in time range)  dextrose  5 % in lactated ringers  infusion (has no administration in time range)  dextrose  50 % solution 0-50 mL (has no administration in time range)  potassium chloride  10 mEq in 100 mL IVPB (10 mEq Intravenous New Bag/Given 05/06/24 0044)  sodium chloride  0.9 % bolus 1,000 mL (1,000 mLs Intravenous New Bag/Given 05/06/24 0036)  fentaNYL  (SUBLIMAZE ) injection 100 mcg (100 mcg Intravenous Given 05/06/24 0022)     Procedures  /  Critical Care .Critical Care  Performed by: Theadore Ozell HERO, MD Authorized by: Theadore Ozell HERO, MD   Critical care provider statement:    Critical care time (minutes):  45   Critical care was necessary to treat or prevent imminent or life-threatening deterioration of the following conditions:  Metabolic crisis   Critical care was time spent personally by me on the following activities:  Development of treatment plan with patient or surrogate, discussions with consultants, evaluation of patient's response to treatment, examination of patient, ordering and review of laboratory studies, ordering and review of radiographic studies, ordering and performing treatments and interventions, pulse oximetry, re-evaluation of patient's condition and review of old charts   ED Course and Medical Decision Making  Initial Impression and Ddx CBG greater than 800, suspect symptoms related to diabetic ketoacidosis.  No focal neurological deficits on exam, abdomen is soft, no increased work of breathing.  Past medical/surgical history that increases complexity of ED encounter: Type 1 diabetes  Interpretation of Diagnostics I personally reviewed the EKG and my interpretation is as follows: Sinus rhythm  Labs seem consistent with diabetic ketoacidosis  Patient Reassessment and Ultimate Disposition/Management     Plan is for  hospitalist admission.  Starting fluids and insulin .  Patient management required discussion with the following services or consulting groups:  Hospitalist Service  Complexity of Problems Addressed Acute illness or injury that poses threat of life of bodily function  Additional Data Reviewed and Analyzed Further history obtained from: Prior labs/imaging results  Additional Factors Impacting ED Encounter Risk Consideration of hospitalization  Ozell HERO. Theadore, MD [facility] [email or web address]  Final Clinical Impressions(s) / ED Diagnoses     ICD-10-CM   1. Diabetic ketoacidosis without coma associated with type 1 diabetes mellitus (HCC)  E10.10       ED Discharge Orders     None        Discharge Instructions Discussed with and Provided to Patient:   Discharge Instructions   None      Theadore Ozell HERO, MD 05/06/24 0117  "

## 2024-05-06 ENCOUNTER — Emergency Department (HOSPITAL_COMMUNITY)

## 2024-05-06 ENCOUNTER — Inpatient Hospital Stay (HOSPITAL_COMMUNITY)

## 2024-05-06 ENCOUNTER — Encounter (HOSPITAL_COMMUNITY): Payer: Self-pay | Admitting: Internal Medicine

## 2024-05-06 DIAGNOSIS — N186 End stage renal disease: Secondary | ICD-10-CM | POA: Diagnosis not present

## 2024-05-06 DIAGNOSIS — E1010 Type 1 diabetes mellitus with ketoacidosis without coma: Secondary | ICD-10-CM | POA: Diagnosis not present

## 2024-05-06 LAB — CBG MONITORING, ED
Glucose-Capillary: 101 mg/dL — ABNORMAL HIGH (ref 70–99)
Glucose-Capillary: 146 mg/dL — ABNORMAL HIGH (ref 70–99)
Glucose-Capillary: 188 mg/dL — ABNORMAL HIGH (ref 70–99)
Glucose-Capillary: 252 mg/dL — ABNORMAL HIGH (ref 70–99)
Glucose-Capillary: 38 mg/dL — CL (ref 70–99)
Glucose-Capillary: 394 mg/dL — ABNORMAL HIGH (ref 70–99)
Glucose-Capillary: 42 mg/dL — CL (ref 70–99)
Glucose-Capillary: 457 mg/dL — ABNORMAL HIGH (ref 70–99)
Glucose-Capillary: 474 mg/dL — ABNORMAL HIGH (ref 70–99)
Glucose-Capillary: 530 mg/dL (ref 70–99)
Glucose-Capillary: 541 mg/dL (ref 70–99)
Glucose-Capillary: 594 mg/dL (ref 70–99)
Glucose-Capillary: 98 mg/dL (ref 70–99)
Glucose-Capillary: >600 mg/dL (ref 70–99)
Glucose-Capillary: >600 mg/dL (ref 70–99)
Glucose-Capillary: >600 mg/dL (ref 70–99)
Glucose-Capillary: >600 mg/dL (ref 70–99)
Glucose-Capillary: >600 mg/dL (ref 70–99)

## 2024-05-06 LAB — CBC
HCT: 27.7 % — ABNORMAL LOW (ref 36.0–46.0)
Hemoglobin: 8.5 g/dL — ABNORMAL LOW (ref 12.0–15.0)
MCH: 26.5 pg (ref 26.0–34.0)
MCHC: 30.7 g/dL (ref 30.0–36.0)
MCV: 86.3 fL (ref 80.0–100.0)
Platelets: 182 K/uL (ref 150–400)
RBC: 3.21 MIL/uL — ABNORMAL LOW (ref 3.87–5.11)
RDW: 14.4 % (ref 11.5–15.5)
WBC: 11.1 K/uL — ABNORMAL HIGH (ref 4.0–10.5)
nRBC: 0 % (ref 0.0–0.2)

## 2024-05-06 LAB — BASIC METABOLIC PANEL WITH GFR
Anion gap: 19 — ABNORMAL HIGH (ref 5–15)
Anion gap: 22 — ABNORMAL HIGH (ref 5–15)
BUN: 37 mg/dL — ABNORMAL HIGH (ref 6–20)
BUN: 35 mg/dL — ABNORMAL HIGH (ref 6–20)
CO2: 16 mmol/L — ABNORMAL LOW (ref 22–32)
CO2: 16 mmol/L — ABNORMAL LOW (ref 22–32)
Calcium: 8.1 mg/dL — ABNORMAL LOW (ref 8.9–10.3)
Calcium: 8.4 mg/dL — ABNORMAL LOW (ref 8.9–10.3)
Chloride: 96 mmol/L — ABNORMAL LOW (ref 98–111)
Chloride: 98 mmol/L (ref 98–111)
Creatinine, Ser: 5.18 mg/dL — ABNORMAL HIGH (ref 0.44–1.00)
Creatinine, Ser: 5.48 mg/dL — ABNORMAL HIGH (ref 0.44–1.00)
GFR, Estimated: 11 mL/min — ABNORMAL LOW
GFR, Estimated: 10 mL/min — ABNORMAL LOW
Glucose, Bld: 744 mg/dL (ref 70–99)
Glucose, Bld: 383 mg/dL — ABNORMAL HIGH (ref 70–99)
Potassium: 4.1 mmol/L (ref 3.5–5.1)
Potassium: 3.7 mmol/L (ref 3.5–5.1)
Sodium: 131 mmol/L — ABNORMAL LOW (ref 135–145)
Sodium: 136 mmol/L (ref 135–145)

## 2024-05-06 LAB — SARS CORONAVIRUS 2 BY RT PCR: SARS Coronavirus 2 by RT PCR: NEGATIVE

## 2024-05-06 LAB — GLUCOSE, CAPILLARY
Glucose-Capillary: 72 mg/dL (ref 70–99)
Glucose-Capillary: 39 mg/dL — CL (ref 70–99)
Glucose-Capillary: 51 mg/dL — ABNORMAL LOW (ref 70–99)
Glucose-Capillary: 148 mg/dL — ABNORMAL HIGH (ref 70–99)
Glucose-Capillary: 62 mg/dL — ABNORMAL LOW (ref 70–99)
Glucose-Capillary: 66 mg/dL — ABNORMAL LOW (ref 70–99)
Glucose-Capillary: 112 mg/dL — ABNORMAL HIGH (ref 70–99)

## 2024-05-06 LAB — RESPIRATORY PANEL BY PCR

## 2024-05-06 LAB — BLOOD GAS, VENOUS
Acid-base deficit: 10.7 mmol/L — ABNORMAL HIGH (ref 0.0–2.0)
Bicarbonate: 15.9 mmol/L — ABNORMAL LOW (ref 20.0–28.0)
O2 Saturation: 78.2 %
Patient temperature: 37
pCO2, Ven: 37 mmHg — ABNORMAL LOW (ref 44–60)
pH, Ven: 7.24 — ABNORMAL LOW (ref 7.25–7.43)
pO2, Ven: 48 mmHg — ABNORMAL HIGH (ref 32–45)

## 2024-05-06 LAB — BETA-HYDROXYBUTYRIC ACID
Beta-Hydroxybutyric Acid: 5.6 mmol/L — ABNORMAL HIGH (ref 0.05–0.27)
Beta-Hydroxybutyric Acid: 2.5 mmol/L — ABNORMAL HIGH (ref 0.05–0.27)
Beta-Hydroxybutyric Acid: 0.37 mmol/L — ABNORMAL HIGH (ref 0.05–0.27)

## 2024-05-06 LAB — CK: Total CK: 79 U/L (ref 38–234)

## 2024-05-06 MED ORDER — CARVEDILOL 12.5 MG PO TABS
12.5000 mg | ORAL_TABLET | Freq: Two times a day (BID) | ORAL | Status: DC
  Administered 2024-05-06 – 2024-05-10 (×6): 12.5 mg via ORAL
  Filled 2024-05-06 (×6): qty 1

## 2024-05-06 MED ORDER — LACTATED RINGERS IV SOLN: INTRAVENOUS | Status: AC

## 2024-05-06 MED ORDER — DEXTROSE 50 % IV SOLN
0.0000 mL | INTRAVENOUS | Status: DC | PRN
  Administered 2024-05-06 – 2024-05-07 (×5): 50 mL via INTRAVENOUS
  Administered 2024-05-07: 25 mL via INTRAVENOUS
  Filled 2024-05-06 (×7): qty 50

## 2024-05-06 MED ORDER — LACOSAMIDE 50 MG PO TABS
100.0000 mg | ORAL_TABLET | Freq: Two times a day (BID) | ORAL | Status: DC
  Administered 2024-05-06 – 2024-05-07 (×3): 100 mg via ORAL
  Filled 2024-05-06 (×3): qty 2

## 2024-05-06 MED ORDER — PANCRELIPASE (LIP-PROT-AMYL) 12000-38000 UNITS PO CPEP
12000.0000 [IU] | ORAL_CAPSULE | Freq: Three times a day (TID) | ORAL | Status: DC
  Administered 2024-05-06 – 2024-05-10 (×8): 12000 [IU] via ORAL
  Filled 2024-05-06 (×14): qty 1

## 2024-05-06 MED ORDER — OLANZAPINE 5 MG PO TABS
5.0000 mg | ORAL_TABLET | Freq: Every day | ORAL | Status: DC
  Administered 2024-05-06 – 2024-05-09 (×4): 5 mg via ORAL
  Filled 2024-05-06 (×5): qty 1

## 2024-05-06 MED ORDER — HEPARIN SODIUM (PORCINE) 5000 UNIT/ML IJ SOLN
5000.0000 [IU] | Freq: Three times a day (TID) | INTRAMUSCULAR | Status: DC
  Administered 2024-05-06 – 2024-05-09 (×6): 5000 [IU] via SUBCUTANEOUS
  Filled 2024-05-06 (×11): qty 1

## 2024-05-06 MED ORDER — INSULIN GLARGINE-YFGN 100 UNIT/ML ~~LOC~~ SOLN
10.0000 [IU] | SUBCUTANEOUS | Status: DC
  Administered 2024-05-06: 10 [IU] via SUBCUTANEOUS
  Filled 2024-05-06: qty 0.1

## 2024-05-06 MED ORDER — INSULIN REGULAR(HUMAN) IN NACL 100-0.9 UT/100ML-% IV SOLN
INTRAVENOUS | Status: DC
  Administered 2024-05-06: 4.8 [IU]/h via INTRAVENOUS

## 2024-05-06 MED ORDER — HYDROXYZINE HCL 25 MG PO TABS
25.0000 mg | ORAL_TABLET | Freq: Three times a day (TID) | ORAL | Status: DC | PRN
  Administered 2024-05-09: 25 mg via ORAL
  Filled 2024-05-06: qty 1

## 2024-05-06 MED ORDER — DULOXETINE HCL 20 MG PO CPEP
20.0000 mg | ORAL_CAPSULE | Freq: Every day | ORAL | Status: DC
  Administered 2024-05-06 – 2024-05-10 (×4): 20 mg via ORAL
  Filled 2024-05-06 (×4): qty 1

## 2024-05-06 MED ORDER — INSULIN GLARGINE-YFGN 100 UNIT/ML ~~LOC~~ SOLN
5.0000 [IU] | SUBCUTANEOUS | Status: AC
  Administered 2024-05-07 – 2024-05-10 (×4): 5 [IU] via SUBCUTANEOUS
  Filled 2024-05-06 (×4): qty 0.05

## 2024-05-06 MED ORDER — CARVEDILOL 12.5 MG PO TABS
25.0000 mg | ORAL_TABLET | Freq: Two times a day (BID) | ORAL | Status: DC
  Administered 2024-05-06: 25 mg via ORAL
  Filled 2024-05-06: qty 2

## 2024-05-06 MED ORDER — OXYCODONE-ACETAMINOPHEN 5-325 MG PO TABS
1.0000 | ORAL_TABLET | Freq: Three times a day (TID) | ORAL | Status: DC | PRN
  Administered 2024-05-06 – 2024-05-10 (×8): 1 via ORAL
  Filled 2024-05-06 (×10): qty 1

## 2024-05-06 MED ORDER — DEXTROSE IN LACTATED RINGERS 5 % IV SOLN: INTRAVENOUS | Status: AC

## 2024-05-06 MED ORDER — FENTANYL CITRATE (PF) 50 MCG/ML IJ SOSY
25.0000 ug | PREFILLED_SYRINGE | INTRAMUSCULAR | Status: DC | PRN
  Administered 2024-05-06 – 2024-05-10 (×19): 25 ug via INTRAVENOUS
  Filled 2024-05-06 (×19): qty 1

## 2024-05-06 MED ORDER — INSULIN ASPART 100 UNIT/ML IJ SOLN: 2.0000 [IU] | Freq: Three times a day (TID) | INTRAMUSCULAR | Status: DC

## 2024-05-06 MED ORDER — FENTANYL CITRATE (PF) 50 MCG/ML IJ SOSY
25.0000 ug | PREFILLED_SYRINGE | INTRAMUSCULAR | Status: AC | PRN
  Administered 2024-05-06 (×2): 25 ug via INTRAVENOUS
  Filled 2024-05-06 (×2): qty 1

## 2024-05-06 MED ORDER — ACETAMINOPHEN 325 MG PO TABS: 650.0000 mg | ORAL_TABLET | Freq: Four times a day (QID) | ORAL | Status: DC | PRN

## 2024-05-06 MED ORDER — INSULIN ASPART 100 UNIT/ML IJ SOLN
0.0000 [IU] | Freq: Three times a day (TID) | INTRAMUSCULAR | Status: DC
  Administered 2024-05-08: 1 [IU] via SUBCUTANEOUS
  Administered 2024-05-09: 5 [IU] via SUBCUTANEOUS
  Administered 2024-05-09 – 2024-05-10 (×2): 2 [IU] via SUBCUTANEOUS
  Administered 2024-05-10: 1 [IU] via SUBCUTANEOUS
  Filled 2024-05-06 (×5): qty 1

## 2024-05-06 MED ORDER — LACOSAMIDE 50 MG PO TABS: 50.0000 mg | ORAL_TABLET | ORAL | Status: DC

## 2024-05-06 MED ORDER — PANCRELIPASE (LIP-PROT-AMYL) 3000-9500 UNITS PO CPEP: 3000.0000 [IU] | ORAL_CAPSULE | Freq: Three times a day (TID) | ORAL | Status: DC

## 2024-05-06 NOTE — Plan of Care (Signed)
" °  Problem: Coping: Goal: Ability to adjust to condition or change in health will improve 05/06/2024 2332 by Corlis Erminio CROME, RN Outcome: Progressing 05/06/2024 2320 by Corlis Erminio CROME, RN Outcome: Progressing   "

## 2024-05-06 NOTE — H&P (Signed)
 " History and Physical    Ashley Freeman FMW:[id number] DOB: 1990/05/15 DOA: 05/05/2024  Patient coming from: Home.  Chief Complaint: Generalized body ache.  HPI: Ashley Freeman is a 34 y.o. female with history of diabetes mellitus type 1 who has had recurrent admissions for DKA recently discharged on May 05, 2023 two days ago presents back to the ER with complaints of generalized body ache not feeling well.  Patient states she has been taking her insulin  at home.  Patient also recently admitted for facial swelling Entresto  was discontinued with concern for possible angioedema.  Denies any fever chills nausea vomiting or diarrhea.  Complains of generalized abdominal discomfort.  ED Course: In the ER chest x-ray shows chronic features.  Labs showed beta-hydroxybutyrate acid of 5.6 blood glucose of 826 hemoglobin 9.3 creatinine 5 anion gap of 24.  Patient was started on IV insulin  infusion per DKA protocol.  Admitted for further management.  Review of Systems: As per HPI, rest all negative.   Past Medical History:  Diagnosis Date   Abscess, gluteal, right 08/24/2013   Anemia 02/19/2012   Bartholin's gland abscess 09/19/2013   Bipolar disorder (HCC)    BV (bacterial vaginosis) 11/24/2015   Depression    Diabetes mellitus type I (HCC) 2001   Diagnosed at age 12 ; Type I   Diarrhea 05/30/2016   DKA (diabetic ketoacidoses) 08/19/2013   Also in 2018   ESRD (end stage renal disease) (HCC)    Gonorrhea 08/2011   Treated in 09/2011   HFrEF (heart failure with reduced ejection fraction) (HCC)    a. 2022 Echo: EF 40%; b. 10/2021 Echo: EF 55%; b. 07/2022 MV: No ischemia. EF 31%; c. 08/2022 Echo: EF 35%, mildly dil RV, sev TR.   History of trichomoniasis 05/31/2016   Hyperlipidemia 03/28/2016   Hypertension    NICM (nonischemic cardiomyopathy) (HCC)    Sepsis (HCC) 09/19/2013    Past Surgical History:  Procedure Laterality Date   A/V FISTULAGRAM Right 06/17/2023    Procedure: A/V Fistulagram;  Surgeon: Marea Selinda RAMAN, MD;  Location: [facility] INVASIVE CV LAB;  Service: Cardiovascular;  Laterality: Right;   A/V SHUNT INTERVENTION N/A 09/25/2023   Procedure: A/V SHUNT INTERVENTION;  Surgeon: Pearline Norman RAMAN, MD;  Location: [facility] LAB;  Service: Cardiovascular;  Laterality: N/A;   AV FISTULA PLACEMENT Right 07/06/2022   Procedure: ARTERIOVENOUS GRAFT CREATION;  Surgeon: Gretta Lonni PARAS, MD;  Location: [facility] OR;  Service: Vascular;  Laterality: Right;   CESAREAN SECTION N/A 10/05/2019   Procedure: CESAREAN SECTION;  Surgeon: Izell Harari, MD;  Location: [facility] ORS;  Service: Obstetrics;  Laterality: N/A;   CHOLECYSTECTOMY N/A 07/02/2023   Procedure: LAPAROSCOPIC CHOLECYSTECTOMY;  Surgeon: Ebbie Cough, MD;  Location: [facility] OR;  Service: General;  Laterality: N/A;   ESOPHAGOGASTRODUODENOSCOPY N/A 01/31/2024   Procedure: EGD (ESOPHAGOGASTRODUODENOSCOPY);  Surgeon: San Sandor GAILS, DO;  Location: [facility] ENDOSCOPY;  Service: Gastroenterology;  Laterality: N/A;   INCISION AND DRAINAGE ABSCESS Left 09/28/2019   Procedure: INCISION AND DRAINAGE VULVAR ABCESS;  Surgeon: Edsel Norleen GAILS, MD;  Location: [facility] OR;  Service: Gynecology;  Laterality: Left;   INCISION AND DRAINAGE ABSCESS Right 02/23/2024   Procedure: INCISION AND DRAINAGE, ABSCESS;  Surgeon: Tobie Eldora NOVAK, MD;  Location: [facility] OR;  Service: ENT;  Laterality: Right;   INCISION AND DRAINAGE PERIRECTAL ABSCESS Right 08/18/2013   Procedure: IRRIGATION AND DEBRIDEMENT GLUTEAL ABSCESS;  Surgeon: Lynda Leos, MD;  Location: [facility] OR;  Service: General;  Laterality: Right;  INCISION AND DRAINAGE PERIRECTAL ABSCESS Right 09/19/2013   Procedure: IRRIGATION AND DEBRIDEMENT RIGHT GLUTEAL AND LABIAL ABSCESSES;  Surgeon: Lynda Leos, MD;  Location: [facility] OR;  Service: General;  Laterality: Right;   INCISION AND DRAINAGE PERIRECTAL ABSCESS Right 09/24/2013   Procedure: IRRIGATION AND DEBRIDEMENT PERIRECTAL ABSCESS;   Surgeon: Lynwood MALVA Pina, MD;  Location: [facility] OR;  Service: General;  Laterality: Right;   IR PARACENTESIS  08/28/2023   IR PARACENTESIS  11/04/2023   IR PARACENTESIS  12/23/2023   IR PARACENTESIS  02/04/2024   IR PARACENTESIS  03/23/2024   IR PARACENTESIS  04/06/2024     reports that she has never smoked. She has never been exposed to tobacco smoke. She has never used smokeless tobacco. She reports that she does not currently use alcohol . She reports that she does not use drugs.  Allergies[1]  Family History  Problem Relation Age of Onset   Asthma Mother    Carpal tunnel syndrome Mother    Gout Father    Diabetes Paternal Grandmother    Anesthesia problems Neg Hx     Prior to Admission medications  Medication Sig Start Date End Date Taking? Authorizing Provider  albuterol  (VENTOLIN  HFA) 108 (90 Base) MCG/ACT inhaler Inhale 2 puffs into the lungs every 4 (four) hours as needed for wheezing or shortness of breath. 11/24/22   [provider]  bumetanide  (BUMEX ) 2 MG tablet Take 10 mg by mouth daily.    [provider]  carvedilol  (COREG ) 25 MG tablet Take 1 tablet (25 mg total) by mouth 2 (two) times daily with a meal. Follow with your PCP for refills. 04/18/24 05/18/24  Perri DELENA Meliton Mickey., MD  Cinnamon 500 MG capsule Take 500 mg by mouth in the morning.    [provider]  diazePAM , 15 MG Dose, 2 x 7.5 MG/0.1ML LQPK Place 15 mg into the nose as needed (sz lasting over 2 minutes). 04/28/24   Yadav, Priyanka O, MD  DULoxetine  (CYMBALTA ) 20 MG capsule Take 1 capsule (20 mg total) by mouth daily. 05/04/24   Ghimire, Donalda HERO, MD  Elderberry-Vitamin C-Zinc  (ELDERBERRY IMMUNE HEALTH GUMMY PO) Take 2 each by mouth in the morning.    [provider]  fluticasone  (FLONASE ) 50 MCG/ACT nasal spray Place 2 sprays into both nostrils daily as needed for allergies or rhinitis. 12/11/23 12/10/24  [provider]  hydrOXYzine  (ATARAX ) 25 MG tablet Take 1 tablet (25 mg  total) by mouth 3 (three) times daily as needed for anxiety, itching, nausea or vomiting. 05/04/24   Ghimire, Donalda HERO, MD  lacosamide  (VIMPAT ) 50 MG TABS tablet Take 2 tablets (100 mg total) by mouth 2 (two) times daily. Take additional 1 tablet (50 mg) after dialysis on Tuesdays, Thursdays and Saturdays 05/04/24   Raenelle Donalda HERO, MD  LANTUS  SOLOSTAR 100 UNIT/ML Solostar Pen Inject 5 Units into the skin daily. 04/18/24   Perri DELENA Meliton Mickey., MD  NOVOLOG  FLEXPEN 100 UNIT/ML FlexPen Before each meal 3 times a day, 140-199 - 2 units, 200-250 - 4 units, 251-299 - 6 units,  300-349 - 7 units,  350 or above 8 units. 03/30/24   Singh, Prashant K, MD  OLANZapine  (ZYPREXA ) 5 MG tablet Take 1 tablet (5 mg total) by mouth at bedtime. 05/04/24   Ghimire, Donalda HERO, MD  Pancrelipase , Lip-Prot-Amyl, 3000-9500 units CPEP Take 3,000 Units by mouth in the morning, at noon, and at bedtime.    [provider]  pantoprazole  (PROTONIX ) 40 MG tablet Take  1 tablet (40 mg total) by mouth 2 (two) times daily. 02/07/24 04/10/24  Drusilla Sabas RAMAN, MD  prochlorperazine  (COMPAZINE ) 5 MG tablet Take 5 mg by mouth every 6 (six) hours as needed for nausea or vomiting.    [provider]  [Paused] rosuvastatin  (CRESTOR ) 40 MG tablet Take 40 mg by mouth daily. Wait to take this until your doctor or other care provider tells you to start again. 01/20/24 01/19/25  [provider]  sevelamer  carbonate (RENVELA ) 800 MG tablet Take 1 tablet (800 mg total) by mouth 3 (three) times daily with meals. 02/07/24   Drusilla Sabas RAMAN, MD  sodium bicarbonate  650 MG tablet Take 650 mg by mouth 2 (two) times daily. Patient not taking: Reported on 04/07/2024 07/29/23   [provider]  SUMAtriptan  (IMITREX ) 50 MG tablet Take 50 mg by mouth every 2 (two) hours as needed for migraine or headache. 06/20/22   [provider]  VELTASSA  16.8 g PACK Take 1 Dose by mouth daily at 12 noon. 04/03/24   [provider]     Physical Exam: Constitutional: Moderately built and nourished. Vitals:   05/05/24 2139 05/06/24 0024 05/06/24 0138  BP: (!) 185/94 (!) 159/81 (!) 153/79  Pulse: 87 98 89  Resp: 18 (!) 21 20  Temp: 98.5 F (36.9 C) 98.4 F (36.9 C) 98.5 F (36.9 C)  TempSrc: Oral Oral Oral  SpO2: 100% 100% 99%   Eyes: Anicteric no pallor. ENMT: Mild facial swelling no discharge. Neck: No neck rigidity. Respiratory: No rhonchi or crepitations. Cardiovascular: S1-S2 heard. Abdomen: Soft nontender bowel sound present. Musculoskeletal: No edema. Skin: No rash. Neurologic: Alert awake oriented to time place and person.  Moves all extremities. Psychiatric: Appears normal.  Normal affect.   Labs on Admission: I have personally reviewed following labs and imaging studies  CBC: Recent Labs  Lab 04/30/24 2028 04/30/24 2134 04/30/24 2135 05/01/24 0443 05/02/24 0639 05/03/24 0521 05/05/24 2146  WBC 7.5  --   --  7.7 14.5* 11.2* 10.0  NEUTROABS 3.8  --   --  3.7  --   --  6.2  HGB 9.5*   < > 11.9* 9.2* 12.0 9.7* 9.3*  HCT 35.8*   < > 35.0* 31.7* 35.2* 28.9* 30.6*  MCV 100.0  --   --  91.6 76.9* 78.3* 87.4  PLT 284  --   --  282 247 205 195   < > = values in this interval not displayed.   Basic Metabolic Panel: Recent Labs  Lab 05/01/24 0443 05/01/24 0905 05/01/24 1943 05/02/24 0220 05/02/24 0435 05/02/24 1605 05/02/24 2114 05/03/24 0521 05/04/24 0352 05/05/24 2146  NA 120*   < > 129* 128*   < > 127* 130* 132* 133* 131*  K 4.0   < > 3.1* 3.7   < > 3.7 3.3* 3.6 3.8 4.4  CL 83*   < > 93* 93*   < > 94* 93* 96* 95* 94*  CO2 11*   < > 18* 20*   < > 19* 22 23 23  14*  GLUCOSE >1,200*   < > 183* 166*   < > 111* 235* 54* 315* 826*  BUN 40*   < > 39* 30*   < > 32* 34* 34* 22* 36*  CREATININE 5.19*   < > 5.01* 4.37*   < > 4.66* 4.85* 5.14* 3.69* 5.09*  CALCIUM  8.6*   < > 8.3* 8.4*   < > 8.2* 8.3* 8.3* 8.5* 8.7*  MG  2.1  --  1.8 1.9  --   --   --   --   --   --   PHOS 6.2*  --   --   --    --   --   --  4.9* 3.3  --    < > = values in this interval not displayed.   GFR: Estimated Creatinine Clearance: 13.2 mL/min (A) (by C-G formula based on SCr of 5.09 mg/dL (H)). Liver Function Tests: Recent Labs  Lab 05/03/24 0521 05/04/24 0352 05/05/24 2146  AST  --   --  190*  ALT  --   --  65*  ALKPHOS  --   --  555*  BILITOT  --   --  0.4  PROT  --   --  5.9*  ALBUMIN  2.7* 3.4* 3.6   No results for input(s): LIPASE, AMYLASE in the last 168 hours. No results for input(s): AMMONIA in the last 168 hours. Coagulation Profile: No results for input(s): INR, PROTIME in the last 168 hours. Cardiac Enzymes: No results for input(s): CKTOTAL, CKMB, CKMBINDEX, TROPONINI in the last 168 hours. BNP (last 3 results) Recent Labs    05/01/24 0443  PROBNP >35,000.0*   HbA1C: No results for input(s): HGBA1C in the last 72 hours. CBG: Recent Labs  Lab 05/06/24 0035 05/06/24 0106 05/06/24 0136 05/06/24 0212 05/06/24 0246  GLUCAP >600* >600* >600* >600* >600*   Lipid Profile: No results for input(s): CHOL, HDL, LDLCALC, TRIG, CHOLHDL, LDLDIRECT in the last 72 hours. Thyroid  Function Tests: No results for input(s): TSH, T4TOTAL, FREET4, T3FREE, THYROIDAB in the last 72 hours. Anemia Panel: No results for input(s): VITAMINB12, FOLATE, FERRITIN, TIBC, IRON , RETICCTPCT in the last 72 hours. Urine analysis:    Component Value Date/Time   COLORURINE YELLOW 04/13/2024 1749   APPEARANCEUR HAZY (A) 04/13/2024 1749   APPEARANCEUR Hazy 11/10/2013 2043   LABSPEC 1.018 04/13/2024 1749   LABSPEC 1.031 11/10/2013 2043   PHURINE 7.0 04/13/2024 1749   GLUCOSEU >=500 (A) 04/13/2024 1749   GLUCOSEU >=500 11/10/2013 2043   HGBUR NEGATIVE 04/13/2024 1749   BILIRUBINUR NEGATIVE 04/13/2024 1749   BILIRUBINUR negative 06/04/2018 1035   BILIRUBINUR Negative 11/24/2015 1443   BILIRUBINUR Negative 11/10/2013 2043   KETONESUR 5 (A) 04/13/2024  1749   PROTEINUR >=300 (A) 04/13/2024 1749   UROBILINOGEN 0.2 09/14/2019 1732   NITRITE NEGATIVE 04/13/2024 1749   LEUKOCYTESUR NEGATIVE 04/13/2024 1749   LEUKOCYTESUR 1+ 11/10/2013 2043   Sepsis Labs: @LABRCNTIP (procalcitonin:4,lacticidven:4) )No results found for this or any previous visit (from the past 240 hours).   Radiological Exams on Admission: DG Chest Port 1 View Result Date: 05/06/2024 CLINICAL DATA:  Cough EXAM: PORTABLE CHEST 1 VIEW COMPARISON:  04/30/2024, 04/24/2024 FINDINGS: Cardiomegaly. No focal airspace disease, pleural effusion or pneumothorax. Decreased vascular congestion and interstitial edema compared to prior. IMPRESSION: Cardiomegaly with decreased vascular congestion and interstitial edema compared to prior. Electronically Signed   By: Luke Bun M.D.   On: 05/06/2024 00:18    EKG: Independently reviewed.  Normal sinus rhythm.  Assessment/Plan Active Problems:   DKA, type 1 (HCC)   ESRD (end stage renal disease) (HCC)   HFrEF (heart failure with reduced ejection fraction) (HCC)   Seizure disorder (HCC)   Bipolar disorder (HCC)   Normocytic anemia    DKA type I diabetic has had history of recurrent DKA recently with brittle diabetes.  Last hemoglobin A1c was 8.5.  Patient started on DKA protocol with IV insulin  infusion.  Follow metabolic panel beta-hydroxybutyrate acid closely. Hypertension on beta-blockers.  Follow blood pressure trends. ESRD on hemodialysis on Tuesday Thursday Saturday.  Consult nephrology. History of bipolar disorder on Zyprexa  and Cymbalta . History of seizures on lacosamide . Chronic HFrEF last EF measured was 30 to 35%.  No longer on Entresto  since patient had some facial swelling concerning for angioedema.  Presently on beta-blockers.  Plan was to start patient on hydralazine /Imdur  as outpatient. Anemia secondary to ESRD follow CBC. Elevated LFTs has had prior history of elevated LFTs.  Follow LFTs.  Patient does have abdominal  discomfort.  Will check KUB.  Since patient has DKA will need close monitoring further workup and more than 2 midnight stay.  DVT prophylaxis: Heparin . Code Status: Full code. Family Communication: Cussed with patient. Disposition Plan: Monitored bed. Consults called: Please consult nephrology for dialysis. Admission status: Inpatient.         [1]  Allergies Allergen Reactions   Keflex  [Cephalexin ] Anaphylaxis    Ceftriaxone  in the past with no reaction   Penicillins Anaphylaxis, Hives and Rash   Vibramycin  [Doxycycline ] Anaphylaxis   Benadryl  [Diphenhydramine ] Itching   Entresto  [Sacubitril -Valsartan ] Swelling    angioedema   Dilaudid  [Hydromorphone ] Itching   Methotrexate And Trimetrexate Rash   Roxicodone  [Oxycodone ] Itching    Takes Percocet without issue   "

## 2024-05-06 NOTE — Plan of Care (Signed)
   Problem: Education: Goal: Ability to describe self-care measures that may prevent or decrease complications (Diabetes Survival Skills Education) will improve Outcome: Progressing Goal: Individualized Educational Video(s) Outcome: Progressing   Problem: Coping: Goal: Ability to adjust to condition or change in health will improve Outcome: Progressing   Problem: Fluid Volume: Goal: Ability to maintain a balanced intake and output will improve Outcome: Progressing   Problem: Health Behavior/Discharge Planning: Goal: Ability to identify and utilize available resources and services will improve Outcome: Progressing Goal: Ability to manage health-related needs will improve Outcome: Progressing   Problem: Metabolic: Goal: Ability to maintain appropriate glucose levels will improve Outcome: Progressing   Problem: Nutritional: Goal: Maintenance of adequate nutrition will improve Outcome: Progressing Goal: Progress toward achieving an optimal weight will improve Outcome: Progressing   Problem: Skin Integrity: Goal: Risk for impaired skin integrity will decrease Outcome: Progressing   Problem: Tissue Perfusion: Goal: Adequacy of tissue perfusion will improve Outcome: Progressing   Problem: Education: Goal: Ability to describe self-care measures that may prevent or decrease complications (Diabetes Survival Skills Education) will improve Outcome: Progressing Goal: Individualized Educational Video(s) Outcome: Progressing   Problem: Cardiac: Goal: Ability to maintain an adequate cardiac output will improve Outcome: Progressing   Problem: Health Behavior/Discharge Planning: Goal: Ability to identify and utilize available resources and services will improve Outcome: Progressing Goal: Ability to manage health-related needs will improve Outcome: Progressing   Problem: Fluid Volume: Goal: Ability to achieve a balanced intake and output will improve Outcome: Progressing    Problem: Metabolic: Goal: Ability to maintain appropriate glucose levels will improve Outcome: Progressing   Problem: Nutritional: Goal: Maintenance of adequate nutrition will improve Outcome: Progressing Goal: Maintenance of adequate weight for body size and type will improve Outcome: Progressing   Problem: Respiratory: Goal: Will regain and/or maintain adequate ventilation Outcome: Progressing   Problem: Urinary Elimination: Goal: Ability to achieve and maintain adequate renal perfusion and functioning will improve Outcome: Progressing

## 2024-05-06 NOTE — Progress Notes (Signed)
 " PROGRESS NOTE  Ashley Freeman FMW:[id number] DOB: May 14, 1990   PCP: Keven Crumbly Pap, MD  Patient is from: Home.  DOA: 05/05/2024 LOS: 0  Chief complaints Chief Complaint  Patient presents with   Migraine     Brief Narrative / Interim history: 34 year old F with PMH of DM-1, ESRD on HD TTS, HFrEF, bipolar disorder and recent hospitalization for DKA returning with generalized body ache and not feeling well, and admitted with diabetic ketoacidosis.  Patient reports compliance with home insulin .  In ED, elevated BP to 185/94.  CBG > 600.  BMP glucose 826.  Bicarb 14.  AG 24.  AST 190.  ALT 65.  pH 7.24.  DHEA 5.80.  Pregnancy test negative.  CXR showed cardiomegaly with decreased vascular congestion and interstitial edema compared to prior.  Started on insulin  drip and IV fluid and admitted for further care.    Subjective: Seen and examined earlier this morning.  Complains of migraine headache, dry cough and generalized body pain.  Denies nausea, vomiting or abdominal pain.  Denies shortness of breath or UTI symptoms.   Assessment and plan: Poorly controlled DM-1 with diabetic ketoacidosis and level 2 hypoglycemia: Unclear cause of DKA.  She reports compliance with her insulin .  Still with anion gap metabolic acidosis likely from ESRD.  BHA has normalized. Recent Labs  Lab 05/06/24 0846 05/06/24 1011 05/06/24 1145 05/06/24 1206 05/06/24 1233  GLUCAP 188* 98 42* 38* 146*  -SSI-very sensitive -Basal insulin  5 units daily -Further adjustment as appropriate. -Discontinue IV fluid  ESRD on HD TTS: No emergent need for HD. -Nephrology consult  Chronic HFrEF: Appears euvolemic on exam.  No longer on Entresto  due to concern for angioedema. - Continue beta-blocker - Fluid management by dialysis.  Elevated LFT: Pattern consistent with alcohol  or rhabdo.  Denies alcohol . - Check CK  History of seizure: Stable - Continue home Vimpat .  History of bipolar disorder:  Stable - Continue home Zyprexa  and Cymbalta   Essential hypertension: BP improved. - Continue home Coreg   Pancreatic insufficiency - Continue home Creon   Migraine/myalgia/dry cough: - Check RVP and SARS. - As needed Tylenol  fentanyl    There is no height or weight on file to calculate BMI.           DVT prophylaxis:  heparin  injection 5,000 Units Start: 05/06/24 0600  Code Status: Full code Family Communication: None at bedside Level of care: Med-Surg Status is: Inpatient Remains inpatient appropriate because: DKA/hyperglycemia   Final disposition: Likely home once medically stable.   55 minutes with more than 50% spent in reviewing records, counseling patient/family and coordinating care.  Consultants:  None  Procedures: None  Microbiology summarized: RVP and COVID-19 pending  Objective: Vitals:   05/06/24 0625 05/06/24 0914 05/06/24 1132 05/06/24 1133  BP: (!) 140/82 119/70 91/61   Pulse: 84 78 73   Resp: 20 14 14    Temp: 98.1 F (36.7 C) 98 F (36.7 C)  98.1 F (36.7 C)  TempSrc: Oral   Oral  SpO2: 99% 100% 98%     Examination:  GENERAL: No apparent distress.  Nontoxic. HEENT: MMM.  Vision and hearing grossly intact.  NECK: Supple.  No apparent JVD.  RESP:  No IWOB.  Fair aeration bilaterally. CVS:  RRR. Heart sounds normal.  ABD/GI/GU: BS+. Abd soft, NTND.  MSK/EXT:  Moves extremities. No apparent deformity. No edema.  SKIN: no apparent skin lesion or wound NEURO: Sleepy but wakes to voice.  Oriented appropriately.  No apparent focal neuro deficit.  PSYCH: Calm. Normal affect.   Sch Meds:  Scheduled Meds:  carvedilol   25 mg Oral BID WC   DULoxetine   20 mg Oral Daily   heparin   5,000 Units Subcutaneous Q8H   insulin  aspart  0-6 Units Subcutaneous TID WC   insulin  aspart  2 Units Subcutaneous TID WC   insulin  glargine-yfgn  10 Units Subcutaneous Q24H   lacosamide   100 mg Oral BID   [START ON 05/07/2024] lacosamide   50 mg Oral Q  T,Th,Sat-1800   lipase/protease/amylase  12,000 Units Oral TID WC   OLANZapine   5 mg Oral QHS   Continuous Infusions:  dextrose  5% lactated ringers  75 mL/hr at 05/06/24 0908   insulin  Stopped (05/06/24 1027)   lactated ringers  Stopped (05/06/24 0906)   PRN Meds:.acetaminophen , dextrose , fentaNYL  (SUBLIMAZE ) injection, hydrOXYzine   Antimicrobials: Anti-infectives (From admission, onward)    None        I have personally reviewed the following labs and images: CBC: Recent Labs  Lab 04/30/24 2028 04/30/24 2134 05/01/24 0443 05/02/24 0639 05/03/24 0521 05/05/24 2146 05/06/24 0256  WBC 7.5  --  7.7 14.5* 11.2* 10.0 11.1*  NEUTROABS 3.8  --  3.7  --   --  6.2  --   HGB 9.5*   < > 9.2* 12.0 9.7* 9.3* 8.5*  HCT 35.8*   < > 31.7* 35.2* 28.9* 30.6* 27.7*  MCV 100.0  --  91.6 76.9* 78.3* 87.4 86.3  PLT 284  --  282 247 205 195 182   < > = values in this interval not displayed.   BMP &GFR Recent Labs  Lab 05/01/24 0443 05/01/24 0905 05/01/24 1943 05/02/24 0220 05/02/24 0435 05/03/24 0521 05/04/24 0352 05/05/24 2146 05/06/24 0256 05/06/24 0649  NA 120*   < > 129* 128*   < > 132* 133* 131* 131* 136  K 4.0   < > 3.1* 3.7   < > 3.6 3.8 4.4 4.1 3.7  CL 83*   < > 93* 93*   < > 96* 95* 94* 96* 98  CO2 11*   < > 18* 20*   < > 23 23 14* 16* 16*  GLUCOSE >1,200*   < > 183* 166*   < > 54* 315* 826* 744* 383*  BUN 40*   < > 39* 30*   < > 34* 22* 36* 37* 35*  CREATININE 5.19*   < > 5.01* 4.37*   < > 5.14* 3.69* 5.09* 5.18* 5.48*  CALCIUM  8.6*   < > 8.3* 8.4*   < > 8.3* 8.5* 8.7* 8.1* 8.4*  MG 2.1  --  1.8 1.9  --   --   --   --   --   --   PHOS 6.2*  --   --   --   --  4.9* 3.3  --   --   --    < > = values in this interval not displayed.   Estimated Creatinine Clearance: 12.3 mL/min (A) (by C-G formula based on SCr of 5.48 mg/dL (H)). Liver & Pancreas: Recent Labs  Lab 05/03/24 0521 05/04/24 0352 05/05/24 2146  AST  --   --  190*  ALT  --   --  65*  ALKPHOS  --   --   555*  BILITOT  --   --  0.4  PROT  --   --  5.9*  ALBUMIN  2.7* 3.4* 3.6   No results for input(s): LIPASE, AMYLASE in the last 168 hours. No results  for input(s): AMMONIA in the last 168 hours. Diabetic: No results for input(s): HGBA1C in the last 72 hours. Recent Labs  Lab 05/06/24 0740 05/06/24 0846 05/06/24 1011 05/06/24 1145 05/06/24 1206  GLUCAP 252* 188* 98 42* 38*   Cardiac Enzymes: No results for input(s): CKTOTAL, CKMB, CKMBINDEX, TROPONINI in the last 168 hours. Recent Labs    05/01/24 0443  PROBNP >35,000.0*   Coagulation Profile: No results for input(s): INR, PROTIME in the last 168 hours. Thyroid  Function Tests: No results for input(s): TSH, T4TOTAL, FREET4, T3FREE, THYROIDAB in the last 72 hours. Lipid Profile: No results for input(s): CHOL, HDL, LDLCALC, TRIG, CHOLHDL, LDLDIRECT in the last 72 hours. Anemia Panel: No results for input(s): VITAMINB12, FOLATE, FERRITIN, TIBC, IRON , RETICCTPCT in the last 72 hours. Urine analysis:    Component Value Date/Time   COLORURINE YELLOW 04/13/2024 1749   APPEARANCEUR HAZY (A) 04/13/2024 1749   APPEARANCEUR Hazy 11/10/2013 2043   LABSPEC 1.018 04/13/2024 1749   LABSPEC 1.031 11/10/2013 2043   PHURINE 7.0 04/13/2024 1749   GLUCOSEU >=500 (A) 04/13/2024 1749   GLUCOSEU >=500 11/10/2013 2043   HGBUR NEGATIVE 04/13/2024 1749   BILIRUBINUR NEGATIVE 04/13/2024 1749   BILIRUBINUR negative 06/04/2018 1035   BILIRUBINUR Negative 11/24/2015 1443   BILIRUBINUR Negative 11/10/2013 2043   KETONESUR 5 (A) 04/13/2024 1749   PROTEINUR >=300 (A) 04/13/2024 1749   UROBILINOGEN 0.2 09/14/2019 1732   NITRITE NEGATIVE 04/13/2024 1749   LEUKOCYTESUR NEGATIVE 04/13/2024 1749   LEUKOCYTESUR 1+ 11/10/2013 2043   Sepsis Labs: Invalid input(s): PROCALCITONIN, LACTICIDVEN  Microbiology: No results found for this or any previous visit (from the past 240 hours).  Radiology  Studies: DG Abd 1 View Result Date: 05/06/2024 CLINICAL DATA:  Abdominal pain EXAM: ABDOMEN - 1 VIEW COMPARISON:  04/23/2024 FINDINGS: Mild gaseous distention of the stomach. No gaseous small bowel dilatation to suggest obstruction. Gas is visible in a nondilated splenic flexure of the colon. Surgical clips in the right upper quadrant suggest prior cholecystectomy. IMPRESSION: Mild gaseous distention of the stomach. No gaseous small bowel dilatation to suggest obstruction. Electronically Signed   By: Camellia Candle M.D.   On: 05/06/2024 06:53   DG Chest Port 1 View Result Date: 05/06/2024 CLINICAL DATA:  Cough EXAM: PORTABLE CHEST 1 VIEW COMPARISON:  04/30/2024, 04/24/2024 FINDINGS: Cardiomegaly. No focal airspace disease, pleural effusion or pneumothorax. Decreased vascular congestion and interstitial edema compared to prior. IMPRESSION: Cardiomegaly with decreased vascular congestion and interstitial edema compared to prior. Electronically Signed   By: Luke Bun M.D.   On: 05/06/2024 00:18      Emil Klassen T. Tyan Dy [facility]  Hospitalist  If 7PM-7AM, please contact night-coverage [email or web address] 05/06/2024, 12:28 PM   "

## 2024-05-06 NOTE — ED Notes (Signed)
 CBG obtained 15 min after still critically low RN was notified

## 2024-05-06 NOTE — ED Notes (Signed)
 [facility] called.

## 2024-05-06 NOTE — ED Notes (Signed)
 CBG obtained of 42, patient was provided with orange juice and ginger ale which were both consumed witness by myself at bedside 1149

## 2024-05-06 NOTE — ED Notes (Signed)
 NT notified RN of PT's HIGH CBG reading.

## 2024-05-06 NOTE — ED Notes (Signed)
 Gave pt sandwich per md request

## 2024-05-07 ENCOUNTER — Inpatient Hospital Stay (HOSPITAL_COMMUNITY)

## 2024-05-07 ENCOUNTER — Other Ambulatory Visit (HOSPITAL_COMMUNITY): Payer: Self-pay

## 2024-05-07 DIAGNOSIS — E1010 Type 1 diabetes mellitus with ketoacidosis without coma: Secondary | ICD-10-CM | POA: Diagnosis not present

## 2024-05-07 LAB — RENAL FUNCTION PANEL
Albumin: 3.2 g/dL — ABNORMAL LOW (ref 3.5–5.0)
Anion gap: 13 (ref 5–15)
BUN: 37 mg/dL — ABNORMAL HIGH (ref 6–20)
CO2: 21 mmol/L — ABNORMAL LOW (ref 22–32)
Calcium: 8.5 mg/dL — ABNORMAL LOW (ref 8.9–10.3)
Chloride: 102 mmol/L (ref 98–111)
Creatinine, Ser: 5.49 mg/dL — ABNORMAL HIGH (ref 0.44–1.00)
GFR, Estimated: 10 mL/min — ABNORMAL LOW
Glucose, Bld: 27 mg/dL — CL (ref 70–99)
Phosphorus: 4.5 mg/dL (ref 2.5–4.6)
Potassium: 4.2 mmol/L (ref 3.5–5.1)
Sodium: 135 mmol/L (ref 135–145)

## 2024-05-07 LAB — CBC
HCT: 34.1 % — ABNORMAL LOW (ref 36.0–46.0)
Hemoglobin: 10.7 g/dL — ABNORMAL LOW (ref 12.0–15.0)
MCH: 26 pg (ref 26.0–34.0)
MCHC: 31.4 g/dL (ref 30.0–36.0)
MCV: 82.8 fL (ref 80.0–100.0)
Platelets: 331 K/uL (ref 150–400)
RBC: 4.12 MIL/uL (ref 3.87–5.11)
RDW: 14.6 % (ref 11.5–15.5)
WBC: 16 K/uL — ABNORMAL HIGH (ref 4.0–10.5)
nRBC: 0.2 % (ref 0.0–0.2)

## 2024-05-07 LAB — GLUCOSE, CAPILLARY
Glucose-Capillary: 111 mg/dL — ABNORMAL HIGH (ref 70–99)
Glucose-Capillary: 24 mg/dL — CL (ref 70–99)
Glucose-Capillary: 111 mg/dL — ABNORMAL HIGH (ref 70–99)
Glucose-Capillary: 150 mg/dL — ABNORMAL HIGH (ref 70–99)
Glucose-Capillary: 48 mg/dL — ABNORMAL LOW (ref 70–99)
Glucose-Capillary: 56 mg/dL — ABNORMAL LOW (ref 70–99)
Glucose-Capillary: 64 mg/dL — ABNORMAL LOW (ref 70–99)
Glucose-Capillary: 22 mg/dL — CL (ref 70–99)
Glucose-Capillary: 32 mg/dL — CL (ref 70–99)
Glucose-Capillary: 125 mg/dL — ABNORMAL HIGH (ref 70–99)
Glucose-Capillary: 93 mg/dL (ref 70–99)
Glucose-Capillary: 73 mg/dL (ref 70–99)
Glucose-Capillary: 83 mg/dL (ref 70–99)
Glucose-Capillary: 80 mg/dL (ref 70–99)
Glucose-Capillary: 89 mg/dL (ref 70–99)
Glucose-Capillary: 95 mg/dL (ref 70–99)
Glucose-Capillary: 97 mg/dL (ref 70–99)

## 2024-05-07 LAB — LACTIC ACID, PLASMA: Lactic Acid, Venous: 1.5 mmol/L (ref 0.5–1.9)

## 2024-05-07 LAB — PROTIME-INR
INR: 1.2 (ref 0.8–1.2)
Prothrombin Time: 16.2 s — ABNORMAL HIGH (ref 11.4–15.2)

## 2024-05-07 LAB — MAGNESIUM: Magnesium: 2.3 mg/dL (ref 1.7–2.4)

## 2024-05-07 LAB — APTT: aPTT: 25 s (ref 24–36)

## 2024-05-07 LAB — HEPATITIS B SURFACE ANTIGEN: Hepatitis B Surface Ag: NONREACTIVE

## 2024-05-07 MED ORDER — PROCHLORPERAZINE EDISYLATE 10 MG/2ML IJ SOLN: 5.0000 mg | Freq: Four times a day (QID) | INTRAMUSCULAR | Status: DC | PRN

## 2024-05-07 MED ORDER — GLUCOSE 40 % PO GEL
1.0000 | ORAL | Status: AC
  Administered 2024-05-07: 31 g via ORAL
  Filled 2024-05-07: qty 1.21

## 2024-05-07 MED ORDER — SODIUM CHLORIDE 0.9 % IV SOLN: 2.0000 g | Freq: Once | INTRAVENOUS | Status: DC

## 2024-05-07 MED ORDER — SODIUM CHLORIDE 0.9 % IV SOLN
100.0000 mg | Freq: Two times a day (BID) | INTRAVENOUS | Status: DC
  Administered 2024-05-07: 100 mg via INTRAVENOUS
  Filled 2024-05-07 (×4): qty 10

## 2024-05-07 MED ORDER — VANCOMYCIN HCL 1250 MG/250ML IV SOLN
1250.0000 mg | Freq: Once | INTRAVENOUS | Status: AC
  Administered 2024-05-08: 1250 mg via INTRAVENOUS
  Filled 2024-05-07 (×2): qty 250

## 2024-05-07 MED ORDER — METRONIDAZOLE 500 MG/100ML IV SOLN
500.0000 mg | Freq: Two times a day (BID) | INTRAVENOUS | Status: DC
  Administered 2024-05-07 – 2024-05-08 (×3): 500 mg via INTRAVENOUS
  Filled 2024-05-07 (×3): qty 100

## 2024-05-07 MED ORDER — SODIUM CHLORIDE 0.9 % IV SOLN
1.0000 g | INTRAVENOUS | Status: DC
  Administered 2024-05-08 (×2): 1 g via INTRAVENOUS
  Filled 2024-05-07 (×3): qty 10

## 2024-05-07 MED ORDER — SODIUM CHLORIDE 0.9 % IV SOLN: 50.0000 mg | INTRAVENOUS | Status: DC

## 2024-05-07 MED ORDER — VANCOMYCIN HCL 750 MG/150ML IV SOLN
750.0000 mg | INTRAVENOUS | Status: DC
  Administered 2024-05-08: 750 mg via INTRAVENOUS

## 2024-05-07 MED ORDER — DEXTROSE 50 % IV SOLN: 12.5000 g | INTRAVENOUS | Status: AC

## 2024-05-07 MED ORDER — DEXTROSE-SODIUM CHLORIDE 5-0.45 % IV SOLN: INTRAVENOUS | Status: DC

## 2024-05-07 MED ORDER — CHLORHEXIDINE GLUCONATE CLOTH 2 % EX PADS
6.0000 | MEDICATED_PAD | Freq: Every day | CUTANEOUS | Status: DC
  Administered 2024-05-07 – 2024-05-10 (×3): 6 via TOPICAL

## 2024-05-07 MED ORDER — DEXTROSE 5 % IV SOLN: INTRAVENOUS | Status: DC

## 2024-05-07 NOTE — Progress Notes (Signed)
 Pt receives out-pt HD at [facility] on TTS 11:00 am chair time. Will assist as needed.   Randine Mungo Dialysis Navigator [phone]

## 2024-05-07 NOTE — Hospital Course (Signed)
 34 year old F with PMH of DM-1, ESRD on HD TTS, HFrEF, bipolar disorder and recent hospitalization for DKA returning with generalized body ache and not feeling well, and admitted with diabetic ketoacidosis.  Patient reports compliance with home insulin .  In ED, elevated BP to 185/94.  CBG > 600.  BMP glucose 826.  Bicarb 14.  AG 24.  AST 190.  ALT 65.  pH 7.24.  DHEA 5.80.  Pregnancy test negative.  CXR showed cardiomegaly with decreased vascular congestion and interstitial edema compared to prior.  Started on insulin  drip and IV fluid and admitted for further care.

## 2024-05-07 NOTE — Progress Notes (Addendum)
" °  Dr. Charlton informed at 0631, CBG @0630  48. I going to give her 50% dextrose  50 ml. I also cannot get a temp on her and she is refusing a rectal temp. He replied at [phone], She's hypothermic due to hypoglycemia. I agree with the D50. You can just use regular blankets and reassess in a while   "

## 2024-05-07 NOTE — Progress Notes (Signed)
 Patient's temperature is 92.7 and  MD notified.

## 2024-05-07 NOTE — Progress Notes (Signed)
 Patient is refusing rectal temp and MD notified of refusal.

## 2024-05-07 NOTE — Consult Note (Addendum)
 Renal Service Consult Note [facility] Ashley JONETTA Fret, MD  Patient: Ashley Freeman Date: 05/07/2024 Requesting Physician: Dr. Cindy  Reason for Consult: ESRD pt w/ recurrent DKA HPI: The patient is a 34 y.o. year-old w/ PMH as below who presented to ED on January 6 complaining of migraine headache and bodyaches.  She had been taking her insulin  at home.  She had a recent admission for allergic reaction possible angioedema due to Entresto .  Also complained of abdominal discomfort.  No fever, chills, no N/V/D.  In ED x-rays show nothing acute.  Beta hydroxybutyrate was 5.6, blood glucose 826, creatinine 5, potassium 4.3, Hgb 9.3.  She was started on IV insulin  infusion on January 7.  Nephrology was notified of her admission last night overnight, we are asked to see for ESRD.   Pt seen in hospital room.  Patient is lethargic with her eyes halfway open, not responding to questions, this is her usual presentation mentally when she has DKA.  No history obtained   ROS - n/a    Past Medical History  Past Medical History:  Diagnosis Date   Abscess, gluteal, right 08/24/2013   Anemia 02/19/2012   Bartholin's gland abscess 09/19/2013   Bipolar disorder (HCC)    BV (bacterial vaginosis) 11/24/2015   Depression    Diabetes mellitus type I (HCC) 2001   Diagnosed at age 67 ; Type I   Diarrhea 05/30/2016   DKA (diabetic ketoacidoses) 08/19/2013   Also in 2018   ESRD (end stage renal disease) (HCC)    Gonorrhea 08/2011   Treated in 09/2011   HFrEF (heart failure with reduced ejection fraction) (HCC)    a. 2022 Echo: EF 40%; b. 10/2021 Echo: EF 55%; b. 07/2022 MV: No ischemia. EF 31%; c. 08/2022 Echo: EF 35%, mildly dil RV, sev TR.   History of trichomoniasis 05/31/2016   Hyperlipidemia 03/28/2016   Hypertension    NICM (nonischemic cardiomyopathy) (HCC)    Sepsis (HCC) 09/19/2013   Past Surgical History  Past Surgical History:  Procedure Laterality Date   A/V  FISTULAGRAM Right 06/17/2023   Procedure: A/V Fistulagram;  Surgeon: Marea Selinda GORMAN, MD;  Location: [facility] INVASIVE CV LAB;  Service: Cardiovascular;  Laterality: Right;   A/V SHUNT INTERVENTION N/A 09/25/2023   Procedure: A/V SHUNT INTERVENTION;  Surgeon: Pearline Norman GORMAN, MD;  Location: [facility] PV LAB;  Service: Cardiovascular;  Laterality: N/A;   AV FISTULA PLACEMENT Right 07/06/2022   Procedure: ARTERIOVENOUS GRAFT CREATION;  Surgeon: Gretta Lonni PARAS, MD;  Location: [facility] OR;  Service: Vascular;  Laterality: Right;   CESAREAN SECTION N/A 10/05/2019   Procedure: CESAREAN SECTION;  Surgeon: Izell Harari, MD;  Location: [facility] LD ORS;  Service: Obstetrics;  Laterality: N/A;   CHOLECYSTECTOMY N/A 07/02/2023   Procedure: LAPAROSCOPIC CHOLECYSTECTOMY;  Surgeon: Ebbie Cough, MD;  Location: [facility] OR;  Service: General;  Laterality: N/A;   ESOPHAGOGASTRODUODENOSCOPY N/A 01/31/2024   Procedure: EGD (ESOPHAGOGASTRODUODENOSCOPY);  Surgeon: San Sandor GAILS, DO;  Location: [facility] ENDOSCOPY;  Service: Gastroenterology;  Laterality: N/A;   INCISION AND DRAINAGE ABSCESS Left 09/28/2019   Procedure: INCISION AND DRAINAGE VULVAR ABCESS;  Surgeon: Edsel Norleen GAILS, MD;  Location: [facility] OR;  Service: Gynecology;  Laterality: Left;   INCISION AND DRAINAGE ABSCESS Right 02/23/2024   Procedure: INCISION AND DRAINAGE, ABSCESS;  Surgeon: Tobie Eldora NOVAK, MD;  Location: [facility] OR;  Service: ENT;  Laterality: Right;   INCISION AND DRAINAGE PERIRECTAL ABSCESS Right 08/18/2013   Procedure: IRRIGATION AND DEBRIDEMENT GLUTEAL ABSCESS;  Surgeon: Lynda Leos, MD;  Location: [facility] OR;  Service: General;  Laterality: Right;   INCISION AND DRAINAGE PERIRECTAL ABSCESS Right 09/19/2013   Procedure: IRRIGATION AND DEBRIDEMENT RIGHT GLUTEAL AND LABIAL ABSCESSES;  Surgeon: Lynda Leos, MD;  Location: [facility] OR;  Service: General;  Laterality: Right;   INCISION AND DRAINAGE PERIRECTAL ABSCESS Right 09/24/2013   Procedure: IRRIGATION AND  DEBRIDEMENT PERIRECTAL ABSCESS;  Surgeon: Lynwood MALVA Pina, MD;  Location: [facility] OR;  Service: General;  Laterality: Right;   IR PARACENTESIS  08/28/2023   IR PARACENTESIS  11/04/2023   IR PARACENTESIS  12/23/2023   IR PARACENTESIS  02/04/2024   IR PARACENTESIS  03/23/2024   IR PARACENTESIS  04/06/2024   Family History  Family History  Problem Relation Age of Onset   Asthma Mother    Carpal tunnel syndrome Mother    Gout Father    Diabetes Paternal Grandmother    Anesthesia problems Neg Hx    Social History  reports that she has never smoked. She has never been exposed to tobacco smoke. She has never used smokeless tobacco. She reports that she does not currently use alcohol . She reports that she does not use drugs. Allergies Allergies[1] Home medications Prior to Admission medications  Medication Sig Start Date End Date Taking? Authorizing Provider  albuterol  (VENTOLIN  HFA) 108 (90 Base) MCG/ACT inhaler Inhale 2 puffs into the lungs every 4 (four) hours as needed for wheezing or shortness of breath. 11/24/22  Yes [provider]  bumetanide  (BUMEX ) 2 MG tablet Take 10 mg by mouth daily.   Yes [provider]  carvedilol  (COREG ) 25 MG tablet Take 1 tablet (25 mg total) by mouth 2 (two) times daily with a meal. Follow with your PCP for refills. 04/18/24 05/18/24 Yes Perri DELENA Meliton Mickey., MD  Cinnamon 500 MG capsule Take 500 mg by mouth in the morning.   Yes [provider]  diazePAM , 15 MG Dose, 2 x 7.5 MG/0.1ML LQPK Place 15 mg into the nose as needed (sz lasting over 2 minutes). 04/28/24  Yes Yadav, Priyanka O, MD  DULoxetine  (CYMBALTA ) 20 MG capsule Take 1 capsule (20 mg total) by mouth daily. 05/04/24  Yes Ghimire, Donalda HERO, MD  Elderberry-Vitamin C-Zinc  (ELDERBERRY IMMUNE HEALTH GUMMY PO) Take 1 tablet by mouth daily.   Yes [provider]  fluticasone  (FLONASE ) 50 MCG/ACT nasal spray Place 2 sprays into both nostrils daily as needed for allergies or rhinitis.  12/11/23 12/10/24 Yes [provider]  hydrOXYzine  (ATARAX ) 25 MG tablet Take 1 tablet (25 mg total) by mouth 3 (three) times daily as needed for anxiety, itching, nausea or vomiting. 05/04/24  Yes Ghimire, Donalda HERO, MD  lacosamide  (VIMPAT ) 50 MG TABS tablet Take 2 tablets (100 mg total) by mouth 2 (two) times daily. Take additional 1 tablet (50 mg) after dialysis on Tuesdays, Thursdays and Saturdays 05/04/24  Yes Ghimire, Donalda HERO, MD  LANTUS  SOLOSTAR 100 UNIT/ML Solostar Pen Inject 5 Units into the skin daily. 04/18/24  Yes Perri DELENA Meliton Mickey., MD  NOVOLOG  FLEXPEN 100 UNIT/ML FlexPen Before each meal 3 times a day, 140-199 - 2 units, 200-250 - 4 units, 251-299 - 6 units,  300-349 - 7 units,  350 or above 8 units. Patient taking differently: Inject 5 Units into the skin 3 (three) times daily with meals. 03/30/24  Yes Singh, Prashant K, MD  Pancrelipase , Lip-Prot-Amyl, 3000-9500 units CPEP Take 3,000 Units by mouth in the morning, at noon, and at bedtime.   Yes  [provider]  pantoprazole  (PROTONIX ) 40 MG tablet Take 1 tablet (40 mg total) by mouth 2 (two) times daily. 02/07/24 05/06/24 Yes Drusilla Sabas RAMAN, MD  prochlorperazine  (COMPAZINE ) 5 MG tablet Take 5 mg by mouth every 6 (six) hours as needed for nausea or vomiting.   Yes [provider]  [Paused] rosuvastatin  (CRESTOR ) 40 MG tablet Take 40 mg by mouth daily. Wait to take this until your doctor or other care provider tells you to start again. 01/20/24 01/19/25 Yes [provider]  sevelamer  carbonate (RENVELA ) 800 MG tablet Take 1 tablet (800 mg total) by mouth 3 (three) times daily with meals. 02/07/24  Yes Drusilla Sabas RAMAN, MD  SUMAtriptan  (IMITREX ) 50 MG tablet Take 50 mg by mouth every 2 (two) hours as needed for migraine or headache. 06/20/22  Yes [provider]  VELTASSA  16.8 g PACK Take 1 packet by mouth daily at 12 noon. 04/03/24  Yes [provider]  lamoTRIgine  (LAMICTAL ) 25 MG tablet Take 25  mg by mouth daily. Patient not taking: Reported on 05/06/2024    [provider]  levETIRAcetam  (KEPPRA ) 250 MG tablet Take 250 mg by mouth 2 (two) times daily. Patient not taking: Reported on 05/06/2024    [provider]  OLANZapine  (ZYPREXA ) 5 MG tablet Take 1 tablet (5 mg total) by mouth at bedtime. Patient not taking: Reported on 05/06/2024 05/04/24   Raenelle Donalda HERO, MD  sacubitril -valsartan  (ENTRESTO ) 24-26 MG Take 1 tablet by mouth 2 (two) times daily. Patient not taking: Reported on 05/06/2024    [provider]  sodium bicarbonate  650 MG tablet Take 650 mg by mouth 2 (two) times daily. Patient not taking: Reported on 04/07/2024 07/29/23   [provider]     Vitals:   05/06/24 1946 05/07/24 0400 05/07/24 0735 05/07/24 0823  BP: 104/66 (!) 140/99 (!) 132/93   Pulse: 70  66   Resp: 17 18 16    Temp:   (!) 92.7 F (33.7 C) (!) 94.2 F (34.6 C)  TempSrc: Oral  Oral Rectal  SpO2: 100% 96% 99%   Weight:      Height:       Exam Gen not responding to questions, follows simple commands Room air throat clear and mildly moist No jvd or bruits Chest clear bilat to bases RRR no MRG Abd soft ntnd no mass, possible 1-2+ ascites +bs Ext no pitting edema LEs or UEs Neuro is as above    RUE AVG + bruit   Home bp meds: Coreg   Bumex  Entresto  (was supposed to be stopped last admit)   OP HD: TTS [facility] ([address] [facility]h  B400  57.5kg  2K bath  R AVG  Hep 1600  CXR 1/07 - resolved IS edema and vasc congestion c/t last   Assessment/ Plan:  # ESRD - on HD TTS in [place] - labs good, K+ 4.3, creat 5 - is on the list for HD, not sure when   # HTN - BP's stabe 130/80 - getting home coreg    # Volume - not grossly vol overloaded on exam - CXR clear  - is usually vol up more than this - prefer D5W for BS support associated w/ DKA protocols - avoid LR , NS , 1/2NS etc because ESRD pts are rarely vol depleted   # Anemia of  esrd - Hb 8-10 here, follow   # DKA  - per pmd   # AMS  - typical for her  when in DKA      Myer Fret  MD [facility] 05/07/2024, 1:14 PM  Recent Labs  Lab 05/04/24 0352 05/05/24 2146 05/06/24 0256 05/06/24 0649 05/07/24 0448  HGB  --  9.3* 8.5*  --  10.7*  ALBUMIN  3.4* 3.6  --   --  3.2*  CALCIUM  8.5* 8.7* 8.1* 8.4* 8.5*  PHOS 3.3  --   --   --  4.5  CREATININE 3.69* 5.09* 5.18* 5.48* 5.49*  K 3.8 4.4 4.1 3.7 4.2   Inpatient medications:  carvedilol   12.5 mg Oral BID WC   Chlorhexidine  Gluconate Cloth  6 each Topical Q0600   dextrose   12.5 g Intravenous STAT   DULoxetine   20 mg Oral Daily   heparin   5,000 Units Subcutaneous Q8H   insulin  aspart  0-6 Units Subcutaneous TID WC   insulin  glargine-yfgn  5 Units Subcutaneous Q24H   lacosamide   100 mg Oral BID   lacosamide   50 mg Oral Q T,Th,Sat-1800   lipase/protease/amylase  12,000 Units Oral TID WC   OLANZapine   5 mg Oral QHS     dextrose , fentaNYL  (SUBLIMAZE ) injection, hydrOXYzine , oxyCODONE -acetaminophen , prochlorperazine       [1]  Allergies Allergen Reactions   Keflex  [Cephalexin ] Anaphylaxis    Ceftriaxone  in the past with no reaction   Penicillins Anaphylaxis, Hives and Rash   Vibramycin  [Doxycycline ] Anaphylaxis   Benadryl  [Diphenhydramine ] Itching   Entresto  [Sacubitril -Valsartan ] Swelling    angioedema   Dilaudid  [Hydromorphone ] Itching   Methotrexate And Trimetrexate Rash   Roxicodone  [Oxycodone ] Itching    Takes Percocet without issue

## 2024-05-07 NOTE — Progress Notes (Signed)
 Pharmacy Antibiotic Note  Ashley Freeman is a 34 y.o. female admitted on 05/05/2024 with sepsis.  Pharmacy has been consulted for Vancomycin  dosing.  Plan: Vancomycin  1250 mg X 1 dose then 750 mg QHD TuThSa.  Height: 5' 2.99 (160 cm) Weight: 64.4 kg (141 lb 15.6 oz) IBW/kg (Calculated) : 52.38  Temp (24hrs), Avg:95.4 F (35.2 C), Min:92.7 F (33.7 C), Max:97.5 F (36.4 C)  Recent Labs  Lab 05/02/24 0639 05/02/24 1119 05/03/24 0521 05/04/24 0352 05/05/24 2146 05/06/24 0256 05/06/24 0649 05/07/24 0448  WBC 14.5*  --  11.2*  --  10.0 11.1*  --  16.0*  CREATININE 4.42*   < > 5.14* 3.69* 5.09* 5.18* 5.48* 5.49*   < > = values in this interval not displayed.    Estimated Creatinine Clearance: 13.4 mL/min (A) (by C-G formula based on SCr of 5.49 mg/dL (H)).    Allergies[1]  Antimicrobials this admission: Vancomycin  1/8 >> 1/15 Cefepime  1/8 >> 1/15 (has tolerated multiple doses on multiple admissions with no listed reaction) Metronidazole  1/8>>1/15  Thank you for allowing pharmacy to be a part of this patients care.  Larraine Brazier, PharmD Clinical Pharmacist 05/07/2024  6:49 PM **Pharmacist phone directory can now be found on [email or web address] ([email or web address]).  Listed under [facility].       [1]  Allergies Allergen Reactions   Keflex  [Cephalexin ] Anaphylaxis    Ceftriaxone  in the past with no reaction   Penicillins Anaphylaxis, Hives and Rash   Vibramycin  [Doxycycline ] Anaphylaxis   Benadryl  [Diphenhydramine ] Itching   Entresto  [Sacubitril -Valsartan ] Swelling    angioedema   Dilaudid  [Hydromorphone ] Itching   Methotrexate And Trimetrexate Rash   Roxicodone  [Oxycodone ] Itching    Takes Percocet without issue

## 2024-05-07 NOTE — Progress Notes (Signed)
 Patient arrived from 34M VSS alert and oriented x 2 CCMD notifed, CHG bath completed, Bed in lowest position with call light in reach current plan of care in progress

## 2024-05-07 NOTE — Plan of Care (Signed)
  Problem: Coping: Goal: Ability to adjust to condition or change in health will improve Outcome: Progressing   Problem: Health Behavior/Discharge Planning: Goal: Ability to identify and utilize available resources and services will improve Outcome: Progressing   Problem: Metabolic: Goal: Ability to maintain appropriate glucose levels will improve Outcome: Progressing

## 2024-05-07 NOTE — Progress Notes (Signed)
" °  Progress Note   Patient: Ashley Freeman FMW:[id number] DOB: 10/01/1990 DOA: 05/05/2024     1 DOS: the patient was seen and examined on 05/07/2024   Brief hospital course: 34 year old F with PMH of DM-1, ESRD on HD TTS, HFrEF, bipolar disorder and recent hospitalization for DKA returning with generalized body ache and not feeling well, and admitted with diabetic ketoacidosis.  Patient reports compliance with home insulin .  In ED, elevated BP to 185/94.  CBG > 600.  BMP glucose 826.  Bicarb 14.  AG 24.  AST 190.  ALT 65.  pH 7.24.  DHEA 5.80.  Pregnancy test negative.  CXR showed cardiomegaly with decreased vascular congestion and interstitial edema compared to prior.  Started on insulin  drip and IV fluid and admitted for further care.  Assessment and Plan: Poorly controlled DM-1 with diabetic ketoacidosis and level 2 hypoglycemia: Unclear cause of DKA.  She reports compliance with her insulin .  Still with anion gap metabolic acidosis likely from ESRD.  BHA has normalized. -glucose remains very labile with bouts of hypoglycemia, symptomatic -Glucose down to 20's this afternoon, improved with D50. Not eating. Will cont on D5W at 40cc/hr overnight with q1h CBG    ESRD on HD TTS: No emergent need for HD. -Nephrology following   Chronic HFrEF: Appears euvolemic on exam.  No longer on Entresto  due to concern for angioedema. - Continue beta-blocker - Fluid management by dialysis.   Elevated LFT: Pattern consistent with alcohol  or rhabdo.  Denies alcohol .   History of seizure: Stable - Continue home Vimpat . -Pt not able to take PO currently, will change to IV for now   History of bipolar disorder: Stable - Continue home Zyprexa  and Cymbalta    Essential hypertension: BP improved. - Continue home Coreg    Pancreatic insufficiency - Continue home Creon    Migraine/myalgia/dry cough: - RVP and SARS. - As needed Tylenol  fentanyl       Subjective: Unable to assess given  mentation  Physical Exam: Vitals:   05/07/24 1549 05/07/24 1622 05/07/24 1628 05/07/24 1700  BP:  (!) 149/108 124/76 123/71  Pulse:  72 66 64  Resp:  20 19 15   Temp: (!) 97.5 F (36.4 C)  (!) 95.3 F (35.2 C)   TempSrc: Axillary  Rectal   SpO2:  98% 98% 97%  Weight:      Height:       General exam: Awake, laying in bed, in nad Respiratory system: Normal respiratory effort, no wheezing Cardiovascular system: regular rate, s1, s2 Gastrointestinal system: Soft, nondistended, positive BS Central nervous system: CN2-12 grossly intact, strength intact Extremities: Perfused, no clubbing Skin: Normal skin turgor, no notable skin lesions seen Psychiatry: Unable to assess given mentation  Data Reviewed:  Labs reviewed: Na 135, K 4.2, Cr 5.49, WBC 16.0, Hgb 10.7, Plts 331  Family Communication: Pt in room, family not at bedside  Disposition: Status is: Inpatient Remains inpatient appropriate because: severity of illness  Planned Discharge Destination: Home     Author: Garnette Pelt, MD 05/07/2024 5:58 PM  For on call review [email or web address].  "

## 2024-05-08 DIAGNOSIS — E1010 Type 1 diabetes mellitus with ketoacidosis without coma: Secondary | ICD-10-CM | POA: Diagnosis not present

## 2024-05-08 LAB — COMPREHENSIVE METABOLIC PANEL WITH GFR
ALT: 41 U/L (ref 0–44)
AST: 59 U/L — ABNORMAL HIGH (ref 15–41)
Albumin: 3 g/dL — ABNORMAL LOW (ref 3.5–5.0)
Alkaline Phosphatase: 441 U/L — ABNORMAL HIGH (ref 38–126)
Anion gap: 13 (ref 5–15)
BUN: 40 mg/dL — ABNORMAL HIGH (ref 6–20)
CO2: 19 mmol/L — ABNORMAL LOW (ref 22–32)
Calcium: 8 mg/dL — ABNORMAL LOW (ref 8.9–10.3)
Chloride: 100 mmol/L (ref 98–111)
Creatinine, Ser: 5.93 mg/dL — ABNORMAL HIGH (ref 0.44–1.00)
GFR, Estimated: 9 mL/min — ABNORMAL LOW
Glucose, Bld: 91 mg/dL (ref 70–99)
Potassium: 4.7 mmol/L (ref 3.5–5.1)
Sodium: 132 mmol/L — ABNORMAL LOW (ref 135–145)
Total Bilirubin: 0.3 mg/dL (ref 0.0–1.2)
Total Protein: 4.7 g/dL — ABNORMAL LOW (ref 6.5–8.1)

## 2024-05-08 LAB — CBC WITH DIFFERENTIAL/PLATELET
Abs Immature Granulocytes: 0.03 K/uL (ref 0.00–0.07)
Basophils Absolute: 0.1 K/uL (ref 0.0–0.1)
Basophils Relative: 1 %
Eosinophils Absolute: 0.4 K/uL (ref 0.0–0.5)
Eosinophils Relative: 4 %
HCT: 26.7 % — ABNORMAL LOW (ref 36.0–46.0)
Hemoglobin: 8.3 g/dL — ABNORMAL LOW (ref 12.0–15.0)
Immature Granulocytes: 0 %
Lymphocytes Relative: 42 %
Lymphs Abs: 4 K/uL (ref 0.7–4.0)
MCH: 26.2 pg (ref 26.0–34.0)
MCHC: 31.1 g/dL (ref 30.0–36.0)
MCV: 84.2 fL (ref 80.0–100.0)
Monocytes Absolute: 0.6 K/uL (ref 0.1–1.0)
Monocytes Relative: 6 %
Neutro Abs: 4.6 K/uL (ref 1.7–7.7)
Neutrophils Relative %: 47 %
Platelets: 260 K/uL (ref 150–400)
RBC: 3.17 MIL/uL — ABNORMAL LOW (ref 3.87–5.11)
RDW: 14.7 % (ref 11.5–15.5)
WBC: 9.7 K/uL (ref 4.0–10.5)
nRBC: 0.2 % (ref 0.0–0.2)

## 2024-05-08 LAB — CBC
HCT: 30.8 % — ABNORMAL LOW (ref 36.0–46.0)
Hemoglobin: 9.5 g/dL — ABNORMAL LOW (ref 12.0–15.0)
MCH: 26.1 pg (ref 26.0–34.0)
MCHC: 30.8 g/dL (ref 30.0–36.0)
MCV: 84.6 fL (ref 80.0–100.0)
Platelets: 264 K/uL (ref 150–400)
RBC: 3.64 MIL/uL — ABNORMAL LOW (ref 3.87–5.11)
RDW: 14.7 % (ref 11.5–15.5)
WBC: 9.7 K/uL (ref 4.0–10.5)
nRBC: 0 % (ref 0.0–0.2)

## 2024-05-08 LAB — RENAL FUNCTION PANEL
Albumin: 3 g/dL — ABNORMAL LOW (ref 3.5–5.0)
Anion gap: 14 (ref 5–15)
BUN: 42 mg/dL — ABNORMAL HIGH (ref 6–20)
CO2: 18 mmol/L — ABNORMAL LOW (ref 22–32)
Calcium: 8.1 mg/dL — ABNORMAL LOW (ref 8.9–10.3)
Chloride: 98 mmol/L (ref 98–111)
Creatinine, Ser: 6.22 mg/dL — ABNORMAL HIGH (ref 0.44–1.00)
GFR, Estimated: 9 mL/min — ABNORMAL LOW
Glucose, Bld: 239 mg/dL — ABNORMAL HIGH (ref 70–99)
Phosphorus: 5.4 mg/dL — ABNORMAL HIGH (ref 2.5–4.6)
Potassium: 4.9 mmol/L (ref 3.5–5.1)
Sodium: 130 mmol/L — ABNORMAL LOW (ref 135–145)

## 2024-05-08 LAB — GLUCOSE, CAPILLARY
Glucose-Capillary: 136 mg/dL — ABNORMAL HIGH (ref 70–99)
Glucose-Capillary: 166 mg/dL — ABNORMAL HIGH (ref 70–99)
Glucose-Capillary: 178 mg/dL — ABNORMAL HIGH (ref 70–99)
Glucose-Capillary: 195 mg/dL — ABNORMAL HIGH (ref 70–99)
Glucose-Capillary: 93 mg/dL (ref 70–99)

## 2024-05-08 LAB — LACTIC ACID, PLASMA: Lactic Acid, Venous: 1.5 mmol/L (ref 0.5–1.9)

## 2024-05-08 LAB — HEPATITIS B SURFACE ANTIBODY, QUANTITATIVE: Hep B S AB Quant (Post): <3.5 m[IU]/mL — ABNORMAL LOW

## 2024-05-08 MED ORDER — HEPARIN SODIUM (PORCINE) 1000 UNIT/ML DIALYSIS
2500.0000 [IU] | Freq: Once | INTRAMUSCULAR | Status: AC
  Administered 2024-05-08: 2500 [IU] via INTRAVENOUS_CENTRAL

## 2024-05-08 MED ORDER — FENTANYL CITRATE (PF) 50 MCG/ML IJ SOSY
PREFILLED_SYRINGE | INTRAMUSCULAR | Status: AC
  Filled 2024-05-08: qty 1

## 2024-05-08 MED ORDER — HEPARIN SODIUM (PORCINE) 1000 UNIT/ML IJ SOLN
INTRAMUSCULAR | Status: AC
  Filled 2024-05-08: qty 4

## 2024-05-08 MED ORDER — LACOSAMIDE 50 MG PO TABS
100.0000 mg | ORAL_TABLET | Freq: Two times a day (BID) | ORAL | Status: DC
  Administered 2024-05-08 – 2024-05-10 (×4): 100 mg via ORAL
  Filled 2024-05-08 (×4): qty 2

## 2024-05-08 MED ORDER — VANCOMYCIN VARIABLE DOSE PER UNSTABLE RENAL FUNCTION (PHARMACIST DOSING): Status: DC

## 2024-05-08 MED ORDER — CHLORHEXIDINE GLUCONATE CLOTH 2 % EX PADS
6.0000 | MEDICATED_PAD | Freq: Every day | CUTANEOUS | Status: DC
  Administered 2024-05-10: 6 via TOPICAL

## 2024-05-08 MED ORDER — PENTAFLUOROPROP-TETRAFLUOROETH EX AERO: 1.0000 | INHALATION_SPRAY | CUTANEOUS | Status: DC | PRN

## 2024-05-08 MED ORDER — ANTICOAGULANT SODIUM CITRATE 4% (200MG/5ML) IV SOLN: 5.0000 mL | Status: DC | PRN

## 2024-05-08 MED ORDER — HEPARIN SODIUM (PORCINE) 1000 UNIT/ML DIALYSIS: 1000.0000 [IU] | INTRAMUSCULAR | Status: DC | PRN

## 2024-05-08 MED ORDER — VANCOMYCIN HCL 750 MG/150ML IV SOLN
INTRAVENOUS | Status: AC
  Filled 2024-05-08: qty 150

## 2024-05-08 MED ORDER — LIDOCAINE-PRILOCAINE 2.5-2.5 % EX CREA: 1.0000 | TOPICAL_CREAM | CUTANEOUS | Status: DC | PRN

## 2024-05-08 MED ORDER — ALTEPLASE 2 MG IJ SOLR: 2.0000 mg | Freq: Once | INTRAMUSCULAR | Status: DC | PRN

## 2024-05-08 MED ORDER — LIDOCAINE HCL (PF) 1 % IJ SOLN: 5.0000 mL | INTRAMUSCULAR | Status: DC | PRN

## 2024-05-08 MED ORDER — LACOSAMIDE 50 MG PO TABS
50.0000 mg | ORAL_TABLET | ORAL | Status: DC
  Administered 2024-05-09: 50 mg via ORAL
  Filled 2024-05-08: qty 1

## 2024-05-08 NOTE — Progress Notes (Signed)
" °   05/08/24 1237  Vitals  Temp 98.4 F (36.9 C)  Pulse Rate 77  Resp 17  BP (!) 170/86  SpO2 98 %  O2 Device Room Air  Weight 61.2 kg  Type of Weight Post-Dialysis  Oxygen Therapy  Patient Activity (if Appropriate) In bed  Pulse Oximetry Type Continuous  Oximetry Probe Site Changed No  Post Treatment  Dialyzer Clearance Lightly streaked  Hemodialysis Intake (mL) 0 mL  Liters Processed 78  Fluid Removed (mL) 3200 mL  Tolerated HD Treatment Yes  AVG/AVF Arterial Site Held (minutes) 10 minutes  AVG/AVF Venous Site Held (minutes) 10 minutes   Received patient in bed to unit.  Alert and oriented.  Informed consent signed and in chart.   TX duration:3.5  Patient tolerated well.  Transported back to the room  Alert, without acute distress.  Hand-off given to patient's nurse.   Access used: RUAG Access issues: no complications  Total UF removed: 3200 Medication(s) given: fentyl 25mcg iv x 1   Delon LITTIE [facility] "

## 2024-05-08 NOTE — Progress Notes (Signed)
 [facility] Progress Note  Subjective:  Pt seen in HD unit  No new c/o's   Vitals:   05/08/24 1130 05/08/24 1200 05/08/24 1230 05/08/24 1237  BP: (!) 169/83 (!) 163/81 (!) 167/79 (!) 170/86  Pulse: 87 79 78 77  Resp: 20 19 20 17   Temp:    98.4 F (36.9 C)  TempSrc:      SpO2: 98% 99% 99% 98%  Weight:    61.2 kg  Height:        Exam: Gen sluggish, but responding today Room air throat clear and mildly moist No jvd or bruits Chest clear bilat to bases RRR no MRG Abd soft ntnd no mass, 1-2+ ascites +bs Ext no pitting edema LEs or UEs Neuro is as above    RUE AVG + bruit    Home bp meds: Coreg   Bumex  Entresto  (was supposed to be stopped last admit)    OP HD: TTS [facility] ([address] [facility]h  B400  57.5kg  2K bath  R AVG  Hep 1600   CXR 1/07 - resolved IS edema and vasc congestion c/t last    Assessment/ Plan:   # ESRD - on HD TTS in [place] - HD today off schedule  - HD again Sat to get back on schedule     # HTN - BP's up 160/90 - getting home coreg      # Volume - +ascites on exam, min LE edema - up 3-5kg by wts - CXR clear  - follow     # Anemia of esrd - Hb 8-10 here, follow    # DKA  - per pmd    # AMS  - typical for her when in DKA - improving some today        Myer Fret MD  [facility] 05/08/2024, 1:31 PM  Recent Labs  Lab 05/07/24 0448 05/07/24 2357 05/08/24 0741  HGB 10.7* 9.5* 8.3*  ALBUMIN  3.2* 3.0* 3.0*  CALCIUM  8.5* 8.0* 8.1*  PHOS 4.5  --  5.4*  CREATININE 5.49* 5.93* 6.22*  K 4.2 4.7 4.9   No results for input(s): IRON , TIBC, FERRITIN in the last 168 hours. Inpatient medications:  carvedilol   12.5 mg Oral BID WC   Chlorhexidine  Gluconate Cloth  6 each Topical Q0600   DULoxetine   20 mg Oral Daily   heparin   5,000 Units Subcutaneous Q8H   insulin  aspart  0-6 Units Subcutaneous TID WC   insulin  glargine-yfgn  5 Units Subcutaneous Q24H   lipase/protease/amylase  12,000 Units Oral TID  WC   OLANZapine   5 mg Oral QHS    ceFEPime  (MAXIPIME ) IV 1 g (05/08/24 0008)   lacosamide  (VIMPAT ) IV 100 mg (05/07/24 2317)   [START ON 05/09/2024] lacosamide  (VIMPAT ) IV     metronidazole  500 mg (05/07/24 2106)   [START ON 05/09/2024] vancomycin  750 mg (05/08/24 1129)   dextrose , fentaNYL  (SUBLIMAZE ) injection, hydrOXYzine , oxyCODONE -acetaminophen , prochlorperazine 

## 2024-05-08 NOTE — Progress Notes (Signed)
 Pharmacy Antibiotic Note  Ashley Freeman is a 34 y.o. female admitted on 05/05/2024 with sepsis.  Pharmacy has been consulted for Vancomycin  dosing. She is noted with ESRD on HD PTA TTS  -HD 1/9; vancomycin  750mg  given then -Blood cultures- ngtd  Plan: -Hold further vancomycin  for now and will follow HD plans -Continue cefepime  1gm IV q24h  Height: 5' 2.99 (160 cm) Weight: 61.2 kg (134 lb 14.7 oz) IBW/kg (Calculated) : 52.38  Temp (24hrs), Avg:97.4 F (36.3 C), Min:95.1 F (35.1 C), Max:98.4 F (36.9 C)  Recent Labs  Lab 05/05/24 2146 05/06/24 0256 05/06/24 0649 05/07/24 0448 05/07/24 2118 05/07/24 2357 05/08/24 0741  WBC 10.0 11.1*  --  16.0*  --  9.7 9.7  CREATININE 5.09* 5.18* 5.48* 5.49*  --  5.93* 6.22*  LATICACIDVEN  --   --   --   --  1.5 1.5  --     Estimated Creatinine Clearance: 10.8 mL/min (A) (by C-G formula based on SCr of 6.22 mg/dL (H)).     Antimicrobials this admission: Vancomycin  1/8 >> 1/15 Cefepime  1/8 >> 1/15  Metronidazole  1/8>>1/15  Thank you for allowing pharmacy to be a part of this patients care.  Prentice Poisson, PharmD Clinical Pharmacist **Pharmacist phone directory can now be found on [email or web address] ([email or web address]).  Listed under [facility].

## 2024-05-08 NOTE — Progress Notes (Signed)
" °  Progress Note   Patient: Ashley Freeman FMW:[id number] DOB: 07-24-1990 DOA: 05/05/2024     2 DOS: the patient was seen and examined on 05/08/2024   Brief hospital course: 34 year old F with PMH of DM-1, ESRD on HD TTS, HFrEF, bipolar disorder and recent hospitalization for DKA returning with generalized body ache and not feeling well, and admitted with diabetic ketoacidosis.  Patient reports compliance with home insulin .  In ED, elevated BP to 185/94.  CBG > 600.  BMP glucose 826.  Bicarb 14.  AG 24.  AST 190.  ALT 65.  pH 7.24.  DHEA 5.80.  Pregnancy test negative.  CXR showed cardiomegaly with decreased vascular congestion and interstitial edema compared to prior.  Started on insulin  drip and IV fluid and admitted for further care.  Assessment and Plan: Poorly controlled DM-1 with diabetic ketoacidosis and level 2 hypoglycemia: Unclear cause of DKA.  She reports compliance with her insulin .  Still with anion gap metabolic acidosis likely from ESRD.  BHA has normalized. -glucose remains very labile with bouts of hypoglycemia, symptomatic -Glucose trends have now improved, now off D5 fluids for hypoglycemia -F/u on Diabetic Coordinator recs, who no doubt know this pt well    ESRD on HD TTS: No emergent need for HD. -Nephrology following   Chronic HFrEF: Appears euvolemic on exam.  No longer on Entresto  due to concern for angioedema. - Continue beta-blocker - Fluid management by dialysis.   Elevated LFT: Pattern consistent with alcohol  or rhabdo.  Denies alcohol .   History of seizure: Stable - Continue home Vimpat . -as pt is more alert, will transition back to PO   History of bipolar disorder: Stable - Continue home Zyprexa  and Cymbalta    Essential hypertension: BP improved. - Continue home Coreg    Pancreatic insufficiency - Continue home Creon    Migraine/myalgia/dry cough: - RVP and SARS neg - As needed Tylenol  fentanyl  -Head CT reviewed, neg  Sepsis of unclear  etiology -Pt recently hypothermic with leukocytosis of unclear etiology -Blood cx neg thus far. CXR unremarkable -Broad spec abx were started empirically on 1/8 with clinical improvement -Will plan to narrow coverage with goal to wean off      Subjective: Pt seen on dialysis. No issues. Today much more alert and interactive  Physical Exam: Vitals:   05/08/24 1200 05/08/24 1230 05/08/24 1237 05/08/24 1341  BP: (!) 163/81 (!) 167/79 (!) 170/86 (!) 161/80  Pulse: 79 78 77 82  Resp: 19 20 17 16   Temp:   98.4 F (36.9 C) 97.9 F (36.6 C)  TempSrc:    Axillary  SpO2: 99% 99% 98% 98%  Weight:   61.2 kg   Height:       General exam: Awake, laying in bed, in nad Respiratory system: Normal respiratory effort, no wheezing Cardiovascular system: regular rate, s1, s2 Gastrointestinal system: Soft, nondistended, positive BS Central nervous system: CN2-12 grossly intact, strength intact Extremities: Perfused, no clubbing Skin: Normal skin turgor, no notable skin lesions seen   Data Reviewed:  Labs reviewed: Na 130, K 4.9, Cr 6.22, WBC 9.7, Hgb 8.3, Plts 260  Family Communication: Pt in room, family not at bedside  Disposition: Status is: Inpatient Remains inpatient appropriate because: severity of illness  Planned Discharge Destination: Home     Author: Garnette Pelt, MD 05/08/2024 4:06 PM  For on call review [email or web address].  "

## 2024-05-08 NOTE — Progress Notes (Signed)
 PIV consult: Pt with limited venous access options. RUE restricted. L forearm edematous, RN reports prior IV infiltration. 22g Placed L hand w US  guidance. Please consider temporary or tunneled central line for prolonged venous access. Pt is not a PICC candidate.

## 2024-05-09 DIAGNOSIS — E1010 Type 1 diabetes mellitus with ketoacidosis without coma: Secondary | ICD-10-CM | POA: Diagnosis not present

## 2024-05-09 LAB — COMPREHENSIVE METABOLIC PANEL WITH GFR
ALT: 32 U/L (ref 0–44)
AST: 28 U/L (ref 15–41)
Albumin: 3 g/dL — ABNORMAL LOW (ref 3.5–5.0)
Alkaline Phosphatase: 388 U/L — ABNORMAL HIGH (ref 38–126)
Anion gap: 12 (ref 5–15)
BUN: 26 mg/dL — ABNORMAL HIGH (ref 6–20)
CO2: 24 mmol/L (ref 22–32)
Calcium: 8.1 mg/dL — ABNORMAL LOW (ref 8.9–10.3)
Chloride: 99 mmol/L (ref 98–111)
Creatinine, Ser: 4.47 mg/dL — ABNORMAL HIGH (ref 0.44–1.00)
GFR, Estimated: 13 mL/min — ABNORMAL LOW
Glucose, Bld: 107 mg/dL — ABNORMAL HIGH (ref 70–99)
Potassium: 3.7 mmol/L (ref 3.5–5.1)
Sodium: 135 mmol/L (ref 135–145)
Total Bilirubin: 0.3 mg/dL (ref 0.0–1.2)
Total Protein: 4.8 g/dL — ABNORMAL LOW (ref 6.5–8.1)

## 2024-05-09 LAB — GLUCOSE, CAPILLARY
Glucose-Capillary: 280 mg/dL — ABNORMAL HIGH (ref 70–99)
Glucose-Capillary: 354 mg/dL — ABNORMAL HIGH (ref 70–99)
Glucose-Capillary: 236 mg/dL — ABNORMAL HIGH (ref 70–99)
Glucose-Capillary: 219 mg/dL — ABNORMAL HIGH (ref 70–99)

## 2024-05-09 LAB — CBC
HCT: 28.4 % — ABNORMAL LOW (ref 36.0–46.0)
Hemoglobin: 8.9 g/dL — ABNORMAL LOW (ref 12.0–15.0)
MCH: 26.2 pg (ref 26.0–34.0)
MCHC: 31.3 g/dL (ref 30.0–36.0)
MCV: 83.5 fL (ref 80.0–100.0)
Platelets: 273 K/uL (ref 150–400)
RBC: 3.4 MIL/uL — ABNORMAL LOW (ref 3.87–5.11)
RDW: 14.8 % (ref 11.5–15.5)
WBC: 10.2 K/uL (ref 4.0–10.5)
nRBC: 0 % (ref 0.0–0.2)

## 2024-05-09 MED ORDER — HEPARIN SODIUM (PORCINE) 1000 UNIT/ML IJ SOLN
2000.0000 [IU] | Freq: Once | INTRAMUSCULAR | Status: AC
  Administered 2024-05-09: 2000 [IU] via INTRAVENOUS

## 2024-05-09 MED ORDER — HEPARIN SODIUM (PORCINE) 1000 UNIT/ML IJ SOLN
INTRAMUSCULAR | Status: AC
  Filled 2024-05-09: qty 3

## 2024-05-09 MED ORDER — HEPARIN SODIUM (PORCINE) 1000 UNIT/ML IJ SOLN
INTRAMUSCULAR | Status: AC
  Filled 2024-05-09: qty 2

## 2024-05-09 MED ORDER — SUMATRIPTAN SUCCINATE 25 MG PO TABS
25.0000 mg | ORAL_TABLET | Freq: Once | ORAL | Status: AC
  Administered 2024-05-09: 25 mg via ORAL
  Filled 2024-05-09: qty 1

## 2024-05-09 MED ORDER — METOCLOPRAMIDE HCL 5 MG/ML IJ SOLN
5.0000 mg | Freq: Once | INTRAMUSCULAR | Status: AC
  Administered 2024-05-09: 5 mg via INTRAVENOUS
  Filled 2024-05-09: qty 2

## 2024-05-09 MED ORDER — HYDROXYZINE HCL 25 MG PO TABS
ORAL_TABLET | ORAL | Status: AC
  Filled 2024-05-09: qty 1

## 2024-05-09 NOTE — Progress Notes (Signed)
" °  Progress Note   Patient: Ashley Freeman FMW:[id number] DOB: 05/02/1990 DOA: 05/05/2024     3 DOS: the patient was seen and examined on 05/09/2024   Brief hospital course: 34 year old F with PMH of DM-1, ESRD on HD TTS, HFrEF, bipolar disorder and recent hospitalization for DKA returning with generalized body ache and not feeling well, and admitted with diabetic ketoacidosis.  Patient reports compliance with home insulin .  In ED, elevated BP to 185/94.  CBG > 600.  BMP glucose 826.  Bicarb 14.  AG 24.  AST 190.  ALT 65.  pH 7.24.  DHEA 5.80.  Pregnancy test negative.  CXR showed cardiomegaly with decreased vascular congestion and interstitial edema compared to prior.  Started on insulin  drip and IV fluid and admitted for further care.  Assessment and Plan: Poorly controlled DM-1 with diabetic ketoacidosis and level 2 hypoglycemia: Unclear cause of DKA.  She reports compliance with her insulin .  Still with anion gap metabolic acidosis likely from ESRD.  BHA has normalized. -glucose remains very labile with bouts of hypoglycemia, symptomatic -Glucose trends have now improved, now off D5 fluids for hypoglycemia -Diabetic coordinator recs to continue 5 units long-acting with SSI on d/c    ESRD on HD TTS:  -Nephrology following for routine HD   Chronic HFrEF: Appears euvolemic on exam.  No longer on Entresto  due to concern for angioedema. - Continue beta-blocker - Fluid management by dialysis.   Elevated LFT: Pattern consistent with alcohol  or rhabdo.  Denies alcohol .   History of seizure: Stable - Continue home Vimpat .   History of bipolar disorder: Stable - Continue home Zyprexa  and Cymbalta    Essential hypertension: BP improved. - Continue home Coreg    Pancreatic insufficiency - Continue home Creon    Migraine/myalgia/dry cough: - RVP and SARS neg - As needed Tylenol  fentanyl  -Head CT reviewed, neg  Sepsis physiology of unclear etiology -Pt recently hypothermic with  leukocytosis of unclear etiology -Blood cx neg thus far. CXR unremarkable -Broad spec abx were started empirically on 1/8 with clinical improvement -Clinically much improved today with no obvious source of infection.  -Will stop empiric abx      Subjective: Pt seen standing at sink brushing her teeth. Feeling much better today  Physical Exam: Vitals:   05/09/24 1530 05/09/24 1549 05/09/24 1600 05/09/24 1630  BP: (!) 161/95 (!) 173/64 (!) 199/56 (!) 105/92  Pulse:      Resp: (!) 21 (!) 25 (!) 30 (!) 26  Temp: 98 F (36.7 C)     TempSrc: Oral     SpO2: 97% 100% 98% 96%  Weight: 63.8 kg     Height:       General exam: Conversant, in no acute distress Respiratory system: normal chest rise, clear, no audible wheezing Cardiovascular system: regular rhythm, s1-s2 Gastrointestinal system: Nondistended, nontender, pos BS Central nervous system: No seizures, no tremors Extremities: No cyanosis, no joint deformities Skin: No rashes, no pallor Psychiatry: Affect normal // no auditory hallucinations   Data Reviewed:  Labs reviewed: Na 135, K 3.7, WBC 10.2, Hgb 8.9, Plts 273  Family Communication: Pt in room, family not at bedside  Disposition: Status is: Inpatient Remains inpatient appropriate because: severity of illness  Planned Discharge Destination: Home     Author: Garnette Pelt, MD 05/09/2024 4:47 PM  For on call review [email or web address].  "

## 2024-05-09 NOTE — Progress Notes (Signed)
 [facility] Progress Note  Subjective:  Pt seen in HD unit  No new c/o's Feels better, wants to stay 1 night more   Vitals:   05/08/24 2053 05/09/24 0048 05/09/24 0509 05/09/24 0756  BP: (!) 156/82 127/74 134/75 110/89  Pulse: 81 80 85 94  Resp: 20 (!) 23 20 20   Temp: 97.9 F (36.6 C) 98.2 F (36.8 C) 98.1 F (36.7 C) 98.3 F (36.8 C)  TempSrc: Oral Oral Oral Oral  SpO2: 99% 95% 97% (!) 88%  Weight:   64.8 kg   Height:        Exam: Gen fully alert  No jvd or bruits Chest clear bilat to bases RRR no MRG Abd soft ntnd no mass, 1-2+ ascites +bs Ext no pitting edema LEs or UEs Neuro as above    RUE AVG + bruit    Home bp meds: Coreg   Bumex  Entresto  (was supposed to be stopped last admit)    OP HD: TTS [facility] [place] ([address]h  B400  57.5kg  2K bath  R AVG  Hep 1600   CXR 1/07 - resolved IS edema and vasc congestion c/t last    Assessment/ Plan:   # ESRD - on HD TTS in [place] - Had HD here yesterday off schedule  - HD today to get back on schedule     # HTN - BP's up 120-150/90 - getting home coreg      # Volume - +ascites on exam, min LE edema - up by wts - CXR clear  - follow     # Anemia of esrd - Hb 8-10 here, follow    # DKA  - per pmd    # AMS  - typical for her when in DKA - resolved now         Myer Fret MD  [facility] 05/09/2024, 1:34 PM  Recent Labs  Lab 05/07/24 0448 05/07/24 2357 05/08/24 0741 05/09/24 0329  HGB 10.7*   < > 8.3* 8.9*  ALBUMIN  3.2*   < > 3.0* 3.0*  CALCIUM  8.5*   < > 8.1* 8.1*  PHOS 4.5  --  5.4*  --   CREATININE 5.49*   < > 6.22* 4.47*  K 4.2   < > 4.9 3.7   < > = values in this interval not displayed.   No results for input(s): IRON , TIBC, FERRITIN in the last 168 hours. Inpatient medications:  carvedilol   12.5 mg Oral BID WC   Chlorhexidine  Gluconate Cloth  6 each Topical Q0600   Chlorhexidine  Gluconate Cloth  6 each Topical Q0600   DULoxetine   20 mg Oral  Daily   heparin   5,000 Units Subcutaneous Q8H   insulin  aspart  0-6 Units Subcutaneous TID WC   insulin  glargine-yfgn  5 Units Subcutaneous Q24H   lacosamide   100 mg Oral BID   lacosamide   50 mg Oral Q T,Th,Sat-1800   lipase/protease/amylase  12,000 Units Oral TID WC   metoCLOPramide  (REGLAN ) injection  5 mg Intravenous Once   OLANZapine   5 mg Oral QHS   SUMAtriptan   25 mg Oral Once     dextrose , fentaNYL  (SUBLIMAZE ) injection, hydrOXYzine , oxyCODONE -acetaminophen , prochlorperazine 

## 2024-05-09 NOTE — Progress Notes (Addendum)
 Received patient in bed to unit.  Alert and oriented.  Informed consent signed and in chart.   TX duration: 1hour.  Patient needles infiltrated.  Nephrologist informed.  Patient tolerated well.  Transported back to the room  Alert, without acute distress.  Hand-off given to patient's nurse.   Access used: Right upper arm graft Access issues: Patient moved arm a lot  Total UF removed: Medication(s) given: Atarax    05/09/24 1711  Vitals  Temp 97.9 F (36.6 C)  Temp Source Oral  BP 126/74  ECG Heart Rate 87  Resp (!) 25  Weight 63.2 kg  Type of Weight Post-Dialysis  Oxygen Therapy  SpO2 100 %  O2 Device Room Air  During Treatment Monitoring  Dialysate Potassium Concentration 3  Dialysate Calcium  Concentration 2.5  Duration of HD Treatment -hour(s) 1 hour(s)  Cumulative Fluid Removed (mL) per Treatment  750.06  HD Safety Checks Performed Yes  Intra-Hemodialysis Comments See progress note (patient infiltrated)  Post Treatment  Dialyzer Clearance Clear  Liters Processed 18.4  Fluid Removed (mL) 600 mL  Tolerated HD Treatment No (Comment)  Post-Hemodialysis Comments Patient infilitrated, possibly due to moving arm.  AVG/AVF Arterial Site Held (minutes) 5 minutes  AVG/AVF Venous Site Held (minutes) 5 minutes  Fistula / Graft Right Upper arm Arteriovenous vein graft  Placement Date/Time: 07/06/22 1013   Placed prior to admission: No  Orientation: Right  Access Location: Upper arm  Access Type: Arteriovenous vein graft  Site Condition No complications  Fistula / Graft Assessment Present;Thrill;Bruit  Status Patent;Deaccessed  Drainage Description None     Camellia Brasil LPN [facility]

## 2024-05-09 NOTE — Plan of Care (Signed)
   Problem: Education: Goal: Ability to describe self-care measures that may prevent or decrease complications (Diabetes Survival Skills Education) will improve Outcome: Progressing   Problem: Coping: Goal: Ability to adjust to condition or change in health will improve Outcome: Progressing   Problem: Fluid Volume: Goal: Ability to maintain a balanced intake and output will improve Outcome: Progressing   Problem: Nutritional: Goal: Maintenance of adequate nutrition will improve Outcome: Progressing

## 2024-05-09 NOTE — Inpatient Diabetes Management (Signed)
 Inpatient Diabetes Program Recommendations  AACE/ADA: New Consensus Statement on Inpatient Glycemic Control  Target Ranges:  Prepandial:   less than 140 mg/dL      Peak postprandial:   less than 180 mg/dL (1-2 hours)      Critically ill patients:  140 - 180 mg/dL    Latest Reference Range & Units 05/08/24 00:12 05/08/24 03:05 05/08/24 06:46 05/08/24 13:38 05/08/24 16:26  Glucose-Capillary 70 - 99 mg/dL 93 863 (H) 833 (H) 821 (H) 195 (H)   Review of Glycemic Control  Diabetes history: DM1 Outpatient Diabetes medications: Lantus  5 units daily, Novolog  5 units TID with meals plus correction Current orders for Inpatient glycemic control: Semglee  5 units Q24H, Novolog  0-6 units TID with meals  Inpatient Diabetes Program Recommendations:    Insulin : Noted patient has Semglee  5 units Q24H ordered. Patient received Semglee  5 units at 14:52 on 1/9 and has already received Semglee  5 units today at 8:33 am. Patient may experience hypoglycemia with Semglee  doses being given with 17 hours of each other.  Outpatient DM: Patient has Type 1 DM and is very sensitive to insulin  and also has ESRD.  Recommend to discharge on Lantus  5 units Q24H and Novolog  0-6 units TID.  NOTE: Noted consult for Diabetes Coordinator for discharge recommendations. Diabetes Coordinator is not on campus over the weekend but available by pager from 8am to 5pm for questions or concerns. Patient is well known to team as she has had 17 hospital admissions in the past 6 months.  Thanks, Earnie Gainer, RN, MSN, CDCES Diabetes Coordinator Inpatient Diabetes Program [phone] (Team Pager from 8am to 5pm)

## 2024-05-09 NOTE — Progress Notes (Addendum)
 Patient transferred to room 5C03 for dialysis.  I was unable to transfer patient to this room in [facility].  Camellia Brasil, LPN-[facility]

## 2024-05-10 DIAGNOSIS — E1010 Type 1 diabetes mellitus with ketoacidosis without coma: Secondary | ICD-10-CM | POA: Diagnosis not present

## 2024-05-10 LAB — COMPREHENSIVE METABOLIC PANEL WITH GFR
ALT: 37 U/L (ref 0–44)
AST: 67 U/L — ABNORMAL HIGH (ref 15–41)
Albumin: 3 g/dL — ABNORMAL LOW (ref 3.5–5.0)
Alkaline Phosphatase: 409 U/L — ABNORMAL HIGH (ref 38–126)
Anion gap: 12 (ref 5–15)
BUN: 30 mg/dL — ABNORMAL HIGH (ref 6–20)
CO2: 23 mmol/L (ref 22–32)
Calcium: 7.9 mg/dL — ABNORMAL LOW (ref 8.9–10.3)
Chloride: 101 mmol/L (ref 98–111)
Creatinine, Ser: 4.76 mg/dL — ABNORMAL HIGH (ref 0.44–1.00)
GFR, Estimated: 12 mL/min — ABNORMAL LOW
Glucose, Bld: 212 mg/dL — ABNORMAL HIGH (ref 70–99)
Potassium: 4.3 mmol/L (ref 3.5–5.1)
Sodium: 137 mmol/L (ref 135–145)
Total Bilirubin: 0.2 mg/dL (ref 0.0–1.2)
Total Protein: 4.9 g/dL — ABNORMAL LOW (ref 6.5–8.1)

## 2024-05-10 LAB — GLUCOSE, CAPILLARY
Glucose-Capillary: 204 mg/dL — ABNORMAL HIGH (ref 70–99)
Glucose-Capillary: 202 mg/dL — ABNORMAL HIGH (ref 70–99)
Glucose-Capillary: 190 mg/dL — ABNORMAL HIGH (ref 70–99)

## 2024-05-10 MED ORDER — OXYCODONE-ACETAMINOPHEN 5-325 MG PO TABS: 1.0000 | ORAL_TABLET | Freq: Three times a day (TID) | ORAL | 0 refills | Status: DC | PRN

## 2024-05-10 MED ORDER — METHOCARBAMOL 500 MG PO TABS: 500.0000 mg | ORAL_TABLET | Freq: Four times a day (QID) | ORAL | 0 refills | Status: DC

## 2024-05-10 MED ORDER — INSULIN ASPART 100 UNIT/ML IJ SOLN
2.0000 [IU] | Freq: Three times a day (TID) | INTRAMUSCULAR | Status: DC
  Administered 2024-05-10: 2 [IU] via SUBCUTANEOUS

## 2024-05-10 MED ORDER — INSULIN ASPART 100 UNIT/ML FLEXPEN: 2.0000 [IU] | PEN_INJECTOR | Freq: Three times a day (TID) | SUBCUTANEOUS | Status: AC

## 2024-05-10 MED ORDER — ACETAMINOPHEN 325 MG PO TABS
650.0000 mg | ORAL_TABLET | Freq: Four times a day (QID) | ORAL | Status: DC | PRN
  Administered 2024-05-10: 650 mg via ORAL
  Filled 2024-05-10: qty 2

## 2024-05-10 MED ORDER — METHOCARBAMOL 500 MG PO TABS
500.0000 mg | ORAL_TABLET | Freq: Once | ORAL | Status: AC
  Administered 2024-05-10: 500 mg via ORAL
  Filled 2024-05-10: qty 1

## 2024-05-10 NOTE — Plan of Care (Signed)
" °  Problem: Education: Goal: Ability to describe self-care measures that may prevent or decrease complications (Diabetes Survival Skills Education) will improve Outcome: Progressing Goal: Individualized Educational Video(s) Outcome: Progressing   Problem: Coping: Goal: Ability to adjust to condition or change in health will improve Outcome: Progressing   Problem: Fluid Volume: Goal: Ability to maintain a balanced intake and output will improve Outcome: Progressing   Problem: Health Behavior/Discharge Planning: Goal: Ability to identify and utilize available resources and services will improve Outcome: Progressing Goal: Ability to manage health-related needs will improve Outcome: Progressing   Problem: Metabolic: Goal: Ability to maintain appropriate glucose levels will improve Outcome: Progressing   Problem: Nutritional: Goal: Maintenance of adequate nutrition will improve Outcome: Progressing Goal: Progress toward achieving an optimal weight will improve Outcome: Progressing   Problem: Skin Integrity: Goal: Risk for impaired skin integrity will decrease Outcome: Progressing   Problem: Tissue Perfusion: Goal: Adequacy of tissue perfusion will improve Outcome: Progressing   Problem: Education: Goal: Ability to describe self-care measures that may prevent or decrease complications (Diabetes Survival Skills Education) will improve Outcome: Progressing Goal: Individualized Educational Video(s) Outcome: Progressing   Problem: Cardiac: Goal: Ability to maintain an adequate cardiac output will improve Outcome: Progressing   Problem: Health Behavior/Discharge Planning: Goal: Ability to identify and utilize available resources and services will improve Outcome: Progressing Goal: Ability to manage health-related needs will improve Outcome: Progressing   Problem: Fluid Volume: Goal: Ability to achieve a balanced intake and output will improve Outcome: Progressing    Problem: Metabolic: Goal: Ability to maintain appropriate glucose levels will improve Outcome: Progressing   Problem: Nutritional: Goal: Maintenance of adequate nutrition will improve Outcome: Progressing Goal: Maintenance of adequate weight for body size and type will improve Outcome: Progressing   Problem: Respiratory: Goal: Will regain and/or maintain adequate ventilation Outcome: Progressing   Problem: Urinary Elimination: Goal: Ability to achieve and maintain adequate renal perfusion and functioning will improve Outcome: Progressing   Problem: Education: Goal: Knowledge of General Education information will improve Description: Including pain rating scale, medication(s)/side effects and non-pharmacologic comfort measures Outcome: Progressing   Problem: Health Behavior/Discharge Planning: Goal: Ability to manage health-related needs will improve Outcome: Progressing   Problem: Clinical Measurements: Goal: Ability to maintain clinical measurements within normal limits will improve Outcome: Progressing Goal: Will remain free from infection Outcome: Progressing Goal: Diagnostic test results will improve Outcome: Progressing Goal: Respiratory complications will improve Outcome: Progressing Goal: Cardiovascular complication will be avoided Outcome: Progressing   Problem: Activity: Goal: Risk for activity intolerance will decrease Outcome: Progressing   Problem: Nutrition: Goal: Adequate nutrition will be maintained Outcome: Progressing   Problem: Coping: Goal: Level of anxiety will decrease Outcome: Progressing   Problem: Elimination: Goal: Will not experience complications related to bowel motility Outcome: Progressing Goal: Will not experience complications related to urinary retention Outcome: Progressing   Problem: Pain Managment: Goal: General experience of comfort will improve and/or be controlled Outcome: Progressing   Problem: Safety: Goal:  Ability to remain free from injury will improve Outcome: Progressing   Problem: Skin Integrity: Goal: Risk for impaired skin integrity will decrease Outcome: Progressing   Problem: Fluid Volume: Goal: Hemodynamic stability will improve Outcome: Progressing   Problem: Clinical Measurements: Goal: Diagnostic test results will improve Outcome: Progressing Goal: Signs and symptoms of infection will decrease Outcome: Progressing   Problem: Respiratory: Goal: Ability to maintain adequate ventilation will improve Outcome: Progressing   "

## 2024-05-10 NOTE — Discharge Summary (Signed)
 " Physician Discharge Summary   Patient: Ashley Freeman MRN: [id number] DOB: July 21, 1990  Admit date:     05/05/2024  Discharge date: 05/10/2024  Discharge Physician: Garnette Pelt   PCP: Keven Crumbly Pap, MD   Recommendations at discharge:    Follow up with PCP in 1-2 weeks  Discharge Diagnoses: Active Problems:   DKA, type 1 (HCC)   DKA (diabetic ketoacidosis) (HCC)   ESRD (end stage renal disease) (HCC)   HFrEF (heart failure with reduced ejection fraction) (HCC)   Seizure disorder (HCC)   Bipolar disorder (HCC)   Normocytic anemia  Resolved Problems:   * No resolved hospital problems. *  Hospital Course: 34 year old F with PMH of DM-1, ESRD on HD TTS, HFrEF, bipolar disorder and recent hospitalization for DKA returning with generalized body ache and not feeling well, and admitted with diabetic ketoacidosis.  Patient reports compliance with home insulin .  In ED, elevated BP to 185/94.  CBG > 600.  BMP glucose 826.  Bicarb 14.  AG 24.  AST 190.  ALT 65.  pH 7.24.  DHEA 5.80.  Pregnancy test negative.  CXR showed cardiomegaly with decreased vascular congestion and interstitial edema compared to prior.  Started on insulin  drip and IV fluid and admitted for further care.  Assessment and Plan: Poorly controlled DM-1 with diabetic ketoacidosis and level 2 hypoglycemia: Unclear cause of DKA.  She reports compliance with her insulin .  Still with anion gap metabolic acidosis likely from ESRD.  BHA has normalized. -glucose remains very labile with bouts of hypoglycemia, symptomatic -Glucose trends have now improved, now off D5 fluids for hypoglycemia -Diabetic coordinator recs to continue 5 units long-acting with SSI and novolog  2 units TID with meals if eating >50%   ESRD on HD TTS:  -Nephrology following for routine HD   Chronic HFrEF: Appears euvolemic on exam.  No longer on Entresto  due to concern for angioedema. - Continue beta-blocker - Fluid management by dialysis.    Elevated LFT: Pattern consistent with alcohol  or rhabdo.  Denies alcohol .   History of seizure: Stable - Continue home Vimpat .   History of bipolar disorder: Stable - Continue home Zyprexa  and Cymbalta    Essential hypertension: BP improved. - Continue home Coreg    Pancreatic insufficiency - Continue home Creon    Migraine/myalgia/dry cough: - RVP and SARS neg - As needed Tylenol  fentanyl  -Head CT reviewed, neg   Sepsis physiology of unclear etiology -Pt recently hypothermic with leukocytosis of unclear etiology -Blood cx neg thus far. CXR unremarkable -Broad spec abx were started empirically on 1/8 with clinical improvement -Clinically much improved today with no obvious source of infection.  -Will stop empiric abx     Consultants: Nephrology Procedures performed:   Disposition: Home Diet recommendation:  Carb modified diet DISCHARGE MEDICATION: Allergies as of 05/10/2024       Reactions   Keflex  [cephalexin ] Anaphylaxis   Ceftriaxone  in the past with no reaction   Penicillins Anaphylaxis, Hives, Rash   Vibramycin  [doxycycline ] Anaphylaxis   Benadryl  [diphenhydramine ] Itching   Entresto  [sacubitril -valsartan ] Swelling   angioedema   Dilaudid  [hydromorphone ] Itching   Methotrexate And Trimetrexate Rash   Roxicodone  [oxycodone ] Itching   Takes Percocet without issue        Medication List     STOP taking these medications    Entresto  24-26 MG Generic drug: sacubitril -valsartan    lamoTRIgine  25 MG tablet Commonly known as: LAMICTAL    levETIRAcetam  250 MG tablet Commonly known as: KEPPRA    pantoprazole  40 MG tablet  Commonly known as: PROTONIX    rosuvastatin  40 MG tablet Commonly known as: CRESTOR    sodium bicarbonate  650 MG tablet       TAKE these medications    albuterol  108 (90 Base) MCG/ACT inhaler Commonly known as: VENTOLIN  HFA Inhale 2 puffs into the lungs every 4 (four) hours as needed for wheezing or shortness of breath.    bumetanide  2 MG tablet Commonly known as: BUMEX  Take 10 mg by mouth daily.   carvedilol  25 MG tablet Commonly known as: COREG  Take 1 tablet (25 mg total) by mouth 2 (two) times daily with a meal. Follow with your PCP for refills.   Cinnamon 500 MG capsule Take 500 mg by mouth in the morning.   DULoxetine  20 MG capsule Commonly known as: CYMBALTA  Take 1 capsule (20 mg total) by mouth daily.   ELDERBERRY IMMUNE HEALTH GUMMY PO Take 1 tablet by mouth daily.   fluticasone  50 MCG/ACT nasal spray Commonly known as: FLONASE  Place 2 sprays into both nostrils daily as needed for allergies or rhinitis.   hydrOXYzine  25 MG tablet Commonly known as: ATARAX  Take 1 tablet (25 mg total) by mouth 3 (three) times daily as needed for anxiety, itching, nausea or vomiting.   lacosamide  50 MG Tabs tablet Commonly known as: VIMPAT  Take 2 tablets (100 mg total) by mouth 2 (two) times daily. Take additional 1 tablet (50 mg) after dialysis on Tuesdays, Thursdays and Saturdays   Lantus  SoloStar 100 UNIT/ML Solostar Pen Generic drug: insulin  glargine Inject 5 Units into the skin daily.   methocarbamol  500 MG tablet Commonly known as: ROBAXIN  Take 1 tablet (500 mg total) by mouth 4 (four) times daily.   NovoLOG  FlexPen 100 UNIT/ML FlexPen Generic drug: insulin  aspart Before each meal 3 times a day, 140-199 - 2 units, 200-250 - 4 units, 251-299 - 6 units,  300-349 - 7 units,  350 or above 8 units. What changed:  how much to take how to take this when to take this additional instructions   insulin  aspart 100 UNIT/ML FlexPen Commonly known as: NOVOLOG  Inject 2 Units into the skin 3 (three) times daily with meals. What changed: You were already taking a medication with the same name, and this prescription was added. Make sure you understand how and when to take each.   OLANZapine  5 MG tablet Commonly known as: ZYPREXA  Take 1 tablet (5 mg total) by mouth at bedtime.   oxyCODONE -acetaminophen   5-325 MG tablet Commonly known as: PERCOCET/ROXICET Take 1 tablet by mouth every 8 (eight) hours as needed for moderate pain (pain score 4-6).   Pancrelipase  (Lip-Prot-Amyl) 3000-9500 units Cpep Take 3,000 Units by mouth in the morning, at noon, and at bedtime.   prochlorperazine  5 MG tablet Commonly known as: COMPAZINE  Take 5 mg by mouth every 6 (six) hours as needed for nausea or vomiting.   sevelamer  carbonate 800 MG tablet Commonly known as: RENVELA  Take 1 tablet (800 mg total) by mouth 3 (three) times daily with meals.   SUMAtriptan  50 MG tablet Commonly known as: IMITREX  Take 50 mg by mouth every 2 (two) hours as needed for migraine or headache.   Valtoco  15 MG Dose 2 x 7.5 MG/0.1ML Lqpk Generic drug: diazePAM  (15 MG Dose) Place 15 mg into the nose as needed (sz lasting over 2 minutes).   Veltassa  16.8 g Pack Generic drug: Patiromer  Sorbitex Calcium  Take 1 packet by mouth daily at 12 noon.        Follow-up Information  [name], [name], MD Follow up in 2 week(s).   Specialty: Family Medicine Why: Hospital follow up Contact information: [address] [place] [address] [phone]                Discharge Exam: [name]   05/09/24 0509 05/09/24 1530 05/09/24 1711  Weight: 64.8 kg 63.8 kg 63.2 kg   General exam: Conversant, in no acute distress Respiratory system: normal chest rise, clear, no audible wheezing Cardiovascular system: regular rhythm, s1-s2 Gastrointestinal system: Nondistended, nontender, pos BS Central nervous system: No seizures, no tremors Extremities: No cyanosis, no joint deformities Skin: No rashes, no pallor Psychiatry: Affect normal // no auditory hallucinations   Condition at discharge: fair  The results of significant diagnostics from this hospitalization (including imaging, microbiology, ancillary and laboratory) are listed below for reference.   Imaging Studies: DG Abd 1 View Result Date: 05/07/2024 EXAM: 1  VIEW XRAY OF THE ABDOMEN 05/07/2024 05:44:00 PM COMPARISON: 05/06/2024 CLINICAL HISTORY: Ileus (HCC) FINDINGS: BOWEL: Gas filled loops of nondilated small bowel with scattered gas in the cecum and transverse colon. This appearance is nonobstructive but raises the possibility adynamic small bowel ileus or enteritis. SOFT TISSUES: Cholecystectomy clips present. No abnormal calcifications. BONES: No acute fracture. IMPRESSION: 1. Possible adynamic ileus or enteritis. Electronically signed by: Pinkie Pebbles MD MD 05/07/2024 07:31 PM EST RP Workstation: [id number]   CT HEAD WO CONTRAST ( ) Result Date: 05/07/2024 EXAM: CT HEAD WITHOUT CONTRAST 05/07/2024 07:24:03 PM TECHNIQUE: CT of the head was performed without the administration of intravenous contrast. Automated exposure control, iterative reconstruction, and/or weight based adjustment of the mA/kV was utilized to reduce the radiation dose to as low as reasonably achievable. COMPARISON: 04/13/2024 CLINICAL HISTORY: Mental status change, unknown cause. FINDINGS: BRAIN AND VENTRICLES: No acute hemorrhage. No evidence of acute infarct. No hydrocephalus. No extra-axial collection. No mass effect or midline shift. Intracranial atherosclerosis. ORBITS: No acute abnormality. SINUSES: No acute abnormality. SOFT TISSUES AND SKULL: No acute soft tissue abnormality. No skull fracture. IMPRESSION: 1. No acute intracranial abnormality. Electronically signed by: Pinkie Pebbles MD MD 05/07/2024 07:27 PM EST RP Workstation: [id number]   DG Abd 1 View Result Date: 05/06/2024 CLINICAL DATA:  Abdominal pain EXAM: ABDOMEN - 1 VIEW COMPARISON:  04/23/2024 FINDINGS: Mild gaseous distention of the stomach. No gaseous small bowel dilatation to suggest obstruction. Gas is visible in a nondilated splenic flexure of the colon. Surgical clips in the right upper quadrant suggest prior cholecystectomy. IMPRESSION: Mild gaseous distention of the stomach. No gaseous small bowel  dilatation to suggest obstruction. Electronically Signed   By: Camellia Candle M.D.   On: 05/06/2024 06:53   DG Chest Port 1 View Result Date: 05/06/2024 CLINICAL DATA:  Cough EXAM: PORTABLE CHEST 1 VIEW COMPARISON:  04/30/2024, 04/24/2024 FINDINGS: Cardiomegaly. No focal airspace disease, pleural effusion or pneumothorax. Decreased vascular congestion and interstitial edema compared to prior. IMPRESSION: Cardiomegaly with decreased vascular congestion and interstitial edema compared to prior. Electronically Signed   By: Luke Bun M.D.   On: 05/06/2024 00:18   DG Chest Port 1 View Result Date: 04/30/2024 EXAM: 1 VIEW(S) XRAY OF THE CHEST 04/30/2024 08:32:51 PM COMPARISON: 04/24/2024 CLINICAL HISTORY: SOB FINDINGS: LUNGS AND PLEURA: Low lung volumes. Perihilar vascular fullness with bilateral interstitial prominence. Decreased retrocardiac opacity. Trace right pleural effusion. No focal pulmonary opacity. No pneumothorax. HEART AND MEDIASTINUM: Unchanged cardiomegaly. BONES AND SOFT TISSUES: No acute osseous abnormality. IMPRESSION: 1. Mild pulmonary vascular congestion and interstitial edema. 2. Trace right pleural  effusion. 3. Unchanged cardiomegaly. Electronically signed by: Greig Pique MD 04/30/2024 08:38 PM EST RP Workstation: [id number]   DG CHEST PORT 1 VIEW Result Date: 04/24/2024 CLINICAL DATA:  Pleural effusion. EXAM: PORTABLE CHEST 1 VIEW COMPARISON:  Radiograph yesterday FINDINGS: The endotracheal tube tip is 2.8 cm from the carina. Tip and side port of the enteric tube below the diaphragm in the stomach. Lung volumes are low. Small bilateral pleural effusions. On the right this is likely more layering than on prior exam. Persistent right perihilar opacity, although improved from prior. Central vascular congestion. No pneumothorax. IMPRESSION: 1. Small bilateral pleural effusions. On the right this is likely more layering than on prior exam. 2. Persistent right perihilar opacity, although  improved from prior. 3. Central vascular congestion. Electronically Signed   By: Andrea Gasman M.D.   On: 04/24/2024 14:13   Overnight EEG with video Result Date: 04/24/2024 Shelton Arlin KIDD, MD     04/25/2024  7:55 AM Patient Name: Ashley Freeman MRN: [id number] Epilepsy Attending: Arlin KIDD Shelton Referring Physician/Provider: everitt Clint Abbey Earle FORBES, NP Duration: 04/23/2024 1644 to 04/24/2024 1644 Patient history:  34 y.o. female who originally presented with generalized pain. She was noted to have an extremely high blood glucose and was found to be in DKA.  She then had seizure activity in the emergency department. EEG to evaluate for seizure Level of alertness:comatose AEDs during EEG study: LCM, Propofol  Technical aspects: This EEG study was done with scalp electrodes positioned according to the 10-20 International system of electrode placement. Electrical activity was reviewed with band pass filter of 1-70Hz , sensitivity of 7 uV/mm, display speed of 39mm/sec with a 60Hz  notched filter applied as appropriate. EEG data were recorded continuously and digitally stored.  Video monitoring was available and reviewed as appropriate. Description: EEG initially showed burst suppression with bursts of generalized 5 to 6 Hz theta slowing lasting 1-2 seconds admixed with 3-7 seconds of generalized suppression. Gradually EEG evolved into near continuous generalized 3-6hz  theta-delta slowing. Hyperventilation and photic stimulation were not performed.   EKG artifact was seen during the study ABNORMALITY - Burst suppression, generalized - Continuous slow, generalized IMPRESSION: This study is suggestive of severe to profound diffuse encephalopathy, likely related to sedation. No seizures or epileptiform discharges were seen throughout the recording. Arlin KIDD Shelton   DG Abd Portable 1V Result Date: 04/23/2024 CLINICAL DATA:  Nasogastric tube placement. EXAM: PORTABLE ABDOMEN - 1 VIEW COMPARISON:   None Available. FINDINGS: Nasogastric tube terminates in the stomach with the side port a few cm beyond the gastroesophageal junction. Surgical clips in the right upper quadrant. Abdomen is otherwise grossly unremarkable. IMPRESSION: Nasogastric tube terminates in the stomach. Chest radiograph dictated separately. Electronically Signed   By: Newell Eke M.D.   On: 04/23/2024 16:38   DG CHEST PORT 1 VIEW Result Date: 04/23/2024 CLINICAL DATA:  Nasogastric tube placement. EXAM: PORTABLE CHEST 1 VIEW COMPARISON:  04/23/2024 and CT chest 04/16/2024. FINDINGS: Endotracheal to terminates approximately 2.5 cm above the carina. Nasogastric tube is followed into the stomach with the side port likely a few cm beyond the gastroesophageal junction. Tip extends beyond the inferior margin of the image. Patient is rotated. Heart is enlarged. New right upper and right lower lobe collapse/consolidation with left perihilar and left lower lobe volume loss. Moderate right pleural effusion. No pneumothorax. IMPRESSION: 1. Satisfactory endotracheal tube placement. 2. New collapse/consolidation in the right upper and right lower lobes, possibly due to mucous plugging. 3. Moderate right pleural effusion.  4. Left perihilar and left lower lobe atelectasis. Electronically Signed   By: Newell Eke M.D.   On: 04/23/2024 16:37   DG Chest 2 View Result Date: 04/23/2024 CLINICAL DATA:  Recent pneumonia.  Fatigue. EXAM: CHEST - 2 VIEW COMPARISON:  04/13/2024 FINDINGS: Low volume film. The cardio pericardial silhouette is enlarged. Diffuse right-sided and retrocardiac left base airspace disease seen previously has decreased in the interval. No dense focal consolidative airspace disease on the current study. No substantial pleural effusion. No acute bony abnormality. Telemetry leads overlie the chest. IMPRESSION: Interval decrease in diffuse right-sided and retrocardiac left base airspace disease. No dense focal consolidative airspace  disease on the current study. Electronically Signed   By: Camellia Candle M.D.   On: 04/23/2024 10:12   CT CHEST WO CONTRAST Result Date: 04/17/2024 EXAM: CT CHEST WITHOUT CONTRAST 04/16/2024 08:37:00 PM TECHNIQUE: CT of the chest was performed without the administration of intravenous contrast. Multiplanar reformatted images are provided for review. Automated exposure control, iterative reconstruction, and/or weight based adjustment of the mA/kV was utilized to reduce the radiation dose to as low as reasonably achievable. COMPARISON: None available. CLINICAL HISTORY: Respiratory illness, nondiagnostic xray. FINDINGS: MEDIASTINUM: Mild coronary artery calcification. Mild cardiomegaly. No pericardial effusion. The central airways are clear. No central obstructing lesion. LYMPH NODES: No mediastinal, hilar or axillary lymphadenopathy. LUNGS AND PLEURA: Small bilateral pleural effusions. Diffuse ground-glass pulmonary infiltrate is noted with associated smooth and lobular septal thickening, best appreciated within the lung bases. These findings are consistent with small to moderate interstitial and alveolar pulmonary edema. No pneumothorax. SOFT TISSUES/BONES: Diffuse subcutaneous body wall edema consistent with anasarca and/or cardiogenic failure. No acute abnormality of the bones. UPPER ABDOMEN: Limited images of the upper abdomen demonstrates mild ascites. IMPRESSION: 1. Mild cardiomegaly and mild coronary artery calcification. 2. Small to moderate pulmonary edema. Small bilateral pleural effusions. Mild ascites and diffuse subcutaneous body wall edema, consistent with anasarca and/or cardiogenic failure. Electronically signed by: Dorethia Molt MD 04/17/2024 01:16 AM EST RP Workstation: [id number]   EEG adult Result Date: 04/15/2024 Shelton Arlin KIDD, MD     04/15/2024  5:38 PM Patient Name: NEISHA HINGER MRN: [id number] Epilepsy Attending: Arlin KIDD Shelton Referring Physician/Provider: Perri DELENA Meliton Mickey., MD Date: 04/15/2024 Duration: 24.20 mins Patient history: 34yo female with seizure. EEG to evaluate for seizure. Level of alertness: Awake AEDs during EEG study: LEV, LTG Technical aspects: This EEG study was done with scalp electrodes positioned according to the 10-20 International system of electrode placement. Electrical activity was reviewed with band pass filter of 1-70Hz , sensitivity of 7 uV/mm, display speed of 33mm/sec with a 60Hz  notched filter applied as appropriate. EEG data were recorded continuously and digitally stored.  Video monitoring was available and reviewed as appropriate. Description: The posterior dominant rhythm consists of 8 Hz activity of moderate voltage (25-35 uV) seen predominantly in posterior head regions, symmetric and reactive to eye opening and eye closing. Physiologic photic driving was not seen during photic stimulation. Hyperventilation was not performed.   Of note, parts of study were difficult due to significant electrode and movement artifact. IMPRESSION: This study is within normal limits. No seizures or epileptiform discharges were seen throughout the recording. A normal interictal EEG does not exclude the diagnosis of epilepsy. Arlin KIDD Shelton   DG Chest Port 1 View Result Date: 04/13/2024 EXAM: 1 VIEW(S) XRAY OF THE CHEST 04/13/2024 04:21:00 PM COMPARISON: 04/06/2024 CLINICAL HISTORY: cough FINDINGS: LINES, TUBES AND DEVICES: Right axillary stent. LUNGS AND  PLEURA: Asymmetric interstitial/perihilar prominence, right lung predominant, favoring asymmetric interstitial edema over multifocal infection. Superimposed right lower lobe opacity favors pneumonia over atelectasis. Small bilateral pleural effusions, layering on the right. No pneumothorax. HEART AND MEDIASTINUM: Mild cardiomegaly is improved. BONES AND SOFT TISSUES: No acute osseous abnormality. IMPRESSION: 1. Suspected asymmetric interstitial edema, right lung predominant, less likely multifocal  infection. 2. Superimposed right lower lobe opacity, favoring pneumonia over atelectasis. 3. Small bilateral pleural effusions, layering on the right. Electronically signed by: Pinkie Pebbles MD 04/13/2024 05:04 PM EST RP Workstation: [id number]   CT Head Wo Contrast Result Date: 04/13/2024 EXAM: CT HEAD WITHOUT 04/13/2024 04:33:32 PM TECHNIQUE: CT of the head was performed without the administration of intravenous contrast. Automated exposure control, iterative reconstruction, and/or weight based adjustment of the mA/kV was utilized to reduce the radiation dose to as low as reasonably achievable. COMPARISON: 11/11/2023 CLINICAL HISTORY: Seizure, new-onset, no history of trauma FINDINGS: BRAIN AND VENTRICLES: No acute intracranial hemorrhage. No mass effect or midline shift. No extra-axial fluid collection. No evidence of acute infarct. No hydrocephalus. Atherosclerosis of the carotid siphons and intracranial vertebral arteries. ORBITS: Bilateral lens replacement. SINUSES AND MASTOIDS: Mild mucosal thickening in the paranasal sinuses. SOFT TISSUES AND SKULL: No acute skull fracture. There is diffuse stranding of the facial subcutaneous fat bilaterally which could reflect edema or anasarca. IMPRESSION: 1. No acute intracranial abnormality. 2. Skull base atherosclerosis greater than expected for age. 3. There is diffuse stranding of the facial subcutaneous fat bilaterally which could reflect edema/anasarca. Electronically signed by: Donnice Mania MD 04/13/2024 04:42 PM EST RP Workstation: [id number]    Microbiology: Results for orders placed or performed during the hospital encounter of 05/05/24  Respiratory (~20 pathogens) panel by PCR     Status: None   Collection Time: 05/06/24  1:46 PM   Specimen: Nasopharyngeal Swab; Respiratory  Result Value Ref Range Status   Adenovirus NOT DETECTED NOT DETECTED Final   Coronavirus 229E NOT DETECTED NOT DETECTED Final    Comment: (NOTE) The Coronavirus on the  Respiratory Panel, DOES NOT test for the novel  Coronavirus (2019 nCoV)    Coronavirus HKU1 NOT DETECTED NOT DETECTED Final   Coronavirus NL63 NOT DETECTED NOT DETECTED Final   Coronavirus OC43 NOT DETECTED NOT DETECTED Final   Metapneumovirus NOT DETECTED NOT DETECTED Final   Rhinovirus / Enterovirus NOT DETECTED NOT DETECTED Final   Influenza A NOT DETECTED NOT DETECTED Final   Influenza B NOT DETECTED NOT DETECTED Final   Parainfluenza Virus 1 NOT DETECTED NOT DETECTED Final   Parainfluenza Virus 2 NOT DETECTED NOT DETECTED Final   Parainfluenza Virus 3 NOT DETECTED NOT DETECTED Final   Parainfluenza Virus 4 NOT DETECTED NOT DETECTED Final   Respiratory Syncytial Virus NOT DETECTED NOT DETECTED Final   Bordetella pertussis NOT DETECTED NOT DETECTED Final   Bordetella Parapertussis NOT DETECTED NOT DETECTED Final   Chlamydophila pneumoniae NOT DETECTED NOT DETECTED Final   Mycoplasma pneumoniae NOT DETECTED NOT DETECTED Final    Comment: Performed at [facility], [address] [place], [place] [address]  SARS Coronavirus 2 by RT PCR (hospital order, performed in [facility] hospital lab) *cepheid single result test* Nasopharyngeal Swab     Status: None   Collection Time: 05/06/24  1:46 PM   Specimen: Nasopharyngeal Swab; Nasal Swab  Result Value Ref Range Status   SARS Coronavirus 2 by RT PCR NEGATIVE NEGATIVE Final    Comment: Performed at [facility], [address] [place], [place] [address]  Culture, blood (Routine X 2) w Reflex to ID Panel     Status: None (Preliminary result)   Collection Time: 05/07/24  4:44 PM   Specimen: BLOOD  Result Value Ref Range Status   Specimen Description BLOOD SITE NOT SPECIFIED  Final   Special Requests   Final    BOTTLES DRAWN AEROBIC AND ANAEROBIC Blood Culture adequate volume   Culture   Final    NO GROWTH 3 DAYS Performed at [facility], [address], [place], [place] [address]    Report Status PENDING   Incomplete  Culture, blood (Routine X 2) w Reflex to ID Panel     Status: None (Preliminary result)   Collection Time: 05/07/24  4:44 PM   Specimen: BLOOD  Result Value Ref Range Status   Specimen Description BLOOD SITE NOT SPECIFIED  Final   Special Requests   Final    BOTTLES DRAWN AEROBIC ONLY Blood Culture adequate volume   Culture   Final    NO GROWTH 3 DAYS Performed at [facility], [address] [place], [place] [address]    Report Status PENDING  Incomplete   *Note: Due to a large number of results and/or encounters for the requested time period, some results have not been displayed. A complete set of results can be found in Results Review.    Labs: CBC: Recent Labs  Lab 05/05/24 2146 05/06/24 0256 05/07/24 0448 05/07/24 2357 05/08/24 0741 05/09/24 0329  WBC 10.0 11.1* 16.0* 9.7 9.7 10.2  NEUTROABS 6.2  --   --   --  4.6  --   HGB 9.3* 8.5* 10.7* 9.5* 8.3* 8.9*  HCT 30.6* 27.7* 34.1* 30.8* 26.7* 28.4*  MCV 87.4 86.3 82.8 84.6 84.2 83.5  PLT 195 182 331 264 260 273   Basic Metabolic Panel: Recent Labs  Lab 05/04/24 0352 05/05/24 2146 05/07/24 0448 05/07/24 2357 05/08/24 0741 05/09/24 0329 05/10/24 0337  NA 133*   < > 135 132* 130* 135 137  K 3.8   < > 4.2 4.7 4.9 3.7 4.3  CL 95*   < > 102 100 98 99 101  CO2 23   < > 21* 19* 18* 24 23  GLUCOSE 315*   < > 27* 91 239* 107* 212*  BUN 22*   < > 37* 40* 42* 26* 30*  CREATININE 3.69*   < > 5.49* 5.93* 6.22* 4.47* 4.76*  CALCIUM  8.5*   < > 8.5* 8.0* 8.1* 8.1* 7.9*  MG  --   --  2.3  --   --   --   --   PHOS 3.3  --  4.5  --  5.4*  --   --    < > = values in this interval not displayed.   Liver Function Tests: Recent Labs  Lab 05/05/24 2146 05/07/24 0448 05/07/24 2357 05/08/24 0741 05/09/24 0329 05/10/24 0337  AST 190*  --  59*  --  28 67*  ALT 65*  --  41  --  32 37  ALKPHOS 555*  --  441*  --  388* 409*  BILITOT 0.4  --  0.3  --  0.3 0.2  PROT 5.9*  --  4.7*  --  4.8* 4.9*  ALBUMIN  3.6  3.2* 3.0* 3.0* 3.0* 3.0*   CBG: Recent Labs  Lab 05/09/24 1856 05/09/24 2139 05/10/24 0613 05/10/24 0808 05/10/24 1204  GLUCAP 236* 219* 204* 202* 190*  Discharge time spent: less than 30 minutes.  Signed: Garnette Pelt, MD [facility] 05/10/2024 "

## 2024-05-10 NOTE — Inpatient Diabetes Management (Signed)
 Inpatient Diabetes Program Recommendations  AACE/ADA: New Consensus Statement on Inpatient Glycemic Control   Target Ranges:  Prepandial:   less than 140 mg/dL      Peak postprandial:   less than 180 mg/dL (1-2 hours)      Critically ill patients:  140 - 180 mg/dL    Latest Reference Range & Units 05/09/24 08:48 05/09/24 11:54 05/09/24 18:56 05/09/24 21:39 05/10/24 06:13  Glucose-Capillary 70 - 99 mg/dL 719 (H)    Semglee  5 units 354 (H)  Novolog  5 units 236 (H)  Novolog  2 units 219 (H) 204 (H)   Review of Glycemic Control  Diabetes history: DM1 Outpatient Diabetes medications: Lantus  5 units daily, Novolog  5 units TID with meals plus correction Current orders for Inpatient glycemic control: Semglee  5 units Q24H, Novolog  0-6 units TID with meals   Inpatient Diabetes Program Recommendations:     Insulin : No 8am Novolog  correction on 05/09/24. As a result glucose up to 354 mg/dl on 8/89/73 at 88:45 am. If post prandial glucose remains elevated and patient is eating well, may want to consider ordering Novolog  2 units TID with meals for meal coverage if patient eats at least 50% of meals.    Outpatient DM: Patient has Type 1 DM and is very sensitive to insulin  and also has ESRD.  Recommend to discharge on Lantus  5 units Q24H, Novolog  2 units TID with meals if patient eats at least 50% of meal, and Novolog  0-6 units TID for correction.   NOTE: Noted consult for Diabetes Coordinator for discharge recommendations. Diabetes Coordinator is not on campus over the weekend but available by pager from 8am to 5pm for questions or concerns. Patient is well known to team as she has had 17 hospital admissions in the past 6 months.   Thanks, Earnie Gainer, RN, MSN, CDCES Diabetes Coordinator Inpatient Diabetes Program [phone] (Team Pager from 8am to 5pm)

## 2024-05-10 NOTE — Progress Notes (Signed)
 [facility] Progress Note  Subjective:  Pt seen in room No c/o's Had 1 hr HD last night, needles infiltrated so stopped early   Vitals:   05/09/24 1800 05/09/24 1917 05/09/24 2336 05/10/24 0347  BP:  (!) 168/90 (!) 147/81 126/73  Pulse:  87 77 83  Resp: (!) 27 18 18 16   Temp:  98 F (36.7 C) 98.6 F (37 C) 98 F (36.7 C)  TempSrc:  Oral Oral Oral  SpO2:  100% 97% 96%  Weight:      Height:        Exam: Gen fully alert  No jvd or bruits Chest clear bilat to bases RRR no MRG Abd soft ntnd no mass, 1-2+ ascites +bs Ext no pitting edema LEs or UEs Neuro as above    RUE AVG+ bruit, no sig hematoma    Home bp meds: Coreg   Bumex  Entresto  (was supposed to be stopped last admit)    OP HD: TTS [facility] ([address] 3h  B400  57.5kg  2K bath  R AVG  Hep 1600   CXR 1/07 - resolved IS edema and vasc congestion c/t last    Assessment/ Plan:   # ESRD - on HD TTS in [place] - had HD here Friday and Sat  - Sat HD only 1 hr due to infiltration of needles - access looks good this am - possible D/C today per pmd     # HTN - BP's up 120-150/90 - getting home coreg     # Volume - +ascites on exam, min LE edema - up by wts - CXR was clear  - stable     # Anemia of esrd - Hb 8-10 here, follow    # DKA  - resolved - per pmd    # AMS  - typical for her with DKA - resolved, back to baseline          [employer] MD  [facility] 05/10/2024, 8:09 AM  Recent Labs  Lab 05/07/24 0448 05/07/24 2357 05/08/24 0741 05/09/24 0329 05/10/24 0337  HGB 10.7*   < > 8.3* 8.9*  --   ALBUMIN  3.2*   < > 3.0* 3.0* 3.0*  CALCIUM  8.5*   < > 8.1* 8.1* 7.9*  PHOS 4.5  --  5.4*  --   --   CREATININE 5.49*   < > 6.22* 4.47* 4.76*  K 4.2   < > 4.9 3.7 4.3   < > = values in this interval not displayed.   No results for input(s): IRON , TIBC, FERRITIN in the last 168 hours. Inpatient medications:  carvedilol   12.5 mg Oral BID WC   Chlorhexidine   Gluconate Cloth  6 each Topical Q0600   Chlorhexidine  Gluconate Cloth  6 each Topical Q0600   DULoxetine   20 mg Oral Daily   heparin   5,000 Units Subcutaneous Q8H   insulin  aspart  0-6 Units Subcutaneous TID WC   insulin  aspart  2 Units Subcutaneous TID WC   insulin  glargine-yfgn  5 Units Subcutaneous Q24H   lacosamide   100 mg Oral BID   lacosamide   50 mg Oral Q T,Th,Sat-1800   lipase/protease/amylase  12,000 Units Oral TID WC   OLANZapine   5 mg Oral QHS     dextrose , fentaNYL  (SUBLIMAZE ) injection, hydrOXYzine , oxyCODONE -acetaminophen , prochlorperazine 

## 2024-05-10 NOTE — Progress Notes (Signed)
 Orders received to discharge patient.  Telemetry monitor removed and CCMD notified.  PIV access removed without difficulty. Discharge instructions, follow up, medications and instructions for their use discussed with patient.  Patient and her belongings transferred to DC lounge for her to collect taxi voucher for transport home.

## 2024-05-11 ENCOUNTER — Other Ambulatory Visit: Payer: Self-pay

## 2024-05-11 ENCOUNTER — Emergency Department (HOSPITAL_COMMUNITY)
Admission: EM | Admit: 2024-05-11 | Discharge: 2024-05-12 | Discharge status: Against Medical Advice | Attending: Emergency Medicine | Admitting: Emergency Medicine

## 2024-05-11 ENCOUNTER — Encounter (HOSPITAL_COMMUNITY): Payer: Self-pay | Admitting: Emergency Medicine

## 2024-05-11 DIAGNOSIS — I5020 Unspecified systolic (congestive) heart failure: Secondary | ICD-10-CM | POA: Insufficient documentation

## 2024-05-11 DIAGNOSIS — R739 Hyperglycemia, unspecified: Secondary | ICD-10-CM | POA: Diagnosis present

## 2024-05-11 DIAGNOSIS — Z992 Dependence on renal dialysis: Secondary | ICD-10-CM | POA: Insufficient documentation

## 2024-05-11 DIAGNOSIS — N186 End stage renal disease: Secondary | ICD-10-CM | POA: Diagnosis not present

## 2024-05-11 DIAGNOSIS — I132 Hypertensive heart and chronic kidney disease with heart failure and with stage 5 chronic kidney disease, or end stage renal disease: Secondary | ICD-10-CM | POA: Diagnosis not present

## 2024-05-11 DIAGNOSIS — Z5329 Procedure and treatment not carried out because of patient's decision for other reasons: Secondary | ICD-10-CM | POA: Diagnosis not present

## 2024-05-11 DIAGNOSIS — Z794 Long term (current) use of insulin: Secondary | ICD-10-CM | POA: Insufficient documentation

## 2024-05-11 DIAGNOSIS — E1065 Type 1 diabetes mellitus with hyperglycemia: Secondary | ICD-10-CM | POA: Diagnosis not present

## 2024-05-11 LAB — I-STAT CHEM 8, ED
BUN: 60 mg/dL — ABNORMAL HIGH (ref 6–20)
Calcium, Ion: 1.11 mmol/L — ABNORMAL LOW (ref 1.15–1.40)
Chloride: 102 mmol/L (ref 98–111)
Creatinine, Ser: 6.2 mg/dL — ABNORMAL HIGH (ref 0.44–1.00)
Glucose, Bld: >700 mg/dL (ref 70–99)
HCT: 29 % — ABNORMAL LOW (ref 36.0–46.0)
Hemoglobin: 9.9 g/dL — ABNORMAL LOW (ref 12.0–15.0)
Potassium: 5.5 mmol/L — ABNORMAL HIGH (ref 3.5–5.1)
Sodium: 134 mmol/L — ABNORMAL LOW (ref 135–145)
TCO2: 22 mmol/L (ref 22–32)

## 2024-05-11 LAB — CBC
HCT: 29.2 % — ABNORMAL LOW (ref 36.0–46.0)
Hemoglobin: 8.5 g/dL — ABNORMAL LOW (ref 12.0–15.0)
MCH: 25.8 pg — ABNORMAL LOW (ref 26.0–34.0)
MCHC: 29.1 g/dL — ABNORMAL LOW (ref 30.0–36.0)
MCV: 88.8 fL (ref 80.0–100.0)
Platelets: 299 K/uL (ref 150–400)
RBC: 3.29 MIL/uL — ABNORMAL LOW (ref 3.87–5.11)
RDW: 15.3 % (ref 11.5–15.5)
WBC: 8 K/uL (ref 4.0–10.5)
nRBC: 0 % (ref 0.0–0.2)

## 2024-05-11 LAB — CBG MONITORING, ED: Glucose-Capillary: >600 mg/dL (ref 70–99)

## 2024-05-11 LAB — HCG, SERUM, QUALITATIVE: Preg, Serum: NEGATIVE

## 2024-05-11 MED ORDER — INSULIN REGULAR(HUMAN) IN NACL 100-0.9 UT/100ML-% IV SOLN
INTRAVENOUS | Status: DC
  Administered 2024-05-12: 5.5 [IU]/h via INTRAVENOUS
  Filled 2024-05-11: qty 100

## 2024-05-11 MED ORDER — LACTATED RINGERS IV SOLN: INTRAVENOUS | Status: DC

## 2024-05-11 MED ORDER — DEXTROSE 50 % IV SOLN
0.0000 mL | INTRAVENOUS | Status: DC | PRN
  Filled 2024-05-11: qty 50

## 2024-05-11 MED ORDER — DEXTROSE IN LACTATED RINGERS 5 % IV SOLN: INTRAVENOUS | Status: DC

## 2024-05-11 NOTE — ED Triage Notes (Addendum)
 Pt bib ems from home with c/o hyperglycemia, d/ced yesterday from the hospital. Last HD was Saturday, partial treatment, states that her shunt infiltrated. Hasn't taken her insulin  because she hasn't felt like eating.   High cbg 193/115 89% on RA, 99% on 2lpm 96HR

## 2024-05-11 NOTE — Progress Notes (Signed)
 Late Note Entry- May 11, 2024  Pt was d/c yesterday. Contacted [facility] this morning to advise staff of pt's d/c date and that pt should resume care tomorrow. D/C summary and last renal note faxed to clinic for continuation of care.   Randine Mungo Dialysis Navigator [phone]

## 2024-05-12 ENCOUNTER — Emergency Department (HOSPITAL_COMMUNITY)

## 2024-05-12 LAB — CULTURE, BLOOD (ROUTINE X 2)
Culture: NO GROWTH
Culture: NO GROWTH
Special Requests: ADEQUATE
Special Requests: ADEQUATE

## 2024-05-12 LAB — I-STAT VENOUS BLOOD GAS, ED
Acid-base deficit: 5 mmol/L — ABNORMAL HIGH (ref 0.0–2.0)
Bicarbonate: 21.6 mmol/L (ref 20.0–28.0)
Calcium, Ion: 1.15 mmol/L (ref 1.15–1.40)
HCT: 32 % — ABNORMAL LOW (ref 36.0–46.0)
Hemoglobin: 10.9 g/dL — ABNORMAL LOW (ref 12.0–15.0)
O2 Saturation: 96 %
Potassium: 4.8 mmol/L (ref 3.5–5.1)
Sodium: 135 mmol/L (ref 135–145)
TCO2: 23 mmol/L (ref 22–32)
pCO2, Ven: 47.1 mmHg (ref 44–60)
pH, Ven: 7.268 (ref 7.25–7.43)
pO2, Ven: 93 mmHg — ABNORMAL HIGH (ref 32–45)

## 2024-05-12 LAB — BETA-HYDROXYBUTYRIC ACID: Beta-Hydroxybutyric Acid: 0.82 mmol/L — ABNORMAL HIGH (ref 0.05–0.27)

## 2024-05-12 LAB — CBG MONITORING, ED
Glucose-Capillary: >600 mg/dL (ref 70–99)
Glucose-Capillary: >600 mg/dL (ref 70–99)
Glucose-Capillary: 537 mg/dL (ref 70–99)
Glucose-Capillary: 542 mg/dL (ref 70–99)

## 2024-05-12 LAB — BASIC METABOLIC PANEL WITH GFR
Anion gap: 16 — ABNORMAL HIGH (ref 5–15)
BUN: 52 mg/dL — ABNORMAL HIGH (ref 6–20)
CO2: 20 mmol/L — ABNORMAL LOW (ref 22–32)
Calcium: 8.7 mg/dL — ABNORMAL LOW (ref 8.9–10.3)
Chloride: 99 mmol/L (ref 98–111)
Creatinine, Ser: 6.33 mg/dL — ABNORMAL HIGH (ref 0.44–1.00)
GFR, Estimated: 8 mL/min — ABNORMAL LOW
Glucose, Bld: 838 mg/dL (ref 70–99)
Potassium: 4.9 mmol/L (ref 3.5–5.1)
Sodium: 135 mmol/L (ref 135–145)

## 2024-05-12 MED ORDER — PROCHLORPERAZINE EDISYLATE 10 MG/2ML IJ SOLN
10.0000 mg | Freq: Once | INTRAMUSCULAR | Status: AC
  Administered 2024-05-12: 10 mg via INTRAVENOUS
  Filled 2024-05-12: qty 2

## 2024-05-12 MED ORDER — ACETAMINOPHEN 500 MG PO TABS
1000.0000 mg | ORAL_TABLET | Freq: Once | ORAL | Status: AC
  Administered 2024-05-12: 1000 mg via ORAL
  Filled 2024-05-12: qty 2

## 2024-05-12 NOTE — ED Provider Notes (Signed)
 " [facility] DE[facility]MENT AT [facility] Provider Note  CSN: [id number] Arrival date & time: 05/11/24 2034  Chief Complaint(s) Hyperglycemia  History provided by patient. HPI & MDM Ashley Freeman is a 34 y.o. female with a past medical history listed below including bipolar disorder, type 1 diabetes, ESRD on dialysis TTS.   Hyperglycemia Severity:  Severe Onset quality:  Gradual Duration:  1 day Timing:  Constant Progression:  Worsening Chronicity:  Recurrent Diabetes status:  Controlled with insulin  Context: noncompliance   Relieved by:  None tried Associated symptoms: dehydration, fatigue and increased appetite   Associated symptoms: no nausea and no vomiting   Risk factors: hx of DKA    Patient was DC'd yesterday after admission for DKA. Also has ESRD on HD TTS. Last HD on Saturday while admitted. Only got 1hr 2/2 HD site infiltration.   Medical Decision Making Amount and/or Complexity of Data Reviewed Labs: ordered. Decision-making details documented in ED Course. Radiology: ordered and independent interpretation performed. Decision-making details documented in ED Course. ECG/medicine tests: ordered and independent interpretation performed. Decision-making details documented in ED Course.  Risk OTC drugs. Prescription drug management.    Hyperglycemia Patient admits to being noncompliant with her medication.  She does have hyperglycemia in the 800s.  No acidosis on VBG.  She does have mild anion gap and mildly elevated BHBA which could be related to dehydration.  Does not meet criteria for DKA at this time. Low IV infusion ordered IV insulin  drip given CBGs improved.    History of ESRD Last dialysis was Saturday but had to be cut short. Does not appear to be volume overload on exam Lungs clear to auscultation bilaterally and chest x-ray without evidence of pulmonary edema No hyperkalemia on labs. Patient does not require emergent  dialysis at this time.  4:18 AM Patient left AMA   Final Clinical Impression(s) / ED Diagnoses Final diagnoses:  Hyperglycemia  ESRD (end stage renal disease) on dialysis [facility])    Past Medical History Past Medical History:  Diagnosis Date   Abscess, gluteal, right 08/24/2013   Anemia 02/19/2012   Bartholin's gland abscess 09/19/2013   Bipolar disorder (HCC)    BV (bacterial vaginosis) 11/24/2015   Depression    Diabetes mellitus type I (HCC) 2001   Diagnosed at age 72 ; Type I   Diarrhea 05/30/2016   DKA (diabetic ketoacidoses) 08/19/2013   Also in 2018   ESRD (end stage renal disease) (HCC)    Gonorrhea 08/2011   Treated in 09/2011   HFrEF (heart failure with reduced ejection fraction) (HCC)    a. 2022 Echo: EF 40%; b. 10/2021 Echo: EF 55%; b. 07/2022 MV: No ischemia. EF 31%; c. 08/2022 Echo: EF 35%, mildly dil RV, sev TR.   History of trichomoniasis 05/31/2016   Hyperlipidemia 03/28/2016   Hypertension    NICM (nonischemic cardiomyopathy) (HCC)    Sepsis (HCC) 09/19/2013   Patient Active Problem List   Diagnosis Date Noted   Generalized weakness 05/01/2024   Ascites 04/23/2024   Seizure disorder (HCC) 04/13/2024   Acute on chronic diastolic heart failure (HCC) 03/22/2024   CAP (community acquired pneumonia) 03/22/2024   Diabetic ketoacidosis associated with type 1 diabetes mellitus (HCC) 03/16/2024   Neck abscess 02/22/2024   Sore throat 02/22/2024   HFrEF (heart failure with reduced ejection fraction) (HCC)    Chest pain 02/03/2024   Odynophagia 01/31/2024   Esophageal dysphagia 01/31/2024   Erosive esophagitis 01/31/2024   Hematemesis 01/26/2024  High anion gap metabolic acidosis 01/16/2024   Allergic rhinitis 01/16/2024   History of anemia due to chronic kidney disease 01/16/2024   Hypervolemia associated with renal insufficiency 12/19/2023   Pain and swelling of right upper extremity 12/19/2023   History of seizure disorder 11/17/2023   Hyperosmolar  hyperglycemic state (HHS) (HCC) 11/03/2023   Volume overload 11/03/2023   Vulvar pain 11/03/2023   Generalized abdominal pain 08/27/2023   Elevated LFTs 07/20/2023   Cholecystitis 06/30/2023   Prolonged QT interval 06/30/2023   Bipolar disorder (HCC) 06/30/2023   Hyperglycemic crisis due to Type 1 diabetes mellitus (HCC) 05/20/2023   Acute on chronic HFrEF (heart failure with reduced ejection fraction) (HCC) 05/20/2023   Chronic combined systolic and diastolic heart failure (HCC) 05/20/2023   Generalized pain 05/20/2023   Hypoglycemia 09/08/2022   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 difficile diarrhea 11/15/2021   SIRS (systemic inflammatory response syndrome) (HCC) 11/06/2021   LGSIL on Pap smear of cervix 09/24/2021   DKA (diabetic ketoacidosis) (HCC) 07/26/2021   Hypertension associated with diabetes (HCC) 07/26/2021   N&V (nausea and vomiting) 06/20/2021   Type 1 diabetes mellitus with chronic kidney disease on chronic dialysis (HCC) 05/26/2021   Dyslipidemia 05/26/2021   Anasarca 04/16/2021   Mild protein malnutrition 03/07/2021   DKA, type 1 (HCC) 12/08/2020   Diabetes mellitus type I (HCC) 02/08/2020   Transaminitis 10/05/2019   Leg edema, left 09/27/2019   Systolic ejection murmur 08/03/2019   Type 1 diabetes mellitus with hyperglycemia (HCC) 07/24/2019   Diabetic retinopathy (HCC) 07/02/2019   DM (diabetes mellitus), type 1 with renal complications (HCC) 05/21/2019   Diarrhea 05/31/2016   Diabetic neuropathy (HCC) 01/16/2016   Preop cardiovascular exam 08/24/2013   Pseudohyponatremia 08/04/2012   Hypokalemia  05/07/2012   Lactic acidosis 02/19/2012   Leukocytosis 02/19/2012   Normocytic anemia 02/19/2012   Hyperkalemia 02/19/2012   Hyperglycemia 11/06/2011   Stable proliferative diabetic retinopathy associated with type 1 diabetes mellitus (HCC) 10/13/1997   Home Medication(s) Prior to Admission medications  Medication Sig Start Date End Date Taking? Authorizing Provider  albuterol  (VENTOLIN  HFA) 108 (90 Base) MCG/ACT inhaler Inhale 2 puffs into the lungs every 4 (four) hours as needed for wheezing or shortness of breath. 11/24/22   [provider]  bumetanide  (BUMEX ) 2 MG tablet Take 10 mg by mouth daily.    [provider]  carvedilol  (COREG ) 25 MG tablet Take 1 tablet (25 mg total) by mouth 2 (two) times daily with a meal. Follow with your PCP for refills. 04/18/24 05/18/24  Perri DELENA Meliton Mickey., MD  Cinnamon 500 MG capsule Take 500 mg by mouth in the morning.    [provider]  diazePAM , 15 MG Dose, 2 x 7.5 MG/0.1ML LQPK Place 15 mg into the nose as needed (sz lasting over 2 minutes). 04/28/24   Yadav, Priyanka O, MD  DULoxetine  (CYMBALTA ) 20 MG capsule Take 1 capsule (20 mg total) by mouth daily. 05/04/24   Ghimire, Donalda HERO, MD  Elderberry-Vitamin C-Zinc  (ELDERBERRY IMMUNE HEALTH GUMMY PO) Take 1 tablet by mouth daily.    [provider]  fluticasone  (FLONASE ) 50 MCG/ACT  nasal spray Place 2 sprays into both nostrils daily as needed for allergies or rhinitis. 12/11/23 12/10/24  [provider]  hydrOXYzine  (ATARAX ) 25 MG tablet Take 1 tablet (25 mg total) by mouth 3 (three) times daily as needed for anxiety, itching, nausea or vomiting. 05/04/24   Ghimire, Donalda HERO, MD  insulin  aspart (NOVOLOG ) 100 UNIT/ML FlexPen Inject 2 Units into the skin 3 (three) times daily with meals. 05/10/24   Cindy Garnette POUR, MD  lacosamide  (VIMPAT ) 50 MG TABS tablet Take 2 tablets (100 mg total) by mouth 2 (two) times daily. Take additional 1 tablet (50 mg) after dialysis on  Tuesdays, Thursdays and Saturdays 05/04/24   Raenelle Donalda HERO, MD  LANTUS  SOLOSTAR 100 UNIT/ML Solostar Pen Inject 5 Units into the skin daily. 04/18/24   Perri DELENA Meliton Mickey., MD  methocarbamol  (ROBAXIN ) 500 MG tablet Take 1 tablet (500 mg total) by mouth 4 (four) times daily. 05/10/24   Cindy Garnette POUR, MD  NOVOLOG  FLEXPEN 100 UNIT/ML FlexPen Before each meal 3 times a day, 140-199 - 2 units, 200-250 - 4 units, 251-299 - 6 units,  300-349 - 7 units,  350 or above 8 units. Patient taking differently: Inject 5 Units into the skin 3 (three) times daily with meals. 03/30/24   Singh, Prashant K, MD  OLANZapine  (ZYPREXA ) 5 MG tablet Take 1 tablet (5 mg total) by mouth at bedtime. Patient not taking: Reported on 05/06/2024 05/04/24   Raenelle Donalda HERO, MD  oxyCODONE -acetaminophen  (PERCOCET/ROXICET) 5-325 MG tablet Take 1 tablet by mouth every 8 (eight) hours as needed for moderate pain (pain score 4-6). 05/10/24   Cindy Garnette POUR, MD  Pancrelipase , Lip-Prot-Amyl, 3000-9500 units CPEP Take 3,000 Units by mouth in the morning, at noon, and at bedtime.    [provider]  prochlorperazine  (COMPAZINE ) 5 MG tablet Take 5 mg by mouth every 6 (six) hours as needed for nausea or vomiting.    [provider]  sevelamer  carbonate (RENVELA ) 800 MG tablet Take 1 tablet (800 mg total) by mouth 3 (three) times daily with meals. 02/07/24   Drusilla Sabas RAMAN, MD  SUMAtriptan  (IMITREX ) 50 MG tablet Take 50 mg by mouth every 2 (two) hours as needed for migraine or headache. 06/20/22   [provider]  VELTASSA  16.8 g PACK Take 1 packet by mouth daily at 12 noon. 04/03/24   [provider]                                                                                                                                    Allergies Keflex  [cephalexin ], Penicillins, Vibramycin  [doxycycline ], Benadryl  [diphenhydramine ], Entresto  [sacubitril -valsartan ], Dilaudid  [hydromorphone ], Methotrexate and  trimetrexate, and Roxicodone  [oxycodone ]  Review of Systems Review of Systems  Constitutional:  Positive for fatigue.  Gastrointestinal:  Negative for nausea and vomiting.   As noted in HPI  Physical Exam Vital Signs  I have reviewed the triage vital  signs BP (!) 178/106   Pulse 88   Temp 98.4 F (36.9 C) (Oral)   Resp (!) 21   Ht 5' 2 (1.575 m)   Wt 63.2 kg   SpO2 100%   BMI 25.48 kg/m   Physical Exam Vitals reviewed.  Constitutional:      General: She is not in acute distress.    Appearance: She is well-developed. She is ill-appearing (chronically). She is not diaphoretic.  HENT:     Head: Normocephalic and atraumatic.     Right Ear: External ear normal.     Left Ear: External ear normal.     Nose: Nose normal.  Eyes:     General: No scleral icterus.    Conjunctiva/sclera: Conjunctivae normal.  Neck:     Trachea: Phonation normal.  Cardiovascular:     Rate and Rhythm: Normal rate and regular rhythm.     Arteriovenous access: Right arteriovenous access is present.  Pulmonary:     Effort: Pulmonary effort is normal. No respiratory distress.     Breath sounds: No stridor.  Abdominal:     General: There is no distension.  Musculoskeletal:        General: Normal range of motion.     Cervical back: Normal range of motion.  Neurological:     Mental Status: She is alert and oriented to person, place, and time.  Psychiatric:        Behavior: Behavior normal.     ED Results and Treatments Labs (all labs ordered are listed, but only abnormal results are displayed) Labs Reviewed  CBC - Abnormal; Notable for the following components:      Result Value   RBC 3.29 (*)    Hemoglobin 8.5 (*)    HCT 29.2 (*)    MCH 25.8 (*)    MCHC 29.1 (*)    All other components within normal limits  BASIC METABOLIC PANEL WITH GFR - Abnormal; Notable for the following components:   CO2 20 (*)    Glucose, Bld 838 (*)    BUN 52 (*)    Creatinine, Ser 6.33 (*)    Calcium  8.7  (*)    GFR, Estimated 8 (*)    Anion gap 16 (*)    All other components within normal limits  BETA-HYDROXYBUTYRIC ACID - Abnormal; Notable for the following components:   Beta-Hydroxybutyric Acid 0.82 (*)    All other components within normal limits  CBG MONITORING, ED - Abnormal; Notable for the following components:   Glucose-Capillary >600 (*)    All other components within normal limits  I-STAT CHEM 8, ED - Abnormal; Notable for the following components:   Sodium 134 (*)    Potassium 5.5 (*)    BUN 60 (*)    Creatinine, Ser 6.20 (*)    Glucose, Bld >700 (*)    Calcium , Ion 1.11 (*)    Hemoglobin 9.9 (*)    HCT 29.0 (*)    All other components within normal limits  CBG MONITORING, ED - Abnormal; Notable for the following components:   Glucose-Capillary >600 (*)    All other components within normal limits  I-STAT VENOUS BLOOD GAS, ED - Abnormal; Notable for the following components:   pO2, Ven 93 (*)    Acid-base deficit 5.0 (*)    HCT 32.0 (*)    Hemoglobin 10.9 (*)    All other components within normal limits  CBG MONITORING, ED - Abnormal; Notable for the following components:   Glucose-Capillary >  600 (*)    All other components within normal limits  CBG MONITORING, ED - Abnormal; Notable for the following components:   Glucose-Capillary 542 (*)    All other components within normal limits  CBG MONITORING, ED - Abnormal; Notable for the following components:   Glucose-Capillary 537 (*)    All other components within normal limits  HCG, SERUM, QUALITATIVE  URINALYSIS, ROUTINE W REFLEX MICROSCOPIC  BASIC METABOLIC PANEL WITH GFR  CBG MONITORING, ED                                                                                                                         EKG  EKG Interpretation Date/Time:    Ventricular Rate:    PR Interval:    QRS Duration:    QT Interval:    QTC Calculation:   R Axis:      Text Interpretation:         Radiology DG Chest  Port 1 View Result Date: 05/12/2024 EXAM: 1 VIEW(S) XRAY OF THE CHEST 05/12/2024 12:16:48 AM COMPARISON: 05/06/2024 CLINICAL HISTORY: Hyperglycemia FINDINGS: LUNGS AND PLEURA: Low lung volumes. No focal pulmonary opacity. No pleural effusion. No pneumothorax. HEART AND MEDIASTINUM: Cardiomegaly. BONES AND SOFT TISSUES: No acute osseous abnormality. IMPRESSION: 1. Cardiomegaly. Electronically signed by: Oneil Devonshire MD MD 05/12/2024 12:25 AM EST RP Workstation: [id number]    Medications Ordered in ED Medications  insulin  regular, human (MYXREDLIN ) 100 units/ 100 mL infusion (7 Units/hr Intravenous Rate/Dose Change 05/12/24 0319)  lactated ringers  infusion ( Intravenous New Bag/Given 05/12/24 0048)  dextrose  5 % in lactated ringers  infusion (0 mLs Intravenous Hold 05/12/24 0052)  dextrose  50 % solution 0-50 mL (has no administration in time range)  acetaminophen  (TYLENOL ) tablet 1,000 mg (1,000 mg Oral Given 05/12/24 0014)  prochlorperazine  (COMPAZINE ) injection 10 mg (10 mg Intravenous Given 05/12/24 0313)   Procedures .Critical Care  Performed by: Trine Raynell Moder, MD Authorized by: Trine Raynell Moder, MD   Critical care provider statement:    Critical care time (minutes):  45   Critical care time was exclusive of:  Separately billable procedures and treating other patients   Critical care was necessary to treat or prevent imminent or life-threatening deterioration of the following conditions: hyperglycemia >800.   Critical care was time spent personally by me on the following activities:  Development of treatment plan with patient or surrogate, discussions with consultants, evaluation of patient's response to treatment, examination of patient, obtaining history from patient or surrogate, review of old charts, re-evaluation of patient's condition, pulse oximetry, ordering and review of radiographic studies, ordering and review of laboratory studies and ordering and performing treatments and  interventions   (including critical care time)   This chart was dictated using voice recognition software.  Despite best efforts to proofread,  errors can occur which can change the documentation meaning.   Trine Raynell Moder, MD 05/12/24 [phone]  "

## 2024-05-12 NOTE — ED Notes (Signed)
 PT requesting for IV to be removed. Pt states Why would I want to stay here, y'all aren't doing nothing. Pt instructed that she actively has Insulin  infusion and we are attempting to get BG down. Pt states I can take insulin  at home. NT Apolinar removed IV and wheeled pt to lobby. Dr Trine notified of pt leaving AMA.

## 2024-05-13 ENCOUNTER — Other Ambulatory Visit (HOSPITAL_COMMUNITY): Payer: Self-pay

## 2024-05-18 NOTE — Procedures (Signed)
 ------------------------------------------------------------------------------- Attestation signed by Laron Belvie Carrier, MD at 05/18/24 1125 ATTENDING PHYSICIAN  I saw the patient while dialyzing with the resident. I discussed the findings, assessment and plan with the resident and agree with the findings and plan as documented in the resident's note.   Belvie Laron, MD, PhD Division of Nephrology and Hypertension  Pager: [phone]   -------------------------------------------------------------------------------  [facility] Nephrology Hemodialysis Procedure Note   05/18/2024  Ashley Freeman [place] was seen and examined on hemodialysis.  CHIEF COMPLAINT: End Stage Kidney Disease  INTERVAL HISTORY: Shortened treatment today due to plan for discharge today. No acute complaints, tolerating treatment well.   DIALYSIS TREATMENT DATA: Estimated Dry Weight (kg): 57.5 kg (126 lb 12.2 oz) Patient Goal Weight (kg): 2 kg (4 lb 6.6 oz) (as tolerates for 2 hr tx)  Pre-Treatment Weight (kg): 68.5 kg (151 lb 0.2 oz)   Dialysis Bath Bath: 2 K+ / 2.5 Ca+ Dialysate Na (mEq/L): 137 mEq/L Dialysate HCO3 (mEq/L): 35 mEq/L Dialyzer: F-180 (98 mLs)  Blood Flow Rate (mL/min): 400 mL/min Dialysis Flow (mL/min): 800 mL/min  Machine Temperature (C): 36.5 C (97.7 F)    PHYSICAL EXAM: Vitals: Temp:  [36.6 C (97.9 F)-36.8 C (98.2 F)] 36.6 C (97.9 F) Pulse:  [69-74] 73 BP: (125-159)/(67-111) 159/109 MAP (mmHg):  [118-122] 118  General: fatigued, currently dialyzing in a Hemodialysis Recliner Pulmonary: normal respiratory effort Cardiovascular: warm extremities Extremities: trace  edema Access: LUE AV fistula  LAB DATA: Lab Results  Component Value Date   NA 145 05/18/2024   K 4.2 05/18/2024   CL 108 (H) 05/18/2024   CO2 24.0 05/18/2024   BUN 32 (H) 05/18/2024   CREATININE 5.60 (H) 05/18/2024   CALCIUM  7.5 (L) 05/18/2024   MG 2.1 05/18/2024   PHOS 6.2 (H) 05/18/2024   ALBUMIN  2.5  (L) 05/18/2024    Lab Results  Component Value Date   HCT 27.4 (L) 05/18/2024   WBC 9.4 05/18/2024     ASSESSMENT/PLAN: End Stage Renal Disease on Intermittent Hemodialysis: UF goal: 3L as tolerated.  Adjust medications for a GFR <10 Avoid nephrotoxic agents Last HD Treatment:Started (05/18/24)   Bone Mineral Metabolism: Lab Results  Component Value Date   CALCIUM  7.5 (L) 05/18/2024   CALCIUM  7.9 (L) 05/16/2024   Lab Results  Component Value Date   ALBUMIN  2.5 (L) 05/18/2024   ALBUMIN  2.4 (L) 05/16/2024    Lab Results  Component Value Date   PHOS 6.2 (H) 05/18/2024   PHOS 3.9 05/16/2024   Lab Results  Component Value Date   PTH 227.3 (H) 09/16/2023   PTH 266.3 (H) 02/26/2022    Labs appropriate, no changes.  Anemia:  Lab Results  Component Value Date   HGB 9.0 (L) 05/18/2024   HGB 9.0 (L) 05/16/2024   HGB 8.6 (L) 05/15/2024   Iron  Saturation (%)  Date Value Ref Range Status  05/13/2024 17 (L) 20 - 55 % Final    Lab Results  Component Value Date   FERRITIN 430.4 (H) 09/16/2023     - Need to review records for home ESA - If remains admitted, start IV iron  with next treatment   Vascular Access: Vascular Access functioning well - no need for intervention Blood Flow Rate (mL/min): 400 mL/min  IV Antibiotics to be administered at discharge: TBD  This procedure was fully reviewed with the patient and/or their decision-maker. The risks, benefits, and alternatives were discussed prior to the procedure. All questions were answered and written informed consent was obtained.  Chiquita Pleva, MD [facility] Division of Nephrology & Hypertension

## 2024-05-18 NOTE — Telephone Encounter (Signed)
 Paged by [facility] pharmacy regarding this patient's discharge medications. Gave permission for the following prescriptions:  ReliOn Novolog  Flex Pen - 15 mL box, 2 refills Basaglar  - 15 mL box, 2 refills  Recommended discharging team discuss prior authorization with pharmacy and alert me if they have difficulties with prior authorization. If this is the case, I will request the Endocrinology prior authorization team to fill out the necessary paperwork.   Georgia Jury, MD

## 2024-05-19 NOTE — Care Plan (Signed)
 Transition of Care Encounter Data   Call attempt: 1 Admission date: 05/12/24 Discharge date: 05/16/24 Discharge diagnosis: Hyperglycemia due to type 1 diabetes mellitus Do you have a hospital follow up appointment?: Yes with Specialty Provider clinic, Yes - Within 14 Days F/U Date: 05/21/24 F/U Provider: NEW GENERAL PCP Rfl Status: Closed [facility] ([facility]) Lupu, Aloha Sensing, MD Patient post discharge: Medications:         [facility]: [phone] [id number] [facility]: [phone]:    Other: Contact PCP:       [facility] TRANSITIONS OF CARE SUMMARY NOTE   Attempted to contact pt today at Home to complete Transitions of Care call for the [facility]. No answer/unable to leave message; 1st attempt  Roma Sharps, RN

## 2024-05-20 ENCOUNTER — Emergency Department (HOSPITAL_COMMUNITY)

## 2024-05-20 ENCOUNTER — Encounter (HOSPITAL_COMMUNITY): Payer: Self-pay | Admitting: Emergency Medicine

## 2024-05-20 ENCOUNTER — Other Ambulatory Visit: Payer: Self-pay

## 2024-05-20 ENCOUNTER — Inpatient Hospital Stay (HOSPITAL_COMMUNITY)
Admission: EM | Admit: 2024-05-20 | Discharge: 2024-05-23 | DRG: 637 | Disposition: A | Attending: Internal Medicine | Admitting: Internal Medicine

## 2024-05-20 DIAGNOSIS — I152 Hypertension secondary to endocrine disorders: Secondary | ICD-10-CM | POA: Diagnosis present

## 2024-05-20 DIAGNOSIS — I132 Hypertensive heart and chronic kidney disease with heart failure and with stage 5 chronic kidney disease, or end stage renal disease: Secondary | ICD-10-CM | POA: Diagnosis present

## 2024-05-20 DIAGNOSIS — Z992 Dependence on renal dialysis: Secondary | ICD-10-CM

## 2024-05-20 DIAGNOSIS — M542 Cervicalgia: Secondary | ICD-10-CM | POA: Diagnosis present

## 2024-05-20 DIAGNOSIS — E876 Hypokalemia: Secondary | ICD-10-CM | POA: Diagnosis not present

## 2024-05-20 DIAGNOSIS — N186 End stage renal disease: Secondary | ICD-10-CM | POA: Diagnosis present

## 2024-05-20 DIAGNOSIS — F319 Bipolar disorder, unspecified: Secondary | ICD-10-CM | POA: Diagnosis present

## 2024-05-20 DIAGNOSIS — I5043 Acute on chronic combined systolic (congestive) and diastolic (congestive) heart failure: Secondary | ICD-10-CM | POA: Diagnosis present

## 2024-05-20 DIAGNOSIS — Z833 Family history of diabetes mellitus: Secondary | ICD-10-CM | POA: Diagnosis not present

## 2024-05-20 DIAGNOSIS — E8770 Fluid overload, unspecified: Secondary | ICD-10-CM

## 2024-05-20 DIAGNOSIS — E1010 Type 1 diabetes mellitus with ketoacidosis without coma: Principal | ICD-10-CM | POA: Diagnosis present

## 2024-05-20 DIAGNOSIS — D631 Anemia in chronic kidney disease: Secondary | ICD-10-CM | POA: Diagnosis present

## 2024-05-20 DIAGNOSIS — I071 Rheumatic tricuspid insufficiency: Secondary | ICD-10-CM | POA: Diagnosis present

## 2024-05-20 DIAGNOSIS — E10649 Type 1 diabetes mellitus with hypoglycemia without coma: Secondary | ICD-10-CM | POA: Diagnosis present

## 2024-05-20 DIAGNOSIS — I10 Essential (primary) hypertension: Secondary | ICD-10-CM | POA: Diagnosis not present

## 2024-05-20 DIAGNOSIS — Z5982 Transportation insecurity: Secondary | ICD-10-CM

## 2024-05-20 DIAGNOSIS — Z794 Long term (current) use of insulin: Secondary | ICD-10-CM | POA: Diagnosis not present

## 2024-05-20 DIAGNOSIS — G8929 Other chronic pain: Secondary | ICD-10-CM | POA: Diagnosis present

## 2024-05-20 DIAGNOSIS — Z888 Allergy status to other drugs, medicaments and biological substances status: Secondary | ICD-10-CM | POA: Diagnosis not present

## 2024-05-20 DIAGNOSIS — E785 Hyperlipidemia, unspecified: Secondary | ICD-10-CM | POA: Diagnosis present

## 2024-05-20 DIAGNOSIS — I5033 Acute on chronic diastolic (congestive) heart failure: Secondary | ICD-10-CM | POA: Diagnosis not present

## 2024-05-20 DIAGNOSIS — G40909 Epilepsy, unspecified, not intractable, without status epilepticus: Secondary | ICD-10-CM | POA: Diagnosis present

## 2024-05-20 DIAGNOSIS — Z91128 Patient's intentional underdosing of medication regimen for other reason: Secondary | ICD-10-CM

## 2024-05-20 DIAGNOSIS — Z885 Allergy status to narcotic agent status: Secondary | ICD-10-CM

## 2024-05-20 DIAGNOSIS — T383X6A Underdosing of insulin and oral hypoglycemic [antidiabetic] drugs, initial encounter: Secondary | ICD-10-CM | POA: Diagnosis present

## 2024-05-20 DIAGNOSIS — E875 Hyperkalemia: Secondary | ICD-10-CM | POA: Diagnosis present

## 2024-05-20 DIAGNOSIS — I428 Other cardiomyopathies: Secondary | ICD-10-CM | POA: Diagnosis present

## 2024-05-20 DIAGNOSIS — Z79899 Other long term (current) drug therapy: Secondary | ICD-10-CM

## 2024-05-20 DIAGNOSIS — Z825 Family history of asthma and other chronic lower respiratory diseases: Secondary | ICD-10-CM

## 2024-05-20 DIAGNOSIS — Z880 Allergy status to penicillin: Secondary | ICD-10-CM | POA: Diagnosis not present

## 2024-05-20 DIAGNOSIS — E1022 Type 1 diabetes mellitus with diabetic chronic kidney disease: Secondary | ICD-10-CM | POA: Diagnosis present

## 2024-05-20 DIAGNOSIS — Z881 Allergy status to other antibiotic agents status: Secondary | ICD-10-CM | POA: Diagnosis not present

## 2024-05-20 DIAGNOSIS — R601 Generalized edema: Secondary | ICD-10-CM

## 2024-05-20 LAB — I-STAT VENOUS BLOOD GAS, ED
Acid-base deficit: 18 mmol/L — ABNORMAL HIGH (ref 0.0–2.0)
Bicarbonate: 9 mmol/L — ABNORMAL LOW (ref 20.0–28.0)
Calcium, Ion: 0.88 mmol/L — CL (ref 1.15–1.40)
HCT: 29 % — ABNORMAL LOW (ref 36.0–46.0)
Hemoglobin: 9.9 g/dL — ABNORMAL LOW (ref 12.0–15.0)
O2 Saturation: 99 %
Potassium: 5 mmol/L (ref 3.5–5.1)
Sodium: 130 mmol/L — ABNORMAL LOW (ref 135–145)
TCO2: 10 mmol/L — ABNORMAL LOW (ref 22–32)
pCO2, Ven: 24.1 mmHg — ABNORMAL LOW (ref 44–60)
pH, Ven: 7.178 — CL (ref 7.25–7.43)
pO2, Ven: 199 mmHg — ABNORMAL HIGH (ref 32–45)

## 2024-05-20 LAB — BASIC METABOLIC PANEL WITH GFR
Anion gap: 35 — ABNORMAL HIGH (ref 5–15)
Anion gap: 32 — ABNORMAL HIGH (ref 5–15)
BUN: 47 mg/dL — ABNORMAL HIGH (ref 6–20)
BUN: 48 mg/dL — ABNORMAL HIGH (ref 6–20)
CO2: 8 mmol/L — ABNORMAL LOW (ref 22–32)
CO2: 9 mmol/L — ABNORMAL LOW (ref 22–32)
Calcium: 7.1 mg/dL — ABNORMAL LOW (ref 8.9–10.3)
Calcium: 7 mg/dL — ABNORMAL LOW (ref 8.9–10.3)
Chloride: 91 mmol/L — ABNORMAL LOW (ref 98–111)
Chloride: 94 mmol/L — ABNORMAL LOW (ref 98–111)
Creatinine, Ser: 6.23 mg/dL — ABNORMAL HIGH (ref 0.44–1.00)
Creatinine, Ser: 6.36 mg/dL — ABNORMAL HIGH (ref 0.44–1.00)
GFR, Estimated: 9 mL/min — ABNORMAL LOW
GFR, Estimated: 8 mL/min — ABNORMAL LOW
Glucose, Bld: 759 mg/dL (ref 70–99)
Glucose, Bld: 643 mg/dL (ref 70–99)
Potassium: 5.2 mmol/L — ABNORMAL HIGH (ref 3.5–5.1)
Potassium: 4.6 mmol/L (ref 3.5–5.1)
Sodium: 134 mmol/L — ABNORMAL LOW (ref 135–145)
Sodium: 135 mmol/L (ref 135–145)

## 2024-05-20 LAB — CBG MONITORING, ED
Glucose-Capillary: 394 mg/dL — ABNORMAL HIGH (ref 70–99)
Glucose-Capillary: 480 mg/dL — ABNORMAL HIGH (ref 70–99)
Glucose-Capillary: 496 mg/dL — ABNORMAL HIGH (ref 70–99)
Glucose-Capillary: 523 mg/dL (ref 70–99)
Glucose-Capillary: 533 mg/dL (ref 70–99)
Glucose-Capillary: 546 mg/dL (ref 70–99)
Glucose-Capillary: 588 mg/dL (ref 70–99)
Glucose-Capillary: >600 mg/dL (ref 70–99)
Glucose-Capillary: >600 mg/dL (ref 70–99)
Glucose-Capillary: >600 mg/dL (ref 70–99)

## 2024-05-20 LAB — BETA-HYDROXYBUTYRIC ACID
Beta-Hydroxybutyric Acid: >8 mmol/L — ABNORMAL HIGH (ref 0.05–0.27)
Beta-Hydroxybutyric Acid: 6.32 mmol/L — ABNORMAL HIGH (ref 0.05–0.27)

## 2024-05-20 LAB — I-STAT CHEM 8, ED
BUN: 41 mg/dL — ABNORMAL HIGH (ref 6–20)
Calcium, Ion: 0.89 mmol/L — CL (ref 1.15–1.40)
Chloride: 102 mmol/L (ref 98–111)
Creatinine, Ser: 6.6 mg/dL — ABNORMAL HIGH (ref 0.44–1.00)
Glucose, Bld: >700 mg/dL (ref 70–99)
HCT: 30 % — ABNORMAL LOW (ref 36.0–46.0)
Hemoglobin: 10.2 g/dL — ABNORMAL LOW (ref 12.0–15.0)
Potassium: 5 mmol/L (ref 3.5–5.1)
Sodium: 132 mmol/L — ABNORMAL LOW (ref 135–145)
TCO2: 11 mmol/L — ABNORMAL LOW (ref 22–32)

## 2024-05-20 LAB — CBC WITH DIFFERENTIAL/PLATELET
Abs Immature Granulocytes: 0.06 K/uL (ref 0.00–0.07)
Basophils Absolute: 0.1 K/uL (ref 0.0–0.1)
Basophils Relative: 1 %
Eosinophils Absolute: 0.6 K/uL — ABNORMAL HIGH (ref 0.0–0.5)
Eosinophils Relative: 6 %
HCT: 29.1 % — ABNORMAL LOW (ref 36.0–46.0)
Hemoglobin: 8.4 g/dL — ABNORMAL LOW (ref 12.0–15.0)
Immature Granulocytes: 1 %
Lymphocytes Relative: 33 %
Lymphs Abs: 3.4 K/uL (ref 0.7–4.0)
MCH: 26.7 pg (ref 26.0–34.0)
MCHC: 28.9 g/dL — ABNORMAL LOW (ref 30.0–36.0)
MCV: 92.4 fL (ref 80.0–100.0)
Monocytes Absolute: 0.9 K/uL (ref 0.1–1.0)
Monocytes Relative: 9 %
Neutro Abs: 5.4 K/uL (ref 1.7–7.7)
Neutrophils Relative %: 50 %
Platelets: 300 K/uL (ref 150–400)
RBC: 3.15 MIL/uL — ABNORMAL LOW (ref 3.87–5.11)
RDW: 15.9 % — ABNORMAL HIGH (ref 11.5–15.5)
WBC: 10.4 K/uL (ref 4.0–10.5)
nRBC: 0 % (ref 0.0–0.2)

## 2024-05-20 LAB — CBC
HCT: 27.4 % — ABNORMAL LOW (ref 36.0–46.0)
Hemoglobin: 8.2 g/dL — ABNORMAL LOW (ref 12.0–15.0)
MCH: 26.4 pg (ref 26.0–34.0)
MCHC: 29.9 g/dL — ABNORMAL LOW (ref 30.0–36.0)
MCV: 88.1 fL (ref 80.0–100.0)
Platelets: 305 K/uL (ref 150–400)
RBC: 3.11 MIL/uL — ABNORMAL LOW (ref 3.87–5.11)
RDW: 15.8 % — ABNORMAL HIGH (ref 11.5–15.5)
WBC: 11.3 K/uL — ABNORMAL HIGH (ref 4.0–10.5)
nRBC: 0 % (ref 0.0–0.2)

## 2024-05-20 LAB — HCG, SERUM, QUALITATIVE: Preg, Serum: NEGATIVE

## 2024-05-20 LAB — TROPONIN T, HIGH SENSITIVITY
Troponin T High Sensitivity: 497 ng/L (ref 0–19)
Troponin T High Sensitivity: 454 ng/L (ref 0–19)

## 2024-05-20 LAB — HEPATITIS B SURFACE ANTIGEN: Hepatitis B Surface Ag: NONREACTIVE

## 2024-05-20 LAB — PRO BRAIN NATRIURETIC PEPTIDE: Pro Brain Natriuretic Peptide: >35000 pg/mL — ABNORMAL HIGH

## 2024-05-20 MED ORDER — LACTATED RINGERS IV BOLUS: 20.0000 mL/kg | Freq: Once | INTRAVENOUS | Status: DC

## 2024-05-20 MED ORDER — FENTANYL CITRATE (PF) 50 MCG/ML IJ SOSY
50.0000 ug | PREFILLED_SYRINGE | Freq: Once | INTRAMUSCULAR | Status: AC
  Administered 2024-05-20: 50 ug via INTRAVENOUS
  Filled 2024-05-20: qty 1

## 2024-05-20 MED ORDER — DEXTROSE IN LACTATED RINGERS 5 % IV SOLN: INTRAVENOUS | Status: DC

## 2024-05-20 MED ORDER — LACTATED RINGERS IV SOLN: INTRAVENOUS | Status: DC

## 2024-05-20 MED ORDER — INSULIN ASPART 100 UNIT/ML IJ SOLN
2.0000 [IU] | Freq: Once | INTRAMUSCULAR | Status: AC
  Administered 2024-05-20: 2 [IU] via SUBCUTANEOUS
  Filled 2024-05-20: qty 2

## 2024-05-20 MED ORDER — HEPARIN SODIUM (PORCINE) 5000 UNIT/ML IJ SOLN
5000.0000 [IU] | Freq: Three times a day (TID) | INTRAMUSCULAR | Status: DC
  Administered 2024-05-20: 5000 [IU] via SUBCUTANEOUS
  Filled 2024-05-20 (×3): qty 1

## 2024-05-20 MED ORDER — MORPHINE SULFATE (PF) 2 MG/ML IV SOLN
2.0000 mg | INTRAVENOUS | Status: DC | PRN
  Administered 2024-05-20 – 2024-05-21 (×3): 2 mg via INTRAVENOUS
  Filled 2024-05-20 (×3): qty 1

## 2024-05-20 MED ORDER — INSULIN REGULAR(HUMAN) IN NACL 100-0.9 UT/100ML-% IV SOLN
INTRAVENOUS | Status: DC
  Filled 2024-05-20: qty 100

## 2024-05-20 MED ORDER — DEXTROSE 50 % IV SOLN
0.0000 mL | INTRAVENOUS | Status: DC | PRN
  Administered 2024-05-21: 35 mL via INTRAVENOUS
  Administered 2024-05-21: 50 mL via INTRAVENOUS
  Filled 2024-05-20 (×3): qty 50

## 2024-05-20 MED ORDER — CHLORHEXIDINE GLUCONATE CLOTH 2 % EX PADS
6.0000 | MEDICATED_PAD | Freq: Every day | CUTANEOUS | Status: DC
  Administered 2024-05-22: 6 via TOPICAL

## 2024-05-20 MED ORDER — INSULIN REGULAR(HUMAN) IN NACL 100-0.9 UT/100ML-% IV SOLN
INTRAVENOUS | Status: DC
  Administered 2024-05-20 (×2): 6 [IU]/h via INTRAVENOUS
  Filled 2024-05-20: qty 100

## 2024-05-20 MED ORDER — POTASSIUM CHLORIDE 10 MEQ/100ML IV SOLN
10.0000 meq | INTRAVENOUS | Status: AC
  Administered 2024-05-20 (×2): 10 meq via INTRAVENOUS
  Filled 2024-05-20: qty 100

## 2024-05-20 NOTE — H&P (Signed)
 " History and Physical    Patient: Ashley Freeman FMW:[id number] DOB: [birth year] DOA: 05/20/2024 DOS: the patient was seen and examined on 05/20/2024 PCP: Keven Crumbly Pap, MD  Patient coming from: Home  Chief Complaint:  Chief Complaint  Patient presents with   Facial Swelling   HPI: Ashley Freeman is a 34 y.o. female with medical history significant of type 1 diabetes, bipolar disorder, recurrent admissions with DKA who was just discharged at least twice from this hospital with DKA and went to [facility] where she was again admitted and discharged on the 13th with DKA here with complaint of edema all over her body.  She had hemodialysis on Sunday and also Monday.  Her initial blood glucose here was found to be 759.  With a pH of 7.178, hyperkalemia as well as beta-hydroxybutyrate acid of more than 8.  She does have proBNP more than 35,000 with elevated troponin at 497.  Her anion gap was 35 consistent with diabetic ketoacidosis.  Patient being admitted with DKA type I.  She is well-known to have issues with noncompliance and recurrent DKA.  Will therefore admit the patient for further evaluation and treatment.  Review of Systems: As mentioned in the history of present illness. All other systems reviewed and are negative. Past Medical History:  Diagnosis Date   Abscess, gluteal, right 08/24/2013   Anemia 02/19/2012   Bartholin's gland abscess 09/19/2013   Bipolar disorder (HCC)    BV (bacterial vaginosis) 11/24/2015   Depression    Diabetes mellitus type I (HCC) 2001   Diagnosed at age 56 ; Type I   Diarrhea 05/30/2016   DKA (diabetic ketoacidoses) 08/19/2013   Also in 2018   ESRD (end stage renal disease) (HCC)    Gonorrhea 08/2011   Treated in 09/2011   HFrEF (heart failure with reduced ejection fraction) (HCC)    a. 2022 Echo: EF 40%; b. 10/2021 Echo: EF 55%; b. 07/2022 MV: No ischemia. EF 31%; c. 08/2022 Echo: EF 35%, mildly dil RV, sev TR.   History of  trichomoniasis 05/31/2016   Hyperlipidemia 03/28/2016   Hypertension    NICM (nonischemic cardiomyopathy) (HCC)    Sepsis (HCC) 09/19/2013   Past Surgical History:  Procedure Laterality Date   A/V FISTULAGRAM Right 06/17/2023   Procedure: A/V Fistulagram;  Surgeon: Marea Selinda GORMAN, MD;  Location: [facility] INVASIVE CV LAB;  Service: Cardiovascular;  Laterality: Right;   A/V SHUNT INTERVENTION N/A 09/25/2023   Procedure: A/V SHUNT INTERVENTION;  Surgeon: Pearline Norman GORMAN, MD;  Location: [facility] PV LAB;  Service: Cardiovascular;  Laterality: N/A;   AV FISTULA PLACEMENT Right 07/06/2022   Procedure: ARTERIOVENOUS GRAFT CREATION;  Surgeon: Gretta Lonni PARAS, MD;  Location: [facility] OR;  Service: Vascular;  Laterality: Right;   CESAREAN SECTION N/A 10/05/2019   Procedure: CESAREAN SECTION;  Surgeon: Izell Harari, MD;  Location: [facility] LD ORS;  Service: Obstetrics;  Laterality: N/A;   CHOLECYSTECTOMY N/A 07/02/2023   Procedure: LAPAROSCOPIC CHOLECYSTECTOMY;  Surgeon: Ebbie Cough, MD;  Location: [facility] OR;  Service: General;  Laterality: N/A;   ESOPHAGOGASTRODUODENOSCOPY N/A 01/31/2024   Procedure: EGD (ESOPHAGOGASTRODUODENOSCOPY);  Surgeon: San Sandor GAILS, DO;  Location: [facility] ENDOSCOPY;  Service: Gastroenterology;  Laterality: N/A;   INCISION AND DRAINAGE ABSCESS Left 09/28/2019   Procedure: INCISION AND DRAINAGE VULVAR ABCESS;  Surgeon: Edsel Norleen GAILS, MD;  Location: [facility] OR;  Service: Gynecology;  Laterality: Left;   INCISION AND DRAINAGE ABSCESS Right 02/23/2024   Procedure: INCISION AND DRAINAGE, ABSCESS;  Surgeon: Tobie,  Eldora NOVAK, MD;  Location: [facility] OR;  Service: ENT;  Laterality: Right;   INCISION AND DRAINAGE PERIRECTAL ABSCESS Right 08/18/2013   Procedure: IRRIGATION AND DEBRIDEMENT GLUTEAL ABSCESS;  Surgeon: Lynda Leos, MD;  Location: [facility] OR;  Service: General;  Laterality: Right;   INCISION AND DRAINAGE PERIRECTAL ABSCESS Right 09/19/2013   Procedure: IRRIGATION AND DEBRIDEMENT RIGHT GLUTEAL  AND LABIAL ABSCESSES;  Surgeon: Lynda Leos, MD;  Location: [facility] OR;  Service: General;  Laterality: Right;   INCISION AND DRAINAGE PERIRECTAL ABSCESS Right 09/24/2013   Procedure: IRRIGATION AND DEBRIDEMENT PERIRECTAL ABSCESS;  Surgeon: Lynwood MALVA Pina, MD;  Location: [facility] OR;  Service: General;  Laterality: Right;   IR PARACENTESIS  08/28/2023   IR PARACENTESIS  11/04/2023   IR PARACENTESIS  12/23/2023   IR PARACENTESIS  02/04/2024   IR PARACENTESIS  03/23/2024   IR PARACENTESIS  04/06/2024   Social History:  reports that she has never smoked. She has never been exposed to tobacco smoke. She has never used smokeless tobacco. She reports that she does not currently use alcohol . She reports that she does not use drugs.  Allergies[1]  Family History  Problem Relation Age of Onset   Asthma Mother    Carpal tunnel syndrome Mother    Gout Father    Diabetes Paternal Grandmother    Anesthesia problems Neg Hx     Prior to Admission medications  Medication Sig Start Date End Date Taking? Authorizing Provider  albuterol  (VENTOLIN  HFA) 108 (90 Base) MCG/ACT inhaler Inhale 2 puffs into the lungs every 4 (four) hours as needed for wheezing or shortness of breath. 11/24/22   [provider]  bumetanide  (BUMEX ) 2 MG tablet Take 10 mg by mouth daily.    [provider]  carvedilol  (COREG ) 25 MG tablet Take 1 tablet (25 mg total) by mouth 2 (two) times daily with a meal. Follow with your PCP for refills. 04/18/24 05/18/24  Perri DELENA Meliton Mickey., MD  Cinnamon 500 MG capsule Take 500 mg by mouth in the morning.    [provider]  diazePAM , 15 MG Dose, 2 x 7.5 MG/0.1ML LQPK Place 15 mg into the nose as needed (sz lasting over 2 minutes). 04/28/24   Yadav, Priyanka O, MD  DULoxetine  (CYMBALTA ) 20 MG capsule Take 1 capsule (20 mg total) by mouth daily. 05/04/24   Ghimire, Donalda HERO, MD  Elderberry-Vitamin C-Zinc  (ELDERBERRY IMMUNE HEALTH GUMMY PO) Take 1 tablet by mouth daily.     [provider]  fluticasone  (FLONASE ) 50 MCG/ACT nasal spray Place 2 sprays into both nostrils daily as needed for allergies or rhinitis. 12/11/23 12/10/24  [provider]  hydrOXYzine  (ATARAX ) 25 MG tablet Take 1 tablet (25 mg total) by mouth 3 (three) times daily as needed for anxiety, itching, nausea or vomiting. 05/04/24   Ghimire, Donalda HERO, MD  insulin  aspart (NOVOLOG ) 100 UNIT/ML FlexPen Inject 2 Units into the skin 3 (three) times daily with meals. 05/10/24   Cindy Garnette POUR, MD  lacosamide  (VIMPAT ) 50 MG TABS tablet Take 2 tablets (100 mg total) by mouth 2 (two) times daily. Take additional 1 tablet (50 mg) after dialysis on Tuesdays, Thursdays and Saturdays 05/04/24   Raenelle Donalda HERO, MD  LANTUS  SOLOSTAR 100 UNIT/ML Solostar Pen Inject 5 Units into the skin daily. 04/18/24   Perri DELENA Meliton Mickey., MD  methocarbamol  (ROBAXIN ) 500 MG tablet Take 1 tablet (500 mg total) by mouth 4 (four) times daily. 05/10/24   Cindy Garnette POUR, MD  NOVOLOG  FLEXPEN 100 UNIT/ML FlexPen Before each meal 3 times a day, 140-199 - 2 units, 200-250 - 4 units, 251-299 - 6 units,  300-349 - 7 units,  350 or above 8 units. Patient taking differently: Inject 5 Units into the skin 3 (three) times daily with meals. 03/30/24   Singh, Prashant K, MD  OLANZapine  (ZYPREXA ) 5 MG tablet Take 1 tablet (5 mg total) by mouth at bedtime. Patient not taking: Reported on 05/06/2024 05/04/24   Raenelle Donalda HERO, MD  oxyCODONE -acetaminophen  (PERCOCET/ROXICET) 5-325 MG tablet Take 1 tablet by mouth every 8 (eight) hours as needed for moderate pain (pain score 4-6). 05/10/24   Cindy Garnette POUR, MD  Pancrelipase , Lip-Prot-Amyl, 3000-9500 units CPEP Take 3,000 Units by mouth in the morning, at noon, and at bedtime.    [provider]  prochlorperazine  (COMPAZINE ) 5 MG tablet Take 5 mg by mouth every 6 (six) hours as needed for nausea or vomiting.    [provider]  sevelamer  carbonate (RENVELA ) 800 MG tablet Take  1 tablet (800 mg total) by mouth 3 (three) times daily with meals. 02/07/24   Drusilla Sabas RAMAN, MD  SUMAtriptan  (IMITREX ) 50 MG tablet Take 50 mg by mouth every 2 (two) hours as needed for migraine or headache. 06/20/22   [provider]  VELTASSA  16.8 g PACK Take 1 packet by mouth daily at 12 noon. 04/03/24   [provider]    Physical Exam: Vitals:   05/20/24 1623 05/20/24 1625 05/20/24 1630  BP:   (!) 145/86  Pulse:  83   Resp:  20   Temp:  98 F (36.7 C)   TempSrc:  Oral   SpO2:  100%   Weight: 69 kg    Height: 5' 3 (1.6 m)     Constitutional: Acutely ill looking, NAD, generalized anasarca Eyes: PERRL, lids and conjunctivae normal ENMT: Mucous membranes are moist. Posterior pharynx clear of any exudate or lesions.Normal dentition.  Neck: normal, supple, no masses, no thyromegaly Respiratory: Coarse breath sounds with diffuse crackles normal respiratory effort. No accessory muscle use.  Cardiovascular: Regular rate and rhythm, no murmurs / rubs / gallops.  2+ extremity edema. 2+ pedal pulses. No carotid bruits.  Abdomen: no tenderness, no masses palpated. No hepatosplenomegaly. Bowel sounds positive.  Musculoskeletal: Good range of motion, no joint swelling or tenderness, Skin: no rashes, lesions, ulcers. No induration Neurologic: CN 2-12 grossly intact. Sensation intact, DTR normal. Strength 5/5 in all 4.  Psychiatric: Normal judgment and insight. Alert and oriented x 3. Normal mood  Data Reviewed:  Temperature 98 blood pressure 144/86, pulse 83, pH 7.178, sodium 134, glucose 759, potassium 5.2, chloride 91, creatinine 6.6 BUN 47, gap of 35 calcium  7.1 hemoglobin 8.4 beta-hydroxybutyrate acid more than 8 chest x-ray showed cardiomegaly with no acute findings CT abdomen pelvis shows no acute intra-abdominal or pelvic abnormality but severe diffuse anasarca and large volume ascites with small right and trace left pleural effusions patchy ground glass opacity in the  lung bases probably atelectasis or pulmonary edema  Assessment and Plan:  #1 DKA type I: Patient will be admitted to progressive care.  Initiate IV insulin  drip for DKA protocol.  Patient will also have IV fluids, potassium supplementation and probable flutter call.  CBG every 1 hour and BMP every 4 hours.  #2 end-stage renal disease: On hemodialysis.  Continue dialysis per nephrology  #3 essential hypertension: Blood pressure controlled.  #4 acute on chronic heart failure: Will continue with hemodialysis.  This  may be the cause.  #5 hyperkalemia: Patient will be seen by nephrologist for hemodialysis.  #6 essential hypertension: Resume and continue home regimen  #7 pseudohyponatremia: Will correct sodium level by correcting the glucose    Advance Care Planning:   Code Status: Full Code   Consults: Nephrology  Family Communication: No family at bedside  Severity of Illness: The appropriate patient status for this patient is INPATIENT. Inpatient status is judged to be reasonable and necessary in order to provide the required intensity of service to ensure the patient's safety. The patient's presenting symptoms, physical exam findings, and initial radiographic and laboratory data in the context of their chronic comorbidities is felt to place them at high risk for further clinical deterioration. Furthermore, it is not anticipated that the patient will be medically stable for discharge from the hospital within 2 midnights of admission.   * I certify that at the point of admission it is my clinical judgment that the patient will require inpatient hospital care spanning beyond 2 midnights from the point of admission due to high intensity of service, high risk for further deterioration and high frequency of surveillance required.*  AuthorBETHA SIM KNOLL, MD 05/20/2024 6:51 PM  For on call review [email or web address].      [1]  Allergies Allergen Reactions   Keflex  [Cephalexin ] Anaphylaxis     Ceftriaxone  in the past with no reaction   Penicillins Anaphylaxis, Hives and Rash   Vibramycin  [Doxycycline ] Anaphylaxis   Benadryl  [Diphenhydramine ] Itching   Entresto  [Sacubitril -Valsartan ] Swelling    angioedema   Dilaudid  [Hydromorphone ] Itching   Methotrexate And Trimetrexate Rash   Roxicodone  [Oxycodone ] Itching    Takes Percocet without issue   "

## 2024-05-20 NOTE — Consult Note (Signed)
 Reason for Consult: ESRD Referring Physician: Dr Patt  Chief Complaint: abdominal pain  Outpt HD: TTS [facility] ([address] 3h  B400  57.5kg  2K bath  R AVG  Hep 1600    Assessment/ Plan:   1) ESRD - on HD TTS in  - recently admitted o [facility] with last dialysis Monday but shortened treatment - No absolute indication for dialysis tonight; will have her on for first shift tomorrow -Markedly overloaded and she was still well above her EDW upon discharge Monday. - access looks good      2) HTN - BP's up 140-150/80's - Start home antihypertensives but UF will help; may possibly need dialysis on Friday and Saturday as well to try to get her back down to her dry weight.  She is markedly overloaded which is not helping with her blood pressure.     3) Volume - 1+ LE edema - up by wts -> prob over 11kg - stable and no issues oxygenating but likely will need daily dialysis to get her back down to her dry weight    4)  Anemia of esrd - will check iron  stores; transfuse if Hb <7    5) DKA  - per pmd      HPI: Ashley Freeman is an 34 y.o. female with history of uncontrolled diabetes followed by endocrinology recently admitted to [facility] for DKA and just discharged with last dialysis treatment on Monday which was a shortened treatment because of plan for discharge.  Patient now back and missed dialysis because of transportation issues, has not been eating which led her to not use her insulin  now presenting with abdominal pain and nausea.  She denies any fever, chills, shortness of breath but does have diffuse edema; she knows she is well up on her dry weight.  ROS Pertinent items are noted in HPI.  Chemistry and CBC: Creatinine  Date/Time Value Ref Range Status  11/12/2013 04:23 AM 0.59 (L) 0.60 - 1.30 mg/dL Final  92/84/7984 95:68 AM 0.70 0.60 - 1.30 mg/dL Final  92/85/7984 91:56 PM 0.71 0.60 - 1.30 mg/dL Final  94/83/7986 93:76 AM 0.69 0.60 - 1.30  mg/dL Final  94/84/7986 97:46 AM 0.84 0.60 - 1.30 mg/dL Final  94/85/7986 88:83 AM 0.83 0.60 - 1.30 mg/dL Final  94/85/7986 96:71 AM 0.99 0.60 - 1.30 mg/dL Final  94/86/7986 88:89 PM 1.01 0.60 - 1.30 mg/dL Final  94/86/7986 98:43 PM 1.44 (H) 0.60 - 1.30 mg/dL Final   Creat  Date/Time Value Ref Range Status  08/24/2013 09:57 AM 0.51 0.50 - 1.10 mg/dL Final  92/90/7986 96:43 PM 0.59 0.50 - 1.10 mg/dL Final   Creatinine, Ser  Date/Time Value Ref Range Status  05/20/2024 05:07 PM 6.60 (H) 0.44 - 1.00 mg/dL Final  98/78/7973 95:67 PM 6.23 (H) 0.44 - 1.00 mg/dL Final  98/86/7973 87:66 AM 6.33 (H) 0.44 - 1.00 mg/dL Final  98/87/7973 90:85 PM 6.20 (H) 0.44 - 1.00 mg/dL Final  98/88/7973 96:62 AM 4.76 (H) 0.44 - 1.00 mg/dL Final  98/89/7973 96:70 AM 4.47 (H) 0.44 - 1.00 mg/dL Final  98/90/7973 92:58 AM 6.22 (H) 0.44 - 1.00 mg/dL Final  98/91/7973 88:42 PM 5.93 (H) 0.44 - 1.00 mg/dL Final  98/91/7973 95:51 AM 5.49 (H) 0.44 - 1.00 mg/dL Final  98/92/7973 93:50 AM 5.48 (H) 0.44 - 1.00 mg/dL Final  98/92/7973 97:43 AM 5.18 (H) 0.44 - 1.00 mg/dL Final  98/93/7973 90:53 PM 5.09 (H) 0.44 - 1.00 mg/dL Final  98/94/7973 96:47  AM 3.69 (H) 0.44 - 1.00 mg/dL Final  98/95/7973 94:78 AM 5.14 (H) 0.44 - 1.00 mg/dL Final  98/96/7973 90:85 PM 4.85 (H) 0.44 - 1.00 mg/dL Final  98/96/7973 95:94 PM 4.66 (H) 0.44 - 1.00 mg/dL Final  98/96/7973 88:80 AM 4.55 (H) 0.44 - 1.00 mg/dL Final  98/96/7973 93:60 AM 4.42 (H) 0.44 - 1.00 mg/dL Final  98/96/7973 95:64 AM 4.36 (H) 0.44 - 1.00 mg/dL Final  98/96/7973 97:79 AM 4.37 (H) 0.44 - 1.00 mg/dL Final  98/97/7973 92:56 PM 5.01 (H) 0.44 - 1.00 mg/dL Final  98/97/7973 87:76 PM 5.59 (H) 0.44 - 1.00 mg/dL Final  98/97/7973 90:94 AM 5.37 (H) 0.44 - 1.00 mg/dL Final  98/97/7973 95:56 AM 5.19 (H) 0.44 - 1.00 mg/dL Final  98/98/7973 90:65 PM 5.30 (H) 0.44 - 1.00 mg/dL Final  98/98/7973 90:71 PM 5.30 (H) 0.44 - 1.00 mg/dL Final  87/69/7974 94:49 AM 5.19 (H) 0.44 - 1.00  mg/dL Final  87/70/7974 95:55 AM 4.27 (H) 0.44 - 1.00 mg/dL Final  87/71/7974 94:55 PM 3.92 (H) 0.44 - 1.00 mg/dL Final  87/71/7974 95:70 AM 3.50 (H) 0.44 - 1.00 mg/dL Final  87/72/7974 92:71 AM 4.68 (H) 0.44 - 1.00 mg/dL Final  87/73/7974 92:75 AM 3.87 (H) 0.44 - 1.00 mg/dL Final  87/74/7974 91:40 PM 3.23 (H) 0.44 - 1.00 mg/dL Final  87/74/7974 94:91 PM 5.56 (H) 0.44 - 1.00 mg/dL Final  87/74/7974 89:84 AM 7.00 (H) 0.44 - 1.00 mg/dL Final  87/74/7974 90:46 AM 7.00 (H) 0.44 - 1.00 mg/dL Final  87/74/7974 90:89 AM 6.79 (H) 0.44 - 1.00 mg/dL Final  87/78/7974 94:96 AM 3.49 (H) 0.44 - 1.00 mg/dL Final  87/79/7974 93:86 AM 4.57 (H) 0.44 - 1.00 mg/dL Final  87/80/7974 91:69 AM 6.23 (H) 0.44 - 1.00 mg/dL Final  87/82/7974 91:49 AM 6.47 (H) 0.44 - 1.00 mg/dL Final  87/83/7974 89:42 PM 6.19 (H) 0.44 - 1.00 mg/dL Final  87/84/7974 90:91 PM 5.84 (H) 0.44 - 1.00 mg/dL Final  87/84/7974 94:51 PM 6.04 (H) 0.44 - 1.00 mg/dL Final  87/84/7974 96:68 PM 5.95 (H) 0.44 - 1.00 mg/dL Final  87/89/7974 96:77 AM 6.16 (H) 0.44 - 1.00 mg/dL Final  87/90/7974 96:76 AM 5.20 (H) 0.44 - 1.00 mg/dL Final  87/91/7974 90:64 AM 7.42 (H) 0.44 - 1.00 mg/dL Final  87/91/7974 91:73 AM 6.80 (H) 0.44 - 1.00 mg/dL Final  87/91/7974 91:91 AM 7.18 (H) 0.44 - 1.00 mg/dL Final  87/98/7974 96:65 AM 5.84 (H) 0.44 - 1.00 mg/dL Final  88/69/7974 88:70 AM 7.62 (H) 0.44 - 1.00 mg/dL Final   Recent Labs  Lab 05/20/24 1632 05/20/24 1707 05/20/24 1719  NA 134* 132* 130*  K 5.2* 5.0 5.0  CL 91* 102  --   CO2 8*  --   --   GLUCOSE 759* >700*  --   BUN 47* 41*  --   CREATININE 6.23* 6.60*  --   CALCIUM  7.1*  --   --    Recent Labs  Lab 05/20/24 1632 05/20/24 1707 05/20/24 1719  WBC 10.4  --   --   NEUTROABS 5.4  --   --   HGB 8.4* 10.2* 9.9*  HCT 29.1* 30.0* 29.0*  MCV 92.4  --   --   PLT 300  --   --    Liver Function Tests: No results for input(s): AST, ALT, ALKPHOS, BILITOT, PROT, ALBUMIN  in the last  168 hours. No results for input(s): LIPASE, AMYLASE in the last 168 hours. No results for input(s):  AMMONIA in the last 168 hours. Cardiac Enzymes: No results for input(s): CKTOTAL, CKMB, CKMBINDEX, TROPONINI in the last 168 hours. Iron  Studies: No results for input(s): IRON , TIBC, TRANSFERRIN, FERRITIN in the last 72 hours. PT/INR: @LABRCNTIP (inr:5)  Xrays/Other Studies: ) Results for orders placed or performed during the hospital encounter of 05/20/24 (from the past 48 hours)  hCG, serum, qualitative     Status: None   Collection Time: 05/20/24  4:32 PM  Result Value Ref Range   Preg, Serum NEGATIVE NEGATIVE    Comment:        THE SENSITIVITY OF THIS METHODOLOGY IS >10 mIU/mL. Performed at [facility], [address], [place], [place] [address]   Basic metabolic panel     Status: Abnormal   Collection Time: 05/20/24  4:32 PM  Result Value Ref Range   Sodium 134 (L) 135 - 145 mmol/L    Comment: Electrolytes repeated to verify    Potassium 5.2 (H) 3.5 - 5.1 mmol/L   Chloride 91 (L) 98 - 111 mmol/L   CO2 8 (L) 22 - 32 mmol/L   Glucose, Bld 759 (HH) 70 - 99 mg/dL    Comment: Critical Value, Read Back and verified with R.GONZALEZ,RN @1800  05/20/2024 VANG.J Glucose reference range applies only to samples taken after fasting for at least 8 hours.    BUN 47 (H) 6 - 20 mg/dL   Creatinine, Ser 3.76 (H) 0.44 - 1.00 mg/dL   Calcium  7.1 (L) 8.9 - 10.3 mg/dL   GFR, Estimated 9 (L) >60 mL/min    Comment: (NOTE) Calculated using the CKD-EPI Creatinine Equation (2021)    Anion gap 35 (H) 5 - 15    Comment: Performed at [facility], [address], [place], [place] [address]  Beta-hydroxybutyric acid     Status: Abnormal   Collection Time: 05/20/24  4:32 PM  Result Value Ref Range   Beta-Hydroxybutyric Acid >8.00 (H) 0.05 - 0.27 mmol/L    Comment: Results confirmed by manual dilution  (NOTE) This is a modified FDA-approved test that has been  validated and its performance characteristics determined by the reporting laboratory. This laboratory is certified under the Clinical Laboratory Improvement Amendments (CLIA) as qualified to perform high complexity clinical laboratory testing.  Performed at [facility], [address], [place], [place] [address]   CBC with Differential (PNL)     Status: Abnormal   Collection Time: 05/20/24  4:32 PM  Result Value Ref Range   WBC 10.4 4.0 - 10.5 K/uL   RBC 3.15 (L) 3.87 - 5.11 MIL/uL   Hemoglobin 8.4 (L) 12.0 - 15.0 g/dL   HCT 70.8 (L) 63.9 - 53.9 %   MCV 92.4 80.0 - 100.0 fL   MCH 26.7 26.0 - 34.0 pg   MCHC 28.9 (L) 30.0 - 36.0 g/dL   RDW 84.0 (H) 88.4 - 84.4 %   Platelets 300 150 - 400 K/uL   nRBC 0.0 0.0 - 0.2 %   Neutrophils Relative % 50 %   Neutro Abs 5.4 1.7 - 7.7 K/uL   Lymphocytes Relative 33 %   Lymphs Abs 3.4 0.7 - 4.0 K/uL   Monocytes Relative 9 %   Monocytes Absolute 0.9 0.1 - 1.0 K/uL   Eosinophils Relative 6 %   Eosinophils Absolute 0.6 (H) 0.0 - 0.5 K/uL   Basophils Relative 1 %   Basophils Absolute 0.1 0.0 - 0.1 K/uL   Immature Granulocytes 1 %   Abs Immature Granulocytes  0.06 0.00 - 0.07 K/uL    Comment: Performed at [facility], [address], [place], [place] [address]  Troponin T, High Sensitivity     Status: Abnormal   Collection Time: 05/20/24  4:32 PM  Result Value Ref Range   Troponin T High Sensitivity 497 (HH) 0 - 19 ng/L    Comment: Critical Value, Read Back and verified with TAMEA ROSALITA SPURR, RN @ [phone] 05/20/2024 SORTOG (NOTE) Biotin concentrations > 1000 ng/mL falsely decrease TnT results.  Serial cardiac troponin measurements are suggested.  Refer to the Links section for chest pain algorithms and additional  guidance. Performed at [facility], [address] [place], [place] [address]   Pro Brain natriuretic peptide     Status: Abnormal   Collection Time: 05/20/24  4:32 PM  Result Value Ref Range   Pro  Brain Natriuretic Peptide >35,000.0 (H) <300.0 pg/mL    Comment: (NOTE) Age Group        Cut-Points    Interpretation  < 50 years     450 pg/mL       NT-proBNP > 450 pg/mL indicates                                ADHF is likely              50 to 75 years  900 pg/mL      NT-proBNP > 900 pg/mL indicates          ADHF is likely  > 75 years      1800 pg/mL     NT-proBNP > 1800 pg/mL indicates          ADHF is likely                           All ages    Results between       Indeterminate. Further clinical             300 and the cut-   information is needed to determine            point for age group   if ADHF is present.                                                             Elecsys proBNP II/ Elecsys proBNP II STAT           Cut-Point                       Interpretation  300 pg/mL                    NT-proBNP <300pg/mL indicates                             ADHF is not likely  Performed at [facility], [address] [place], [place] [address]   CBG monitoring, ED     Status: Abnormal   Collection Time: 05/20/24  4:44 PM  Result Value Ref Range   Glucose-Capillary >600 (HH) 70 - 99 mg/dL    Comment:  Glucose reference range applies only to samples taken after fasting for at least 8 hours.  I-stat chem 8, ED (not at [facility], [facility] or [facility])     Status: Abnormal   Collection Time: 05/20/24  5:07 PM  Result Value Ref Range   Sodium 132 (L) 135 - 145 mmol/L   Potassium 5.0 3.5 - 5.1 mmol/L   Chloride 102 98 - 111 mmol/L   BUN 41 (H) 6 - 20 mg/dL   Creatinine, Ser 3.39 (H) 0.44 - 1.00 mg/dL   Glucose, Bld >299 (HH) 70 - 99 mg/dL    Comment: Glucose reference range applies only to samples taken after fasting for at least 8 hours.   Calcium , Ion 0.89 (LL) 1.15 - 1.40 mmol/L   TCO2 11 (L) 22 - 32 mmol/L   Hemoglobin 10.2 (L) 12.0 - 15.0 g/dL   HCT 69.9 (L) 63.9 - 53.9 %   Comment NOTIFIED PHYSICIAN   I-Stat Venous Blood Gas, ED     Status: Abnormal   Collection Time:  05/20/24  5:19 PM  Result Value Ref Range   pH, Ven 7.178 (LL) 7.25 - 7.43   pCO2, Ven 24.1 (L) 44 - 60 mmHg   pO2, Ven 199 (H) 32 - 45 mmHg   Bicarbonate 9.0 (L) 20.0 - 28.0 mmol/L   TCO2 10 (L) 22 - 32 mmol/L   O2 Saturation 99 %   Acid-base deficit 18.0 (H) 0.0 - 2.0 mmol/L   Sodium 130 (L) 135 - 145 mmol/L   Potassium 5.0 3.5 - 5.1 mmol/L   Calcium , Ion 0.88 (LL) 1.15 - 1.40 mmol/L   HCT 29.0 (L) 36.0 - 46.0 %   Hemoglobin 9.9 (L) 12.0 - 15.0 g/dL   Sample type CAPILLARY    Comment NOTIFIED PHYSICIAN   CBG monitoring, ED     Status: Abnormal   Collection Time: 05/20/24  6:19 PM  Result Value Ref Range   Glucose-Capillary >600 (HH) 70 - 99 mg/dL    Comment: Glucose reference range applies only to samples taken after fasting for at least 8 hours.  CBG monitoring, ED     Status: Abnormal   Collection Time: 05/20/24  6:56 PM  Result Value Ref Range   Glucose-Capillary >600 (HH) 70 - 99 mg/dL    Comment: Glucose reference range applies only to samples taken after fasting for at least 8 hours.  CBG monitoring, ED     Status: Abnormal   Collection Time: 05/20/24  7:39 PM  Result Value Ref Range   Glucose-Capillary 588 (HH) 70 - 99 mg/dL    Comment: Glucose reference range applies only to samples taken after fasting for at least 8 hours.   Comment 1 Notify RN    *Note: Due to a large number of results and/or encounters for the requested time period, some results have not been displayed. A complete set of results can be found in Results Review.   CT ABDOMEN PELVIS WO CONTRAST Result Date: 05/20/2024 EXAM: CT ABDOMEN AND PELVIS WITHOUT CONTRAST 05/20/2024 05:21:43 PM TECHNIQUE: CT of the abdomen and pelvis was performed without the administration of intravenous contrast. Multiplanar reformatted images are provided for review. Automated exposure control, iterative reconstruction, and/or weight-based adjustment of the mA/kV was utilized to reduce the radiation dose to as low as reasonably  achievable. COMPARISON: 03/08/2024 CLINICAL HISTORY: Abdominal pain, acute, nonlocalized; ab pain r/o SBO. Patient has hx of ESRD on HD. FINDINGS: LOWER CHEST: Small right and trace left pleural effusions. Patchy ground glass  opacities in the lung bases, possibly atelectasis or developing edema. LIVER: The liver is unremarkable. GALLBLADDER AND BILE DUCTS: Cholecystectomy. No biliary ductal dilatation. SPLEEN: No acute abnormality. PANCREAS: No acute abnormality. ADRENAL GLANDS: No acute abnormality. KIDNEYS, URETERS AND BLADDER: Renal cortical atrophy bilaterally, consistent with chronic medical renal disease. No stones in the kidneys or ureters. No hydronephrosis. No perinephric or periureteral stranding. Plate decompression of the urinary bladder. GI AND BOWEL: Stomach demonstrates no acute abnormality. Gas filled nondistended appendix. There is no bowel obstruction. PERITONEUM AND RETROPERITONEUM: Large volume ascites. No free air. VASCULATURE: Aorta is normal in caliber. Diffuse [place] calcification throughout the mesenteric vessels. LYMPH NODES: No lymphadenopathy. REPRODUCTIVE ORGANS: The uterus and ovaries are within normal limits for patients age. BONES AND SOFT TISSUES: No acute osseous abnormality. Severe, diffuse anasarca. IMPRESSION: 1. No acute intraabdominal or pelvic abnormality. 2. Severe, diffuse anasarca and large volume ascites with small right and trace left pleural effusions, likely related to the patient's volume status. 3. Patchy ground glass opacities in the lung bases, possibly representing atelectasis or developing pulmonary edema. Electronically signed by: Rogelia Myers MD 05/20/2024 05:33 PM EST RP Workstation: [id number]   DG Chest Port 1 View Result Date: 05/20/2024 EXAM: 1 VIEW(S) XRAY OF THE CHEST 05/20/2024 05:02:04 PM COMPARISON: 05/12/2024 CLINICAL HISTORY: SOB SOB SOB SOB SOB FINDINGS: LINES, TUBES AND DEVICES: Cardiac leads in place. LUNGS AND PLEURA: Underinflation. No  focal pulmonary opacity. No pleural effusion. No pneumothorax. HEART AND MEDIASTINUM: Cardiomegaly. BONES AND SOFT TISSUES: No acute osseous abnormality. IMPRESSION: 1. Cardiomegaly. 2. No acute findings. Electronically signed by: Greig Pique MD 05/20/2024 05:10 PM EST RP Workstation: [id number]    PMH:   Past Medical History:  Diagnosis Date   Abscess, gluteal, right 08/24/2013   Anemia 02/19/2012   Bartholin's gland abscess 09/19/2013   Bipolar disorder (HCC)    BV (bacterial vaginosis) 11/24/2015   Depression    Diabetes mellitus type I (HCC) 2001   Diagnosed at age 30 ; Type I   Diarrhea 05/30/2016   DKA (diabetic ketoacidoses) 08/19/2013   Also in 2018   ESRD (end stage renal disease) (HCC)    Gonorrhea 08/2011   Treated in 09/2011   HFrEF (heart failure with reduced ejection fraction) (HCC)    a. 2022 Echo: EF 40%; b. 10/2021 Echo: EF 55%; b. 07/2022 MV: No ischemia. EF 31%; c. 08/2022 Echo: EF 35%, mildly dil RV, sev TR.   History of trichomoniasis 05/31/2016   Hyperlipidemia 03/28/2016   Hypertension    NICM (nonischemic cardiomyopathy) (HCC)    Sepsis (HCC) 09/19/2013    PSH:   Past Surgical History:  Procedure Laterality Date   A/V FISTULAGRAM Right 06/17/2023   Procedure: A/V Fistulagram;  Surgeon: Marea Selinda RAMAN, MD;  Location: [facility] INVASIVE CV LAB;  Service: Cardiovascular;  Laterality: Right;   A/V SHUNT INTERVENTION N/A 09/25/2023   Procedure: A/V SHUNT INTERVENTION;  Surgeon: Pearline Norman RAMAN, MD;  Location: [facility] PV LAB;  Service: Cardiovascular;  Laterality: N/A;   AV FISTULA PLACEMENT Right 07/06/2022   Procedure: ARTERIOVENOUS GRAFT CREATION;  Surgeon: Gretta Lonni PARAS, MD;  Location: [facility] OR;  Service: Vascular;  Laterality: Right;   CESAREAN SECTION N/A 10/05/2019   Procedure: CESAREAN SECTION;  Surgeon: Izell Harari, MD;  Location: [facility] LD ORS;  Service: Obstetrics;  Laterality: N/A;   CHOLECYSTECTOMY N/A 07/02/2023   Procedure: LAPAROSCOPIC  CHOLECYSTECTOMY;  Surgeon: Ebbie Cough, MD;  Location: [facility] OR;  Service: General;  Laterality: N/A;   ESOPHAGOGASTRODUODENOSCOPY N/A  01/31/2024   Procedure: EGD (ESOPHAGOGASTRODUODENOSCOPY);  Surgeon: San Sandor GAILS, DO;  Location: [facility] ENDOSCOPY;  Service: Gastroenterology;  Laterality: N/A;   INCISION AND DRAINAGE ABSCESS Left 09/28/2019   Procedure: INCISION AND DRAINAGE VULVAR ABCESS;  Surgeon: Edsel Norleen GAILS, MD;  Location: [facility] OR;  Service: Gynecology;  Laterality: Left;   INCISION AND DRAINAGE ABSCESS Right 02/23/2024   Procedure: INCISION AND DRAINAGE, ABSCESS;  Surgeon: Tobie Eldora NOVAK, MD;  Location: [facility] OR;  Service: ENT;  Laterality: Right;   INCISION AND DRAINAGE PERIRECTAL ABSCESS Right 08/18/2013   Procedure: IRRIGATION AND DEBRIDEMENT GLUTEAL ABSCESS;  Surgeon: Lynda Leos, MD;  Location: [facility] OR;  Service: General;  Laterality: Right;   INCISION AND DRAINAGE PERIRECTAL ABSCESS Right 09/19/2013   Procedure: IRRIGATION AND DEBRIDEMENT RIGHT GLUTEAL AND LABIAL ABSCESSES;  Surgeon: Lynda Leos, MD;  Location: [facility] OR;  Service: General;  Laterality: Right;   INCISION AND DRAINAGE PERIRECTAL ABSCESS Right 09/24/2013   Procedure: IRRIGATION AND DEBRIDEMENT PERIRECTAL ABSCESS;  Surgeon: Lynwood MALVA Pina, MD;  Location[facility] OR;  Service: General;  Laterality: Right;   IR PARACENTESIS  08/28/2023   IR PARACENTESIS  11/04/2023   IR PARACENTESIS  12/23/2023   IR PARACENTESIS  02/04/2024   IR PARACENTESIS  03/23/2024   IR PARACENTESIS  04/06/2024    Allergies: Allergies[1]  Medications:   Prior to Admission medications  Medication Sig Start Date End Date Taking? Authorizing Provider  albuterol  (VENTOLIN  HFA) 108 (90 Base) MCG/ACT inhaler Inhale 2 puffs into the lungs every 4 (four) hours as needed for wheezing or shortness of breath. 11/24/22   [provider]  bumetanide  (BUMEX ) 2 MG tablet Take 10 mg by mouth daily.    [provider]  carvedilol  (COREG ) 25 MG  tablet Take 1 tablet (25 mg total) by mouth 2 (two) times daily with a meal. Follow with your PCP for refills. 04/18/24 05/18/24  Perri DELENA Meliton Mickey., MD  Cinnamon 500 MG capsule Take 500 mg by mouth in the morning.    [provider]  diazePAM , 15 MG Dose, 2 x 7.5 MG/0.1ML LQPK Place 15 mg into the nose as needed (sz lasting over 2 minutes). 04/28/24   Yadav, Priyanka O, MD  DULoxetine  (CYMBALTA ) 20 MG capsule Take 1 capsule (20 mg total) by mouth daily. 05/04/24   Ghimire, Donalda HERO, MD  Elderberry-Vitamin C-Zinc  (ELDERBERRY IMMUNE HEALTH GUMMY PO) Take 1 tablet by mouth daily.    [provider]  fluticasone  (FLONASE ) 50 MCG/ACT nasal spray Place 2 sprays into both nostrils daily as needed for allergies or rhinitis. 12/11/23 12/10/24  [provider]  hydrOXYzine  (ATARAX ) 25 MG tablet Take 1 tablet (25 mg total) by mouth 3 (three) times daily as needed for anxiety, itching, nausea or vomiting. 05/04/24   Ghimire, Donalda HERO, MD  insulin  aspart (NOVOLOG ) 100 UNIT/ML FlexPen Inject 2 Units into the skin 3 (three) times daily with meals. 05/10/24   Cindy Garnette POUR, MD  lacosamide  (VIMPAT ) 50 MG TABS tablet Take 2 tablets (100 mg total) by mouth 2 (two) times daily. Take additional 1 tablet (50 mg) after dialysis on Tuesdays, Thursdays and Saturdays 05/04/24   Raenelle Donalda HERO, MD  LANTUS  SOLOSTAR 100 UNIT/ML Solostar Pen Inject 5 Units into the skin daily. 04/18/24   Perri DELENA Meliton Mickey., MD  methocarbamol  (ROBAXIN ) 500 MG tablet Take 1 tablet (500 mg total) by mouth 4 (four) times daily. 05/10/24   Cindy Garnette POUR, MD  NOVOLOG  FLEXPEN 100 UNIT/ML FlexPen Before  each meal 3 times a day, 140-199 - 2 units, 200-250 - 4 units, 251-299 - 6 units,  300-349 - 7 units,  350 or above 8 units. Patient taking differently: Inject 5 Units into the skin 3 (three) times daily with meals. 03/30/24   Singh, Prashant K, MD  OLANZapine  (ZYPREXA ) 5 MG tablet Take 1 tablet (5 mg total) by mouth at  bedtime. Patient not taking: Reported on 05/06/2024 05/04/24   Raenelle Donalda HERO, MD  oxyCODONE -acetaminophen  (PERCOCET/ROXICET) 5-325 MG tablet Take 1 tablet by mouth every 8 (eight) hours as needed for moderate pain (pain score 4-6). 05/10/24   Cindy Garnette POUR, MD  Pancrelipase , Lip-Prot-Amyl, 3000-9500 units CPEP Take 3,000 Units by mouth in the morning, at noon, and at bedtime.    [provider]  prochlorperazine  (COMPAZINE ) 5 MG tablet Take 5 mg by mouth every 6 (six) hours as needed for nausea or vomiting.    [provider]  sevelamer  carbonate (RENVELA ) 800 MG tablet Take 1 tablet (800 mg total) by mouth 3 (three) times daily with meals. 02/07/24   Drusilla Sabas RAMAN, MD  SUMAtriptan  (IMITREX ) 50 MG tablet Take 50 mg by mouth every 2 (two) hours as needed for migraine or headache. 06/20/22   [provider]  VELTASSA  16.8 g PACK Take 1 packet by mouth daily at 12 noon. 04/03/24   [provider]    Discontinued Meds:  There are no discontinued medications.  Social History:  reports that she has never smoked. She has never been exposed to tobacco smoke. She has never used smokeless tobacco. She reports that she does not currently use alcohol . She reports that she does not use drugs.  Family History:   Family History  Problem Relation Age of Onset   Asthma Mother    Carpal tunnel syndrome Mother    Gout Father    Diabetes Paternal Grandmother    Anesthesia problems Neg Hx     Blood pressure (!) 143/47, pulse 77, temperature 98.1 F (36.7 C), temperature source Oral, resp. rate 11, height 5' 3 (1.6 m), weight 69 kg, SpO2 99%. Gen alert & oriented x3 No jvd or bruits Chest CTA b/l RRR no MRG Abd soft ntnd no mass, 1-2+ ascites +bs Ext tr -1+ pitting edema LEs or UEs Neuro moving all 4 ext RUE AVG+ bruit, no sig hematoma        Caliber Landess, LYNWOOD ORN, MD 05/20/2024, 8:08 PM      [1]  Allergies Allergen Reactions   Keflex  [Cephalexin ] Anaphylaxis     Ceftriaxone  in the past with no reaction   Penicillins Anaphylaxis, Hives and Rash   Vibramycin  [Doxycycline ] Anaphylaxis   Benadryl  [Diphenhydramine ] Itching   Entresto  [Sacubitril -Valsartan ] Swelling    angioedema   Dilaudid  [Hydromorphone ] Itching   Methotrexate And Trimetrexate Rash   Roxicodone  [Oxycodone ] Itching    Takes Percocet without issue

## 2024-05-20 NOTE — Care Plan (Signed)
 Transition of Care Encounter Data   Call attempt: 2 Admission date: 05/12/24 Discharge date: 05/18/24 Discharge diagnosis: Hyperglycemia due to type 1 diabetes mellitus Patient post discharge: Medications:         [facility]: [phone] [facility]: [phone]:    Other: Contact PCP:                                                    Transitions Case Management MyChart Follow-Up  Case Manager sent MyChart Followup message to patients MyChart.           Camie LITTIE Moll, RN

## 2024-05-20 NOTE — ED Notes (Signed)
 Patient transported to CT

## 2024-05-20 NOTE — ED Provider Notes (Signed)
 "  EMERGENCY DEPARTMENT AT [facility] Provider Note   CSN: [id number] Arrival date & time: 05/20/24  1619     Patient presents with: Facial Swelling   Ashley Freeman is a 34 y.o. female history of ESRD on dialysis, uncontrolled diabetes here presenting with hyperglycemia and abdominal pain and edema.  Patient was just admitted to [facility] for DKA.  Patient was on insulin  drip and eventually was discharged on the 13th.  Patient came back to [facility] and had dialysis 2 days ago.  Patient states that today she missed dialysis due to transportation issues.  She states that she has not been eating so has not been using her insulin .  Patient has abdominal pain and nausea.  Patient denies any fevers.  Patient states that she has shortness of breath and diffuse edema as well.  Patient does not urinate.    The history is provided by the patient.       Prior to Admission medications  Medication Sig Start Date End Date Taking? Authorizing Provider  albuterol  (VENTOLIN  HFA) 108 (90 Base) MCG/ACT inhaler Inhale 2 puffs into the lungs every 4 (four) hours as needed for wheezing or shortness of breath. 11/24/22   [provider]  bumetanide  (BUMEX ) 2 MG tablet Take 10 mg by mouth daily.    [provider]  carvedilol  (COREG ) 25 MG tablet Take 1 tablet (25 mg total) by mouth 2 (two) times daily with a meal. Follow with your PCP for refills. 04/18/24 05/18/24  Perri DELENA Meliton Mickey., MD  Cinnamon 500 MG capsule Take 500 mg by mouth in the morning.    [provider]  diazePAM , 15 MG Dose, 2 x 7.5 MG/0.1ML LQPK Place 15 mg into the nose as needed (sz lasting over 2 minutes). 04/28/24   Yadav, Priyanka O, MD  DULoxetine  (CYMBALTA ) 20 MG capsule Take 1 capsule (20 mg total) by mouth daily. 05/04/24   Ghimire, Donalda HERO, MD  Elderberry-Vitamin C-Zinc  (ELDERBERRY IMMUNE HEALTH GUMMY PO) Take 1 tablet by mouth daily.    [provider]   fluticasone  (FLONASE ) 50 MCG/ACT nasal spray Place 2 sprays into both nostrils daily as needed for allergies or rhinitis. 12/11/23 12/10/24  [provider]  hydrOXYzine  (ATARAX ) 25 MG tablet Take 1 tablet (25 mg total) by mouth 3 (three) times daily as needed for anxiety, itching, nausea or vomiting. 05/04/24   Ghimire, Donalda HERO, MD  insulin  aspart (NOVOLOG ) 100 UNIT/ML FlexPen Inject 2 Units into the skin 3 (three) times daily with meals. 05/10/24   Cindy Garnette POUR, MD  lacosamide  (VIMPAT ) 50 MG TABS tablet Take 2 tablets (100 mg total) by mouth 2 (two) times daily. Take additional 1 tablet (50 mg) after dialysis on Tuesdays, Thursdays and Saturdays 05/04/24   Raenelle Donalda HERO, MD  LANTUS  SOLOSTAR 100 UNIT/ML Solostar Pen Inject 5 Units into the skin daily. 04/18/24   Perri DELENA Meliton Mickey., MD  methocarbamol  (ROBAXIN ) 500 MG tablet Take 1 tablet (500 mg total) by mouth 4 (four) times daily. 05/10/24   Cindy Garnette POUR, MD  NOVOLOG  FLEXPEN 100 UNIT/ML FlexPen Before each meal 3 times a day, 140-199 - 2 units, 200-250 - 4 units, 251-299 - 6 units,  300-349 - 7 units,  350 or above 8 units. Patient taking differently: Inject 5 Units into the skin 3 (three) times daily with meals. 03/30/24   Singh, Prashant K, MD  OLANZapine  (ZYPREXA ) 5 MG tablet Take 1 tablet (5 mg total)  by mouth at bedtime. Patient not taking: Reported on 05/06/2024 05/04/24   Raenelle Donalda HERO, MD  oxyCODONE -acetaminophen  (PERCOCET/ROXICET) 5-325 MG tablet Take 1 tablet by mouth every 8 (eight) hours as needed for moderate pain (pain score 4-6). 05/10/24   Cindy Garnette POUR, MD  Pancrelipase , Lip-Prot-Amyl, 3000-9500 units CPEP Take 3,000 Units by mouth in the morning, at noon, and at bedtime.    [provider]  prochlorperazine  (COMPAZINE ) 5 MG tablet Take 5 mg by mouth every 6 (six) hours as needed for nausea or vomiting.    [provider]  sevelamer  carbonate (RENVELA ) 800 MG tablet Take 1 tablet (800 mg total)  by mouth 3 (three) times daily with meals. 02/07/24   Drusilla Sabas RAMAN, MD  SUMAtriptan  (IMITREX ) 50 MG tablet Take 50 mg by mouth every 2 (two) hours as needed for migraine or headache. 06/20/22   [provider]  VELTASSA  16.8 g PACK Take 1 packet by mouth daily at 12 noon. 04/03/24   [provider]    Allergies: Keflex  [cephalexin ], Penicillins, Vibramycin  [doxycycline ], Benadryl  [diphenhydramine ], Entresto  [sacubitril -valsartan ], Dilaudid  [hydromorphone ], Methotrexate and trimetrexate, and Roxicodone  [oxycodone ]    Review of Systems  HENT:  Positive for facial swelling.   Gastrointestinal:  Positive for abdominal pain.  All other systems reviewed and are negative.   Updated Vital Signs BP (!) 145/86 (BP Location: Left Wrist)   Pulse 83   Temp 98 F (36.7 C) (Oral)   Resp 20   Ht 5' 3 (1.6 m)   Wt 69 kg   SpO2 100%   BMI 26.95 kg/m   Physical Exam Vitals and nursing note reviewed.  Constitutional:      Comments: Mentating well and chronically ill  HENT:     Head: Normocephalic.     Nose: Nose normal.     Mouth/Throat:     Mouth: Mucous membranes are dry.  Eyes:     Extraocular Movements: Extraocular movements intact.     Pupils: Pupils are equal, round, and reactive to light.  Cardiovascular:     Rate and Rhythm: Normal rate and regular rhythm.     Pulses: Normal pulses.     Heart sounds: Normal heart sounds.  Pulmonary:     Comments: Diminished bilaterally Abdominal:     Comments: Mild diffuse distention  Musculoskeletal:     Cervical back: Normal range of motion.     Comments: 2+ edema bilateral legs  Skin:    General: Skin is warm.     Capillary Refill: Capillary refill takes less than 2 seconds.  Neurological:     General: No focal deficit present.     Mental Status: She is oriented to person, place, and time.  Psychiatric:        Mood and Affect: Mood normal.        Behavior: Behavior normal.     (all labs ordered are listed, but only  abnormal results are displayed) Labs Reviewed  CBC WITH DIFFERENTIAL/PLATELET - Abnormal; Notable for the following components:      Result Value   RBC 3.15 (*)    Hemoglobin 8.4 (*)    HCT 29.1 (*)    MCHC 28.9 (*)    RDW 15.9 (*)    Eosinophils Absolute 0.6 (*)    All other components within normal limits  CBG MONITORING, ED - Abnormal; Notable for the following components:   Glucose-Capillary >600 (*)    All other components within normal limits  I-STAT VENOUS BLOOD GAS,  ED - Abnormal; Notable for the following components:   pH, Ven 7.178 (*)    pCO2, Ven 24.1 (*)    pO2, Ven 199 (*)    Bicarbonate 9.0 (*)    TCO2 10 (*)    Acid-base deficit 18.0 (*)    Sodium 130 (*)    Calcium , Ion 0.88 (*)    HCT 29.0 (*)    Hemoglobin 9.9 (*)    All other components within normal limits  I-STAT CHEM 8, ED - Abnormal; Notable for the following components:   Sodium 132 (*)    BUN 41 (*)    Creatinine, Ser 6.60 (*)    Glucose, Bld >700 (*)    Calcium , Ion 0.89 (*)    TCO2 11 (*)    Hemoglobin 10.2 (*)    HCT 30.0 (*)    All other components within normal limits  HCG, SERUM, QUALITATIVE  BASIC METABOLIC PANEL WITH GFR  BASIC METABOLIC PANEL WITH GFR  BASIC METABOLIC PANEL WITH GFR  BASIC METABOLIC PANEL WITH GFR  BETA-HYDROXYBUTYRIC ACID  BETA-HYDROXYBUTYRIC ACID  BETA-HYDROXYBUTYRIC ACID  BETA-HYDROXYBUTYRIC ACID  PRO BRAIN NATRIURETIC PEPTIDE  TROPONIN T, HIGH SENSITIVITY    EKG: EKG Interpretation Date/Time:  Wednesday May 20 2024 16:28:47 EST Ventricular Rate:  84 PR Interval:  153 QRS Duration:  103 QT Interval:  440 QTC Calculation: 521 R Axis:   54  Text Interpretation: Sinus rhythm Prolonged QT interval No significant change since last tracing Confirmed by Patt Alm DEL [phone]) on 05/20/2024 4:58:05 PM  Radiology: CT ABDOMEN PELVIS WO CONTRAST Result Date: 05/20/2024 EXAM: CT ABDOMEN AND PELVIS WITHOUT CONTRAST 05/20/2024 05:21:43 PM TECHNIQUE: CT of the  abdomen and pelvis was performed without the administration of intravenous contrast. Multiplanar reformatted images are provided for review. Automated exposure control, iterative reconstruction, and/or weight-based adjustment of the mA/kV was utilized to reduce the radiation dose to as low as reasonably achievable. COMPARISON: 03/08/2024 CLINICAL HISTORY: Abdominal pain, acute, nonlocalized; ab pain r/o SBO. Patient has hx of ESRD on HD. FINDINGS: LOWER CHEST: Small right and trace left pleural effusions. Patchy ground glass opacities in the lung bases, possibly atelectasis or developing edema. LIVER: The liver is unremarkable. GALLBLADDER AND BILE DUCTS: Cholecystectomy. No biliary ductal dilatation. SPLEEN: No acute abnormality. PANCREAS: No acute abnormality. ADRENAL GLANDS: No acute abnormality. KIDNEYS, URETERS AND BLADDER: Renal cortical atrophy bilaterally, consistent with chronic medical renal disease. No stones in the kidneys or ureters. No hydronephrosis. No perinephric or periureteral stranding. Plate decompression of the urinary bladder. GI AND BOWEL: Stomach demonstrates no acute abnormality. Gas filled nondistended appendix. There is no bowel obstruction. PERITONEUM AND RETROPERITONEUM: Large volume ascites. No free air. VASCULATURE: Aorta is normal in caliber. Diffuse [place] calcification throughout the mesenteric vessels. LYMPH NODES: No lymphadenopathy. REPRODUCTIVE ORGANS: The uterus and ovaries are within normal limits for patients age. BONES AND SOFT TISSUES: No acute osseous abnormality. Severe, diffuse anasarca. IMPRESSION: 1. No acute intraabdominal or pelvic abnormality. 2. Severe, diffuse anasarca and large volume ascites with small right and trace left pleural effusions, likely related to the patient's volume status. 3. Patchy ground glass opacities in the lung bases, possibly representing atelectasis or developing pulmonary edema. Electronically signed by: Rogelia Myers MD 05/20/2024  05:33 PM EST RP Workstation: [email or web address]   DG Chest Port 1 View Result Date: 05/20/2024 EXAM: 1 VIEW(S) XRAY OF THE CHEST 05/20/2024 05:02:04 PM COMPARISON: 05/12/2024 CLINICAL HISTORY: SOB SOB SOB SOB SOB FINDINGS: LINES, TUBES AND DEVICES: Cardiac leads in place.  LUNGS AND PLEURA: Underinflation. No focal pulmonary opacity. No pleural effusion. No pneumothorax. HEART AND MEDIASTINUM: Cardiomegaly. BONES AND SOFT TISSUES: No acute osseous abnormality. IMPRESSION: 1. Cardiomegaly. 2. No acute findings. Electronically signed by: Greig Pique MD 05/20/2024 05:10 PM EST RP Workstation: [id number]     Procedures   Angiocath insertion Performed by: Alm VEAR Cave  Consent: Verbal consent obtained. Risks and benefits: risks, benefits and alternatives were discussed Time out: Immediately prior to procedure a time out was called to verify the correct patient, procedure, equipment, support staff and site/side marked as required.  Preparation: Patient was prepped and draped in the usual sterile fashion.  Vein Location: L axillary   Ultrasound Guided  Gauge: 20 long   Normal blood return and flush without difficulty Patient tolerance: Patient tolerated the procedure well with no immediate complications.  CRITICAL CARE Performed by: Alm VEAR Cave   Total critical care time: 30 minutes  Critical care time was exclusive of separately billable procedures and treating other patients.  Critical care was necessary to treat or prevent imminent or life-threatening deterioration.  Critical care was time spent personally by me on the following activities: development of treatment plan with patient and/or surrogate as well as nursing, discussions with consultants, evaluation of patient's response to treatment, examination of patient, obtaining history from patient or surrogate, ordering and performing treatments and interventions, ordering and review of laboratory studies, ordering and review of  radiographic studies, pulse oximetry and re-evaluation of patient's condition.    Medications Ordered in the ED  insulin  regular, human (MYXREDLIN ) 100 units/ 100 mL infusion (has no administration in time range)  lactated ringers  infusion (has no administration in time range)  dextrose  5 % in lactated ringers  infusion (has no administration in time range)  dextrose  50 % solution 0-50 mL (has no administration in time range)  fentaNYL  (SUBLIMAZE ) injection 50 mcg (has no administration in time range)  fentaNYL  (SUBLIMAZE ) injection 50 mcg (50 mcg Intravenous Given 05/20/24 1642)  insulin  aspart (novoLOG ) injection 2 Units (2 Units Subcutaneous Given 05/20/24 1646)                                    Medical Decision Making Ashley Freeman is a 34 y.o. female history of ESRD on dialysis here presenting with shortness of breath and abdominal pain and elevated blood sugar.  Patient was recently admitted to [facility] for similar symptoms.  Patient was found to have DKA at that point.  Concern for DKA and also heart failure.  Patient appears fluid overloaded.  Plan to obtain DKA labs with CBC and CMP and VBG and beta hydroxy and CT abdomen pelvis.  6:06 PM Patient's pH is 7.17.  Patient's glucose is greater than 700.  Patient started on IV insulin  for type I diabetic DKA protocol.  Patient CT abdomen pelvis showed pleural effusion and anasarca.  I discussed with critical care doctor, since the pH is greater than 7.0, patient does not need ICU right now.  I also discussed with Dr. Melia from nephrology who will see the patient as a consult.  Patient will be admitted to stepdown for DKA  Problems Addressed: Anasarca: acute illness or injury Diabetic ketoacidosis without coma associated with type 1 diabetes mellitus (HCC): acute illness or injury ESRD (end stage renal disease) on dialysis [facility]): chronic illness or injury  Amount and/or Complexity of Data Reviewed Labs: ordered.  Decision-making details  documented in ED Course. Radiology: ordered and independent interpretation performed. Decision-making details documented in ED Course.  Risk Prescription drug management. Decision regarding hospitalization.     Final diagnoses:  None    ED Discharge Orders     None          Patt Alm Macho, MD 05/20/24 [phone]  "

## 2024-05-20 NOTE — ED Notes (Signed)
 IV team at bedside

## 2024-05-20 NOTE — ED Notes (Signed)
 CBG 1853; Insulin  Drip

## 2024-05-20 NOTE — ED Notes (Signed)
 CCMD called by this RN

## 2024-05-20 NOTE — ED Triage Notes (Signed)
 Pt BIB [facility] from home due to edema all over body.  Pt does have edema to face.  Pt had dialysis to Sunday and Monday as makeup days.  CBG 596. VS BP 150/90, HR 86, Resp 18, SpO2 98%RA

## 2024-05-21 DIAGNOSIS — I5033 Acute on chronic diastolic (congestive) heart failure: Secondary | ICD-10-CM

## 2024-05-21 DIAGNOSIS — I10 Essential (primary) hypertension: Secondary | ICD-10-CM

## 2024-05-21 DIAGNOSIS — G40909 Epilepsy, unspecified, not intractable, without status epilepticus: Secondary | ICD-10-CM

## 2024-05-21 DIAGNOSIS — E785 Hyperlipidemia, unspecified: Secondary | ICD-10-CM

## 2024-05-21 LAB — CBG MONITORING, ED
Glucose-Capillary: 310 mg/dL — ABNORMAL HIGH (ref 70–99)
Glucose-Capillary: 244 mg/dL — ABNORMAL HIGH (ref 70–99)
Glucose-Capillary: 168 mg/dL — ABNORMAL HIGH (ref 70–99)
Glucose-Capillary: 109 mg/dL — ABNORMAL HIGH (ref 70–99)
Glucose-Capillary: 108 mg/dL — ABNORMAL HIGH (ref 70–99)
Glucose-Capillary: 63 mg/dL — ABNORMAL LOW (ref 70–99)
Glucose-Capillary: 22 mg/dL — CL (ref 70–99)
Glucose-Capillary: 31 mg/dL — CL (ref 70–99)
Glucose-Capillary: 61 mg/dL — ABNORMAL LOW (ref 70–99)
Glucose-Capillary: 13 mg/dL — CL (ref 70–99)
Glucose-Capillary: 85 mg/dL (ref 70–99)
Glucose-Capillary: 87 mg/dL (ref 70–99)
Glucose-Capillary: 116 mg/dL — ABNORMAL HIGH (ref 70–99)
Glucose-Capillary: 129 mg/dL — ABNORMAL HIGH (ref 70–99)

## 2024-05-21 LAB — BASIC METABOLIC PANEL WITH GFR
Anion gap: 25 — ABNORMAL HIGH (ref 5–15)
Anion gap: 20 — ABNORMAL HIGH (ref 5–15)
Anion gap: 18 — ABNORMAL HIGH (ref 5–15)
Anion gap: 13 (ref 5–15)
BUN: 48 mg/dL — ABNORMAL HIGH (ref 6–20)
BUN: 46 mg/dL — ABNORMAL HIGH (ref 6–20)
BUN: 47 mg/dL — ABNORMAL HIGH (ref 6–20)
BUN: 23 mg/dL — ABNORMAL HIGH (ref 6–20)
CO2: 16 mmol/L — ABNORMAL LOW (ref 22–32)
CO2: 18 mmol/L — ABNORMAL LOW (ref 22–32)
CO2: 22 mmol/L (ref 22–32)
CO2: 27 mmol/L (ref 22–32)
Calcium: 7.2 mg/dL — ABNORMAL LOW (ref 8.9–10.3)
Calcium: 7.2 mg/dL — ABNORMAL LOW (ref 8.9–10.3)
Calcium: 7.4 mg/dL — ABNORMAL LOW (ref 8.9–10.3)
Calcium: 7.3 mg/dL — ABNORMAL LOW (ref 8.9–10.3)
Chloride: 99 mmol/L (ref 98–111)
Chloride: 102 mmol/L (ref 98–111)
Chloride: 101 mmol/L (ref 98–111)
Chloride: 97 mmol/L — ABNORMAL LOW (ref 98–111)
Creatinine, Ser: 6.62 mg/dL — ABNORMAL HIGH (ref 0.44–1.00)
Creatinine, Ser: 6.54 mg/dL — ABNORMAL HIGH (ref 0.44–1.00)
Creatinine, Ser: 6.6 mg/dL — ABNORMAL HIGH (ref 0.44–1.00)
Creatinine, Ser: 3.98 mg/dL — ABNORMAL HIGH (ref 0.44–1.00)
GFR, Estimated: 8 mL/min — ABNORMAL LOW
GFR, Estimated: 8 mL/min — ABNORMAL LOW
GFR, Estimated: 8 mL/min — ABNORMAL LOW
GFR, Estimated: 15 mL/min — ABNORMAL LOW
Glucose, Bld: 181 mg/dL — ABNORMAL HIGH (ref 70–99)
Glucose, Bld: 72 mg/dL (ref 70–99)
Glucose, Bld: 83 mg/dL (ref 70–99)
Glucose, Bld: 206 mg/dL — ABNORMAL HIGH (ref 70–99)
Potassium: 3.8 mmol/L (ref 3.5–5.1)
Potassium: 3.8 mmol/L (ref 3.5–5.1)
Potassium: 4 mmol/L (ref 3.5–5.1)
Potassium: 3.9 mmol/L (ref 3.5–5.1)
Sodium: 140 mmol/L (ref 135–145)
Sodium: 139 mmol/L (ref 135–145)
Sodium: 140 mmol/L (ref 135–145)
Sodium: 137 mmol/L (ref 135–145)

## 2024-05-21 LAB — GLUCOSE, CAPILLARY
Glucose-Capillary: 49 mg/dL — ABNORMAL LOW (ref 70–99)
Glucose-Capillary: 55 mg/dL — ABNORMAL LOW (ref 70–99)
Glucose-Capillary: 71 mg/dL (ref 70–99)
Glucose-Capillary: 112 mg/dL — ABNORMAL HIGH (ref 70–99)
Glucose-Capillary: 196 mg/dL — ABNORMAL HIGH (ref 70–99)

## 2024-05-21 LAB — BETA-HYDROXYBUTYRIC ACID: Beta-Hydroxybutyric Acid: 1.08 mmol/L — ABNORMAL HIGH (ref 0.05–0.27)

## 2024-05-21 MED ORDER — OXYCODONE-ACETAMINOPHEN 5-325 MG PO TABS
1.0000 | ORAL_TABLET | Freq: Three times a day (TID) | ORAL | Status: DC | PRN
  Administered 2024-05-21: 1 via ORAL

## 2024-05-21 MED ORDER — OXYCODONE-ACETAMINOPHEN 5-325 MG PO TABS: 1.0000 | ORAL_TABLET | Freq: Three times a day (TID) | ORAL | Status: DC | PRN

## 2024-05-21 MED ORDER — DEXTROSE-SODIUM CHLORIDE 5-0.9 % IV SOLN: INTRAVENOUS | Status: DC

## 2024-05-21 MED ORDER — DEXTROSE 10 % IV SOLN: INTRAVENOUS | Status: DC

## 2024-05-21 MED ORDER — PENTAFLUOROPROP-TETRAFLUOROETH EX AERO: 1.0000 | INHALATION_SPRAY | CUTANEOUS | Status: DC | PRN

## 2024-05-21 MED ORDER — OXYCODONE-ACETAMINOPHEN 5-325 MG PO TABS
ORAL_TABLET | ORAL | Status: AC
  Filled 2024-05-21: qty 1

## 2024-05-21 MED ORDER — ANTICOAGULANT SODIUM CITRATE 4% (200MG/5ML) IV SOLN: 5.0000 mL | Status: DC | PRN

## 2024-05-21 MED ORDER — HYDROXYZINE HCL 25 MG PO TABS
25.0000 mg | ORAL_TABLET | Freq: Three times a day (TID) | ORAL | Status: DC | PRN
  Administered 2024-05-21: 25 mg via ORAL
  Filled 2024-05-21: qty 1

## 2024-05-21 MED ORDER — DEXTROSE 50 % IV SOLN: 25.0000 g | Freq: Once | INTRAVENOUS | Status: DC

## 2024-05-21 MED ORDER — LIDOCAINE-PRILOCAINE 2.5-2.5 % EX CREA: 1.0000 | TOPICAL_CREAM | CUTANEOUS | Status: DC | PRN

## 2024-05-21 MED ORDER — LACOSAMIDE 50 MG PO TABS
100.0000 mg | ORAL_TABLET | Freq: Two times a day (BID) | ORAL | Status: DC
  Administered 2024-05-21 – 2024-05-22 (×3): 100 mg via ORAL
  Filled 2024-05-21 (×3): qty 2

## 2024-05-21 MED ORDER — INSULIN GLARGINE-YFGN 100 UNIT/ML ~~LOC~~ SOLN
5.0000 [IU] | Freq: Every day | SUBCUTANEOUS | Status: DC
  Administered 2024-05-22 (×2): 5 [IU] via SUBCUTANEOUS
  Filled 2024-05-21 (×4): qty 0.05

## 2024-05-21 MED ORDER — MELATONIN 3 MG PO TABS
3.0000 mg | ORAL_TABLET | Freq: Once | ORAL | Status: AC
  Administered 2024-05-21: 3 mg via ORAL
  Filled 2024-05-21: qty 1

## 2024-05-21 MED ORDER — OXYCODONE-ACETAMINOPHEN 5-325 MG PO TABS
1.0000 | ORAL_TABLET | ORAL | Status: DC | PRN
  Administered 2024-05-22 (×3): 1 via ORAL
  Filled 2024-05-21 (×3): qty 1

## 2024-05-21 MED ORDER — DEXTROSE 5 % IV SOLN: INTRAVENOUS | Status: DC

## 2024-05-21 MED ORDER — INSULIN ASPART 100 UNIT/ML IJ SOLN
0.0000 [IU] | Freq: Every day | INTRAMUSCULAR | Status: DC
  Administered 2024-05-22: 3 [IU] via SUBCUTANEOUS
  Filled 2024-05-21: qty 1

## 2024-05-21 MED ORDER — HEPARIN SODIUM (PORCINE) 1000 UNIT/ML DIALYSIS: 1000.0000 [IU] | INTRAMUSCULAR | Status: DC | PRN

## 2024-05-21 MED ORDER — LIDOCAINE HCL (PF) 1 % IJ SOLN: 5.0000 mL | INTRAMUSCULAR | Status: DC | PRN

## 2024-05-21 MED ORDER — ALTEPLASE 2 MG IJ SOLR: 2.0000 mg | Freq: Once | INTRAMUSCULAR | Status: DC | PRN

## 2024-05-21 MED ORDER — DULOXETINE HCL 20 MG PO CPEP
20.0000 mg | ORAL_CAPSULE | Freq: Every day | ORAL | Status: DC
  Administered 2024-05-22: 20 mg via ORAL
  Filled 2024-05-21 (×2): qty 1

## 2024-05-21 MED ORDER — INSULIN ASPART 100 UNIT/ML IJ SOLN
0.0000 [IU] | Freq: Three times a day (TID) | INTRAMUSCULAR | Status: DC
  Administered 2024-05-22: 2 [IU] via SUBCUTANEOUS
  Filled 2024-05-21 (×2): qty 1

## 2024-05-21 MED ORDER — CARVEDILOL 25 MG PO TABS
25.0000 mg | ORAL_TABLET | Freq: Two times a day (BID) | ORAL | Status: DC
  Administered 2024-05-21 – 2024-05-22 (×3): 25 mg via ORAL
  Filled 2024-05-21: qty 1
  Filled 2024-05-21: qty 2
  Filled 2024-05-21: qty 1

## 2024-05-21 MED ORDER — SEVELAMER CARBONATE 800 MG PO TABS
800.0000 mg | ORAL_TABLET | Freq: Three times a day (TID) | ORAL | Status: DC
  Administered 2024-05-21 – 2024-05-22 (×4): 800 mg via ORAL
  Filled 2024-05-21 (×4): qty 1

## 2024-05-21 NOTE — Assessment & Plan Note (Signed)
 Anion gap this morning is 18 Patient is tolerating po well, she has developed hypoglycemia while on IV insulin  infusion.  Required multiple IV D50 and IV dextrose  infusion.   Plan to continue insulin  coverage with sliding scale, sensitive scale  Will resume long acting insulin  this evening 5 units For now unrestricted diet.  Follow up basic metabolic panel this pm.

## 2024-05-21 NOTE — Assessment & Plan Note (Signed)
 Continue statin

## 2024-05-21 NOTE — Assessment & Plan Note (Addendum)
 Continue blood pressure monitoring  Resume carvedilol 

## 2024-05-21 NOTE — ED Notes (Signed)
 Called HD to make them aware that pt is ready for dialysis. Staff Will reported that they will pick her up in 1+ hrs.

## 2024-05-21 NOTE — Assessment & Plan Note (Signed)
 08/2023 echocardiogram with reduced LV systolic function 30 to 35%, global hypokinesis, gradre II diastolic dysfunction with pseudo normalization, RV systolic function preserved, LA and RA with mild dilatation, severe tricuspid regurgitation.   Mild hypervolemia related to ESRD   Plan for ultrafiltration today on HD Continue blood pressure monitoring

## 2024-05-21 NOTE — Hospital Course (Addendum)
 Mrs. Ashley Freeman was admitted to the hospital with the working diagnosis of DKA.  34 yo female with the past medical history of type 1 DM, ESRD on HD, and bipolar disorder.  Multiple hospitalizations for DKA, last one 01/13 to 05/18/24 for uncontrolled diabetes hypo and hyperglycemia.  She had outpatient HD after her discharge. At home she noted severe generalized edema, prompting her to call EMS. She was found edematous and was transported to the ED.  On her initial physical examination her blood pressure was 145/86, HR 83, RR 20, 02 saturation 100% Lungs with coarse breath sounds and diffuse rales, heart with S2 and S2 present and regular, with no rubs or murmurs, abdomen with no distention and positive lower extremity edema ++.   VBG 7.17/ 24/ 199/ 10/ 9  Na 134, K 5.2 Cl 91, bicarbonate 8, glucose 759, bun 47 cr 6,23  Pro BNP > 35,000 High sensitive troponin 497   Patient was placed on IV insulin  for DKA. 01/22 hypoglycemia and insulin  infusion has held.

## 2024-05-21 NOTE — Assessment & Plan Note (Addendum)
 Resume as needed hydroxyzine   Chronic pain, resume duloxetine  and as needed oxycodone , dc morphine   Follow up as outpatient

## 2024-05-21 NOTE — ED Notes (Signed)
 Provider was made aware of critical CBG of 31 via secure chat and in person. Provider is aware and acknowledged receiving the critical result.

## 2024-05-21 NOTE — ED Notes (Addendum)
 Secure chat sent to Dr. Noralee to report her critical result of CBG of 22. Repeated test twice. Administered D50.  Pt reports being hungry and in generalized pain of 10/10. Requested for IVF order with dextrose . Provided stated that she can be PO at this time. Provided pt with juice as she was Aox4. Left upper arm PIV occluded. 18 G PIV placed - patent and returning blood. Pt tolerated the procedure well. Requested provider to come assess her in person. Provider replied that he will come see her.   Fall precautions maintained.

## 2024-05-21 NOTE — ED Notes (Addendum)
 Pt hypoxic on RA at 85%. Placed her on 4lpm O2 via [place]. Provider was made aware

## 2024-05-21 NOTE — ED Notes (Signed)
 Provider at bedside.

## 2024-05-21 NOTE — Progress Notes (Signed)
 Pt upset that her neck is hurting and nobody has looked at it. Looked in pt throat no redness or irration noted. Pt currently eating crackers, broth and drinking diet coke. Pt states that the only medication that helps her is IV pain meds explain that Mds were not going to add IV pain med. Pt then stated she wanted to be transferred to another hospital explained to pt that a transfer was unlikely since she was at a hospital that can meet her medical needs and the only way for her to get to the hospital of her choice was to have someone come pick her up and transport her to that facility.  Pt has been aggressive and cursing the majority of the time she has been on the unit further explained to pt she could not curse at this nurse and I could have the charge nurse talk with her about getting another nurse. Pt then apologized no longer wants to see the charge nurse and stated she will take the PO meds ordered and have a conversation without cursing with the rounding MD in the am.

## 2024-05-21 NOTE — Progress Notes (Signed)
 Pt receives out-pt HD at [facility] on TTS 11:00 am chair time. Will assist as needed.    Laray Rivkin Dialysis Navigator [phone]

## 2024-05-21 NOTE — Assessment & Plan Note (Signed)
 No active seizures Continue with dimpat

## 2024-05-21 NOTE — Assessment & Plan Note (Signed)
 Hyponatremia, hypokalemia  She reports missing one HD outpatient session prior to admission  Patient will have HD today with ultrafiltration

## 2024-05-21 NOTE — Progress Notes (Signed)
 " Progress Note   Patient: Ashley Freeman FMW:[id number] DOB: 01-24-1991 DOA: 05/20/2024     1 DOS: the patient was seen and examined on 05/21/2024   Brief hospital course: Ashley Freeman was admitted to the hospital with the working diagnosis of DKA.  34 yo female with the past medical history of type 1 DM, ESRD on HD, and bipolar disorder.  Multiple hospitalizations for DKA, last one 01/13 to 05/18/24 for uncontrolled diabetes hypo and hyperglycemia.  She had outpatient HD after her discharge. At home she noted severe generalized edema, prompting her to call EMS. She was found edematous and was transported to the ED.  On her initial physical examination her blood pressure was 145/86, HR 83, RR 20, 02 saturation 100% Lungs with coarse breath sounds and diffuse rales, heart with S2 and S2 present and regular, with no rubs or murmurs, abdomen with no distention and positive lower extremity edema ++.   VBG 7.17/ 24/ 199/ 10/ 9  Na 134, K 5.2 Cl 91, bicarbonate 8, glucose 759, bun 47 cr 6,23  Pro BNP > 35,000 High sensitive troponin 497   Patient was placed on IV insulin  for DKA. 01/22 hypoglycemia and insulin  infusion has held.   Assessment and Plan: Diabetic ketoacidosis associated with type 1 diabetes mellitus (HCC) Anion gap this morning is 18 Patient is tolerating po well, she has developed hypoglycemia while on IV insulin  infusion.  Required multiple IV D50 and IV dextrose  infusion.   Plan to continue insulin  coverage with sliding scale, sensitive scale  Will resume long acting insulin  this evening 5 units For now unrestricted diet.  Follow up basic metabolic panel this pm.   Acute on chronic diastolic heart failure (HCC) 08/2023 echocardiogram with reduced LV systolic function 30 to 35%, global hypokinesis, gradre II diastolic dysfunction with pseudo normalization, RV systolic function preserved, LA and RA with mild dilatation, severe tricuspid  regurgitation.   Mild hypervolemia related to ESRD   Plan for ultrafiltration today on HD Continue blood pressure monitoring   ESRD (end stage renal disease) (HCC) Hyponatremia, hypokalemia  She reports missing one HD outpatient session prior to admission  Patient will have HD today with ultrafiltration   Seizure disorder (HCC) No active seizures Continue with dimpat   Essential hypertension Continue blood pressure monitoring  Resume carvedilol    Dyslipidemia Continue statin   Bipolar disorder (HCC) Resume as needed hydroxyzine   Chronic pain, resume duloxetine  and as needed oxycodone , dc morphine   Follow up as outpatient         Subjective: Patient with no dyspnea, nausea or vomiting, no abdominal pain, she is tolerating po well, complains of generalized pain   Physical Exam: Vitals:   05/21/24 0330 05/21/24 0800 05/21/24 0830 05/21/24 1200  BP: (!) 163/70 (!) 155/82 (!) 177/77 (!) 134/112  Pulse: 81 81 82 81  Resp: 16 (!) 5 (!) 27 19  Temp:  98.5 F (36.9 C)    TempSrc:  Oral    SpO2: 98% 94% 93% 97%  Weight:      Height:       Neurology somnolent but easy to arouse, follows commands and responds to questions, ENT with mild pallor with no icterus Cardiovascular with S1 and S2 present and regular with no gallops, rubs or murmurs Respiratory with mid rales at bases with no wheezing or rhonchi Abdomen with no distention, soft and non tender Trace lower extremity edema   Data Reviewed:    Family Communication: no family at the  bedside   Disposition: Status is: Inpatient Remains inpatient appropriate because: hyper and hypoglycemia   Planned Discharge Destination: Home  Author: Elidia Toribio Furnace, MD 05/21/2024 12:25 PM  For on call review [email or web address].  "

## 2024-05-21 NOTE — Inpatient Diabetes Management (Addendum)
 Inpatient Diabetes Program Recommendations  AACE/ADA: New Consensus Statement on Inpatient Glycemic Control (2015)  Target Ranges:  Prepandial:   less than 140 mg/dL      Peak postprandial:   less than 180 mg/dL (1-2 hours)      Critically ill patients:  140 - 180 mg/dL   Lab Results  Component Value Date   GLUCAP 61 (L) 05/21/2024   HGBA1C 8.5 (H) 03/30/2024    Latest Reference Range & Units 05/21/24 05:04 05/21/24 06:05 05/21/24 06:24 05/21/24 08:27 05/21/24 08:55  Glucose-Capillary 70 - 99 mg/dL 890 (H) 63 (L) 891 (H) 22 (LL) 61 (L)   Review of Glycemic Control  Diabetes history: DM1  Outpatient Diabetes medications:  Lantus  5 units daily Novolog  2 units TID with meals plus 2-8 units TID for correction  Current orders for Inpatient glycemic control:   IV insulin  transitioned to Novolog  0-6 units TID + 0-5 units at bedtime   Inpatient Diabetes Program Recommendations:   Noted patient has 16 Inpatient admissions and 7 ED admissions within the past 6 months.   Patient has Type 1 DM and is very sensitive to insulin  and also has ESRD.    Please consider Lantus  5 units Q24H, Novolog  2 units TID with meals if patient eats at least 50% of meal, and Novolog  0-6 unitsTID + 0-5 units at bedtime for correction.    Thanks,  Lavanda Search, RN, MSN, [facility]  Inpatient Diabetes Coordinator  Pager [phone] (8a-5p)

## 2024-05-21 NOTE — ED Notes (Signed)
 HOSPTIAL BED ORDERED

## 2024-05-21 NOTE — Progress Notes (Signed)
 [facility] Progress Note  Subjective:  Pt seen in HD unit No c/o's at this time BP's high on HD 188/96, HR 86  Presentation summary: 34 y.o. female with history of uncontrolled diabetes followed by endocrinology recently admitted to [facility] for DKA and just discharged with last dialysis treatment on Monday which was a shortened treatment because of plan for discharge.  Patient now back and missed dialysis because of transportation issues, has not been eating which led her to not use her insulin  now presenting with abdominal pain and nausea.  She denies any fever, chills, shortness of breath but does have diffuse edema; she knows she is well up on her dry weight.   Vitals:   05/21/24 1430 05/21/24 1500 05/21/24 1530 05/21/24 1600  BP: 109/82 (!) 163/89 (!) 174/96 (!) 188/96  Pulse: 85 85 88 85  Resp: 16 12 14 20   Temp:      TempSrc:      SpO2: 95% 97% 100% 97%  Weight:      Height:        Exam: Gen alert & oriented x3 No jvd or bruits Chest CTA b/l RRR no MRG Abd soft ntnd no mass, 1-2+ ascites +bs Ext tr -1+ pitting edema LEs or UEs Neuro moving all 4 ext RUE AVG+ bruit, no sig hematoma   Home bp meds: Coreg   Bumex  1 mg daily    OP HD: TTS [facility] ([address]) 3h  B400  57.5kg  2K bath  R AVG  Hep 1600   Assessment/ Plan:   # ESRD - on HD TTS in [place] - recently admitted at [facility] with last dialysis Monday  - No absolute indication for dialysis  - chronically vol overloaded, well above EDW at d/c Monday - in HD unit this afternoon     # HTN - BP's up 140-150/80's - resume home bp meds prn per pmd     # Volume - 1+ LE edema - up by wts -> about 12kg over  - not really hypoxic very much - max UF w/ HD today and Sat      # Anemia of esrd - will check iron  stores; transfuse if Hb <7    # DKA  - per pmd     Myer Fret MD  [facility] 05/21/2024, 4:12 PM  Recent Labs  Lab 05/20/24 1719 05/20/24 2038 05/20/24 2039  05/21/24 0645 05/21/24 0852  HGB 9.9* 8.2*  --   --   --   CALCIUM   --   --    < > 7.2* 7.4*  CREATININE  --   --    < > 6.54* 6.60*  K 5.0  --    < > 3.8 4.0   < > = values in this interval not displayed.   No results for input(s): IRON , TIBC, FERRITIN in the last 168 hours. Inpatient medications:  carvedilol   25 mg Oral BID WC   Chlorhexidine  Gluconate Cloth  6 each Topical Q0600   dextrose   25 g Intravenous Once   DULoxetine   20 mg Oral Daily   heparin   5,000 Units Subcutaneous Q8H   insulin  aspart  0-5 Units Subcutaneous QHS   insulin  aspart  0-6 Units Subcutaneous TID WC   insulin  glargine-yfgn  5 Units Subcutaneous Q2200   lacosamide   100 mg Oral BID   sevelamer  carbonate  800 mg Oral TID WC    anticoagulant sodium citrate      dextrose  50 mL/hr at 05/21/24  1354   alteplase , anticoagulant sodium citrate , dextrose , heparin , hydrOXYzine , lidocaine  (PF), lidocaine -prilocaine , oxyCODONE -acetaminophen , pentafluoroprop-tetrafluoroeth

## 2024-05-22 DIAGNOSIS — E1010 Type 1 diabetes mellitus with ketoacidosis without coma: Secondary | ICD-10-CM | POA: Diagnosis not present

## 2024-05-22 LAB — GLUCOSE, CAPILLARY
Glucose-Capillary: 105 mg/dL — ABNORMAL HIGH (ref 70–99)
Glucose-Capillary: 131 mg/dL — ABNORMAL HIGH (ref 70–99)
Glucose-Capillary: 136 mg/dL — ABNORMAL HIGH (ref 70–99)
Glucose-Capillary: 183 mg/dL — ABNORMAL HIGH (ref 70–99)
Glucose-Capillary: 185 mg/dL — ABNORMAL HIGH (ref 70–99)
Glucose-Capillary: 187 mg/dL — ABNORMAL HIGH (ref 70–99)
Glucose-Capillary: 228 mg/dL — ABNORMAL HIGH (ref 70–99)
Glucose-Capillary: 229 mg/dL — ABNORMAL HIGH (ref 70–99)

## 2024-05-22 LAB — RENAL FUNCTION PANEL
Albumin: 3.2 g/dL — ABNORMAL LOW (ref 3.5–5.0)
Anion gap: 14 (ref 5–15)
BUN: 24 mg/dL — ABNORMAL HIGH (ref 6–20)
CO2: 25 mmol/L (ref 22–32)
Calcium: 7.7 mg/dL — ABNORMAL LOW (ref 8.9–10.3)
Chloride: 98 mmol/L (ref 98–111)
Creatinine, Ser: 4.3 mg/dL — ABNORMAL HIGH (ref 0.44–1.00)
GFR, Estimated: 13 mL/min — ABNORMAL LOW
Glucose, Bld: 186 mg/dL — ABNORMAL HIGH (ref 70–99)
Phosphorus: 3.9 mg/dL (ref 2.5–4.6)
Potassium: 3.9 mmol/L (ref 3.5–5.1)
Sodium: 136 mmol/L (ref 135–145)

## 2024-05-22 LAB — HEPATITIS B SURFACE ANTIBODY, QUANTITATIVE: Hep B S AB Quant (Post): <3.5 m[IU]/mL — ABNORMAL LOW

## 2024-05-22 MED ORDER — METHOCARBAMOL 1000 MG/10ML IJ SOLN: 500.0000 mg | Freq: Three times a day (TID) | INTRAMUSCULAR | Status: DC | PRN

## 2024-05-22 MED ORDER — HYDROXYZINE HCL 25 MG PO TABS
25.0000 mg | ORAL_TABLET | Freq: Once | ORAL | Status: AC
  Administered 2024-05-22: 25 mg via ORAL
  Filled 2024-05-22: qty 1

## 2024-05-22 MED ORDER — CHLORHEXIDINE GLUCONATE CLOTH 2 % EX PADS
6.0000 | MEDICATED_PAD | Freq: Every day | CUTANEOUS | Status: DC
  Administered 2024-05-23: 6 via TOPICAL

## 2024-05-22 MED ORDER — CYCLOBENZAPRINE HCL 5 MG PO TABS
5.0000 mg | ORAL_TABLET | Freq: Three times a day (TID) | ORAL | Status: DC | PRN
  Administered 2024-05-22 (×2): 5 mg via ORAL
  Filled 2024-05-22 (×2): qty 1

## 2024-05-22 MED ORDER — DICLOFENAC SODIUM 1 % EX GEL
2.0000 g | Freq: Four times a day (QID) | CUTANEOUS | Status: DC
  Administered 2024-05-22: 2 g via TOPICAL
  Filled 2024-05-22: qty 100

## 2024-05-22 NOTE — Care Management Important Message (Signed)
 Important Message  Patient Details  Name: Ashley Freeman MRN: [id number] Date of Birth: 22-May-1990   Important Message Given:  Yes - Medicare IM     Jennie Laneta Dragon 05/22/2024, 4:35 PM

## 2024-05-22 NOTE — Plan of Care (Signed)

## 2024-05-22 NOTE — Progress Notes (Signed)
" °  Pt receives out-pt HD at [facility] on TTS      Patient has transportation arranged through medicaid, which she takes to dialysis appts.    Patient has no DME at home, does have PCP, manages own medications.   No TOC needs identified at this time.   "

## 2024-05-22 NOTE — Progress Notes (Signed)
 [facility] Progress Note  Subjective:  Pt seen in HD unit 4 L off w/ HD yesterday Feeling better  Presentation summary: 34 y.o. female with history of uncontrolled diabetes followed by endocrinology recently admitted to [facility] for DKA and just discharged with last dialysis treatment on Monday which was a shortened treatment because of plan for discharge.  Patient now back and missed dialysis because of transportation issues, has not been eating which led her to not use her insulin  now presenting with abdominal pain and nausea.  She denies any fever, chills, shortness of breath but does have diffuse edema; she knows she is well up on her dry weight.   Vitals:   05/22/24 0419 05/22/24 0803 05/22/24 1149 05/22/24 1604  BP: (!) 141/62 131/74    Pulse: 76 80 76 83  Resp:  20 18 20   Temp: 97.9 F (36.6 C) 98 F (36.7 C) 98.5 F (36.9 C) 98.2 F (36.8 C)  TempSrc: Oral Oral Oral Oral  SpO2: 94%     Weight:      Height:        Exam: Gen alert & oriented x3 No jvd or bruits Chest CTA b/l RRR no MRG Abd soft ntnd no mass, 1-2+ ascites +bs Ext 1+ pitting edema LEs Neuro moving all 4 ext RUE AVG+ bruit, no sig hematoma   Home bp meds: Coreg   Bumex  1 mg daily    OP HD: TTS [facility] ([address]) 3h  B400  57.5kg  2K bath  R AVG  Hep 1600   Assessment/ Plan:   # ESRD - on HD TTS in [place] - recently admitted at [facility] with last dialysis Monday  - chronically vol overloaded, well above EDW at d/c Monday - had HD here yesterday - next HD Sat      # HTN - BP's up 140-150/80's - getting coreg  po     # Volume - 1+ LE edema - up by wts -> about 12kg over, chronic issue - max UF 4-5 L w/ next HD     # Anemia of esrd - will check iron  stores; transfuse if Hb <7    # DKA  - per pmd     Myer Fret MD  [facility] 05/22/2024, 4:18 PM  Recent Labs  Lab 05/20/24 1719 05/20/24 2038 05/20/24 2039 05/21/24 2233 05/22/24 0550  HGB 9.9* 8.2*   --   --   --   ALBUMIN   --   --   --   --  3.2*  CALCIUM   --   --    < > 7.3* 7.7*  PHOS  --   --   --   --  3.9  CREATININE  --   --    < > 3.98* 4.30*  K 5.0  --    < > 3.9 3.9   < > = values in this interval not displayed.   No results for input(s): IRON , TIBC, FERRITIN in the last 168 hours. Inpatient medications:  carvedilol   25 mg Oral BID WC   Chlorhexidine  Gluconate Cloth  6 each Topical Q0600   dextrose   25 g Intravenous Once   diclofenac  Sodium  2 g Topical QID   DULoxetine   20 mg Oral Daily   heparin   5,000 Units Subcutaneous Q8H   insulin  aspart  0-5 Units Subcutaneous QHS   insulin  aspart  0-6 Units Subcutaneous TID WC   insulin  glargine-yfgn  5 Units Subcutaneous Q2200   lacosamide   100 mg Oral BID   sevelamer  carbonate  800 mg Oral TID WC     cyclobenzaprine , dextrose , hydrOXYzine , oxyCODONE -acetaminophen 

## 2024-05-22 NOTE — Progress Notes (Signed)
" °   05/22/24 1730  What Happened  Was fall witnessed? No  Was patient injured? No  Patient found on floor (pt found leaning on bed with R knee on floor)  Found by Staff-comment (RN)  Stated prior activity to/from bed, chair, or stretcher  Provider Notification  Provider Name/Title Elegergawy  Date Provider Notified 05/22/24  Time Provider Notified 1730  Method of Notification Face-to-face  Notification Reason Fall  Provider response No new orders  Adult Fall Risk Assessment  Risk Factor Category (scoring not indicated) Fall has occurred during this admission (document High fall risk)  Patient Fall Risk Level High fall risk  Adult Fall Risk Interventions  Required Bundle Interventions *See Row Information* High fall risk  Additional Interventions Use of appropriate toileting equipment (bedpan, BSC, etc.)  Screening for Fall Injury Risk (To be completed on HIGH fall risk patients) - Assessing Need for Floor Mats  Risk For Fall Injury- Criteria for Floor Mats Previous fall this admission  Pain Assessment  Pain Scale 0-10  Pain Score 8  Pain Location Knee  Pain Intervention(s) Medication (See eMAR)  Neurological  Neuro (WDL) WDL  Level of Consciousness Alert  Orientation Level Oriented X4  Glasgow Coma Scale  Eye Opening 4  Best Verbal Response (NON-intubated) 5  Best Motor Response 6  Glasgow Coma Scale Score 15  Musculoskeletal  Musculoskeletal (WDL) X  Generalized Weakness Yes  Integumentary  Integumentary (WDL) X  Skin Integrity Ecchymosis  Ecchymosis Location Arm  Ecchymosis Location Orientation Bilateral    "

## 2024-05-22 NOTE — Progress Notes (Signed)
 " PROGRESS NOTE    Ashley Freeman  FMW:[id number] DOB: 02/22/1991 DOA: 05/20/2024 PCP: Keven Crumbly Pap, MD      Chief Complaint  Patient presents with   Facial Swelling    Brief Narrative:   Mrs. Ashley Freeman was admitted to the hospital with the working diagnosis of DKA.   34 yo female with the past medical history of type 1 DM, ESRD on HD, and bipolar disorder.  Multiple hospitalizations for DKA, last one 01/13 to 05/18/24 for uncontrolled diabetes hypo and hyperglycemia.  She had outpatient HD after her discharge. At home she noted severe generalized edema, prompting her to call EMS. She was found edematous and was transported to the ED.  On her initial physical examination her blood pressure was 145/86, HR 83, RR 20, 02 saturation 100% Lungs with coarse breath sounds and diffuse rales, heart with S2 and S2 present and regular, with no rubs or murmurs, abdomen with no distention and positive lower extremity edema ++.    VBG 7.17/ 24/ 199/ 10/ 9   Na 134, K 5.2 Cl 91, bicarbonate 8, glucose 759, bun 47 cr 6,23  Pro BNP > 35,000 High sensitive troponin 497    Patient was placed on IV insulin  for DKA. 01/22 hypoglycemia and insulin  infusion has held.      Assessment & Plan:   Active Problems:   Diabetic ketoacidosis associated with type 1 diabetes mellitus (HCC)   Acute on chronic diastolic heart failure (HCC)   ESRD (end stage renal disease) (HCC)   Seizure disorder (HCC)   Essential hypertension   Dyslipidemia   Bipolar disorder (HCC)  Diabetic ketoacidosis associated with type 1 diabetes mellitus (HCC) -Multiple admissions in the past for DKA - Initially on insulin  drip, anion gap has closed, transition to subcu insulin , she was hypoglycemic yesterday requiring D5 drip, no further hypoglycemia, she is off insulin  drip -CBG appears controlled on current insulin  sliding scale and Levemir  5 units -Diabetic coordinator input greatly  appreciated -A1c this admission is pending   Acute on chronic diastolic heart failure (HCC) 08/2023 echocardiogram with reduced LV systolic function 30 to 35%, global hypokinesis, gradre II diastolic dysfunction with pseudo normalization, RV systolic function preserved, LA and RA with mild dilatation, severe tricuspid regurgitation.   -Evidence of volume overload on imaging, renal input greatly appreciated, as volume management with HD, still evidence of volume overload, plan for HD tomorrow with maximum ultrafiltration, as she above 12 kg over her weight this admission   ESRD (end stage renal disease) (HCC) Hyponatremia, hypokalemia  - Management per renal, please see above discussion   Seizure disorder (HCC) No active seizures Continue with dimpat    Essential hypertension Continue blood pressure monitoring  Resume carvedilol     Dyslipidemia Continue statin    Bipolar disorder (HCC) Resume as needed hydroxyzine   Chronic pain, resume duloxetine  and as needed oxycodone , dc morphine   Follow up as outpatient    Anemia of chronic kidney disease -IV iron  and iron  as per renal   Neck pain -patient complains of neck pain, appears to be in her trapezius muscle area, but no edema, no swelling or warmth, improved with Voltaren  gel and Flexeril     DVT prophylaxis: (Heparin )  code Status: (Full) Family Communication: (None at bedside Disposition:   Status is: Inpatient    Consultants:  Nephrology  Subjective:  Patient has been complaining of neck pain overnight, otherwise afebrile, no dyspnea, no difficulty swallowing  Objective: Vitals:   05/22/24 0008 05/22/24  0419 05/22/24 0803 05/22/24 1149  BP: (!) 149/65 (!) 141/62 131/74   Pulse: 79 76 80 76  Resp:   20 18  Temp: 98.1 F (36.7 C) 97.9 F (36.6 C) 98 F (36.7 C) 98.5 F (36.9 C)  TempSrc: Oral Oral Oral Oral  SpO2: 92% 94%    Weight:      Height:        Intake/Output Summary (Last 24 hours) at 05/22/2024  1614 Last data filed at 05/21/2024 1906 Gross per 24 hour  Intake 265.42 ml  Output 4000 ml  Net -3734.58 ml   Filed Weights   05/20/24 1623  Weight: 69 kg    Examination:  Awake Alert, Oriented X 3, No new F.N deficits, Normal affect CTAB RRR,No Gallops +ve B.Sounds, Abd Soft No Cyanosis, Clubbing or edema  Patient was seen with her nurse Dasie at bedside    Data Reviewed: I have personally reviewed following labs and imaging studies  CBC: Recent Labs  Lab 05/20/24 1632 05/20/24 1707 05/20/24 1719 05/20/24 2038  WBC 10.4  --   --  11.3*  NEUTROABS 5.4  --   --   --   HGB 8.4* 10.2* 9.9* 8.2*  HCT 29.1* 30.0* 29.0* 27.4*  MCV 92.4  --   --  88.1  PLT 300  --   --  305    Basic Metabolic Panel: Recent Labs  Lab 05/21/24 0330 05/21/24 0645 05/21/24 0852 05/21/24 2233 05/22/24 0550  NA 140 139 140 137 136  K 3.8 3.8 4.0 3.9 3.9  CL 99 102 101 97* 98  CO2 16* 18* 22 27 25   GLUCOSE 181* 72 83 206* 186*  BUN 48* 46* 47* 23* 24*  CREATININE 6.62* 6.54* 6.60* 3.98* 4.30*  CALCIUM  7.2* 7.2* 7.4* 7.3* 7.7*  PHOS  --   --   --   --  3.9    GFR: Estimated Creatinine Clearance: 17.7 mL/min (A) (by C-G formula based on SCr of 4.3 mg/dL (H)).  Liver Function Tests: Recent Labs  Lab 05/22/24 0550  ALBUMIN  3.2*    CBG: Recent Labs  Lab 05/22/24 0606 05/22/24 0802 05/22/24 1150 05/22/24 1421 05/22/24 1603  GLUCAP 187* 229* 136* 105* 131*     No results found for this or any previous visit (from the past 240 hours).       Radiology Studies: CT ABDOMEN PELVIS WO CONTRAST Result Date: 05/20/2024 EXAM: CT ABDOMEN AND PELVIS WITHOUT CONTRAST 05/20/2024 05:21:43 PM TECHNIQUE: CT of the abdomen and pelvis was performed without the administration of intravenous contrast. Multiplanar reformatted images are provided for review. Automated exposure control, iterative reconstruction, and/or weight-based adjustment of the mA/kV was utilized to reduce the  radiation dose to as low as reasonably achievable. COMPARISON: 03/08/2024 CLINICAL HISTORY: Abdominal pain, acute, nonlocalized; ab pain r/o SBO. Patient has hx of ESRD on HD. FINDINGS: LOWER CHEST: Small right and trace left pleural effusions. Patchy ground glass opacities in the lung bases, possibly atelectasis or developing edema. LIVER: The liver is unremarkable. GALLBLADDER AND BILE DUCTS: Cholecystectomy. No biliary ductal dilatation. SPLEEN: No acute abnormality. PANCREAS: No acute abnormality. ADRENAL GLANDS: No acute abnormality. KIDNEYS, URETERS AND BLADDER: Renal cortical atrophy bilaterally, consistent with chronic medical renal disease. No stones in the kidneys or ureters. No hydronephrosis. No perinephric or periureteral stranding. Plate decompression of the urinary bladder. GI AND BOWEL: Stomach demonstrates no acute abnormality. Gas filled nondistended appendix. There is no bowel obstruction. PERITONEUM AND RETROPERITONEUM: Large volume ascites. No  free air. VASCULATURE: Aorta is normal in caliber. Diffuse [place] calcification throughout the mesenteric vessels. LYMPH NODES: No lymphadenopathy. REPRODUCTIVE ORGANS: The uterus and ovaries are within normal limits for patients age. BONES AND SOFT TISSUES: No acute osseous abnormality. Severe, diffuse anasarca. IMPRESSION: 1. No acute intraabdominal or pelvic abnormality. 2. Severe, diffuse anasarca and large volume ascites with small right and trace left pleural effusions, likely related to the patient's volume status. 3. Patchy ground glass opacities in the lung bases, possibly representing atelectasis or developing pulmonary edema. Electronically signed by: Rogelia Myers MD 05/20/2024 05:33 PM EST RP Workstation: [id number]   DG Chest Port 1 View Result Date: 05/20/2024 EXAM: 1 VIEW(S) XRAY OF THE CHEST 05/20/2024 05:02:04 PM COMPARISON: 05/12/2024 CLINICAL HISTORY: SOB SOB SOB SOB SOB FINDINGS: LINES, TUBES AND DEVICES: Cardiac leads in  place. LUNGS AND PLEURA: Underinflation. No focal pulmonary opacity. No pleural effusion. No pneumothorax. HEART AND MEDIASTINUM: Cardiomegaly. BONES AND SOFT TISSUES: No acute osseous abnormality. IMPRESSION: 1. Cardiomegaly. 2. No acute findings. Electronically signed by: Greig Pique MD 05/20/2024 05:10 PM EST RP Workstation: [id number]        Scheduled Meds:  carvedilol   25 mg Oral BID WC   Chlorhexidine  Gluconate Cloth  6 each Topical Q0600   dextrose   25 g Intravenous Once   diclofenac  Sodium  2 g Topical QID   DULoxetine   20 mg Oral Daily   heparin   5,000 Units Subcutaneous Q8H   insulin  aspart  0-5 Units Subcutaneous QHS   insulin  aspart  0-6 Units Subcutaneous TID WC   insulin  glargine-yfgn  5 Units Subcutaneous Q2200   lacosamide   100 mg Oral BID   sevelamer  carbonate  800 mg Oral TID WC   Continuous Infusions:   LOS: 2 days    Brayton Lye, MD [facility]   To contact the attending provider between 7A-7P or the covering provider during after hours 7P-7A, please log into the web site [email or web address] and access using universal [place] password for that web site. If you do not have the password, please call the hospital operator.  05/22/2024, 4:14 PM   "

## 2024-05-23 DIAGNOSIS — E1010 Type 1 diabetes mellitus with ketoacidosis without coma: Secondary | ICD-10-CM | POA: Diagnosis not present

## 2024-05-23 LAB — CBC
HCT: 26.6 % — ABNORMAL LOW (ref 36.0–46.0)
Hemoglobin: 8.3 g/dL — ABNORMAL LOW (ref 12.0–15.0)
MCH: 26.6 pg (ref 26.0–34.0)
MCHC: 31.2 g/dL (ref 30.0–36.0)
MCV: 85.3 fL (ref 80.0–100.0)
Platelets: 239 10*3/uL (ref 150–400)
RBC: 3.12 MIL/uL — ABNORMAL LOW (ref 3.87–5.11)
RDW: 16.1 % — ABNORMAL HIGH (ref 11.5–15.5)
WBC: 9.8 10*3/uL (ref 4.0–10.5)
nRBC: 0.2 % (ref 0.0–0.2)

## 2024-05-23 LAB — GLUCOSE, CAPILLARY
Glucose-Capillary: 118 mg/dL — ABNORMAL HIGH (ref 70–99)
Glucose-Capillary: 81 mg/dL (ref 70–99)
Glucose-Capillary: 84 mg/dL (ref 70–99)

## 2024-05-23 LAB — RENAL FUNCTION PANEL
Albumin: 3.2 g/dL — ABNORMAL LOW (ref 3.5–5.0)
Anion gap: 11 (ref 5–15)
BUN: 28 mg/dL — ABNORMAL HIGH (ref 6–20)
CO2: 25 mmol/L (ref 22–32)
Calcium: 8 mg/dL — ABNORMAL LOW (ref 8.9–10.3)
Chloride: 103 mmol/L (ref 98–111)
Creatinine, Ser: 5.11 mg/dL — ABNORMAL HIGH (ref 0.44–1.00)
GFR, Estimated: 11 mL/min — ABNORMAL LOW
Glucose, Bld: 99 mg/dL (ref 70–99)
Phosphorus: 5.6 mg/dL — ABNORMAL HIGH (ref 2.5–4.6)
Potassium: 4.6 mmol/L (ref 3.5–5.1)
Sodium: 139 mmol/L (ref 135–145)

## 2024-05-23 MED ORDER — LIDOCAINE-PRILOCAINE 2.5-2.5 % EX CREA: 1.0000 | TOPICAL_CREAM | CUTANEOUS | Status: DC | PRN

## 2024-05-23 MED ORDER — ALTEPLASE 2 MG IJ SOLR: 2.0000 mg | Freq: Once | INTRAMUSCULAR | Status: DC | PRN

## 2024-05-23 MED ORDER — HEPARIN SODIUM (PORCINE) 1000 UNIT/ML DIALYSIS: 1500.0000 [IU] | Freq: Once | INTRAMUSCULAR | Status: DC

## 2024-05-23 MED ORDER — HEPARIN SODIUM (PORCINE) 1000 UNIT/ML DIALYSIS: 1500.0000 [IU] | INTRAMUSCULAR | Status: DC | PRN

## 2024-05-23 MED ORDER — HEPARIN SODIUM (PORCINE) 1000 UNIT/ML DIALYSIS: 1000.0000 [IU] | INTRAMUSCULAR | Status: DC | PRN

## 2024-05-23 MED ORDER — PENTAFLUOROPROP-TETRAFLUOROETH EX AERO: 1.0000 | INHALATION_SPRAY | CUTANEOUS | Status: DC | PRN

## 2024-05-23 MED ORDER — LIDOCAINE HCL (PF) 1 % IJ SOLN: 5.0000 mL | INTRAMUSCULAR | Status: DC | PRN

## 2024-05-23 MED ORDER — ANTICOAGULANT SODIUM CITRATE 4% (200MG/5ML) IV SOLN: 5.0000 mL | Status: DC | PRN

## 2024-05-23 MED ORDER — HEPARIN SODIUM (PORCINE) 1000 UNIT/ML DIALYSIS: 2000.0000 [IU] | Freq: Once | INTRAMUSCULAR | Status: DC

## 2024-05-23 NOTE — Progress Notes (Signed)
 [facility] Progress Note  Subjective:  Pt seen in room No c/o's today, due for HD today  Presentation summary: 34 y.o. female with history of uncontrolled diabetes followed by endocrinology recently admitted to [facility] for DKA and just discharged with last dialysis treatment on Monday which was a shortened treatment because of plan for discharge.  Patient now back and missed dialysis because of transportation issues, has not been eating which led her to not use her insulin  now presenting with abdominal pain and nausea.  She denies any fever, chills, shortness of breath but does have diffuse edema; she knows she is well up on her dry weight.   Vitals:   05/23/24 0900 05/23/24 0915 05/23/24 0930 05/23/24 0935  BP: (!) 154/95  (!) 164/132 129/62  Pulse:  80 77 77  Resp: 11 13 19 16   Temp: 98.2 F (36.8 C)     TempSrc:      SpO2: 98% 98% 99% 99%  Weight: 69.2 kg     Height:        Exam: Gen alert & oriented x3 No jvd or bruits Chest CTA b/l RRR no MRG Abd soft ntnd no mass, 1-2+ ascites +bs Ext 1+ pitting edema LEs Neuro moving all 4 ext RUE AVG+ bruit, no sig hematoma   Home bp meds: Coreg   Bumex  1 mg daily    OP HD: TTS [facility] ([address]) 3h  B400  57.5kg  2K bath  R AVG  Hep 1600   Assessment/ Plan:   # ESRD - on HD TTS in [place] - recently admitted at [facility] with last dialysis Monday  - chronically vol overloaded - had HD here Thursday  - next HD today      # HTN - BP's up 140s/ 90s - getting coreg  po     # Volume - 1+ LE edema - up by wts -> usually 8-12 kg over, chronic issue - max UF 4-5 L w/ HD today     # Anemia of esrd - will check iron  stores; transfuse if Hb <7    # DKA  - per pmd     Myer Fret MD  [facility] 05/23/2024, 9:43 AM  Recent Labs  Lab 05/20/24 2038 05/20/24 2039 05/21/24 2233 05/22/24 0550 05/23/24 0500  HGB 8.2*  --   --   --  8.3*  ALBUMIN   --   --   --  3.2*  --   CALCIUM   --    < > 7.3*  7.7*  --   PHOS  --   --   --  3.9  --   CREATININE  --    < > 3.98* 4.30*  --   K  --    < > 3.9 3.9  --    < > = values in this interval not displayed.   No results for input(s): IRON , TIBC, FERRITIN in the last 168 hours. Inpatient medications:  carvedilol   25 mg Oral BID WC   Chlorhexidine  Gluconate Cloth  6 each Topical Q0600   Chlorhexidine  Gluconate Cloth  6 each Topical Q0600   dextrose   25 g Intravenous Once   diclofenac  Sodium  2 g Topical QID   DULoxetine   20 mg Oral Daily   heparin   1,500 Units Dialysis Once in dialysis   heparin   2,000 Units Dialysis Once in dialysis   heparin   5,000 Units Subcutaneous Q8H   insulin  aspart  0-5 Units Subcutaneous QHS   insulin   aspart  0-6 Units Subcutaneous TID WC   insulin  glargine-yfgn  5 Units Subcutaneous Q2200   lacosamide   100 mg Oral BID   sevelamer  carbonate  800 mg Oral TID WC    anticoagulant sodium citrate       alteplase , anticoagulant sodium citrate , cyclobenzaprine , dextrose , [START ON 05/24/2024] heparin , heparin , [START ON 05/24/2024] heparin , hydrOXYzine , lidocaine  (PF), lidocaine -prilocaine , oxyCODONE -acetaminophen , pentafluoroprop-tetrafluoroeth

## 2024-05-23 NOTE — Plan of Care (Signed)
" °  Problem: Education: Goal: Ability to describe self-care measures that may prevent or decrease complications (Diabetes Survival Skills Education) will improve Outcome: Progressing   Problem: Coping: Goal: Ability to adjust to condition or change in health will improve Outcome: Progressing   Problem: Education: Goal: Ability to describe self-care measures that may prevent or decrease complications (Diabetes Survival Skills Education) will improve Outcome: Progressing   Problem: Fluid Volume: Goal: Ability to achieve a balanced intake and output will improve Outcome: Progressing   "

## 2024-05-23 NOTE — Discharge Summary (Addendum)
 Discharge summary       Physician Discharge Summary  Ashley Freeman FMW:[id number] DOB: [birth year] DOA: 05/20/2024  PCP: Keven Crumbly Pap, MD  Admit date: 05/20/2024 Discharge date: 05/23/2024  Admitted From: (Home) Disposition:  (Home)  Recommendations for Outpatient Follow-up:  Follow up with PCP in 1-2 weeks Please obtain BMP/CBC in one week    Diet recommendation: Renal diet/ Carb Modified   Brief/Interim Summary: Ashley Freeman was admitted to the hospital with the working diagnosis of DKA.   34 yo female with the past medical history of type 1 DM, ESRD on HD, and bipolar disorder.  Multiple hospitalizations for DKA, last one 01/13 to 05/18/24 for uncontrolled diabetes hypo and hyperglycemia.  She had outpatient HD after her discharge. At home she noted severe generalized edema, prompting her to call EMS. She was found edematous and was transported to the ED.  On her initial physical examination her blood pressure was 145/86, HR 83, RR 20, 02 saturation 100% Lungs with coarse breath sounds and diffuse rales, heart with S2 and S2 present and regular, with no rubs or murmurs, abdomen with no distention and positive lower extremity edema ++.   patient with multiple hospitalizations,this is her 18th admission over 6 months of this admission is due to her poor compliance either from not taking her insulin  or missing her dialysis.    Diabetic ketoacidosis associated with type 1 diabetes mellitus (HCC) -Multiple admissions in the past for DKA - Initially on insulin  drip, anion gap has closed, transition to subcu insulin , she had some hypoglycemia 2 days ago, this has resolved, currently her CBG are controlled on current dose of Levemir .    Acute on chronic diastolic heart failure (HCC) 08/2023 echocardiogram with reduced LV systolic function 30 to 35%, global hypokinesis, gradre II diastolic dysfunction with pseudo normalization, RV systolic function  preserved, LA and RA with mild dilatation, severe tricuspid regurgitation.   -Evidence of volume overload on imaging, physical exam, the input greatly appreciated, she was dialyzed in the hospital with significant amount of fluid removal she is on room air on discharge  ( she above 12 kg over her weight this admission)   ESRD (end stage renal disease) (HCC) Hyponatremia, hypokalemia  - Management per renal, please see above discussion   Seizure disorder (HCC) No active seizures Continue with dimpat    Essential hypertension Continue blood pressure monitoring  Resume carvedilol     Dyslipidemia Continue statin    Bipolar disorder (HCC) Resume as needed hydroxyzine   Chronic pain, resume duloxetine  and as needed oxycodone , dc morphine   Follow up as outpatient    Anemia of chronic kidney disease -IV iron  and iron  as per renal       Discharge Diagnoses:  Active Problems:   Diabetic ketoacidosis associated with type 1 diabetes mellitus (HCC)   Acute on chronic diastolic heart failure (HCC)   ESRD (end stage renal disease) (HCC)   Seizure disorder (HCC)   Essential hypertension   Dyslipidemia   Bipolar disorder [facility])    Discharge Instructions  Discharge Instructions     Increase activity slowly   Complete by: As directed    No wound care   Complete by: As directed       Allergies as of 05/23/2024       Reactions   Keflex  [cephalexin ] Anaphylaxis   Ceftriaxone  in the past with no reaction   Penicillins Anaphylaxis, Hives, Rash   Vibramycin  [doxycycline ] Anaphylaxis   Benadryl  [diphenhydramine ] Itching   Entresto  [  sacubitril -valsartan ] Swelling   angioedema   Dilaudid  [hydromorphone ] Itching   Losartan  Dermatitis   Methotrexate And Trimetrexate Rash   Roxicodone  [oxycodone ] Itching   Takes Percocet without issue        Medication List     TAKE these medications    albuterol  108 (90 Base) MCG/ACT inhaler Commonly known as: VENTOLIN  HFA Inhale 2 puffs  into the lungs every 4 (four) hours as needed for wheezing or shortness of breath.   bumetanide  2 MG tablet Commonly known as: BUMEX  Take 10 mg by mouth daily.   carvedilol  25 MG tablet Commonly known as: COREG  Take 1 tablet (25 mg total) by mouth 2 (two) times daily with a meal. Follow with your PCP for refills.   Cinnamon 500 MG capsule Take 500 mg by mouth in the morning.   DULoxetine  20 MG capsule Commonly known as: CYMBALTA  Take 1 capsule (20 mg total) by mouth daily.   ELDERBERRY IMMUNE HEALTH GUMMY PO Take 1 tablet by mouth daily.   fluticasone  50 MCG/ACT nasal spray Commonly known as: FLONASE  Place 2 sprays into both nostrils daily as needed for allergies or rhinitis.   hydrOXYzine  25 MG tablet Commonly known as: ATARAX  Take 1 tablet (25 mg total) by mouth 3 (three) times daily as needed for anxiety, itching, nausea or vomiting.   insulin  aspart 100 UNIT/ML FlexPen Commonly known as: NOVOLOG  Inject 2 Units into the skin 3 (three) times daily with meals.   lacosamide  50 MG Tabs tablet Commonly known as: VIMPAT  Take 2 tablets (100 mg total) by mouth 2 (two) times daily. Take additional 1 tablet (50 mg) after dialysis on Tuesdays, Thursdays and Saturdays   Lantus  SoloStar 100 UNIT/ML Solostar Pen Generic drug: insulin  glargine Inject 5 Units into the skin daily.   methocarbamol  500 MG tablet Commonly known as: ROBAXIN  Take 1 tablet (500 mg total) by mouth 4 (four) times daily.   OLANZapine  5 MG tablet Commonly known as: ZYPREXA  Take 1 tablet (5 mg total) by mouth at bedtime.   oxyCODONE -acetaminophen  5-325 MG tablet Commonly known as: PERCOCET/ROXICET Take 1 tablet by mouth every 8 (eight) hours as needed for moderate pain (pain score 4-6).   Pancrelipase  (Lip-Prot-Amyl) 3000-9500 units Cpep Take 3,000 Units by mouth in the morning, at noon, and at bedtime.   prochlorperazine  5 MG tablet Commonly known as: COMPAZINE  Take 5 mg by mouth every 6 (six) hours  as needed for nausea or vomiting.   sevelamer  carbonate 800 MG tablet Commonly known as: RENVELA  Take 1 tablet (800 mg total) by mouth 3 (three) times daily with meals.   SUMAtriptan  50 MG tablet Commonly known as: IMITREX  Take 50 mg by mouth every 2 (two) hours as needed for migraine or headache.   Valtoco  15 MG Dose 2 x 7.5 MG/0.1ML Lqpk Generic drug: diazePAM  (15 MG Dose) Place 15 mg into the nose as needed (sz lasting over 2 minutes).   Veltassa  16.8 g Pack Generic drug: Patiromer  Sorbitex Calcium  Take 1 packet by mouth daily at 12 noon.        Allergies[1]  Consultations: renal   Procedures/Studies: CT ABDOMEN PELVIS WO CONTRAST Result Date: 05/20/2024 EXAM: CT ABDOMEN AND PELVIS WITHOUT CONTRAST 05/20/2024 05:21:43 PM TECHNIQUE: CT of the abdomen and pelvis was performed without the administration of intravenous contrast. Multiplanar reformatted images are provided for review. Automated exposure control, iterative reconstruction, and/or weight-based adjustment of the mA/kV was utilized to reduce the radiation dose to as low as reasonably achievable. COMPARISON: 03/08/2024  CLINICAL HISTORY: Abdominal pain, acute, nonlocalized; ab pain r/o SBO. Patient has hx of ESRD on HD. FINDINGS: LOWER CHEST: Small right and trace left pleural effusions. Patchy ground glass opacities in the lung bases, possibly atelectasis or developing edema. LIVER: The liver is unremarkable. GALLBLADDER AND BILE DUCTS: Cholecystectomy. No biliary ductal dilatation. SPLEEN: No acute abnormality. PANCREAS: No acute abnormality. ADRENAL GLANDS: No acute abnormality. KIDNEYS, URETERS AND BLADDER: Renal cortical atrophy bilaterally, consistent with chronic medical renal disease. No stones in the kidneys or ureters. No hydronephrosis. No perinephric or periureteral stranding. Plate decompression of the urinary bladder. GI AND BOWEL: Stomach demonstrates no acute abnormality. Gas filled nondistended appendix. There  is no bowel obstruction. PERITONEUM AND RETROPERITONEUM: Large volume ascites. No free air. VASCULATURE: Aorta is normal in caliber. Diffuse [place] calcification throughout the mesenteric vessels. LYMPH NODES: No lymphadenopathy. REPRODUCTIVE ORGANS: The uterus and ovaries are within normal limits for patients age. BONES AND SOFT TISSUES: No acute osseous abnormality. Severe, diffuse anasarca. IMPRESSION: 1. No acute intraabdominal or pelvic abnormality. 2. Severe, diffuse anasarca and large volume ascites with small right and trace left pleural effusions, likely related to the patient's volume status. 3. Patchy ground glass opacities in the lung bases, possibly representing atelectasis or developing pulmonary edema. Electronically signed by: Rogelia Myers MD 05/20/2024 05:33 PM EST RP Workstation: [id number]   DG Chest Port 1 View Result Date: 05/20/2024 EXAM: 1 VIEW(S) XRAY OF THE CHEST 05/20/2024 05:02:04 PM COMPARISON: 05/12/2024 CLINICAL HISTORY: SOB SOB SOB SOB SOB FINDINGS: LINES, TUBES AND DEVICES: Cardiac leads in place. LUNGS AND PLEURA: Underinflation. No focal pulmonary opacity. No pleural effusion. No pneumothorax. HEART AND MEDIASTINUM: Cardiomegaly. BONES AND SOFT TISSUES: No acute osseous abnormality. IMPRESSION: 1. Cardiomegaly. 2. No acute findings. Electronically signed by: Greig Pique MD 05/20/2024 05:10 PM EST RP Workstation: [id number]   DG Chest Port 1 View Result Date: 05/12/2024 EXAM: 1 VIEW(S) XRAY OF THE CHEST 05/12/2024 12:16:48 AM COMPARISON: 05/06/2024 CLINICAL HISTORY: Hyperglycemia FINDINGS: LUNGS AND PLEURA: Low lung volumes. No focal pulmonary opacity. No pleural effusion. No pneumothorax. HEART AND MEDIASTINUM: Cardiomegaly. BONES AND SOFT TISSUES: No acute osseous abnormality. IMPRESSION: 1. Cardiomegaly. Electronically signed by: Oneil Devonshire MD MD 05/12/2024 12:25 AM EST RP Workstation: [id number] View Result Date: 05/07/2024 EXAM: 1 VIEW XRAY OF THE  ABDOMEN 05/07/2024 05:44:00 PM COMPARISON: 05/06/2024 CLINICAL HISTORY: Ileus (HCC) FINDINGS: BOWEL: Gas filled loops of nondilated small bowel with scattered gas in the cecum and transverse colon. This appearance is nonobstructive but raises the possibility adynamic small bowel ileus or enteritis. SOFT TISSUES: Cholecystectomy clips present. No abnormal calcifications. BONES: No acute fracture. IMPRESSION: 1. Possible adynamic ileus or enteritis. Electronically signed by: Pinkie Pebbles MD MD 05/07/2024 07:31 PM EST RP Workstation: [id number]   CT HEAD WO CONTRAST ( ) Result Date: 05/07/2024 EXAM: CT HEAD WITHOUT CONTRAST 05/07/2024 07:24:03 PM TECHNIQUE: CT of the head was performed without the administration of intravenous contrast. Automated exposure control, iterative reconstruction, and/or weight based adjustment of the mA/kV was utilized to reduce the radiation dose to as low as reasonably achievable. COMPARISON: 04/13/2024 CLINICAL HISTORY: Mental status change, unknown cause. FINDINGS: BRAIN AND VENTRICLES: No acute hemorrhage. No evidence of acute infarct. No hydrocephalus. No extra-axial collection. No mass effect or midline shift. Intracranial atherosclerosis. ORBITS: No acute abnormality. SINUSES: No acute abnormality. SOFT TISSUES AND SKULL: No acute soft tissue abnormality. No skull fracture. IMPRESSION: 1. No acute intracranial abnormality. Electronically signed by: Pinkie Pebbles MD MD 05/07/2024 07:27 PM EST  RP Workstation: [id number]   DG Abd 1 View Result Date: 05/06/2024 CLINICAL DATA:  Abdominal pain EXAM: ABDOMEN - 1 VIEW COMPARISON:  04/23/2024 FINDINGS: Mild gaseous distention of the stomach. No gaseous small bowel dilatation to suggest obstruction. Gas is visible in a nondilated splenic flexure of the colon. Surgical clips in the right upper quadrant suggest prior cholecystectomy. IMPRESSION: Mild gaseous distention of the stomach. No gaseous small bowel dilatation to suggest  obstruction. Electronically Signed   By: Camellia Candle M.D.   On: 05/06/2024 06:53   DG Chest Port 1 View Result Date: 05/06/2024 CLINICAL DATA:  Cough EXAM: PORTABLE CHEST 1 VIEW COMPARISON:  04/30/2024, 04/24/2024 FINDINGS: Cardiomegaly. No focal airspace disease, pleural effusion or pneumothorax. Decreased vascular congestion and interstitial edema compared to prior. IMPRESSION: Cardiomegaly with decreased vascular congestion and interstitial edema compared to prior. Electronically Signed   By: Luke Bun M.D.   On: 05/06/2024 00:18   DG Chest Port 1 View Result Date: 04/30/2024 EXAM: 1 VIEW(S) XRAY OF THE CHEST 04/30/2024 08:32:51 PM COMPARISON: 04/24/2024 CLINICAL HISTORY: SOB FINDINGS: LUNGS AND PLEURA: Low lung volumes. Perihilar vascular fullness with bilateral interstitial prominence. Decreased retrocardiac opacity. Trace right pleural effusion. No focal pulmonary opacity. No pneumothorax. HEART AND MEDIASTINUM: Unchanged cardiomegaly. BONES AND SOFT TISSUES: No acute osseous abnormality. IMPRESSION: 1. Mild pulmonary vascular congestion and interstitial edema. 2. Trace right pleural effusion. 3. Unchanged cardiomegaly. Electronically signed by: Greig Pique MD 04/30/2024 08:38 PM EST RP Workstation: [id number]   DG CHEST PORT 1 VIEW Result Date: 04/24/2024 CLINICAL DATA:  Pleural effusion. EXAM: PORTABLE CHEST 1 VIEW COMPARISON:  Radiograph yesterday FINDINGS: The endotracheal tube tip is 2.8 cm from the carina. Tip and side port of the enteric tube below the diaphragm in the stomach. Lung volumes are low. Small bilateral pleural effusions. On the right this is likely more layering than on prior exam. Persistent right perihilar opacity, although improved from prior. Central vascular congestion. No pneumothorax. IMPRESSION: 1. Small bilateral pleural effusions. On the right this is likely more layering than on prior exam. 2. Persistent right perihilar opacity, although improved from prior. 3.  Central vascular congestion. Electronically Signed   By: Andrea Gasman M.D.   On: 04/24/2024 14:13   Overnight EEG with video Result Date: 04/24/2024 Shelton Arlin KIDD, MD     04/25/2024  7:55 AM Patient Name: RASHAUNDA RAHL MRN: [id number] Epilepsy Attending: Arlin KIDD Shelton Referring Physician/Provider: everitt Clint Abbey Earle FORBES, NP Duration: 04/23/2024 1644 to 04/24/2024 1644 Patient history:  34 y.o. female who originally presented with generalized pain. She was noted to have an extremely high blood glucose and was found to be in DKA.  She then had seizure activity in the emergency department. EEG to evaluate for seizure Level of alertness:comatose AEDs during EEG study: LCM, Propofol  Technical aspects: This EEG study was done with scalp electrodes positioned according to the 10-20 International system of electrode placement. Electrical activity was reviewed with band pass filter of 1-70Hz , sensitivity of 7 uV/mm, display speed of 63mm/sec with a 60Hz  notched filter applied as appropriate. EEG data were recorded continuously and digitally stored.  Video monitoring was available and reviewed as appropriate. Description: EEG initially showed burst suppression with bursts of generalized 5 to 6 Hz theta slowing lasting 1-2 seconds admixed with 3-7 seconds of generalized suppression. Gradually EEG evolved into near continuous generalized 3-6hz  theta-delta slowing. Hyperventilation and photic stimulation were not performed.   EKG artifact was seen during the study ABNORMALITY - Burst  suppression, generalized - Continuous slow, generalized IMPRESSION: This study is suggestive of severe to profound diffuse encephalopathy, likely related to sedation. No seizures or epileptiform discharges were seen throughout the recording. Arlin MALVA Krebs   DG Abd Portable 1V Result Date: 04/23/2024 CLINICAL DATA:  Nasogastric tube placement. EXAM: PORTABLE ABDOMEN - 1 VIEW COMPARISON:  None Available. FINDINGS:  Nasogastric tube terminates in the stomach with the side port a few cm beyond the gastroesophageal junction. Surgical clips in the right upper quadrant. Abdomen is otherwise grossly unremarkable. IMPRESSION: Nasogastric tube terminates in the stomach. Chest radiograph dictated separately. Electronically Signed   By: Newell Eke M.D.   On: 04/23/2024 16:38   DG CHEST PORT 1 VIEW Result Date: 04/23/2024 CLINICAL DATA:  Nasogastric tube placement. EXAM: PORTABLE CHEST 1 VIEW COMPARISON:  04/23/2024 and CT chest 04/16/2024. FINDINGS: Endotracheal to terminates approximately 2.5 cm above the carina. Nasogastric tube is followed into the stomach with the side port likely a few cm beyond the gastroesophageal junction. Tip extends beyond the inferior margin of the image. Patient is rotated. Heart is enlarged. New right upper and right lower lobe collapse/consolidation with left perihilar and left lower lobe volume loss. Moderate right pleural effusion. No pneumothorax. IMPRESSION: 1. Satisfactory endotracheal tube placement. 2. New collapse/consolidation in the right upper and right lower lobes, possibly due to mucous plugging. 3. Moderate right pleural effusion. 4. Left perihilar and left lower lobe atelectasis. Electronically Signed   By: Newell Eke M.D.   On: 04/23/2024 16:37   (Echo, Carotid, EGD, Colonoscopy, ERCP)    Subjective:  No significant events overnight  Discharge Exam: Vitals:   05/23/24 1000 05/23/24 1030  BP: 119/72 (!) 140/81  Pulse: 81 86  Resp: 15 15  Temp:    SpO2: 99% 96%   Vitals:   05/23/24 0930 05/23/24 0935 05/23/24 1000 05/23/24 1030  BP: (!) 164/132 129/62 119/72 (!) 140/81  Pulse: 77 77 81 86  Resp: 19 16 15 15   Temp:      TempSrc:      SpO2: 99% 99% 99% 96%  Weight:      Height:        General: Pt not in acute distress Respiratory: no wheezing, no rhonchi Extremities: +1edema, no cyanosis    The results of significant diagnostics from this  hospitalization (including imaging, microbiology, ancillary and laboratory) are listed below for reference.     Microbiology: No results found for this or any previous visit (from the past 240 hours).   Labs: BNP (last 3 results) Recent Labs    01/04/24 0424 01/16/24 0608 03/21/24 2153  BNP >4,500.0* 4,044.0* >4,500.0*   Basic Metabolic Panel: Recent Labs  Lab 05/21/24 0645 05/21/24 0852 05/21/24 2233 05/22/24 0550 05/23/24 0500  NA 139 140 137 136 139  K 3.8 4.0 3.9 3.9 4.6  CL 102 101 97* 98 103  CO2 18* 22 27 25 25   GLUCOSE 72 83 206* 186* 99  BUN 46* 47* 23* 24* 28*  CREATININE 6.54* 6.60* 3.98* 4.30* 5.11*  CALCIUM  7.2* 7.4* 7.3* 7.7* 8.0*  PHOS  --   --   --  3.9 5.6*   Liver Function Tests: Recent Labs  Lab 05/22/24 0550 05/23/24 0500  ALBUMIN  3.2* 3.2*   No results for input(s): LIPASE, AMYLASE in the last 168 hours. No results for input(s): AMMONIA in the last 168 hours. CBC: Recent Labs  Lab 05/20/24 1632 05/20/24 1707 05/20/24 1719 05/20/24 2038 05/23/24 0500  WBC 10.4  --   --  11.3* 9.8  NEUTROABS 5.4  --   --   --   --   HGB 8.4* 10.2* 9.9* 8.2* 8.3*  HCT 29.1* 30.0* 29.0* 27.4* 26.6*  MCV 92.4  --   --  88.1 85.3  PLT 300  --   --  305 239   Cardiac Enzymes: No results for input(s): CKTOTAL, CKMB, CKMBINDEX, TROPONINI in the last 168 hours. BNP: Invalid input(s): POCBNP CBG: Recent Labs  Lab 05/22/24 1603 05/22/24 2041 05/23/24 0016 05/23/24 0435 05/23/24 0733  GLUCAP 131* 183* 118* 84 81   D-Dimer No results for input(s): DDIMER in the last 72 hours. Hgb A1c No results for input(s): HGBA1C in the last 72 hours. Lipid Profile No results for input(s): CHOL, HDL, LDLCALC, TRIG, CHOLHDL, LDLDIRECT in the last 72 hours. Thyroid  function studies No results for input(s): TSH, T4TOTAL, T3FREE, THYROIDAB in the last 72 hours.  Invalid input(s): FREET3 Anemia work up No results for  input(s): VITAMINB12, FOLATE, FERRITIN, TIBC, IRON , RETICCTPCT in the last 72 hours. Urinalysis    Component Value Date/Time   COLORURINE YELLOW 04/13/2024 1749   APPEARANCEUR HAZY (A) 04/13/2024 1749   APPEARANCEUR Hazy 11/10/2013 2043   LABSPEC 1.018 04/13/2024 1749   LABSPEC 1.031 11/10/2013 2043   PHURINE 7.0 04/13/2024 1749   GLUCOSEU >=500 (A) 04/13/2024 1749   GLUCOSEU >=500 11/10/2013 2043   HGBUR NEGATIVE 04/13/2024 1749   BILIRUBINUR NEGATIVE 04/13/2024 1749   BILIRUBINUR negative 06/04/2018 1035   BILIRUBINUR Negative 11/24/2015 1443   BILIRUBINUR Negative 11/10/2013 2043   KETONESUR 5 (A) 04/13/2024 1749   PROTEINUR >=300 (A) 04/13/2024 1749   UROBILINOGEN 0.2 09/14/2019 1732   NITRITE NEGATIVE 04/13/2024 1749   LEUKOCYTESUR NEGATIVE 04/13/2024 1749   LEUKOCYTESUR 1+ 11/10/2013 2043   Sepsis Labs Recent Labs  Lab 05/20/24 1632 05/20/24 2038 05/23/24 0500  WBC 10.4 11.3* 9.8   Microbiology No results found for this or any previous visit (from the past 240 hours).   Time coordinating discharge: Over 30 minutes  SIGNED:   Brayton Lye, MD  [facility] 05/23/2024, 10:58 AM Pager   If 7PM-7AM, please contact night-coverage [email or web address]      [1]  Allergies Allergen Reactions   Keflex  [Cephalexin ] Anaphylaxis    Ceftriaxone  in the past with no reaction   Penicillins Anaphylaxis, Hives and Rash   Vibramycin  [Doxycycline ] Anaphylaxis   Benadryl  [Diphenhydramine ] Itching   Entresto  [Sacubitril -Valsartan ] Swelling    angioedema   Dilaudid  [Hydromorphone ] Itching   Losartan  Dermatitis   Methotrexate And Trimetrexate Rash   Roxicodone  [Oxycodone ] Itching    Takes Percocet without issue

## 2024-05-23 NOTE — Progress Notes (Signed)
 Received patient in bed to unit.  Alert and oriented.  Informed consent signed and in chart.   TX duration:3.5 hours  Patient tolerated well.  Transported back to the room  Alert, without acute distress.  Hand-off given to patient's nurse.   Access used: Right Upper arm graft Access issues: none  Total UF removed: 5L   05/23/24 1254  Vitals  Temp 98.2 F (36.8 C)  Temp Source Oral  BP (!) 160/110  MAP (mmHg) 120  Pulse Rate 80  ECG Heart Rate 80  Resp 12  Weight 64.2 kg  Type of Weight Post-Dialysis  Oxygen Therapy  SpO2 100 %  O2 Device Room Air  During Treatment Monitoring  Duration of HD Treatment -hour(s) 3.5 hour(s)  HD Safety Checks Performed Yes  Intra-Hemodialysis Comments Tx completed  Post Treatment  Dialyzer Clearance Lightly streaked  Liters Processed 84  Fluid Removed (mL) 5000 mL  Tolerated HD Treatment Yes  AVG/AVF Arterial Site Held (minutes) 7 minutes  AVG/AVF Venous Site Held (minutes) 7 minutes  Fistula / Graft Right Upper arm Arteriovenous vein graft  Placement Date/Time: 07/06/22 1013   Placed prior to admission: No  Orientation: Right  Access Location: Upper arm  Access Type: Arteriovenous vein graft  Site Condition No complications  Fistula / Graft Assessment Present;Thrill;Bruit  Status Patent;Deaccessed  Drainage Description None     Camellia Brasil LPN [facility]

## 2024-05-24 ENCOUNTER — Inpatient Hospital Stay (HOSPITAL_COMMUNITY)
Admission: EM | Admit: 2024-05-24 | Discharge: 2024-05-27 | DRG: 637 | Discharge status: Against Medical Advice | Attending: Internal Medicine | Admitting: Internal Medicine

## 2024-05-24 ENCOUNTER — Encounter (HOSPITAL_COMMUNITY): Payer: Self-pay

## 2024-05-24 ENCOUNTER — Emergency Department (HOSPITAL_COMMUNITY)

## 2024-05-24 ENCOUNTER — Other Ambulatory Visit: Payer: Self-pay

## 2024-05-24 DIAGNOSIS — Z862 Personal history of diseases of the blood and blood-forming organs and certain disorders involving the immune mechanism: Secondary | ICD-10-CM

## 2024-05-24 DIAGNOSIS — E109 Type 1 diabetes mellitus without complications: Secondary | ICD-10-CM | POA: Diagnosis present

## 2024-05-24 DIAGNOSIS — Z8659 Personal history of other mental and behavioral disorders: Secondary | ICD-10-CM | POA: Diagnosis not present

## 2024-05-24 DIAGNOSIS — R109 Unspecified abdominal pain: Secondary | ICD-10-CM

## 2024-05-24 DIAGNOSIS — I071 Rheumatic tricuspid insufficiency: Secondary | ICD-10-CM | POA: Diagnosis not present

## 2024-05-24 DIAGNOSIS — Z91158 Patient's noncompliance with renal dialysis for other reason: Secondary | ICD-10-CM

## 2024-05-24 DIAGNOSIS — Z5982 Transportation insecurity: Secondary | ICD-10-CM

## 2024-05-24 DIAGNOSIS — E10649 Type 1 diabetes mellitus with hypoglycemia without coma: Secondary | ICD-10-CM | POA: Diagnosis not present

## 2024-05-24 DIAGNOSIS — I428 Other cardiomyopathies: Secondary | ICD-10-CM | POA: Diagnosis present

## 2024-05-24 DIAGNOSIS — E1022 Type 1 diabetes mellitus with diabetic chronic kidney disease: Secondary | ICD-10-CM | POA: Diagnosis present

## 2024-05-24 DIAGNOSIS — I132 Hypertensive heart and chronic kidney disease with heart failure and with stage 5 chronic kidney disease, or end stage renal disease: Secondary | ICD-10-CM | POA: Diagnosis present

## 2024-05-24 DIAGNOSIS — G40909 Epilepsy, unspecified, not intractable, without status epilepticus: Secondary | ICD-10-CM | POA: Diagnosis present

## 2024-05-24 DIAGNOSIS — F319 Bipolar disorder, unspecified: Secondary | ICD-10-CM | POA: Diagnosis present

## 2024-05-24 DIAGNOSIS — Z833 Family history of diabetes mellitus: Secondary | ICD-10-CM

## 2024-05-24 DIAGNOSIS — Z87898 Personal history of other specified conditions: Secondary | ICD-10-CM | POA: Diagnosis not present

## 2024-05-24 DIAGNOSIS — R7401 Elevation of levels of liver transaminase levels: Secondary | ICD-10-CM | POA: Diagnosis not present

## 2024-05-24 DIAGNOSIS — I5082 Biventricular heart failure: Secondary | ICD-10-CM | POA: Diagnosis present

## 2024-05-24 DIAGNOSIS — G8929 Other chronic pain: Secondary | ICD-10-CM | POA: Diagnosis present

## 2024-05-24 DIAGNOSIS — Z992 Dependence on renal dialysis: Secondary | ICD-10-CM

## 2024-05-24 DIAGNOSIS — Z825 Family history of asthma and other chronic lower respiratory diseases: Secondary | ICD-10-CM

## 2024-05-24 DIAGNOSIS — R739 Hyperglycemia, unspecified: Secondary | ICD-10-CM | POA: Diagnosis not present

## 2024-05-24 DIAGNOSIS — N186 End stage renal disease: Secondary | ICD-10-CM | POA: Diagnosis present

## 2024-05-24 DIAGNOSIS — N189 Chronic kidney disease, unspecified: Secondary | ICD-10-CM | POA: Diagnosis not present

## 2024-05-24 DIAGNOSIS — Z8679 Personal history of other diseases of the circulatory system: Secondary | ICD-10-CM

## 2024-05-24 DIAGNOSIS — I5042 Chronic combined systolic (congestive) and diastolic (congestive) heart failure: Secondary | ICD-10-CM | POA: Diagnosis present

## 2024-05-24 DIAGNOSIS — Z794 Long term (current) use of insulin: Secondary | ICD-10-CM

## 2024-05-24 DIAGNOSIS — R1011 Right upper quadrant pain: Principal | ICD-10-CM

## 2024-05-24 DIAGNOSIS — Z5329 Procedure and treatment not carried out because of patient's decision for other reasons: Secondary | ICD-10-CM | POA: Diagnosis present

## 2024-05-24 DIAGNOSIS — Z765 Malingerer [conscious simulation]: Secondary | ICD-10-CM

## 2024-05-24 DIAGNOSIS — D631 Anemia in chronic kidney disease: Secondary | ICD-10-CM | POA: Diagnosis present

## 2024-05-24 DIAGNOSIS — R188 Other ascites: Secondary | ICD-10-CM | POA: Diagnosis present

## 2024-05-24 DIAGNOSIS — E1010 Type 1 diabetes mellitus with ketoacidosis without coma: Principal | ICD-10-CM | POA: Diagnosis present

## 2024-05-24 DIAGNOSIS — E1110 Type 2 diabetes mellitus with ketoacidosis without coma: Secondary | ICD-10-CM | POA: Diagnosis present

## 2024-05-24 DIAGNOSIS — E785 Hyperlipidemia, unspecified: Secondary | ICD-10-CM | POA: Diagnosis present

## 2024-05-24 DIAGNOSIS — Z79899 Other long term (current) drug therapy: Secondary | ICD-10-CM

## 2024-05-24 HISTORY — DX: Unspecified convulsions: R56.9

## 2024-05-24 LAB — PRO BRAIN NATRIURETIC PEPTIDE: Pro Brain Natriuretic Peptide: >35000 pg/mL — ABNORMAL HIGH

## 2024-05-24 LAB — I-STAT VENOUS BLOOD GAS, ED
Acid-Base Excess: 1 mmol/L (ref 0.0–2.0)
Bicarbonate: 23.4 mmol/L (ref 20.0–28.0)
Calcium, Ion: 0.88 mmol/L — CL (ref 1.15–1.40)
HCT: 32 % — ABNORMAL LOW (ref 36.0–46.0)
Hemoglobin: 10.9 g/dL — ABNORMAL LOW (ref 12.0–15.0)
O2 Saturation: 96 %
Potassium: 4.9 mmol/L (ref 3.5–5.1)
Sodium: 132 mmol/L — ABNORMAL LOW (ref 135–145)
TCO2: 24 mmol/L (ref 22–32)
pCO2, Ven: 29.4 mmHg — ABNORMAL LOW (ref 44–60)
pH, Ven: 7.51 — ABNORMAL HIGH (ref 7.25–7.43)
pO2, Ven: 73 mmHg — ABNORMAL HIGH (ref 32–45)

## 2024-05-24 LAB — CBC WITH DIFFERENTIAL/PLATELET
Abs Immature Granulocytes: 0.04 10*3/uL (ref 0.00–0.07)
Basophils Absolute: 0.1 10*3/uL (ref 0.0–0.1)
Basophils Relative: 1 %
Eosinophils Absolute: 1.5 10*3/uL — ABNORMAL HIGH (ref 0.0–0.5)
Eosinophils Relative: 16 %
HCT: 33.9 % — ABNORMAL LOW (ref 36.0–46.0)
Hemoglobin: 9.9 g/dL — ABNORMAL LOW (ref 12.0–15.0)
Immature Granulocytes: 0 %
Lymphocytes Relative: 36 %
Lymphs Abs: 3.4 10*3/uL (ref 0.7–4.0)
MCH: 26.7 pg (ref 26.0–34.0)
MCHC: 29.2 g/dL — ABNORMAL LOW (ref 30.0–36.0)
MCV: 91.4 fL (ref 80.0–100.0)
Monocytes Absolute: 0.7 10*3/uL (ref 0.1–1.0)
Monocytes Relative: 7 %
Neutro Abs: 3.9 10*3/uL (ref 1.7–7.7)
Neutrophils Relative %: 40 %
Platelets: 230 10*3/uL (ref 150–400)
RBC: 3.71 MIL/uL — ABNORMAL LOW (ref 3.87–5.11)
RDW: 16 % — ABNORMAL HIGH (ref 11.5–15.5)
WBC: 9.6 10*3/uL (ref 4.0–10.5)
nRBC: 0 % (ref 0.0–0.2)

## 2024-05-24 LAB — COMPREHENSIVE METABOLIC PANEL WITH GFR
ALT: 63 U/L — ABNORMAL HIGH (ref 0–44)
AST: 133 U/L — ABNORMAL HIGH (ref 15–41)
Albumin: 3.9 g/dL (ref 3.5–5.0)
Alkaline Phosphatase: 568 U/L — ABNORMAL HIGH (ref 38–126)
Anion gap: 16 — ABNORMAL HIGH (ref 5–15)
BUN: 23 mg/dL — ABNORMAL HIGH (ref 6–20)
CO2: 23 mmol/L (ref 22–32)
Calcium: 8.2 mg/dL — ABNORMAL LOW (ref 8.9–10.3)
Chloride: 94 mmol/L — ABNORMAL LOW (ref 98–111)
Creatinine, Ser: 4.5 mg/dL — ABNORMAL HIGH (ref 0.44–1.00)
GFR, Estimated: 13 mL/min — ABNORMAL LOW
Glucose, Bld: 822 mg/dL (ref 70–99)
Potassium: 5.3 mmol/L — ABNORMAL HIGH (ref 3.5–5.1)
Sodium: 133 mmol/L — ABNORMAL LOW (ref 135–145)
Total Bilirubin: 0.5 mg/dL (ref 0.0–1.2)
Total Protein: 6 g/dL — ABNORMAL LOW (ref 6.5–8.1)

## 2024-05-24 LAB — PROCALCITONIN: Procalcitonin: 0.66 ng/mL

## 2024-05-24 LAB — CBG MONITORING, ED
Glucose-Capillary: >600 mg/dL (ref 70–99)
Glucose-Capillary: >600 mg/dL (ref 70–99)
Glucose-Capillary: 591 mg/dL (ref 70–99)
Glucose-Capillary: >600 mg/dL (ref 70–99)
Glucose-Capillary: 511 mg/dL (ref 70–99)
Glucose-Capillary: 385 mg/dL — ABNORMAL HIGH (ref 70–99)
Glucose-Capillary: 455 mg/dL — ABNORMAL HIGH (ref 70–99)

## 2024-05-24 LAB — LIPASE, BLOOD: Lipase: 15 U/L (ref 11–51)

## 2024-05-24 LAB — BETA-HYDROXYBUTYRIC ACID: Beta-Hydroxybutyric Acid: 0.07 mmol/L (ref 0.05–0.27)

## 2024-05-24 LAB — MAGNESIUM: Magnesium: 2 mg/dL (ref 1.7–2.4)

## 2024-05-24 MED ORDER — PROCHLORPERAZINE EDISYLATE 10 MG/2ML IJ SOLN: 10.0000 mg | Freq: Once | INTRAMUSCULAR | Status: DC

## 2024-05-24 MED ORDER — METOCLOPRAMIDE HCL 5 MG/ML IJ SOLN: 10.0000 mg | Freq: Four times a day (QID) | INTRAMUSCULAR | Status: DC | PRN

## 2024-05-24 MED ORDER — MELATONIN 3 MG PO TABS
3.0000 mg | ORAL_TABLET | Freq: Every evening | ORAL | Status: DC | PRN
  Administered 2024-05-26: 3 mg via ORAL
  Filled 2024-05-24: qty 1

## 2024-05-24 MED ORDER — PROCHLORPERAZINE EDISYLATE 10 MG/2ML IJ SOLN: 10.0000 mg | Freq: Four times a day (QID) | INTRAMUSCULAR | Status: DC | PRN

## 2024-05-24 MED ORDER — OLANZAPINE 5 MG PO TABS
5.0000 mg | ORAL_TABLET | Freq: Every day | ORAL | Status: DC
  Administered 2024-05-24 – 2024-05-26 (×3): 5 mg via ORAL
  Filled 2024-05-24 (×3): qty 1

## 2024-05-24 MED ORDER — FENTANYL CITRATE (PF) 50 MCG/ML IJ SOSY: 100.0000 ug | PREFILLED_SYRINGE | Freq: Once | INTRAMUSCULAR | Status: DC

## 2024-05-24 MED ORDER — LACOSAMIDE 50 MG PO TABS
100.0000 mg | ORAL_TABLET | Freq: Two times a day (BID) | ORAL | Status: DC
  Administered 2024-05-24 – 2024-05-27 (×6): 100 mg via ORAL
  Filled 2024-05-24 (×6): qty 2

## 2024-05-24 MED ORDER — SEVELAMER CARBONATE 800 MG PO TABS
800.0000 mg | ORAL_TABLET | Freq: Three times a day (TID) | ORAL | Status: DC
  Administered 2024-05-25 – 2024-05-27 (×7): 800 mg via ORAL
  Filled 2024-05-24 (×7): qty 1

## 2024-05-24 MED ORDER — LACTATED RINGERS IV SOLN: INTRAVENOUS | Status: DC

## 2024-05-24 MED ORDER — FENTANYL CITRATE (PF) 50 MCG/ML IJ SOSY
50.0000 ug | PREFILLED_SYRINGE | Freq: Once | INTRAMUSCULAR | Status: AC
  Administered 2024-05-24: 50 ug via INTRAVENOUS
  Filled 2024-05-24: qty 1

## 2024-05-24 MED ORDER — HYDROXYZINE HCL 25 MG PO TABS
25.0000 mg | ORAL_TABLET | Freq: Once | ORAL | Status: AC
  Administered 2024-05-24: 25 mg via ORAL
  Filled 2024-05-24: qty 1

## 2024-05-24 MED ORDER — NALOXONE HCL 0.4 MG/ML IJ SOLN: 0.4000 mg | INTRAMUSCULAR | Status: DC | PRN

## 2024-05-24 MED ORDER — DEXTROSE 50 % IV SOLN
0.0000 mL | INTRAVENOUS | Status: DC | PRN
  Filled 2024-05-24: qty 50

## 2024-05-24 MED ORDER — DULOXETINE HCL 20 MG PO CPEP
20.0000 mg | ORAL_CAPSULE | Freq: Every day | ORAL | Status: DC
  Administered 2024-05-25 – 2024-05-27 (×3): 20 mg via ORAL
  Filled 2024-05-24 (×3): qty 1

## 2024-05-24 MED ORDER — ACETAMINOPHEN 650 MG RE SUPP: 650.0000 mg | Freq: Four times a day (QID) | RECTAL | Status: DC | PRN

## 2024-05-24 MED ORDER — FENTANYL CITRATE (PF) 50 MCG/ML IJ SOSY
100.0000 ug | PREFILLED_SYRINGE | Freq: Once | INTRAMUSCULAR | Status: AC
  Administered 2024-05-24: 100 ug via INTRAVENOUS
  Filled 2024-05-24: qty 2

## 2024-05-24 MED ORDER — PANTOPRAZOLE SODIUM 40 MG IV SOLR
40.0000 mg | INTRAVENOUS | Status: DC
  Administered 2024-05-24 – 2024-05-25 (×2): 40 mg via INTRAVENOUS
  Filled 2024-05-24 (×2): qty 10

## 2024-05-24 MED ORDER — DEXTROSE IN LACTATED RINGERS 5 % IV SOLN: INTRAVENOUS | Status: DC

## 2024-05-24 MED ORDER — CARVEDILOL 25 MG PO TABS
25.0000 mg | ORAL_TABLET | Freq: Two times a day (BID) | ORAL | Status: DC
  Administered 2024-05-25 – 2024-05-27 (×5): 25 mg via ORAL
  Filled 2024-05-24: qty 1
  Filled 2024-05-24: qty 2
  Filled 2024-05-24 (×3): qty 1

## 2024-05-24 MED ORDER — FENTANYL CITRATE (PF) 50 MCG/ML IJ SOSY
50.0000 ug | PREFILLED_SYRINGE | INTRAMUSCULAR | Status: DC | PRN
  Administered 2024-05-24 – 2024-05-26 (×11): 50 ug via INTRAVENOUS
  Filled 2024-05-24 (×11): qty 1

## 2024-05-24 MED ORDER — FENTANYL CITRATE (PF) 50 MCG/ML IJ SOSY: 50.0000 ug | PREFILLED_SYRINGE | Freq: Once | INTRAMUSCULAR | Status: DC

## 2024-05-24 MED ORDER — ACETAMINOPHEN 325 MG PO TABS
650.0000 mg | ORAL_TABLET | Freq: Four times a day (QID) | ORAL | Status: DC | PRN
  Administered 2024-05-25 – 2024-05-27 (×2): 650 mg via ORAL
  Filled 2024-05-24 (×2): qty 2

## 2024-05-24 MED ORDER — PROCHLORPERAZINE EDISYLATE 10 MG/2ML IJ SOLN
10.0000 mg | Freq: Once | INTRAMUSCULAR | Status: AC
  Administered 2024-05-24: 10 mg via INTRAVENOUS
  Filled 2024-05-24: qty 2

## 2024-05-24 MED ORDER — INSULIN REGULAR(HUMAN) IN NACL 100-0.9 UT/100ML-% IV SOLN
INTRAVENOUS | Status: DC
  Administered 2024-05-24: 5.5 [IU]/h via INTRAVENOUS
  Filled 2024-05-24: qty 100

## 2024-05-24 MED ORDER — LABETALOL HCL 5 MG/ML IV SOLN
10.0000 mg | INTRAVENOUS | Status: DC | PRN
  Administered 2024-05-25: 10 mg via INTRAVENOUS
  Filled 2024-05-24: qty 4

## 2024-05-24 NOTE — H&P (Signed)
 " History and Physical      Ashley Freeman FMW:[id number] DOB: [birth year] DOA: 05/24/2024; DOS: 05/24/2024  PCP: Keven Crumbly Pap, MD  Patient coming from: home   I have personally briefly reviewed patient's old medical records in [facility]  Chief Complaint: Abdominal pain  HPI: Ashley Freeman is a 34 y.o. female with medical history significant for type 1 diabetes mellitus complicated by recurrent hospitalizations for DKA, chronic abdominal pain, end-stage renal disease on hemodialysis, bipolar disorder, chronic systolic/diastolic heart failure, seizure disorder, severe tricuspid regurgitation, anemia of chronic kidney disease with baseline hemoglobin 8-11, chronic transaminitis, who is admitted to [facility] on 05/24/2024 with DKA after presenting from home to [facility] ED complaining of abdominal pain.   Patient with history of type 1 diabetes mellitus complicated by recurrent DKA with associated recurrent hospitalizations for management thereof, including most recent hospitalization from 05/20/2024 to 05/23/2024.  At the time of discharge from the hospital yesterday, she was transported directly to the dialysis center, where she underwent a full hemodialysis session, including removal of 5 L of fluid.  She has a history of chronic abdominal discomfort, and since the time of discharge from the hospital yesterday, she notes generalized abdominal pain, consistent with the quality of her chronic abdominal discomfort, with potentially slight increase in intensity since time of discharge.  While the pain is generalized in nature, she reports that it is most intense over the epigastrium, right upper quadrant, which is also typical for her areas of highest abdominal pain intensity.  Sharp reports associated nausea in the absence of any vomiting.  Continues to pass stool and flatus, without any recent melena or hematochezia.  Denies any recent diarrhea. She notes  persistent blurry vision, but states that this is not new for her, as she conveys that she was also experiencing blurry vision at time of most recent prior hospitalization, with persistence of this in the interval.  Most recent CT abdomen/pelvis without contrast was performed on 05/20/2024 with similar quality abdominal discomfort at that time, with this imaging showing no evidence of acute intra-abdominal or acute intra-abdominal process.  In the context of this abdominal discomfort, she conveys that she has not taken any of her basal or short acting insulin  since the time of discharge from the hospital yesterday.  Denies any associated chest pain, shortness of breath headache, neck stiffness.  She reports mild nonproductive cough in the absence of any hemoptysis.  her medical history is also notable for severe tricuspid regurgitation as well as chronic systolic/diastolic heart failure in the setting of nonischemic cardiomyopathy.  Most recent echocardiogram was performed on 08/29/2023 and was notable for LVEF 30 to 35%, grade 2 diastolic function, normal right ventricular systolic function, mildly dilated bilateral atria and severe tricuspid regurgitation.  She also has a history of chronic transaminitis with recurrent ascites.  Per chart review, most recent prior liver enzymes were checked on 05/20/2024 and were notable for the following results: Total bilirubin 0.2, alkaline phosphatase 409, with value of 722 on 05/06/2023, AST 67, while also noting value of 190 on 05/05/2024, ALT 37, with value of 133 on 04/23/2024.     ED Course:  Vital signs in the ED were notable for the following: Afebrile; initial heart rates, associated sinus tachycardia, noted to be in the low 100s, septally decreasing in the 90s following initiation of IV fluids; systolic blood pressures in the 150s to 170s; respiratory rate 16, and oxygen saturation 94 to 100% on room air.  Labs were notable for the following: CMP notable for  the following: Sodium 133, which adjusted to approximate 144 when taking into account, hyperglycemia, potassium 5.3, chloride 94, bicarbonate 23, anion gap 16, glucose 822, total bilirubin 0.5, alkaline phosphatase 568, AST 133, ALT 63.  Lipase 15.  CBG greater than 600, VBG demonstrated following: 7.510/29 point 4/73/20 3.4.  CBC notable for the following: White cell count 9600, hemoglobin 9.9 associated with normocytic value and relative to most recent prior hemoglobin of 8.3 on 05/23/2024 completely count 230.  Per my interpretation, EKG in ED demonstrated the following: Sinus tachycardia with heart rate 103, normal intervals, including QTc of 473, demonstrate no evidence of T wave or ST changes, including no evidence of ST elevation.   Imaging in the ED, per corresponding formal radiology read, was notable for the following: 1 view chest x-ray showed left perihilar opacities, otherwise demonstrated no evidence of acute cardiopulmonary process, including no evidence of edema, effusion, or pneumothorax.  While in the ED, the following were administered: Fentanyl  100 mcg IV x 1 dose, Compazine  10 mg IV x 1, insulin  drip was initiated via Endo tool, and the patient was started on continuous lactated Ringer 's running at 125 cc/h.  Subsequently, the patient was admitted for further evaluation and management of presenting hyperglycemia with evidence of early DKA.      Review of Systems: As per HPI otherwise 10 point review of systems negative.   Past Medical History:  Diagnosis Date   Abscess, gluteal, right 08/24/2013   Anemia 02/19/2012   Bartholin's gland abscess 09/19/2013   Bipolar disorder (HCC)    BV (bacterial vaginosis) 11/24/2015   Depression    Diabetes mellitus type I (HCC) 2001   Diagnosed at age 48 ; Type I   Diarrhea 05/30/2016   DKA (diabetic ketoacidoses) 08/19/2013   Also in 2018   ESRD (end stage renal disease) (HCC)    Gonorrhea 08/2011   Treated in 09/2011   HFrEF  (heart failure with reduced ejection fraction) (HCC)    a. 2022 Echo: EF 40%; b. 10/2021 Echo: EF 55%; b. 07/2022 MV: No ischemia. EF 31%; c. 08/2022 Echo: EF 35%, mildly dil RV, sev TR.   History of trichomoniasis 05/31/2016   Hyperlipidemia 03/28/2016   Hypertension    NICM (nonischemic cardiomyopathy) (HCC)    Seizures (HCC)    Sepsis (HCC) 09/19/2013    Past Surgical History:  Procedure Laterality Date   A/V FISTULAGRAM Right 06/17/2023   Procedure: A/V Fistulagram;  Surgeon: Marea Selinda RAMAN, MD;  Location: [facility] INVASIVE CV LAB;  Service: Cardiovascular;  Laterality: Right;   A/V SHUNT INTERVENTION N/A 09/25/2023   Procedure: A/V SHUNT INTERVENTION;  Surgeon: Pearline Norman RAMAN, MD;  Location: [facility] PV LAB;  Service: Cardiovascular;  Laterality: N/A;   AV FISTULA PLACEMENT Right 07/06/2022   Procedure: ARTERIOVENOUS GRAFT CREATION;  Surgeon: Gretta Lonni PARAS, MD;  Location[facility] OR;  Service: Vascular;  Laterality: Right;   CESAREAN SECTION N/A 10/05/2019   Procedure: CESAREAN SECTION;  Surgeon: Izell Harari, MD;  Location: [facility] LD ORS;  Service: Obstetrics;  Laterality: N/A;   CHOLECYSTECTOMY N/A 07/02/2023   Procedure: LAPAROSCOPIC CHOLECYSTECTOMY;  Surgeon: Ebbie Cough, MD;  Location: [facility] OR;  Service: General;  Laterality: N/A;   ESOPHAGOGASTRODUODENOSCOPY N/A 01/31/2024   Procedure: EGD (ESOPHAGOGASTRODUODENOSCOPY);  Surgeon: San Sandor GAILS, DO;  Location: [facility] ENDOSCOPY;  Service: Gastroenterology;  Laterality: N/A;   INCISION AND DRAINAGE ABSCESS Left 09/28/2019   Procedure: INCISION AND DRAINAGE VULVAR ABCESS;  Surgeon: Edsel Norleen GAILS, MD;  Location: [facility] OR;  Service: Gynecology;  Laterality: Left;   INCISION AND DRAINAGE ABSCESS Right 02/23/2024   Procedure: INCISION AND DRAINAGE, ABSCESS;  Surgeon: Tobie Eldora NOVAK, MD;  Location: [facility] OR;  Service: ENT;  Laterality: Right;   INCISION AND DRAINAGE PERIRECTAL ABSCESS Right 08/18/2013   Procedure: IRRIGATION AND DEBRIDEMENT  GLUTEAL ABSCESS;  Surgeon: Lynda Leos, MD;  Location: [facility] OR;  Service: General;  Laterality: Right;   INCISION AND DRAINAGE PERIRECTAL ABSCESS Right 09/19/2013   Procedure: IRRIGATION AND DEBRIDEMENT RIGHT GLUTEAL AND LABIAL ABSCESSES;  Surgeon: Lynda Leos, MD;  Location: [facility] OR;  Service: General;  Laterality: Right;   INCISION AND DRAINAGE PERIRECTAL ABSCESS Right 09/24/2013   Procedure: IRRIGATION AND DEBRIDEMENT PERIRECTAL ABSCESS;  Surgeon: Lynwood MALVA Pina, MD;  Location: [facility] OR;  Service: General;  Laterality: Right;   IR PARACENTESIS  08/28/2023   IR PARACENTESIS  11/04/2023   IR PARACENTESIS  12/23/2023   IR PARACENTESIS  02/04/2024   IR PARACENTESIS  03/23/2024   IR PARACENTESIS  04/06/2024    Social History:  reports that she has never smoked. She has never been exposed to tobacco smoke. She has never used smokeless tobacco. She reports that she does not currently use alcohol . She reports that she does not use drugs.   Allergies[1]  Family History  Problem Relation Age of Onset   Asthma Mother    Carpal tunnel syndrome Mother    Gout Father    Diabetes Paternal Grandmother    Anesthesia problems Neg Hx      Prior to Admission medications  Medication Sig Start Date End Date Taking? Authorizing Provider  albuterol  (VENTOLIN  HFA) 108 (90 Base) MCG/ACT inhaler Inhale 2 puffs into the lungs every 4 (four) hours as needed for wheezing or shortness of breath. 11/24/22   [provider]  bumetanide  (BUMEX ) 2 MG tablet Take 10 mg by mouth daily.    [provider]  carvedilol  (COREG ) 25 MG tablet Take 1 tablet (25 mg total) by mouth 2 (two) times daily with a meal. Follow with your PCP for refills. 04/18/24 05/21/24  Perri DELENA Meliton Mickey., MD  Cinnamon 500 MG capsule Take 500 mg by mouth in the morning.    [provider]  diazePAM , 15 MG Dose, 2 x 7.5 MG/0.1ML LQPK Place 15 mg into the nose as needed (sz lasting over 2 minutes). 04/28/24   Yadav,  Priyanka O, MD  DULoxetine  (CYMBALTA ) 20 MG capsule Take 1 capsule (20 mg total) by mouth daily. 05/04/24   Ghimire, Donalda HERO, MD  Elderberry-Vitamin C-Zinc  (ELDERBERRY IMMUNE HEALTH GUMMY PO) Take 1 tablet by mouth daily.    [provider]  fluticasone  (FLONASE ) 50 MCG/ACT nasal spray Place 2 sprays into both nostrils daily as needed for allergies or rhinitis. 12/11/23 12/10/24  [provider]  hydrOXYzine  (ATARAX ) 25 MG tablet Take 1 tablet (25 mg total) by mouth 3 (three) times daily as needed for anxiety, itching, nausea or vomiting. 05/04/24   Ghimire, Donalda HERO, MD  insulin  aspart (NOVOLOG ) 100 UNIT/ML FlexPen Inject 2 Units into the skin 3 (three) times daily with meals. 05/10/24   Cindy Garnette POUR, MD  lacosamide  (VIMPAT ) 50 MG TABS tablet Take 2 tablets (100 mg total) by mouth 2 (two) times daily. Take additional 1 tablet (50 mg) after dialysis on Tuesdays, Thursdays and Saturdays 05/04/24   Raenelle Donalda HERO, MD  LANTUS  SOLOSTAR 100 UNIT/ML Solostar Pen Inject 5 Units into the  skin daily. 04/18/24   Perri DELENA Meliton Mickey., MD  methocarbamol  (ROBAXIN ) 500 MG tablet Take 1 tablet (500 mg total) by mouth 4 (four) times daily. 05/10/24   Cindy Garnette POUR, MD  OLANZapine  (ZYPREXA ) 5 MG tablet Take 1 tablet (5 mg total) by mouth at bedtime. 05/04/24   Ghimire, Donalda HERO, MD  oxyCODONE -acetaminophen  (PERCOCET/ROXICET) 5-325 MG tablet Take 1 tablet by mouth every 8 (eight) hours as needed for moderate pain (pain score 4-6). 05/10/24   Cindy Garnette POUR, MD  Pancrelipase , Lip-Prot-Amyl, 3000-9500 units CPEP Take 3,000 Units by mouth in the morning, at noon, and at bedtime.    [provider]  prochlorperazine  (COMPAZINE ) 5 MG tablet Take 5 mg by mouth every 6 (six) hours as needed for nausea or vomiting.    [provider]  sevelamer  carbonate (RENVELA ) 800 MG tablet Take 1 tablet (800 mg total) by mouth 3 (three) times daily with meals. 02/07/24   Drusilla Sabas RAMAN, MD  SUMAtriptan   (IMITREX ) 50 MG tablet Take 50 mg by mouth every 2 (two) hours as needed for migraine or headache. 06/20/22   [provider]  VELTASSA  16.8 g PACK Take 1 packet by mouth daily at 12 noon. 04/03/24   [provider]     Objective    Physical Exam: Vitals:   05/24/24 1623 05/24/24 1624 05/24/24 1645 05/24/24 1900  BP:  (!) 179/98 (!) 175/94 (!) 154/142  Pulse:  (!) 103 100 99  Resp:  16 (!) 8 (!) 7  Temp:  98.6 F (37 C)    TempSrc:  Oral    SpO2:  97% 94% 100%  Weight: 64 kg     Height: 5' 3 (1.6 m)       General: appears to be stated age; alert Skin: warm, dry, no rash Head:  AT/[place] Mouth:  Oral mucosa membranes appear dry, normal dentition Neck: supple; trachea midline Heart:  RRR; did not appreciate any M/R/G Lungs: CTAB, did not appreciate any wheezes, rales, or rhonchi Abdomen: + BS; soft, mild generalized tenderness to palpation, in the absence of any associated guarding, rigidity, or rebound tenderness Extremities: no peripheral edema, no muscle wasting         Labs on Admission: I have personally reviewed following labs and imaging studies  CBC: Recent Labs  Lab 05/20/24 1632 05/20/24 1707 05/20/24 1719 05/20/24 2038 05/23/24 0500 05/24/24 1748 05/24/24 1749  WBC 10.4  --   --  11.3* 9.8  --  9.6  NEUTROABS 5.4  --   --   --   --   --  3.9  HGB 8.4*   < > 9.9* 8.2* 8.3* 10.9* 9.9*  HCT 29.1*   < > 29.0* 27.4* 26.6* 32.0* 33.9*  MCV 92.4  --   --  88.1 85.3  --  91.4  PLT 300  --   --  305 239  --  230   < > = values in this interval not displayed.   Basic Metabolic Panel: Recent Labs  Lab 05/21/24 0852 05/21/24 2233 05/22/24 0550 05/23/24 0500 05/24/24 1748 05/24/24 1749  NA 140 137 136 139 132* 133*  K 4.0 3.9 3.9 4.6 4.9 5.3*  CL 101 97* 98 103  --  94*  CO2 22 27 25 25   --  23  GLUCOSE 83 206* 186* 99  --  822*  BUN 47* 23* 24* 28*  --  23*  CREATININE 6.60* 3.98* 4.30* 5.11*  --  4.50*  CALCIUM  7.4* 7.3* 7.7* 8.0*  --   8.2*  PHOS  --   --  3.9 5.6*  --   --    GFR: Estimated Creatinine Clearance: 16.3 mL/min (A) (by C-G formula based on SCr of 4.5 mg/dL (H)). Liver Function Tests: Recent Labs  Lab 05/22/24 0550 05/23/24 0500 05/24/24 1749  AST  --   --  133*  ALT  --   --  63*  ALKPHOS  --   --  568*  BILITOT  --   --  0.5  PROT  --   --  6.0*  ALBUMIN  3.2* 3.2* 3.9   Recent Labs  Lab 05/24/24 1749  LIPASE 15   No results for input(s): AMMONIA in the last 168 hours. Coagulation Profile: No results for input(s): INR, PROTIME in the last 168 hours. Cardiac Enzymes: No results for input(s): CKTOTAL, CKMB, CKMBINDEX, TROPONINI in the last 168 hours. BNP (last 3 results) Recent Labs    05/01/24 0443 05/20/24 1632  PROBNP >35,000.0* >35,000.0*   HbA1C: No results for input(s): HGBA1C in the last 72 hours. CBG: Recent Labs  Lab 05/23/24 0435 05/23/24 0733 05/24/24 1632 05/24/24 1913 05/24/24 2032  GLUCAP 84 81 >600* >600* >600*   Lipid Profile: No results for input(s): CHOL, HDL, LDLCALC, TRIG, CHOLHDL, LDLDIRECT in the last 72 hours. Thyroid  Function Tests: No results for input(s): TSH, T4TOTAL, FREET4, T3FREE, THYROIDAB in the last 72 hours. Anemia Panel: No results for input(s): VITAMINB12, FOLATE, FERRITIN, TIBC, IRON , RETICCTPCT in the last 72 hours. Urine analysis:    Component Value Date/Time   COLORURINE YELLOW 04/13/2024 1749   APPEARANCEUR HAZY (A) 04/13/2024 1749   APPEARANCEUR Hazy 11/10/2013 2043   LABSPEC 1.018 04/13/2024 1749   LABSPEC 1.031 11/10/2013 2043   PHURINE 7.0 04/13/2024 1749   GLUCOSEU >=500 (A) 04/13/2024 1749   GLUCOSEU >=500 11/10/2013 2043   HGBUR NEGATIVE 04/13/2024 1749   BILIRUBINUR NEGATIVE 04/13/2024 1749   BILIRUBINUR negative 06/04/2018 1035   BILIRUBINUR Negative 11/24/2015 1443   BILIRUBINUR Negative 11/10/2013 2043   KETONESUR 5 (A) 04/13/2024 1749   PROTEINUR >=300 (A)  04/13/2024 1749   UROBILINOGEN 0.2 09/14/2019 1732   NITRITE NEGATIVE 04/13/2024 1749   LEUKOCYTESUR NEGATIVE 04/13/2024 1749   LEUKOCYTESUR 1+ 11/10/2013 2043    Radiological Exams on Admission: DG Chest Port 1 View Result Date: 05/24/2024 EXAM: 1 VIEW XRAY OF THE CHEST 05/24/2024 07:46:41 PM COMPARISON: 05/20/2024 CLINICAL HISTORY: Upper abdominal pain. FINDINGS: LINES, TUBES AND DEVICES: Overlying wires noted. LUNGS AND PLEURA: Left perihilar opacities. Low lung volumes. No pleural effusion. No pneumothorax. HEART AND MEDIASTINUM: Cardiomegaly. BONES AND SOFT TISSUES: No acute osseous abnormality. IMPRESSION: 1. Left perihilar opacities. Electronically signed by: Elsie Gravely MD 05/24/2024 07:50 PM EST RP Workstation: [id number]      Assessment/Plan   Principal Problem:   DKA (diabetic ketoacidosis) (HCC) Active Problems:   Type 1 diabetes mellitus (HCC)   Transaminitis   ESRD on dialysis (HCC)   Chronic combined systolic and diastolic heart failure (HCC)   History of seizures   History of anemia due to chronic kidney disease   Chronic abdominal pain   History of bipolar disorder   Severe tricuspid regurgitation   History of essential hypertension      #) Diabetic ketoacidosis: In the setting of known history of poorly controlled type 1 diabetes with most recent A1c of 8.5 on 03/30/25 , with prior hospitalizations for DKA, on home insulin  in the form of  Lantus  5  units SQ qdaily as well as NovoLog  sliding scale 3 times daily with meals, she presents today with hyperglycemia, presenting blood sugar greater than 800, with additional clinical findings suggestive of borderline DKA in the context of presenting anion gap metabolic acidosis, VBG demonstrating evidence of relative metabolic acidosis, We will beta-hydroxybutyrate acid levels currently pending.   This appears to be as a result of the patient's suboptimal compliance with her outpatient insulin , noting that she has not  taken any of her home insulin  following discharge to home from the hospital yesterday, including no basal insulin  or any short acting insulin  in the interval since yesterday's discharge to home.   No overt evidence to suggest precipitating underlying infectious process at this time.  Chest x-ray shows left perihilar opacities, although in the absence of any sob, fever, leukocytosis, this finding appears less likely to be associated with pneumonia.  Rather , suspect contribution towards this radiographic finding from pulmonary vascular congestion in the context of her chronic systolic/diastolic heart failure.  Will add on procalcitonin level and assess proBNP to further evaluate.  In the absence of objective fever and in the absence of leukocytosis, SIRS criteria for sepsis are not currently met.  She appears hemodynamically stable.  Consequently, in the absence of evidence of overt underlying infectious process, we will refrain from initiation of antibiotics at this time.  In terms of considering other potential factors contributing to her recurrent DKA, no evidence to suggest ACS at this time, in the absence of any recent chest pain, presenting EKG shows no evidence of acute ischemic changes, including no evidence of STEMI.  In the ED, insulin  drip was initiated, and she was started on continuous lactated Ringer 's, in the absence of preceding bolus due to her risk for insulin  development of acute volume overload given her history of chronic systolic/diastolic heart failure.  Presenting potassium level noted to be 5.3, which appears most consistent with pseudohyperkalemia in the setting of extracellular shift and potassium due to concomitant presenting metabolic acidosis as a result of her DKA.  She is noted to have undergone complete hemodialysis session yesterday rendering hyperkalemia on the basis of (renal disease to be less likely at this time.  Will hold her outpatient Veltassa  for now, and closely  monitor ensuing potassium trend, which should improve with management of DKA, including interval insulin  drip. Will hold next dose of Coreg  until assurance of improvement in potassium level with these measures.   Plan: DKA protocol initiated. Insulin  drip per Endotool dka protocol. Q1H cbg's.  Check beta-hydroxybutyrate acid level now, followed by every 4 hours beta-hydroxybutyrate acid levels ordered through 1300 on 05/25/24.  Q4H BMP's ordered through 1300 on 05/25/2024.  IVF's: continue existing lactated Ringer 's at 125 cc/h, with plan to transition existing IV fluids to D5 LR once CBG reflects glucose less than 250. Check serum Mg and Phos levels, w/ prn supplementation per protocol. Once AG has closed and patient tolerating PO, will reinitiate sq basal insulin  while continuing insulin  drip for an additional two hours to prevent rebound hyperglycemia, before ultimately turning off the insulin  drip. Until then, holding home insulin . Monitor on telemetry. Monitor strict I's & O's.  The importance of improved compliance with home insulin  regimen was emphasized to patient. Prn IV Compazine , with as needed IV Reglan  for refractory nausea/vomiting. Prn IV fentanyl  for abdominal pain.  check  Procalcitonin as well as proBNP, as further detailed above.  Hold next dose of Coreg , as above.  Repeat VBG in the morning.Ashley Freeman                       #)  Chronic abdominal discomfort: Documented history of chronic abdominal discomfort, with suspected contributions from congestive gastropathy and congestive hepatopathy as result of relative chronic right-sided heart failure in the setting of her severe tricuspid regurgitation, with history of chronic noncholestatic transaminitis, with presenting liver enzymes consistent with baseline degree of elevation, including no elevation in total bilirubin to suggest a cholestatic process, with notable history that includes chronic ascites.  The patient conveys slight  worsening of her abdominal discomfort over the last day relative to that which she is experiencing during this most recent prior hospitalization, but still within the breadth of her baseline chronic abdominal discomfort.  No evidence of acute peritoneal findings on physical exam.  Given that her abdominal discomfort is within the realm of her baseline, with liver enzymes consistent with baseline, will noting that she underwent CT abdomen/pelvis on 05/20/2024 in the setting of similar abdominal discomfort, with this imaging showed no evidence of acute intra-abdominal or acute intrapelvic process, will refrain from additional abdominal imaging at this time.  Additionally, presenting lipase is nonelevated.  There may have been an element of fluid shift following removal of 5 L of fluid with yesterday's hemodialysis session and may have been contributory to her ensuing intensification of her chronic abdominal discomfort.  No clinical evidence to suggest bowel obstruction.  She also denies any associated chest pain, We will EKG shows no evidence of acute ischemic changes.  No exquisite tenderness on palpation, and the absence of any fever or leukocytosis is also suggestive against SBP.  Plan: Monitor strict I's and O's and daily weights.  Add on proBNP and recheck proBNP in the morning.  Repeat lipase in the morning, CMP in the morning.  Prn IV fentanyl .  NSAIDs [place].  Protonix  40 mg IV daily in the setting of suspected congestive gastropathy.  CBC in the morning.                        #) ESRD: on HD.  Completed a full hemodialysis session yesterday, 05/23/2024, at which time 5 L of fluid was removed .  It appears that she would be next due for her scheduled hemodialysis on Tuesday, 05/26/2024.  No clinical evidence for urgent overnight HD or to expedite HD relative to this timeframe, including no evidence of acute volume overload, while very mild elevation of potassium is felt to be most  consistent with pseudohyperkalemia as a result of her presenting DKA, as above.   Plan: monitor strict I's/O's, daily weights. CMP in the AM. Check mag and phos levels.  Resume home Renvela .  Hold next dose of Veltassa  due to increased risk for ensuing development of hypokalemia on insulin  drip as component of DKA management.  Follow for result of every 4 hour BMPs ordered through 1300 on 05/25/2024 as component of DKA management.                         #) Bipolar disorder: Documented history of such, for which he is on scheduled olanzapine  as well as Cymbalta  as an outpatient.  Plan: Resume outpatient olanzapine  and Cymbalta .                     #) Chronic systolic/diastolic heart failure: Documented history of such, felt to be consistent with nonischemic cardiomyopathy, with most recent echocardiogram performed on 08/29/2023 and notable for LVEF 30 to 35%, grade 2 diastolic dysfunction as well as severe tricuspid regurgitation, with the  latter felt to be contributory towards her chronic abdominal discomfort, congestive hepatopathy, congestive gastropathy, with suspicion that this is contributing to her chronic abdominal discomfort.  No clinical evidence to suggest acute decompensation of her heart failure at this time, noting that she underwent full hemodialysis session yesterday with removal 5 L of fluid at that time.  Will closely monitor for ensuing evidence of development of acute volume overload given plan for IV fluids as a component of management of her presenting DKA.  In this context, IV fluid boluses have been held.  Outpatient medical management also includes Coreg .  Plan: Add on proBNP, and repeat proBNP in the morning.  Monitor strict I's and O's and daily weights.  Check serum magnesium  level.  Will resume Coreg , with next dose to occur tomorrow morning.                          #) Essential Hypertension: documented h/o such,  with outpatient antihypertensive regimen including Coreg .  SBP's in the ED today: 150s to 170s mmHg with corresponding heart rates in the 90s to low 100s.   Plan: Close monitoring of subsequent BP via routine VS. resume home Coreg , as above.  As needed IV labetalol  for systolic pressure greater than 180 or diastolic blood pressure greater than 120 mmHg. monitor on telemetry.  Monitor strict I's and O's and daily weights.  Follow potassium trend via existing orders for every 4 hour BMPs, which were ordered as a component of ongoing evaluation of her presenting DKA.                          #) History of seizures: Documented history of such, without clinical evidence to suggest active seizures at this time.  Outpatient antiepileptic regimen consists of: Vimpat .   Plan: Continue outpatient antiepileptic regimen.                         #) Anemia of chronic kidney disease: Documented history of such, a/w with baseline hgb range 8-11, with presenting hgb consistent with this range, in the absence of any overt evidence of active bleed.     Plan: Repeat CBC in the morning.  Check PTT, INR.                        #) Chronic transaminitis: Documented history of such, with mild elevation in alkaline phosphatase, AST, and ALT dating back over the last few years, with presenting liver enzymes consistent with her baseline elevation, as further quantified above.  No elevation in total bilirubin to suggest a cholestatic process.  Rather, suspect chronic transaminitis as result of congestive hepatopathy in setting of her chronic systolic/diastolic heart failure with likely relative right-sided heart failure, further exacerbated by her documented history of severe tricuspid regurgitation, with chronic ascites.  Most recent CT abdomen/pelvis performed on 05/20/2024 showed no evidence of acute intra-abdominal or acute intrapelvic process and noted her  history of cholecystectomy.  Given the longevity of her suspected congestive hepatopathy, will also check PTT, INR to evaluate hepatic synthetic function.  As noted above, SBP felt to be less likely in the absence of presenting fever and in the absence of leukocytosis.  Presenting lipase nonelevated.  Plan: Repeat CMP in the morning.  Check PTT, INR, as further detailed above.  Monitor strict I's and O's and daily weights.  Check proBNP, as  above.  Further evaluation management of her chronic systolic/diastolic heart failure, as above.         DVT prophylaxis: SCD's   Code Status: Full code Family Communication: none Disposition Plan: Per Rounding Team Consults called: none;  Admission status: obs     I SPENT GREATER THAN 75  MINUTES IN CLINICAL CARE TIME/MEDICAL DECISION-MAKING IN COMPLETING THIS ADMISSION.      Eva NOVAK Dvon Jiles DO [facility]  From 7PM - 7AM   05/24/2024, 9:02 PM         [1]  Allergies Allergen Reactions   Keflex  [Cephalexin ] Anaphylaxis    Ceftriaxone  in the past with no reaction   Penicillins Anaphylaxis, Hives and Rash   Vibramycin  [Doxycycline ] Anaphylaxis   Benadryl  [Diphenhydramine ] Itching   Entresto  [Sacubitril -Valsartan ] Swelling    angioedema   Dilaudid  [Hydromorphone ] Itching   Losartan  Dermatitis   Methotrexate And Trimetrexate Rash   Roxicodone  [Oxycodone ] Itching    Takes Percocet without issue   "

## 2024-05-24 NOTE — Progress Notes (Signed)
 There was a consult for placing a PIV access. Patient's nurse stated that patient has one PIV access. Talked to patient's nurse that will cancel the consult. HS [employer]

## 2024-05-24 NOTE — ED Provider Notes (Addendum)
 " [facility] EMERGENCY DEPARTMENT AT [facility] Provider Note   CSN: [id number] Arrival date & time: 05/24/24  1616     Patient presents with: Abdominal Pain and Cough   Ashley Freeman is a 34 y.o. female.   Pt is a 33y/o female with hx of type 1 DM, ESRD on HD, sz disorder, diastolic HFrEF (30-35%)and bipolar disorder with Multiple hospitalizations for DKA due to poor complaince with both DM meds and HD with complaint of Presenting today abdominal pain.  She reports that she was discharged from the hospital yesterday and she did go to dialysis and received a full course.  They took 5 L off she reports after getting home in the [place] she woke up with severe abdominal pain.  It is most intense in the right upper quadrant but she feels pain throughout.  She had a somewhat loose bowel movement in the middle of the night and denies having any constipation.  No blood in her stool.  She has not had any vomiting but reports that she has not eaten because she has not felt good.  She has also not taken her insulin  because she reports she was too sick to take care of it.  She does have a history of abdominal pain but reports she does not take anything for it because usually nothing helps but this feels different.  She has not had any type of abdominal intervention recently such as paracentesis.  She feels that her abdominal distention is similar to before dialysis yesterday.  She has not had a fever but did report to the nurse that she had had a cough for the last few days.  She is denying any shortness of breath.  She has not checked her blood sugar today.   The history is provided by the patient and the EMS personnel.  Abdominal Pain Associated symptoms: cough   Cough      Prior to Admission medications  Medication Sig Start Date End Date Taking? Authorizing Provider  albuterol  (VENTOLIN  HFA) 108 (90 Base) MCG/ACT inhaler Inhale 2 puffs into the lungs every 4 (four) hours  as needed for wheezing or shortness of breath. 11/24/22   [provider]  bumetanide  (BUMEX ) 2 MG tablet Take 10 mg by mouth daily.    [provider]  carvedilol  (COREG ) 25 MG tablet Take 1 tablet (25 mg total) by mouth 2 (two) times daily with a meal. Follow with your PCP for refills. 04/18/24 05/21/24  Perri DELENA Meliton Mickey., MD  Cinnamon 500 MG capsule Take 500 mg by mouth in the morning.    [provider]  diazePAM , 15 MG Dose, 2 x 7.5 MG/0.1ML LQPK Place 15 mg into the nose as needed (sz lasting over 2 minutes). 04/28/24   Yadav, Priyanka O, MD  DULoxetine  (CYMBALTA ) 20 MG capsule Take 1 capsule (20 mg total) by mouth daily. 05/04/24   Ghimire, Donalda HERO, MD  E[place]berry-Vitamin C-Zinc  (ELDERBERRY IMMUNE HEALTH GUMMY PO) Take 1 tablet by mouth daily.    [provider]  fluticasone  (FLONASE ) 50 MCG/ACT nasal spray Place 2 sprays into both nostrils daily as needed for allergies or rhinitis. 12/11/23 12/10/24  [provider]  hydrOXYzine  (ATARAX ) 25 MG tablet Take 1 tablet (25 mg total) by mouth 3 (three) times daily as needed for anxiety, itching, nausea or vomiting. 05/04/24   Ghimire, Donalda HERO, MD  insulin  aspart (NOVOLOG ) 100 UNIT/ML FlexPen Inject 2 Units into the skin 3 (three) times daily  with meals. 05/10/24   Cindy Garnette POUR, MD  lacosamide  (VIMPAT ) 50 MG TABS tablet Take 2 tablets (100 mg total) by mouth 2 (two) times daily. Take additional 1 tablet (50 mg) after dialysis on Tuesdays, Thursdays and Saturdays 05/04/24   Raenelle Donalda HERO, MD  LANTUS  SOLOSTAR 100 UNIT/ML Solostar Pen Inject 5 Units into the skin daily. 04/18/24   Perri DELENA Meliton Mickey., MD  methocarbamol  (ROBAXIN ) 500 MG tablet Take 1 tablet (500 mg total) by mouth 4 (four) times daily. 05/10/24   Cindy Garnette POUR, MD  OLANZapine  (ZYPREXA ) 5 MG tablet Take 1 tablet (5 mg total) by mouth at bedtime. 05/04/24   Ghimire, Donalda HERO, MD  oxyCODONE -acetaminophen  (PERCOCET/ROXICET) 5-325 MG tablet  Take 1 tablet by mouth every 8 (eight) hours as needed for moderate pain (pain score 4-6). 05/10/24   Cindy Garnette POUR, MD  Pancrelipase , Lip-Prot-Amyl, 3000-9500 units CPEP Take 3,000 Units by mouth in the morning, at noon, and at bedtime.    [provider]  prochlorperazine  (COMPAZINE ) 5 MG tablet Take 5 mg by mouth every 6 (six) hours as needed for nausea or vomiting.    [provider]  sevelamer  carbonate (RENVELA ) 800 MG tablet Take 1 tablet (800 mg total) by mouth 3 (three) times daily with meals. 02/07/24   Drusilla Sabas RAMAN, MD  SUMAtriptan  (IMITREX ) 50 MG tablet Take 50 mg by mouth every 2 (two) hours as needed for migraine or headache. 06/20/22   [provider]  VELTASSA  16.8 g PACK Take 1 packet by mouth daily at 12 noon. 04/03/24   [provider]    Allergies: Keflex  [cephalexin ], Penicillins, Vibramycin  [doxycycline ], Benadryl  [diphenhydramine ], Entresto  [sacubitril -valsartan ], Dilaudid  [hydromorphone ], Losartan , Methotrexate and trimetrexate, and Roxicodone  [oxycodone ]    Review of Systems  Respiratory:  Positive for cough.   Gastrointestinal:  Positive for abdominal pain.    Updated Vital Signs BP (!) 154/142   Pulse 99   Temp 98.6 F (37 C) (Oral)   Resp (!) 7   Ht 5' 3 (1.6 m)   Wt 64 kg   SpO2 100%   BMI 24.98 kg/m   Physical Exam Vitals and nursing note reviewed.  Constitutional:      General: She is not in acute distress.    Appearance: She is well-developed.     Comments: Appears uncomfortable but does not smell of ketones  HENT:     Head: Normocephalic and atraumatic.  Eyes:     Pupils: Pupils are equal, round, and reactive to light.  Cardiovascular:     Rate and Rhythm: Regular rhythm. Tachycardia present.     Heart sounds: Normal heart sounds. No murmur heard.    No friction rub.  Pulmonary:     Effort: Pulmonary effort is normal.     Breath sounds: Normal breath sounds. No wheezing or rales.  Abdominal:      General: Bowel sounds are normal. There is distension.     Palpations: Abdomen is soft.     Tenderness: There is abdominal tenderness. There is no guarding or rebound.     Comments: Abdomen is significantly distended with ascites present.  Diffuse to mild pain but some guarding in the right upper quadrant  Musculoskeletal:        General: No tenderness. Normal range of motion.     Right lower leg: Edema present.     Left lower leg: Edema present.     Comments: 1+ edema present in bilateral lower extremities  Skin:  General: Skin is warm and dry.     Findings: No rash.  Neurological:     Mental Status: She is alert and oriented to person, place, and time. Mental status is at baseline.     Cranial Nerves: No cranial nerve deficit.  Psychiatric:        Behavior: Behavior normal.     (all labs ordered are listed, but only abnormal results are displayed) Labs Reviewed  CBC WITH DIFFERENTIAL/PLATELET - Abnormal; Notable for the following components:      Result Value   RBC 3.71 (*)    Hemoglobin 9.9 (*)    HCT 33.9 (*)    MCHC 29.2 (*)    RDW 16.0 (*)    Eosinophils Absolute 1.5 (*)    All other components within normal limits  COMPREHENSIVE METABOLIC PANEL WITH GFR - Abnormal; Notable for the following components:   Sodium 133 (*)    Potassium 5.3 (*)    Chloride 94 (*)    Glucose, Bld 822 (*)    BUN 23 (*)    Creatinine, Ser 4.50 (*)    Calcium  8.2 (*)    Total Protein 6.0 (*)    AST 133 (*)    ALT 63 (*)    Alkaline Phosphatase 568 (*)    GFR, Estimated 13 (*)    Anion gap 16 (*)    All other components within normal limits  CBG MONITORING, ED - Abnormal; Notable for the following components:   Glucose-Capillary >600 (*)    All other components within normal limits  I-STAT VENOUS BLOOD GAS, ED - Abnormal; Notable for the following components:   pH, Ven 7.510 (*)    pCO2, Ven 29.4 (*)    pO2, Ven 73 (*)    Sodium 132 (*)    Calcium , Ion 0.88 (*)    HCT 32.0 (*)     Hemoglobin 10.9 (*)    All other components within normal limits  CBG MONITORING, ED - Abnormal; Notable for the following components:   Glucose-Capillary >600 (*)    All other components within normal limits  LIPASE, BLOOD    EKG: EKG Interpretation Date/Time:  Sunday May 24 2024 16:27:07 EST Ventricular Rate:  103 PR Interval:  140 QRS Duration:  84 QT Interval:  361 QTC Calculation: 473 R Axis:   79  Text Interpretation: Sinus tachycardia No significant change since last tracing Confirmed by Doretha Folks ([id number]) on 05/24/2024 4:47:28 PM  Radiology: No results found.   Procedures   Medications Ordered in the ED  fentaNYL  (SUBLIMAZE ) injection 50 mcg (50 mcg Nasal Not Given 05/24/24 1755)  prochlorperazine  (COMPAZINE ) injection 10 mg (10 mg Intramuscular Not Given 05/24/24 1757)  insulin  regular, human (MYXREDLIN ) 100 units/ 100 mL infusion (5.5 Units/hr Intravenous New Bag/Given 05/24/24 1932)  lactated ringers  infusion ( Intravenous New Bag/Given 05/24/24 1929)  dextrose  5 % in lactated ringers  infusion (has no administration in time range)  dextrose  50 % solution 0-50 mL (has no administration in time range)  fentaNYL  (SUBLIMAZE ) injection 50 mcg (has no administration in time range)  fentaNYL  (SUBLIMAZE ) injection 100 mcg (100 mcg Intravenous Given 05/24/24 1803)  prochlorperazine  (COMPAZINE ) injection 10 mg (10 mg Intravenous Given 05/24/24 1804)                                    Medical Decision Making Amount and/or Complexity of Data Reviewed External Data Reviewed:  notes. Labs: ordered. Decision-making details documented in ED Course. Radiology: ordered and independent interpretation performed. Decision-making details documented in ED Course. ECG/medicine tests: ordered and independent interpretation performed. Decision-making details documented in ED Course.  Risk Prescription drug management. Decision regarding hospitalization.   Pt with multiple  medical problems and comorbidities and presenting today with a complaint that caries a high risk for morbidity and mortality.  Here today with the above complaint.  Patient has not taken her insulin  today and when blood sugar was checked it is greater than 600.  However she is not having classic findings of DKA at this time as she is not having any vomiting and does not smell of ketones however concerned that she is heading this way.  Unclear what the cause of her abdominal pain today.  She did have 5 L removed from dialysis and concern that it could be from fluid shifts.  She is already status post cholecystectomy and low suspicion that this is gallbladder pathology.  Also lower suspicion for obstruction as patient is not having vomiting and is having stools.  She is not having excessive diarrhea concerning for C. difficile and has not been on antibiotics recently.  She did complain of a mild cough and concern for possible developing pneumonia.  Low suspicion for SBP as patient has not had any type of paracentesis done in quite some time.  She is chronically ill a very hard stick and poorly compliant. I independently interpreted patient's labs and EKG.  EKG with a sinus tachycardia but no acute changes, CBC without acute changes and stable hemoglobin of 9, normal white count, CMP with findings consistent with end-stage renal disease with open potassium of 5.3, patient's LFTs are elevated today with an AST of 133 and ALT of 63 which is higher than earlier this month but normal bilirubin and patient is status post cholecystectomy.  Patient's blood sugar was also 822 with an anion gap of 16, VBG currently with a normal bicarb, pH of 7.5 and a CO2 of 29.  Patient had a CT of her abdomen done within the last week which showed diffuse anasarca and fluid but no other acute issues.  Patient is not having symptoms suggestive of obstruction.  Attempted to get a chest x-ray and patient refused.  Sats have been on 100% and  lower suspicion for an pneumonia or acute infectious etiology.  Low suspicion for peritonitis.  Patient was started on an insulin  drip due to elevated blood sugar.  Will need repeat admission.  Hospitalist consulted. CRITICAL CARE Performed by: Kamal Jurgens Total critical care time: 30 minutes Critical care time was exclusive of separately billable procedures and treating other patients. Critical care was necessary to treat or prevent imminent or life-threatening deterioration. Critical care was time spent personally by me on the following activities: development of treatment plan with patient and/or surrogate as well as nursing, discussions with consultants, evaluation of patient's response to treatment, examination of patient, obtaining history from patient or surrogate, ordering and performing treatments and interventions, ordering and review of laboratory studies, ordering and review of radiographic studies, pulse oximetry and re-evaluation of patient's condition.      Final diagnoses:  Right upper quadrant abdominal pain  Hyperglycemia    ED Discharge Orders     None          Doretha Folks, MD 05/24/24 8067    Doretha Folks, MD 05/24/24 1933  "

## 2024-05-24 NOTE — ED Notes (Signed)
 Having difficult

## 2024-05-24 NOTE — ED Triage Notes (Signed)
 Pt to ED via [facility] from home c/o abdominal pain that started last night, pt has also had a cough x a couple of days. Pt discharged from admission to hospital yesterday. Reports had full dialysis yesterday   Last VS: 160/80, 92 PULSE, 98%RA,  CBG 250.   No medications given by EMS

## 2024-05-25 DIAGNOSIS — Z79899 Other long term (current) drug therapy: Secondary | ICD-10-CM | POA: Diagnosis not present

## 2024-05-25 DIAGNOSIS — Z765 Malingerer [conscious simulation]: Secondary | ICD-10-CM | POA: Diagnosis not present

## 2024-05-25 DIAGNOSIS — Z794 Long term (current) use of insulin: Secondary | ICD-10-CM | POA: Diagnosis not present

## 2024-05-25 DIAGNOSIS — E1022 Type 1 diabetes mellitus with diabetic chronic kidney disease: Secondary | ICD-10-CM | POA: Diagnosis present

## 2024-05-25 DIAGNOSIS — D631 Anemia in chronic kidney disease: Secondary | ICD-10-CM | POA: Diagnosis present

## 2024-05-25 DIAGNOSIS — Z825 Family history of asthma and other chronic lower respiratory diseases: Secondary | ICD-10-CM | POA: Diagnosis not present

## 2024-05-25 DIAGNOSIS — I5082 Biventricular heart failure: Secondary | ICD-10-CM | POA: Diagnosis present

## 2024-05-25 DIAGNOSIS — I132 Hypertensive heart and chronic kidney disease with heart failure and with stage 5 chronic kidney disease, or end stage renal disease: Secondary | ICD-10-CM | POA: Diagnosis present

## 2024-05-25 DIAGNOSIS — N186 End stage renal disease: Secondary | ICD-10-CM | POA: Diagnosis present

## 2024-05-25 DIAGNOSIS — F319 Bipolar disorder, unspecified: Secondary | ICD-10-CM | POA: Diagnosis present

## 2024-05-25 DIAGNOSIS — Z5329 Procedure and treatment not carried out because of patient's decision for other reasons: Secondary | ICD-10-CM | POA: Diagnosis present

## 2024-05-25 DIAGNOSIS — Z833 Family history of diabetes mellitus: Secondary | ICD-10-CM | POA: Diagnosis not present

## 2024-05-25 DIAGNOSIS — R188 Other ascites: Secondary | ICD-10-CM | POA: Diagnosis present

## 2024-05-25 DIAGNOSIS — E785 Hyperlipidemia, unspecified: Secondary | ICD-10-CM | POA: Diagnosis present

## 2024-05-25 DIAGNOSIS — I428 Other cardiomyopathies: Secondary | ICD-10-CM | POA: Diagnosis present

## 2024-05-25 DIAGNOSIS — G8929 Other chronic pain: Secondary | ICD-10-CM | POA: Diagnosis present

## 2024-05-25 DIAGNOSIS — E1010 Type 1 diabetes mellitus with ketoacidosis without coma: Secondary | ICD-10-CM | POA: Diagnosis present

## 2024-05-25 DIAGNOSIS — G40909 Epilepsy, unspecified, not intractable, without status epilepticus: Secondary | ICD-10-CM | POA: Diagnosis present

## 2024-05-25 DIAGNOSIS — Z5982 Transportation insecurity: Secondary | ICD-10-CM | POA: Diagnosis not present

## 2024-05-25 DIAGNOSIS — Z91158 Patient's noncompliance with renal dialysis for other reason: Secondary | ICD-10-CM | POA: Diagnosis not present

## 2024-05-25 DIAGNOSIS — Z992 Dependence on renal dialysis: Secondary | ICD-10-CM | POA: Diagnosis not present

## 2024-05-25 DIAGNOSIS — I5042 Chronic combined systolic (congestive) and diastolic (congestive) heart failure: Secondary | ICD-10-CM | POA: Diagnosis present

## 2024-05-25 DIAGNOSIS — E10649 Type 1 diabetes mellitus with hypoglycemia without coma: Secondary | ICD-10-CM | POA: Diagnosis not present

## 2024-05-25 DIAGNOSIS — R739 Hyperglycemia, unspecified: Secondary | ICD-10-CM | POA: Diagnosis present

## 2024-05-25 LAB — CBC WITH DIFFERENTIAL/PLATELET
Basophils Absolute: 0.2 10*3/uL — ABNORMAL HIGH (ref 0.0–0.1)
Basophils Relative: 1 %
Eosinophils Absolute: 2.4 10*3/uL — ABNORMAL HIGH (ref 0.0–0.5)
Eosinophils Relative: 13 %
HCT: 32.4 % — ABNORMAL LOW (ref 36.0–46.0)
Hemoglobin: 9.7 g/dL — ABNORMAL LOW (ref 12.0–15.0)
Lymphocytes Relative: 48 %
Lymphs Abs: 9 10*3/uL — ABNORMAL HIGH (ref 0.7–4.0)
MCH: 26.6 pg (ref 26.0–34.0)
MCHC: 29.9 g/dL — ABNORMAL LOW (ref 30.0–36.0)
MCV: 89 fL (ref 80.0–100.0)
Monocytes Absolute: 1.1 10*3/uL — ABNORMAL HIGH (ref 0.1–1.0)
Monocytes Relative: 6 %
Neutro Abs: 6 10*3/uL (ref 1.7–7.7)
Neutrophils Relative %: 32 %
Platelets: 233 10*3/uL (ref 150–400)
RBC: 3.64 MIL/uL — ABNORMAL LOW (ref 3.87–5.11)
RDW: 16.1 % — ABNORMAL HIGH (ref 11.5–15.5)
WBC: 18.7 10*3/uL — ABNORMAL HIGH (ref 4.0–10.5)
nRBC: 0.2 % (ref 0.0–0.2)

## 2024-05-25 LAB — BASIC METABOLIC PANEL WITH GFR
Anion gap: 15 (ref 5–15)
BUN: 26 mg/dL — ABNORMAL HIGH (ref 6–20)
CO2: 26 mmol/L (ref 22–32)
Calcium: 8.5 mg/dL — ABNORMAL LOW (ref 8.9–10.3)
Chloride: 99 mmol/L (ref 98–111)
Creatinine, Ser: 4.71 mg/dL — ABNORMAL HIGH (ref 0.44–1.00)
GFR, Estimated: 12 mL/min — ABNORMAL LOW
Glucose, Bld: 21 mg/dL — CL (ref 70–99)
Potassium: 4.3 mmol/L (ref 3.5–5.1)
Sodium: 139 mmol/L (ref 135–145)

## 2024-05-25 LAB — CBG MONITORING, ED
Glucose-Capillary: 102 mg/dL — ABNORMAL HIGH (ref 70–99)
Glucose-Capillary: 103 mg/dL — ABNORMAL HIGH (ref 70–99)
Glucose-Capillary: 107 mg/dL — ABNORMAL HIGH (ref 70–99)
Glucose-Capillary: 118 mg/dL — ABNORMAL HIGH (ref 70–99)
Glucose-Capillary: 14 mg/dL — CL (ref 70–99)
Glucose-Capillary: 17 mg/dL — CL (ref 70–99)
Glucose-Capillary: 172 mg/dL — ABNORMAL HIGH (ref 70–99)
Glucose-Capillary: 256 mg/dL — ABNORMAL HIGH (ref 70–99)
Glucose-Capillary: 48 mg/dL — ABNORMAL LOW (ref 70–99)
Glucose-Capillary: 68 mg/dL — ABNORMAL LOW (ref 70–99)
Glucose-Capillary: 71 mg/dL (ref 70–99)
Glucose-Capillary: 71 mg/dL (ref 70–99)
Glucose-Capillary: 85 mg/dL (ref 70–99)

## 2024-05-25 LAB — GLUCOSE, CAPILLARY
Glucose-Capillary: 138 mg/dL — ABNORMAL HIGH (ref 70–99)
Glucose-Capillary: 75 mg/dL (ref 70–99)
Glucose-Capillary: 70 mg/dL (ref 70–99)
Glucose-Capillary: 86 mg/dL (ref 70–99)
Glucose-Capillary: 115 mg/dL — ABNORMAL HIGH (ref 70–99)

## 2024-05-25 LAB — COMPREHENSIVE METABOLIC PANEL WITH GFR
ALT: 66 U/L — ABNORMAL HIGH (ref 0–44)
AST: 113 U/L — ABNORMAL HIGH (ref 15–41)
Albumin: 4 g/dL (ref 3.5–5.0)
Alkaline Phosphatase: 577 U/L — ABNORMAL HIGH (ref 38–126)
Anion gap: 19 — ABNORMAL HIGH (ref 5–15)
BUN: 25 mg/dL — ABNORMAL HIGH (ref 6–20)
CO2: 22 mmol/L (ref 22–32)
Calcium: 8.6 mg/dL — ABNORMAL LOW (ref 8.9–10.3)
Chloride: 97 mmol/L — ABNORMAL LOW (ref 98–111)
Creatinine, Ser: 4.67 mg/dL — ABNORMAL HIGH (ref 0.44–1.00)
GFR, Estimated: 12 mL/min — ABNORMAL LOW
Glucose, Bld: 146 mg/dL — ABNORMAL HIGH (ref 70–99)
Potassium: 4 mmol/L (ref 3.5–5.1)
Sodium: 137 mmol/L (ref 135–145)
Total Bilirubin: 0.5 mg/dL (ref 0.0–1.2)
Total Protein: 6.2 g/dL — ABNORMAL LOW (ref 6.5–8.1)

## 2024-05-25 LAB — PATHOLOGIST SMEAR REVIEW

## 2024-05-25 LAB — PROTIME-INR
INR: 1.2 (ref 0.8–1.2)
Prothrombin Time: 16.2 s — ABNORMAL HIGH (ref 11.4–15.2)

## 2024-05-25 LAB — PHOSPHORUS: Phosphorus: 4.5 mg/dL (ref 2.5–4.6)

## 2024-05-25 LAB — BETA-HYDROXYBUTYRIC ACID
Beta-Hydroxybutyric Acid: 0.06 mmol/L (ref 0.05–0.27)
Beta-Hydroxybutyric Acid: 0.05 mmol/L (ref 0.05–0.27)

## 2024-05-25 LAB — LIPASE, BLOOD: Lipase: 16 U/L (ref 11–51)

## 2024-05-25 LAB — APTT: aPTT: 25 s (ref 24–36)

## 2024-05-25 LAB — MAGNESIUM: Magnesium: 2.1 mg/dL (ref 1.7–2.4)

## 2024-05-25 LAB — PRO BRAIN NATRIURETIC PEPTIDE: Pro Brain Natriuretic Peptide: >35000 pg/mL — ABNORMAL HIGH

## 2024-05-25 MED ORDER — SODIUM CHLORIDE 0.9% FLUSH: 3.0000 mL | INTRAVENOUS | Status: DC | PRN

## 2024-05-25 MED ORDER — SODIUM CHLORIDE 0.9% FLUSH
3.0000 mL | Freq: Two times a day (BID) | INTRAVENOUS | Status: DC
  Administered 2024-05-25 – 2024-05-27 (×4): 3 mL via INTRAVENOUS

## 2024-05-25 MED ORDER — DEXTROSE 50 % IV SOLN
INTRAVENOUS | Status: AC
  Administered 2024-05-25: 50 mL
  Filled 2024-05-25: qty 50

## 2024-05-25 MED ORDER — SODIUM CHLORIDE 0.9 % IV SOLN: 250.0000 mL | INTRAVENOUS | Status: AC | PRN

## 2024-05-25 MED ORDER — INSULIN ASPART 100 UNIT/ML IJ SOLN
2.0000 [IU] | Freq: Three times a day (TID) | INTRAMUSCULAR | Status: DC
  Administered 2024-05-25: 2 [IU] via SUBCUTANEOUS
  Filled 2024-05-25: qty 2

## 2024-05-25 MED ORDER — INSULIN ASPART 100 UNIT/ML IJ SOLN
0.0000 [IU] | Freq: Three times a day (TID) | INTRAMUSCULAR | Status: DC
  Administered 2024-05-27: 4 [IU] via SUBCUTANEOUS
  Administered 2024-05-27: 2 [IU] via SUBCUTANEOUS
  Administered 2024-05-27: 1 [IU] via SUBCUTANEOUS
  Filled 2024-05-25: qty 1
  Filled 2024-05-25: qty 4
  Filled 2024-05-25: qty 2

## 2024-05-25 MED ORDER — INSULIN GLARGINE 100 UNIT/ML ~~LOC~~ SOLN
5.0000 [IU] | Freq: Every day | SUBCUTANEOUS | Status: DC
  Administered 2024-05-25 – 2024-05-26 (×2): 5 [IU] via SUBCUTANEOUS
  Filled 2024-05-25 (×4): qty 0.05

## 2024-05-25 MED ORDER — DEXTROSE 50 % IV SOLN
1.0000 | Freq: Once | INTRAVENOUS | Status: AC
  Administered 2024-05-25: 50 mL via INTRAVENOUS

## 2024-05-25 NOTE — ED Notes (Signed)
 Patient provided with OJ and encouraged to eat part of her breakfast. Patient eating grits when RN returned with OJ

## 2024-05-25 NOTE — ED Notes (Signed)
 CCMD Called

## 2024-05-25 NOTE — Progress Notes (Signed)
 " Progress Note   Patient: Ashley Freeman FMW:[id number] DOB: [birth year] DOA: 05/24/2024     0 DOS: the patient was seen and examined on 05/25/2024   Brief hospital course:   34 y.o. female with medical history significant for type 1 diabetes mellitus complicated by recurrent hospitalizations for DKA, chronic abdominal pain, end-stage renal disease on hemodialysis, bipolar disorder, chronic systolic/diastolic heart failure, seizure disorder, severe tricuspid regurgitation, anemia of chronic kidney disease with baseline hemoglobin 8-11, chronic transaminitis, who is admitted to [facility] on 05/24/2024 with DKA after presenting from home to [facility] ED complaining of abdominal pain.   Out of DKA now.  Has been started on insulin  regimen.  Diabetes coordinator on board.  She is from home and lives with her mother and daughter.  Patient is a hemodialysis patient, last hemodialysis was on Saturday.  Her schedule is Tuesday, Thursday and Saturdays.  She has a right arm graft in place for hemodialysis.  Assessment and Plan:  Diabetic ketoacidosis, POA: In the setting of poorly controlled insulin -dependent type 2 diabetes mellitus with most recent A1c of 8.5% on 03/30/2025.  Multiple prior hospitalizations for DKA.  Patient is on NovoLog  sliding scale 3 times daily with meals along with Lantus  5 units subcutaneous daily.  Upon initial arrival, blood glucose level was greater than 800 and she was in DKA.  She was started on insulin  drip as per DKA protocol. Out of DKA now Continue with sliding scale insulin , Lantus  5 units subcutaneous daily as well as aspart 2 units 3 times daily with meals.  Diabetes coordinator on board, appreciate assistance. Off of intravenous fluids.  Patient has been started on carb controlled/renal diet   ESRD, on hemodialysis.  Tuesday, Thursday, Saturday schedule.  Last hemodialysis was on Saturday.  Nephrology consulted for hemodialysis management.  Rest of  management as per nephrology  Bipolar disorder, POA: Continue with olanzapine  and Cymbalta .  No acute issues  Chronic systolic/diastolic heart failure, POA: Daily weight, strict intake of monitoring, heart healthy/renal diet, continue with Coreg  25 mg p.o. twice daily  Essential hypertension, POA: Continue with home Coreg .  Monitor blood pressure closely.  Continue with as needed IV labetalol  for systolic blood pressure greater than 180 or diastolic blood pressure greater than 120 mmHg.  Monitor on telemetry closely.  History of seizures, POA: Continue with Vimpat   Transaminitis, POA: No acute issues.  This is likely multifactorial in the setting of congestive hepatopathy in the setting of congestive heart failure/right-sided heart failure and severe TR with chronic ascites.  No acute issues.  Outpatient follow-up with GI if needed     Subjective: Patient is out of DKA now.  She has been started on carb controlled diet.  She wants to eat this morning.  She said that she was taking her insulin  as prescribed.  Physical Exam: Vitals:   05/25/24 0030 05/25/24 0100 05/25/24 0200 05/25/24 0656  BP: (!) 186/92  (!) 131/108 (!) 147/80  Pulse: 94  82 96  Resp: 12  (!) 24 19  Temp:  98.3 F (36.8 C)  98.3 F (36.8 C)  TempSrc:  Oral  Oral  SpO2: 96%  97% 98%  Weight:      Height:       Constitutional: NAD, calm, comfortable Eyes: PERRL, lids and conjunctivae normal ENMT: Mucous membranes are moist. Posterior pharynx clear of any exudate or lesions.Normal dentition.  Neck: normal, supple, no masses, no thyromegaly Respiratory: clear to auscultation bilaterally, no wheezing, no crackles. Normal  respiratory effort. No accessory muscle use.  Cardiovascular: Regular rate and rhythm, no murmurs / rubs / gallops. No extremity edema. 2+ pedal pulses. No carotid bruits.  Abdomen: no tenderness, no masses palpated. No hepatosplenomegaly. Bowel sounds positive.  Musculoskeletal: no clubbing /  cyanosis. No joint deformity upper and lower extremities. Good ROM, no contractures. Normal muscle tone.  Skin: Right arm hemodialysis graft in place Neurologic: CN 2-12 grossly intact. Sensation intact, DTR normal. Strength 5/5 x all 4 extremities.   Data Reviewed:  There are no new results to review at this time.  Family Communication: None at the bedside  Disposition: Status is: Observation The patient remains OBS appropriate and will d/c before 2 midnights.  Planned Discharge Destination: Home    Time spent: 41 minutes  Author: Deliliah Room, MD 05/25/2024 9:55 AM  For on call review [email or web address].  "

## 2024-05-25 NOTE — Inpatient Diabetes Management (Signed)
 Inpatient Diabetes Program Recommendations  AACE/ADA: New Consensus Statement on Inpatient Glycemic Control (2015)  Target Ranges:  Prepandial:   less than 140 mg/dL      Peak postprandial:   less than 180 mg/dL (1-2 hours)      Critically ill patients:  140 - 180 mg/dL   Lab Results  Component Value Date   GLUCAP 102 (H) 05/25/2024   HGBA1C 8.5 (H) 03/30/2024    Review of Glycemic Control Glucose >600 mg/dL on admit Diabetes history: DM 1 Outpatient Diabetes medications:  Novolog  2 units tid with meals Lantus  5 units daily Current orders for Inpatient glycemic control:  Novolog  0-6 units tid with meals  Novolog  2 units tid with meals Lantus  5 units daily  Inpatient Diabetes Program Recommendations:    Note patient readmitted one day after discharge on 05/23/24.  ? If patient took insulin  once she got home.  This is her 17th admission in the past 6 months.   Agree with current orders.    Thanks,  Randall Bullocks, RN, BC-ADM Inpatient Diabetes Coordinator Pager [phone]  (8a-5p)

## 2024-05-25 NOTE — Progress Notes (Signed)
" ° °  Brief Progress Note   _____________________________________________________________________________________________________________  Patient Name: Ashley Freeman Patient DOB: 23-Aug-1990 Date: @TODAY @      Data: Reviewed labs, notes, VS.     Action: No action needed at this time.      Response:    _____________________________________________________________________________________________________________  [facility] RN Expeditor Sharolyn JONETTA Batman Please contact us  directly via secure chat (search for [facility]) or by calling us  at [phone] [facility]).  "

## 2024-05-25 NOTE — ED Notes (Signed)
 Patient CBG of 17, Brittney RN and this RN gave 25 mg 50% Dextrose  injection per standing Hypoglycemia orders. Deliliah, MD paged by nurse secretary

## 2024-05-25 NOTE — Care Plan (Signed)
 Per [facility] records pt admitted as of 05/24/24.   After a thorough review of the chart this patient does not meet criteria for a Transitions Call at this time. Reason for Disqualification : Currrently Inpatient. Please contact [phone] with any further questions.              Jordan Bryant, RN

## 2024-05-25 NOTE — ED Notes (Signed)
 Pt more aware, this RN encouraged pt to eat. Pt is eating lunch and drinking tea requesting pain meds.

## 2024-05-25 NOTE — ED Notes (Signed)
 Pt CBG 48, Brittany, RN made aware

## 2024-05-25 NOTE — ED Notes (Signed)
 Pt refused to eat food or drink , wants to be left alone CBG 17.

## 2024-05-25 NOTE — Progress Notes (Incomplete)
 SABRA

## 2024-05-26 LAB — BLOOD GAS, VENOUS
Acid-Base Excess: 3.3 mmol/L — ABNORMAL HIGH (ref 0.0–2.0)
Bicarbonate: 29.1 mmol/L — ABNORMAL HIGH (ref 20.0–28.0)
O2 Saturation: 61.9 %
Patient temperature: 36.4
pCO2, Ven: 47 mmHg (ref 44–60)
pH, Ven: 7.4 (ref 7.25–7.43)
pO2, Ven: 36 mmHg (ref 32–45)

## 2024-05-26 LAB — GLUCOSE, CAPILLARY
Glucose-Capillary: 70 mg/dL (ref 70–99)
Glucose-Capillary: 77 mg/dL (ref 70–99)
Glucose-Capillary: 41 mg/dL — CL (ref 70–99)
Glucose-Capillary: 141 mg/dL — ABNORMAL HIGH (ref 70–99)

## 2024-05-26 LAB — HEPATITIS B SURFACE ANTIGEN: Hepatitis B Surface Ag: NONREACTIVE

## 2024-05-26 MED ORDER — HEPARIN SODIUM (PORCINE) 1000 UNIT/ML DIALYSIS: 1000.0000 [IU] | INTRAMUSCULAR | Status: DC | PRN

## 2024-05-26 MED ORDER — DICYCLOMINE HCL 10 MG PO CAPS
10.0000 mg | ORAL_CAPSULE | Freq: Three times a day (TID) | ORAL | Status: DC | PRN
  Administered 2024-05-26: 10 mg via ORAL
  Filled 2024-05-26: qty 1

## 2024-05-26 MED ORDER — DICYCLOMINE HCL 10 MG PO CAPS
10.0000 mg | ORAL_CAPSULE | Freq: Three times a day (TID) | ORAL | Status: DC
  Administered 2024-05-26: 10 mg via ORAL
  Filled 2024-05-26 (×3): qty 1

## 2024-05-26 MED ORDER — PANTOPRAZOLE SODIUM 40 MG PO TBEC
40.0000 mg | DELAYED_RELEASE_TABLET | Freq: Every day | ORAL | Status: DC
  Administered 2024-05-26: 40 mg via ORAL
  Filled 2024-05-26: qty 1

## 2024-05-26 MED ORDER — LIDOCAINE-PRILOCAINE 2.5-2.5 % EX CREA: 1.0000 | TOPICAL_CREAM | CUTANEOUS | Status: DC | PRN

## 2024-05-26 MED ORDER — HEPARIN SODIUM (PORCINE) 1000 UNIT/ML DIALYSIS: 1600.0000 [IU] | Freq: Once | INTRAMUSCULAR | Status: DC

## 2024-05-26 MED ORDER — METHOCARBAMOL 500 MG PO TABS: 500.0000 mg | ORAL_TABLET | Freq: Four times a day (QID) | ORAL | 0 refills | Status: DC

## 2024-05-26 MED ORDER — LIDOCAINE HCL (PF) 1 % IJ SOLN: 5.0000 mL | INTRAMUSCULAR | Status: DC | PRN

## 2024-05-26 MED ORDER — DEXTROSE 50 % IV SOLN
50.0000 mL | Freq: Once | INTRAVENOUS | Status: AC
  Administered 2024-05-26: 50 mL via INTRAVENOUS

## 2024-05-26 MED ORDER — FAMOTIDINE 40 MG/5ML PO SUSR
20.0000 mg | Freq: Once | ORAL | Status: AC
  Administered 2024-05-26: 20 mg via ORAL
  Filled 2024-05-26: qty 2.5

## 2024-05-26 MED ORDER — PANTOPRAZOLE SODIUM 40 MG PO TBEC: 40.0000 mg | DELAYED_RELEASE_TABLET | Freq: Every day | ORAL | 0 refills | Status: AC

## 2024-05-26 MED ORDER — ALTEPLASE 2 MG IJ SOLR: 2.0000 mg | Freq: Once | INTRAMUSCULAR | Status: DC | PRN

## 2024-05-26 MED ORDER — CHLORHEXIDINE GLUCONATE CLOTH 2 % EX PADS
6.0000 | MEDICATED_PAD | Freq: Every day | CUTANEOUS | Status: DC
  Administered 2024-05-26 – 2024-05-27 (×2): 6 via TOPICAL

## 2024-05-26 MED ORDER — METOCLOPRAMIDE HCL 5 MG PO TABS: 5.0000 mg | ORAL_TABLET | Freq: Three times a day (TID) | ORAL | 0 refills | Status: AC | PRN

## 2024-05-26 MED ORDER — DEXTROSE 50 % IV SOLN: 50.0000 mL | Freq: Once | INTRAVENOUS | Status: AC

## 2024-05-26 MED ORDER — ANTICOAGULANT SODIUM CITRATE 4% (200MG/5ML) IV SOLN: 5.0000 mL | Status: DC | PRN

## 2024-05-26 MED ORDER — DEXTROSE 50 % IV SOLN
INTRAVENOUS | Status: AC
  Filled 2024-05-26: qty 50

## 2024-05-26 MED ORDER — HYDROCODONE-ACETAMINOPHEN 5-325 MG PO TABS
1.0000 | ORAL_TABLET | Freq: Four times a day (QID) | ORAL | Status: DC | PRN
  Administered 2024-05-26 – 2024-05-27 (×5): 1 via ORAL
  Filled 2024-05-26 (×5): qty 1

## 2024-05-26 MED ORDER — DICYCLOMINE HCL 10 MG PO CAPS: 10.0000 mg | ORAL_CAPSULE | Freq: Three times a day (TID) | ORAL | 0 refills | Status: DC

## 2024-05-26 MED ORDER — PENTAFLUOROPROP-TETRAFLUOROETH EX AERO: 1.0000 | INHALATION_SPRAY | CUTANEOUS | Status: DC | PRN

## 2024-05-26 MED ORDER — DICYCLOMINE HCL 10 MG PO CAPS: 10.0000 mg | ORAL_CAPSULE | Freq: Three times a day (TID) | ORAL | 0 refills | Status: AC | PRN

## 2024-05-26 MED ORDER — CARVEDILOL 25 MG PO TABS: 25.0000 mg | ORAL_TABLET | Freq: Two times a day (BID) | ORAL | 0 refills | Status: AC

## 2024-05-26 NOTE — Progress Notes (Signed)
" °   05/26/24 1756  Vitals  Temp 97.8 F (36.6 C)  Pulse Rate 78  Resp (!) 25  BP (!) 160/93  SpO2 99 %  O2 Device Room Air  Type of Weight Post-Dialysis  Oxygen Therapy  Patient Activity (if Appropriate) In bed  Pulse Oximetry Type Continuous  Oximetry Probe Site Changed No  Post Treatment  Dialyzer Clearance Clear  Liters Processed 90  Fluid Removed (mL) 3000 mL  Tolerated HD Treatment Yes  Post-Hemodialysis Comments Pt. tolerated tx well  AVG/AVF Arterial Site Held (minutes) 7 minutes  AVG/AVF Venous Site Held (minutes) 7 minutes    "

## 2024-05-26 NOTE — Progress Notes (Signed)
 [facility] PROGRESS NOTE Patient: ADALAE BAYSINGER FMW:[id number]   DOA: 05/24/2024   DOS: 05/26/2024   Subjective: Blood sugar low.  Likely patient missed her meals during the hemodialysis.  Objective:  Vitals:   05/26/24 1700 05/26/24 1730 05/26/24 1755 05/26/24 1756  BP: (!) 158/49 (!) 130/97 (!) 150/89 (!) 160/93  Pulse: 74 75 79 78  Resp: (!) 24 (!) 21 (!) 23 (!) 25  Temp:    97.8 F (36.6 C)  TempSrc:      SpO2: 100% 100% 100% 99%  Weight:      Height:        Data: CBG 41. Assessment and plan: Will hold discharge and monitor overnight.  Author: Yetta Blanch, MD [facility] 05/26/2024 6:58 PM  Between 7PM-7AM, please contact night-coverage listed at [email or web address]

## 2024-05-26 NOTE — Progress Notes (Addendum)
 Contacted by social work. Per social work, pt is requesting to change her HD clinic from [facility] to the new [facility] on [address]. She states this is closer to her home and will help with her transportation issues. Navigator has reached out to clinic manager at the [facility] on [address], awaiting call back to see if pt would be eligible. Will update as possible.   Lavanda Naviah [facility] Navigator [phone]  Contacted by clinic manager at [facility] [address]. Per clinic manager they have talked with this pt before about switching and she has been denied recently due to insurance. This new clinic does not take her insurance, and is unable to transfer her. Social work informed.

## 2024-05-26 NOTE — Progress Notes (Signed)
 [facility] Progress Note   OP HD: TTS [facility] ([address] 3h  B400  57.5kg  2K bath  R AVG  Hep 1600  Assessment/ Plan:  # ESRD - on HD TTS in [place] - recently admitted at [facility] with last dialysis Monday  - chronically vol overloaded - had HD here Sat  - next HD today      # HTN - BP's up 140s/ 90s - getting coreg  po     # Volume - tr  LE edema - up by wts -> usually 8-12 kg over, chronic issue - UF 3 L w/ HD today; very [place]     # Anemia of esrd - will check iron  stores; transfuse if Hb <7    # DKA  - per pmd   Subjective:  Pt seen in room, wants to eat; asking for potatoes with ketchup No c/o's today, due for HD today  Presentation summary: 34 y.o. female with history of uncontrolled diabetes followed by endocrinology recently admitted to [facility] for DKA and just discharged with last dialysis treatment on Monday which was a shortened treatment because of plan for discharge.  Patient now back and missed dialysis because of transportation issues, has not been eating which led her to not use her insulin  now presenting with abdominal pain and nausea.  She denies any fever, chills, shortness of breath but does have diffuse edema; she knows she is well up on her dry weight.   Vitals:   05/25/24 2350 05/26/24 0335 05/26/24 0345 05/26/24 0400  BP: 135/78 110/83    Pulse: 70 81    Resp: 14 (!) 28 (!) 21 17  Temp: 97.6 F (36.4 C) (!) 97.4 F (36.3 C)    TempSrc: Oral Oral    SpO2:      Weight:      Height:        Exam: Gen alert & oriented x3 No jvd or bruits Chest CTA b/l RRR no MRG Abd soft ntnd no mass, 1-2+ ascites +bs Ext tr pitting edema LEs Neuro moving all 4 ext RUE AVG+ bruit, no sig hematoma   Home bp meds: Coreg   Bumex  1 mg daily      Recent Labs  Lab 05/23/24 0500 05/24/24 1748 05/24/24 1749 05/25/24 0301 05/25/24 1237  HGB 8.3*   < > 9.9* 9.7*  --   ALBUMIN  3.2*  --  3.9 4.0  --   CALCIUM  8.0*  --   8.2* 8.6* 8.5*  PHOS 5.6*  --   --  4.5  --   CREATININE 5.11*  --  4.50* 4.67* 4.71*  K 4.6   < > 5.3* 4.0 4.3   < > = values in this interval not displayed.   No results for input(s): IRON , TIBC, FERRITIN in the last 168 hours. Inpatient medications:  carvedilol   25 mg Oral BID WC   DULoxetine   20 mg Oral Daily   insulin  aspart  0-6 Units Subcutaneous TID WC   insulin  glargine  5 Units Subcutaneous Daily   lacosamide   100 mg Oral BID   OLANZapine   5 mg Oral QHS   pantoprazole  (PROTONIX ) IV  40 mg Intravenous Q24H   sevelamer  carbonate  800 mg Oral TID WC   sodium chloride  flush  3 mL Intravenous Q12H    sodium chloride       sodium chloride , acetaminophen  **OR** acetaminophen , HYDROcodone -acetaminophen , labetalol , melatonin, metoCLOPramide  (REGLAN ) injection, naLOXone  (NARCAN )  injection, prochlorperazine , sodium chloride  flush

## 2024-05-26 NOTE — Discharge Summary (Addendum)
 " Physician Discharge Summary   Patient: Ashley Freeman MRN: [id number] DOB: 1991/02/15  Admit date:     05/24/2024  Discharge date: 05/26/24  Discharge Physician: Yetta Blanch  PCP: Keven Crumbly Pap, MD  Disposition: Home Recommendations at discharge: Follow-up with PCP. CBC BMP in 1 week. Patient would benefit from referral to a pain clinic.   Follow-up Information     [name], [name], MD. Schedule an appointment as soon as possible for a visit in 1 week(s).   Specialty: Family Medicine Why: with BMP lab to look at kidney/electrolyte numbers, with CBC lab to look at blood counts. will need a referral to pain clinic. Contact information: [address] [place] [address] [phone]                Hospital Course: Patient with PMH of ESRD on HD, T1DM, bipolar disorder, multiple hospitalization for DKA as well as missing hemodialysis, to the hospital again with complaints of nausea vomiting and abdominal pain. Found to have DKA. Was also severely volume overloaded require hemodialysis.  Type 1 diabetes mellitus, uncontrolled with hyperglycemia with DKA. Severe DKA. Treated with IV insulin . Currently DKA is resolved. On basal bolus regimen. Actually had hypoglycemic spells as well. Her beta-hydroxybutyrate acid actually has been on a normal side and therefore the anion gap could also be secondary to her need for hemodialysis and therefore patient was switched to basal bolus regimen. Patient is now tolerating oral diet. Anticipating stabilization of the sugar. Will resume home regimen.  ESRD on HD. Missing hemodialysis secondary to recurrent DKA episode. Question compliance with diabetes regimen. Appreciate nephrology assistance. Patient underwent back-to-back hemodialysis. Volume status appears to be adequate.  Intractable nausea and vomiting. Recurrent abdominal pain. CT scan of the abdomen performed on 1/21 negative for any acute  intra-abdominal pathology. Currently tolerating oral diet. Has good bowel sound. Abdominal ultrasound on exam but Passing gas. Recommending to add Bentyl  as needed. As well as Reglan  as needed. Patient has multiple hospitalization with abdominal pain. Requires multiple rounds of IV and oral pain medication. Also has been receiving prescription pain medication from hospital providers frequently. But has not established care with anybody who can control her pain outpatient. Recommend outpatient pain clinic management.  History of seizure disorder. Continue Vimpat  upon discharge.  HTN. On Coreg . Will continue for now.  Bipolar disorder. Outpatient follow-up with psychiatry recommended.  Anemia chronic kidney disease. Management per nephrology. H&H stable.  Consultants:  Nephrology  Procedures performed:  Hemodialysis  DISCHARGE MEDICATION: Allergies as of 05/26/2024       Reactions   Keflex  [cephalexin ] Anaphylaxis   Ceftriaxone  in the past with no reaction   Penicillins Anaphylaxis, Hives, Rash   Vibramycin  [doxycycline ] Anaphylaxis   Benadryl  [diphenhydramine ] Itching   Entresto  [sacubitril -valsartan ] Swelling   angioedema   Dilaudid  [hydromorphone ] Itching   Losartan  Dermatitis   Methotrexate And Trimetrexate Rash   Roxicodone  [oxycodone ] Itching   Takes Percocet without issue        Medication List     STOP taking these medications    prochlorperazine  5 MG tablet Commonly known as: COMPAZINE        TAKE these medications    albuterol  108 (90 Base) MCG/ACT inhaler Commonly known as: VENTOLIN  HFA Inhale 2 puffs into the lungs every 4 (four) hours as needed for wheezing or shortness of breath.   bumetanide  2 MG tablet Commonly known as: BUMEX  Take 10 mg by mouth daily.   carvedilol  25 MG tablet Commonly known  as: COREG  Take 1 tablet (25 mg total) by mouth 2 (two) times daily with a meal. Follow with your PCP for refills.   Cinnamon 500 MG  capsule Take 500 mg by mouth in the morning.   dicyclomine  10 MG capsule Commonly known as: BENTYL  Take 1 capsule (10 mg total) by mouth every 8 (eight) hours as needed for spasms.   DULoxetine  20 MG capsule Commonly known as: CYMBALTA  Take 1 capsule (20 mg total) by mouth daily.   ELDERBERRY IMMUNE HEALTH GUMMY PO Take 1 tablet by mouth daily.   fluticasone  50 MCG/ACT nasal spray Commonly known as: FLONASE  Place 2 sprays into both nostrils daily as needed for allergies or rhinitis.   hydrOXYzine  25 MG tablet Commonly known as: ATARAX  Take 1 tablet (25 mg total) by mouth 3 (three) times daily as needed for anxiety, itching, nausea or vomiting.   insulin  aspart 100 UNIT/ML FlexPen Commonly known as: NOVOLOG  Inject 2 Units into the skin 3 (three) times daily with meals.   lacosamide  50 MG Tabs tablet Commonly known as: VIMPAT  Take 2 tablets (100 mg total) by mouth 2 (two) times daily. Take additional 1 tablet (50 mg) after dialysis on Tuesdays, Thursdays and Saturdays   Lantus  SoloStar 100 UNIT/ML Solostar Pen Generic drug: insulin  glargine Inject 5 Units into the skin daily.   methocarbamol  500 MG tablet Commonly known as: ROBAXIN  Take 1 tablet (500 mg total) by mouth 4 (four) times daily for 5 days.   metoCLOPramide  5 MG tablet Commonly known as: Reglan  Take 1 tablet (5 mg total) by mouth every 8 (eight) hours as needed for nausea.   OLANZapine  5 MG tablet Commonly known as: ZYPREXA  Take 1 tablet (5 mg total) by mouth at bedtime.   Pancrelipase  (Lip-Prot-Amyl) 3000-9500 units Cpep Take 3,000 Units by mouth in the morning, at noon, and at bedtime.   pantoprazole  40 MG tablet Commonly known as: PROTONIX  Take 1 tablet (40 mg total) by mouth at bedtime.   sevelamer  carbonate 800 MG tablet Commonly known as: RENVELA  Take 1 tablet (800 mg total) by mouth 3 (three) times daily with meals.   SUMAtriptan  50 MG tablet Commonly known as: IMITREX  Take 50 mg by mouth  every 2 (two) hours as needed for migraine or headache.   Valtoco  15 MG Dose 2 x 7.5 MG/0.1ML Lqpk Generic drug: diazePAM  (15 MG Dose) Place 15 mg into the nose as needed (sz lasting over 2 minutes).   Veltassa  16.8 g Pack Generic drug: Patiromer  Sorbitex Calcium  Take 1 packet by mouth daily at 12 noon.        Diet recommendation: Carb modified diet  Discharge Exam: Vitals:   05/26/24 1700 05/26/24 1730 05/26/24 1755 05/26/24 1756  BP: (!) 158/49 (!) 130/97 (!) 150/89 (!) 160/93  Pulse: 74 75 79 78  Resp: (!) 24 (!) 21 (!) 23 (!) 25  Temp:    97.8 F (36.6 C)  TempSrc:      SpO2: 100% 100% 100% 99%  Weight:      Height:        Filed Weights   05/24/24 1623 05/26/24 1348  Weight: 64 kg 69.2 kg   General: in Mild distress, No Rash Cardiovascular: S1 and S2 Present, No Murmur Respiratory: Good respiratory effort, Bilateral Air entry present. No Crackles, No wheezes Abdomen: Bowel Sound present, No tenderness Extremities: No edema Neuro: Alert and oriented x3, no new focal deficit  Condition at discharge: stable  The results of significant diagnostics from this  hospitalization (including imaging, microbiology, ancillary and laboratory) are listed below for reference.   Imaging Studies: DG Chest Port 1 View Result Date: 05/24/2024 EXAM: 1 VIEW XRAY OF THE CHEST 05/24/2024 07:46:41 PM COMPARISON: 05/20/2024 CLINICAL HISTORY: Upper abdominal pain. FINDINGS: LINES, TUBES AND DEVICES: Overlying wires noted. LUNGS AND PLEURA: Left perihilar opacities. Low lung volumes. No pleural effusion. No pneumothorax. HEART AND MEDIASTINUM: Cardiomegaly. BONES AND SOFT TISSUES: No acute osseous abnormality. IMPRESSION: 1. Left perihilar opacities. Electronically signed by: Elsie Gravely MD 05/24/2024 07:50 PM EST RP Workstation: [id number]   CT ABDOMEN PELVIS WO CONTRAST Result Date: 05/20/2024 EXAM: CT ABDOMEN AND PELVIS WITHOUT CONTRAST 05/20/2024 05:21:43 PM TECHNIQUE: CT of the  abdomen and pelvis was performed without the administration of intravenous contrast. Multiplanar reformatted images are provided for review. Automated exposure control, iterative reconstruction, and/or weight-based adjustment of the mA/kV was utilized to reduce the radiation dose to as low as reasonably achievable. COMPARISON: 03/08/2024 CLINICAL HISTORY: Abdominal pain, acute, nonlocalized; ab pain r/o SBO. Patient has hx of ESRD on HD. FINDINGS: LOWER CHEST: Small right and trace left pleural effusions. Patchy ground glass opacities in the lung bases, possibly atelectasis or developing edema. LIVER: The liver is unremarkable. GALLBLADDER AND BILE DUCTS: Cholecystectomy. No biliary ductal dilatation. SPLEEN: No acute abnormality. PANCREAS: No acute abnormality. ADRENAL GLANDS: No acute abnormality. KIDNEYS, URETERS AND BLADDER: Renal cortical atrophy bilaterally, consistent with chronic medical renal disease. No stones in the kidneys or ureters. No hydronephrosis. No perinephric or periureteral stranding. Plate decompression of the urinary bladder. GI AND BOWEL: Stomach demonstrates no acute abnormality. Gas filled nondistended appendix. There is no bowel obstruction. PERITONEUM AND RETROPERITONEUM: Large volume ascites. No free air. VASCULATURE: Aorta is normal in caliber. Diffuse [place] calcification throughout the mesenteric vessels. LYMPH NODES: No lymphadenopathy. REPRODUCTIVE ORGANS: The uterus and ovaries are within normal limits for patients age. BONES AND SOFT TISSUES: No acute osseous abnormality. Severe, diffuse anasarca. IMPRESSION: 1. No acute intraabdominal or pelvic abnormality. 2. Severe, diffuse anasarca and large volume ascites with small right and trace left pleural effusions, likely related to the patient's volume status. 3. Patchy ground glass opacities in the lung bases, possibly representing atelectasis or developing pulmonary edema. Electronically signed by: Rogelia Myers MD 05/20/2024  05:33 PM EST RP Workstation: [id number]   DG Chest Port 1 View Result Date: 05/20/2024 EXAM: 1 VIEW(S) XRAY OF THE CHEST 05/20/2024 05:02:04 PM COMPARISON: 05/12/2024 CLINICAL HISTORY: SOB SOB SOB SOB SOB FINDINGS: LINES, TUBES AND DEVICES: Cardiac leads in place. LUNGS AND PLEURA: Underinflation. No focal pulmonary opacity. No pleural effusion. No pneumothorax. HEART AND MEDIASTINUM: Cardiomegaly. BONES AND SOFT TISSUES: No acute osseous abnormality. IMPRESSION: 1. Cardiomegaly. 2. No acute findings. Electronically signed by: Greig Pique MD 05/20/2024 05:10 PM EST RP Workstation: [id number]   DG Chest Port 1 View Result Date: 05/12/2024 EXAM: 1 VIEW(S) XRAY OF THE CHEST 05/12/2024 12:16:48 AM COMPARISON: 05/06/2024 CLINICAL HISTORY: Hyperglycemia FINDINGS: LUNGS AND PLEURA: Low lung volumes. No focal pulmonary opacity. No pleural effusion. No pneumothorax. HEART AND MEDIASTINUM: Cardiomegaly. BONES AND SOFT TISSUES: No acute osseous abnormality. IMPRESSION: 1. Cardiomegaly. Electronically signed by: Oneil Devonshire MD MD 05/12/2024 12:25 AM EST RP Workstation: [id number] View Result Date: 05/07/2024 EXAM: 1 VIEW XRAY OF THE ABDOMEN 05/07/2024 05:44:00 PM COMPARISON: 05/06/2024 CLINICAL HISTORY: Ileus (HCC) FINDINGS: BOWEL: Gas filled loops of nondilated small bowel with scattered gas in the cecum and transverse colon. This appearance is nonobstructive but raises the possibility adynamic small bowel ileus or  enteritis. SOFT TISSUES: Cholecystectomy clips present. No abnormal calcifications. BONES: No acute fracture. IMPRESSION: 1. Possible adynamic ileus or enteritis. Electronically signed by: Pinkie Pebbles MD MD 05/07/2024 07:31 PM EST RP Workstation: [id number]   CT HEAD WO CONTRAST ( ) Result Date: 05/07/2024 EXAM: CT HEAD WITHOUT CONTRAST 05/07/2024 07:24:03 PM TECHNIQUE: CT of the head was performed without the administration of intravenous contrast. Automated exposure control, iterative  reconstruction, and/or weight based adjustment of the mA/kV was utilized to reduce the radiation dose to as low as reasonably achievable. COMPARISON: 04/13/2024 CLINICAL HISTORY: Mental status change, unknown cause. FINDINGS: BRAIN AND VENTRICLES: No acute hemorrhage. No evidence of acute infarct. No hydrocephalus. No extra-axial collection. No mass effect or midline shift. Intracranial atherosclerosis. ORBITS: No acute abnormality. SINUSES: No acute abnormality. SOFT TISSUES AND SKULL: No acute soft tissue abnormality. No skull fracture. IMPRESSION: 1. No acute intracranial abnormality. Electronically signed by: Pinkie Pebbles MD MD 05/07/2024 07:27 PM EST RP Workstation: [id number]   DG Abd 1 View Result Date: 05/06/2024 CLINICAL DATA:  Abdominal pain EXAM: ABDOMEN - 1 VIEW COMPARISON:  04/23/2024 FINDINGS: Mild gaseous distention of the stomach. No gaseous small bowel dilatation to suggest obstruction. Gas is visible in a nondilated splenic flexure of the colon. Surgical clips in the right upper quadrant suggest prior cholecystectomy. IMPRESSION: Mild gaseous distention of the stomach. No gaseous small bowel dilatation to suggest obstruction. Electronically Signed   By: Camellia Candle M.D.   On: 05/06/2024 06:53   DG Chest Port 1 View Result Date: 05/06/2024 CLINICAL DATA:  Cough EXAM: PORTABLE CHEST 1 VIEW COMPARISON:  04/30/2024, 04/24/2024 FINDINGS: Cardiomegaly. No focal airspace disease, pleural effusion or pneumothorax. Decreased vascular congestion and interstitial edema compared to prior. IMPRESSION: Cardiomegaly with decreased vascular congestion and interstitial edema compared to prior. Electronically Signed   By: Luke Bun M.D.   On: 05/06/2024 00:18   DG Chest Port 1 View Result Date: 04/30/2024 EXAM: 1 VIEW(S) XRAY OF THE CHEST 04/30/2024 08:32:51 PM COMPARISON: 04/24/2024 CLINICAL HISTORY: SOB FINDINGS: LUNGS AND PLEURA: Low lung volumes. Perihilar vascular fullness with bilateral  interstitial prominence. Decreased retrocardiac opacity. Trace right pleural effusion. No focal pulmonary opacity. No pneumothorax. HEART AND MEDIASTINUM: Unchanged cardiomegaly. BONES AND SOFT TISSUES: No acute osseous abnormality. IMPRESSION: 1. Mild pulmonary vascular congestion and interstitial edema. 2. Trace right pleural effusion. 3. Unchanged cardiomegaly. Electronically signed by: Greig Pique MD 04/30/2024 08:38 PM EST RP Workstation: [id number]    Microbiology: Results for orders placed or performed during the hospital encounter of 05/05/24  Respiratory (~20 pathogens) panel by PCR     Status: None   Collection Time: 05/06/24  1:46 PM   Specimen: Nasopharyngeal Swab; Respiratory  Result Value Ref Range Status   Adenovirus NOT DETECTED NOT DETECTED Final   Coronavirus 229E NOT DETECTED NOT DETECTED Final    Comment: (NOTE) The Coronavirus on the Respiratory Panel, DOES NOT test for the novel  Coronavirus (2019 nCoV)    Coronavirus HKU1 NOT DETECTED NOT DETECTED Final   Coronavirus NL63 NOT DETECTED NOT DETECTED Final   Coronavirus OC43 NOT DETECTED NOT DETECTED Final   Metapneumovirus NOT DETECTED NOT DETECTED Final   Rhinovirus / Enterovirus NOT DETECTED NOT DETECTED Final   Influenza A NOT DETECTED NOT DETECTED Final   Influenza B NOT DETECTED NOT DETECTED Final   Parainfluenza Virus 1 NOT DETECTED NOT DETECTED Final   Parainfluenza Virus 2 NOT DETECTED NOT DETECTED Final   Parainfluenza Virus 3 NOT DETECTED NOT DETECTED Final   Parainfluenza  Virus 4 NOT DETECTED NOT DETECTED Final   Respiratory Syncytial Virus NOT DETECTED NOT DETECTED Final   Bordetella pertussis NOT DETECTED NOT DETECTED Final   Bordetella Parapertussis NOT DETECTED NOT DETECTED Final   Chlamydophila pneumoniae NOT DETECTED NOT DETECTED Final   Mycoplasma pneumoniae NOT DETECTED NOT DETECTED Final    Comment: Performed at [facility], [address], [place], [place] [address]  SARS Coronavirus 2  by RT PCR (hospital order, performed in [facility] hospital lab) *cepheid single result test* Nasopharyngeal Swab     Status: None   Collection Time: 05/06/24  1:46 PM   Specimen: Nasopharyngeal Swab; Nasal Swab  Result Value Ref Range Status   SARS Coronavirus 2 by RT PCR NEGATIVE NEGATIVE Final    Comment: Performed at [facility], [address], [place], [place] [address]  Culture, blood (Routine X 2) w Reflex to ID Panel     Status: None   Collection Time: 05/07/24  4:44 PM   Specimen: BLOOD  Result Value Ref Range Status   Specimen Description BLOOD SITE NOT SPECIFIED  Final   Special Requests   Final    BOTTLES DRAWN AEROBIC AND ANAEROBIC Blood Culture adequate volume   Culture   Final    NO GROWTH 5 DAYS Performed at [facility], [address] [place], [place] [address]    Report Status 05/12/2024 FINAL  Final  Culture, blood (Routine X 2) w Reflex to ID Panel     Status: None   Collection Time: 05/07/24  4:44 PM   Specimen: BLOOD  Result Value Ref Range Status   Specimen Description BLOOD SITE NOT SPECIFIED  Final   Special Requests   Final    BOTTLES DRAWN AEROBIC ONLY Blood Culture adequate volume   Culture   Final    NO GROWTH 5 DAYS Performed at [facility], [address], [place], [place] [address]    Report Status 05/12/2024 FINAL  Final   *Note: Due to a large number of results and/or encounters for the requested time period, some results have not been displayed. A complete set of results can be found in Results Review.   Labs: CBC: Recent Labs  Lab 05/20/24 1632 05/20/24 1707 05/20/24 2038 05/23/24 0500 05/24/24 1748 05/24/24 1749 05/25/24 0301  WBC 10.4  --  11.3* 9.8  --  9.6 18.7*  NEUTROABS 5.4  --   --   --   --  3.9 6.0  HGB 8.4*   < > 8.2* 8.3* 10.9* 9.9* 9.7*  HCT 29.1*   < > 27.4* 26.6* 32.0* 33.9* 32.4*  MCV 92.4  --  88.1 85.3  --  91.4 89.0  PLT 300  --  305 239  --  230 233   < > = values in this interval not  displayed.   Basic Metabolic Panel: Recent Labs  Lab 05/22/24 0550 05/23/24 0500 05/24/24 1748 05/24/24 1749 05/24/24 2237 05/25/24 0301 05/25/24 1237  NA 136 139 132* 133*  --  137 139  K 3.9 4.6 4.9 5.3*  --  4.0 4.3  CL 98 103  --  94*  --  97* 99  CO2 25 25  --  23  --  22 26  GLUCOSE 186* 99  --  822*  --  146* 21*  BUN 24* 28*  --  23*  --  25* 26*  CREATININE 4.30* 5.11*  --  4.50*  --  4.67* 4.71*  CALCIUM  7.7* 8.0*  --  8.2*  --  8.6* 8.5*  MG  --   --   --   --  2.0 2.1  --   PHOS 3.9 5.6*  --   --   --  4.5  --    Liver Function Tests: Recent Labs  Lab 05/22/24 0550 05/23/24 0500 05/24/24 1749 05/25/24 0301  AST  --   --  133* 113*  ALT  --   --  63* 66*  ALKPHOS  --   --  568* 577*  BILITOT  --   --  0.5 0.5  PROT  --   --  6.0* 6.2*  ALBUMIN  3.2* 3.2* 3.9 4.0   CBG: Recent Labs  Lab 05/25/24 1741 05/25/24 1842 05/25/24 2051 05/26/24 0752 05/26/24 1157  GLUCAP 70 86 115* 70 77    Discharge time spent: 35 minutes  Author: Yetta Blanch, MD  [facility]    "

## 2024-05-26 NOTE — Progress Notes (Signed)
 Pt. Came in asleep but alert when asked  with no complaints. Consent verified and on file. Started with no complaints  UF removal: Tx duration: 3.75 hours  Access used: Right AVG Access issue: None  Tx completed and tolerated Pressure dressing applied. Endorsed to floor nurse Transported to room.  Homer Miller Rubi Koven Belinsky, RN Kidney Care Unit

## 2024-05-26 NOTE — Plan of Care (Signed)
  Problem: Education: Goal: Ability to describe self-care measures that may prevent or decrease complications (Diabetes Survival Skills Education) will improve Outcome: Progressing Goal: Individualized Educational Video(s) Outcome: Progressing   Problem: Coping: Goal: Ability to adjust to condition or change in health will improve Outcome: Progressing   Problem: Fluid Volume: Goal: Ability to maintain a balanced intake and output will improve Outcome: Progressing   Problem: Health Behavior/Discharge Planning: Goal: Ability to identify and utilize available resources and services will improve Outcome: Progressing Goal: Ability to manage health-related needs will improve Outcome: Progressing   Problem: Metabolic: Goal: Ability to maintain appropriate glucose levels will improve Outcome: Progressing   Problem: Nutritional: Goal: Maintenance of adequate nutrition will improve Outcome: Progressing Goal: Progress toward achieving an optimal weight will improve Outcome: Progressing   Problem: Skin Integrity: Goal: Risk for impaired skin integrity will decrease Outcome: Progressing   Problem: Tissue Perfusion: Goal: Adequacy of tissue perfusion will improve Outcome: Progressing   Problem: Education: Goal: Knowledge of General Education information will improve Description: Including pain rating scale, medication(s)/side effects and non-pharmacologic comfort measures Outcome: Progressing   Problem: Health Behavior/Discharge Planning: Goal: Ability to manage health-related needs will improve Outcome: Progressing   Problem: Clinical Measurements: Goal: Ability to maintain clinical measurements within normal limits will improve Outcome: Progressing Goal: Will remain free from infection Outcome: Progressing Goal: Diagnostic test results will improve Outcome: Progressing Goal: Respiratory complications will improve Outcome: Progressing Goal: Cardiovascular complication will  be avoided Outcome: Progressing   Problem: Nutrition: Goal: Adequate nutrition will be maintained Outcome: Progressing   Problem: Coping: Goal: Level of anxiety will decrease Outcome: Progressing   Problem: Elimination: Goal: Will not experience complications related to bowel motility Outcome: Progressing Goal: Will not experience complications related to urinary retention Outcome: Progressing   Problem: Pain Managment: Goal: General experience of comfort will improve and/or be controlled Outcome: Progressing   Problem: Safety: Goal: Ability to remain free from injury will improve Outcome: Progressing   Problem: Skin Integrity: Goal: Risk for impaired skin integrity will decrease Outcome: Progressing

## 2024-05-26 NOTE — TOC Initial Note (Signed)
 Transition of Care [facility]) - Initial/Assessment Note    Patient Details  Name: Ashley Freeman MRN: [id number] Date of Birth: [birth year]  Transition of Care [facility]) CM/SW Contact:    Landry DELENA Senters, RN Phone Number: 05/26/2024, 9:17 AM  Clinical Narrative:                 RR:[id number] history significant for type 1 diabetes mellitus complicated by recurrent hospitalizations for DKA, chronic abdominal pain, end-stage renal disease on hemodialysis, bipolar disorder, chronic systolic/diastolic heart failure, seizure disorder, severe tricuspid regurgitation, anemia of chronic kidney disease with baseline hemoglobin 8-11, chronic transaminitis, who is admitted to [facility] on 05/24/2024 with DKA after presenting from home to [facility] ED complaining of abdominal pain   Patient lives with her mother and daughter, reports family/friend will transport her home at discharge.   Patient has PCP, manages own medications, she does drive but doesn't have a car.  CM discussed frequent admissions and transportation issues to dialysis reported by patient. Patient states she was told by [employer] that she has been dropped from her Medicaid and no longer qualifies for transportation. She reports having no other means of transportation to dialysis, which has resulted in several missed appts and rehospitalizations.  Patient is also requesting to switch from [facility] dialysis center to [place] [facility].   CM to send message to dialysis coordinator regarding possible switch in locations and CM will investigate transportation problems/need for new transportation to be set up.   CM will continue to follow.  Expected Discharge Plan: Home/Self Care Barriers to Discharge: Continued Medical Work up   Patient Goals and CMS Choice            Expected Discharge Plan and Services                                              Prior Living Arrangements/Services                        Activities of Daily Living   ADL Screening (condition at time of admission) Independently performs ADLs?: Yes (appropriate for developmental age) Is the patient deaf or have difficulty hearing?: No Does the patient have difficulty seeing, even when wearing glasses/contacts?: No Does the patient have difficulty concentrating, remembering, or making decisions?: No  Permission Sought/Granted                  Emotional Assessment              Admission diagnosis:  DKA (diabetic ketoacidosis) (HCC) [E11.10] Hyperglycemia [R73.9] Right upper quadrant abdominal pain [R10.11] Patient Active Problem List   Diagnosis Date Noted   Chronic abdominal pain 05/24/2024   History of bipolar disorder 05/24/2024   Severe tricuspid regurgitation 05/24/2024   History of essential hypertension 05/24/2024   Generalized weakness 05/01/2024   Ascites 04/23/2024   Seizure disorder (HCC) 04/13/2024   Acute on chronic diastolic heart failure (HCC) 03/22/2024   CAP (community acquired pneumonia) 03/22/2024   Diabetic ketoacidosis associated with type 1 diabetes mellitus (HCC) 03/16/2024   Neck abscess 02/22/2024   Sore throat 02/22/2024   HFrEF (heart failure with reduced ejection fraction) (HCC)    Chest pain 02/03/2024   Odynophagia 01/31/2024   Esophageal dysphagia 01/31/2024   Erosive esophagitis 01/31/2024   Hematemesis 01/26/2024   High anion gap metabolic  acidosis 01/16/2024   Allergic rhinitis 01/16/2024   History of anemia due to chronic kidney disease 01/16/2024   Hypervolemia associated with renal insufficiency 12/19/2023   Pain and swelling of right upper extremity 12/19/2023   History of seizures 11/17/2023   Hyperosmolar hyperglycemic state (HHS) (HCC) 11/03/2023   Volume overload 11/03/2023   Vulvar pain 11/03/2023   Generalized abdominal pain 08/27/2023   Elevated LFTs 07/20/2023   Cholecystitis 06/30/2023   Prolonged QT interval 06/30/2023   Bipolar disorder (HCC)  06/30/2023   Hyperglycemic crisis due to Type 1 diabetes mellitus (HCC) 05/20/2023   Acute on chronic HFrEF (heart failure with reduced ejection fraction) (HCC) 05/20/2023   Chronic combined systolic and diastolic heart failure (HCC) 05/20/2023   Generalized pain 05/20/2023   Hypoglycemia 09/08/2022   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) 05/26/2021   Dyslipidemia 05/26/2021   Anasarca 04/16/2021   Mild protein malnutrition 03/07/2021   DKA, type 1 (HCC) 12/08/2020   Type 1 diabetes mellitus (HCC) 02/08/2020   Transaminitis 10/05/2019   Leg edema, left 09/27/2019   Systolic ejection murmur 08/03/2019   Type 1 diabetes mellitus with hyperglycemia (HCC) 07/24/2019   Diabetic retinopathy (HCC) 07/02/2019   DM (diabetes mellitus), type 1 with renal complications (HCC) 05/21/2019   Diarrhea 05/31/2016   Diabetic neuropathy (HCC) 01/16/2016   Preop cardiovascular exam 08/24/2013   Pseudohyponatremia 08/04/2012   Hypokalemia 05/07/2012   Lactic acidosis 02/19/2012   Leukocytosis 02/19/2012   Normocytic anemia 02/19/2012   Hyperkalemia 02/19/2012   Hyperglycemia 11/06/2011   Stable proliferative diabetic retinopathy associated with type 1 diabetes mellitus (HCC) 10/13/1997   PCP:  Keven Crumbly Pap,  MD Pharmacy:   [facility] [place]  - [address] [place] ([place]) [place] [address] Phone: [phone] Fax: [phone]  [facility] [address] [place] [address] Phone: [phone] Fax: [phone]  [facility] [address] [place] [address] Phone: [phone] Fax: [phone]     Social Drivers of Health (SDOH) Social History: SDOH Screenings   Food Insecurity: No Food Insecurity (05/25/2024)  Housing: Low Risk (05/25/2024)  Transportation Needs: No Transportation Needs (05/25/2024)  Utilities: Not At Risk (05/25/2024)  Financial Resource Strain: Medium Risk (05/13/2024)   Received from [facility]  Physical Activity: Insufficiently Active (03/02/2022)   Received from [facility]  Social Connections: Moderately Isolated (05/03/2024)  Stress: No Stress Concern Present (03/08/2023)   Received from [facility]  Tobacco Use: Low Risk (05/24/2024)   SDOH Interventions:     Readmission Risk Interventions    03/23/2024    2:19 PM 02/06/2024    2:01 PM 11/28/2023   11:49 AM  Readmission Risk Prevention Plan  Transportation Screening Complete Complete Complete  Medication Review (RN Care  Manager) Complete Referral to Pharmacy Complete  PCP or Specialist appointment within 3-5 days of discharge Complete Complete Complete  HRI or Home Care Consult Complete Complete Complete  SW Recovery Care/Counseling Consult  Complete   Palliative Care Screening Not Applicable Not Applicable Not Applicable  Skilled Nursing Facility Not Applicable Not Applicable Not Applicable

## 2024-05-27 ENCOUNTER — Inpatient Hospital Stay (HOSPITAL_COMMUNITY)

## 2024-05-27 DIAGNOSIS — E1010 Type 1 diabetes mellitus with ketoacidosis without coma: Secondary | ICD-10-CM | POA: Diagnosis not present

## 2024-05-27 LAB — BODY FLUID CELL COUNT WITH DIFFERENTIAL
Eos, Fluid: 0 %
Lymphs, Fluid: 72 %
Monocyte-Macrophage-Serous Fluid: 25 % — ABNORMAL LOW (ref 50–90)
Neutrophil Count, Fluid: 2 % (ref 0–25)
Other Cells, Fluid: 1 %
Total Nucleated Cell Count, Fluid: 206 uL (ref 0–1000)

## 2024-05-27 LAB — COMPREHENSIVE METABOLIC PANEL WITH GFR
ALT: 48 U/L — ABNORMAL HIGH (ref 0–44)
AST: 39 U/L (ref 15–41)
Albumin: 4 g/dL (ref 3.5–5.0)
Alkaline Phosphatase: 534 U/L — ABNORMAL HIGH (ref 38–126)
Anion gap: 12 (ref 5–15)
BUN: 22 mg/dL — ABNORMAL HIGH (ref 6–20)
CO2: 27 mmol/L (ref 22–32)
Calcium: 8.7 mg/dL — ABNORMAL LOW (ref 8.9–10.3)
Chloride: 100 mmol/L (ref 98–111)
Creatinine, Ser: 3.94 mg/dL — ABNORMAL HIGH (ref 0.44–1.00)
GFR, Estimated: 15 mL/min — ABNORMAL LOW
Glucose, Bld: 348 mg/dL — ABNORMAL HIGH (ref 70–99)
Potassium: 5.1 mmol/L (ref 3.5–5.1)
Sodium: 139 mmol/L (ref 135–145)
Total Bilirubin: 0.4 mg/dL (ref 0.0–1.2)
Total Protein: 6 g/dL — ABNORMAL LOW (ref 6.5–8.1)

## 2024-05-27 LAB — GLUCOSE, CAPILLARY
Glucose-Capillary: 312 mg/dL — ABNORMAL HIGH (ref 70–99)
Glucose-Capillary: 213 mg/dL — ABNORMAL HIGH (ref 70–99)
Glucose-Capillary: 165 mg/dL — ABNORMAL HIGH (ref 70–99)

## 2024-05-27 LAB — HEPATITIS B SURFACE ANTIBODY, QUANTITATIVE: Hep B S AB Quant (Post): <3.5 m[IU]/mL — ABNORMAL LOW

## 2024-05-27 MED ORDER — INSULIN GLARGINE 100 UNIT/ML ~~LOC~~ SOLN
3.0000 [IU] | Freq: Every day | SUBCUTANEOUS | Status: DC
  Administered 2024-05-27: 3 [IU] via SUBCUTANEOUS
  Filled 2024-05-27: qty 0.03

## 2024-05-27 MED ORDER — INSULIN ASPART 100 UNIT/ML IJ SOLN: 2.0000 [IU] | Freq: Three times a day (TID) | INTRAMUSCULAR | Status: DC | PRN

## 2024-05-27 MED ORDER — FENTANYL CITRATE (PF) 50 MCG/ML IJ SOSY
12.5000 ug | PREFILLED_SYRINGE | Freq: Once | INTRAMUSCULAR | Status: AC
  Administered 2024-05-27: 12.5 ug via INTRAVENOUS
  Filled 2024-05-27: qty 1

## 2024-05-27 MED ORDER — LIDOCAINE-EPINEPHRINE 1 %-1:100000 IJ SOLN
INTRAMUSCULAR | Status: AC
  Filled 2024-05-27: qty 20

## 2024-05-27 MED ORDER — LIDOCAINE-EPINEPHRINE 1 %-1:100000 IJ SOLN
10.0000 mL | Freq: Once | INTRAMUSCULAR | Status: AC
  Administered 2024-05-27: 10 mL via INTRADERMAL

## 2024-05-27 NOTE — Plan of Care (Signed)
" °  Problem: Metabolic: Goal: Ability to maintain appropriate glucose levels will improve Outcome: Progressing   Problem: Tissue Perfusion: Goal: Adequacy of tissue perfusion will improve Outcome: Progressing   Problem: Clinical Measurements: Goal: Ability to maintain clinical measurements within normal limits will improve Outcome: Progressing Goal: Will remain free from infection Outcome: Progressing   Problem: Activity: Goal: Risk for activity intolerance will decrease Outcome: Progressing   Problem: Nutrition: Goal: Adequate nutrition will be maintained Outcome: Progressing   Problem: Pain Managment: Goal: General experience of comfort will improve and/or be controlled Outcome: Progressing   Problem: Safety: Goal: Ability to remain free from injury will improve Outcome: Progressing   "

## 2024-05-27 NOTE — Progress Notes (Signed)
 Pt requesting IV pain medication stating it is the only thing effective for abdominal pain. Hydrocodone  dose administered less than 1 hour ago. Patient advised to allow medication the time to work but requesting that I contact MD to get IV pain medication because she knows that oral meds will not be effective. States that if she comes to the hospital she should be able to get the medication that works for what is wrong with her otherwise she can stay at home and be in pain. Advised nurse that she was going to leave if she could not get the meds she was requesting. AMA explained to patient and paperwork completed. Dr. Sundil on call and made aware of conversation with patient and patient intent to leave.

## 2024-05-27 NOTE — Progress Notes (Signed)
 [facility] Progress Note   OP HD: TTS [facility] ([address]) 3h  B400  57.5kg  2K bath  R AVG  Hep 1600  Assessment/ Plan:  # ESRD - on HD TTS in [place] - recently admitted at [facility] with last dialysis Monday  - chronically vol overloaded - had HD here Sat + Tues with 3L net UF tolerated without cramping - supposed to be dc'd today but will have HD orders written in case     # HTN - BP's up 140s/ 90s - getting coreg  po     # Volume - tr  LE edema - up by wts -> usually 8-12 kg over, chronic issue - UF 3 L w/ HD Tues tolerated; very [place]     # Anemia of esrd - transfuse if Hb <7    # DKA  - per pmd   Subjective:  Pt seen in room, some abd pain but tolerating PO's No c/o's today, tolerated HD Tues without cramping  Presentation summary: 34 y.o. female with history of uncontrolled diabetes followed by endocrinology recently admitted to [facility] for DKA and just discharged with last dialysis treatment on Monday which was a shortened treatment because of plan for discharge.  Patient now back and missed dialysis because of transportation issues, has not been eating which led her to not use her insulin  now presenting with abdominal pain and nausea.  She denies any fever, chills, shortness of breath but does have diffuse edema; she knows she is well up on her dry weight.   Vitals:   05/27/24 0205 05/27/24 0500 05/27/24 0551 05/27/24 0737  BP: (!) 146/41  (!) 154/89 (!) 193/83  Pulse:   80 87  Resp: (!) 23  19 (!) 24  Temp: 98 F (36.7 C)  97.8 F (36.6 C) 98.2 F (36.8 C)  TempSrc: Oral  Oral Oral  SpO2:      Weight:  65.8 kg    Height:        Exam: Gen alert & oriented x3 No jvd or bruits Chest CTA b/l RRR no MRG Abd soft ntnd no mass, 1-2+ ascites +bs Ext tr pitting edema LEs Neuro moving all 4 ext RUE AVG+ bruit, no sig hematoma   Home bp meds: Coreg   Bumex  1 mg daily      Recent Labs  Lab 05/23/24 0500 05/24/24 1748  05/24/24 1749 05/25/24 0301 05/25/24 1237  HGB 8.3*   < > 9.9* 9.7*  --   ALBUMIN  3.2*  --  3.9 4.0  --   CALCIUM  8.0*  --  8.2* 8.6* 8.5*  PHOS 5.6*  --   --  4.5  --   CREATININE 5.11*  --  4.50* 4.67* 4.71*  K 4.6   < > 5.3* 4.0 4.3   < > = values in this interval not displayed.   No results for input(s): IRON , TIBC, FERRITIN in the last 168 hours. Inpatient medications:  carvedilol   25 mg Oral BID WC   Chlorhexidine  Gluconate Cloth  6 each Topical Q0600   DULoxetine   20 mg Oral Daily   insulin  aspart  0-6 Units Subcutaneous TID WC   insulin  glargine  3 Units Subcutaneous Daily   lacosamide   100 mg Oral BID   OLANZapine   5 mg Oral QHS   pantoprazole   40 mg Oral QHS   sevelamer  carbonate  800 mg Oral TID WC   sodium chloride  flush  3 mL Intravenous Q12H  acetaminophen  **OR** acetaminophen , dicyclomine , HYDROcodone -acetaminophen , labetalol , melatonin, metoCLOPramide  (REGLAN ) injection, naLOXone  (NARCAN )  injection, prochlorperazine , sodium chloride  flush

## 2024-05-27 NOTE — Procedures (Signed)
 PROCEDURE SUMMARY:  Successful image-guided paracentesis from the right lower abdomen.  Yielded 4 liters of pale yellow colored fluid.  No immediate complications.  EBL: zero Patient tolerated well.   Specimen was sent for labs.  Please see imaging section of Epic for full dictation.  Greig FORBES Jasmine PA-C 05/27/2024 10:32 AM

## 2024-05-27 NOTE — Progress Notes (Signed)
 " Progress Note   Patient: Ashley Freeman FMW:[id number] DOB: [birth year] DOA: 05/24/2024     2 DOS: the patient was seen and examined on 05/27/2024   Brief hospital course:   34 y.o. female with medical history significant for type 1 diabetes mellitus complicated by recurrent hospitalizations for DKA, chronic abdominal pain, end-stage renal disease on hemodialysis, bipolar disorder, chronic systolic/diastolic heart failure, seizure disorder, severe tricuspid regurgitation, anemia of chronic kidney disease with baseline hemoglobin 8-11, chronic transaminitis, who is admitted to [facility] on 05/24/2024 with DKA after presenting from home to [facility] ED complaining of abdominal pain.   Out of DKA now.  Has been started on insulin  regimen.  Diabetes coordinator on board.  She is from home and lives with her mother and daughter.  Patient is a hemodialysis patient, last hemodialysis was on Saturday.  Her schedule is Tuesday, Thursday and Saturdays.  She has a right arm graft in place for hemodialysis.  Assessment and Plan:  Diabetic ketoacidosis, POA: In the setting of poorly controlled insulin -dependent type 2 diabetes mellitus with most recent A1c of 8.5% on 03/30/2025.  Very poor compliance with insulin , counseled, multiple such admissions.  Insulin  adjusted further on 05/27/2024 for better control.  DKA has resolved.  Note her blood sugars are extremely labile.   Lab Results  Component Value Date   HGBA1C 8.5 (H) 03/30/2024   CBG (last 3)  Recent Labs    05/26/24 2014 05/27/24 0732 05/27/24 1204  GLUCAP 141* 312* 213*     ESRD, on hemodialysis.  Tuesday, Thursday, Saturday schedule.  Last hemodialysis was on Saturday.  Nephrology consulted for hemodialysis management.  Rest of management as per nephrology  Chronic anasarca with ascites.  Has required therapeutic paracentesis in the past, abdomen distended on 05/27/2024 repeat paracentesis ordered by IR, 4 L removed.   Follow fluid studies   bipolar disorder, POA: Continue with olanzapine  and Cymbalta .  No acute issues  Chronic systolic/diastolic heart failure, POA: Daily weight, strict intake of monitoring, heart healthy/renal diet, continue with Coreg  25 mg p.o. twice daily  Essential hypertension, POA: Continue with home Coreg .  Monitor blood pressure closely.  Continue with as needed IV labetalol  for systolic blood pressure greater than 180 or diastolic blood pressure greater than 120 mmHg.  Monitor on telemetry closely.  History of seizures, POA: Continue with Vimpat   Transaminitis, POA: No acute issues.  This is likely multifactorial in the setting of congestive hepatopathy in the setting of congestive heart failure/right-sided heart failure and severe TR with chronic ascites.  No acute issues.  Outpatient follow-up with GI if needed   Subjective: Patient in bed, appears comfortable, denies any headache, no fever, no chest pain or pressure, no shortness of breath , feels her abdomen is getting distended with generalized ache   Physical Exam: Vitals:   05/27/24 0500 05/27/24 0551 05/27/24 0737 05/27/24 1204  BP:  (!) 154/89 (!) 193/83 (!) 146/86  Pulse:  80 87 81  Resp:  19 (!) 24 18  Temp:  97.8 F (36.6 C) 98.2 F (36.8 C) 98.4 F (36.9 C)  TempSrc:  Oral Oral Oral  SpO2:      Weight: 65.8 kg     Height:       Awake Alert, No new F.N deficits, Normal affect [place].AT,PERRAL Supple Neck, No JVD,   Symmetrical Chest wall movement, Good air movement bilaterally, CTAB RRR,No Gallops, Rubs or new Murmurs,  +ve B.Sounds, Abd Soft, abdomen is distended with ascites No  Cyanosis, Clubbing or edema    Data Reviewed:  There are no new results to review at this time.  Family Communication: None at the bedside   Data Review:   Patient Lines/Drains/Airways Status     Active Line/Drains/Airways     Name Placement date Placement time Site Days   Peripheral IV 05/24/24 20 G 1.88  Anterior;Distal;Left;Upper Arm 05/24/24  1753  Arm  3   Fistula / Graft Right Upper arm Arteriovenous vein graft 07/06/22  1013  Upper arm  691             Inpatient Medications  Scheduled Meds:  carvedilol   25 mg Oral BID WC   Chlorhexidine  Gluconate Cloth  6 each Topical Q0600   DULoxetine   20 mg Oral Daily   insulin  aspart  0-6 Units Subcutaneous TID WC   insulin  glargine  3 Units Subcutaneous Daily   lacosamide   100 mg Oral BID   OLANZapine   5 mg Oral QHS   pantoprazole   40 mg Oral QHS   sevelamer  carbonate  800 mg Oral TID WC   sodium chloride  flush  3 mL Intravenous Q12H   Continuous Infusions: PRN Meds:.acetaminophen  **OR** acetaminophen , dicyclomine , HYDROcodone -acetaminophen , labetalol , melatonin, metoCLOPramide  (REGLAN ) injection, naLOXone  (NARCAN )  injection, prochlorperazine , sodium chloride  flush  DVT Prophylaxis  SCDs Start: 05/24/24 1959       Recent Labs  Lab 05/20/24 1632 05/20/24 1707 05/20/24 2038 05/23/24 0500 05/24/24 1748 05/24/24 1749 05/25/24 0301  WBC 10.4  --  11.3* 9.8  --  9.6 18.7*  HGB 8.4*   < > 8.2* 8.3* 10.9* 9.9* 9.7*  HCT 29.1*   < > 27.4* 26.6* 32.0* 33.9* 32.4*  PLT 300  --  305 239  --  230 233  MCV 92.4  --  88.1 85.3  --  91.4 89.0  MCH 26.7  --  26.4 26.6  --  26.7 26.6  MCHC 28.9*  --  29.9* 31.2  --  29.2* 29.9*  RDW 15.9*  --  15.8* 16.1*  --  16.0* 16.1*  LYMPHSABS 3.4  --   --   --   --  3.4 9.0*  MONOABS 0.9  --   --   --   --  0.7 1.1*  EOSABS 0.6*  --   --   --   --  1.5* 2.4*  BASOSABS 0.1  --   --   --   --  0.1 0.2*   < > = values in this interval not displayed.    Recent Labs  Lab 05/22/24 0550 05/23/24 0500 05/24/24 1748 05/24/24 1749 05/24/24 2237 05/25/24 0301 05/25/24 1237 05/27/24 0924  NA 136 139 132* 133*  --  137 139 139  K 3.9 4.6 4.9 5.3*  --  4.0 4.3 5.1  CL 98 103  --  94*  --  97* 99 100  CO2 25 25  --  23  --  22 26 27   ANIONGAP 14 11  --  16*  --  19* 15 12  GLUCOSE 186* 99   --  822*  --  146* 21* 348*  BUN 24* 28*  --  23*  --  25* 26* 22*  CREATININE 4.30* 5.11*  --  4.50*  --  4.67* 4.71* 3.94*  AST  --   --   --  133*  --  113*  --  39  ALT  --   --   --  63*  --  66*  --  48*  ALKPHOS  --   --   --  568*  --  577*  --  534*  BILITOT  --   --   --  0.5  --  0.5  --  0.4  ALBUMIN  3.2* 3.2*  --  3.9  --  4.0  --  4.0  PROCALCITON  --   --   --   --  0.66  --   --   --   INR  --   --   --   --   --  1.2  --   --   MG  --   --   --   --  2.0 2.1  --   --   PHOS 3.9 5.6*  --   --   --  4.5  --   --   CALCIUM  7.7* 8.0*  --  8.2*  --  8.6* 8.5* 8.7*      Recent Labs  Lab 05/23/24 0500 05/24/24 1749 05/24/24 2237 05/25/24 0301 05/25/24 1237 05/27/24 0924  PROCALCITON  --   --  0.66  --   --   --   INR  --   --   --  1.2  --   --   MG  --   --  2.0 2.1  --   --   CALCIUM  8.0* 8.2*  --  8.6* 8.5* 8.7*    --------------------------------------------------------------------------------------------------------------- Lab Results  Component Value Date   CHOL 235 (H) 05/24/2021   HDL 54.70 05/24/2021   LDLCALC 149 (H) 05/24/2021   TRIG 191 (H) 04/27/2024   CHOLHDL 4 05/24/2021    Lab Results  Component Value Date   HGBA1C 8.5 (H) 03/30/2024   No results for input(s): TSH, T4TOTAL, FREET4, T3FREE, THYROIDAB in the last 72 hours. No results for input(s): VITAMINB12, FOLATE, FERRITIN, TIBC, IRON , RETICCTPCT in the last 72 hours. ------------------------------------------------------------------------------------------------------------------ Cardiac Enzymes No results for input(s): CKMB, TROPONINI, MYOGLOBIN in the last 168 hours.  Invalid input(s): CK  Micro Results No results found for this or any previous visit (from the past 240 hours).  Radiology Reports  IR Paracentesis Result Date: 05/27/2024 INDICATION: 34 year old female. History of end-stage renal disease on hemodialysis with recurrent ascites. Request  is for therapeutic and diagnostic paracentesis. Maximum of 4 L EXAM: ULTRASOUND GUIDED THERAPEUTIC AND DIAGNOSTIC RIGHT SIDED PARACENTESIS MEDICATIONS: Lidocaine  1% 10 mL COMPLICATIONS: None immediate. PROCEDURE: Informed written consent was obtained from the patient after a discussion of the risks, benefits and alternatives to treatment. A timeout was performed prior to the initiation of the procedure. Initial ultrasound scanning demonstrates a moderate amount of ascites within the right lower abdominal quadrant. The right lower abdomen was prepped and draped in the usual sterile fashion. 1% lidocaine  was used for local anesthesia. Following this, a 19 gauge, 7-cm, Yueh catheter was introduced. An ultrasound image was saved for documentation purposes. The paracentesis was performed. The catheter was removed and a dressing was applied. The patient tolerated the procedure well without immediate post procedural complication. FINDINGS: A total of approximately 4 L of pale yellow fluid was removed. Samples were sent to the laboratory as requested by the clinical team. IMPRESSION: Successful ultrasound-guided therapeutic and diagnostic left-sided paracentesis yielding 4 liters of pale yellow peritoneal fluid. Performed by: Greig Jasmine PA Electronically Signed   By: CHRISTELLA.  Shick M.D.   On: 05/27/2024 12:04   DG Abd Portable 1V Result Date: 05/27/2024 EXAM: 1 VIEW XRAY OF THE ABDOMEN 05/27/2024  08:00:03 AM COMPARISON: 05/07/2024 CLINICAL HISTORY: Nausea. FINDINGS: BOWEL: Nonobstructive bowel gas pattern. Moderate gastric distention. SOFT TISSUES: Right upper quadrant surgical clips noted. No abnormal calcifications. BONES: No acute fracture. IMPRESSION: 1. Moderate gastric distention. 2. No bowel obstruction Electronically signed by: Norleen Boxer MD 05/27/2024 08:16 AM EST RP Workstation: [id number]      Signature  -   Lavada Stank M.D on 05/27/2024 at 12:12 PM   -  To page go to [email or web address]       For on call  review [email or web address].  "

## 2024-05-27 NOTE — Plan of Care (Signed)
" ° ° ° ° °  Against Medical Advice   Ashley Freeman expresses desire to leave the Hospital immediately. Patient has been decided to leave AGAINST MEDICAL ADVICE as she is not receiving IV pain medication every 1-2 hours specifically IV fentanyl  or Dilaudid  patient is requesting for.  Patient has been admitted for DKA which has been resolved.  Per chart review patient has pain medication seeking behavior and leaving AMA as well.  Patient has been warned that this is not medically advisable at this time, and can result in medical complications like Death and Disability. Patient understands and accepts the risks involved and assumes full responsibilty of this decision.  This patient has also been advised that if they feel the need for further medical assistance to return to any available ER or dial  [phone].  Informed by Nursing staff that this patient has left care and has signed the form  Against Medical Advice on 05/27/2024 at 8pm.  [name],MD [facility] [place]    "

## 2024-05-27 NOTE — TOC Progression Note (Signed)
 Transition of Care [facility]) - Progression Note    Patient Details  Name: UNIQUA KIHN MRN: [id number] Date of Birth: [birth year]  Transition of Care [facility]) CM/SW Contact  Landry DELENA Senters, RN Phone Number: 05/27/2024, 3:59 PM  Clinical Narrative:     Patient's Medicaid has lapsed due to patient not completing needed paperwork, so [employer] and other medicaid-driven transportation cannot be arranged.    Patient reports she will be calling Medicaid to fix this, so her transportation can be reinstated.   Continued medical workup.   CM will continue to follow.   Expected Discharge Plan: Home/Self Care Barriers to Discharge: Continued Medical Work up               Expected Discharge Plan and Services       Living arrangements for the past 2 months: Apartment Expected Discharge Date: 05/26/24                                     Social Drivers of Health (SDOH) Interventions SDOH Screenings   Food Insecurity: No Food Insecurity (05/25/2024)  Housing: Low Risk (05/25/2024)  Transportation Needs: No Transportation Needs (05/25/2024)  Utilities: Not At Risk (05/25/2024)  Financial Resource Strain: Medium Risk (05/13/2024)   Received from [facility]  Physical Activity: Insufficiently Active (03/02/2022)   Received from [facility]  Social Connections: Moderately Isolated (05/03/2024)  Stress: No Stress Concern Present (03/08/2023)   Received from [facility]  Tobacco Use: Low Risk (05/24/2024)    Readmission Risk Interventions    03/23/2024    2:19 PM 02/06/2024    2:01 PM 11/28/2023   11:49 AM  Readmission Risk Prevention Plan  Transportation Screening Complete Complete Complete  Medication Review Oceanographer) Complete Referral to Pharmacy Complete  PCP or Specialist appointment within 3-5 days of discharge Complete Complete Complete  HRI or Home Care Consult Complete Complete Complete  SW Recovery Care/Counseling Consult  Complete    Palliative Care Screening Not Applicable Not Applicable Not Applicable  Skilled Nursing Facility Not Applicable Not Applicable Not Applicable

## 2024-05-28 LAB — ALBUMIN, FLUID (OTHER): Albumin, Body Fluid Other: 2.3 g/dL

## 2024-05-28 LAB — PROTEIN, BODY FLUID (OTHER): Total Protein, Body Fluid Other: 2.9 g/dL

## 2024-05-28 LAB — AMYLASE, BODY FLUID (OTHER): Amylase, Body Fluid: 7 U/L

## 2024-05-28 LAB — CYTOLOGY - NON PAP

## 2024-05-28 NOTE — Discharge Summary (Signed)
"     Patient left AMA on 05/27/24 at 8pm, kindly seen the note from my partner.    Partner's note -     Against Medical Advice     DIALA WAXMAN expresses desire to leave the Hospital immediately. Patient has been decided to leave AGAINST MEDICAL ADVICE as she is not receiving IV pain medication every 1-2 hours specifically IV fentanyl  or Dilaudid  patient is requesting for.  Patient has been admitted for DKA which has been resolved.  Per chart review patient has pain medication seeking behavior and leaving AMA as well.   Patient has been warned that this is not medically advisable at this time, and can result in medical complications like Death and Disability. Patient understands and accepts the risks involved and assumes full responsibilty of this decision.   This patient has also been advised that if they feel the need for further medical assistance to return to any available ER or dial  [phone].   Informed by Nursing staff that this patient has left care and has signed the form  Against Medical Advice on 05/27/2024 at 8pm.   Subina Sundil,MD [facility]   "

## 2024-05-28 NOTE — Progress Notes (Addendum)
 Late note entry 0912am 1/29  Noted that pt has left AMA.  Contacted pt out-pt HD clinic [facility], to inform of this. They state that pt has called them stating she will be going to a clinic in [place]. They state pt has been discharged from their facility for this reasoning given by pt and that also because pt has not been there since December. A new referral would need to be submitted.   As far as the clinic in [place], Navigator informed social work that she was denied by [facility] on [address], as documented earlier during this admission. Clinic manager of this clinic also spoke with pt via phone and informed pt of this.   Navigator not aware of any [facility] clinic in [place] that she is set up with. Pt now has no out-pt HD clinic, and has left AMA.   Navigator attempted to contact pt via phone to see whsat her plans are regardiung this, no answer. Confidential VM left. Contacted emergency contact, no answer and voicemail full. Will continue attempts to reach out.   Lavanda Jovonne [facility] [phone]  Addendum 4:10pm Contacted by pt via phone who did say she does not have a clinic set up. Asked her if she is open to a [facility] referral for a clinic in [place], she stated yes. Will submit this referral tomorrow. Instructed pt to come to the ED in the meantime for HD.

## 2024-05-29 NOTE — Progress Notes (Signed)
 Late note entry 1/30 1150am  Referral submitted to [facility] admissions per request after speaking with pt. Hoping to find a spot in [place] closer to address. Will update  [facility]ley [facility] Nav [phone]

## 2024-05-29 NOTE — Telephone Encounter (Signed)
 Called patient left voicemail to schedule appointment - Patient notes: eyes Dr. Odelia

## 2024-05-30 ENCOUNTER — Encounter (HOSPITAL_COMMUNITY): Payer: Self-pay

## 2024-05-30 ENCOUNTER — Inpatient Hospital Stay (HOSPITAL_COMMUNITY)
Admission: EM | Admit: 2024-05-30 | Discharge: 2024-06-03 | DRG: 640 | Disposition: A | Attending: Internal Medicine | Admitting: Internal Medicine

## 2024-05-30 ENCOUNTER — Other Ambulatory Visit: Payer: Self-pay

## 2024-05-30 ENCOUNTER — Inpatient Hospital Stay (HOSPITAL_COMMUNITY)

## 2024-05-30 DIAGNOSIS — Z91158 Patient's noncompliance with renal dialysis for other reason: Secondary | ICD-10-CM

## 2024-05-30 DIAGNOSIS — N289 Disorder of kidney and ureter, unspecified: Secondary | ICD-10-CM

## 2024-05-30 DIAGNOSIS — E1165 Type 2 diabetes mellitus with hyperglycemia: Secondary | ICD-10-CM | POA: Diagnosis present

## 2024-05-30 DIAGNOSIS — R7401 Elevation of levels of liver transaminase levels: Secondary | ICD-10-CM | POA: Diagnosis present

## 2024-05-30 DIAGNOSIS — Z79899 Other long term (current) drug therapy: Secondary | ICD-10-CM

## 2024-05-30 DIAGNOSIS — K8681 Exocrine pancreatic insufficiency: Secondary | ICD-10-CM | POA: Diagnosis present

## 2024-05-30 DIAGNOSIS — Z825 Family history of asthma and other chronic lower respiratory diseases: Secondary | ICD-10-CM

## 2024-05-30 DIAGNOSIS — T8241XA Breakdown (mechanical) of vascular dialysis catheter, initial encounter: Secondary | ICD-10-CM | POA: Diagnosis not present

## 2024-05-30 DIAGNOSIS — M542 Cervicalgia: Secondary | ICD-10-CM | POA: Diagnosis not present

## 2024-05-30 DIAGNOSIS — E875 Hyperkalemia: Principal | ICD-10-CM | POA: Diagnosis present

## 2024-05-30 DIAGNOSIS — Z833 Family history of diabetes mellitus: Secondary | ICD-10-CM

## 2024-05-30 DIAGNOSIS — Z885 Allergy status to narcotic agent status: Secondary | ICD-10-CM

## 2024-05-30 DIAGNOSIS — I10 Essential (primary) hypertension: Secondary | ICD-10-CM | POA: Diagnosis present

## 2024-05-30 DIAGNOSIS — D631 Anemia in chronic kidney disease: Secondary | ICD-10-CM | POA: Diagnosis present

## 2024-05-30 DIAGNOSIS — I5042 Chronic combined systolic (congestive) and diastolic (congestive) heart failure: Secondary | ICD-10-CM | POA: Diagnosis present

## 2024-05-30 DIAGNOSIS — E8779 Other fluid overload: Principal | ICD-10-CM | POA: Diagnosis present

## 2024-05-30 DIAGNOSIS — N186 End stage renal disease: Secondary | ICD-10-CM | POA: Diagnosis present

## 2024-05-30 DIAGNOSIS — I132 Hypertensive heart and chronic kidney disease with heart failure and with stage 5 chronic kidney disease, or end stage renal disease: Secondary | ICD-10-CM | POA: Diagnosis present

## 2024-05-30 DIAGNOSIS — G8929 Other chronic pain: Secondary | ICD-10-CM | POA: Diagnosis present

## 2024-05-30 DIAGNOSIS — E785 Hyperlipidemia, unspecified: Secondary | ICD-10-CM | POA: Diagnosis present

## 2024-05-30 DIAGNOSIS — Z794 Long term (current) use of insulin: Secondary | ICD-10-CM

## 2024-05-30 DIAGNOSIS — Z765 Malingerer [conscious simulation]: Secondary | ICD-10-CM

## 2024-05-30 DIAGNOSIS — E1010 Type 1 diabetes mellitus with ketoacidosis without coma: Secondary | ICD-10-CM

## 2024-05-30 DIAGNOSIS — I428 Other cardiomyopathies: Secondary | ICD-10-CM | POA: Diagnosis present

## 2024-05-30 DIAGNOSIS — E1065 Type 1 diabetes mellitus with hyperglycemia: Secondary | ICD-10-CM | POA: Diagnosis present

## 2024-05-30 DIAGNOSIS — Z881 Allergy status to other antibiotic agents status: Secondary | ICD-10-CM

## 2024-05-30 DIAGNOSIS — M898X9 Other specified disorders of bone, unspecified site: Secondary | ICD-10-CM | POA: Diagnosis present

## 2024-05-30 DIAGNOSIS — E1022 Type 1 diabetes mellitus with diabetic chronic kidney disease: Secondary | ICD-10-CM | POA: Diagnosis present

## 2024-05-30 DIAGNOSIS — G40909 Epilepsy, unspecified, not intractable, without status epilepticus: Secondary | ICD-10-CM | POA: Diagnosis present

## 2024-05-30 DIAGNOSIS — Y718 Miscellaneous cardiovascular devices associated with adverse incidents, not elsewhere classified: Secondary | ICD-10-CM | POA: Diagnosis not present

## 2024-05-30 DIAGNOSIS — F319 Bipolar disorder, unspecified: Secondary | ICD-10-CM | POA: Diagnosis present

## 2024-05-30 DIAGNOSIS — R188 Other ascites: Secondary | ICD-10-CM | POA: Diagnosis present

## 2024-05-30 DIAGNOSIS — Z880 Allergy status to penicillin: Secondary | ICD-10-CM

## 2024-05-30 DIAGNOSIS — G894 Chronic pain syndrome: Secondary | ICD-10-CM | POA: Diagnosis present

## 2024-05-30 DIAGNOSIS — E8770 Fluid overload, unspecified: Secondary | ICD-10-CM

## 2024-05-30 DIAGNOSIS — E10649 Type 1 diabetes mellitus with hypoglycemia without coma: Secondary | ICD-10-CM | POA: Diagnosis not present

## 2024-05-30 DIAGNOSIS — R221 Localized swelling, mass and lump, neck: Secondary | ICD-10-CM | POA: Diagnosis not present

## 2024-05-30 DIAGNOSIS — R160 Hepatomegaly, not elsewhere classified: Secondary | ICD-10-CM | POA: Diagnosis present

## 2024-05-30 DIAGNOSIS — Z888 Allergy status to other drugs, medicaments and biological substances status: Secondary | ICD-10-CM

## 2024-05-30 DIAGNOSIS — R739 Hyperglycemia, unspecified: Secondary | ICD-10-CM

## 2024-05-30 DIAGNOSIS — Z992 Dependence on renal dialysis: Secondary | ICD-10-CM

## 2024-05-30 DIAGNOSIS — R54 Age-related physical debility: Secondary | ICD-10-CM | POA: Diagnosis present

## 2024-05-30 LAB — I-STAT CHEM 8, ED
BUN: 44 mg/dL — ABNORMAL HIGH (ref 6–20)
Calcium, Ion: 1.07 mmol/L — ABNORMAL LOW (ref 1.15–1.40)
Chloride: 106 mmol/L (ref 98–111)
Creatinine, Ser: 6.9 mg/dL — ABNORMAL HIGH (ref 0.44–1.00)
Glucose, Bld: 492 mg/dL — ABNORMAL HIGH (ref 70–99)
HCT: 30 % — ABNORMAL LOW (ref 36.0–46.0)
Hemoglobin: 10.2 g/dL — ABNORMAL LOW (ref 12.0–15.0)
Potassium: 7.9 mmol/L (ref 3.5–5.1)
Sodium: 137 mmol/L (ref 135–145)
TCO2: 23 mmol/L (ref 22–32)

## 2024-05-30 LAB — CBC WITH DIFFERENTIAL/PLATELET
Abs Immature Granulocytes: 0.06 10*3/uL (ref 0.00–0.07)
Basophils Absolute: 0.1 10*3/uL (ref 0.0–0.1)
Basophils Relative: 1 %
Eosinophils Absolute: 2.4 10*3/uL — ABNORMAL HIGH (ref 0.0–0.5)
Eosinophils Relative: 19 %
HCT: 29.2 % — ABNORMAL LOW (ref 36.0–46.0)
Hemoglobin: 9 g/dL — ABNORMAL LOW (ref 12.0–15.0)
Immature Granulocytes: 1 %
Lymphocytes Relative: 34 %
Lymphs Abs: 4.3 10*3/uL — ABNORMAL HIGH (ref 0.7–4.0)
MCH: 27.2 pg (ref 26.0–34.0)
MCHC: 30.8 g/dL (ref 30.0–36.0)
MCV: 88.2 fL (ref 80.0–100.0)
Monocytes Absolute: 0.7 10*3/uL (ref 0.1–1.0)
Monocytes Relative: 6 %
Neutro Abs: 5 10*3/uL (ref 1.7–7.7)
Neutrophils Relative %: 39 %
Platelets: 250 10*3/uL (ref 150–400)
RBC: 3.31 MIL/uL — ABNORMAL LOW (ref 3.87–5.11)
RDW: 16.5 % — ABNORMAL HIGH (ref 11.5–15.5)
Smear Review: NORMAL
WBC: 12.5 10*3/uL — ABNORMAL HIGH (ref 4.0–10.5)
nRBC: 0 % (ref 0.0–0.2)

## 2024-05-30 LAB — BASIC METABOLIC PANEL WITH GFR
Anion gap: 16 — ABNORMAL HIGH (ref 5–15)
BUN: 48 mg/dL — ABNORMAL HIGH (ref 6–20)
CO2: 17 mmol/L — ABNORMAL LOW (ref 22–32)
Calcium: 8.8 mg/dL — ABNORMAL LOW (ref 8.9–10.3)
Chloride: 103 mmol/L (ref 98–111)
Creatinine, Ser: 6.79 mg/dL — ABNORMAL HIGH (ref 0.44–1.00)
GFR, Estimated: 8 mL/min — ABNORMAL LOW
Glucose, Bld: 481 mg/dL — ABNORMAL HIGH (ref 70–99)
Potassium: 7.4 mmol/L (ref 3.5–5.1)
Sodium: 136 mmol/L (ref 135–145)

## 2024-05-30 LAB — BODY FLUID CULTURE W GRAM STAIN
Culture: NO GROWTH
Gram Stain: NONE SEEN

## 2024-05-30 LAB — CBG MONITORING, ED
Glucose-Capillary: 506 mg/dL (ref 70–99)
Glucose-Capillary: 344 mg/dL — ABNORMAL HIGH (ref 70–99)
Glucose-Capillary: 238 mg/dL — ABNORMAL HIGH (ref 70–99)
Glucose-Capillary: 158 mg/dL — ABNORMAL HIGH (ref 70–99)

## 2024-05-30 LAB — I-STAT VENOUS BLOOD GAS, ED
Acid-base deficit: 1 mmol/L (ref 0.0–2.0)
Bicarbonate: 25 mmol/L (ref 20.0–28.0)
Calcium, Ion: 1.07 mmol/L — ABNORMAL LOW (ref 1.15–1.40)
HCT: 29 % — ABNORMAL LOW (ref 36.0–46.0)
Hemoglobin: 9.9 g/dL — ABNORMAL LOW (ref 12.0–15.0)
O2 Saturation: 83 %
Potassium: 7.9 mmol/L (ref 3.5–5.1)
Sodium: 136 mmol/L (ref 135–145)
TCO2: 26 mmol/L (ref 22–32)
pCO2, Ven: 46.9 mmHg (ref 44–60)
pH, Ven: 7.336 (ref 7.25–7.43)
pO2, Ven: 52 mmHg — ABNORMAL HIGH (ref 32–45)

## 2024-05-30 LAB — COMPREHENSIVE METABOLIC PANEL WITH GFR
ALT: 49 U/L — ABNORMAL HIGH (ref 0–44)
AST: 107 U/L — ABNORMAL HIGH (ref 15–41)
Albumin: 3.4 g/dL — ABNORMAL LOW (ref 3.5–5.0)
Alkaline Phosphatase: 465 U/L — ABNORMAL HIGH (ref 38–126)
Anion gap: 12 (ref 5–15)
BUN: 45 mg/dL — ABNORMAL HIGH (ref 6–20)
CO2: 22 mmol/L (ref 22–32)
Calcium: 8.4 mg/dL — ABNORMAL LOW (ref 8.9–10.3)
Chloride: 103 mmol/L (ref 98–111)
Creatinine, Ser: 6.6 mg/dL — ABNORMAL HIGH (ref 0.44–1.00)
GFR, Estimated: 8 mL/min — ABNORMAL LOW
Glucose, Bld: 511 mg/dL (ref 70–99)
Potassium: >7.5 mmol/L (ref 3.5–5.1)
Sodium: 137 mmol/L (ref 135–145)
Total Bilirubin: 0.3 mg/dL (ref 0.0–1.2)
Total Protein: 5.2 g/dL — ABNORMAL LOW (ref 6.5–8.1)

## 2024-05-30 LAB — PROTIME-INR
INR: 1.3 — ABNORMAL HIGH (ref 0.8–1.2)
Prothrombin Time: 16.9 s — ABNORMAL HIGH (ref 11.4–15.2)

## 2024-05-30 LAB — BLOOD GAS, VENOUS
Acid-base deficit: 3.6 mmol/L — ABNORMAL HIGH (ref 0.0–2.0)
Bicarbonate: 23.1 mmol/L (ref 20.0–28.0)
O2 Saturation: 57.2 %
Patient temperature: 37
pCO2, Ven: 47 mmHg (ref 44–60)
pH, Ven: 7.3 (ref 7.25–7.43)
pO2, Ven: 38 mmHg (ref 32–45)

## 2024-05-30 LAB — LIPASE, FLUID: Lipase-Fluid: 10 U/L

## 2024-05-30 LAB — MAGNESIUM: Magnesium: 2.6 mg/dL — ABNORMAL HIGH (ref 1.7–2.4)

## 2024-05-30 MED ORDER — CALCIUM GLUCONATE-NACL 1-0.675 GM/50ML-% IV SOLN
1.0000 g | Freq: Once | INTRAVENOUS | Status: AC
  Administered 2024-05-30: 1000 mg via INTRAVENOUS
  Filled 2024-05-30: qty 50

## 2024-05-30 MED ORDER — PRISMASOL BGK 0/2.5 32-2.5 MEQ/L EC SOLN: Status: DC

## 2024-05-30 MED ORDER — SODIUM CHLORIDE 0.9 % IV SOLN
350.0000 [IU]/h | INTRAVENOUS | Status: DC
  Filled 2024-05-30: qty 2

## 2024-05-30 MED ORDER — FENTANYL CITRATE (PF) 50 MCG/ML IJ SOSY
25.0000 ug | PREFILLED_SYRINGE | INTRAMUSCULAR | Status: DC | PRN
  Administered 2024-05-30 – 2024-05-31 (×4): 25 ug via INTRAVENOUS
  Filled 2024-05-30 (×4): qty 1

## 2024-05-30 MED ORDER — SODIUM CHLORIDE 0.9 % IV SOLN: 250.0000 [IU]/h | INTRAVENOUS | Status: DC

## 2024-05-30 MED ORDER — METHOCARBAMOL 500 MG PO TABS
500.0000 mg | ORAL_TABLET | Freq: Four times a day (QID) | ORAL | Status: DC
  Administered 2024-05-30 – 2024-06-03 (×13): 500 mg via ORAL
  Filled 2024-05-30 (×14): qty 1

## 2024-05-30 MED ORDER — SODIUM ZIRCONIUM CYCLOSILICATE 10 G PO PACK
10.0000 g | PACK | Freq: Once | ORAL | Status: AC
  Administered 2024-05-30: 10 g via ORAL
  Filled 2024-05-30: qty 1

## 2024-05-30 MED ORDER — INSULIN ASPART 100 UNIT/ML IV SOLN
5.0000 [IU] | Freq: Once | INTRAVENOUS | Status: AC
  Administered 2024-05-30: 5 [IU] via INTRAVENOUS
  Filled 2024-05-30: qty 5

## 2024-05-30 MED ORDER — PANCRELIPASE (LIP-PROT-AMYL) 12000-38000 UNITS PO CPEP
12000.0000 [IU] | ORAL_CAPSULE | Freq: Three times a day (TID) | ORAL | Status: DC
  Administered 2024-05-31 – 2024-06-03 (×10): 12000 [IU] via ORAL
  Filled 2024-05-30 (×13): qty 1

## 2024-05-30 MED ORDER — METOCLOPRAMIDE HCL 5 MG PO TABS: 5.0000 mg | ORAL_TABLET | Freq: Three times a day (TID) | ORAL | Status: DC | PRN

## 2024-05-30 MED ORDER — SODIUM BICARBONATE 8.4 % IV SOLN
100.0000 meq | Freq: Once | INTRAVENOUS | Status: AC
  Administered 2024-05-30: 100 meq via INTRAVENOUS
  Filled 2024-05-30: qty 50

## 2024-05-30 MED ORDER — PROCHLORPERAZINE MALEATE 10 MG PO TABS
10.0000 mg | ORAL_TABLET | Freq: Once | ORAL | Status: DC
  Filled 2024-05-30: qty 2

## 2024-05-30 MED ORDER — SODIUM BICARBONATE 8.4 % IV SOLN: 100.0000 meq | Freq: Once | INTRAVENOUS | Status: DC

## 2024-05-30 MED ORDER — HYDROXYZINE HCL 25 MG PO TABS
25.0000 mg | ORAL_TABLET | Freq: Three times a day (TID) | ORAL | Status: DC | PRN
  Filled 2024-05-30 (×2): qty 1

## 2024-05-30 MED ORDER — FENTANYL CITRATE (PF) 50 MCG/ML IJ SOSY
50.0000 ug | PREFILLED_SYRINGE | Freq: Once | INTRAMUSCULAR | Status: AC
  Administered 2024-05-30: 50 ug via INTRAVENOUS
  Filled 2024-05-30: qty 1

## 2024-05-30 MED ORDER — DEXMEDETOMIDINE HCL IN NACL 400 MCG/100ML IV SOLN: 0.0000 ug/kg/h | INTRAVENOUS | Status: DC

## 2024-05-30 MED ORDER — ACETAMINOPHEN 325 MG PO TABS: 650.0000 mg | ORAL_TABLET | Freq: Four times a day (QID) | ORAL | Status: DC | PRN

## 2024-05-30 MED ORDER — IPRATROPIUM-ALBUTEROL 0.5-2.5 (3) MG/3ML IN SOLN: 3.0000 mL | RESPIRATORY_TRACT | Status: DC

## 2024-05-30 MED ORDER — SODIUM CHLORIDE 0.9 % IV SOLN
350.0000 [IU]/h | INTRAVENOUS | Status: DC
  Administered 2024-05-31 – 2024-06-01 (×2): 350 [IU]/h via INTRAVENOUS_CENTRAL
  Filled 2024-05-30: qty 2
  Filled 2024-05-30 (×2): qty 10000

## 2024-05-30 MED ORDER — ACETAMINOPHEN 500 MG PO TABS
1000.0000 mg | ORAL_TABLET | Freq: Once | ORAL | Status: DC
  Filled 2024-05-30: qty 2

## 2024-05-30 MED ORDER — ACETAMINOPHEN 650 MG RE SUPP: 650.0000 mg | Freq: Four times a day (QID) | RECTAL | Status: DC | PRN

## 2024-05-30 MED ORDER — PANTOPRAZOLE SODIUM 40 MG PO TBEC
40.0000 mg | DELAYED_RELEASE_TABLET | Freq: Every day | ORAL | Status: DC
  Administered 2024-05-30 – 2024-06-02 (×4): 40 mg via ORAL
  Filled 2024-05-30 (×4): qty 1

## 2024-05-30 MED ORDER — CARVEDILOL 25 MG PO TABS
25.0000 mg | ORAL_TABLET | Freq: Two times a day (BID) | ORAL | Status: DC
  Administered 2024-05-31 – 2024-06-03 (×7): 25 mg via ORAL
  Filled 2024-05-30 (×7): qty 1

## 2024-05-30 MED ORDER — INSULIN REGULAR(HUMAN) IN NACL 100-0.9 UT/100ML-% IV SOLN
INTRAVENOUS | Status: DC
  Administered 2024-05-30: 5.5 [IU]/h via INTRAVENOUS
  Filled 2024-05-30: qty 100

## 2024-05-30 MED ORDER — PRISMASOL BGK 2/3.5 32-2-3.5 MEQ/L EC SOLN: Status: DC

## 2024-05-30 MED ORDER — LACOSAMIDE 50 MG PO TABS
100.0000 mg | ORAL_TABLET | Freq: Two times a day (BID) | ORAL | Status: DC
  Administered 2024-05-30 – 2024-06-03 (×8): 100 mg via ORAL
  Filled 2024-05-30 (×8): qty 2

## 2024-05-30 MED ORDER — DEXTROSE 50 % IV SOLN
0.0000 mL | INTRAVENOUS | Status: DC | PRN
  Administered 2024-05-31 – 2024-06-01 (×3): 50 mL via INTRAVENOUS
  Administered 2024-06-01: 25 mL via INTRAVENOUS
  Administered 2024-06-01 (×2): 50 mL via INTRAVENOUS
  Filled 2024-05-30 (×5): qty 50

## 2024-05-30 MED ORDER — FENTANYL CITRATE (PF) 50 MCG/ML IJ SOSY
25.0000 ug | PREFILLED_SYRINGE | Freq: Once | INTRAMUSCULAR | Status: AC
  Administered 2024-05-30: 25 ug via INTRAVENOUS
  Filled 2024-05-30: qty 1

## 2024-05-30 MED ORDER — DULOXETINE HCL 20 MG PO CPEP
20.0000 mg | ORAL_CAPSULE | Freq: Every day | ORAL | Status: DC
  Administered 2024-05-31 – 2024-06-03 (×4): 20 mg via ORAL
  Filled 2024-05-30 (×4): qty 1

## 2024-05-30 MED ORDER — SUMATRIPTAN SUCCINATE 50 MG PO TABS
50.0000 mg | ORAL_TABLET | ORAL | Status: DC | PRN
  Administered 2024-06-01: 50 mg via ORAL
  Filled 2024-05-30 (×3): qty 1

## 2024-05-30 MED ORDER — FENTANYL CITRATE (PF) 50 MCG/ML IJ SOSY: 12.5000 ug | PREFILLED_SYRINGE | INTRAMUSCULAR | Status: DC | PRN

## 2024-05-30 MED ORDER — OLANZAPINE 5 MG PO TABS
5.0000 mg | ORAL_TABLET | Freq: Every day | ORAL | Status: DC
  Administered 2024-05-31 – 2024-06-02 (×3): 5 mg via ORAL
  Filled 2024-05-30 (×5): qty 1

## 2024-05-30 MED ORDER — HEPARIN SODIUM (PORCINE) 1000 UNIT/ML DIALYSIS: 1000.0000 [IU] | INTRAMUSCULAR | Status: DC | PRN

## 2024-05-30 MED ORDER — DICYCLOMINE HCL 10 MG PO CAPS: 10.0000 mg | ORAL_CAPSULE | Freq: Three times a day (TID) | ORAL | Status: DC | PRN

## 2024-05-30 MED ORDER — SEVELAMER CARBONATE 800 MG PO TABS
800.0000 mg | ORAL_TABLET | Freq: Three times a day (TID) | ORAL | Status: DC
  Administered 2024-05-31 – 2024-06-03 (×10): 800 mg via ORAL
  Filled 2024-05-30 (×11): qty 1

## 2024-05-30 NOTE — Assessment & Plan Note (Signed)
 Continue Coreg  Defer to nephrology when to restart Bumex 

## 2024-05-30 NOTE — Assessment & Plan Note (Signed)
 Evidence of fluid overload appreciate nephrology consult patient will need dialysis versus CRRT

## 2024-05-30 NOTE — Assessment & Plan Note (Signed)
As per nephrology 

## 2024-05-30 NOTE — Assessment & Plan Note (Signed)
 Managed through hemodialysis

## 2024-05-30 NOTE — Assessment & Plan Note (Signed)
 Not on statin given elevated LFTs

## 2024-05-30 NOTE — Consult Note (Incomplete)
 [facility] Nephrology Consultation Note  Requesting MD:  Reason for consult:   HPI:  Ashley Freeman is a 34 y.o. female.      PMHx:   Past Medical History:  Diagnosis Date   Abscess, gluteal, right 08/24/2013   Anemia 02/19/2012   Bartholin's gland abscess 09/19/2013   Bipolar disorder (HCC)    BV (bacterial vaginosis) 11/24/2015   Depression    Diabetes mellitus type I (HCC) 2001   Diagnosed at age 53 ; Type I   Diarrhea 05/30/2016   DKA (diabetic ketoacidoses) 08/19/2013   Also in 2018   ESRD (end stage renal disease) (HCC)    Gonorrhea 08/2011   Treated in 09/2011   HFrEF (heart failure with reduced ejection fraction) (HCC)    a. 2022 Echo: EF 40%; b. 10/2021 Echo: EF 55%; b. 07/2022 MV: No ischemia. EF 31%; c. 08/2022 Echo: EF 35%, mildly dil RV, sev TR.   History of trichomoniasis 05/31/2016   Hyperlipidemia 03/28/2016   Hypertension    NICM (nonischemic cardiomyopathy) (HCC)    Seizures (HCC)    Sepsis (HCC) 09/19/2013    Past Surgical History:  Procedure Laterality Date   A/V FISTULAGRAM Right 06/17/2023   Procedure: A/V Fistulagram;  Surgeon: Marea Selinda GORMAN, MD;  Location: [facility] INVASIVE CV LAB;  Service: Cardiovascular;  Laterality: Right;   A/V SHUNT INTERVENTION N/A 09/25/2023   Procedure: A/V SHUNT INTERVENTION;  Surgeon: Pearline Norman GORMAN, MD;  Location: [facility] PV LAB;  Service: Cardiovascular;  Laterality: N/A;   AV FISTULA PLACEMENT Right 07/06/2022   Procedure: ARTERIOVENOUS GRAFT CREATION;  Surgeon: Gretta Lonni PARAS, MD;  Location: [facility] OR;  Service: Vascular;  Laterality: Right;   CESAREAN SECTION N/A 10/05/2019   Procedure: CESAREAN SECTION;  Surgeon: Izell Harari, MD;  Location: [facility] LD ORS;  Service: Obstetrics;  Laterality: N/A;   CHOLECYSTECTOMY N/A 07/02/2023   Procedure: LAPAROSCOPIC CHOLECYSTECTOMY;  Surgeon: Ebbie Cough, MD;  Location: [facility] OR;  Service: General;  Laterality: N/A;   ESOPHAGOGASTRODUODENOSCOPY  N/A 01/31/2024   Procedure: EGD (ESOPHAGOGASTRODUODENOSCOPY);  Surgeon: San Sandor GAILS, DO;  Location: [facility] ENDOSCOPY;  Service: Gastroenterology;  Laterality: N/A;   INCISION AND DRAINAGE ABSCESS Left 09/28/2019   Procedure: INCISION AND DRAINAGE VULVAR ABCESS;  Surgeon: Edsel Norleen GAILS, MD;  Location: [facility] OR;  Service: Gynecology;  Laterality: Left;   INCISION AND DRAINAGE ABSCESS Right 02/23/2024   Procedure: INCISION AND DRAINAGE, ABSCESS;  Surgeon: Tobie Eldora NOVAK, MD;  Location: [facility] OR;  Service: ENT;  Laterality: Right;   INCISION AND DRAINAGE PERIRECTAL ABSCESS Right 08/18/2013   Procedure: IRRIGATION AND DEBRIDEMENT GLUTEAL ABSCESS;  Surgeon: Lynda Leos, MD;  Location: [facility] OR;  Service: General;  Laterality: Right;   INCISION AND DRAINAGE PERIRECTAL ABSCESS Right 09/19/2013   Procedure: IRRIGATION AND DEBRIDEMENT RIGHT GLUTEAL AND LABIAL ABSCESSES;  Surgeon: Lynda Leos, MD;  Location: [facility] OR;  Service: General;  Laterality: Right;   INCISION AND DRAINAGE PERIRECTAL ABSCESS Right 09/24/2013   Procedure: IRRIGATION AND DEBRIDEMENT PERIRECTAL ABSCESS;  Surgeon: Lynwood MALVA Pina, MD;  Location: [facility] OR;  Service: General;  Laterality: Right;   IR PARACENTESIS  08/28/2023   IR PARACENTESIS  11/04/2023   IR PARACENTESIS  12/23/2023   IR PARACENTESIS  02/04/2024   IR PARACENTESIS  03/23/2024   IR PARACENTESIS  04/06/2024   IR PARACENTESIS  05/27/2024    Family Hx:  Family History  Problem Relation Age of Onset   Asthma Mother    Carpal tunnel syndrome Mother  Gout Father    Diabetes Paternal Grandmother    Anesthesia problems Neg Hx     Social History:  reports that she has never smoked. She has never been exposed to tobacco smoke. She has never used smokeless tobacco. She reports that she does not currently use alcohol . She reports that she does not use drugs.  Allergies: Allergies[1]  Medications: Prior to Admission medications  Medication Sig Start Date End Date Taking?  Authorizing Provider  albuterol  (VENTOLIN  HFA) 108 (90 Base) MCG/ACT inhaler Inhale 2 puffs into the lungs every 4 (four) hours as needed for wheezing or shortness of breath. 11/24/22  Yes [provider]  bumetanide  (BUMEX ) 2 MG tablet Take 10 mg by mouth daily.   Yes [provider]  carvedilol  (COREG ) 25 MG tablet Take 1 tablet (25 mg total) by mouth 2 (two) times daily with a meal. Follow with your PCP for refills. 05/26/24 06/25/24 Yes Tobie Yetta HERO, MD  Cinnamon 500 MG capsule Take 500 mg by mouth in the morning.   Yes [provider]  dicyclomine  (BENTYL ) 10 MG capsule Take 1 capsule (10 mg total) by mouth every 8 (eight) hours as needed for spasms. 05/26/24  Yes Tobie Yetta HERO, MD  DULoxetine  (CYMBALTA ) 20 MG capsule Take 1 capsule (20 mg total) by mouth daily. 05/04/24  Yes Ghimire, Donalda HERO, MD  Elderberry-Vitamin C-Zinc  (ELDERBERRY IMMUNE HEALTH GUMMY PO) Take 1 tablet by mouth daily.   Yes [provider]  hydrOXYzine  (ATARAX ) 25 MG tablet Take 1 tablet (25 mg total) by mouth 3 (three) times daily as needed for anxiety, itching, nausea or vomiting. 05/04/24  Yes Ghimire, Donalda HERO, MD  insulin  aspart (NOVOLOG ) 100 UNIT/ML FlexPen Inject 2 Units into the skin 3 (three) times daily with meals. 05/10/24  Yes Cindy Garnette POUR, MD  lacosamide  (VIMPAT ) 50 MG TABS tablet Take 2 tablets (100 mg total) by mouth 2 (two) times daily. Take additional 1 tablet (50 mg) after dialysis on Tuesdays, Thursdays and Saturdays 05/04/24  Yes Ghimire, Donalda HERO, MD  LANTUS  SOLOSTAR 100 UNIT/ML Solostar Pen Inject 5 Units into the skin daily. 04/18/24  Yes Perri DELENA Meliton Mickey., MD  methocarbamol  (ROBAXIN ) 500 MG tablet Take 1 tablet (500 mg total) by mouth 4 (four) times daily for 5 days. 05/26/24 05/31/24 Yes Tobie Yetta HERO, MD  metoCLOPramide  (REGLAN ) 5 MG tablet Take 1 tablet (5 mg total) by mouth every 8 (eight) hours as needed for nausea. 05/26/24 05/26/25 Yes Tobie Yetta HERO, MD   OLANZapine  (ZYPREXA ) 5 MG tablet Take 1 tablet (5 mg total) by mouth at bedtime. 05/04/24  Yes Ghimire, Donalda HERO, MD  Pancrelipase , Lip-Prot-Amyl, 3000-9500 units CPEP Take 3,000 Units by mouth in the morning, at noon, and at bedtime.   Yes [provider]  pantoprazole  (PROTONIX ) 40 MG tablet Take 1 tablet (40 mg total) by mouth at bedtime. 05/26/24 06/09/24 Yes Tobie Yetta HERO, MD  sevelamer  carbonate (RENVELA ) 800 MG tablet Take 1 tablet (800 mg total) by mouth 3 (three) times daily with meals. 02/07/24  Yes Drusilla Sabas RAMAN, MD  SUMAtriptan  (IMITREX ) 50 MG tablet Take 50 mg by mouth every 2 (two) hours as needed for migraine or headache. 06/20/22  Yes [provider]  VELTASSA  16.8 g PACK Take 1 packet by mouth daily at 12 noon. 04/03/24  Yes [provider]  diazePAM , 15 MG Dose, 2 x 7.5 MG/0.1ML LQPK Place 15 mg into the nose as needed (sz lasting over 2  minutes). Patient not taking: Reported on 05/27/2024 04/28/24   Shelton Arlin KIDD, MD    {medication reviewed/display:[id number]}  Labs: Renal Panel: Recent Labs  Lab 05/24/24 1749 05/24/24 2237 05/25/24 0301 05/25/24 1237 05/27/24 0924 05/30/24 1657 05/30/24 1828  NA 133*  --  137 139 139 137 137  136  K 5.3*  --  4.0 4.3 5.1 >7.5* 7.9*  7.9*  CL 94*  --  97* 99 100 103 106  CO2 23  --  22 26 27 22   --   GLUCOSE 822*  --  146* 21* 348* 511* 492*  BUN 23*  --  25* 26* 22* 45* 44*  CREATININE 4.50*  --  4.67* 4.71* 3.94* 6.60* 6.90*  CALCIUM  8.2*  --  8.6* 8.5* 8.7* 8.4*  --   MG  --  2.0 2.1  --   --  2.6*  --   PHOS  --   --  4.5  --   --   --   --      CBC:    Latest Ref Rng & Units 05/30/2024    6:28 PM 05/30/2024    4:57 PM 05/25/2024    3:01 AM  CBC  WBC 4.0 - 10.5 K/uL  12.5  18.7   Hemoglobin 12.0 - 15.0 g/dL 87.9 - 84.9 g/dL 9.9    89.7  9.0  9.7   Hematocrit 36.0 - 46.0 % 36.0 - 46.0 % 29.0    30.0  29.2  32.4   Platelets 150 - 400 K/uL  250  233      Anemia Panel:  Recent Labs     11/17/23 1927 11/18/23 0630 11/18/23 0822 11/26/23 0045 05/24/24 1748 05/24/24 1749 05/25/24 0301 05/30/24 1657 05/30/24 1828  HGB 10.2* 10.9*  --    < > 10.9* 9.9* 9.7* 9.0* 10.2*  9.9*  MCV 88.0 88.4  --    < >  --  91.4 89.0 88.2  --   VITAMINB12  --  1,177*  --   --   --   --   --   --   --   FOLATE  --  12.1  --   --   --   --   --   --   --   FERRITIN  --   --  2,586*  --   --   --   --   --   --   TIBC  --   --  283  --   --   --   --   --   --   IRON   --   --  126  --   --   --   --   --   --   RETICCTPCT 2.4  --   --   --   --   --   --   --   --    < > = values in this interval not displayed.    Recent Labs  Lab 05/24/24 1749 05/25/24 0301 05/27/24 0924 05/30/24 1657  AST 133* 113* 39 107*  ALT 63* 66* 48* 49*  ALKPHOS 568* 577* 534* 465*  BILITOT 0.5 0.5 0.4 0.3  PROT 6.0* 6.2* 6.0* 5.2*  ALBUMIN  3.9 4.0 4.0 3.4*    Lab Results  Component Value Date   HGBA1C 8.5 (H) 03/30/2024    ROS:  {ros-complete:30496}  Physical Exam: Vitals:   05/30/24 1654  BP: (!) 160/88  Pulse: 97  Resp: 14  Temp: 98 F (36.7 C)  SpO2: 100%     General exam: Appears calm and comfortable  Respiratory system: Clear to auscultation. Respiratory effort normal. No wheezing or crackle Cardiovascular system: S1 & S2 heard, RRR.  No pedal edema. Gastrointestinal system: Abdomen is nondistended, soft and nontender. Normal bowel sounds heard. Central nervous system: Alert and oriented. No focal neurological deficits. Extremities: Symmetric 5 x 5 power. Skin: No rashes, lesions or ulcers Psychiatry: Judgement and insight appear normal. Mood & affect appropriate.   Assessment/Plan:  #  #   Ashley Freeman 05/30/2024, 8:16 PM  [facility].       [1]  Allergies Allergen Reactions   Keflex  [Cephalexin ] Anaphylaxis    Ceftriaxone  in the past with no reaction   Penicillins Anaphylaxis, Hives and Rash   Vibramycin  [Doxycycline ] Anaphylaxis    Benadryl  [Diphenhydramine ] Itching   Entresto  [Sacubitril -Valsartan ] Swelling    angioedema   Dilaudid  [Hydromorphone ] Itching   Losartan  Dermatitis   Methotrexate And Trimetrexate Rash   Roxicodone  [Oxycodone ] Itching    Takes Percocet without issue

## 2024-05-30 NOTE — ED Triage Notes (Signed)
 Pt bib [facility] coming from home with arrival complaint of needs dialysis. Pt states last time she was at dialysis was about three days ago. Pt reporting headache and abdominal pain. GCS 15. EMS VSS.

## 2024-05-30 NOTE — ED Provider Notes (Signed)
 " [place] [facility] Provider Note   CSN: [id number] Arrival date & time: 05/30/24  1637     Patient presents with: No chief complaint on file.   Ashley Freeman with a PMHx significant for ESRD on HD, T1DM, bipolar disorder, multiple hospitalization for DKA who presents today for evaluation of routine hemodialysis.  Patient reports that her last hemodialysis session was on 1/27 which was confirmed on chart review.  Has not had any dialysis since then.  Describes generalized pain throughout without any fevers, chills, cough or congestion.  Patient has a known history of migraines and also currently with headache consistent with chronic migraines.  No visual changes, peripheral weakness or numbness.  HPI     Prior to Admission medications  Medication Sig Start Date End Date Taking? Authorizing Provider  albuterol  (VENTOLIN  HFA) 108 (90 Base) MCG/ACT inhaler Inhale 2 puffs into the lungs every 4 (four) hours as needed for wheezing or shortness of breath. Patient not taking: Reported on 05/27/2024 11/24/22   [provider]  bumetanide  (BUMEX ) 2 MG tablet Take 10 mg by mouth daily.    [provider]  carvedilol  (COREG ) 25 MG tablet Take 1 tablet (25 mg total) by mouth 2 (two) times daily with a meal. Follow with your PCP for refills. 05/26/24 06/25/24  Patel, Pranav M, MD  Cinnamon 500 MG capsule Take 500 mg by mouth in the morning.    [provider]  diazePAM , 15 MG Dose, 2 x 7.5 MG/0.1ML LQPK Place 15 mg into the nose as needed (sz lasting over 2 minutes). Patient not taking: Reported on 05/27/2024 04/28/24   Yadav, Priyanka O, MD  dicyclomine  (BENTYL ) 10 MG capsule Take 1 capsule (10 mg total) by mouth every 8 (eight) hours as needed for spasms. 05/26/24   Tobie Yetta HERO, MD  DULoxetine  (CYMBALTA ) 20 MG capsule Take 1 capsule (20 mg total) by mouth daily. 05/04/24   Ghimire, Donalda HERO, MD   Elderberry-Vitamin C-Zinc  (ELDERBERRY IMMUNE HEALTH GUMMY PO) Take 1 tablet by mouth daily.    [provider]  fluticasone  (FLONASE ) 50 MCG/ACT nasal spray Place 2 sprays into both nostrils daily as needed for allergies or rhinitis. Patient not taking: Reported on 05/27/2024 12/11/23 12/10/24  [provider]  hydrOXYzine  (ATARAX ) 25 MG tablet Take 1 tablet (25 mg total) by mouth 3 (three) times daily as needed for anxiety, itching, nausea or vomiting. 05/04/24   Ghimire, Donalda HERO, MD  insulin  aspart (NOVOLOG ) 100 UNIT/ML FlexPen Inject 2 Units into the skin 3 (three) times daily with meals. 05/10/24   Cindy Garnette POUR, MD  lacosamide  (VIMPAT ) 50 MG TABS tablet Take 2 tablets (100 mg total) by mouth 2 (two) times daily. Take additional 1 tablet (50 mg) after dialysis on Tuesdays, Thursdays and Saturdays 05/04/24   Raenelle Donalda HERO, MD  LANTUS  SOLOSTAR 100 UNIT/ML Solostar Pen Inject 5 Units into the skin daily. 04/18/24   Perri DELENA Meliton Mickey., MD  methocarbamol  (ROBAXIN ) 500 MG tablet Take 1 tablet (500 mg total) by mouth 4 (four) times daily for 5 days. 05/26/24 05/31/24  Tobie Yetta HERO, MD  metoCLOPramide  (REGLAN ) 5 MG tablet Take 1 tablet (5 mg total) by mouth every 8 (eight) hours as needed for nausea. 05/26/24 05/26/25  Tobie Yetta HERO, MD  OLANZapine  (ZYPREXA ) 5 MG tablet Take 1 tablet (5 mg total) by mouth at bedtime. 05/04/24   Ghimire, Donalda HERO, MD  Pancrelipase , Lip-Prot-Amyl, 3000-9500 units CPEP Take 3,000 Units by mouth in the morning, at noon, and at bedtime.    [provider]  pantoprazole  (PROTONIX ) 40 MG tablet Take 1 tablet (40 mg total) by mouth at bedtime. 05/26/24 06/09/24  Tobie Yetta HERO, MD  sevelamer  carbonate (RENVELA ) 800 MG tablet Take 1 tablet (800 mg total) by mouth 3 (three) times daily with meals. 02/07/24   Drusilla Sabas RAMAN, MD  SUMAtriptan  (IMITREX ) 50 MG tablet Take 50 mg by mouth every 2 (two) hours as needed for migraine or headache. 06/20/22    [provider]  VELTASSA  16.8 g PACK Take 1 packet by mouth daily at 12 noon. 04/03/24   [provider]    Allergies: Keflex  [cephalexin ], Penicillins, Vibramycin  [doxycycline ], Benadryl  [diphenhydramine ], Entresto  [sacubitril -valsartan ], Dilaudid  [hydromorphone ], Losartan , Methotrexate and trimetrexate, and Roxicodone  [oxycodone ]    Review of Systems  Updated Vital Signs There were no vitals taken for this visit.  Physical Exam  (all labs ordered are listed, but only abnormal results are displayed) Labs Reviewed - No data to display  EKG: None  Radiology: No results found.   Procedures   Medications Ordered in the ED - No data to display  Clinical Course as of 05/30/24 2151  Sat May 30, 2024  1853 34 yo Freeman here for diaylsis needs, left AMA 3 days ago after hospitalization for DKA.  She complains of a mild headache.  BP elevated. No hypoxia or resp distress on exam.  Labs concerning for hyperkalemia, elevated glucose, bicarb wnl, no acidosis.  Plan for hyperK treatment, discussion with nephrology regarding dialysis, likely admission for BS control as well [MT]  1923 Admitted to hospitalist, ED resident paging nephrology as well regarding hyperK [MT]    Clinical Course User Index [MT] Trifan, Donnice PARAS, MD                                 Medical Decision Making Amount and/or Complexity of Data Reviewed Labs: ordered.  Risk OTC drugs. Prescription drug management. Decision regarding hospitalization.   Patient is a 34 year old Freeman who presents today for evaluation of routine hemodialysis.  On initial assessment patient was noted to be hypertensive with systolic values in the 160s.  Patient is otherwise hemodynamically stable and afebrile.  On bedside assessment patient was noted be resting comfortably no acute distress.  On my physical examination patient have a distended abdomen with tenderness throughout.  Patient reports that this is typical  when she is volume overloaded.  Denies any nausea or vomiting at this point in time.  Abdomen is otherwise without any rigidity or rebound.  Nonfocal neurologic exam.  No evidence of lower extremity edema.  Patient has recurrent issues with her blood sugar and repeat hesitations for DKA.  The patient is currently without any nausea, vomiting, altered mental status, I will obtain basic laboratory evaluation for further assessment as she recently left AMA from the hospital for DKA admission.  I suspect that her headaches likely secondary to chronic migraines and will provide Tylenol  here in the emergency department.  Lower concern at this time for any acute intracranial pathology such as CVA, ICH, TIA.  Reviewed patient's laboratory evaluation notable for a WBC of 12.5, potassium of greater than 7.5, blood glucose 511 without any acidosis or anion gap..  Patient's EKG normal sinus rhythm without any significant changes in T waves and otherwise normal intervals.  Given patient's significant  hyperkalemia, was provided insulin , Lokelma  and calcium  for initial management.  I feel that overall presentation likely related to electrolyte derangements secondary to missed hemodialysis.  No evidence for DKA at this point in time.  Patient was initially admitted to the hospital service.  Nephrology was consulted as well for consideration of urgent dialysis.  Unfortunately due to the weather, there is no available hemodialysis nurse at this point in time.  Given her critical laboratory values, will need critical care medicine at this point in time for CRRT overnight.  Patient was ultimately admitted to the ICU for further management.  Final diagnoses:  Hyperkalemia  Hyperglycemia    ED Discharge Orders     None          Laurita Sieving, MD 05/30/24 2153  "

## 2024-05-30 NOTE — Assessment & Plan Note (Signed)
 Patient has recurrent ascites requiring recurrent paracentesis  She also has transaminitis and History of hepatomegaly, hemo siderosis, suspected passive liver congestion Check INR May benefit from paracentesis while hospitalized

## 2024-05-30 NOTE — ED Notes (Signed)
 EDP notified of patient refusal of ordered medication. Also notified of difficulty obtaining lab work. EDP at bedside for ultrasound IV start.

## 2024-05-30 NOTE — Assessment & Plan Note (Signed)
 In the setting of insulin  non compliance Use insulin  drip to control BG for now

## 2024-05-30 NOTE — Assessment & Plan Note (Signed)
-   Continue Vimpat

## 2024-05-30 NOTE — Subjective & Objective (Signed)
" °  Patient presents with generalized pain no fevers no chills no cough last hemodialysis was on 27 January also reports headaches which is consistent with her chronic migraines. Notes abdomen has been distended which is typical for her when she is volume overloaded when she misses dialysis Patient's history of end-stage renal disease on hemodialysis Tuesday Thursday and Saturday at [facility] chronically volume overloaded has history of recurrent DKA's and uncontrolled diabetes followed by endocrinology with recurrent admissions to [facility] for DKA.  frequently has been missing dialysis because of transportation issues and not using insulin   Just left [facility] AGAINST MEDICAL ADVICE on 28 January  Today noted to have potassium up to 7.9 creatinine 6.9 with BUN of 44 also evidence of hyperglycemia but no DKA "
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
## 2024-05-30 NOTE — Assessment & Plan Note (Signed)
 Patient with chronic recurrent abdominal pain and generalized pain.  Heavy use of IV narcotics would benefit from referral to pain management unfortunately she has frequent hospitalizations which prevents her from following up as an outpatient

## 2024-05-30 NOTE — Consult Note (Signed)
 [facility] Nephrology Consult Note: Reason for Consult: To manage dialysis and dialysis related needs Referring Physician: Dr. Laurita and Dr. Silvester, Blease  HPI:  Ashley Freeman is an 34 y.o. female with past medical history of uncontrolled diabetes, DKA, hypertension, dyslipidemia, seizure disorder, ESRD on HD no outpatient HD unit presented with shortness of breath, generalized weakness, seen as a consultation for the management of ESRD and hyperkalemia. The patient comes to the ER frequently to receive dialysis.  She said the arrangement for outpatient dialysis to [facility] is in the process.  She used to get HD in [facility] but it seems like she was discharged from the center, details unknown. In the ER, she was afebrile, blood pressure elevated and in room air.  The labs showed potassium of 7.9, hyperglycemia, transaminitis and hemoglobin of 10.2. Patient reports generalized body pain.  She received medical treatment in ER including insulin , Lokelma , dextrose , calcium  gluconate. There is no dialysis nurse tonight because of inclement weather.  We discussed about doing CRRT in ICU.  Patient agreed for placement of HD catheter and CRRT because of severe hyperkalemia.  I have discussed with ICU team as well.  Past Medical History:  Diagnosis Date   Abscess, gluteal, right 08/24/2013   Anemia 02/19/2012   Bartholin's gland abscess 09/19/2013   Bipolar disorder (HCC)    BV (bacterial vaginosis) 11/24/2015   Depression    Diabetes mellitus type I (HCC) 2001   Diagnosed at age 20 ; Type I   Diarrhea 05/30/2016   DKA (diabetic ketoacidoses) 08/19/2013   Also in 2018   ESRD (end stage renal disease) (HCC)    Gonorrhea 08/2011   Treated in 09/2011   HFrEF (heart failure with reduced ejection fraction) (HCC)    a. 2022 Echo: EF 40%; b. 10/2021 Echo: EF 55%; b. 07/2022 MV: No ischemia. EF 31%; c. 08/2022 Echo: EF 35%, mildly dil RV, sev TR.   History of  trichomoniasis 05/31/2016   Hyperlipidemia 03/28/2016   Hypertension    NICM (nonischemic cardiomyopathy) (HCC)    Seizures (HCC)    Sepsis (HCC) 09/19/2013    Past Surgical History:  Procedure Laterality Date   A/V FISTULAGRAM Right 06/17/2023   Procedure: A/V Fistulagram;  Surgeon: Marea Selinda RAMAN, MD;  Location: [facility] INVASIVE CV LAB;  Service: Cardiovascular;  Laterality: Right;   A/V SHUNT INTERVENTION N/A 09/25/2023   Procedure: A/V SHUNT INTERVENTION;  Surgeon: Pearline Norman RAMAN, MD;  Location: [facility] PV LAB;  Service: Cardiovascular;  Laterality: N/A;   AV FISTULA PLACEMENT Right 07/06/2022   Procedure: ARTERIOVENOUS GRAFT CREATION;  Surgeon: Gretta Lonni PARAS, MD;  Location: [facility] OR;  Service: Vascular;  Laterality: Right;   CESAREAN SECTION N/A 10/05/2019   Procedure: CESAREAN SECTION;  Surgeon: Izell Harari, MD;  Location: [facility] LD ORS;  Service: Obstetrics;  Laterality: N/A;   CHOLECYSTECTOMY N/A 07/02/2023   Procedure: LAPAROSCOPIC CHOLECYSTECTOMY;  Surgeon: Ebbie Cough, MD;  Location: [facility] OR;  Service: General;  Laterality: N/A;   ESOPHAGOGASTRODUODENOSCOPY N/A 01/31/2024   Procedure: EGD (ESOPHAGOGASTRODUODENOSCOPY);  Surgeon: San Sandor GAILS, DO;  Location: [facility] ENDOSCOPY;  Service: Gastroenterology;  Laterality: N/A;   INCISION AND DRAINAGE ABSCESS Left 09/28/2019   Procedure: INCISION AND DRAINAGE VULVAR ABCESS;  Surgeon: Edsel Norleen GAILS, MD;  Location: [facility] OR;  Service: Gynecology;  Laterality: Left;   INCISION AND DRAINAGE ABSCESS Right 02/23/2024   Procedure: INCISION AND DRAINAGE, ABSCESS;  Surgeon: Tobie Eldora NOVAK, MD;  Location: [facility] OR;  Service: ENT;  Laterality:  Right;   INCISION AND DRAINAGE PERIRECTAL ABSCESS Right 08/18/2013   Procedure: IRRIGATION AND DEBRIDEMENT GLUTEAL ABSCESS;  Surgeon: Lynda Leos, MD;  Location: [facility] OR;  Service: General;  Laterality: Right;   INCISION AND DRAINAGE PERIRECTAL ABSCESS Right 09/19/2013   Procedure: IRRIGATION AND  DEBRIDEMENT RIGHT GLUTEAL AND LABIAL ABSCESSES;  Surgeon: Lynda Leos, MD;  Location: [facility] OR;  Service: General;  Laterality: Right;   INCISION AND DRAINAGE PERIRECTAL ABSCESS Right 09/24/2013   Procedure: IRRIGATION AND DEBRIDEMENT PERIRECTAL ABSCESS;  Surgeon: Lynwood MALVA Pina, MD;  Location[facility] OR;  Service: General;  Laterality: Right;   IR PARACENTESIS  08/28/2023   IR PARACENTESIS  11/04/2023   IR PARACENTESIS  12/23/2023   IR PARACENTESIS  02/04/2024   IR PARACENTESIS  03/23/2024   IR PARACENTESIS  04/06/2024   IR PARACENTESIS  05/27/2024    Family History  Problem Relation Age of Onset   Asthma Mother    Carpal tunnel syndrome Mother    Gout Father    Diabetes Paternal Grandmother    Anesthesia problems Neg Hx     Social History:  reports that she has never smoked. She has never been exposed to tobacco smoke. She has never used smokeless tobacco. She reports that she does not currently use alcohol . She reports that she does not use drugs.  Allergies: Allergies[1]  Medications: I have reviewed the patient's current medications.   Results for orders placed or performed during the hospital encounter of 05/30/24 (from the past 48 hours)  CBC with Differential     Status: Abnormal   Collection Time: 05/30/24  4:57 PM  Result Value Ref Range   WBC 12.5 (H) 4.0 - 10.5 K/uL   RBC 3.31 (L) 3.87 - 5.11 MIL/uL   Hemoglobin 9.0 (L) 12.0 - 15.0 g/dL   HCT 70.7 (L) 63.9 - 53.9 %   MCV 88.2 80.0 - 100.0 fL   MCH 27.2 26.0 - 34.0 pg   MCHC 30.8 30.0 - 36.0 g/dL   RDW 83.4 (H) 88.4 - 84.4 %   Platelets 250 150 - 400 K/uL    Comment: REPEATED TO VERIFY   nRBC 0.0 0.0 - 0.2 %   Neutrophils Relative % 39 %   Neutro Abs 5.0 1.7 - 7.7 K/uL   Lymphocytes Relative 34 %   Lymphs Abs 4.3 (H) 0.7 - 4.0 K/uL   Monocytes Relative 6 %   Monocytes Absolute 0.7 0.1 - 1.0 K/uL   Eosinophils Relative 19 %   Eosinophils Absolute 2.4 (H) 0.0 - 0.5 K/uL   Basophils Relative 1 %   Basophils Absolute  0.1 0.0 - 0.1 K/uL   WBC Morphology MORPHOLOGY UNREMARKABLE    RBC Morphology See Note    Smear Review Normal platelet morphology    Immature Granulocytes 1 %   Abs Immature Granulocytes 0.06 0.00 - 0.07 K/uL   Polychromasia PRESENT     Comment: Performed at [facility], [address], [place], [place] [address]  Comprehensive metabolic panel     Status: Abnormal   Collection Time: 05/30/24  4:57 PM  Result Value Ref Range   Sodium 137 135 - 145 mmol/L   Potassium >7.5 (HH) 3.5 - 5.1 mmol/L    Comment: Critical Value, Read Back and verified with BARLEY.M RN @1853  05/30/2024 ONKWARE   Chloride 103 98 - 111 mmol/L   CO2 22 22 - 32 mmol/L   Glucose, Bld 511 (HH) 70 - 99 mg/dL    Comment: Critical  Value, Read Back and verified with BARLEY.M RN @1853  05/30/2024 ONKWARE Glucose reference range applies only to samples taken after fasting for at least 8 hours.    BUN 45 (H) 6 - 20 mg/dL   Creatinine, Ser 3.39 (H) 0.44 - 1.00 mg/dL   Calcium  8.4 (L) 8.9 - 10.3 mg/dL   Total Protein 5.2 (L) 6.5 - 8.1 g/dL   Albumin  3.4 (L) 3.5 - 5.0 g/dL   AST 892 (H) 15 - 41 U/L   ALT 49 (H) 0 - 44 U/L   Alkaline Phosphatase 465 (H) 38 - 126 U/L   Total Bilirubin 0.3 0.0 - 1.2 mg/dL   GFR, Estimated 8 (L) >60 mL/min    Comment: (NOTE) Calculated using the CKD-EPI Creatinine Equation (2021)    Anion gap 12 5 - 15    Comment: Performed at [facility], [address], [place], [place] [address]  Magnesium      Status: Abnormal   Collection Time: 05/30/24  4:57 PM  Result Value Ref Range   Magnesium  2.6 (H) 1.7 - 2.4 mg/dL    Comment: Performed at [facility], [address], [place], [place] [address]  I-Stat venous blood gas, [facility] ED, [facility], [facility])     Status: Abnormal   Collection Time: 05/30/24  6:28 PM  Result Value Ref Range   pH, Ven 7.336 7.25 - 7.43   pCO2, Ven 46.9 44 - 60 mmHg   pO2, Ven 52 (H) 32 - 45 mmHg   Bicarbonate 25.0 20.0 - 28.0 mmol/L   TCO2 26 22 - 32 mmol/L   O2  Saturation 83 %   Acid-base deficit 1.0 0.0 - 2.0 mmol/L   Sodium 136 135 - 145 mmol/L   Potassium 7.9 (HH) 3.5 - 5.1 mmol/L   Calcium , Ion 1.07 (L) 1.15 - 1.40 mmol/L   HCT 29.0 (L) 36.0 - 46.0 %   Hemoglobin 9.9 (L) 12.0 - 15.0 g/dL   Sample type VENOUS    Comment NOTIFIED PHYSICIAN   I-stat chem 8, ED (not at [facility], [facility] or [facility])     Status: Abnormal   Collection Time: 05/30/24  6:28 PM  Result Value Ref Range   Sodium 137 135 - 145 mmol/L   Potassium 7.9 (HH) 3.5 - 5.1 mmol/L   Chloride 106 98 - 111 mmol/L   BUN 44 (H) 6 - 20 mg/dL   Creatinine, Ser 3.09 (H) 0.44 - 1.00 mg/dL   Glucose, Bld 507 (H) 70 - 99 mg/dL    Comment: Glucose reference range applies only to samples taken after fasting for at least 8 hours.   Calcium , Ion 1.07 (L) 1.15 - 1.40 mmol/L   TCO2 23 22 - 32 mmol/L   Hemoglobin 10.2 (L) 12.0 - 15.0 g/dL   HCT 69.9 (L) 63.9 - 53.9 %   Comment NOTIFIED PHYSICIAN   CBG monitoring, ED     Status: Abnormal   Collection Time: 05/30/24  7:20 PM  Result Value Ref Range   Glucose-Capillary 506 (HH) 70 - 99 mg/dL    Comment: Glucose reference range applies only to samples taken after fasting for at least 8 hours.   Comment 1 Notify RN    *Note: Due to a large number of results and/or encounters for the requested time period, some results have not been displayed. A complete set of results can be found in Results Review.    No results found.  ROS: As per H&P, rest of the systems  reviewed and negative. Blood pressure (!) 160/88, pulse 97, temperature 98 F (36.7 C), temperature source Oral, resp. rate 14, SpO2 100%. Gen: NAD, comfortable Respiratory: Clear bilateral, no wheezing or crackle Cardiovascular: Regular rate rhythm S1-S2 normal, no rubs GI: Abdomen soft, nontender, nondistended Extremities, no cyanosis or clubbing, no edema Skin: No rash or ulcer Neurology: Alert, awake, following commands, oriented Dialysis Access: Right upper extremity AV graft has good  thrill and bruit  Assessment/Plan:  # Severe hyperkalemia due to under dialysis as patient has no outpatient HD unit.  She received medical treatment for hyperkalemia in ER including calcium  gluconate, insulin , dextrose  and Lokelma .  Being admitted to ICU for CRRT.  # ESRD: AV graft for the access.  Unfortunately we are not able to dialysis tonight because of no nursing staff due to inclement weather.  Admitting to ICU for CRRT, patient will need temporary HD catheter.  Discussed with ICU provider/team.  # Hypertension: UF with HD, monitor BP.  # Anemia of ESRD: Hemoglobin at goal.  # Metabolic Bone Disease: Monitor labs,  Discussed with dialysis nurse, managers, ER providers, ICU provider.  Kelbie Moro Amelie Romney 05/30/2024, 8:24 PM      [1]  Allergies Allergen Reactions   Keflex  [Cephalexin ] Anaphylaxis    Ceftriaxone  in the past with no reaction   Penicillins Anaphylaxis, Hives and Rash   Vibramycin  [Doxycycline ] Anaphylaxis   Benadryl  [Diphenhydramine ] Itching   Entresto  [Sacubitril -Valsartan ] Swelling    angioedema   Dilaudid  [Hydromorphone ] Itching   Losartan  Dermatitis   Methotrexate And Trimetrexate Rash   Roxicodone  [Oxycodone ] Itching    Takes Percocet without issue

## 2024-05-30 NOTE — Assessment & Plan Note (Signed)
 Hemoglobin stable continue to monitor ?

## 2024-05-30 NOTE — H&P (Signed)
 "  NAME:  Ashley Freeman, MRN:  [id number], DOB:  Jun 08, 1990, LOS: 0 ADMISSION DATE:  05/30/2024, CONSULTATION DATE:  05/30/24 REFERRING MD:  EDP, CHIEF COMPLAINT:  pain   History of Present Illness:  34 year old woman w/ uncontrolled DM, HF, ESRD, variety of transportation/compliance issues, leading to recurrent DKA admissions, recent issues with ascites presenting with pain after leaving hospital 3 days ago AMA as her demands for fentanyl  and dilaudid  were not met.  A paracentesis was performed 1/25 with removal of 4L. She returns now with the same scenario, her abdomen is again distended and generally painful. She reports this is similar to the pain she has whenever she is distended. Workup in ER reveals potassium 7.9 and a glucose in excess of 500 without an anion gap acidosis upon presentation. The patient is agreeable to CRRT this evening, as long as we manage her pain.   Unfortunately due to HD staffing shortages patient will require ICU admission for CRRT.  Pertinent  Medical History   Past Medical History:  Diagnosis Date   Abscess, gluteal, right 08/24/2013   Anemia 02/19/2012   Bartholin's gland abscess 09/19/2013   Bipolar disorder (HCC)    BV (bacterial vaginosis) 11/24/2015   Depression    Diabetes mellitus type I (HCC) 2001   Diagnosed at age 67 ; Type I   Diarrhea 05/30/2016   DKA (diabetic ketoacidoses) 08/19/2013   Also in 2018   ESRD (end stage renal disease) (HCC)    Gonorrhea 08/2011   Treated in 09/2011   HFrEF (heart failure with reduced ejection fraction) (HCC)    a. 2022 Echo: EF 40%; b. 10/2021 Echo: EF 55%; b. 07/2022 MV: No ischemia. EF 31%; c. 08/2022 Echo: EF 35%, mildly dil RV, sev TR.   History of trichomoniasis 05/31/2016   Hyperlipidemia 03/28/2016   Hypertension    NICM (nonischemic cardiomyopathy) (HCC)    Seizures (HCC)    Sepsis (HCC) 09/19/2013     Significant Hospital Events: Including procedures, antibiotic start and stop dates in  addition to other pertinent events   1/31 Admit to CCM for hyperK and potential CRRT  Interim History / Subjective:  1/31 Admit to CCM for hyperK and potential CRRT  Objective    Blood pressure (!) 160/88, pulse 97, temperature 98 F (36.7 C), temperature source Oral, resp. rate 14, weight 65.8 kg, SpO2 100%.       No intake or output data in the 24 hours ending 05/30/24 2037 Filed Weights   05/30/24 2002  Weight: 65.8 kg    Examination: General: Conversant and in no distress HENT: PERRL/EOMI, no scleral icterus Lungs: CTA Cardiovascular: RRR -m Abdomen: fluid wave generalized tenderness normal BS Extremities: Perhaps trace edema Neuro: AAO3 GCS 15, nonfocal  GU: Deferred   Resolved problem list   Assessment and Plan  Volume overloaded state of heart ESRD with hyperkalemia- temporizing measures given HFrEF, decompensated Hyperglycemia with preserved gap Ascites with prior paracentesis Leukocytosis improved from a few days ago Chronic anemia Hx of noncompliance Hx seizures on vimpat  Hx HTN Chronic abdominal pain related to recurrent DKA, pancreatitis etc  Insulin  gtt + potential for CRRT this evening  Trend acid/base for potential use of bicarb in shifting K Consider repeat therapeutic para in am Continue PTA vimpat   Labs   CBC: Recent Labs  Lab 05/24/24 1748 05/24/24 1749 05/25/24 0301 05/30/24 1657 05/30/24 1828  WBC  --  9.6 18.7* 12.5*  --   NEUTROABS  --  3.9  6.0 5.0  --   HGB 10.9* 9.9* 9.7* 9.0* 10.2*  9.9*  HCT 32.0* 33.9* 32.4* 29.2* 30.0*  29.0*  MCV  --  91.4 89.0 88.2  --   PLT  --  230 233 250  --     Basic Metabolic Panel: Recent Labs  Lab 05/24/24 1749 05/24/24 2237 05/25/24 0301 05/25/24 1237 05/27/24 0924 05/30/24 1657 05/30/24 1828  NA 133*  --  137 139 139 137 137  136  K 5.3*  --  4.0 4.3 5.1 >7.5* 7.9*  7.9*  CL 94*  --  97* 99 100 103 106  CO2 23  --  22 26 27 22   --   GLUCOSE 822*  --  146* 21* 348* 511* 492*   BUN 23*  --  25* 26* 22* 45* 44*  CREATININE 4.50*  --  4.67* 4.71* 3.94* 6.60* 6.90*  CALCIUM  8.2*  --  8.6* 8.5* 8.7* 8.4*  --   MG  --  2.0 2.1  --   --  2.6*  --   PHOS  --   --  4.5  --   --   --   --    GFR: Estimated Creatinine Clearance: 10.8 mL/min (A) (by C-G formula based on SCr of 6.9 mg/dL (H)). Recent Labs  Lab 05/24/24 1749 05/24/24 2237 05/25/24 0301 05/30/24 1657  PROCALCITON  --  0.66  --   --   WBC 9.6  --  18.7* 12.5*    Liver Function Tests: Recent Labs  Lab 05/24/24 1749 05/25/24 0301 05/27/24 0924 05/30/24 1657  AST 133* 113* 39 107*  ALT 63* 66* 48* 49*  ALKPHOS 568* 577* 534* 465*  BILITOT 0.5 0.5 0.4 0.3  PROT 6.0* 6.2* 6.0* 5.2*  ALBUMIN  3.9 4.0 4.0 3.4*   Recent Labs  Lab 05/24/24 1749 05/25/24 0301  LIPASE 15 16   No results for input(s): AMMONIA in the last 168 hours.  ABG    Component Value Date/Time   PHART 7.223 (L) 04/23/2024 1659   PCO2ART 45.4 04/23/2024 1659   PO2ART 298 (H) 04/23/2024 1659   HCO3 25.0 05/30/2024 1828   TCO2 26 05/30/2024 1828   TCO2 23 05/30/2024 1828   ACIDBASEDEF 1.0 05/30/2024 1828   O2SAT 83 05/30/2024 1828     Coagulation Profile: Recent Labs  Lab 05/25/24 0301  INR 1.2    Cardiac Enzymes: No results for input(s): CKTOTAL, CKMB, CKMBINDEX, TROPONINI in the last 168 hours.  HbA1C: Hemoglobin A1C  Date/Time Value Ref Range Status  11/11/2013 04:31 AM 14.6 (H) 4.2 - 6.3 % Final    Comment:    The American Diabetes Association recommends that a primary goal of therapy should be <7% and that physicians should reevaluate the treatment regimen in patients with HbA1c values consistently >8%.   09/12/2011 02:53 AM SEE COMMENT 4.2 - 6.3 % Final    Comment:    The American Diabetes Association recommends that a primary goal of therapy should be <7% and that physicians should reevaluate the treatment regimen in patients with HbA1c values consistently >8%. HGB A1C - Unable to  perform testing at [facility] due  - to interfering substance. HGB A1C - H -- 16.8 %  - Reference Range: 4.8-5.6  - ----------------------------------------  - Increased risk for diabetes: 5.7-6.4  - Diabetes: >6.4  - Glycemic control for adults with  - .SABRA... diabetes: <7.0  - ----------------------------------------  - PERFORMED BY HOY[facility]ACOBS  - SPEC.NO.: [id number]  -  [address] [id number]  - ELSIE JULIANNA DROSS, MD  - [phone]    HbA1c, POC (controlled diabetic range)  Date/Time Value Ref Range Status  06/04/2018 09:29 AM   Final    Comment:    >15   Hgb A1c MFr Bld  Date/Time Value Ref Range Status  03/30/2024 03:34 AM 8.5 (H) 4.8 - 5.6 % Final    Comment:    (NOTE)         Prediabetes: 5.7 - 6.4         Diabetes: >6.4         Glycemic control for adults with diabetes: <7.0   12/20/2023 03:05 AM 10.8 (H) 4.8 - 5.6 % Final    Comment:    (NOTE) Diagnosis of Diabetes The following HbA1c ranges recommended by the American Diabetes Association (ADA) may be used as an aid in the diagnosis of diabetes mellitus.  Hemoglobin             Suggested A1C NGSP%              Diagnosis  <5.7                   Non Diabetic  5.7-6.4                Pre-Diabetic  >6.4                   Diabetic  <7.0                   Glycemic control for                       adults with diabetes.      CBG: Recent Labs  Lab 05/26/24 2014 05/27/24 0732 05/27/24 1204 05/27/24 1535 05/30/24 1920  GLUCAP 141* 312* 213* 165* 506*    Review of Systems:   Review of Systems  Constitutional:  Negative for chills, diaphoresis, fever and malaise/fatigue.  HENT:  Negative for ear discharge, ear pain, hearing loss, nosebleeds and tinnitus.   Eyes:  Negative for blurred vision, double vision, photophobia and pain.  Respiratory:  Negative for cough, hemoptysis, sputum production, shortness of breath and wheezing.   Cardiovascular:  Positive for leg swelling.  Negative for chest pain, palpitations, orthopnea, claudication and PND.  Gastrointestinal:  Positive for abdominal pain and heartburn. Negative for blood in stool and melena.  Genitourinary:  Negative for flank pain and hematuria.  Musculoskeletal:  Positive for myalgias. Negative for falls and joint pain.  Skin:  Positive for itching and rash.  Neurological:  Negative for dizziness, tingling, tremors, sensory change, focal weakness and headaches.     Past Medical History:  She,  has a past medical history of Abscess, gluteal, right (08/24/2013), Anemia (02/19/2012), Bartholin's gland abscess (09/19/2013), Bipolar disorder (HCC), BV (bacterial vaginosis) (11/24/2015), Depression, Diabetes mellitus type I (HCC) (2001), Diarrhea (05/30/2016), DKA (diabetic ketoacidoses) (08/19/2013), ESRD (end stage renal disease) (HCC), Gonorrhea (08/2011), HFrEF (heart failure with reduced ejection fraction) (HCC), History of trichomoniasis (05/31/2016), Hyperlipidemia (03/28/2016), Hypertension, NICM (nonischemic cardiomyopathy) (HCC), Seizures (HCC), and Sepsis (HCC) (09/19/2013).   Surgical History:   Past Surgical History:  Procedure Laterality Date   A/V FISTULAGRAM Right 06/17/2023   Procedure: A/V Fistulagram;  Surgeon: Marea Selinda RAMAN, MD;  Location: [facility] INVASIVE CV LAB;  Service: Cardiovascular;  Laterality: Right;   A/V SHUNT INTERVENTION N/A 09/25/2023   Procedure: A/V SHUNT INTERVENTION;  Surgeon: Pearline Gee  S, MD;  Location: [facility] PV LAB;  Service: Cardiovascular;  Laterality: N/A;   AV FISTULA PLACEMENT Right 07/06/2022   Procedure: ARTERIOVENOUS GRAFT CREATION;  Surgeon: Gretta Lonni PARAS, MD;  Location: [facility] OR;  Service: Vascular;  Laterality: Right;   CESAREAN SECTION N/A 10/05/2019   Procedure: CESAREAN SECTION;  Surgeon: Izell Harari, MD;  Location: [facility] LD ORS;  Service: Obstetrics;  Laterality: N/A;   CHOLECYSTECTOMY N/A 07/02/2023   Procedure: LAPAROSCOPIC CHOLECYSTECTOMY;  Surgeon:  Ebbie Cough, MD;  Location: [facility] OR;  Service: General;  Laterality: N/A;   ESOPHAGOGASTRODUODENOSCOPY N/A 01/31/2024   Procedure: EGD (ESOPHAGOGASTRODUODENOSCOPY);  Surgeon: San Sandor GAILS, DO;  Location: [facility] ENDOSCOPY;  Service: Gastroenterology;  Laterality: N/A;   INCISION AND DRAINAGE ABSCESS Left 09/28/2019   Procedure: INCISION AND DRAINAGE VULVAR ABCESS;  Surgeon: Edsel Norleen GAILS, MD;  Location: [facility] OR;  Service: Gynecology;  Laterality: Left;   INCISION AND DRAINAGE ABSCESS Right 02/23/2024   Procedure: INCISION AND DRAINAGE, ABSCESS;  Surgeon: Tobie Eldora NOVAK, MD;  Location: [facility] OR;  Service: ENT;  Laterality: Right;   INCISION AND DRAINAGE PERIRECTAL ABSCESS Right 08/18/2013   Procedure: IRRIGATION AND DEBRIDEMENT GLUTEAL ABSCESS;  Surgeon: Lynda Leos, MD;  Location: [facility] OR;  Service: General;  Laterality: Right;   INCISION AND DRAINAGE PERIRECTAL ABSCESS Right 09/19/2013   Procedure: IRRIGATION AND DEBRIDEMENT RIGHT GLUTEAL AND LABIAL ABSCESSES;  Surgeon: Lynda Leos, MD;  Location: [facility] OR;  Service: General;  Laterality: Right;   INCISION AND DRAINAGE PERIRECTAL ABSCESS Right 09/24/2013   Procedure: IRRIGATION AND DEBRIDEMENT PERIRECTAL ABSCESS;  Surgeon: Lynwood MALVA Pina, MD;  Location: [facility] OR;  Service: General;  Laterality: Right;   IR PARACENTESIS  08/28/2023   IR PARACENTESIS  11/04/2023   IR PARACENTESIS  12/23/2023   IR PARACENTESIS  02/04/2024   IR PARACENTESIS  03/23/2024   IR PARACENTESIS  04/06/2024   IR PARACENTESIS  05/27/2024     Social History:   reports that she has never smoked. She has never been exposed to tobacco smoke. She has never used smokeless tobacco. She reports that she does not currently use alcohol . She reports that she does not use drugs.   Family History:  Her family history includes Asthma in her mother; Carpal tunnel syndrome in her mother; Diabetes in her paternal grandmother; Gout in her father. There is no history of Anesthesia problems.    Allergies Allergies[1]   Home Medications  Prior to Admission medications  Medication Sig Start Date End Date Taking? Authorizing Provider  albuterol  (VENTOLIN  HFA) 108 (90 Base) MCG/ACT inhaler Inhale 2 puffs into the lungs every 4 (four) hours as needed for wheezing or shortness of breath. 11/24/22  Yes [provider]  bumetanide  (BUMEX ) 2 MG tablet Take 10 mg by mouth daily.   Yes [provider]  carvedilol  (COREG ) 25 MG tablet Take 1 tablet (25 mg total) by mouth 2 (two) times daily with a meal. Follow with your PCP for refills. 05/26/24 06/25/24 Yes Tobie Yetta HERO, MD  Cinnamon 500 MG capsule Take 500 mg by mouth in the morning.   Yes [provider]  diazePAM , 15 MG Dose, 2 x 7.5 MG/0.1ML LQPK Place 15 mg into the nose as needed (sz lasting over 2 minutes). 04/28/24  Yes Yadav, Priyanka O, MD  dicyclomine  (BENTYL ) 10 MG capsule Take 1 capsule (10 mg total) by mouth every 8 (eight) hours as needed for spasms. 05/26/24  Yes Tobie Yetta HERO, MD  DULoxetine  (CYMBALTA ) 20 MG capsule Take  1 capsule (20 mg total) by mouth daily. 05/04/24  Yes Ghimire, Donalda HERO, MD  Elderberry-Vitamin C-Zinc  (ELDERBERRY IMMUNE HEALTH GUMMY PO) Take 1 tablet by mouth daily.   Yes [provider]  hydrOXYzine  (ATARAX ) 25 MG tablet Take 1 tablet (25 mg total) by mouth 3 (three) times daily as needed for anxiety, itching, nausea or vomiting. 05/04/24  Yes Ghimire, Donalda HERO, MD  insulin  aspart (NOVOLOG ) 100 UNIT/ML FlexPen Inject 2 Units into the skin 3 (three) times daily with meals. 05/10/24  Yes Cindy Garnette POUR, MD  lacosamide  (VIMPAT ) 50 MG TABS tablet Take 2 tablets (100 mg total) by mouth 2 (two) times daily. Take additional 1 tablet (50 mg) after dialysis on Tuesdays, Thursdays and Saturdays 05/04/24  Yes Ghimire, Donalda HERO, MD  LANTUS  SOLOSTAR 100 UNIT/ML Solostar Pen Inject 5 Units into the skin daily. 04/18/24  Yes Perri DELENA Meliton Mickey., MD  methocarbamol  (ROBAXIN ) 500 MG  tablet Take 1 tablet (500 mg total) by mouth 4 (four) times daily for 5 days. 05/26/24 05/31/24 Yes Tobie Yetta HERO, MD  metoCLOPramide  (REGLAN ) 5 MG tablet Take 1 tablet (5 mg total) by mouth every 8 (eight) hours as needed for nausea. 05/26/24 05/26/25 Yes Tobie Yetta HERO, MD  OLANZapine  (ZYPREXA ) 5 MG tablet Take 1 tablet (5 mg total) by mouth at bedtime. 05/04/24  Yes Ghimire, Donalda HERO, MD  Pancrelipase , Lip-Prot-Amyl, 3000-9500 units CPEP Take 3,000 Units by mouth in the morning, at noon, and at bedtime.   Yes [provider]  pantoprazole  (PROTONIX ) 40 MG tablet Take 1 tablet (40 mg total) by mouth at bedtime. 05/26/24 06/09/24 Yes Tobie Yetta HERO, MD  sevelamer  carbonate (RENVELA ) 800 MG tablet Take 1 tablet (800 mg total) by mouth 3 (three) times daily with meals. 02/07/24  Yes Drusilla Sabas RAMAN, MD  SUMAtriptan  (IMITREX ) 50 MG tablet Take 50 mg by mouth every 2 (two) hours as needed for migraine or headache. 06/20/22  Yes [provider]  VELTASSA  16.8 g PACK Take 1 packet by mouth daily at 12 noon. 04/03/24  Yes [provider]     Critical care time: 50              [1]  Allergies Allergen Reactions   Keflex  [Cephalexin ] Anaphylaxis    Ceftriaxone  in the past with no reaction   Penicillins Anaphylaxis, Hives and Rash   Vibramycin  [Doxycycline ] Anaphylaxis   Benadryl  [Diphenhydramine ] Itching   Entresto  [Sacubitril -Valsartan ] Swelling    angioedema   Dilaudid  [Hydromorphone ] Itching   Losartan  Dermatitis   Methotrexate And Trimetrexate Rash   Roxicodone  [Oxycodone ] Itching    Takes Percocet without issue   "

## 2024-05-31 ENCOUNTER — Inpatient Hospital Stay (HOSPITAL_COMMUNITY)

## 2024-05-31 DIAGNOSIS — N186 End stage renal disease: Secondary | ICD-10-CM | POA: Diagnosis not present

## 2024-05-31 DIAGNOSIS — E875 Hyperkalemia: Secondary | ICD-10-CM

## 2024-05-31 DIAGNOSIS — Z992 Dependence on renal dialysis: Secondary | ICD-10-CM | POA: Diagnosis not present

## 2024-05-31 DIAGNOSIS — R188 Other ascites: Secondary | ICD-10-CM | POA: Diagnosis not present

## 2024-05-31 DIAGNOSIS — E1165 Type 2 diabetes mellitus with hyperglycemia: Secondary | ICD-10-CM

## 2024-05-31 LAB — GLUCOSE, CAPILLARY
Glucose-Capillary: 111 mg/dL — ABNORMAL HIGH (ref 70–99)
Glucose-Capillary: 117 mg/dL — ABNORMAL HIGH (ref 70–99)
Glucose-Capillary: 125 mg/dL — ABNORMAL HIGH (ref 70–99)
Glucose-Capillary: 189 mg/dL — ABNORMAL HIGH (ref 70–99)
Glucose-Capillary: 20 mg/dL — CL (ref 70–99)
Glucose-Capillary: 254 mg/dL — ABNORMAL HIGH (ref 70–99)
Glucose-Capillary: 345 mg/dL — ABNORMAL HIGH (ref 70–99)
Glucose-Capillary: 53 mg/dL — ABNORMAL LOW (ref 70–99)

## 2024-05-31 LAB — RENAL FUNCTION PANEL
Albumin: 3.4 g/dL — ABNORMAL LOW (ref 3.5–5.0)
Albumin: 3.4 g/dL — ABNORMAL LOW (ref 3.5–5.0)
Anion gap: 12 (ref 5–15)
Anion gap: 16 — ABNORMAL HIGH (ref 5–15)
BUN: 43 mg/dL — ABNORMAL HIGH (ref 6–20)
BUN: 32 mg/dL — ABNORMAL HIGH (ref 6–20)
CO2: 22 mmol/L (ref 22–32)
CO2: 21 mmol/L — ABNORMAL LOW (ref 22–32)
Calcium: 8.1 mg/dL — ABNORMAL LOW (ref 8.9–10.3)
Calcium: 8.9 mg/dL (ref 8.9–10.3)
Chloride: 106 mmol/L (ref 98–111)
Chloride: 100 mmol/L (ref 98–111)
Creatinine, Ser: 5.87 mg/dL — ABNORMAL HIGH (ref 0.44–1.00)
Creatinine, Ser: 4.13 mg/dL — ABNORMAL HIGH (ref 0.44–1.00)
GFR, Estimated: 9 mL/min — ABNORMAL LOW
GFR, Estimated: 14 mL/min — ABNORMAL LOW
Glucose, Bld: 122 mg/dL — ABNORMAL HIGH (ref 70–99)
Glucose, Bld: 370 mg/dL — ABNORMAL HIGH (ref 70–99)
Phosphorus: 5.9 mg/dL — ABNORMAL HIGH (ref 2.5–4.6)
Phosphorus: 4.2 mg/dL (ref 2.5–4.6)
Potassium: 6 mmol/L — ABNORMAL HIGH (ref 3.5–5.1)
Potassium: 5.2 mmol/L — ABNORMAL HIGH (ref 3.5–5.1)
Sodium: 141 mmol/L (ref 135–145)
Sodium: 137 mmol/L (ref 135–145)

## 2024-05-31 LAB — COMPREHENSIVE METABOLIC PANEL WITH GFR
ALT: 58 U/L — ABNORMAL HIGH (ref 0–44)
AST: 111 U/L — ABNORMAL HIGH (ref 15–41)
Albumin: 3.7 g/dL (ref 3.5–5.0)
Alkaline Phosphatase: 501 U/L — ABNORMAL HIGH (ref 38–126)
Anion gap: 14 (ref 5–15)
BUN: 48 mg/dL — ABNORMAL HIGH (ref 6–20)
CO2: 23 mmol/L (ref 22–32)
Calcium: 8.7 mg/dL — ABNORMAL LOW (ref 8.9–10.3)
Chloride: 104 mmol/L (ref 98–111)
Creatinine, Ser: 6.76 mg/dL — ABNORMAL HIGH (ref 0.44–1.00)
GFR, Estimated: 8 mL/min — ABNORMAL LOW
Glucose, Bld: 109 mg/dL — ABNORMAL HIGH (ref 70–99)
Potassium: 6.7 mmol/L (ref 3.5–5.1)
Sodium: 141 mmol/L (ref 135–145)
Total Bilirubin: 0.3 mg/dL (ref 0.0–1.2)
Total Protein: 5.5 g/dL — ABNORMAL LOW (ref 6.5–8.1)

## 2024-05-31 LAB — BASIC METABOLIC PANEL WITH GFR
Anion gap: 12 (ref 5–15)
Anion gap: 15 (ref 5–15)
BUN: 39 mg/dL — ABNORMAL HIGH (ref 6–20)
BUN: 34 mg/dL — ABNORMAL HIGH (ref 6–20)
CO2: 23 mmol/L (ref 22–32)
CO2: 22 mmol/L (ref 22–32)
Calcium: 8.3 mg/dL — ABNORMAL LOW (ref 8.9–10.3)
Calcium: 8.8 mg/dL — ABNORMAL LOW (ref 8.9–10.3)
Chloride: 105 mmol/L (ref 98–111)
Chloride: 102 mmol/L (ref 98–111)
Creatinine, Ser: 5.1 mg/dL — ABNORMAL HIGH (ref 0.44–1.00)
Creatinine, Ser: 4.52 mg/dL — ABNORMAL HIGH (ref 0.44–1.00)
GFR, Estimated: 11 mL/min — ABNORMAL LOW
GFR, Estimated: 13 mL/min — ABNORMAL LOW
Glucose, Bld: 178 mg/dL — ABNORMAL HIGH (ref 70–99)
Glucose, Bld: 296 mg/dL — ABNORMAL HIGH (ref 70–99)
Potassium: 5.6 mmol/L — ABNORMAL HIGH (ref 3.5–5.1)
Potassium: 5.5 mmol/L — ABNORMAL HIGH (ref 3.5–5.1)
Sodium: 140 mmol/L (ref 135–145)
Sodium: 140 mmol/L (ref 135–145)

## 2024-05-31 LAB — CBC
HCT: 25.7 % — ABNORMAL LOW (ref 36.0–46.0)
Hemoglobin: 8 g/dL — ABNORMAL LOW (ref 12.0–15.0)
MCH: 27.3 pg (ref 26.0–34.0)
MCHC: 31.1 g/dL (ref 30.0–36.0)
MCV: 87.7 fL (ref 80.0–100.0)
Platelets: 247 10*3/uL (ref 150–400)
RBC: 2.93 MIL/uL — ABNORMAL LOW (ref 3.87–5.11)
RDW: 16.4 % — ABNORMAL HIGH (ref 11.5–15.5)
WBC: 15.5 10*3/uL — ABNORMAL HIGH (ref 4.0–10.5)
nRBC: 0 % (ref 0.0–0.2)

## 2024-05-31 LAB — MRSA NEXT GEN BY PCR, NASAL: MRSA by PCR Next Gen: NOT DETECTED

## 2024-05-31 LAB — BETA-HYDROXYBUTYRIC ACID: Beta-Hydroxybutyric Acid: 0.11 mmol/L (ref 0.05–0.27)

## 2024-05-31 LAB — MAGNESIUM: Magnesium: 2.6 mg/dL — ABNORMAL HIGH (ref 1.7–2.4)

## 2024-05-31 LAB — APTT: aPTT: 26 s (ref 24–36)

## 2024-05-31 LAB — PHOSPHORUS: Phosphorus: 6.7 mg/dL — ABNORMAL HIGH (ref 2.5–4.6)

## 2024-05-31 MED ORDER — SENNA 8.6 MG PO TABS
1.0000 | ORAL_TABLET | Freq: Every day | ORAL | Status: DC
  Filled 2024-05-31: qty 1

## 2024-05-31 MED ORDER — FENTANYL CITRATE (PF) 50 MCG/ML IJ SOSY
25.0000 ug | PREFILLED_SYRINGE | Freq: Once | INTRAMUSCULAR | Status: AC
  Administered 2024-05-31: 25 ug via INTRAVENOUS
  Filled 2024-05-31: qty 1

## 2024-05-31 MED ORDER — DARBEPOETIN ALFA 100 MCG/0.5ML IJ SOSY
100.0000 ug | PREFILLED_SYRINGE | Freq: Once | INTRAMUSCULAR | Status: AC
  Administered 2024-05-31: 100 ug via SUBCUTANEOUS
  Filled 2024-05-31: qty 0.5

## 2024-05-31 MED ORDER — INSULIN GLARGINE 100 UNITS/ML SOLOSTAR PEN
5.0000 [IU] | PEN_INJECTOR | Freq: Every day | SUBCUTANEOUS | Status: DC
  Filled 2024-05-31: qty 3

## 2024-05-31 MED ORDER — OXYCODONE-ACETAMINOPHEN 5-325 MG PO TABS
1.0000 | ORAL_TABLET | Freq: Four times a day (QID) | ORAL | Status: DC | PRN
  Administered 2024-06-01: 1 via ORAL
  Filled 2024-05-31 (×2): qty 1

## 2024-05-31 MED ORDER — PRISMASOL BGK 2/3.5 32-2-3.5 MEQ/L EC SOLN: Status: DC

## 2024-05-31 MED ORDER — CHLORHEXIDINE GLUCONATE CLOTH 2 % EX PADS
6.0000 | MEDICATED_PAD | Freq: Every day | CUTANEOUS | Status: DC
  Administered 2024-05-31 – 2024-06-01 (×4): 6 via TOPICAL

## 2024-05-31 MED ORDER — FENTANYL CITRATE (PF) 50 MCG/ML IJ SOSY
25.0000 ug | PREFILLED_SYRINGE | Freq: Once | INTRAMUSCULAR | Status: AC
  Administered 2024-05-31: 25 ug via INTRAVENOUS

## 2024-05-31 MED ORDER — HYDRALAZINE HCL 20 MG/ML IJ SOLN
10.0000 mg | INTRAMUSCULAR | Status: DC | PRN
  Administered 2024-05-31 – 2024-06-01 (×3): 20 mg via INTRAVENOUS
  Filled 2024-05-31 (×3): qty 1

## 2024-05-31 MED ORDER — POLYETHYLENE GLYCOL 3350 17 G PO PACK
17.0000 g | PACK | Freq: Every day | ORAL | Status: DC
  Filled 2024-05-31: qty 1

## 2024-05-31 MED ORDER — INSULIN ASPART 100 UNIT/ML IJ SOLN
0.0000 [IU] | INTRAMUSCULAR | Status: DC
  Administered 2024-05-31: 15 [IU] via SUBCUTANEOUS
  Filled 2024-05-31: qty 15

## 2024-05-31 MED ORDER — HEPARIN SODIUM (PORCINE) 5000 UNIT/ML IJ SOLN
5000.0000 [IU] | Freq: Three times a day (TID) | INTRAMUSCULAR | Status: DC
  Administered 2024-05-31 – 2024-06-02 (×7): 5000 [IU] via SUBCUTANEOUS
  Filled 2024-05-31 (×9): qty 1

## 2024-05-31 MED ORDER — BISACODYL 10 MG RE SUPP
10.0000 mg | Freq: Once | RECTAL | Status: DC
  Filled 2024-05-31: qty 1

## 2024-05-31 MED ORDER — INSULIN GLARGINE-YFGN 100 UNIT/ML ~~LOC~~ SOLN
5.0000 [IU] | Freq: Every day | SUBCUTANEOUS | Status: DC
  Administered 2024-05-31: 5 [IU] via SUBCUTANEOUS
  Filled 2024-05-31 (×2): qty 0.05

## 2024-05-31 MED ORDER — FENTANYL CITRATE (PF) 50 MCG/ML IJ SOSY
25.0000 ug | PREFILLED_SYRINGE | INTRAMUSCULAR | Status: DC | PRN
  Administered 2024-05-31 – 2024-06-01 (×5): 25 ug via INTRAVENOUS
  Filled 2024-05-31 (×5): qty 1

## 2024-05-31 MED ORDER — SODIUM CHLORIDE 0.9% FLUSH
10.0000 mL | Freq: Two times a day (BID) | INTRAVENOUS | Status: DC
  Administered 2024-05-31 – 2024-06-02 (×4): 10 mL

## 2024-05-31 MED ORDER — SODIUM CHLORIDE 0.9% FLUSH: 10.0000 mL | INTRAVENOUS | Status: DC | PRN

## 2024-05-31 MED ORDER — HYDROMORPHONE HCL 1 MG/ML IJ SOLN: 0.2500 mg | Freq: Once | INTRAMUSCULAR | Status: DC

## 2024-05-31 NOTE — ED Provider Notes (Signed)
.  Critical Care  Performed by: Cottie Donnice PARAS, MD Authorized by: Cottie Donnice PARAS, MD   Critical care provider statement:    Critical care time (minutes):  30   Critical care was necessary to treat or prevent imminent or life-threatening deterioration of the following conditions:  Metabolic crisis   Critical care was time spent personally by me on the following activities:  Development of treatment plan with patient or surrogate, discussions with consultants, evaluation of patient's response to treatment, examination of patient, ordering and review of laboratory studies, ordering and review of radiographic studies, ordering and performing treatments and interventions, pulse oximetry, re-evaluation of patient's condition and review of old charts Comments:     Hyperkalemia management     Katilin Raynes, Donnice PARAS, MD 05/31/24 1140

## 2024-05-31 NOTE — TOC Initial Note (Signed)
 Transition of Care [facility]) - Initial/Assessment Note    Patient Details  Name: Ashley Freeman MRN: [id number] Date of Birth: Aug 22, 1990  [facility] CM/SW Contact:    Luise JAYSON Pan, LCSWA Phone Number: 05/31/2024, 10:19 AM  Clinical Narrative:  Patient recently discharged on 05/28/2024 and presented to the ED 1/31. Per previous assessment, patient lives with her mother and daughter. Patient has PCP, manages own medications, she does drive but doesn't have a car.   During previous admission, RNCM discussed frequent admissions and transportation issues to dialysis reported by patient. Patient states she was told by [employer] that she has been dropped from her Medicaid and no longer qualifies for transportation.    Inpatient Care Management (ICM) will continue to follow.   Expected Discharge Plan: Home/Self Care Barriers to Discharge: Continued Medical Work up   Patient Goals and CMS Choice Patient states their goals for this hospitalization and ongoing recovery are:: To return home          Expected Discharge Plan and Services   Discharge Planning Services: CM Consult Post Acute Care Choice: NA Living arrangements for the past 2 months: Apartment                   DME Agency: NA                  Prior Living Arrangements/Services Living arrangements for the past 2 months: Apartment Lives with:: Self, Minor Children, Parents Patient language and need for interpreter reviewed:: Yes Do you feel safe going back to the place where you live?: Yes      Need for Family Participation in Patient Care: Yes (Comment) Care giver support system in place?: Yes (comment)   Criminal Activity/Legal Involvement Pertinent to Current Situation/Hospitalization: No - Comment as needed  Activities of Daily Living   ADL Screening (condition at time of admission) Independently performs ADLs?: Yes (appropriate for developmental age) Is the patient deaf or  have difficulty hearing?: No Does the patient have difficulty seeing, even when wearing glasses/contacts?: No Does the patient have difficulty concentrating, remembering, or making decisions?: No  Permission Sought/Granted                  Emotional Assessment Appearance:: Appears older than stated age Attitude/Demeanor/Rapport: Unable to Assess Affect (typically observed): Unable to Assess Orientation: : Oriented to Self, Oriented to Place, Oriented to  Time, Oriented to Situation Alcohol  / Substance Use: Not Applicable Psych Involvement: No (comment)  Admission diagnosis:  Hyperkalemia [E87.5] Hyperglycemia [R73.9] Patient Active Problem List   Diagnosis Date Noted   Chronic abdominal pain 05/24/2024   History of bipolar disorder 05/24/2024   Severe tricuspid regurgitation 05/24/2024   History of essential hypertension 05/24/2024   Generalized weakness 05/01/2024   Ascites 04/23/2024   Seizure disorder (HCC) 04/13/2024   Acute on chronic diastolic heart failure (HCC) 03/22/2024   CAP (community acquired pneumonia) 03/22/2024   Diabetic ketoacidosis associated with type 1 diabetes mellitus (HCC) 03/16/2024   Neck abscess 02/22/2024   Sore throat 02/22/2024   HFrEF (heart failure with reduced ejection fraction) (HCC)    Chest pain 02/03/2024   Odynophagia 01/31/2024   Esophageal dysphagia 01/31/2024   Erosive esophagitis 01/31/2024   Hematemesis 01/26/2024   High anion gap metabolic acidosis 01/16/2024   Allergic rhinitis 01/16/2024   History of anemia due to chronic kidney disease 01/16/2024   Hypervolemia associated with renal insufficiency 12/19/2023   Pain and swelling of right upper extremity  12/19/2023   History of seizures 11/17/2023   Hyperosmolar hyperglycemic state (HHS) (HCC) 11/03/2023   Volume overload 11/03/2023   Vulvar pain 11/03/2023   Generalized abdominal pain 08/27/2023   Elevated LFTs 07/20/2023   Cholecystitis 06/30/2023   Prolonged QT  interval 06/30/2023   Bipolar disorder (HCC) 06/30/2023   Hyperglycemic crisis due to Type 1 diabetes mellitus (HCC) 05/20/2023   Acute on chronic HFrEF (heart failure with reduced ejection fraction) (HCC) 05/20/2023   Chronic combined systolic and diastolic heart failure (HCC) 05/20/2023   Generalized pain 05/20/2023   Hypoglycemia 09/08/2022   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) 05/26/2021   Dyslipidemia 05/26/2021   Anasarca 04/16/2021   Mild protein malnutrition 03/07/2021   DKA, type 1 (HCC) 12/08/2020   Type 1 diabetes mellitus (HCC) 02/08/2020   Transaminitis 10/05/2019   Leg edema, left 09/27/2019   Systolic ejection murmur 08/03/2019   Type 1 diabetes mellitus with hyperglycemia (HCC) 07/24/2019   Diabetic retinopathy (HCC) 07/02/2019   DM (diabetes mellitus), type 1 with renal complications (HCC) 05/21/2019   Diarrhea 05/31/2016   Diabetic neuropathy (HCC) 01/16/2016   Preop cardiovascular exam 08/24/2013   Pseudohyponatremia 08/04/2012   Hypokalemia 05/07/2012   Lactic acidosis 02/19/2012   Leukocytosis 02/19/2012   Normocytic anemia 02/19/2012   Hyperkalemia 02/19/2012   Hyperglycemia 11/06/2011   Stable proliferative diabetic retinopathy associated with type 1 diabetes mellitus (HCC)  10/13/1997   PCP:  Keven Crumbly Pap, MD Pharmacy:   [facility] [place] [address] [place] [address] Phone: [phone] Fax: [phone]  [facility] [address] [place] [address] Phone: [phone] Fax: [phone]  [place] [facility] [address] [place] [address] Phone: [phone] Fax: [phone]     Social Drivers of Health (SDOH) Social History: SDOH Screenings   Food Insecurity: No Food Insecurity (05/25/2024)  Housing: Low Risk (05/25/2024)  Transportation Needs: No Transportation Needs (05/25/2024)  Utilities: Not At Risk (05/25/2024)  Financial Resource Strain: Medium Risk (05/13/2024)   [facility] from [facility]  Physical Activity: Insufficiently Active (03/02/2022)   Received from [facility]  Social Connections: Moderately Isolated (05/03/2024)  Stress: No Stress Concern Present (03/08/2023)   Received from [facility]  Tobacco Use: Low Risk (05/30/2024)   SDOH Interventions:     Readmission Risk Interventions    03/23/2024    2:19 PM 02/06/2024    2:01 PM 11/28/2023   11:49 AM  Readmission Risk Prevention Plan  Transportation Screening Complete Complete Complete  Medication Review Oceanographer) Complete Referral to Pharmacy Complete  PCP or Specialist appointment within 3-5 days of discharge Complete Complete Complete  HRI or Home Care Consult Complete Complete Complete  SW Recovery Care/Counseling Consult  Complete  Palliative Care Screening Not Applicable Not Applicable Not Applicable  Skilled Nursing Facility Not Applicable Not Applicable Not Applicable

## 2024-05-31 NOTE — Procedures (Cosign Needed)
 Central Venous Catheter Insertion Procedure Note  Ashley Freeman  [id number]  02-08-1991  Date:05/31/24  Time:1:31 AM   Provider Performing:Lilinoe Acklin H Ketzia Guzek   Procedure: Insertion of Non-tunneled Central Venous Catheter(36556)with US  guidance (23062)    Indication(s) Hemodialysis  Consent Risks of the procedure as well as the alternatives and risks of each were explained to the patient and/or caregiver.  Consent for the procedure was obtained and is signed in the bedside chart  Anesthesia Topical only with 1% lidocaine    Timeout Verified patient identification, verified procedure, site/side was marked, verified correct patient position, special equipment/implants available, medications/allergies/relevant history reviewed, required imaging and test results available.  Sterile Technique Maximal sterile technique including full sterile barrier drape, hand hygiene, sterile gown, sterile gloves, mask, hair covering, sterile ultrasound probe cover (if used).  Procedure Description Area of catheter insertion was cleaned with chlorhexidine  and draped in sterile fashion.   With real-time ultrasound guidance a HD catheter was placed into the right internal jugular vein.  Nonpulsatile blood flow and easy flushing noted in all ports.  The catheter was sutured in place and sterile dressing applied.  Complications/Tolerance None; patient tolerated the procedure well. Chest X-ray is ordered to verify placement for internal jugular or subclavian cannulation.  Chest x-ray is not ordered for femoral cannulation.  EBL Minimal  Specimen(s) None  Warren Shade, DNP, AGACNP-BC  Pulmonary & Critical Care  Please see [email or web address] for pager details.  From 7A-7P if no response, please call [phone]. After hours, please call ELink [phone].

## 2024-05-31 NOTE — Inpatient Diabetes Management (Signed)
 Inpatient Diabetes Program Recommendations  AACE/ADA: New Consensus Statement on Inpatient Glycemic Control (2015)  Target Ranges:  Prepandial:   less than 140 mg/dL      Peak postprandial:   less than 180 mg/dL (1-2 hours)      Critically ill patients:  140 - 180 mg/dL   Lab Results  Component Value Date   GLUCAP 189 (H) 05/31/2024   HGBA1C 8.5 (H) 03/30/2024    Review of Glycemic Control  Latest Reference Range & Units 05/30/24 22:12 05/30/24 23:17 05/31/24 03:53 05/31/24 08:13  Glucose-Capillary 70 - 99 mg/dL 761 (H) 841 (H) 874 (H) 189 (H)  (H): Data is abnormally high Diabetes history: Type 1 DM Outpatient Diabetes medications: Lantus  5 units every day Current orders for Inpatient glycemic control: none IV insulin  stopped- 2317-05/30/24  Inpatient Diabetes Program Recommendations:    Consider adding Novolog  0-6 units Q4H and Lantus /glargine 3 units every day to start now.   Patient is type 1 DM and has been without insulin  since IV insulin  stopped. Secure chat sent to MD.   Thanks, Tinnie Minus, MSN, RNC-OB Diabetes Coordinator [phone] (8a-5p)

## 2024-05-31 NOTE — Progress Notes (Signed)
 Hypoglycemic Event  CBG: 20  Treatment: D50 50 mL (25 gm)  Symptoms: Sweaty and Drowsy  Follow-up CBG: Time:2247 CBG Result:117  Possible Reasons for Event: Inadequate meal intake and Medication regimen: Long acting insulin   Comments/MD notified: Ema Rush, RN & Dr. Kassie Mink, [place] SAUNDERS

## 2024-05-31 NOTE — Progress Notes (Signed)
 [facility] NEPHROLOGY PROGRESS NOTE  Assessment/ Plan: Pt is a 34 y.o. yo female   with past medical history of uncontrolled diabetes, DKA, hypertension, dyslipidemia, seizure disorder, ESRD on HD, no outpatient HD unit presented with shortness of breath, generalized weakness, seen as a consultation for the management of ESRD and hyperkalemia.   # Severe hyperkalemia: Admitted to ICU for CRRT because of staffing shortages due to inclement weather.  Potassium level is improving therefore change CRRT prescription to 2K bath.  Monitor lab.    # ESRD: AV graft for the access.  Currently getting CRRT via HD line.  Plan to continue CRRT today to manage hyperkalemia, increase UF goal 250-200 cc an hour.  Planning to discontinue CRRT by tomorrow morning.  Patient stated that the arrangement for outpatient dialysis is in the process, need to contact renal navigator tomorrow morning.   # Hypertension: UF with HD, monitor BP.   # Anemia of ESRD: Hemoglobin dropped, no active bleeding.  Ordered a dose of Aranesp .  # Metabolic Bone Disease: Monitor labs,  Subjective: Seen and examined in ICU.  Tolerating CRRT well.  Labs improving.  Reports generalized body pain.  No nausea, vomiting, chest pain Objective Vital signs in last 24 hours: Vitals:   05/31/24 0800 05/31/24 0814 05/31/24 0900 05/31/24 1000  BP: (!) 164/87  (!) 166/86 (!) 185/96  Pulse: 95  92 88  Resp: (!) 31  13 16   Temp:  98.7 F (37.1 C)    TempSrc:  Oral    SpO2: 96%  96% (!) 88%  Weight:       Weight change:   Intake/Output Summary (Last 24 hours) at 05/31/2024 1032 Last data filed at 05/31/2024 1000 Gross per 24 hour  Intake 50 ml  Output 1514 ml  Net -1464 ml       Labs: RENAL PANEL Recent Labs  Lab 05/24/24 2237 05/25/24 0301 05/25/24 1237 05/27/24 0924 05/30/24 1657 05/30/24 1828 05/30/24 2036 05/31/24 0012 05/31/24 0359 05/31/24 0715  NA  --  137   < > 139 137 137  136 136 141 141 140  K   --  4.0   < > 5.1 >7.5* 7.9*  7.9* 7.4* 6.7* 6.0* 5.6*  CL  --  97*   < > 100 103 106 103 104 106 105  CO2  --  22   < > 27 22  --  17* 23 22 23   GLUCOSE  --  146*   < > 348* 511* 492* 481* 109* 122* 178*  BUN  --  25*   < > 22* 45* 44* 48* 48* 43* 39*  CREATININE  --  4.67*   < > 3.94* 6.60* 6.90* 6.79* 6.76* 5.87* 5.10*  CALCIUM   --  8.6*   < > 8.7* 8.4*  --  8.8* 8.7* 8.1* 8.3*  MG 2.0 2.1  --   --  2.6*  --   --  2.6*  --   --   PHOS  --  4.5  --   --   --   --   --  6.7* 5.9*  --   ALBUMIN   --  4.0  --  4.0 3.4*  --   --  3.7 3.4*  --    < > = values in this interval not displayed.    Liver Function Tests: Recent Labs  Lab 05/27/24 0924 05/30/24 1657 05/31/24 0012 05/31/24 0359  AST 39 107* 111*  --   ALT  48* 49* 58*  --   ALKPHOS 534* 465* 501*  --   BILITOT 0.4 0.3 0.3  --   PROT 6.0* 5.2* 5.5*  --   ALBUMIN  4.0 3.4* 3.7 3.4*   Recent Labs  Lab 05/24/24 1749 05/25/24 0301  LIPASE 15 16   No results for input(s): AMMONIA in the last 168 hours. CBC: Recent Labs    11/17/23 1927 11/18/23 0630 11/18/23 0822 11/26/23 0045 05/24/24 1749 05/25/24 0301 05/30/24 1657 05/30/24 1828 05/31/24 0358  HGB 10.2* 10.9*  --    < > 9.9* 9.7* 9.0* 10.2*  9.9* 8.0*  MCV 88.0 88.4  --    < > 91.4 89.0 88.2  --  87.7  VITAMINB12  --  1,177*  --   --   --   --   --   --   --   FOLATE  --  12.1  --   --   --   --   --   --   --   FERRITIN  --   --  2,586*  --   --   --   --   --   --   TIBC  --   --  283  --   --   --   --   --   --   IRON   --   --  126  --   --   --   --   --   --   RETICCTPCT 2.4  --   --   --   --   --   --   --   --    < > = values in this interval not displayed.    Cardiac Enzymes: No results for input(s): CKTOTAL, CKMB, CKMBINDEX, TROPONINI in the last 168 hours. CBG: Recent Labs  Lab 05/30/24 2051 05/30/24 2212 05/30/24 2317 05/31/24 0353 05/31/24 0813  GLUCAP 344* 238* 158* 125* 189*    Iron  Studies: No results for input(s):  IRON , TIBC, TRANSFERRIN, FERRITIN in the last 72 hours. Studies/Results: DG CHEST PORT 1 VIEW Result Date: 05/31/2024 EXAM: 1 VIEW XRAY OF THE CHEST 05/31/2024 01:22:00 AM COMPARISON: 05/24/2024 CLINICAL HISTORY: Encounter for central line placement. ICD10: Z45.2 Encounter for adjustment and management of vascular access device. FINDINGS: LINES, TUBES AND DEVICES: Right internal jugular central venous catheter in place with tip overlying the right atrium just distal to the Radioshack. LUNGS AND PLEURA: Low lung volumes. New patchy opacity in the right lung base may be due to atelectasis though pneumonia is not excluded. No pleural effusion. No pneumothorax. HEART AND MEDIASTINUM: Stable cardiomegaly. BONES AND SOFT TISSUES: No acute osseous abnormality. IMPRESSION: 1. Right internal jugular central venous catheter with tip overlying the right atrium. 2. New patchy opacity in the right lung base, possibly due to atelectasis, though pneumonia is not excluded. Electronically signed by: Norman Gatlin MD 05/31/2024 01:27 AM EST RP Workstation: [id number]   CT ABDOMEN PELVIS WO CONTRAST Result Date: 05/30/2024 EXAM: CT ABDOMEN AND PELVIS WITHOUT CONTRAST 05/30/2024 11:39:25 PM TECHNIQUE: CT of the abdomen and pelvis was performed without the administration of intravenous contrast. Multiplanar reformatted images are provided for review. Automated exposure control, iterative reconstruction, and/or weight-based adjustment of the mA/kV was utilized to reduce the radiation dose to as low as reasonably achievable. COMPARISON: CT abdomen and pelvis 03/30/2025. CLINICAL HISTORY: Abdominal pain, acute, nonlocalized. Acute, nonlocalized abdominal pain. FINDINGS: LOWER CHEST: Stable small right pleural effusion. LIVER: The liver is  mildly enlarged. GALLBLADDER AND BILE DUCTS: The gallbladder is surgically absent. No biliary ductal dilatation. SPLEEN: No acute abnormality. PANCREAS: No acute  abnormality. ADRENAL GLANDS: No acute abnormality. KIDNEYS, URETERS AND BLADDER: No stones in the kidneys or ureters. No hydronephrosis. No perinephric or periureteral stranding. Urinary bladder is unremarkable. GI AND BOWEL: Stomach demonstrates no acute abnormality. There is no bowel obstruction. The appendix appears within normal limits. There is a large amount of stool throughout the entire colon. PERITONEUM AND RETROPERITONEUM: Large volume ascites is again noted. No free air. VASCULATURE: Aorta is normal in caliber. Extensive peripheral vascular calcifications are present. LYMPH NODES: No lymphadenopathy. REPRODUCTIVE ORGANS: No acute abnormality. BONES AND SOFT TISSUES: Diffuse bony wall edema is again noted. No acute osseous abnormality. No focal soft tissue abnormality. IMPRESSION: 1. Large volume ascites and diffuse body wall edema. 2. Stable small right pleural effusion. 3. Mildly enlarged liver. 4. Large amount of stool throughout the entire colon. Electronically signed by: Greig Pique MD 05/30/2024 11:52 PM EST RP Workstation: [id number]    Medications: Infusions:  heparin  10,000 units/ 20 mL infusion syringe 350 Units/hr (05/31/24 0900)   prismasol  BGK 0/2.5 400 mL/hr at 05/31/24 0149   prismasol  BGK 0/2.5 400 mL/hr at 05/31/24 0148   PrismaSol  BGK 2/3.5 1,500 mL/hr at 05/31/24 0908    Scheduled Medications:  acetaminophen   1,000 mg Oral Once   bisacodyl   10 mg Rectal Once   carvedilol   25 mg Oral BID WC   Chlorhexidine  Gluconate Cloth  6 each Topical Daily   DULoxetine   20 mg Oral Daily   lacosamide   100 mg Oral BID   lipase/protease/amylase  12,000 Units Oral TID AC   methocarbamol   500 mg Oral QID   OLANZapine   5 mg Oral QHS   pantoprazole   40 mg Oral QHS   polyethylene glycol  17 g Oral Daily   prochlorperazine   10 mg Oral Once   senna  1 tablet Oral Daily   sevelamer  carbonate  800 mg Oral TID WC   sodium chloride  flush  10-40 mL Intracatheter Q12H    have reviewed  scheduled and prn medications.  Physical Exam: General:NAD, comfortable Heart:RRR, s1s2 nl Lungs:clear b/l, no crackle Abdomen:soft, Non-tender, non-distended Extremities:No edema Dialysis Access: AV graft has good thrill and bruit, temporary HD central line for CRRT   Torris House Prasad Krislyn Donnan 05/31/2024,10:32 AM  LOS: 1 day

## 2024-05-31 NOTE — Progress Notes (Signed)
 eLink Physician-Brief Progress Note Patient Name: Ashley Freeman DOB: 09/06/1990 MRN: [id number]   Date of Service  05/31/2024  HPI/Events of Note  ESRD, recurrent DKA, noncomplaince, HFrEF, new ascites here with HyperK, high CBG but nongap; limitations in HD staffing leading to ICU admit to consider CRRT   eICU Interventions  On camera, mentating, nontoxic, nonlabored breathing, tele looks okay  D/w ground team, plan as outlined in their note      Intervention Category Evaluation Type: New Patient Evaluation  Toribio JAYSON Sharps 05/31/2024, 12:32 AM

## 2024-05-31 NOTE — Plan of Care (Signed)
" °  Problem: Education: Goal: Ability to describe self-care measures that may prevent or decrease complications (Diabetes Survival Skills Education) will improve Outcome: Progressing Goal: Individualized Educational Video(s) Outcome: Progressing   Problem: Coping: Goal: Ability to adjust to condition or change in health will improve Outcome: Progressing   Problem: Fluid Volume: Goal: Ability to maintain a balanced intake and output will improve Outcome: Progressing   Problem: Health Behavior/Discharge Planning: Goal: Ability to identify and utilize available resources and services will improve Outcome: Progressing Goal: Ability to manage health-related needs will improve Outcome: Progressing   Problem: Metabolic: Goal: Ability to maintain appropriate glucose levels will improve Outcome: Progressing   Problem: Nutritional: Goal: Maintenance of adequate nutrition will improve Outcome: Progressing Goal: Progress toward achieving an optimal weight will improve Outcome: Progressing   Problem: Skin Integrity: Goal: Risk for impaired skin integrity will decrease Outcome: Progressing   Problem: Tissue Perfusion: Goal: Adequacy of tissue perfusion will improve Outcome: Progressing   Problem: Education: Goal: Ability to describe self-care measures that may prevent or decrease complications (Diabetes Survival Skills Education) will improve Outcome: Progressing Goal: Individualized Educational Video(s) Outcome: Progressing   Problem: Urinary Elimination: Goal: Ability to achieve and maintain adequate renal perfusion and functioning will improve Outcome: Progressing   "

## 2024-05-31 NOTE — Progress Notes (Signed)
 1945 Patient stated she feels her neck is swelling. Trialysis catheter present in R internal jugular. No stridor, tongue swelling, RR 19. Pt complaining of shortness of breath, pain 9/10 in neck. O2 sat 99% on room air. Placed on 2L  for pt comfort. Elink RN John notified. Dr. Kassie responded, sending ground team. Jamie Dagenhart, NP arrived to bedside and assessed. No new orders at this time. Pt resting in bed in no distress.

## 2024-05-31 NOTE — Progress Notes (Addendum)
 eLink Physician-Brief Progress Note Patient Name: Ashley Freeman DOB: February 19, 1991 MRN: [id number]   Date of Service  05/31/2024  HPI/Events of Note  Notified that patient was complaining of neck swelling and stiffness.  She has a new HD cath in the R internal jugular.  She is undergoing CRRT now with no issues with the lines.   eICU Interventions  Notified ground team to assess.  Give pain medication as ordered PRN.      Intervention Category Minor Interventions: Communication with other healthcare providers and/or family  Shanda Busman 05/31/2024, 7:52 PM  11:31 PM Notified of hypoglycemia with glucose at 20.  She was given 1 amp of D50 with glucose now at 117.  Pt had received 5 units of lantus  at 5pm.  She is currently on CRRT.  She is alert when spoken to but falls right back asleep.   Plan> Get ABG.  Continue close monitoring of glucose.  Lantus  discontinued.   11:59 PM Glucose again at 69, on CBG 53.   Plan> Start on D5NS at 40cc/hr.

## 2024-06-01 DIAGNOSIS — N186 End stage renal disease: Secondary | ICD-10-CM | POA: Diagnosis not present

## 2024-06-01 DIAGNOSIS — E10649 Type 1 diabetes mellitus with hypoglycemia without coma: Secondary | ICD-10-CM | POA: Diagnosis not present

## 2024-06-01 DIAGNOSIS — D631 Anemia in chronic kidney disease: Secondary | ICD-10-CM

## 2024-06-01 DIAGNOSIS — E1022 Type 1 diabetes mellitus with diabetic chronic kidney disease: Secondary | ICD-10-CM

## 2024-06-01 DIAGNOSIS — Z992 Dependence on renal dialysis: Secondary | ICD-10-CM | POA: Diagnosis not present

## 2024-06-01 DIAGNOSIS — I5020 Unspecified systolic (congestive) heart failure: Secondary | ICD-10-CM

## 2024-06-01 DIAGNOSIS — K8681 Exocrine pancreatic insufficiency: Secondary | ICD-10-CM

## 2024-06-01 DIAGNOSIS — G8929 Other chronic pain: Secondary | ICD-10-CM | POA: Diagnosis not present

## 2024-06-01 DIAGNOSIS — E875 Hyperkalemia: Secondary | ICD-10-CM | POA: Diagnosis not present

## 2024-06-01 DIAGNOSIS — E1010 Type 1 diabetes mellitus with ketoacidosis without coma: Secondary | ICD-10-CM | POA: Diagnosis not present

## 2024-06-01 DIAGNOSIS — K859 Acute pancreatitis without necrosis or infection, unspecified: Secondary | ICD-10-CM | POA: Diagnosis not present

## 2024-06-01 DIAGNOSIS — I132 Hypertensive heart and chronic kidney disease with heart failure and with stage 5 chronic kidney disease, or end stage renal disease: Secondary | ICD-10-CM | POA: Diagnosis not present

## 2024-06-01 LAB — GLUCOSE, CAPILLARY
Glucose-Capillary: 118 mg/dL — ABNORMAL HIGH (ref 70–99)
Glucose-Capillary: 124 mg/dL — ABNORMAL HIGH (ref 70–99)
Glucose-Capillary: 125 mg/dL — ABNORMAL HIGH (ref 70–99)
Glucose-Capillary: 129 mg/dL — ABNORMAL HIGH (ref 70–99)
Glucose-Capillary: 133 mg/dL — ABNORMAL HIGH (ref 70–99)
Glucose-Capillary: 153 mg/dL — ABNORMAL HIGH (ref 70–99)
Glucose-Capillary: 161 mg/dL — ABNORMAL HIGH (ref 70–99)
Glucose-Capillary: 237 mg/dL — ABNORMAL HIGH (ref 70–99)
Glucose-Capillary: 242 mg/dL — ABNORMAL HIGH (ref 70–99)
Glucose-Capillary: 254 mg/dL — ABNORMAL HIGH (ref 70–99)
Glucose-Capillary: 322 mg/dL — ABNORMAL HIGH (ref 70–99)
Glucose-Capillary: 35 mg/dL — CL (ref 70–99)
Glucose-Capillary: 50 mg/dL — ABNORMAL LOW (ref 70–99)
Glucose-Capillary: 54 mg/dL — ABNORMAL LOW (ref 70–99)
Glucose-Capillary: 57 mg/dL — ABNORMAL LOW (ref 70–99)
Glucose-Capillary: 68 mg/dL — ABNORMAL LOW (ref 70–99)
Glucose-Capillary: 73 mg/dL (ref 70–99)

## 2024-06-01 LAB — RENAL FUNCTION PANEL
Albumin: 3.2 g/dL — ABNORMAL LOW (ref 3.5–5.0)
Anion gap: 17 — ABNORMAL HIGH (ref 5–15)
BUN: 28 mg/dL — ABNORMAL HIGH (ref 6–20)
CO2: 20 mmol/L — ABNORMAL LOW (ref 22–32)
Calcium: 8.8 mg/dL — ABNORMAL LOW (ref 8.9–10.3)
Chloride: 103 mmol/L (ref 98–111)
Creatinine, Ser: 3.58 mg/dL — ABNORMAL HIGH (ref 0.44–1.00)
GFR, Estimated: 17 mL/min — ABNORMAL LOW
Glucose, Bld: 62 mg/dL — ABNORMAL LOW (ref 70–99)
Phosphorus: 3.8 mg/dL (ref 2.5–4.6)
Potassium: 4.4 mmol/L (ref 3.5–5.1)
Sodium: 139 mmol/L (ref 135–145)

## 2024-06-01 LAB — BLOOD GAS, ARTERIAL
Acid-Base Excess: 0.5 mmol/L (ref 0.0–2.0)
Bicarbonate: 25.4 mmol/L (ref 20.0–28.0)
O2 Saturation: 100 %
Patient temperature: 37
pCO2 arterial: 41 mmHg (ref 32–48)
pH, Arterial: 7.4 (ref 7.35–7.45)
pO2, Arterial: 126 mmHg — ABNORMAL HIGH (ref 83–108)

## 2024-06-01 LAB — MAGNESIUM: Magnesium: 2.2 mg/dL (ref 1.7–2.4)

## 2024-06-01 LAB — APTT: aPTT: 37 s — ABNORMAL HIGH (ref 24–36)

## 2024-06-01 MED ORDER — DEXTROSE-SODIUM CHLORIDE 5-0.9 % IV SOLN: INTRAVENOUS | Status: DC

## 2024-06-01 MED ORDER — OXYCODONE-ACETAMINOPHEN 5-325 MG PO TABS
1.0000 | ORAL_TABLET | Freq: Four times a day (QID) | ORAL | Status: DC | PRN
  Administered 2024-06-01 – 2024-06-02 (×4): 1 via ORAL
  Filled 2024-06-01 (×5): qty 1

## 2024-06-01 MED ORDER — INSULIN ASPART 100 UNIT/ML IJ SOLN
0.0000 [IU] | Freq: Three times a day (TID) | INTRAMUSCULAR | Status: DC
  Administered 2024-06-01: 2 [IU] via SUBCUTANEOUS
  Filled 2024-06-01: qty 2

## 2024-06-01 MED ORDER — SENNA 8.6 MG PO TABS: 1.0000 | ORAL_TABLET | Freq: Every evening | ORAL | Status: DC | PRN

## 2024-06-01 MED ORDER — RENA-VITE PO TABS
1.0000 | ORAL_TABLET | Freq: Every day | ORAL | Status: DC
  Administered 2024-06-01 – 2024-06-02 (×2): 1 via ORAL
  Filled 2024-06-01 (×2): qty 1

## 2024-06-01 MED ORDER — NEPRO/CARBSTEADY PO LIQD
237.0000 mL | Freq: Two times a day (BID) | ORAL | Status: DC
  Administered 2024-06-01: 237 mL via ORAL

## 2024-06-01 MED ORDER — DEXTROSE 5 % IV SOLN: INTRAVENOUS | Status: DC

## 2024-06-01 MED ORDER — POLYETHYLENE GLYCOL 3350 17 G PO PACK: 17.0000 g | PACK | Freq: Every day | ORAL | Status: DC | PRN

## 2024-06-01 MED ORDER — DEXTROSE 50 % IV SOLN
INTRAVENOUS | Status: AC
  Filled 2024-06-01: qty 50

## 2024-06-01 MED ORDER — FENTANYL CITRATE (PF) 50 MCG/ML IJ SOSY: 25.0000 ug | PREFILLED_SYRINGE | INTRAMUSCULAR | Status: DC | PRN

## 2024-06-01 NOTE — Progress Notes (Signed)
 Hypoglycemic Event  CBG: 54  Treatment: D50 50 mL (25 gm)  Symptoms: None  Follow-up CBG: Time:0502 CBG Result:153  Possible Reasons for Event: Medication regimen: Lantus   Comments/MD notified:    Tommas [place] SAUNDERS

## 2024-06-01 NOTE — Progress Notes (Signed)
 Hypoglycemic Event  CBG: 50  Treatment: D50 50 mL (25 gm)  Symptoms: Drowsy  Follow-up CBG: Time:0114 CBG Result:125  Possible Reasons for Event: Inadequate meal intake and Medication regimen: Lantus   Comments/MD notified:Elink notified    Tommas [place] SAUNDERS

## 2024-06-01 NOTE — Progress Notes (Signed)
 eLink Physician-Brief Progress Note Patient Name: Ashley Freeman DOB: 09-Apr-1991 MRN: [id number]   Date of Service  06/01/2024  HPI/Events of Note  Pt was started on D5 at 50 cc/hour for hypoglycemia earlier today. But CBG is now 254. Has been elevated last couple times. Is on a  diet  eICU Interventions  Stop D5 Check CBG every 2 hours x 4 times      Intervention Category Major Interventions: Hyperglycemia - active titration of insulin  therapy  Krystale Rinkenberger G Lalani Winkles 06/01/2024, 9:10 PM

## 2024-06-01 NOTE — Evaluation (Signed)
 Physical Therapy Brief Evaluation and Discharge Note Patient Details Name: Ashley Freeman MRN: [id number] DOB: 1991-03-11 Today's Date: 06/01/2024   History of Present Illness  34 y.o. F adm 05/30/24 with pain, hyperkalemia, ascites after leaving AMA 1/28 when demands for fentanyl  and dilaudid  not met. PMHx: ESRD on HD, bipolar disorder, recurrent hospitalizations for DKA (presumed noncompliance).  Clinical Impression  Pt pleasant and able to perform all transfers and gait without assist. Pt walking in hall and reports independence at baseline. Pt without further therapy needs at this time and encouraged to continue walking acutely, will sign off with pt in agreement       PT Assessment Patient does not need any further PT services  Assistance Needed at Discharge  None    Equipment Recommendations None recommended by PT  Recommendations for Other Services       Precautions/Restrictions Precautions Precautions: None        Mobility  Bed Mobility Rolling: Independent        Transfers Overall transfer level: Independent                      Ambulation/Gait Ambulation/Gait assistance: Independent Gait Distance (Feet): 300 Feet Assistive device: None Gait Pattern/deviations: WFL(Within Functional Limits) Gait Speed: Pace WFL    Home Activity Instructions    Stairs            Modified Rankin (Stroke Patients Only)        Balance Overall balance assessment: No apparent balance deficits (not formally assessed)                        Pertinent Vitals/Pain PT - Brief Vital Signs All Vital Signs Stable: Yes Pain Assessment Pain Assessment: No/denies pain     Home Living Family/patient expects to be discharged to:: Private residence Living Arrangements: Parent Available Help at Discharge: Family;Available 24 hours/day Home Environment: Level entry   Home Equipment: None        Prior Function Level of Independence:  Independent      UE/LE Assessment   UE ROM/Strength/Tone/Coordination: WFL    LE ROM/Strength/Tone/Coordination: [facility]      Communication   Communication Communication: No apparent difficulties     Cognition Overall Cognitive Status: Appears within functional limits for tasks assessed/performed       General Comments      Exercises     Assessment/Plan    PT Problem List         PT Visit Diagnosis Other abnormalities of gait and mobility (R26.89)    No Skilled PT All education completed;Patient is independent with all acitivity/mobility   Co-evaluation                AMPAC 6 Clicks Help needed turning from your back to your side while in a flat bed without using bedrails?: None Help needed moving from lying on your back to sitting on the side of a flat bed without using bedrails?: None Help needed moving to and from a bed to a chair (including a wheelchair)?: None Help needed standing up from a chair using your arms (e.g., wheelchair or bedside chair)?: None Help needed to walk in hospital room?: None Help needed climbing 3-5 steps with a railing? : None 6 Click Score: 24      End of Session   Activity Tolerance: Patient tolerated treatment well Patient left: in chair;with call bell/phone within reach Nurse Communication: Mobility status PT Visit Diagnosis: Other  abnormalities of gait and mobility (R26.89)     Time: 8799-8786 PT Time Calculation (min) (ACUTE ONLY): 13 min  Charges:   PT Evaluation $PT Eval Low Complexity: 1 Low      Altair Appenzeller P, PT Acute Rehabilitation Services Office: [phone]   Lenoard KATHEE Docker  06/01/2024, 12:34 PM

## 2024-06-01 NOTE — Inpatient Diabetes Management (Signed)
 Inpatient Diabetes Program Recommendations  AACE/ADA: New Consensus Statement on Inpatient Glycemic Control   Target Ranges:  Prepandial:   less than 140 mg/dL      Peak postprandial:   less than 180 mg/dL (1-2 hours)      Critically ill patients:  140 - 180 mg/dL   Lab Results  Component Value Date   GLUCAP 118 (H) 06/01/2024   HGBA1C 8.5 (H) 03/30/2024    Latest Reference Range & Units 06/01/24 01:14 06/01/24 02:50 06/01/24 03:09 06/01/24 04:41 06/01/24 05:02 06/01/24 06:10 06/01/24 07:17 06/01/24 08:28  Glucose-Capillary 70 - 99 mg/dL 874 (H) 35 (LL) 866 (H) 54 (L) 153 (H) 73 68 (L) 118 (H)   Review of Glycemic Control  Diabetes history: Type 1 DM  Outpatient Diabetes medications:  Novolog  2 units TID  Lantus  5 units every day  Current orders for Inpatient glycemic control: none Novolog  0-6 units TID   Inpatient Diabetes Program Recommendations:   Noted:  - Hypoglycemia  - Patient has Type 1 diabetes   Please consider:  - Change correction to Novolog  0-6 units Q4HRS.   Thanks,  Lavanda Search, RN, MSN, [facility]  Inpatient Diabetes Coordinator  Pager [phone] (8a-5p)

## 2024-06-01 NOTE — Progress Notes (Signed)
 CRRT stopped. Changed filter once but return line had negative pressure. Return line flushes well but no blood return noted.   Placed IV team consult for possible TNK.

## 2024-06-02 DIAGNOSIS — E8770 Fluid overload, unspecified: Secondary | ICD-10-CM | POA: Diagnosis not present

## 2024-06-02 DIAGNOSIS — N289 Disorder of kidney and ureter, unspecified: Secondary | ICD-10-CM | POA: Diagnosis not present

## 2024-06-02 DIAGNOSIS — E1165 Type 2 diabetes mellitus with hyperglycemia: Secondary | ICD-10-CM | POA: Diagnosis not present

## 2024-06-02 DIAGNOSIS — E875 Hyperkalemia: Secondary | ICD-10-CM | POA: Diagnosis not present

## 2024-06-02 LAB — CBC
HCT: 28.1 % — ABNORMAL LOW (ref 36.0–46.0)
Hemoglobin: 8.6 g/dL — ABNORMAL LOW (ref 12.0–15.0)
MCH: 27 pg (ref 26.0–34.0)
MCHC: 30.6 g/dL (ref 30.0–36.0)
MCV: 88.4 fL (ref 80.0–100.0)
Platelets: 218 10*3/uL (ref 150–400)
RBC: 3.18 MIL/uL — ABNORMAL LOW (ref 3.87–5.11)
RDW: 16.5 % — ABNORMAL HIGH (ref 11.5–15.5)
WBC: 14.2 10*3/uL — ABNORMAL HIGH (ref 4.0–10.5)
nRBC: 0 % (ref 0.0–0.2)

## 2024-06-02 LAB — RENAL FUNCTION PANEL
Albumin: 3.7 g/dL (ref 3.5–5.0)
Anion gap: 14 (ref 5–15)
BUN: 34 mg/dL — ABNORMAL HIGH (ref 6–20)
CO2: 23 mmol/L (ref 22–32)
Calcium: 8.7 mg/dL — ABNORMAL LOW (ref 8.9–10.3)
Chloride: 104 mmol/L (ref 98–111)
Creatinine, Ser: 4.31 mg/dL — ABNORMAL HIGH (ref 0.44–1.00)
GFR, Estimated: 13 mL/min — ABNORMAL LOW
Glucose, Bld: 190 mg/dL — ABNORMAL HIGH (ref 70–99)
Phosphorus: 4.7 mg/dL — ABNORMAL HIGH (ref 2.5–4.6)
Potassium: 4.8 mmol/L (ref 3.5–5.1)
Sodium: 141 mmol/L (ref 135–145)

## 2024-06-02 LAB — GLUCOSE, CAPILLARY
Glucose-Capillary: 354 mg/dL — ABNORMAL HIGH (ref 70–99)
Glucose-Capillary: 367 mg/dL — ABNORMAL HIGH (ref 70–99)
Glucose-Capillary: 176 mg/dL — ABNORMAL HIGH (ref 70–99)
Glucose-Capillary: 194 mg/dL — ABNORMAL HIGH (ref 70–99)
Glucose-Capillary: 119 mg/dL — ABNORMAL HIGH (ref 70–99)
Glucose-Capillary: 123 mg/dL — ABNORMAL HIGH (ref 70–99)
Glucose-Capillary: 145 mg/dL — ABNORMAL HIGH (ref 70–99)
Glucose-Capillary: 165 mg/dL — ABNORMAL HIGH (ref 70–99)
Glucose-Capillary: 249 mg/dL — ABNORMAL HIGH (ref 70–99)

## 2024-06-02 MED ORDER — INSULIN ASPART 100 UNIT/ML IJ SOLN
0.0000 [IU] | Freq: Three times a day (TID) | INTRAMUSCULAR | Status: DC
  Administered 2024-06-02: 2 [IU] via SUBCUTANEOUS
  Administered 2024-06-02: 1 [IU] via SUBCUTANEOUS
  Administered 2024-06-03: 3 [IU] via SUBCUTANEOUS
  Administered 2024-06-03: 2 [IU] via SUBCUTANEOUS
  Filled 2024-06-02: qty 1
  Filled 2024-06-02 (×2): qty 2
  Filled 2024-06-02: qty 3

## 2024-06-02 MED ORDER — INSULIN ASPART 100 UNIT/ML IJ SOLN
0.0000 [IU] | Freq: Every day | INTRAMUSCULAR | Status: DC
  Administered 2024-06-02: 2 [IU] via SUBCUTANEOUS
  Filled 2024-06-02: qty 3

## 2024-06-02 MED ORDER — INSULIN ASPART 100 UNIT/ML IJ SOLN
INTRAMUSCULAR | Status: AC
  Filled 2024-06-02: qty 5

## 2024-06-02 MED ORDER — INSULIN ASPART 100 UNIT/ML IJ SOLN
4.0000 [IU] | Freq: Once | INTRAMUSCULAR | Status: AC
  Administered 2024-06-02: 4 [IU] via SUBCUTANEOUS
  Filled 2024-06-02: qty 4

## 2024-06-02 MED ORDER — MORPHINE SULFATE (PF) 2 MG/ML IV SOLN
2.0000 mg | Freq: Once | INTRAVENOUS | Status: AC
  Administered 2024-06-02: 2 mg via INTRAVENOUS
  Filled 2024-06-02: qty 1

## 2024-06-02 MED ORDER — CHLORHEXIDINE GLUCONATE CLOTH 2 % EX PADS
6.0000 | MEDICATED_PAD | Freq: Every day | CUTANEOUS | Status: DC
  Administered 2024-06-02: 6 via TOPICAL

## 2024-06-02 MED ORDER — MORPHINE SULFATE (PF) 2 MG/ML IV SOLN
2.0000 mg | Freq: Once | INTRAVENOUS | Status: AC | PRN
  Administered 2024-06-02: 2 mg via INTRAVENOUS
  Filled 2024-06-02: qty 1

## 2024-06-02 MED ORDER — INSULIN ASPART 100 UNIT/ML IJ SOLN
0.0000 [IU] | Freq: Three times a day (TID) | INTRAMUSCULAR | Status: DC
  Administered 2024-06-02: 5 [IU] via SUBCUTANEOUS

## 2024-06-02 MED ORDER — INSULIN GLARGINE-YFGN 100 UNIT/ML ~~LOC~~ SOLN
5.0000 [IU] | Freq: Every day | SUBCUTANEOUS | Status: DC
  Administered 2024-06-02 – 2024-06-03 (×2): 5 [IU] via SUBCUTANEOUS
  Filled 2024-06-02 (×2): qty 0.05

## 2024-06-02 NOTE — Progress Notes (Signed)
 eLink Physician-Brief Progress Note Patient Name: KEYANI RIGDON DOB: 1990-09-07 MRN: [id number]   Date of Service  06/02/2024  HPI/Events of Note  Glucose now 354 D5 has been on hold for a few hours  eICU Interventions  Revised SSI that is previously ordered to start now May need to adjust dose as currently on the very sensitive scale Discussed with BSRN who will continue to monitor and inform eLink if adjustment needed     Intervention Category Intermediate Interventions: Hyperglycemia - evaluation and treatment  Damien T Kendarrius Tanzi 06/02/2024, 1:50 AM

## 2024-06-02 NOTE — Plan of Care (Signed)
  Problem: Education: Goal: Ability to describe self-care measures that may prevent or decrease complications (Diabetes Survival Skills Education) will improve Outcome: Progressing Goal: Individualized Educational Video(s) Outcome: Progressing   Problem: Coping: Goal: Ability to adjust to condition or change in health will improve Outcome: Progressing   Problem: Fluid Volume: Goal: Ability to maintain a balanced intake and output will improve Outcome: Progressing   Problem: Health Behavior/Discharge Planning: Goal: Ability to identify and utilize available resources and services will improve Outcome: Progressing Goal: Ability to manage health-related needs will improve Outcome: Progressing   Problem: Metabolic: Goal: Ability to maintain appropriate glucose levels will improve Outcome: Progressing   Problem: Nutritional: Goal: Maintenance of adequate nutrition will improve Outcome: Progressing Goal: Progress toward achieving an optimal weight will improve Outcome: Progressing   Problem: Skin Integrity: Goal: Risk for impaired skin integrity will decrease Outcome: Progressing   Problem: Tissue Perfusion: Goal: Adequacy of tissue perfusion will improve Outcome: Progressing   Problem: Education: Goal: Ability to describe self-care measures that may prevent or decrease complications (Diabetes Survival Skills Education) will improve Outcome: Progressing Goal: Individualized Educational Video(s) Outcome: Progressing

## 2024-06-02 NOTE — Progress Notes (Signed)
 eLink Physician-Brief Progress Note Patient Name: Ashley Freeman DOB: July 15, 1990 MRN: [id number]   Date of Service  06/02/2024  HPI/Events of Note  Patient complaining of 10/10 neck pain every since HD cath was pulled HD. No hematoma noted on bedside assessment. She has allergies to hydrocodone .  Patient states the only medications she does not react to morphine  and Fentanyl   eICU Interventions  Ordered a one time dose of morphine  2 mg IV Discussed with BSRN     Intervention Category Intermediate Interventions: Pain - evaluation and management  Damien DASEN Kerstyn Coryell 06/02/2024, 1:03 AM
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
## 2024-06-02 NOTE — Progress Notes (Signed)
 eLink Physician-Brief Progress Note Patient Name: JAMES LAFALCE DOB: November 30, 1990 MRN: [id number]   Date of Service  06/02/2024  HPI/Events of Note  CBG 367 after the 5 units given at 0154  eICU Interventions  Give an additional 4 unit insulin  aspart Increase scale to sensitive scale to include bedtime coverage Discussed with BSRN     Intervention Category Intermediate Interventions: Hyperglycemia - evaluation and treatment  Damien T Omolola Mittman 06/02/2024, 4:00 AM

## 2024-06-03 DIAGNOSIS — E8770 Fluid overload, unspecified: Secondary | ICD-10-CM | POA: Diagnosis not present

## 2024-06-03 DIAGNOSIS — E1165 Type 2 diabetes mellitus with hyperglycemia: Secondary | ICD-10-CM | POA: Diagnosis not present

## 2024-06-03 DIAGNOSIS — E875 Hyperkalemia: Secondary | ICD-10-CM | POA: Diagnosis not present

## 2024-06-03 DIAGNOSIS — N289 Disorder of kidney and ureter, unspecified: Secondary | ICD-10-CM | POA: Diagnosis not present

## 2024-06-03 LAB — GLUCOSE, CAPILLARY
Glucose-Capillary: 153 mg/dL — ABNORMAL HIGH (ref 70–99)
Glucose-Capillary: 135 mg/dL — ABNORMAL HIGH (ref 70–99)
Glucose-Capillary: 204 mg/dL — ABNORMAL HIGH (ref 70–99)
Glucose-Capillary: 176 mg/dL — ABNORMAL HIGH (ref 70–99)

## 2024-06-03 NOTE — Progress Notes (Signed)
 DISCHARGE NOTE HOME Ashley Freeman to be discharged Home per MD order. Discussed prescriptions and follow up appointments with the patient. Prescriptions given to patient; medication list explained in detail. Patient verbalized understanding.  Skin clean, dry and intact without evidence of skin break down, no evidence of skin tears noted. IV catheter discontinued intact. Site without signs and symptoms of complications. Dressing and pressure applied. Pt denies pain at the site currently. No complaints noted.  Patient free of lines, drains, and wounds.   An After Visit Summary (AVS) was printed and given to the patient. Patient escorted to d/c lounge. Walked c steady gait. HTN meds discussed. Pt reports BP always high, agreed to coreg  but wanted to leave as it is her birthday.  [name] A Proctor-Gann, RN

## 2024-06-03 NOTE — Plan of Care (Signed)
 " Problem: Education: Goal: Ability to describe self-care measures that may prevent or decrease complications (Diabetes Survival Skills Education) will improve Outcome: Not Progressing Goal: Individualized Educational Video(s) Outcome: Not Progressing   Problem: Coping: Goal: Ability to adjust to condition or change in health will improve Outcome: Not Progressing   Problem: Fluid Volume: Goal: Ability to maintain a balanced intake and output will improve Outcome: Not Progressing   Problem: Health Behavior/Discharge Planning: Goal: Ability to identify and utilize available resources and services will improve Outcome: Not Progressing Goal: Ability to manage health-related needs will improve Outcome: Not Progressing   Problem: Metabolic: Goal: Ability to maintain appropriate glucose levels will improve Outcome: Not Progressing   Problem: Nutritional: Goal: Maintenance of adequate nutrition will improve Outcome: Not Progressing Goal: Progress toward achieving an optimal weight will improve Outcome: Not Progressing   Problem: Skin Integrity: Goal: Risk for impaired skin integrity will decrease Outcome: Not Progressing   Problem: Tissue Perfusion: Goal: Adequacy of tissue perfusion will improve Outcome: Not Progressing   Problem: Education: Goal: Ability to describe self-care measures that may prevent or decrease complications (Diabetes Survival Skills Education) will improve Outcome: Not Progressing Goal: Individualized Educational Video(s) Outcome: Not Progressing   Problem: Cardiac: Goal: Ability to maintain an adequate cardiac output will improve Outcome: Not Progressing   Problem: Health Behavior/Discharge Planning: Goal: Ability to identify and utilize available resources and services will improve Outcome: Not Progressing Goal: Ability to manage health-related needs will improve Outcome: Not Progressing   Problem: Fluid Volume: Goal: Ability to achieve a  balanced intake and output will improve Outcome: Not Progressing   Problem: Metabolic: Goal: Ability to maintain appropriate glucose levels will improve Outcome: Not Progressing   Problem: Nutritional: Goal: Maintenance of adequate nutrition will improve Outcome: Not Progressing Goal: Maintenance of adequate weight for body size and type will improve Outcome: Not Progressing   Problem: Respiratory: Goal: Will regain and/or maintain adequate ventilation Outcome: Not Progressing   Problem: Urinary Elimination: Goal: Ability to achieve and maintain adequate renal perfusion and functioning will improve Outcome: Not Progressing   Problem: Education: Goal: Knowledge of General Education information will improve Description: Including pain rating scale, medication(s)/side effects and non-pharmacologic comfort measures Outcome: Not Progressing   Problem: Health Behavior/Discharge Planning: Goal: Ability to manage health-related needs will improve Outcome: Not Progressing   Problem: Clinical Measurements: Goal: Ability to maintain clinical measurements within normal limits will improve Outcome: Not Progressing Goal: Will remain free from infection Outcome: Not Progressing Goal: Diagnostic test results will improve Outcome: Not Progressing Goal: Respiratory complications will improve Outcome: Not Progressing Goal: Cardiovascular complication will be avoided Outcome: Not Progressing   Problem: Activity: Goal: Risk for activity intolerance will decrease Outcome: Not Progressing   Problem: Nutrition: Goal: Adequate nutrition will be maintained Outcome: Not Progressing   Problem: Coping: Goal: Level of anxiety will decrease Outcome: Not Progressing   Problem: Elimination: Goal: Will not experience complications related to bowel motility Outcome: Not Progressing Goal: Will not experience complications related to urinary retention Outcome: Not Progressing   Problem: Pain  Managment: Goal: General experience of comfort will improve and/or be controlled Outcome: Not Progressing   Problem: Safety: Goal: Ability to remain free from injury will improve Outcome: Not Progressing   Problem: Skin Integrity: Goal: Risk for impaired skin integrity will decrease Outcome: Not Progressing   Problem: Education: Goal: Knowledge of General Education information will improve Description: Including pain rating scale, medication(s)/side effects and non-pharmacologic comfort measures Outcome: Not Progressing   Problem:  Health Behavior/Discharge Planning: Goal: Ability to manage health-related needs will improve Outcome: Not Progressing   Problem: Clinical Measurements: Goal: Ability to maintain clinical measurements within normal limits will improve Outcome: Not Progressing Goal: Will remain free from infection Outcome: Not Progressing Goal: Diagnostic test results will improve Outcome: Not Progressing Goal: Respiratory complications will improve Outcome: Not Progressing Goal: Cardiovascular complication will be avoided Outcome: Not Progressing   Problem: Activity: Goal: Risk for activity intolerance will decrease Outcome: Not Progressing   "
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
## 2024-06-03 NOTE — Procedures (Signed)
 HD Note:  Some information was entered later than the data was gathered due to patient care needs. The stated time with the data is accurate.  Received patient in bed to unit.   Patient asleep.  She does follow requests to move, but does not interact.   Informed consent signed and in chart.   Access used: Right upper arm Access issues: None  Patient tolerated treatment well. Reviewed  the Welcome Sheet fro [facility] at [facility] [place] showing her days and times of treatment.  Patient glanced at it and stated she knew where it is.  She stated she did not have transportation to the facility  TX duration: 3.25 hours  Alert, without acute distress.  Total UF removed: 4000 ml  Hand-off given to patient's nurse.   Transported back to the room   Keisha Amer L. Lenon, RN Kidney Dialysis Unit.

## 2024-06-03 NOTE — Plan of Care (Signed)
 [facility]  Initial Hemodialysis Orders Dialysis center: [facility]  Patient's name: Ashley Freeman DOB: 1990/11/11 ESRD (Transfer from [facility])   Discharge diagnosis: Volume overload/hyperkalemia 2/2 missed dialysis Uncontrolled T1DM Medical noncompliance   Allergies: Allergies[1]  Dialysis Prescription: Dialysis Frequency: TIW Tx duration: 3:45  BFR: 400 DFR: 800 EDW: 57.5 kg   Dialyzer: 180NRe UF profile/Sodium modeling?: -- Dialysis Bath: 2 K 2 Ca  Dialysis access: Access type: R AVG  Date placed: -- Surgeon: -- Needle gauge: 15g x2   In Center Medications: Heparin  Dose: 1600 bolus    VDRA: per protocol Venofer: per protocol  Mircera: 100 mcg IV q 2 wks.  Next dose due: 2/10   Discharge labs: Hgb: 8.6 K+: 4.8  Ca: 8.7  Phos: 4.7 Alb: 3.7  Please draw monthly labs on arrival.  Additional notes/follow-up:  Maisie Ronnald Acosta PA-C      [1]  Allergies Allergen Reactions   Keflex  [Cephalexin ] Anaphylaxis    Ceftriaxone  in the past with no reaction   Penicillins Anaphylaxis, Hives and Rash   Vibramycin  [Doxycycline ] Anaphylaxis   Benadryl  [Diphenhydramine ] Itching   Entresto  [Sacubitril -Valsartan ] Swelling    angioedema   Dilaudid  [Hydromorphone ] Itching   Losartan  Dermatitis   Methotrexate And Trimetrexate Rash   Roxicodone  [Oxycodone ] Itching    Takes Percocet without issue

## 2024-06-03 NOTE — Progress Notes (Addendum)
 Pt has been approved a TTS 1040 chair time at [facility] [place]. Tentative start date 06/04/24. Would need to arrive 1015am for first day. Met at bedside with pt to discuss this, pt would not wake to sound of voice, tapped on arm, pt awoke after time, but would not respond to naviagtor or RN at this time to discuss this. schedule letter provided and left at bedside. Will inform care team including RN, social work, attending, and nephrology.   Forrest Jaroszewski Dialysis nav [phone]  Addendum 1128am  Care team updated. AVS updated. Will continue to assist.  Addendum 3:32 Pt is open to new schedule per HD RN, but needed transport. Per social work, pharmacologist up at this time, pt has transport to HD tomorrow and is agreeable. Contacted clinic [facility] to be advised of d/c today and arrival tomorrow. Contacted [facility] PA to send orders. No further support needed.

## 2024-06-03 NOTE — Discharge Summary (Signed)
 Physician Discharge Summary  Ashley Freeman FMW:[id number] DOB: 02-08-1991 DOA: 05/30/2024  PCP: Keven Crumbly Pap, MD  Admit date: 05/30/2024 Discharge date: 06/03/2024  Admitted From:  home  Disposition:  Home   Recommendations for Outpatient Follow-up:  Go to your dialysis center tomorrow as scheduled ,   Home Health:NA  Equipment/Devices:NA   Discharge Condition:Stable   CODE STATUS:Full code  Diet recommendation: low salt , low carb and renal diet   Discharge Summary: 34 year old with uncontrolled type 2 diabetes, ESRD, noncompliance, recurrent DKA and ascites came to the hospital with abdominal pain, distention during the snowstorm.  In the emergency room she was found to be fluid overloaded, potassium 7.9, blood glucose 500 without any anion gap.  Hemodialysis could not be arranged overnight, patient was started on CRRT with right IJ,  started on insulin  drip and was admitted to ICU.  Now stabilized.   Volume overload and severe hyperkalemia secondary to missed hemodialysis, ESRD on hemodialysis, noncompliant   Presented with potassium 7.9. Right internal jugular-CRRT-improvement-patient receiving regular dialysis today. Clinically improved. Able to have outpatient dialysis starting tomorrow with TTS schedule.   Uncontrolled type 1 diabetes: Presented with hyperglycemia.  Developed hypoglycemia.  Blood sugars better now.  Eating regular food.  Resume home dose of long-acting insulin  5 units and prandial insulin  2 units.     Chronic pain syndrome: On Cymbalta , methocarbamol , patient on oxycodone  at home that she can continue to use.     History of seizure disorder: On Vimpat , olanzapine  and sumatriptan .  Continue.  Medically stable to discharge home today after dialysis.  Fortunately she was able to secure outpatient dialysis sessions and hopefully this will prevent her need for readmissions.   Discharge Diagnoses:  Active Problems:   Hyperkalemia    Hyperglycemic crisis due to Type 1 diabetes mellitus (HCC)   Hypervolemia associated with renal insufficiency   ESRD on hemodialysis (HCC)   Ascites   Anemia due to chronic kidney disease   Seizure disorder [facility])   Essential hypertension   Dyslipidemia   Chronic combined systolic and diastolic heart failure (HCC)   Chronic abdominal pain    Discharge Instructions  Discharge Instructions     Diet renal/carb modified with fluid restriction   Complete by: As directed    Discharge instructions   Complete by: As directed    Go to outpatient dialysis center tomorrow as scheduled   Increase activity slowly   Complete by: As directed       Allergies as of 06/03/2024       Reactions   Keflex  [cephalexin ] Anaphylaxis   Ceftriaxone  in the past with no reaction   Penicillins Anaphylaxis, Hives, Rash   Vibramycin  [doxycycline ] Anaphylaxis   Benadryl  [diphenhydramine ] Itching   Entresto  [sacubitril -valsartan ] Swelling   angioedema   Dilaudid  [hydromorphone ] Itching   Losartan  Dermatitis   Methotrexate And Trimetrexate Rash   Roxicodone  [oxycodone ] Itching   Takes Percocet without issue        Medication List     STOP taking these medications    methocarbamol  500 MG tablet Commonly known as: ROBAXIN        TAKE these medications    albuterol  108 (90 Base) MCG/ACT inhaler Commonly known as: VENTOLIN  HFA Inhale 2 puffs into the lungs every 4 (four) hours as needed for wheezing or shortness of breath.   bumetanide  2 MG tablet Commonly known as: BUMEX  Take 10 mg by mouth daily.   carvedilol  25 MG tablet Commonly known as: COREG  Take 1 tablet (  25 mg total) by mouth 2 (two) times daily with a meal. Follow with your PCP for refills.   Cinnamon 500 MG capsule Take 500 mg by mouth in the morning.   dicyclomine  10 MG capsule Commonly known as: BENTYL  Take 1 capsule (10 mg total) by mouth every 8 (eight) hours as needed for spasms.   DULoxetine  20 MG capsule Commonly  known as: CYMBALTA  Take 1 capsule (20 mg total) by mouth daily.   ELDERBERRY IMMUNE HEALTH GUMMY PO Take 1 tablet by mouth daily.   hydrOXYzine  25 MG tablet Commonly known as: ATARAX  Take 1 tablet (25 mg total) by mouth 3 (three) times daily as needed for anxiety, itching, nausea or vomiting.   insulin  aspart 100 UNIT/ML FlexPen Commonly known as: NOVOLOG  Inject 2 Units into the skin 3 (three) times daily with meals.   lacosamide  50 MG Tabs tablet Commonly known as: VIMPAT  Take 2 tablets (100 mg total) by mouth 2 (two) times daily. Take additional 1 tablet (50 mg) after dialysis on Tuesdays, Thursdays and Saturdays   Lantus  SoloStar 100 UNIT/ML Solostar Pen Generic drug: insulin  glargine Inject 5 Units into the skin daily.   metoCLOPramide  5 MG tablet Commonly known as: Reglan  Take 1 tablet (5 mg total) by mouth every 8 (eight) hours as needed for nausea.   OLANZapine  5 MG tablet Commonly known as: ZYPREXA  Take 1 tablet (5 mg total) by mouth at bedtime.   Pancrelipase  (Lip-Prot-Amyl) 3000-9500 units Cpep Take 3,000 Units by mouth in the morning, at noon, and at bedtime.   pantoprazole  40 MG tablet Commonly known as: PROTONIX  Take 1 tablet (40 mg total) by mouth at bedtime.   sevelamer  carbonate 800 MG tablet Commonly known as: RENVELA  Take 1 tablet (800 mg total) by mouth 3 (three) times daily with meals.   SUMAtriptan  50 MG tablet Commonly known as: IMITREX  Take 50 mg by mouth every 2 (two) hours as needed for migraine or headache.   Valtoco  15 MG Dose 2 x 7.5 MG/0.1ML Lqpk Generic drug: diazePAM  (15 MG Dose) Place 15 mg into the nose as needed (sz lasting over 2 minutes).   Veltassa  16.8 g Pack Generic drug: Patiromer  Sorbitex Calcium  Take 1 packet by mouth daily at 12 noon.        Follow-up Information     Center, [facility]. Go on 06/04/2024.   Why: Please arrive 10:15am for first appontment on Thursday the 5th.   After this, you will  continue to go here tuesday, thurs, and sat at 10:40am. Contact information: [address] [place] [address] [phone]                Allergies[1]  Consultations: Critical care Nephrology   Procedures/Studies: DG CHEST PORT 1 VIEW Result Date: 05/31/2024 EXAM: 1 VIEW XRAY OF THE CHEST 05/31/2024 01:22:00 AM COMPARISON: 05/24/2024 CLINICAL HISTORY: Encounter for central line placement. ICD10: Z45.2 Encounter for adjustment and management of vascular access device. FINDINGS: LINES, TUBES AND DEVICES: Right internal jugular central venous catheter in place with tip overlying the right atrium just distal to the Radioshack. LUNGS AND PLEURA: Low lung volumes. New patchy opacity in the right lung base may be due to atelectasis though pneumonia is not excluded. No pleural effusion. No pneumothorax. HEART AND MEDIASTINUM: Stable cardiomegaly. BONES AND SOFT TISSUES: No acute osseous abnormality. IMPRESSION: 1. Right internal jugular central venous catheter with tip overlying the right atrium. 2. New patchy opacity in the right lung base, possibly  due to atelectasis, though pneumonia is not excluded. Electronically signed by: Norman Gatlin MD 05/31/2024 01:27 AM EST RP Workstation: [id number]   CT ABDOMEN PELVIS WO CONTRAST Result Date: 05/30/2024 EXAM: CT ABDOMEN AND PELVIS WITHOUT CONTRAST 05/30/2024 11:39:25 PM TECHNIQUE: CT of the abdomen and pelvis was performed without the administration of intravenous contrast. Multiplanar reformatted images are provided for review. Automated exposure control, iterative reconstruction, and/or weight-based adjustment of the mA/kV was utilized to reduce the radiation dose to as low as reasonably achievable. COMPARISON: CT abdomen and pelvis 03/30/2025. CLINICAL HISTORY: Abdominal pain, acute, nonlocalized. Acute, nonlocalized abdominal pain. FINDINGS: LOWER CHEST: Stable small right pleural effusion. LIVER: The liver is mildly  enlarged. GALLBLADDER AND BILE DUCTS: The gallbladder is surgically absent. No biliary ductal dilatation. SPLEEN: No acute abnormality. PANCREAS: No acute abnormality. ADRENAL GLANDS: No acute abnormality. KIDNEYS, URETERS AND BLADDER: No stones in the kidneys or ureters. No hydronephrosis. No perinephric or periureteral stranding. Urinary bladder is unremarkable. GI AND BOWEL: Stomach demonstrates no acute abnormality. There is no bowel obstruction. The appendix appears within normal limits. There is a large amount of stool throughout the entire colon. PERITONEUM AND RETROPERITONEUM: Large volume ascites is again noted. No free air. VASCULATURE: Aorta is normal in caliber. Extensive peripheral vascular calcifications are present. LYMPH NODES: No lymphadenopathy. REPRODUCTIVE ORGANS: No acute abnormality. BONES AND SOFT TISSUES: Diffuse bony wall edema is again noted. No acute osseous abnormality. No focal soft tissue abnormality. IMPRESSION: 1. Large volume ascites and diffuse body wall edema. 2. Stable small right pleural effusion. 3. Mildly enlarged liver. 4. Large amount of stool throughout the entire colon. Electronically signed by: Greig Pique MD 05/30/2024 11:52 PM EST RP Workstation: [id number]   IR Paracentesis Result Date: 05/27/2024 INDICATION: 34 year old female. History of end-stage renal disease on hemodialysis with recurrent ascites. Request is for therapeutic and diagnostic paracentesis. Maximum of 4 L EXAM: ULTRASOUND GUIDED THERAPEUTIC AND DIAGNOSTIC RIGHT SIDED PARACENTESIS MEDICATIONS: Lidocaine  1% 10 mL COMPLICATIONS: None immediate. PROCEDURE: Informed written consent was obtained from the patient after a discussion of the risks, benefits and alternatives to treatment. A timeout was performed prior to the initiation of the procedure. Initial ultrasound scanning demonstrates a moderate amount of ascites within the right lower abdominal quadrant. The right lower abdomen was prepped and draped  in the usual sterile fashion. 1% lidocaine  was used for local anesthesia. Following this, a 19 gauge, 7-cm, Yueh catheter was introduced. An ultrasound image was saved for documentation purposes. The paracentesis was performed. The catheter was removed and a dressing was applied. The patient tolerated the procedure well without immediate post procedural complication. FINDINGS: A total of approximately 4 L of pale yellow fluid was removed. Samples were sent to the laboratory as requested by the clinical team. IMPRESSION: Successful ultrasound-guided therapeutic and diagnostic left-sided paracentesis yielding 4 liters of pale yellow peritoneal fluid. Performed by: Greig Jasmine PA Electronically Signed   By: CHRISTELLA.  Shick M.D.   On: 05/27/2024 12:04   DG Abd Portable 1V Result Date: 05/27/2024 EXAM: 1 VIEW XRAY OF THE ABDOMEN 05/27/2024 08:00:03 AM COMPARISON: 05/07/2024 CLINICAL HISTORY: Nausea. FINDINGS: BOWEL: Nonobstructive bowel gas pattern. Moderate gastric distention. SOFT TISSUES: Right upper quadrant surgical clips noted. No abnormal calcifications. BONES: No acute fracture. IMPRESSION: 1. Moderate gastric distention. 2. No bowel obstruction Electronically signed by: Norleen Boxer MD 05/27/2024 08:16 AM EST RP Workstation: [id number]   DG Chest Port 1 View Result Date: 05/24/2024 EXAM: 1 VIEW XRAY OF THE CHEST 05/24/2024 07:46:41 PM COMPARISON: 05/20/2024  CLINICAL HISTORY: Upper abdominal pain. FINDINGS: LINES, TUBES AND DEVICES: Overlying wires noted. LUNGS AND PLEURA: Left perihilar opacities. Low lung volumes. No pleural effusion. No pneumothorax. HEART AND MEDIASTINUM: Cardiomegaly. BONES AND SOFT TISSUES: No acute osseous abnormality. IMPRESSION: 1. Left perihilar opacities. Electronically signed by: Elsie Gravely MD 05/24/2024 07:50 PM EST RP Workstation: [id number]   CT ABDOMEN PELVIS WO CONTRAST Result Date: 05/20/2024 EXAM: CT ABDOMEN AND PELVIS WITHOUT CONTRAST 05/20/2024 05:21:43 PM TECHNIQUE:  CT of the abdomen and pelvis was performed without the administration of intravenous contrast. Multiplanar reformatted images are provided for review. Automated exposure control, iterative reconstruction, and/or weight-based adjustment of the mA/kV was utilized to reduce the radiation dose to as low as reasonably achievable. COMPARISON: 03/08/2024 CLINICAL HISTORY: Abdominal pain, acute, nonlocalized; ab pain r/o SBO. Patient has hx of ESRD on HD. FINDINGS: LOWER CHEST: Small right and trace left pleural effusions. Patchy ground glass opacities in the lung bases, possibly atelectasis or developing edema. LIVER: The liver is unremarkable. GALLBLADDER AND BILE DUCTS: Cholecystectomy. No biliary ductal dilatation. SPLEEN: No acute abnormality. PANCREAS: No acute abnormality. ADRENAL GLANDS: No acute abnormality. KIDNEYS, URETERS AND BLADDER: Renal cortical atrophy bilaterally, consistent with chronic medical renal disease. No stones in the kidneys or ureters. No hydronephrosis. No perinephric or periureteral stranding. Plate decompression of the urinary bladder. GI AND BOWEL: Stomach demonstrates no acute abnormality. Gas filled nondistended appendix. There is no bowel obstruction. PERITONEUM AND RETROPERITONEUM: Large volume ascites. No free air. VASCULATURE: Aorta is normal in caliber. Diffuse [place] calcification throughout the mesenteric vessels. LYMPH NODES: No lymphadenopathy. REPRODUCTIVE ORGANS: The uterus and ovaries are within normal limits for patients age. BONES AND SOFT TISSUES: No acute osseous abnormality. Severe, diffuse anasarca. IMPRESSION: 1. No acute intraabdominal or pelvic abnormality. 2. Severe, diffuse anasarca and large volume ascites with small right and trace left pleural effusions, likely related to the patient's volume status. 3. Patchy ground glass opacities in the lung bases, possibly representing atelectasis or developing pulmonary edema. Electronically signed by: Rogelia Myers MD  05/20/2024 05:33 PM EST RP Workstation: [id number]   DG Chest Port 1 View Result Date: 05/20/2024 EXAM: 1 VIEW(S) XRAY OF THE CHEST 05/20/2024 05:02:04 PM COMPARISON: 05/12/2024 CLINICAL HISTORY: SOB SOB SOB SOB SOB FINDINGS: LINES, TUBES AND DEVICES: Cardiac leads in place. LUNGS AND PLEURA: Underinflation. No focal pulmonary opacity. No pleural effusion. No pneumothorax. HEART AND MEDIASTINUM: Cardiomegaly. BONES AND SOFT TISSUES: No acute osseous abnormality. IMPRESSION: 1. Cardiomegaly. 2. No acute findings. Electronically signed by: Greig Pique MD 05/20/2024 05:10 PM EST RP Workstation: [id number]   DG Chest Port 1 View Result Date: 05/12/2024 EXAM: 1 VIEW(S) XRAY OF THE CHEST 05/12/2024 12:16:48 AM COMPARISON: 05/06/2024 CLINICAL HISTORY: Hyperglycemia FINDINGS: LUNGS AND PLEURA: Low lung volumes. No focal pulmonary opacity. No pleural effusion. No pneumothorax. HEART AND MEDIASTINUM: Cardiomegaly. BONES AND SOFT TISSUES: No acute osseous abnormality. IMPRESSION: 1. Cardiomegaly. Electronically signed by: Oneil Devonshire MD MD 05/12/2024 12:25 AM EST RP Workstation: [id number] View Result Date: 05/07/2024 EXAM: 1 VIEW XRAY OF THE ABDOMEN 05/07/2024 05:44:00 PM COMPARISON: 05/06/2024 CLINICAL HISTORY: Ileus (HCC) FINDINGS: BOWEL: Gas filled loops of nondilated small bowel with scattered gas in the cecum and transverse colon. This appearance is nonobstructive but raises the possibility adynamic small bowel ileus or enteritis. SOFT TISSUES: Cholecystectomy clips present. No abnormal calcifications. BONES: No acute fracture. IMPRESSION: 1. Possible adynamic ileus or enteritis. Electronically signed by: Pinkie Pebbles MD MD 05/07/2024 07:31 PM EST RP Workstation: [id number]  CT HEAD WO CONTRAST ( ) Result Date: 05/07/2024 EXAM: CT HEAD WITHOUT CONTRAST 05/07/2024 07:24:03 PM TECHNIQUE: CT of the head was performed without the administration of intravenous contrast. Automated exposure control,  iterative reconstruction, and/or weight based adjustment of the mA/kV was utilized to reduce the radiation dose to as low as reasonably achievable. COMPARISON: 04/13/2024 CLINICAL HISTORY: Mental status change, unknown cause. FINDINGS: BRAIN AND VENTRICLES: No acute hemorrhage. No evidence of acute infarct. No hydrocephalus. No extra-axial collection. No mass effect or midline shift. Intracranial atherosclerosis. ORBITS: No acute abnormality. SINUSES: No acute abnormality. SOFT TISSUES AND SKULL: No acute soft tissue abnormality. No skull fracture. IMPRESSION: 1. No acute intracranial abnormality. Electronically signed by: Pinkie Pebbles MD MD 05/07/2024 07:27 PM EST RP Workstation: [id number]   DG Abd 1 View Result Date: 05/06/2024 CLINICAL DATA:  Abdominal pain EXAM: ABDOMEN - 1 VIEW COMPARISON:  04/23/2024 FINDINGS: Mild gaseous distention of the stomach. No gaseous small bowel dilatation to suggest obstruction. Gas is visible in a nondilated splenic flexure of the colon. Surgical clips in the right upper quadrant suggest prior cholecystectomy. IMPRESSION: Mild gaseous distention of the stomach. No gaseous small bowel dilatation to suggest obstruction. Electronically Signed   By: Camellia Candle M.D.   On: 05/06/2024 06:53   DG Chest Port 1 View Result Date: 05/06/2024 CLINICAL DATA:  Cough EXAM: PORTABLE CHEST 1 VIEW COMPARISON:  04/30/2024, 04/24/2024 FINDINGS: Cardiomegaly. No focal airspace disease, pleural effusion or pneumothorax. Decreased vascular congestion and interstitial edema compared to prior. IMPRESSION: Cardiomegaly with decreased vascular congestion and interstitial edema compared to prior. Electronically Signed   By: Luke Bun M.D.   On: 05/06/2024 00:18   (Echo, Carotid, EGD, Colonoscopy, ERCP)    Subjective: Patient seen in the morning rounds.  She wanted to sleep.  Denied any complaints.   Discharge Exam: Vitals:   06/03/24 1246 06/03/24 1300  BP: 139/75 122/82  Pulse: 88  93  Resp: 14 13  Temp:    SpO2: 98% 94%   Vitals:   06/03/24 1200 06/03/24 1230 06/03/24 1246 06/03/24 1300  BP: 115/67 (!) 142/70 139/75 122/82  Pulse: 91 85 88 93  Resp: 15 12 14 13   Temp:      TempSrc:      SpO2: 97% 98% 98% 94%  Weight:        General: Pt is alert, awake, not in acute distress.  Frail.  Cushingoid. Cardiovascular: RRR, S1/S2 +, no rubs, no gallops Respiratory: CTA bilaterally, no wheezing, no rhonchi Abdominal: Soft, NT, mildly distended, no fluid thrill, bowel sounds + Extremities: no edema, no cyanosis, left upper extremities fistula present    The results of significant diagnostics from this hospitalization (including imaging, microbiology, ancillary and laboratory) are listed below for reference.     Microbiology: Recent Results (from the past 240 hours)  Body fluid culture w Gram Stain     Status: None   Collection Time: 05/27/24 11:34 AM   Specimen: Abdomen; Peritoneal Fluid  Result Value Ref Range Status   Specimen Description PERITONEAL  Final   Special Requests NONE  Final   Gram Stain NO WBC SEEN NO ORGANISMS SEEN   Final   Culture   Final    NO GROWTH 3 DAYS Performed at [facility], [address] [place], [place] [address]    Report Status 05/30/2024 FINAL  Final  MRSA Next Gen by PCR, Nasal     Status: None   Collection Time: 05/30/24 11:30 PM  Specimen: Nasal Mucosa; Nasal Swab  Result Value Ref Range Status   MRSA by PCR Next Gen NOT DETECTED NOT DETECTED Final    Comment: (NOTE) The GeneXpert MRSA Assay (FDA approved for NASAL specimens only), is one component of a comprehensive MRSA colonization surveillance program. It is not intended to diagnose MRSA infection nor to guide or monitor treatment for MRSA infections. Test performance is not FDA approved in patients less than 38 years old. Performed at [facility], [address] [place], [place] [address]      Labs: BNP (last 3 results) Recent Labs     01/04/24 0424 01/16/24 0608 03/21/24 2153  BNP >4,500.0* 4,044.0* >4,500.0*   Basic Metabolic Panel: Recent Labs  Lab 05/30/24 1657 05/30/24 1828 05/31/24 0012 05/31/24 0359 05/31/24 0715 05/31/24 1250 05/31/24 1630 06/01/24 0038 06/02/24 0929  NA 137   < > 141 141 140 140 137 139 141  K >7.5*   < > 6.7* 6.0* 5.6* 5.5* 5.2* 4.4 4.8  CL 103   < > 104 106 105 102 100 103 104  CO2 22   < > 23 22 23 22  21* 20* 23  GLUCOSE 511*   < > 109* 122* 178* 296* 370* 62* 190*  BUN 45*   < > 48* 43* 39* 34* 32* 28* 34*  CREATININE 6.60*   < > 6.76* 5.87* 5.10* 4.52* 4.13* 3.58* 4.31*  CALCIUM  8.4*   < > 8.7* 8.1* 8.3* 8.8* 8.9 8.8* 8.7*  MG 2.6*  --  2.6*  --   --   --   --  2.2  --   PHOS  --   --  6.7* 5.9*  --   --  4.2 3.8 4.7*   < > = values in this interval not displayed.   Liver Function Tests: Recent Labs  Lab 05/30/24 1657 05/31/24 0012 05/31/24 0359 05/31/24 1630 06/01/24 0038 06/02/24 0929  AST 107* 111*  --   --   --   --   ALT 49* 58*  --   --   --   --   ALKPHOS 465* 501*  --   --   --   --   BILITOT 0.3 0.3  --   --   --   --   PROT 5.2* 5.5*  --   --   --   --   ALBUMIN  3.4* 3.7 3.4* 3.4* 3.2* 3.7   No results for input(s): LIPASE, AMYLASE in the last 168 hours. No results for input(s): AMMONIA in the last 168 hours. CBC: Recent Labs  Lab 05/30/24 1657 05/30/24 1828 05/31/24 0358 06/02/24 0929  WBC 12.5*  --  15.5* 14.2*  NEUTROABS 5.0  --   --   --   HGB 9.0* 10.2*  9.9* 8.0* 8.6*  HCT 29.2* 30.0*  29.0* 25.7* 28.1*  MCV 88.2  --  87.7 88.4  PLT 250  --  247 218   Cardiac Enzymes: No results for input(s): CKTOTAL, CKMB, CKMBINDEX, TROPONINI in the last 168 hours. BNP: Invalid input(s): POCBNP CBG: Recent Labs  Lab 06/02/24 1730 06/02/24 1935 06/03/24 0121 06/03/24 0412 06/03/24 0754  GLUCAP 165* 249* 153* 135* 204*   D-Dimer No results for input(s): DDIMER in the last 72 hours. Hgb A1c No results for input(s):  HGBA1C in the last 72 hours. Lipid Profile No results for input(s): CHOL, HDL, LDLCALC, TRIG, CHOLHDL, LDLDIRECT in the last 72 hours. Thyroid  function studies No results for input(s):  TSH, T4TOTAL, T3FREE, THYROIDAB in the last 72 hours.  Invalid input(s): FREET3 Anemia work up No results for input(s): VITAMINB12, FOLATE, FERRITIN, TIBC, IRON , RETICCTPCT in the last 72 hours. Urinalysis    Component Value Date/Time   COLORURINE YELLOW 04/13/2024 1749   APPEARANCEUR HAZY (A) 04/13/2024 1749   APPEARANCEUR Hazy 11/10/2013 2043   LABSPEC 1.018 04/13/2024 1749   LABSPEC 1.031 11/10/2013 2043   PHURINE 7.0 04/13/2024 1749   GLUCOSEU >=500 (A) 04/13/2024 1749   GLUCOSEU >=500 11/10/2013 2043   HGBUR NEGATIVE 04/13/2024 1749   BILIRUBINUR NEGATIVE 04/13/2024 1749   BILIRUBINUR negative 06/04/2018 1035   BILIRUBINUR Negative 11/24/2015 1443   BILIRUBINUR Negative 11/10/2013 2043   KETONESUR 5 (A) 04/13/2024 1749   PROTEINUR >=300 (A) 04/13/2024 1749   UROBILINOGEN 0.2 09/14/2019 1732   NITRITE NEGATIVE 04/13/2024 1749   LEUKOCYTESUR NEGATIVE 04/13/2024 1749   LEUKOCYTESUR 1+ 11/10/2013 2043   Sepsis Labs Recent Labs  Lab 05/30/24 1657 05/31/24 0358 06/02/24 0929  WBC 12.5* 15.5* 14.2*   Microbiology Recent Results (from the past 240 hours)  Body fluid culture w Gram Stain     Status: None   Collection Time: 05/27/24 11:34 AM   Specimen: Abdomen; Peritoneal Fluid  Result Value Ref Range Status   Specimen Description PERITONEAL  Final   Special Requests NONE  Final   Gram Stain NO WBC SEEN NO ORGANISMS SEEN   Final   Culture   Final    NO GROWTH 3 DAYS Performed at [facility], [address], [place], [place] [address]    Report Status 05/30/2024 FINAL  Final  MRSA Next Gen by PCR, Nasal     Status: None   Collection Time: 05/30/24 11:30 PM   Specimen: Nasal Mucosa; Nasal Swab  Result Value Ref Range Status   MRSA by PCR  Next Gen NOT DETECTED NOT DETECTED Final    Comment: (NOTE) The GeneXpert MRSA Assay (FDA approved for NASAL specimens only), is one component of a comprehensive MRSA colonization surveillance program. It is not intended to diagnose MRSA infection nor to guide or monitor treatment for MRSA infections. Test performance is not FDA approved in patients less than 38 years old. Performed at [facility], [address] [place], [place] [address]      Time coordinating discharge: 35 minutes  SIGNED:   Renato Applebaum, MD  [facility] 06/03/2024, 1:51 PM     [1]  Allergies Allergen Reactions   Keflex  [Cephalexin ] Anaphylaxis    Ceftriaxone  in the past with no reaction   Penicillins Anaphylaxis, Hives and Rash   Vibramycin  [Doxycycline ] Anaphylaxis   Benadryl  [Diphenhydramine ] Itching   Entresto  [Sacubitril -Valsartan ] Swelling    angioedema   Dilaudid  [Hydromorphone ] Itching   Losartan  Dermatitis   Methotrexate And Trimetrexate Rash   Roxicodone  [Oxycodone ] Itching    Takes Percocet without issue

## 2024-06-03 NOTE — Plan of Care (Signed)
" °  Problem: Education: Goal: Ability to describe self-care measures that may prevent or decrease complications (Diabetes Survival Skills Education) will improve Outcome: Adequate for Discharge Goal: Individualized Educational Video(s) Outcome: Adequate for Discharge   Problem: Coping: Goal: Ability to adjust to condition or change in health will improve Outcome: Adequate for Discharge   Problem: Fluid Volume: Goal: Ability to maintain a balanced intake and output will improve Outcome: Adequate for Discharge   Problem: Health Behavior/Discharge Planning: Goal: Ability to identify and utilize available resources and services will improve Outcome: Adequate for Discharge Goal: Ability to manage health-related needs will improve Outcome: Adequate for Discharge   Problem: Metabolic: Goal: Ability to maintain appropriate glucose levels will improve Outcome: Adequate for Discharge   Problem: Nutritional: Goal: Maintenance of adequate nutrition will improve Outcome: Adequate for Discharge Goal: Maintenance of adequate weight for body size and type will improve Outcome: Adequate for Discharge   Problem: Respiratory: Goal: Will regain and/or maintain adequate ventilation Outcome: Adequate for Discharge   "

## 2024-06-03 NOTE — TOC Transition Note (Signed)
 Transition of Care [facility]) - Discharge Note   Patient Details  Name: Ashley Freeman MRN: [id number] Date of Birth: 1990/08/18  [facility]) CM/SW Contact:  Tom-Johnson, Harvest Muskrat, RN Phone Number: 06/03/2024, 3:42 PM   Clinical Narrative:     Patient is scheduled for discharge today.  Readmission Risk Assessment done. Hospital f/u and discharge instructions on AVS. CM sent in application for [facility] [place] via email to [email or web address] with response that application has been received and it will be reviewed and processed. CM informed patient and she states she will find a ride tomorrow to go to her scheduled dialysis. CM encouraged patient to keep calling Medicaid to complete needed paperwork for her to be reinstated.  Cab voucher will be given to patient to transport at discharge.  No further ICM needs noted.       Final next level of care: Home/Self Care Barriers to Discharge: Barriers Resolved   Patient Goals and CMS Choice Patient states their goals for this hospitalization and ongoing recovery are:: To return home CMS Medicare.gov Compare Post Acute Care list provided to:: Patient Choice offered to / list presented to : Patient      Discharge Placement                Patient to be transferred to facility by: [facility]      Discharge Plan and Services Additional resources added to the After Visit Summary for     Discharge Planning Services: CM Consult, Other - See comment Teaching Laboratory Technician) Post Acute Care Choice: NA          DME Arranged: N/A DME Agency: NA       HH Arranged: NA HH Agency: NA        Social Drivers of Health (SDOH) Interventions SDOH Screenings   Food Insecurity: No Food Insecurity (06/02/2024)  Housing: Low Risk (06/02/2024)  Transportation Needs: No Transportation Needs (06/02/2024)  Utilities: Not At Risk (06/02/2024)  Financial Resource Strain: Medium Risk (05/13/2024)   Received from [facility]   Physical Activity: Insufficiently Active (03/02/2022)   [facility] from [facility]  Social Connections: Moderately Isolated (05/03/2024)  Stress: No Stress Concern Present (03/08/2023)   Received from [facility]  Tobacco Use: Low Risk (05/30/2024)     Readmission Risk Interventions    06/03/2024    3:38 PM 03/23/2024    2:19 PM 02/06/2024    2:01 PM  Readmission Risk Prevention Plan  Transportation Screening Complete Complete Complete  Medication Review (RN Care Manager) Referral to Pharmacy Complete Referral to Pharmacy  PCP or Specialist appointment within 3-5 days of discharge Complete Complete Complete  HRI or Home Care Consult Complete Complete Complete  SW Recovery Care/Counseling Consult Complete  Complete  Palliative Care Screening Not Applicable Not Applicable Not Applicable  Skilled Nursing Facility Not Applicable Not Applicable Not Applicable

## 2024-06-04 ENCOUNTER — Inpatient Hospital Stay (HOSPITAL_COMMUNITY)
Admission: EM | Admit: 2024-06-04 | Discharge: 2024-06-05 | Source: Home / Self Care | Attending: Internal Medicine | Admitting: Internal Medicine

## 2024-06-04 DIAGNOSIS — E1165 Type 2 diabetes mellitus with hyperglycemia: Principal | ICD-10-CM

## 2024-06-04 DIAGNOSIS — D631 Anemia in chronic kidney disease: Secondary | ICD-10-CM | POA: Diagnosis present

## 2024-06-04 DIAGNOSIS — Z87898 Personal history of other specified conditions: Secondary | ICD-10-CM

## 2024-06-04 DIAGNOSIS — E1065 Type 1 diabetes mellitus with hyperglycemia: Secondary | ICD-10-CM | POA: Diagnosis present

## 2024-06-04 DIAGNOSIS — F319 Bipolar disorder, unspecified: Secondary | ICD-10-CM | POA: Diagnosis present

## 2024-06-04 DIAGNOSIS — E1110 Type 2 diabetes mellitus with ketoacidosis without coma: Secondary | ICD-10-CM | POA: Diagnosis present

## 2024-06-04 DIAGNOSIS — R569 Unspecified convulsions: Secondary | ICD-10-CM

## 2024-06-04 DIAGNOSIS — E1100 Type 2 diabetes mellitus with hyperosmolarity without nonketotic hyperglycemic-hyperosmolar coma (NKHHC): Secondary | ICD-10-CM | POA: Diagnosis present

## 2024-06-04 DIAGNOSIS — D649 Anemia, unspecified: Secondary | ICD-10-CM | POA: Diagnosis present

## 2024-06-04 DIAGNOSIS — N186 End stage renal disease: Secondary | ICD-10-CM

## 2024-06-04 DIAGNOSIS — I160 Hypertensive urgency: Secondary | ICD-10-CM | POA: Insufficient documentation

## 2024-06-04 LAB — I-STAT VENOUS BLOOD GAS, ED
Acid-base deficit: 2 mmol/L (ref 0.0–2.0)
Bicarbonate: 22.7 mmol/L (ref 20.0–28.0)
Calcium, Ion: 1.06 mmol/L — ABNORMAL LOW (ref 1.15–1.40)
HCT: 29 % — ABNORMAL LOW (ref 36.0–46.0)
Hemoglobin: 9.9 g/dL — ABNORMAL LOW (ref 12.0–15.0)
O2 Saturation: 87 %
Potassium: 6.3 mmol/L (ref 3.5–5.1)
Sodium: 129 mmol/L — ABNORMAL LOW (ref 135–145)
TCO2: 24 mmol/L (ref 22–32)
pCO2, Ven: 36.1 mmHg — ABNORMAL LOW (ref 44–60)
pH, Ven: 7.406 (ref 7.25–7.43)
pO2, Ven: 52 mmHg — ABNORMAL HIGH (ref 32–45)

## 2024-06-04 LAB — CBG MONITORING, ED: Glucose-Capillary: >600 mg/dL (ref 70–99)

## 2024-06-04 MED ORDER — SODIUM CHLORIDE 0.9 % IV BOLUS
1000.0000 mL | Freq: Once | INTRAVENOUS | Status: AC
  Administered 2024-06-05: 1000 mL via INTRAVENOUS

## 2024-06-04 NOTE — ED Triage Notes (Signed)
 Pt BIB [facility] from home, needs dialysis and hyperglycemic, TTS dialysis, dialysis yesterday d/t snow schedule. Did not watch fluid intake over 24 hours. Compliant with dialysis and insulin  meds per patient this week.   178/palp 102 HR 98% RA CBG HI per EMS

## 2024-06-05 ENCOUNTER — Encounter (HOSPITAL_COMMUNITY): Payer: Self-pay | Admitting: Internal Medicine

## 2024-06-05 ENCOUNTER — Emergency Department (HOSPITAL_COMMUNITY)

## 2024-06-05 DIAGNOSIS — I160 Hypertensive urgency: Secondary | ICD-10-CM | POA: Insufficient documentation

## 2024-06-05 LAB — CBG MONITORING, ED
Glucose-Capillary: 548 mg/dL (ref 70–99)
Glucose-Capillary: 413 mg/dL — ABNORMAL HIGH (ref 70–99)
Glucose-Capillary: 521 mg/dL (ref 70–99)
Glucose-Capillary: 389 mg/dL — ABNORMAL HIGH (ref 70–99)

## 2024-06-05 LAB — BASIC METABOLIC PANEL WITH GFR
Anion gap: 14 (ref 5–15)
Anion gap: 14 (ref 5–15)
BUN: 39 mg/dL — ABNORMAL HIGH (ref 6–20)
BUN: 39 mg/dL — ABNORMAL HIGH (ref 6–20)
CO2: 22 mmol/L (ref 22–32)
CO2: 23 mmol/L (ref 22–32)
Calcium: 8.9 mg/dL (ref 8.9–10.3)
Calcium: 8.9 mg/dL (ref 8.9–10.3)
Chloride: 97 mmol/L — ABNORMAL LOW (ref 98–111)
Chloride: 100 mmol/L (ref 98–111)
Creatinine, Ser: 5.06 mg/dL — ABNORMAL HIGH (ref 0.44–1.00)
Creatinine, Ser: 5.06 mg/dL — ABNORMAL HIGH (ref 0.44–1.00)
GFR, Estimated: 11 mL/min — ABNORMAL LOW
GFR, Estimated: 11 mL/min — ABNORMAL LOW
Glucose, Bld: 661 mg/dL (ref 70–99)
Glucose, Bld: 394 mg/dL — ABNORMAL HIGH (ref 70–99)
Potassium: 5.4 mmol/L — ABNORMAL HIGH (ref 3.5–5.1)
Potassium: 4.7 mmol/L (ref 3.5–5.1)
Sodium: 133 mmol/L — ABNORMAL LOW (ref 135–145)
Sodium: 136 mmol/L (ref 135–145)

## 2024-06-05 LAB — BETA-HYDROXYBUTYRIC ACID
Beta-Hydroxybutyric Acid: 0.07 mmol/L (ref 0.05–0.27)
Beta-Hydroxybutyric Acid: 0.1 mmol/L (ref 0.05–0.27)

## 2024-06-05 LAB — CBC
HCT: 24.9 % — ABNORMAL LOW (ref 36.0–46.0)
HCT: 27.5 % — ABNORMAL LOW (ref 36.0–46.0)
Hemoglobin: 7.6 g/dL — ABNORMAL LOW (ref 12.0–15.0)
Hemoglobin: 8.3 g/dL — ABNORMAL LOW (ref 12.0–15.0)
MCH: 27.2 pg (ref 26.0–34.0)
MCH: 27.5 pg (ref 26.0–34.0)
MCHC: 30.2 g/dL (ref 30.0–36.0)
MCHC: 30.5 g/dL (ref 30.0–36.0)
MCV: 90.2 fL (ref 80.0–100.0)
MCV: 90.2 fL (ref 80.0–100.0)
Platelets: 200 10*3/uL (ref 150–400)
Platelets: 215 10*3/uL (ref 150–400)
RBC: 2.76 MIL/uL — ABNORMAL LOW (ref 3.87–5.11)
RBC: 3.05 MIL/uL — ABNORMAL LOW (ref 3.87–5.11)
RDW: 16.6 % — ABNORMAL HIGH (ref 11.5–15.5)
RDW: 16.8 % — ABNORMAL HIGH (ref 11.5–15.5)
WBC: 15.6 10*3/uL — ABNORMAL HIGH (ref 4.0–10.5)
WBC: 17.9 10*3/uL — ABNORMAL HIGH (ref 4.0–10.5)
nRBC: 0.1 % (ref 0.0–0.2)
nRBC: 0.2 % (ref 0.0–0.2)

## 2024-06-05 LAB — COMPREHENSIVE METABOLIC PANEL WITH GFR
ALT: 48 U/L — ABNORMAL HIGH (ref 0–44)
AST: 76 U/L — ABNORMAL HIGH (ref 15–41)
Albumin: 3.5 g/dL (ref 3.5–5.0)
Alkaline Phosphatase: 395 U/L — ABNORMAL HIGH (ref 38–126)
Anion gap: 15 (ref 5–15)
BUN: 39 mg/dL — ABNORMAL HIGH (ref 6–20)
CO2: 19 mmol/L — ABNORMAL LOW (ref 22–32)
Calcium: 8.5 mg/dL — ABNORMAL LOW (ref 8.9–10.3)
Chloride: 94 mmol/L — ABNORMAL LOW (ref 98–111)
Creatinine, Ser: 4.97 mg/dL — ABNORMAL HIGH (ref 0.44–1.00)
GFR, Estimated: 11 mL/min — ABNORMAL LOW
Glucose, Bld: 890 mg/dL (ref 70–99)
Potassium: 6.5 mmol/L (ref 3.5–5.1)
Sodium: 128 mmol/L — ABNORMAL LOW (ref 135–145)
Total Bilirubin: 0.4 mg/dL (ref 0.0–1.2)
Total Protein: 5.6 g/dL — ABNORMAL LOW (ref 6.5–8.1)

## 2024-06-05 LAB — HCG, SERUM, QUALITATIVE: Preg, Serum: NEGATIVE

## 2024-06-05 MED ORDER — HEPARIN SODIUM (PORCINE) 5000 UNIT/ML IJ SOLN: 5000.0000 [IU] | Freq: Three times a day (TID) | INTRAMUSCULAR | Status: DC

## 2024-06-05 MED ORDER — CARVEDILOL 12.5 MG PO TABS: 25.0000 mg | ORAL_TABLET | Freq: Two times a day (BID) | ORAL | Status: DC

## 2024-06-05 MED ORDER — INSULIN REGULAR(HUMAN) IN NACL 100-0.9 UT/100ML-% IV SOLN
INTRAVENOUS | Status: DC
  Administered 2024-06-05: 5.5 [IU]/h via INTRAVENOUS

## 2024-06-05 MED ORDER — INSULIN ASPART 100 UNIT/ML IV SOLN
10.0000 [IU] | Freq: Once | INTRAVENOUS | Status: AC
  Administered 2024-06-05: 10 [IU] via INTRAVENOUS
  Filled 2024-06-05: qty 10

## 2024-06-05 MED ORDER — PANTOPRAZOLE SODIUM 40 MG PO TBEC: 40.0000 mg | DELAYED_RELEASE_TABLET | Freq: Every day | ORAL | Status: DC

## 2024-06-05 MED ORDER — DEXTROSE IN LACTATED RINGERS 5 % IV SOLN: INTRAVENOUS | Status: DC

## 2024-06-05 MED ORDER — FENTANYL CITRATE (PF) 50 MCG/ML IJ SOSY: 25.0000 ug | PREFILLED_SYRINGE | Freq: Once | INTRAMUSCULAR | Status: DC

## 2024-06-05 MED ORDER — DIPHENHYDRAMINE HCL 50 MG/ML IJ SOLN
25.0000 mg | Freq: Once | INTRAMUSCULAR | Status: DC
  Filled 2024-06-05: qty 1

## 2024-06-05 MED ORDER — SEVELAMER CARBONATE 800 MG PO TABS: 800.0000 mg | ORAL_TABLET | Freq: Three times a day (TID) | ORAL | Status: DC

## 2024-06-05 MED ORDER — DULOXETINE HCL 20 MG PO CPEP: 20.0000 mg | ORAL_CAPSULE | Freq: Every day | ORAL | Status: DC

## 2024-06-05 MED ORDER — BUMETANIDE 2 MG PO TABS
10.0000 mg | ORAL_TABLET | Freq: Every day | ORAL | Status: DC
  Filled 2024-06-05: qty 5

## 2024-06-05 MED ORDER — CALCIUM GLUCONATE-NACL 1-0.675 GM/50ML-% IV SOLN
1.0000 g | Freq: Once | INTRAVENOUS | Status: AC
  Administered 2024-06-05: 1000 mg via INTRAVENOUS
  Filled 2024-06-05: qty 50

## 2024-06-05 MED ORDER — INSULIN REGULAR(HUMAN) IN NACL 100-0.9 UT/100ML-% IV SOLN
INTRAVENOUS | Status: DC
  Administered 2024-06-05: 7.5 [IU]/h via INTRAVENOUS
  Filled 2024-06-05: qty 100

## 2024-06-05 MED ORDER — LACOSAMIDE 50 MG PO TABS: 50.0000 mg | ORAL_TABLET | ORAL | Status: DC

## 2024-06-05 MED ORDER — FENTANYL CITRATE (PF) 50 MCG/ML IJ SOSY
50.0000 ug | PREFILLED_SYRINGE | Freq: Once | INTRAMUSCULAR | Status: AC
  Administered 2024-06-05: 50 ug via INTRAVENOUS
  Filled 2024-06-05: qty 1

## 2024-06-05 MED ORDER — LACOSAMIDE 50 MG PO TABS: 100.0000 mg | ORAL_TABLET | Freq: Two times a day (BID) | ORAL | Status: DC

## 2024-06-05 MED ORDER — DEXTROSE 50 % IV SOLN
0.0000 mL | INTRAVENOUS | Status: DC | PRN
  Filled 2024-06-05: qty 50

## 2024-06-05 MED ORDER — OLANZAPINE 10 MG PO TABS: 5.0000 mg | ORAL_TABLET | Freq: Every day | ORAL | Status: DC

## 2024-06-05 MED ORDER — ALBUTEROL SULFATE (2.5 MG/3ML) 0.083% IN NEBU: 2.5000 mg | INHALATION_SOLUTION | RESPIRATORY_TRACT | Status: DC | PRN

## 2024-06-05 MED ORDER — LACTATED RINGERS IV SOLN: INTRAVENOUS | Status: DC

## 2024-06-05 MED ORDER — PROCHLORPERAZINE EDISYLATE 10 MG/2ML IJ SOLN
10.0000 mg | Freq: Once | INTRAMUSCULAR | Status: AC
  Administered 2024-06-05: 10 mg via INTRAVENOUS
  Filled 2024-06-05: qty 2

## 2024-06-05 NOTE — ED Notes (Signed)
 Patient alert and oriented, awake requesting pain meds. Provider [facility] messaged for further. Patient changed into gown and placed in position of comfort reports being uncomfortable and hot.

## 2024-06-05 NOTE — ED Provider Notes (Signed)
 " [facility] DE[facility] Emergency Department Provider Note MRN:  [id number]  Arrival date & time: 06/05/24     Chief Complaint   Body aches History of Present Illness   Ashley Freeman is a 34 y.o. year-old female with a history of diabetes presenting to the ED with chief complaint of bodyaches.  Patient endorsing pain all over, had dialysis yesterday but thinks she needs it again.  Her blood sugar is elevated.  Denies chest pain, no fever.  Review of Systems  A thorough review of systems was obtained and all systems are negative except as noted in the HPI and PMH.   Patient's Health History    Past Medical History:  Diagnosis Date   Abscess, gluteal, right 08/24/2013   Anemia 02/19/2012   Bartholin's gland abscess 09/19/2013   Bipolar disorder (HCC)    BV (bacterial vaginosis) 11/24/2015   Depression    Diabetes mellitus type I (HCC) 2001   Diagnosed at age 52 ; Type I   Diarrhea 05/30/2016   DKA (diabetic ketoacidoses) 08/19/2013   Also in 2018   ESRD (end stage renal disease) (HCC)    Gonorrhea 08/2011   Treated in 09/2011   HFrEF (heart failure with reduced ejection fraction) (HCC)    a. 2022 Echo: EF 40%; b. 10/2021 Echo: EF 55%; b. 07/2022 MV: No ischemia. EF 31%; c. 08/2022 Echo: EF 35%, mildly dil RV, sev TR.   History of trichomoniasis 05/31/2016   Hyperlipidemia 03/28/2016   Hypertension    NICM (nonischemic cardiomyopathy) (HCC)    Seizures (HCC)    Sepsis (HCC) 09/19/2013    Past Surgical History:  Procedure Laterality Date   A/V FISTULAGRAM Right 06/17/2023   Procedure: A/V Fistulagram;  Surgeon: Marea Selinda GORMAN, MD;  Location: [facility] INVASIVE CV LAB;  Service: Cardiovascular;  Laterality: Right;   A/V SHUNT INTERVENTION N/A 09/25/2023   Procedure: A/V SHUNT INTERVENTION;  Surgeon: Pearline Norman GORMAN, MD;  Location: [facility] PV LAB;  Service: Cardiovascular;  Laterality: N/A;   AV FISTULA PLACEMENT Right 07/06/2022   Procedure: ARTERIOVENOUS  GRAFT CREATION;  Surgeon: Gretta Lonni PARAS, MD;  Location[facility] OR;  Service: Vascular;  Laterality: Right;   CESAREAN SECTION N/A 10/05/2019   Procedure: CESAREAN SECTION;  Surgeon: Izell Harari, MD;  Location: [facility] LD ORS;  Service: Obstetrics;  Laterality: N/A;   CHOLECYSTECTOMY N/A 07/02/2023   Procedure: LAPAROSCOPIC CHOLECYSTECTOMY;  Surgeon: Ebbie Cough, MD;  Location: [facility] OR;  Service: General;  Laterality: N/A;   ESOPHAGOGASTRODUODENOSCOPY N/A 01/31/2024   Procedure: EGD (ESOPHAGOGASTRODUODENOSCOPY);  Surgeon: San Sandor GAILS, DO;  Location: [facility] ENDOSCOPY;  Service: Gastroenterology;  Laterality: N/A;   INCISION AND DRAINAGE ABSCESS Left 09/28/2019   Procedure: INCISION AND DRAINAGE VULVAR ABCESS;  Surgeon: Edsel Norleen GAILS, MD;  Location: [facility] OR;  Service: Gynecology;  Laterality: Left;   INCISION AND DRAINAGE ABSCESS Right 02/23/2024   Procedure: INCISION AND DRAINAGE, ABSCESS;  Surgeon: Tobie Eldora NOVAK, MD;  Location: [facility] OR;  Service: ENT;  Laterality: Right;   INCISION AND DRAINAGE PERIRECTAL ABSCESS Right 08/18/2013   Procedure: IRRIGATION AND DEBRIDEMENT GLUTEAL ABSCESS;  Surgeon: Lynda Leos, MD;  Location: [facility] OR;  Service: General;  Laterality: Right;   INCISION AND DRAINAGE PERIRECTAL ABSCESS Right 09/19/2013   Procedure: IRRIGATION AND DEBRIDEMENT RIGHT GLUTEAL AND LABIAL ABSCESSES;  Surgeon: Lynda Leos, MD;  Location: [facility] OR;  Service: General;  Laterality: Right;   INCISION AND DRAINAGE PERIRECTAL ABSCESS Right 09/24/2013   Procedure: IRRIGATION AND DEBRIDEMENT PERIRECTAL ABSCESS;  Surgeon: Lynwood MALVA Pina, MD;  Location: [facility] OR;  Service: General;  Laterality: Right;   IR PARACENTESIS  08/28/2023   IR PARACENTESIS  11/04/2023   IR PARACENTESIS  12/23/2023   IR PARACENTESIS  02/04/2024   IR PARACENTESIS  03/23/2024   IR PARACENTESIS  04/06/2024   IR PARACENTESIS  05/27/2024    Family History  Problem Relation Age of Onset   Asthma Mother    Carpal tunnel  syndrome Mother    Gout Father    Diabetes Paternal Grandmother    Anesthesia problems Neg Hx     Social History   Socioeconomic History   Marital status: Single    Spouse name: Not on file   Number of children: 0   Years of education: 11th grade   Highest education level: Not on file  Occupational History   Occupation: unemployed    Comment: has never worked  Tobacco Use   Smoking status: Never    Passive exposure: Never   Smokeless tobacco: Never  Vaping Use   Vaping status: Never Used  Substance and Sexual Activity   Alcohol  use: Not Currently   Drug use: No   Sexual activity: Yes    Birth control/protection: None  Other Topics Concern   Not on file  Social History Narrative   Patient lives in [place] mother lives in [place].  Unemployed.  Previously worked for a customer service manager.  Completed 11 grade working on [employer]. Patient 3 brothers    Social Drivers of Health   Tobacco Use: Low Risk (05/30/2024)   Patient History    Smoking Tobacco Use: Never    Smokeless Tobacco Use: Never    Passive Exposure: Never  Financial Resource Strain: Medium Risk (05/13/2024)   Received from [facility]   Overall Financial Resource Strain (CARDIA)    How hard is it for you to pay for the very basics like food, housing, medical care, and heating?: Somewhat hard  Food Insecurity: No Food Insecurity (06/02/2024)   Epic    Worried About Running Out of Food in the Last Year: Never true    Ran Out of Food in the Last Year: Never true  Transportation Needs: No Transportation Needs (06/02/2024)   Epic    Lack of Transportation (Medical): No    Lack of Transportation (Non-Medical): No  Physical Activity: Insufficiently Active (03/02/2022)   Received from [facility]   Exercise Vital Sign    On average, how many days per week do you engage in moderate to strenuous exercise (like a brisk walk)?: 2 days    On average, how many minutes do you engage in exercise at this level?: 10 min   Stress: No Stress Concern Present (03/08/2023)   Received from [facility] - Occupational Stress Questionnaire    Feeling of Stress : Not at all  Social Connections: Moderately Isolated (05/03/2024)   Social Connection and Isolation Panel    Frequency of Communication with Friends and Family: Three times a week    Frequency of Social Gatherings with Friends and Family: Once a week    Attends Religious Services: 1 to 4 times per year    Active Member of [employer] or Organizations: No    Attends Banker Meetings: Never    Marital Status: Never married  Intimate Partner Violence: Not At Risk (06/02/2024)   Epic    Fear of Current or Ex-Partner: No    Emotionally Abused: No  Physically Abused: No    Sexually Abused: No  Depression (PHQ2-9): Not on file  Alcohol  Screen: Not on file  Housing: Low Risk (06/02/2024)   Epic    Unable to Pay for Housing in the Last Year: No    Number of Times Moved in the Last Year: 0    Homeless in the Last Year: No  Utilities: Not At Risk (06/02/2024)   Epic    Threatened with loss of utilities: No  Health Literacy: Not on file     Physical Exam   Vitals:   06/05/24 0039 06/05/24 0212  BP: (!) 194/90 (!) 197/100  Pulse: 90 94  Resp: (!) 21 (!) 22  Temp:    SpO2: 100% 100%    CONSTITUTIONAL: Well-appearing, NAD NEURO/PSYCH:  Alert and oriented x 3, no focal deficits EYES:  eyes equal and reactive ENT/NECK:  no LAD, no JVD CARDIO: Regular rate, well-perfused, normal S1 and S2 PULM:  CTAB no wheezing or rhonchi GI/GU:  non-distended, non-tender MSK/SPINE:  No gross deformities, no edema SKIN:  no rash, atraumatic   *Additional and/or pertinent findings included in MDM below  Diagnostic and Interventional Summary    EKG Interpretation Date/Time:  Friday June 05 2024 00:02:46 EST Ventricular Rate:  89 PR Interval:  154 QRS Duration:  76 QT Interval:  378 QTC Calculation: 459 R  Axis:   43  Text Interpretation: Normal sinus rhythm Cannot rule out Anterior infarct , age undetermined Abnormal ECG When compared with ECG of 01-Jun-2024 08:04, PREVIOUS ECG IS PRESENT Confirmed by Theadore Sharper [id number]) on 06/05/2024 12:38:40 AM       Labs Reviewed  CBC - Abnormal; Notable for the following components:      Result Value   WBC 15.6 (*)    RBC 3.05 (*)    Hemoglobin 8.3 (*)    HCT 27.5 (*)    RDW 16.8 (*)    All other components within normal limits  COMPREHENSIVE METABOLIC PANEL WITH GFR - Abnormal; Notable for the following components:   Sodium 128 (*)    Potassium 6.5 (*)    Chloride 94 (*)    CO2 19 (*)    Glucose, Bld 890 (*)    BUN 39 (*)    Creatinine, Ser 4.97 (*)    Calcium  8.5 (*)    Total Protein 5.6 (*)    AST 76 (*)    ALT 48 (*)    Alkaline Phosphatase 395 (*)    GFR, Estimated 11 (*)    All other components within normal limits  CBG MONITORING, ED - Abnormal; Notable for the following components:   Glucose-Capillary >600 (*)    All other components within normal limits  I-STAT VENOUS BLOOD GAS, ED - Abnormal; Notable for the following components:   pCO2, Ven 36.1 (*)    pO2, Ven 52 (*)    Sodium 129 (*)    Potassium 6.3 (*)    Calcium , Ion 1.06 (*)    HCT 29.0 (*)    Hemoglobin 9.9 (*)    All other components within normal limits  BETA-HYDROXYBUTYRIC ACID  HCG, SERUM, QUALITATIVE  BASIC METABOLIC PANEL WITH GFR  URINALYSIS, ROUTINE W REFLEX MICROSCOPIC    DG Chest 2 View  Final Result      Medications  calcium  gluconate 1 g/ 50 mL sodium chloride  IVPB (1,000 mg Intravenous New Bag/Given 06/05/24 0142)  insulin  regular, human (MYXREDLIN ) 100 units/ 100 mL infusion (has no administration in time range)  lactated ringers  infusion (has no administration in time range)  dextrose  5 % in lactated ringers  infusion (has no administration in time range)  dextrose  50 % solution 0-50 mL (has no administration in time range)  sodium chloride   0.9 % bolus 1,000 mL (0 mLs Intravenous Paused 06/05/24 0028)  fentaNYL  (SUBLIMAZE ) injection 50 mcg (50 mcg Intravenous Given 06/05/24 0124)  insulin  aspart (novoLOG ) injection 10 Units (10 Units Intravenous Given 06/05/24 0124)     Procedures  /  Critical Care .Critical Care  Performed by: Theadore Ozell HERO, MD Authorized by: Theadore Ozell HERO, MD   Critical care provider statement:    Critical care time (minutes):  35   Critical care was time spent personally by me on the following activities:  Development of treatment plan with patient or surrogate, discussions with consultants, evaluation of patient's response to treatment, examination of patient, ordering and review of laboratory studies, ordering and review of radiographic studies, ordering and performing treatments and interventions, pulse oximetry, re-evaluation of patient's condition and review of old charts (Hyperglycemic crisis)   ED Course and Medical Decision Making  Initial Impression and Ddx Vague complaints of bodyaches and malaise, has had the symptoms before in the setting of DKA versus needing dialysis.  She is nontoxic-appearing, hypertensive otherwise reassuring vital signs, no fever, soft abdomen, no increased work of breathing.  CBG is greater than 600, awaiting formal labs with particular attention to potassium.  Obtaining EKG.  Past medical/surgical history that increases complexity of ED encounter: Type 1 diabetes, ESRD  Interpretation of Diagnostics I personally reviewed the EKG and my interpretation is as follows: Sinus rhythm  Labs reveal hyperkalemia and blood sugar near 900, does not appear to be diabetic ketoacidosis based on other laboratory values.  Patient Reassessment and Ultimate Disposition/Management     Will plan for hospitalist admission for insulin  drip, reaching out to nephrology, suspect the potassium will correct nicely with the insulin .  Patient management required discussion with the following  services or consulting groups:  Hospitalist Service and Nephrology  Complexity of Problems Addressed Acute illness or injury that poses threat of life of bodily function  Additional Data Reviewed and Analyzed Further history obtained from: Prior labs/imaging results  Additional Factors Impacting ED Encounter Risk Consideration of hospitalization  Ozell HERO. Theadore, MD [facility] [email or web address]  Final Clinical Impressions(s) / ED Diagnoses     ICD-10-CM   1. Hyperglycemic crisis in diabetes mellitus (HCC)  E11.65       ED Discharge Orders     None        Discharge Instructions Discussed with and Provided to Patient:   Discharge Instructions   None      Theadore Ozell HERO, MD 06/05/24 0240  "

## 2024-06-05 NOTE — ED Notes (Signed)
 Patient requesting to leave, stated no child care. EDP Bero aware and at bedside explained risk of leaving including diabetic coma and death. MD Franky aware patient requested to leave AMA.

## 2024-06-05 NOTE — H&P (Signed)
 " History and Physical    Ashley Freeman [place] FMW:[id number] DOB: [birth year] DOA: 06/04/2024  Patient coming from: Home.  Chief Complaint: Missed dialysis.  HPI: Ashley Freeman is a 34 y.o. female with history of diabetes mellitus type 1, ESRD on hemodialysis, depression, anemia, seizures who has had recurrent admissions for DKA recently discharged 2 days ago presents to the ER after patient states she missed her dialysis.  She states she was not able to make to the dialysis due to transport issues.  Also states that her sugars have been running high despite taking her insulin  long-acting insulin  5 units.  Denies any nausea vomiting or diarrhea.  ED Course: In the ER patient's blood sugar was found to be 890 anion gap of 15 WBC 15.6 potassium 6.5 hemoglobin 8.3 pregnancy screen negative chest x-ray unremarkable EKG normal sinus rhythm patient was started on IV insulin  infusion for hyperosmolar status admitted for further management.  Review of Systems: As per HPI, rest all negative.   Past Medical History:  Diagnosis Date   Abscess, gluteal, right 08/24/2013   Anemia 02/19/2012   Bartholin's gland abscess 09/19/2013   Bipolar disorder (HCC)    BV (bacterial vaginosis) 11/24/2015   Depression    Diabetes mellitus type I (HCC) 2001   Diagnosed at age 89 ; Type I   Diarrhea 05/30/2016   DKA (diabetic ketoacidoses) 08/19/2013   Also in 2018   ESRD (end stage renal disease) (HCC)    Gonorrhea 08/2011   Treated in 09/2011   HFrEF (heart failure with reduced ejection fraction) (HCC)    a. 2022 Echo: EF 40%; b. 10/2021 Echo: EF 55%; b. 07/2022 MV: No ischemia. EF 31%; c. 08/2022 Echo: EF 35%, mildly dil RV, sev TR.   History of trichomoniasis 05/31/2016   Hyperlipidemia 03/28/2016   Hypertension    NICM (nonischemic cardiomyopathy) (HCC)    Seizures (HCC)    Sepsis (HCC) 09/19/2013    Past Surgical History:  Procedure Laterality Date   A/V FISTULAGRAM Right  06/17/2023   Procedure: A/V Fistulagram;  Surgeon: Marea Selinda RAMAN, MD;  Location: [facility] INVASIVE CV LAB;  Service: Cardiovascular;  Laterality: Right;   A/V SHUNT INTERVENTION N/A 09/25/2023   Procedure: A/V SHUNT INTERVENTION;  Surgeon: Pearline Norman RAMAN, MD;  Location: [facility] LAB;  Service: Cardiovascular;  Laterality: N/A;   AV FISTULA PLACEMENT Right 07/06/2022   Procedure: ARTERIOVENOUS GRAFT CREATION;  Surgeon: Gretta Lonni PARAS, MD;  Location: [facility] OR;  Service: Vascular;  Laterality: Right;   CESAREAN SECTION N/A 10/05/2019   Procedure: CESAREAN SECTION;  Surgeon: Izell Harari, MD;  Location: [facility] ORS;  Service: Obstetrics;  Laterality: N/A;   CHOLECYSTECTOMY N/A 07/02/2023   Procedure: LAPAROSCOPIC CHOLECYSTECTOMY;  Surgeon: Ebbie Cough, MD;  Location: [facility] OR;  Service: General;  Laterality: N/A;   ESOPHAGOGASTRODUODENOSCOPY N/A 01/31/2024   Procedure: EGD (ESOPHAGOGASTRODUODENOSCOPY);  Surgeon: San Sandor GAILS, DO;  Location: [facility] ENDOSCOPY;  Service: Gastroenterology;  Laterality: N/A;   INCISION AND DRAINAGE ABSCESS Left 09/28/2019   Procedure: INCISION AND DRAINAGE VULVAR ABCESS;  Surgeon: Edsel Norleen GAILS, MD;  Location: [facility] OR;  Service: Gynecology;  Laterality: Left;   INCISION AND DRAINAGE ABSCESS Right 02/23/2024   Procedure: INCISION AND DRAINAGE, ABSCESS;  Surgeon: Tobie Eldora NOVAK, MD;  Location: [facility] OR;  Service: ENT;  Laterality: Right;   INCISION AND DRAINAGE PERIRECTAL ABSCESS Right 08/18/2013   Procedure: IRRIGATION AND DEBRIDEMENT GLUTEAL ABSCESS;  Surgeon: Lynda Leos, MD;  Location: [facility] OR;  Service: General;  Laterality: Right;   INCISION AND DRAINAGE PERIRECTAL ABSCESS Right 09/19/2013   Procedure: IRRIGATION AND DEBRIDEMENT RIGHT GLUTEAL AND LABIAL ABSCESSES;  Surgeon: Lynda Leos, MD;  Location: [facility] OR;  Service: General;  Laterality: Right;   INCISION AND DRAINAGE PERIRECTAL ABSCESS Right 09/24/2013   Procedure: IRRIGATION AND DEBRIDEMENT PERIRECTAL  ABSCESS;  Surgeon: Lynwood MALVA Pina, MD;  Location: [facility] OR;  Service: General;  Laterality: Right;   IR PARACENTESIS  08/28/2023   IR PARACENTESIS  11/04/2023   IR PARACENTESIS  12/23/2023   IR PARACENTESIS  02/04/2024   IR PARACENTESIS  03/23/2024   IR PARACENTESIS  04/06/2024   IR PARACENTESIS  05/27/2024     reports that she has never smoked. She has never been exposed to tobacco smoke. She has never used smokeless tobacco. She reports that she does not currently use alcohol . She reports that she does not use drugs.  Allergies[1]  Family History  Problem Relation Age of Onset   Asthma Mother    Carpal tunnel syndrome Mother    Gout Father    Diabetes Paternal Grandmother    Anesthesia problems Neg Hx     Prior to Admission medications  Medication Sig Start Date End Date Taking? Authorizing Provider  albuterol  (VENTOLIN  HFA) 108 (90 Base) MCG/ACT inhaler Inhale 2 puffs into the lungs every 4 (four) hours as needed for wheezing or shortness of breath. 11/24/22   [provider]  bumetanide  (BUMEX ) 2 MG tablet Take 10 mg by mouth daily.    [provider]  carvedilol  (COREG ) 25 MG tablet Take 1 tablet (25 mg total) by mouth 2 (two) times daily with a meal. Follow with your PCP for refills. 05/26/24 06/25/24  Patel, Pranav M, MD  Cinnamon 500 MG capsule Take 500 mg by mouth in the morning.    [provider]  diazePAM , 15 MG Dose, 2 x 7.5 MG/0.1ML LQPK Place 15 mg into the nose as needed (sz lasting over 2 minutes). 04/28/24   Yadav, Priyanka O, MD  dicyclomine  (BENTYL ) 10 MG capsule Take 1 capsule (10 mg total) by mouth every 8 (eight) hours as needed for spasms. 05/26/24   Tobie Yetta HERO, MD  DULoxetine  (CYMBALTA ) 20 MG capsule Take 1 capsule (20 mg total) by mouth daily. 05/04/24   Ghimire, Donalda HERO, MD  Elderberry-Vitamin C-Zinc  (ELDERBERRY IMMUNE HEALTH GUMMY PO) Take 1 tablet by mouth daily.    [provider]  hydrOXYzine  (ATARAX ) 25 MG tablet Take 1 tablet  (25 mg total) by mouth 3 (three) times daily as needed for anxiety, itching, nausea or vomiting. 05/04/24   Ghimire, Donalda HERO, MD  insulin  aspart (NOVOLOG ) 100 UNIT/ML FlexPen Inject 2 Units into the skin 3 (three) times daily with meals. 05/10/24   Cindy Garnette POUR, MD  lacosamide  (VIMPAT ) 50 MG TABS tablet Take 2 tablets (100 mg total) by mouth 2 (two) times daily. Take additional 1 tablet (50 mg) after dialysis on Tuesdays, Thursdays and Saturdays 05/04/24   Raenelle Donalda HERO, MD  LANTUS  SOLOSTAR 100 UNIT/ML Solostar Pen Inject 5 Units into the skin daily. 04/18/24   Perri DELENA Meliton Mickey., MD  metoCLOPramide  (REGLAN ) 5 MG tablet Take 1 tablet (5 mg total) by mouth every 8 (eight) hours as needed for nausea. 05/26/24 05/26/25  Tobie Yetta HERO, MD  OLANZapine  (ZYPREXA ) 5 MG tablet Take 1 tablet (5 mg total) by mouth at bedtime. 05/04/24   Ghimire, Donalda HERO, MD  Pancrelipase , Lip-Prot-Amyl, 3000-9500 units CPEP Take 3,000 Units by  mouth in the morning, at noon, and at bedtime.    [provider]  pantoprazole  (PROTONIX ) 40 MG tablet Take 1 tablet (40 mg total) by mouth at bedtime. 05/26/24 06/09/24  Tobie Yetta HERO, MD  sevelamer  carbonate (RENVELA ) 800 MG tablet Take 1 tablet (800 mg total) by mouth 3 (three) times daily with meals. 02/07/24   Drusilla Sabas RAMAN, MD  SUMAtriptan  (IMITREX ) 50 MG tablet Take 50 mg by mouth every 2 (two) hours as needed for migraine or headache. 06/20/22   [provider]  VELTASSA  16.8 g PACK Take 1 packet by mouth daily at 12 noon. 04/03/24   [provider]    Physical Exam: Constitutional: Moderately built and nourished. Vitals:   06/05/24 0315 06/05/24 0321 06/05/24 0354 06/05/24 0427  BP:   (!) 181/111   Pulse:   97   Resp:   (!) 21   Temp:  (!) 97.4 F (36.3 C)  97.7 F (36.5 C)  TempSrc:  Axillary  Oral  SpO2: 99%  100%    Eyes: Anicteric no pallor. ENMT: No discharge from ears eyes nose or mouth. Neck: No mass felt.  No neck  rigidity. Respiratory: No rhonchi or crepitations. Cardiovascular: S1-S2 heard. Abdomen: Soft nontender bowel sound present. Musculoskeletal: No edema. Skin: No rash. Neurologic: Alert awake oriented to time place and person.  Moves all extremities. Psychiatric: Appears normal.  Normal affect.   Labs on Admission: I have personally reviewed following labs and imaging studies  CBC: Recent Labs  Lab 05/30/24 1657 05/30/24 1828 05/31/24 0358 06/02/24 0929 06/04/24 2349 06/04/24 2353  WBC 12.5*  --  15.5* 14.2* 15.6*  --   NEUTROABS 5.0  --   --   --   --   --   HGB 9.0* 10.2*  9.9* 8.0* 8.6* 8.3* 9.9*  HCT 29.2* 30.0*  29.0* 25.7* 28.1* 27.5* 29.0*  MCV 88.2  --  87.7 88.4 90.2  --   PLT 250  --  247 218 215  --    Basic Metabolic Panel: Recent Labs  Lab 05/30/24 1657 05/30/24 1828 05/31/24 0012 05/31/24 0359 05/31/24 0715 05/31/24 1630 06/01/24 0038 06/02/24 0929 06/04/24 2349 06/04/24 2353 06/05/24 0319  NA 137   < > 141 141   < > 137 139 141 128* 129* 133*  K >7.5*   < > 6.7* 6.0*   < > 5.2* 4.4 4.8 6.5* 6.3* 5.4*  CL 103   < > 104 106   < > 100 103 104 94*  --  97*  CO2 22   < > 23 22   < > 21* 20* 23 19*  --  22  GLUCOSE 511*   < > 109* 122*   < > 370* 62* 190* 890*  --  661*  BUN 45*   < > 48* 43*   < > 32* 28* 34* 39*  --  39*  CREATININE 6.60*   < > 6.76* 5.87*   < > 4.13* 3.58* 4.31* 4.97*  --  5.06*  CALCIUM  8.4*   < > 8.7* 8.1*   < > 8.9 8.8* 8.7* 8.5*  --  8.9  MG 2.6*  --  2.6*  --   --   --  2.2  --   --   --   --   PHOS  --   --  6.7* 5.9*  --  4.2 3.8 4.7*  --   --   --    < > =  values in this interval not displayed.   GFR: Estimated Creatinine Clearance: 16.7 mL/min (A) (by C-G formula based on SCr of 5.06 mg/dL (H)). Liver Function Tests: Recent Labs  Lab 05/30/24 1657 05/31/24 0012 05/31/24 0359 05/31/24 1630 06/01/24 0038 06/02/24 0929 06/04/24 2349  AST 107* 111*  --   --   --   --  76*  ALT 49* 58*  --   --   --   --  48*   ALKPHOS 465* 501*  --   --   --   --  395*  BILITOT 0.3 0.3  --   --   --   --  0.4  PROT 5.2* 5.5*  --   --   --   --  5.6*  ALBUMIN  3.4* 3.7 3.4* 3.4* 3.2* 3.7 3.5   No results for input(s): LIPASE, AMYLASE in the last 168 hours. No results for input(s): AMMONIA in the last 168 hours. Coagulation Profile: Recent Labs  Lab 05/30/24 2036  INR 1.3*   Cardiac Enzymes: No results for input(s): CKTOTAL, CKMB, CKMBINDEX, TROPONINI in the last 168 hours. BNP (last 3 results) Recent Labs    05/20/24 1632 05/24/24 2237 05/25/24 0301  PROBNP >35,000.0* >35,000.0* >35,000.0*   HbA1C: No results for input(s): HGBA1C in the last 72 hours. CBG: Recent Labs  Lab 06/03/24 0754 06/03/24 1519 06/04/24 2319 06/05/24 0311 06/05/24 0349  GLUCAP 204* 176* >600* 548* 521*   Lipid Profile: No results for input(s): CHOL, HDL, LDLCALC, TRIG, CHOLHDL, LDLDIRECT in the last 72 hours. Thyroid  Function Tests: No results for input(s): TSH, T4TOTAL, FREET4, T3FREE, THYROIDAB in the last 72 hours. Anemia Panel: No results for input(s): VITAMINB12, FOLATE, FERRITIN, TIBC, IRON , RETICCTPCT in the last 72 hours. Urine analysis:    Component Value Date/Time   COLORURINE YELLOW 04/13/2024 1749   APPEARANCEUR HAZY (A) 04/13/2024 1749   APPEARANCEUR Hazy 11/10/2013 2043   LABSPEC 1.018 04/13/2024 1749   LABSPEC 1.031 11/10/2013 2043   PHURINE 7.0 04/13/2024 1749   GLUCOSEU >=500 (A) 04/13/2024 1749   GLUCOSEU >=500 11/10/2013 2043   HGBUR NEGATIVE 04/13/2024 1749   BILIRUBINUR NEGATIVE 04/13/2024 1749   BILIRUBINUR negative 06/04/2018 1035   BILIRUBINUR Negative 11/24/2015 1443   BILIRUBINUR Negative 11/10/2013 2043   KETONESUR 5 (A) 04/13/2024 1749   PROTEINUR >=300 (A) 04/13/2024 1749   UROBILINOGEN 0.2 09/14/2019 1732   NITRITE NEGATIVE 04/13/2024 1749   LEUKOCYTESUR NEGATIVE 04/13/2024 1749   LEUKOCYTESUR 1+ 11/10/2013 2043   Sepsis  Labs: @LABRCNTIP (procalcitonin:4,lacticidven:4) ) Recent Results (from the past 240 hours)  Body fluid culture w Gram Stain     Status: None   Collection Time: 05/27/24 11:34 AM   Specimen: Abdomen; Peritoneal Fluid  Result Value Ref Range Status   Specimen Description PERITONEAL  Final   Special Requests NONE  Final   Gram Stain NO WBC SEEN NO ORGANISMS SEEN   Final   Culture   Final    NO GROWTH 3 DAYS Performed at [facility], [address], [place], [place] [address]    Report Status 05/30/2024 FINAL  Final  MRSA Next Gen by PCR, Nasal     Status: None   Collection Time: 05/30/24 11:30 PM   Specimen: Nasal Mucosa; Nasal Swab  Result Value Ref Range Status   MRSA by PCR Next Gen NOT DETECTED NOT DETECTED Final    Comment: (NOTE) The GeneXpert MRSA Assay (FDA approved for NASAL specimens only), is one component of a comprehensive  MRSA colonization surveillance program. It is not intended to diagnose MRSA infection nor to guide or monitor treatment for MRSA infections. Test performance is not FDA approved in patients less than 77 years old. Performed at [facility], [address], [place], [place] [address]      Radiological Exams on Admission: DG Chest 2 View Result Date: 06/05/2024 EXAM: 2 VIEW(S) XRAY OF THE CHEST 06/05/2024 01:35:00 AM COMPARISON: 05/31/2024 CLINICAL HISTORY: [id number] Shortness of breath. FINDINGS: LINES, TUBES AND DEVICES: Right internal jugular central venous catheter removed. LUNGS AND PLEURA: No focal pulmonary opacity. No pleural effusion. No pneumothorax. HEART AND MEDIASTINUM: Unchanged, enlarged cardiomediastinal silhouette. BONES AND SOFT TISSUES: No acute osseous abnormality. IMPRESSION: 1. No acute cardiopulmonary abnormality. 2. Stable enlarged cardiomediastinal silhouette. Electronically signed by: Norman Gatlin MD 06/05/2024 01:41 AM EST RP Workstation: [id number]    EKG: Independently reviewed.  Normal sinus  rhythm.  Assessment/Plan Principal Problem:   Hyperosmolar hyperglycemic state (HHS) (HCC) Active Problems:   DKA (diabetic ketoacidosis) (HCC)   ESRD on hemodialysis (HCC)   Anemia due to chronic kidney disease   Bipolar disorder (HCC)   Normocytic anemia   Type 1 diabetes mellitus with hyperglycemia (HCC)   Seizures (HCC)    Hyperosmolar nonketotic uncontrolled diabetes mellitus type 1 last hemoglobin A1c was 8.8 about 1 month ago.  Patient has very brittle diabetes.  Presently on insulin  infusion.  Closely monitor metabolic panel changed to long-acting insulin  once blood sugars less than 250. ESRD on hemodialysis on Tuesday Thursday Saturday missed dialysis yesterday.  Was hyperkalemic with potassium of 6.5 improved to 5.4 after insulin  was started.  Patient also was given 1 dose of calcium  gluconate.  Consult nephrology for dialysis. Hypertension uncontrolled on carvedilol  and Bumex .  Follow blood pressure trends.  I think it would improve with dialysis. History of depression on Zyprexa  and duloxetine . History of seizures on Vimpat . Anemia likely from renal disease follow CBC. GERD on PPI.   DVT prophylaxis: Heparin . Code Status: Full code. Family Communication: Discussed with patient. Disposition Plan: Progressive care. Consults called: Consult nephrology in the morning for dialysis. Admission status: Inpatient.         [1]  Allergies Allergen Reactions   Keflex  [Cephalexin ] Anaphylaxis    Ceftriaxone  in the past with no reaction   Penicillins Anaphylaxis, Hives and Rash   Vibramycin  [Doxycycline ] Anaphylaxis   Benadryl  [Diphenhydramine ] Itching   Entresto  [Sacubitril -Valsartan ] Swelling    angioedema   Dilaudid  [Hydromorphone ] Itching   Losartan  Dermatitis   Methotrexate And Trimetrexate Rash   Roxicodone  [Oxycodone ] Itching    Takes Percocet without issue   "

## 2024-06-05 NOTE — ED Notes (Signed)
 While signing AMA form patient states, when you have children nothing else matters.
